# Patient Record
Sex: Female | Born: 1965 | Race: Black or African American | Hispanic: No | Marital: Single | Smoking: Former smoker
Health system: Southern US, Community
[De-identification: ages and names within clinical notes are randomized; demographics above are authoritative.]

## PROBLEM LIST (undated history)

## (undated) DIAGNOSIS — R7881 Bacteremia: Secondary | ICD-10-CM

## (undated) DIAGNOSIS — M797 Fibromyalgia: Secondary | ICD-10-CM

## (undated) DIAGNOSIS — M4802 Spinal stenosis, cervical region: Secondary | ICD-10-CM

## (undated) DIAGNOSIS — I509 Heart failure, unspecified: Secondary | ICD-10-CM

## (undated) DIAGNOSIS — Z8719 Personal history of other diseases of the digestive system: Secondary | ICD-10-CM

## (undated) DIAGNOSIS — K219 Gastro-esophageal reflux disease without esophagitis: Secondary | ICD-10-CM

## (undated) DIAGNOSIS — I4891 Unspecified atrial fibrillation: Secondary | ICD-10-CM

## (undated) DIAGNOSIS — D509 Iron deficiency anemia, unspecified: Secondary | ICD-10-CM

## (undated) DIAGNOSIS — Z9289 Personal history of other medical treatment: Secondary | ICD-10-CM

## (undated) DIAGNOSIS — I499 Cardiac arrhythmia, unspecified: Secondary | ICD-10-CM

## (undated) DIAGNOSIS — F329 Major depressive disorder, single episode, unspecified: Secondary | ICD-10-CM

## (undated) DIAGNOSIS — H5440 Blindness, one eye, unspecified eye: Secondary | ICD-10-CM

## (undated) DIAGNOSIS — A6000 Herpesviral infection of urogenital system, unspecified: Secondary | ICD-10-CM

## (undated) DIAGNOSIS — F32A Depression, unspecified: Secondary | ICD-10-CM

## (undated) DIAGNOSIS — F419 Anxiety disorder, unspecified: Secondary | ICD-10-CM

## (undated) DIAGNOSIS — N186 End stage renal disease: Secondary | ICD-10-CM

## (undated) DIAGNOSIS — Z8669 Personal history of other diseases of the nervous system and sense organs: Secondary | ICD-10-CM

## (undated) DIAGNOSIS — M545 Low back pain, unspecified: Secondary | ICD-10-CM

## (undated) DIAGNOSIS — H409 Unspecified glaucoma: Secondary | ICD-10-CM

## (undated) DIAGNOSIS — G8929 Other chronic pain: Secondary | ICD-10-CM

## (undated) DIAGNOSIS — M8580 Other specified disorders of bone density and structure, unspecified site: Secondary | ICD-10-CM

## (undated) DIAGNOSIS — H547 Unspecified visual loss: Secondary | ICD-10-CM

## (undated) DIAGNOSIS — J189 Pneumonia, unspecified organism: Secondary | ICD-10-CM

## (undated) DIAGNOSIS — Z8711 Personal history of peptic ulcer disease: Secondary | ICD-10-CM

## (undated) DIAGNOSIS — K3184 Gastroparesis: Secondary | ICD-10-CM

## (undated) DIAGNOSIS — T8859XA Other complications of anesthesia, initial encounter: Secondary | ICD-10-CM

## (undated) DIAGNOSIS — R51 Headache: Secondary | ICD-10-CM

## (undated) DIAGNOSIS — I639 Cerebral infarction, unspecified: Secondary | ICD-10-CM

## (undated) DIAGNOSIS — M5030 Other cervical disc degeneration, unspecified cervical region: Secondary | ICD-10-CM

## (undated) DIAGNOSIS — T4145XA Adverse effect of unspecified anesthetic, initial encounter: Secondary | ICD-10-CM

## (undated) DIAGNOSIS — R569 Unspecified convulsions: Secondary | ICD-10-CM

## (undated) HISTORY — PX: APPENDECTOMY: SHX54

## (undated) HISTORY — PX: KIDNEY TRANSPLANT: SHX239

## (undated) HISTORY — DX: Unspecified visual loss: H54.7

## (undated) HISTORY — DX: Other cervical disc degeneration, unspecified cervical region: M50.30

## (undated) HISTORY — PX: COLONOSCOPY: SHX174

## (undated) HISTORY — DX: Heart failure, unspecified: I50.9

## (undated) HISTORY — PX: TONSILLECTOMY: SUR1361

## (undated) HISTORY — PX: TOTAL NEPHRECTOMY: SHX415

## (undated) HISTORY — PX: CATARACT EXTRACTION: SUR2

## (undated) HISTORY — PX: MULTIPLE TOOTH EXTRACTIONS: SHX2053

## (undated) HISTORY — DX: Gastroparesis: K31.84

---
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
## 1992-02-03 DIAGNOSIS — A6000 Herpesviral infection of urogenital system, unspecified: Secondary | ICD-10-CM

## 1992-02-03 DIAGNOSIS — R569 Unspecified convulsions: Secondary | ICD-10-CM

## 1992-02-03 HISTORY — DX: Unspecified convulsions: R56.9

## 1992-02-03 HISTORY — DX: Herpesviral infection of urogenital system, unspecified: A60.00

## 1999-02-03 HISTORY — PX: ENUCLEATION: SHX628

## 1999-03-24 ENCOUNTER — Emergency Department (HOSPITAL_COMMUNITY): Admission: EM | Admit: 1999-03-24 | Discharge: 1999-03-24 | Payer: Self-pay | Admitting: Emergency Medicine

## 1999-04-13 ENCOUNTER — Encounter: Payer: Self-pay | Admitting: Emergency Medicine

## 1999-04-13 ENCOUNTER — Emergency Department (HOSPITAL_COMMUNITY): Admission: EM | Admit: 1999-04-13 | Discharge: 1999-04-13 | Payer: Self-pay | Admitting: Emergency Medicine

## 1999-05-02 ENCOUNTER — Inpatient Hospital Stay (HOSPITAL_COMMUNITY): Admission: EM | Admit: 1999-05-02 | Discharge: 1999-05-06 | Payer: Self-pay | Admitting: Emergency Medicine

## 1999-05-02 ENCOUNTER — Encounter: Payer: Self-pay | Admitting: Emergency Medicine

## 1999-05-13 ENCOUNTER — Encounter: Payer: Self-pay | Admitting: Emergency Medicine

## 1999-05-13 ENCOUNTER — Emergency Department (HOSPITAL_COMMUNITY): Admission: EM | Admit: 1999-05-13 | Discharge: 1999-05-13 | Payer: Self-pay

## 1999-05-17 ENCOUNTER — Ambulatory Visit (HOSPITAL_COMMUNITY): Admission: RE | Admit: 1999-05-17 | Discharge: 1999-05-17 | Payer: Self-pay | Admitting: *Deleted

## 1999-05-30 ENCOUNTER — Inpatient Hospital Stay (HOSPITAL_COMMUNITY): Admission: EM | Admit: 1999-05-30 | Discharge: 1999-06-11 | Payer: Self-pay | Admitting: *Deleted

## 1999-05-30 ENCOUNTER — Encounter (INDEPENDENT_AMBULATORY_CARE_PROVIDER_SITE_OTHER): Payer: Self-pay | Admitting: *Deleted

## 1999-06-06 ENCOUNTER — Encounter: Payer: Self-pay | Admitting: Nephrology

## 1999-06-10 ENCOUNTER — Encounter: Payer: Self-pay | Admitting: Nephrology

## 1999-06-13 ENCOUNTER — Emergency Department (HOSPITAL_COMMUNITY): Admission: EM | Admit: 1999-06-13 | Discharge: 1999-06-13 | Payer: Self-pay | Admitting: Emergency Medicine

## 1999-07-10 ENCOUNTER — Encounter: Payer: Self-pay | Admitting: Ophthalmology

## 1999-07-10 ENCOUNTER — Ambulatory Visit (HOSPITAL_COMMUNITY): Admission: RE | Admit: 1999-07-10 | Discharge: 1999-07-11 | Payer: Self-pay | Admitting: Ophthalmology

## 1999-09-30 ENCOUNTER — Emergency Department (HOSPITAL_COMMUNITY): Admission: EM | Admit: 1999-09-30 | Discharge: 1999-10-01 | Payer: Self-pay

## 1999-10-15 ENCOUNTER — Other Ambulatory Visit: Admission: RE | Admit: 1999-10-15 | Discharge: 1999-10-15 | Payer: Self-pay | Admitting: *Deleted

## 1999-11-04 ENCOUNTER — Ambulatory Visit (HOSPITAL_COMMUNITY): Admission: RE | Admit: 1999-11-04 | Discharge: 1999-11-04 | Payer: Self-pay | Admitting: Nephrology

## 1999-11-11 ENCOUNTER — Ambulatory Visit (HOSPITAL_COMMUNITY): Admission: RE | Admit: 1999-11-11 | Discharge: 1999-11-11 | Payer: Self-pay | Admitting: Nephrology

## 1999-11-12 ENCOUNTER — Encounter: Payer: Self-pay | Admitting: Nephrology

## 1999-11-12 ENCOUNTER — Ambulatory Visit (HOSPITAL_COMMUNITY): Admission: RE | Admit: 1999-11-12 | Discharge: 1999-11-12 | Payer: Self-pay | Admitting: Nephrology

## 1999-11-13 ENCOUNTER — Emergency Department (HOSPITAL_COMMUNITY): Admission: EM | Admit: 1999-11-13 | Discharge: 1999-11-13 | Payer: Self-pay | Admitting: Emergency Medicine

## 1999-12-23 ENCOUNTER — Ambulatory Visit (HOSPITAL_COMMUNITY): Admission: RE | Admit: 1999-12-23 | Discharge: 1999-12-23 | Payer: Self-pay | Admitting: Nephrology

## 2000-01-06 ENCOUNTER — Ambulatory Visit (HOSPITAL_COMMUNITY): Admission: RE | Admit: 2000-01-06 | Discharge: 2000-01-06 | Payer: Self-pay | Admitting: Nephrology

## 2000-01-07 ENCOUNTER — Ambulatory Visit (HOSPITAL_COMMUNITY): Admission: RE | Admit: 2000-01-07 | Discharge: 2000-01-07 | Payer: Self-pay | Admitting: *Deleted

## 2000-01-07 ENCOUNTER — Encounter (HOSPITAL_COMMUNITY): Admission: RE | Admit: 2000-01-07 | Discharge: 2000-04-06 | Payer: Self-pay | Admitting: *Deleted

## 2000-01-08 ENCOUNTER — Ambulatory Visit (HOSPITAL_COMMUNITY): Admission: RE | Admit: 2000-01-08 | Discharge: 2000-01-08 | Payer: Self-pay | Admitting: *Deleted

## 2000-01-09 ENCOUNTER — Emergency Department (HOSPITAL_COMMUNITY): Admission: EM | Admit: 2000-01-09 | Discharge: 2000-01-10 | Payer: Self-pay | Admitting: Emergency Medicine

## 2000-01-09 ENCOUNTER — Emergency Department (HOSPITAL_COMMUNITY): Admission: EM | Admit: 2000-01-09 | Discharge: 2000-01-09 | Payer: Self-pay | Admitting: Emergency Medicine

## 2000-02-17 ENCOUNTER — Ambulatory Visit (HOSPITAL_COMMUNITY): Admission: RE | Admit: 2000-02-17 | Discharge: 2000-02-17 | Payer: Self-pay | Admitting: *Deleted

## 2000-02-18 ENCOUNTER — Ambulatory Visit (HOSPITAL_COMMUNITY): Admission: RE | Admit: 2000-02-18 | Discharge: 2000-02-18 | Payer: Self-pay | Admitting: *Deleted

## 2000-02-18 ENCOUNTER — Encounter: Payer: Self-pay | Admitting: *Deleted

## 2000-04-21 ENCOUNTER — Ambulatory Visit (HOSPITAL_COMMUNITY): Admission: RE | Admit: 2000-04-21 | Discharge: 2000-04-21 | Payer: Self-pay | Admitting: Cardiology

## 2000-04-21 ENCOUNTER — Encounter: Payer: Self-pay | Admitting: Cardiology

## 2000-06-01 ENCOUNTER — Ambulatory Visit (HOSPITAL_COMMUNITY): Admission: RE | Admit: 2000-06-01 | Discharge: 2000-06-01 | Payer: Self-pay | Admitting: Nephrology

## 2000-06-08 ENCOUNTER — Encounter: Admission: RE | Admit: 2000-06-08 | Discharge: 2000-06-08 | Payer: Self-pay | Admitting: *Deleted

## 2000-07-05 ENCOUNTER — Ambulatory Visit (HOSPITAL_COMMUNITY): Admission: RE | Admit: 2000-07-05 | Discharge: 2000-07-05 | Payer: Self-pay | Admitting: Nephrology

## 2000-07-07 ENCOUNTER — Encounter: Payer: Self-pay | Admitting: Nephrology

## 2000-07-07 ENCOUNTER — Ambulatory Visit (HOSPITAL_COMMUNITY): Admission: RE | Admit: 2000-07-07 | Discharge: 2000-07-07 | Payer: Self-pay | Admitting: Nephrology

## 2000-08-17 ENCOUNTER — Encounter: Admission: RE | Admit: 2000-08-17 | Discharge: 2000-08-17 | Payer: Self-pay | Admitting: Nephrology

## 2000-08-17 ENCOUNTER — Encounter: Payer: Self-pay | Admitting: Nephrology

## 2000-09-14 ENCOUNTER — Encounter (HOSPITAL_COMMUNITY): Admission: RE | Admit: 2000-09-14 | Discharge: 2000-12-13 | Payer: Self-pay | Admitting: Nephrology

## 2000-09-21 ENCOUNTER — Ambulatory Visit (HOSPITAL_COMMUNITY): Admission: RE | Admit: 2000-09-21 | Discharge: 2000-09-21 | Payer: Self-pay | Admitting: Nephrology

## 2000-09-21 ENCOUNTER — Encounter: Payer: Self-pay | Admitting: Nephrology

## 2000-10-09 ENCOUNTER — Emergency Department (HOSPITAL_COMMUNITY): Admission: EM | Admit: 2000-10-09 | Discharge: 2000-10-10 | Payer: Self-pay | Admitting: Emergency Medicine

## 2000-10-13 ENCOUNTER — Encounter: Payer: Self-pay | Admitting: Emergency Medicine

## 2000-10-13 ENCOUNTER — Inpatient Hospital Stay (HOSPITAL_COMMUNITY): Admission: EM | Admit: 2000-10-13 | Discharge: 2000-10-15 | Payer: Self-pay | Admitting: Emergency Medicine

## 2000-10-13 ENCOUNTER — Ambulatory Visit (HOSPITAL_COMMUNITY): Admission: RE | Admit: 2000-10-13 | Discharge: 2000-10-13 | Payer: Self-pay | Admitting: Critical Care Medicine

## 2000-10-14 ENCOUNTER — Encounter: Payer: Self-pay | Admitting: Nephrology

## 2000-10-29 ENCOUNTER — Encounter: Payer: Self-pay | Admitting: Emergency Medicine

## 2000-10-29 ENCOUNTER — Emergency Department (HOSPITAL_COMMUNITY): Admission: EM | Admit: 2000-10-29 | Discharge: 2000-10-29 | Payer: Self-pay | Admitting: Emergency Medicine

## 2000-10-30 ENCOUNTER — Emergency Department (HOSPITAL_COMMUNITY): Admission: EM | Admit: 2000-10-30 | Discharge: 2000-10-30 | Payer: Self-pay | Admitting: Emergency Medicine

## 2000-11-04 ENCOUNTER — Encounter: Payer: Self-pay | Admitting: Nephrology

## 2000-11-04 ENCOUNTER — Inpatient Hospital Stay (HOSPITAL_COMMUNITY): Admission: AD | Admit: 2000-11-04 | Discharge: 2000-11-09 | Payer: Self-pay | Admitting: Nephrology

## 2000-11-05 ENCOUNTER — Encounter: Payer: Self-pay | Admitting: Nephrology

## 2000-11-07 ENCOUNTER — Encounter: Payer: Self-pay | Admitting: Nephrology

## 2000-11-09 ENCOUNTER — Encounter: Payer: Self-pay | Admitting: Nephrology

## 2000-12-21 ENCOUNTER — Emergency Department (HOSPITAL_COMMUNITY): Admission: EM | Admit: 2000-12-21 | Discharge: 2000-12-22 | Payer: Self-pay | Admitting: Emergency Medicine

## 2001-03-04 ENCOUNTER — Ambulatory Visit (HOSPITAL_COMMUNITY): Admission: RE | Admit: 2001-03-04 | Discharge: 2001-03-04 | Payer: Self-pay | Admitting: Nephrology

## 2001-03-04 ENCOUNTER — Encounter (HOSPITAL_COMMUNITY): Admission: RE | Admit: 2001-03-04 | Discharge: 2001-06-02 | Payer: Self-pay | Admitting: Nephrology

## 2001-03-21 ENCOUNTER — Ambulatory Visit (HOSPITAL_COMMUNITY): Admission: RE | Admit: 2001-03-21 | Discharge: 2001-03-21 | Payer: Self-pay | Admitting: Nephrology

## 2001-03-21 ENCOUNTER — Encounter: Payer: Self-pay | Admitting: Nephrology

## 2001-07-30 ENCOUNTER — Inpatient Hospital Stay (HOSPITAL_COMMUNITY): Admission: AD | Admit: 2001-07-30 | Discharge: 2001-08-01 | Payer: Self-pay | Admitting: Nephrology

## 2001-08-23 IMAGING — XA IR [PERSON_NAME]/SVC
4 series · 14 of 24 positions shown · IV contrast (omnipaque)
Comparison: none

FINDINGS
CLINICAL DATA: 33-YEAR-OLD WITH END STAGE RENAL DISEASE AND EXCEEDINGLY POOR DIALYSIS ACCESS.  DR.
MUMN INITIALLY ATTEMPTED TO PLACE A RIGHT JUGULAR DIALYSIS CATHETER.  WE WERE THEN REQUESTED
TO PERFORM CENTRAL VENOGRAPHY AND PLACE A DIALYSIS CATHETER.
FLUORO GUIDANCE:
FLUOROSCOPY WAS PROVIDED TO DR. MUMN IN THE ATTEMPTED PLACEMENT OF THE RIGHT JUGULAR DIALYSIS
CATHETER.  HE OBTAINED SEVERAL SPOT FILM IMAGES DURING PLACEMENT WHICH INITIALLY DEMONSTRATED A
SMALL AMOUNT OF EXTRAVASATION INTO THE MEDIASTINUM.  HE ALSO SUBMITTED IMAGES WITH HIS DILATOR TIP
IN WHAT APPEARS TO BE AN AZYGOUS COLLATERAL.
IMPRESSION
FLUOROSCOPY FOR ATTEMPTED DIALYSIS CATHETER PLACEMENT.  SPOT FILM IMAGES DEMONSTRATE THE DILATOR IN
WHAT APPEARS TO BE AN AZYGOUS COLLATERAL, SUGGESTING SUPERIOR VENA CAVA OCCLUSION.
SUPERIOR VENA CAVOGRAPHY:
THAT THE RIGHT INTERNAL JUGULAR VEIN WAS INDEED PATENT.  USING THE USUAL STERILE TECHNIQUE AND 1%
LIDOCAINE AS LOCAL ANESTHETIC, THE RIGHT INTERNAL JUGULAR VEIN WAS PUNCTURED UNDER DIRECT
ULTRASOUND VISUALIZATION.  THE GUIDE WIRE PREFERENTIALLY WENT CEPHALAD IN THE RIGHT INTERNAL
JUGULAR VEIN.  I USED THE 3 FRENCH INNER DILATOR FROM THE MICROPUNCTURE TRANSITIONAL DILATOR AND
PLACED THIS INTO THE RIGHT INTERNAL JUGULAR VEIN.  DIGITAL SUBTRACTION SUPERIOR VENA CAVOGRAPHY WAS
PERFORMED DURING INJECTION OF APPROXIMATELY 12 CC OF OMNIPAQUE 300.
THE SUPERIOR VENA CAVA IS INDEED OCCLUDED AT THE UPPER END OF THE PATIENT'S SUPERIOR VENA CAVAL
STENT.  MULTIPLE AZYGOUS AND HEMIAZYGOUS COLLATERALS FILL.  THE RIGHT INNOMINATE VEIN ITSELF IS
PATENT.  I DO NOT IDENTIFY THE LEFT INNOMINATE VEIN.

[Series 1: run · 5 of 22 slices shown (1 of 4)]
[im 1/22]
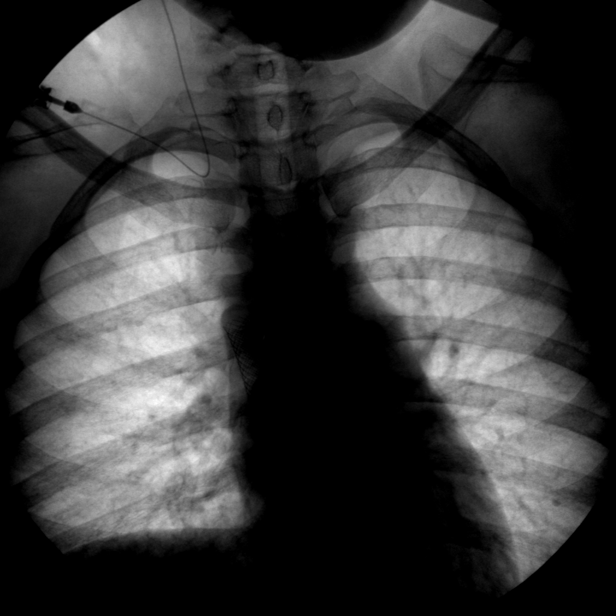
[im 6/22]
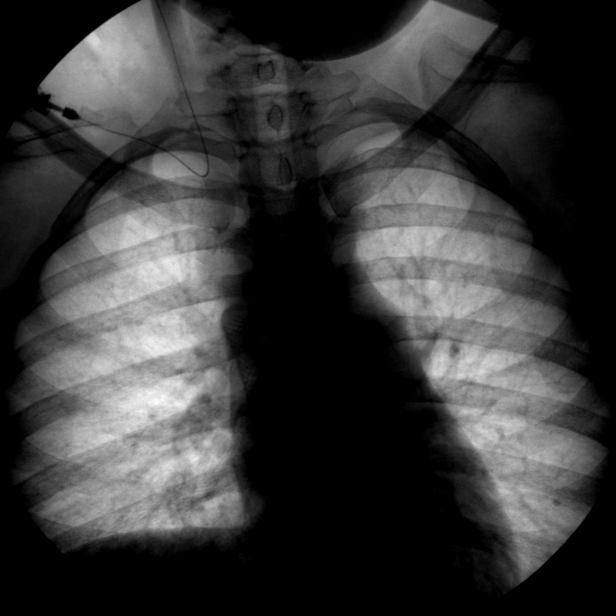
[im 11/22]
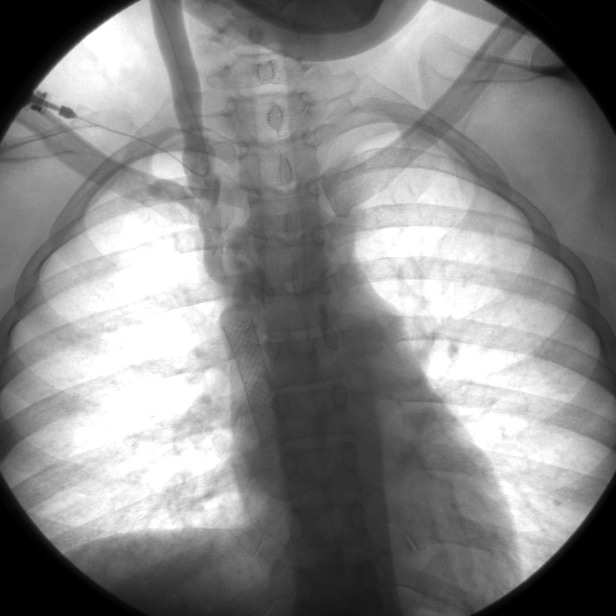
[im 16/22]
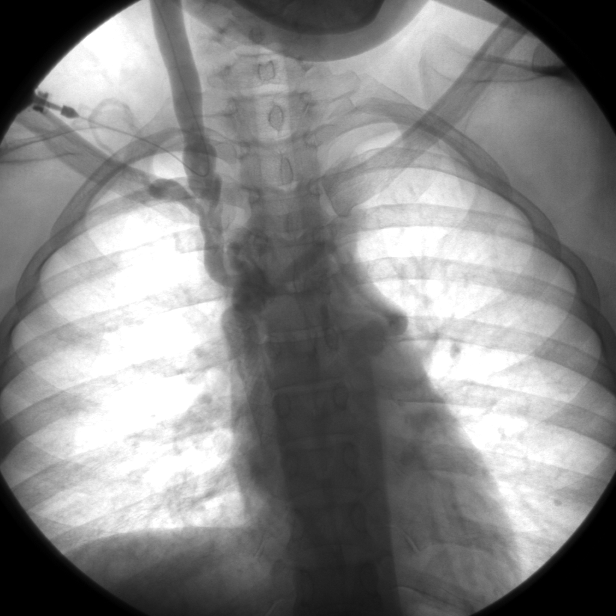
[im 19/22]
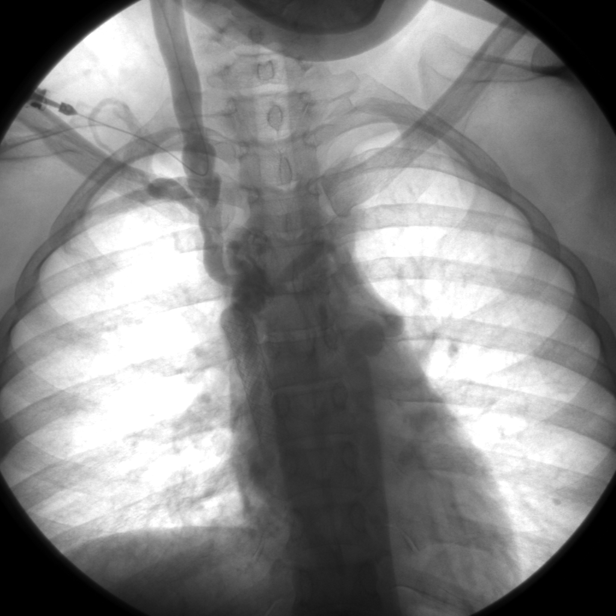

[Series 2: run · 4 of 15 slices shown (2 of 4)]
[im 1/15]
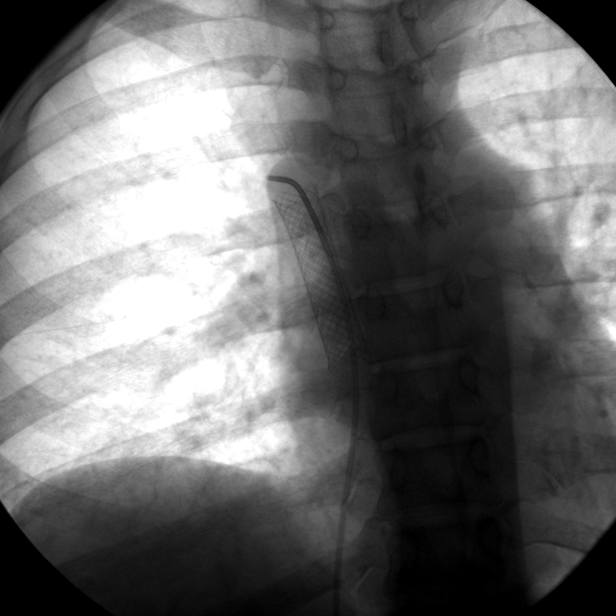
[im 6/15]
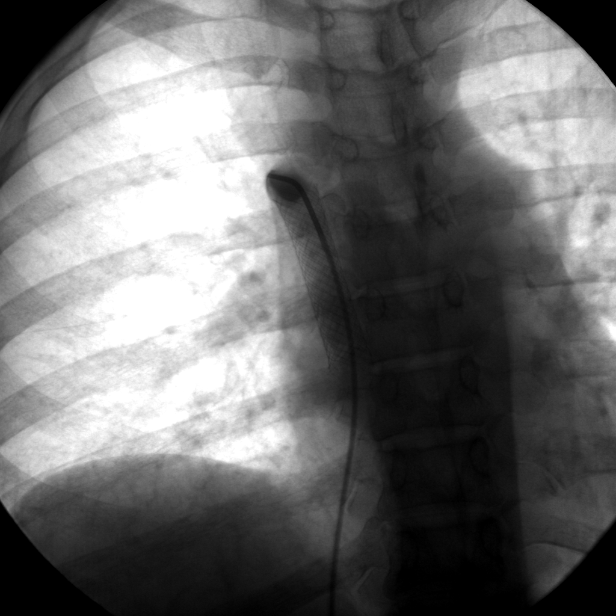
[im 9/15]
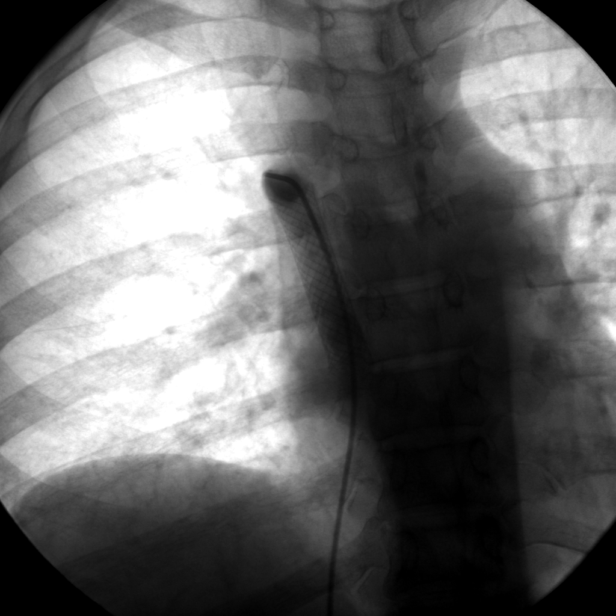
[im 15/15]
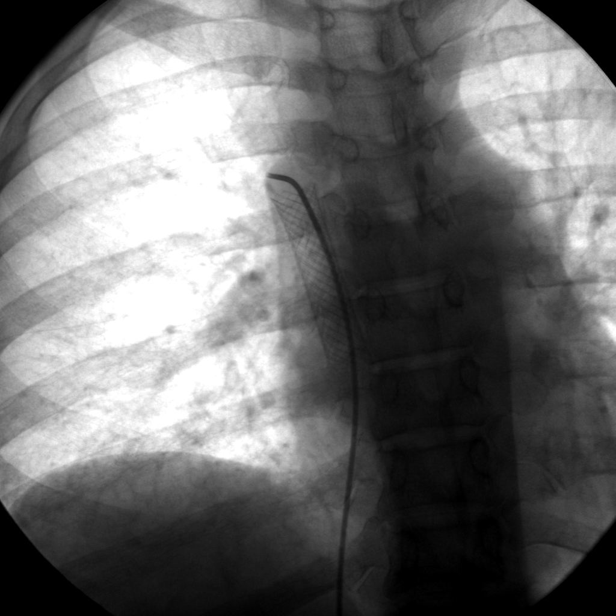

[Series 3: run · 4 of 18 slices shown (3 of 4)]
[im 3/18]
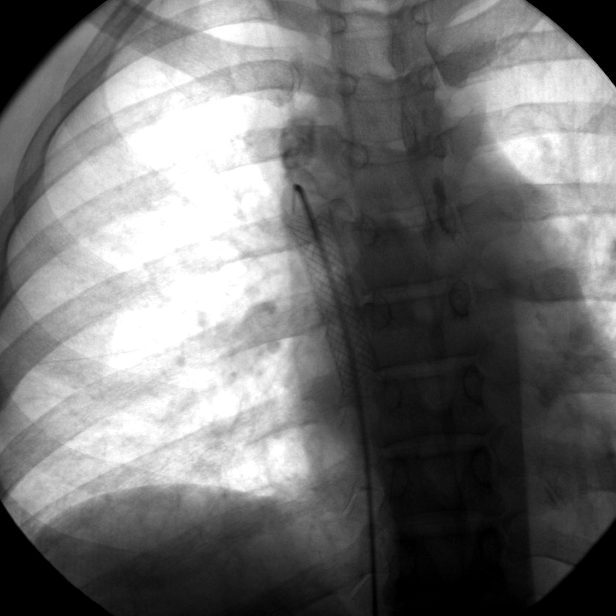
[im 8/18]
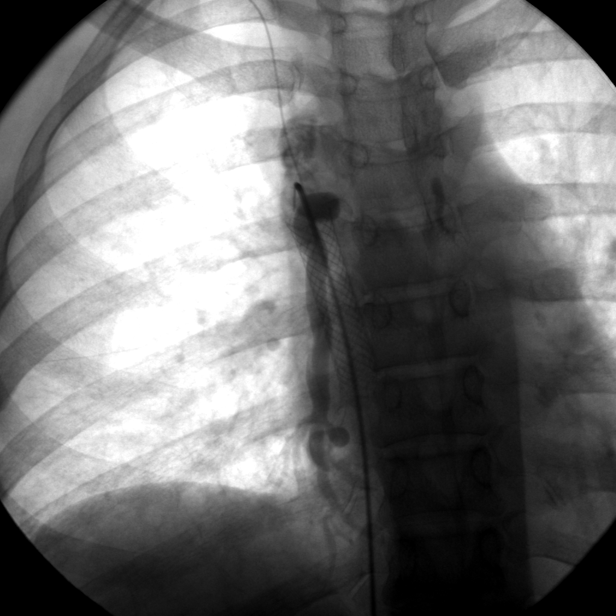
[im 10/18]
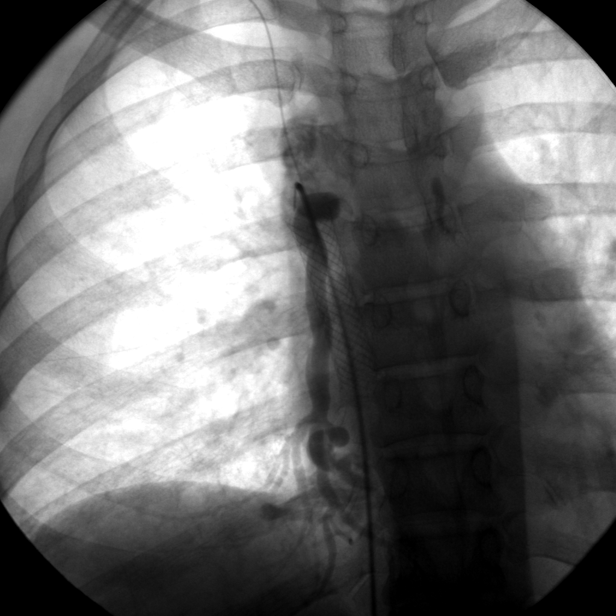
[im 15/18]
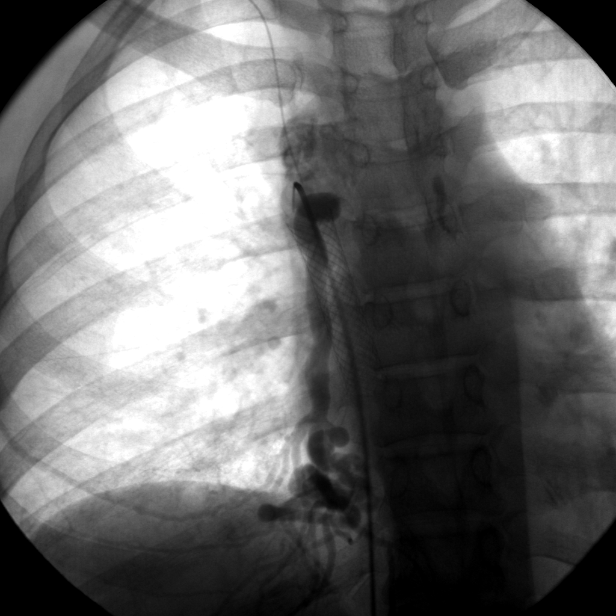

[Series 7: run · 1 of 1 slices shown (4 of 4)]
[im 1/1]
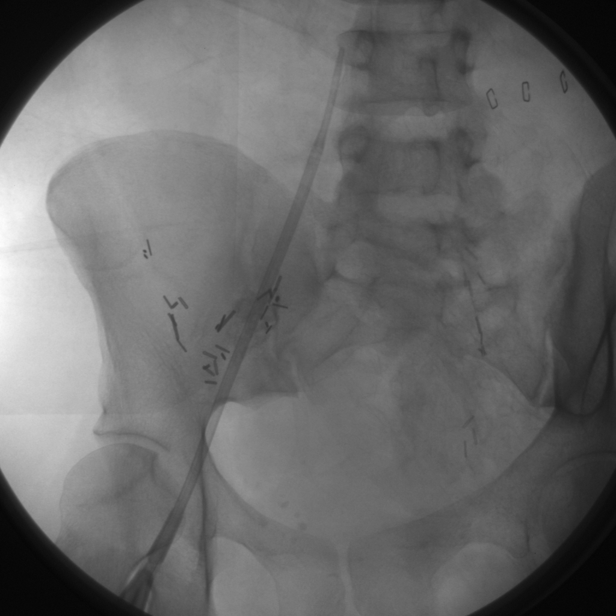

[14 of 24 positions shown; findings below may reference images not displayed]

IMPRESSION: 1.  SUPERIOR VENA CAVA OCCLUSION AT THE LEVEL OF THE UPPER END OF THE CAVAL STENT.
2.  DR. ANNAMMA PLACED A TEMPORARY DIALYSIS CATHETER FROM THE RIGHT COMMON FEMORAL VEIN APPROACH; HE
WILL DICTATE THIS SEPARATELY.

## 2001-09-01 ENCOUNTER — Other Ambulatory Visit: Admission: RE | Admit: 2001-09-01 | Discharge: 2001-09-01 | Payer: Self-pay | Admitting: Obstetrics and Gynecology

## 2001-10-05 ENCOUNTER — Ambulatory Visit (HOSPITAL_COMMUNITY): Admission: RE | Admit: 2001-10-05 | Discharge: 2001-10-05 | Payer: Self-pay | Admitting: Nephrology

## 2001-10-26 ENCOUNTER — Encounter: Payer: Self-pay | Admitting: Nephrology

## 2001-10-26 ENCOUNTER — Encounter: Admission: RE | Admit: 2001-10-26 | Discharge: 2001-10-26 | Payer: Self-pay | Admitting: Nephrology

## 2001-11-16 ENCOUNTER — Ambulatory Visit (HOSPITAL_COMMUNITY): Admission: RE | Admit: 2001-11-16 | Discharge: 2001-11-16 | Payer: Self-pay | Admitting: Nephrology

## 2001-11-21 ENCOUNTER — Encounter: Admission: RE | Admit: 2001-11-21 | Discharge: 2002-01-20 | Payer: Self-pay | Admitting: Nephrology

## 2001-11-30 ENCOUNTER — Ambulatory Visit (HOSPITAL_COMMUNITY): Admission: RE | Admit: 2001-11-30 | Discharge: 2001-11-30 | Payer: Self-pay | Admitting: Nephrology

## 2001-12-21 ENCOUNTER — Ambulatory Visit (HOSPITAL_COMMUNITY): Admission: RE | Admit: 2001-12-21 | Discharge: 2001-12-21 | Payer: Self-pay | Admitting: Nephrology

## 2001-12-21 ENCOUNTER — Encounter: Payer: Self-pay | Admitting: Nephrology

## 2001-12-25 ENCOUNTER — Encounter: Payer: Self-pay | Admitting: Emergency Medicine

## 2001-12-25 ENCOUNTER — Emergency Department (HOSPITAL_COMMUNITY): Admission: EM | Admit: 2001-12-25 | Discharge: 2001-12-25 | Payer: Self-pay | Admitting: Emergency Medicine

## 2002-01-16 ENCOUNTER — Ambulatory Visit (HOSPITAL_COMMUNITY): Admission: RE | Admit: 2002-01-16 | Discharge: 2002-01-16 | Payer: Self-pay | Admitting: Cardiology

## 2002-01-16 ENCOUNTER — Encounter: Payer: Self-pay | Admitting: Cardiology

## 2002-04-08 ENCOUNTER — Emergency Department (HOSPITAL_COMMUNITY): Admission: EM | Admit: 2002-04-08 | Discharge: 2002-04-08 | Payer: Self-pay | Admitting: Emergency Medicine

## 2002-06-07 ENCOUNTER — Encounter (HOSPITAL_COMMUNITY): Admission: RE | Admit: 2002-06-07 | Discharge: 2002-09-05 | Payer: Self-pay | Admitting: Nephrology

## 2002-08-02 ENCOUNTER — Encounter: Payer: Self-pay | Admitting: Nephrology

## 2002-08-14 ENCOUNTER — Encounter: Payer: Self-pay | Admitting: Nephrology

## 2002-08-14 ENCOUNTER — Ambulatory Visit (HOSPITAL_COMMUNITY): Admission: RE | Admit: 2002-08-14 | Discharge: 2002-08-14 | Payer: Self-pay | Admitting: Nephrology

## 2002-08-21 ENCOUNTER — Encounter: Payer: Self-pay | Admitting: Emergency Medicine

## 2002-08-21 ENCOUNTER — Inpatient Hospital Stay (HOSPITAL_COMMUNITY): Admission: EM | Admit: 2002-08-21 | Discharge: 2002-08-25 | Payer: Self-pay | Admitting: Emergency Medicine

## 2002-08-21 ENCOUNTER — Encounter: Payer: Self-pay | Admitting: Nephrology

## 2002-08-22 ENCOUNTER — Encounter (INDEPENDENT_AMBULATORY_CARE_PROVIDER_SITE_OTHER): Payer: Self-pay | Admitting: Specialist

## 2002-09-04 ENCOUNTER — Encounter: Payer: Self-pay | Admitting: Nephrology

## 2002-09-04 ENCOUNTER — Ambulatory Visit (HOSPITAL_COMMUNITY): Admission: RE | Admit: 2002-09-04 | Discharge: 2002-09-04 | Payer: Self-pay | Admitting: Nephrology

## 2002-09-06 ENCOUNTER — Encounter: Admission: RE | Admit: 2002-09-06 | Discharge: 2002-10-04 | Payer: Self-pay

## 2002-09-25 ENCOUNTER — Other Ambulatory Visit: Admission: RE | Admit: 2002-09-25 | Discharge: 2002-09-25 | Payer: Self-pay | Admitting: Obstetrics and Gynecology

## 2002-09-29 ENCOUNTER — Ambulatory Visit (HOSPITAL_COMMUNITY): Admission: RE | Admit: 2002-09-29 | Discharge: 2002-09-29 | Payer: Self-pay | Admitting: Nephrology

## 2002-10-06 ENCOUNTER — Encounter (HOSPITAL_COMMUNITY): Admission: RE | Admit: 2002-10-06 | Discharge: 2003-01-04 | Payer: Self-pay | Admitting: Nephrology

## 2002-10-13 ENCOUNTER — Encounter: Admission: RE | Admit: 2002-10-13 | Discharge: 2003-01-10 | Payer: Self-pay | Admitting: Specialist

## 2002-10-20 ENCOUNTER — Emergency Department (HOSPITAL_COMMUNITY): Admission: EM | Admit: 2002-10-20 | Discharge: 2002-10-20 | Payer: Self-pay | Admitting: Emergency Medicine

## 2002-10-20 ENCOUNTER — Encounter: Payer: Self-pay | Admitting: Emergency Medicine

## 2002-12-27 ENCOUNTER — Encounter: Admission: RE | Admit: 2002-12-27 | Discharge: 2002-12-27 | Payer: Self-pay | Admitting: Nephrology

## 2003-01-18 ENCOUNTER — Ambulatory Visit (HOSPITAL_COMMUNITY): Admission: RE | Admit: 2003-01-18 | Discharge: 2003-01-18 | Payer: Self-pay | Admitting: Nephrology

## 2003-03-02 ENCOUNTER — Ambulatory Visit (HOSPITAL_COMMUNITY): Admission: RE | Admit: 2003-03-02 | Discharge: 2003-03-02 | Payer: Self-pay | Admitting: Nephrology

## 2003-03-09 ENCOUNTER — Ambulatory Visit (HOSPITAL_COMMUNITY): Admission: RE | Admit: 2003-03-09 | Discharge: 2003-03-09 | Payer: Self-pay | Admitting: Nephrology

## 2003-04-20 ENCOUNTER — Emergency Department (HOSPITAL_COMMUNITY): Admission: EM | Admit: 2003-04-20 | Discharge: 2003-04-20 | Payer: Self-pay | Admitting: Emergency Medicine

## 2003-04-20 ENCOUNTER — Ambulatory Visit: Admission: RE | Admit: 2003-04-20 | Discharge: 2003-04-20 | Payer: Self-pay | Admitting: Nephrology

## 2003-04-21 ENCOUNTER — Emergency Department (HOSPITAL_COMMUNITY): Admission: AD | Admit: 2003-04-21 | Discharge: 2003-04-21 | Payer: Self-pay | Admitting: Family Medicine

## 2003-04-26 ENCOUNTER — Encounter: Payer: Self-pay | Admitting: Cardiology

## 2003-04-26 ENCOUNTER — Inpatient Hospital Stay (HOSPITAL_COMMUNITY): Admission: EM | Admit: 2003-04-26 | Discharge: 2003-04-29 | Payer: Self-pay | Admitting: Emergency Medicine

## 2003-05-16 ENCOUNTER — Encounter: Admission: RE | Admit: 2003-05-16 | Discharge: 2003-05-16 | Payer: Self-pay | Admitting: Obstetrics and Gynecology

## 2003-05-18 ENCOUNTER — Ambulatory Visit (HOSPITAL_COMMUNITY): Admission: RE | Admit: 2003-05-18 | Discharge: 2003-05-18 | Payer: Self-pay | Admitting: Nephrology

## 2003-05-23 ENCOUNTER — Ambulatory Visit (HOSPITAL_COMMUNITY): Admission: RE | Admit: 2003-05-23 | Discharge: 2003-05-23 | Payer: Self-pay | Admitting: Nephrology

## 2004-01-25 ENCOUNTER — Encounter (HOSPITAL_COMMUNITY): Admission: RE | Admit: 2004-01-25 | Discharge: 2004-04-24 | Payer: Self-pay | Admitting: Nephrology

## 2004-03-14 ENCOUNTER — Emergency Department (HOSPITAL_COMMUNITY): Admission: EM | Admit: 2004-03-14 | Discharge: 2004-03-14 | Payer: Self-pay | Admitting: Emergency Medicine

## 2004-03-18 ENCOUNTER — Ambulatory Visit (HOSPITAL_COMMUNITY): Admission: RE | Admit: 2004-03-18 | Discharge: 2004-03-18 | Payer: Self-pay | Admitting: Obstetrics and Gynecology

## 2004-05-02 ENCOUNTER — Encounter (HOSPITAL_COMMUNITY): Admission: RE | Admit: 2004-05-02 | Discharge: 2004-07-31 | Payer: Self-pay | Admitting: Nephrology

## 2004-05-09 ENCOUNTER — Ambulatory Visit (HOSPITAL_COMMUNITY): Admission: RE | Admit: 2004-05-09 | Discharge: 2004-05-09 | Payer: Self-pay | Admitting: Gynecologic Oncology

## 2004-05-13 ENCOUNTER — Ambulatory Visit: Admission: RE | Admit: 2004-05-13 | Discharge: 2004-05-13 | Payer: Self-pay | Admitting: Gynecologic Oncology

## 2004-08-08 ENCOUNTER — Encounter (HOSPITAL_COMMUNITY): Admission: RE | Admit: 2004-08-08 | Discharge: 2004-11-06 | Payer: Self-pay | Admitting: Nephrology

## 2004-09-14 ENCOUNTER — Emergency Department (HOSPITAL_COMMUNITY): Admission: EM | Admit: 2004-09-14 | Discharge: 2004-09-14 | Payer: Self-pay | Admitting: *Deleted

## 2004-10-07 ENCOUNTER — Ambulatory Visit: Payer: Self-pay | Admitting: Oncology

## 2004-10-14 ENCOUNTER — Encounter: Admission: RE | Admit: 2004-10-14 | Discharge: 2004-10-14 | Payer: Self-pay | Admitting: Nephrology

## 2004-10-19 IMAGING — XA IR FLUORO RM 0-60 MIN
1 series · 7 of 7 positions shown · non-contrast
Comparison: none

FINDINGS
CLINICAL DATA: END STAGE RENAL DISEASE ON HEMODIALYSIS.  POOR FLOWS THROUGH LEFT GROIN
HEMODIALYSIS ASH CATHETER.  THIS WAS EXCHANGED ON 12/21/01 AT WHICH TIME A 14 MM PTA OF THE IVC WAS
PERFORMED.
HEMODIALYSIS CATHETER INJECTION UNDER FLUOROSCOPY

[Series 1: run · 7 of 7 slices shown]
[im 1/7]
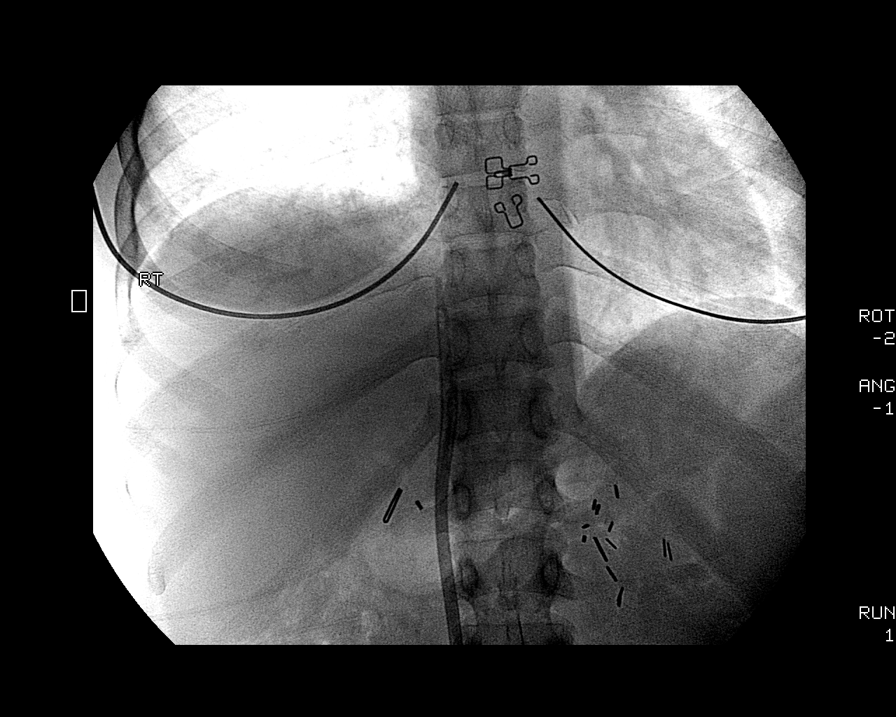
[im 2/7]
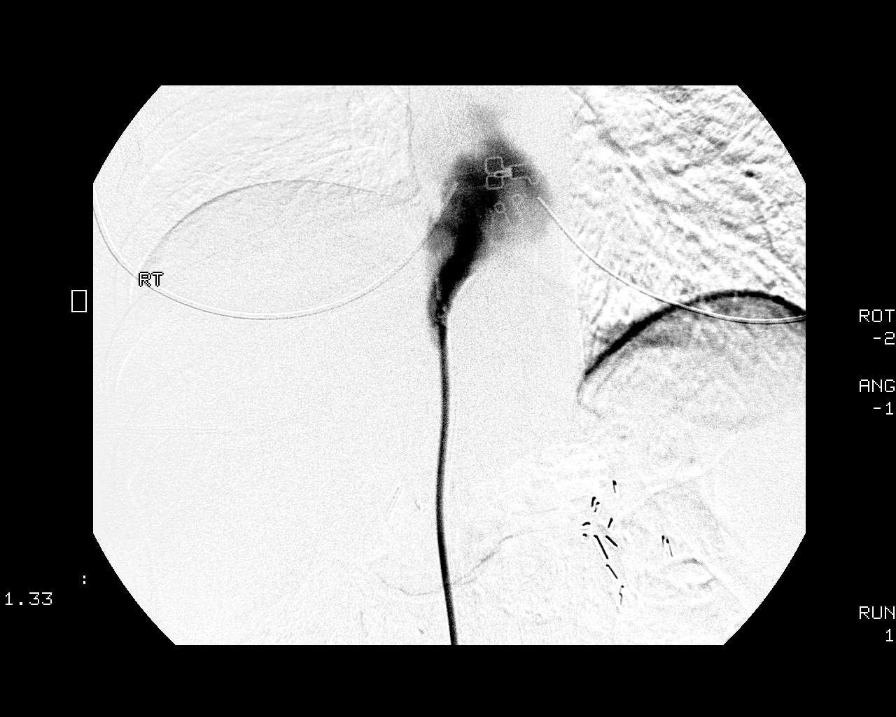
[im 3/7]
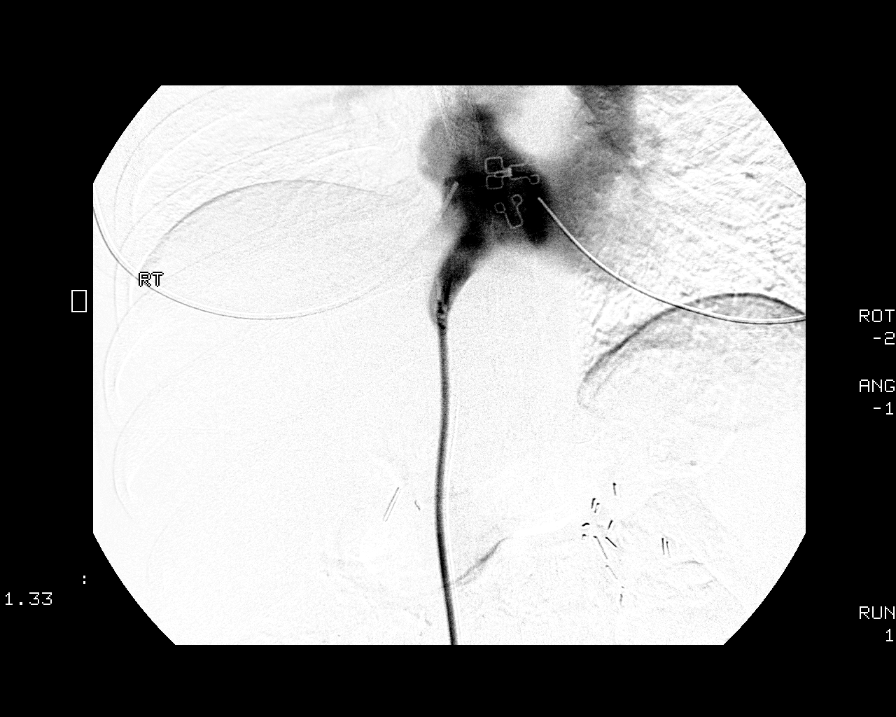
[im 4/7]
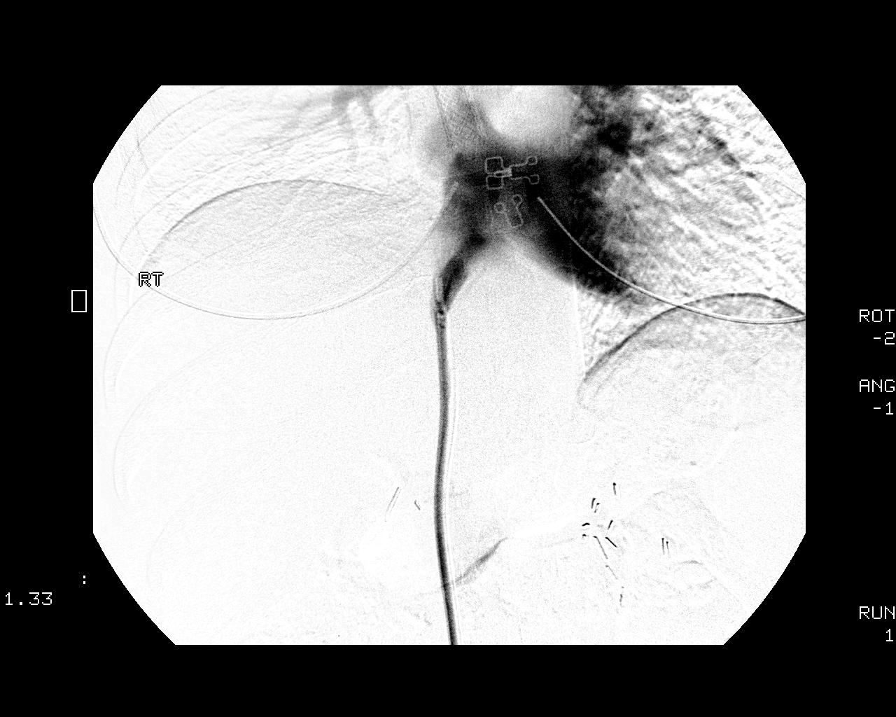
[im 5/7]
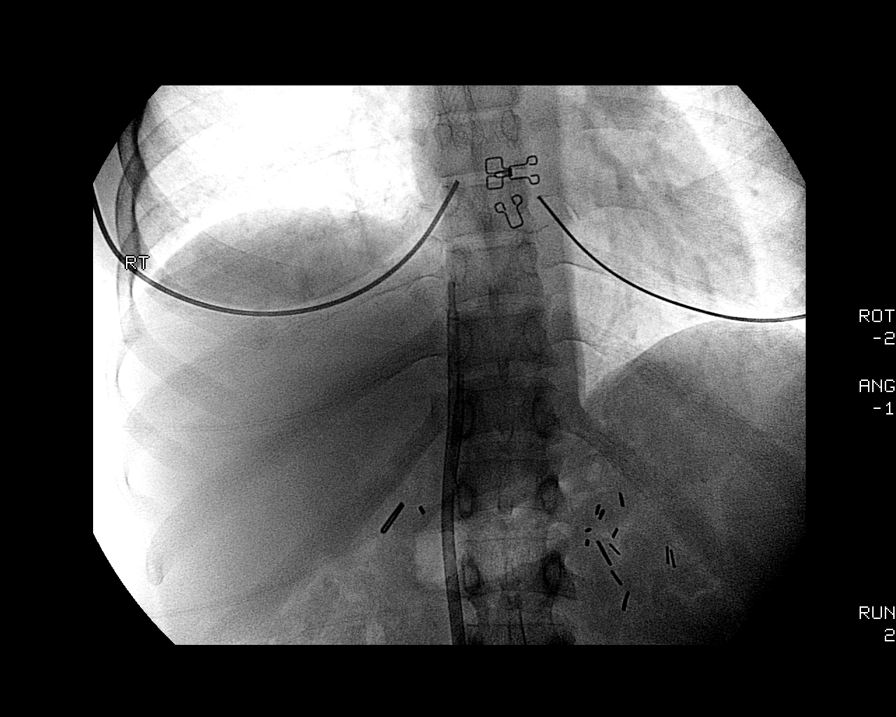
[im 6/7]
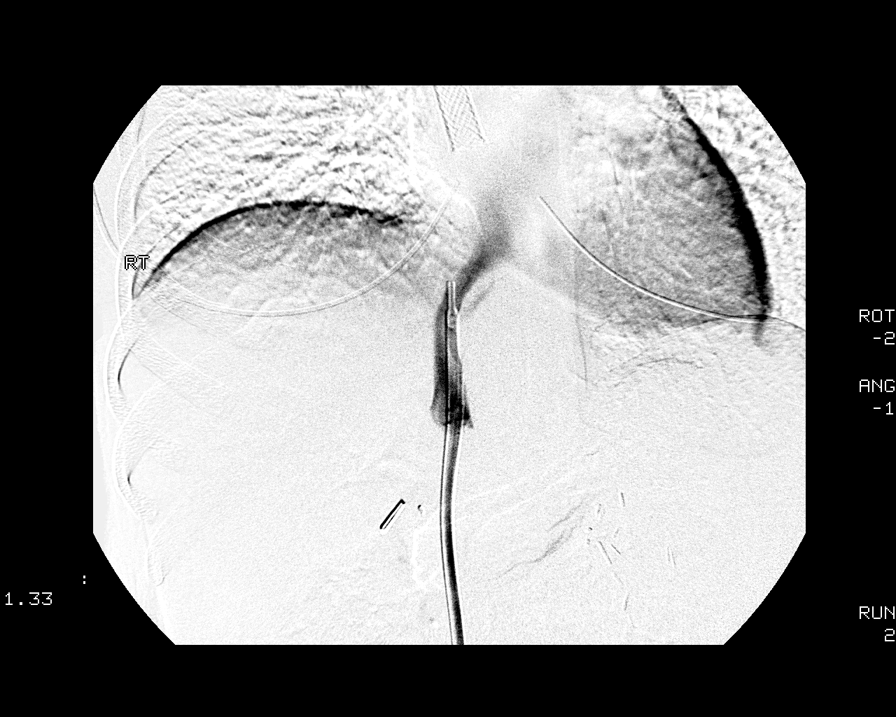
[im 7/7]
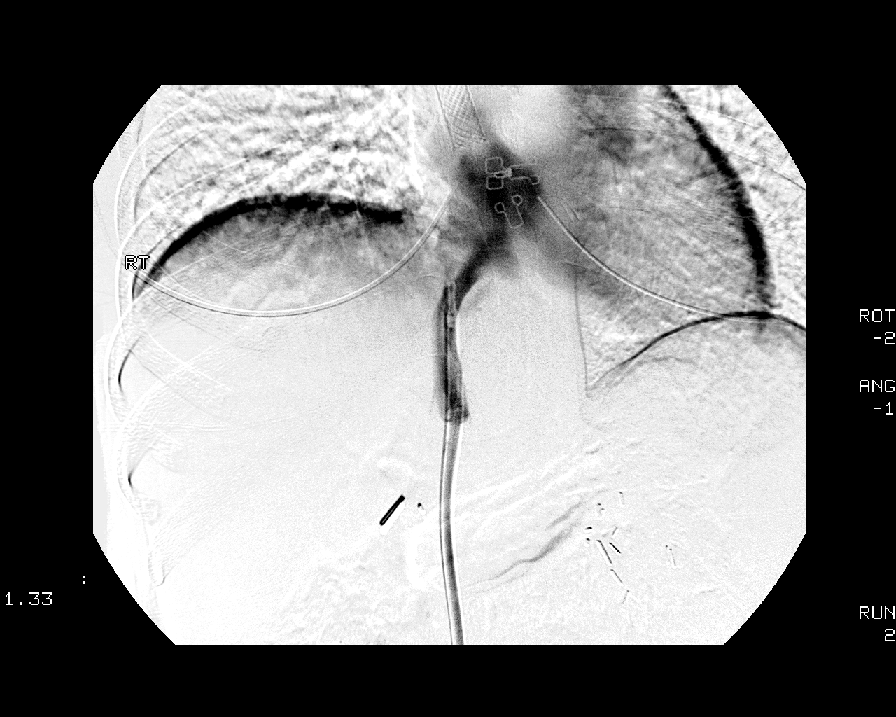

[7 of 7 positions shown; findings below may reference images not displayed]

FINDINGS: THE LEFT FEMORAL TUNNELED ASH-SPLIT HEMODIALYSIS CATHETER WAS INSPECTED FLUOROSCOPICALLY AND FOUND
TO BE IN GOOD POSITION. BOTH THE RED ASPIRATION AND BLUE VENOUS RETURN LUMENS WERE SEQUENTIALLY
INJECTED WITH WATER SOLUBLE CONTRAST UNDER FLUOROSCOPY.  THIS DEMONSTRATES PATENCY OF BOTH LUMENS
WITH NO EVIDENCE OF CAVAL THROMBOSIS OR OTHER SUPERIMPOSED COMPLICATION.  AN SVC STENT IS PARTIALLY
SEEN.
IMPRESSION
THE HEMODIALYSIS CATHETER REMAINS IN GOOD POSITION.  WE WILL PROCEED WITH A PROTOCOL TPA CATHETER
INFUSION IN HOPES OF RESTORING ADEQUATE FLOW.  IF THIS FAILS, WE SHOULD PROBABLY CONSIDER CATHETER
EXCHANGE WITH MORE FORMAL CAVOGRAM TO EVALUATE FOR RECURRENT STENOSIS.

## 2004-10-24 ENCOUNTER — Encounter: Admission: RE | Admit: 2004-10-24 | Discharge: 2004-10-24 | Payer: Self-pay

## 2004-11-07 IMAGING — CT CT ABDOMEN W/ CM
1 series · 14 of 32 positions shown, 18 images · IV contrast (RECTAL CONTRAST)
Comparison: 05/02/99.

FINDINGS
CLINICAL DATA: RIGHT LOWER QUADRANT PAIN WITH NAUSEA AND VOMITING.
CT ABDOMEN AND PELVIS WITH CONTRAST
TECHNIQUE: MULTIDETECTOR HELICAL IMAGING CARRIED OUT THROUGH THE ABDOMEN AND PELVIS UTILIZING ORAL
AND IV CONTRAST (100 CC OMNIPAQUE 300 - NON-IONIC CONTRAST USED BECAUSE OF A HISTORY OF RENAL
FAILURE - PATIENT ON DIALYSIS.
CT ABDOMEN WITH CONTRAST

[Series 2: abd pelvis · axial · 0.61mm/px · z∈[-255,-50]mm · 14 of 46 slices shown, 18 images]
[im 3/46  soft-tissue]
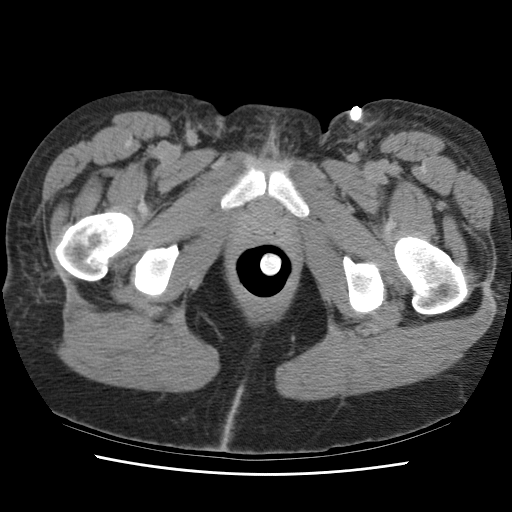
[im 3/46  bone]
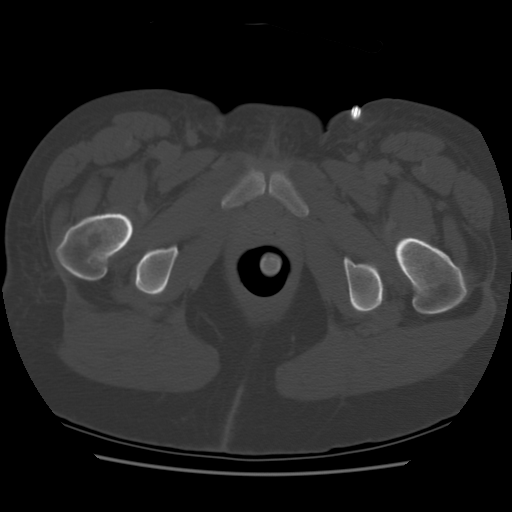
[im 6/46  soft-tissue]
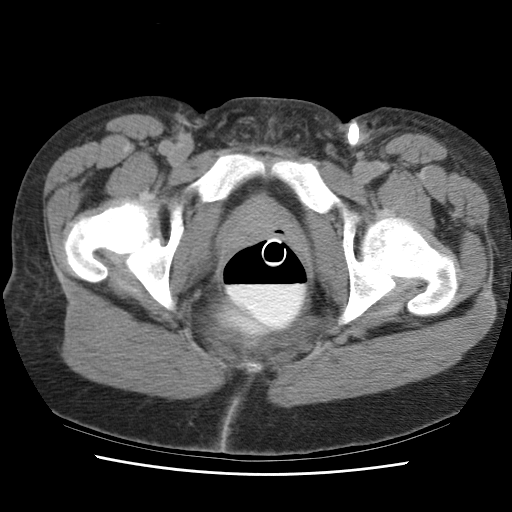
[im 11/46  soft-tissue]
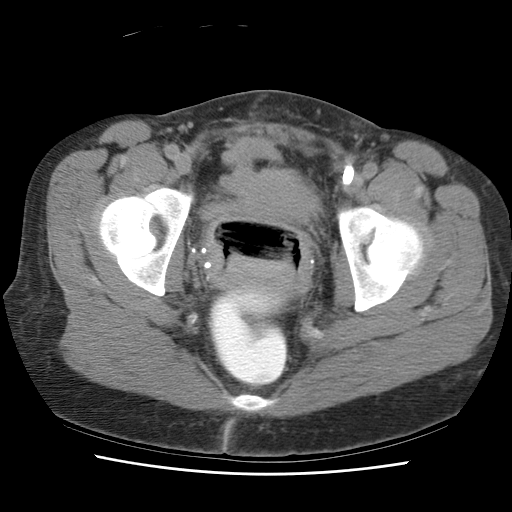
[im 14/46  soft-tissue]
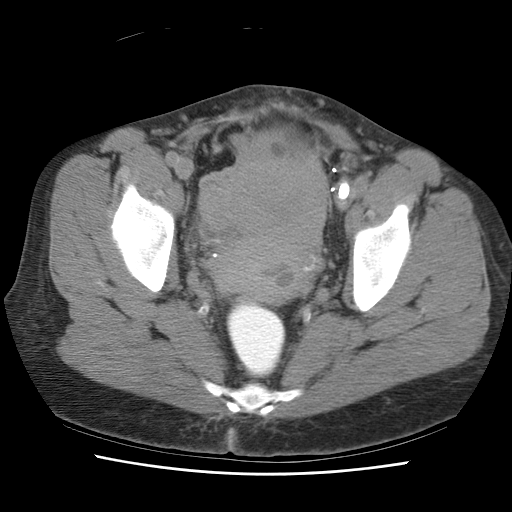
[im 18/46  soft-tissue]
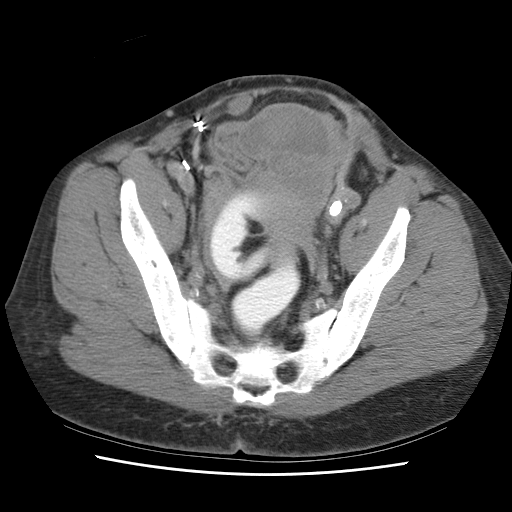
[im 21/46  soft-tissue]
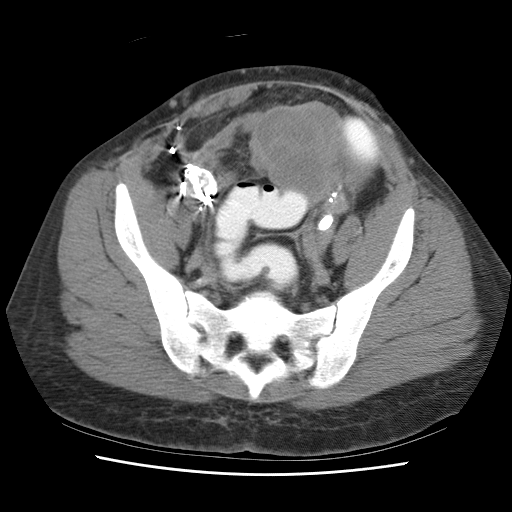
[im 25/46  soft-tissue]
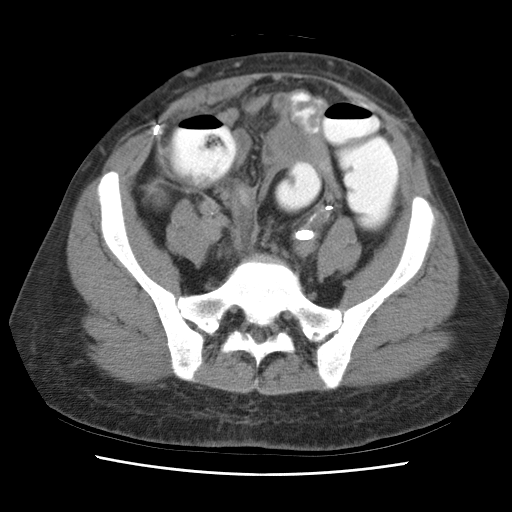
[im 28/46  soft-tissue]
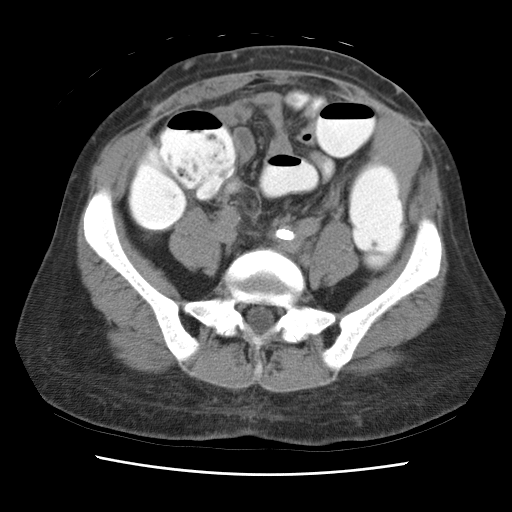
[im 32/46  soft-tissue]
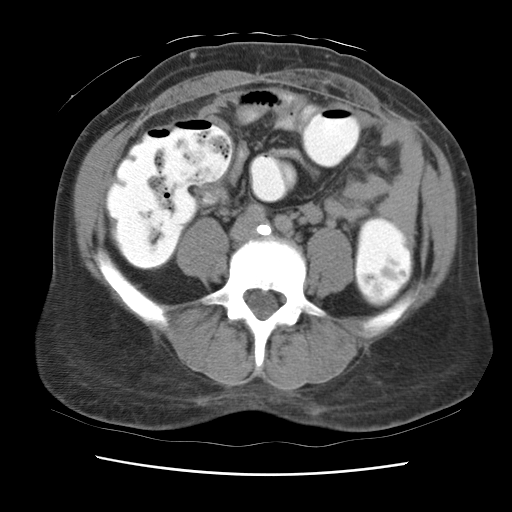
[im 32/46  bone]
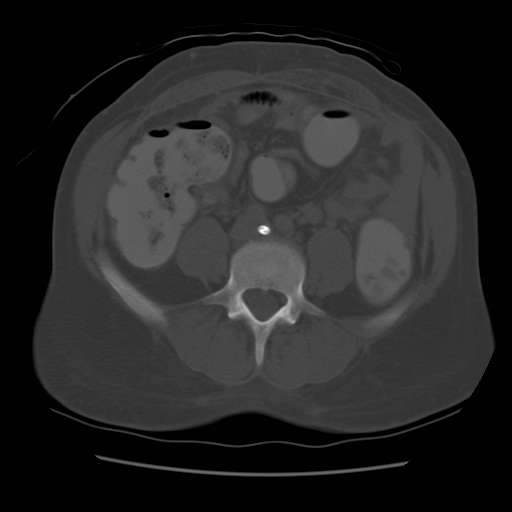
[im 35/46  soft-tissue]
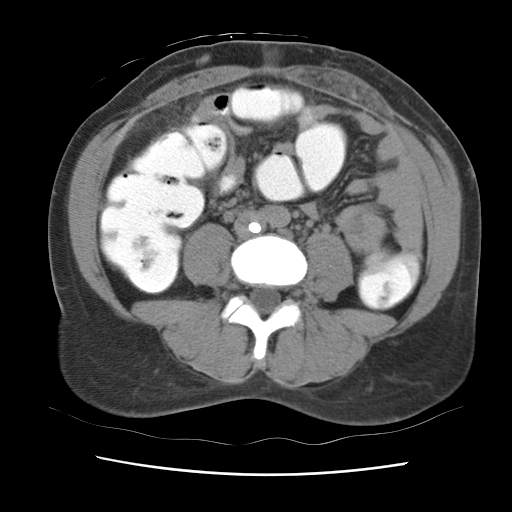
[im 40/46  soft-tissue]
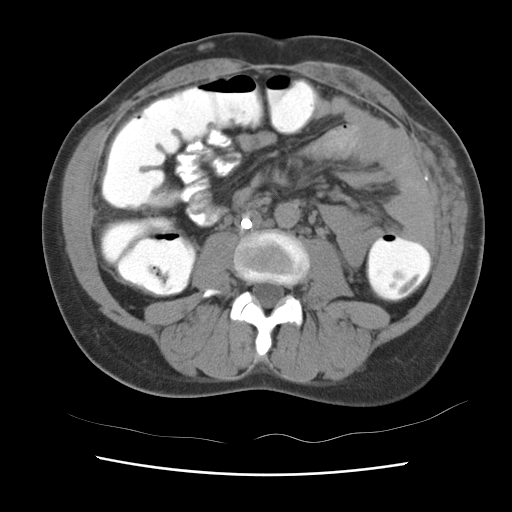
[im 40/46  lung]
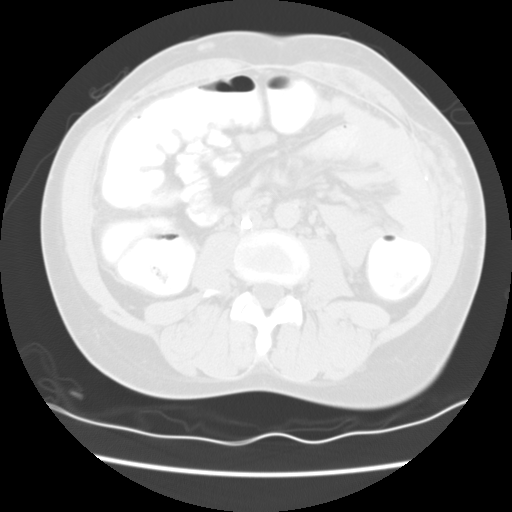
[im 41/46  lung]
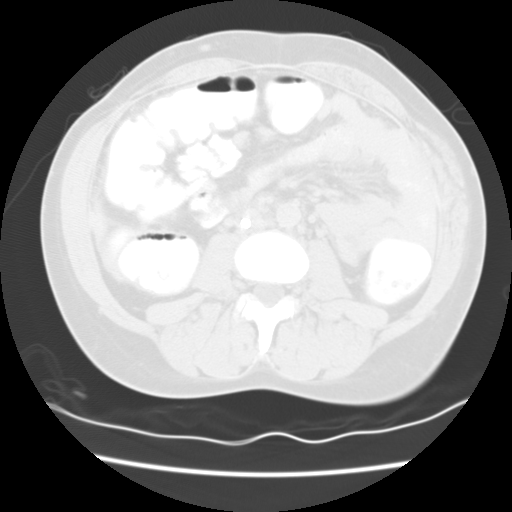
[im 43/46  soft-tissue]
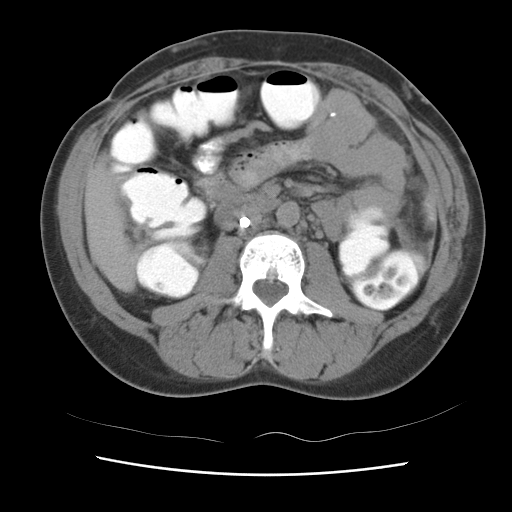
[im 43/46  lung]
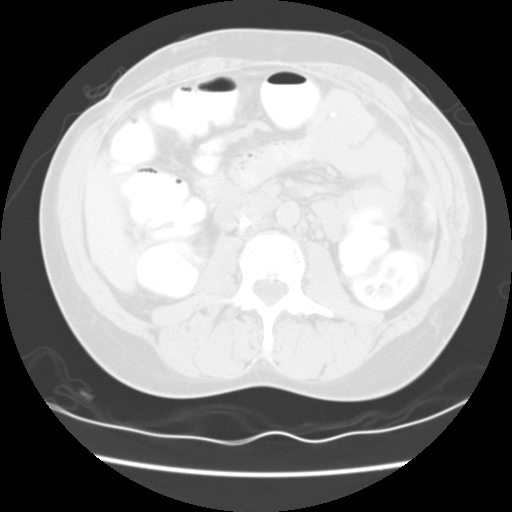
[im 44/46  lung]
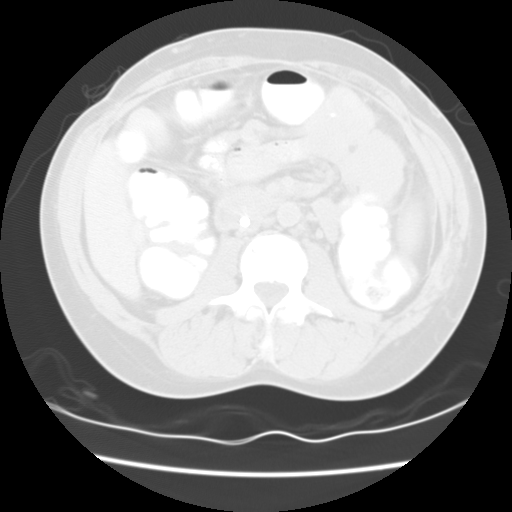

[14 of 32 positions shown; findings below may reference images not displayed]

THERE IS INHOMOGENEITY OF THE LIVER WITH WHAT APPEARS TO BE HYPERDENSITY OF THE LEFT LOBE, OR
HYPODENSITY OF THE RIGHT LOBE.  THIS IS A REGIONAL PHENOMENON AND IS THEREFORE LIKELY EITHER DUE TO
FATTY INFILTRATION, OR TO AN ABNORMALITY OF PERFUSION.  THERE ARE NUMEROUS COLLATERAL VESSELS ALONG
THE RIGHT ABDOMINAL WALL RAISING THE QUESTION OF SOME HEPATOSYSTEMIC SHUNTING WHICH MAY ACCOUNT FOR
THIS ABNORMALITY.  ANOTHER FINDING IS THAT THE PATIENT HAS A DIALYSIS CATHETER IN THE IVC.  THE
TIPS ARE IN THE UPPER IVC AREA NEAR THE HEPATIC VEINS, WHICH DO NOT VISUALIZE.  THERE MAY BE
THROMBUS IN THE HEPATIC VEINS AND/OR DISTAL IVC.  THERE IS FLOW IN THE PORTAL VEIN.
THE PATIENT HAS UNDERGONE BILATERAL NEPHRECTOMIES.  NO GALLSTONES OR BILIARY DILATATION.  THE
SPLEEN AND PANCREAS ARE NORMAL.
IMPRESSION
1.  UNREMARKABLE EXCEPT FOR FATTY INFILTRATION AND/OR PERFUSION ABNORMALITY TO THE LIVER, CAUSING A
GEOGRAPHIC PATTERN OF DIFFERENTIAL IN DENSITY.  THIS IS UNLIKELY TO BE CLINICALLY SIGNIFICANT.  SEE
ABOVE DISCUSSION.
2.  THERE IS A FEMORAL DIALYSIS CATHETER.  THE TIPS OF THE LUMEN ARE IN THE VICINITY OF THE HEPATIC
VEINS WHICH MAY BE OCCLUDED.  THERE ALSO MAY BE SOME THROMBUS IN THE IVC AROUND THE CATHETER TIPS.
3.  NO MASSES, ADENOPATHY, OR DEFINITELY ACUTE FINDING.
CT SCAN OF THE PELVIS
ON THE INITIAL SCANS, THERE IS NO CONTRAST IN THE DISTAL SMALL BOWEL OR COLON.  THIS MAKES
EVALUATION FOR APPENDICITIS DIFFICULT.  DELAYED STUDIES WILL BE DONE FOR FURTHER ASSESSMENT.  NO
OTHER FINDINGS OF SIGNIFICANCE EXCEPT THAT THE PATIENT HAS HAD PRIOR SURGERY IN THE PELVIS WITH
MULTIPLE SURGICAL CLIPS ON THE RIGHT, POSSIBLY RELATED TO PRIOR RENAL TRANSPLANT.
DELAYED IMAGES WERE CARRIED OUT BUT STILL THERE WAS NO FILLING OF THE SMALL BOWEL.  WE THEREFORE
REPEATED THE STUDY AFTER A GASTROGRAFIN ENEMA.
THERE IS REFLUX INTO THE SMALL BOWEL.  THERE IS A TUBULAR STRUCTURE EMANATING OFF OF THE DISTAL
CECUM EXTENDING DIRECTLY POSTERIORLY.  THIS IS BEST SEEN ON IMAGES 50 AND 51 OF THE INITIAL STUDY,
18, 19, AND 20 OF THE DELAYED STUDY, AND 17 THROUGH 20 OF THE GASTROGRAFIN ENEMA STUDY.  ON ALL OF
THESE IMAGES, THIS STRUCTURE DOES NOT FILL WITH CONTRAST.  PROXIMALLY, NEAR THE CECUM, IT HAS SOME
HIGH DENSITY MATERIAL WITHIN IT SUGGESTING THAT IT IS LIKELY APPENDICITIS WITH AN APPENDICOLITH.
THIS STRUCTURE MEASURES ABOUT 10 TO 12 MM IN THICKNESS.
IMPRESSION
FINDINGS CONSISTENT WITH ACUTE APPENDICITIS .

## 2004-11-18 ENCOUNTER — Encounter (HOSPITAL_COMMUNITY): Admission: RE | Admit: 2004-11-18 | Discharge: 2005-02-16 | Payer: Self-pay | Admitting: Nephrology

## 2004-12-30 ENCOUNTER — Emergency Department (HOSPITAL_COMMUNITY): Admission: EM | Admit: 2004-12-30 | Discharge: 2004-12-30 | Payer: Self-pay | Admitting: *Deleted

## 2005-03-15 IMAGING — CT CT HEAD WO/W CM
1 of 2 series · 13 of 30 positions shown, 17 images · IV contrast (100 ML OMNI 300)
Comparison: none

CLINICAL DATA: Prior history of stroke with end stage renal disease related to glomerulonephritis.  The patient has persistent headaches. 
TECHNIQUE
Prior comparison is made with CT from 05/13/99.  
[DATE]mm axial images were obtained through the brain without contrast followed by administration of 677cc of Omnipaque contrast injected at .3cc per second.  Following contrast injection, repeat axial imaging was performed through the brain. 
CT HEAD W/O AND W/CONTRAST
There is an area of brain atrophy involving the cortex with encephalomalacia in the high right parietal region along the vertex extending posteriorly.  This is consistent with a prior area of remote infarction.  In the left putamen, there is a non enhancing punctate focus of low density consistent with a prior area of lacunar infarction.  Brain volume is otherwise symmetric.  No acute focal effacement, focal brain edema, hemorrhage, herniation, midline shift, or extra-axial fluid collection.  Ventricles are symmetric.  Cisterns are patent.  Right ocular prosthesis is noted.  Visualized sinuses and mastoids are clear.  Post contrast, there is normal vascular enhancement.  No definite abnormal brain parenchyma enhancement. 
IMPRESSION
Areas of remote infarction in the left basil ganglia and high right posterior parietal region. 
No evidence of an acute intracranial abnormality or abnormal enhancement. 
Right eye prosthesis.

[Series 2: brain · axial · 0.49mm/px · z∈[+7,+129]mm · 13 of 28 slices shown, 17 images]
[im 2/28  brain]
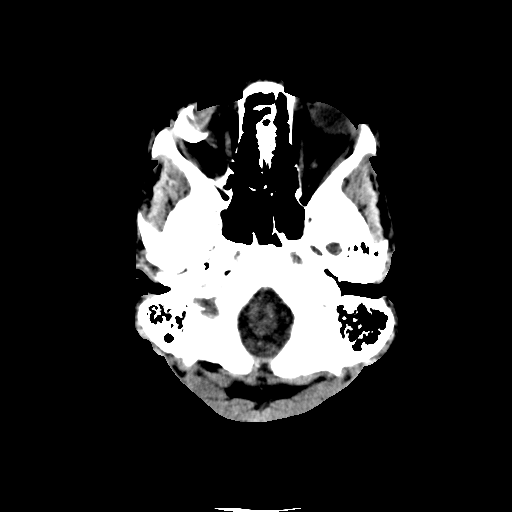
[im 2/28  bone]
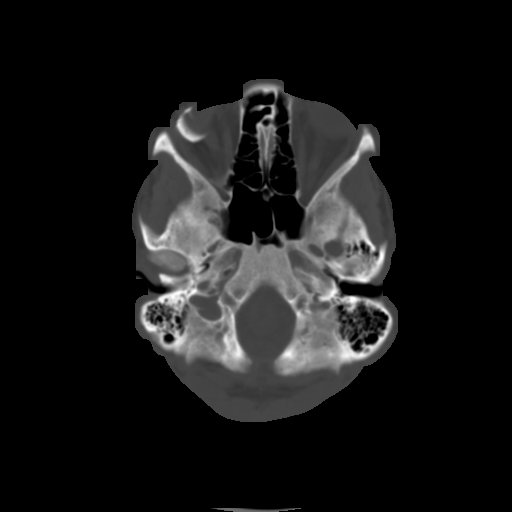
[im 4/28  brain]
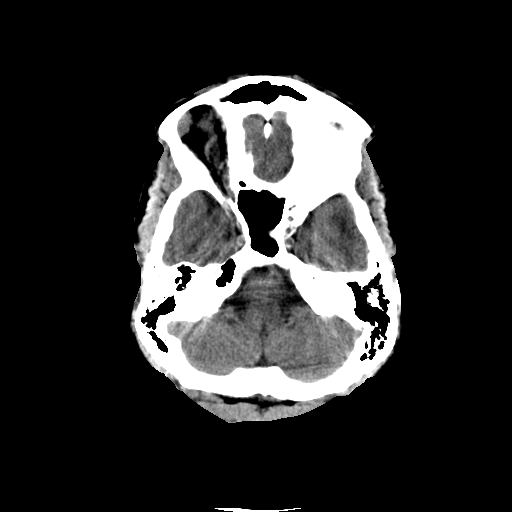
[im 6/28  brain]
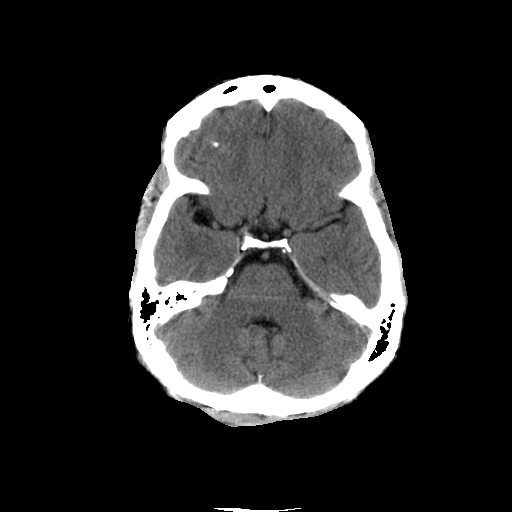
[im 8/28  brain]
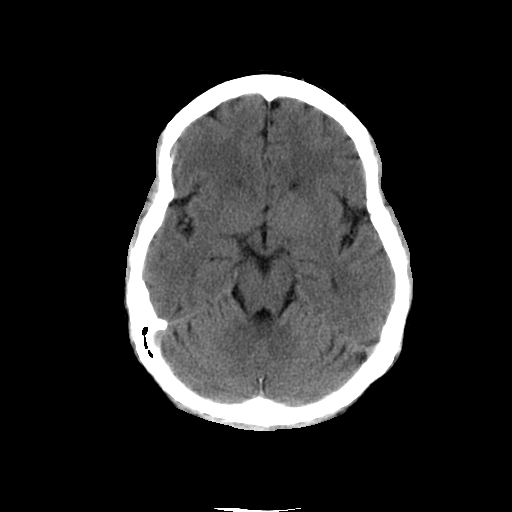
[im 10/28  brain]
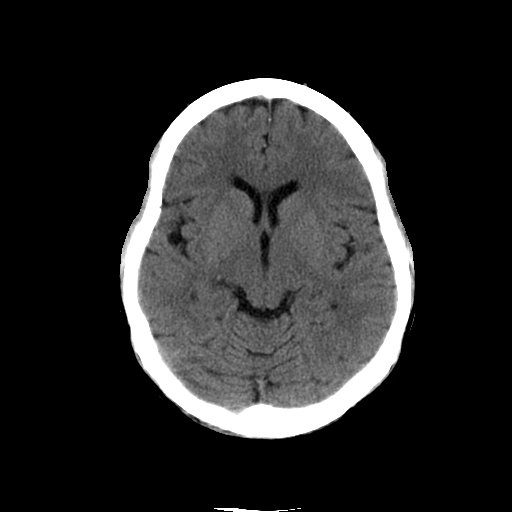
[im 10/28  bone]
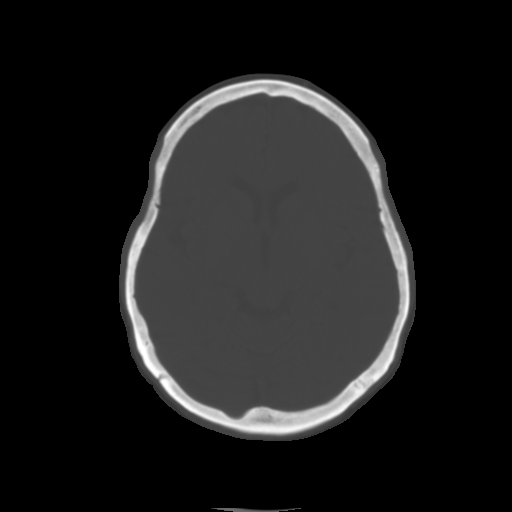
[im 12/28  brain]
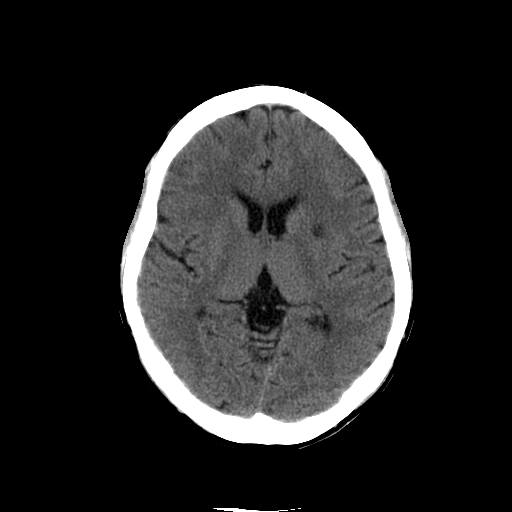
[im 14/28  brain]
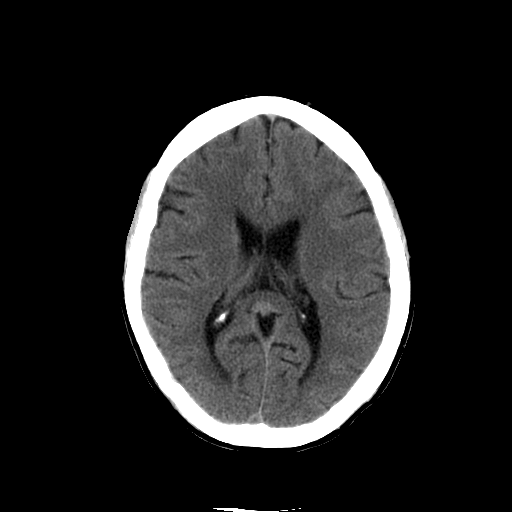
[im 16/28  brain]
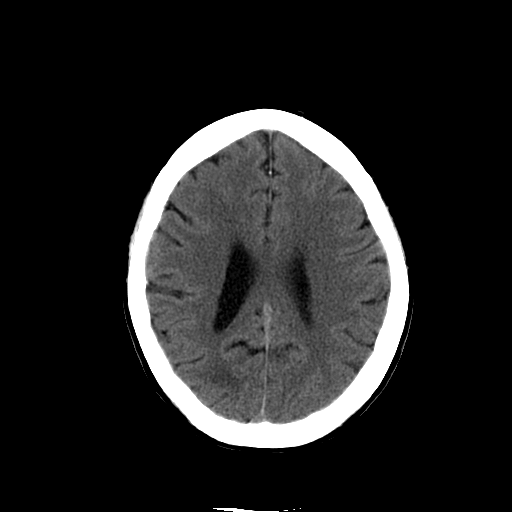
[im 18/28  brain]
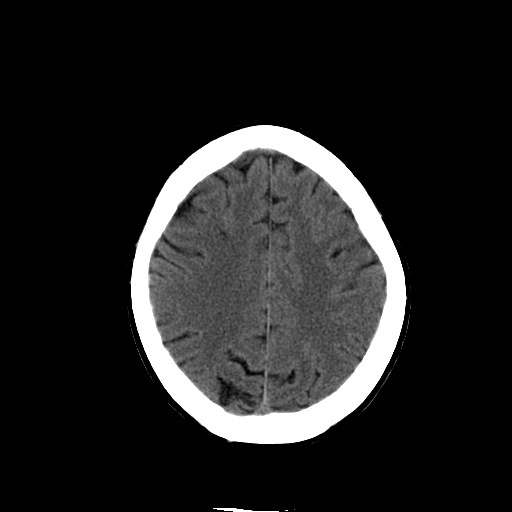
[im 18/28  bone]
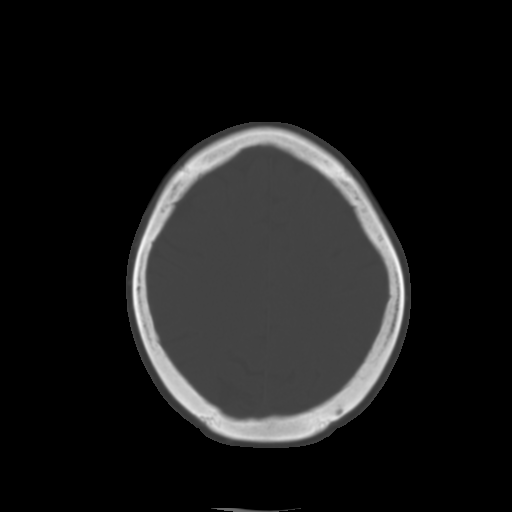
[im 20/28  brain]
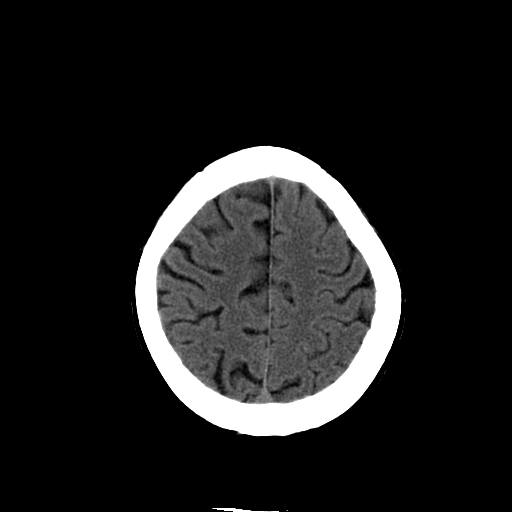
[im 22/28  brain]
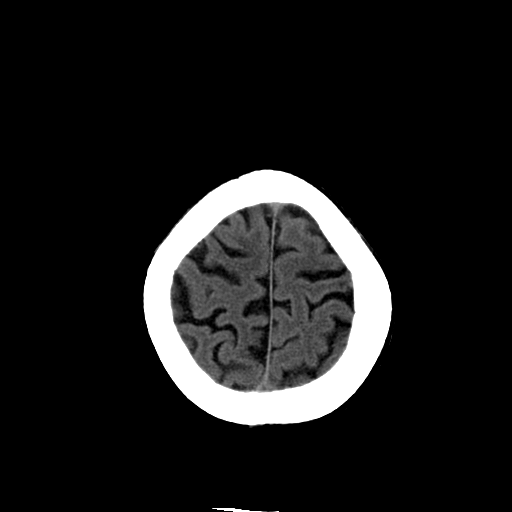
[im 24/28  brain]
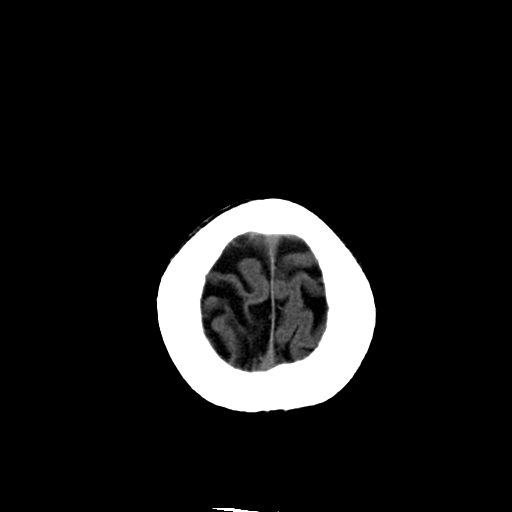
[im 26/28  brain]
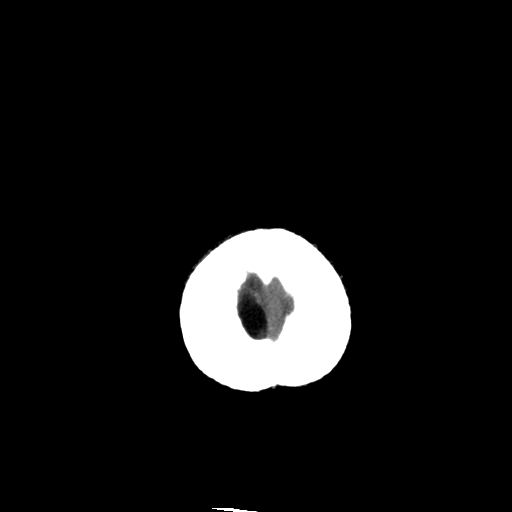
[im 26/28  bone]
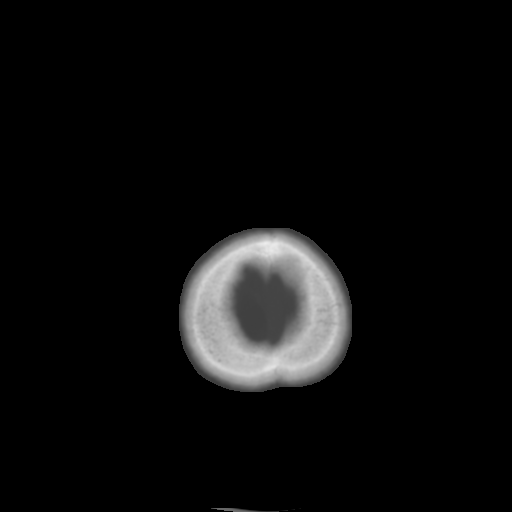

[13 of 30 positions shown; findings below may reference images not displayed]

## 2005-03-20 ENCOUNTER — Encounter (HOSPITAL_COMMUNITY): Admission: RE | Admit: 2005-03-20 | Discharge: 2005-06-18 | Payer: Self-pay | Admitting: Nephrology

## 2005-03-21 ENCOUNTER — Emergency Department (HOSPITAL_COMMUNITY): Admission: EM | Admit: 2005-03-21 | Discharge: 2005-03-21 | Payer: Self-pay | Admitting: Emergency Medicine

## 2005-04-06 IMAGING — MR MR HEAD WO/W CM
6 of 8 series · 24 of 48 positions shown · IV contrast (omniscan)
Comparison: MRI 05/17/99.

CLINICAL DATA: Headaches.  ESRD.  

 MRI BRAIN WITHOUT AND WITH CONTRAST
 15 cc IV Omniscan.

[Series 2: FLAIR · axial · 5.0mm · 0.45mm/px · z∈[-110,+23]mm · 2 of 24 slices shown]
[im 1/24]
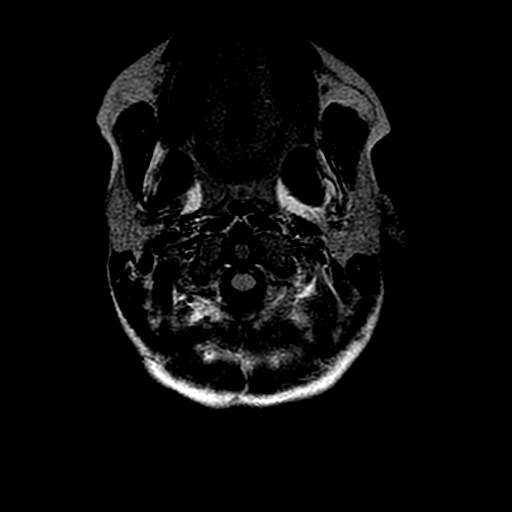
[im 24/24]
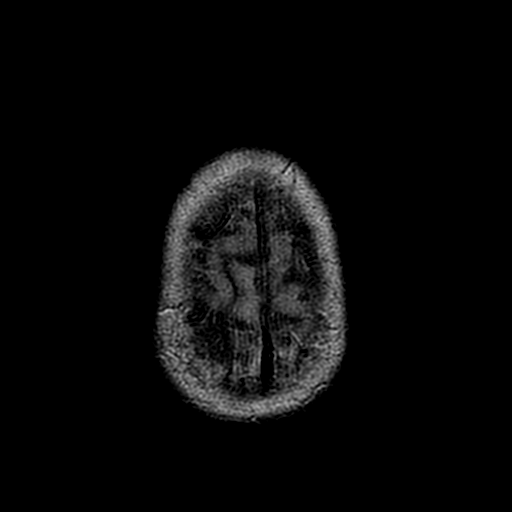

[Series 3: T2 · axial · 5.0mm · 0.47mm/px · z∈[-112,+19]mm · 3 of 24 slices shown]
[im 1/24]
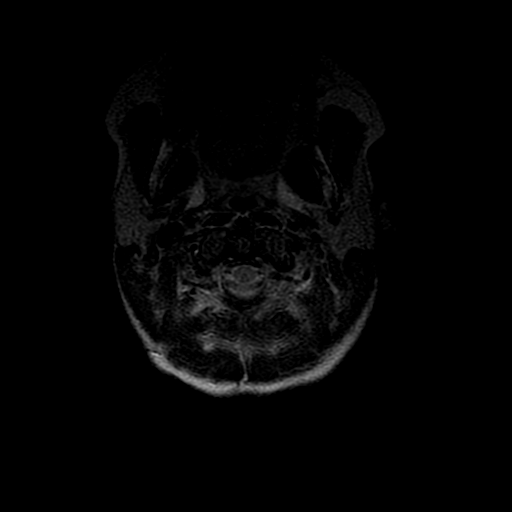
[im 12/24]
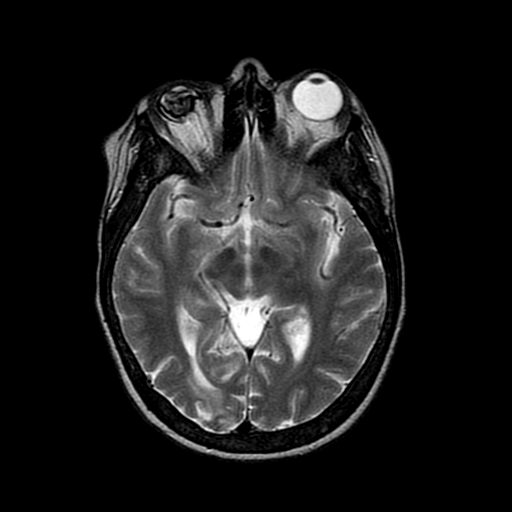
[im 24/24]
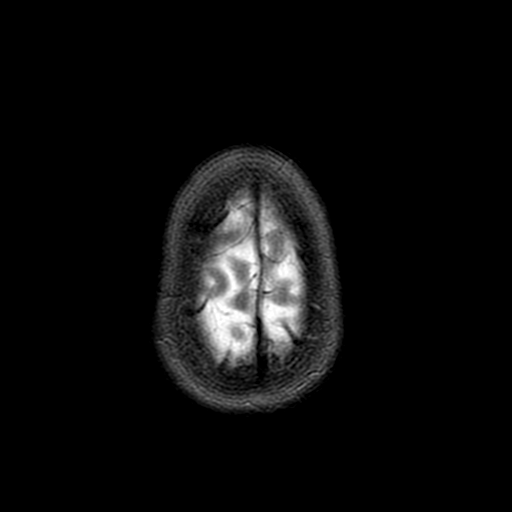

[Series 4: intracranial · axial · 1.4mm · 0.43mm/px · z∈[-98,-38]mm · 7 of 106 slices shown]
[im 1/106]
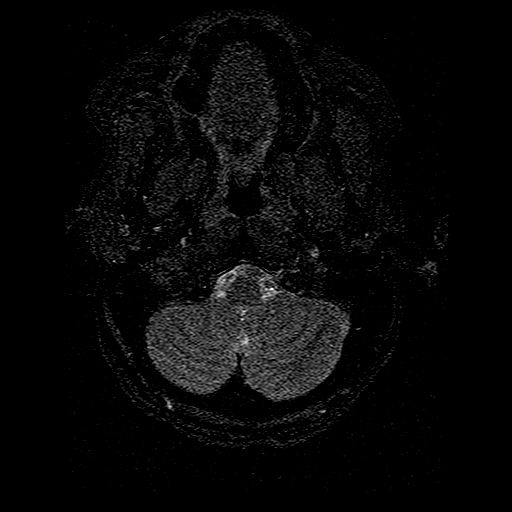
[im 16/106]
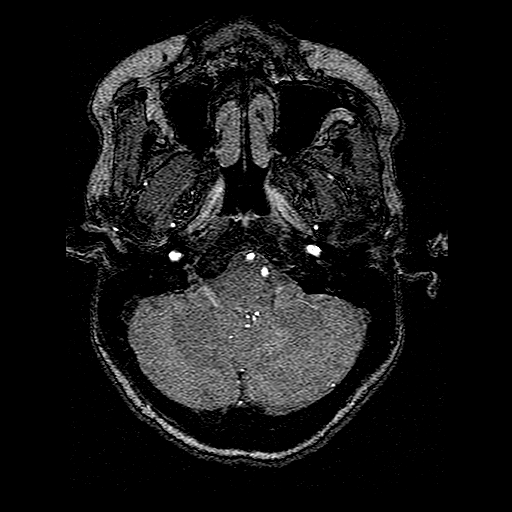
[im 31/106]
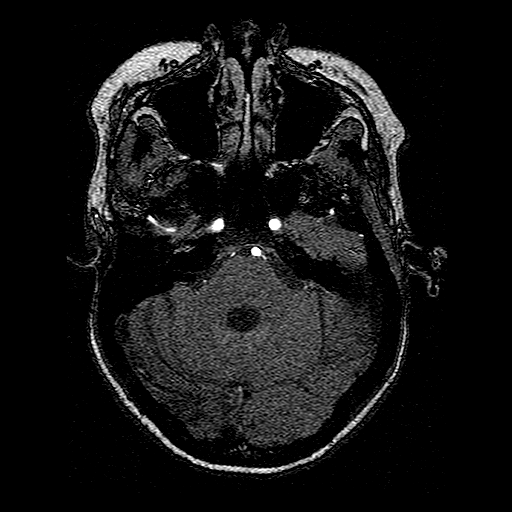
[im 46/106]
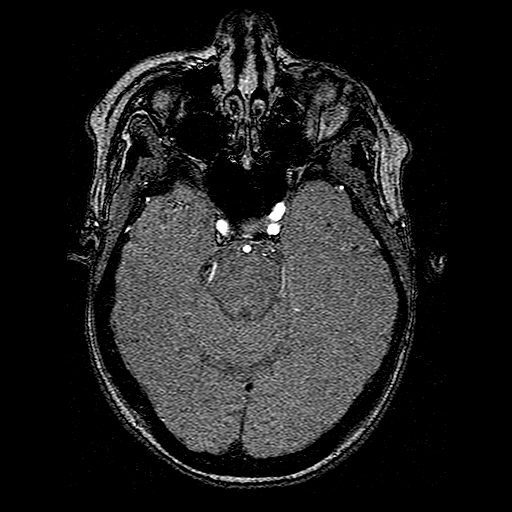
[im 61/106]
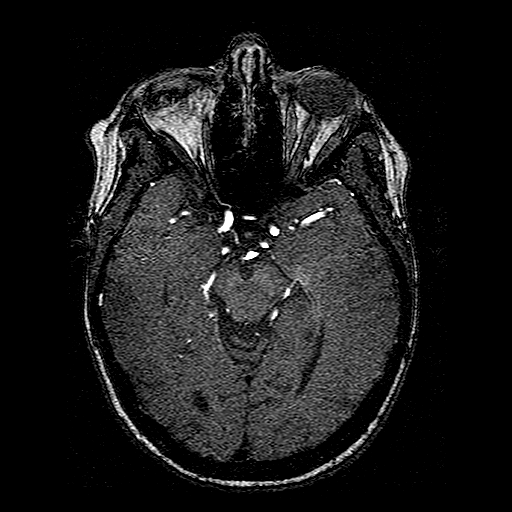
[im 76/106]
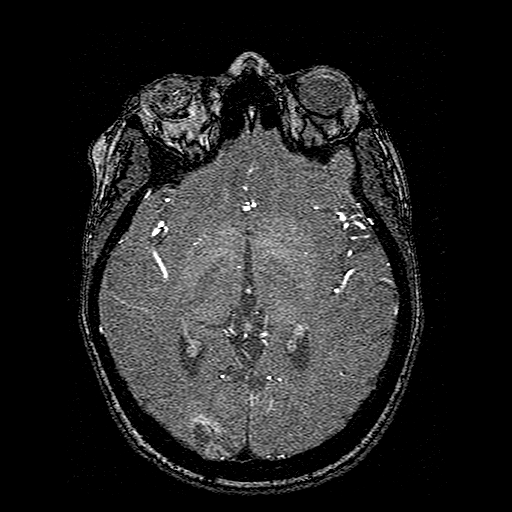
[im 91/106]
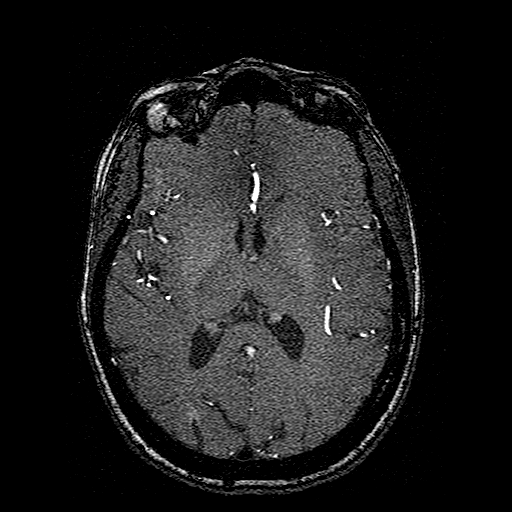

[Series 5: DWI · axial · 5.0mm · 0.94mm/px · z∈[-108,+12]mm · 7 of 48 slices shown]
[im 1/48]
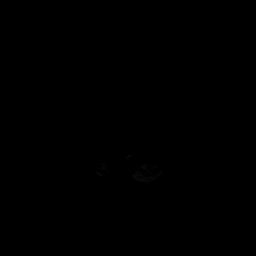
[im 8/48]
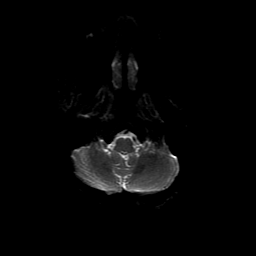
[im 16/48]
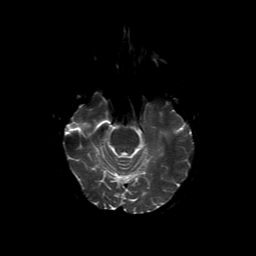
[im 24/48]
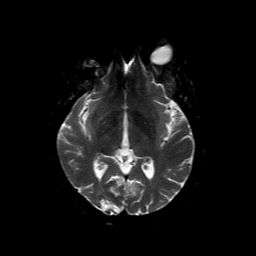
[im 32/48]
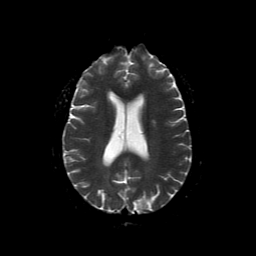
[im 40/48]
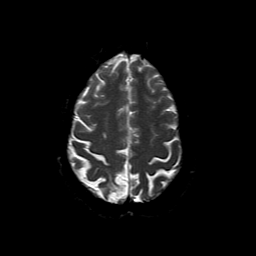
[im 48/48]
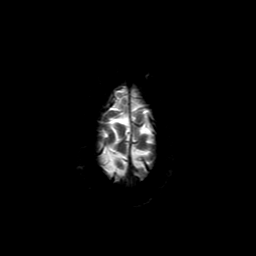

[Series 7: T1 · sagittal · 5.0mm · 0.47mm/px · 2 of 12 slices shown]
[im 1/12]
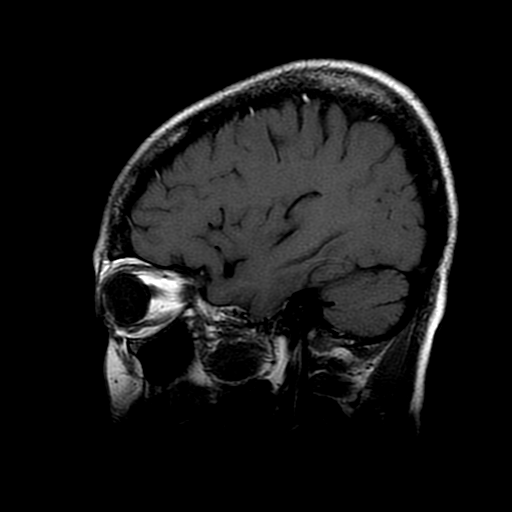
[im 12/12]
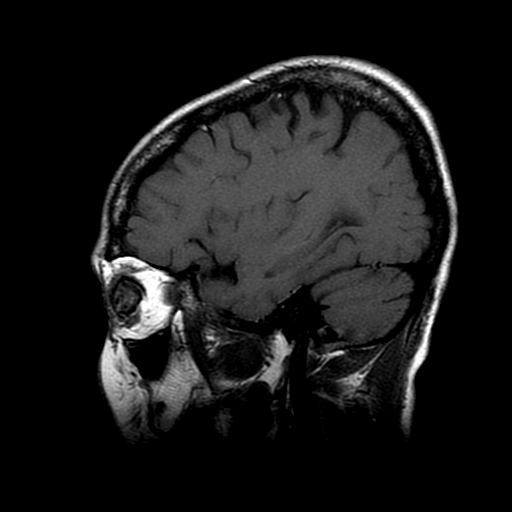

[Series 9: T1 post-contrast · coronal · 5.0mm · 0.51mm/px · 3 of 24 slices shown]
[im 1/24]
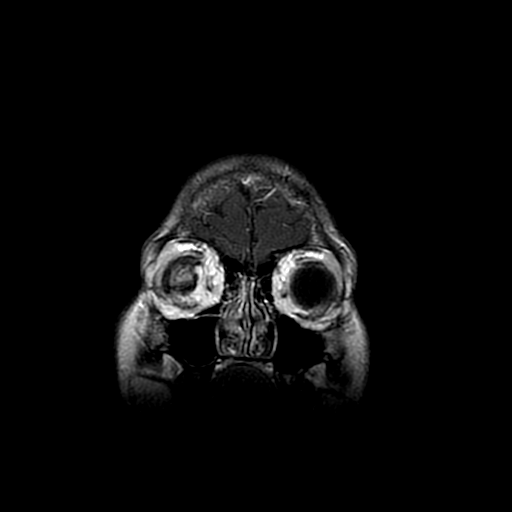
[im 12/24]
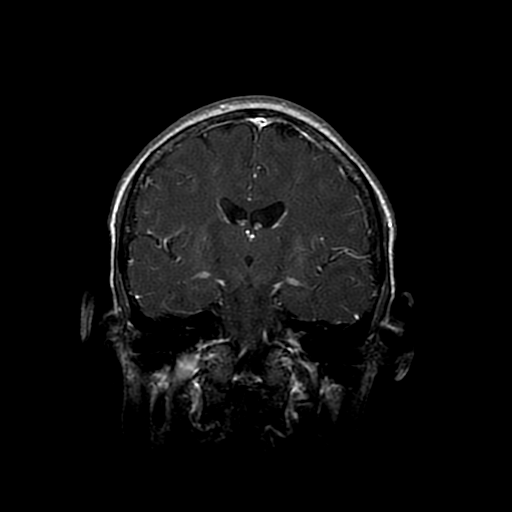
[im 24/24]
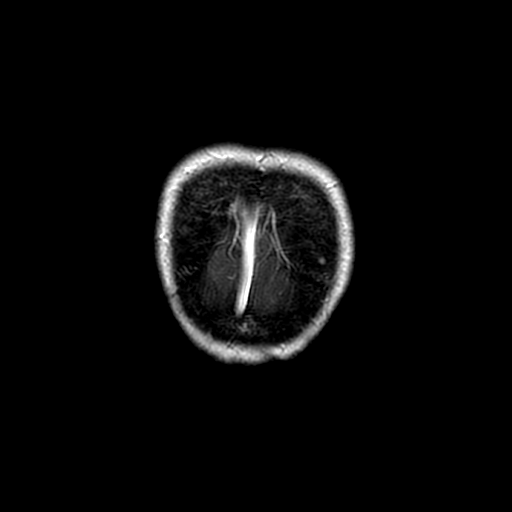

[24 of 48 positions shown; findings below may reference images not displayed]

Chronic infarct is noted in the right occipital lobe.  Also noted are small chronic white matter infarcts bilaterally.  Negative for acute infarct.  No mass lesion is identified.  The enhancement pattern is normal.

 IMPRESSION
 Chronic ischemic changes.  No acute abnormality.

 Note is made of a right eye prosthesis.

 MRA INTRACRANIAL
 No significant stenosis is identified.  Negative for aneurysm. 

 IMPRESSION
 Negative.

## 2005-04-11 ENCOUNTER — Emergency Department (HOSPITAL_COMMUNITY): Admission: EM | Admit: 2005-04-11 | Discharge: 2005-04-11 | Payer: Self-pay | Admitting: Emergency Medicine

## 2005-05-19 IMAGING — XA IR [PERSON_NAME]/[PERSON_NAME]
1 series · 12 of 16 positions shown · IV contrast (omnipaque)
Comparison: none

CLINICAL DATA: The patient has a history of end-stage renal disease and is currently dialyzed through a left femoral tunneled catheter.  She has recently developed worsening significant bilateral lower extremity edema and pain, left greater than right.  
PROCEDURES ? 03/02/03
1.  ULTRASOUND GUIDANCE FOR RIGHT COMMON FEMORAL VENOUS ACCESS
2.  BILATERAL LOWER EXTREMITY VENOGRAPHY
3.  IVC VENOGRAM
CONTRAST:  50 cc Omnipaque 300.
FLUORO TIME:  3.7 minutes.
The initial examination was performed of the lower extremities bilaterally.  Ultrasound was also performed in assessing femoral venous patency.  The right groin was then sterilely prepped and draped.  Local anesthesia was provided with 1% lidocaine.  The common femoral vein was then accessed under direct ultrasound guidance with a 21 gauge needle.  After placement of a 4 French micropuncture dilator over a guidewire initial right-sided venogram was performed with visualization from the level of the distal external iliac vein to the IVC.  The access was then injected in performing a complete IVC venogram through to the level of the right atrium.  
A 4 French catheter was then placed over a guidewire.  This was used to selectively catheterize the contralateral left common iliac vein.  The catheter was advanced over a guidewire to the level of the left external iliac vein and additional contrast sonography performed of left-sided veins.  After the procedure the catheter was removed and hemostasis was obtained with manual compression.
COMPLICATIONS:  None.

[Series 1000: run · 0.16mm/px · 12 of 16 slices shown]
[im 1/16]
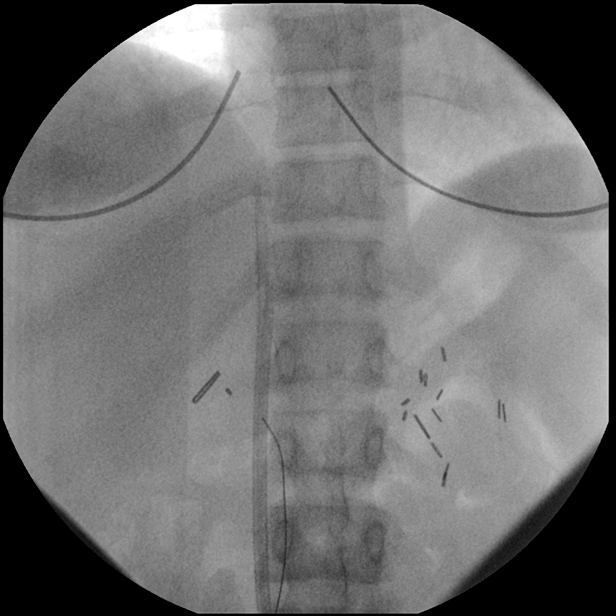
[im 3/16]
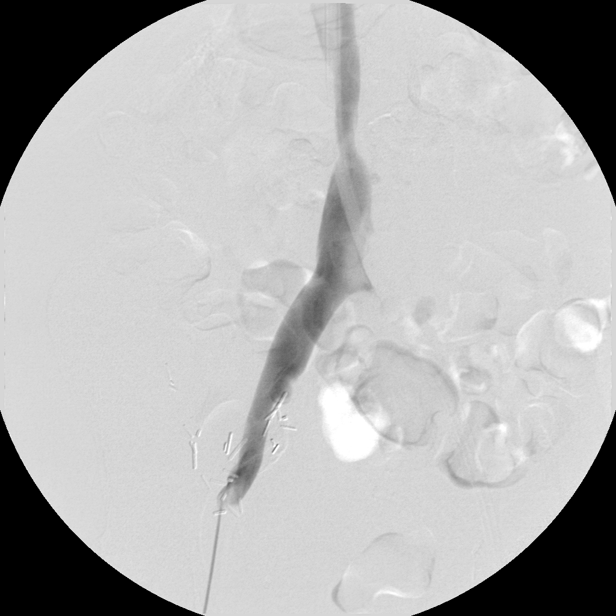
[im 4/16]
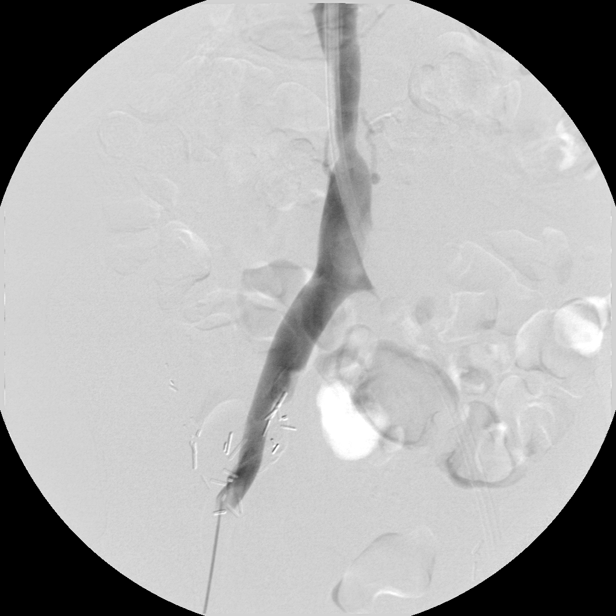
[im 5/16]
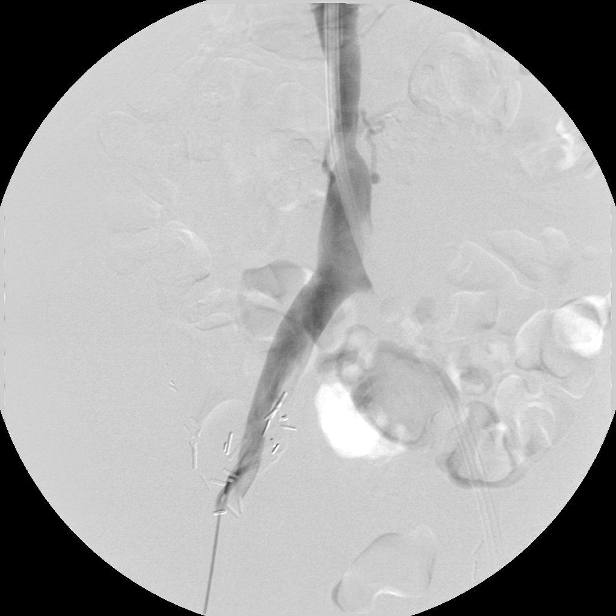
[im 7/16]
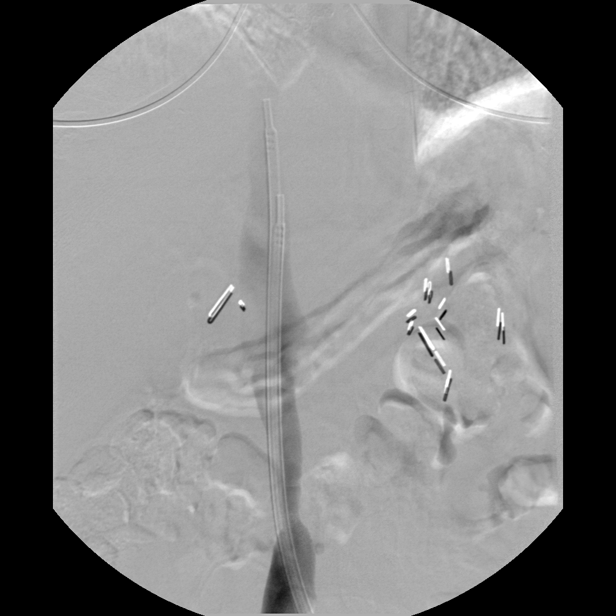
[im 8/16]
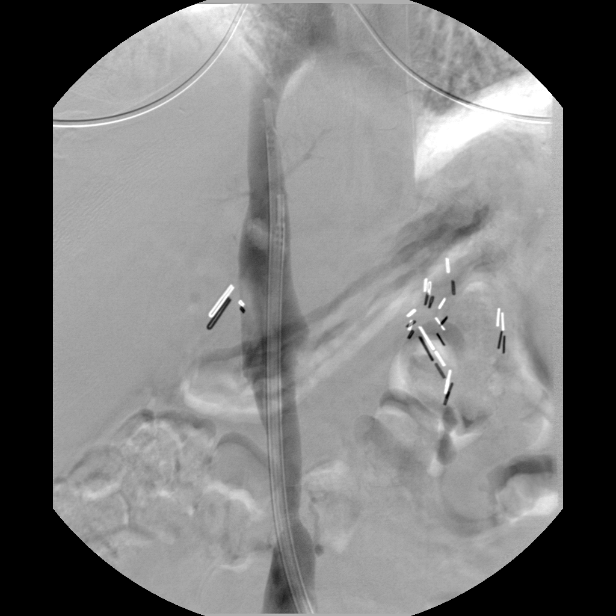
[im 9/16]
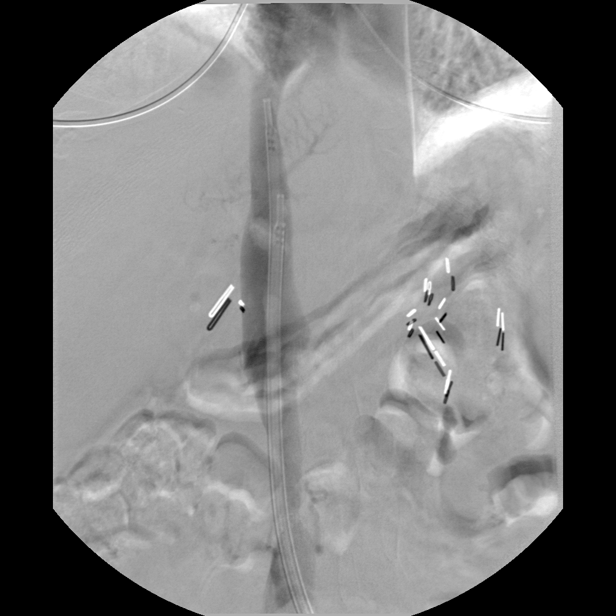
[im 11/16]
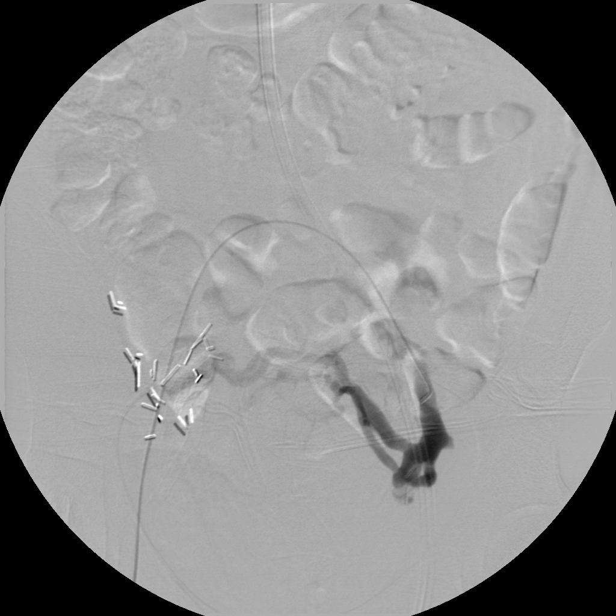
[im 12/16]
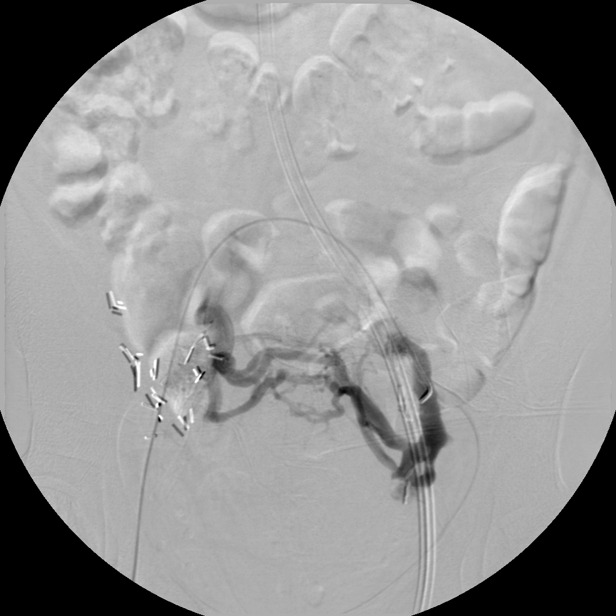
[im 13/16]
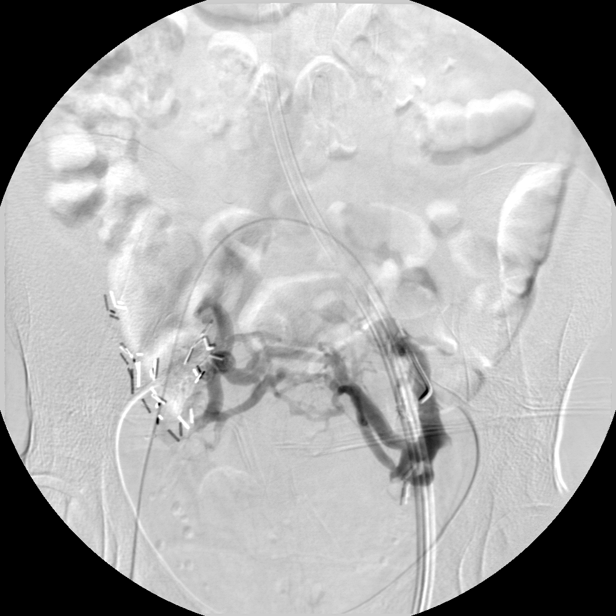
[im 15/16]
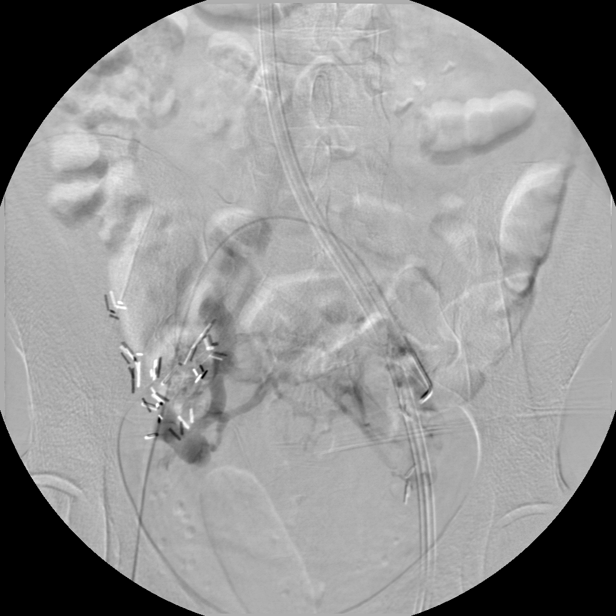
[im 16/16]
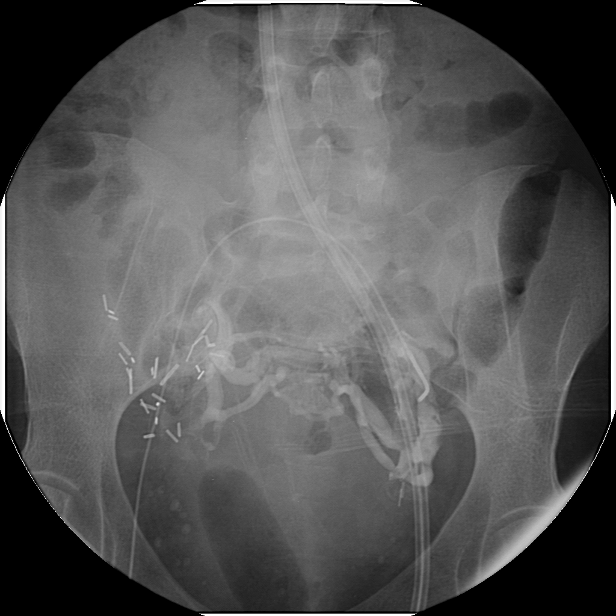

[12 of 16 positions shown; findings below may reference images not displayed]

FINDINGS: Initial ultrasound shows widely patent right common femoral vein.  At the level of the pre-existing left femoral tunneled Ash catheter a discernible patent left femoral vein is not seen in the region of the groin.  The left groin was therefore not able to be accessed for the venogram procedure.
Right-sided venography demonstrates widely patent external iliac and common iliac venous segments.  The IVC venogram shows a significant focal stenosis of the lower IVC at the level of the upper L4 vertebral body.  This area of narrowing approaches 80 percent in estimated caliber and is associated with filling of some small parallel collaterals.  This stenosis presumably is due to the long-standing indwelling tunneled catheter.  The distal catheter tip is at the level of the juncture between the IVC and the right atrium.  The rest of the IVC is widely patent.  A small amount of pericatheter thrombus is seen adjacent to the shorter arterial lumen tip of the tunnel catheter.
Due to the fact that the left femoral vein could not be accessed as it is stenosed around the pre-existing catheter, left-sided iliac veins were accessed via a diagnostic catheter from a right-side approach.  Contrast injection at the level of the left external iliac vein shows outflow to be completely via retrograde flow into internal iliac collaterals across the pelvis and empty into the right internal iliac supply.  The left external iliac and common iliac veins are diffusely stenosed around the indwelling catheter.  There is no discernible flow around the catheter and this presumably extends all the way through the femoral segment given the ultrasound appearance although the catheter would not advance to the level of the femoral veins via the right-sided approach.
After discussion with the patient she will be brought back on [REDACTED] 03/07/03 for further intervention which is scheduled at [HOSPITAL] at 2 p.m.  This will include removal of the pre-existing left tunneled catheter to allow recanalization of the left iliac system, IVC angioplasty to treat IVC stenosis, and placement of a new tunneled dialysis catheter from the right femoral approach.
IMPRESSION
1.  Diffusely stenotic left-sided iliac veins conforming to the indwelling dialysis catheter.  Venous outflow is via internal iliac venous collaterals that extend across the pelvis.  This would account for the more severe left lower extremity edema compared to the right.  This can only be resolved with removal of the indwelling catheter.
2.  80 percent stenosis of the IVC with associated collateral vein filling.  This is presumably secondary to the indwelling tunneled catheter.  This needs to be treated with angioplasty but this will be deferred until the follow-up-procedure as the left-sided catheter does need to be removed in order to allow optimal balloon expansion at the level of stenosis.  This finding accounts for the right lower extremity edema.  Right-sided femoral and iliac veins are widely patent and should allow placement of a new tunneled dialysis catheter at the time of the next procedure.  
These findings were communicated with the charge nurse at the dialysis center.  The patient was given an appointment for follow-up procedure on 03/07/03 at [HOSPITAL].  She will restart her therapeutic Coumadin today and stop Coumadin on [REDACTED] for the next procedure.
Copy of report:  Nando [REDACTED].

## 2005-05-19 IMAGING — US IR US GUIDE VASC ACCESS RIGHT
1 series · 2 of 2 positions shown · IV contrast (omnipaque)
Comparison: none

CLINICAL DATA: The patient has a history of end-stage renal disease and is currently dialyzed through a left femoral tunneled catheter.  She has recently developed worsening significant bilateral lower extremity edema and pain, left greater than right.  
PROCEDURES ? 03/02/03
1.  ULTRASOUND GUIDANCE FOR RIGHT COMMON FEMORAL VENOUS ACCESS
2.  BILATERAL LOWER EXTREMITY VENOGRAPHY
3.  IVC VENOGRAM
CONTRAST:  50 cc Omnipaque 300.
FLUORO TIME:  3.7 minutes.
The initial examination was performed of the lower extremities bilaterally.  Ultrasound was also performed in assessing femoral venous patency.  The right groin was then sterilely prepped and draped.  Local anesthesia was provided with 1% lidocaine.  The common femoral vein was then accessed under direct ultrasound guidance with a 21 gauge needle.  After placement of a 4 French micropuncture dilator over a guidewire initial right-sided venogram was performed with visualization from the level of the distal external iliac vein to the IVC.  The access was then injected in performing a complete IVC venogram through to the level of the right atrium.  
A 4 French catheter was then placed over a guidewire.  This was used to selectively catheterize the contralateral left common iliac vein.  The catheter was advanced over a guidewire to the level of the left external iliac vein and additional contrast sonography performed of left-sided veins.  After the procedure the catheter was removed and hemostasis was obtained with manual compression.
COMPLICATIONS:  None.

[Series 1: sp us guide vasc access*right* · 2 of 2 slices shown]
[im 1/2]
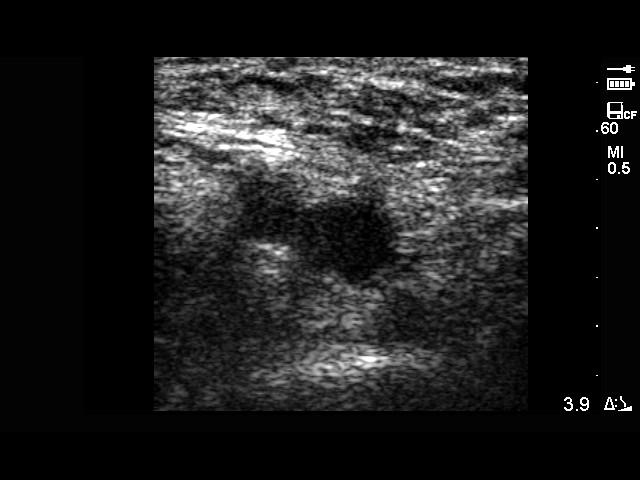
[im 2/2]
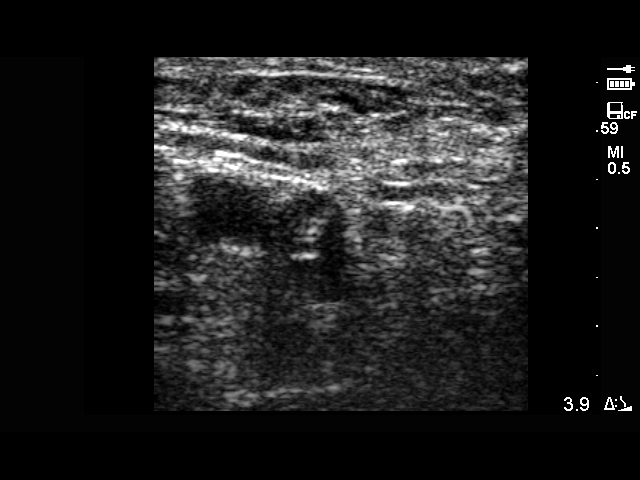

[2 of 2 positions shown; findings below may reference images not displayed]

FINDINGS: Initial ultrasound shows widely patent right common femoral vein.  At the level of the pre-existing left femoral tunneled Ash catheter a discernible patent left femoral vein is not seen in the region of the groin.  The left groin was therefore not able to be accessed for the venogram procedure.
Right-sided venography demonstrates widely patent external iliac and common iliac venous segments.  The IVC venogram shows a significant focal stenosis of the lower IVC at the level of the upper L4 vertebral body.  This area of narrowing approaches 80 percent in estimated caliber and is associated with filling of some small parallel collaterals.  This stenosis presumably is due to the long-standing indwelling tunneled catheter.  The distal catheter tip is at the level of the juncture between the IVC and the right atrium.  The rest of the IVC is widely patent.  A small amount of pericatheter thrombus is seen adjacent to the shorter arterial lumen tip of the tunnel catheter.
Due to the fact that the left femoral vein could not be accessed as it is stenosed around the pre-existing catheter, left-sided iliac veins were accessed via a diagnostic catheter from a right-side approach.  Contrast injection at the level of the left external iliac vein shows outflow to be completely via retrograde flow into internal iliac collaterals across the pelvis and empty into the right internal iliac supply.  The left external iliac and common iliac veins are diffusely stenosed around the indwelling catheter.  There is no discernible flow around the catheter and this presumably extends all the way through the femoral segment given the ultrasound appearance although the catheter would not advance to the level of the femoral veins via the right-sided approach.
After discussion with the patient she will be brought back on [REDACTED] 03/07/03 for further intervention which is scheduled at [HOSPITAL] at 2 p.m.  This will include removal of the pre-existing left tunneled catheter to allow recanalization of the left iliac system, IVC angioplasty to treat IVC stenosis, and placement of a new tunneled dialysis catheter from the right femoral approach.
IMPRESSION
1.  Diffusely stenotic left-sided iliac veins conforming to the indwelling dialysis catheter.  Venous outflow is via internal iliac venous collaterals that extend across the pelvis.  This would account for the more severe left lower extremity edema compared to the right.  This can only be resolved with removal of the indwelling catheter.
2.  80 percent stenosis of the IVC with associated collateral vein filling.  This is presumably secondary to the indwelling tunneled catheter.  This needs to be treated with angioplasty but this will be deferred until the follow-up-procedure as the left-sided catheter does need to be removed in order to allow optimal balloon expansion at the level of stenosis.  This finding accounts for the right lower extremity edema.  Right-sided femoral and iliac veins are widely patent and should allow placement of a new tunneled dialysis catheter at the time of the next procedure.  
These findings were communicated with the charge nurse at the dialysis center.  The patient was given an appointment for follow-up procedure on 03/07/03 at [HOSPITAL].  She will restart her therapeutic Coumadin today and stop Coumadin on [REDACTED] for the next procedure.
Copy of report:  Nando [REDACTED].

## 2005-05-26 IMAGING — XA IR [PERSON_NAME]/[PERSON_NAME]
1 series · 11 of 24 positions shown · non-contrast
Comparison: Prior studies dated 03/02/03.

CLINICAL DATA: The patient has a history of end-stage renal disease and has an indwelling left femoral tunneled catheter.  As documented on prior venogram studies of 03/02/03, the left iliac veins have diffusely stenosed around the indwelling catheter resulting in significant symptomatic left lower extremity edema and pain.  There also is a component of right lower extremity edema secondary to an 80 percent lower IVC stenosis.  After discussion with Dr. Baldeon, it was decided to place a translumbar catheter for current dialysis needs, remove the left femoral catheter, and subsequently treat underlying venous stenosis with angioplasty.  A patent right femoral and iliac system will be saved for future graft site.
PROCEDURES ? 03/09/03 
1.  PLACEMENT OF TRANSLUMBAR TUNNELED DIALYSIS CATHETER INTO IVC WITH FLUOROSCOPIC GUIDANCE
2.  REMOVAL OF TUNNELED LEFT FEMORAL DIALYSIS CATHETER 
3.  VENOUS ANGIOPLASTY OF LEFT ILIAC VEINS AND IVC
4.  IVC VENOGRAM

[Series 1000: run · 0.10mm/px · 11 of 32 slices shown]
[im 2/32]
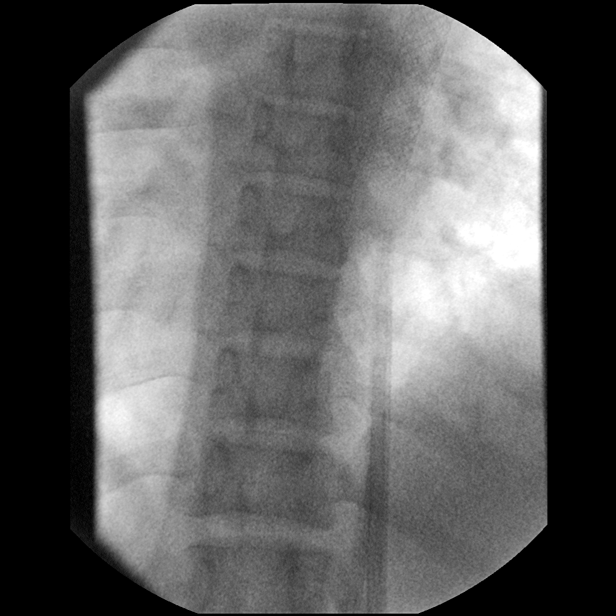
[im 5/32]
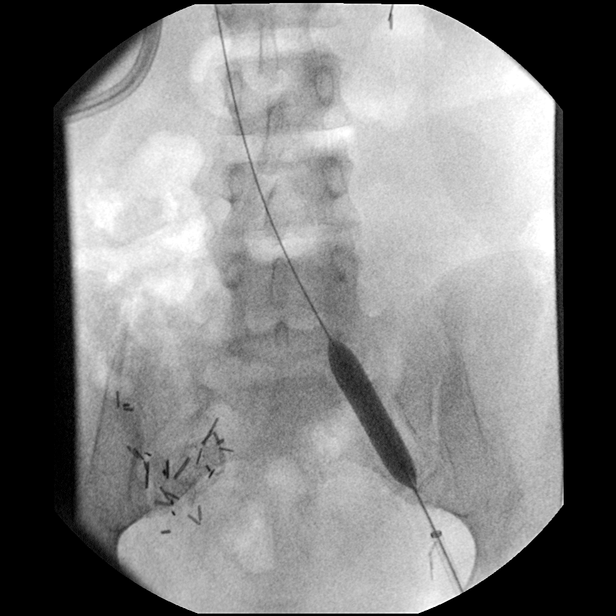
[im 7/32]
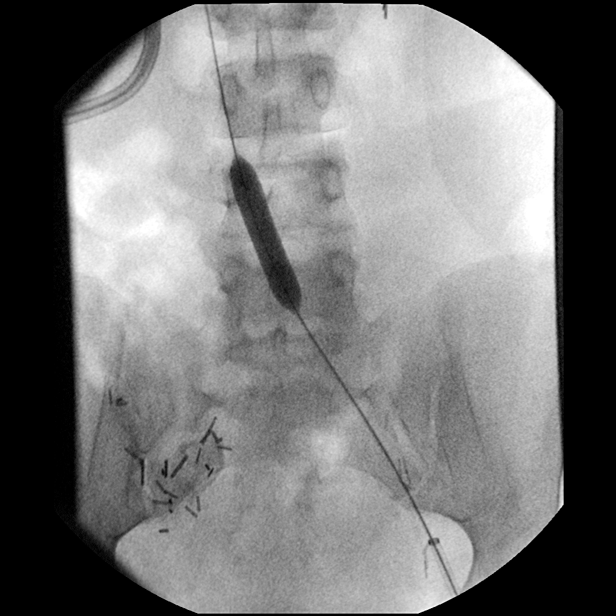
[im 10/32]
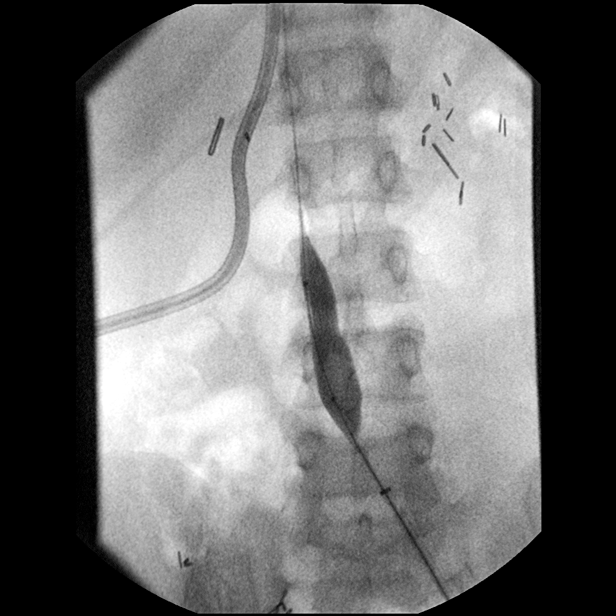
[im 13/32]
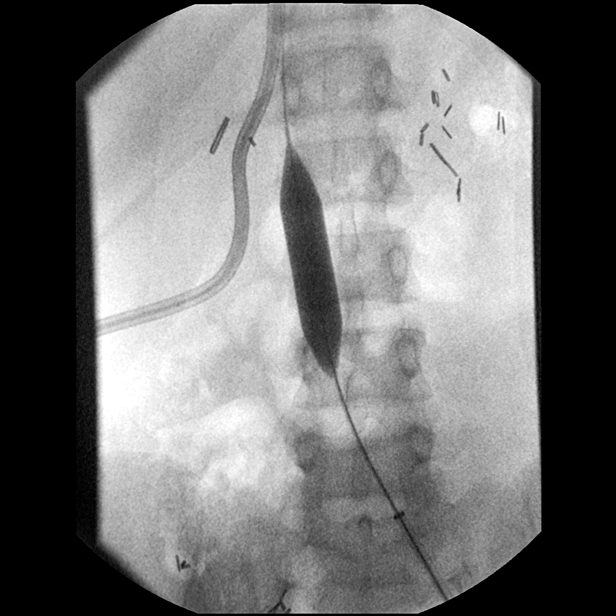
[im 17/32]
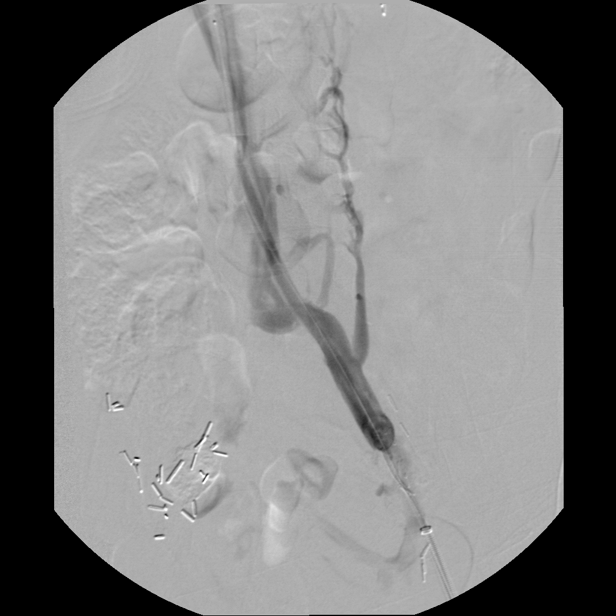
[im 19/32]
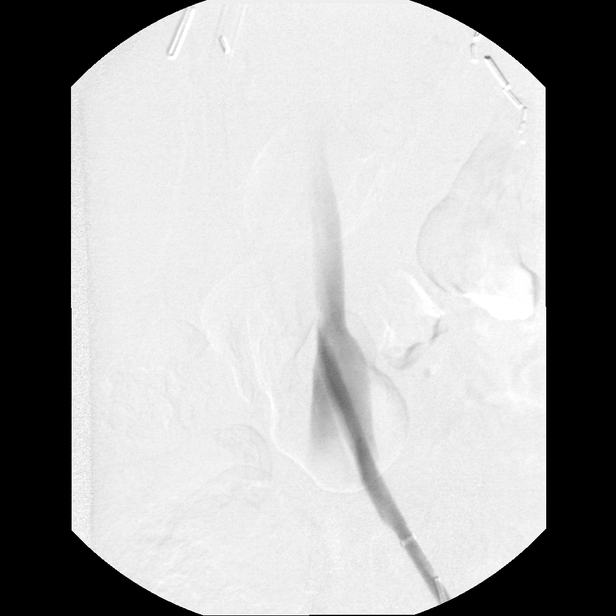
[im 22/32]
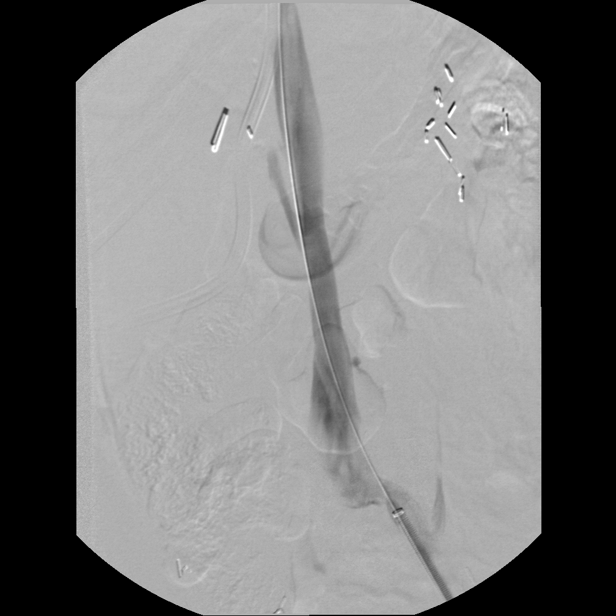
[im 25/32]
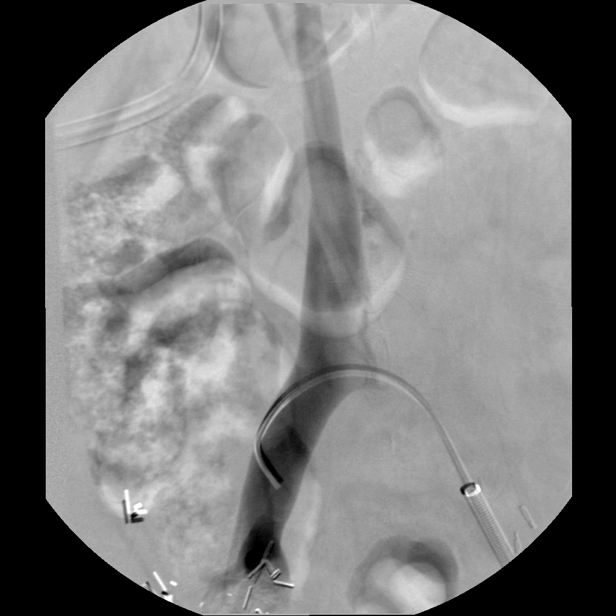
[im 27/32]
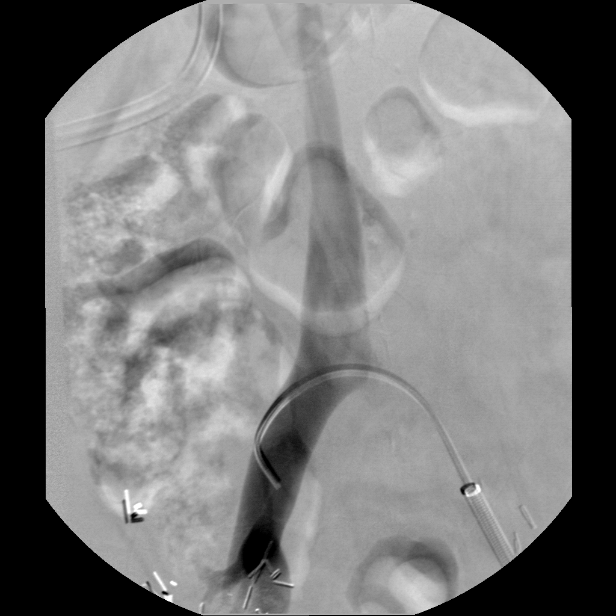
[im 30/32]
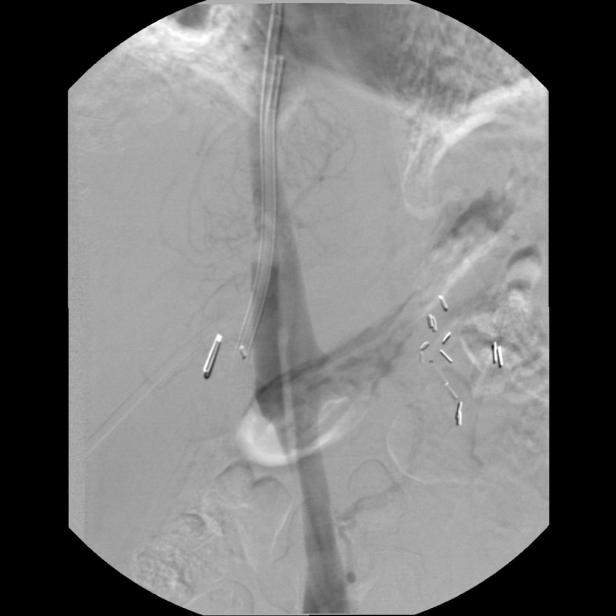

[11 of 24 positions shown; findings below may reference images not displayed]

CONTRAST:  50 cc Omnipaque 300.
FLUORO TIME:  17 minutes.
During the procedure conscious sedation was provided with IV Versed and fentanyl.
Prior to the procedure informed consent was obtained for all of the listed procedures.  The patient received 1 gram of IV Ancef prior to the prior procedure.  She also withheld Coumadin prior to the procedure.
Given the complexity of the various procedures performed a single complete procedural note will be dictated.
The patient was initially placed in a prone position with the right side up slightly.  The right translumbar region was sterilely prepped and draped.  Local anesthesia was provided with 1% lidocaine mixed with bicarb.  
Under fluoroscopic guidance needle access of the inferior vena cava was performed with a 21 gauge needle.  An indwelling dialysis catheter was used as a target under fluoroscopy.  Three separate sticks of the IVC were performed with subsequent placement of guidewires in attempted advancement of dilators.  Eventually a Micro-access 6 French dilator was advanced into the upper IVC and over a guidewire the venous access tract was dilated to 14 French.  A 15 French Peel-Away sheath was then placed.
Based on guidewire measurement a 32 cm (27 cm tip-to-cuff) Ash-Split tunneled dialysis catheter was chosen for placement.  This was brought through a subcutaneous tunnel from the right flank region and then inserted through a Peel-Away sheath at the venous access site.  The sheath was removed and final catheter positioning confirm with a fluoroscopic image.  The catheter was aspirated and flushed with saline and injected with a heparin dwell.  The venotomy incision was closed with subcuticular 3-0 monocril and application of Dermabond.  A retention suture was applied at the catheter exit site.
After the translumbar catheter placement the patient was flipped into a supine position and the left groin sterilely prepped.  The entire exiting left femoral dialysis catheter was also prepped.
A guidewire was advanced through the venous lumen of the catheter to the level of the right atrium.  The catheter was then removed over the wire utilizing blunt dissection to free a subcutaneous cuff.  After complete catheter removal a 10 French vascular sheath was advanced to the level of the left iliac vein.  A venogram was then performed of the venous outflow through to the level of the IVC.  
Balloon angioplasty of the left external vein and common iliac vein was then performed with a 10 mm x 4 cm angioplasty balloon.  Multiple inflations were performed.  Addition contrast venography was then performed through the sheath.  
The diagnostic catheter was further advanced to the level of the lower IVC and IVC venogram performed.  Balloon angioplasty of the lower half of the inferior vena cava was then performed with a 14 mm x 4 cm length angioplasty balloon.  Additional contrast venography was performed at the level of the lower IVC including runs obtained with a diagnostic catheter advanced into the right common iliac vein from a left-sided approach.  The sheath was removed and a dressing applied at the left groin site.
COMPLICATIONS:  None.
FINDINGS: There was difficulty in obtaining secure access into the IVC for translumbar catheter placement.  With the first two punctures of the IVC a small caliber guidewire was able to be advanced through a needle to the level of the right atrium.  However, a dilator was not advanced easily into the vein over the wire without inciting severe pain.  The first two access sites were therefore abandoned and finally on a third puncture of the IVC at roughly the L1-2 level dilator access was secured.  Eventually a tunneled Ash catheter was successfully placed with both lumen tips lying in the right atrium.  This catheter aspirates well after placement and is ready for immediate use.
The left femoral tunneled catheter was removed in its entirety.  Contrast injection through a sheath demonstrates a diffusely stenotic lumen of residual iliac vein extending into the IVC secondary to fibrin sheath and stenosis developing around the catheter.  Fibrin sheath material was then subsequently disrupted with a 10 mm angioplasty balloon resulting in improved patency of the left external iliac and common iliac veins with significantly improved patency present after angioplasty.  
The 80 percent lower IVC stenosis was then treated with 14 mm angioplasty.  This resulted in significant improvement with only minimal residual stenosis remaining.  Rapid flow is now present through this segment without visualization of collateral veins.  The rest of the IVC demonstrates a cast of filling defect in the mid to upper portion secondary to persistent fibrin sheath from the old femoral catheter.  This residual sheath material is not causing any significant flow disturbance.
IMPRESSION
1.  Successful placement of a right translumbar tunneled dialysis catheter directly into the IVC with the puncture site at roughly the L1-2 level.  Both catheter tips lie in the right atrium after placement and this catheter is ready for immediate use.
2.  Removal of left femoral tunneled catheter.
3.  Successful venous angioplasty of diffuse stenotic left external iliac and common iliac veins conforming to the previous catheter lumen and fibrin sheath material.  Additional balloon angioplasty of lower IVC stenosis resulted in significant improvement in stenosis with only minimal residual estimated 20-30 percent diffuse stenosis present in the lower segment of the IVC.  This is improved compared to roughly 80 percent stenosis present prior to angioplasty.

Copy:  Mirvydas [REDACTED].

## 2005-06-23 ENCOUNTER — Encounter (HOSPITAL_COMMUNITY): Admission: RE | Admit: 2005-06-23 | Discharge: 2005-09-21 | Payer: Self-pay | Admitting: Nephrology

## 2005-07-13 ENCOUNTER — Emergency Department (HOSPITAL_COMMUNITY): Admission: EM | Admit: 2005-07-13 | Discharge: 2005-07-13 | Payer: Self-pay | Admitting: Emergency Medicine

## 2005-07-13 IMAGING — CR DG CHEST 1V PORT
1 series · 1 of 1 positions shown · non-contrast
Comparison: 08/21/2002.

CLINICAL DATA: Short of breath.  
 PORTABLE CHEST ? 04/26/2003

[view not recorded]
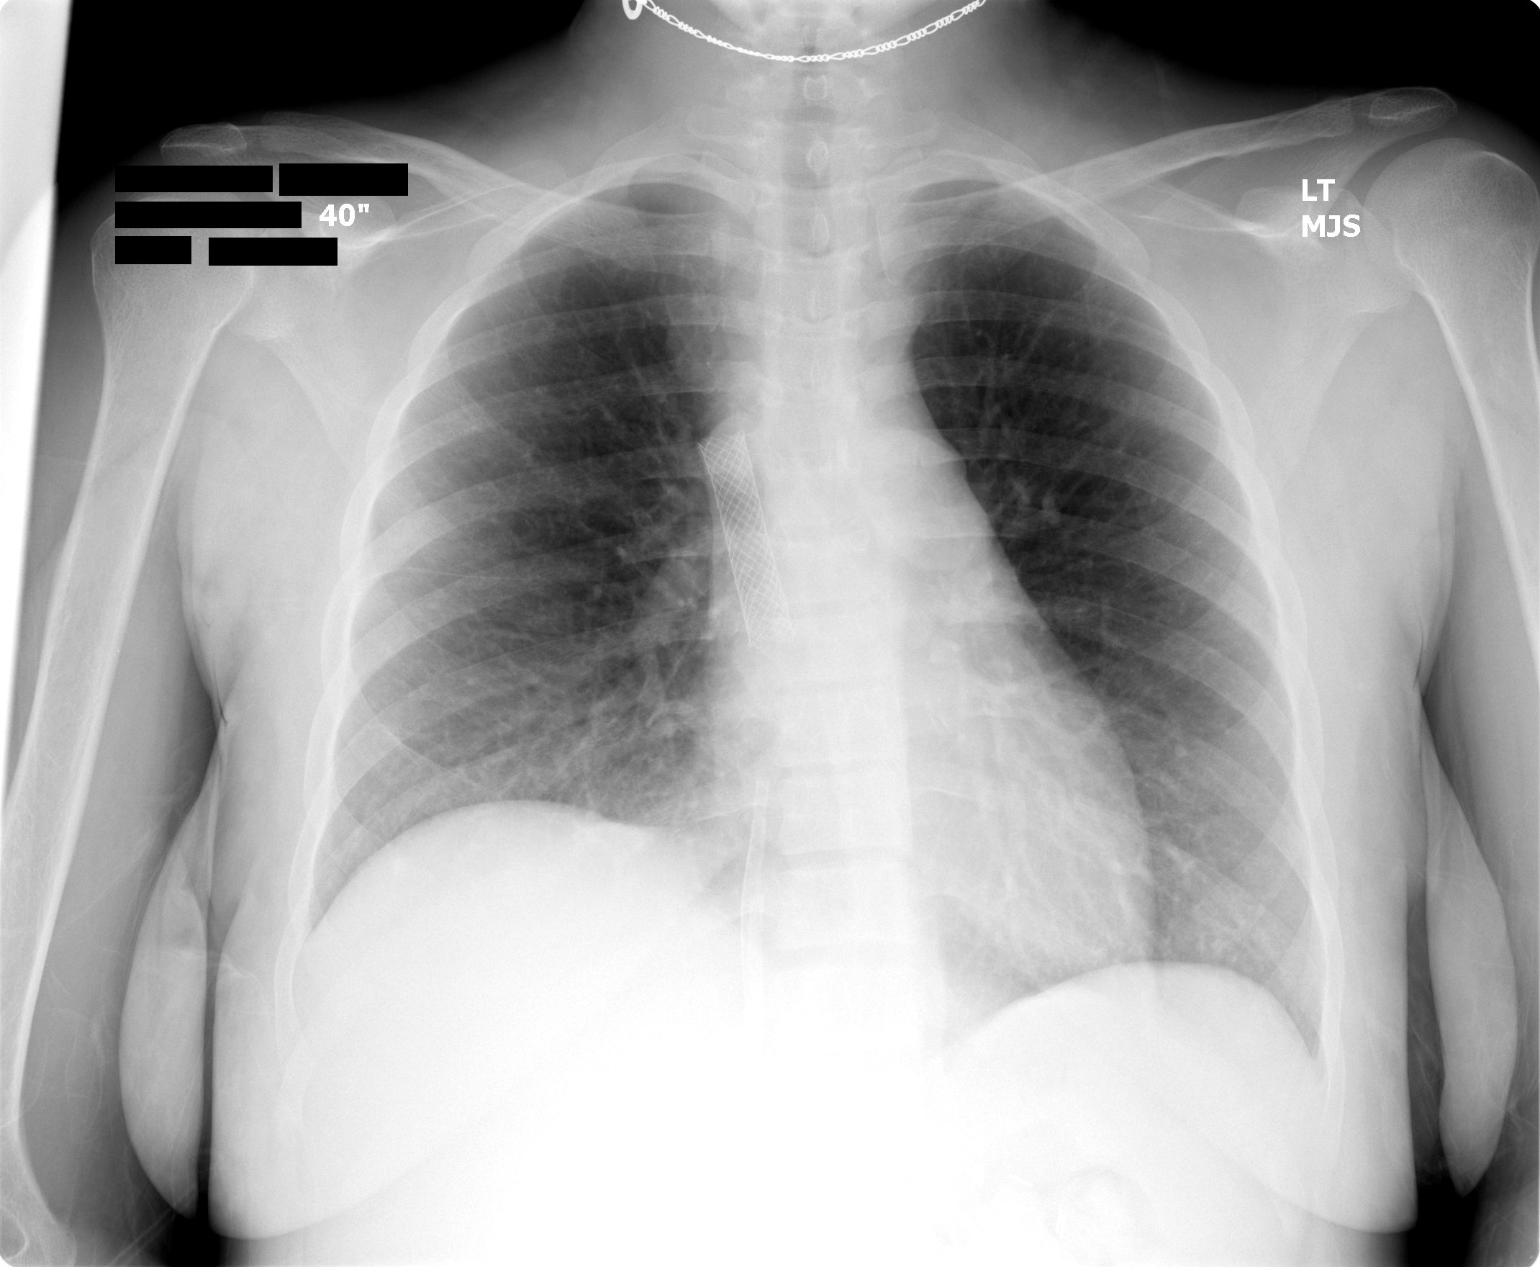

[1 of 1 positions shown; findings below may reference images not displayed]

FINDINGS: The heart is normal in size.  Elevation of the right hemidiaphragm with mild linear opacity at the right base is noted.  The left lung is clear.  No pneumothoraces or effusions are seen.  A dialysis catheter is seen in the IVC with the tip at the right atrium.  An SVC stent is noted.  
 IMPRESSION
 Mild linear atelectasis at the right base.

## 2005-07-14 IMAGING — US US ABDOMEN PORT
1 series · 13 of 25 positions shown · non-contrast
Comparison: none

CLINICAL DATA: Hypotension and lower abdominal pain.
 COMPLETE ABDOMINAL ULTRASOUND, 04/27/03
 Comparison CT of the abdomen dated 08/21/02 as well as reports from prior pelvic ultrasound study dated 10/20/02 and abdominal ultrasound study dated 08/21/02.  These ultrasound exams are not in the patient?s jacket, and imaging is not available for direct comparison.
 There is a complex cystic abnormality present in the pelvis just to the left of the midline and just above the pubic symphysis.  This measures approximately 6.2 x 5.4 x 6.3 cm by ultrasound and demonstrates thickened internal septation.  This is the region of previously described cystic abnormality which was of a fairly similar size as described on previous studies from [DATE] and [DATE].  No free fluid is seen surrounding this abnormality or elsewhere in the abdomen.  
 The liver, gallbladder, spleen, visualized pancreas, visualized aorta and IVC are within normal limits.  Native kidneys have been removed.  No biliary ductal dilatation with the common bile duct measuring 2.4 mm in diameter.
 IMPRESSION 
 Complex cystic mass in the pelvis just to the left of midline.  This has been previously described and likely is of similar size since prior studies.  Unfortunately, the prior ultrasound exams are not in the patient?s jacket.  However, based on the CT study performed in [DATE], this cystic abnormality does likely appear to be stable.  Otherwise normal abdominal ultrasound.

[Series 1: unknown · 0.30mm/px · 13 of 82 slices shown]
[im 1/82]
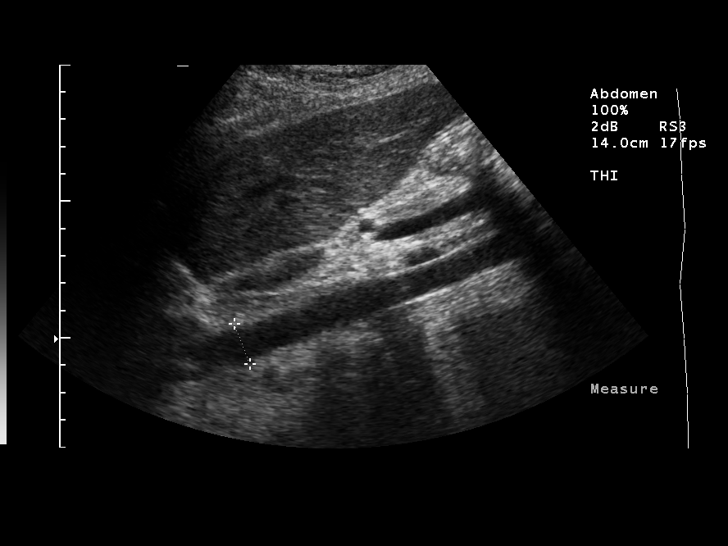
[im 7/82]
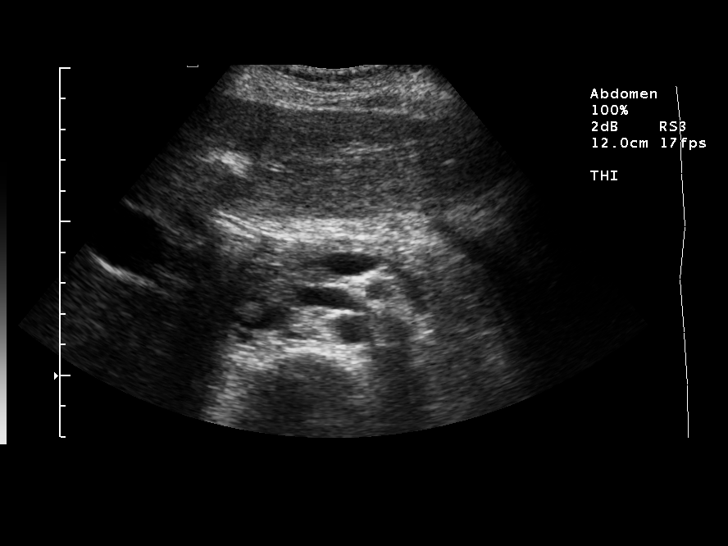
[im 14/82]
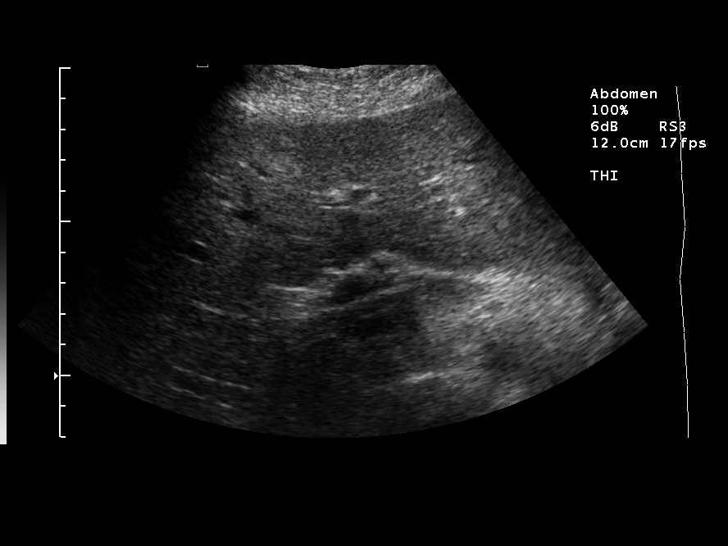
[im 21/82]
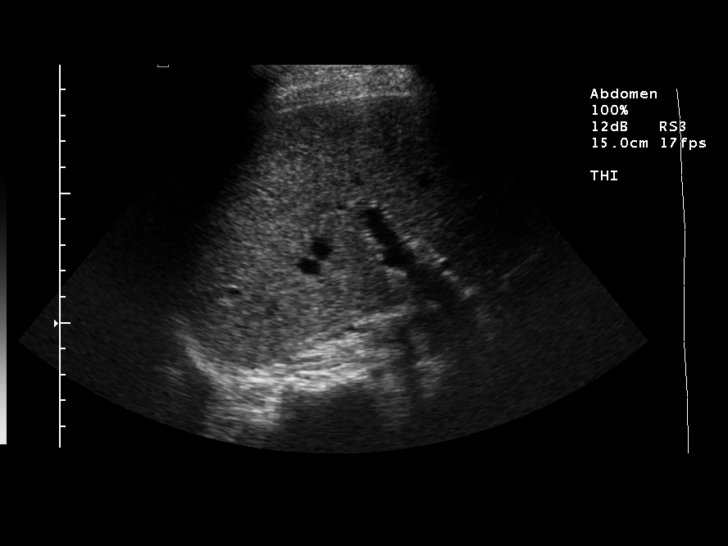
[im 28/82]
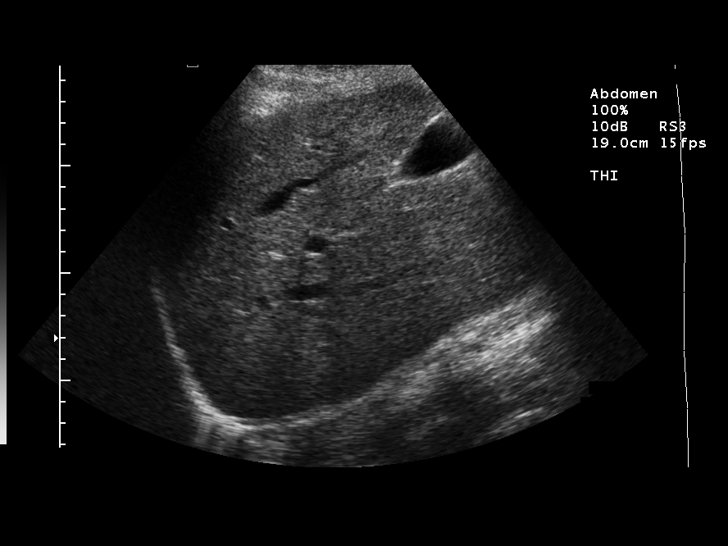
[im 34/82]
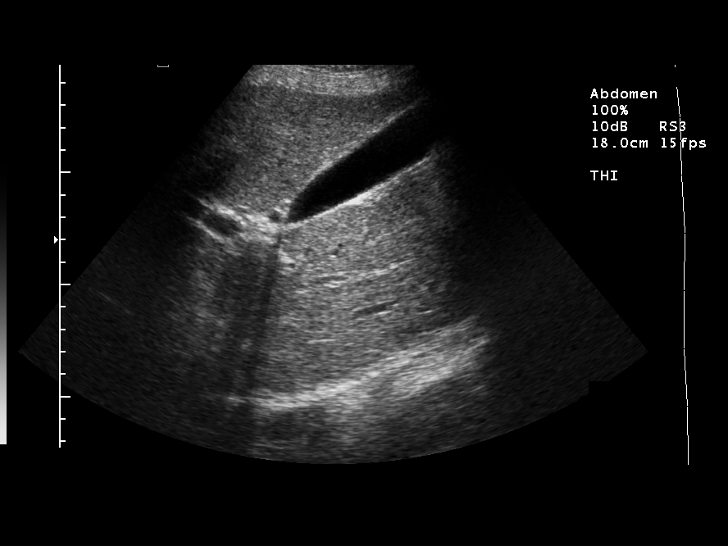
[im 41/82]
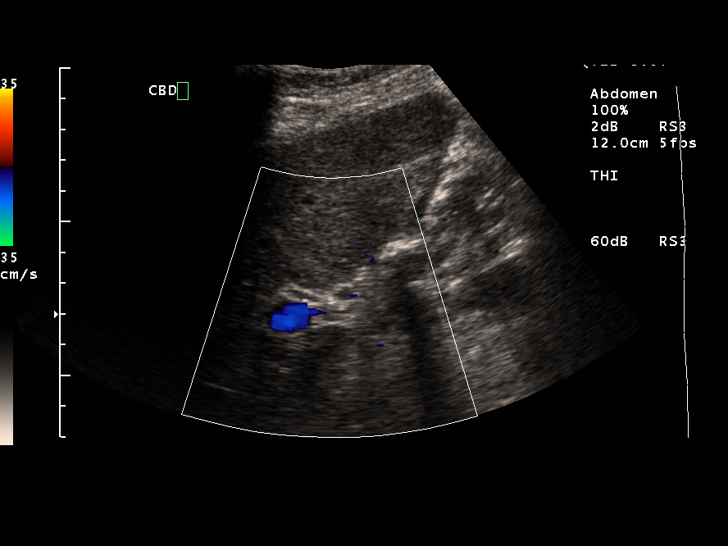
[im 48/82]
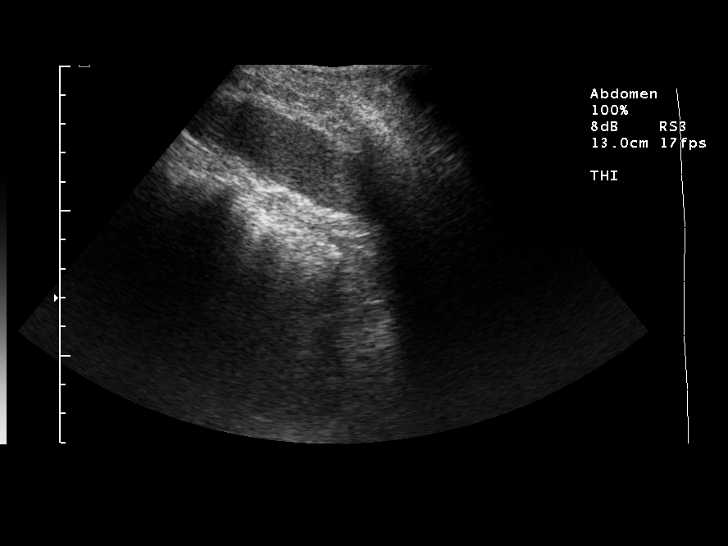
[im 55/82]
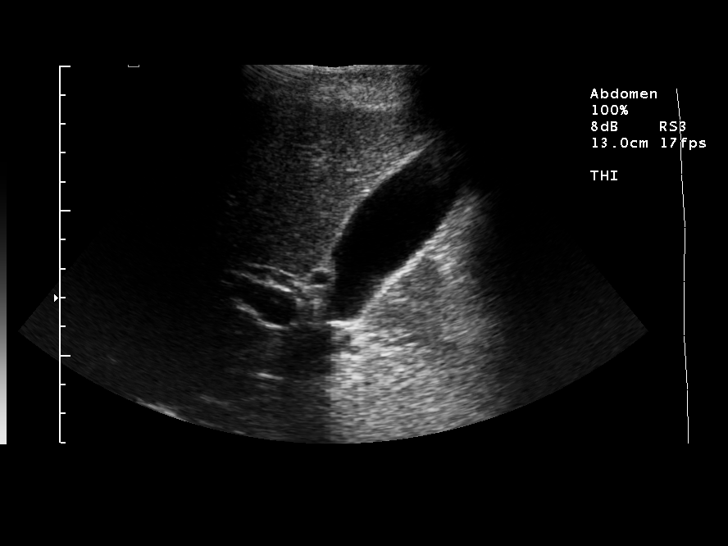
[im 61/82]
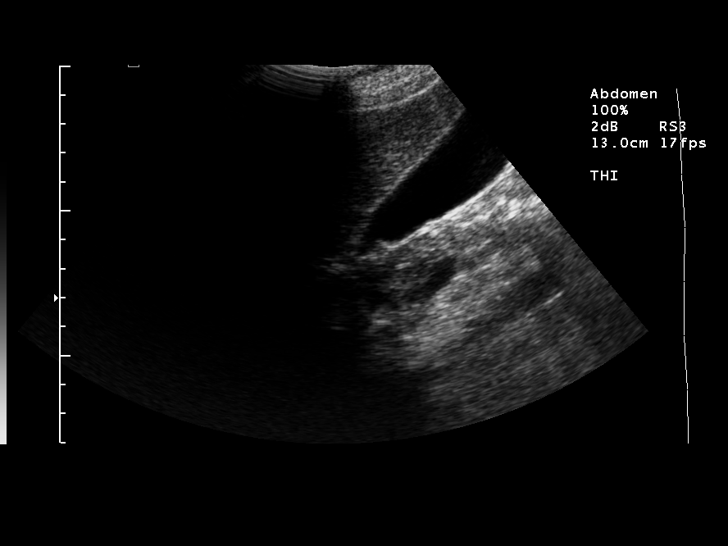
[im 68/82]
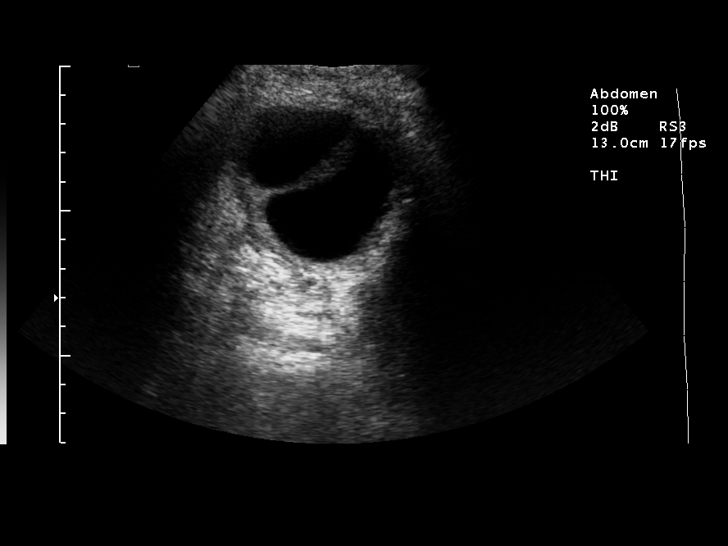
[im 75/82]
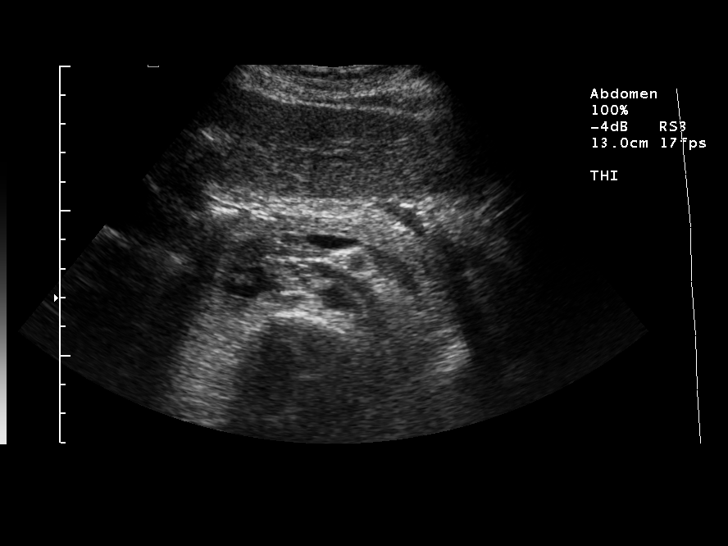
[im 82/82]
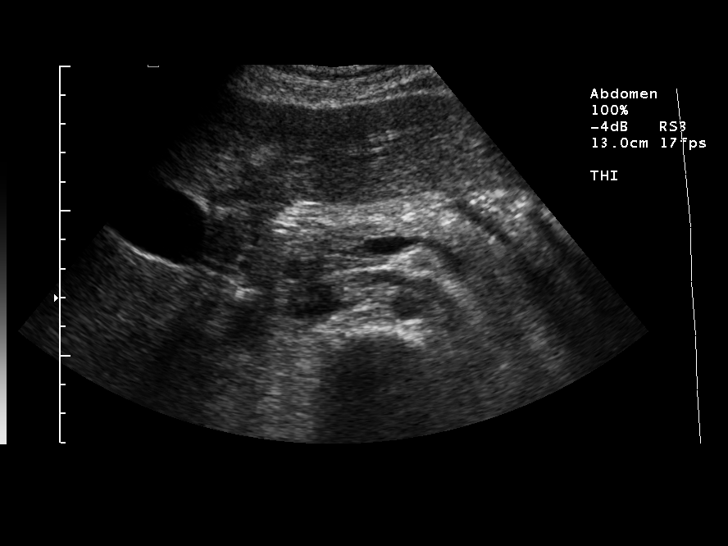

[13 of 25 positions shown; findings below may reference images not displayed]

## 2005-08-04 IMAGING — XA IR US GUIDE VASC ACCESS LEFT
1 series · 12 of 16 positions shown · IV contrast (omnipaque)
Comparison: none

CLINICAL DATA: The patient is status post placement of a translumbar tunneled IVC dialysis catheter on 03/09/03.  At that time, extensive balloon angioplasty was also performed of left-sided iliac vein secondary to stenosis as well as the lower inferior vena cava.  Since that time, she has developed painful lower extremity edema, left greater than right.  Catheter function has also worsened.  
Procedure:
ULTRASOUND-GUIDED ACCESS OF LEFT COMMON FEMORAL VEIN
LEFT LOWER EXTREMITY VENOGRAM
IVC VENOGRAM, 05/18/03
Contrast:  35 cc Omnipaque 300.
Fluoro time:  1.7 minutes.
Informed consent was obtained.  Conscious sedation was provided with IV Versed and Fentanyl.  The left groin was sterilely prepped and draped.  Local anesthesia was provided with 1% lidocaine.
Ultrasound was used to confirm patency of the left common femoral vein.  A 21-gauge needle was then advanced after making a small skin incision into the left femoral vein under direct ultrasound guidance.  The dilator was then advanced over a wire.  This was injected in performing left lower extremity venography and IVC venography.

[Series 1: run · 12 of 16 slices shown]
[im 1/16]
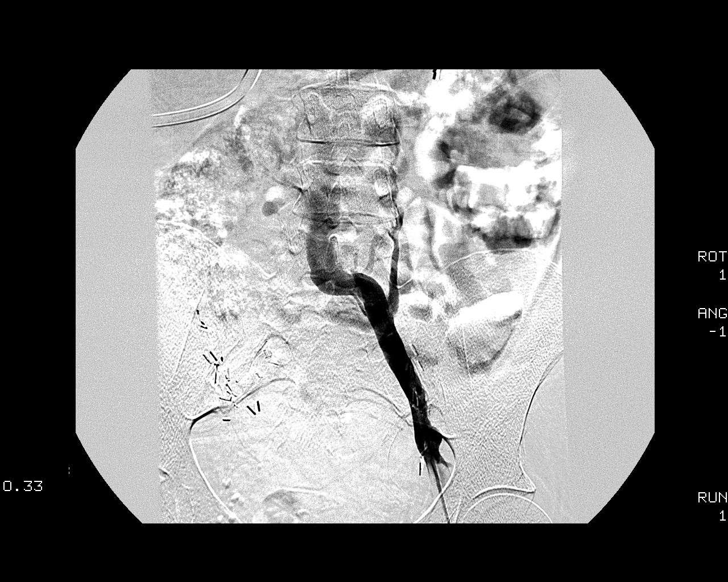
[im 3/16]
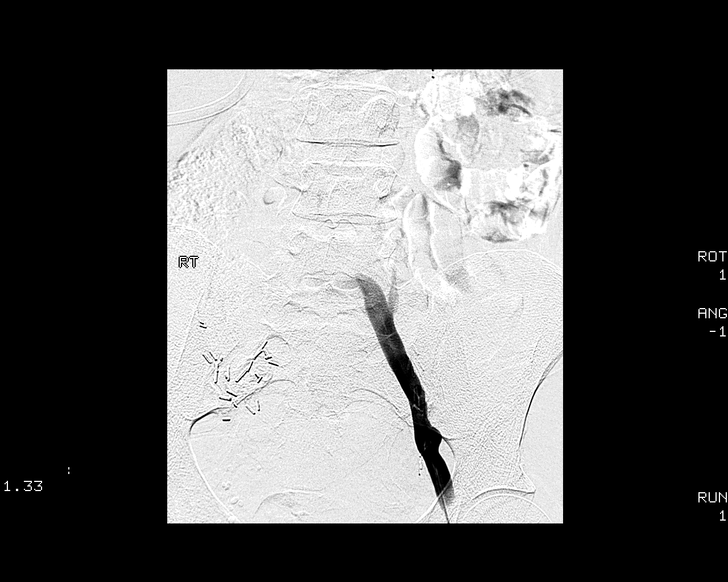
[im 4/16]
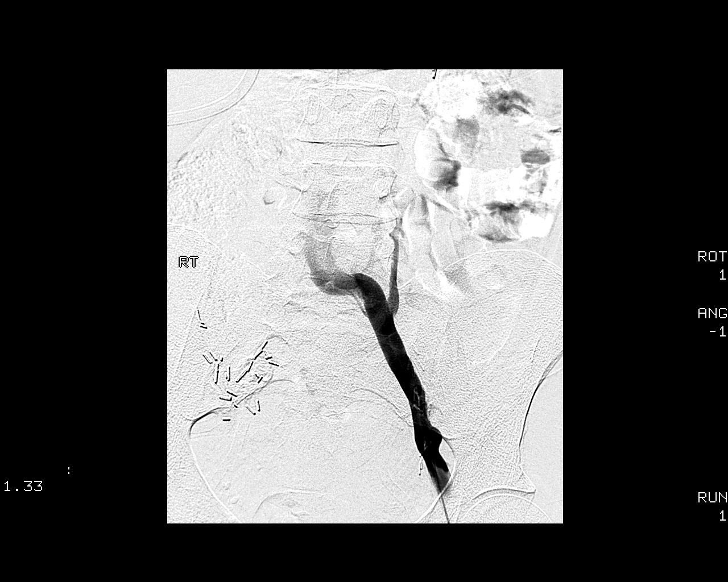
[im 5/16]
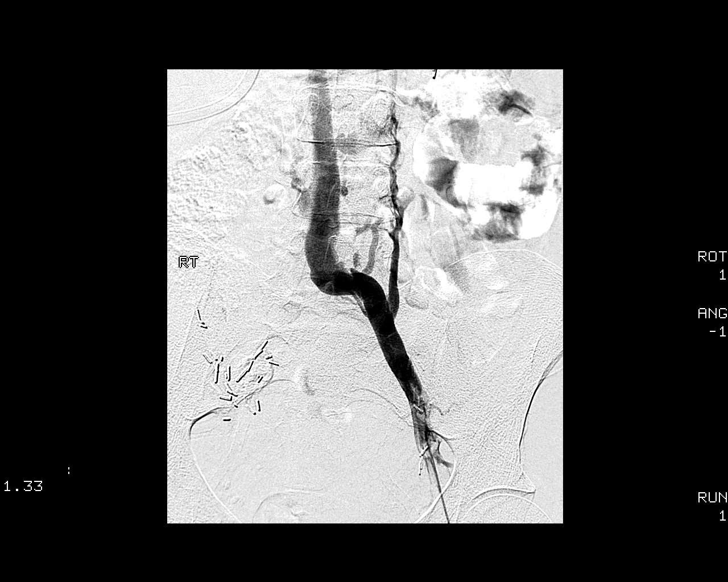
[im 7/16]
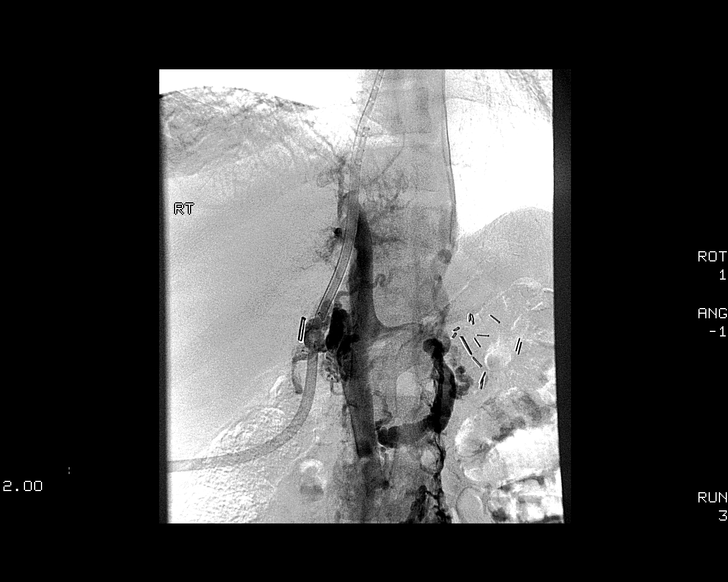
[im 8/16]
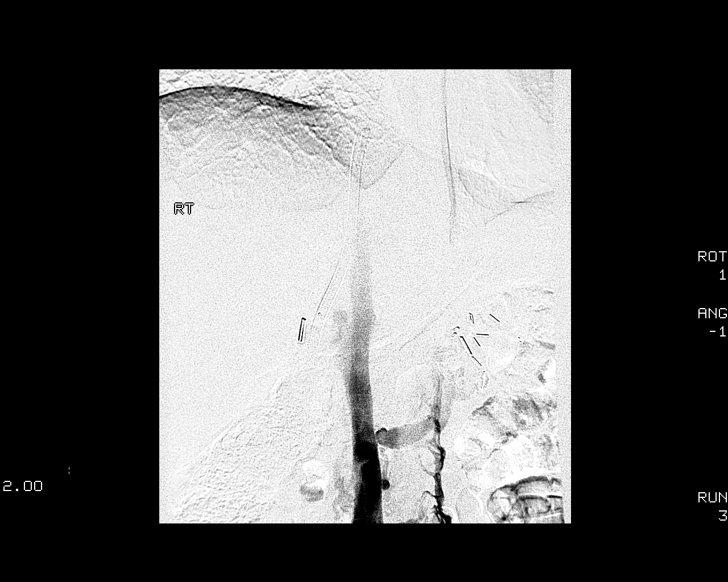
[im 9/16]
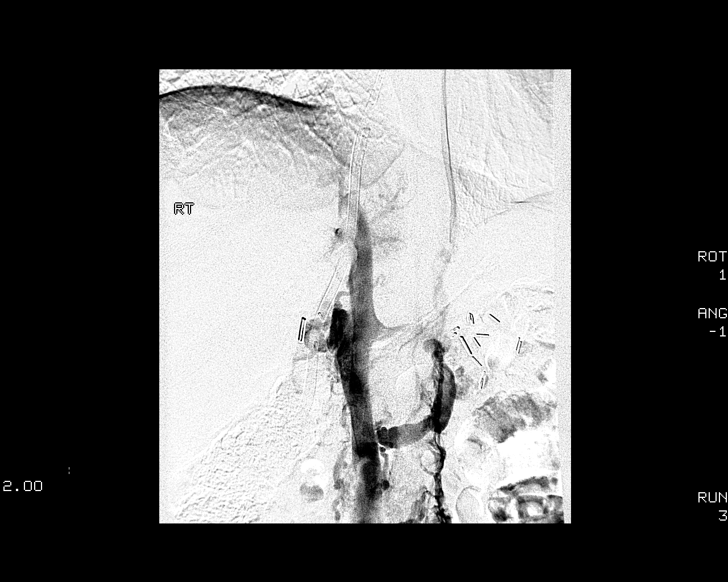
[im 11/16]
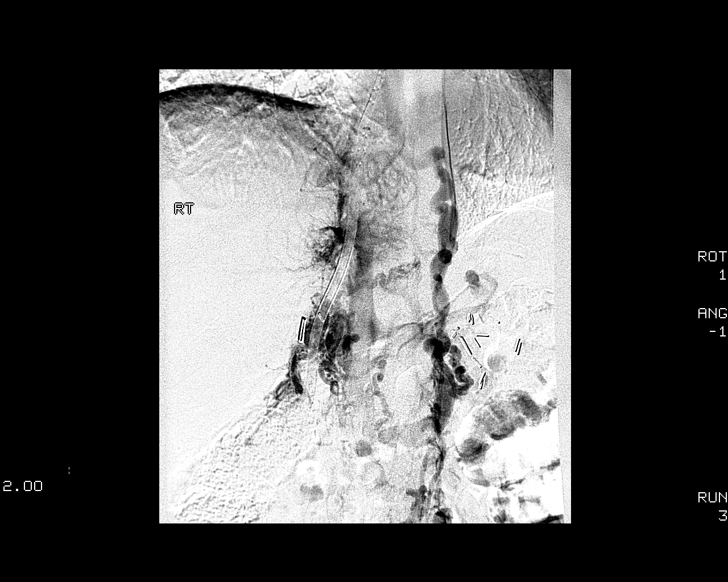
[im 12/16]
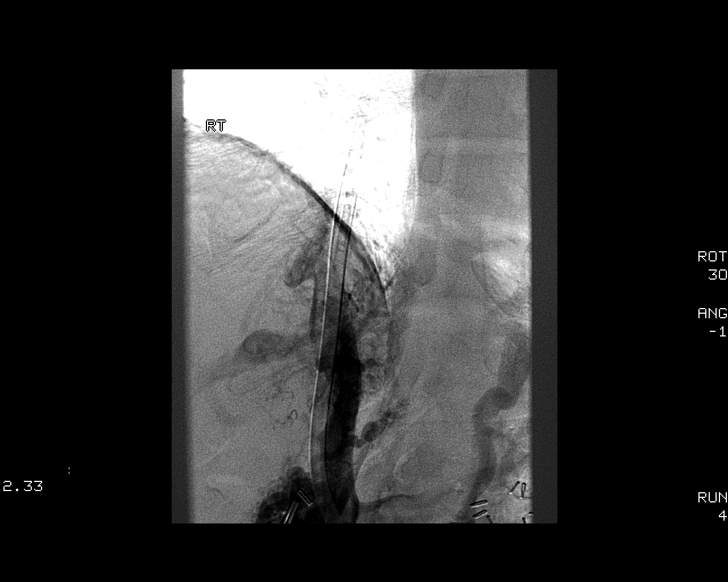
[im 13/16]
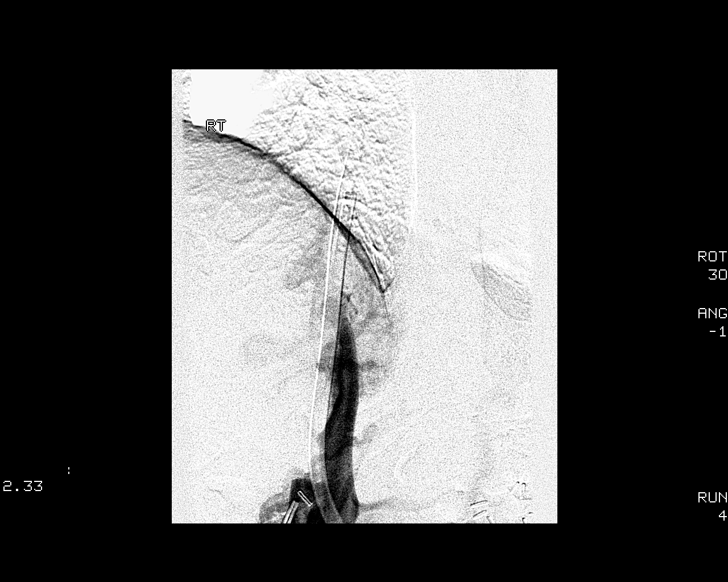
[im 15/16]
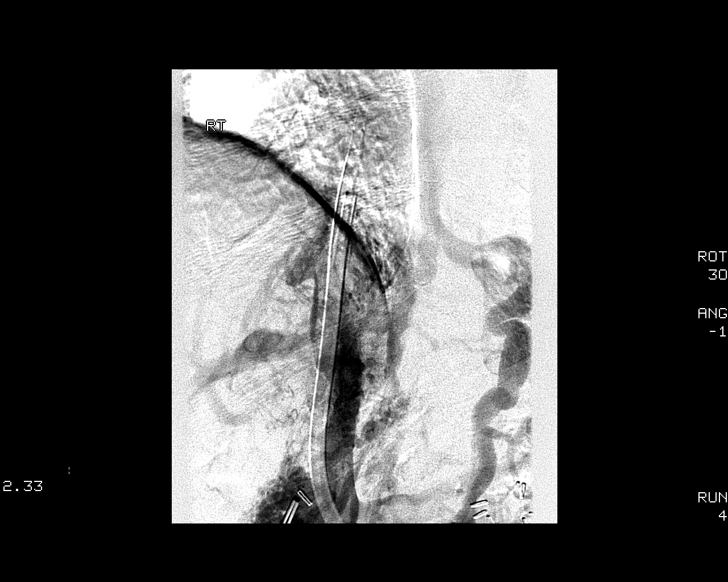
[im 16/16]
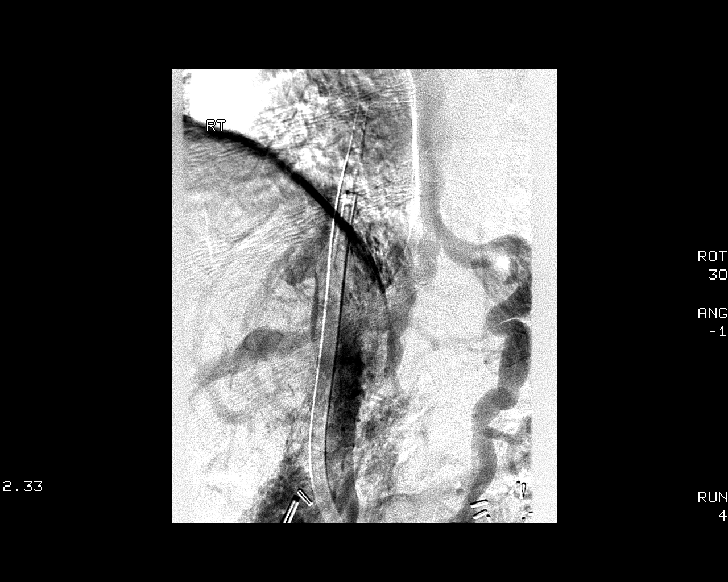

[12 of 16 positions shown; findings below may reference images not displayed]

FINDINGS: The left common femoral vein and all of the left iliac veins are widely patent.  Good outflow is present into the IVC.
Previously dilated segment of the lower IVC shows good patency with no evidence of recurrent stenosis.  Low to mid IVC is widely patent.  There is eventual filling of prominent collateral veins which fill paraspinous collaterals and eventually the hemiazygous system.  The presence of collaterals is secondary to a significant stenosis that has developed in the intrahepatic segment of the inferior vena cava at the level of the distal catheter.  Catheter is positioned such that the venous lumen tip lies in the lower right atrium and the arterial lumen tip lies at the IVC/right atrial junction.  The patient will need IVC angioplasty at the time of temporary removal of the translumbar catheter with replacement of the catheter after successful angioplasty.  Given time limitations as well as inability of the patient to tolerate an additional procedure today, it was decided to schedule a second procedure which will be performed at [HOSPITAL] on 05/23/03.     The patient was instructed to resume Coumadin today and [REDACTED].  She will then hold Coumadin starting [REDACTED] in anticipation of the procedure.  
IMPRESSION 
Widely patent left femoral and iliac veins.  The patient has developed a significant stenosis of the intrahepatic IVC around the existing translumbar catheter.  There has been development of substantial collateral veins.  The patient will need removal of the tunneled catheter over a guidewire followed by venous angioplasty of the inferior vena cava and replacement of the tunneled translumbar catheter.  This was scheduled to be performed at [HOSPITAL] on 05/23/03.  
cc:  Fg [REDACTED]

## 2005-08-09 IMAGING — XA IR CV CATH FLUORO GUIDE
1 series · 13 of 24 positions shown · non-contrast
Comparison: Prior lower extremity and IVC venogram performed at [HOSPITAL] on 05/18/03.

CLINICAL DATA: the patient has a history of end stage renal disease and is dialyzed through a tunneled translumbar IVC dialysis catheter.  Recently this catheter has shown malfunction and the patient has also developed progressive bilateral lower extremity edema.  Venogram performed on 05/18/03 at [HOSPITAL] has demonstrated stenosis around the distal catheter within the intrahepatic segment of the IVC.  
PROCEDURES: 1) IVC VENOGRAM FOLLOWING REMOVAL OF PREEXISTING TRANSLUMBAR DIALYSIS CATHETER OVER A GUIDEWIRE; 2) VENOUS ANGIOPLASTY OF INFERIOR VENA CAVA; REPLACEMENT OF TUNNELED CENTRAL VENOUS DIALYSIS CATHETER UNDER FLUOROSCOPIC GUIDANCE ? 05/23/03

[Series 1000: run · 0.19mm/px · 13 of 26 slices shown]
[im 1/26]
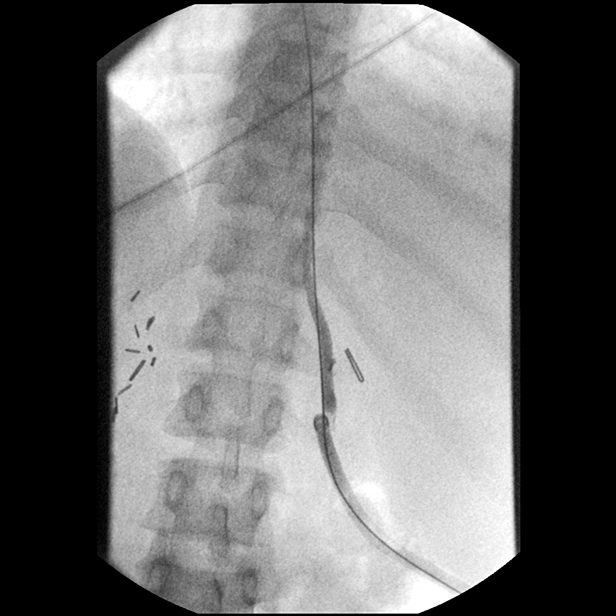
[im 3/26]
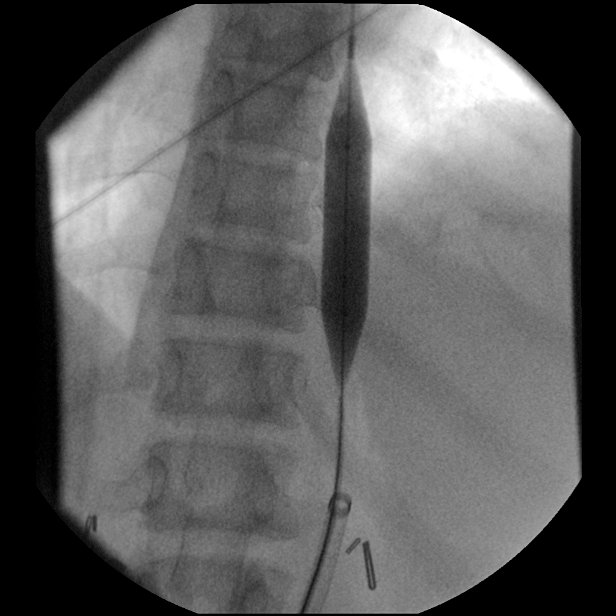
[im 5/26]
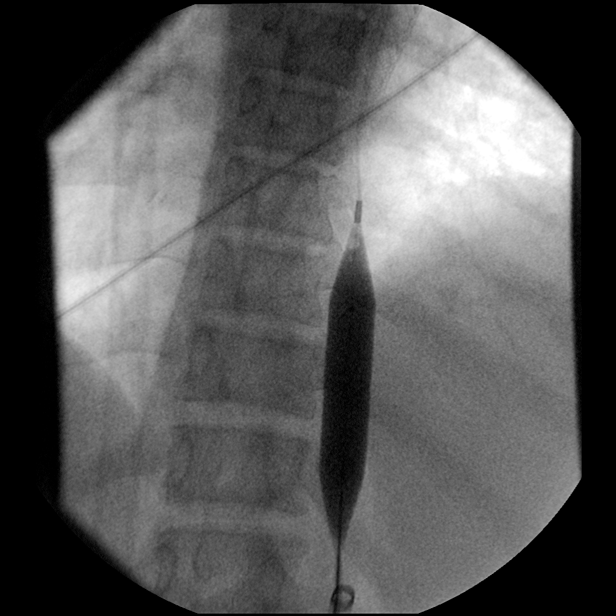
[im 7/26]
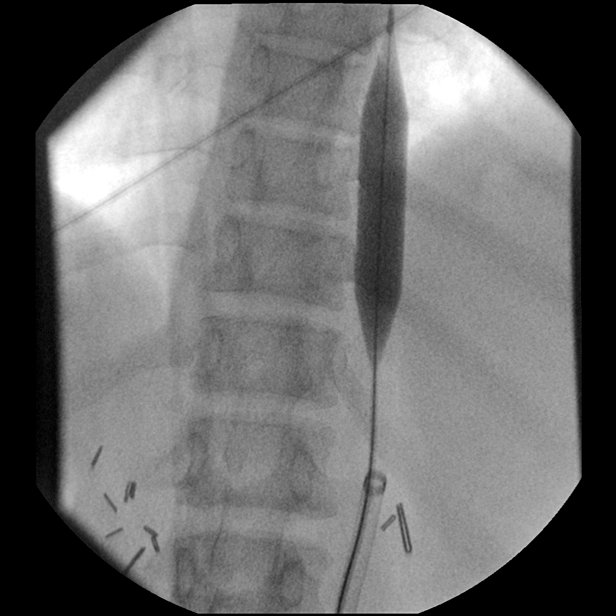
[im 9/26]
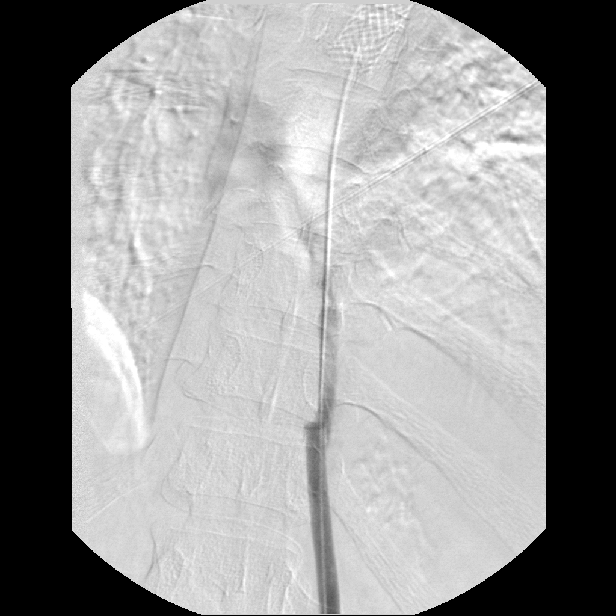
[im 11/26]
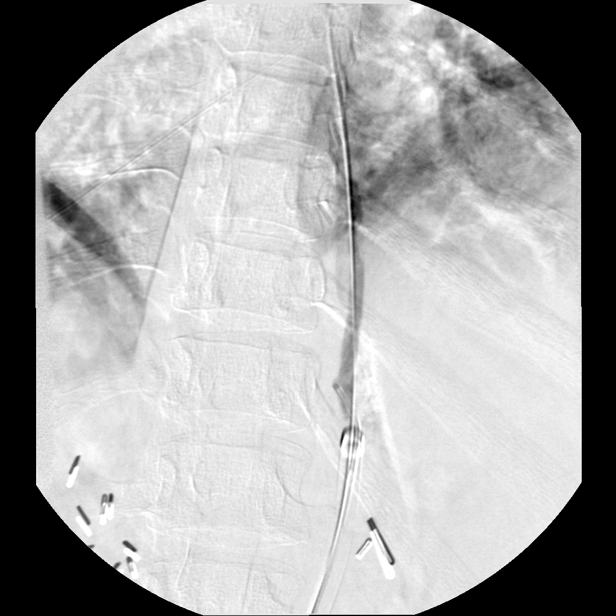
[im 14/26]
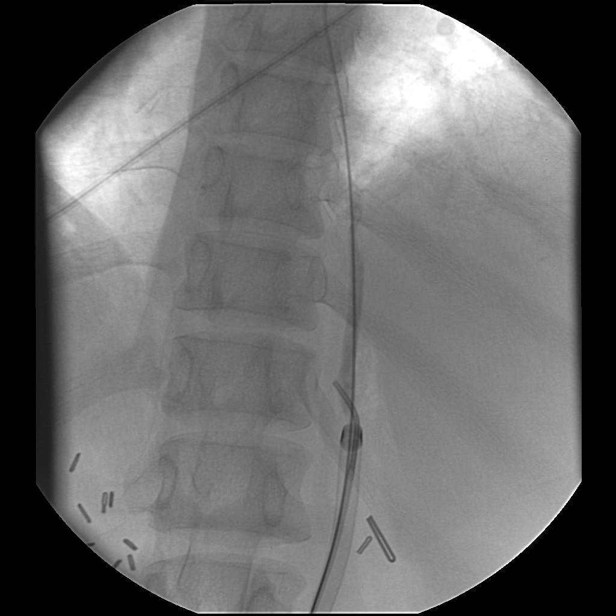
[im 15/26]
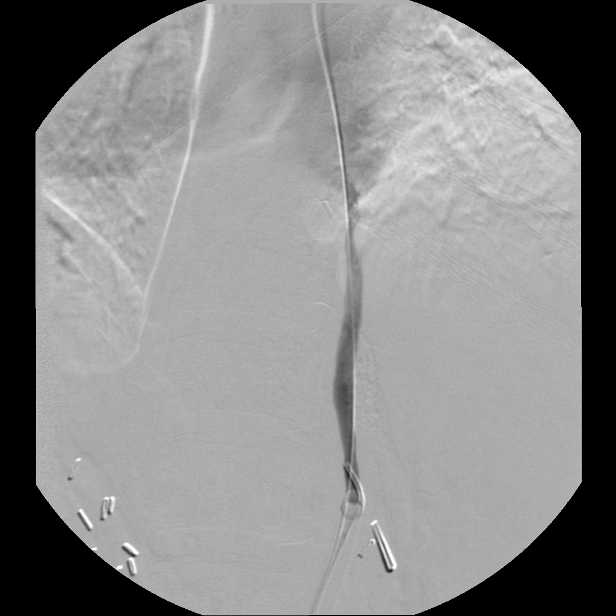
[im 17/26]
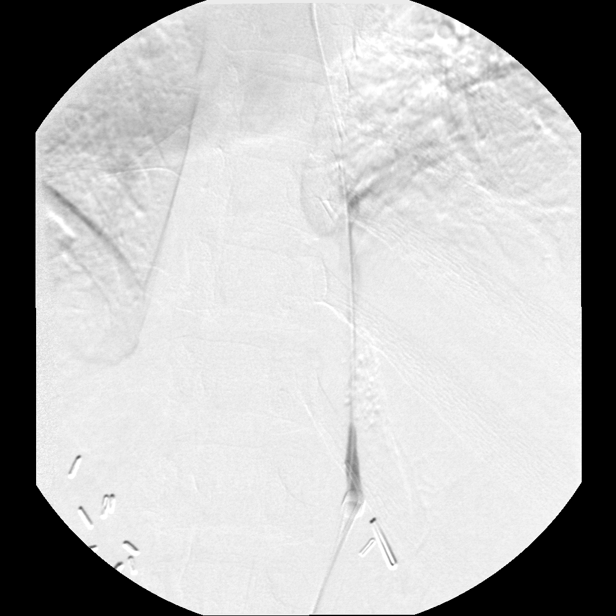
[im 19/26]
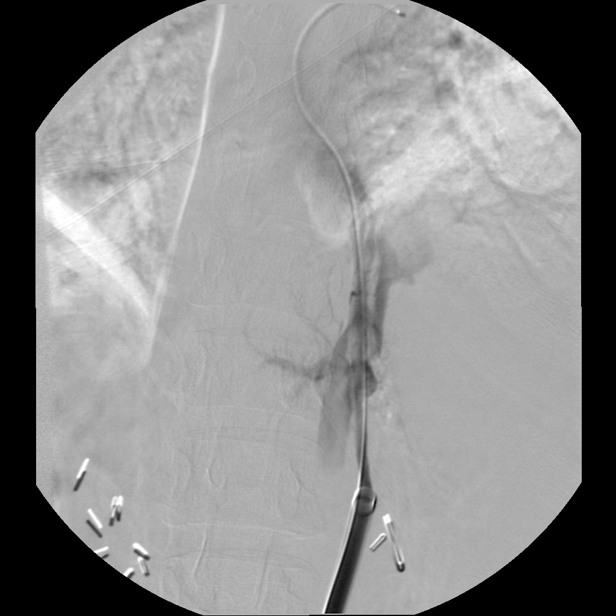
[im 21/26]
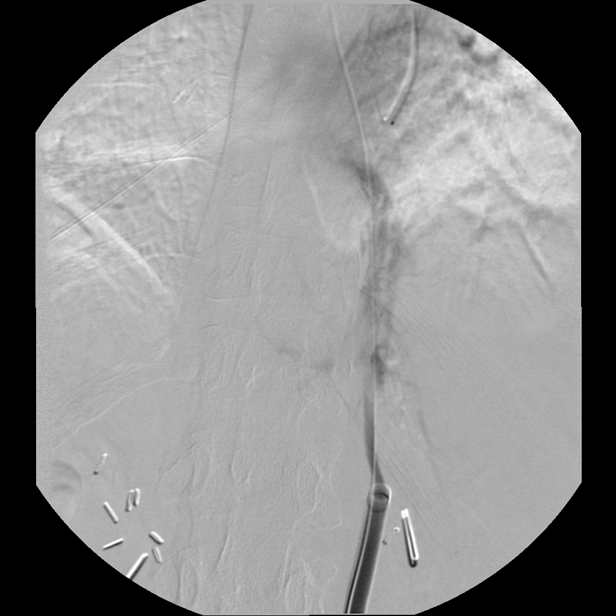
[im 23/26]
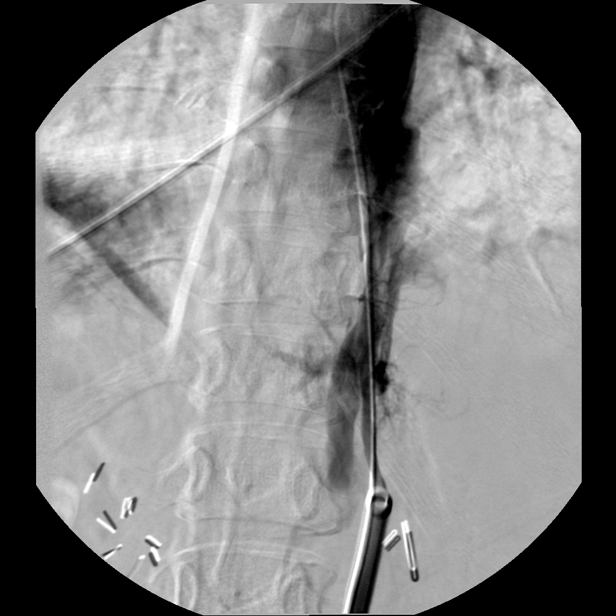
[im 26/26]
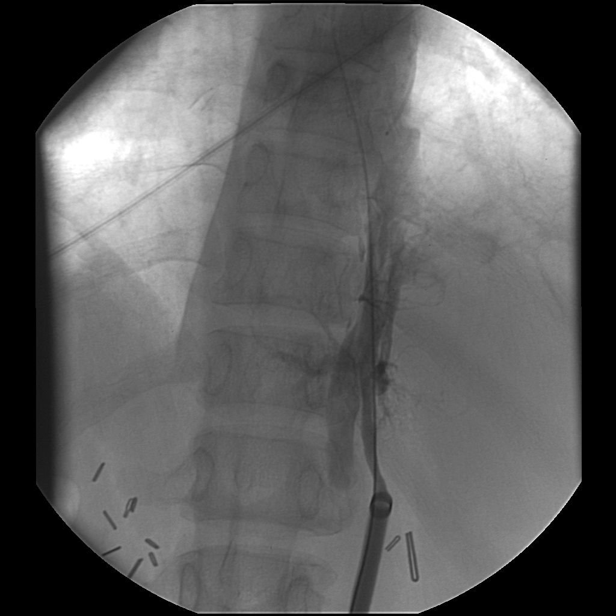

[13 of 24 positions shown; findings below may reference images not displayed]

CONTRAST: 50 cc of Omnipaque 300. 
FLUORO TIME: 8. 7 minutes. 
MEDICATIONS: 1 gm IV Ancef given prior to the procedure.  8 mg IV Versed and 250 mcg IV Fentanyl given during procedure. 
All of the above procedures will be combined into a single procedural note.  
The patient was placed in the prone position and the pre-existing right-sided translumbar tunneled dialysis catheter was sterilely prepped and draped.   During the procedure, local anesthesia was provided with 1 percent lidocaine.  
Utilizing blunt dissection, the subcutaneous cuff of the preexisting tunneled dialysis catheter was free.  The catheter was then removed over a guidewire which was advanced under fluoroscopy into the right atrium.  A 14 french vascular sheath was then advanced into the inferior vena cava.  A contrast venogram was performed through the sheath. The sheath was then retracted and a diagnostic catheter positioned in the IVC.  Additional venogram was performed. 
Balloon angioplasty of the upper segment of the inferior vena cava was performed with a 12 mm x 4 cm Atlas high-pressure balloon.  Several inflations were performed across the entire segment of the upper IVC extending into the right atrium.  The balloon was deflated and removed and follow-up venography performed through the sheath.   
Two guide wires were advanced into the right atrium.  A new tunneled Maple Hemosplit XK catheter was chosen for placement.  The catheter placed is 31 cm from tip to cuff.  Spot images obtained with fluoroscopy to confirm catheter position.  The catheter was aspirated and flushed with saline and injected with high dose heparin.  A Prolene retention suture was applied at the catheter exit site.  
COMPLICATIONS:  None.
FINDINGS: After removal of the pre-existing dialysis catheter, both sheath and catheter contrast injection show filling of only a narrow lumen corresponding to the catheter diameter and consistent with filling of a fibrin sheath.  There also is known component of true stenosis of the upper IVC based on the prior venogram performed on 05/18/03.  After several balloon dilatations at 12 mm throughout the level of the upper inferior vena cava, a venography shows improved patency and flow with some residual tubular filling defects seen likely corresponding to residual fibrin sheath material in the vein.  
Given development of upper IVC stenosis with the prior catheter, it was decided to replace the catheter with the next longest length which is 4 cm longer.  This would allow both lumen tips to lie well into the right atrium rather than the original positioning where the arterial lumen tip lies just at the juncture between the IVC and the right atrium.  This new catheter was positioned such that the catheter tips lie well into the upper right atrium; with cardiac monitoring after placement, there is no evidence of ectopy or arrhythmia. 
IMPRESSION 
After dialysis catheter removal over a guidewire, contrast injection shows filling of a narrow fibrin sheath.  The sheath and underlying IVC stenosis were treated with 12 mm angioplasty resulting in improved patency of the intrahepatic IVC.  There is irregular filling defect remaining in the vein, likely related to residual fibrin material which had surrounded the previous catheter.  A new 31 cm tip to cuff length Hemosplit catheter was placed and advanced such that both lumen tips lie well into the right atrium.  The longer length was specifically chosen, given development of upper IVC stenosis with the previous catheter which measured 27 cm from tip to cuff.   
Cc:  Pelivni [REDACTED]

## 2005-09-03 ENCOUNTER — Emergency Department (HOSPITAL_COMMUNITY): Admission: EM | Admit: 2005-09-03 | Discharge: 2005-09-03 | Payer: Self-pay | Admitting: Emergency Medicine

## 2005-10-02 ENCOUNTER — Encounter (HOSPITAL_COMMUNITY): Admission: RE | Admit: 2005-10-02 | Discharge: 2005-12-31 | Payer: Self-pay | Admitting: Nephrology

## 2006-01-27 ENCOUNTER — Encounter (HOSPITAL_COMMUNITY): Admission: RE | Admit: 2006-01-27 | Discharge: 2006-04-27 | Payer: Self-pay | Admitting: Nephrology

## 2006-04-14 ENCOUNTER — Emergency Department (HOSPITAL_COMMUNITY): Admission: EM | Admit: 2006-04-14 | Discharge: 2006-04-15 | Payer: Self-pay | Admitting: Emergency Medicine

## 2006-04-30 ENCOUNTER — Encounter (HOSPITAL_COMMUNITY): Admission: RE | Admit: 2006-04-30 | Discharge: 2006-07-29 | Payer: Self-pay | Admitting: Nephrology

## 2006-06-01 IMAGING — CR DG CHEST 2V
2 series · 2 of 2 positions shown · non-contrast
Comparison: 04/26/03.

CLINICAL DATA: Pain in left shoulder.
 PA AND LATERAL CHEST:

[view not recorded (1 of 2)]
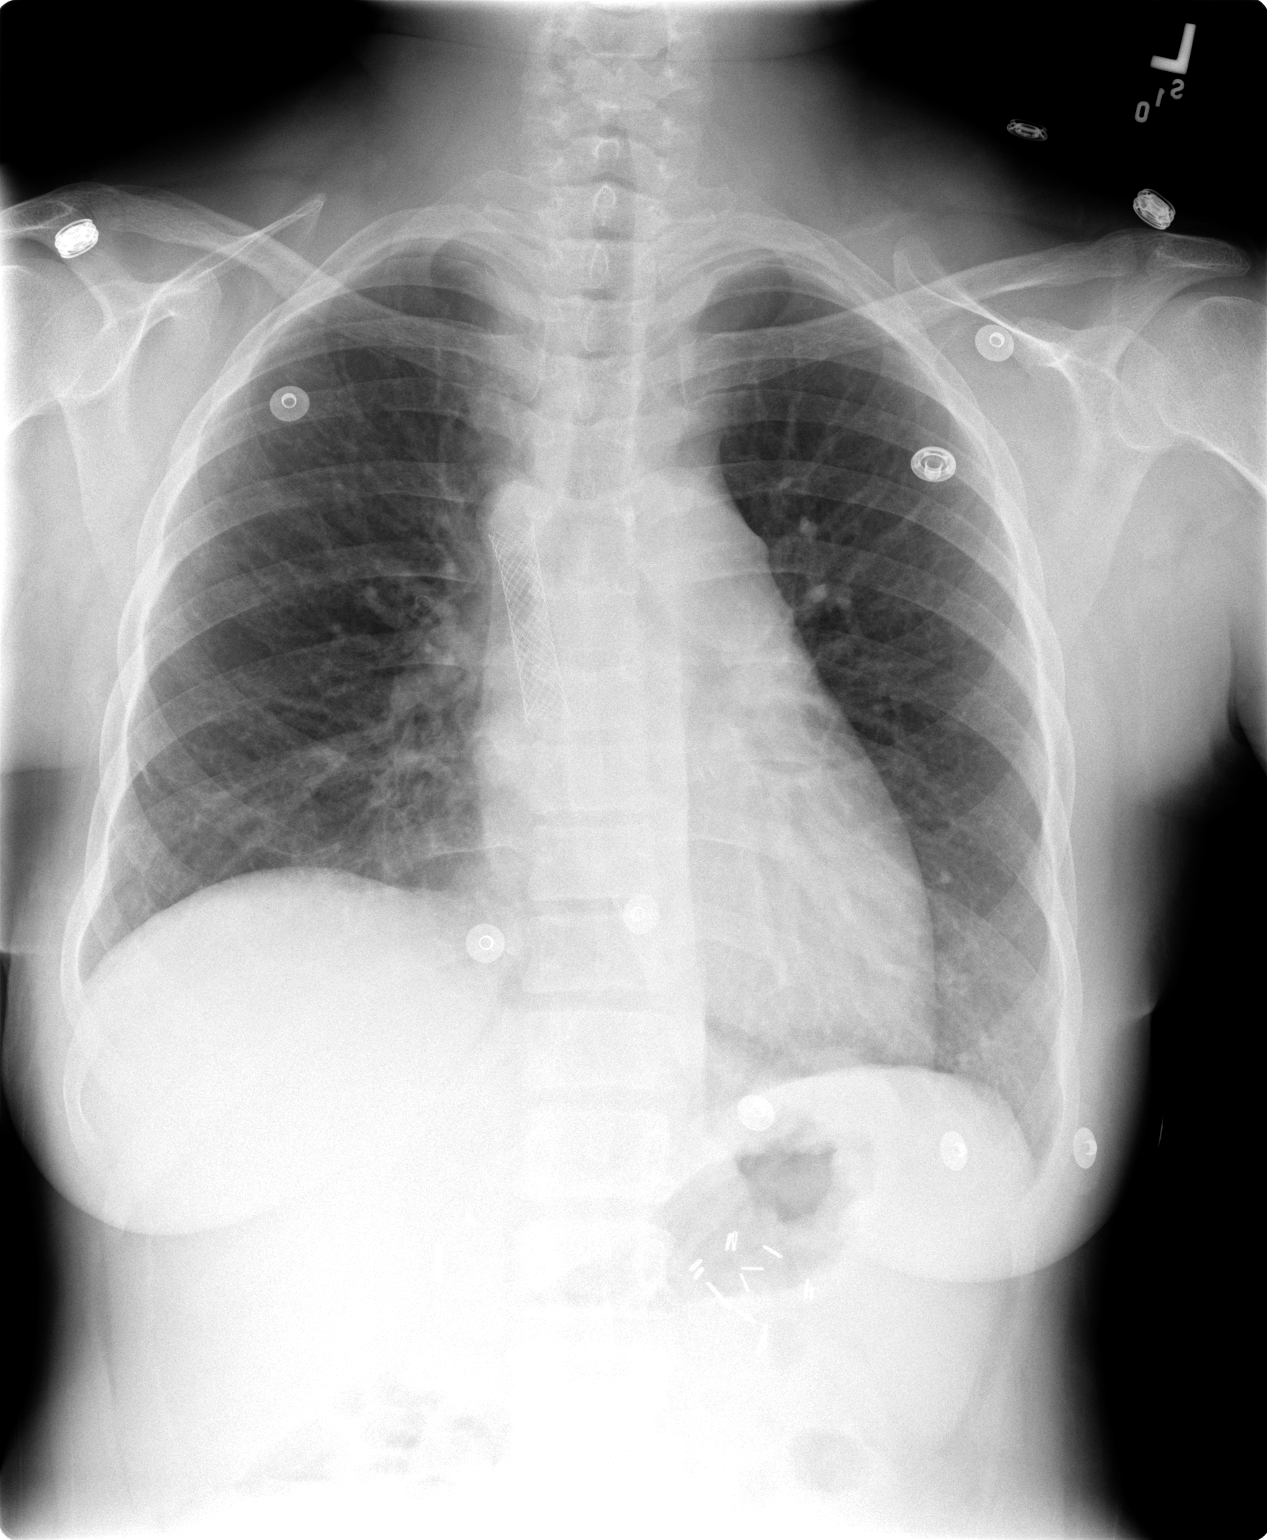

[view not recorded (2 of 2)]
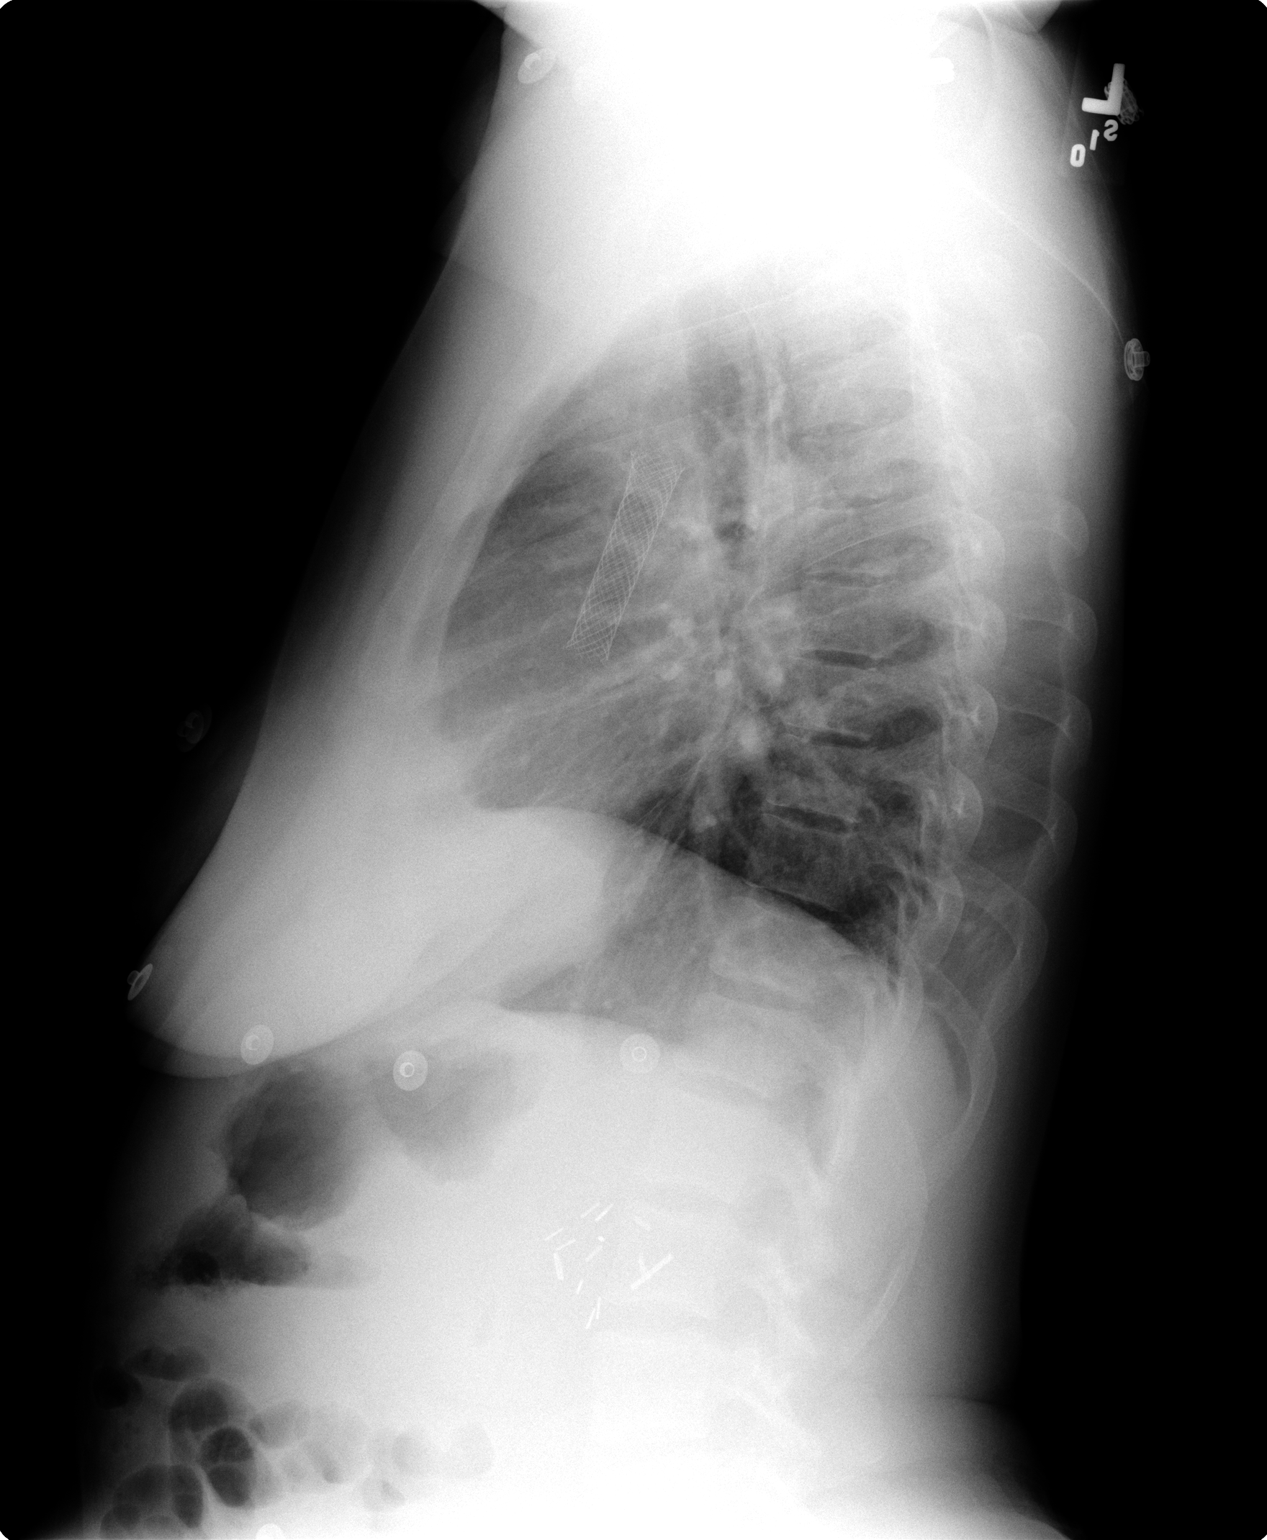

[2 of 2 positions shown; findings below may reference images not displayed]

FINDINGS: There is unchanged elevation at the right hemidiaphragm.  Lungs are clear.  No pleural effusion.  Heart size upper normal.  SVC stent and clips in the upper abdomen again noted.
IMPRESSION: No acute disease.

## 2006-06-05 IMAGING — CT CT ABDOMEN W/O CM
1 of 2 series · 14 of 32 positions shown, 18 images · non-contrast
Comparison: Pelvic ultrasound 04/27/2003.

CLINICAL DATA: History of renal transplant (right side of pelvis). Prior native
bilateral nephrectomy. Chronic pelvic fluid collection.

CT OF THE ABDOMEN AND PELVIS WITHOUT CONTRAST  03/18/2004:
TECHNIQUE: Multidetector helical CT through the abdomen and pelvis was performed
without intravenous contrast. Oral contrast was given.

[Series 2: abd/pelvis 5.0 b30f · axial · 0.60mm/px · z∈[+706,+1070]mm · 14 of 81 slices shown, 18 images]
[im 4/81  soft-tissue]
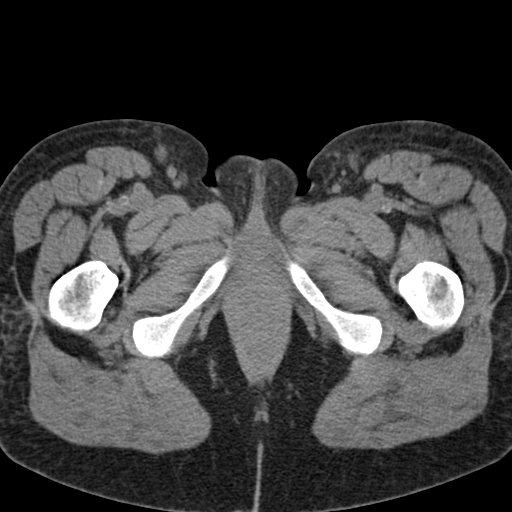
[im 4/81  bone]
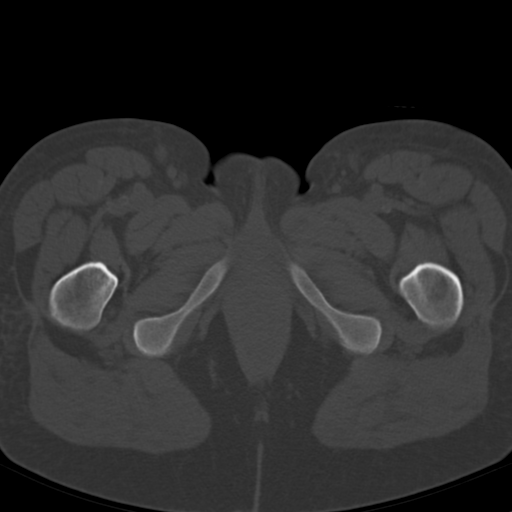
[im 11/81  soft-tissue]
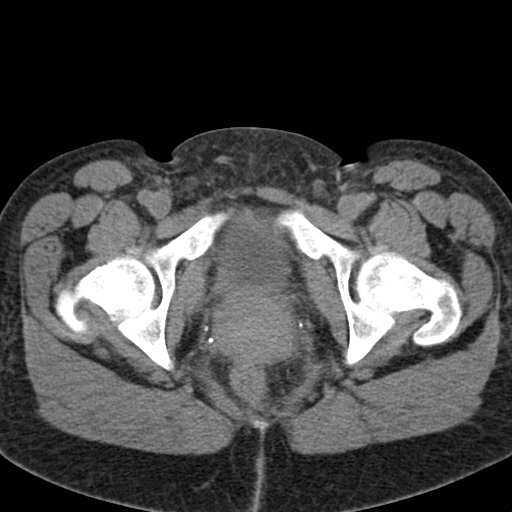
[im 18/81  soft-tissue]
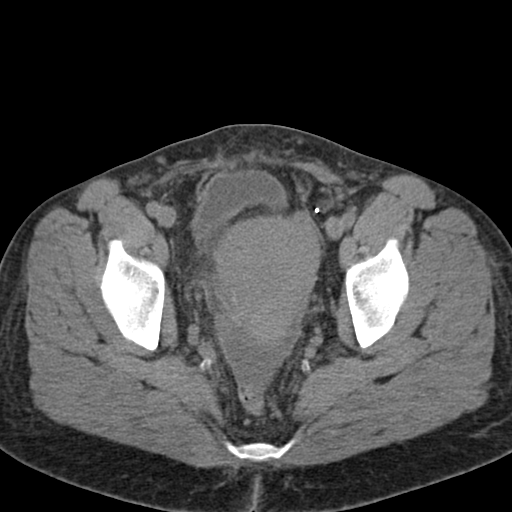
[im 25/81  soft-tissue]
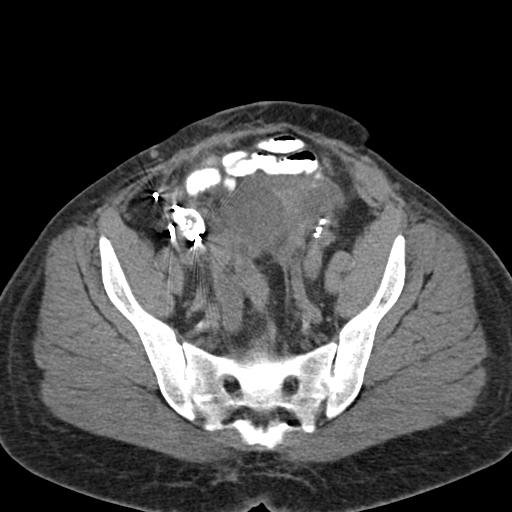
[im 32/81  soft-tissue]
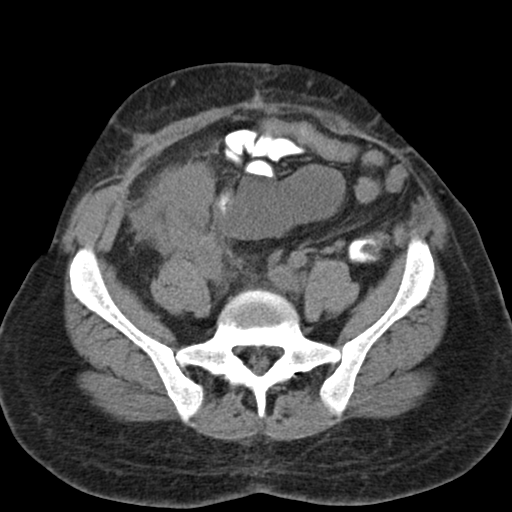
[im 39/81  soft-tissue]
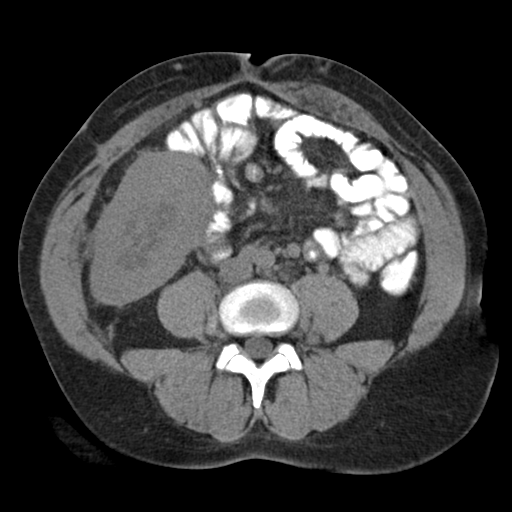
[im 42/81  soft-tissue]
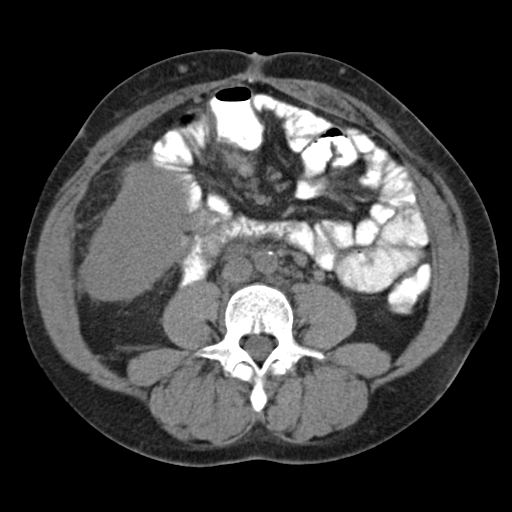
[im 49/81  soft-tissue]
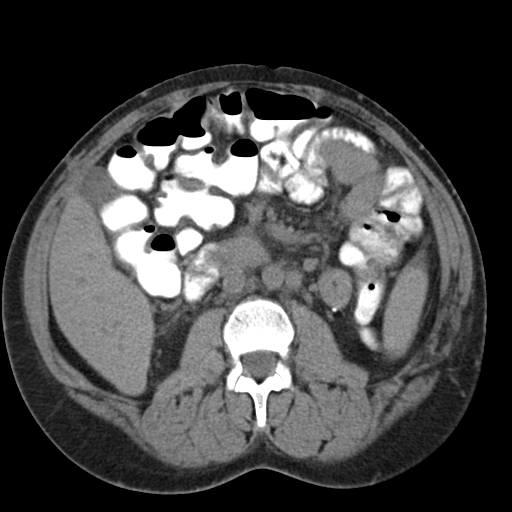
[im 56/81  soft-tissue]
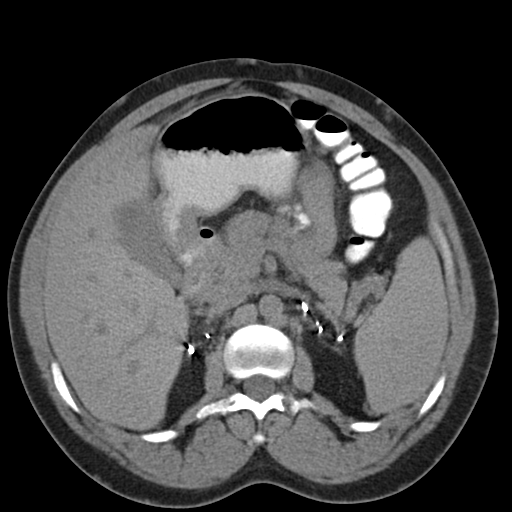
[im 56/81  bone]
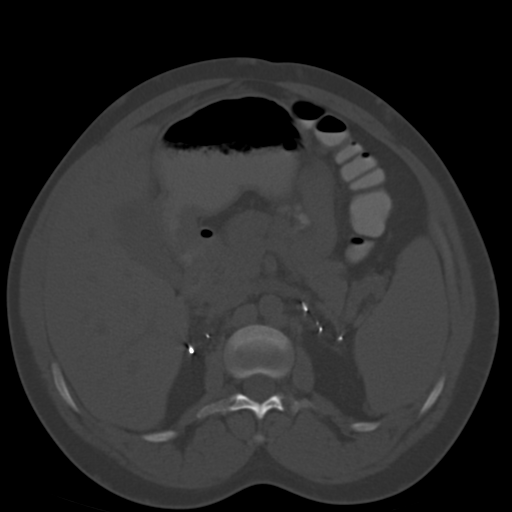
[im 63/81  soft-tissue]
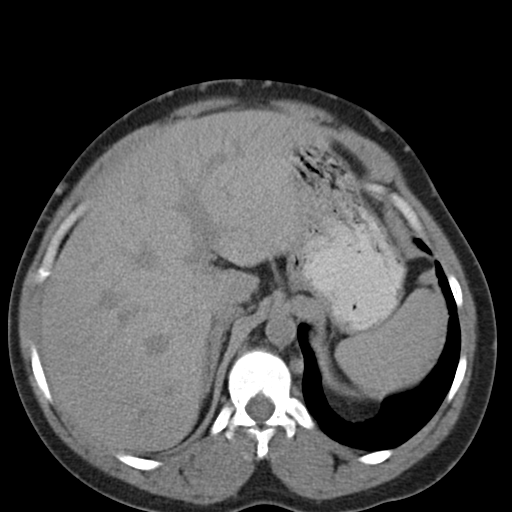
[im 67/81  lung]
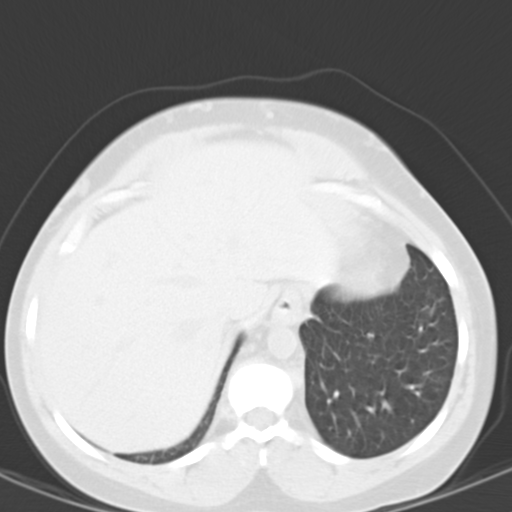
[im 70/81  soft-tissue]
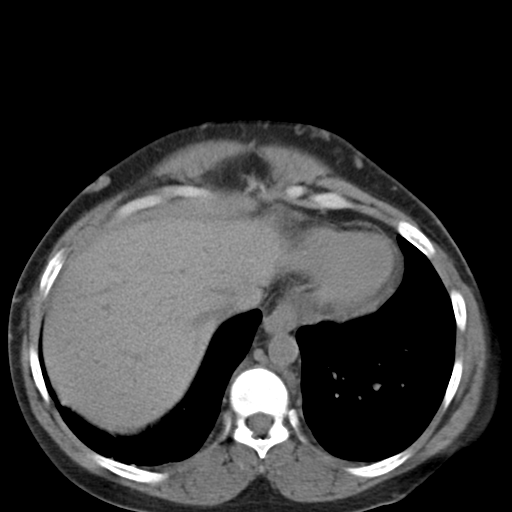
[im 70/81  lung]
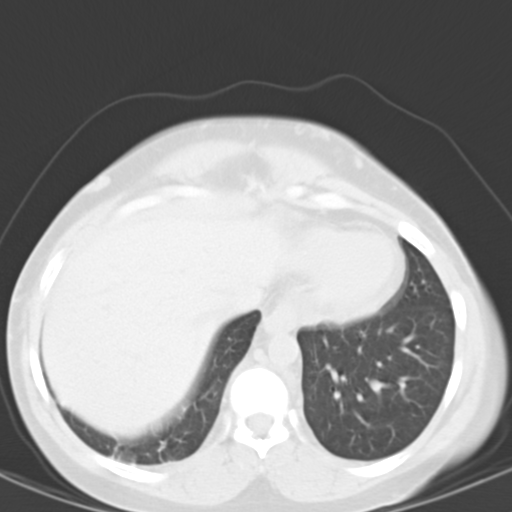
[im 74/81  lung]
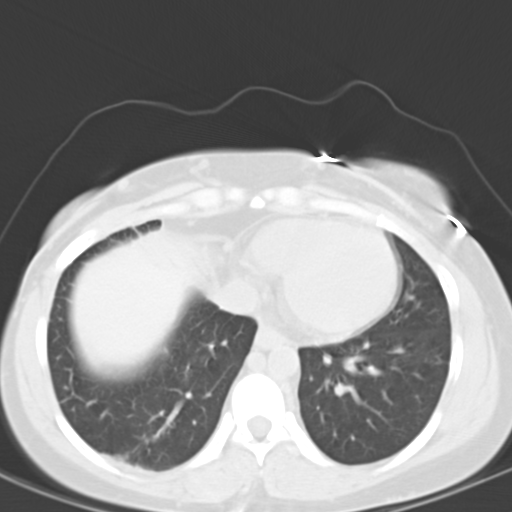
[im 77/81  soft-tissue]
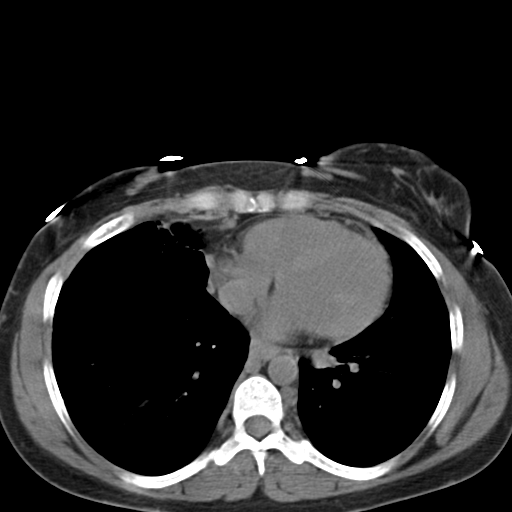
[im 77/81  lung]
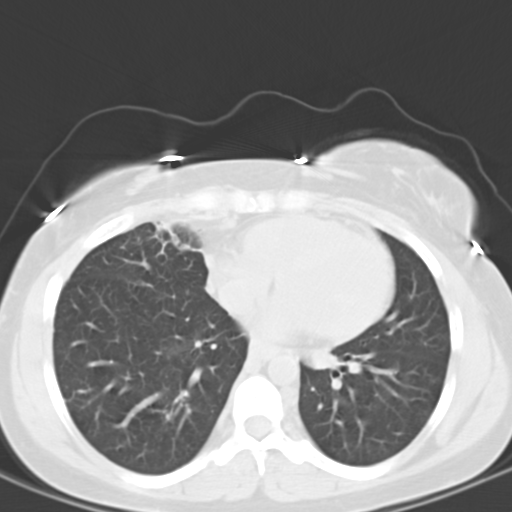

[14 of 32 positions shown; findings below may reference images not displayed]

CT pelvis 08/21/2002 [REDACTED].

CT ABDOMEN:

Within the limits of the unenhanced technique, the liver, spleen, and pancreas
are unremarkable. The gallbladder is unremarkable by CT and there is no biliary
ductal dilation. Both native kidneys are surgically absent. The adrenal glands
are unremarkable. There are scattered enlarged lymph nodes in the
retroperitoneum, the largest a left periaortic noted measuring approximately
x 1.4 cm; this node has increased in size since the prior CT. A small hiatal
hernia is present. The visualized colon and small bowel are unremarkable in the
upper abdomen. There is no free fluid. Visualized lung bases demonstrate
lingular scar. Prominent veins are noted in the subcutaneous tissues of the
anterior abdominal wall.
IMPRESSION: 1. No acute abnormalities in the abdomen.

2. Shoddy retroperitoneal lymphadenopathy. This is nonspecific and the nodes are
likely reactive.

3. Small hiatal hernia.

4. Status post bilateral nephrectomy.

CT PELVIS:

The transplant kidney is noted in the right upper pelvis. There may be mild
distention of the collecting system. The previously described complex fluid
collection anteriorly in the low pelvis has actually decreased in size since the
previous examination; it measures approximately 4.3 x 3.5 cm today (5.5 x 5.1 cm
previously). However, there are new fluid collections, one more superiorly in
the upper pelvis which measures approximately 7.4 x 3.8 cm and a second
dependently in the pelvis measuring approximate 6.1 x 2.5 cm. The dependent
collection appears to have a well-defined wall. The uterus is unremarkable for
age and unchanged in appearance. Numerous phleboliths are present. The colon and
small bowel are unremarkable.
IMPRESSION: 1. Decrease in size of the anterior complex fluid collection in the low pelvis
since 08/21/2002. Measurements are given above.

2. 2 new fluid collections in the pelvis as described. The collection
dependently in the posterior pelvis appears to have a well-defined wall.

3. Renal transplant in the right upper pelvis. There may be mild dilation of the
collecting system.

## 2006-06-09 ENCOUNTER — Encounter: Admission: RE | Admit: 2006-06-09 | Discharge: 2006-06-09 | Payer: Self-pay | Admitting: Nephrology

## 2006-07-27 IMAGING — US US PELVIS COMPLETE
1 series · 14 of 25 positions shown · non-contrast
Comparison: none

CLINICAL DATA: History of renal transplant and bilateral nephrectomy.  Previous CT revealed a fluid collection adjacent to the pelvic transplant.  
 PELVIC ULTRASOUND:
 A transplant kidney is noted in the right hemipelvis and noted in the mid to upper aspect of the pelvis and lower abdomen on the 03/18/04 CT scan.  The transplant measures approximately 11.6 cm in length.  No hydronephrosis or renal mass.  Negative for renal parenchymal abnormality.  Imaging was performed using the bladder as an ultrasonic window.  There is a fluid collection in the midline just inferior to the uterus and bladder.  This collection measures approximately 5.6 x 8.5 x 9.8 cm, slightly larger than noted in the 03/18/04 CT scan.  Internal septations and echoes are noted.  The previously noted collection more inferiorly in the pelvis and cul-de-sac region is not appreciated on today?s examination.

[Series 1: unknown · 0.25mm/px · 14 of 42 slices shown]
[im 1/42]
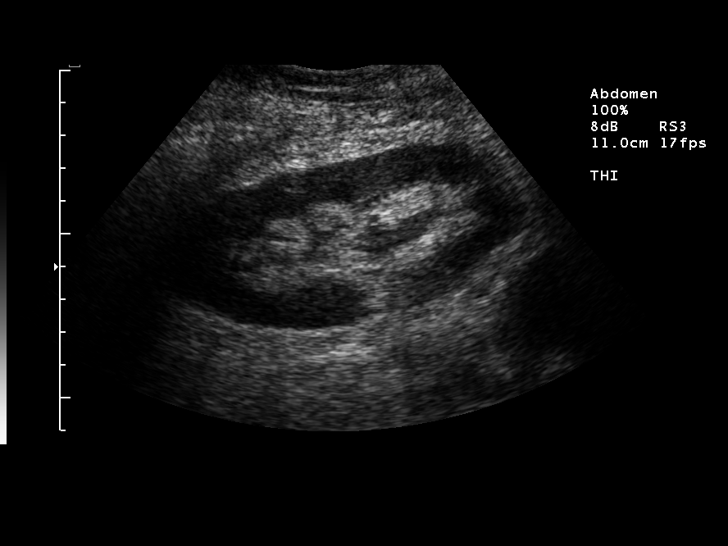
[im 4/42]
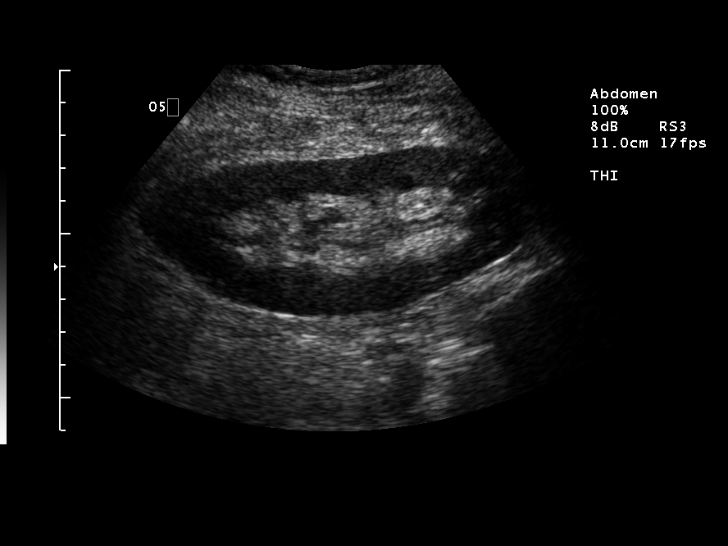
[im 7/42]
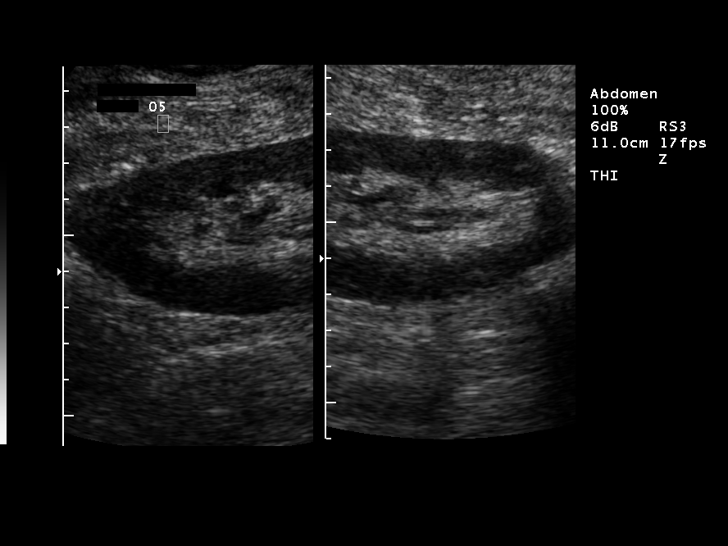
[im 11/42]
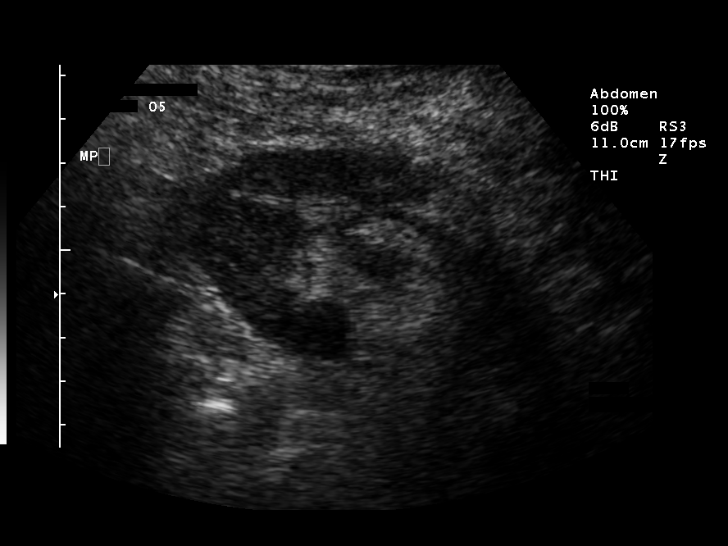
[im 14/42]
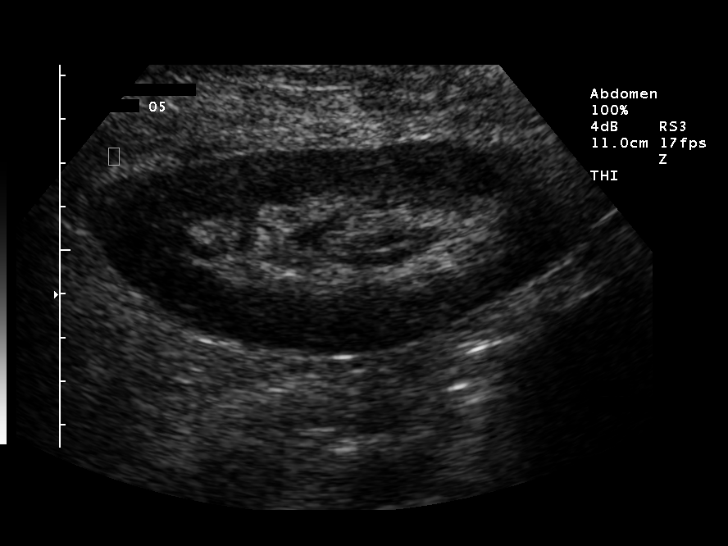
[im 16/42]
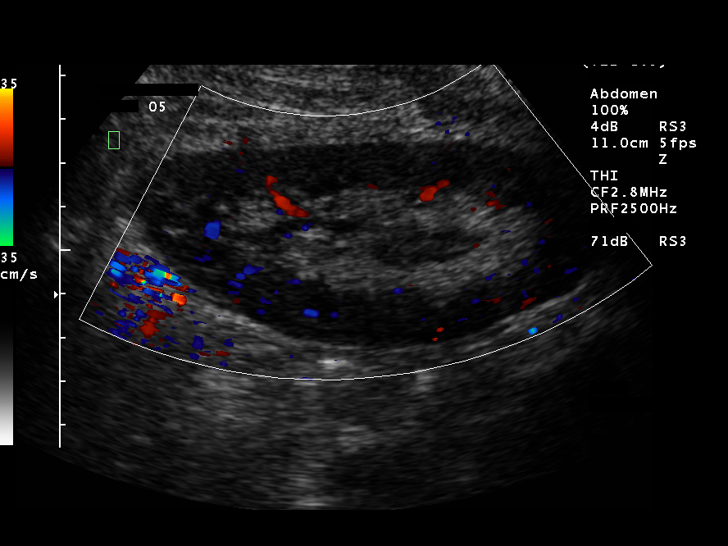
[im 19/42]
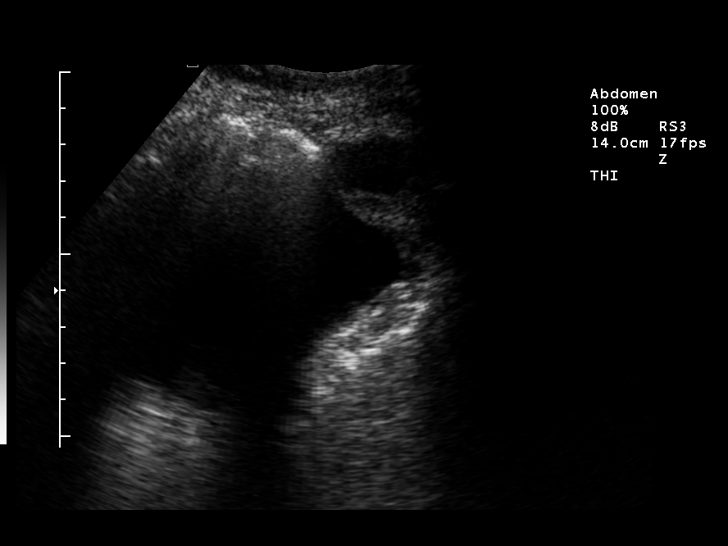
[im 23/42]
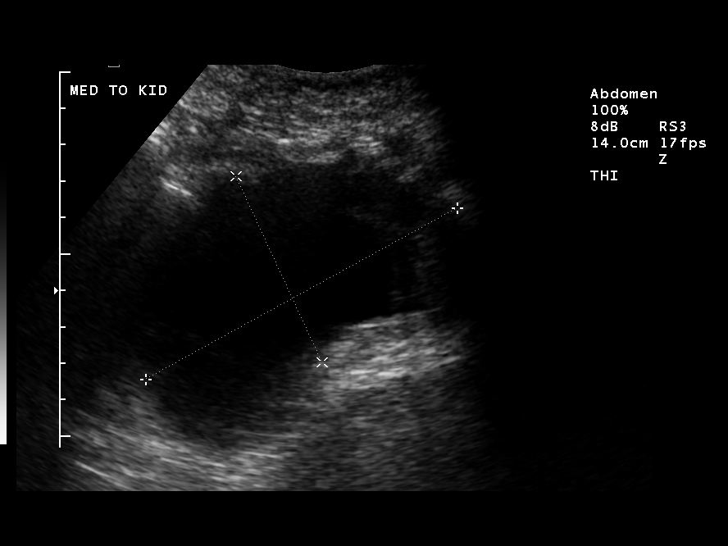
[im 26/42]
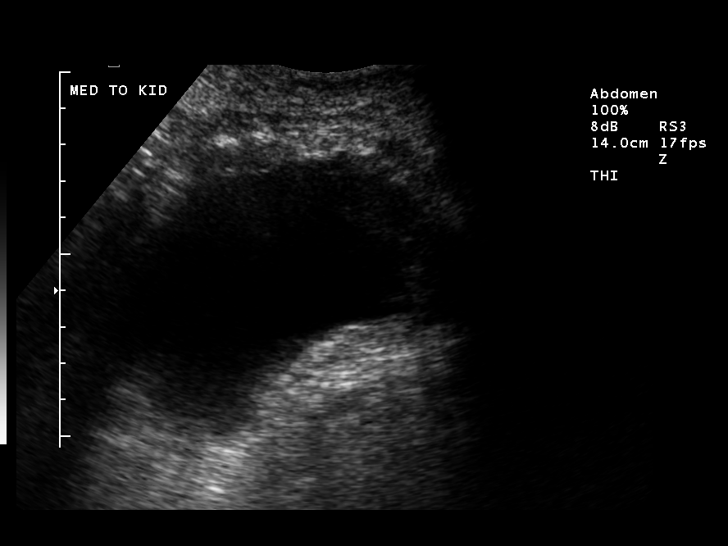
[im 28/42]
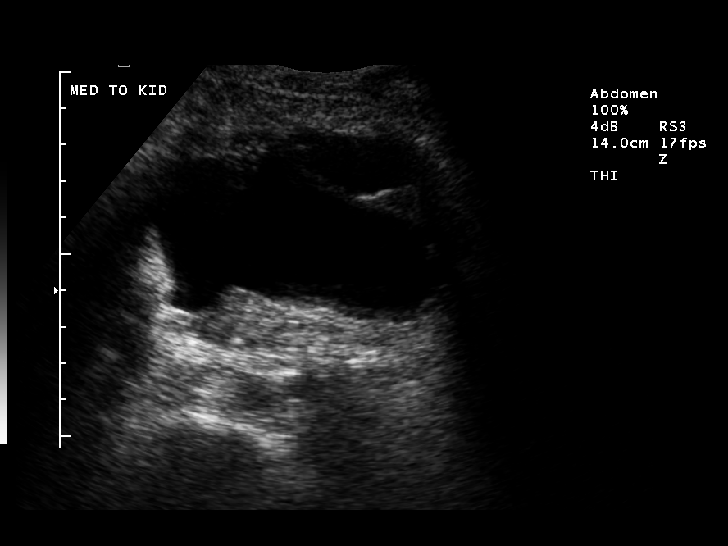
[im 31/42]
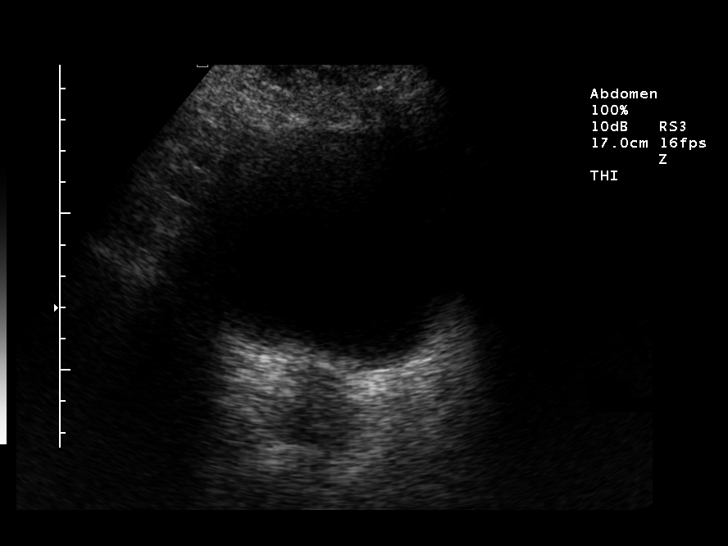
[im 35/42]
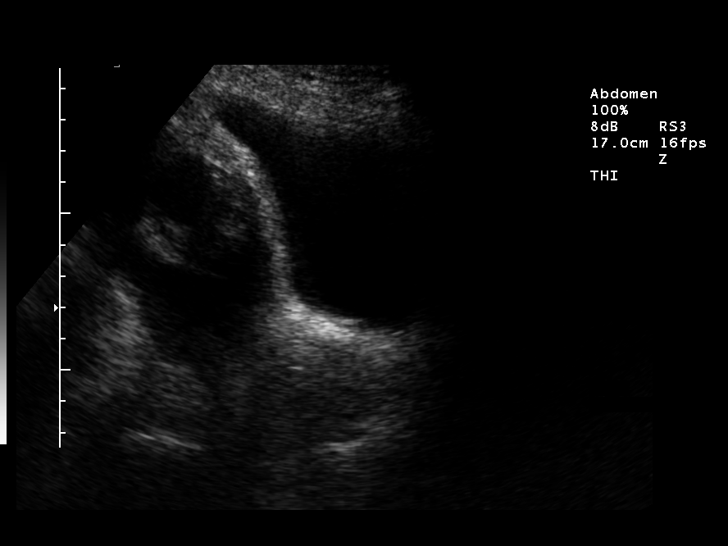
[im 38/42]
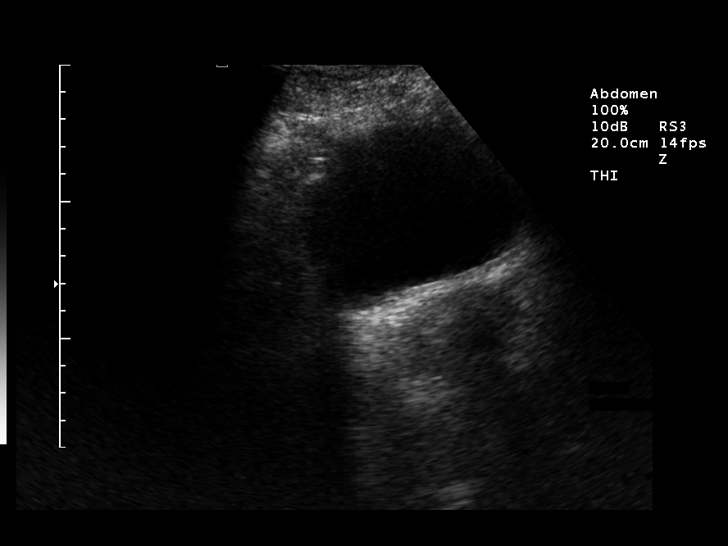
[im 42/42]
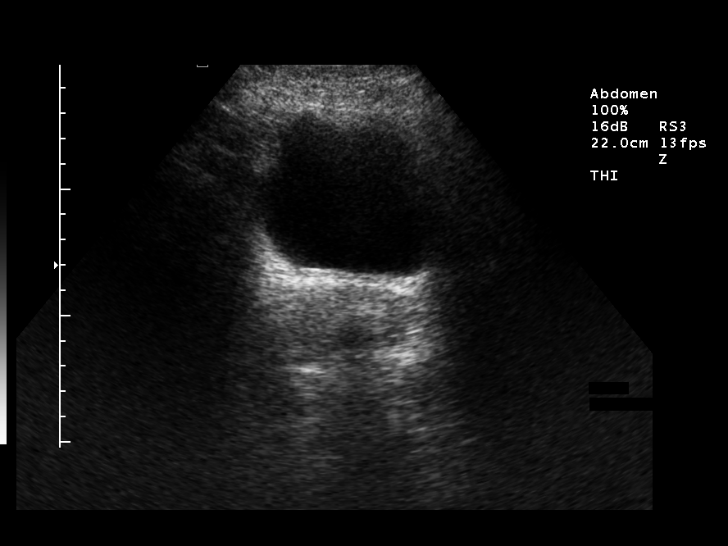

[14 of 25 positions shown; findings below may reference images not displayed]

IMPRESSION: Stable appearance of the right pelvic transplant.
 Slight increase in size of the pelvic fluid collection of indeterminate etiology.  Major considerations include chronic hematoma/seroma, abscess, and potentially lymphocele.

## 2006-08-12 ENCOUNTER — Encounter (HOSPITAL_COMMUNITY): Admission: RE | Admit: 2006-08-12 | Discharge: 2006-11-10 | Payer: Self-pay | Admitting: Nephrology

## 2006-11-04 ENCOUNTER — Emergency Department (HOSPITAL_COMMUNITY): Admission: EM | Admit: 2006-11-04 | Discharge: 2006-11-04 | Payer: Self-pay | Admitting: Emergency Medicine

## 2006-11-22 ENCOUNTER — Ambulatory Visit (HOSPITAL_COMMUNITY): Admission: RE | Admit: 2006-11-22 | Discharge: 2006-11-22 | Payer: Self-pay | Admitting: Obstetrics and Gynecology

## 2006-12-01 ENCOUNTER — Ambulatory Visit: Admission: RE | Admit: 2006-12-01 | Discharge: 2006-12-01 | Payer: Self-pay | Admitting: Gynecologic Oncology

## 2006-12-02 ENCOUNTER — Encounter (HOSPITAL_COMMUNITY): Admission: RE | Admit: 2006-12-02 | Discharge: 2007-03-02 | Payer: Self-pay | Admitting: Nephrology

## 2007-01-01 IMAGING — CR DG LUMBAR SPINE COMPLETE 4+V
5 series · 5 of 5 positions shown · non-contrast
Comparison: none

CLINICAL DATA: Mid to low back pain.  History of renal transplant. 
 LUMBAR SPINE ? 5 VIEW:

[t l-spine a.p.]
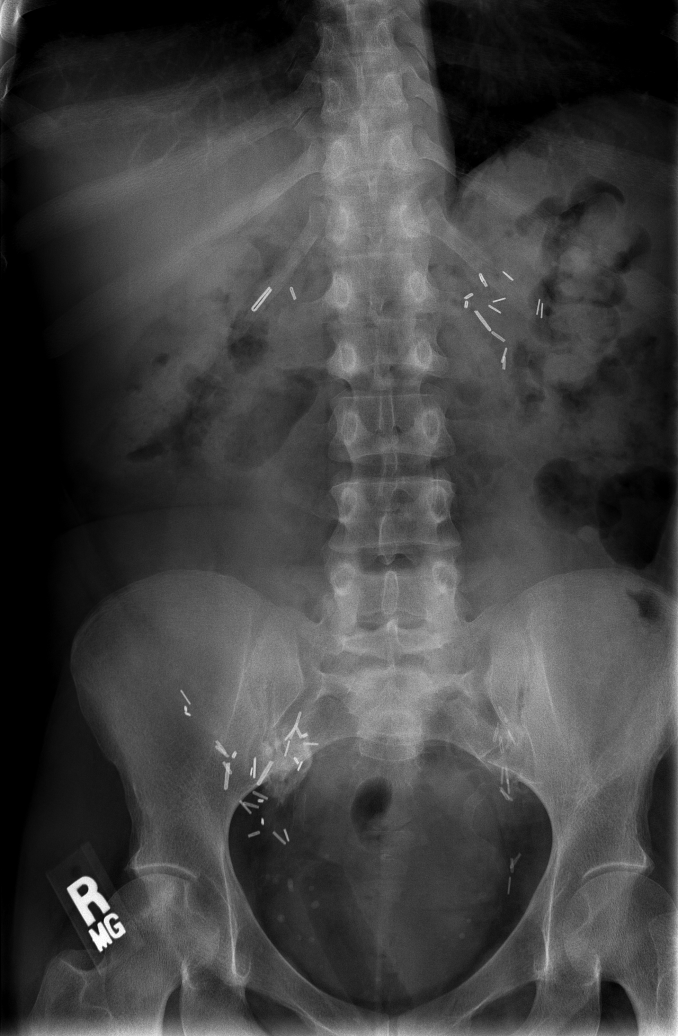

[t l-spine oblique exposure (1 of 2)]
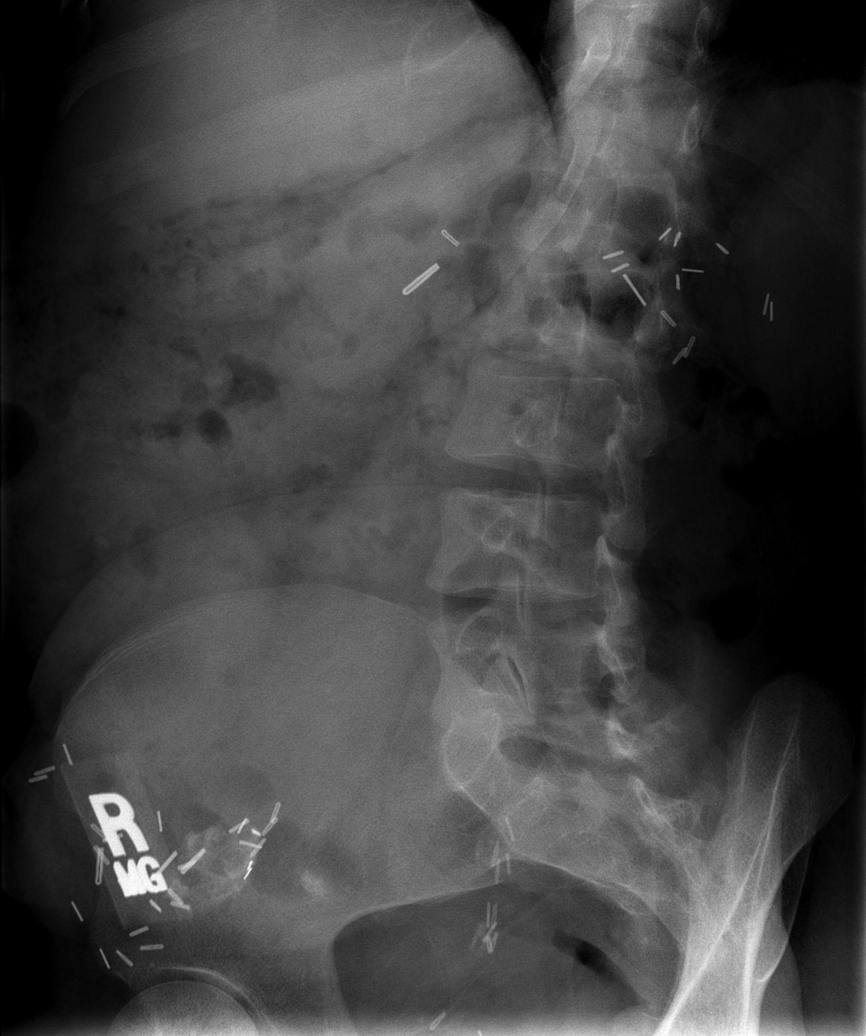

[t l-spine oblique exposure (2 of 2)]
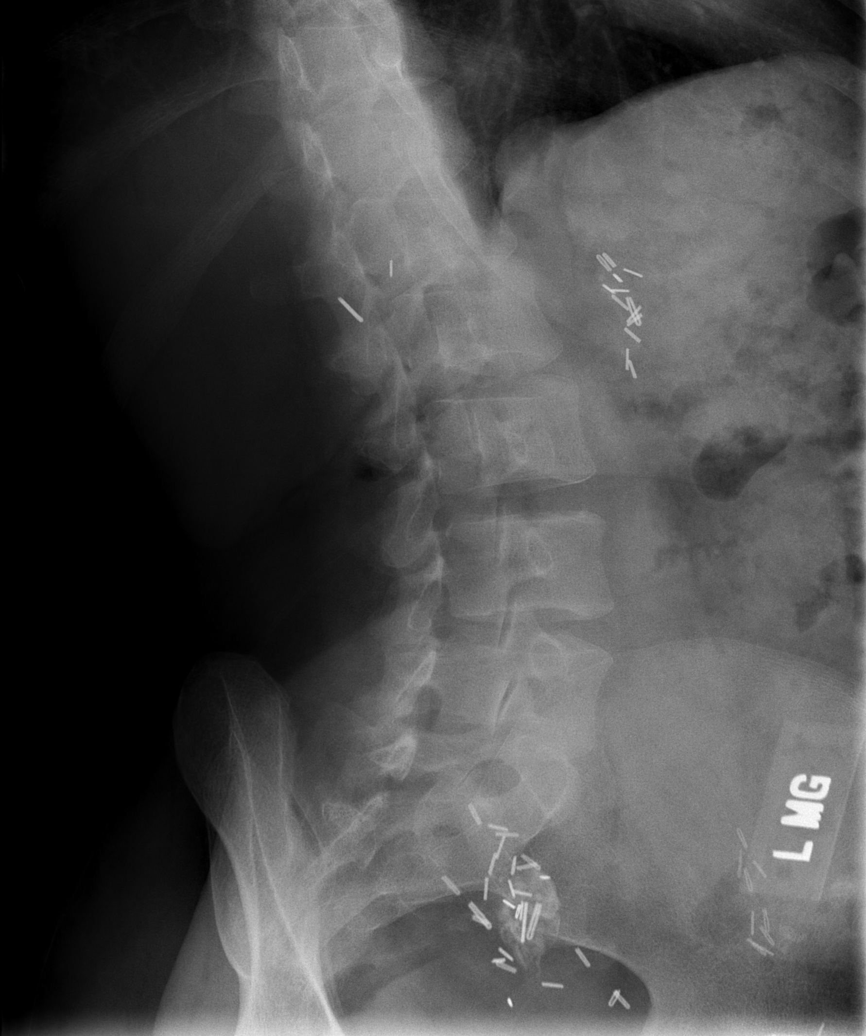

[t l-spine lat]
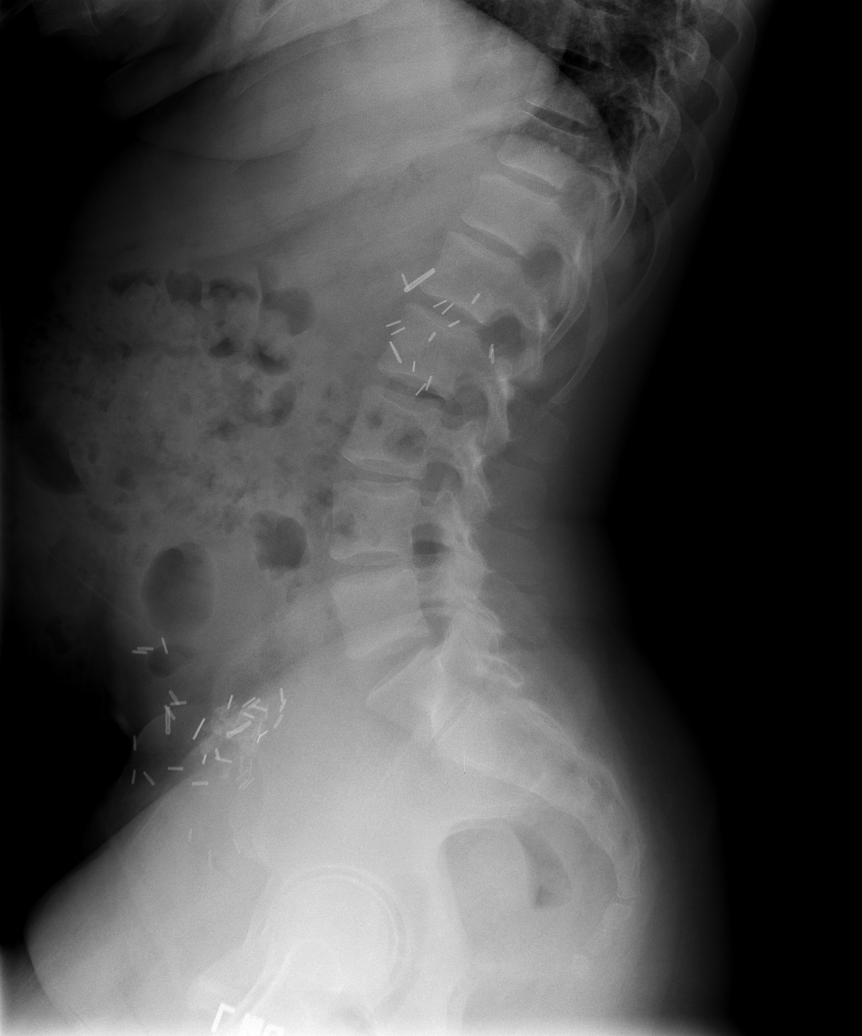

[t l-spine l5-s1 spot]
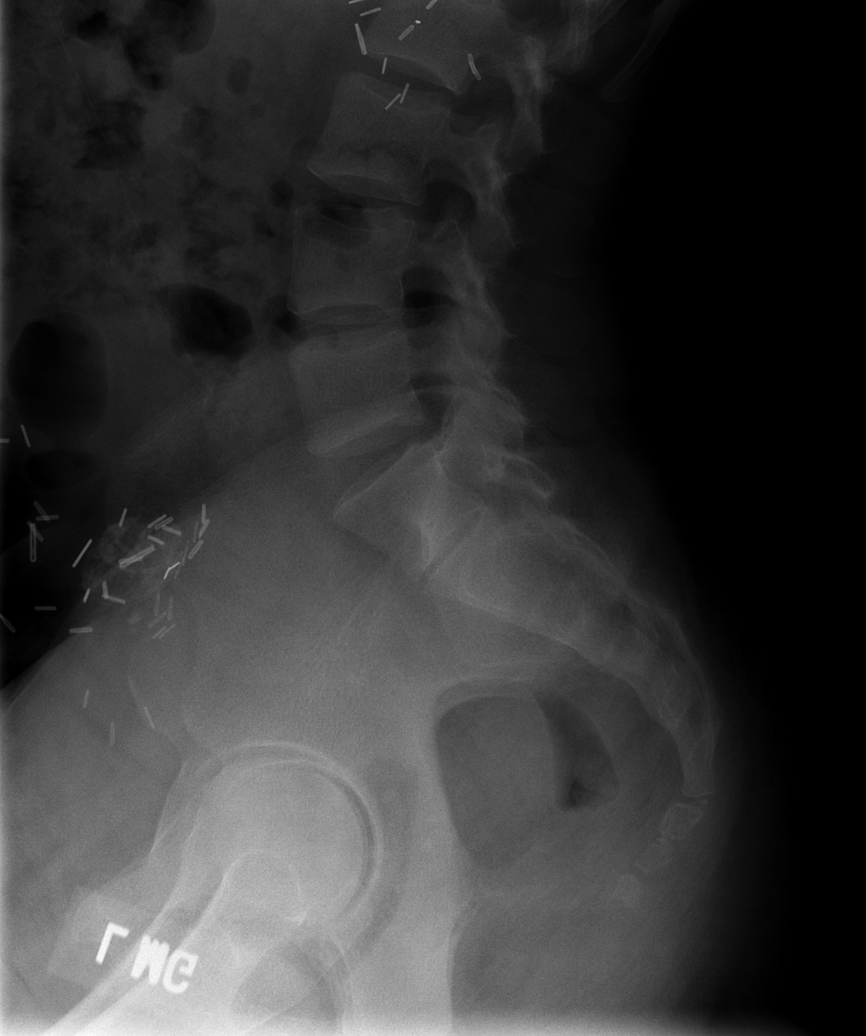

[5 of 5 positions shown; findings below may reference images not displayed]

FINDINGS: Five views of the lumbar spine were obtained.  The lumbar vertebrae are in normal alignment with normal intervertebral disc spaces.  No compression deformity is seen.  Surgical clips are present in the iliac regions bilaterally right greater than left in this patient who has a right pelvic transplanted kidney.  Adjacent to the collection of clips in the right pelvis there is an area of higher density.  A CT of 03/18/04 and 08/21/02 were reviewed, and this area was present on both of those studies and probably is postoperative in nature.  The bowel gas pattern is nonspecific.
IMPRESSION: Normal alignment of the lumbar vertebrae with normal disc spaces.  Postop changes as noted above.

## 2007-03-25 ENCOUNTER — Encounter (HOSPITAL_COMMUNITY): Admission: RE | Admit: 2007-03-25 | Discharge: 2007-06-23 | Payer: Self-pay | Admitting: Nephrology

## 2007-06-08 IMAGING — CR DG CHEST 2V
2 series · 2 of 2 positions shown · non-contrast
Comparison: CT of 10/24/04 and plain film of 03/14/04.

CLINICAL DATA: Shortness of breath for three days.  Low-grade fever, cough, and congestion.  Status post kidney transplant.
 CHEST - 2 VIEW:

[w chest pa]
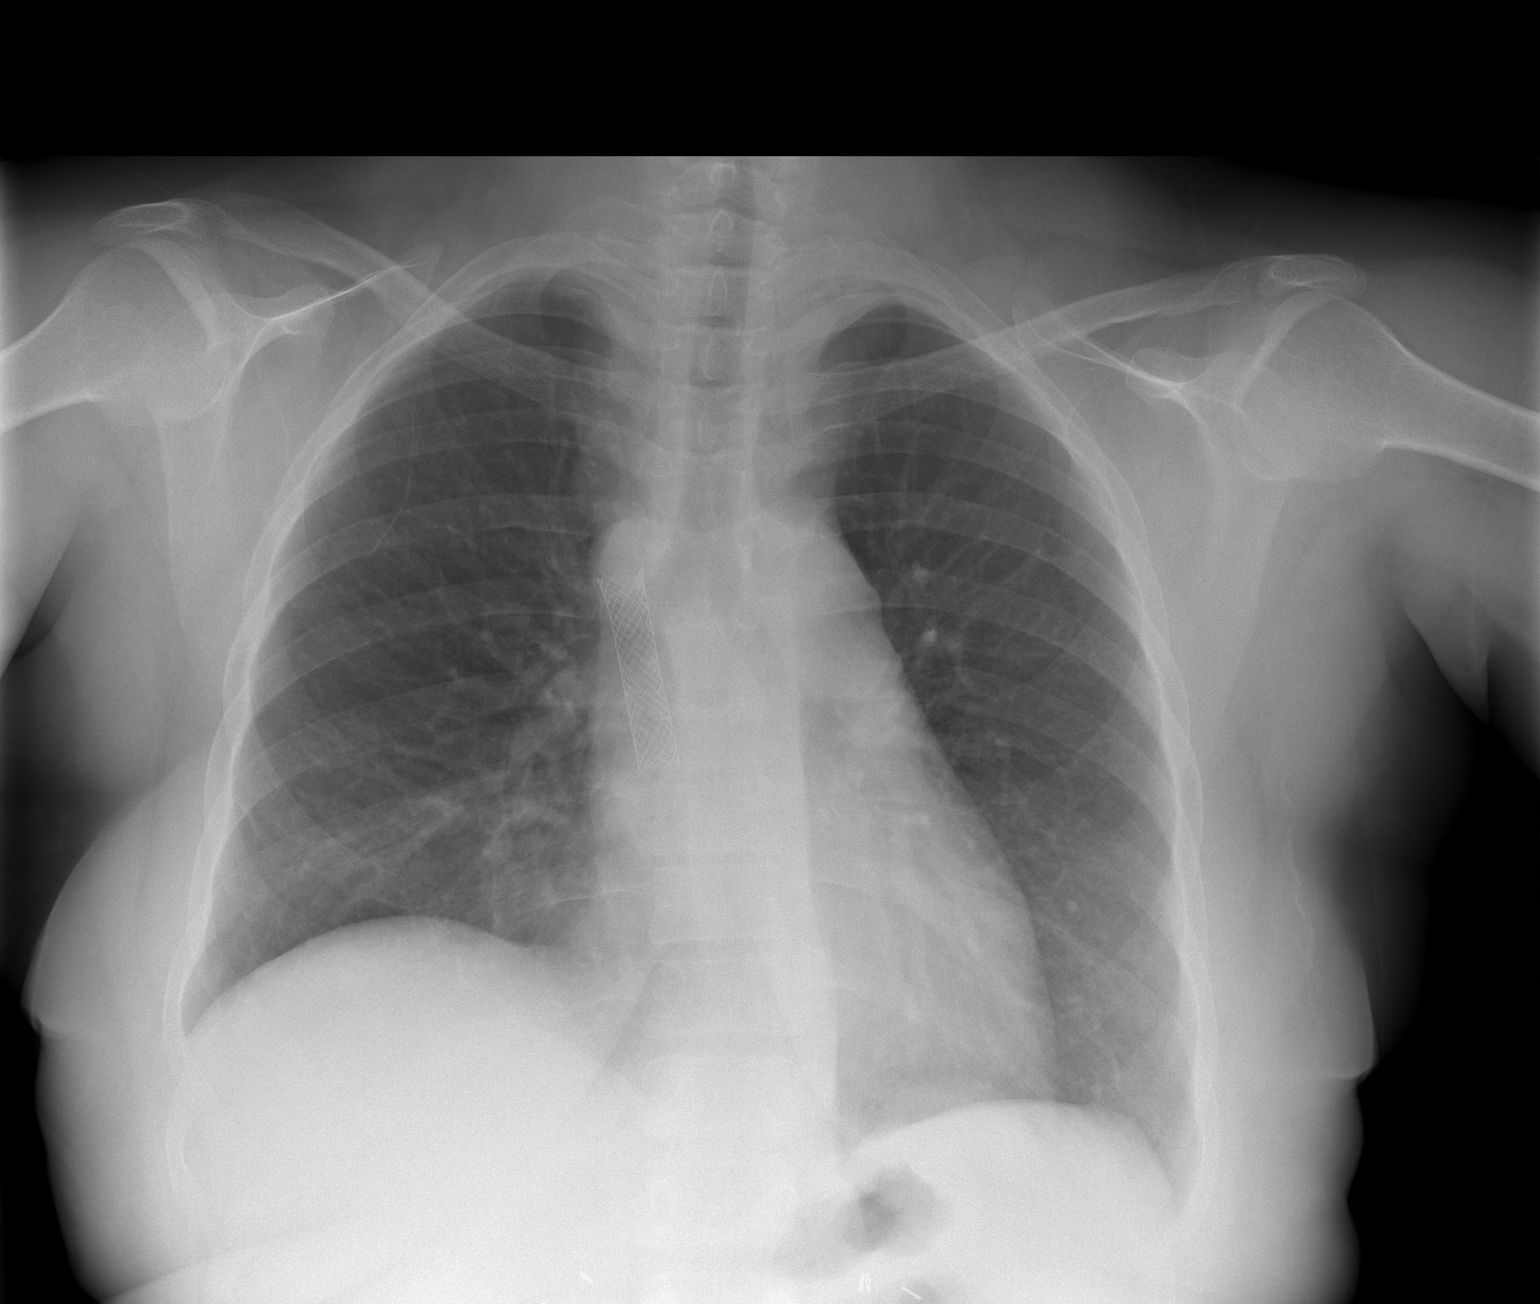

[w chest lat]
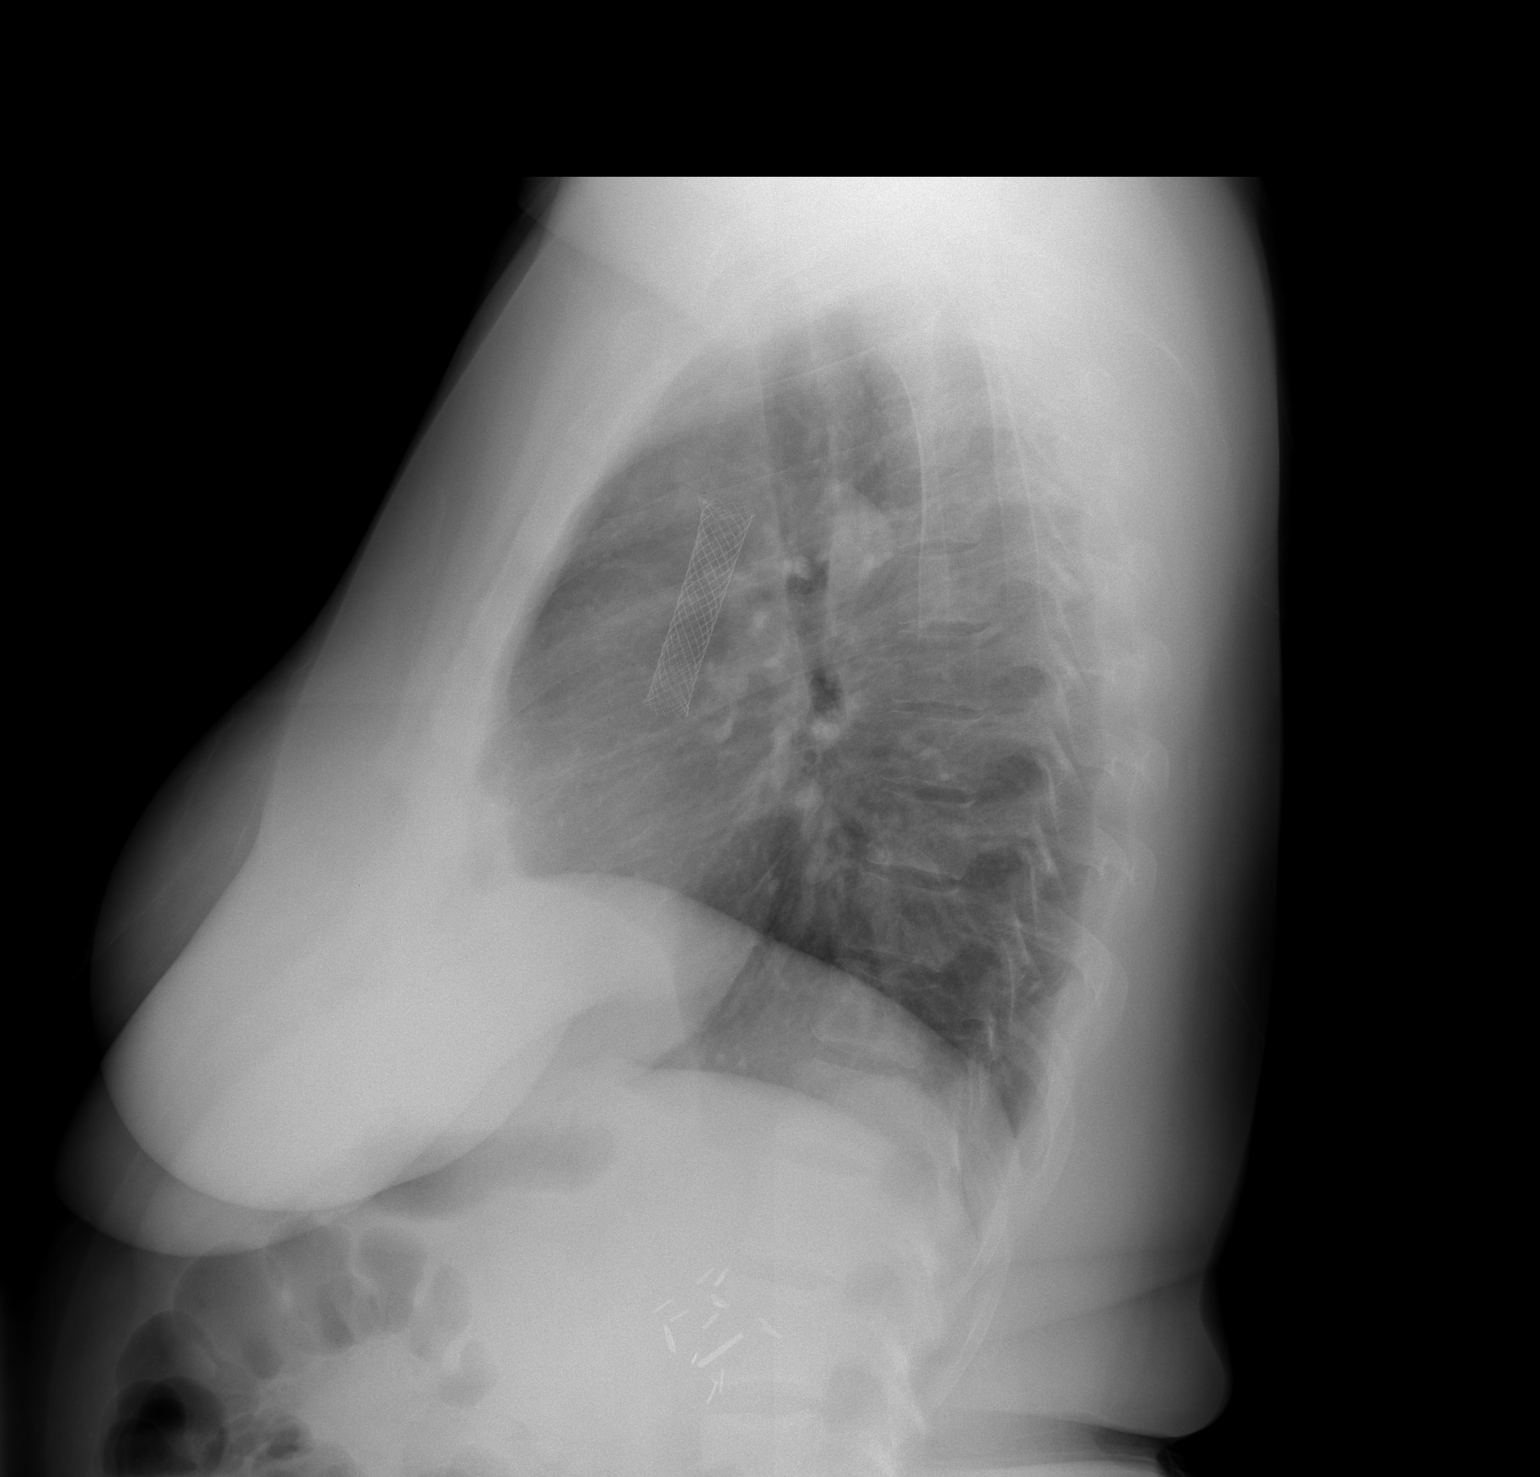

[2 of 2 positions shown; findings below may reference images not displayed]

FINDINGS: Midline trachea.   The heart size is normal.   There is an SVC stent which is unchanged in position.  Lung volumes are mildly diminished.  The lungs are clear.   No pleural fluid.  No congestive failure.  Osseous structures are intact.
IMPRESSION: No acute cardiopulmonary disease.

## 2007-07-07 ENCOUNTER — Encounter (HOSPITAL_COMMUNITY): Admission: RE | Admit: 2007-07-07 | Discharge: 2007-10-05 | Payer: Self-pay | Admitting: Nephrology

## 2007-07-29 ENCOUNTER — Encounter: Admission: RE | Admit: 2007-07-29 | Discharge: 2007-07-29 | Payer: Self-pay | Admitting: Nephrology

## 2007-08-11 ENCOUNTER — Encounter: Admission: RE | Admit: 2007-08-11 | Discharge: 2007-08-11 | Payer: Self-pay | Admitting: Nephrology

## 2007-08-24 ENCOUNTER — Ambulatory Visit: Payer: Self-pay | Admitting: Gastroenterology

## 2007-08-24 DIAGNOSIS — R634 Abnormal weight loss: Secondary | ICD-10-CM | POA: Insufficient documentation

## 2007-08-24 DIAGNOSIS — R112 Nausea with vomiting, unspecified: Secondary | ICD-10-CM | POA: Insufficient documentation

## 2007-08-25 LAB — CONVERTED CEMR LAB
ALT: 15 units/L (ref 0–35)
AST: 18 units/L (ref 0–37)
Albumin: 3.9 g/dL (ref 3.5–5.2)
Alkaline Phosphatase: 102 units/L (ref 39–117)
BUN: 17 mg/dL (ref 6–23)
Basophils Absolute: 0 10*3/uL (ref 0.0–0.1)
Basophils Relative: 0 % (ref 0.0–3.0)
CO2: 28 meq/L (ref 19–32)
Calcium: 9.6 mg/dL (ref 8.4–10.5)
Chloride: 107 meq/L (ref 96–112)
Creatinine, Ser: 1.5 mg/dL — ABNORMAL HIGH (ref 0.4–1.2)
Eosinophils Absolute: 0.1 10*3/uL (ref 0.0–0.7)
Eosinophils Relative: 1.4 % (ref 0.0–5.0)
GFR calc Af Amer: 50 mL/min
GFR calc non Af Amer: 41 mL/min
Glucose, Bld: 83 mg/dL (ref 70–99)
HCT: 33.1 % — ABNORMAL LOW (ref 36.0–46.0)
Hemoglobin: 11 g/dL — ABNORMAL LOW (ref 12.0–15.0)
Lymphocytes Relative: 15.6 % (ref 12.0–46.0)
MCHC: 33.3 g/dL (ref 30.0–36.0)
MCV: 74.8 fL — ABNORMAL LOW (ref 78.0–100.0)
Monocytes Absolute: 0.5 10*3/uL (ref 0.1–1.0)
Monocytes Relative: 6.8 % (ref 3.0–12.0)
Neutro Abs: 5.3 10*3/uL (ref 1.4–7.7)
Neutrophils Relative %: 76.2 % (ref 43.0–77.0)
Platelets: 185 10*3/uL (ref 150–400)
Potassium: 4.3 meq/L (ref 3.5–5.1)
RBC: 4.42 M/uL (ref 3.87–5.11)
RDW: 15.8 % — ABNORMAL HIGH (ref 11.5–14.6)
Sodium: 144 meq/L (ref 135–145)
Total Bilirubin: 0.8 mg/dL (ref 0.3–1.2)
Total Protein: 6.3 g/dL (ref 6.0–8.3)
WBC: 7 10*3/uL (ref 4.5–10.5)

## 2007-08-26 ENCOUNTER — Ambulatory Visit: Payer: Self-pay | Admitting: Gastroenterology

## 2007-08-29 ENCOUNTER — Ambulatory Visit: Payer: Self-pay | Admitting: Gastroenterology

## 2007-08-29 LAB — CONVERTED CEMR LAB: hCG, Beta Chain, Quant, S: 7.45 milliintl units/mL

## 2007-08-30 ENCOUNTER — Telehealth: Payer: Self-pay | Admitting: Gastroenterology

## 2007-08-30 LAB — CONVERTED CEMR LAB: hCG, Beta Chain, Quant, S: 7.56 milliintl units/mL

## 2007-09-01 ENCOUNTER — Telehealth: Payer: Self-pay | Admitting: Gastroenterology

## 2007-09-01 ENCOUNTER — Encounter: Payer: Self-pay | Admitting: Gastroenterology

## 2007-09-01 ENCOUNTER — Ambulatory Visit (HOSPITAL_COMMUNITY): Admission: RE | Admit: 2007-09-01 | Discharge: 2007-09-01 | Payer: Self-pay | Admitting: Gastroenterology

## 2007-09-02 ENCOUNTER — Telehealth: Payer: Self-pay | Admitting: Gastroenterology

## 2007-09-16 ENCOUNTER — Ambulatory Visit: Payer: Self-pay | Admitting: Gastroenterology

## 2007-09-20 LAB — CONVERTED CEMR LAB: hCG, Beta Chain, Quant, S: 8.36 milliintl units/mL

## 2007-09-29 ENCOUNTER — Ambulatory Visit (HOSPITAL_COMMUNITY): Admission: RE | Admit: 2007-09-29 | Discharge: 2007-09-29 | Payer: Self-pay | Admitting: Gastroenterology

## 2007-09-29 ENCOUNTER — Ambulatory Visit: Payer: Self-pay | Admitting: Gastroenterology

## 2007-09-30 ENCOUNTER — Telehealth: Payer: Self-pay | Admitting: Gastroenterology

## 2007-09-30 IMAGING — CR DG CHEST 2V
2 series · 2 of 2 positions shown · non-contrast
Comparison: 03/21/05.

CLINICAL DATA: Chest pain. 
 CHEST - 2 VIEWS ? 07/13/05 AT 1195 HOURS:

[w chest pa]
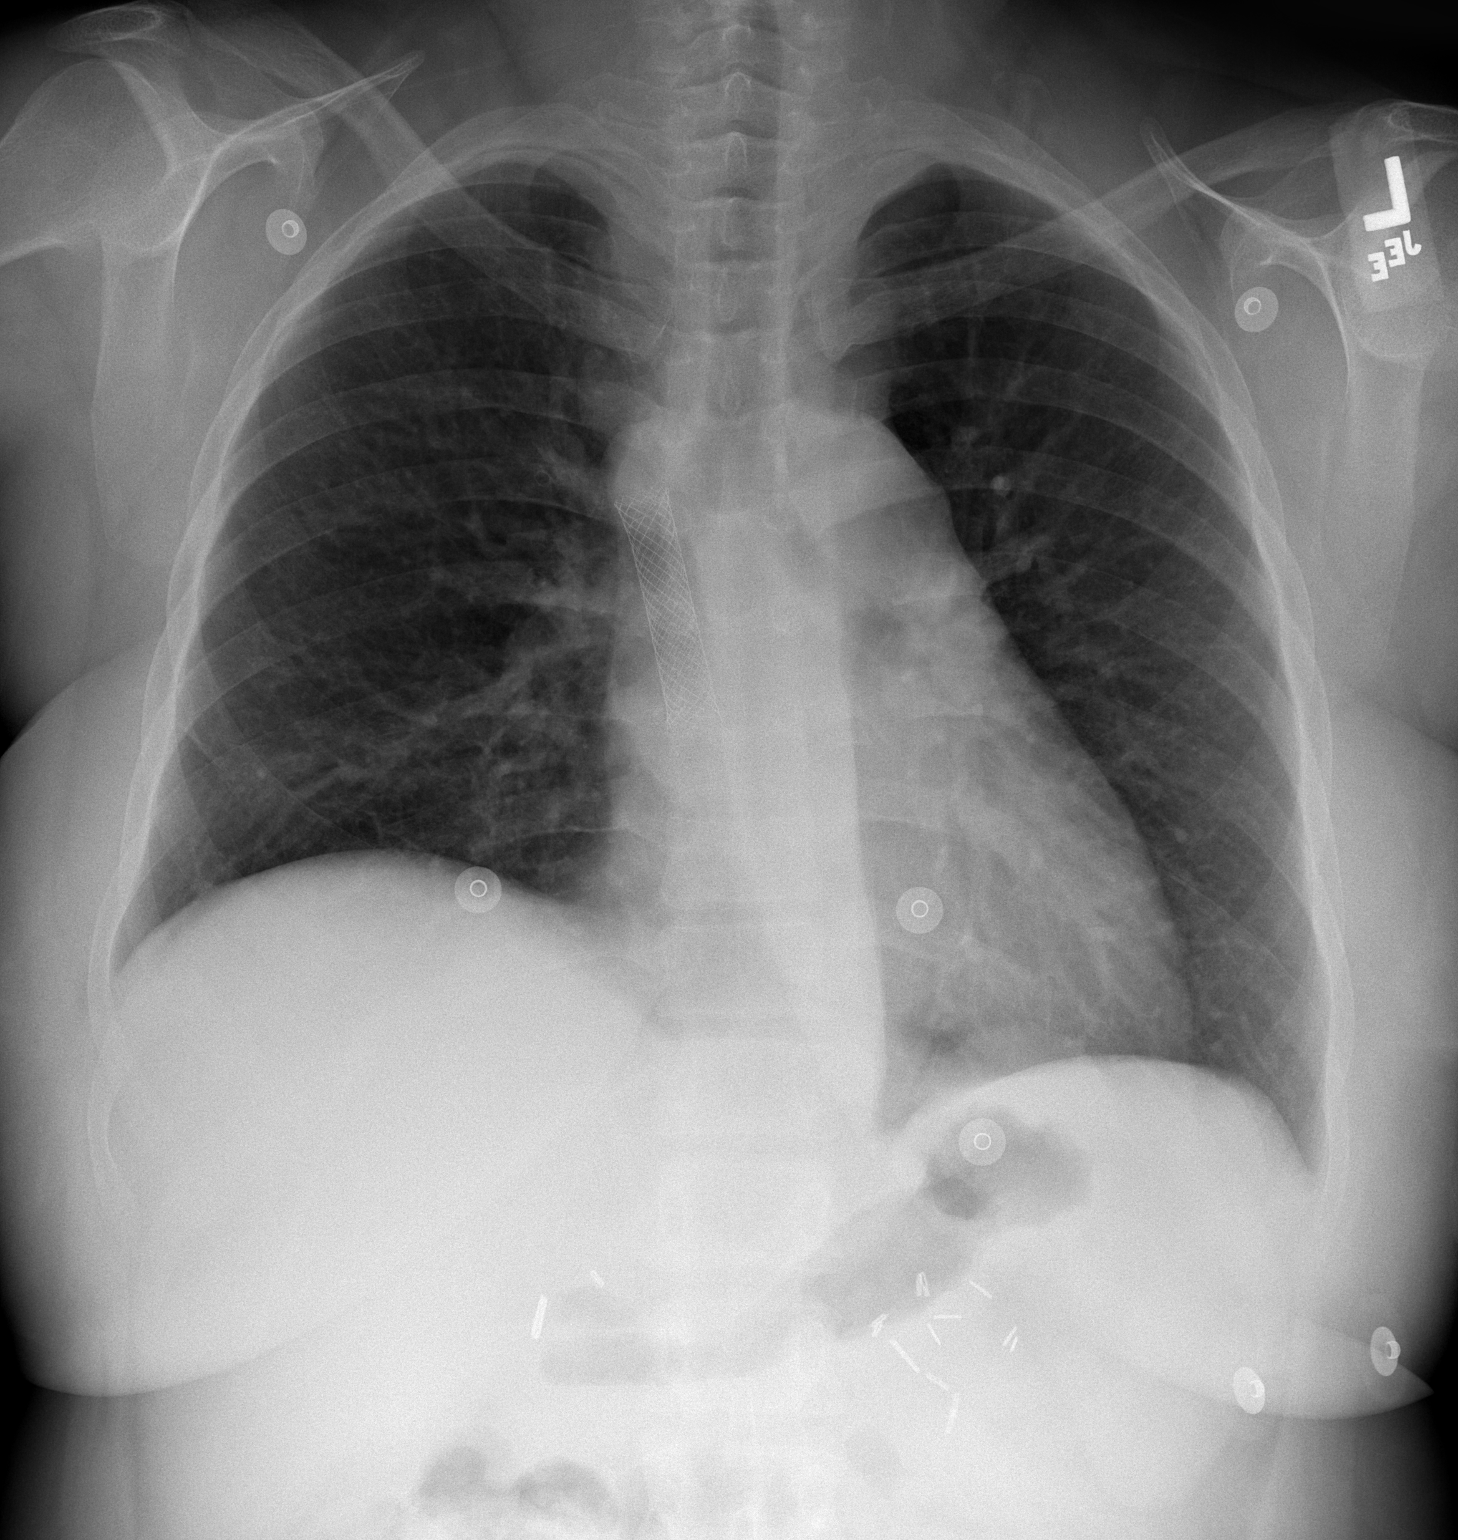

[w chest lat *]
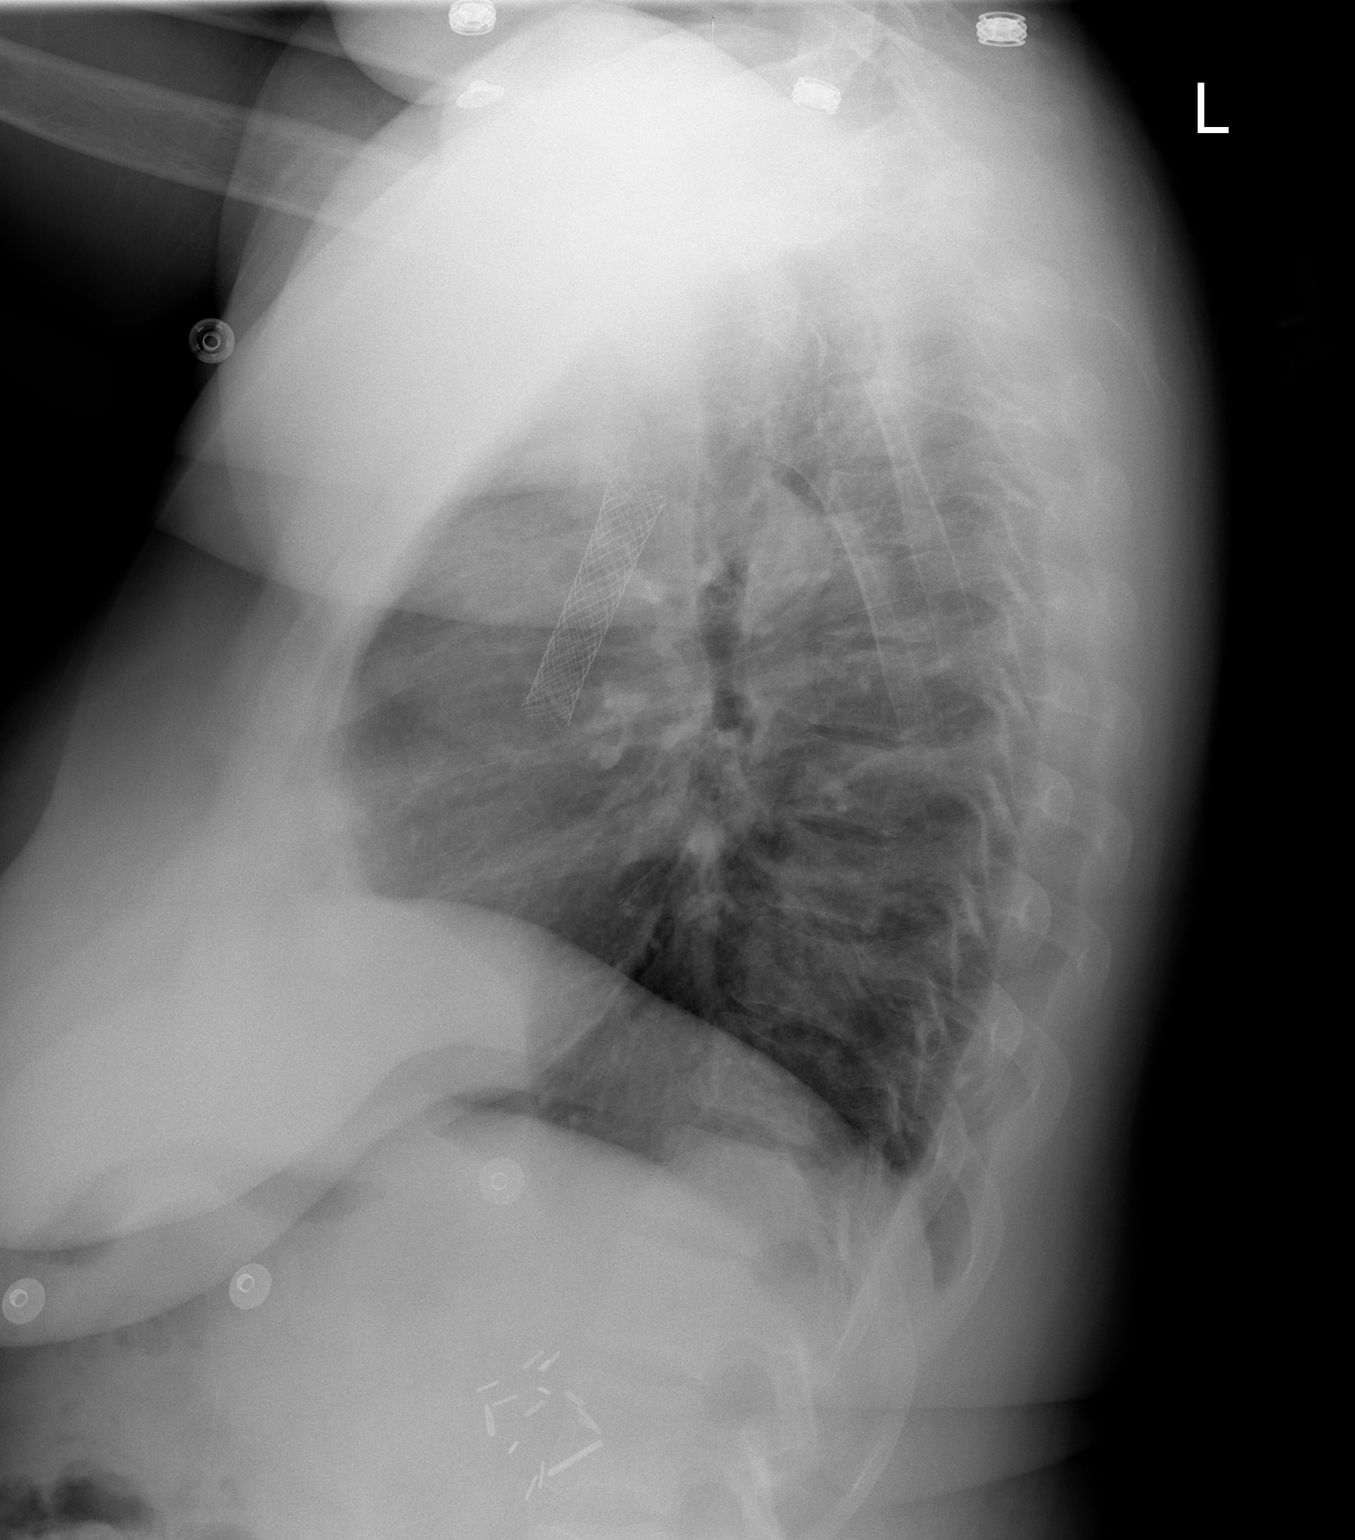

[2 of 2 positions shown; findings below may reference images not displayed]

FINDINGS: The heart is normal in size.  The superior vena cava stent is stable.  Pulmonary vascularity is within normal limits.  The lungs are clear.  No pneumothoraces or effusions are seen.
IMPRESSION: No acute cardiopulmonary disease.

## 2007-10-07 ENCOUNTER — Encounter (HOSPITAL_COMMUNITY): Admission: RE | Admit: 2007-10-07 | Discharge: 2008-01-05 | Payer: Self-pay | Admitting: Nephrology

## 2007-11-14 ENCOUNTER — Encounter: Payer: Self-pay | Admitting: Gastroenterology

## 2008-01-07 ENCOUNTER — Emergency Department (HOSPITAL_COMMUNITY): Admission: EM | Admit: 2008-01-07 | Discharge: 2008-01-08 | Payer: Self-pay | Admitting: Emergency Medicine

## 2008-01-12 ENCOUNTER — Encounter (HOSPITAL_COMMUNITY): Admission: RE | Admit: 2008-01-12 | Discharge: 2008-02-02 | Payer: Self-pay | Admitting: Nephrology

## 2008-02-03 ENCOUNTER — Encounter (HOSPITAL_COMMUNITY): Admission: RE | Admit: 2008-02-03 | Discharge: 2008-05-03 | Payer: Self-pay | Admitting: Nephrology

## 2008-02-17 ENCOUNTER — Encounter: Payer: Self-pay | Admitting: Gastroenterology

## 2008-03-12 ENCOUNTER — Emergency Department (HOSPITAL_COMMUNITY): Admission: EM | Admit: 2008-03-12 | Discharge: 2008-03-12 | Payer: Self-pay | Admitting: Emergency Medicine

## 2008-03-21 ENCOUNTER — Ambulatory Visit: Payer: Self-pay | Admitting: Gastroenterology

## 2008-03-22 LAB — CONVERTED CEMR LAB
ALT: 11 units/L (ref 0–35)
AST: 16 units/L (ref 0–37)
Albumin: 3.9 g/dL (ref 3.5–5.2)
Alkaline Phosphatase: 101 units/L (ref 39–117)
BUN: 25 mg/dL — ABNORMAL HIGH (ref 6–23)
Basophils Absolute: 0 10*3/uL (ref 0.0–0.1)
Basophils Relative: 0.1 % (ref 0.0–3.0)
CO2: 27 meq/L (ref 19–32)
Calcium: 9.6 mg/dL (ref 8.4–10.5)
Chloride: 103 meq/L (ref 96–112)
Creatinine, Ser: 1.9 mg/dL — ABNORMAL HIGH (ref 0.4–1.2)
Eosinophils Absolute: 0.1 10*3/uL (ref 0.0–0.7)
Eosinophils Relative: 1.6 % (ref 0.0–5.0)
GFR calc Af Amer: 38 mL/min
GFR calc non Af Amer: 31 mL/min
Glucose, Bld: 83 mg/dL (ref 70–99)
HCT: 28.8 % — ABNORMAL LOW (ref 36.0–46.0)
Hemoglobin: 10.3 g/dL — ABNORMAL LOW (ref 12.0–15.0)
Lymphocytes Relative: 14.1 % (ref 12.0–46.0)
MCHC: 35.6 g/dL (ref 30.0–36.0)
MCV: 84.2 fL (ref 78.0–100.0)
Monocytes Absolute: 0.2 10*3/uL (ref 0.1–1.0)
Monocytes Relative: 2.9 % — ABNORMAL LOW (ref 3.0–12.0)
Neutro Abs: 5.2 10*3/uL (ref 1.4–7.7)
Neutrophils Relative %: 81.3 % — ABNORMAL HIGH (ref 43.0–77.0)
Platelets: 206 10*3/uL (ref 150–400)
Potassium: 4.5 meq/L (ref 3.5–5.1)
RBC: 3.43 M/uL — ABNORMAL LOW (ref 3.87–5.11)
RDW: 21.2 % — ABNORMAL HIGH (ref 11.5–14.6)
Sed Rate: 29 mm/hr — ABNORMAL HIGH (ref 0–22)
Sodium: 139 meq/L (ref 135–145)
Total Bilirubin: 0.7 mg/dL (ref 0.3–1.2)
Total Protein: 6.5 g/dL (ref 6.0–8.3)
WBC: 6.4 10*3/uL (ref 4.5–10.5)

## 2008-03-28 ENCOUNTER — Ambulatory Visit: Admission: RE | Admit: 2008-03-28 | Discharge: 2008-03-28 | Payer: Self-pay | Admitting: Gastroenterology

## 2008-04-07 ENCOUNTER — Inpatient Hospital Stay (HOSPITAL_COMMUNITY): Admission: EM | Admit: 2008-04-07 | Discharge: 2008-04-09 | Payer: Self-pay | Admitting: Emergency Medicine

## 2008-04-17 ENCOUNTER — Telehealth: Payer: Self-pay | Admitting: Gastroenterology

## 2008-04-24 ENCOUNTER — Ambulatory Visit: Payer: Self-pay | Admitting: Gastroenterology

## 2008-04-24 DIAGNOSIS — K3184 Gastroparesis: Secondary | ICD-10-CM | POA: Insufficient documentation

## 2008-05-05 ENCOUNTER — Emergency Department (HOSPITAL_COMMUNITY): Admission: EM | Admit: 2008-05-05 | Discharge: 2008-05-05 | Payer: Self-pay | Admitting: Emergency Medicine

## 2008-05-07 ENCOUNTER — Encounter (HOSPITAL_COMMUNITY): Admission: RE | Admit: 2008-05-07 | Discharge: 2008-08-05 | Payer: Self-pay | Admitting: Nephrology

## 2008-05-28 ENCOUNTER — Encounter: Payer: Self-pay | Admitting: Gastroenterology

## 2008-07-02 IMAGING — CR DG LUMBAR SPINE COMPLETE 4+V
5 series · 5 of 5 positions shown · non-contrast
Comparison: 10/14/2004

LUMBAR SPINE - 4  VIEW:

CLINICAL DATA: Low back pain

[t l-spine a.p.]
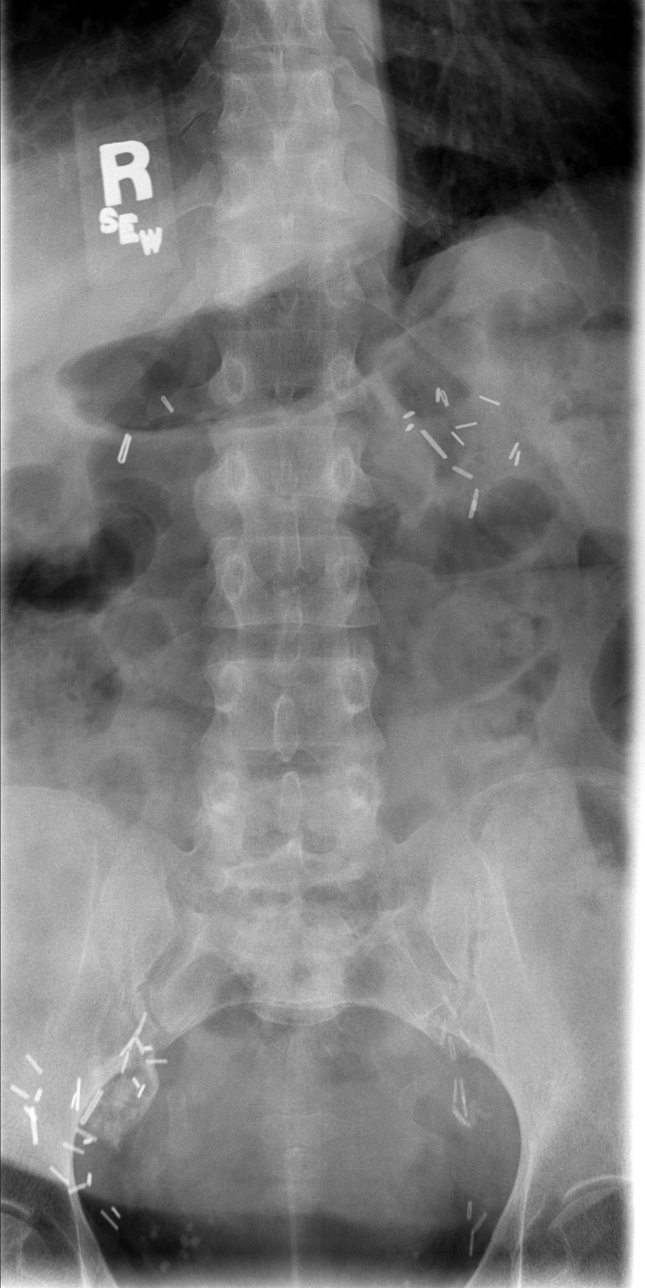

[t l-spine oblique exposure (1 of 2)]
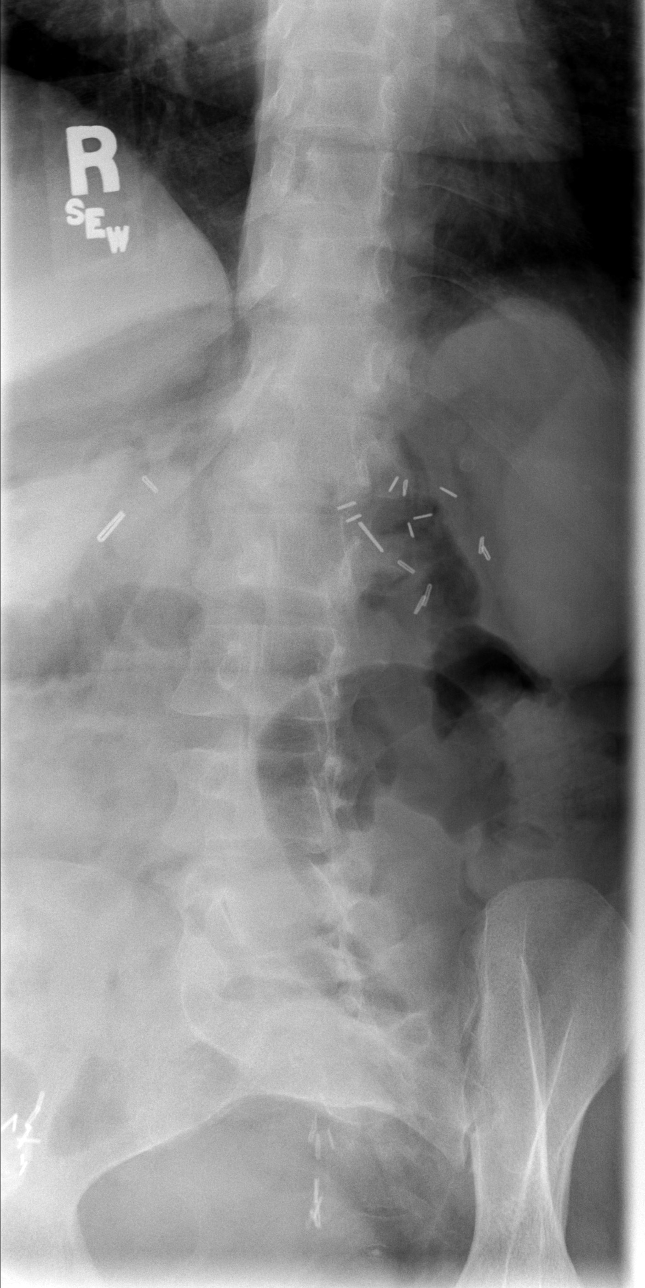

[t l-spine oblique exposure (2 of 2)]
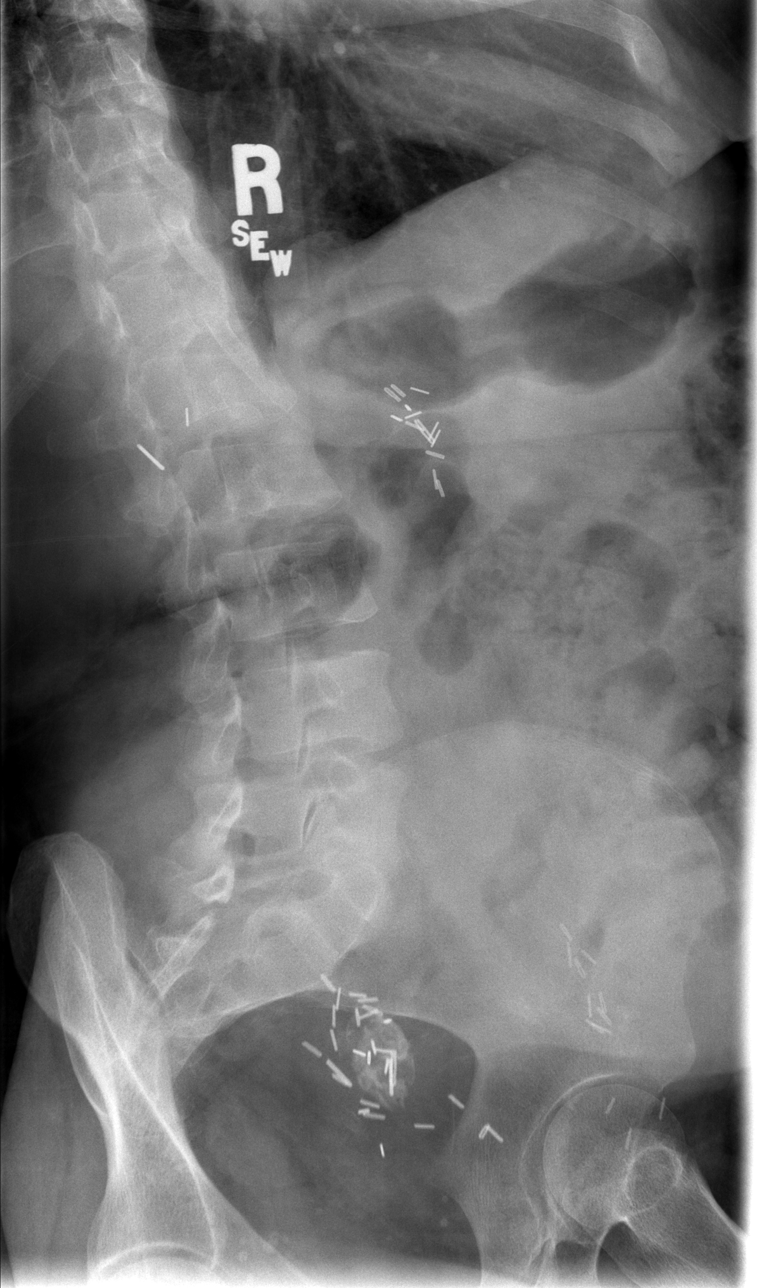

[t l-spine lat]
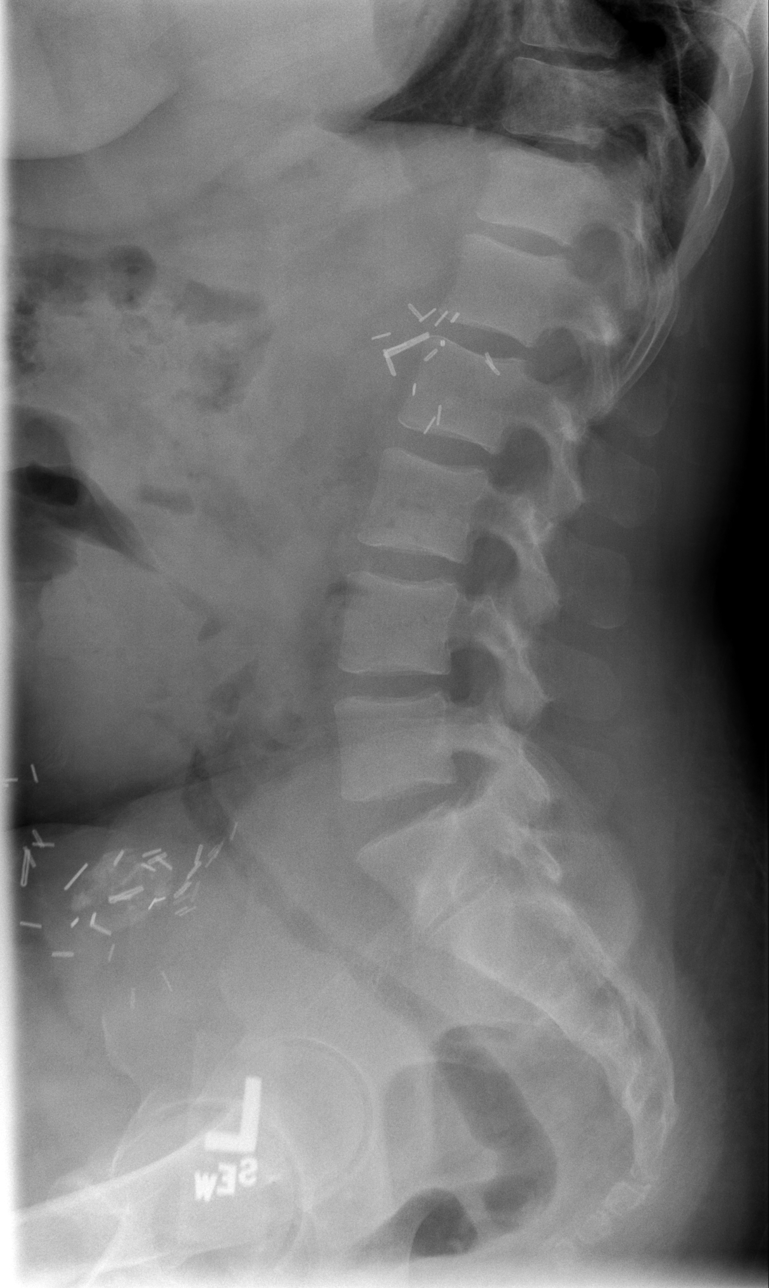

[t l-spine l5-s1 spot]
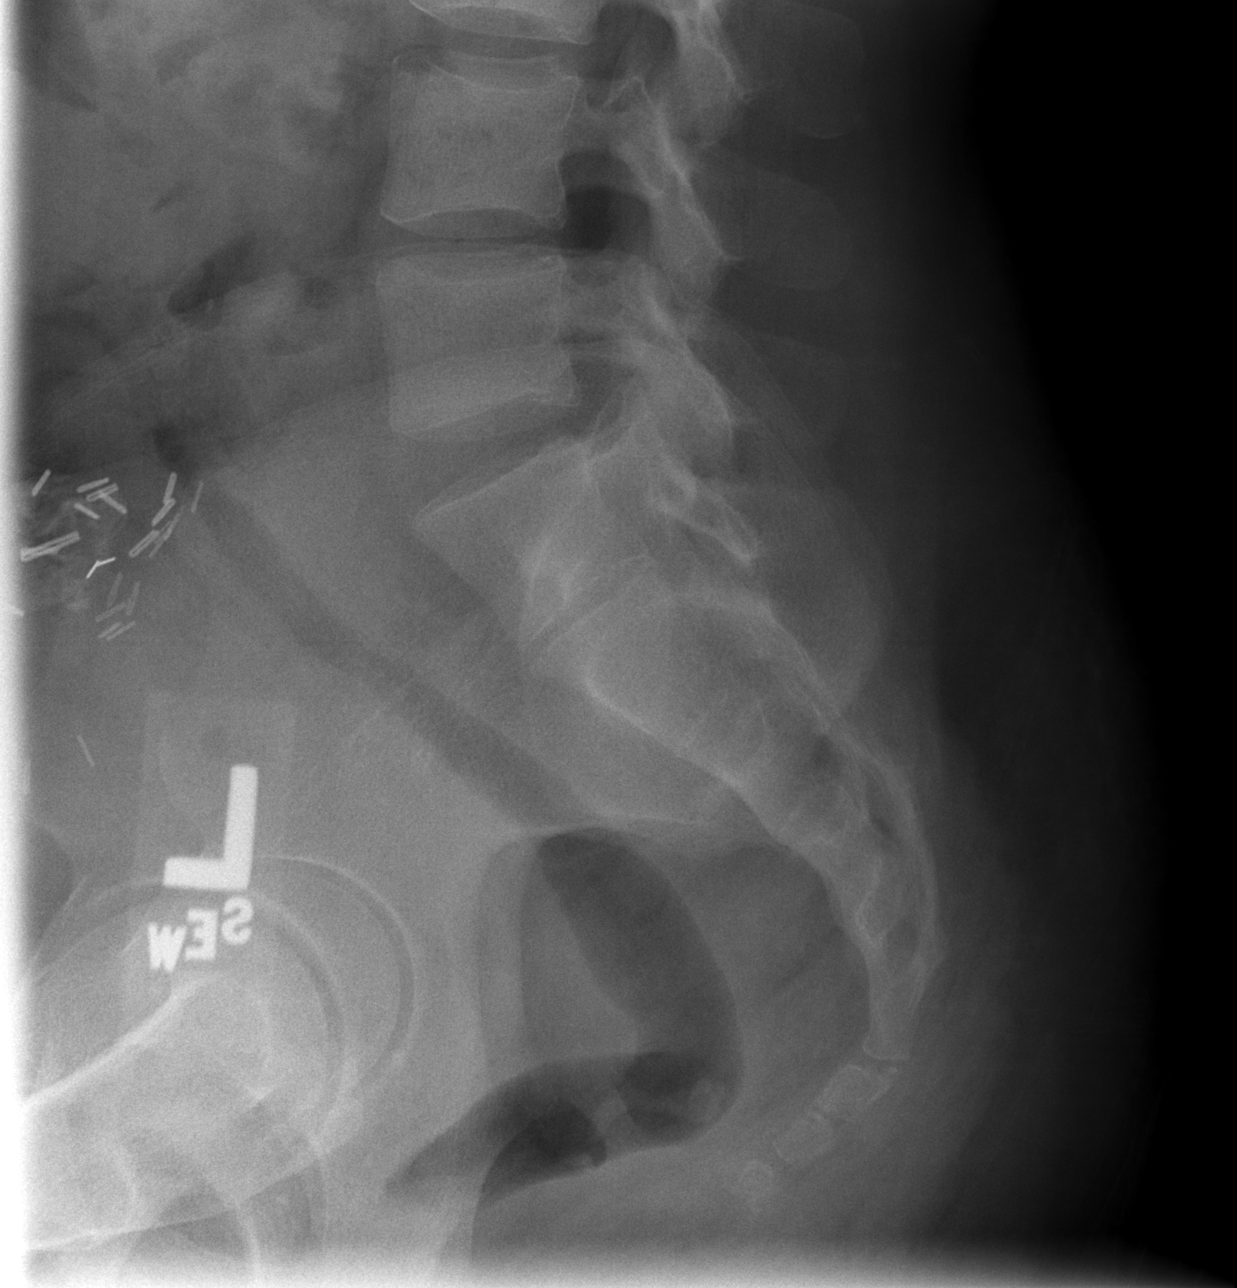

[5 of 5 positions shown; findings below may reference images not displayed]

FINDINGS: There are 5 nonrib-bearing lumbar type vertebral bodies.  Alignment
is normal and intervertebral disc spaces are preserved.  SI joints are
unremarkable.

Surgical clips are seen in the upper abdomen and in both pelvic sidewalls.
Dystrophic calcification in the right pelvic sidewall is unchanged.
IMPRESSION: Stable exam. No acute bony abnormality.

## 2008-07-17 ENCOUNTER — Emergency Department (HOSPITAL_COMMUNITY): Admission: EM | Admit: 2008-07-17 | Discharge: 2008-07-17 | Payer: Self-pay | Admitting: Emergency Medicine

## 2008-08-10 ENCOUNTER — Encounter (HOSPITAL_COMMUNITY): Admission: RE | Admit: 2008-08-10 | Discharge: 2008-11-08 | Payer: Self-pay | Admitting: Nephrology

## 2008-08-13 ENCOUNTER — Encounter: Admission: RE | Admit: 2008-08-13 | Discharge: 2008-08-13 | Payer: Self-pay | Admitting: Nephrology

## 2008-08-20 ENCOUNTER — Telehealth: Payer: Self-pay | Admitting: Gastroenterology

## 2008-08-22 ENCOUNTER — Inpatient Hospital Stay (HOSPITAL_COMMUNITY): Admission: EM | Admit: 2008-08-22 | Discharge: 2008-08-25 | Payer: Self-pay | Admitting: Emergency Medicine

## 2008-08-26 IMAGING — US US EXTREM LOW VENOUS BILAT
1 series · 14 of 24 positions shown · non-contrast
Comparison: None.

CLINICAL DATA: Bilateral leg pain.  Question, deep venous thrombosis. 

 BILATERAL LOWER EXTREMITY VENOUS DOPPLER ULTRASOUND:
TECHNIQUE: Gray scale sonography with compression, as well as color and duplex Doppler ultrasound, were performed to evaluate the deep venous system from 
 the level of the common femoral vein through the popliteal and proximal calf veins.

[Series 1: us extrem low venous bilat · 14 of 53 slices shown]
[im 1/53]
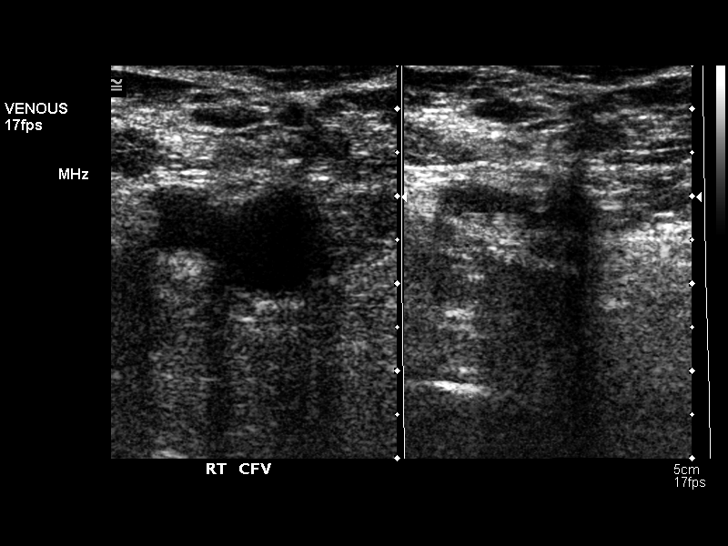
[im 5/53]
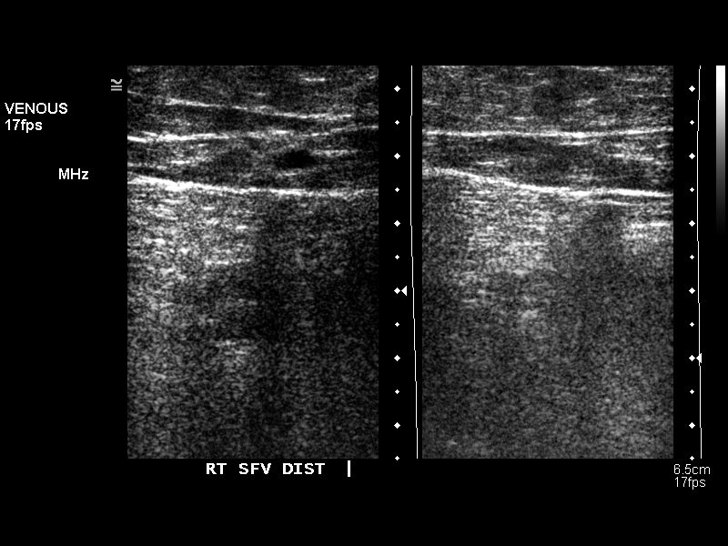
[im 10/53]
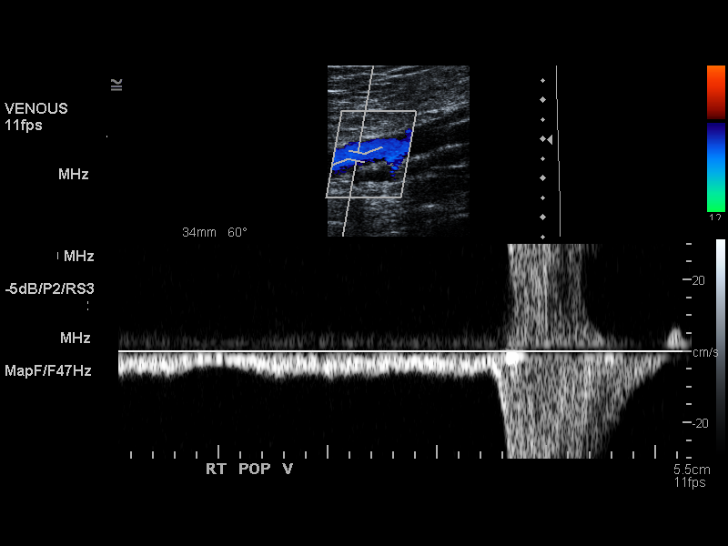
[im 14/53]
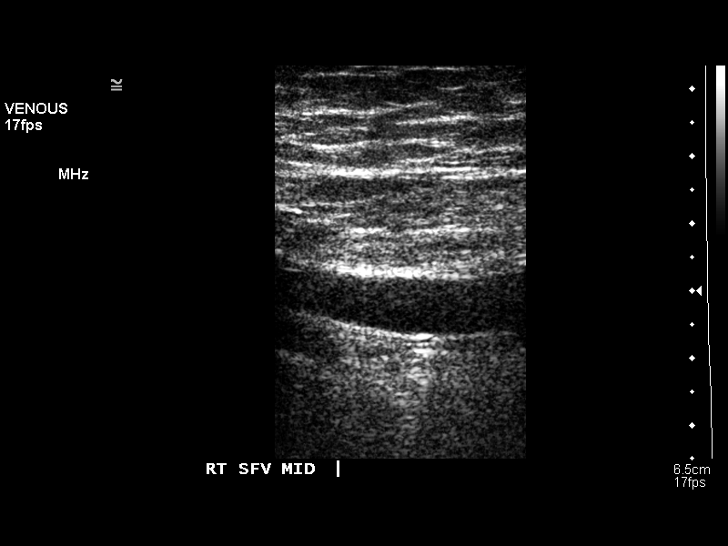
[im 16/53]
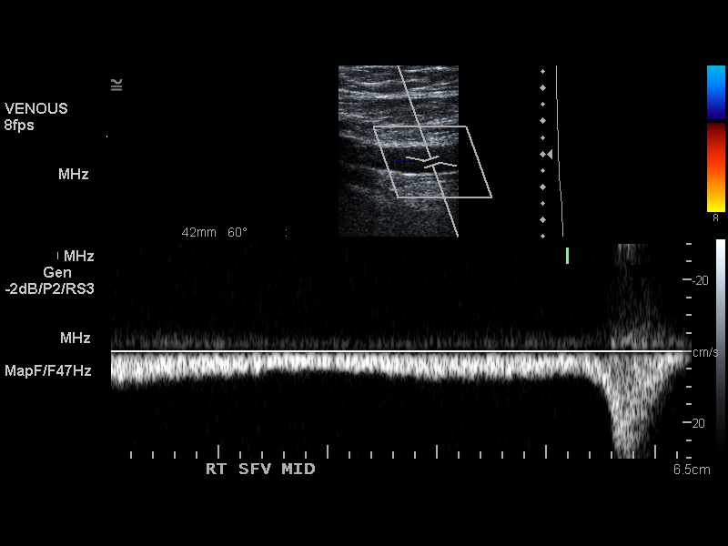
[im 21/53]
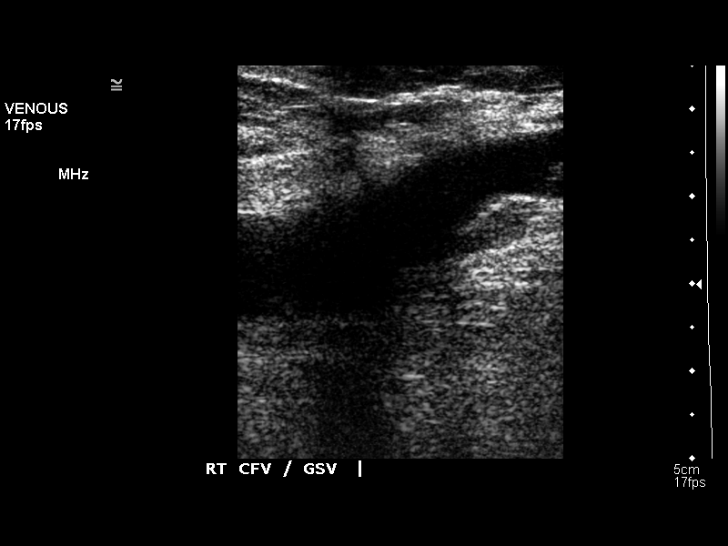
[im 25/53]
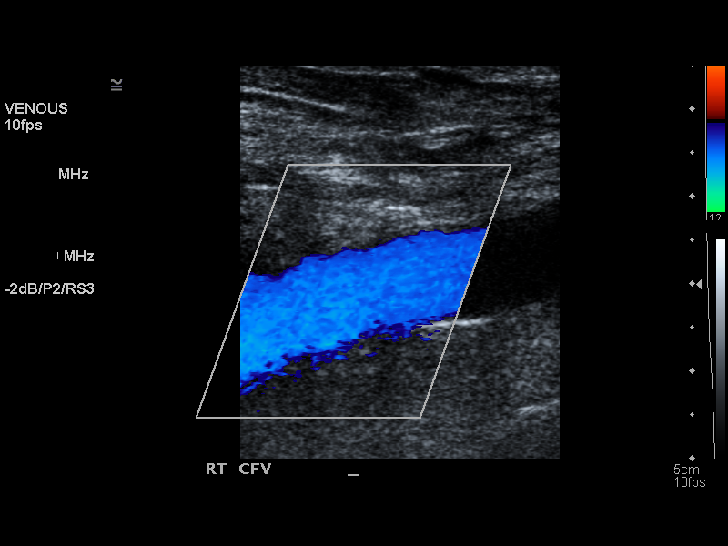
[im 28/53]
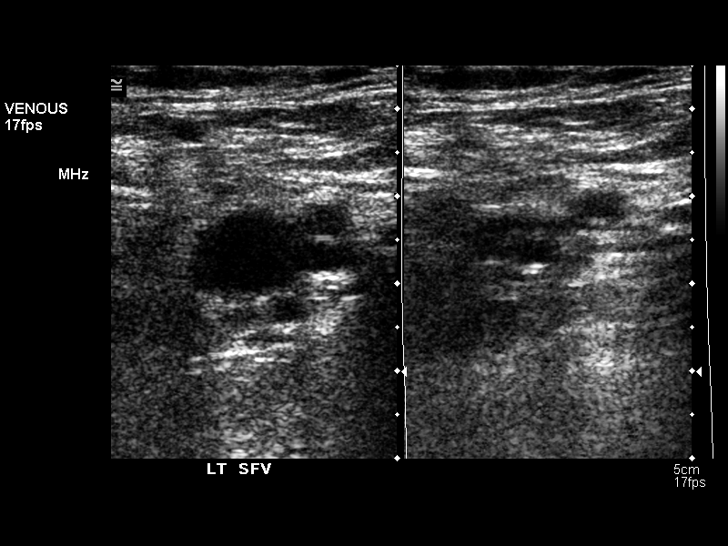
[im 32/53]
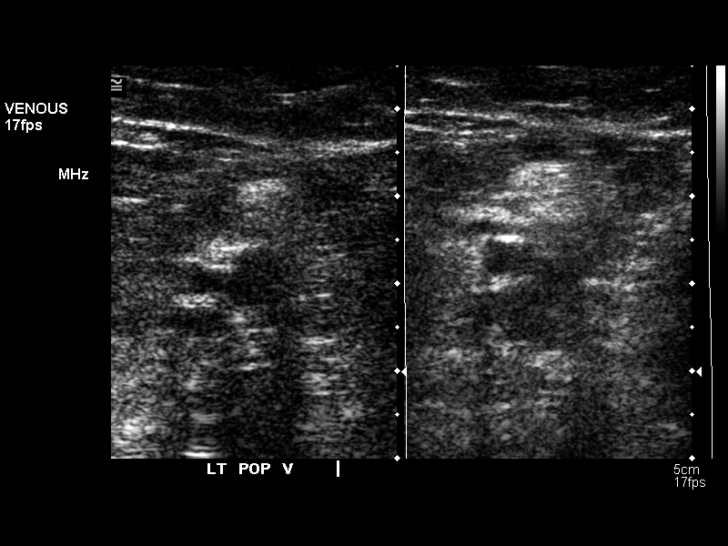
[im 37/53]
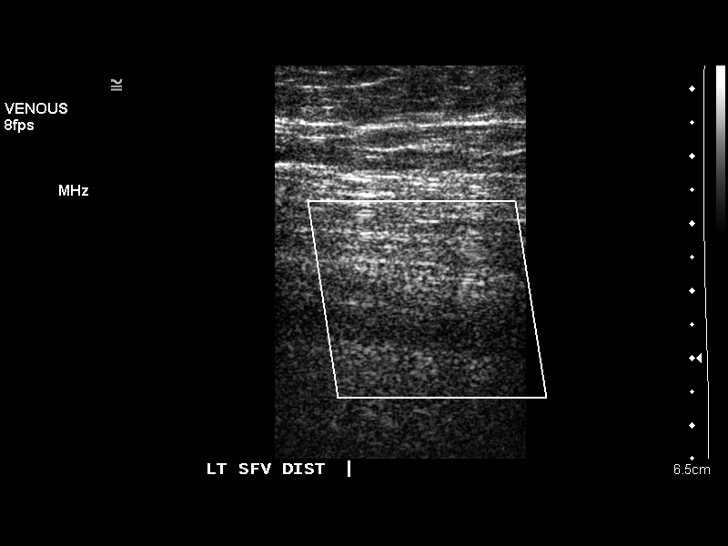
[im 41/53]
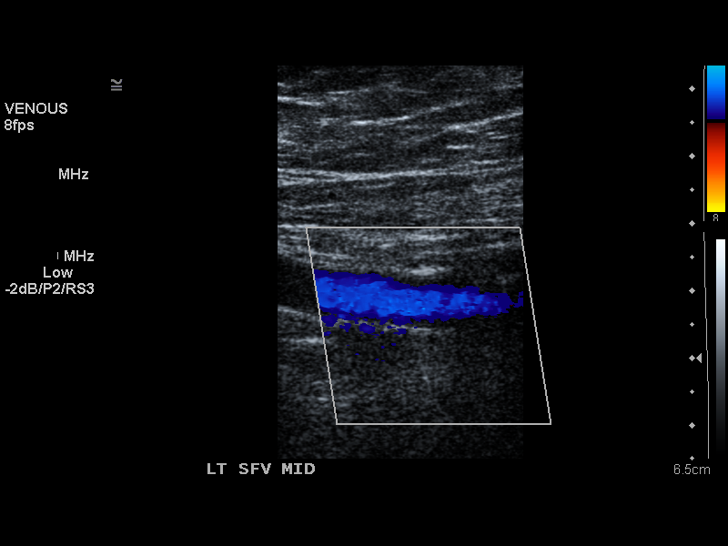
[im 43/53]
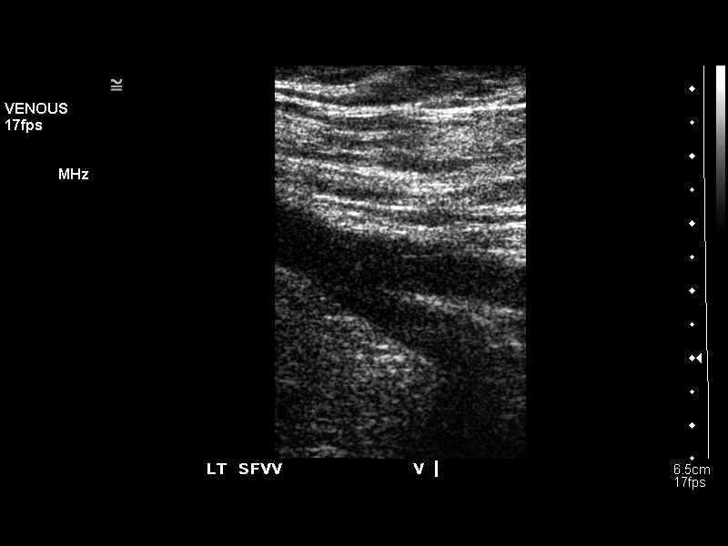
[im 48/53]
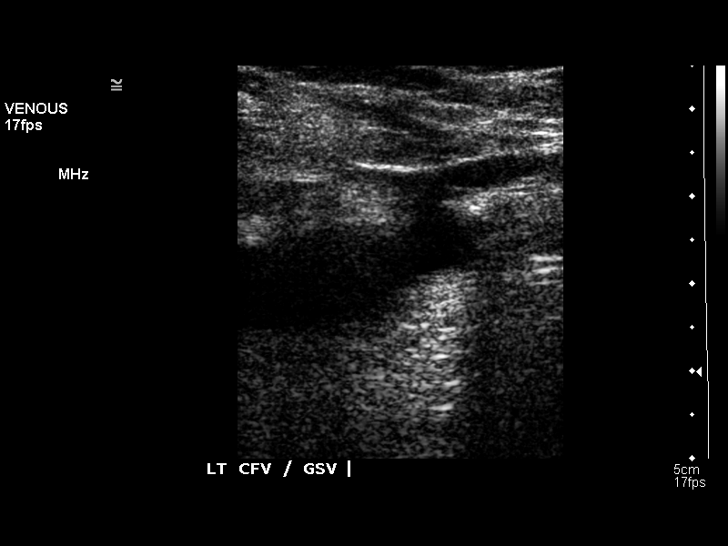
[im 53/53]
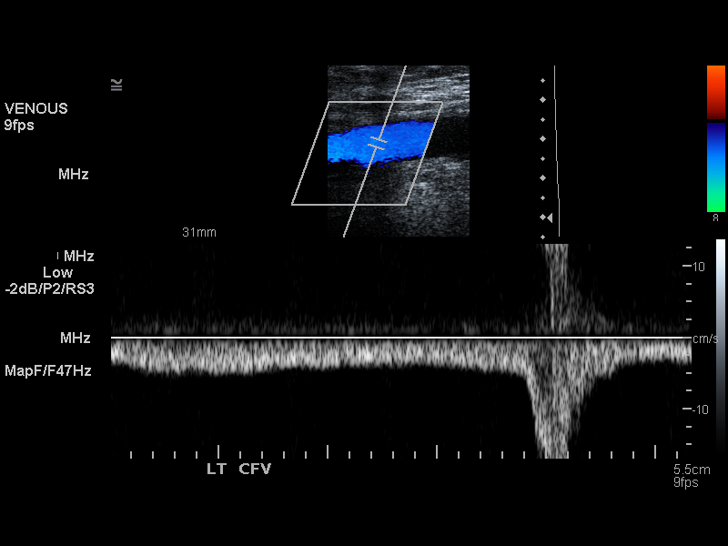

[14 of 24 positions shown; findings below may reference images not displayed]

FINDINGS: There is normal flow, compressibility, and augmentation in the common femoral, superficial femoral, and popliteal veins bilaterally.  Somewhat slow flow is seen in the left leg.
IMPRESSION: No evidence of deep venous thrombosis in the lower extremities bilaterally.

## 2008-09-12 ENCOUNTER — Telehealth (INDEPENDENT_AMBULATORY_CARE_PROVIDER_SITE_OTHER): Payer: Self-pay | Admitting: *Deleted

## 2008-10-03 ENCOUNTER — Ambulatory Visit: Payer: Self-pay | Admitting: Gastroenterology

## 2008-11-08 ENCOUNTER — Encounter (HOSPITAL_COMMUNITY): Admission: RE | Admit: 2008-11-08 | Discharge: 2009-02-06 | Payer: Self-pay | Admitting: Nephrology

## 2009-02-08 IMAGING — US US TRANSVAGINAL NON-OB
1 series · 14 of 25 positions shown · non-contrast
Comparison: Outside ultrasound is not available for direct comparison.

CLINICAL DATA: Pelvic fluid collection versus ovarian cyst on outside ultrasound.  
 TRANSABDOMINAL AND TRANSVAGINAL PELVIC ULTRASOUND:
TECHNIQUE: Both transabdominal and transvaginal ultrasound examinations of the pelvis were performed including evaluation of the uterus, ovaries, adnexal regions, and pelvic cul-de-sac.

[Series 1: unknown · 0.32mm/px · 14 of 63 slices shown]
[im 1/63]
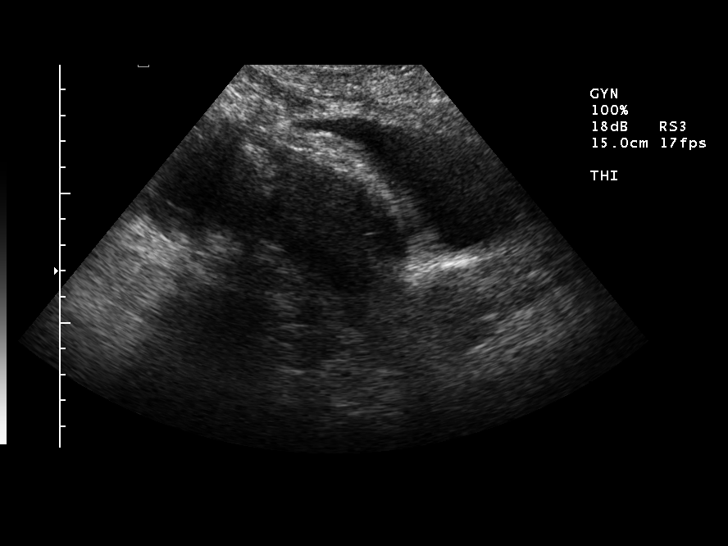
[im 6/63]
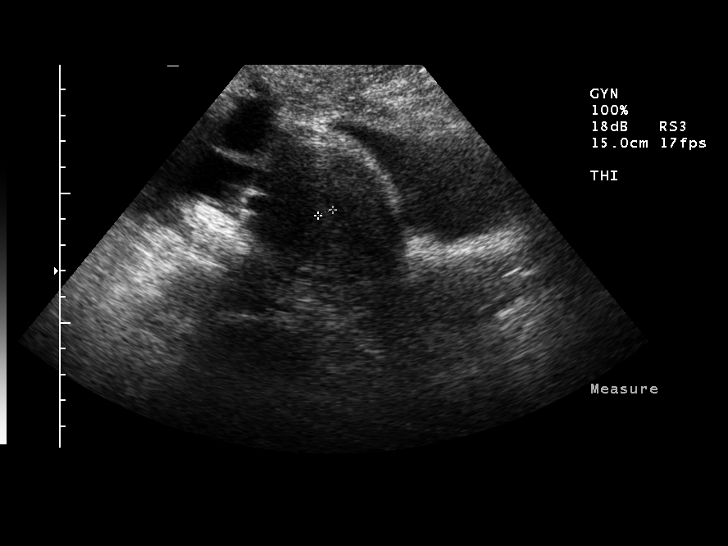
[im 11/63]
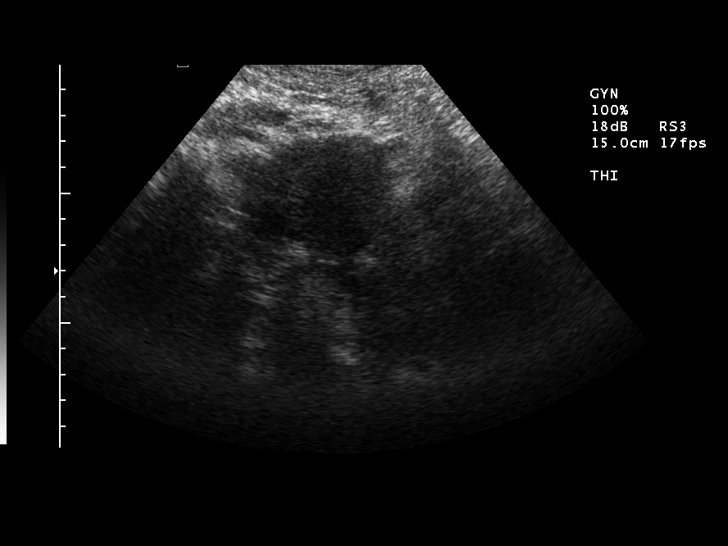
[im 16/63]
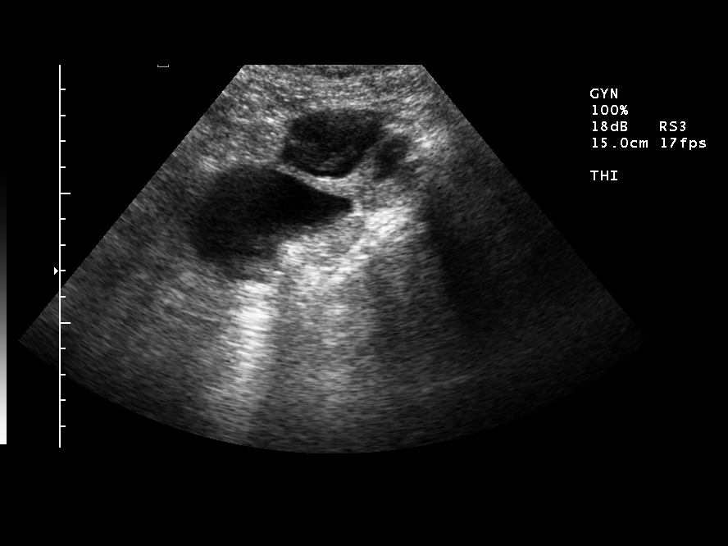
[im 21/63]
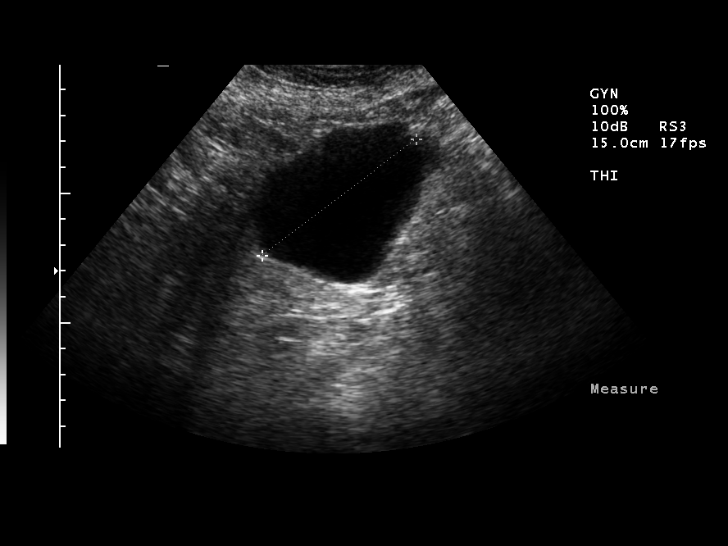
[im 24/63]
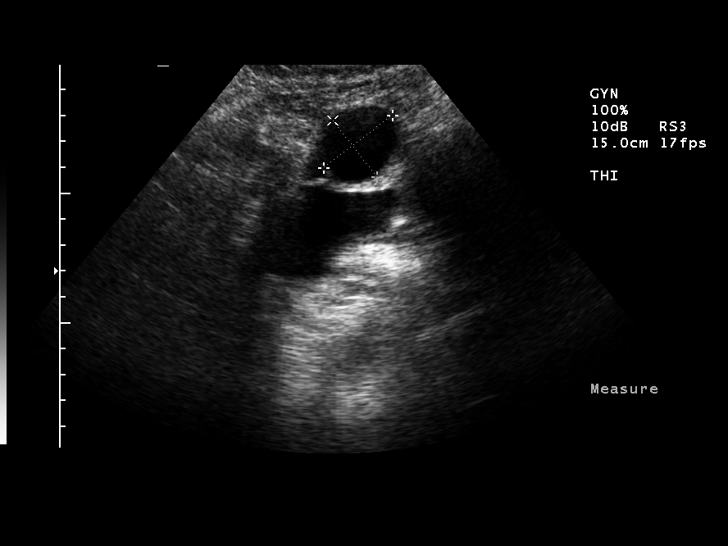
[im 29/63]
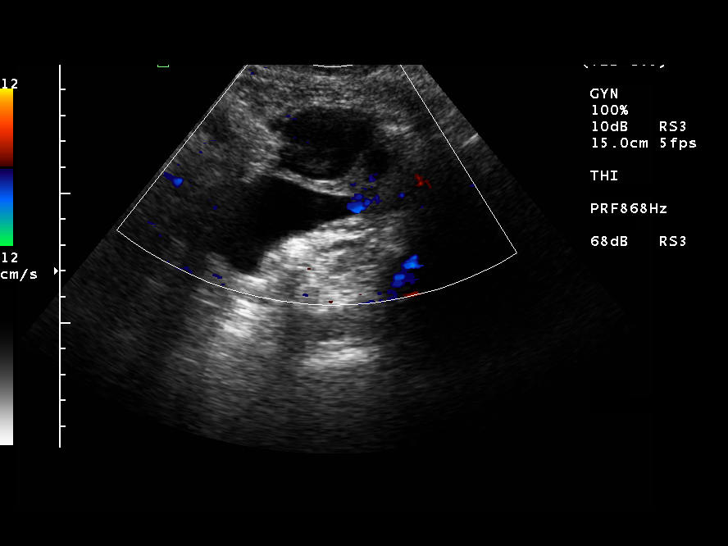
[im 34/63]
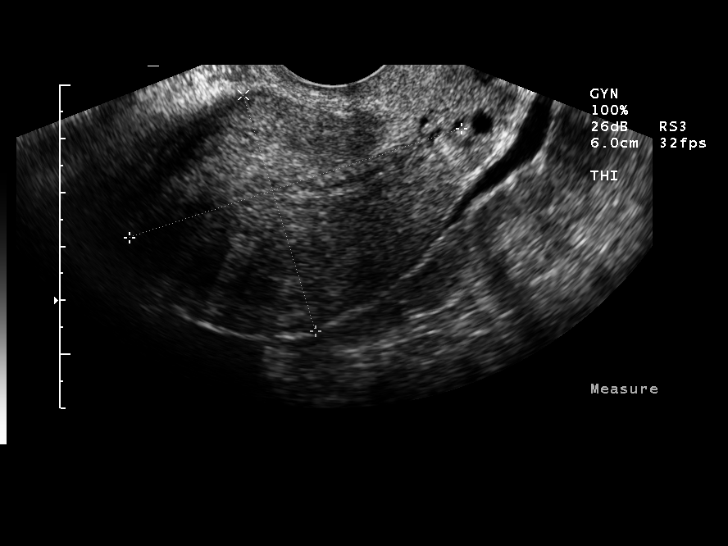
[im 39/63]
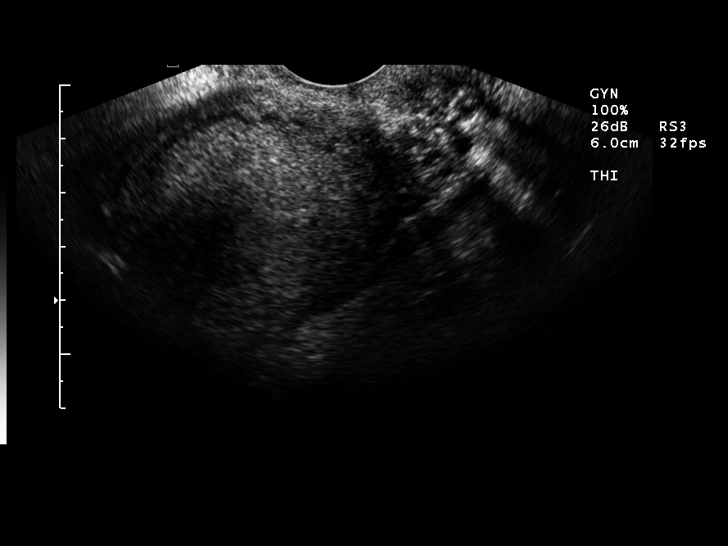
[im 42/63]
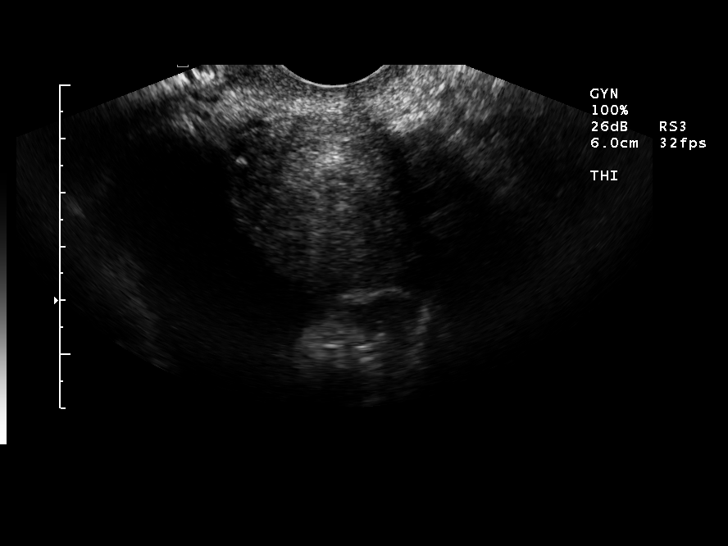
[im 47/63]
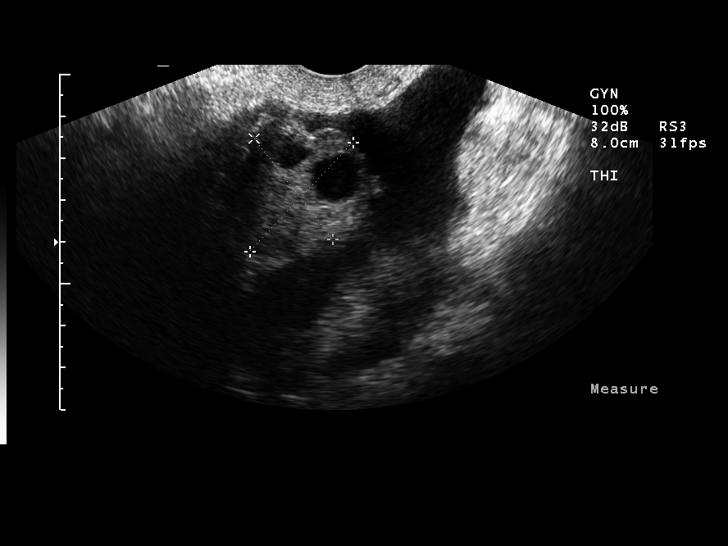
[im 52/63]
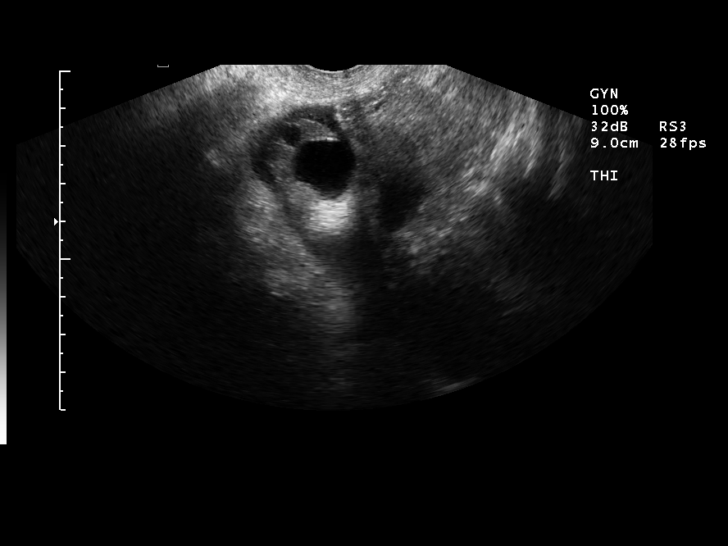
[im 57/63]
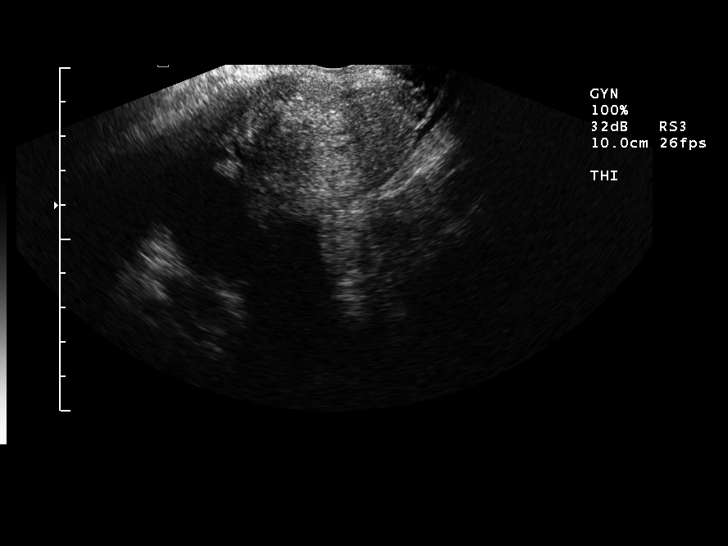
[im 63/63]
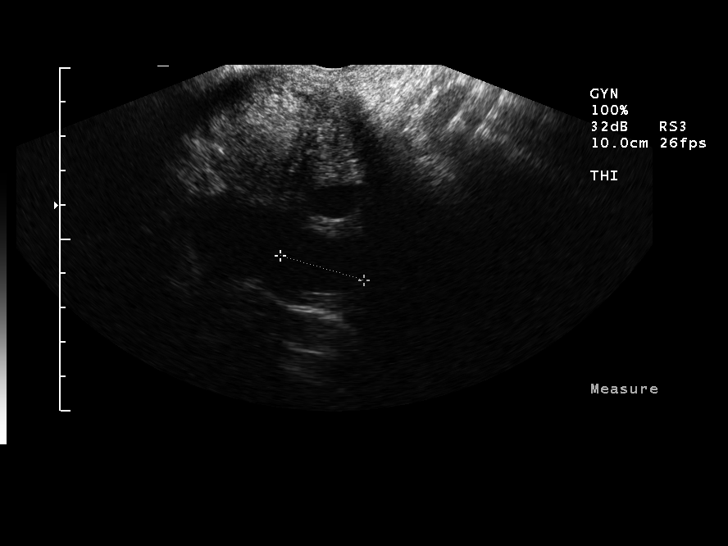

[14 of 25 positions shown; findings below may reference images not displayed]

FINDINGS: Uterus 10.1 x 5.3 x 4.3 cm.  Endometrial stripe measures 3 mm and is thin and echogenic.  Posterior uterine fundal intramural fibroid noted, 1.6 x 1.5 x 1.4 cm.  No appreciable mass effect upon the endometrial stripe is noted.  Right ovary is normal.  Moderate free fluid is evident.  Left ovary contains a simple-appearing 3.0 x 2.6 x 2.5 cm cyst.
IMPRESSION: 1.  Simple-appearing left ovarian cyst. 
 2.  Moderate free fluid may be physiologic or related to cyst rupture.  
 3.  Fundal intramural fibroid.

## 2009-02-18 ENCOUNTER — Encounter (HOSPITAL_COMMUNITY): Admission: RE | Admit: 2009-02-18 | Discharge: 2009-05-19 | Payer: Self-pay | Admitting: Nephrology

## 2009-04-29 ENCOUNTER — Emergency Department (HOSPITAL_COMMUNITY): Admission: EM | Admit: 2009-04-29 | Discharge: 2009-04-29 | Payer: Self-pay | Admitting: Emergency Medicine

## 2009-05-02 ENCOUNTER — Emergency Department (HOSPITAL_COMMUNITY): Admission: EM | Admit: 2009-05-02 | Discharge: 2009-05-03 | Payer: Self-pay | Admitting: Emergency Medicine

## 2009-05-23 ENCOUNTER — Encounter: Admission: RE | Admit: 2009-05-23 | Discharge: 2009-08-21 | Payer: Self-pay | Admitting: Orthopaedic Surgery

## 2009-05-30 ENCOUNTER — Encounter (HOSPITAL_COMMUNITY): Admission: RE | Admit: 2009-05-30 | Discharge: 2009-08-28 | Payer: Self-pay | Admitting: Nephrology

## 2009-05-30 ENCOUNTER — Encounter: Payer: Self-pay | Admitting: Gastroenterology

## 2009-07-26 ENCOUNTER — Ambulatory Visit: Payer: Self-pay | Admitting: Gastroenterology

## 2009-08-01 ENCOUNTER — Telehealth (INDEPENDENT_AMBULATORY_CARE_PROVIDER_SITE_OTHER): Payer: Self-pay | Admitting: *Deleted

## 2009-08-29 ENCOUNTER — Encounter (HOSPITAL_COMMUNITY): Admission: RE | Admit: 2009-08-29 | Discharge: 2009-11-27 | Payer: Self-pay | Admitting: Nephrology

## 2009-10-15 IMAGING — CR DG LUMBAR SPINE COMPLETE 4+V
5 series · 5 of 5 positions shown · non-contrast
Comparison: [REDACTED] lumbar spine radiographs 04/15/2006 and [REDACTED]
abdominal pelvic CT 03/18/2004.

CLINICAL DATA: Back pain.  Right pelvic renal transplant and left
nephrectomy.

LUMBAR SPINE - COMPLETE 4+ VIEW

[view not recorded (1 of 5)]
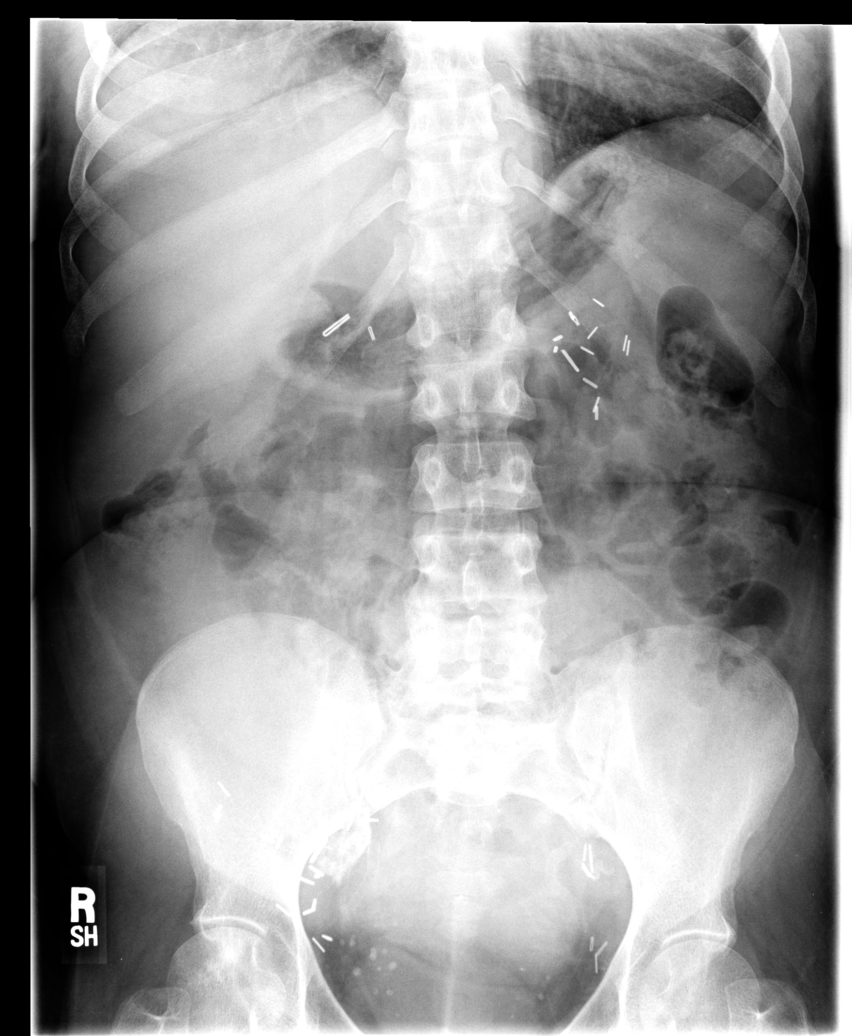

[view not recorded (2 of 5)]
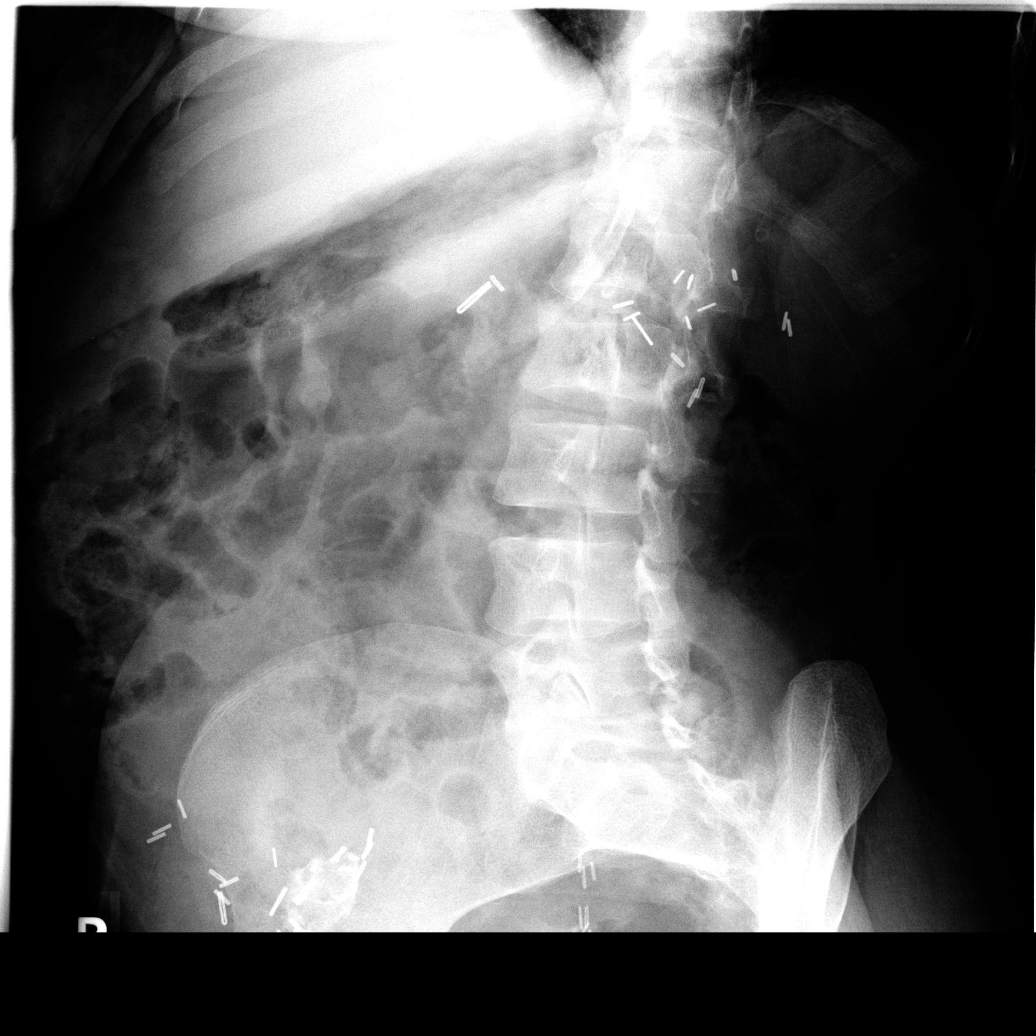

[view not recorded (3 of 5)]
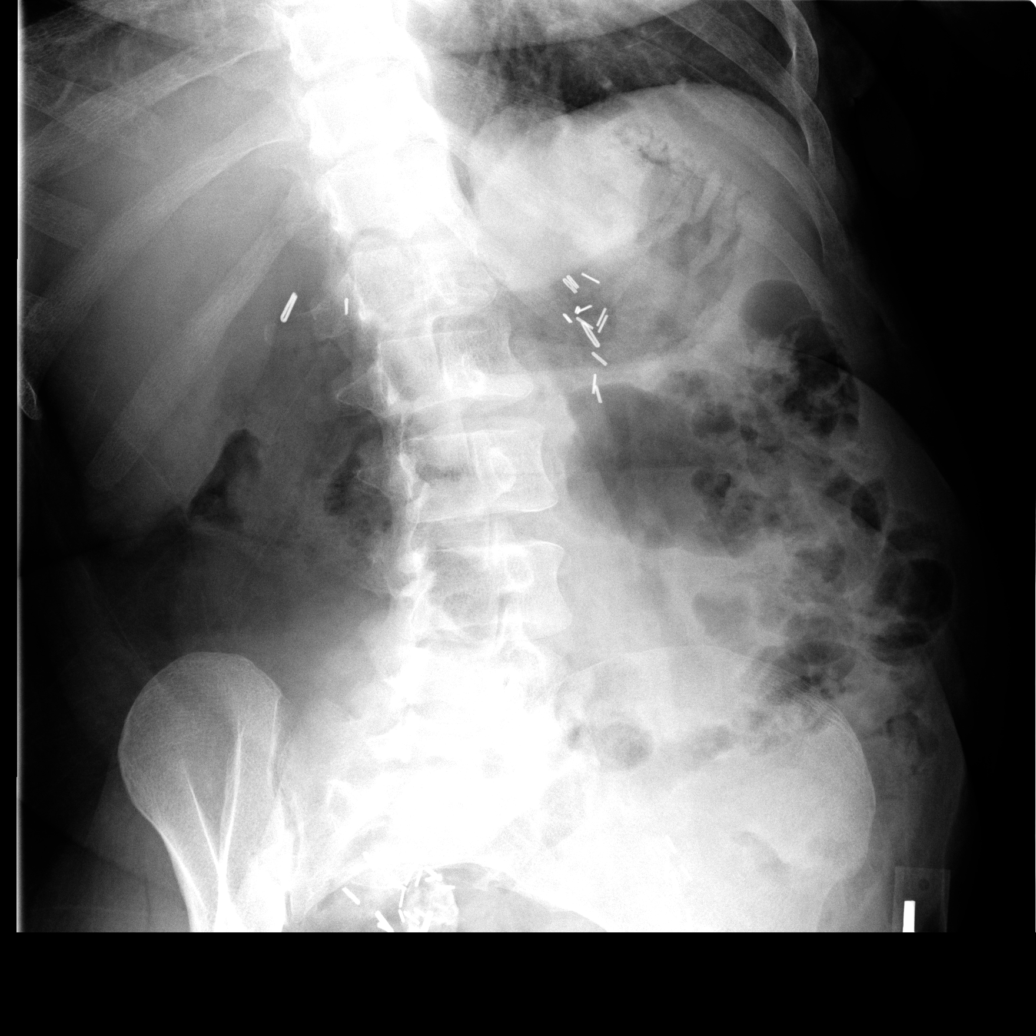

[view not recorded (4 of 5)]
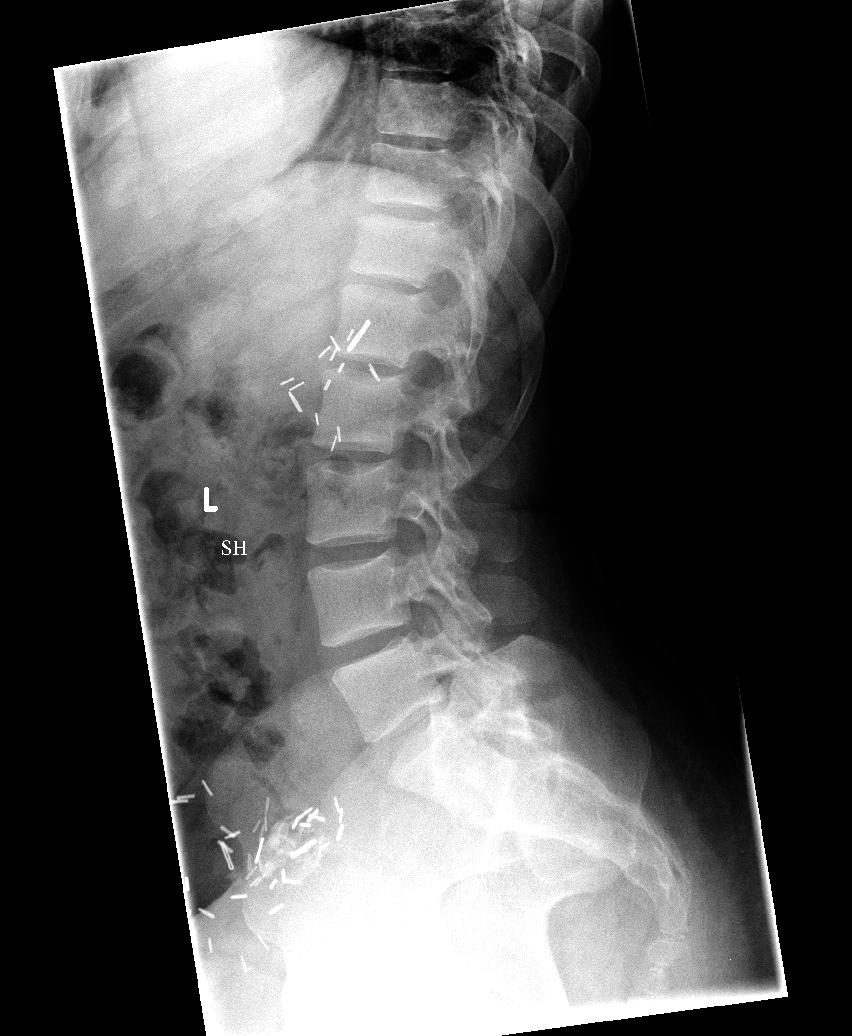

[view not recorded (5 of 5)]
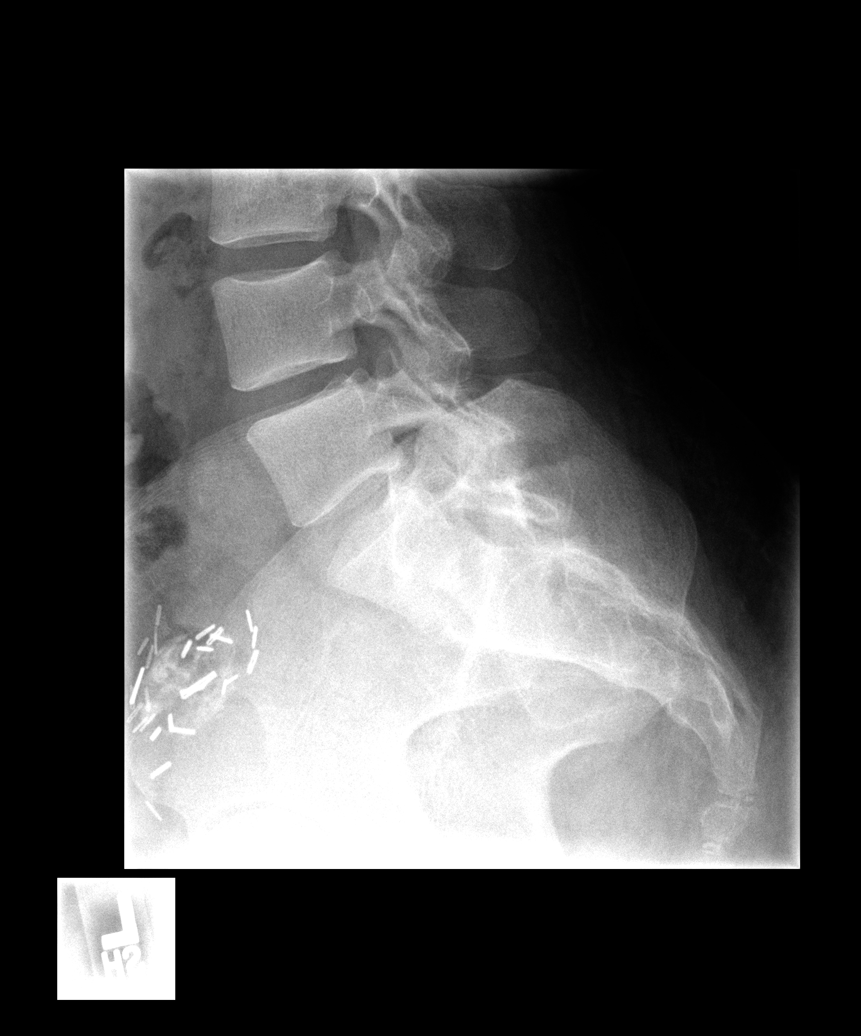

[5 of 5 positions shown; findings below may reference images not displayed]

FINDINGS: Again seen five non-rib bearing lumbar vertebrae with
bone density suggesting slight renovascular osteodystrophy.
Borderline to slight degenerative disc space narrowing is seen at
L4-5.  Remaining thoracolumbar disc spaces and vertebral alignment
normally maintained.  No new osseous lesion or compression fracture
is seen.  The stable focal calcification at the right pelvis
measuring approximately 3 x 2 cm and multiple upper and pelvic
surgical clips unchanged.  Stable subchondral sclerosis right
femoral head is seen without interval arthropathy or avascular
necrosis in the right femoral head.  No other new significant
abnormality noted.
IMPRESSION: 1.  Borderline to slight degenerative disc space narrowing L4-5
with slight renovascular osteodystrophy bone density findings.
2.  Stable chronic right pelvic ovoid calcification and post
surgical change.
3.  No new acute findings.

## 2009-10-16 ENCOUNTER — Encounter: Admission: RE | Admit: 2009-10-16 | Discharge: 2009-10-16 | Payer: Self-pay | Admitting: Nephrology

## 2009-10-28 IMAGING — MG MM SCREEN MAMMOGRAM BILATERAL
4 series · 4 of 4 positions shown · non-contrast
Comparison: none

DG SCREEN MAMMOGRAM BILATERAL
Bilateral CC and MLO view(s) were taken.

DIGITAL SCREENING MAMMOGRAM WITH CAD:
There are scattered fibroglandular densities.  No masses or malignant type calcifications are 
identified.  Compared with prior studies.

[R CC]
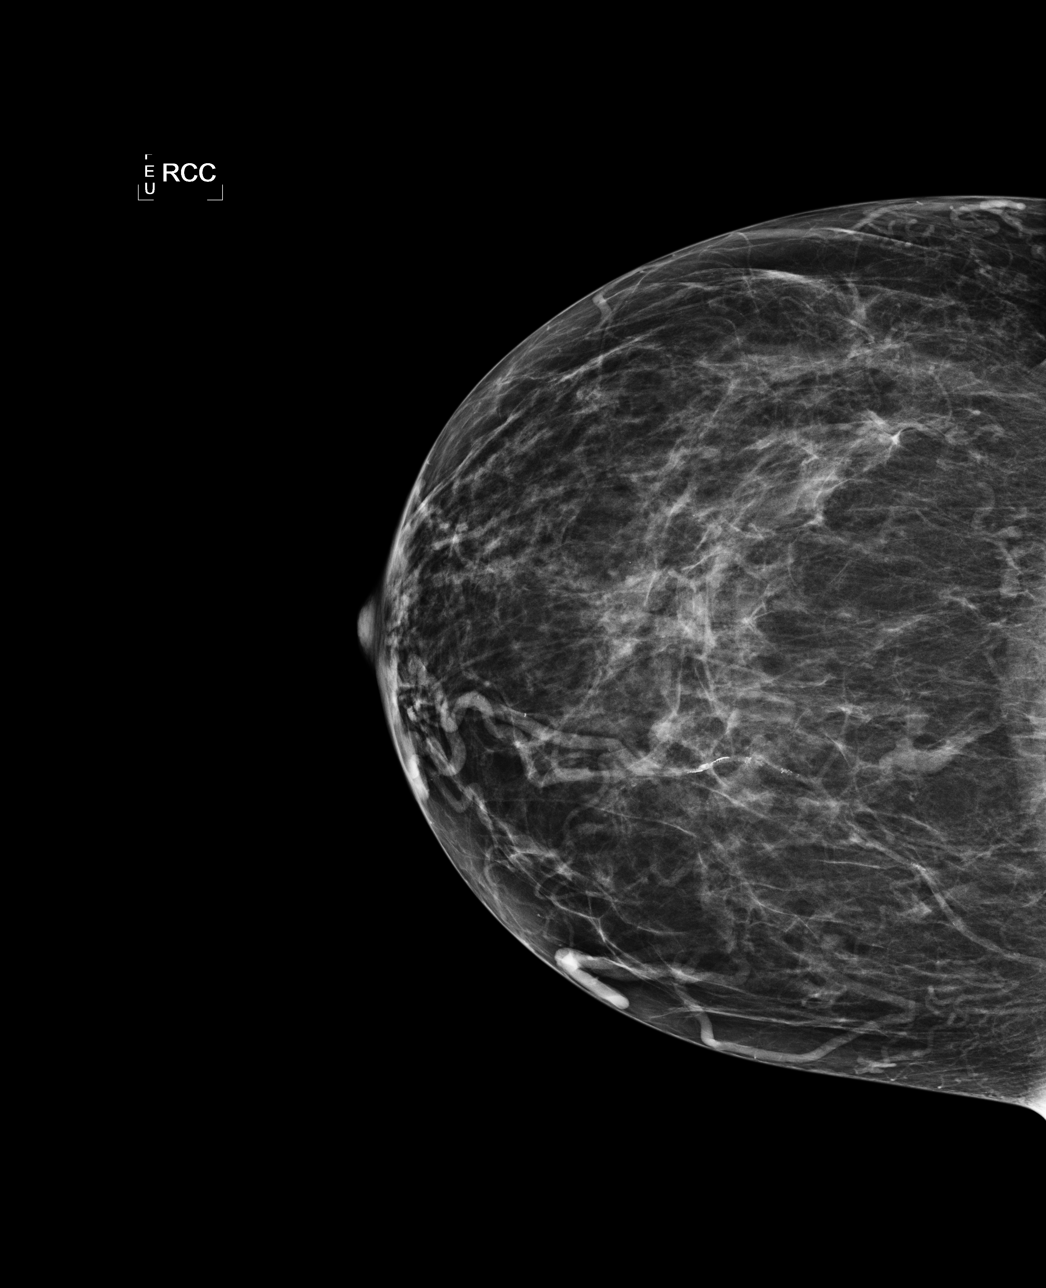

[L CC]
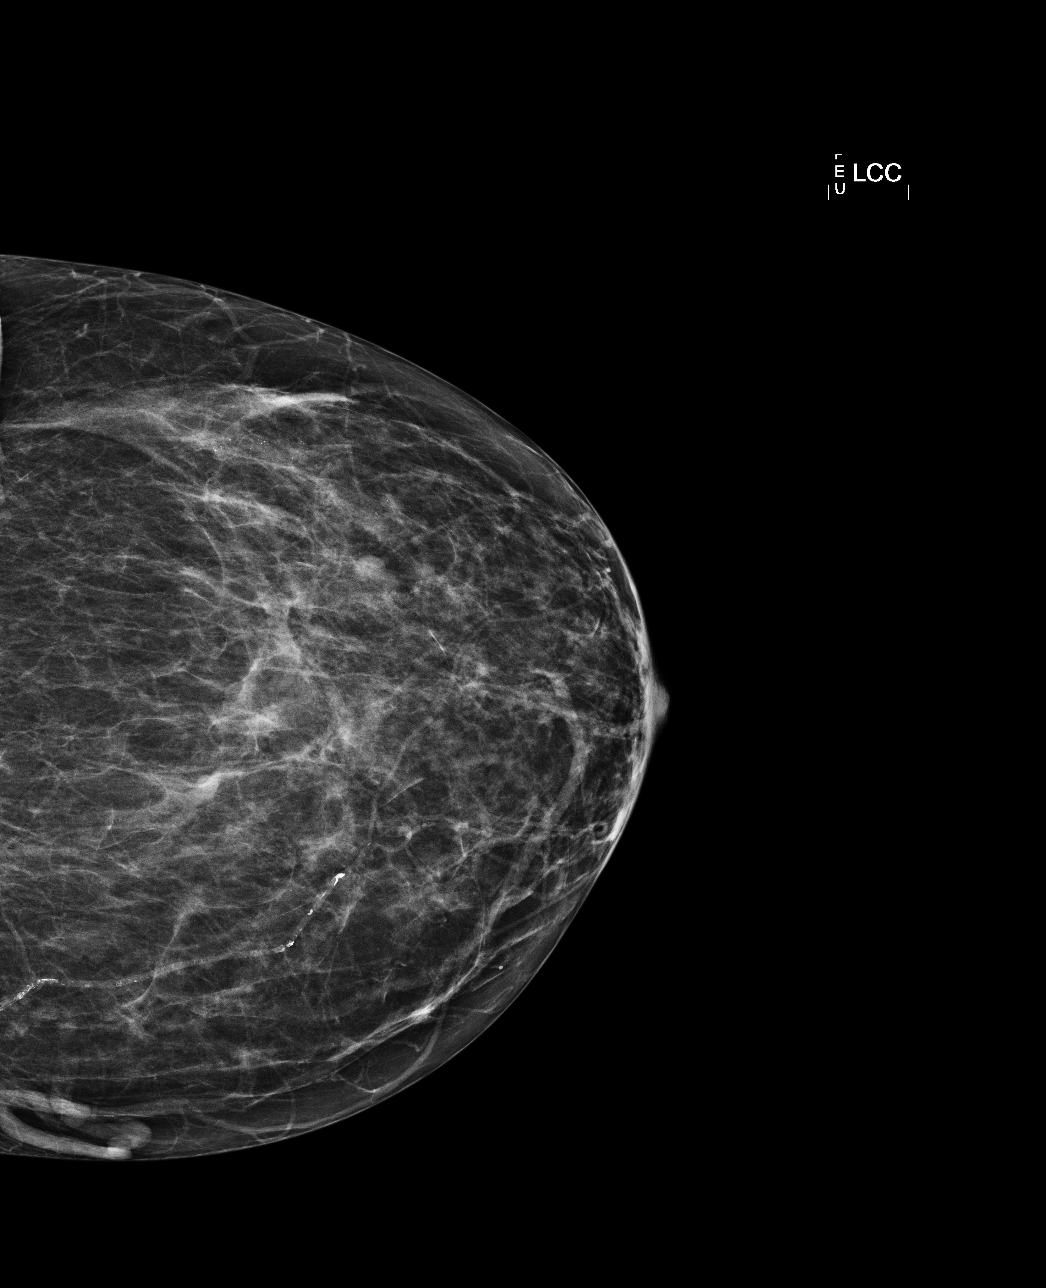

[L MLO]
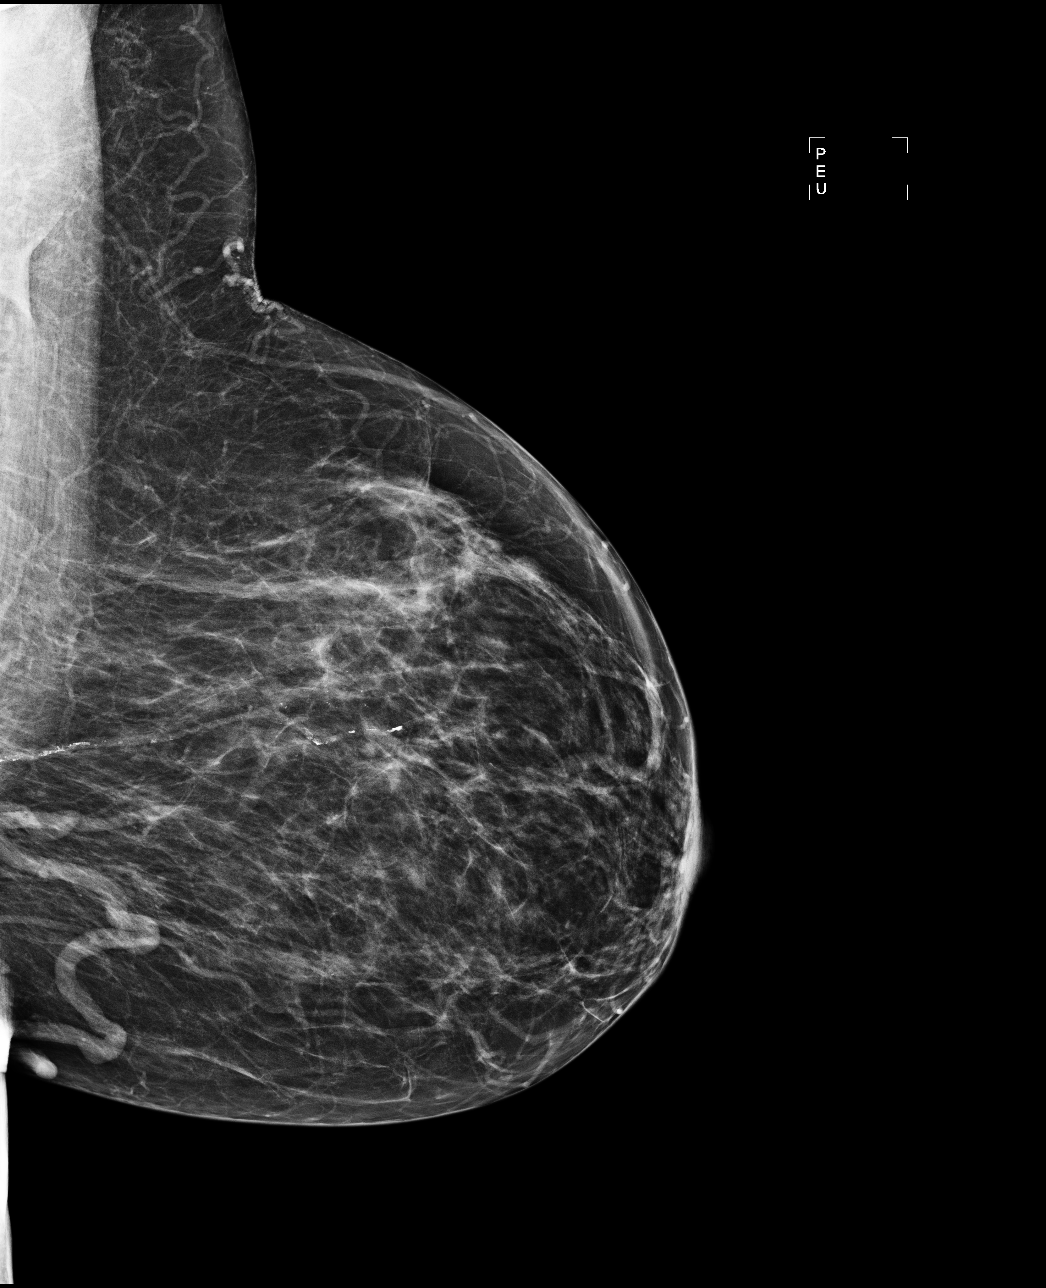

[R MLO]
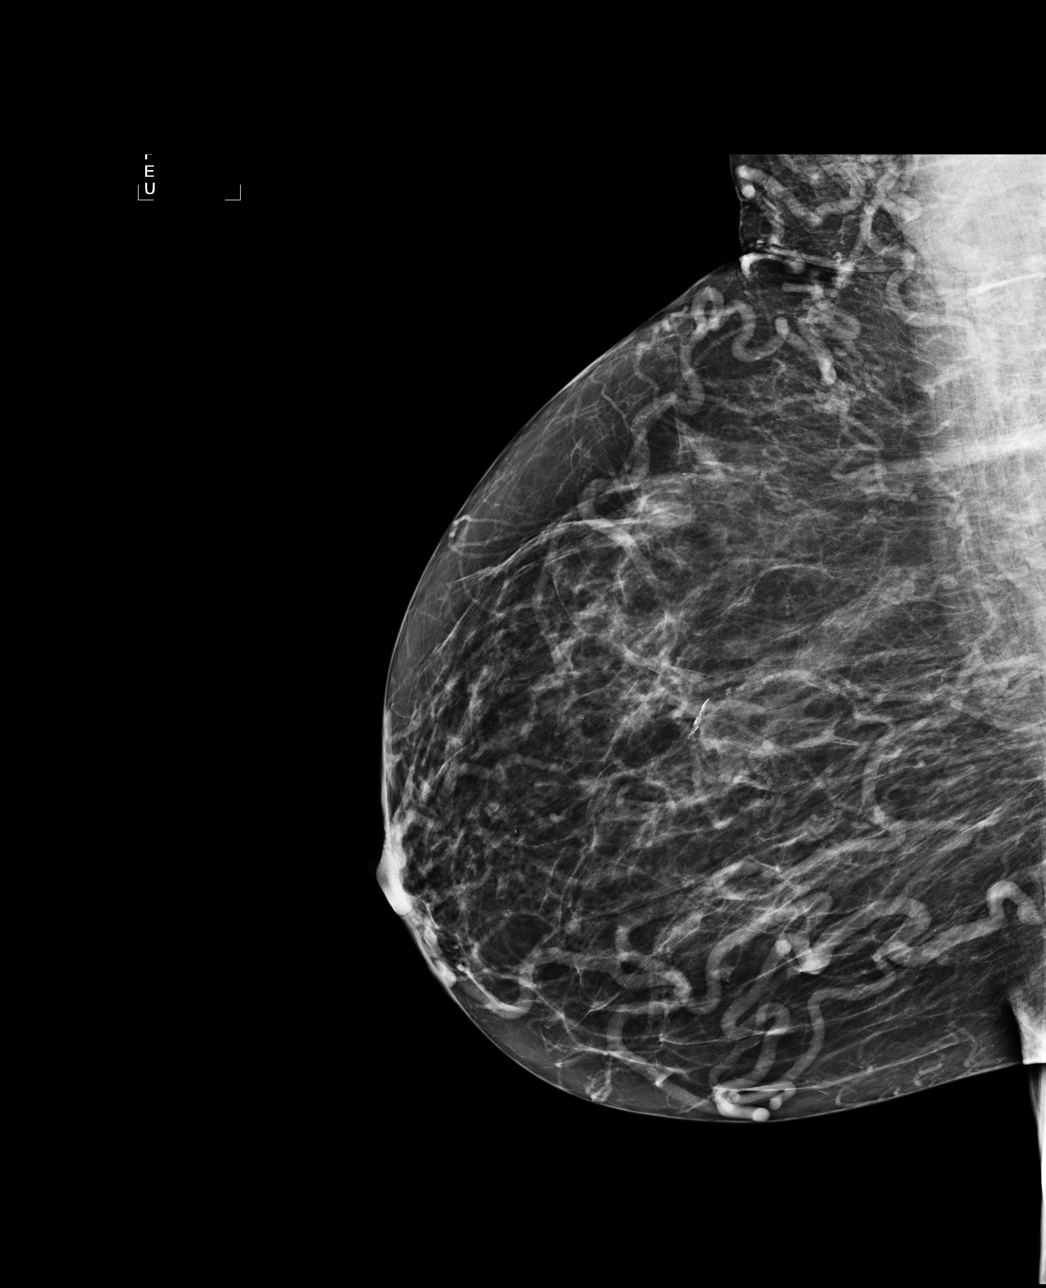

[4 of 4 positions shown; findings below may reference images not displayed]

IMPRESSION: No specific mammographic evidence of malignancy.  Next screening mammogram is recommended in one 
year.

ASSESSMENT: Negative - BI-RADS 1

Screening mammogram in 1 year.
ANALYZED BY COMPUTER AIDED DETECTION. , THIS PROCEDURE WAS A DIGITAL MAMMOGRAM.

## 2009-12-06 ENCOUNTER — Encounter (HOSPITAL_COMMUNITY)
Admission: RE | Admit: 2009-12-06 | Discharge: 2010-03-04 | Payer: Self-pay | Source: Home / Self Care | Attending: Nephrology | Admitting: Nephrology

## 2010-02-17 LAB — POCT HEMOGLOBIN-HEMACUE: Hemoglobin: 11.2 g/dL — ABNORMAL LOW (ref 12.0–15.0)

## 2010-02-22 ENCOUNTER — Encounter: Payer: Self-pay | Admitting: Nephrology

## 2010-02-23 ENCOUNTER — Encounter: Payer: Self-pay | Admitting: Nephrology

## 2010-03-04 NOTE — Assessment & Plan Note (Signed)
History of Present Illness Visit Type: consult Primary GI MD: Owens Loffler MD Primary Provider: Donato Heinz, MD Requesting Provider: Donato Heinz, MD Chief Complaint:  Pt c/o 4 months nausea and vomiting.  She has also lost 30 pounds in 4-5 months unintentionally. History of Present Illness:      3-4 months of n/vomitting.  Feels bad 2-3 days a week. Usually last from earlly AM and then for 3-4 hours.  No pyrosis.    Pain usually occures before the n/v.  No obvious distention.  Had a stomach virus in 3/09 (everyone in a neighbors hours had n/v), she had n/v/diarrhea.  March was the worst month and she seems to be improving.  March she lost 30 pounds and then has lost another several since then.  She still has diarrhea periodically.  Alternates with constipation.  she had blood tests done most recently June 2009 showed a very high serum ferritin 2000, normal white count normal hemoglobin, low MCV, normal platelets normal liver tests. Creatinine 1.4.  Nothing has helped but she hasn't tried the zofran given last month.  No medicine changes in past several months.  She is currently not taking anything for nausea and hasn't for least a month because she is moved and can't find her pills.   GI Review of Systems    Reports weight loss.   Weight loss of 30 pounds over 3-4 months.         Prior Medications Reviewed Using: List Brought by Patient  Updated Prior Medication List: PROGRAF 1 MG  CAPS (TACROLIMUS) Take 2 tablets by mouth at bedtime MYFORTIC 360 MG  TBEC (MYCOPHENOLATE SODIUM) Take 1 tablet by mouth two times a day PREDNISONE 5 MG  TABS (PREDNISONE) Take 1 tablet by mouth every morning ASPIRIN 81 MG  TBEC (ASPIRIN) Take 1 tablet by mouth once a day OMEPRAZOLE 40 MG  CPDR (OMEPRAZOLE) Take 2 tablets by mouth at bedtime ZINCATE 220 MG  CAPS (ZINC SULFATE) Take 1 tablet by mouth once daily CALCITRIOL 0.5 MCG  CAPS (CALCITRIOL) Take 1 tablet by mouth every other day  ARANESP (ALBUMIN FREE) 200 MCG/ML  SOLN (DARBEPOETIN ALFA-POLYSORBATE) Take every 2 weeks FUROSEMIDE 40 MG  TABS (FUROSEMIDE) Take 2-3 tablets by mouth at bedtime OXYCODONE-ACETAMINOPHEN 10-650 MG  TABS (OXYCODONE-ACETAMINOPHEN) Take 1 tablet by mouth as needed AMBIEN 10 MG  TABS (ZOLPIDEM TARTRATE) Take 1 tablet by mouth at bedtime as needed TUMS E-X 750 750 MG  CHEW (CALCIUM CARBONATE ANTACID) Take 1-2 tablets by mouth as needed VITAMIN C 500 MG  CHEW (ASCORBIC ACID) Take 1 chew by mouth every morning ZYRTEC ALLERGY 10 MG  TABS (CETIRIZINE HCL) Take 1 tablet once daily as needed MUCINEX DM 30-600 MG  XR12H-TAB (DEXTROMETHORPHAN-GUAIFENESIN) Take 1 tablet by mouth as needed LUNESTA 3 MG  TABS (ESZOPICLONE) Take 1 tablet by mouth at bedtime as needed VALTREX 500 MG  TABS (VALACYCLOVIR HCL) Take 1 tablet by mouth two times a day every 3rd day BENADRYL 25 MG  CAPS (DIPHENHYDRAMINE HCL) Take 1 tablet by mouth as needed  Current Allergies (reviewed today): No known allergies   Past Medical History:    chronic anemia    Tachycardia    Chronic headaches    Chronic kidney disease status post 3 liver transplants the last was in 2005.    Depression    Blood clots    Glaucoma    Hyperlipidemia    Hypertension    Obesity  Past Surgical History:  Appendectomy    eyes surgery,    Status post 3 liver transplants most recent 2005.   Family History:    No FH of Colon Cancer:  Social History:    single, no children, nonsmoker, nondrinker, drinks 2-3 caffeinated beverages a day.    Review of Systems       Pertinent positive and negative review of systems were noted in the above HPI and GI specific review of systems.  All other review of systems was otherwise negative.    Vital Signs:  Patient Profile:   45 Years Old Female Height:     59 inches Weight:      133.38 pounds Pulse rate:   84 / minute Pulse rhythm:   regular BP sitting:   110 / 58  (left arm)  Vitals Entered By:  Seibert (August 24, 2007 9:23 AM)                  Physical Exam  Constitutional: generally well appearing Psychiatric: alert and oriented times 3 Eyes: right I obviously abnormal, status post surgery. Mouth: oropharynx moist, no lesions Neck: supple, no lymphadenopathy Cardiovascular: heart regular rate and rythm Lungs: CTA bilaterally Abdomen: soft, non-tender, non-distended, no obvious ascites, no peritoneal signs, normal bowel sounds Extremities: no lower extremity edema bilaterally Skin: no lesions on visible extremities     Impression & Recommendations:  Problem # 1:  NAUSEA AND VOMITING (ICD-787.01) she is on multiple medicines and although no than had been changed perhaps some are contributing to her current nausea and vomiting. She had an acute gastroenteritis in March and there were several neighbors had the same illness. Hers was associated with nausea vomiting and diarrhea. Ever since then her stomach is just not been right. 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 Although she has been nauseous, she has not been taking any of her antinausea medicines as she cannot find them in the  boxes of her recent move.  it does sound like her nausea and vomiting is slowly improving and this is typical for postinfectious gastroparesis. I have recommended she restart taking her Phenergan one pill just before she goes to bed. I will also arrange for her to have an EGD performed at her soonest convenience. She should stay on the Protonix for now. )  Orders: TLB-CBC Platelet - w/Differential (85025-CBCD) TLB-CMP (Comprehensive Metabolic Pnl) (0000000) Urine Pregnancy Test  [id number])    Patient Instructions: 1)  Take phenergan pill before you go to bed each night. 2)  You will be scheduled to have an uppper endoscopy at [facility] next week. 3)  You will get lab test(s) done (CBC, CMET, pregnancy test). 4)  A copy of this information will be sent to Dr.  Eino Farber    Prescriptions: PROMETHAZINE HCL 25 MG  SUPP (PROMETHAZINE HCL) take one pill every night before bedtime  #30 x 5   Entered and Authorized by:   Milus Banister MD   Signed by:   Milus Banister MD on 08/24/2007   Method used:   Electronically sent to ...       [facility] [address] [phone]*       [place].[address]       [place], [place]  [address]       Ph: [phone] or [phone]       Fax: [phone]   RxID:   [id number]  ]  Appended  Document: Orders Update/EGD    Clinical Lists Changes  Orders: Added new Test order of ZEGD (ZEGD) - Signed

## 2010-03-04 NOTE — Assessment & Plan Note (Signed)
GI problem list: 1. Chronic nausea, vomiting. EGD August 2009 was normal except for small hiatal hernia. Status post kidney transplant on immunosuppressive medicines which could cause nausea, narcotics periodically.  Normal complete metabolic profile, slight anemia, labs July 2009.  Changing her Myfortic to Imuran helped nausea temporarily, symptoms returned.  GES 03/2008 slightly slow emptying.  [name] 2/201normal GB, normal bilary tree.  Hospitalized 03/2008 [facility] MD put her on reglan 5mg  three times a day, GI was not consulted.  March, 2010 combination of daily scheduled Zofran and t.i.d. Reglan 5 mg a day has been helpful. She was cut back to Reglan as needed to due concern for potential side effects.   History of Present Illness Visit Type: Follow-up Visit Primary GI MD: Owens Loffler MD Primary Provider: Donato Heinz, MD Requesting Provider: n/a Chief Complaint: Want to discuss having a H[place] scan History of Present Illness:     pleasant 45 year old woman whom I last saw about 6 months ago. She was back in hosp about 2 months ago, with nausea vomitting.  Spent 2-3 days.  She believes she had gallbladder infection and this affected her kidneys.    Was put on antibiotics for the gallbladder infection.  She did not require surgery.  reviewing her discharge summary, there is NO mention of gallbladder infection but may have had UTI or pneumonia.  Since discharge from the hospital she's done well since monday.  She vomitted one time on Monday, non-bloody.  No abd pains.             Current Medications (verified): 1)  Prograf 1 Mg  Caps (Tacrolimus) .... Take 2 Tablets By Mouth At Bedtime 2)  Prednisone 5 Mg  Tabs (Prednisone) .... Take 1 Tablet By Mouth Every Morning 3)  Aspirin 81 Mg  Tbec (Aspirin) .... Take 1 Tablet By Mouth Once A Day 4)  Omeprazole 40 Mg  Cpdr (Omeprazole) .... One Tablet By Mouth Two Times A Day 5)  Zincate 220 Mg  Caps (Zinc Sulfate) .... Take 1 Tablet By  Mouth Once Daily 6)  Procrit 10000 Unit/ml Soln (Epoetin Alfa) .... Take 30000 Total 7)  Furosemide 40 Mg  Tabs (Furosemide) .[name].. 1 Tablet By Mouth At Bedtime Up To 2-3 As Needed 8)  Oxycodone-Acetaminophen 10-650 Mg  Tabs (Oxycodone-Acetaminophen) .... Take 1 Tablet By Mouth As Needed 9)  Ambien 10 Mg  Tabs (Zolpidem Tartrate) .... Take 1 Tablet By Mouth At Bedtime As Needed 10)  Tums E-X 750 750 Mg  Chew (Calcium Carbonate Antacid) .... Take 1-2 Tablets By Mouth As Needed 11)  Vitamin C 500 Mg  Chew (Ascorbic Acid) .... Take 1 Chew By Mouth Every Morning 12)  Zyrtec Allergy 10 Mg  Tabs (Cetirizine Hcl) .... Take 1 Tablet Once Daily As Needed 13)  Mucinex Dm 30-600 Mg  Xr12h-Tab (Dextromethorphan-Guaifenesin) .... Take 1 Tablet By Mouth As Needed 14)  Lunesta 3 Mg  Tabs (Eszopiclone) .... Take 1 Tablet By Mouth At Bedtime As Needed 15)  Valtrex 500 Mg  Tabs (Valacyclovir Hcl) .... Take 1 Tablet By Mouth Two Times A Day Every 3rd Day 16)  Imuran 50 Mg Tabs (Azathioprine) .... 2 Tablets By Mouth Once Daily 17)  Zofran 4 Mg  Tabs (Ondansetron Hcl) .... Take One Pill Every Morning 18)  Promethazine Hcl 25 Mg  Supp (Promethazine Hcl) .... Take One To Two Suppositories Every 5-6 Hours When Vomitting Starts 19)  Reglan 5 Mg Tabs (Metoclopramide Hcl) .[name].. 1 Pill, Up To Three Times A  Day As Needed 20)  Zofran Odt 8 Mg Tbdp (Ondansetron) .... Disolve One Tablet in Mouth Every 4-6 Hours As Needed...[facility]  Allergies (verified): No Known Drug Allergies  Vital Signs:  Patient profile:   45 year old female Height:      59 inches Weight:      112 pounds BMI:     22.70 Pulse rate:   62 / minute Pulse rhythm:   regular BP sitting:   80 / 52  (left arm) Cuff size:   regular  Vitals Entered By: Bernita Buffy CMA ([place]) (October 03, 2008 2:18 PM)  Physical Exam  Additional Exam:  Constitutional: generally well appearing Psychiatric: alert and oriented times 3 Abdomen: soft,  non-tender, non-distended, normal bowel sounds    Impression & Recommendations:  Problem # 1:  intermittent nausea vomiting her recent hospital admission 6 weeks ago was from unclear etiology. She really describes right-sided lower upper abdominal pains as well as chest pains in right sided back pains. Imaging was not clear to help out to find the source. Her discharge summary suggest she had a UTI and probably a pneumonia. Those symptoms have definitely improved. She does have baseline nausea or vomiting possibly from her antirejection medicines possibly from baseline underlying gastroparesis. She will continue taking Zofran daily and Reglan as needed.  Patient Instructions: 1)  Continue taking your current antinausea meds (zofran once daily) and reglan 5mg  pills as needed (up to three times a day). 2)  A copy of this information will be sent to Dr. Rica Records. 3)  The medication list was reviewed and reconciled.  All changed / newly prescribed medications were explained.  A complete medication list was provided to the patient / caregiver. Prescriptions: ZOFRAN ODT 8 MG TBDP (ONDANSETRON) take one a day  #30 x 11   Entered and Authorized by:   Milus Banister MD   Signed by:   Milus Banister MD on 10/03/2008   Method used:   [facility]        [facility] [address] [phone]* (retail)       [address]       [place], [place]  [address]       Ph: [phone] or [phone]       Fax: [phone]   RxID:   [id number]

## 2010-03-04 NOTE — Letter (Signed)
Summary: Patient Note/[facility]  Patient Note/[facility]   Imported By: Phillis Knack 03/08/2008 13:46:00  _____________________________________________________________________  External Attachment:    Type:   Image     Comment:   External Document

## 2010-03-04 NOTE — Progress Notes (Signed)
Summary: F-UP EGD  Phone Note Call from Patient Call back at Home Phone [phone]   Caller: Patient Call For: Gean Laursen Reason for Call: Talk to Nurse Details for Reason: f-up  egd  Summary of Call: pt has EGD yesterday was told by Dr that it did not go well. Pt would like to know what the next step will be Initial call taken by: Quenton Fetter [facility],  September 02, 2007 10:27 AM  Follow-up for Phone Call        09-02-07 Called and scheduled pt for EGD @ [facility] on 09-29-07 @ 9:30AM to arrive @ 8AM.  Mailed endo prep instructions to pt.  [place]

## 2010-03-04 NOTE — Assessment & Plan Note (Signed)
GI problem list: 1. Chronic nausea, vomiting. EGD August 2009 was normal except for small hiatal hernia. Status post kidney transplant on immunosuppressive medicines which could cause nausea, narcotics periodically.  Normal complete metabolic profile, slight anemia, labs July 2009.  Changing her Myfortic to Imuran helped nausea temporarily, symptoms returned.  GES 03/2008 slightly slow emptying.  [name] 2/201normal GB, normal bilary tree.  Hospitalized 03/2008 [facility] MD put her on reglan 5mg  three times a day, GI was not consulted.  March, 2010 combination of daily scheduled Zofran and t.i.d. Reglan 5 mg a day has been helpful. She was cut back to Reglan as needed to due concern for potential side effects.   History of Present Illness Visit Type: Follow-up Visit Primary GI MD: Owens Loffler MD Primary Provider: Donato Heinz, MD Requesting Provider: n/a Chief Complaint: follow up History of Present Illness:     she has seen gynecologist since we recommended her to.  She is planning to do that however.    Nausea under good control, was hospitalized nausea, vomiting for 5 days about 2 weeks ago.  While she was there, she was started on reglan, 5mg  three times a day.  She still take am zofran every morning.  No phenergan.  ON this regmine she's done well so far.             Current Medications (verified): 1)  Prograf 1 Mg  Caps (Tacrolimus) .... Take 2 Tablets By Mouth At Bedtime 2)  Prednisone 5 Mg  Tabs (Prednisone) .... Take 1 Tablet By Mouth Every Morning 3)  Aspirin 81 Mg  Tbec (Aspirin) .... Take 1 Tablet By Mouth Once A Day 4)  Omeprazole 40 Mg  Cpdr (Omeprazole) .... One Tablet By Mouth Two Times A Day 5)  Zincate 220 Mg  Caps (Zinc Sulfate) .... Take 1 Tablet By Mouth Once Daily 6)  Aranesp (Albumin Free) 200 Mcg/ml  Soln (Darbepoetin Alfa-Polysorbate) .... Take Every 2 Weeks 7)  Furosemide 40 Mg  Tabs (Furosemide) .[name].. 1 Tablet By Mouth At Bedtime Up To 2-3 As Needed 8)   Oxycodone-Acetaminophen 10-650 Mg  Tabs (Oxycodone-Acetaminophen) .... Take 1 Tablet By Mouth As Needed 9)  Ambien 10 Mg  Tabs (Zolpidem Tartrate) .... Take 1 Tablet By Mouth At Bedtime As Needed 10)  Tums E-X 750 750 Mg  Chew (Calcium Carbonate Antacid) .... Take 1-2 Tablets By Mouth As Needed 11)  Vitamin C 500 Mg  Chew (Ascorbic Acid) .... Take 1 Chew By Mouth Every Morning 12)  Zyrtec Allergy 10 Mg  Tabs (Cetirizine Hcl) .... Take 1 Tablet Once Daily As Needed 13)  Mucinex Dm 30-600 Mg  Xr12h-Tab (Dextromethorphan-Guaifenesin) .... Take 1 Tablet By Mouth As Needed 14)  Lunesta 3 Mg  Tabs (Eszopiclone) .... Take 1 Tablet By Mouth At Bedtime As Needed 15)  Valtrex 500 Mg  Tabs (Valacyclovir Hcl) .... Take 1 Tablet By Mouth Two Times A Day Every 3rd Day 16)  Imuran 50 Mg Tabs (Azathioprine) .... 2 Tablets By Mouth Once Daily 17)  Zofran 4 Mg  Tabs (Ondansetron Hcl) .... Take One Pill Every Morning 18)  Promethazine Hcl 25 Mg  Supp (Promethazine Hcl) .... Take One To Two Suppositories Every 5-6 Hours When Vomitting Starts 19)  Reglan 5 Mg Tabs (Metoclopramide Hcl) .[name].. 1 Tablet Three Times A Day  Allergies (verified): No Known Drug Allergies  Vital Signs:  Patient profile:   45 year old female Height:      59 inches Weight:  123 pounds BMI:     24.93 BSA:     1.50 Pulse rate:   60 / minute Pulse rhythm:   regular BP sitting:   80 / 52  (left arm)  Vitals Entered By: Genella Mech CMA (April 24, 2008 10:03 AM)  Physical Exam  Additional Exam:  Constitutional: generally well appearing Psychiatric: alert and oriented times 3 Abdomen: soft, non-tender, non-distended, normal bowel sounds    Impression & Recommendations:  Problem # 1:  GASTROPARESIS (ICD-536.3) On Reglan t.i.d. and Zofran once daily, her gastroparesis symptoms seem to be under fairly good control. I explained to her that there are potential side effects with Reglan including tardive dyskinesia, lipsmacking  that can be permanent. This is usually a for patients are on higher doses for long periods of time, we agree that since it is a helpful medicine for her but she will continue to take it but at low dose, and only p.r.n. She will return to see me in 3 months time and sooner if needed.  Patient Instructions: 1)  Stay on zofran daily. 2)  Stay on reglan, but change it to 5mg  pills (taking only as needed). 3)  Please schedule a follow-up appointment in 3 months. 4)  Try miralax, one scoop a day for constipation. 5)  A copy of this information will be sent to Dr. Marval Regal. 6)  The medication list was reviewed and reconciled.  All changed / newly prescribed medications were explained.  A complete medication list was provided to the patient / caregiver.

## 2010-03-04 NOTE — Letter (Signed)
Summary: [facility]   Imported By: Phillis Knack 06/14/2009 10:51:31  _____________________________________________________________________  External Attachment:    Type:   Image     Comment:   External Document

## 2010-03-04 NOTE — Procedures (Signed)
Summary: Instructions for Procedure/[facility] (Outpt)  Instructions for Procedure/[facility] (Outpt)   Imported By: Jerrye Noble D'jimraou 08/25/2007 13:24:49  _____________________________________________________________________  External Attachment:    Type:   Image     Comment:   External Document

## 2010-03-04 NOTE — Progress Notes (Signed)
Summary: Megace approval  Phone Note From Pharmacy   [facility] [address] [phone]* Request: Needs authorization from insurer Action Taken: Getting authorization from insurer Summary of Call: Authorization is approved for 1 year starting from todays date for Megace.  I spoke with Jerkala at [phone] Initial call taken by: Christian Mate CMA ([place]),  August 01, 2009 2:50 PM

## 2010-03-04 NOTE — Progress Notes (Signed)
Summary: LAB RESULTS  Phone Note Call from Patient Call back at Home Phone [phone]   Call For: DR Nichoals Heyde Reason for Call: Lab or Test Results Summary of Call: DONE YESTERDAY HERE AT [place]. Initial call taken by: Irwin Brakeman [facility],  August 30, 2007 10:10 AM  Follow-up for Phone Call        her BHCG level is still VERY mildly elevated.  I spoke with an OB doctor and they really doubt that she is pregant but recommended we recheck her BHCG level again in 7-10 days.  I called patient, no answer.  Please call her to let her know about the lab and arrange for repeat blood test in 7-10 days. Follow-up by: Milus Banister MD,  August 30, 2007 12:22 PM  Additional Follow-up for Phone Call Additional follow up Details #1::        08-30-07 Called pt to let her know that her BHCG is still very mildly elevated.  Dr Ardis Hughs suggested we repeat this level in 7-10-days.  I put lab order in [place] for her to come between 8-4 and 8-7.  Pt said she will come then.  [place]

## 2010-03-04 NOTE — Assessment & Plan Note (Signed)
GI problem list: 1. Chronic nausea, vomiting. EGD August 2009 was normal except for small hiatal hernia. Status post kidney transplant on immunosuppressive medicines which could cause nausea, narcotics periodically.  Normal complete metabolic profile, slight anemia, labs July 2009.  Changing her Myfortic to Imuran helped nausea temporarily, symptoms returned.    History of Present Illness Visit Type: follow up Primary GI MD: Owens Loffler MD Primary Provider: Donato Heinz, MD Requesting Provider: n/a Chief Complaint: still having N/V, weight loss History of Present Illness:     still having intermittent nauseas vomitting.  Can last for several days in a row and then feel well for several days.  Was taken off mifortic, changed to imuran.  Was good for several weeks, but symptoms returned.  Went to [facility] nephrology to work up the nauseas, thought it was likely med related after multiple tests.  takes narcotic pain medicines periodically, at least 15 pills in the past one week.           Prior Medications Reviewed Using: Patient Recall  Updated Prior Medication List: PROGRAF 1 MG  CAPS (TACROLIMUS) Take 2 tablets by mouth at bedtime PREDNISONE 5 MG  TABS (PREDNISONE) Take 1 tablet by mouth every morning ASPIRIN 81 MG  TBEC (ASPIRIN) Take 1 tablet by mouth once a day OMEPRAZOLE 40 MG  CPDR (OMEPRAZOLE) one tablet by mouth two times a day ZINCATE 220 MG  CAPS (ZINC SULFATE) Take 1 tablet by mouth once daily ARANESP (ALBUMIN FREE) 200 MCG/ML  SOLN (DARBEPOETIN ALFA-POLYSORBATE) Take every 2 weeks FUROSEMIDE 40 MG  TABS (FUROSEMIDE) Take 2-3 tablets by mouth at bedtime OXYCODONE-ACETAMINOPHEN 10-650 MG  TABS (OXYCODONE-ACETAMINOPHEN) Take 1 tablet by mouth as needed AMBIEN 10 MG  TABS (ZOLPIDEM TARTRATE) Take 1 tablet by mouth at bedtime as needed TUMS E-X 750 750 MG  CHEW (CALCIUM CARBONATE ANTACID) Take 1-2 tablets by mouth as needed VITAMIN C 500 MG  CHEW (ASCORBIC ACID)  Take 1 chew by mouth every morning ZYRTEC ALLERGY 10 MG  TABS (CETIRIZINE HCL) Take 1 tablet once daily as needed MUCINEX DM 30-600 MG  XR12H-TAB (DEXTROMETHORPHAN-GUAIFENESIN) Take 1 tablet by mouth as needed LUNESTA 3 MG  TABS (ESZOPICLONE) Take 1 tablet by mouth at bedtime as needed VALTREX 500 MG  TABS (VALACYCLOVIR HCL) Take 1 tablet by mouth two times a day every 3rd day BENADRYL 25 MG  CAPS (DIPHENHYDRAMINE HCL) Take 1 tablet by mouth as needed PROMETHAZINE HCL 25 MG  SUPP (PROMETHAZINE HCL) take one pill every night before bedtime IMURAN 50 MG TABS (AZATHIOPRINE) 2 tablets by mouth once daily  Current Allergies (reviewed today): No known allergies   Vital Signs:  Patient Profile:   45 Years Old Female Height:     59 inches Weight:      120.50 pounds BMI:     24.43 Pulse rate:   80 / minute Pulse rhythm:   regular BP sitting:   98 / 52  (left arm) Cuff size:   regular  Vitals Entered By: Marlon Pel CMA (March 21, 2008 11:05 AM)                  Physical Exam  Constitutional: generally well appearing Psychiatric: alert and oriented times 3 Abdomen: soft, non-tender, non-distended, normal bowel sounds    Impression & Recommendations:  Problem # 1:  NAUSEA AND VOMITING (ICD-787.01) still unclear cause. Certainly intermittent narcotics did not help matters. #3 side effect of Lasixh is nausea.I will arrange for her to have an  abdominal ultrasound performed, biliary disease can oftentimes cause nausea without specific significant abdominal pains. She will also be arranged for a gastric emptying scan check for gastroparesis. I advised her to stay away from narcotics as best as possible for least 2 days prior to that can cause false positive results. I will give her prescription for Zofran 4 mg to be taken every morning as well as p.r.n. suppositories Phenergan.  will also recheck basic labs including CBC, complete metabolic profile, sedimentation  rate.  Orders: Ultrasound Abdomen (UAS) Gastric Emptying Scan (GES) TLB-CBC Platelet - w/Differential (85025-CBCD) TLB-CMP (Comprehensive Metabolic Pnl) (0000000) TLB-Sedimentation Rate (ESR) (85652-ESR)    Patient Instructions: 1)  You will be scheduled for an ultrasound for intermittent nausea, check for gallstones? 2)  Try to avoid any narcotic medicines (these can cause nausea/vomitting). 3)  Gastric emptying scan (you must avoid all narcotics for 48 hours prior to this test). 4)  Phenergan suppositories called in.  Take one of these when vomitting starts. 5)  A copy of this information will be sent to Dr. Larose Hires. 6)  You will get lab test(s) done today (cbc, cmet, esr) 7)  Will start zofran, 4mg  pill once a day every AM.  Prescriptions: PROMETHAZINE HCL 25 MG  SUPP (PROMETHAZINE HCL) take one to two suppositories every 5-6 hours when vomitting starts  #60 x 3   Entered and Authorized by:   Milus Banister MD   Signed by:   Milus Banister MD on 03/21/2008   Method used:   [facility] to        [facility] [address] [phone]* (retail)       [address]       [place], [place]  [address]       Ph: [phone] or [phone]       Fax: [phone]   RxID:   [id number] ZOFRAN 4 MG  TABS (ONDANSETRON HCL) take one pill every morning  #30 x 6   Entered and Authorized by:   Milus Banister MD   Signed by:   Milus Banister MD on 03/21/2008   Method used:   [facility]ically to        [facility] [address] [phone]* (retail)       [address]       [place], [place]  [address]       Ph: [phone] or [phone]       Fax: [phone]   RxID:   [id number]

## 2010-03-04 NOTE — Procedures (Signed)
Summary: EGD   EGD  Procedure date:  09/29/2007  Findings:      Location: [facility]    Patient Name: Tina Watkins, Tina Watkins MRN:  Procedure Procedures: Panendoscopy (EGD) CPT: A5739879.  Personnel: Endoscopist: Milus Banister, MD.  Exam Location: Exam performed in Endoscopy Suite. Outpatient  Patient Consent: Procedure, Alternatives, Risks and Benefits discussed, consent obtained, from patient. Consent was obtained by the RN.  Indications Symptoms: Nausea. Vomiting.  History  Current Medications: Patient is not currently taking Coumadin.  Comments: Patient history reviewed/updated, physical exam performed prior to initiation of sedation? yes Pre-Exam Physical: Performed Sep 29, 2007  Cardio-pulmonary exam, Abdominal exam, Mental status exam WNL.  Comments: Pt. history reviewed/updated, physical exam performed prior to initiation of sedation? yes Exam Exam Info: Maximum depth of insertion Duodenum, intended Duodenum. Gastric retroflexion performed. Images taken. ASA Classification: III. Tolerance: good.  Sedation Meds: Patient assessed and found to be appropriate for deep sedation. Monitored Anesthesia Care.  Monitoring: BP and pulse monitoring done. Oximetry used. Supplemental O2 given  Findings - Normal: Proximal Esophagus to Duodenal 2nd Portion. Comments: OTHERWISE NORMAL EXAMINATION.  HIATAL HERNIA: 2 cms. in length.   Assessment: SMALL HIATAL HERNIA THAT MAY PREDISPOSE HER TO HAVING GERD.  SHE SHOULD CONTINUE ON ONCE DAILY PPI (OMEPRAZOLE 40MG  DAILY), ALTHOUGH IT IS BEST IF SHE TAKES THIS 20-30MIN PRIOR TO A DECENT BREAKFAST MEAL AS THAT IS THE WAY THE PILL IS DESIGNED TO WORK MOST EFFECTIVELY.  MOST COMMON SIDE EFFECT OF VALTREX IS NAUSEA, THAT MAY CERTAINLY BE CONTRIBUTING TO HER NAUSEA.  SHE IS NOT CLEAR WHY SHE TAKES IT, IT MAY BE PROPHYLACTIC FOLLOWING ONE OF HER 3 KIDNEY TRANSPLANTS.  I WILL LEAVE IT TO HER RENAL DOCTOR TO CONSIDER HOLDING  IT OR STOPPING IT ALTOGETHER.

## 2010-03-04 NOTE — Assessment & Plan Note (Signed)
GI problem list: 1. Chronic nausea, vomiting. EGD August 2009 was normal except for small hiatal hernia. Status post kidney transplant on immunosuppressive medicines which could cause nausea, narcotics periodically.  Normal complete metabolic profile, slight anemia, labs July 2009.  Changing her Myfortic to Imuran helped nausea temporarily, symptoms returned.  GES 03/2008 slightly slow emptying.  [name] 2/201normal GB, normal bilary tree.  Hospitalized 03/2008 [facility] MD put her on reglan 5mg  three times a day, GI was not consulted.  March, 2010 combination of daily scheduled Zofran and t.i.d. Reglan 5 mg a day has been helpful. She was cut back to Reglan as needed to due concern for potential side effects.   History of Present Illness Visit Type: Follow-up Visit Primary GI MD: Owens Loffler MD Primary Provider: Donato Heinz, MD Requesting Provider: n/a Chief Complaint: follow up and refill History of Present Illness:     45 year old woman whom I last saw September of 2010. She still has trouble with nausea, but lately it's been pretty good overall.  Nauseas at times.  She eats zero to one meal a day only. She does not get pyrosis.  She does not take NSAIDs ever.    She rarely takes oxycodone (maybe at worst 2 pills per week).  She still takes reglan but not three times a day, round the clock like she was in the past.  She will usually take 1 reglan in a week. Takes zofran 2-3 a week.  Doesn't really take the phenergan suppositories.  She asked about starting Marinol. She thinks it may help with nausea and also boost her appetite. She would like to stop smoking marijuana and thinks it may help as a substitute for that as well.             Current Medications (verified): 1)  Prograf 1 Mg  Caps (Tacrolimus) .... Take 2 Tablets By Mouth At Bedtime 2)  Prednisone 5 Mg  Tabs (Prednisone) .... Take 1 Tablet By Mouth Every Morning 3)  Aspirin 81 Mg  Tbec (Aspirin) .... Take 1 Tablet By Mouth  Once A Day 4)  Omeprazole 40 Mg  Cpdr (Omeprazole) .... One Tablet By Mouth Two Times A Day 5)  Zincate 220 Mg  Caps (Zinc Sulfate) .... Take 1 Tablet By Mouth Once Daily 6)  Procrit 10000 Unit/ml Soln (Epoetin Alfa) .... Take 30000 Total 7)  Furosemide 40 Mg  Tabs (Furosemide) .[name].. 1 Tablet By Mouth At Bedtime Up To 2-3 As Needed 8)  Oxycodone-Acetaminophen 10-650 Mg  Tabs (Oxycodone-Acetaminophen) .... Take 1 Tablet By Mouth As Needed 9)  Ambien 10 Mg  Tabs (Zolpidem Tartrate) .... Take 1 Tablet By Mouth At Bedtime As Needed 10)  Tums E-X 750 750 Mg  Chew (Calcium Carbonate Antacid) .... Take 1-2 Tablets By Mouth As Needed 11)  Zyrtec Allergy 10 Mg  Tabs (Cetirizine Hcl) .... Take 1 Tablet Once Daily As Needed 12)  Mucinex Dm 30-600 Mg  Xr12h-Tab (Dextromethorphan-Guaifenesin) .... Take 1 Tablet By Mouth As Needed 13)  Lunesta 3 Mg  Tabs (Eszopiclone) .... Take 1 Tablet By Mouth At Bedtime As Needed 14)  Imuran 50 Mg Tabs (Azathioprine) .... 2 Tablets By Mouth Once Daily 15)  Zofran 4 Mg  Tabs (Ondansetron Hcl) .... Take One Pill Every Morning 16)  Promethazine Hcl 25 Mg  Supp (Promethazine Hcl) .... Take One To Two Suppositories Every 5-6 Hours When Vomitting Starts 17)  Reglan 5 Mg Tabs (Metoclopramide Hcl) .[name].. 1 Pill, Up To Three Times A  Day As Needed 18)  Zofran Odt 8 Mg Tbdp (Ondansetron) .... Take One A Day As Needed  Allergies (verified): No Known Drug Allergies  Vital Signs:  Patient profile:   45 year old female Height:      59 inches Weight:      112 pounds BMI:     22.70 BSA:     1.44 Pulse rate:   54 / minute Pulse rhythm:   regular BP sitting:   86 / 58  (left arm)  Vitals Entered By: [employer] Deborra Medina) (July 26, 2009 3:39 PM)  Physical Exam  Additional Exam:  Constitutional: generally well appearing Psychiatric: alert and oriented times 3 Abdomen: soft, non-tender, non-distended, normal bowel sounds    Impression & Recommendations:  Problem # 1:   chronic nausea vomiting, gastroparesis her weight is stable over the past 10 months, 112 pounds on our scale.  She is still bothered by nausea periodically but this is much better than she has been in the past. She is only taking antinausea medicines on a p.r.n. basis throughout the week. I recommended she change the dosing of her Reglan so that she takes it every night before bedtime to try to empty out her stomach, start the morning with a empty stomach. She eats one meal a day and this is never a good idea for people with gastroparesis. Smaller more frequent meals will ask her stomach less and she can hopefully get overall more calories in her as well. She really feels like she needs to gain weight.  she inquired about trying Marinol for appetite stimulant and admitted that she wants to try to use it to stop smoking marijuana as well.   that doesn't really sit right with me. I would prefer to use Megace which is a much more standard appetite suppressant instead. She will return to see me in 7-8 weeks and sooner if needed.  Patient Instructions: 1)  Please start taking reglan 1-2 pills at bedtime every night. 2)  Try smaller more frequent meals (3-4 meals a day rather than one meal a day). 3)  Will prescribe megace 625mg  (37ml) take one dose daily. 4)  Return to see Dr. Ardis Hughs in 7-8 weeks. 5)  A copy of this information will be sent to Dr. Marval Regal. 6)  The medication list was reviewed and reconciled.  All changed / newly prescribed medications were explained.  A complete medication list was provided to the patient / caregiver. Prescriptions: MEGACE ES 625 MG/5ML SUSP (MEGESTROL ACETATE) take 27ml once daily  #1 month x 3   Entered and Authorized by:   Milus Banister MD   Signed by:   Milus Banister MD on 07/26/2009   Method used:   [facility]        [facility] [address] [phone]* (retail)       [address]       [place], [place]  [address]       Ph:  [phone]       Fax: [phone]   RxID:   [id number]

## 2010-03-04 NOTE — Procedures (Signed)
Summary: aborted EGD   EGD  Procedure date:  09/01/2007  Findings:      Location: [facility]    Patient Name: Tina Watkins, Tina Watkins MRN:  Procedure Procedures: aborted EGD.  Personnel: Endoscopist: Milus Banister, MD.  Exam Location: Exam performed in Endoscopy Suite.  Patient Consent: Procedure, Alternatives, Risks and Benefits discussed, consent obtained, from patient. Consent was obtained by the RN.  Indications Symptoms: Nausea. Vomiting.  History  Current Medications: Patient is not currently taking Coumadin.  Comments: Patient history reviewed/updated, physical exam performed prior to initiation of sedation? yes Pre-Exam Physical: Performed Sep 01, 2007  Cardio-pulmonary exam, Abdominal exam, Mental status exam WNL.  Comments: Pt. history reviewed/updated, physical exam performed prior to initiation of sedation? yes Exam Exam Info: Maximum depth of insertion Esophagus, intended Duodenum. Patient position: on left side. ASA Classification: III. Tolerance: poor.  Sedation Meds: Patient assessed and found to be appropriate for moderate (conscious) sedation. Fentanyl 50 mcg. given IV. Versed 6 mg. given IV. Promethazine 25 mg given IV.  Monitoring: BP and pulse monitoring done. Oximetry used. Supplemental O2 given  Findings OTHER FINDING: in Proximal Esophagus. Comments: She was combative, unsedatable and so after three attempts to intubate her esophagus, the examination was stopped.     Assessment: Examination aborted.  She was combative, pulling at things and sitting upright.  She may have been having a paradoxical reaction to the sedatives.  Will arrange for repeat EGD to be attempted using anesthesia assistence for sedation (MAC).   Appended Document: aborted EGD please arrange for EGD on next available EUS thursday.  Will need to be done with propofol.  Appended Document: aborted EGD 09-02-07 Scheduled @ [facility] for 09-29-07 @ 9:30AM to arrive  @ 8AM.  EGD with propofol.  Pt informed and mailed her endo prep instructions.[place]

## 2010-03-07 ENCOUNTER — Other Ambulatory Visit: Payer: Self-pay | Admitting: Nephrology

## 2010-03-07 ENCOUNTER — Ambulatory Visit (HOSPITAL_COMMUNITY): Payer: Medicaid Other | Attending: Nephrology

## 2010-03-07 DIAGNOSIS — N183 Chronic kidney disease, stage 3 unspecified: Secondary | ICD-10-CM | POA: Insufficient documentation

## 2010-03-07 DIAGNOSIS — D638 Anemia in other chronic diseases classified elsewhere: Secondary | ICD-10-CM | POA: Insufficient documentation

## 2010-03-07 LAB — IRON AND TIBC
Iron: 105 ug/dL (ref 42–135)
Saturation Ratios: 46 % (ref 20–55)
TIBC: 229 ug/dL — ABNORMAL LOW (ref 250–470)
UIBC: 124 ug/dL

## 2010-03-08 LAB — FERRITIN: Ferritin: 1653 ng/mL — ABNORMAL HIGH (ref 10–291)

## 2010-03-10 LAB — POCT HEMOGLOBIN-HEMACUE: Hemoglobin: 10.9 g/dL — ABNORMAL LOW (ref 12.0–15.0)

## 2010-03-26 IMAGING — CT CT MAXILLOFACIAL W/O CM
2 series · 16 of 40 positions shown, 20 images · non-contrast
Comparison: None

CLINICAL DATA: Fever.  Pain, swelling.  Rule out abscess.  The
patient had a left mandibular tooth extraction 4 days ago.

CT MAXILLOFACIAL WITHOUT CONTRAST
TECHNIQUE: Multidetector CT imaging of the maxillofacial
structures was performed. Multiplanar CT image reconstructions were
also generated.

[Series 604: coronal soft tissue · coronal · 0.32mm/px · 3 of 61 slices shown]
[im 27/61  bone]
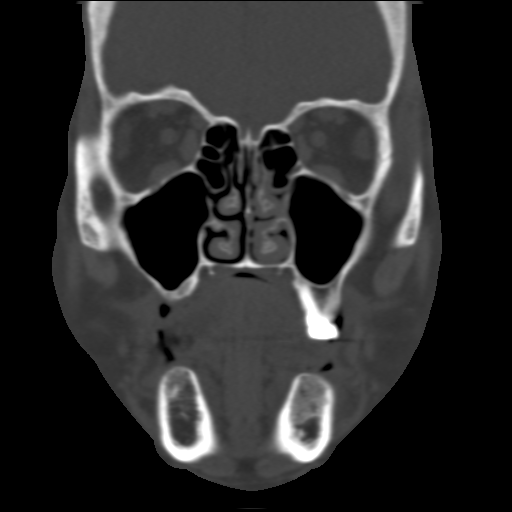
[im 31/61  bone]
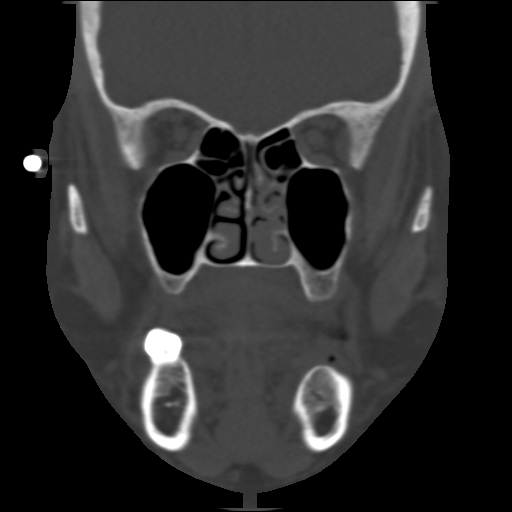
[im 34/61  bone]
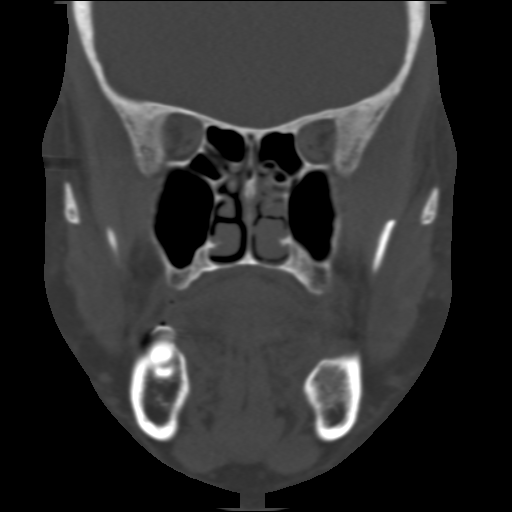

[Series 605: sagittal soft tissue · sagittal · 0.32mm/px · 13 of 75 slices shown, 17 images]
[im 5/75  brain]
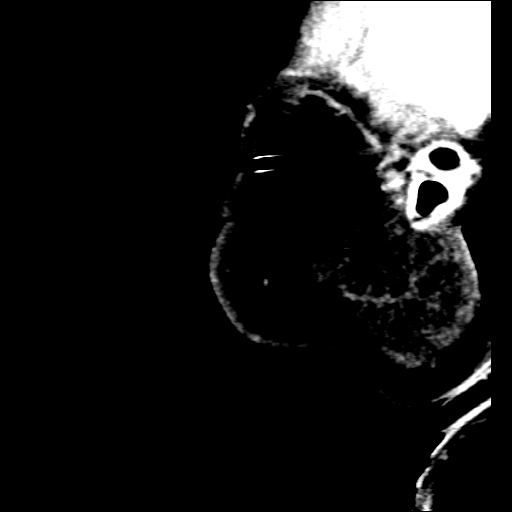
[im 5/75  bone]
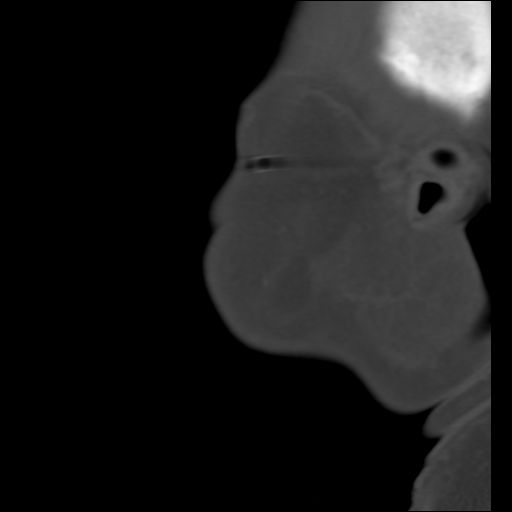
[im 13/75  bone]
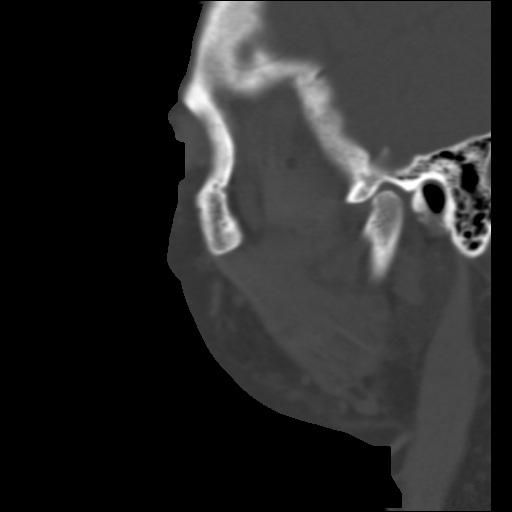
[im 14/75  bone]
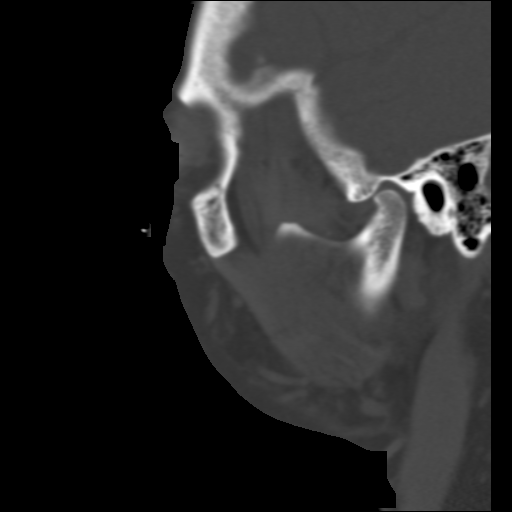
[im 24/75  bone]
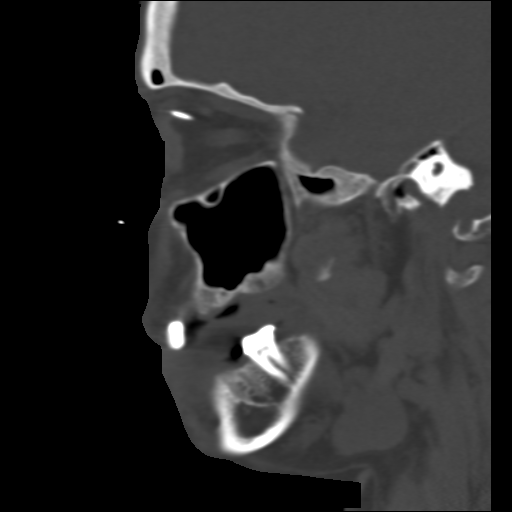
[im 25/75  brain]
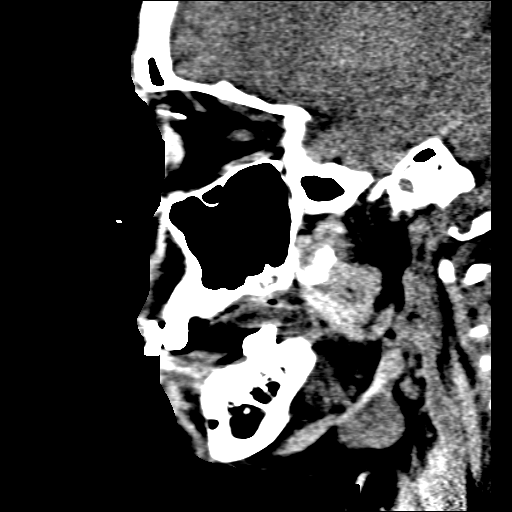
[im 25/75  bone]
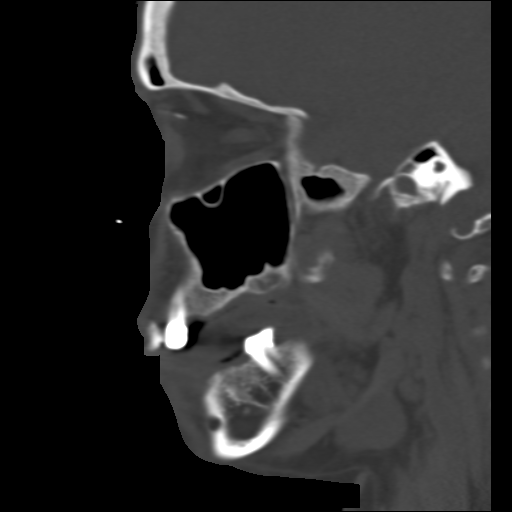
[im 33/75  bone]
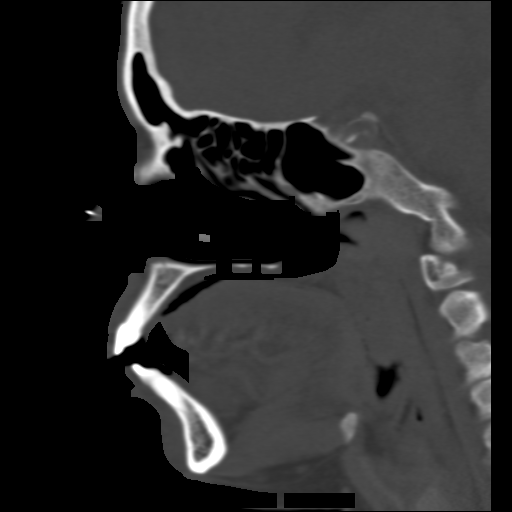
[im 38/75  bone]
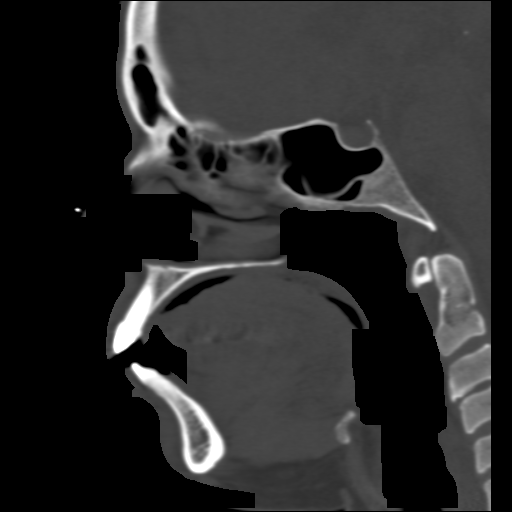
[im 42/75  bone]
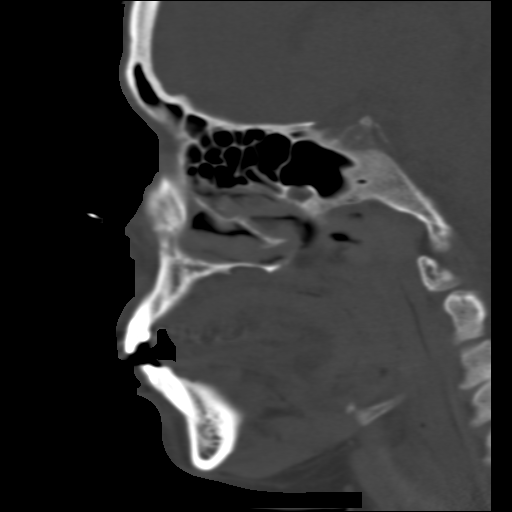
[im 50/75  brain]
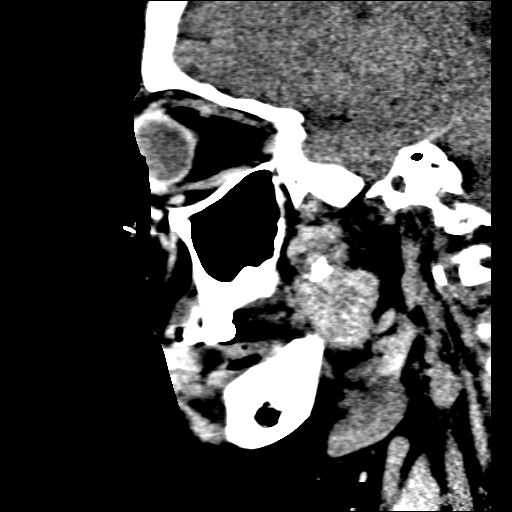
[im 50/75  bone]
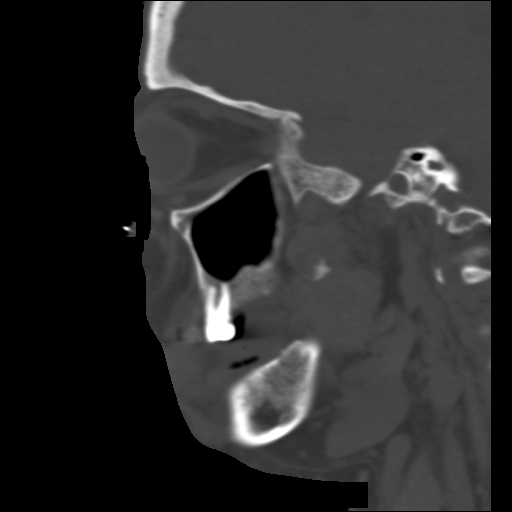
[im 51/75  bone]
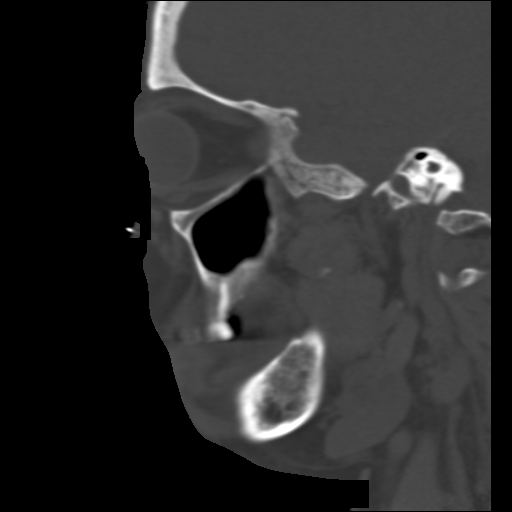
[im 61/75  bone]
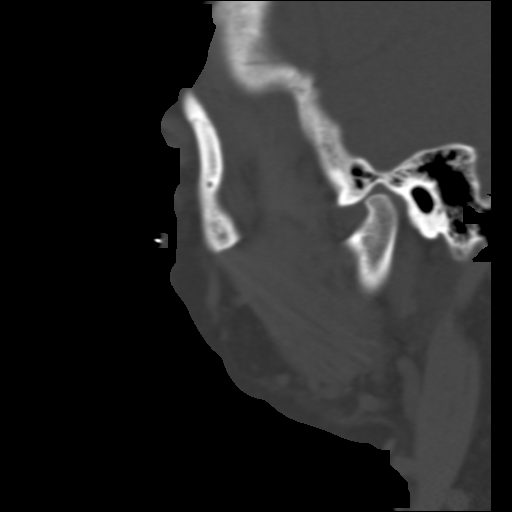
[im 62/75  bone]
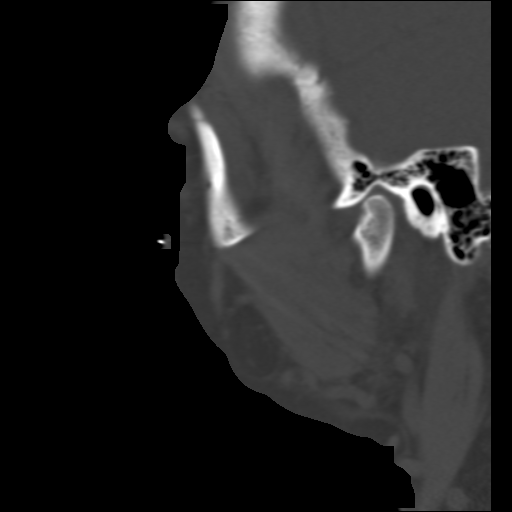
[im 70/75  brain]
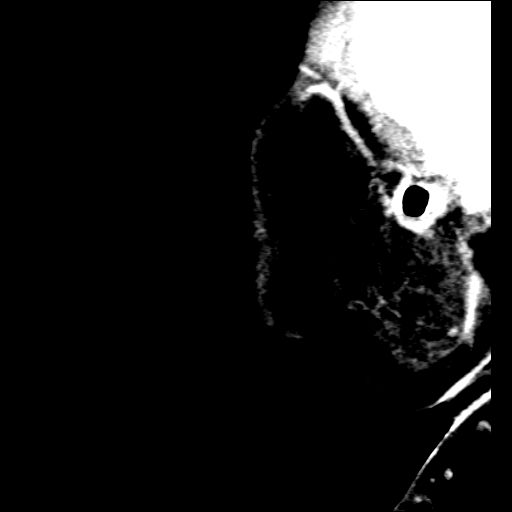
[im 70/75  bone]
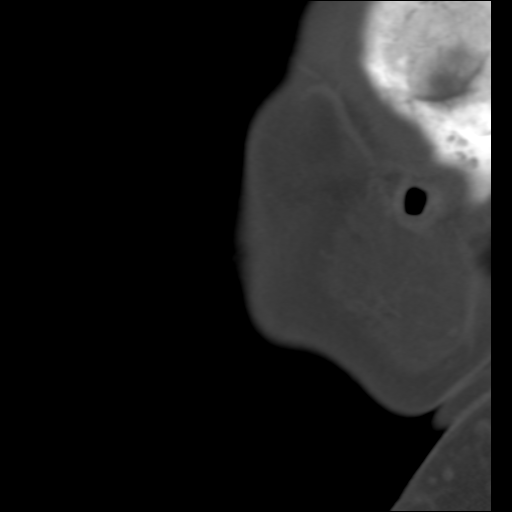

[16 of 40 positions shown; findings below may reference images not displayed]

FINDINGS: Patient has had recent extraction of tooth #21.  There is
lucency at the extraction site.  No definite evidence for abscess
however.  Numerous teeth are absent.  No evidence for sinus
disease.  The patient has a right ocular prosthesis.

The visualized portion of the brain has a normal appearance.
IMPRESSION: Status post extraction of tooth #21.  There is lucency in the
socket following extraction.  No definite evidence for abscess in
this location however.  No overlying soft tissue abnormalities
identified by CT.

## 2010-04-04 ENCOUNTER — Encounter (HOSPITAL_COMMUNITY): Payer: Medicaid Other | Attending: Nephrology

## 2010-04-04 ENCOUNTER — Other Ambulatory Visit: Payer: Self-pay | Admitting: Nephrology

## 2010-04-04 DIAGNOSIS — N183 Chronic kidney disease, stage 3 unspecified: Secondary | ICD-10-CM | POA: Insufficient documentation

## 2010-04-04 DIAGNOSIS — D638 Anemia in other chronic diseases classified elsewhere: Secondary | ICD-10-CM | POA: Insufficient documentation

## 2010-04-04 LAB — IRON AND TIBC
Iron: 99 ug/dL (ref 42–135)
Saturation Ratios: 41 % (ref 20–55)
TIBC: 239 ug/dL — ABNORMAL LOW (ref 250–470)
UIBC: 140 ug/dL

## 2010-04-05 LAB — FERRITIN: Ferritin: 1582 ng/mL — ABNORMAL HIGH (ref 10–291)

## 2010-04-07 LAB — POCT HEMOGLOBIN-HEMACUE: Hemoglobin: 11.7 g/dL — ABNORMAL LOW (ref 12.0–15.0)

## 2010-04-15 LAB — IRON AND TIBC
Iron: 98 ug/dL (ref 42–135)
Saturation Ratios: 47 % (ref 20–55)
TIBC: 210 ug/dL — ABNORMAL LOW (ref 250–470)
UIBC: 112 ug/dL

## 2010-04-15 LAB — POCT HEMOGLOBIN-HEMACUE: Hemoglobin: 10.6 g/dL — ABNORMAL LOW (ref 12.0–15.0)

## 2010-04-15 LAB — FERRITIN: Ferritin: 1541 ng/mL — ABNORMAL HIGH (ref 10–291)

## 2010-04-16 LAB — IRON AND TIBC
Iron: 60 ug/dL (ref 42–135)
Saturation Ratios: 27 % (ref 20–55)
TIBC: 220 ug/dL — ABNORMAL LOW (ref 250–470)
UIBC: 160 ug/dL

## 2010-04-16 LAB — FERRITIN: Ferritin: 1607 ng/mL — ABNORMAL HIGH (ref 10–291)

## 2010-04-16 LAB — POCT HEMOGLOBIN-HEMACUE
Hemoglobin: 10.8 g/dL — ABNORMAL LOW (ref 12.0–15.0)
Hemoglobin: 11 g/dL — ABNORMAL LOW (ref 12.0–15.0)

## 2010-04-17 LAB — IRON AND TIBC
Iron: 63 ug/dL (ref 42–135)
Iron: 101 ug/dL (ref 42–135)
Saturation Ratios: 31 % (ref 20–55)
Saturation Ratios: 44 % (ref 20–55)
TIBC: 205 ug/dL — ABNORMAL LOW (ref 250–470)
TIBC: 227 ug/dL — ABNORMAL LOW (ref 250–470)
UIBC: 142 ug/dL
UIBC: 126 ug/dL

## 2010-04-17 LAB — FERRITIN
Ferritin: 1503 ng/mL — ABNORMAL HIGH (ref 10–291)
Ferritin: 1451 ng/mL — ABNORMAL HIGH (ref 10–291)

## 2010-04-17 LAB — POCT HEMOGLOBIN-HEMACUE
Hemoglobin: 11 g/dL — ABNORMAL LOW (ref 12.0–15.0)
Hemoglobin: 9.8 g/dL — ABNORMAL LOW (ref 12.0–15.0)

## 2010-04-19 LAB — POCT HEMOGLOBIN-HEMACUE: Hemoglobin: 10.8 g/dL — ABNORMAL LOW (ref 12.0–15.0)

## 2010-04-20 LAB — IRON AND TIBC
Iron: 98 ug/dL (ref 42–135)
Saturation Ratios: 44 % (ref 20–55)
TIBC: 221 ug/dL — ABNORMAL LOW (ref 250–470)
UIBC: 123 ug/dL

## 2010-04-20 LAB — FERRITIN: Ferritin: 1647 ng/mL — ABNORMAL HIGH (ref 10–291)

## 2010-04-20 LAB — POCT HEMOGLOBIN-HEMACUE
Hemoglobin: 10.6 g/dL — ABNORMAL LOW (ref 12.0–15.0)
Hemoglobin: 11 g/dL — ABNORMAL LOW (ref 12.0–15.0)

## 2010-04-21 LAB — POCT HEMOGLOBIN-HEMACUE
Hemoglobin: 10.9 g/dL — ABNORMAL LOW (ref 12.0–15.0)
Hemoglobin: 11.3 g/dL — ABNORMAL LOW (ref 12.0–15.0)
Hemoglobin: 11.5 g/dL — ABNORMAL LOW (ref 12.0–15.0)
Hemoglobin: 11.8 g/dL — ABNORMAL LOW (ref 12.0–15.0)

## 2010-04-21 LAB — FERRITIN
Ferritin: 1257 ng/mL — ABNORMAL HIGH (ref 10–291)
Ferritin: 1608 ng/mL — ABNORMAL HIGH (ref 10–291)

## 2010-04-21 LAB — IRON AND TIBC
Iron: 124 ug/dL (ref 42–135)
Iron: 126 ug/dL (ref 42–135)
Saturation Ratios: 54 % (ref 20–55)
Saturation Ratios: 54 % (ref 20–55)
TIBC: 229 ug/dL — ABNORMAL LOW (ref 250–470)
TIBC: 233 ug/dL — ABNORMAL LOW (ref 250–470)
UIBC: 105 ug/dL
UIBC: 107 ug/dL

## 2010-04-22 LAB — IRON AND TIBC
Iron: 85 ug/dL (ref 42–135)
Saturation Ratios: 41 % (ref 20–55)
TIBC: 205 ug/dL — ABNORMAL LOW (ref 250–470)
UIBC: 120 ug/dL

## 2010-04-22 LAB — POCT HEMOGLOBIN-HEMACUE
Hemoglobin: 11.1 g/dL — ABNORMAL LOW (ref 12.0–15.0)
Hemoglobin: 12.5 g/dL (ref 12.0–15.0)

## 2010-04-22 LAB — FERRITIN: Ferritin: 1822 ng/mL — ABNORMAL HIGH (ref 10–291)

## 2010-04-23 LAB — IRON AND TIBC
Iron: 83 ug/dL (ref 42–135)
Saturation Ratios: 41 % (ref 20–55)
TIBC: 204 ug/dL — ABNORMAL LOW (ref 250–470)
UIBC: 121 ug/dL

## 2010-04-23 LAB — POCT HEMOGLOBIN-HEMACUE: Hemoglobin: 9.9 g/dL — ABNORMAL LOW (ref 12.0–15.0)

## 2010-04-25 LAB — IRON AND TIBC
Iron: 97 ug/dL (ref 42–135)
Saturation Ratios: 47 % (ref 20–55)
TIBC: 205 ug/dL — ABNORMAL LOW (ref 250–470)
UIBC: 108 ug/dL

## 2010-04-25 LAB — POCT HEMOGLOBIN-HEMACUE
Hemoglobin: 11 g/dL — ABNORMAL LOW (ref 12.0–15.0)
Hemoglobin: 11.3 g/dL — ABNORMAL LOW (ref 12.0–15.0)

## 2010-04-28 LAB — IRON AND TIBC
Iron: 76 ug/dL (ref 42–135)
Saturation Ratios: 36 % (ref 20–55)
TIBC: 209 ug/dL — ABNORMAL LOW (ref 250–470)
UIBC: 133 ug/dL

## 2010-04-28 LAB — URINALYSIS, ROUTINE W REFLEX MICROSCOPIC
Bilirubin Urine: NEGATIVE
Glucose, UA: NEGATIVE mg/dL
Ketones, ur: NEGATIVE mg/dL
Nitrite: NEGATIVE
Protein, ur: 30 mg/dL — AB
Specific Gravity, Urine: 1.017 (ref 1.005–1.030)
Urobilinogen, UA: 0.2 mg/dL (ref 0.0–1.0)
pH: 6 (ref 5.0–8.0)

## 2010-04-28 LAB — DIFFERENTIAL
Basophils Absolute: 0 10*3/uL (ref 0.0–0.1)
Basophils Relative: 0 % (ref 0–1)
Eosinophils Absolute: 0.1 10*3/uL (ref 0.0–0.7)
Eosinophils Relative: 3 % (ref 0–5)
Lymphocytes Relative: 26 % (ref 12–46)
Lymphs Abs: 1.1 10*3/uL (ref 0.7–4.0)
Monocytes Absolute: 0.3 10*3/uL (ref 0.1–1.0)
Monocytes Relative: 7 % (ref 3–12)
Neutro Abs: 2.8 10*3/uL (ref 1.7–7.7)
Neutrophils Relative %: 64 % (ref 43–77)

## 2010-04-28 LAB — POCT HEMOGLOBIN-HEMACUE
Hemoglobin: 10.9 g/dL — ABNORMAL LOW (ref 12.0–15.0)
Hemoglobin: 10.9 g/dL — ABNORMAL LOW (ref 12.0–15.0)
Hemoglobin: 11.2 g/dL — ABNORMAL LOW (ref 12.0–15.0)
Hemoglobin: 12.1 g/dL (ref 12.0–15.0)

## 2010-04-28 LAB — CBC
HCT: 31.1 % — ABNORMAL LOW (ref 36.0–46.0)
Hemoglobin: 10.8 g/dL — ABNORMAL LOW (ref 12.0–15.0)
MCHC: 34.7 g/dL (ref 30.0–36.0)
MCV: 92.7 fL (ref 78.0–100.0)
Platelets: 161 10*3/uL (ref 150–400)
RBC: 3.36 MIL/uL — ABNORMAL LOW (ref 3.87–5.11)
RDW: 24.4 % — ABNORMAL HIGH (ref 11.5–15.5)
WBC: 4.3 10*3/uL (ref 4.0–10.5)

## 2010-04-28 LAB — POCT I-STAT, CHEM 8
BUN: 20 mg/dL (ref 6–23)
Calcium, Ion: 1.16 mmol/L (ref 1.12–1.32)
Chloride: 107 mEq/L (ref 96–112)
Creatinine, Ser: 1.8 mg/dL — ABNORMAL HIGH (ref 0.4–1.2)
Glucose, Bld: 75 mg/dL (ref 70–99)
HCT: 30 % — ABNORMAL LOW (ref 36.0–46.0)
Hemoglobin: 10.2 g/dL — ABNORMAL LOW (ref 12.0–15.0)
Potassium: 3.3 mEq/L — ABNORMAL LOW (ref 3.5–5.1)
Sodium: 138 mEq/L (ref 135–145)
TCO2: 23 mmol/L (ref 0–100)

## 2010-04-28 LAB — URINE MICROSCOPIC-ADD ON

## 2010-04-28 LAB — PROTIME-INR
INR: 0.97 (ref 0.00–1.49)
Prothrombin Time: 12.8 seconds (ref 11.6–15.2)

## 2010-05-05 LAB — FERRITIN: Ferritin: 1558 ng/mL — ABNORMAL HIGH (ref 10–291)

## 2010-05-05 LAB — IRON AND TIBC
Iron: 102 ug/dL (ref 42–135)
Saturation Ratios: 48 % (ref 20–55)
TIBC: 213 ug/dL — ABNORMAL LOW (ref 250–470)
UIBC: 111 ug/dL

## 2010-05-05 LAB — POCT HEMOGLOBIN-HEMACUE: Hemoglobin: 12 g/dL (ref 12.0–15.0)

## 2010-05-06 LAB — POCT HEMOGLOBIN-HEMACUE: Hemoglobin: 11.9 g/dL — ABNORMAL LOW (ref 12.0–15.0)

## 2010-05-07 LAB — POCT HEMOGLOBIN-HEMACUE
Hemoglobin: 11.3 g/dL — ABNORMAL LOW (ref 12.0–15.0)
Hemoglobin: 11.6 g/dL — ABNORMAL LOW (ref 12.0–15.0)

## 2010-05-07 LAB — IRON AND TIBC
Iron: 99 ug/dL (ref 42–135)
Saturation Ratios: 49 % (ref 20–55)
TIBC: 202 ug/dL — ABNORMAL LOW (ref 250–470)
UIBC: 103 ug/dL

## 2010-05-08 LAB — POCT HEMOGLOBIN-HEMACUE: Hemoglobin: 10 g/dL — ABNORMAL LOW (ref 12.0–15.0)

## 2010-05-09 ENCOUNTER — Other Ambulatory Visit: Payer: Self-pay | Admitting: Nephrology

## 2010-05-09 ENCOUNTER — Encounter (HOSPITAL_COMMUNITY): Payer: Medicaid Other | Attending: Nephrology

## 2010-05-09 DIAGNOSIS — D638 Anemia in other chronic diseases classified elsewhere: Secondary | ICD-10-CM | POA: Insufficient documentation

## 2010-05-09 DIAGNOSIS — N183 Chronic kidney disease, stage 3 unspecified: Secondary | ICD-10-CM | POA: Insufficient documentation

## 2010-05-09 LAB — IRON AND TIBC
Iron: 104 ug/dL (ref 42–135)
Iron: 63 ug/dL (ref 42–135)
Iron: 84 ug/dL (ref 42–135)
Saturation Ratios: 28 % (ref 20–55)
Saturation Ratios: 37 % (ref 20–55)
Saturation Ratios: 48 % (ref 20–55)
TIBC: 216 ug/dL — ABNORMAL LOW (ref 250–470)
TIBC: 225 ug/dL — ABNORMAL LOW (ref 250–470)
TIBC: 226 ug/dL — ABNORMAL LOW (ref 250–470)
UIBC: 112 ug/dL
UIBC: 142 ug/dL
UIBC: 162 ug/dL

## 2010-05-09 LAB — POCT HEMOGLOBIN-HEMACUE
Hemoglobin: 10.4 g/dL — ABNORMAL LOW (ref 12.0–15.0)
Hemoglobin: 11.3 g/dL — ABNORMAL LOW (ref 12.0–15.0)
Hemoglobin: 11.9 g/dL — ABNORMAL LOW (ref 12.0–15.0)
Hemoglobin: 12.7 g/dL (ref 12.0–15.0)

## 2010-05-09 LAB — FERRITIN: Ferritin: 1521 ng/mL — ABNORMAL HIGH (ref 10–291)

## 2010-05-10 LAB — POCT HEMOGLOBIN-HEMACUE: Hemoglobin: 10.8 g/dL — ABNORMAL LOW (ref 12.0–15.0)

## 2010-05-11 LAB — DIFFERENTIAL
Basophils Absolute: 0 10*3/uL (ref 0.0–0.1)
Basophils Absolute: 0 10*3/uL (ref 0.0–0.1)
Basophils Relative: 0 % (ref 0–1)
Basophils Relative: 0 % (ref 0–1)
Eosinophils Absolute: 0.1 10*3/uL (ref 0.0–0.7)
Eosinophils Absolute: 0.1 10*3/uL (ref 0.0–0.7)
Eosinophils Relative: 1 % (ref 0–5)
Eosinophils Relative: 1 % (ref 0–5)
Lymphocytes Relative: 5 % — ABNORMAL LOW (ref 12–46)
Lymphocytes Relative: 6 % — ABNORMAL LOW (ref 12–46)
Lymphs Abs: 0.3 10*3/uL — ABNORMAL LOW (ref 0.7–4.0)
Lymphs Abs: 0.4 10*3/uL — ABNORMAL LOW (ref 0.7–4.0)
Monocytes Absolute: 0.4 10*3/uL (ref 0.1–1.0)
Monocytes Absolute: 0.7 10*3/uL (ref 0.1–1.0)
Monocytes Relative: 7 % (ref 3–12)
Monocytes Relative: 9 % (ref 3–12)
Neutro Abs: 4.9 10*3/uL (ref 1.7–7.7)
Neutro Abs: 6.7 10*3/uL (ref 1.7–7.7)
Neutrophils Relative %: 85 % — ABNORMAL HIGH (ref 43–77)
Neutrophils Relative %: 86 % — ABNORMAL HIGH (ref 43–77)
Smear Review: DECREASED

## 2010-05-11 LAB — COMPREHENSIVE METABOLIC PANEL
ALT: 12 U/L (ref 0–35)
AST: 22 U/L (ref 0–37)
Albumin: 3.7 g/dL (ref 3.5–5.2)
Alkaline Phosphatase: 87 U/L (ref 39–117)
BUN: 19 mg/dL (ref 6–23)
CO2: 25 mEq/L (ref 19–32)
Calcium: 9.5 mg/dL (ref 8.4–10.5)
Chloride: 107 mEq/L (ref 96–112)
Creatinine, Ser: 1.8 mg/dL — ABNORMAL HIGH (ref 0.4–1.2)
GFR calc Af Amer: 38 mL/min — ABNORMAL LOW (ref >60)
GFR calc non Af Amer: 31 mL/min — ABNORMAL LOW (ref >60)
Glucose, Bld: 99 mg/dL (ref 70–99)
Potassium: 3.8 mEq/L (ref 3.5–5.1)
Sodium: 141 mEq/L (ref 135–145)
Total Bilirubin: 1.2 mg/dL (ref 0.3–1.2)
Total Protein: 6.5 g/dL (ref 6.0–8.3)

## 2010-05-11 LAB — IRON AND TIBC
Iron: 121 ug/dL (ref 42–135)
Saturation Ratios: 60 % — ABNORMAL HIGH (ref 20–55)
TIBC: 202 ug/dL — ABNORMAL LOW (ref 250–470)
UIBC: 81 ug/dL

## 2010-05-11 LAB — RENAL FUNCTION PANEL
Albumin: 3.1 g/dL — ABNORMAL LOW (ref 3.5–5.2)
Albumin: 3.2 g/dL — ABNORMAL LOW (ref 3.5–5.2)
BUN: 18 mg/dL (ref 6–23)
BUN: 20 mg/dL (ref 6–23)
CO2: 23 mEq/L (ref 19–32)
CO2: 24 mEq/L (ref 19–32)
Calcium: 8.8 mg/dL (ref 8.4–10.5)
Calcium: 9.1 mg/dL (ref 8.4–10.5)
Chloride: 101 mEq/L (ref 96–112)
Chloride: 105 mEq/L (ref 96–112)
Creatinine, Ser: 1.85 mg/dL — ABNORMAL HIGH (ref 0.4–1.2)
Creatinine, Ser: 1.91 mg/dL — ABNORMAL HIGH (ref 0.4–1.2)
GFR calc Af Amer: 35 mL/min — ABNORMAL LOW (ref >60)
GFR calc Af Amer: 37 mL/min — ABNORMAL LOW (ref >60)
GFR calc non Af Amer: 29 mL/min — ABNORMAL LOW (ref >60)
GFR calc non Af Amer: 30 mL/min — ABNORMAL LOW (ref >60)
Glucose, Bld: 79 mg/dL (ref 70–99)
Glucose, Bld: 83 mg/dL (ref 70–99)
Phosphorus: 2.8 mg/dL (ref 2.3–4.6)
Phosphorus: 3 mg/dL (ref 2.3–4.6)
Potassium: 3.6 mEq/L (ref 3.5–5.1)
Potassium: 3.9 mEq/L (ref 3.5–5.1)
Sodium: 134 mEq/L — ABNORMAL LOW (ref 135–145)
Sodium: 136 mEq/L (ref 135–145)

## 2010-05-11 LAB — CBC
HCT: 25.1 % — ABNORMAL LOW (ref 36.0–46.0)
HCT: 25.5 % — ABNORMAL LOW (ref 36.0–46.0)
HCT: 30 % — ABNORMAL LOW (ref 36.0–46.0)
Hemoglobin: 10.5 g/dL — ABNORMAL LOW (ref 12.0–15.0)
Hemoglobin: 8.9 g/dL — ABNORMAL LOW (ref 12.0–15.0)
Hemoglobin: 9 g/dL — ABNORMAL LOW (ref 12.0–15.0)
MCHC: 35 g/dL (ref 30.0–36.0)
MCHC: 35.4 g/dL (ref 30.0–36.0)
MCHC: 35.6 g/dL (ref 30.0–36.0)
MCV: 87.9 fL (ref 78.0–100.0)
MCV: 88.1 fL (ref 78.0–100.0)
MCV: 89.7 fL (ref 78.0–100.0)
Platelets: 120 10*3/uL — ABNORMAL LOW (ref 150–400)
Platelets: 129 10*3/uL — ABNORMAL LOW (ref 150–400)
Platelets: 145 10*3/uL — ABNORMAL LOW (ref 150–400)
RBC: 2.85 MIL/uL — ABNORMAL LOW (ref 3.87–5.11)
RBC: 2.89 MIL/uL — ABNORMAL LOW (ref 3.87–5.11)
RBC: 3.34 MIL/uL — ABNORMAL LOW (ref 3.87–5.11)
RDW: 24.3 % — ABNORMAL HIGH (ref 11.5–15.5)
RDW: 24.3 % — ABNORMAL HIGH (ref 11.5–15.5)
RDW: 24.4 % — ABNORMAL HIGH (ref 11.5–15.5)
WBC: 5.7 10*3/uL (ref 4.0–10.5)
WBC: 6.8 10*3/uL (ref 4.0–10.5)
WBC: 7.9 10*3/uL (ref 4.0–10.5)

## 2010-05-11 LAB — PROTIME-INR
INR: 1.1 (ref 0.00–1.49)
Prothrombin Time: 14.8 seconds (ref 11.6–15.2)

## 2010-05-11 LAB — CULTURE, BLOOD (ROUTINE X 2)
Culture: NO GROWTH
Culture: NO GROWTH

## 2010-05-11 LAB — URINE CULTURE: Colony Count: >=100000

## 2010-05-11 LAB — BASIC METABOLIC PANEL
BUN: 20 mg/dL (ref 6–23)
CO2: 24 mEq/L (ref 19–32)
Calcium: 8.8 mg/dL (ref 8.4–10.5)
Chloride: 107 mEq/L (ref 96–112)
Creatinine, Ser: 1.68 mg/dL — ABNORMAL HIGH (ref 0.4–1.2)
GFR calc Af Amer: 41 mL/min — ABNORMAL LOW (ref >60)
GFR calc non Af Amer: 34 mL/min — ABNORMAL LOW (ref >60)
Glucose, Bld: 84 mg/dL (ref 70–99)
Potassium: 3.8 mEq/L (ref 3.5–5.1)
Sodium: 139 mEq/L (ref 135–145)

## 2010-05-11 LAB — LIPID PANEL
Cholesterol: 171 mg/dL (ref 0–200)
HDL: 58 mg/dL (ref >39)
LDL Cholesterol: 92 mg/dL (ref 0–99)
Total CHOL/HDL Ratio: 2.9 RATIO
Triglycerides: 103 mg/dL (ref <150)
VLDL: 21 mg/dL (ref 0–40)

## 2010-05-11 LAB — C-REACTIVE PROTEIN: CRP: 15.4 mg/dL — ABNORMAL HIGH (ref <0.6)

## 2010-05-11 LAB — POCT I-STAT, CHEM 8
BUN: 21 mg/dL (ref 6–23)
Calcium, Ion: 1.15 mmol/L (ref 1.12–1.32)
Chloride: 106 mEq/L (ref 96–112)
Creatinine, Ser: 2.1 mg/dL — ABNORMAL HIGH (ref 0.4–1.2)
Glucose, Bld: 95 mg/dL (ref 70–99)
HCT: 30 % — ABNORMAL LOW (ref 36.0–46.0)
Hemoglobin: 10.2 g/dL — ABNORMAL LOW (ref 12.0–15.0)
Potassium: 3.7 mEq/L (ref 3.5–5.1)
Sodium: 138 mEq/L (ref 135–145)
TCO2: 23 mmol/L (ref 0–100)

## 2010-05-11 LAB — FERRITIN: Ferritin: 2183 ng/mL — ABNORMAL HIGH (ref 10–291)

## 2010-05-11 LAB — TSH: TSH: 0.627 u[IU]/mL (ref 0.350–4.500)

## 2010-05-11 LAB — URINE MICROSCOPIC-ADD ON

## 2010-05-11 LAB — URINALYSIS, ROUTINE W REFLEX MICROSCOPIC
Bilirubin Urine: NEGATIVE
Glucose, UA: NEGATIVE mg/dL
Ketones, ur: NEGATIVE mg/dL
Nitrite: POSITIVE — AB
Protein, ur: 30 mg/dL — AB
Specific Gravity, Urine: 1.011 (ref 1.005–1.030)
Urobilinogen, UA: 1 mg/dL (ref 0.0–1.0)
pH: 6 (ref 5.0–8.0)

## 2010-05-11 LAB — APTT: aPTT: 42 seconds — ABNORMAL HIGH (ref 24–37)

## 2010-05-11 LAB — GLUCOSE, CAPILLARY: Glucose-Capillary: 97 mg/dL (ref 70–99)

## 2010-05-11 LAB — TACROLIMUS LEVEL: Tacrolimus (FK506) - LabCorp: 6.9 ng/mL

## 2010-05-11 LAB — LACTIC ACID, PLASMA: Lactic Acid, Venous: 2.1 mmol/L (ref 0.5–2.2)

## 2010-05-11 LAB — POCT HEMOGLOBIN-HEMACUE: Hemoglobin: 10.1 g/dL — ABNORMAL LOW (ref 12.0–15.0)

## 2010-05-11 LAB — MAGNESIUM: Magnesium: 1.9 mg/dL (ref 1.5–2.5)

## 2010-05-11 LAB — IRON: Iron: 25 ug/dL — ABNORMAL LOW (ref 42–135)

## 2010-05-11 LAB — PHOSPHORUS: Phosphorus: 2.5 mg/dL (ref 2.3–4.6)

## 2010-05-12 LAB — POCT I-STAT, CHEM 8
BUN: 23 mg/dL (ref 6–23)
Calcium, Ion: 1.19 mmol/L (ref 1.12–1.32)
Chloride: 107 mEq/L (ref 96–112)
Creatinine, Ser: 2 mg/dL — ABNORMAL HIGH (ref 0.4–1.2)
Glucose, Bld: 82 mg/dL (ref 70–99)
HCT: 38 % (ref 36.0–46.0)
Hemoglobin: 12.9 g/dL (ref 12.0–15.0)
Potassium: 3.6 mEq/L (ref 3.5–5.1)
Sodium: 138 mEq/L (ref 135–145)
TCO2: 21 mmol/L (ref 0–100)

## 2010-05-12 LAB — HEPATIC FUNCTION PANEL
ALT: 12 U/L (ref 0–35)
AST: 28 U/L (ref 0–37)
Albumin: 4.4 g/dL (ref 3.5–5.2)
Alkaline Phosphatase: 102 U/L (ref 39–117)
Bilirubin, Direct: 0.1 mg/dL (ref 0.0–0.3)
Indirect Bilirubin: 1.1 mg/dL — ABNORMAL HIGH (ref 0.3–0.9)
Total Bilirubin: 1.2 mg/dL (ref 0.3–1.2)
Total Protein: 7.4 g/dL (ref 6.0–8.3)

## 2010-05-12 LAB — DIFFERENTIAL
Basophils Absolute: 0.1 10*3/uL (ref 0.0–0.1)
Basophils Relative: 1 % (ref 0–1)
Eosinophils Absolute: 0.1 10*3/uL (ref 0.0–0.7)
Eosinophils Relative: 1 % (ref 0–5)
Lymphocytes Relative: 14 % (ref 12–46)
Lymphs Abs: 0.8 10*3/uL (ref 0.7–4.0)
Monocytes Absolute: 0.4 10*3/uL (ref 0.1–1.0)
Monocytes Relative: 6 % (ref 3–12)
Neutro Abs: 4.6 10*3/uL (ref 1.7–7.7)
Neutrophils Relative %: 78 % — ABNORMAL HIGH (ref 43–77)
Smear Review: ADEQUATE

## 2010-05-12 LAB — URINALYSIS, ROUTINE W REFLEX MICROSCOPIC
Bilirubin Urine: NEGATIVE
Glucose, UA: NEGATIVE mg/dL
Hgb urine dipstick: NEGATIVE
Ketones, ur: 15 mg/dL — AB
Leukocytes, UA: NEGATIVE
Nitrite: NEGATIVE
Protein, ur: 30 mg/dL — AB
Specific Gravity, Urine: 1.014 (ref 1.005–1.030)
Urobilinogen, UA: 1 mg/dL (ref 0.0–1.0)
pH: 6 (ref 5.0–8.0)

## 2010-05-12 LAB — POCT PREGNANCY, URINE: Preg Test, Ur: NEGATIVE

## 2010-05-12 LAB — CBC
HCT: 37 % (ref 36.0–46.0)
Hemoglobin: 12.8 g/dL (ref 12.0–15.0)
MCHC: 34.7 g/dL (ref 30.0–36.0)
MCV: 86.5 fL (ref 78.0–100.0)
Platelets: 262 10*3/uL (ref 150–400)
RBC: 4.27 MIL/uL (ref 3.87–5.11)
RDW: 21.2 % — ABNORMAL HIGH (ref 11.5–15.5)
WBC: 6 10*3/uL (ref 4.0–10.5)

## 2010-05-12 LAB — POCT HEMOGLOBIN-HEMACUE
Hemoglobin: 10.5 g/dL — ABNORMAL LOW (ref 12.0–15.0)
Hemoglobin: 11.7 g/dL — ABNORMAL LOW (ref 12.0–15.0)
Hemoglobin: 11.8 g/dL — ABNORMAL LOW (ref 12.0–15.0)

## 2010-05-12 LAB — IRON AND TIBC
Iron: 93 ug/dL (ref 42–135)
Saturation Ratios: 47 % (ref 20–55)
TIBC: 198 ug/dL — ABNORMAL LOW (ref 250–470)
UIBC: 105 ug/dL

## 2010-05-12 LAB — LIPASE, BLOOD: Lipase: 25 U/L (ref 11–59)

## 2010-05-12 LAB — URINE MICROSCOPIC-ADD ON

## 2010-05-13 LAB — IRON AND TIBC
Iron: 123 ug/dL (ref 42–135)
Saturation Ratios: 56 % — ABNORMAL HIGH (ref 20–55)
TIBC: 219 ug/dL — ABNORMAL LOW (ref 250–470)
UIBC: 96 ug/dL

## 2010-05-13 LAB — POCT HEMOGLOBIN-HEMACUE
Hemoglobin: 11.7 g/dL — ABNORMAL LOW (ref 12.0–15.0)
Hemoglobin: 12.3 g/dL (ref 12.0–15.0)
Hemoglobin: 12.4 g/dL (ref 12.0–15.0)

## 2010-05-14 LAB — POCT HEMOGLOBIN-HEMACUE
Hemoglobin: 10.8 g/dL — ABNORMAL LOW (ref 12.0–15.0)
Hemoglobin: 10 g/dL — ABNORMAL LOW (ref 12.0–15.0)
Hemoglobin: 10.4 g/dL — ABNORMAL LOW (ref 12.0–15.0)

## 2010-05-14 LAB — BASIC METABOLIC PANEL
BUN: 11 mg/dL (ref 6–23)
CO2: 26 mEq/L (ref 19–32)
Calcium: 9.1 mg/dL (ref 8.4–10.5)
Chloride: 101 mEq/L (ref 96–112)
Creatinine, Ser: 1.37 mg/dL — ABNORMAL HIGH (ref 0.4–1.2)
GFR calc Af Amer: 52 mL/min — ABNORMAL LOW (ref >60)
GFR calc non Af Amer: 43 mL/min — ABNORMAL LOW (ref >60)
Glucose, Bld: 79 mg/dL (ref 70–99)
Potassium: 3.6 mEq/L (ref 3.5–5.1)
Sodium: 135 mEq/L (ref 135–145)

## 2010-05-14 LAB — CBC
HCT: 32.3 % — ABNORMAL LOW (ref 36.0–46.0)
Hemoglobin: 10.4 g/dL — ABNORMAL LOW (ref 12.0–15.0)
MCHC: 32.3 g/dL (ref 30.0–36.0)
MCV: 88.4 fL (ref 78.0–100.0)
Platelets: 209 10*3/uL (ref 150–400)
RBC: 3.65 MIL/uL — ABNORMAL LOW (ref 3.87–5.11)
RDW: 22.7 % — ABNORMAL HIGH (ref 11.5–15.5)
WBC: 5.2 10*3/uL (ref 4.0–10.5)

## 2010-05-14 LAB — IRON AND TIBC
Iron: 39 ug/dL — ABNORMAL LOW (ref 42–135)
Saturation Ratios: 24 % (ref 20–55)
TIBC: 160 ug/dL — ABNORMAL LOW (ref 250–470)
UIBC: 121 ug/dL

## 2010-05-14 LAB — CULTURE, BLOOD (ROUTINE X 2)
Culture: NO GROWTH
Culture: NO GROWTH

## 2010-05-14 LAB — DIFFERENTIAL
Basophils Absolute: 0 10*3/uL (ref 0.0–0.1)
Basophils Relative: 0 % (ref 0–1)
Eosinophils Absolute: 0.1 10*3/uL (ref 0.0–0.7)
Eosinophils Relative: 2 % (ref 0–5)
Lymphocytes Relative: 14 % (ref 12–46)
Lymphs Abs: 0.7 10*3/uL (ref 0.7–4.0)
Monocytes Absolute: 0.4 10*3/uL (ref 0.1–1.0)
Monocytes Relative: 9 % (ref 3–12)
Neutro Abs: 3.9 10*3/uL (ref 1.7–7.7)
Neutrophils Relative %: 75 % (ref 43–77)

## 2010-05-14 LAB — TACROLIMUS LEVEL: Tacrolimus (FK506) - LabCorp: 4.5 ng/mL

## 2010-05-14 LAB — FERRITIN: Ferritin: 1438 ng/mL — ABNORMAL HIGH (ref 10–291)

## 2010-05-15 LAB — IRON AND TIBC
Iron: 44 ug/dL (ref 42–135)
Saturation Ratios: 28 % (ref 20–55)
TIBC: 158 ug/dL — ABNORMAL LOW (ref 250–470)
UIBC: 114 ug/dL

## 2010-05-15 LAB — COMPREHENSIVE METABOLIC PANEL
ALT: 10 U/L (ref 0–35)
ALT: 8 U/L (ref 0–35)
AST: 14 U/L (ref 0–37)
AST: 17 U/L (ref 0–37)
Albumin: 3.8 g/dL (ref 3.5–5.2)
Albumin: 4 g/dL (ref 3.5–5.2)
Alkaline Phosphatase: 83 U/L (ref 39–117)
Alkaline Phosphatase: 91 U/L (ref 39–117)
BUN: 13 mg/dL (ref 6–23)
BUN: 13 mg/dL (ref 6–23)
CO2: 24 mEq/L (ref 19–32)
CO2: 25 mEq/L (ref 19–32)
Calcium: 8.9 mg/dL (ref 8.4–10.5)
Calcium: 9.2 mg/dL (ref 8.4–10.5)
Chloride: 104 mEq/L (ref 96–112)
Chloride: 104 mEq/L (ref 96–112)
Creatinine, Ser: 1.82 mg/dL — ABNORMAL HIGH (ref 0.4–1.2)
Creatinine, Ser: 1.99 mg/dL — ABNORMAL HIGH (ref 0.4–1.2)
GFR calc Af Amer: 34 mL/min — ABNORMAL LOW (ref >60)
GFR calc Af Amer: 37 mL/min — ABNORMAL LOW (ref >60)
GFR calc non Af Amer: 28 mL/min — ABNORMAL LOW (ref >60)
GFR calc non Af Amer: 31 mL/min — ABNORMAL LOW (ref >60)
Glucose, Bld: 83 mg/dL (ref 70–99)
Glucose, Bld: 90 mg/dL (ref 70–99)
Potassium: 3.5 mEq/L (ref 3.5–5.1)
Potassium: 4.2 mEq/L (ref 3.5–5.1)
Sodium: 135 mEq/L (ref 135–145)
Sodium: 135 mEq/L (ref 135–145)
Total Bilirubin: 0.7 mg/dL (ref 0.3–1.2)
Total Bilirubin: 0.9 mg/dL (ref 0.3–1.2)
Total Protein: 5.8 g/dL — ABNORMAL LOW (ref 6.0–8.3)
Total Protein: 6 g/dL (ref 6.0–8.3)

## 2010-05-15 LAB — CBC
HCT: 27.3 % — ABNORMAL LOW (ref 36.0–46.0)
HCT: 29.6 % — ABNORMAL LOW (ref 36.0–46.0)
HCT: 30.5 % — ABNORMAL LOW (ref 36.0–46.0)
Hemoglobin: 10.3 g/dL — ABNORMAL LOW (ref 12.0–15.0)
Hemoglobin: 9.2 g/dL — ABNORMAL LOW (ref 12.0–15.0)
Hemoglobin: 9.9 g/dL — ABNORMAL LOW (ref 12.0–15.0)
MCHC: 33.4 g/dL (ref 30.0–36.0)
MCHC: 33.8 g/dL (ref 30.0–36.0)
MCHC: 33.9 g/dL (ref 30.0–36.0)
MCV: 86 fL (ref 78.0–100.0)
MCV: 87 fL (ref 78.0–100.0)
MCV: 87.2 fL (ref 78.0–100.0)
Platelets: 177 10*3/uL (ref 150–400)
Platelets: 185 10*3/uL (ref 150–400)
Platelets: 190 10*3/uL (ref 150–400)
RBC: 3.14 MIL/uL — ABNORMAL LOW (ref 3.87–5.11)
RBC: 3.4 MIL/uL — ABNORMAL LOW (ref 3.87–5.11)
RBC: 3.54 MIL/uL — ABNORMAL LOW (ref 3.87–5.11)
RDW: 22.7 % — ABNORMAL HIGH (ref 11.5–15.5)
RDW: 23.1 % — ABNORMAL HIGH (ref 11.5–15.5)
RDW: 23.2 % — ABNORMAL HIGH (ref 11.5–15.5)
WBC: 5.3 10*3/uL (ref 4.0–10.5)
WBC: 5.4 10*3/uL (ref 4.0–10.5)
WBC: 6.1 10*3/uL (ref 4.0–10.5)

## 2010-05-15 LAB — DIFFERENTIAL
Basophils Absolute: 0 10*3/uL (ref 0.0–0.1)
Basophils Relative: 0 % (ref 0–1)
Eosinophils Absolute: 0 10*3/uL (ref 0.0–0.7)
Eosinophils Relative: 0 % (ref 0–5)
Lymphocytes Relative: 6 % — ABNORMAL LOW (ref 12–46)
Lymphs Abs: 0.3 10*3/uL — ABNORMAL LOW (ref 0.7–4.0)
Monocytes Absolute: 0.3 10*3/uL (ref 0.1–1.0)
Monocytes Relative: 5 % (ref 3–12)
Neutro Abs: 4.8 10*3/uL (ref 1.7–7.7)
Neutrophils Relative %: 89 % — ABNORMAL HIGH (ref 43–77)

## 2010-05-15 LAB — URINE CULTURE
Colony Count: NO GROWTH
Culture: NO GROWTH

## 2010-05-15 LAB — ACTH STIMULATION, 3 TIME POINTS
Cortisol, 30 Min: 13.1 ug/dL (ref >20)
Cortisol, 60 Min: 11.4 ug/dL (ref >20)
Cortisol, Base: 14.6 ug/dL

## 2010-05-15 LAB — CORTISOL: Cortisol, Plasma: 2.7 ug/dL

## 2010-05-15 LAB — TACROLIMUS, BLOOD: Tacrolimus Lvl: 5.2 ng/mL (ref 5.0–20.0)

## 2010-05-15 LAB — BASIC METABOLIC PANEL
BUN: 16 mg/dL (ref 6–23)
CO2: 18 mEq/L — ABNORMAL LOW (ref 19–32)
Calcium: 9.5 mg/dL (ref 8.4–10.5)
Chloride: 109 mEq/L (ref 96–112)
Creatinine, Ser: 1.65 mg/dL — ABNORMAL HIGH (ref 0.4–1.2)
GFR calc Af Amer: 42 mL/min — ABNORMAL LOW (ref >60)
GFR calc non Af Amer: 34 mL/min — ABNORMAL LOW (ref >60)
Glucose, Bld: 68 mg/dL — ABNORMAL LOW (ref 70–99)
Potassium: 4.3 mEq/L (ref 3.5–5.1)
Sodium: 139 mEq/L (ref 135–145)

## 2010-05-15 LAB — URINALYSIS, ROUTINE W REFLEX MICROSCOPIC
Bilirubin Urine: NEGATIVE
Glucose, UA: NEGATIVE mg/dL
Hgb urine dipstick: NEGATIVE
Ketones, ur: >80 mg/dL — AB
Leukocytes, UA: NEGATIVE
Nitrite: NEGATIVE
Protein, ur: 100 mg/dL — AB
Specific Gravity, Urine: 1.018 (ref 1.005–1.030)
Urobilinogen, UA: 0.2 mg/dL (ref 0.0–1.0)
pH: 7.5 (ref 5.0–8.0)

## 2010-05-15 LAB — AMYLASE: Amylase: 107 U/L (ref 27–131)

## 2010-05-15 LAB — FERRITIN: Ferritin: 1563 ng/mL — ABNORMAL HIGH (ref 10–291)

## 2010-05-15 LAB — POCT HEMOGLOBIN-HEMACUE
Hemoglobin: 9.6 g/dL — ABNORMAL LOW (ref 12.0–15.0)
Hemoglobin: 9.6 g/dL — ABNORMAL LOW (ref 12.0–15.0)
Hemoglobin: 9.5 g/dL — ABNORMAL LOW (ref 12.0–15.0)

## 2010-05-15 LAB — URINE MICROSCOPIC-ADD ON

## 2010-05-15 LAB — TSH: TSH: 0.496 u[IU]/mL (ref 0.350–4.500)

## 2010-05-15 LAB — LIPASE, BLOOD: Lipase: 38 U/L (ref 11–59)

## 2010-05-15 LAB — POCT PREGNANCY, URINE: Preg Test, Ur: NEGATIVE

## 2010-05-19 LAB — POCT HEMOGLOBIN-HEMACUE
Hemoglobin: 11.2 g/dL — ABNORMAL LOW (ref 12.0–15.0)
Hemoglobin: 11.9 g/dL — ABNORMAL LOW (ref 12.0–15.0)

## 2010-05-20 LAB — URINALYSIS, ROUTINE W REFLEX MICROSCOPIC
Glucose, UA: NEGATIVE mg/dL
Hgb urine dipstick: NEGATIVE
Ketones, ur: 15 mg/dL — AB
Nitrite: NEGATIVE
Protein, ur: 100 mg/dL — AB
Specific Gravity, Urine: 1.019 (ref 1.005–1.030)
Urobilinogen, UA: 0.2 mg/dL (ref 0.0–1.0)
pH: 5 (ref 5.0–8.0)

## 2010-05-20 LAB — URINE MICROSCOPIC-ADD ON

## 2010-05-20 LAB — COMPREHENSIVE METABOLIC PANEL
ALT: 13 U/L (ref 0–35)
AST: 19 U/L (ref 0–37)
Albumin: 4.2 g/dL (ref 3.5–5.2)
Alkaline Phosphatase: 124 U/L — ABNORMAL HIGH (ref 39–117)
BUN: 24 mg/dL — ABNORMAL HIGH (ref 6–23)
CO2: 22 mEq/L (ref 19–32)
Calcium: 9.9 mg/dL (ref 8.4–10.5)
Chloride: 104 mEq/L (ref 96–112)
Creatinine, Ser: 2.15 mg/dL — ABNORMAL HIGH (ref 0.4–1.2)
GFR calc Af Amer: 31 mL/min — ABNORMAL LOW (ref >60)
GFR calc non Af Amer: 25 mL/min — ABNORMAL LOW (ref >60)
Glucose, Bld: 87 mg/dL (ref 70–99)
Potassium: 3.7 mEq/L (ref 3.5–5.1)
Sodium: 137 mEq/L (ref 135–145)
Total Bilirubin: 1.1 mg/dL (ref 0.3–1.2)
Total Protein: 6.9 g/dL (ref 6.0–8.3)

## 2010-05-20 LAB — DIFFERENTIAL
Basophils Absolute: 0.1 10*3/uL (ref 0.0–0.1)
Basophils Relative: 1 % (ref 0–1)
Eosinophils Absolute: 0.1 10*3/uL (ref 0.0–0.7)
Eosinophils Relative: 1 % (ref 0–5)
Lymphocytes Relative: 10 % — ABNORMAL LOW (ref 12–46)
Lymphs Abs: 1 10*3/uL (ref 0.7–4.0)
Monocytes Absolute: 0.4 10*3/uL (ref 0.1–1.0)
Monocytes Relative: 4 % (ref 3–12)
Neutro Abs: 7.9 10*3/uL — ABNORMAL HIGH (ref 1.7–7.7)
Neutrophils Relative %: 84 % — ABNORMAL HIGH (ref 43–77)

## 2010-05-20 LAB — CBC
HCT: 36.3 % (ref 36.0–46.0)
Hemoglobin: 12.6 g/dL (ref 12.0–15.0)
MCHC: 34.8 g/dL (ref 30.0–36.0)
MCV: 85.8 fL (ref 78.0–100.0)
Platelets: 228 10*3/uL (ref 150–400)
RBC: 4.23 MIL/uL (ref 3.87–5.11)
RDW: 23.6 % — ABNORMAL HIGH (ref 11.5–15.5)
WBC: 9.5 10*3/uL (ref 4.0–10.5)

## 2010-05-20 LAB — POCT PREGNANCY, URINE: Preg Test, Ur: NEGATIVE

## 2010-05-20 LAB — POCT HEMOGLOBIN-HEMACUE
Hemoglobin: 10.8 g/dL — ABNORMAL LOW (ref 12.0–15.0)
Hemoglobin: 11.1 g/dL — ABNORMAL LOW (ref 12.0–15.0)

## 2010-05-30 IMAGING — CR DG ABDOMEN ACUTE W/ 1V CHEST
3 series · 3 of 3 positions shown · non-contrast
Comparison: Chest x-ray of 07/13/2005 and abdomen films of
07/29/2007

CLINICAL DATA: Nausea, vomiting, smoking history, history of
bilateral renal transplants

ACUTE ABDOMEN SERIES (ABDOMEN 2 VIEW & CHEST 1 VIEW)

[w chest pa]
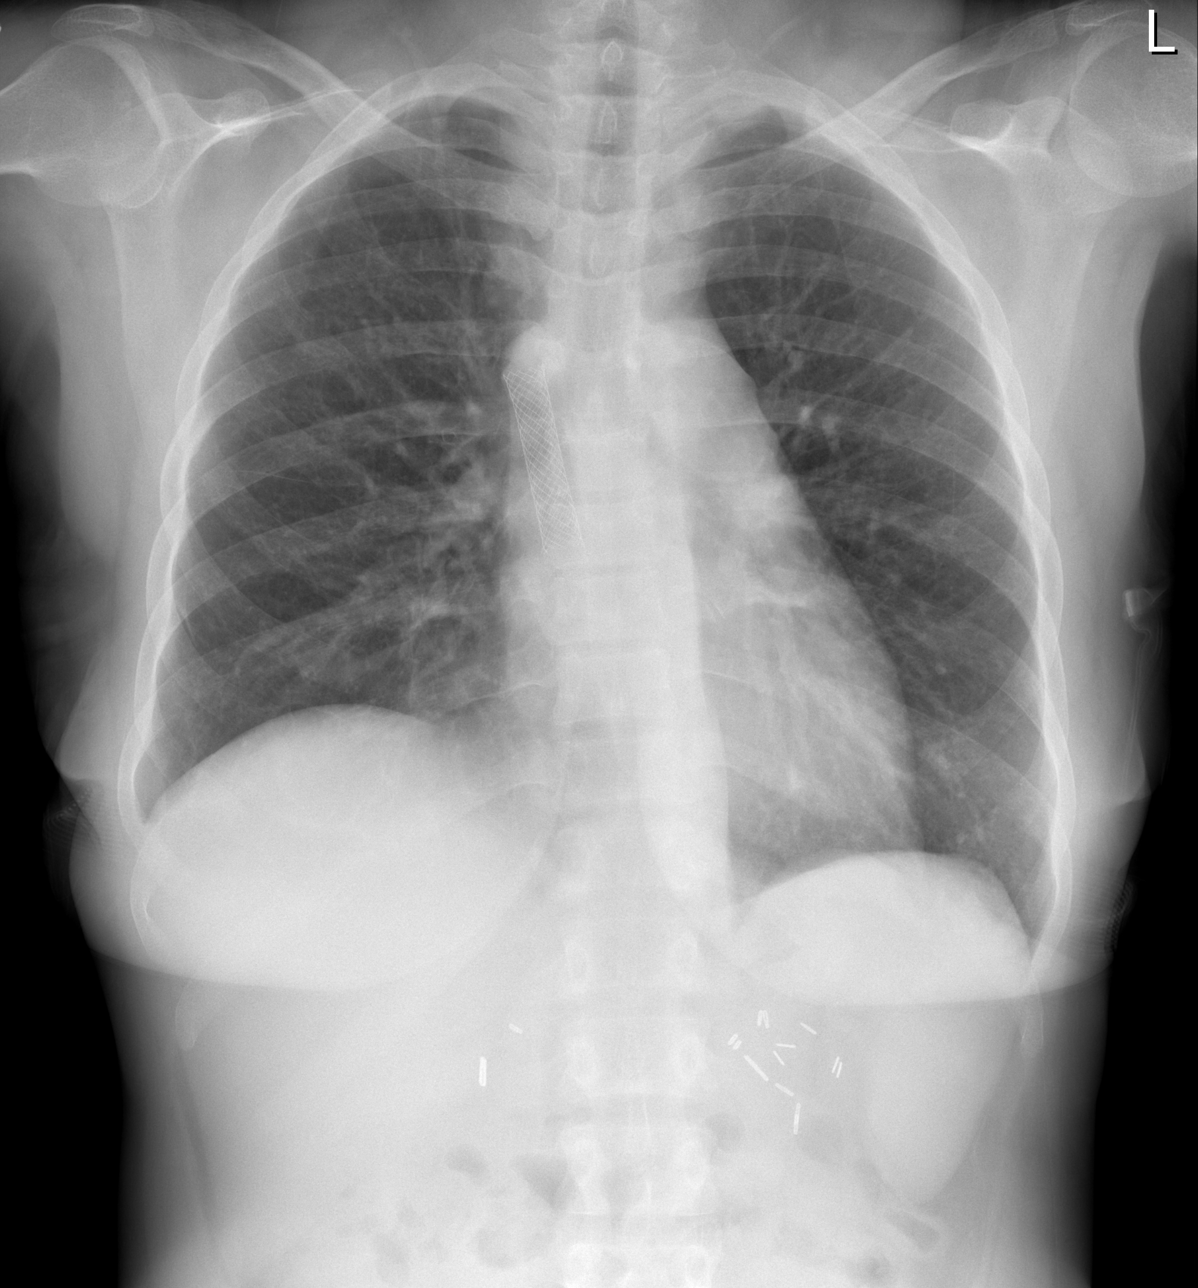

[w abdomen upright]
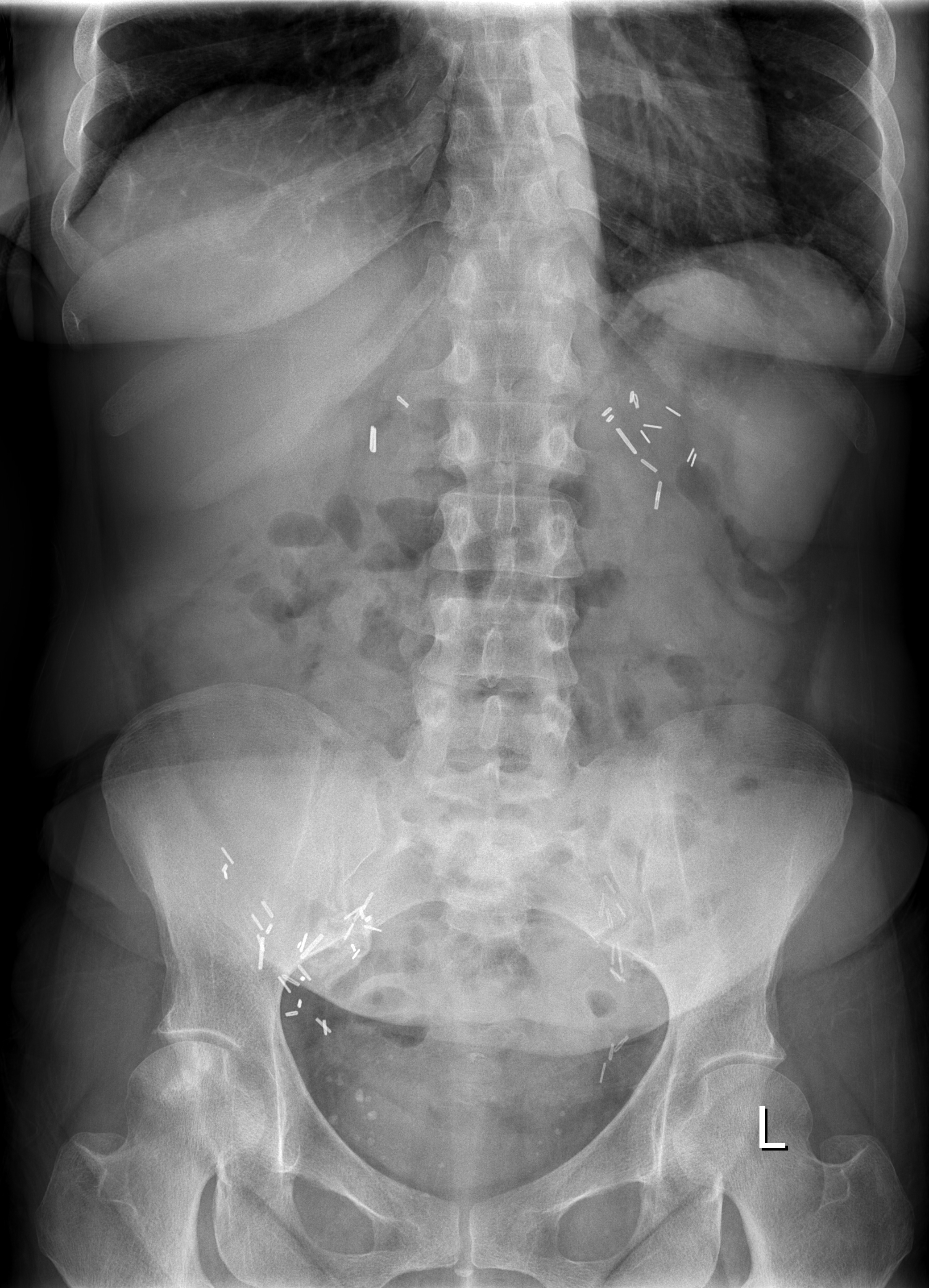

[t abdomen supine]
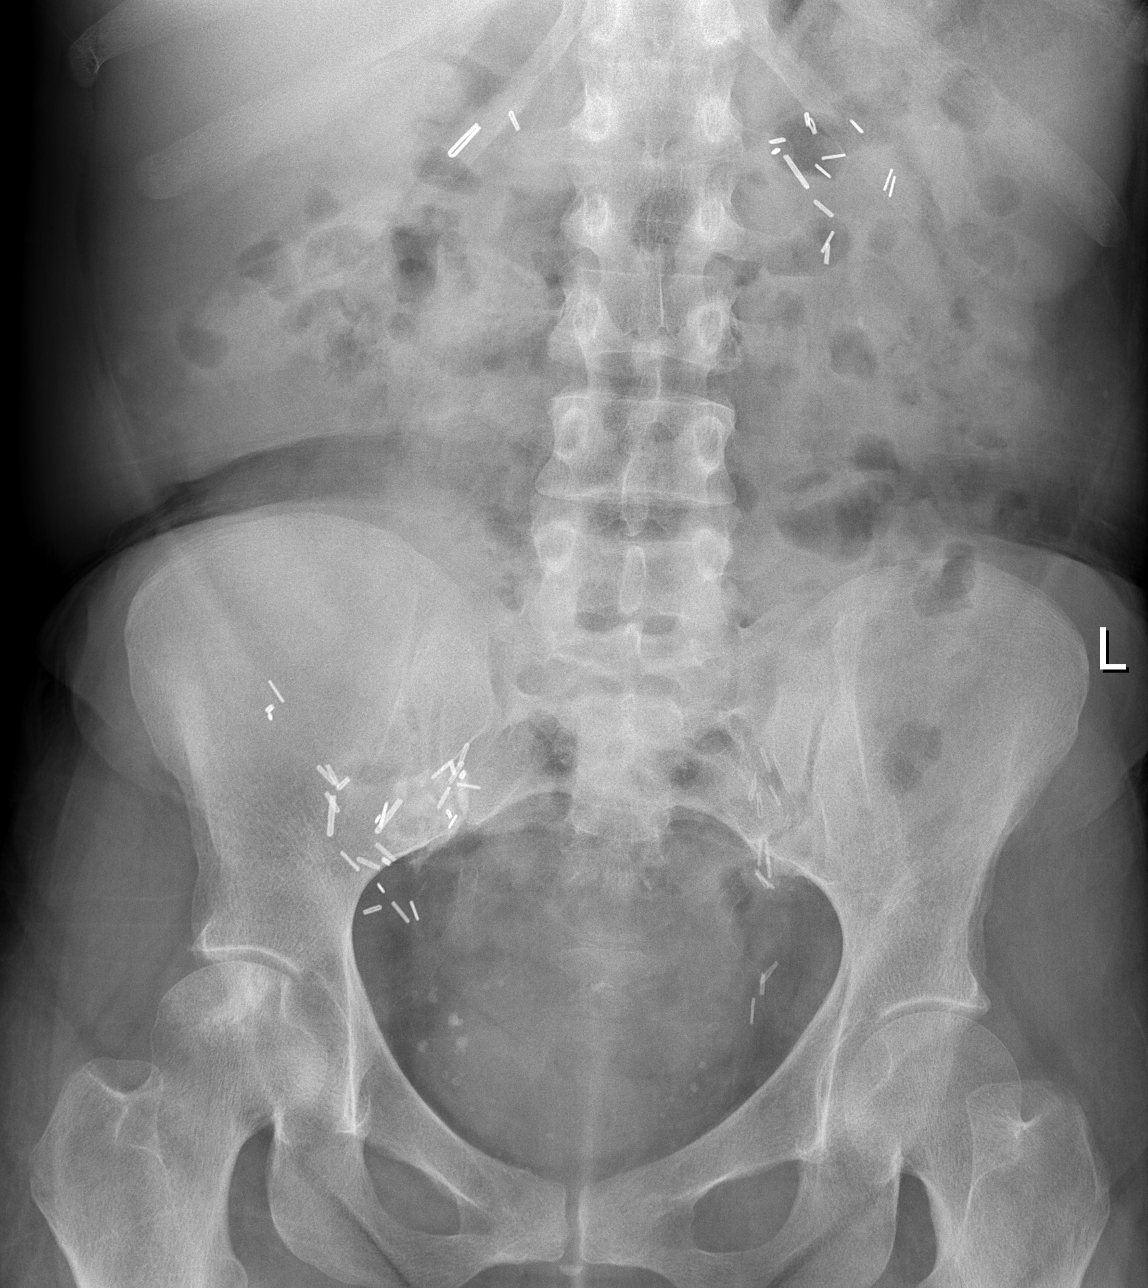

[3 of 3 positions shown; findings below may reference images not displayed]

FINDINGS: The lungs remain clear and hyperaerated.  The heart is
within upper limits normal.  Vascular stent remains in the region
of the superior vena cava.

Supine and erect views of the abdomen show no bowel obstruction.
Multiple surgical clips are present within the upper abdomen and
the pelvis.  No opaque calculi are noted.  Sclerosis within the
right femoral head is unchanged.
IMPRESSION: 1.  Stable chest x-ray.  No active lung disease.
2.  No bowel obstruction or free air.

REF:G1 DICTATED: 03/12/2008 [DATE]

## 2010-06-06 ENCOUNTER — Encounter (HOSPITAL_COMMUNITY): Payer: Medicaid Other | Attending: Nephrology

## 2010-06-06 ENCOUNTER — Other Ambulatory Visit: Payer: Self-pay | Admitting: Nephrology

## 2010-06-06 DIAGNOSIS — N183 Chronic kidney disease, stage 3 unspecified: Secondary | ICD-10-CM | POA: Insufficient documentation

## 2010-06-06 DIAGNOSIS — D638 Anemia in other chronic diseases classified elsewhere: Secondary | ICD-10-CM | POA: Insufficient documentation

## 2010-06-06 LAB — IRON AND TIBC
Iron: 71 ug/dL (ref 42–135)
Saturation Ratios: 30 % (ref 20–55)
TIBC: 235 ug/dL — ABNORMAL LOW (ref 250–470)
UIBC: 164 ug/dL

## 2010-06-06 LAB — POCT HEMOGLOBIN-HEMACUE: Hemoglobin: 10.7 g/dL — ABNORMAL LOW (ref 12.0–15.0)

## 2010-06-07 LAB — FERRITIN: Ferritin: 1745 ng/mL — ABNORMAL HIGH (ref 10–291)

## 2010-06-15 IMAGING — US US ABDOMEN COMPLETE
1 series · 13 of 25 positions shown · non-contrast
Comparison: CT 03/18/2004

CLINICAL DATA: Nausea and vomiting.  Previous bilateral
nephrectomy.

ABDOMEN ULTRASOUND
TECHNIQUE: Complete abdominal ultrasound examination was performed
including evaluation of the liver, gallbladder, bile ducts,
pancreas, kidneys, spleen, IVC, and abdominal aorta.

[Series 1: us abdomen complete · 0.17mm/px · 13 of 81 slices shown]
[im 1/81]
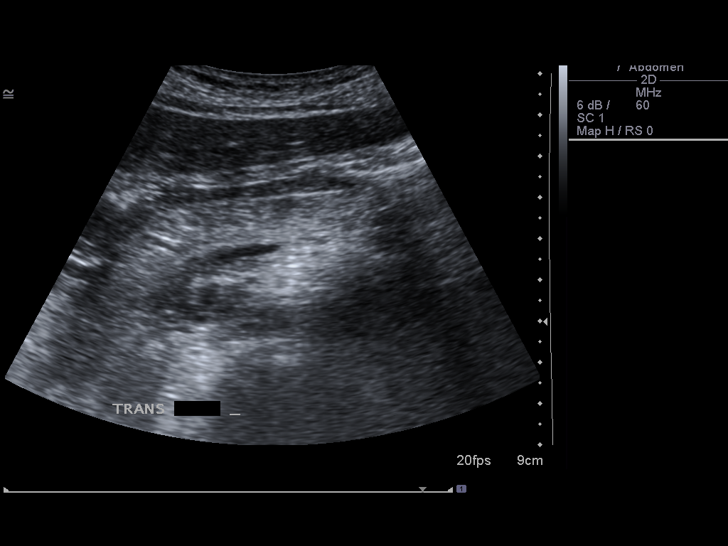
[im 7/81]
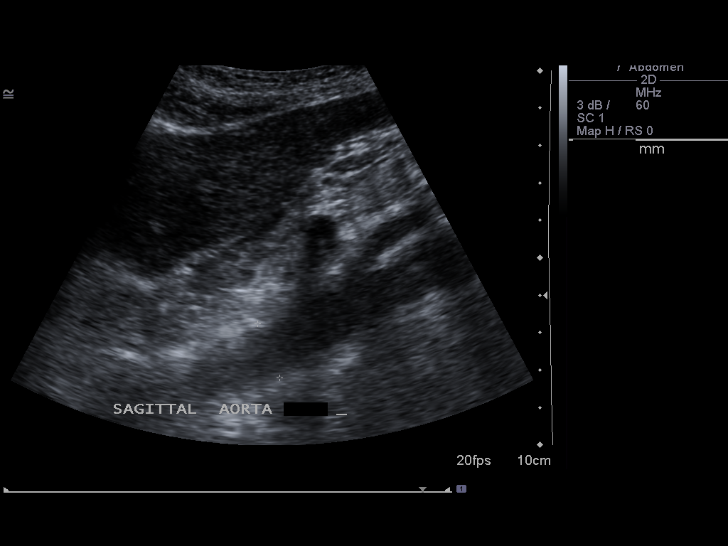
[im 14/81]
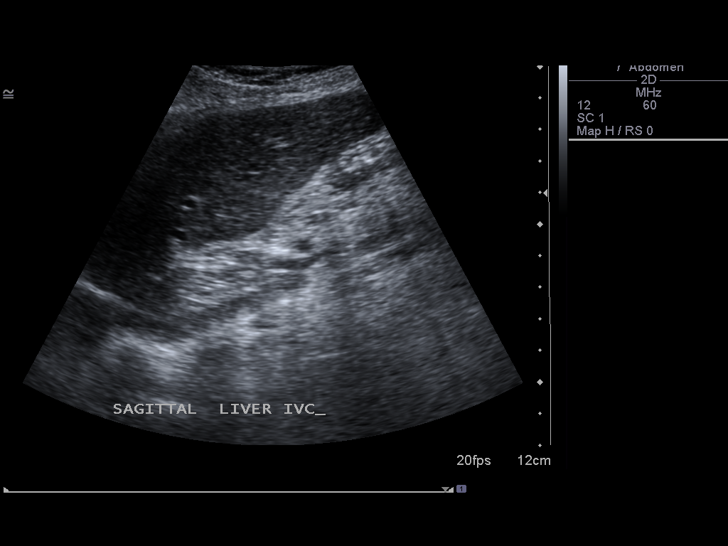
[im 21/81]
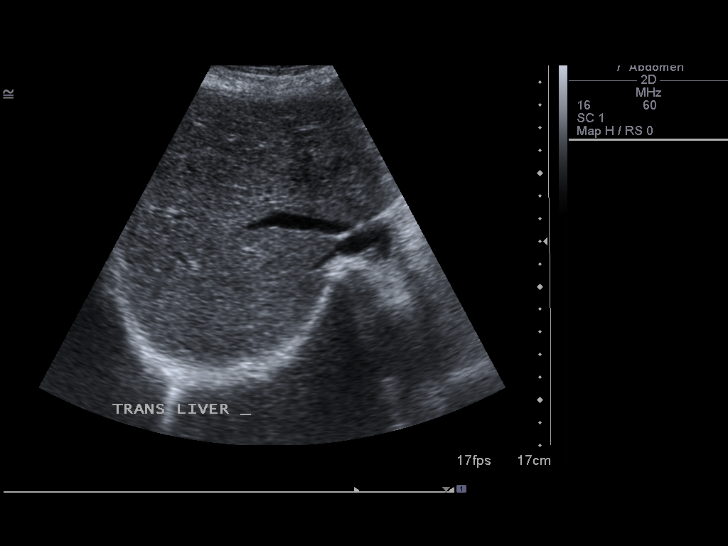
[im 27/81]
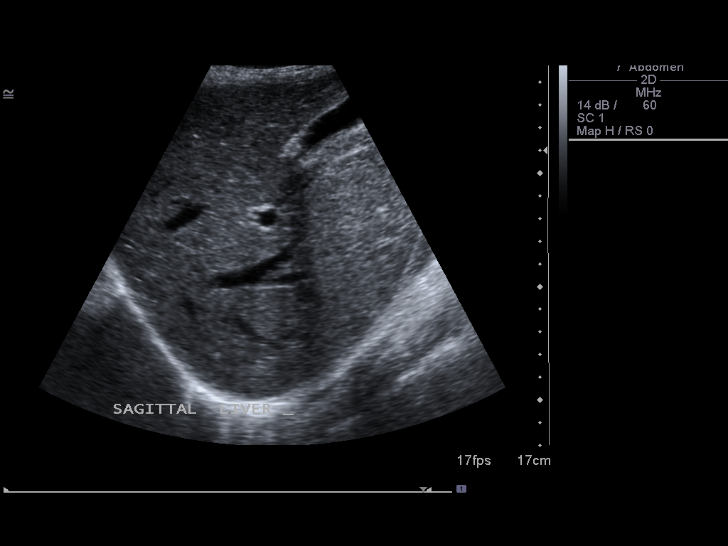
[im 34/81]
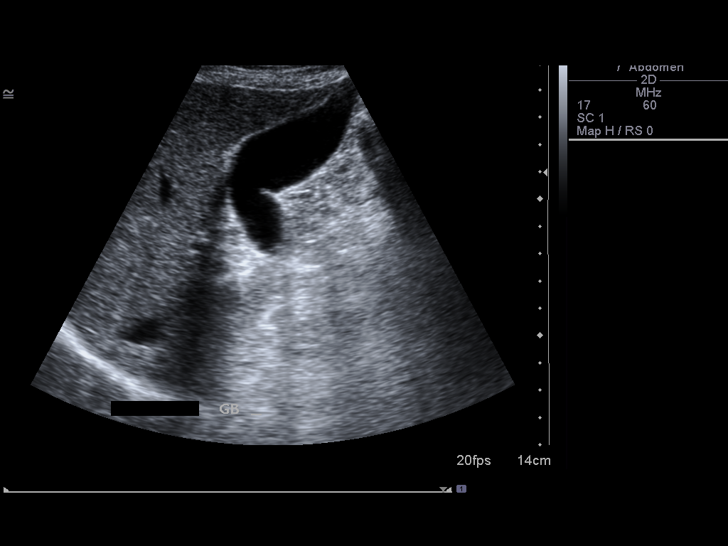
[im 41/81]
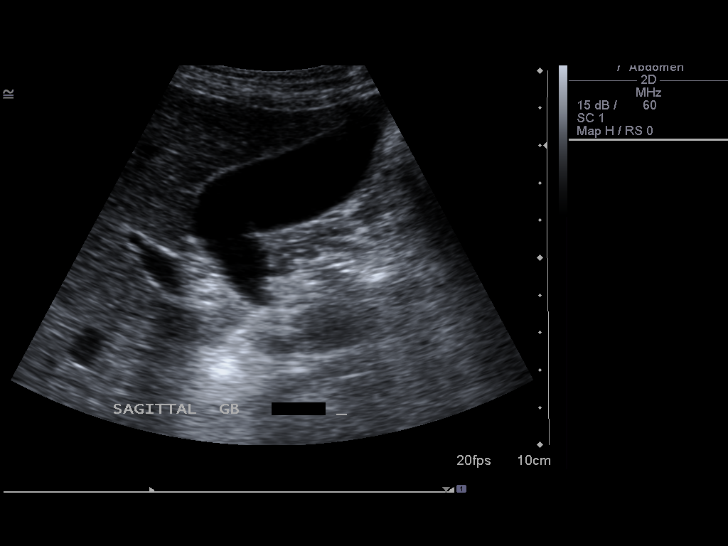
[im 47/81]
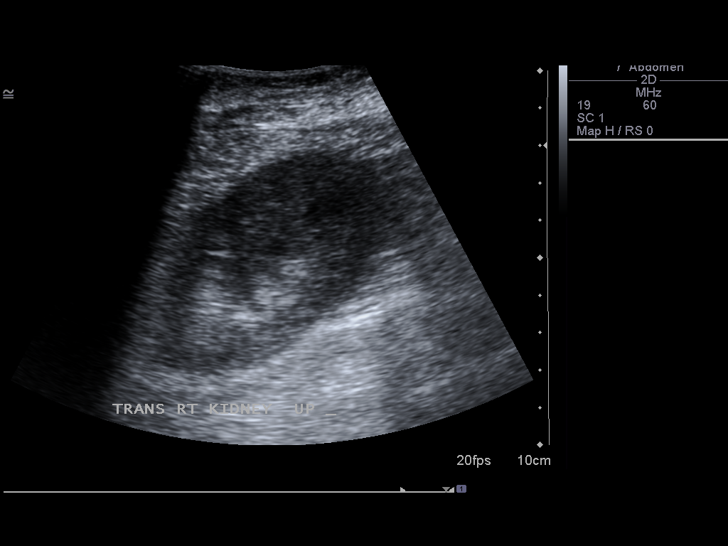
[im 54/81]
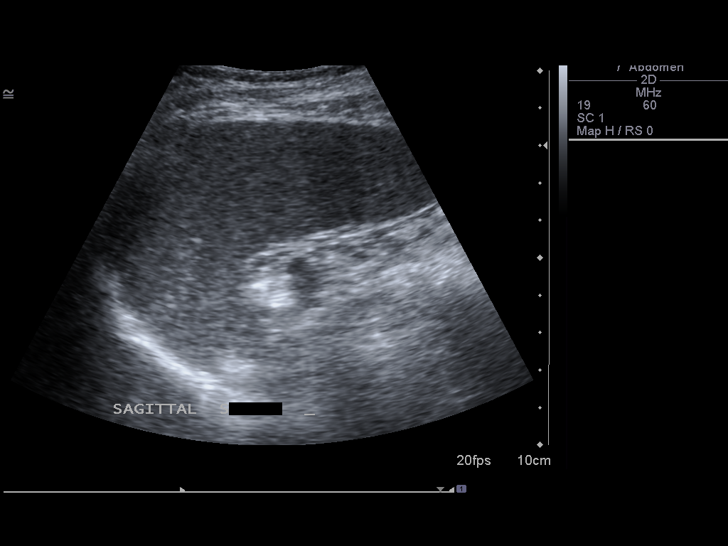
[im 61/81]
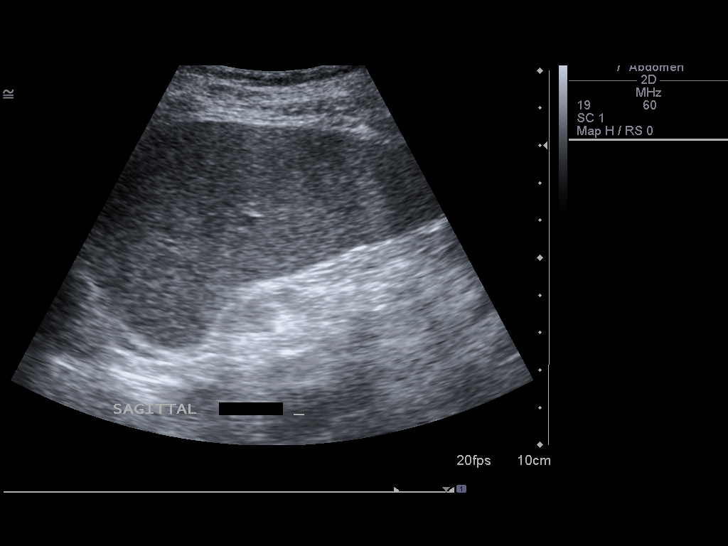
[im 67/81]
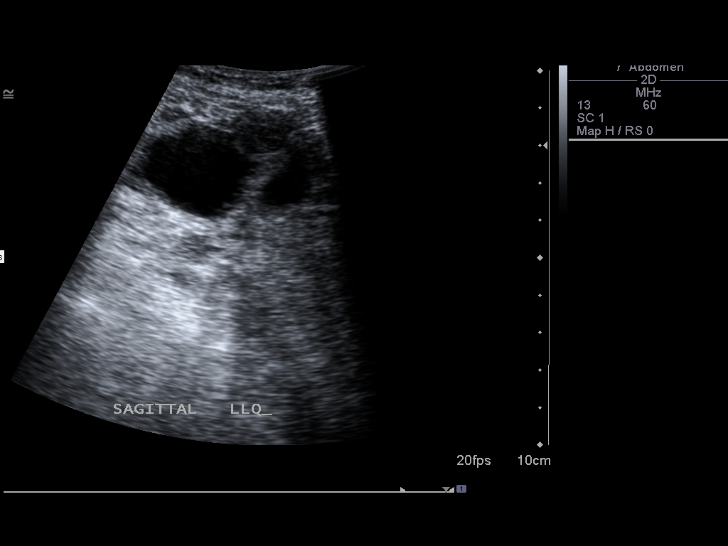
[im 74/81]
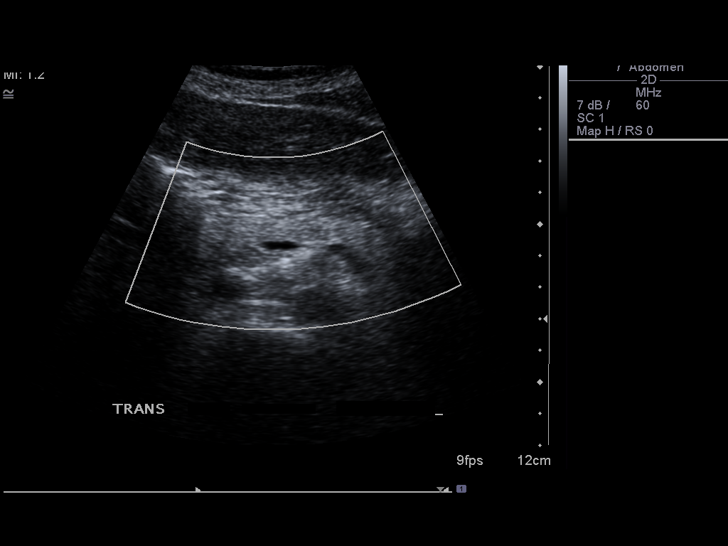
[im 81/81]
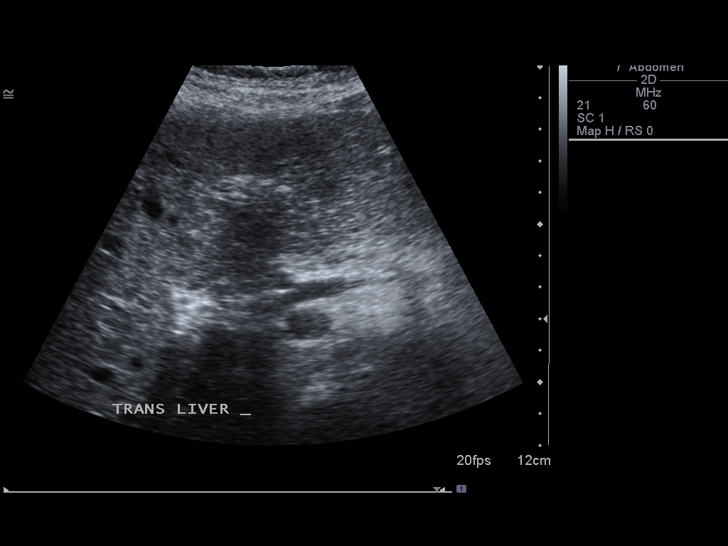

[13 of 25 positions shown; findings below may reference images not displayed]

FINDINGS: Gallbladder:  No gallstones.  No gallbladder wall thickening or
pericholecystic fluid. Negative sonographic Murphy's sign per the
ultrasound technologist.

Common bile duct: Normal in caliber.

Liver:  Normal size and echotexture.  No focal parenchymal
abnormalities.

Inferior vena cava:  Patent.

Pancreas:  Visualized portions unremarkable.

Spleen:  Normal size and echotexture without focal parenchymal
abnormalities.

Right kidney:  Surgically absent.

Left kidney:  Surgically absent.

Abdominal aorta:  Visualized portions normal in caliber,
unremarkable.

Transplant kidney in the right iliac fossa measures 9.8 cm in
length; no hydronephrosis or focal lesion.

There is a 3.2 x 5.2 x 5.4 cm complex cystic region noted in the
left lower abdomen consisting of several cystic areas with
intervening thick septations and a thickened wall.  This
corresponds to the left ovary identified on previous pelvic
ultrasound of 11/22/2006.
IMPRESSION: 1.  Normal gallbladder.
2.  Unremarkable transplant kidney.

## 2010-06-17 NOTE — H&P (Signed)
NAMEMarland Watkins  DANYIEL, GUERRERA NO.:  [id number]   MEDICAL RECORD NO.:  [id number]          PATIENT TYPE:  INP   LOCATION:  [id number]                         FACILITY:  [facility]   PHYSICIAN:  Dena Billet, MD     DATE OF BIRTH:  1965/09/28   DATE OF ADMISSION:  04/07/2008  DATE OF DISCHARGE:                              HISTORY & PHYSICAL   PRIMARY CARE PHYSICIAN:  Donato Heinz, M.D., nephrology.   CHIEF COMPLAINT:  Nausea, vomiting, abdominal pain.   HISTORY OF PRESENT ILLNESS:  This is a 45 year old African American  female patient with a past medical history significant for end-stage  renal disease, status post nephrectomy and kidney transplant, who had  been having nausea and vomiting for the past one year but had gotten  worse over the past two days, made the patient come to the ED for  further evaluation and management.  The patient states that the nausea  and vomiting had been going on for the past one year and had many tests  done but there is no diagnosis established as of now.  The patient  apparently had a gastric emptying scan which was inconclusive two days  ago and the patient was advised to get the scan done again.  The patient  states that however the nausea and vomiting is present the whole day and  for the past two days the patient is also having some right upper  quadrant and mid epigastric pain as well which had been constant.  The  patient is complaining of loss of appetite, weakness, lethargy.   The patient sometimes complains of shortness of breath but is not  complaining at the moment.  She denies any chest pain, fever, or chills.   REVIEW OF SYSTEMS:  As above.  The rest of the review of systems was  negative.   PAST MEDICAL HISTORY:  1. History of post Strep glomerulonephritis.  2. Kidney transplant x3.  The first one lasted for five years, the      second one failed, and the third one which was placed in the right      side in 2005, is still  functioning.  3. Status post nephrectomy.  4. Anemia.  5. Blindness.  6. Stroke.  7. The patient runs low blood pressure chronically.  8. GERD.  9. Chronic pain.  10.Insomnia.   PAST SURGICAL HISTORY:  1. Status post nephrectomy.  2. Status post kidney transplant.  3. Appendectomy.  4. Multiple cath replacements in the past.  5. The patient was also on hemodialysis in the past as well as      receives peritoneal dialysis.   OBJECTIVE:  She smokes half a pack per day for the past 14 years.  Occasional alcohol.  Former IV drug abuser.   FAMILY HISTORY:  Noncontributory to the present illness.  Positive for  hypertension.   ALLERGIES:  No known drug allergies.   MENSTRUAL HISTORY:  The patient's last menstrual period was April 07, 2008.   MEDICATIONS:  1. Prograf 1 mg p.o. b.i.d.  2. Imuran 50 mg two tabs at bedtime.  3. Prednisone 5 mg daily.  4. Tizanidine 4 mg p.r.n.  5. Omeprazole 40 mg p.o. b.i.d.  6. Zincate 250 mg p.o. daily.  7. Lasix 40 mg p.o. daily.  8. Aspirin 81 daily.  9. Oxycodone and acetaminophen one tab p.r.n.  10.Lunesta one tab at bedtime p.r.n.  11.Mucinex one tab p.r.n.  12.Ambien 10 mg p.r.n. at bedtime.  13.Zyrtec one tab p.r.n.  14.Tums one tab p.r.n.  15.Vitamin C 5 mg once daily.  16.Valtrex one tab p.r.n.  17.Aranesp 400 mcg every two weeks subcutaneously.  18.Hydrocodone/acetaminophen one tablet p.r.n.   PHYSICAL EXAMINATION:  VITAL SIGNS:  Temperature is 97.2, 200 rectally,  blood pressure is 90/50s to 60s, pulse is 60s to 80s, respirations 14-  24, blood pressure as mentioned earlier, pulse ox 95-100% on room air.  GENERAL:  The patient is awake, alert, and oriented x3.  Does not appear  to be in acute distress.  HEENT:  The pupils are equal, round, not reactive to light, especially  the right pupil.  The patient is blind in the right eye.  Oral mucosa is  dry.  The patient has mucosal pallor and conjunctival pallor present.  NECK:   Supple.  No JVD, no lymphadenopathy.  CARDIOVASCULAR:  S1/S2 regular.  No murmurs, heaves, or gallops.  CHEST:  Clear.  ABDOMEN:  Soft.  The patient has surgical scar marks present due to  nephrectomy and a renal transplant and also signs of peritoneal catheter  placement and a scar from appendectomy.  Minimal tenderness to deep  palpation in the mid epigastric and right upper quadrant area.  Bowel  sounds are present.  No hepatosplenomegaly.  EXTREMITIES:  Peripheral pulses present.  No clubbing, cyanosis, or  edema.  NEUROLOGIC:  Sensory and motor grossly intact.  Cranial nerves II-XII  are intact; no rashes.  MUSCULOSKELETAL:  Unremarkable.   LABORATORY DATA:  The patient's basic metabolic panel is within normal  range except for creatinine of 1.65 and BUN of 16.  The patient's  glucose was 68.  UA was negative except for UTI.  CBC showed H and H of  10.3 and 30.5.  Urine pregnancy test was negative.   IMPRESSION:  1. Nausea and vomiting with abdominal pain.  Questionable      gastroparesis, rule out cholelithiasis.  2. History of end-stage renal disease, status post renal transplant.      Stable at this point.  Will check Prograf level to make sure the      patient is not toxic with Prograf leading to these sinus symptoms,      although unlikely since it has been going on for quite some time.      Dehydration.  Continue IV fluids.  3. Gastroesophageal reflux disease.  4. Anemia.  5. Insomnia.  6. Chronic pain.   PLAN:  Will resume all the patient's home medications at this point.  Start the patient on Zofran 4 mg IV q.6.h. and Reglan 10 mg IV q.6 h,  standing dosages.  Check Prograf levels, CBC, CMP, amylase, lipase, in  a.m.  Check ultrasound of right upper quadrant.  Rule out  cholelithiasis.      Dena Billet, MD  Electronically Signed     NS/MEDQ  D:  04/07/2008  T:  04/07/2008  Job:  [id number]   cc:   Donato Heinz, M.D.  Fax: [phone]

## 2010-06-17 NOTE — Discharge Summary (Signed)
NAMEMarland Kitchen  Watkins, Tina NO.:  [id number]   MEDICAL RECORD NO.:  [id number]           PATIENT TYPE:   LOCATION:                                 FACILITY:   PHYSICIAN:  Kieth Brightly, MDDATE OF BIRTH:  1965/02/10   DATE OF ADMISSION:  DATE OF DISCHARGE:                               DISCHARGE SUMMARY   PRIMARY CARE PHYSICIAN:  Donato Heinz, MD   NEPHROLOGIST:  Donato Heinz, MD   REASON FOR ADMISSION:  Fever, right flank pain.   DISCHARGE DIAGNOSES:  1. Urinary tract infection.  2. Pyelonephritis in the transplanted kidney, resolved.  3. Status post renal transplant x3, currently having a baseline      creatinine between 1.6 to 2.0.  4. Previous end-stage renal disease, but currently working renal      transplant.  5. Right eye blindness.  6. Previous history of cerebrovascular accident.  7. Gastroesophageal reflux disease.  8. Chronic pain.  9. Insomnia.  10.Right middle lobe pneumonia.   DISCHARGE MEDICATIONS:  1. Vantin 200 mg p.o. b.i.d. for 7 days.  2. Azithromycin 500 mg p.o. daily for 2 days.  3. Tamiflu 75 mg p.o. daily for 2 days.  4. Procrit 30,000 units once every 2 weeks subcutaneously.  5. Vicodin 7.5/500 one tablet p.o. q.6 hourly p.r.n.  6. Prograf 3 mg in the a.m. and 2 mg at night.  7. Imuran 100 mg p.o. at bedtime.  8. Prednisone 5 mg p.o. daily.  9. Aspirin 81 mg p.o. daily.  10.Multivitamin 1 tablet daily.  11.Zinc 1 tablet daily.  12.Prilosec 1 tablet 20 mg p.o. daily.  13.Reglan 5 mg p.o. t.i.d. p.r.n.  14.Ambien 10 mg p.o. at bedtime p.r.n. for sleeplessness.  15.Tums 1 tablet chewable p.o. daily for bloating.   HOSPITAL COURSE:  1. UTI and pyelonephritis.  [name] is a 45 year old      female who has had 3 transplants, the first one lasted 5 years and      the second one did not start working at all, and currently with her      functioning third allograft which was placed in 2005.  Currently  allograft is working well and has baseline creatinine between 1.5      and 2.  She is being followed by Dr. Marval Regal as an outpatient.      She came to the emergency room for complaints of severe pain on the      allograft site.  She was evaluated and found to have urinary tract      infection and so was admitted with the fact to rule out a      pyelonephritis of the allograft.   An ultrasonogram was done, which did not reveal any kind of  recollection, hydronephrosis, or stranding suggestive of any  pyelonephritis in the allograft.  With IV antibiotics, her pain and  fevers came down.  She started feeling comfortable at that period.  In  view of the fact that she is the one with a precious kidney, a Renal  consult was called to evaluate her functionality of the kidney.  Renal  has seen the patient and suggested her to continue the medications  Imuran, tacrolimus, and prednisone at the same doses.  Therefore, the  patient's ferritin was checked, which was found to be high and iron was  low and so the Epogen dose has been increased to 30,000 units q.2 weekly  for anemia of chronic disease.   The patient has improved considerably.  She does not have any pain today  and so she has been discharged on few medications.   1. Middle lobe pneumonia.  At the time of admission, the patient had a      chest x-ray done, which showed left and right middle lobe      pneumonia.  She never had any cough or sputum production and there      were no clinical features of pneumonia.  In view of her      immunosuppressed state, to be more aggressive in managing her      issues, she was started on azithromycin in addition to the      ceftriaxone which was started for her UTI.  She will be completing      her course of azithromycin for a total of 5 days and after the      Dilantin which has been started as a p.o. pill, we will cover both      the pneumonia and the UTI.  2. Status post renal transplant with  baseline creatinine of 1.5 to 2 -      the patient will continue Prograf, Imuran, and prednisone for now.      Her renal function is currently stable and her urine output is      good.  3. Previous history of stroke.  Continue aspirin, currently no further      symptoms.  4. Anemia of chronic disease.  Continue Epogen 30,000 units q.2      weekly.   DISPOSITION:  Discharged back home.  The patient states she lives by  herself, and she is able to take care of herself at this time.   FOLLOWUP:  Follow up with Dr. Marval Regal in a week's time for further  check.   DISCHARGE INSTRUCTIONS:  1. Follow the diet as per plan.  2. If she experiences any further pain over the allograft, either call      her nephrologist or come to the emergency room immediately.   A total of 40 minutes spent on the discharge today.      Kieth Brightly, MD  Electronically Signed     UT/MEDQ  D:  08/25/2008  T:  08/26/2008  Job:  [id number]   cc:   Donato Heinz, M.D.

## 2010-06-17 NOTE — H&P (Signed)
Tina Watkins, SHELDEN NO.:  [id number]   MEDICAL RECORD NO.:  [id number]          PATIENT TYPE:  EMS   LOCATION:  MAJO                         FACILITY:  [facility]   PHYSICIAN:  Jeannett Senior, MD         DATE OF BIRTH:  Nov 07, 1965   DATE OF ADMISSION:  08/22/2008  DATE OF DISCHARGE:                              HISTORY & PHYSICAL   PRIMARY CARE PHYSICIAN:  Donato Heinz, M.D.   CHIEF COMPLAINT:  Fever, right flank pain.   HISTORY OF PRESENT ILLNESS:  The patient is a 45 year old African  American female with a past medical history of end-stage renal disease,  status post nephrectomy and kidney transplant.  The patient had kidney  transplant x3.  The first one lasted for five years, the second one  failed, and the third one was placed in 2005 which is still functioning.  The patient follows up with Dr. Donato Heinz.  She started having  fevers which started this morning.  T-max was 103.5 at home.  She also  had right flank pain which is radiating to the back.  She denies having  any burning micturition or dysuria.  There is no hematuria.  She denies  having any cough or chest pain.  There is no headache.  The patient  tried staying at home so that the fever would get better but it was  persistent and so she called her nephrologist's office and spoke with  the P.A.  She was recommended to come to the emergency room to get  evaluated and she is here.  In the emergency room the preliminary  investigations revealed nitrites positive with a large leukocyte  esterase, 21-50 wbc's in her urine.  Given her temperature, flank pain,  and UA she is suspected to have pyelonephritis and the ER physician  called [name] for further management.   REVIEW OF SYSTEMS:  A complete review of systems was done which included  general, head, eyes, ears, nose, throat, cardiovascular, respiratory,  GI, GU, endocrine, musculoskeletal, skin, neurologic, and psychiatric.  All are within  normal limits other than what is mentioned above.   PAST MEDICAL HISTORY:  1. End-stage renal disease.  2. Kidney transplant x3.  The first one lasted for five years, the      second one failed.  The third one is still functioning.  It was      placed in 2005.  3. Anemia.  4. Right eye blindness.  5. Stroke.  6. GERD.  7. Chronic pain.  8. Insomnia.   PAST SURGICAL HISTORY:  1. Status post nephrectomy.  2. Status post kidney transplant.  3. Appendectomy.   ALLERGIES:  None.   SOCIAL HISTORY:  There is history of tobacco abuse.  She has been  smoking for the past 14 years.  There is occasional alcohol consumption.  There is remote history of IV drug use.   FAMILY HISTORY:  Hypertension.   CURRENT MEDICATIONS AT HOME:  1. Prograf 1 mg p.o. b.i.d.  2. Imuran 50 mg two tablets p.o. at bedtime.  3. Prednisone 5 mg p.o. daily.  4. Omeprazole 40 mg p.o. b.i.d.  5. Zincate 2 mg p.o. daily.  6. Lasix 40 mg p.o. daily.  7. Aspirin 81 mg p.o. daily.  8. Zyrtec one tablet p.o. p.r.n.  9. Tums one tablet p.o. p.r.n.  10.Hydrocodone/acetaminophen one tablet p.o. q.4-6 h. p.r.n. pain.   PHYSICAL EXAMINATION:  VITAL SIGNS:  T-max 102.2, blood pressure 89/54,  pulse rate 86, respirations 18, O2 saturations 100% on room air.  GENERAL APPEARANCE:  Not in acute distress.  Alert, awake, oriented x3.  Currently afebrile.  HEENT:  Normocephalic, atraumatic.  Right eye ptosis and blindness.  Left eye pupil is round, reactive to light.  NECK:  Supple.  No JVD, lymphadenopathy, or carotid bruits.  CARDIOVASCULAR:  Regular rhythm, rate is normal.  No murmurs, rubs, or  gallops.  LUNGS:  Clear to auscultation bilaterally.  ABDOMEN:  Soft, nontender, nondistended.  No hepatosplenomegaly.  Right  CVA tenderness.  EXTREMITIES:  No clubbing, cyanosis, or edema.  NEUROLOGIC:  Grossly nonfocal.   LABORATORY DATA:  CBC with differential dated August 22, 2008:  WBC 7900,  hemoglobin 10.1, hematocrit  30, platelets 145,000.  INR 1.1, pro time  14.8.  Sodium 138, potassium 3.7, chloride 106, bicarb 25, BUN 21,  creatinine 2.1, blood glucose 95.  Total protein 6.5, albumin 3.7, ALT  12, AST 22, alkaline phosphatase 87.  Serum lactic acid 2.1.  Urinalysis:  The pH was 6, large amount of blood.  Large amount of  protein.  Nitrates are positive.  Leukocyte esterase is positive.  WBC  21-50.  Urine culture pending.   ASSESSMENT/PLAN:  1. Pyelonephritis in a transplanted kidney.  Not very uncommon.  Will      start the patient empirically on ceftriaxone 1 g IV q.24 h.  We are      still awaiting urine cultures.  Will change the antibiotics      appropriately.  Will monitor vital signs q.4 h.  Will consult her      nephrologist.  2. Community-acquired pneumonia.  The patient does not have any cough      but has a fever with radiographic changes.  Will start her on      empiric treatment with azithromycin and ceftriaxone.  Will monitor      her vitals.  3. End-stage renal disease, status post kidney transplant.  Will      continue her Prograf and azithromycin.  Will continue her      prednisone as well.  Will consult her nephrologist.  4. Gastroesophageal reflux disease.  Will continue her proton pump      inhibitors.  5. Insomnia.  Will continue Ambien.  6. Cerebrovascular accident.  Will continue aspirin.  7. DVT prophylaxis with SCDs.  8. Fluid, electrolytes and nutrition.  Will start normal saline at 100      ml an hour.  Will replace her electrolytes as needed.  She will      start on an AHA diet.   DISPOSITION:  Will admit the patient to the floor for parenteral  antibiotics.  Will consult nephrology.  Will image her if needed.      Jeannett Senior, MD  Electronically Signed     TA/MEDQ  D:  08/22/2008  T:  08/22/2008  Job:  [id number]

## 2010-06-17 NOTE — Consult Note (Signed)
NAMEAYAKO, LOFTY NO.:  [id number]   MEDICAL RECORD NO.:  [id number]          PATIENT TYPE:  OUT   LOCATION:  GYN                          FACILITY:  [facility]   PHYSICIAN:  Paola A. Alycia Rossetti, MD    DATE OF BIRTH:  08-20-1965   DATE OF CONSULTATION:  12/01/2006  DATE OF DISCHARGE:                                 CONSULTATION   The patient is a 45 year old who we initially saw in April 2006 as a  consult for multiple loculated fluid collections.  She was referred to  [name] by Kendall Flack.  At that time the patient gave a history of  multiple abdominal and pelvic surgeries, also had peritoneal dialysis in  the past.  We recommended no surgical intervention and expectant  management.  She has been doing quite well over the last 2-1/2 years.  She has had a week and half of complaining of some abdominal pain, some  nausea in the morning.  She has had emesis x2.  Today is a good day and  she is feeling fine.  The abdominal pain, she states, is worse in the  morning and then she has a bowel movement and the pain diminishes.  The  only change in her medications as that they increased her calcium and  decreased her Prograf within the past several months.  She states today  is a good day.  Despite some of the symptoms, she has not lost weight.  Her last period was a year ago in July 2007.  She has had no bleeding.  She states that when she gets her Aranesp shot she does not bleed but if  she does not get her Aranesp, she then has a menstrual cycle.  She did  have gonadotropins drawn in September that showed an elevated FSH at  greater than 40.  She was seen by Dr. Ronita Hipps and at that time an  ultrasound was obtained to evaluate for endometrial stripe thickness.  On the ultrasound report, it states that the patient had an 8.6 x 3.5 x  6.2-cm enlarged left kidney with multiple simple cysts in follicles.  I  think that this was actually a typographical error and they probably  meant to state that the patient had an enlarged left ovary.  The ovarian  size measures 6.1 x 5.4 with no other dimensions and with simple cysts.  The patient had a formal ultrasound done at [facility], which reveals a  10.1 x 5.3 x 4.3-cm uterus with a thin endometrial stripe.  She has a 1-  cm posterior uterine fibroid.  She has a left ovarian cyst which is  simple, measuring 3 x 2.6 x 2.5 cm.  It is for this reason that the  patient comes in.  She is otherwise well.  She denies any early satiety,  only nausea and vomiting as described above.  She denies any increase in  abdominal girth.   PHYSICAL EXAMINATION:  Weight 164 pounds, which is up 23 pounds from 2  years ago.  Well-nourished, well-developed female in no acute distress.  ABDOMEN:  Notable for multiple  surgical incisions.  Abdomen is  protuberant.  It is soft and nontender.  BIMANUAL EXAMINATION:  The cervix is palpably normal.  It is difficult  to ascertain the corpus.  It appears slightly globular and minimally  enlarged.  There are no obvious adnexal masses.  Exam is somewhat  limited by habitus.   ASSESSMENT:  A 45 year old with a 3-cm simple ovarian cyst.  The patient  has a history of peritoneal dialysis and has been evaluated for probable  peritoneal inclusion cysts in the past.   PLAN:  1. I would recommend that she have a follow-up ultrasound in 8 weeks      to evaluate this ovarian size.  She did have a gonadotropin that      was consistent with questionable menopausal status, and it is      unclear to me why she      does not have a period when she takes Aranesp injections.  2. She will follow up with other physicians for her other medical      issues.  I do think she is having some sensitivity-type issues to      her calcium, and that can be addressed with her primary physician.      Paola A. Alycia Rossetti, MD  Electronically Signed     PAG/MEDQ  D:  12/01/2006  T:  12/01/2006  Job:  [id number]   cc:   Caswell Corwin, R.N.  [address]  [place], [place] [address]   Lovenia Kim, M.D.  Fax: [phone]   Laurin Coder, MD

## 2010-06-20 NOTE — H&P (Signed)
NAME:  Tina Watkins, Tina Watkins NO.:  [id number]   MEDICAL RECORD NO.:  [id number]                   PATIENT TYPE:  EMS   LOCATION:  MAJO                                 FACILITY:  [facility]   PHYSICIAN:  Windy Kalata, M.D.          DATE OF BIRTH:  03/17/1965   DATE OF ADMISSION:  08/21/2002  DATE OF DISCHARGE:                                HISTORY & PHYSICAL   ADMISSION DIAGNOSIS:  Possible acute appendicitis.   HISTORY OF PRESENT ILLNESS:  The patient is a 45 year old black female who  attends hemodialysis at [facility] on Tuesdays, Thursdays and Saturdays  for end-stage renal disease, secondary to glomerular nephritis (since 1988).  She presents to the [employer] Emergency Department  with a 12-hour-history of nausea, vomiting, abdominal pain, accompanied by  one brown loose stool.  She had sea food at a [employer] with a  friend.  The only similar symptom he had was one loose stool.  She states  that the stool was brown.  She also states that she has had a headache and  clamminess before each vomiting episode.  The last several days she has felt  fine.  Interestingly enough, the patient complained of abdominal pain and  nausea on August 10, 2002, for which blood cultures x2 were drawn and Ancef 2 g  IV was given.  Blood cultures x2 were negative x five days.  She began her menstrual cycle about four days ago.  She has had an  uneventful menstruation without heavy bleeding, clots, or pain x 4-5 days.  She has had a period every 28 days.  She uses no contraception.  She has not  missed any periods.   PAST MEDICAL HISTORY:  1. Status post cadaveric transplant in 1989, with a rejection in 1992, in     [place].  2. Status post cadaveric transplant in 1999, with immediate rejection at     [facility].  3. Peritonitis in 2001.  4. 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  5. SBE in 1995.  6. History of TIA.  7. Chronic hypotension with low  cortisol (Dr. Parke Poisson. Gegick).  8. Chronic pain, left shoulder tendinitis with low back muscular pain.  9. Bilateral nephrectomy.  10.      Stress echocardiogram, Dr. Delrae Alfred B. Little, on February 23, 2001.   REVIEW OF SYSTEMS:  Denies sore throat, cough, head or chest cold, ear pain,  ear, nose, or eye drainage, black tarry stools, dysuria, or hematuria.  Positive for headache, dizziness and vomiting.   ALLERGIES:  No known drug allergies.   MEDICATIONS:  1. Renagel.  2. Forte ophthalmic solution.  3. Coumadin (for catheter patency).  4. Tums EXTREMITIES:  .  5. Cyclo ophthalmic solution #19.  6. Nephro-Vite.  7. Nortriptyline.   SOCIAL HISTORY:  Smokes 1/2 pack of cigarettes a day.  A remote history of  crack.  No living  children.  Did have an abortion many years ago.  Rarely  drinks alcohol.  Single and lives alone.   FAMILY HISTORY:  Father's medical history is unknown to her.  Mother's  medical history is unknown to her.  A brother's medical history is unknown  to her.   LABORATORY DATA:  In the [place] Emergency  Department the white count was 14.9, hemoglobin 13.6, hematocrit 41.1,  platelets 136, neutrophils 87%, lymphocytes 8%, monocytes 12%, eosinophils  1%.  Sodium 135, potassium 5.9, chloride 99, bicarbonate 24, BUN 41,  creatinine 12.3, glucose 110.  Calcium 9.4, albumin 3.7, alkaline  phosphatase 118, total bilirubin 0.7, total protein 7.1, amylase 134, lipase  24, AST 14, ALT less than 8.   PHYSICAL EXAMINATION:  VITAL SIGNS:  In the emergency department the blood  pressure was 103/26, heart rate 48, temperature 97.5 degrees, respirations  24, O2 saturation on room air 98%.  GENERAL:  An apparent uncomfortable black female alert and oriented x3,  lying on her left side.  HEART:  Irregular rate, a regular rhythm.  No clicks, gallops, or rubs.  LUNGS:  Clear to auscultation.  ABDOMEN:  Decreased bowel sounds.  Diffuse tenderness in  all four quadrants,  but greatest in the right lower quadrant.  Positive burning.  EXTREMITIES:  Trace edema pretibials, right greater than left.  Access left  thigh permanent catheter.  An emergency room abdominal ultrasound:  Questionable mass versus status  post transplant.  A CT of the abdomen and pelvis pending.   ASSESSMENT/PLAN:  1. Suspicious for acute appendicitis:  Will get a surgical consultation.  2. Anemia:  Will give Epogen 15,000 units IV q. Treatment.  3. Secondary hyperparathyroidism:  Calcitriol 1.5 mcg IV q. treatment.  4. Binders:  Renagel 800 mg p.o. q.i.d. a.c.  5. End-stage renal disease:  Will get one-hour hemodialysis treatment prior     to probable surgery.     Delray Alt, PA                   Windy Kalata, M.D.    MJG/MEDQ  D:  08/21/2002  T:  08/21/2002  Job:  [id number]

## 2010-06-20 NOTE — Discharge Summary (Signed)
NAME:  Tina Watkins, Tina Watkins                       ACCOUNT NO.:  [id number]   MEDICAL RECORD NO.:  [id number]                   PATIENT TYPE:  INP   LOCATION:  5524                                 FACILITY:  [facility]   PHYSICIAN:  Windy Kalata, M.D.          DATE OF BIRTH:  1966-01-12   DATE OF ADMISSION:  04/26/2003  DATE OF DISCHARGE:  04/29/2003                                 DISCHARGE SUMMARY   ADMISSION DIAGNOSES:  1. Chronic and profound hypotension.  2. End-stage renal disease on chronic hemodialysis.  3. Anemia of chronic disease.  4. Secondary hyperparathyroidism.  5. 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   DISCHARGE DIAGNOSES:  1. Chronic and profound hypertension, unclear etiology with negative workup,     now on midodrine.  2. End-stage renal disease on chronic hemodialysis with an increased dry     weight.  3. Anemia of chronic disease.  4. Secondary hyperparathyroidism.  5. 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 According to the kidney center,  her blood pressure was as low as 39/19, complaining of weakness. The  patient's normal systolic blood pressures range between 60 and 90. While at  short stay for _____________ catheter, she was given a liter of normal  saline with improvement in her blood pressure but still felt weak. Other  complaints are some lower abdominal pain but denies fevers, chills,  shortness of breath, blood stools or other obvious blood loss. She is  admitted now for workup of her hypotension.   LABORATORY DATA:  White count 5,900, hemoglobin 10.4, hematocrit 30.9,  platelets 229,000. Sodium 138, potassium 4.1, chloride 98, CO2 34, glucose  98, BUN 11,  creatinine 5.4, calcium 8.9, albumin 3.0. LFTs within normal  limits. CK 22, MB 0.8, troponin 0.01. TSH 1.137.   HOSPITAL COURSE:  Workup included blood cultures which remained negative.  Stool for occult blood was negative. Echocardiogram showed a small left  ventricle with systolic function to be vigorous. The left ventricular  ejection fraction was estimated between 65 and 75%, and there was no wall  motion abnormality seen of the left ventricle. There was increased thickness  of the pericardium but no effusion and no physiological tamponade seen.  Trivial pulmonic regurgitation, tricuspid regurgitation were seen. Mitral  valve appeared normal as well as the aortic valve which was trileaflet.  Midodrine was started at 10 mg three times a day and before dialysis. Her  dry weight was raised to 57 kg. At time of discharge, her blood pressure was  66/26, and the patient was completely asymptomatic and without orthostatic  changes.   The patient dialyzed one treatment in the hospital where 1.4  liters of  ultrafiltrate was removed. Lowest recorded blood pressure was at 49/23 which  she tolerated fairly well. Her post weight was 58.6 kg.   DISCHARGE MEDICATIONS:  1. Midodrine 10 mg three times a day and before dialysis.  2. Nephro-Vite vitamin one daily.  3. Renagel 800 mg three with meals.  4. Neurontin 300 mg at bedtime.  5. EPO 20,000 IV each dialysis.  6. Calcitriol 1.5 mcg IV each dialysis.  7. InFeD 50 mg IV every Thursday, new dry weight 57 kg.      Nonah Mattes, P.A.                      Windy Kalata, M.D.    RRK/MEDQ  D:  06/03/2003  T:  06/04/2003  Job:  [id number]   cc:   [facility]

## 2010-06-20 NOTE — Consult Note (Signed)
NAME:  Tina Watkins, Tina Watkins                       ACCOUNT NO.:  [id number]   MEDICAL RECORD NO.:  [id number]                   PATIENT TYPE:  INP   LOCATION:  6731                                 FACILITY:  [facility]   PHYSICIAN:  Edsel Petrin. Dalbert Batman, M.D.             DATE OF BIRTH:  1965-04-20   DATE OF CONSULTATION:  DATE OF DISCHARGE:                                   CONSULTATION   REASON FOR CONSULTATION:  Evaluate acute abdominal pain.   HISTORY OF PRESENT ILLNESS:  This is a 45 year old black female with end-  stage renal disease.  She presents to the emergency department with a 12-18  hour history of nausea and vomiting multiple times and centralized abdominal  pain.  She had one diarrheal stool but that is all.  Interestingly, she  states that two weeks ago she had some milder abdominal pain and was treated  with antibiotics after blood cultures were drawn and after a few days her  symptoms resolved and she felt well.   She came to the emergency room this morning because of severe pain which  began last night.   PAST HISTORY:  She has had two cadaveric renal transplants in the right  lower quadrant, one in [place] and one at [place].  They both have failed.  She  has undergone nephrectomies.  She has had peritoneal dialysis catheters in  the past which have been removed.  She states she has a history of  peritonitis.  She has a history of spontaneous bacterial endocarditis in  1995.  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  She has  a history of a TIA.  She has had a stress echo by Dr. Rex Kras one year ago.  She has a history of chronic hypotension with low cortisol followed by Dr.  Electa Sniff.  She has a history of a left femoral dialysis catheter placed seven  days ago.   CURRENT MEDICATIONS:  1. Renagel.  2. Forte ophthalmic solution.  3. Coumadin (she has not taken any in more than 7 or 8 days).  4. Tums.  5. Cyclogyl ophthalmic solution.  6. Nephro-Vite.  7. Nortriptyline.   ALLERGIES:  NONE KNOWN.   SOCIAL HISTORY:  The patient smokes one-half pack of cigarettes per day.  Remote history of crack cocaine.  No living children.  Rarely drinks  alcohol.  One abortion many years ago.  Single, lives alone.  Most of her  family lives in [place].  She states that she has not missed any  hemodialysis.   FAMILY HISTORY:  Documented on the chart and noncontributory.   REVIEW OF SYSTEMS:  Documented on the chart and noncontributory except as  described above.   PHYSICAL EXAMINATION:  GENERAL:  A young, short statured black female in  moderate distress.  She is cooperative and alert but appears somewhat  depressed.  VITAL SIGNS:  Oxygen saturation 98%  on room air, blood pressure 103/59,  temperature 97.9; heart rate 81, respiratory rate 24.  NECK:  No adenopathy.  LUNGS:  Clear anteriorly.  No chest wall tenderness.  HEART:  Irregular rate but a regular rhythm.  No gallops, rubs or murmurs.  ABDOMEN:  Bowel sounds are diminished.  She is soft without obvious  distention.  She is mostly tender in the lower abdomen, right lower quadrant  and suprapubic and left lower quadrant.  I do not feel a mass.  There are  multiple scars.  There are no hernias noted.  The liver and spleen are not  obviously enlarged.  EXTREMITIES:  She moves all four extremities well.  There is trace edema.  There is a left thigh venous dialysis catheter in place which looks  uninfected.   ADMISSION DATA:  A CT scan with rectal contrast and oral contrast is  consistent with appendicitis.  Dr. Loletta Parish states that the appendix is  thickened and there are inflammatory changes and that the right colon and  terminal ileum look fine.  There is no evidence of bowel obstruction or  abscess.   LABORATORY DATA:  Shows white count as 14,900, hemoglobin 13.6, potassium  5.9, sodium 135, carbon dioxide 24, glucose 110, BUN 41, creatinine 12.3.  Liver function tests are  normal.   ASSESSMENT:  1. Acute abdominal pain, most likely secondary to acute appendicitis.  I     cannot rule out other etiologies such as Crohn's disease, diverticulitis     or cancer but it seems less likely considering the CT scan findings.  2. End-stage renal disease.  3. Hyperkalemia.  4. Status post multiple abdominal operations with failed renal transplant.   PLAN:  1. The patient will be started on IV antibiotics.  2. She will get an acute hemodialysis for her hypokalemia.  3. Later on today, we will plan a laparoscopy and probable laparotomy for     her possible appendicitis.  She agrees with this plan.   I have discussed the indications and details of surgery with her.  The risks  and complications have been outlined, including but not limited to bleeding,  infection, conversion to open laparotomy, injury to adjacent organs,  postoperative bowel obstruction, wound problems such as infection or hernia,  and other unforeseen problems.  She understands these issues well.  At this  time, all of her questions were answered.  She agrees with this plan.                                               Edsel Petrin. Dalbert Batman, M.D.    HMI/MEDQ  D:  08/21/2002  T:  08/22/2002  Job:  [id number]

## 2010-06-20 NOTE — Op Note (Signed)
NAME:  Tina Watkins, Tina Watkins                       ACCOUNT NO.:  [id number]   MEDICAL RECORD NO.:  [id number]                   PATIENT TYPE:  INP   LOCATION:  6731                                 FACILITY:  [facility]   PHYSICIAN:  Edsel Petrin. Dalbert Batman, M.D.             DATE OF BIRTH:  [birth year]   DATE OF PROCEDURE:  08/21/2002  DATE OF DISCHARGE:                                 OPERATIVE REPORT   PREOPERATIVE DIAGNOSIS:  Acute appendicitis.   POSTOPERATIVE DIAGNOSES:  1. Acute appendicitis.  2. Left ovarian cyst.   OPERATION:  Open appendectomy.   SURGEON:  Edsel Petrin. Dalbert Batman, M.D.   FIRST ASSISTANT:  Timothy E. [name], M.D.   INDICATIONS FOR PROCEDURE:  This is a 45 year old black female with end-  stage renal disease, status post two failed cadaveric renal transplants,  status post bilateral nephrectomy, status post peritoneal dialysis catheter  with peritonitis currently on hemodialysis. She presented to the emergency  room with a 12-18 hour history of nausea, vomiting and centralized abdominal  pain. On exam, she had lower abdominal tenderness and guarding. Lab work  showed white blood cell count of 14,000. CT scan showed a thickened inflamed  appendix but no evidence of rupture or abscess. There is no evidence of  small bowel obstruction. She was brought to the operating room following  acute hemodialysis for exploration.   FINDINGS:  The patient had an acutely inflamed appendix but it was not  ruptured. The terminal ileum and cecum looked normal. The left ovary had a  large benign appearing cystic process in it about 4-5 cm in diameter. The  right ovary was normal. The uterus appeared normal in size. The left ovarian  cyst was somewhat adherent to the fundus of the uterus. Dr. [name] and I  examined this carefully. He felt that it looked completely benign and that  there did not appear to be any indication for emergent excision. There was  no evidence of bowel obstruction. There  were significant adhesions of the  omentum.   TECHNIQUE:  Following the induction of general endotracheal anesthesia, the  patient's abdomen was prepped and draped in a sterile fashion. A vertical  incision was made at the superior rim of the umbilicus. The fascia was  incised in the midline. I attempted to enter the abdominal cavity and I did  do so and found that there were extensive adhesions 360 degrees and I felt  that this was likely to be a diffuse process and so I abandoned the  laparoscopic approach. A lower midline incision was made extending the  incision around the umbilicus to the right and meeting up with the small  incision above the umbilicus. The fascia was incised in the midline. There  was moderate bleeding from the fascia which required a fair amount of  electrocautery. We entered the abdominal cavity and found that the omentum  was completely plastered to the under surface  of the abdominal wall. We  mobilized the omentum completely mobilizing it up into the wound. Some  omental bleeders had to be tied off with 2-0 silk ties for good control.   We were then able to explore the mid abdomen and pelvis fairly nicely with  findings as described above. We isolated the appendix and explored it with  the Babcock clamp. We divided the lateral peritoneal attachments to mobilize  it up into the wound a little bit better. The mesoappendix was isolated,  clamped, divided and ligated with 2-0 silk ties. We dissected the appendix  back to the cecum. A pursestring suture of 3-0 silk was placed  circumferentially around the appendix at the base of the cecum. The base of  the appendix was tied off with a 2-0 Chromic tie and the appendix amputated.  The stump of the appendix was then inverted and the pursestring suture of  silk was tied. This provided a very nice closure of the cecum and therefore  the sutures were required.   We spent a great deal of time irrigating the lower  abdomen, a few soft  tissue bleeders were controlled easily. We examined the uterus and ovaries  on more than one occasion and felt that the large benign appearing left  ovarian cyst should be left alone in this setting. The small bowel and  omentum were returned to their anatomic positions. The midline was closed  with a running suture of #1 Novofil and the skin was closed with skin  staples following saline irrigation. Clean bandages were placed and the  patient taken to the recovery room in stable condition. Estimated blood loss  was about 150 mL. Complications none. Sponge, needle and instrument counts  were correct.                                               Edsel Petrin. Dalbert Batman, M.D.    HMI/MEDQ  D:  08/21/2002  T:  08/22/2002  Job:  [id number]   cc:   Windy Kalata, M.D.  [address]  [place]  [place] [address]  Fax: [phone]

## 2010-06-20 NOTE — Op Note (Signed)
[facility]  Patient:    ARSHI, CHUC                    MRN: [id number] Proc. Date: 02/18/00 Adm. Date:  [id number] Disc. Date: [id number] Attending:  Alex Gardener                           Operative Report  PREOPERATIVE DIAGNOSIS:  End-stage renal failure.  POSTOPERATIVE DIAGNOSIS:  End-stage renal failure.  OPERATION PERFORMED: 1. Insertion of left femoral Ash dialysis catheter. 2. Removal of right femoral Ash dialysis catheter.  SURGEON:  Gordy Clement, M.D.  ASSISTANT:  Nurse.  ANESTHESIA:  Local with MAC.  ANESTHESIOLOGIST:  Crissie Sickles. Conrad Blain, M.D.  INDICATIONS FOR PROCEDURE:  The patient is a 45 year old female with end-stage renal failure maintained on hemodialysis with femoral Ash catheters.  The current indwelling right femoral Ash catheter is not functioning well.  She was brought to the operating room at this time for insertion of a left femoral Ash catheter and removal of the poorly functioning right femoral Ash catheter.  DESCRIPTION OF PROCEDURE:  Patient brought to the operating room in stable condition.  Placed in supine position.  Both groins prepped and draped in a sterile fashion.  Skin and subcutaneous tissue in the left groin instilled with 1% Xylocaine with epinephrine.  A needle was easily introduced into the left common femoral vein.  A guide wire was passed through the needle and under fluoroscopy into the inferior vena cava.  Subcutaneous tunnel then made in the left thigh.  The Ash catheter placed through the tunnel and brought up to the guide wire insertion site.  A 16 French dilator and tear-away sheath were then advanced over the guide wire and positioned in the inferior vena cava.  The catheter was then placed through the tear-away sheath and positioned in the inferior vena cava.  Tear-away sheath removed.  The catheter fixed to the skin with 2-0 silk suture.  The insertion site closed with  interrupted 3-0 nylon.  Catheter flushed with heparin saline solution and capped with heparin.  Sutures were removed then from the right femoral catheter.  Traction placed on the catheter and it was removed intact.  Pressure placed on the right groin and bleeding controlled.  There were no apparent complications.  Patient transferred to the PACU in stable condition. DD:  02/18/00 TD:  02/19/00 Job: [id number]

## 2010-06-20 NOTE — Discharge Summary (Signed)
[place]. [facility]  Patient:    Tina Watkins, BRAMLET Visit Number: [id number] MRN: [id number]          Service Type: Attending:  Sherril Croon, M.D. Dictated by:   Myriam Jacobson, P.A.C. Adm. Date:  11/04/00 Disc. Date: 11/09/00   CC:         Daryl Eastern, M.D., [facility]                           Discharge Summary  DISCHARGE DIAGNOSES: 1. 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 2. Multiple vascular access procedures and failed _____ catheter. 3. End-stage renal disease on chronic hemodialysis. 4. Secondary hyperparathyroidism. 5. Left wrist arthritis. 6. Anemia.  CONSULT:  Daryl Eastern, M.D.  PROCEDURES:  MRI of left wrist showed one marked inflammatory process, appears more inflammatory arthropathy given synovial hypertrophy and inflammation ________ arthropathy would be a possibility given a history of renal failure. Cannot totally exclude possibility of infection.  HISTORY OF PRESENT ILLNESS:  [name] is a 45 year old African-American female with end-stage renal disease for the past 15 years who is status post failed transplant in 1988 to 1992 and had a second transplant failure in 1999 at [facility].  The patient has history of multiple catheter access problems and has a malfunctioning catheter in her right groin. She presented to radiology at [facility] today for further evaluation.  The venogram showed a nonobtrusive clot in the inferior vena cava extending 12 cm to the renal vein.  The patient was transferred to [facility] and admitted for anticoagulation.  She has a history of functional uterine bleeding and there is concern as to what Coumadin therapy will do with this issue.  The remainder of the physical examination is outlined in the admission history and physical. The labs on transfer showed hemoglobin 10, hematocrit 29. This was  ISTAT, the next day the hemoglobin was 8.6.  Sodium 139, potassium 4.6.  These two were ISTAT.  HOSPITAL COURSE:  The patient was admitted to the renal unit, placed on usual medications and started on heparin drip with dosing of Coumadin and heparin by pharmacy.  The day prior, she received a new catheter in her groin placed by the radiologist at [facility] and was basically admitted for heparinization in order to get her Coumadin therapeutic.  While in the hospital, she complained of left wrist pain. Sed rate was done and was 15. Uric acid was also within normal limits.  X-ray of the left wrist showed no evidence of fracture dislocation or focal lesion.  MRI was done which showed results as described above. The patient was seen in consultation by Dr. Almira Bar. She basically at this time was just put on Celebrex which she had been taking prior to admission and will follow up with Dr. Almira Bar after discharge.  The patients hemoglobin remained in the 8s the remainder of her hospitalization.  Her Epogen dose was increased slightly.  Vaginal bleeding was not a significant problem despite being on Coumadin.  By November 09, 2000, her INR was therapeutic.  Her hemoglobin was stable at 8.4, hematocrit 26.4, platelets 237,000.  INR was 2.2.  Potassium was 5, sodium 136, chloride 106, CO2 22, glucose 83, BUN 36, creatinine 13.2, calcium 8.6, albumin 3, phosphorus 4.8.  She continued on Calcijex during her hospitalization for hyperparathyroidism.  She also had experience of chest pain  that was thought to be musculoskeletal.  Her EKG showed no changes.  The Celebrex she was already on was used for treatment. We may need to schedule a follow-up stress test as an outpatient. Having no further problems, the patient was discharged home on November 09, 2000.  Also of note, the patient was demanding of the need for personal care services to help her with cooking and cleaning because she had a  left wrist problem.  The patient is right-handed. The patient is quite ambulatory and knows how to work the system and she was deemed not a candidate for home health services at this time.  DISCHARGE MEDICATIONS: 1. Nephro-Vite one tablet per day. 2. Tums EX two tablets with meals three times a day. 3. Coumadin 5 mg one half tablet per day. 4. RenaGel 800 mg three tablets with meals three times a day. 5. Predforte ophthalmic solution one drop four times a day in right eye. 6. Celebrex 200 mg per day.  DISCHARGE DIET:  An 80 g protein, 2 g sodium, 2 g potassium diet.  Limit fluids to five cups per day.  DISCHARGE INSTRUCTIONS:  The patient was instructed to keep catheter dry.  Do no take shower.  She is to mobilize her wrist and keep it active according to Dr. Almira Bar.  DIALYSIS SCHEDULE:  As usual, Tuesday, Thursday, Saturday.  FOLLOW-UP:  She is to call Dr. Almira Bar for follow-up appointment.  DISCHARGE DIALYSIS INFORMATION:  InfeD 50 mg IV q. week.  Epogen 25,000 units IV q. hemodialysis.  Calcijex 1.5 mcg IV q. hemodialysis.  Estimated dry weight 51 kg.  The patient is to have a stat PT November 11, 2000. Dictated by:   Myriam Jacobson, P.A.C. Attending:  Sherril Croon, M.D. DD:  12/02/00 TD:  12/03/00 Job: [id number]

## 2010-06-20 NOTE — Consult Note (Signed)
[place]. [facility]  Patient:    Tina Watkins, Tina Watkins                    MRN: [id number] Adm. 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 This was diagnosed apparently in 1998.  She has had two transplants in the past with failures.  She apparently has been evaluated at [facility] for hypotension and adrenal gland disease, and no definitive diagnosis was made. She was found to have marked hypotension while she was in the hospital here, and serum cortisols were obtained.  Her serum cortisols were found to be 5.5 and then 2.7, and presumably after an ACTH stimulation test this increased to 11 and 17.8 respectively.  PAST MEDICAL HISTORY: Essentially that noted above.  She is currently admitted to the hospital with question of peritonitis.  PHYSICAL EXAMINATION:  GENERAL: This is a well-developed woman who appears somewhat with rounded facies, almost somewhat of a cushingoid appearance.  There is no evidence of increased skin pigmentation, although she is African-American with no evidence of hyperpigmentation.  LUNGS: Clear.  CARDIOVASCULAR: Rhythm regular.  IMPRESSION:  1. Abnormal cortisol level, rule out adrenal insufficiency.  DISCUSSION: The patient has a serum albumin of less than 1.5.  More than likely her cortisol binding globulin (CBG) is diminished.  At this time I will try to order a serum free cortisol level, and hopefully this can resolve this issue.  A urinary precortisol level would be out of the question since her kidneys have been removed.  More than likely she has normal adrenal function in view of her ACTH stimulation test.  Thank you for the opportunity of seeing this patient. DD:  06/09/99 TD:  06/09/99 Job:  [id number]

## 2010-06-20 NOTE — Discharge Summary (Signed)
[place]. [facility]  Patient:    Tina Watkins, Tina Watkins Visit Number: [id number] MRN: [id number]          Service Type: REC Location: [place] Attending Physician:  Mathews Argyle Dictated by:   Alben Deeds, M.D. Admit Date:  09/14/2000 Discharge Date: 12/13/2000                             Discharge Summary  CHIEF COMPLAINT:  Shortness of breath.  DISCHARGE DIAGNOSES: 1. Hyperkalemia secondary to missed dialysis. 2. Clotted left femoral Ash catheter, status post removal and replacement of    new left-sided Ash catheter on 10/14/00. 3. Status post temporary vas-cath placement on 10/13/00.  Status post removed    on 10/14/00.  DISCHARGE MEDICATIONS: 1. Nephro-Vite one tablet p.o. q.d. 2. Iron 325 mg one tab t.i.d. between meals. 3. Tums one tablet p.o. with meals. 4. Renagel home medication 800 mg three tablets with meals. 5. Tylenol 325 mg one or two tabs q.4-6h. p.r.n. pain.  HEMODIALYSIS:  The patient will report to [facility] on Saturday for her regularly scheduled hemodialysis.  The center has been contacted and made aware of her new catheter.  This information has been faxed.  The patient now has a new permanent Ash catheter placed in the left groin on 10/14/00.  PROCEDURES AND DIAGNOSTIC STUDIES:  Left Ash catheter exchange, IVC gram done on 10/14/00.  Findings:  Left Ash catheter pulled back and IVC gram performed. IVC widely patent.  Left Ash catheter exchanged over wire for new left Ash catheter tip left in mid IVC, ready to use.  CONSULTATIONS:  None.  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 PD catheter was removed secondary to medical noncompliance and vascular access difficulties.  She has clotted SVC, now femoral permanent catheter dependent. She  refuses leg graft.  She missed her hemodialysis on 10/12/00, and was scheduled for today, but her permanent catheter was clotted.  [facility] was unable to administer TPA, and her potassium was elevated at 7.4. She was transferred to [facility] where they placed a new right-sided vas-cath in the femoral without complications.  She reports some shortness of breath, but denies any chest pain.  ADMISSION LABORATORY DATA:  ISTAT potassium was 7.1.  HOSPITAL COURSE:  #1 - END-STAGE RENAL DISEASE:  The patient was admitted after placement of her new catheter with plans for discharging home on 10/13/00, after hemodialysis. The patient reported having bleeding from her catheter site, and was hemodynamically stable.  Her CBC was monitored, and her blood pressure was also monitored.  The patient had stopped bleeding from this right catheter site at 2:30 a.m.  Pressure was applied to the area and the bleeding stopped. The patients hemoglobin on the day of discharge was stable at 11.3.  She was discharged with followup at [facility] at her regularly scheduled hemodialysis day.  #2 - HYPERKALEMIA:  Resolved.  Potassium 4.7.  This will be followed at the outpatient dialysis center.  #3 - HISTORY OF NONCOMPLIANCE WITH HEMODIALYSIS: Dictated by:   Alben Deeds, M.D. Attending Physician:  Mathews Argyle DD:  12/13/00 TD:  12/14/00 Job: [id number]

## 2010-06-20 NOTE — Op Note (Signed)
[place]. [facility]  Patient:    Tina Watkins, Tina Watkins                    MRN: [id number] Proc. Date: 06/10/99 Adm. Date:  [id number] Attending:  Lance Sell CC:         Judeth Cornfield. Scot Dock, M.D.                           Operative Report  PREOPERATIVE DIAGNOSIS:  Chronic renal failure.  POSTOPERATIVE DIAGNOSIS:  Chronic renal failure.  OPERATIONS: 1. Placement of left femoral Ash catheter. 2. Removal of right femoral Flexicon catheter.  SURGEON:  Judeth Cornfield. Scot Dock, M.D.  ANESTHESIA:  General.  DESCRIPTION OF PROCEDURE:  The patient was taken to the operating room and received general anesthetic.  Both groins were prepped and draped in the usual sterile fashion.  The left femoral vein was cannulated and the guide wire introduced into the inferior vena cava under fluoroscopic control.  The exit site for the catheter as chosen and a 28 cm Ash catheter was tunnelled between the two incisions.  The tract over the wire was then dilated and then dilator and peel-away sheath were passed over the wire and the wire and dilator were removed.  The catheter was passed through the peel-away sheath and was positioned in the distal inferior vena cava.  Both ports withdrew easily.  They were then flushed  with heparinized saline and filled with concentrated heparin.  Of note, I did inject contrast to demonstrate the position of the catheter in the cava.  Next, the catheter was secured at the exit site with three 2-0 nylon sutures. he femoral cannulation site was closed with a 4-0 subcuticular stitch.  Next, the right femoral catheter was removed and pressure held for hemostasis.  Sterile dressings were applied.  The patient tolerated the procedure well and was transferred to the recovery room in satisfactory condition.  All needle and sponge counts were correct. DD:  06/10/99 TD:  06/11/99 Job: [id number]

## 2010-06-20 NOTE — Consult Note (Signed)
NAMEJONNE, Tina Watkins NO.:  [id number]   MEDICAL RECORD NO.:  [id number]          PATIENT TYPE:  OUT   LOCATION:  GYN                          FACILITY:  [facility]   PHYSICIAN:  Tina A. Alycia Rossetti, MD    DATE OF BIRTH:  1965-09-20   DATE OF CONSULTATION:  05/13/2004  DATE OF DISCHARGE:                                   CONSULTATION   REFERRING PHYSICIAN:  Kendall Watkins.   The patient seen today in request of Tina Watkins, nurse practitioner. [name] is a 45 year old gravida 1 para 1 whose last cycle was April 15, 2004. She has recently undergone her third kidney transplant August 13, 2003.  She was on Depo-Provera starting in June 2005 and received her last  injection in March. She states that the Depo was given to her for control of  her bleeding but she is beginning to have the irregular bleeding that can go  along with Depo-Provera. She has been having some hot flashes since November  2005. She does have a history of chronic renal failure and renal  insufficiency secondary to a strep infection at the age of 24. She initially  had hemodialysis followed by a prolonged period of peritoneal dialysis,  hemodialysis, and then again transplant, and she most recently had her third  transplant in July 2005. She was referred to [name] as she had had a CT scan of  the abdomen and pelvis performed in February 2006. This was compared to a  pelvic ultrasound in March 2005 and a CT scan of the pelvis in July 2004.  The abdomen is otherwise unremarkable. Within the pelvis she had multiple  fluid collections. One fluid collection measured 4.3 x 3.5 cm in the low  pelvis. It was decreased from a 5.5 x 5.1 cm fluid collection. There were  new fluid collections - one superiorly in the upper pelvis which measures  7.4 x 3.8 cm, and a second one dependent in the pelvis measuring 6.1 x 2.5  cm. It was well defined walls. There were no nodules. The uterus was  unremarkable without  changes. There is no mention of ovarian pathology. She  subsequently underwent a transvaginal ultrasound May 09, 2004. There was a  fluid collection in the midline inferior to the uterus and the bladder which  measured 5.6 x 8.5 x 9.8 cm which was slightly larger than the CT scan. The  previous noted collections that were more inferior in the pelvis and cul-de-  sac were not appreciated on the exam. There was no comment regarding her  ovaries and she had a stable appearance of her right pelvic transplant.   ALLERGIES:  None.   MEDICATIONS:  Prograf, Myfortic, Bactrim, Aranesp, oyster calcium,  calcitriol, Lipitor, iron, zinc, cortisone, prednisone, Protonix, Lasix,  Tums, calcium, cyclobenzaprine, Ambien, and multiple pain medications.   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   PAST SURGICAL HISTORY:  Renal transplant x3, appendectomy, multiple  peritoneal hemodialysis catheters.   SOCIAL HISTORY:  She is single. She smokes a half a pack per day as she has  done for about 20 years. She drinks alcohol rarely. 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   FAMILY HISTORY:  Significant for sinus issues and glaucoma.   PHYSICAL EXAMINATION:  VITAL SIGNS:  Height 4 feet 11 inches, weight 141  pounds, blood pressure 128/72, pulse 104, respirations 18.  GENERAL:  Well-nourished, well-developed female in no acute distress.  ABDOMEN:  Notable for multiple well-healed surgical incisions. She had a  kidney palpable in the right lower quadrant. There is no fluid wave. Abdomen  is soft and nontender.  PELVIC:  Bimanual examination:  The cervix is palpably normal. There is a  kidney palpable in the right lower quadrant. I cannot appreciate any  distinct masses. The right adnexa cannot be appreciated secondary to  her  renal transplant. The left adnexa is not markedly enlarged.   ASSESSMENT:  A 45 year old with history of multiple abdominal and pelvic  surgeries who has also undergone peritoneal dialysis in the past who has  fluid collections that appear to be changing, getting, smaller and larger  intermittently as she has been evaluated with radiographic studies. I  believe these are most likely consistent with peritoneal inclusion cysts  which are very common in patients who have had multiple abdominal surgeries  and very common in patients who have had peritoneal dialysis. At this time I  would not recommend surgical intervention. I do not feel that this is  consistent with a malignant process. I do not think that a CA-125 would be  helpful. At this point I would treat her symptomatically. If she develops  pain or abdominal bloating, then I would proceed with imaging studies and  consider percutaneous drainage of an area if she is symptomatic from it.  Otherwise, I would follow her expectantly. She has an appointment with Ms.  Tina Watkins, her nurse practitioner, tomorrow for her routine GYN care. I have  asked her to consider the options of other modes for cycle control as I do  not believe Depo-Provera is the best mechanism for her to achieve cycle  control.   She was given a copy of our card. She knows I will be happy to see her on a  p.r.n. basis in the future.      PAG/MEDQ  D:  05/13/2004  T:  05/13/2004  Job:  [id number]

## 2010-06-20 NOTE — Op Note (Signed)
[place]. [facility]  Patient:    Tina Watkins, Tina Watkins                    MRN: [id number] Proc. Date: 07/10/99 Adm. Date:  [id number] Disc. Date: [id number] Attending:  Nolon Bussing                           Operative Report  PREOPERATIVE DIAGNOSES: 1. Neovascular glaucoma of the right eye. 2. Dense nuclear sclerotic cataract of the right eye. 3. Vitreous hemorrhage of the right eye, moderate secondary to nondiabetic    proliferative retinopathy. 4. 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  POSTOPERATIVE DIAGNOSES: 1. Neovascular glaucoma of the right eye. 2. Dense nuclear sclerotic cataract of the right eye. 3. Vitreous hemorrhage of the right eye, moderate secondary to nondiabetic    proliferative retinopathy. 4. 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  PROCEDURES: 1. Posterior vitrectomy with Endolaser panretinal photocoagulation. 2. Phacofragmentation and lensectomy via the pars plana with insertion    secondary to the posterior chamber intraocular lens into the sulcus. 3. Glaucoma seton - aqueous shunt for bypass using the Baerveldt pars plana    insertion implant.  The posterior chamber intraocular lens was a model    EZE-60, power +20.5, length 12.75, serial number [id number], and the glaucoma    seton is serial #[id number], type is pharmacy electron.  SURGEON:  Nolon Bussing, M.D.  ANESTHESIA:  General endotracheal.  INDICATIONS FOR PROCEDURE:  The patient is an 45 year old woman who has had profound vision loss on the basis of retinal artery occlusion of the right eye despite extensive panretinal photocoagulation from two weeks previously.  Has had progressive loss of vision, pain, and corneal edema today with mild progression of nuclear sclerotic cataract, and evidence of  neovascular glaucoma progression of the right eye on the basis of nonperfusion.  The patient understands that this is heroic surgical attempt to control the intraocular pressure surgically.  She understands that this is an attempt to further provide posterior photocoagulation via vitrectomy, but also to remove the ocular media opacities consisting of corneal edema without lowering the intraocular pressure, but also the cataracts, as well as maintaining lowered intraocular pressure via glaucoma seton implant.  She understands the grave nature of her condition, as well as need for surgical intervention, as well as the poor prognosis of visual acuity outcome, even with surgery, but certainly ______ and complete total loss of vision without surgical intervention.  She understands these issues and wishes to proceed.  She understands the risks included, but not limited to general anesthesia including the risks of the death, but also to the eye including hemorrhage, infection, scarring, need for further surgery, no change in vision, loss of vision, and progression of disease despite intervention.  DESCRIPTION OF PROCEDURE:  After appropriate signed consent was obtained, the patient was taken to the operating room.  In the operating room, general endotracheal anesthesia was instituted without difficulty.  The right periocular region was sterilely prepped and draped in the usual ophthalmic fashion.  A lid speculum was applied.  Conjunctival peritomy fashioned approximately 210 degrees and supranasally to supertemporal in the inferotemporal quadrant.  A 4 mm infusion was secured 3.5 mm posterior to the limbus in the inferotemporal quadrant.  Placement in the vitreous cavity was verified visually.  Superior sclerotomies were then fashioned.  A Wild microscope was placed in position with a Biom attachment.  Care was taken to place the supertemporal sclerotomy in the midportion of the quadrant.  At  this time, the anterior chamber was deepened with Provisc because it was somewhat cloudy in nature.  The intraocular pressure was lowering and the cornea cleared nicely.  An opening in the posterior capsule was made with vitrectomy instrumentation and using a combination of phacofragmentation as well as suction through the vitrectomy.  The lens was aspirated without difficulty.  The anterior capsule was preserved entirely.  At this time, excellent visualization of the posterior segment could now be obtained.  Core vitrectomy was then done, as well as followed by iatrogenic posterior vitreous attachment and vitreous elevation 360 degrees.  At this time, peripheral retina was inspected and found to be free of retinal holes or tears.  At this time, panretinal photocoagulation was then carried out 360 degrees in a confluent pattern.  At this time, hemostasis was spontaneous.  The decision was made to place the posterior chamber intraocular lens.  A grooved limbal incision was then fashioned superiorly.  The anterior chamber was opened with the MVR blade. The posterior chamber intraocular lens was placed in the sulcus and rotated into the horizontal position.  The limbal wound was irrigated with 10-0 nylon suture after flushing of the Provisc from the anterior chamber completely. Appropriate tension was applied to the wound and the wound was secure.  At this time, the Baerveldt implant was then screwed in the supertemporal quadrant after isolation of the superior rectus and the lateral rectus with muscle hooks.  The wings of the implant were placed under each of these rectus muscles, and then the insertion into pars plana was then carried out.  Prior to this, care was taken to assure free flow of fluid from the intraocular cavity, and then the peripheral vitreous in this area was clear.  This was done without difficulty.  The implant was secured and tied with four individual 8-0 nylon  sutures with the appropriate eyelets.  A moderately tight 7-0 Vicryl suture was placed and tied around the tube to control the intraocular pressure in the immediate postoperative period.   At this time, conjunctiva and Tenons were closed over this region.  The conjunctiva was closed in an interrupted fashion in a watertight fashion.  The limbal edge was then also closed with a vertical mattress with a 7-0 Vicryl. Subconjunctival injection with antibiotic and steroid were applied.  The patient was taken to the recovery room in good and stable condition after tolerating the procedure well without complication. DD:  07/10/99 TD:  07/15/99 Job: [id number]

## 2010-06-20 NOTE — Discharge Summary (Signed)
[place]. [facility]  Patient:    Tina Watkins, Tina Watkins                    MRN: [id number] Adm. Date:  [id number] Disc. Date: [id number] Attending:  Nolon Bussing Dictator:   Oda Kilts, M.D. CC:         [facility]                           Discharge Summary  DISCHARGE DIAGNOSES:  1. Nausea and vomiting, hypoglycemia which may have been secondary to     narcotic withdrawal.  2. End-stage renal disease requiring chronic ambulatory peritoneal dialysis.  3. Hypokalemia.  4. Chronic hypotension.  5. Chronic pain.  DISCHARGE MEDICATIONS:  1. Folic acid 1 mg p.o. q.d.  2. Iron sulfate 325 mg p.o. q.d.  3. Nephro-Vite 1 p.o. q.d.  4. Epogen 20,000 units but q 3 times a week.  5. Renagel 800 mg p.o. t.i.d. q.a.c.  6. K-Dur 20 mEq p.o. b.i.d.  7. Elavil 10 mg p.o. q.h.s.  8. Tylenol 650 mg p.o. q.4h. p.r.n.  9. Benadryl 25 mg p.o. q.6h. p.r.n.  CONSULTATIONS:  None.  PROCEDURES:  CT of the chest, abdomen and pelvis with contrast media on May 02, 1999.  The study demonstrated a large amount of dialysate fluid within the peritoneal cavity especially on the right side diaphragmatic region.  There is also a large amount of dialysate in the pelvis.  CHIEF COMPLAINT:  Decrease in mental status, nausea and vomiting, and restlessness.  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  The patient is currently on peritoneal dialysis, and recently transferred her medical care to the [place] [facility].  The patient had been previously followed at [facility] in [place].  The patient has chronic hypotension and chronic pain for which Tina Watkins has previously been on methadone.  The family called EMS on the evening of admission for decreased in mental status  for a couple of hours and a 2-3 day history of nausea and vomiting, and restlessness.  On presentation to the emergency department the patient was found to be agitated with a blood sugar of 35.  Tina Watkins received IM glucagon by EMS, and was transported to the emergency room.  In the emergency room the patient was markedly agitated and vomited several times.  Her blood pressure was originally 120/112 with a heart rate of 110.  Initial examination by the emergency department physician showed cyanotic nail beds and there was concern due to the patients complaints of diffuse pain in the chest, back and abdomen of pulmonary embolus.  The patient was scheduled originally for emergency spiral CT of the chest and required  10 mg of IV morphine for sedation. The CT scan was negative for PE, and an abdominal CT Scan showed large amounts of dialysate within both the peritoneal cavities and the pelvis.  On her return from the emergency room, the patient became more lethargic, and hypotensive and her blood pressure dropped into the 40s, and the patient became presyncopal.  The patient was initially started on Dopamine as suppressor support but this was discontinued after approximately 30 minutes.  After the patient was given Narcan her mental status improved and Tina Watkins was able to provide some  medical history.  Tina Watkins reported that Tina Watkins had been on methadone for approximately 2 years and there is other narcotic pain medications including Percocet, Roxicet, Tylenol #3, and others as prescribed at [facility].  Tina Watkins also said that Tina Watkins had been seen at [facility] at the Pain [facility].  The patient had reported that Tina Watkins had run out of her pain medication a couple of weeks ago and that Tina Watkins could not get any more from her previous physicians.  Tina Watkins reported that Tina Watkins did not need these pain medications every day.  After the Narcan and the short lived dopamine drip the patients blood pressure stabilized and Tina Watkins was less  agitated.  The patient mental status returned to normal and other than continuing to be somewhat nauseous the patient appeared to be stable for transfer to a medical bed.  PAST MEDICAL HISTORY:  1. 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  2. Chronic pain.  3. Chronic hypotension.  4. 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  5. Status post bilateral nephrectomy.  6. Heavy tobacco use since the age of 38.  7. History of SBE in 4.  8. Anemia.  9. Miscarriage two months prior to admission. 10. History of mini-strokes over a year ago.  MEDICATIONS:  1. Nephrocaps.  2. Folic acid.  3. Epogen 20,000 units TRW  4. Renagel 100 mg p.o. t.i.d. q.a.c.  5. Methadone 5 mg p.r.n.  6. Percocet p.r.n.  ALLERGIES:  No known drug allergies.  SOCIAL HISTORY:  The patient is single but lives with a female friend who Tina Watkins has living with for quite some time.  Tina Watkins denies having any children. Tobacco use positive.   No drugs or alcohol use.  REVIEW OF SYSTEMS:  As dictated in original history and physical examination report.  PHYSICAL EXAMINATION:  VITAL SIGNS:  Temperature 99.4 rectally, blood pressure 150 off of dopamine. Heart rate 100, respiratory rate 18.  GENERAL:  Tina Watkins is a thin African American female who calmed down post Narcan and transient dopamine drip.  NECK:  Supple with no JVD, or meningismus.  CHEST:  Clear to auscultation bilaterally.  HEART:  Regular rate and rhythm, there is normal S1, S2.  There is a ______ systolic ejection murmur at the lower sternal border.  ABDOMEN:  Soft, nontender, nondistended, with normoactive bowel sounds and a clean exit site for the Tenckhoff catheter.  EXTREMITIES:  No peripheral edema.  Old grafts in the arms and no joint effusions.  NEUROLOGIC:  Alert, oriented x 3.  No  focal weaknesses.  LABORATORY DATA:  On admission sodium 132, potassium 2.6, CO2 28, BUN 33,  creatinine 12, glucose 75, hemoglobin 11.3 with an MCV of 39.4, white blood cell 14.0, with 77% neutrophils.  Platelets 239, PT 14.9, INR 1.3, PTT 42.  EKG showed sinus tachycardia at 104 with Q waves in 2, 3, and AVF and V4 through V6 with no acute ST-T wave changes.  CT of the chest, abdomen and pelvis was negative for evidence of pulmonary embolism and showed large amounts of dialysate fluid in the peritoneal cavity and in the pelvis.  ASSESSMENT:  Tina Watkins is a 45 year old African American female with end-stage renal disease secondary to chronic glomerulonephritis status post failed renal transplantation x 2 who is currently on CAPD.  Tina Watkins presents with hypoglycemia, nausea  and vomiting, agitation and transient hypoextension with a differential of sepsis versus peritonitis, versus narcotic withdrawal.  Once stabilized the patient is to be transferred to telemetry unit for further monitoring with blood cultures and empiric antibiotics.  HOSPITAL COURSE:  The patient was admitted to [facility] under the direction of Donahue.  Tina Watkins was initially started on Zosyn for possible sepsis and was later switched to Augmentin without incident.  The patients hypoglycemia improved status post IV glucose and IM glucagon and the patients blood sugars began to stabilize.  The patient continued to have some nausea but was able to tolerate liquids on the day after admission and her diet was advanced as tolerated.  The patient was continued on her CAPD throughout her hospitalization.  Analysis of peritoneal fluid was negative for evidence of peritonitis.  The patients hypokalemia was treated as needed.  Throughout the course of the patients hospitalization Tina Watkins became intermittently hostile with the nursing staff and complained of pain.  Tina Watkins did develop some dysmenorrhea as  related to her menstrual period and was given non-narcotic analgesics, for these complaints.  On May 06, 1999, the patient continued to complain of pain and reported that Tina Watkins wanted more potent pain medications.  After being advised that Tina Watkins would not be prescribed any narcotic agents the patient requested to be discharged from the hospital.  AS the patient was stable for discharge, her request was fulfilled. The patient was advised to return to the pain clinic that Tina Watkins had been previously seen at for further management of her chronic pain syndrome.  DISCHARGE DIET:  The patient is to follow a 70 gram protein diet. Tina Watkins has been instructed in the past to follow a high sodium, high potassium diet as Tina Watkins typically is hypokalemic and tends to be hypotensive.  CAPD:  The patient is to use 4 ______ per day at 2.5 liters percutaneous.  SPECIAL INSTRUCTIONS:  The patient has been instructed to stop smoking.  DISCHARGE LABORATORY DATA:  Sodium 135, potassium 4.8, chloride 98, CO2 28, BUN 23, creatinine 9.7, calcium 9.8, glucose 77.  White blood cells 7.1, hemoglobin 9.2, MCV 80.2, platelets 207, cortisol 6.2.  FOLLOWUP:  The patient is to followup with Dr. Erling Cruz on May 27, 1999, at 8:30 a.m. DD:  07/28/99 TD:  07/28/99 Job: [id number]

## 2010-06-20 NOTE — H&P (Signed)
[place]. [facility]  Patient:    Tina Watkins, Tina Watkins                     MRN: [id number] Adm. Date:  05/02/99 Attending:  Roney Jaffe, M.D.                         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  She is currently on peritoneal dialysis and recently transferred her care to the [place].  She was previously followed at [facility] in [place].  She has chronic hypotension and chronic pain. Family called EMS earlier this evening for a decrease in mental status for a couple of  hours and a two- to three-day history of nausea and vomiting and restlessness. On presentation, the patient was found to be agitated with a blood sugar of 35. She received IM glucagon by EMS and was transported to the emergency room.  In the emergency room, the patient was markedly agitated with some wretching and vomiting. Blood pressure was 120/112 with a heart rate of 110.  Initial examination by the emergency room physician showed cyanotic nailbeds and there was some concern due to the patients complaints of diffuse pain in chest, back, and abdomen of pulmonary embolus.  She was scheduled for emergency spiral CT of the chest and required 10 mg of IV morphine for sedation.  The CT scan was completed which was negative for E and an abdominal CT scan showed ascites.  On return from the emergency room, the patient became progressively more lethargic and hypotensive.  There was no further agitation since given morphine.  Blood pressure dropped into the 40s and the patient was near syncopal when she was given Narcan with a sudden return to her  previous agitated state with diffuse pain, emesis, and asking for pain medicine  such as Demerol.  She then provided a history that she  had been on methadone for two years and various other narcotic pain medications including Percocet, Roxicet, Tylenol No. 3 and others for diffuse pain for some time at [facility]. She said that she ran out a couple weeks ago and "they wouldnt give me anymore." She told me that she does not need them every day and she has taken the one bottle f methadone over a two-year period but on presentation told the emergency room nurse that she uses pain medicines daily.  After the Narcan and transient dopamine drip, the patient stabilized and became  less agitated and blood pressure stabilized in the 80/40 range.  The mental status was back to normal and the patient other than her feeling miserable had no acute complaints but was mainly reporting pain all over similar to her chronic pain syndrome.  PAST MEDICAL HISTORY:  1. ESRD due to post-streptococcal GN in 1988 with cadaveric renal transplant at     [place] [facility] [place] and a second transplant in 1999     with primary nonfunction - also [facility].  Multiple prior hemodialysis accesses and     currently on peritoneal dialysis.  2. Chronic pain with details sketchy.  3. Chronic hypotension with normal blood pressure running from 50 to 80 systolic.  4. History of iritis.  Blind in the right eye.  Followed by Dr. Deloria Lair.  5. Status post bilateral nephrectomy.  6. Heavy tobacco use since age 58.  45. History of SBE in 1995 without recurrence.  8. Anemia - described in #5.  9. Miscarriage two months ago. 10. History of "mini strokes" over a year ago.  MEDICATIONS:  Uncertain of - outpatient office lists Nephrocaps, folic acid, Epo 0000000 units three times weekly, and Renagel one with meals 800 mg.  She also states that she takes methadone 5 mg a day as needed as well as Percocet and other pain medications when she has them.  ALLERGIES:  No known drug allergies.  SOCIAL HISTORY:  The patient is single  but lives a female friend who she has been  with for quite some time.  She denies having any children.  She almost primarily has been in the [place] area but she has intermittently been back in [place] which is apparently where she grew up.  HABITS:  Tobacco use.  No drugs or alcohol.  REVIEW OF SYSTEMS:  GENERAL: Right frontal periorbital headaches off and on for the past two months.  No weight loss, fevers, chills, or sweats.  ENT: No visual changes.  Difficulty hearing.  She has no difficulty swallowing or epistaxis. CHEST: Some shortness of breath with this presentation, no hemoptysis or productive cough, or history of pneumonia.  CARDIAC: No history of MI or angina in the past. GI: No hematemesis or history of GI bleeding or ulcers.  MUSCULOSKELETAL: History of arthralgias.  Known arthritis.  NEUROLOGIC: Mini strokes one year ago in [place] and details unavailable with transient blindness and weakness.  PHYSICAL EXAMINATION:  VITALS:  Blood pressure currently 100/50 off dopamine, heart rate 100, respirations 18, temperature 99.4 rectal.  GENERAL:  She is an alert, thin, African-American female who became very agitated after Narcan was given.  She has now calmed down quite nicely.  NECK:  No JVD or meningismus.  CHEST:  Clear to auscultation bilaterally.  CARDIAC:  Regular rate and rhythm without rub or gallop.  There is a 2/6 systolic ejection murmur at the lower sternal border of abdomen, soft, nontender, nondistended with active bowel sounds and a clean exit site for the Tenckhoff catheter.  EXTREMITIES:  No peripheral edema.  Old grafts in the arms and no joint effusions.  NEUROLOGIC:  Alert and oriented x 3 currently.  No focal weakness.  LABORATORY:  ______ sodium 132, potassium 2.6, CO2 28, BUN 33, creatinine 12, glucose 75.  Hematocrit 29 and hemoglobin 10.  pH 7.51, pCO2 34.  EKG shows sinus tachycardia at 104 with Q waves in 2, 3, F, and  V4-V6 with no acute ST-T wave changes.  No old EKG for comparison.  Spiral chest CT scan.  No PE.  Abdominal CT scan - ascites.  IMPRESSION:  1. Hypoglycemia, nausea, vomiting, and agitation for about two days duration and    chronic pain patient after recently running out of pain medications. Suspect    opiate withdrawal as main problem here.  Symptoms resolve completely with    morphine given prior to CT scan and then recurred after Narcan given due to n    abrupt deterioration from the morphine.  Also consider sepsis peritonitis, etc. 2. Chronic hypotension. 3. ESRD. 3. COPD. 4. Possible hypovolemia. 5. Hypokalemia which is a known chronic problem.  Patient does not take her    potassium as has been recommended in the past. 6. Chronic pain of uncertain detail, given methadone in the past and also giving  conflicting histories this evening. 7. History of SBE 1995. 8. Miscarriage two months ago. 9. Status post bilateral nephrectomy.  PLAN: 1. Admit to telemetry unit. 2. Blood cultures and empiric antibiotics. 3. Narcan as needed for ______. 4. We will need to try to get more information about this patients pain history. 5. Send peritoneal fluid and resume CAPD. 6. Follow up blood sugars. DD:  05/02/99 TD:  05/03/99 Job: [id number]

## 2010-06-20 NOTE — Discharge Summary (Signed)
[place]. [facility]  Patient:    Tina Watkins, Tina Watkins                    MRN: [id number] Adm. Date:  [id number] Disc. Date: [id number] Attending:  Nolon Bussing Dictator:   Myriam Jacobson, P.A.C. CC:         Caren Griffins B. Lorrene Reid, M.D.             Orson Eva, M.D.             Coralie Keens, M.D.             Parke Poisson. Electa Sniff, M.D.             Hervey Ard, M.D.                           Discharge Summary  DISCHARGE DIAGNOSES:  1. Acinetobacter calcoaceticus peritonitis.  2. End-stage renal disease.     a. Hypokalemia.     b. Hypophosphatemia.  3. Hypotension.  4. Probable mild adrenal insufficiency.  5. Anemia.  6. Mild peripheral neuropathy.  7. Malnutrition.  8. Tobacco abuse.  9. History of medical noncompliance. 10. History of right eye iritis with blindness.  CONSULTATIONS:  1. Coralie Keens, M.D.  2. Dr. Reginal Lutes.  3. Parke Poisson. Electa Sniff, M.D.  PROCEDURES:  1. Hemodialysis.  2. Transfusion 2 units of packed red cells, May 30, 1999 and 2 units of     packed red cells, Jun 09, 1999.  3. PD removal, Jun 03, 1999, Dr. Ninfa Linden.  4. Central venogram with temporary dialysis catheter placement, Jun 06, 1999,     Dr. Evangeline Dakin.  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  Patient subsequently     had a right common femoral vein temporary dialysis catheter placed by     Dr. ______.  5. Placement of a right common femoral vein Perma-Cath by Dr. Deitra Mayo, Jun 10, 1999.  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 She had recently been discharged from [facility] on May 06, 1999 after admission for what was thought to have been opioid withdrawal.   Patient had run out of her methadone.  Patient now presents with a two-day history of abdominal pain and subjective fever and chills.  She had nausea and vomiting the morning of admission and she suspects that she has peritonitis. She has no reportive draining around her catheter.  Her fluid has been yellow and cloudy. Patient was evaluated in the emergency room by the resident, Dr. Reva Bores and Dr. Lorrene Reid.  Plans were made for the patients admission to began antibiotic treatment and continue with CAPD.  Upon initial presentation, she had 2630 cells in her PD fluid.  Other admission labs showed a white count of 7.8, hemoglobin 8.7, hematocrit 24.2, platelets 366,000, 74% neutrophils, 22% lymphocytes, 2% monocytes, sodium 131, potassium 3.2, chloride 94, CO2 26, glucose 100, BUN 41, creatinine 10.7, albumin 1.5, AST 21, ALT 18, alkaline phosphatase 78, total bilirubin 7.6.  HOSPITAL COURSE: #1 - ACINETOBACTER CALCOACETICUS PERITONITIS:  Blood cultures x 2 were done as was fluid cultures.  She was initially started on vancomycin  4 tablets and gentamicin was added.  It took several days for the patients organism to grow.  On day #2, patients white count is increased to 3,800. White count had dropped to 3,700 and greater than 20% bands were noted.  She was continued on the same antibiotic regime.  White count decreased to 460 by the 30th, was back to 3,800 on the 1st.  Dr. Ninfa Linden provided a surgical consult and plans were made for PD removal.  PD catheter was removed on Jun 03, 1999 and patient continued to improve.  Gentamicin and Tressie Ellis were discontinued on Jun 05, 1999 and she was started on oral Cipro.  Vancomycin was discontinued as well. Patient continued on Cipro the remainder of her hospitalization.  All blood cultures were negative.  Blood cultures  were done on May 30, 1999 admission and she had repeat blood cultures on Jun 04, 1999 for fever, all were negative.  She did  however have increase in white count to 11,300 on Jun 04, 1999 which went as high as 16,500 on Jun 05, 1999, but was down to 11,100 on Jun 11, 1999. Patient was discharged home to finish her course of Cipro.  #2 - END-STAGE RENAL DISEASE:  Patient is noted to have problems with hypokalemia and hypophosphatemia during her hospitalization.  She was repleted orally on both accounts and also initially had potassium chloride added to her PD fluid bags before she was changed to hemodialysis.  Patients prior access history was not known in full, therefore it was not known that she had a stent in her superior vena cava until she had a central venogram which was done as a result of inability to place IJ or subclavian catheters due to occlusions. Patient subsequently had a temporary catheter placed in her femoral vein which was transitioned to a Perm-Cath.  She was discharged home with the Perm-Cath with the hope that she would get a femoral graft for the long-term, however the patient still would like to return to peritoneal dialysis, however with her poor medical adherence, this would be a decision that needs to be discussed with the patient.  Calcium level on discharge is within normal limits at 3.7 and phosphorus was within normal limits as well at 3.3.  #3 - HYPOTENSION:  Patient was severely hypotensive during most of her hospitalization, and for the most part was asymptomatic.  It was thought that this possibly was due to adrenal insufficiency.  Corticotropin stimulation test was done and initial results shows a mild decrease, Dr. Electa Sniff was consulted.  Pertinent labs were not back at the time of discharge, and pending those results, she be started on prednisone.  Subsequent labs received after the patient was discharged showed low unstimulated AM cortisol levels 2.7 range with normals being 6.2 to 22, stimulated levels being 11 and 17.8.  A TSH level was checked and was found to be within  normal limits.  Dialysis  Center was to follow-up on the results of those tests.  #4 - ANEMIA:  As previously mentioned, patient received a total of 4 units of packed red cells during her hospitalization.  Stool hemoccults were negative. Patient had high ferritin levels and low iron saturation levels. Her C-reactive protein was elevated at 11.6, therefore she was started on a course of NSAID.  Hemoglobin at the time of discharge was 9.2 and this will be followed up in the outpatient center where she will receive both InFeD and Epogen.  #5 - PERIPHERAL NEUROPATHY:  Patient  apparently had an outpatient appointment scheduled with neurology to discuss this problem.  She had previously been on nortriptyline and was put on amitriptyline, and the hospitalization, she was quite upset about this, therefore she was changed back to nortriptyline.  She was evaluated by Dr. Rosiland Oz during her hospitalization and felt that her peripheral neuropathy was not severe and agreed with continuing her on nortriptyline.  #6 - MALNUTRITION:  Patient had very poor intake during her hospitalization. She was started on Megace 400 mg b.i.d.  Albumin level was for the most part, less than 1.5 during her hospitalization and this was largely felt due to her previously being on peritoneal dialysis, which also argues for the reason why she needs to continue on hemodialysis in the future because it is expected that her albumin will increase.  #7 - TOBACCO ABUSE:  Despite frequent warnings by nurses, patient was consistently smoking in her hospital room during her hospitalization.  Patient has ongoing problems with following appropriate procedures during hospitalization and following dietary prescriptions, such as having to be NPO for surgery, she would knowingly drink during this time even though it was against medical recommendation.  #8 - MULTIPLE SOCIAL ISSUES:  Patient had ongoing issues regarding where she was  going to live after discharge, and she worked with the Education officer, museum regarding this.  Patient was quite well aware of how the system worked and is quite resourceful on her own.  In the end, the plans were for her to return to live with her boyfriend at this time.  DISPOSITION: Patient was discharged home on Jun 11, 1999 in improved condition.  She will dialyze Monday, Wednesday, and Friday at the [facility].  DISCHARGE MEDICATIONS: 1. Nephro-Vite 1 tablet per day. 2. Nortriptyline 10 mg at bedtime. 3. Cipro 500 mg 1 tablet x 8 more days. 4. Megace 400 mg b.i.d. 5. Pred Forte ophthalmic solution 1% one drop q.i.d. right eye. 6. Cyclogyl ophthalmic solution 1% one drop right eye every hour. 7. Protonix 40 mg per day.  DISCHARGE DIET: Patient instructed 80 gm protein, 2 g sodium, 2 g potassium diet, limited fruits to 5 cups per day.  Keep dressing access instruction: Keep dressing on leg dry.  SPECIAL INSTRUCTIONS: Dialysis nurse will remove abdominal staples on Jun 13, 1999.  Dialysis orders:  Epogen 10,000 units.  No Calcijex, estimated dry weight to be called to the kidney center.  InFeD 100 mg IV x 6 more doses, then 100 mg per week. DD:  08/09/99 TD:  08/09/99 Job: [id number]

## 2010-06-20 NOTE — Discharge Summary (Signed)
   NAME:  Tina Watkins, Tina Watkins                       ACCOUNT NO.:  [id number]   MEDICAL RECORD NO.:  [id number]                   PATIENT TYPE:  INP   LOCATION:  6731                                 FACILITY:  [facility]   PHYSICIAN:  Nonah Mattes, P.A.                DATE OF BIRTH:  1965-12-15   DATE OF ADMISSION:  08/21/2002  DATE OF DISCHARGE:  08/25/2002                                 DISCHARGE SUMMARY   ADMISSION DIAGNOSES:  1. Acute appendicitis.  2. End-stage renal disease, on chronic hemodialysis.  3. Secondary hyperparathyroidism.  4. Anemia of chronic disease.   DISCHARGE DIAGNOSES:  1. Status post  appendectomy secondary to acute  appendicitis.  2. Anemia of chronic disease.  3. End-stage renal disease with  a new  lower dry weight.  4. Secondary hyperparathyroidism.  5. 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 She presents to [facility] with a 12  hour history of nausea and vomiting and abdominal pain, loose stools,  headache and diaphoretic episodes just prior to vomiting. Her last menstrual  cycle started  4 days ago and it has been uneventful without heavy bleeding,  clots or pain. A CT scan was consistent with acute appendicitis.   HOSPITAL COURSE:  The patient was admitted and made n.p.o. She was placed on  empiric antibiotics using IV Zosyn and underwent urgent appendectomy  secondary to acute appendicitis. Postoperatively she recovered without  complications. Her diet was slowly advanced. She was maintained on the Zosyn  for several days postoperatively.  She was placed on full solid foods the day before discharge and tolerated  them well. She was discharged in stable  condition.   FOLLOW UP:  She was to follow up with Dr. Dalbert Batman.                                                Nonah Mattes, P.A.    RRK/MEDQ  D:  10/08/2002  T:  10/08/2002  Job:  [id number]   cc:   Edsel Petrin. Dalbert Batman, M.D.  [id number], [place] [address]  Fax: [place]

## 2010-06-20 NOTE — H&P (Signed)
[place]. [facility]  Patient:    Tina Watkins, Tina Watkins                    MRN: [id number] Adm. Date:  [id number] Attending:  Lance Sell Dictator:   Oda Kilts CC:         [place] Nurse                         History and Physical  CHIEF COMPLAINT: Abdominal pain with subjective fevers x 2 days, nausea and vomiting x 1.  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  She was discharged from [facility] on May 06, 1999 after an admission for what was thought to be opioid withdrawal (the patient reported she ran out of her methadone approximately a month ago). She now presents with a two day history of abdominal pain and subjective fevers and chills.  She had nausea and vomiting x 1 this a.m.  It is suspected she has peritonitis.  She reports no drainage about her catheter site but does report that her fluid has been yellow and cloudy.  PAST MEDICAL HISTORY:  1. 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  2. Status post cadaveric renal transplant x 2 at [facility], the first one from [place] to 1992, and her second in 1999.  They     failed secondary to primary nonfunction.  She has been on PD with five     exchanges per day 2-1/2 liters per exchange using 1-1/2% bags.  3. Chronic pain.  4. Chronic hypotension, with systolic blood pressures running between 123456     systolic.  5. History of iritis, blind in right eye and followed by Dr. Deloria Lair.  6. Status post bilateral nephrectomy.  7. Tobacco abuse since age 47.  58. Admitted for hypoglycemia, nausea and vomiting likely secondary to     opioid  withdrawal from May 02, 1999 through May 05, 1999.  9. 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 10. Miscarriage approximately three months ago. 11. Anemia secondary to #6. 12. History of mini strokes greater than one year prior to admission.  ALLERGIES: No known drug allergies.  MEDICATIONS:  1. K-Dur 20 mEq p.o. b.i.d.  2. Epogen 20,000 units subQ each Monday, Wednesday, and Friday.  3. Renagel 800 mg p.o. t.i.d. q.a.c.  4. Elavil 10 mg p.o. q.h.s.  5. Tylenol p.r.n.  6. Nephro-Vite 1 p.o. q.d.  7. Iron sulfate 325 mg p.o. q.d.  8. Benadryl 25 mg p.o. q.6h p.r.n.  SOCIAL HISTORY: She is single and lives with a female friend.  She has no children.  She has smoked heavily since the age of 45 and smokes approximately one pack per three days.  No alcohol.  No drugs, but used marijuana in high school.  She has previously been on methadone, presumably for chronic pain.  FAMILY HISTORY: Her mother is living at age 46.  Her father is living and is age 53.  Neither have medical problems per her report.  She has a healthy brother who is also living.  REVIEW OF SYSTEMS:  Positive for abdominal pain, nausea and vomiting x 1, subjective fevers, and slight headache.  Negative for diarrhea, constipation, chest pain, shortness of breath, melena, or hematochezia.  PHYSICAL EXAMINATION:  VITAL SIGNS:  Temperature 97.3 degrees, blood pressure 41/60, pulse 131, respiratory rate 18.  GENERAL: She is a thin African-American female in obvious discomfort, writhing in pain.  SKIN: Warm and dry.  Catheter site shows no erythema or drainage.  NECK: Supple, with no JVD or meningismus.  HEENT: Head normocephalic, atraumatic.  Oropharynx shows no erythema or exudate.  Nares patent bilaterally.  LUNGS: Clear to auscultation bilaterally.  HEART: Regular rate and rhythm with normal S1 and S2.  There is a 3/6 systolic ejection murmur at the left left sternal border.  ABDOMEN: Severely tender  abdomen diffusely.  There is no rebound or guarding. There are positive bowel sounds.  EXTREMITIES: No edema, no joint effusion.  NEUROLOGIC: Alert and oriented x 3, otherwise grossly intact.  LABORATORY DATA: WBC 7.8, hemoglobin 8.7, platelets 356,000.  Sodium 131, potassium 3.2, chloride 94, CO2 26, BUN 41, creatinine 10.7, glucose 100. Calcium 9.1, SGOT 21, alkaline phosphatase 78, SGPT 18, total bilirubin 0.6. PD fluid shows 2630 cells, 93% neutrophils, 5% lymphocytes, 2% monocytes/ macrophages; straw and cloudy in color.  Gram stain shows moderate wbcs, predominantly neutrophils, but on organisms.  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  She also has chronic hypotension with systolic blood pressures 123456 mmHg at baseline.  She now presents with severe abdominal pain, nausea and vomiting, and chills, with 2630 cells in her peritoneal dialysis fluid.  This is all consistent with peritonitis.  The peritoneal dialysis fluid Gram stain is negative for any organism.  Peritoneal dialysis fluid cultures and blood cultures are pending.  We will go ahead and begin IV antibiotics and perform continuous ambulatory peritoneal dialysis to try to flush out the bacteria.  Additionally the patient presents with profound hypotension, worse than her baseline.  We will transfuse her with one unit of packed red blood cells for a hemoglobin of 8.7 and hopefully this will also help her blood pressure.  PLAN:  1. Peritonitis - We will begin with vancomycin 1.5 g IV x 1 and check a     vancomycin level in approximately two days.  We will also start Fortaz     1 g IV q.24h.  We will use morphine p.r.n. for pain as well as Toradol     p.r.n., with Tylenol for fever.  We will follow up on blood cultures     as well as her PD fluid cultures.  2.  Hypotension - We will repeat 250 cc fluid bolus and watch her carefully      on telemetry.  Should she continue to show profound hypotension we may     consider transferring her to the medical intensive care unit.  We will     also transfuse her with one unit of packed red blood cells for a     hemoglobin of 8.7.  3. Hypokalemia - We will supplement the patients hypokalemia with oral     K-Dur as well as provide her with 4 mEq per liter of KCl through her     PD fluid.  DD:  05/30/99 TD:  05/31/99 Job: [id number]

## 2010-06-20 NOTE — Op Note (Signed)
[place]. [facility]  Patient:    Tina Watkins, Tina Watkins                    MRN: [id number] Proc. Date: 06/03/99 Adm. Date:  [id number] Attending:  Lance Sell                           Operative Report  PREOPERATIVE DIAGNOSIS:  Infected peritoneal dialysis catheter.  POSTOPERATIVE DIAGNOSIS:  Infected peritoneal dialysis catheter.  PROCEDURE:  Removal of infected peritoneal dialysis catheter.  SURGEON:  Douglas A. Ninfa Linden, M.D.  ANESTHESIA:  General endotracheal anesthesia.  ESTIMATED BLOOD LOSS:  Minimal.  PROCEDURE IN DETAIL:  The patient was brought to the operating room, identified as [name].  She was placed supine on the operating table and general anesthesia was induced.  Her abdomen was then prepped and draped in usual sterile fashion.  Using a #15 blade, a small transverse incision was made in the patients left side overlying her previous catheter incision.  The incision was carried down through the subcutaneous layer to the palpable catheter.  The catheter was then freed of surrounding scar tissue and attachments.  Then, the catheter was then followed down to its exit site from the peritoneal cavity.  The cuff was identified and again, the surrounding tissue was dissected free from it.  Once the catheter was completely free, it was removed from the abdominal cavity and sent to pathology for identification, as well as, cultures.  The fascial defect was then closed with a running #1 Vicryl suture.  The wound was then irrigated and the skin was closed with skin staples.  The patient tolerated the procedure well.  All sponge, needle and instrument counts were correct at the end of the procedure. The patient was then extubated in the operating room and taken in stable condition to the recovery room. DD:  06/03/99 TD:  06/05/99 Job: [id number]

## 2010-06-20 NOTE — H&P (Signed)
[place]. [facility]  Patient:    Tina Watkins, Tina Watkins Visit Number: [id number] MRN: [id number]          Service Type: MED Location: [phone] Attending Physician:  Sherril Croon Dictated by:   Sherril Croon, M.D. 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 She is admitted for anticoagulation. However, a history of dysfunctional uterine bleeding does exist.  PAST MEDICAL HISTORY:  1. ESRD, secondary to post infectious glomerulonephritis at 46 years of age.  2. Status post transplant and transplant nephrectomy, 1988-1992, and 1999.  3. History of peritoneal dialysis and failure.  4. History of tobacco abuse.  5. Anemia.  6. Secondary hyperparathyroidism.  7. History of glaucoma, right eye, with blindness.  FAMILY HISTORY: Mother, father, brother all alive and well.  SOCIAL HISTORY: She is single and lives alone.  Has no children.  Has a history of miscarriage.  Nonsmoker.  REVIEW OF SYSTEMS: Negative and as otherwise stated.  She is blind in the right eye.  She had a negative stress test in March 2002.  She is a smoker. She denies any abdominal pain, nausea, vomiting, change in bowel habits, blood noted in the stools.  She complains of dysfunctional uterine bleeding and of a history of clots in the upper extremities, both the right and left central systems and central system.  PHYSICAL EXAMINATION:  GENERAL: Very pleasant lady.   Blind, alert, well-kempt.  VITAL SIGNS: Blood pressure 115/65, pulse 85, respirations 20, temperature 97.1 degrees.  HEENT: Normocephalic, atraumatic.  Pupils, blind right eye, left eye normal. ENT, normal external appearance.  Oropharynx clear.  NECK: Supple.  No thyromegaly, no adenopathy.  CARDIOVASCULAR: Heart regular rate and rhythm.  Faint systolic murmur, left sternal edge, did not radiate.  CHEST: Respirations clear to auscultation bilaterally.  No wheezes, no rales.  ABDOMEN: Soft, nondistended, nontender.  Bowel sounds present.  No bruits.  EXTREMITIES: No cyanosis, no clubbing.  No peripheral edema.  NEUROLOGIC: Cranial nerves 2-12 intact.  She had some weakness and pain located in the right arm; however, mobility was intact.  Extremities reveal tendonitis in the left wrist.  She had a right femoral cast in place.  LABORATORY DATA: Laboratories pending.  ASSESSMENT/PLAN: End-stage renal disease with clot in inferior vena cava.  1. Hemodialysis today.  Check i-STAT.  2. Bones.  Secondary to hyperparathyroidism.  Place on binders, Renagel,     Tums, and Calcijex 1.5 mcg.  3. Anemia.  Continue InFeD, Epogen.  May need hysterectomy if dysfunctional     uterine bleeding becomes an issue.  However, I would recommend     anticoagulation lifelong in view of the history of clots.  4. Access.  Right groin Ash catheter.  5. Volume.  Claims to be 5 kg up in weight.  Will aim for 5 kg off.  6. History of tendonitis or carpal tunnel syndrome in left  arm.  Analgesics.     Also orthopedic consultation.Dictated by:   Sherril Croon, M.D.  Attending Physician:  Sherril Croon DD:  11/04/00 TD:  11/05/00 Job: [id number]

## 2010-06-20 NOTE — Discharge Summary (Signed)
NAME:  TANNER, ROTHMAN                       ACCOUNT NO.:  [id number]   MEDICAL RECORD NO.:  [id number]                   PATIENT TYPE:  INP   LOCATION:  [id number]                                 FACILITY:  [facility]   PHYSICIAN:  Windy Kalata, M.D.          DATE OF BIRTH:  [birth year]   DATE OF ADMISSION:  08/21/2002  DATE OF DISCHARGE:  08/25/2002                                 DISCHARGE SUMMARY   ADMISSION DIAGNOSES:  1. Acute appendicitis.  2. End-stage renal disease on chronic hemodialysis.  3. Secondary hyperparathyroidism.  4. Anemia of chronic disease.   DISCHARGE DIAGNOSES:  1. Status post appendectomy for acute appendicitis.  2. End-stage renal disease on chronic hemodialysis.  3. Secondary hyperparathyroidism.  4. Anemia of chronic disease.  5. 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   BRIEF HISTORY:  A 45 year old black female with end-stage renal disease  secondary to chronic glomerulonephritis since 1988 on chronic hemodialysis  on Tuesday, Thursday, and Saturday at the [facility].  Presents to the emergency room with a 12-hour history of nausea, vomiting,  right lower quadrant abdominal pain, loose stools, and headache.  CT scan in  the ER shows findings consistent with acute appendicitis and the patient is  admitted for appendectomy.   LABORATORY DATA:  On admission, white count 14,900, hemoglobin 13.6,  platelets 136,000.  Sodium 135, potassium 5.9, chloride 99, CO2 24, BUN 41,  creatinine 12.3, glucose 110, calcium 9.4.  Lipase 24, amylase 134.  LFTs  within normal limits.   HOSPITAL COURSE:  The patient was admitted, made NPO, given Zosyn IV, and  taken to surgery for urgent appendectomy for acute appendicitis.  She  tolerated the procedure well.  Postoperatively she recovered well without  complications.  Her diet was slowly advanced.  Her antibiotics were stopped  on the day prior  to discharge.  Her wound was healing nicely.  She was  discharged to follow up with Dr. Dalbert Batman one week later.   CT scan of the abdomen had an incidental finding of a thrombus in the IVC  around the catheter tip of the left femoral [employer] dialysis catheter.  The  catheter's function was tenuous at times allowing blood flows of 400 and  other times as low as 300.  We will continue to monitor this as an  outpatient and intervene should catheter function decrease.   DISCHARGE MEDICATIONS:  1. Nephro-Vite vitamin one daily.  2. Renagel 800 mg three with meals.  3. Nortriptyline 10 mg at bedtime.  4. Folic acid 1 mg daily.  5. Colace 100 mg b.i.d.  6. Pepcid 40 mg daily.  7. EPO 20,000 units each dialysis (increased).  8. Calcitriol 1.5 mcg IV each dialysis.   New dry weight 54.5 kg.      Nonah Mattes, P.A.  Windy Kalata, M.D.    RRK/MEDQ  D:  10/08/2002  T:  10/08/2002  Job:  [id number]   cc:   Edsel Petrin. Dalbert Batman, M.D.  [id number] [address], [place] [address]  Fax: [place]

## 2010-07-04 ENCOUNTER — Encounter (HOSPITAL_COMMUNITY): Payer: Medicaid Other | Attending: Nephrology

## 2010-07-04 ENCOUNTER — Other Ambulatory Visit: Payer: Self-pay | Admitting: Nephrology

## 2010-07-04 DIAGNOSIS — D638 Anemia in other chronic diseases classified elsewhere: Secondary | ICD-10-CM | POA: Insufficient documentation

## 2010-07-04 DIAGNOSIS — N183 Chronic kidney disease, stage 3 unspecified: Secondary | ICD-10-CM | POA: Insufficient documentation

## 2010-07-04 LAB — IRON AND TIBC
Iron: 88 ug/dL (ref 42–135)
Saturation Ratios: 39 % (ref 20–55)
TIBC: 227 ug/dL — ABNORMAL LOW (ref 250–470)
UIBC: 139 ug/dL

## 2010-07-05 LAB — FERRITIN: Ferritin: 1475 ng/mL — ABNORMAL HIGH (ref 10–291)

## 2010-07-07 LAB — POCT HEMOGLOBIN-HEMACUE: Hemoglobin: 11.2 g/dL — ABNORMAL LOW (ref 12.0–15.0)

## 2010-07-23 IMAGING — CR DG CHEST 2V
2 series · 2 of 2 positions shown · non-contrast
Comparison: 07/13/2005

CLINICAL DATA: Cough, malaise.

CHEST - 2 VIEW

[w chest pa *]
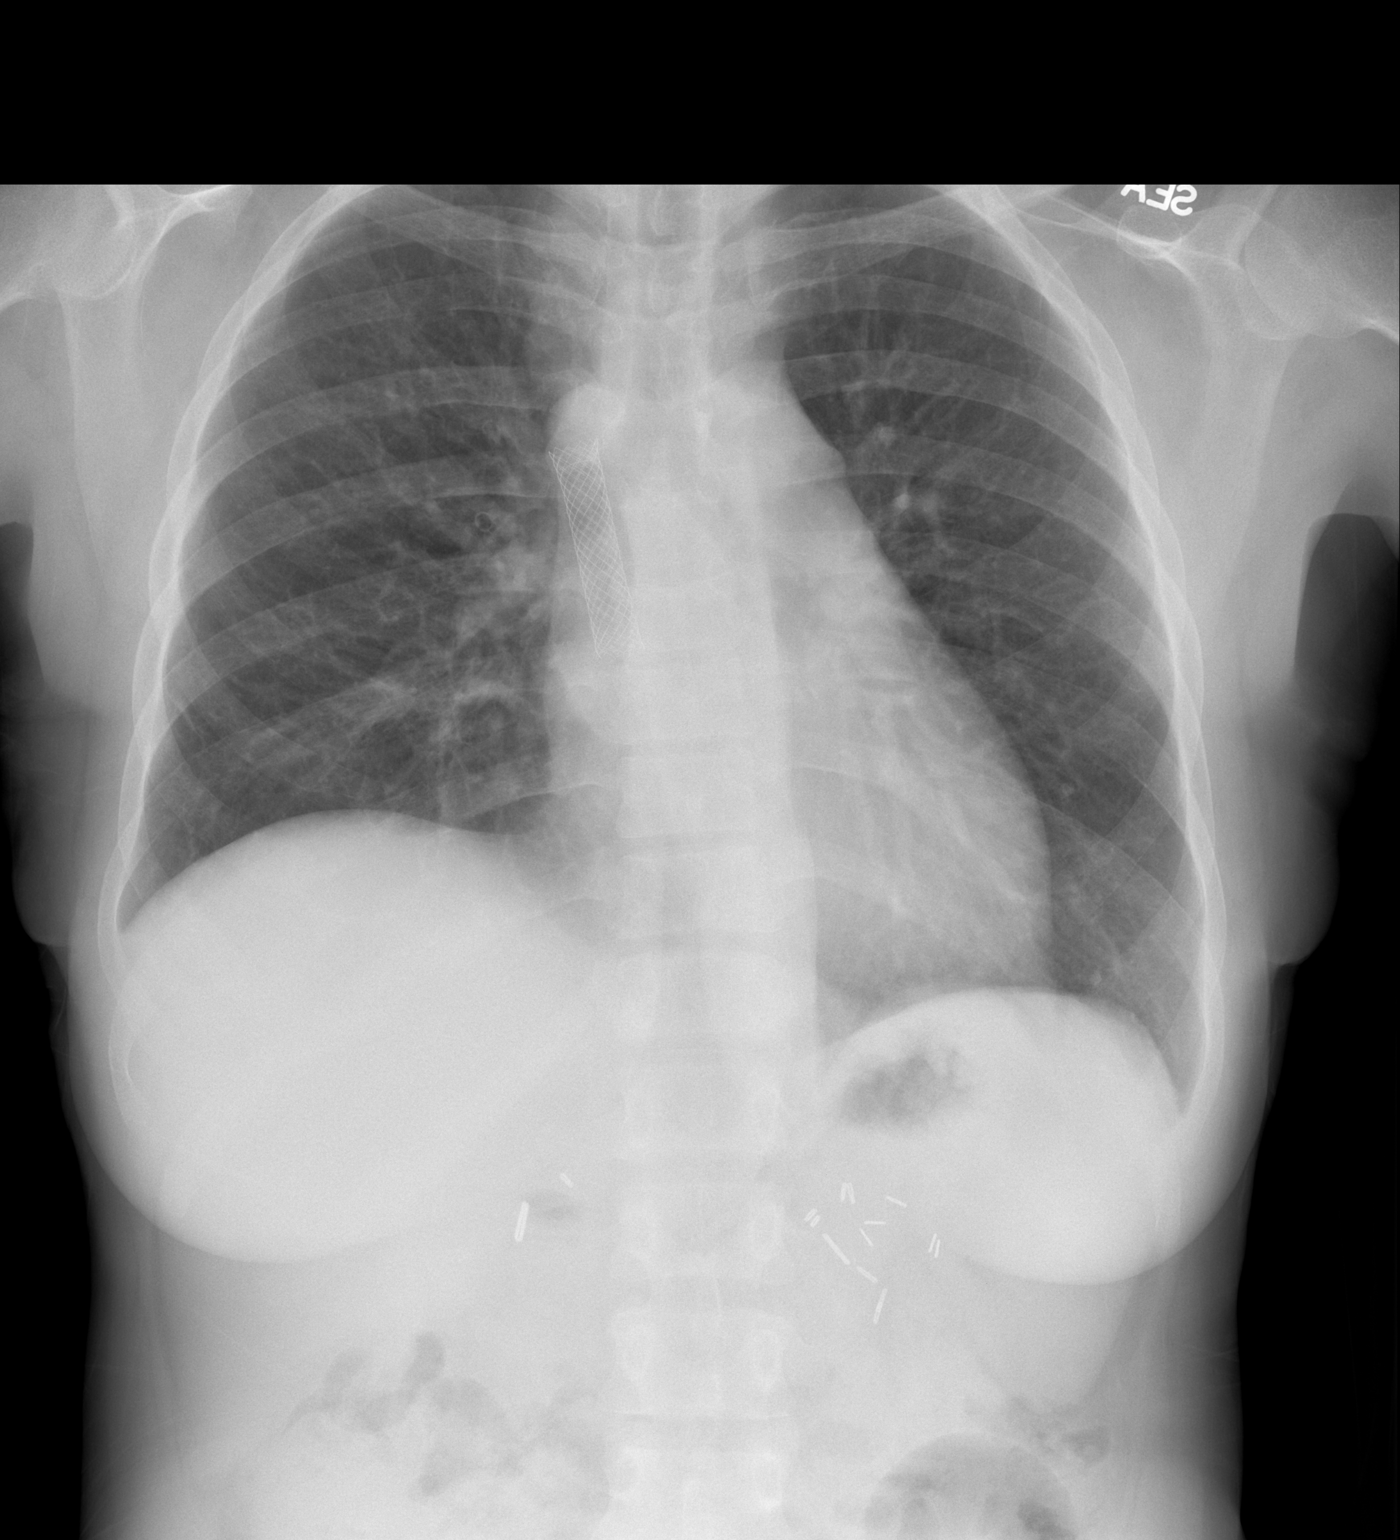

[w chest lat]
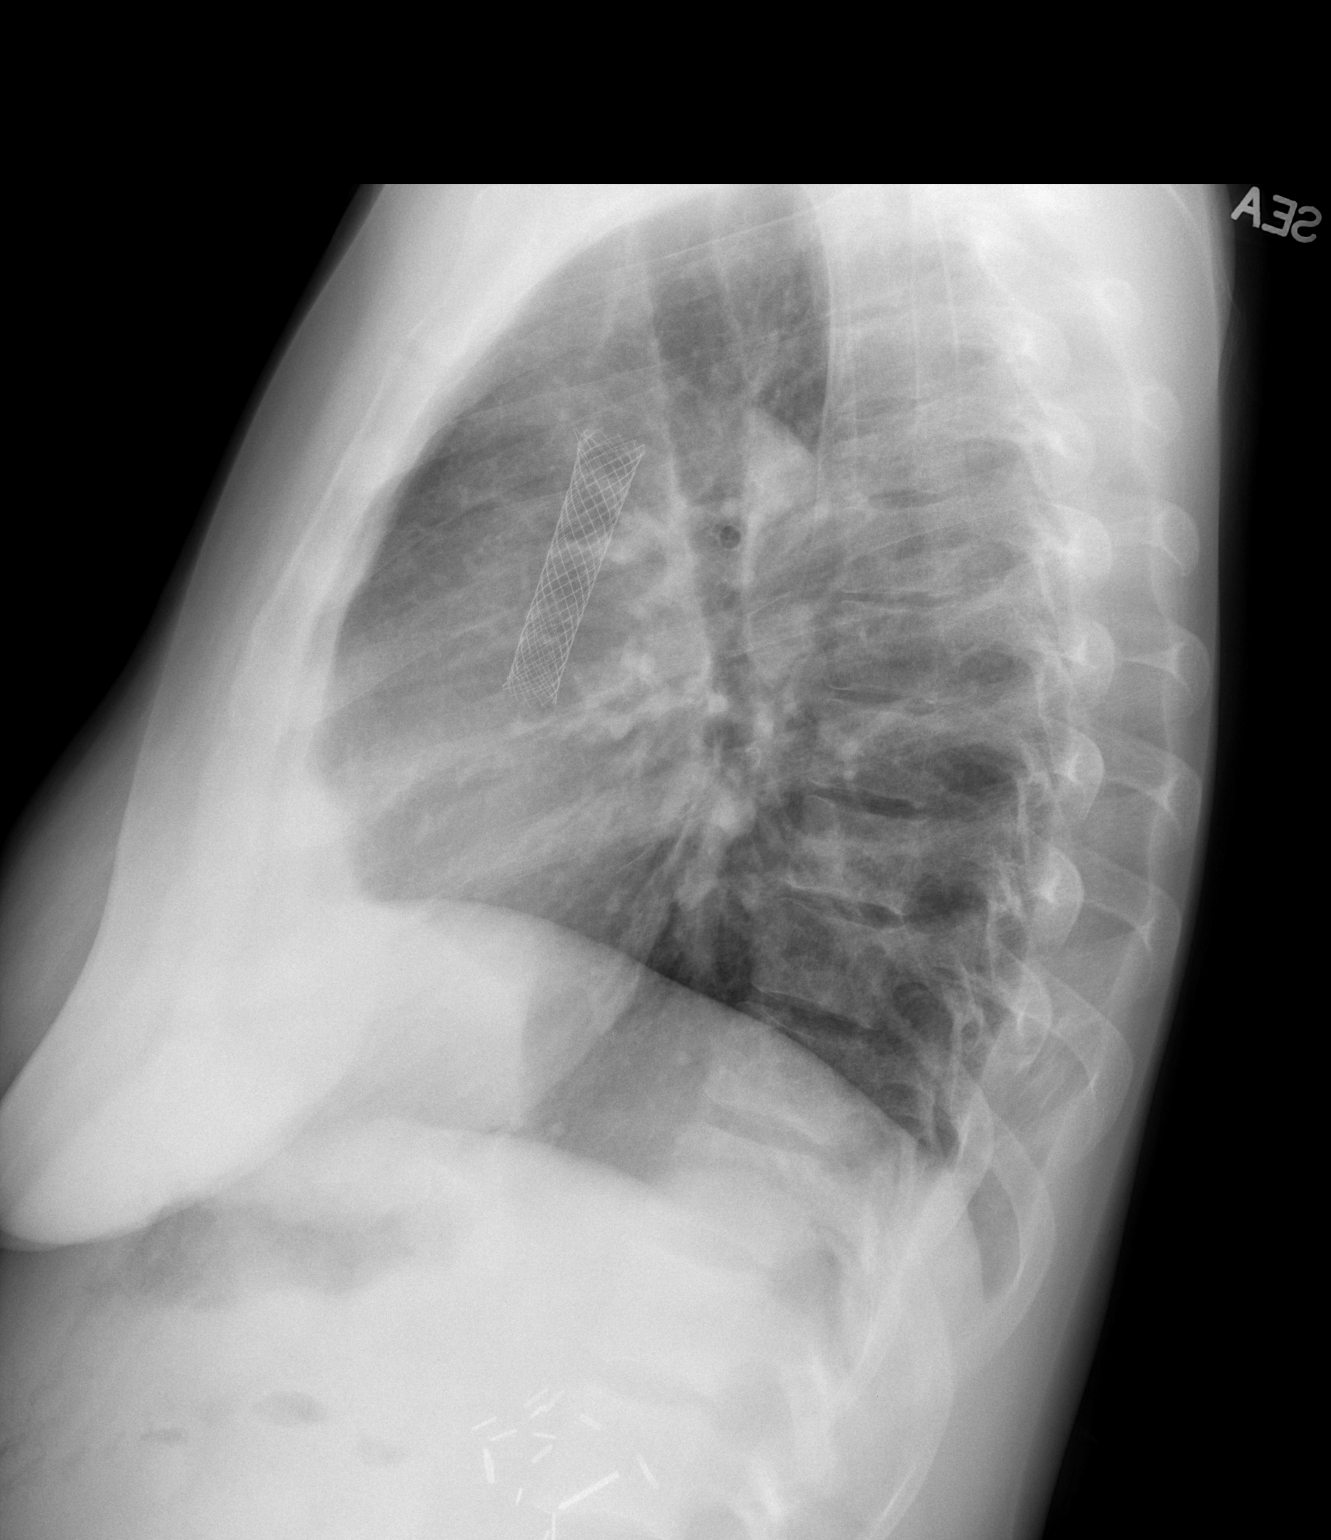

[2 of 2 positions shown; findings below may reference images not displayed]

FINDINGS: Heart is upper limits normal in size.  Lungs are clear.
No effusions.  No acute bony abnormality.  SVC stent again noted,
unchanged.
IMPRESSION: No acute findings.

## 2010-07-23 IMAGING — CR DG ELBOW 2V*R*
2 series · 2 of 2 positions shown · non-contrast
Comparison: None

CLINICAL DATA: Right elbow swelling, pain.

RIGHT ELBOW - 2 VIEW

[x elbow joint ap right]
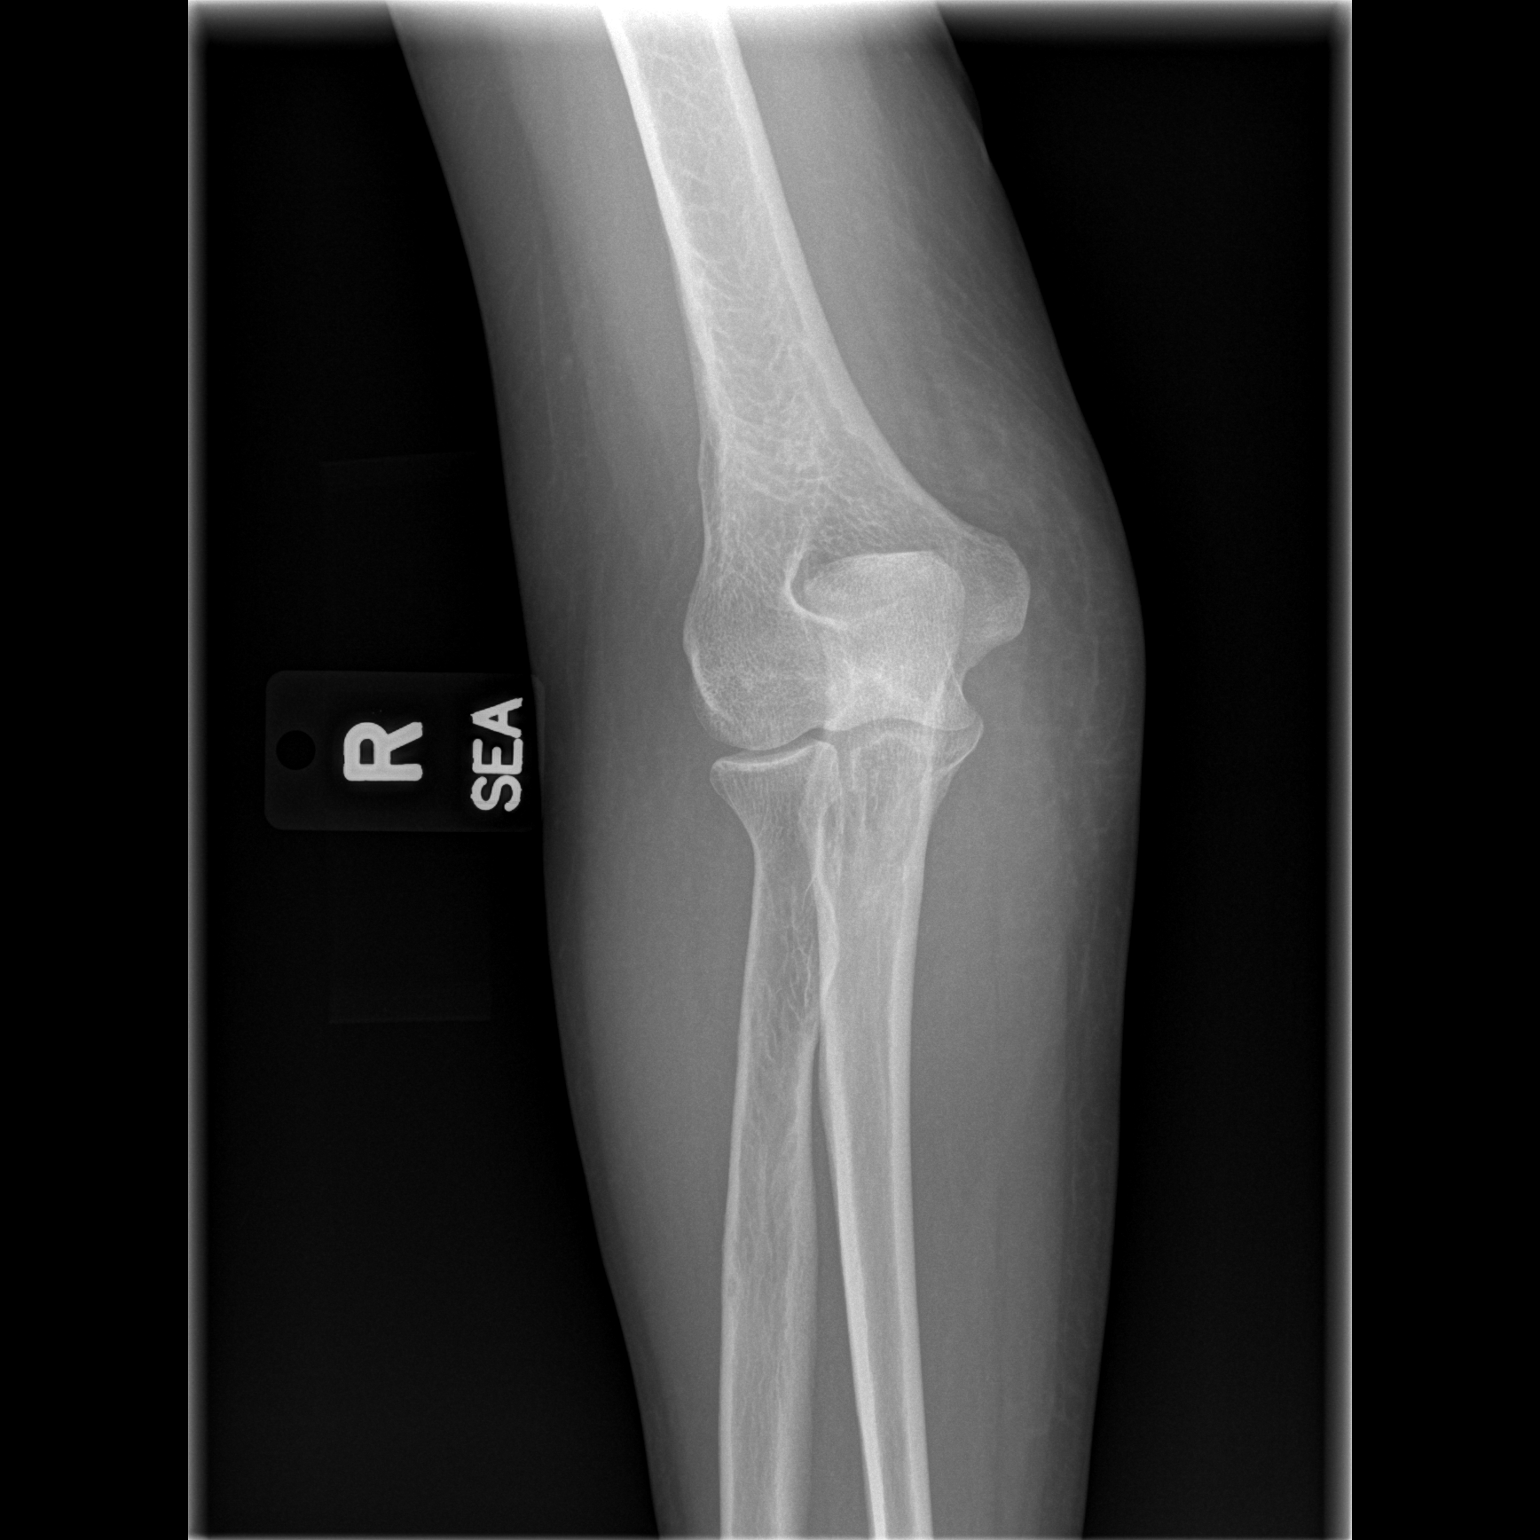

[x elbow joint lat right]
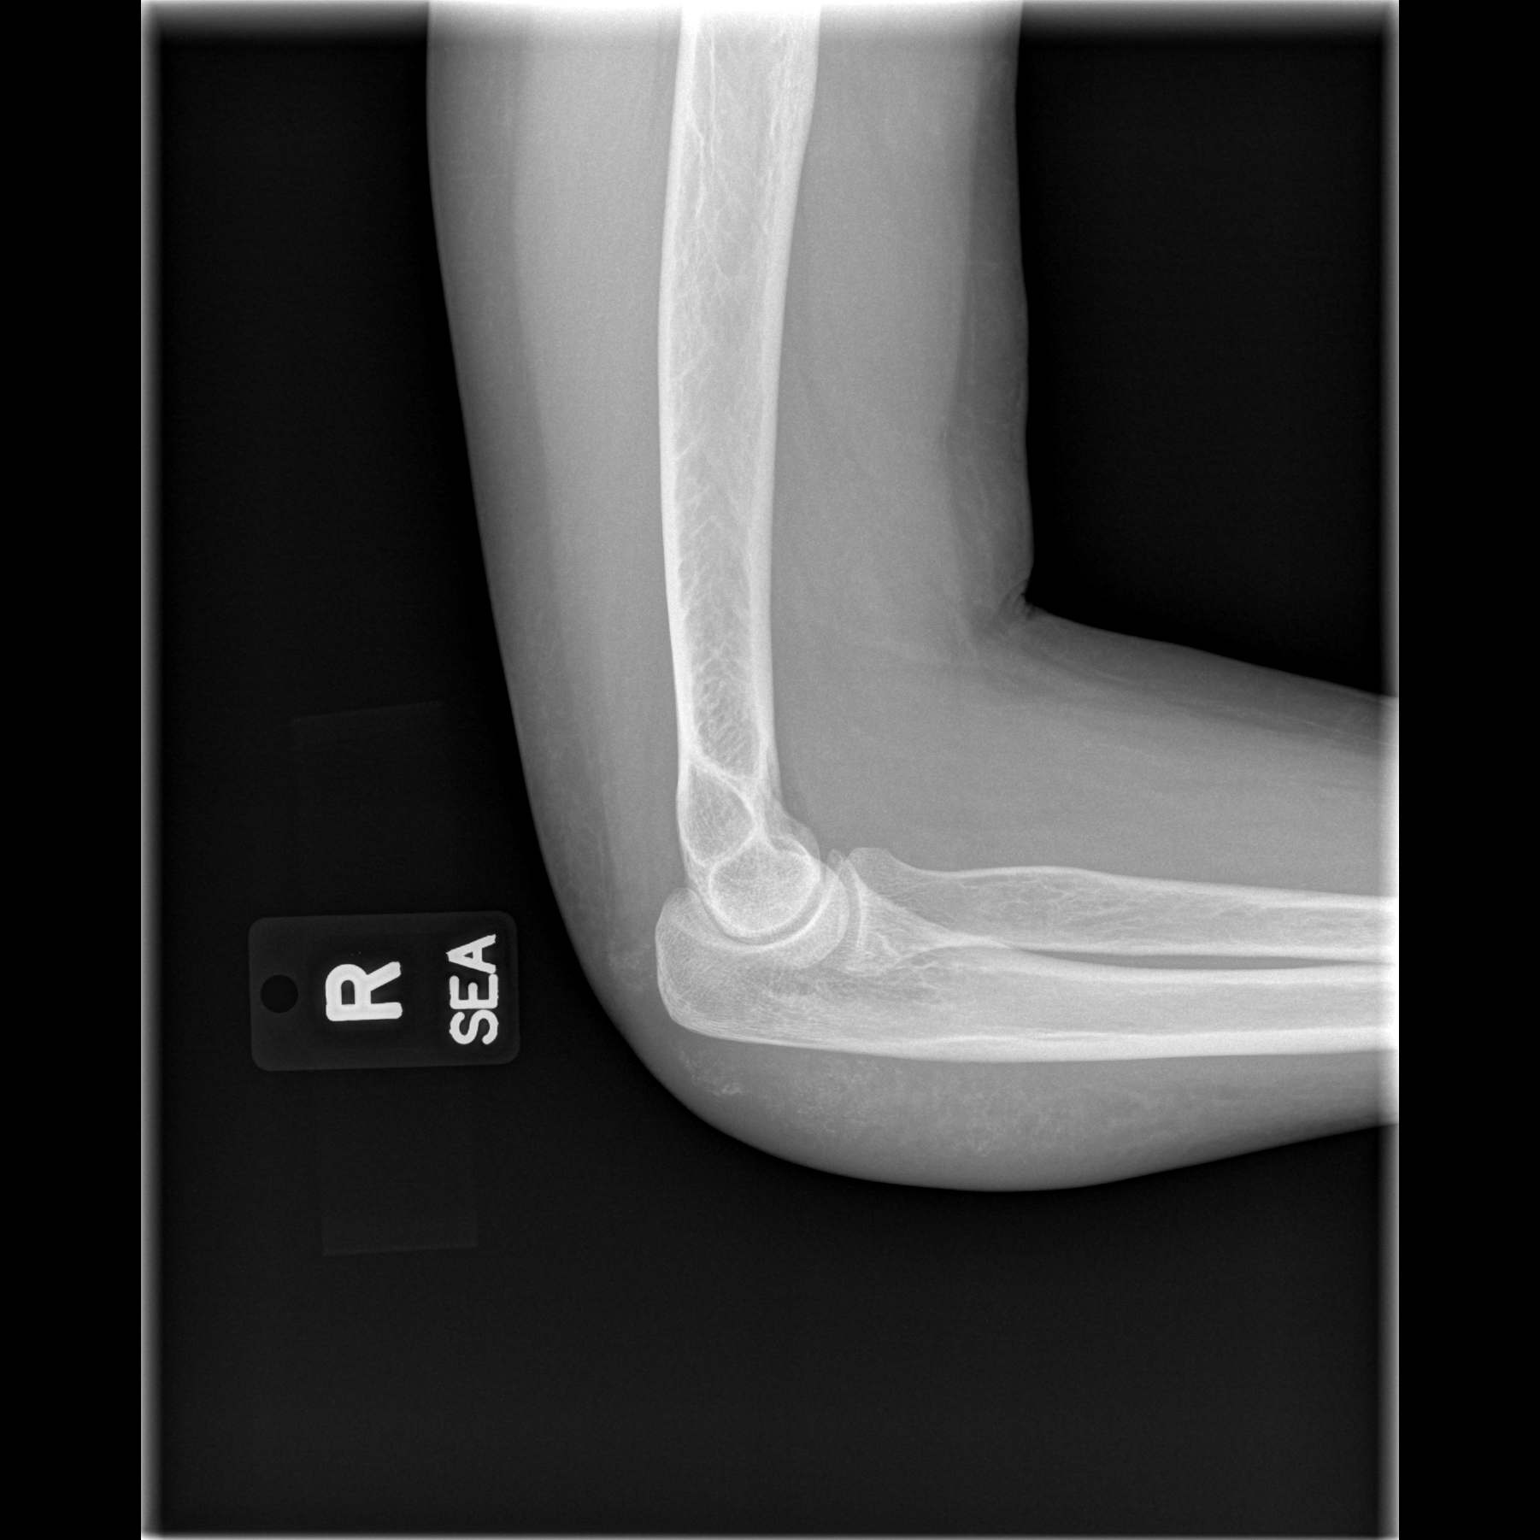

[2 of 2 positions shown; findings below may reference images not displayed]

FINDINGS: Soft tissue swelling noted posterior to the right elbow
and proximal forearm.  There appear be calcifications within this
area of soft tissue swelling of unknown etiology.  No acute bony
abnormality.  No fracture, subluxation, or dislocation.  No joint
effusion.
IMPRESSION: Posterior soft tissue swelling, with apparent internal
calcifications.

No acute bony abnormality.

## 2010-08-01 ENCOUNTER — Other Ambulatory Visit: Payer: Self-pay | Admitting: Nephrology

## 2010-08-01 ENCOUNTER — Encounter (HOSPITAL_COMMUNITY): Payer: Medicaid Other

## 2010-08-04 LAB — POCT HEMOGLOBIN-HEMACUE: Hemoglobin: 11.1 g/dL — ABNORMAL LOW (ref 12.0–15.0)

## 2010-09-08 ENCOUNTER — Encounter (HOSPITAL_COMMUNITY): Payer: Medicaid Other

## 2010-09-09 ENCOUNTER — Other Ambulatory Visit: Payer: Self-pay | Admitting: Nephrology

## 2010-09-09 ENCOUNTER — Encounter (HOSPITAL_COMMUNITY): Payer: Medicaid Other | Attending: Nephrology

## 2010-09-09 DIAGNOSIS — N183 Chronic kidney disease, stage 3 unspecified: Secondary | ICD-10-CM | POA: Insufficient documentation

## 2010-09-09 DIAGNOSIS — D638 Anemia in other chronic diseases classified elsewhere: Secondary | ICD-10-CM | POA: Insufficient documentation

## 2010-09-09 LAB — IRON AND TIBC
Iron: 98 ug/dL (ref 42–135)
Saturation Ratios: 44 % (ref 20–55)
TIBC: 223 ug/dL — ABNORMAL LOW (ref 250–470)
UIBC: 125 ug/dL

## 2010-09-09 LAB — POCT HEMOGLOBIN-HEMACUE: Hemoglobin: 9.2 g/dL — ABNORMAL LOW (ref 12.0–15.0)

## 2010-09-09 LAB — FERRITIN: Ferritin: 1079 ng/mL — ABNORMAL HIGH (ref 10–291)

## 2010-09-17 ENCOUNTER — Other Ambulatory Visit: Payer: Self-pay | Admitting: Nephrology

## 2010-09-17 DIAGNOSIS — Z1231 Encounter for screening mammogram for malignant neoplasm of breast: Secondary | ICD-10-CM

## 2010-09-23 ENCOUNTER — Encounter (HOSPITAL_COMMUNITY): Payer: Medicaid Other

## 2010-09-23 ENCOUNTER — Other Ambulatory Visit: Payer: Self-pay | Admitting: Nephrology

## 2010-09-23 LAB — POCT HEMOGLOBIN-HEMACUE: Hemoglobin: 10.3 g/dL — ABNORMAL LOW (ref 12.0–15.0)

## 2010-10-04 IMAGING — CR DG ABDOMEN ACUTE W/ 1V CHEST
3 series · 3 of 3 positions shown · non-contrast
Comparison: Chest x-ray 05/05/2008 and acute abdominal series
03/12/2008

CLINICAL DATA: Abdominal pain with nausea and vomiting.

ACUTE ABDOMEN SERIES (ABDOMEN 2 VIEW & CHEST 1 VIEW)

[w chest pa]
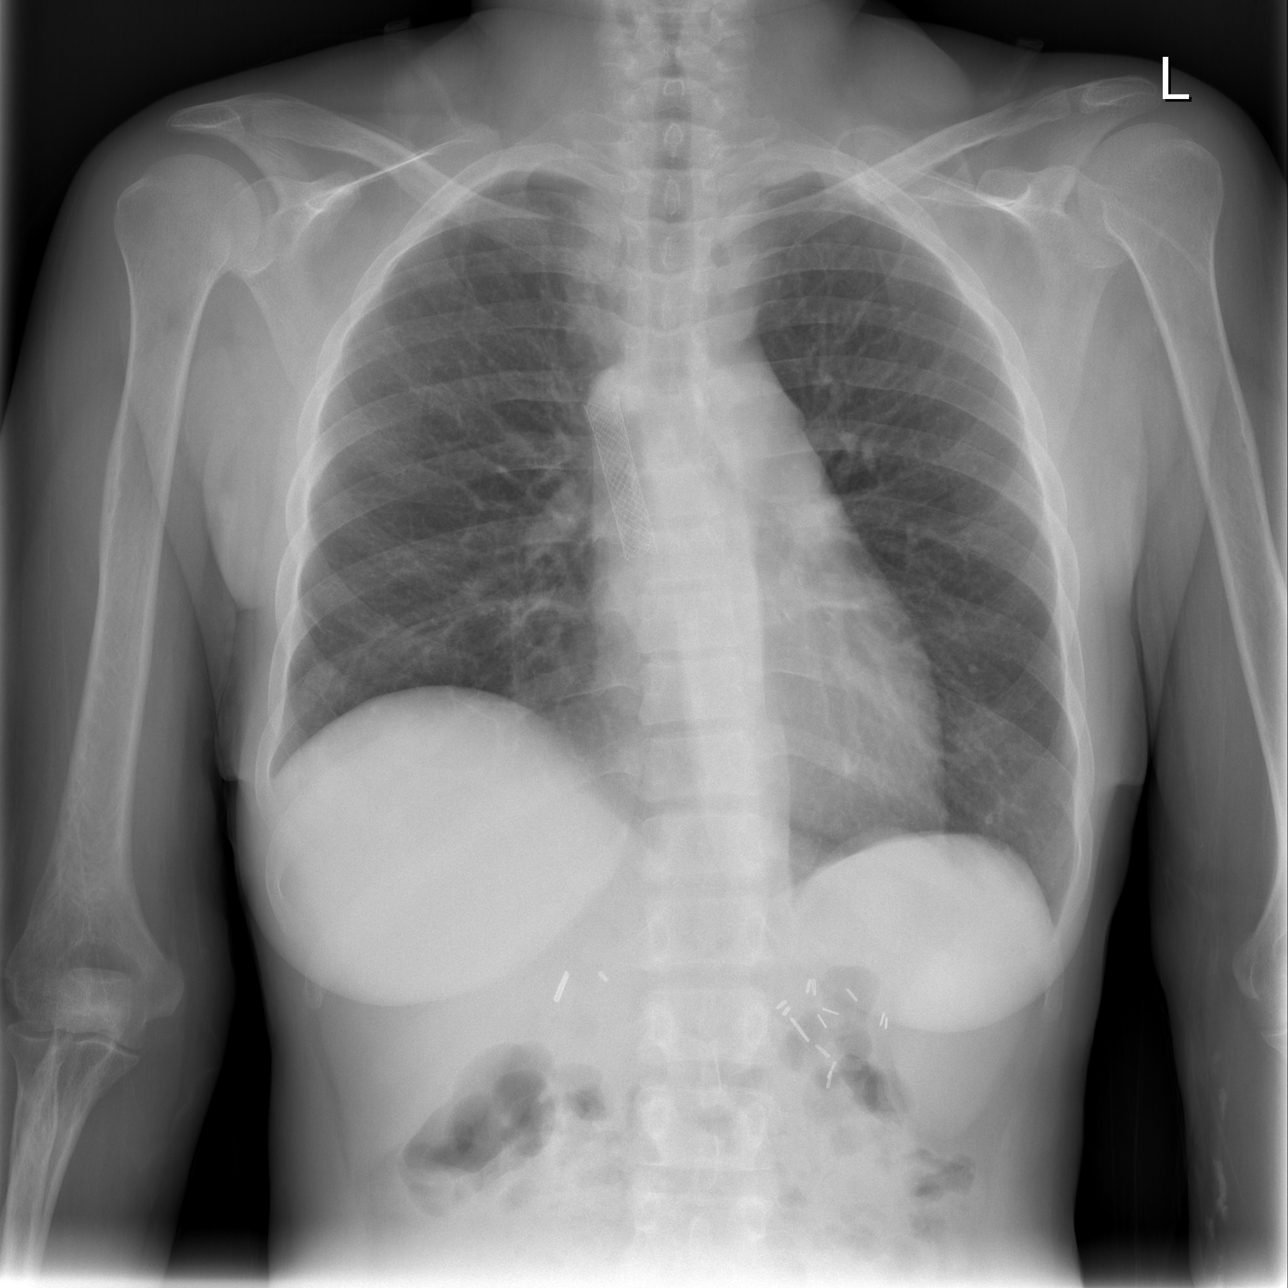

[w abdomen upright *]
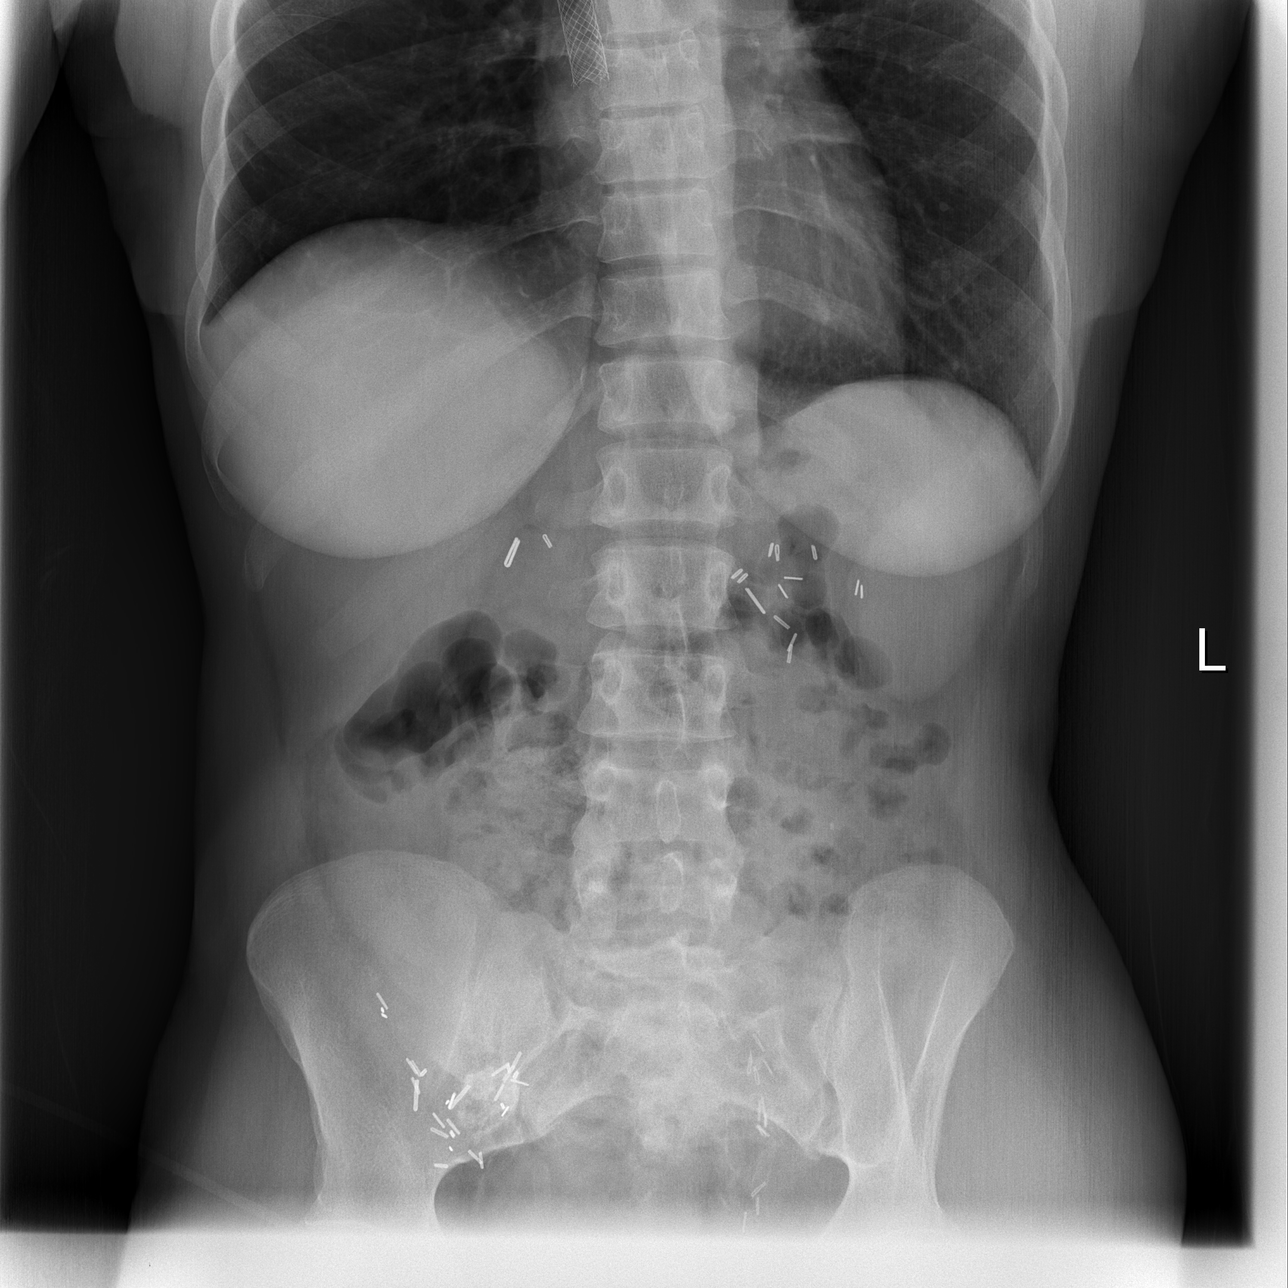

[t abdomen supine]
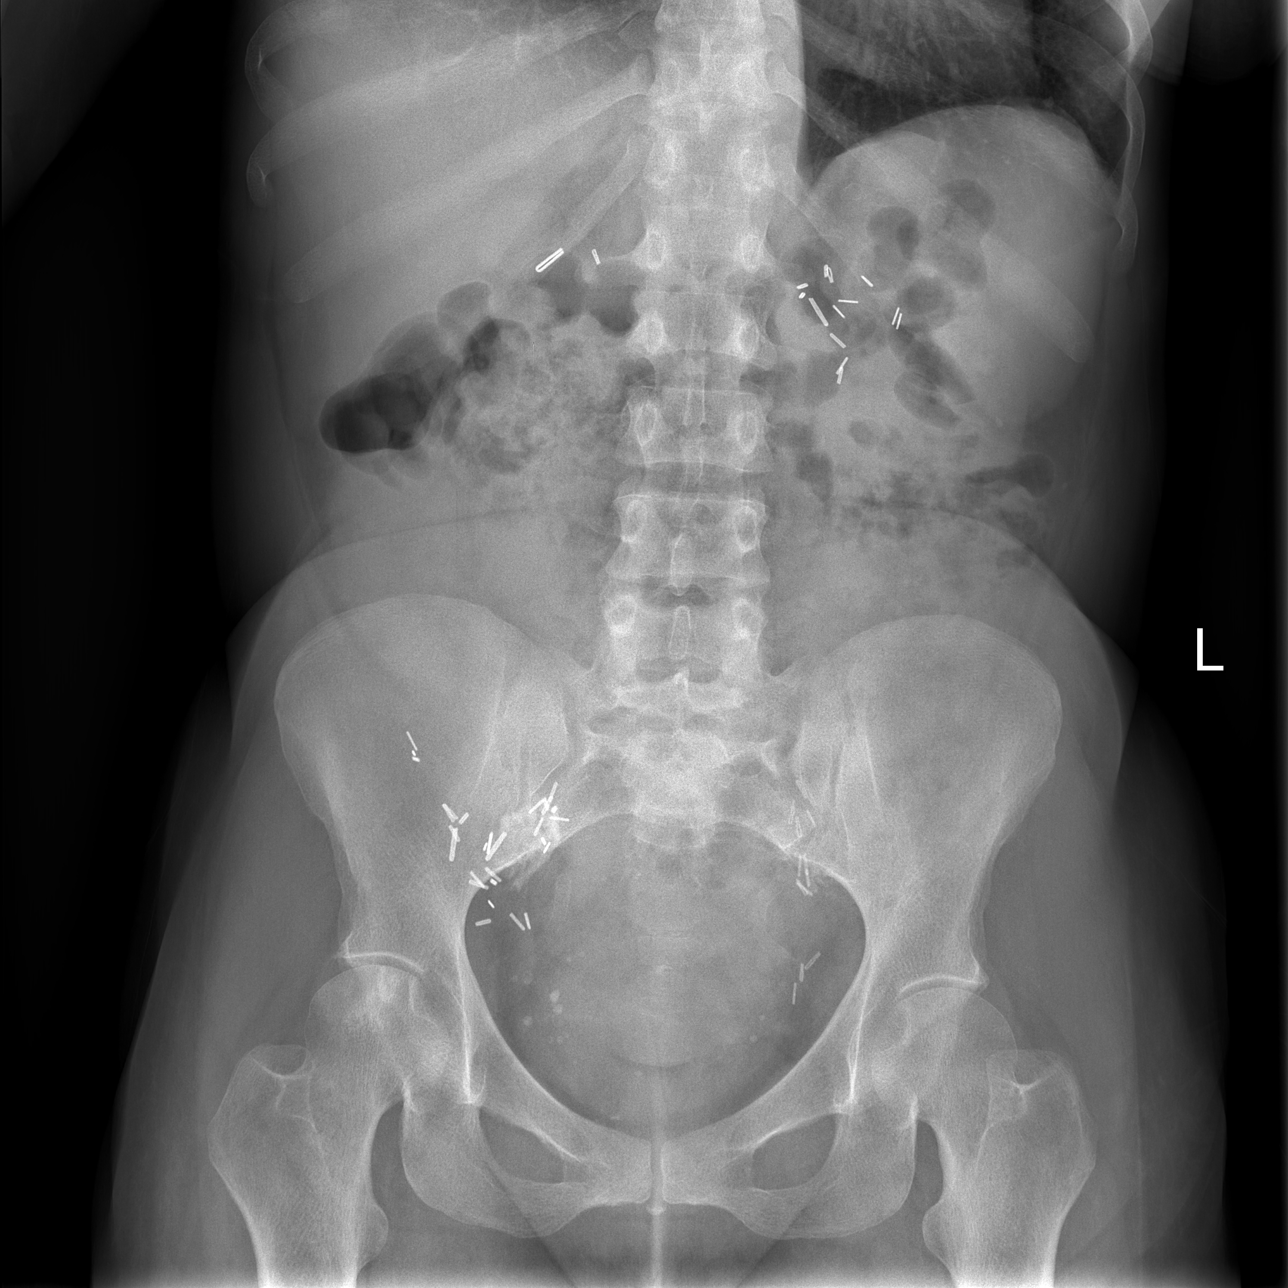

[3 of 3 positions shown; findings below may reference images not displayed]

FINDINGS: Frontal view of the chest shows midline trachea and
normal heart size.  A stent projects over the SVC.  Lungs are
clear.  Right hemidiaphragm is elevated, as before.

Two views of the abdomen show numerous surgical clips.  Bowel gas
pattern is nonobstructive.
IMPRESSION: Nonobstructive bowel gas pattern.

## 2010-10-10 ENCOUNTER — Other Ambulatory Visit: Payer: Self-pay | Admitting: Nephrology

## 2010-10-10 ENCOUNTER — Encounter (HOSPITAL_COMMUNITY): Payer: Medicaid Other | Attending: Nephrology

## 2010-10-10 DIAGNOSIS — D638 Anemia in other chronic diseases classified elsewhere: Secondary | ICD-10-CM | POA: Insufficient documentation

## 2010-10-10 DIAGNOSIS — N183 Chronic kidney disease, stage 3 unspecified: Secondary | ICD-10-CM | POA: Insufficient documentation

## 2010-10-10 LAB — IRON AND TIBC
Iron: 67 ug/dL (ref 42–135)
Saturation Ratios: 29 % (ref 20–55)
TIBC: 229 ug/dL — ABNORMAL LOW (ref 250–470)
UIBC: 162 ug/dL (ref 125–400)

## 2010-10-10 LAB — POCT HEMOGLOBIN-HEMACUE: Hemoglobin: 11.6 g/dL — ABNORMAL LOW (ref 12.0–15.0)

## 2010-10-11 LAB — FERRITIN: Ferritin: 1512 ng/mL — ABNORMAL HIGH (ref 10–291)

## 2010-10-20 ENCOUNTER — Ambulatory Visit: Payer: Medicaid Other

## 2010-10-20 ENCOUNTER — Ambulatory Visit
Admission: RE | Admit: 2010-10-20 | Discharge: 2010-10-20 | Disposition: A | Discharge status: Home / Self Care | Payer: Medicaid Other | Source: Ambulatory Visit | Attending: Nephrology | Admitting: Nephrology

## 2010-10-20 DIAGNOSIS — Z1231 Encounter for screening mammogram for malignant neoplasm of breast: Secondary | ICD-10-CM

## 2010-10-27 LAB — POCT HEMOGLOBIN-HEMACUE
Hemoglobin: 10.4 — ABNORMAL LOW
Hemoglobin: 8.1 — ABNORMAL LOW
Operator id: 274481
Operator id: 181601

## 2010-10-29 LAB — CBC
HCT: 34.9 — ABNORMAL LOW
Hemoglobin: 11.6 — ABNORMAL LOW
MCHC: 33.4
MCV: 77.5 — ABNORMAL LOW
Platelets: 209
RBC: 4.5
RDW: 18.1 — ABNORMAL HIGH
WBC: 7.3

## 2010-10-30 LAB — CBC
HCT: 35.1 — ABNORMAL LOW
Hemoglobin: 11.7 — ABNORMAL LOW
MCHC: 33.3
MCV: 75.3 — ABNORMAL LOW
Platelets: 207
RBC: 4.66
RDW: 16.9 — ABNORMAL HIGH
WBC: 7.6

## 2010-10-31 IMAGING — MG MM SCREEN MAMMOGRAM BILATERAL
4 series · 4 of 4 positions shown · non-contrast
Comparison: none

DG SCREEN MAMMOGRAM BILATERAL
Bilateral CC and MLO view(s) were taken.

DIGITAL SCREENING MAMMOGRAM WITH CAD:
There are scattered fibroglandular densities.  No masses or malignant type calcifications are 
identified.  Compared with prior studies.
Images were processed with CAD.

[R CC]
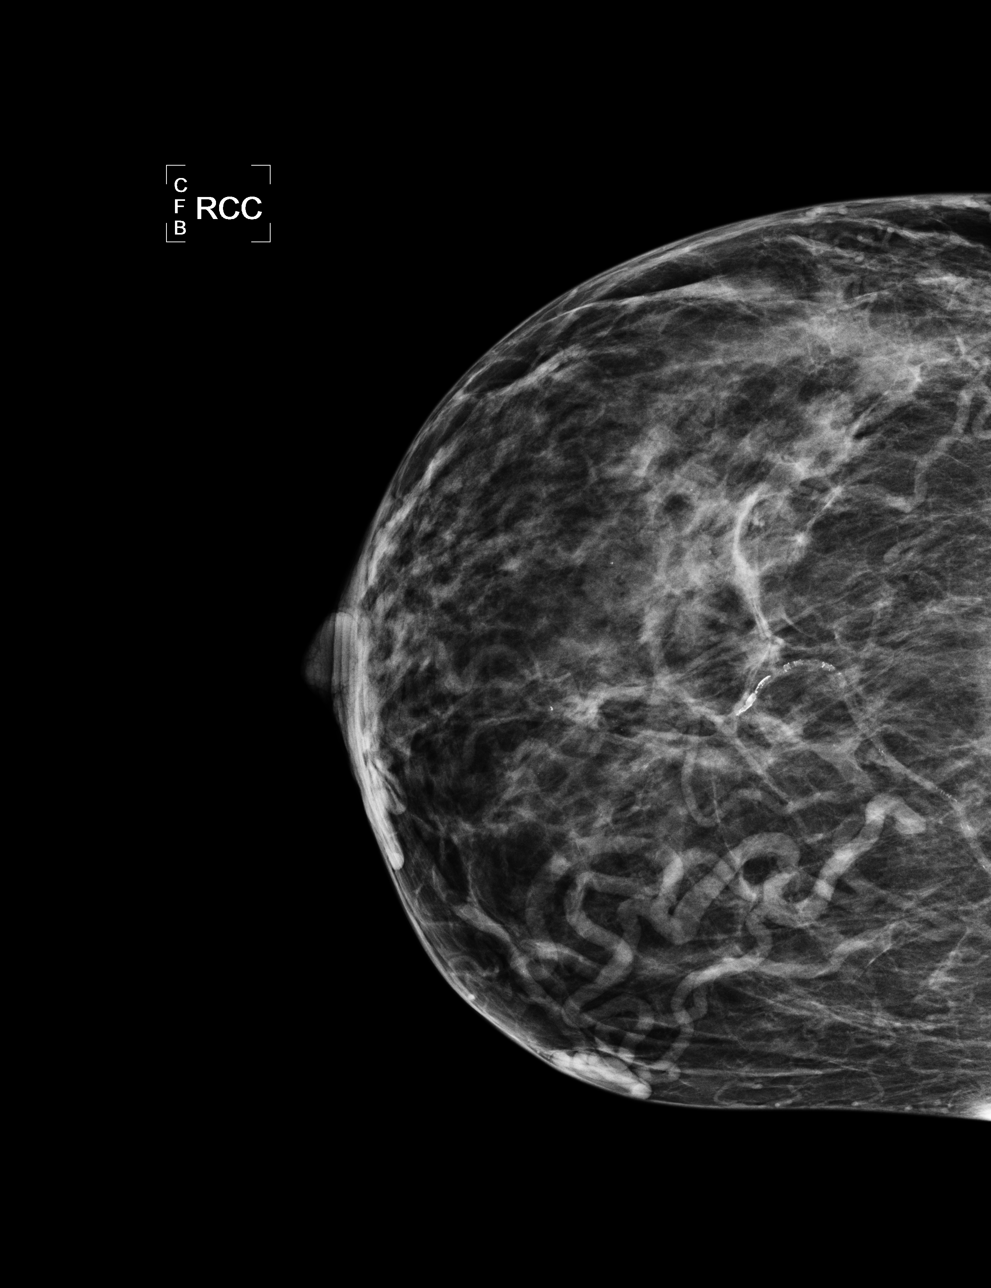

[L CC]
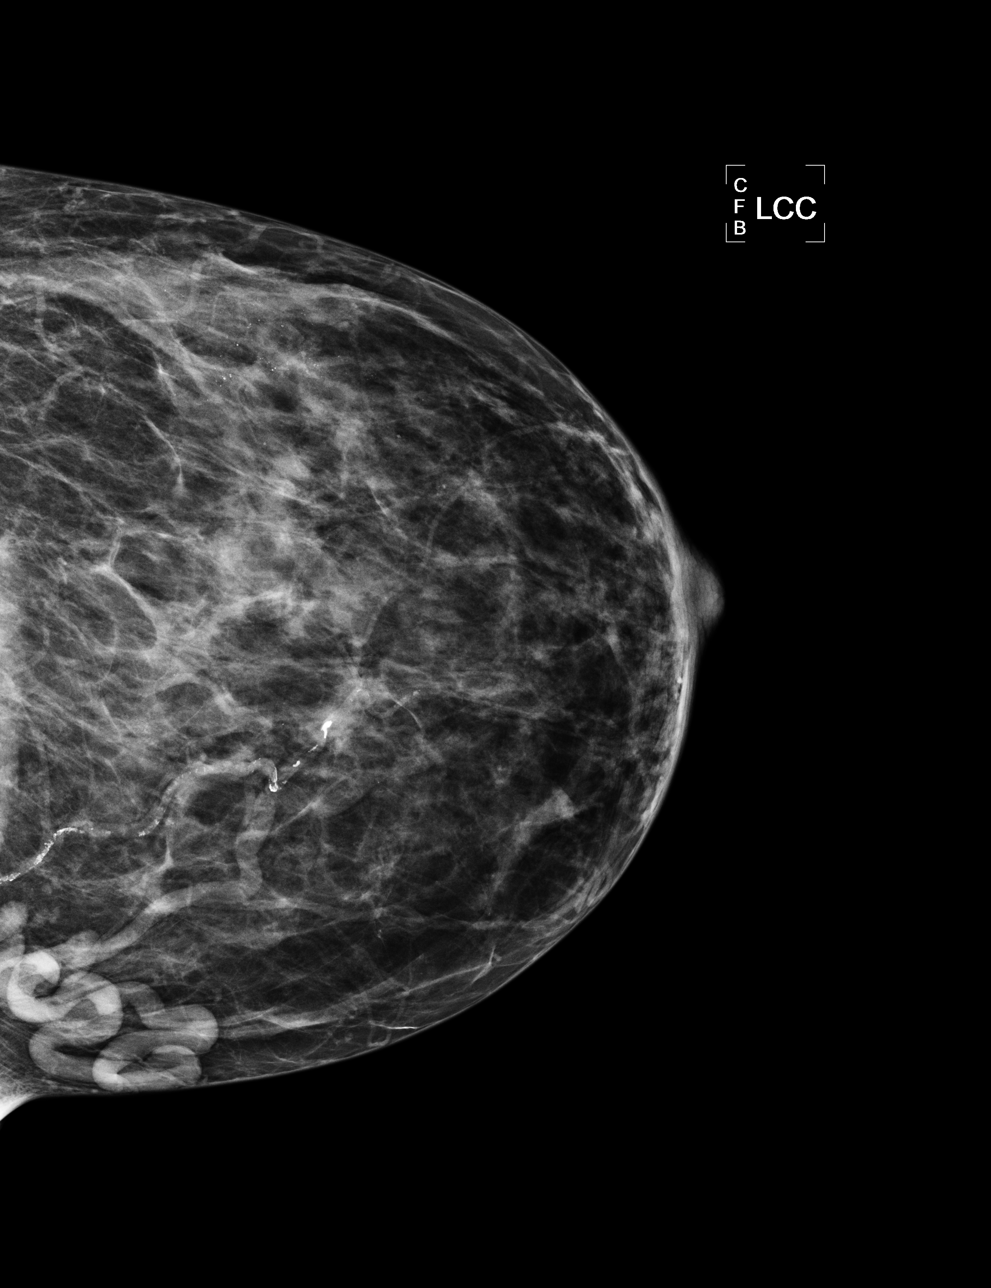

[L MLO]
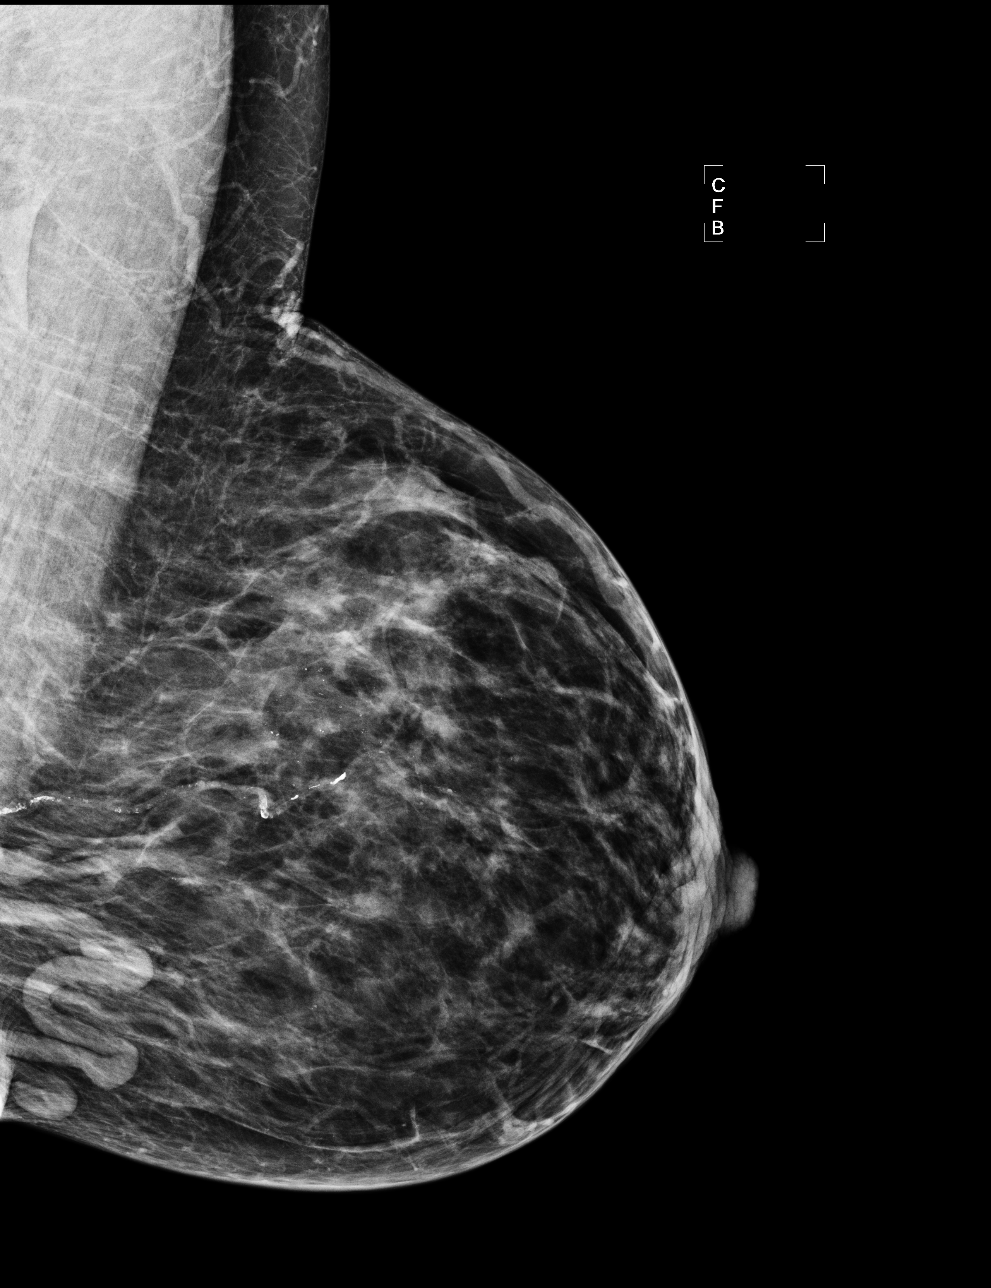

[R MLO]
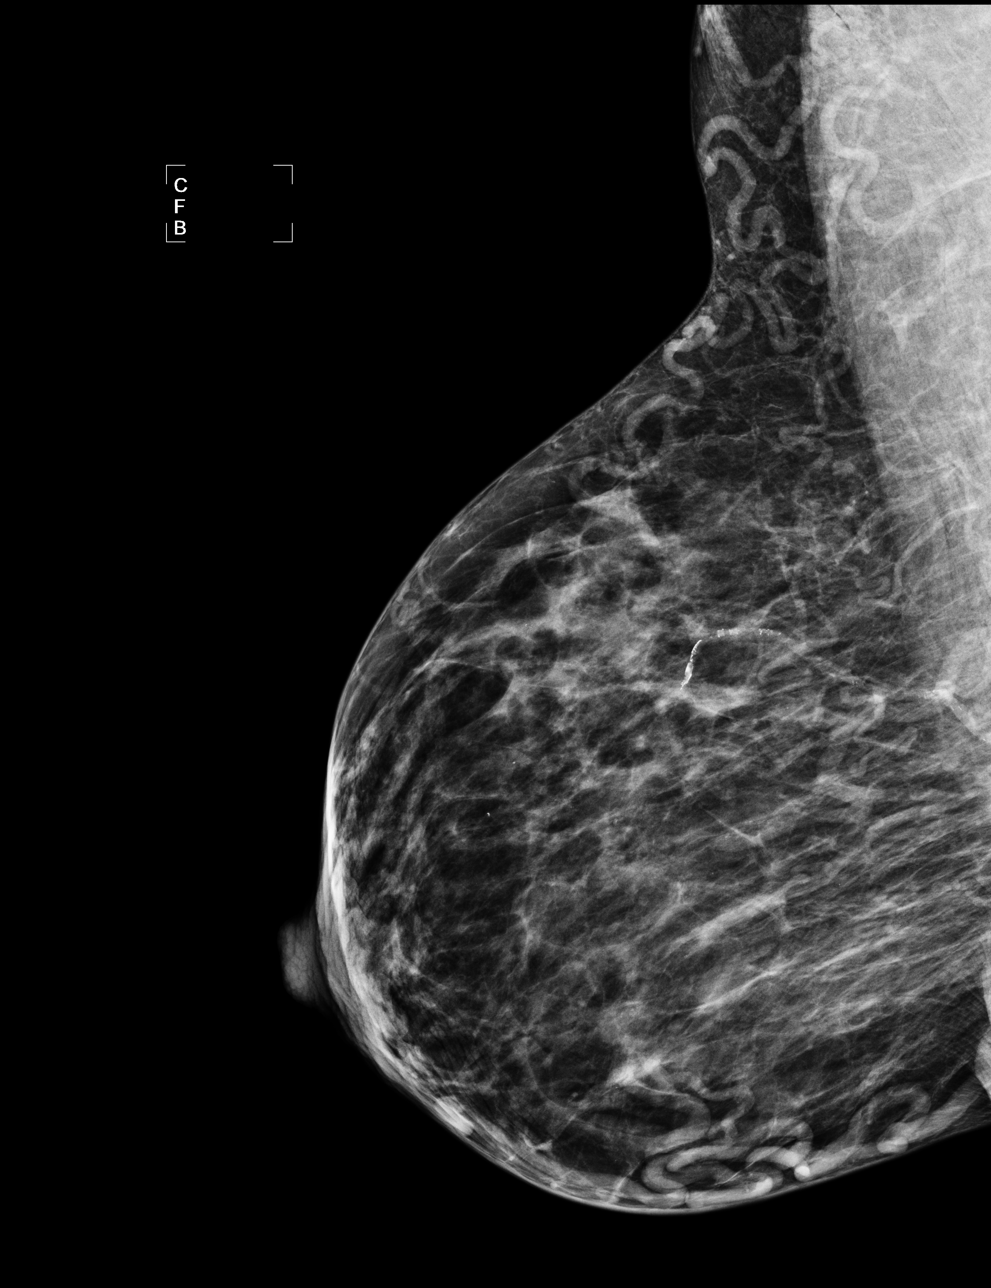

[4 of 4 positions shown; findings below may reference images not displayed]

IMPRESSION: No specific mammographic evidence of malignancy.  Next screening mammogram is recommended in one 
year.

A result letter of this screening mammogram will be mailed directly to the patient.

ASSESSMENT: Negative - BI-RADS 1

Screening mammogram in 1 year.
ANALYZED BY COMPUTER AIDED DETECTION. , THIS PROCEDURE WAS A DIGITAL MAMMOGRAM.

## 2010-11-03 LAB — POCT HEMOGLOBIN-HEMACUE: Hemoglobin: 10.4 g/dL — ABNORMAL LOW (ref 12.0–15.0)

## 2010-11-04 LAB — POCT HEMOGLOBIN-HEMACUE
Hemoglobin: 11.1 g/dL — ABNORMAL LOW (ref 12.0–15.0)
Hemoglobin: 10.7 g/dL — ABNORMAL LOW (ref 12.0–15.0)
Hemoglobin: 12.3 g/dL (ref 12.0–15.0)

## 2010-11-06 LAB — CBC
HCT: 32.1 % — ABNORMAL LOW (ref 36.0–46.0)
Hemoglobin: 10.7 g/dL — ABNORMAL LOW (ref 12.0–15.0)
MCHC: 33.3 g/dL (ref 30.0–36.0)
MCV: 79.6 fL (ref 78.0–100.0)
Platelets: 231 10*3/uL (ref 150–400)
RBC: 4.03 MIL/uL (ref 3.87–5.11)
RDW: 21.6 % — ABNORMAL HIGH (ref 11.5–15.5)
WBC: 9.2 10*3/uL (ref 4.0–10.5)

## 2010-11-06 LAB — POCT I-STAT, CHEM 8
BUN: 25 mg/dL — ABNORMAL HIGH (ref 6–23)
Calcium, Ion: 1.25 mmol/L (ref 1.12–1.32)
Chloride: 108 mEq/L (ref 96–112)
Creatinine, Ser: 2 mg/dL — ABNORMAL HIGH (ref 0.4–1.2)
Glucose, Bld: 104 mg/dL — ABNORMAL HIGH (ref 70–99)
HCT: 31 % — ABNORMAL LOW (ref 36.0–46.0)
Hemoglobin: 10.5 g/dL — ABNORMAL LOW (ref 12.0–15.0)
Potassium: 3.9 mEq/L (ref 3.5–5.1)
Sodium: 139 mEq/L (ref 135–145)
TCO2: 23 mmol/L (ref 0–100)

## 2010-11-06 LAB — DIFFERENTIAL
Basophils Absolute: 0 10*3/uL (ref 0.0–0.1)
Basophils Relative: 0 % (ref 0–1)
Eosinophils Absolute: 0.1 10*3/uL (ref 0.0–0.7)
Eosinophils Relative: 1 % (ref 0–5)
Lymphocytes Relative: 7 % — ABNORMAL LOW (ref 12–46)
Lymphs Abs: 0.7 10*3/uL (ref 0.7–4.0)
Monocytes Absolute: 0.3 10*3/uL (ref 0.1–1.0)
Monocytes Relative: 3 % (ref 3–12)
Neutro Abs: 8.1 10*3/uL — ABNORMAL HIGH (ref 1.7–7.7)
Neutrophils Relative %: 89 % — ABNORMAL HIGH (ref 43–77)

## 2010-11-07 ENCOUNTER — Encounter (HOSPITAL_COMMUNITY): Payer: Medicaid Other

## 2010-11-07 LAB — POCT HEMOGLOBIN-HEMACUE: Hemoglobin: 11.1 g/dL — ABNORMAL LOW (ref 12.0–15.0)

## 2010-11-09 IMAGING — CR DG CHEST 2V
2 series · 2 of 2 positions shown · non-contrast
Comparison: 05/05/2008.

CLINICAL DATA: Fever.  Kidney transplant.  Pain all over.

CHEST - 2 VIEW

[w chest pa]
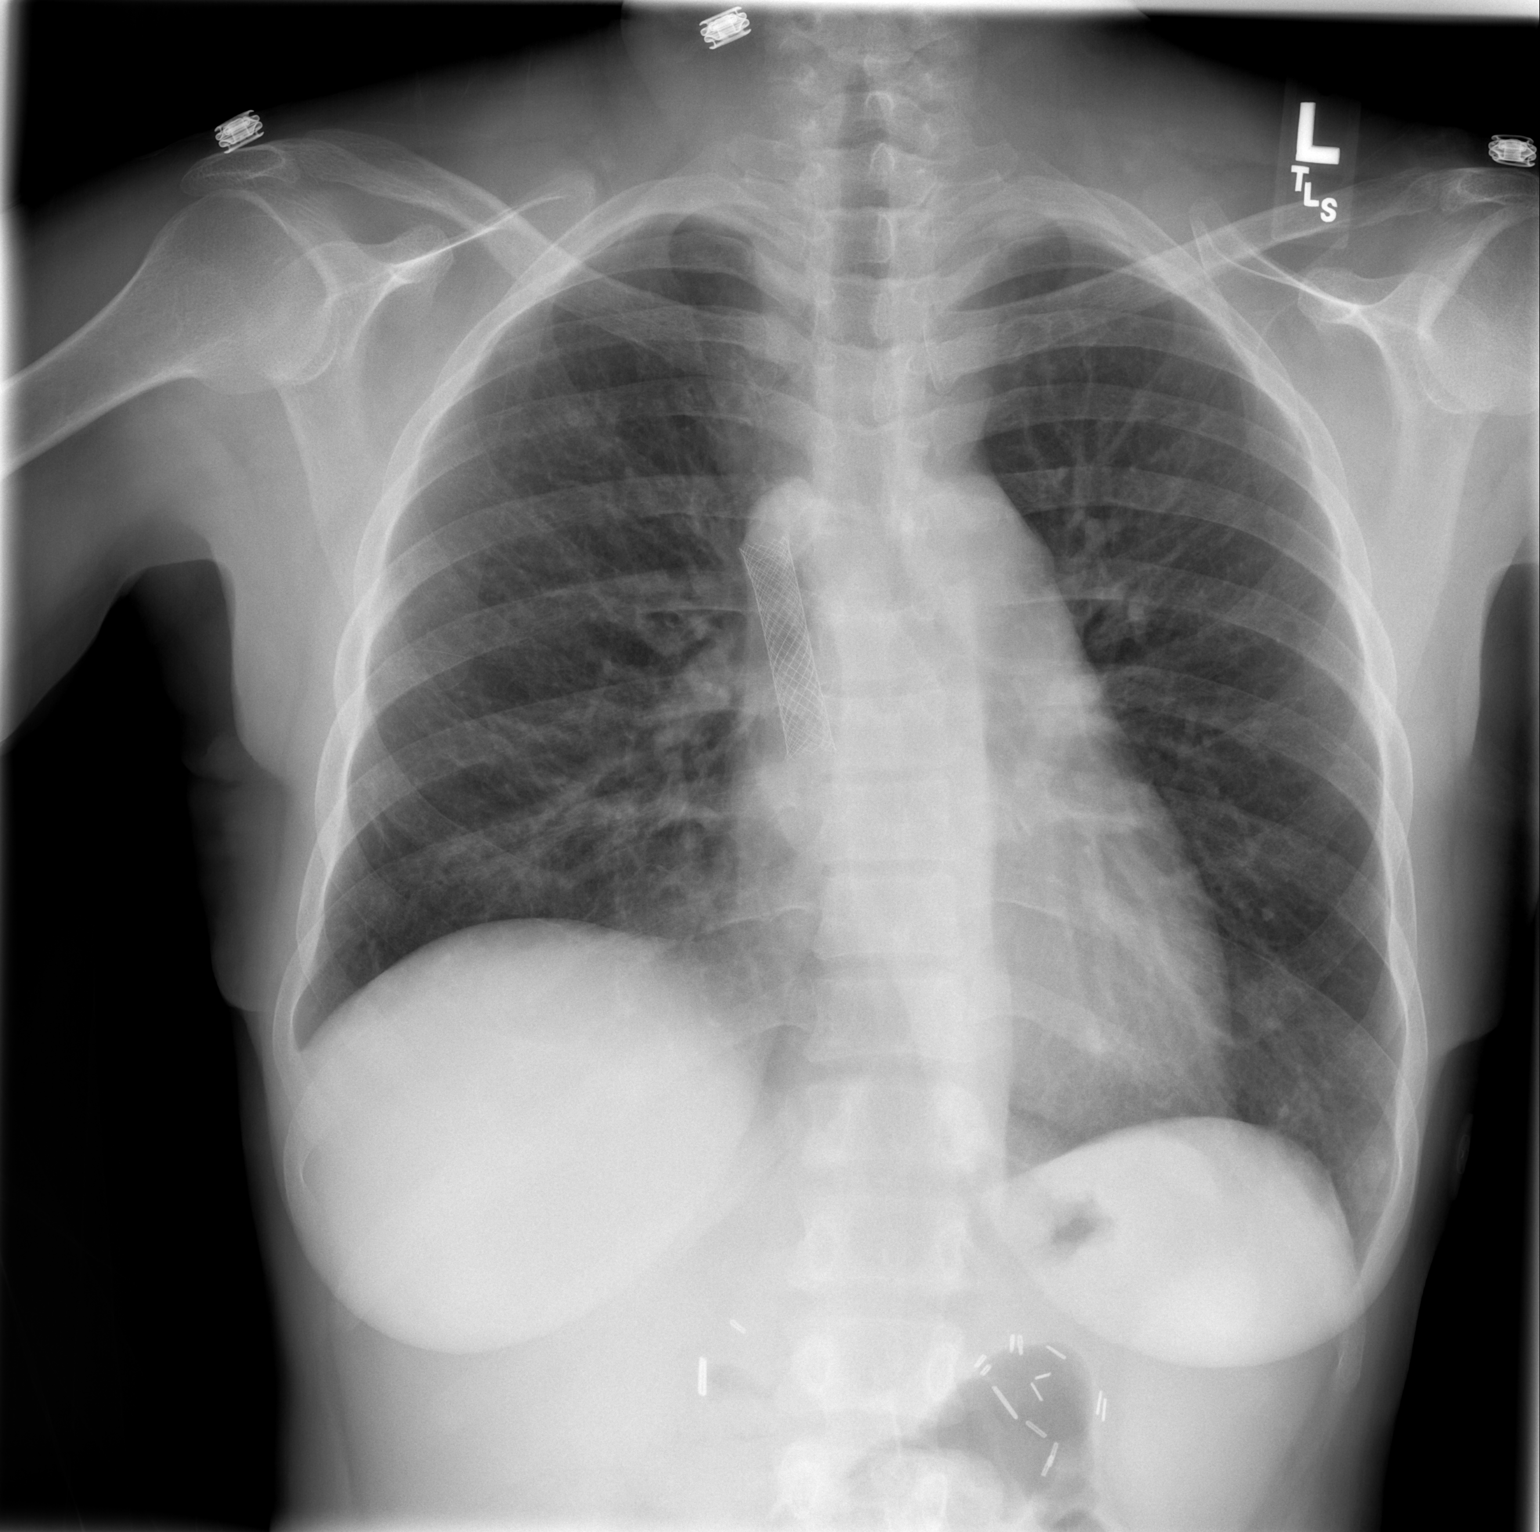

[w chest lat]
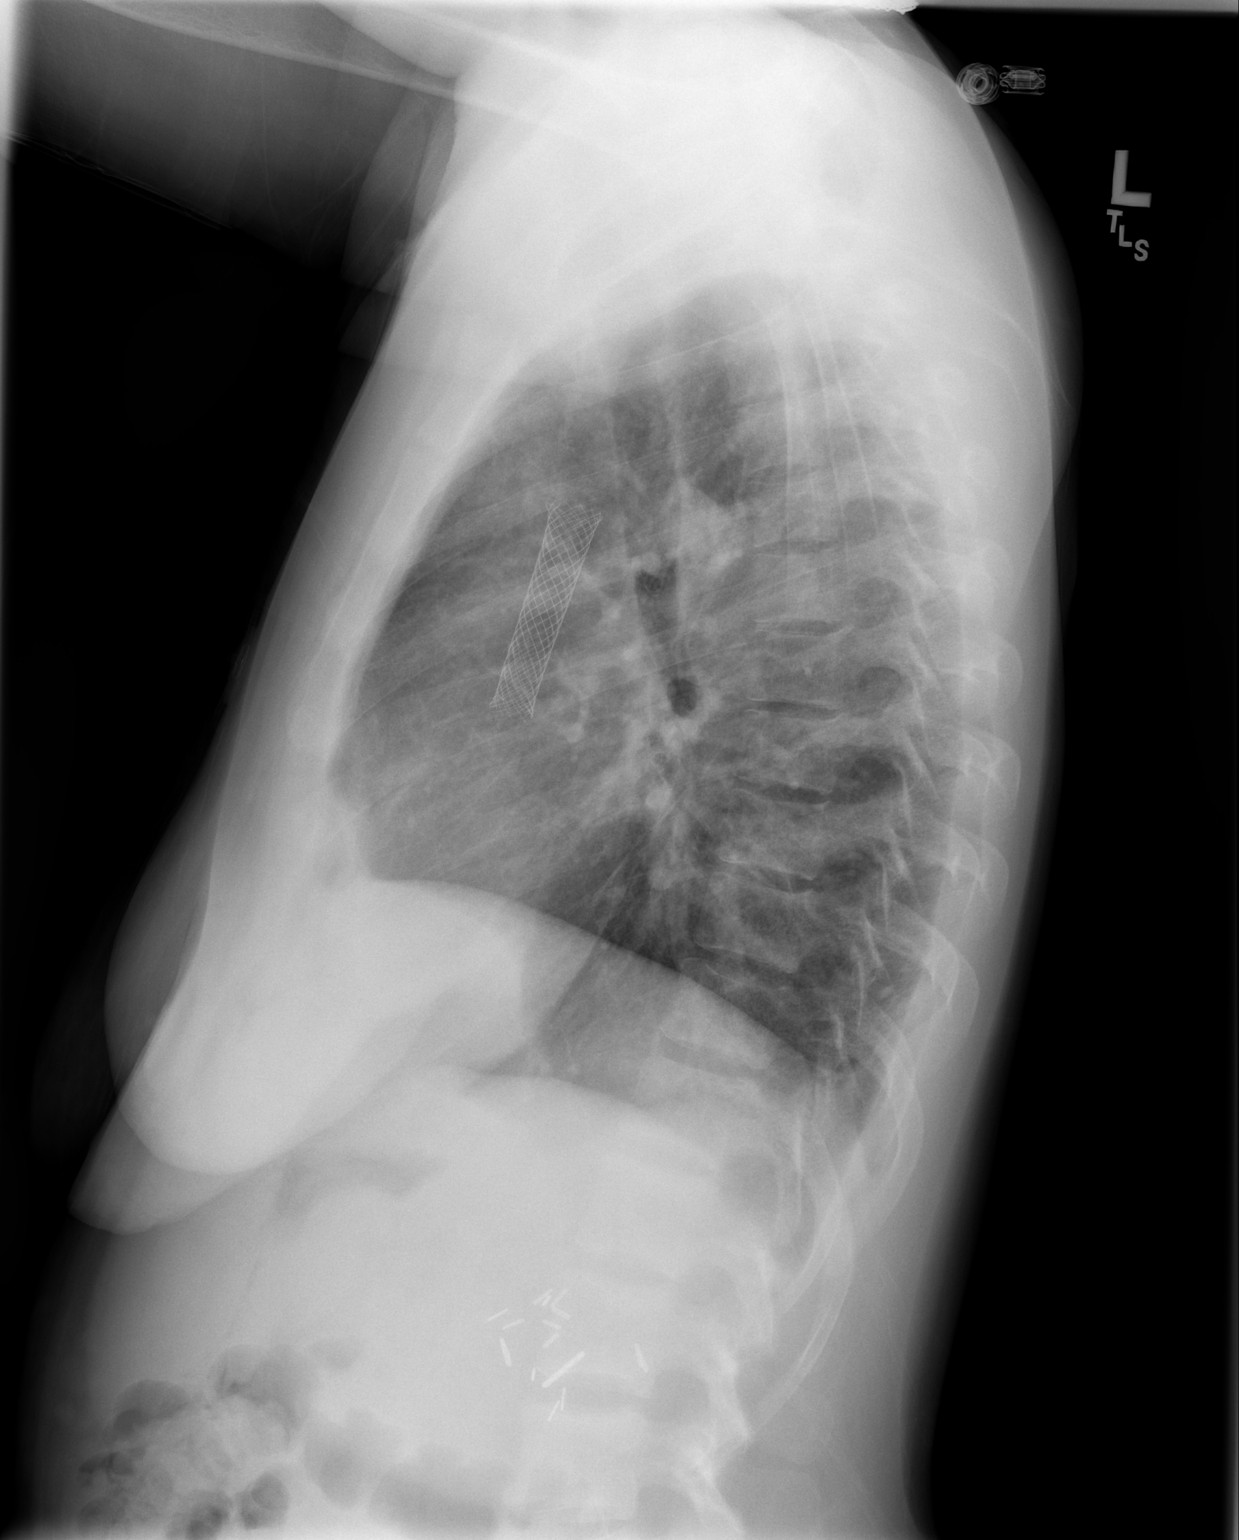

[2 of 2 positions shown; findings below may reference images not displayed]

FINDINGS: Superior vena cava stent is present.  Cardiopericardial
silhouette upper limits of normal.  Surgical clips are identified
in the abdomen.  There is subtle patchy opacity in the right middle
lobe, suspicious for early infection.  No pleural effusion is
identified.  Calcified granulomas are identified at the left lung
base.
IMPRESSION: 1.  Subtle patchy opacity at the medial right lung base suspicious
for infection.
2.  Unchanged SVC stent and postoperative changes in the abdomen.

## 2010-11-10 ENCOUNTER — Other Ambulatory Visit: Payer: Self-pay | Admitting: Nephrology

## 2010-11-10 ENCOUNTER — Encounter (HOSPITAL_COMMUNITY)
Admission: RE | Admit: 2010-11-10 | Discharge: 2010-11-10 | Disposition: A | Discharge status: Home / Self Care | Payer: Medicaid Other | Source: Ambulatory Visit | Attending: Nephrology | Admitting: Nephrology

## 2010-11-10 DIAGNOSIS — N183 Chronic kidney disease, stage 3 unspecified: Secondary | ICD-10-CM | POA: Insufficient documentation

## 2010-11-10 DIAGNOSIS — D638 Anemia in other chronic diseases classified elsewhere: Secondary | ICD-10-CM | POA: Insufficient documentation

## 2010-11-10 LAB — POCT HEMOGLOBIN-HEMACUE: Hemoglobin: 10.2 g/dL — ABNORMAL LOW (ref 12.0–15.0)

## 2010-11-10 LAB — IRON AND TIBC
Iron: 56 ug/dL (ref 42–135)
Saturation Ratios: 26 % (ref 20–55)
TIBC: 214 ug/dL — ABNORMAL LOW (ref 250–470)
UIBC: 158 ug/dL (ref 125–400)

## 2010-11-11 LAB — FERRITIN: Ferritin: 1536 ng/mL — ABNORMAL HIGH (ref 10–291)

## 2010-11-11 IMAGING — US US RENAL TRANSPLANT
1 series · 14 of 15 positions shown · non-contrast
Comparison: 03/28/2008.

CLINICAL DATA: Pyelonephritis.

ULTRASOUND RENAL TRANSPLANT

[Series 1: us renal transplant · 0.20mm/px · 14 of 15 slices shown]
[im 1/15]
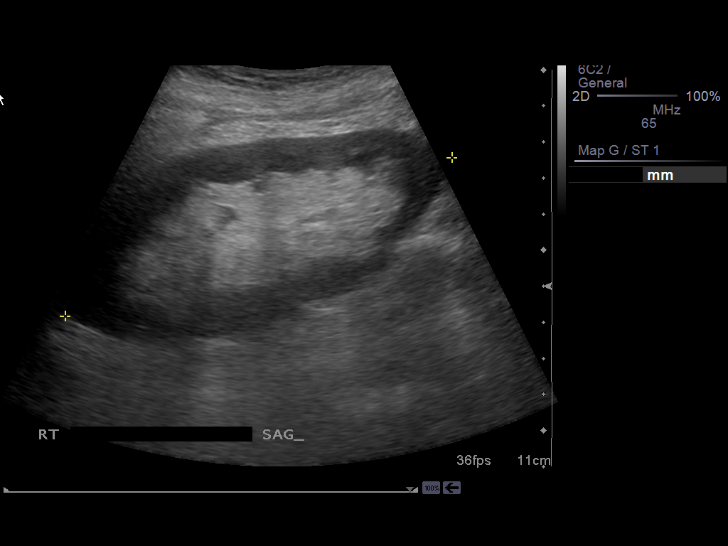
[im 2/15]
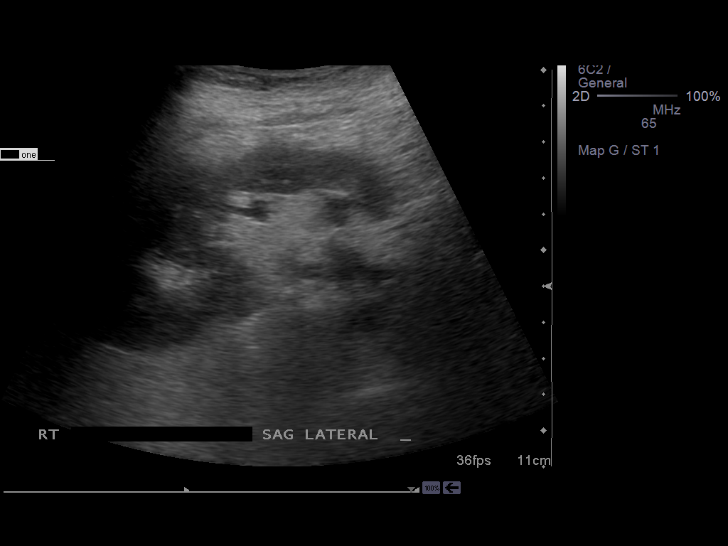
[im 3/15]
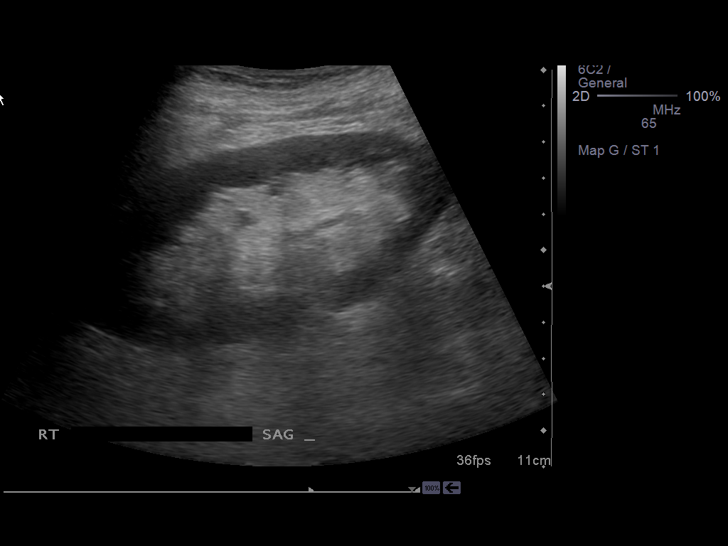
[im 4/15]
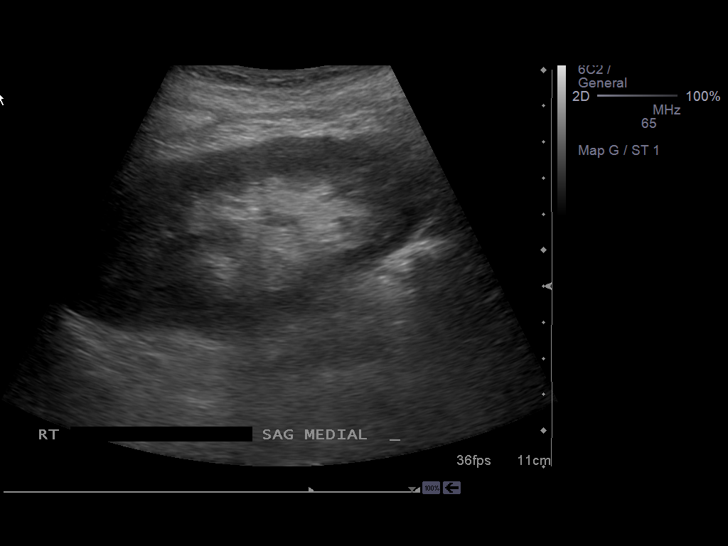
[im 5/15]
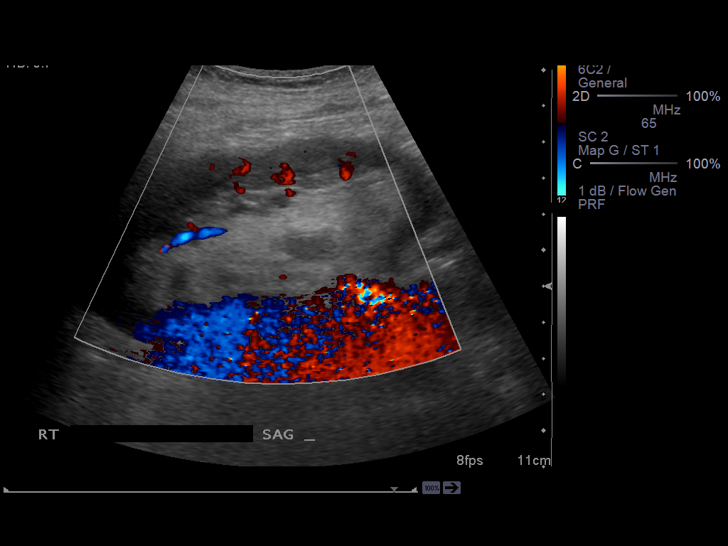
[im 6/15]
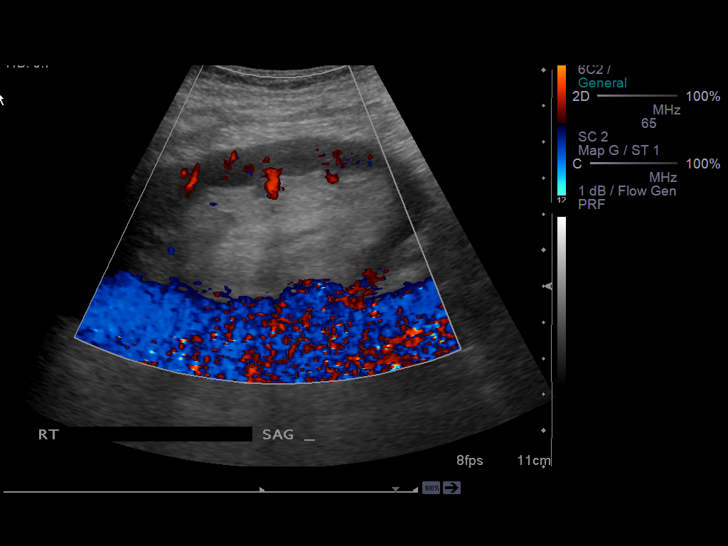
[im 7/15]
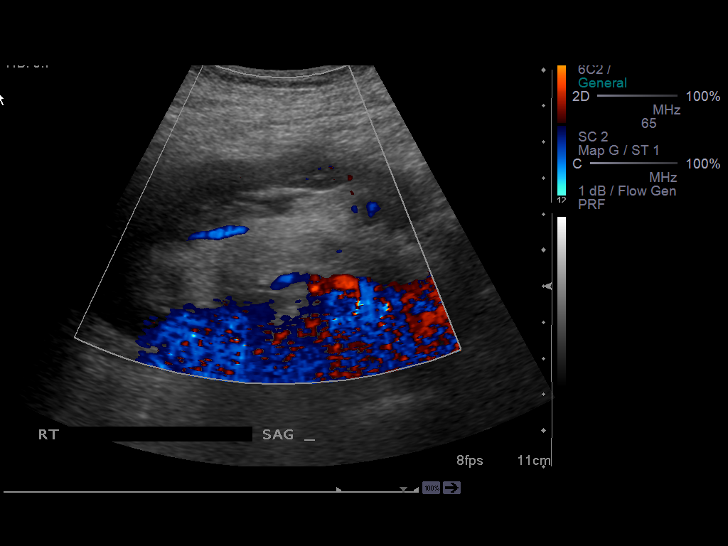
[im 9/15]
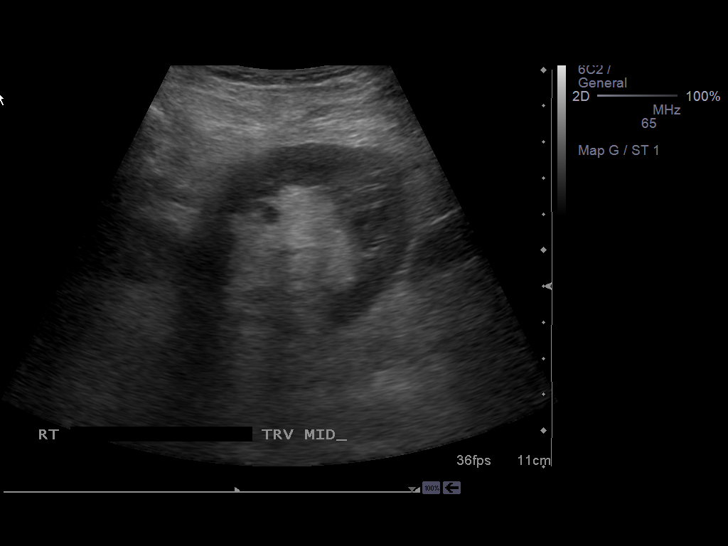
[im 10/15]
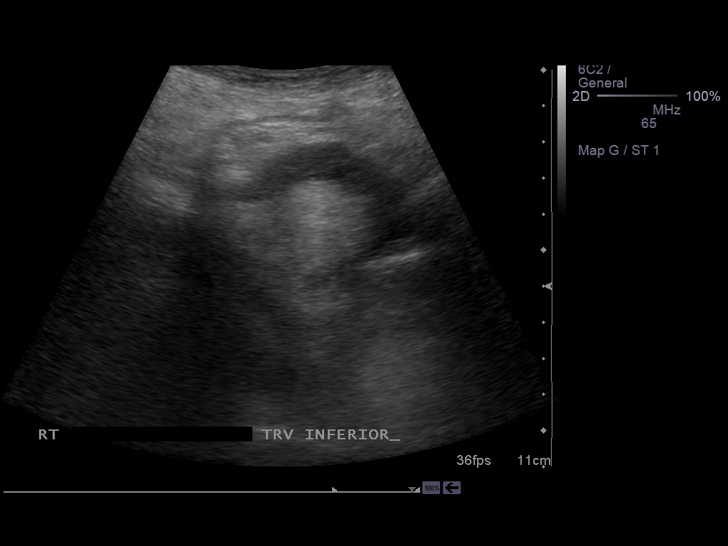
[im 11/15]
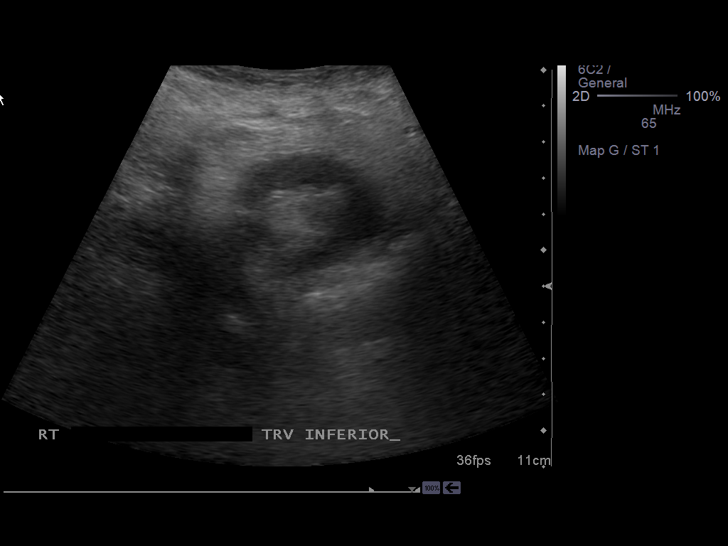
[im 12/15]
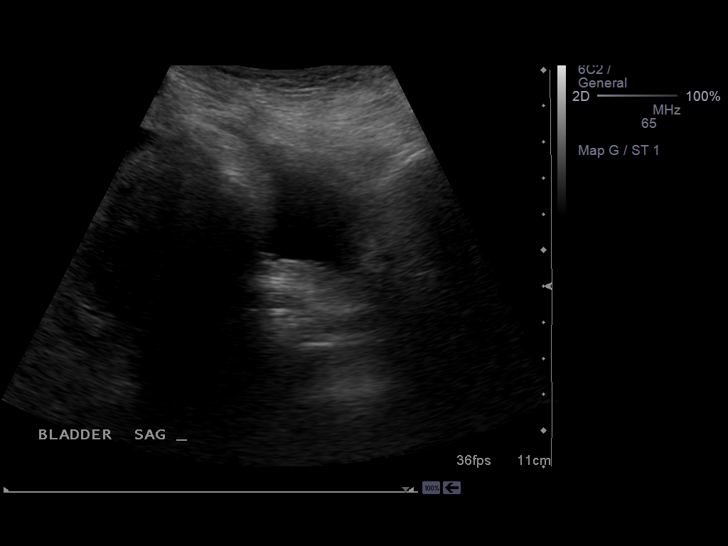
[im 13/15]
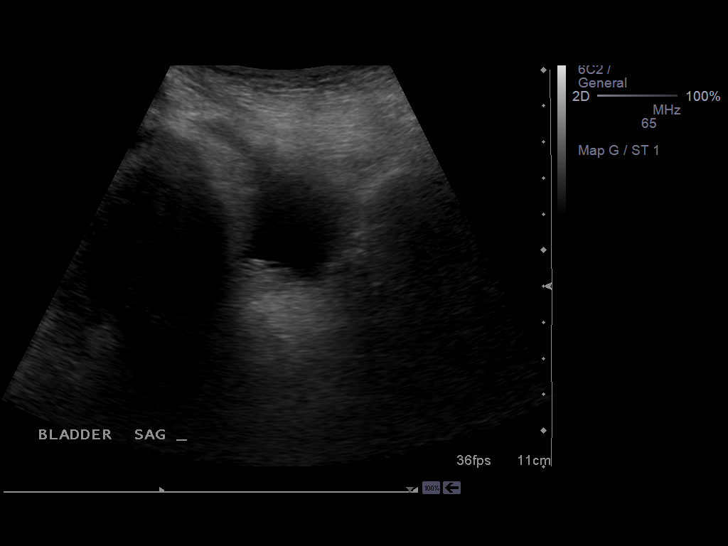
[im 14/15]
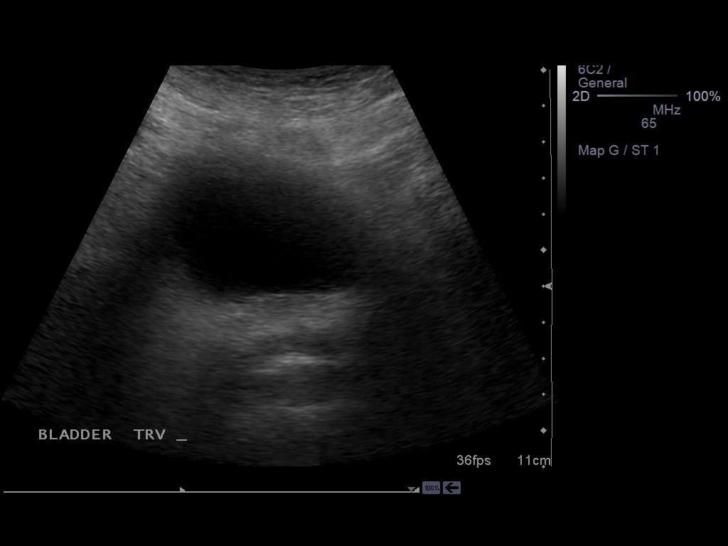
[im 15/15]
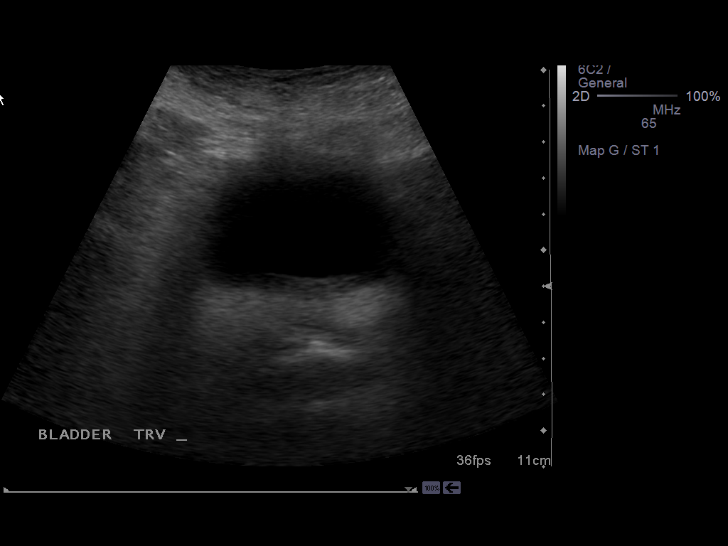

[14 of 15 positions shown; findings below may reference images not displayed]

FINDINGS: The transplant kidney is identified in the right anatomic
pelvis.  It measures 11.6 cm in long axis.  Renal cortex is
hypoechoic relative to the central sinus.  There is no
hydronephrosis.  No evidence for renal mass.  No fluid collection
is seen around the renal transplant.  The color Doppler evaluation
shows flow within the transplant kidney.

Bladder is unremarkable.
IMPRESSION: No evidence for hydronephrosis or perinephric fluid collection
associated with the transplant kidney.

## 2010-11-13 LAB — RAPID STREP SCREEN (MED CTR MEBANE ONLY): Streptococcus, Group A Screen (Direct): NEGATIVE

## 2010-12-05 ENCOUNTER — Other Ambulatory Visit: Payer: Self-pay | Admitting: Nephrology

## 2010-12-05 ENCOUNTER — Encounter (HOSPITAL_COMMUNITY): Payer: Medicaid Other

## 2010-12-05 DIAGNOSIS — D638 Anemia in other chronic diseases classified elsewhere: Secondary | ICD-10-CM | POA: Insufficient documentation

## 2010-12-05 DIAGNOSIS — N183 Chronic kidney disease, stage 3 unspecified: Secondary | ICD-10-CM | POA: Insufficient documentation

## 2010-12-05 LAB — IRON AND TIBC
Iron: 72 ug/dL (ref 42–135)
Saturation Ratios: 39 % (ref 20–55)
TIBC: 185 ug/dL — ABNORMAL LOW (ref 250–470)
UIBC: 113 ug/dL — ABNORMAL LOW (ref 125–400)

## 2010-12-05 LAB — POCT HEMOGLOBIN-HEMACUE: Hemoglobin: 9.2 g/dL — ABNORMAL LOW (ref 12.0–15.0)

## 2010-12-06 LAB — FERRITIN: Ferritin: 1726 ng/mL — ABNORMAL HIGH (ref 10–291)

## 2010-12-17 ENCOUNTER — Other Ambulatory Visit (HOSPITAL_COMMUNITY): Payer: Self-pay | Admitting: *Deleted

## 2010-12-19 ENCOUNTER — Encounter (HOSPITAL_COMMUNITY)
Admission: RE | Admit: 2010-12-19 | Discharge: 2010-12-19 | Disposition: A | Discharge status: Home / Self Care | Payer: Medicaid Other | Source: Ambulatory Visit | Attending: Nephrology | Admitting: Nephrology

## 2010-12-19 MED ORDER — EPOETIN ALFA 20000 UNIT/ML IJ SOLN
INTRAMUSCULAR | Status: AC
  Filled 2010-12-19: qty 1

## 2010-12-19 MED ORDER — EPOETIN ALFA 10000 UNIT/ML IJ SOLN
30000.0000 [IU] | INTRAMUSCULAR | Status: DC
  Administered 2010-12-19: 30000 [IU] via SUBCUTANEOUS

## 2011-01-08 ENCOUNTER — Other Ambulatory Visit (HOSPITAL_COMMUNITY): Payer: Self-pay | Admitting: *Deleted

## 2011-01-09 ENCOUNTER — Encounter (HOSPITAL_COMMUNITY): Payer: Medicaid Other

## 2011-01-09 DIAGNOSIS — N183 Chronic kidney disease, stage 3 unspecified: Secondary | ICD-10-CM | POA: Insufficient documentation

## 2011-01-09 DIAGNOSIS — D638 Anemia in other chronic diseases classified elsewhere: Secondary | ICD-10-CM | POA: Insufficient documentation

## 2011-01-13 ENCOUNTER — Encounter (HOSPITAL_COMMUNITY)
Admission: RE | Admit: 2011-01-13 | Discharge: 2011-01-13 | Disposition: A | Discharge status: Home / Self Care | Payer: Medicaid Other | Source: Ambulatory Visit | Attending: Nephrology | Admitting: Nephrology

## 2011-01-13 LAB — POCT HEMOGLOBIN-HEMACUE: Hemoglobin: 9.7 g/dL — ABNORMAL LOW (ref 12.0–15.0)

## 2011-01-13 LAB — IRON AND TIBC
Iron: 68 ug/dL (ref 42–135)
Saturation Ratios: 33 % (ref 20–55)
TIBC: 204 ug/dL — ABNORMAL LOW (ref 250–470)
UIBC: 136 ug/dL (ref 125–400)

## 2011-01-13 MED ORDER — EPOETIN ALFA 40000 UNIT/ML IJ SOLN
INTRAMUSCULAR | Status: AC
  Administered 2011-01-13: 13:00:00 via SUBCUTANEOUS
  Filled 2011-01-13: qty 1

## 2011-01-14 LAB — FERRITIN: Ferritin: 1404 ng/mL — ABNORMAL HIGH (ref 10–291)

## 2011-01-14 LAB — TACROLIMUS LEVEL: Tacrolimus (FK506) - LabCorp: 6.1 ng/mL

## 2011-02-06 ENCOUNTER — Encounter (HOSPITAL_COMMUNITY)
Admission: RE | Admit: 2011-02-06 | Discharge: 2011-02-06 | Disposition: A | Discharge status: Home / Self Care | Payer: Medicaid Other | Source: Ambulatory Visit | Attending: Nephrology | Admitting: Nephrology

## 2011-02-06 DIAGNOSIS — N183 Chronic kidney disease, stage 3 unspecified: Secondary | ICD-10-CM | POA: Insufficient documentation

## 2011-02-06 DIAGNOSIS — D638 Anemia in other chronic diseases classified elsewhere: Secondary | ICD-10-CM | POA: Insufficient documentation

## 2011-02-06 LAB — IRON AND TIBC
Iron: 60 ug/dL (ref 42–135)
Saturation Ratios: 29 % (ref 20–55)
TIBC: 206 ug/dL — ABNORMAL LOW (ref 250–470)
UIBC: 146 ug/dL (ref 125–400)

## 2011-02-06 LAB — POCT HEMOGLOBIN-HEMACUE: Hemoglobin: 11.2 g/dL — ABNORMAL LOW (ref 12.0–15.0)

## 2011-02-06 MED ORDER — EPOETIN ALFA 40000 UNIT/ML IJ SOLN
INTRAMUSCULAR | Status: AC
  Administered 2011-02-06: 40000 [IU] via SUBCUTANEOUS
  Filled 2011-02-06: qty 1

## 2011-02-06 MED ORDER — EPOETIN ALFA 10000 UNIT/ML IJ SOLN: 40000.0000 [IU] | INTRAMUSCULAR | Status: DC

## 2011-02-06 NOTE — Progress Notes (Signed)
Called to assess pt for assessment of suicide risk.  Pt denies any plan of hurting herself at this time, but admits to a previous attempt in the past when she was younger.  Pt states that she will always have thoughts of hurting herself because "of the life she has been given."  Pt refused info./referral to area mental health programs, but told CSW that she is aware that "help is out there." Pt stated that it was too late in her life to start therapy and she did not have the transportation/financial means to get to any more MD appointments.  CSW spoke to pt re: mobile crisis programs, county MH, and the ED as resources available to her if she felt suicidal.  Pt mentioned that her mother is very supportive to her and she feels like she could talk to her if she was in crisis.  CSW also encouraged pt to call 911 if necessary and come to the ED to be assessed for MH placement.  Emotional support offered.

## 2011-02-07 LAB — FERRITIN: Ferritin: 1493 ng/mL — ABNORMAL HIGH (ref 10–291)

## 2011-02-13 ENCOUNTER — Other Ambulatory Visit: Payer: Self-pay | Admitting: Gastroenterology

## 2011-02-19 ENCOUNTER — Other Ambulatory Visit: Payer: Self-pay | Admitting: Nephrology

## 2011-02-19 ENCOUNTER — Ambulatory Visit
Admission: RE | Admit: 2011-02-19 | Discharge: 2011-02-19 | Disposition: A | Discharge status: Home / Self Care | Payer: Medicaid Other | Source: Ambulatory Visit | Attending: Nephrology | Admitting: Nephrology

## 2011-02-19 DIAGNOSIS — R059 Cough, unspecified: Secondary | ICD-10-CM

## 2011-02-19 DIAGNOSIS — R05 Cough: Secondary | ICD-10-CM

## 2011-02-23 ENCOUNTER — Encounter: Payer: Self-pay | Admitting: Gastroenterology

## 2011-02-23 ENCOUNTER — Ambulatory Visit (INDEPENDENT_AMBULATORY_CARE_PROVIDER_SITE_OTHER): Payer: Medicaid Other | Admitting: Gastroenterology

## 2011-02-23 VITALS — BP 114/66 | HR 68 | Ht 59.0 in | Wt 131.0 lb

## 2011-02-23 DIAGNOSIS — R110 Nausea: Secondary | ICD-10-CM

## 2011-02-23 MED ORDER — METOCLOPRAMIDE HCL 5 MG PO TABS: 5.0000 mg | ORAL_TABLET | Freq: Every evening | ORAL | Status: DC | PRN

## 2011-02-23 NOTE — Progress Notes (Signed)
GI problem list:  1. Chronic nausea, vomiting. EGD August 2009 was normal except for small hiatal hernia. Status post kidney transplant on immunosuppressive medicines which could cause nausea, narcotics periodically. Normal complete metabolic profile, slight anemia, labs July 2009. Changing her Myfortic to Imuran helped nausea temporarily, symptoms returned. GES 03/2008 slightly slow emptying. [name] 2/201normal GB, normal bilary tree. Hospitalized 03/2008 [facility] MD put her on reglan 5mg  three times a day, GI was not consulted. March, 2010 combination of daily scheduled Zofran and t.i.d. Reglan 5 mg a day has been helpful. She was cut back to Reglan as needed to due concern for potential side effects.  June 2011: Was recommended to take 1-2 Reglan pills at bedtime only. Also started Megace for appetite stimulant.  January 2013: She has gained 20 pounds and her nausea is under much better control with nightly Reglan.   HPI: This is a  very pleasant 46 year old woman whom I last saw about 2 years ago.  She has gained about 20 pounds since her last visit 2 years ago  Took megace for past 1-2 years, recently stopped after 20 pound weight gain.    She takes 5mg  at bedtime and sometimes during the day.    She eats about 2 meals.    Past Medical History  Diagnosis Date  . Gastroparesis   . Hx of kidney transplant   . Gout   . Hypotension     Past Surgical History  Procedure Date  . Kidney transplant   . Appendectomy   . Eye surgery     Current Outpatient Prescriptions  Medication Sig Dispense Refill  . aspirin 81 MG tablet Take 81 mg by mouth daily.      [name] azaTHIOprine (IMURAN) 50 MG tablet Take 100 mg by mouth daily.      . calcitRIOL (ROCALTROL) 0.25 MCG capsule Take 0.25 mcg by mouth daily.      . calcium carbonate (TUMS EX) 750 MG chewable tablet Chew 1-2 tablets by mouth as needed.       . cetirizine (ZYRTEC) 10 MG tablet Take 10 mg by mouth as needed.       [name]  dextromethorphan-guaiFENesin (MUCINEX DM) 30-600 MG per 12 hr tablet Take 1 tablet by mouth as needed.       . diphenhydrAMINE (BENADRYL) 25 MG tablet Take 25 mg by mouth at bedtime as needed.       [name] epoetin alfa (EPOGEN,PROCRIT) 29562 UNIT/ML injection Inject 40,000 Units into the skin every 30 (thirty) days.       . Eszopiclone (ESZOPICLONE) 3 MG TABS Take 3 mg by mouth at bedtime. Take immediately before bedtime      . furosemide (LASIX) 40 MG tablet Take 40 mg by mouth daily.      . metoCLOPramide (REGLAN) 5 MG tablet Take 5 mg by mouth 3 (three) times daily.      . Multiple Vitamin (MULTIVITAMIN) capsule Take 1 capsule by mouth daily.      [name] omeprazole (PRILOSEC) 40 MG capsule Take 40 mg by mouth daily.      . ondansetron (ZOFRAN) 4 MG tablet Take 4 mg by mouth once.      [name] oxyCODONE-acetaminophen (PERCOCET) 10-650 MG per tablet Take 1 tablet by mouth every 6 (six) hours as needed.      . predniSONE (DELTASONE) 5 MG tablet Take 5 mg by mouth daily.      . promethazine (PHENERGAN) 25 MG tablet Take 25-50 mg by mouth every 6 (  six) hours as needed.      . rosuvastatin (CRESTOR) 5 MG tablet Take 5 mg by mouth daily.      . tacrolimus (PROGRAF) 1 MG capsule Take 2 mg by mouth at bedtime.      [name] zolpidem (AMBIEN) 10 MG tablet Take 10 mg by mouth at bedtime as needed.        Allergies as of 02/23/2011  . (No Known Allergies)    Family History  Problem Relation Age of Onset  . Hypertension Maternal Grandmother     History   Social History  . Marital Status: Single    Spouse Name: N/A    Number of Children: 0  . Years of Education: N/A   Occupational History  . disabiled    Social History Main Topics  . Smoking status: Current Everyday Smoker -- 0.5 packs/day    Types: Cigarettes  . Smokeless tobacco: Never Used  . Alcohol Use: Yes     rare  . Drug Use: Yes    Special: Marijuana  . Sexually Active: Not on file   Other Topics Concern  . Not on file   Social History  Narrative  . No narrative on file      Physical Exam: BP 114/66  Pulse 68  Ht 4\' 11"  (1.499 m)  Wt 131 lb (59.421 kg)  BMI 26.46 kg/m2 Constitutional: generally well-appearing Psychiatric: alert and oriented x3 Abdomen: soft, nontender, nondistended, no obvious ascites, no peritoneal signs, normal bowel sounds     Assessment and plan: 46 y.o. female with  chronic nausea from gastroparesis, some of her medicines  I am happy to refill her Reglan. She is on low dose, about 5-10 mg a day total. She will return here in one year and sooner if needed.

## 2011-02-23 NOTE — Patient Instructions (Addendum)
You have been given a separate informational sheet regarding your tobacco use, the importance of quitting and local resources to help you quit. Reglan refilled. Try eating several smaller meals a day (4 small meals rather than 2 larger meals).

## 2011-03-06 ENCOUNTER — Encounter (HOSPITAL_COMMUNITY)
Admission: RE | Admit: 2011-03-06 | Discharge: 2011-03-06 | Disposition: A | Discharge status: Home / Self Care | Payer: Medicaid Other | Source: Ambulatory Visit | Attending: Nephrology | Admitting: Nephrology

## 2011-03-06 DIAGNOSIS — N183 Chronic kidney disease, stage 3 unspecified: Secondary | ICD-10-CM | POA: Insufficient documentation

## 2011-03-06 DIAGNOSIS — D638 Anemia in other chronic diseases classified elsewhere: Secondary | ICD-10-CM | POA: Insufficient documentation

## 2011-03-06 LAB — IRON AND TIBC
Iron: 94 ug/dL (ref 42–135)
Saturation Ratios: 44 % (ref 20–55)
TIBC: 212 ug/dL — ABNORMAL LOW (ref 250–470)
UIBC: 118 ug/dL — ABNORMAL LOW (ref 125–400)

## 2011-03-06 LAB — POCT HEMOGLOBIN-HEMACUE: Hemoglobin: 10.7 g/dL — ABNORMAL LOW (ref 12.0–15.0)

## 2011-03-06 MED ORDER — EPOETIN ALFA 40000 UNIT/ML IJ SOLN
INTRAMUSCULAR | Status: AC
  Administered 2011-03-06: 40000 [IU] via SUBCUTANEOUS
  Filled 2011-03-06: qty 1

## 2011-03-06 MED ORDER — EPOETIN ALFA 10000 UNIT/ML IJ SOLN: 40000.0000 [IU] | INTRAMUSCULAR | Status: DC

## 2011-04-03 ENCOUNTER — Encounter (HOSPITAL_COMMUNITY): Payer: Medicaid Other

## 2011-04-06 ENCOUNTER — Encounter (HOSPITAL_COMMUNITY): Payer: Medicaid Other

## 2011-04-07 ENCOUNTER — Encounter (HOSPITAL_COMMUNITY)
Admission: RE | Admit: 2011-04-07 | Discharge: 2011-04-07 | Disposition: A | Discharge status: Home / Self Care | Payer: Medicaid Other | Source: Ambulatory Visit | Attending: Nephrology | Admitting: Nephrology

## 2011-04-07 DIAGNOSIS — N183 Chronic kidney disease, stage 3 unspecified: Secondary | ICD-10-CM | POA: Insufficient documentation

## 2011-04-07 DIAGNOSIS — D638 Anemia in other chronic diseases classified elsewhere: Secondary | ICD-10-CM | POA: Insufficient documentation

## 2011-04-07 LAB — IRON AND TIBC
Iron: 64 ug/dL (ref 42–135)
Saturation Ratios: 37 % (ref 20–55)
TIBC: 175 ug/dL — ABNORMAL LOW (ref 250–470)
UIBC: 111 ug/dL — ABNORMAL LOW (ref 125–400)

## 2011-04-07 LAB — FERRITIN: Ferritin: 1209 ng/mL — ABNORMAL HIGH (ref 10–291)

## 2011-04-07 LAB — URIC ACID: Uric Acid, Serum: 11.3 mg/dL — ABNORMAL HIGH (ref 2.4–7.0)

## 2011-04-07 LAB — POCT HEMOGLOBIN-HEMACUE: Hemoglobin: 10.4 g/dL — ABNORMAL LOW (ref 12.0–15.0)

## 2011-04-07 MED ORDER — EPOETIN ALFA 40000 UNIT/ML IJ SOLN
INTRAMUSCULAR | Status: AC
  Administered 2011-04-07: 40000 [IU] via SUBCUTANEOUS
  Filled 2011-04-07: qty 1

## 2011-04-07 MED ORDER — EPOETIN ALFA 10000 UNIT/ML IJ SOLN: 40000.0000 [IU] | INTRAMUSCULAR | Status: DC

## 2011-04-07 NOTE — Progress Notes (Signed)
Patient responded that she will always say yes to the suicide questions, but did not feel like harming herself today. States she has felt like this within the last month but as already been seen by a Education officer, museum here. Social Worker Jarrett Soho was called today and informed of patient's answers. Jarrett Soho said she would read patient's records and if she needed to see patient today she would call right back. She said she would look to see if she thought it necessary to start a care plan on patient, and that it was probably not necessary for [name] to call every time as long as patient has a plan for times when she feels like she may harm or kill herself.

## 2011-05-05 ENCOUNTER — Other Ambulatory Visit (HOSPITAL_COMMUNITY): Payer: Self-pay | Admitting: *Deleted

## 2011-05-08 ENCOUNTER — Encounter (HOSPITAL_COMMUNITY): Payer: Medicaid Other

## 2011-05-22 ENCOUNTER — Inpatient Hospital Stay (HOSPITAL_COMMUNITY)
Admission: EM | Admit: 2011-05-22 | Discharge: 2011-05-30 | DRG: 871 | Disposition: A | Discharge status: Home / Self Care | Payer: Medicaid Other | Source: Ambulatory Visit | Attending: Internal Medicine | Admitting: Internal Medicine

## 2011-05-22 ENCOUNTER — Emergency Department (HOSPITAL_COMMUNITY): Payer: Medicaid Other

## 2011-05-22 ENCOUNTER — Encounter (HOSPITAL_COMMUNITY): Payer: Self-pay | Admitting: Physical Medicine and Rehabilitation

## 2011-05-22 ENCOUNTER — Inpatient Hospital Stay (HOSPITAL_COMMUNITY): Payer: Medicaid Other

## 2011-05-22 DIAGNOSIS — IMO0002 Reserved for concepts with insufficient information to code with codable children: Secondary | ICD-10-CM

## 2011-05-22 DIAGNOSIS — M109 Gout, unspecified: Secondary | ICD-10-CM | POA: Diagnosis present

## 2011-05-22 DIAGNOSIS — Z7982 Long term (current) use of aspirin: Secondary | ICD-10-CM

## 2011-05-22 DIAGNOSIS — R634 Abnormal weight loss: Secondary | ICD-10-CM

## 2011-05-22 DIAGNOSIS — M25469 Effusion, unspecified knee: Secondary | ICD-10-CM | POA: Diagnosis not present

## 2011-05-22 DIAGNOSIS — L270 Generalized skin eruption due to drugs and medicaments taken internally: Secondary | ICD-10-CM | POA: Diagnosis present

## 2011-05-22 DIAGNOSIS — T368X5A Adverse effect of other systemic antibiotics, initial encounter: Secondary | ICD-10-CM | POA: Diagnosis present

## 2011-05-22 DIAGNOSIS — R6521 Severe sepsis with septic shock: Secondary | ICD-10-CM | POA: Diagnosis present

## 2011-05-22 DIAGNOSIS — F411 Generalized anxiety disorder: Secondary | ICD-10-CM | POA: Diagnosis present

## 2011-05-22 DIAGNOSIS — N179 Acute kidney failure, unspecified: Secondary | ICD-10-CM | POA: Diagnosis present

## 2011-05-22 DIAGNOSIS — R059 Cough, unspecified: Secondary | ICD-10-CM | POA: Diagnosis not present

## 2011-05-22 DIAGNOSIS — R6520 Severe sepsis without septic shock: Secondary | ICD-10-CM | POA: Diagnosis present

## 2011-05-22 DIAGNOSIS — A419 Sepsis, unspecified organism: Secondary | ICD-10-CM | POA: Diagnosis present

## 2011-05-22 DIAGNOSIS — K3184 Gastroparesis: Secondary | ICD-10-CM

## 2011-05-22 DIAGNOSIS — E8779 Other fluid overload: Secondary | ICD-10-CM | POA: Diagnosis not present

## 2011-05-22 DIAGNOSIS — L989 Disorder of the skin and subcutaneous tissue, unspecified: Secondary | ICD-10-CM | POA: Diagnosis not present

## 2011-05-22 DIAGNOSIS — D638 Anemia in other chronic diseases classified elsewhere: Secondary | ICD-10-CM | POA: Diagnosis present

## 2011-05-22 DIAGNOSIS — A4151 Sepsis due to Escherichia coli [E. coli]: Principal | ICD-10-CM | POA: Diagnosis present

## 2011-05-22 DIAGNOSIS — R112 Nausea with vomiting, unspecified: Secondary | ICD-10-CM | POA: Insufficient documentation

## 2011-05-22 DIAGNOSIS — R05 Cough: Secondary | ICD-10-CM | POA: Diagnosis not present

## 2011-05-22 DIAGNOSIS — B370 Candidal stomatitis: Secondary | ICD-10-CM | POA: Diagnosis not present

## 2011-05-22 DIAGNOSIS — F17200 Nicotine dependence, unspecified, uncomplicated: Secondary | ICD-10-CM | POA: Diagnosis present

## 2011-05-22 DIAGNOSIS — Z940 Kidney transplant status: Secondary | ICD-10-CM

## 2011-05-22 DIAGNOSIS — R7881 Bacteremia: Secondary | ICD-10-CM

## 2011-05-22 DIAGNOSIS — N12 Tubulo-interstitial nephritis, not specified as acute or chronic: Secondary | ICD-10-CM | POA: Diagnosis present

## 2011-05-22 DIAGNOSIS — R109 Unspecified abdominal pain: Secondary | ICD-10-CM

## 2011-05-22 DIAGNOSIS — N189 Chronic kidney disease, unspecified: Secondary | ICD-10-CM | POA: Diagnosis present

## 2011-05-22 LAB — POCT I-STAT 3, ART BLOOD GAS (G3+)
Acid-base deficit: 6 mmol/L — ABNORMAL HIGH (ref 0.0–2.0)
Bicarbonate: 18.5 mEq/L — ABNORMAL LOW (ref 20.0–24.0)
O2 Saturation: 97 %
TCO2: 19 mmol/L (ref 0–100)
pCO2 arterial: 30.6 mmHg — ABNORMAL LOW (ref 35.0–45.0)
pH, Arterial: 7.39 (ref 7.350–7.400)
pO2, Arterial: 88 mmHg (ref 80.0–100.0)

## 2011-05-22 LAB — CBC
HCT: 31.8 % — ABNORMAL LOW (ref 36.0–46.0)
Hemoglobin: 11 g/dL — ABNORMAL LOW (ref 12.0–15.0)
MCH: 31.8 pg (ref 26.0–34.0)
MCHC: 34.6 g/dL (ref 30.0–36.0)
MCV: 91.9 fL (ref 78.0–100.0)
Platelets: UNDETERMINED 10*3/uL (ref 150–400)
RBC: 3.46 MIL/uL — ABNORMAL LOW (ref 3.87–5.11)
RDW: 20.1 % — ABNORMAL HIGH (ref 11.5–15.5)
WBC: 10 10*3/uL (ref 4.0–10.5)

## 2011-05-22 LAB — URINALYSIS, ROUTINE W REFLEX MICROSCOPIC
Glucose, UA: NEGATIVE mg/dL
Ketones, ur: 15 mg/dL — AB
Nitrite: NEGATIVE
Protein, ur: >300 mg/dL — AB
Specific Gravity, Urine: 1.014 (ref 1.005–1.030)
Urobilinogen, UA: 1 mg/dL (ref 0.0–1.0)
pH: 6.5 (ref 5.0–8.0)

## 2011-05-22 LAB — COMPREHENSIVE METABOLIC PANEL
ALT: 11 U/L (ref 0–35)
AST: 30 U/L (ref 0–37)
Albumin: 3.5 g/dL (ref 3.5–5.2)
Alkaline Phosphatase: 75 U/L (ref 39–117)
BUN: 38 mg/dL — ABNORMAL HIGH (ref 6–23)
CO2: 18 mEq/L — ABNORMAL LOW (ref 19–32)
Calcium: 9.9 mg/dL (ref 8.4–10.5)
Chloride: 98 mEq/L (ref 96–112)
Creatinine, Ser: 2.89 mg/dL — ABNORMAL HIGH (ref 0.50–1.10)
GFR calc Af Amer: 22 mL/min — ABNORMAL LOW (ref >90)
GFR calc non Af Amer: 19 mL/min — ABNORMAL LOW (ref >90)
Glucose, Bld: 89 mg/dL (ref 70–99)
Potassium: 4.5 mEq/L (ref 3.5–5.1)
Sodium: 133 mEq/L — ABNORMAL LOW (ref 135–145)
Total Bilirubin: 1.3 mg/dL — ABNORMAL HIGH (ref 0.3–1.2)
Total Protein: 6.7 g/dL (ref 6.0–8.3)

## 2011-05-22 LAB — DIFFERENTIAL
Basophils Absolute: 0 10*3/uL (ref 0.0–0.1)
Basophils Relative: 0 % (ref 0–1)
Eosinophils Absolute: 0 10*3/uL (ref 0.0–0.7)
Eosinophils Relative: 0 % (ref 0–5)
Lymphocytes Relative: 3 % — ABNORMAL LOW (ref 12–46)
Lymphs Abs: 0.3 10*3/uL — ABNORMAL LOW (ref 0.7–4.0)
Monocytes Absolute: 0.5 10*3/uL (ref 0.1–1.0)
Monocytes Relative: 5 % (ref 3–12)
Neutro Abs: 9.2 10*3/uL — ABNORMAL HIGH (ref 1.7–7.7)
Neutrophils Relative %: 92 % — ABNORMAL HIGH (ref 43–77)

## 2011-05-22 LAB — CARDIAC PANEL(CRET KIN+CKTOT+MB+TROPI)
CK, MB: 1.8 ng/mL (ref 0.3–4.0)
Relative Index: INVALID (ref 0.0–2.5)
Total CK: 81 U/L (ref 7–177)
Troponin I: <0.3 ng/mL (ref <0.30)

## 2011-05-22 LAB — CORTISOL: Cortisol, Plasma: 147.4 ug/dL

## 2011-05-22 LAB — URINE MICROSCOPIC-ADD ON

## 2011-05-22 LAB — LACTIC ACID, PLASMA: Lactic Acid, Venous: 2.8 mmol/L — ABNORMAL HIGH (ref 0.5–2.2)

## 2011-05-22 LAB — CARBOXYHEMOGLOBIN
Carboxyhemoglobin: 1.7 % — ABNORMAL HIGH (ref 0.5–1.5)
Carboxyhemoglobin: 2.2 % — ABNORMAL HIGH (ref 0.5–1.5)
Carboxyhemoglobin: 2.4 % — ABNORMAL HIGH (ref 0.5–1.5)
Methemoglobin: 1.7 % — ABNORMAL HIGH (ref 0.0–1.5)
Methemoglobin: 1.6 % — ABNORMAL HIGH (ref 0.0–1.5)
Methemoglobin: 1.6 % — ABNORMAL HIGH (ref 0.0–1.5)
O2 Saturation: 31.9 %
O2 Saturation: 65.8 %
O2 Saturation: 73 %
Total hemoglobin: 9.3 g/dL — ABNORMAL LOW (ref 12.5–16.0)
Total hemoglobin: 10.2 g/dL — ABNORMAL LOW (ref 12.5–16.0)
Total hemoglobin: 9.5 g/dL — ABNORMAL LOW (ref 12.5–16.0)

## 2011-05-22 LAB — LIPASE, BLOOD: Lipase: 20 U/L (ref 11–59)

## 2011-05-22 LAB — GLUCOSE, CAPILLARY: Glucose-Capillary: 119 mg/dL — ABNORMAL HIGH (ref 70–99)

## 2011-05-22 LAB — MRSA PCR SCREENING: MRSA by PCR: NEGATIVE

## 2011-05-22 LAB — PROCALCITONIN: Procalcitonin: 46.29 ng/mL

## 2011-05-22 MED ORDER — PREDNISONE 5 MG PO TABS
5.0000 mg | ORAL_TABLET | Freq: Every day | ORAL | Status: DC
  Administered 2011-05-23 – 2011-05-27 (×5): 5 mg via ORAL
  Filled 2011-05-22 (×5): qty 1

## 2011-05-22 MED ORDER — SODIUM CHLORIDE 0.9 % IV BOLUS (SEPSIS): 500.0000 mL | Freq: Once | INTRAVENOUS | Status: AC

## 2011-05-22 MED ORDER — SODIUM CHLORIDE 0.9 % IV SOLN
INTRAVENOUS | Status: DC
  Administered 2011-05-22: 21:00:00 via INTRAVENOUS
  Administered 2011-05-22: 100 mL/h via INTRAVENOUS
  Administered 2011-05-24: 500 mL via INTRAVENOUS
  Administered 2011-05-25: 23:00:00 via INTRAVENOUS

## 2011-05-22 MED ORDER — DEXTROSE 5 % IV SOLN
1.0000 g | INTRAVENOUS | Status: DC
  Administered 2011-05-23: 1 g via INTRAVENOUS
  Filled 2011-05-22 (×2): qty 1

## 2011-05-22 MED ORDER — LEVOFLOXACIN IN D5W 250 MG/50ML IV SOLN: 250.0000 mg | INTRAVENOUS | Status: DC

## 2011-05-22 MED ORDER — SODIUM CHLORIDE 0.9 % IV BOLUS (SEPSIS)
1000.0000 mL | Freq: Once | INTRAVENOUS | Status: AC
  Administered 2011-05-22: 1000 mL via INTRAVENOUS

## 2011-05-22 MED ORDER — DIPHENHYDRAMINE HCL 50 MG/ML IJ SOLN
12.5000 mg | Freq: Four times a day (QID) | INTRAMUSCULAR | Status: DC | PRN
  Administered 2011-05-22 – 2011-05-25 (×4): 12.5 mg via INTRAVENOUS
  Filled 2011-05-22 (×5): qty 1

## 2011-05-22 MED ORDER — HEPARIN SODIUM (PORCINE) 5000 UNIT/ML IJ SOLN
5000.0000 [IU] | Freq: Three times a day (TID) | INTRAMUSCULAR | Status: DC
  Administered 2011-05-22 – 2011-05-30 (×23): 5000 [IU] via SUBCUTANEOUS
  Filled 2011-05-22 (×26): qty 1

## 2011-05-22 MED ORDER — PROMETHAZINE HCL 25 MG/ML IJ SOLN
12.5000 mg | Freq: Once | INTRAMUSCULAR | Status: DC
  Filled 2011-05-22: qty 1

## 2011-05-22 MED ORDER — SODIUM CHLORIDE 0.9 % IV BOLUS (SEPSIS): 25.0000 mL/kg | Freq: Once | INTRAVENOUS | Status: AC

## 2011-05-22 MED ORDER — ONDANSETRON HCL 4 MG/2ML IJ SOLN
INTRAMUSCULAR | Status: AC
  Filled 2011-05-22: qty 2

## 2011-05-22 MED ORDER — NOREPINEPHRINE BITARTRATE 1 MG/ML IJ SOLN
2.0000 ug/min | INTRAVENOUS | Status: DC | PRN
  Administered 2011-05-22: 7 ug/min via INTRAVENOUS
  Administered 2011-05-22 – 2011-05-23 (×2): 2 ug/min via INTRAVENOUS
  Filled 2011-05-22 (×2): qty 4

## 2011-05-22 MED ORDER — SODIUM CHLORIDE 0.9 % IV SOLN: INTRAVENOUS | Status: DC

## 2011-05-22 MED ORDER — PANTOPRAZOLE SODIUM 40 MG IV SOLR
40.0000 mg | Freq: Every day | INTRAVENOUS | Status: DC
  Administered 2011-05-22: 40 mg via INTRAVENOUS
  Filled 2011-05-22 (×2): qty 40

## 2011-05-22 MED ORDER — ONDANSETRON HCL 4 MG/2ML IJ SOLN
4.0000 mg | INTRAMUSCULAR | Status: AC | PRN
  Administered 2011-05-24 – 2011-05-27 (×2): 4 mg via INTRAVENOUS
  Filled 2011-05-22 (×2): qty 2

## 2011-05-22 MED ORDER — DEXTROSE 5 % IV SOLN
1.0000 g | Freq: Two times a day (BID) | INTRAVENOUS | Status: DC
  Administered 2011-05-22: 1 g via INTRAVENOUS
  Filled 2011-05-22 (×2): qty 1

## 2011-05-22 MED ORDER — VANCOMYCIN HCL IN DEXTROSE 1-5 GM/200ML-% IV SOLN
1000.0000 mg | Freq: Once | INTRAVENOUS | Status: DC
  Filled 2011-05-22: qty 200

## 2011-05-22 MED ORDER — VANCOMYCIN HCL IN DEXTROSE 1-5 GM/200ML-% IV SOLN
1000.0000 mg | Freq: Once | INTRAVENOUS | Status: AC
  Administered 2011-05-22: 1000 mg via INTRAVENOUS

## 2011-05-22 MED ORDER — FENTANYL CITRATE 0.05 MG/ML IJ SOLN: 100.0000 ug | Freq: Once | INTRAMUSCULAR | Status: DC

## 2011-05-22 MED ORDER — DEXTROSE 5 % IV SOLN
2.0000 g | Freq: Once | INTRAVENOUS | Status: DC
  Filled 2011-05-22: qty 2

## 2011-05-22 MED ORDER — HYDROMORPHONE HCL PF 1 MG/ML IJ SOLN
1.0000 mg | Freq: Once | INTRAMUSCULAR | Status: AC
  Administered 2011-05-22: 1 mg via INTRAVENOUS
  Filled 2011-05-22: qty 1

## 2011-05-22 MED ORDER — ONDANSETRON HCL 4 MG/2ML IJ SOLN
4.0000 mg | Freq: Once | INTRAMUSCULAR | Status: AC
  Administered 2011-05-22: 4 mg via INTRAVENOUS

## 2011-05-22 MED ORDER — ACETAMINOPHEN 650 MG RE SUPP
650.0000 mg | Freq: Four times a day (QID) | RECTAL | Status: DC | PRN
Start: 2011-05-22 — End: 2011-05-30
  Administered 2011-05-22: 650 mg via RECTAL
  Filled 2011-05-22: qty 1

## 2011-05-22 MED ORDER — TACROLIMUS 1 MG PO CAPS
2.0000 mg | ORAL_CAPSULE | Freq: Two times a day (BID) | ORAL | Status: DC
  Filled 2011-05-22: qty 3

## 2011-05-22 MED ORDER — HYDROCORTISONE SOD SUCCINATE 100 MG IJ SOLR
100.0000 mg | Freq: Once | INTRAMUSCULAR | Status: AC
  Administered 2011-05-22: 100 mg via INTRAVENOUS
  Filled 2011-05-22: qty 2

## 2011-05-22 MED ORDER — TACROLIMUS 1 MG PO CAPS
3.0000 mg | ORAL_CAPSULE | Freq: Every day | ORAL | Status: DC
  Administered 2011-05-23 – 2011-05-30 (×8): 3 mg via ORAL
  Filled 2011-05-22 (×8): qty 3

## 2011-05-22 MED ORDER — SODIUM CHLORIDE 0.9 % IV BOLUS (SEPSIS)
500.0000 mL | INTRAVENOUS | Status: DC | PRN
Start: 2011-05-22 — End: 2011-05-23

## 2011-05-22 MED ORDER — HYDROCORTISONE SOD SUCCINATE 100 MG IJ SOLR
50.0000 mg | Freq: Four times a day (QID) | INTRAMUSCULAR | Status: DC
  Administered 2011-05-22 – 2011-05-23 (×3): 50 mg via INTRAVENOUS
  Filled 2011-05-22 (×7): qty 1

## 2011-05-22 MED ORDER — VANCOMYCIN HCL 1000 MG IV SOLR
750.0000 mg | INTRAVENOUS | Status: DC
  Filled 2011-05-22: qty 750

## 2011-05-22 MED ORDER — MORPHINE SULFATE 2 MG/ML IJ SOLN
1.0000 mg | INTRAMUSCULAR | Status: DC | PRN
  Administered 2011-05-22 – 2011-05-23 (×3): 2 mg via INTRAVENOUS
  Filled 2011-05-22 (×4): qty 1

## 2011-05-22 MED ORDER — DOBUTAMINE IN D5W 4-5 MG/ML-% IV SOLN
2.5000 ug/kg/min | INTRAVENOUS | Status: DC | PRN
Start: 2011-05-22 — End: 2011-05-23
  Filled 2011-05-22: qty 250

## 2011-05-22 MED ORDER — DOBUTAMINE IN D5W 4-5 MG/ML-% IV SOLN
2.5000 ug/kg/min | INTRAVENOUS | Status: DC
  Administered 2011-05-22: 2.5 ug/kg/min via INTRAVENOUS
  Filled 2011-05-22 (×2): qty 250

## 2011-05-22 MED ORDER — ACETAMINOPHEN 325 MG PO TABS
650.0000 mg | ORAL_TABLET | Freq: Four times a day (QID) | ORAL | Status: DC | PRN
  Administered 2011-05-29: 650 mg via ORAL
  Filled 2011-05-22: qty 2

## 2011-05-22 MED ORDER — HYDROMORPHONE HCL PF 1 MG/ML IJ SOLN: 1.0000 mg | Freq: Once | INTRAMUSCULAR | Status: DC

## 2011-05-22 MED ORDER — LEVOFLOXACIN IN D5W 750 MG/150ML IV SOLN
750.0000 mg | Freq: Once | INTRAVENOUS | Status: AC
  Administered 2011-05-22: 750 mg via INTRAVENOUS
  Filled 2011-05-22: qty 150

## 2011-05-22 MED ORDER — AZATHIOPRINE 50 MG PO TABS
100.0000 mg | ORAL_TABLET | Freq: Every day | ORAL | Status: DC
  Administered 2011-05-23 – 2011-05-30 (×8): 100 mg via ORAL
  Filled 2011-05-22 (×8): qty 2

## 2011-05-22 MED ORDER — TACROLIMUS 1 MG PO CAPS
2.0000 mg | ORAL_CAPSULE | Freq: Every day | ORAL | Status: DC
  Administered 2011-05-23 – 2011-05-29 (×8): 2 mg via ORAL
  Filled 2011-05-22 (×10): qty 2

## 2011-05-22 NOTE — ED Notes (Signed)
Pt presents to department for evaluation of N/V, R sided flank pain and generalized weakness. Ongoing x1 day. Pt unable to sit still, moaning upon arrival to ED. States she has been unable to keep down food/fluids and medications. She is alert and oriented x4. No signs of acute distress at the time. Kidney transplant patient, 2005.

## 2011-05-22 NOTE — ED Provider Notes (Signed)
46yo F, c/o home fevers to "102," and N/V since yestereday.  Also c/o "pain" over her right kidney transplant site in right abd.  Unclear regarding recent sick contacts.  Kidney transplant in 2005 in [place].  Uncomfortable, low grade fever, CTA, RRR, +TTP right side of abd over transplant site.  IVF, zofran, labs ordered; move to back.  I saw this patient for screening purposes.  Pt is stable, but further workup and evaluation is needed beyond triage capabilities.   Alfonzo Feller, DO 05/22/11 1144

## 2011-05-22 NOTE — ED Notes (Signed)
Dr Eino Farber in room and brandy with pccm with patient regarding central line placement

## 2011-05-22 NOTE — Consult Note (Signed)
Reason for Consult:AKI/CKD Referring Physician: Villa Herb, MD  Tina Watkins is an 46 y.o. female.  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  Normal BP's are 90's/50's and is now hypotensive at 74/42 and will be admitted by PCCM to an ICU for SIRS.  We were asked see the pt to help further eval/manage her AKI as well as her immunosuppressive medications.  She was unable to hold down her medications for the last 2 days, including reglan and immunosuppressive agents  Trend in Creatinine: Creatinine, Ser  Date/Time Value Range Status  05/22/2011 12:38 PM 2.89* 0.50-1.10 (mg/dL) Final  05/02/2009  9:17 PM 1.8* 0.4-1.2 (mg/dL) Final  08/25/2008  4:20 AM 1.85* 0.4-1.2 (mg/dL) Final  08/24/2008  5:50 AM 1.91* 0.4-1.2 (mg/dL) Final  08/23/2008  6:04 AM 1.68* 0.4-1.2 (mg/dL) Final  08/22/2008  6:37 PM 2.1* 0.4-1.2 (mg/dL) Final  08/22/2008  6:25 PM 1.80* 0.4-1.2 (mg/dL) Final  07/17/2008  7:26 PM 2.0* 0.4-1.2 (mg/dL) Final  05/05/2008  5:33 PM 1.37* 0.4-1.2 (mg/dL) Final  04/09/2008  4:55 AM 1.99* 0.4-1.2 (mg/dL) Final  04/08/2008  5:35 AM 1.82* 0.4-1.2 (mg/dL) Final  04/07/2008  5:27 AM 1.65* 0.4-1.2 (mg/dL) Final  03/21/2008 11:47 AM 1.9* 0.4-1.2 (mg/dL) Final  03/12/2008  4:05 PM 2.15* 0.4-1.2 (mg/dL) Final  01/07/2008  8:51 PM 2.0* 0.4-1.2 (mg/dL) Final  08/24/2007  9:50 AM 1.5* 0.4-1.2 (mg/dL) Final    PMH:   Past Medical History  Diagnosis Date  . Gastroparesis   . Hx of kidney transplant   . Gout   . Hypotension     PSH:   Past Surgical History  Procedure Date  . Kidney transplant   . Appendectomy   . Eye surgery     Allergies: No Known Allergies  Medications:     Prior to Admission medications   Medication Sig Start Date End Date Taking? Authorizing Provider  aspirin EC 81 MG tablet Take 81 mg by mouth daily.   Yes Historical Provider, MD  azaTHIOprine (IMURAN) 50 MG tablet Take 100 mg by mouth daily.   Yes Historical Provider, MD  calcitRIOL (ROCALTROL) 0.25 MCG capsule Take 0.25 mcg by mouth daily.   Yes Historical Provider, MD  calcium carbonate (TUMS EX) 750 MG chewable tablet Chew 1-2 tablets by mouth as needed. For stomach   Yes Historical Provider, MD  epoetin alfa (EPOGEN,PROCRIT) 57846 UNIT/ML injection Inject 40,000 Units into the skin every 30 (thirty) days.    Yes Historical Provider, MD  furosemide (LASIX) 40 MG tablet Take 40 mg by mouth at bedtime as needed. For fluid   Yes Historical Provider, MD  metoCLOPramide (REGLAN) 5 MG tablet Take 5 mg by mouth at bedtime. 02/23/11 02/23/12 Yes Milus Banister, MD  Multiple Vitamin (MULITIVITAMIN WITH MINERALS) TABS Take 1 tablet by mouth daily.   Yes Historical Provider, MD  omeprazole (PRILOSEC) 40 MG capsule Take 40 mg by mouth 2 (two) times daily.    Yes Historical Provider, MD  oxyCODONE-acetaminophen (PERCOCET) 10-650 MG per tablet Take 1 tablet by mouth every 6 (six) hours as needed. For pain.   Yes Historical Provider, MD  predniSONE (DELTASONE) 5 MG tablet Take 5 mg  by mouth daily.   Yes Historical Provider, MD  promethazine (PHENERGAN) 25 MG tablet Take 25 mg by mouth every 6 (six) hours as needed. For nausea    Yes Historical Provider, MD  rosuvastatin (CRESTOR) 5 MG tablet Take 5 mg by mouth daily.   Yes Historical Provider, MD  tacrolimus (PROGRAF) 1 MG capsule Take 2-3 mg by mouth 2 (two) times daily. 3mg  in the morning 2mg  at night   Yes Historical Provider, MD  zolpidem (AMBIEN) 10 MG tablet Take 10 mg by mouth at bedtime as needed. To help sleep.   Yes Historical Provider, MD    Discontinued Meds:   Medications Discontinued During This Encounter  Medication Reason  . aspirin 81  MG tablet Dose change  . Multiple Vitamin (MULTIVITAMIN) capsule Inpatient Standard  . dextromethorphan-guaiFENesin (MUCINEX DM) 30-600 MG per 12 hr tablet Patient has not taken in last 30 days  . cetirizine (ZYRTEC) 10 MG tablet Patient has not taken in last 30 days  . diphenhydrAMINE (BENADRYL) 25 MG tablet Patient has not taken in last 30 days  . ondansetron (ZOFRAN) 4 MG tablet Patient has not taken in last 30 days  . metoCLOPramide (REGLAN) 5 MG tablet   . Eszopiclone (ESZOPICLONE) 3 MG TABS Patient has not taken in last 30 days  . HYDROmorphone (DILAUDID) injection 1 mg     Social History:  reports that she has been smoking Cigarettes.  She has been smoking about .5 packs per day. She has never used smokeless tobacco. She reports that she drinks alcohol. She reports that she uses illicit drugs (Marijuana).  Family History:   Family History  Problem Relation Age of Onset  . Hypertension Maternal Grandmother     A comprehensive review of systems was negative except for: Constitutional: positive for fatigue, fevers and malaise Gastrointestinal: positive for nausea and vomiting Musculoskeletal: positive for right-sided flank pain radiating around to her abdomen/pelvic area near her transplanted kidney Weight change:  No intake or output data in the 24 hours ending 05/22/11 1554  General appearance: fatigued and mild distress Head: Normocephalic, without obvious abnormality, atraumatic, syndromic facies Eyes: positive findings: degenerative changes to OD Neck: no adenopathy, no carotid bruit, supple, symmetrical, trachea midline and area of edema and ecchymosis at right IJ puncture site Resp: clear to auscultation bilaterally Cardio: tachycardic, no rub GI: normal findings: bowel sounds normal and abnormal findings:  tenderness upon palpation of allograft in RLQ, no rebound Extremities: extremities normal, atraumatic, no cyanosis or edema Neurologic: Grossly normal  Labs: Basic  Metabolic Panel:  Lab [id number] 1238  NA 133*  K 4.5  CL 98  CO2 18*  GLUCOSE 89  BUN 38*  CREATININE 2.89*  ALB --  CALCIUM 9.9  PHOS --   Liver Function Tests:  Lab 05/22/11 1238  AST 30  ALT 11  ALKPHOS 75  BILITOT 1.3*  PROT 6.7  ALBUMIN 3.5    Lab 05/22/11 1238  LIPASE 20  AMYLASE --   No results found for this basename: AMMONIA:3 in the last 168 hours CBC:  Lab 05/22/11 1238  WBC 10.0  NEUTROABS 9.2*  HGB 11.0*  HCT 31.8*  MCV 91.9  PLT PLATELET CLUMPS NOTED ON SMEAR, UNABLE TO ESTIMATE   PT/INR: @labrcntip (inr:5) Cardiac Enzymes: No results found for this basename: CKTOTAL:5,CKMB:5,CKMBINDEX:5,TROPONINI:5 in the last 168 hours CBG: No results found for this basename: GLUCAP:5 in the last 168 hours  Iron Studies: No results found for this basename: IRON:30,TIBC:30,TRANSFERRIN:30,FERRITIN:30 in the last 168  hours  Xrays/Other Studies: Dg Chest 2 View  05/22/2011  *RADIOLOGY REPORT*  Clinical Data: 46 year old female with nausea vomiting fever, back pain, cough, tachycardia, dialysis.  CHEST - 2 VIEW  Comparison: 02/19/2011 and earlier.  Findings: Stable mediastinal metallic stent, may be within the SVC. Stable cardiac size and mediastinal contours.  Stable lung volumes. No pneumothorax or pneumoperitoneum.  No pulmonary edema, pleural effusion or acute pulmonary opacity.  Stable epigastric surgical clips.  Paucity of bowel gas. No acute osseous abnormality identified.  IMPRESSION: No acute cardiopulmonary abnormality.  Original Report Authenticated By: Randall An, M.D.   Dg Chest Portable 1 View  05/22/2011  *RADIOLOGY REPORT*  Clinical Data: Status post failed central line attempt.  PORTABLE CHEST - 1 VIEW  Comparison: Two-view chest 05/22/2011.  Findings: An SVC stent is in place.  Heart size is normal.  The lungs are clear.  The there is no pneumothorax.  IMPRESSION:  1.  No acute cardiopulmonary disease or significant interval change. 2.  No  pneumothorax.  Original Report Authenticated By: Resa Miner. MATTERN, M.D.     Assessment/Plan: 1.  AKI/CKD- likely due to ischemic ATN in setting of volume depletion/hypotension and sepsis.  Agree with IVF's and follow UOP and renal function. 2. S/p LRD kidney transplant with allograft nephropathy.  Continue with prograf but would lower dose to 2mg  bid and cont with immuran 100mg  qd. 3. SIRS- agree with admission to ICU and broad spectrum abx with pressors 4. Flank pain- will need [name] of transplanted kidney +/- CT scan, pan culture 5. Anemia of chronic disease- cont with epo. Last Hgb 10.3 and likely higher due to volume depletion 6. Tobacco abuse- stress smoking cessation   Atari Novick A 05/22/2011, 3:54 PM

## 2011-05-22 NOTE — ED Notes (Signed)
Error in SPO2 of 65 %, poor wave form. Pt oxygen is 95 % on 2 litrs.

## 2011-05-22 NOTE — ED Provider Notes (Signed)
Medical screening examination/treatment/procedure(s) were performed by non-physician practitioner and as supervising physician I was immediately available for consultation/collaboration.  Jasper Riling. Alvino Chapel, MD 05/22/11 1546

## 2011-05-22 NOTE — Procedures (Signed)
Central Venous Catheter Insertion Procedure Note Tina Watkins [id number] 1965/08/29  Procedure: Insertion of Central Venous Catheter Indications: Assessment of intravascular volume, administration of antibiotics, IV fluids  Procedure Details Consent: Risks of procedure as well as the alternatives and risks of each were explained to the (patient/caregiver).  Consent for procedure obtained. Time Out: Verified patient identification, verified procedure, site/side was marked, verified correct patient position, special equipment/implants available, medications/allergies/relevent history reviewed, required imaging and test results available.  Performed  Maximum sterile technique was used including antiseptics, cap, gloves, gown, hand hygiene, mask and sheet. Skin prep: Chlorhexidine; local anesthetic administered A antimicrobial bonded/coated triple lumen catheter was placed in the left femoral vein due to multiple attempts, no other available access and patient being a dialysis patient using the Seldinger technique.  Evaluation Blood flow good Complications: No apparent complications Patient did tolerate procedure well.  Procedure performed under direct supervision of Dr. Alva Garnet and with ultrasound guidance.    Unable to place catheter beyond 10cm (met resistance).  Sutured at 10cm with good blood return via 3 ports and easily flushed.  No other access sites available.     Procedure performed by ACNP Alfredo Martinez under my direct supervision    Merton Border, MD;  PCCM service; Mobile [phone]

## 2011-05-22 NOTE — ED Notes (Signed)
Pt gives verbal consent to RN and NP with PCCM for central line placement.

## 2011-05-22 NOTE — ED Notes (Signed)
Central line placed to left femoral for med admin, pt tolerated well. Dr simonds at bedside with brandy np for pccm.

## 2011-05-22 NOTE — Progress Notes (Signed)
ANTIBIOTIC CONSULT NOTE - INITIAL  Pharmacy Consult for Vancomycin, Cefepime, Levaquin Indication: rule out sepsis  No Known Allergies  Patient Measurements: Height: 4' 11.06" (150 cm) Weight: 132 lb 4.4 oz (60 kg) IBW/kg (Calculated) : 43.33  Adjusted Body Weight:   Vital Signs: Temp: 103 F (39.4 C) (04/19 1311) Temp src: Oral (04/19 1311) BP: 84/49 mmHg (04/19 1645) Pulse Rate: 68  (04/19 1645) Intake/Output from previous day:   Intake/Output from this shift:    Labs:  Basename 05/22/11 1238  WBC 10.0  HGB 11.0*  PLT PLATELET CLUMPS NOTED ON SMEAR, UNABLE TO ESTIMATE  LABCREA --  CREATININE 2.89*   Estimated Creatinine Clearance: 19.8 ml/min (by C-G formula based on Cr of 2.89). No results found for this basename: VANCOTROUGH:2,VANCOPEAK:2,VANCORANDOM:2,GENTTROUGH:2,GENTPEAK:2,GENTRANDOM:2,TOBRATROUGH:2,TOBRAPEAK:2,TOBRARND:2,AMIKACINPEAK:2,AMIKACINTROU:2,AMIKACIN:2, in the last 72 hours   Microbiology: No results found for this or any previous visit (from the past 720 hour(s)).  Medical History: Past Medical History  Diagnosis Date  . Gastroparesis   . Hx of kidney transplant   . Gout   . Hypotension     Medications:  Scheduled:    . ceFEPime (MAXIPIME) IV  1 g Intravenous Q24H  . fentaNYL  100 mcg Intravenous Once  . heparin  5,000 Units Subcutaneous Q8H  . hydrocortisone sodium succinate  100 mg Intravenous Once  .  HYDROmorphone (DILAUDID) injection  1 mg Intravenous Once  . levofloxacin (LEVAQUIN) IV  250 mg Intravenous Q24H  . levofloxacin (LEVAQUIN) IV  750 mg Intravenous Once  . ondansetron (ZOFRAN) IV  4 mg Intravenous Once  . pantoprazole (PROTONIX) IV  40 mg Intravenous QHS  . promethazine  12.5 mg Intravenous Once  . sodium chloride  1,000 mL Intravenous Once  . sodium chloride  1,000 mL Intravenous Once  . sodium chloride  1,000 mL Intravenous Once  . sodium chloride  25 mL/kg Intravenous Once  . sodium chloride  500 mL Intravenous Once   . vancomycin  750 mg Intravenous Q24H  . vancomycin  1,000 mg Intravenous Once  . DISCONTD: ceFEPime (MAXIPIME) IV  1 g Intravenous Q12H  . DISCONTD: ceFEPime (MAXIPIME) IV  2 g Intravenous Once  . DISCONTD:  HYDROmorphone (DILAUDID) injection  1 mg Intravenous Once  . DISCONTD: vancomycin  1,000 mg Intravenous Once   Assessment: 46 yr old female with history of kidney transplant admitted with possible sepsis, flank pain. Received Vancomycin 1 Gm, Cefepime 1 Gm and Levaquin 750 mg in ED  Goal of Therapy:  Vancomycin trough level 15-20 mcg/ml  Plan:  Will continue cefepime 1 Gm q24hr, Vancomycin 750 mg q24hr, and Levaquin 250 mg q24hr. Will follow renal function and adjust antibiotics if needed. Vancomycin trough when appropriate.  Minta Balsam 05/22/2011,5:55 PM

## 2011-05-22 NOTE — ED Provider Notes (Signed)
History     CSN: [id number]  Arrival date & time 05/22/11  1045   First MD Initiated Contact with Patient 05/22/11 1124      Chief Complaint  Patient presents with  . Nausea  . Emesis    (Consider location/radiation/quality/duration/timing/severity/associated sxs/prior treatment) Patient is a 46 y.o. female presenting with vomiting. The history is provided by the patient.  Emesis  This is a new problem. The current episode started yesterday. The problem occurs continuously. The problem has been gradually worsening. The emesis has an appearance of stomach contents. The maximum temperature recorded prior to her arrival was 102 to 102.9 F. The fever has been present for 1 to 2 days. Associated symptoms include abdominal pain, chills and a fever. Pertinent negatives include no diarrhea.  Pt states she is a kidney transplant pt, with transplant done in 2005 in [place]. States here she is followed by dr. Estevan Ryder. States she is doing well until yesterday, when she began having pain in that right flank, nausea, vomiting, fever up to 102.5 at home. Pt states she is unable to keep any of her medications down, including her transplant meds. Pt stats pain is worsening. Denies chest pain, denies injuries, denies abdominal pain.   Past Medical History  Diagnosis Date  . Gastroparesis   . Hx of kidney transplant   . Gout   . Hypotension     Past Surgical History  Procedure Date  . Kidney transplant   . Appendectomy   . Eye surgery     Family History  Problem Relation Age of Onset  . Hypertension Maternal Grandmother     History  Substance Use Topics  . Smoking status: Current Everyday Smoker -- 0.5 packs/day    Types: Cigarettes  . Smokeless tobacco: Never Used  . Alcohol Use: Yes     rare    OB History    Grav Para Term Preterm Abortions TAB SAB Ect Mult Living                  Review of Systems  Constitutional: Positive for fever and chills.  HENT: Negative.   Eyes:  Negative.   Respiratory: Negative.   Cardiovascular: Negative.   Gastrointestinal: Positive for nausea, vomiting and abdominal pain. Negative for diarrhea.  Genitourinary: Positive for flank pain. Negative for dysuria, frequency, hematuria, vaginal bleeding and vaginal discharge.  Musculoskeletal: Positive for back pain.  Skin: Negative.   Neurological: Positive for dizziness and weakness.  Psychiatric/Behavioral: Negative.     Allergies  Review of patient's allergies indicates no known allergies.  Home Medications   Current Outpatient Rx  Name Route Sig Dispense Refill  . ASPIRIN EC 81 MG PO TBEC Oral Take 81 mg by mouth daily.    . AZATHIOPRINE 50 MG PO TABS Oral Take 100 mg by mouth daily.    [name] CALCITRIOL 0.25 MCG PO CAPS Oral Take 0.25 mcg by mouth daily.    [name] CALCIUM CARBONATE ANTACID 750 MG PO CHEW Oral Chew 1-2 tablets by mouth as needed. For stomach    . EPOETIN ALFA 53664 UNIT/ML IJ SOLN Subcutaneous Inject 40,000 Units into the skin every 30 (thirty) days.     . FUROSEMIDE 40 MG PO TABS Oral Take 40 mg by mouth at bedtime as needed. For fluid    . METOCLOPRAMIDE HCL 5 MG PO TABS Oral Take 5 mg by mouth at bedtime.    . ADULT MULTIVITAMIN W/MINERALS CH Oral Take 1 tablet by mouth daily.    [name]  OMEPRAZOLE 40 MG PO CPDR Oral Take 40 mg by mouth 2 (two) times daily.     . OXYCODONE-ACETAMINOPHEN 10-650 MG PO TABS Oral Take 1 tablet by mouth every 6 (six) hours as needed. For pain.    [name] PREDNISONE 5 MG PO TABS Oral Take 5 mg by mouth daily.    [name] PROMETHAZINE HCL 25 MG PO TABS Oral Take 25 mg by mouth every 6 (six) hours as needed. For nausea     . ROSUVASTATIN CALCIUM 5 MG PO TABS Oral Take 5 mg by mouth daily.    [name] TACROLIMUS 1 MG PO CAPS Oral Take 2-3 mg by mouth 2 (two) times daily. 3mg  in the morning 2mg  at night    . ZOLPIDEM TARTRATE 10 MG PO TABS Oral Take 10 mg by mouth at bedtime as needed. To help sleep.      BP 111/89  Pulse 67  Temp(Src) 100.3 F (37.9 C) (Oral)   Resp 18  SpO2 95%  Physical Exam  Nursing note and vitals reviewed. Constitutional: She is oriented to person, place, and time. She appears well-developed and well-nourished.       Appears in pain, crying  HENT:  Head: Normocephalic.  Eyes: Conjunctivae are normal.  Neck: Neck supple.  Cardiovascular: Normal rate, regular rhythm and normal heart sounds.   Pulmonary/Chest: Effort normal and breath sounds normal. No respiratory distress.  Abdominal: Soft. Bowel sounds are normal. She exhibits no distension. There is tenderness.       Right upper abdominal tenderness with palpation. There is old healed surgical scar to the right abdomen and right flank. Right CVA tenderness.  Musculoskeletal: Normal range of motion. She exhibits no edema and no tenderness.  Neurological: She is alert and oriented to person, place, and time.  Skin: Skin is warm and dry.  Psychiatric: She has a normal mood and affect.       Appears very anxious    ED Course  Procedures (including critical care time)  Pt febrile at 100Orally, appears to be in severe pain. Concerning  For kidney infection. Will get labs, cultures, pain meds ordered, tylenol ordered rectally. VS currently normal.   1:25 PM Pt's BP dropped to 80/50s, temp rechecked 103. HR remains normal. Lactic acid procalcitonin normal. Concerning for possible sepsis. Paged to internist. Will come see pt.  Ordered vancomycin and cefipime.  Results for orders placed during the hospital encounter of 05/22/11  CBC      Component Value Range   WBC 10.0  4.0 - 10.5 (K/uL)   RBC 3.46 (*) 3.87 - 5.11 (MIL/uL)   Hemoglobin 11.0 (*) 12.0 - 15.0 (g/dL)   HCT 31.8 (*) 36.0 - 46.0 (%)   MCV 91.9  78.0 - 100.0 (fL)   MCH 31.8  26.0 - 34.0 (pg)   MCHC 34.6  30.0 - 36.0 (g/dL)   RDW 20.1 (*) 11.5 - 15.5 (%)   Platelets PLATELET CLUMPS NOTED ON SMEAR, UNABLE TO ESTIMATE  150 - 400 (K/uL)  DIFFERENTIAL      Component Value Range   Neutrophils Relative 92 (*) 43  - 77 (%)   Lymphocytes Relative 3 (*) 12 - 46 (%)   Monocytes Relative 5  3 - 12 (%)   Eosinophils Relative 0  0 - 5 (%)   Basophils Relative 0  0 - 1 (%)   Neutro Abs 9.2 (*) 1.7 - 7.7 (K/uL)   Lymphs Abs 0.3 (*) 0.7 - 4.0 (K/uL)   Monocytes Absolute  0.5  0.1 - 1.0 (K/uL)   Eosinophils Absolute 0.0  0.0 - 0.7 (K/uL)   Basophils Absolute 0.0  0.0 - 0.1 (K/uL)   RBC Morphology SPHEROCYTES     WBC Morphology DOHLE BODIES    COMPREHENSIVE METABOLIC PANEL      Component Value Range   Sodium 133 (*) 135 - 145 (mEq/L)   Potassium 4.5  3.5 - 5.1 (mEq/L)   Chloride 98  96 - 112 (mEq/L)   CO2 18 (*) 19 - 32 (mEq/L)   Glucose, Bld 89  70 - 99 (mg/dL)   BUN 38 (*) 6 - 23 (mg/dL)   Creatinine, Ser 2.89 (*) 0.50 - 1.10 (mg/dL)   Calcium 9.9  8.4 - 10.5 (mg/dL)   Total Protein 6.7  6.0 - 8.3 (g/dL)   Albumin 3.5  3.5 - 5.2 (g/dL)   AST 30  0 - 37 (U/L)   ALT 11  0 - 35 (U/L)   Alkaline Phosphatase 75  39 - 117 (U/L)   Total Bilirubin 1.3 (*) 0.3 - 1.2 (mg/dL)   GFR calc non Af Amer 19 (*) >90 (mL/min)   GFR calc Af Amer 22 (*) >90 (mL/min)  LIPASE, BLOOD      Component Value Range   Lipase 20  11 - 59 (U/L)  URINALYSIS, ROUTINE W REFLEX MICROSCOPIC      Component Value Range   Color, Urine AMBER (*) YELLOW    APPearance CLOUDY (*) CLEAR    Specific Gravity, Urine 1.014  1.005 - 1.030    pH 6.5  5.0 - 8.0    Glucose, UA NEGATIVE  NEGATIVE (mg/dL)   Hgb urine dipstick LARGE (*) NEGATIVE    Bilirubin Urine SMALL (*) NEGATIVE    Ketones, ur 15 (*) NEGATIVE (mg/dL)   Protein, ur >300 (*) NEGATIVE (mg/dL)   Urobilinogen, UA 1.0  0.0 - 1.0 (mg/dL)   Nitrite NEGATIVE  NEGATIVE    Leukocytes, UA LARGE (*) NEGATIVE   LACTIC ACID, PLASMA      Component Value Range   Lactic Acid, Venous 2.8 (*) 0.5 - 2.2 (mmol/L)  PROCALCITONIN      Component Value Range   Procalcitonin 46.29    URINE MICROSCOPIC-ADD ON      Component Value Range   Squamous Epithelial / LPF MANY (*) RARE    WBC, UA TOO  NUMEROUS TO COUNT  <3 (WBC/hpf)   RBC / HPF 21-50  <3 (RBC/hpf)   Bacteria, UA FEW (*) RARE    Dg Chest 2 View  05/22/2011  *RADIOLOGY REPORT*  Clinical Data: 46 year old female with nausea vomiting fever, back pain, cough, tachycardia, dialysis.  CHEST - 2 VIEW  Comparison: 02/19/2011 and earlier.  Findings: Stable mediastinal metallic stent, may be within the SVC. Stable cardiac size and mediastinal contours.  Stable lung volumes. No pneumothorax or pneumoperitoneum.  No pulmonary edema, pleural effusion or acute pulmonary opacity.  Stable epigastric surgical clips.  Paucity of bowel gas. No acute osseous abnormality identified.  IMPRESSION: No acute cardiopulmonary abnormality.  Original Report Authenticated By: Randall An, M.D.   Urine contaminated, however suggestive of possible infection. Cultures sent. Antibiotics started. Critical care here to see pt. Will admit.    1. Flank pain   2. Sepsis       MDM          Renold Genta, PA 05/22/11 1534

## 2011-05-22 NOTE — H&P (Signed)
PCCM ADMISSION NOTE  Date of admission:  Pt Profile: S/P renal transplant in 2005 presented 4/19 with fever, hypotension (acute on chronic) R flank pain and N/V. Admitted with dx of presumed severe sepsis possibly due to native kidney pyelonephritis.  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 Normal BP's are 90's/50's and is now hypotensive at 74/42 and will be admitted by PCCM to an ICU for SIRS. We were asked see the pt to help further eval/manage her AKI as well as her immunosuppressive medications. She was unable to hold down her medications for the last 2 days, including reglan and immunosuppressive agents    Past Medical History  Diagnosis Date  . Gastroparesis   . Hx of kidney transplant   . Gout   . Hypotension    Past Surgical History  Procedure Date  . Kidney transplant   . Appendectomy   . Eye surgery     MEDICATIONS: reviewed. Include chronic steroids  History   Social History  . Marital Status: Single    Spouse Name: N/A    Number of Children: 0  . Years of Education: N/A   Occupational History  . disabiled    Social History Main Topics  . Smoking status: Current Everyday Smoker -- 0.5 packs/day    Types: Cigarettes  . Smokeless tobacco: Never Used  . Alcohol Use: Yes     rare  . Drug Use: Yes    Special: Marijuana  . Sexually Active: Not on file   Other Topics Concern  . Not on file   Social History  Narrative  . No narrative on file    Family History  Problem Relation Age of Onset  . Hypertension Maternal Grandmother     ROS - Cough with scant sputum. No hemoptysis, purulent sputum, pleurodynia, LE calf tenderness or edema  Filed Vitals:   05/22/11 1630 05/22/11 1645 05/22/11 1715 05/22/11 1730  BP: 81/51 84/49 70/37  77/37  Pulse: 70 68 70 74  Temp:      TempSrc:      Resp: 16 12 22 17   Height:      Weight:      SpO2: 100% 100% 96%     EXAM:  Gen: Pleasant, cushingoid facies, WDWN, NAD HEENT: NAD Neck: No JVD Lungs: Clear to auscultation and percussion Cardiovascular: RRR s M Abdomen: Soft, NT, NABS Musculoskeletal: No C/C/E Neuro: Nonfocal Skin: No lesions  DATA: BMET    Component Value Date/Time   NA 133* 05/22/2011 1238   K 4.5 05/22/2011 1238   CL 98 05/22/2011 1238   CO2 18* 05/22/2011 1238   GLUCOSE 89 05/22/2011 1238   BUN 38* 05/22/2011 1238   CREATININE 2.89* 05/22/2011 1238   CALCIUM 9.9 05/22/2011 1238   GFRNONAA 19* 05/22/2011 1238   GFRAA 22* 05/22/2011 1238    CBC    Component Value Date/Time  WBC 10.0 05/22/2011 1238   RBC 3.46* 05/22/2011 1238   HGB 11.0* 05/22/2011 1238   HCT 31.8* 05/22/2011 1238   PLT PLATELET CLUMPS NOTED ON SMEAR, UNABLE TO ESTIMATE 05/22/2011 1238   MCV 91.9 05/22/2011 1238   MCH 31.8 05/22/2011 1238   MCHC 34.6 05/22/2011 1238   RDW 20.1* 05/22/2011 1238   LYMPHSABS 0.3* 05/22/2011 1238   MONOABS 0.5 05/22/2011 1238   EOSABS 0.0 05/22/2011 1238   BASOSABS 0.0 05/22/2011 1238    CXR: NACPD  IMPRESSION/PLAN:  Hypotension, presumed septic shock - presentation of N/V, fever, flank pain. Concerned for native kidney pyelonephritis - Admit to ICU -Cover broadly with abx as per dashboard -IVFs given -Pressors to maintain MAP > 60 mmHg -cultures drawn - follow up results -Stress dose steroids  Acute on chronic renal insufficiency after renal transplant - resuscitate as above - Renal consult requested - Cont anti-rejection  medications  Gastroparesis with acute N/V - PRN Zofran    40 mins CCM time  Merton Border, MD;  PCCM service; Mobile [phone]

## 2011-05-22 NOTE — ED Notes (Signed)
Pt declines any pain or other meds   at this time

## 2011-05-22 NOTE — Progress Notes (Signed)
[facility] Progress Note Patient Name: OCEOLA CHAKRABARTI DOB: 05/19/1965 MRN: [id number]  Date of Service  05/22/2011   HPI/Events of Note   Pt with rash after levaquin.  eICU Interventions   D/c the levaquin and continue to cover with cefepime for pyelo.    Intervention Category Major Interventions: Infection - evaluation and management  Taison Celani S. 05/22/2011, 7:40 PM

## 2011-05-23 ENCOUNTER — Inpatient Hospital Stay (HOSPITAL_COMMUNITY): Payer: Medicaid Other

## 2011-05-23 DIAGNOSIS — Z940 Kidney transplant status: Secondary | ICD-10-CM

## 2011-05-23 DIAGNOSIS — R7881 Bacteremia: Secondary | ICD-10-CM

## 2011-05-23 DIAGNOSIS — B9689 Other specified bacterial agents as the cause of diseases classified elsewhere: Secondary | ICD-10-CM

## 2011-05-23 DIAGNOSIS — N179 Acute kidney failure, unspecified: Secondary | ICD-10-CM

## 2011-05-23 HISTORY — DX: Bacteremia: R78.81

## 2011-05-23 LAB — CARDIAC PANEL(CRET KIN+CKTOT+MB+TROPI)
CK, MB: 2 ng/mL (ref 0.3–4.0)
CK, MB: 1.9 ng/mL (ref 0.3–4.0)
Relative Index: INVALID (ref 0.0–2.5)
Relative Index: INVALID (ref 0.0–2.5)
Total CK: 87 U/L (ref 7–177)
Total CK: 58 U/L (ref 7–177)
Troponin I: <0.3 ng/mL (ref <0.30)
Troponin I: <0.3 ng/mL (ref <0.30)

## 2011-05-23 LAB — CBC
HCT: 27.2 % — ABNORMAL LOW (ref 36.0–46.0)
Hemoglobin: 9.3 g/dL — ABNORMAL LOW (ref 12.0–15.0)
MCH: 31.8 pg (ref 26.0–34.0)
MCHC: 34.2 g/dL (ref 30.0–36.0)
MCV: 93.2 fL (ref 78.0–100.0)
Platelets: 126 10*3/uL — ABNORMAL LOW (ref 150–400)
RBC: 2.92 MIL/uL — ABNORMAL LOW (ref 3.87–5.11)
RDW: 20.3 % — ABNORMAL HIGH (ref 11.5–15.5)
WBC: 12.9 10*3/uL — ABNORMAL HIGH (ref 4.0–10.5)

## 2011-05-23 LAB — BLOOD GAS, ARTERIAL
Acid-base deficit: 6.2 mmol/L — ABNORMAL HIGH (ref 0.0–2.0)
Bicarbonate: 17.5 mEq/L — ABNORMAL LOW (ref 20.0–24.0)
Drawn by: 100061
O2 Saturation: 87.9 %
Patient temperature: 98.6
TCO2: 18.3 mmol/L (ref 0–100)
pCO2 arterial: 27.9 mmHg — ABNORMAL LOW (ref 35.0–45.0)
pH, Arterial: 7.414 — ABNORMAL HIGH (ref 7.350–7.400)
pO2, Arterial: 50.4 mmHg — ABNORMAL LOW (ref 80.0–100.0)

## 2011-05-23 LAB — BASIC METABOLIC PANEL
BUN: 40 mg/dL — ABNORMAL HIGH (ref 6–23)
CO2: 19 mEq/L (ref 19–32)
Calcium: 8.8 mg/dL (ref 8.4–10.5)
Chloride: 105 mEq/L (ref 96–112)
Creatinine, Ser: 3.1 mg/dL — ABNORMAL HIGH (ref 0.50–1.10)
GFR calc Af Amer: 20 mL/min — ABNORMAL LOW (ref >90)
GFR calc non Af Amer: 17 mL/min — ABNORMAL LOW (ref >90)
Glucose, Bld: 144 mg/dL — ABNORMAL HIGH (ref 70–99)
Potassium: 4.2 mEq/L (ref 3.5–5.1)
Sodium: 135 mEq/L (ref 135–145)

## 2011-05-23 LAB — URINALYSIS, ROUTINE W REFLEX MICROSCOPIC
Bilirubin Urine: NEGATIVE
Glucose, UA: NEGATIVE mg/dL
Ketones, ur: NEGATIVE mg/dL
Nitrite: NEGATIVE
Protein, ur: 100 mg/dL — AB
Specific Gravity, Urine: 1.015 (ref 1.005–1.030)
Urobilinogen, UA: 0.2 mg/dL (ref 0.0–1.0)
pH: 5.5 (ref 5.0–8.0)

## 2011-05-23 LAB — URINE MICROSCOPIC-ADD ON

## 2011-05-23 LAB — CORTISOL: Cortisol, Plasma: 112.7 ug/dL

## 2011-05-23 MED ORDER — DARBEPOETIN ALFA-POLYSORBATE 100 MCG/0.5ML IJ SOLN
100.0000 ug | INTRAMUSCULAR | Status: DC
  Administered 2011-05-23: 100 ug via SUBCUTANEOUS
  Filled 2011-05-23: qty 0.5

## 2011-05-23 MED ORDER — VANCOMYCIN HCL 1000 MG IV SOLR
750.0000 mg | INTRAVENOUS | Status: DC
  Filled 2011-05-23: qty 750

## 2011-05-23 MED ORDER — HYDROMORPHONE HCL PF 1 MG/ML IJ SOLN
1.0000 mg | INTRAMUSCULAR | Status: DC | PRN
  Administered 2011-05-23 – 2011-05-30 (×19): 1 mg via INTRAVENOUS
  Filled 2011-05-23 (×21): qty 1

## 2011-05-23 MED ORDER — HYDROCORTISONE SOD SUCCINATE 100 MG IJ SOLR
50.0000 mg | Freq: Three times a day (TID) | INTRAMUSCULAR | Status: DC
  Administered 2011-05-23: 50 mg via INTRAVENOUS
  Administered 2011-05-23: 100 mg via INTRAVENOUS
  Administered 2011-05-24 – 2011-05-25 (×4): 50 mg via INTRAVENOUS
  Filled 2011-05-23 (×9): qty 1

## 2011-05-23 MED ORDER — METOCLOPRAMIDE HCL 5 MG/ML IJ SOLN
5.0000 mg | Freq: Three times a day (TID) | INTRAMUSCULAR | Status: DC
  Administered 2011-05-24 (×3): 5 mg via INTRAVENOUS
  Administered 2011-05-25 (×2): via INTRAVENOUS
  Administered 2011-05-25 – 2011-05-26 (×2): 5 mg via INTRAVENOUS
  Administered 2011-05-26 (×2): via INTRAVENOUS
  Administered 2011-05-27 – 2011-05-29 (×9): 5 mg via INTRAVENOUS
  Filled 2011-05-23 (×23): qty 1

## 2011-05-23 NOTE — Progress Notes (Signed)
PCCM PROGRESS NOTE  Date of admission: 4/19 Pt Profile: S/P renal transplant in 2005 presented 4/19 with fever, hypotension (acute on chronic) R flank pain and N/V. Admitted with dx of presumed severe sepsis possibly due to native kidney pyelonephritis.  LINES/TUBES: L femoral CVL 4/19 >>   MICRO:  Blood x 2 4/19 >> GNRs >> Urine 4/19 >> 80k GNRs >>  ABX:  Vanc 4/19 >> 4/20 Levofloxacin 4/19 >> 4/19 (stopped due to rash) Cefepime 4/19 >>    PROPHYLAXIS:  DVT: SCDs  SUP: PPI  CONSULTANTS:  Renal 4/19   Filed Vitals:   05/23/11 1500 05/23/11 1600 05/23/11 1700 05/23/11 1800  BP:      Pulse: 63 60 63 114  Temp:      TempSrc:      Resp: 21 17 20 17   Height:      Weight:      SpO2: 100% 100% 100% 100%    EXAM:  Gen: Pleasant, cushingoid facies, WDWN, NAD HEENT: NAD Neck: No JVD Lungs: Clear to auscultation and percussion Cardiovascular: RRR s M Abdomen: Soft, NT, NABS Musculoskeletal: No C/C/E Neuro: Nonfocal Skin: No lesions  DATA: BMET    Component Value Date/Time   NA 135 05/23/2011 0430   K 4.2 05/23/2011 0430   CL 105 05/23/2011 0430   CO2 19 05/23/2011 0430   GLUCOSE 144* 05/23/2011 0430   BUN 40* 05/23/2011 0430   CREATININE 3.10* 05/23/2011 0430   CALCIUM 8.8 05/23/2011 0430   GFRNONAA 17* 05/23/2011 0430   GFRAA 20* 05/23/2011 0430    CBC    Component Value Date/Time   WBC 12.9* 05/23/2011 0430   RBC 2.92* 05/23/2011 0430   HGB 9.3* 05/23/2011 0430   HCT 27.2* 05/23/2011 0430   PLT 126* 05/23/2011 0430   MCV 93.2 05/23/2011 0430   MCH 31.8 05/23/2011 0430   MCHC 34.2 05/23/2011 0430   RDW 20.3* 05/23/2011 0430   LYMPHSABS 0.3* 05/22/2011 1238   MONOABS 0.5 05/22/2011 1238   EOSABS 0.0 05/22/2011 1238   BASOSABS 0.0 05/22/2011 1238    CXR: NACPD  IMPRESSION/PLAN:  Hypotension, resolving septic shock - presentation of N/V, fever, flank pain. Concerned for native kidney pyelonephritis with GNR bacteremia -D/C Vanc, cont cefepime pending ID of  organism -Cont stress dose steroids  Acute on chronic renal insufficiency after renal transplant - resuscitate as above - Renal consult appreciated - Cont anti-rejection medications  Gastroparesis with acute N/V - PRN Zofran - Resume Reglan    Merton Border, MD;  PCCM service; Mobile [phone]

## 2011-05-23 NOTE — Progress Notes (Signed)
Patient ID: Tina Watkins, female   DOB: 1965-04-24, 46 y.o.   MRN: [id number] S:feels better but "I'm puffy" O:BP 105/55  Pulse 92  Temp(Src) 99 F (37.2 C) (Oral)  Resp 5  Ht 4\' 11"  (1.499 m)  Wt 61.6 kg (135 lb 12.9 oz)  BMI 27.43 kg/m2  SpO2 97%  Intake/Output Summary (Last 24 hours) at 05/23/11 0842 Last data filed at 05/23/11 0600  Gross per 24 hour  Intake 2177.69 ml  Output    450 ml  Net 1727.69 ml   Weight change:  Gen:WD WN AAF with moon facies CVS:RRR no rub Resp:CTA [id number] tenderness to renal allograft in RLQ Ext:tr edema   Lab 05/23/11 0430 05/22/11 1238  NA 135 133*  K 4.2 4.5  CL 105 98  CO2 19 18*  GLUCOSE 144* 89  BUN 40* 38*  CREATININE 3.10* 2.89*  ALB -- --  CALCIUM 8.8 9.9  PHOS -- --  AST -- 30  ALT -- 11   Liver Function Tests:  Lab 05/22/11 1238  AST 30  ALT 11  ALKPHOS 75  BILITOT 1.3*  PROT 6.7  ALBUMIN 3.5    Lab 05/22/11 1238  LIPASE 20  AMYLASE --   No results found for this basename: AMMONIA:3 in the last 168 hours CBC:  Lab 05/23/11 0430 05/22/11 1238  WBC 12.9* 10.0  NEUTROABS -- 9.2*  HGB 9.3* 11.0*  HCT 27.2* 31.8*  MCV 93.2 91.9  PLT 126* PLATELET CLUMPS NOTED ON SMEAR, UNABLE TO ESTIMATE   Cardiac Enzymes:  Lab 05/22/11 2319 05/22/11 1635  CKTOTAL 87 81  CKMB 2.0 1.8  CKMBINDEX -- --  TROPONINI <0.30 <0.30   CBG:  Lab 05/22/11 1952  GLUCAP 119*    Iron Studies: No results found for this basename: IRON,TIBC,TRANSFERRIN,FERRITIN in the last 72 hours Studies/Results: Dg Chest 2 View  05/22/2011  *RADIOLOGY REPORT*  Clinical Data: 46 year old female with nausea vomiting fever, back pain, cough, tachycardia, dialysis.  CHEST - 2 VIEW  Comparison: 02/19/2011 and earlier.  Findings: Stable mediastinal metallic stent, may be within the SVC. Stable cardiac size and mediastinal contours.  Stable lung volumes. No pneumothorax or pneumoperitoneum.  No pulmonary edema, pleural effusion or acute pulmonary  opacity.  Stable epigastric surgical clips.  Paucity of bowel gas. No acute osseous abnormality identified.  IMPRESSION: No acute cardiopulmonary abnormality.  Original Report Authenticated By: Randall An, M.D.   Portable Chest Xray In Am  05/23/2011  *RADIOLOGY REPORT*  Clinical Data: Evaluate for pneumonia  PORTABLE CHEST - 1 VIEW  Comparison: 05/22/2011; 02/19/2011; chest CT - 10/24/2004  Findings: Grossly unchanged cardiac silhouette and mediastinal contours with a vascular stent overlying the expected location of the distal SVC.  Grossly unchanged bibasilar heterogeneous opacities, left greater than right.  There is persistent mild elevation of the right hemidiaphragm.  No definite pleural effusion or pneumothorax.  Grossly unchanged bones.  IMPRESSION: Grossly unchanged bibasilar heterogeneous opacities favored to represent atelectasis.  Original Report Authenticated By: Rachel Moulds, M.D.   Dg Chest Portable 1 View  05/22/2011  *RADIOLOGY REPORT*  Clinical Data: Status post failed central line attempt.  PORTABLE CHEST - 1 VIEW  Comparison: Two-view chest 05/22/2011.  Findings: An SVC stent is in place.  Heart size is normal.  The lungs are clear.  The there is no pneumothorax.  IMPRESSION:  1.  No acute cardiopulmonary disease or significant interval change. 2.  No pneumothorax.  Original Report Authenticated By: Resa Miner.  MATTERN, M.D.      . azaTHIOprine  100 mg Oral Daily  . ceFEPime (MAXIPIME) IV  1 g Intravenous Q24H  . fentaNYL  100 mcg Intravenous Once  . heparin  5,000 Units Subcutaneous Q8H  . hydrocortisone sodium succinate  100 mg Intravenous Once  . hydrocortisone sod succinate (SOLU-CORTEF) injection  50 mg Intravenous Q6H  .  HYDROmorphone (DILAUDID) injection  1 mg Intravenous Once  . levofloxacin (LEVAQUIN) IV  750 mg Intravenous Once  . ondansetron (ZOFRAN) IV  4 mg Intravenous Once  . pantoprazole (PROTONIX) IV  40 mg Intravenous QHS  . predniSONE  5 mg Oral  Daily  . promethazine  12.5 mg Intravenous Once  . sodium chloride  1,000 mL Intravenous Once  . sodium chloride  1,000 mL Intravenous Once  . sodium chloride  1,000 mL Intravenous Once  . sodium chloride  25 mL/kg Intravenous Once  . sodium chloride  500 mL Intravenous Once  . tacrolimus  2 mg Oral QHS  . tacrolimus  3 mg Oral Daily  . vancomycin  750 mg Intravenous Q24H  . vancomycin  1,000 mg Intravenous Once  . DISCONTD: ceFEPime (MAXIPIME) IV  1 g Intravenous Q12H  . DISCONTD: ceFEPime (MAXIPIME) IV  2 g Intravenous Once  . DISCONTD:  HYDROmorphone (DILAUDID) injection  1 mg Intravenous Once  . DISCONTD: levofloxacin (LEVAQUIN) IV  250 mg Intravenous Q24H  . DISCONTD: tacrolimus  2-3 mg Oral BID  . DISCONTD: vancomycin  1,000 mg Intravenous Once    BMET    Component Value Date/Time   NA 135 05/23/2011 0430   K 4.2 05/23/2011 0430   CL 105 05/23/2011 0430   CO2 19 05/23/2011 0430   GLUCOSE 144* 05/23/2011 0430   BUN 40* 05/23/2011 0430   CREATININE 3.10* 05/23/2011 0430   CALCIUM 8.8 05/23/2011 0430   GFRNONAA 17* 05/23/2011 0430   GFRAA 20* 05/23/2011 0430   CBC    Component Value Date/Time   WBC 12.9* 05/23/2011 0430   RBC 2.92* 05/23/2011 0430   HGB 9.3* 05/23/2011 0430   HCT 27.2* 05/23/2011 0430   PLT 126* 05/23/2011 0430   MCV 93.2 05/23/2011 0430   MCH 31.8 05/23/2011 0430   MCHC 34.2 05/23/2011 0430   RDW 20.3* 05/23/2011 0430   LYMPHSABS 0.3* 05/22/2011 1238   MONOABS 0.5 05/22/2011 1238   EOSABS 0.0 05/22/2011 1238   BASOSABS 0.0 05/22/2011 1238    Assessment/Plan:  1. AKI/CKD- likely due to ischemic ATN in setting of volume depletion/hypotension and sepsis. Agree with IVF's and follow UOP and renal function. 2. S/p LRD kidney transplant with allograft nephropathy. Continue with prograf 3mg  qam 2mg  qpm and immuran 100mg  qam. 3. Urosepsis- blood and urine cultures + for GNR,  Wean pressors per PCCM.  Cont with admission to ICU and broad spectrum abx with pressors 4. Flank  pain- will need [name] of transplanted kidney +/- CT scan, pan culture.  likley due to pyelonpehritis 5. Anemia of chronic disease- cont with epo. Last Hgb 10.3 and likely higher due to volume depletion 6. Tobacco abuse- stress smoking cessation 7. Volume depletion- now with some edema, will change IVF to kvo  Elya Tarquinio A

## 2011-05-24 ENCOUNTER — Inpatient Hospital Stay (HOSPITAL_COMMUNITY): Payer: Medicaid Other

## 2011-05-24 DIAGNOSIS — A419 Sepsis, unspecified organism: Secondary | ICD-10-CM

## 2011-05-24 LAB — POCT I-STAT 3, VENOUS BLOOD GAS (G3P V)
Acid-base deficit: 7 mmol/L — ABNORMAL HIGH (ref 0.0–2.0)
Bicarbonate: 17.3 mEq/L — ABNORMAL LOW (ref 20.0–24.0)
O2 Saturation: 67 %
Patient temperature: 98.6
TCO2: 18 mmol/L (ref 0–100)
pCO2, Ven: 30.2 mmHg — ABNORMAL LOW (ref 45.0–50.0)
pH, Ven: 7.366 — ABNORMAL HIGH (ref 7.250–7.300)
pO2, Ven: 35 mmHg (ref 30.0–45.0)

## 2011-05-24 LAB — RENAL FUNCTION PANEL
Albumin: 2.9 g/dL — ABNORMAL LOW (ref 3.5–5.2)
BUN: 52 mg/dL — ABNORMAL HIGH (ref 6–23)
CO2: 19 mEq/L (ref 19–32)
Calcium: 9.1 mg/dL (ref 8.4–10.5)
Chloride: 105 mEq/L (ref 96–112)
Creatinine, Ser: 3.57 mg/dL — ABNORMAL HIGH (ref 0.50–1.10)
GFR calc Af Amer: 17 mL/min — ABNORMAL LOW (ref >90)
GFR calc non Af Amer: 15 mL/min — ABNORMAL LOW (ref >90)
Glucose, Bld: 103 mg/dL — ABNORMAL HIGH (ref 70–99)
Phosphorus: 4.3 mg/dL (ref 2.3–4.6)
Potassium: 4.6 mEq/L (ref 3.5–5.1)
Sodium: 136 mEq/L (ref 135–145)

## 2011-05-24 LAB — URINE CULTURE
Colony Count: 80000
Colony Count: 5000
Culture  Setup Time: 201304191414
Culture  Setup Time: 201304202152

## 2011-05-24 LAB — CBC
HCT: 25 % — ABNORMAL LOW (ref 36.0–46.0)
Hemoglobin: 8.5 g/dL — ABNORMAL LOW (ref 12.0–15.0)
MCH: 31.1 pg (ref 26.0–34.0)
MCHC: 34 g/dL (ref 30.0–36.0)
MCV: 91.6 fL (ref 78.0–100.0)
Platelets: 131 10*3/uL — ABNORMAL LOW (ref 150–400)
RBC: 2.73 MIL/uL — ABNORMAL LOW (ref 3.87–5.11)
RDW: 20.2 % — ABNORMAL HIGH (ref 11.5–15.5)
WBC: 12.3 10*3/uL — ABNORMAL HIGH (ref 4.0–10.5)

## 2011-05-24 LAB — CREATININE, URINE, RANDOM: Creatinine, Urine: 210.71 mg/dL

## 2011-05-24 LAB — SODIUM, URINE, RANDOM: Sodium, Ur: <10 mEq/L

## 2011-05-24 MED ORDER — CEFAZOLIN SODIUM 1-5 GM-% IV SOLN: 1.0000 g | INTRAVENOUS | Status: DC

## 2011-05-24 MED ORDER — WHITE PETROLATUM GEL
Status: AC
  Administered 2011-05-24: 11:00:00
  Filled 2011-05-24: qty 5

## 2011-05-24 MED ORDER — CEFAZOLIN SODIUM 1-5 GM-% IV SOLN
1.0000 g | Freq: Two times a day (BID) | INTRAVENOUS | Status: DC
  Administered 2011-05-24 – 2011-05-25 (×4): 1 g via INTRAVENOUS
  Filled 2011-05-24 (×8): qty 50

## 2011-05-24 MED ORDER — FUROSEMIDE 10 MG/ML IJ SOLN
INTRAMUSCULAR | Status: AC
  Administered 2011-05-24: 09:00:00
  Filled 2011-05-24: qty 4

## 2011-05-24 MED ORDER — FUROSEMIDE 10 MG/ML IJ SOLN
40.0000 mg | Freq: Once | INTRAMUSCULAR | Status: AC
  Administered 2011-05-24: 40 mg via INTRAVENOUS

## 2011-05-24 MED ORDER — ALPRAZOLAM 0.25 MG PO TABS
0.2500 mg | ORAL_TABLET | Freq: Two times a day (BID) | ORAL | Status: DC | PRN
  Administered 2011-05-24 – 2011-05-30 (×6): 0.25 mg via ORAL
  Filled 2011-05-24 (×7): qty 1

## 2011-05-24 NOTE — Progress Notes (Signed)
Patient crying/screaming hanging her head over the trashcan while sitting on chair. "I can't take this, you have got to get this fluid out". Gave pt 1 mg Dilaudid per MD order. Bladder scan performed and 33 ml urine found. Notified MD. 19:05 patient stated she feels better. Patient lying on bed talking on her cell phone; seems to be resting comfortably. Will continue to monitor. Dudley Major RN

## 2011-05-24 NOTE — Progress Notes (Signed)
Patient ID: [name], female   DOB: [birth year], 46 y.o.   MRN: [id number]  C/O: Nurse asked to evaluate the patient for abdominal pain.  HPI: Miss Filar is a 45/F with urosepsis who was transferred to the renal unit today. She has abdominal pain x 2 days. As per the patient whenever she eats she gets bloated and her "stomach" gets distended. She is unable to move her bowel immediately and that causes her pain. Otherwise mostly she is fine. She is currently on Reglan for her gastroparesis.   She also complains of urge for urination and thinks that her bladder might have distended which causes her pain. In her words "I think my bladder is distended with urine". Of note the bladder scan did not reveal any urine.  She was also asking to be put on PCA for continuous dilaudid. As per the patient her tolerance for the pain medicine is high and hence morphine does not help her. Only medication that seems to be useful is Dilaudid (as per the patient). She also requested more frequent dilaudid.  O/E:  Gen: Pt lying comfortably on the bed watching TV. Afebrile. AAOX3 Chest: Bilateral air entry + CVS: S1S2 PA: Soft, non distended, mild tenderness + in the epigastric region. No rebound, no rigidity. BS +  Assessment and Plan: Following are the differentials: 1. Gastroparesis 2. Peptic ulcer disease. 3. Drug seeking behavior  Plan: 1. Cont. Reglan 2. Cont. Proto nix 3. PRN analgesic.  Emelda Brothers, MD

## 2011-05-24 NOTE — Progress Notes (Addendum)
Patient ID: Tina Watkins, female   DOB: 1965/09/15, 46 y.o.   MRN: [id number] S:very concerned about her kidney function, also c/o cough of clear sputum O:BP 107/65  Pulse 52  Temp(Src) 98.9 F (37.2 C) (Oral)  Resp 10  Ht 4\' 11"  (1.499 m)  Wt 61.9 kg (136 lb 7.4 oz)  BMI 27.56 kg/m2  SpO2 99%  Intake/Output Summary (Last 24 hours) at 05/24/11 0840 Last data filed at 05/24/11 0700  Gross per 24 hour  Intake    720 ml  Output    675 ml  Net     45 ml   Weight change: 1.9 kg (4 lb 3 oz) Gen:WD WN AAF cushingoid CVS:no rub Resp:CTA Abd:+BS, soft, NT/ND Ext:tr edema   Lab 05/24/11 0455 05/23/11 0430 05/22/11 1238  NA 136 135 133*  K 4.6 4.2 4.5  CL 105 105 98  CO2 19 19 18*  GLUCOSE 103* 144* 89  BUN 52* 40* 38*  CREATININE 3.57* 3.10* 2.89*  ALBUMIN 2.9* -- 3.5  CALCIUM 9.1 8.8 9.9  PHOS 4.3 -- --  AST -- -- 30  ALT -- -- 11   Liver Function Tests:  Lab 05/24/11 0455 05/22/11 1238  AST -- 30  ALT -- 11  ALKPHOS -- 75  BILITOT -- 1.3*  PROT -- 6.7  ALBUMIN 2.9* 3.5    Lab 05/22/11 1238  LIPASE 20  AMYLASE --   No results found for this basename: AMMONIA:3 in the last 168 hours CBC:  Lab 05/24/11 0455 05/23/11 0430 05/22/11 1238  WBC 12.3* 12.9* 10.0  NEUTROABS -- -- 9.2*  HGB 8.5* 9.3* 11.0*  HCT 25.0* 27.2* 31.8*  MCV 91.6 93.2 91.9  PLT 131* 126* PLATELET CLUMPS NOTED ON SMEAR, UNABLE TO ESTIMATE   Cardiac Enzymes:  Lab 05/23/11 0800 05/22/11 2319 05/22/11 1635  CKTOTAL 58 87 81  CKMB 1.9 2.0 1.8  CKMBINDEX -- -- --  TROPONINI <0.30 <0.30 <0.30   CBG:  Lab 05/22/11 1952  GLUCAP 119*    Iron Studies: No results found for this basename: IRON,TIBC,TRANSFERRIN,FERRITIN in the last 72 hours Studies/Results: Dg Chest 2 View  05/22/2011  *RADIOLOGY REPORT*  Clinical Data: 47 year old female with nausea vomiting fever, back pain, cough, tachycardia, dialysis.  CHEST - 2 VIEW  Comparison: 02/19/2011 and earlier.  Findings: Stable mediastinal  metallic stent, may be within the SVC. Stable cardiac size and mediastinal contours.  Stable lung volumes. No pneumothorax or pneumoperitoneum.  No pulmonary edema, pleural effusion or acute pulmonary opacity.  Stable epigastric surgical clips.  Paucity of bowel gas. No acute osseous abnormality identified.  IMPRESSION: No acute cardiopulmonary abnormality.  Original Report Authenticated By: Randall An, M.D.   Portable Chest Xray In Am  05/23/2011  *RADIOLOGY REPORT*  Clinical Data: Evaluate for pneumonia  PORTABLE CHEST - 1 VIEW  Comparison: 05/22/2011; 02/19/2011; chest CT - 10/24/2004  Findings: Grossly unchanged cardiac silhouette and mediastinal contours with a vascular stent overlying the expected location of the distal SVC.  Grossly unchanged bibasilar heterogeneous opacities, left greater than right.  There is persistent mild elevation of the right hemidiaphragm.  No definite pleural effusion or pneumothorax.  Grossly unchanged bones.  IMPRESSION: Grossly unchanged bibasilar heterogeneous opacities favored to represent atelectasis.  Original Report Authenticated By: Rachel Moulds, M.D.   Dg Chest Portable 1 View  05/22/2011  *RADIOLOGY REPORT*  Clinical Data: Status post failed central line attempt.  PORTABLE CHEST - 1 VIEW  Comparison: Two-view chest 05/22/2011.  Findings: An SVC stent is in place.  Heart size is normal.  The lungs are clear.  The there is no pneumothorax.  IMPRESSION:  1.  No acute cardiopulmonary disease or significant interval change. 2.  No pneumothorax.  Original Report Authenticated By: Resa Miner. MATTERN, M.D.      . azaTHIOprine  100 mg Oral Daily  . ceFEPime (MAXIPIME) IV  1 g Intravenous Q24H  . darbepoetin  100 mcg Subcutaneous Q14 Days  . fentaNYL  100 mcg Intravenous Once  . heparin  5,000 Units Subcutaneous Q8H  . hydrocortisone sod succinate (SOLU-CORTEF) injection  50 mg Intravenous Q8H  . metoCLOPramide (REGLAN) injection  5 mg Intravenous TID AC    . predniSONE  5 mg Oral Daily  . promethazine  12.5 mg Intravenous Once  . tacrolimus  2 mg Oral QHS  . tacrolimus  3 mg Oral Daily  . DISCONTD: hydrocortisone sod succinate (SOLU-CORTEF) injection  50 mg Intravenous Q6H  . DISCONTD: pantoprazole (PROTONIX) IV  40 mg Intravenous QHS  . DISCONTD: vancomycin  750 mg Intravenous Q24H  . DISCONTD: vancomycin  750 mg Intravenous Q24H    BMET    Component Value Date/Time   NA 136 05/24/2011 0455   K 4.6 05/24/2011 0455   CL 105 05/24/2011 0455   CO2 19 05/24/2011 0455   GLUCOSE 103* 05/24/2011 0455   BUN 52* 05/24/2011 0455   CREATININE 3.57* 05/24/2011 0455   CALCIUM 9.1 05/24/2011 0455   GFRNONAA 15* 05/24/2011 0455   GFRAA 17* 05/24/2011 0455   CBC    Component Value Date/Time   WBC 12.3* 05/24/2011 0455   RBC 2.73* 05/24/2011 0455   HGB 8.5* 05/24/2011 0455   HCT 25.0* 05/24/2011 0455   PLT 131* 05/24/2011 0455   MCV 91.6 05/24/2011 0455   MCH 31.1 05/24/2011 0455   MCHC 34.0 05/24/2011 0455   RDW 20.2* 05/24/2011 0455   LYMPHSABS 0.3* 05/22/2011 1238   MONOABS 0.5 05/22/2011 1238   EOSABS 0.0 05/22/2011 1238   BASOSABS 0.0 05/22/2011 1238    Assessment/Plan:  1. AKI/CKD- likely due to ischemic ATN in setting of volume depletion/hypotension and sepsis. Now she appears euvolemic/vol overloaded.  Will give dose of Lasix 40mg  IV and follow UOP.  Pt very anxious about her renal function because vascular access was an issue at time of transplant was running out of locations for HD.  No signs of uremia.  Cont to follow and will check FeNa. 2. E. Coli Urosepsis- blood and urine cultures + for E. Coli, Wean pressors per PCCM. Cont with admission to ICU and broad spectrum abx with pressors.  Recommend narrowing coverage to Ancef 1gm IV qd 3. S/p LRD kidney transplant with allograft nephropathy. Continue with prograf 3mg  qam 2mg  qpm and immuran 100mg  qam. 4. Flank pain- will need [name] of transplanted kidney +/- CT scan, pan culture. likley due to  pyelonpehritis 5. Anemia of chronic disease- cont with epo. Last Hgb 8.3 (missed last 2 doses of epo and normal hgb likely due to hemeconcentration from volume depletion) 6. Anxiety- will add xanax 0.25mg  bid prn 7. ?ileus- follow closely 8. Volume depletion- now with some edema, will change IVF to kvo  Lourine Alberico A  FeNa is low, will increase rate of IVF's and follow uop and Scr

## 2011-05-24 NOTE — Progress Notes (Signed)
Name: Tina Watkins MRN: [id number] DOB: [birth year] LOS: 2  PCCM PROGRESS NOTE  Background: S/P renal transplant in 2005 presented 4/19 with fever, hypotension (acute on chronic) R flank pain and N/V. Admitted with dx of presumed severe sepsis possibly due to native kidney pyelonephritis.  Subjective: c/o CP with white sputum production, emotional because worried about losing kidney   Lines, Tubes, etc: L femoral CVL 4/19 >>   Microbiology: Blood x 2 4/19 >> GNRs >>  Urine 4/19 >> 80k E. coli >> sensitive to ceftriaxone, cefazolin, cipro, gentamicin, zosyn Urine 4/20 >>  Antibiotics:  Vanc 4/19 >> 4/20  Levofloxacin 4/19 >> 4/19 (stopped due to rash)  Cefepime 4/19 >>   Studies/Events: None  Consults:  Renal  Vital Signs: Filed Vitals:   05/24/11 0500 05/24/11 0600 05/24/11 0700 05/24/11 0741  BP: 89/54 101/61 107/65   Pulse: 53 62 52   Temp:    98.9 F (37.2 C)  TempSrc:    Oral  Resp: 15 17 10    Height:      Weight:      SpO2: 98% 100% 99%     Intake/Output Summary (Last 24 hours) at 05/24/11 0831 Last data filed at 05/24/11 0700  Gross per 24 hour  Intake    720 ml  Output    675 ml  Net     45 ml    Physical Examination: General:  NAD Neuro:  AAO x 3 HEENT:  PERRL, no conjunctival pallor or scleral icterus  Cardiovascular:  RRR Lungs:  BTA b/l Abdomen:  Soft, NT, mildly distended  Musculoskeletal: trace b/l LE edema  Labs: Basic Metabolic Panel:  Lab [id number] 0455 05/23/11 0430  NA 136 135  K 4.6 4.2  CL 105 105  CO2 19 19  GLUCOSE 103* 144*  BUN 52* 40*  CREATININE 3.57* 3.10*  CALCIUM 9.1 8.8  MG -- --  PHOS 4.3 --   Liver Function Tests:  Lab 05/24/11 0455 05/22/11 1238  AST -- 30  ALT -- 11  ALKPHOS -- 75  BILITOT -- 1.3*  PROT -- 6.7  ALBUMIN 2.9* 3.5    Lab 05/22/11 1238  LIPASE 20  AMYLASE --   CBC:  Lab 05/24/11 0455 05/23/11 0430 05/22/11 1238  WBC 12.3* 12.9* --  NEUTROABS -- -- 9.2*  HGB 8.5* 9.3* --    HCT 25.0* 27.2* --  MCV 91.6 93.2 --  PLT 131* 126* --   Cardiac Enzymes:  Lab 05/23/11 0800 05/22/11 2319 05/22/11 1635  CKTOTAL 58 87 81  CKMB 1.9 2.0 1.8  CKMBINDEX -- -- --  TROPONINI <0.30 <0.30 <0.30   CBG:  Lab 05/22/11 1952  GLUCAP 119*   Urinalysis:  Lab 05/23/11 1316 05/22/11 1323  COLORURINE YELLOW AMBER*  LABSPEC 1.015 1.014  PHURINE 5.5 6.5  GLUCOSEU NEGATIVE NEGATIVE  HGBUR SMALL* LARGE*  BILIRUBINUR NEGATIVE SMALL*  KETONESUR NEGATIVE 15*  PROTEINUR 100* >300*  UROBILINOGEN 0.2 1.0  NITRITE NEGATIVE NEGATIVE  LEUKOCYTESUR SMALL* LARGE*    HOSPITAL MEDICATIONS:  I have reviewed the patient's current medications.     Assessment and Plan:  #Septic Shock: thought to be d/t native kidney pyelonephritis with GNR bacteremia & E.coli in urine culture -Change cefepime to ancef based on sensitivities  -consider d/c stress dose steroids tomorrow  #Acute on Chronic Renal insufficiency: s/p renal transplant, Cr trending up, thought to be d/t ATN in setting of hypotension vs infection -Renal consulted, continue antirejection meds -Urine studies collected -Will keep  femoral catheter in place for access -renal to treat with Lasix 40mg  IV today & follow UOP -renal US of transplanted kidney  #Normocytic Anemia: likely related to ACD d/t renal disease -monitor -continue aranesp  #Gastroparesis with acute N/V: no complaints, continue prn zofran & reglan  #Cough: CXR yesterday not suggestive of PNA, continue to monitor   #Anxiety: xanax 0.25mg  BID prn  #Dispo: transfer to renal floor, discussed with Dr. Thereasa Solo, PCCM to sign off tomorrow at [facility]: DVT: Heparin SUP: Not indicated Nutrition: Carb mod Glycemic control: None Sedation/analgesia: None  KAPADIA, NEEMA 05/24/2011, 8:29 AM   Seen on CCM rounds this morning with resident MD or ACNP above.  Pt examined and database reviewed. I agree with above findings, assessment and plan as  reflected in the note above.   Merton Border, MD;  PCCM service; Mobile [phone]

## 2011-05-25 LAB — COMPREHENSIVE METABOLIC PANEL
ALT: 16 U/L (ref 0–35)
AST: 33 U/L (ref 0–37)
Albumin: 3 g/dL — ABNORMAL LOW (ref 3.5–5.2)
Alkaline Phosphatase: 131 U/L — ABNORMAL HIGH (ref 39–117)
BUN: 69 mg/dL — ABNORMAL HIGH (ref 6–23)
CO2: 18 mEq/L — ABNORMAL LOW (ref 19–32)
Calcium: 9.4 mg/dL (ref 8.4–10.5)
Chloride: 102 mEq/L (ref 96–112)
Creatinine, Ser: 4.15 mg/dL — ABNORMAL HIGH (ref 0.50–1.10)
GFR calc Af Amer: 14 mL/min — ABNORMAL LOW (ref >90)
GFR calc non Af Amer: 12 mL/min — ABNORMAL LOW (ref >90)
Glucose, Bld: 100 mg/dL — ABNORMAL HIGH (ref 70–99)
Potassium: 4.3 mEq/L (ref 3.5–5.1)
Sodium: 133 mEq/L — ABNORMAL LOW (ref 135–145)
Total Bilirubin: 0.2 mg/dL — ABNORMAL LOW (ref 0.3–1.2)
Total Protein: 6.3 g/dL (ref 6.0–8.3)

## 2011-05-25 LAB — CBC
HCT: 25.3 % — ABNORMAL LOW (ref 36.0–46.0)
Hemoglobin: 8.5 g/dL — ABNORMAL LOW (ref 12.0–15.0)
MCH: 30.9 pg (ref 26.0–34.0)
MCHC: 33.6 g/dL (ref 30.0–36.0)
MCV: 92 fL (ref 78.0–100.0)
Platelets: 142 10*3/uL — ABNORMAL LOW (ref 150–400)
RBC: 2.75 MIL/uL — ABNORMAL LOW (ref 3.87–5.11)
RDW: 19.9 % — ABNORMAL HIGH (ref 11.5–15.5)
WBC: 7.7 10*3/uL (ref 4.0–10.5)

## 2011-05-25 LAB — CULTURE, BLOOD (ROUTINE X 2)
Culture  Setup Time: 201304192154
Culture  Setup Time: 201304192154

## 2011-05-25 MED ORDER — ALTEPLASE 2 MG IJ SOLR
2.0000 mg | Freq: Once | INTRAMUSCULAR | Status: AC
  Administered 2011-05-25: 2 mg
  Filled 2011-05-25 (×2): qty 2

## 2011-05-25 MED ORDER — HYDROCORTISONE SOD SUCCINATE 100 MG IJ SOLR: 50.0000 mg | Freq: Two times a day (BID) | INTRAMUSCULAR | Status: DC

## 2011-05-25 MED ORDER — SODIUM CHLORIDE 0.9 % IJ SOLN
10.0000 mL | INTRAMUSCULAR | Status: DC | PRN
  Administered 2011-05-25 – 2011-05-26 (×7): 10 mL
  Administered 2011-05-27: 20 mL
  Administered 2011-05-27 – 2011-05-30 (×8): 10 mL
  Administered 2011-05-30: 30 mL
  Administered 2011-05-30 (×2): 10 mL

## 2011-05-25 MED ORDER — SODIUM CHLORIDE 0.9 % IJ SOLN
10.0000 mL | Freq: Two times a day (BID) | INTRAMUSCULAR | Status: DC
  Administered 2011-05-25 – 2011-05-26 (×3): 10 mL
  Administered 2011-05-26: 30 mL
  Administered 2011-05-27 – 2011-05-28 (×4): 10 mL

## 2011-05-25 MED ORDER — ALTEPLASE 2 MG IJ SOLR
2.0000 mg | Freq: Once | INTRAMUSCULAR | Status: AC
  Administered 2011-05-25: 2 mg
  Filled 2011-05-25: qty 2

## 2011-05-25 NOTE — Progress Notes (Addendum)
Subjective: No new c/o Eating small amounts   Objective: Vital signs in last 24 hours: Filed Vitals:   05/24/11 1708 05/24/11 2100 05/25/11 0500 05/25/11 0937  BP: 95/58 92/66 91/51  112/70  Pulse: 60 86 55 52  Temp: 97.6 F (36.4 C) 98 F (36.7 C) 98.3 F (36.8 C) 98.4 F (36.9 C)  TempSrc: Oral Oral Oral Oral  Resp: 18 18 20 18   Height:      Weight:      SpO2: 98% 100% 99% 98%   Weight change:   Intake/Output Summary (Last 24 hours) at 05/25/11 0941 Last data filed at 05/25/11 0940  Gross per 24 hour  Intake   1385 ml  Output    450 ml  Net    935 ml    Physical Exam: General: Awake, Oriented, No acute distress. HEENT: EOMI. Neck: Supple CV: S1 and S2 Lungs: Clear to ascultation bilaterally Abdomen: Soft, Nontender, Nondistended, +bowel sounds. Ext: Good pulses. Trace edema.   Lab Results:  [facility] 05/24/11 0455 05/23/11 0430  NA 136 135  K 4.6 4.2  CL 105 105  CO2 19 19  GLUCOSE 103* 144*  BUN 52* 40*  CREATININE 3.57* 3.10*  CALCIUM 9.1 8.8  MG -- --  PHOS 4.3 --    Basename 05/24/11 0455 05/22/11 1238  AST -- 30  ALT -- 11  ALKPHOS -- 75  BILITOT -- 1.3*  PROT -- 6.7  ALBUMIN 2.9* 3.5    Basename 05/22/11 1238  LIPASE 20  AMYLASE --    Basename 05/24/11 0455 05/23/11 0430 05/22/11 1238  WBC 12.3* 12.9* --  NEUTROABS -- -- 9.2*  HGB 8.5* 9.3* --  HCT 25.0* 27.2* --  MCV 91.6 93.2 --  PLT 131* 126* --    Basename 05/23/11 0800 05/22/11 2319 05/22/11 1635  CKTOTAL 58 87 81  CKMB 1.9 2.0 1.8  CKMBINDEX -- -- --  TROPONINI <0.30 <0.30 <0.30   No components found with this basename: POCBNP:3 No results found for this basename: DDIMER:2 in the last 72 hours No results found for this basename: HGBA1C:2 in the last 72 hours No results found for this basename: CHOL:2,HDL:2,LDLCALC:2,TRIG:2,CHOLHDL:2,LDLDIRECT:2 in the last 72 hours No results found for this basename: TSH,T4TOTAL,FREET3,T3FREE,THYROIDAB in the last 72 hours No  results found for this basename: VITAMINB12:2,FOLATE:2,FERRITIN:2,TIBC:2,IRON:2,RETICCTPCT:2 in the last 72 hours  Micro Results: Recent Results (from the past 240 hour(s))  CULTURE, BLOOD (ROUTINE X 2)     Status: Normal   Collection Time   05/22/11 12:00 PM      Component Value Range Status Comment   Specimen Description BLOOD LEFT ARM   Final    Special Requests BOTTLES DRAWN AEROBIC ONLY 1.5CC   Final    Culture  Setup Time [id number]   Final    Culture     Final    Value: ESCHERICHIA COLI     Note: Gram Stain Report Called to,Read Back By and Verified With: THERESA CRITE @0735  ON 05/23/11 BY MCLET   Report Status 05/25/2011 FINAL   Final    Organism ID, Bacteria ESCHERICHIA COLI   Final   CULTURE, BLOOD (ROUTINE X 2)     Status: Normal   Collection Time   05/22/11 12:18 PM      Component Value Range Status Comment   Specimen Description BLOOD LEFT HAND   Final    Special Requests BOTTLES DRAWN AEROBIC ONLY [facility]   Final    Culture  Setup Time [id number]   Final  Culture     Final    Value: ESCHERICHIA COLI     Note: SUSCEPTIBILITIES PERFORMED ON PREVIOUS CULTURE WITHIN THE LAST 5 DAYS.     Note: Gram Stain Report Called to,Read Back By and Verified With: RN SABO ON 05/23/11 AT 1142 BY TEDAR   Report Status 05/25/2011 FINAL   Final   URINE CULTURE     Status: Normal   Collection Time   05/22/11  1:23 PM      Component Value Range Status Comment   Specimen Description URINE, RANDOM   Final    Special Requests NONE   Final    Culture  Setup Time [id number]   Final    Colony Count 80,000 COLONIES/ML   Final    Culture ESCHERICHIA COLI   Final    Report Status 05/24/2011 FINAL   Final    Organism ID, Bacteria ESCHERICHIA COLI   Final   MRSA PCR SCREENING     Status: Normal   Collection Time   05/22/11  7:21 PM      Component Value Range Status Comment   MRSA by PCR NEGATIVE  NEGATIVE  Final   URINE CULTURE     Status: Normal   Collection Time   05/23/11  1:16 PM       Component Value Range Status Comment   Specimen Description URINE, CLEAN CATCH   Final    Special Requests Immunocompromised   Final    Culture  Setup Time [id number]   Final    Colony Count 5,000 COLONIES/ML   Final    Culture INSIGNIFICANT GROWTH   Final    Report Status 05/24/2011 FINAL   Final     Studies/Results: US Renal Transplant W/doppler  05/24/2011  *RADIOLOGY REPORT*  Clinical Data:  Acute renal failure and transplant patient with tenderness over the right lower quadrant allograft.  Evaluate for possible rejection  ULTRASOUND OF RENAL TRANSPLANT  Technique: Ultrasound examination of the renal transplant was performed, with color and duplex Doppler evaluation.  Comparison:  Renal ultrasound - 08/24/2008  Findings:  Transplant kidney location:  Right lower quadrant  Transplant kidney description:  The right lower quadrant transplant kidney is unchanged in size measuring approximately 12.4 cm in length.  Unchanged cortical echogenicity and thickness.  No hydronephrosis.  There is a very minimal amount of fluid adjacent to the superior pole of the right kidney (image 51).  Color flow in the main renal artery at the hilum:  Present  Color flow in the main renal vein at the hilum:  Present  Resistive indices:       Main renal artery:  0.84       Upper pole segmental renal artery: 0.69       Lower pole segmental renal artery:  0.64  Bladder:  Normal for degree of bladder distention.  IMPRESSION: 1.  Grossly unchanged appearance of right lower quadrant transplant kidney.  No evidence of hydronephrosis. 2.  There is a very minimal amount of fluid adjacent to the superior pole right kidney.  No drainable fluid collections. 3.  No definite evidence of rejection with borderline elevated resistive indices within the main renal artery but normal resistive indices within the upper and lower poles of the transplant kidney.  Original Report Authenticated By: Rachel Moulds, M.D.    Medications: I have  reviewed the patient's current medications. Scheduled Meds:   . azaTHIOprine  100 mg Oral Daily  .  ceFAZolin (ANCEF) IV  1  g Intravenous Q12H  . darbepoetin  100 mcg Subcutaneous Q14 Days  . heparin  5,000 Units Subcutaneous Q8H  . hydrocortisone sod succinate (SOLU-CORTEF) injection  50 mg Intravenous Q8H  . metoCLOPramide (REGLAN) injection  5 mg Intravenous TID AC  . predniSONE  5 mg Oral Daily  . sodium chloride  10-40 mL Intracatheter Q12H  . tacrolimus  2 mg Oral QHS  . tacrolimus  3 mg Oral Daily  . white petrolatum      . DISCONTD:  ceFAZolin (ANCEF) IV  1 g Intravenous Q24H  . DISCONTD: ceFEPime (MAXIPIME) IV  1 g Intravenous Q24H  . DISCONTD: fentaNYL  100 mcg Intravenous Once  . DISCONTD: promethazine  12.5 mg Intravenous Once   Continuous Infusions:   . sodium chloride 50 mL/hr at 05/24/11 1701   PRN Meds:.acetaminophen, acetaminophen, ALPRAZolam, diphenhydrAMINE, HYDROmorphone (DILAUDID) injection, ondansetron, sodium chloride  Assessment/Plan: 1.  AKI/CKD- likely due to ischemic ATN in setting of volume depletion/hypotension and sepsis. Now she appears euvolemic/vol overloaded. Will give dose of Lasix 40mg  IV and follow UOP.management per nephrology E. Coli Urosepsis/septic shock- blood and urine cultures + for E. Coli, ancef, off pressors, d/c stress dose steroids, tomorrow: de-escalate to PO ceftin 500mg  bid 2. S/p LRD kidney transplant with allograft nephropathy. Continue with prograf 3mg  qam 2mg  qpm and immuran 100mg  qam. 3. Anemia of chronic disease- cont with epo. Last Hgb 8.3 (missed last 2 doses of epo and normal hgb likely due to hemeconcentration from volume depletion) 4. Anxiety- will add xanax 0.25mg  bid prn 5. gastroparesis- follow closely, zofran/reglan 6. Volume depletion- now with some edema, will change IVF to kvo  Ambulate pt- if need to consult PT will do   LOS: 3 days  Charna Neeb, DO 05/25/2011, 9:41 AM

## 2011-05-25 NOTE — Progress Notes (Signed)
Utilization review completed. Tina Watkins 05/25/2011

## 2011-05-25 NOTE — Progress Notes (Signed)
[facility] ROUNDING NOTE   Subjective:   Interval History: none.  Objective:  Vital signs in last 24 hours:  Temp:  [97.6 F (36.4 C)-98.5 F (36.9 C)] 98.3 F (36.8 C) (04/22 0500) Pulse Rate:  [44-86] 55  (04/22 0500) Resp:  [16-22] 20  (04/22 0500) BP: (87-114)/(51-75) 91/51 mmHg (04/22 0500) SpO2:  [98 %-100 %] 99 % (04/22 0500)  Weight change:  Filed Weights   05/22/11 1841 05/23/11 0500 05/24/11 0300  Weight: 60.1 kg (132 lb 7.9 oz) 61.6 kg (135 lb 12.9 oz) 61.9 kg (136 lb 7.4 oz)    Intake/Output: I/O last 3 completed shifts: In: 1410 [P.O.:960; I.V.:390; IV Piggyback:60] Out: 825 [Urine:825]   Intake/Output this shift:    Cushinoid appearance CVS- RRR RS- CTA ABD- BS present soft non-distended EXT- no edema   Basic Metabolic Panel:  Lab [id number] 0455 05/23/11 0430 05/22/11 1238  NA 136 135 133*  K 4.6 4.2 4.5  CL 105 105 98  CO2 19 19 18*  GLUCOSE 103* 144* 89  BUN 52* 40* 38*  CREATININE 3.57* 3.10* 2.89*  CALCIUM 9.1 8.8 9.9  MG -- -- --  PHOS 4.3 -- --    Liver Function Tests:  Lab 05/24/11 0455 05/22/11 1238  AST -- 30  ALT -- 11  ALKPHOS -- 75  BILITOT -- 1.3*  PROT -- 6.7  ALBUMIN 2.9* 3.5    Lab 05/22/11 1238  LIPASE 20  AMYLASE --   No results found for this basename: AMMONIA:3 in the last 168 hours  CBC:  Lab 05/24/11 0455 05/23/11 0430 05/22/11 1238  WBC 12.3* 12.9* 10.0  NEUTROABS -- -- 9.2*  HGB 8.5* 9.3* 11.0*  HCT 25.0* 27.2* 31.8*  MCV 91.6 93.2 91.9  PLT 131* 126* PLATELET CLUMPS NOTED ON SMEAR, UNABLE TO ESTIMATE    Cardiac Enzymes:  Lab 05/23/11 0800 05/22/11 2319 05/22/11 1635  CKTOTAL 58 87 81  CKMB 1.9 2.0 1.8  CKMBINDEX -- -- --  TROPONINI <0.30 <0.30 <0.30    BNP: No components found with this basename: POCBNP:5  CBG:  Lab 05/22/11 1952  GLUCAP 119*    Microbiology: Results for orders placed during the hospital encounter of 05/22/11  CULTURE, BLOOD (ROUTINE X 2)      Status: Normal (Preliminary result)   Collection Time   05/22/11 12:00 PM      Component Value Range Status Comment   Specimen Description BLOOD LEFT ARM   Final    Special Requests BOTTLES DRAWN AEROBIC ONLY 1.5CC   Final    Culture  Setup Time [id number]   Final    Culture     Final    Value: GRAM NEGATIVE RODS     Note: Gram Stain Report Called to,Read Back By and Verified With: THERESA CRITE @0735  ON 05/23/11 BY MCLET   Report Status PENDING   Incomplete   CULTURE, BLOOD (ROUTINE X 2)     Status: Normal (Preliminary result)   Collection Time   05/22/11 12:18 PM      Component Value Range Status Comment   Specimen Description BLOOD LEFT HAND   Final    Special Requests BOTTLES DRAWN AEROBIC ONLY [facility]   Final    Culture  Setup Time [id number]   Final    Culture     Final    Value: GRAM NEGATIVE RODS     Note: Gram Stain Report Called to,Read Back By and Verified With: RN SABO ON 05/23/11 AT 1142  BY TEDAR   Report Status PENDING   Incomplete   URINE CULTURE     Status: Normal   Collection Time   05/22/11  1:23 PM      Component Value Range Status Comment   Specimen Description URINE, RANDOM   Final    Special Requests NONE   Final    Culture  Setup Time [id number]   Final    Colony Count 80,000 COLONIES/ML   Final    Culture ESCHERICHIA COLI   Final    Report Status 05/24/2011 FINAL   Final    Organism ID, Bacteria ESCHERICHIA COLI   Final   MRSA PCR SCREENING     Status: Normal   Collection Time   05/22/11  7:21 PM      Component Value Range Status Comment   MRSA by PCR NEGATIVE  NEGATIVE  Final   URINE CULTURE     Status: Normal   Collection Time   05/23/11  1:16 PM      Component Value Range Status Comment   Specimen Description URINE, CLEAN CATCH   Final    Special Requests Immunocompromised   Final    Culture  Setup Time [id number]   Final    Colony Count 5,000 COLONIES/ML   Final    Culture INSIGNIFICANT GROWTH   Final    Report Status 05/24/2011 FINAL   Final       Coagulation Studies: No results found for this basename: LABPROT:5,INR:5 in the last 72 hours  Urinalysis:  Basename 05/23/11 1316 05/22/11 1323  COLORURINE YELLOW AMBER*  LABSPEC 1.015 1.014  PHURINE 5.5 6.5  GLUCOSEU NEGATIVE NEGATIVE  HGBUR SMALL* LARGE*  BILIRUBINUR NEGATIVE SMALL*  KETONESUR NEGATIVE 15*  PROTEINUR 100* >300*  UROBILINOGEN 0.2 1.0  NITRITE NEGATIVE NEGATIVE  LEUKOCYTESUR SMALL* LARGE*      Imaging: US Renal Transplant W/doppler  05/24/2011  *RADIOLOGY REPORT*  Clinical Data:  Acute renal failure and transplant patient with tenderness over the right lower quadrant allograft.  Evaluate for possible rejection  ULTRASOUND OF RENAL TRANSPLANT  Technique: Ultrasound examination of the renal transplant was performed, with color and duplex Doppler evaluation.  Comparison:  Renal ultrasound - 08/24/2008  Findings:  Transplant kidney location:  Right lower quadrant  Transplant kidney description:  The right lower quadrant transplant kidney is unchanged in size measuring approximately 12.4 cm in length.  Unchanged cortical echogenicity and thickness.  No hydronephrosis.  There is a very minimal amount of fluid adjacent to the superior pole of the right kidney (image 51).  Color flow in the main renal artery at the hilum:  Present  Color flow in the main renal vein at the hilum:  Present  Resistive indices:       Main renal artery:  0.84       Upper pole segmental renal artery: 0.69       Lower pole segmental renal artery:  0.64  Bladder:  Normal for degree of bladder distention.  IMPRESSION: 1.  Grossly unchanged appearance of right lower quadrant transplant kidney.  No evidence of hydronephrosis. 2.  There is a very minimal amount of fluid adjacent to the superior pole right kidney.  No drainable fluid collections. 3.  No definite evidence of rejection with borderline elevated resistive indices within the main renal artery but normal resistive indices within the upper and  lower poles of the transplant kidney.  Original Report Authenticated By: Rachel Moulds, M.D.     Medications:      .  sodium chloride 50 mL/hr at 05/24/11 1701      . azaTHIOprine  100 mg Oral Daily  .  ceFAZolin (ANCEF) IV  1 g Intravenous Q12H  . darbepoetin  100 mcg Subcutaneous Q14 Days  . furosemide      . furosemide  40 mg Intravenous Once  . heparin  5,000 Units Subcutaneous Q8H  . hydrocortisone sod succinate (SOLU-CORTEF) injection  50 mg Intravenous Q8H  . metoCLOPramide (REGLAN) injection  5 mg Intravenous TID AC  . predniSONE  5 mg Oral Daily  . sodium chloride  10-40 mL Intracatheter Q12H  . tacrolimus  2 mg Oral QHS  . tacrolimus  3 mg Oral Daily  . white petrolatum      . DISCONTD:  ceFAZolin (ANCEF) IV  1 g Intravenous Q24H  . DISCONTD: ceFEPime (MAXIPIME) IV  1 g Intravenous Q24H  . DISCONTD: fentaNYL  100 mcg Intravenous Once  . DISCONTD: promethazine  12.5 mg Intravenous Once   acetaminophen, acetaminophen, ALPRAZolam, diphenhydrAMINE, HYDROmorphone (DILAUDID) injection, ondansetron, sodium chloride  Assessment/ Plan:   ESRD-s/p transplant x 3 Acute renal failure in setting of E Coli sepsis  ANEMIA-last HB 8.5  MBD-stable  HTN/VOL-controlled Blood pressure soft  Awaiting results today will follow continue immunosuppression   LOS: 3 Kaleea Penner W @TODAY @8 :39 AM

## 2011-05-26 LAB — BASIC METABOLIC PANEL
BUN: 66 mg/dL — ABNORMAL HIGH (ref 6–23)
CO2: 19 mEq/L (ref 19–32)
Calcium: 9.5 mg/dL (ref 8.4–10.5)
Chloride: 106 mEq/L (ref 96–112)
Creatinine, Ser: 3.5 mg/dL — ABNORMAL HIGH (ref 0.50–1.10)
GFR calc Af Amer: 17 mL/min — ABNORMAL LOW (ref >90)
GFR calc non Af Amer: 15 mL/min — ABNORMAL LOW (ref >90)
Glucose, Bld: 96 mg/dL (ref 70–99)
Potassium: 3.7 mEq/L (ref 3.5–5.1)
Sodium: 136 mEq/L (ref 135–145)

## 2011-05-26 MED ORDER — FUROSEMIDE 10 MG/ML IJ SOLN
40.0000 mg | Freq: Once | INTRAMUSCULAR | Status: AC
  Administered 2011-05-26: 40 mg via INTRAVENOUS
  Filled 2011-05-26 (×3): qty 4

## 2011-05-26 MED ORDER — ALTEPLASE 2 MG IJ SOLR
2.0000 mg | Freq: Once | INTRAMUSCULAR | Status: AC
  Administered 2011-05-26: 2 mg
  Filled 2011-05-26: qty 2

## 2011-05-26 MED ORDER — GUAIFENESIN 100 MG/5ML PO SYRP
200.0000 mg | ORAL_SOLUTION | Freq: Four times a day (QID) | ORAL | Status: DC | PRN
  Administered 2011-05-26: 200 mg via ORAL
  Filled 2011-05-26: qty 118

## 2011-05-26 MED ORDER — POLYETHYLENE GLYCOL 3350 17 G PO PACK
17.0000 g | PACK | Freq: Every day | ORAL | Status: DC
  Administered 2011-05-26 – 2011-05-29 (×4): 17 g via ORAL
  Filled 2011-05-26 (×6): qty 1

## 2011-05-26 MED ORDER — CEFUROXIME AXETIL 500 MG PO TABS
500.0000 mg | ORAL_TABLET | Freq: Two times a day (BID) | ORAL | Status: DC
  Administered 2011-05-26 – 2011-05-30 (×9): 500 mg via ORAL
  Filled 2011-05-26 (×14): qty 1

## 2011-05-26 MED ORDER — NYSTATIN 100000 UNIT/ML MT SUSP
10.0000 mL | Freq: Three times a day (TID) | OROMUCOSAL | Status: DC
  Administered 2011-05-26 – 2011-05-29 (×6): 1000000 [IU] via ORAL
  Administered 2011-05-30: 500000 [IU] via ORAL
  Filled 2011-05-26 (×21): qty 10

## 2011-05-26 NOTE — Progress Notes (Signed)
Nursing Note  Pt c/o difficulty breathing and c/o "something growing" in her throat. Lung sounds diminished, but unchanged from yesterday. Facial edema still present. O2 sats 99% on RA. Pt placed on 2L O2 n/c for comfort. Dr. Justin Mend at bedside and assessed patient. Will continue to monitor. C.Alinah Sheard, RN.

## 2011-05-26 NOTE — Progress Notes (Signed)
[facility] ROUNDING NOTE   Subjective:   Interval History: has complaints sore throat and mouth  Objective:  Vital signs in last 24 hours:  Temp:  [97.8 F (36.6 C)-98.5 F (36.9 C)] 98.3 F (36.8 C) (04/23 0526) Pulse Rate:  [50-62] 50  (04/23 0526) Resp:  [18] 18  (04/23 0526) BP: (96-112)/(60-70) 96/68 mmHg (04/23 0526) SpO2:  [96 %-99 %] 99 % (04/23 0526) Weight:  [61.9 kg (136 lb 7.4 oz)] 61.9 kg (136 lb 7.4 oz) (04/22 2047)  Weight change:  Filed Weights   05/23/11 0500 05/24/11 0300 05/25/11 2047  Weight: 61.6 kg (135 lb 12.9 oz) 61.9 kg (136 lb 7.4 oz) 61.9 kg (136 lb 7.4 oz)    Intake/Output: I/O last 3 completed shifts: In: 2661.7 [P.O.:2040; I.V.:571.7; IV Piggyback:50] Out: 1000 [Urine:1000]   Intake/Output this shift:    cushinoid   Tongue with thrush CVS- RRR RS- CTA ABD- BS present soft non-distended EXT- no edema   Basic Metabolic Panel:  Lab 0000000 0830 05/24/11 0455 05/23/11 0430 05/22/11 1238  NA 133* 136 135 133*  K 4.3 4.6 4.2 4.5  CL 102 105 105 98  CO2 18* 19 19 18*  GLUCOSE 100* 103* 144* 89  BUN 69* 52* 40* 38*  CREATININE 4.15* 3.57* 3.10* 2.89*  CALCIUM 9.4 9.1 8.8 --  MG -- -- -- --  PHOS -- 4.3 -- --    Liver Function Tests:  Lab 05/25/11 0830 05/24/11 0455 05/22/11 1238  AST 33 -- 30  ALT 16 -- 11  ALKPHOS 131* -- 75  BILITOT 0.2* -- 1.3*  PROT 6.3 -- 6.7  ALBUMIN 3.0* 2.9* 3.5    Lab 05/22/11 1238  LIPASE 20  AMYLASE --   No results found for this basename: AMMONIA:3 in the last 168 hours  CBC:  Lab 05/25/11 0830 05/24/11 0455 05/23/11 0430 05/22/11 1238  WBC 7.7 12.3* 12.9* 10.0  NEUTROABS -- -- -- 9.2*  HGB 8.5* 8.5* 9.3* 11.0*  HCT 25.3* 25.0* 27.2* 31.8*  MCV 92.0 91.6 93.2 91.9  PLT 142* 131* 126* PLATELET CLUMPS NOTED ON SMEAR, UNABLE TO ESTIMATE    Cardiac Enzymes:  Lab 05/23/11 0800 05/22/11 2319 05/22/11 1635  CKTOTAL 58 87 81  CKMB 1.9 2.0 1.8  CKMBINDEX -- -- --    TROPONINI <0.30 <0.30 <0.30    BNP: No components found with this basename: POCBNP:5  CBG:  Lab 05/22/11 1952  GLUCAP 119*    Microbiology: Results for orders placed during the hospital encounter of 05/22/11  CULTURE, BLOOD (ROUTINE X 2)     Status: Normal   Collection Time   05/22/11 12:00 PM      Component Value Range Status Comment   Specimen Description BLOOD LEFT ARM   Final    Special Requests BOTTLES DRAWN AEROBIC ONLY 1.5CC   Final    Culture  Setup Time [id number]   Final    Culture     Final    Value: ESCHERICHIA COLI     Note: Gram Stain Report Called to,Read Back By and Verified With: THERESA CRITE @0735  ON 05/23/11 BY MCLET   Report Status 05/25/2011 FINAL   Final    Organism ID, Bacteria ESCHERICHIA COLI   Final   CULTURE, BLOOD (ROUTINE X 2)     Status: Normal   Collection Time   05/22/11 12:18 PM      Component Value Range Status Comment   Specimen Description BLOOD LEFT HAND   Final  Special Requests BOTTLES DRAWN AEROBIC ONLY [facility]   Final    Culture  Setup Time [id number]   Final    Culture     Final    Value: ESCHERICHIA COLI     Note: SUSCEPTIBILITIES PERFORMED ON PREVIOUS CULTURE WITHIN THE LAST 5 DAYS.     Note: Gram Stain Report Called to,Read Back By and Verified With: RN SABO ON 05/23/11 AT 1142 BY TEDAR   Report Status 05/25/2011 FINAL   Final   URINE CULTURE     Status: Normal   Collection Time   05/22/11  1:23 PM      Component Value Range Status Comment   Specimen Description URINE, RANDOM   Final    Special Requests NONE   Final    Culture  Setup Time [id number]   Final    Colony Count 80,000 COLONIES/ML   Final    Culture ESCHERICHIA COLI   Final    Report Status 05/24/2011 FINAL   Final    Organism ID, Bacteria ESCHERICHIA COLI   Final   MRSA PCR SCREENING     Status: Normal   Collection Time   05/22/11  7:21 PM      Component Value Range Status Comment   MRSA by PCR NEGATIVE  NEGATIVE  Final   URINE CULTURE     Status: Normal    Collection Time   05/23/11  1:16 PM      Component Value Range Status Comment   Specimen Description URINE, CLEAN CATCH   Final    Special Requests Immunocompromised   Final    Culture  Setup Time [id number]   Final    Colony Count 5,000 COLONIES/ML   Final    Culture INSIGNIFICANT GROWTH   Final    Report Status 05/24/2011 FINAL   Final     Coagulation Studies: No results found for this basename: LABPROT:5,INR:5 in the last 72 hours  Urinalysis:  Basename 05/23/11 1316  COLORURINE YELLOW  LABSPEC 1.015  PHURINE 5.5  GLUCOSEU NEGATIVE  HGBUR SMALL*  BILIRUBINUR NEGATIVE  KETONESUR NEGATIVE  PROTEINUR 100*  UROBILINOGEN 0.2  NITRITE NEGATIVE  LEUKOCYTESUR SMALL*      Imaging: US Renal Transplant W/doppler  05/24/2011  *RADIOLOGY REPORT*  Clinical Data:  Acute renal failure and transplant patient with tenderness over the right lower quadrant allograft.  Evaluate for possible rejection  ULTRASOUND OF RENAL TRANSPLANT  Technique: Ultrasound examination of the renal transplant was performed, with color and duplex Doppler evaluation.  Comparison:  Renal ultrasound - 08/24/2008  Findings:  Transplant kidney location:  Right lower quadrant  Transplant kidney description:  The right lower quadrant transplant kidney is unchanged in size measuring approximately 12.4 cm in length.  Unchanged cortical echogenicity and thickness.  No hydronephrosis.  There is a very minimal amount of fluid adjacent to the superior pole of the right kidney (image 51).  Color flow in the main renal artery at the hilum:  Present  Color flow in the main renal vein at the hilum:  Present  Resistive indices:       Main renal artery:  0.84       Upper pole segmental renal artery: 0.69       Lower pole segmental renal artery:  0.64  Bladder:  Normal for degree of bladder distention.  IMPRESSION: 1.  Grossly unchanged appearance of right lower quadrant transplant kidney.  No evidence of hydronephrosis. 2.  There is a  very minimal amount of fluid adjacent  to the superior pole right kidney.  No drainable fluid collections. 3.  No definite evidence of rejection with borderline elevated resistive indices within the main renal artery but normal resistive indices within the upper and lower poles of the transplant kidney.  Original Report Authenticated By: Rachel Moulds, M.D.     Medications:      . sodium chloride 50 mL/hr at 05/25/11 2246      . alteplase  2 mg Intracatheter Once  . alteplase  2 mg Intracatheter Once  . azaTHIOprine  100 mg Oral Daily  .  ceFAZolin (ANCEF) IV  1 g Intravenous Q12H  . darbepoetin  100 mcg Subcutaneous Q14 Days  . heparin  5,000 Units Subcutaneous Q8H  . metoCLOPramide (REGLAN) injection  5 mg Intravenous TID AC  . nystatin  10 mL Oral TID AC & HS  . predniSONE  5 mg Oral Daily  . sodium chloride  10-40 mL Intracatheter Q12H  . tacrolimus  2 mg Oral QHS  . tacrolimus  3 mg Oral Daily  . DISCONTD: hydrocortisone sod succinate (SOLU-CORTEF) injection  50 mg Intravenous Q8H  . DISCONTD: hydrocortisone sod succinate (SOLU-CORTEF) injection  50 mg Intravenous Q12H   acetaminophen, acetaminophen, ALPRAZolam, diphenhydrAMINE, guaifenesin, HYDROmorphone (DILAUDID) injection, ondansetron, sodium chloride  Assessment/ Plan:  ESRD-s/p transplant x 3 Acute renal failure in setting of E Coli sepsis  ANEMIA-last HB 8.5  MBD-stable  HTN/VOL-controlled Blood pressure soft Thrush start Nystatin  Patient with    ARF following Ecoli sepsis anticipate recovery   LOS: 4 Emily Massar W @TODAY @8 :24 AM

## 2011-05-26 NOTE — Progress Notes (Signed)
Subjective: S/P renal transplant in 2005 presented 4/19 with fever, hypotension (acute on chronic) R flank pain and N/V. Admitted with dx of presumed severe sepsis possibly due to native kidney pyelonephritis. Transfer out of ICU on 4/22  C/o swelling all over And thrush   Objective: Vital signs in last 24 hours: Filed Vitals:   05/25/11 1400 05/25/11 1800 05/25/11 2047 05/26/11 0526  BP: 98/68 99/60 99/66  96/68  Pulse: 62 51 50 50  Temp: 97.8 F (36.6 C) 97.8 F (36.6 C) 98.5 F (36.9 C) 98.3 F (36.8 C)  TempSrc: Oral Oral Oral Oral  Resp: 18 18 18 18   Height:   4\' 11"  (1.499 m)   Weight:   61.9 kg (136 lb 7.4 oz)   SpO2: 96% 96% 96% 99%   Weight change:   Intake/Output Summary (Last 24 hours) at 05/26/11 0856 Last data filed at 05/26/11 0656  Gross per 24 hour  Intake 2181.66 ml  Output    900 ml  Net 1281.66 ml    Physical Exam: General: Awake, Oriented, No acute distress. HEENT: EOMI. Neck: Supple CV: S1 and S2 Lungs: Clear to ascultation bilaterally Abdomen: Soft, Nontender, Nondistended, +bowel sounds. Ext: Good pulses. Trace edema.   Lab Results:  Basename 05/25/11 0830 05/24/11 0455  NA 133* 136  K 4.3 4.6  CL 102 105  CO2 18* 19  GLUCOSE 100* 103*  BUN 69* 52*  CREATININE 4.15* 3.57*  CALCIUM 9.4 9.1  MG -- --  PHOS -- 4.3    Basename 05/25/11 0830 05/24/11 0455  AST 33 --  ALT 16 --  ALKPHOS 131* --  BILITOT 0.2* --  PROT 6.3 --  ALBUMIN 3.0* 2.9*   No results found for this basename: LIPASE:2,AMYLASE:2 in the last 72 hours  Basename 05/25/11 0830 05/24/11 0455  WBC 7.7 12.3*  NEUTROABS -- --  HGB 8.5* 8.5*  HCT 25.3* 25.0*  MCV 92.0 91.6  PLT 142* 131*   No results found for this basename: CKTOTAL:3,CKMB:3,CKMBINDEX:3,TROPONINI:3 in the last 72 hours No components found with this basename: POCBNP:3 No results found for this basename: DDIMER:2 in the last 72 hours No results found for this basename: HGBA1C:2 in the last 72  hours No results found for this basename: CHOL:2,HDL:2,LDLCALC:2,TRIG:2,CHOLHDL:2,LDLDIRECT:2 in the last 72 hours No results found for this basename: TSH,T4TOTAL,FREET3,T3FREE,THYROIDAB in the last 72 hours No results found for this basename: VITAMINB12:2,FOLATE:2,FERRITIN:2,TIBC:2,IRON:2,RETICCTPCT:2 in the last 72 hours  Micro Results: Recent Results (from the past 240 hour(s))  CULTURE, BLOOD (ROUTINE X 2)     Status: Normal   Collection Time   05/22/11 12:00 PM      Component Value Range Status Comment   Specimen Description BLOOD LEFT ARM   Final    Special Requests BOTTLES DRAWN AEROBIC ONLY 1.5CC   Final    Culture  Setup Time [id number]   Final    Culture     Final    Value: ESCHERICHIA COLI     Note: Gram Stain Report Called to,Read Back By and Verified With: THERESA CRITE @0735  ON 05/23/11 BY MCLET   Report Status 05/25/2011 FINAL   Final    Organism ID, Bacteria ESCHERICHIA COLI   Final   CULTURE, BLOOD (ROUTINE X 2)     Status: Normal   Collection Time   05/22/11 12:18 PM      Component Value Range Status Comment   Specimen Description BLOOD LEFT HAND   Final    Special Requests BOTTLES DRAWN AEROBIC ONLY  [facility]   Final    Culture  Setup Time [id number]   Final    Culture     Final    Value: ESCHERICHIA COLI     Note: SUSCEPTIBILITIES PERFORMED ON PREVIOUS CULTURE WITHIN THE LAST 5 DAYS.     Note: Gram Stain Report Called to,Read Back By and Verified With: RN SABO ON 05/23/11 AT 1142 BY TEDAR   Report Status 05/25/2011 FINAL   Final   URINE CULTURE     Status: Normal   Collection Time   05/22/11  1:23 PM      Component Value Range Status Comment   Specimen Description URINE, RANDOM   Final    Special Requests NONE   Final    Culture  Setup Time [id number]   Final    Colony Count 80,000 COLONIES/ML   Final    Culture ESCHERICHIA COLI   Final    Report Status 05/24/2011 FINAL   Final    Organism ID, Bacteria ESCHERICHIA COLI   Final   MRSA PCR SCREENING     Status:  Normal   Collection Time   05/22/11  7:21 PM      Component Value Range Status Comment   MRSA by PCR NEGATIVE  NEGATIVE  Final   URINE CULTURE     Status: Normal   Collection Time   05/23/11  1:16 PM      Component Value Range Status Comment   Specimen Description URINE, CLEAN CATCH   Final    Special Requests Immunocompromised   Final    Culture  Setup Time [id number]   Final    Colony Count 5,000 COLONIES/ML   Final    Culture INSIGNIFICANT GROWTH   Final    Report Status 05/24/2011 FINAL   Final     Studies/Results: US Renal Transplant W/doppler  05/24/2011  *RADIOLOGY REPORT*  Clinical Data:  Acute renal failure and transplant patient with tenderness over the right lower quadrant allograft.  Evaluate for possible rejection  ULTRASOUND OF RENAL TRANSPLANT  Technique: Ultrasound examination of the renal transplant was performed, with color and duplex Doppler evaluation.  Comparison:  Renal ultrasound - 08/24/2008  Findings:  Transplant kidney location:  Right lower quadrant  Transplant kidney description:  The right lower quadrant transplant kidney is unchanged in size measuring approximately 12.4 cm in length.  Unchanged cortical echogenicity and thickness.  No hydronephrosis.  There is a very minimal amount of fluid adjacent to the superior pole of the right kidney (image 51).  Color flow in the main renal artery at the hilum:  Present  Color flow in the main renal vein at the hilum:  Present  Resistive indices:       Main renal artery:  0.84       Upper pole segmental renal artery: 0.69       Lower pole segmental renal artery:  0.64  Bladder:  Normal for degree of bladder distention.  IMPRESSION: 1.  Grossly unchanged appearance of right lower quadrant transplant kidney.  No evidence of hydronephrosis. 2.  There is a very minimal amount of fluid adjacent to the superior pole right kidney.  No drainable fluid collections. 3.  No definite evidence of rejection with borderline elevated resistive  indices within the main renal artery but normal resistive indices within the upper and lower poles of the transplant kidney.  Original Report Authenticated By: Rachel Moulds, M.D.    Medications: I have reviewed the patient's current medications. Scheduled  Meds:    . alteplase  2 mg Intracatheter Once  . alteplase  2 mg Intracatheter Once  . azaTHIOprine  100 mg Oral Daily  .  ceFAZolin (ANCEF) IV  1 g Intravenous Q12H  . darbepoetin  100 mcg Subcutaneous Q14 Days  . heparin  5,000 Units Subcutaneous Q8H  . metoCLOPramide (REGLAN) injection  5 mg Intravenous TID AC  . nystatin  10 mL Oral TID AC & HS  . polyethylene glycol  17 g Oral Daily  . predniSONE  5 mg Oral Daily  . sodium chloride  10-40 mL Intracatheter Q12H  . tacrolimus  2 mg Oral QHS  . tacrolimus  3 mg Oral Daily  . DISCONTD: hydrocortisone sod succinate (SOLU-CORTEF) injection  50 mg Intravenous Q8H  . DISCONTD: hydrocortisone sod succinate (SOLU-CORTEF) injection  50 mg Intravenous Q12H   Continuous Infusions:    . DISCONTD: sodium chloride 50 mL/hr at 05/25/11 2246   PRN Meds:.acetaminophen, acetaminophen, ALPRAZolam, diphenhydrAMINE, guaifenesin, HYDROmorphone (DILAUDID) injection, ondansetron, sodium chloride  Assessment/Plan: 1.  AKI/CKD- likely due to ischemic ATN in setting of volume depletion/hypotension and sepsis. Now she appears euvolemic/vol overloaded. Will give dose of Lasix 40mg  IV again and follow UOP.management per nephrology 2.  E. Coli Urosepsis/septic shock- blood and urine cultures + for E. Coli, ancef, off pressors, d/c stress dose steroids, de-escalate to PO ceftin 500mg  bid for 14 total days of abx 3. S/p LRD kidney transplant with allograft nephropathy. Continue with prograf 3mg  qam 2mg  qpm and immuran 100mg  qam. 4. Anemia of chronic disease- cont with epo. Last Hgb 8.3 (missed last 2 doses of epo and normal hgb likely due to hemeconcentration from volume depletion 5. Anxiety- will add xanax  0.25mg  bid prn 6. gastroparesis- follow closely, zofran/reglan 7. Volume depletion- now with some edema, will change IVF to kvo 8.  Thrush- nystatin  ?D/C tomm Patient walked to the cafeteria on her own yesterday- no need for PT   LOS: 4 days  Shagun Wordell, DO 05/26/2011, 8:56 AM

## 2011-05-27 LAB — RENAL FUNCTION PANEL
Albumin: 2.8 g/dL — ABNORMAL LOW (ref 3.5–5.2)
Albumin: 3.1 g/dL — ABNORMAL LOW (ref 3.5–5.2)
BUN: 54 mg/dL — ABNORMAL HIGH (ref 6–23)
BUN: 41 mg/dL — ABNORMAL HIGH (ref 6–23)
CO2: 19 mEq/L (ref 19–32)
CO2: 20 mEq/L (ref 19–32)
Calcium: 9.5 mg/dL (ref 8.4–10.5)
Calcium: 9.2 mg/dL (ref 8.4–10.5)
Chloride: 106 mEq/L (ref 96–112)
Chloride: 107 mEq/L (ref 96–112)
Creatinine, Ser: 2.68 mg/dL — ABNORMAL HIGH (ref 0.50–1.10)
Creatinine, Ser: 2.32 mg/dL — ABNORMAL HIGH (ref 0.50–1.10)
GFR calc Af Amer: 24 mL/min — ABNORMAL LOW (ref >90)
GFR calc Af Amer: 28 mL/min — ABNORMAL LOW (ref >90)
GFR calc non Af Amer: 21 mL/min — ABNORMAL LOW (ref >90)
GFR calc non Af Amer: 25 mL/min — ABNORMAL LOW (ref >90)
Glucose, Bld: 75 mg/dL (ref 70–99)
Glucose, Bld: 103 mg/dL — ABNORMAL HIGH (ref 70–99)
Phosphorus: 3.1 mg/dL (ref 2.3–4.6)
Phosphorus: 3 mg/dL (ref 2.3–4.6)
Potassium: 3.8 mEq/L (ref 3.5–5.1)
Potassium: 4.2 mEq/L (ref 3.5–5.1)
Sodium: 137 mEq/L (ref 135–145)
Sodium: 138 mEq/L (ref 135–145)

## 2011-05-27 LAB — CBC
HCT: 25 % — ABNORMAL LOW (ref 36.0–46.0)
Hemoglobin: 8.5 g/dL — ABNORMAL LOW (ref 12.0–15.0)
MCH: 31 pg (ref 26.0–34.0)
MCHC: 34 g/dL (ref 30.0–36.0)
MCV: 91.2 fL (ref 78.0–100.0)
Platelets: 170 10*3/uL (ref 150–400)
RBC: 2.74 MIL/uL — ABNORMAL LOW (ref 3.87–5.11)
RDW: 19.7 % — ABNORMAL HIGH (ref 11.5–15.5)
WBC: 5.1 10*3/uL (ref 4.0–10.5)

## 2011-05-27 MED ORDER — COLCHICINE 0.6 MG PO TABS
0.6000 mg | ORAL_TABLET | Freq: Every day | ORAL | Status: DC
  Administered 2011-05-27 – 2011-05-30 (×4): 0.6 mg via ORAL
  Filled 2011-05-27 (×5): qty 1

## 2011-05-27 MED ORDER — PREDNISONE 20 MG PO TABS
40.0000 mg | ORAL_TABLET | Freq: Once | ORAL | Status: AC
  Administered 2011-05-27: 40 mg via ORAL
  Filled 2011-05-27 (×2): qty 2

## 2011-05-27 MED ORDER — PREDNISONE 20 MG PO TABS
40.0000 mg | ORAL_TABLET | Freq: Every day | ORAL | Status: DC
  Administered 2011-05-28 – 2011-05-30 (×3): 40 mg via ORAL
  Filled 2011-05-27 (×5): qty 2

## 2011-05-27 MED ORDER — OXYCODONE HCL 5 MG PO TABS
5.0000 mg | ORAL_TABLET | Freq: Four times a day (QID) | ORAL | Status: DC
  Administered 2011-05-27 – 2011-05-29 (×8): 5 mg via ORAL
  Filled 2011-05-27 (×8): qty 1

## 2011-05-27 NOTE — Progress Notes (Signed)
Patient asking when they will do her doppler of her legs,still c/o pain on left leg.RN called vascular but office closed for the day.Patient requests to talk to MD,text paged K. Kirby,NP. Shaneka Efaw Joselita,RN

## 2011-05-27 NOTE — Progress Notes (Signed)
[facility] ROUNDING NOTE   Subjective:   Interval History: none., has no complaint of gout  Objective:  Vital signs in last 24 hours:  Temp:  [97.3 F (36.3 C)-99.2 F (37.3 C)] 98.8 F (37.1 C) (04/24 0516) Pulse Rate:  [56-72] 65  (04/24 0516) Resp:  [18-20] 18  (04/24 0516) BP: (91-114)/(53-75) 114/75 mmHg (04/24 0516) SpO2:  [88 %-99 %] 88 % (04/24 0516) Weight:  [63.5 kg (139 lb 15.9 oz)] 63.5 kg (139 lb 15.9 oz) (04/23 2155)  Weight change: 1.6 kg (3 lb 8.4 oz) Filed Weights   05/24/11 0300 05/25/11 2047 05/26/11 2155  Weight: 61.9 kg (136 lb 7.4 oz) 61.9 kg (136 lb 7.4 oz) 63.5 kg (139 lb 15.9 oz)    Intake/Output: I/O last 3 completed shifts: In: 2061.7 [P.O.:1440; I.V.:571.7; IV Piggyback:50] Out: [id number] [Urine:3950; Emesis/NG output:1]   Intake/Output this shift:     CVS- RRR RS- CTA ABD- BS present soft non-distended EXT- no edema   Basic Metabolic Panel:  Lab [id number] 0540 05/26/11 1033 05/25/11 0830 05/24/11 0455 05/23/11 0430  NA 137 136 133* 136 135  K 3.8 3.7 4.3 4.6 4.2  CL 106 106 102 105 105  CO2 19 19 18* 19 19  GLUCOSE 75 96 100* 103* 144*  BUN 54* 66* 69* 52* 40*  CREATININE 2.68* 3.50* 4.15* 3.57* 3.10*  CALCIUM 9.5 9.5 9.4 -- --  MG -- -- -- -- --  PHOS 3.1 -- -- 4.3 --    Liver Function Tests:  Lab 05/27/11 0540 05/25/11 0830 05/24/11 0455 05/22/11 1238  AST -- 33 -- 30  ALT -- 16 -- 11  ALKPHOS -- 131* -- 75  BILITOT -- 0.2* -- 1.3*  PROT -- 6.3 -- 6.7  ALBUMIN 2.8* 3.0* 2.9* 3.5    Lab 05/22/11 1238  LIPASE 20  AMYLASE --   No results found for this basename: AMMONIA:3 in the last 168 hours  CBC:  Lab 05/27/11 0540 05/25/11 0830 05/24/11 0455 05/23/11 0430 05/22/11 1238  WBC 5.1 7.7 12.3* 12.9* 10.0  NEUTROABS -- -- -- -- 9.2*  HGB 8.5* 8.5* 8.5* 9.3* 11.0*  HCT 25.0* 25.3* 25.0* 27.2* 31.8*  MCV 91.2 92.0 91.6 93.2 91.9  PLT 170 142* 131* 126* PLATELET CLUMPS NOTED ON SMEAR, UNABLE TO ESTIMATE     Cardiac Enzymes:  Lab 05/23/11 0800 05/22/11 2319 05/22/11 1635  CKTOTAL 58 87 81  CKMB 1.9 2.0 1.8  CKMBINDEX -- -- --  TROPONINI <0.30 <0.30 <0.30    BNP: No components found with this basename: POCBNP:5  CBG:  Lab 05/22/11 1952  GLUCAP 119*    Microbiology: Results for orders placed during the hospital encounter of 05/22/11  CULTURE, BLOOD (ROUTINE X 2)     Status: Normal   Collection Time   05/22/11 12:00 PM      Component Value Range Status Comment   Specimen Description BLOOD LEFT ARM   Final    Special Requests BOTTLES DRAWN AEROBIC ONLY 1.5CC   Final    Culture  Setup Time [id number]   Final    Culture     Final    Value: ESCHERICHIA COLI     Note: Gram Stain Report Called to,Read Back By and Verified With: THERESA CRITE @0735  ON 05/23/11 BY MCLET   Report Status 05/25/2011 FINAL   Final    Organism ID, Bacteria ESCHERICHIA COLI   Final   CULTURE, BLOOD (ROUTINE X 2)     Status: Normal  Collection Time   05/22/11 12:18 PM      Component Value Range Status Comment   Specimen Description BLOOD LEFT HAND   Final    Special Requests BOTTLES DRAWN AEROBIC ONLY [facility]   Final    Culture  Setup Time [id number]   Final    Culture     Final    Value: ESCHERICHIA COLI     Note: SUSCEPTIBILITIES PERFORMED ON PREVIOUS CULTURE WITHIN THE LAST 5 DAYS.     Note: Gram Stain Report Called to,Read Back By and Verified With: RN SABO ON 05/23/11 AT 1142 BY TEDAR   Report Status 05/25/2011 FINAL   Final   URINE CULTURE     Status: Normal   Collection Time   05/22/11  1:23 PM      Component Value Range Status Comment   Specimen Description URINE, RANDOM   Final    Special Requests NONE   Final    Culture  Setup Time [id number]   Final    Colony Count 80,000 COLONIES/ML   Final    Culture ESCHERICHIA COLI   Final    Report Status 05/24/2011 FINAL   Final    Organism ID, Bacteria ESCHERICHIA COLI   Final   MRSA PCR SCREENING     Status: Normal   Collection Time    05/22/11  7:21 PM      Component Value Range Status Comment   MRSA by PCR NEGATIVE  NEGATIVE  Final   URINE CULTURE     Status: Normal   Collection Time   05/23/11  1:16 PM      Component Value Range Status Comment   Specimen Description URINE, CLEAN CATCH   Final    Special Requests Immunocompromised   Final    Culture  Setup Time [id number]   Final    Colony Count 5,000 COLONIES/ML   Final    Culture INSIGNIFICANT GROWTH   Final    Report Status 05/24/2011 FINAL   Final     Coagulation Studies: No results found for this basename: LABPROT:5,INR:5 in the last 72 hours  Urinalysis: No results found for this basename: COLORURINE:2,APPERANCEUR:2,LABSPEC:2,PHURINE:2,GLUCOSEU:2,HGBUR:2,BILIRUBINUR:2,KETONESUR:2,PROTEINUR:2,UROBILINOGEN:2,NITRITE:2,LEUKOCYTESUR:2 in the last 72 hours    Imaging: No results found.   Medications:        . alteplase  2 mg Intracatheter Once  . alteplase  2 mg Intracatheter Once  . azaTHIOprine  100 mg Oral Daily  . cefUROXime  500 mg Oral BID WC  . colchicine  0.6 mg Oral Daily  . darbepoetin  100 mcg Subcutaneous Q14 Days  . furosemide  40 mg Intravenous Once  . heparin  5,000 Units Subcutaneous Q8H  . metoCLOPramide (REGLAN) injection  5 mg Intravenous TID AC  . nystatin  10 mL Oral TID AC & HS  . polyethylene glycol  17 g Oral Daily  . predniSONE  5 mg Oral Daily  . sodium chloride  10-40 mL Intracatheter Q12H  . tacrolimus  2 mg Oral QHS  . tacrolimus  3 mg Oral Daily  . DISCONTD:  ceFAZolin (ANCEF) IV  1 g Intravenous Q12H   acetaminophen, acetaminophen, ALPRAZolam, diphenhydrAMINE, guaifenesin, HYDROmorphone (DILAUDID) injection, ondansetron, sodium chloride  Assessment/ Plan:  ESRD-s/p transplant x 3 Acute renal failure in setting of E Coli sepsis  ANEMIA-last HB 8.5  MBD-stable  HTN/VOL-controlled Blood pressure soft    Patient recovering. Start colchicine for gout   LOS: 5 Tina Watkins W @TODAY @8 :46 AM

## 2011-05-27 NOTE — Progress Notes (Signed)
Subjective: Complains of left leg pain.  Objective: Vital signs in last 24 hours: Temp:  [97.3 F (36.3 C)-99.2 F (37.3 C)] 98.5 F (36.9 C) (04/24 1400) Pulse Rate:  [56-68] 68  (04/24 1400) Resp:  [18-20] 18  (04/24 1400) BP: (91-129)/(53-75) 129/74 mmHg (04/24 1400) SpO2:  [88 %-99 %] 98 % (04/24 1400) Weight:  [63.5 kg (139 lb 15.9 oz)] 63.5 kg (139 lb 15.9 oz) (04/23 2155) Weight change: 1.6 kg (3 lb 8.4 oz) Last BM Date: 05/27/11  Intake/Output from previous day: 04/23 0701 - 04/24 0700 In: 41 [P.O.:720] Out: 3151 [Urine:3150; Emesis/NG output:1] Total I/O In: 12 [P.O.:960] Out: 1101 [Urine:1100; Emesis/NG output:1]   Physical Exam: General: In some discomfort from leg pain, alert, communicative, fully oriented, not short of breath at rest.  HEENT:  Mild clinical pallor, no jaundice, no conjunctival injection or discharge. Hydration status appears fair. NECK:  Supple, JVP not seen, no carotid bruits, no palpable lymphadenopathy, no palpable goiter. CHEST:  Clinically clear to auscultation, no wheezes, no crackles. HEART:  Sounds 1 and 2 heard, normal, regular, no murmurs. ABDOMEN:  Full, soft, non-tender, no palpable organomegaly, no palpable masses, normal bowel sounds. GENITALIA:  Not examined. LOWER EXTREMITIES:  No pitting edema, has prominent varicosities, both LE palpable peripheral pulses. MUSCULOSKELETAL SYSTEM:  Has mild left knee effusion and tenderness. CENTRAL NERVOUS SYSTEM:  No focal neurologic deficit on gross examination.  Lab Results:  Basename 05/27/11 0540 05/25/11 0830  WBC 5.1 7.7  HGB 8.5* 8.5*  HCT 25.0* 25.3*  PLT 170 142*    Basename 05/27/11 0540 05/26/11 1033  NA 137 136  K 3.8 3.7  CL 106 106  CO2 19 19  GLUCOSE 75 96  BUN 54* 66*  CREATININE 2.68* 3.50*  CALCIUM 9.5 9.5   Recent Results (from the past 240 hour(s))  CULTURE, BLOOD (ROUTINE X 2)     Status: Normal   Collection Time   05/22/11 12:00 PM      Component Value  Range Status Comment   Specimen Description BLOOD LEFT ARM   Final    Special Requests BOTTLES DRAWN AEROBIC ONLY 1.5CC   Final    Culture  Setup Time [id number]   Final    Culture     Final    Value: ESCHERICHIA COLI     Note: Gram Stain Report Called to,Read Back By and Verified With: THERESA CRITE @0735  ON 05/23/11 BY MCLET   Report Status 05/25/2011 FINAL   Final    Organism ID, Bacteria ESCHERICHIA COLI   Final   CULTURE, BLOOD (ROUTINE X 2)     Status: Normal   Collection Time   05/22/11 12:18 PM      Component Value Range Status Comment   Specimen Description BLOOD LEFT HAND   Final    Special Requests BOTTLES DRAWN AEROBIC ONLY [facility]   Final    Culture  Setup Time [id number]   Final    Culture     Final    Value: ESCHERICHIA COLI     Note: SUSCEPTIBILITIES PERFORMED ON PREVIOUS CULTURE WITHIN THE LAST 5 DAYS.     Note: Gram Stain Report Called to,Read Back By and Verified With: RN SABO ON 05/23/11 AT [id number] BY TEDAR   Report Status 05/25/2011 FINAL   Final   URINE CULTURE     Status: Normal   Collection Time   05/22/11  1:23 PM      Component Value Range Status Comment   Specimen  Description URINE, RANDOM   Final    Special Requests NONE   Final    Culture  Setup Time [id number]   Final    Colony Count 80,000 COLONIES/ML   Final    Culture ESCHERICHIA COLI   Final    Report Status 05/24/2011 FINAL   Final    Organism ID, Bacteria ESCHERICHIA COLI   Final   MRSA PCR SCREENING     Status: Normal   Collection Time   05/22/11  7:21 PM      Component Value Range Status Comment   MRSA by PCR NEGATIVE  NEGATIVE  Final   URINE CULTURE     Status: Normal   Collection Time   05/23/11  1:16 PM      Component Value Range Status Comment   Specimen Description URINE, CLEAN CATCH   Final    Special Requests Immunocompromised   Final    Culture  Setup Time [id number]   Final    Colony Count 5,000 COLONIES/ML   Final    Culture INSIGNIFICANT GROWTH   Final    Report Status  05/24/2011 FINAL   Final      Studies/Results: No results found.  Medications: Scheduled Meds:   . alteplase  2 mg Intracatheter Once  . alteplase  2 mg Intracatheter Once  . azaTHIOprine  100 mg Oral Daily  . cefUROXime  500 mg Oral BID WC  . colchicine  0.6 mg Oral Daily  . darbepoetin  100 mcg Subcutaneous Q14 Days  . heparin  5,000 Units Subcutaneous Q8H  . metoCLOPramide (REGLAN) injection  5 mg Intravenous TID AC  . nystatin  10 mL Oral TID AC & HS  . oxyCODONE  5 mg Oral QID  . polyethylene glycol  17 g Oral Daily  . predniSONE  5 mg Oral Daily  . sodium chloride  10-40 mL Intracatheter Q12H  . tacrolimus  2 mg Oral QHS  . tacrolimus  3 mg Oral Daily   Continuous Infusions:  PRN Meds:.acetaminophen, acetaminophen, ALPRAZolam, diphenhydrAMINE, guaifenesin, HYDROmorphone (DILAUDID) injection, ondansetron, sodium chloride  Assessment/Plan: 1. AKI/CKD Likely due to ischemic ATN, in setting of volume depletion/hypotension and sepsis. Now clinically euvolemic and stable. Managing per renal. 2. E. Coli Urosepsis/septic shock: Patient presented with hypotension, a pyrexia of 102, and borderline wcc, suggestive of SIRS. She was admitted initially to ICU, under care of PCCM, and managed with broad spectrum antibiotics, volume resuscitation and pressors. Blood and urine cultures subsequently demonstrated E. Coli. Clinically, sepsis has now resolved, and patient was successfully weaned of pressors and iv steroids, without deleterious effect. She is now on PO ceftin 500mg  bid and is on day # 6 of 14 total days of abx. 3. S/p LRD kidney transplant with allograft nephropathy. Continued on Prograf 3mg  qam 2mg  qpm and immuran 100mg  qam.  4. Anemia of chronic disease: Patient is on Epoetin. Last Hgb 8.3 (missed last 2 doses of epo and normal hgb likely due to hemeconcentration from volume depletion.  5. Anxiety: Stable on Xanax 0.25mg  bid prn, started on 05/26/11.  6. Gastroparesis:  Asymptomatic on Zofran/Reglan  7. Thrush: On Nystatin. Left Knee/Leg pain. Patient complains of left knee and leg pain, and clinically has mild left knee effusion, swelling and pain. She has a known history of Gout, and is chronically on Colchicine, which was re-started today. We have commenced scheduled analgesics, requested venous doppler to rule out DVT, and have increased Prednisone to 40 mg PO daily, for possible acute  gout.  Comment: Aiming discharge next few days.   LOS: 5 days   Ariyon Mittleman,CHRISTOPHER 05/27/2011, 4:06 PM

## 2011-05-28 DIAGNOSIS — M79609 Pain in unspecified limb: Secondary | ICD-10-CM

## 2011-05-28 DIAGNOSIS — M7989 Other specified soft tissue disorders: Secondary | ICD-10-CM

## 2011-05-28 LAB — RENAL FUNCTION PANEL
Albumin: 3.1 g/dL — ABNORMAL LOW (ref 3.5–5.2)
BUN: 37 mg/dL — ABNORMAL HIGH (ref 6–23)
CO2: 20 mEq/L (ref 19–32)
Calcium: 9.2 mg/dL (ref 8.4–10.5)
Chloride: 107 mEq/L (ref 96–112)
Creatinine, Ser: 2.19 mg/dL — ABNORMAL HIGH (ref 0.50–1.10)
GFR calc Af Amer: 31 mL/min — ABNORMAL LOW (ref >90)
GFR calc non Af Amer: 26 mL/min — ABNORMAL LOW (ref >90)
Glucose, Bld: 108 mg/dL — ABNORMAL HIGH (ref 70–99)
Phosphorus: 3.8 mg/dL (ref 2.3–4.6)
Potassium: 4.7 mEq/L (ref 3.5–5.1)
Sodium: 136 mEq/L (ref 135–145)

## 2011-05-28 LAB — CBC
HCT: 23.6 % — ABNORMAL LOW (ref 36.0–46.0)
Hemoglobin: 7.9 g/dL — ABNORMAL LOW (ref 12.0–15.0)
MCH: 30.9 pg (ref 26.0–34.0)
MCHC: 33.5 g/dL (ref 30.0–36.0)
MCV: 92.2 fL (ref 78.0–100.0)
Platelets: 208 10*3/uL (ref 150–400)
RBC: 2.56 MIL/uL — ABNORMAL LOW (ref 3.87–5.11)
RDW: 19.9 % — ABNORMAL HIGH (ref 11.5–15.5)
WBC: 5.2 10*3/uL (ref 4.0–10.5)

## 2011-05-28 MED ORDER — ONDANSETRON HCL 4 MG/2ML IJ SOLN
4.0000 mg | Freq: Four times a day (QID) | INTRAMUSCULAR | Status: DC | PRN
  Administered 2011-05-28 – 2011-05-30 (×4): 4 mg via INTRAVENOUS
  Filled 2011-05-28 (×5): qty 2

## 2011-05-28 MED ORDER — ONDANSETRON HCL 4 MG PO TABS: 4.0000 mg | ORAL_TABLET | Freq: Four times a day (QID) | ORAL | Status: DC | PRN

## 2011-05-28 NOTE — Progress Notes (Signed)
Bilateral lower extremity venous duplex completed.  Preliminary report is negative for DVT, SVT, or a Baker's cyst.  Left common femoral vein not visualized due to tape around line.

## 2011-05-28 NOTE — Progress Notes (Signed)
Subjective: Left leg pain has resolved. Left knee pain is much less.  Objective: Vital signs in last 24 hours: Temp:  [98.1 F (36.7 C)-99 F (37.2 C)] 98.5 F (36.9 C) (04/25 0929) Pulse Rate:  [57-61] 57  (04/25 0929) Resp:  [18] 18  (04/25 0929) BP: (96-108)/(50-64) 98/63 mmHg (04/25 0929) SpO2:  [95 %-100 %] 100 % (04/25 0929) Weight:  [63.5 kg (139 lb 15.9 oz)] 63.5 kg (139 lb 15.9 oz) (04/24 2018) Weight change: 0 kg (0 lb) Last BM Date: 05/27/11  Intake/Output from previous day: 04/24 0701 - 04/25 0700 In: 2082 [P.O.:2082] Out: 2251 [Urine:2250; Emesis/NG output:1] Total I/O In: 240 [P.O.:240] Out: -    Physical Exam: General: Comfortable, alert, communicative, fully oriented, not short of breath at rest.  HEENT:  Mild clinical pallor, no jaundice, no conjunctival injection or discharge. Hydration status appears fair. NECK:  Supple, JVP not seen, no carotid bruits, no palpable lymphadenopathy, no palpable goiter. CHEST:  Clinically clear to auscultation, no wheezes, no crackles. HEART:  Sounds 1 and 2 heard, normal, regular, no murmurs. ABDOMEN:  Full, soft, non-tender, no palpable organomegaly, no palpable masses, normal bowel sounds. GENITALIA:  Not examined. LOWER EXTREMITIES:  No pitting edema, has prominent varicosities, both LE palpable peripheral pulses. MUSCULOSKELETAL SYSTEM:  Has mild left knee effusion, no tenderness, improved ROM. CENTRAL NERVOUS SYSTEM:  No focal neurologic deficit on gross examination.  Lab Results:  [facility] 05/28/11 0620 05/27/11 0540  WBC 5.2 5.1  HGB 7.9* 8.5*  HCT 23.6* 25.0*  PLT 208 170    Basename 05/28/11 0620 05/27/11 1900  NA 136 138  K 4.7 4.2  CL 107 107  CO2 20 20  GLUCOSE 108* 103*  BUN 37* 41*  CREATININE 2.19* 2.32*  CALCIUM 9.2 9.2   Recent Results (from the past 240 hour(s))  CULTURE, BLOOD (ROUTINE X 2)     Status: Normal   Collection Time   05/22/11 12:00 PM      Component Value Range Status Comment    Specimen Description BLOOD LEFT ARM   Final    Special Requests BOTTLES DRAWN AEROBIC ONLY 1.5CC   Final    Culture  Setup Time [id number]   Final    Culture     Final    Value: ESCHERICHIA COLI     Note: Gram Stain Report Called to,Read Back By and Verified With: THERESA CRITE @0735  ON 05/23/11 BY MCLET   Report Status 05/25/2011 FINAL   Final    Organism ID, Bacteria ESCHERICHIA COLI   Final   CULTURE, BLOOD (ROUTINE X 2)     Status: Normal   Collection Time   05/22/11 12:18 PM      Component Value Range Status Comment   Specimen Description BLOOD LEFT HAND   Final    Special Requests BOTTLES DRAWN AEROBIC ONLY [facility]   Final    Culture  Setup Time [id number]   Final    Culture     Final    Value: ESCHERICHIA COLI     Note: SUSCEPTIBILITIES PERFORMED ON PREVIOUS CULTURE WITHIN THE LAST 5 DAYS.     Note: Gram Stain Report Called to,Read Back By and Verified With: RN SABO ON 05/23/11 AT 1142 BY TEDAR   Report Status 05/25/2011 FINAL   Final   URINE CULTURE     Status: Normal   Collection Time   05/22/11  1:23 PM      Component Value Range Status Comment   Specimen Description  URINE, RANDOM   Final    Special Requests NONE   Final    Culture  Setup Time [id number]   Final    Colony Count 80,000 COLONIES/ML   Final    Culture ESCHERICHIA COLI   Final    Report Status 05/24/2011 FINAL   Final    Organism ID, Bacteria ESCHERICHIA COLI   Final   MRSA PCR SCREENING     Status: Normal   Collection Time   05/22/11  7:21 PM      Component Value Range Status Comment   MRSA by PCR NEGATIVE  NEGATIVE  Final   URINE CULTURE     Status: Normal   Collection Time   05/23/11  1:16 PM      Component Value Range Status Comment   Specimen Description URINE, CLEAN CATCH   Final    Special Requests Immunocompromised   Final    Culture  Setup Time [id number]   Final    Colony Count 5,000 COLONIES/ML   Final    Culture INSIGNIFICANT GROWTH   Final    Report Status 05/24/2011 FINAL   Final       Studies/Results: No results found.  Medications: Scheduled Meds:    . azaTHIOprine  100 mg Oral Daily  . cefUROXime  500 mg Oral BID WC  . colchicine  0.6 mg Oral Daily  . darbepoetin  100 mcg Subcutaneous Q14 Days  . heparin  5,000 Units Subcutaneous Q8H  . metoCLOPramide (REGLAN) injection  5 mg Intravenous TID AC  . nystatin  10 mL Oral TID AC & HS  . oxyCODONE  5 mg Oral QID  . polyethylene glycol  17 g Oral Daily  . predniSONE  40 mg Oral Once  . predniSONE  40 mg Oral Q breakfast  . sodium chloride  10-40 mL Intracatheter Q12H  . tacrolimus  2 mg Oral QHS  . tacrolimus  3 mg Oral Daily  . DISCONTD: predniSONE  5 mg Oral Daily   Continuous Infusions:  PRN Meds:.acetaminophen, acetaminophen, ALPRAZolam, diphenhydrAMINE, guaifenesin, HYDROmorphone (DILAUDID) injection, ondansetron, ondansetron (ZOFRAN) IV, ondansetron, sodium chloride  Assessment/Plan: 1. AKI/CKD Likely due to ischemic ATN, in setting of volume depletion/hypotension and sepsis. Now clinically euvolemic and stable. Managing per renal. 2. E. Coli Urosepsis/septic shock: Patient presented with hypotension, a pyrexia of 102, and borderline wcc, suggestive of SIRS. She was admitted initially to ICU, under care of PCCM, and managed with broad spectrum antibiotics, volume resuscitation and pressors. Blood and urine cultures subsequently demonstrated E. Coli. Clinically, sepsis has now resolved, and patient was successfully weaned of pressors and iv steroids, without deleterious effect. She is now on PO ceftin 500mg  bid and is on day # 7 of 14 total days of abx. 3. S/p LRD kidney transplant with allograft nephropathy. Continued on Prograf 3mg  qam 2mg  qpm and immuran 100mg  qam.  4. Anemia of chronic disease: Patient is on Epoetin. Last Hgb 8.3 (missed last 2 doses of epo and normal hgb likely due to hemeconcentration from volume depletion.  5. Anxiety: Stable on Xanax 0.25mg  bid prn, started on 05/26/11. Not  problematic. 6. Gastroparesis: Asymptomatic on Zofran/Reglan  7. Thrush: On Nystatin. Left Knee/Leg pain. Patient complained of left knee and leg pain on 05/27/11, and clinically had mild left knee effusion, swelling and pain. She has a known history of Gout, and is chronically on Colchicine, which was re-started on 05/27/11. We have commenced scheduled analgesics, venous doppler of 05/28/11, was negative for DVTto rule  out DVT. Prednisone was increased to 40 mg PO daily, for possible acute gout. A 5-day course is planned.  Comment: Aiming discharge possibly on 05/29/11.   LOS: 6 days   Tredarius Cobern,CHRISTOPHER 05/28/2011, 2:15 PM

## 2011-05-28 NOTE — Progress Notes (Signed)
Patient to vascular lab via w/c, no acute distress noted.

## 2011-05-28 NOTE — Progress Notes (Signed)
[facility] ROUNDING NOTE   Subjective:   Interval History: none.  Objective:  Vital signs in last 24 hours:  Temp:  [98.1 F (36.7 C)-99.1 F (37.3 C)] 98.4 F (36.9 C) (04/25 0438) Pulse Rate:  [59-68] 60  (04/25 0438) Resp:  [18] 18  (04/25 0438) BP: (96-129)/(50-74) 96/59 mmHg (04/25 0438) SpO2:  [95 %-98 %] 98 % (04/25 0438) Weight:  [63.5 kg (139 lb 15.9 oz)] 63.5 kg (139 lb 15.9 oz) (04/24 2018)  Weight change: 0 kg (0 lb) Filed Weights   05/25/11 2047 05/26/11 2155 05/27/11 2018  Weight: 61.9 kg (136 lb 7.4 oz) 63.5 kg (139 lb 15.9 oz) 63.5 kg (139 lb 15.9 oz)    Intake/Output: I/O last 3 completed shifts: In: 2322 [P.O.:2322] Out: 3702 [Urine:3700; Emesis/NG output:2]   Intake/Output this shift:     CVS- RRR RS- CTA ABD- BS present soft non-distended EXT- no edema   Basic Metabolic Panel:  Lab [id number] 0620 05/27/11 1900 05/27/11 0540 05/26/11 1033 05/25/11 0830 05/24/11 0455  NA 136 138 137 136 133* --  K 4.7 4.2 3.8 3.7 4.3 --  CL 107 107 106 106 102 --  CO2 20 20 19 19  18* --  GLUCOSE 108* 103* 75 96 100* --  BUN 37* 41* 54* 66* 69* --  CREATININE 2.19* 2.32* 2.68* 3.50* 4.15* --  CALCIUM 9.2 9.2 9.5 -- -- --  MG -- -- -- -- -- --  PHOS 3.8 3.0 3.1 -- -- 4.3    Liver Function Tests:  Lab 05/28/11 0620 05/27/11 1900 05/27/11 0540 05/25/11 0830 05/24/11 0455 05/22/11 1238  AST -- -- -- 33 -- 30  ALT -- -- -- 16 -- 11  ALKPHOS -- -- -- 131* -- 75  BILITOT -- -- -- 0.2* -- 1.3*  PROT -- -- -- 6.3 -- 6.7  ALBUMIN 3.1* 3.1* 2.8* 3.0* 2.9* --    Lab 05/22/11 1238  LIPASE 20  AMYLASE --   No results found for this basename: AMMONIA:3 in the last 168 hours  CBC:  Lab 05/28/11 0620 05/27/11 0540 05/25/11 0830 05/24/11 0455 05/23/11 0430 05/22/11 1238  WBC 5.2 5.1 7.7 12.3* 12.9* --  NEUTROABS -- -- -- -- -- 9.2*  HGB 7.9* 8.5* 8.5* 8.5* 9.3* --  HCT 23.6* 25.0* 25.3* 25.0* 27.2* --  MCV 92.2 91.2 92.0 91.6 93.2 --  PLT  208 170 142* 131* 126* --    Cardiac Enzymes:  Lab 05/23/11 0800 05/22/11 2319 05/22/11 1635  CKTOTAL 58 87 81  CKMB 1.9 2.0 1.8  CKMBINDEX -- -- --  TROPONINI <0.30 <0.30 <0.30    BNP: No components found with this basename: POCBNP:5  CBG:  Lab 05/22/11 1952  GLUCAP 119*    Microbiology: Results for orders placed during the hospital encounter of 05/22/11  CULTURE, BLOOD (ROUTINE X 2)     Status: Normal   Collection Time   05/22/11 12:00 PM      Component Value Range Status Comment   Specimen Description BLOOD LEFT ARM   Final    Special Requests BOTTLES DRAWN AEROBIC ONLY 1.5CC   Final    Culture  Setup Time [id number]   Final    Culture     Final    Value: ESCHERICHIA COLI     Note: Gram Stain Report Called to,Read Back By and Verified With: THERESA CRITE @0735  ON 05/23/11 BY MCLET   Report Status 05/25/2011 FINAL   Final    Organism ID,  Bacteria ESCHERICHIA COLI   Final   CULTURE, BLOOD (ROUTINE X 2)     Status: Normal   Collection Time   05/22/11 12:18 PM      Component Value Range Status Comment   Specimen Description BLOOD LEFT HAND   Final    Special Requests BOTTLES DRAWN AEROBIC ONLY [facility]   Final    Culture  Setup Time [id number]   Final    Culture     Final    Value: ESCHERICHIA COLI     Note: SUSCEPTIBILITIES PERFORMED ON PREVIOUS CULTURE WITHIN THE LAST 5 DAYS.     Note: Gram Stain Report Called to,Read Back By and Verified With: RN SABO ON 05/23/11 AT 1142 BY TEDAR   Report Status 05/25/2011 FINAL   Final   URINE CULTURE     Status: Normal   Collection Time   05/22/11  1:23 PM      Component Value Range Status Comment   Specimen Description URINE, RANDOM   Final    Special Requests NONE   Final    Culture  Setup Time [id number]   Final    Colony Count 80,000 COLONIES/ML   Final    Culture ESCHERICHIA COLI   Final    Report Status 05/24/2011 FINAL   Final    Organism ID, Bacteria ESCHERICHIA COLI   Final   MRSA PCR SCREENING     Status: Normal    Collection Time   05/22/11  7:21 PM      Component Value Range Status Comment   MRSA by PCR NEGATIVE  NEGATIVE  Final   URINE CULTURE     Status: Normal   Collection Time   05/23/11  1:16 PM      Component Value Range Status Comment   Specimen Description URINE, CLEAN CATCH   Final    Special Requests Immunocompromised   Final    Culture  Setup Time [id number]   Final    Colony Count 5,000 COLONIES/ML   Final    Culture INSIGNIFICANT GROWTH   Final    Report Status 05/24/2011 FINAL   Final     Coagulation Studies: No results found for this basename: LABPROT:5,INR:5 in the last 72 hours  Urinalysis: No results found for this basename: COLORURINE:2,APPERANCEUR:2,LABSPEC:2,PHURINE:2,GLUCOSEU:2,HGBUR:2,BILIRUBINUR:2,KETONESUR:2,PROTEINUR:2,UROBILINOGEN:2,NITRITE:2,LEUKOCYTESUR:2 in the last 72 hours    Imaging: No results found.   Medications:        . azaTHIOprine  100 mg Oral Daily  . cefUROXime  500 mg Oral BID WC  . colchicine  0.6 mg Oral Daily  . darbepoetin  100 mcg Subcutaneous Q14 Days  . heparin  5,000 Units Subcutaneous Q8H  . metoCLOPramide (REGLAN) injection  5 mg Intravenous TID AC  . nystatin  10 mL Oral TID AC & HS  . oxyCODONE  5 mg Oral QID  . polyethylene glycol  17 g Oral Daily  . predniSONE  40 mg Oral Once  . predniSONE  40 mg Oral Q breakfast  . sodium chloride  10-40 mL Intracatheter Q12H  . tacrolimus  2 mg Oral QHS  . tacrolimus  3 mg Oral Daily  . DISCONTD: predniSONE  5 mg Oral Daily   acetaminophen, acetaminophen, ALPRAZolam, diphenhydrAMINE, guaifenesin, HYDROmorphone (DILAUDID) injection, ondansetron, sodium chloride  Assessment/ Plan:  ESRD-s/p transplant x 3 Acute renal failure in setting of E Coli sepsis  ANEMIA-last HB 8.5  MBD-stable  HTN/VOL-controlled Blood pressure soft    Patient recovering renal function after episode of sepsis and shock and hypotension  LOS: 6 Tina Watkins W @TODAY @8 :42 AM

## 2011-05-28 NOTE — Discharge Summary (Addendum)
Physician Discharge Summary  Patient ID: Tina Watkins MRN: [id number] DOB/AGE: [id number] y.o.  Admit date: 05/22/2011 Discharge date: 05/30/2011  Primary Care Physician:  [name], MD, MD   Discharge Diagnoses:    Patient Active Problem List  Diagnoses  . GASTROPARESIS  . WEIGHT LOSS  . NAUSEA AND VOMITING  . History of renal transplantation  . Septic shock  . Acute on chronic kidney failure  . Bacteremia due to Gram-negative bacteria    Medication List  As of 05/30/2011 10:50 AM   STOP taking these medications         furosemide 40 MG tablet         TAKE these medications         ALPRAZolam 0.25 MG tablet   Commonly known as: XANAX   Take 1 tablet (0.25 mg total) by mouth 2 (two) times daily as needed for anxiety.      AMBIEN 10 MG tablet   Generic drug: zolpidem   Take 10 mg by mouth at bedtime as needed. To help sleep.      aspirin EC 81 MG tablet   Take 81 mg by mouth daily.      azaTHIOprine 50 MG tablet   Commonly known as: IMURAN   Take 100 mg by mouth daily.      calcitRIOL 0.25 MCG capsule   Commonly known as: ROCALTROL   Take 0.25 mcg by mouth daily.      calcium carbonate 750 MG chewable tablet   Commonly known as: TUMS EX   Chew 1-2 tablets by mouth as needed. For stomach      cefUROXime 500 MG tablet   Commonly known as: CEFTIN   Take 1 tablet (500 mg total) by mouth 2 (two) times daily with a meal.      clotrimazole 1 % cream   Commonly known as: LOTRIMIN   Apply topically 2 (two) times daily. To affected areas of thighs.      colchicine 0.6 MG tablet   Take 1 tablet (0.6 mg total) by mouth daily.      epoetin alfa 10000 UNIT/ML injection   Commonly known as: EPOGEN,PROCRIT   Inject 40,000 Units into the skin every 30 (thirty) days.      metoCLOPramide 5 MG tablet   Commonly known as: REGLAN   Take 5 mg by mouth at bedtime.      midodrine 10 MG tablet   Commonly known as: PROAMATINE   Take 1 tablet (10 mg  total) by mouth 2 (two) times daily with a meal.      mulitivitamin with minerals Tabs   Take 1 tablet by mouth daily.      nystatin 100000 UNIT/ML suspension   Commonly known as: MYCOSTATIN   Take 10 mLs (1,000,000 Units total) by mouth 4 (four) times daily -  before meals and at bedtime.      omeprazole 40 MG capsule   Commonly known as: PRILOSEC   Take 40 mg by mouth 2 (two) times daily.      ondansetron 4 MG tablet   Commonly known as: ZOFRAN   Take 1 tablet (4 mg total) by mouth every 8 (eight) hours as needed for nausea.      Oxycodone HCl 10 MG Tabs   Take 1 tablet (10 mg total) by mouth 4 (four) times daily.      oxyCODONE-acetaminophen 10-650 MG per tablet   Commonly known as: PERCOCET   Take 1 tablet by  mouth every 6 (six) hours as needed. For pain.      predniSONE 5 MG tablet   Commonly known as: DELTASONE   Take 5 mg by mouth daily.      predniSONE 10 MG tablet   Commonly known as: DELTASONE   Take 40 mg daily for 2 days, then 30 mg daily for 3 days, then 20 mg daily for 3 days, then 10 mg daily for 3 days, then Stop. Continue with your maintenance dose of 5 mg daily, from 06/11/11.      promethazine 25 MG tablet   Commonly known as: PHENERGAN   Take 25 mg by mouth every 6 (six) hours as needed. For nausea        rosuvastatin 5 MG tablet   Commonly known as: CRESTOR   Take 5 mg by mouth daily.      tacrolimus 1 MG capsule   Commonly known as: PROGRAF   Take 2-3 mg by mouth 2 (two) times daily. 3mg  in the morning  2mg  at night             Disposition and Follow-up: Follow up with Primary MD and Nephrologist.  Consults:  nephrology  Dr. Donato Heinz.  Significant Diagnostic Studies:  Dg Chest 2 View  05/22/2011  *RADIOLOGY REPORT*  Clinical Data: 46 year old female with nausea vomiting fever, back pain, cough, tachycardia, dialysis.  CHEST - 2 VIEW  Comparison: 02/19/2011 and earlier.  Findings: Stable mediastinal metallic stent, may be  within the SVC. Stable cardiac size and mediastinal contours.  Stable lung volumes. No pneumothorax or pneumoperitoneum.  No pulmonary edema, pleural effusion or acute pulmonary opacity.  Stable epigastric surgical clips.  Paucity of bowel gas. No acute osseous abnormality identified.  IMPRESSION: No acute cardiopulmonary abnormality.  Original Report Authenticated By: Randall An, M.D.   Portable Chest Xray In Am  05/23/2011  *RADIOLOGY REPORT*  Clinical Data: Evaluate for pneumonia  PORTABLE CHEST - 1 VIEW  Comparison: 05/22/2011; 02/19/2011; chest CT - 10/24/2004  Findings: Grossly unchanged cardiac silhouette and mediastinal contours with a vascular stent overlying the expected location of the distal SVC.  Grossly unchanged bibasilar heterogeneous opacities, left greater than right.  There is persistent mild elevation of the right hemidiaphragm.  No definite pleural effusion or pneumothorax.  Grossly unchanged bones.  IMPRESSION: Grossly unchanged bibasilar heterogeneous opacities favored to represent atelectasis.  Original Report Authenticated By: Rachel Moulds, M.D.   Dg Chest Portable 1 View  05/22/2011  *RADIOLOGY REPORT*  Clinical Data: Status post failed central line attempt.  PORTABLE CHEST - 1 VIEW  Comparison: Two-view chest 05/22/2011.  Findings: An SVC stent is in place.  Heart size is normal.  The lungs are clear.  The there is no pneumothorax.  IMPRESSION:  1.  No acute cardiopulmonary disease or significant interval change. 2.  No pneumothorax.  Original Report Authenticated By: Resa Miner. MATTERN, M.D.    Brief H and P: For complete details, refer to admission H and P. 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 She was found to be hypotensive at 74/42 (normal BP's are 90's/50's). She  was felt to have SIRs, and possible early sepsis, and was admitted under PCCM service, for further evaluation, investigation and management.  Physical Exam: On 05/30/11. General: Comfortable, alert, communicative, fully oriented, not short of breath at rest.  HEENT: Mild clinical pallor, no jaundice, no conjunctival injection or discharge. Hydration status appears fair.  NECK: Supple, JVP not seen, no carotid bruits, no palpable lymphadenopathy, no palpable goiter.  CHEST: Clinically clear to auscultation, no wheezes, no crackles.  HEART: Sounds 1 and 2 heard, normal, regular, no murmurs.  ABDOMEN: Full, soft, non-tender, no palpable organomegaly, no palpable masses, normal bowel sounds.  GENITALIA: Not examined.  LOWER EXTREMITIES: No pitting edema, has prominent varicosities, both LE palpable peripheral pulses.  MUSCULOSKELETAL SYSTEM: Left knee effusion has resolved, patient has full ROM. Patient has tenderness/swelling  1st MTP joint left great toe.  CENTRAL NERVOUS SYSTEM: No focal neurologic deficit on gross examination.   Hospital Course:  1. AKI/CKD: Patient presented as described above, and was found to be in acute renal failure, with creatinine of 2.89, which climbed rapidly, reaching a high of 4.15 on 05/25/11, likely due to ischemic ATN, in setting of volume depletion/hypotension and sepsis. She was managed with ivi fluids, with improvement/resolution. As of 05/28/11, creatinine was 2.19, ie, close to baseline. Renal consult was provided by Dr Marval Regal. Creatinine has plateaued, but not yet at baseline, and Dr Justin Mend feels this is due to relative hypotension. He has added Midodrine to treatment, and BP was 127/80 in AM of 05/30/11. 2. E. Coli Urosepsis/septic shock: Patient presented with hypotension, a pyrexia of 102,  and borderline wcc, suggestive of SIRS. She was admitted initially to ICU, under care of PCCM, and managed with broad spectrum antibiotics, volume resuscitation and pressors. Blood and urine cultures subsequently demonstrated E. Coli. Clinically, sepsis has now resolved, and patient was successfully weaned of pressors and iv steroids, without deleterious effect. She is now on PO ceftin 500mg  bid and a total of 14 antibiotics is planned, to be concluded on 06/04/11. 3. S/p LRD kidney transplant with allograft nephropathy. She was continued on Prograf 3mg  qam 2mg  qpm and Immuran 100mg  qam.  4. Anemia of chronic disease: Patient is on Epoetin. Last Hgb 8.3 (missed last 2 doses of epo and normal hgb likely due to hemeconcentration from volume depletion. This has remained stable. 5. Anxiety: Patient was managed with Xanax 0.25mg  bid prn, started on 05/26/11. Not problematic.  6. Gastroparesis: She remained asymptomatic on Zofran/Reglan  7. Thrush: An incidental finding. Managed with Nystatin.  8. Left Knee/Leg pain. Patient complained of left knee and leg pain on 05/27/11, and clinically had mild left knee effusion, swelling and pain. She has a known history of Gout, and is chronically on Colchicine, which was re-started on 05/27/11. We scheduled analgesics, venous doppler of 05/28/11, was negative for DVT. Prednisone was increased to 40 mg PO daily, for possible acute gout. A 5-day course is planned, with subsequent taper. Patient has improved, with resolution of knee symptoms, but still has left foot 1st MTP joint pain. Further improvement is anticipated, over the next few days. 9. Pruritic rash. This was present on both thighs, hyperpigmented, raised, with serpinginous active edges, consistent with tinea. Patient has been placed on topical Lotrimin cream.    Comment: Stable for discharge on 05/30/11.  Time spent on Discharge: 45 mins.  Signed: Idalys Konecny,CHRISTOPHER 05/30/2011, 10:50 AM

## 2011-05-29 LAB — RENAL FUNCTION PANEL
Albumin: 2.9 g/dL — ABNORMAL LOW (ref 3.5–5.2)
BUN: 34 mg/dL — ABNORMAL HIGH (ref 6–23)
CO2: 21 mEq/L (ref 19–32)
Calcium: 9.2 mg/dL (ref 8.4–10.5)
Chloride: 107 mEq/L (ref 96–112)
Creatinine, Ser: 2.32 mg/dL — ABNORMAL HIGH (ref 0.50–1.10)
GFR calc Af Amer: 28 mL/min — ABNORMAL LOW (ref >90)
GFR calc non Af Amer: 25 mL/min — ABNORMAL LOW (ref >90)
Glucose, Bld: 84 mg/dL (ref 70–99)
Phosphorus: 2.4 mg/dL (ref 2.3–4.6)
Potassium: 4.4 mEq/L (ref 3.5–5.1)
Sodium: 137 mEq/L (ref 135–145)

## 2011-05-29 LAB — CBC
HCT: 22.2 % — ABNORMAL LOW (ref 36.0–46.0)
Hemoglobin: 7.6 g/dL — ABNORMAL LOW (ref 12.0–15.0)
MCH: 31.8 pg (ref 26.0–34.0)
MCHC: 34.2 g/dL (ref 30.0–36.0)
MCV: 92.9 fL (ref 78.0–100.0)
Platelets: 227 10*3/uL (ref 150–400)
RBC: 2.39 MIL/uL — ABNORMAL LOW (ref 3.87–5.11)
RDW: 20.1 % — ABNORMAL HIGH (ref 11.5–15.5)
WBC: 5.7 10*3/uL (ref 4.0–10.5)

## 2011-05-29 MED ORDER — MIDODRINE HCL 5 MG PO TABS
10.0000 mg | ORAL_TABLET | Freq: Two times a day (BID) | ORAL | Status: DC
  Administered 2011-05-29 – 2011-05-30 (×3): 10 mg via ORAL
  Filled 2011-05-29 (×6): qty 2

## 2011-05-29 MED ORDER — CLOTRIMAZOLE 1 % EX CREA
TOPICAL_CREAM | Freq: Two times a day (BID) | CUTANEOUS | Status: DC
  Administered 2011-05-29 – 2011-05-30 (×2): via TOPICAL
  Filled 2011-05-29: qty 15

## 2011-05-29 MED ORDER — OXYCODONE HCL 5 MG PO TABS
10.0000 mg | ORAL_TABLET | Freq: Four times a day (QID) | ORAL | Status: DC
  Administered 2011-05-29 – 2011-05-30 (×3): 10 mg via ORAL
  Filled 2011-05-29 (×3): qty 2

## 2011-05-29 NOTE — Progress Notes (Addendum)
Subjective: Left knee pain has resolved, however, patient is still having significant left first MTP joint pain. Also complains about long-standing pruritic dermatosis both thighs.   Objective: Vital signs in last 24 hours: Temp:  [98.2 F (36.8 C)-98.7 F (37.1 C)] 98.6 F (37 C) (04/26 0916) Pulse Rate:  [59-67] 59  (04/26 0916) Resp:  [18-20] 18  (04/26 0916) BP: (93-107)/(59-66) 93/59 mmHg (04/26 0916) SpO2:  [98 %-100 %] 100 % (04/26 0916) Weight:  [63.5 kg (139 lb 15.9 oz)] 63.5 kg (139 lb 15.9 oz) (04/25 2043) Weight change: 0 kg (0 lb) Last BM Date: 05/27/11  Intake/Output from previous day: 04/25 0701 - 04/26 0700 In: 1260 [P.O.:1260] Out: 1050 [Urine:1050] Total I/O In: 0  Out: 200 [Urine:200]   Physical Exam: General: Comfortable, alert, communicative, fully oriented, not short of breath at rest.  HEENT:  Mild clinical pallor, no jaundice, no conjunctival injection or discharge. Hydration status appears fair. NECK:  Supple, JVP not seen, no carotid bruits, no palpable lymphadenopathy, no palpable goiter. CHEST:  Clinically clear to auscultation, no wheezes, no crackles. HEART:  Sounds 1 and 2 heard, normal, regular, no murmurs. ABDOMEN:  Full, soft, non-tender, no palpable organomegaly, no palpable masses, normal bowel sounds. GENITALIA:  Not examined. LOWER EXTREMITIES:  No pitting edema, has prominent varicosities, both LE palpable peripheral pulses. MUSCULOSKELETAL SYSTEM:  Left knee effusion has resolved, patient has full ROM. Patient has tenderness 1st MTP joint left big. CENTRAL NERVOUS SYSTEM:  No focal neurologic deficit on gross examination.  Lab Results:  [facility] 05/29/11 0733 05/28/11 0620  WBC 5.7 5.2  HGB 7.6* 7.9*  HCT 22.2* 23.6*  PLT 227 208    Basename 05/29/11 0733 05/28/11 0620  NA 137 136  K 4.4 4.7  CL 107 107  CO2 21 20  GLUCOSE 84 108*  BUN 34* 37*  CREATININE 2.32* 2.19*  CALCIUM 9.2 9.2   Recent Results (from the past 240  hour(s))  CULTURE, BLOOD (ROUTINE X 2)     Status: Normal   Collection Time   05/22/11 12:00 PM      Component Value Range Status Comment   Specimen Description BLOOD LEFT ARM   Final    Special Requests BOTTLES DRAWN AEROBIC ONLY 1.5CC   Final    Culture  Setup Time [id number]   Final    Culture     Final    Value: ESCHERICHIA COLI     Note: Gram Stain Report Called to,Read Back By and Verified With: THERESA CRITE @0735  ON 05/23/11 BY MCLET   Report Status 05/25/2011 FINAL   Final    Organism ID, Bacteria ESCHERICHIA COLI   Final   CULTURE, BLOOD (ROUTINE X 2)     Status: Normal   Collection Time   05/22/11 12:18 PM      Component Value Range Status Comment   Specimen Description BLOOD LEFT HAND   Final    Special Requests BOTTLES DRAWN AEROBIC ONLY [facility]   Final    Culture  Setup Time [id number]   Final    Culture     Final    Value: ESCHERICHIA COLI     Note: SUSCEPTIBILITIES PERFORMED ON PREVIOUS CULTURE WITHIN THE LAST 5 DAYS.     Note: Gram Stain Report Called to,Read Back By and Verified With: RN SABO ON 05/23/11 AT 1142 BY TEDAR   Report Status 05/25/2011 FINAL   Final   URINE CULTURE     Status: Normal   Collection Time  05/22/11  1:23 PM      Component Value Range Status Comment   Specimen Description URINE, RANDOM   Final    Special Requests NONE   Final    Culture  Setup Time [id number]   Final    Colony Count 80,000 COLONIES/ML   Final    Culture ESCHERICHIA COLI   Final    Report Status 05/24/2011 FINAL   Final    Organism ID, Bacteria ESCHERICHIA COLI   Final   MRSA PCR SCREENING     Status: Normal   Collection Time   05/22/11  7:21 PM      Component Value Range Status Comment   MRSA by PCR NEGATIVE  NEGATIVE  Final   URINE CULTURE     Status: Normal   Collection Time   05/23/11  1:16 PM      Component Value Range Status Comment   Specimen Description URINE, CLEAN CATCH   Final    Special Requests Immunocompromised   Final    Culture  Setup Time  [id number]   Final    Colony Count 5,000 COLONIES/ML   Final    Culture INSIGNIFICANT GROWTH   Final    Report Status 05/24/2011 FINAL   Final      Studies/Results: No results found.  Medications: Scheduled Meds:    . azaTHIOprine  100 mg Oral Daily  . cefUROXime  500 mg Oral BID WC  . colchicine  0.6 mg Oral Daily  . darbepoetin  100 mcg Subcutaneous Q14 Days  . heparin  5,000 Units Subcutaneous Q8H  . metoCLOPramide (REGLAN) injection  5 mg Intravenous TID AC  . midodrine  10 mg Oral BID WC  . nystatin  10 mL Oral TID AC & HS  . oxyCODONE  5 mg Oral QID  . polyethylene glycol  17 g Oral Daily  . predniSONE  40 mg Oral Q breakfast  . sodium chloride  10-40 mL Intracatheter Q12H  . tacrolimus  2 mg Oral QHS  . tacrolimus  3 mg Oral Daily   Continuous Infusions:  PRN Meds:.acetaminophen, acetaminophen, ALPRAZolam, diphenhydrAMINE, guaifenesin, HYDROmorphone (DILAUDID) injection, ondansetron (ZOFRAN) IV, ondansetron, sodium chloride  Assessment/Plan: 1. AKI/CKD Likely due to ischemic ATN, in setting of volume depletion/hypotension and sepsis. Now clinically euvolemic and stable. Managing per renal. Creatinine has plateaued, but not yet at baseline, and Dr Justin Mend feel this is due to relative hypotension. He has added Midodrine to Rx, today. 2. E. Coli Urosepsis/septic shock: Patient presented with hypotension, a pyrexia of 102, and borderline wcc, suggestive of SIRS. She was admitted initially to ICU, under care of PCCM, and managed with broad spectrum antibiotics, volume resuscitation and pressors. Blood and urine cultures subsequently demonstrated E. Coli. Clinically, sepsis has now resolved, and patient was successfully weaned of pressors and iv steroids, without deleterious effect. She is now on PO ceftin 500mg  bid and is on day # 8 of 14 total days of abx. 3. S/p LRD kidney transplant with allograft nephropathy. Continued on Prograf 3mg  qam 2mg  qpm and immuran 100mg  qam.  4.  Anemia of chronic disease: Patient is on Epoetin. Last Hgb 8.3 (missed last 2 doses of epo and normal hgb likely due to hemeconcentration from volume depletion.  5. Anxiety: Stable on Xanax 0.25mg  bid prn, started on 05/26/11. Not problematic. 6. Gastroparesis: Asymptomatic on Zofran/Reglan  7. Thrush: On Nystatin. Left Knee/Leg pain. Patient complained of left knee and leg pain on 05/27/11, and clinically had mild left knee effusion,  swelling and pain. She has a known history of Gout, and is chronically on Colchicine, which was re-started on 05/27/11. We have commenced scheduled analgesics, venous doppler of 05/28/11, was negative for DVT. Prednisone was increased to 40 mg PO daily, for possible acute gout. A 5-day course is planned, with subsequent taper. Patient has improved, with resolution of knee symptoms, but still has left foot 1st MTP joint pain. Continue management. 8. Pruritic rash. This is present on both thighs, is hyperpigmented, raised, with serpinginous active edges, consistent with tinea. Will treat with topical Lotrimin cream.  Comment: Aiming discharge possibly in next few days.   LOS: 7 days   Mohammed Mcandrew,CHRISTOPHER 05/29/2011, 3:09 PM

## 2011-05-29 NOTE — Progress Notes (Signed)
05/29/2011 [place] [place] Case Management Note [phone]  Utilization review completed.

## 2011-05-29 NOTE — Progress Notes (Signed)
[facility] ROUNDING NOTE   Subjective:   Interval History: none. Still complains of gout in great toe of left foot  Objective:  Vital signs in last 24 hours:  Temp:  [98.2 F (36.8 C)-98.7 F (37.1 C)] 98.6 F (37 C) (04/26 0916) Pulse Rate:  [59-67] 59  (04/26 0916) Resp:  [18-20] 18  (04/26 0916) BP: (93-107)/(59-66) 93/59 mmHg (04/26 0916) SpO2:  [98 %-100 %] 100 % (04/26 0916) Weight:  [63.5 kg (139 lb 15.9 oz)] 63.5 kg (139 lb 15.9 oz) (04/25 2043)  Weight change: 0 kg (0 lb) Filed Weights   05/26/11 2155 05/27/11 2018 05/28/11 2043  Weight: 63.5 kg (139 lb 15.9 oz) 63.5 kg (139 lb 15.9 oz) 63.5 kg (139 lb 15.9 oz)    Intake/Output: I/O last 3 completed shifts: In: 2040 [P.O.:2040] Out: 1975 [Urine:1975]   Intake/Output this shift:     CVS- RRR RS- CTA ABD- BS present soft non-distended EXT- no edema   Basic Metabolic Panel:  Lab [id number] 05/28/11 0620 05/27/11 1900 05/27/11 0540 05/26/11 1033 05/24/11 0455  NA 137 136 138 137 136 --  K 4.4 4.7 4.2 3.8 3.7 --  CL 107 107 107 106 106 --  CO2 21 20 20 19 19  --  GLUCOSE 84 108* 103* 75 96 --  BUN 34* 37* 41* 54* 66* --  CREATININE 2.32* 2.19* 2.32* 2.68* 3.50* --  CALCIUM 9.2 9.2 9.2 -- -- --  MG -- -- -- -- -- --  PHOS 2.4 3.8 3.0 3.1 -- 4.3    Liver Function Tests:  Lab 05/29/11 0733 05/28/11 0620 05/27/11 1900 05/27/11 0540 05/25/11 0830 05/22/11 1238  AST -- -- -- -- 33 30  ALT -- -- -- -- 16 11  ALKPHOS -- -- -- -- 131* 75  BILITOT -- -- -- -- 0.2* 1.3*  PROT -- -- -- -- 6.3 6.7  ALBUMIN 2.9* 3.1* 3.1* 2.8* 3.0* --    Lab 05/22/11 1238  LIPASE 20  AMYLASE --   No results found for this basename: AMMONIA:3 in the last 168 hours  CBC:  Lab 05/29/11 0733 05/28/11 0620 05/27/11 0540 05/25/11 0830 05/24/11 0455 05/22/11 1238  WBC 5.7 5.2 5.1 7.7 12.3* --  NEUTROABS -- -- -- -- -- 9.2*  HGB 7.6* 7.9* 8.5* 8.5* 8.5* --  HCT 22.2* 23.6* 25.0* 25.3* 25.0* --  MCV 92.9  92.2 91.2 92.0 91.6 --  PLT 227 208 170 142* 131* --    Cardiac Enzymes:  Lab 05/23/11 0800 05/22/11 2319 05/22/11 1635  CKTOTAL 58 87 81  CKMB 1.9 2.0 1.8  CKMBINDEX -- -- --  TROPONINI <0.30 <0.30 <0.30    BNP: No components found with this basename: POCBNP:5  CBG:  Lab 05/22/11 1952  GLUCAP 119*    Microbiology: Results for orders placed during the hospital encounter of 05/22/11  CULTURE, BLOOD (ROUTINE X 2)     Status: Normal   Collection Time   05/22/11 12:00 PM      Component Value Range Status Comment   Specimen Description BLOOD LEFT ARM   Final    Special Requests BOTTLES DRAWN AEROBIC ONLY 1.5CC   Final    Culture  Setup Time [id number]   Final    Culture     Final    Value: ESCHERICHIA COLI     Note: Gram Stain Report Called to,Read Back By and Verified With: THERESA CRITE @0735  ON 05/23/11 BY MCLET   Report Status 05/25/2011 FINAL  Final    Organism ID, Bacteria ESCHERICHIA COLI   Final   CULTURE, BLOOD (ROUTINE X 2)     Status: Normal   Collection Time   05/22/11 12:18 PM      Component Value Range Status Comment   Specimen Description BLOOD LEFT HAND   Final    Special Requests BOTTLES DRAWN AEROBIC ONLY [facility]   Final    Culture  Setup Time [id number]   Final    Culture     Final    Value: ESCHERICHIA COLI     Note: SUSCEPTIBILITIES PERFORMED ON PREVIOUS CULTURE WITHIN THE LAST 5 DAYS.     Note: Gram Stain Report Called to,Read Back By and Verified With: RN SABO ON 05/23/11 AT 1142 BY TEDAR   Report Status 05/25/2011 FINAL   Final   URINE CULTURE     Status: Normal   Collection Time   05/22/11  1:23 PM      Component Value Range Status Comment   Specimen Description URINE, RANDOM   Final    Special Requests NONE   Final    Culture  Setup Time [id number]   Final    Colony Count 80,000 COLONIES/ML   Final    Culture ESCHERICHIA COLI   Final    Report Status 05/24/2011 FINAL   Final    Organism ID, Bacteria ESCHERICHIA COLI   Final   MRSA PCR  SCREENING     Status: Normal   Collection Time   05/22/11  7:21 PM      Component Value Range Status Comment   MRSA by PCR NEGATIVE  NEGATIVE  Final   URINE CULTURE     Status: Normal   Collection Time   05/23/11  1:16 PM      Component Value Range Status Comment   Specimen Description URINE, CLEAN CATCH   Final    Special Requests Immunocompromised   Final    Culture  Setup Time [id number]   Final    Colony Count 5,000 COLONIES/ML   Final    Culture INSIGNIFICANT GROWTH   Final    Report Status 05/24/2011 FINAL   Final     Coagulation Studies: No results found for this basename: LABPROT:5,INR:5 in the last 72 hours  Urinalysis: No results found for this basename: COLORURINE:2,APPERANCEUR:2,LABSPEC:2,PHURINE:2,GLUCOSEU:2,HGBUR:2,BILIRUBINUR:2,KETONESUR:2,PROTEINUR:2,UROBILINOGEN:2,NITRITE:2,LEUKOCYTESUR:2 in the last 72 hours    Imaging: No results found.   Medications:        . azaTHIOprine  100 mg Oral Daily  . cefUROXime  500 mg Oral BID WC  . colchicine  0.6 mg Oral Daily  . darbepoetin  100 mcg Subcutaneous Q14 Days  . heparin  5,000 Units Subcutaneous Q8H  . metoCLOPramide (REGLAN) injection  5 mg Intravenous TID AC  . nystatin  10 mL Oral TID AC & HS  . oxyCODONE  5 mg Oral QID  . polyethylene glycol  17 g Oral Daily  . predniSONE  40 mg Oral Q breakfast  . sodium chloride  10-40 mL Intracatheter Q12H  . tacrolimus  2 mg Oral QHS  . tacrolimus  3 mg Oral Daily   acetaminophen, acetaminophen, ALPRAZolam, diphenhydrAMINE, guaifenesin, HYDROmorphone (DILAUDID) injection, ondansetron (ZOFRAN) IV, ondansetron, sodium chloride  Assessment/ Plan:  ESRD-s/p transplant x 3 Acute renal failure in setting of E Coli sepsis  ANEMIA-last HB 8.5  MBD-stable  HTN/VOL-controlled Blood pressure soft will start midodrine 10 BID Patient recovering renal function stalled. I think this is due to hypotension. I will start midodrine  LOS: 7 [facility] W @TODAY @9 :37 AM

## 2011-05-30 LAB — CBC
HCT: 23.7 % — ABNORMAL LOW (ref 36.0–46.0)
Hemoglobin: 7.9 g/dL — ABNORMAL LOW (ref 12.0–15.0)
MCH: 31.1 pg (ref 26.0–34.0)
MCHC: 33.3 g/dL (ref 30.0–36.0)
MCV: 93.3 fL (ref 78.0–100.0)
Platelets: 254 10*3/uL (ref 150–400)
RBC: 2.54 MIL/uL — ABNORMAL LOW (ref 3.87–5.11)
RDW: 20.6 % — ABNORMAL HIGH (ref 11.5–15.5)
WBC: 6.6 10*3/uL (ref 4.0–10.5)

## 2011-05-30 LAB — RENAL FUNCTION PANEL
Albumin: 3.1 g/dL — ABNORMAL LOW (ref 3.5–5.2)
BUN: 33 mg/dL — ABNORMAL HIGH (ref 6–23)
CO2: 20 mEq/L (ref 19–32)
Calcium: 9.4 mg/dL (ref 8.4–10.5)
Chloride: 105 mEq/L (ref 96–112)
Creatinine, Ser: 2.2 mg/dL — ABNORMAL HIGH (ref 0.50–1.10)
GFR calc Af Amer: 30 mL/min — ABNORMAL LOW (ref >90)
GFR calc non Af Amer: 26 mL/min — ABNORMAL LOW (ref >90)
Glucose, Bld: 82 mg/dL (ref 70–99)
Phosphorus: 2.7 mg/dL (ref 2.3–4.6)
Potassium: 4.5 mEq/L (ref 3.5–5.1)
Sodium: 134 mEq/L — ABNORMAL LOW (ref 135–145)

## 2011-05-30 MED ORDER — ONDANSETRON HCL 4 MG PO TABS: 4.0000 mg | ORAL_TABLET | Freq: Three times a day (TID) | ORAL | Status: DC | PRN

## 2011-05-30 MED ORDER — ALPRAZOLAM 0.25 MG PO TABS: 0.2500 mg | ORAL_TABLET | Freq: Two times a day (BID) | ORAL | Status: DC | PRN

## 2011-05-30 MED ORDER — CEFUROXIME AXETIL 500 MG PO TABS: 500.0000 mg | ORAL_TABLET | Freq: Two times a day (BID) | ORAL | Status: DC

## 2011-05-30 MED ORDER — MIDODRINE HCL 10 MG PO TABS: 10.0000 mg | ORAL_TABLET | Freq: Two times a day (BID) | ORAL | Status: DC

## 2011-05-30 MED ORDER — OXYCODONE HCL 10 MG PO TABS: 10.0000 mg | ORAL_TABLET | Freq: Four times a day (QID) | ORAL | Status: DC

## 2011-05-30 MED ORDER — CLOTRIMAZOLE 1 % EX CREA: TOPICAL_CREAM | Freq: Two times a day (BID) | CUTANEOUS | Status: DC

## 2011-05-30 MED ORDER — NYSTATIN 100000 UNIT/ML MT SUSP: 10.0000 mL | Freq: Three times a day (TID) | OROMUCOSAL | Status: DC

## 2011-05-30 MED ORDER — PREDNISONE 10 MG PO TABS: ORAL_TABLET | ORAL | Status: DC

## 2011-05-30 MED ORDER — COLCHICINE 0.6 MG PO TABS: 0.6000 mg | ORAL_TABLET | Freq: Every day | ORAL | Status: DC

## 2011-05-30 NOTE — Progress Notes (Signed)
[facility] ROUNDING NOTE   Subjective:   Interval History: none. Much better this morning  Objective:  Vital signs in last 24 hours:  Temp:  [98.3 F (36.8 C)-98.7 F (37.1 C)] 98.7 F (37.1 C) (04/27 0955) Pulse Rate:  [50-93] 51  (04/27 0955) Resp:  [16-19] 16  (04/27 0955) BP: (105-127)/(65-80) 105/65 mmHg (04/27 0955) SpO2:  [97 %-100 %] 97 % (04/27 0955) Weight:  [61.4 kg (135 lb 5.8 oz)] 61.4 kg (135 lb 5.8 oz) (04/26 2057)  Weight change: -2.1 kg (-4 lb 10.1 oz) Filed Weights   05/27/11 2018 05/28/11 2043 05/29/11 2057  Weight: 63.5 kg (139 lb 15.9 oz) 63.5 kg (139 lb 15.9 oz) 61.4 kg (135 lb 5.8 oz)    Intake/Output: I/O last 3 completed shifts: In: 1412 [P.O.:1404; IV Piggyback:8] Out: 1250 [Urine:1250]   Intake/Output this shift:  Total I/O In: 120 [P.O.:120] Out: 500 [Urine:500]  CVS- RRR RS- CTA ABD- BS present soft non-distended EXT- no edema   Basic Metabolic Panel:  Lab [id number] 0555 05/29/11 0733 05/28/11 0620 05/27/11 1900 05/27/11 0540  NA 134* 137 136 138 137  K 4.5 4.4 4.7 4.2 3.8  CL 105 107 107 107 106  CO2 20 21 20 20 19   GLUCOSE 82 84 108* 103* 75  BUN 33* 34* 37* 41* 54*  CREATININE 2.20* 2.32* 2.19* 2.32* 2.68*  CALCIUM 9.4 9.2 9.2 -- --  MG -- -- -- -- --  PHOS 2.7 2.4 3.8 3.0 3.1    Liver Function Tests:  Lab 05/30/11 0555 05/29/11 0733 05/28/11 0620 05/27/11 1900 05/27/11 0540 05/25/11 0830  AST -- -- -- -- -- 33  ALT -- -- -- -- -- 16  ALKPHOS -- -- -- -- -- 131*  BILITOT -- -- -- -- -- 0.2*  PROT -- -- -- -- -- 6.3  ALBUMIN 3.1* 2.9* 3.1* 3.1* 2.8* --   No results found for this basename: LIPASE:5,AMYLASE:5 in the last 168 hours No results found for this basename: AMMONIA:3 in the last 168 hours  CBC:  Lab 05/30/11 0550 05/29/11 0733 05/28/11 0620 05/27/11 0540 05/25/11 0830  WBC 6.6 5.7 5.2 5.1 7.7  NEUTROABS -- -- -- -- --  HGB 7.9* 7.6* 7.9* 8.5* 8.5*  HCT 23.7* 22.2* 23.6* 25.0* 25.3*  MCV  93.3 92.9 92.2 91.2 92.0  PLT 254 227 208 170 142*    Cardiac Enzymes: No results found for this basename: CKTOTAL:5,CKMB:5,CKMBINDEX:5,TROPONINI:5 in the last 168 hours  BNP: No components found with this basename: POCBNP:5  CBG: No results found for this basename: GLUCAP:5 in the last 168 hours  Microbiology: Results for orders placed during the hospital encounter of 05/22/11  CULTURE, BLOOD (ROUTINE X 2)     Status: Normal   Collection Time   05/22/11 12:00 PM      Component Value Range Status Comment   Specimen Description BLOOD LEFT ARM   Final    Special Requests BOTTLES DRAWN AEROBIC ONLY 1.5CC   Final    Culture  Setup Time [id number]   Final    Culture     Final    Value: ESCHERICHIA COLI     Note: Gram Stain Report Called to,Read Back By and Verified With: THERESA CRITE @0735  ON 05/23/11 BY MCLET   Report Status 05/25/2011 FINAL   Final    Organism ID, Bacteria ESCHERICHIA COLI   Final   CULTURE, BLOOD (ROUTINE X 2)     Status: Normal   Collection Time  05/22/11 12:18 PM      Component Value Range Status Comment   Specimen Description BLOOD LEFT HAND   Final    Special Requests BOTTLES DRAWN AEROBIC ONLY [facility]   Final    Culture  Setup Time [id number]   Final    Culture     Final    Value: ESCHERICHIA COLI     Note: SUSCEPTIBILITIES PERFORMED ON PREVIOUS CULTURE WITHIN THE LAST 5 DAYS.     Note: Gram Stain Report Called to,Read Back By and Verified With: RN SABO ON 05/23/11 AT 1142 BY TEDAR   Report Status 05/25/2011 FINAL   Final   URINE CULTURE     Status: Normal   Collection Time   05/22/11  1:23 PM      Component Value Range Status Comment   Specimen Description URINE, RANDOM   Final    Special Requests NONE   Final    Culture  Setup Time [id number]   Final    Colony Count 80,000 COLONIES/ML   Final    Culture ESCHERICHIA COLI   Final    Report Status 05/24/2011 FINAL   Final    Organism ID, Bacteria ESCHERICHIA COLI   Final   MRSA PCR SCREENING      Status: Normal   Collection Time   05/22/11  7:21 PM      Component Value Range Status Comment   MRSA by PCR NEGATIVE  NEGATIVE  Final   URINE CULTURE     Status: Normal   Collection Time   05/23/11  1:16 PM      Component Value Range Status Comment   Specimen Description URINE, CLEAN CATCH   Final    Special Requests Immunocompromised   Final    Culture  Setup Time [id number]   Final    Colony Count 5,000 COLONIES/ML   Final    Culture INSIGNIFICANT GROWTH   Final    Report Status 05/24/2011 FINAL   Final     Coagulation Studies: No results found for this basename: LABPROT:5,INR:5 in the last 72 hours  Urinalysis: No results found for this basename: COLORURINE:2,APPERANCEUR:2,LABSPEC:2,PHURINE:2,GLUCOSEU:2,HGBUR:2,BILIRUBINUR:2,KETONESUR:2,PROTEINUR:2,UROBILINOGEN:2,NITRITE:2,LEUKOCYTESUR:2 in the last 72 hours    Imaging: No results found.   Medications:        . azaTHIOprine  100 mg Oral Daily  . cefUROXime  500 mg Oral BID WC  . clotrimazole   Topical BID  . colchicine  0.6 mg Oral Daily  . darbepoetin  100 mcg Subcutaneous Q14 Days  . heparin  5,000 Units Subcutaneous Q8H  . metoCLOPramide (REGLAN) injection  5 mg Intravenous TID AC  . midodrine  10 mg Oral BID WC  . nystatin  10 mL Oral TID AC & HS  . oxyCODONE  10 mg Oral QID  . polyethylene glycol  17 g Oral Daily  . predniSONE  40 mg Oral Q breakfast  . sodium chloride  10-40 mL Intracatheter Q12H  . tacrolimus  2 mg Oral QHS  . tacrolimus  3 mg Oral Daily  . DISCONTD: oxyCODONE  5 mg Oral QID   acetaminophen, acetaminophen, ALPRAZolam, diphenhydrAMINE, guaifenesin, HYDROmorphone (DILAUDID) injection, ondansetron (ZOFRAN) IV, ondansetron, sodium chloride  Assessment/ Plan:    ESRD-s/p transplant x 3 Acute renal failure in setting of E Coli sepsis  ANEMIA-last HB 7.9 MBD-stable  HTN/VOL-controlled, Blood pressure better on midodrine Patient recovering renal function Improved today, she will go home  with follow up labs Monday at [place]    LOS: 8 Annelie Boak W @TODAY @12 :49  PM

## 2011-05-30 NOTE — Discharge Instructions (Signed)
Please go to Dr Elissa Hefty office on Monday 06/01/11, for blood draw.

## 2011-06-02 ENCOUNTER — Emergency Department (HOSPITAL_COMMUNITY)
Admission: EM | Admit: 2011-06-02 | Discharge: 2011-06-02 | Disposition: A | Discharge status: Home / Self Care | Payer: Medicaid Other | Attending: Emergency Medicine | Admitting: Emergency Medicine

## 2011-06-02 ENCOUNTER — Encounter (HOSPITAL_COMMUNITY): Payer: Self-pay | Admitting: *Deleted

## 2011-06-02 DIAGNOSIS — A6000 Herpesviral infection of urogenital system, unspecified: Secondary | ICD-10-CM

## 2011-06-02 DIAGNOSIS — F17200 Nicotine dependence, unspecified, uncomplicated: Secondary | ICD-10-CM | POA: Insufficient documentation

## 2011-06-02 DIAGNOSIS — Z79899 Other long term (current) drug therapy: Secondary | ICD-10-CM | POA: Insufficient documentation

## 2011-06-02 DIAGNOSIS — B009 Herpesviral infection, unspecified: Secondary | ICD-10-CM | POA: Insufficient documentation

## 2011-06-02 DIAGNOSIS — M109 Gout, unspecified: Secondary | ICD-10-CM | POA: Insufficient documentation

## 2011-06-02 HISTORY — DX: Herpesviral infection of urogenital system, unspecified: A60.00

## 2011-06-02 MED ORDER — VALACYCLOVIR HCL 500 MG PO TABS: 500.0000 mg | ORAL_TABLET | Freq: Every day | ORAL | Status: AC

## 2011-06-02 NOTE — ED Notes (Signed)
Pt is here with herpes exacerbation vaginally and around anal area

## 2011-06-02 NOTE — Discharge Instructions (Signed)
Genital Herpes Genital herpes is a sexually transmitted disease. This means that it is a disease passed by having sex with an infected person. There is no cure for genital herpes. The time between attacks can be months to years. The virus may live in a person but produce no problems (symptoms). This infection can be passed to a baby as it travels down the birth canal (vagina). In a newborn, this can cause central nervous system damage, eye damage, or even death. The virus that causes genital herpes is usually HSV-2 virus. The virus that causes oral herpes is usually HSV-1. The diagnosis (learning what is wrong) is made through culture results. SYMPTOMS  Usually symptoms of pain and itching begin a few days to a week after contact. It first appears as small blisters that progress to small painful ulcers which then scab over and heal after several days. It affects the outer genitalia, birth canal, cervix, penis, anal area, buttocks, and thighs. HOME CARE INSTRUCTIONS   Keep ulcerated areas dry and clean.   Take medications as directed. Antiviral medications can speed up healing. They will not prevent recurrences or cure this infection. These medications can also be taken for suppression if there are frequent recurrences.   While the infection is active, it is contagious. Avoid all sexual contact during active infections.   Condoms may help prevent spread of the herpes virus.   Practice safe sex.   Wash your hands thoroughly after touching the genital area.   Avoid touching your eyes after touching your genital area.   Inform your caregiver if you have had genital herpes and become pregnant. It is your responsibility to insure a safe outcome for your baby in this pregnancy.   Only take over-the-counter or prescription medicines for pain, discomfort, or fever as directed by your caregiver.  SEEK MEDICAL CARE IF:   You have a recurrence of this infection.   You do not respond to medications and  are not improving.   You have new sources of pain or discharge which have changed from the original infection.   You have an oral temperature above 102 F (38.9 C).   You develop abdominal pain.   You develop eye pain or signs of eye infection.  Document Released: 01/17/2000 Document Revised: 01/08/2011 Document Reviewed: 02/06/2009 [facility] Patient Information 2012 [place].

## 2011-06-02 NOTE — ED Notes (Signed)
MD at bedside. 

## 2011-06-02 NOTE — ED Provider Notes (Signed)
History     CSN: [id number]  Arrival date & time 06/02/11  [id number]   First MD Initiated Contact with Patient 06/02/11 [phone]      Chief Complaint  Patient presents with  . vaginal discomfort     herpes    (Consider location/radiation/quality/duration/timing/severity/associated sxs/prior treatment) HPI Comments: Patient has a history of genital herpes and is having a recurrent outbreak. She has multiple medical problems and states her doctor was not comfortable placing her on valtrex do to the concern for interactions.  She is having 8/10 pain that is sharp but there is no drainage, redness or swelling to the area. She is able to urinate without difficulty.  Patient is a 46 y.o. female presenting with vaginal itching. The history is provided by the patient.  Vaginal Itching This is a recurrent problem. The current episode started more than 2 days ago. The problem occurs constantly. The problem has been gradually worsening. Pertinent negatives include no abdominal pain, no headaches and no shortness of breath. Exacerbated by: certain position. The symptoms are relieved by nothing. Treatments tried: lidocaine jelly. The treatment provided mild relief.    Past Medical History  Diagnosis Date  . Gastroparesis   . Hx of kidney transplant   . Gout   . Hypotension   . Herpes genitalia     Past Surgical History  Procedure Date  . Kidney transplant   . Appendectomy   . Eye surgery     Family History  Problem Relation Age of Onset  . Hypertension Maternal Grandmother     History  Substance Use Topics  . Smoking status: Current Everyday Smoker -- 0.5 packs/day    Types: Cigarettes  . Smokeless tobacco: Never Used  . Alcohol Use: Yes     rare    OB History    Grav Para Term Preterm Abortions TAB SAB Ect Mult Living                  Review of Systems  Constitutional: Negative for fever.  Respiratory: Negative for shortness of breath.   Cardiovascular: Positive for leg  swelling.  Gastrointestinal: Negative for abdominal pain.  Neurological: Negative for headaches.  All other systems reviewed and are negative.    Allergies  Levofloxacin  Home Medications   Current Outpatient Rx  Name Route Sig Dispense Refill  . ALPRAZOLAM 0.25 MG PO TABS Oral Take 0.25 mg by mouth 2 (two) times daily as needed. For anxiety    . ASPIRIN EC 81 MG PO TBEC Oral Take 81 mg by mouth daily.    . AZATHIOPRINE 50 MG PO TABS Oral Take 100 mg by mouth daily.    [name] CALCITRIOL 0.25 MCG PO CAPS Oral Take 0.25 mcg by mouth daily.    [name] CALCIUM CARBONATE ANTACID 750 MG PO CHEW Oral Chew 1-2 tablets by mouth as needed. For stomach    . CEFUROXIME AXETIL 500 MG PO TABS Oral Take 500 mg by mouth 2 (two) times daily. Started on 05/30/11 - 6 doses remain    . COLCHICINE 0.6 MG PO TABS Oral Take 0.6 mg by mouth daily.    . EPOETIN ALFA 16109 UNIT/ML IJ SOLN Subcutaneous Inject 40,000 Units into the skin every 30 (thirty) days.     . FUROSEMIDE 40 MG PO TABS Oral Take 80 mg by mouth daily.    [name] LIDOCAINE 5 % EX OINT Topical Apply 1 application topically 3 (three) times daily as needed. Applied to painful area    .  METOCLOPRAMIDE HCL 5 MG PO TABS Oral Take 5 mg by mouth at bedtime.    [name] MIDODRINE HCL 10 MG PO TABS Oral Take 10 mg by mouth 2 (two) times daily.    . ADULT MULTIVITAMIN W/MINERALS CH Oral Take 1 tablet by mouth daily.    [name] OMEPRAZOLE 40 MG PO CPDR Oral Take 40 mg by mouth 2 (two) times daily.     [name] ONDANSETRON 8 MG PO TBDP Oral Take 8 mg by mouth daily.    . OXYCODONE HCL 10 MG PO TABS Oral Take 10 mg by mouth 4 (four) times daily as needed. For pain    . OXYCODONE-ACETAMINOPHEN 10-650 MG PO TABS Oral Take 1 tablet by mouth every 6 (six) hours as needed. For pain.    [name] PREDNISONE 10 MG PO TABS Oral Take 10-40 mg by mouth daily. Started on 05/30/11 - 40mg  daily for 2 days, 30mg  for 2 days, 20mg  for 2 days, 10mg  for 3 days, then stop    . PREDNISONE 5 MG PO TABS Oral Take 5 mg by  mouth daily.    [name] PROMETHAZINE HCL 25 MG PO TABS Oral Take 25 mg by mouth every 6 (six) hours as needed. For nausea     . ROSUVASTATIN CALCIUM 5 MG PO TABS Oral Take 5 mg by mouth daily.    [name] TACROLIMUS 1 MG PO CAPS Oral Take 2-3 mg by mouth 2 (two) times daily. 3 tabs in the morning, 2 tabs in the evening    . ZOLPIDEM TARTRATE 10 MG PO TABS Oral Take 10 mg by mouth at bedtime as needed. To help sleep.      BP 107/63  Pulse 64  Temp(Src) 98.4 F (36.9 C) (Oral)  Resp 20  SpO2 91%  Physical Exam  Nursing note and vitals reviewed. Constitutional: She is oriented to person, place, and time. She appears well-developed and well-nourished. No distress.  HENT:  Head: Normocephalic and atraumatic.  Eyes: EOM are normal. Pupils are equal, round, and reactive to light.  Cardiovascular: Normal rate, regular rhythm, normal heart sounds and intact distal pulses.  Exam reveals no friction rub.   No murmur heard. Pulmonary/Chest: Effort normal and breath sounds normal. She has no wheezes. She has no rales.  Genitourinary:       Vesicular lesions presents in the junction of the proximal labia and urethra  Musculoskeletal: Normal range of motion. She exhibits edema. She exhibits no tenderness.       2-3+ edema  Neurological: She is alert and oriented to person, place, and time. No cranial nerve deficit.  Skin: Skin is warm and dry. No rash noted.  Psychiatric: She has a normal mood and affect. Her behavior is normal.    ED Course  Procedures (including critical care time)  Labs Reviewed - No data to display No results found.   No diagnosis found.    MDM   Patient presenting due to recurrent herpes outbreak in her vaginal area. She has several vesicles present on exam.. Patient has multiple medical problems including renal transplant patient with recent Escherichia coli UTI and sepsis. She is on antibiotics but no longer has a PICC line. She saw her doctor who was unclear if she can get  Valtrex. Spoke with the pharmacist who has also checks her medication list and there is no interaction between Valtrex in her medications. Also due to her decreased renal function will do a modified dosing of 500 mg daily.  Blanchie Dessert, MD 06/02/11 [phone]

## 2011-06-02 NOTE — ED Notes (Addendum)
Pt presents with an outbreak of genital "herpes" since Thursday. Pt states that she was seen by her OBGYN yesterday and given lidocaine. Pt reports a history of e coli and states that she had her PICC line taken out yesterday and is currently taking oral antibiotics.

## 2011-06-12 ENCOUNTER — Inpatient Hospital Stay (HOSPITAL_COMMUNITY)
Admission: EM | Admit: 2011-06-12 | Discharge: 2011-06-22 | DRG: 871 | Disposition: A | Discharge status: Home / Self Care | Payer: Medicaid Other | Source: Ambulatory Visit | Attending: Family Medicine | Admitting: Family Medicine

## 2011-06-12 ENCOUNTER — Inpatient Hospital Stay (HOSPITAL_COMMUNITY): Payer: Medicaid Other

## 2011-06-12 ENCOUNTER — Encounter (HOSPITAL_COMMUNITY): Payer: Self-pay | Admitting: *Deleted

## 2011-06-12 DIAGNOSIS — K3184 Gastroparesis: Secondary | ICD-10-CM | POA: Diagnosis present

## 2011-06-12 DIAGNOSIS — N23 Unspecified renal colic: Secondary | ICD-10-CM

## 2011-06-12 DIAGNOSIS — E860 Dehydration: Secondary | ICD-10-CM

## 2011-06-12 DIAGNOSIS — I9589 Other hypotension: Secondary | ICD-10-CM | POA: Diagnosis present

## 2011-06-12 DIAGNOSIS — E785 Hyperlipidemia, unspecified: Secondary | ICD-10-CM | POA: Diagnosis present

## 2011-06-12 DIAGNOSIS — Z940 Kidney transplant status: Secondary | ICD-10-CM

## 2011-06-12 DIAGNOSIS — R112 Nausea with vomiting, unspecified: Secondary | ICD-10-CM | POA: Diagnosis present

## 2011-06-12 DIAGNOSIS — N186 End stage renal disease: Secondary | ICD-10-CM | POA: Diagnosis present

## 2011-06-12 DIAGNOSIS — B9620 Unspecified Escherichia coli [E. coli] as the cause of diseases classified elsewhere: Secondary | ICD-10-CM | POA: Diagnosis present

## 2011-06-12 DIAGNOSIS — R7881 Bacteremia: Secondary | ICD-10-CM | POA: Diagnosis present

## 2011-06-12 DIAGNOSIS — Z8673 Personal history of transient ischemic attack (TIA), and cerebral infarction without residual deficits: Secondary | ICD-10-CM

## 2011-06-12 DIAGNOSIS — N179 Acute kidney failure, unspecified: Secondary | ICD-10-CM | POA: Diagnosis present

## 2011-06-12 DIAGNOSIS — R5381 Other malaise: Secondary | ICD-10-CM

## 2011-06-12 DIAGNOSIS — B370 Candidal stomatitis: Secondary | ICD-10-CM | POA: Diagnosis present

## 2011-06-12 DIAGNOSIS — A4151 Sepsis due to Escherichia coli [E. coli]: Principal | ICD-10-CM | POA: Diagnosis present

## 2011-06-12 DIAGNOSIS — F411 Generalized anxiety disorder: Secondary | ICD-10-CM | POA: Diagnosis present

## 2011-06-12 DIAGNOSIS — Z9089 Acquired absence of other organs: Secondary | ICD-10-CM

## 2011-06-12 DIAGNOSIS — D631 Anemia in chronic kidney disease: Secondary | ICD-10-CM | POA: Diagnosis present

## 2011-06-12 DIAGNOSIS — R5383 Other fatigue: Secondary | ICD-10-CM

## 2011-06-12 DIAGNOSIS — K219 Gastro-esophageal reflux disease without esophagitis: Secondary | ICD-10-CM | POA: Diagnosis present

## 2011-06-12 DIAGNOSIS — N189 Chronic kidney disease, unspecified: Secondary | ICD-10-CM | POA: Diagnosis present

## 2011-06-12 DIAGNOSIS — A419 Sepsis, unspecified organism: Secondary | ICD-10-CM | POA: Diagnosis present

## 2011-06-12 DIAGNOSIS — R6520 Severe sepsis without septic shock: Secondary | ICD-10-CM | POA: Diagnosis present

## 2011-06-12 DIAGNOSIS — M109 Gout, unspecified: Secondary | ICD-10-CM | POA: Diagnosis present

## 2011-06-12 DIAGNOSIS — Z888 Allergy status to other drugs, medicaments and biological substances status: Secondary | ICD-10-CM

## 2011-06-12 DIAGNOSIS — R609 Edema, unspecified: Secondary | ICD-10-CM | POA: Diagnosis present

## 2011-06-12 DIAGNOSIS — T8619 Other complication of kidney transplant: Secondary | ICD-10-CM

## 2011-06-12 DIAGNOSIS — B009 Herpesviral infection, unspecified: Secondary | ICD-10-CM | POA: Diagnosis present

## 2011-06-12 DIAGNOSIS — N390 Urinary tract infection, site not specified: Secondary | ICD-10-CM

## 2011-06-12 DIAGNOSIS — Z87891 Personal history of nicotine dependence: Secondary | ICD-10-CM

## 2011-06-12 DIAGNOSIS — R58 Hemorrhage, not elsewhere classified: Secondary | ICD-10-CM

## 2011-06-12 DIAGNOSIS — F419 Anxiety disorder, unspecified: Secondary | ICD-10-CM | POA: Diagnosis present

## 2011-06-12 HISTORY — DX: End stage renal disease: N18.6

## 2011-06-12 HISTORY — DX: Bacteremia: R78.81

## 2011-06-12 HISTORY — DX: Cerebral infarction, unspecified: I63.9

## 2011-06-12 LAB — DIFFERENTIAL
Basophils Absolute: 0 10*3/uL (ref 0.0–0.1)
Basophils Relative: 0 % (ref 0–1)
Eosinophils Absolute: 0 10*3/uL (ref 0.0–0.7)
Eosinophils Relative: 0 % (ref 0–5)
Lymphocytes Relative: 7 % — ABNORMAL LOW (ref 12–46)
Lymphs Abs: 0.4 10*3/uL — ABNORMAL LOW (ref 0.7–4.0)
Monocytes Absolute: 0.3 10*3/uL (ref 0.1–1.0)
Monocytes Relative: 5 % (ref 3–12)
Neutro Abs: 5.5 10*3/uL (ref 1.7–7.7)
Neutrophils Relative %: 87 % — ABNORMAL HIGH (ref 43–77)

## 2011-06-12 LAB — URINALYSIS, ROUTINE W REFLEX MICROSCOPIC
Bilirubin Urine: NEGATIVE
Glucose, UA: NEGATIVE mg/dL
Ketones, ur: NEGATIVE mg/dL
Nitrite: NEGATIVE
Protein, ur: NEGATIVE mg/dL
Specific Gravity, Urine: 1.007 (ref 1.005–1.030)
Urobilinogen, UA: 0.2 mg/dL (ref 0.0–1.0)
pH: 6 (ref 5.0–8.0)

## 2011-06-12 LAB — CBC
HCT: 24.2 % — ABNORMAL LOW (ref 36.0–46.0)
HCT: 23.7 % — ABNORMAL LOW (ref 36.0–46.0)
Hemoglobin: 8.1 g/dL — ABNORMAL LOW (ref 12.0–15.0)
Hemoglobin: 7.9 g/dL — ABNORMAL LOW (ref 12.0–15.0)
MCH: 31.3 pg (ref 26.0–34.0)
MCH: 31.6 pg (ref 26.0–34.0)
MCHC: 33.5 g/dL (ref 30.0–36.0)
MCHC: 33.3 g/dL (ref 30.0–36.0)
MCV: 93.4 fL (ref 78.0–100.0)
MCV: 94.8 fL (ref 78.0–100.0)
Platelets: 171 10*3/uL (ref 150–400)
Platelets: 138 10*3/uL — ABNORMAL LOW (ref 150–400)
RBC: 2.59 MIL/uL — ABNORMAL LOW (ref 3.87–5.11)
RBC: 2.5 MIL/uL — ABNORMAL LOW (ref 3.87–5.11)
RDW: 20.6 % — ABNORMAL HIGH (ref 11.5–15.5)
RDW: 20.6 % — ABNORMAL HIGH (ref 11.5–15.5)
WBC: 6.2 10*3/uL (ref 4.0–10.5)
WBC: 4.9 10*3/uL (ref 4.0–10.5)

## 2011-06-12 LAB — CREATININE, SERUM
Creatinine, Ser: 1.8 mg/dL — ABNORMAL HIGH (ref 0.50–1.10)
GFR calc Af Amer: 39 mL/min — ABNORMAL LOW (ref >90)
GFR calc non Af Amer: 33 mL/min — ABNORMAL LOW (ref >90)

## 2011-06-12 LAB — BASIC METABOLIC PANEL
BUN: 25 mg/dL — ABNORMAL HIGH (ref 6–23)
CO2: 27 mEq/L (ref 19–32)
Calcium: 9.7 mg/dL (ref 8.4–10.5)
Chloride: 99 mEq/L (ref 96–112)
Creatinine, Ser: 1.68 mg/dL — ABNORMAL HIGH (ref 0.50–1.10)
GFR calc Af Amer: 42 mL/min — ABNORMAL LOW (ref >90)
GFR calc non Af Amer: 36 mL/min — ABNORMAL LOW (ref >90)
Glucose, Bld: 81 mg/dL (ref 70–99)
Potassium: 3.8 mEq/L (ref 3.5–5.1)
Sodium: 137 mEq/L (ref 135–145)

## 2011-06-12 LAB — URINE MICROSCOPIC-ADD ON

## 2011-06-12 MED ORDER — CLOTRIMAZOLE 10 MG MT TROC
10.0000 mg | OROMUCOSAL | Status: AC
  Filled 2011-06-12: qty 1

## 2011-06-12 MED ORDER — SODIUM CHLORIDE 0.9 % IJ SOLN
3.0000 mL | Freq: Two times a day (BID) | INTRAMUSCULAR | Status: DC
  Administered 2011-06-14 – 2011-06-22 (×12): 3 mL via INTRAVENOUS

## 2011-06-12 MED ORDER — ADULT MULTIVITAMIN W/MINERALS CH
1.0000 | ORAL_TABLET | Freq: Every day | ORAL | Status: DC
  Administered 2011-06-13 – 2011-06-22 (×10): 1 via ORAL
  Filled 2011-06-12 (×10): qty 1

## 2011-06-12 MED ORDER — HEPARIN SODIUM (PORCINE) 5000 UNIT/ML IJ SOLN
5000.0000 [IU] | Freq: Three times a day (TID) | INTRAMUSCULAR | Status: DC
  Administered 2011-06-12 – 2011-06-21 (×25): 5000 [IU] via SUBCUTANEOUS
  Filled 2011-06-12 (×32): qty 1

## 2011-06-12 MED ORDER — CLOTRIMAZOLE 10 MG MT TROC
10.0000 mg | Freq: Every day | OROMUCOSAL | Status: DC
  Administered 2011-06-13 – 2011-06-22 (×46): 10 mg via ORAL
  Filled 2011-06-12 (×53): qty 1

## 2011-06-12 MED ORDER — ASPIRIN 81 MG PO CHEW
CHEWABLE_TABLET | ORAL | Status: AC
  Filled 2011-06-12: qty 1

## 2011-06-12 MED ORDER — DEXTROSE 5 % IV SOLN
1.0000 g | Freq: Once | INTRAVENOUS | Status: AC
  Administered 2011-06-12: 1 g via INTRAVENOUS
  Filled 2011-06-12: qty 10

## 2011-06-12 MED ORDER — TACROLIMUS 1 MG PO CAPS
2.0000 mg | ORAL_CAPSULE | Freq: Every day | ORAL | Status: DC
  Administered 2011-06-12 – 2011-06-19 (×7): 2 mg via ORAL
  Filled 2011-06-12 (×3): qty 2

## 2011-06-12 MED ORDER — MIDODRINE HCL 5 MG PO TABS
10.0000 mg | ORAL_TABLET | Freq: Two times a day (BID) | ORAL | Status: DC
  Administered 2011-06-12 – 2011-06-22 (×20): 10 mg via ORAL
  Filled 2011-06-12 (×23): qty 2

## 2011-06-12 MED ORDER — ONDANSETRON HCL 4 MG/2ML IJ SOLN
4.0000 mg | Freq: Once | INTRAMUSCULAR | Status: AC
  Administered 2011-06-12: 4 mg via INTRAVENOUS
  Filled 2011-06-12: qty 2

## 2011-06-12 MED ORDER — CALCITRIOL 0.25 MCG PO CAPS
0.2500 ug | ORAL_CAPSULE | Freq: Every day | ORAL | Status: DC
  Administered 2011-06-13 – 2011-06-22 (×10): 0.25 ug via ORAL
  Filled 2011-06-12 (×11): qty 1

## 2011-06-12 MED ORDER — ASPIRIN EC 81 MG PO TBEC
81.0000 mg | DELAYED_RELEASE_TABLET | Freq: Every day | ORAL | Status: DC
  Administered 2011-06-12 – 2011-06-22 (×11): 81 mg via ORAL
  Filled 2011-06-12 (×11): qty 1

## 2011-06-12 MED ORDER — PREDNISONE 5 MG PO TABS
5.0000 mg | ORAL_TABLET | Freq: Every day | ORAL | Status: DC
  Administered 2011-06-13 – 2011-06-16 (×4): 5 mg via ORAL
  Filled 2011-06-12 (×4): qty 1

## 2011-06-12 MED ORDER — PROMETHAZINE HCL 25 MG PO TABS
25.0000 mg | ORAL_TABLET | Freq: Four times a day (QID) | ORAL | Status: DC | PRN
  Administered 2011-06-13: 25 mg via ORAL
  Filled 2011-06-12: qty 1

## 2011-06-12 MED ORDER — SODIUM CHLORIDE 0.9 % IV BOLUS (SEPSIS)
1000.0000 mL | Freq: Once | INTRAVENOUS | Status: AC
  Administered 2011-06-12: 1000 mL via INTRAVENOUS

## 2011-06-12 MED ORDER — ACETAMINOPHEN 325 MG PO TABS: 650.0000 mg | ORAL_TABLET | Freq: Four times a day (QID) | ORAL | Status: DC | PRN

## 2011-06-12 MED ORDER — AZATHIOPRINE 50 MG PO TABS
100.0000 mg | ORAL_TABLET | ORAL | Status: AC
  Administered 2011-06-12: 100 mg via ORAL
  Filled 2011-06-12: qty 2

## 2011-06-12 MED ORDER — SODIUM CHLORIDE 0.9 % IV SOLN
INTRAVENOUS | Status: DC
  Administered 2011-06-12 – 2011-06-14 (×3): via INTRAVENOUS

## 2011-06-12 MED ORDER — TACROLIMUS 1 MG PO CAPS: 2.0000 mg | ORAL_CAPSULE | Freq: Two times a day (BID) | ORAL | Status: DC

## 2011-06-12 MED ORDER — COLCHICINE 0.6 MG PO TABS
0.6000 mg | ORAL_TABLET | Freq: Every day | ORAL | Status: DC
  Administered 2011-06-12 – 2011-06-14 (×3): 0.6 mg via ORAL
  Filled 2011-06-12 (×4): qty 1

## 2011-06-12 MED ORDER — TACROLIMUS 1 MG PO CAPS
2.0000 mg | ORAL_CAPSULE | ORAL | Status: DC
  Filled 2011-06-12: qty 2

## 2011-06-12 MED ORDER — TACROLIMUS 1 MG PO CAPS
3.0000 mg | ORAL_CAPSULE | Freq: Every day | ORAL | Status: DC
  Administered 2011-06-13 – 2011-06-19 (×7): 3 mg via ORAL
  Filled 2011-06-12 (×2): qty 3

## 2011-06-12 MED ORDER — TACROLIMUS 1 MG PO CAPS: 2.0000 mg | ORAL_CAPSULE | Freq: Every day | ORAL | Status: DC

## 2011-06-12 MED ORDER — PREDNISONE 5 MG PO TABS
5.0000 mg | ORAL_TABLET | ORAL | Status: AC
  Administered 2011-06-12: 5 mg via ORAL
  Filled 2011-06-12: qty 1

## 2011-06-12 MED ORDER — FUROSEMIDE 80 MG PO TABS
80.0000 mg | ORAL_TABLET | Freq: Two times a day (BID) | ORAL | Status: DC
  Administered 2011-06-12 – 2011-06-15 (×7): 80 mg via ORAL
  Filled 2011-06-12 (×9): qty 1

## 2011-06-12 MED ORDER — CALCIUM CARBONATE ANTACID 750 MG PO CHEW: 1.0000 | CHEWABLE_TABLET | ORAL | Status: DC | PRN

## 2011-06-12 MED ORDER — HYDROMORPHONE HCL PF 1 MG/ML IJ SOLN
0.5000 mg | Freq: Once | INTRAMUSCULAR | Status: AC
  Administered 2011-06-12: 0.5 mg via INTRAVENOUS
  Filled 2011-06-12: qty 1

## 2011-06-12 MED ORDER — MORPHINE SULFATE 4 MG/ML IJ SOLN
4.0000 mg | Freq: Once | INTRAMUSCULAR | Status: AC
  Administered 2011-06-12: 4 mg via INTRAVENOUS
  Filled 2011-06-12: qty 1

## 2011-06-12 MED ORDER — HYDROMORPHONE HCL PF 1 MG/ML IJ SOLN
1.0000 mg | INTRAMUSCULAR | Status: DC | PRN
  Administered 2011-06-12 – 2011-06-16 (×6): 1 mg via INTRAVENOUS
  Filled 2011-06-12 (×6): qty 1

## 2011-06-12 MED ORDER — PANTOPRAZOLE SODIUM 40 MG PO TBEC
40.0000 mg | DELAYED_RELEASE_TABLET | Freq: Every day | ORAL | Status: DC
  Administered 2011-06-13 – 2011-06-22 (×11): 40 mg via ORAL
  Filled 2011-06-12 (×8): qty 1

## 2011-06-12 MED ORDER — ZOLPIDEM TARTRATE 5 MG PO TABS
10.0000 mg | ORAL_TABLET | Freq: Every evening | ORAL | Status: DC | PRN
  Administered 2011-06-17: 10 mg via ORAL
  Filled 2011-06-12: qty 2

## 2011-06-12 MED ORDER — SODIUM CHLORIDE 0.9 % IV SOLN
Freq: Once | INTRAVENOUS | Status: AC
  Administered 2011-06-12: 100 mL/h via INTRAVENOUS

## 2011-06-12 MED ORDER — METOCLOPRAMIDE HCL 5 MG PO TABS
5.0000 mg | ORAL_TABLET | Freq: Every day | ORAL | Status: DC
  Administered 2011-06-12 – 2011-06-21 (×10): 5 mg via ORAL
  Filled 2011-06-12 (×11): qty 1

## 2011-06-12 MED ORDER — CALCIUM CARBONATE ANTACID 500 MG PO CHEW
1.5000 | CHEWABLE_TABLET | ORAL | Status: DC | PRN
  Filled 2011-06-12: qty 3

## 2011-06-12 MED ORDER — VALACYCLOVIR HCL 500 MG PO TABS
500.0000 mg | ORAL_TABLET | Freq: Every day | ORAL | Status: DC
  Administered 2011-06-12 – 2011-06-22 (×11): 500 mg via ORAL
  Filled 2011-06-12 (×11): qty 1

## 2011-06-12 MED ORDER — TACROLIMUS 1 MG PO CAPS: 3.0000 mg | ORAL_CAPSULE | Freq: Every day | ORAL | Status: DC

## 2011-06-12 MED ORDER — ATORVASTATIN CALCIUM 10 MG PO TABS
10.0000 mg | ORAL_TABLET | Freq: Every day | ORAL | Status: DC
  Filled 2011-06-12 (×2): qty 1

## 2011-06-12 MED ORDER — AZATHIOPRINE 50 MG PO TABS
100.0000 mg | ORAL_TABLET | Freq: Every day | ORAL | Status: DC
  Administered 2011-06-12 – 2011-06-22 (×11): 100 mg via ORAL
  Filled 2011-06-12 (×11): qty 2

## 2011-06-12 NOTE — ED Notes (Signed)
[facility] to [phone]

## 2011-06-12 NOTE — ED Notes (Signed)
Paged IV team for start.

## 2011-06-12 NOTE — ED Notes (Signed)
Pt to CT at this time.

## 2011-06-12 NOTE — Consult Note (Addendum)
Reason for Consult: ESRD S/P Renal transplant, management of immunosupression Referring Physician: Truxton D Gittleman is an 46 y.o. female.  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 She presented to [facility] with 1 day h/o N/V/right flank pain and fever/chills. She was recently treated for a UTI with sepsis with in-patient and out-patient antibiotics. We were asked see the pt to help further eval/manage her immunosuppressive medications.  She states that she has been able to tolerate PO meds including immunosuppression. She is on azathioprine 100 mg daily, Prograf 3 mg every morning and 2 mg each bedtime and prednisone 5 mg daily.  Past Medical History  Diagnosis Date  . Gastroparesis   . Hx of kidney transplant   . Gout   . Hypotension   . Herpes genitalia   . Stroke      left basal ganglia lacunar infarct; Right frontal lobe lacunar infarct.    Past Surgical History  Procedure Date  . Kidney transplant     X 3  . Appendectomy   . Eye surgery     enuculeation with prothesisi    Family History  Problem Relation Age of Onset  . Hypertension Maternal Grandmother     Social History:  reports that she has quit smoking. Her smoking use included Cigarettes. She smoked .5 packs per day. She has never used smokeless tobacco. She reports that she drinks alcohol. She reports that she uses illicit drugs (Marijuana).  Allergies:  Allergies  Allergen Reactions  . Levofloxacin Rash    Medications:  Scheduled:   . sodium chloride   Intravenous Once  . azaTHIOprine  100 mg Oral Daily  . azaTHIOprine  100 mg Oral To ER  . cefTRIAXone (ROCEPHIN)  IV  1 g Intravenous Once  . clotrimazole  10 mg Oral 5 X Daily  . clotrimazole  10 mg Oral To ER  .  HYDROmorphone (DILAUDID)  injection  0.5 mg Intravenous Once  .  morphine injection  4 mg Intravenous Once  . ondansetron (ZOFRAN) IV  4 mg Intravenous Once  . predniSONE  5 mg Oral Daily  . predniSONE  5 mg Oral To ER  . sodium chloride  1,000 mL Intravenous Once  . tacrolimus  2 mg Oral QHS  . tacrolimus  3 mg Oral Daily  . DISCONTD: tacrolimus  2 mg Oral To ER  . DISCONTD: tacrolimus  2 mg Oral QPC supper  . DISCONTD: tacrolimus  2-3 mg Oral BID  . DISCONTD: tacrolimus  3 mg Oral QAC breakfast    Results for orders placed during the hospital encounter of 06/12/11 (from the past 48 hour(s))  CBC     Status: Abnormal   Collection Time   06/12/11 12:48 PM      Component Value Range Comment   WBC 6.2  4.0 - 10.5 (K/uL)    RBC 2.59 (*) 3.87 - 5.11 (MIL/uL)    Hemoglobin 8.1 (*) 12.0 - 15.0 (g/dL)    HCT 24.2 (*) 36.0 - 46.0 (%)    MCV 93.4  78.0 - 100.0 (fL)    MCH 31.3  26.0 - 34.0 (pg)    MCHC 33.5  30.0 - 36.0 (g/dL)    RDW 20.6 (*) 11.5 - 15.5 (%)    Platelets 171  150 - 400 (K/uL)  DIFFERENTIAL     Status: Abnormal   Collection Time   06/12/11 12:48 PM      Component Value Range Comment   Neutrophils Relative 87 (*) 43 - 77 (%)    Neutro Abs 5.5  1.7 - 7.7 (K/uL)    Lymphocytes Relative 7 (*) 12 - 46 (%)    Lymphs Abs 0.4 (*) 0.7 - 4.0 (K/uL)    Monocytes Relative 5  3 - 12 (%)    Monocytes Absolute 0.3  0.1 - 1.0 (K/uL)    Eosinophils Relative 0  0 - 5 (%)    Eosinophils Absolute 0.0  0.0 - 0.7 (K/uL)    Basophils Relative 0  0 - 1 (%)    Basophils Absolute 0.0  0.0 - 0.1 (K/uL)   BASIC METABOLIC PANEL     Status: Abnormal   Collection Time   06/12/11 12:48 PM      Component Value Range Comment   Sodium 137  135 - 145 (mEq/L)    Potassium 3.8  3.5 - 5.1 (mEq/L)    Chloride 99  96 - 112 (mEq/L)    CO2 27  19 - 32 (mEq/L)    Glucose, Bld 81  70 - 99 (mg/dL)    BUN 25 (*) 6 - 23 (mg/dL)    Creatinine, Ser 1.68 (*) 0.50 - 1.10 (mg/dL)    Calcium 9.7  8.4 - 10.5 (mg/dL)    GFR calc non Af  Amer 36 (*) >90 (mL/min)    GFR calc Af Amer 42 (*) >90 (mL/min)   URINALYSIS, ROUTINE W REFLEX MICROSCOPIC     Status: Abnormal   Collection Time   06/12/11  1:23 PM      Component Value Range Comment   Color, Urine YELLOW  YELLOW     APPearance CLEAR  CLEAR     Specific Gravity, Urine 1.007  1.005 - 1.030     pH 6.0  5.0 - 8.0     Glucose, UA NEGATIVE  NEGATIVE (mg/dL)    Hgb urine dipstick SMALL (*) NEGATIVE     Bilirubin Urine NEGATIVE  NEGATIVE     Ketones, ur NEGATIVE  NEGATIVE (mg/dL)    Protein, ur NEGATIVE  NEGATIVE (mg/dL)    Urobilinogen, UA 0.2  0.0 - 1.0 (mg/dL)    Nitrite NEGATIVE  NEGATIVE     Leukocytes, UA SMALL (*) NEGATIVE    URINE MICROSCOPIC-ADD ON     Status: Normal   Collection Time   06/12/11  1:23 PM      Component Value Range Comment   Squamous Epithelial / LPF RARE  RARE     WBC, UA 11-20  <3 (WBC/hpf)    RBC / HPF 3-6  <3 (RBC/hpf)    Bacteria, UA RARE  RARE      Ct Abdomen Pelvis Wo Contrast  06/12/2011  *RADIOLOGY REPORT*  Clinical Data: Right lower quadrant pain.  History of renal transplant.  CT ABDOMEN AND PELVIS WITHOUT CONTRAST  Technique:  Multidetector CT imaging of the abdomen and pelvis was performed following the standard protocol without intravenous contrast.  Comparison: Transplant ultrasound 05/24/2011. CT chest 10/24/2004.  Findings: There is some atelectatic change in the right lung base. Calcified granulomata are seen in the left lung base.  Scar is noted in the right middle lobe.  No pleural or pericardial effusion. Tortuous retrocrural vasculature is noted and unchanged.  The patient has a right lower quadrant renal transplant.  There is some stranding about  the transplanted kidney but no fluid collection, stone, mass or hydronephrosis is identified.  There is a small ovoid calcification in the right lower quadrant which could be secondary to postoperative change, calcified lymph node or possibly a markedly atrophied renal transplant.  The  native kidneys appear to have been removed.  There is no lymphadenopathy.  The patient has extensive collateral vasculature in the retroperitoneum.  The gallbladder, liver, biliary tree, pancreas and adrenal glands appear normal.  A few small splenic calcifications are seen. The bones demonstrate diffuse osteosclerosis consistent with renal osteodystrophy.  IMPRESSION:  1.  No acute finding.  Right lower quadrant renal transplant is unremarkable in appearance without hydronephrosis or surrounding fluid collection. 2.  Extensive collateral vasculature in the chest and abdomen. 3.  Old granulomatous disease. 4.  Renal osteodystrophy.  Original Report Authenticated By: Arvid Right. Luther Parody, M.D.    Review of Systems  Constitutional: Positive for fever, chills and malaise/fatigue. Negative for weight loss and diaphoresis.  HENT: Negative.  Negative for neck pain.   Eyes: Negative.   Respiratory: Negative.   Cardiovascular: Positive for palpitations. Negative for chest pain, orthopnea, claudication, leg swelling and PND.  Gastrointestinal: Positive for heartburn, nausea and abdominal pain. Negative for vomiting, diarrhea, constipation and blood in stool.       RLQ pain over renal allograft site  Genitourinary: Positive for dysuria and frequency. Negative for urgency, hematuria and flank pain.  Musculoskeletal: Positive for myalgias and back pain. Negative for joint pain and falls.  Skin: Negative.   Neurological: Positive for weakness.  Endo/Heme/Allergies: Negative.   Psychiatric/Behavioral: Negative for hallucinations. The patient is nervous/anxious.   All other systems reviewed and are negative.   Blood pressure 92/44, pulse 87, temperature 100.4 F (38 C), temperature source Oral, resp. rate 20, SpO2 100.00%. Physical Exam  Nursing note and vitals reviewed. Constitutional: She is oriented to person, place, and time. She appears well-developed and well-nourished. No distress.  HENT:  Head:  Normocephalic.  Mouth/Throat: Oropharynx is clear and moist. No oropharyngeal exudate.  Eyes: EOM are normal. Pupils are equal, round, and reactive to light. No scleral icterus.  Neck: Normal range of motion. Neck supple. No JVD present. No tracheal deviation present. No thyromegaly present.  Cardiovascular: Normal rate and regular rhythm.  Exam reveals no gallop and no friction rub.   No murmur heard. Respiratory: Effort normal and breath sounds normal. No stridor. No respiratory distress. She has no wheezes. She has no rales. She exhibits no tenderness.  GI: Soft. Bowel sounds are normal. She exhibits no distension and no mass. There is tenderness. There is guarding. There is no rebound.  Musculoskeletal: Normal range of motion. She exhibits no edema and no tenderness.  Lymphadenopathy:    She has no cervical adenopathy.  Neurological: She is alert and oriented to person, place, and time. No cranial nerve deficit. Coordination normal.  Skin: Skin is warm and dry. No rash noted. She is not diaphoretic. No erythema.  Psychiatric: She has a normal mood and affect. Her behavior is normal.    Assessment/Plan: 1. UTI with renal allograft site pain: Fortunately without any acute findings on CT scan of her abdomen which was done to a certain that she did not have a renal abscess or structural reason for such frequent urinary tract infections/allograft site pain. It is unusual in this era of moderate immunosuppression to present with allograft site pain as a marker of acute rejection. We'll continue to follow closely with you on her current  immunosuppressive regimen without any changes at this time. Agree with liberal intravenous fluids as urinary tract infection is confirmed by the repeat urine cultures and treated with empiric antibiotics. 2. Hypotension: Previous notes noted, she is chronically hypotensive and without any acute drop at this time. Anticipate improvement with intravenous fluid  resuscitation. 3. Anemia: Likely anemia of chronic kidney disease compounded by recent infection/hospitalizations and inflammatory resistance to erythropoietin. Monitor closely for need to start ESA. 4. Gastroparesis: Continue metoclopramide therapy.  Aycen Porreca K. 06/12/2011, 7:46 PM

## 2011-06-12 NOTE — ED Provider Notes (Signed)
History     CSN: [id number]  Arrival date & time 06/12/11  1204   First MD Initiated Contact with Patient 06/12/11 1223      Chief Complaint  Patient presents with  . Fever    transplant patient    (Consider location/radiation/quality/duration/timing/severity/associated sxs/prior treatment) Patient is a 46 y.o. female presenting with fever. The history is provided by the patient.  Fever Primary symptoms of the febrile illness include fever, abdominal pain, nausea and dysuria. Primary symptoms do not include cough or shortness of breath.  The dysuria is associated with urgency.  Associated with: She reports she feels the same as when she had a urinary tract infection recently that required a lengthy hospital stay, per patient.  She has a history of kidney transplant and has pain she associates with where the kidney resides.   Past Medical History  Diagnosis Date  . Gastroparesis   . Hx of kidney transplant   . Gout   . Hypotension   . Herpes genitalia     Past Surgical History  Procedure Date  . Kidney transplant   . Appendectomy   . Eye surgery     Family History  Problem Relation Age of Onset  . Hypertension Maternal Grandmother     History  Substance Use Topics  . Smoking status: Former Smoker -- 0.5 packs/day    Types: Cigarettes  . Smokeless tobacco: Never Used  . Alcohol Use: Yes     rare    OB History    Grav Para Term Preterm Abortions TAB SAB Ect Mult Living                  Review of Systems  Constitutional: Positive for fever.  Respiratory: Negative for cough and shortness of breath.   Gastrointestinal: Positive for nausea and abdominal pain.  Genitourinary: Positive for dysuria and urgency. Negative for vaginal discharge.    Allergies  Levofloxacin  Home Medications   Current Outpatient Rx  Name Route Sig Dispense Refill  . ALPRAZOLAM 0.25 MG PO TABS Oral Take 0.25 mg by mouth 2 (two) times daily as needed. For anxiety    . ASPIRIN  EC 81 MG PO TBEC Oral Take 81 mg by mouth daily.    . AZATHIOPRINE 50 MG PO TABS Oral Take 100 mg by mouth daily.    [name] CALCITRIOL 0.25 MCG PO CAPS Oral Take 0.25 mcg by mouth daily.    [name] CALCIUM CARBONATE ANTACID 750 MG PO CHEW Oral Chew 1-2 tablets by mouth as needed. For stomach    . COLCHICINE 0.6 MG PO TABS Oral Take 0.6 mg by mouth daily.    . EPOETIN ALFA 24401 UNIT/ML IJ SOLN Subcutaneous Inject 40,000 Units into the skin every 30 (thirty) days.     . FUROSEMIDE 40 MG PO TABS Oral Take 80 mg by mouth 2 (two) times daily.     [name] METOCLOPRAMIDE HCL 5 MG PO TABS Oral Take 5 mg by mouth at bedtime.    [name] MIDODRINE HCL 10 MG PO TABS Oral Take 10 mg by mouth 2 (two) times daily.    . ADULT MULTIVITAMIN W/MINERALS CH Oral Take 1 tablet by mouth daily.    [name] OMEPRAZOLE 40 MG PO CPDR Oral Take 40 mg by mouth 2 (two) times daily.     . OXYCODONE HCL 10 MG PO TABS Oral Take 10 mg by mouth 4 (four) times daily as needed. For pain    . OXYCODONE-ACETAMINOPHEN 10-650 MG PO  TABS Oral Take 1 tablet by mouth every 6 (six) hours as needed. For pain.    [name] PREDNISONE 5 MG PO TABS Oral Take 5 mg by mouth daily.    [name] PROMETHAZINE HCL 25 MG PO TABS Oral Take 25 mg by mouth every 6 (six) hours as needed. For nausea     . ROSUVASTATIN CALCIUM 5 MG PO TABS Oral Take 5 mg by mouth daily.    [name] TACROLIMUS 1 MG PO CAPS Oral Take 2-3 mg by mouth 2 (two) times daily. 3 tabs in the morning, 2 tabs in the evening    . VALACYCLOVIR HCL 500 MG PO TABS Oral Take 1 tablet (500 mg total) by mouth daily. 20 tablet 0  . ZOLPIDEM TARTRATE 10 MG PO TABS Oral Take 10 mg by mouth at bedtime as needed. To help sleep.      BP 99/49  Pulse 87  Temp 99 F (37.2 C)  Resp 20  SpO2 100%  Physical Exam  Constitutional: She is oriented to person, place, and time. She appears well-developed and well-nourished.  Cardiovascular: Normal rate.   No murmur heard. Pulmonary/Chest: Effort normal. She has no wheezes. She has no rales.    Abdominal: She exhibits distension. There is tenderness. There is no guarding.       Mild abdominal distention with RLQ tenderness.   Musculoskeletal: She exhibits no edema.  Neurological: She is alert and oriented to person, place, and time.  Skin: Skin is warm and dry.    ED Course  Procedures (including critical care time)  Labs Reviewed  URINALYSIS, ROUTINE W REFLEX MICROSCOPIC - Abnormal; Notable for the following:    Hgb urine dipstick SMALL (*)    Leukocytes, UA SMALL (*)    All other components within normal limits  CBC - Abnormal; Notable for the following:    RBC 2.59 (*)    Hemoglobin 8.1 (*)    HCT 24.2 (*)    RDW 20.6 (*)    All other components within normal limits  DIFFERENTIAL - Abnormal; Notable for the following:    Neutrophils Relative 87 (*)    Lymphocytes Relative 7 (*)    Lymphs Abs 0.4 (*)    All other components within normal limits  BASIC METABOLIC PANEL - Abnormal; Notable for the following:    BUN 25 (*)    Creatinine, Ser 1.68 (*)    GFR calc non Af Amer 36 (*)    GFR calc Af Amer 42 (*)    All other components within normal limits  URINE MICROSCOPIC-ADD ON  URINE CULTURE   No results found. Results for orders placed during the hospital encounter of 06/12/11  URINALYSIS, ROUTINE W REFLEX MICROSCOPIC      Component Value Range   Color, Urine YELLOW  YELLOW    APPearance CLEAR  CLEAR    Specific Gravity, Urine 1.007  1.005 - 1.030    pH 6.0  5.0 - 8.0    Glucose, UA NEGATIVE  NEGATIVE (mg/dL)   Hgb urine dipstick SMALL (*) NEGATIVE    Bilirubin Urine NEGATIVE  NEGATIVE    Ketones, ur NEGATIVE  NEGATIVE (mg/dL)   Protein, ur NEGATIVE  NEGATIVE (mg/dL)   Urobilinogen, UA 0.2  0.0 - 1.0 (mg/dL)   Nitrite NEGATIVE  NEGATIVE    Leukocytes, UA SMALL (*) NEGATIVE   URINE CULTURE      Component Value Range   Specimen Description URINE, CLEAN CATCH     Special Requests NONE  Culture  Setup Time [id number]     Colony Count 30,000  COLONIES/ML     Culture ESCHERICHIA COLI     Report Status 06/14/2011 FINAL     Organism ID, Bacteria ESCHERICHIA COLI    CBC      Component Value Range   WBC 6.2  4.0 - 10.5 (K/uL)   RBC 2.59 (*) 3.87 - 5.11 (MIL/uL)   Hemoglobin 8.1 (*) 12.0 - 15.0 (g/dL)   HCT 24.2 (*) 36.0 - 46.0 (%)   MCV 93.4  78.0 - 100.0 (fL)   MCH 31.3  26.0 - 34.0 (pg)   MCHC 33.5  30.0 - 36.0 (g/dL)   RDW 20.6 (*) 11.5 - 15.5 (%)   Platelets 171  150 - 400 (K/uL)  DIFFERENTIAL      Component Value Range   Neutrophils Relative 87 (*) 43 - 77 (%)   Neutro Abs 5.5  1.7 - 7.7 (K/uL)   Lymphocytes Relative 7 (*) 12 - 46 (%)   Lymphs Abs 0.4 (*) 0.7 - 4.0 (K/uL)   Monocytes Relative 5  3 - 12 (%)   Monocytes Absolute 0.3  0.1 - 1.0 (K/uL)   Eosinophils Relative 0  0 - 5 (%)   Eosinophils Absolute 0.0  0.0 - 0.7 (K/uL)   Basophils Relative 0  0 - 1 (%)   Basophils Absolute 0.0  0.0 - 0.1 (K/uL)  BASIC METABOLIC PANEL      Component Value Range   Sodium 137  135 - 145 (mEq/L)   Potassium 3.8  3.5 - 5.1 (mEq/L)   Chloride 99  96 - 112 (mEq/L)   CO2 27  19 - 32 (mEq/L)   Glucose, Bld 81  70 - 99 (mg/dL)   BUN 25 (*) 6 - 23 (mg/dL)   Creatinine, Ser 1.68 (*) 0.50 - 1.10 (mg/dL)   Calcium 9.7  8.4 - 10.5 (mg/dL)   GFR calc non Af Amer 36 (*) >90 (mL/min)   GFR calc Af Amer 42 (*) >90 (mL/min)  URINE MICROSCOPIC-ADD ON      Component Value Range   Squamous Epithelial / LPF RARE  RARE    WBC, UA 11-20  <3 (WBC/hpf)   RBC / HPF 3-6  <3 (RBC/hpf)   Bacteria, UA RARE  RARE   CULTURE, BLOOD (ROUTINE X 2)      Component Value Range   Specimen Description BLOOD ARM LEFT     Special Requests BOTTLES DRAWN AEROBIC ONLY 5CC     Culture  Setup Time [id number]     Culture       Value: ESCHERICHIA COLI     Note: Gram Stain Report Called to,Read Back By and Verified With: RN D. BEASON ON 06/13/11 AT 1905 BY DTERRY   Report Status 06/15/2011 FINAL     Organism ID, Bacteria ESCHERICHIA COLI    CULTURE, BLOOD  (ROUTINE X 2)      Component Value Range   Specimen Description BLOOD HAND RIGHT     Special Requests BOTTLES DRAWN AEROBIC AND ANAEROBIC 5CC     Culture  Setup Time [id number]     Culture       Value:        BLOOD CULTURE RECEIVED NO GROWTH TO DATE CULTURE WILL BE HELD FOR 5 DAYS BEFORE ISSUING A FINAL NEGATIVE REPORT   Report Status PENDING    CBC      Component Value Range   WBC 4.9  4.0 - 10.5 (K/uL)  RBC 2.50 (*) 3.87 - 5.11 (MIL/uL)   Hemoglobin 7.9 (*) 12.0 - 15.0 (g/dL)   HCT 23.7 (*) 36.0 - 46.0 (%)   MCV 94.8  78.0 - 100.0 (fL)   MCH 31.6  26.0 - 34.0 (pg)   MCHC 33.3  30.0 - 36.0 (g/dL)   RDW 20.6 (*) 11.5 - 15.5 (%)   Platelets 138 (*) 150 - 400 (K/uL)  CREATININE, SERUM      Component Value Range   Creatinine, Ser 1.80 (*) 0.50 - 1.10 (mg/dL)   GFR calc non Af Amer 33 (*) >90 (mL/min)   GFR calc Af Amer 39 (*) >90 (mL/min)  MAGNESIUM      Component Value Range   Magnesium 1.6  1.5 - 2.5 (mg/dL)  PHOSPHORUS      Component Value Range   Phosphorus 3.9  2.3 - 4.6 (mg/dL)  CBC      Component Value Range   WBC 5.7  4.0 - 10.5 (K/uL)   RBC 2.39 (*) 3.87 - 5.11 (MIL/uL)   Hemoglobin 7.4 (*) 12.0 - 15.0 (g/dL)   HCT 22.3 (*) 36.0 - 46.0 (%)   MCV 93.3  78.0 - 100.0 (fL)   MCH 31.0  26.0 - 34.0 (pg)   MCHC 33.2  30.0 - 36.0 (g/dL)   RDW 20.9 (*) 11.5 - 15.5 (%)   Platelets 149 (*) 150 - 400 (K/uL)  DIFFERENTIAL      Component Value Range   Neutrophils Relative 88 (*) 43 - 77 (%)   Neutro Abs 5.0  1.7 - 7.7 (K/uL)   Lymphocytes Relative 5 (*) 12 - 46 (%)   Lymphs Abs 0.3 (*) 0.7 - 4.0 (K/uL)   Monocytes Relative 7  3 - 12 (%)   Monocytes Absolute 0.4  0.1 - 1.0 (K/uL)   Eosinophils Relative 0  0 - 5 (%)   Eosinophils Absolute 0.0  0.0 - 0.7 (K/uL)   Basophils Relative 0  0 - 1 (%)   Basophils Absolute 0.0  0.0 - 0.1 (K/uL)  COMPREHENSIVE METABOLIC PANEL      Component Value Range   Sodium 139  135 - 145 (mEq/L)   Potassium 4.5  3.5 - 5.1 (mEq/L)    Chloride 102  96 - 112 (mEq/L)   CO2 23  19 - 32 (mEq/L)   Glucose, Bld 91  70 - 99 (mg/dL)   BUN 28 (*) 6 - 23 (mg/dL)   Creatinine, Ser 1.91 (*) 0.50 - 1.10 (mg/dL)   Calcium 8.8  8.4 - 10.5 (mg/dL)   Total Protein 5.7 (*) 6.0 - 8.3 (g/dL)   Albumin 3.0 (*) 3.5 - 5.2 (g/dL)   AST 23  0 - 37 (U/L)   ALT 19  0 - 35 (U/L)   Alkaline Phosphatase 77  39 - 117 (U/L)   Total Bilirubin 0.7  0.3 - 1.2 (mg/dL)   GFR calc non Af Amer 31 (*) >90 (mL/min)   GFR calc Af Amer 36 (*) >90 (mL/min)  FERRITIN      Component Value Range   Ferritin 1533 (*) 10 - 291 (ng/mL)  IRON AND TIBC      Component Value Range   Iron <10 (*) 42 - 135 (ug/dL)   TIBC Not calculated due to Iron <10.  250 - 470 (ug/dL)   Saturation Ratios Not calculated due to Iron <10.  20 - 55 (%)   UIBC 155  125 - 400 (ug/dL)  CBC  Component Value Range   WBC 3.8 (*) 4.0 - 10.5 (K/uL)   RBC 2.47 (*) 3.87 - 5.11 (MIL/uL)   Hemoglobin 7.7 (*) 12.0 - 15.0 (g/dL)   HCT 23.0 (*) 36.0 - 46.0 (%)   MCV 93.1  78.0 - 100.0 (fL)   MCH 31.2  26.0 - 34.0 (pg)   MCHC 33.5  30.0 - 36.0 (g/dL)   RDW 20.4 (*) 11.5 - 15.5 (%)   Platelets 151  150 - 400 (K/uL)  MAGNESIUM      Component Value Range   Magnesium 1.7  1.5 - 2.5 (mg/dL)  RENAL FUNCTION PANEL      Component Value Range   Sodium 141  135 - 145 (mEq/L)   Potassium 3.5  3.5 - 5.1 (mEq/L)   Chloride 106  96 - 112 (mEq/L)   CO2 23  19 - 32 (mEq/L)   Glucose, Bld 107 (*) 70 - 99 (mg/dL)   BUN 27 (*) 6 - 23 (mg/dL)   Creatinine, Ser 1.85 (*) 0.50 - 1.10 (mg/dL)   Calcium 9.2  8.4 - 10.5 (mg/dL)   Phosphorus 2.7  2.3 - 4.6 (mg/dL)   Albumin 3.0 (*) 3.5 - 5.2 (g/dL)   GFR calc non Af Amer 32 (*) >90 (mL/min)   GFR calc Af Amer 37 (*) >90 (mL/min)  RENAL FUNCTION PANEL      Component Value Range   Sodium 140  135 - 145 (mEq/L)   Potassium 3.9  3.5 - 5.1 (mEq/L)   Chloride 105  96 - 112 (mEq/L)   CO2 24  19 - 32 (mEq/L)   Glucose, Bld 76  70 - 99 (mg/dL)   BUN 23  6 - 23  (mg/dL)   Creatinine, Ser 2.19 (*) 0.50 - 1.10 (mg/dL)   Calcium 9.5  8.4 - 10.5 (mg/dL)   Phosphorus 3.4  2.3 - 4.6 (mg/dL)   Albumin 3.2 (*) 3.5 - 5.2 (g/dL)   GFR calc non Af Amer 26 (*) >90 (mL/min)   GFR calc Af Amer 31 (*) >90 (mL/min)  CBC      Component Value Range   WBC 3.5 (*) 4.0 - 10.5 (K/uL)   RBC 2.46 (*) 3.87 - 5.11 (MIL/uL)   Hemoglobin 7.6 (*) 12.0 - 15.0 (g/dL)   HCT 23.1 (*) 36.0 - 46.0 (%)   MCV 93.9  78.0 - 100.0 (fL)   MCH 30.9  26.0 - 34.0 (pg)   MCHC 32.9  30.0 - 36.0 (g/dL)   RDW 20.7 (*) 11.5 - 15.5 (%)   Platelets 166  150 - 400 (K/uL)     No diagnosis found. 1. UTI 2. Fever 3. H/o renal transplant   MDM  She has some evidence of UTI without evidence of sepsis. Will opt to admit given her history of severe infection and renal transplant. Discussed with Dr. Jimmy Footman - medicine to admit.         Leotis Shames, PA-C 06/15/11 1612

## 2011-06-12 NOTE — H&P (Signed)
Family Medicine Teaching Service HISTORY & PHYSICAL   Patient name: Tina Watkins Medical record number: [id number] Date of birth: 05/01/1965 Age: 46 y.o. Gender: female  Primary Care Provider: [name], MD, MD   Chief Complaint: Abdominal Pain & Fever    HPI  Tina Watkins is a 46 y.o. year old female presenting with 1 day history of RLQ abdominal pain located over her Renal Transplant Graft Site.  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  She was recently hospitalized for UTI with Bacteremia and Sepsis but had completely recovered from this episode.  She also was recently diagnosed with Herpes and was started on Valtrex.    Her abdominal pain is intermittently severe and she reports associated nausea and non-bloody/non-bilious emesis 2-3Xs per today.  She also has some loose stool.  Fever to 101.3 at home with no anti-pyretics taken.  No other meds taken.  Pain is similar to prior transplant rejections but not as severe.  She does report frequency and urgency but no dysuria   ROS   Constitutional Worsening fatigue she attributes to missing a dose of her prograft  Infectious As above; ?sick contact  Resp No cough, no congestion  Cardiac No chest pain, no palpitations  GI N/V + loose stools as above  MEDS Good compliance with exception of her prograf that she is ~1 week behind               HISTORY:  PMHx:  Past Medical History  Diagnosis Date  . Gastroparesis   . Hx of kidney transplant   . Gout   . Hypotension   . Herpes genitalia   . Stroke      left basal ganglia lacunar infarct; Right frontal lobe lacunar infarct.    PSHx: Past Surgical History  Procedure Date  . Kidney transplant     X 3  . Appendectomy   . Eye surgery     enuculeation with prothesisi    Social Hx: History   Social History Narrative  . No narrative on file    Family Hx: Family History  Problem Relation Age of Onset  . Hypertension Maternal  Grandmother     Allergies: Allergies  Allergen Reactions  . Levofloxacin Rash    Home Medications: Prior to Admission medications   Medication Sig Start Date End Date Taking? Authorizing Provider  ALPRAZolam (XANAX) 0.25 MG tablet Take 0.25 mg by mouth 2 (two) times daily as needed. For anxiety   Yes Historical Provider, MD  aspirin EC 81 MG tablet Take 81 mg by mouth daily.   Yes Historical Provider, MD  azaTHIOprine (IMURAN) 50 MG tablet Take 100 mg by mouth daily.   Yes Historical Provider, MD  calcitRIOL (ROCALTROL) 0.25 MCG capsule Take 0.25 mcg by mouth daily.   Yes Historical Provider, MD  calcium carbonate (TUMS EX) 750 MG chewable tablet Chew 1-2 tablets by mouth as needed. For stomach   Yes Historical Provider, MD  colchicine 0.6 MG tablet Take 0.6 mg by mouth daily.   Yes Historical Provider, MD  epoetin alfa (EPOGEN,PROCRIT) 29562 UNIT/ML injection Inject 40,000 Units into the skin every 30 (thirty) days.    Yes Historical Provider, MD  furosemide (LASIX) 40 MG tablet Take 80 mg by mouth 2 (two) times daily.    Yes Historical Provider, MD  metoCLOPramide (REGLAN) 5 MG tablet Take 5 mg by mouth at bedtime. 02/23/11 02/23/12 Yes Milus Banister, MD  midodrine ([place]) 10  MG tablet Take 10 mg by mouth 2 (two) times daily.   Yes Historical Provider, MD  Multiple Vitamin (MULITIVITAMIN WITH MINERALS) TABS Take 1 tablet by mouth daily.   Yes Historical Provider, MD  omeprazole (PRILOSEC) 40 MG capsule Take 40 mg by mouth 2 (two) times daily.    Yes Historical Provider, MD  Oxycodone HCl 10 MG TABS Take 10 mg by mouth 4 (four) times daily as needed. For pain   Yes Historical Provider, MD  oxyCODONE-acetaminophen (PERCOCET) 10-650 MG per tablet Take 1 tablet by mouth every 6 (six) hours as needed. For pain.   Yes Historical Provider, MD  predniSONE (DELTASONE) 5 MG tablet Take 5 mg by mouth daily.   Yes Historical Provider, MD  promethazine (PHENERGAN) 25 MG tablet Take 25 mg by  mouth every 6 (six) hours as needed. For nausea    Yes Historical Provider, MD  rosuvastatin (CRESTOR) 5 MG tablet Take 5 mg by mouth daily.   Yes Historical Provider, MD  tacrolimus (PROGRAF) 1 MG capsule Take 2-3 mg by mouth 2 (two) times daily. 3 tabs in the morning, 2 tabs in the evening   Yes Historical Provider, MD  valACYclovir (VALTREX) 500 MG tablet Take 1 tablet (500 mg total) by mouth daily. 06/02/11 06/16/11 Yes Blanchie Dessert, MD  zolpidem (AMBIEN) 10 MG tablet Take 10 mg by mouth at bedtime as needed. To help sleep.   Yes Historical Provider, MD             OBJECTIVE  Vitals: Patient Vitals for the past 24 hrs:  BP Temp Pulse Resp SpO2  06/12/11 1211 99/49 mmHg 99 F (37.2 C) 87  20  100 %   Wt Readings from Last 3 Encounters:  05/29/11 135 lb 5.8 oz (61.4 kg)  02/23/11 131 lb (59.421 kg)  07/26/09 112 lb (50.803 kg)   No intake or output data in the 24 hours ending 06/12/11 1849  PE: GENERAL:  Adult AA female.  Examined in [facility] ED.  Laying in bed  In mild discomfort; norespiratory distress.   PSYCH: Alert and appropriately interactive; Insight:Good  Alert, oriented, thought content appropriate, alertness: alert, thought content exhibits logical connections although some what bizarre affect H&N: AT/[place], MMM, no scleral icterus, EOMi, mild swelling of face bilaterally THORAX: HEART: RRR, S1/S2 heard, 2+/6 systolic murmur over R sternal boarder LUNGS: CTA B, no wheezes, no crackles ABDOMEN:  +BS, soft, non-tender, no rigidity, no guarding, no masses/organomegaly EXTREMITIES: Moves all 4 extremities spontaneously, warm well perfused, no edema, bilateral DP and PT pulses 2+/4. Extreme tenderness to touch over the L 1st MTP >NEURO: No lateralization in UE or LE strength; negative pronator drift; chronic facial droop but at baseline according to companion in room; CN II-XII intact  LABS: LABS:  Basename 06/12/11 1248 05/30/11 0550 05/29/11 0733 05/28/11 0620 05/27/11 0540  05/25/11 0830 05/24/11 0455 05/23/11 0430 05/22/11 1238 04/07/11 1433  WBC 6.2 6.6 5.7 5.2 5.1 7.7 12.3* 12.9* 10.0 --  HGB 8.1* 7.9* 7.6* 7.9* 8.5* 8.5* 8.5* 9.3* 11.0* 10.4*  HCT 24.2* 23.7* 22.2* 23.6* 25.0* 25.3* 25.0* 27.2* 31.8* --  PLT 171 254 227 208 170 142* 131* 126* clumped --    Basename 06/12/11 1248 05/30/11 0555 05/29/11 0733 05/28/11 0620 05/27/11 1900 05/27/11 0540 05/26/11 1033 05/25/11 0830 05/24/11 0455 05/23/11 0430  NA 137 134* 137 136 138 137 136 133* 136 135  K 3.8 4.5 4.4 4.7 4.2 3.8 3.7 4.3 4.6 4.2  CL 99 105  107 107 107 106 106 102 105 105  CO2 27 20 21 20 20 19 19  18* 19 19  BUN 25* 33* 34* 37* 41* 54* 66* 69* 52* 40*  CREATININE 1.68* 2.20* 2.32* 2.19* 2.32* 2.68* 3.50* 4.15* 3.57* 3.10*  CALCIUM 9.7 9.4 9.2 9.2 9.2 9.5 9.5 9.4 9.1 8.8  GLUCOSE 81 82 84 108* 103* 75 96 100* [id number]* 123456*    Metabolic:  Basename [id number] 0555 05/29/11 0733 05/28/11 0620  MG -- -- --  PHOS 2.7 2.4 3.8    Basename 05/25/11 0830 05/22/11 1238  ALT 16 11  AST 33 30  ALKPHOS 131* 75  BILITOT 0.2* 1.3*  BILIDIR -- --    Basename 05/30/11 0555 05/29/11 0733 05/28/11 0620 05/25/11 0830 05/22/11 1238  PROT -- -- -- 6.3 6.7  ALBUMIN 3.1* 2.9* 3.1* -- --  PREALBUMIN -- -- -- -- --     Basename 05/22/11 1952  GLUCAP 119*     Basename 04/07/11 1437 03/06/11 1308 02/06/11 1503 01/13/11 1243  IRON 64 94 60 --  TIBC 175* 212* 206* --  FERRITIN 1209* -- 1493* 1404*      Hematology:  [facility] 06/12/11 1248 05/22/11 1238  LYMPHSABS 0.4* 0.3*  MONOABS 0.3 0.5  EOSABS 0.0 0.0  BASOSABS 0.0 0.0    Basename 06/12/11 1248 05/30/11 0550 05/29/11 0733  RDW 20.6* 20.6* 20.1*  MCV 93.4 93.3 92.9  MCHC 33.5 33.3 34.2  MRET -- -- --   Other Labs:  Basename 05/23/11 0800 05/22/11 2319 05/22/11 1635  CKTOTAL 58 87 81  TROPONINI <0.30 <0.30 <0.30  TROPONINT -- -- --  CKMBINDEX -- -- --    Basename 05/22/11 1238  AMYLASE --  LIPASE 20     Basename 05/22/11 1238  05/22/11 1236  LATICACIDVEN -- 2.8*  PROCALCITON 46.29 --    Basename 05/23/11 0630 05/23/11 0600 05/22/11 2325 05/22/11 1823  PHART 7.414* -- -- 7.390  PCO2ART 27.9* -- -- 30.6*  PO2ART 50.4* -- -- 88.0  HCO3 17.5* 17.3* -- 18.5*  TCO2 18.3 18 -- 19  ACIDBASEDEF 6.2* 7.0* -- 6.0*  O2SAT 87.9 67.0 73.0 --   Urinalysis  Basename 06/12/11 1323 05/23/11 1316 05/22/11 1323  COLORURINE YELLOW YELLOW AMBER*  APPEARANCEUR CLEAR TURBID* CLOUDY*  LABSPEC 1.007 1.015 1.014  PHURINE 6.0 5.5 6.5  GLUCOSEU NEGATIVE NEGATIVE NEGATIVE  HGBUR SMALL* SMALL* LARGE*  BILIRUBINUR NEGATIVE NEGATIVE SMALL*  KETONESUR NEGATIVE NEGATIVE 15*  PROTEINUR NEGATIVE 100* >300*  UROBILINOGEN 0.2 0.2 1.0  NITRITE NEGATIVE NEGATIVE NEGATIVE  LEUKOCYTESUR SMALL* SMALL* LARGE*    MICRO: < > 5/10 urine Culture < > 5/10 Blood Culture X 2   IMAGING: None in ED            Assessment & Plan  46 y.o. year old female with ESRD s/p 3 Renal Transplants presenting with RLQ pain over transplant site and reported fever at home.  Associated nausea/vomiting.  1. ABDOMINAL PAIN  - Acute onset as of day of admission.  Located over RLQ at transplant site.  Pt reports this pain is not like her pain with prior transplants or with her last serious UTI.  Pain may be secondary to viral syndrome vs. UTI vs. Immunosuppression in Renal Transplant patient.  [facility] Course   *Zofran *Phergan *Reglan *Dilaudid  No imaging indicated at time of admission as found to have UTI  After discussing case with Dr. Posey Pronto decision to obtain CT Abdomen/Pelvis to asses for abscess  Plan / Followup  [ ]  f/u CT abdomen/pelvis - Consider renal imaging per Nephrology     2. ID & IMMUNOLOGY: (Immunosuppression in Renal Transplant Pt, Herpes Simplex, Urinary Tract Infection, Oral thrush) - Pt on chronic immunosuppressive medications for renal transplant.  Concern is raised due to diminished  immune response and may be late to mount an appropriate leukocytosis.  Given recurrent UTI will obtain CT as above to assess for abscess  [facility] Course   *Rocephin *Valtrex *Azathioprine *Tacrolimus *Prednisone *Clotrimazole  Pt continued on home regimen at time of admission  > SMALL LEUKOCYTE ESTERASE; Started on rocephin for UTI given prior history and low threshold                                      Plan / Followup  < > Urine Culture >>> < > Blood Cultures X 2 >>> - Consider chronic rejection; appreciate Renal's guidance     3. Renal: (ESRD) - No evidence of AKI; Cr appears to be improved from prior hospitalization  [facility] Course   *Calcitriol *Lasix 80mg  bid  Cr @ admission 1.68  Specific Gravity 1.006; but appears clinically dry  Dr. Posey Pronto with [place] consulted                                       Plan / Followup  [ ]  F/u plan from Dr. Posey Pronto    4. GOUT:  - Pt with long standing history; received injection in her L 1st MTP earlier in week of admission  [facility] Course   *Colchicine  Plan to continue home regimen                                      Plan / Followup  Monitor    5. CV: (Hypotension, remove hx of CVA) - Pt BP stable at time of admission; provided fluid bolus and continued home midrodrine - no evidence of CVA on initial exam and pt doesn't report any worsening in neuro function but does report she is concerned her problems could be from a stroke  [place] / Followup  Continue to monitor fluid status and BP   Closely Continue to monitor neuro status prn as no focal signs on exam    6. Chronic Stable medical conditions: (GERD, Sleep Disorder, HLD, Anxiety) - Pt was started on Xanax at last hospitalization.  Will hold at admission, if symptomatic will consider longer acting benzo (i.e. Ativan) - On home  Crestor > Lipitor on [place] / Followup  Consider Ativan for Anxiety   --- FEN (Dehydration) *NS @ 141ml/hr s/p 1L Bolus in ED -Renal ---  PPx: Heparin & PPI            DISPOSITION  Will place on telemetry and monitor for worsening signs of infection or graft rejection.  Will obtain CT abdomen/pelvis to asses for renal abscess.  Placed on Rocephin; if worsening clinical picture will broaden and will pursue sepsis workup.  Concerning also for potential rejection.    Gerda Diss, DO [facility] Resident - PGY-1 06/12/2011 6:49 PM   Patient seen, examined. Available data reviewed. Agree with findings, assessment, and plan as outlined by Dr. Paulla Fore.  My additional findings are documented and highlighted above.  [employer], DO 06/12/2011 10:46 PM

## 2011-06-12 NOTE — ED Notes (Signed)
Pt states she was recently treated in mid April for e-coli UTI.  Pt reports it feels like she has a UTI again.

## 2011-06-12 NOTE — ED Notes (Signed)
IV team came and not able to start IV.  RN is going to have another nurse come try to start a line.

## 2011-06-12 NOTE — ED Notes (Signed)
Attempted to call report, sec. St[facility] receiving nurse is in report and will call me back.

## 2011-06-12 NOTE — ED Notes (Signed)
Patient reports fever and malaise, right flank pain, patient also with c/o urinary retention

## 2011-06-12 NOTE — Progress Notes (Addendum)
Family Medicine Teaching Service Brief Progress Note: [id number] Came to check on [name]. She states that her right sided pain is improving. She has been trying to eat jellow. Drinking fine. No nausea or vomiting. She received one dose of dilaudid in the evening. Filed Vitals:   06/12/11 1211 06/12/11 1850 06/12/11 2027  BP: 99/49 92/44 96/49   Pulse: 87  80  Temp: 99 F (37.2 C) 100.4 F (38 C) 100.1 F (37.8 C)  TempSrc:  Oral Oral  Resp: 20  20  Weight:   126 lb 1.7 oz (57.2 kg)  SpO2: 100% 100% 100%    On review of her vitals, she had a temperature of 100.4 around 7pm. Repeat temp without any anitpyretics was 100.1. Patient currently on rocephin given presence of small leuks in urine and immunosuppression. Urine and blood cultures pending at this time. Repeat WBC was 4.9 but patient is on immunosuppressive therapy for kidney transplant.  She does not meet SIRS criteria at this time. Will monitor closely overnight. Repeat CBC in am.

## 2011-06-13 LAB — CBC
HCT: 22.3 % — ABNORMAL LOW (ref 36.0–46.0)
Hemoglobin: 7.4 g/dL — ABNORMAL LOW (ref 12.0–15.0)
MCH: 31 pg (ref 26.0–34.0)
MCHC: 33.2 g/dL (ref 30.0–36.0)
MCV: 93.3 fL (ref 78.0–100.0)
Platelets: 149 10*3/uL — ABNORMAL LOW (ref 150–400)
RBC: 2.39 MIL/uL — ABNORMAL LOW (ref 3.87–5.11)
RDW: 20.9 % — ABNORMAL HIGH (ref 11.5–15.5)
WBC: 5.7 10*3/uL (ref 4.0–10.5)

## 2011-06-13 LAB — DIFFERENTIAL
Basophils Absolute: 0 10*3/uL (ref 0.0–0.1)
Basophils Relative: 0 % (ref 0–1)
Eosinophils Absolute: 0 10*3/uL (ref 0.0–0.7)
Eosinophils Relative: 0 % (ref 0–5)
Lymphocytes Relative: 5 % — ABNORMAL LOW (ref 12–46)
Lymphs Abs: 0.3 10*3/uL — ABNORMAL LOW (ref 0.7–4.0)
Monocytes Absolute: 0.4 10*3/uL (ref 0.1–1.0)
Monocytes Relative: 7 % (ref 3–12)
Neutro Abs: 5 10*3/uL (ref 1.7–7.7)
Neutrophils Relative %: 88 % — ABNORMAL HIGH (ref 43–77)

## 2011-06-13 LAB — MAGNESIUM: Magnesium: 1.6 mg/dL (ref 1.5–2.5)

## 2011-06-13 LAB — IRON AND TIBC
Iron: <10 ug/dL — ABNORMAL LOW (ref 42–135)
UIBC: 155 ug/dL (ref 125–400)

## 2011-06-13 LAB — COMPREHENSIVE METABOLIC PANEL
ALT: 19 U/L (ref 0–35)
AST: 23 U/L (ref 0–37)
Albumin: 3 g/dL — ABNORMAL LOW (ref 3.5–5.2)
Alkaline Phosphatase: 77 U/L (ref 39–117)
BUN: 28 mg/dL — ABNORMAL HIGH (ref 6–23)
CO2: 23 mEq/L (ref 19–32)
Calcium: 8.8 mg/dL (ref 8.4–10.5)
Chloride: 102 mEq/L (ref 96–112)
Creatinine, Ser: 1.91 mg/dL — ABNORMAL HIGH (ref 0.50–1.10)
GFR calc Af Amer: 36 mL/min — ABNORMAL LOW (ref >90)
GFR calc non Af Amer: 31 mL/min — ABNORMAL LOW (ref >90)
Glucose, Bld: 91 mg/dL (ref 70–99)
Potassium: 4.5 mEq/L (ref 3.5–5.1)
Sodium: 139 mEq/L (ref 135–145)
Total Bilirubin: 0.7 mg/dL (ref 0.3–1.2)
Total Protein: 5.7 g/dL — ABNORMAL LOW (ref 6.0–8.3)

## 2011-06-13 LAB — FERRITIN: Ferritin: 1533 ng/mL — ABNORMAL HIGH (ref 10–291)

## 2011-06-13 LAB — PHOSPHORUS: Phosphorus: 3.9 mg/dL (ref 2.3–4.6)

## 2011-06-13 MED ORDER — SODIUM CHLORIDE 0.9 % IV BOLUS (SEPSIS): 500.0000 mL | Freq: Once | INTRAVENOUS | Status: DC

## 2011-06-13 MED ORDER — DEXTROSE 5 % IV SOLN
1.0000 g | INTRAVENOUS | Status: DC
  Administered 2011-06-13 – 2011-06-14 (×2): 1 g via INTRAVENOUS
  Filled 2011-06-13 (×2): qty 10

## 2011-06-13 MED ORDER — DARBEPOETIN ALFA-POLYSORBATE 100 MCG/0.5ML IJ SOLN
100.0000 ug | INTRAMUSCULAR | Status: DC
  Administered 2011-06-13 – 2011-06-20 (×2): 100 ug via SUBCUTANEOUS
  Filled 2011-06-13 (×3): qty 0.5

## 2011-06-13 NOTE — Progress Notes (Signed)
FPTS PROGRESS NOTE  Subjective: Still feels very weak.  Did not want entire 500cc bolus this AM b/c face feels very swollen.  Pain is better, no nausea, but poor appetite and generalized weakness.  States that she would like her Aranesp dose while here b/c she forgot to go to her appt last week at Short Stay to receive it.   Objective: Vital signs in last 24 hours: Temp:  [98.6 F (37 C)-100.4 F (38 C)] 98.6 F (37 C) (05/11 0441) Pulse Rate:  [67-87] 67  (05/11 0441) Resp:  [18-20] 18  (05/11 0441) BP: (79-99)/(44-49) 82/46 mmHg (05/11 0503) SpO2:  [100 %] 100 % (05/11 0441) Weight:  [126 lb 1.7 oz (57.2 kg)] 126 lb 1.7 oz (57.2 kg) (05/10 2027) Weight change:  Last BM Date: 06/12/11  Intake/Output from previous day: 05/10 0701 - 05/11 0700 In: 1115 [P.O.:240; I.V.:775; IV Piggyback:100] Out: -  Intake/Output this shift:    Physical Exam: Gen: NAD, laying in bed HEENT: MMM, obvious facial edema CV: RRR Pulm: CTA bilaterally, no wheezes or crackles Abd: soft, normal BS, mild TTP RLQ Ext: warm, no LE edema  Lab Results:  [facility] 06/13/11 0520 06/12/11 2128  WBC 5.7 4.9  HGB 7.4* 7.9*  HCT 22.3* 23.7*  PLT 149* 138*   BMET  Basename 06/13/11 0520 06/12/11 2128 06/12/11 1248  NA 139 -- 137  K 4.5 -- 3.8  CL 102 -- 99  CO2 23 -- 27  GLUCOSE 91 -- 81  BUN 28* -- 25*  CREATININE 1.91* 1.80* --  CALCIUM 8.8 -- 9.7    Studies/Results: Ct Abdomen Pelvis Wo Contrast  06/12/2011  *RADIOLOGY REPORT*  Clinical Data: Right lower quadrant pain.  History of renal transplant.  CT ABDOMEN AND PELVIS WITHOUT CONTRAST  Technique:  Multidetector CT imaging of the abdomen and pelvis was performed following the standard protocol without intravenous contrast.  Comparison: Transplant ultrasound 05/24/2011. CT chest 10/24/2004.  Findings: There is some atelectatic change in the right lung base. Calcified granulomata are seen in the left lung base.  Scar is noted in the right middle  lobe.  No pleural or pericardial effusion. Tortuous retrocrural vasculature is noted and unchanged.  The patient has a right lower quadrant renal transplant.  There is some stranding about the transplanted kidney but no fluid collection, stone, mass or hydronephrosis is identified.  There is a small ovoid calcification in the right lower quadrant which could be secondary to postoperative change, calcified lymph node or possibly a markedly atrophied renal transplant.  The native kidneys appear to have been removed.  There is no lymphadenopathy.  The patient has extensive collateral vasculature in the retroperitoneum.  The gallbladder, liver, biliary tree, pancreas and adrenal glands appear normal.  A few small splenic calcifications are seen. The bones demonstrate diffuse osteosclerosis consistent with renal osteodystrophy.  IMPRESSION:  1.  No acute finding.  Right lower quadrant renal transplant is unremarkable in appearance without hydronephrosis or surrounding fluid collection. 2.  Extensive collateral vasculature in the chest and abdomen. 3.  Old granulomatous disease. 4.  Renal osteodystrophy.  Original Report Authenticated By: Arvid Right. Luther Parody, M.D.    Medications:  I have reviewed the patient's current medications. Scheduled:   . sodium chloride   Intravenous Once  . aspirin EC  81 mg Oral Daily  . atorvastatin  10 mg Oral q1800  . azaTHIOprine  100 mg Oral Daily  . azaTHIOprine  100 mg Oral To ER  . calcitRIOL  0.25 mcg Oral Daily  . cefTRIAXone (ROCEPHIN)  IV  1 g Intravenous Once  . clotrimazole  10 mg Oral 5 X Daily  . clotrimazole  10 mg Oral To ER  . colchicine  0.6 mg Oral Daily  . furosemide  80 mg Oral BID  . heparin  5,000 Units Subcutaneous Q8H  .  HYDROmorphone (DILAUDID) injection  0.5 mg Intravenous Once  . metoCLOPramide  5 mg Oral QHS  . midodrine  10 mg Oral BID  .  morphine injection  4 mg Intravenous Once  . mulitivitamin with minerals  1 tablet Oral Daily  .  ondansetron (ZOFRAN) IV  4 mg Intravenous Once  . pantoprazole  40 mg Oral Q1200  . predniSONE  5 mg Oral Daily  . predniSONE  5 mg Oral To ER  . sodium chloride  1,000 mL Intravenous Once  . sodium chloride  500 mL Intravenous Once  . sodium chloride  3 mL Intravenous Q12H  . tacrolimus  2 mg Oral QHS  . tacrolimus  3 mg Oral Daily  . valACYclovir  500 mg Oral Daily  . DISCONTD: tacrolimus  2 mg Oral To ER  . DISCONTD: tacrolimus  2 mg Oral QPC supper  . DISCONTD: tacrolimus  2 mg Oral QHS  . DISCONTD: tacrolimus  2-3 mg Oral BID  . DISCONTD: tacrolimus  3 mg Oral QAC breakfast   Continuous:   . sodium chloride 75 mL/hr at 06/13/11 0341   [id number], calcium carbonate, HYDROmorphone (DILAUDID) injection, promethazine, zolpidem, DISCONTD: calcium carbonate  Assessment/Plan: 46 yo AAF with ESRD s/p kidney transplant (most recently 2005), chronic hypotension, gastroparesis p/w nausea, headache, dehydration  1. UTI with renal allograft site pain: -CT abdomen negative for abscess, obstruction, calculi  -s/p 1 dose of CTX on 06/12/11; will continue  -f/u blood and urine cultures -greatly appreciate Renal's input and recommendations; will continue immunosuppressive regimen  -Cr slightly trending up, continue to monitor   Lab 06/13/11 0520 06/12/11 2128 06/12/11 1248  CREATININE 1.91* 1.80* 1.68*    2. Hypotension: chronic issue; on midodrine 10mg  BID as home med -no acute drop but will monitor closely as pt with infective source and at risk for sepsis, HR currently stable -will check orthostatic vital signs -continue IV rehydration -patient remains asymptomatic   3. Nausea: patient with known h/o gastroparesis, which may be complicating the picture; appears to be improved this AM with just decreased appetite  -continue phenergen, TUMS PRN -continue reglan, protonix    4. Anemia: chronic kidney disease -monitor closely for possible need to start ESA -slight drop  today, but may also be due to dilutional effect of IVF and rehydration  -Pt due for Aranesp, will defer to Renal  Lab 06/13/11 0520 06/12/11 2128 06/12/11 1248  HGB 7.4* 7.9* 8.1*   5. Facial Edema: unclear etiology at this time, pt likely NOT volume overloaded in general -per renal, may have central venous stenosis with prior HD cath and h/o possible blockage in the past requiring some form of a stent -monitor, no need for immediate intervention since airway is patent and patient is stable from a neuro and respiratory standpoint  6. FEN/GI: NS @ 75cc/hr (pt orders 500cc bolus but would only take 100cc because of facial swelling); renal diet  7. Ppx: heparin, protonix   8. Dispo: pending improvement     LOS: 1 day   Tina Watkins 06/13/2011, 8:29 AM

## 2011-06-13 NOTE — Progress Notes (Signed)
Ortho BP  Lying ---95/47 pulse 66  Sitting--104/59 pulse 65  Standing--97/51 pulse 65

## 2011-06-13 NOTE — Progress Notes (Signed)
I interviewed and examined this patient and discussed the care plan with Dr. Loraine Maple and the [facility] team and agree with assessment and plan as documented in the progress note for today.     [place] A. Walker Kehr, MD Family Medicine Teaching Service Attending  06/13/2011 1:38 PM

## 2011-06-13 NOTE — Progress Notes (Signed)
Pt BP manually taken and reading 82/46, rechecked from reading on dinamap.  MD on call for family practice paged and informed of pt's current condition. States she would come up to assess the patient. Will continue to monitor

## 2011-06-13 NOTE — Progress Notes (Signed)
Patient ID: Tina Watkins, female   DOB: 1965/07/26, 46 y.o.   MRN: [id number]   [facility] Progress Note    Subjective:   Reports to be feeling fair- states poor appetite and face feels swollen   Objective:   BP 83/51  Pulse 68  Temp(Src) 98.3 F (36.8 C) (Oral)  Resp 18  Wt 57.2 kg (126 lb 1.7 oz)  SpO2 100%  Intake/Output Summary (Last 24 hours) at 06/13/11 0957 Last data filed at 06/13/11 0900  Gross per 24 hour  Intake   1355 ml  Output      0 ml  Net   1355 ml   Weight change:   Physical Exam: [id number] resting flat in bed. Facial edema noted. [id number] RRR, normal s1 and s2  Resp:CTA bilaterally, no rales/rhonchi [id number], obese, Mildly tender RLQ. Bowel sounds normal Ext:No Le edema  Imaging: Ct Abdomen Pelvis Wo Contrast  06/12/2011  *RADIOLOGY REPORT*  Clinical Data: Right lower quadrant pain.  History of renal transplant.  CT ABDOMEN AND PELVIS WITHOUT CONTRAST  Technique:  Multidetector CT imaging of the abdomen and pelvis was performed following the standard protocol without intravenous contrast.  Comparison: Transplant ultrasound 05/24/2011. CT chest 10/24/2004.  Findings: There is some atelectatic change in the right lung base. Calcified granulomata are seen in the left lung base.  Scar is noted in the right middle lobe.  No pleural or pericardial effusion. Tortuous retrocrural vasculature is noted and unchanged.  The patient has a right lower quadrant renal transplant.  There is some stranding about the transplanted kidney but no fluid collection, stone, mass or hydronephrosis is identified.  There is a small ovoid calcification in the right lower quadrant which could be secondary to postoperative change, calcified lymph node or possibly a markedly atrophied renal transplant.  The native kidneys appear to have been removed.  There is no lymphadenopathy.  The patient has extensive collateral vasculature in the retroperitoneum.  The  gallbladder, liver, biliary tree, pancreas and adrenal glands appear normal.  A few small splenic calcifications are seen. The bones demonstrate diffuse osteosclerosis consistent with renal osteodystrophy.  IMPRESSION:  1.  No acute finding.  Right lower quadrant renal transplant is unremarkable in appearance without hydronephrosis or surrounding fluid collection. 2.  Extensive collateral vasculature in the chest and abdomen. 3.  Old granulomatous disease. 4.  Renal osteodystrophy.  Original Report Authenticated By: Arvid Right. D'ALESSIO, M.D.    Labs: BMET  Lab 06/13/11 0520 06/12/11 2128 06/12/11 1248  NA 139 -- 137  K 4.5 -- 3.8  CL 102 -- 99  CO2 23 -- 27  GLUCOSE 91 -- 81  BUN 28* -- 25*  CREATININE 1.91* 1.80* 1.68*  ALB -- -- --  CALCIUM 8.8 -- 9.7  PHOS 3.9 -- --   CBC  Lab 06/13/11 0520 06/12/11 2128 06/12/11 1248  WBC 5.7 4.9 6.2  NEUTROABS 5.0 -- 5.5  HGB 7.4* 7.9* 8.1*  HCT 22.3* 23.7* 24.2*  MCV 93.3 94.8 93.4  PLT 149* 138* 171   Results for orders placed during the hospital encounter of 05/22/11  CULTURE, BLOOD (ROUTINE X 2)     Status: Normal   Collection Time   05/22/11 12:00 PM      Component Value Range Status Comment   Specimen Description BLOOD LEFT ARM   Final    Special Requests BOTTLES DRAWN AEROBIC ONLY 1.5CC   Final    Culture  Setup Time [id number]   Final  Culture     Final    Value: ESCHERICHIA COLI     Note: Gram Stain Report Called to,Read Back By and Verified With: THERESA CRITE @0735  ON 05/23/11 BY MCLET   Report Status 05/25/2011 FINAL   Final    Organism ID, Bacteria ESCHERICHIA COLI   Final   CULTURE, BLOOD (ROUTINE X 2)     Status: Normal   Collection Time   05/22/11 12:18 PM      Component Value Range Status Comment   Specimen Description BLOOD LEFT HAND   Final    Special Requests BOTTLES DRAWN AEROBIC ONLY [facility]   Final    Culture  Setup Time [id number]   Final    Culture     Final    Value: ESCHERICHIA COLI     Note:  SUSCEPTIBILITIES PERFORMED ON PREVIOUS CULTURE WITHIN THE LAST 5 DAYS.     Note: Gram Stain Report Called to,Read Back By and Verified With: RN SABO ON 05/23/11 AT 1142 BY TEDAR   Report Status 05/25/2011 FINAL   Final   URINE CULTURE     Status: Normal   Collection Time   05/22/11  1:23 PM      Component Value Range Status Comment   Specimen Description URINE, RANDOM   Final    Special Requests NONE   Final    Culture  Setup Time [id number]   Final    Colony Count 80,000 COLONIES/ML   Final    Culture ESCHERICHIA COLI   Final    Report Status 05/24/2011 FINAL   Final    Organism ID, Bacteria ESCHERICHIA COLI   Final   MRSA PCR SCREENING     Status: Normal   Collection Time   05/22/11  7:21 PM      Component Value Range Status Comment   MRSA by PCR NEGATIVE  NEGATIVE  Final   URINE CULTURE     Status: Normal   Collection Time   05/23/11  1:16 PM      Component Value Range Status Comment   Specimen Description URINE, CLEAN CATCH   Final    Special Requests Immunocompromised   Final    Culture  Setup Time [id number]   Final    Colony Count 5,000 COLONIES/ML   Final    Culture INSIGNIFICANT GROWTH   Final    Report Status 05/24/2011 FINAL   Final      Medications:      . sodium chloride   Intravenous Once  . aspirin EC  81 mg Oral Daily  . atorvastatin  10 mg Oral q1800  . azaTHIOprine  100 mg Oral Daily  . azaTHIOprine  100 mg Oral To ER  . calcitRIOL  0.25 mcg Oral Daily  . cefTRIAXone (ROCEPHIN)  IV  1 g Intravenous Once  . clotrimazole  10 mg Oral 5 X Daily  . clotrimazole  10 mg Oral To ER  . colchicine  0.6 mg Oral Daily  . furosemide  80 mg Oral BID  . heparin  5,000 Units Subcutaneous Q8H  .  HYDROmorphone (DILAUDID) injection  0.5 mg Intravenous Once  . metoCLOPramide  5 mg Oral QHS  . midodrine  10 mg Oral BID  .  morphine injection  4 mg Intravenous Once  . mulitivitamin with minerals  1 tablet Oral Daily  . ondansetron (ZOFRAN) IV  4 mg Intravenous Once    . pantoprazole  40 mg Oral Q1200  . predniSONE  5 mg Oral  Daily  . predniSONE  5 mg Oral To ER  . sodium chloride  1,000 mL Intravenous Once  . sodium chloride  500 mL Intravenous Once  . sodium chloride  3 mL Intravenous Q12H  . tacrolimus  2 mg Oral QHS  . tacrolimus  3 mg Oral Daily  . valACYclovir  500 mg Oral Daily  . DISCONTD: tacrolimus  2 mg Oral To ER  . DISCONTD: tacrolimus  2 mg Oral QPC supper  . DISCONTD: tacrolimus  2 mg Oral QHS  . DISCONTD: tacrolimus  2-3 mg Oral BID  . DISCONTD: tacrolimus  3 mg Oral QAC breakfast     Assessment/ Plan:   1. UTI with renal allograft site pain: Negative CT for abscess/obstruction/calculi. Renal function stable and I agree with liberal intravenous fluids as urinary tract infection is confirmed by the repeat urine cultures and treated with empiric antibiotics.  2. Hypotension: Monitor with intravenous fluid resuscitation- concern for sepsis as she recently had.  3. Anemia: Likely anemia of chronic kidney disease compounded by recent infection/hospitalizations and inflammatory resistance to erythropoietin. Restart ESA.  4. Gastroparesis: Continue metoclopramide therapy. 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

## 2011-06-13 NOTE — Progress Notes (Signed)
Pt admitted to [id number] placed on telemetry running NSR. Pt is alert oriented x3 comes from home. Pt is ambulatory with standby assistance. She is blind in right eye. Pt educated on safety needs and yellow arm band and red socks placed on patient. Pt told to use call bell light when needing assistance. Pt has bruising to left leg. No other skin issues. Will continue to monitor patient.

## 2011-06-13 NOTE — Progress Notes (Signed)
Pt's BP rechecked 80/59 P 62 pt asymptomatic. Dr. Otis Dials paged and notified of pt's status. No new orders received at this time. Will continue to monitor patient and notify MD when needed.

## 2011-06-13 NOTE — Progress Notes (Signed)
Pt tld of new order of 500cc bolus to help increase BP. Pt did not want full bolus and only received 100cc. Pt asymptomatic and pt stated that her Bp can drop that low at times. Will continue to monitor pt.

## 2011-06-13 NOTE — H&P (Signed)
I interviewed and examined this patient and discussed the care plan with Dr. Adrian Blackwater and the [facility] team and agree with assessment and plan as documented in the admission note for today. She says that midodrine was started in April, but she doesn't think that it is working. The dose could be increased to 10 mg tid if OH persists.    Gillett A. Walker Kehr, MD Family Medicine Teaching Service Attending  06/13/2011 1:42 PM

## 2011-06-14 ENCOUNTER — Encounter (HOSPITAL_COMMUNITY): Payer: Self-pay

## 2011-06-14 LAB — URINE CULTURE
Colony Count: 30000
Culture  Setup Time: 201305101422

## 2011-06-14 LAB — RENAL FUNCTION PANEL
Albumin: 3 g/dL — ABNORMAL LOW (ref 3.5–5.2)
BUN: 27 mg/dL — ABNORMAL HIGH (ref 6–23)
CO2: 23 mEq/L (ref 19–32)
Calcium: 9.2 mg/dL (ref 8.4–10.5)
Chloride: 106 mEq/L (ref 96–112)
Creatinine, Ser: 1.85 mg/dL — ABNORMAL HIGH (ref 0.50–1.10)
GFR calc Af Amer: 37 mL/min — ABNORMAL LOW (ref >90)
GFR calc non Af Amer: 32 mL/min — ABNORMAL LOW (ref >90)
Glucose, Bld: 107 mg/dL — ABNORMAL HIGH (ref 70–99)
Phosphorus: 2.7 mg/dL (ref 2.3–4.6)
Potassium: 3.5 mEq/L (ref 3.5–5.1)
Sodium: 141 mEq/L (ref 135–145)

## 2011-06-14 LAB — CBC
HCT: 23 % — ABNORMAL LOW (ref 36.0–46.0)
Hemoglobin: 7.7 g/dL — ABNORMAL LOW (ref 12.0–15.0)
MCH: 31.2 pg (ref 26.0–34.0)
MCHC: 33.5 g/dL (ref 30.0–36.0)
MCV: 93.1 fL (ref 78.0–100.0)
Platelets: 151 10*3/uL (ref 150–400)
RBC: 2.47 MIL/uL — ABNORMAL LOW (ref 3.87–5.11)
RDW: 20.4 % — ABNORMAL HIGH (ref 11.5–15.5)
WBC: 3.8 10*3/uL — ABNORMAL LOW (ref 4.0–10.5)

## 2011-06-14 LAB — MAGNESIUM: Magnesium: 1.7 mg/dL (ref 1.5–2.5)

## 2011-06-14 MED ORDER — PIPERACILLIN-TAZOBACTAM 3.375 G IVPB
3.3750 g | Freq: Three times a day (TID) | INTRAVENOUS | Status: DC
  Administered 2011-06-14 – 2011-06-15 (×3): 3.375 g via INTRAVENOUS
  Filled 2011-06-14 (×5): qty 50

## 2011-06-14 MED ORDER — ROSUVASTATIN CALCIUM 5 MG PO TABS
5.0000 mg | ORAL_TABLET | Freq: Every day | ORAL | Status: DC
  Administered 2011-06-14 – 2011-06-16 (×3): 5 mg via ORAL
  Filled 2011-06-14 (×4): qty 1

## 2011-06-14 MED ORDER — DIPHENHYDRAMINE HCL 50 MG/ML IJ SOLN
25.0000 mg | Freq: Four times a day (QID) | INTRAMUSCULAR | Status: DC | PRN
  Administered 2011-06-14 – 2011-06-15 (×2): 25 mg via INTRAVENOUS
  Filled 2011-06-14 (×2): qty 1

## 2011-06-14 NOTE — Progress Notes (Signed)
I interviewed and examined this patient and discussed the care plan with Dr. Charlett Blake and the [facility] team and agree with assessment and plan as documented in the progress note for today.    Tina Watkins, [place] [facility] Attending  06/14/2011 3:29 PM

## 2011-06-14 NOTE — Progress Notes (Signed)
FPTS PROGRESS NOTE  Subjective: Feels much improved. Pain is better, no nausea, some improvement in appetite and generalized weakness.   Objective: Vital signs in last 24 hours: Temp:  [97.9 F (36.6 C)-98.4 F (36.9 C)] 98.2 F (36.8 C) (05/12 0507) Pulse Rate:  [59-69] 59  (05/12 0507) Resp:  [17-18] 18  (05/12 0507) BP: (80-104)/(39-59) 104/53 mmHg (05/12 0507) SpO2:  [98 %-100 %] 100 % (05/12 0507) Weight:  [128 lb 8.5 oz (58.3 kg)] 128 lb 8.5 oz (58.3 kg) (05/11 2214) Weight change: 2 lb 6.8 oz (1.1 kg) Last BM Date: 06/12/11  Intake/Output from previous day: 05/11 0701 - 05/12 0700 In: 1988 [P.O.:240; I.V.:1748] Out: -  Intake/Output this shift:    Physical Exam: Gen: NAD, laying in bed HEENT: MMM, obvious facial edema CV: RRR Pulm: CTA bilaterally, no wheezes or crackles Abd: soft, normal BS, mild TTP RLQ Ext: warm, no LE edema  Lab Results:  [facility] 06/14/11 0914 06/13/11 0520  WBC 3.8* 5.7  HGB 7.7* 7.4*  HCT 23.0* 22.3*  PLT 151 149*   BMET  Basename 06/13/11 0520 06/12/11 2128 06/12/11 1248  NA 139 -- 137  K 4.5 -- 3.8  CL 102 -- 99  CO2 23 -- 27  GLUCOSE 91 -- 81  BUN 28* -- 25*  CREATININE 1.91* 1.80* --  CALCIUM 8.8 -- 9.7    Studies/Results: Ct Abdomen Pelvis Wo Contrast  06/12/2011  *RADIOLOGY REPORT*  Clinical Data: Right lower quadrant pain.  History of renal transplant.  CT ABDOMEN AND PELVIS WITHOUT CONTRAST  Technique:  Multidetector CT imaging of the abdomen and pelvis was performed following the standard protocol without intravenous contrast.  Comparison: Transplant ultrasound 05/24/2011. CT chest 10/24/2004.  Findings: There is some atelectatic change in the right lung base. Calcified granulomata are seen in the left lung base.  Scar is noted in the right middle lobe.  No pleural or pericardial effusion. Tortuous retrocrural vasculature is noted and unchanged.  The patient has a right lower quadrant renal transplant.  There is some  stranding about the transplanted kidney but no fluid collection, stone, mass or hydronephrosis is identified.  There is a small ovoid calcification in the right lower quadrant which could be secondary to postoperative change, calcified lymph node or possibly a markedly atrophied renal transplant.  The native kidneys appear to have been removed.  There is no lymphadenopathy.  The patient has extensive collateral vasculature in the retroperitoneum.  The gallbladder, liver, biliary tree, pancreas and adrenal glands appear normal.  A few small splenic calcifications are seen. The bones demonstrate diffuse osteosclerosis consistent with renal osteodystrophy.  IMPRESSION:  1.  No acute finding.  Right lower quadrant renal transplant is unremarkable in appearance without hydronephrosis or surrounding fluid collection. 2.  Extensive collateral vasculature in the chest and abdomen. 3.  Old granulomatous disease. 4.  Renal osteodystrophy.  Original Report Authenticated By: Arvid Right. Luther Parody, M.D.    Medications:  I have reviewed the patient's current medications. Scheduled:    . aspirin EC  81 mg Oral Daily  . atorvastatin  10 mg Oral q1800  . azaTHIOprine  100 mg Oral Daily  . calcitRIOL  0.25 mcg Oral Daily  . cefTRIAXone (ROCEPHIN)  IV  1 g Intravenous Q24H  . clotrimazole  10 mg Oral 5 X Daily  . clotrimazole  10 mg Oral To ER  . colchicine  0.6 mg Oral Daily  . darbepoetin (ARANESP) injection - NON-DIALYSIS  100 mcg Subcutaneous Q  Sat-1800  . furosemide  80 mg Oral BID  . heparin  5,000 Units Subcutaneous Q8H  . metoCLOPramide  5 mg Oral QHS  . midodrine  10 mg Oral BID  . mulitivitamin with minerals  1 tablet Oral Daily  . pantoprazole  40 mg Oral Q1200  . predniSONE  5 mg Oral Daily  . sodium chloride  500 mL Intravenous Once  . sodium chloride  3 mL Intravenous Q12H  . tacrolimus  2 mg Oral QHS  . tacrolimus  3 mg Oral Daily  . valACYclovir  500 mg Oral Daily   Continuous:    .  sodium chloride 75 mL/hr at 06/14/11 [id number]   [id number], calcium carbonate, HYDROmorphone (DILAUDID) injection, promethazine, zolpidem  Assessment/Plan: 46 yo AAF with ESRD s/p kidney transplant (most recently 2005), chronic hypotension, gastroparesis p/w nausea, headache, dehydration  1. UTI with renal allograft site pain: -CT abdomen negative for abscess, obstruction, calculi  - CTX started on 06/12/11; will continue  - f/u blood and urine cultures (e coli but no sensitivities yet)  -greatly appreciate Renal's input and recommendations; will continue immunosuppressive regimen  -Cr slightly trending up, continue to monitor, still pending today   Lab 06/13/11 0520 06/12/11 2128 06/12/11 1248  CREATININE 1.91* 1.80* 1.68*    2. Hypotension: chronic issue; on midodrine 10mg  BID as home med -no acute drop but will monitor closely as pt with infective source and at risk for sepsis, HR currently stable -orthostatics negative -continue IV rehydration -patient remains asymptomatic   3. Nausea: patient with known h/o gastroparesis, which may be complicating the picture; appears to be improved this AM with just decreased appetite  -continue phenergen, TUMS PRN -continue reglan, protonix    4. Anemia: chronic kidney disease -stable -recieved Aranesp, will defer to Renal  Lab 06/14/11 0914 06/13/11 0520 06/12/11 2128 06/12/11 1248  HGB 7.7* 7.4* 7.9* 8.1*   5. Facial Edema: unclear etiology at this time, pt likely NOT volume overloaded in general -per renal, may have central venous stenosis with prior HD cath and h/o possible blockage in the past requiring some form of a stent -monitor, no need for immediate intervention since airway is patent and patient is stable from a neuro and respiratory standpoint  6. FEN/GI: NS @ 75cc/hr, renal diet  7. Ppx: heparin, protonix   8. Dispo: pending improvement     LOS: 2 days   Davida Falconi 06/14/2011, 10:22 AM

## 2011-06-14 NOTE — Progress Notes (Signed)
Patient ID: Tina Watkins, female   DOB: December 05, 1965, 46 y.o.   MRN: [id number]   [facility] Progress Note    Subjective:   Reports to be feeling better and denies any allograft site pain/chills   Objective:   BP 104/53  Pulse 59  Temp(Src) 98.2 F (36.8 C) (Oral)  Resp 18  Ht 4\' 11"  (1.499 m)  Wt 128 lb 8.5 oz (58.3 kg)  BMI 25.96 kg/m2  SpO2 100%  Intake/Output Summary (Last 24 hours) at 06/14/11 1120 Last data filed at 06/14/11 0700  Gross per 24 hour  Intake   1748 ml  Output      0 ml  Net   1748 ml   Weight change: 2 lb 6.8 oz (1.1 kg)  Physical Exam: [id number] ambulating in room [id number] RRR, normal S1 and S2  Resp:CTA bilaterally, no rales/rhonchi [id number], Obese, mildly tender over lower quadrants Ext:No LE edema  Imaging: Ct Abdomen Pelvis Wo Contrast  06/12/2011  *RADIOLOGY REPORT*  Clinical Data: Right lower quadrant pain.  History of renal transplant.  CT ABDOMEN AND PELVIS WITHOUT CONTRAST  Technique:  Multidetector CT imaging of the abdomen and pelvis was performed following the standard protocol without intravenous contrast.  Comparison: Transplant ultrasound 05/24/2011. CT chest 10/24/2004.  Findings: There is some atelectatic change in the right lung base. Calcified granulomata are seen in the left lung base.  Scar is noted in the right middle lobe.  No pleural or pericardial effusion. Tortuous retrocrural vasculature is noted and unchanged.  The patient has a right lower quadrant renal transplant.  There is some stranding about the transplanted kidney but no fluid collection, stone, mass or hydronephrosis is identified.  There is a small ovoid calcification in the right lower quadrant which could be secondary to postoperative change, calcified lymph node or possibly a markedly atrophied renal transplant.  The native kidneys appear to have been removed.  There is no lymphadenopathy.  The patient has extensive collateral vasculature in  the retroperitoneum.  The gallbladder, liver, biliary tree, pancreas and adrenal glands appear normal.  A few small splenic calcifications are seen. The bones demonstrate diffuse osteosclerosis consistent with renal osteodystrophy.  IMPRESSION:  1.  No acute finding.  Right lower quadrant renal transplant is unremarkable in appearance without hydronephrosis or surrounding fluid collection. 2.  Extensive collateral vasculature in the chest and abdomen. 3.  Old granulomatous disease. 4.  Renal osteodystrophy.  Original Report Authenticated By: Arvid Right. D'ALESSIO, M.D.    Labs: BMET  Lab 06/14/11 0914 06/13/11 0520 06/12/11 2128 06/12/11 1248  NA 141 139 -- 137  K 3.5 4.5 -- 3.8  CL 106 102 -- 99  CO2 23 23 -- 27  GLUCOSE 107* 91 -- 81  BUN 27* 28* -- 25*  CREATININE 1.85* 1.91* 1.80* 1.68*  ALB -- -- -- --  CALCIUM 9.2 8.8 -- 9.7  PHOS 2.7 3.9 -- --   CBC  Lab 06/14/11 0914 06/13/11 0520 06/12/11 2128 06/12/11 1248  WBC 3.8* 5.7 4.9 6.2  NEUTROABS -- 5.0 -- 5.5  HGB 7.7* 7.4* 7.9* 8.1*  HCT 23.0* 22.3* 23.7* 24.2*  MCV 93.1 93.3 94.8 93.4  PLT 151 149* 138* 171    Medications:      . aspirin EC  81 mg Oral Daily  . atorvastatin  10 mg Oral q1800  . azaTHIOprine  100 mg Oral Daily  . calcitRIOL  0.25 mcg Oral Daily  . cefTRIAXone (ROCEPHIN)  IV  1 g  Intravenous Q24H  . clotrimazole  10 mg Oral 5 X Daily  . clotrimazole  10 mg Oral To ER  . colchicine  0.6 mg Oral Daily  . darbepoetin (ARANESP) injection - NON-DIALYSIS  100 mcg Subcutaneous Q Sat-1800  . furosemide  80 mg Oral BID  . heparin  5,000 Units Subcutaneous Q8H  . metoCLOPramide  5 mg Oral QHS  . midodrine  10 mg Oral BID  . mulitivitamin with minerals  1 tablet Oral Daily  . pantoprazole  40 mg Oral Q1200  . predniSONE  5 mg Oral Daily  . sodium chloride  500 mL Intravenous Once  . sodium chloride  3 mL Intravenous Q12H  . tacrolimus  2 mg Oral QHS  . tacrolimus  3 mg Oral Daily  . valACYclovir  500 mg Oral  Daily     Assessment/ Plan:   1. UTI with renal allograft site pain: Negative CT for abscess/obstruction/calculi. Renal function stable and I will discontinue intravenous fluids and allow PO intake. May possibly be able to DC home today on PO antibiotics.  2. Hypotension: BP improved/stable s/p IVFs  3. Anemia: Likely anemia of chronic kidney disease compounded by recent infection/hospitalizations and inflammatory resistance to erythropoietin. Restart ESA.  4. Gastroparesis: Continue metoclopramide therapy.  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

## 2011-06-14 NOTE — Progress Notes (Signed)
ANTIBIOTIC CONSULT NOTE - INITIAL  Pharmacy Consult for zosyn Indication: GNR bacteremia and E-coli UTI  Allergies  Allergen Reactions  . Levofloxacin Rash    Patient Measurements: Height: 4\' 11"  (149.9 cm) Weight: 128 lb 8.5 oz (58.3 kg) IBW/kg (Calculated) : 43.2  Adjusted Body Weight:   Vital Signs: Temp: 97.3 F (36.3 C) (05/12 1324) Temp src: Oral (05/12 1324) BP: 92/59 mmHg (05/12 1324) Pulse Rate: 61  (05/12 1324) Intake/Output from previous day: 05/11 0701 - 05/12 0700 In: 1988 [P.O.:240; I.V.:1748] Out: -  Intake/Output from this shift: Total I/O In: 120 [P.O.:120] Out: -   Labs:  Basename 06/14/11 0914 06/13/11 0520 06/12/11 2128  WBC 3.8* 5.7 4.9  HGB 7.7* 7.4* 7.9*  PLT 151 149* 138*  LABCREA -- -- --  CREATININE 1.85* 1.91* 1.80*   Estimated Creatinine Clearance: 30.5 ml/min (by C-G formula based on Cr of 1.85). No results found for this basename: VANCOTROUGH:2,VANCOPEAK:2,VANCORANDOM:2,GENTTROUGH:2,GENTPEAK:2,GENTRANDOM:2,TOBRATROUGH:2,TOBRAPEAK:2,TOBRARND:2,AMIKACINPEAK:2,AMIKACINTROU:2,AMIKACIN:2, in the last 72 hours   Microbiology: Recent Results (from the past 720 hour(s))  CULTURE, BLOOD (ROUTINE X 2)     Status: Normal   Collection Time   05/22/11 12:00 PM      Component Value Range Status Comment   Specimen Description BLOOD LEFT ARM   Final    Special Requests BOTTLES DRAWN AEROBIC ONLY 1.5CC   Final    Culture  Setup Time [id number]   Final    Culture     Final    Value: ESCHERICHIA COLI     Note: Gram Stain Report Called to,Read Back By and Verified With: THERESA CRITE @0735  ON 05/23/11 BY MCLET   Report Status 05/25/2011 FINAL   Final    Organism ID, Bacteria ESCHERICHIA COLI   Final   CULTURE, BLOOD (ROUTINE X 2)     Status: Normal   Collection Time   05/22/11 12:18 PM      Component Value Range Status Comment   Specimen Description BLOOD LEFT HAND   Final    Special Requests BOTTLES DRAWN AEROBIC ONLY [facility]   Final    Culture   Setup Time [id number]   Final    Culture     Final    Value: ESCHERICHIA COLI     Note: SUSCEPTIBILITIES PERFORMED ON PREVIOUS CULTURE WITHIN THE LAST 5 DAYS.     Note: Gram Stain Report Called to,Read Back By and Verified With: RN SABO ON 05/23/11 AT 1142 BY TEDAR   Report Status 05/25/2011 FINAL   Final   URINE CULTURE     Status: Normal   Collection Time   05/22/11  1:23 PM      Component Value Range Status Comment   Specimen Description URINE, RANDOM   Final    Special Requests NONE   Final    Culture  Setup Time [id number]   Final    Colony Count 80,000 COLONIES/ML   Final    Culture ESCHERICHIA COLI   Final    Report Status 05/24/2011 FINAL   Final    Organism ID, Bacteria ESCHERICHIA COLI   Final   MRSA PCR SCREENING     Status: Normal   Collection Time   05/22/11  7:21 PM      Component Value Range Status Comment   MRSA by PCR NEGATIVE  NEGATIVE  Final   URINE CULTURE     Status: Normal   Collection Time   05/23/11  1:16 PM      Component Value Range Status Comment  Specimen Description URINE, CLEAN CATCH   Final    Special Requests Immunocompromised   Final    Culture  Setup Time [id number]   Final    Colony Count 5,000 COLONIES/ML   Final    Culture INSIGNIFICANT GROWTH   Final    Report Status 05/24/2011 FINAL   Final   URINE CULTURE     Status: Normal   Collection Time   06/12/11  1:23 PM      Component Value Range Status Comment   Specimen Description URINE, CLEAN CATCH   Final    Special Requests NONE   Final    Culture  Setup Time [id number]   Final    Colony Count 30,000 COLONIES/ML   Final    Culture ESCHERICHIA COLI   Final    Report Status 06/14/2011 FINAL   Final    Organism ID, Bacteria ESCHERICHIA COLI   Final   CULTURE, BLOOD (ROUTINE X 2)     Status: Normal (Preliminary result)   Collection Time   06/12/11  3:50 PM      Component Value Range Status Comment   Specimen Description BLOOD ARM LEFT   Final    Special Requests BOTTLES DRAWN AEROBIC  ONLY 5CC   Final    Culture  Setup Time [id number]   Final    Culture     Final    Value: GRAM NEGATIVE RODS     Note: Gram Stain Report Called to,Read Back By and Verified With: RN D. BEASON ON 06/13/11 AT 1905 BY DTERRY   Report Status PENDING   Incomplete   CULTURE, BLOOD (ROUTINE X 2)     Status: Normal (Preliminary result)   Collection Time   06/12/11  4:10 PM      Component Value Range Status Comment   Specimen Description BLOOD HAND RIGHT   Final    Special Requests BOTTLES DRAWN AEROBIC AND ANAEROBIC 5CC   Final    Culture  Setup Time [id number]   Final    Culture     Final    Value:        BLOOD CULTURE RECEIVED NO GROWTH TO DATE CULTURE WILL BE HELD FOR 5 DAYS BEFORE ISSUING A FINAL NEGATIVE REPORT   Report Status PENDING   Incomplete     Medical History: Past Medical History  Diagnosis Date  . Gastroparesis   . Hx of kidney transplant   . Gout   . Hypotension   . Herpes genitalia   . Stroke      left basal ganglia lacunar infarct; Right frontal lobe lacunar infarct.    Medications:  Scheduled:    . aspirin EC  81 mg Oral Daily  . atorvastatin  10 mg Oral q1800  . azaTHIOprine  100 mg Oral Daily  . calcitRIOL  0.25 mcg Oral Daily  . clotrimazole  10 mg Oral 5 X Daily  . clotrimazole  10 mg Oral To ER  . colchicine  0.6 mg Oral Daily  . darbepoetin (ARANESP) injection - NON-DIALYSIS  100 mcg Subcutaneous Q Sat-1800  . furosemide  80 mg Oral BID  . heparin  5,000 Units Subcutaneous Q8H  . metoCLOPramide  5 mg Oral QHS  . midodrine  10 mg Oral BID  . mulitivitamin with minerals  1 tablet Oral Daily  . pantoprazole  40 mg Oral Q1200  . predniSONE  5 mg Oral Daily  . sodium chloride  500 mL Intravenous Once  . sodium chloride  3 mL Intravenous Q12H  . tacrolimus  2 mg Oral QHS  . tacrolimus  3 mg Oral Daily  . valACYclovir  500 mg Oral Daily  . DISCONTD: cefTRIAXone (ROCEPHIN)  IV  1 g Intravenous Q24H   Infusions:    . DISCONTD: sodium chloride 75  mL/hr at 06/14/11 [id number]   Assessment: 46 yo female with GNR bacteremia and E-coli UTI will be switched from ceftriaxone to zosyn therapy.  Patient's s/p kidney transplant. SCr 1.85 (CrCl ~30.5)  Goal of Therapy:    Plan:  1) MD to d/c ceftriaxone 2) Zosyn 3.375g iv q8h (4 hour infusions) 3) Follow on cx and sensitivity.  Dejon Jungman, Tsz-Yin 06/14/2011,3:29 PM

## 2011-06-14 NOTE — Progress Notes (Signed)
Patient ID: Tina Watkins, female   DOB: 1965/08/25, 46 y.o.   MRN: [id number]  FPTS Interim Note:  Visited patient to inform her that blood culture is growing Gram Negative Rods in addition to E.coli found in urine.  Patient wanted to go home today, but I advised her to stay in hospital for IV Antibiotics.  Discussed with Pharmacy resident.  Plan to start IV Zosyn and follow up sensitivities.  Patient agrees to stay in hospital today further work up and close monitoring.    DE LA CRUZ,Kaylyn Garrow

## 2011-06-15 LAB — RENAL FUNCTION PANEL
Albumin: 3.2 g/dL — ABNORMAL LOW (ref 3.5–5.2)
BUN: 23 mg/dL (ref 6–23)
CO2: 24 mEq/L (ref 19–32)
Calcium: 9.5 mg/dL (ref 8.4–10.5)
Chloride: 105 mEq/L (ref 96–112)
Creatinine, Ser: 2.19 mg/dL — ABNORMAL HIGH (ref 0.50–1.10)
GFR calc Af Amer: 31 mL/min — ABNORMAL LOW (ref >90)
GFR calc non Af Amer: 26 mL/min — ABNORMAL LOW (ref >90)
Glucose, Bld: 76 mg/dL (ref 70–99)
Phosphorus: 3.4 mg/dL (ref 2.3–4.6)
Potassium: 3.9 mEq/L (ref 3.5–5.1)
Sodium: 140 mEq/L (ref 135–145)

## 2011-06-15 LAB — CBC
HCT: 23.1 % — ABNORMAL LOW (ref 36.0–46.0)
Hemoglobin: 7.6 g/dL — ABNORMAL LOW (ref 12.0–15.0)
MCH: 30.9 pg (ref 26.0–34.0)
MCHC: 32.9 g/dL (ref 30.0–36.0)
MCV: 93.9 fL (ref 78.0–100.0)
Platelets: 166 10*3/uL (ref 150–400)
RBC: 2.46 MIL/uL — ABNORMAL LOW (ref 3.87–5.11)
RDW: 20.7 % — ABNORMAL HIGH (ref 11.5–15.5)
WBC: 3.5 10*3/uL — ABNORMAL LOW (ref 4.0–10.5)

## 2011-06-15 LAB — CULTURE, BLOOD (ROUTINE X 2): Culture  Setup Time: 201305110301

## 2011-06-15 MED ORDER — CEPHALEXIN 500 MG PO CAPS
500.0000 mg | ORAL_CAPSULE | Freq: Three times a day (TID) | ORAL | Status: DC
  Administered 2011-06-15 – 2011-06-16 (×4): 500 mg via ORAL
  Filled 2011-06-15 (×6): qty 1

## 2011-06-15 MED ORDER — DEXTROSE 5 % IV SOLN
1.0000 g | INTRAVENOUS | Status: DC
  Filled 2011-06-15: qty 10

## 2011-06-15 MED ORDER — CAMPHOR-MENTHOL 0.5-0.5 % EX LOTN
TOPICAL_LOTION | CUTANEOUS | Status: DC | PRN
  Filled 2011-06-15: qty 222

## 2011-06-15 MED ORDER — COLCHICINE 0.6 MG PO TABS
0.3000 mg | ORAL_TABLET | Freq: Every day | ORAL | Status: DC
  Administered 2011-06-15 – 2011-06-16 (×2): 0.3 mg via ORAL
  Filled 2011-06-15 (×3): qty 0.5

## 2011-06-15 MED ORDER — CEPHALEXIN 500 MG PO CAPS: 500.0000 mg | ORAL_CAPSULE | Freq: Four times a day (QID) | ORAL | Status: DC

## 2011-06-15 NOTE — Progress Notes (Signed)
Family Medicine Teaching Service Daily Resident Note   Patient name: Tina Watkins Medical record number: [id number] Date of birth: [birth year] Age: 46 y.o. Gender: female Length of Stay:  LOS: 3 days             SUBJECTIVE & OVERNIGHT EVENTS  Pt reporting feeling much better; wanting to go home; understanding of continued observation given + blood cultures            OBJECTIVE  Vitals: Patient Vitals for the past 24 hrs:  BP Temp Temp src Pulse Resp SpO2 Weight  06/15/11 0543 99/60 mmHg 98 F (36.7 C) Oral 66  18  97 % -   Wt Readings from Last 3 Encounters:  06/15/11 128 lb 10.9 oz (58.37 kg)  05/29/11 135 lb 5.8 oz (61.4 kg)  02/23/11 131 lb (59.421 kg)    Intake/Output Summary (Last 24 hours) at 06/15/11 2134 Last data filed at 06/15/11 1700  Gross per 24 hour  Intake   1250 ml  Output    700 ml  Net    550 ml    PE: GENERAL:  Adult AA female.  Examined in [facility] ED.  Laying in bed  In mild discomfort; norespiratory distress.   PSYCH: Alert and appropriately interactive; Insight:Good  Alert, oriented, thought content appropriate, alertness: alert, thought content exhibits logical connections although some what bizarre affect H&N: AT/Charlo, MMM, no scleral icterus, EOMi, mild swelling of face bilaterally THORAX: HEART: RRR, S1/S2 heard, 2+/6 systolic murmur over R sternal boarder LUNGS: CTA B, no wheezes, no crackles ABDOMEN:  +BS, soft, non-tender, no rigidity, no guarding, no masses/organomegaly EXTREMITIES: Moves all 4 extremities spontaneously, warm well perfused, no edema, bilateral DP and PT pulses 2+/4.   LABS:  Lab 06/15/11 0527 06/14/11 0914 06/13/11 0520 06/12/11 2128 06/12/11 1248  WBC 3.5* 3.8* 5.7 4.9 6.2  HGB 7.6* 7.7* 7.4* 7.9* 8.1*  HCT 23.1* 23.0* 22.3* 23.7* 24.2*  PLT 166 151 149* 138* 171    Lab 06/15/11 0527 06/14/11 0914 06/13/11 0520 06/12/11 2128 06/12/11 1248  NA 140 141 139 -- 137  K 3.9 3.5 4.5 -- 3.8  CL 105 106 102 -- 99  CO2 24 23 23   -- 27  BUN 23 27* 28* -- 25*  CREATININE 2.19* 1.85* 1.91* 1.80* 1.68*  CALCIUM 9.5 9.2 8.8 -- 9.7  GLUCOSE 76 107* 91 -- 81    Metabolic:  Lab [id number] 06/14/11 0914 06/13/11 0520  MG -- 1.7 1.6  PHOS 3.4 2.7 3.9    Lab 06/13/11 0520  ALT 19  AST 23  ALKPHOS 77  BILITOT 0.7  BILIDIR --    Lab 06/15/11 0527 06/14/11 0914 06/13/11 0520  PROT -- -- 5.7*  ALBUMIN 3.2* 3.0* 3.0*  PREALBUMIN -- -- --    Lab 06/13/11 1031  IRON <10*  TIBC Not calculated due to Iron <10.  FERRITIN 1533*     Hematology:  Lab 06/13/11 0520 06/12/11 1248  LYMPHSABS 0.3* 0.4*  MONOABS 0.4 0.3  EOSABS 0.0 0.0  BASOSABS 0.0 0.0    Lab 06/15/11 0527 06/14/11 0914 06/13/11 0520  RDW 20.7* 20.4* 20.9*  MCV 93.9 93.1 93.3  MCHC 32.9 33.5 33.2  MRET -- -- --    Urinalysis  Lab 06/12/11 1323  COLORURINE YELLOW  APPEARANCEUR CLEAR  LABSPEC 1.007  PHURINE 6.0  GLUCOSEU NEGATIVE  HGBUR SMALL*  BILIRUBINUR NEGATIVE  KETONESUR NEGATIVE  PROTEINUR NEGATIVE  UROBILINOGEN 0.2  NITRITE NEGATIVE  LEUKOCYTESUR SMALL*  IMAGING: (in last 48 Hours) No results found.  MICRO: 5/10 < > Urine Culture - E. Coli - sensitive to cephalasporins 5/10 < > Blood Culture X 2 - E. Coli - sensitive to cephalasporins  MEDS: I have reviewed all medications and made changes as below.            [facility] Day #10 46 y.o. year old female with ESRD s/p 3 Renal Transplants presenting with RLQ pain over transplant site and reported fever at home.  Associated nausea/vomiting.  #  ID & IMMUNOLOGY: (Immunosuppression in Renal Transplant Pt, Herpes Simplex, Urinary Tract Infection, Bacteremia, Oral thrush) - Pt on chronic immunosuppressive medications for renal transplant.  Concern is raised due to diminished immune response and may be late to mount an appropriate leukocytosis.  Given recurrent UTI will obtain CT as above to assess for abscess  [facility] Course    *Keflex *Valtrex *Azathioprine *Tacrolimus *Prednisone *Clotrimazole   Pt continued on home immunosuppression regimen at time of admission  + E Coli UTI with Bacteremia > on Rocephin > Zosyn > keflex                                      Plan / Followup  - Transitioned to keflex after 48o of IV ABX - currently afebrile >24o - Continue to monitor for fever    #  ABDOMINAL PAIN - improved - renal allograft site pain - Acute onset as of day of admission.  Located over RLQ at transplant site.  Pt reports this pain is not like her pain with prior transplants or with her last serious UTI.    [place] Course   *Zofran *Phergan *Reglan *Dilaudid  No imaging indicated at time of admission as found to have UTI  After discussing case with Dr. Posey Pronto decision to obtain CT Abdomen/Pelvis to asses for abscess  CT abdomen pelvis negative for acute process                                      Plan / Followup  Resolved - pt was walking off of floor and reporting feeling at baseline   #  Renal: (ESRD s/p renal allograft) - No evidence of AKI; Cr appears to be improved from prior hospitalization  [facility] Course   *Calcitriol *Lasix 80mg  bid  Cr @ admission 1.68  Specific Gravity 1.006; but appears clinically dry  5/13 - Cr worsening to 2.19                                       Plan / Followup  Continue to monitor; consider ATN if subclinical hypotensive event with bactermia    #  GOUT:  - Pt with long standing history; received injection in her L 1st MTP earlier in week of admission  [facility] Course   *Colchicine  Home regimen continued                                      Plan / Followup      #  CV: (Hypotension, remote hx of CVA) -  Pt BP stable at time of admission; provided fluid bolus and continued home midrodrine - no evidence of CVA on initial exam and pt doesn't report any worsening in neuro function but does report she is concerned  her problems could be from a stroke  [place]  BP stable throughout not requiring further intervention                                      Plan / Followup  Continue to monitor   # ANEMIA - Likely anemia of chronic disease;  On Aranesp; Defer further plan to renal  # Facial Edema - 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  Will hold at admission, if symptomatic will consider longer acting benzo (i.e. Ativan) - On home Crestor > Lipitor on [facility]   *PPI *Reglan *Ambien *Lipitor                                       Plan / Followup  Consider Ativan for Anxiety   --- FEN  *SL -Renal --- PPx: Heparin & PPI            DISPOSITION  Pt transitioned to po ABX today.  Will continue to monitor for fever and renal function.  If cr continues to worsen will consider restarting IV ABX and continue renal workup however plan for d/c in AM   Gerda Diss, [place] Resident - PGY-1 06/15/2011 9:40 PM

## 2011-06-15 NOTE — Progress Notes (Signed)
FMTS Attending Note  Patient seen and examined by me, discussed with resident team.  Patient's E coli antibiogram shows considerable sensitivity with all cephalosporins.  She is improving clinically.  Changed to oral cephalosporin to complete treatment course.  Continue to monitor renal function in anticipation of discharge. Dalbert Mayotte, MD

## 2011-06-15 NOTE — Progress Notes (Signed)
   CARE MANAGEMENT NOTE 06/15/2011  Patient:  Tina Watkins, Tina Watkins   Account Number:  [id number]  Date Initiated:  06/15/2011  Documentation initiated by:  Lizabeth Leyden  Subjective/Objective Assessment:   Patient admitted with RUQ pain, fever and hx renal transplant 2005     Action/Plan:   Progression of care and discharge planing   Anticipated DC Date:  06/16/2011   Anticipated DC Plan:  [place]  CM consult      Choice offered to / List presented to:             Status of service:  In process, will continue to follow Medicare Important Message given?   (If response is "NO", the following Medicare IM given date fields will be blank) Date Medicare IM given:   Date Additional Medicare IM given:    Discharge Disposition:    Per UR Regulation:    If discussed at Long Length of Stay Meetings, dates discussed:    Comments:  06/15/2011 [place], [place] Met with patient to discuss CM and discharge planning process. She does not anticipate any need for home health at this time. CM will continue to follow for discharge needs.

## 2011-06-15 NOTE — Progress Notes (Signed)
Patient ID: Tina Watkins, female   DOB: [birth year], 46 y.o.   MRN: [id number]   [facility] Progress Note    Subjective:   Feels OK, up walking in the halls.  Less allograft pain but still back pain.  Creatinine has gone up and UOP has gone down   Objective:   BP 101/65  Pulse 62  Temp(Src) 98.3 F (36.8 C) (Oral)  Resp 18  Ht 4\' 11"  (1.499 m)  Wt 58.378 kg (128 lb 11.2 oz)  BMI 25.99 kg/m2  SpO2 100%  Intake/Output Summary (Last 24 hours) at 06/15/11 1412 Last data filed at 06/15/11 0900  Gross per 24 hour  Intake   1063 ml  Output    650 ml  Net    413 ml   Weight change: 0.078 kg (2.8 oz)  Physical Exam: [id number] ambulating in room [id number] RRR, normal S1 and S2  Resp:CTA bilaterally, no rales/rhonchi [id number], Obese, mildly tender over lower quadrants Ext:No LE edema  Imaging: No results found.  Labs: BMET  Lab 06/15/11 0527 06/14/11 0914 06/13/11 0520 06/12/11 2128 06/12/11 1248  NA 140 141 139 -- 137  K 3.9 3.5 4.5 -- 3.8  CL 105 106 102 -- 99  CO2 24 23 23  -- 27  GLUCOSE 76 107* 91 -- 81  BUN 23 27* 28* -- 25*  CREATININE 2.19* 1.85* 1.91* 1.80* 1.68*  ALB -- -- -- -- --  CALCIUM 9.5 9.2 8.8 -- 9.7  PHOS 3.4 2.7 3.9 -- --   CBC  Lab 06/15/11 0527 06/14/11 0914 06/13/11 0520 06/12/11 2128 06/12/11 1248  WBC 3.5* 3.8* 5.7 4.9 --  NEUTROABS -- -- 5.0 -- 5.5  HGB 7.6* 7.7* 7.4* 7.9* --  HCT 23.1* 23.0* 22.3* 23.7* --  MCV 93.9 93.1 93.3 94.8 --  PLT 166 151 149* 138* --    Medications:       . aspirin EC  81 mg Oral Daily  . azaTHIOprine  100 mg Oral Daily  . calcitRIOL  0.25 mcg Oral Daily  . cephALEXin  500 mg Oral Q8H  . clotrimazole  10 mg Oral 5 X Daily  . colchicine  0.3 mg Oral Daily  . darbepoetin (ARANESP) injection - NON-DIALYSIS  100 mcg Subcutaneous Q Sat-1800  . furosemide  80 mg Oral BID  . heparin  5,000 Units Subcutaneous Q8H  . metoCLOPramide  5 mg Oral QHS  . midodrine  10 mg Oral BID  .  mulitivitamin with minerals  1 tablet Oral Daily  . pantoprazole  40 mg Oral Q1200  . predniSONE  5 mg Oral Daily  . rosuvastatin  5 mg Oral q1800  . sodium chloride  500 mL Intravenous Once  . sodium chloride  3 mL Intravenous Q12H  . tacrolimus  2 mg Oral QHS  . tacrolimus  3 mg Oral Daily  . valACYclovir  500 mg Oral Daily  . DISCONTD: atorvastatin  10 mg Oral q1800  . DISCONTD: cefTRIAXone (ROCEPHIN)  IV  1 g Intravenous Q24H  . DISCONTD: cefTRIAXone (ROCEPHIN)  IV  1 g Intravenous Q24H  . DISCONTD: cephALEXin  500 mg Oral Q6H  . DISCONTD: colchicine  0.6 mg Oral Daily  . DISCONTD: piperacillin-tazobactam (ZOSYN)  IV  3.375 g Intravenous Q8H     Assessment/ Plan:   1. UTI / urosepsis with renal allograft site pain: Negative CT for abscess/obstruction/calculi. Renal function worsened for unclear reasons because otherwise clinically is doing well.  E coli appears  fairly sensitive, now changed to PO abx  2. Hypotension: BP improved/stable s/p IVFs and midodrine 3. Anemia: Likely anemia of chronic kidney disease compounded by recent infection/hospitalizations and inflammatory resistance to erythropoietin. On ESA 4. Gastroparesis: Continue metoclopramide therapy.  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 Dispo- As long as creatinine does not increase further tomorrow, will be OK for discharge and can follow as OP   Merlina Marchena A  06/15/2011, 2:12 PM

## 2011-06-16 ENCOUNTER — Inpatient Hospital Stay (HOSPITAL_COMMUNITY): Payer: Medicaid Other

## 2011-06-16 LAB — CBC
HCT: 23.2 % — ABNORMAL LOW (ref 36.0–46.0)
Hemoglobin: 7.6 g/dL — ABNORMAL LOW (ref 12.0–15.0)
MCH: 30.8 pg (ref 26.0–34.0)
MCHC: 32.8 g/dL (ref 30.0–36.0)
MCV: 93.9 fL (ref 78.0–100.0)
Platelets: 196 10*3/uL (ref 150–400)
RBC: 2.47 MIL/uL — ABNORMAL LOW (ref 3.87–5.11)
RDW: 20.3 % — ABNORMAL HIGH (ref 11.5–15.5)
WBC: 2.6 10*3/uL — ABNORMAL LOW (ref 4.0–10.5)

## 2011-06-16 LAB — BASIC METABOLIC PANEL
BUN: 24 mg/dL — ABNORMAL HIGH (ref 6–23)
CO2: 22 mEq/L (ref 19–32)
Calcium: 9.5 mg/dL (ref 8.4–10.5)
Chloride: 105 mEq/L (ref 96–112)
Creatinine, Ser: 2.55 mg/dL — ABNORMAL HIGH (ref 0.50–1.10)
GFR calc Af Amer: 25 mL/min — ABNORMAL LOW (ref >90)
GFR calc non Af Amer: 22 mL/min — ABNORMAL LOW (ref >90)
Glucose, Bld: 81 mg/dL (ref 70–99)
Potassium: 3.9 mEq/L (ref 3.5–5.1)
Sodium: 140 mEq/L (ref 135–145)

## 2011-06-16 LAB — RENAL FUNCTION PANEL
Albumin: 3.6 g/dL (ref 3.5–5.2)
BUN: 23 mg/dL (ref 6–23)
CO2: 23 mEq/L (ref 19–32)
Calcium: 9.7 mg/dL (ref 8.4–10.5)
Chloride: 103 mEq/L (ref 96–112)
Creatinine, Ser: 2.97 mg/dL — ABNORMAL HIGH (ref 0.50–1.10)
GFR calc Af Amer: 21 mL/min — ABNORMAL LOW (ref >90)
GFR calc non Af Amer: 18 mL/min — ABNORMAL LOW (ref >90)
Glucose, Bld: 102 mg/dL — ABNORMAL HIGH (ref 70–99)
Phosphorus: 3.2 mg/dL (ref 2.3–4.6)
Potassium: 3.9 mEq/L (ref 3.5–5.1)
Sodium: 139 mEq/L (ref 135–145)

## 2011-06-16 MED ORDER — OXYCODONE HCL 5 MG PO TABS
5.0000 mg | ORAL_TABLET | Freq: Four times a day (QID) | ORAL | Status: DC | PRN
  Administered 2011-06-16 – 2011-06-18 (×5): 5 mg via ORAL
  Filled 2011-06-16 (×5): qty 1

## 2011-06-16 MED ORDER — ALPRAZOLAM 0.25 MG PO TABS
0.2500 mg | ORAL_TABLET | Freq: Two times a day (BID) | ORAL | Status: DC | PRN
  Administered 2011-06-16 – 2011-06-22 (×7): 0.25 mg via ORAL
  Filled 2011-06-16 (×7): qty 1

## 2011-06-16 MED ORDER — LORAZEPAM 0.5 MG PO TABS
0.5000 mg | ORAL_TABLET | Freq: Four times a day (QID) | ORAL | Status: DC | PRN
  Administered 2011-06-16: 0.5 mg via ORAL
  Filled 2011-06-16: qty 1

## 2011-06-16 MED ORDER — PIPERACILLIN-TAZOBACTAM IN DEX 2-0.25 GM/50ML IV SOLN
2.2500 g | Freq: Three times a day (TID) | INTRAVENOUS | Status: DC
  Administered 2011-06-16 – 2011-06-21 (×13): 2.25 g via INTRAVENOUS
  Filled 2011-06-16 (×17): qty 50

## 2011-06-16 MED ORDER — SODIUM CHLORIDE 0.9 % IV SOLN
INTRAVENOUS | Status: DC
  Administered 2011-06-16 – 2011-06-18 (×4): via INTRAVENOUS

## 2011-06-16 MED ORDER — PIPERACILLIN-TAZOBACTAM 3.375 G IVPB: 3.3750 g | Freq: Once | INTRAVENOUS | Status: DC

## 2011-06-16 MED ORDER — OXYCODONE-ACETAMINOPHEN 10-650 MG PO TABS: 1.0000 | ORAL_TABLET | Freq: Four times a day (QID) | ORAL | Status: DC | PRN

## 2011-06-16 MED ORDER — OXYCODONE-ACETAMINOPHEN 5-325 MG PO TABS
1.0000 | ORAL_TABLET | Freq: Four times a day (QID) | ORAL | Status: DC | PRN
  Administered 2011-06-17 – 2011-06-18 (×4): 1 via ORAL
  Filled 2011-06-16 (×4): qty 1

## 2011-06-16 MED ORDER — METHYLPREDNISOLONE SODIUM SUCC 40 MG IJ SOLR
40.0000 mg | Freq: Every day | INTRAMUSCULAR | Status: DC
  Filled 2011-06-16 (×2): qty 1

## 2011-06-16 MED ORDER — SODIUM CHLORIDE 0.9 % IV BOLUS (SEPSIS)
250.0000 mL | Freq: Once | INTRAVENOUS | Status: AC
  Administered 2011-06-16: 250 mL via INTRAVENOUS

## 2011-06-16 MED ORDER — PREDNISONE 20 MG PO TABS
20.0000 mg | ORAL_TABLET | Freq: Once | ORAL | Status: AC
  Administered 2011-06-16: 20 mg via ORAL
  Filled 2011-06-16: qty 1

## 2011-06-16 MED ORDER — PIPERACILLIN-TAZOBACTAM 3.375 G IVPB 30 MIN
3.3750 g | Freq: Once | INTRAVENOUS | Status: AC
  Administered 2011-06-16: 3.375 g via INTRAVENOUS
  Filled 2011-06-16: qty 50

## 2011-06-16 NOTE — Progress Notes (Signed)
ANTIBIOTIC CONSULT NOTE - INITIAL  Pharmacy Consult for Zosyn Indication: UTI / urosepsis  Allergies  Allergen Reactions  . Levofloxacin Rash    Patient Measurements: Height: 4\' 11"  (149.9 cm) Weight: 128 lb 10.9 oz (58.37 kg) IBW/kg (Calculated) : 43.2    Vital Signs: Temp: 98.1 F (36.7 C) (05/14 1014) Temp src: Oral (05/14 1014) BP: 115/69 mmHg (05/14 1014) Pulse Rate: 57  (05/14 1014) Intake/Output from previous day: 05/13 0701 - 05/14 0700 In: 963 [P.O.:720; I.V.:3; IV Piggyback:240] Out: 200 [Urine:200] Intake/Output from this shift: Total I/O In: 240 [P.O.:240] Out: -   Labs:  Basename 06/16/11 0500 06/15/11 0527 06/14/11 0914  WBC 2.6* 3.5* 3.8*  HGB 7.6* 7.6* 7.7*  PLT 196 166 151  LABCREA -- -- --  CREATININE 2.55* 2.19* 1.85*   Estimated Creatinine Clearance: 22.1 ml/min (by C-G formula based on Cr of 2.55). No results found for this basename: VANCOTROUGH:2,VANCOPEAK:2,VANCORANDOM:2,GENTTROUGH:2,GENTPEAK:2,GENTRANDOM:2,TOBRATROUGH:2,TOBRAPEAK:2,TOBRARND:2,AMIKACINPEAK:2,AMIKACINTROU:2,AMIKACIN:2, in the last 72 hours   Medical History: Past Medical History  Diagnosis Date  . Gastroparesis   . Hx of kidney transplant   . Gout   . Hypotension   . Herpes genitalia   . Stroke      left basal ganglia lacunar infarct; Right frontal lobe lacunar infarct.    Assessment: 36 YOF with ESRD s/p kidney transplant, now having E.coli UTI and bacteremia. Cultures only resistant to Ampicillin and nitrofurantoin. Pt has been treated with zosyn for 2 days, then switched to PO keflex yesterday, now with worsened renal function, will switch back to zosyn. Current scr = 2.55<-2.19<-1.85. Est. crcl 22 ml/min. One dose of zosyn 3.375 g was ordered for today at 1400  Antibiotics: Rocephin -  5/10 >> 5/12 Zosyn - 5/11 >> 5/12 Keflex - 5/13 >> 5/14 Zosyn - 5/14 >>  Plan:  - Zosyn 2.25g IV Q 8hrs start at 2200 - f/u clinical course and continue monitor renal  function  Maryanna Shape, PharmD, BCPS  Clinical Pharmacist  Pager: [phone]   06/16/2011,1:12 PM

## 2011-06-16 NOTE — Progress Notes (Signed)
Patient ID: Tina Watkins, female   DOB: [birth year], 46 y.o.   MRN: [id number]   [facility] Progress Note    Subjective:   Very upset because cannot go home, cannot start IV, has many questions.  No pain at allograft site and afebrile.  Creatinine is going up, UOP not well recorded , patient not sure if it is less or not   Objective:   BP 115/69  Pulse 57  Temp(Src) 98.1 F (36.7 C) (Oral)  Resp 17  Ht 4\' 11"  (1.499 m)  Wt 58.37 kg (128 lb 10.9 oz)  BMI 25.99 kg/m2  SpO2 99%  Intake/Output Summary (Last 24 hours) at 06/16/11 1130 Last data filed at 06/16/11 0900  Gross per 24 hour  Intake    963 ml  Output    200 ml  Net    763 ml   Weight change: -0.008 kg (-0.3 oz)  Physical Exam: Gen: visibly upset [id number] RRR, normal S1 and S2  Resp:CTA bilaterally, no rales/rhonchi [id number], Obese, mildly tender over lower quadrants Ext:No LE edema  Imaging: No results found.  Labs: BMET  Lab 06/16/11 0500 06/15/11 0527 06/14/11 0914 06/13/11 0520 06/12/11 2128 06/12/11 1248  NA 140 140 141 139 -- 137  K 3.9 3.9 3.5 4.5 -- 3.8  CL 105 105 106 102 -- 99  CO2 22 24 23 23  -- 27  GLUCOSE 81 76 107* 91 -- 81  BUN 24* 23 27* 28* -- 25*  CREATININE 2.55* 2.19* 1.85* 1.91* 1.80* 1.68*  ALB -- -- -- -- -- --  CALCIUM 9.5 9.5 9.2 8.8 -- 9.7  PHOS -- 3.4 2.7 3.9 -- --   CBC  Lab 06/16/11 0500 06/15/11 0527 06/14/11 0914 06/13/11 0520 06/12/11 1248  WBC 2.6* 3.5* 3.8* 5.7 --  NEUTROABS -- -- -- 5.0 5.5  HGB 7.6* 7.6* 7.7* 7.4* --  HCT 23.2* 23.1* 23.0* 22.3* --  MCV 93.9 93.9 93.1 93.3 --  PLT 196 166 151 149* --    Medications:       . aspirin EC  81 mg Oral Daily  . azaTHIOprine  100 mg Oral Daily  . calcitRIOL  0.25 mcg Oral Daily  . cephALEXin  500 mg Oral Q8H  . clotrimazole  10 mg Oral 5 X Daily  . colchicine  0.3 mg Oral Daily  . darbepoetin (ARANESP) injection - NON-DIALYSIS  100 mcg Subcutaneous Q Sat-1800  . heparin  5,000 Units  Subcutaneous Q8H  . metoCLOPramide  5 mg Oral QHS  . midodrine  10 mg Oral BID  . mulitivitamin with minerals  1 tablet Oral Daily  . pantoprazole  40 mg Oral Q1200  . predniSONE  5 mg Oral Daily  . rosuvastatin  5 mg Oral q1800  . sodium chloride  250 mL Intravenous Once  . sodium chloride  3 mL Intravenous Q12H  . tacrolimus  2 mg Oral QHS  . tacrolimus  3 mg Oral Daily  . valACYclovir  500 mg Oral Daily  . DISCONTD: cefTRIAXone (ROCEPHIN)  IV  1 g Intravenous Q24H  . DISCONTD: cephALEXin  500 mg Oral Q6H  . DISCONTD: furosemide  80 mg Oral BID  . DISCONTD: sodium chloride  500 mL Intravenous Once     Assessment/ Plan:   1. UTI / urosepsis with renal allograft site pain: Negative CT for abscess/obstruction/calculi initially Renal function worsened for unclear reasons because otherwise clinically is doing well.  E coli appears fairly sensitive, but given  that creatinine is worsening would feel better if back on IV antibiotics for her bacteremia.  Will also check another renal ultrasound/ U/A .  Will also increase steroids  2. Hypotension: BP improved/stable on midodrine.  Question whether renal function took a little hit with low BP.  Agree with d/c of lasix and hydration as well.  Increased steroids should help this issue as well. 3. Anemia: Likely anemia of chronic kidney disease compounded by recent infection/hospitalizations and inflammatory resistance to erythropoietin. On ESA 4. Gastroparesis: Continue metoclopramide therapy.  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 Dispo- If work up remains negative and creatinine continues to go up, may need transfer to a transplant center for allograft biopsy.    Tina Watkins A  06/16/2011, 11:30 AM

## 2011-06-16 NOTE — ED Provider Notes (Signed)
Medical screening examination/treatment/procedure(s) were conducted as a shared visit with non-physician practitioner(s) and myself.  I personally evaluated the patient during the encounter  UTI symptoms, RLQ pain over kidney transplant site.  Borderline BP, chronic problem on midodrine but history of severe sepsis last month.  Ezequiel Essex, MD 06/16/11 [phone]

## 2011-06-16 NOTE — Progress Notes (Signed)
Utilization review completed. Lizabeth Leyden RN, CCM

## 2011-06-16 NOTE — Progress Notes (Signed)
Family Medicine Teaching Service Daily Resident Note   Patient name: Tina Watkins Medical record number: [id number] Date of birth: 10/15/1965 Age: 46 y.o. Gender: female Length of Stay:  LOS: 4 days             SUBJECTIVE & OVERNIGHT EVENTS  Pt tearful regarding having to stay hospitalized but reports improvement in her pain and feeling overall much improved.              OBJECTIVE  Vitals: Patient Vitals for the past 24 hrs:  BP Temp Temp src Pulse Resp SpO2 Weight  06/16/11 0500 117/73 mmHg 98.6 F (37 C) Oral 56  18  96 % -  06/15/11 2015 94/62 mmHg 98.1 F (36.7 C) Oral 56  18  97 % 128 lb 10.9 oz (58.37 kg)  06/15/11 1800 94/57 mmHg 98.6 F (37 C) Oral 84  18  98 % -  06/15/11 1400 110/70 mmHg 98 F (36.7 C) Oral 67  18  98 % -  06/15/11 1000 101/65 mmHg 98.3 F (36.8 C) Oral 62  18  100 % -   Wt Readings from Last 3 Encounters:  06/15/11 128 lb 10.9 oz (58.37 kg)  05/29/11 135 lb 5.8 oz (61.4 kg)  02/23/11 131 lb (59.421 kg)    Intake/Output Summary (Last 24 hours) at 06/16/11 [id number] Last data filed at 06/16/11 0507  Gross per 24 hour  Intake    723 ml  Output    200 ml  Net    523 ml    PE: GENERAL:  Adult AA female.  Examined in [facility] ED.  Laying in bed  In mild discomfort; norespiratory distress.   PSYCH: Alert and appropriately interactive; Insight:Good  Alert, oriented, thought content appropriate, alertness: alert, thought content exhibits logical connections;  H&N: AT/[place], MMM, no scleral icterus, EOMi, mild swelling of face bilaterally THORAX: HEART: RRR, S1/S2 heard, 2+/6 systolic murmur over R sternal boarder LUNGS: CTA B, no wheezes, no crackles ABDOMEN:  +BS, soft, non-tender, no rigidity, no guarding, no masses/organomegaly EXTREMITIES: Moves all 4 extremities spontaneously, warm well perfused, no edema, bilateral DP and PT pulses 2+/4.   LABS:  Lab 06/16/11 0500 06/15/11 0527 06/14/11 0914 06/13/11 0520 06/12/11 2128 06/12/11 1248  WBC 2.6* 3.5*  3.8* 5.7 4.9 6.2  HGB 7.6* 7.6* 7.7* 7.4* 7.9* 8.1*  HCT 23.2* 23.1* 23.0* 22.3* 23.7* 24.2*  PLT 196 166 151 149* 138* 171    Lab 06/16/11 0500 06/15/11 0527 06/14/11 0914 06/13/11 0520 06/12/11 2128 06/12/11 1248  NA 140 140 141 139 -- 137  K 3.9 3.9 3.5 4.5 -- 3.8  CL 105 105 106 102 -- 99  CO2 22 24 23 23  -- 27  BUN 24* 23 27* 28* -- 25*  CREATININE 2.55* 2.19* 1.85* 1.91* 1.80* 1.68*  CALCIUM 9.5 9.5 9.2 8.8 -- 9.7  GLUCOSE 81 76 107* 91 -- 81    Metabolic:  Lab [id number] 06/14/11 0914 06/13/11 0520  MG -- 1.7 1.6  PHOS 3.4 2.7 3.9    Lab 06/13/11 0520  ALT 19  AST 23  ALKPHOS 77  BILITOT 0.7  BILIDIR --    Lab 06/15/11 0527 06/14/11 0914 06/13/11 0520  PROT -- -- 5.7*  ALBUMIN 3.2* 3.0* 3.0*  PREALBUMIN -- -- --    Lab 06/13/11 1031  IRON <10*  TIBC Not calculated due to Iron <10.  FERRITIN 1533*     Hematology:  Lab 06/13/11 0520 06/12/11 1248  LYMPHSABS  0.3* 0.4*  MONOABS 0.4 0.3  EOSABS 0.0 0.0  BASOSABS 0.0 0.0    Lab 06/16/11 0500 06/15/11 0527 06/14/11 0914  RDW 20.3* 20.7* 20.4*  MCV 93.9 93.9 93.1  MCHC 32.8 32.9 33.5  MRET -- -- --    Urinalysis  Lab 06/12/11 1323  COLORURINE YELLOW  APPEARANCEUR CLEAR  LABSPEC 1.007  PHURINE 6.0  GLUCOSEU NEGATIVE  HGBUR SMALL*  BILIRUBINUR NEGATIVE  KETONESUR NEGATIVE  PROTEINUR NEGATIVE  UROBILINOGEN 0.2  NITRITE NEGATIVE  LEUKOCYTESUR SMALL*    IMAGING: (in last 48 Hours) No results found.  MICRO: 5/10 < > Urine Culture - E. Coli - sensitive to cephalasporins 5/10 < > Blood Culture X 2 - E. Coli - sensitive to cephalasporins  MEDS: I have reviewed all medications and made changes as below.            [facility] Day [facility]37 46 y.o. year old female with ESRD s/p 3 Renal Transplants presenting with RLQ pain over transplant site and reported fever at home.  Associated nausea/vomiting.  #  ID & IMMUNOLOGY: (Immunosuppression in Renal Transplant Pt, Herpes Simplex,  Urinary Tract Infection, Bacteremia, Oral thrush) - Pt on chronic immunosuppressive medications for renal transplant.  Concern is raised due to diminished immune response and may be late to mount an appropriate leukocytosis.  Given recurrent UTI will obtain CT as above to assess for abscess  [facility] Course   *Keflex *Valtrex *Azathioprine *Tacrolimus *Prednisone *Clotrimazole   Pt continued on home immunosuppression regimen at time of admission  + E Coli UTI with Bacteremia > on Rocephin > Zosyn > keflex                                      Plan / Followup  - Transitioned to keflex after 48o of IV ABX - currently afebrile >24o - Continue to monitor for fever - Continues to be AFEBRILE > no indication for re-esclation of ABX or repeat blood cultures; see renal section below    #  ABDOMINAL PAIN - improved - renal allograft site pain - Acute onset as of day of admission.  Located over RLQ at transplant site.  Pt reports this pain is not like her pain with prior transplants or with her last serious UTI.    [facility] Course   *Oxycodone (home med) *Phergan *Reglan   No imaging indicated at time of admission as found to have UTI  After discussing case with Dr. Posey Pronto decision to obtain CT Abdomen/Pelvis to asses for abscess  CT abdomen pelvis negative for acute process                                      Plan / Followup  Resolved - pt was walking off of floor and reporting feeling at baseline   #  Renal: (ESRD s/p renal allograft) - No evidence of AKI; Cr appears to be improved from prior hospitalization - Pt volunteered she was not interested in dialysis if needed in future and to "save her kidney"  [facility] Course   *Calcitriol   Cr @ admission 1.68  Specific Gravity 1.006; but appears clinically dry  5/13 - Cr worsening to 2.19 > 2.55  Plan / Followup  Continue to monitor; consider ATN if  subclinical hypotensive event with bactermia - STOPPED LASIX AND STARTED IVFs with BOLUS    #  GOUT:  - Pt with long standing history; received injection in her L 1st MTP earlier in week of admission  [facility] Course   *Colchicine  Home regimen continued                                      Plan / Followup      #  CV: (Hypotension, remote hx of CVA) - Pt BP stable at time of admission; provided fluid bolus and continued home midrodrine - no evidence of CVA on initial exam and pt doesn't report any worsening in neuro function but does report she is concerned her problems could be from a stroke  [place]  BP stable throughout not requiring further intervention                                      Plan / Followup  Continue to monitor - consider subclinical hypotensive episode leading to ATN given worsening renal function   # ANEMIA - Likely anemia of chronic disease with Iron defiency;  On Aranesp; Defer further plan to renal;   [ ]  CONSIDER IV IRON  # Facial Edema - 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  Will hold at admission, if symptomatic will consider longer acting benzo (i.e. Ativan) - On home Crestor > Lipitor on [facility]   *PPI *Reglan *Ambien *Lipitor *ATIVAN                                       Plan / Followup  ATIVAN FOR ANXIETY   --- FEN  *250NS Bolus + NS @ 163ml/hr - continue to encourage po intake -Renal --- PPx: Heparin & PPI            DISPOSITION  Pt will remain on po ABX as no recurrent fever.  Consider follow up test of cure.  Pts creatinine worsening; likely pre-renal given decreased UOP and continued lasix therapy.  Will stop and hydrate and  provide close monitoring.  Appreciate Renal Recommendations.  Consider FENa.     Gerda Diss, DO [facility] Resident - PGY-1 06/16/2011 9:29 AM

## 2011-06-16 NOTE — Progress Notes (Signed)
FMTS Attending Note  Patient seen and examined by me; expresses significant anxiety about continued increase in Creatinine.  Denies pain and has remained afebrile.  Concern for possible ATN from mild hypoperfusion as cause of acute renal injury.  Plan to increase hydration and hold diuretics.  Patient has tenuous iv access at present.  Increase in steroids and return to iv abx as per renal note.  She notes that Ativan not helping with her anxiety as well as alprazolam had been helping. Dalbert Mayotte, MD

## 2011-06-17 LAB — URINALYSIS, ROUTINE W REFLEX MICROSCOPIC
Bilirubin Urine: NEGATIVE
Glucose, UA: NEGATIVE mg/dL
Hgb urine dipstick: NEGATIVE
Ketones, ur: 15 mg/dL — AB
Leukocytes, UA: NEGATIVE
Nitrite: NEGATIVE
Protein, ur: 100 mg/dL — AB
Specific Gravity, Urine: 1.022 (ref 1.005–1.030)
Urobilinogen, UA: 0.2 mg/dL (ref 0.0–1.0)
pH: 5 (ref 5.0–8.0)

## 2011-06-17 LAB — RENAL FUNCTION PANEL
Albumin: 3.4 g/dL — ABNORMAL LOW (ref 3.5–5.2)
Albumin: 3.4 g/dL — ABNORMAL LOW (ref 3.5–5.2)
BUN: 23 mg/dL (ref 6–23)
BUN: 25 mg/dL — ABNORMAL HIGH (ref 6–23)
CO2: 21 mEq/L (ref 19–32)
CO2: 22 mEq/L (ref 19–32)
Calcium: 9.5 mg/dL (ref 8.4–10.5)
Calcium: 9.2 mg/dL (ref 8.4–10.5)
Chloride: 106 mEq/L (ref 96–112)
Chloride: 106 mEq/L (ref 96–112)
Creatinine, Ser: 2.93 mg/dL — ABNORMAL HIGH (ref 0.50–1.10)
Creatinine, Ser: 2.98 mg/dL — ABNORMAL HIGH (ref 0.50–1.10)
GFR calc Af Amer: 21 mL/min — ABNORMAL LOW (ref >90)
GFR calc Af Amer: 21 mL/min — ABNORMAL LOW (ref >90)
GFR calc non Af Amer: 19 mL/min — ABNORMAL LOW (ref >90)
GFR calc non Af Amer: 18 mL/min — ABNORMAL LOW (ref >90)
Glucose, Bld: 119 mg/dL — ABNORMAL HIGH (ref 70–99)
Glucose, Bld: 102 mg/dL — ABNORMAL HIGH (ref 70–99)
Phosphorus: 3.7 mg/dL (ref 2.3–4.6)
Phosphorus: 3.1 mg/dL (ref 2.3–4.6)
Potassium: 4.6 mEq/L (ref 3.5–5.1)
Potassium: 4 mEq/L (ref 3.5–5.1)
Sodium: 141 mEq/L (ref 135–145)
Sodium: 139 mEq/L (ref 135–145)

## 2011-06-17 LAB — URINE MICROSCOPIC-ADD ON

## 2011-06-17 LAB — CBC
HCT: 23.4 % — ABNORMAL LOW (ref 36.0–46.0)
Hemoglobin: 7.6 g/dL — ABNORMAL LOW (ref 12.0–15.0)
MCH: 30.2 pg (ref 26.0–34.0)
MCHC: 32.5 g/dL (ref 30.0–36.0)
MCV: 92.9 fL (ref 78.0–100.0)
Platelets: 227 10*3/uL (ref 150–400)
RBC: 2.52 MIL/uL — ABNORMAL LOW (ref 3.87–5.11)
RDW: 20.7 % — ABNORMAL HIGH (ref 11.5–15.5)
WBC: 5.2 10*3/uL (ref 4.0–10.5)

## 2011-06-17 MED ORDER — SODIUM CHLORIDE 0.9 % IV BOLUS (SEPSIS)
500.0000 mL | Freq: Once | INTRAVENOUS | Status: AC
  Administered 2011-06-17: 500 mL via INTRAVENOUS

## 2011-06-17 MED ORDER — METHYLPREDNISOLONE SODIUM SUCC 40 MG IJ SOLR
40.0000 mg | Freq: Every day | INTRAMUSCULAR | Status: DC
  Administered 2011-06-18 – 2011-06-20 (×3): 40 mg via INTRAVENOUS
  Filled 2011-06-17 (×4): qty 1

## 2011-06-17 NOTE — Progress Notes (Signed)
Family Medicine Teaching Service Daily Resident Note   Patient name: Tina Watkins Medical record number: [id number] Date of birth: 1965/03/29 Age: 46 y.o. Gender: female Length of Stay:  LOS: 5 days             SUBJECTIVE & OVERNIGHT EVENTS  Pt reports pain is better.  No complaints from her gout flare.  Abdominal pain resolved.            OBJECTIVE  Vitals: Patient Vitals for the past 24 hrs:  BP Temp Temp src Pulse Resp SpO2 Weight  06/17/11 0511 128/77 mmHg 98.3 F (36.8 C) Oral 57  17  100 % -  06/17/11 0252 - 98 F (36.7 C) Oral - - - -  06/16/11 2119 91/67 mmHg 99.4 F (37.4 C) Oral 66  17  91 % 135 lb (61.236 kg)  06/16/11 1644 93/59 mmHg 98.3 F (36.8 C) Oral 64  17  96 % -  06/16/11 1414 114/87 mmHg 98.6 F (37 C) Oral 56  17  98 % -  06/16/11 1014 115/69 mmHg 98.1 F (36.7 C) Oral 57  17  99 % -   Wt Readings from Last 3 Encounters:  06/16/11 135 lb (61.236 kg)  05/29/11 135 lb 5.8 oz (61.4 kg)  02/23/11 131 lb (59.421 kg)    Intake/Output Summary (Last 24 hours) at 06/17/11 0841 Last data filed at 06/17/11 0700  Gross per 24 hour  Intake 2101.67 ml  Output      0 ml  Net 2101.67 ml    PE: GENERAL:  Adult AA female.  Examined in [facility] 6700.  Laying in bed  In mild discomfort; norespiratory distress.   PSYCH: Alert and appropriately interactive; Insight:Good  Alert, oriented, thought content appropriate, alertness: alert, thought content exhibits logical connections;  H&N: AT/[place], MMM, no scleral icterus, EOMi, mild swelling of face bilaterally THORAX: HEART: RRR, S1/S2 heard, diminished heart sounds; no murmur appreciated today LUNGS: CTA B, no wheezes, no crackles ABDOMEN:  +BS, soft, non-tender, no rigidity, no guarding, renal allograft site non-tender EXTREMITIES: Moves all 4 extremities spontaneously, warm well perfused, no edema, bilateral DP and PT pulses 2+/4.   LABS:  Lab 06/16/11 0500 06/15/11 0527 06/14/11 0914 06/13/11 0520 06/12/11 2128  06/12/11 1248  WBC 2.6* 3.5* 3.8* 5.7 4.9 6.2  HGB 7.6* 7.6* 7.7* 7.4* 7.9* 8.1*  HCT 23.2* 23.1* 23.0* 22.3* 23.7* 24.2*  PLT 196 166 151 149* 138* 171    Lab 06/17/11 0510 06/16/11 1613 06/16/11 0500 06/15/11 0527 06/14/11 0914 06/13/11 0520 06/12/11 2128 06/12/11 1248  NA 141 139 140 140 141 139 -- 137  K 4.6 3.9 3.9 3.9 3.5 4.5 -- 3.8  CL 106 103 105 105 106 102 -- 99  CO2 21 23 22 24 23 23  -- 27  BUN 23 23 24* 23 27* 28* -- 25*  CREATININE 2.93* 2.97* 2.55* 2.19* 1.85* 1.91* 1.80* 1.68*  CALCIUM 9.5 9.7 9.5 9.5 9.2 8.8 -- 9.7  GLUCOSE 119* 102* 81 76 107* 91 -- 81    Metabolic:  Lab [id number] 06/16/11 1613 06/15/11 0527 06/14/11 0914 06/13/11 0520  MG -- -- -- 1.7 1.6  PHOS 3.7 3.2 3.4 -- --    Lab 06/13/11 0520  ALT 19  AST 23  ALKPHOS 77  BILITOT 0.7  BILIDIR --    Lab 06/17/11 0510 06/16/11 1613 06/15/11 0527 06/13/11 0520  PROT -- -- -- 5.7*  ALBUMIN 3.4* 3.6 3.2* --  PREALBUMIN -- -- -- --  Lab 06/13/11 1031  IRON <10*  TIBC Not calculated due to Iron <10.  FERRITIN 1533*   Hematology:  Lab 06/13/11 0520 06/12/11 1248  LYMPHSABS 0.3* 0.4*  MONOABS 0.4 0.3  EOSABS 0.0 0.0  BASOSABS 0.0 0.0    Lab 06/16/11 0500 06/15/11 0527 06/14/11 0914  RDW 20.3* 20.7* 20.4*  MCV 93.9 93.9 93.1  MCHC 32.8 32.9 33.5  MRET -- -- --    Urinalysis  Lab 06/17/11 0345 06/12/11 1323  COLORURINE YELLOW YELLOW  APPEARANCEUR CLOUDY* CLEAR  LABSPEC 1.022 1.007  PHURINE 5.0 6.0  GLUCOSEU NEGATIVE NEGATIVE  HGBUR NEGATIVE SMALL*  BILIRUBINUR NEGATIVE NEGATIVE  KETONESUR 15* NEGATIVE  PROTEINUR 100* NEGATIVE  UROBILINOGEN 0.2 0.2  NITRITE NEGATIVE NEGATIVE  LEUKOCYTESUR NEGATIVE SMALL*   IMAGING: (in last 48 Hours) US Renal  06/16/2011  *RADIOLOGY REPORT*  Clinical Data: Elevated creatinine.  Rising creatinine.  RENAL/URINARY TRACT ULTRASOUND COMPLETE  Comparison:  06/12/2011.  Findings:  Right iliac fossa renal allograft is present. Long axis  measurement is 10.9 cm.  Previously this measured 12.4 cm. This is probably due to technical differences as the kidney has ranged in size on prior examinations.  Central sinus echo complex appears normal.  No hydronephrosis. Doppler wave forms were not requested or performed.  Bladder:  Normal  IMPRESSION: Uncomplicated appearance of right iliac fossa renal allograft.  No hydronephrosis.  Original Report Authenticated By: Dereck Ligas, M.D.   MICRO: 5/10 < > Urine Culture - E. Coli - sensitive to cephalasporins 5/10 < > Blood Culture X 2 - E. Coli - sensitive to cephalasporins  MEDS: I have reviewed all medications and made changes as below.            [place] [facility] Day #70 46 y.o. year old female with ESRD s/p 3 Renal Transplants presenting with RLQ pain over transplant site and reported fever at home.  Associated nausea/vomiting.  #  ID & IMMUNOLOGY: (Immunosuppression in Renal Transplant Pt, Herpes Simplex, Urinary Tract Infection, Bacteremia, Oral thrush, Neutropenic) - Pt on chronic immunosuppressive medications for renal transplant.  Concern is raised due to diminished immune response and may be late to mount an appropriate leukocytosis.    [facility] Course   **Zosyn [5/14 >>> ] *Valtrex *Azathioprine *Tacrolimus *Prednisone *Clotrimazole  X 1 in ED(5/10>5/12) (5/13>5/14)  Pt continued on home immunosuppression regimen at time of admission  + E Coli UTI with Bacteremia > on Rocephin > Zosyn > keflex  5/10 CT Abdomen: No acute findings; no abscess; no hydronephrosis; other chronic changes  5/13 Transitioned to PO ABX given sensitivities and afebrile  5/14 Worsening Renal Function with ?infectious contribution > IV ABX                                      Plan / Followup  - Continue IV ABX until evidence of renal improvement   #  Renal: (ESRD s/p renal allograft) - No evidence of AKI on time of admission; Cr appears to be improved from prior  hospitalization - Pt volunteered she was not interested in dialysis if needed in future and to "save her kidney"  [facility] Course   *Calcitriol  > 5/15  Cr @ admission 1.68  On admission: Specific Gravity 1.006; but appears clinically dry  Cr worsening:  Lab 06/17/11 0510 06/16/11 1613 06/16/11 0500 06/15/11 0527 06/14/11 0914 06/13/11 0520 06/12/11 2128  06/12/11 1248  CREATININE 2.93* 2.97* 2.55* 2.19* 1.85* 1.91* 1.80* 1.68*   5/15 - Repeat UA sg - 1.022 with ketones and trace protein;   5/15 - Poor Urine output (200cc)                                      Plan / Followup  - 500cc Bolus NS IV FLUIDS and increased  To 170ml/hr - Strict I/Os - CONSIDER TRANSFER TO RENAL TRANSPLANT CENTER FOR CONSIDERATION OF RENAL BIOPSY   #  ABDOMINAL PAIN - improved - renal allograft site pain - Acute onset as of day of admission.  Located over RLQ at transplant site.  Pt reports this pain is not like her pain with prior transplants or with her last serious UTI.    [facility] Course   *Oxycodone (home med) *Phergan *Reglan   No imaging indicated at time of admission as found to have UTI  After discussing case with Dr. Posey Pronto decision to obtain CT Abdomen/Pelvis to asses for abscess  CT abdomen pelvis negative for acute process                                      Plan / Followup  Resolved - pt is able to walk off of floor and reporting feeling at baseline      #  GOUT:  - Pt with long standing history; received injection in her L 1st MTP earlier in week of admission  [facility] Course   5/15  Improved following injection as OP                                      Plan / Followup      #  CV: (Hypotension, remote hx of CVA) - Pt BP stable at time of admission; provided fluid bolus and continued home midrodrine - no evidence of CVA on initial exam and pt doesn't report any worsening in neuro function but does report she is concerned her problems  could be from a stroke  [place]  BP stable throughout not requiring further intervention                                      Plan / Followup  Continue to monitor - consider subclinical hypotensive episode leading to ATN given worsening renal function   # ANEMIA & Leukopenia - Likely anemia of chronic disease with Iron defiency;  On Aranesp; Defer further plan to renal;   [ ]  CONSIDER IV IRON once over acute infection  - Leukopenia due to immunosuppresion  - NOT PANCYTOPENIC BUT WILL CONTINUE TO MONITOR  # Facial Edema - 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  Will hold at admission, if symptomatic will consider longer acting benzo (i.e. Ativan) - On home Crestor > Lipitor on [facility] Course   *PPI *Reglan *Ambien   5/14 - Tried Ativan  for anxiety - pt reported no effect > Xanax 5/15 - STATIN HELD                                      Plan / Followup  Home Xanax for Anxiety Will hold STATIN on low likelyhood of renal injury   --- FEN  *500ccNS Bolus + NS @ 170ml/hr - continue to encourage po intake; strict i/o -Renal --- PPx: Heparin & PPI            DISPOSITION  Cr stabilized overnight @ 2.9.  Continue to monitor Pt on IV ABX due to concerns for intrarenal infection leading to worsening of Cr.   Will bolus and increase fluids.   Stopped all nephrotoxic agents.   Steroid Burst for concerns of rejection. Consider transfer to Transplant center. Appreciate Renal Recommendations   Gerda Diss, DO [facility] Resident - PGY-1 06/17/2011 8:43 AM

## 2011-06-17 NOTE — Progress Notes (Signed)
Patient ID: Tina Watkins, female   DOB: 1965/11/02, 46 y.o.   MRN: [id number]   [facility] Progress Note    Subjective:   Still feeling OK.  Were able to finally get IV access, on IVF and back on zosyn.  Ultrasound and U/A were OK.  Creatinine now seems to have peaked at 2.9.  No UOP recorded but patient says that she is making urine   Objective:   BP 130/75  Pulse 70  Temp(Src) 98 F (36.7 C) (Oral)  Resp 18  Ht 4\' 11"  (1.499 m)  Wt 61.236 kg (135 lb)  BMI 27.27 kg/m2  SpO2 94%  Intake/Output Summary (Last 24 hours) at 06/17/11 1028 Last data filed at 06/17/11 0900  Gross per 24 hour  Intake 1981.67 ml  Output      0 ml  Net 1981.67 ml   Weight change: 2.866 kg (6 lb 5.1 oz)  Physical Exam: Gen: NAD [id number] RRR, normal S1 and S2  Resp:CTA bilaterally, no rales/rhonchi [id number], Obese, mildly tender over lower quadrants but better Ext:No LE edema  Imaging: US Renal  06/16/2011  *RADIOLOGY REPORT*  Clinical Data: Elevated creatinine.  Rising creatinine.  RENAL/URINARY TRACT ULTRASOUND COMPLETE  Comparison:  06/12/2011.  Findings:  Right iliac fossa renal allograft is present. Long axis measurement is 10.9 cm.  Previously this measured 12.4 cm. This is probably due to technical differences as the kidney has ranged in size on prior examinations.  Central sinus echo complex appears normal.  No hydronephrosis. Doppler wave forms were not requested or performed.  Bladder:  Normal  IMPRESSION: Uncomplicated appearance of right iliac fossa renal allograft.  No hydronephrosis.  Original Report Authenticated By: Dereck Ligas, M.D.    Labs: BMET  Lab 06/17/11 0510 06/16/11 1613 06/16/11 0500 06/15/11 0527 06/14/11 0914 06/13/11 0520 06/12/11 2128 06/12/11 1248  NA 141 139 140 140 141 139 -- 137  K 4.6 3.9 3.9 3.9 3.5 4.5 -- 3.8  CL 106 103 105 105 106 102 -- 99  CO2 21 23 22 24 23 23  -- 27  GLUCOSE 119* 102* 81 76 107* 91 -- 81  BUN 23 23 24* 23 27* 28*  -- 25*  CREATININE 2.93* 2.97* 2.55* 2.19* 1.85* 1.91* 1.80* --  ALB -- -- -- -- -- -- -- --  CALCIUM 9.5 9.7 9.5 9.5 9.2 8.8 -- 9.7  PHOS 3.7 3.2 -- 3.4 2.7 3.9 -- --   CBC  Lab 06/16/11 0500 06/15/11 0527 06/14/11 0914 06/13/11 0520 06/12/11 1248  WBC 2.6* 3.5* 3.8* 5.7 --  NEUTROABS -- -- -- 5.0 5.5  HGB 7.6* 7.6* 7.7* 7.4* --  HCT 23.2* 23.1* 23.0* 22.3* --  MCV 93.9 93.9 93.1 93.3 --  PLT 196 166 151 149* --    Medications:       . aspirin EC  81 mg Oral Daily  . azaTHIOprine  100 mg Oral Daily  . calcitRIOL  0.25 mcg Oral Daily  . clotrimazole  10 mg Oral 5 X Daily  . darbepoetin (ARANESP) injection - NON-DIALYSIS  100 mcg Subcutaneous Q Sat-1800  . heparin  5,000 Units Subcutaneous Q8H  . methylPREDNISolone (SOLU-MEDROL) injection  40 mg Intravenous Daily  . metoCLOPramide  5 mg Oral QHS  . midodrine  10 mg Oral BID  . mulitivitamin with minerals  1 tablet Oral Daily  . pantoprazole  40 mg Oral Q1200  . piperacillin-tazobactam (ZOSYN)  IV  2.25 g Intravenous Q8H  . piperacillin-tazobactam  3.375 g Intravenous Once  . predniSONE  20 mg Oral Once  . sodium chloride  250 mL Intravenous Once  . sodium chloride  500 mL Intravenous Once  . sodium chloride  3 mL Intravenous Q12H  . tacrolimus  2 mg Oral QHS  . tacrolimus  3 mg Oral Daily  . valACYclovir  500 mg Oral Daily  . DISCONTD: cephALEXin  500 mg Oral Q8H  . DISCONTD: colchicine  0.3 mg Oral Daily  . DISCONTD: methylPREDNISolone (SOLU-MEDROL) injection  40 mg Intravenous Daily  . DISCONTD: piperacillin-tazobactam (ZOSYN)  IV  3.375 g Intravenous Once  . DISCONTD: predniSONE  5 mg Oral Daily  . DISCONTD: rosuvastatin  5 mg Oral q1800     Assessment/ Plan:   1. UTI / urosepsis with renal allograft site pain: Negative CT for abscess/obstruction/calculi initially Renal function worsened for unclear reasons because otherwise clinically is doing well.  E coli appears fairly sensitive, but given that creatinine was  worsening , I feel better if on IV antibiotics for her bacteremia.  Renal ultrasound from yesterday looked fine, U/A looks fine as well.  Creatinine plateauing so see no need at present to need to be transferred.  Stress dose steroids as well.    2. Hypotension: BP improved/stable on midodrine.  Question whether renal function took a little hit with low BP.  Have d/c d lasix, giving IVF and stress dose steroids BP is better.  3. Anemia: Likely anemia of chronic kidney disease compounded by recent infection/hospitalizations and inflammatory resistance to erythropoietin. On ESA 4. Gastroparesis: Continue metoclopramide therapy.  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  Is stable, no intervention needed 6. Dispo- would keep all the same and follow creatinine.    Tina Watkins A  06/17/2011, 10:28 AM

## 2011-06-17 NOTE — Progress Notes (Signed)
  Family Medicine Teaching Service Brief Progress Note   Patient name: Tina Watkins Medical record number: [id number] Date of birth: August 29, 1965 Age: 46 y.o. Gender: female Length of Stay:  LOS: 5 days     Called by nurse Louanne Belton).  Pt wanting to talk with Dr. following results from PM labs.  Also alerted pt has only produced 200cc of urine since 700AM.    Discussed with patient regarding plateau of worsening renal function and reassured this is what is to be expected with plateau phase of ATN.  Also likely oliguric phase explaining lack of UOP.  Bladder scan performed and showed 46cc of residual.  Discussed options with pt who is quite tearful and understandably hypervigilant regarding her kidney.  Will continue to monitor and anticipate improving Cr and impending diuresis phase.  IVFs decreased to 100cc/hr as appears to be beginning to retain fluids with 1+/4 pretibial edema and worsening facial swelling.  Defer further workup to Dr. Clover Mealy.  Gerda Diss, DO [facility] Resident - PGY-1 06/17/2011 9:04 PM

## 2011-06-18 DIAGNOSIS — R7881 Bacteremia: Secondary | ICD-10-CM

## 2011-06-18 DIAGNOSIS — B9620 Unspecified Escherichia coli [E. coli] as the cause of diseases classified elsewhere: Secondary | ICD-10-CM

## 2011-06-18 HISTORY — DX: Bacteremia: R78.81

## 2011-06-18 HISTORY — DX: Unspecified Escherichia coli (E. coli) as the cause of diseases classified elsewhere: B96.20

## 2011-06-18 LAB — RENAL FUNCTION PANEL
Albumin: 3.2 g/dL — ABNORMAL LOW (ref 3.5–5.2)
BUN: 24 mg/dL — ABNORMAL HIGH (ref 6–23)
CO2: 19 mEq/L (ref 19–32)
Calcium: 9.3 mg/dL (ref 8.4–10.5)
Chloride: 108 mEq/L (ref 96–112)
Creatinine, Ser: 3.14 mg/dL — ABNORMAL HIGH (ref 0.50–1.10)
GFR calc Af Amer: 20 mL/min — ABNORMAL LOW (ref >90)
GFR calc non Af Amer: 17 mL/min — ABNORMAL LOW (ref >90)
Glucose, Bld: 80 mg/dL (ref 70–99)
Phosphorus: 3.6 mg/dL (ref 2.3–4.6)
Potassium: 3.6 mEq/L (ref 3.5–5.1)
Sodium: 142 mEq/L (ref 135–145)

## 2011-06-18 MED ORDER — HYDROMORPHONE HCL PF 1 MG/ML IJ SOLN
1.0000 mg | Freq: Four times a day (QID) | INTRAMUSCULAR | Status: DC | PRN
  Administered 2011-06-18 – 2011-06-22 (×6): 1 mg via INTRAVENOUS
  Filled 2011-06-18 (×7): qty 1

## 2011-06-18 MED ORDER — ONDANSETRON 8 MG/NS 50 ML IVPB
8.0000 mg | Freq: Three times a day (TID) | INTRAVENOUS | Status: DC | PRN
  Administered 2011-06-18: 8 mg via INTRAVENOUS
  Filled 2011-06-18: qty 8

## 2011-06-18 NOTE — Progress Notes (Signed)
Patient ID: Tina Watkins, female   DOB: 06/22/1965, 46 y.o.   MRN: [id number]   [facility] Progress Note    Subjective:   Feels bloated and bad. UOP is picking up. Still getting IVF at 75cc/hour    Objective:   BP 108/60  Pulse 111  Temp(Src) 98.8 F (37.1 C) (Oral)  Resp 17  Ht 4\' 11"  (1.499 m)  Wt 63.549 kg (140 lb 1.6 oz)  BMI 28.30 kg/m2  SpO2 100%  Intake/Output Summary (Last 24 hours) at 06/18/11 1124 Last data filed at 06/18/11 0915  Gross per 24 hour  Intake    360 ml  Output    550 ml  Net   -190 ml   Weight change: 2.313 kg (5 lb 1.6 oz)  Physical Exam: Gen: more somnolent today-  [id number] RRR, normal S1 and S2  Resp:CTA bilaterally, no rales/rhonchi [id number], Obese, mildly tender over lower quadrants but better.  Now with epigastric pain Ext- now has edema  Imaging: US Renal  06/16/2011  *RADIOLOGY REPORT*  Clinical Data: Elevated creatinine.  Rising creatinine.  RENAL/URINARY TRACT ULTRASOUND COMPLETE  Comparison:  06/12/2011.  Findings:  Right iliac fossa renal allograft is present. Long axis measurement is 10.9 cm.  Previously this measured 12.4 cm. This is probably due to technical differences as the kidney has ranged in size on prior examinations.  Central sinus echo complex appears normal.  No hydronephrosis. Doppler wave forms were not requested or performed.  Bladder:  Normal  IMPRESSION: Uncomplicated appearance of right iliac fossa renal allograft.  No hydronephrosis.  Original Report Authenticated By: Dereck Ligas, M.D.    Labs: BMET  Lab 06/18/11 0530 06/17/11 1603 06/17/11 0510 06/16/11 1613 06/16/11 0500 06/15/11 0527 06/14/11 0914 06/13/11 0520  NA 142 139 141 139 140 140 141 --  K 3.6 4.0 4.6 3.9 3.9 3.9 3.5 --  CL 108 106 106 103 105 105 106 --  CO2 19 22 21 23 22 24 23  --  GLUCOSE 80 102* 119* 102* 81 76 107* --  BUN 24* 25* 23 23 24* 23 27* --  CREATININE 3.14* 2.98* 2.93* 2.97* 2.55* 2.19* 1.85* --  ALB -- -- --  -- -- -- -- --  CALCIUM 9.3 9.2 9.5 9.7 9.5 9.5 9.2 --  PHOS 3.6 3.1 3.7 3.2 -- 3.4 2.7 3.9   CBC  Lab 06/17/11 1603 06/16/11 0500 06/15/11 0527 06/14/11 0914 06/13/11 0520 06/12/11 1248  WBC 5.2 2.6* 3.5* 3.8* -- --  NEUTROABS -- -- -- -- 5.0 5.5  HGB 7.6* 7.6* 7.6* 7.7* -- --  HCT 23.4* 23.2* 23.1* 23.0* -- --  MCV 92.9 93.9 93.9 93.1 -- --  PLT 227 196 166 151 -- --    Medications:       . aspirin EC  81 mg Oral Daily  . azaTHIOprine  100 mg Oral Daily  . calcitRIOL  0.25 mcg Oral Daily  . clotrimazole  10 mg Oral 5 X Daily  . darbepoetin (ARANESP) injection - NON-DIALYSIS  100 mcg Subcutaneous Q Sat-1800  . heparin  5,000 Units Subcutaneous Q8H  . methylPREDNISolone (SOLU-MEDROL) injection  40 mg Intravenous Daily  . metoCLOPramide  5 mg Oral QHS  . midodrine  10 mg Oral BID  . mulitivitamin with minerals  1 tablet Oral Daily  . pantoprazole  40 mg Oral Q1200  . piperacillin-tazobactam (ZOSYN)  IV  2.25 g Intravenous Q8H  . sodium chloride  3 mL Intravenous Q12H  . tacrolimus  2 mg Oral QHS  . tacrolimus  3 mg Oral Daily  . valACYclovir  500 mg Oral Daily     Assessment/ Plan:   1. UTI / urosepsis with renal allograft site pain: Negative CT for abscess/obstruction/calculi initially Renal function worsened for unclear reasons because otherwise clinically is doing well.  E coli appears fairly sensitive, but given that creatinine was worsening , I feel better if on IV antibiotics for her bacteremia.  More recent renal ultrasound and U/Watkins look fine.  Creatinine hopefully  plateauing so see no need at present to need to be transferred, also no dialysis need.   Stress dose steroids as well.  UOP picking up.  Will check prograf level, as if too high may be hampering improvement of renal function.   2. Hypotension: BP improved/stable on midodrine.  Question whether renal function took Watkins little hit with low BP.  Have d/c d lasix, giving IVF and stress dose steroids BP is better.  Now more volume overloaded , will stop IVF but no lasix yet 3. Anemia: Likely anemia of chronic kidney disease compounded by recent infection/hospitalizations and inflammatory resistance to erythropoietin. On ESA 4. Gastroparesis: Continue metoclopramide therapy.  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  Is stable, no intervention needed 6. Dispo- would keep all the same except d/c IVF  and follow creatinine.    Tina Watkins  06/18/2011, 11:24 AM

## 2011-06-18 NOTE — Progress Notes (Signed)
Family Medicine Teaching Service Daily Progress Note: [id number] Subjective: Had some nausea and vomiting overnight, relieved with zofran. Feels bloating in abdomen and face.  Having some right sided flank pain, stable since admission.   Objective: Vital signs in last 24 hours: Temp:  [98 F (36.7 C)-98.5 F (36.9 C)] 98 F (36.7 C) (05/16 0529) Pulse Rate:  [49-134] 50  (05/16 0529) Resp:  [18-20] 18  (05/16 0529) BP: (99-130)/(57-79) 104/57 mmHg (05/16 0529) SpO2:  [94 %-100 %] 100 % (05/16 0529) Weight:  [140 lb 1.6 oz (63.549 kg)] 140 lb 1.6 oz (63.549 kg) (05/15 2126) Weight change: 5 lb 1.6 oz (2.313 kg) Last BM Date: 06/17/11  Intake/Output from previous day: 05/15 0701 - 05/16 0700 In: 240 [P.O.:240] Out: 550 [Urine:550] Intake/Output this shift:    General appearance: alert, cooperative and anxious appearing Eyes: enucleated right eye, facial edema present Resp: crackles in lower bases, normal air movement Cardio: regular rate and rhythm, S1, S2 normal, no murmur, click, rub or gallop GI: soft, mildly distended, discomfort with palpation along epigastric region, no guarding, no rebound Extremities: extremities normal, atraumatic, no cyanosis or edema  Lab Results:  Basename 06/17/11 1603 06/16/11 0500  WBC 5.2 2.6*  HGB 7.6* 7.6*  HCT 23.4* 23.2*  PLT 227 196   BMET  Basename 06/18/11 0530 06/17/11 1603  NA 142 139  K 3.6 4.0  CL 108 106  CO2 19 22  GLUCOSE 80 102*  BUN 24* 25*  CREATININE 3.14* 2.98*  CALCIUM 9.3 9.2    Studies/Results: US Renal  06/16/2011  *RADIOLOGY REPORT*  Clinical Data: Elevated creatinine.  Rising creatinine.  RENAL/URINARY TRACT ULTRASOUND COMPLETE  Comparison:  06/12/2011.  Findings:  Right iliac fossa renal allograft is present. Long axis measurement is 10.9 cm.  Previously this measured 12.4 cm. This is probably due to technical differences as the kidney has ranged in size on prior examinations.  Central sinus echo complex  appears normal.  No hydronephrosis. Doppler wave forms were not requested or performed.  Bladder:  Normal  IMPRESSION: Uncomplicated appearance of right iliac fossa renal allograft.  No hydronephrosis.  Original Report Authenticated By: Dereck Ligas, M.D.    Medications:  Scheduled:   . aspirin EC  81 mg Oral Daily  . azaTHIOprine  100 mg Oral Daily  . calcitRIOL  0.25 mcg Oral Daily  . clotrimazole  10 mg Oral 5 X Daily  . darbepoetin (ARANESP) injection - NON-DIALYSIS  100 mcg Subcutaneous Q Sat-1800  . heparin  5,000 Units Subcutaneous Q8H  . methylPREDNISolone (SOLU-MEDROL) injection  40 mg Intravenous Daily  . metoCLOPramide  5 mg Oral QHS  . midodrine  10 mg Oral BID  . mulitivitamin with minerals  1 tablet Oral Daily  . pantoprazole  40 mg Oral Q1200  . piperacillin-tazobactam (ZOSYN)  IV  2.25 g Intravenous Q8H  . sodium chloride  500 mL Intravenous Once  . sodium chloride  3 mL Intravenous Q12H  . tacrolimus  2 mg Oral QHS  . tacrolimus  3 mg Oral Daily  . valACYclovir  500 mg Oral Daily   Continuous:   . sodium chloride 100 mL/hr at 06/18/11 0121    Assessment/Plan: Hospital Day #34  46 y.o. year old female with ESRD s/p 3 Renal Transplants presenting with RLQ pain over transplant site and reported fever at home. Associated nausea/vomiting.   # UTI and bacteremia: E coli with bacteremia: initially on rocephin, zosyn, keflex and put back on zosyn  per renal's recommendations due to worsening renal function which could have been secondary to infection. Has been on immunosuppressive therapy for transplant and therefore has not had elevated WBC. CT abdomen for 5/10 did not show any abscess, hydronephrosis or other chronic change.  - currently afebrile - continue IV zosyn (start: 5/14)  # ESRD s/p renal allograft: creatinine acutely increased since admission:  - currently at 2.98 - opssibly from ATN from hypotension episode causing hypoperfusion to kidneys Lab  06/17/11  0510  06/16/11 1613  06/16/11 0500  06/15/11 0527  06/14/11 0914  06/13/11 0520  06/12/11 2128  06/12/11 1248   CREATININE  2.93*  2.97*  2.55*  2.19*  1.85*  1.91*  1.80*  1.68*   - receiving fluids at 100 cc/hr which was decreased given evidence of fluid retention - on solumedrol 40mg  daily - making some urine: will follow up on urine output - continue immunosuppressant therapy Azathioprine, Tacrolimus  - will follow on renal's recommendations  # Abdominal and right flank pain: Unclear etiology: CT scan did not show evidence of abscess 05/10. Will follow clinically. Will consider increasing pain medicine to dilaudid.   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

## 2011-06-18 NOTE — Progress Notes (Signed)
FMTS Attending Note  Patient seen and examined by me, discussed with Dr Otis Dials and I agree with her management/plan as per her note.  Patient appears more uncomfortable in R flank today, made somewhat better with Dilaudid.  Serum creatinine rising slowly.  Still suspected related to hypoperfusion-induced ATN.  Continues on Zosyn for E coli UTI. Dalbert Mayotte, MD

## 2011-06-18 NOTE — Progress Notes (Signed)
Patient is getting out of bed frequently to attempt to empty bladder.  Rec'd order for Foley Catheter.  She does not want Foley Catheter at this time.  Bladder Scan last night showed less than 49 ml in bladder.  Will continue to monitor patient.   Linus Galas  06/18/2011

## 2011-06-19 LAB — CBC
HCT: 22.9 % — ABNORMAL LOW (ref 36.0–46.0)
Hemoglobin: 7.4 g/dL — ABNORMAL LOW (ref 12.0–15.0)
MCH: 30.6 pg (ref 26.0–34.0)
MCHC: 32.3 g/dL (ref 30.0–36.0)
MCV: 94.6 fL (ref 78.0–100.0)
Platelets: 252 10*3/uL (ref 150–400)
RBC: 2.42 MIL/uL — ABNORMAL LOW (ref 3.87–5.11)
RDW: 21.5 % — ABNORMAL HIGH (ref 11.5–15.5)
WBC: 3.9 10*3/uL — ABNORMAL LOW (ref 4.0–10.5)

## 2011-06-19 LAB — CULTURE, BLOOD (ROUTINE X 2)
Culture  Setup Time: 201305110301
Culture: NO GROWTH

## 2011-06-19 LAB — RENAL FUNCTION PANEL
Albumin: 3.2 g/dL — ABNORMAL LOW (ref 3.5–5.2)
BUN: 23 mg/dL (ref 6–23)
CO2: 22 mEq/L (ref 19–32)
Calcium: 9.6 mg/dL (ref 8.4–10.5)
Chloride: 107 mEq/L (ref 96–112)
Creatinine, Ser: 3.16 mg/dL — ABNORMAL HIGH (ref 0.50–1.10)
GFR calc Af Amer: 20 mL/min — ABNORMAL LOW (ref >90)
GFR calc non Af Amer: 17 mL/min — ABNORMAL LOW (ref >90)
Glucose, Bld: 80 mg/dL (ref 70–99)
Phosphorus: 3.8 mg/dL (ref 2.3–4.6)
Potassium: 3.4 mEq/L — ABNORMAL LOW (ref 3.5–5.1)
Sodium: 141 mEq/L (ref 135–145)

## 2011-06-19 LAB — DIFFERENTIAL
Basophils Absolute: 0 10*3/uL (ref 0.0–0.1)
Basophils Relative: 1 % (ref 0–1)
Eosinophils Absolute: 0.1 10*3/uL (ref 0.0–0.7)
Eosinophils Relative: 2 % (ref 0–5)
Lymphocytes Relative: 24 % (ref 12–46)
Lymphs Abs: 0.9 10*3/uL (ref 0.7–4.0)
Monocytes Absolute: 0.2 10*3/uL (ref 0.1–1.0)
Monocytes Relative: 5 % (ref 3–12)
Neutro Abs: 2.7 10*3/uL (ref 1.7–7.7)
Neutrophils Relative %: 68 % (ref 43–77)

## 2011-06-19 LAB — TACROLIMUS LEVEL: Tacrolimus (FK506) - LabCorp: 22.8 ng/mL

## 2011-06-19 MED ORDER — DIPHENHYDRAMINE HCL 25 MG PO CAPS
25.0000 mg | ORAL_CAPSULE | Freq: Four times a day (QID) | ORAL | Status: DC | PRN
  Administered 2011-06-19: 25 mg via ORAL
  Filled 2011-06-19: qty 1

## 2011-06-19 MED ORDER — DIPHENHYDRAMINE HCL 50 MG/ML IJ SOLN: 25.0000 mg | Freq: Three times a day (TID) | INTRAMUSCULAR | Status: DC | PRN

## 2011-06-19 MED ORDER — POTASSIUM CHLORIDE CRYS ER 20 MEQ PO TBCR
20.0000 meq | EXTENDED_RELEASE_TABLET | Freq: Once | ORAL | Status: AC
  Administered 2011-06-19: 20 meq via ORAL
  Filled 2011-06-19: qty 1

## 2011-06-19 NOTE — Progress Notes (Signed)
Just heard that prograf level was over 22,  Probably is hampering recovery, will hold dose tonight and in AM, then restart tomorrow evening at a decreased dose.    Ladora Osterberg A

## 2011-06-19 NOTE — Progress Notes (Signed)
FMTS Attending Note Case discussed with resident team, I agree with Dr. Marella Chimes assessment and plan as documented in her note.  Dalbert Mayotte, MD

## 2011-06-19 NOTE — Progress Notes (Signed)
Family Medicine Teaching Service Daily Progress Note: [id number] Subjective: Feeling better today. Had a lot of anxiety yesterday which required xanax. Anxiety from bills as well as frustration with medical condition. Pain in right lower back: spasm like in quality, worst at night. Required dilaudid x1.  CUrrently pain is much improved. Voided over night. Feels like she is urinating more easily.   Objective: Vital signs in last 24 hours: Temp:  [98.1 F (36.7 C)-98.8 F (37.1 C)] 98.1 F (36.7 C) (05/17 0515) Pulse Rate:  [54-111] 56  (05/17 0515) Resp:  [17] 17  (05/17 0515) BP: (97-117)/(56-69) 97/59 mmHg (05/17 0515) SpO2:  [98 %-100 %] 100 % (05/17 0515) Weight:  [145 lb 4.8 oz (65.908 kg)] 145 lb 4.8 oz (65.908 kg) (05/16 2108) Weight change: 5 lb 3.2 oz (2.359 kg) Last BM Date: 06/18/11  Intake/Output from previous day: 05/16 0701 - 05/17 0700 In: 480 [P.O.:480] Out: 400 [Urine:400]: >800cc in hat not yet recorded from overnight.  Intake/Output this shift:    General appearance: alert, cooperative and in no acute distress, sitting on edge of bed.  Eyes: enucleated right eye, facial edema present Resp: CTA b/l Cardio: regular rate and rhythm, S1, S2 normal, no murmur, click, rub or gallop GI: soft, mildly distended, discomfort with palpation along epigastric region improved from yesterday, no guarding, no rebound Extremities: extremities normal, atraumatic, +1 edema in lower extremities.   Lab Results:  Basename 06/19/11 0525 06/17/11 1603  WBC 3.9* 5.2  HGB 7.4* 7.6*  HCT 22.9* 23.4*  PLT 252 227   BMET  Basename 06/18/11 0530 06/17/11 1603  NA 142 139  K 3.6 4.0  CL 108 106  CO2 19 22  GLUCOSE 80 102*  BUN 24* 25*  CREATININE 3.14* 2.98*  CALCIUM 9.3 9.2    Studies/Results: No results found.  Medications:  Scheduled:    . aspirin EC  81 mg Oral Daily  . azaTHIOprine  100 mg Oral Daily  . calcitRIOL  0.25 mcg Oral Daily  . clotrimazole  10 mg Oral 5  X Daily  . darbepoetin (ARANESP) injection - NON-DIALYSIS  100 mcg Subcutaneous Q Sat-1800  . heparin  5,000 Units Subcutaneous Q8H  . methylPREDNISolone (SOLU-MEDROL) injection  40 mg Intravenous Daily  . metoCLOPramide  5 mg Oral QHS  . midodrine  10 mg Oral BID  . mulitivitamin with minerals  1 tablet Oral Daily  . pantoprazole  40 mg Oral Q1200  . piperacillin-tazobactam (ZOSYN)  IV  2.25 g Intravenous Q8H  . sodium chloride  3 mL Intravenous Q12H  . tacrolimus  2 mg Oral QHS  . tacrolimus  3 mg Oral Daily  . valACYclovir  500 mg Oral Daily   Continuous:    . DISCONTD: sodium chloride 100 mL/hr at 06/18/11 0121    Assessment/Plan: Hospital Day #101  46 y.o. year old female with ESRD s/p 3 Renal Transplants presenting with RLQ pain over transplant site and reported fever at home. Associated nausea/vomiting.   # ESRD s/p renal allograft: creatinine acutely increased since admission:   - possibly from ATN from hypotension episode causing hypoperfusion to kidneys - Fluid status: fluids were discontinued yesterday due to fluid overload. Patient currently making more urine than previous. Continue to monitor urine output.  - creatinine is stable from yesterday at 3.19 compared to 3.14.  - continue solumedrol 40mg  daily - prograph level is pending at this time.  - continue immunosuppressant therapy Azathioprine, Tacrolimus  - will follow on  renal's recommendations  # UTI and bacteremia: E coli with bacteremia: initially on rocephin, zosyn, keflex and put back on zosyn per renal's recommendations due to worsening renal function which could have been secondary to infection. Has been on immunosuppressive therapy for transplant and therefore has not had elevated WBC. CT abdomen for 5/10 did not show any abscess, hydronephrosis or other chronic change.  - currently afebrile - continue IV zosyn (start: 5/14). Patient on day 7 of antibiotics total. For bacteremia could get 14 days. E-coli  sensitive to keflex.   # Hypotension:  - Stable on midodrine 10 bid. - Continue to monitor  # Abdominal and right flank pain: Most likely secondary to GERD. 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

## 2011-06-19 NOTE — Progress Notes (Signed)
ANTIBIOTIC CONSULT NOTE - Follow up  Pharmacy Consult for Zosyn Indication: UTI / urosepsis  Allergies  Allergen Reactions  . Levofloxacin Rash    Patient Measurements: Height: 4\' 11"  (149.9 cm) Weight: 145 lb 4.8 oz (65.908 kg) IBW/kg (Calculated) : 43.2    Vital Signs: Temp: 98.7 F (37.1 C) (05/17 0905) Temp src: Oral (05/17 0905) BP: 107/62 mmHg (05/17 0905) Pulse Rate: 60  (05/17 0905) Intake/Output from previous day: 05/16 0701 - 05/17 0700 In: 480 [P.O.:480] Out: 400 [Urine:400] Intake/Output from this shift: Total I/O In: 0  Out: 700 [Urine:700]  Labs:  [facility] 06/19/11 0810 06/19/11 0525 06/18/11 0530 06/17/11 1603  WBC -- 3.9* -- 5.2  HGB -- 7.4* -- 7.6*  PLT -- 252 -- 227  LABCREA -- -- -- --  CREATININE 3.16* -- 3.14* 2.98*   Estimated Creatinine Clearance: 19 ml/min (by C-G formula based on Cr of 3.16). No results found for this basename: VANCOTROUGH:2,VANCOPEAK:2,VANCORANDOM:2,GENTTROUGH:2,GENTPEAK:2,GENTRANDOM:2,TOBRATROUGH:2,TOBRAPEAK:2,TOBRARND:2,AMIKACINPEAK:2,AMIKACINTROU:2,AMIKACIN:2, in the last 72 hours   Medical History: Past Medical History  Diagnosis Date  . Gastroparesis   . Hx of kidney transplant   . Gout   . Hypotension   . Herpes genitalia   . Stroke      left basal ganglia lacunar infarct; Right frontal lobe lacunar infarct.    Assessment: 61 YOF with ESRD s/p kidney transplant, now with E.coli UTI and bacteremia. Cultures only resistant to Ampicillin and nitrofurantoin. Day #3 zosyn, total abx days#7. Renal fxn stable, better output  Antibiotics: Rocephin -  5/10 >> 5/12 Zosyn - 5/11 >> 5/12 Keflex - 5/13 >> 5/14 Zosyn - 5/14 >>  Plan:  - Continue Zosyn 2.25g IV Q 8hrs  - f/u clinical course and continue monitor renal function Consider streamline to rocephin vs abx therapy completed  Davonna Belling, PharmD, BCPS Pager [phone] 06/19/2011,9:52 AM

## 2011-06-19 NOTE — Progress Notes (Signed)
Pt. Wants to take shower. Told she needs order to be off tele for shower. Will notify MD and ask for order. Pt still stated that she will take off anyways  And take shower.

## 2011-06-19 NOTE — Progress Notes (Signed)
CRITICAL VALUE ALERT  Critical value received:  Tacrolimus 22.8  Date of notification:  06/19/2011  Time of notification:  1250  Critical value read back:yes  Nurse who received alert:  Adelene Idler, RN, BSN, [facility], MSN  MD notified (1st page):   Dr. Moshe Cipro  Time of first page:  1251  MD notified (2nd page):  Time of second page:  Responding MD:  Dr. Catha Gosselin Unit ( Time MD responded:  1255

## 2011-06-19 NOTE — Progress Notes (Signed)
Utilization review completed. Lizabeth Leyden RN, CCM

## 2011-06-19 NOTE — Progress Notes (Signed)
Patient ID: Tina Watkins, female   DOB: 03/01/1965, 46 y.o.   MRN: [id number]   [facility] Progress Note    Subjective:   Feels better today. UOP is picking up.    Objective:   BP 107/62  Pulse 60  Temp(Src) 98.7 F (37.1 C) (Oral)  Resp 16  Ht 4\' 11"  (1.499 m)  Wt 65.908 kg (145 lb 4.8 oz)  BMI 29.35 kg/m2  SpO2 99%  Intake/Output Summary (Last 24 hours) at 06/19/11 0927 Last data filed at 06/19/11 0906  Gross per 24 hour  Intake    240 ml  Output    900 ml  Net   -660 ml   Weight change: 2.359 kg (5 lb 3.2 oz)  Physical Exam: Gen: more somnolent today-  [id number] RRR, normal S1 and S2  Resp:CTA bilaterally, no rales/rhonchi [id number], Obese, mildly tender over lower quadrants but better.  Now with epigastric pain Ext- now has edema  Imaging: No results found.  Labs: BMET  Lab 06/19/11 0810 06/18/11 0530 06/17/11 1603 06/17/11 0510 06/16/11 1613 06/16/11 0500 06/15/11 0527 06/14/11 0914  NA 141 142 139 141 139 140 140 --  K 3.4* 3.6 4.0 4.6 3.9 3.9 3.9 --  CL 107 108 106 106 103 105 105 --  CO2 22 19 22 21 23 22 24  --  GLUCOSE 80 80 102* 119* 102* 81 76 --  BUN 23 24* 25* 23 23 24* 23 --  CREATININE 3.16* 3.14* 2.98* 2.93* 2.97* 2.55* 2.19* --  ALB -- -- -- -- -- -- -- --  CALCIUM 9.6 9.3 9.2 9.5 9.7 9.5 9.5 --  PHOS 3.8 3.6 3.1 3.7 3.2 -- 3.4 2.7   CBC  Lab 06/19/11 0525 06/17/11 1603 06/16/11 0500 06/15/11 0527 06/13/11 0520 06/12/11 1248  WBC 3.9* 5.2 2.6* 3.5* -- --  NEUTROABS 2.7 -- -- -- 5.0 5.5  HGB 7.4* 7.6* 7.6* 7.6* -- --  HCT 22.9* 23.4* 23.2* 23.1* -- --  MCV 94.6 92.9 93.9 93.9 -- --  PLT 252 227 196 166 -- --    Medications:       . aspirin EC  81 mg Oral Daily  . azaTHIOprine  100 mg Oral Daily  . calcitRIOL  0.25 mcg Oral Daily  . clotrimazole  10 mg Oral 5 X Daily  . darbepoetin (ARANESP) injection - NON-DIALYSIS  100 mcg Subcutaneous Q Sat-1800  . heparin  5,000 Units Subcutaneous Q8H  .  methylPREDNISolone (SOLU-MEDROL) injection  40 mg Intravenous Daily  . metoCLOPramide  5 mg Oral QHS  . midodrine  10 mg Oral BID  . mulitivitamin with minerals  1 tablet Oral Daily  . pantoprazole  40 mg Oral Q1200  . piperacillin-tazobactam (ZOSYN)  IV  2.25 g Intravenous Q8H  . sodium chloride  3 mL Intravenous Q12H  . tacrolimus  2 mg Oral QHS  . tacrolimus  3 mg Oral Daily  . valACYclovir  500 mg Oral Daily     Assessment/ Plan:   1. UTI / urosepsis with renal allograft site pain: Negative CT for abscess/obstruction/calculi initially Renal function worsened for unclear reasons because otherwise clinically was doing well.  E coli appears fairly sensitive, but given that creatinine was worsening , I feel better if on IV antibiotics for her bacteremia.  More recent renal ultrasound and U/A look fine.  Creatinine hopefully  plateauing so see no need at present to need to be transferred, also no dialysis need.   Stress dose  steroids as well.  UOP picking up.  Will check prograf level, as if too high may be hampering improvement of renal function.   2. Hypotension: BP improved/stable on midodrine.  Question whether renal function took a little hit with low BP.  Have d/c d lasix, giving IVF and stress dose steroids BP is better. Now more volume overloaded , will stop IVF but no lasix yet 3. Anemia: Likely anemia of chronic kidney disease compounded by recent infection/hospitalizations and inflammatory resistance to erythropoietin. On ESA 4. Gastroparesis: Continue metoclopramide therapy.  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  Is stable, no intervention needed 6. Dispo- would keep all the same and follow creatinine.    Tina Watkins A  06/19/2011, 9:27 AM

## 2011-06-20 LAB — RENAL FUNCTION PANEL
Albumin: 3.2 g/dL — ABNORMAL LOW (ref 3.5–5.2)
BUN: 23 mg/dL (ref 6–23)
CO2: 21 mEq/L (ref 19–32)
Calcium: 9.8 mg/dL (ref 8.4–10.5)
Chloride: 109 mEq/L (ref 96–112)
Creatinine, Ser: 2.91 mg/dL — ABNORMAL HIGH (ref 0.50–1.10)
GFR calc Af Amer: 22 mL/min — ABNORMAL LOW (ref >90)
GFR calc non Af Amer: 19 mL/min — ABNORMAL LOW (ref >90)
Glucose, Bld: 84 mg/dL (ref 70–99)
Phosphorus: 3.6 mg/dL (ref 2.3–4.6)
Potassium: 3.6 mEq/L (ref 3.5–5.1)
Sodium: 141 mEq/L (ref 135–145)

## 2011-06-20 LAB — CBC
HCT: 22 % — ABNORMAL LOW (ref 36.0–46.0)
Hemoglobin: 7.2 g/dL — ABNORMAL LOW (ref 12.0–15.0)
MCH: 31.2 pg (ref 26.0–34.0)
MCHC: 32.7 g/dL (ref 30.0–36.0)
MCV: 95.2 fL (ref 78.0–100.0)
Platelets: 273 10*3/uL (ref 150–400)
RBC: 2.31 MIL/uL — ABNORMAL LOW (ref 3.87–5.11)
RDW: 21.6 % — ABNORMAL HIGH (ref 11.5–15.5)
WBC: 3.6 10*3/uL — ABNORMAL LOW (ref 4.0–10.5)

## 2011-06-20 MED ORDER — TACROLIMUS 1 MG PO CAPS
1.0000 mg | ORAL_CAPSULE | Freq: Two times a day (BID) | ORAL | Status: DC
  Administered 2011-06-20 – 2011-06-22 (×4): 1 mg via ORAL
  Filled 2011-06-20: qty 1

## 2011-06-20 MED ORDER — PREDNISONE 20 MG PO TABS
20.0000 mg | ORAL_TABLET | Freq: Every day | ORAL | Status: DC
  Administered 2011-06-21 – 2011-06-22 (×2): 20 mg via ORAL
  Filled 2011-06-20 (×3): qty 1

## 2011-06-20 NOTE — Progress Notes (Signed)
FMTS Attending Note  Patient seen and examined by me, she appears in quite good spirits this morning.  Reports that her flank pain is much improved.  Making good urine.   Exam: She standing in room, transfers easily in/out of bed.  No appreciable abdominal tenderness   Cr 2.91 today (down from 3.16 yesterday).  H/H essentially unchanged.   Assess/Plan: Patient with history renal transplant, admitted with UTI/ E coli bacteremia (from 1/2 cultures drawn 5/10), and acute worsening renal function.  Tacrolimus level was high, adjusted by renal service.  Continues on Day #8 of abx treatment for coverage of E coli; has remained afebrile and clinically appears quite well.  Has continued on iv abx for her E coli bacteremia, would appreciate renal's thoughts on our endpoint and appropriate oral coverage (appears to be very sensitive on antibiogram) in this complicated renal transplant patient.   Will continue to watch renal function.  Other recs per renal service.  Dalbert Mayotte, MD

## 2011-06-20 NOTE — Progress Notes (Signed)
Family Medicine Teaching Service Daily Progress Note: [id number] Subjective: Patient states that she had day yesterday where she felt very tired and didn't have the energy to get out of bed. She is feeling better now. Pain in right flank is better. Has been making more urine. No nausea, no vomiting, no abdominal pain.  Objective: Vital signs in last 24 hours: Temp:  [98.3 F (36.8 C)-99 F (37.2 C)] 98.6 F (37 C) (05/18 0946) Pulse Rate:  [53-103] 103  (05/18 0946) Resp:  [15-19] 19  (05/18 0946) BP: (96-130)/(52-71) 103/55 mmHg (05/18 0946) SpO2:  [93 %-100 %] 93 % (05/18 0946) Weight:  [141 lb 15.6 oz (64.4 kg)] 141 lb 15.6 oz (64.4 kg) (05/17 2154) Weight change: -3 lb 5.2 oz (-1.508 kg) Last BM Date: 06/19/11  Intake/Output from previous day: 05/16 0701 - 05/17 0700 In: 480 [P.O.:480] Out: 400 [Urine:400]: >800cc in hat not yet recorded from overnight.  Intake/Output this shift: Total I/O In: 240 [P.O.:240] Out: -   General appearance: alert, cooperative and in no acute distress, sitting on edge of bed, moving around in the room without oxygen.  Eyes: enucleated right eye, facial edema present Resp: CTA b/l, normal work of breathing Cardio: regular rate and rhythm, S1, S2 normal, no murmur, click, rub or gallop GI: soft, mildly distended, non tender, no guarding, no rebound Extremities: extremities normal, atraumatic, no lower extremity edema.   Lab Results:  Basename 06/20/11 0445 06/19/11 0525  WBC 3.6* 3.9*  HGB 7.2* 7.4*  HCT 22.0* 22.9*  PLT 273 252   BMET  Basename 06/20/11 0445 06/19/11 0810  NA 141 141  K 3.6 3.4*  CL 109 107  CO2 21 22  GLUCOSE 84 80  BUN 23 23  CREATININE 2.91* 3.16*  CALCIUM 9.8 9.6    Studies/Results: No results found.  Medications:  Scheduled:    . aspirin EC  81 mg Oral Daily  . azaTHIOprine  100 mg Oral Daily  . calcitRIOL  0.25 mcg Oral Daily  . clotrimazole  10 mg Oral 5 X Daily  . darbepoetin (ARANESP) injection -  NON-DIALYSIS  100 mcg Subcutaneous Q Sat-1800  . heparin  5,000 Units Subcutaneous Q8H  . methylPREDNISolone (SOLU-MEDROL) injection  40 mg Intravenous Daily  . metoCLOPramide  5 mg Oral QHS  . midodrine  10 mg Oral BID  . mulitivitamin with minerals  1 tablet Oral Daily  . pantoprazole  40 mg Oral Q1200  . piperacillin-tazobactam (ZOSYN)  IV  2.25 g Intravenous Q8H  . potassium chloride  20 mEq Oral Once  . sodium chloride  3 mL Intravenous Q12H  . valACYclovir  500 mg Oral Daily  . DISCONTD: tacrolimus  2 mg Oral QHS  . DISCONTD: tacrolimus  3 mg Oral Daily   Continuous:    Assessment/Plan: 46 y.o. year old female with ESRD s/p 3 Renal Transplants presenting with RLQ pain over transplant site and reported fever at home. Associated nausea/vomiting.   # ESRD s/p renal allograft: creatinine acutely increased since admission: but now stable and improving since yesterday.    - possibly from ATN from hypotension episode causing hypoperfusion to kidneys - Fluid status: no IV fluids. Urine output has greatly improved.  - creatinine improving at 2.91 today compared to 3.16 yesterday  - continue solumedrol 40mg  daily - prograph level was elevated and Dr. Moshe Cipro held dose yesterday evening and this morning, as this could be affecting kidney function.   # UTI and bacteremia: E coli  with bacteremia: initially on rocephin, zosyn, keflex and put back on zosyn per renal's recommendations due to worsening renal function which could have been secondary to infection. Has been on immunosuppressive therapy for transplant and therefore has not had elevated WBC. CT abdomen for 5/10 did not show any abscess, hydronephrosis or other chronic change.  - currently afebrile with stable WBC - patient on zosyn day 5 (start: 5/14). Patient on day 8 of antibiotics total. Will follow renal's recommendations on preference to switching her to oral antibiotics like keflex since ecoli was sensitive to it.  #  Hypotension:  - Stable on midodrine 10 bid. - Continue to monitor  # Abdominal and right flank pain: Most likely secondary to GERD. 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

## 2011-06-20 NOTE — Progress Notes (Signed)
Patient ID: Tina Watkins, female   DOB: Nov 23, 1965, 46 y.o.   MRN: [id number]   [facility] Progress Note    Subjective:   Feels better today. UOP is picking up, creatinine now trending down. !   Objective:   BP 103/55  Pulse 103  Temp(Src) 98.6 F (37 C) (Oral)  Resp 19  Ht 4\' 11"  (1.499 m)  Wt 64.4 kg (141 lb 15.6 oz)  BMI 28.68 kg/m2  SpO2 93%  Intake/Output Summary (Last 24 hours) at 06/20/11 1131 Last data filed at 06/20/11 0900  Gross per 24 hour  Intake   1070 ml  Output   1050 ml  Net     20 ml   Weight change: -1.508 kg (-3 lb 5.2 oz)  Physical Exam: Gen: feels better [id number] RRR, normal S1 and S2  Resp:CTA bilaterally, no rales/rhonchi [id number], Obese, mildly tender over lower quadrants but better.  Now with epigastric pain Ext- edema improved  Imaging: No results found.  Labs: BMET  Lab 06/20/11 0445 06/19/11 0810 06/18/11 0530 06/17/11 1603 06/17/11 0510 06/16/11 1613 06/16/11 0500 06/15/11 0527  NA 141 141 142 139 141 139 140 --  K 3.6 3.4* 3.6 4.0 4.6 3.9 3.9 --  CL 109 107 108 106 106 103 105 --  CO2 21 22 19 22 21 23 22  --  GLUCOSE 84 80 80 102* 119* 102* 81 --  BUN 23 23 24* 25* 23 23 24* --  CREATININE 2.91* 3.16* 3.14* 2.98* 2.93* 2.97* 2.55* --  ALB -- -- -- -- -- -- -- --  CALCIUM 9.8 9.6 9.3 9.2 9.5 9.7 9.5 --  PHOS 3.6 3.8 3.6 3.1 3.7 3.2 -- 3.4   CBC  Lab 06/20/11 0445 06/19/11 0525 06/17/11 1603 06/16/11 0500  WBC 3.6* 3.9* 5.2 2.6*  NEUTROABS -- 2.7 -- --  HGB 7.2* 7.4* 7.6* 7.6*  HCT 22.0* 22.9* 23.4* 23.2*  MCV 95.2 94.6 92.9 93.9  PLT 273 252 227 196    Medications:       . aspirin EC  81 mg Oral Daily  . azaTHIOprine  100 mg Oral Daily  . calcitRIOL  0.25 mcg Oral Daily  . clotrimazole  10 mg Oral 5 X Daily  . darbepoetin (ARANESP) injection - NON-DIALYSIS  100 mcg Subcutaneous Q Sat-1800  . heparin  5,000 Units Subcutaneous Q8H  . methylPREDNISolone (SOLU-MEDROL) injection  40 mg Intravenous  Daily  . metoCLOPramide  5 mg Oral QHS  . midodrine  10 mg Oral BID  . mulitivitamin with minerals  1 tablet Oral Daily  . pantoprazole  40 mg Oral Q1200  . piperacillin-tazobactam (ZOSYN)  IV  2.25 g Intravenous Q8H  . potassium chloride  20 mEq Oral Once  . sodium chloride  3 mL Intravenous Q12H  . valACYclovir  500 mg Oral Daily  . DISCONTD: tacrolimus  2 mg Oral QHS  . DISCONTD: tacrolimus  3 mg Oral Daily     Assessment/ Plan:   1. UTI / urosepsis with renal allograft site pain: Negative CT for abscess/obstruction/calculi initially Renal function worsened for unclear reasons because otherwise clinically was doing well. Now know that her prograf level was high.  E coli appears fairly sensitive, but given that creatinine was worsening , I feel better if on IV antibiotics for her bacteremia until discharge.   More recent renal ultrasound and U/A look fine.  Creatinine now improved no need at present to need to be transferred, also no dialysis need.  Stress dose steroids-  will wean back to PO.    2. Hypotension: BP improved/stable on midodrine.  Question whether renal function took a little hit with low BP.  Have d/c d lasix, gave IVF and stress dose steroids BP is better.Now no IVF, will wean steroids.  3. Anemia: Likely anemia of chronic kidney disease compounded by recent infection/hospitalizations and inflammatory resistance to erythropoietin. On ESA 4. Gastroparesis: Continue metoclopramide therapy.  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  Is stable, no intervention needed 6. Dispo-Will wean steroids and decrease prograf and recheck level in AM   Liyana Suniga A  06/20/2011, 11:31 AM

## 2011-06-21 LAB — RENAL FUNCTION PANEL
Albumin: 3.4 g/dL — ABNORMAL LOW (ref 3.5–5.2)
BUN: 17 mg/dL (ref 6–23)
CO2: 22 mEq/L (ref 19–32)
Calcium: 9.7 mg/dL (ref 8.4–10.5)
Chloride: 108 mEq/L (ref 96–112)
Creatinine, Ser: 2.27 mg/dL — ABNORMAL HIGH (ref 0.50–1.10)
GFR calc Af Amer: 29 mL/min — ABNORMAL LOW (ref >90)
GFR calc non Af Amer: 25 mL/min — ABNORMAL LOW (ref >90)
Glucose, Bld: 82 mg/dL (ref 70–99)
Phosphorus: 3.8 mg/dL (ref 2.3–4.6)
Potassium: 3.9 mEq/L (ref 3.5–5.1)
Sodium: 142 mEq/L (ref 135–145)

## 2011-06-21 MED ORDER — DEXTROSE 5 % IV SOLN
1.0000 g | INTRAVENOUS | Status: DC
  Administered 2011-06-21 – 2011-06-22 (×2): 1 g via INTRAVENOUS
  Filled 2011-06-21 (×2): qty 10

## 2011-06-21 NOTE — Progress Notes (Signed)
Patient ID: Tina Watkins, female   DOB: 1965-06-23, 46 y.o.   MRN: [id number]   [facility] Progress Note    Subjective:   Feels better today. UOP is picking up, creatinine pending this AM   Objective:   BP 100/53  Pulse 54  Temp(Src) 97.8 F (36.6 C) (Oral)  Resp 18  Ht 4\' 11"  (1.499 m)  Wt 63.821 kg (140 lb 11.2 oz)  BMI 28.42 kg/m2  SpO2 100%  Intake/Output Summary (Last 24 hours) at 06/21/11 0954 Last data filed at 06/21/11 0641  Gross per 24 hour  Intake    340 ml  Output   1100 ml  Net   -760 ml   Weight change: -0.579 kg (-1 lb 4.4 oz)  Physical Exam: Gen: feels better [id number] RRR, normal S1 and S2  Resp:CTA bilaterally, no rales/rhonchi [id number], Obese, mildly tender over lower quadrants but better.  Now with epigastric pain Ext- edema improved  Imaging: No results found.  Labs: BMET  Lab 06/20/11 0445 06/19/11 0810 06/18/11 0530 06/17/11 1603 06/17/11 0510 06/16/11 1613 06/16/11 0500 06/15/11 0527  NA 141 141 142 139 141 139 140 --  K 3.6 3.4* 3.6 4.0 4.6 3.9 3.9 --  CL 109 107 108 106 106 103 105 --  CO2 21 22 19 22 21 23 22  --  GLUCOSE 84 80 80 102* 119* 102* 81 --  BUN 23 23 24* 25* 23 23 24* --  CREATININE 2.91* 3.16* 3.14* 2.98* 2.93* 2.97* 2.55* --  ALB -- -- -- -- -- -- -- --  CALCIUM 9.8 9.6 9.3 9.2 9.5 9.7 9.5 --  PHOS 3.6 3.8 3.6 3.1 3.7 3.2 -- 3.4   CBC  Lab 06/20/11 0445 06/19/11 0525 06/17/11 1603 06/16/11 0500  WBC 3.6* 3.9* 5.2 2.6*  NEUTROABS -- 2.7 -- --  HGB 7.2* 7.4* 7.6* 7.6*  HCT 22.0* 22.9* 23.4* 23.2*  MCV 95.2 94.6 92.9 93.9  PLT 273 252 227 196    Medications:       . aspirin EC  81 mg Oral Daily  . azaTHIOprine  100 mg Oral Daily  . calcitRIOL  0.25 mcg Oral Daily  . clotrimazole  10 mg Oral 5 X Daily  . darbepoetin (ARANESP) injection - NON-DIALYSIS  100 mcg Subcutaneous Q Sat-1800  . heparin  5,000 Units Subcutaneous Q8H  . metoCLOPramide  5 mg Oral QHS  . midodrine  10 mg Oral BID    . mulitivitamin with minerals  1 tablet Oral Daily  . pantoprazole  40 mg Oral Q1200  . piperacillin-tazobactam (ZOSYN)  IV  2.25 g Intravenous Q8H  . predniSONE  20 mg Oral Q breakfast  . sodium chloride  3 mL Intravenous Q12H  . tacrolimus  1 mg Oral BID  . valACYclovir  500 mg Oral Daily  . DISCONTD: methylPREDNISolone (SOLU-MEDROL) injection  40 mg Intravenous Daily     Assessment/ Plan:   1. UTI / urosepsis with renal allograft site pain: Negative CT for abscess/obstruction/calculi initially Renal function worsened for unclear reasons because otherwise clinically was doing well. Now know that her prograf level was high.  E coli appears fairly sensitive, but given that creatinine was worsening , I feel better if on IV antibiotics for her bacteremia until discharge.   More recent renal ultrasound and U/A look fine.  Creatinine now improved no need at present to need to be transferred, also no dialysis need.   Stress dose solumedrol weaned back to PO today.  2. Hypotension: BP improved/stable on midodrine.  Question whether renal function took a little hit with low BP.  Have d/c d lasix, gave IVF and stress dose steroids BP is better.Now no IVF, have  weaned steroids.  3. Anemia: Likely anemia of chronic kidney disease compounded by recent infection/hospitalizations and inflammatory resistance to erythropoietin. On ESA 4. Gastroparesis: Continue metoclopramide therapy.  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  Is stable, no intervention needed 6. Dispo-Prograf level from this AM will be pending as well.  Took her from 3/2 regimen to 1/1 after holding 2 doses.  Hopefully creatinine will continue to improve from here   [place] A  06/21/2011, 9:54 AM

## 2011-06-21 NOTE — Progress Notes (Signed)
Daily Progress Note Tina Watkins. Tina Watkins, M.D., M.B.A  Family Medicine PGY-1 Pager [phone]  Subjective:  Pt in bed reading the paper this AM, states that she feels much better, just worried about her creatinine; denies abdominal pain   Objective: Vital signs in last 24 hours: Temp:  [97.8 F (36.6 C)-98.6 F (37 C)] 98.6 F (37 C) (05/19 1100) Pulse Rate:  [54-64] 59  (05/19 1100) Resp:  [18-19] 19  (05/19 1100) BP: (89-119)/(48-99) 119/99 mmHg (05/19 1100) SpO2:  [98 %-100 %] 100 % (05/19 1100) Weight:  [140 lb 11.2 oz (63.821 kg)] 140 lb 11.2 oz (63.821 kg) (05/18 2058) Weight change: -1 lb 4.4 oz (-0.579 kg) Last BM Date: 06/19/11  Intake/Output from previous day:  Intake/Output Summary (Last 24 hours) at 06/21/11 1128 Last data filed at 06/21/11 0800  Gross per 24 hour  Intake    410 ml  Output   1100 ml  Net   -690 ml     General appearance: alert, cooperative and in no acute distress, sitting on edge of bed, moving around in the room without oxygen.  Eyes: enucleated right eye, facial edema present Resp: CTA b/l, normal work of breathing Cardio: regular rate and rhythm, S1, S2 normal, no murmur, click, rub or gallop GI: soft, mildly distended, non tender, no guarding, no rebound Extremities: extremities normal, atraumatic, no lower extremity edema.   Lab Results:  Basename 06/20/11 0445 06/19/11 0525  WBC 3.6* 3.9*  HGB 7.2* 7.4*  HCT 22.0* 22.9*  PLT 273 252   BMET  Basename 06/21/11 0824 06/20/11 0445  NA 142 141  K 3.9 3.6  CL 108 109  CO2 22 21  GLUCOSE 82 84  BUN 17 23  CREATININE 2.27* 2.91*  CALCIUM 9.7 9.8    Studies/Results: No results found.  Medications:  Scheduled:    . aspirin EC  81 mg Oral Daily  . azaTHIOprine  100 mg Oral Daily  . calcitRIOL  0.25 mcg Oral Daily  . cefTRIAXone (ROCEPHIN)  IV  1 g Intravenous Q24H  . clotrimazole  10 mg Oral 5 X Daily  . darbepoetin (ARANESP) injection - NON-DIALYSIS  100 mcg  Subcutaneous Q Sat-1800  . heparin  5,000 Units Subcutaneous Q8H  . metoCLOPramide  5 mg Oral QHS  . midodrine  10 mg Oral BID  . mulitivitamin with minerals  1 tablet Oral Daily  . pantoprazole  40 mg Oral Q1200  . predniSONE  20 mg Oral Q breakfast  . sodium chloride  3 mL Intravenous Q12H  . tacrolimus  1 mg Oral BID  . valACYclovir  500 mg Oral Daily  . DISCONTD: methylPREDNISolone (SOLU-MEDROL) injection  40 mg Intravenous Daily  . DISCONTD: piperacillin-tazobactam (ZOSYN)  IV  2.25 g Intravenous Q8H   Continuous:    Assessment/Plan: 46 y.o. year old female with ESRD s/p 3 Renal Transplants presenting with RLQ pain over transplant site and reported fever at home. Associated nausea/vomiting.   # ESRD s/p renal allograft: creatinine acutely increased since admission: but now stable and improving since yesterday.    - possibly from ATN from hypotension episode causing hypoperfusion to kidneys; Also elevated Prograph levels noted 5/16 - Fluid status: no IV fluids. Urine output has greatly improved.  - creatinine now down to 2.27 (baseline 1.7) - f/u prograph level and allow creat to trend down   # UTI and bacteremia: E coli with bacteremia: initially on rocephin, zosyn, keflex and put back on zosyn per renal's recommendations  due to worsening renal function which could have been secondary to infection. Has been on immunosuppressive therapy for transplant and therefore has not had elevated WBC. CT abdomen for 5/10 did not show any abscess, hydronephrosis or other chronic change.  - currently afebrile with stable WBC - patient on zosyn for 5 days (start: 5/14). Patient on day 8 of antibiotics total. - switch to IV rocephin 5/19 to treat e. Coli as zosyn no indicated   # Hypotension:  - Stable on midodrine 10 bid. - Continue to monitor   # Abdominal and right flank pain: Most likely secondary to GERD. 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

## 2011-06-21 NOTE — Progress Notes (Signed)
FMTS Attending Note  Patient seen and examined by me today.  She reports feeling well and without pain.  Her Cr this morning came down to 2.27 (down from 2.91 yesterday).   To continue to monitor serum Cr; tacrolimus level pending; adjustments per renal service.  We look to renal service for input as to when patient is ready to be discharged.  Dalbert Mayotte, MD

## 2011-06-22 LAB — RENAL FUNCTION PANEL
Albumin: 3.5 g/dL (ref 3.5–5.2)
BUN: 15 mg/dL (ref 6–23)
CO2: 23 mEq/L (ref 19–32)
Calcium: 9.7 mg/dL (ref 8.4–10.5)
Chloride: 106 mEq/L (ref 96–112)
Creatinine, Ser: 1.86 mg/dL — ABNORMAL HIGH (ref 0.50–1.10)
GFR calc Af Amer: 37 mL/min — ABNORMAL LOW (ref >90)
GFR calc non Af Amer: 32 mL/min — ABNORMAL LOW (ref >90)
Glucose, Bld: 83 mg/dL (ref 70–99)
Phosphorus: 3.3 mg/dL (ref 2.3–4.6)
Potassium: 3.6 mEq/L (ref 3.5–5.1)
Sodium: 142 mEq/L (ref 135–145)

## 2011-06-22 LAB — CBC
HCT: 23 % — ABNORMAL LOW (ref 36.0–46.0)
Hemoglobin: 7.4 g/dL — ABNORMAL LOW (ref 12.0–15.0)
MCH: 31.1 pg (ref 26.0–34.0)
MCHC: 32.2 g/dL (ref 30.0–36.0)
MCV: 96.6 fL (ref 78.0–100.0)
Platelets: 249 10*3/uL (ref 150–400)
RBC: 2.38 MIL/uL — ABNORMAL LOW (ref 3.87–5.11)
RDW: 22.5 % — ABNORMAL HIGH (ref 11.5–15.5)
WBC: 3 10*3/uL — ABNORMAL LOW (ref 4.0–10.5)

## 2011-06-22 MED ORDER — CEPHALEXIN 500 MG PO CAPS: 500.0000 mg | ORAL_CAPSULE | Freq: Two times a day (BID) | ORAL | Status: AC

## 2011-06-22 MED ORDER — FUROSEMIDE 40 MG PO TABS: 40.0000 mg | ORAL_TABLET | Freq: Two times a day (BID) | ORAL | Status: DC | PRN

## 2011-06-22 MED ORDER — TACROLIMUS 1 MG PO CAPS: 1.0000 mg | ORAL_CAPSULE | Freq: Two times a day (BID) | ORAL | Status: DC

## 2011-06-22 MED ORDER — VALACYCLOVIR HCL 500 MG PO TABS: 500.0000 mg | ORAL_TABLET | Freq: Every day | ORAL | Status: DC

## 2011-06-22 MED ORDER — PREDNISONE 5 MG PO TABS: ORAL_TABLET | ORAL | Status: DC

## 2011-06-22 NOTE — Discharge Instructions (Signed)
It was a pleasure taking care of you during your hospitalization.   Sincerely,   Wonda Amis, MD

## 2011-06-22 NOTE — Discharge Summary (Signed)
Physician Discharge Summary  Patient ID: Tina Watkins MRN: [id number] DOB/AGE: 44-Dec-1967 44 y.o.  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 She presented with RLQ pain over her transplant, nausea and vomiting and fever. She was also found to have a UTI and hypotension.   > Acute on Chronic Renal Failure: This was suspected to be secondary to ATN from hypotension. Her creatinine started to improve, but bumped on 5/13. Thereafter, it was discovered that her tacrolimus level was elevated and could have been causing damage to her kidneys. After hydration and holding her prograf for several days, her kidney function returned to 1.86 on the day of discharge.  She was maintained on her azathioprine.   > Hypotension: This has been a chronic issue. The patient was not taking her midodrine correctly, so that was restarted. She remained normotensive thereafter.   > Infectious Disease: Tina Watkins was found to have E. Coli on her urine and blood culture. This is the second time in less than one month that she has had E. Coli bacteremia. She was treated with 11 days of IV antibiotics and will continue 3 more days at home for a 14 day course.  A urologic consultation was not ordered, but should be considered in the future given the recurrent urosepsis. Additionally, her herpes infection was treated with acyclovir.   > Endocrine: Given the acute  infectious illness, the patient was treated with 20 mg of prednisone for 11 days. She will be tapered by 5 mg every 5 days until she returns to her home dose of 5 mg daily.   >Anemia, Normocytic: Likely secondary to anemia of chronic disease. Last Hgb was 7.4, was stable. She was treated with darbepoetin.  Although, her serum iron level was very low, it was never repleted.   > Gastroparesis: She was treated with home metoclopramide.   > Gout: the patient has a recent flare in her left MCP. However, colchicine was held for concern for her kidneys. It was restarted on discharge.   Consults: nephrology  Significant Diagnostic Studies:    Lab Results  Component Value Date   CREATININE 1.86* 06/22/2011   Tacrolimus level 06/18/11: 22.8    Discharge Exam: Blood pressure 106/65, pulse 68, temperature 98.9 F (37.2 C), temperature source Oral, resp. rate 16, height 4\' 11"  (1.499 m), weight 142 lb 13.7 oz (64.8 kg), SpO2 100.00%. General appearance: middle aged AAF, alert, cooperative and in no acute distress, lying in bed with nasal canula in place  Eyes: enucleated right eye, facial edema present  Resp: CTA b/l, normal work of breathing  Cardio: regular rate and rhythm, S1, S2 normal, no murmur, click, rub or gallop  GI: soft, mildly distended, non tender, no guarding, no rebound  Extremities: extremities normal, atraumatic, no lower extremity edema.    Disposition: 01-Home or Self Care   Medication List  As of 06/23/2011  4:06 PM   TAKE these medications  ALPRAZolam 0.25 MG tablet   Commonly known as: XANAX   Take 0.25 mg by mouth 2 (two) times daily as needed. For anxiety      AMBIEN 10 MG tablet   Generic drug: zolpidem   Take 10 mg by mouth at bedtime as needed. To help sleep.      aspirin EC 81 MG tablet   Take 81 mg by mouth daily.      azaTHIOprine 50 MG tablet   Commonly known as: IMURAN   Take 100 mg by mouth daily.      calcitRIOL 0.25 MCG capsule    Commonly known as: ROCALTROL   Take 0.25 mcg by mouth daily.      calcium carbonate 750 MG chewable tablet   Commonly known as: TUMS EX   Chew 1-2 tablets by mouth as needed. For stomach      cephALEXin 500 MG capsule   Commonly known as: KEFLEX   Take 1 capsule (500 mg total) by mouth 2 (two) times daily.      colchicine 0.6 MG tablet   Take 0.6 mg by mouth daily.      epoetin alfa 10000 UNIT/ML injection   Commonly known as: EPOGEN,PROCRIT   Inject 40,000 Units into the skin every 30 (thirty) days.      furosemide 40 MG tablet   Commonly known as: LASIX   Take 1 tablet (40 mg total) by mouth 2 (two) times daily as needed.      metoCLOPramide 5 MG tablet   Commonly known as: REGLAN   Take 5 mg by mouth at bedtime.      midodrine 10 MG tablet   Commonly known as: PROAMATINE   Take 10 mg by mouth 2 (two) times daily.      mulitivitamin with minerals Tabs   Take 1 tablet by mouth daily.      omeprazole 40 MG capsule   Commonly known as: PRILOSEC   Take 40 mg by mouth 2 (two) times daily.      Oxycodone HCl 10 MG Tabs   Take 10 mg by mouth 4 (four) times daily as needed. For pain      oxyCODONE-acetaminophen 10-650 MG per tablet   Commonly known as: PERCOCET   Take 1 tablet by mouth every 6 (six) hours as needed. For pain.      predniSONE 5 MG tablet   Commonly known as: DELTASONE   Take 15 mg for 5 days. Then 10 mg for 5 days. Then 5 mg daily thereafter.      promethazine 25 MG tablet   Commonly known as: PHENERGAN   Take 25 mg by mouth every 6 (six) hours as needed. For nausea        rosuvastatin 5 MG tablet   Commonly known as: CRESTOR   Take 5 mg by mouth daily.      tacrolimus 1 MG capsule   Commonly known as: PROGRAF   Take 1 capsule (1 mg total) by mouth 2 (two) times daily.      valACYclovir 500 MG tablet   Commonly known as: VALTREX   Take 1 tablet (500 mg total) by mouth daily.           Follow-up Information    Follow up with  [name], MD on 06/25/2011. (9:15 am)    Contact information:   [place]          Follow Up  Issues: 1. Check creatinine 2. Check hemoglobin and consider Fe supplementation 3. Consider urology referral given recurrent E. Coli UTI causing bacteremia and sepsis    Signed: Dewain Penning 06/23/2011, 4:06 PM

## 2011-06-22 NOTE — Progress Notes (Signed)
   CARE MANAGEMENT NOTE 06/22/2011  Patient:  Tina Watkins, Tina Watkins   Account Number:  [id number]  Date Initiated:  06/15/2011  Documentation initiated by:  Lizabeth Leyden  Subjective/Objective Assessment:   Patient admitted with RUQ pain, fever and hx renal transplant 2005     Action/Plan:   Progression of care and discharge planing   Anticipated DC Date:  06/16/2011   Anticipated DC Plan:  [place]  CM consult      Choice offered to / List presented to:             Status of service:  In process, will continue to follow Medicare Important Message given?   (If response is "NO", the following Medicare IM given date fields will be blank) Date Medicare IM given:   Date Additional Medicare IM given:    Discharge Disposition:    Per UR Regulation:    If discussed at Long Length of Stay Meetings, dates discussed:    Comments:  06/22/2011 [place], [place] Met with patient to discuss discharge planning and any CM issues. She has difficulties at times with the cost of all the copays for her medications. [facility] and gets 30 refills, she has looked for any programs that may assist with the costs. Provided her with the web site needymeds.com for possible options.  06/15/2011 [place], [place] Met with patient to discuss CM and discharge planning process. She does not anticipate any need for home health at this time. CM will continue to follow for discharge needs.

## 2011-06-22 NOTE — Progress Notes (Signed)
Daily Progress Note Tina Watkins. Maricela Bo, M.D., M.B.A  Family Medicine PGY-1 Pager [phone]  Subjective:  Pt eating breakfast this AM; states that her face is less swollen and feels like she has less fluid on her; Also easier to breath than previous days; would like to go home if creat trending down   Objective: Vital signs in last 24 hours: Temp:  [98 F (36.7 C)-98.6 F (37 C)] 98.1 F (36.7 C) (05/20 0846) Pulse Rate:  [54-68] 58  (05/20 0846) Resp:  [17-19] 17  (05/20 0846) BP: (102-119)/(57-99) 115/70 mmHg (05/20 0846) SpO2:  [99 %-100 %] 100 % (05/20 0846) Weight:  [142 lb 13.7 oz (64.8 kg)] 142 lb 13.7 oz (64.8 kg) (05/19 2220) Weight change: 2 lb 2.5 oz (0.979 kg) Last BM Date: 06/21/11  Intake/Output from previous day:  Intake/Output Summary (Last 24 hours) at 06/22/11 0943 Last data filed at 06/22/11 0845  Gross per 24 hour  Intake    410 ml  Output   2200 ml  Net  -1790 ml     General appearance: middle aged AAF, alert, cooperative and in no acute distress, lying in bed with nasal canula in place Eyes: enucleated right eye, facial edema present Resp: CTA b/l, normal work of breathing Cardio: regular rate and rhythm, S1, S2 normal, no murmur, click, rub or gallop GI: soft, mildly distended, non tender, no guarding, no rebound Extremities: extremities normal, atraumatic, no lower extremity edema.   Lab Results:  [facility] 06/20/11 0445  WBC 3.6*  HGB 7.2*  HCT 22.0*  PLT 273   BMET  Basename 06/21/11 0824 06/20/11 0445  NA 142 141  K 3.9 3.6  CL 108 109  CO2 22 21  GLUCOSE 82 84  BUN 17 23  CREATININE 2.27* 2.91*  CALCIUM 9.7 9.8    Studies/Results: No results found.  Medications:  Scheduled:    . aspirin EC  81 mg Oral Daily  . azaTHIOprine  100 mg Oral Daily  . calcitRIOL  0.25 mcg Oral Daily  . cefTRIAXone (ROCEPHIN)  IV  1 g Intravenous Q24H  . clotrimazole  10 mg Oral 5 X Daily  . darbepoetin (ARANESP) injection - NON-DIALYSIS   100 mcg Subcutaneous Q Sat-1800  . heparin  5,000 Units Subcutaneous Q8H  . metoCLOPramide  5 mg Oral QHS  . midodrine  10 mg Oral BID  . mulitivitamin with minerals  1 tablet Oral Daily  . pantoprazole  40 mg Oral Q1200  . predniSONE  20 mg Oral Q breakfast  . sodium chloride  3 mL Intravenous Q12H  . tacrolimus  1 mg Oral BID  . valACYclovir  500 mg Oral Daily  . DISCONTD: piperacillin-tazobactam (ZOSYN)  IV  2.25 g Intravenous Q8H   Continuous:    Assessment/Plan: 46 y.o. year old female with ESRD s/p 3 Renal Transplants presenting with RLQ pain over transplant site and reported fever at home. Associated nausea/vomiting.   # ESRD s/p renal allograft: acute kidney injury to transplanted kidney   - possibly from ATN from hypotension episode causing hypoperfusion to kidneys; Also elevated Prograph levels noted 5/16 - Fluid status: no IV fluids. Urine output has greatly improved.  - f/u prograph level and allow creat to trend down  - creat 2.27 on 5/19, if trends down again this AM then possible d/c if renal recommends   # UTI and bacteremia: E coli with bacteremia: initially on rocephin, zosyn, keflex and put back on zosyn per renal's recommendations due to  worsening renal function which could have been secondary to infection. Has been on immunosuppressive therapy for transplant and therefore has not had elevated WBC. CT abdomen for 5/10 did not show any abscess, hydronephrosis or other chronic change.  - currently afebrile with stable WBC - patient on zosyn for 5 days (start: 5/14). Patient on day 8 of antibiotics total. - switched to IV rocephin 5/19 to treat E Coli as zosyn no indicated; this is day 11 of antibiotics (all of which were effective for E. Coli), so only 3 more days needed    # Hypotension:  - Stable on midodrine 10 bid. - Continue to monitor   # Abdominal and right flank pain: Most likely secondary to GERD. 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

## 2011-06-22 NOTE — Progress Notes (Signed)
[facility] Progress Note  Subjective:  Would like to go home Objective Filed Vitals:   06/21/11 2220 06/22/11 0515 06/22/11 0846 06/22/11 1400  BP: 113/69 119/62 115/70 116/64  Pulse: 68 54 58 60  Temp: 98 F (36.7 C) 98.2 F (36.8 C) 98.1 F (36.7 C) 99.4 F (37.4 C)  TempSrc: Oral Oral Oral Oral  Resp: 18 18 17 18   Height:      Weight: 64.8 kg (142 lb 13.7 oz)     SpO2: 99% 100% 100% 100%  I/O last 3 completed shifts: In: 530 [P.O.:480; IV Piggyback:50] Out: 1100 [Urine:1100] Total I/O In: 120 [P.O.:120] Out: 2300 [Urine:2300]   Physical Exam General: Feels well  Wants to go NAD  Facial edema Heart:S1S2 no S3 Prominent superficial veins on chest wall Lungs:clear Abdomen:soft not tender Extremities: edema dec   Additional Objective Labs: Basic Metabolic Panel:  Lab 0000000 0945 06/21/11 0824 06/20/11 0445  NA 142 142 141  K 3.6 3.9 3.6  CL 106 108 109  CO2 23 22 21   GLUCOSE 83 82 84  BUN 15 17 23   CREATININE 1.86* 2.27* 2.91*  CALCIUM 9.7 9.7 9.8  ALB -- -- --  PHOS 3.3 3.8 3.6   Liver Function Tests:  Lab 06/22/11 0945 06/21/11 0824 06/20/11 0445  AST -- -- --  ALT -- -- --  ALKPHOS -- -- --  BILITOT -- -- --  PROT -- -- --  ALBUMIN 3.5 3.4* 3.2*   Lab 06/22/11 0945 06/20/11 0445 06/19/11 0525 06/17/11 1603 06/16/11 0500  WBC 3.0* 3.6* 3.9* -- --  NEUTROABS -- -- 2.7 -- --  HGB 7.4* 7.2* 7.4* -- --  HCT 23.0* 22.0* 22.9* -- --  MCV 96.6 95.2 94.6 92.9 93.9  PLT 249 273 252 -- --   Iron Studies: No results found for this basename: IRON,TIBC,TRANSFERRIN,FERRITIN in the last 72 hours Studies/Results: No results found. Medications:   . aspirin EC  81 mg Oral Daily  . azaTHIOprine  100 mg Oral Daily  . calcitRIOL  0.25 mcg Oral Daily  . cefTRIAXone (ROCEPHIN)  IV  1 g Intravenous Q24H  . clotrimazole  10 mg Oral 5 X Daily  . darbepoetin (ARANESP) injection - NON-DIALYSIS  100 mcg Subcutaneous Q Sat-1800  . heparin  5,000  Units Subcutaneous Q8H  . metoCLOPramide  5 mg Oral QHS  . midodrine  10 mg Oral BID  . mulitivitamin with minerals  1 tablet Oral Daily  . pantoprazole  40 mg Oral Q1200  . predniSONE  20 mg Oral Q breakfast  . sodium chloride  3 mL Intravenous Q12H  . tacrolimus  1 mg Oral BID  . valACYclovir  500 mg Oral Daily    I  have reviewed scheduled and prn medications.  Assessment/Recommendations:  1. UTI / urosepsis with renal allograft site pain: Negative CT for abscess/obstruction/calculi initially Renal function worsened for unclear reasons because otherwise clinically was doing well. Now know that her prograf level was high. E coli appears fairly sensitive, but given that creatinine was worsening, I feel better if on IV antibiotics for her bacteremia until discharge - could switch today as should be fine to go. More recent renal ultrasound and U/A look fine. Creatinine has continued to improve 2. Hypotension: BP improved/stable on midodrine. Question whether renal function took a little hit with low BP. Have d/c d lasix, gave IVF and stress dose steroids BP is better.Now no IVF, have weaned steroids.  3. Anemia: Likely anemia  of chronic kidney disease compounded by recent infection/hospitalizations and inflammatory resistance to erythropoietin. On ESA  4. Gastroparesis: Continue metoclopramide therapy.  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 Is stable, no intervention needed  6. Dispo-Prograf level from 5/19 still pending. Took her from 3/2 regimen to 1/1 after holding 2 doses. Renal function is better.  I think she could be D/C with the following arrangements:  Hospital followup with Dr. Marval Regal is arranged for Thursday, May 23rd at 9:15 AM for labs and office appointment (will obtain prograf level, CBC and renal at that appt)  Please send home on 1 mg prograf BID and we will recheck level at office appt Please  change to a po cephalosporin to finish a 2 week course.      06/22/2011,2:43 PM  LOS: 10 days

## 2011-06-22 NOTE — Progress Notes (Signed)
Family Medicine Teaching Service Attending Note  I discussed patient Tina Watkins  with Dr. Maricela Bo and reviewed their note for today.  I agree with their assessment and plan.     If Nephrology agrees ok for discharge today

## 2011-06-22 NOTE — Progress Notes (Signed)
Pt o2 sat on RA = 99%. Tina Watkins

## 2011-06-23 ENCOUNTER — Encounter (HOSPITAL_COMMUNITY): Payer: Self-pay | Admitting: Family Medicine

## 2011-06-23 DIAGNOSIS — M109 Gout, unspecified: Secondary | ICD-10-CM | POA: Diagnosis present

## 2011-06-23 DIAGNOSIS — B009 Herpesviral infection, unspecified: Secondary | ICD-10-CM | POA: Diagnosis present

## 2011-06-23 DIAGNOSIS — F419 Anxiety disorder, unspecified: Secondary | ICD-10-CM | POA: Diagnosis present

## 2011-06-23 NOTE — Discharge Summary (Signed)
I have reviewed this discharge summary and agree.    

## 2011-06-24 LAB — TACROLIMUS LEVEL: Tacrolimus (FK506) - LabCorp: 3.1 ng/mL

## 2011-07-13 ENCOUNTER — Encounter (HOSPITAL_COMMUNITY)
Admission: RE | Admit: 2011-07-13 | Discharge: 2011-07-13 | Disposition: A | Discharge status: Home / Self Care | Payer: Medicaid Other | Source: Ambulatory Visit | Attending: Nephrology | Admitting: Nephrology

## 2011-07-13 DIAGNOSIS — D638 Anemia in other chronic diseases classified elsewhere: Secondary | ICD-10-CM | POA: Insufficient documentation

## 2011-07-13 DIAGNOSIS — N183 Chronic kidney disease, stage 3 unspecified: Secondary | ICD-10-CM | POA: Insufficient documentation

## 2011-07-13 LAB — POCT HEMOGLOBIN-HEMACUE: Hemoglobin: 9.7 g/dL — ABNORMAL LOW (ref 12.0–15.0)

## 2011-07-13 MED ORDER — DARBEPOETIN ALFA-POLYSORBATE 60 MCG/0.3ML IJ SOLN
INTRAMUSCULAR | Status: AC
  Administered 2011-07-13: 60 ug via SUBCUTANEOUS
  Filled 2011-07-13: qty 0.3

## 2011-07-13 MED ORDER — EPOETIN ALFA 40000 UNIT/ML IJ SOLN
INTRAMUSCULAR | Status: AC
  Filled 2011-07-13: qty 1

## 2011-07-13 MED ORDER — DARBEPOETIN ALFA-POLYSORBATE 60 MCG/0.3ML IJ SOLN: 60.0000 ug | INTRAMUSCULAR | Status: DC

## 2011-07-13 MED ORDER — EPOETIN ALFA 10000 UNIT/ML IJ SOLN: 40000.0000 [IU] | INTRAMUSCULAR | Status: DC

## 2011-07-13 MED ORDER — DARBEPOETIN ALFA-POLYSORBATE 60 MCG/0.3ML IJ SOLN
60.0000 ug | INTRAMUSCULAR | Status: DC
  Administered 2011-07-13: 60 ug via SUBCUTANEOUS

## 2011-07-14 LAB — FERRITIN: Ferritin: 1721 ng/mL — ABNORMAL HIGH (ref 10–291)

## 2011-07-14 LAB — IRON AND TIBC
Iron: 91 ug/dL (ref 42–135)
Saturation Ratios: 38 % (ref 20–55)
TIBC: 241 ug/dL — ABNORMAL LOW (ref 250–470)
UIBC: 150 ug/dL (ref 125–400)
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
## 2011-08-07 ENCOUNTER — Encounter (HOSPITAL_COMMUNITY): Payer: Medicaid Other

## 2011-08-10 ENCOUNTER — Encounter (HOSPITAL_COMMUNITY)
Admission: RE | Admit: 2011-08-10 | Discharge: 2011-08-10 | Disposition: A | Discharge status: Home / Self Care | Payer: Medicaid Other | Source: Ambulatory Visit | Attending: Nephrology | Admitting: Nephrology

## 2011-08-10 DIAGNOSIS — N183 Chronic kidney disease, stage 3 unspecified: Secondary | ICD-10-CM | POA: Insufficient documentation

## 2011-08-10 DIAGNOSIS — D638 Anemia in other chronic diseases classified elsewhere: Secondary | ICD-10-CM | POA: Insufficient documentation

## 2011-08-10 LAB — RENAL FUNCTION PANEL
Albumin: 4.5 g/dL (ref 3.5–5.2)
BUN: 32 mg/dL — ABNORMAL HIGH (ref 6–23)
CO2: 23 mEq/L (ref 19–32)
Calcium: 10.6 mg/dL — ABNORMAL HIGH (ref 8.4–10.5)
Chloride: 98 mEq/L (ref 96–112)
Creatinine, Ser: 2.51 mg/dL — ABNORMAL HIGH (ref 0.50–1.10)
GFR calc Af Amer: 26 mL/min — ABNORMAL LOW (ref >90)
GFR calc non Af Amer: 22 mL/min — ABNORMAL LOW (ref >90)
Glucose, Bld: 71 mg/dL (ref 70–99)
Phosphorus: 3.4 mg/dL (ref 2.3–4.6)
Potassium: 3.7 mEq/L (ref 3.5–5.1)
Sodium: 137 mEq/L (ref 135–145)

## 2011-08-10 LAB — POCT HEMOGLOBIN-HEMACUE: Hemoglobin: 10.3 g/dL — ABNORMAL LOW (ref 12.0–15.0)

## 2011-08-10 MED ORDER — DARBEPOETIN ALFA-POLYSORBATE 60 MCG/0.3ML IJ SOLN
60.0000 ug | INTRAMUSCULAR | Status: DC
  Administered 2011-08-10: 60 ug via INTRAVENOUS
  Filled 2011-08-10: qty 0.3

## 2011-09-07 ENCOUNTER — Other Ambulatory Visit: Payer: Self-pay

## 2011-09-11 ENCOUNTER — Encounter (HOSPITAL_COMMUNITY)
Admission: RE | Admit: 2011-09-11 | Discharge: 2011-09-11 | Disposition: A | Discharge status: Home / Self Care | Payer: Medicaid Other | Source: Ambulatory Visit | Attending: Nephrology | Admitting: Nephrology

## 2011-09-11 DIAGNOSIS — N183 Chronic kidney disease, stage 3 unspecified: Secondary | ICD-10-CM | POA: Insufficient documentation

## 2011-09-11 DIAGNOSIS — D638 Anemia in other chronic diseases classified elsewhere: Secondary | ICD-10-CM | POA: Insufficient documentation

## 2011-09-11 LAB — RENAL FUNCTION PANEL
Albumin: 4 g/dL (ref 3.5–5.2)
BUN: 30 mg/dL — ABNORMAL HIGH (ref 6–23)
CO2: 21 mEq/L (ref 19–32)
Calcium: 10.1 mg/dL (ref 8.4–10.5)
Chloride: 105 mEq/L (ref 96–112)
Creatinine, Ser: 2.28 mg/dL — ABNORMAL HIGH (ref 0.50–1.10)
GFR calc Af Amer: 29 mL/min — ABNORMAL LOW (ref >90)
GFR calc non Af Amer: 25 mL/min — ABNORMAL LOW (ref >90)
Glucose, Bld: 76 mg/dL (ref 70–99)
Phosphorus: 3.5 mg/dL (ref 2.3–4.6)
Potassium: 4.1 mEq/L (ref 3.5–5.1)
Sodium: 140 mEq/L (ref 135–145)

## 2011-09-11 LAB — IRON AND TIBC
Iron: 89 ug/dL (ref 42–135)
Saturation Ratios: 41 % (ref 20–55)
TIBC: 218 ug/dL — ABNORMAL LOW (ref 250–470)
UIBC: 129 ug/dL (ref 125–400)

## 2011-09-11 LAB — POCT HEMOGLOBIN-HEMACUE: Hemoglobin: 9.7 g/dL — ABNORMAL LOW (ref 12.0–15.0)

## 2011-09-11 MED ORDER — DARBEPOETIN ALFA-POLYSORBATE 60 MCG/0.3ML IJ SOLN
60.0000 ug | INTRAMUSCULAR | Status: DC
  Administered 2011-09-11: 60 ug via INTRAVENOUS
  Filled 2011-09-11: qty 0.3

## 2011-09-12 LAB — FERRITIN: Ferritin: 1625 ng/mL — ABNORMAL HIGH (ref 10–291)

## 2011-10-02 ENCOUNTER — Other Ambulatory Visit: Payer: Self-pay

## 2011-10-08 ENCOUNTER — Other Ambulatory Visit (HOSPITAL_COMMUNITY): Payer: Self-pay | Admitting: *Deleted

## 2011-10-09 ENCOUNTER — Encounter (HOSPITAL_COMMUNITY)
Admission: RE | Admit: 2011-10-09 | Discharge: 2011-10-09 | Disposition: A | Discharge status: Home / Self Care | Payer: Medicaid Other | Source: Ambulatory Visit | Attending: Nephrology | Admitting: Nephrology

## 2011-10-09 DIAGNOSIS — D638 Anemia in other chronic diseases classified elsewhere: Secondary | ICD-10-CM | POA: Insufficient documentation

## 2011-10-09 DIAGNOSIS — N183 Chronic kidney disease, stage 3 unspecified: Secondary | ICD-10-CM | POA: Insufficient documentation

## 2011-10-09 LAB — POCT HEMOGLOBIN-HEMACUE: Hemoglobin: 11.5 g/dL — ABNORMAL LOW (ref 12.0–15.0)

## 2011-10-09 LAB — RENAL FUNCTION PANEL
Albumin: 4.3 g/dL (ref 3.5–5.2)
BUN: 28 mg/dL — ABNORMAL HIGH (ref 6–23)
CO2: 25 mEq/L (ref 19–32)
Calcium: 10.5 mg/dL (ref 8.4–10.5)
Chloride: 100 mEq/L (ref 96–112)
Creatinine, Ser: 2.3 mg/dL — ABNORMAL HIGH (ref 0.50–1.10)
GFR calc Af Amer: 29 mL/min — ABNORMAL LOW (ref >90)
GFR calc non Af Amer: 25 mL/min — ABNORMAL LOW (ref >90)
Glucose, Bld: 85 mg/dL (ref 70–99)
Phosphorus: 3.1 mg/dL (ref 2.3–4.6)
Potassium: 3.6 mEq/L (ref 3.5–5.1)
Sodium: 138 mEq/L (ref 135–145)

## 2011-10-09 MED ORDER — DARBEPOETIN ALFA-POLYSORBATE 60 MCG/0.3ML IJ SOLN
60.0000 ug | INTRAMUSCULAR | Status: DC
Start: 2011-10-09 — End: 2011-10-10
  Filled 2011-10-09: qty 0.3

## 2011-10-09 MED ORDER — DARBEPOETIN ALFA-POLYSORBATE 60 MCG/0.3ML IJ SOLN
60.0000 ug | Freq: Once | INTRAMUSCULAR | Status: AC
  Administered 2011-10-09: 60 ug via SUBCUTANEOUS

## 2011-11-06 ENCOUNTER — Encounter (HOSPITAL_COMMUNITY): Payer: Medicaid Other

## 2011-11-10 ENCOUNTER — Encounter (HOSPITAL_COMMUNITY): Payer: Medicaid Other

## 2011-11-23 ENCOUNTER — Other Ambulatory Visit: Payer: Self-pay | Admitting: Nephrology

## 2011-11-23 DIAGNOSIS — N6310 Unspecified lump in the right breast, unspecified quadrant: Secondary | ICD-10-CM

## 2011-11-24 ENCOUNTER — Ambulatory Visit
Admission: RE | Admit: 2011-11-24 | Discharge: 2011-11-24 | Disposition: A | Discharge status: Home / Self Care | Payer: Medicaid Other | Source: Ambulatory Visit | Attending: Nephrology | Admitting: Nephrology

## 2011-11-24 ENCOUNTER — Other Ambulatory Visit: Payer: Self-pay | Admitting: Nephrology

## 2011-11-24 DIAGNOSIS — N6310 Unspecified lump in the right breast, unspecified quadrant: Secondary | ICD-10-CM

## 2011-12-04 ENCOUNTER — Other Ambulatory Visit: Payer: Self-pay | Admitting: Nephrology

## 2011-12-04 ENCOUNTER — Encounter (HOSPITAL_COMMUNITY)
Admission: RE | Admit: 2011-12-04 | Discharge: 2011-12-04 | Disposition: A | Discharge status: Home / Self Care | Payer: Medicaid Other | Source: Ambulatory Visit | Attending: Nephrology | Admitting: Nephrology

## 2011-12-04 DIAGNOSIS — N183 Chronic kidney disease, stage 3 unspecified: Secondary | ICD-10-CM | POA: Insufficient documentation

## 2011-12-04 DIAGNOSIS — N6489 Other specified disorders of breast: Secondary | ICD-10-CM

## 2011-12-04 DIAGNOSIS — D638 Anemia in other chronic diseases classified elsewhere: Secondary | ICD-10-CM | POA: Insufficient documentation

## 2011-12-04 LAB — RENAL FUNCTION PANEL
Albumin: 4.3 g/dL (ref 3.5–5.2)
BUN: 33 mg/dL — ABNORMAL HIGH (ref 6–23)
CO2: 28 mEq/L (ref 19–32)
Calcium: 10.2 mg/dL (ref 8.4–10.5)
Chloride: 100 mEq/L (ref 96–112)
Creatinine, Ser: 2.3 mg/dL — ABNORMAL HIGH (ref 0.50–1.10)
GFR calc Af Amer: 29 mL/min — ABNORMAL LOW (ref >90)
GFR calc non Af Amer: 25 mL/min — ABNORMAL LOW (ref >90)
Glucose, Bld: 81 mg/dL (ref 70–99)
Phosphorus: 3.3 mg/dL (ref 2.3–4.6)
Potassium: 3.8 mEq/L (ref 3.5–5.1)
Sodium: 138 mEq/L (ref 135–145)

## 2011-12-04 LAB — POCT HEMOGLOBIN-HEMACUE: Hemoglobin: 11.1 g/dL — ABNORMAL LOW (ref 12.0–15.0)

## 2011-12-04 MED ORDER — DARBEPOETIN ALFA-POLYSORBATE 60 MCG/0.3ML IJ SOLN
60.0000 ug | INTRAMUSCULAR | Status: DC
  Administered 2011-12-04: 60 ug via SUBCUTANEOUS

## 2011-12-04 MED ORDER — DARBEPOETIN ALFA-POLYSORBATE 60 MCG/0.3ML IJ SOLN
INTRAMUSCULAR | Status: AC
  Administered 2011-12-04: 60 ug via SUBCUTANEOUS
  Filled 2011-12-04: qty 0.3

## 2011-12-05 LAB — IRON AND TIBC
Iron: 113 ug/dL (ref 42–135)
Saturation Ratios: 49 % (ref 20–55)
TIBC: 231 ug/dL — ABNORMAL LOW (ref 250–470)
UIBC: 118 ug/dL — ABNORMAL LOW (ref 125–400)

## 2011-12-05 LAB — FERRITIN: Ferritin: 1486 ng/mL — ABNORMAL HIGH (ref 10–291)

## 2011-12-08 ENCOUNTER — Encounter (HOSPITAL_COMMUNITY): Payer: Self-pay | Admitting: *Deleted

## 2011-12-08 ENCOUNTER — Emergency Department (HOSPITAL_COMMUNITY)
Admission: EM | Admit: 2011-12-08 | Discharge: 2011-12-08 | Disposition: A | Discharge status: Home / Self Care | Payer: Medicaid Other | Attending: Emergency Medicine | Admitting: Emergency Medicine

## 2011-12-08 ENCOUNTER — Emergency Department (HOSPITAL_COMMUNITY): Payer: Medicaid Other

## 2011-12-08 DIAGNOSIS — R7881 Bacteremia: Secondary | ICD-10-CM | POA: Insufficient documentation

## 2011-12-08 DIAGNOSIS — Z87891 Personal history of nicotine dependence: Secondary | ICD-10-CM | POA: Insufficient documentation

## 2011-12-08 DIAGNOSIS — Z79899 Other long term (current) drug therapy: Secondary | ICD-10-CM | POA: Insufficient documentation

## 2011-12-08 DIAGNOSIS — R000 Tachycardia, unspecified: Secondary | ICD-10-CM | POA: Insufficient documentation

## 2011-12-08 DIAGNOSIS — I959 Hypotension, unspecified: Secondary | ICD-10-CM | POA: Insufficient documentation

## 2011-12-08 DIAGNOSIS — N186 End stage renal disease: Secondary | ICD-10-CM | POA: Insufficient documentation

## 2011-12-08 DIAGNOSIS — R112 Nausea with vomiting, unspecified: Secondary | ICD-10-CM | POA: Insufficient documentation

## 2011-12-08 DIAGNOSIS — K5900 Constipation, unspecified: Secondary | ICD-10-CM | POA: Insufficient documentation

## 2011-12-08 DIAGNOSIS — Z7982 Long term (current) use of aspirin: Secondary | ICD-10-CM | POA: Insufficient documentation

## 2011-12-08 DIAGNOSIS — M109 Gout, unspecified: Secondary | ICD-10-CM | POA: Insufficient documentation

## 2011-12-08 DIAGNOSIS — I6350 Cerebral infarction due to unspecified occlusion or stenosis of unspecified cerebral artery: Secondary | ICD-10-CM | POA: Insufficient documentation

## 2011-12-08 DIAGNOSIS — Z940 Kidney transplant status: Secondary | ICD-10-CM | POA: Insufficient documentation

## 2011-12-08 DIAGNOSIS — A6000 Herpesviral infection of urogenital system, unspecified: Secondary | ICD-10-CM | POA: Insufficient documentation

## 2011-12-08 DIAGNOSIS — R109 Unspecified abdominal pain: Secondary | ICD-10-CM | POA: Insufficient documentation

## 2011-12-08 LAB — COMPREHENSIVE METABOLIC PANEL
ALT: 9 U/L (ref 0–35)
AST: 16 U/L (ref 0–37)
Albumin: 4.4 g/dL (ref 3.5–5.2)
Alkaline Phosphatase: 73 U/L (ref 39–117)
BUN: 36 mg/dL — ABNORMAL HIGH (ref 6–23)
CO2: 26 mEq/L (ref 19–32)
Calcium: 10.4 mg/dL (ref 8.4–10.5)
Chloride: 100 mEq/L (ref 96–112)
Creatinine, Ser: 2.62 mg/dL — ABNORMAL HIGH (ref 0.50–1.10)
GFR calc Af Amer: 24 mL/min — ABNORMAL LOW (ref >90)
GFR calc non Af Amer: 21 mL/min — ABNORMAL LOW (ref >90)
Glucose, Bld: 85 mg/dL (ref 70–99)
Potassium: 3 mEq/L — ABNORMAL LOW (ref 3.5–5.1)
Sodium: 138 mEq/L (ref 135–145)
Total Bilirubin: 0.4 mg/dL (ref 0.3–1.2)
Total Protein: 7.2 g/dL (ref 6.0–8.3)

## 2011-12-08 LAB — CBC WITH DIFFERENTIAL/PLATELET
Basophils Absolute: 0 10*3/uL (ref 0.0–0.1)
Basophils Relative: 0 % (ref 0–1)
Eosinophils Absolute: 0 10*3/uL (ref 0.0–0.7)
Eosinophils Relative: 1 % (ref 0–5)
HCT: 29.2 % — ABNORMAL LOW (ref 36.0–46.0)
Hemoglobin: 10.4 g/dL — ABNORMAL LOW (ref 12.0–15.0)
Lymphocytes Relative: 11 % — ABNORMAL LOW (ref 12–46)
Lymphs Abs: 0.5 10*3/uL — ABNORMAL LOW (ref 0.7–4.0)
MCH: 31 pg (ref 26.0–34.0)
MCHC: 35.6 g/dL (ref 30.0–36.0)
MCV: 86.9 fL (ref 78.0–100.0)
Monocytes Absolute: 0.4 10*3/uL (ref 0.1–1.0)
Monocytes Relative: 10 % (ref 3–12)
Neutro Abs: 3.6 10*3/uL (ref 1.7–7.7)
Neutrophils Relative %: 79 % — ABNORMAL HIGH (ref 43–77)
Platelets: 182 10*3/uL (ref 150–400)
RBC: 3.36 MIL/uL — ABNORMAL LOW (ref 3.87–5.11)
RDW: 17.8 % — ABNORMAL HIGH (ref 11.5–15.5)
WBC: 4.6 10*3/uL (ref 4.0–10.5)

## 2011-12-08 LAB — PREGNANCY, URINE: Preg Test, Ur: NEGATIVE

## 2011-12-08 LAB — URINALYSIS, ROUTINE W REFLEX MICROSCOPIC
Bilirubin Urine: NEGATIVE
Glucose, UA: NEGATIVE mg/dL
Hgb urine dipstick: NEGATIVE
Ketones, ur: 15 mg/dL — AB
Leukocytes, UA: NEGATIVE
Nitrite: NEGATIVE
Protein, ur: 30 mg/dL — AB
Specific Gravity, Urine: 1.016 (ref 1.005–1.030)
Urobilinogen, UA: 0.2 mg/dL (ref 0.0–1.0)
pH: 5.5 (ref 5.0–8.0)

## 2011-12-08 LAB — URINE MICROSCOPIC-ADD ON

## 2011-12-08 MED ORDER — MINERAL OIL RE ENEM: 1.0000 | ENEMA | Freq: Once | RECTAL | Status: DC

## 2011-12-08 MED ORDER — IOHEXOL 300 MG/ML  SOLN
20.0000 mL | INTRAMUSCULAR | Status: AC
Start: 2011-12-08 — End: 2011-12-08

## 2011-12-08 MED ORDER — POLYETHYLENE GLYCOL 3350 17 GM/SCOOP PO POWD: 17.0000 g | Freq: Every day | ORAL | Status: DC

## 2011-12-08 MED ORDER — HYDROMORPHONE HCL PF 2 MG/ML IJ SOLN
2.0000 mg | Freq: Once | INTRAMUSCULAR | Status: AC
  Administered 2011-12-08: 2 mg via INTRAMUSCULAR

## 2011-12-08 MED ORDER — ONDANSETRON HCL 4 MG/2ML IJ SOLN
4.0000 mg | Freq: Once | INTRAMUSCULAR | Status: AC
  Administered 2011-12-08: 4 mg via INTRAMUSCULAR

## 2011-12-08 MED ORDER — HYDROMORPHONE HCL PF 2 MG/ML IJ SOLN
INTRAMUSCULAR | Status: AC
  Administered 2011-12-08: 2 mg via INTRAMUSCULAR
  Filled 2011-12-08: qty 1

## 2011-12-08 MED ORDER — POTASSIUM CHLORIDE CRYS ER 20 MEQ PO TBCR
40.0000 meq | EXTENDED_RELEASE_TABLET | Freq: Once | ORAL | Status: AC
  Administered 2011-12-08: 40 meq via ORAL
  Filled 2011-12-08: qty 2

## 2011-12-08 MED ORDER — FENTANYL CITRATE 0.05 MG/ML IJ SOLN
50.0000 ug | Freq: Once | INTRAMUSCULAR | Status: AC
  Administered 2011-12-08: 50 ug via INTRAVENOUS
  Filled 2011-12-08: qty 2

## 2011-12-08 MED ORDER — ONDANSETRON HCL 4 MG/2ML IJ SOLN
INTRAMUSCULAR | Status: AC
  Administered 2011-12-08: 4 mg via INTRAMUSCULAR
  Filled 2011-12-08: qty 2

## 2011-12-08 MED ORDER — ONDANSETRON HCL 4 MG/2ML IJ SOLN
4.0000 mg | Freq: Once | INTRAMUSCULAR | Status: AC
  Administered 2011-12-08: 4 mg via INTRAVENOUS
  Filled 2011-12-08: qty 2

## 2011-12-08 MED ORDER — SODIUM CHLORIDE 0.9 % IV SOLN
Freq: Once | INTRAVENOUS | Status: AC
  Administered 2011-12-08: 11:00:00 via INTRAVENOUS

## 2011-12-08 NOTE — ED Notes (Signed)
Patient transported to X-ray 

## 2011-12-08 NOTE — ED Notes (Addendum)
Pt reports vomiting since Saturday. Generalized abdominal pain. Denies fever/chills. No diarrhea. No urinary difficulties. No BM in 5 days. Pt had kidney transplant in 2005.

## 2011-12-08 NOTE — ED Provider Notes (Signed)
History     CSN: [id number]  Arrival date & time 12/08/11  [id number]   First MD Initiated Contact with Patient 12/08/11 [phone]      Chief Complaint  Patient presents with  . Abdominal Pain  . Emesis    (Consider location/radiation/quality/duration/timing/severity/associated sxs/prior treatment) HPI  Pt to the ER with complaints of abdominal pain with worsening  pain since last night. In the ER today her pain is severe. She has had nausea and vomiting with it, no diarrhea but 5 days of constipation. Her pain is diffused and sharp in nature. She has had three kidney transplants, the latest one being over 3 years ago. She is afebrile, tachycardic. Unable to take complete HPI due to severe pain.  Past Medical History  Diagnosis Date  . Gastroparesis   . Hx of kidney transplant   . Gout   . Hypotension   . Herpes genitalia   . Stroke      left basal ganglia lacunar infarct; Right frontal lobe lacunar infarct.  [name] ESRD (end stage renal disease) 06/12/2011  . E coli bacteremia 06/18/2011  . Bacteremia due to Gram-negative bacteria 05/23/2011    Past Surgical History  Procedure Date  . Kidney transplant     X 3  . Appendectomy   . Eye surgery     enuculeation with prothesisi    Family History  Problem Relation Age of Onset  . Hypertension Maternal Grandmother     History  Substance Use Topics  . Smoking status: Former Smoker -- 0.5 packs/day    Types: Cigarettes  . Smokeless tobacco: Never Used  . Alcohol Use: Yes     Comment: rare    OB History    Grav Para Term Preterm Abortions TAB SAB Ect Mult Living                  Review of Systems Unable to take ROS as pt is in severe pain  Allergies  Levofloxacin  Home Medications   Current Outpatient Rx  Name  Route  Sig  Dispense  Refill  . ALPRAZOLAM 0.25 MG PO TABS   Oral   Take 0.25 mg by mouth 2 (two) times daily as needed. For anxiety         . ASPIRIN EC 81 MG PO TBEC   Oral   Take 81 mg by mouth daily.         . AZATHIOPRINE 50 MG PO TABS   Oral   Take 100 mg by mouth daily.         [name] CALCITRIOL 0.25 MCG PO CAPS   Oral   Take 0.25 mcg by mouth daily.         [name] CALCIUM CARBONATE ANTACID 750 MG PO CHEW   Oral   Chew 1-2 tablets by mouth as needed. For stomach         . COLCHICINE 0.6 MG PO TABS   Oral   Take 0.6 mg by mouth daily.         . EPOETIN ALFA 16109 UNIT/ML IJ SOLN   Subcutaneous   Inject 40,000 Units into the skin every 30 (thirty) days.          . FUROSEMIDE 40 MG PO TABS   Oral   Take 1 tablet (40 mg total) by mouth 2 (two) times daily as needed.   30 tablet   0   . METOCLOPRAMIDE HCL 5 MG PO TABS   Oral   Take 5  mg by mouth at bedtime.         [name] MIDODRINE HCL 10 MG PO TABS   Oral   Take 10 mg by mouth 2 (two) times daily.         . ADULT MULTIVITAMIN W/MINERALS CH   Oral   Take 1 tablet by mouth daily.         [name] OMEPRAZOLE 40 MG PO CPDR   Oral   Take 40 mg by mouth 2 (two) times daily.          . OXYCODONE HCL 10 MG PO TABS   Oral   Take 10 mg by mouth 4 (four) times daily as needed. For pain         . OXYCODONE-ACETAMINOPHEN 10-650 MG PO TABS   Oral   Take 1 tablet by mouth every 6 (six) hours as needed. For pain.         [name] PREDNISONE 5 MG PO TABS      Take 15 mg for 5 days. Then 10 mg for 5 days. Then 5 mg daily thereafter.   30 tablet   0   . PROMETHAZINE HCL 25 MG PO TABS   Oral   Take 25 mg by mouth every 6 (six) hours as needed. For nausea          . ROSUVASTATIN CALCIUM 5 MG PO TABS   Oral   Take 5 mg by mouth daily.         [name] TACROLIMUS 1 MG PO CAPS   Oral   Take 1 capsule (1 mg total) by mouth 2 (two) times daily.   30 capsule   0   . VALACYCLOVIR HCL 500 MG PO TABS   Oral   Take 1 tablet (500 mg total) by mouth daily.   30 tablet   1   . ZOLPIDEM TARTRATE 10 MG PO TABS   Oral   Take 10 mg by mouth at bedtime as needed. To help sleep.           BP 116/69  Pulse 82  Temp 98.4 F  (36.9 C) (Oral)  Resp 20  Ht 4\' 11"  (1.499 m)  SpO2 100%  Physical Exam  Nursing note and vitals reviewed. Constitutional: She appears well-developed and well-nourished. No distress.  HENT:  Head: Normocephalic and atraumatic.  Eyes: Pupils are equal, round, and reactive to light.  Neck: Normal range of motion. Neck supple.  Cardiovascular: Normal rate and regular rhythm.   Pulmonary/Chest: Effort normal.  Abdominal: Soft. She exhibits distension. She exhibits no mass. There is tenderness (diffuse moderate tenderness to palpation). There is guarding (mild guarding). There is no rebound.  Neurological: She is alert.  Skin: Skin is warm and dry.    ED Course  Procedures (including critical care time)   Labs Reviewed  PREGNANCY, URINE  URINALYSIS, ROUTINE W REFLEX MICROSCOPIC  COMPREHENSIVE METABOLIC PANEL  CBC WITH DIFFERENTIAL   Dg Abd 1 View  12/08/2011  *RADIOLOGY REPORT*  Clinical Data: Abdominal pain, constipation, vomiting  ABDOMEN - 1 VIEW  Comparison: None.  Findings: There is nonspecific nonobstructive bowel gas pattern. Surgical clips are noted midline upper abdomen.  Surgical clips are noted bilateral pelvis.  Multiple pelvic phleboliths.  Moderate stool noted in transverse colon.  IMPRESSION: Nonspecific nonobstructive bowel gas pattern.  Postsurgical changes upper abdomen and within pelvis.  Moderate stool and transverse colon.   Original Report Authenticated By: Lahoma Crocker, M.D.      No diagnosis  found.    MDM  10:45am IV team paged to obtain IV. Pt given 2mg  Dilaudid IM and 4mg  IM Zofran for pain and nausea. She is now completely pain free. Plan films are negative. CT scan abd/pelv with ORAL contrast ONLY ordered. Pt currently stable.   10:56 Patient CT with oral contrast only ordered. No labs have resulted yet. Pt 100% pain free at this time. I have discussed case with AKenton Kingfisher PA-C who has agreed to assume care of this patient and will follow-up on labs and  CT scan.     Linus Mako, [place] 12/08/11 1057

## 2011-12-08 NOTE — ED Provider Notes (Signed)
Medical screening examination/treatment/procedure(s) were performed by non-physician practitioner and as supervising physician I was immediately available for consultation/collaboration.   Nimrit Kehres B. Karle Starch, MD 12/08/11 1357

## 2011-12-08 NOTE — ED Notes (Signed)
IV team made aware of need for IV start. At least a 20g in preparation of CT abd

## 2011-12-08 NOTE — ED Notes (Signed)
IV attempt x 1 to pt's right forearm. Unsuccessful

## 2011-12-08 NOTE — ED Provider Notes (Signed)
11:14 AM Assumed care of patient in CDU from [place]. Cc Abdominal pain and emesis. PMH sig for 3 renal transplants. Pain is controlled with 2mg  dilaudid and zofran. Awaiting abdominal CT with oral contrast only.  BP 95/59  Pulse 63  Temp 98.4 F (36.9 C) (Oral)  Resp 18  Ht 4\' 11"  (1.499 m)  SpO2 100% Patient with hypokalemia.  BUN/CR slightly elevated form baseline.  Hypotensive.  I have asked the nurse to increase flow rate on her IV.  PO potassium and awaiting POCT preg for for abdominal CT.    1:35 PM BP 99/60  Pulse 80  Temp 98.4 F (36.9 C) (Oral)  Resp 20  Ht 4\' 11"  (1.499 m)  SpO2 99% CT results  IMPRESSION: No evidence of bowel obstruction. 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 Thick-walled bladder, correlate for cystitis. Original Report Authenticated By: Julian Hy, M.D.  2:30 PM I spoke with urology who states that CT scan looks negative for any pathology of the bladder.Discharge the patient with multiple enema and MiraLax.  Patient may followup with her primary care physician if she has any symptoms of urinary tract infection.     Margarita Mail, PA-C 12/08/11 2008

## 2011-12-10 ENCOUNTER — Emergency Department (HOSPITAL_COMMUNITY): Payer: Medicaid Other

## 2011-12-10 ENCOUNTER — Encounter (HOSPITAL_COMMUNITY): Payer: Self-pay | Admitting: Emergency Medicine

## 2011-12-10 ENCOUNTER — Inpatient Hospital Stay (HOSPITAL_COMMUNITY)
Admission: EM | Admit: 2011-12-10 | Discharge: 2011-12-12 | DRG: 391 | Disposition: A | Discharge status: Home / Self Care | Payer: Medicaid Other | Attending: Internal Medicine | Admitting: Internal Medicine

## 2011-12-10 DIAGNOSIS — R7881 Bacteremia: Secondary | ICD-10-CM

## 2011-12-10 DIAGNOSIS — Z8673 Personal history of transient ischemic attack (TIA), and cerebral infarction without residual deficits: Secondary | ICD-10-CM

## 2011-12-10 DIAGNOSIS — N179 Acute kidney failure, unspecified: Secondary | ICD-10-CM | POA: Diagnosis present

## 2011-12-10 DIAGNOSIS — F329 Major depressive disorder, single episode, unspecified: Secondary | ICD-10-CM | POA: Diagnosis present

## 2011-12-10 DIAGNOSIS — Z881 Allergy status to other antibiotic agents status: Secondary | ICD-10-CM

## 2011-12-10 DIAGNOSIS — B9620 Unspecified Escherichia coli [E. coli] as the cause of diseases classified elsewhere: Secondary | ICD-10-CM

## 2011-12-10 DIAGNOSIS — K3184 Gastroparesis: Principal | ICD-10-CM | POA: Diagnosis present

## 2011-12-10 DIAGNOSIS — D631 Anemia in chronic kidney disease: Secondary | ICD-10-CM | POA: Diagnosis present

## 2011-12-10 DIAGNOSIS — A498 Other bacterial infections of unspecified site: Secondary | ICD-10-CM

## 2011-12-10 DIAGNOSIS — Z9089 Acquired absence of other organs: Secondary | ICD-10-CM

## 2011-12-10 DIAGNOSIS — IMO0002 Reserved for concepts with insufficient information to code with codable children: Secondary | ICD-10-CM

## 2011-12-10 DIAGNOSIS — R112 Nausea with vomiting, unspecified: Secondary | ICD-10-CM | POA: Diagnosis present

## 2011-12-10 DIAGNOSIS — F411 Generalized anxiety disorder: Secondary | ICD-10-CM | POA: Diagnosis present

## 2011-12-10 DIAGNOSIS — R6521 Severe sepsis with septic shock: Secondary | ICD-10-CM | POA: Diagnosis present

## 2011-12-10 DIAGNOSIS — B9689 Other specified bacterial agents as the cause of diseases classified elsewhere: Secondary | ICD-10-CM

## 2011-12-10 DIAGNOSIS — N039 Chronic nephritic syndrome with unspecified morphologic changes: Secondary | ICD-10-CM | POA: Diagnosis present

## 2011-12-10 DIAGNOSIS — K219 Gastro-esophageal reflux disease without esophagitis: Secondary | ICD-10-CM | POA: Diagnosis present

## 2011-12-10 DIAGNOSIS — Z79899 Other long term (current) drug therapy: Secondary | ICD-10-CM

## 2011-12-10 DIAGNOSIS — N189 Chronic kidney disease, unspecified: Secondary | ICD-10-CM

## 2011-12-10 DIAGNOSIS — Z87891 Personal history of nicotine dependence: Secondary | ICD-10-CM

## 2011-12-10 DIAGNOSIS — A419 Sepsis, unspecified organism: Secondary | ICD-10-CM | POA: Diagnosis present

## 2011-12-10 DIAGNOSIS — Z940 Kidney transplant status: Secondary | ICD-10-CM

## 2011-12-10 DIAGNOSIS — R1110 Vomiting, unspecified: Secondary | ICD-10-CM

## 2011-12-10 DIAGNOSIS — I120 Hypertensive chronic kidney disease with stage 5 chronic kidney disease or end stage renal disease: Secondary | ICD-10-CM | POA: Diagnosis present

## 2011-12-10 DIAGNOSIS — M545 Low back pain, unspecified: Secondary | ICD-10-CM | POA: Diagnosis present

## 2011-12-10 DIAGNOSIS — F3289 Other specified depressive episodes: Secondary | ICD-10-CM | POA: Diagnosis present

## 2011-12-10 DIAGNOSIS — M899 Disorder of bone, unspecified: Secondary | ICD-10-CM | POA: Diagnosis present

## 2011-12-10 DIAGNOSIS — M109 Gout, unspecified: Secondary | ICD-10-CM | POA: Diagnosis present

## 2011-12-10 DIAGNOSIS — E860 Dehydration: Secondary | ICD-10-CM | POA: Diagnosis present

## 2011-12-10 DIAGNOSIS — M949 Disorder of cartilage, unspecified: Secondary | ICD-10-CM | POA: Diagnosis present

## 2011-12-10 DIAGNOSIS — N186 End stage renal disease: Secondary | ICD-10-CM | POA: Diagnosis present

## 2011-12-10 DIAGNOSIS — Z7982 Long term (current) use of aspirin: Secondary | ICD-10-CM

## 2011-12-10 DIAGNOSIS — G8929 Other chronic pain: Secondary | ICD-10-CM | POA: Diagnosis present

## 2011-12-10 DIAGNOSIS — F419 Anxiety disorder, unspecified: Secondary | ICD-10-CM | POA: Diagnosis present

## 2011-12-10 HISTORY — DX: Personal history of other medical treatment: Z92.89

## 2011-12-10 HISTORY — DX: Cardiac arrhythmia, unspecified: I49.9

## 2011-12-10 HISTORY — DX: Other chronic pain: G89.29

## 2011-12-10 HISTORY — DX: Other specified disorders of bone density and structure, unspecified site: M85.80

## 2011-12-10 HISTORY — DX: Other complications of anesthesia, initial encounter: T88.59XA

## 2011-12-10 HISTORY — DX: Major depressive disorder, single episode, unspecified: F32.9

## 2011-12-10 HISTORY — DX: Headache: R51

## 2011-12-10 HISTORY — DX: Personal history of other diseases of the digestive system: Z87.19

## 2011-12-10 HISTORY — DX: Personal history of peptic ulcer disease: Z87.11

## 2011-12-10 HISTORY — DX: Low back pain, unspecified: M54.50

## 2011-12-10 HISTORY — DX: Depression, unspecified: F32.A

## 2011-12-10 HISTORY — DX: Adverse effect of unspecified anesthetic, initial encounter: T41.45XA

## 2011-12-10 HISTORY — DX: Unspecified convulsions: R56.9

## 2011-12-10 HISTORY — DX: Low back pain: M54.5

## 2011-12-10 HISTORY — DX: Gastro-esophageal reflux disease without esophagitis: K21.9

## 2011-12-10 HISTORY — DX: Iron deficiency anemia, unspecified: D50.9

## 2011-12-10 LAB — CBC WITH DIFFERENTIAL/PLATELET
Basophils Absolute: 0 10*3/uL (ref 0.0–0.1)
Basophils Relative: 0 % (ref 0–1)
Eosinophils Absolute: 0 10*3/uL (ref 0.0–0.7)
Eosinophils Relative: 1 % (ref 0–5)
HCT: 33 % — ABNORMAL LOW (ref 36.0–46.0)
Hemoglobin: 11.6 g/dL — ABNORMAL LOW (ref 12.0–15.0)
Lymphocytes Relative: 14 % (ref 12–46)
Lymphs Abs: 0.8 10*3/uL (ref 0.7–4.0)
MCH: 30.4 pg (ref 26.0–34.0)
MCHC: 35.2 g/dL (ref 30.0–36.0)
MCV: 86.6 fL (ref 78.0–100.0)
Monocytes Absolute: 0.5 10*3/uL (ref 0.1–1.0)
Monocytes Relative: 9 % (ref 3–12)
Neutro Abs: 4.3 10*3/uL (ref 1.7–7.7)
Neutrophils Relative %: 77 % (ref 43–77)
Platelets: 190 10*3/uL (ref 150–400)
RBC: 3.81 MIL/uL — ABNORMAL LOW (ref 3.87–5.11)
RDW: 18.2 % — ABNORMAL HIGH (ref 11.5–15.5)
WBC: 5.6 10*3/uL (ref 4.0–10.5)

## 2011-12-10 LAB — URINALYSIS, ROUTINE W REFLEX MICROSCOPIC
Glucose, UA: NEGATIVE mg/dL
Hgb urine dipstick: NEGATIVE
Ketones, ur: 15 mg/dL — AB
Nitrite: NEGATIVE
Protein, ur: 30 mg/dL — AB
Specific Gravity, Urine: 1.016 (ref 1.005–1.030)
Urobilinogen, UA: 0.2 mg/dL (ref 0.0–1.0)
pH: 5.5 (ref 5.0–8.0)

## 2011-12-10 LAB — URINE MICROSCOPIC-ADD ON

## 2011-12-10 LAB — HEPATIC FUNCTION PANEL
ALT: 9 U/L (ref 0–35)
AST: 23 U/L (ref 0–37)
Albumin: 4.6 g/dL (ref 3.5–5.2)
Alkaline Phosphatase: 72 U/L (ref 39–117)
Bilirubin, Direct: 0.1 mg/dL (ref 0.0–0.3)
Indirect Bilirubin: 0.5 mg/dL (ref 0.3–0.9)
Total Bilirubin: 0.6 mg/dL (ref 0.3–1.2)
Total Protein: 7.6 g/dL (ref 6.0–8.3)

## 2011-12-10 LAB — BASIC METABOLIC PANEL
BUN: 35 mg/dL — ABNORMAL HIGH (ref 6–23)
CO2: 21 mEq/L (ref 19–32)
Calcium: 10.8 mg/dL — ABNORMAL HIGH (ref 8.4–10.5)
Chloride: 97 mEq/L (ref 96–112)
Creatinine, Ser: 3.61 mg/dL — ABNORMAL HIGH (ref 0.50–1.10)
GFR calc Af Amer: 17 mL/min — ABNORMAL LOW (ref >90)
GFR calc non Af Amer: 14 mL/min — ABNORMAL LOW (ref >90)
Glucose, Bld: 90 mg/dL (ref 70–99)
Potassium: 3.3 mEq/L — ABNORMAL LOW (ref 3.5–5.1)
Sodium: 136 mEq/L (ref 135–145)

## 2011-12-10 LAB — CREATININE, URINE, RANDOM: Creatinine, Urine: 227.24 mg/dL

## 2011-12-10 LAB — LIPASE, BLOOD: Lipase: 35 U/L (ref 11–59)

## 2011-12-10 LAB — SODIUM, URINE, RANDOM: Sodium, Ur: 33 mEq/L

## 2011-12-10 MED ORDER — MIDODRINE HCL 5 MG PO TABS
10.0000 mg | ORAL_TABLET | Freq: Two times a day (BID) | ORAL | Status: DC
  Administered 2011-12-11 (×2): 10 mg via ORAL
  Filled 2011-12-10 (×6): qty 2

## 2011-12-10 MED ORDER — TACROLIMUS 1 MG PO CAPS
2.0000 mg | ORAL_CAPSULE | Freq: Every day | ORAL | Status: DC
  Administered 2011-12-11 (×2): 2 mg via ORAL
  Filled 2011-12-10 (×3): qty 2

## 2011-12-10 MED ORDER — CALCITRIOL 0.25 MCG PO CAPS
0.2500 ug | ORAL_CAPSULE | Freq: Every day | ORAL | Status: DC
  Administered 2011-12-11: 0.25 ug via ORAL
  Filled 2011-12-10 (×2): qty 1

## 2011-12-10 MED ORDER — ALPRAZOLAM 0.25 MG PO TABS: 0.2500 mg | ORAL_TABLET | Freq: Two times a day (BID) | ORAL | Status: DC | PRN

## 2011-12-10 MED ORDER — ADULT MULTIVITAMIN W/MINERALS CH
1.0000 | ORAL_TABLET | Freq: Every day | ORAL | Status: DC
  Administered 2011-12-11: 1 via ORAL
  Filled 2011-12-10 (×2): qty 1

## 2011-12-10 MED ORDER — HYDROCODONE-ACETAMINOPHEN 5-325 MG PO TABS
1.0000 | ORAL_TABLET | ORAL | Status: DC | PRN
  Filled 2011-12-10 (×3): qty 2

## 2011-12-10 MED ORDER — PROMETHAZINE HCL 25 MG PO TABS: 25.0000 mg | ORAL_TABLET | Freq: Four times a day (QID) | ORAL | Status: DC | PRN

## 2011-12-10 MED ORDER — AZATHIOPRINE 50 MG PO TABS
100.0000 mg | ORAL_TABLET | Freq: Every day | ORAL | Status: DC
  Administered 2011-12-11: 100 mg via ORAL
  Filled 2011-12-10 (×2): qty 2

## 2011-12-10 MED ORDER — PANTOPRAZOLE SODIUM 40 MG PO TBEC
40.0000 mg | DELAYED_RELEASE_TABLET | Freq: Every day | ORAL | Status: DC
  Administered 2011-12-11: 40 mg via ORAL
  Filled 2011-12-10 (×3): qty 1

## 2011-12-10 MED ORDER — SODIUM CHLORIDE 0.9 % IV BOLUS (SEPSIS)
1000.0000 mL | Freq: Once | INTRAVENOUS | Status: AC
  Administered 2011-12-10: 1000 mL via INTRAVENOUS

## 2011-12-10 MED ORDER — ONDANSETRON HCL 4 MG/2ML IJ SOLN: 4.0000 mg | Freq: Three times a day (TID) | INTRAMUSCULAR | Status: AC | PRN

## 2011-12-10 MED ORDER — POTASSIUM CHLORIDE CRYS ER 20 MEQ PO TBCR
20.0000 meq | EXTENDED_RELEASE_TABLET | Freq: Once | ORAL | Status: AC
  Administered 2011-12-10: 20 meq via ORAL
  Filled 2011-12-10 (×2): qty 1

## 2011-12-10 MED ORDER — MINERAL OIL RE ENEM: 1.0000 | ENEMA | Freq: Once | RECTAL | Status: DC

## 2011-12-10 MED ORDER — MORPHINE SULFATE 2 MG/ML IJ SOLN
1.0000 mg | INTRAMUSCULAR | Status: DC | PRN
  Administered 2011-12-11: 1 mg via INTRAVENOUS
  Filled 2011-12-10: qty 1

## 2011-12-10 MED ORDER — HYDROMORPHONE HCL PF 1 MG/ML IJ SOLN
1.0000 mg | INTRAMUSCULAR | Status: AC | PRN
  Administered 2011-12-11: 1 mg via INTRAVENOUS
  Filled 2011-12-10: qty 1

## 2011-12-10 MED ORDER — SODIUM CHLORIDE 0.9 % IV SOLN
INTRAVENOUS | Status: AC
  Administered 2011-12-11: 01:00:00 via INTRAVENOUS

## 2011-12-10 MED ORDER — PREDNISONE 5 MG PO TABS
5.0000 mg | ORAL_TABLET | Freq: Every day | ORAL | Status: DC
  Administered 2011-12-11: 5 mg via ORAL
  Filled 2011-12-10 (×2): qty 1

## 2011-12-10 MED ORDER — SODIUM CHLORIDE 0.9 % IV SOLN
Freq: Once | INTRAVENOUS | Status: AC
  Administered 2011-12-10: 18:00:00 via INTRAVENOUS

## 2011-12-10 MED ORDER — SODIUM CHLORIDE 0.9 % IV BOLUS (SEPSIS)
500.0000 mL | Freq: Once | INTRAVENOUS | Status: AC
  Administered 2011-12-10: 500 mL via INTRAVENOUS

## 2011-12-10 MED ORDER — TACROLIMUS 1 MG PO CAPS
3.0000 mg | ORAL_CAPSULE | Freq: Every day | ORAL | Status: DC
  Administered 2011-12-11: 3 mg via ORAL
  Filled 2011-12-10 (×2): qty 3

## 2011-12-10 MED ORDER — MIDODRINE HCL 5 MG PO TABS: 10.0000 mg | ORAL_TABLET | Freq: Two times a day (BID) | ORAL | Status: DC

## 2011-12-10 MED ORDER — HYDROMORPHONE HCL PF 1 MG/ML IJ SOLN
1.0000 mg | Freq: Once | INTRAMUSCULAR | Status: AC
  Administered 2011-12-10: 1 mg via INTRAVENOUS
  Filled 2011-12-10: qty 1

## 2011-12-10 MED ORDER — ONDANSETRON HCL 4 MG/2ML IJ SOLN
4.0000 mg | Freq: Four times a day (QID) | INTRAMUSCULAR | Status: DC | PRN
  Administered 2011-12-11: 4 mg via INTRAVENOUS
  Filled 2011-12-10 (×2): qty 2

## 2011-12-10 MED ORDER — ALBUTEROL SULFATE (5 MG/ML) 0.5% IN NEBU: 2.5000 mg | INHALATION_SOLUTION | RESPIRATORY_TRACT | Status: DC | PRN

## 2011-12-10 MED ORDER — ASPIRIN EC 81 MG PO TBEC
81.0000 mg | DELAYED_RELEASE_TABLET | Freq: Every day | ORAL | Status: DC
  Administered 2011-12-11: 81 mg via ORAL
  Filled 2011-12-10 (×2): qty 1

## 2011-12-10 MED ORDER — GUAIFENESIN-DM 100-10 MG/5ML PO SYRP
5.0000 mL | ORAL_SOLUTION | ORAL | Status: DC | PRN
  Filled 2011-12-10: qty 5

## 2011-12-10 MED ORDER — MORPHINE SULFATE 4 MG/ML IJ SOLN
4.0000 mg | Freq: Once | INTRAMUSCULAR | Status: AC
  Administered 2011-12-10: 4 mg via INTRAVENOUS
  Filled 2011-12-10: qty 1

## 2011-12-10 MED ORDER — HEPARIN SODIUM (PORCINE) 5000 UNIT/ML IJ SOLN
5000.0000 [IU] | Freq: Three times a day (TID) | INTRAMUSCULAR | Status: DC
  Administered 2011-12-11 – 2011-12-12 (×5): 5000 [IU] via SUBCUTANEOUS
  Filled 2011-12-10 (×8): qty 1

## 2011-12-10 MED ORDER — METOCLOPRAMIDE HCL 5 MG PO TABS
5.0000 mg | ORAL_TABLET | Freq: Every day | ORAL | Status: DC
  Administered 2011-12-11 (×2): 5 mg via ORAL
  Filled 2011-12-10 (×3): qty 1

## 2011-12-10 MED ORDER — ONDANSETRON HCL 4 MG/2ML IJ SOLN
4.0000 mg | Freq: Once | INTRAMUSCULAR | Status: AC
  Administered 2011-12-10: 4 mg via INTRAVENOUS
  Filled 2011-12-10: qty 2

## 2011-12-10 NOTE — ED Provider Notes (Signed)
History     CSN: [id number]  Arrival date & time 12/10/11  1241   First MD Initiated Contact with Patient 12/10/11 1410      Chief Complaint  Patient presents with  . Emesis  . Abdominal Pain    (Consider location/radiation/quality/duration/timing/severity/associated sxs/prior treatment) HPI  Tina Watkins is a 46 y.o. female complaining of severe diffuse abdominal pain and nausea and vomiting. Patient was seen for similar yesterday. Past medical history significant for multiple renal transplants,  last one was successful and she has not required dialysis in 8 years. Patient denies fever, chest pain, shortness of breath. She has been constipated and had a bowel movement yesterday that was harder than normal. She denies any urinary symptoms.   Past Medical History  Diagnosis Date  . Gastroparesis   . Hx of kidney transplant   . Gout   . Hypotension   . Herpes genitalia   . Stroke      left basal ganglia lacunar infarct; Right frontal lobe lacunar infarct.  [name] ESRD (end stage renal disease) 06/12/2011  . E coli bacteremia 06/18/2011  . Bacteremia due to Gram-negative bacteria 05/23/2011  . Anemia     Past Surgical History  Procedure Date  . Kidney transplant     X 3  . Appendectomy   . Eye surgery     enuculeation with prothesisi    Family History  Problem Relation Age of Onset  . Hypertension Maternal Grandmother     History  Substance Use Topics  . Smoking status: Former Smoker -- 0.5 packs/day    Types: Cigarettes  . Smokeless tobacco: Never Used  . Alcohol Use: Yes     Comment: rare    OB History    Grav Para Term Preterm Abortions TAB SAB Ect Mult Living                  Review of Systems  Constitutional: Negative for fever.  Respiratory: Negative for shortness of breath.   Cardiovascular: Negative for chest pain.  Gastrointestinal: Positive for nausea, vomiting, abdominal pain and constipation. Negative for diarrhea.  All other systems  reviewed and are negative.    Allergies  Levofloxacin  Home Medications   Current Outpatient Rx  Name  Route  Sig  Dispense  Refill  . ALPRAZOLAM 0.25 MG PO TABS   Oral   Take 0.25 mg by mouth 2 (two) times daily as needed. For anxiety         . ASPIRIN EC 81 MG PO TBEC   Oral   Take 81 mg by mouth daily.         . AZATHIOPRINE 50 MG PO TABS   Oral   Take 100 mg by mouth daily.         [name] CALCITRIOL 0.25 MCG PO CAPS   Oral   Take 0.25 mcg by mouth daily.         . COLCHICINE 0.6 MG PO TABS   Oral   Take 0.6 mg by mouth daily as needed. gout         . EPOETIN ALFA 24401 UNIT/ML IJ SOLN   Subcutaneous   Inject 40,000 Units into the skin every 30 (thirty) days. Takes injection the 1st Friday of every month         . FUROSEMIDE 40 MG PO TABS   Oral   Take 40 mg by mouth 2 (two) times daily as needed. Takes 40 mg daily and an additional 40  mg later in day if needed for swelling         . METOCLOPRAMIDE HCL 5 MG PO TABS   Oral   Take 5 mg by mouth at bedtime.         [name] MIDODRINE HCL 10 MG PO TABS   Oral   Take 10 mg by mouth 2 (two) times daily.         [name] MINERAL OIL RE ENEM   Rectal   Place 1 enema rectally once.         . ADULT MULTIVITAMIN W/MINERALS CH   Oral   Take 1 tablet by mouth daily.         [name] OMEPRAZOLE 40 MG PO CPDR   Oral   Take 40 mg by mouth 2 (two) times daily.          . OXYCODONE HCL 10 MG PO TABS   Oral   Take 10 mg by mouth 4 (four) times daily as needed. For pain         . OXYCODONE-ACETAMINOPHEN 10-650 MG PO TABS   Oral   Take 1 tablet by mouth every 6 (six) hours as needed. For pain.         [name] PREDNISONE 5 MG PO TABS   Oral   Take 5 mg by mouth daily.         [name] PROMETHAZINE HCL 25 MG PO TABS   Oral   Take 25 mg by mouth every 6 (six) hours as needed. For nausea          . TACROLIMUS 1 MG PO CAPS   Oral   Take 2-3 mg by mouth 2 (two) times daily. Takes 3 mg in the morning and 2 mg at night            BP 134/84  Pulse 84  Temp 97.8 F (36.6 C) (Oral)  Resp 18  SpO2 96%  Physical Exam  Nursing note and vitals reviewed. Constitutional: She is oriented to person, place, and time. She appears well-developed and well-nourished. No distress.       Appears acutely agitated.  HENT:  Head: Normocephalic and atraumatic.  Mouth/Throat: Oropharynx is clear and moist.  Eyes: Conjunctivae normal and EOM are normal. Pupils are equal, round, and reactive to light.  Cardiovascular: Normal rate and regular rhythm.   Pulmonary/Chest: Effort normal and breath sounds normal. No stridor. No respiratory distress. She has no wheezes. She has no rales. She exhibits no tenderness.  Abdominal: Soft. Bowel sounds are normal. She exhibits no distension and no mass. There is tenderness. There is no rebound and no guarding.       Diffusely tender to palpation  Musculoskeletal: Normal range of motion.  Neurological: She is alert and oriented to person, place, and time.  Psychiatric: She has a normal mood and affect.    ED Course  Procedures (including critical care time)  Labs Reviewed  CBC WITH DIFFERENTIAL - Abnormal; Notable for the following:    RBC 3.81 (*)     Hemoglobin 11.6 (*)     HCT 33.0 (*)     RDW 18.2 (*)     All other components within normal limits  BASIC METABOLIC PANEL - Abnormal; Notable for the following:    Potassium 3.3 (*)     BUN 35 (*)     Creatinine, Ser 3.61 (*)     Calcium 10.8 (*)     GFR calc non Af Amer 14 (*)  GFR calc Af Amer 17 (*)     All other components within normal limits  URINALYSIS, ROUTINE W REFLEX MICROSCOPIC - Abnormal; Notable for the following:    Bilirubin Urine SMALL (*)     Ketones, ur 15 (*)     Protein, ur 30 (*)     Leukocytes, UA SMALL (*)     All other components within normal limits  URINE MICROSCOPIC-ADD ON - Abnormal; Notable for the following:    Squamous Epithelial / LPF FEW (*)     Bacteria, UA FEW (*)     Casts HYALINE  CASTS (*)     All other components within normal limits  LIPASE, BLOOD  HEPATIC FUNCTION PANEL  URINE CULTURE  CREATININE, URINE, RANDOM  SODIUM, URINE, RANDOM   Dg Abd Acute W/chest  12/10/2011  *RADIOLOGY REPORT*  Clinical Data: Abdominal pain, vomiting  ACUTE ABDOMEN SERIES (ABDOMEN 2 VIEW & CHEST 1 VIEW)  Comparison: 12/08/2011 and 05/23/2011  Findings: Cardiomediastinal silhouette is stable.  Stable stent in SVC region.  No acute infiltrate or pleural effusion.  There is nonspecific nonobstructive bowel gas pattern.  Stable postsurgical changes upper abdomen and within pelvis.  No free abdominal air.  IMPRESSION: No acute disease.  No significant change.   Original Report Authenticated By: Lahoma Crocker, M.D.      1. ARF (acute renal failure)   2. Vomiting       MDM  Patient had normal acute abdominal series and CT scan  (no contrast ) yesterday.  Patient's creatinine is 3.69 today this is up from 2.6 yesterday possibly due to dye load of abdominal CT yesterday.  5:15 PM patient's pain is significantly reduced. Serial abdominal exams remained benign with no peritoneal sign and minor diffuse tenderness to palpation which is significantly improved. Patient will be given a by mouth challenge.  Consult from nephrologist Dr. Mercy Moore appreciated: He recommends to hold Lasix and hydrate her.  Considering the importance of proper keep renal function in the single transplanted functioning  kidney that she has and the significant increase in creatinine over the course last 24 hours think is reasonable to admit her for observation, hydration and recheck of renal function.   Have informed nephrologist Dr. Mercy Moore that the patient will be admitted and his team will round on the patient tomorrow.  Monico Blitz, PA-C 12/10/11 1831

## 2011-12-10 NOTE — ED Provider Notes (Signed)
Medical screening examination/treatment/procedure(s) were performed by non-physician practitioner and as supervising physician I was immediately available for consultation/collaboration.   Hollace Michelli B. Karle Starch, MD 12/10/11 1014

## 2011-12-10 NOTE — ED Provider Notes (Signed)
History/physical exam/procedure(s) were performed by non-physician practitioner and as supervising physician I was immediately available for consultation/collaboration. I have reviewed all notes and am in agreement with care and plan.   Shaune Pollack, MD 12/10/11 [phone]

## 2011-12-10 NOTE — ED Notes (Signed)
Pt c/o abd pain and N/V; pt seen here for same yesterday; pt with hx of renal transplant

## 2011-12-10 NOTE — H&P (Signed)
[facility]                                                                                    Patient Demographics  Tina Watkins, is a 46 y.o. female  CSN: [id number]  MRN: [id number]  DOB - 01-29-1966  Admit Date - 12/10/2011  Outpatient Primary MD for the patient is [name], MD   With History of -  Past Medical History  Diagnosis Date  . Gastroparesis   . Hx of kidney transplant   . Gout   . Hypotension   . Herpes genitalia   . Stroke      left basal ganglia lacunar infarct; Right frontal lobe lacunar infarct.  [name] ESRD (end stage renal disease) 06/12/2011  . E coli bacteremia 06/18/2011  . Bacteremia due to Gram-negative bacteria 05/23/2011  . Anemia       Past Surgical History  Procedure Date  . Kidney transplant     X 3  . Appendectomy   . Eye surgery     enuculeation with prothesisi    in for   Chief Complaint  Patient presents with  . Emesis  . Abdominal Pain     HPI  Tina Watkins  is a 46 y.o. female, with H/O Renal Transplant, Gateroperesis, Gout, who has been having N&V for the last 1 week or so, she initially came to the ER yesterday where she had a CT abdomen pelvis which was essentially unremarkable except for some bladder thickening, she said that she had been constipated but yesterday in the ER she received an enema with good results, however she continued to have some nausea vomiting along with generalized abdominal discomfort due to excessive vomiting for which she repairs and it herself to the ER today, in the ER today after receiving supportive care she felt a lot better and was able to keep her diet down, however on lab work she showed some acute renal insufficiency due to dehydration, case was discussed by the ER physician with Dr. Mercy Moore nephrologist who requested that patient be admitted to the hospital for further monitoring of her renal function and hydration.    Review of Systems    In addition to the  HPI above,  No Fever-chills, No Headache, No changes with Vision or hearing, No problems swallowing food or Liquids, No Chest pain, Cough or Shortness of Breath, Was generalized Abdominal pain, positive Nausea or Vommitting, some constipation No Blood in stool or Urine, No dysuria, No new skin rashes or bruises, No new joints pains-aches,  No new weakness, tingling, numbness in any extremity, No recent weight gain or loss, No polyuria, polydypsia or polyphagia, No significant Mental Stressors.   A full 10 point Review of Systems was done, except as stated above, all other Review of Systems were negative.    Social History History  Substance Use Topics  . Smoking status: Former Smoker -- 0.5 packs/day    Types: Cigarettes  . Smokeless tobacco: Never Used  . Alcohol Use: Yes     Comment: rare     Family History Family History  Problem Relation Age of Onset  . Hypertension Maternal Grandmother  Prior to Admission medications   Medication Sig Start Date End Date Taking? Authorizing Provider  ALPRAZolam (XANAX) 0.25 MG tablet Take 0.25 mg by mouth 2 (two) times daily as needed. For anxiety   Yes Historical Provider, MD  aspirin EC 81 MG tablet Take 81 mg by mouth daily.   Yes Historical Provider, MD  azaTHIOprine (IMURAN) 50 MG tablet Take 100 mg by mouth daily.   Yes Historical Provider, MD  calcitRIOL (ROCALTROL) 0.25 MCG capsule Take 0.25 mcg by mouth daily.   Yes Historical Provider, MD  colchicine 0.6 MG tablet Take 0.6 mg by mouth daily as needed. gout   Yes Historical Provider, MD  epoetin alfa (EPOGEN,PROCRIT) 03474 UNIT/ML injection Inject 40,000 Units into the skin every 30 (thirty) days. Takes injection the 1st Friday of every month   Yes Historical Provider, MD  furosemide (LASIX) 40 MG tablet Take 40 mg by mouth 2 (two) times daily as needed. Takes 40 mg daily and an additional 40 mg later in day if needed for swelling 06/22/11  Yes [name], MD    metoCLOPramide (REGLAN) 5 MG tablet Take 5 mg by mouth at bedtime. 02/23/11 02/23/12 Yes Milus Banister, MD  midodrine (PROAMATINE) 10 MG tablet Take 10 mg by mouth 2 (two) times daily.   Yes Historical Provider, MD  mineral oil enema Place 1 enema rectally once. 12/08/11  Yes Margarita Mail, PA-C  Multiple Vitamin (MULITIVITAMIN WITH MINERALS) TABS Take 1 tablet by mouth daily.   Yes Historical Provider, MD  omeprazole (PRILOSEC) 40 MG capsule Take 40 mg by mouth 2 (two) times daily.    Yes Historical Provider, MD  Oxycodone HCl 10 MG TABS Take 10 mg by mouth 4 (four) times daily as needed. For pain   Yes Historical Provider, MD  oxyCODONE-acetaminophen (PERCOCET) 10-650 MG per tablet Take 1 tablet by mouth every 6 (six) hours as needed. For pain.   Yes Historical Provider, MD  predniSONE (DELTASONE) 5 MG tablet Take 5 mg by mouth daily. 06/22/11  Yes [name], MD  promethazine (PHENERGAN) 25 MG tablet Take 25 mg by mouth every 6 (six) hours as needed. For nausea    Yes Historical Provider, MD  tacrolimus (PROGRAF) 1 MG capsule Take 2-3 mg by mouth 2 (two) times daily. Takes 3 mg in the morning and 2 mg at night 06/22/11  Yes [name], MD    Allergies  Allergen Reactions  . Levofloxacin Rash    Physical Exam  Vitals  Blood pressure 134/84, pulse 84, temperature 97.8 F (36.6 C), temperature source Oral, resp. rate 18, SpO2 96.00%.   1. General young AA female lying in bed in NAD,    2. Normal affect and insight, Not Suicidal or Homicidal, Awake Alert, Oriented X 3.  3. No F.N deficits, ALL C.Nerves Intact, Strength 5/5 all 4 extremities, Sensation intact all 4 extremities, Plantars down going.  4. Ears and Eyes appear Normal, Conjunctivae clear, PERRLA. Moist Oral Mucosa.  5. Supple Neck, No JVD, No cervical lymphadenopathy appriciated, No Carotid Bruits.  6. Symmetrical Chest wall movement, Good air movement bilaterally, CTAB.  7. RRR, No Gallops, Rubs or  Murmurs, No Parasternal Heave.  8. Positive Bowel Sounds, Abdomen Soft, Non tender, No organomegaly appriciated,No rebound -guarding or rigidity.  9.  No Cyanosis, Normal Skin Turgor, No Skin Rash or Bruise.  10. Good muscle tone,  joints appear normal , no effusions, Normal ROM.  11. No Palpable Lymph Nodes in Neck  or Axillae    Data Review  CBC  Lab 12/10/11 1432 12/08/11 1035 12/04/11 1358  WBC 5.6 4.6 --  HGB 11.6* 10.4* 11.1*  HCT 33.0* 29.2* --  PLT 190 182 --  MCV 86.6 86.9 --  MCH 30.4 31.0 --  MCHC 35.2 35.6 --  RDW 18.2* 17.8* --  LYMPHSABS 0.8 0.5* --  MONOABS 0.5 0.4 --  EOSABS 0.0 0.0 --  BASOSABS 0.0 0.0 --  BANDABS -- -- --   ------------------------------------------------------------------------------------------------------------------  Chemistries   Lab 12/10/11 1432 12/08/11 1035 12/04/11 1415  NA 136 138 138  K 3.3* 3.0* 3.8  CL 97 100 100  CO2 21 26 28   GLUCOSE 90 85 81  BUN 35* 36* 33*  CREATININE 3.61* 2.62* 2.30*  CALCIUM 10.8* 10.4 10.2  MG -- -- --  AST 23 16 --  ALT 9 9 --  ALKPHOS 72 73 --  BILITOT 0.6 0.4 --   ------------------------------------------------------------------------------------------------------------------ CrCl is unknown because both a height and weight (above a minimum accepted value) are required for this calculation. ------------------------------------------------------------------------------------------------------------------ No results found for this basename: TSH,T4TOTAL,FREET3,T3FREE,THYROIDAB in the last 72 hours   Coagulation profile No results found for this basename: INR:5,PROTIME:5 in the last 168 hours ------------------------------------------------------------------------------------------------------------------- No results found for this basename: DDIMER:2 in the last 72  hours -------------------------------------------------------------------------------------------------------------------  Cardiac Enzymes No results found for this basename: CK:3,CKMB:3,TROPONINI:3,MYOGLOBIN:3 in the last 168 hours ------------------------------------------------------------------------------------------------------------------ No components found with this basename: POCBNP:3   ---------------------------------------------------------------------------------------------------------------  Urinalysis    Component Value Date/Time   COLORURINE YELLOW 12/10/2011 1523   APPEARANCEUR CLEAR 12/10/2011 1523   LABSPEC 1.016 12/10/2011 1523   PHURINE 5.5 12/10/2011 1523   GLUCOSEU NEGATIVE 12/10/2011 1523   HGBUR NEGATIVE 12/10/2011 1523   BILIRUBINUR SMALL* 12/10/2011 1523   KETONESUR 15* 12/10/2011 1523   PROTEINUR 30* 12/10/2011 1523   UROBILINOGEN 0.2 12/10/2011 1523   NITRITE NEGATIVE 12/10/2011 1523   LEUKOCYTESUR SMALL* 12/10/2011 1523     Imaging results:   Ct Abdomen Pelvis Wo Contrast  12/08/2011  *RADIOLOGY REPORT*  Clinical Data: Abdominal pain, nausea/vomiting, renal transplant  CT ABDOMEN AND PELVIS WITHOUT CONTRAST  Technique:  Multidetector CT imaging of the abdomen and pelvis was performed following the standard protocol without intravenous contrast.  Comparison: 06/12/2011  Findings: Minimal subpleural opacity / scarring in the right lower lobe.  Oral contrast within the distal esophagus, suggesting gastroesophageal reflux or esophageal dysmotility.  Small hiatal hernia.  Unenhanced liver, pancreas, and adrenal glands are within normal limits.  Calcified granuloma in the medial spleen.  Gallbladder is unremarkable.  No intrahepatic or extrahepatic ductal dilatation.  Status post bilateral nephrectomy.  No evidence of bowel obstruction.  Stable right lower quadrant renal transplant.  No hydronephrosis.  Atherosclerotic calcifications of the abdominal aorta and branch  vessels.  Stable retrocrural/retroperitoneal collateral vessels.  No abdominopelvic ascites.  No suspicious lymphadenopathy.  Uterus is unremarkable.  2.0 cm right adnexal cyst.  Thick-walled bladder.  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  Visualized osseous structures are otherwise unremarkable.  IMPRESSION: No evidence of bowel obstruction.  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  Thick-walled bladder, correlate for cystitis.   Original Report Authenticated By: Julian Hy, M.D.    Dg Abd 1 View  12/08/2011  *RADIOLOGY REPORT*  Clinical Data: Abdominal pain, constipation, vomiting  ABDOMEN - 1 VIEW  Comparison: None.  Findings: There is nonspecific nonobstructive bowel gas pattern. Surgical clips are noted midline upper abdomen.  Surgical clips are noted bilateral pelvis.  Multiple pelvic phleboliths.  Moderate stool noted in transverse colon.  IMPRESSION: Nonspecific nonobstructive bowel gas pattern.  Postsurgical changes upper abdomen and within pelvis.  Moderate stool and transverse colon.   Original Report Authenticated By: Lahoma Crocker, M.D.    US Breast Right  11/24/2011  *RADIOLOGY REPORT*  Clinical Data:  Palpable lump in the upper outer right breast. This corresponds to an area of trauma to the breast several months ago where there was bruising which has subsequently resolved.  The palpable lump has decreased in size over time.  Annual reevaluation, left breast.  DIGITAL DIAGNOSTIC BILATERAL MAMMOGRAM WITH CAD AND RIGHT BREAST ULTRASOUND:  Comparison:  10/21/2010, 10/16/2009, dating back to 08/11/2007.  No prior ultrasound.  Findings:  CC and MLO views of both breasts and a spot tangential view of the right breast in the area of palpable concern were obtained.  An oval shaped circumscribed mass is identified in the upper outer right breast in the area of palpable concern, without associated architectural distortion or suspicious calcifications. Prominent veins are present in  both breasts, unchanged from prior examinations, likely collaterals indicative of central venous stenoses in this patient with a history of end-stage renal disease. No new suspicious mass, nonsurgical architectural distortion, or suspicious calcifications in the left breast. Mammographic images were processed with CAD.  On physical exam, I palpate freely mobile approximate 2-3 cm mass in the upper outer right breast, corresponding to the area of palpable concern per patient.  Ultrasound is performed, showing oval shaped horizontally oriented heterogeneous mass at the 11 o'clock position of the right breast, approximately 10 cm from the nipple, measuring approximately 2.5 x 1.1 x 1.7 cm, with sonographic characteristics of a hematoma.  No suspicious solid mass or abnormal acoustic shadowing was identified.  IMPRESSION:  1.  Likely benign hematoma in the upper outer right breast, corresponding to the area of palpable concern and corresponding to the area of prior trauma. 2.  No specific mammographic evidence of malignancy, left breast.  RECOMMENDATION: Follow up right breast ultrasound in 3 months to confirm the expected evolution of the right breast hematoma.  The patient was encouraged to perform monthly self breast examination and communicate any changes with her primary physician. Findings and recommendations were discussed with the patient and provided in written form at the time of the exam.  BI-RADS CATEGORY 3:  Probably benign finding(s) - short interval follow-up suggested.   Original Report Authenticated By: Deniece Portela, M.D.    Dg Abd Acute W/chest  12/10/2011  *RADIOLOGY REPORT*  Clinical Data: Abdominal pain, vomiting  ACUTE ABDOMEN SERIES (ABDOMEN 2 VIEW & CHEST 1 VIEW)  Comparison: 12/08/2011 and 05/23/2011  Findings: Cardiomediastinal silhouette is stable.  Stable stent in SVC region.  No acute infiltrate or pleural effusion.  There is nonspecific nonobstructive bowel gas pattern.  Stable  postsurgical changes upper abdomen and within pelvis.  No free abdominal air.  IMPRESSION: No acute disease.  No significant change.   Original Report Authenticated By: Lahoma Crocker, M.D.    Mm Digital Diagnostic Bilat  11/24/2011  *RADIOLOGY REPORT*  Clinical Data:  Palpable lump in the upper outer right breast. This corresponds to an area of trauma to the breast several months ago where there was bruising which has subsequently resolved.  The palpable lump has decreased in size over time.  Annual reevaluation, left breast.  DIGITAL DIAGNOSTIC BILATERAL MAMMOGRAM WITH CAD AND RIGHT BREAST ULTRASOUND:  Comparison:  10/21/2010, 10/16/2009, dating back to 08/11/2007.  No prior ultrasound.  Findings:  CC and MLO views of both breasts and a spot tangential view of the right breast in the area of palpable concern were obtained.  An oval shaped circumscribed mass is identified in the upper outer right breast in the area of palpable concern, without associated architectural distortion or suspicious calcifications. Prominent veins are present in both breasts, unchanged from prior examinations, likely collaterals indicative of central venous stenoses in this patient with a history of end-stage renal disease. No new suspicious mass, nonsurgical architectural distortion, or suspicious calcifications in the left breast. Mammographic images were processed with CAD.  On physical exam, I palpate freely mobile approximate 2-3 cm mass in the upper outer right breast, corresponding to the area of palpable concern per patient.  Ultrasound is performed, showing oval shaped horizontally oriented heterogeneous mass at the 11 o'clock position of the right breast, approximately 10 cm from the nipple, measuring approximately 2.5 x 1.1 x 1.7 cm, with sonographic characteristics of a hematoma.  No suspicious solid mass or abnormal acoustic shadowing was identified.  IMPRESSION:  1.  Likely benign hematoma in the upper outer right breast,  corresponding to the area of palpable concern and corresponding to the area of prior trauma. 2.  No specific mammographic evidence of malignancy, left breast.  RECOMMENDATION: Follow up right breast ultrasound in 3 months to confirm the expected evolution of the right breast hematoma.  The patient was encouraged to perform monthly self breast examination and communicate any changes with her primary physician. Findings and recommendations were discussed with the patient and provided in written form at the time of the exam.  BI-RADS CATEGORY 3:  Probably benign finding(s) - short interval follow-up suggested.   Original Report Authenticated By: Deniece Portela, M.D.     My personal review of EKG: Rhythm NSR, Rate  59 /min, no Acute ST changes    Assessment & Plan  Principal Problem:  *Acute on chronic kidney failure Active Problems:  Gastroparesis  NAUSEA AND VOMITING  Septic shock(785.52)  ESRD (end stage renal disease)  Anxiety    1. Nausea vomiting causing dehydration prerenal azotemia and acute on chronic renal failure in a renal transplant patient with gastroparesis - CT abdomen pelvis from yesterday essentially unremarkable, KUB today stable, patient already feeling much better with supportive care, plan is to continue her Reglan, IV fluids for hydration, hold her Lasix and potassium, repeat BMP in the morning, clear liquid diet, nephrology to see the patient in the morning, Dr. Mercy Moore already informed. Her symptoms do not resolve adrenal insufficiency should be considered as patient on chronic steroids.    2. Renal failure status post renal transplant. We'll continue home medications, nephrology to see and address further need for any changes in her medications. Of note patient is on prednisone, if she becomes hypotensive IV steroids can be considered.    3. History of anxiety. No acute issues continue her when necessary Xanax.    4. Borderline UA. Monitor urine  cultures.    DVT Prophylaxis Heparin   AM Labs Ordered, also please review Full Orders  Family Communication: Admission, patients condition and plan of care including tests being ordered have been discussed with the patient and family who indicate understanding and agree with the plan and Code Status.  Code Status full  Disposition Plan:  home  Time spent in minutes : 35  Condition GUARDED  Thurnell Lose M.D on 12/10/2011 at 7:36 PM  Between 7am to 7pm - Pager - [phone]  After 7pm go  to [email or web address] - password [email or web address]  And look for the night coverage person covering me after hours  [facility] Office  [phone]

## 2011-12-11 ENCOUNTER — Encounter (HOSPITAL_COMMUNITY): Payer: Self-pay | Admitting: General Practice

## 2011-12-11 DIAGNOSIS — R112 Nausea with vomiting, unspecified: Secondary | ICD-10-CM

## 2011-12-11 DIAGNOSIS — K3184 Gastroparesis: Principal | ICD-10-CM

## 2011-12-11 LAB — CBC
HCT: 26.9 % — ABNORMAL LOW (ref 36.0–46.0)
HCT: 27.6 % — ABNORMAL LOW (ref 36.0–46.0)
Hemoglobin: 9.2 g/dL — ABNORMAL LOW (ref 12.0–15.0)
Hemoglobin: 9.8 g/dL — ABNORMAL LOW (ref 12.0–15.0)
MCH: 30.3 pg (ref 26.0–34.0)
MCH: 31 pg (ref 26.0–34.0)
MCHC: 34.2 g/dL (ref 30.0–36.0)
MCHC: 35.5 g/dL (ref 30.0–36.0)
MCV: 87.3 fL (ref 78.0–100.0)
MCV: 88.5 fL (ref 78.0–100.0)
Platelets: 167 10*3/uL (ref 150–400)
Platelets: 168 10*3/uL (ref 150–400)
RBC: 3.04 MIL/uL — ABNORMAL LOW (ref 3.87–5.11)
RBC: 3.16 MIL/uL — ABNORMAL LOW (ref 3.87–5.11)
RDW: 18 % — ABNORMAL HIGH (ref 11.5–15.5)
RDW: 18.4 % — ABNORMAL HIGH (ref 11.5–15.5)
WBC: 3.9 10*3/uL — ABNORMAL LOW (ref 4.0–10.5)
WBC: 4.9 10*3/uL (ref 4.0–10.5)

## 2011-12-11 LAB — BASIC METABOLIC PANEL
BUN: 27 mg/dL — ABNORMAL HIGH (ref 6–23)
CO2: 22 mEq/L (ref 19–32)
Calcium: 9.5 mg/dL (ref 8.4–10.5)
Chloride: 106 mEq/L (ref 96–112)
Creatinine, Ser: 2.7 mg/dL — ABNORMAL HIGH (ref 0.50–1.10)
GFR calc Af Amer: 24 mL/min — ABNORMAL LOW (ref >90)
GFR calc non Af Amer: 20 mL/min — ABNORMAL LOW (ref >90)
Glucose, Bld: 80 mg/dL (ref 70–99)
Potassium: 4.5 mEq/L (ref 3.5–5.1)
Sodium: 137 mEq/L (ref 135–145)

## 2011-12-11 LAB — URINE CULTURE
Colony Count: NO GROWTH
Culture: NO GROWTH

## 2011-12-11 LAB — PROTIME-INR
INR: 1.02 (ref 0.00–1.49)
Prothrombin Time: 13.3 seconds (ref 11.6–15.2)

## 2011-12-11 LAB — HEMOGLOBIN A1C
Hgb A1c MFr Bld: 5.5 % (ref <5.7)
Mean Plasma Glucose: 111 mg/dL (ref <117)

## 2011-12-11 LAB — TSH: TSH: 0.839 u[IU]/mL (ref 0.350–4.500)

## 2011-12-11 LAB — CREATININE, SERUM
Creatinine, Ser: 3.08 mg/dL — ABNORMAL HIGH (ref 0.50–1.10)
GFR calc Af Amer: 20 mL/min — ABNORMAL LOW (ref >90)
GFR calc non Af Amer: 17 mL/min — ABNORMAL LOW (ref >90)

## 2011-12-11 MED ORDER — ENSURE COMPLETE PO LIQD
237.0000 mL | ORAL | Status: DC
  Administered 2011-12-11: 237 mL via ORAL

## 2011-12-11 MED ORDER — HYDROMORPHONE HCL PF 1 MG/ML IJ SOLN
1.0000 mg | INTRAMUSCULAR | Status: DC | PRN
  Administered 2011-12-11 – 2011-12-12 (×4): 1 mg via INTRAVENOUS
  Filled 2011-12-11 (×5): qty 1

## 2011-12-11 MED ORDER — DIPHENHYDRAMINE HCL 25 MG PO CAPS
25.0000 mg | ORAL_CAPSULE | Freq: Four times a day (QID) | ORAL | Status: DC | PRN
  Administered 2011-12-11: 25 mg via ORAL
  Filled 2011-12-11 (×2): qty 1

## 2011-12-11 MED ORDER — DIPHENHYDRAMINE HCL 25 MG PO CAPS
25.0000 mg | ORAL_CAPSULE | Freq: Once | ORAL | Status: AC
  Administered 2011-12-11: 25 mg via ORAL
  Filled 2011-12-11 (×2): qty 1

## 2011-12-11 NOTE — Progress Notes (Addendum)
[facility]             Progress Note   Subjective: N/V improved, c/o back/rib (chronic) pains  Objective: Vital signs in last 24 hours: Temp:  [97.8 F (36.6 C)-98.7 F (37.1 C)] 98.7 F (37.1 C) (11/08 0537) Pulse Rate:  [51-84] 51  (11/08 0537) Resp:  [16-18] 16  (11/08 0537) BP: (103-134)/(60-84) 104/62 mmHg (11/08 0537) SpO2:  [96 %-100 %] 99 % (11/08 0537) Weight:  [58.3 kg (128 lb 8.5 oz)] 58.3 kg (128 lb 8.5 oz) (11/07 2148) Weight change:     Intake/Output from previous day:       Physical Exam: General: Alert, awake, oriented x3, in no acute distress. HEENT: No bruits, no goiter. Heart: Regular rate and rhythm, without murmurs, rubs, gallops. Lungs: Clear to auscultation bilaterally. Abdomen: Soft, nontender, nondistended, positive bowel sounds. Extremities: No clubbing cyanosis or edema with positive pedal pulses. Lower back: mild paraspinal tenderness Neuro: Grossly intact, nonfocal.    Lab Results: Basic Metabolic Panel:  Basename 12/11/11 0630 12/10/11 2345 12/10/11 1432  NA 137 -- 136  K 4.5 -- 3.3*  CL 106 -- 97  CO2 22 -- 21  GLUCOSE 80 -- 90  BUN 27* -- 35*  CREATININE 2.70* 3.08* --  CALCIUM 9.5 -- 10.8*  MG -- -- --  PHOS -- -- --   Liver Function Tests:  [facility] 12/10/11 1432 12/08/11 1035  AST 23 16  ALT 9 9  ALKPHOS 72 73  BILITOT 0.6 0.4  PROT 7.6 7.2  ALBUMIN 4.6 4.4    Basename 12/10/11 1432  LIPASE 35  AMYLASE --   No results found for this basename: AMMONIA:2 in the last 72 hours CBC:  Basename 12/11/11 0630 12/10/11 2345 12/10/11 1432 12/08/11 1035  WBC 3.9* 4.9 -- --  NEUTROABS -- -- 4.3 3.6  HGB 9.2* 9.8* -- --  HCT 26.9* 27.6* -- --  MCV 88.5 87.3 -- --  PLT 167 168 -- --   Cardiac Enzymes: No results found for this basename: CKTOTAL:3,CKMB:3,CKMBINDEX:3,TROPONINI:3 in the last 72 hours BNP: No results found for this basename: PROBNP:3 in the last 72 hours D-Dimer: No results found for  this basename: DDIMER:2 in the last 72 hours CBG: No results found for this basename: GLUCAP:6 in the last 72 hours Hemoglobin A1C:  Basename 12/10/11 2345  HGBA1C 5.5   Fasting Lipid Panel: No results found for this basename: CHOL,HDL,LDLCALC,TRIG,CHOLHDL,LDLDIRECT in the last 72 hours Thyroid Function Tests:  Basename 12/10/11 2345  TSH 0.839  T4TOTAL --  FREET4 --  T3FREE --  THYROIDAB --   Anemia Panel: No results found for this basename: VITAMINB12,FOLATE,FERRITIN,TIBC,IRON,RETICCTPCT in the last 72 hours Coagulation:  Basename 12/10/11 2345  LABPROT 13.3  INR 1.02   Urine Drug Screen: Drugs of Abuse  No results found for this basename: labopia, cocainscrnur, labbenz, amphetmu, thcu, labbarb    Alcohol Level: No results found for this basename: ETH:2 in the last 72 hours Urinalysis:  Basename 12/10/11 1523 12/08/11 1233  COLORURINE YELLOW YELLOW  LABSPEC 1.016 1.016  PHURINE 5.5 5.5  GLUCOSEU NEGATIVE NEGATIVE  HGBUR NEGATIVE NEGATIVE  BILIRUBINUR SMALL* NEGATIVE  KETONESUR 15* 15*  PROTEINUR 30* 30*  UROBILINOGEN 0.2 0.2  NITRITE NEGATIVE NEGATIVE  LEUKOCYTESUR SMALL* NEGATIVE   Misc. Labs:  No results found for this or any previous visit (from the past 240 hour(s)).  Studies/Results: Dg Abd Acute W/chest  12/10/2011  *RADIOLOGY REPORT*  Clinical Data: Abdominal pain, vomiting  ACUTE ABDOMEN SERIES (  ABDOMEN 2 VIEW & CHEST 1 VIEW)  Comparison: 12/08/2011 and 05/23/2011  Findings: Cardiomediastinal silhouette is stable.  Stable stent in SVC region.  No acute infiltrate or pleural effusion.  There is nonspecific nonobstructive bowel gas pattern.  Stable postsurgical changes upper abdomen and within pelvis.  No free abdominal air.  IMPRESSION: No acute disease.  No significant change.   Original Report Authenticated By: Lahoma Crocker, M.D.     Medications: Scheduled Meds:   . [COMPLETED] sodium chloride   Intravenous Once  . aspirin EC  81 mg Oral Daily  .  azaTHIOprine  100 mg Oral Daily  . calcitRIOL  0.25 mcg Oral Daily  . diphenhydrAMINE  25 mg Oral Once  . heparin  5,000 Units Subcutaneous Q8H  . [COMPLETED]  HYDROmorphone (DILAUDID) injection  1 mg Intravenous Once  . metoCLOPramide  5 mg Oral QHS  . midodrine  10 mg Oral BID WC  . [COMPLETED]  morphine injection  4 mg Intravenous Once  . multivitamin with minerals  1 tablet Oral Daily  . [COMPLETED] ondansetron (ZOFRAN) IV  4 mg Intravenous Once  . pantoprazole  40 mg Oral Daily  . [COMPLETED] potassium chloride  20 mEq Oral Once  . predniSONE  5 mg Oral Daily  . [COMPLETED] sodium chloride  1,000 mL Intravenous Once  . [COMPLETED] sodium chloride  500 mL Intravenous Once  . tacrolimus  2 mg Oral QHS  . tacrolimus  3 mg Oral Daily  . [DISCONTINUED] midodrine  10 mg Oral BID  . [DISCONTINUED] mineral oil  1 enema Rectal Once  . [DISCONTINUED] mineral oil  1 enema Rectal Once   Continuous Infusions:   . sodium chloride 100 mL/hr at 12/11/11 0109   PRN Meds:.albuterol, ALPRAZolam, guaiFENesin-dextromethorphan, HYDROcodone-acetaminophen, [EXPIRED]  HYDROmorphone (DILAUDID) injection, morphine injection, [EXPIRED] ondansetron (ZOFRAN) IV, ondansetron (ZOFRAN) IV, promethazine  Assessment/Plan:  1. Nausea vomiting causing dehydration prerenal azotemia and acute on chronic renal failure in a renal transplant patient with gastroparesis -  CT abdomen pelvis from 11/5 and KUB 11/7 stable,  Improving continue supportive care, Reglan, cut down IVF, hold her Lasix and potassium, Advance diet nephrology to see the patient this morning, Dr. Mercy Moore already informed.  Fu urine CX Hopefully home later today  2. Back pain: somewhat chronic, recent CT without pyelonephritis Ambulate, PT  2. Renal failure status post renal transplant.  Improved, close to baseline, cut down IVF, hold lasix today  3. History of anxiety. No acute issues continue her when necessary Xanax.  4. Borderline  UA. Monitor urine cultures.   5. Normocytic anemia: likely from CKD, no overt blood loss  DVT Prophylaxis Heparin   Family Communication:  discussed with the patient    Code Status full   Disposition Plan: home later today   Time spent coordinating care: 58min   LOS: 1 day   [facility] Pager: [phone] 12/11/2011, 8:38 AM

## 2011-12-11 NOTE — Progress Notes (Signed)
INITIAL ADULT NUTRITION ASSESSMENT Date: 12/11/2011   Time: 11:20 AM  Reason for Assessment: Malnutrition Screening  INTERVENTION: 1. Ensure Complete po daily, each supplement provides 350 kcal and 13 grams of protein. 2. RD to continue to follow nutrition care plan  DOCUMENTATION CODES Per approved criteria  -Severe malnutrition in the context of acute illness or injury   ASSESSMENT: Female 46 y.o.  Dx: Acute on chronic kidney failure  Hx:  Past Medical History  Diagnosis Date  . Gastroparesis   . Gout   . Hypotension   . Herpes genitalia 1994  . E coli bacteremia 06/18/2011  . Bacteremia due to Gram-negative bacteria 05/23/2011  . ESRD (end stage renal disease) 06/12/2011  . Complication of anesthesia     "woke up during OR; I have an extremely high tolerance" (12/11/2011)  . Dysrhythmia     "tachycardia" (12/11/2011)  . Iron deficiency anemia   . History of blood transfusion     "more than a few times" (12/11/2011)  . History of stomach ulcers   . GERD (gastroesophageal reflux disease)   . Headache     "not often anymore" (12/11/2011)  . Seizures 1994    "post transplant; only have had that one" (12/11/2011)  . Stroke      left basal ganglia lacunar infarct; Right frontal lobe lacunar infarct.  . Stroke ~ 1999; 2001    "briefly lost my vision; lost my right eye" (12/11/2011)  . Chronic lower back pain   . Depression   . Osteopenia    Past Surgical History  Procedure Date  . Kidney transplant 1994; 1999; 2005    "right"  . Appendectomy ~ 2004  . Enucleation 2001    "right"  . Insertion of dialysis catheter 1988    "AV graft LUA & LFA; LUA worked for 1 day; LFA never workedChief Strategy Officer  . Total nephrectomy 1988?; 1994; 2005  . Multiple tooth extractions    Related Meds:     . [COMPLETED] sodium chloride   Intravenous Once  . aspirin EC  81 mg Oral Daily  . azaTHIOprine  100 mg Oral Daily  . calcitRIOL  0.25 mcg Oral Daily  . diphenhydrAMINE  25 mg Oral Once  .  heparin  5,000 Units Subcutaneous Q8H  . [COMPLETED]  HYDROmorphone (DILAUDID) injection  1 mg Intravenous Once  . metoCLOPramide  5 mg Oral QHS  . midodrine  10 mg Oral BID WC  . [COMPLETED]  morphine injection  4 mg Intravenous Once  . multivitamin with minerals  1 tablet Oral Daily  . [COMPLETED] ondansetron (ZOFRAN) IV  4 mg Intravenous Once  . pantoprazole  40 mg Oral Daily  . [COMPLETED] potassium chloride  20 mEq Oral Once  . predniSONE  5 mg Oral Daily  . [COMPLETED] sodium chloride  1,000 mL Intravenous Once  . [COMPLETED] sodium chloride  500 mL Intravenous Once  . tacrolimus  2 mg Oral QHS  . tacrolimus  3 mg Oral Daily  . [DISCONTINUED] midodrine  10 mg Oral BID  . [DISCONTINUED] mineral oil  1 enema Rectal Once  . [DISCONTINUED] mineral oil  1 enema Rectal Once   Ht: 4\' 11"  (149.9 cm)  Wt: 128 lb 8.5 oz (58.3 kg)  Ideal Wt: 98 lb/44.5 kg % Ideal Wt: 130%  Wt Readings from Last 15 Encounters:  12/10/11 128 lb 8.5 oz (58.3 kg)  06/21/11 142 lb 13.7 oz (64.8 kg)  05/29/11 135 lb 5.8 oz (61.4 kg)  02/23/11 131  lb (59.421 kg)  07/26/09 112 lb (50.803 kg)  10/03/08 112 lb (50.803 kg)  04/24/08 123 lb (55.792 kg)  03/21/08 120 lb 8 oz (54.658 kg)  08/24/07 133 lb 6.1 oz (60.501 kg)  Usual Wt: 130 lb % Usual Wt: 98%  Body mass index is 25.96 kg/(m^2). Overweight  Food/Nutrition Related Hx: pt with hx of renal transplant  Labs:  CMP     Component Value Date/Time   NA 137 12/11/2011 0630   K 4.5 12/11/2011 0630   CL 106 12/11/2011 0630   CO2 22 12/11/2011 0630   GLUCOSE 80 12/11/2011 0630   BUN 27* 12/11/2011 0630   CREATININE 2.70* 12/11/2011 0630   CALCIUM 9.5 12/11/2011 0630   PROT 7.6 12/10/2011 1432   ALBUMIN 4.6 12/10/2011 1432   AST 23 12/10/2011 1432   ALT 9 12/10/2011 1432   ALKPHOS 72 12/10/2011 1432   BILITOT 0.6 12/10/2011 1432   GFRNONAA 20* 12/11/2011 0630   GFRAA 24* 12/11/2011 0630   Phosphorus  Date/Time Value Range Status  12/04/2011  2:15 PM 3.3   2.3 - 4.6 mg/dL Final   Lab Results  Component Value Date   HGBA1C 5.5 12/10/2011    Intake/Output Summary (Last 24 hours) at 12/11/11 1125 Last data filed at 12/11/11 0900  Gross per 24 hour  Intake    120 ml  Output      0 ml  Net    120 ml   Diet Order: General  Supplements/Tube Feeding: none  IVF:    sodium chloride Last Rate: 50 mL/hr at 12/11/11 0844   Estimated Nutritional Needs:   Kcal: 1660 - 1915 kcal Protein:  46 - 58 kg Fluid: 1.8 - 2 liters daily  Initially went to ED for abdominal discomfort, received an enema with good results. Pt then continued to have n/v and abdominal pain. Lab work reveals acute renal insufficiency. Work-up reveals prerenal azotemia and acute on chronic renal failure in a renal transplant patient with gastroparesis.  Pt reports that her intake is improving with each meal. Able to keep food down. Doesn't follow any diet restrictions at this time. Has had 5 lb wt loss this past week 2/2 inability to keep food down; this is a 4% wt change. Would like Ensure Complete PO daily.  Pt meets criteria for severe malnutrition in the context of acute illness as evidenced by intake of <50% x at least 5 days and wt loss of 4% x 1 week.  NUTRITION DIAGNOSIS: Inadequate oral intake r/t GI distress AEB dietary recall and recent weight loss.  MONITORING/EVALUATION(Goals): Goal: Pt to meet >/= 90% of their estimated nutrition needs Monitor: weight trends, lab trends, I/O's, PO intake, supplement tolerance  EDUCATION NEEDS: -No education needs identified at this time    Inda Coke MS, RD, LDN Pager: [phone] After-hours pager: [phone]

## 2011-12-11 NOTE — Consult Note (Signed)
Referring Provider: No ref. provider found Primary Care Physician:  [name], MD Primary Nephrologist:  Dr. Arty Baumgartner  Reason for Consultation: Acute non oliguric renal failure HPI: Tina Watkins is a 46 y.o. female, with H/O Renal Transplant, Gateroperesis, Gout, who has been having N&V for the last 1 week or so, she initially came to the ER yesterday where she had a CT abdomen pelvis which was essentially unremarkable except for some bladder thickening, she said that she had been constipated but yesterday in the ER she received an enema with good results. 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   Past Medical History  Diagnosis Date  . Gastroparesis   . Gout   . Hypotension   . Herpes genitalia 1994  . E coli bacteremia 06/18/2011  . Bacteremia due to Gram-negative bacteria 05/23/2011  . ESRD (end stage renal disease) 06/12/2011  . Complication of anesthesia     "woke up during OR; I have an extremely high tolerance" (12/11/2011)  . Dysrhythmia     "tachycardia" (12/11/2011)  . Iron deficiency anemia   . History of blood transfusion     "more than a few times" (12/11/2011)  . History of stomach ulcers   . GERD (gastroesophageal reflux disease)   . Headache     "not often anymore" (12/11/2011)  . Seizures 1994    "post transplant; only have had that one" (12/11/2011)  . Stroke      left basal ganglia lacunar infarct; Right frontal lobe lacunar infarct.  . Stroke ~ 1999; 2001    "briefly lost my vision; lost my right eye" (12/11/2011)  . Chronic lower back pain   . Depression   . Osteopenia     Past Surgical History  Procedure Date  . Kidney transplant 1994; 1999; 2005    "right"  . Appendectomy ~ 2004  . Enucleation 2001    "right"  . Insertion of dialysis catheter 1988    "AV graft LUA & LFA; LUA worked for 1 day; LFA never workedChief Strategy Officer  . Total nephrectomy 1988?; 1994; 2005  . Multiple tooth extractions     Prior to Admission  medications   Medication Sig Start Date End Date Taking? Authorizing Provider  ALPRAZolam (XANAX) 0.25 MG tablet Take 0.25 mg by mouth 2 (two) times daily as needed. For anxiety   Yes Historical Provider, MD  aspirin EC 81 MG tablet Take 81 mg by mouth daily.   Yes Historical Provider, MD  azaTHIOprine (IMURAN) 50 MG tablet Take 100 mg by mouth daily.   Yes Historical Provider, MD  calcitRIOL (ROCALTROL) 0.25 MCG capsule Take 0.25 mcg by mouth daily.   Yes Historical Provider, MD  colchicine 0.6 MG tablet Take 0.6 mg by mouth daily as needed. gout   Yes Historical Provider, MD  epoetin alfa (EPOGEN,PROCRIT) 57846 UNIT/ML injection Inject 40,000 Units into the skin every 30 (thirty) days. Takes injection the 1st Friday of every month   Yes Historical Provider, MD  furosemide (LASIX) 40 MG tablet Take 40 mg by mouth 2 (two) times daily as needed. Takes 40 mg daily and an additional 40 mg later in day if needed for swelling 06/22/11  Yes [name], MD  metoCLOPramide (REGLAN) 5 MG tablet Take 5 mg by mouth at bedtime. 02/23/11 02/23/12 Yes Milus Banister, MD  midodrine (PROAMATINE) 10 MG tablet Take 10 mg by mouth 2 (two) times daily.   Yes Historical Provider, MD  mineral oil enema Place 1  enema rectally once. 12/08/11  Yes Margarita Mail, PA-C  Multiple Vitamin (MULITIVITAMIN WITH MINERALS) TABS Take 1 tablet by mouth daily.   Yes Historical Provider, MD  omeprazole (PRILOSEC) 40 MG capsule Take 40 mg by mouth 2 (two) times daily.    Yes Historical Provider, MD  Oxycodone HCl 10 MG TABS Take 10 mg by mouth 4 (four) times daily as needed. For pain   Yes Historical Provider, MD  oxyCODONE-acetaminophen (PERCOCET) 10-650 MG per tablet Take 1 tablet by mouth every 6 (six) hours as needed. For pain.   Yes Historical Provider, MD  predniSONE (DELTASONE) 5 MG tablet Take 5 mg by mouth daily. 06/22/11  Yes [name], MD  promethazine (PHENERGAN) 25 MG tablet Take 25 mg by mouth every 6 (six)  hours as needed. For nausea    Yes Historical Provider, MD  tacrolimus (PROGRAF) 1 MG capsule Take 2-3 mg by mouth 2 (two) times daily. Takes 3 mg in the morning and 2 mg at night 06/22/11  Yes [name], MD    Current Facility-Administered Medications  Medication Dose Route Frequency Provider Last Rate Last Dose  . [COMPLETED] 0.9 %  sodium chloride infusion   Intravenous Once [employer], PA-C 100 mL/hr at 12/10/11 1818    . 0.9 %  sodium chloride infusion   Intravenous Continuous Domenic Polite, MD 50 mL/hr at 12/11/11 0844    . albuterol (PROVENTIL) (5 MG/ML) 0.5% nebulizer solution 2.5 mg  2.5 mg Nebulization Q2H PRN Thurnell Lose, MD      . ALPRAZolam Duanne Moron) tablet 0.25 mg  0.25 mg Oral BID PRN Thurnell Lose, MD      . aspirin EC tablet 81 mg  81 mg Oral Daily Thurnell Lose, MD   81 mg at 12/11/11 1007  . azaTHIOprine (IMURAN) tablet 100 mg  100 mg Oral Daily Thurnell Lose, MD   100 mg at 12/11/11 1007  . calcitRIOL (ROCALTROL) capsule 0.25 mcg  0.25 mcg Oral Daily Thurnell Lose, MD   0.25 mcg at 12/11/11 1006  . diphenhydrAMINE (BENADRYL) capsule 25 mg  25 mg Oral Once Ambrose Finland, NP      . guaiFENesin-dextromethorphan (ROBITUSSIN DM) 100-10 MG/5ML syrup 5 mL  5 mL Oral Q4H PRN Thurnell Lose, MD      . heparin injection 5,000 Units  5,000 Units Subcutaneous Q8H Thurnell Lose, MD   5,000 Units at 12/11/11 0552  . HYDROcodone-acetaminophen (NORCO/VICODIN) 5-325 MG per tablet 1-2 tablet  1-2 tablet Oral Q4H PRN Thurnell Lose, MD      . [COMPLETED] HYDROmorphone (DILAUDID) injection 1 mg  1 mg Intravenous Once Nicole Pisciotta, PA-C   1 mg at 12/10/11 1631  . [EXPIRED] HYDROmorphone (DILAUDID) injection 1 mg  1 mg Intravenous Q4H PRN Nicole Pisciotta, PA-C   1 mg at 12/11/11 0014  . HYDROmorphone (DILAUDID) injection 1 mg  1 mg Intravenous Q4H PRN Domenic Polite, MD   1 mg at 12/11/11 1003  . metoCLOPramide (REGLAN) tablet 5 mg  5 mg Oral QHS Thurnell Lose, MD   5 mg at 12/11/11 0243  . midodrine (PROAMATINE) tablet 10 mg  10 mg Oral BID WC Thurnell Lose, MD   10 mg at 12/11/11 0848  . [COMPLETED] morphine 4 MG/ML injection 4 mg  4 mg Intravenous Once [employer], PA-C   4 mg at 12/10/11 1525  . multivitamin with minerals tablet 1 tablet  1 tablet Oral  Daily Thurnell Lose, MD   1 tablet at 12/11/11 1007  . [COMPLETED] ondansetron (ZOFRAN) injection 4 mg  4 mg Intravenous Once [employer], PA-C   4 mg at 12/10/11 1525  . [EXPIRED] ondansetron (ZOFRAN) injection 4 mg  4 mg Intravenous Q8H PRN Nicole Pisciotta, PA-C      . ondansetron (ZOFRAN) injection 4 mg  4 mg Intravenous Q6H PRN Thurnell Lose, MD   4 mg at 12/11/11 1046  . pantoprazole (PROTONIX) EC tablet 40 mg  40 mg Oral Daily Thurnell Lose, MD   40 mg at 12/11/11 1133  . [COMPLETED] potassium chloride SA (K-DUR,KLOR-CON) CR tablet 20 mEq  20 mEq Oral Once Thurnell Lose, MD   20 mEq at 12/10/11 1941  . predniSONE (DELTASONE) tablet 5 mg  5 mg Oral Daily Thurnell Lose, MD   5 mg at 12/11/11 1006  . promethazine (PHENERGAN) tablet 25 mg  25 mg Oral Q6H PRN Thurnell Lose, MD      . [COMPLETED] sodium chloride 0.9 % bolus 1,000 mL  1,000 mL Intravenous Once Elmyra Ricks Pisciotta, PA-C   1,000 mL at 12/10/11 1526  . [COMPLETED] sodium chloride 0.9 % bolus 500 mL  500 mL Intravenous Once [employer], PA-C   500 mL at 12/10/11 1632  . tacrolimus (PROGRAF) capsule 2 mg  2 mg Oral QHS Thurnell Lose, MD   2 mg at 12/11/11 0243  . tacrolimus (PROGRAF) capsule 3 mg  3 mg Oral Daily Thurnell Lose, MD   3 mg at 12/11/11 1006  . [DISCONTINUED] midodrine (PROAMATINE) tablet 10 mg  10 mg Oral BID Thurnell Lose, MD      . [DISCONTINUED] mineral oil enema 1 enema  1 enema Rectal Once Thurnell Lose, MD      . [DISCONTINUED] mineral oil enema 1 enema  1 enema Rectal Once Thurnell Lose, MD      . [DISCONTINUED] morphine 2 MG/ML injection 1 mg  1 mg Intravenous Q2H  PRN Thurnell Lose, MD   1 mg at 12/11/11 0844    Allergies as of 12/10/2011 - Review Complete 12/10/2011  Allergen Reaction Noted  . Levofloxacin Rash 05/23/2011    Family History  Problem Relation Age of Onset  . Hypertension Maternal Grandmother     History   Social History  . Marital Status: Single    Spouse Name: N/A    Number of Children: 0  . Years of Education: N/A   Occupational History  . disabiled    Social History Main Topics  . Smoking status: Former Smoker -- 0.5 packs/day for 28 years    Types: Cigarettes    Quit date: 03/05/2011  . Smokeless tobacco: Never Used  . Alcohol Use: Yes     Comment: 12/11/2011 "rarely have a mixed drink"  . Drug Use: Yes    Special: Marijuana     Comment: 12/11/2011 "last marijuana was 2 days ago; helps my appetite"  . Sexually Active: Yes   Other Topics Concern  . Not on file   Social History Narrative  . No narrative on file    Review of Systems: Gen: Denies any fever, chills, sweats, anorexia, fatigue, weakness, malaise, weight loss, and sleep disorder HEENT: No visual complaints,  CV: Denies chest pain, angina, palpitations, syncope, orthopnea, PND, peripheral edema, and claudication. Resp: Denies dyspnea at rest, dyspnea with exercise, cough, sputum, wheezing, coughing up blood, and pleurisy. GI: Denies vomiting blood, jaundice,  and fecal incontinence.   Denies dysphagia or odynophagia. GU : Denies urinary burning, blood in urine, urinary frequency, urinary hesitancy, nocturnal urination, and urinary incontinence.  No renal calculi. MS: general myalgias Derm: Denies rash, itching, dry skin, hives, moles, warts, or unhealing ulcers.  Psych: Denies depression, anxiety, memory loss, suicidal ideation, hallucinations, paranoia, and confusion. Heme: Denies bruising, bleeding, and enlarged lymph nodes. Neuro: No headache.  No diplopia. No dysarthria.  No dysphasia.  No history of CVA.  No Seizures. No paresthesias.  No  weakness. Endocrine No DM.  No Thyroid disease.  No Adrenal disease.  Physical Exam: Vital signs in last 24 hours: Temp:  [97.8 F (36.6 C)-98.7 F (37.1 C)] 97.9 F (36.6 C) (11/08 1000) Pulse Rate:  [51-84] 55  (11/08 1000) Resp:  [16-18] 16  (11/08 1000) BP: (98-134)/(56-84) 98/56 mmHg (11/08 1000) SpO2:  [96 %-100 %] 99 % (11/08 1000) Weight:  [58.3 kg (128 lb 8.5 oz)] 58.3 kg (128 lb 8.5 oz) (11/07 2148) Last BM Date: 12/09/11 General:   Chronically ill appearing Head:  Normocephalic and atraumatic. Eyes:  Pale  Ears:  Normal auditory acuity. Nose:  No deformity, discharge,  or lesions. Mouth:  No deformity or lesions, dentition normal. Neck:  Supple; no masses or thyromegaly. JVP not elevated Lungs:  Clear throughout to auscultation.   No wheezes, crackles, or rhonchi. No acute distress. Heart:  Regular rate and rhythm; no murmurs, clicks, rubs,  or gallops. Abdomen:  Soft, nontender and nondistended. No masses, hepatosplenomegaly or hernias noted. Normal bowel sounds, without guarding, and without rebound. Transplant palpable non tender  Msk:  Symmetrical without gross deformities. Normal posture. Pulses:  No carotid, renal, femoral bruits. DP and PT symmetrical and equal Extremities:  Without clubbing or edema. Neurologic:  Alert and  oriented x4;  grossly normal neurologically. Skin:  Intact without significant lesions or rashes. Cervical Nodes:  No significant cervical adenopathy. Psych:  Alert and cooperative. Normal mood and affect.  Intake/Output from previous day:   Intake/Output this shift: Total I/O In: 120 [P.O.:120] Out: -   Lab Results:  Basename 12/11/11 0630 12/10/11 2345 12/10/11 1432  WBC 3.9* 4.9 5.6  HGB 9.2* 9.8* 11.6*  HCT 26.9* 27.6* 33.0*  PLT 167 168 190   BMET  Basename 12/11/11 0630 12/10/11 2345 12/10/11 1432  NA 137 -- 136  K 4.5 -- 3.3*  CL 106 -- 97  CO2 22 -- 21  GLUCOSE 80 -- 90  BUN 27* -- 35*  CREATININE 2.70* 3.08*  3.61*  CALCIUM 9.5 -- 10.8*  PHOS -- -- --   LFT  Basename 12/10/11 1432  PROT 7.6  ALBUMIN 4.6  AST 23  ALT 9  ALKPHOS 72  BILITOT 0.6  BILIDIR 0.1  IBILI 0.5   PT/INR  Basename 12/10/11 2345  LABPROT 13.3  INR 1.02   Hepatitis Panel No results found for this basename: HEPBSAG,HCVAB,HEPAIGM,HEPBIGM in the last 72 hours  Studies/Results: Dg Abd Acute W/chest  12/10/2011  *RADIOLOGY REPORT*  Clinical Data: Abdominal pain, vomiting  ACUTE ABDOMEN SERIES (ABDOMEN 2 VIEW & CHEST 1 VIEW)  Comparison: 12/08/2011 and 05/23/2011  Findings: Cardiomediastinal silhouette is stable.  Stable stent in SVC region.  No acute infiltrate or pleural effusion.  There is nonspecific nonobstructive bowel gas pattern.  Stable postsurgical changes upper abdomen and within pelvis.  No free abdominal air.  IMPRESSION: No acute disease.  No significant change.   Original Report Authenticated By: Lahoma Crocker, M.D.  Assessment/Plan:  Acute on Chronic renal failure in the setting of volume depletion. Baseline 2.1 in 09/2011  HTN  Controlled  Anemia stable gets aranesp every 4 weeks last hb .10  Bones follow uses calcitrion  Immunosuppression  Prograf, imuran and prednisone     LOS: 1 Jymir Dunaj W @TODAY @11 :56 AM

## 2011-12-12 LAB — BASIC METABOLIC PANEL
BUN: 22 mg/dL (ref 6–23)
CO2: 22 mEq/L (ref 19–32)
Calcium: 9.8 mg/dL (ref 8.4–10.5)
Chloride: 105 mEq/L (ref 96–112)
Creatinine, Ser: 2.39 mg/dL — ABNORMAL HIGH (ref 0.50–1.10)
GFR calc Af Amer: 27 mL/min — ABNORMAL LOW (ref >90)
GFR calc non Af Amer: 24 mL/min — ABNORMAL LOW (ref >90)
Glucose, Bld: 82 mg/dL (ref 70–99)
Potassium: 4 mEq/L (ref 3.5–5.1)
Sodium: 137 mEq/L (ref 135–145)

## 2011-12-12 MED ORDER — DIPHENHYDRAMINE HCL 25 MG PO CAPS: 25.0000 mg | ORAL_CAPSULE | Freq: Four times a day (QID) | ORAL | Status: DC | PRN

## 2011-12-12 MED ORDER — PROMETHAZINE HCL 25 MG PO TABS: 25.0000 mg | ORAL_TABLET | Freq: Four times a day (QID) | ORAL | Status: DC | PRN

## 2011-12-12 MED ORDER — FUROSEMIDE 40 MG PO TABS
40.0000 mg | ORAL_TABLET | Freq: Every day | ORAL | Status: DC
  Filled 2011-12-12: qty 1

## 2011-12-12 NOTE — Progress Notes (Signed)
[facility] ROUNDING NOTE   Subjective:   Interval History:none  Objective:  Vital signs in last 24 hours:  Temp:  [97.9 F (36.6 C)-98.6 F (37 C)] 97.9 F (36.6 C) (11/09 0455) Pulse Rate:  [55-101] 60  (11/09 0455) Resp:  [16-18] 18  (11/09 0455) BP: (93-118)/(56-75) 101/58 mmHg (11/09 0455) SpO2:  [96 %-99 %] 98 % (11/09 0455) Weight:  [58.65 kg (129 lb 4.8 oz)] 58.65 kg (129 lb 4.8 oz) (11/08 2050)  Weight change: 0.35 kg (12.4 oz) Filed Weights   12/10/11 2148 12/11/11 2050  Weight: 58.3 kg (128 lb 8.5 oz) 58.65 kg (129 lb 4.8 oz)    Intake/Output: I/O last 3 completed shifts: In: 1791.7 [P.O.:720; I.V.:1071.7] Out: -    Intake/Output this shift:  Total I/O In: 240 [P.O.:240] Out: -   CVS- RRR RS- CTA ABD- BS present soft non-distended EXT- no edema   Basic Metabolic Panel:  Lab 0000000 0600 12/11/11 0630 12/10/11 2345 12/10/11 1432 12/08/11 1035  NA 137 137 -- 136 138  K 4.0 4.5 -- 3.3* 3.0*  CL 105 106 -- 97 100  CO2 22 22 -- 21 26  GLUCOSE 82 80 -- 90 85  BUN 22 27* -- 35* 36*  CREATININE 2.39* 2.70* 3.08* 3.61* 2.62*  CALCIUM 9.8 9.5 -- 10.8* --  MG -- -- -- -- --  PHOS -- -- -- -- --    Liver Function Tests:  Lab 12/10/11 1432 12/08/11 1035  AST 23 16  ALT 9 9  ALKPHOS 72 73  BILITOT 0.6 0.4  PROT 7.6 7.2  ALBUMIN 4.6 4.4    Lab 12/10/11 1432  LIPASE 35  AMYLASE --   No results found for this basename: AMMONIA:3 in the last 168 hours  CBC:  Lab 12/11/11 0630 12/10/11 2345 12/10/11 1432 12/08/11 1035  WBC 3.9* 4.9 5.6 4.6  NEUTROABS -- -- 4.3 3.6  HGB 9.2* 9.8* 11.6* 10.4*  HCT 26.9* 27.6* 33.0* 29.2*  MCV 88.5 87.3 86.6 86.9  PLT 167 168 190 182    Cardiac Enzymes: No results found for this basename: CKTOTAL:5,CKMB:5,CKMBINDEX:5,TROPONINI:5 in the last 168 hours  BNP: No components found with this basename: POCBNP:5  CBG: No results found for this basename: GLUCAP:5 in the last 168  hours  Microbiology: Results for orders placed during the hospital encounter of 12/10/11  URINE CULTURE     Status: Normal   Collection Time   12/10/11  3:23 PM      Component Value Range Status Comment   Specimen Description URINE, CLEAN CATCH   Final    Special Requests NONE   Final    Culture  Setup Time 12/10/2011 16:42   Final    Colony Count NO GROWTH   Final    Culture NO GROWTH   Final    Report Status 12/11/2011 FINAL   Final     Coagulation Studies:  Basename 12/10/11 2345  LABPROT 13.3  INR 1.02    Urinalysis:  Basename 12/10/11 1523  COLORURINE YELLOW  LABSPEC 1.016  PHURINE 5.5  GLUCOSEU NEGATIVE  HGBUR NEGATIVE  BILIRUBINUR SMALL*  KETONESUR 15*  PROTEINUR 30*  UROBILINOGEN 0.2  NITRITE NEGATIVE  LEUKOCYTESUR SMALL*      Imaging: Dg Abd Acute W/chest  12/10/2011  *RADIOLOGY REPORT*  Clinical Data: Abdominal pain, vomiting  ACUTE ABDOMEN SERIES (ABDOMEN 2 VIEW & CHEST 1 VIEW)  Comparison: 12/08/2011 and 05/23/2011  Findings: Cardiomediastinal silhouette is stable.  Stable stent in SVC region.  No acute infiltrate or pleural effusion.  There is nonspecific nonobstructive bowel gas pattern.  Stable postsurgical changes upper abdomen and within pelvis.  No free abdominal air.  IMPRESSION: No acute disease.  No significant change.   Original Report Authenticated By: Lahoma Crocker, M.D.      Medications:      . [EXPIRED] sodium chloride 50 mL/hr at 12/11/11 0844      . aspirin EC  81 mg Oral Daily  . azaTHIOprine  100 mg Oral Daily  . calcitRIOL  0.25 mcg Oral Daily  . [COMPLETED] diphenhydrAMINE  25 mg Oral Once  . feeding supplement  237 mL Oral Q24H  . furosemide  40 mg Oral Daily  . heparin  5,000 Units Subcutaneous Q8H  . metoCLOPramide  5 mg Oral QHS  . midodrine  10 mg Oral BID WC  . multivitamin with minerals  1 tablet Oral Daily  . pantoprazole  40 mg Oral Daily  . predniSONE  5 mg Oral Daily  . tacrolimus  2 mg Oral QHS  . tacrolimus  3  mg Oral Daily   albuterol, ALPRAZolam, diphenhydrAMINE, guaiFENesin-dextromethorphan, HYDROcodone-acetaminophen, HYDROmorphone (DILAUDID) injection, ondansetron (ZOFRAN) IV, promethazine  Assessment/ Plan:   Creatinine back to baseline  HTn Controlled  Anemia stable  Bones stable  Electrolytes stabl  Acute on CRF with chronic allograft nephropathy will sign off   LOS: 2 Nghia Mcentee W @TODAY @9 :48 AM

## 2012-01-07 ENCOUNTER — Other Ambulatory Visit (HOSPITAL_COMMUNITY): Payer: Self-pay

## 2012-01-08 ENCOUNTER — Encounter (HOSPITAL_COMMUNITY)
Admission: RE | Admit: 2012-01-08 | Discharge: 2012-01-08 | Disposition: A | Discharge status: Home / Self Care | Payer: Medicaid Other | Source: Ambulatory Visit | Attending: Nephrology | Admitting: Nephrology

## 2012-01-08 DIAGNOSIS — N183 Chronic kidney disease, stage 3 unspecified: Secondary | ICD-10-CM | POA: Insufficient documentation

## 2012-01-08 DIAGNOSIS — D638 Anemia in other chronic diseases classified elsewhere: Secondary | ICD-10-CM | POA: Insufficient documentation

## 2012-01-08 LAB — POCT HEMOGLOBIN-HEMACUE: Hemoglobin: 10.6 g/dL — ABNORMAL LOW (ref 12.0–15.0)

## 2012-01-08 MED ORDER — DARBEPOETIN ALFA-POLYSORBATE 60 MCG/0.3ML IJ SOLN
INTRAMUSCULAR | Status: AC
  Administered 2012-01-08: 60 ug via SUBCUTANEOUS
  Filled 2012-01-08: qty 0.3

## 2012-01-14 ENCOUNTER — Encounter (HOSPITAL_COMMUNITY): Payer: Self-pay | Admitting: Adult Health

## 2012-01-14 ENCOUNTER — Emergency Department (HOSPITAL_COMMUNITY)
Admission: EM | Admit: 2012-01-14 | Discharge: 2012-01-15 | Disposition: A | Discharge status: Home / Self Care | Payer: Medicaid Other | Attending: Emergency Medicine | Admitting: Emergency Medicine

## 2012-01-14 DIAGNOSIS — F3289 Other specified depressive episodes: Secondary | ICD-10-CM | POA: Insufficient documentation

## 2012-01-14 DIAGNOSIS — Z992 Dependence on renal dialysis: Secondary | ICD-10-CM | POA: Insufficient documentation

## 2012-01-14 DIAGNOSIS — Z8739 Personal history of other diseases of the musculoskeletal system and connective tissue: Secondary | ICD-10-CM | POA: Insufficient documentation

## 2012-01-14 DIAGNOSIS — Z9001 Acquired absence of eye: Secondary | ICD-10-CM | POA: Insufficient documentation

## 2012-01-14 DIAGNOSIS — R112 Nausea with vomiting, unspecified: Secondary | ICD-10-CM | POA: Insufficient documentation

## 2012-01-14 DIAGNOSIS — I69998 Other sequelae following unspecified cerebrovascular disease: Secondary | ICD-10-CM | POA: Insufficient documentation

## 2012-01-14 DIAGNOSIS — R5381 Other malaise: Secondary | ICD-10-CM | POA: Insufficient documentation

## 2012-01-14 DIAGNOSIS — R197 Diarrhea, unspecified: Secondary | ICD-10-CM | POA: Insufficient documentation

## 2012-01-14 DIAGNOSIS — Z87891 Personal history of nicotine dependence: Secondary | ICD-10-CM | POA: Insufficient documentation

## 2012-01-14 DIAGNOSIS — Z8679 Personal history of other diseases of the circulatory system: Secondary | ICD-10-CM | POA: Insufficient documentation

## 2012-01-14 DIAGNOSIS — Z79899 Other long term (current) drug therapy: Secondary | ICD-10-CM | POA: Insufficient documentation

## 2012-01-14 DIAGNOSIS — N289 Disorder of kidney and ureter, unspecified: Secondary | ICD-10-CM

## 2012-01-14 DIAGNOSIS — M545 Low back pain, unspecified: Secondary | ICD-10-CM | POA: Insufficient documentation

## 2012-01-14 DIAGNOSIS — G8929 Other chronic pain: Secondary | ICD-10-CM | POA: Insufficient documentation

## 2012-01-14 DIAGNOSIS — R5383 Other fatigue: Secondary | ICD-10-CM

## 2012-01-14 DIAGNOSIS — Z8719 Personal history of other diseases of the digestive system: Secondary | ICD-10-CM | POA: Insufficient documentation

## 2012-01-14 DIAGNOSIS — Z7982 Long term (current) use of aspirin: Secondary | ICD-10-CM | POA: Insufficient documentation

## 2012-01-14 DIAGNOSIS — Z8619 Personal history of other infectious and parasitic diseases: Secondary | ICD-10-CM | POA: Insufficient documentation

## 2012-01-14 DIAGNOSIS — N186 End stage renal disease: Secondary | ICD-10-CM | POA: Insufficient documentation

## 2012-01-14 DIAGNOSIS — F329 Major depressive disorder, single episode, unspecified: Secondary | ICD-10-CM | POA: Insufficient documentation

## 2012-01-14 DIAGNOSIS — Z8669 Personal history of other diseases of the nervous system and sense organs: Secondary | ICD-10-CM | POA: Insufficient documentation

## 2012-01-14 DIAGNOSIS — H539 Unspecified visual disturbance: Secondary | ICD-10-CM | POA: Insufficient documentation

## 2012-01-14 DIAGNOSIS — Z940 Kidney transplant status: Secondary | ICD-10-CM

## 2012-01-14 DIAGNOSIS — R531 Weakness: Secondary | ICD-10-CM

## 2012-01-14 DIAGNOSIS — K219 Gastro-esophageal reflux disease without esophagitis: Secondary | ICD-10-CM | POA: Insufficient documentation

## 2012-01-14 DIAGNOSIS — Z862 Personal history of diseases of the blood and blood-forming organs and certain disorders involving the immune mechanism: Secondary | ICD-10-CM | POA: Insufficient documentation

## 2012-01-14 NOTE — ED Notes (Addendum)
Presents with generalized weakness and fatigue. She states she is so tired she can barely stand up or do anything. Ongoing for one week.  Denies urinary symptoms. C/o nausea and "gastroparesis" Hx of kidney transplant with an declining renal function, found out her creatinine was 3.1 today.

## 2012-01-14 NOTE — Discharge Summary (Signed)
Physician Discharge Summary  Patient ID: Tina Watkins MRN: [id number] DOB/AGE: 1965-12-09 46 y.o.  Admit date: 12/10/2011 Discharge date: 12/12/2011  Primary Care Physician:  [name], MD   Discharge Diagnoses:    Principal Problem:  *Acute on chronic kidney failure  S/P renal transplant  CKD 3-4  Gastroparesis  NAUSEA AND VOMITING  Anxiety  Anemia of chronic disease  Chronic back pain       Medication List     As of 01/14/2012  7:23 PM    TAKE these medications         ALPRAZolam 0.25 MG tablet   Commonly known as: XANAX   Take 0.25 mg by mouth 2 (two) times daily as needed. For anxiety      aspirin EC 81 MG tablet   Take 81 mg by mouth daily.      azaTHIOprine 50 MG tablet   Commonly known as: IMURAN   Take 100 mg by mouth daily.      calcitRIOL 0.25 MCG capsule   Commonly known as: ROCALTROL   Take 0.25 mcg by mouth daily.      colchicine 0.6 MG tablet   Take 0.6 mg by mouth daily as needed. gout      diphenhydrAMINE 25 mg capsule   Commonly known as: BENADRYL   Take 1 capsule (25 mg total) by mouth every 6 (six) hours as needed for itching.      epoetin alfa 10000 UNIT/ML injection   Commonly known as: EPOGEN,PROCRIT   Inject 40,000 Units into the skin every 30 (thirty) days. Takes injection the 1st Friday of every month      furosemide 40 MG tablet   Commonly known as: LASIX   Take 40 mg by mouth 2 (two) times daily as needed. Takes 40 mg daily and an additional 40 mg later in day if needed for swelling      metoCLOPramide 5 MG tablet   Commonly known as: REGLAN   Take 5 mg by mouth at bedtime.      midodrine 10 MG tablet   Commonly known as: PROAMATINE   Take 10 mg by mouth 2 (two) times daily.      mineral oil enema   Place 1 enema rectally once.      multivitamin with minerals Tabs   Take 1 tablet by mouth daily.      omeprazole 40 MG capsule   Commonly known as: PRILOSEC   Take 40 mg by mouth 2 (two) times daily.       Oxycodone HCl 10 MG Tabs   Take 10 mg by mouth 4 (four) times daily as needed. For pain      oxyCODONE-acetaminophen 10-650 MG per tablet   Commonly known as: PERCOCET   Take 1 tablet by mouth every 6 (six) hours as needed. For pain.      predniSONE 5 MG tablet   Commonly known as: DELTASONE   Take 5 mg by mouth daily.      promethazine 25 MG tablet   Commonly known as: PHENERGAN   Take 25 mg by mouth every 6 (six) hours as needed. For nausea        promethazine 25 MG tablet   Commonly known as: PHENERGAN   Take 1 tablet (25 mg total) by mouth every 6 (six) hours as needed.      tacrolimus 1 MG capsule   Commonly known as: PROGRAF   Take 2-3 mg by mouth 2 (two) times daily.  Takes 3 mg in the morning and 2 mg at night         Disposition and Follow-up:  Dr.Colodonato in 2 weeks Bmet in 1 week  Consults:Dr. Justin Mend, Renal   Significant Diagnostic Studies:  No results found.  Brief H and P: Tina Watkins is a 46 y.o. female, with H/O Renal Transplant, Gateroperesis, Gout, who has been having N&V for the last 1 week or so, she initially came to the ER yesterday where she had a CT abdomen pelvis which was essentially unremarkable except for some bladder thickening, she said that she had been constipated but yesterday in the ER she received an enema with good results, however she continued to have some nausea vomiting along with generalized abdominal discomfort due to excessive vomiting for which she repairs and it herself to the ER today, in the ER today after receiving supportive care she felt a lot better and was able to keep her diet down, however on lab work she showed some acute renal insufficiency due to dehydration, case was discussed by the ER physician with Dr. Mercy Moore nephrologist who requested that patient be admitted to the hospital for further monitoring of her renal function and hydration    Hospital Course:  1. Nausea vomiting causing dehydration prerenal  azotemia and acute on chronic renal failure in a renal transplant patient with gastroparesis -  CT abdomen pelvis from 11/5 and KUB 11/7 stable,  Improved, treated with supportive care, Reglan,  IVF, held her Lasix and potassium,  Advanced diet    2. Back pain: somewhat chronic, recent CT without pyelonephritis  [facility], PT  2. Renal failure status post renal transplant.  Improved, close to baseline, lasix was on hold this was resumed at discharge. 3. History of anxiety. No acute issues continue her when necessary Xanax.  4. Borderline UA. Monitor urine cultures.  5. Normocytic anemia: likely from CKD, no overt blood loss      Time spent on Discharge: 55min  Signed: Meliya Mcconahy [facility]  01/14/2012, 7:23 PM

## 2012-01-15 ENCOUNTER — Emergency Department (HOSPITAL_COMMUNITY): Payer: Medicaid Other

## 2012-01-15 LAB — CBC WITH DIFFERENTIAL/PLATELET
Basophils Absolute: 0 10*3/uL (ref 0.0–0.1)
Basophils Relative: 0 % (ref 0–1)
Eosinophils Absolute: 0.1 10*3/uL (ref 0.0–0.7)
Eosinophils Relative: 1 % (ref 0–5)
HCT: 30.1 % — ABNORMAL LOW (ref 36.0–46.0)
Hemoglobin: 10.4 g/dL — ABNORMAL LOW (ref 12.0–15.0)
Lymphocytes Relative: 8 % — ABNORMAL LOW (ref 12–46)
Lymphs Abs: 0.4 10*3/uL — ABNORMAL LOW (ref 0.7–4.0)
MCH: 30.9 pg (ref 26.0–34.0)
MCHC: 34.6 g/dL (ref 30.0–36.0)
MCV: 89.3 fL (ref 78.0–100.0)
Monocytes Absolute: 0.4 10*3/uL (ref 0.1–1.0)
Monocytes Relative: 7 % (ref 3–12)
Neutro Abs: 4.3 10*3/uL (ref 1.7–7.7)
Neutrophils Relative %: 84 % — ABNORMAL HIGH (ref 43–77)
Platelets: 200 10*3/uL (ref 150–400)
RBC: 3.37 MIL/uL — ABNORMAL LOW (ref 3.87–5.11)
RDW: 17.9 % — ABNORMAL HIGH (ref 11.5–15.5)
WBC: 5.2 10*3/uL (ref 4.0–10.5)

## 2012-01-15 LAB — URINALYSIS, ROUTINE W REFLEX MICROSCOPIC
Bilirubin Urine: NEGATIVE
Glucose, UA: NEGATIVE mg/dL
Hgb urine dipstick: NEGATIVE
Ketones, ur: NEGATIVE mg/dL
Leukocytes, UA: NEGATIVE
Nitrite: NEGATIVE
Protein, ur: NEGATIVE mg/dL
Specific Gravity, Urine: 1.01 (ref 1.005–1.030)
Urobilinogen, UA: 0.2 mg/dL (ref 0.0–1.0)
pH: 5.5 (ref 5.0–8.0)

## 2012-01-15 LAB — COMPREHENSIVE METABOLIC PANEL
ALT: 9 U/L (ref 0–35)
AST: 17 U/L (ref 0–37)
Albumin: 4.3 g/dL (ref 3.5–5.2)
Alkaline Phosphatase: 67 U/L (ref 39–117)
BUN: 29 mg/dL — ABNORMAL HIGH (ref 6–23)
CO2: 24 mEq/L (ref 19–32)
Calcium: 10.4 mg/dL (ref 8.4–10.5)
Chloride: 99 mEq/L (ref 96–112)
Creatinine, Ser: 2.67 mg/dL — ABNORMAL HIGH (ref 0.50–1.10)
GFR calc Af Amer: 24 mL/min — ABNORMAL LOW (ref >90)
GFR calc non Af Amer: 21 mL/min — ABNORMAL LOW (ref >90)
Glucose, Bld: 97 mg/dL (ref 70–99)
Potassium: 4 mEq/L (ref 3.5–5.1)
Sodium: 136 mEq/L (ref 135–145)
Total Bilirubin: 0.4 mg/dL (ref 0.3–1.2)
Total Protein: 7 g/dL (ref 6.0–8.3)

## 2012-01-15 NOTE — ED Provider Notes (Addendum)
History     CSN: [id number]  Arrival date & time 01/14/12  2330   First MD Initiated Contact with Patient 01/14/12 2349      Chief Complaint  Patient presents with  . Weakness    (Consider location/radiation/quality/duration/timing/severity/associated sxs/prior treatment) HPI 46 year old female with generalized weaknss and fatigue worsening over the last several weeks.  Admitted for same about a month ago. Patient has had nausea vomiting and diarrhea over the weekend, seen by her nephrologist yesterday and had labs drawn. She was called today and told that her creatinine had elevated to 3.1 from a baseline of 2.5. Patient denies any chest pain. She is drinking and tolerating fluids now. Patient is concerned that her creatinine is worsening and wants to get checked out. She denies any fever chills, dysuria.    Past Medical History  Diagnosis Date  . Gastroparesis   . Gout   . Hypotension   . Herpes genitalia 1994  . E coli bacteremia 06/18/2011  . Bacteremia due to Gram-negative bacteria 05/23/2011  . ESRD (end stage renal disease) 06/12/2011  . Complication of anesthesia     "woke up during OR; I have an extremely high tolerance" (12/11/2011)  . Dysrhythmia     "tachycardia" (12/11/2011)  . Iron deficiency anemia   . History of blood transfusion     "more than a few times" (12/11/2011)  . History of stomach ulcers   . GERD (gastroesophageal reflux disease)   . Headache     "not often anymore" (12/11/2011)  . Seizures 1994    "post transplant; only have had that one" (12/11/2011)  . Stroke      left basal ganglia lacunar infarct; Right frontal lobe lacunar infarct.  . Stroke ~ 1999; 2001    "briefly lost my vision; lost my right eye" (12/11/2011)  . Chronic lower back pain   . Depression   . Osteopenia     Past Surgical History  Procedure Date  . Kidney transplant 1994; 1999; 2005    "right"  . Appendectomy ~ 2004  . Enucleation 2001    "right"  . 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 Total nephrectomy 1988?; 1994; 2005  . Multiple tooth extractions     Family History  Problem Relation Age of Onset  . Hypertension Maternal Grandmother     History  Substance Use Topics  . Smoking status: Former Smoker -- 0.5 packs/day for 28 years    Types: Cigarettes    Quit date: 03/05/2011  . Smokeless tobacco: Never Used  . Alcohol Use: Yes     Comment: 12/11/2011 "rarely have a mixed drink"    OB History    Grav Para Term Preterm Abortions TAB SAB Ect Mult Living                  Review of Systems  See History of Present Illness; otherwise all other systems are reviewed and negative  Allergies  Levofloxacin  Home Medications   Current Outpatient Rx  Name  Route  Sig  Dispense  Refill  . ALPRAZOLAM 0.25 MG PO TABS   Oral   Take 0.25 mg by mouth 2 (two) times daily as needed. For anxiety         . ASPIRIN EC 81 MG PO TBEC   Oral   Take 81 mg by mouth daily.         [name]  AZATHIOPRINE 50 MG PO TABS   Oral   Take 100 mg by mouth daily.         [name] CALCITRIOL 0.25 MCG PO CAPS   Oral   Take 0.25 mcg by mouth daily.         . COLCHICINE 0.6 MG PO TABS   Oral   Take 0.6 mg by mouth daily as needed. gout         . EPOETIN ALFA 29562 UNIT/ML IJ SOLN   Subcutaneous   Inject 40,000 Units into the skin every 30 (thirty) days. Takes injection the 1st Friday of every month         . FUROSEMIDE 40 MG PO TABS   Oral   Take 40 mg by mouth 2 (two) times daily as needed. Takes 40 mg daily and an additional 40 mg later in day if needed for swelling         . METOCLOPRAMIDE HCL 5 MG PO TABS   Oral   Take 5 mg by mouth at bedtime.         [name] MIDODRINE HCL 10 MG PO TABS   Oral   Take 10 mg by mouth 2 (two) times daily.         . ADULT MULTIVITAMIN W/MINERALS CH   Oral   Take 1 tablet by mouth daily.         [name] OMEPRAZOLE 40 MG PO CPDR   Oral   Take 40 mg by mouth 2  (two) times daily.          . OXYCODONE HCL 10 MG PO TABS   Oral   Take 10 mg by mouth 4 (four) times daily as needed. For pain         . OXYCODONE-ACETAMINOPHEN 10-650 MG PO TABS   Oral   Take 1 tablet by mouth every 6 (six) hours as needed. For pain.         [name] PREDNISONE 5 MG PO TABS   Oral   Take 5 mg by mouth daily.         [name] PROMETHAZINE HCL 25 MG PO TABS   Oral   Take 25 mg by mouth every 6 (six) hours as needed. For nausea          . TACROLIMUS 1 MG PO CAPS   Oral   Take 2-3 mg by mouth 2 (two) times daily. Takes 3 mg in the morning and 2 mg at night           BP 110/76  Pulse 61  Temp 98.1 F (36.7 C) (Oral)  Resp 20  SpO2 100%  Physical Exam  Nursing note and vitals reviewed. Constitutional:       Patient appears chronically ill, fatigued  HENT:  Head: Normocephalic and atraumatic.  Nose: Nose normal.  Mouth/Throat: Oropharynx is clear and moist.  Eyes:       Patient is blind in the right eye. Proptosis noted to left eye, generalized periorbital edema  Neck: Normal range of motion. Neck supple. No JVD present. No tracheal deviation present. No thyromegaly present.  Cardiovascular: Normal rate, regular rhythm, normal heart sounds and intact distal pulses.  Exam reveals no gallop and no friction rub.   No murmur heard. Pulmonary/Chest: Effort normal and breath sounds normal. No stridor. No respiratory distress. She has no wheezes. She has no rales. She exhibits no tenderness.  Abdominal: Soft. Bowel sounds are normal. She exhibits no distension and no mass. There  is no tenderness. There is no rebound and no guarding.  Musculoskeletal: Normal range of motion. She exhibits no edema and no tenderness.  Lymphadenopathy:    She has no cervical adenopathy.  Skin: Skin is warm and dry. No rash noted. No erythema. No pallor.    ED Course  Procedures (including critical care time)  Labs Reviewed  CBC WITH DIFFERENTIAL - Abnormal; Notable for the  following:    RBC 3.37 (*)     Hemoglobin 10.4 (*)     HCT 30.1 (*)     RDW 17.9 (*)     Neutrophils Relative 84 (*)     Lymphocytes Relative 8 (*)     Lymphs Abs 0.4 (*)     All other components within normal limits  COMPREHENSIVE METABOLIC PANEL - Abnormal; Notable for the following:    BUN 29 (*)     Creatinine, Ser 2.67 (*)     GFR calc non Af Amer 21 (*)     GFR calc Af Amer 24 (*)     All other components within normal limits  URINALYSIS, ROUTINE W REFLEX MICROSCOPIC   Dg Chest 2 View  01/15/2012  *RADIOLOGY REPORT*  Clinical Data: Generalized weakness and fatigue.  Nausea.  CHEST - 2 VIEW  Comparison: 12/10/2011  Findings: Elevation of the right hemidiaphragm with segmental elevation of the left hemidiaphragm.  Normal heart size and pulmonary vascularity.  Vascular stent in the right mediastinum. No focal consolidation.  No blunting of costophrenic angles.  No pneumothorax.  Postoperative changes in the upper abdomen.  No significant change since previous study.  IMPRESSION: No evidence of active pulmonary disease.   Original Report Authenticated By: Lucienne Capers, M.D.     Date: 01/15/2012  Rate: 59  Rhythm: normal sinus rhythm  QRS Axis: normal  Intervals: normal  ST/T Wave abnormalities: early repolarization  Conduction Disutrbances:none  Narrative Interpretation:   Old EKG Reviewed: unchanged    1. History of renal transplantation   2. Renal insufficiency   3. Weakness   4. Fatigue       MDM  Patient with history of kidney failure status post 3 transplants with currently declining renal function in her most recent transplant. Creatinine in the office 3.1 here 2.67 which is not far from her baseline. She is tolerating by mouth. She does not wish to be admitted at this time. She reports she has followup with her nephrologist tomorrow.            Kalman Drape, MD 01/15/12 [id number]  Kalman Drape, MD 01/15/12 [phone]

## 2012-01-15 NOTE — ED Notes (Signed)
Pt aware urine is needed. Unable to go at this time. Will check back again

## 2012-02-12 ENCOUNTER — Encounter (HOSPITAL_COMMUNITY): Payer: Medicaid Other

## 2012-02-15 ENCOUNTER — Encounter (HOSPITAL_COMMUNITY)
Admission: RE | Admit: 2012-02-15 | Discharge: 2012-02-15 | Disposition: A | Discharge status: Home / Self Care | Payer: Medicaid Other | Source: Ambulatory Visit | Attending: Nephrology | Admitting: Nephrology

## 2012-02-15 DIAGNOSIS — N183 Chronic kidney disease, stage 3 unspecified: Secondary | ICD-10-CM | POA: Insufficient documentation

## 2012-02-15 DIAGNOSIS — D638 Anemia in other chronic diseases classified elsewhere: Secondary | ICD-10-CM | POA: Insufficient documentation

## 2012-02-15 LAB — RENAL FUNCTION PANEL
Albumin: 4.3 g/dL (ref 3.5–5.2)
BUN: 32 mg/dL — ABNORMAL HIGH (ref 6–23)
CO2: 25 mEq/L (ref 19–32)
Calcium: 10 mg/dL (ref 8.4–10.5)
Chloride: 104 mEq/L (ref 96–112)
Creatinine, Ser: 2.48 mg/dL — ABNORMAL HIGH (ref 0.50–1.10)
GFR calc Af Amer: 26 mL/min — ABNORMAL LOW (ref >90)
GFR calc non Af Amer: 23 mL/min — ABNORMAL LOW (ref >90)
Glucose, Bld: 71 mg/dL (ref 70–99)
Phosphorus: 2.8 mg/dL (ref 2.3–4.6)
Potassium: 3.5 mEq/L (ref 3.5–5.1)
Sodium: 142 mEq/L (ref 135–145)

## 2012-02-15 LAB — IRON AND TIBC
Iron: 88 ug/dL (ref 42–135)
Saturation Ratios: 43 % (ref 20–55)
TIBC: 207 ug/dL — ABNORMAL LOW (ref 250–470)
UIBC: 119 ug/dL — ABNORMAL LOW (ref 125–400)

## 2012-02-15 LAB — FERRITIN: Ferritin: 1415 ng/mL — ABNORMAL HIGH (ref 10–291)

## 2012-02-15 LAB — POCT HEMOGLOBIN-HEMACUE: Hemoglobin: 9.4 g/dL — ABNORMAL LOW (ref 12.0–15.0)

## 2012-02-15 MED ORDER — DARBEPOETIN ALFA-POLYSORBATE 60 MCG/0.3ML IJ SOLN
60.0000 ug | INTRAMUSCULAR | Status: DC
  Administered 2012-02-15: 60 ug via SUBCUTANEOUS

## 2012-02-15 MED ORDER — DARBEPOETIN ALFA-POLYSORBATE 60 MCG/0.3ML IJ SOLN
INTRAMUSCULAR | Status: AC
  Filled 2012-02-15: qty 0.3

## 2012-02-24 ENCOUNTER — Ambulatory Visit
Admission: RE | Admit: 2012-02-24 | Discharge: 2012-02-24 | Disposition: A | Discharge status: Home / Self Care | Payer: Medicaid Other | Source: Ambulatory Visit | Attending: Nephrology | Admitting: Nephrology

## 2012-02-24 DIAGNOSIS — N6489 Other specified disorders of breast: Secondary | ICD-10-CM

## 2012-03-11 ENCOUNTER — Encounter (HOSPITAL_COMMUNITY)
Admission: RE | Admit: 2012-03-11 | Discharge: 2012-03-11 | Disposition: A | Discharge status: Home / Self Care | Payer: Medicaid Other | Source: Ambulatory Visit | Attending: Nephrology | Admitting: Nephrology

## 2012-03-11 DIAGNOSIS — D638 Anemia in other chronic diseases classified elsewhere: Secondary | ICD-10-CM | POA: Insufficient documentation

## 2012-03-11 DIAGNOSIS — N183 Chronic kidney disease, stage 3 unspecified: Secondary | ICD-10-CM | POA: Insufficient documentation

## 2012-03-11 LAB — RENAL FUNCTION PANEL
Albumin: 4.1 g/dL (ref 3.5–5.2)
BUN: 32 mg/dL — ABNORMAL HIGH (ref 6–23)
CO2: 22 mEq/L (ref 19–32)
Calcium: 10 mg/dL (ref 8.4–10.5)
Chloride: 102 mEq/L (ref 96–112)
Creatinine, Ser: 2.6 mg/dL — ABNORMAL HIGH (ref 0.50–1.10)
GFR calc Af Amer: 25 mL/min — ABNORMAL LOW (ref >90)
GFR calc non Af Amer: 21 mL/min — ABNORMAL LOW (ref >90)
Glucose, Bld: 101 mg/dL — ABNORMAL HIGH (ref 70–99)
Phosphorus: 3.4 mg/dL (ref 2.3–4.6)
Potassium: 3.5 mEq/L (ref 3.5–5.1)
Sodium: 139 mEq/L (ref 135–145)

## 2012-03-11 LAB — POCT HEMOGLOBIN-HEMACUE: Hemoglobin: 10.2 g/dL — ABNORMAL LOW (ref 12.0–15.0)

## 2012-03-11 MED ORDER — DARBEPOETIN ALFA-POLYSORBATE 60 MCG/0.3ML IJ SOLN
60.0000 ug | INTRAMUSCULAR | Status: DC
  Administered 2012-03-11: 60 ug via SUBCUTANEOUS

## 2012-03-11 MED ORDER — DARBEPOETIN ALFA-POLYSORBATE 60 MCG/0.3ML IJ SOLN
INTRAMUSCULAR | Status: AC
  Administered 2012-03-11: 60 ug via SUBCUTANEOUS
  Filled 2012-03-11: qty 0.3

## 2012-03-21 ENCOUNTER — Telehealth (HOSPITAL_COMMUNITY): Payer: Self-pay | Admitting: *Deleted

## 2012-03-21 ENCOUNTER — Encounter (HOSPITAL_COMMUNITY): Payer: Self-pay | Admitting: *Deleted

## 2012-03-21 ENCOUNTER — Emergency Department (HOSPITAL_COMMUNITY): Payer: Medicaid Other

## 2012-03-21 ENCOUNTER — Emergency Department (HOSPITAL_COMMUNITY)
Admission: EM | Admit: 2012-03-21 | Discharge: 2012-03-21 | Disposition: A | Discharge status: Home / Self Care | Payer: Medicaid Other | Attending: Emergency Medicine | Admitting: Emergency Medicine

## 2012-03-21 DIAGNOSIS — Z8679 Personal history of other diseases of the circulatory system: Secondary | ICD-10-CM | POA: Insufficient documentation

## 2012-03-21 DIAGNOSIS — Z8619 Personal history of other infectious and parasitic diseases: Secondary | ICD-10-CM | POA: Insufficient documentation

## 2012-03-21 DIAGNOSIS — Z79899 Other long term (current) drug therapy: Secondary | ICD-10-CM | POA: Insufficient documentation

## 2012-03-21 DIAGNOSIS — F329 Major depressive disorder, single episode, unspecified: Secondary | ICD-10-CM | POA: Insufficient documentation

## 2012-03-21 DIAGNOSIS — H539 Unspecified visual disturbance: Secondary | ICD-10-CM | POA: Insufficient documentation

## 2012-03-21 DIAGNOSIS — Z862 Personal history of diseases of the blood and blood-forming organs and certain disorders involving the immune mechanism: Secondary | ICD-10-CM | POA: Insufficient documentation

## 2012-03-21 DIAGNOSIS — I959 Hypotension, unspecified: Secondary | ICD-10-CM | POA: Insufficient documentation

## 2012-03-21 DIAGNOSIS — F3289 Other specified depressive episodes: Secondary | ICD-10-CM | POA: Insufficient documentation

## 2012-03-21 DIAGNOSIS — N186 End stage renal disease: Secondary | ICD-10-CM | POA: Insufficient documentation

## 2012-03-21 DIAGNOSIS — Z8711 Personal history of peptic ulcer disease: Secondary | ICD-10-CM | POA: Insufficient documentation

## 2012-03-21 DIAGNOSIS — Z87891 Personal history of nicotine dependence: Secondary | ICD-10-CM | POA: Insufficient documentation

## 2012-03-21 DIAGNOSIS — K219 Gastro-esophageal reflux disease without esophagitis: Secondary | ICD-10-CM | POA: Insufficient documentation

## 2012-03-21 DIAGNOSIS — I69998 Other sequelae following unspecified cerebrovascular disease: Secondary | ICD-10-CM | POA: Insufficient documentation

## 2012-03-21 DIAGNOSIS — Z7982 Long term (current) use of aspirin: Secondary | ICD-10-CM | POA: Insufficient documentation

## 2012-03-21 DIAGNOSIS — Z8639 Personal history of other endocrine, nutritional and metabolic disease: Secondary | ICD-10-CM | POA: Insufficient documentation

## 2012-03-21 DIAGNOSIS — R1110 Vomiting, unspecified: Secondary | ICD-10-CM | POA: Insufficient documentation

## 2012-03-21 DIAGNOSIS — K3184 Gastroparesis: Secondary | ICD-10-CM | POA: Insufficient documentation

## 2012-03-21 DIAGNOSIS — Z8739 Personal history of other diseases of the musculoskeletal system and connective tissue: Secondary | ICD-10-CM | POA: Insufficient documentation

## 2012-03-21 DIAGNOSIS — Z8669 Personal history of other diseases of the nervous system and sense organs: Secondary | ICD-10-CM | POA: Insufficient documentation

## 2012-03-21 LAB — COMPREHENSIVE METABOLIC PANEL
ALT: 9 U/L (ref 0–35)
AST: 28 U/L (ref 0–37)
Albumin: 4.7 g/dL (ref 3.5–5.2)
Alkaline Phosphatase: 66 U/L (ref 39–117)
BUN: 32 mg/dL — ABNORMAL HIGH (ref 6–23)
CO2: 23 mEq/L (ref 19–32)
Calcium: 11 mg/dL — ABNORMAL HIGH (ref 8.4–10.5)
Chloride: 101 mEq/L (ref 96–112)
Creatinine, Ser: 1.98 mg/dL — ABNORMAL HIGH (ref 0.50–1.10)
GFR calc Af Amer: 34 mL/min — ABNORMAL LOW (ref >90)
GFR calc non Af Amer: 30 mL/min — ABNORMAL LOW (ref >90)
Glucose, Bld: 111 mg/dL — ABNORMAL HIGH (ref 70–99)
Potassium: 3.8 mEq/L (ref 3.5–5.1)
Sodium: 141 mEq/L (ref 135–145)
Total Bilirubin: 0.5 mg/dL (ref 0.3–1.2)
Total Protein: 7.6 g/dL (ref 6.0–8.3)

## 2012-03-21 LAB — CBC WITH DIFFERENTIAL/PLATELET
Basophils Absolute: 0 10*3/uL (ref 0.0–0.1)
Basophils Relative: 0 % (ref 0–1)
Eosinophils Absolute: 0 10*3/uL (ref 0.0–0.7)
Eosinophils Relative: 0 % (ref 0–5)
HCT: 30 % — ABNORMAL LOW (ref 36.0–46.0)
Hemoglobin: 10.6 g/dL — ABNORMAL LOW (ref 12.0–15.0)
Lymphocytes Relative: 8 % — ABNORMAL LOW (ref 12–46)
Lymphs Abs: 0.4 10*3/uL — ABNORMAL LOW (ref 0.7–4.0)
MCH: 31 pg (ref 26.0–34.0)
MCHC: 35.3 g/dL (ref 30.0–36.0)
MCV: 87.7 fL (ref 78.0–100.0)
Monocytes Absolute: 0.4 10*3/uL (ref 0.1–1.0)
Monocytes Relative: 8 % (ref 3–12)
Neutro Abs: 4.3 10*3/uL (ref 1.7–7.7)
Neutrophils Relative %: 84 % — ABNORMAL HIGH (ref 43–77)
Platelets: 178 10*3/uL (ref 150–400)
RBC: 3.42 MIL/uL — ABNORMAL LOW (ref 3.87–5.11)
RDW: 18.1 % — ABNORMAL HIGH (ref 11.5–15.5)
WBC: 5.1 10*3/uL (ref 4.0–10.5)

## 2012-03-21 LAB — LIPASE, BLOOD: Lipase: 36 U/L (ref 11–59)

## 2012-03-21 MED ORDER — HYDROMORPHONE HCL PF 1 MG/ML IJ SOLN
1.0000 mg | Freq: Once | INTRAMUSCULAR | Status: AC
  Administered 2012-03-21: 1 mg via INTRAVENOUS
  Filled 2012-03-21: qty 1

## 2012-03-21 MED ORDER — SODIUM CHLORIDE 0.9 % IV SOLN: INTRAVENOUS | Status: DC

## 2012-03-21 MED ORDER — LORAZEPAM 2 MG/ML IJ SOLN
1.0000 mg | Freq: Once | INTRAMUSCULAR | Status: AC
  Administered 2012-03-21: 1 mg via INTRAVENOUS
  Filled 2012-03-21: qty 1

## 2012-03-21 MED ORDER — SODIUM CHLORIDE 0.9 % IV BOLUS (SEPSIS)
1000.0000 mL | Freq: Once | INTRAVENOUS | Status: AC
  Administered 2012-03-21: 1000 mL via INTRAVENOUS

## 2012-03-21 MED ORDER — ONDANSETRON HCL 4 MG/2ML IJ SOLN
4.0000 mg | Freq: Once | INTRAMUSCULAR | Status: AC
  Administered 2012-03-21: 4 mg via INTRAVENOUS
  Filled 2012-03-21: qty 2

## 2012-03-21 NOTE — ED Notes (Signed)
Pt c/o epigastric pain and emesis ever since having epogen shot on Friday.  Per friend, this happens every time she has an epogen shot and he has to bring her here to rcv fluids and then she is sent home.

## 2012-03-21 NOTE — ED Notes (Addendum)
Rx x 0.  Pt voiced understanding to f/u with PCP and return for worsening condition.  Pt not d/c in room, no signature pad available.

## 2012-03-21 NOTE — ED Provider Notes (Signed)
History     CSN: [id number]  Arrival date & time 03/21/12  1441   First MD Initiated Contact with Patient 03/21/12 1507      Chief Complaint  Patient presents with  . Abdominal Pain  . Emesis    (Consider location/radiation/quality/duration/timing/severity/associated sxs/prior treatment) Patient is a 47 y.o. female presenting with abdominal pain and vomiting. The history is provided by the patient.  Abdominal Pain Associated symptoms: vomiting   Emesis Associated symptoms: abdominal pain    patient here with 3 days of epigastric burning pain that started after she had her Epogen shot. Patient has had many prior episodes of similar symptoms. She was not told that the cause of this is. Her emesis has been nonbilious. She denies any diarrhea or fever. Has been persistent and nothing makes it better or worse. Says use home medications without relief. No urinary symptoms.  Past Medical History  Diagnosis Date  . Gastroparesis   . Gout   . Hypotension   . Herpes genitalia 1994  . E coli bacteremia 06/18/2011  . Bacteremia due to Gram-negative bacteria 05/23/2011  . ESRD (end stage renal disease) 06/12/2011  . Complication of anesthesia     "woke up during OR; I have an extremely high tolerance" (12/11/2011)  . Dysrhythmia     "tachycardia" (12/11/2011)  . Iron deficiency anemia   . History of blood transfusion     "more than a few times" (12/11/2011)  . History of stomach ulcers   . GERD (gastroesophageal reflux disease)   . Headache     "not often anymore" (12/11/2011)  . Seizures 1994    "post transplant; only have had that one" (12/11/2011)  . Stroke      left basal ganglia lacunar infarct; Right frontal lobe lacunar infarct.  . Stroke ~ 1999; 2001    "briefly lost my vision; lost my right eye" (12/11/2011)  . Chronic lower back pain   . Depression   . Osteopenia     Past Surgical History  Procedure Laterality Date  . Kidney transplant  1994; 1999; 2005    "right"  .  Appendectomy  ~ 2004  . Enucleation  2001    "right"  . 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 Total nephrectomy  1988?; 1994; 2005  . Multiple tooth extractions      Family History  Problem Relation Age of Onset  . Hypertension Maternal Grandmother     History  Substance Use Topics  . Smoking status: Former Smoker -- 0.50 packs/day for 28 years    Types: Cigarettes    Quit date: 03/05/2011  . Smokeless tobacco: Never Used  . Alcohol Use: Yes     Comment: 12/11/2011 "rarely have a mixed drink"    OB History   Grav Para Term Preterm Abortions TAB SAB Ect Mult Living                  Review of Systems  Gastrointestinal: Positive for vomiting and abdominal pain.  All other systems reviewed and are negative.    Allergies  Levofloxacin  Home Medications   Current Outpatient Rx  Name  Route  Sig  Dispense  Refill  . ALPRAZolam (XANAX) 0.25 MG tablet   Oral   Take 0.25 mg by mouth 2 (two) times daily as needed. For anxiety         . aspirin EC 81  MG tablet   Oral   Take 81 mg by mouth daily.         [name] azaTHIOprine (IMURAN) 50 MG tablet   Oral   Take 100 mg by mouth daily.         . calcitRIOL (ROCALTROL) 0.25 MCG capsule   Oral   Take 0.25 mcg by mouth daily.         . colchicine 0.6 MG tablet   Oral   Take 0.6 mg by mouth daily as needed (For gout.).         [name] epoetin alfa (EPOGEN,PROCRIT) 57846 UNIT/ML injection   Subcutaneous   Inject 40,000 Units into the skin every 30 (thirty) days. Takes injection the 1st Friday of every month.         . furosemide (LASIX) 40 MG tablet   Oral   Take 40 mg by mouth 2 (two) times daily as needed. Takes 40 mg daily and an additional 40 mg later in day if needed for swelling         . midodrine (PROAMATINE) 10 MG tablet   Oral   Take 10 mg by mouth 2 (two) times daily.         . Multiple Vitamin (MULITIVITAMIN WITH MINERALS) TABS    Oral   Take 1 tablet by mouth daily.         [name] omeprazole (PRILOSEC) 40 MG capsule   Oral   Take 40 mg by mouth 2 (two) times daily.          . Oxycodone HCl 10 MG TABS   Oral   Take 10 mg by mouth 4 (four) times daily as needed. For pain         . oxyCODONE-acetaminophen (PERCOCET) 10-650 MG per tablet   Oral   Take 1 tablet by mouth every 6 (six) hours as needed. For pain.         . predniSONE (DELTASONE) 5 MG tablet   Oral   Take 5 mg by mouth daily.         . promethazine (PHENERGAN) 25 MG tablet   Oral   Take 25 mg by mouth every 6 (six) hours as needed. For nausea          . tacrolimus (PROGRAF) 1 MG capsule   Oral   Take 2-3 mg by mouth 2 (two) times daily. Takes 3 mg in the morning and 2 mg at night           BP 125/108  Pulse 68  Temp(Src) 98 F (36.7 C) (Oral)  Physical Exam  Nursing note and vitals reviewed. Constitutional: She is oriented to person, place, and time. She appears well-developed and well-nourished.  Non-toxic appearance. No distress.  HENT:  Head: Normocephalic and atraumatic.  Eyes: Conjunctivae, EOM and lids are normal. Pupils are equal, round, and reactive to light.  Neck: Normal range of motion. Neck supple. No tracheal deviation present. No mass present.  Cardiovascular: Normal rate, regular rhythm and normal heart sounds.  Exam reveals no gallop.   No murmur heard. Pulmonary/Chest: Effort normal and breath sounds normal. No stridor. No respiratory distress. She has no decreased breath sounds. She has no wheezes. She has no rhonchi. She has no rales.  Abdominal: Soft. Normal appearance and bowel sounds are normal. She exhibits no distension. There is tenderness in the epigastric area. There is no rigidity, no rebound, no guarding and no CVA tenderness.  Musculoskeletal: Normal range of motion. She exhibits  no edema and no tenderness.  Neurological: She is alert and oriented to person, place, and time. She has normal strength.  No cranial nerve deficit or sensory deficit. GCS eye subscore is 4. GCS verbal subscore is 5. GCS motor subscore is 6.  Skin: Skin is warm and dry. No abrasion and no rash noted.  Psychiatric: Her speech is normal. Her mood appears anxious. She is agitated.    ED Course  Procedures (including critical care time)  Labs Reviewed  CBC WITH DIFFERENTIAL  COMPREHENSIVE METABOLIC PANEL  LIPASE, BLOOD  URINALYSIS, ROUTINE W REFLEX MICROSCOPIC   No results found.   No diagnosis found.    MDM  6:20 PM Patient given IV fluids and pain medication with antibiotics and feels better. Suspect that she has gastroparesis as cause of her current symptoms. Creatinine is better than that has been for the past 4 months. She is stable for discharge        Leota Jacobsen, MD 03/21/12 1821

## 2012-03-22 ENCOUNTER — Telehealth: Payer: Self-pay | Admitting: Gastroenterology

## 2012-03-22 MED ORDER — HYDROCODONE-ACETAMINOPHEN 7.5-300 MG PO TABS: 1.0000 | ORAL_TABLET | Freq: Four times a day (QID) | ORAL | Status: DC | PRN

## 2012-03-22 NOTE — Telephone Encounter (Signed)
Pt was seen in the ER last night for vomiting that lasted 24 hours, she is now requesting narcotic pain medications.  She states she has pain in her throat, abd and back from vomiting so much.  She was given an appt for 03/29/12 with Dr Ardis Hughs but would like to have something for pain until then.  She states she can't take anything OTC because she had a kidney transplant.  Please advise

## 2012-03-22 NOTE — Telephone Encounter (Signed)
Can prescribe 20 vicodin (7.5/325), take 1-2 pills every 6 hours PRN, no refills.  Cannot prescribe more narcotics after that unless seen in office.

## 2012-03-22 NOTE — Telephone Encounter (Signed)
Pt is aware that the rx has been sent to the pharmacy for pick up and she will keep the appt

## 2012-03-29 ENCOUNTER — Ambulatory Visit (INDEPENDENT_AMBULATORY_CARE_PROVIDER_SITE_OTHER): Payer: Medicaid Other | Admitting: Gastroenterology

## 2012-03-29 ENCOUNTER — Encounter: Payer: Self-pay | Admitting: Gastroenterology

## 2012-03-29 VITALS — BP 108/62 | HR 60 | Ht 59.0 in | Wt 126.0 lb

## 2012-03-29 DIAGNOSIS — R1110 Vomiting, unspecified: Secondary | ICD-10-CM

## 2012-03-29 NOTE — Patient Instructions (Addendum)
You will be set up for an upper endoscopy for nausea, vomiting (last was about 5 years ago) at [facility] with MAC sedation. Stay on reglan 5 mg at bedtime. Start taking zofran 4mg  pill every morning after waking.                                              We are excited to introduce MyChart, a new best-in-class service that provides you online access to important information in your electronic medical record. We want to make it easier for you to view your health information - all in one secure location - when and where you need it. We expect MyChart will enhance the quality of care and service we provide.  When you register for MyChart, you can:    View your test results.    Request appointments and receive appointment reminders via email.    Request medication renewals.    View your medical history, allergies, medications and immunizations.    Communicate with your physician's office through a password-protected site.    Conveniently print information such as your medication lists.  To find out if MyChart is right for you, please talk to a member of our clinical staff today. We will gladly answer your questions about this free health and wellness tool.  If you are age 68 or older and want a member of your family to have access to your record, you must provide written consent by completing a proxy form available at our office. Please speak to our clinical staff about guidelines regarding accounts for patients younger than age 35.  As you activate your MyChart account and need any technical assistance, please call the MyChart technical support line at [phone]) or email your question to [email or web address]. If you email your question(s), please include your name, a return phone number and the best time to reach you.  If you have non-urgent health-related questions, you can send a message to our office through [place] at [email or web address]. If you have a medical  emergency, call 911.  Thank you for using MyChart as your new health and wellness resource!   MyChart licensed from [employer],  1999-2010. Patents Pending.

## 2012-03-29 NOTE — Progress Notes (Signed)
Review of pertinent gastrointestinal problems: 1. Chronic nausea, vomiting. EGD August 2009 was normal except for small hiatal hernia. Status post kidney transplant on immunosuppressive medicines which could cause nausea, narcotics periodically. Normal complete metabolic profile, slight anemia, labs July 2009. Changing her Myfortic to Imuran helped nausea temporarily, symptoms returned. GES 03/2008 slightly slow emptying. [name] 2/201normal GB, normal bilary tree. Hospitalized 03/2008 [facility] MD put her on reglan 5mg  three times a day, GI was not consulted. March, 2010 combination of daily scheduled Zofran and t.i.d. Reglan 5 mg a day has been helpful. She was cut back to Reglan as needed to due concern for potential side effects. June 2011: Was recommended to take 1-2 Reglan pills at bedtime only. Also started Megace for appetite stimulant. January 2013: She has gained 20 pounds and her nausea is under much better control with nightly Reglan.   She was in ER last week with 24 hour vomiting, then called here the next day for pain meds since her throat hurt so badly.  Limited narcotic script called in.   HPI: This is a   very pleasant 47 year old woman whom I last saw about one year ago.  Has been having a lot of vomiting, nausea.  A year ago was doing well on nightly reglan.  She has stayed on this nightly for the past year.    Rarely needs pain meds, maybe once per month does she need narcotic pain medicine.  zofran can help.  Feels nauseas just about daily now.     Past Medical History  Diagnosis Date  . Gastroparesis   . Gout   . Hypotension   . Herpes genitalia 1994  . E coli bacteremia 06/18/2011  . Bacteremia due to Gram-negative bacteria 05/23/2011  . ESRD (end stage renal disease) 06/12/2011  . Complication of anesthesia     "woke up during OR; I have an extremely high tolerance" (12/11/2011)  . Dysrhythmia     "tachycardia" (12/11/2011)  . Iron deficiency anemia   . History of  blood transfusion     "more than a few times" (12/11/2011)  . History of stomach ulcers   . GERD (gastroesophageal reflux disease)   . Headache     "not often anymore" (12/11/2011)  . Seizures 1994    "post transplant; only have had that one" (12/11/2011)  . Stroke      left basal ganglia lacunar infarct; Right frontal lobe lacunar infarct.  . Stroke ~ 1999; 2001    "briefly lost my vision; lost my right eye" (12/11/2011)  . Chronic lower back pain   . Depression   . Osteopenia     Past Surgical History  Procedure Laterality Date  . Kidney transplant  1994; 1999; 2005    "right"  . Appendectomy  ~ 2004  . Enucleation  2001    "right"  . 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 Total nephrectomy  1988?; 1994; 2005  . Multiple tooth extractions      Current Outpatient Prescriptions  Medication Sig Dispense Refill  . ALPRAZolam (XANAX) 0.25 MG tablet Take 0.25 mg by mouth 2 (two) times daily as needed. For anxiety      . aspirin EC 81 MG tablet Take 81 mg by mouth daily.      [name] azaTHIOprine (IMURAN) 50 MG tablet Take 100 mg by mouth daily.      . calcitRIOL (ROCALTROL) 0.25  MCG capsule Take 0.25 mcg by mouth daily.      . colchicine 0.6 MG tablet Take 0.6 mg by mouth daily as needed (For gout.).      [name] epoetin alfa (EPOGEN,PROCRIT) 60454 UNIT/ML injection Inject 40,000 Units into the skin every 30 (thirty) days. Takes injection the 1st Friday of every month.      . furosemide (LASIX) 40 MG tablet Take 40 mg by mouth 2 (two) times daily as needed. Takes 40 mg daily and an additional 40 mg later in day if needed for swelling      . Hydrocodone-Acetaminophen 7.5-300 MG TABS Take 1-2 tablets by mouth every 6 (six) hours as needed (as needed for Pain).  20 each  0  . midodrine (PROAMATINE) 10 MG tablet Take 10 mg by mouth 2 (two) times daily.      . Multiple Vitamin (MULITIVITAMIN WITH MINERALS) TABS Take 1 tablet by mouth  daily.      [name] omeprazole (PRILOSEC) 40 MG capsule Take 40 mg by mouth 2 (two) times daily.       . Oxycodone HCl 10 MG TABS Take 10 mg by mouth 4 (four) times daily as needed. For pain      . oxyCODONE-acetaminophen (PERCOCET) 10-650 MG per tablet Take 1 tablet by mouth every 6 (six) hours as needed. For pain.      . predniSONE (DELTASONE) 5 MG tablet Take 5 mg by mouth daily.      . promethazine (PHENERGAN) 25 MG tablet Take 25 mg by mouth every 6 (six) hours as needed. For nausea       . tacrolimus (PROGRAF) 1 MG capsule Take 2-3 mg by mouth 2 (two) times daily. Takes 3 mg in the morning and 2 mg at night       No current facility-administered medications for this visit.    Allergies as of 03/29/2012 - Review Complete 03/29/2012  Allergen Reaction Noted  . Levofloxacin Rash 05/23/2011    Family History  Problem Relation Age of Onset  . Hypertension Maternal Grandmother     History   Social History  . Marital Status: Single    Spouse Name: N/A    Number of Children: 0  . Years of Education: N/A   Occupational History  . disabiled    Social History Main Topics  . Smoking status: Former Smoker -- 0.50 packs/day for 28 years    Types: Cigarettes    Quit date: 03/05/2011  . Smokeless tobacco: Never Used  . Alcohol Use: Yes     Comment: 12/11/2011 "rarely have a mixed drink"  . Drug Use: Yes    Special: Marijuana     Comment: 12/11/2011 "last marijuana was 2 days ago; helps my appetite"  . Sexually Active: Yes   Other Topics Concern  . Not on file   Social History Narrative  . No narrative on file      Physical Exam: BP 108/62  Pulse 60  Ht 4\' 11"  (1.499 m)  Wt 126 lb (57.153 kg)  BMI 25.44 kg/m2 Constitutional: generally well-appearing Psychiatric: alert and oriented x3 Abdomen: soft, nontender, nondistended, no obvious ascites, no peritoneal signs, normal bowel sounds     Assessment and plan: 47 y.o. female with multifactorial nausea  She has  documented mild gastroparesis. She is on immunomodulators which have a side effect of nausea. Previously her nausea and vomiting were under good control and once nightly Reglan. For the past 2 weeks since she was hospitalized she has  had been taking Reglan about 4 times a day and has not noticed an improvement. She has several bottles of Zofran still and I recommended she take 1 Zofran shortly after she wakes up every morning and continue the nightly Reglan. Her last upper endoscopy was 4-5 years ago I would like to repeat that now to see if anything significant change such as H. pylori gastritis or ulcer disease.

## 2012-03-30 ENCOUNTER — Telehealth: Payer: Self-pay | Admitting: Gastroenterology

## 2012-03-31 NOTE — Telephone Encounter (Signed)
Left message on machine to call back  

## 2012-04-06 NOTE — Telephone Encounter (Signed)
Unable to reach pt I will await further communication 

## 2012-04-07 ENCOUNTER — Other Ambulatory Visit (HOSPITAL_COMMUNITY): Payer: Self-pay | Admitting: *Deleted

## 2012-04-08 ENCOUNTER — Inpatient Hospital Stay (HOSPITAL_COMMUNITY): Admission: RE | Admit: 2012-04-08 | Payer: Medicaid Other | Source: Ambulatory Visit

## 2012-04-09 ENCOUNTER — Encounter (HOSPITAL_COMMUNITY): Payer: Self-pay

## 2012-04-09 ENCOUNTER — Emergency Department (HOSPITAL_COMMUNITY): Payer: Medicaid Other

## 2012-04-09 ENCOUNTER — Inpatient Hospital Stay (HOSPITAL_COMMUNITY)
Admission: EM | Admit: 2012-04-09 | Discharge: 2012-04-11 | DRG: 689 | Disposition: A | Discharge status: Home / Self Care | Payer: Medicaid Other | Attending: Internal Medicine | Admitting: Internal Medicine

## 2012-04-09 DIAGNOSIS — E860 Dehydration: Secondary | ICD-10-CM | POA: Diagnosis present

## 2012-04-09 DIAGNOSIS — F329 Major depressive disorder, single episode, unspecified: Secondary | ICD-10-CM | POA: Diagnosis present

## 2012-04-09 DIAGNOSIS — Z8711 Personal history of peptic ulcer disease: Secondary | ICD-10-CM

## 2012-04-09 DIAGNOSIS — N390 Urinary tract infection, site not specified: Principal | ICD-10-CM | POA: Diagnosis present

## 2012-04-09 DIAGNOSIS — F411 Generalized anxiety disorder: Secondary | ICD-10-CM

## 2012-04-09 DIAGNOSIS — Z8673 Personal history of transient ischemic attack (TIA), and cerebral infarction without residual deficits: Secondary | ICD-10-CM

## 2012-04-09 DIAGNOSIS — F419 Anxiety disorder, unspecified: Secondary | ICD-10-CM | POA: Diagnosis present

## 2012-04-09 DIAGNOSIS — G8929 Other chronic pain: Secondary | ICD-10-CM | POA: Diagnosis present

## 2012-04-09 DIAGNOSIS — R112 Nausea with vomiting, unspecified: Secondary | ICD-10-CM | POA: Diagnosis present

## 2012-04-09 DIAGNOSIS — A419 Sepsis, unspecified organism: Secondary | ICD-10-CM

## 2012-04-09 DIAGNOSIS — Z7982 Long term (current) use of aspirin: Secondary | ICD-10-CM

## 2012-04-09 DIAGNOSIS — M899 Disorder of bone, unspecified: Secondary | ICD-10-CM | POA: Diagnosis present

## 2012-04-09 DIAGNOSIS — B9620 Unspecified Escherichia coli [E. coli] as the cause of diseases classified elsewhere: Secondary | ICD-10-CM

## 2012-04-09 DIAGNOSIS — N186 End stage renal disease: Secondary | ICD-10-CM | POA: Diagnosis present

## 2012-04-09 DIAGNOSIS — Z881 Allergy status to other antibiotic agents status: Secondary | ICD-10-CM

## 2012-04-09 DIAGNOSIS — M109 Gout, unspecified: Secondary | ICD-10-CM

## 2012-04-09 DIAGNOSIS — R634 Abnormal weight loss: Secondary | ICD-10-CM

## 2012-04-09 DIAGNOSIS — N179 Acute kidney failure, unspecified: Secondary | ICD-10-CM | POA: Diagnosis present

## 2012-04-09 DIAGNOSIS — N189 Chronic kidney disease, unspecified: Secondary | ICD-10-CM | POA: Diagnosis present

## 2012-04-09 DIAGNOSIS — IMO0002 Reserved for concepts with insufficient information to code with codable children: Secondary | ICD-10-CM

## 2012-04-09 DIAGNOSIS — Z79899 Other long term (current) drug therapy: Secondary | ICD-10-CM

## 2012-04-09 DIAGNOSIS — R7881 Bacteremia: Secondary | ICD-10-CM

## 2012-04-09 DIAGNOSIS — M545 Low back pain, unspecified: Secondary | ICD-10-CM | POA: Diagnosis present

## 2012-04-09 DIAGNOSIS — N10 Acute tubulo-interstitial nephritis: Secondary | ICD-10-CM

## 2012-04-09 DIAGNOSIS — Z87891 Personal history of nicotine dependence: Secondary | ICD-10-CM

## 2012-04-09 DIAGNOSIS — Z87440 Personal history of urinary (tract) infections: Secondary | ICD-10-CM

## 2012-04-09 DIAGNOSIS — K3184 Gastroparesis: Secondary | ICD-10-CM | POA: Diagnosis present

## 2012-04-09 DIAGNOSIS — F3289 Other specified depressive episodes: Secondary | ICD-10-CM | POA: Diagnosis present

## 2012-04-09 DIAGNOSIS — K219 Gastro-esophageal reflux disease without esophagitis: Secondary | ICD-10-CM | POA: Diagnosis present

## 2012-04-09 DIAGNOSIS — G894 Chronic pain syndrome: Secondary | ICD-10-CM | POA: Diagnosis present

## 2012-04-09 DIAGNOSIS — R031 Nonspecific low blood-pressure reading: Secondary | ICD-10-CM | POA: Diagnosis present

## 2012-04-09 DIAGNOSIS — Z9089 Acquired absence of other organs: Secondary | ICD-10-CM

## 2012-04-09 DIAGNOSIS — D509 Iron deficiency anemia, unspecified: Secondary | ICD-10-CM | POA: Diagnosis present

## 2012-04-09 DIAGNOSIS — B009 Herpesviral infection, unspecified: Secondary | ICD-10-CM

## 2012-04-09 DIAGNOSIS — Z940 Kidney transplant status: Secondary | ICD-10-CM

## 2012-04-09 LAB — URINALYSIS, ROUTINE W REFLEX MICROSCOPIC
Bilirubin Urine: NEGATIVE
Glucose, UA: NEGATIVE mg/dL
Ketones, ur: 15 mg/dL — AB
Nitrite: POSITIVE — AB
Protein, ur: 100 mg/dL — AB
Specific Gravity, Urine: 1.015 (ref 1.005–1.030)
Urobilinogen, UA: 0.2 mg/dL (ref 0.0–1.0)
pH: 6.5 (ref 5.0–8.0)

## 2012-04-09 LAB — PREGNANCY, URINE: Preg Test, Ur: NEGATIVE

## 2012-04-09 LAB — CBC WITH DIFFERENTIAL/PLATELET
Basophils Absolute: 0 10*3/uL (ref 0.0–0.1)
Basophils Relative: 0 % (ref 0–1)
Eosinophils Absolute: 0 10*3/uL (ref 0.0–0.7)
Eosinophils Relative: 0 % (ref 0–5)
HCT: 30.5 % — ABNORMAL LOW (ref 36.0–46.0)
Hemoglobin: 10.8 g/dL — ABNORMAL LOW (ref 12.0–15.0)
Lymphocytes Relative: 10 % — ABNORMAL LOW (ref 12–46)
Lymphs Abs: 0.5 10*3/uL — ABNORMAL LOW (ref 0.7–4.0)
MCH: 30.9 pg (ref 26.0–34.0)
MCHC: 35.4 g/dL (ref 30.0–36.0)
MCV: 87.4 fL (ref 78.0–100.0)
Monocytes Absolute: 0.2 10*3/uL (ref 0.1–1.0)
Monocytes Relative: 5 % (ref 3–12)
Neutro Abs: 4 10*3/uL (ref 1.7–7.7)
Neutrophils Relative %: 85 % — ABNORMAL HIGH (ref 43–77)
Platelets: 187 10*3/uL (ref 150–400)
RBC: 3.49 MIL/uL — ABNORMAL LOW (ref 3.87–5.11)
RDW: 17.3 % — ABNORMAL HIGH (ref 11.5–15.5)
WBC: 4.7 10*3/uL (ref 4.0–10.5)

## 2012-04-09 LAB — COMPREHENSIVE METABOLIC PANEL
ALT: 6 U/L (ref 0–35)
AST: 14 U/L (ref 0–37)
Albumin: 4.4 g/dL (ref 3.5–5.2)
Alkaline Phosphatase: 67 U/L (ref 39–117)
BUN: 28 mg/dL — ABNORMAL HIGH (ref 6–23)
CO2: 23 mEq/L (ref 19–32)
Calcium: 11 mg/dL — ABNORMAL HIGH (ref 8.4–10.5)
Chloride: 100 mEq/L (ref 96–112)
Creatinine, Ser: 2.37 mg/dL — ABNORMAL HIGH (ref 0.50–1.10)
GFR calc Af Amer: 28 mL/min — ABNORMAL LOW (ref >90)
GFR calc non Af Amer: 24 mL/min — ABNORMAL LOW (ref >90)
Glucose, Bld: 94 mg/dL (ref 70–99)
Potassium: 3.3 mEq/L — ABNORMAL LOW (ref 3.5–5.1)
Sodium: 139 mEq/L (ref 135–145)
Total Bilirubin: 0.5 mg/dL (ref 0.3–1.2)
Total Protein: 7.2 g/dL (ref 6.0–8.3)

## 2012-04-09 LAB — URINE MICROSCOPIC-ADD ON

## 2012-04-09 LAB — LIPASE, BLOOD: Lipase: 32 U/L (ref 11–59)

## 2012-04-09 LAB — TROPONIN I: Troponin I: <0.3 ng/mL (ref <0.30)

## 2012-04-09 MED ORDER — METOCLOPRAMIDE HCL 5 MG/ML IJ SOLN
10.0000 mg | Freq: Once | INTRAMUSCULAR | Status: AC
  Administered 2012-04-09: 10 mg via INTRAVENOUS
  Filled 2012-04-09: qty 2

## 2012-04-09 MED ORDER — HYDROMORPHONE HCL PF 1 MG/ML IJ SOLN
1.0000 mg | Freq: Once | INTRAMUSCULAR | Status: AC
  Administered 2012-04-09: 1 mg via INTRAVENOUS
  Filled 2012-04-09: qty 1

## 2012-04-09 MED ORDER — DEXTROSE 5 % IV SOLN
1.0000 g | Freq: Once | INTRAVENOUS | Status: AC
  Administered 2012-04-09: 1 g via INTRAVENOUS
  Filled 2012-04-09: qty 10

## 2012-04-09 MED ORDER — HYDROMORPHONE HCL PF 1 MG/ML IJ SOLN
2.0000 mg | Freq: Once | INTRAMUSCULAR | Status: AC
  Administered 2012-04-09: 2 mg via INTRAVENOUS
  Filled 2012-04-09: qty 2

## 2012-04-09 MED ORDER — SODIUM CHLORIDE 0.9 % IV BOLUS (SEPSIS)
1000.0000 mL | INTRAVENOUS | Status: AC
  Administered 2012-04-09: 1000 mL via INTRAVENOUS

## 2012-04-09 NOTE — ED Notes (Signed)
Pt on the phone talking with "emergency contact"  IV team at bedside

## 2012-04-09 NOTE — ED Provider Notes (Signed)
History     CSN: [id number]  Arrival date & time 04/09/12  1452   First MD Initiated Contact with Patient 04/09/12 1542      Chief Complaint  Patient presents with  . Chest Pain  . Shortness of Breath  . Emesis  . Abdominal Pain    (Consider location/radiation/quality/duration/timing/severity/associated sxs/prior treatment) Patient is a 47 y.o. female presenting with abdominal pain. The history is provided by the patient and a relative. No language interpreter was used.  Abdominal Pain Pain location:  Epigastric Pain quality: aching and sharp   Pain radiates to:  Does not radiate Pain severity:  Unable to specify Onset quality:  Sudden Duration:  36 hours Timing:  Constant Progression:  Worsening Chronicity:  Recurrent Context: not medication withdrawal, not recent illness, not recent travel, not retching, not sick contacts and not suspicious food intake   Relieved by:  Nothing Worsened by:  Eating Associated symptoms: anorexia, chest pain, nausea and vomiting   Associated symptoms: no chills, no cough, no diarrhea, no dysuria, no fatigue, no fever, no melena, no shortness of breath and no sore throat   Chest pain:    Quality:  Aching   Severity:  Moderate   Onset quality:  Unable to specify   Duration:  36 hours   Timing:  Constant   Progression:  Worsening   Chronicity:  Recurrent Nausea:    Severity:  Severe   Onset quality:  Unable to specify   Duration:  36 hours   Timing:  Constant   Progression:  Worsening Vomiting:    Quality:  Stomach contents   Number of occurrences:  >20   Severity:  Severe   Duration:  36 hours   Timing:  Intermittent   Progression:  Unchanged Risk factors: multiple surgeries and recent hospitalization   Risk factors: no alcohol abuse and no aspirin use   Risk factors comment:  For n/v, ab pain   Past Medical History  Diagnosis Date  . Gastroparesis   . Gout   . Hypotension   . Herpes genitalia 1994  . E coli bacteremia  06/18/2011  . Bacteremia due to Gram-negative bacteria 05/23/2011  . ESRD (end stage renal disease) 06/12/2011  . Complication of anesthesia     "woke up during OR; I have an extremely high tolerance" (12/11/2011)  . Dysrhythmia     "tachycardia" (12/11/2011)  . Iron deficiency anemia   . History of blood transfusion     "more than a few times" (12/11/2011)  . History of stomach ulcers   . GERD (gastroesophageal reflux disease)   . Headache     "not often anymore" (12/11/2011)  . Seizures 1994    "post transplant; only have had that one" (12/11/2011)  . Stroke      left basal ganglia lacunar infarct; Right frontal lobe lacunar infarct.  . Stroke ~ 1999; 2001    "briefly lost my vision; lost my right eye" (12/11/2011)  . Chronic lower back pain   . Depression   . Osteopenia     Past Surgical History  Procedure Laterality Date  . Kidney transplant  1994; 1999; 2005    "right"  . Appendectomy  ~ 2004  . Enucleation  2001    "right"  . 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 Total nephrectomy  1988?; 1994; 2005  . Multiple tooth extractions  Family History  Problem Relation Age of Onset  . Hypertension Maternal Grandmother     History  Substance Use Topics  . Smoking status: Former Smoker -- 0.50 packs/day for 28 years    Types: Cigarettes    Quit date: 03/05/2011  . Smokeless tobacco: Never Used  . Alcohol Use: Yes     Comment: 12/11/2011 "rarely have a mixed drink"    OB History   Grav Para Term Preterm Abortions TAB SAB Ect Mult Living                  Review of Systems  Constitutional: Negative for fever, chills, diaphoresis, activity change, appetite change and fatigue.  HENT: Negative for congestion, sore throat, facial swelling, rhinorrhea, neck pain and neck stiffness.   Eyes: Negative for photophobia and discharge.  Respiratory: Negative for cough, chest tightness and shortness of breath.     Cardiovascular: Positive for chest pain. Negative for palpitations and leg swelling.  Gastrointestinal: Positive for nausea, vomiting, abdominal pain and anorexia. Negative for diarrhea and melena.  Endocrine: Negative for polydipsia and polyuria.  Genitourinary: Positive for flank pain (right). Negative for dysuria, frequency, difficulty urinating and pelvic pain.  Musculoskeletal: Negative for back pain and arthralgias.  Skin: Negative for color change and wound.  Allergic/Immunologic: Negative for immunocompromised state.  Neurological: Negative for facial asymmetry, weakness, numbness and headaches.  Hematological: Does not bruise/bleed easily.  Psychiatric/Behavioral: Negative for confusion and agitation.    Allergies  Levofloxacin  Home Medications   No current outpatient prescriptions on file.  BP 95/60  Pulse 69  Temp(Src) 98.5 F (36.9 C) (Oral)  Resp 18  Wt 117 lb 8.1 oz (53.3 kg)  BMI 23.72 kg/m2  SpO2 97%  Physical Exam  Constitutional: She is oriented to person, place, and time. She appears well-developed and well-nourished. No distress.  Thin, chronically ill appearing   HENT:  Head: Normocephalic and atraumatic.  Mouth/Throat: No oropharyngeal exudate.  Mucus membranes dry  Eyes: Pupils are equal, round, and reactive to light.  Neck: Normal range of motion. Neck supple.  Cardiovascular: Normal rate, regular rhythm and normal heart sounds.  Exam reveals no gallop and no friction rub.   No murmur heard. Pulmonary/Chest: Effort normal and breath sounds normal. No respiratory distress. She has no wheezes. She has no rales.  Abdominal: Soft. She exhibits no distension and no mass. Bowel sounds are decreased. There is tenderness in the right upper quadrant, epigastric area and left upper quadrant. There is CVA tenderness (mild, right). There is no rebound and no guarding.    Musculoskeletal: Normal range of motion. She exhibits no edema and no tenderness.   Neurological: She is alert and oriented to person, place, and time.  Skin: Skin is warm and dry.  Psychiatric: She has a normal mood and affect.    ED Course  Procedures (including critical care time)  Labs Reviewed  CBC WITH DIFFERENTIAL - Abnormal; Notable for the following:    RBC 3.49 (*)    Hemoglobin 10.8 (*)    HCT 30.5 (*)    RDW 17.3 (*)    Neutrophils Relative 85 (*)    Lymphocytes Relative 10 (*)    Lymphs Abs 0.5 (*)    All other components within normal limits  COMPREHENSIVE METABOLIC PANEL - Abnormal; Notable for the following:    Potassium 3.3 (*)    BUN 28 (*)    Creatinine, Ser 2.37 (*)    Calcium 11.0 (*)  GFR calc non Af Amer 24 (*)    GFR calc Af Amer 28 (*)    All other components within normal limits  URINALYSIS, ROUTINE W REFLEX MICROSCOPIC - Abnormal; Notable for the following:    Hgb urine dipstick TRACE (*)    Ketones, ur 15 (*)    Protein, ur 100 (*)    Nitrite POSITIVE (*)    Leukocytes, UA TRACE (*)    All other components within normal limits  URINE MICROSCOPIC-ADD ON - Abnormal; Notable for the following:    Squamous Epithelial / LPF FEW (*)    Bacteria, UA FEW (*)    Casts HYALINE CASTS (*)    All other components within normal limits  URINE CULTURE  LIPASE, BLOOD  PREGNANCY, URINE  TROPONIN I  TACROLIMUS LEVEL  BASIC METABOLIC PANEL  CBC   Dg Chest 2 View  04/09/2012  *RADIOLOGY REPORT*  Clinical Data: Chest pain  CHEST - 2 VIEW  Comparison: 03/21/2012  Findings: Lungs are clear. No pleural effusion or pneumothorax.  The heart is normal in size.  SVC stent.  Visualized osseous structures are within normal limits.  Surgical clips in the upper abdomen.  IMPRESSION: No evidence of acute cardiopulmonary disease.   Original Report Authenticated By: Julian Hy, M.D.      1. Pyelonephritis, acute   2. Gastroparesis   3. Acute on chronic kidney failure   4. Anxiety   5. Chronic pain disorder   6. Dehydration, mild   7. ESRD  (end stage renal disease)   8. Nausea with vomiting      Date: 04/09/2012  Rate: 81  Rhythm: normal sinus rhythm  QRS Axis: normal  Intervals: normal  ST/T Wave abnormalities: nonspecific T wave changes, TWI aVL, q waves V5, V6  Conduction Disutrbances:none  Narrative Interpretation:   Old EKG Reviewed: unchanged     MDM  Pt is a 47 y.o. female with pertinent PMHX of gastroparesis, ESRD s/p renal transplant in '05, CVA, Sz, who presents with epigastric and chest pain, n/v similar to prior episodes of gastroparesis flares for about 32 hrs.  Emesis reported as gtr than 20x, non-bloody, nonbilious.  She also developed R flank pain about 8 hrs ago, which she sometimes has w/ gastroparesis flares.  Denies cough, SOB, fever, d/a, constipation, dysuria, decreased UOP.  On PE, pt rolling around in bed.  Decreased BS heard, abdomen soft, palpable RLQ transplant kidney.  Mild R CVA ttp.  Ddx includes gastroparesis, gastroenteritis, GERD, PUD, pancreatis, UTI/pyelo .  As episode reported as similar to prior, will hold on imaging at this point & treat symptomatically.  5:50PM Pt feeling much improved, smiling, up walking to bathroom.  Urine infected.  IV rocephin ordered.  Given pt's immunosuppression, I feel admission neccessary.  [facility] requested troponin to be added, which was negative.     1. Pyelonephritis, acute   2. Gastroparesis   3. Acute on chronic kidney failure   4. Anxiety   5. Chronic pain disorder   6. Dehydration, mild   7. ESRD (end stage renal disease)   8. Nausea with vomiting      Labs and imaging considered in decision making, reviewed by myself.  Imaging interpreted by radiology. Pt care discussed with my attending, Dr. Wyvonnia Dusky.         Ernestina Patches, MD 04/10/12 [phone]

## 2012-04-09 NOTE — ED Notes (Signed)
Pt given ginger ale per request of pt. Pt is cooperative.

## 2012-04-09 NOTE — ED Notes (Addendum)
Pt states she started having abd and cp last night.  Pt hyperventilating in triage.  Pt O2 sats 100%.  Pt is grabbing abdomen around umbilicus and stating "it needs to come out.  Please put something in my butt to make it come out".  When this RN asked what needs to come out pt stopped talking and closed her eyes.  Pt also c/o vomiting.  Pt is coughing and heaving, but no emesis.

## 2012-04-09 NOTE — ED Notes (Signed)
Patient transported to X-ray 

## 2012-04-09 NOTE — H&P (Signed)
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  Frequent episodes of nonbloody n/v over the last 24 hours with epigastric abd pain, sscp, and lower back pain (chronic but is worse than normal) not able to keep any of her pain meds down.  No fever.  No dysuria or hematuria.  No sob.  No swelling.  No le edema.  This feels similar to prior episodes of gastroparesis flares.    Review of Systems:  Positive and negative as per HPI otherwise all other systems are negative  Past Medical History: Past Medical History  Diagnosis Date  . Gastroparesis   . Gout   . Hypotension   . Herpes genitalia 1994  . E coli bacteremia 06/18/2011  . Bacteremia due to Gram-negative bacteria 05/23/2011  . ESRD (end stage renal disease) 06/12/2011  . Complication of anesthesia     "woke up during OR; I have an extremely high tolerance" (12/11/2011)  . Dysrhythmia     "tachycardia" (12/11/2011)  . Iron deficiency anemia   . History of blood transfusion     "more than a few times" (12/11/2011)  . History of stomach ulcers   . GERD (gastroesophageal reflux disease)   . Headache     "not often anymore" (12/11/2011)  . Seizures 1994    "post transplant; only have had that one" (12/11/2011)  . Stroke      left basal ganglia lacunar infarct; Right frontal lobe lacunar infarct.  . Stroke ~ 1999; 2001    "briefly lost my vision; lost my right eye" (12/11/2011)  . Chronic lower back pain   . Depression   . Osteopenia    Past Surgical History  Procedure Laterality Date  . Kidney transplant  1994; 1999; 2005    "right"  . Appendectomy  ~ 2004  . Enucleation  2001    "right"  . 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 Total  nephrectomy  1988?; 1994; 2005  . Multiple tooth extractions      Medications: Prior to Admission medications   Medication Sig Start Date End Date Taking? Authorizing Provider  ALPRAZolam (XANAX) 0.25 MG tablet Take 0.25 mg by mouth 2 (two) times daily as needed. For anxiety   Yes Historical Provider, MD  aspirin EC 81 MG tablet Take 81 mg by mouth daily.   Yes Historical Provider, MD  azaTHIOprine (IMURAN) 50 MG tablet Take 100 mg by mouth daily.   Yes Historical Provider, MD  calcitRIOL (ROCALTROL) 0.25 MCG capsule Take 0.25 mcg by mouth daily.   Yes Historical Provider, MD  colchicine 0.6 MG tablet Take 0.6 mg by mouth daily as needed (For gout.).   Yes Historical Provider, MD  epoetin alfa (EPOGEN,PROCRIT) 96295 UNIT/ML injection Inject 40,000 Units into the skin every 30 (thirty) days. Takes injection the 1st Friday of every month.   Yes Historical Provider, MD  furosemide (LASIX) 40 MG tablet Take 40 mg by mouth 2 (two) times daily as needed. Takes 40 mg daily and an additional 40 mg later in day if needed for swelling 06/22/11  Yes [name], MD  Hydrocodone-Acetaminophen 7.5-300 MG TABS Take 1-2 tablets by mouth every 6 (six) hours as needed (as needed  for Pain). 03/22/12  Yes Milus Banister, MD  midodrine (PROAMATINE) 10 MG tablet Take 10 mg by mouth 2 (two) times daily.   Yes Historical Provider, MD  Multiple Vitamin (MULITIVITAMIN WITH MINERALS) TABS Take 1 tablet by mouth daily.   Yes Historical Provider, MD  omeprazole (PRILOSEC) 40 MG capsule Take 40 mg by mouth 2 (two) times daily.    Yes Historical Provider, MD  ondansetron (ZOFRAN) 4 MG tablet Take 4 mg by mouth every 8 (eight) hours as needed for nausea.   Yes Historical Provider, MD  Oxycodone HCl 10 MG TABS Take 10 mg by mouth 4 (four) times daily as needed. For pain   Yes Historical Provider, MD  oxyCODONE-acetaminophen (PERCOCET) 10-650 MG per tablet Take 1 tablet by mouth every 6 (six) hours as needed. For pain.    Yes Historical Provider, MD  predniSONE (DELTASONE) 5 MG tablet Take 5 mg by mouth daily. 06/22/11  Yes [name], MD  promethazine (PHENERGAN) 25 MG tablet Take 25 mg by mouth every 6 (six) hours as needed. For nausea    Yes Historical Provider, MD  tacrolimus (PROGRAF) 1 MG capsule Take 2-3 mg by mouth 2 (two) times daily. Takes 3 mg in the morning and 2 mg at night 06/22/11  Yes [name], MD    Allergies:   Allergies  Allergen Reactions  . Levofloxacin Rash    Social History:  reports that she quit smoking about 13 months ago. Her smoking use included Cigarettes. She has a 14 pack-year smoking history. She has never used smokeless tobacco. She reports that  drinks alcohol. She reports that she uses illicit drugs (Marijuana).  Family History: Family History  Problem Relation Age of Onset  . Hypertension Maternal Grandmother     Physical Exam: Filed Vitals:   04/09/12 1518 04/09/12 1919 04/09/12 1957  BP: 122/80 95/49 89/50   Pulse: 88 58 59  Temp:  99.3 F (37.4 C)   TempSrc:  Oral   Resp: 23    SpO2: 100% 94% 100%   General appearance: alert, cooperative and no distress Head: Normocephalic, without obvious abnormality, atraumatic Eyes: negative Neck: no JVD and supple, symmetrical, trachea midline Back: negative, symmetric, no curvature. ROM normal. No CVA tenderness. Lungs: clear to auscultation bilaterally Heart: regular rate and rhythm, S1, S2 normal, no murmur, click, rub or gallop Abdomen: soft, non-tender; bowel sounds normal; no masses,  no organomegaly Extremities: extremities normal, atraumatic, no cyanosis or edema Pulses: 2+ and symmetric Skin: Skin color, texture, turgor normal. No rashes or lesions Neurologic: Grossly normal    Labs on Admission:   Recent Labs  04/09/12 1632  NA 139  K 3.3*  CL 100  CO2 23  GLUCOSE 94  BUN 28*  CREATININE 2.37*  CALCIUM 11.0*    Recent Labs  04/09/12 1632  AST 14  ALT 6  ALKPHOS 67   BILITOT 0.5  PROT 7.2  ALBUMIN 4.4    Recent Labs  04/09/12 1632  LIPASE 32    Recent Labs  04/09/12 1632  WBC 4.7  NEUTROABS 4.0  HGB 10.8*  HCT 30.5*  MCV 87.4  PLT 187    Radiological Exams on Admission: Dg Chest 2 View  04/09/2012  *RADIOLOGY REPORT*  Clinical Data: Chest pain  CHEST - 2 VIEW  Comparison: 03/21/2012  Findings: Lungs are clear. No pleural effusion or pneumothorax.  The heart is normal in size.  SVC stent.  Visualized osseous structures are within normal limits.  Surgical clips in the upper abdomen.  IMPRESSION: No evidence of acute cardiopulmonary disease.   Original Report Authenticated By: Julian Hy, M.D.     Assessment/Plan  47 yo female with n/v, gastroparesis, epi abd pain/lower chest pain, acute on chronic back pain Principal Problem:   NAUSEA AND VOMITING Active Problems:   Gastroparesis   History of renal transplantation   Acute on chronic kidney failure   ESRD (end stage renal disease)   UTI (lower urinary tract infection)   Anxiety   Chronic pain disorder   Dehydration, mild  Has uti +/- pyelo.  Place on rocephin.  Gentle ivf overnight, mildly dehydrated.  zofran prn and iv pain meds.  Obs.  Full code.  Roosevelt Bisher A 04/09/2012, 8:24 PM

## 2012-04-10 ENCOUNTER — Encounter (HOSPITAL_COMMUNITY): Payer: Self-pay | Admitting: *Deleted

## 2012-04-10 DIAGNOSIS — G894 Chronic pain syndrome: Secondary | ICD-10-CM

## 2012-04-10 DIAGNOSIS — N179 Acute kidney failure, unspecified: Secondary | ICD-10-CM

## 2012-04-10 LAB — CBC
HCT: 27.1 % — ABNORMAL LOW (ref 36.0–46.0)
Hemoglobin: 9.6 g/dL — ABNORMAL LOW (ref 12.0–15.0)
MCH: 30.9 pg (ref 26.0–34.0)
MCHC: 35.4 g/dL (ref 30.0–36.0)
MCV: 87.1 fL (ref 78.0–100.0)
Platelets: 186 10*3/uL (ref 150–400)
RBC: 3.11 MIL/uL — ABNORMAL LOW (ref 3.87–5.11)
RDW: 17.6 % — ABNORMAL HIGH (ref 11.5–15.5)
WBC: 5.3 10*3/uL (ref 4.0–10.5)

## 2012-04-10 LAB — BASIC METABOLIC PANEL
BUN: 26 mg/dL — ABNORMAL HIGH (ref 6–23)
CO2: 21 mEq/L (ref 19–32)
Calcium: 9.9 mg/dL (ref 8.4–10.5)
Chloride: 101 mEq/L (ref 96–112)
Creatinine, Ser: 2.35 mg/dL — ABNORMAL HIGH (ref 0.50–1.10)
GFR calc Af Amer: 28 mL/min — ABNORMAL LOW (ref >90)
GFR calc non Af Amer: 24 mL/min — ABNORMAL LOW (ref >90)
Glucose, Bld: 90 mg/dL (ref 70–99)
Potassium: 4.3 mEq/L (ref 3.5–5.1)
Sodium: 136 mEq/L (ref 135–145)

## 2012-04-10 MED ORDER — AZATHIOPRINE 50 MG PO TABS
100.0000 mg | ORAL_TABLET | Freq: Every day | ORAL | Status: DC
  Administered 2012-04-10 – 2012-04-11 (×2): 100 mg via ORAL
  Filled 2012-04-10 (×2): qty 2

## 2012-04-10 MED ORDER — TACROLIMUS 1 MG PO CAPS
3.0000 mg | ORAL_CAPSULE | Freq: Two times a day (BID) | ORAL | Status: DC
  Administered 2012-04-10 – 2012-04-11 (×3): 3 mg via ORAL
  Filled 2012-04-10 (×5): qty 3

## 2012-04-10 MED ORDER — SODIUM CHLORIDE 0.9 % IV SOLN: INTRAVENOUS | Status: AC

## 2012-04-10 MED ORDER — METOCLOPRAMIDE HCL 5 MG/ML IJ SOLN
5.0000 mg | Freq: Three times a day (TID) | INTRAMUSCULAR | Status: DC
  Administered 2012-04-10 – 2012-04-11 (×4): 5 mg via INTRAVENOUS
  Filled 2012-04-10 (×7): qty 1

## 2012-04-10 MED ORDER — TACROLIMUS 1 MG PO CAPS
2.0000 mg | ORAL_CAPSULE | Freq: Every day | ORAL | Status: DC
  Administered 2012-04-10: 2 mg via ORAL
  Filled 2012-04-10: qty 2

## 2012-04-10 MED ORDER — PREDNISONE 5 MG PO TABS
5.0000 mg | ORAL_TABLET | Freq: Every day | ORAL | Status: DC
  Administered 2012-04-10 – 2012-04-11 (×2): 5 mg via ORAL
  Filled 2012-04-10 (×3): qty 1

## 2012-04-10 MED ORDER — DIPHENHYDRAMINE HCL 25 MG PO CAPS
25.0000 mg | ORAL_CAPSULE | Freq: Four times a day (QID) | ORAL | Status: DC | PRN
  Administered 2012-04-10: 25 mg via ORAL
  Filled 2012-04-10 (×2): qty 1

## 2012-04-10 MED ORDER — MIDODRINE HCL 5 MG PO TABS
10.0000 mg | ORAL_TABLET | Freq: Two times a day (BID) | ORAL | Status: DC
  Administered 2012-04-10 – 2012-04-11 (×4): 10 mg via ORAL
  Filled 2012-04-10 (×5): qty 2

## 2012-04-10 MED ORDER — PANTOPRAZOLE SODIUM 40 MG PO TBEC
40.0000 mg | DELAYED_RELEASE_TABLET | Freq: Two times a day (BID) | ORAL | Status: DC
  Administered 2012-04-10 – 2012-04-11 (×3): 40 mg via ORAL
  Filled 2012-04-10 (×2): qty 1

## 2012-04-10 MED ORDER — CALCITRIOL 0.25 MCG PO CAPS
0.2500 ug | ORAL_CAPSULE | Freq: Every day | ORAL | Status: DC
  Administered 2012-04-10 – 2012-04-11 (×2): 0.25 ug via ORAL
  Filled 2012-04-10 (×2): qty 1

## 2012-04-10 MED ORDER — ALPRAZOLAM 0.25 MG PO TABS: 0.2500 mg | ORAL_TABLET | Freq: Two times a day (BID) | ORAL | Status: DC | PRN

## 2012-04-10 MED ORDER — SODIUM CHLORIDE 0.9 % IV SOLN
25.0000 mg | Freq: Four times a day (QID) | INTRAVENOUS | Status: DC | PRN
  Administered 2012-04-10: 25 mg via INTRAVENOUS
  Filled 2012-04-10: qty 0.5

## 2012-04-10 MED ORDER — TACROLIMUS 1 MG PO CAPS: 3.0000 mg | ORAL_CAPSULE | Freq: Every day | ORAL | Status: DC

## 2012-04-10 MED ORDER — TACROLIMUS 1 MG PO CAPS
1.0000 mg | ORAL_CAPSULE | Freq: Once | ORAL | Status: AC
  Administered 2012-04-10: 1 mg via ORAL
  Filled 2012-04-10: qty 1

## 2012-04-10 MED ORDER — ASPIRIN EC 81 MG PO TBEC
81.0000 mg | DELAYED_RELEASE_TABLET | Freq: Every day | ORAL | Status: DC
  Administered 2012-04-10 – 2012-04-11 (×2): 81 mg via ORAL
  Filled 2012-04-10 (×2): qty 1

## 2012-04-10 MED ORDER — HYDROMORPHONE HCL PF 1 MG/ML IJ SOLN
1.0000 mg | INTRAMUSCULAR | Status: DC | PRN
  Administered 2012-04-10 – 2012-04-11 (×11): 1 mg via INTRAVENOUS
  Filled 2012-04-10 (×11): qty 1

## 2012-04-10 MED ORDER — HEPARIN SODIUM (PORCINE) 5000 UNIT/ML IJ SOLN
5000.0000 [IU] | Freq: Three times a day (TID) | INTRAMUSCULAR | Status: DC
  Administered 2012-04-10 – 2012-04-11 (×3): 5000 [IU] via SUBCUTANEOUS
  Filled 2012-04-10 (×6): qty 1

## 2012-04-10 MED ORDER — COLCHICINE 0.6 MG PO TABS
0.6000 mg | ORAL_TABLET | Freq: Every day | ORAL | Status: DC | PRN
  Filled 2012-04-10: qty 1

## 2012-04-10 MED ORDER — DEXTROSE 5 % IV SOLN
1.0000 g | INTRAVENOUS | Status: DC
  Administered 2012-04-10: 1 g via INTRAVENOUS
  Filled 2012-04-10 (×2): qty 10

## 2012-04-10 MED ORDER — ADULT MULTIVITAMIN W/MINERALS CH
1.0000 | ORAL_TABLET | Freq: Every day | ORAL | Status: DC
  Administered 2012-04-10 – 2012-04-11 (×2): 1 via ORAL
  Filled 2012-04-10 (×2): qty 1

## 2012-04-10 MED ORDER — ONDANSETRON HCL 4 MG PO TABS: 4.0000 mg | ORAL_TABLET | Freq: Four times a day (QID) | ORAL | Status: DC | PRN

## 2012-04-10 MED ORDER — SODIUM CHLORIDE 0.9 % IV SOLN
INTRAVENOUS | Status: DC
  Administered 2012-04-10: 02:00:00 via INTRAVENOUS

## 2012-04-10 MED ORDER — ONDANSETRON HCL 4 MG/2ML IJ SOLN
4.0000 mg | Freq: Four times a day (QID) | INTRAMUSCULAR | Status: DC | PRN
  Administered 2012-04-10 – 2012-04-11 (×4): 4 mg via INTRAVENOUS
  Filled 2012-04-10 (×4): qty 2

## 2012-04-10 MED ORDER — ALUM & MAG HYDROXIDE-SIMETH 200-200-20 MG/5ML PO SUSP
30.0000 mL | Freq: Four times a day (QID) | ORAL | Status: DC | PRN
  Filled 2012-04-10: qty 30

## 2012-04-10 NOTE — ED Notes (Signed)
Pt talking on phone.  Pt alert and cooperative

## 2012-04-10 NOTE — Progress Notes (Signed)
Pt transferred from the ED around , admitted to [facility]. Pt comes from home. She is alert and oriented, ambulates independantly. No skin breakdown noted. Oriented to room, call bell within reach.  Pt adamantly refusing to surrender her meds, states she she need to take them and its not her fault that the MD did not order them. Reviewed medications with pt, compared home meds with meds ordered for tonight. Pt also states she needs to eat before she can take her medications and a clear liquid diet will not be sufficient. MD notified, instructed to reinforce with pt that she cannot take her home meds; orders received and pt given rationale for why meds are being held. Pt agrees not to take home meds but still would like to keep them at her bedside. She is currently in agreement with medication regiment and resting at this time. Will continue to monitor

## 2012-04-10 NOTE — ED Provider Notes (Signed)
I saw and evaluated the patient, reviewed the resident's note and I agree with the findings and plan.  Hx renal transplant 2005 with upper abdominal pain, nausea, vomiting, similar to previous gastroparesis flares.  No fever.  TTP epigastrum and RLQ over kidney. +UA with flank pain.  Ezequiel Essex, MD 04/10/12 1315

## 2012-04-10 NOTE — Progress Notes (Signed)
UR Completed Maygen Sirico Graves-Bigelow, RN,BSN [phone]  

## 2012-04-10 NOTE — Progress Notes (Signed)
[facility]                                                                                Patient Demographics  Tina Watkins, is a 47 y.o. female, DOB - 16-Feb-1965, [id number], [place] date - 04/09/2012  Admitting Physician Derrill Kay, MD  Outpatient Primary MD for the patient is [name], MD  LOS - 1   Chief Complaint  Patient presents with  . Chest Pain  . Shortness of Breath  . Emesis  . 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 Frequent episodes of nonbloody n/v over the last 24 hours with epigastric abd pain, sscp, and lower back pain (chronic but is worse than normal) not able to keep any of her pain meds down. No fever. No dysuria or hematuria. No sob. No swelling. No le edema. This feels similar to prior episodes of gastroparesis flares. She now feels better this morning no nausea vomiting, diet will be advanced.    1. Nausea vomiting and mild epigastric pain - in a patient with history of gastroparesis, this episode was similar to her gastroparesis flare, with bowel rest she's feeling better, diet will be advanced along with supportive care, monitor closely. Gentle IV fluids and Reglan to be continued.   2. Renal transplant patient. No acute issues, creatinine at baseline of 2.3-2.5, discussed on the floor with patient's primary nephrologist Dr. Azzie Roup. Home medications for immunosuppression will be continued.   3. UTI with questionable pyelonephritis. No flank tenderness. On Rocephin to be continued, monitor cultures.   4. Baseline low blood pressures. Continue MetroCream, supportive care, patient completely asymptomatic and getting in the hallway without any symptoms. Continue gentle IV fluids.    Code Status:  Full  Family Communication: Patient and female caretaker  Disposition Plan: home   Procedures    Consults  discussed with Dr. Azzie Roup on the floor   DVT Prophylaxis   Heparin   Lab Results  Component Value Date   PLT 186 04/10/2012    Medications  Scheduled Meds: . aspirin EC  81 mg Oral Daily  . azaTHIOprine  100 mg Oral Daily  . calcitRIOL  0.25 mcg Oral Daily  . cefTRIAXone (ROCEPHIN)  IV  1 g Intravenous Q24H  . heparin  5,000 Units Subcutaneous Q8H  . metoCLOPramide (REGLAN) injection  5 mg Intravenous Q8H  . midodrine  10 mg Oral BID WC  . multivitamin with minerals  1 tablet Oral Daily  . pantoprazole  40 mg Oral BID  . predniSONE  5 mg Oral Q breakfast  . tacrolimus  3 mg Oral BID   Continuous Infusions:  PRN Meds:.ALPRAZolam, alum & mag hydroxide-simeth, colchicine, diphenhydrAMINE, HYDROmorphone (DILAUDID) injection, ondansetron (ZOFRAN) IV, ondansetron  Antibiotics     Anti-infectives   Start     Dose/Rate Route Frequency Ordered Stop   04/10/12 2000  cefTRIAXone (ROCEPHIN) 1 g in dextrose 5 % 50 mL IVPB     1 g  100 mL/hr over 30 Minutes Intravenous Every 24 hours 04/10/12 0109     04/09/12 1919  cefTRIAXone (ROCEPHIN) 1 g in dextrose 5 % 50 mL IVPB     1 g 100 mL/hr over 30 Minutes Intravenous  Once 04/09/12 1919 04/09/12 2101       Time Spent in minutes   35   Tina Watkins K M.D on 04/10/2012 at 1:55 PM  Between 7am to 7pm - Pager - [phone]  After 7pm go to [email or web address] - password [email or web address]  And look for the night coverage person covering for me after hours  [facility] Office  [phone]    Subjective:   Tina Watkins today has, No headache, No chest pain, No abdominal pain - No Nausea or vomiting no diarrhea, No new weakness tingling or numbness, No Cough - SOB.    Objective:   Filed Vitals:   04/10/12 0128 04/10/12 0156 04/10/12 0641 04/10/12 0900  BP: 95/60  114/59 97/68  Pulse: 69  58 63  Temp: 98.5 F (36.9  C)  98.6 F (37 C) 98.7 F (37.1 C)  TempSrc: Oral  Oral Oral  Resp: 18  20 18   Height:  4\' 11"  (1.499 m)    Weight: 53.3 kg (117 lb 8.1 oz)     SpO2: 97%  100% 100%    Wt Readings from Last 3 Encounters:  04/10/12 53.3 kg (117 lb 8.1 oz)  03/29/12 57.153 kg (126 lb)  12/11/11 58.65 kg (129 lb 4.8 oz)     Intake/Output Summary (Last 24 hours) at 04/10/12 1355 Last data filed at 04/10/12 1338  Gross per 24 hour  Intake    720 ml  Output      0 ml  Net    720 ml    Exam Awake Alert, Oriented X 3, No new F.N deficits, Normal affect Tina Watkins,PERRAL Supple Neck,No JVD, No cervical lymphadenopathy appriciated.  Symmetrical Chest wall movement, Good air movement bilaterally, CTAB RRR,No Gallops,Rubs or new Murmurs, No Parasternal Heave +ve B.Sounds, Abd Soft, Non tender, No organomegaly appriciated, No rebound - guarding or rigidity. No Cyanosis, Clubbing or edema, No new Rash or bruise    Data Review   Micro Results No results found for this or any previous visit (from the past 240 hour(s)).  Radiology Reports Dg Chest 2 View  04/09/2012  *RADIOLOGY REPORT*  Clinical Data: Chest pain  CHEST - 2 VIEW  Comparison: 03/21/2012  Findings: Lungs are clear. No pleural effusion or pneumothorax.  The heart is normal in size.  SVC stent.  Visualized osseous structures are within normal limits.  Surgical clips in the upper abdomen.  IMPRESSION: No evidence of acute cardiopulmonary disease.   Original Report Authenticated By: Julian Hy, M.D.        [facility] 04/09/12 1632 04/10/12 0605  WBC 4.7 5.3  HGB 10.8* 9.6*  HCT 30.5* 27.1*  PLT 187 186  MCV 87.4 87.1  MCH 30.9 30.9  MCHC 35.4 35.4  RDW 17.3* 17.6*  LYMPHSABS 0.5*  --   MONOABS 0.2  --   EOSABS 0.0  --   BASOSABS 0.0  --     Chemistries   Recent Labs Lab 04/09/12 1632 04/10/12 0605  NA 139 136  K 3.3* 4.3  CL 100 101  CO2 23 21  GLUCOSE 94 90  BUN 28* 26*  CREATININE 2.37* 2.35*   CALCIUM 11.0* 9.9  AST 14  --   ALT 6  --  ALKPHOS 67  --   BILITOT 0.5  --    ------------------------------------------------------------------------------------------------------------------ estimated creatinine clearance is 22.8 ml/min (by C-G formula based on Cr of 2.35). ------------------------------------------------------------------------------------------------------------------ No results found for this basename: HGBA1C,  in the last 72 hours ------------------------------------------------------------------------------------------------------------------ No results found for this basename: CHOL, HDL, LDLCALC, TRIG, CHOLHDL, LDLDIRECT,  in the last 72 hours ------------------------------------------------------------------------------------------------------------------ No results found for this basename: TSH, T4TOTAL, FREET3, T3FREE, THYROIDAB,  in the last 72 hours ------------------------------------------------------------------------------------------------------------------ No results found for this basename: VITAMINB12, FOLATE, FERRITIN, TIBC, IRON, RETICCTPCT,  in the last 72 hours  Coagulation profile No results found for this basename: INR, PROTIME,  in the last 168 hours  No results found for this basename: DDIMER,  in the last 72 hours  Cardiac Enzymes  Recent Labs Lab 04/09/12 2054  TROPONINI <0.30   ------------------------------------------------------------------------------------------------------------------ No components found with this basename: POCBNP,

## 2012-04-11 ENCOUNTER — Inpatient Hospital Stay (HOSPITAL_COMMUNITY): Admission: RE | Admit: 2012-04-11 | Payer: Medicaid Other | Source: Ambulatory Visit

## 2012-04-11 DIAGNOSIS — N390 Urinary tract infection, site not specified: Principal | ICD-10-CM

## 2012-04-11 DIAGNOSIS — K3184 Gastroparesis: Secondary | ICD-10-CM

## 2012-04-11 DIAGNOSIS — Z940 Kidney transplant status: Secondary | ICD-10-CM

## 2012-04-11 LAB — URINE CULTURE: Colony Count: >=100000

## 2012-04-11 LAB — BASIC METABOLIC PANEL
BUN: 25 mg/dL — ABNORMAL HIGH (ref 6–23)
CO2: 23 mEq/L (ref 19–32)
Calcium: 9.2 mg/dL (ref 8.4–10.5)
Chloride: 106 mEq/L (ref 96–112)
Creatinine, Ser: 2.72 mg/dL — ABNORMAL HIGH (ref 0.50–1.10)
GFR calc Af Amer: 23 mL/min — ABNORMAL LOW (ref >90)
GFR calc non Af Amer: 20 mL/min — ABNORMAL LOW (ref >90)
Glucose, Bld: 80 mg/dL (ref 70–99)
Potassium: 3.9 mEq/L (ref 3.5–5.1)
Sodium: 138 mEq/L (ref 135–145)

## 2012-04-11 MED ORDER — DARBEPOETIN ALFA-POLYSORBATE 60 MCG/0.3ML IJ SOLN
60.0000 ug | Freq: Once | INTRAMUSCULAR | Status: AC
  Administered 2012-04-11: 60 ug via SUBCUTANEOUS
  Filled 2012-04-11: qty 0.3

## 2012-04-11 MED ORDER — CIPROFLOXACIN HCL 500 MG PO TABS: 500.0000 mg | ORAL_TABLET | Freq: Two times a day (BID) | ORAL | Status: DC

## 2012-04-11 MED ORDER — CIPROFLOXACIN HCL 500 MG PO TABS
500.0000 mg | ORAL_TABLET | Freq: Two times a day (BID) | ORAL | Status: DC
  Administered 2012-04-11: 500 mg via ORAL
  Filled 2012-04-11 (×3): qty 1

## 2012-04-11 NOTE — Progress Notes (Signed)
Pt discharged to home after visit summary reviewed and pt capable of re verbalizing medications and follow up appointments. Pt remains stable. No signs and symptoms of distress. Educated to return to ER in the event of SOB, dizziness, chest pain, or fainting. Asli Tokarski, RN   

## 2012-04-11 NOTE — Discharge Summary (Signed)
Physician Discharge Summary  Tina Watkins [id number] DOB: [birth year] DOA: 04/09/2012  PCP: [name], MD  Admit date: 04/09/2012 Discharge date: 04/11/2012  Time spent: 30 minutes  Recommendations for Outpatient Follow-up:  1. Follow up with Dr. 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 Frequent episodes of nonbloody n/v over the last 24 hours with epigastric abd pain, sscp, and lower back pain (chronic but is worse than normal) not able to keep any of her pain meds down. No fever. No dysuria or hematuria. No sob. No swelling. No le edema. This feels similar to prior episodes of gastroparesis flares.    Hospital Course:  Nausea vomiting and mild epigastric pain  - In a patient with history of gastroparesis, this episode was similar to her gastroparesis flare. She is not on reglan at home. - This is most likely 2/2 to UTI. - Improved with Bowel rest IV fluids and reglan. - Gentle IV fluids and Reglan to be continued.    Renal transplant patient.  - No change continue home meds.  UTI  - probably contributing to Nausea and vomiting. - started empirically on rocephin. Change to cipro cont as an outpatient.  Low blood pressures.  - stable. - continu midodrine.   Procedures:  none    Consultations:  renal  Discharge Exam: Filed Vitals:   04/10/12 1800 04/10/12 2158 04/11/12 0609 04/11/12 1000  BP: 98/56 122/64 96/54 91/61   Pulse: 52 52 58 56  Temp: 98.7 F (37.1 C) 98.6 F (37 C) 98.6 F (37 C) 98.8 F (37.1 C)  TempSrc: Oral Oral Oral Oral  Resp: 20 20 18 18   Height:      Weight:  55 kg (121 lb 4.1 oz)    SpO2: 98% 98% 97% 98%    General: A&O x3  Cardiovascular: RRR Respiratory: good air movement CTA B/L  Discharge Instructions  Discharge Orders   Future Orders Complete By Expires     Diet - low sodium heart healthy  As directed     Increase activity slowly  As directed         Medication List    TAKE these medications       ALPRAZolam 0.25 MG tablet  Commonly known as:  XANAX  Take 0.25 mg by mouth 2 (two) times daily as needed. For anxiety     aspirin EC 81 MG tablet  Take 81 mg by mouth daily.     azaTHIOprine 50 MG tablet  Commonly known as:  IMURAN  Take 100 mg by mouth daily.     calcitRIOL 0.25 MCG capsule  Commonly known as:  ROCALTROL  Take 0.25 mcg by mouth daily.     ciprofloxacin 500 MG tablet  Commonly known as:  CIPRO  Take 1 tablet (500 mg total) by mouth 2 (two) times daily.  colchicine 0.6 MG tablet  Take 0.6 mg by mouth daily as needed (For gout.).     epoetin alfa 10000 UNIT/ML injection  Commonly known as:  EPOGEN,PROCRIT  Inject 40,000 Units into the skin every 30 (thirty) days. Takes injection the 1st Friday of every month.     furosemide 40 MG tablet  Commonly known as:  LASIX  Take 40 mg by mouth 2 (two) times daily as needed. Takes 40 mg daily and an additional 40 mg later in day if needed for swelling     Hydrocodone-Acetaminophen 7.5-300 MG Tabs  Take 1-2 tablets by mouth every 6 (six) hours as needed (as needed for Pain).     midodrine 10 MG tablet  Commonly known as:  PROAMATINE  Take 10 mg by mouth 2 (two) times daily.     multivitamin with minerals Tabs  Take 1 tablet by mouth  daily.     omeprazole 40 MG capsule  Commonly known as:  PRILOSEC  Take 40 mg by mouth 2 (two) times daily.     ondansetron 4 MG tablet  Commonly known as:  ZOFRAN  Take 4 mg by mouth every 8 (eight) hours as needed for nausea.     Oxycodone HCl 10 MG Tabs  Take 10 mg by mouth 4 (four) times daily as needed. For pain     oxyCODONE-acetaminophen 10-650 MG per tablet  Commonly known as:  PERCOCET  Take 1 tablet by mouth every 6 (six) hours as needed. For pain.     predniSONE 5 MG tablet  Commonly known as:  DELTASONE  Take 5 mg by mouth daily.     promethazine 25 MG tablet  Commonly known as:  PHENERGAN  Take 25 mg by mouth every 6 (six) hours as needed. For nausea       tacrolimus 1 MG capsule  Commonly known as:  PROGRAF  Take 2-3 mg by mouth 2 (two) times daily. Takes 3 mg in the morning and 2 mg at night          The results of significant diagnostics from this hospitalization (including imaging, microbiology, ancillary and laboratory) are listed below for reference.    Significant Diagnostic Studies: Dg Chest 2 View  04/09/2012  *RADIOLOGY REPORT*  Clinical Data: Chest pain  CHEST - 2 VIEW  Comparison: 03/21/2012  Findings: Lungs are clear. No pleural effusion or pneumothorax.  The heart is normal in size.  SVC stent.  Visualized osseous structures are within normal limits.  Surgical clips in the upper abdomen.  IMPRESSION: No evidence of acute cardiopulmonary disease.   Original Report Authenticated By: Julian Hy, M.D.    Dg Abd Acute W/chest  03/21/2012  *RADIOLOGY REPORT*  Clinical Data: Chest of abdominal pain.  Diarrhea and vomiting. History of right renal transplant.  Hypertension.  Ex-smoker.  ACUTE ABDOMEN SERIES (ABDOMEN 2 VIEW & CHEST 1 VIEW)  Comparison: 01/15/2012 chest film.  Acute abdomen series of 12/10/2011.  Findings: Frontal view of the chest demonstrates midline trachea. Mild cardiomegaly.  Superior vena caval stent.  Right hemidiaphragm  elevation is mild. No pleural effusion or pneumothorax.  No congestive failure.  Mild volume loss at the medial right lung base is similar.  Abominal films demonstrate no free intraperitoneal air or significant air fluid levels on upright positioning.  Extensive surgical changes about the upper abdomen and right greater than left pelvis.  No bowel distention.  Numerous phleboliths in the pelvis. Calcification over the left side of the abdomen  is likely vascular. Left kidney is absent.   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  IMPRESSION: No acute findings.   Original Report Authenticated By: Abigail Miyamoto, M.D.     Microbiology: Recent Results (from the past 240 hour(s))  URINE CULTURE     Status: None   Collection Time    04/09/12  6:11 PM      Result Value Range Status   Specimen Description URINE, CLEAN CATCH   Final   Special Requests NONE   Final   Culture  Setup Time 04/10/2012 03:34   Final   Colony Count >=100,000 COLONIES/ML   Final   Culture     Final   Value: Multiple bacterial morphotypes present, none predominant. Suggest appropriate recollection if clinically indicated.   Report Status 04/11/2012 FINAL   Final     Labs: Basic Metabolic Panel:  Recent Labs Lab 04/09/12 1632 04/10/12 0605 04/11/12 0637  NA 139 136 138  K 3.3* 4.3 3.9  CL 100 101 106  CO2 23 21 23   GLUCOSE 94 90 80  BUN 28* 26* 25*  CREATININE 2.37* 2.35* 2.72*  CALCIUM 11.0* 9.9 9.2   Liver Function Tests:  Recent Labs Lab 04/09/12 1632  AST 14  ALT 6  ALKPHOS 67  BILITOT 0.5  PROT 7.2  ALBUMIN 4.4    Recent Labs Lab 04/09/12 1632  LIPASE 32   No results found for this basename: AMMONIA,  in the last 168 hours CBC:  Recent Labs Lab 04/09/12 1632 04/10/12 0605  WBC 4.7 5.3  NEUTROABS 4.0  --   HGB 10.8* 9.6*  HCT 30.5* 27.1*  MCV 87.4 87.1  PLT 187 186   Cardiac Enzymes:  Recent Labs Lab 04/09/12 2054  TROPONINI <0.30   BNP: BNP (last 3 results) No  results found for this basename: PROBNP,  in the last 8760 hours CBG: No results found for this basename: GLUCAP,  in the last 168 hours     Signed:  Charlynne Cousins  [facility] 04/11/2012, 11:06 AM

## 2012-04-12 LAB — TACROLIMUS LEVEL: Tacrolimus (FK506) - LabCorp: 13.6 ng/mL

## 2012-04-13 ENCOUNTER — Encounter (HOSPITAL_COMMUNITY): Payer: Self-pay | Admitting: *Deleted

## 2012-04-15 ENCOUNTER — Encounter (HOSPITAL_COMMUNITY): Payer: Self-pay | Admitting: Pharmacy Technician

## 2012-04-21 ENCOUNTER — Ambulatory Visit (HOSPITAL_COMMUNITY): Payer: Medicaid Other | Admitting: Anesthesiology

## 2012-04-21 ENCOUNTER — Ambulatory Visit (HOSPITAL_COMMUNITY)
Admission: RE | Admit: 2012-04-21 | Discharge: 2012-04-21 | Disposition: A | Discharge status: Home / Self Care | Payer: Medicaid Other | Source: Ambulatory Visit | Attending: Gastroenterology | Admitting: Gastroenterology

## 2012-04-21 ENCOUNTER — Encounter (HOSPITAL_COMMUNITY)
Admission: RE | Disposition: A | Discharge status: Home / Self Care | Payer: Self-pay | Source: Ambulatory Visit | Attending: Gastroenterology

## 2012-04-21 ENCOUNTER — Encounter (HOSPITAL_COMMUNITY): Payer: Self-pay | Admitting: *Deleted

## 2012-04-21 ENCOUNTER — Encounter (HOSPITAL_COMMUNITY): Payer: Self-pay | Admitting: Anesthesiology

## 2012-04-21 DIAGNOSIS — K2970 Gastritis, unspecified, without bleeding: Secondary | ICD-10-CM | POA: Insufficient documentation

## 2012-04-21 DIAGNOSIS — R112 Nausea with vomiting, unspecified: Secondary | ICD-10-CM | POA: Insufficient documentation

## 2012-04-21 DIAGNOSIS — R1110 Vomiting, unspecified: Secondary | ICD-10-CM

## 2012-04-21 DIAGNOSIS — Z79899 Other long term (current) drug therapy: Secondary | ICD-10-CM | POA: Insufficient documentation

## 2012-04-21 DIAGNOSIS — D649 Anemia, unspecified: Secondary | ICD-10-CM | POA: Insufficient documentation

## 2012-04-21 DIAGNOSIS — K3184 Gastroparesis: Secondary | ICD-10-CM | POA: Insufficient documentation

## 2012-04-21 DIAGNOSIS — K219 Gastro-esophageal reflux disease without esophagitis: Secondary | ICD-10-CM | POA: Insufficient documentation

## 2012-04-21 DIAGNOSIS — N186 End stage renal disease: Secondary | ICD-10-CM | POA: Insufficient documentation

## 2012-04-21 DIAGNOSIS — Z87440 Personal history of urinary (tract) infections: Secondary | ICD-10-CM | POA: Insufficient documentation

## 2012-04-21 DIAGNOSIS — B3781 Candidal esophagitis: Secondary | ICD-10-CM | POA: Insufficient documentation

## 2012-04-21 DIAGNOSIS — Z8673 Personal history of transient ischemic attack (TIA), and cerebral infarction without residual deficits: Secondary | ICD-10-CM | POA: Insufficient documentation

## 2012-04-21 DIAGNOSIS — Z940 Kidney transplant status: Secondary | ICD-10-CM | POA: Insufficient documentation

## 2012-04-21 DIAGNOSIS — K449 Diaphragmatic hernia without obstruction or gangrene: Secondary | ICD-10-CM | POA: Insufficient documentation

## 2012-04-21 HISTORY — PX: ESOPHAGOGASTRODUODENOSCOPY (EGD) WITH PROPOFOL: SHX5813

## 2012-04-21 SURGERY — ESOPHAGOGASTRODUODENOSCOPY (EGD) WITH PROPOFOL
Anesthesia: Monitor Anesthesia Care

## 2012-04-21 MED ORDER — BUTAMBEN-TETRACAINE-BENZOCAINE 2-2-14 % EX AERO
INHALATION_SPRAY | CUTANEOUS | Status: DC | PRN
  Administered 2012-04-21: 2 via TOPICAL

## 2012-04-21 MED ORDER — DEXAMETHASONE SODIUM PHOSPHATE 10 MG/ML IJ SOLN
INTRAMUSCULAR | Status: DC | PRN
  Administered 2012-04-21: 10 mg via INTRAVENOUS

## 2012-04-21 MED ORDER — LACTATED RINGERS IV SOLN
INTRAVENOUS | Status: DC | PRN
  Administered 2012-04-21: 11:00:00 via INTRAVENOUS

## 2012-04-21 MED ORDER — FLUCONAZOLE 100 MG PO TABS: 100.0000 mg | ORAL_TABLET | Freq: Every day | ORAL | Status: DC

## 2012-04-21 MED ORDER — KETAMINE HCL 10 MG/ML IJ SOLN
INTRAMUSCULAR | Status: DC | PRN
  Administered 2012-04-21: 55 mg via INTRAVENOUS

## 2012-04-21 MED ORDER — MIDAZOLAM HCL 5 MG/5ML IJ SOLN
INTRAMUSCULAR | Status: DC | PRN
  Administered 2012-04-21: 2 mg via INTRAVENOUS

## 2012-04-21 MED ORDER — LACTATED RINGERS IV SOLN
INTRAVENOUS | Status: DC
  Administered 2012-04-21: 11:00:00 via INTRAVENOUS

## 2012-04-21 MED ORDER — PROPOFOL 10 MG/ML IV EMUL
INTRAVENOUS | Status: DC | PRN
  Administered 2012-04-21: 300 ug/kg/min via INTRAVENOUS

## 2012-04-21 MED ORDER — SODIUM CHLORIDE 0.9 % IV SOLN: INTRAVENOUS | Status: DC

## 2012-04-21 MED ORDER — PROPOFOL 10 MG/ML IV BOLUS
INTRAVENOUS | Status: DC | PRN
  Administered 2012-04-21 (×2): 50 mg via INTRAVENOUS

## 2012-04-21 SURGICAL SUPPLY — 14 items

## 2012-04-21 NOTE — Preoperative (Signed)
Beta Blockers   Reason not to administer Beta Blockers:Not Applicable, not on home BB 

## 2012-04-21 NOTE — Interval H&P Note (Signed)
History and Physical Interval Note:  04/21/2012 10:35 AM  Tina Watkins  has presented today for surgery, with the diagnosis of Nausea & vomiting [787.01]  The various methods of treatment have been discussed with the patient and family. After consideration of risks, benefits and other options for treatment, the patient has consented to  Procedure(s): ESOPHAGOGASTRODUODENOSCOPY (EGD) WITH PROPOFOL (N/A) as a surgical intervention .  The patient's history has been reviewed, patient examined, no change in status, stable for surgery.  I have reviewed the patient's chart and labs.  Questions were answered to the patient's satisfaction.     Owens Loffler

## 2012-04-21 NOTE — Anesthesia Preprocedure Evaluation (Addendum)
Anesthesia Evaluation  Patient identified by MRN, date of birth, ID band Patient awake    Reviewed: Allergy & Precautions, H&P , NPO status , Patient's Chart, lab work & pertinent test results  History of Anesthesia Complications Negative for: history of anesthetic complications  Airway Mallampati: III TM Distance: >3 FB Neck ROM: Full    Dental  (+) Dental Advisory Given, Teeth Intact and Chipped   Pulmonary former smoker,  breath sounds clear to auscultation        Cardiovascular + dysrhythmias Supra Ventricular Tachycardia Rhythm:Regular Rate:Normal     Neuro/Psych  Headaches, Seizures -,  PSYCHIATRIC DISORDERS Anxiety Depression CVA, No Residual Symptoms    GI/Hepatic Neg liver ROS, GERD-  Medicated,  Endo/Other  negative endocrine ROS  Renal/GU CRF and ARFRenal diseaseS/P transplant     Musculoskeletal negative musculoskeletal ROS (+)   Abdominal   Peds  Hematology negative hematology ROS (+) Blood dyscrasia, anemia ,   Anesthesia Other Findings   Reproductive/Obstetrics negative OB ROS                       Anesthesia Physical Anesthesia Plan  ASA: III  Anesthesia Plan: MAC   Post-op Pain Management:    Induction: Intravenous  Airway Management Planned:   Additional Equipment:   Intra-op Plan:   Post-operative Plan:   Informed Consent: I have reviewed the patients History and Physical, chart, labs and discussed the procedure including the risks, benefits and alternatives for the proposed anesthesia with the patient or authorized representative who has indicated his/her understanding and acceptance.   Dental advisory given  Plan Discussed with: CRNA  Anesthesia Plan Comments:         Anesthesia Quick Evaluation

## 2012-04-21 NOTE — Anesthesia Postprocedure Evaluation (Signed)
Anesthesia Post Note  Patient: Tina Watkins  Procedure(s) Performed: Procedure(s) (LRB): ESOPHAGOGASTRODUODENOSCOPY (EGD) WITH PROPOFOL (N/A)  Anesthesia type: MAC  Patient location: PACU  Post pain: Pain level controlled  Post assessment: Post-op Vital signs reviewed  Last Vitals: BP 132/81  Pulse 87  Temp(Src) 36.8 C (Oral)  Resp 20  Ht 4\' 11"  (1.499 m)  Wt 121 lb (54.885 kg)  BMI 24.43 kg/m2  SpO2 100%  Post vital signs: Reviewed  Level of consciousness: awake  Complications: No apparent anesthesia complications

## 2012-04-21 NOTE — Op Note (Signed)
[facility] [place], [address]   ENDOSCOPY PROCEDURE REPORT  PATIENT: Tina, Watkins  MR#: [id number] BIRTHDATE: 15-Oct-1965 , 46  yrs. old GENDER: Female ENDOSCOPIST: Milus Banister, MD PROCEDURE DATE:  04/21/2012 PROCEDURE:  EGD w/ biopsy ASA CLASS:     Class IV INDICATIONS:  nausea, vomiting; known gastroparesis, recent UTI. MEDICATIONS: MAC sedation, administered by CRNA TOPICAL ANESTHETIC: Cetacaine Spray  DESCRIPTION OF PROCEDURE: After the risks benefits and alternatives of the procedure were thoroughly explained, informed consent was obtained.  The Pentax Gastroscope [id number] endoscope was introduced through the mouth and advanced to the second portion of the duodenum. Without limitations.  The instrument was slowly withdrawn as the mucosa was fully examined.    There were several areas of yellow-whitish exudate in esophagus which did not wash free with flushes.  There was mild, non-specific distal gastritis.  This was biopsied and sent to pathology.  There was a small amount of retained solid food.  The examination was otherwise normal.  Retroflexed views revealed no abnormalities. The scope was then withdrawn from the patient and the procedure completed. COMPLICATIONS: There were no complications.  ENDOSCOPIC IMPRESSION: There were several areas of yellow-whitish exudate in esophagus which did not wash free with flushes.  There was mild, non-specific distal gastritis.  This was biopsied and sent to pathology.  There was a small amount of retained solid food.  The examination was otherwise normal.  RECOMMENDATIONS: Will treat candida esophagitis with 10 days of diflucan.  This was called into your pharmacy already.  If biopsies show H.  pylori, will put you on appropriate antibiotics.   eSigned:  Milus Banister, MD 04/21/2012 11:34 AM

## 2012-04-21 NOTE — Transfer of Care (Signed)
Immediate Anesthesia Transfer of Care Note  Patient: Tina Watkins  Procedure(s) Performed: Procedure(s): ESOPHAGOGASTRODUODENOSCOPY (EGD) WITH PROPOFOL (N/A)  Patient Location: PACU  Anesthesia Type:MAC  Level of Consciousness: awake and responds to stimulation, drowsy, ventilating well  Airway & Oxygen Therapy: Patient Spontanous Breathing and Patient connected to nasal cannula oxygen  Post-op Assessment: Report given to PACU RN, Post -op Vital signs reviewed and stable and Patient moving all extremities X 4  Post vital signs: Reviewed and stable  Complications: No apparent anesthesia complications

## 2012-04-21 NOTE — H&P (View-Only) (Signed)
Review of pertinent gastrointestinal problems: 1. Chronic nausea, vomiting. EGD August 2009 was normal except for small hiatal hernia. Status post kidney transplant on immunosuppressive medicines which could cause nausea, narcotics periodically. Normal complete metabolic profile, slight anemia, labs July 2009. Changing her Myfortic to Imuran helped nausea temporarily, symptoms returned. GES 03/2008 slightly slow emptying. [name] 2/201normal GB, normal bilary tree. Hospitalized 03/2008 [facility] MD put her on reglan 5mg  three times a day, GI was not consulted. March, 2010 combination of daily scheduled Zofran and t.i.d. Reglan 5 mg a day has been helpful. She was cut back to Reglan as needed to due concern for potential side effects. June 2011: Was recommended to take 1-2 Reglan pills at bedtime only. Also started Megace for appetite stimulant. January 2013: She has gained 20 pounds and her nausea is under much better control with nightly Reglan.   She was in ER last week with 24 hour vomiting, then called here the next day for pain meds since her throat hurt so badly.  Limited narcotic script called in.   HPI: This is a   very pleasant 47 year old woman whom I last saw about one year ago.  Has been having a lot of vomiting, nausea.  A year ago was doing well on nightly reglan.  She has stayed on this nightly for the past year.    Rarely needs pain meds, maybe once per month does she need narcotic pain medicine.  zofran can help.  Feels nauseas just about daily now.     Past Medical History  Diagnosis Date  . Gastroparesis   . Gout   . Hypotension   . Herpes genitalia 1994  . E coli bacteremia 06/18/2011  . Bacteremia due to Gram-negative bacteria 05/23/2011  . ESRD (end stage renal disease) 06/12/2011  . Complication of anesthesia     "woke up during OR; I have an extremely high tolerance" (12/11/2011)  . Dysrhythmia     "tachycardia" (12/11/2011)  . Iron deficiency anemia   . History of  blood transfusion     "more than a few times" (12/11/2011)  . History of stomach ulcers   . GERD (gastroesophageal reflux disease)   . Headache     "not often anymore" (12/11/2011)  . Seizures 1994    "post transplant; only have had that one" (12/11/2011)  . Stroke      left basal ganglia lacunar infarct; Right frontal lobe lacunar infarct.  . Stroke ~ 1999; 2001    "briefly lost my vision; lost my right eye" (12/11/2011)  . Chronic lower back pain   . Depression   . Osteopenia     Past Surgical History  Procedure Laterality Date  . Kidney transplant  1994; 1999; 2005    "right"  . Appendectomy  ~ 2004  . Enucleation  2001    "right"  . 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 Total nephrectomy  1988?; 1994; 2005  . Multiple tooth extractions      Current Outpatient Prescriptions  Medication Sig Dispense Refill  . ALPRAZolam (XANAX) 0.25 MG tablet Take 0.25 mg by mouth 2 (two) times daily as needed. For anxiety      . aspirin EC 81 MG tablet Take 81 mg by mouth daily.      [name] azaTHIOprine (IMURAN) 50 MG tablet Take 100 mg by mouth daily.      . calcitRIOL (ROCALTROL) 0.25  MCG capsule Take 0.25 mcg by mouth daily.      . colchicine 0.6 MG tablet Take 0.6 mg by mouth daily as needed (For gout.).      [name] epoetin alfa (EPOGEN,PROCRIT) 16109 UNIT/ML injection Inject 40,000 Units into the skin every 30 (thirty) days. Takes injection the 1st Friday of every month.      . furosemide (LASIX) 40 MG tablet Take 40 mg by mouth 2 (two) times daily as needed. Takes 40 mg daily and an additional 40 mg later in day if needed for swelling      . Hydrocodone-Acetaminophen 7.5-300 MG TABS Take 1-2 tablets by mouth every 6 (six) hours as needed (as needed for Pain).  20 each  0  . midodrine (PROAMATINE) 10 MG tablet Take 10 mg by mouth 2 (two) times daily.      . Multiple Vitamin (MULITIVITAMIN WITH MINERALS) TABS Take 1 tablet by mouth  daily.      [name] omeprazole (PRILOSEC) 40 MG capsule Take 40 mg by mouth 2 (two) times daily.       . Oxycodone HCl 10 MG TABS Take 10 mg by mouth 4 (four) times daily as needed. For pain      . oxyCODONE-acetaminophen (PERCOCET) 10-650 MG per tablet Take 1 tablet by mouth every 6 (six) hours as needed. For pain.      . predniSONE (DELTASONE) 5 MG tablet Take 5 mg by mouth daily.      . promethazine (PHENERGAN) 25 MG tablet Take 25 mg by mouth every 6 (six) hours as needed. For nausea       . tacrolimus (PROGRAF) 1 MG capsule Take 2-3 mg by mouth 2 (two) times daily. Takes 3 mg in the morning and 2 mg at night       No current facility-administered medications for this visit.    Allergies as of 03/29/2012 - Review Complete 03/29/2012  Allergen Reaction Noted  . Levofloxacin Rash 05/23/2011    Family History  Problem Relation Age of Onset  . Hypertension Maternal Grandmother     History   Social History  . Marital Status: Single    Spouse Name: N/A    Number of Children: 0  . Years of Education: N/A   Occupational History  . disabiled    Social History Main Topics  . Smoking status: Former Smoker -- 0.50 packs/day for 28 years    Types: Cigarettes    Quit date: 03/05/2011  . Smokeless tobacco: Never Used  . Alcohol Use: Yes     Comment: 12/11/2011 "rarely have a mixed drink"  . Drug Use: Yes    Special: Marijuana     Comment: 12/11/2011 "last marijuana was 2 days ago; helps my appetite"  . Sexually Active: Yes   Other Topics Concern  . Not on file   Social History Narrative  . No narrative on file      Physical Exam: BP 108/62  Pulse 60  Ht 4\' 11"  (1.499 m)  Wt 126 lb (57.153 kg)  BMI 25.44 kg/m2 Constitutional: generally well-appearing Psychiatric: alert and oriented x3 Abdomen: soft, nontender, nondistended, no obvious ascites, no peritoneal signs, normal bowel sounds     Assessment and plan: 47 y.o. female with multifactorial nausea  She has  documented mild gastroparesis. She is on immunomodulators which have a side effect of nausea. Previously her nausea and vomiting were under good control and once nightly Reglan. For the past 2 weeks since she was hospitalized she has  had been taking Reglan about 4 times a day and has not noticed an improvement. She has several bottles of Zofran still and I recommended she take 1 Zofran shortly after she wakes up every morning and continue the nightly Reglan. Her last upper endoscopy was 4-5 years ago I would like to repeat that now to see if anything significant change such as H. pylori gastritis or ulcer disease.

## 2012-04-22 ENCOUNTER — Encounter (HOSPITAL_COMMUNITY): Payer: Self-pay | Admitting: Gastroenterology

## 2012-05-04 ENCOUNTER — Other Ambulatory Visit: Payer: Self-pay | Admitting: Gastroenterology

## 2012-05-06 ENCOUNTER — Encounter (HOSPITAL_COMMUNITY)
Admission: RE | Admit: 2012-05-06 | Discharge: 2012-05-06 | Disposition: A | Discharge status: Home / Self Care | Payer: Medicaid Other | Source: Ambulatory Visit | Attending: Nephrology | Admitting: Nephrology

## 2012-05-06 DIAGNOSIS — D638 Anemia in other chronic diseases classified elsewhere: Secondary | ICD-10-CM | POA: Insufficient documentation

## 2012-05-06 DIAGNOSIS — N183 Chronic kidney disease, stage 3 unspecified: Secondary | ICD-10-CM | POA: Insufficient documentation

## 2012-05-06 LAB — RENAL FUNCTION PANEL
Albumin: 4.2 g/dL (ref 3.5–5.2)
BUN: 60 mg/dL — ABNORMAL HIGH (ref 6–23)
CO2: 27 mEq/L (ref 19–32)
Calcium: 10.1 mg/dL (ref 8.4–10.5)
Chloride: 100 mEq/L (ref 96–112)
Creatinine, Ser: 5.59 mg/dL — ABNORMAL HIGH (ref 0.50–1.10)
GFR calc Af Amer: 10 mL/min — ABNORMAL LOW (ref >90)
GFR calc non Af Amer: 8 mL/min — ABNORMAL LOW (ref >90)
Glucose, Bld: 75 mg/dL (ref 70–99)
Phosphorus: 3.9 mg/dL (ref 2.3–4.6)
Potassium: 4.7 mEq/L (ref 3.5–5.1)
Sodium: 136 mEq/L (ref 135–145)

## 2012-05-06 LAB — IRON AND TIBC
Iron: 76 ug/dL (ref 42–135)
Saturation Ratios: 33 % (ref 20–55)
TIBC: 233 ug/dL — ABNORMAL LOW (ref 250–470)
UIBC: 157 ug/dL (ref 125–400)

## 2012-05-06 LAB — POCT HEMOGLOBIN-HEMACUE: Hemoglobin: 9.3 g/dL — ABNORMAL LOW (ref 12.0–15.0)

## 2012-05-06 MED ORDER — DARBEPOETIN ALFA-POLYSORBATE 60 MCG/0.3ML IJ SOLN
INTRAMUSCULAR | Status: AC
  Filled 2012-05-06: qty 0.3

## 2012-05-06 MED ORDER — DARBEPOETIN ALFA-POLYSORBATE 60 MCG/0.3ML IJ SOLN
60.0000 ug | INTRAMUSCULAR | Status: DC
  Administered 2012-05-06: 60 ug via SUBCUTANEOUS

## 2012-05-07 LAB — FERRITIN: Ferritin: 1703 ng/mL — ABNORMAL HIGH (ref 10–291)

## 2012-05-09 ENCOUNTER — Emergency Department (HOSPITAL_COMMUNITY)
Admission: EM | Admit: 2012-05-09 | Discharge: 2012-05-09 | Disposition: A | Discharge status: Home / Self Care | Payer: Medicaid Other | Attending: Emergency Medicine | Admitting: Emergency Medicine

## 2012-05-09 ENCOUNTER — Encounter (HOSPITAL_COMMUNITY): Payer: Self-pay | Admitting: *Deleted

## 2012-05-09 ENCOUNTER — Telehealth: Payer: Self-pay | Admitting: Gastroenterology

## 2012-05-09 DIAGNOSIS — E860 Dehydration: Secondary | ICD-10-CM

## 2012-05-09 DIAGNOSIS — K219 Gastro-esophageal reflux disease without esophagitis: Secondary | ICD-10-CM | POA: Insufficient documentation

## 2012-05-09 DIAGNOSIS — I10 Essential (primary) hypertension: Secondary | ICD-10-CM | POA: Insufficient documentation

## 2012-05-09 DIAGNOSIS — K3184 Gastroparesis: Secondary | ICD-10-CM | POA: Insufficient documentation

## 2012-05-09 DIAGNOSIS — Z8719 Personal history of other diseases of the digestive system: Secondary | ICD-10-CM | POA: Insufficient documentation

## 2012-05-09 DIAGNOSIS — Z3202 Encounter for pregnancy test, result negative: Secondary | ICD-10-CM | POA: Insufficient documentation

## 2012-05-09 LAB — CBC WITH DIFFERENTIAL/PLATELET
Basophils Absolute: 0 10*3/uL (ref 0.0–0.1)
Basophils Relative: 0 % (ref 0–1)
Eosinophils Absolute: 0 10*3/uL (ref 0.0–0.7)
Eosinophils Relative: 1 % (ref 0–5)
HCT: 30.7 % — ABNORMAL LOW (ref 36.0–46.0)
Hemoglobin: 11.1 g/dL — ABNORMAL LOW (ref 12.0–15.0)
Lymphocytes Relative: 12 % (ref 12–46)
Lymphs Abs: 0.5 10*3/uL — ABNORMAL LOW (ref 0.7–4.0)
MCH: 30.9 pg (ref 26.0–34.0)
MCHC: 36.2 g/dL — ABNORMAL HIGH (ref 30.0–36.0)
MCV: 85.5 fL (ref 78.0–100.0)
Monocytes Absolute: 0.2 10*3/uL (ref 0.1–1.0)
Monocytes Relative: 5 % (ref 3–12)
Neutro Abs: 3.3 10*3/uL (ref 1.7–7.7)
Neutrophils Relative %: 82 % — ABNORMAL HIGH (ref 43–77)
Platelets: 211 10*3/uL (ref 150–400)
RBC: 3.59 MIL/uL — ABNORMAL LOW (ref 3.87–5.11)
RDW: 17.9 % — ABNORMAL HIGH (ref 11.5–15.5)
WBC: 4 10*3/uL (ref 4.0–10.5)

## 2012-05-09 LAB — URINALYSIS, ROUTINE W REFLEX MICROSCOPIC
Bilirubin Urine: NEGATIVE
Glucose, UA: NEGATIVE mg/dL
Hgb urine dipstick: NEGATIVE
Ketones, ur: 15 mg/dL — AB
Leukocytes, UA: NEGATIVE
Nitrite: NEGATIVE
Protein, ur: 30 mg/dL — AB
Specific Gravity, Urine: 1.016 (ref 1.005–1.030)
Urobilinogen, UA: 0.2 mg/dL (ref 0.0–1.0)
pH: 6.5 (ref 5.0–8.0)

## 2012-05-09 LAB — COMPREHENSIVE METABOLIC PANEL
ALT: 7 U/L (ref 0–35)
AST: 17 U/L (ref 0–37)
Albumin: 4.6 g/dL (ref 3.5–5.2)
Alkaline Phosphatase: 82 U/L (ref 39–117)
BUN: 49 mg/dL — ABNORMAL HIGH (ref 6–23)
CO2: 23 mEq/L (ref 19–32)
Calcium: 10.7 mg/dL — ABNORMAL HIGH (ref 8.4–10.5)
Chloride: 102 mEq/L (ref 96–112)
Creatinine, Ser: 3.86 mg/dL — ABNORMAL HIGH (ref 0.50–1.10)
GFR calc Af Amer: 15 mL/min — ABNORMAL LOW (ref >90)
GFR calc non Af Amer: 13 mL/min — ABNORMAL LOW (ref >90)
Glucose, Bld: 101 mg/dL — ABNORMAL HIGH (ref 70–99)
Potassium: 4.5 mEq/L (ref 3.5–5.1)
Sodium: 140 mEq/L (ref 135–145)
Total Bilirubin: 0.4 mg/dL (ref 0.3–1.2)
Total Protein: 7.8 g/dL (ref 6.0–8.3)

## 2012-05-09 LAB — URINE MICROSCOPIC-ADD ON

## 2012-05-09 LAB — POCT PREGNANCY, URINE: Preg Test, Ur: NEGATIVE

## 2012-05-09 MED ORDER — SODIUM CHLORIDE 0.9 % IV BOLUS (SEPSIS)
1000.0000 mL | Freq: Once | INTRAVENOUS | Status: AC
  Administered 2012-05-09: 1000 mL via INTRAVENOUS

## 2012-05-09 MED ORDER — HYDROMORPHONE HCL PF 1 MG/ML IJ SOLN
1.0000 mg | Freq: Once | INTRAMUSCULAR | Status: AC
  Administered 2012-05-09: 1 mg via INTRAVENOUS
  Filled 2012-05-09: qty 1

## 2012-05-09 MED ORDER — ONDANSETRON 4 MG PO TBDP
8.0000 mg | ORAL_TABLET | Freq: Once | ORAL | Status: AC
  Administered 2012-05-09: 8 mg via ORAL
  Filled 2012-05-09: qty 2

## 2012-05-09 MED ORDER — METOCLOPRAMIDE HCL 5 MG/ML IJ SOLN
10.0000 mg | Freq: Once | INTRAMUSCULAR | Status: AC
  Administered 2012-05-09: 10 mg via INTRAVENOUS
  Filled 2012-05-09: qty 2

## 2012-05-09 NOTE — Telephone Encounter (Signed)
Left message on machine to call back  

## 2012-05-09 NOTE — ED Notes (Signed)
Started with nausea and vomiting last Wednesday

## 2012-05-10 NOTE — Telephone Encounter (Signed)
Left message on machine to call back I will await further communication from the pt

## 2012-05-13 NOTE — ED Provider Notes (Signed)
History     CSN: [id number]  Arrival date & time 05/09/12  [id number]   First MD Initiated Contact with Patient 05/09/12 [phone]      Chief Complaint  Patient presents with  . Emesis    (Consider location/radiation/quality/duration/timing/severity/associated sxs/prior treatment) HPI The patient presents to the ER with her usual gastroparesis issues. The patient states that this began three days ago but got worse. The patient denies chest pain, SOB, weakness, headache, blurred vision, fever, dizziness, syncope, lethargy, or diarrhea. The patient states that her home medications are not working. The patient states that she has seen her doctor for this as well.  Past Medical History  Diagnosis Date  . Gastroparesis   . Gout   . Hypotension   . Herpes genitalia 1994  . E coli bacteremia 06/18/2011  . Bacteremia due to Gram-negative bacteria 05/23/2011  . History of blood transfusion     "more than a few times" (12/11/2011)  . History of stomach ulcers   . Stroke      left basal ganglia lacunar infarct; Right frontal lobe lacunar infarct.  . Stroke ~ 1999; 2001    "briefly lost my vision; lost my right eye" (12/11/2011)  . Chronic lower back pain   . Depression   . Osteopenia   . Headache     "not often anymore" (12/11/2011)  . Complication of anesthesia     "woke up during OR; I have an extremely high tolerance" (12/11/2011)  . Dysrhythmia     "tachycardia" (12/11/2011)  . ESRD (end stage renal disease) 06/12/2011  . Iron deficiency anemia   . Seizures 1994    "post transplant; only have had that one" (12/11/2011)  . GERD (gastroesophageal reflux disease)     Past Surgical History  Procedure Laterality Date  . Kidney transplant  1994; 1999; 2005    "right"  . Appendectomy  ~ 2004  . 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 Total nephrectomy  1988?; 1994; 2005  . Multiple tooth extractions    . Enucleation  2001   "right"  . Esophagogastroduodenoscopy (egd) with propofol N/A 04/21/2012    Procedure: ESOPHAGOGASTRODUODENOSCOPY (EGD) WITH PROPOFOL;  Surgeon: Milus Banister, MD;  Location: [facility];  Service: Endoscopy;  Laterality: N/A;    Family History  Problem Relation Age of Onset  . Hypertension Maternal Grandmother     History  Substance Use Topics  . Smoking status: Former Smoker -- 0.50 packs/day for 28 years    Types: Cigarettes    Quit date: 03/05/2011  . Smokeless tobacco: Never Used  . Alcohol Use: Yes     Comment: uses daily    OB History   Grav Para Term Preterm Abortions TAB SAB Ect Mult Living                  Review of Systems All other systems negative except as documented in the HPI. All pertinent positives and negatives as reviewed in the HPI.  Allergies  Levofloxacin  Home Medications   Current Outpatient Rx  Name  Route  Sig  Dispense  Refill  . ALPRAZolam (XANAX) 0.25 MG tablet   Oral   Take 0.25 mg by mouth 2 (two) times daily as needed. For anxiety         . aspirin EC 81 MG tablet   Oral   Take 81 mg by mouth daily.         [name]  azaTHIOprine (IMURAN) 50 MG tablet   Oral   Take 100 mg by mouth daily before breakfast.          . calcitRIOL (ROCALTROL) 0.25 MCG capsule   Oral   Take 0.25 mcg by mouth daily before breakfast.          . Darbepoetin Alfa-Polysorbate (ARANESP, ALB FREE, SURECLICK IJ)   Injection   Inject as directed.         . furosemide (LASIX) 40 MG tablet   Oral   Take 40 mg by mouth every evening.          [name] Hydrocodone-Acetaminophen 7.5-300 MG TABS   Oral   Take 1-2 tablets by mouth every 6 (six) hours as needed (as needed for Pain).   20 each   0   . metoCLOPramide (REGLAN) 10 MG tablet   Oral   Take 10 mg by mouth 2 (two) times daily.         . midodrine (PROAMATINE) 10 MG tablet   Oral   Take 10 mg by mouth 2 (two) times daily.         . Multiple Vitamin (MULITIVITAMIN WITH MINERALS) TABS    Oral   Take 1 tablet by mouth daily.         [name] omeprazole (PRILOSEC) 40 MG capsule   Oral   Take 40 mg by mouth 2 (two) times daily.          . ondansetron (ZOFRAN) 4 MG tablet   Oral   Take 4 mg by mouth every 8 (eight) hours as needed for nausea.         . Oxycodone HCl 10 MG TABS   Oral   Take 10 mg by mouth 4 (four) times daily as needed. For pain         . oxyCODONE-acetaminophen (PERCOCET) 10-650 MG per tablet   Oral   Take 1 tablet by mouth every 6 (six) hours as needed. For pain.         . predniSONE (DELTASONE) 5 MG tablet   Oral   Take 5 mg by mouth daily.         . promethazine (PHENERGAN) 25 MG tablet   Oral   Take 25 mg by mouth every 6 (six) hours as needed. For nausea          . tacrolimus (PROGRAF) 1 MG capsule   Oral   Take 3 mg by mouth 2 (two) times daily.          . colchicine 0.6 MG tablet   Oral   Take 0.6 mg by mouth daily as needed (For gout.).           BP 105/59  Pulse 67  Temp(Src) 98.3 F (36.8 C) (Oral)  Resp 16  Ht 4\' 11"  (1.499 m)  Wt 120 lb (54.432 kg)  BMI 24.22 kg/m2  SpO2 100%  Physical Exam  Nursing note and vitals reviewed. Constitutional: She is oriented to person, place, and time. She appears well-developed and well-nourished.  HENT:  Head: Normocephalic and atraumatic.  Mouth/Throat: Uvula is midline. Mucous membranes are dry. No oropharyngeal exudate, posterior oropharyngeal edema, posterior oropharyngeal erythema or tonsillar abscesses.  Eyes: Pupils are equal, round, and reactive to light.  Neck: Normal range of motion. Neck supple.  Cardiovascular: Normal rate, regular rhythm and normal heart sounds.  Exam reveals no gallop and no friction rub.   No murmur heard. Pulmonary/Chest: Effort normal and breath sounds normal.  Abdominal: Soft. Bowel sounds are normal. She exhibits no distension. There is no tenderness. There is no guarding.  Neurological: She is alert and oriented to person, place, and  time.  Skin: Skin is warm and dry. No rash noted.    ED Course  Procedures (including critical care time)  Labs Reviewed  CBC WITH DIFFERENTIAL - Abnormal; Notable for the following:    RBC 3.59 (*)    Hemoglobin 11.1 (*)    HCT 30.7 (*)    MCHC 36.2 (*)    RDW 17.9 (*)    Neutrophils Relative 82 (*)    Lymphs Abs 0.5 (*)    All other components within normal limits  URINALYSIS, ROUTINE W REFLEX MICROSCOPIC - Abnormal; Notable for the following:    Ketones, ur 15 (*)    Protein, ur 30 (*)    All other components within normal limits  COMPREHENSIVE METABOLIC PANEL - Abnormal; Notable for the following:    Glucose, Bld 101 (*)    BUN 49 (*)    Creatinine, Ser 3.86 (*)    Calcium 10.7 (*)    GFR calc non Af Amer 13 (*)    GFR calc Af Amer 15 (*)    All other components within normal limits  URINE MICROSCOPIC-ADD ON  POCT PREGNANCY, URINE    1. Dehydration   2. Gastroparesis    patient tolerated oral fluids and is feeling vastly improved.  I advised her, that she'll need follow with her primary care Dr. for recheck.  She will be sent home with instructions and return here as needed.  Patient is given fluids.    MDM  MDM Reviewed: nursing note, vitals and previous chart Reviewed previous: labs Interpretation: labs       Brent General, PA-C 05/13/12 [id number]

## 2012-05-14 NOTE — ED Provider Notes (Signed)
Medical screening examination/treatment/procedure(s) were performed by non-physician practitioner and as supervising physician I was immediately available for consultation/collaboration.  Leota Jacobsen, MD 05/14/12 [phone]

## 2012-05-24 ENCOUNTER — Other Ambulatory Visit: Payer: Self-pay | Admitting: Gastroenterology

## 2012-06-02 ENCOUNTER — Encounter (HOSPITAL_COMMUNITY): Payer: Self-pay

## 2012-06-02 ENCOUNTER — Emergency Department (HOSPITAL_COMMUNITY)
Admission: EM | Admit: 2012-06-02 | Discharge: 2012-06-02 | Disposition: A | Discharge status: Home / Self Care | Payer: Medicaid Other | Attending: Emergency Medicine | Admitting: Emergency Medicine

## 2012-06-02 DIAGNOSIS — M549 Dorsalgia, unspecified: Secondary | ICD-10-CM | POA: Insufficient documentation

## 2012-06-02 DIAGNOSIS — M109 Gout, unspecified: Secondary | ICD-10-CM | POA: Insufficient documentation

## 2012-06-02 DIAGNOSIS — N289 Disorder of kidney and ureter, unspecified: Secondary | ICD-10-CM | POA: Insufficient documentation

## 2012-06-02 DIAGNOSIS — Z7982 Long term (current) use of aspirin: Secondary | ICD-10-CM | POA: Insufficient documentation

## 2012-06-02 DIAGNOSIS — M545 Low back pain, unspecified: Secondary | ICD-10-CM | POA: Insufficient documentation

## 2012-06-02 DIAGNOSIS — F329 Major depressive disorder, single episode, unspecified: Secondary | ICD-10-CM | POA: Insufficient documentation

## 2012-06-02 DIAGNOSIS — F3289 Other specified depressive episodes: Secondary | ICD-10-CM | POA: Insufficient documentation

## 2012-06-02 DIAGNOSIS — M899 Disorder of bone, unspecified: Secondary | ICD-10-CM | POA: Insufficient documentation

## 2012-06-02 DIAGNOSIS — Z79899 Other long term (current) drug therapy: Secondary | ICD-10-CM | POA: Insufficient documentation

## 2012-06-02 DIAGNOSIS — K219 Gastro-esophageal reflux disease without esophagitis: Secondary | ICD-10-CM | POA: Insufficient documentation

## 2012-06-02 DIAGNOSIS — K3184 Gastroparesis: Secondary | ICD-10-CM | POA: Insufficient documentation

## 2012-06-02 DIAGNOSIS — I499 Cardiac arrhythmia, unspecified: Secondary | ICD-10-CM | POA: Insufficient documentation

## 2012-06-02 DIAGNOSIS — Z8742 Personal history of other diseases of the female genital tract: Secondary | ICD-10-CM | POA: Insufficient documentation

## 2012-06-02 DIAGNOSIS — Z9189 Other specified personal risk factors, not elsewhere classified: Secondary | ICD-10-CM | POA: Insufficient documentation

## 2012-06-02 DIAGNOSIS — R569 Unspecified convulsions: Secondary | ICD-10-CM | POA: Insufficient documentation

## 2012-06-02 DIAGNOSIS — G8929 Other chronic pain: Secondary | ICD-10-CM | POA: Insufficient documentation

## 2012-06-02 DIAGNOSIS — M949 Disorder of cartilage, unspecified: Secondary | ICD-10-CM | POA: Insufficient documentation

## 2012-06-02 DIAGNOSIS — Z940 Kidney transplant status: Secondary | ICD-10-CM | POA: Insufficient documentation

## 2012-06-02 DIAGNOSIS — Z8719 Personal history of other diseases of the digestive system: Secondary | ICD-10-CM | POA: Insufficient documentation

## 2012-06-02 DIAGNOSIS — R112 Nausea with vomiting, unspecified: Secondary | ICD-10-CM | POA: Insufficient documentation

## 2012-06-02 DIAGNOSIS — Z8673 Personal history of transient ischemic attack (TIA), and cerebral infarction without residual deficits: Secondary | ICD-10-CM | POA: Insufficient documentation

## 2012-06-02 DIAGNOSIS — N189 Chronic kidney disease, unspecified: Secondary | ICD-10-CM

## 2012-06-02 DIAGNOSIS — Z87891 Personal history of nicotine dependence: Secondary | ICD-10-CM | POA: Insufficient documentation

## 2012-06-02 DIAGNOSIS — Z881 Allergy status to other antibiotic agents status: Secondary | ICD-10-CM | POA: Insufficient documentation

## 2012-06-02 DIAGNOSIS — R1110 Vomiting, unspecified: Secondary | ICD-10-CM

## 2012-06-02 LAB — CBC WITH DIFFERENTIAL/PLATELET
Basophils Absolute: 0 10*3/uL (ref 0.0–0.1)
Basophils Relative: 0 % (ref 0–1)
Eosinophils Absolute: 0.1 10*3/uL (ref 0.0–0.7)
Eosinophils Relative: 1 % (ref 0–5)
HCT: 28.6 % — ABNORMAL LOW (ref 36.0–46.0)
Hemoglobin: 10 g/dL — ABNORMAL LOW (ref 12.0–15.0)
Lymphocytes Relative: 18 % (ref 12–46)
Lymphs Abs: 0.8 10*3/uL (ref 0.7–4.0)
MCH: 30.3 pg (ref 26.0–34.0)
MCHC: 35 g/dL (ref 30.0–36.0)
MCV: 86.7 fL (ref 78.0–100.0)
Monocytes Absolute: 0.3 10*3/uL (ref 0.1–1.0)
Monocytes Relative: 7 % (ref 3–12)
Neutro Abs: 3.3 10*3/uL (ref 1.7–7.7)
Neutrophils Relative %: 73 % (ref 43–77)
Platelets: 175 10*3/uL (ref 150–400)
RBC: 3.3 MIL/uL — ABNORMAL LOW (ref 3.87–5.11)
RDW: 18.1 % — ABNORMAL HIGH (ref 11.5–15.5)
WBC: 4.5 10*3/uL (ref 4.0–10.5)

## 2012-06-02 LAB — COMPREHENSIVE METABOLIC PANEL
ALT: 8 U/L (ref 0–35)
AST: 18 U/L (ref 0–37)
Albumin: 4.5 g/dL (ref 3.5–5.2)
Alkaline Phosphatase: 71 U/L (ref 39–117)
BUN: 34 mg/dL — ABNORMAL HIGH (ref 6–23)
CO2: 22 mEq/L (ref 19–32)
Calcium: 10.6 mg/dL — ABNORMAL HIGH (ref 8.4–10.5)
Chloride: 101 mEq/L (ref 96–112)
Creatinine, Ser: 2.69 mg/dL — ABNORMAL HIGH (ref 0.50–1.10)
GFR calc Af Amer: 24 mL/min — ABNORMAL LOW (ref >90)
GFR calc non Af Amer: 20 mL/min — ABNORMAL LOW (ref >90)
Glucose, Bld: 76 mg/dL (ref 70–99)
Potassium: 4.2 mEq/L (ref 3.5–5.1)
Sodium: 140 mEq/L (ref 135–145)
Total Bilirubin: 0.6 mg/dL (ref 0.3–1.2)
Total Protein: 7.5 g/dL (ref 6.0–8.3)

## 2012-06-02 LAB — URINE MICROSCOPIC-ADD ON

## 2012-06-02 LAB — URINALYSIS, ROUTINE W REFLEX MICROSCOPIC
Glucose, UA: NEGATIVE mg/dL
Hgb urine dipstick: NEGATIVE
Ketones, ur: 15 mg/dL — AB
Leukocytes, UA: NEGATIVE
Nitrite: NEGATIVE
Protein, ur: 30 mg/dL — AB
Specific Gravity, Urine: 1.017 (ref 1.005–1.030)
Urobilinogen, UA: 0.2 mg/dL (ref 0.0–1.0)
pH: 5.5 (ref 5.0–8.0)

## 2012-06-02 LAB — LIPASE, BLOOD: Lipase: 48 U/L (ref 11–59)

## 2012-06-02 LAB — POCT PREGNANCY, URINE: Preg Test, Ur: NEGATIVE

## 2012-06-02 LAB — CG4 I-STAT (LACTIC ACID): Lactic Acid, Venous: 1.05 mmol/L (ref 0.5–2.2)

## 2012-06-02 MED ORDER — SODIUM CHLORIDE 0.9 % IV BOLUS (SEPSIS)
1000.0000 mL | Freq: Once | INTRAVENOUS | Status: AC
  Administered 2012-06-02: 1000 mL via INTRAVENOUS

## 2012-06-02 MED ORDER — HYDROMORPHONE HCL PF 1 MG/ML IJ SOLN
1.0000 mg | Freq: Once | INTRAMUSCULAR | Status: AC
  Administered 2012-06-02: 1 mg via INTRAVENOUS
  Filled 2012-06-02: qty 1

## 2012-06-02 MED ORDER — DIPHENHYDRAMINE HCL 25 MG PO CAPS
25.0000 mg | ORAL_CAPSULE | Freq: Once | ORAL | Status: AC
  Administered 2012-06-02: 25 mg via ORAL
  Filled 2012-06-02: qty 1

## 2012-06-02 MED ORDER — ONDANSETRON HCL 4 MG PO TABS: 4.0000 mg | ORAL_TABLET | Freq: Four times a day (QID) | ORAL | Status: DC

## 2012-06-02 MED ORDER — SODIUM CHLORIDE 0.9 % IV BOLUS (SEPSIS): 1000.0000 mL | Freq: Once | INTRAVENOUS | Status: DC

## 2012-06-02 MED ORDER — METOCLOPRAMIDE HCL 5 MG/ML IJ SOLN
10.0000 mg | Freq: Once | INTRAMUSCULAR | Status: AC
  Administered 2012-06-02: 10 mg via INTRAVENOUS
  Filled 2012-06-02: qty 2

## 2012-06-02 MED ORDER — ONDANSETRON 4 MG PO TBDP: ORAL_TABLET | ORAL | Status: DC

## 2012-06-02 NOTE — ED Notes (Signed)
Pt requesting to take her night-time medication of omeprazole, prograf and furosemide.  Dr. Ferdinand Lango aware.

## 2012-06-02 NOTE — ED Notes (Signed)
Dr. Miller at the bedside.  

## 2012-06-02 NOTE — ED Notes (Signed)
Dr. Ferdinand Lango at the bedside.

## 2012-06-02 NOTE — ED Notes (Signed)
Results of lactic acid shown to primary nurse [facility]

## 2012-06-02 NOTE — ED Notes (Signed)
Pt reports generalized itching x 1 hour.

## 2012-06-02 NOTE — ED Notes (Addendum)
Pt's friend states she started having abd pain two days ago and pain over her right kidney. She had her right kidney transplant in 2005. Also states she had been running a low grade fever. Pt states she is SOB but sats are at 100 % on RA.

## 2012-06-02 NOTE — ED Provider Notes (Signed)
47 year old female history of renal transplant, also has history of frequent visits to the emergency department for abdominal pain, nausea vomiting and back pain. She does have a history of recent admission to the hospital for pyelonephritis and states that over the last couple of days she's had gradual onset of nausea vomiting epigastric pain, decreased urinary output and feels like her mouth is dry. On exam she has dry mucous membranes, abdomen with epigastric tenderness but no peritoneal signs, normal heart and lung sounds without murmurs rubs or gallops rales wheezing or rhonchi.  To evaluate for infectious etiology, she has been labeled with gastroparesis but is nondiabetic, will be further evaluation with laboratory workup, fluid hydration, check renal function and urinalysis, rectal temperature.  I saw and evaluated the patient, reviewed the resident's note and I agree with the findings and plan.   Johnna Acosta, MD 06/02/12 [id number]

## 2012-06-02 NOTE — ED Provider Notes (Signed)
History     CSN: [id number]  Arrival date & time 06/02/12  [id number]   First MD Initiated Contact with Patient 06/02/12 [phone]      Chief Complaint  Patient presents with  . Abdominal Pain  . Back Pain    (Consider location/radiation/quality/duration/timing/severity/associated sxs/prior treatment) Patient is a 47 y.o. female presenting with abdominal pain and back pain.  Abdominal Pain Pain location:  Epigastric Pain quality: aching and cramping   Pain quality: not bloating, not burning and not dull   Pain radiates to:  Does not radiate Pain severity:  Moderate Onset quality:  Gradual Duration:  1 day Timing:  Constant Progression:  Waxing and waning Chronicity:  Recurrent Context comment:  History of "gastroparesis;" no previous gastric emptying studies; history of multiple visits to the ED for similar symptoms Relieved by:  None tried Worsened by:  Nothing tried Ineffective treatments:  None tried Associated symptoms: nausea and vomiting   Associated symptoms: no chest pain, no chills, no constipation, no diarrhea, no dysuria, no fever and no shortness of breath   Risk factors comment:  On immunosuppresants for history of renal transplant Back Pain Associated symptoms: abdominal pain (epigastric)   Associated symptoms: no chest pain, no dysuria and no fever     Past Medical History  Diagnosis Date  . Gastroparesis   . Gout   . Hypotension   . Herpes genitalia 1994  . E coli bacteremia 06/18/2011  . Bacteremia due to Gram-negative bacteria 05/23/2011  . History of blood transfusion     "more than a few times" (12/11/2011)  . History of stomach ulcers   . Stroke      left basal ganglia lacunar infarct; Right frontal lobe lacunar infarct.  . Stroke ~ 1999; 2001    "briefly lost my vision; lost my right eye" (12/11/2011)  . Chronic lower back pain   . Depression   . Osteopenia   . Headache     "not often anymore" (12/11/2011)  . Complication of anesthesia     "woke up  during OR; I have an extremely high tolerance" (12/11/2011)  . Dysrhythmia     "tachycardia" (12/11/2011)  . ESRD (end stage renal disease) 06/12/2011  . Iron deficiency anemia   . Seizures 1994    "post transplant; only have had that one" (12/11/2011)  . GERD (gastroesophageal reflux disease)     Past Surgical History  Procedure Laterality Date  . Kidney transplant  1994; 1999; 2005    "right"  . Appendectomy  ~ 2004  . 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 Total nephrectomy  1988?; 1994; 2005  . Multiple tooth extractions    . Enucleation  2001    "right"  . Esophagogastroduodenoscopy (egd) with propofol N/A 04/21/2012    Procedure: ESOPHAGOGASTRODUODENOSCOPY (EGD) WITH PROPOFOL;  Surgeon: Milus Banister, MD;  Location: [facility];  Service: Endoscopy;  Laterality: N/A;    Family History  Problem Relation Age of Onset  . Hypertension Maternal Grandmother     History  Substance Use Topics  . Smoking status: Former Smoker -- 0.50 packs/day for 28 years    Types: Cigarettes    Quit date: 03/05/2011  . Smokeless tobacco: Never Used  . Alcohol Use: Yes     Comment: uses daily    OB History   Grav Para Term Preterm Abortions TAB SAB Ect Mult Living  Review of Systems  Constitutional: Negative for fever, chills, activity change and appetite change.  HENT: Negative for neck pain and neck stiffness.   Respiratory: Negative for chest tightness, shortness of breath and wheezing.   Cardiovascular: Negative for chest pain and palpitations.  Gastrointestinal: Positive for nausea, vomiting and abdominal pain (epigastric). Negative for diarrhea, constipation and abdominal distention.  Genitourinary: Negative for dysuria, decreased urine volume and difficulty urinating.  Musculoskeletal: Positive for back pain.  Skin: Negative for rash and wound.  Neurological: Negative for syncope and  light-headedness.  Psychiatric/Behavioral: Negative for confusion and agitation.  All other systems reviewed and are negative.    Allergies  Levofloxacin  Home Medications   Current Outpatient Rx  Name  Route  Sig  Dispense  Refill  . ALPRAZolam (XANAX) 0.25 MG tablet   Oral   Take 0.25 mg by mouth 2 (two) times daily as needed. For anxiety         . aspirin EC 81 MG tablet   Oral   Take 81 mg by mouth daily.         [name] azaTHIOprine (IMURAN) 50 MG tablet   Oral   Take 100 mg by mouth daily before breakfast.          . calcitRIOL (ROCALTROL) 0.25 MCG capsule   Oral   Take 0.25 mcg by mouth daily before breakfast.          . colchicine 0.6 MG tablet   Oral   Take 0.6 mg by mouth daily as needed (For gout.).         . Darbepoetin Alfa-Polysorbate (ARANESP, ALB FREE, SURECLICK IJ)   Injection   Inject as directed.         . furosemide (LASIX) 40 MG tablet   Oral   Take 40 mg by mouth every evening.          [name] Hydrocodone-Acetaminophen 7.5-300 MG TABS   Oral   Take 1-2 tablets by mouth every 6 (six) hours as needed (as needed for Pain).   20 each   0   . metoCLOPramide (REGLAN) 10 MG tablet   Oral   Take 10 mg by mouth 2 (two) times daily.         . metoCLOPramide (REGLAN) 5 MG tablet      TAKE 1 TABLET (5 MG TOTAL) BY MOUTH AT BEDTIME AND MAY REPEAT DOSE ONE TIME IF NEEDED.   60 tablet   5   . midodrine (PROAMATINE) 10 MG tablet   Oral   Take 10 mg by mouth 2 (two) times daily.         . Multiple Vitamin (MULITIVITAMIN WITH MINERALS) TABS   Oral   Take 1 tablet by mouth daily.         [name] omeprazole (PRILOSEC) 40 MG capsule   Oral   Take 40 mg by mouth 2 (two) times daily.          . ondansetron (ZOFRAN) 4 MG tablet   Oral   Take 4 mg by mouth every 8 (eight) hours as needed for nausea.         . Oxycodone HCl 10 MG TABS   Oral   Take 10 mg by mouth 4 (four) times daily as needed. For pain         .  oxyCODONE-acetaminophen (PERCOCET) 10-650 MG per tablet   Oral   Take 1 tablet by mouth every 6 (six) hours as needed. For pain.         [name]  predniSONE (DELTASONE) 5 MG tablet   Oral   Take 5 mg by mouth daily.         . promethazine (PHENERGAN) 25 MG tablet   Oral   Take 25 mg by mouth every 6 (six) hours as needed. For nausea          . tacrolimus (PROGRAF) 1 MG capsule   Oral   Take 3 mg by mouth 2 (two) times daily.            BP 105/77  Pulse 80  Temp(Src) 97.9 F (36.6 C) (Oral)  Resp 22  SpO2 100%  Physical Exam  Nursing note and vitals reviewed. Constitutional: She is oriented to person, place, and time. She appears well-developed and well-nourished.  HENT:  Head: Normocephalic and atraumatic.  Right Ear: External ear normal.  Left Ear: External ear normal.  Nose: Nose normal.  Mouth/Throat: No oropharyngeal exudate.  Eyes:  Right eye without vision; closed at all times  Neck: Normal range of motion. Neck supple.  Cardiovascular: Normal rate, regular rhythm, normal heart sounds and intact distal pulses.  Exam reveals no gallop and no friction rub.   No murmur heard. Pulmonary/Chest: Effort normal and breath sounds normal. No respiratory distress. She has no wheezes. She has no rales. She exhibits no tenderness.  Abdominal: Soft. Bowel sounds are normal. She exhibits no distension and no mass. There is tenderness (moderate epigastric; no tenderness over transplanted kidney). There is no rebound and no guarding.  Healed scars overlying abdomen from previous surgeries  Musculoskeletal: Normal range of motion. She exhibits no edema and no tenderness.  Neurological: She is alert and oriented to person, place, and time.  Skin: Skin is warm and dry.  Psychiatric: She has a normal mood and affect. Her behavior is normal. Judgment and thought content normal.    ED Course  Procedures (including critical care time)  Labs Reviewed  URINALYSIS, ROUTINE W REFLEX  MICROSCOPIC - Abnormal; Notable for the following:    Bilirubin Urine SMALL (*)    Ketones, ur 15 (*)    Protein, ur 30 (*)    All other components within normal limits  CBC WITH DIFFERENTIAL - Abnormal; Notable for the following:    RBC 3.30 (*)    Hemoglobin 10.0 (*)    HCT 28.6 (*)    RDW 18.1 (*)    All other components within normal limits  COMPREHENSIVE METABOLIC PANEL - Abnormal; Notable for the following:    BUN 34 (*)    Creatinine, Ser 2.69 (*)    Calcium 10.6 (*)    GFR calc non Af Amer 20 (*)    GFR calc Af Amer 24 (*)    All other components within normal limits  LIPASE, BLOOD  URINE MICROSCOPIC-ADD ON  CG4 I-STAT (LACTIC ACID)  POCT PREGNANCY, URINE   No results found.   1. Emesis   2. Chronic renal insufficiency      MDM  47 yo F w/hx of renal transplant (on Prograf and Imuran) presents for her typical gastroparesis symptoms (nausea/vomiting, epigastric pain) of 24 hrs. No associated fever/chills or chest pain. Afebrile, vital signs stable. Tenderness overlying epigastrum but no peritoneal signs. No flank tenderness or pain/tenderness overlying transplanted kidney. Will check urinalysis, electrolytes, lactic acid, lipase and administer IVF, analgesia, and anti-emetics.  Patient tolerating PO intake (applesauce, ginger ale, and water) in ED. Labs reveal improved creatinine from baseline and no evidence of UTI. Labs also not consistent with dehydration. Pain  significantly improved with symptomatic treatment. I counseled her at length on her anxiety and how this seems to impact her vomiting. Denies suicidal/homicidal ideation. Patient given return precautions, including worsening of signs or symptoms. Patient instructed to follow-up with primary care physician.         Marco Collie, MD 06/02/12 [phone]

## 2012-06-02 NOTE — ED Notes (Addendum)
Pt reports nausea is resolved, but reports continued epigastric pain that is now generalized.  Pt reports she has been having intermittent "muscle cramps" (had episode during assessment) that last approximately 10-15 seconds.  PIV has NS infusing, no IV-related problems noted.  Pt sipping ginger ale and tolerated well so far.

## 2012-06-03 ENCOUNTER — Encounter (HOSPITAL_COMMUNITY)
Admission: RE | Admit: 2012-06-03 | Discharge: 2012-06-03 | Disposition: A | Discharge status: Home / Self Care | Payer: Medicaid Other | Source: Ambulatory Visit | Attending: Nephrology | Admitting: Nephrology

## 2012-06-03 ENCOUNTER — Ambulatory Visit: Payer: Medicaid Other | Admitting: Gastroenterology

## 2012-06-03 ENCOUNTER — Encounter (HOSPITAL_COMMUNITY): Payer: Medicaid Other

## 2012-06-03 DIAGNOSIS — N183 Chronic kidney disease, stage 3 unspecified: Secondary | ICD-10-CM | POA: Insufficient documentation

## 2012-06-03 DIAGNOSIS — D638 Anemia in other chronic diseases classified elsewhere: Secondary | ICD-10-CM | POA: Insufficient documentation

## 2012-06-03 LAB — RENAL FUNCTION PANEL
Albumin: 4 g/dL (ref 3.5–5.2)
BUN: 34 mg/dL — ABNORMAL HIGH (ref 6–23)
CO2: 23 mEq/L (ref 19–32)
Calcium: 9.7 mg/dL (ref 8.4–10.5)
Chloride: 101 mEq/L (ref 96–112)
Creatinine, Ser: 3.22 mg/dL — ABNORMAL HIGH (ref 0.50–1.10)
GFR calc Af Amer: 19 mL/min — ABNORMAL LOW (ref >90)
GFR calc non Af Amer: 16 mL/min — ABNORMAL LOW (ref >90)
Glucose, Bld: 88 mg/dL (ref 70–99)
Phosphorus: 3.5 mg/dL (ref 2.3–4.6)
Potassium: 4.6 mEq/L (ref 3.5–5.1)
Sodium: 135 mEq/L (ref 135–145)

## 2012-06-03 LAB — POCT HEMOGLOBIN-HEMACUE: Hemoglobin: 9.5 g/dL — ABNORMAL LOW (ref 12.0–15.0)

## 2012-06-03 MED ORDER — DARBEPOETIN ALFA-POLYSORBATE 60 MCG/0.3ML IJ SOLN
INTRAMUSCULAR | Status: AC
  Filled 2012-06-03: qty 0.3

## 2012-06-03 MED ORDER — DARBEPOETIN ALFA-POLYSORBATE 60 MCG/0.3ML IJ SOLN
60.0000 ug | INTRAMUSCULAR | Status: DC
  Administered 2012-06-03: 60 ug via SUBCUTANEOUS

## 2012-06-03 NOTE — ED Provider Notes (Signed)
I saw and evaluated the patient, reviewed the resident's note and I agree with the findings and plan.  Please see my separate note  Johnna Acosta, MD 06/03/12 [phone]

## 2012-06-13 ENCOUNTER — Telehealth: Payer: Self-pay | Admitting: Gastroenterology

## 2012-06-13 NOTE — Telephone Encounter (Signed)
Message copied by Oliva Bustard on Mon Jun 13, 2012  9:09 AM ------      Message from: Barron Alvine      Created: Fri Jun 03, 2012  2:37 PM       Bill pt        ------

## 2012-06-22 ENCOUNTER — Encounter (HOSPITAL_COMMUNITY)
Admission: RE | Admit: 2012-06-22 | Discharge: 2012-06-22 | Disposition: A | Discharge status: Home / Self Care | Payer: Medicaid Other | Source: Ambulatory Visit | Attending: Nephrology | Admitting: Nephrology

## 2012-06-22 ENCOUNTER — Telehealth: Payer: Self-pay | Admitting: Gastroenterology

## 2012-06-22 LAB — POCT HEMOGLOBIN-HEMACUE: Hemoglobin: 9.4 g/dL — ABNORMAL LOW (ref 12.0–15.0)

## 2012-06-22 MED ORDER — DARBEPOETIN ALFA-POLYSORBATE 60 MCG/0.3ML IJ SOLN
INTRAMUSCULAR | Status: AC
  Filled 2012-06-22: qty 0.3

## 2012-06-22 MED ORDER — DARBEPOETIN ALFA-POLYSORBATE 60 MCG/0.3ML IJ SOLN
60.0000 ug | INTRAMUSCULAR | Status: DC
  Administered 2012-06-22: 60 ug via SUBCUTANEOUS

## 2012-06-22 NOTE — Telephone Encounter (Signed)
GES and ENDO have been faxed to the number given Danton Clap has been notified

## 2012-07-08 ENCOUNTER — Encounter (HOSPITAL_COMMUNITY)
Admission: RE | Admit: 2012-07-08 | Discharge: 2012-07-08 | Disposition: A | Discharge status: Home / Self Care | Payer: Medicaid Other | Source: Ambulatory Visit | Attending: Nephrology | Admitting: Nephrology

## 2012-07-08 DIAGNOSIS — D638 Anemia in other chronic diseases classified elsewhere: Secondary | ICD-10-CM | POA: Insufficient documentation

## 2012-07-08 DIAGNOSIS — N183 Chronic kidney disease, stage 3 unspecified: Secondary | ICD-10-CM | POA: Insufficient documentation

## 2012-07-08 LAB — IRON AND TIBC
Iron: 63 ug/dL (ref 42–135)
Saturation Ratios: 34 % (ref 20–55)
TIBC: 188 ug/dL — ABNORMAL LOW (ref 250–470)
UIBC: 125 ug/dL (ref 125–400)

## 2012-07-08 LAB — POCT HEMOGLOBIN-HEMACUE: Hemoglobin: 10.6 g/dL — ABNORMAL LOW (ref 12.0–15.0)

## 2012-07-08 MED ORDER — DARBEPOETIN ALFA-POLYSORBATE 60 MCG/0.3ML IJ SOLN
INTRAMUSCULAR | Status: AC
  Filled 2012-07-08: qty 0.3

## 2012-07-08 MED ORDER — DARBEPOETIN ALFA-POLYSORBATE 60 MCG/0.3ML IJ SOLN
60.0000 ug | INTRAMUSCULAR | Status: DC
  Administered 2012-07-08: 60 ug via SUBCUTANEOUS

## 2012-07-09 LAB — FERRITIN: Ferritin: 1535 ng/mL — ABNORMAL HIGH (ref 10–291)

## 2012-07-17 ENCOUNTER — Emergency Department (HOSPITAL_COMMUNITY)
Admission: EM | Admit: 2012-07-17 | Discharge: 2012-07-17 | Disposition: A | Discharge status: Home / Self Care | Payer: Medicaid Other | Attending: Emergency Medicine | Admitting: Emergency Medicine

## 2012-07-17 ENCOUNTER — Encounter (HOSPITAL_COMMUNITY): Payer: Self-pay | Admitting: *Deleted

## 2012-07-17 DIAGNOSIS — Z8673 Personal history of transient ischemic attack (TIA), and cerebral infarction without residual deficits: Secondary | ICD-10-CM | POA: Insufficient documentation

## 2012-07-17 DIAGNOSIS — M899 Disorder of bone, unspecified: Secondary | ICD-10-CM | POA: Insufficient documentation

## 2012-07-17 DIAGNOSIS — Z3202 Encounter for pregnancy test, result negative: Secondary | ICD-10-CM | POA: Insufficient documentation

## 2012-07-17 DIAGNOSIS — M545 Low back pain, unspecified: Secondary | ICD-10-CM | POA: Insufficient documentation

## 2012-07-17 DIAGNOSIS — Z8639 Personal history of other endocrine, nutritional and metabolic disease: Secondary | ICD-10-CM | POA: Insufficient documentation

## 2012-07-17 DIAGNOSIS — Z8679 Personal history of other diseases of the circulatory system: Secondary | ICD-10-CM | POA: Insufficient documentation

## 2012-07-17 DIAGNOSIS — Z79899 Other long term (current) drug therapy: Secondary | ICD-10-CM | POA: Insufficient documentation

## 2012-07-17 DIAGNOSIS — F329 Major depressive disorder, single episode, unspecified: Secondary | ICD-10-CM | POA: Insufficient documentation

## 2012-07-17 DIAGNOSIS — Z940 Kidney transplant status: Secondary | ICD-10-CM | POA: Insufficient documentation

## 2012-07-17 DIAGNOSIS — Z881 Allergy status to other antibiotic agents status: Secondary | ICD-10-CM | POA: Insufficient documentation

## 2012-07-17 DIAGNOSIS — F3289 Other specified depressive episodes: Secondary | ICD-10-CM | POA: Insufficient documentation

## 2012-07-17 DIAGNOSIS — R220 Localized swelling, mass and lump, head: Secondary | ICD-10-CM | POA: Insufficient documentation

## 2012-07-17 DIAGNOSIS — D509 Iron deficiency anemia, unspecified: Secondary | ICD-10-CM | POA: Insufficient documentation

## 2012-07-17 DIAGNOSIS — K3184 Gastroparesis: Secondary | ICD-10-CM

## 2012-07-17 DIAGNOSIS — Z862 Personal history of diseases of the blood and blood-forming organs and certain disorders involving the immune mechanism: Secondary | ICD-10-CM | POA: Insufficient documentation

## 2012-07-17 DIAGNOSIS — Z8719 Personal history of other diseases of the digestive system: Secondary | ICD-10-CM | POA: Insufficient documentation

## 2012-07-17 DIAGNOSIS — Z8619 Personal history of other infectious and parasitic diseases: Secondary | ICD-10-CM | POA: Insufficient documentation

## 2012-07-17 DIAGNOSIS — Z7982 Long term (current) use of aspirin: Secondary | ICD-10-CM | POA: Insufficient documentation

## 2012-07-17 DIAGNOSIS — G8929 Other chronic pain: Secondary | ICD-10-CM | POA: Insufficient documentation

## 2012-07-17 DIAGNOSIS — K219 Gastro-esophageal reflux disease without esophagitis: Secondary | ICD-10-CM | POA: Insufficient documentation

## 2012-07-17 DIAGNOSIS — I499 Cardiac arrhythmia, unspecified: Secondary | ICD-10-CM | POA: Insufficient documentation

## 2012-07-17 DIAGNOSIS — Z87891 Personal history of nicotine dependence: Secondary | ICD-10-CM | POA: Insufficient documentation

## 2012-07-17 DIAGNOSIS — E860 Dehydration: Secondary | ICD-10-CM

## 2012-07-17 DIAGNOSIS — Z9189 Other specified personal risk factors, not elsewhere classified: Secondary | ICD-10-CM | POA: Insufficient documentation

## 2012-07-17 DIAGNOSIS — R221 Localized swelling, mass and lump, neck: Secondary | ICD-10-CM | POA: Insufficient documentation

## 2012-07-17 DIAGNOSIS — R1110 Vomiting, unspecified: Secondary | ICD-10-CM | POA: Insufficient documentation

## 2012-07-17 DIAGNOSIS — R0602 Shortness of breath: Secondary | ICD-10-CM | POA: Insufficient documentation

## 2012-07-17 LAB — URINALYSIS, ROUTINE W REFLEX MICROSCOPIC
Bilirubin Urine: NEGATIVE
Glucose, UA: NEGATIVE mg/dL
Hgb urine dipstick: NEGATIVE
Ketones, ur: NEGATIVE mg/dL
Leukocytes, UA: NEGATIVE
Nitrite: NEGATIVE
Protein, ur: NEGATIVE mg/dL
Specific Gravity, Urine: 1.009 (ref 1.005–1.030)
Urobilinogen, UA: 0.2 mg/dL (ref 0.0–1.0)
pH: 6 (ref 5.0–8.0)

## 2012-07-17 LAB — CBC WITH DIFFERENTIAL/PLATELET
Basophils Absolute: 0 10*3/uL (ref 0.0–0.1)
Basophils Relative: 0 % (ref 0–1)
Eosinophils Absolute: 0.1 10*3/uL (ref 0.0–0.7)
Eosinophils Relative: 1 % (ref 0–5)
HCT: 27.4 % — ABNORMAL LOW (ref 36.0–46.0)
Hemoglobin: 9.4 g/dL — ABNORMAL LOW (ref 12.0–15.0)
Lymphocytes Relative: 16 % (ref 12–46)
Lymphs Abs: 0.8 10*3/uL (ref 0.7–4.0)
MCH: 30.8 pg (ref 26.0–34.0)
MCHC: 34.3 g/dL (ref 30.0–36.0)
MCV: 89.8 fL (ref 78.0–100.0)
Monocytes Absolute: 0.4 10*3/uL (ref 0.1–1.0)
Monocytes Relative: 8 % (ref 3–12)
Neutro Abs: 3.6 10*3/uL (ref 1.7–7.7)
Neutrophils Relative %: 75 % (ref 43–77)
Platelets: 203 10*3/uL (ref 150–400)
RBC: 3.05 MIL/uL — ABNORMAL LOW (ref 3.87–5.11)
RDW: 19 % — ABNORMAL HIGH (ref 11.5–15.5)
WBC: 4.8 10*3/uL (ref 4.0–10.5)

## 2012-07-17 LAB — COMPREHENSIVE METABOLIC PANEL
ALT: 9 U/L (ref 0–35)
AST: 21 U/L (ref 0–37)
Albumin: 4.6 g/dL (ref 3.5–5.2)
Alkaline Phosphatase: 66 U/L (ref 39–117)
BUN: 21 mg/dL (ref 6–23)
CO2: 21 mEq/L (ref 19–32)
Calcium: 10.3 mg/dL (ref 8.4–10.5)
Chloride: 103 mEq/L (ref 96–112)
Creatinine, Ser: 1.87 mg/dL — ABNORMAL HIGH (ref 0.50–1.10)
GFR calc Af Amer: 37 mL/min — ABNORMAL LOW (ref >90)
GFR calc non Af Amer: 32 mL/min — ABNORMAL LOW (ref >90)
Glucose, Bld: 82 mg/dL (ref 70–99)
Potassium: 3.5 mEq/L (ref 3.5–5.1)
Sodium: 137 mEq/L (ref 135–145)
Total Bilirubin: 0.5 mg/dL (ref 0.3–1.2)
Total Protein: 7.3 g/dL (ref 6.0–8.3)

## 2012-07-17 LAB — PREGNANCY, URINE: Preg Test, Ur: NEGATIVE

## 2012-07-17 LAB — LIPASE, BLOOD: Lipase: 33 U/L (ref 11–59)

## 2012-07-17 MED ORDER — ONDANSETRON 4 MG PO TBDP
ORAL_TABLET | ORAL | Status: AC
  Filled 2012-07-17: qty 2

## 2012-07-17 MED ORDER — METOCLOPRAMIDE HCL 5 MG/ML IJ SOLN
10.0000 mg | Freq: Once | INTRAMUSCULAR | Status: AC
  Administered 2012-07-17: 10 mg via INTRAVENOUS
  Filled 2012-07-17: qty 2

## 2012-07-17 MED ORDER — SODIUM CHLORIDE 0.9 % IV BOLUS (SEPSIS)
1000.0000 mL | Freq: Once | INTRAVENOUS | Status: AC
  Administered 2012-07-17: 1000 mL via INTRAVENOUS

## 2012-07-17 MED ORDER — HYDROMORPHONE HCL PF 1 MG/ML IJ SOLN
1.0000 mg | Freq: Once | INTRAMUSCULAR | Status: AC
  Administered 2012-07-17: 1 mg via INTRAVENOUS
  Filled 2012-07-17: qty 1

## 2012-07-17 MED ORDER — ONDANSETRON 4 MG PO TBDP
8.0000 mg | ORAL_TABLET | Freq: Once | ORAL | Status: AC
  Administered 2012-07-17: 8 mg via ORAL

## 2012-07-17 NOTE — ED Notes (Signed)
Pt was recently released from [facility] for gastroparesis. Pt states she was feeling better but today her stomach began to hurt worse. Pt rates pain 10/10. Pt squirming in bed, crying, and in fetal position.

## 2012-07-17 NOTE — ED Provider Notes (Signed)
History     CSN: [id number]  Arrival date & time 07/17/12  1502   First MD Initiated Contact with Patient 07/17/12 1626      Chief Complaint  Patient presents with  . Abdominal Pain    (Consider location/radiation/quality/duration/timing/severity/associated sxs/prior treatment) The history is provided by the patient.  Tina Watkins is a 47 y.o. female history of gastroparesis, gout, kidney transplant here presenting with gastroparesis. Was just admitted and discharged from [facility] this morning for gastroparesis. She left the hospital and subsequently was vomiting and unable to tolerate anything by mouth. She then comes here for hydration and evaluation. She says she gets short of breath and has diffuse abdominal pain but had chronic pain or shortness of breath when a radiate she gets this exacerbation. Denies fevers or chills.    Past Medical History  Diagnosis Date  . Gastroparesis   . Gout   . Hypotension   . Herpes genitalia 1994  . E coli bacteremia 06/18/2011  . Bacteremia due to Gram-negative bacteria 05/23/2011  . History of blood transfusion     "more than a few times" (12/11/2011)  . History of stomach ulcers   . Stroke      left basal ganglia lacunar infarct; Right frontal lobe lacunar infarct.  . Stroke ~ 1999; 2001    "briefly lost my vision; lost my right eye" (12/11/2011)  . Chronic lower back pain   . Depression   . Osteopenia   . Headache(784.0)     "not often anymore" (12/11/2011)  . Complication of anesthesia     "woke up during OR; I have an extremely high tolerance" (12/11/2011)  . Dysrhythmia     "tachycardia" (12/11/2011)  . ESRD (end stage renal disease) 06/12/2011  . Iron deficiency anemia   . Seizures 1994    "post transplant; only have had that one" (12/11/2011)  . GERD (gastroesophageal reflux disease)     Past Surgical History  Procedure Laterality Date  . Kidney transplant  1994; 1999; 2005    "right"  . Appendectomy  ~ 2004  .  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 Total nephrectomy  1988?; 1994; 2005  . Multiple tooth extractions    . Enucleation  2001    "right"  . Esophagogastroduodenoscopy (egd) with propofol N/A 04/21/2012    Procedure: ESOPHAGOGASTRODUODENOSCOPY (EGD) WITH PROPOFOL;  Surgeon: Milus Banister, MD;  Location: [facility];  Service: Endoscopy;  Laterality: N/A;    Family History  Problem Relation Age of Onset  . Hypertension Maternal Grandmother     History  Substance Use Topics  . Smoking status: Former Smoker -- 0.50 packs/day for 28 years    Types: Cigarettes    Quit date: 03/05/2011  . Smokeless tobacco: Never Used  . Alcohol Use: Yes     Comment: uses daily    OB History   Grav Para Term Preterm Abortions TAB SAB Ect Mult Living                  Review of Systems  Respiratory: Positive for shortness of breath.   Gastrointestinal: Positive for vomiting and abdominal pain.  All other systems reviewed and are negative.    Allergies  Levofloxacin  Home Medications   Current Outpatient Rx  Name  Route  Sig  Dispense  Refill  . aspirin EC 81 MG tablet   Oral  Take 81 mg by mouth daily.         [name] azaTHIOprine (IMURAN) 50 MG tablet   Oral   Take 100 mg by mouth daily before breakfast.          . calcitRIOL (ROCALTROL) 0.25 MCG capsule   Oral   Take 0.25 mcg by mouth daily before breakfast.          . colchicine 0.6 MG tablet   Oral   Take 0.6 mg by mouth daily as needed (For gout.).         . Darbepoetin Alfa-Polysorbate (ARANESP, ALB FREE, SURECLICK IJ)   Injection   Inject as directed.         . furosemide (LASIX) 40 MG tablet   Oral   Take 40 mg by mouth every evening.          [name] Hydrocodone-Acetaminophen 7.5-300 MG TABS   Oral   Take 1-2 tablets by mouth every 6 (six) hours as needed (as needed for Pain).   20 each   0   . metoCLOPramide (REGLAN) 5 MG tablet   Oral    Take 5 mg by mouth 4 (four) times daily.         . Multiple Vitamin (MULITIVITAMIN WITH MINERALS) TABS   Oral   Take 1 tablet by mouth daily.         [name] omeprazole (PRILOSEC) 40 MG capsule   Oral   Take 40 mg by mouth 2 (two) times daily.          . ondansetron (ZOFRAN ODT) 4 MG disintegrating tablet      4mg  ODT q4 hours prn nausea/vomit   8 tablet   0   . predniSONE (DELTASONE) 5 MG tablet   Oral   Take 5 mg by mouth daily.         . tacrolimus (PROGRAF) 1 MG capsule   Oral   Take 3 mg by mouth 2 (two) times daily.          . promethazine (PHENERGAN) 25 MG tablet   Oral   Take 25 mg by mouth every 6 (six) hours as needed for nausea.            BP 98/58  Pulse 57  Temp(Src) 98.8 F (37.1 C) (Oral)  Resp 16  SpO2 100%  Physical Exam  Nursing note and vitals reviewed. Constitutional: She is oriented to person, place, and time.  Chronically ill, tired, dehydrated   HENT:  Head: Normocephalic.  MM slightly dry. Face diffusely swollen (per patient from IV hydration)  Eyes: Conjunctivae are normal. Pupils are equal, round, and reactive to light.  Neck: Normal range of motion. Neck supple.  Cardiovascular: Normal rate, regular rhythm and normal heart sounds.   Pulmonary/Chest: Effort normal and breath sounds normal. No respiratory distress. She has no wheezes. She has no rales.  Abdominal: Soft.  Mild diffuse tenderness, no rebound   Musculoskeletal: Normal range of motion.  Neurological: She is alert and oriented to person, place, and time.  Skin: Skin is warm and dry.  Psychiatric:  Tearful     ED Course  Procedures (including critical care time)  Labs Reviewed  CBC WITH DIFFERENTIAL - Abnormal; Notable for the following:    RBC 3.05 (*)    Hemoglobin 9.4 (*)    HCT 27.4 (*)    RDW 19.0 (*)    All other components within normal limits  COMPREHENSIVE METABOLIC PANEL - Abnormal; Notable for the  following:    Creatinine, Ser 1.87 (*)    GFR  calc non Af Amer 32 (*)    GFR calc Af Amer 37 (*)    All other components within normal limits  LIPASE, BLOOD  URINALYSIS, ROUTINE W REFLEX MICROSCOPIC  PREGNANCY, URINE   No results found.   No diagnosis found.    MDM  RAMONDA Watkins is a 47 y.o. female here with worsening gastroparesis and was d/c from hospital this AM. Will give reglan and hydrate and reassess.   6:38 PM Labs at baseline. Cr improved from prior. She tolerated PO and wants to go home. She has meds at home.         Wandra Arthurs, MD 07/17/12 [phone]

## 2012-07-17 NOTE — ED Notes (Addendum)
Pt in stating she is having a gastroparesis flare, states she is having severe abd pain causing n/v since yesterday, states these symptoms are typical of her flares, states the pain is making her feel short of breath. Pt hyperventilating in triage. Pt last seen for same earlier in the month. Pt states she isn't able to keep any medications down, states that she is a kidney transplant patient and that she has been unable to keep her transplant medications down.

## 2012-07-17 NOTE — ED Notes (Signed)
Pt able to keep water down and states she feels well enough to go home.

## 2012-07-17 NOTE — ED Notes (Signed)
Discharge instructions reviewed with pt. Pt verbalized understanding.   

## 2012-07-22 ENCOUNTER — Encounter (HOSPITAL_COMMUNITY)
Admission: RE | Admit: 2012-07-22 | Discharge: 2012-07-22 | Disposition: A | Discharge status: Home / Self Care | Payer: Medicaid Other | Source: Ambulatory Visit | Attending: Nephrology | Admitting: Nephrology

## 2012-07-22 LAB — POCT HEMOGLOBIN-HEMACUE: Hemoglobin: 8.9 g/dL — ABNORMAL LOW (ref 12.0–15.0)

## 2012-07-22 LAB — RENAL FUNCTION PANEL
Albumin: 3.8 g/dL (ref 3.5–5.2)
BUN: 28 mg/dL — ABNORMAL HIGH (ref 6–23)
CO2: 25 mEq/L (ref 19–32)
Calcium: 9.7 mg/dL (ref 8.4–10.5)
Chloride: 103 mEq/L (ref 96–112)
Creatinine, Ser: 2.88 mg/dL — ABNORMAL HIGH (ref 0.50–1.10)
GFR calc Af Amer: 22 mL/min — ABNORMAL LOW (ref >90)
GFR calc non Af Amer: 19 mL/min — ABNORMAL LOW (ref >90)
Glucose, Bld: 80 mg/dL (ref 70–99)
Phosphorus: 2.5 mg/dL (ref 2.3–4.6)
Potassium: 4 mEq/L (ref 3.5–5.1)
Sodium: 138 mEq/L (ref 135–145)

## 2012-07-22 MED ORDER — DARBEPOETIN ALFA-POLYSORBATE 60 MCG/0.3ML IJ SOLN
60.0000 ug | INTRAMUSCULAR | Status: DC
  Administered 2012-07-22: 60 ug via SUBCUTANEOUS

## 2012-07-22 MED ORDER — DARBEPOETIN ALFA-POLYSORBATE 60 MCG/0.3ML IJ SOLN
INTRAMUSCULAR | Status: AC
  Filled 2012-07-22: qty 0.3

## 2012-08-04 ENCOUNTER — Encounter (HOSPITAL_COMMUNITY)
Admission: RE | Admit: 2012-08-04 | Discharge: 2012-08-04 | Disposition: A | Discharge status: Home / Self Care | Payer: Medicaid Other | Source: Ambulatory Visit | Attending: Nephrology | Admitting: Nephrology

## 2012-08-04 DIAGNOSIS — D638 Anemia in other chronic diseases classified elsewhere: Secondary | ICD-10-CM | POA: Insufficient documentation

## 2012-08-04 DIAGNOSIS — N183 Chronic kidney disease, stage 3 unspecified: Secondary | ICD-10-CM | POA: Insufficient documentation

## 2012-08-04 LAB — POCT HEMOGLOBIN-HEMACUE: Hemoglobin: 9.7 g/dL — ABNORMAL LOW (ref 12.0–15.0)

## 2012-08-04 MED ORDER — DARBEPOETIN ALFA-POLYSORBATE 60 MCG/0.3ML IJ SOLN
INTRAMUSCULAR | Status: AC
  Administered 2012-08-04: 60 ug via SUBCUTANEOUS
  Filled 2012-08-04: qty 0.3

## 2012-08-04 MED ORDER — DARBEPOETIN ALFA-POLYSORBATE 60 MCG/0.3ML IJ SOLN: 60.0000 ug | INTRAMUSCULAR | Status: DC

## 2012-08-18 ENCOUNTER — Other Ambulatory Visit (HOSPITAL_COMMUNITY): Payer: Self-pay

## 2012-08-18 ENCOUNTER — Encounter (HOSPITAL_COMMUNITY): Payer: Medicaid Other

## 2012-08-19 ENCOUNTER — Encounter (HOSPITAL_COMMUNITY)
Admission: RE | Admit: 2012-08-19 | Discharge: 2012-08-19 | Disposition: A | Discharge status: Home / Self Care | Payer: Medicaid Other | Source: Ambulatory Visit | Attending: Nephrology | Admitting: Nephrology

## 2012-08-19 LAB — RENAL FUNCTION PANEL
Albumin: 4.3 g/dL (ref 3.5–5.2)
BUN: 34 mg/dL — ABNORMAL HIGH (ref 6–23)
CO2: 25 mEq/L (ref 19–32)
Calcium: 10.5 mg/dL (ref 8.4–10.5)
Chloride: 102 mEq/L (ref 96–112)
Creatinine, Ser: 2.69 mg/dL — ABNORMAL HIGH (ref 0.50–1.10)
GFR calc Af Amer: 24 mL/min — ABNORMAL LOW (ref >90)
GFR calc non Af Amer: 20 mL/min — ABNORMAL LOW (ref >90)
Glucose, Bld: 82 mg/dL (ref 70–99)
Phosphorus: 3.7 mg/dL (ref 2.3–4.6)
Potassium: 3.7 mEq/L (ref 3.5–5.1)
Sodium: 139 mEq/L (ref 135–145)

## 2012-08-19 LAB — POCT HEMOGLOBIN-HEMACUE: Hemoglobin: 10.1 g/dL — ABNORMAL LOW (ref 12.0–15.0)

## 2012-08-19 MED ORDER — EPOETIN ALFA 20000 UNIT/ML IJ SOLN: 20000.0000 [IU] | INTRAMUSCULAR | Status: DC

## 2012-08-19 MED ORDER — EPOETIN ALFA 20000 UNIT/ML IJ SOLN
INTRAMUSCULAR | Status: AC
  Administered 2012-08-19: 20000 [IU] via SUBCUTANEOUS
  Filled 2012-08-19: qty 1

## 2012-09-02 ENCOUNTER — Encounter (HOSPITAL_COMMUNITY)
Admission: RE | Admit: 2012-09-02 | Discharge: 2012-09-02 | Disposition: A | Discharge status: Home / Self Care | Payer: Medicaid Other | Source: Ambulatory Visit | Attending: Nephrology | Admitting: Nephrology

## 2012-09-02 DIAGNOSIS — D638 Anemia in other chronic diseases classified elsewhere: Secondary | ICD-10-CM | POA: Insufficient documentation

## 2012-09-02 DIAGNOSIS — N183 Chronic kidney disease, stage 3 unspecified: Secondary | ICD-10-CM | POA: Insufficient documentation

## 2012-09-02 LAB — IRON AND TIBC
Iron: 68 ug/dL (ref 42–135)
Saturation Ratios: 35 % (ref 20–55)
TIBC: 192 ug/dL — ABNORMAL LOW (ref 250–470)
UIBC: 124 ug/dL — ABNORMAL LOW (ref 125–400)

## 2012-09-02 LAB — POCT HEMOGLOBIN-HEMACUE: Hemoglobin: 9.8 g/dL — ABNORMAL LOW (ref 12.0–15.0)

## 2012-09-02 LAB — FERRITIN: Ferritin: 1270 ng/mL — ABNORMAL HIGH (ref 10–291)

## 2012-09-02 MED ORDER — EPOETIN ALFA 20000 UNIT/ML IJ SOLN: 20000.0000 [IU] | INTRAMUSCULAR | Status: DC

## 2012-09-02 MED ORDER — EPOETIN ALFA 20000 UNIT/ML IJ SOLN
INTRAMUSCULAR | Status: AC
  Administered 2012-09-02: 20000 [IU] via SUBCUTANEOUS
  Filled 2012-09-02: qty 1

## 2012-09-09 ENCOUNTER — Encounter (HOSPITAL_COMMUNITY): Payer: Self-pay | Admitting: Emergency Medicine

## 2012-09-09 ENCOUNTER — Emergency Department (HOSPITAL_COMMUNITY)
Admission: EM | Admit: 2012-09-09 | Discharge: 2012-09-09 | Disposition: A | Discharge status: Home / Self Care | Payer: Medicaid Other | Attending: Emergency Medicine | Admitting: Emergency Medicine

## 2012-09-09 DIAGNOSIS — G8929 Other chronic pain: Secondary | ICD-10-CM | POA: Insufficient documentation

## 2012-09-09 DIAGNOSIS — R197 Diarrhea, unspecified: Secondary | ICD-10-CM | POA: Insufficient documentation

## 2012-09-09 DIAGNOSIS — N186 End stage renal disease: Secondary | ICD-10-CM | POA: Insufficient documentation

## 2012-09-09 DIAGNOSIS — Z8659 Personal history of other mental and behavioral disorders: Secondary | ICD-10-CM | POA: Insufficient documentation

## 2012-09-09 DIAGNOSIS — Z79899 Other long term (current) drug therapy: Secondary | ICD-10-CM | POA: Insufficient documentation

## 2012-09-09 DIAGNOSIS — Z8673 Personal history of transient ischemic attack (TIA), and cerebral infarction without residual deficits: Secondary | ICD-10-CM | POA: Insufficient documentation

## 2012-09-09 DIAGNOSIS — K3184 Gastroparesis: Secondary | ICD-10-CM

## 2012-09-09 DIAGNOSIS — R1084 Generalized abdominal pain: Secondary | ICD-10-CM | POA: Insufficient documentation

## 2012-09-09 DIAGNOSIS — Z8619 Personal history of other infectious and parasitic diseases: Secondary | ICD-10-CM | POA: Insufficient documentation

## 2012-09-09 DIAGNOSIS — R112 Nausea with vomiting, unspecified: Secondary | ICD-10-CM | POA: Insufficient documentation

## 2012-09-09 DIAGNOSIS — Z862 Personal history of diseases of the blood and blood-forming organs and certain disorders involving the immune mechanism: Secondary | ICD-10-CM | POA: Insufficient documentation

## 2012-09-09 DIAGNOSIS — K219 Gastro-esophageal reflux disease without esophagitis: Secondary | ICD-10-CM | POA: Insufficient documentation

## 2012-09-09 DIAGNOSIS — Z7982 Long term (current) use of aspirin: Secondary | ICD-10-CM | POA: Insufficient documentation

## 2012-09-09 DIAGNOSIS — Z87891 Personal history of nicotine dependence: Secondary | ICD-10-CM | POA: Insufficient documentation

## 2012-09-09 DIAGNOSIS — IMO0002 Reserved for concepts with insufficient information to code with codable children: Secondary | ICD-10-CM | POA: Insufficient documentation

## 2012-09-09 DIAGNOSIS — Z8669 Personal history of other diseases of the nervous system and sense organs: Secondary | ICD-10-CM | POA: Insufficient documentation

## 2012-09-09 DIAGNOSIS — E860 Dehydration: Secondary | ICD-10-CM | POA: Insufficient documentation

## 2012-09-09 DIAGNOSIS — Z8679 Personal history of other diseases of the circulatory system: Secondary | ICD-10-CM | POA: Insufficient documentation

## 2012-09-09 DIAGNOSIS — Z8719 Personal history of other diseases of the digestive system: Secondary | ICD-10-CM | POA: Insufficient documentation

## 2012-09-09 DIAGNOSIS — Z8739 Personal history of other diseases of the musculoskeletal system and connective tissue: Secondary | ICD-10-CM | POA: Insufficient documentation

## 2012-09-09 LAB — URINALYSIS, ROUTINE W REFLEX MICROSCOPIC
Bilirubin Urine: NEGATIVE
Glucose, UA: NEGATIVE mg/dL
Hgb urine dipstick: NEGATIVE
Ketones, ur: NEGATIVE mg/dL
Leukocytes, UA: NEGATIVE
Nitrite: NEGATIVE
Protein, ur: 100 mg/dL — AB
Specific Gravity, Urine: 1.017 (ref 1.005–1.030)
Urobilinogen, UA: 0.2 mg/dL (ref 0.0–1.0)
pH: 6 (ref 5.0–8.0)

## 2012-09-09 LAB — CBC
HCT: 30.5 % — ABNORMAL LOW (ref 36.0–46.0)
Hemoglobin: 10.6 g/dL — ABNORMAL LOW (ref 12.0–15.0)
MCH: 31.5 pg (ref 26.0–34.0)
MCHC: 34.8 g/dL (ref 30.0–36.0)
MCV: 90.8 fL (ref 78.0–100.0)
Platelets: 203 10*3/uL (ref 150–400)
RBC: 3.36 MIL/uL — ABNORMAL LOW (ref 3.87–5.11)
RDW: 18.6 % — ABNORMAL HIGH (ref 11.5–15.5)
WBC: 5.5 10*3/uL (ref 4.0–10.5)

## 2012-09-09 LAB — LIPASE, BLOOD: Lipase: 40 U/L (ref 11–59)

## 2012-09-09 LAB — COMPREHENSIVE METABOLIC PANEL
ALT: 7 U/L (ref 0–35)
AST: 15 U/L (ref 0–37)
Albumin: 4.5 g/dL (ref 3.5–5.2)
Alkaline Phosphatase: 75 U/L (ref 39–117)
BUN: 34 mg/dL — ABNORMAL HIGH (ref 6–23)
CO2: 24 mEq/L (ref 19–32)
Calcium: 10.6 mg/dL — ABNORMAL HIGH (ref 8.4–10.5)
Chloride: 103 mEq/L (ref 96–112)
Creatinine, Ser: 2.71 mg/dL — ABNORMAL HIGH (ref 0.50–1.10)
GFR calc Af Amer: 23 mL/min — ABNORMAL LOW (ref >90)
GFR calc non Af Amer: 20 mL/min — ABNORMAL LOW (ref >90)
Glucose, Bld: 121 mg/dL — ABNORMAL HIGH (ref 70–99)
Potassium: 3.7 mEq/L (ref 3.5–5.1)
Sodium: 141 mEq/L (ref 135–145)
Total Bilirubin: 0.5 mg/dL (ref 0.3–1.2)
Total Protein: 7.3 g/dL (ref 6.0–8.3)

## 2012-09-09 LAB — URINE MICROSCOPIC-ADD ON

## 2012-09-09 MED ORDER — HYDROMORPHONE HCL PF 1 MG/ML IJ SOLN
1.0000 mg | Freq: Once | INTRAMUSCULAR | Status: AC
  Administered 2012-09-09: 1 mg via INTRAVENOUS
  Filled 2012-09-09: qty 1

## 2012-09-09 MED ORDER — ONDANSETRON HCL 4 MG/2ML IJ SOLN
4.0000 mg | Freq: Once | INTRAMUSCULAR | Status: AC
  Administered 2012-09-09: 4 mg via INTRAVENOUS

## 2012-09-09 MED ORDER — ONDANSETRON 8 MG/NS 50 ML IVPB
8.0000 mg | INTRAVENOUS | Status: DC
  Filled 2012-09-09: qty 8

## 2012-09-09 MED ORDER — ONDANSETRON HCL 4 MG/2ML IJ SOLN
INTRAMUSCULAR | Status: AC
  Filled 2012-09-09: qty 2

## 2012-09-09 MED ORDER — SODIUM CHLORIDE 0.9 % IV BOLUS (SEPSIS)
1000.0000 mL | Freq: Once | INTRAVENOUS | Status: AC
  Administered 2012-09-09: 1000 mL via INTRAVENOUS

## 2012-09-09 MED ORDER — METOCLOPRAMIDE HCL 5 MG/ML IJ SOLN
10.0000 mg | Freq: Once | INTRAMUSCULAR | Status: AC
  Administered 2012-09-09: 10 mg via INTRAVENOUS
  Filled 2012-09-09: qty 2

## 2012-09-09 MED ORDER — SODIUM CHLORIDE 0.9 % IV BOLUS (SEPSIS)
500.0000 mL | Freq: Once | INTRAVENOUS | Status: AC
  Administered 2012-09-09: 500 mL via INTRAVENOUS

## 2012-09-09 MED ORDER — METOCLOPRAMIDE HCL 10 MG PO TABS: 10.0000 mg | ORAL_TABLET | Freq: Four times a day (QID) | ORAL | Status: DC

## 2012-09-09 NOTE — ED Provider Notes (Signed)
CSN: [id number]     Arrival date & time 09/09/12  [id number] History     First MD Initiated Contact with Patient 09/09/12 [phone]     Chief Complaint  Patient presents with  . Emesis   (Consider location/radiation/quality/duration/timing/severity/associated sxs/prior Treatment) HPI 47 year old female with history of Gastroparesis, CKD s/p Kidney transplant presents with nausea/vomiting.  Patient reports intractable nausea/vomiting began suddenly on 8/6.  She has vomited frequently (unable to tell me how many times) and is currently vomiting. Emesis non-bloody, green in color.  She reports associated severe, diffuse abdominal pain 10/10 in severity. She also reports associated diarrhea.  This happens to her frequently and she has numerous episodes of n/v and subsequent ED visits.  Her symptoms typically resolve following administration of Reglan and pain medication.   Past Medical History  Diagnosis Date  . Gastroparesis   . Gout   . Hypotension   . Herpes genitalia 1994  . E coli bacteremia 06/18/2011  . Bacteremia due to Gram-negative bacteria 05/23/2011  . History of blood transfusion     "more than a few times" (12/11/2011)  . History of stomach ulcers   . Stroke      left basal ganglia lacunar infarct; Right frontal lobe lacunar infarct.  . Stroke ~ 1999; 2001    "briefly lost my vision; lost my right eye" (12/11/2011)  . Chronic lower back pain   . Depression   . Osteopenia   . Headache(784.0)     "not often anymore" (12/11/2011)  . Complication of anesthesia     "woke up during OR; I have an extremely high tolerance" (12/11/2011)  . Dysrhythmia     "tachycardia" (12/11/2011)  . ESRD (end stage renal disease) 06/12/2011  . Iron deficiency anemia   . Seizures 1994    "post transplant; only have had that one" (12/11/2011)  . GERD (gastroesophageal reflux disease)    Past Surgical History  Procedure Laterality Date  . Kidney transplant  1994; 1999; 2005    "right"  . Appendectomy   ~ 2004  . 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 Total nephrectomy  1988?; 1994; 2005  . Multiple tooth extractions    . Enucleation  2001    "right"  . Esophagogastroduodenoscopy (egd) with propofol N/A 04/21/2012    Procedure: ESOPHAGOGASTRODUODENOSCOPY (EGD) WITH PROPOFOL;  Surgeon: Milus Banister, MD;  Location: [facility];  Service: Endoscopy;  Laterality: N/A;   Family History  Problem Relation Age of Onset  . Hypertension Maternal Grandmother    History  Substance Use Topics  . Smoking status: Former Smoker -- 0.50 packs/day for 28 years    Types: Cigarettes    Quit date: 03/05/2011  . Smokeless tobacco: Never Used  . Alcohol Use: Yes     Comment: uses daily   OB History   Grav Para Term Preterm Abortions TAB SAB Ect Mult Living                 Review of Systems  Constitutional: Positive for appetite change. Negative for fever and chills.       Reports Dehydration  Respiratory: Negative for shortness of breath.   Gastrointestinal: Positive for nausea, vomiting, abdominal pain and diarrhea. Negative for constipation and blood in stool.  Skin: Negative for rash.  Allergic/Immunologic: Positive for immunocompromised state.    Allergies  Levofloxacin  Home Medications   Current Outpatient  Rx  Name  Route  Sig  Dispense  Refill  . aspirin EC 81 MG tablet   Oral   Take 81 mg by mouth daily.         [name] azaTHIOprine (IMURAN) 50 MG tablet   Oral   Take 100 mg by mouth daily before breakfast.          . calcitRIOL (ROCALTROL) 0.25 MCG capsule   Oral   Take 0.25 mcg by mouth daily before breakfast.          . colchicine 0.6 MG tablet   Oral   Take 0.6 mg by mouth daily as needed (For gout.).         . Darbepoetin Alfa-Polysorbate (ARANESP, ALB FREE, SURECLICK IJ)   Injection   Inject as directed every 14 (fourteen) days. Given at [facility] stay         . furosemide (LASIX) 40 MG  tablet   Oral   Take 40 mg by mouth every evening.          [name] Hydrocodone-Acetaminophen 7.5-300 MG TABS   Oral   Take 1-2 tablets by mouth every 6 (six) hours as needed (as needed for Pain).   20 each   0   . metoCLOPramide (REGLAN) 5 MG tablet   Oral   Take 5 mg by mouth 4 (four) times daily.         . Multiple Vitamin (MULITIVITAMIN WITH MINERALS) TABS   Oral   Take 1 tablet by mouth daily.         [name] omeprazole (PRILOSEC) 40 MG capsule   Oral   Take 40 mg by mouth 2 (two) times daily.          . ondansetron (ZOFRAN-ODT) 4 MG disintegrating tablet   Oral   Take 4 mg by mouth every 4 (four) hours as needed for nausea.         . predniSONE (DELTASONE) 5 MG tablet   Oral   Take 5 mg by mouth daily.         . promethazine (PHENERGAN) 25 MG tablet   Oral   Take 25 mg by mouth every 6 (six) hours as needed for nausea.          [name] tacrolimus (PROGRAF) 1 MG capsule   Oral   Take 3 mg by mouth 2 (two) times daily.          . metoCLOPramide (REGLAN) 10 MG tablet   Oral   Take 1 tablet (10 mg total) by mouth 4 (four) times daily.   120 tablet   0    BP 94/48  Pulse 59  Temp(Src) 97.8 F (36.6 C) (Oral)  Resp 18  SpO2 100% Physical Exam  Constitutional: She is oriented to person, place, and time. She appears well-developed and well-nourished.  Moderate distress secondary to n/v and abdominal pain.  HENT:  Head: Normocephalic and atraumatic.  Dry mucous membranes.  Eyes: No scleral icterus.  Cardiovascular: Normal rate and regular rhythm.   No murmur heard. Pulmonary/Chest: Effort normal and breath sounds normal. No respiratory distress. She has no wheezes. She has no rales.  Abdominal: Soft. Bowel sounds are normal. She exhibits no distension. There is tenderness. There is no rebound and no guarding.  Diffusely tender to palpation.  Musculoskeletal: She exhibits no edema.  Neurological: She is alert and oriented to person, place, and time.  Skin:  Skin is warm and dry.  Lower extremity hyperpigmentation noted.  ED Course   Procedures (including critical care time)  Labs Reviewed  CBC - Abnormal; Notable for the following:    RBC 3.36 (*)    Hemoglobin 10.6 (*)    HCT 30.5 (*)    RDW 18.6 (*)    All other components within normal limits  COMPREHENSIVE METABOLIC PANEL - Abnormal; Notable for the following:    Glucose, Bld 121 (*)    BUN 34 (*)    Creatinine, Ser 2.71 (*)    Calcium 10.6 (*)    GFR calc non Af Amer 20 (*)    GFR calc Af Amer 23 (*)    All other components within normal limits  LIPASE, BLOOD  URINALYSIS, ROUTINE W REFLEX MICROSCOPIC   No results found. 1. Intractable nausea and vomiting   2. Dehydration   3. Gastroparesis    MDM  47 year old female with Gastroparesis and CKD s/p kidney transplant who presents with intractable nausea/vomiting.  This appears to be secondary to Gastroparesis. - Will obtain CBC, CMP, Lipase. - Will treat will IV fluids, Reglan, and IV Dilaudid and monitor for improvement.  0900 - Creatinine at baseline. Anemia noted but this is baseline as well.  Lipase WNL.  Patient markedly improved with above therapy.  Will give additional 500 mL NS bolus.  Patient symptoms improved and is stable for discharge home.  Coral Spikes, DO 09/09/12 1229

## 2012-09-09 NOTE — ED Notes (Signed)
Per family: pt vomiting since going to dentist on Wednesday; pt actively vomiting

## 2012-09-09 NOTE — ED Provider Notes (Signed)
I saw and evaluated the patient, reviewed the resident's note and I agree with the findings and plan. Patient presents today with complaints of nausea and vomiting. She has a history of renal transplant and also experiences recurrent bouts of gastroparesis. She has been seen here multiple times in the past for the same. This episode feels similar to the episode she has had in the past. She denies any fevers or chills. She denies any hematemesis or hematochezia.  On exam vitals are stable and the patient is afebrile. Heart is regular rate and rhythm without murmurs rubs or gallops. Lungs are clear to auscultation and equal. The abdomen reveals generalized tenderness to palpation with no rebound or guarding. There is no CVA tenderness. The mucous membranes are moist and skin has good turgor.  The patient presents with what appears to be a recurrent episode of gastroparesis. She was given her usual medications and did begin to feel much better. Laboratory studies reveal no elevation of her creatinine above her baseline and the remainder of the electrolytes look acceptable. She is feeling better and is tolerating by mouth intake. I feel as though she is stable for discharge to return when necessary.  Veryl Speak, MD 09/09/12 [phone]

## 2012-09-13 ENCOUNTER — Emergency Department (HOSPITAL_COMMUNITY): Payer: Medicaid Other

## 2012-09-13 ENCOUNTER — Emergency Department (HOSPITAL_COMMUNITY)
Admission: EM | Admit: 2012-09-13 | Discharge: 2012-09-13 | Disposition: A | Discharge status: Home / Self Care | Payer: Medicaid Other | Attending: Emergency Medicine | Admitting: Emergency Medicine

## 2012-09-13 ENCOUNTER — Encounter (HOSPITAL_COMMUNITY): Payer: Self-pay | Admitting: Nurse Practitioner

## 2012-09-13 DIAGNOSIS — Z940 Kidney transplant status: Secondary | ICD-10-CM | POA: Insufficient documentation

## 2012-09-13 DIAGNOSIS — Z8719 Personal history of other diseases of the digestive system: Secondary | ICD-10-CM | POA: Insufficient documentation

## 2012-09-13 DIAGNOSIS — Z862 Personal history of diseases of the blood and blood-forming organs and certain disorders involving the immune mechanism: Secondary | ICD-10-CM | POA: Insufficient documentation

## 2012-09-13 DIAGNOSIS — R1084 Generalized abdominal pain: Secondary | ICD-10-CM | POA: Insufficient documentation

## 2012-09-13 DIAGNOSIS — G8929 Other chronic pain: Secondary | ICD-10-CM

## 2012-09-13 DIAGNOSIS — Z8673 Personal history of transient ischemic attack (TIA), and cerebral infarction without residual deficits: Secondary | ICD-10-CM | POA: Insufficient documentation

## 2012-09-13 DIAGNOSIS — Z3202 Encounter for pregnancy test, result negative: Secondary | ICD-10-CM | POA: Insufficient documentation

## 2012-09-13 DIAGNOSIS — Z9889 Other specified postprocedural states: Secondary | ICD-10-CM | POA: Insufficient documentation

## 2012-09-13 DIAGNOSIS — K219 Gastro-esophageal reflux disease without esophagitis: Secondary | ICD-10-CM | POA: Insufficient documentation

## 2012-09-13 DIAGNOSIS — R109 Unspecified abdominal pain: Secondary | ICD-10-CM

## 2012-09-13 DIAGNOSIS — Z8669 Personal history of other diseases of the nervous system and sense organs: Secondary | ICD-10-CM | POA: Insufficient documentation

## 2012-09-13 DIAGNOSIS — Z9089 Acquired absence of other organs: Secondary | ICD-10-CM | POA: Insufficient documentation

## 2012-09-13 DIAGNOSIS — N186 End stage renal disease: Secondary | ICD-10-CM | POA: Insufficient documentation

## 2012-09-13 DIAGNOSIS — Z7982 Long term (current) use of aspirin: Secondary | ICD-10-CM | POA: Insufficient documentation

## 2012-09-13 DIAGNOSIS — Z8679 Personal history of other diseases of the circulatory system: Secondary | ICD-10-CM | POA: Insufficient documentation

## 2012-09-13 DIAGNOSIS — Z79899 Other long term (current) drug therapy: Secondary | ICD-10-CM | POA: Insufficient documentation

## 2012-09-13 DIAGNOSIS — Z992 Dependence on renal dialysis: Secondary | ICD-10-CM | POA: Insufficient documentation

## 2012-09-13 DIAGNOSIS — M545 Low back pain, unspecified: Secondary | ICD-10-CM | POA: Insufficient documentation

## 2012-09-13 DIAGNOSIS — IMO0002 Reserved for concepts with insufficient information to code with codable children: Secondary | ICD-10-CM | POA: Insufficient documentation

## 2012-09-13 DIAGNOSIS — I120 Hypertensive chronic kidney disease with stage 5 chronic kidney disease or end stage renal disease: Secondary | ICD-10-CM | POA: Insufficient documentation

## 2012-09-13 DIAGNOSIS — Z8739 Personal history of other diseases of the musculoskeletal system and connective tissue: Secondary | ICD-10-CM | POA: Insufficient documentation

## 2012-09-13 DIAGNOSIS — Z8619 Personal history of other infectious and parasitic diseases: Secondary | ICD-10-CM | POA: Insufficient documentation

## 2012-09-13 DIAGNOSIS — M109 Gout, unspecified: Secondary | ICD-10-CM | POA: Insufficient documentation

## 2012-09-13 DIAGNOSIS — Z87891 Personal history of nicotine dependence: Secondary | ICD-10-CM | POA: Insufficient documentation

## 2012-09-13 LAB — COMPREHENSIVE METABOLIC PANEL
ALT: 10 U/L (ref 0–35)
AST: 22 U/L (ref 0–37)
Albumin: 4.4 g/dL (ref 3.5–5.2)
Alkaline Phosphatase: 68 U/L (ref 39–117)
BUN: 28 mg/dL — ABNORMAL HIGH (ref 6–23)
CO2: 23 mEq/L (ref 19–32)
Calcium: 10.4 mg/dL (ref 8.4–10.5)
Chloride: 101 mEq/L (ref 96–112)
Creatinine, Ser: 2.16 mg/dL — ABNORMAL HIGH (ref 0.50–1.10)
GFR calc Af Amer: 31 mL/min — ABNORMAL LOW (ref >90)
GFR calc non Af Amer: 26 mL/min — ABNORMAL LOW (ref >90)
Glucose, Bld: 96 mg/dL (ref 70–99)
Potassium: 3.6 mEq/L (ref 3.5–5.1)
Sodium: 139 mEq/L (ref 135–145)
Total Bilirubin: 0.7 mg/dL (ref 0.3–1.2)
Total Protein: 7.2 g/dL (ref 6.0–8.3)

## 2012-09-13 LAB — URINALYSIS, ROUTINE W REFLEX MICROSCOPIC
Bilirubin Urine: NEGATIVE
Glucose, UA: NEGATIVE mg/dL
Hgb urine dipstick: NEGATIVE
Ketones, ur: 15 mg/dL — AB
Leukocytes, UA: NEGATIVE
Nitrite: NEGATIVE
Protein, ur: 100 mg/dL — AB
Specific Gravity, Urine: 1.013 (ref 1.005–1.030)
Urobilinogen, UA: 0.2 mg/dL (ref 0.0–1.0)
pH: 6 (ref 5.0–8.0)

## 2012-09-13 LAB — LIPASE, BLOOD: Lipase: 41 U/L (ref 11–59)

## 2012-09-13 LAB — CBC WITH DIFFERENTIAL/PLATELET
Basophils Absolute: 0 10*3/uL (ref 0.0–0.1)
Basophils Relative: 0 % (ref 0–1)
Eosinophils Absolute: 0.1 10*3/uL (ref 0.0–0.7)
Eosinophils Relative: 1 % (ref 0–5)
HCT: 28.9 % — ABNORMAL LOW (ref 36.0–46.0)
Hemoglobin: 10.3 g/dL — ABNORMAL LOW (ref 12.0–15.0)
Lymphocytes Relative: 11 % — ABNORMAL LOW (ref 12–46)
Lymphs Abs: 0.5 10*3/uL — ABNORMAL LOW (ref 0.7–4.0)
MCH: 32 pg (ref 26.0–34.0)
MCHC: 35.6 g/dL (ref 30.0–36.0)
MCV: 89.8 fL (ref 78.0–100.0)
Monocytes Absolute: 0.3 10*3/uL (ref 0.1–1.0)
Monocytes Relative: 7 % (ref 3–12)
Neutro Abs: 4 10*3/uL (ref 1.7–7.7)
Neutrophils Relative %: 82 % — ABNORMAL HIGH (ref 43–77)
Platelets: 201 10*3/uL (ref 150–400)
RBC: 3.22 MIL/uL — ABNORMAL LOW (ref 3.87–5.11)
RDW: 18.7 % — ABNORMAL HIGH (ref 11.5–15.5)
WBC: 4.9 10*3/uL (ref 4.0–10.5)

## 2012-09-13 LAB — URINE MICROSCOPIC-ADD ON

## 2012-09-13 LAB — POCT PREGNANCY, URINE: Preg Test, Ur: NEGATIVE

## 2012-09-13 MED ORDER — FENTANYL CITRATE 0.05 MG/ML IJ SOLN
100.0000 ug | Freq: Once | INTRAMUSCULAR | Status: AC
  Administered 2012-09-13: 100 ug via INTRAVENOUS
  Filled 2012-09-13: qty 2

## 2012-09-13 MED ORDER — DICYCLOMINE HCL 20 MG PO TABS: 20.0000 mg | ORAL_TABLET | Freq: Two times a day (BID) | ORAL | Status: DC

## 2012-09-13 MED ORDER — MORPHINE SULFATE 4 MG/ML IJ SOLN
4.0000 mg | Freq: Once | INTRAMUSCULAR | Status: AC
  Administered 2012-09-13: 4 mg via INTRAVENOUS
  Filled 2012-09-13: qty 1

## 2012-09-13 MED ORDER — ONDANSETRON HCL 4 MG/2ML IJ SOLN
4.0000 mg | Freq: Once | INTRAMUSCULAR | Status: AC
  Administered 2012-09-13: 4 mg via INTRAVENOUS
  Filled 2012-09-13: qty 2

## 2012-09-13 MED ORDER — METOCLOPRAMIDE HCL 5 MG/ML IJ SOLN
10.0000 mg | Freq: Once | INTRAMUSCULAR | Status: AC
  Administered 2012-09-13: 10 mg via INTRAVENOUS
  Filled 2012-09-13: qty 2

## 2012-09-13 MED ORDER — SODIUM CHLORIDE 0.9 % IV BOLUS (SEPSIS)
1000.0000 mL | Freq: Once | INTRAVENOUS | Status: AC
  Administered 2012-09-13: 1000 mL via INTRAVENOUS

## 2012-09-13 NOTE — ED Provider Notes (Signed)
CSN: [id number]     Arrival date & time 09/13/12  0920 History     First MD Initiated Contact with Patient 09/13/12 [phone]     Chief Complaint  Patient presents with  . Emesis   (Consider location/radiation/quality/duration/timing/severity/associated sxs/prior Treatment) HPI Comments: Patient is a 47 year old female with a past medical history of ESRD, kidney transplant, and gastroparesis who presents with abdominal pain for the past 2 days. The pain is located in her generalized abdomen and does not radiate. The pain is described as cramping and severe. The pain started gradually and progressively worsened since the onset. No alleviating/aggravating factors. The patient has tried reglan for symptoms without relief. Associated symptoms include nausea. Patient denies fever, headache, vomiting, diarrhea, chest pain, SOB, dysuria, constipation, abnormal vaginal bleeding/discharge. Last normal bowel movement today.     Patient is a 47 y.o. female presenting with vomiting.  Emesis Associated symptoms: abdominal pain     Past Medical History  Diagnosis Date  . Gastroparesis   . Gout   . Hypotension   . Herpes genitalia 1994  . E coli bacteremia 06/18/2011  . Bacteremia due to Gram-negative bacteria 05/23/2011  . History of blood transfusion     "more than a few times" (12/11/2011)  . History of stomach ulcers   . Stroke      left basal ganglia lacunar infarct; Right frontal lobe lacunar infarct.  . Stroke ~ 1999; 2001    "briefly lost my vision; lost my right eye" (12/11/2011)  . Chronic lower back pain   . Depression   . Osteopenia   . Headache(784.0)     "not often anymore" (12/11/2011)  . Complication of anesthesia     "woke up during OR; I have an extremely high tolerance" (12/11/2011)  . Dysrhythmia     "tachycardia" (12/11/2011)  . ESRD (end stage renal disease) 06/12/2011  . Iron deficiency anemia   . Seizures 1994    "post transplant; only have had that one" (12/11/2011)  .  GERD (gastroesophageal reflux disease)    Past Surgical History  Procedure Laterality Date  . Kidney transplant  1994; 1999; 2005    "right"  . Appendectomy  ~ 2004  . 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 Total nephrectomy  1988?; 1994; 2005  . Multiple tooth extractions    . Enucleation  2001    "right"  . Esophagogastroduodenoscopy (egd) with propofol N/A 04/21/2012    Procedure: ESOPHAGOGASTRODUODENOSCOPY (EGD) WITH PROPOFOL;  Surgeon: Milus Banister, MD;  Location: [facility];  Service: Endoscopy;  Laterality: N/A;   Family History  Problem Relation Age of Onset  . Hypertension Maternal Grandmother    History  Substance Use Topics  . Smoking status: Former Smoker -- 0.50 packs/day for 28 years    Types: Cigarettes    Quit date: 03/05/2011  . Smokeless tobacco: Never Used  . Alcohol Use: Yes     Comment: uses daily   OB History   Grav Para Term Preterm Abortions TAB SAB Ect Mult Living                 Review of Systems  Gastrointestinal: Positive for nausea, vomiting and abdominal pain.  All other systems reviewed and are negative.    Allergies  Levofloxacin  Home Medications   Current Outpatient Rx  Name  Route  Sig  Dispense  Refill  .  aspirin EC 81 MG tablet   Oral   Take 81 mg by mouth daily.         [name] azaTHIOprine (IMURAN) 50 MG tablet   Oral   Take 100 mg by mouth daily before breakfast.          . calcitRIOL (ROCALTROL) 0.25 MCG capsule   Oral   Take 0.25 mcg by mouth daily before breakfast.          . colchicine 0.6 MG tablet   Oral   Take 0.6 mg by mouth daily as needed (For gout.).         [name] furosemide (LASIX) 40 MG tablet   Oral   Take 40 mg by mouth every evening.          [name] Hydrocodone-Acetaminophen 7.5-300 MG TABS   Oral   Take 1-2 tablets by mouth every 6 (six) hours as needed (as needed for Pain).   20 each   0   . metoCLOPramide (REGLAN) 10 MG  tablet   Oral   Take 1 tablet (10 mg total) by mouth 4 (four) times daily.   120 tablet   0   . metoCLOPramide (REGLAN) 5 MG tablet   Oral   Take 5 mg by mouth 4 (four) times daily.         . Multiple Vitamin (MULITIVITAMIN WITH MINERALS) TABS   Oral   Take 1 tablet by mouth daily.         [name] omeprazole (PRILOSEC) 40 MG capsule   Oral   Take 40 mg by mouth 2 (two) times daily.          . ondansetron (ZOFRAN-ODT) 4 MG disintegrating tablet   Oral   Take 4 mg by mouth every 4 (four) hours as needed for nausea.         . predniSONE (DELTASONE) 5 MG tablet   Oral   Take 5 mg by mouth daily.         . promethazine (PHENERGAN) 25 MG tablet   Oral   Take 25 mg by mouth every 6 (six) hours as needed for nausea.          [name] tacrolimus (PROGRAF) 1 MG capsule   Oral   Take 3 mg by mouth 2 (two) times daily.           BP 131/89  Pulse 89  Temp(Src) 98.2 F (36.8 C) (Oral)  Resp 18  Ht 4\' 11"  (1.499 m)  Wt 100 lb (45.36 kg)  BMI 20.19 kg/m2  SpO2 100% Physical Exam  Nursing note and vitals reviewed. Constitutional: She is oriented to person, place, and time. She appears well-developed and well-nourished. No distress.  HENT:  Head: Normocephalic and atraumatic.  Eyes: Conjunctivae and EOM are normal. Pupils are equal, round, and reactive to light. No scleral icterus.  Neck: Normal range of motion.  Cardiovascular: Normal rate and regular rhythm.  Exam reveals no gallop and no friction rub.   No murmur heard. Pulmonary/Chest: Effort normal and breath sounds normal. She has no wheezes. She has no rales. She exhibits no tenderness.  Abdominal: Soft. She exhibits no distension. There is tenderness. There is no rebound and no guarding.  Generalized tenderness to palpation. No peritoneal signs or focal tenderness to palpation.   Musculoskeletal: Normal range of motion.  Neurological: She is alert and oriented to person, place, and time. Coordination normal.  Speech  is goal-oriented. Moves limbs without ataxia.   Skin: Skin is  warm and dry.  Psychiatric: She has a normal mood and affect. Her behavior is normal.    ED Course   Procedures (including critical care time)  Labs Reviewed  CBC WITH DIFFERENTIAL - Abnormal; Notable for the following:    RBC 3.22 (*)    Hemoglobin 10.3 (*)    HCT 28.9 (*)    RDW 18.7 (*)    Neutrophils Relative % 82 (*)    Lymphocytes Relative 11 (*)    Lymphs Abs 0.5 (*)    All other components within normal limits  COMPREHENSIVE METABOLIC PANEL - Abnormal; Notable for the following:    BUN 28 (*)    Creatinine, Ser 2.16 (*)    GFR calc non Af Amer 26 (*)    GFR calc Af Amer 31 (*)    All other components within normal limits  URINALYSIS, ROUTINE W REFLEX MICROSCOPIC - Abnormal; Notable for the following:    Ketones, ur 15 (*)    Protein, ur 100 (*)    All other components within normal limits  URINE MICROSCOPIC-ADD ON - Abnormal; Notable for the following:    Squamous Epithelial / LPF MANY (*)    All other components within normal limits  URINE CULTURE  LIPASE, BLOOD  POCT PREGNANCY, URINE   Dg Abd Acute W/chest  09/13/2012   *RADIOLOGY REPORT*  Clinical Data: Abdominal pain  ACUTE ABDOMEN SERIES (ABDOMEN 2 VIEW & CHEST 1 VIEW)  Comparison: Chest 04/09/2012  Findings:   SVC stent is noted and unchanged.  Negative for heart failure.  Elevated right hemidiaphragm with mild scarring in the right lung base.  This is unchanged.  Negative for infiltrate or effusion.  Surgical clips in the upper abdomen bilaterally and in the pelvis bilaterally.  Negative for bowel obstruction or ileus.  No free air.  No renal calculi.  IMPRESSION: No acute abnormality in the chest or abdomen.   Original Report Authenticated By: Carl Best, M.D.   1. Chronic abdominal pain     MDM  10:26 AM Labs and urinalysis pending. Patient will have zofran and morphine for pain and vomiting.   1:29 PM Patient show no acute changes and  urinalysis shows no infection or acute changes. Acute abdominal series shows no acute changes. Patient given fentanyl and reglan for pain. Patient will have another dose before being discharged. Patient will have recommended follow up with PCP. Patient recommended to start pain management for chronic abdominal pain.   Alvina Chou, PA-C 09/13/12 1343

## 2012-09-13 NOTE — ED Notes (Signed)
Pt reports she was recently seen here for n/v/d and symptoms have not improved. Took reglan at home this am with no relief. C/o severe abd pain as well

## 2012-09-13 NOTE — ED Notes (Signed)
Discharge instructions reviewed with pt. Pt verbalized understanding.   

## 2012-09-13 NOTE — ED Notes (Signed)
Pt dry heaving, restless in the bed. Having her friend rub her back.

## 2012-09-13 NOTE — ED Notes (Signed)
Pt is relaxing more, lying still on her side. Dry heaving and teeth chattering have subsided.

## 2012-09-14 DIAGNOSIS — R9389 Abnormal findings on diagnostic imaging of other specified body structures: Secondary | ICD-10-CM | POA: Insufficient documentation

## 2012-09-14 LAB — URINE CULTURE
Colony Count: 40000
Special Requests: NORMAL

## 2012-09-14 NOTE — ED Provider Notes (Signed)
Medical screening examination/treatment/procedure(s) were performed by non-physician practitioner and as supervising physician I was immediately available for consultation/collaboration.   Alfonzo Feller, DO 09/14/12 2222

## 2012-09-16 ENCOUNTER — Encounter (HOSPITAL_COMMUNITY)
Admission: RE | Admit: 2012-09-16 | Discharge: 2012-09-16 | Disposition: A | Discharge status: Home / Self Care | Payer: Medicaid Other | Source: Ambulatory Visit | Attending: Nephrology | Admitting: Nephrology

## 2012-09-16 LAB — POCT HEMOGLOBIN-HEMACUE: Hemoglobin: 8.8 g/dL — ABNORMAL LOW (ref 12.0–15.0)

## 2012-09-16 MED ORDER — EPOETIN ALFA 20000 UNIT/ML IJ SOLN: 20000.0000 [IU] | INTRAMUSCULAR | Status: DC

## 2012-09-16 MED ORDER — EPOETIN ALFA 20000 UNIT/ML IJ SOLN
INTRAMUSCULAR | Status: AC
  Administered 2012-09-16: 20000 [IU] via SUBCUTANEOUS
  Filled 2012-09-16: qty 1

## 2012-09-20 ENCOUNTER — Telehealth: Payer: Self-pay | Admitting: Gastroenterology

## 2012-09-20 NOTE — Telephone Encounter (Signed)
Received 8 pages from [facility], sent to Dr. Ardis Hughs. 09/20/12/ss

## 2012-09-27 ENCOUNTER — Telehealth: Payer: Self-pay | Admitting: Gastroenterology

## 2012-09-27 NOTE — Telephone Encounter (Signed)
Rec'd records from [place] to [place]

## 2012-10-07 ENCOUNTER — Encounter (HOSPITAL_COMMUNITY)
Admission: RE | Admit: 2012-10-07 | Discharge: 2012-10-07 | Disposition: A | Discharge status: Home / Self Care | Payer: Medicaid Other | Source: Ambulatory Visit | Attending: Nephrology | Admitting: Nephrology

## 2012-10-07 DIAGNOSIS — N183 Chronic kidney disease, stage 3 unspecified: Secondary | ICD-10-CM | POA: Insufficient documentation

## 2012-10-07 DIAGNOSIS — D638 Anemia in other chronic diseases classified elsewhere: Secondary | ICD-10-CM | POA: Insufficient documentation

## 2012-10-07 LAB — RENAL FUNCTION PANEL
Albumin: 4.1 g/dL (ref 3.5–5.2)
BUN: 27 mg/dL — ABNORMAL HIGH (ref 6–23)
CO2: 25 mEq/L (ref 19–32)
Calcium: 9.8 mg/dL (ref 8.4–10.5)
Chloride: 101 mEq/L (ref 96–112)
Creatinine, Ser: 2.14 mg/dL — ABNORMAL HIGH (ref 0.50–1.10)
GFR calc Af Amer: 31 mL/min — ABNORMAL LOW (ref >90)
GFR calc non Af Amer: 27 mL/min — ABNORMAL LOW (ref >90)
Glucose, Bld: 90 mg/dL (ref 70–99)
Phosphorus: 3.2 mg/dL (ref 2.3–4.6)
Potassium: 4.1 mEq/L (ref 3.5–5.1)
Sodium: 138 mEq/L (ref 135–145)

## 2012-10-07 LAB — POCT HEMOGLOBIN-HEMACUE: Hemoglobin: 9.4 g/dL — ABNORMAL LOW (ref 12.0–15.0)

## 2012-10-07 MED ORDER — EPOETIN ALFA 20000 UNIT/ML IJ SOLN
20000.0000 [IU] | INTRAMUSCULAR | Status: DC
  Administered 2012-10-07: 20000 [IU] via SUBCUTANEOUS

## 2012-10-07 MED ORDER — EPOETIN ALFA 20000 UNIT/ML IJ SOLN
INTRAMUSCULAR | Status: AC
  Filled 2012-10-07: qty 1

## 2012-10-11 ENCOUNTER — Emergency Department (HOSPITAL_COMMUNITY)
Admission: EM | Admit: 2012-10-11 | Discharge: 2012-10-11 | Disposition: A | Discharge status: Home / Self Care | Payer: No Typology Code available for payment source | Attending: Emergency Medicine | Admitting: Emergency Medicine

## 2012-10-11 ENCOUNTER — Emergency Department (HOSPITAL_COMMUNITY): Payer: No Typology Code available for payment source

## 2012-10-11 ENCOUNTER — Encounter (HOSPITAL_COMMUNITY): Payer: Self-pay | Admitting: *Deleted

## 2012-10-11 DIAGNOSIS — Z7982 Long term (current) use of aspirin: Secondary | ICD-10-CM | POA: Insufficient documentation

## 2012-10-11 DIAGNOSIS — Z8711 Personal history of peptic ulcer disease: Secondary | ICD-10-CM | POA: Insufficient documentation

## 2012-10-11 DIAGNOSIS — Z87891 Personal history of nicotine dependence: Secondary | ICD-10-CM | POA: Insufficient documentation

## 2012-10-11 DIAGNOSIS — Z862 Personal history of diseases of the blood and blood-forming organs and certain disorders involving the immune mechanism: Secondary | ICD-10-CM | POA: Diagnosis not present

## 2012-10-11 DIAGNOSIS — Z8673 Personal history of transient ischemic attack (TIA), and cerebral infarction without residual deficits: Secondary | ICD-10-CM | POA: Insufficient documentation

## 2012-10-11 DIAGNOSIS — K3184 Gastroparesis: Secondary | ICD-10-CM | POA: Insufficient documentation

## 2012-10-11 DIAGNOSIS — Z8669 Personal history of other diseases of the nervous system and sense organs: Secondary | ICD-10-CM | POA: Diagnosis not present

## 2012-10-11 DIAGNOSIS — S46909A Unspecified injury of unspecified muscle, fascia and tendon at shoulder and upper arm level, unspecified arm, initial encounter: Secondary | ICD-10-CM | POA: Insufficient documentation

## 2012-10-11 DIAGNOSIS — G8929 Other chronic pain: Secondary | ICD-10-CM | POA: Diagnosis not present

## 2012-10-11 DIAGNOSIS — IMO0002 Reserved for concepts with insufficient information to code with codable children: Secondary | ICD-10-CM | POA: Insufficient documentation

## 2012-10-11 DIAGNOSIS — Y92410 Unspecified street and highway as the place of occurrence of the external cause: Secondary | ICD-10-CM | POA: Insufficient documentation

## 2012-10-11 DIAGNOSIS — K219 Gastro-esophageal reflux disease without esophagitis: Secondary | ICD-10-CM | POA: Diagnosis not present

## 2012-10-11 DIAGNOSIS — N186 End stage renal disease: Secondary | ICD-10-CM | POA: Diagnosis not present

## 2012-10-11 DIAGNOSIS — Z8619 Personal history of other infectious and parasitic diseases: Secondary | ICD-10-CM | POA: Insufficient documentation

## 2012-10-11 DIAGNOSIS — Z9189 Other specified personal risk factors, not elsewhere classified: Secondary | ICD-10-CM | POA: Diagnosis not present

## 2012-10-11 DIAGNOSIS — Y9389 Activity, other specified: Secondary | ICD-10-CM | POA: Diagnosis not present

## 2012-10-11 DIAGNOSIS — Z79899 Other long term (current) drug therapy: Secondary | ICD-10-CM | POA: Diagnosis not present

## 2012-10-11 DIAGNOSIS — M109 Gout, unspecified: Secondary | ICD-10-CM | POA: Diagnosis not present

## 2012-10-11 DIAGNOSIS — M25511 Pain in right shoulder: Secondary | ICD-10-CM

## 2012-10-11 DIAGNOSIS — S4980XA Other specified injuries of shoulder and upper arm, unspecified arm, initial encounter: Secondary | ICD-10-CM | POA: Insufficient documentation

## 2012-10-11 LAB — CBC WITH DIFFERENTIAL/PLATELET
Basophils Absolute: 0 10*3/uL (ref 0.0–0.1)
Basophils Relative: 0 % (ref 0–1)
Eosinophils Absolute: 0.1 10*3/uL (ref 0.0–0.7)
Eosinophils Relative: 2 % (ref 0–5)
HCT: 27.8 % — ABNORMAL LOW (ref 36.0–46.0)
Hemoglobin: 9.8 g/dL — ABNORMAL LOW (ref 12.0–15.0)
Lymphocytes Relative: 11 % — ABNORMAL LOW (ref 12–46)
Lymphs Abs: 0.4 10*3/uL — ABNORMAL LOW (ref 0.7–4.0)
MCH: 32.3 pg (ref 26.0–34.0)
MCHC: 35.3 g/dL (ref 30.0–36.0)
MCV: 91.7 fL (ref 78.0–100.0)
Monocytes Absolute: 0.2 10*3/uL (ref 0.1–1.0)
Monocytes Relative: 5 % (ref 3–12)
Neutro Abs: 3.4 10*3/uL (ref 1.7–7.7)
Neutrophils Relative %: 83 % — ABNORMAL HIGH (ref 43–77)
Platelets: 173 10*3/uL (ref 150–400)
RBC: 3.03 MIL/uL — ABNORMAL LOW (ref 3.87–5.11)
RDW: 18.6 % — ABNORMAL HIGH (ref 11.5–15.5)
WBC: 4.1 10*3/uL (ref 4.0–10.5)

## 2012-10-11 LAB — BASIC METABOLIC PANEL
BUN: 28 mg/dL — ABNORMAL HIGH (ref 6–23)
CO2: 23 mEq/L (ref 19–32)
Calcium: 10.1 mg/dL (ref 8.4–10.5)
Chloride: 100 mEq/L (ref 96–112)
Creatinine, Ser: 2.57 mg/dL — ABNORMAL HIGH (ref 0.50–1.10)
GFR calc Af Amer: 25 mL/min — ABNORMAL LOW (ref >90)
GFR calc non Af Amer: 21 mL/min — ABNORMAL LOW (ref >90)
Glucose, Bld: 95 mg/dL (ref 70–99)
Potassium: 4.1 mEq/L (ref 3.5–5.1)
Sodium: 137 mEq/L (ref 135–145)

## 2012-10-11 LAB — URINALYSIS, ROUTINE W REFLEX MICROSCOPIC
Glucose, UA: NEGATIVE mg/dL
Hgb urine dipstick: NEGATIVE
Ketones, ur: NEGATIVE mg/dL
Nitrite: NEGATIVE
Protein, ur: 100 mg/dL — AB
Specific Gravity, Urine: 1.016 (ref 1.005–1.030)
Urobilinogen, UA: 1 mg/dL (ref 0.0–1.0)
pH: 5.5 (ref 5.0–8.0)

## 2012-10-11 LAB — URINE MICROSCOPIC-ADD ON

## 2012-10-11 MED ORDER — ONDANSETRON 4 MG PO TBDP
4.0000 mg | ORAL_TABLET | Freq: Once | ORAL | Status: DC
  Filled 2012-10-11: qty 1

## 2012-10-11 MED ORDER — OXYCODONE HCL 5 MG PO TABS
5.0000 mg | ORAL_TABLET | Freq: Once | ORAL | Status: AC
  Administered 2012-10-11: 5 mg via ORAL
  Filled 2012-10-11: qty 1

## 2012-10-11 NOTE — ED Provider Notes (Signed)
Medical screening examination/treatment/procedure(s) were performed by non-physician practitioner and as supervising physician I was immediately available for consultation/collaboration.   Saddie Benders. Jaselyn Nahm, MD 10/11/12 1900
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
## 2012-10-11 NOTE — ED Notes (Signed)
Pt st's she vomited the pain med.  PA made aware.

## 2012-10-11 NOTE — ED Notes (Signed)
ACUITY CHANGED DUE TO HX OF TRANSPLANT.

## 2012-10-11 NOTE — ED Notes (Signed)
ROBERT, PA ORDERED PT TO MOVED TO ACUTE BED.

## 2012-10-11 NOTE — ED Notes (Signed)
Pt dc to home.  Pt sts understanding to dc instructions. Pt ambulatory to exit without difficulty. Denies need for w/c.
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
## 2012-10-13 LAB — URINE CULTURE: Colony Count: 50000

## 2012-10-21 ENCOUNTER — Encounter (HOSPITAL_COMMUNITY)
Admission: RE | Admit: 2012-10-21 | Discharge: 2012-10-21 | Disposition: A | Discharge status: Home / Self Care | Payer: Medicaid Other | Source: Ambulatory Visit | Attending: Nephrology | Admitting: Nephrology

## 2012-10-21 LAB — POCT HEMOGLOBIN-HEMACUE: Hemoglobin: 9.3 g/dL — ABNORMAL LOW (ref 12.0–15.0)

## 2012-10-21 MED ORDER — EPOETIN ALFA 20000 UNIT/ML IJ SOLN
INTRAMUSCULAR | Status: AC
  Administered 2012-10-21: 20000 [IU] via SUBCUTANEOUS
  Filled 2012-10-21: qty 1

## 2012-10-21 MED ORDER — EPOETIN ALFA 20000 UNIT/ML IJ SOLN: 30000.0000 [IU] | Freq: Once | INTRAMUSCULAR | Status: DC

## 2012-10-21 MED ORDER — EPOETIN ALFA 20000 UNIT/ML IJ SOLN: 20000.0000 [IU] | INTRAMUSCULAR | Status: DC

## 2012-10-21 MED ORDER — EPOETIN ALFA 10000 UNIT/ML IJ SOLN
INTRAMUSCULAR | Status: AC
  Administered 2012-10-21: 10000 [IU] via SUBCUTANEOUS
  Filled 2012-10-21: qty 1

## 2012-10-29 ENCOUNTER — Emergency Department (HOSPITAL_COMMUNITY)
Admission: EM | Admit: 2012-10-29 | Discharge: 2012-10-29 | Disposition: A | Discharge status: Home / Self Care | Payer: Medicaid Other | Attending: Emergency Medicine | Admitting: Emergency Medicine

## 2012-10-29 ENCOUNTER — Encounter (HOSPITAL_COMMUNITY): Payer: Self-pay | Admitting: *Deleted

## 2012-10-29 DIAGNOSIS — Z79899 Other long term (current) drug therapy: Secondary | ICD-10-CM | POA: Insufficient documentation

## 2012-10-29 DIAGNOSIS — Z87891 Personal history of nicotine dependence: Secondary | ICD-10-CM | POA: Insufficient documentation

## 2012-10-29 DIAGNOSIS — R509 Fever, unspecified: Secondary | ICD-10-CM | POA: Insufficient documentation

## 2012-10-29 DIAGNOSIS — Z862 Personal history of diseases of the blood and blood-forming organs and certain disorders involving the immune mechanism: Secondary | ICD-10-CM | POA: Insufficient documentation

## 2012-10-29 DIAGNOSIS — R112 Nausea with vomiting, unspecified: Secondary | ICD-10-CM | POA: Insufficient documentation

## 2012-10-29 DIAGNOSIS — R1110 Vomiting, unspecified: Secondary | ICD-10-CM

## 2012-10-29 DIAGNOSIS — R109 Unspecified abdominal pain: Secondary | ICD-10-CM | POA: Insufficient documentation

## 2012-10-29 DIAGNOSIS — Z8679 Personal history of other diseases of the circulatory system: Secondary | ICD-10-CM | POA: Insufficient documentation

## 2012-10-29 DIAGNOSIS — G8929 Other chronic pain: Secondary | ICD-10-CM | POA: Insufficient documentation

## 2012-10-29 DIAGNOSIS — Z7982 Long term (current) use of aspirin: Secondary | ICD-10-CM | POA: Insufficient documentation

## 2012-10-29 DIAGNOSIS — IMO0002 Reserved for concepts with insufficient information to code with codable children: Secondary | ICD-10-CM | POA: Insufficient documentation

## 2012-10-29 DIAGNOSIS — Z8673 Personal history of transient ischemic attack (TIA), and cerebral infarction without residual deficits: Secondary | ICD-10-CM | POA: Insufficient documentation

## 2012-10-29 DIAGNOSIS — Z8619 Personal history of other infectious and parasitic diseases: Secondary | ICD-10-CM | POA: Insufficient documentation

## 2012-10-29 DIAGNOSIS — Z8659 Personal history of other mental and behavioral disorders: Secondary | ICD-10-CM | POA: Insufficient documentation

## 2012-10-29 DIAGNOSIS — N186 End stage renal disease: Secondary | ICD-10-CM | POA: Insufficient documentation

## 2012-10-29 DIAGNOSIS — Z8739 Personal history of other diseases of the musculoskeletal system and connective tissue: Secondary | ICD-10-CM | POA: Insufficient documentation

## 2012-10-29 DIAGNOSIS — K219 Gastro-esophageal reflux disease without esophagitis: Secondary | ICD-10-CM | POA: Insufficient documentation

## 2012-10-29 DIAGNOSIS — M109 Gout, unspecified: Secondary | ICD-10-CM | POA: Insufficient documentation

## 2012-10-29 LAB — CBC WITH DIFFERENTIAL/PLATELET
Basophils Absolute: 0 10*3/uL (ref 0.0–0.1)
Basophils Relative: 0 % (ref 0–1)
Eosinophils Absolute: 0 10*3/uL (ref 0.0–0.7)
Eosinophils Relative: 1 % (ref 0–5)
HCT: 28.8 % — ABNORMAL LOW (ref 36.0–46.0)
Hemoglobin: 10 g/dL — ABNORMAL LOW (ref 12.0–15.0)
Lymphocytes Relative: 12 % (ref 12–46)
Lymphs Abs: 0.4 10*3/uL — ABNORMAL LOW (ref 0.7–4.0)
MCH: 32.3 pg (ref 26.0–34.0)
MCHC: 34.7 g/dL (ref 30.0–36.0)
MCV: 92.9 fL (ref 78.0–100.0)
Monocytes Absolute: 0.2 10*3/uL (ref 0.1–1.0)
Monocytes Relative: 7 % (ref 3–12)
Neutro Abs: 2.6 10*3/uL (ref 1.7–7.7)
Neutrophils Relative %: 80 % — ABNORMAL HIGH (ref 43–77)
Platelets: 207 10*3/uL (ref 150–400)
RBC: 3.1 MIL/uL — ABNORMAL LOW (ref 3.87–5.11)
RDW: 19.3 % — ABNORMAL HIGH (ref 11.5–15.5)
WBC: 3.2 10*3/uL — ABNORMAL LOW (ref 4.0–10.5)

## 2012-10-29 LAB — COMPREHENSIVE METABOLIC PANEL
ALT: 5 U/L (ref 0–35)
AST: 14 U/L (ref 0–37)
Albumin: 4.3 g/dL (ref 3.5–5.2)
Alkaline Phosphatase: 72 U/L (ref 39–117)
BUN: 20 mg/dL (ref 6–23)
CO2: 24 mEq/L (ref 19–32)
Calcium: 10.9 mg/dL — ABNORMAL HIGH (ref 8.4–10.5)
Chloride: 100 mEq/L (ref 96–112)
Creatinine, Ser: 1.78 mg/dL — ABNORMAL HIGH (ref 0.50–1.10)
GFR calc Af Amer: 39 mL/min — ABNORMAL LOW (ref >90)
GFR calc non Af Amer: 33 mL/min — ABNORMAL LOW (ref >90)
Glucose, Bld: 90 mg/dL (ref 70–99)
Potassium: 4 mEq/L (ref 3.5–5.1)
Sodium: 137 mEq/L (ref 135–145)
Total Bilirubin: 0.7 mg/dL (ref 0.3–1.2)
Total Protein: 7.2 g/dL (ref 6.0–8.3)

## 2012-10-29 LAB — URINALYSIS, ROUTINE W REFLEX MICROSCOPIC
Bilirubin Urine: NEGATIVE
Glucose, UA: NEGATIVE mg/dL
Hgb urine dipstick: NEGATIVE
Ketones, ur: 15 mg/dL — AB
Leukocytes, UA: NEGATIVE
Nitrite: NEGATIVE
Protein, ur: 100 mg/dL — AB
Specific Gravity, Urine: 1.012 (ref 1.005–1.030)
Urobilinogen, UA: 0.2 mg/dL (ref 0.0–1.0)
pH: 8.5 — ABNORMAL HIGH (ref 5.0–8.0)

## 2012-10-29 LAB — URINE MICROSCOPIC-ADD ON

## 2012-10-29 LAB — LIPASE, BLOOD: Lipase: 23 U/L (ref 11–59)

## 2012-10-29 MED ORDER — ONDANSETRON HCL 4 MG/2ML IJ SOLN
4.0000 mg | Freq: Once | INTRAMUSCULAR | Status: DC
  Filled 2012-10-29: qty 2

## 2012-10-29 MED ORDER — MORPHINE SULFATE 4 MG/ML IJ SOLN
4.0000 mg | Freq: Once | INTRAMUSCULAR | Status: AC
  Administered 2012-10-29: 4 mg via INTRAMUSCULAR
  Filled 2012-10-29: qty 1

## 2012-10-29 MED ORDER — MORPHINE SULFATE 4 MG/ML IJ SOLN: 4.0000 mg | Freq: Once | INTRAMUSCULAR | Status: DC

## 2012-10-29 MED ORDER — ONDANSETRON 4 MG PO TBDP
4.0000 mg | ORAL_TABLET | Freq: Once | ORAL | Status: AC
  Administered 2012-10-29: 4 mg via ORAL
  Filled 2012-10-29: qty 1

## 2012-10-29 MED ORDER — SODIUM CHLORIDE 0.9 % IV SOLN: INTRAVENOUS | Status: DC

## 2012-10-29 MED ORDER — HYDROCODONE-ACETAMINOPHEN 5-325 MG PO TABS: 1.0000 | ORAL_TABLET | ORAL | Status: DC | PRN

## 2012-10-29 MED ORDER — FENTANYL CITRATE 0.05 MG/ML IJ SOLN
100.0000 ug | Freq: Once | INTRAMUSCULAR | Status: AC
  Administered 2012-10-29: 100 ug via INTRAMUSCULAR
  Filled 2012-10-29: qty 2

## 2012-10-29 NOTE — ED Provider Notes (Signed)
Medical screening examination/treatment/procedure(s) were performed by non-physician practitioner and as supervising physician I was immediately available for consultation/collaboration.   Blanchie Dessert, MD 10/29/12 2303

## 2012-10-29 NOTE — ED Notes (Signed)
IV team at bedside 

## 2012-10-29 NOTE — ED Notes (Signed)
Pt reports hx of renal transplant. Reports n/v and fever since yesterday, feels dehydrated. Pt appears very anxious at triage.

## 2012-10-29 NOTE — ED Provider Notes (Signed)
CSN: [id number]     Arrival date & time 10/29/12  1414 History   First MD Initiated Contact with Patient 10/29/12 1522     Chief Complaint  Patient presents with  . Emesis  . Fever   (Consider location/radiation/quality/duration/timing/severity/associated sxs/prior Treatment) HPI  Tina Watkins is a 47 y.o.female with a significant PMH of ESRD, kidney transplant, and gastroparesis  presents to the ER with complaints of abdominal pain, low grade fever < 99.9 and emesis. He abdominal pain started yesterday with the vomiting. The pain is generalized and severe. She denies it radiating. She has a history of gastroparesis and tells me this feels the same as that kind of pain. She is concerned that she is dehydrated. She has tried medications at home but they have not helped. She denies having any diarrhea, chest pain, SOB, lower extremity swelling or weakness.   Past Medical History  Diagnosis Date  . Gastroparesis   . Gout   . Hypotension   . Herpes genitalia 1994  . E coli bacteremia 06/18/2011  . Bacteremia due to Gram-negative bacteria 05/23/2011  . History of blood transfusion     "more than a few times" (12/11/2011)  . History of stomach ulcers   . Stroke      left basal ganglia lacunar infarct; Right frontal lobe lacunar infarct.  . Stroke ~ 1999; 2001    "briefly lost my vision; lost my right eye" (12/11/2011)  . Chronic lower back pain   . Depression   . Osteopenia   . Headache(784.0)     "not often anymore" (12/11/2011)  . Complication of anesthesia     "woke up during OR; I have an extremely high tolerance" (12/11/2011)  . Dysrhythmia     "tachycardia" (12/11/2011)  . ESRD (end stage renal disease) 06/12/2011  . Iron deficiency anemia   . Seizures 1994    "post transplant; only have had that one" (12/11/2011)  . GERD (gastroesophageal reflux disease)    Past Surgical History  Procedure Laterality Date  . Kidney transplant  1994; 1999; 2005    "right"  . Appendectomy   ~ 2004  . 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 Total nephrectomy  1988?; 1994; 2005  . Multiple tooth extractions    . Enucleation  2001    "right"  . Esophagogastroduodenoscopy (egd) with propofol N/A 04/21/2012    Procedure: ESOPHAGOGASTRODUODENOSCOPY (EGD) WITH PROPOFOL;  Surgeon: Milus Banister, MD;  Location: [facility];  Service: Endoscopy;  Laterality: N/A;   Family History  Problem Relation Age of Onset  . Hypertension Maternal Grandmother    History  Substance Use Topics  . Smoking status: Former Smoker -- 0.50 packs/day for 28 years    Types: Cigarettes    Quit date: 03/05/2011  . Smokeless tobacco: Never Used  . Alcohol Use: Yes     Comment: uses daily   OB History   Grav Para Term Preterm Abortions TAB SAB Ect Mult Living                 Review of Systems  Constitutional: Positive for fever.  Gastrointestinal: Positive for nausea, vomiting and abdominal pain.  All other systems reviewed and are negative.    Allergies  Levofloxacin  Home Medications   Current Outpatient Rx  Name  Route  Sig  Dispense  Refill  . aspirin EC 81 MG tablet  Oral   Take 81 mg by mouth daily.         [name] azaTHIOprine (IMURAN) 50 MG tablet   Oral   Take 100 mg by mouth daily before breakfast.          . calcitRIOL (ROCALTROL) 0.25 MCG capsule   Oral   Take 0.25 mcg by mouth daily before breakfast.          . colchicine 0.6 MG tablet   Oral   Take 0.6 mg by mouth daily as needed (For gout.).         [name] dicyclomine (BENTYL) 20 MG tablet   Oral   Take 20 mg by mouth 2 (two) times daily as needed (for abdominal cramping).         . furosemide (LASIX) 40 MG tablet   Oral   Take 40 mg by mouth every evening.          [name] Hydrocodone-Acetaminophen 7.5-300 MG TABS   Oral   Take 1-2 tablets by mouth every 6 (six) hours as needed (as needed for Pain).   20 each   0   . metoCLOPramide (REGLAN)  10 MG tablet   Oral   Take 1 tablet (10 mg total) by mouth 4 (four) times daily.   120 tablet   0   . omeprazole (PRILOSEC) 40 MG capsule   Oral   Take 40 mg by mouth 2 (two) times daily.          . ondansetron (ZOFRAN-ODT) 4 MG disintegrating tablet   Oral   Take 4 mg by mouth every 4 (four) hours as needed for nausea.         . predniSONE (DELTASONE) 5 MG tablet   Oral   Take 5 mg by mouth daily.         . promethazine (PHENERGAN) 25 MG tablet   Oral   Take 25 mg by mouth every 6 (six) hours as needed for nausea.          [name] tacrolimus (PROGRAF) 1 MG capsule   Oral   Take 3 mg by mouth 2 (two) times daily.          [name] HYDROcodone-acetaminophen (NORCO/VICODIN) 5-325 MG per tablet   Oral   Take 1 tablet by mouth every 4 (four) hours as needed for pain.   15 tablet   0    BP 97/58  Pulse 71  Temp(Src) 98.9 F (37.2 C) (Oral)  Resp 16  SpO2 100% Physical Exam  Nursing note and vitals reviewed. Constitutional: She appears well-developed and well-nourished. She appears distressed.  HENT:  Head: Normocephalic and atraumatic.  Eyes: Pupils are equal, round, and reactive to light.  Neck: Normal range of motion. Neck supple.  Cardiovascular: Normal rate and regular rhythm.   Pulmonary/Chest: Effort normal.  Abdominal: Soft. There is tenderness (diffuse tenderness to abdomen).  Neurological: She is alert.  Skin: Skin is warm and dry.    ED Course  Procedures (including critical care time) Labs Review Labs Reviewed  CBC WITH DIFFERENTIAL - Abnormal; Notable for the following:    WBC 3.2 (*)    RBC 3.10 (*)    Hemoglobin 10.0 (*)    HCT 28.8 (*)    RDW 19.3 (*)    Neutrophils Relative % 80 (*)    Lymphs Abs 0.4 (*)    All other components within normal limits  COMPREHENSIVE METABOLIC PANEL - Abnormal; Notable for the following:    Creatinine, Ser  1.78 (*)    Calcium 10.9 (*)    GFR calc non Af Amer 33 (*)    GFR calc Af Amer 39 (*)    All other  components within normal limits  LIPASE, BLOOD  URINALYSIS, ROUTINE W REFLEX MICROSCOPIC   Imaging Review No results found.  MDM   1. Vomiting    Unable to obtain IV between nurses and IV team. Pts labs came back much improved from her baseline and she says that she doesn't feel nauseated as she was when she first got here after ODT Zofran and IM 4mg  Morphine.  Nurse gave her two cups of fluid which she tolerated without problems. Pt asks that if I control her pain can she go home. She has a gastric emptying test coming up on Wednesday for this problem and is not supposed to take antemitics right now. I gave her 100 mcg Fentanyl IM. Patient feeling so much better she got dressed on her own and happily said she was ready to leave. Her labs are reasuring, her belly exam was benign and she is feeling much better. Will discharge at this time.  47 y.o.Trula Ore Hollern's evaluation in the Emergency Department is complete. It has been determined that no acute conditions requiring further emergency intervention are present at this time. The patient/guardian have been advised of the diagnosis and plan. We have discussed signs and symptoms that warrant return to the ED, such as changes or worsening in symptoms.  Vital signs are stable at discharge. Filed Vitals:   10/29/12 1830  BP: 101/58  Pulse: 57  Temp:   Resp:     Patient/guardian has voiced understanding and agreed to follow-up with the PCP or specialist.     Linus Mako, PA-C 10/29/12 1859

## 2012-10-31 ENCOUNTER — Encounter (HOSPITAL_COMMUNITY): Payer: Medicaid Other

## 2012-11-04 ENCOUNTER — Encounter (HOSPITAL_COMMUNITY)
Admission: RE | Admit: 2012-11-04 | Discharge: 2012-11-04 | Disposition: A | Discharge status: Home / Self Care | Payer: Medicaid Other | Source: Ambulatory Visit | Attending: Nephrology | Admitting: Nephrology

## 2012-11-04 DIAGNOSIS — N183 Chronic kidney disease, stage 3 unspecified: Secondary | ICD-10-CM | POA: Insufficient documentation

## 2012-11-04 DIAGNOSIS — D638 Anemia in other chronic diseases classified elsewhere: Secondary | ICD-10-CM | POA: Insufficient documentation

## 2012-11-04 LAB — IRON AND TIBC
Iron: 49 ug/dL (ref 42–135)
Saturation Ratios: 26 % (ref 20–55)
TIBC: 186 ug/dL — ABNORMAL LOW (ref 250–470)
UIBC: 137 ug/dL (ref 125–400)

## 2012-11-04 LAB — RENAL FUNCTION PANEL
Albumin: 3.9 g/dL (ref 3.5–5.2)
BUN: 30 mg/dL — ABNORMAL HIGH (ref 6–23)
CO2: 23 mEq/L (ref 19–32)
Calcium: 9.6 mg/dL (ref 8.4–10.5)
Chloride: 102 mEq/L (ref 96–112)
Creatinine, Ser: 2.64 mg/dL — ABNORMAL HIGH (ref 0.50–1.10)
GFR calc Af Amer: 24 mL/min — ABNORMAL LOW (ref >90)
GFR calc non Af Amer: 21 mL/min — ABNORMAL LOW (ref >90)
Glucose, Bld: 88 mg/dL (ref 70–99)
Phosphorus: 2.8 mg/dL (ref 2.3–4.6)
Potassium: 3.6 mEq/L (ref 3.5–5.1)
Sodium: 137 mEq/L (ref 135–145)

## 2012-11-04 LAB — FERRITIN: Ferritin: 1159 ng/mL — ABNORMAL HIGH (ref 10–291)

## 2012-11-04 LAB — POCT HEMOGLOBIN-HEMACUE: Hemoglobin: 9.8 g/dL — ABNORMAL LOW (ref 12.0–15.0)

## 2012-11-04 MED ORDER — EPOETIN ALFA 20000 UNIT/ML IJ SOLN
INTRAMUSCULAR | Status: AC
  Administered 2012-11-04: 20000 [IU] via SUBCUTANEOUS
  Filled 2012-11-04: qty 1

## 2012-11-04 MED ORDER — EPOETIN ALFA 20000 UNIT/ML IJ SOLN: 30000.0000 [IU] | INTRAMUSCULAR | Status: DC

## 2012-11-04 MED ORDER — EPOETIN ALFA 10000 UNIT/ML IJ SOLN
INTRAMUSCULAR | Status: AC
  Administered 2012-11-04: 10000 [IU] via SUBCUTANEOUS
  Filled 2012-11-04: qty 1

## 2012-11-10 ENCOUNTER — Ambulatory Visit: Payer: Medicaid Other | Attending: Orthopedic Surgery

## 2012-11-10 DIAGNOSIS — M542 Cervicalgia: Secondary | ICD-10-CM | POA: Diagnosis not present

## 2012-11-10 DIAGNOSIS — IMO0001 Reserved for inherently not codable concepts without codable children: Secondary | ICD-10-CM | POA: Insufficient documentation

## 2012-11-10 DIAGNOSIS — M25519 Pain in unspecified shoulder: Secondary | ICD-10-CM | POA: Insufficient documentation

## 2012-11-14 ENCOUNTER — Ambulatory Visit: Payer: Medicaid Other | Admitting: Physical Therapy

## 2012-11-14 DIAGNOSIS — IMO0001 Reserved for inherently not codable concepts without codable children: Secondary | ICD-10-CM | POA: Diagnosis not present

## 2012-11-16 ENCOUNTER — Ambulatory Visit: Payer: Medicaid Other | Admitting: Rehabilitation

## 2012-11-16 DIAGNOSIS — IMO0001 Reserved for inherently not codable concepts without codable children: Secondary | ICD-10-CM | POA: Diagnosis not present

## 2012-11-17 ENCOUNTER — Ambulatory Visit: Payer: Medicaid Other | Admitting: Physical Therapy

## 2012-11-17 DIAGNOSIS — IMO0001 Reserved for inherently not codable concepts without codable children: Secondary | ICD-10-CM | POA: Diagnosis not present

## 2012-11-18 ENCOUNTER — Encounter (HOSPITAL_COMMUNITY)
Admission: RE | Admit: 2012-11-18 | Discharge: 2012-11-18 | Disposition: A | Discharge status: Home / Self Care | Payer: Medicaid Other | Source: Ambulatory Visit | Attending: Nephrology | Admitting: Nephrology

## 2012-11-18 LAB — POCT HEMOGLOBIN-HEMACUE: Hemoglobin: 8.5 g/dL — ABNORMAL LOW (ref 12.0–15.0)

## 2012-11-18 MED ORDER — EPOETIN ALFA 20000 UNIT/ML IJ SOLN
30000.0000 [IU] | INTRAMUSCULAR | Status: DC
  Administered 2012-11-18: 30000 [IU] via SUBCUTANEOUS

## 2012-11-18 MED ORDER — EPOETIN ALFA 10000 UNIT/ML IJ SOLN
INTRAMUSCULAR | Status: AC
  Filled 2012-11-18: qty 1

## 2012-11-21 ENCOUNTER — Ambulatory Visit: Payer: Medicaid Other | Admitting: Physical Therapy

## 2012-11-22 ENCOUNTER — Ambulatory Visit: Payer: Medicaid Other | Admitting: Rehabilitation

## 2012-11-24 ENCOUNTER — Ambulatory Visit: Payer: Medicaid Other | Admitting: Physical Therapy

## 2012-11-29 ENCOUNTER — Ambulatory Visit: Payer: Medicaid Other | Admitting: Rehabilitation

## 2012-11-29 DIAGNOSIS — IMO0001 Reserved for inherently not codable concepts without codable children: Secondary | ICD-10-CM | POA: Diagnosis not present

## 2012-12-01 ENCOUNTER — Ambulatory Visit: Payer: Medicaid Other | Admitting: Physical Therapy

## 2012-12-01 DIAGNOSIS — IMO0001 Reserved for inherently not codable concepts without codable children: Secondary | ICD-10-CM | POA: Diagnosis not present

## 2012-12-06 ENCOUNTER — Ambulatory Visit: Payer: Medicaid Other | Attending: Orthopedic Surgery | Admitting: Rehabilitation

## 2012-12-06 DIAGNOSIS — IMO0001 Reserved for inherently not codable concepts without codable children: Secondary | ICD-10-CM | POA: Diagnosis not present

## 2012-12-06 DIAGNOSIS — M542 Cervicalgia: Secondary | ICD-10-CM | POA: Insufficient documentation

## 2012-12-06 DIAGNOSIS — M25519 Pain in unspecified shoulder: Secondary | ICD-10-CM | POA: Diagnosis not present

## 2012-12-07 ENCOUNTER — Encounter: Payer: Medicaid Other | Admitting: Rehabilitation

## 2012-12-08 ENCOUNTER — Ambulatory Visit: Payer: Medicaid Other | Admitting: Rehabilitation

## 2012-12-08 ENCOUNTER — Other Ambulatory Visit: Payer: Self-pay

## 2012-12-08 DIAGNOSIS — IMO0001 Reserved for inherently not codable concepts without codable children: Secondary | ICD-10-CM | POA: Diagnosis not present

## 2012-12-09 ENCOUNTER — Encounter (HOSPITAL_COMMUNITY)
Admission: RE | Admit: 2012-12-09 | Discharge: 2012-12-09 | Disposition: A | Discharge status: Home / Self Care | Payer: Medicaid Other | Source: Ambulatory Visit | Attending: Nephrology | Admitting: Nephrology

## 2012-12-09 DIAGNOSIS — N183 Chronic kidney disease, stage 3 unspecified: Secondary | ICD-10-CM | POA: Insufficient documentation

## 2012-12-09 DIAGNOSIS — D638 Anemia in other chronic diseases classified elsewhere: Secondary | ICD-10-CM | POA: Insufficient documentation

## 2012-12-09 LAB — RENAL FUNCTION PANEL
Albumin: 4 g/dL (ref 3.5–5.2)
BUN: 23 mg/dL (ref 6–23)
CO2: 23 mEq/L (ref 19–32)
Calcium: 9.6 mg/dL (ref 8.4–10.5)
Chloride: 102 mEq/L (ref 96–112)
Creatinine, Ser: 2.33 mg/dL — ABNORMAL HIGH (ref 0.50–1.10)
GFR calc Af Amer: 28 mL/min — ABNORMAL LOW (ref >90)
GFR calc non Af Amer: 24 mL/min — ABNORMAL LOW (ref >90)
Glucose, Bld: 83 mg/dL (ref 70–99)
Phosphorus: 2.9 mg/dL (ref 2.3–4.6)
Potassium: 4 mEq/L (ref 3.5–5.1)
Sodium: 136 mEq/L (ref 135–145)

## 2012-12-09 LAB — POCT HEMOGLOBIN-HEMACUE: Hemoglobin: 9 g/dL — ABNORMAL LOW (ref 12.0–15.0)

## 2012-12-09 MED ORDER — EPOETIN ALFA 10000 UNIT/ML IJ SOLN
INTRAMUSCULAR | Status: AC
  Administered 2012-12-09: 10000 [IU] via SUBCUTANEOUS
  Filled 2012-12-09: qty 1

## 2012-12-09 MED ORDER — EPOETIN ALFA 20000 UNIT/ML IJ SOLN: 30000.0000 [IU] | INTRAMUSCULAR | Status: DC

## 2012-12-09 MED ORDER — EPOETIN ALFA 20000 UNIT/ML IJ SOLN
INTRAMUSCULAR | Status: AC
  Administered 2012-12-09: 20000 [IU] via SUBCUTANEOUS
  Filled 2012-12-09: qty 1

## 2012-12-12 ENCOUNTER — Ambulatory Visit: Payer: Medicaid Other | Admitting: Physical Therapy

## 2012-12-14 ENCOUNTER — Encounter: Payer: Medicaid Other | Admitting: Rehabilitation

## 2012-12-15 ENCOUNTER — Encounter: Payer: Medicaid Other | Admitting: Physical Therapy

## 2012-12-22 ENCOUNTER — Other Ambulatory Visit (HOSPITAL_COMMUNITY): Payer: Self-pay | Admitting: *Deleted

## 2012-12-23 ENCOUNTER — Encounter (HOSPITAL_COMMUNITY)
Admission: RE | Admit: 2012-12-23 | Discharge: 2012-12-23 | Disposition: A | Discharge status: Home / Self Care | Payer: Medicaid Other | Source: Ambulatory Visit | Attending: Nephrology | Admitting: Nephrology

## 2012-12-23 LAB — POCT HEMOGLOBIN-HEMACUE: Hemoglobin: 9 g/dL — ABNORMAL LOW (ref 12.0–15.0)

## 2012-12-23 MED ORDER — EPOETIN ALFA 40000 UNIT/ML IJ SOLN
INTRAMUSCULAR | Status: AC
  Administered 2012-12-23: 40000 [IU] via SUBCUTANEOUS
  Filled 2012-12-23: qty 1

## 2012-12-23 MED ORDER — EPOETIN ALFA 20000 UNIT/ML IJ SOLN: 40000.0000 [IU] | INTRAMUSCULAR | Status: DC

## 2013-01-06 ENCOUNTER — Encounter (HOSPITAL_COMMUNITY)
Admission: RE | Admit: 2013-01-06 | Discharge: 2013-01-06 | Disposition: A | Discharge status: Home / Self Care | Payer: Medicaid Other | Source: Ambulatory Visit | Attending: Nephrology | Admitting: Nephrology

## 2013-01-06 DIAGNOSIS — D638 Anemia in other chronic diseases classified elsewhere: Secondary | ICD-10-CM | POA: Insufficient documentation

## 2013-01-06 DIAGNOSIS — N183 Chronic kidney disease, stage 3 unspecified: Secondary | ICD-10-CM | POA: Insufficient documentation

## 2013-01-06 LAB — RENAL FUNCTION PANEL
Albumin: 3.9 g/dL (ref 3.5–5.2)
BUN: 34 mg/dL — ABNORMAL HIGH (ref 6–23)
CO2: 24 mEq/L (ref 19–32)
Calcium: 9.9 mg/dL (ref 8.4–10.5)
Chloride: 100 mEq/L (ref 96–112)
Creatinine, Ser: 2.04 mg/dL — ABNORMAL HIGH (ref 0.50–1.10)
GFR calc Af Amer: 33 mL/min — ABNORMAL LOW (ref >90)
GFR calc non Af Amer: 28 mL/min — ABNORMAL LOW (ref >90)
Glucose, Bld: 92 mg/dL (ref 70–99)
Phosphorus: 3.3 mg/dL (ref 2.3–4.6)
Potassium: 3.7 mEq/L (ref 3.5–5.1)
Sodium: 137 mEq/L (ref 135–145)

## 2013-01-06 LAB — FERRITIN: Ferritin: 949 ng/mL — ABNORMAL HIGH (ref 10–291)

## 2013-01-06 LAB — IRON AND TIBC
Iron: 78 ug/dL (ref 42–135)
Saturation Ratios: 37 % (ref 20–55)
TIBC: 209 ug/dL — ABNORMAL LOW (ref 250–470)
UIBC: 131 ug/dL (ref 125–400)

## 2013-01-06 LAB — POCT HEMOGLOBIN-HEMACUE: Hemoglobin: 9.5 g/dL — ABNORMAL LOW (ref 12.0–15.0)

## 2013-01-06 MED ORDER — EPOETIN ALFA 40000 UNIT/ML IJ SOLN
INTRAMUSCULAR | Status: AC
  Administered 2013-01-06: 40000 [IU] via SUBCUTANEOUS
  Filled 2013-01-06: qty 1

## 2013-01-06 MED ORDER — EPOETIN ALFA 20000 UNIT/ML IJ SOLN: 40000.0000 [IU] | INTRAMUSCULAR | Status: DC

## 2013-02-03 ENCOUNTER — Encounter (HOSPITAL_COMMUNITY)
Admission: RE | Admit: 2013-02-03 | Discharge: 2013-02-03 | Disposition: A | Discharge status: Home / Self Care | Payer: Medicaid Other | Source: Ambulatory Visit | Attending: Nephrology | Admitting: Nephrology

## 2013-02-03 DIAGNOSIS — N183 Chronic kidney disease, stage 3 unspecified: Secondary | ICD-10-CM | POA: Insufficient documentation

## 2013-02-03 DIAGNOSIS — D638 Anemia in other chronic diseases classified elsewhere: Secondary | ICD-10-CM | POA: Insufficient documentation

## 2013-02-03 LAB — RENAL FUNCTION PANEL
Albumin: 3.9 g/dL (ref 3.5–5.2)
BUN: 32 mg/dL — ABNORMAL HIGH (ref 6–23)
CO2: 23 mEq/L (ref 19–32)
Calcium: 9.4 mg/dL (ref 8.4–10.5)
Chloride: 103 mEq/L (ref 96–112)
Creatinine, Ser: 2.44 mg/dL — ABNORMAL HIGH (ref 0.50–1.10)
GFR calc Af Amer: 26 mL/min — ABNORMAL LOW (ref >90)
GFR calc non Af Amer: 23 mL/min — ABNORMAL LOW (ref >90)
Glucose, Bld: 91 mg/dL (ref 70–99)
Phosphorus: 2.8 mg/dL (ref 2.3–4.6)
Potassium: 4 mEq/L (ref 3.7–5.3)
Sodium: 139 mEq/L (ref 137–147)

## 2013-02-03 LAB — POCT HEMOGLOBIN-HEMACUE: Hemoglobin: 9.3 g/dL — ABNORMAL LOW (ref 12.0–15.0)

## 2013-02-03 MED ORDER — EPOETIN ALFA 20000 UNIT/ML IJ SOLN
40000.0000 [IU] | INTRAMUSCULAR | Status: DC
  Administered 2013-02-03: 40000 [IU] via SUBCUTANEOUS

## 2013-02-03 MED ORDER — EPOETIN ALFA 40000 UNIT/ML IJ SOLN
INTRAMUSCULAR | Status: AC
  Filled 2013-02-03: qty 1

## 2013-02-06 MED FILL — Epoetin Alfa Inj 40000 Unit/ML: INTRAMUSCULAR | Qty: 1 | Status: AC

## 2013-02-15 ENCOUNTER — Other Ambulatory Visit (HOSPITAL_COMMUNITY): Payer: Self-pay | Admitting: *Deleted

## 2013-02-16 ENCOUNTER — Encounter (HOSPITAL_COMMUNITY)
Admission: RE | Admit: 2013-02-16 | Discharge: 2013-02-16 | Disposition: A | Discharge status: Home / Self Care | Payer: Medicaid Other | Source: Ambulatory Visit | Attending: Nephrology | Admitting: Nephrology

## 2013-02-16 LAB — POCT HEMOGLOBIN-HEMACUE: Hemoglobin: 10.7 g/dL — ABNORMAL LOW (ref 12.0–15.0)

## 2013-02-16 MED ORDER — EPOETIN ALFA 20000 UNIT/ML IJ SOLN: 40000.0000 [IU] | INTRAMUSCULAR | Status: DC

## 2013-02-16 MED ORDER — EPOETIN ALFA 40000 UNIT/ML IJ SOLN
INTRAMUSCULAR | Status: AC
  Administered 2013-02-16: 40000 [IU]
  Filled 2013-02-16: qty 1

## 2013-02-17 ENCOUNTER — Encounter (HOSPITAL_COMMUNITY): Payer: Medicaid Other

## 2013-03-02 ENCOUNTER — Encounter (HOSPITAL_COMMUNITY): Payer: Medicaid Other

## 2013-03-10 ENCOUNTER — Encounter (HOSPITAL_COMMUNITY)
Admission: RE | Admit: 2013-03-10 | Discharge: 2013-03-10 | Disposition: A | Discharge status: Home / Self Care | Payer: Medicaid Other | Source: Ambulatory Visit | Attending: Nephrology | Admitting: Nephrology

## 2013-03-10 DIAGNOSIS — N183 Chronic kidney disease, stage 3 unspecified: Secondary | ICD-10-CM | POA: Insufficient documentation

## 2013-03-10 DIAGNOSIS — D638 Anemia in other chronic diseases classified elsewhere: Secondary | ICD-10-CM | POA: Insufficient documentation

## 2013-03-10 LAB — RENAL FUNCTION PANEL
Albumin: 4.1 g/dL (ref 3.5–5.2)
BUN: 27 mg/dL — ABNORMAL HIGH (ref 6–23)
CO2: 26 mEq/L (ref 19–32)
Calcium: 9.5 mg/dL (ref 8.4–10.5)
Chloride: 102 mEq/L (ref 96–112)
Creatinine, Ser: 2.36 mg/dL — ABNORMAL HIGH (ref 0.50–1.10)
GFR calc Af Amer: 27 mL/min — ABNORMAL LOW (ref >90)
GFR calc non Af Amer: 24 mL/min — ABNORMAL LOW (ref >90)
Glucose, Bld: 80 mg/dL (ref 70–99)
Phosphorus: 2.9 mg/dL (ref 2.3–4.6)
Potassium: 3.9 mEq/L (ref 3.7–5.3)
Sodium: 142 mEq/L (ref 137–147)

## 2013-03-10 LAB — IRON AND TIBC
Iron: 64 ug/dL (ref 42–135)
Saturation Ratios: 31 % (ref 20–55)
TIBC: 205 ug/dL — ABNORMAL LOW (ref 250–470)
UIBC: 141 ug/dL (ref 125–400)

## 2013-03-10 LAB — FERRITIN: Ferritin: 1108 ng/mL — ABNORMAL HIGH (ref 10–291)

## 2013-03-10 LAB — POCT HEMOGLOBIN-HEMACUE: Hemoglobin: 10 g/dL — ABNORMAL LOW (ref 12.0–15.0)

## 2013-03-10 MED ORDER — EPOETIN ALFA 20000 UNIT/ML IJ SOLN: 40000.0000 [IU] | INTRAMUSCULAR | Status: DC

## 2013-03-10 MED ORDER — EPOETIN ALFA 40000 UNIT/ML IJ SOLN
INTRAMUSCULAR | Status: AC
  Administered 2013-03-10: 40000 [IU] via SUBCUTANEOUS
  Filled 2013-03-10: qty 1

## 2013-03-13 ENCOUNTER — Other Ambulatory Visit: Payer: Self-pay | Admitting: Emergency Medicine

## 2013-03-23 ENCOUNTER — Other Ambulatory Visit (HOSPITAL_COMMUNITY): Payer: Self-pay | Admitting: *Deleted

## 2013-03-24 ENCOUNTER — Encounter (HOSPITAL_COMMUNITY)
Admission: RE | Admit: 2013-03-24 | Discharge: 2013-03-24 | Disposition: A | Discharge status: Home / Self Care | Payer: Medicaid Other | Source: Ambulatory Visit | Attending: Nephrology | Admitting: Nephrology

## 2013-03-24 LAB — POCT HEMOGLOBIN-HEMACUE: Hemoglobin: 11.2 g/dL — ABNORMAL LOW (ref 12.0–15.0)

## 2013-03-24 MED ORDER — EPOETIN ALFA 40000 UNIT/ML IJ SOLN
INTRAMUSCULAR | Status: AC
  Administered 2013-03-24: 40000 [IU] via SUBCUTANEOUS
  Filled 2013-03-24: qty 1

## 2013-03-24 MED ORDER — EPOETIN ALFA 20000 UNIT/ML IJ SOLN: 40000.0000 [IU] | INTRAMUSCULAR | Status: DC

## 2013-03-27 ENCOUNTER — Emergency Department (HOSPITAL_COMMUNITY)
Admission: EM | Admit: 2013-03-27 | Discharge: 2013-03-27 | Disposition: A | Discharge status: Home / Self Care | Payer: Medicaid Other | Attending: Emergency Medicine | Admitting: Emergency Medicine

## 2013-03-27 ENCOUNTER — Encounter (HOSPITAL_COMMUNITY): Payer: Self-pay | Admitting: Emergency Medicine

## 2013-03-27 DIAGNOSIS — Z8673 Personal history of transient ischemic attack (TIA), and cerebral infarction without residual deficits: Secondary | ICD-10-CM | POA: Insufficient documentation

## 2013-03-27 DIAGNOSIS — Z9089 Acquired absence of other organs: Secondary | ICD-10-CM | POA: Insufficient documentation

## 2013-03-27 DIAGNOSIS — Z79899 Other long term (current) drug therapy: Secondary | ICD-10-CM | POA: Insufficient documentation

## 2013-03-27 DIAGNOSIS — IMO0002 Reserved for concepts with insufficient information to code with codable children: Secondary | ICD-10-CM | POA: Insufficient documentation

## 2013-03-27 DIAGNOSIS — Z8669 Personal history of other diseases of the nervous system and sense organs: Secondary | ICD-10-CM | POA: Insufficient documentation

## 2013-03-27 DIAGNOSIS — M109 Gout, unspecified: Secondary | ICD-10-CM | POA: Insufficient documentation

## 2013-03-27 DIAGNOSIS — Z992 Dependence on renal dialysis: Secondary | ICD-10-CM | POA: Insufficient documentation

## 2013-03-27 DIAGNOSIS — Z3202 Encounter for pregnancy test, result negative: Secondary | ICD-10-CM | POA: Insufficient documentation

## 2013-03-27 DIAGNOSIS — G8929 Other chronic pain: Secondary | ICD-10-CM | POA: Insufficient documentation

## 2013-03-27 DIAGNOSIS — K219 Gastro-esophageal reflux disease without esophagitis: Secondary | ICD-10-CM | POA: Insufficient documentation

## 2013-03-27 DIAGNOSIS — K3184 Gastroparesis: Secondary | ICD-10-CM

## 2013-03-27 DIAGNOSIS — Z8619 Personal history of other infectious and parasitic diseases: Secondary | ICD-10-CM | POA: Insufficient documentation

## 2013-03-27 DIAGNOSIS — R112 Nausea with vomiting, unspecified: Secondary | ICD-10-CM

## 2013-03-27 DIAGNOSIS — Z87891 Personal history of nicotine dependence: Secondary | ICD-10-CM | POA: Insufficient documentation

## 2013-03-27 DIAGNOSIS — Z862 Personal history of diseases of the blood and blood-forming organs and certain disorders involving the immune mechanism: Secondary | ICD-10-CM | POA: Insufficient documentation

## 2013-03-27 DIAGNOSIS — N186 End stage renal disease: Secondary | ICD-10-CM | POA: Insufficient documentation

## 2013-03-27 DIAGNOSIS — Z8739 Personal history of other diseases of the musculoskeletal system and connective tissue: Secondary | ICD-10-CM | POA: Insufficient documentation

## 2013-03-27 DIAGNOSIS — Z7982 Long term (current) use of aspirin: Secondary | ICD-10-CM | POA: Insufficient documentation

## 2013-03-27 DIAGNOSIS — Z8659 Personal history of other mental and behavioral disorders: Secondary | ICD-10-CM | POA: Insufficient documentation

## 2013-03-27 DIAGNOSIS — Z940 Kidney transplant status: Secondary | ICD-10-CM | POA: Insufficient documentation

## 2013-03-27 LAB — I-STAT CHEM 8, ED
BUN: 23 mg/dL (ref 6–23)
Calcium, Ion: 1.24 mmol/L — ABNORMAL HIGH (ref 1.12–1.23)
Chloride: 105 mEq/L (ref 96–112)
Creatinine, Ser: 1.8 mg/dL — ABNORMAL HIGH (ref 0.50–1.10)
Glucose, Bld: 77 mg/dL (ref 70–99)
HCT: 31 % — ABNORMAL LOW (ref 36.0–46.0)
Hemoglobin: 10.5 g/dL — ABNORMAL LOW (ref 12.0–15.0)
Potassium: 3.8 mEq/L (ref 3.7–5.3)
Sodium: 142 mEq/L (ref 137–147)
TCO2: 23 mmol/L (ref 0–100)

## 2013-03-27 LAB — URINE MICROSCOPIC-ADD ON

## 2013-03-27 LAB — CBC WITH DIFFERENTIAL/PLATELET
Basophils Absolute: 0 10*3/uL (ref 0.0–0.1)
Basophils Relative: 0 % (ref 0–1)
Eosinophils Absolute: 0 10*3/uL (ref 0.0–0.7)
Eosinophils Relative: 0 % (ref 0–5)
HCT: 30.8 % — ABNORMAL LOW (ref 36.0–46.0)
Hemoglobin: 10.5 g/dL — ABNORMAL LOW (ref 12.0–15.0)
Lymphocytes Relative: 12 % (ref 12–46)
Lymphs Abs: 0.6 10*3/uL — ABNORMAL LOW (ref 0.7–4.0)
MCH: 32.9 pg (ref 26.0–34.0)
MCHC: 34.1 g/dL (ref 30.0–36.0)
MCV: 96.6 fL (ref 78.0–100.0)
Monocytes Absolute: 0.4 10*3/uL (ref 0.1–1.0)
Monocytes Relative: 8 % (ref 3–12)
Neutro Abs: 4.2 10*3/uL (ref 1.7–7.7)
Neutrophils Relative %: 80 % — ABNORMAL HIGH (ref 43–77)
Platelets: 191 10*3/uL (ref 150–400)
RBC: 3.19 MIL/uL — ABNORMAL LOW (ref 3.87–5.11)
RDW: 20 % — ABNORMAL HIGH (ref 11.5–15.5)
WBC: 5.3 10*3/uL (ref 4.0–10.5)

## 2013-03-27 LAB — URINALYSIS, ROUTINE W REFLEX MICROSCOPIC
Bilirubin Urine: NEGATIVE
Glucose, UA: NEGATIVE mg/dL
Hgb urine dipstick: NEGATIVE
Ketones, ur: 40 mg/dL — AB
Leukocytes, UA: NEGATIVE
Nitrite: NEGATIVE
Protein, ur: 100 mg/dL — AB
Specific Gravity, Urine: 1.017 (ref 1.005–1.030)
Urobilinogen, UA: 0.2 mg/dL (ref 0.0–1.0)
pH: 7 (ref 5.0–8.0)

## 2013-03-27 LAB — LIPASE, BLOOD: Lipase: 27 U/L (ref 11–59)

## 2013-03-27 LAB — POC URINE PREG, ED: Preg Test, Ur: NEGATIVE

## 2013-03-27 MED ORDER — OXYCODONE-ACETAMINOPHEN 10-325 MG PO TABS: 1.0000 | ORAL_TABLET | Freq: Four times a day (QID) | ORAL | Status: DC | PRN

## 2013-03-27 MED ORDER — METOCLOPRAMIDE HCL 5 MG/ML IJ SOLN
10.0000 mg | Freq: Once | INTRAMUSCULAR | Status: AC
  Administered 2013-03-27: 10 mg via INTRAVENOUS
  Filled 2013-03-27: qty 2

## 2013-03-27 MED ORDER — HYDROCODONE-ACETAMINOPHEN 5-325 MG PO TABS: 1.0000 | ORAL_TABLET | Freq: Four times a day (QID) | ORAL | Status: DC | PRN

## 2013-03-27 MED ORDER — HYDROMORPHONE HCL PF 1 MG/ML IJ SOLN
1.0000 mg | Freq: Once | INTRAMUSCULAR | Status: AC
  Administered 2013-03-27: 1 mg via INTRAVENOUS
  Filled 2013-03-27: qty 1

## 2013-03-27 MED ORDER — SODIUM CHLORIDE 0.9 % IV BOLUS (SEPSIS)
1000.0000 mL | Freq: Once | INTRAVENOUS | Status: AC
  Administered 2013-03-27: 1000 mL via INTRAVENOUS

## 2013-03-27 NOTE — ED Notes (Signed)
Pt states that "normally I need 2 mg Dilaudid-- I am not feeling sleepy not I normally do" Pt is more comfortable, but still c/o abd pain 8/10.

## 2013-03-27 NOTE — ED Notes (Signed)
Patient states she has "been sick" since Saturday night with abdominal pain, N/V.   Patient states mostly has had dry heaves.   Patient states is s/p kidney transplant in 2005.

## 2013-03-27 NOTE — ED Notes (Signed)
Pt more comfortable, drinking gingerale, wanting to know when she can go home. Pleasant, no further nausea

## 2013-03-27 NOTE — Discharge Instructions (Signed)
Gastroparesis  Gastroparesis is also called slowed stomach emptying (delayed gastric emptying). It is a condition in which the stomach takes too long to empty its contents. It often happens in people with diabetes.  CAUSES  Gastroparesis happens when nerves to the stomach are damaged or stop working. When the nerves are damaged, the muscles of the stomach and intestines do not work normally. The movement of food is slowed or stopped. High blood glucose (sugar) causes changes in nerves and can damage the blood vessels that carry oxygen and nutrients to the nerves. RISK FACTORS  Diabetes.  Post-viral syndromes.  Eating disorders (anorexia, bulimia).  Surgery on the stomach or vagus nerve.  Gastroesophageal reflux disease (rarely).  Smooth muscle disorders (amyloidosis, scleroderma).  Metabolic disorders, including hypothyroidism.  Parkinson disease. SYMPTOMS   Heartburn.  Feeling sick to your stomach (nausea).  Vomiting of undigested food.  An early feeling of fullness when eating.  Weight loss.  Abdominal bloating.  Erratic blood glucose levels.  Lack of appetite.  Gastroesophageal reflux.  Spasms of the stomach wall. Complications can include:  Bacterial overgrowth in stomach. Food stays in the stomach and can ferment and cause bacteria to grow.  Weight loss due to difficulty digesting and absorbing nutrients.  Vomiting.  Obstruction in the stomach. Undigested food can harden and cause nausea and vomiting.  Blood glucose fluctuations caused by inconsistent food absorption. DIAGNOSIS  The diagnosis of gastroparesis is confirmed through one or more of the following tests:  Barium X-rays and scans. These tests look at how long it takes for food to move through the stomach.  Gastric manometry. This test measures electrical and muscular activity in the stomach. A thin tube is passed down the throat into the stomach. The tube contains a wire that takes measurements  of the stomach's electrical and muscular activity as it digests liquids and solid food.  Endoscopy. This procedure is done with a long, thin tube called an endoscope. It is passed through the mouth and gently down the esophagus into the stomach. This tube helps the caregiver look at the lining of the stomach to check for any abnormalities.  Ultrasonography. This can rule out gallbladder disease or pancreatitis. This test will outline and define the shape of the gallbladder and pancreas. TREATMENT   Treatments may include:  Exercise.  Medicines to control nausea and vomiting.  Medicines to stimulate stomach muscles.  Changes in what and when you eat.  Having smaller meals more often.  Eating low-fiber forms of high-fiber foods, such as eating cooked vegetables instead of raw vegetables.  Eating low-fat foods.  Consuming liquids, which are easier to digest.  In severe cases, feeding tubes and intravenous (IV) feeding may be needed. It is important to note that in most cases, treatment does not cure gastroparesis. It is usually a lasting (chronic) condition. Treatment helps you manage the underlying condition so that you can be as healthy and comfortable as possible. Other treatments  A gastric neurostimulator has been developed to assist people with gastroparesis. The battery-operated device is surgically implanted. It emits mild electrical pulses to help improve stomach emptying and to control nausea and vomiting.  The use of botulinum toxin has been shown to improve stomach emptying by decreasing the prolonged contractions of the muscle between the stomach and the small intestine (pyloric sphincter). The benefits are temporary. SEEK MEDICAL CARE IF:   You have diabetes and you are having problems keeping your blood glucose in goal range.  You are having nausea,   vomiting, bloating, or early feelings of fullness with eating.  Your symptoms do not change with a change in  diet. Document Released: 01/19/2005 Document Revised: 05/16/2012 Document Reviewed: 06/28/2008 [employer] Patient Information 2014 [employer].  

## 2013-03-27 NOTE — ED Provider Notes (Signed)
CSN: [id number]     Arrival date & time 03/27/13  [id number] History   First MD Initiated Contact with Patient 03/27/13 [phone]     Chief Complaint  Patient presents with  . Abdominal Pain  . Emesis  . Nausea   HPI Comments: 48 yo F hx of ESRD s/p renal tx, CVA, gastroparesis presents with CC abdominal pain, n/v.  Symptoms started on Saturday.  She endorses intractable nausea, nonbilious/nonbloody vomiting, and generalized abdominal pain, worse epigastrum, with associated subjective fever, chills, and decrease in oral intake. Denies CP, SOB, diarrhea, dysuria, myalgias, vaginal discharge/bleeding, or any other symptoms.  Pt has been taking home medications for symptomatic control, without much benefit.  Pt states symptoms are similar to previous gastroparesis episodes.  Denies sick contacts.  Denies any other complaints today.  The history is provided by the patient. No language interpreter was used.    Past Medical History  Diagnosis Date  . Gastroparesis   . Gout   . Hypotension   . Herpes genitalia 1994  . E coli bacteremia 06/18/2011  . Bacteremia due to Gram-negative bacteria 05/23/2011  . History of blood transfusion     "more than a few times" (12/11/2011)  . History of stomach ulcers   . Stroke      left basal ganglia lacunar infarct; Right frontal lobe lacunar infarct.  . Stroke ~ 1999; 2001    "briefly lost my vision; lost my right eye" (12/11/2011)  . Chronic lower back pain   . Depression   . Osteopenia   . Headache(784.0)     "not often anymore" (12/11/2011)  . Complication of anesthesia     "woke up during OR; I have an extremely high tolerance" (12/11/2011)  . Dysrhythmia     "tachycardia" (12/11/2011)  . ESRD (end stage renal disease) 06/12/2011  . Iron deficiency anemia   . Seizures 1994    "post transplant; only have had that one" (12/11/2011)  . GERD (gastroesophageal reflux disease)    Past Surgical History  Procedure Laterality Date  . Kidney transplant  1994; 1999;  2005    "right"  . Appendectomy  ~ 2004  . 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 Total nephrectomy  1988?; 1994; 2005  . Multiple tooth extractions    . Enucleation  2001    "right"  . Esophagogastroduodenoscopy (egd) with propofol N/A 04/21/2012    Procedure: ESOPHAGOGASTRODUODENOSCOPY (EGD) WITH PROPOFOL;  Surgeon: Milus Banister, MD;  Location: [facility];  Service: Endoscopy;  Laterality: N/A;   Family History  Problem Relation Age of Onset  . Hypertension Maternal Grandmother    History  Substance Use Topics  . Smoking status: Former Smoker -- 0.50 packs/day for 28 years    Types: Cigarettes    Quit date: 03/05/2011  . Smokeless tobacco: Never Used  . Alcohol Use: Yes     Comment: uses daily   OB History   Grav Para Term Preterm Abortions TAB SAB Ect Mult Living                 Review of Systems  Constitutional: Negative for fever and chills.  Respiratory: Negative for cough and shortness of breath.   Cardiovascular: Negative for chest pain.  Gastrointestinal: Positive for nausea, vomiting and abdominal pain. Negative for diarrhea, constipation and abdominal distention.  Genitourinary: Negative for dysuria, vaginal bleeding and vaginal discharge.  Musculoskeletal: Negative  for myalgias.  Skin: Negative for rash.  Neurological: Negative for dizziness, weakness, light-headedness, numbness and headaches.  Hematological: Negative for adenopathy. Does not bruise/bleed easily.  All other systems reviewed and are negative.      Allergies  Levofloxacin  Home Medications   Current Outpatient Rx  Name  Route  Sig  Dispense  Refill  . aspirin EC 81 MG tablet   Oral   Take 81 mg by mouth daily.         [name] azaTHIOprine (IMURAN) 50 MG tablet   Oral   Take 100 mg by mouth daily before breakfast.          . calcitRIOL (ROCALTROL) 0.25 MCG capsule   Oral   Take 0.25 mcg by mouth daily before  breakfast.          . colchicine 0.6 MG tablet   Oral   Take 0.6 mg by mouth daily as needed (For gout.).         [name] dicyclomine (BENTYL) 20 MG tablet   Oral   Take 20 mg by mouth 2 (two) times daily as needed (for abdominal cramping).         . furosemide (LASIX) 40 MG tablet   Oral   Take 40 mg by mouth every evening.          [name] HYDROcodone-acetaminophen (NORCO/VICODIN) 5-325 MG per tablet   Oral   Take 1 tablet by mouth every 4 (four) hours as needed for pain.   15 tablet   0   . Hydrocodone-Acetaminophen 7.5-300 MG TABS   Oral   Take 1-2 tablets by mouth every 6 (six) hours as needed (as needed for Pain).   20 each   0   . metoCLOPramide (REGLAN) 10 MG tablet   Oral   Take 1 tablet (10 mg total) by mouth 4 (four) times daily.   120 tablet   0   . omeprazole (PRILOSEC) 40 MG capsule   Oral   Take 40 mg by mouth 2 (two) times daily.          . ondansetron (ZOFRAN-ODT) 4 MG disintegrating tablet   Oral   Take 4 mg by mouth every 4 (four) hours as needed for nausea.         . predniSONE (DELTASONE) 5 MG tablet   Oral   Take 5 mg by mouth daily.         . promethazine (PHENERGAN) 25 MG tablet   Oral   Take 25 mg by mouth every 6 (six) hours as needed for nausea.          [name] tacrolimus (PROGRAF) 1 MG capsule   Oral   Take 3 mg by mouth 2 (two) times daily.           BP 128/69  Pulse 72  Temp(Src) 98.5 F (36.9 C) (Oral)  Resp 18  SpO2 100% Physical Exam  Nursing note and vitals reviewed. Constitutional: She is oriented to person, place, and time. She appears well-developed and well-nourished.  HENT:  Head: Normocephalic and atraumatic.  Right Ear: External ear normal.  Left Ear: External ear normal.  Nose: Nose normal.  Mouth/Throat: Oropharynx is clear and moist.  Eyes: Conjunctivae and EOM are normal. Pupils are equal, round, and reactive to light.  Neck: Normal range of motion. Neck supple.  Cardiovascular: Normal rate, regular  rhythm, normal heart sounds and intact distal pulses.   Pulmonary/Chest: Effort normal and breath sounds normal. No respiratory distress.  She has no wheezes. She has no rales. She exhibits no tenderness.  Abdominal: Soft. Bowel sounds are normal. She exhibits no distension and no mass. There is tenderness. There is no rebound and no guarding.  Soft, mild diffuse TTP, no rebound, no guarding, nonperitonitic.  Musculoskeletal: Normal range of motion.  Neurological: She is alert and oriented to person, place, and time.  Skin: Skin is warm and dry.    ED Course  Procedures (including critical care time) Labs Review Labs Reviewed - No data to display Imaging Review No results found.  EKG Interpretation   None       MDM   Final diagnoses:  None   48 yo F hx of ESRD s/p renal tx, CVA, gastroparesis presents with CC abdominal pain, n/v.   Filed Vitals:   03/27/13 0904  BP: 128/69  Pulse: 72  Temp: 98.5 F (36.9 C)  Resp: 18   Physical exam as above.  Vitals WNL afebrile, not tachycardic, normotensive.  Abdominal exam soft, no guarding, no rebound, no distension, but with mild diffuse abdominal pain.  Will give IVF, dilaudid, Reglan for symptoms.  Will get CBC, CMP, Lipase, Urine preg, UA, and reeval.  CBC with chronic normocytic anemia, similar to previous.  WBC WNL.  Cr 1.8, which is improved from previous 2.3.  LFTs WNL, and Lipase WNL.  UA negative for nitrite, leukocytes.  Pregnancy test is negative.    Pt given IVF hydration, treated symptomatically with dilaudid 1 mg X 2, Reglan 10 mg IV.  On reeval pt states symptoms have significantly improved.  She is tolerated PO in room, and states she is ready to go home.  Pt to be d/c home in good condition.  Encouraged to continue supportive care.  Rx Percocet.  Pt has Reglan, Zofran, Phenergan at home.  F/u with PCP in 1 week.  Return precautions given.  Pt understands and agrees with plan.  I have discussed pt's care plan with Dr.  Alvino Chapel.  Sinda Du, MD    Sinda Du, MD 03/27/13 [phone]

## 2013-03-28 NOTE — ED Provider Notes (Signed)
I saw and evaluated the patient, reviewed the resident's note and I agree with the findings and plan.  EKG Interpretation   None      Patient with likely gastroparesis flare. Feels better after treatment. Lab work reassuring. Discharge home. Patient feels much better after treatment  Jasper Riling. Alvino Chapel, MD 03/28/13 [phone]

## 2013-04-06 ENCOUNTER — Encounter (HOSPITAL_COMMUNITY)
Admission: RE | Admit: 2013-04-06 | Discharge: 2013-04-06 | Disposition: A | Discharge status: Home / Self Care | Payer: Medicaid Other | Source: Ambulatory Visit | Attending: Nephrology | Admitting: Nephrology

## 2013-04-06 DIAGNOSIS — N183 Chronic kidney disease, stage 3 unspecified: Secondary | ICD-10-CM | POA: Insufficient documentation

## 2013-04-06 DIAGNOSIS — D638 Anemia in other chronic diseases classified elsewhere: Secondary | ICD-10-CM | POA: Insufficient documentation

## 2013-04-06 LAB — RENAL FUNCTION PANEL
Albumin: 3.8 g/dL (ref 3.5–5.2)
BUN: 23 mg/dL (ref 6–23)
CO2: 24 mEq/L (ref 19–32)
Calcium: 9.9 mg/dL (ref 8.4–10.5)
Chloride: 106 mEq/L (ref 96–112)
Creatinine, Ser: 1.68 mg/dL — ABNORMAL HIGH (ref 0.50–1.10)
GFR calc Af Amer: 41 mL/min — ABNORMAL LOW (ref >90)
GFR calc non Af Amer: 36 mL/min — ABNORMAL LOW (ref >90)
Glucose, Bld: 76 mg/dL (ref 70–99)
Phosphorus: 2.5 mg/dL (ref 2.3–4.6)
Potassium: 4.5 mEq/L (ref 3.7–5.3)
Sodium: 142 mEq/L (ref 137–147)

## 2013-04-06 LAB — POCT HEMOGLOBIN-HEMACUE: Hemoglobin: 10.6 g/dL — ABNORMAL LOW (ref 12.0–15.0)

## 2013-04-06 MED ORDER — EPOETIN ALFA 40000 UNIT/ML IJ SOLN
INTRAMUSCULAR | Status: AC
  Administered 2013-04-06: 40000 [IU] via SUBCUTANEOUS
  Filled 2013-04-06: qty 1

## 2013-04-06 MED ORDER — EPOETIN ALFA 20000 UNIT/ML IJ SOLN: 40000.0000 [IU] | INTRAMUSCULAR | Status: DC

## 2013-04-07 ENCOUNTER — Encounter (HOSPITAL_COMMUNITY): Payer: Medicaid Other

## 2013-04-21 ENCOUNTER — Encounter (HOSPITAL_COMMUNITY): Payer: Medicaid Other

## 2013-05-05 ENCOUNTER — Encounter (HOSPITAL_COMMUNITY)
Admission: RE | Admit: 2013-05-05 | Discharge: 2013-05-05 | Disposition: A | Discharge status: Home / Self Care | Payer: Medicaid Other | Source: Ambulatory Visit | Attending: Nephrology | Admitting: Nephrology

## 2013-05-05 DIAGNOSIS — N183 Chronic kidney disease, stage 3 unspecified: Secondary | ICD-10-CM | POA: Insufficient documentation

## 2013-05-05 DIAGNOSIS — D638 Anemia in other chronic diseases classified elsewhere: Secondary | ICD-10-CM | POA: Insufficient documentation

## 2013-05-05 LAB — FERRITIN: Ferritin: 1731 ng/mL — ABNORMAL HIGH (ref 10–291)

## 2013-05-05 LAB — RENAL FUNCTION PANEL
Albumin: 4.1 g/dL (ref 3.5–5.2)
BUN: 29 mg/dL — ABNORMAL HIGH (ref 6–23)
CO2: 24 mEq/L (ref 19–32)
Calcium: 10.2 mg/dL (ref 8.4–10.5)
Chloride: 102 mEq/L (ref 96–112)
Creatinine, Ser: 2.45 mg/dL — ABNORMAL HIGH (ref 0.50–1.10)
GFR calc Af Amer: 26 mL/min — ABNORMAL LOW (ref >90)
GFR calc non Af Amer: 23 mL/min — ABNORMAL LOW (ref >90)
Glucose, Bld: 89 mg/dL (ref 70–99)
Phosphorus: 2.8 mg/dL (ref 2.3–4.6)
Potassium: 4 mEq/L (ref 3.7–5.3)
Sodium: 142 mEq/L (ref 137–147)

## 2013-05-05 LAB — POCT HEMOGLOBIN-HEMACUE: Hemoglobin: 11.1 g/dL — ABNORMAL LOW (ref 12.0–15.0)

## 2013-05-05 LAB — IRON AND TIBC
Iron: 98 ug/dL (ref 42–135)
Saturation Ratios: 49 % (ref 20–55)
TIBC: 201 ug/dL — ABNORMAL LOW (ref 250–470)
UIBC: 103 ug/dL — ABNORMAL LOW (ref 125–400)

## 2013-05-05 MED ORDER — EPOETIN ALFA 40000 UNIT/ML IJ SOLN
INTRAMUSCULAR | Status: AC
  Administered 2013-05-05: 40000 [IU] via SUBCUTANEOUS
  Filled 2013-05-05: qty 1

## 2013-05-05 MED ORDER — EPOETIN ALFA 20000 UNIT/ML IJ SOLN: 40000.0000 [IU] | INTRAMUSCULAR | Status: DC

## 2013-05-07 ENCOUNTER — Encounter (HOSPITAL_COMMUNITY): Payer: Self-pay | Admitting: Emergency Medicine

## 2013-05-07 ENCOUNTER — Emergency Department (HOSPITAL_COMMUNITY)
Admission: EM | Admit: 2013-05-07 | Discharge: 2013-05-07 | Disposition: A | Discharge status: Home / Self Care | Payer: Medicaid Other | Attending: Emergency Medicine | Admitting: Emergency Medicine

## 2013-05-07 ENCOUNTER — Emergency Department (HOSPITAL_COMMUNITY)
Admission: EM | Admit: 2013-05-07 | Discharge: 2013-05-07 | Disposition: A | Discharge status: Home / Self Care | Payer: Medicaid Other | Source: Home / Self Care | Attending: Emergency Medicine | Admitting: Emergency Medicine

## 2013-05-07 DIAGNOSIS — K219 Gastro-esophageal reflux disease without esophagitis: Secondary | ICD-10-CM | POA: Insufficient documentation

## 2013-05-07 DIAGNOSIS — Z992 Dependence on renal dialysis: Secondary | ICD-10-CM | POA: Insufficient documentation

## 2013-05-07 DIAGNOSIS — I120 Hypertensive chronic kidney disease with stage 5 chronic kidney disease or end stage renal disease: Secondary | ICD-10-CM | POA: Insufficient documentation

## 2013-05-07 DIAGNOSIS — Z8619 Personal history of other infectious and parasitic diseases: Secondary | ICD-10-CM | POA: Insufficient documentation

## 2013-05-07 DIAGNOSIS — M109 Gout, unspecified: Secondary | ICD-10-CM | POA: Insufficient documentation

## 2013-05-07 DIAGNOSIS — Z940 Kidney transplant status: Secondary | ICD-10-CM

## 2013-05-07 DIAGNOSIS — H5710 Ocular pain, unspecified eye: Secondary | ICD-10-CM

## 2013-05-07 DIAGNOSIS — G8929 Other chronic pain: Secondary | ICD-10-CM | POA: Insufficient documentation

## 2013-05-07 DIAGNOSIS — Z792 Long term (current) use of antibiotics: Secondary | ICD-10-CM | POA: Insufficient documentation

## 2013-05-07 DIAGNOSIS — K3184 Gastroparesis: Secondary | ICD-10-CM

## 2013-05-07 DIAGNOSIS — IMO0002 Reserved for concepts with insufficient information to code with codable children: Secondary | ICD-10-CM | POA: Insufficient documentation

## 2013-05-07 DIAGNOSIS — H5789 Other specified disorders of eye and adnexa: Secondary | ICD-10-CM

## 2013-05-07 DIAGNOSIS — Z862 Personal history of diseases of the blood and blood-forming organs and certain disorders involving the immune mechanism: Secondary | ICD-10-CM

## 2013-05-07 DIAGNOSIS — Z79899 Other long term (current) drug therapy: Secondary | ICD-10-CM | POA: Insufficient documentation

## 2013-05-07 DIAGNOSIS — Z7982 Long term (current) use of aspirin: Secondary | ICD-10-CM | POA: Insufficient documentation

## 2013-05-07 DIAGNOSIS — Z87891 Personal history of nicotine dependence: Secondary | ICD-10-CM

## 2013-05-07 DIAGNOSIS — M949 Disorder of cartilage, unspecified: Secondary | ICD-10-CM

## 2013-05-07 DIAGNOSIS — Z9889 Other specified postprocedural states: Secondary | ICD-10-CM | POA: Insufficient documentation

## 2013-05-07 DIAGNOSIS — N186 End stage renal disease: Secondary | ICD-10-CM

## 2013-05-07 DIAGNOSIS — R220 Localized swelling, mass and lump, head: Secondary | ICD-10-CM

## 2013-05-07 DIAGNOSIS — Z8673 Personal history of transient ischemic attack (TIA), and cerebral infarction without residual deficits: Secondary | ICD-10-CM | POA: Insufficient documentation

## 2013-05-07 DIAGNOSIS — Z8739 Personal history of other diseases of the musculoskeletal system and connective tissue: Secondary | ICD-10-CM

## 2013-05-07 DIAGNOSIS — F3289 Other specified depressive episodes: Secondary | ICD-10-CM | POA: Insufficient documentation

## 2013-05-07 DIAGNOSIS — F329 Major depressive disorder, single episode, unspecified: Secondary | ICD-10-CM | POA: Insufficient documentation

## 2013-05-07 DIAGNOSIS — R221 Localized swelling, mass and lump, neck: Secondary | ICD-10-CM

## 2013-05-07 DIAGNOSIS — Z8659 Personal history of other mental and behavioral disorders: Secondary | ICD-10-CM | POA: Insufficient documentation

## 2013-05-07 DIAGNOSIS — Z905 Acquired absence of kidney: Secondary | ICD-10-CM

## 2013-05-07 DIAGNOSIS — M899 Disorder of bone, unspecified: Secondary | ICD-10-CM | POA: Insufficient documentation

## 2013-05-07 DIAGNOSIS — Z8711 Personal history of peptic ulcer disease: Secondary | ICD-10-CM | POA: Insufficient documentation

## 2013-05-07 LAB — URINALYSIS, ROUTINE W REFLEX MICROSCOPIC
Bilirubin Urine: NEGATIVE
Glucose, UA: NEGATIVE mg/dL
Hgb urine dipstick: NEGATIVE
Ketones, ur: NEGATIVE mg/dL
Leukocytes, UA: NEGATIVE
Nitrite: NEGATIVE
Protein, ur: 100 mg/dL — AB
Specific Gravity, Urine: 1.016 (ref 1.005–1.030)
Urobilinogen, UA: 0.2 mg/dL (ref 0.0–1.0)
pH: 7 (ref 5.0–8.0)

## 2013-05-07 LAB — CBC WITH DIFFERENTIAL/PLATELET
Basophils Absolute: 0 10*3/uL (ref 0.0–0.1)
Basophils Relative: 0 % (ref 0–1)
Eosinophils Absolute: 0 10*3/uL (ref 0.0–0.7)
Eosinophils Relative: 1 % (ref 0–5)
HCT: 31.4 % — ABNORMAL LOW (ref 36.0–46.0)
Hemoglobin: 11.1 g/dL — ABNORMAL LOW (ref 12.0–15.0)
Lymphocytes Relative: 12 % (ref 12–46)
Lymphs Abs: 0.8 10*3/uL (ref 0.7–4.0)
MCH: 32.5 pg (ref 26.0–34.0)
MCHC: 35.4 g/dL (ref 30.0–36.0)
MCV: 91.8 fL (ref 78.0–100.0)
Monocytes Absolute: 0.4 10*3/uL (ref 0.1–1.0)
Monocytes Relative: 6 % (ref 3–12)
Neutro Abs: 5.2 10*3/uL (ref 1.7–7.7)
Neutrophils Relative %: 81 % — ABNORMAL HIGH (ref 43–77)
Platelets: 148 10*3/uL — ABNORMAL LOW (ref 150–400)
RBC: 3.42 MIL/uL — ABNORMAL LOW (ref 3.87–5.11)
RDW: 19.8 % — ABNORMAL HIGH (ref 11.5–15.5)
WBC: 6.4 10*3/uL (ref 4.0–10.5)

## 2013-05-07 LAB — URINE MICROSCOPIC-ADD ON

## 2013-05-07 LAB — COMPREHENSIVE METABOLIC PANEL
ALT: 7 U/L (ref 0–35)
AST: 15 U/L (ref 0–37)
Albumin: 4.4 g/dL (ref 3.5–5.2)
Alkaline Phosphatase: 81 U/L (ref 39–117)
BUN: 27 mg/dL — ABNORMAL HIGH (ref 6–23)
CO2: 22 mEq/L (ref 19–32)
Calcium: 10.7 mg/dL — ABNORMAL HIGH (ref 8.4–10.5)
Chloride: 102 mEq/L (ref 96–112)
Creatinine, Ser: 2.4 mg/dL — ABNORMAL HIGH (ref 0.50–1.10)
GFR calc Af Amer: 27 mL/min — ABNORMAL LOW (ref >90)
GFR calc non Af Amer: 23 mL/min — ABNORMAL LOW (ref >90)
Glucose, Bld: 111 mg/dL — ABNORMAL HIGH (ref 70–99)
Potassium: 3.6 mEq/L — ABNORMAL LOW (ref 3.7–5.3)
Sodium: 144 mEq/L (ref 137–147)
Total Bilirubin: 0.4 mg/dL (ref 0.3–1.2)
Total Protein: 7.2 g/dL (ref 6.0–8.3)

## 2013-05-07 LAB — LIPASE, BLOOD: Lipase: 36 U/L (ref 11–59)

## 2013-05-07 MED ORDER — HYDROMORPHONE HCL PF 1 MG/ML IJ SOLN
1.0000 mg | Freq: Once | INTRAMUSCULAR | Status: AC
  Administered 2013-05-07: 1 mg via INTRAVENOUS
  Filled 2013-05-07: qty 1

## 2013-05-07 MED ORDER — METOCLOPRAMIDE HCL 5 MG/ML IJ SOLN
10.0000 mg | Freq: Once | INTRAMUSCULAR | Status: AC
  Administered 2013-05-07: 10 mg via INTRAMUSCULAR

## 2013-05-07 MED ORDER — ERYTHROMYCIN 5 MG/GM OP OINT
TOPICAL_OINTMENT | Freq: Once | OPHTHALMIC | Status: AC
  Administered 2013-05-07: 03:00:00 via OPHTHALMIC
  Filled 2013-05-07: qty 1

## 2013-05-07 MED ORDER — PROMETHAZINE HCL 25 MG PO TABS: 25.0000 mg | ORAL_TABLET | Freq: Four times a day (QID) | ORAL | Status: DC | PRN

## 2013-05-07 MED ORDER — CEPHALEXIN 250 MG PO CAPS
500.0000 mg | ORAL_CAPSULE | Freq: Once | ORAL | Status: AC
  Administered 2013-05-07: 500 mg via ORAL
  Filled 2013-05-07: qty 2

## 2013-05-07 MED ORDER — FLUORESCEIN SODIUM 1 MG OP STRP
1.0000 | ORAL_STRIP | Freq: Once | OPHTHALMIC | Status: AC
  Administered 2013-05-07: 02:00:00 via OPHTHALMIC
  Filled 2013-05-07: qty 1

## 2013-05-07 MED ORDER — HYDROCODONE-ACETAMINOPHEN 5-325 MG PO TABS: 2.0000 | ORAL_TABLET | ORAL | Status: DC | PRN

## 2013-05-07 MED ORDER — SODIUM CHLORIDE 0.9 % IV BOLUS (SEPSIS)
1000.0000 mL | Freq: Once | INTRAVENOUS | Status: AC
  Administered 2013-05-07: 1000 mL via INTRAVENOUS

## 2013-05-07 MED ORDER — DIPHENHYDRAMINE HCL 50 MG/ML IJ SOLN
25.0000 mg | Freq: Once | INTRAMUSCULAR | Status: AC
  Administered 2013-05-07: 25 mg via INTRAMUSCULAR

## 2013-05-07 MED ORDER — CEPHALEXIN 500 MG PO CAPS: 500.0000 mg | ORAL_CAPSULE | Freq: Four times a day (QID) | ORAL | Status: DC

## 2013-05-07 MED ORDER — ONDANSETRON HCL 4 MG/2ML IJ SOLN
4.0000 mg | Freq: Once | INTRAMUSCULAR | Status: AC
  Administered 2013-05-07: 4 mg via INTRAVENOUS
  Filled 2013-05-07: qty 2

## 2013-05-07 MED ORDER — TETRACAINE HCL 0.5 % OP SOLN
2.0000 [drp] | Freq: Once | OPHTHALMIC | Status: AC
  Administered 2013-05-07: 2 [drp] via OPHTHALMIC
  Filled 2013-05-07: qty 2

## 2013-05-07 MED ORDER — DIPHENHYDRAMINE HCL 50 MG/ML IJ SOLN
25.0000 mg | Freq: Once | INTRAMUSCULAR | Status: DC
  Filled 2013-05-07: qty 1

## 2013-05-07 MED ORDER — METOCLOPRAMIDE HCL 5 MG/ML IJ SOLN
10.0000 mg | Freq: Once | INTRAMUSCULAR | Status: DC
  Filled 2013-05-07: qty 2

## 2013-05-07 NOTE — Discharge Instructions (Signed)
FOLLOW UP WITH YOUR EYE DOCTOR FOR RECHECK IN 2 DAYS. USE MEDICATIONS AS DIRECTED. RETURN HERE IF SYMPTOMS WORSEN OR FOR NEW CONCERN.

## 2013-05-07 NOTE — ED Provider Notes (Signed)
CSN: [id number]     Arrival date & time 05/07/13  1504 History   First MD Initiated Contact with Patient 05/07/13 1517     Chief Complaint  Patient presents with  . Abdominal Pain  . Emesis     (Consider location/radiation/quality/duration/timing/severity/associated sxs/prior Treatment) HPI  Tina Watkins Is a 48 year old female with a very significant past medical history including recurrent gastroparesis, previous stroke, hypertension, history of several previous kidney transplants.  Patient states that today around 3:00 she began having severe nausea and vomiting.  She states it is the same as her previous gastroparesis "attacks." Denies chest pain, shortness of breath, fever or.  States that her abdominal pain is localized to the epigastrium.  She denies any urinary or vaginal symptoms.  Past Medical History  Diagnosis Date  . Gastroparesis   . Gout   . Hypotension   . Herpes genitalia 1994  . E coli bacteremia 06/18/2011  . Bacteremia due to Gram-negative bacteria 05/23/2011  . History of blood transfusion     "more than a few times" (12/11/2011)  . History of stomach ulcers   . Stroke      left basal ganglia lacunar infarct; Right frontal lobe lacunar infarct.  . Stroke ~ 1999; 2001    "briefly lost my vision; lost my right eye" (12/11/2011)  . Chronic lower back pain   . Depression   . Osteopenia   . Headache(784.0)     "not often anymore" (12/11/2011)  . Complication of anesthesia     "woke up during OR; I have an extremely high tolerance" (12/11/2011)  . Dysrhythmia     "tachycardia" (12/11/2011)  . ESRD (end stage renal disease) 06/12/2011  . Iron deficiency anemia   . Seizures 1994    "post transplant; only have had that one" (12/11/2011)  . GERD (gastroesophageal reflux disease)    Past Surgical History  Procedure Laterality Date  . Kidney transplant  1994; 1999; 2005    "right"  . Appendectomy  ~ 2004  . Insertion of dialysis catheter  1988    "AV graft LUA  & LFA; LUA worked for 1 day; LFA never workedChief Strategy Officer  . Total nephrectomy  1988?; 1994; 2005  . Multiple tooth extractions    . Enucleation  2001    "right"  . Esophagogastroduodenoscopy (egd) with propofol N/A 04/21/2012    Procedure: ESOPHAGOGASTRODUODENOSCOPY (EGD) WITH PROPOFOL;  Surgeon: Milus Banister, MD;  Location: [facility];  Service: Endoscopy;  Laterality: N/A;   Family History  Problem Relation Age of Onset  . Hypertension Maternal Grandmother    History  Substance Use Topics  . Smoking status: Former Smoker -- 0.50 packs/day for 28 years    Types: Cigarettes    Quit date: 03/05/2011  . Smokeless tobacco: Never Used  . Alcohol Use: Yes     Comment: uses daily   OB History   Grav Para Term Preterm Abortions TAB SAB Ect Mult Living                 Review of Systems  Ten systems reviewed and are negative for acute change, except as noted in the HPI.    Allergies  Levofloxacin  Home Medications   Current Outpatient Rx  Name  Route  Sig  Dispense  Refill  . aspirin EC 81 MG tablet   Oral   Take 81 mg by mouth daily.         [name] azaTHIOprine (IMURAN) 50 MG tablet   Oral  Take 100 mg by mouth daily.          . calcitRIOL (ROCALTROL) 0.25 MCG capsule   Oral   Take 0.25 mcg by mouth daily before breakfast.          . cephALEXin (KEFLEX) 500 MG capsule   Oral   Take 1 capsule (500 mg total) by mouth 4 (four) times daily.   20 capsule   0   . colchicine 0.6 MG tablet   Oral   Take 0.6 mg by mouth daily as needed (For gout.).         [name] dicyclomine (BENTYL) 20 MG tablet   Oral   Take 20 mg by mouth 2 (two) times daily as needed (for abdominal cramping).         . furosemide (LASIX) 40 MG tablet   Oral   Take 40 mg by mouth every evening.          [name] HYDROcodone-acetaminophen (NORCO/VICODIN) 5-325 MG per tablet   Oral   Take 1 tablet by mouth every 4 (four) hours as needed for pain.   15 tablet   0   . Hydrocodone-Acetaminophen 7.5-300 MG  TABS   Oral   Take 1-2 tablets by mouth every 6 (six) hours as needed (as needed for Pain).   20 each   0   . metoCLOPramide (REGLAN) 5 MG tablet   Oral   Take 5 mg by mouth 4 (four) times daily.         [name] omeprazole (PRILOSEC) 40 MG capsule   Oral   Take 40 mg by mouth 2 (two) times daily.          . ondansetron (ZOFRAN-ODT) 4 MG disintegrating tablet   Oral   Take 4 mg by mouth every 4 (four) hours as needed for nausea.         . Oxycodone HCl 10 MG TABS   Oral   Take 10 mg by mouth daily as needed (for pain).         [name] oxyCODONE-acetaminophen (PERCOCET) 10-325 MG per tablet   Oral   Take 1 tablet by mouth every 4 (four) hours as needed for pain.         [name] oxyCODONE-acetaminophen (PERCOCET) 10-325 MG per tablet   Oral   Take 1 tablet by mouth every 6 (six) hours as needed for pain.   12 tablet   0   . predniSONE (DELTASONE) 5 MG tablet   Oral   Take 5 mg by mouth daily.         . promethazine (PHENERGAN) 25 MG tablet   Oral   Take 25 mg by mouth every 6 (six) hours as needed for nausea.          [name] tacrolimus (PROGRAF) 1 MG capsule   Oral   Take 3 mg by mouth 2 (two) times daily.           BP 118/61  Pulse 62  Temp(Src) 98.1 F (36.7 C) (Oral)  Resp 32  Ht 4\' 11"  (1.499 m)  Wt 120 lb (54.432 kg)  BMI 24.22 kg/m2  SpO2 100% Physical Exam  Vitals reviewed. Constitutional: She is oriented to person, place, and time. She appears well-developed and well-nourished. No distress.  Patient is actively vomiting and retching loudly  HENT:  Head: Normocephalic and atraumatic.  Eyes: Conjunctivae are normal. No scleral icterus.  Neck: Normal range of motion.  Cardiovascular: Normal rate, regular rhythm and normal heart sounds.  Exam reveals no gallop and no friction rub.   No murmur heard. Pulmonary/Chest: Effort normal and breath sounds normal. No respiratory distress.  Abdominal: Soft. Bowel sounds are normal. She exhibits no distension and no  mass. There is tenderness (epigastrium). There is no guarding.  Neurological: She is alert and oriented to person, place, and time.  Skin: Skin is warm and dry. She is not diaphoretic.   [name] ED Course  Procedures (including critical care time) Labs Review Labs Reviewed  CBC WITH DIFFERENTIAL  COMPREHENSIVE METABOLIC PANEL  LIPASE, BLOOD  URINALYSIS, ROUTINE W REFLEX MICROSCOPIC   Imaging Review No results found.   EKG Interpretation None      MDM   Final diagnoses:  Gastroparesis     patient with mild anemia. Appears at baseline.  Urine with crystals, appears contaminated. Mild hyokalemia. Creatinine at baseline. Lipase wnl.  Patient treated with reglan/ benadryl, zofran, and dilaudid.  Patient is nontoxic, nonseptic appearing, in no apparent distress.  Patient's pain and other symptoms adequately managed in emergency department.  Fluid bolus given.  Labs, imaging and vitals reviewed.  Patient does not meet the SIRS or Sepsis criteria.  On repeat exam patient does not have a surgical abdomin and there are nor peritoneal signs.  No indication of appendicitis, bowel obstruction, bowel perforation, cholecystitis, diverticulitis.  Patient discharged home with symptomatic treatment and given strict instructions for follow-up with their primary care physician.  I have also discussed reasons to return immediately to the ER.  Patient expresses understanding and agrees with plan.         Margarita Mail, PA-C 05/09/13 1016

## 2013-05-07 NOTE — ED Notes (Addendum)
Pt reports "she is having a gastroparesis attack" having abd pain and n/v. Very vague when answering questions at triage, pt hyperventilating and vomiting at triage. Pt was seen here this am for eye pain. Pt very anxious and restless at triage, getting out of wheelchair and very unsteady on feet.

## 2013-05-07 NOTE — ED Notes (Signed)
Patient stated that about 5 hours ago her left eye started hurting.  Does not remember doing anything to make it hurt.  On her way here she noticed the left top lid started to swell.  Left upper eye lid swollen

## 2013-05-07 NOTE — ED Provider Notes (Signed)
CSN: [id number]     Arrival date & time 05/07/13  0014 History   First MD Initiated Contact with Patient 05/07/13 0116     Chief Complaint  Patient presents with  . Eye Pain     (Consider location/radiation/quality/duration/timing/severity/associated sxs/prior Treatment) Patient is a 48 y.o. female presenting with eye pain. The history is provided by the patient. No language interpreter was used.  Eye Pain This is a new problem. The current episode started today. Pertinent negatives include no fever, headaches, nausea or vomiting. Associated symptoms comments: She states that the left eye became painful this evening, with clear drainage and a sensation of a foreign body. No visual change. No headache. .    Past Medical History  Diagnosis Date  . Gastroparesis   . Gout   . Hypotension   . Herpes genitalia 1994  . E coli bacteremia 06/18/2011  . Bacteremia due to Gram-negative bacteria 05/23/2011  . History of blood transfusion     "more than a few times" (12/11/2011)  . History of stomach ulcers   . Stroke      left basal ganglia lacunar infarct; Right frontal lobe lacunar infarct.  . Stroke ~ 1999; 2001    "briefly lost my vision; lost my right eye" (12/11/2011)  . Chronic lower back pain   . Depression   . Osteopenia   . Headache(784.0)     "not often anymore" (12/11/2011)  . Complication of anesthesia     "woke up during OR; I have an extremely high tolerance" (12/11/2011)  . Dysrhythmia     "tachycardia" (12/11/2011)  . ESRD (end stage renal disease) 06/12/2011  . Iron deficiency anemia   . Seizures 1994    "post transplant; only have had that one" (12/11/2011)  . GERD (gastroesophageal reflux disease)    Past Surgical History  Procedure Laterality Date  . Kidney transplant  1994; 1999; 2005    "right"  . Appendectomy  ~ 2004  . 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 Total nephrectomy  1988?; 1994; 2005  .  Multiple tooth extractions    . Enucleation  2001    "right"  . Esophagogastroduodenoscopy (egd) with propofol N/A 04/21/2012    Procedure: ESOPHAGOGASTRODUODENOSCOPY (EGD) WITH PROPOFOL;  Surgeon: Milus Banister, MD;  Location: [facility];  Service: Endoscopy;  Laterality: N/A;   Family History  Problem Relation Age of Onset  . Hypertension Maternal Grandmother    History  Substance Use Topics  . Smoking status: Former Smoker -- 0.50 packs/day for 28 years    Types: Cigarettes    Quit date: 03/05/2011  . Smokeless tobacco: Never Used  . Alcohol Use: Yes     Comment: uses daily   OB History   Grav Para Term Preterm Abortions TAB SAB Ect Mult Living                 Review of Systems  Constitutional: Negative for fever.  HENT: Positive for facial swelling. Negative for sinus pressure.   Eyes: Positive for pain and redness. Negative for photophobia, itching and visual disturbance.  Gastrointestinal: Negative for nausea and vomiting.  Skin: Negative for color change.  Neurological: Negative for headaches.      Allergies  Levofloxacin  Home Medications   Current Outpatient Rx  Name  Route  Sig  Dispense  Refill  . aspirin EC 81 MG tablet   Oral  Take 81 mg by mouth daily.         [name] azaTHIOprine (IMURAN) 50 MG tablet   Oral   Take 100 mg by mouth daily.          . calcitRIOL (ROCALTROL) 0.25 MCG capsule   Oral   Take 0.25 mcg by mouth daily before breakfast.          . cephALEXin (KEFLEX) 500 MG capsule   Oral   Take 1 capsule (500 mg total) by mouth 4 (four) times daily.   20 capsule   0   . colchicine 0.6 MG tablet   Oral   Take 0.6 mg by mouth daily as needed (For gout.).         [name] dicyclomine (BENTYL) 20 MG tablet   Oral   Take 20 mg by mouth 2 (two) times daily as needed (for abdominal cramping).         . furosemide (LASIX) 40 MG tablet   Oral   Take 40 mg by mouth every evening.          [name] HYDROcodone-acetaminophen  (NORCO/VICODIN) 5-325 MG per tablet   Oral   Take 1 tablet by mouth every 4 (four) hours as needed for pain.   15 tablet   0   . Hydrocodone-Acetaminophen 7.5-300 MG TABS   Oral   Take 1-2 tablets by mouth every 6 (six) hours as needed (as needed for Pain).   20 each   0   . metoCLOPramide (REGLAN) 5 MG tablet   Oral   Take 5 mg by mouth 4 (four) times daily.         [name] omeprazole (PRILOSEC) 40 MG capsule   Oral   Take 40 mg by mouth 2 (two) times daily.          . ondansetron (ZOFRAN-ODT) 4 MG disintegrating tablet   Oral   Take 4 mg by mouth every 4 (four) hours as needed for nausea.         . Oxycodone HCl 10 MG TABS   Oral   Take 10 mg by mouth daily as needed (for pain).         [name] oxyCODONE-acetaminophen (PERCOCET) 10-325 MG per tablet   Oral   Take 1 tablet by mouth every 4 (four) hours as needed for pain.         [name] oxyCODONE-acetaminophen (PERCOCET) 10-325 MG per tablet   Oral   Take 1 tablet by mouth every 6 (six) hours as needed for pain.   12 tablet   0   . predniSONE (DELTASONE) 5 MG tablet   Oral   Take 5 mg by mouth daily.         . promethazine (PHENERGAN) 25 MG tablet   Oral   Take 25 mg by mouth every 6 (six) hours as needed for nausea.          [name] tacrolimus (PROGRAF) 1 MG capsule   Oral   Take 3 mg by mouth 2 (two) times daily.           BP 105/57  Pulse 73  Temp(Src) 98 F (36.7 C) (Oral)  Resp 18  SpO2 100% Physical Exam  Constitutional: She is oriented to person, place, and time. She appears well-developed and well-nourished. No distress.  HENT:  Head: Normocephalic and atraumatic.  Eyes: Conjunctivae are normal. Pupils are equal, round, and reactive to light.  Left upper eye lid swollen without redness. FROM without pain. Cornea  clear to fluorescein uptake. Eye pressure normal at 18 and 20.   Neck: Normal range of motion.  Pulmonary/Chest: Effort normal.  Neurological: She is alert and oriented to person, place, and  time.  Skin: Skin is warm and dry. No erythema.  Psychiatric: She has a normal mood and affect.    ED Course  Procedures (including critical care time) Labs Review Labs Reviewed - No data to display Imaging Review No results found.   EKG Interpretation None      MDM   Final diagnoses:  Eye pain   1. Left eye pain  Eyelid swelling without redness and no pain with eye movement, doubt orbital cellulitis. Normal pressures - doubt glaucoma. No purulent drainage and no corneal abrasion.  Discussed with Dr. Sabra Heck. Will give Keflex and erythromycin ointment and encourage ophthalmic follow up 05/08/13 for close recheck.      Dewaine Oats, PA-C 05/07/13 [id number]  Dewaine Oats, PA-C 05/07/13 1704

## 2013-05-07 NOTE — ED Notes (Signed)
Pt. reports left eye pain with redness  and drainage onset this evening , denies injury / no blurred vision .

## 2013-05-07 NOTE — Discharge Instructions (Signed)
Gastroparesis  Gastroparesis is also called slowed stomach emptying (delayed gastric emptying). It is a condition in which the stomach takes too long to empty its contents. It often happens in people with diabetes.  CAUSES  Gastroparesis happens when nerves to the stomach are damaged or stop working. When the nerves are damaged, the muscles of the stomach and intestines do not work normally. The movement of food is slowed or stopped. High blood glucose (sugar) causes changes in nerves and can damage the blood vessels that carry oxygen and nutrients to the nerves. RISK FACTORS  Diabetes.  Post-viral syndromes.  Eating disorders (anorexia, bulimia).  Surgery on the stomach or vagus nerve.  Gastroesophageal reflux disease (rarely).  Smooth muscle disorders (amyloidosis, scleroderma).  Metabolic disorders, including hypothyroidism.  Parkinson disease. SYMPTOMS   Heartburn.  Feeling sick to your stomach (nausea).  Vomiting of undigested food.  An early feeling of fullness when eating.  Weight loss.  Abdominal bloating.  Erratic blood glucose levels.  Lack of appetite.  Gastroesophageal reflux.  Spasms of the stomach wall. Complications can include:  Bacterial overgrowth in stomach. Food stays in the stomach and can ferment and cause bacteria to grow.  Weight loss due to difficulty digesting and absorbing nutrients.  Vomiting.  Obstruction in the stomach. Undigested food can harden and cause nausea and vomiting.  Blood glucose fluctuations caused by inconsistent food absorption. DIAGNOSIS  The diagnosis of gastroparesis is confirmed through one or more of the following tests:  Barium X-rays and scans. These tests look at how long it takes for food to move through the stomach.  Gastric manometry. This test measures electrical and muscular activity in the stomach. A thin tube is passed down the throat into the stomach. The tube contains a wire that takes measurements  of the stomach's electrical and muscular activity as it digests liquids and solid food.  Endoscopy. This procedure is done with a long, thin tube called an endoscope. It is passed through the mouth and gently down the esophagus into the stomach. This tube helps the caregiver look at the lining of the stomach to check for any abnormalities.  Ultrasonography. This can rule out gallbladder disease or pancreatitis. This test will outline and define the shape of the gallbladder and pancreas. TREATMENT   Treatments may include:  Exercise.  Medicines to control nausea and vomiting.  Medicines to stimulate stomach muscles.  Changes in what and when you eat.  Having smaller meals more often.  Eating low-fiber forms of high-fiber foods, such as eating cooked vegetables instead of raw vegetables.  Eating low-fat foods.  Consuming liquids, which are easier to digest.  In severe cases, feeding tubes and intravenous (IV) feeding may be needed. It is important to note that in most cases, treatment does not cure gastroparesis. It is usually a lasting (chronic) condition. Treatment helps you manage the underlying condition so that you can be as healthy and comfortable as possible. Other treatments  A gastric neurostimulator has been developed to assist people with gastroparesis. The battery-operated device is surgically implanted. It emits mild electrical pulses to help improve stomach emptying and to control nausea and vomiting.  The use of botulinum toxin has been shown to improve stomach emptying by decreasing the prolonged contractions of the muscle between the stomach and the small intestine (pyloric sphincter). The benefits are temporary. SEEK MEDICAL CARE IF:   You have diabetes and you are having problems keeping your blood glucose in goal range.  You are having nausea,   vomiting, bloating, or early feelings of fullness with eating.  Your symptoms do not change with a change in  diet. Document Released: 01/19/2005 Document Revised: 05/16/2012 Document Reviewed: 06/28/2008 [employer] Patient Information 2014 [employer].  

## 2013-05-08 IMAGING — CR DG CHEST 2V
2 series · 2 of 2 positions shown · non-contrast
Comparison: April 29, 2009

CLINICAL DATA: Cough

CHEST - 2 VIEW

[w chest pa]
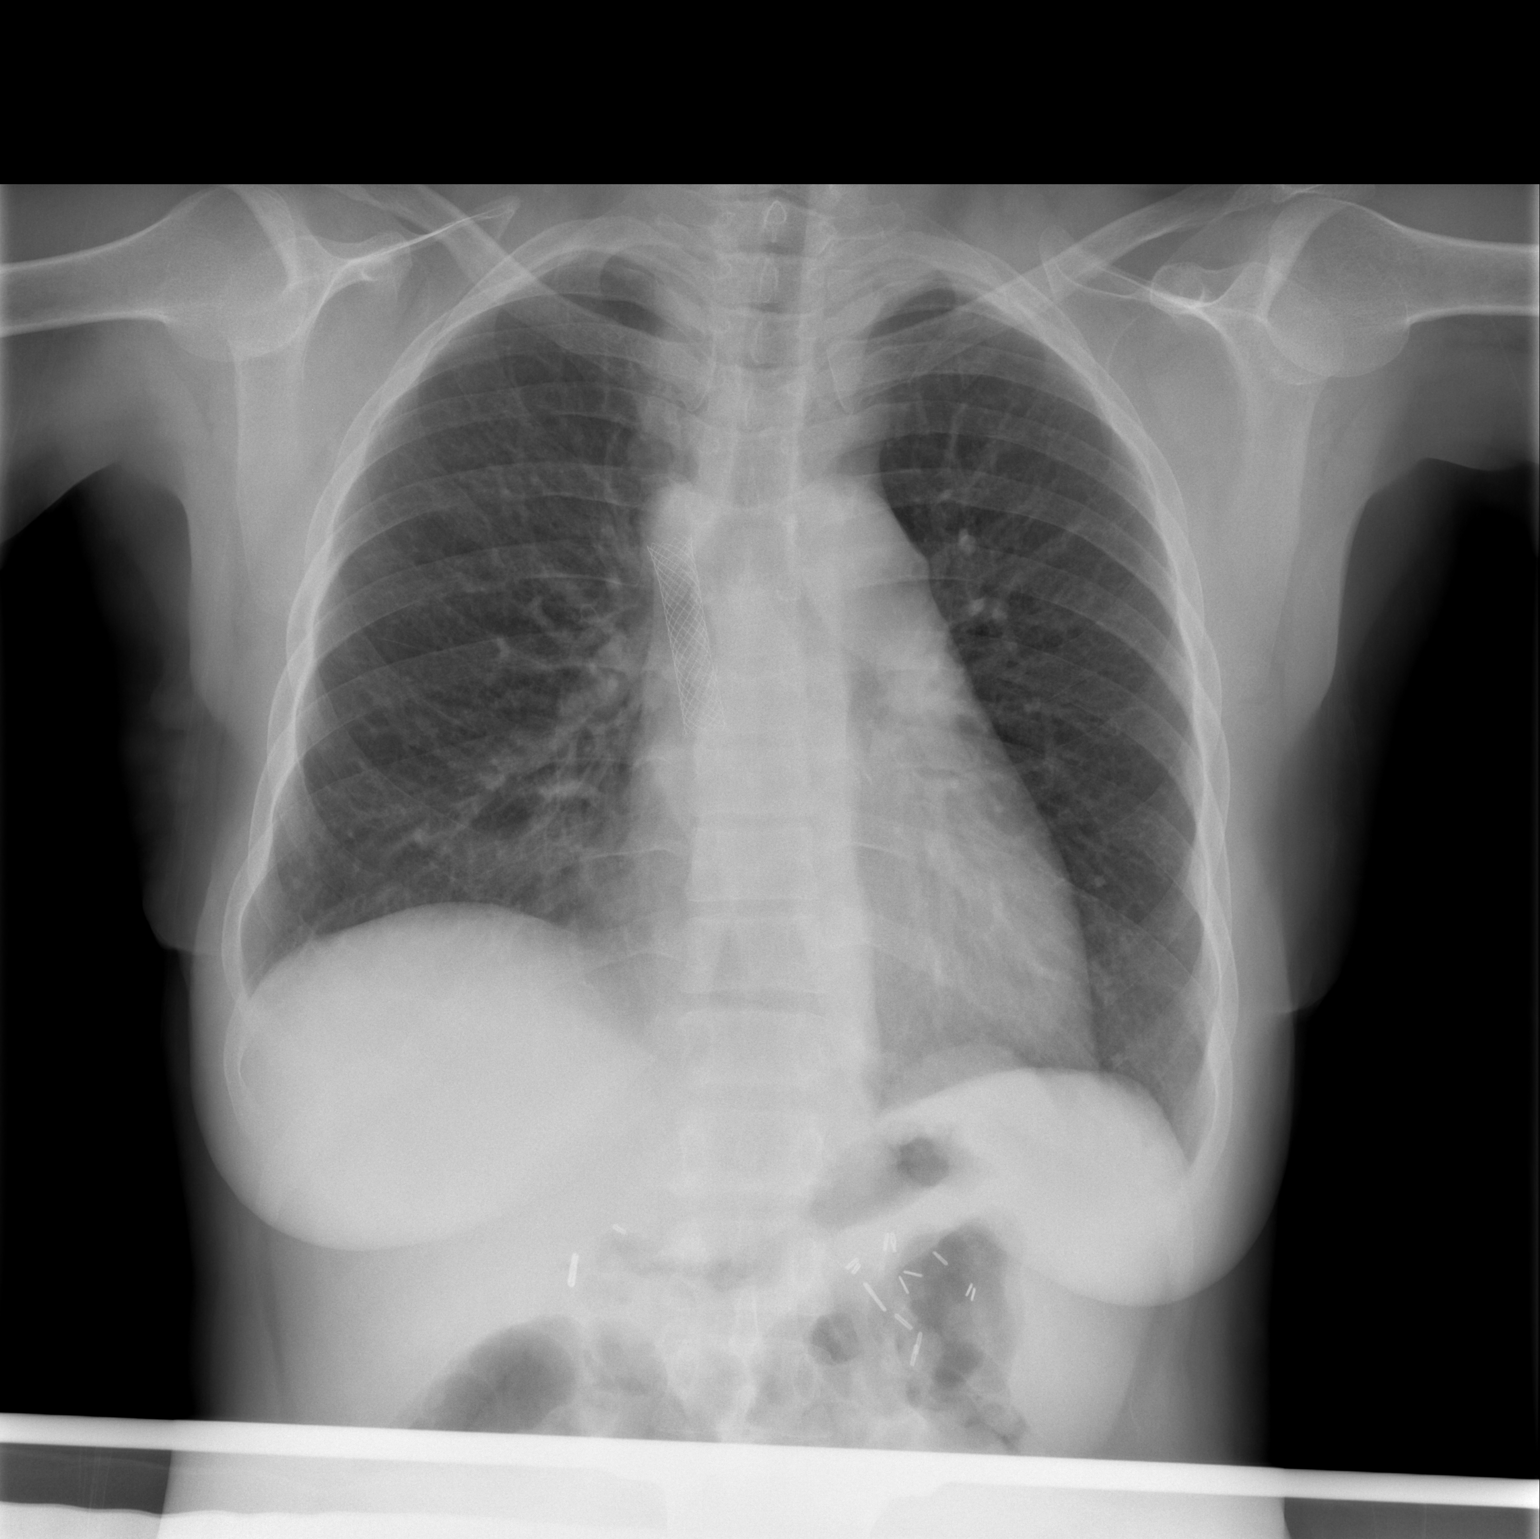

[w chest lat]
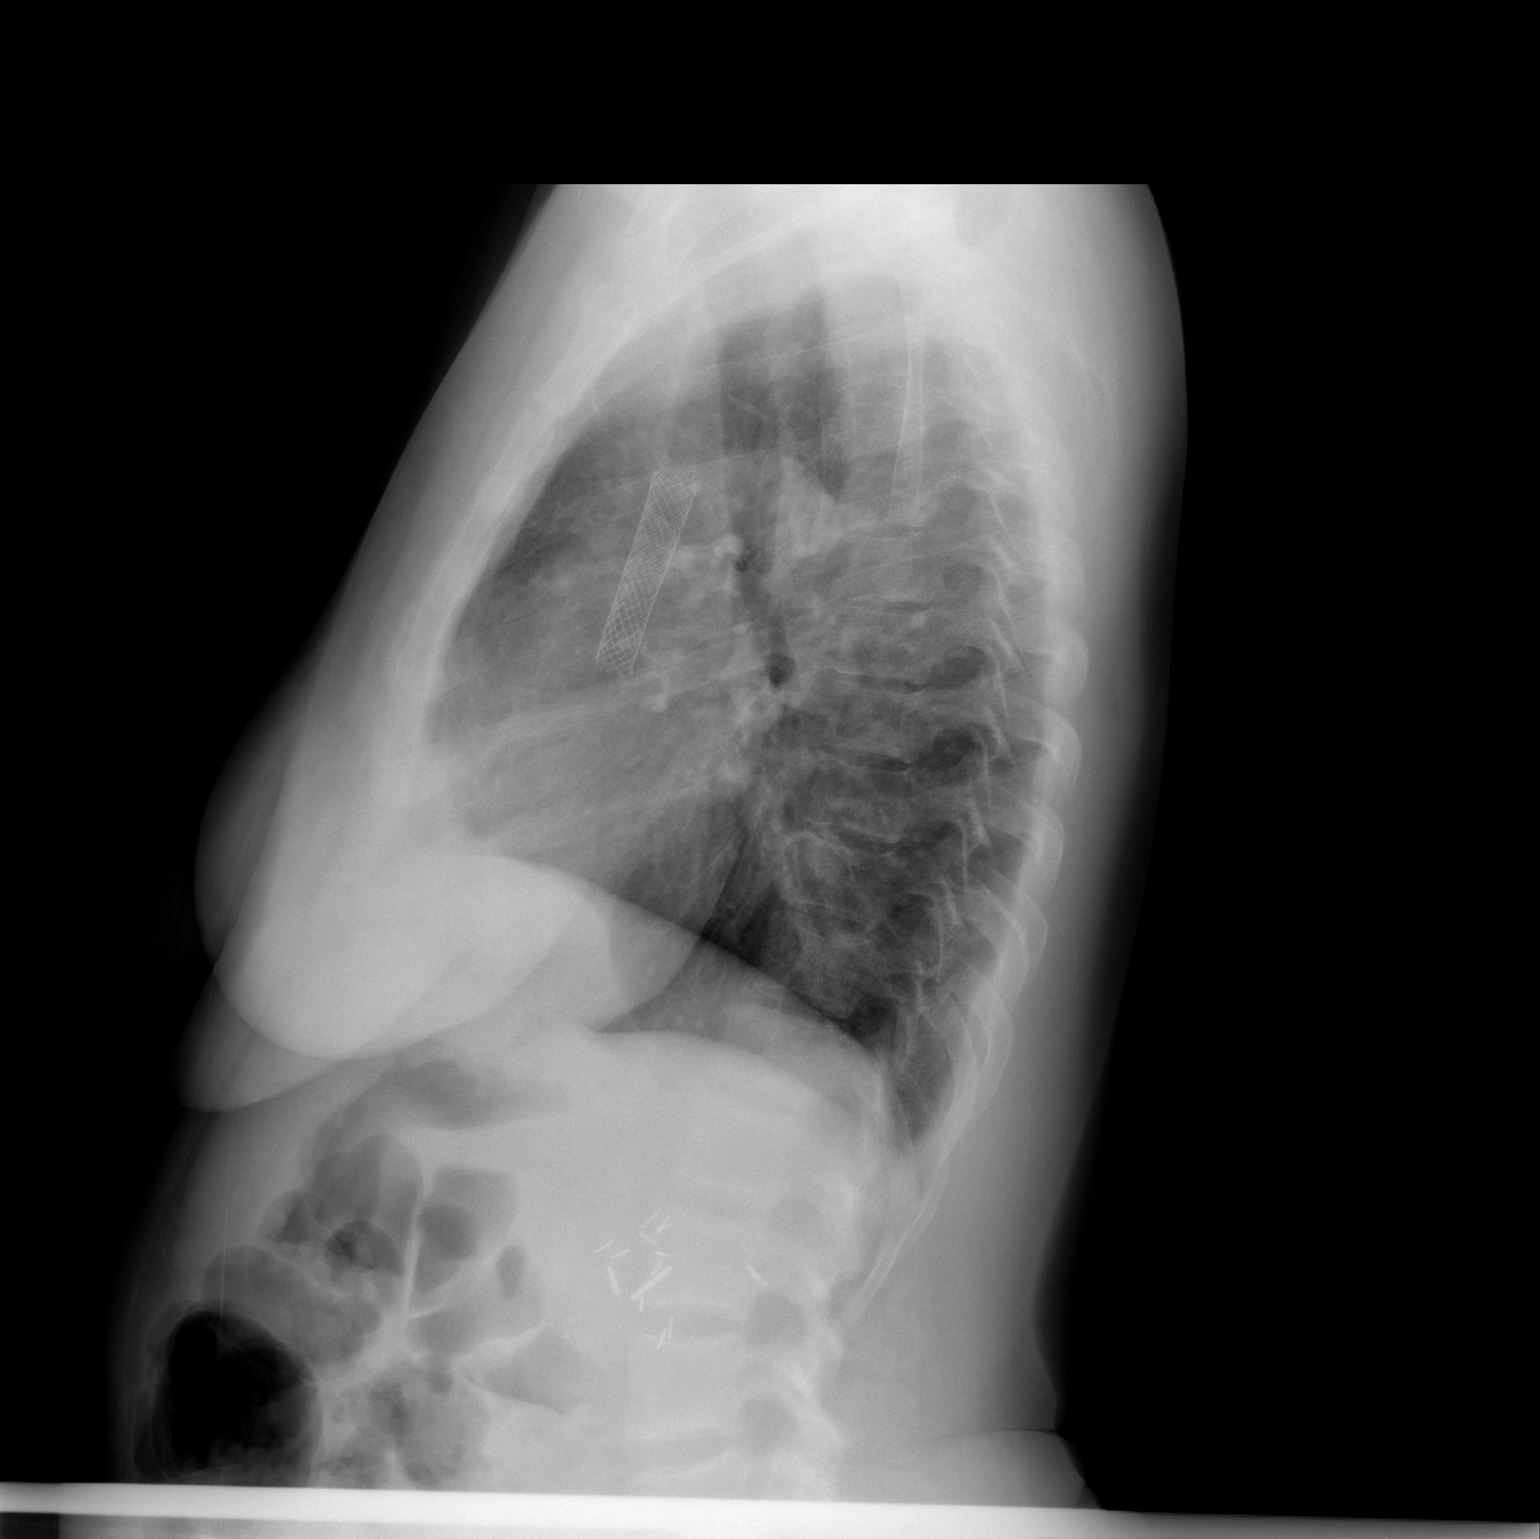

[2 of 2 positions shown; findings below may reference images not displayed]

FINDINGS: The cardiac silhouette, mediastinum, pulmonary
vasculature are within normal limits.  Both lungs are clear.  The
SVC stent is stable in position. There is no acute bony
abnormality.
IMPRESSION: There is no evidence of acute cardiac or pulmonary process.

## 2013-05-08 NOTE — ED Provider Notes (Signed)
Medical screening examination/treatment/procedure(s) were performed by non-physician practitioner and as supervising physician I was immediately available for consultation/collaboration.    Johnna Acosta, MD 05/08/13 [phone]

## 2013-05-10 NOTE — ED Provider Notes (Signed)
Medical screening examination/treatment/procedure(s) were performed by non-physician practitioner and as supervising physician I was immediately available for consultation/collaboration.   EKG Interpretation None        Charles B. Karle Starch, MD 05/10/13 [phone]

## 2013-05-17 ENCOUNTER — Telehealth: Payer: Self-pay | Admitting: Gastroenterology

## 2013-05-17 NOTE — Telephone Encounter (Signed)
Pt was seen in the ER and was told to follow up for her gastroparesis, not able to eat or drink, vomiting even water.  Pt was given an appt for 05/18/13 with Amy Esterwood, no rx until seen

## 2013-05-17 NOTE — Telephone Encounter (Signed)
Left message on machine to call back  

## 2013-05-18 ENCOUNTER — Ambulatory Visit (INDEPENDENT_AMBULATORY_CARE_PROVIDER_SITE_OTHER): Payer: Medicaid Other | Admitting: Physician Assistant

## 2013-05-18 ENCOUNTER — Encounter: Payer: Self-pay | Admitting: Physician Assistant

## 2013-05-18 VITALS — BP 84/52 | HR 60 | Ht <= 58 in | Wt 116.5 lb

## 2013-05-18 DIAGNOSIS — K219 Gastro-esophageal reflux disease without esophagitis: Secondary | ICD-10-CM

## 2013-05-18 DIAGNOSIS — K3184 Gastroparesis: Secondary | ICD-10-CM

## 2013-05-18 DIAGNOSIS — R112 Nausea with vomiting, unspecified: Secondary | ICD-10-CM

## 2013-05-18 MED ORDER — NYSTATIN 100000 UNIT/ML MT SUSP: 5.0000 mL | Freq: Four times a day (QID) | OROMUCOSAL | Status: DC

## 2013-05-18 MED ORDER — OXYCODONE HCL 5 MG PO TABS: ORAL_TABLET | ORAL | Status: DC

## 2013-05-18 MED ORDER — ONDANSETRON 4 MG PO TBDP: 4.0000 mg | ORAL_TABLET | Freq: Four times a day (QID) | ORAL | Status: DC

## 2013-05-18 MED ORDER — PROMETHAZINE HCL 25 MG RE SUPP: 25.0000 mg | Freq: Four times a day (QID) | RECTAL | Status: DC | PRN

## 2013-05-18 NOTE — Progress Notes (Signed)
i agree with the plan above 

## 2013-05-18 NOTE — Patient Instructions (Addendum)
We sent refills to [facility] [address]/ 1. Zofran 2. Phenergan Suppositories 3. Mycostatin 4. Oxycodone  Make a follow up with Dr. Ardis Hughs for 8 weeks out.

## 2013-05-18 NOTE — Progress Notes (Signed)
Subjective:    Patient ID: Tina Watkins, female    DOB: 10/03/1965, 48 y.o.   MRN: [id number]  HPI  Psalm is a pleasant 48 year old African American female known to Dr. Ardis Hughs. She has multiple medical problems including end-stage renal disease she is status post renal transplant and is on chronic immunosuppressive. 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 She is known to have chronic GERD and mild gastroparesis documented on gastric emptying scan in 2010. She last had EGD in March of 2014 showed mild candida esophagitis and gastritis biopsies were done for H. pylori and were negative. She has episodes of recurrent nausea and vomiting which at times become intractable. She has only had 2 ER visits this year so far but had several last year for similar symptoms. She says she tries to manage her symptoms at home but if she goes for 24 hours without being able to keep anything down she has for the emergency room. Her last ER visit was on 05/07/2013. She said she had intractable dry heaves at that point. She was treated with IV fluids IV Reglan ,zofran,and pain control. She says her symptoms persisted for several days after that and she is gradually improving since she says she still feels nauseated but has not vomited over the past 3 days. No diarrhea. She continues to have upper abnormal soreness. She says she came back in here because she was worried she may have a recurrent candidiasis. She has also run out of pain medication and Zofran.   Review of Systems  Constitutional: Positive for appetite change and fatigue.  HENT: Negative.   Eyes: Negative.   Respiratory: Negative.   Cardiovascular: Negative.   Gastrointestinal: Positive for nausea, vomiting and abdominal pain.  Endocrine: Negative.   Musculoskeletal: Negative.   Skin: Negative.   Allergic/Immunologic:  Negative.   Hematological: Negative.    Outpatient Prescriptions Prior to Visit  Medication Sig Dispense Refill  . aspirin EC 81 MG tablet Take 81 mg by mouth daily.      [name] azaTHIOprine (IMURAN) 50 MG tablet Take 100 mg by mouth daily.       . calcitRIOL (ROCALTROL) 0.25 MCG capsule Take 0.25 mcg by mouth daily before breakfast.       . colchicine 0.6 MG tablet Take 0.6 mg by mouth daily as needed (For gout.).      [name] dicyclomine (BENTYL) 20 MG tablet Take 20 mg by mouth 2 (two) times daily as needed (for abdominal cramping).      . furosemide (LASIX) 40 MG tablet Take 40 mg by mouth every evening.       [name] HYDROcodone-acetaminophen (NORCO/VICODIN) 5-325 MG per tablet Take 2 tablets by mouth every 4 (four) hours as needed.  6 tablet  0  . Hydrocodone-Acetaminophen 7.5-300 MG TABS Take 1-2 tablets by mouth every 6 (six) hours as needed (as needed for Pain).  20 each  0  . metoCLOPramide (REGLAN) 5 MG tablet Take 5 mg by mouth 4 (four) times daily.      [name] omeprazole (PRILOSEC) 40 MG capsule Take 40 mg by mouth 2 (two) times daily.       . Oxycodone HCl 10 MG TABS Take 10 mg by mouth daily as needed (for pain).      . predniSONE (DELTASONE) 5 MG tablet Take 5 mg by mouth daily.      [name]  promethazine (PHENERGAN) 25 MG tablet Take 1 tablet (25 mg total) by mouth every 6 (six) hours as needed for nausea or vomiting.  12 tablet  0  . tacrolimus (PROGRAF) 1 MG capsule Take 3 mg by mouth 2 (two) times daily.       . ondansetron (ZOFRAN-ODT) 4 MG disintegrating tablet Take 4 mg by mouth every 4 (four) hours as needed for nausea.      . cephALEXin (KEFLEX) 500 MG capsule Take 1 capsule (500 mg total) by mouth 4 (four) times daily.  20 capsule  0  . promethazine (PHENERGAN) 25 MG tablet Take 25 mg by mouth every 6 (six) hours as needed for nausea.        No facility-administered medications prior to visit.   Allergies  Allergen Reactions  . Levofloxacin Itching and Rash       Patient Active Problem  List   Diagnosis Date Noted  . Chronic pain disorder 04/09/2012  . Dehydration, mild 04/09/2012  . Gout attack 06/23/2011  . Herpes infection 06/23/2011  . Anxiety 06/23/2011  . E coli bacteremia 06/18/2011  . ESRD (end stage renal disease) 06/12/2011  . UTI (lower urinary tract infection) 06/12/2011  . Bacteremia due to Gram-negative bacteria 05/23/2011  . History of renal transplantation 05/22/2011  . Septic shock(785.52) 05/22/2011  . Acute on chronic kidney failure 05/22/2011  . Gastroparesis 04/24/2008  . WEIGHT LOSS 08/24/2007  . NAUSEA AND VOMITING 08/24/2007   History  Substance Use Topics  . Smoking status: Current Some Day Smoker -- 0.50 packs/day for 28 years    Types: Cigarettes  . Smokeless tobacco: Never Used     Comment: had quit 03/05/2011  . Alcohol Use: Yes     Comment: uses daily   family history includes Hypertension in her maternal grandmother.  Objective:   Physical Exam well-developed female in no acute distress, pleasant blood pressure 84/52 pulse 60 height 4 foot 10 weight 116. HEENT ;nontraumatic normocephalic she is status post enucleation of the right eye, Supple no JVD oropharynx is clear with no obvious exudate, Cardiovascular; regular rate and rhythm with S1-S2 no murmur or gallop, Pulmonary; clear bilaterally, Abdomen; soft there is a palpable transplanted kidney in the right lower abdomen no palpable hepatosplenomegaly no focal tenderness no guarding or rebound bowel sounds are present, Rectal exam not done, Extremities; no clubbing cyanosis or edema skin warm and dry, Psych ;mood and affect appropriate        Assessment & Plan:  #32  48 year old female with history of end-stage renal disease now status post renal transplant on chronic  Immunosuppression with recurrent episodes of intractable nausea and vomiting last ER visit about 10 days ago. These have been the past been attributed to gastroparesis but her vomiting episodes are likely  multifactorial. #2 anxiety #3 GERD #4 chronic pain disorder  #5 status post remote CVA #6 status post enucleation of the right eye  Plan; refill Zofran ODT 4 mg every 6-8 hours when necessary nausea and vomiting We discussed Phenergan suppositories for home use to try to overt ER visits this has been refilled as well 25 mg per rectum every 6 hours when necessary severe nausea and vomiting She has been on Reglan and generally alternates 5 mg with 10 mg every 6 hours she will continue this Continue omeprazole 20 mg by mouth twice daily No definite thrush but certainly at increased risk will  Give her Mycostatin oral suspension 5 cc 4 times daily x10 days Patient asks  for pain medication for when necessary use and is given a small prescription for oxycodone 5 mg every 6 hours when necessary pain #30 no refills Followup with Dr. Ardis Hughs in 2 months

## 2013-05-26 ENCOUNTER — Encounter (HOSPITAL_COMMUNITY): Payer: Self-pay | Admitting: Emergency Medicine

## 2013-05-26 ENCOUNTER — Emergency Department (HOSPITAL_COMMUNITY): Payer: Medicaid Other

## 2013-05-26 ENCOUNTER — Emergency Department (HOSPITAL_COMMUNITY)
Admission: EM | Admit: 2013-05-26 | Discharge: 2013-05-26 | Disposition: A | Discharge status: Home / Self Care | Payer: Medicaid Other | Attending: Emergency Medicine | Admitting: Emergency Medicine

## 2013-05-26 DIAGNOSIS — Z8673 Personal history of transient ischemic attack (TIA), and cerebral infarction without residual deficits: Secondary | ICD-10-CM | POA: Insufficient documentation

## 2013-05-26 DIAGNOSIS — Z862 Personal history of diseases of the blood and blood-forming organs and certain disorders involving the immune mechanism: Secondary | ICD-10-CM | POA: Insufficient documentation

## 2013-05-26 DIAGNOSIS — Z8669 Personal history of other diseases of the nervous system and sense organs: Secondary | ICD-10-CM | POA: Insufficient documentation

## 2013-05-26 DIAGNOSIS — R197 Diarrhea, unspecified: Secondary | ICD-10-CM | POA: Insufficient documentation

## 2013-05-26 DIAGNOSIS — N186 End stage renal disease: Secondary | ICD-10-CM | POA: Insufficient documentation

## 2013-05-26 DIAGNOSIS — Z8659 Personal history of other mental and behavioral disorders: Secondary | ICD-10-CM | POA: Insufficient documentation

## 2013-05-26 DIAGNOSIS — B9789 Other viral agents as the cause of diseases classified elsewhere: Secondary | ICD-10-CM | POA: Insufficient documentation

## 2013-05-26 DIAGNOSIS — B349 Viral infection, unspecified: Secondary | ICD-10-CM

## 2013-05-26 DIAGNOSIS — IMO0002 Reserved for concepts with insufficient information to code with codable children: Secondary | ICD-10-CM | POA: Insufficient documentation

## 2013-05-26 DIAGNOSIS — M109 Gout, unspecified: Secondary | ICD-10-CM | POA: Insufficient documentation

## 2013-05-26 DIAGNOSIS — K219 Gastro-esophageal reflux disease without esophagitis: Secondary | ICD-10-CM | POA: Insufficient documentation

## 2013-05-26 DIAGNOSIS — J069 Acute upper respiratory infection, unspecified: Secondary | ICD-10-CM | POA: Insufficient documentation

## 2013-05-26 DIAGNOSIS — Z8619 Personal history of other infectious and parasitic diseases: Secondary | ICD-10-CM | POA: Insufficient documentation

## 2013-05-26 DIAGNOSIS — F17200 Nicotine dependence, unspecified, uncomplicated: Secondary | ICD-10-CM | POA: Insufficient documentation

## 2013-05-26 DIAGNOSIS — Z3202 Encounter for pregnancy test, result negative: Secondary | ICD-10-CM | POA: Insufficient documentation

## 2013-05-26 DIAGNOSIS — Z7982 Long term (current) use of aspirin: Secondary | ICD-10-CM | POA: Insufficient documentation

## 2013-05-26 DIAGNOSIS — G8929 Other chronic pain: Secondary | ICD-10-CM | POA: Insufficient documentation

## 2013-05-26 DIAGNOSIS — Z79899 Other long term (current) drug therapy: Secondary | ICD-10-CM | POA: Insufficient documentation

## 2013-05-26 DIAGNOSIS — Z8739 Personal history of other diseases of the musculoskeletal system and connective tissue: Secondary | ICD-10-CM | POA: Insufficient documentation

## 2013-05-26 LAB — COMPREHENSIVE METABOLIC PANEL
ALT: 10 U/L (ref 0–35)
AST: 20 U/L (ref 0–37)
Albumin: 4.2 g/dL (ref 3.5–5.2)
Alkaline Phosphatase: 82 U/L (ref 39–117)
BUN: 29 mg/dL — ABNORMAL HIGH (ref 6–23)
CO2: 23 mEq/L (ref 19–32)
Calcium: 9.9 mg/dL (ref 8.4–10.5)
Chloride: 100 mEq/L (ref 96–112)
Creatinine, Ser: 2.4 mg/dL — ABNORMAL HIGH (ref 0.50–1.10)
GFR calc Af Amer: 27 mL/min — ABNORMAL LOW (ref >90)
GFR calc non Af Amer: 23 mL/min — ABNORMAL LOW (ref >90)
Glucose, Bld: 86 mg/dL (ref 70–99)
Potassium: 3.7 mEq/L (ref 3.7–5.3)
Sodium: 140 mEq/L (ref 137–147)
Total Bilirubin: 0.4 mg/dL (ref 0.3–1.2)
Total Protein: 7.2 g/dL (ref 6.0–8.3)

## 2013-05-26 LAB — URINALYSIS, ROUTINE W REFLEX MICROSCOPIC
Bilirubin Urine: NEGATIVE
Glucose, UA: NEGATIVE mg/dL
Hgb urine dipstick: NEGATIVE
Ketones, ur: NEGATIVE mg/dL
Leukocytes, UA: NEGATIVE
Nitrite: NEGATIVE
Protein, ur: 30 mg/dL — AB
Specific Gravity, Urine: 1.015 (ref 1.005–1.030)
Urobilinogen, UA: 0.2 mg/dL (ref 0.0–1.0)
pH: 5.5 (ref 5.0–8.0)

## 2013-05-26 LAB — URINE MICROSCOPIC-ADD ON

## 2013-05-26 LAB — CBC WITH DIFFERENTIAL/PLATELET
Basophils Absolute: 0 10*3/uL (ref 0.0–0.1)
Basophils Relative: 0 % (ref 0–1)
Eosinophils Absolute: 0.1 10*3/uL (ref 0.0–0.7)
Eosinophils Relative: 2 % (ref 0–5)
HCT: 32.6 % — ABNORMAL LOW (ref 36.0–46.0)
Hemoglobin: 11 g/dL — ABNORMAL LOW (ref 12.0–15.0)
Lymphocytes Relative: 22 % (ref 12–46)
Lymphs Abs: 0.6 10*3/uL — ABNORMAL LOW (ref 0.7–4.0)
MCH: 32 pg (ref 26.0–34.0)
MCHC: 33.7 g/dL (ref 30.0–36.0)
MCV: 94.8 fL (ref 78.0–100.0)
Monocytes Absolute: 0.3 10*3/uL (ref 0.1–1.0)
Monocytes Relative: 11 % (ref 3–12)
Neutro Abs: 1.9 10*3/uL (ref 1.7–7.7)
Neutrophils Relative %: 65 % (ref 43–77)
Platelets: 171 10*3/uL (ref 150–400)
RBC: 3.44 MIL/uL — ABNORMAL LOW (ref 3.87–5.11)
RDW: 20.4 % — ABNORMAL HIGH (ref 11.5–15.5)
WBC: 2.9 10*3/uL — ABNORMAL LOW (ref 4.0–10.5)

## 2013-05-26 LAB — PREGNANCY, URINE: Preg Test, Ur: NEGATIVE

## 2013-05-26 LAB — I-STAT TROPONIN, ED: Troponin i, poc: 0.01 ng/mL (ref 0.00–0.08)

## 2013-05-26 MED ORDER — ACETAMINOPHEN 325 MG PO TABS
650.0000 mg | ORAL_TABLET | Freq: Once | ORAL | Status: AC
  Administered 2013-05-26: 650 mg via ORAL
  Filled 2013-05-26: qty 2

## 2013-05-26 NOTE — ED Provider Notes (Signed)
CSN: [id number]     Arrival date & time 05/26/13  1358 History   First MD Initiated Contact with Patient 05/26/13 1501     Chief Complaint  Patient presents with  . Diarrhea     (Consider location/radiation/quality/duration/timing/severity/associated sxs/prior Treatment) HPI 48 year old female with a history of multiple renal transplants presents with diarrhea over the last 3 days. She states that a week ago her significant other was admitted for C. difficile and passed away 4 days ago. She noticed watery stools since then. They're worse the first day but progressively improved but she still had 4 bowel movements today. Denies abdominal pain or bloating. No vomiting. Her last 3 days she's also noticed productive cough, left anterior chest pain and dyspnea. She states she had a fever up to 99.8 today. Should not take anything for her fever. No higher temperatures. States the chest pain has been constant over the last 2 days and feels like a pressure and intermittent sharp pain. Denies any urinary symptoms. About one month ago she did take one days worth of Keflex for an eye infection but wasn't told to stop by her ophthalmologist. She's otherwise not had any antibiotics. She's not had any bloody stools.  Past Medical History  Diagnosis Date  . Gastroparesis   . Gout   . Hypotension   . Herpes genitalia 1994  . E coli bacteremia 06/18/2011  . Bacteremia due to Gram-negative bacteria 05/23/2011  . History of blood transfusion     "more than a few times" (12/11/2011)  . History of stomach ulcers   . Stroke      left basal ganglia lacunar infarct; Right frontal lobe lacunar infarct.  . Stroke ~ 1999; 2001    "briefly lost my vision; lost my right eye" (12/11/2011)  . Chronic lower back pain   . Depression   . Osteopenia   . Headache(784.0)     "not often anymore" (12/11/2011)  . Complication of anesthesia     "woke up during OR; I have an extremely high tolerance" (12/11/2011)  . Dysrhythmia      "tachycardia" (12/11/2011)  . ESRD (end stage renal disease) 06/12/2011  . Iron deficiency anemia   . Seizures 1994    "post transplant; only have had that one" (12/11/2011)  . GERD (gastroesophageal reflux disease)    Past Surgical History  Procedure Laterality Date  . Kidney transplant  1994; 1999; 2005    "right"  . Appendectomy  ~ 2004  . 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 Total nephrectomy  1988?; 1994; 2005  . Multiple tooth extractions    . Enucleation  2001    "right"  . Esophagogastroduodenoscopy (egd) with propofol N/A 04/21/2012    Procedure: ESOPHAGOGASTRODUODENOSCOPY (EGD) WITH PROPOFOL;  Surgeon: Milus Banister, MD;  Location: [facility];  Service: Endoscopy;  Laterality: N/A;   Family History  Problem Relation Age of Onset  . Hypertension Maternal Grandmother    History  Substance Use Topics  . Smoking status: Current Some Day Smoker -- 0.50 packs/day for 28 years    Types: Cigarettes  . Smokeless tobacco: Never Used     Comment: had quit 03/05/2011  . Alcohol Use: Yes     Comment: uses daily   OB History   Grav Para Term Preterm Abortions TAB SAB Ect Mult Living  Review of Systems  Constitutional: Positive for fever (99.8 max) and chills.  Respiratory: Positive for cough.   Cardiovascular: Positive for chest pain.  Gastrointestinal: Positive for diarrhea. Negative for nausea, vomiting, abdominal pain and blood in stool.  Genitourinary: Negative for dysuria.  Musculoskeletal: Positive for myalgias (diffuse muscle aches).  Neurological: Positive for headaches.  All other systems reviewed and are negative.     Allergies  Levofloxacin  Home Medications   Prior to Admission medications   Medication Sig Start Date End Date Taking? Authorizing Provider  aspirin EC 81 MG tablet Take 81 mg by mouth daily.   Yes Historical Provider, MD  azaTHIOprine (IMURAN) 50 MG  tablet Take 100 mg by mouth daily.    Yes Historical Provider, MD  calcitRIOL (ROCALTROL) 0.25 MCG capsule Take 0.25 mcg by mouth daily before breakfast.    Yes Historical Provider, MD  colchicine 0.6 MG tablet Take 0.6 mg by mouth daily as needed (For gout.).   Yes Historical Provider, MD  Darbepoetin Alfa-Albumin (ARANESP IJ) Inject as directed as directed.   Yes Historical Provider, MD  dicyclomine (BENTYL) 20 MG tablet Take 20 mg by mouth 2 (two) times daily as needed (for abdominal cramping).   Yes Historical Provider, MD  furosemide (LASIX) 40 MG tablet Take 40 mg by mouth every evening.  06/22/11  Yes [name], MD  metoCLOPramide (REGLAN) 5 MG tablet Take 5 mg by mouth 4 (four) times daily.   Yes Historical Provider, MD  Multiple Vitamin (MULTIVITAMIN) tablet Take 1 tablet by mouth daily.   Yes Historical Provider, MD  nystatin (MYCOSTATIN) 100000 UNIT/ML suspension Take 5 mLs (500,000 Units total) by mouth 4 (four) times daily. 05/18/13  Yes Amy S Esterwood, PA-C  omeprazole (PRILOSEC) 40 MG capsule Take 40 mg by mouth 2 (two) times daily.    Yes Historical Provider, MD  oxyCODONE (ROXICODONE) 5 MG immediate release tablet Take 1 tab daily as needed for pain. 05/18/13  Yes Amy S Esterwood, PA-C  oxyCODONE-acetaminophen (PERCOCET) 7.5-325 MG per tablet Take 1 tablet by mouth every 4 (four) hours as needed for pain.   Yes Historical Provider, MD  predniSONE (DELTASONE) 5 MG tablet Take 5 mg by mouth daily. 06/22/11  Yes [name], MD  promethazine (PHENERGAN) 25 MG suppository Place 1 suppository (25 mg total) rectally every 6 (six) hours as needed for nausea or vomiting. 05/18/13  Yes Amy S Esterwood, PA-C  promethazine (PHENERGAN) 25 MG tablet Take 1 tablet (25 mg total) by mouth every 6 (six) hours as needed for nausea or vomiting. 05/07/13  Yes Margarita Mail, PA-C  tacrolimus (PROGRAF) 1 MG capsule Take 3 mg by mouth 2 (two) times daily.  06/22/11  Yes [name], MD   ondansetron (ZOFRAN-ODT) 4 MG disintegrating tablet Take 1 tablet (4 mg total) by mouth every 6 (six) hours. 05/18/13   Amy S Esterwood, PA-C   BP 96/56  Pulse 97  Temp(Src) 98.7 F (37.1 C) (Oral)  Resp 18  Ht 4\' 11"  (1.499 m)  Wt 120 lb (54.432 kg)  BMI 24.22 kg/m2  SpO2 95% Physical Exam  Nursing note and vitals reviewed. Constitutional: She is oriented to person, place, and time. She appears well-developed and well-nourished. No distress.  HENT:  Head: Normocephalic and atraumatic.  Right Ear: External ear normal.  Left Ear: External ear normal.  Nose: Nose normal.  Eyes: Right eye exhibits no discharge. Left eye exhibits no discharge.  Cardiovascular: Normal rate, regular rhythm and  normal heart sounds.   Pulmonary/Chest: Effort normal and breath sounds normal. She has no wheezes. She has no rales. She exhibits tenderness (left anterior chest wall tenderness).  Abdominal: Soft. She exhibits no distension. There is no tenderness.  No tenderness over her kidney transplant in lower right abdomen  Neurological: She is alert and oriented to person, place, and time.  Skin: Skin is warm and dry.    ED Course  Procedures (including critical care time) Labs Review Labs Reviewed  CBC WITH DIFFERENTIAL - Abnormal; Notable for the following:    WBC 2.9 (*)    RBC 3.44 (*)    Hemoglobin 11.0 (*)    HCT 32.6 (*)    RDW 20.4 (*)    Lymphs Abs 0.6 (*)    All other components within normal limits  COMPREHENSIVE METABOLIC PANEL - Abnormal; Notable for the following:    BUN 29 (*)    Creatinine, Ser 2.40 (*)    GFR calc non Af Amer 23 (*)    GFR calc Af Amer 27 (*)    All other components within normal limits  URINALYSIS, ROUTINE W REFLEX MICROSCOPIC - Abnormal; Notable for the following:    Protein, ur 30 (*)    All other components within normal limits  URINE MICROSCOPIC-ADD ON - Abnormal; Notable for the following:    Bacteria, UA FEW (*)    Casts GRANULAR CAST (*)    All  other components within normal limits  CLOSTRIDIUM DIFFICILE BY PCR  PREGNANCY, URINE  I-STAT TROPOININ, ED    Imaging Review Dg Chest 2 View  05/26/2013   CLINICAL DATA:  Productive cough. Chest pain and left-sided heaviness. Shortness of breath.  EXAM: CHEST  2 VIEW  COMPARISON:  09/13/2012  FINDINGS: A stent projects over the SVC, unchanged. Cardiomediastinal silhouette is unchanged. Elevation of the right hemidiaphragm is unchanged. The lungs are well inflated and clear. No pleural effusion or pneumothorax is identified. Numerous surgical clips are again seen in the upper abdomen. No acute osseous abnormality is seen.  IMPRESSION: No evidence of acute airspace disease.   Electronically Signed   By: Logan Bores   On: 05/26/2013 16:28     EKG Interpretation   Date/Time:  Friday May 26 2013 14:06:07 EDT Ventricular Rate:  81 PR Interval:  146 QRS Duration: 56 QT Interval:  394 QTC Calculation: 457 R Axis:   71 Text Interpretation:  Normal sinus rhythm Nonspecific ST abnormality  Abnormal ECG no significant change from 03/27/13 Confirmed by Belisa Eichholz  MD,  Walterine Amodei ([id number]) on 05/26/2013 3:03:48 PM      MDM   Final diagnoses:  Viral syndrome  Diarrhea  Upper respiratory infection    The patient has not had any bowel movements while in the ER, as been at least over 3 hours. She has no abdominal bloating, pain, or vomiting. She has been in contact with C Diff at this point she's not having objective signs. She's not been able to provide a sample do not feel empiric antibiotic her warranted as she is otherwise well appearing. She also seems to have symptoms consistent with an upper respiratory infection. Her viral syndrome may be contributing to her diarrhea as well. We'll give her a stool sample container that she take her primary doctor, and have counseled her on return precautions such as bloody stools, worsening stools, abdominal pain high fever, or other concerning symptoms. There are  no signs of bacterial infection at this time I feel she is  safe for discharge.    Ephraim Hamburger, MD 05/26/13 1723

## 2013-05-26 NOTE — ED Notes (Signed)
Pt here for c diff exposure, pt is a renal transplant pt was visitng a pt here with c diff and sts that she didn't dress out appropriately now reports symptoms of fever, diarrhea, chest pain

## 2013-06-02 ENCOUNTER — Encounter (HOSPITAL_COMMUNITY)
Admission: RE | Admit: 2013-06-02 | Discharge: 2013-06-02 | Disposition: A | Discharge status: Home / Self Care | Payer: Medicaid Other | Source: Ambulatory Visit | Attending: Nephrology | Admitting: Nephrology

## 2013-06-02 DIAGNOSIS — N183 Chronic kidney disease, stage 3 unspecified: Secondary | ICD-10-CM | POA: Insufficient documentation

## 2013-06-02 DIAGNOSIS — D638 Anemia in other chronic diseases classified elsewhere: Secondary | ICD-10-CM | POA: Insufficient documentation

## 2013-06-02 LAB — RENAL FUNCTION PANEL
Albumin: 4.1 g/dL (ref 3.5–5.2)
BUN: 32 mg/dL — ABNORMAL HIGH (ref 6–23)
CO2: 21 mEq/L (ref 19–32)
Calcium: 10 mg/dL (ref 8.4–10.5)
Chloride: 105 mEq/L (ref 96–112)
Creatinine, Ser: 2.83 mg/dL — ABNORMAL HIGH (ref 0.50–1.10)
GFR calc Af Amer: 22 mL/min — ABNORMAL LOW (ref >90)
GFR calc non Af Amer: 19 mL/min — ABNORMAL LOW (ref >90)
Glucose, Bld: 82 mg/dL (ref 70–99)
Phosphorus: 3.3 mg/dL (ref 2.3–4.6)
Potassium: 4.1 mEq/L (ref 3.7–5.3)
Sodium: 143 mEq/L (ref 137–147)

## 2013-06-02 LAB — POCT HEMOGLOBIN-HEMACUE: Hemoglobin: 10.3 g/dL — ABNORMAL LOW (ref 12.0–15.0)

## 2013-06-02 MED ORDER — EPOETIN ALFA 40000 UNIT/ML IJ SOLN
INTRAMUSCULAR | Status: AC
  Administered 2013-06-02: 40000 [IU] via SUBCUTANEOUS
  Filled 2013-06-02: qty 1

## 2013-06-02 MED ORDER — EPOETIN ALFA 20000 UNIT/ML IJ SOLN: 40000.0000 [IU] | INTRAMUSCULAR | Status: DC

## 2013-06-09 ENCOUNTER — Other Ambulatory Visit: Payer: Self-pay | Admitting: Gastroenterology

## 2013-07-07 ENCOUNTER — Encounter (HOSPITAL_COMMUNITY)
Admission: RE | Admit: 2013-07-07 | Discharge: 2013-07-07 | Disposition: A | Discharge status: Home / Self Care | Payer: Medicaid Other | Source: Ambulatory Visit | Attending: Nephrology | Admitting: Nephrology

## 2013-07-07 DIAGNOSIS — N183 Chronic kidney disease, stage 3 unspecified: Secondary | ICD-10-CM | POA: Insufficient documentation

## 2013-07-07 DIAGNOSIS — D638 Anemia in other chronic diseases classified elsewhere: Secondary | ICD-10-CM | POA: Insufficient documentation

## 2013-07-07 LAB — RENAL FUNCTION PANEL
Albumin: 3.9 g/dL (ref 3.5–5.2)
BUN: 27 mg/dL — ABNORMAL HIGH (ref 6–23)
CO2: 25 mEq/L (ref 19–32)
Calcium: 10 mg/dL (ref 8.4–10.5)
Chloride: 105 mEq/L (ref 96–112)
Creatinine, Ser: 2.26 mg/dL — ABNORMAL HIGH (ref 0.50–1.10)
GFR calc Af Amer: 29 mL/min — ABNORMAL LOW (ref >90)
GFR calc non Af Amer: 25 mL/min — ABNORMAL LOW (ref >90)
Glucose, Bld: 69 mg/dL — ABNORMAL LOW (ref 70–99)
Phosphorus: 2.6 mg/dL (ref 2.3–4.6)
Potassium: 4.3 mEq/L (ref 3.7–5.3)
Sodium: 144 mEq/L (ref 137–147)

## 2013-07-07 LAB — POCT HEMOGLOBIN-HEMACUE: Hemoglobin: 8.8 g/dL — ABNORMAL LOW (ref 12.0–15.0)

## 2013-07-07 MED ORDER — EPOETIN ALFA 40000 UNIT/ML IJ SOLN
INTRAMUSCULAR | Status: AC
  Administered 2013-07-07: 40000 [IU] via SUBCUTANEOUS
  Filled 2013-07-07: qty 1

## 2013-07-07 MED ORDER — EPOETIN ALFA 20000 UNIT/ML IJ SOLN: 40000.0000 [IU] | INTRAMUSCULAR | Status: DC

## 2013-07-08 LAB — IRON AND TIBC
Iron: 111 ug/dL (ref 42–135)
Saturation Ratios: 66 % — ABNORMAL HIGH (ref 20–55)
TIBC: 167 ug/dL — ABNORMAL LOW (ref 250–470)
UIBC: 56 ug/dL — ABNORMAL LOW (ref 125–400)

## 2013-07-08 LAB — FERRITIN: Ferritin: 1537 ng/mL — ABNORMAL HIGH (ref 10–291)

## 2013-07-09 ENCOUNTER — Other Ambulatory Visit: Payer: Self-pay | Admitting: Gastroenterology

## 2013-07-11 ENCOUNTER — Telehealth: Payer: Self-pay | Admitting: Gastroenterology

## 2013-07-12 ENCOUNTER — Other Ambulatory Visit: Payer: Self-pay

## 2013-07-12 DIAGNOSIS — Z1231 Encounter for screening mammogram for malignant neoplasm of breast: Secondary | ICD-10-CM

## 2013-07-12 MED ORDER — METOCLOPRAMIDE HCL 5 MG PO TABS: ORAL_TABLET | ORAL | Status: DC

## 2013-07-12 NOTE — Telephone Encounter (Signed)
Pt is aware she must keep appt for further refills

## 2013-08-03 ENCOUNTER — Encounter (HOSPITAL_COMMUNITY): Payer: Medicaid Other

## 2013-08-07 ENCOUNTER — Encounter (HOSPITAL_COMMUNITY)
Admission: RE | Admit: 2013-08-07 | Discharge: 2013-08-07 | Disposition: A | Discharge status: Home / Self Care | Payer: Medicaid Other | Source: Ambulatory Visit | Attending: Nephrology | Admitting: Nephrology

## 2013-08-07 DIAGNOSIS — N183 Chronic kidney disease, stage 3 unspecified: Secondary | ICD-10-CM | POA: Diagnosis not present

## 2013-08-07 DIAGNOSIS — D638 Anemia in other chronic diseases classified elsewhere: Secondary | ICD-10-CM | POA: Insufficient documentation

## 2013-08-07 LAB — RENAL FUNCTION PANEL
Albumin: 4.1 g/dL (ref 3.5–5.2)
Anion gap: 15 (ref 5–15)
BUN: 36 mg/dL — ABNORMAL HIGH (ref 6–23)
CO2: 23 mEq/L (ref 19–32)
Calcium: 10 mg/dL (ref 8.4–10.5)
Chloride: 104 mEq/L (ref 96–112)
Creatinine, Ser: 2.38 mg/dL — ABNORMAL HIGH (ref 0.50–1.10)
GFR calc Af Amer: 27 mL/min — ABNORMAL LOW (ref >90)
GFR calc non Af Amer: 23 mL/min — ABNORMAL LOW (ref >90)
Glucose, Bld: 80 mg/dL (ref 70–99)
Phosphorus: 2.8 mg/dL (ref 2.3–4.6)
Potassium: 4.1 mEq/L (ref 3.7–5.3)
Sodium: 142 mEq/L (ref 137–147)

## 2013-08-07 LAB — POCT HEMOGLOBIN-HEMACUE: Hemoglobin: 9.5 g/dL — ABNORMAL LOW (ref 12.0–15.0)

## 2013-08-07 MED ORDER — EPOETIN ALFA 20000 UNIT/ML IJ SOLN: 40000.0000 [IU] | INTRAMUSCULAR | Status: DC

## 2013-08-07 MED ORDER — EPOETIN ALFA 40000 UNIT/ML IJ SOLN
INTRAMUSCULAR | Status: AC
  Administered 2013-08-07: 40000 [IU] via SUBCUTANEOUS
  Filled 2013-08-07: qty 1

## 2013-08-08 IMAGING — CR DG CHEST 2V
2 series · 2 of 2 positions shown · non-contrast
Comparison: 02/19/2011 and earlier.

CLINICAL DATA: 45-year-old female with nausea vomiting fever, back
pain, cough, tachycardia, dialysis.

CHEST - 2 VIEW

[w chest pa]
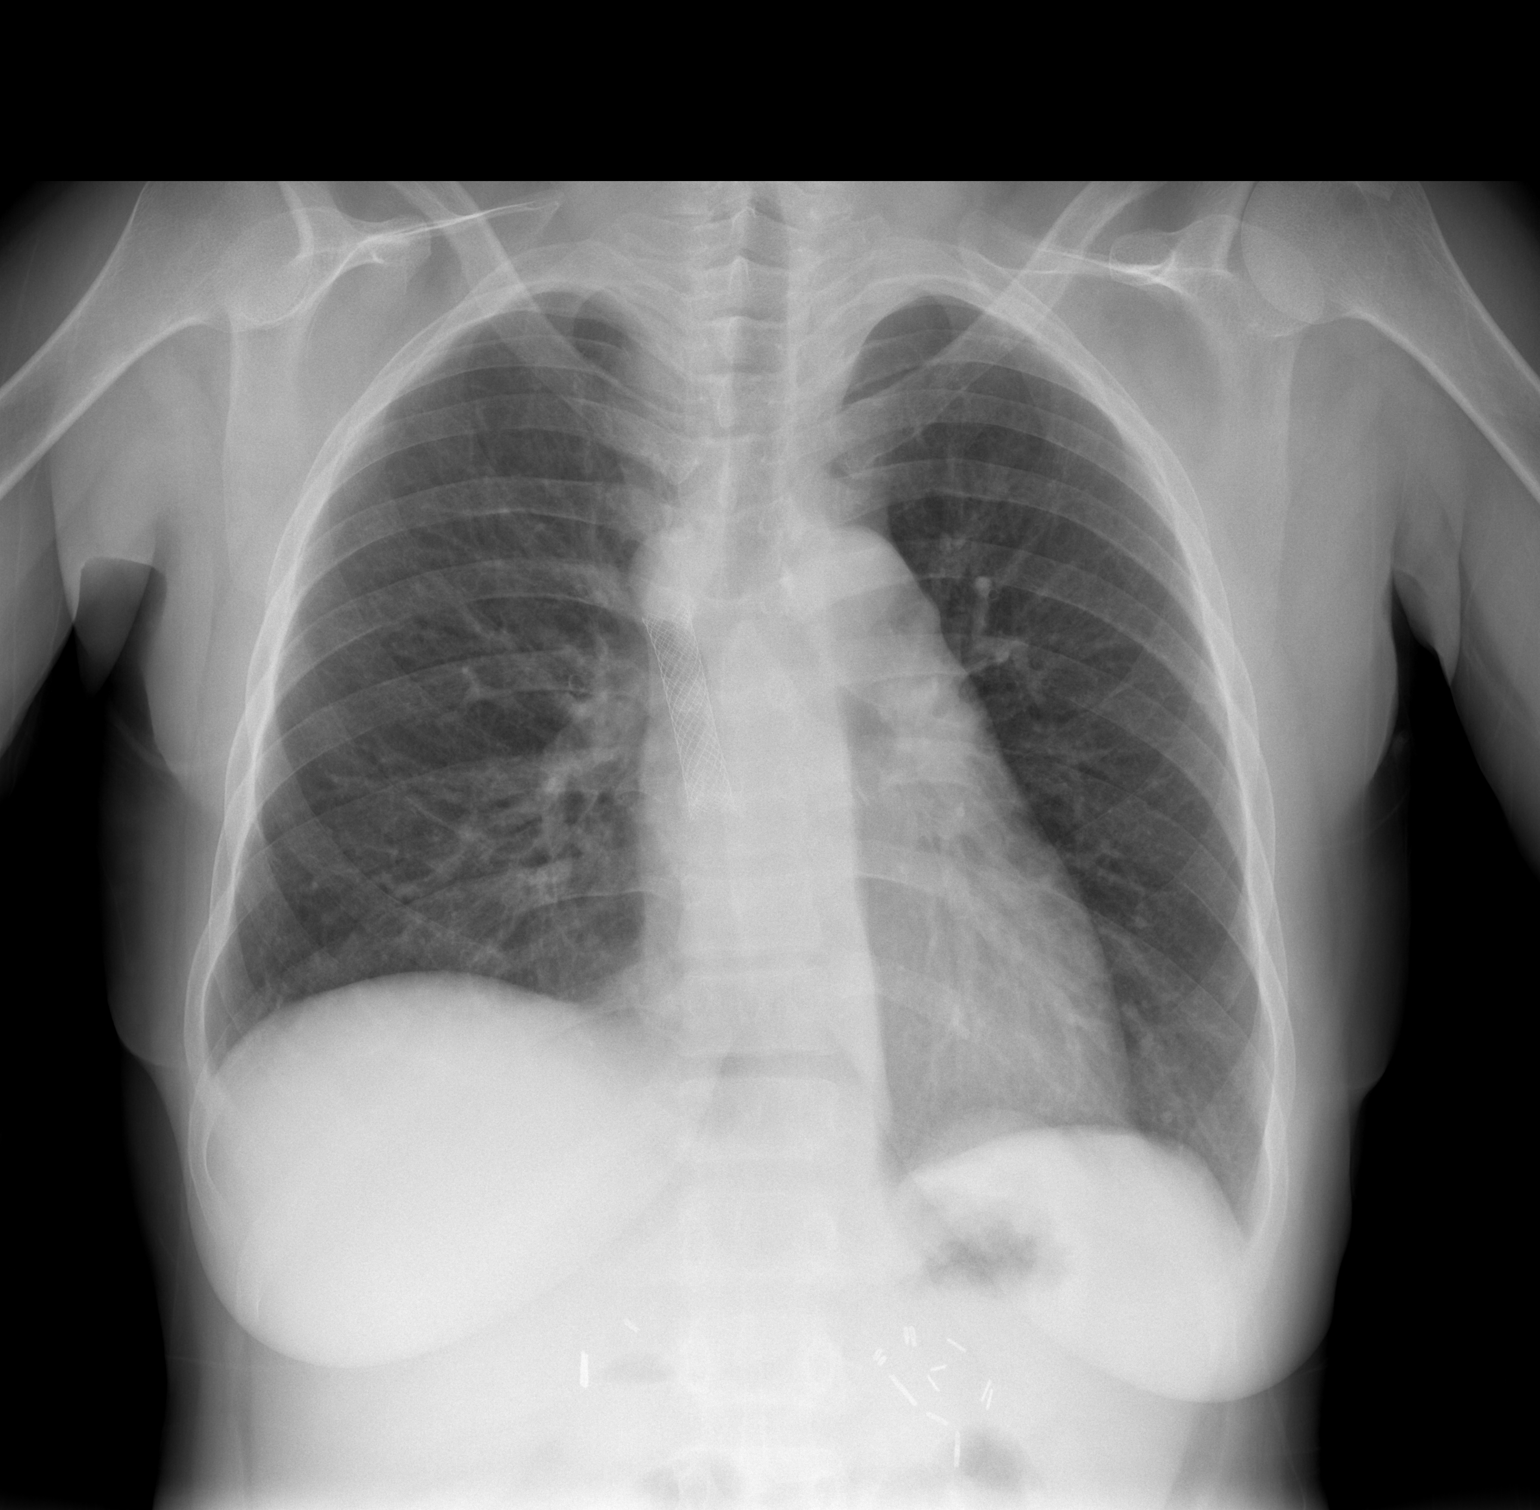

[w chest lat]
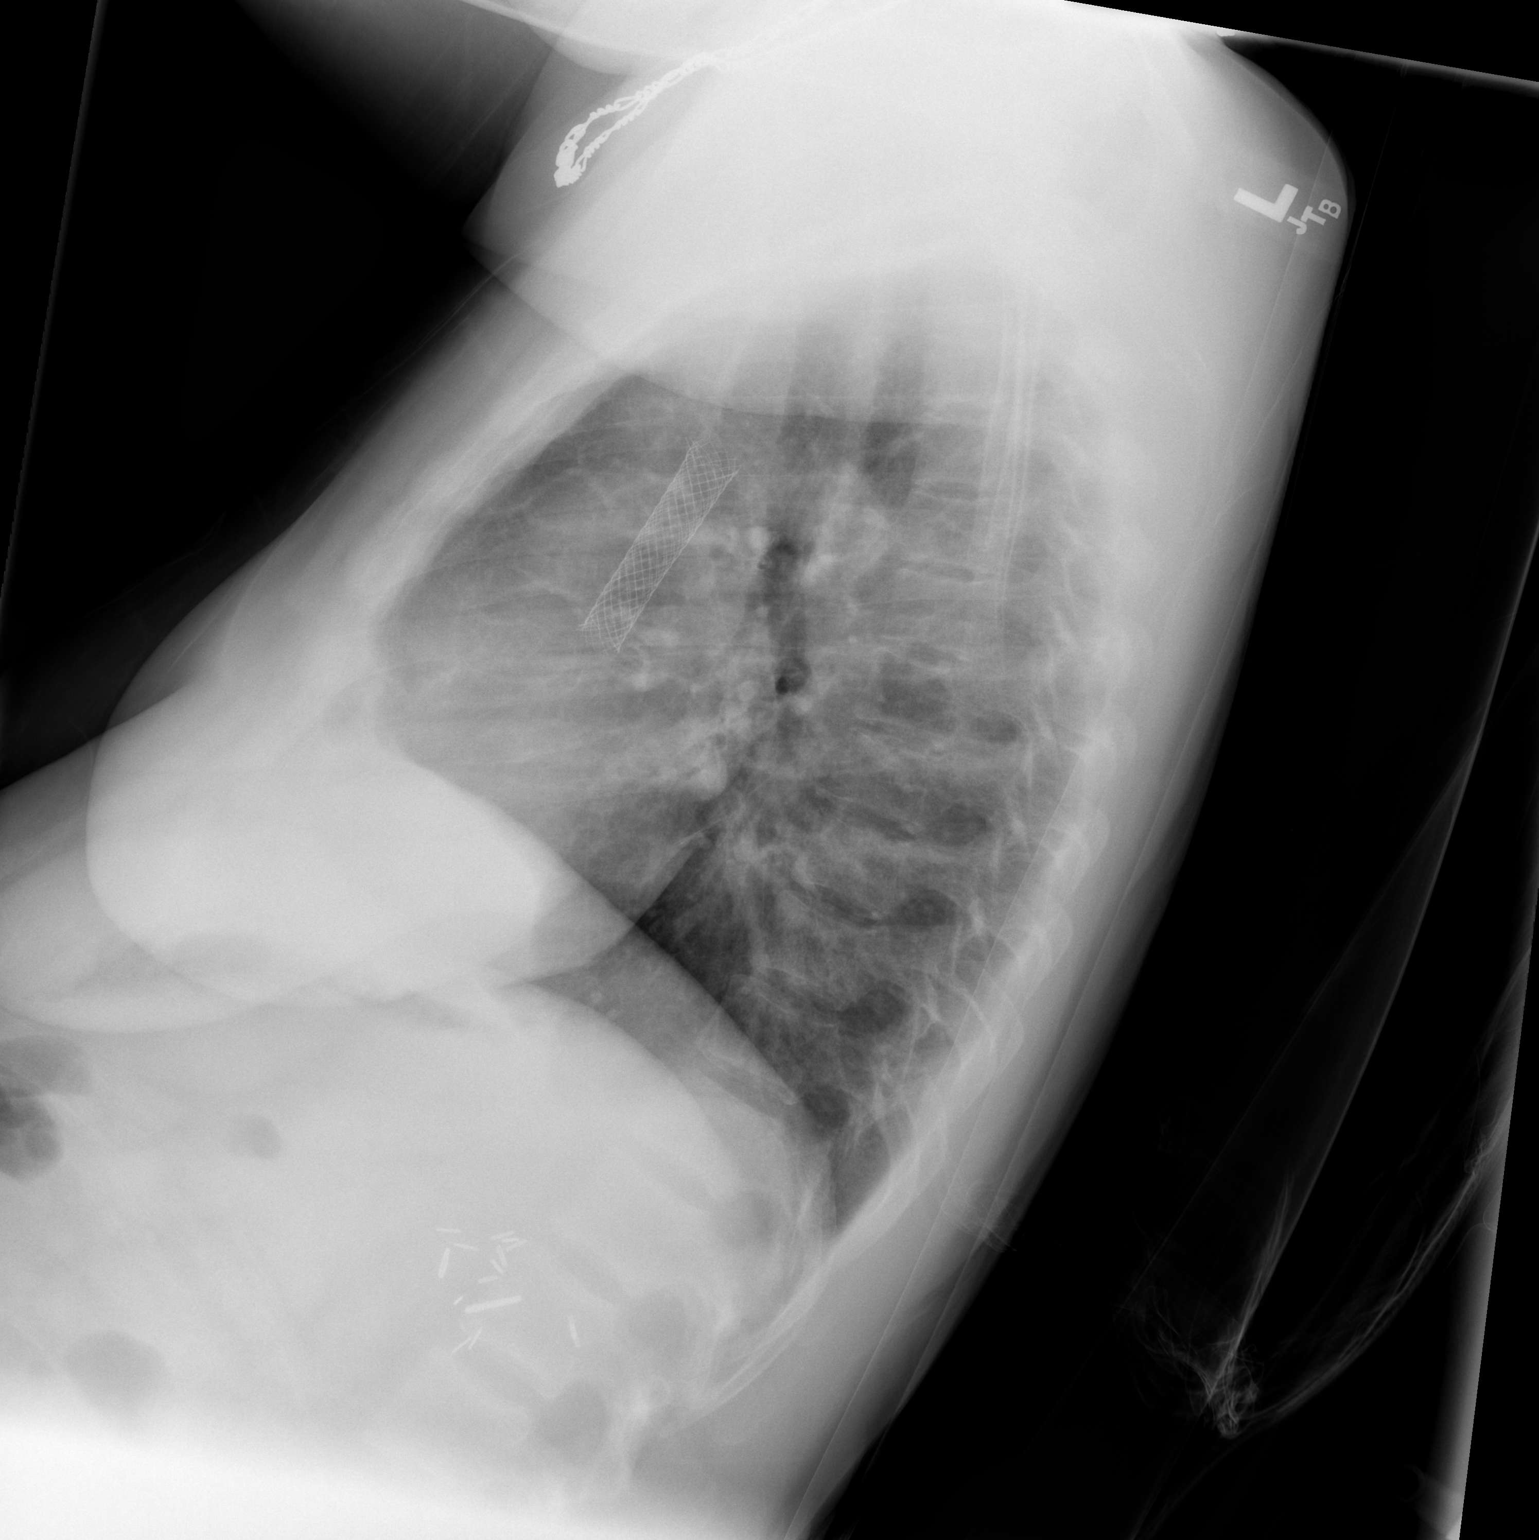

[2 of 2 positions shown; findings below may reference images not displayed]

FINDINGS: Stable mediastinal metallic stent, may be within the SVC.
Stable cardiac size and mediastinal contours.  Stable lung volumes.
No pneumothorax or pneumoperitoneum.  No pulmonary edema, pleural
effusion or acute pulmonary opacity.  Stable epigastric surgical
clips.  Paucity of bowel gas. No acute osseous abnormality
identified.
IMPRESSION: No acute cardiopulmonary abnormality.

## 2013-08-08 IMAGING — CR DG CHEST 1V PORT
1 series · 1 of 1 positions shown · non-contrast
Comparison: Two-view chest 05/22/2011.

CLINICAL DATA: Status post failed central line attempt.

PORTABLE CHEST - 1 VIEW

[AP]
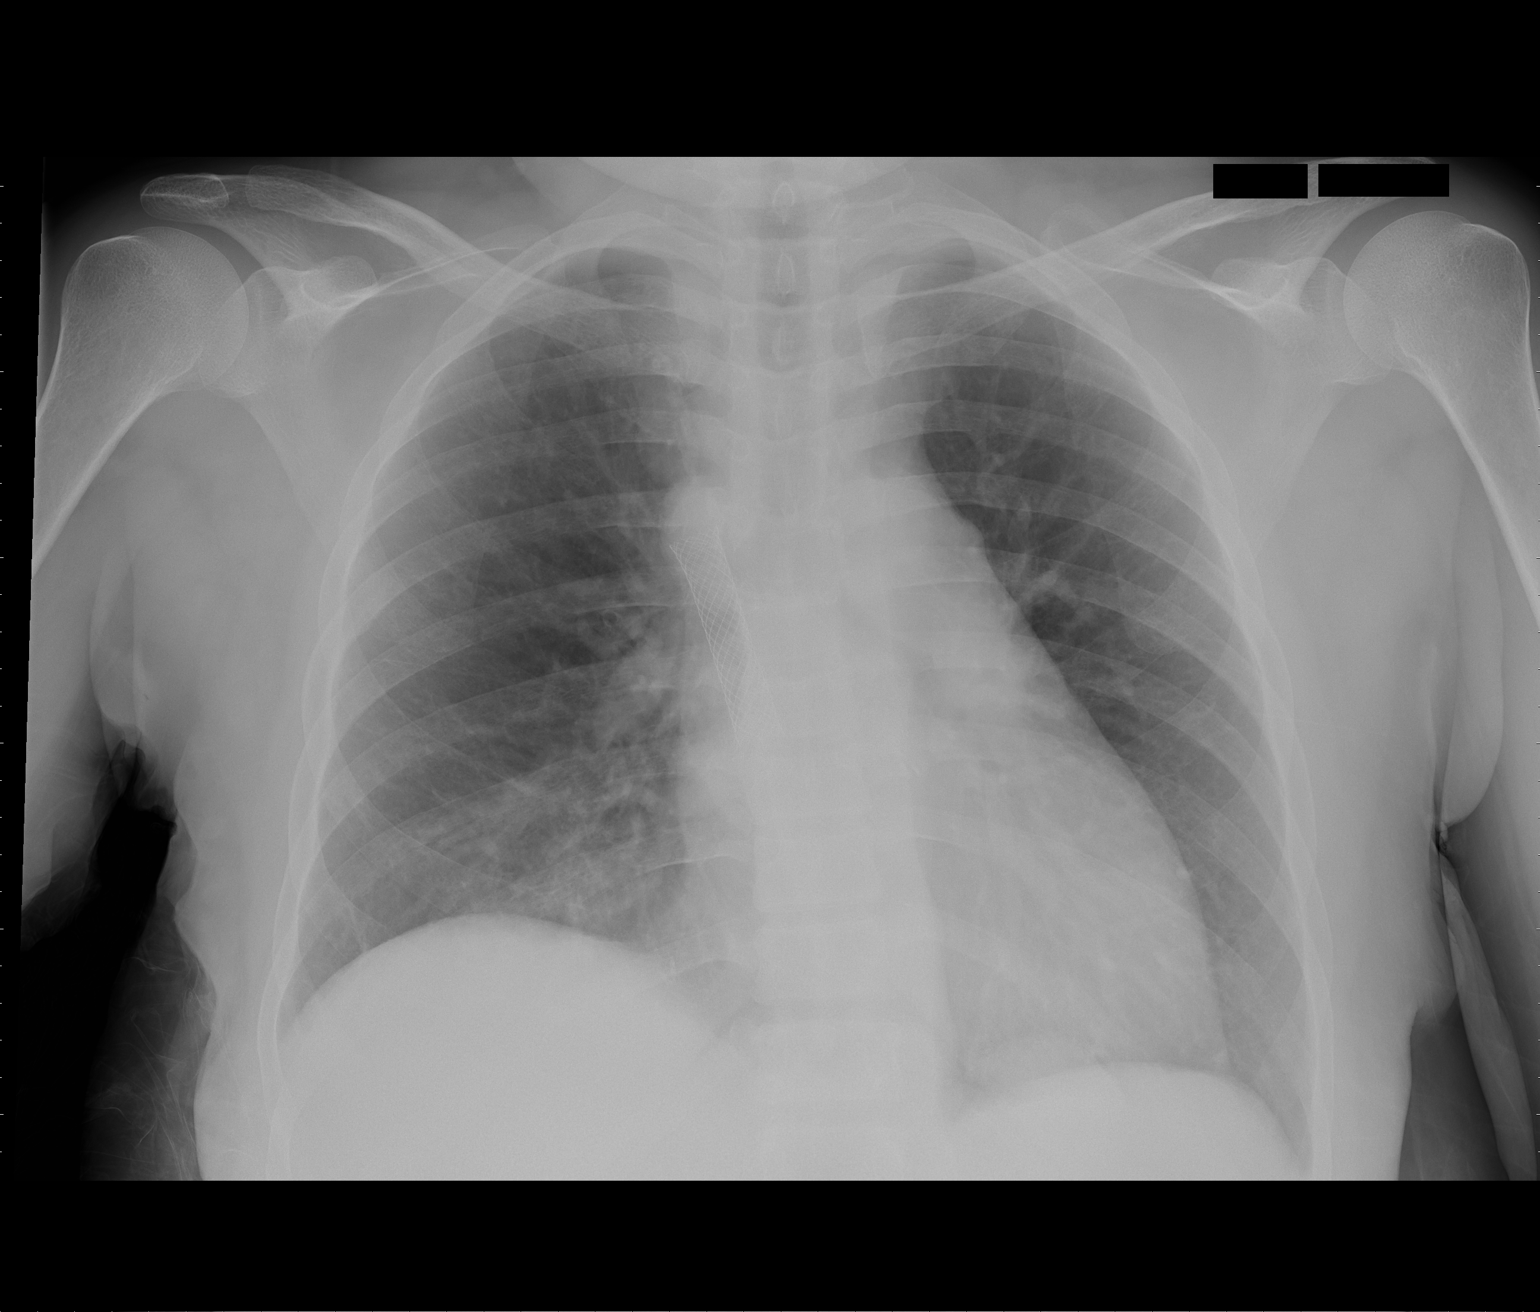

[1 of 1 positions shown; findings below may reference images not displayed]

FINDINGS: An SVC stent is in place.  Heart size is normal.  The
lungs are clear.  The there is no pneumothorax.
IMPRESSION: 1.  No acute cardiopulmonary disease or significant interval
change.
2.  No pneumothorax.

## 2013-08-09 IMAGING — CR DG CHEST 1V PORT
1 series · 1 of 1 positions shown · non-contrast
Comparison: 05/22/2011; 02/19/2011; chest CT - 10/24/2004

CLINICAL DATA: Evaluate for pneumonia

PORTABLE CHEST - 1 VIEW

[view not recorded]
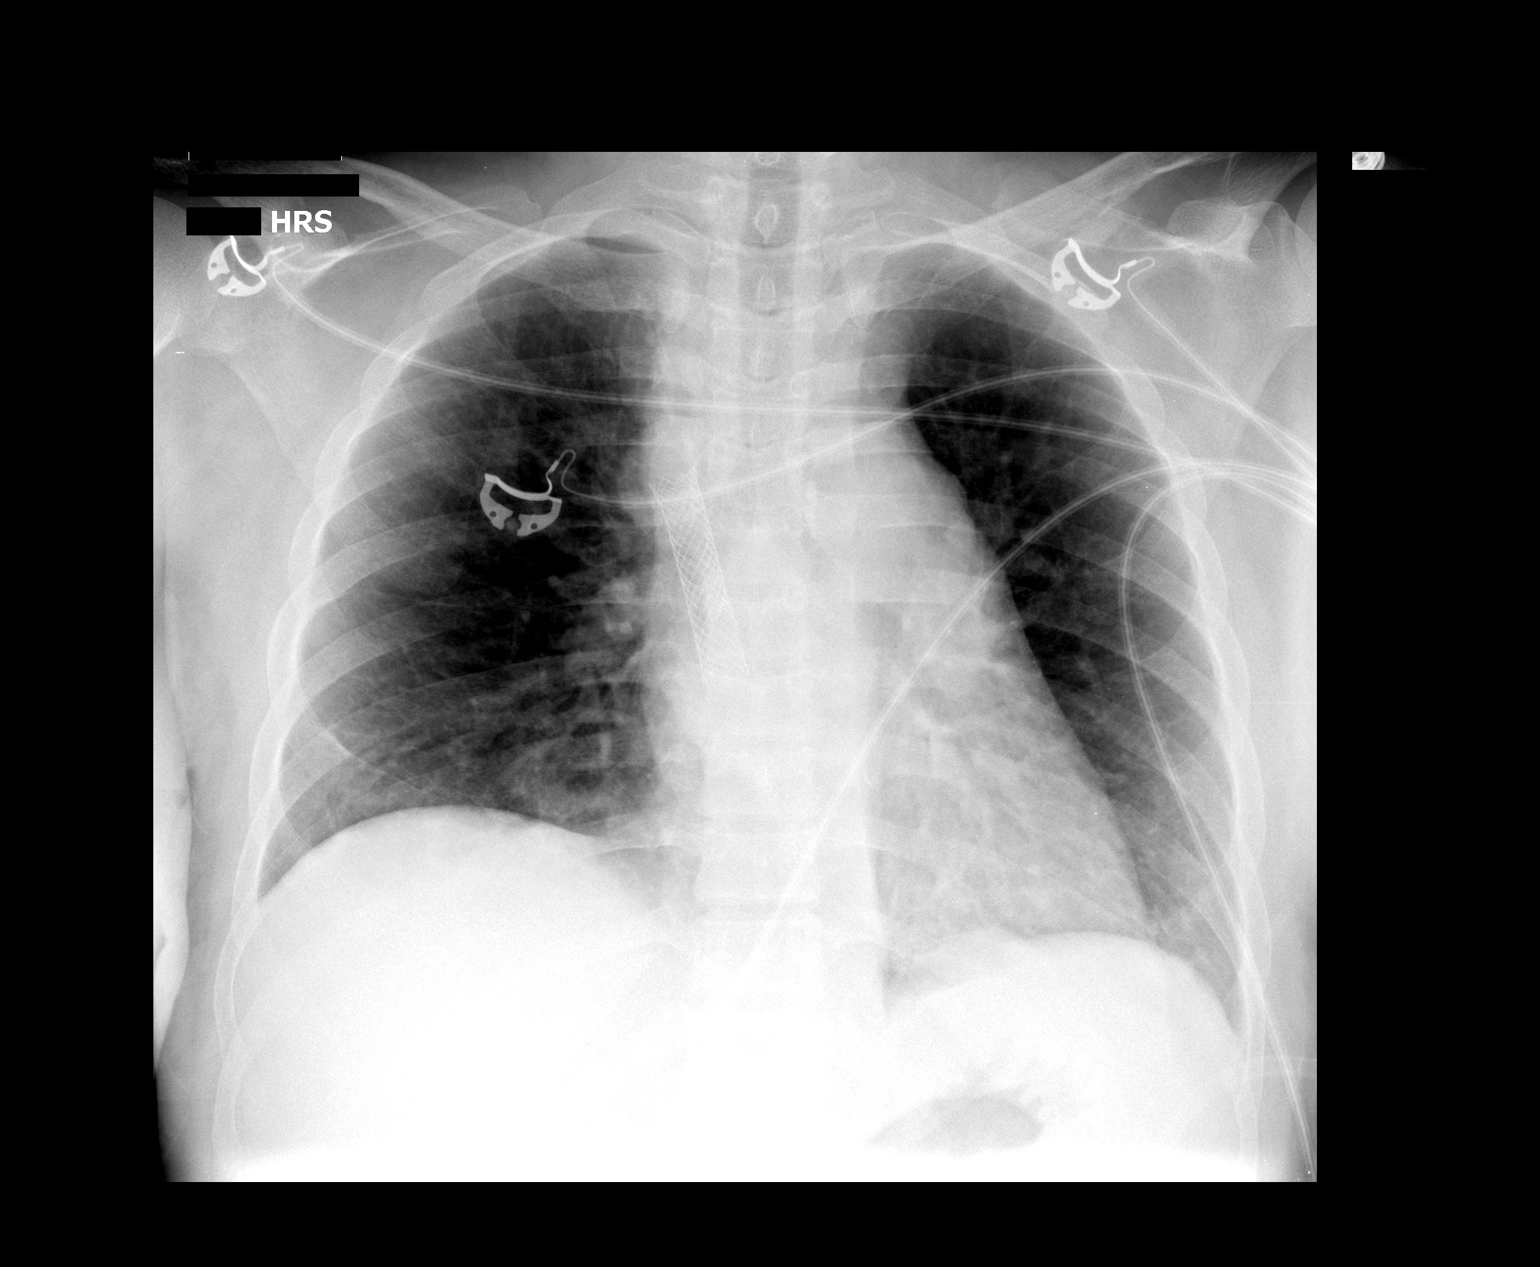

[1 of 1 positions shown; findings below may reference images not displayed]

FINDINGS: Grossly unchanged cardiac silhouette and mediastinal
contours with a vascular stent overlying the expected location of
the distal SVC.  Grossly unchanged bibasilar heterogeneous
opacities, left greater than right.  There is persistent mild
elevation of the right hemidiaphragm.  No definite pleural effusion
or pneumothorax.  Grossly unchanged bones.
IMPRESSION: Grossly unchanged bibasilar heterogeneous opacities favored to
represent atelectasis.

## 2013-08-10 IMAGING — US US RENAL TRANSPLANT
1 series · 13 of 25 positions shown · non-contrast
Comparison: Renal ultrasound - 08/24/2008

CLINICAL DATA: Acute renal failure and transplant patient with
tenderness over the right lower quadrant allograft.  Evaluate for
possible rejection

ULTRASOUND OF RENAL TRANSPLANT
TECHNIQUE: Ultrasound examination of the renal transplant was
performed, with color and duplex Doppler evaluation.

[Series 1: us renal transplant · 0.20mm/px · 13 of 50 slices shown]
[im 1/50]
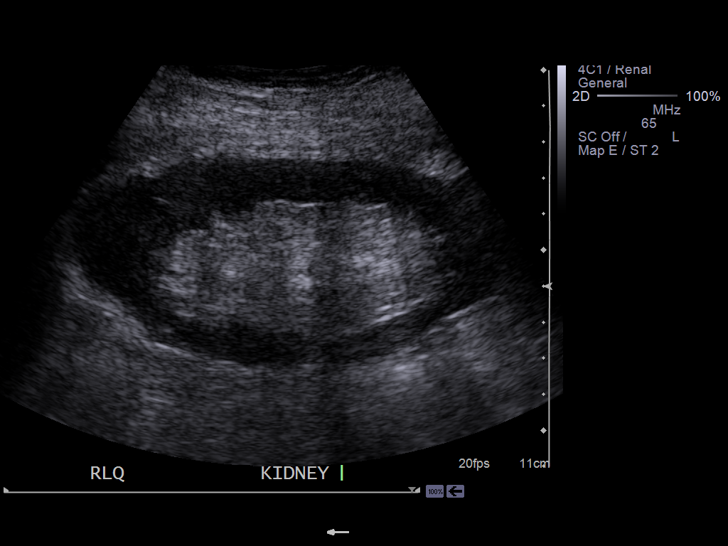
[im 5/50]
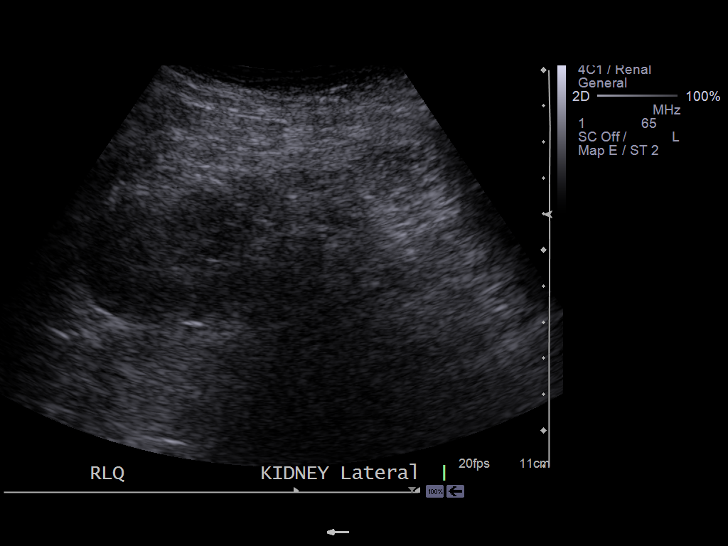
[im 9/50]
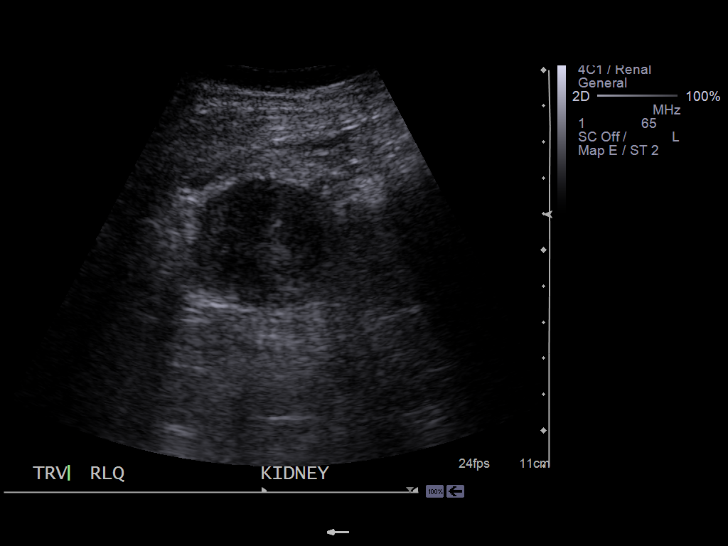
[im 13/50]
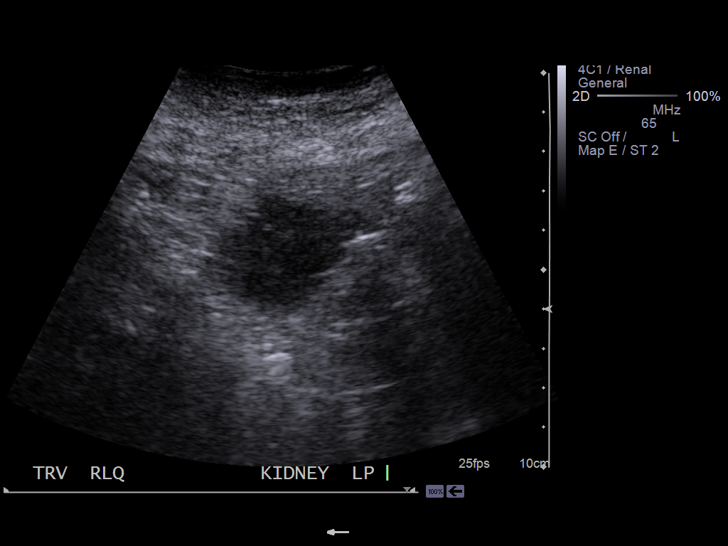
[im 17/50]
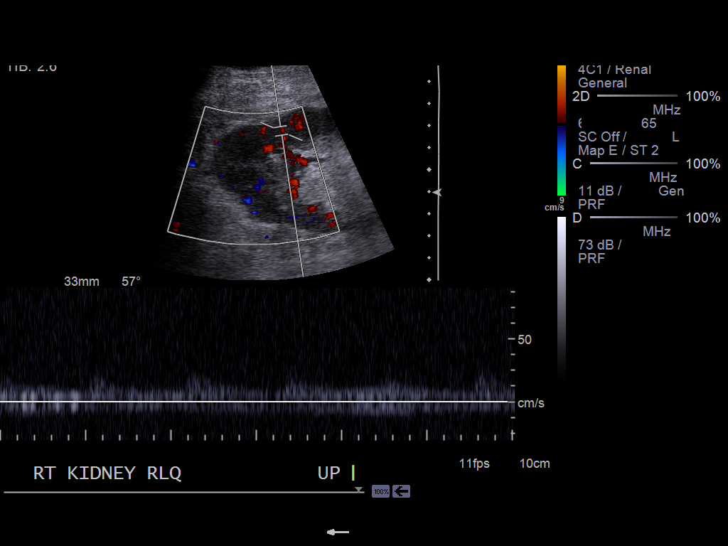
[im 21/50]
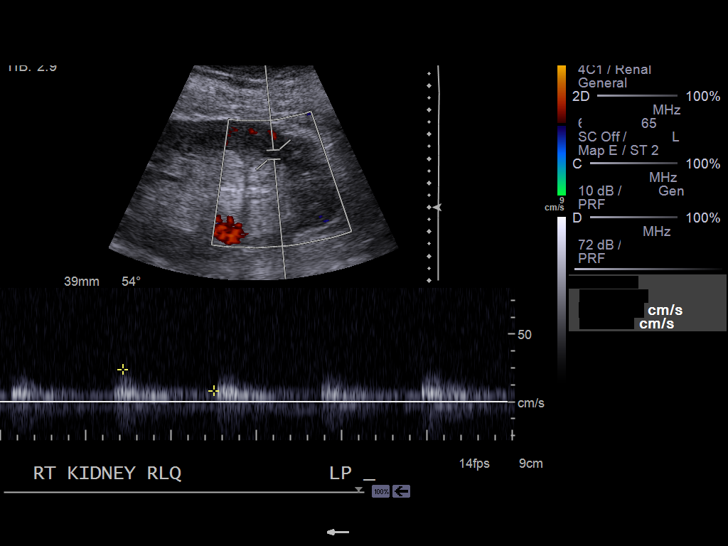
[im 25/50]
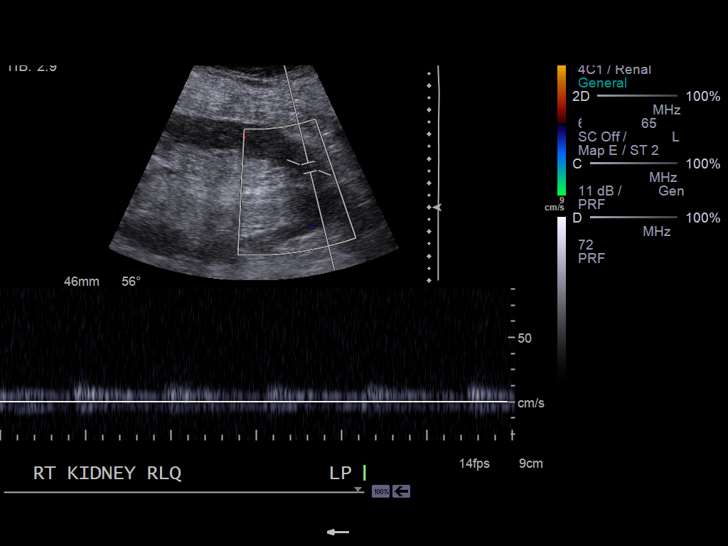
[im 29/50]
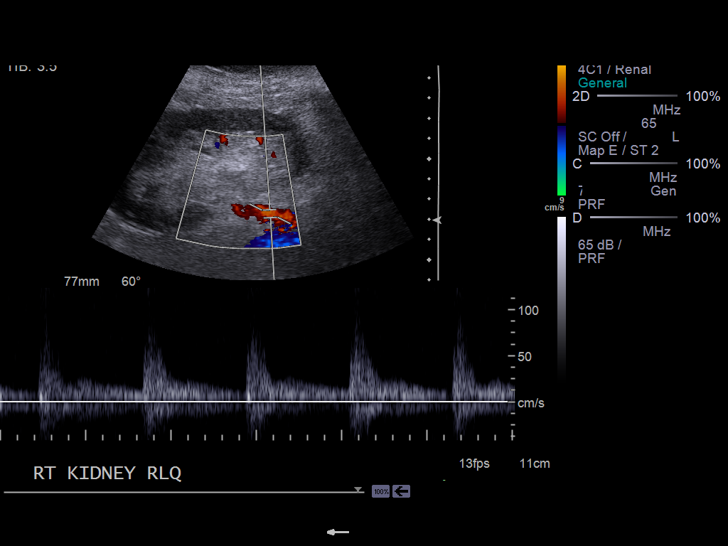
[im 33/50]
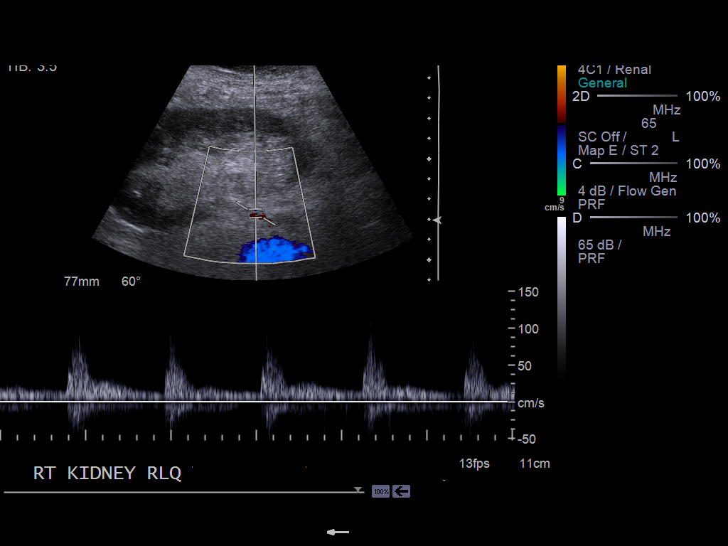
[im 37/50]
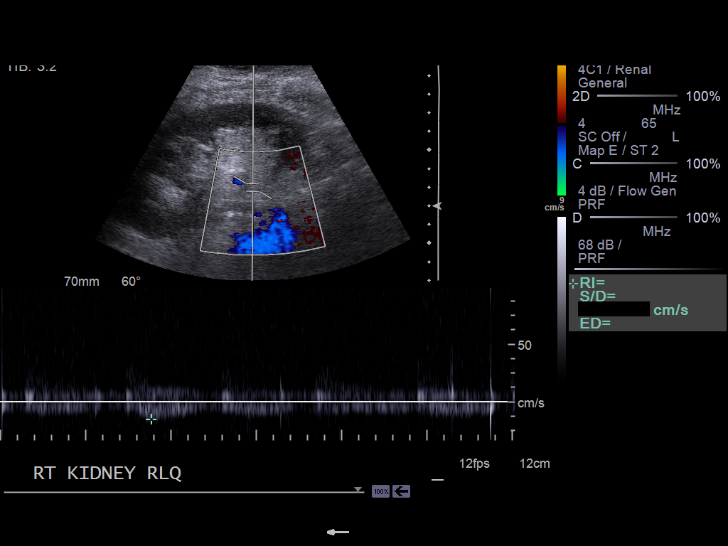
[im 41/50]
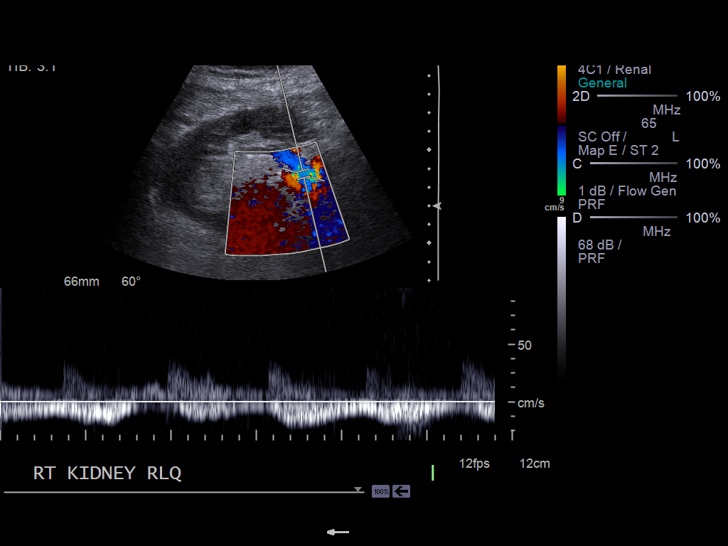
[im 45/50]
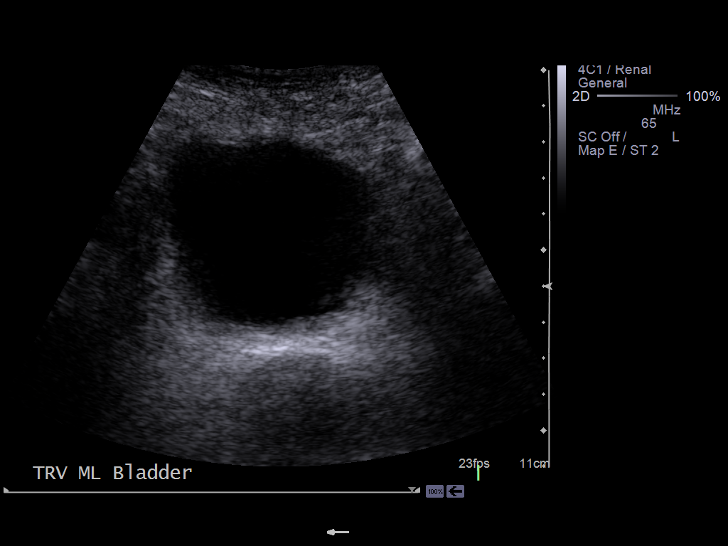
[im 50/50]
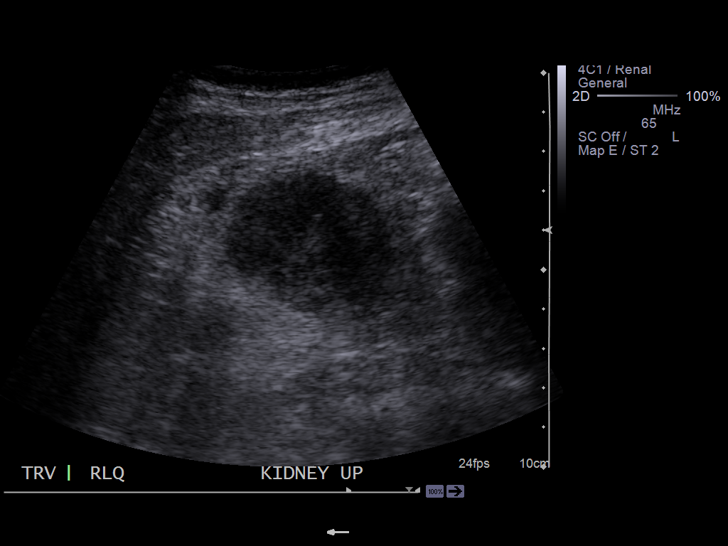

[13 of 25 positions shown; findings below may reference images not displayed]

FINDINGS: Transplant kidney location:  Right lower quadrant

Transplant kidney description:  The right lower quadrant transplant
kidney is unchanged in size measuring approximately 12.4 cm in
length.  Unchanged cortical echogenicity and thickness.  No
hydronephrosis.  There is a very minimal amount of fluid adjacent
to the superior pole of the right kidney (image 51).

Color flow in the main renal artery at the hilum:  Present

Color flow in the main renal vein at the hilum:  Present

Resistive indices:
      Main renal artery:
      Upper pole segmental renal artery:
      Lower pole segmental renal artery:

Bladder:  Normal for degree of bladder distention.
IMPRESSION: 1.  Grossly unchanged appearance of right lower quadrant transplant
kidney.  No evidence of hydronephrosis.
2.  There is a very minimal amount of fluid adjacent to the
superior pole right kidney.  No drainable fluid collections.
3.  No definite evidence of rejection with borderline elevated
resistive indices within the main renal artery but normal resistive
indices within the upper and lower poles of the transplant kidney.

## 2013-08-24 ENCOUNTER — Ambulatory Visit: Payer: Medicaid Other

## 2013-08-28 ENCOUNTER — Ambulatory Visit: Payer: Medicaid Other

## 2013-08-29 ENCOUNTER — Ambulatory Visit
Admission: RE | Admit: 2013-08-29 | Discharge: 2013-08-29 | Disposition: A | Discharge status: Home / Self Care | Payer: Medicaid Other | Source: Ambulatory Visit

## 2013-08-29 DIAGNOSIS — Z1231 Encounter for screening mammogram for malignant neoplasm of breast: Secondary | ICD-10-CM

## 2013-08-29 IMAGING — CT CT ABD-PELV W/O CM
2 of 4 series · 17 of 46 positions shown, 19 images · non-contrast
Comparison: Transplant ultrasound 05/24/2011. CT chest 10/24/2004.

CLINICAL DATA: Right lower quadrant pain.  History of renal
transplant.

CT ABDOMEN AND PELVIS WITHOUT CONTRAST
TECHNIQUE: Multidetector CT imaging of the abdomen and pelvis was
performed following the standard protocol without intravenous
contrast.

[Series 2: abd/pelv w/o 5.0 b31f st · axial · non-contrast · 0.70mm/px · z∈[-778,-404]mm · 14 of 83 slices shown, 16 images]
[im 4/83  soft-tissue]
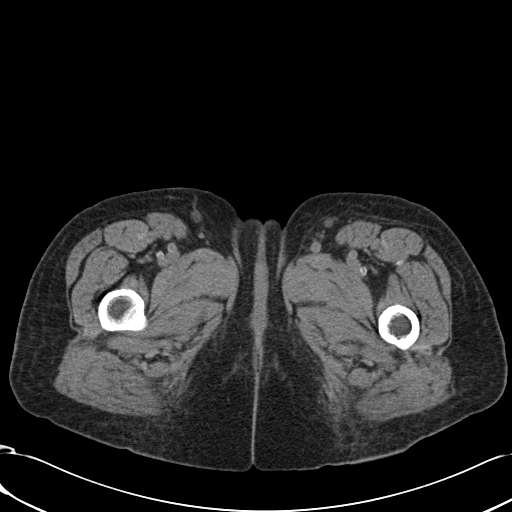
[im 4/83  bone]
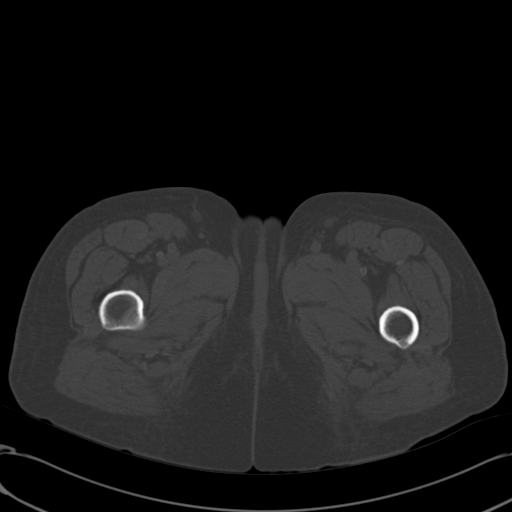
[im 10/83  soft-tissue]
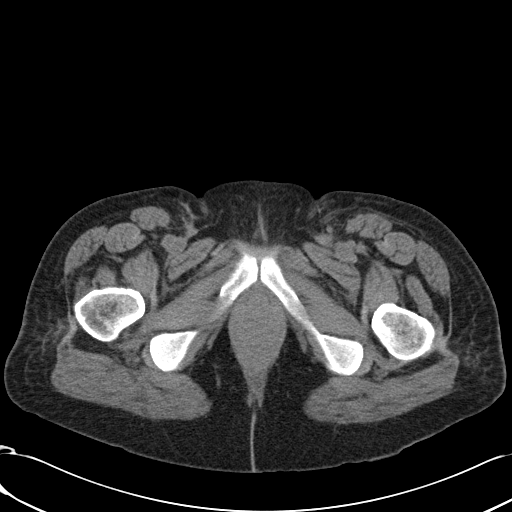
[im 17/83  soft-tissue]
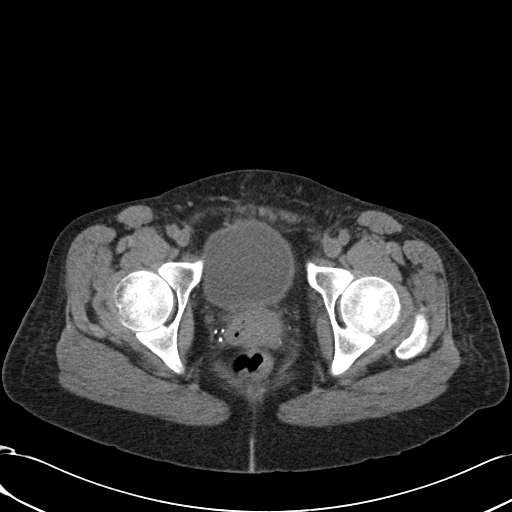
[im 23/83  soft-tissue]
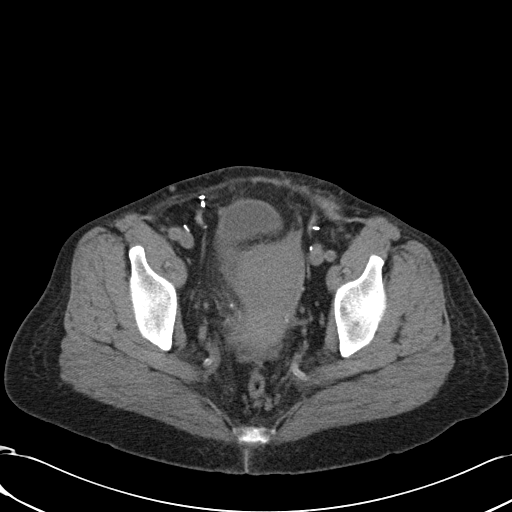
[im 27/83  soft-tissue]
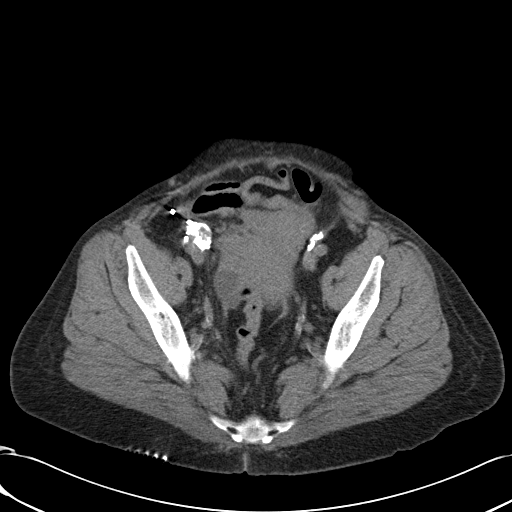
[im 33/83  soft-tissue]
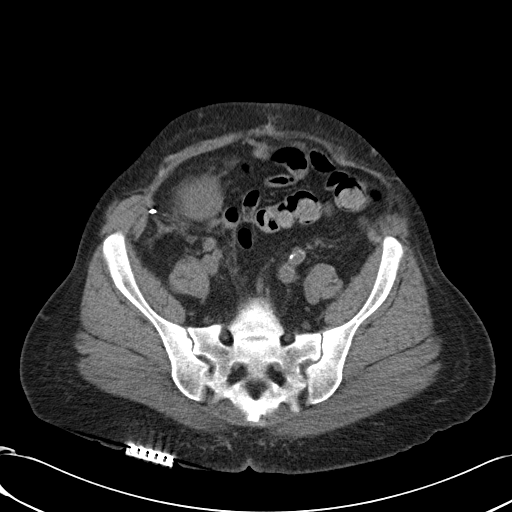
[im 40/83  soft-tissue]
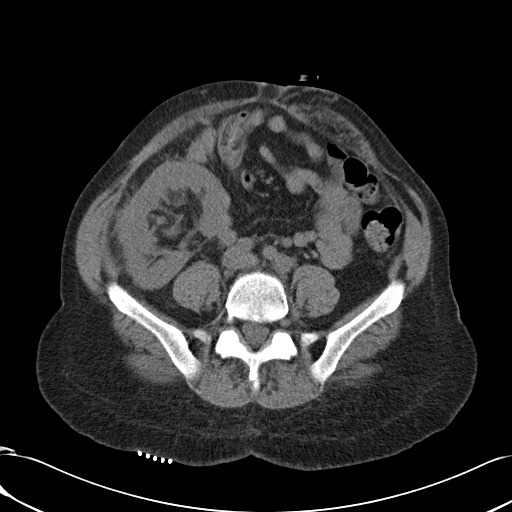
[im 43/83  soft-tissue]
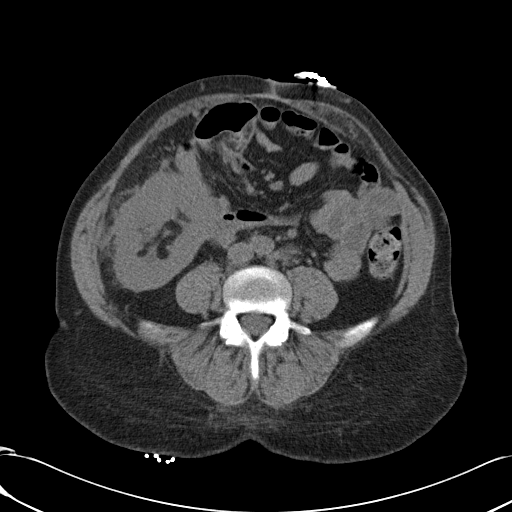
[im 50/83  soft-tissue]
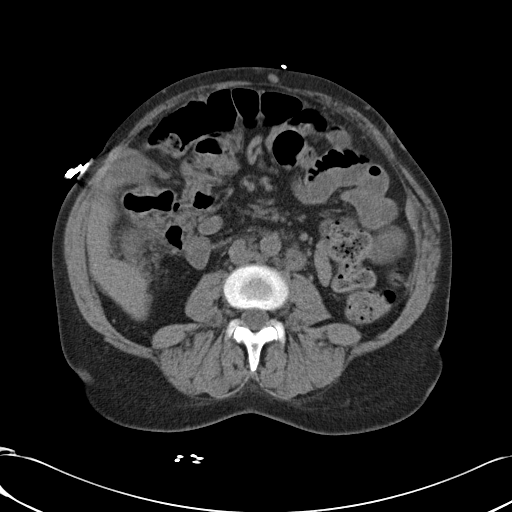
[im 50/83  bone]
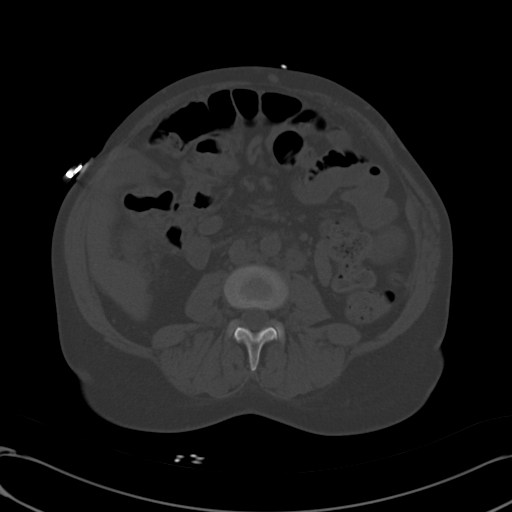
[im 56/83  soft-tissue]
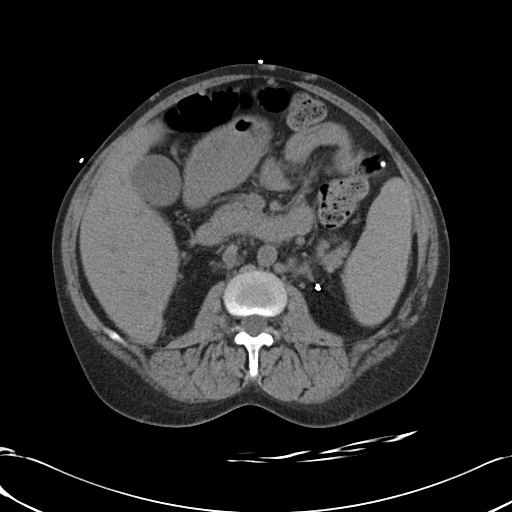
[im 63/83  soft-tissue]
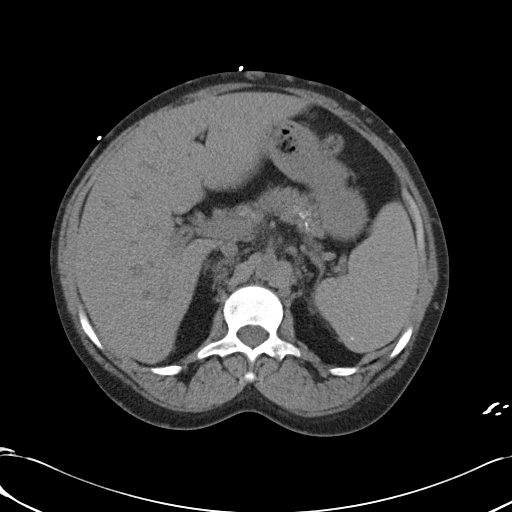
[im 66/83  soft-tissue]
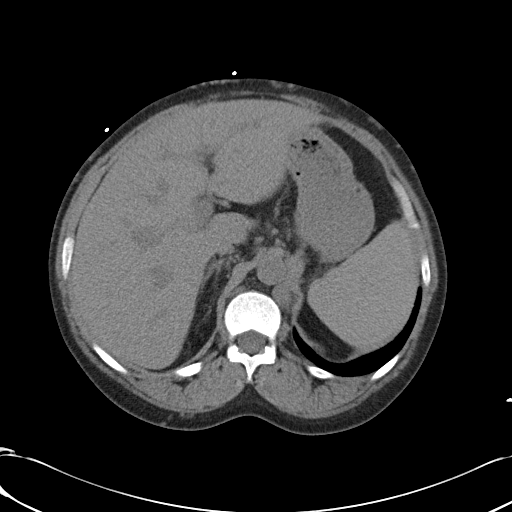
[im 73/83  soft-tissue]
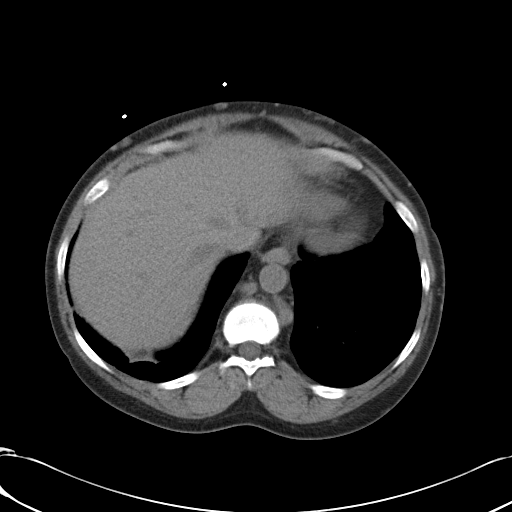
[im 79/83  soft-tissue]
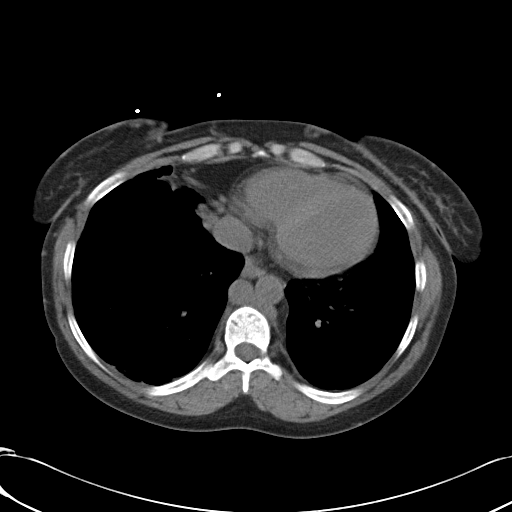

[Series 602: coronals · coronal · 0.81mm/px · 3 of 124 slices shown]
[im 42/124  soft-tissue]
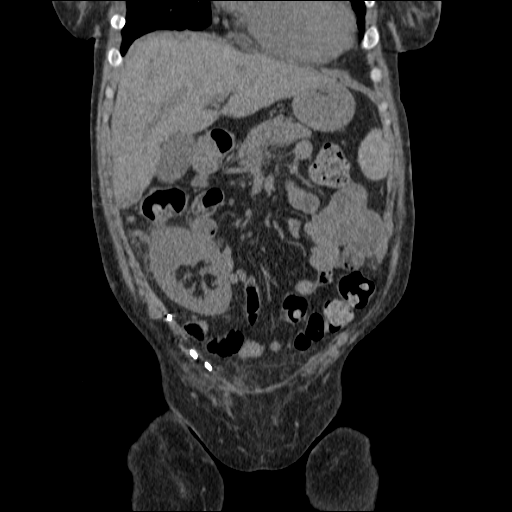
[im 55/124  soft-tissue]
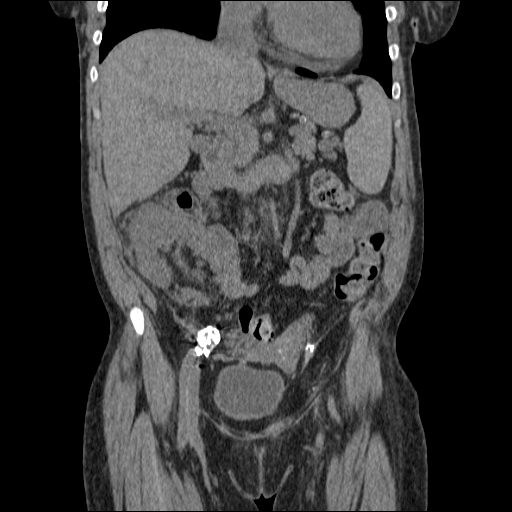
[im 69/124  soft-tissue]
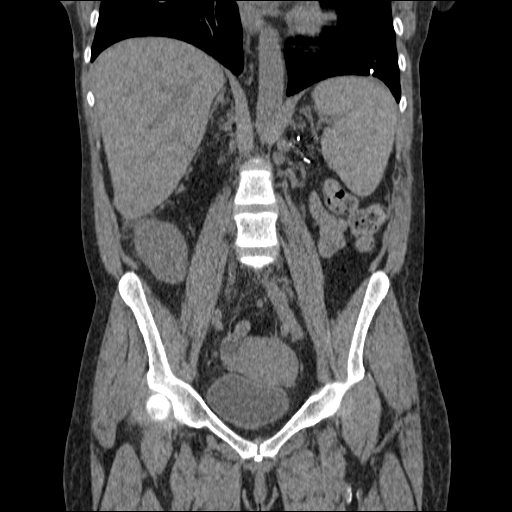

[17 of 46 positions shown; findings below may reference images not displayed]

FINDINGS: There is some atelectatic change in the right lung base.
Calcified granulomata are seen in the left lung base.  Scar is
noted in the right middle lobe.  No pleural or pericardial
effusion. Tortuous retrocrural vasculature is noted and unchanged.

The patient has a right lower quadrant renal transplant.  There is
some stranding about the transplanted kidney but no fluid
collection, stone, mass or hydronephrosis is identified.  There is
a small ovoid calcification in the right lower quadrant which could
be secondary to postoperative change, calcified lymph node or
possibly a markedly atrophied renal transplant.  The native kidneys
appear to have been removed.

There is no lymphadenopathy.  The patient has extensive collateral
vasculature in the retroperitoneum.  The gallbladder, liver,
biliary tree, pancreas and adrenal glands appear normal.  A few
small splenic calcifications are seen. The bones demonstrate
diffuse osteosclerosis consistent with renal osteodystrophy.
IMPRESSION: 1.  No acute finding.  Right lower quadrant renal transplant is
unremarkable in appearance without hydronephrosis or surrounding
fluid collection.
2.  Extensive collateral vasculature in the chest and abdomen.
3.  Old granulomatous disease.
4.  Renal osteodystrophy.

## 2013-09-02 IMAGING — US US RENAL
1 series · 14 of 19 positions shown · non-contrast
Comparison: 06/12/2011.

CLINICAL DATA: Elevated creatinine.  Rising creatinine.

RENAL/URINARY TRACT ULTRASOUND COMPLETE

[Series 1: us renal · 0.21mm/px · 14 of 19 slices shown]
[im 1/19]
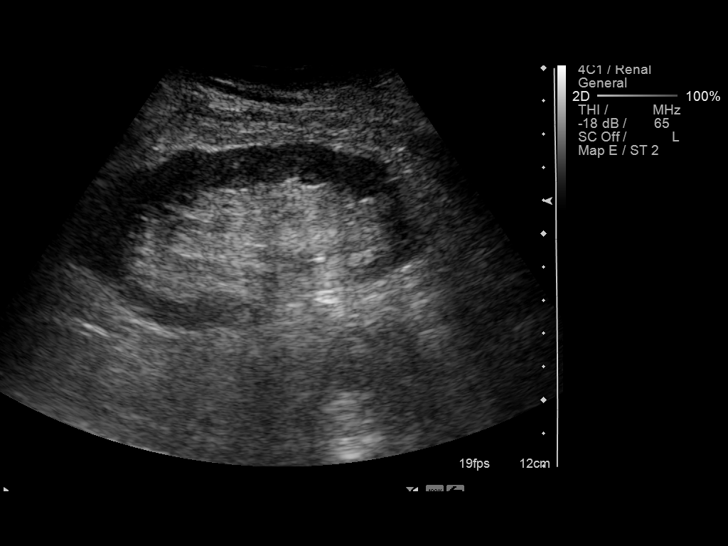
[im 3/19]
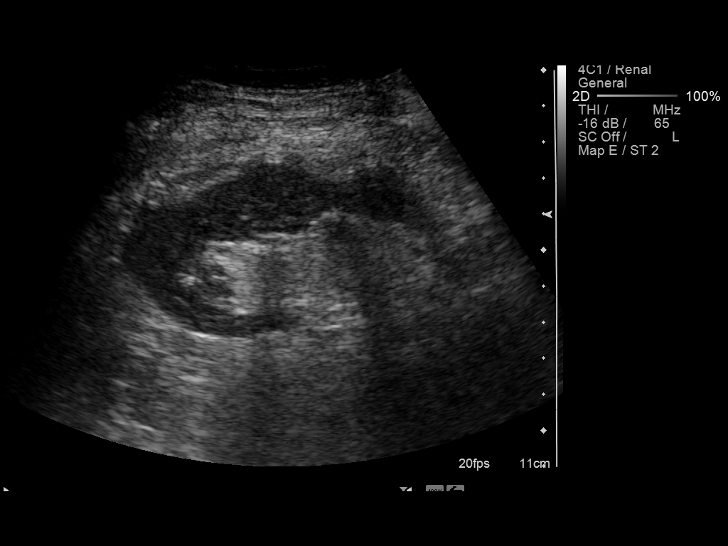
[im 4/19]
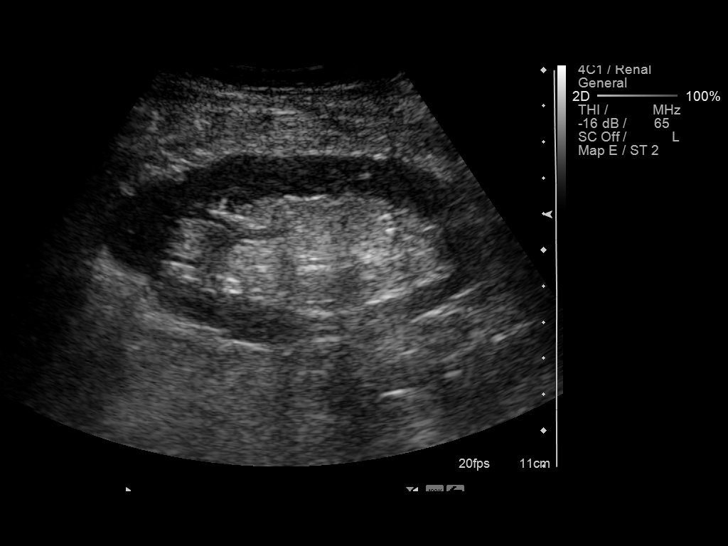
[im 5/19]
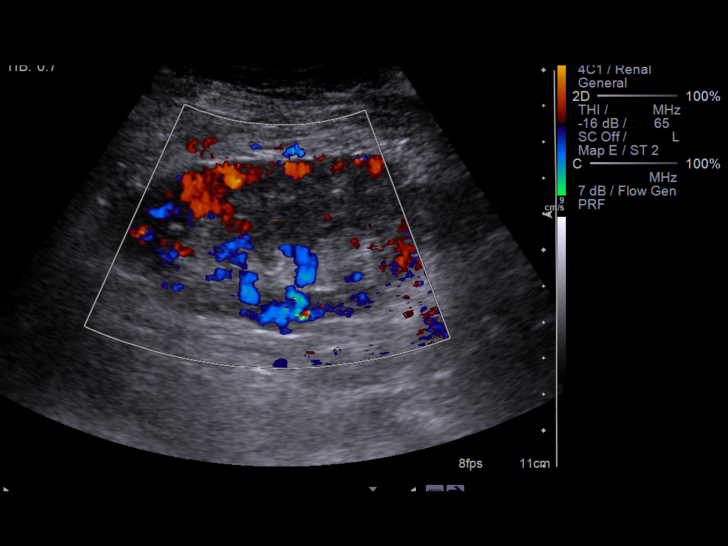
[im 7/19]
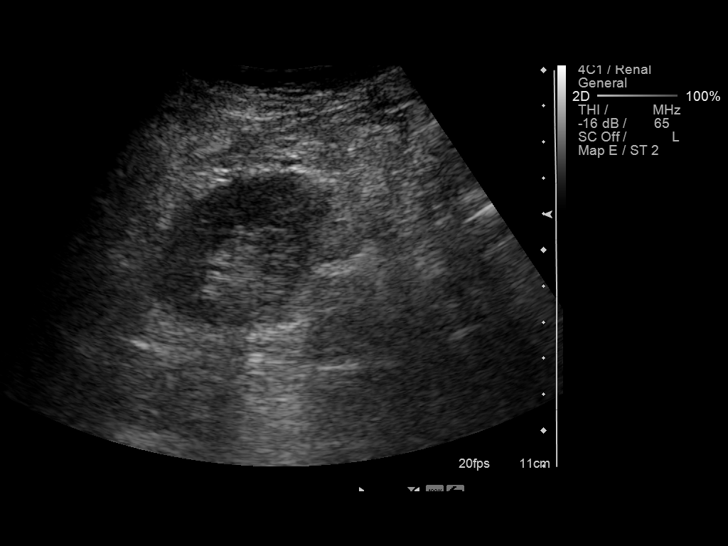
[im 8/19]
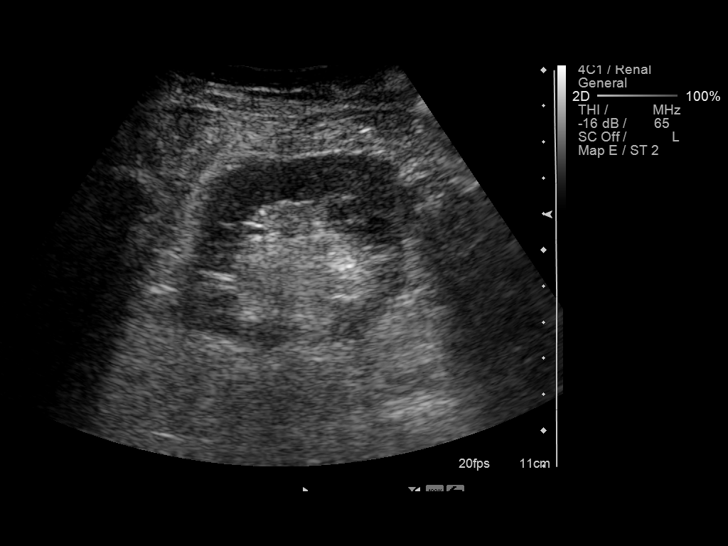
[im 9/19]
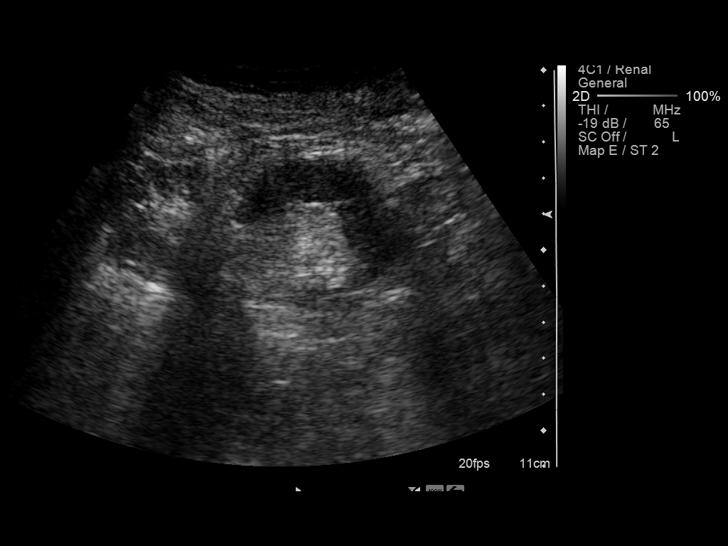
[im 11/19]
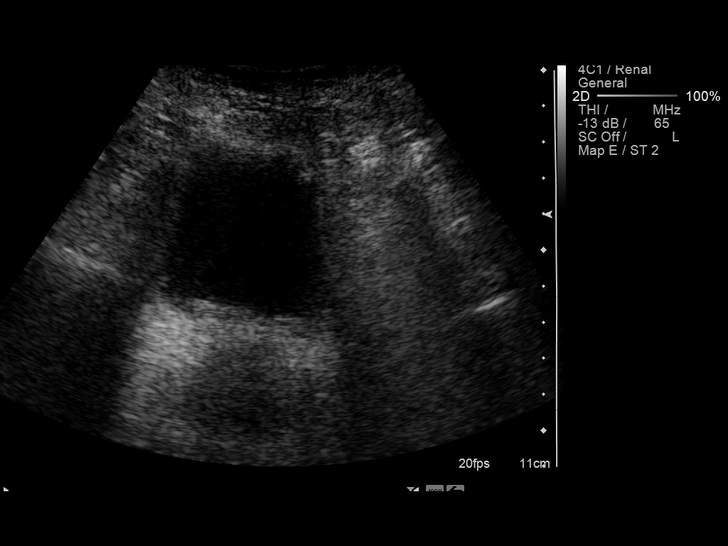
[im 12/19]
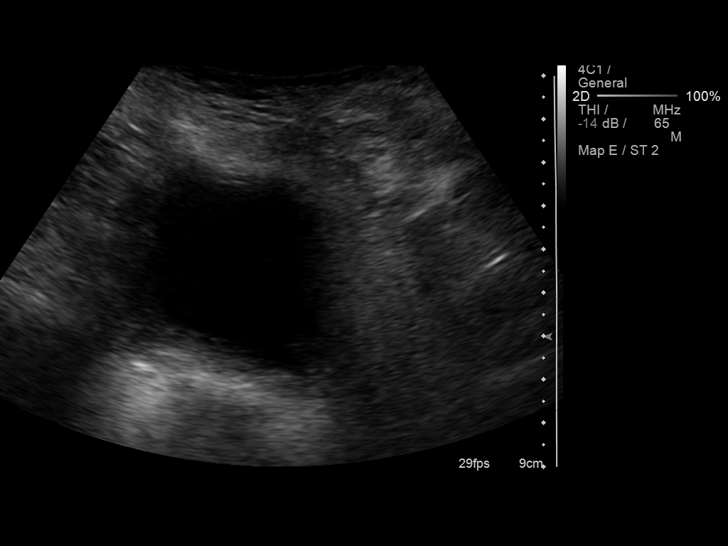
[im 13/19]
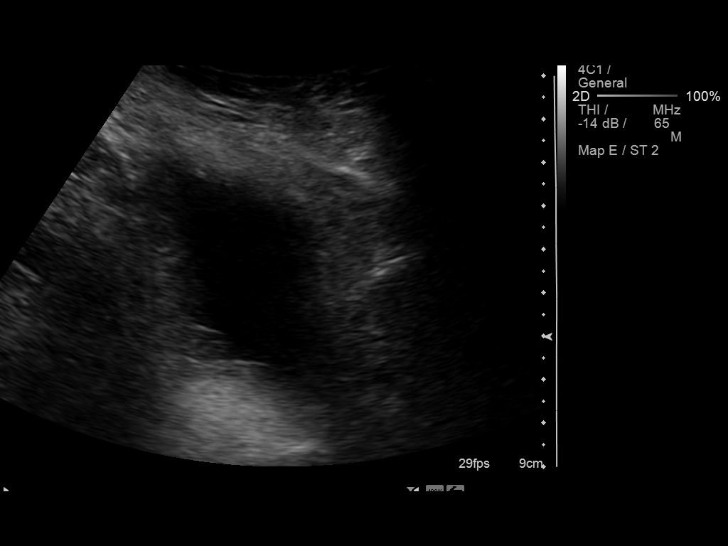
[im 15/19]
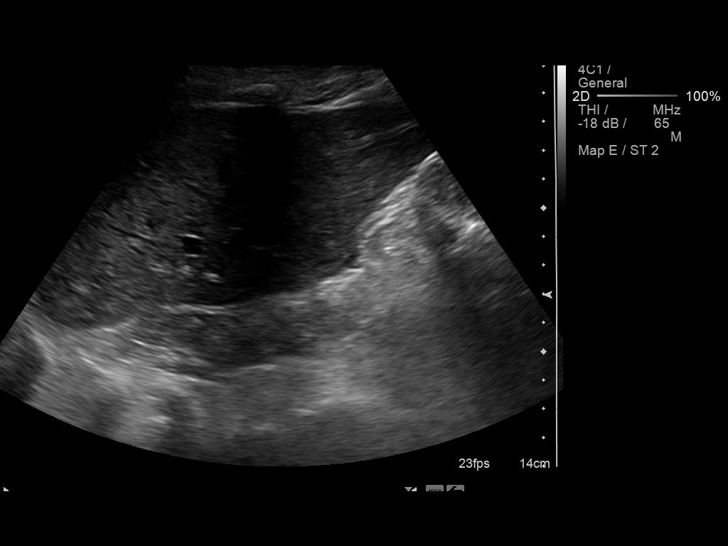
[im 16/19]
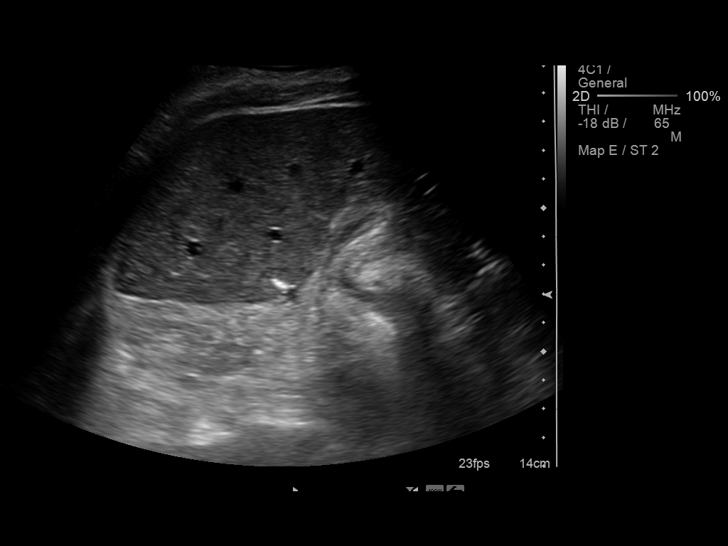
[im 17/19]
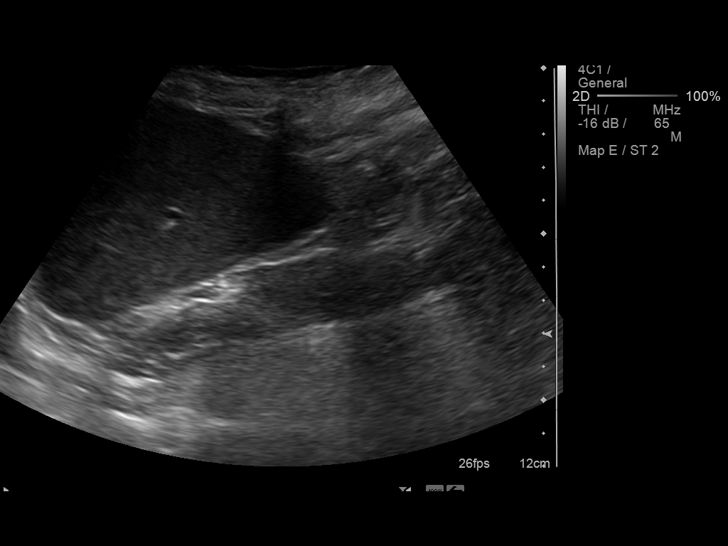
[im 19/19]
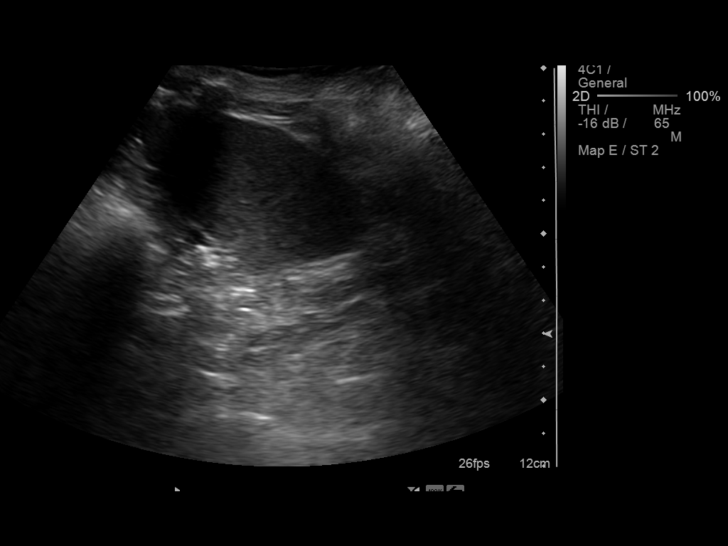

[14 of 19 positions shown; findings below may reference images not displayed]

FINDINGS: Right iliac fossa renal allograft is present. Long axis measurement
is 10.9 cm.  Previously this measured 12.4 cm. This is probably due
to technical differences as the kidney has ranged in size on prior
examinations.  Central sinus echo complex appears normal.  No
hydronephrosis. Doppler wave forms were not requested or performed.

Bladder:  Normal
IMPRESSION: Uncomplicated appearance of right iliac fossa renal allograft.  No
hydronephrosis.

## 2013-09-08 ENCOUNTER — Encounter (HOSPITAL_COMMUNITY)
Admission: RE | Admit: 2013-09-08 | Discharge: 2013-09-08 | Disposition: A | Discharge status: Home / Self Care | Payer: Medicaid Other | Source: Ambulatory Visit | Attending: Nephrology | Admitting: Nephrology

## 2013-09-08 DIAGNOSIS — D638 Anemia in other chronic diseases classified elsewhere: Secondary | ICD-10-CM | POA: Diagnosis not present

## 2013-09-08 DIAGNOSIS — N183 Chronic kidney disease, stage 3 unspecified: Secondary | ICD-10-CM | POA: Diagnosis not present

## 2013-09-08 LAB — IRON AND TIBC
Iron: 91 ug/dL (ref 42–135)
Saturation Ratios: 49 % (ref 20–55)
TIBC: 187 ug/dL — ABNORMAL LOW (ref 250–470)
UIBC: 96 ug/dL — ABNORMAL LOW (ref 125–400)

## 2013-09-08 LAB — POCT HEMOGLOBIN-HEMACUE: Hemoglobin: 9.2 g/dL — ABNORMAL LOW (ref 12.0–15.0)

## 2013-09-08 LAB — RENAL FUNCTION PANEL
Albumin: 4 g/dL (ref 3.5–5.2)
Anion gap: 13 (ref 5–15)
BUN: 46 mg/dL — ABNORMAL HIGH (ref 6–23)
CO2: 24 mEq/L (ref 19–32)
Calcium: 9.7 mg/dL (ref 8.4–10.5)
Chloride: 108 mEq/L (ref 96–112)
Creatinine, Ser: 2.42 mg/dL — ABNORMAL HIGH (ref 0.50–1.10)
GFR calc Af Amer: 26 mL/min — ABNORMAL LOW (ref >90)
GFR calc non Af Amer: 23 mL/min — ABNORMAL LOW (ref >90)
Glucose, Bld: 76 mg/dL (ref 70–99)
Phosphorus: 3.1 mg/dL (ref 2.3–4.6)
Potassium: 4.6 mEq/L (ref 3.7–5.3)
Sodium: 145 mEq/L (ref 137–147)

## 2013-09-08 LAB — FERRITIN: Ferritin: 1564 ng/mL — ABNORMAL HIGH (ref 10–291)

## 2013-09-08 MED ORDER — EPOETIN ALFA 40000 UNIT/ML IJ SOLN
INTRAMUSCULAR | Status: AC
  Administered 2013-09-08: 40000 [IU] via SUBCUTANEOUS
  Filled 2013-09-08: qty 1

## 2013-09-08 MED ORDER — EPOETIN ALFA 20000 UNIT/ML IJ SOLN: 40000.0000 [IU] | INTRAMUSCULAR | Status: DC

## 2013-09-12 ENCOUNTER — Other Ambulatory Visit: Payer: Self-pay | Admitting: Gastroenterology

## 2013-09-13 ENCOUNTER — Encounter: Payer: Self-pay | Admitting: Gastroenterology

## 2013-09-13 ENCOUNTER — Ambulatory Visit (INDEPENDENT_AMBULATORY_CARE_PROVIDER_SITE_OTHER): Payer: Medicaid Other | Admitting: Gastroenterology

## 2013-09-13 VITALS — BP 104/60 | HR 60 | Ht 58.25 in | Wt 109.1 lb

## 2013-09-13 DIAGNOSIS — K3184 Gastroparesis: Secondary | ICD-10-CM

## 2013-09-13 MED ORDER — METOCLOPRAMIDE HCL 5 MG PO TABS: ORAL_TABLET | ORAL | Status: DC

## 2013-09-13 MED ORDER — NYSTATIN 100000 UNIT/ML MT SUSP: 5.0000 mL | Freq: Four times a day (QID) | OROMUCOSAL | Status: DC

## 2013-09-13 NOTE — Progress Notes (Signed)
Review of pertinent gastrointestinal problems:  1. Chronic nausea, vomiting. EGD August 2009 was normal except for small hiatal hernia. Status post kidney transplant on immunosuppressive medicines which could cause nausea, narcotics periodically. Normal complete metabolic profile, slight anemia, labs July 2009. Changing her Myfortic to Imuran helped nausea temporarily, symptoms returned. GES 03/2008 slightly slow emptying. [name] 2/201normal GB, normal bilary tree. Hospitalized 03/2008 [facility] MD put her on reglan 5mg  three times a day, GI was not consulted. March, 2010 combination of daily scheduled Zofran and t.i.d. Reglan 5 mg a day has been helpful. She was cut back to Reglan as needed to due concern for potential side effects. June 2011: Was recommended to take 1-2 Reglan pills at bedtime only. Also started Megace for appetite stimulant. January 2013: She has gained 20 pounds and her nausea is under much better control with nightly Reglan.  04/2012 EGD Ardis Hughs; normal except for possible candida in esophagus, treated.   HPI: This is a  very pleasant 48 year old woman whom I last saw about a year ago. She was here in our office a few months ago, Amy for exacerbation of her nausea vomiting. This is multifactorial including narcotic use, gastroparesis, side effects of immunosuppressive medicines.   Currently on reglan 5mg  twice daily, plus PRN.  Will need zofran periodically, about once per week.   The regimen she is on now seems to be holding her very well. She is really here for refills only.  She asked about narcotic pain medicine refill and I declined to give that to her today. These are adding to her GI miseries      Past Medical History  Diagnosis Date  . Gastroparesis   . Gout   . Hypotension   . Herpes genitalia 1994  . E coli bacteremia 06/18/2011  . Bacteremia due to Gram-negative bacteria 05/23/2011  . History of blood transfusion     "more than a few times" (12/11/2011)  . History of  stomach ulcers   . Stroke      left basal ganglia lacunar infarct; Right frontal lobe lacunar infarct.  . Stroke ~ 1999; 2001    "briefly lost my vision; lost my right eye" (12/11/2011)  . Chronic lower back pain   . Depression   . Osteopenia   . Headache(784.0)     "not often anymore" (12/11/2011)  . Complication of anesthesia     "woke up during OR; I have an extremely high tolerance" (12/11/2011)  . Dysrhythmia     "tachycardia" (12/11/2011)  . ESRD (end stage renal disease) 06/12/2011  . Iron deficiency anemia   . Seizures 1994    "post transplant; only have had that one" (12/11/2011)  . GERD (gastroesophageal reflux disease)     Past Surgical History  Procedure Laterality Date  . Kidney transplant  1994; 1999; 2005    "right"  . Appendectomy  ~ 2004  . 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 Total nephrectomy  1988?; 1994; 2005  . Multiple tooth extractions    . Enucleation  2001    "right"  . Esophagogastroduodenoscopy (egd) with propofol N/A 04/21/2012    Procedure: ESOPHAGOGASTRODUODENOSCOPY (EGD) WITH PROPOFOL;  Surgeon: Milus Banister, MD;  Location: [facility];  Service: Endoscopy;  Laterality: N/A;    Current Outpatient Prescriptions  Medication Sig Dispense Refill  . aspirin EC 81 MG tablet Take 81 mg by mouth daily.      [name]  azaTHIOprine (IMURAN) 50 MG tablet Take 100 mg by mouth daily.       . colchicine 0.6 MG tablet Take 0.6 mg by mouth daily as needed (For gout.).      . Darbepoetin Alfa-Albumin (ARANESP IJ) Inject as directed as directed.      . dicyclomine (BENTYL) 20 MG tablet Take 20 mg by mouth 2 (two) times daily as needed (for abdominal cramping).      . furosemide (LASIX) 40 MG tablet Take 40 mg by mouth every evening.       . metoCLOPramide (REGLAN) 5 MG tablet TAKE 1 TABLET (5 MG TOTAL) BY MOUTH AT BEDTIME AND MAY REPEAT DOSE ONE TIME IF NEEDED.  60 tablet  11  . Multiple Vitamin  (MULTIVITAMIN) tablet Take 1 tablet by mouth daily.      [name] nystatin (MYCOSTATIN) 100000 UNIT/ML suspension Take 5 mLs (500,000 Units total) by mouth 4 (four) times daily.  240 mL  11  . omeprazole (PRILOSEC) 40 MG capsule Take 40 mg by mouth 2 (two) times daily.       . ondansetron (ZOFRAN-ODT) 4 MG disintegrating tablet Take 1 tablet (4 mg total) by mouth every 6 (six) hours.  40 tablet  0  . oxyCODONE (ROXICODONE) 5 MG immediate release tablet Take 1 tab daily as needed for pain.  30 tablet  0  . oxyCODONE-acetaminophen (PERCOCET) 7.5-325 MG per tablet Take 1 tablet by mouth every 4 (four) hours as needed for pain.      . predniSONE (DELTASONE) 5 MG tablet Take 5 mg by mouth daily.      . promethazine (PHENERGAN) 25 MG suppository Place 1 suppository (25 mg total) rectally every 6 (six) hours as needed for nausea or vomiting.  6 each  1  . promethazine (PHENERGAN) 25 MG tablet Take 1 tablet (25 mg total) by mouth every 6 (six) hours as needed for nausea or vomiting.  12 tablet  0  . tacrolimus (PROGRAF) 1 MG capsule Take 3 mg by mouth 2 (two) times daily.        No current facility-administered medications for this visit.    Allergies as of 09/13/2013 - Review Complete 09/13/2013  Allergen Reaction Noted  . Levofloxacin Itching and Rash 05/23/2011    Family History  Problem Relation Age of Onset  . Hypertension Maternal Grandmother     History   Social History  . Marital Status: Single    Spouse Name: N/A    Number of Children: 0  . Years of Education: N/A   Occupational History  . disabiled    Social History Main Topics  . Smoking status: Current Some Day Smoker -- 0.50 packs/day for 28 years    Types: Cigarettes  . Smokeless tobacco: Never Used     Comment: had quit 03/05/2011  . Alcohol Use: Yes     Comment: uses daily  . Drug Use: Yes    Special: Marijuana  . Sexual Activity: Yes   Other Topics Concern  . Not on file   Social History Narrative  . No narrative on  file      Physical Exam: BP 104/60  Pulse 60  Ht 4' 10.25" (1.48 m)  Wt 109 lb 2 oz (49.499 kg)  BMI 22.60 kg/m2 Constitutional: Chronically ill-appearing Psychiatric: alert and oriented x3 Abdomen: soft, nontender, nondistended, no obvious ascites, no peritoneal signs, normal bowel sounds     Assessment and plan: 48 y.o. female with chronic nausea vomiting, multifactorial  I  have called refills for Reglan, nystatin for her chronic thrush. If she runs out of Zofran or Phenergan she is to get in touch with [name] am happy refill those. She will return to see me in 6 months and sooner if needed.

## 2013-09-13 NOTE — Patient Instructions (Addendum)
Narcotic pain meds can contribute to nausea, gastroparesis. Try to limit them as much as possible. Refills for nystatin, reglan called in.  If you need any other GI refills, call. Please return to see Dr. Ardis Hughs in 6 months, sooner if needed.

## 2013-10-05 ENCOUNTER — Other Ambulatory Visit (HOSPITAL_COMMUNITY): Payer: Self-pay | Admitting: *Deleted

## 2013-10-06 ENCOUNTER — Encounter (HOSPITAL_COMMUNITY)
Admission: RE | Admit: 2013-10-06 | Discharge: 2013-10-06 | Disposition: A | Discharge status: Home / Self Care | Payer: Medicaid Other | Source: Ambulatory Visit | Attending: Nephrology | Admitting: Nephrology

## 2013-10-06 DIAGNOSIS — N183 Chronic kidney disease, stage 3 unspecified: Secondary | ICD-10-CM | POA: Diagnosis not present

## 2013-10-06 DIAGNOSIS — D638 Anemia in other chronic diseases classified elsewhere: Secondary | ICD-10-CM | POA: Diagnosis not present

## 2013-10-06 LAB — POCT HEMOGLOBIN-HEMACUE: Hemoglobin: 8.6 g/dL — ABNORMAL LOW (ref 12.0–15.0)

## 2013-10-06 MED ORDER — EPOETIN ALFA 20000 UNIT/ML IJ SOLN: 40000.0000 [IU] | INTRAMUSCULAR | Status: DC

## 2013-10-06 MED ORDER — EPOETIN ALFA 40000 UNIT/ML IJ SOLN
INTRAMUSCULAR | Status: AC
  Administered 2013-10-06: 40000 [IU] via SUBCUTANEOUS
  Filled 2013-10-06: qty 1

## 2013-10-10 ENCOUNTER — Other Ambulatory Visit: Payer: Self-pay | Admitting: Physician Assistant

## 2013-10-14 ENCOUNTER — Encounter (HOSPITAL_COMMUNITY): Payer: Self-pay | Admitting: Emergency Medicine

## 2013-10-14 ENCOUNTER — Emergency Department (HOSPITAL_COMMUNITY): Payer: Medicaid Other

## 2013-10-14 ENCOUNTER — Emergency Department (HOSPITAL_COMMUNITY)
Admission: EM | Admit: 2013-10-14 | Discharge: 2013-10-14 | Disposition: A | Discharge status: Home / Self Care | Payer: Medicaid Other | Attending: Emergency Medicine | Admitting: Emergency Medicine

## 2013-10-14 DIAGNOSIS — Z8619 Personal history of other infectious and parasitic diseases: Secondary | ICD-10-CM | POA: Diagnosis not present

## 2013-10-14 DIAGNOSIS — Z8669 Personal history of other diseases of the nervous system and sense organs: Secondary | ICD-10-CM | POA: Diagnosis not present

## 2013-10-14 DIAGNOSIS — D509 Iron deficiency anemia, unspecified: Secondary | ICD-10-CM | POA: Diagnosis not present

## 2013-10-14 DIAGNOSIS — Z8659 Personal history of other mental and behavioral disorders: Secondary | ICD-10-CM | POA: Diagnosis not present

## 2013-10-14 DIAGNOSIS — F17200 Nicotine dependence, unspecified, uncomplicated: Secondary | ICD-10-CM | POA: Insufficient documentation

## 2013-10-14 DIAGNOSIS — G8929 Other chronic pain: Secondary | ICD-10-CM | POA: Diagnosis not present

## 2013-10-14 DIAGNOSIS — Z8679 Personal history of other diseases of the circulatory system: Secondary | ICD-10-CM | POA: Insufficient documentation

## 2013-10-14 DIAGNOSIS — M899 Disorder of bone, unspecified: Secondary | ICD-10-CM | POA: Insufficient documentation

## 2013-10-14 DIAGNOSIS — J069 Acute upper respiratory infection, unspecified: Secondary | ICD-10-CM

## 2013-10-14 DIAGNOSIS — M949 Disorder of cartilage, unspecified: Secondary | ICD-10-CM

## 2013-10-14 DIAGNOSIS — R1084 Generalized abdominal pain: Secondary | ICD-10-CM | POA: Insufficient documentation

## 2013-10-14 DIAGNOSIS — N186 End stage renal disease: Secondary | ICD-10-CM | POA: Insufficient documentation

## 2013-10-14 DIAGNOSIS — M109 Gout, unspecified: Secondary | ICD-10-CM | POA: Insufficient documentation

## 2013-10-14 DIAGNOSIS — Z8673 Personal history of transient ischemic attack (TIA), and cerebral infarction without residual deficits: Secondary | ICD-10-CM | POA: Insufficient documentation

## 2013-10-14 DIAGNOSIS — K219 Gastro-esophageal reflux disease without esophagitis: Secondary | ICD-10-CM | POA: Insufficient documentation

## 2013-10-14 DIAGNOSIS — IMO0002 Reserved for concepts with insufficient information to code with codable children: Secondary | ICD-10-CM | POA: Diagnosis not present

## 2013-10-14 DIAGNOSIS — Z79899 Other long term (current) drug therapy: Secondary | ICD-10-CM | POA: Diagnosis not present

## 2013-10-14 DIAGNOSIS — Z7982 Long term (current) use of aspirin: Secondary | ICD-10-CM | POA: Insufficient documentation

## 2013-10-14 DIAGNOSIS — J029 Acute pharyngitis, unspecified: Secondary | ICD-10-CM | POA: Insufficient documentation

## 2013-10-14 DIAGNOSIS — Z940 Kidney transplant status: Secondary | ICD-10-CM | POA: Insufficient documentation

## 2013-10-14 LAB — RAPID STREP SCREEN (MED CTR MEBANE ONLY): Streptococcus, Group A Screen (Direct): NEGATIVE

## 2013-10-14 MED ORDER — OXYCODONE-ACETAMINOPHEN 5-325 MG PO TABS
2.0000 | ORAL_TABLET | Freq: Once | ORAL | Status: AC
  Administered 2013-10-14: 2 via ORAL
  Filled 2013-10-14: qty 2

## 2013-10-14 NOTE — ED Notes (Signed)
Pt reports coughing onset yesterday at 1530. Pt reports feeling worse since. Pt c/o sore throat, chest pain, shortness of breath and fever.

## 2013-10-14 NOTE — ED Provider Notes (Signed)
CSN: [id number]     Arrival date & time 10/14/13  1452 History   First MD Initiated Contact with Patient 10/14/13 1658     Chief Complaint  Patient presents with  . Sore Throat  . Chest Pain  . Shortness of Breath  . Fever     (Consider location/radiation/quality/duration/timing/severity/associated sxs/prior Treatment) HPI Tina Watkins is a 48 year old female with past medical history of transplant who presents the ER with one day of sore throat, cough, shortness of breath, subjective fever, myalgias. Patient states he is symptoms began acutely yesterday, and have gradually worsened over the past 24 hours. Patient states she took her temperature yesterday and noted it to be 101F. She states she has been running a temperature of 53F since then. Patient denies having any recent sick contacts. Patient denies palpitations, dizziness, lightheadedness, weakness, nausea, vomiting, abdominal pain, diarrhea, dysuria. Patient states her myalgias or in her upper shoulders bilaterally and across her upper chest. She states her pain is made worse with movement, coughing. She states her cough has been productive and her sputum has been yellow in color.  Past Medical History  Diagnosis Date  . Gastroparesis   . Gout   . Hypotension   . Herpes genitalia 1994  . E coli bacteremia 06/18/2011  . Bacteremia due to Gram-negative bacteria 05/23/2011  . History of blood transfusion     "more than a few times" (12/11/2011)  . History of stomach ulcers   . Stroke      left basal ganglia lacunar infarct; Right frontal lobe lacunar infarct.  . Stroke ~ 1999; 2001    "briefly lost my vision; lost my right eye" (12/11/2011)  . Chronic lower back pain   . Depression   . Osteopenia   . Headache(784.0)     "not often anymore" (12/11/2011)  . Complication of anesthesia     "woke up during OR; I have an extremely high tolerance" (12/11/2011)  . Dysrhythmia     "tachycardia" (12/11/2011)  . ESRD (end stage renal  disease) 06/12/2011  . Iron deficiency anemia   . Seizures 1994    "post transplant; only have had that one" (12/11/2011)  . GERD (gastroesophageal reflux disease)    Past Surgical History  Procedure Laterality Date  . Kidney transplant  1994; 1999; 2005    "right"  . Appendectomy  ~ 2004  . 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 Total nephrectomy  1988?; 1994; 2005  . Multiple tooth extractions    . Enucleation  2001    "right"  . Esophagogastroduodenoscopy (egd) with propofol N/A 04/21/2012    Procedure: ESOPHAGOGASTRODUODENOSCOPY (EGD) WITH PROPOFOL;  Surgeon: Milus Banister, MD;  Location: [facility];  Service: Endoscopy;  Laterality: N/A;   Family History  Problem Relation Age of Onset  . Hypertension Maternal Grandmother    History  Substance Use Topics  . Smoking status: Current Some Day Smoker -- 0.50 packs/day for 28 years    Types: Cigarettes  . Smokeless tobacco: Never Used     Comment: had quit 03/05/2011  . Alcohol Use: Yes     Comment: uses daily   OB History   Grav Para Term Preterm Abortions TAB SAB Ect Mult Living                 Review of Systems  Constitutional: Positive for fever. Negative for diaphoresis.  HENT: Positive for  congestion and sore throat. Negative for rhinorrhea, trouble swallowing and voice change.   Eyes: Negative for visual disturbance.  Respiratory: Positive for cough and shortness of breath. Negative for stridor.   Cardiovascular: Negative for chest pain and palpitations.  Gastrointestinal: Negative for nausea, vomiting and abdominal pain.  Genitourinary: Negative for dysuria, frequency, flank pain, vaginal bleeding, vaginal discharge and vaginal pain.  Musculoskeletal: Positive for myalgias. Negative for back pain, neck pain and neck stiffness.  Skin: Negative for rash.  Neurological: Negative for dizziness, syncope, weakness and numbness.  Psychiatric/Behavioral:  Negative.       Allergies  Levofloxacin  Home Medications   Prior to Admission medications   Medication Sig Start Date End Date Taking? Authorizing Provider  aspirin EC 81 MG tablet Take 81 mg by mouth daily.    Historical Provider, MD  azaTHIOprine (IMURAN) 50 MG tablet Take 100 mg by mouth daily.     Historical Provider, MD  colchicine 0.6 MG tablet Take 0.6 mg by mouth daily as needed (For gout.).    Historical Provider, MD  Darbepoetin Alfa-Albumin (ARANESP IJ) Inject as directed as directed.    Historical Provider, MD  dicyclomine (BENTYL) 20 MG tablet Take 20 mg by mouth 2 (two) times daily as needed (for abdominal cramping).    Historical Provider, MD  furosemide (LASIX) 40 MG tablet Take 40 mg by mouth every evening.  06/22/11   [name], MD  metoCLOPramide (REGLAN) 5 MG tablet TAKE 1 TABLET (5 MG TOTAL) BY MOUTH AT BEDTIME AND MAY REPEAT DOSE ONE TIME IF NEEDED. 09/13/13   Milus Banister, MD  Multiple Vitamin (MULTIVITAMIN) tablet Take 1 tablet by mouth daily.    Historical Provider, MD  nystatin (MYCOSTATIN) 100000 UNIT/ML suspension Take 5 mLs (500,000 Units total) by mouth 4 (four) times daily. 09/13/13   Milus Banister, MD  omeprazole (PRILOSEC) 40 MG capsule Take 40 mg by mouth 2 (two) times daily.     Historical Provider, MD  ondansetron (ZOFRAN-ODT) 4 MG disintegrating tablet Take 1 tablet (4 mg total) by mouth every 6 (six) hours. 05/18/13   Amy S Esterwood, PA-C  oxyCODONE (ROXICODONE) 5 MG immediate release tablet Take 1 tab daily as needed for pain. 05/18/13   Amy S Esterwood, PA-C  oxyCODONE-acetaminophen (PERCOCET) 7.5-325 MG per tablet Take 1 tablet by mouth every 4 (four) hours as needed for pain.    Historical Provider, MD  predniSONE (DELTASONE) 5 MG tablet Take 5 mg by mouth daily. 06/22/11   [name], MD  promethazine (PHENERGAN) 25 MG suppository Place 1 suppository (25 mg total) rectally every 6 (six) hours as needed for nausea or vomiting.  05/18/13   Amy S Esterwood, PA-C  promethazine (PHENERGAN) 25 MG tablet Take 1 tablet (25 mg total) by mouth every 6 (six) hours as needed for nausea or vomiting. 05/07/13   Margarita Mail, PA-C  tacrolimus (PROGRAF) 1 MG capsule Take 3 mg by mouth 2 (two) times daily.  06/22/11   [name], MD   BP 109/66  Pulse 76  Temp(Src) 99 F (37.2 C) (Oral)  Resp 20  Ht 4\' 11"  (1.499 m)  Wt 112 lb (50.803 kg)  BMI 22.61 kg/m2  SpO2 98% Physical Exam  Nursing note and vitals reviewed. Constitutional: She is oriented to person, place, and time. She appears well-developed and well-nourished. No distress.  HENT:  Head: Normocephalic and atraumatic.  Mouth/Throat: Uvula is midline. No trismus in the jaw. No uvula swelling. Posterior  oropharyngeal erythema present. No oropharyngeal exudate, posterior oropharyngeal edema or tonsillar abscesses.  Eyes: Pupils are equal, round, and reactive to light. Right eye exhibits no discharge. Left eye exhibits no discharge. No scleral icterus.  Neck: Normal range of motion.  Cardiovascular: Normal rate, regular rhythm and normal heart sounds.   No murmur heard. Pulmonary/Chest: Breath sounds normal. No accessory muscle usage. Tachypnea noted. No respiratory distress. She has no wheezes. She has no rhonchi. She has no rales.      Abdominal: Soft. Normal appearance and bowel sounds are normal. There is generalized tenderness.  Entire abdomen mildly painful with coughing.  Musculoskeletal: Normal range of motion. She exhibits no edema and no tenderness.  Neurological: She is alert and oriented to person, place, and time. No cranial nerve deficit. Coordination normal.  Skin: Skin is warm and dry. No rash noted. She is not diaphoretic.  Psychiatric: She has a normal mood and affect.    ED Course  Procedures (including critical care time) Labs Review Labs Reviewed  RAPID STREP SCREEN  CULTURE, GROUP A STREP    Imaging Review Dg Chest 2 View (if  Patient Has Fever And/or Copd)  10/14/2013   CLINICAL DATA:  Sore throat, chest pain and fever.  Smoker.  EXAM: CHEST  2 VIEW  COMPARISON:  Chest radiograph 05/26/2013  FINDINGS: A stent projects over the superior vena cava and is stable. Mild cardiomegaly is stable. Mediastinal and hilar contours are stable. The trachea is midline. Pulmonary vascularity is mildly congested. No definite pulmonary edema. No focal consolidation or pleural effusion. Negative for pneumothorax. There are surgical clips in the upper abdominal retroperitoneum. Visualized bowel gas pattern nonobstructive. No acute bony abnormality.  IMPRESSION: Stable mild cardiomegaly with mild pulmonary vascular congestion. Otherwise, the lungs are clear.  Superior vena cava stent again noted.   Electronically Signed   By: Curlene Dolphin M.D.   On: 10/14/2013 16:15     EKG Interpretation None      MDM   Final diagnoses:  Upper respiratory infection    48 year old female kidney transplant patient with approximately 24 hours of sore throat, shortness of breath, productive cough, headache, myalgias, subjective fever up to 101F. Patient with temperature of 75F. Concern for pneumonia, upper respiratory infection. Patient mildly anxious, and tachypnea on exam. No respiratory distress, no accessory muscle usage, patients with normal breathing effort. Patient offered Tylenol for her sore throat, and states that she takes Percocet at home, and only responds to Percocet for her pain. We will treat patient's pain here and workup to include strep test, and chest x-ray. Wells score negative for PE.   After treatment on reexam, patient is no longer tachypneic, states she is feeling less pain. Rapid strep negative, culture 4 GAS in process. Chest x-ray unremarkable for any pneumonia. We will discharge patient at this time. I encouraged patient to follow up with her primary care physician. I also encouraged patient to use Tylenol or her prescribed  Percocet at home for her discomfort. I recommended she not use both due to the Tylenol in the Percocet. Patient was agreeable to this plan. I encouraged patient to call or return to the ER should her symptoms persist, change, worsen or should she have any questions or concerns.   BP 109/66  Pulse 76  Temp(Src) 99 F (37.2 C) (Oral)  Resp 20  Ht 4\' 11"  (1.499 m)  Wt 112 lb (50.803 kg)  BMI 22.61 kg/m2  SpO2 98%   Signed,  Dahlia Bailiff,  PA-C 2:03 AM  This patient discussed with Dr. Davonna Belling, M.D.    Carrie Mew, PA-C 10/15/13 [phone]

## 2013-10-14 NOTE — Discharge Instructions (Signed)
Your rapid strep test was negative. Your symptoms are consistent with a viral respiratory infection. You may use Tylenol 500 mg one to 2 tablets every 4-6 hours for pain and fever OR your prescribed Percocet for pain. Since Percocet contains Tylenol, do not combine these 2 medications. Followup with your primary care physician, and return to the ER should her symptoms persist, worsen or should you have any questions or concerns.   Upper Respiratory Infection, Adult An upper respiratory infection (URI) is also sometimes known as the common cold. The upper respiratory tract includes the nose, sinuses, throat, trachea, and bronchi. Bronchi are the airways leading to the lungs. Most people improve within 1 week, but symptoms can last up to 2 weeks. A residual cough may last even longer.  CAUSES Many different viruses can infect the tissues lining the upper respiratory tract. The tissues become irritated and inflamed and often become very moist. Mucus production is also common. A cold is contagious. You can easily spread the virus to others by oral contact. This includes kissing, sharing a glass, coughing, or sneezing. Touching your mouth or nose and then touching a surface, which is then touched by another person, can also spread the virus. SYMPTOMS  Symptoms typically develop 1 to 3 days after you come in contact with a cold virus. Symptoms vary from person to person. They may include:  Runny nose.  Sneezing.  Nasal congestion.  Sinus irritation.  Sore throat.  Loss of voice (laryngitis).  Cough.  Fatigue.  Muscle aches.  Loss of appetite.  Headache.  Low-grade fever. DIAGNOSIS  You might diagnose your own cold based on familiar symptoms, since most people get a cold 2 to 3 times a year. Your caregiver can confirm this based on your exam. Most importantly, your caregiver can check that your symptoms are not due to another disease such as strep throat, sinusitis, pneumonia, asthma, or  epiglottitis. Blood tests, throat tests, and X-rays are not necessary to diagnose a common cold, but they may sometimes be helpful in excluding other more serious diseases. Your caregiver will decide if any further tests are required. RISKS AND COMPLICATIONS  You may be at risk for a more severe case of the common cold if you smoke cigarettes, have chronic heart disease (such as heart failure) or lung disease (such as asthma), or if you have a weakened immune system. The very young and very old are also at risk for more serious infections. Bacterial sinusitis, middle ear infections, and bacterial pneumonia can complicate the common cold. The common cold can worsen asthma and chronic obstructive pulmonary disease (COPD). Sometimes, these complications can require emergency medical care and may be life-threatening. PREVENTION  The best way to protect against getting a cold is to practice good hygiene. Avoid oral or hand contact with people with cold symptoms. Wash your hands often if contact occurs. There is no clear evidence that vitamin C, vitamin E, echinacea, or exercise reduces the chance of developing a cold. However, it is always recommended to get plenty of rest and practice good nutrition. TREATMENT  Treatment is directed at relieving symptoms. There is no cure. Antibiotics are not effective, because the infection is caused by a virus, not by bacteria. Treatment may include:  Increased fluid intake. Sports drinks offer valuable electrolytes, sugars, and fluids.  Breathing heated mist or steam (vaporizer or shower).  Eating chicken soup or other clear broths, and maintaining good nutrition.  Getting plenty of rest.  Using gargles or lozenges for  comfort.  Controlling fevers with ibuprofen or acetaminophen as directed by your caregiver.  Increasing usage of your inhaler if you have asthma. Zinc gel and zinc lozenges, taken in the first 24 hours of the common cold, can shorten the duration  and lessen the severity of symptoms. Pain medicines may help with fever, muscle aches, and throat pain. A variety of non-prescription medicines are available to treat congestion and runny nose. Your caregiver can make recommendations and may suggest nasal or lung inhalers for other symptoms.  HOME CARE INSTRUCTIONS   Only take over-the-counter or prescription medicines for pain, discomfort, or fever as directed by your caregiver.  Use a warm mist humidifier or inhale steam from a shower to increase air moisture. This may keep secretions moist and make it easier to breathe.  Drink enough water and fluids to keep your urine clear or pale yellow.  Rest as needed.  Return to work when your temperature has returned to normal or as your caregiver advises. You may need to stay home longer to avoid infecting others. You can also use a face mask and careful hand washing to prevent spread of the virus. SEEK MEDICAL CARE IF:   After the first few days, you feel you are getting worse rather than better.  You need your caregiver's advice about medicines to control symptoms.  You develop chills, worsening shortness of breath, or brown or red sputum. These may be signs of pneumonia.  You develop yellow or brown nasal discharge or pain in the face, especially when you bend forward. These may be signs of sinusitis.  You develop a fever, swollen neck glands, pain with swallowing, or white areas in the back of your throat. These may be signs of strep throat. SEEK IMMEDIATE MEDICAL CARE IF:   You have a fever.  You develop severe or persistent headache, ear pain, sinus pain, or chest pain.  You develop wheezing, a prolonged cough, cough up blood, or have a change in your usual mucus (if you have chronic lung disease).  You develop sore muscles or a stiff neck. Document Released: 07/15/2000 Document Revised: 04/13/2011 Document Reviewed: 04/26/2013 [facility] Patient Information 2015 [place], [place]. This  information is not intended to replace advice given to you by your health care provider. Make sure you discuss any questions you have with your health care provider.

## 2013-10-15 NOTE — ED Provider Notes (Signed)
Medical screening examination/treatment/procedure(s) were conducted as a shared visit with non-physician practitioner(s) and myself.  I personally evaluated the patient during the encounter.   EKG Interpretation None     Fever. Renal transplant. Likely viral. Strep and xray negative. Overall well apprearing. D/c.  Tina Watkins. Alvino Chapel, MD 10/15/13 1545

## 2013-10-16 LAB — CULTURE, GROUP A STREP

## 2013-11-03 ENCOUNTER — Encounter (HOSPITAL_COMMUNITY)
Admission: RE | Admit: 2013-11-03 | Discharge: 2013-11-03 | Disposition: A | Discharge status: Home / Self Care | Payer: Medicaid Other | Source: Ambulatory Visit | Attending: Nephrology | Admitting: Nephrology

## 2013-11-03 DIAGNOSIS — Z940 Kidney transplant status: Secondary | ICD-10-CM | POA: Diagnosis not present

## 2013-11-03 DIAGNOSIS — D638 Anemia in other chronic diseases classified elsewhere: Secondary | ICD-10-CM | POA: Diagnosis not present

## 2013-11-03 LAB — RENAL FUNCTION PANEL
Albumin: 4 g/dL (ref 3.5–5.2)
Anion gap: 12 (ref 5–15)
BUN: 41 mg/dL — ABNORMAL HIGH (ref 6–23)
CO2: 27 mEq/L (ref 19–32)
Calcium: 9.9 mg/dL (ref 8.4–10.5)
Chloride: 101 mEq/L (ref 96–112)
Creatinine, Ser: 2.12 mg/dL — ABNORMAL HIGH (ref 0.50–1.10)
GFR calc Af Amer: 31 mL/min — ABNORMAL LOW (ref >90)
GFR calc non Af Amer: 27 mL/min — ABNORMAL LOW (ref >90)
Glucose, Bld: 77 mg/dL (ref 70–99)
Phosphorus: 3.8 mg/dL (ref 2.3–4.6)
Potassium: 4.6 mEq/L (ref 3.7–5.3)
Sodium: 140 mEq/L (ref 137–147)

## 2013-11-03 LAB — FERRITIN: Ferritin: 1460 ng/mL — ABNORMAL HIGH (ref 10–291)

## 2013-11-03 LAB — IRON AND TIBC
Iron: 60 ug/dL (ref 42–135)
Saturation Ratios: 30 % (ref 20–55)
TIBC: 200 ug/dL — ABNORMAL LOW (ref 250–470)
UIBC: 140 ug/dL (ref 125–400)

## 2013-11-03 LAB — POCT HEMOGLOBIN-HEMACUE: Hemoglobin: 8.6 g/dL — ABNORMAL LOW (ref 12.0–15.0)

## 2013-11-03 MED ORDER — EPOETIN ALFA 40000 UNIT/ML IJ SOLN
INTRAMUSCULAR | Status: AC
  Administered 2013-11-03: 40000 [IU] via SUBCUTANEOUS
  Filled 2013-11-03: qty 1

## 2013-11-03 MED ORDER — EPOETIN ALFA 20000 UNIT/ML IJ SOLN: 40000.0000 [IU] | INTRAMUSCULAR | Status: DC

## 2013-12-08 ENCOUNTER — Encounter (HOSPITAL_COMMUNITY)
Admission: RE | Admit: 2013-12-08 | Discharge: 2013-12-08 | Disposition: A | Discharge status: Home / Self Care | Payer: Medicaid Other | Source: Ambulatory Visit | Attending: Nephrology | Admitting: Nephrology

## 2013-12-08 DIAGNOSIS — N185 Chronic kidney disease, stage 5: Secondary | ICD-10-CM | POA: Insufficient documentation

## 2013-12-08 DIAGNOSIS — D638 Anemia in other chronic diseases classified elsewhere: Secondary | ICD-10-CM | POA: Diagnosis present

## 2013-12-08 DIAGNOSIS — D631 Anemia in chronic kidney disease: Secondary | ICD-10-CM | POA: Insufficient documentation

## 2013-12-08 DIAGNOSIS — Z940 Kidney transplant status: Secondary | ICD-10-CM | POA: Diagnosis not present

## 2013-12-08 LAB — RENAL FUNCTION PANEL
Albumin: 3.9 g/dL (ref 3.5–5.2)
Anion gap: 16 — ABNORMAL HIGH (ref 5–15)
BUN: 41 mg/dL — ABNORMAL HIGH (ref 6–23)
CO2: 21 mEq/L (ref 19–32)
Calcium: 9.8 mg/dL (ref 8.4–10.5)
Chloride: 102 mEq/L (ref 96–112)
Creatinine, Ser: 2.43 mg/dL — ABNORMAL HIGH (ref 0.50–1.10)
GFR calc Af Amer: 26 mL/min — ABNORMAL LOW (ref >90)
GFR calc non Af Amer: 23 mL/min — ABNORMAL LOW (ref >90)
Glucose, Bld: 93 mg/dL (ref 70–99)
Phosphorus: 3.8 mg/dL (ref 2.3–4.6)
Potassium: 4.5 mEq/L (ref 3.7–5.3)
Sodium: 139 mEq/L (ref 137–147)

## 2013-12-08 LAB — POCT HEMOGLOBIN-HEMACUE: Hemoglobin: 9.8 g/dL — ABNORMAL LOW (ref 12.0–15.0)

## 2013-12-08 MED ORDER — EPOETIN ALFA 20000 UNIT/ML IJ SOLN: 40000.0000 [IU] | INTRAMUSCULAR | Status: DC

## 2013-12-08 MED ORDER — EPOETIN ALFA 40000 UNIT/ML IJ SOLN
INTRAMUSCULAR | Status: AC
Start: 2013-12-08 — End: 2013-12-08
  Administered 2013-12-08: 40000 [IU] via SUBCUTANEOUS
  Filled 2013-12-08: qty 1

## 2014-01-05 ENCOUNTER — Encounter (HOSPITAL_COMMUNITY)
Admission: RE | Admit: 2014-01-05 | Discharge: 2014-01-05 | Disposition: A | Discharge status: Home / Self Care | Payer: Medicaid Other | Source: Ambulatory Visit | Attending: Nephrology | Admitting: Nephrology

## 2014-01-05 DIAGNOSIS — D631 Anemia in chronic kidney disease: Secondary | ICD-10-CM | POA: Diagnosis not present

## 2014-01-05 DIAGNOSIS — Z940 Kidney transplant status: Secondary | ICD-10-CM | POA: Diagnosis not present

## 2014-01-05 DIAGNOSIS — N185 Chronic kidney disease, stage 5: Secondary | ICD-10-CM | POA: Diagnosis not present

## 2014-01-05 LAB — RENAL FUNCTION PANEL
Albumin: 4 g/dL (ref 3.5–5.2)
Anion gap: 13 (ref 5–15)
BUN: 28 mg/dL — ABNORMAL HIGH (ref 6–23)
CO2: 26 mEq/L (ref 19–32)
Calcium: 9.8 mg/dL (ref 8.4–10.5)
Chloride: 102 mEq/L (ref 96–112)
Creatinine, Ser: 2.27 mg/dL — ABNORMAL HIGH (ref 0.50–1.10)
GFR calc Af Amer: 29 mL/min — ABNORMAL LOW (ref >90)
GFR calc non Af Amer: 25 mL/min — ABNORMAL LOW (ref >90)
Glucose, Bld: 83 mg/dL (ref 70–99)
Phosphorus: 3.1 mg/dL (ref 2.3–4.6)
Potassium: 4.3 mEq/L (ref 3.7–5.3)
Sodium: 141 mEq/L (ref 137–147)

## 2014-01-05 LAB — IRON AND TIBC
Iron: 53 ug/dL (ref 42–135)
Saturation Ratios: 27 % (ref 20–55)
TIBC: 196 ug/dL — ABNORMAL LOW (ref 250–470)
UIBC: 143 ug/dL (ref 125–400)

## 2014-01-05 LAB — FERRITIN: Ferritin: 1489 ng/mL — ABNORMAL HIGH (ref 10–291)

## 2014-01-05 LAB — POCT HEMOGLOBIN-HEMACUE: Hemoglobin: 9.8 g/dL — ABNORMAL LOW (ref 12.0–15.0)

## 2014-01-05 MED ORDER — EPOETIN ALFA 20000 UNIT/ML IJ SOLN
40000.0000 [IU] | INTRAMUSCULAR | Status: DC
  Administered 2014-01-05: 40000 [IU] via SUBCUTANEOUS

## 2014-01-17 ENCOUNTER — Other Ambulatory Visit: Payer: Self-pay | Admitting: Nurse Practitioner

## 2014-01-23 ENCOUNTER — Encounter (HOSPITAL_COMMUNITY): Payer: Self-pay | Admitting: Emergency Medicine

## 2014-01-23 ENCOUNTER — Emergency Department (HOSPITAL_COMMUNITY)
Admission: EM | Admit: 2014-01-23 | Discharge: 2014-01-24 | Disposition: A | Discharge status: Home / Self Care | Payer: Medicaid Other | Attending: Emergency Medicine | Admitting: Emergency Medicine

## 2014-01-23 DIAGNOSIS — Z862 Personal history of diseases of the blood and blood-forming organs and certain disorders involving the immune mechanism: Secondary | ICD-10-CM | POA: Diagnosis not present

## 2014-01-23 DIAGNOSIS — Z3202 Encounter for pregnancy test, result negative: Secondary | ICD-10-CM | POA: Insufficient documentation

## 2014-01-23 DIAGNOSIS — Z8673 Personal history of transient ischemic attack (TIA), and cerebral infarction without residual deficits: Secondary | ICD-10-CM | POA: Insufficient documentation

## 2014-01-23 DIAGNOSIS — R1084 Generalized abdominal pain: Secondary | ICD-10-CM | POA: Diagnosis present

## 2014-01-23 DIAGNOSIS — Z8659 Personal history of other mental and behavioral disorders: Secondary | ICD-10-CM | POA: Insufficient documentation

## 2014-01-23 DIAGNOSIS — K219 Gastro-esophageal reflux disease without esophagitis: Secondary | ICD-10-CM | POA: Diagnosis not present

## 2014-01-23 DIAGNOSIS — N186 End stage renal disease: Secondary | ICD-10-CM | POA: Diagnosis not present

## 2014-01-23 DIAGNOSIS — Z8679 Personal history of other diseases of the circulatory system: Secondary | ICD-10-CM | POA: Insufficient documentation

## 2014-01-23 DIAGNOSIS — M109 Gout, unspecified: Secondary | ICD-10-CM | POA: Diagnosis not present

## 2014-01-23 DIAGNOSIS — Z7982 Long term (current) use of aspirin: Secondary | ICD-10-CM | POA: Insufficient documentation

## 2014-01-23 DIAGNOSIS — Z8619 Personal history of other infectious and parasitic diseases: Secondary | ICD-10-CM | POA: Diagnosis not present

## 2014-01-23 DIAGNOSIS — Z720 Tobacco use: Secondary | ICD-10-CM | POA: Diagnosis not present

## 2014-01-23 DIAGNOSIS — G8929 Other chronic pain: Secondary | ICD-10-CM | POA: Insufficient documentation

## 2014-01-23 DIAGNOSIS — Z79899 Other long term (current) drug therapy: Secondary | ICD-10-CM | POA: Insufficient documentation

## 2014-01-23 DIAGNOSIS — K3184 Gastroparesis: Secondary | ICD-10-CM

## 2014-01-23 DIAGNOSIS — M545 Low back pain: Secondary | ICD-10-CM | POA: Insufficient documentation

## 2014-01-23 DIAGNOSIS — R112 Nausea with vomiting, unspecified: Secondary | ICD-10-CM

## 2014-01-23 LAB — CBC WITH DIFFERENTIAL/PLATELET
Basophils Absolute: 0 10*3/uL (ref 0.0–0.1)
Basophils Relative: 0 % (ref 0–1)
Eosinophils Absolute: 0 10*3/uL (ref 0.0–0.7)
Eosinophils Relative: 1 % (ref 0–5)
HCT: 32.3 % — ABNORMAL LOW (ref 36.0–46.0)
Hemoglobin: 10.6 g/dL — ABNORMAL LOW (ref 12.0–15.0)
Lymphocytes Relative: 10 % — ABNORMAL LOW (ref 12–46)
Lymphs Abs: 0.4 10*3/uL — ABNORMAL LOW (ref 0.7–4.0)
MCH: 30 pg (ref 26.0–34.0)
MCHC: 32.8 g/dL (ref 30.0–36.0)
MCV: 91.5 fL (ref 78.0–100.0)
Monocytes Absolute: 0.4 10*3/uL (ref 0.1–1.0)
Monocytes Relative: 8 % (ref 3–12)
Neutro Abs: 3.7 10*3/uL (ref 1.7–7.7)
Neutrophils Relative %: 81 % — ABNORMAL HIGH (ref 43–77)
Platelets: 191 10*3/uL (ref 150–400)
RBC: 3.53 MIL/uL — ABNORMAL LOW (ref 3.87–5.11)
RDW: 19.5 % — ABNORMAL HIGH (ref 11.5–15.5)
WBC: 4.5 10*3/uL (ref 4.0–10.5)

## 2014-01-23 NOTE — ED Notes (Signed)
Pt. reports generalized abdominal pain with nausea , vomitting and low back pain onset this morning , denies fever or chills/ no urinary discomfort.

## 2014-01-23 NOTE — ED Notes (Signed)
The patient is unable to give an urine specimen at this time. The tech has reported to the RN in charge. 

## 2014-01-24 ENCOUNTER — Telehealth: Payer: Self-pay | Admitting: Gastroenterology

## 2014-01-24 LAB — URINE MICROSCOPIC-ADD ON

## 2014-01-24 LAB — URINALYSIS, ROUTINE W REFLEX MICROSCOPIC
Bilirubin Urine: NEGATIVE
Glucose, UA: NEGATIVE mg/dL
Hgb urine dipstick: NEGATIVE
Ketones, ur: NEGATIVE mg/dL
Leukocytes, UA: NEGATIVE
Nitrite: NEGATIVE
Protein, ur: 100 mg/dL — AB
Specific Gravity, Urine: 1.015 (ref 1.005–1.030)
Urobilinogen, UA: 0.2 mg/dL (ref 0.0–1.0)
pH: 8 (ref 5.0–8.0)

## 2014-01-24 LAB — COMPREHENSIVE METABOLIC PANEL
ALT: 15 U/L (ref 0–35)
AST: 25 U/L (ref 0–37)
Albumin: 4.6 g/dL (ref 3.5–5.2)
Alkaline Phosphatase: 98 U/L (ref 39–117)
Anion gap: 9 (ref 5–15)
BUN: 40 mg/dL — ABNORMAL HIGH (ref 6–23)
CO2: 23 mmol/L (ref 19–32)
Calcium: 10.4 mg/dL (ref 8.4–10.5)
Chloride: 106 mEq/L (ref 96–112)
Creatinine, Ser: 2.3 mg/dL — ABNORMAL HIGH (ref 0.50–1.10)
GFR calc Af Amer: 28 mL/min — ABNORMAL LOW (ref >90)
GFR calc non Af Amer: 24 mL/min — ABNORMAL LOW (ref >90)
Glucose, Bld: 105 mg/dL — ABNORMAL HIGH (ref 70–99)
Potassium: 3.9 mmol/L (ref 3.5–5.1)
Sodium: 138 mmol/L (ref 135–145)
Total Bilirubin: 0.8 mg/dL (ref 0.3–1.2)
Total Protein: 7.4 g/dL (ref 6.0–8.3)

## 2014-01-24 LAB — PREGNANCY, URINE: Preg Test, Ur: NEGATIVE

## 2014-01-24 LAB — LIPASE, BLOOD: Lipase: 33 U/L (ref 11–59)

## 2014-01-24 MED ORDER — HYDROMORPHONE HCL 1 MG/ML IJ SOLN
1.0000 mg | Freq: Once | INTRAMUSCULAR | Status: AC
  Administered 2014-01-24: 1 mg via INTRAVENOUS
  Filled 2014-01-24: qty 1

## 2014-01-24 MED ORDER — SODIUM CHLORIDE 0.9 % IV BOLUS (SEPSIS)
1000.0000 mL | Freq: Once | INTRAVENOUS | Status: AC
  Administered 2014-01-24: 1000 mL via INTRAVENOUS

## 2014-01-24 MED ORDER — DICYCLOMINE HCL 10 MG/ML IM SOLN
20.0000 mg | Freq: Once | INTRAMUSCULAR | Status: AC
  Administered 2014-01-24: 20 mg via INTRAMUSCULAR
  Filled 2014-01-24: qty 2

## 2014-01-24 MED ORDER — LORAZEPAM 2 MG/ML IJ SOLN
1.0000 mg | Freq: Once | INTRAMUSCULAR | Status: AC
  Administered 2014-01-24: 1 mg via INTRAVENOUS
  Filled 2014-01-24: qty 1

## 2014-01-24 MED ORDER — DIPHENHYDRAMINE HCL 50 MG/ML IJ SOLN
25.0000 mg | Freq: Once | INTRAMUSCULAR | Status: AC
  Administered 2014-01-24: 25 mg via INTRAVENOUS
  Filled 2014-01-24: qty 1

## 2014-01-24 MED ORDER — ONDANSETRON HCL 4 MG/2ML IJ SOLN
4.0000 mg | Freq: Once | INTRAMUSCULAR | Status: AC
  Administered 2014-01-24: 4 mg via INTRAVENOUS
  Filled 2014-01-24: qty 2

## 2014-01-24 MED ORDER — METOCLOPRAMIDE HCL 5 MG/ML IJ SOLN
10.0000 mg | Freq: Once | INTRAMUSCULAR | Status: AC
  Administered 2014-01-24: 10 mg via INTRAVENOUS
  Filled 2014-01-24: qty 2

## 2014-01-24 NOTE — ED Notes (Signed)
Family at bedside. 

## 2014-01-24 NOTE — ED Notes (Signed)
Pt A&OX4, ambulatory at d/c with steady gait, NAD 

## 2014-01-24 NOTE — Telephone Encounter (Signed)
The pt wanted to discuss starting bentyl.  I advised she would need an appt to discuss any med changes since she was last seen in April.  She declined any appt's and said she will call back is she changes her mind.

## 2014-01-24 NOTE — ED Provider Notes (Signed)
CSN: [id number]     Arrival date & time 01/23/14  2254 History   First MD Initiated Contact with Patient 01/23/14 2329    This chart was scribed for Kalman Drape, MD by Terressa Koyanagi, ED Scribe. This patient was seen in room B15C/B15C and the patient's care was started at 12:04 AM.  Chief Complaint  Patient presents with  . Abdominal Pain   The history is provided by the patient and a parent. No language interpreter was used.   HPI Comments: Tina Watkins is a 48 y.o. female, with an extensive medical Hx noted below and significant for gastroparesis, renal tx x 3, who presents to the Emergency Department complaining of acute on chronic, atraumatic, intermittent, generalized abd pain with associated n/v and lower back pain onset this morning. Pt specifies that the Pt reports having a bowel movement this morning. Pt notes she has experienced similar Sx in the past. Pt denies fever, chills, diarrhea, urinary Sx. Pt reports she generally uses reglan, however, took today without improvement.    Per mother pt had a successful third Kidney transplant recently and is no longer on dialysis. Pt denies any Hx of bowel obstructions.   Past Medical History  Diagnosis Date  . Gastroparesis   . Gout   . Hypotension   . Herpes genitalia 1994  . E coli bacteremia 06/18/2011  . Bacteremia due to Gram-negative bacteria 05/23/2011  . History of blood transfusion     "more than a few times" (12/11/2011)  . History of stomach ulcers   . Stroke      left basal ganglia lacunar infarct; Right frontal lobe lacunar infarct.  . Stroke ~ 1999; 2001    "briefly lost my vision; lost my right eye" (12/11/2011)  . Chronic lower back pain   . Depression   . Osteopenia   . Headache(784.0)     "not often anymore" (12/11/2011)  . Complication of anesthesia     "woke up during OR; I have an extremely high tolerance" (12/11/2011)  . Dysrhythmia     "tachycardia" (12/11/2011)  . ESRD (end stage renal disease) 06/12/2011   . Iron deficiency anemia   . Seizures 1994    "post transplant; only have had that one" (12/11/2011)  . GERD (gastroesophageal reflux disease)    Past Surgical History  Procedure Laterality Date  . Kidney transplant  1994; 1999; 2005    "right"  . Appendectomy  ~ 2004  . 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 Total nephrectomy  1988?; 1994; 2005  . Multiple tooth extractions    . Enucleation  2001    "right"  . Esophagogastroduodenoscopy (egd) with propofol N/A 04/21/2012    Procedure: ESOPHAGOGASTRODUODENOSCOPY (EGD) WITH PROPOFOL;  Surgeon: Milus Banister, MD;  Location: [facility] END[facility];  Service: Endoscopy;  Laterality: N/A;   Family History  Problem Relation Age of Onset  . Hypertension Maternal Grandmother    History  Substance Use Topics  . Smoking status: Current Some Day Smoker -- 0.50 packs/day for 28 years    Types: Cigarettes  . Smokeless tobacco: Never Used     Comment: had quit 03/05/2011  . Alcohol Use: Yes     Comment: uses daily   OB History    No data available     Review of Systems  Gastrointestinal: Positive for nausea, vomiting and abdominal pain. Negative for diarrhea.  Musculoskeletal:  Positive for back pain.  Psychiatric/Behavioral: Negative for confusion.      Allergies  Levofloxacin  Home Medications   Prior to Admission medications   Medication Sig Start Date End Date Taking? Authorizing Provider  aspirin EC 81 MG tablet Take 81 mg by mouth daily.    Historical Provider, MD  azaTHIOprine (IMURAN) 50 MG tablet Take 100 mg by mouth daily.     Historical Provider, MD  colchicine 0.6 MG tablet Take 0.6 mg by mouth daily as needed (For gout.).    Historical Provider, MD  Darbepoetin Alfa-Albumin (ARANESP IJ) Inject as directed as directed.    Historical Provider, MD  dicyclomine (BENTYL) 20 MG tablet Take 20 mg by mouth 2 (two) times daily as needed (for abdominal cramping).     Historical Provider, MD  furosemide (LASIX) 40 MG tablet Take 40 mg by mouth every evening.  06/22/11   [name], MD  metoCLOPramide (REGLAN) 5 MG tablet TAKE 1 TABLET (5 MG TOTAL) BY MOUTH AT BEDTIME AND MAY REPEAT DOSE ONE TIME IF NEEDED. 09/13/13   Milus Banister, MD  Multiple Vitamin (MULTIVITAMIN) tablet Take 1 tablet by mouth daily.    Historical Provider, MD  nystatin (MYCOSTATIN) 100000 UNIT/ML suspension Take 5 mLs (500,000 Units total) by mouth 4 (four) times daily. 09/13/13   Milus Banister, MD  omeprazole (PRILOSEC) 40 MG capsule Take 40 mg by mouth 2 (two) times daily.     Historical Provider, MD  ondansetron (ZOFRAN-ODT) 4 MG disintegrating tablet Take 1 tablet (4 mg total) by mouth every 6 (six) hours. 05/18/13   Amy S Esterwood, PA-C  oxyCODONE (ROXICODONE) 5 MG immediate release tablet Take 1 tab daily as needed for pain. 05/18/13   Amy S Esterwood, PA-C  oxyCODONE-acetaminophen (PERCOCET) 7.5-325 MG per tablet Take 1 tablet by mouth every 4 (four) hours as needed for pain.    Historical Provider, MD  predniSONE (DELTASONE) 5 MG tablet Take 5 mg by mouth daily. 06/22/11   [name], MD  promethazine (PHENERGAN) 25 MG suppository Place 1 suppository (25 mg total) rectally every 6 (six) hours as needed for nausea or vomiting. 05/18/13   Amy S Esterwood, PA-C  promethazine (PHENERGAN) 25 MG tablet Take 1 tablet (25 mg total) by mouth every 6 (six) hours as needed for nausea or vomiting. 05/07/13   Margarita Mail, PA-C  tacrolimus (PROGRAF) 1 MG capsule Take 3 mg by mouth 2 (two) times daily.  06/22/11   [name], MD   Triage Vitals: BP 103/72 mmHg  Pulse 79  Temp(Src) 98.3 F (36.8 C) (Oral)  Resp 14  Ht 4\' 11"  (1.499 m)  Wt 115 lb (52.164 kg)  BMI 23.21 kg/m2  SpO2 100% Physical Exam  Constitutional: She is oriented to person, place, and time. She appears well-developed and well-nourished. She appears distressed.  HENT:  Head: Normocephalic and  atraumatic.  Nose: Nose normal.  Dry mucous membranes  Eyes: Conjunctivae and EOM are normal.  Neck: Normal range of motion. Neck supple. No JVD present. No tracheal deviation present.  Cardiovascular: Normal rate, regular rhythm, normal heart sounds and intact distal pulses.  Exam reveals no gallop and no friction rub.   No murmur heard. Pulmonary/Chest: Effort normal. No stridor. No respiratory distress. She has no wheezes. She has no rales. She exhibits no tenderness.  Abdominal: Soft. She exhibits no distension and no mass. There is tenderness. There is no rebound and no guarding.  Hypoactive bowel sounds  Musculoskeletal: Normal range of motion. She exhibits no edema or tenderness.  Lymphadenopathy:    She has no cervical adenopathy.  Neurological: She is alert and oriented to person, place, and time. She exhibits normal muscle tone. Coordination normal.  Skin: Skin is warm and dry. No rash noted. No erythema. No pallor.  Psychiatric: She has a normal mood and affect. Her behavior is normal. Judgment and thought content normal.  Nursing note and vitals reviewed.   ED Course  Procedures (including critical care time) DIAGNOSTIC STUDIES: Oxygen Saturation is 100% on RA, nl by my interpretation.    COORDINATION OF CARE: 12:09 AM-Discussed treatment plan which includes meds, labs with pt at bedside and pt agreed to plan.   Labs Review Labs Reviewed  CBC WITH DIFFERENTIAL - Abnormal; Notable for the following:    RBC 3.53 (*)    Hemoglobin 10.6 (*)    HCT 32.3 (*)    RDW 19.5 (*)    Neutrophils Relative % 81 (*)    Lymphocytes Relative 10 (*)    Lymphs Abs 0.4 (*)    All other components within normal limits  COMPREHENSIVE METABOLIC PANEL - Abnormal; Notable for the following:    Glucose, Bld 105 (*)    BUN 40 (*)    Creatinine, Ser 2.30 (*)    GFR calc non Af Amer 24 (*)    GFR calc Af Amer 28 (*)    All other components within normal limits  URINALYSIS, ROUTINE W REFLEX  MICROSCOPIC - Abnormal; Notable for the following:    Protein, ur 100 (*)    All other components within normal limits  LIPASE, BLOOD  PREGNANCY, URINE  URINE MICROSCOPIC-ADD ON    Imaging Review No results found.   EKG Interpretation None      MDM   Final diagnoses:  Gastroparesis  Nausea and vomiting, vomiting of unspecified type    48 year old female with nausea and vomiting, diffuse abdominal pain/back pain.  History of gastroparesis reports symptoms are similar to prior gastroparesis attacks.  No fevers, chills.  Has had bowel movement today.  On exam, patient is in pain, hypoactive bowel sounds.  Lab results show a stable creatinine, BUN slightly elevated.  Lab for IV hydration, Reglan Benadryl, Bentyl, Dilaudid.  Hopefully we'll be able to make patient more comfortable  I personally performed the services described in this documentation, which was scribed in my presence. The recorded information has been reviewed and is accurate.    Kalman Drape, MD 01/24/14 [phone]

## 2014-01-24 NOTE — Discharge Instructions (Signed)

## 2014-01-25 ENCOUNTER — Emergency Department (HOSPITAL_COMMUNITY): Payer: Medicaid Other

## 2014-01-25 ENCOUNTER — Emergency Department (HOSPITAL_COMMUNITY)
Admission: EM | Admit: 2014-01-25 | Discharge: 2014-01-25 | Disposition: A | Discharge status: Home / Self Care | Payer: Medicaid Other | Attending: Emergency Medicine | Admitting: Emergency Medicine

## 2014-01-25 DIAGNOSIS — G8929 Other chronic pain: Secondary | ICD-10-CM | POA: Diagnosis not present

## 2014-01-25 DIAGNOSIS — M109 Gout, unspecified: Secondary | ICD-10-CM | POA: Diagnosis not present

## 2014-01-25 DIAGNOSIS — Z720 Tobacco use: Secondary | ICD-10-CM | POA: Diagnosis not present

## 2014-01-25 DIAGNOSIS — R109 Unspecified abdominal pain: Secondary | ICD-10-CM | POA: Diagnosis present

## 2014-01-25 DIAGNOSIS — K3184 Gastroparesis: Secondary | ICD-10-CM | POA: Diagnosis not present

## 2014-01-25 DIAGNOSIS — Z8673 Personal history of transient ischemic attack (TIA), and cerebral infarction without residual deficits: Secondary | ICD-10-CM | POA: Insufficient documentation

## 2014-01-25 DIAGNOSIS — Z7982 Long term (current) use of aspirin: Secondary | ICD-10-CM | POA: Insufficient documentation

## 2014-01-25 DIAGNOSIS — Z940 Kidney transplant status: Secondary | ICD-10-CM | POA: Insufficient documentation

## 2014-01-25 DIAGNOSIS — R52 Pain, unspecified: Secondary | ICD-10-CM

## 2014-01-25 DIAGNOSIS — Z79899 Other long term (current) drug therapy: Secondary | ICD-10-CM | POA: Diagnosis not present

## 2014-01-25 DIAGNOSIS — N186 End stage renal disease: Secondary | ICD-10-CM | POA: Diagnosis not present

## 2014-01-25 DIAGNOSIS — Z8619 Personal history of other infectious and parasitic diseases: Secondary | ICD-10-CM | POA: Diagnosis not present

## 2014-01-25 DIAGNOSIS — K219 Gastro-esophageal reflux disease without esophagitis: Secondary | ICD-10-CM | POA: Diagnosis not present

## 2014-01-25 DIAGNOSIS — Z8679 Personal history of other diseases of the circulatory system: Secondary | ICD-10-CM | POA: Diagnosis not present

## 2014-01-25 DIAGNOSIS — D509 Iron deficiency anemia, unspecified: Secondary | ICD-10-CM | POA: Insufficient documentation

## 2014-01-25 DIAGNOSIS — Z7952 Long term (current) use of systemic steroids: Secondary | ICD-10-CM | POA: Diagnosis not present

## 2014-01-25 DIAGNOSIS — Z8659 Personal history of other mental and behavioral disorders: Secondary | ICD-10-CM | POA: Insufficient documentation

## 2014-01-25 LAB — COMPREHENSIVE METABOLIC PANEL
ALT: 19 U/L (ref 0–35)
AST: 30 U/L (ref 0–37)
Albumin: 4.5 g/dL (ref 3.5–5.2)
Alkaline Phosphatase: 94 U/L (ref 39–117)
Anion gap: 10 (ref 5–15)
BUN: 30 mg/dL — ABNORMAL HIGH (ref 6–23)
CO2: 24 mmol/L (ref 19–32)
Calcium: 10.2 mg/dL (ref 8.4–10.5)
Chloride: 106 mEq/L (ref 96–112)
Creatinine, Ser: 2.4 mg/dL — ABNORMAL HIGH (ref 0.50–1.10)
GFR calc Af Amer: 27 mL/min — ABNORMAL LOW (ref >90)
GFR calc non Af Amer: 23 mL/min — ABNORMAL LOW (ref >90)
Glucose, Bld: 101 mg/dL — ABNORMAL HIGH (ref 70–99)
Potassium: 3.7 mmol/L (ref 3.5–5.1)
Sodium: 140 mmol/L (ref 135–145)
Total Bilirubin: 0.9 mg/dL (ref 0.3–1.2)
Total Protein: 7.1 g/dL (ref 6.0–8.3)

## 2014-01-25 LAB — CBC WITH DIFFERENTIAL/PLATELET
Basophils Absolute: 0 10*3/uL (ref 0.0–0.1)
Basophils Relative: 0 % (ref 0–1)
Eosinophils Absolute: 0.1 10*3/uL (ref 0.0–0.7)
Eosinophils Relative: 1 % (ref 0–5)
HCT: 31.8 % — ABNORMAL LOW (ref 36.0–46.0)
Hemoglobin: 10.6 g/dL — ABNORMAL LOW (ref 12.0–15.0)
Lymphocytes Relative: 10 % — ABNORMAL LOW (ref 12–46)
Lymphs Abs: 0.6 10*3/uL — ABNORMAL LOW (ref 0.7–4.0)
MCH: 30.7 pg (ref 26.0–34.0)
MCHC: 33.3 g/dL (ref 30.0–36.0)
MCV: 92.2 fL (ref 78.0–100.0)
Monocytes Absolute: 0.5 10*3/uL (ref 0.1–1.0)
Monocytes Relative: 9 % (ref 3–12)
Neutro Abs: 4.7 10*3/uL (ref 1.7–7.7)
Neutrophils Relative %: 80 % — ABNORMAL HIGH (ref 43–77)
Platelets: 200 10*3/uL (ref 150–400)
RBC: 3.45 MIL/uL — ABNORMAL LOW (ref 3.87–5.11)
RDW: 19.9 % — ABNORMAL HIGH (ref 11.5–15.5)
WBC: 5.8 10*3/uL (ref 4.0–10.5)

## 2014-01-25 LAB — URINALYSIS, ROUTINE W REFLEX MICROSCOPIC
Bilirubin Urine: NEGATIVE
Glucose, UA: NEGATIVE mg/dL
Ketones, ur: NEGATIVE mg/dL
Leukocytes, UA: NEGATIVE
Nitrite: NEGATIVE
Protein, ur: 100 mg/dL — AB
Specific Gravity, Urine: 1.017 (ref 1.005–1.030)
Urobilinogen, UA: 0.2 mg/dL (ref 0.0–1.0)
pH: 6 (ref 5.0–8.0)

## 2014-01-25 LAB — URINE MICROSCOPIC-ADD ON

## 2014-01-25 MED ORDER — SODIUM CHLORIDE 0.9 % IV BOLUS (SEPSIS)
1000.0000 mL | Freq: Once | INTRAVENOUS | Status: AC
Start: 2014-01-25 — End: 2014-01-25
  Administered 2014-01-25: 1000 mL via INTRAVENOUS

## 2014-01-25 MED ORDER — HYDROMORPHONE HCL 1 MG/ML IJ SOLN
INTRAMUSCULAR | Status: AC
  Filled 2014-01-25: qty 1

## 2014-01-25 MED ORDER — HYDROMORPHONE HCL 1 MG/ML IJ SOLN
1.0000 mg | Freq: Once | INTRAMUSCULAR | Status: AC
  Administered 2014-01-25: 1 mg via INTRAVENOUS
  Filled 2014-01-25: qty 1

## 2014-01-25 MED ORDER — HYDROMORPHONE HCL 1 MG/ML IJ SOLN
1.0000 mg | Freq: Once | INTRAMUSCULAR | Status: AC
  Administered 2014-01-25: 1 mg via INTRAVENOUS

## 2014-01-25 MED ORDER — METOCLOPRAMIDE HCL 5 MG/ML IJ SOLN
5.0000 mg | Freq: Once | INTRAMUSCULAR | Status: AC
  Administered 2014-01-25: 5 mg via INTRAVENOUS
  Filled 2014-01-25: qty 2

## 2014-01-25 MED ORDER — SODIUM CHLORIDE 0.9 % IV BOLUS (SEPSIS)
1000.0000 mL | Freq: Once | INTRAVENOUS | Status: AC
  Administered 2014-01-25: 1000 mL via INTRAVENOUS

## 2014-01-25 MED ORDER — FENTANYL CITRATE 0.05 MG/ML IJ SOLN
50.0000 ug | Freq: Once | INTRAMUSCULAR | Status: AC
  Administered 2014-01-25: 50 ug via INTRAVENOUS
  Filled 2014-01-25: qty 2

## 2014-01-25 MED ORDER — METOCLOPRAMIDE HCL 5 MG/ML IJ SOLN
10.0000 mg | Freq: Once | INTRAMUSCULAR | Status: AC
  Administered 2014-01-25: 10 mg via INTRAVENOUS
  Filled 2014-01-25: qty 2

## 2014-01-25 MED ORDER — DICYCLOMINE HCL 20 MG PO TABS: 20.0000 mg | ORAL_TABLET | Freq: Three times a day (TID) | ORAL | Status: DC

## 2014-01-25 MED ORDER — HYDROMORPHONE HCL 1 MG/ML IJ SOLN
1.0000 mg | Freq: Once | INTRAMUSCULAR | Status: AC
Start: 2014-01-25 — End: 2014-01-25
  Administered 2014-01-25: 1 mg via INTRAVENOUS
  Filled 2014-01-25: qty 1

## 2014-01-25 NOTE — ED Notes (Signed)
Report to gabe, rn.  Pt care transferred

## 2014-01-25 NOTE — Discharge Instructions (Signed)
Take Reglan or Zofran as needed for nausea. You can also use the Bentyl as needed for discomfort. Return if unable to hold down fluids without vomiting or if concern for any reason. Call Dr. Ardis Hughs to arrange for the next available office appointment. You can also call any of the numbers on the resource guide to get a primary care physician.  Emergency Department Resource Guide 1) Find a Doctor and Pay Out of Pocket Although you won't have to find out who is covered by your insurance plan, it is a good idea to ask around and get recommendations. You will then need to call the office and see if the doctor you have chosen will accept you as a new patient and what types of options they offer for patients who are self-pay. Some doctors offer discounts or will set up payment plans for their patients who do not have insurance, but you will need to ask so you aren't surprised when you get to your appointment.  2) Contact Your Local Health Department Not all health departments have doctors that can see patients for sick visits, but many do, so it is worth a call to see if yours does. If you don't know where your local health department is, you can check in your phone book. The CDC also has a tool to help you locate your state's health department, and many state websites also have listings of all of their local health departments.  3) Find a [facility] If your illness is not likely to be very severe or complicated, you may want to try a walk in clinic. These are popping up all over the country in pharmacies, drugstores, and shopping centers. They're usually staffed by nurse practitioners or physician assistants that have been trained to treat common illnesses and complaints. They're usually fairly quick and inexpensive. However, if you have serious medical issues or chronic medical problems, these are probably not your best option.  No Primary Care Doctor: - Call Health Connect at  [phone] - they can help  you locate a primary care doctor that  accepts your insurance, provides certain services, etc. - Physician Referral Service- [phone]  Chronic Pain Problems: Organization         Address  Phone   Notes  [facility]  [phone] Patients need to be referred by their primary care doctor.   Medication Assistance: Organization         Address  Phone   Notes  [facility] Medication [facility] [address] [place], [place] [address] [phone] --Must be a resident of [facility] -- Must have NO insurance coverage whatsoever (no Medicaid/ Medicare, etc.) -- The pt. MUST have a primary care doctor that directs their care regularly and follows them in the community   MedAssist  [phone]   [employer]  [phone]    Agencies that provide inexpensive medical care: Organization         Address  Phone   Notes  [place]  [phone]   [facility]    [phone]   [facility] [place], [place] [address] [phone]   [place] [address] [place] [phone]   Planned Parenthood    [phone]   [facility]    [phone]   [place] and [place] [address] [place], [employer] Phone:  [phone], Fax:  [phone] Hours of Operation:  9 am - 6 pm, M-F.  Also accepts Medicaid/Medicare and self-pay.  [facility] for [place], [address], [place] Phone: [phone], Fax: [phone]. Hours of Operation:  8:30 am - 5:30 pm, M-F.  Also accepts Medicaid and self-pay.  [facility] [place] [address] [place] Phone: [phone]   [place], [place], [place] [phone], Ext. [phone] Mondays & Thursdays: 7-9 AM.  First 15 patients are seen on a first come, first serve basis.    [place] Providers:  Organization         Address  Phone   Notes  [facility] [address], [place] [phone] Also accepts self-pay patients.  [facility] [id number] [place], [place]  [phone]   [place], [address], [place] [phone]   [facility] [address] [place] [phone]   Lucianne Lei [address], [place]   [phone] Only accepts [place] Access [place] patients after they have their name applied to their card.   Self-Pay (no insurance) in [facility]:  Organization         Address  Phone   Notes  Sickle Cell Patients, [facility] [place] [phone]   [facility] [place] [phone]   [facility] [place]  [place], [place], [place] [phone]   [facility]/Dr. Osei-Bonsu  [address], [place] or [address], [place] [phone] Phone number for both [place] and [place] locations is the same.  Urgent Medical and [facility] [address] [place] [phone]   [facility] [address] [place] or [address] [phone]   [facility] [address], [place] [phone], phone; [phone], fax Sees patients 1st and 3rd Saturday of every month.  Must not qualify for public or private insurance (i.e. Medicaid, Medicare, [place] Health Choice, Veterans' Benefits)  Household income should be no more than 200% of the poverty level The clinic cannot treat you if you are pregnant or think you are pregnant  Sexually transmitted diseases are not treated at the clinic.    Dental Care: Organization         Address  Phone  Notes  [facility] [place] [phone] Accepts children up to age 31 who are enrolled in [place] or [place]; pregnant women with a Medicaid card; and children who have applied for Medicaid or [place] Health Choice, but were declined, whose parents can pay a reduced fee at time of service.  [facility] Department of [facility]  [address], [place] [phone] Accepts children up to age 34 who are enrolled in [place] or [place]; pregnant women with a Medicaid card; and children who have applied for Medicaid or [place] Health Choice, but were declined, whose parents can pay a reduced fee at time of service.  [place] Adult [facility] Access PROGRAM  [place] [phone] Patients are seen by appointment only. Walk-ins  are not accepted. [place] will see patients 33 years of age and older. Monday - Tuesday (8am-5pm) Most Wednesdays (8:30-5pm) $30 per visit, cash only  [facility] Adult Dental Access PROGRAM  [address], [facility] [phone] Patients are seen by appointment only. Walk-ins are not accepted. [place] will see patients 17 years of age and older. One Wednesday Evening (Monthly: Volunteer Based).  $30 per visit, cash only  [place]  [phone] for adults; Children under age 66, call Graduate Pediatric Dentistry at [phone]. Children aged 52-14, please call [phone] to request a pediatric application.  Dental services are provided in all areas of dental care including fillings, crowns and bridges, complete and partial dentures, implants, gum treatment, root canals, and extractions. Preventive care is also provided. Treatment is provided to both adults and children. Patients are selected via a lottery and there is often a waiting list.   [facility] [address] [place]  [phone] [email or web address]   [facility] [address] [place], [place] [phone], Ext. [phone] Second and Fourth Thursday of each month, opens at 6:30 AM; Clinic ends at 9 AM.  Patients are seen on a first-come first-served basis, and a limited number are seen during each clinic.   [facility]  [address] Hillard Danker [place], [place] [phone]   Eligibility Requirements You must have lived in [place], [place], or [place] counties for at least the last three months.   You cannot be eligible for state or federal sponsored [employer], including [employer], [place], or [employer].   You generally cannot be eligible for healthcare insurance through your employer.    How to apply: Eligibility screenings are held every Tuesday and Wednesday afternoon from 1:00 pm until 4:00 pm. You do not need an appointment for the interview!  [facility] [address], [place], [place]   [place]  [place] Department  [place]  [phone]    Behavioral Health Resources in the Community: Intensive Outpatient Programs Organization         Address  Phone  Notes  [place]. [address] [place], [place] [phone]   [facility] Outpatient [address] [place], [place]   ADS: Alcohol & Drug Svcs [address] [place], [place]   [place] [address],  [place], [place] or [phone]   Substance Abuse Resources Organization         Address  Phone  Notes  Alcohol and Drug Services  [phone]   [place]  [phone]   [facility] [place]   Chinita Pester  [phone]   Residential & Outpatient Substance Abuse Program  [phone]   Psychological Services Organization         Address  Phone  Notes  [facility] [place]  [place]  [phone]   [place] [address], [place] [phone] or [phone]    Mobile Crisis Teams Organization         Address  Phone  Notes  [facility], Mobile Crisis Care Unit  [phone]   Assertive Psychotherapeutic Services  [address] [place], [place]   [facility] [address], [place] [phone]  Self-Help/Support Groups Organization         Address  Phone             Notes  [facility]ental [facility]oc[facility] of [place] - variety of support groups  [place] Call for more information  Narcotics Anonymous (NA), [facility] [address], [employer] [place]  2 meetings at this location   Special educational needs teacher         Address  Phone  Notes  ASAP Residential Treatment [place],    [place]  [phone]   [facility]  [address] [place] [id number], [place], [place]   [place], [place] [phone] Admissions: 8am-3pm M-F  Incentives Substance [place] [address]    [place], [place] [phone]   [facility] [address] [place], [place], [place]   [facility] [address],  [place], [place]   [facility] - Intensive Outpatient [address], [place], [place]   [facility] ([place].,  [place], [place] [phone] or [phone]   [facility] (RTS) [address], [employer], [place] Accepts Medicaid  [facility] [place] [address],  [place] [phone] Substance Abuse/Addiction Treatment   [facility] Organization         Address  Phone  Notes  [facility]  [phone]   Domenic Schwab, PhD [address] [place], [place]   [phone] or  [phone]   [place], [place] [phone]   [facility] [address] [place], [place] [phone] Insurance/Medicaid/sponsorship through [facility] and Families [address] [place]                                    [place], [place] [phone] [place] [address], [place] [phone]    Dr. Adele Schilder  [phone]   [facility] Dept. 1) [address] [place] 2) [place] 3)  [place] [address], [place] [phone]  [phone]   [place] [phone] or [phone] (After Hours)

## 2014-01-25 NOTE — ED Notes (Signed)
~  [id number]: woke up this morning with n/v. Here yesterday with similar symptoms; dx. with Gasroparesis

## 2014-01-25 NOTE — ED Notes (Signed)
MD at bedside. 

## 2014-01-25 NOTE — ED Notes (Signed)
Pt asking to speak to MD, would like to go home.

## 2014-01-25 NOTE — ED Provider Notes (Signed)
CSN: [id number]     Arrival date & time 01/25/14  1036 History   First MD Initiated Contact with Patient 01/25/14 1119     Chief Complaint  Patient presents with  . Abdominal Pain  . Nausea  . Emesis     (Consider location/radiation/quality/duration/timing/severity/associated sxs/prior Treatment) HPI Complains of upper abdominal pain crampy in nature typical of pain that she's had from gastroparesis in the past. She's vomited multiple times pain and vomiting became worse this morning. Pain is crampy constant. She's vomited multiple times, perhaps 20 with dry heaves. No fever. Last bowel movement this morning, normal. No other associated symptoms. No treatment prior to coming here. Past Medical History  Diagnosis Date  . Gastroparesis   . Gout   . Hypotension   . Herpes genitalia 1994  . E coli bacteremia 06/18/2011  . Bacteremia due to Gram-negative bacteria 05/23/2011  . History of blood transfusion     "more than a few times" (12/11/2011)  . History of stomach ulcers   . Stroke      left basal ganglia lacunar infarct; Right frontal lobe lacunar infarct.  . Stroke ~ 1999; 2001    "briefly lost my vision; lost my right eye" (12/11/2011)  . Chronic lower back pain   . Depression   . Osteopenia   . Headache(784.0)     "not often anymore" (12/11/2011)  . Complication of anesthesia     "woke up during OR; I have an extremely high tolerance" (12/11/2011)  . Dysrhythmia     "tachycardia" (12/11/2011)  . ESRD (end stage renal disease) 06/12/2011  . Iron deficiency anemia   . Seizures 1994    "post transplant; only have had that one" (12/11/2011)  . GERD (gastroesophageal reflux disease)    Past Surgical History  Procedure Laterality Date  . Kidney transplant  1994; 1999; 2005    "right"  . Appendectomy  ~ 2004  . 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 Total nephrectomy  1988?; 1994; 2005  . Multiple tooth  extractions    . Enucleation  2001    "right"  . Esophagogastroduodenoscopy (egd) with propofol N/A 04/21/2012    Procedure: ESOPHAGOGASTRODUODENOSCOPY (EGD) WITH PROPOFOL;  Surgeon: Milus Banister, MD;  Location: [facility];  Service: Endoscopy;  Laterality: N/A;   Family History  Problem Relation Age of Onset  . Hypertension Maternal Grandmother    History  Substance Use Topics  . Smoking status: Current Some Day Smoker -- 0.50 packs/day for 28 years    Types: Cigarettes  . Smokeless tobacco: Never Used     Comment: had quit 03/05/2011  . Alcohol Use: Yes     Comment: uses daily   OB History    No data available     Review of Systems  Constitutional: Negative.   HENT: Negative.   Respiratory: Negative.   Cardiovascular: Negative.   Gastrointestinal: Positive for nausea, vomiting and abdominal pain.  Genitourinary:       Amenorrheic since the 1980s  Musculoskeletal: Negative.   Skin: Negative.   Neurological: Negative.   Psychiatric/Behavioral: Negative.   All other systems reviewed and are negative.     Allergies  Levofloxacin  Home Medications   Prior to Admission medications   Medication Sig Start Date End Date Taking? Authorizing Provider  aspirin EC 81 MG tablet Take 81 mg by mouth daily.    Historical Provider, MD  azaTHIOprine (IMURAN) 50 MG tablet Take 100 mg by mouth daily.     Historical Provider, MD  colchicine 0.6 MG tablet Take 0.6 mg by mouth daily as needed (For gout.).    Historical Provider, MD  Darbepoetin Alfa-Albumin (ARANESP IJ) Inject as directed as directed.    Historical Provider, MD  dicyclomine (BENTYL) 20 MG tablet Take 20 mg by mouth 2 (two) times daily as needed (for abdominal cramping).    Historical Provider, MD  furosemide (LASIX) 40 MG tablet Take 40 mg by mouth every evening.  06/22/11   [name], MD  HYDROcodone-acetaminophen [facility]) 10-325 MG per tablet Take 1 tablet by mouth every 6 (six) hours as needed for  moderate pain.  01/09/14   Historical Provider, MD  metoCLOPramide (REGLAN) 5 MG tablet TAKE 1 TABLET (5 MG TOTAL) BY MOUTH AT BEDTIME AND MAY REPEAT DOSE ONE TIME IF NEEDED. 09/13/13   Milus Banister, MD  Multiple Vitamin (MULTIVITAMIN) tablet Take 1 tablet by mouth daily.    Historical Provider, MD  nystatin (MYCOSTATIN) 100000 UNIT/ML suspension Take 5 mLs (500,000 Units total) by mouth 4 (four) times daily. 09/13/13   Milus Banister, MD  omeprazole (PRILOSEC) 40 MG capsule Take 40 mg by mouth 2 (two) times daily.     Historical Provider, MD  ondansetron (ZOFRAN-ODT) 4 MG disintegrating tablet Take 1 tablet (4 mg total) by mouth every 6 (six) hours. 05/18/13   Amy S [place], PA-C  oxyCODONE (ROXICODONE) 5 MG immediate release tablet Take 1 tab daily as needed for pain. 05/18/13   Amy S [place], PA-C  oxyCODONE-acetaminophen (PERCOCET) 7.5-325 MG per tablet Take 1 tablet by mouth every 4 (four) hours as needed for pain.    Historical Provider, MD  predniSONE (DELTASONE) 5 MG tablet Take 5 mg by mouth daily. 06/22/11   [name], MD  promethazine (PHENERGAN) 25 MG suppository Place 1 suppository (25 mg total) rectally every 6 (six) hours as needed for nausea or vomiting. 05/18/13   Amy S [place], PA-C  promethazine (PHENERGAN) 25 MG tablet Take 1 tablet (25 mg total) by mouth every 6 (six) hours as needed for nausea or vomiting. 05/07/13   Margarita Mail, PA-C  tacrolimus (PROGRAF) 1 MG capsule Take 3 mg by mouth 2 (two) times daily.  06/22/11   [name], MD   BP 113/59 mmHg  Pulse 65  Temp(Src) 98.5 F (36.9 C) (Oral)  Resp 16  Ht 4\' 11"  (1.499 m)  Wt 112 lb (50.803 kg)  BMI 22.61 kg/m2  SpO2 95% Physical Exam  Constitutional: She is oriented to person, place, and time. She appears well-developed and well-nourished. She appears distressed.  Appears uncomfortable  HENT:  Head: Normocephalic and atraumatic.  Eyes: Conjunctivae are normal.  Neck: Neck supple. No tracheal  deviation present. No thyromegaly present.  Cardiovascular: Normal rate and regular rhythm.   No murmur heard. Pulmonary/Chest: Effort normal and breath sounds normal.  Abdominal: She exhibits no distension.  Hypoactive bowel sounds  Musculoskeletal: Normal range of motion. She exhibits no edema or tenderness.  Neurological: She is alert and oriented to person, place, and time. Coordination normal.  Skin: Skin is warm and dry. No rash noted.  Psychiatric: She has a normal mood and affect.  Nursing note and vitals reviewed.   ED Course  Procedures (including critical care time) Labs Review Labs Reviewed  CBC WITH DIFFERENTIAL  COMPREHENSIVE METABOLIC PANEL  URINALYSIS, ROUTINE W REFLEX MICROSCOPIC    Imaging Review  No results found.   EKG Interpretation None     4:30 PM patient feels much improved and ready to go home after treatment with intravenous fluids, antiemetics and opioids. She is not lightheaded on standing. She is able to drink without vomiting or nausea. X-rays viewed by me Results for orders placed or performed during the hospital encounter of 01/25/14  CBC with Differential  Result Value Ref Range   WBC 5.8 4.0 - 10.5 K/uL   RBC 3.45 (L) 3.87 - 5.11 MIL/uL   Hemoglobin 10.6 (L) 12.0 - 15.0 g/dL   HCT 31.8 (L) 36.0 - 46.0 %   MCV 92.2 78.0 - 100.0 fL   MCH 30.7 26.0 - 34.0 pg   MCHC 33.3 30.0 - 36.0 g/dL   RDW 19.9 (H) 11.5 - 15.5 %   Platelets 200 150 - 400 K/uL   Neutrophils Relative % 80 (H) 43 - 77 %   Neutro Abs 4.7 1.7 - 7.7 K/uL   Lymphocytes Relative 10 (L) 12 - 46 %   Lymphs Abs 0.6 (L) 0.7 - 4.0 K/uL   Monocytes Relative 9 3 - 12 %   Monocytes Absolute 0.5 0.1 - 1.0 K/uL   Eosinophils Relative 1 0 - 5 %   Eosinophils Absolute 0.1 0.0 - 0.7 K/uL   Basophils Relative 0 0 - 1 %   Basophils Absolute 0.0 0.0 - 0.1 K/uL  Comprehensive metabolic panel  Result Value Ref Range   Sodium 140 135 - 145 mmol/L   Potassium 3.7 3.5 - 5.1 mmol/L    Chloride 106 96 - 112 mEq/L   CO2 24 19 - 32 mmol/L   Glucose, Bld 101 (H) 70 - 99 mg/dL   BUN 30 (H) 6 - 23 mg/dL   Creatinine, Ser 2.40 (H) 0.50 - 1.10 mg/dL   Calcium 10.2 8.4 - 10.5 mg/dL   Total Protein 7.1 6.0 - 8.3 g/dL   Albumin 4.5 3.5 - 5.2 g/dL   AST 30 0 - 37 U/L   ALT 19 0 - 35 U/L   Alkaline Phosphatase 94 39 - 117 U/L   Total Bilirubin 0.9 0.3 - 1.2 mg/dL   GFR calc non Af Amer 23 (L) >90 mL/min   GFR calc Af Amer 27 (L) >90 mL/min   Anion gap 10 5 - 15  Urinalysis, Routine w reflex microscopic  Result Value Ref Range   Color, Urine YELLOW YELLOW   APPearance CLEAR CLEAR   Specific Gravity, Urine 1.017 1.005 - 1.030   pH 6.0 5.0 - 8.0   Glucose, UA NEGATIVE NEGATIVE mg/dL   Hgb urine dipstick TRACE (A) NEGATIVE   Bilirubin Urine NEGATIVE NEGATIVE   Ketones, ur NEGATIVE NEGATIVE mg/dL   Protein, ur 100 (A) NEGATIVE mg/dL   Urobilinogen, UA 0.2 0.0 - 1.0 mg/dL   Nitrite NEGATIVE NEGATIVE   Leukocytes, UA NEGATIVE NEGATIVE  Urine microscopic-add on  Result Value Ref Range   Squamous Epithelial / LPF FEW (A) RARE   WBC, UA 0-2 <3 WBC/hpf   RBC / HPF 0-2 <3 RBC/hpf   Bacteria, UA RARE RARE   Dg Abd Acute W/chest  01/25/2014   CLINICAL DATA:  Upper abdominal pain with nausea and vomiting for 2 days, initial encounter  EXAM: ACUTE ABDOMEN SERIES (ABDOMEN 2 VIEW & CHEST 1 VIEW)  COMPARISON:  10/14/2013  FINDINGS: Cardiac shadow is stable. A superior vena cava stent is again identified and unchanged. The lungs are well aerated bilaterally. Elevation the right hemidiaphragm  is again seen.  Postsurgical changes are noted within the abdomen and pelvis. No free air is seen. No abnormal mass or abnormal calcifications are noted. A nonobstructive bowel gas pattern is noted. No acute bony abnormality is seen.  IMPRESSION: No acute abnormality noted.   Electronically Signed   By: Inez Catalina M.D.   On: 01/25/2014 12:48    MDM  Asian has prescriptions for Zofran and Reglan at  home. She is requesting prescription for Bentyl which I will write, as Bentyl has helped her in the past. Symptoms felt secondary to gastroparesis. Renal insufficiency is chronic. Anemia is chronic Final diagnoses:  None   Dx #1 gastroparesis #2 nausea and vomiting #3chronic renal insufficiency #4 anemia     Orlie Dakin, MD 01/25/14 [phone]

## 2014-01-25 NOTE — ED Notes (Signed)
Pt noted to be tachycardic.  Dr Winfred Leeds notified, pt placed on heart monitor

## 2014-01-25 NOTE — ED Notes (Signed)
Pt transported to xray 

## 2014-02-09 ENCOUNTER — Encounter (HOSPITAL_COMMUNITY): Payer: Medicaid Other

## 2014-02-15 ENCOUNTER — Encounter (HOSPITAL_COMMUNITY)
Admission: RE | Admit: 2014-02-15 | Discharge: 2014-02-15 | Disposition: A | Discharge status: Home / Self Care | Payer: Medicaid Other | Source: Ambulatory Visit | Attending: Nephrology | Admitting: Nephrology

## 2014-02-15 DIAGNOSIS — N185 Chronic kidney disease, stage 5: Secondary | ICD-10-CM | POA: Insufficient documentation

## 2014-02-15 DIAGNOSIS — Z940 Kidney transplant status: Secondary | ICD-10-CM | POA: Diagnosis not present

## 2014-02-15 DIAGNOSIS — D631 Anemia in chronic kidney disease: Secondary | ICD-10-CM | POA: Insufficient documentation

## 2014-02-15 DIAGNOSIS — D638 Anemia in other chronic diseases classified elsewhere: Secondary | ICD-10-CM | POA: Diagnosis present

## 2014-02-15 LAB — RENAL FUNCTION PANEL
Albumin: 4.1 g/dL (ref 3.5–5.2)
Anion gap: 15 (ref 5–15)
BUN: 34 mg/dL — ABNORMAL HIGH (ref 6–23)
CO2: 26 mmol/L (ref 19–32)
Calcium: 10.3 mg/dL (ref 8.4–10.5)
Chloride: 102 mEq/L (ref 96–112)
Creatinine, Ser: 2.35 mg/dL — ABNORMAL HIGH (ref 0.50–1.10)
GFR calc Af Amer: 27 mL/min — ABNORMAL LOW (ref >90)
GFR calc non Af Amer: 24 mL/min — ABNORMAL LOW (ref >90)
Glucose, Bld: 84 mg/dL (ref 70–99)
Phosphorus: 3.8 mg/dL (ref 2.3–4.6)
Potassium: 4.2 mmol/L (ref 3.5–5.1)
Sodium: 143 mmol/L (ref 135–145)

## 2014-02-15 LAB — POCT HEMOGLOBIN-HEMACUE: Hemoglobin: 9.8 g/dL — ABNORMAL LOW (ref 12.0–15.0)

## 2014-02-15 MED ORDER — EPOETIN ALFA 20000 UNIT/ML IJ SOLN
40000.0000 [IU] | INTRAMUSCULAR | Status: DC
  Administered 2014-02-15: 40000 [IU] via SUBCUTANEOUS

## 2014-02-15 MED ORDER — EPOETIN ALFA 40000 UNIT/ML IJ SOLN
INTRAMUSCULAR | Status: AC
  Filled 2014-02-15: qty 1

## 2014-02-16 MED FILL — Epoetin Alfa Inj 40000 Unit/ML: INTRAMUSCULAR | Qty: 1 | Status: AC

## 2014-02-24 IMAGING — CT CT ABD-PELV W/O CM
2 of 4 series · 17 of 46 positions shown, 19 images · non-contrast
Comparison: 06/12/2011

CLINICAL DATA: Abdominal pain, nausea/vomiting, renal transplant

CT ABDOMEN AND PELVIS WITHOUT CONTRAST
TECHNIQUE: Multidetector CT imaging of the abdomen and pelvis was
performed following the standard protocol without intravenous
contrast.

[Series 2: abd/pelv w/o 5.0 b31f st · axial · non-contrast · 0.60mm/px · z∈[-398,-58]mm · 14 of 74 slices shown, 16 images]
[im 3/74  soft-tissue]
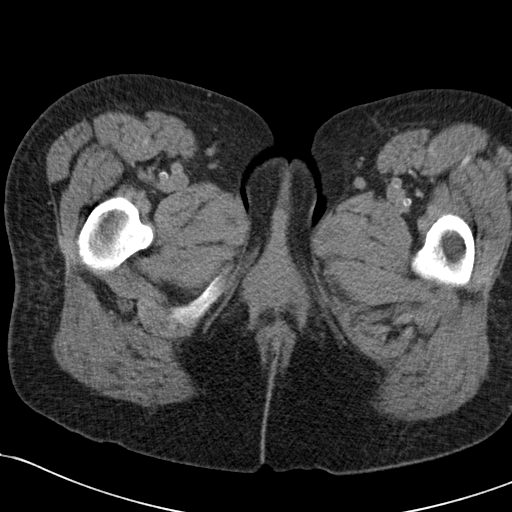
[im 3/74  bone]
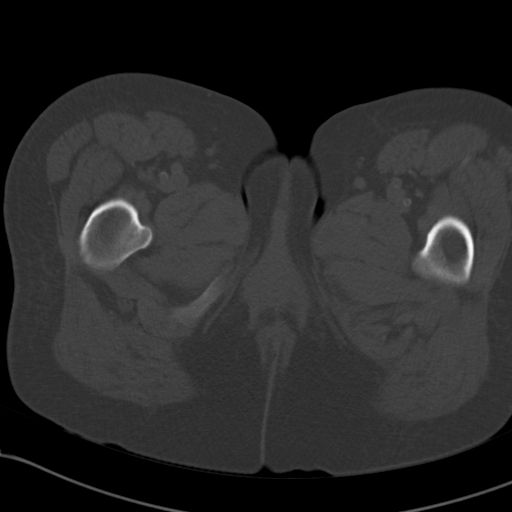
[im 9/74  soft-tissue]
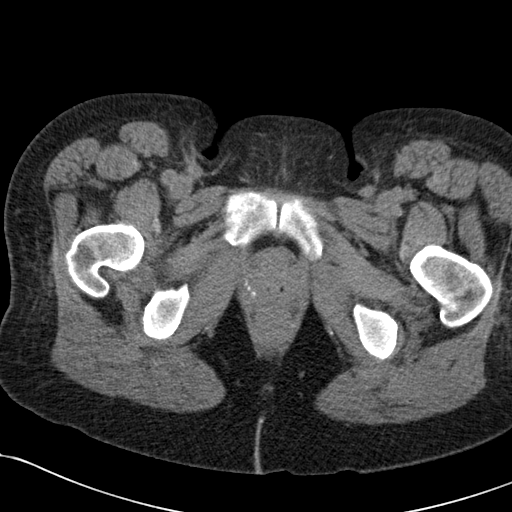
[im 15/74  soft-tissue]
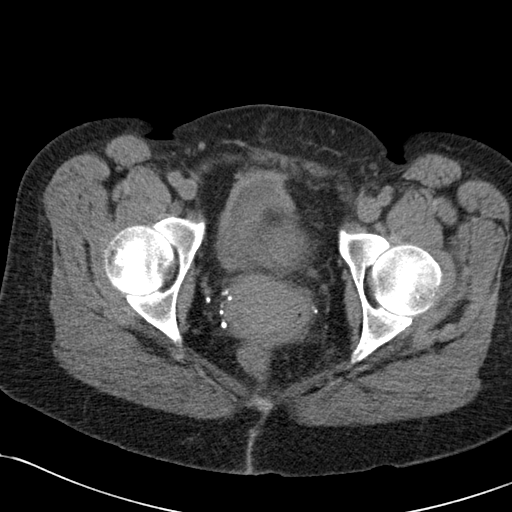
[im 20/74  soft-tissue]
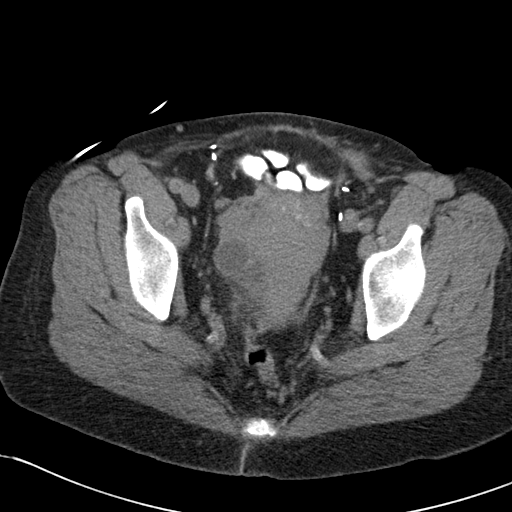
[im 26/74  soft-tissue]
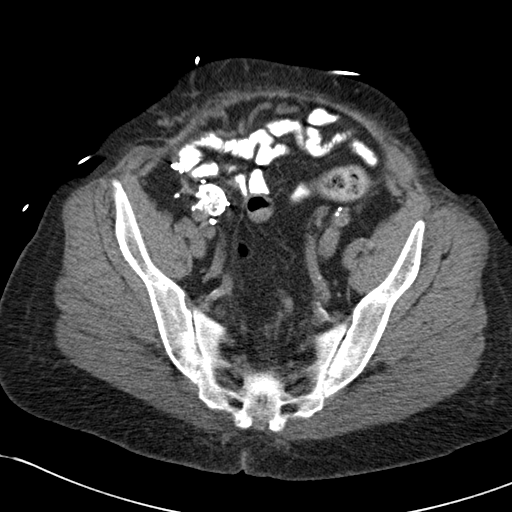
[im 29/74  soft-tissue]
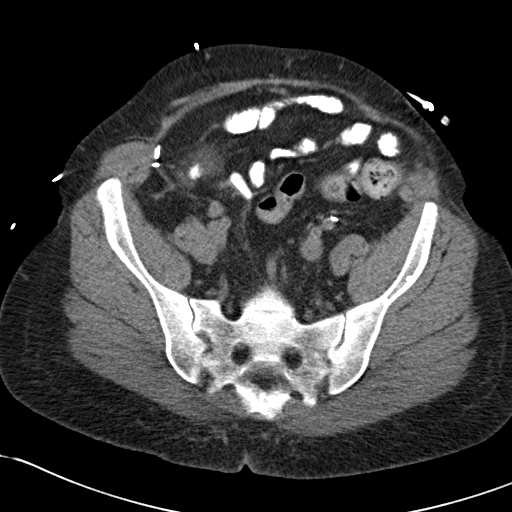
[im 34/74  soft-tissue]
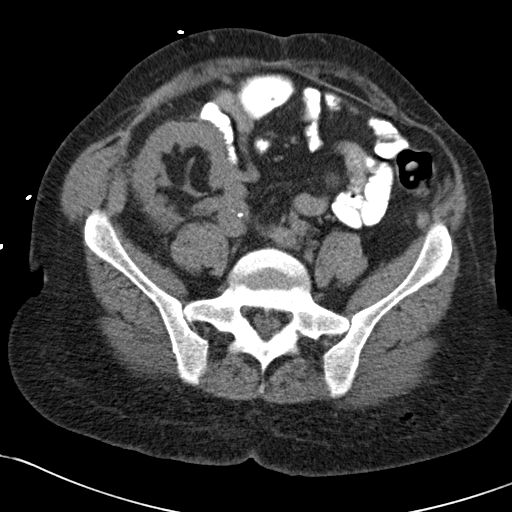
[im 40/74  soft-tissue]
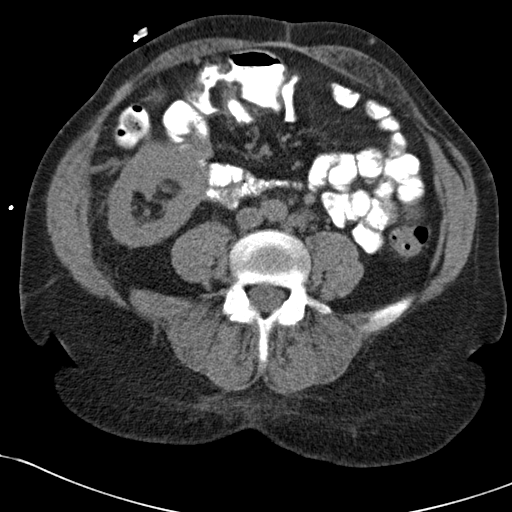
[im 45/74  soft-tissue]
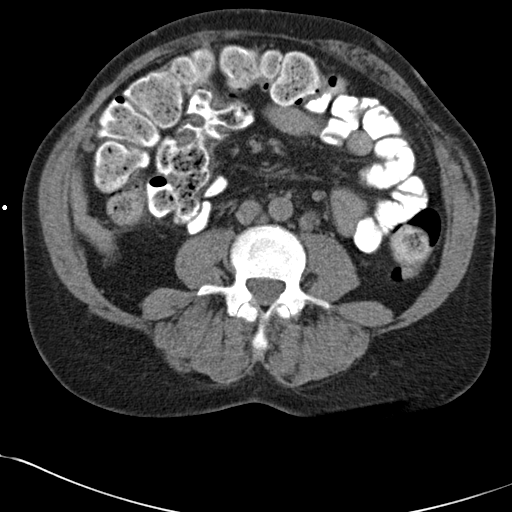
[im 45/74  bone]
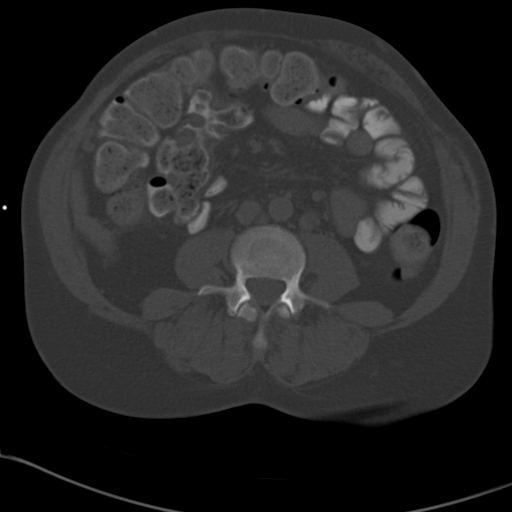
[im 48/74  soft-tissue]
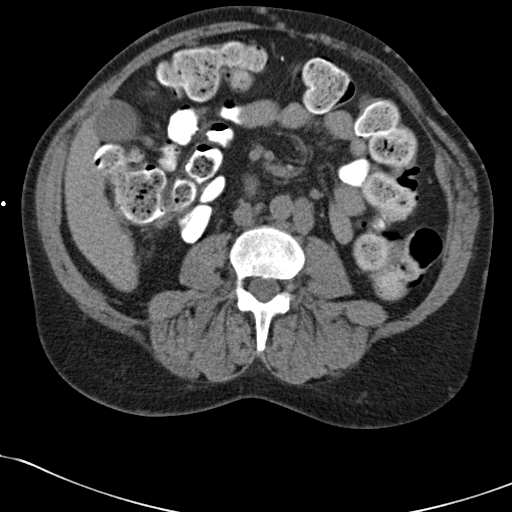
[im 54/74  soft-tissue]
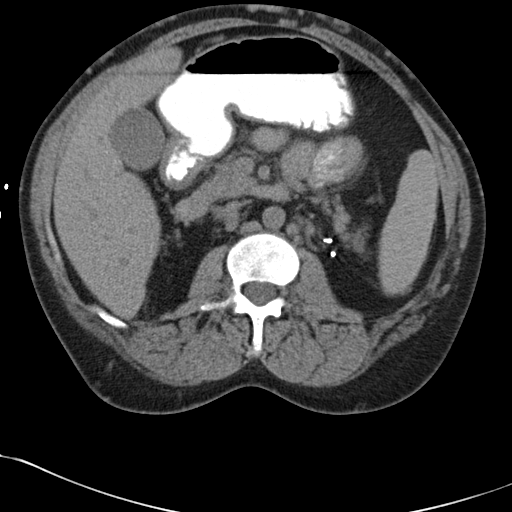
[im 59/74  soft-tissue]
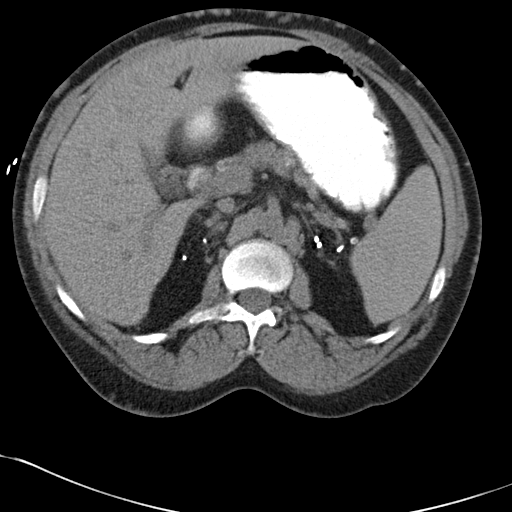
[im 65/74  soft-tissue]
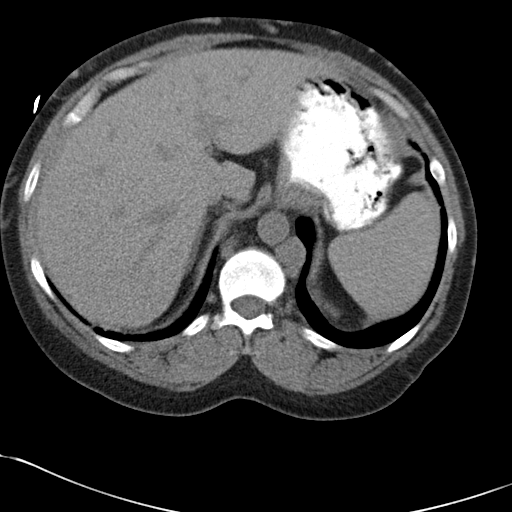
[im 71/74  soft-tissue]
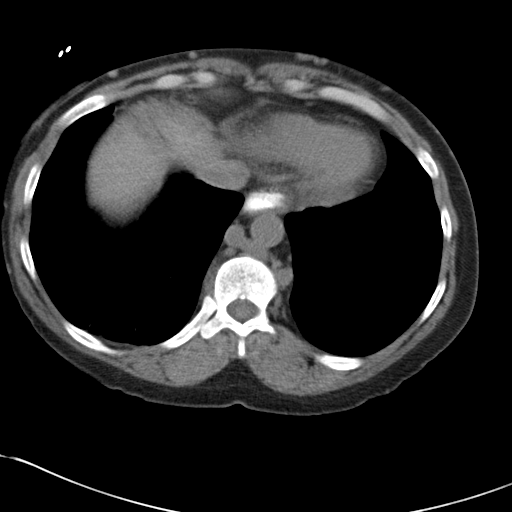

[Series 602: coronals · coronal · 0.72mm/px · 3 of 84 slices shown]
[im 28/84  soft-tissue]
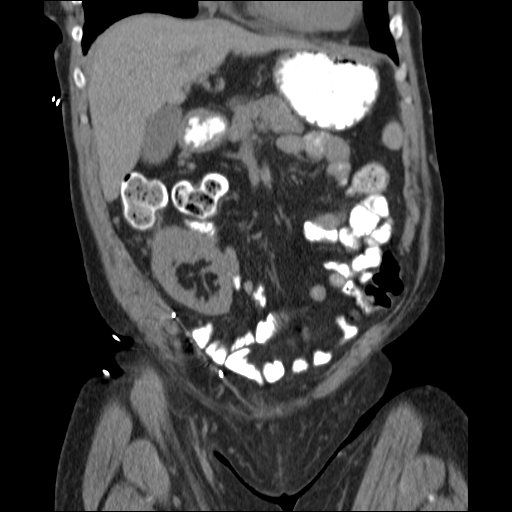
[im 37/84  soft-tissue]
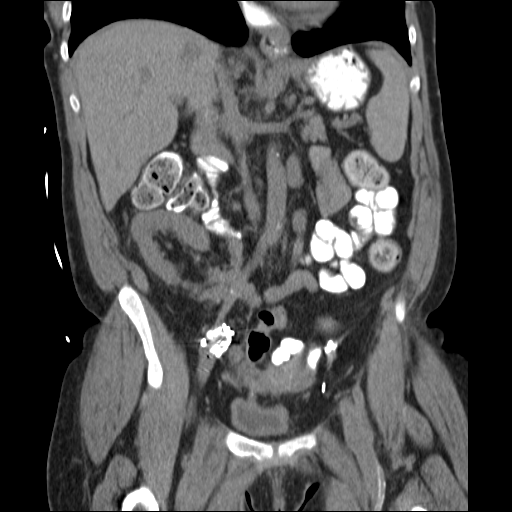
[im 47/84  soft-tissue]
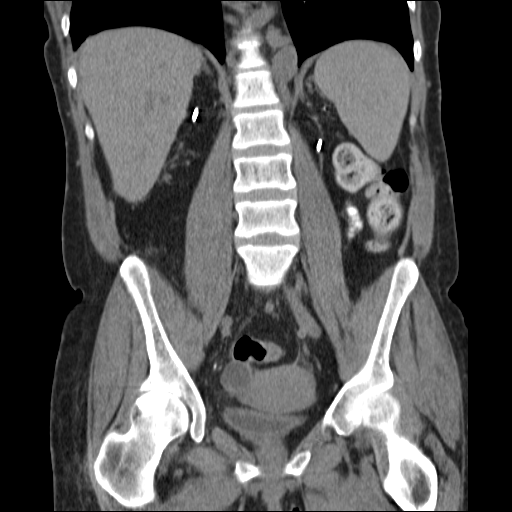

[17 of 46 positions shown; findings below may reference images not displayed]

FINDINGS: Minimal subpleural opacity / scarring in the right lower
lobe.

Oral contrast within the distal esophagus, suggesting
gastroesophageal reflux or esophageal dysmotility.  Small hiatal
hernia.

Unenhanced liver, pancreas, and adrenal glands are within normal
limits.

Calcified granuloma in the medial spleen.

Gallbladder is unremarkable.  No intrahepatic or extrahepatic
ductal dilatation.

Status post bilateral nephrectomy.

No evidence of bowel obstruction.

Stable right lower quadrant renal transplant.  No hydronephrosis.

Atherosclerotic calcifications of the abdominal aorta and branch
vessels.

Stable retrocrural/retroperitoneal collateral vessels.

No abdominopelvic ascites.

No suspicious lymphadenopathy.

Uterus is unremarkable.  2.0 cm right adnexal cyst.

Thick-walled bladder.

Suspected renal osteodystrophy.  Visualized osseous structures are
otherwise unremarkable.
IMPRESSION: No evidence of bowel obstruction.

Right lower quadrant renal transplant without hydronephrosis.

Thick-walled bladder, correlate for cystitis.

## 2014-02-24 IMAGING — CR DG ABDOMEN 1V
1 series · 1 of 1 positions shown · non-contrast
Comparison: None.

CLINICAL DATA: Abdominal pain, constipation, vomiting

ABDOMEN - 1 VIEW

[t abdomen supine]
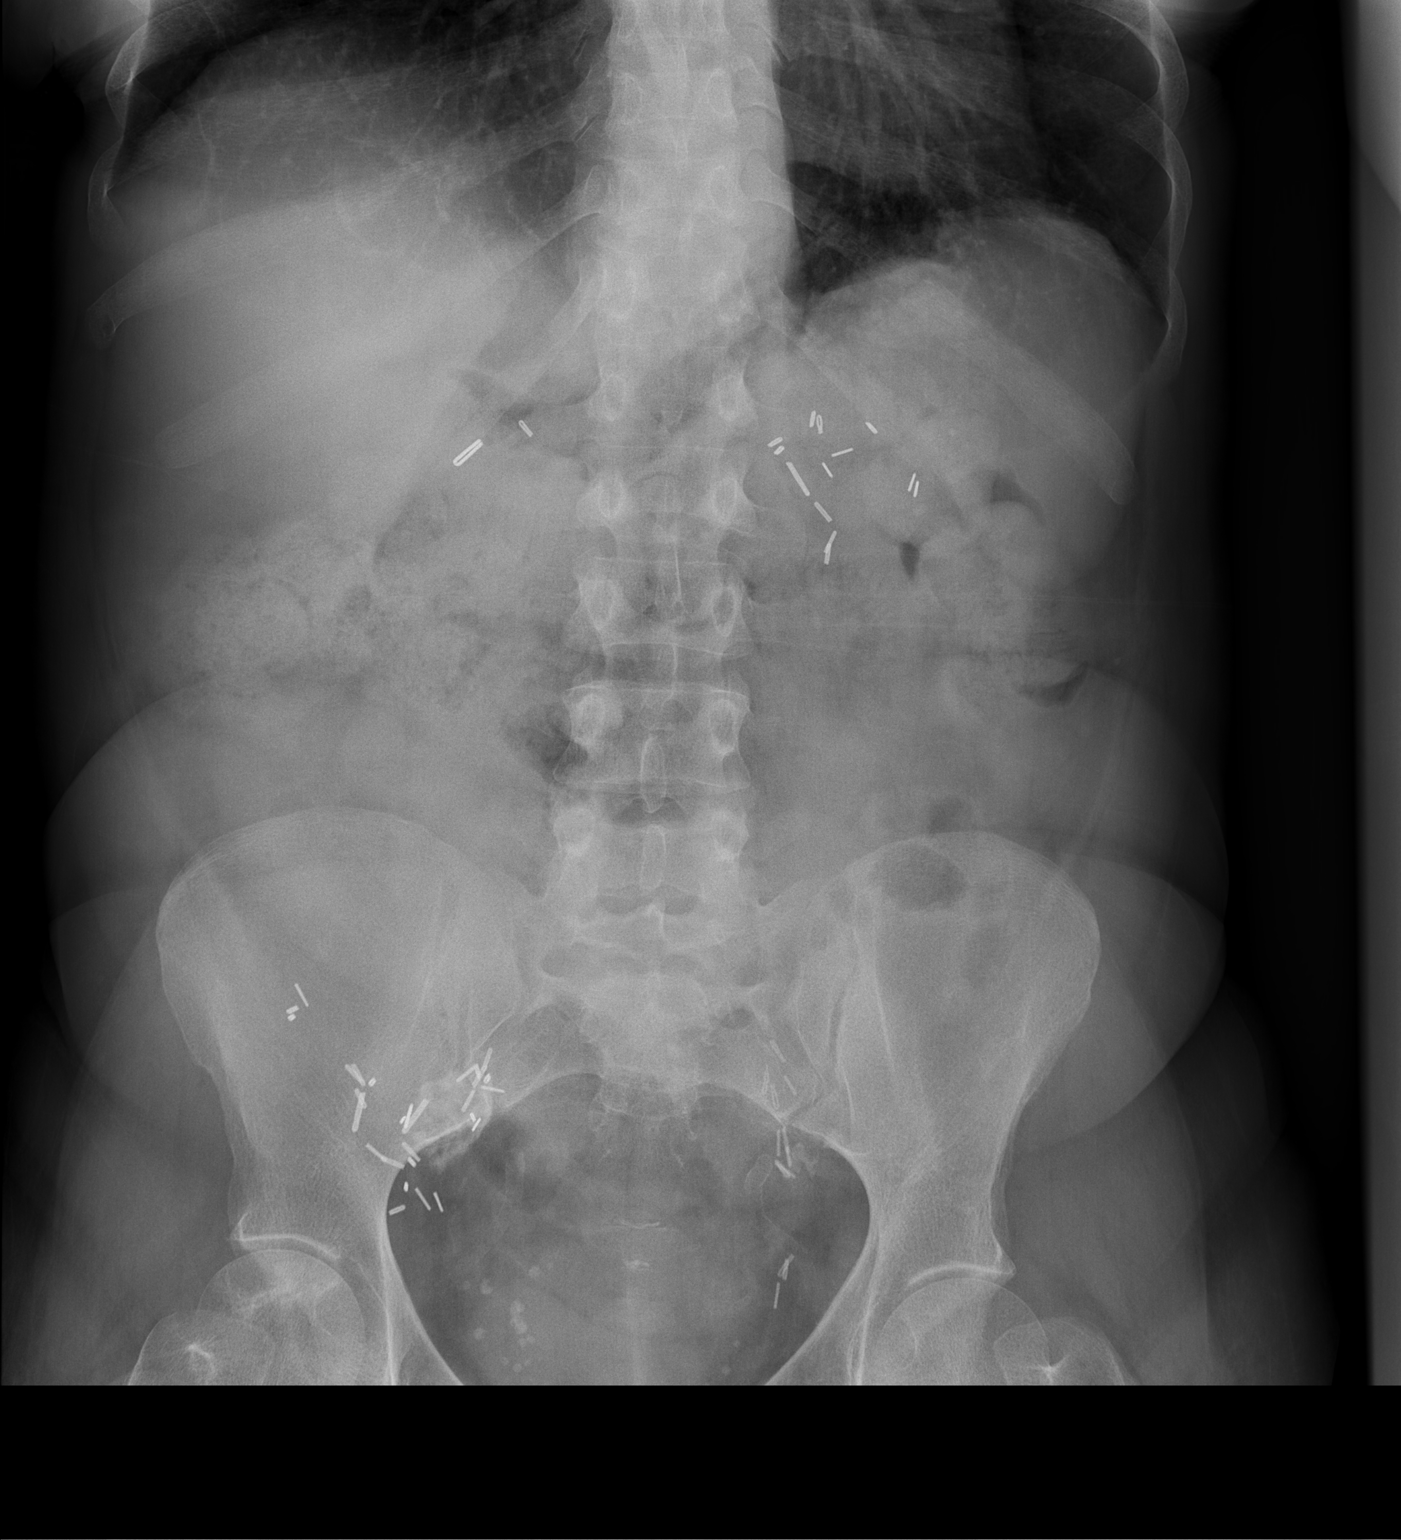

[1 of 1 positions shown; findings below may reference images not displayed]

FINDINGS: There is nonspecific nonobstructive bowel gas pattern.
Surgical clips are noted midline upper abdomen.  Surgical clips are
noted bilateral pelvis.  Multiple pelvic phleboliths.  Moderate
stool noted in transverse colon.
IMPRESSION: Nonspecific nonobstructive bowel gas pattern.  Postsurgical changes
upper abdomen and within pelvis.  Moderate stool and transverse
colon.

## 2014-02-26 IMAGING — CR DG ABDOMEN ACUTE W/ 1V CHEST
3 series · 3 of 3 positions shown · non-contrast
Comparison: 12/08/2011 and 05/23/2011

CLINICAL DATA: Abdominal pain, vomiting

ACUTE ABDOMEN SERIES (ABDOMEN 2 VIEW & CHEST 1 VIEW)

[w abdomen decub]
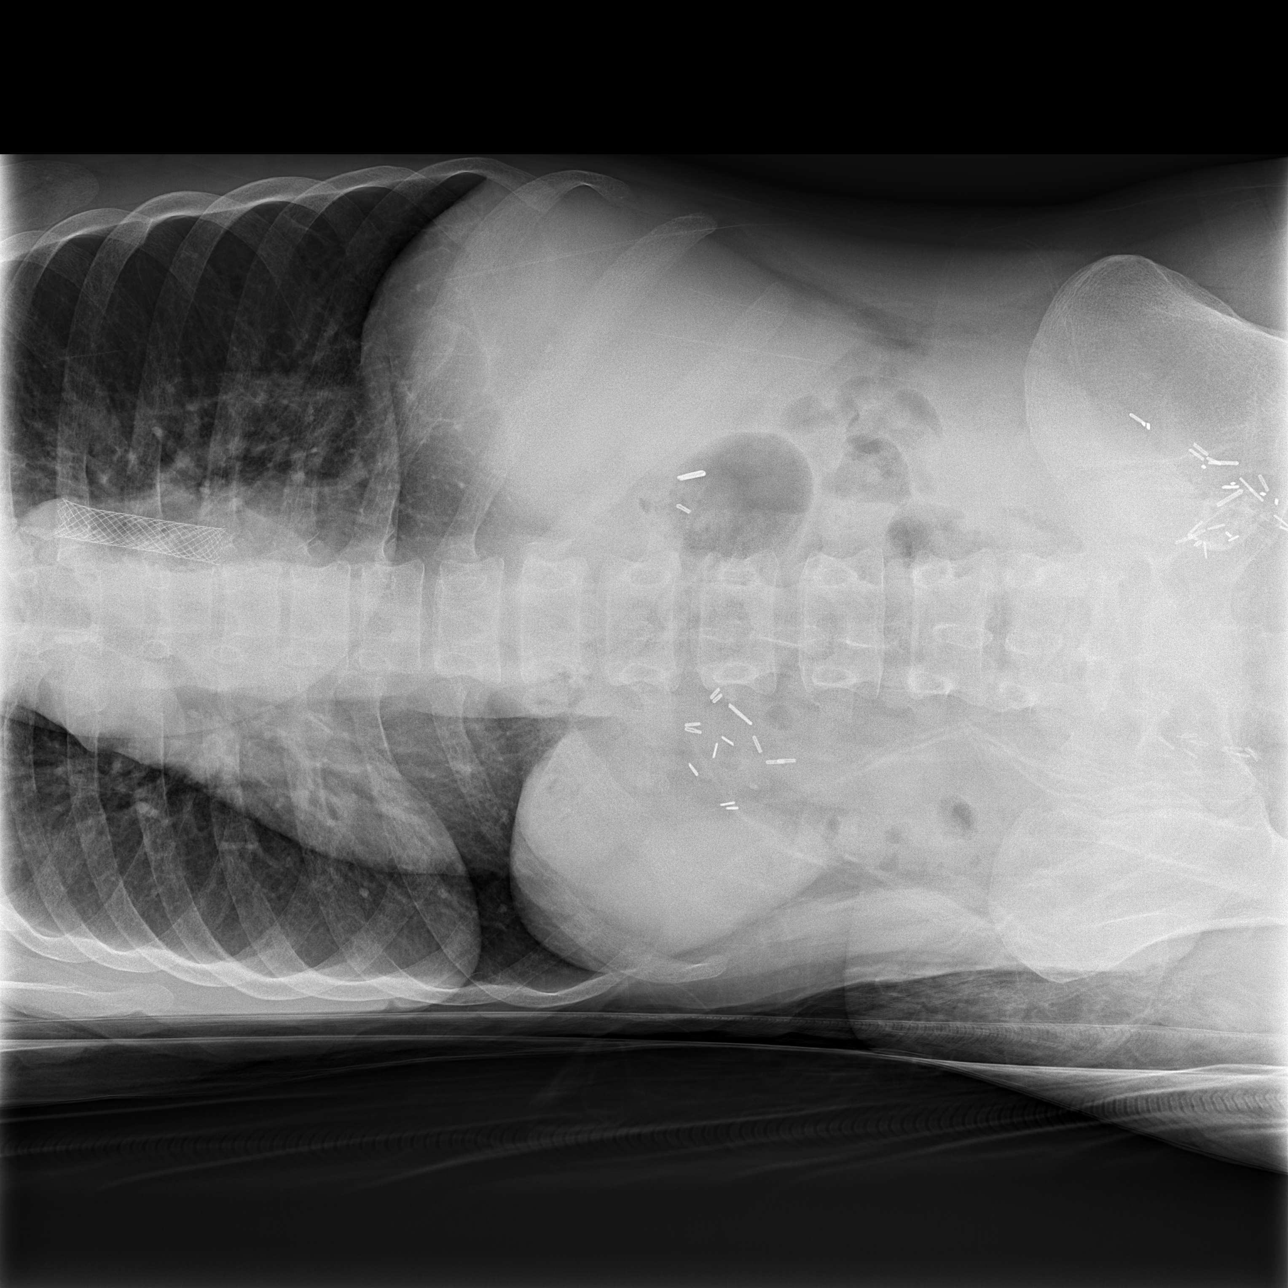

[x abdomen supine (1 of 2)]
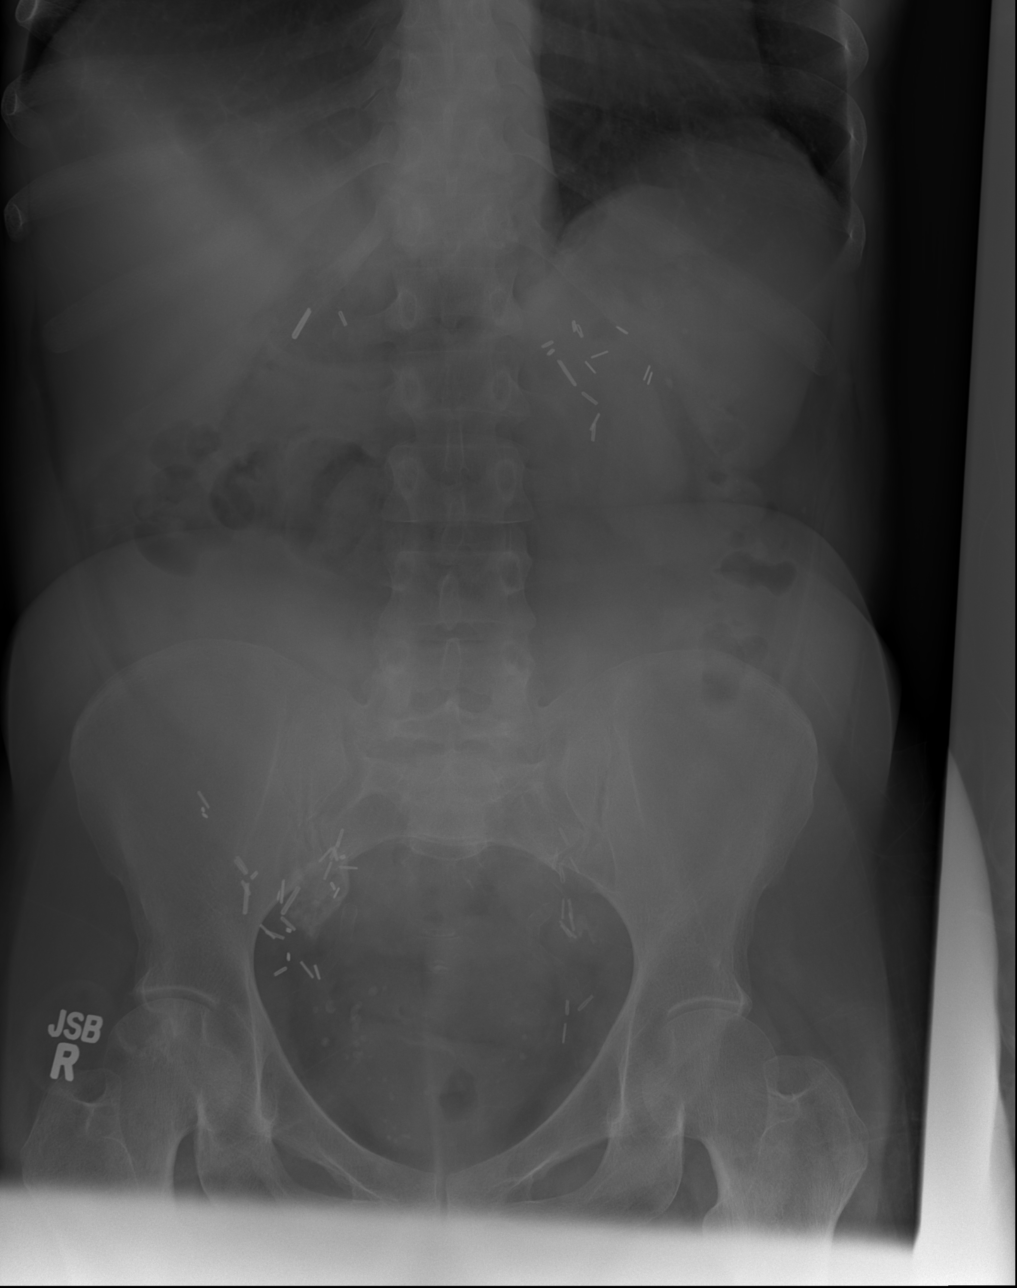

[x abdomen supine (2 of 2)]
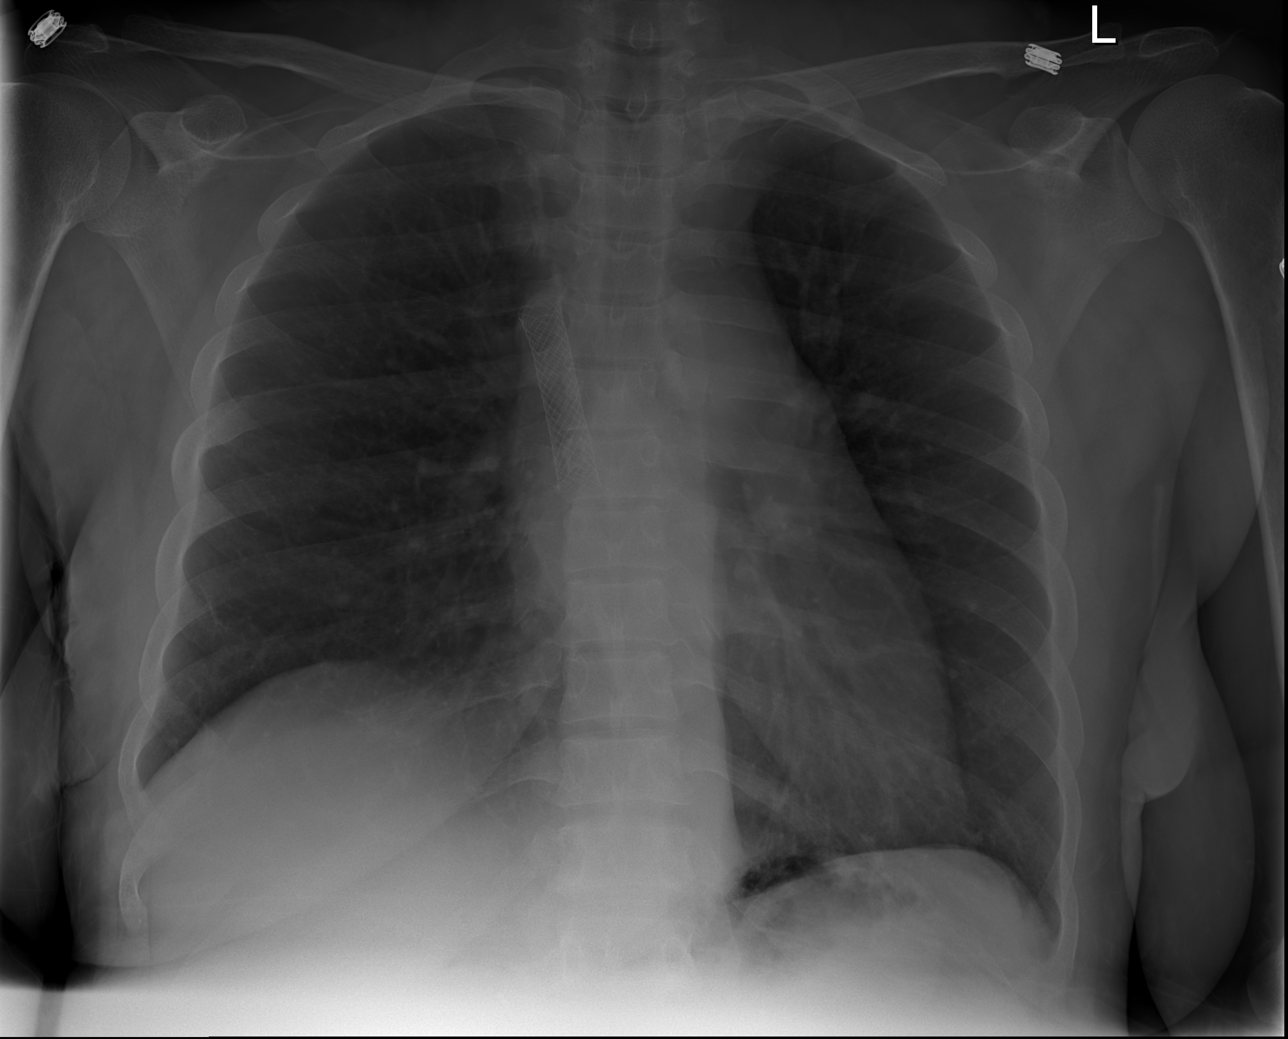

[3 of 3 positions shown; findings below may reference images not displayed]

FINDINGS: Cardiomediastinal silhouette is stable.  Stable stent in
SVC region.  No acute infiltrate or pleural effusion.

There is nonspecific nonobstructive bowel gas pattern.  Stable
postsurgical changes upper abdomen and within pelvis.  No free
abdominal air.
IMPRESSION: No acute disease.  No significant change.

## 2014-03-01 ENCOUNTER — Encounter: Payer: Self-pay | Admitting: Gastroenterology

## 2014-03-09 ENCOUNTER — Encounter (HOSPITAL_COMMUNITY)
Admission: RE | Admit: 2014-03-09 | Discharge: 2014-03-09 | Disposition: A | Discharge status: Home / Self Care | Payer: Medicaid Other | Source: Ambulatory Visit | Attending: Nephrology | Admitting: Nephrology

## 2014-03-09 DIAGNOSIS — D631 Anemia in chronic kidney disease: Secondary | ICD-10-CM | POA: Diagnosis not present

## 2014-03-09 DIAGNOSIS — D638 Anemia in other chronic diseases classified elsewhere: Secondary | ICD-10-CM | POA: Diagnosis present

## 2014-03-09 DIAGNOSIS — N185 Chronic kidney disease, stage 5: Secondary | ICD-10-CM | POA: Diagnosis not present

## 2014-03-09 DIAGNOSIS — Z940 Kidney transplant status: Secondary | ICD-10-CM | POA: Diagnosis not present

## 2014-03-09 LAB — POCT HEMOGLOBIN-HEMACUE: Hemoglobin: 10.4 g/dL — ABNORMAL LOW (ref 12.0–15.0)

## 2014-03-09 LAB — RENAL FUNCTION PANEL
Albumin: 4.1 g/dL (ref 3.5–5.2)
Anion gap: 5 (ref 5–15)
BUN: 35 mg/dL — ABNORMAL HIGH (ref 6–23)
CO2: 29 mmol/L (ref 19–32)
Calcium: 10 mg/dL (ref 8.4–10.5)
Chloride: 107 mmol/L (ref 96–112)
Creatinine, Ser: 2.2 mg/dL — ABNORMAL HIGH (ref 0.50–1.10)
GFR calc Af Amer: 30 mL/min — ABNORMAL LOW (ref >90)
GFR calc non Af Amer: 26 mL/min — ABNORMAL LOW (ref >90)
Glucose, Bld: 80 mg/dL (ref 70–99)
Phosphorus: 3.7 mg/dL (ref 2.3–4.6)
Potassium: 3.9 mmol/L (ref 3.5–5.1)
Sodium: 141 mmol/L (ref 135–145)

## 2014-03-09 LAB — IRON AND TIBC
Iron: 117 ug/dL (ref 42–145)
Saturation Ratios: 53 % (ref 20–55)
TIBC: 222 ug/dL — ABNORMAL LOW (ref 250–470)
UIBC: 105 ug/dL — ABNORMAL LOW (ref 125–400)

## 2014-03-09 LAB — FERRITIN: Ferritin: 1566 ng/mL — ABNORMAL HIGH (ref 10–291)

## 2014-03-09 MED ORDER — EPOETIN ALFA 40000 UNIT/ML IJ SOLN
INTRAMUSCULAR | Status: AC
  Filled 2014-03-09: qty 1

## 2014-03-09 MED ORDER — EPOETIN ALFA 20000 UNIT/ML IJ SOLN: 40000.0000 [IU] | INTRAMUSCULAR | Status: DC

## 2014-04-03 IMAGING — CR DG CHEST 2V
2 series · 2 of 2 positions shown · non-contrast
Comparison: 12/10/2011

CLINICAL DATA: Generalized weakness and fatigue.  Nausea.

CHEST - 2 VIEW

[w chest pa]
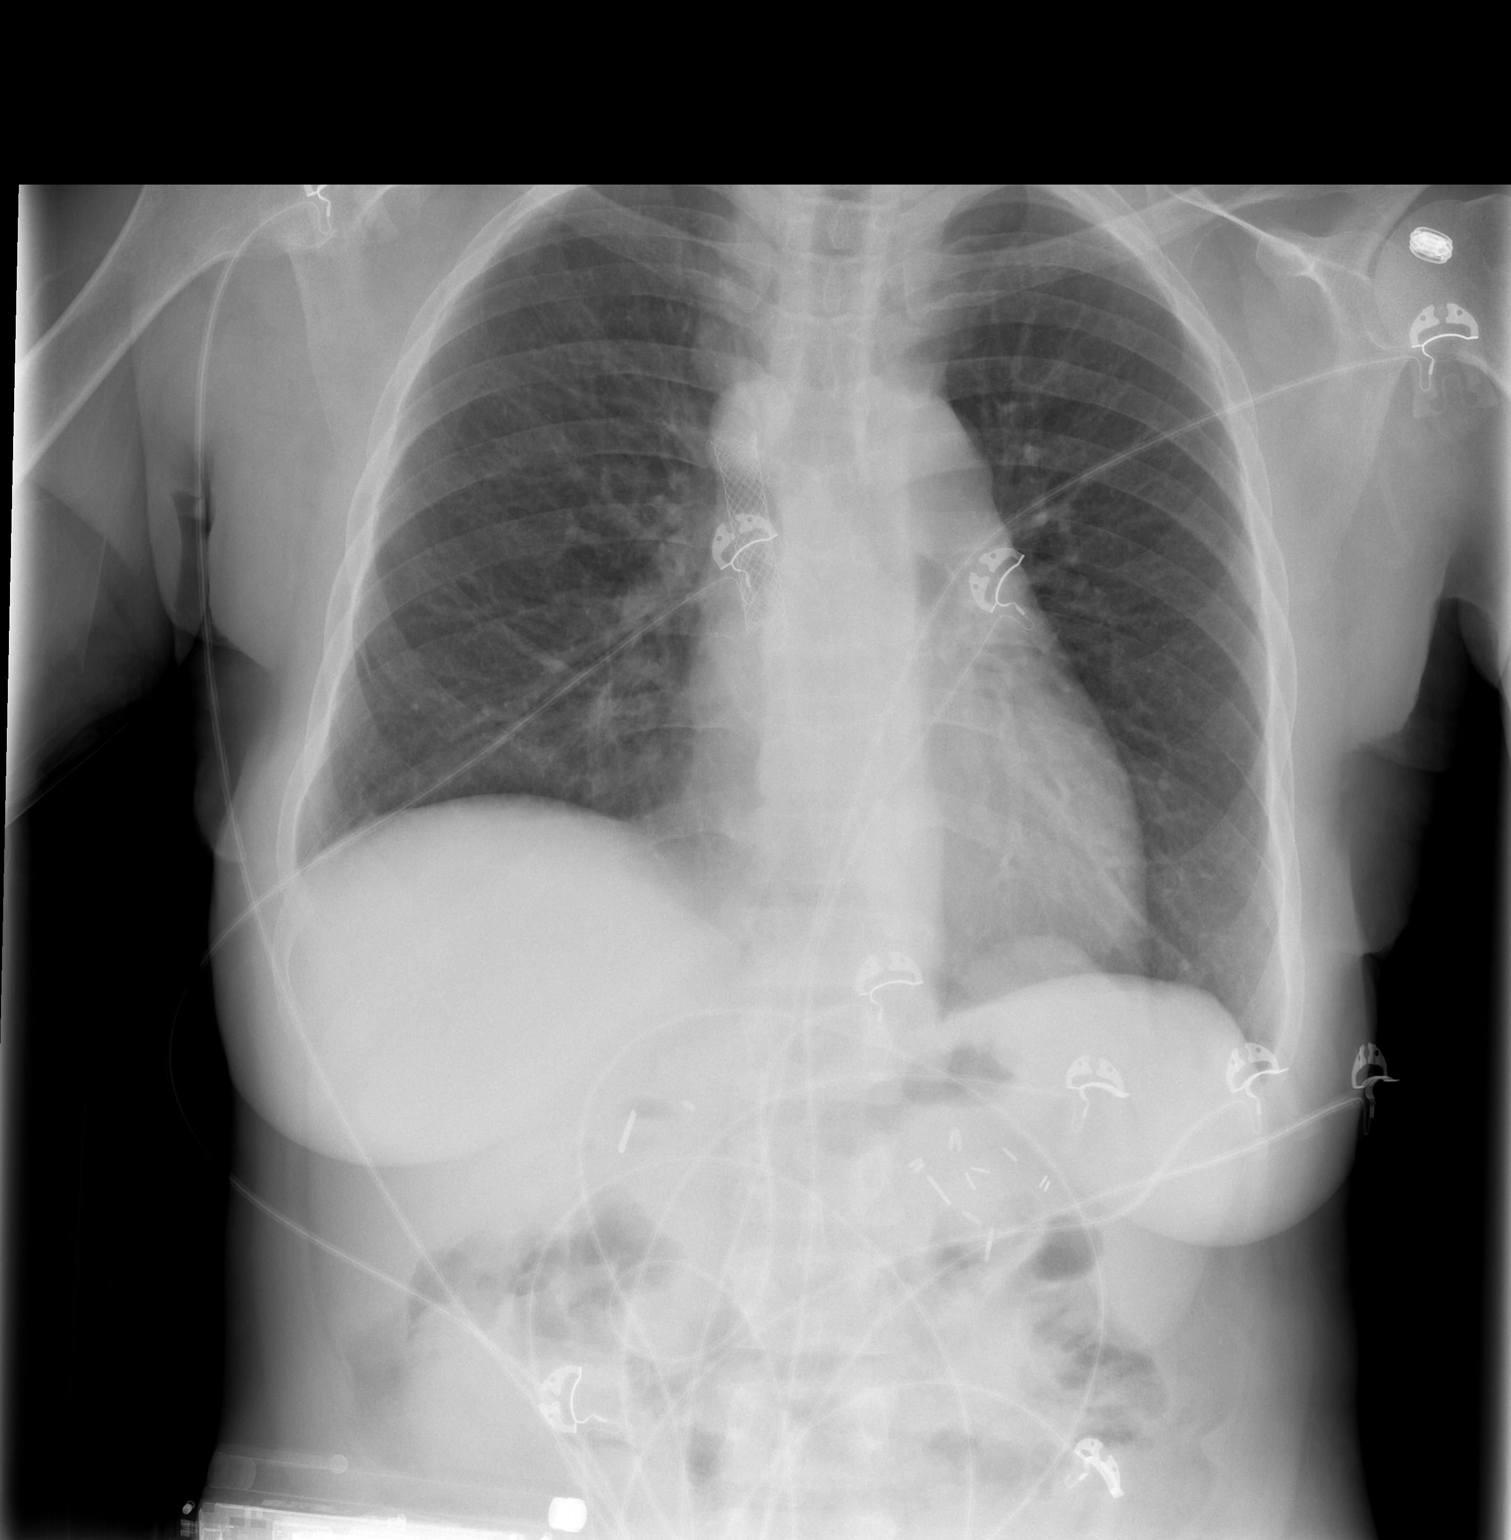

[w chest lat]
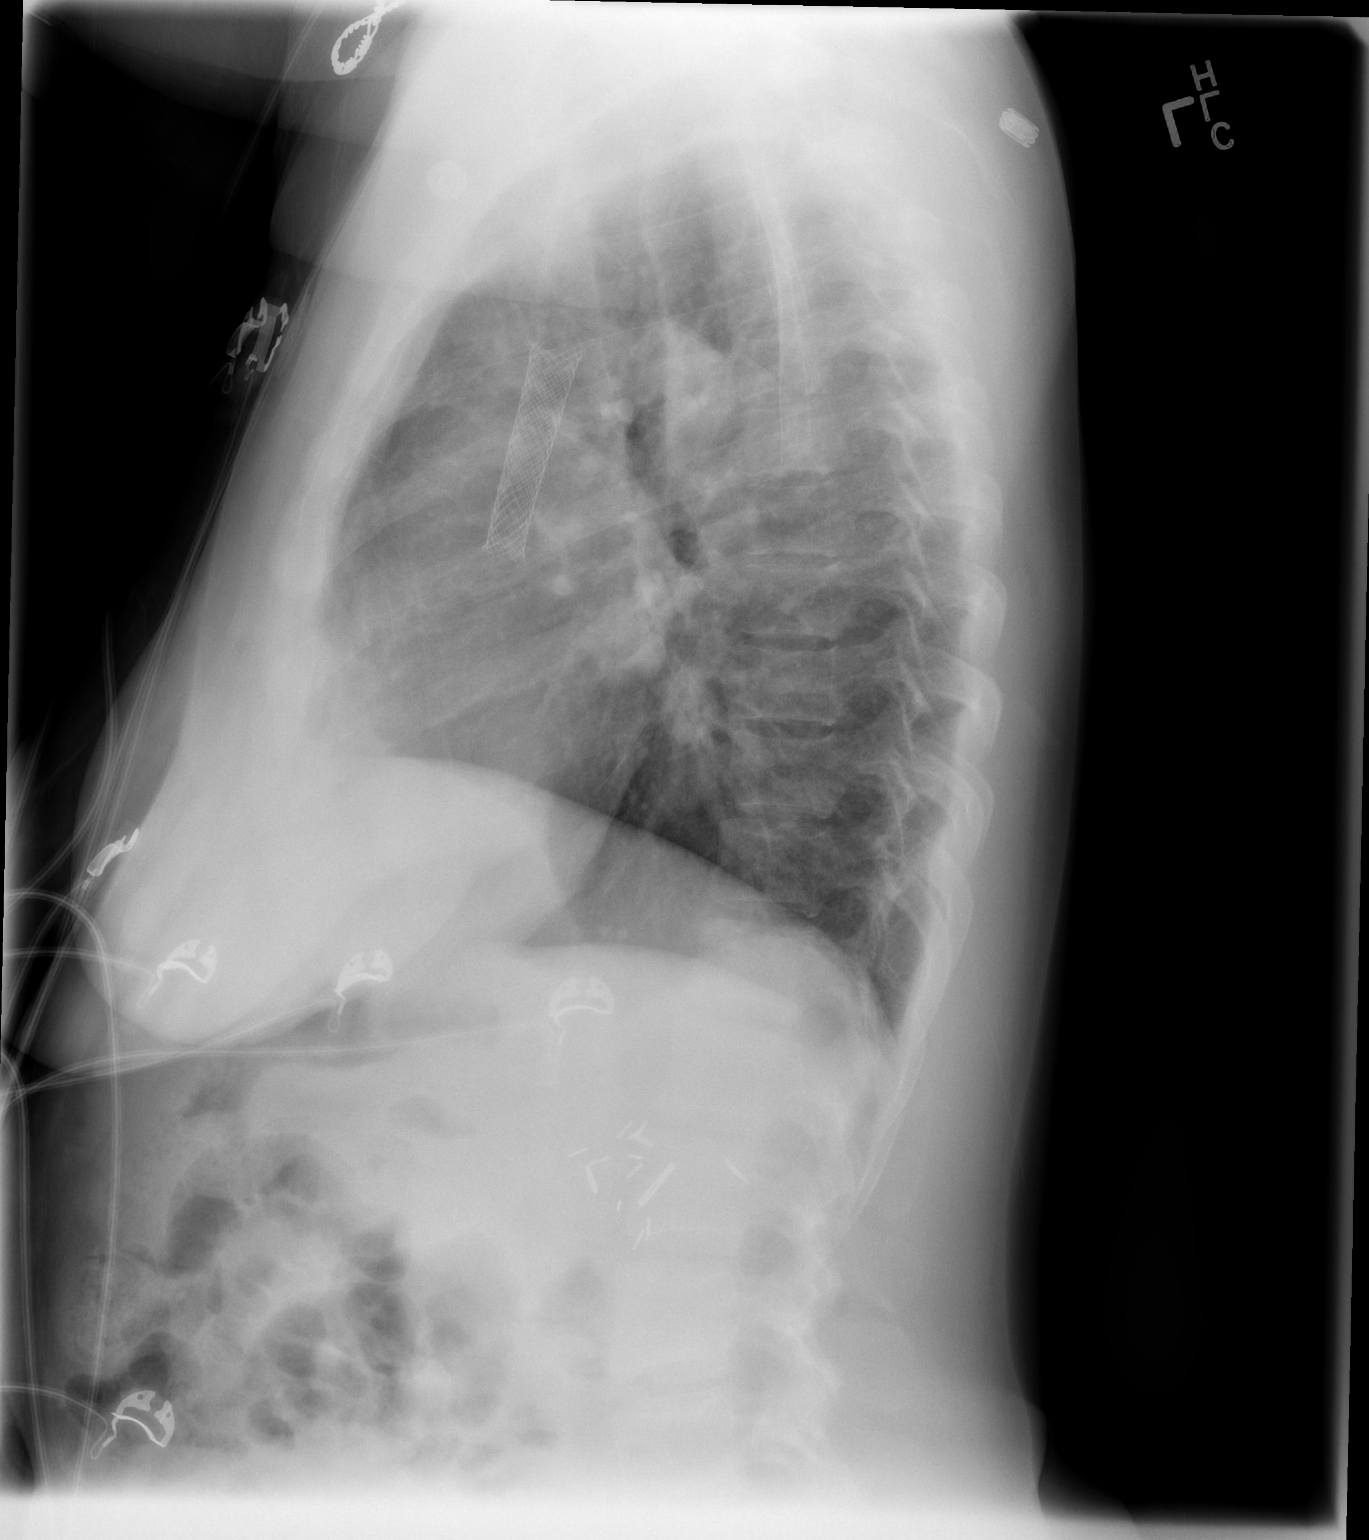

[2 of 2 positions shown; findings below may reference images not displayed]

FINDINGS: Elevation of the right hemidiaphragm with segmental
elevation of the left hemidiaphragm.  Normal heart size and
pulmonary vascularity.  Vascular stent in the right mediastinum.
No focal consolidation.  No blunting of costophrenic angles.  No
pneumothorax.  Postoperative changes in the upper abdomen.  No
significant change since previous study.
IMPRESSION: No evidence of active pulmonary disease.

## 2014-04-06 ENCOUNTER — Encounter (HOSPITAL_COMMUNITY): Payer: Self-pay | Admitting: Family Medicine

## 2014-04-06 ENCOUNTER — Emergency Department (HOSPITAL_COMMUNITY)
Admission: EM | Admit: 2014-04-06 | Discharge: 2014-04-06 | Disposition: A | Discharge status: Home / Self Care | Payer: Medicaid Other | Attending: Emergency Medicine | Admitting: Emergency Medicine

## 2014-04-06 ENCOUNTER — Emergency Department (HOSPITAL_COMMUNITY): Payer: Medicaid Other

## 2014-04-06 ENCOUNTER — Encounter (HOSPITAL_COMMUNITY)
Admission: RE | Admit: 2014-04-06 | Discharge: 2014-04-06 | Disposition: A | Discharge status: Home / Self Care | Payer: Medicaid Other | Source: Ambulatory Visit | Attending: Nephrology | Admitting: Nephrology

## 2014-04-06 DIAGNOSIS — D631 Anemia in chronic kidney disease: Secondary | ICD-10-CM | POA: Insufficient documentation

## 2014-04-06 DIAGNOSIS — N186 End stage renal disease: Secondary | ICD-10-CM | POA: Insufficient documentation

## 2014-04-06 DIAGNOSIS — Z940 Kidney transplant status: Secondary | ICD-10-CM | POA: Insufficient documentation

## 2014-04-06 DIAGNOSIS — M542 Cervicalgia: Secondary | ICD-10-CM | POA: Diagnosis not present

## 2014-04-06 DIAGNOSIS — M25511 Pain in right shoulder: Secondary | ICD-10-CM | POA: Diagnosis not present

## 2014-04-06 DIAGNOSIS — Z8619 Personal history of other infectious and parasitic diseases: Secondary | ICD-10-CM | POA: Diagnosis not present

## 2014-04-06 DIAGNOSIS — D638 Anemia in other chronic diseases classified elsewhere: Secondary | ICD-10-CM | POA: Diagnosis present

## 2014-04-06 DIAGNOSIS — G8929 Other chronic pain: Secondary | ICD-10-CM

## 2014-04-06 DIAGNOSIS — M109 Gout, unspecified: Secondary | ICD-10-CM | POA: Insufficient documentation

## 2014-04-06 DIAGNOSIS — K219 Gastro-esophageal reflux disease without esophagitis: Secondary | ICD-10-CM | POA: Insufficient documentation

## 2014-04-06 DIAGNOSIS — Z7952 Long term (current) use of systemic steroids: Secondary | ICD-10-CM | POA: Insufficient documentation

## 2014-04-06 DIAGNOSIS — Z720 Tobacco use: Secondary | ICD-10-CM | POA: Diagnosis not present

## 2014-04-06 DIAGNOSIS — Z7982 Long term (current) use of aspirin: Secondary | ICD-10-CM | POA: Insufficient documentation

## 2014-04-06 DIAGNOSIS — Z79899 Other long term (current) drug therapy: Secondary | ICD-10-CM | POA: Diagnosis not present

## 2014-04-06 DIAGNOSIS — Z8673 Personal history of transient ischemic attack (TIA), and cerebral infarction without residual deficits: Secondary | ICD-10-CM | POA: Insufficient documentation

## 2014-04-06 DIAGNOSIS — Z8659 Personal history of other mental and behavioral disorders: Secondary | ICD-10-CM | POA: Diagnosis not present

## 2014-04-06 DIAGNOSIS — Z992 Dependence on renal dialysis: Secondary | ICD-10-CM | POA: Diagnosis not present

## 2014-04-06 DIAGNOSIS — N185 Chronic kidney disease, stage 5: Secondary | ICD-10-CM | POA: Insufficient documentation

## 2014-04-06 DIAGNOSIS — Z862 Personal history of diseases of the blood and blood-forming organs and certain disorders involving the immune mechanism: Secondary | ICD-10-CM | POA: Insufficient documentation

## 2014-04-06 LAB — RENAL FUNCTION PANEL
Albumin: 4 g/dL (ref 3.5–5.2)
Anion gap: 8 (ref 5–15)
BUN: 31 mg/dL — ABNORMAL HIGH (ref 6–23)
CO2: 25 mmol/L (ref 19–32)
Calcium: 9.5 mg/dL (ref 8.4–10.5)
Chloride: 107 mmol/L (ref 96–112)
Creatinine, Ser: 2.28 mg/dL — ABNORMAL HIGH (ref 0.50–1.10)
GFR calc Af Amer: 28 mL/min — ABNORMAL LOW
GFR calc non Af Amer: 25 mL/min — ABNORMAL LOW
Glucose, Bld: 100 mg/dL — ABNORMAL HIGH (ref 70–99)
Phosphorus: 4.4 mg/dL (ref 2.3–4.6)
Potassium: 5 mmol/L (ref 3.5–5.1)
Sodium: 140 mmol/L (ref 135–145)

## 2014-04-06 LAB — COMPREHENSIVE METABOLIC PANEL WITH GFR
ALT: 10 U/L (ref 0–35)
AST: 16 U/L (ref 0–37)
Albumin: 3.7 g/dL (ref 3.5–5.2)
Alkaline Phosphatase: 81 U/L (ref 39–117)
Anion gap: 13 (ref 5–15)
BUN: 31 mg/dL — ABNORMAL HIGH (ref 6–23)
CO2: 21 mmol/L (ref 19–32)
Calcium: 9.6 mg/dL (ref 8.4–10.5)
Chloride: 106 mmol/L (ref 96–112)
Creatinine, Ser: 2.2 mg/dL — ABNORMAL HIGH (ref 0.50–1.10)
GFR calc Af Amer: 30 mL/min — ABNORMAL LOW
GFR calc non Af Amer: 26 mL/min — ABNORMAL LOW
Glucose, Bld: 80 mg/dL (ref 70–99)
Potassium: 4.1 mmol/L (ref 3.5–5.1)
Sodium: 140 mmol/L (ref 135–145)
Total Bilirubin: 0.7 mg/dL (ref 0.3–1.2)
Total Protein: 6.3 g/dL (ref 6.0–8.3)

## 2014-04-06 LAB — CBC WITH DIFFERENTIAL/PLATELET
Basophils Absolute: 0 10*3/uL (ref 0.0–0.1)
Basophils Relative: 0 % (ref 0–1)
Eosinophils Absolute: 0.1 10*3/uL (ref 0.0–0.7)
Eosinophils Relative: 3 % (ref 0–5)
HCT: 29.1 % — ABNORMAL LOW (ref 36.0–46.0)
Hemoglobin: 9.7 g/dL — ABNORMAL LOW (ref 12.0–15.0)
Lymphocytes Relative: 20 % (ref 12–46)
Lymphs Abs: 0.9 10*3/uL (ref 0.7–4.0)
MCH: 32.1 pg (ref 26.0–34.0)
MCHC: 33.3 g/dL (ref 30.0–36.0)
MCV: 96.4 fL (ref 78.0–100.0)
Monocytes Absolute: 0.4 10*3/uL (ref 0.1–1.0)
Monocytes Relative: 10 % (ref 3–12)
Neutro Abs: 3.1 10*3/uL (ref 1.7–7.7)
Neutrophils Relative %: 67 % (ref 43–77)
Platelets: 145 10*3/uL — ABNORMAL LOW (ref 150–400)
RBC: 3.02 MIL/uL — ABNORMAL LOW (ref 3.87–5.11)
RDW: 19.7 % — ABNORMAL HIGH (ref 11.5–15.5)
WBC: 4.6 10*3/uL (ref 4.0–10.5)

## 2014-04-06 LAB — POCT HEMOGLOBIN-HEMACUE: Hemoglobin: 9.2 g/dL — ABNORMAL LOW (ref 12.0–15.0)

## 2014-04-06 MED ORDER — OXYCODONE-ACETAMINOPHEN 5-325 MG PO TABS
2.0000 | ORAL_TABLET | Freq: Once | ORAL | Status: AC
  Administered 2014-04-06: 2 via ORAL
  Filled 2014-04-06: qty 2

## 2014-04-06 MED ORDER — METHOCARBAMOL 1000 MG/10ML IJ SOLN
500.0000 mg | Freq: Once | INTRAMUSCULAR | Status: AC
  Administered 2014-04-06: 500 mg via INTRAMUSCULAR
  Filled 2014-04-06: qty 5

## 2014-04-06 MED ORDER — EPOETIN ALFA 40000 UNIT/ML IJ SOLN
INTRAMUSCULAR | Status: AC
Start: 2014-04-06 — End: 2014-04-06
  Administered 2014-04-06: 40000 [IU] via SUBCUTANEOUS
  Filled 2014-04-06: qty 1

## 2014-04-06 MED ORDER — METHOCARBAMOL 500 MG PO TABS: 500.0000 mg | ORAL_TABLET | Freq: Two times a day (BID) | ORAL | Status: DC

## 2014-04-06 MED ORDER — OXYCODONE-ACETAMINOPHEN 5-325 MG PO TABS: 1.0000 | ORAL_TABLET | Freq: Four times a day (QID) | ORAL | Status: DC | PRN

## 2014-04-06 MED ORDER — EPOETIN ALFA 20000 UNIT/ML IJ SOLN: 40000.0000 [IU] | INTRAMUSCULAR | Status: DC

## 2014-04-06 NOTE — ED Provider Notes (Signed)
CSN: [id number]     Arrival date & time 04/06/14  1712 History   First MD Initiated Contact with Patient 04/06/14 1730     Chief Complaint  Patient presents with  . Shoulder Pain     (Consider location/radiation/quality/duration/timing/severity/associated sxs/prior Treatment) HPI Comments: Patient with a history of chronic neck pain and DDD of the cervical spine presents today with right sided neck pain.  She states that the pain has been intermittent for years, but worsened four days ago.  She states that in the past a muscle relaxer and pain medication have helped with the pain.  She was taking Oxycodone for the pain, but ran out 2 days ago.  She states that she has also had intermittent weakness of the right arm, which she states that she has also had in the past.  She states that the pain today feels similar to the pain that she has had in the past.  She denies acute injury or trauma.  Pain worse with palpation and also movement.  She states that she has been seen by Orthopedics for the pain in the past and has had a MRI done, which showed DDD.  She denies fever, chills, SOB, cough, or chest pain.  She has not had any erythema, warmth, or edema of the right arm.  She does have a history of Renal Transplant and is currently on chronic Prednisone and also Prograf.    Patient is a 49 y.o. female presenting with shoulder pain.  Shoulder Pain   Past Medical History  Diagnosis Date  . Gastroparesis   . Gout   . Hypotension   . Herpes genitalia 1994  . E coli bacteremia 06/18/2011  . Bacteremia due to Gram-negative bacteria 05/23/2011  . History of blood transfusion     "more than a few times" (12/11/2011)  . History of stomach ulcers   . Stroke      left basal ganglia lacunar infarct; Right frontal lobe lacunar infarct.  . Stroke ~ 1999; 2001    "briefly lost my vision; lost my right eye" (12/11/2011)  . Chronic lower back pain   . Depression   . Osteopenia   . Headache(784.0)     "not  often anymore" (12/11/2011)  . Complication of anesthesia     "woke up during OR; I have an extremely high tolerance" (12/11/2011)  . Dysrhythmia     "tachycardia" (12/11/2011)  . ESRD (end stage renal disease) 06/12/2011  . Iron deficiency anemia   . Seizures 1994    "post transplant; only have had that one" (12/11/2011)  . GERD (gastroesophageal reflux disease)    Past Surgical History  Procedure Laterality Date  . Kidney transplant  1994; 1999; 2005    "right"  . Appendectomy  ~ 2004  . 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 Total nephrectomy  1988?; 1994; 2005  . Multiple tooth extractions    . Enucleation  2001    "right"  . Esophagogastroduodenoscopy (egd) with propofol N/A 04/21/2012    Procedure: ESOPHAGOGASTRODUODENOSCOPY (EGD) WITH PROPOFOL;  Surgeon: Milus Banister, MD;  Location: [facility];  Service: Endoscopy;  Laterality: N/A;   Family History  Problem Relation Age of Onset  . Hypertension Maternal Grandmother    History  Substance Use Topics  . Smoking status: Current Some Day Smoker -- 0.50 packs/day for 28 years    Types: Cigarettes  .  Smokeless tobacco: Never Used     Comment: had quit 03/05/2011  . Alcohol Use: Yes     Comment: uses daily   OB History    No data available     Review of Systems  All other systems reviewed and are negative.     Allergies  Levofloxacin  Home Medications   Prior to Admission medications   Medication Sig Start Date End Date Taking? Authorizing Provider  aspirin EC 81 MG tablet Take 81 mg by mouth daily.   Yes Historical Provider, MD  azaTHIOprine (IMURAN) 50 MG tablet Take 100 mg by mouth daily.    Yes Historical Provider, MD  colchicine 0.6 MG tablet Take 0.6 mg by mouth daily as needed (For gout.).   Yes Historical Provider, MD  Darbepoetin Alfa-Albumin (ARANESP IJ) Inject as directed every 30 (thirty) days.    Yes Historical Provider, MD   dicyclomine (BENTYL) 20 MG tablet Take 1 tablet (20 mg total) by mouth 4 (four) times daily -  before meals and at bedtime. Patient taking differently: Take 20 mg by mouth 3 (three) times daily as needed.  01/25/14  Yes Orlie Dakin, MD  furosemide (LASIX) 40 MG tablet Take 40 mg by mouth every evening.  06/22/11  Yes [name], MD  HYDROcodone-acetaminophen [facility]) 10-325 MG per tablet Take 1 tablet by mouth every 6 (six) hours as needed for moderate pain.  01/09/14  Yes Historical Provider, MD  metoCLOPramide (REGLAN) 5 MG tablet TAKE 1 TABLET (5 MG TOTAL) BY MOUTH AT BEDTIME AND MAY REPEAT DOSE ONE TIME IF NEEDED. Patient taking differently: Take 5 mg by mouth 2 (two) times daily. Can take more doses prn. 09/13/13  Yes Milus Banister, MD  Multiple Vitamin (MULTIVITAMIN) tablet Take 1 tablet by mouth daily.   Yes Historical Provider, MD  nystatin (MYCOSTATIN) 100000 UNIT/ML suspension Take 5 mLs (500,000 Units total) by mouth 4 (four) times daily. Patient taking differently: Take 5 mLs by mouth 4 (four) times daily as needed.  09/13/13  Yes Milus Banister, MD  omeprazole (PRILOSEC) 40 MG capsule Take 40 mg by mouth 2 (two) times daily.    Yes Historical Provider, MD  ondansetron (ZOFRAN-ODT) 4 MG disintegrating tablet Take 1 tablet (4 mg total) by mouth every 6 (six) hours. Patient taking differently: Take 4 mg by mouth every 6 (six) hours as needed for nausea or vomiting.  05/18/13  Yes Amy S Esterwood, PA-C  oxyCODONE (ROXICODONE) 5 MG immediate release tablet Take 1 tab daily as needed for pain. Patient taking differently: Take 5 mg by mouth daily as needed (pain).  05/18/13  Yes Amy S Esterwood, PA-C  oxyCODONE-acetaminophen (PERCOCET) 7.5-325 MG per tablet Take 1 tablet by mouth every 4 (four) hours as needed for pain.   Yes Historical Provider, MD  predniSONE (DELTASONE) 5 MG tablet Take 5 mg by mouth daily. 06/22/11  Yes [name], MD  promethazine (PHENERGAN) 25 MG  suppository Place 1 suppository (25 mg total) rectally every 6 (six) hours as needed for nausea or vomiting. 05/18/13  Yes Amy S Esterwood, PA-C  promethazine (PHENERGAN) 25 MG tablet Take 1 tablet (25 mg total) by mouth every 6 (six) hours as needed for nausea or vomiting. 05/07/13  Yes Margarita Mail, PA-C  tacrolimus (PROGRAF) 1 MG capsule Take 3 mg by mouth 2 (two) times daily.  06/22/11  Yes [name], MD  triamcinolone cream (KENALOG) 0.1 % Apply 1 application topically as needed.  02/20/14  Yes Historical Provider, MD   BP 98/54 mmHg  Pulse 65  Temp(Src) 98.3 F (36.8 C)  Resp 16  SpO2 99% Physical Exam  Constitutional: She appears well-developed and well-nourished.  HENT:  Head: Normocephalic and atraumatic.  Neck: Normal range of motion. Neck supple. Spinous process tenderness and muscular tenderness present.  Tenderness to palpation of the trapezius and the cervical spine  Cardiovascular: Normal rate, regular rhythm, normal heart sounds and intact distal pulses.   Pulses:      Radial pulses are 2+ on the right side, and 2+ on the left side.  Pulmonary/Chest: Effort normal and breath sounds normal.  Musculoskeletal: Normal range of motion.       Right shoulder: She exhibits tenderness. She exhibits normal range of motion, no swelling, no effusion, no crepitus, no deformity, no spasm, normal pulse and normal strength.  Tenderness along the right trapezius.  Increased pain with lateral rotation of the neck and also abduction of the right shoulder.  No erythema, edema, or warmth of the right arm.  Neurological: She is alert.  Grip strength 5/5 bilaterally Distal sensation of  fingers intact bilaterally  Skin: Skin is warm and dry.  Psychiatric: She has a normal mood and affect.  Nursing note and vitals reviewed.   ED Course  Procedures (including critical care time) Labs Review Labs Reviewed  CBC WITH DIFFERENTIAL/PLATELET - Abnormal; Notable for the following:    RBC  3.02 (*)    Hemoglobin 9.7 (*)    HCT 29.1 (*)    RDW 19.7 (*)    Platelets 145 (*)    All other components within normal limits  COMPREHENSIVE METABOLIC PANEL - Abnormal; Notable for the following:    BUN 31 (*)    Creatinine, Ser 2.20 (*)    GFR calc non Af Amer 26 (*)    GFR calc Af Amer 30 (*)    All other components within normal limits    Imaging Review Dg Cervical Spine Complete  04/06/2014   CLINICAL DATA:  RIGHT shoulder pain radiating to the RIGHT side of the neck. Symptoms for 5 days. Initial encounter.  EXAM: CERVICAL SPINE  4+ VIEWS  COMPARISON:  CT 05/02/2009.  FINDINGS: Straightening of the normal cervical lordosis. Cervicothoracic junction appears within normal limits. 1 mm retrolisthesis of C3 on C4 associated with severe disc space collapse. Degenerative endplate sclerosis is present at C3-C4. This is an isolated finding with the other disc spaces preserved. Craniocervical alignment appears normal. Mild symmetric narrowing of the C3-C4 neural foramina is present associated with collapse of the disc space. The odontoid is intact.  IMPRESSION: Severe C3-C4 degenerative disc disease with mild symmetric bilateral bony foraminal stenosis. No definite change compared to CT 2011. No acute abnormality.   Electronically Signed   By: Dereck Ligas M.D.   On: 04/06/2014 19:27   Dg Shoulder Right  04/06/2014   CLINICAL DATA:  Right shoulder pain for 5 days without trauma.  EXAM: RIGHT SHOULDER - 2+ VIEW  COMPARISON:  10/11/2012  FINDINGS: AP and scapular views. No acute fracture or dislocation. Joint spaces maintained. Incompletely imaged right sided vascular stent. Suspect right upper lobe scarring. Similar.  IMPRESSION: No acute osseous abnormality.   Electronically Signed   By: Abigail Miyamoto M.D.   On: 04/06/2014 19:25     EKG Interpretation None      MDM   Final diagnoses:  None  Patient with a history of DDD of the Cervical Spine and chronic neck  pain presents today with pain  of the right neck, right upper back, and right shoulder area.  She states that pain is similar to pain that she has had in the past.  No injury or trauma.  No signs of infection or DVT on exam.  Neurovascularly intact.  Pain improved after given Robaxin and Percocet.  Xray of Cervical Spine showing Severe C3-C4 DDD with mild symmetric bilateral bony foraminal stenosis.  No definite change when compared to CT from 2011.  Labs today unremarkable.  Feel that the patient is stable for discharge.  Return precautions given.      Hyman Bible, PA-C 04/08/14 1308  Quintella Reichert, MD 04/08/14 [phone]

## 2014-04-06 NOTE — ED Notes (Signed)
Pt sts chronic right shoulder pain worsening over the last 4 days.

## 2014-04-06 NOTE — ED Provider Notes (Signed)
MSE was initiated and I personally evaluated the patient and placed orders (if any) at  6:25 PM on April 06, 2014.  Tina Watkins is a 49 year old female with past medical history of gastroparesis, gout, hypotension, Escherichia coli bacteremia, history of blood transfusions, stroke, chronic back pain, osteopenia, end-stage renal disease on prednisone and Prograf, seizures, GERD presenting to the emergency department with right shoulder pain that is been ongoing since Monday. Reported the right shoulder pain radiates to her right humerus and right side of the neck. Stated that the pain as a sharp, throbbing, stiffening discomfort. Reported weakness in the right hand and right arm. Reported that she's been having an intermittent headache. Stated that the pain worsens with motion. Reported that she ran out of oxycodone approximately 2 days ago. Reported that she's noticed swelling at the base of her neck on the right side. Denied jaw pain, chest pain, shortness of breath, difficulty breathing, swallowing, numbness, tingling, fever, chills, blurred vision, sudden loss of vision.  Alert and oriented. GCS 15. Heart rate and rhythm normal. Lungs clear to auscultation. Faint radial pulses bilaterally, right more so than the left. Cap refill less than 3 seconds. Full flexion, decreased extension secondary to pain to the right shoulder. Strength intact with equal distribution. Tenderness upon palpation to the right trapezius and right side the neck. Significant swelling to the base of the neck near the clavicular sternal region with negative erythema warmth upon palpation -tenderness noted upon palpation. Decreased range of motion to the neck, very minimal motion with turning head towards the left.  Based on patient's past medical history and presenting symptoms-patient does not meet fast or criteria. Orders have been placed and imaging has been placed. Patient to be transferred over to main ED for further workup  and assessment to be performed.  The patient appears stable so that the remainder of the MSE may be completed by another provider.  Jamse Mead, PA-C 04/06/14 [place], PA-C 04/06/14 1828  Quintella Reichert, MD 04/06/14 [phone]

## 2014-04-06 NOTE — ED Notes (Signed)
Pt st's she woke up with pain in right shoulder 5 days ago and thought she had slept on it wrong but right shoulder continues to be painful.  No known injury

## 2014-04-06 NOTE — ED Notes (Signed)
Patient transported to X-ray 

## 2014-04-09 ENCOUNTER — Telehealth (HOSPITAL_COMMUNITY): Payer: Self-pay

## 2014-05-04 ENCOUNTER — Encounter (HOSPITAL_COMMUNITY)
Admission: RE | Admit: 2014-05-04 | Discharge: 2014-05-04 | Disposition: A | Discharge status: Home / Self Care | Payer: Medicaid Other | Source: Ambulatory Visit | Attending: Nephrology | Admitting: Nephrology

## 2014-05-04 DIAGNOSIS — D638 Anemia in other chronic diseases classified elsewhere: Secondary | ICD-10-CM | POA: Diagnosis present

## 2014-05-04 DIAGNOSIS — N185 Chronic kidney disease, stage 5: Secondary | ICD-10-CM | POA: Diagnosis not present

## 2014-05-04 DIAGNOSIS — D631 Anemia in chronic kidney disease: Secondary | ICD-10-CM | POA: Diagnosis not present

## 2014-05-04 DIAGNOSIS — Z940 Kidney transplant status: Secondary | ICD-10-CM | POA: Diagnosis not present

## 2014-05-04 LAB — RENAL FUNCTION PANEL
Albumin: 4 g/dL (ref 3.5–5.2)
Anion gap: 8 (ref 5–15)
BUN: 37 mg/dL — ABNORMAL HIGH (ref 6–23)
CO2: 25 mmol/L (ref 19–32)
Calcium: 9.8 mg/dL (ref 8.4–10.5)
Chloride: 107 mmol/L (ref 96–112)
Creatinine, Ser: 2.37 mg/dL — ABNORMAL HIGH (ref 0.50–1.10)
GFR calc Af Amer: 27 mL/min — ABNORMAL LOW (ref >90)
GFR calc non Af Amer: 23 mL/min — ABNORMAL LOW (ref >90)
Glucose, Bld: 80 mg/dL (ref 70–99)
Phosphorus: 3.3 mg/dL (ref 2.3–4.6)
Potassium: 4.8 mmol/L (ref 3.5–5.1)
Sodium: 140 mmol/L (ref 135–145)

## 2014-05-04 LAB — POCT HEMOGLOBIN-HEMACUE: Hemoglobin: 9.8 g/dL — ABNORMAL LOW (ref 12.0–15.0)

## 2014-05-04 MED ORDER — EPOETIN ALFA 20000 UNIT/ML IJ SOLN: 40000.0000 [IU] | INTRAMUSCULAR | Status: DC

## 2014-05-04 MED ORDER — EPOETIN ALFA 40000 UNIT/ML IJ SOLN
INTRAMUSCULAR | Status: AC
  Administered 2014-05-04: 40000 [IU] via SUBCUTANEOUS
  Filled 2014-05-04: qty 1

## 2014-05-05 LAB — IRON AND TIBC
Iron: 107 ug/dL (ref 42–145)
Saturation Ratios: 58 % — ABNORMAL HIGH (ref 20–55)
TIBC: 185 ug/dL — ABNORMAL LOW (ref 250–470)
UIBC: 78 ug/dL — ABNORMAL LOW (ref 125–400)

## 2014-05-05 LAB — FERRITIN: Ferritin: 1363 ng/mL — ABNORMAL HIGH (ref 10–291)

## 2014-05-09 ENCOUNTER — Encounter: Payer: Self-pay | Admitting: Gastroenterology

## 2014-05-09 ENCOUNTER — Ambulatory Visit (INDEPENDENT_AMBULATORY_CARE_PROVIDER_SITE_OTHER): Payer: Medicaid Other | Admitting: Gastroenterology

## 2014-05-09 VITALS — BP 92/66 | HR 64 | Ht 59.0 in | Wt 111.0 lb

## 2014-05-09 DIAGNOSIS — K3184 Gastroparesis: Secondary | ICD-10-CM

## 2014-05-09 NOTE — Progress Notes (Signed)
Review of pertinent gastrointestinal problems:  1. Chronic nausea, vomiting. EGD August 2009 was normal except for small hiatal hernia. Status post kidney transplant on immunosuppressive medicines which could cause nausea, narcotics periodically. Normal complete metabolic profile, slight anemia, labs July 2009. Changing her Myfortic to Imuran helped nausea temporarily, symptoms returned. GES 03/2008 slightly slow emptying. [name] 2/201normal GB, normal bilary tree. Hospitalized 03/2008 [facility] MD put her on reglan 5mg  three times a day, GI was not consulted. March, 2010 combination of daily scheduled Zofran and t.i.d. Reglan 5 mg a day has been helpful. She was cut back to Reglan as needed to due concern for potential side effects. June 2011: Was recommended to take 1-2 Reglan pills at bedtime only. Also started Megace for appetite stimulant. January 2013: She has gained 20 pounds and her nausea is under much better control with nightly Reglan. 04/2012 EGD Ardis Hughs; normal except for possible candida in esophagus, treated.  09/2013 intermittent exacerbations of her nausea, multifactorial.  Chronic symptoms under good control with reglan, zofran.  HPI: This is a  very pleasant 49 year old woman whom I last saw about 8 months ago. She has intermittent episodes of nausea and vomiting are multifactorial, see above  Just had a 'flare up,' of nausea/vomiting.  Lasted past 2-3 days.  Wasn't as severe as some of her episodes.    Has tried to stay up with hydration.  Drinking water and soup past couple days.  Weighs 1 pound more than 8 months ago.    Uses suppositories periodically as well.  She thinks this episode was due to drinking a soda in early AM.    Past Medical History  Diagnosis Date  . Gastroparesis   . Gout   . Hypotension   . Herpes genitalia 1994  . E coli bacteremia 06/18/2011  . Bacteremia due to Gram-negative bacteria 05/23/2011  . History of blood transfusion     "more than a few times"  (12/11/2011)  . History of stomach ulcers   . Stroke      left basal ganglia lacunar infarct; Right frontal lobe lacunar infarct.  . Stroke ~ 1999; 2001    "briefly lost my vision; lost my right eye" (12/11/2011)  . Chronic lower back pain   . Depression   . Osteopenia   . Headache(784.0)     "not often anymore" (12/11/2011)  . Complication of anesthesia     "woke up during OR; I have an extremely high tolerance" (12/11/2011)  . Dysrhythmia     "tachycardia" (12/11/2011)  . ESRD (end stage renal disease) 06/12/2011  . Iron deficiency anemia   . Seizures 1994    "post transplant; only have had that one" (12/11/2011)  . GERD (gastroesophageal reflux disease)   . DDD (degenerative disc disease), cervical     Past Surgical History  Procedure Laterality Date  . Kidney transplant  1994; 1999; 2005    "right"  . Appendectomy  ~ 2004  . 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 Total nephrectomy  1988?; 1994; 2005  . Multiple tooth extractions    . Enucleation  2001    "right"  . Esophagogastroduodenoscopy (egd) with propofol N/A 04/21/2012    Procedure: ESOPHAGOGASTRODUODENOSCOPY (EGD) WITH PROPOFOL;  Surgeon: Milus Banister, MD;  Location: [facility];  Service: Endoscopy;  Laterality: N/A;    Current Outpatient Prescriptions  Medication Sig Dispense Refill  . aspirin EC 81  MG tablet Take 81 mg by mouth daily.    [name] azaTHIOprine (IMURAN) 50 MG tablet Take 100 mg by mouth daily.     . colchicine 0.6 MG tablet Take 0.6 mg by mouth daily as needed (For gout.).    . Darbepoetin Alfa-Albumin (ARANESP IJ) Inject as directed every 30 (thirty) days.     [name] dicyclomine (BENTYL) 20 MG tablet Take 1 tablet (20 mg total) by mouth 4 (four) times daily -  before meals and at bedtime. (Patient taking differently: Take 20 mg by mouth 3 (three) times daily as needed. ) 60 tablet 0  . furosemide (LASIX) 40 MG tablet Take 40 mg by mouth every  evening.     [name] HYDROcodone-acetaminophen (NORCO) 10-325 MG per tablet Take 1 tablet by mouth every 6 (six) hours as needed for moderate pain.   0  . methocarbamol (ROBAXIN) 500 MG tablet Take 1 tablet (500 mg total) by mouth 2 (two) times daily. 20 tablet 0  . metoCLOPramide (REGLAN) 5 MG tablet TAKE 1 TABLET (5 MG TOTAL) BY MOUTH AT BEDTIME AND MAY REPEAT DOSE ONE TIME IF NEEDED. (Patient taking differently: Take 5 mg by mouth 2 (two) times daily. Can take more doses prn.) 60 tablet 11  . Multiple Vitamin (MULTIVITAMIN) tablet Take 1 tablet by mouth daily.    [name] nystatin (MYCOSTATIN) 100000 UNIT/ML suspension Take 5 mLs (500,000 Units total) by mouth 4 (four) times daily. (Patient taking differently: Take 5 mLs by mouth 4 (four) times daily as needed. ) 240 mL 11  . omeprazole (PRILOSEC) 40 MG capsule Take 40 mg by mouth 2 (two) times daily.     . ondansetron (ZOFRAN-ODT) 4 MG disintegrating tablet Take 1 tablet (4 mg total) by mouth every 6 (six) hours. (Patient taking differently: Take 4 mg by mouth every 6 (six) hours as needed for nausea or vomiting. ) 40 tablet 0  . oxyCODONE (ROXICODONE) 5 MG immediate release tablet Take 1 tab daily as needed for pain. (Patient taking differently: Take 5 mg by mouth daily as needed (pain). ) 30 tablet 0  . oxyCODONE-acetaminophen (PERCOCET/ROXICET) 5-325 MG per tablet Take 1-2 tablets by mouth every 6 (six) hours as needed for severe pain. 20 tablet 0  . predniSONE (DELTASONE) 5 MG tablet Take 5 mg by mouth daily.    . promethazine (PHENERGAN) 25 MG suppository Place 1 suppository (25 mg total) rectally every 6 (six) hours as needed for nausea or vomiting. 6 each 1  . promethazine (PHENERGAN) 25 MG tablet Take 1 tablet (25 mg total) by mouth every 6 (six) hours as needed for nausea or vomiting. 12 tablet 0  . tacrolimus (PROGRAF) 1 MG capsule Take 3 mg by mouth 2 (two) times daily.     [name] triamcinolone cream (KENALOG) 0.1 % Apply 1 application topically as  needed.   2   No current facility-administered medications for this visit.    Allergies as of 05/09/2014 - Review Complete 05/09/2014  Allergen Reaction Noted  . Levofloxacin Itching and Rash 05/23/2011    Family History  Problem Relation Age of Onset  . Hypertension Maternal Grandmother     History   Social History  . Marital Status: Single    Spouse Name: N/A  . Number of Children: 0  . Years of Education: N/A   Occupational History  . disabiled    Social History Main Topics  . Smoking status: Current Some Day Smoker -- 0.50 packs/day for 28 years  Types: Cigarettes  . Smokeless tobacco: Never Used     Comment: had quit 03/05/2011  . Alcohol Use: Yes     Comment: uses daily  . Drug Use: Yes    Special: Marijuana  . Sexual Activity: Yes   Other Topics Concern  . Not on file   Social History Narrative      Physical Exam: BP 92/66 mmHg  Pulse 64  Ht 4\' 11"  (1.499 m)  Wt 111 lb (50.349 kg)  BMI 22.41 kg/m2 Constitutional: Chronically ill-appearing Psychiatric: alert and oriented x3 Abdomen: soft, nontender, nondistended, no obvious ascites, no peritoneal signs, normal bowel sounds     Assessment and plan: 49 y.o. female with gastroparesis, multifactorial nausea vomiting  Some of her medicines contribute to nausea, she also probably has underlying gastroparesis. It sounds like she manages recent episode pretty well. I told her doing whatever she can to avoid going to the emergency room is a great idea. I recommended that she consider urgent clinic that she does have to go in there and they often have the ability to give IV fluids which could certainly help during an episode of her nausea vomiting. She does not think she needs any refills on her chronic medicines currently but will call if she does. She will return on an as-needed basis.

## 2014-05-09 NOTE — Patient Instructions (Addendum)
One of your biggest health concerns is your smoking.  This increases your risk for most cancers and serious cardiovascular diseases such as strokes, heart attacks.  You should try your best to stop.  If you need assistance, please contact your PCP or Smoking Cessation Class at [facility] [phone]) or [facility] (1-800-QUIT-NOW). Continue twice daily reglan; zofran and phenergan as needed. Stay hydrated as best that you can. Call if you need refills of your anti-nausea meds.

## 2014-05-18 ENCOUNTER — Ambulatory Visit: Payer: Medicaid Other | Attending: Orthopedic Surgery | Admitting: Physical Therapy

## 2014-05-18 DIAGNOSIS — M6248 Contracture of muscle, other site: Secondary | ICD-10-CM | POA: Diagnosis not present

## 2014-05-18 DIAGNOSIS — M62838 Other muscle spasm: Secondary | ICD-10-CM

## 2014-05-18 DIAGNOSIS — M5010 Cervical disc disorder with radiculopathy, unspecified cervical region: Secondary | ICD-10-CM

## 2014-05-18 NOTE — Patient Instructions (Addendum)
Posture Tips DO: - stand tall and erect - keep chin tucked in - keep head and shoulders in alignment - check posture regularly in mirror or large window - pull head back against headrest in car seat;  Change your position often.  Sit with lumbar support. DON'T: - slouch or slump while watching TV or reading - sit, stand or lie in one position  for too long;  Sitting is especially hard on the spine so if you sit at a desk/use the computer, then stand up often!   Copyright  VHI. All rights reserved.  Posture - Standing   Good posture is important. Avoid slouching and forward head thrust. Maintain curve in low back and align ears over shoul- ders, hips over ankles.  Pull your belly button in toward your back bone.   Copyright  VHI. All rights reserved.  Posture - Sitting   Sit upright, head facing forward. Try using a roll to support lower back. Keep shoulders relaxed, and avoid rounded back. Keep hips level with knees. Avoid crossing legs for long periods.   Copyright  VHI. All rights reserved.    

## 2014-05-18 NOTE — Therapy (Signed)
[place], [place], [address] Phone: [phone]   Fax:  [phone]  Physical Therapy Evaluation/DISCHARGE  Patient Details  Name: Tina Watkins MRN: [id number] Date of Birth: [birth year] Referring Provider:  Almedia Balls, MD  Encounter Date: 05/18/2014      PT End of Session - 05/18/14 0917    Visit Number 1   Number of Visits 1   PT Start Time 0805   PT Stop Time 0910   PT Time Calculation (min) 65 min   Activity Tolerance Patient limited by pain      Past Medical History  Diagnosis Date  . Gastroparesis   . Gout   . Hypotension   . Herpes genitalia 1994  . E coli bacteremia 06/18/2011  . Bacteremia due to Gram-negative bacteria 05/23/2011  . History of blood transfusion     "more than a few times" (12/11/2011)  . History of stomach ulcers   . Stroke      left basal ganglia lacunar infarct; Right frontal lobe lacunar infarct.  . Stroke ~ 1999; 2001    "briefly lost my vision; lost my right eye" (12/11/2011)  . Chronic lower back pain   . Depression   . Osteopenia   . Headache(784.0)     "not often anymore" (12/11/2011)  . Complication of anesthesia     "woke up during OR; I have an extremely high tolerance" (12/11/2011)  . Dysrhythmia     "tachycardia" (12/11/2011)  . ESRD (end stage renal disease) 06/12/2011  . Iron deficiency anemia   . Seizures 1994    "post transplant; only have had that one" (12/11/2011)  . GERD (gastroesophageal reflux disease)   . DDD (degenerative disc disease), cervical     Past Surgical History  Procedure Laterality Date  . Kidney transplant  1994; 1999; 2005    "right"  . Appendectomy  ~ 2004  . 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 Total nephrectomy  1988?; 1994; 2005  . Multiple tooth extractions    . Enucleation  2001    "right"  . Esophagogastroduodenoscopy (egd) with propofol N/A  04/21/2012    Procedure: ESOPHAGOGASTRODUODENOSCOPY (EGD) WITH PROPOFOL;  Surgeon: Milus Banister, MD;  Location: [facility];  Service: Endoscopy;  Laterality: N/A;    There were no vitals filed for this visit.  Visit Diagnosis:  Cervical disc disorder with radiculopathy of cervical region - Plan: PT plan of care cert/re-cert  Muscle spasms of neck - Plan: PT plan of care cert/re-cert      Subjective Assessment - 05/18/14 0815    Subjective Pain in neck and back of shoulder, radiating to upper arm.  Pain began "years ago".  Denies numbness/tingling, weakness.  She has difficulty with ADLs, housework, sitting, driving.    Pertinent History LBP   How long can you sit comfortably? any length of time   Diagnostic tests XR and MRI, severe DDD and stenosis   Patient Stated Goals to get as much treatment as possible   Currently in Pain? Yes   Pain Score 10-Worst pain ever   Pain Location Shoulder   Pain Orientation Right   Pain Descriptors / Indicators Pounding;Spasm   Pain Type Chronic pain   Pain Radiating Towards upper arm   Pain Onset More than a month ago   Pain Frequency Constant   Pain Relieving Factors nothing except supine  [facility] PT Assessment - 05/18/14 0826    Assessment   Medical Diagnosis cervical radiculopathy   Onset Date --  chronic   Prior Therapy no   Precautions   Precautions None   Restrictions   Weight Bearing Restrictions No   Balance Screen   Has the patient fallen in the past 6 months No   Prior Function   Level of Independence Independent with basic ADLs;Independent with homemaking with ambulation   Cognition   Overall Cognitive Status Within Functional Limits for tasks assessed   Sensation   Light Touch Appears Intact   Posture/Postural Control   Postural Limitations Rounded Shoulders;Forward head   AROM   Cervical Flexion 40   Cervical Extension 38   Cervical - Right Side Bend 30   Cervical - Left Side Bend 40   Cervical -  Right Rotation 50  spasm   Cervical - Left Rotation 50   Strength   Right Shoulder Flexion 3/5   Right Shoulder ABduction 3/5   Left Shoulder Flexion 4/5   Left Shoulder ABduction 4+/5   Palpation   Palpation muscle spasms and hypertonic throughout cervicals R>L, pain along Rt. suboccipitals, gross scapular soreness                           PT Education - 05/18/14 0917    Education provided Yes   Education Details HEP, posture, traction, Home TENS and [facility] coverage   Person(s) Educated Patient   Methods Explanation;Demonstration;Handout   Comprehension Verbalized understanding;Returned demonstration                    Plan - 05/18/14 [id number]    Clinical Impression Statement Patient presents with significant muscle spasm, tightness in cervicals, weakness and postural abnormality. She was treated with traction today, unable to state clearly if she found it beneficial.  She was given resources for further treatment including Home TENS and [place] clinic.  [facility] will not pay for PT for this diagnosis.    Pt will benefit from skilled therapeutic intervention in order to improve on the following deficits Decreased range of motion;Impaired flexibility;Postural dysfunction;Increased fascial restricitons;Pain;Impaired UE functional use;Increased muscle spasms;Decreased strength   Rehab Potential Good   PT Frequency One time visit   PT Next Visit Plan NA   PT Home Exercise Plan given chin tuck and CROM   Consulted and Agree with Plan of Care Patient         Problem List Patient Active Problem List   Diagnosis Date Noted  . Chronic pain disorder 04/09/2012  . Dehydration, mild 04/09/2012  . Gout attack 06/23/2011  . Herpes infection 06/23/2011  . Anxiety 06/23/2011  . E coli bacteremia 06/18/2011  . ESRD (end stage renal disease) 06/12/2011  . UTI (lower urinary tract infection) 06/12/2011  . Bacteremia due to Gram-negative bacteria 05/23/2011  . History of  renal transplantation 05/22/2011  . Septic shock(785.52) 05/22/2011  . Acute on chronic kidney failure 05/22/2011  . Gastroparesis 04/24/2008  . WEIGHT LOSS 08/24/2007  . NAUSEA AND VOMITING 08/24/2007    PAA,JENNIFER 05/18/2014, 9:32 AM  [facility] [address] [place], [place], [address] Phone: [phone]   Fax:  [phone] Raeford Razor, PT 05/18/2014 9:33 AM Phone: [phone] Fax: [phone]

## 2014-06-01 ENCOUNTER — Telehealth: Payer: Self-pay | Admitting: Gastroenterology

## 2014-06-01 DIAGNOSIS — K3184 Gastroparesis: Secondary | ICD-10-CM

## 2014-06-01 NOTE — Telephone Encounter (Signed)
Dr Ardis Hughs can I make this referral?

## 2014-06-04 NOTE — Telephone Encounter (Signed)
Yes, please refer to dietician for this, thanks

## 2014-06-05 NOTE — Telephone Encounter (Signed)
Pt has been notified that a referral has been made and she should receive a call from that office with appt.  She will call if she does not hear from that office in 1 week

## 2014-06-08 ENCOUNTER — Encounter (HOSPITAL_COMMUNITY)
Admission: RE | Admit: 2014-06-08 | Discharge: 2014-06-08 | Disposition: A | Discharge status: Home / Self Care | Payer: Medicaid Other | Source: Ambulatory Visit | Attending: Nephrology | Admitting: Nephrology

## 2014-06-08 DIAGNOSIS — Z940 Kidney transplant status: Secondary | ICD-10-CM | POA: Insufficient documentation

## 2014-06-08 DIAGNOSIS — D631 Anemia in chronic kidney disease: Secondary | ICD-10-CM | POA: Insufficient documentation

## 2014-06-08 DIAGNOSIS — D638 Anemia in other chronic diseases classified elsewhere: Secondary | ICD-10-CM | POA: Diagnosis present

## 2014-06-08 DIAGNOSIS — N185 Chronic kidney disease, stage 5: Secondary | ICD-10-CM | POA: Insufficient documentation

## 2014-06-08 LAB — RENAL FUNCTION PANEL
Albumin: 4.3 g/dL (ref 3.5–5.0)
Anion gap: 11 (ref 5–15)
BUN: 43 mg/dL — ABNORMAL HIGH (ref 6–20)
CO2: 24 mmol/L (ref 22–32)
Calcium: 10.1 mg/dL (ref 8.9–10.3)
Chloride: 103 mmol/L (ref 101–111)
Creatinine, Ser: 2.74 mg/dL — ABNORMAL HIGH (ref 0.44–1.00)
GFR calc Af Amer: 23 mL/min — ABNORMAL LOW (ref >60)
GFR calc non Af Amer: 20 mL/min — ABNORMAL LOW (ref >60)
Glucose, Bld: 94 mg/dL (ref 70–99)
Phosphorus: 4 mg/dL (ref 2.5–4.6)
Potassium: 4.5 mmol/L (ref 3.5–5.1)
Sodium: 138 mmol/L (ref 135–145)

## 2014-06-08 LAB — POCT HEMOGLOBIN-HEMACUE: Hemoglobin: 10.4 g/dL — ABNORMAL LOW (ref 12.0–15.0)

## 2014-06-08 IMAGING — CR DG ABDOMEN ACUTE W/ 1V CHEST
3 series · 3 of 3 positions shown · non-contrast
Comparison: 01/15/2012 chest film.  Acute abdomen series of
12/10/2011.

CLINICAL DATA: Chest of abdominal pain.  Diarrhea and vomiting.
History of right renal transplant.  Hypertension.  Ex-smoker.

ACUTE ABDOMEN SERIES (ABDOMEN 2 VIEW & CHEST 1 VIEW)

[w chest pa]
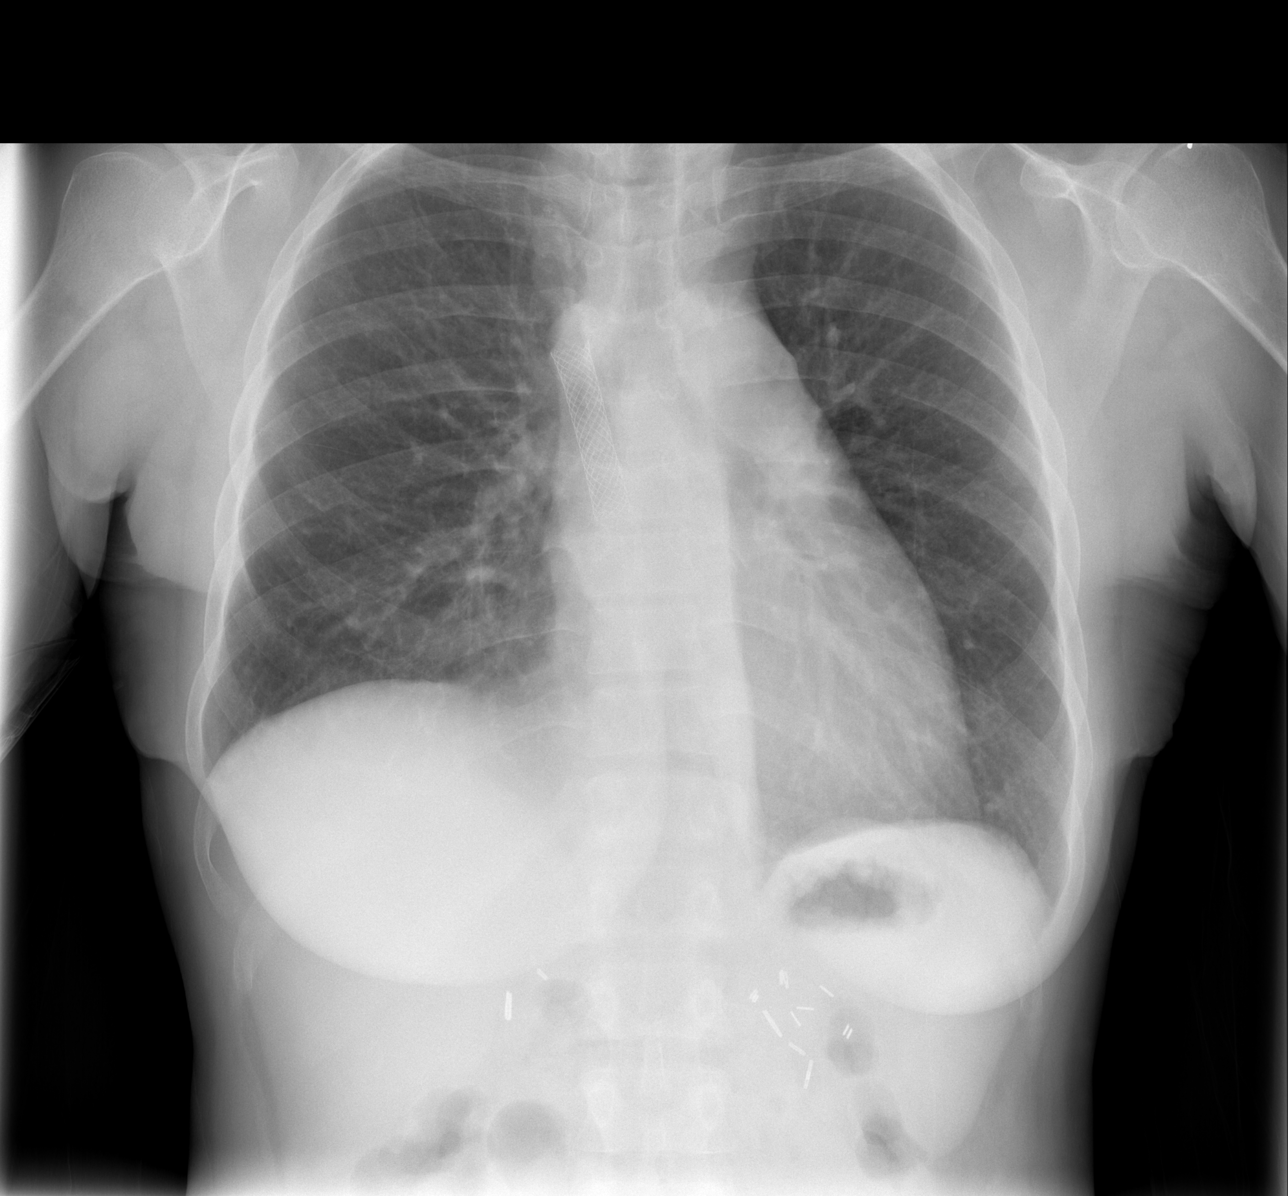

[w abdomen upright]
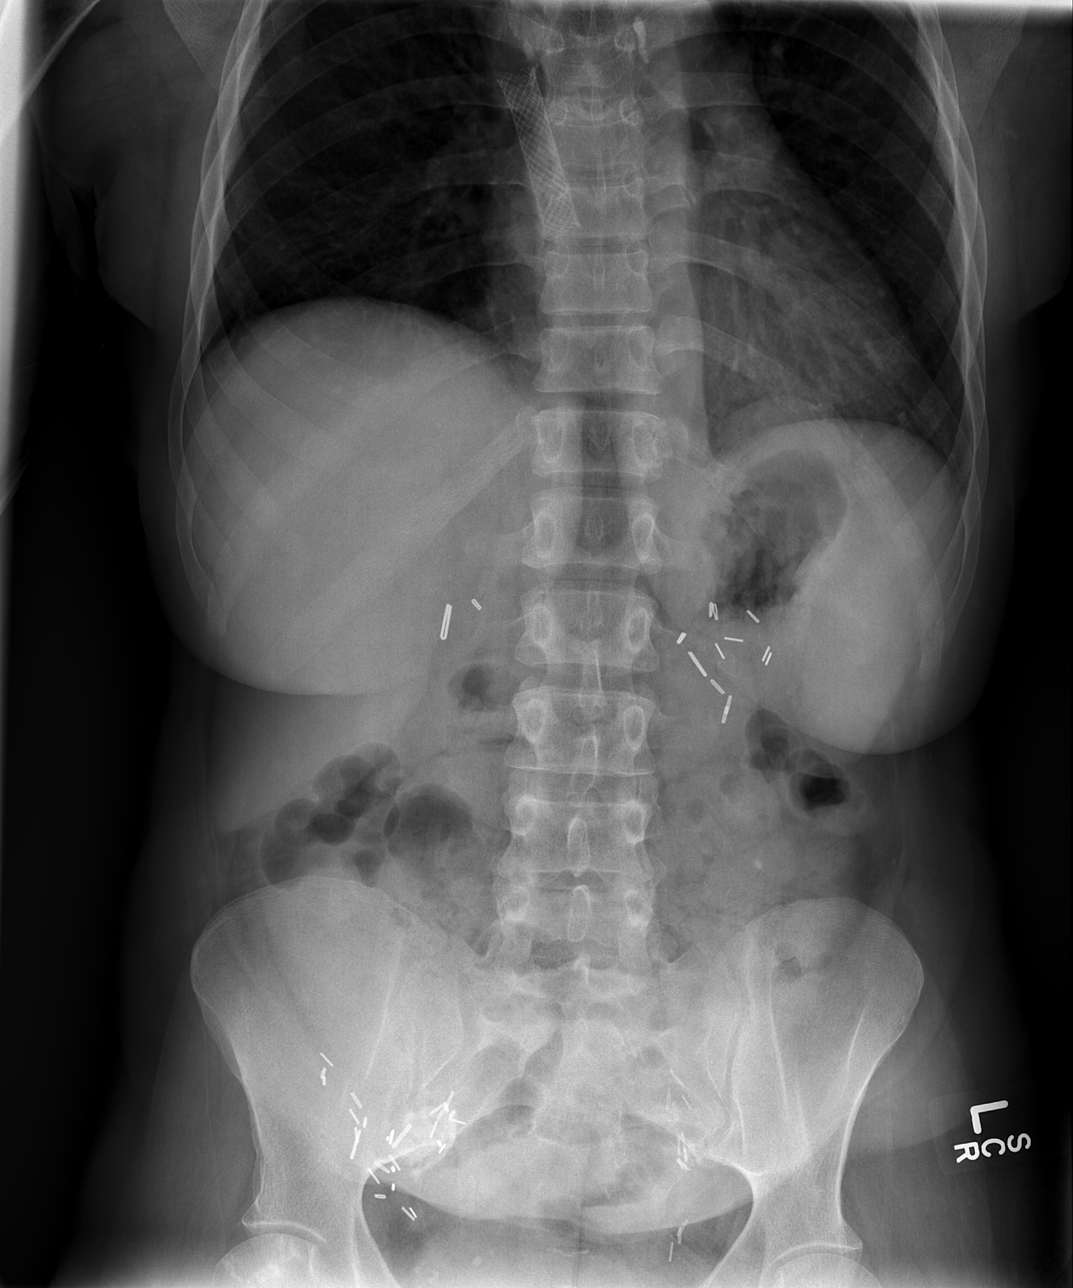

[t abdomen supine]
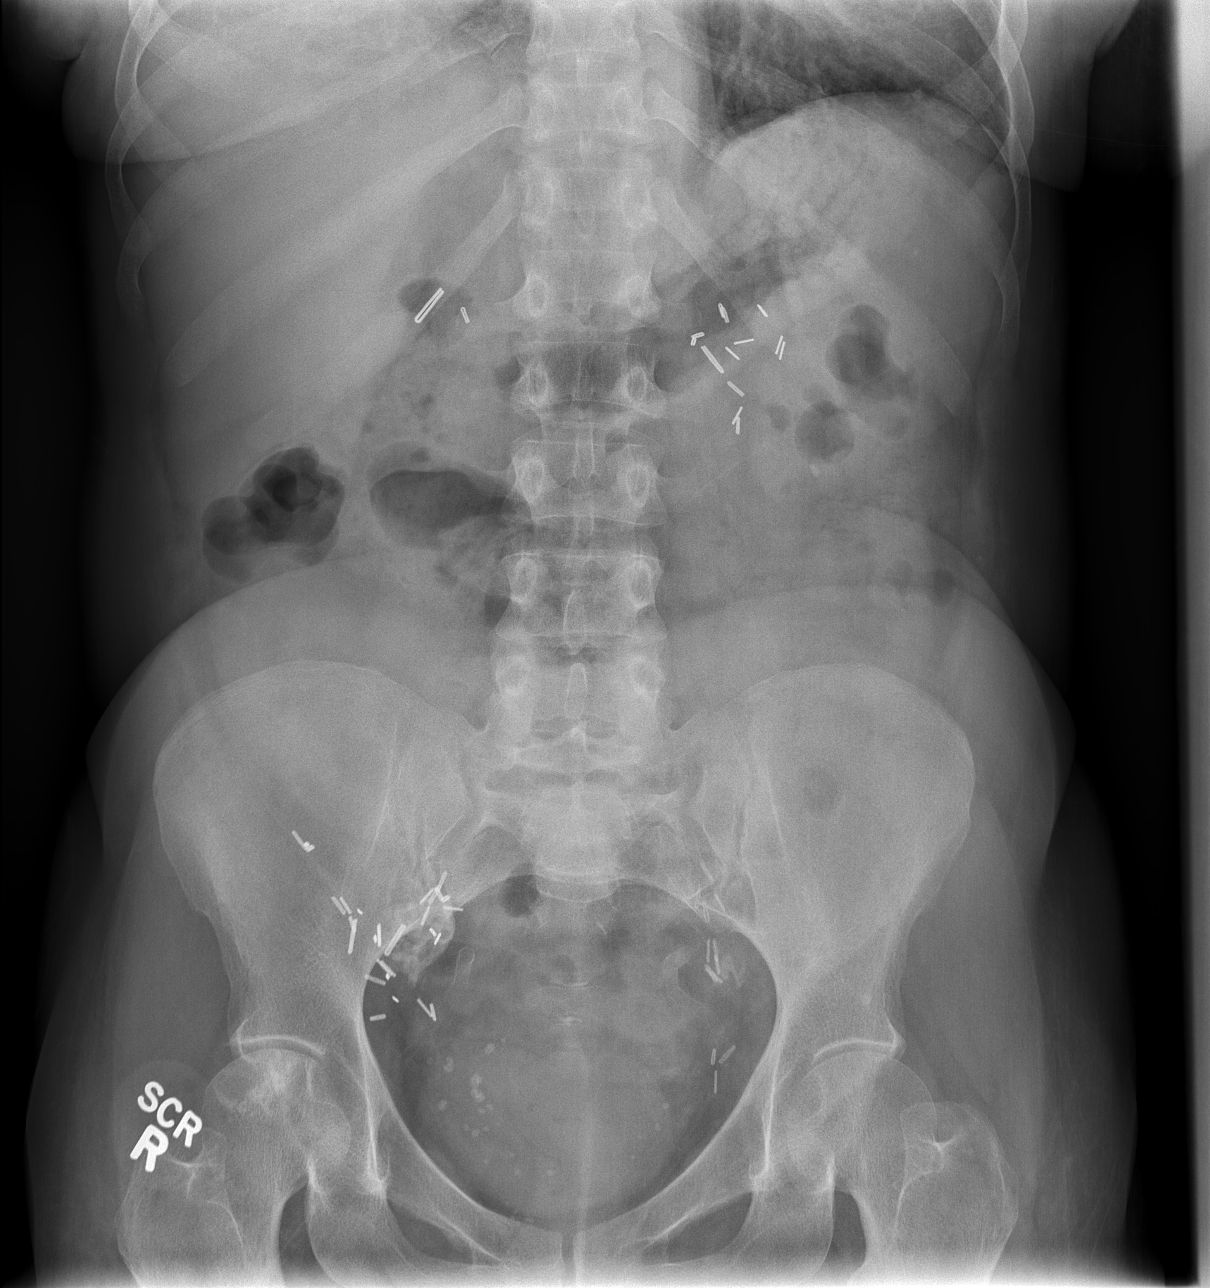

[3 of 3 positions shown; findings below may reference images not displayed]

FINDINGS: Frontal view of the chest demonstrates midline trachea.
Mild cardiomegaly.  Superior vena caval stent.  Right hemidiaphragm
elevation is mild. No pleural effusion or pneumothorax.  No
congestive failure.

Mild volume loss at the medial right lung base is similar.

Abominal films demonstrate no free intraperitoneal air or
significant air fluid levels on upright positioning.  Extensive
surgical changes about the upper abdomen and right greater than
left pelvis.

No bowel distention.  Numerous phleboliths in the pelvis.
Calcification over the left side of the abdomen is likely vascular.
Left kidney is absent.
  Avascular necrosis involves the right femoral head.
IMPRESSION: No acute findings.

## 2014-06-08 MED ORDER — EPOETIN ALFA 40000 UNIT/ML IJ SOLN
INTRAMUSCULAR | Status: AC
  Administered 2014-06-08: 40000 [IU] via SUBCUTANEOUS
  Filled 2014-06-08: qty 1

## 2014-06-08 MED ORDER — EPOETIN ALFA 20000 UNIT/ML IJ SOLN: 40000.0000 [IU] | INTRAMUSCULAR | Status: DC

## 2014-06-13 ENCOUNTER — Emergency Department (HOSPITAL_COMMUNITY): Payer: Medicaid Other

## 2014-06-13 ENCOUNTER — Inpatient Hospital Stay (HOSPITAL_COMMUNITY)
Admission: EM | Admit: 2014-06-13 | Discharge: 2014-06-16 | DRG: 872 | Disposition: A | Discharge status: Home / Self Care | Payer: Medicaid Other | Attending: Internal Medicine | Admitting: Internal Medicine

## 2014-06-13 ENCOUNTER — Encounter (HOSPITAL_COMMUNITY): Payer: Self-pay | Admitting: Emergency Medicine

## 2014-06-13 DIAGNOSIS — M109 Gout, unspecified: Secondary | ICD-10-CM | POA: Diagnosis present

## 2014-06-13 DIAGNOSIS — N289 Disorder of kidney and ureter, unspecified: Secondary | ICD-10-CM

## 2014-06-13 DIAGNOSIS — Z8711 Personal history of peptic ulcer disease: Secondary | ICD-10-CM

## 2014-06-13 DIAGNOSIS — Z9049 Acquired absence of other specified parts of digestive tract: Secondary | ICD-10-CM

## 2014-06-13 DIAGNOSIS — Z79891 Long term (current) use of opiate analgesic: Secondary | ICD-10-CM

## 2014-06-13 DIAGNOSIS — N10 Acute tubulo-interstitial nephritis: Secondary | ICD-10-CM | POA: Diagnosis present

## 2014-06-13 DIAGNOSIS — G8929 Other chronic pain: Secondary | ICD-10-CM | POA: Diagnosis present

## 2014-06-13 DIAGNOSIS — Z7982 Long term (current) use of aspirin: Secondary | ICD-10-CM | POA: Diagnosis not present

## 2014-06-13 DIAGNOSIS — N184 Chronic kidney disease, stage 4 (severe): Secondary | ICD-10-CM | POA: Diagnosis present

## 2014-06-13 DIAGNOSIS — M8580 Other specified disorders of bone density and structure, unspecified site: Secondary | ICD-10-CM | POA: Diagnosis present

## 2014-06-13 DIAGNOSIS — F17210 Nicotine dependence, cigarettes, uncomplicated: Secondary | ICD-10-CM | POA: Diagnosis present

## 2014-06-13 DIAGNOSIS — M5030 Other cervical disc degeneration, unspecified cervical region: Secondary | ICD-10-CM | POA: Diagnosis present

## 2014-06-13 DIAGNOSIS — I120 Hypertensive chronic kidney disease with stage 5 chronic kidney disease or end stage renal disease: Secondary | ICD-10-CM | POA: Diagnosis present

## 2014-06-13 DIAGNOSIS — N186 End stage renal disease: Secondary | ICD-10-CM | POA: Diagnosis present

## 2014-06-13 DIAGNOSIS — K3184 Gastroparesis: Secondary | ICD-10-CM | POA: Diagnosis present

## 2014-06-13 DIAGNOSIS — B009 Herpesviral infection, unspecified: Secondary | ICD-10-CM | POA: Diagnosis present

## 2014-06-13 DIAGNOSIS — N179 Acute kidney failure, unspecified: Secondary | ICD-10-CM | POA: Diagnosis present

## 2014-06-13 DIAGNOSIS — R509 Fever, unspecified: Secondary | ICD-10-CM | POA: Diagnosis not present

## 2014-06-13 DIAGNOSIS — G894 Chronic pain syndrome: Secondary | ICD-10-CM | POA: Diagnosis present

## 2014-06-13 DIAGNOSIS — Z881 Allergy status to other antibiotic agents status: Secondary | ICD-10-CM | POA: Diagnosis not present

## 2014-06-13 DIAGNOSIS — I129 Hypertensive chronic kidney disease with stage 1 through stage 4 chronic kidney disease, or unspecified chronic kidney disease: Secondary | ICD-10-CM | POA: Diagnosis present

## 2014-06-13 DIAGNOSIS — N189 Chronic kidney disease, unspecified: Secondary | ICD-10-CM

## 2014-06-13 DIAGNOSIS — A419 Sepsis, unspecified organism: Principal | ICD-10-CM | POA: Diagnosis present

## 2014-06-13 DIAGNOSIS — F329 Major depressive disorder, single episode, unspecified: Secondary | ICD-10-CM | POA: Diagnosis present

## 2014-06-13 DIAGNOSIS — D638 Anemia in other chronic diseases classified elsewhere: Secondary | ICD-10-CM | POA: Diagnosis present

## 2014-06-13 DIAGNOSIS — F1290 Cannabis use, unspecified, uncomplicated: Secondary | ICD-10-CM | POA: Diagnosis present

## 2014-06-13 DIAGNOSIS — Z940 Kidney transplant status: Secondary | ICD-10-CM | POA: Diagnosis not present

## 2014-06-13 DIAGNOSIS — M62838 Other muscle spasm: Secondary | ICD-10-CM | POA: Diagnosis present

## 2014-06-13 DIAGNOSIS — Z8673 Personal history of transient ischemic attack (TIA), and cerebral infarction without residual deficits: Secondary | ICD-10-CM

## 2014-06-13 DIAGNOSIS — K219 Gastro-esophageal reflux disease without esophagitis: Secondary | ICD-10-CM | POA: Diagnosis present

## 2014-06-13 DIAGNOSIS — D631 Anemia in chronic kidney disease: Secondary | ICD-10-CM | POA: Diagnosis present

## 2014-06-13 DIAGNOSIS — D649 Anemia, unspecified: Secondary | ICD-10-CM | POA: Diagnosis present

## 2014-06-13 DIAGNOSIS — K649 Unspecified hemorrhoids: Secondary | ICD-10-CM | POA: Diagnosis present

## 2014-06-13 DIAGNOSIS — N390 Urinary tract infection, site not specified: Secondary | ICD-10-CM | POA: Diagnosis not present

## 2014-06-13 LAB — COMPREHENSIVE METABOLIC PANEL
ALT: 9 U/L — ABNORMAL LOW (ref 14–54)
AST: 14 U/L — ABNORMAL LOW (ref 15–41)
Albumin: 3.8 g/dL (ref 3.5–5.0)
Alkaline Phosphatase: 64 U/L (ref 38–126)
Anion gap: 13 (ref 5–15)
BUN: 41 mg/dL — ABNORMAL HIGH (ref 6–20)
CO2: 20 mmol/L — ABNORMAL LOW (ref 22–32)
Calcium: 9.5 mg/dL (ref 8.9–10.3)
Chloride: 101 mmol/L (ref 101–111)
Creatinine, Ser: 3.01 mg/dL — ABNORMAL HIGH (ref 0.44–1.00)
GFR calc Af Amer: 20 mL/min — ABNORMAL LOW (ref >60)
GFR calc non Af Amer: 18 mL/min — ABNORMAL LOW (ref >60)
Glucose, Bld: 99 mg/dL (ref 70–99)
Potassium: 5 mmol/L (ref 3.5–5.1)
Sodium: 134 mmol/L — ABNORMAL LOW (ref 135–145)
Total Bilirubin: 0.9 mg/dL (ref 0.3–1.2)
Total Protein: 7.2 g/dL (ref 6.5–8.1)

## 2014-06-13 LAB — LIPASE, BLOOD: Lipase: 24 U/L (ref 22–51)

## 2014-06-13 LAB — URINALYSIS, ROUTINE W REFLEX MICROSCOPIC
Bilirubin Urine: NEGATIVE
Bilirubin Urine: NEGATIVE
Glucose, UA: NEGATIVE mg/dL
Glucose, UA: NEGATIVE mg/dL
Hgb urine dipstick: NEGATIVE
Hgb urine dipstick: NEGATIVE
Ketones, ur: 15 mg/dL — AB
Ketones, ur: 15 mg/dL — AB
Nitrite: POSITIVE — AB
Nitrite: POSITIVE — AB
Protein, ur: 100 mg/dL — AB
Protein, ur: 30 mg/dL — AB
Specific Gravity, Urine: 1.014 (ref 1.005–1.030)
Specific Gravity, Urine: 1.014 (ref 1.005–1.030)
Urobilinogen, UA: 0.2 mg/dL (ref 0.0–1.0)
Urobilinogen, UA: 0.2 mg/dL (ref 0.0–1.0)
pH: 7 (ref 5.0–8.0)
pH: 7.5 (ref 5.0–8.0)

## 2014-06-13 LAB — CBC WITH DIFFERENTIAL/PLATELET
Basophils Absolute: 0 10*3/uL (ref 0.0–0.1)
Basophils Relative: 0 % (ref 0–1)
Eosinophils Absolute: 0 10*3/uL (ref 0.0–0.7)
Eosinophils Relative: 0 % (ref 0–5)
HCT: 26.3 % — ABNORMAL LOW (ref 36.0–46.0)
Hemoglobin: 8.8 g/dL — ABNORMAL LOW (ref 12.0–15.0)
Lymphocytes Relative: 7 % — ABNORMAL LOW (ref 12–46)
Lymphs Abs: 0.3 10*3/uL — ABNORMAL LOW (ref 0.7–4.0)
MCH: 30.9 pg (ref 26.0–34.0)
MCHC: 33.5 g/dL (ref 30.0–36.0)
MCV: 92.3 fL (ref 78.0–100.0)
Monocytes Absolute: 0.1 10*3/uL (ref 0.1–1.0)
Monocytes Relative: 3 % (ref 3–12)
Neutro Abs: 4.6 10*3/uL (ref 1.7–7.7)
Neutrophils Relative %: 90 % — ABNORMAL HIGH (ref 43–77)
Platelets: 194 10*3/uL (ref 150–400)
RBC: 2.85 MIL/uL — ABNORMAL LOW (ref 3.87–5.11)
RDW: 20.8 % — ABNORMAL HIGH (ref 11.5–15.5)
WBC: 5.1 10*3/uL (ref 4.0–10.5)

## 2014-06-13 LAB — URINE MICROSCOPIC-ADD ON

## 2014-06-13 LAB — I-STAT CG4 LACTIC ACID, ED
Lactic Acid, Venous: 0.7 mmol/L (ref 0.5–2.0)
Lactic Acid, Venous: 0.72 mmol/L (ref 0.5–2.0)

## 2014-06-13 LAB — PROCALCITONIN: Procalcitonin: 0.82 ng/mL

## 2014-06-13 MED ORDER — IOHEXOL 300 MG/ML  SOLN
25.0000 mL | INTRAMUSCULAR | Status: AC
  Administered 2014-06-13 (×2): 25 mL via ORAL

## 2014-06-13 MED ORDER — VANCOMYCIN HCL IN DEXTROSE 750-5 MG/150ML-% IV SOLN
750.0000 mg | INTRAVENOUS | Status: DC
  Administered 2014-06-13: 750 mg via INTRAVENOUS
  Filled 2014-06-13 (×2): qty 150

## 2014-06-13 MED ORDER — PIPERACILLIN-TAZOBACTAM IN DEX 2-0.25 GM/50ML IV SOLN
2.2500 g | Freq: Three times a day (TID) | INTRAVENOUS | Status: DC
  Administered 2014-06-13 – 2014-06-15 (×6): 2.25 g via INTRAVENOUS
  Filled 2014-06-13 (×8): qty 50

## 2014-06-13 MED ORDER — METOCLOPRAMIDE HCL 5 MG PO TABS
5.0000 mg | ORAL_TABLET | Freq: Two times a day (BID) | ORAL | Status: DC
  Administered 2014-06-13 – 2014-06-16 (×7): 5 mg via ORAL
  Filled 2014-06-13 (×8): qty 1

## 2014-06-13 MED ORDER — OXYCODONE-ACETAMINOPHEN 5-325 MG PO TABS
1.0000 | ORAL_TABLET | Freq: Four times a day (QID) | ORAL | Status: DC | PRN
  Administered 2014-06-13 (×2): 2 via ORAL
  Filled 2014-06-13 (×3): qty 2

## 2014-06-13 MED ORDER — SODIUM CHLORIDE 0.9 % IV SOLN
INTRAVENOUS | Status: DC
  Administered 2014-06-13 – 2014-06-14 (×3): via INTRAVENOUS

## 2014-06-13 MED ORDER — MORPHINE SULFATE 4 MG/ML IJ SOLN
4.0000 mg | Freq: Once | INTRAMUSCULAR | Status: DC
  Filled 2014-06-13: qty 1

## 2014-06-13 MED ORDER — GABAPENTIN 300 MG PO CAPS
300.0000 mg | ORAL_CAPSULE | Freq: Every day | ORAL | Status: DC
  Administered 2014-06-13 – 2014-06-15 (×3): 300 mg via ORAL
  Filled 2014-06-13 (×4): qty 1

## 2014-06-13 MED ORDER — DICYCLOMINE HCL 20 MG PO TABS
20.0000 mg | ORAL_TABLET | Freq: Three times a day (TID) | ORAL | Status: DC | PRN
  Filled 2014-06-13 (×3): qty 1

## 2014-06-13 MED ORDER — PANTOPRAZOLE SODIUM 40 MG PO TBEC
80.0000 mg | DELAYED_RELEASE_TABLET | Freq: Every day | ORAL | Status: DC
  Administered 2014-06-13 – 2014-06-15 (×3): 80 mg via ORAL
  Filled 2014-06-13 (×2): qty 2

## 2014-06-13 MED ORDER — ONDANSETRON HCL 4 MG/2ML IJ SOLN
4.0000 mg | Freq: Four times a day (QID) | INTRAMUSCULAR | Status: DC | PRN
  Administered 2014-06-13 – 2014-06-15 (×2): 4 mg via INTRAVENOUS
  Filled 2014-06-13 (×2): qty 2

## 2014-06-13 MED ORDER — ASPIRIN EC 81 MG PO TBEC
81.0000 mg | DELAYED_RELEASE_TABLET | Freq: Every day | ORAL | Status: DC
  Administered 2014-06-13 – 2014-06-15 (×3): 81 mg via ORAL
  Filled 2014-06-13 (×3): qty 1

## 2014-06-13 MED ORDER — ONDANSETRON HCL 4 MG PO TABS: 4.0000 mg | ORAL_TABLET | Freq: Four times a day (QID) | ORAL | Status: DC | PRN

## 2014-06-13 MED ORDER — ACETAMINOPHEN 325 MG PO TABS: 650.0000 mg | ORAL_TABLET | Freq: Four times a day (QID) | ORAL | Status: DC | PRN

## 2014-06-13 MED ORDER — SODIUM CHLORIDE 0.9 % IJ SOLN
3.0000 mL | Freq: Two times a day (BID) | INTRAMUSCULAR | Status: DC
  Administered 2014-06-13 – 2014-06-16 (×6): 3 mL via INTRAVENOUS

## 2014-06-13 MED ORDER — MORPHINE SULFATE 2 MG/ML IJ SOLN
2.0000 mg | Freq: Once | INTRAMUSCULAR | Status: AC
  Administered 2014-06-13: 2 mg via INTRAVENOUS
  Filled 2014-06-13: qty 1

## 2014-06-13 MED ORDER — AZATHIOPRINE 50 MG PO TABS
100.0000 mg | ORAL_TABLET | Freq: Every day | ORAL | Status: DC
  Administered 2014-06-13 – 2014-06-16 (×4): 100 mg via ORAL
  Filled 2014-06-13 (×4): qty 2

## 2014-06-13 MED ORDER — SODIUM CHLORIDE 0.9 % IV BOLUS (SEPSIS)
2000.0000 mL | Freq: Once | INTRAVENOUS | Status: AC
  Administered 2014-06-13: 2000 mL via INTRAVENOUS

## 2014-06-13 MED ORDER — TACROLIMUS 1 MG PO CAPS
3.0000 mg | ORAL_CAPSULE | Freq: Two times a day (BID) | ORAL | Status: DC
  Administered 2014-06-13 – 2014-06-16 (×7): 3 mg via ORAL
  Filled 2014-06-13 (×8): qty 3

## 2014-06-13 MED ORDER — DEXTROSE 5 % IV SOLN
1.0000 g | Freq: Once | INTRAVENOUS | Status: AC
  Administered 2014-06-13: 1 g via INTRAVENOUS
  Filled 2014-06-13: qty 10

## 2014-06-13 MED ORDER — PROMETHAZINE HCL 25 MG/ML IJ SOLN: 12.5000 mg | Freq: Once | INTRAMUSCULAR | Status: DC

## 2014-06-13 MED ORDER — ACETAMINOPHEN 650 MG RE SUPP: 650.0000 mg | Freq: Four times a day (QID) | RECTAL | Status: DC | PRN

## 2014-06-13 MED ORDER — HEPARIN SODIUM (PORCINE) 5000 UNIT/ML IJ SOLN
5000.0000 [IU] | Freq: Three times a day (TID) | INTRAMUSCULAR | Status: DC
  Administered 2014-06-13 – 2014-06-15 (×6): 5000 [IU] via SUBCUTANEOUS
  Filled 2014-06-13 (×6): qty 1

## 2014-06-13 MED ORDER — FENTANYL CITRATE (PF) 100 MCG/2ML IJ SOLN
50.0000 ug | Freq: Once | INTRAMUSCULAR | Status: AC
  Administered 2014-06-13: 50 ug via INTRAVENOUS
  Filled 2014-06-13: qty 2

## 2014-06-13 MED ORDER — METHYLPREDNISOLONE SODIUM SUCC 40 MG IJ SOLR
40.0000 mg | Freq: Every day | INTRAMUSCULAR | Status: DC
  Administered 2014-06-13 – 2014-06-15 (×3): 40 mg via INTRAVENOUS
  Filled 2014-06-13 (×3): qty 1

## 2014-06-13 NOTE — ED Notes (Signed)
Pt wondering if she should take her missed dose now and then again in the morning or just wait until the morning to take her medication.

## 2014-06-13 NOTE — ED Provider Notes (Signed)
CSN: [id number]     Arrival date & time 06/13/14  0006 History   First MD Initiated Contact with Patient 06/13/14 0356     Chief Complaint  Patient presents with  . Abdominal Pain  . Back Pain     (Consider location/radiation/quality/duration/timing/severity/associated sxs/prior Treatment) HPI 49 year old female presents to the emergency department from home with complaint of "my kidneys hurting".  Patient reports that she has been feeling unwell for the last 2 weeks.  She reports initially she had fatigue and poor appetite.  She tried to increase her fluid intake and started using boost.  Patient reports after starting boost, she developed diarrhea.  Patient reports she noted a fever of 102 on Friday.  On Saturday she had clear vaginal discharge area.  This has resolved.  She reports starting on Monday she developed right lower abdominal pain that radiated into her groin.  She reports burning with urination and frequent urination.  Patient has complicated past medical history of CVA, end-stage renal disease, status post multiple kidney transplants, gastroparesis, peptic ulcer disease.  Patient has not seen her doctor recently for evaluation of the symptoms.  She denies any fever since Friday.  She reports nausea but no vomiting.  She reports diarrhea has resolved.   Past Medical History  Diagnosis Date  . Gastroparesis   . Gout   . Hypotension   . Herpes genitalia 1994  . E coli bacteremia 06/18/2011  . Bacteremia due to Gram-negative bacteria 05/23/2011  . History of blood transfusion     "more than a few times" (12/11/2011)  . History of stomach ulcers   . Stroke      left basal ganglia lacunar infarct; Right frontal lobe lacunar infarct.  . Stroke ~ 1999; 2001    "briefly lost my vision; lost my right eye" (12/11/2011)  . Chronic lower back pain   . Depression   . Osteopenia   . Headache(784.0)     "not often anymore" (12/11/2011)  . Complication of anesthesia     "woke up during OR;  I have an extremely high tolerance" (12/11/2011)  . Dysrhythmia     "tachycardia" (12/11/2011)  . ESRD (end stage renal disease) 06/12/2011  . Iron deficiency anemia   . Seizures 1994    "post transplant; only have had that one" (12/11/2011)  . GERD (gastroesophageal reflux disease)   . DDD (degenerative disc disease), cervical    Past Surgical History  Procedure Laterality Date  . Kidney transplant  1994; 1999; 2005    "right"  . Appendectomy  ~ 2004  . 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 Total nephrectomy  1988?; 1994; 2005  . Multiple tooth extractions    . Enucleation  2001    "right"  . Esophagogastroduodenoscopy (egd) with propofol N/A 04/21/2012    Procedure: ESOPHAGOGASTRODUODENOSCOPY (EGD) WITH PROPOFOL;  Surgeon: Milus Banister, MD;  Location: [facility];  Service: Endoscopy;  Laterality: N/A;   Family History  Problem Relation Age of Onset  . Hypertension Maternal Grandmother    History  Substance Use Topics  . Smoking status: Current Some Day Smoker -- 0.50 packs/day for 28 years    Types: Cigarettes  . Smokeless tobacco: Never Used     Comment: had quit 03/05/2011  . Alcohol Use: Yes     Comment: uses daily   OB History    No data available  Review of Systems  See History of Present Illness; otherwise all other systems are reviewed and negative   Allergies  Levofloxacin  Home Medications   Prior to Admission medications   Medication Sig Start Date End Date Taking? Authorizing Provider  aspirin EC 81 MG tablet Take 81 mg by mouth daily.   Yes Historical Provider, MD  azaTHIOprine (IMURAN) 50 MG tablet Take 100 mg by mouth daily.    Yes Historical Provider, MD  colchicine 0.6 MG tablet Take 0.6 mg by mouth daily as needed (For gout.).   Yes Historical Provider, MD  Darbepoetin Alfa-Albumin (ARANESP IJ) Inject as directed every 30 (thirty) days.    Yes Historical Provider, MD   dicyclomine (BENTYL) 20 MG tablet Take 1 tablet (20 mg total) by mouth 4 (four) times daily -  before meals and at bedtime. Patient taking differently: Take 20 mg by mouth 3 (three) times daily as needed.  01/25/14  Yes Orlie Dakin, MD  furosemide (LASIX) 40 MG tablet Take 40 mg by mouth every evening.  06/22/11  Yes [name], MD  gabapentin (NEURONTIN) 300 MG capsule Take 300 mg by mouth at bedtime.    Yes Historical Provider, MD  HYDROcodone-acetaminophen (NORCO) 10-325 MG per tablet Take 1 tablet by mouth every 6 (six) hours as needed for moderate pain.  01/09/14  Yes Historical Provider, MD  metoCLOPramide (REGLAN) 5 MG tablet TAKE 1 TABLET (5 MG TOTAL) BY MOUTH AT BEDTIME AND MAY REPEAT DOSE ONE TIME IF NEEDED. Patient taking differently: Take 5 mg by mouth 2 (two) times daily. Can take more doses prn. 09/13/13  Yes Milus Banister, MD  Multiple Vitamin (MULTIVITAMIN) tablet Take 1 tablet by mouth daily.   Yes Historical Provider, MD  nystatin (MYCOSTATIN) 100000 UNIT/ML suspension Take 5 mLs (500,000 Units total) by mouth 4 (four) times daily. Patient taking differently: Take 5 mLs by mouth 4 (four) times daily as needed.  09/13/13  Yes Milus Banister, MD  omeprazole (PRILOSEC) 40 MG capsule Take 40 mg by mouth 2 (two) times daily.    Yes Historical Provider, MD  ondansetron (ZOFRAN-ODT) 4 MG disintegrating tablet Take 1 tablet (4 mg total) by mouth every 6 (six) hours. Patient taking differently: Take 4 mg by mouth every 6 (six) hours as needed for nausea or vomiting.  05/18/13  Yes Amy S Esterwood, PA-C  oxyCODONE (ROXICODONE) 5 MG immediate release tablet Take 1 tab daily as needed for pain. Patient taking differently: Take 5 mg by mouth daily as needed (pain).  05/18/13  Yes Amy S Esterwood, PA-C  oxyCODONE-acetaminophen (PERCOCET/ROXICET) 5-325 MG per tablet Take 1-2 tablets by mouth every 6 (six) hours as needed for severe pain. 04/06/14  Yes Heather Laisure, PA-C  predniSONE  (DELTASONE) 5 MG tablet Take 5 mg by mouth daily. 06/22/11  Yes [name], MD  promethazine (PHENERGAN) 25 MG tablet Take 1 tablet (25 mg total) by mouth every 6 (six) hours as needed for nausea or vomiting. 05/07/13  Yes Margarita Mail, PA-C  tacrolimus (PROGRAF) 1 MG capsule Take 3 mg by mouth 2 (two) times daily.  06/22/11  Yes [name], MD  triamcinolone cream (KENALOG) 0.1 % Apply 1 application topically as needed.  02/20/14  Yes Historical Provider, MD  methocarbamol (ROBAXIN) 500 MG tablet Take 1 tablet (500 mg total) by mouth 2 (two) times daily. Patient not taking: Reported on 05/18/2014 04/06/14   Hyman Bible, PA-C  promethazine (PHENERGAN) 25 MG suppository Place 1  suppository (25 mg total) rectally every 6 (six) hours as needed for nausea or vomiting. Patient not taking: Reported on 06/13/2014 05/18/13   Amy S Esterwood, PA-C   BP 97/59 mmHg  Pulse 62  Temp(Src) 98.9 F (37.2 C) (Rectal)  Resp 16  Ht 4\' 10"  (1.473 m)  Wt 112 lb (50.803 kg)  BMI 23.41 kg/m2  SpO2 100% Physical Exam  Constitutional: She is oriented to person, place, and time. She appears well-developed and well-nourished.  HENT:  Head: Normocephalic and atraumatic.  Nose: Nose normal.  Dry mucous membranes  Eyes: Conjunctivae and EOM are normal. Pupils are equal, round, and reactive to light.  Neck: Normal range of motion. Neck supple. No JVD present. No tracheal deviation present. No thyromegaly present.  Cardiovascular: Normal rate, regular rhythm, normal heart sounds and intact distal pulses.  Exam reveals no gallop and no friction rub.   No murmur heard. Pulmonary/Chest: Effort normal and breath sounds normal. No stridor. No respiratory distress. She has no wheezes. She has no rales. She exhibits no tenderness.  Abdominal: Soft. Bowel sounds are normal. She exhibits no distension and no mass. There is no tenderness. There is no rebound and no guarding.  Patient has diffuse tenderness to  pain, worse in right lower quadrant.  No rebound or guarding.  Musculoskeletal: Normal range of motion. She exhibits no edema or tenderness.  Lymphadenopathy:    She has no cervical adenopathy.  Neurological: She is alert and oriented to person, place, and time. She displays normal reflexes. She exhibits normal muscle tone. Coordination normal.  Skin: Skin is warm and dry. No rash noted. No erythema. No pallor.  Psychiatric: She has a normal mood and affect. Her behavior is normal. Judgment and thought content normal.  Nursing note and vitals reviewed.   ED Course  Procedures (including critical care time) Labs Review Labs Reviewed  CBC WITH DIFFERENTIAL/PLATELET - Abnormal; Notable for the following:    RBC 2.85 (*)    Hemoglobin 8.8 (*)    HCT 26.3 (*)    RDW 20.8 (*)    Neutrophils Relative % 90 (*)    Lymphocytes Relative 7 (*)    Lymphs Abs 0.3 (*)    All other components within normal limits  COMPREHENSIVE METABOLIC PANEL - Abnormal; Notable for the following:    Sodium 134 (*)    CO2 20 (*)    BUN 41 (*)    Creatinine, Ser 3.01 (*)    AST 14 (*)    ALT 9 (*)    GFR calc non Af Amer 18 (*)    GFR calc Af Amer 20 (*)    All other components within normal limits  URINALYSIS, ROUTINE W REFLEX MICROSCOPIC - Abnormal; Notable for the following:    Color, Urine AMBER (*)    APPearance CLOUDY (*)    Ketones, ur 15 (*)    Protein, ur 100 (*)    Nitrite POSITIVE (*)    Leukocytes, UA MODERATE (*)    All other components within normal limits  URINE MICROSCOPIC-ADD ON - Abnormal; Notable for the following:    Squamous Epithelial / LPF MANY (*)    Bacteria, UA MANY (*)    All other components within normal limits  URINALYSIS, ROUTINE W REFLEX MICROSCOPIC - Abnormal; Notable for the following:    APPearance CLOUDY (*)    Ketones, ur 15 (*)    Protein, ur 30 (*)    Nitrite POSITIVE (*)    Leukocytes, UA  SMALL (*)    All other components within normal limits  URINE  MICROSCOPIC-ADD ON - Abnormal; Notable for the following:    Squamous Epithelial / LPF FEW (*)    Bacteria, UA MANY (*)    Casts HYALINE CASTS (*)    All other components within normal limits  URINE CULTURE  LIPASE, BLOOD  I-STAT CG4 LACTIC ACID, ED  I-STAT CG4 LACTIC ACID, ED    Imaging Review Ct Abdomen Pelvis Wo Contrast  06/13/2014   CLINICAL DATA:  49 year old female with right flank and lower quadrant pain nausea and vomiting. Initial encounter. Current history of renal transplant.  EXAM: CT ABDOMEN AND PELVIS WITHOUT CONTRAST  TECHNIQUE: Multidetector CT imaging of the abdomen and pelvis was performed following the standard protocol without IV contrast.  COMPARISON:  Acute abdominal series [id number]. CT Abdomen and Pelvis 12/08/2011.  FINDINGS: Stable lung bases with middle lobe and lower lobe scarring on the right. Small left lower lobe calcified granulomas. No pericardial or pleural effusion.  No acute osseous abnormality identified.  Right femoral head AVN.  Right lower quadrant renal transplant is stable in size and configuration. No transplant hydronephrosis or hydroureter. Diminutive bladder. Fairly extensive calcified atherosclerosis, worst in the pelvis and proximal lower extremities than in the abdomen. Chronic retroperitoneal venous collaterals which communicate with the azygos and hemi azygous system appear to be in response to diminutive IVC at the native renal level.  No pelvic free fluid. Pelvic vascular congestion/ mild stranding appears stable. Stable noncontrast appearance of the uterus. At adnexa might be surgically absent. Dense soft tissue calcification along the course of the right external iliac artery is unchanged, multiple surgical clips in the area. This might be a chronic vascular finding related to the transplant surgical changes.  Decompressed rectum and sigmoid. Oral contrast in the left colon. Contrast throughout the transverse and right colon. The cecum appears to be  located in the midline. Negative terminal ileum. Appendix not identified. No pericecal inflammation. No dilated small bowel. Negative stomach and duodenum.  Native kidneys are surgically absent as before. Noncontrast liver, gallbladder, spleen, pancreas and adrenal glands are within normal limits. No abdominal free fluid. No definite lymphadenopathy.  IMPRESSION: 1. Stable CT appearance of the abdomen and pelvis since 2013. Stable CT appearance of the right lower quadrant renal transplant, without transplant hydronephrosis. Native kidneys surgically absent as before. 2. No acute or inflammatory process identified.   Electronically Signed   By: Genevie Ann M.D.   On: 06/13/2014 07:36     EKG Interpretation None      MDM   Final diagnoses:  Urinary tract infection without hematuria, site unspecified  History of renal transplant  Renal insufficiency    49 year old female status post renal transplant with reported normal creatinines mid twos who presents with 2 weeks of malaise, poor appetite 3 days of right lower abdominal pain, back pain.  Patient has had low blood pressures here in the emergency department, but no fevers, and lactate is normal.  Initially urinalysis looked contaminated.  Cath UA does show signs of infection.  Plan for CT scan with oral contrast only for further evaluation of abdominal pain.  Will treat with Rocephin.  Patient will need admission to hospital.   Linton Flemings, MD 06/13/14 [phone]

## 2014-06-13 NOTE — H&P (Signed)
History and Physical  Tina Watkins [id number] DOB: 1965-02-10 DOA: 06/13/2014   PCP: [name], MD   Chief Complaint: abdominal pain, fever  HPI:  49 year old female with a history of renal transplant in July 2005, gastroparesis, stroke, chronic pain with chronic opioid use presented with two-week history of generalized weakness and myalgias. The patient states that she has had lower abdominal pain for the past 2 days with associated fevers up to 102.66F. The patient normally has chronic neck pain from degenerative disc disease which radiates to her right arm and shoulder, but she states that this is not worse than usual. She denies any headaches or visual disturbance. She denies any coughing or hemoptysis. She has abdominal pain which she states is worse than usual in the lower quadrant. In addition, the patient has had progressive generalized weakness the point where she is having difficulty getting out of bed. As a result, she can get emergency department for further evaluation. She denies any diarrhea, hematochezia, melena. She denies any dysuria, but complains of some urinary urgency. She denies any unusual rashes, chest discomfort.   In the emergency department, temperature was noted to be 99.5F with blood pressure 84/54. Oxygen saturation was 99-100 percent on room air. Sodium was 134 with potassium 5.0, serum creatinine 3.01 WBC was 5.1 with hemoglobin 8.8 and platelets 194,000. Lactic acid 0.70. Urinalysis showed 11-20 WBCs.   hepatic enzymes and lipase unremarkable.  Assessment/Plan: sepsis  -Present at the time of admission  -Likely urinary source--concerned about transplant kidney pyelonephritis, although the patient's exam is somewhat confounded by the patient's chronic pain  -Given the patient's immunosuppression with Imuran and Prograf and history of bacteremia, start patient on broad spectrum antibiotics including Zosyn and vancomycin pending culture data    -Blood cultures 2 sets--please note that these were obtained after the patient received ceftriaxone  -CT abdomen and pelvis revealed RLQ renal transfer without hydronephrosis; stable pelvic vascular congestion; no other acute findings -Initial lactic acid 0.70  -Trend lactic acid and procalcitonin  -Start patient on stress steroids--start Solu-Medrol 40 mg IV daily and wean as indicated -continue IVF--patient already received 2 L in the emergency department 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) -Due to volume depletion and sepsis -Baseline creatinine 2.2-2.4  -If no improvement with hydration and treatment of infectious process, may need to consider transplant rejection UTI/pyelonephritis -Broad spectrum antibiotics as discussed above -CT abdomen and pelvis negative for abscess GERD/gastroparesis/chronic abdominal pain -Continue Reglan, PPI, Bentyl Hypotension -Patient states that she chronically running low blood pressures with systolics in the upper [id number] and low 90s; however, I am concerned given the patient's current presentation she has superimposed sepsis -Certainly, her hypertension may be compounded by the patient's intravenous opioids History of renal transplant -Performed in July 2005 at [place] and Prograf  History of stroke  -Continue aspirin 81 mg daily       Past Medical History  Diagnosis Date  . Gastroparesis   . Gout   . Hypotension   . Herpes genitalia 1994  . E coli bacteremia 06/18/2011  . Bacteremia due to Gram-negative bacteria 05/23/2011  . History of blood transfusion     "more than a few times" (12/11/2011)  . History of stomach ulcers   . Stroke      left basal ganglia lacunar infarct; Right frontal lobe lacunar infarct.  . Stroke ~ 1999; 2001    "briefly lost my vision; lost my right  eye" (12/11/2011)  . Chronic lower back pain   . Depression   . Osteopenia   . Headache(784.0)     "not often  anymore" (12/11/2011)  . Complication of anesthesia     "woke up during OR; I have an extremely high tolerance" (12/11/2011)  . Dysrhythmia     "tachycardia" (12/11/2011)  . ESRD (end stage renal disease) 06/12/2011  . Iron deficiency anemia   . Seizures 1994    "post transplant; only have had that one" (12/11/2011)  . GERD (gastroesophageal reflux disease)   . DDD (degenerative disc disease), cervical    Past Surgical History  Procedure Laterality Date  . Kidney transplant  1994; 1999; 2005    "right"  . Appendectomy  ~ 2004  . 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 Total nephrectomy  1988?; 1994; 2005  . Multiple tooth extractions    . Enucleation  2001    "right"  . Esophagogastroduodenoscopy (egd) with propofol N/A 04/21/2012    Procedure: ESOPHAGOGASTRODUODENOSCOPY (EGD) WITH PROPOFOL;  Surgeon: Milus Banister, MD;  Location: [facility];  Service: Endoscopy;  Laterality: N/A;   Social History:  reports that she has been smoking Cigarettes.  She has a 14 pack-year smoking history. She has never used smokeless tobacco. She reports that she drinks alcohol. She reports that she uses illicit drugs (Marijuana).   Family History  Problem Relation Age of Onset  . Hypertension Maternal Grandmother      Allergies  Allergen Reactions  . Levofloxacin Itching and Rash      Prior to Admission medications   Medication Sig Start Date End Date Taking? Authorizing Provider  aspirin EC 81 MG tablet Take 81 mg by mouth daily.   Yes Historical Provider, MD  azaTHIOprine (IMURAN) 50 MG tablet Take 100 mg by mouth daily.    Yes Historical Provider, MD  colchicine 0.6 MG tablet Take 0.6 mg by mouth daily as needed (For gout.).   Yes Historical Provider, MD  Darbepoetin Alfa-Albumin (ARANESP IJ) Inject as directed every 30 (thirty) days.    Yes Historical Provider, MD  dicyclomine (BENTYL) 20 MG tablet Take 1 tablet (20 mg  total) by mouth 4 (four) times daily -  before meals and at bedtime. Patient taking differently: Take 20 mg by mouth 3 (three) times daily as needed.  01/25/14  Yes Orlie Dakin, MD  furosemide (LASIX) 40 MG tablet Take 40 mg by mouth every evening.  06/22/11  Yes [name], MD  gabapentin (NEURONTIN) 300 MG capsule Take 300 mg by mouth at bedtime.    Yes Historical Provider, MD  HYDROcodone-acetaminophen (NORCO) 10-325 MG per tablet Take 1 tablet by mouth every 6 (six) hours as needed for moderate pain.  01/09/14  Yes Historical Provider, MD  metoCLOPramide (REGLAN) 5 MG tablet TAKE 1 TABLET (5 MG TOTAL) BY MOUTH AT BEDTIME AND MAY REPEAT DOSE ONE TIME IF NEEDED. Patient taking differently: Take 5 mg by mouth 2 (two) times daily. Can take more doses prn. 09/13/13  Yes Milus Banister, MD  Multiple Vitamin (MULTIVITAMIN) tablet Take 1 tablet by mouth daily.   Yes Historical Provider, MD  nystatin (MYCOSTATIN) 100000 UNIT/ML suspension Take 5 mLs (500,000 Units total) by mouth 4 (four) times daily. Patient taking differently: Take 5 mLs by mouth 4 (four) times daily as needed.  09/13/13  Yes Milus Banister, MD  omeprazole [facility])  40 MG capsule Take 40 mg by mouth 2 (two) times daily.    Yes Historical Provider, MD  ondansetron (ZOFRAN-ODT) 4 MG disintegrating tablet Take 1 tablet (4 mg total) by mouth every 6 (six) hours. Patient taking differently: Take 4 mg by mouth every 6 (six) hours as needed for nausea or vomiting.  05/18/13  Yes Amy S Esterwood, PA-C  oxyCODONE (ROXICODONE) 5 MG immediate release tablet Take 1 tab daily as needed for pain. Patient taking differently: Take 5 mg by mouth daily as needed (pain).  05/18/13  Yes Amy S Esterwood, PA-C  oxyCODONE-acetaminophen (PERCOCET/ROXICET) 5-325 MG per tablet Take 1-2 tablets by mouth every 6 (six) hours as needed for severe pain. 04/06/14  Yes Heather Laisure, PA-C  predniSONE (DELTASONE) 5 MG tablet Take 5 mg by mouth daily. 06/22/11   Yes [name], MD  promethazine (PHENERGAN) 25 MG tablet Take 1 tablet (25 mg total) by mouth every 6 (six) hours as needed for nausea or vomiting. 05/07/13  Yes Margarita Mail, PA-C  tacrolimus (PROGRAF) 1 MG capsule Take 3 mg by mouth 2 (two) times daily.  06/22/11  Yes [name], MD  triamcinolone cream (KENALOG) 0.1 % Apply 1 application topically as needed.  02/20/14  Yes Historical Provider, MD  methocarbamol (ROBAXIN) 500 MG tablet Take 1 tablet (500 mg total) by mouth 2 (two) times daily. Patient not taking: Reported on 05/18/2014 04/06/14   Hyman Bible, PA-C  promethazine (PHENERGAN) 25 MG suppository Place 1 suppository (25 mg total) rectally every 6 (six) hours as needed for nausea or vomiting. Patient not taking: Reported on 06/13/2014 05/18/13   Amy S Esterwood, PA-C    Review of Systems:  Constitutional:  No weight loss, night sweats, Fevers, chills, fatigue.  Head&Eyes: No headache.  No vision loss.  No eye pain or scotoma ENT:  No Difficulty swallowing,Tooth/dental problems,Sore throat,   Cardio-vascular:  No chest pain, Orthopnea, PND, swelling in lower extremities,  dizziness, palpitations  GI:  No  abdominal pain, nausea, vomiting, diarrhea, loss of appetite, hematochezia, melena, heartburn, indigestion, Resp:  No shortness of breath with exertion or at rest. No cough. No coughing up of blood .No wheezing.No chest wall deformity  Skin:  no rash or lesions.  GU:  no dysuria, change in color of urine, . No flank pain.  Musculoskeletal:  No joint pain or swelling. No decreased range of motion. chronic neck pain Psych:  No change in mood or affect. No depression or anxiety. Neurologic: No headache, no dysesthesia, no focal weakness, no vision loss. No syncope  Physical Exam: Filed Vitals:   06/13/14 [id number] 06/13/14 0645 06/13/14 0745 06/13/14 0830  BP: 111/60 106/62 85/47 96/54   Pulse: 64   65  Temp:      TempSrc:      Resp: 18 19 18 20   Height:       Weight:      SpO2: 96%   100%   General:  A&O x 3, NAD, nontoxic, pleasant/cooperative Head/Eye: No conjunctival hemorrhage, no icterus, [place]/AT, No nystagmus ENT:  No icterus,  No thrush, poor dentition, no pharyngeal exudate Neck:  No masses, no lymphadenpathy, no bruits CV:  RRR, no rub, no gallop, no S3 Lung:  CTAB, good air movement, no wheeze, no rhonchi Abdomen: soft/NT, +BS, nondistended, no peritoneal signs Ext: No cyanosis, No rashes, No petechiae, No lymphangitis, trace LE edema   Labs on Admission:  Basic Metabolic Panel:  Recent Labs Lab 06/08/14 0955 06/13/14 0035  NA 138 134*  K 4.5 5.0  CL 103 101  CO2 24 20*  GLUCOSE 94 99  BUN 43* 41*  CREATININE 2.74* 3.01*  CALCIUM 10.1 9.5  PHOS 4.0  --    Liver Function Tests:  Recent Labs Lab 06/08/14 0955 06/13/14 0035  AST  --  14*  ALT  --  9*  ALKPHOS  --  64  BILITOT  --  0.9  PROT  --  7.2  ALBUMIN 4.3 3.8    Recent Labs Lab 06/13/14 0035  LIPASE 24   No results for input(s): AMMONIA in the last 168 hours. CBC:  Recent Labs Lab 06/08/14 0955 06/13/14 0035  WBC  --  5.1  NEUTROABS  --  4.6  HGB 10.4* 8.8*  HCT  --  26.3*  MCV  --  92.3  PLT  --  194   Cardiac Enzymes: No results for input(s): CKTOTAL, CKMB, CKMBINDEX, TROPONINI in the last 168 hours. BNP: Invalid input(s): POCBNP CBG: No results for input(s): GLUCAP in the last 168 hours.  Radiological Exams on Admission: Ct Abdomen Pelvis Wo Contrast  06/13/2014   CLINICAL DATA:  49 year old female with right flank and lower quadrant pain nausea and vomiting. Initial encounter. Current history of renal transplant.  EXAM: CT ABDOMEN AND PELVIS WITHOUT CONTRAST  TECHNIQUE: Multidetector CT imaging of the abdomen and pelvis was performed following the standard protocol without IV contrast.  COMPARISON:  Acute abdominal series [id number]. CT Abdomen and Pelvis 12/08/2011.  FINDINGS: Stable lung bases with middle lobe and lower lobe  scarring on the right. Small left lower lobe calcified granulomas. No pericardial or pleural effusion.  No acute osseous abnormality identified.  Right femoral head AVN.  Right lower quadrant renal transplant is stable in size and configuration. No transplant hydronephrosis or hydroureter. Diminutive bladder. Fairly extensive calcified atherosclerosis, worst in the pelvis and proximal lower extremities than in the abdomen. Chronic retroperitoneal venous collaterals which communicate with the azygos and hemi azygous system appear to be in response to diminutive IVC at the native renal level.  No pelvic free fluid. Pelvic vascular congestion/ mild stranding appears stable. Stable noncontrast appearance of the uterus. At adnexa might be surgically absent. Dense soft tissue calcification along the course of the right external iliac artery is unchanged, multiple surgical clips in the area. This might be a chronic vascular finding related to the transplant surgical changes.  Decompressed rectum and sigmoid. Oral contrast in the left colon. Contrast throughout the transverse and right colon. The cecum appears to be located in the midline. Negative terminal ileum. Appendix not identified. No pericecal inflammation. No dilated small bowel. Negative stomach and duodenum.  Native kidneys are surgically absent as before. Noncontrast liver, gallbladder, spleen, pancreas and adrenal glands are within normal limits. No abdominal free fluid. No definite lymphadenopathy.  IMPRESSION: 1. Stable CT appearance of the abdomen and pelvis since 2013. Stable CT appearance of the right lower quadrant renal transplant, without transplant hydronephrosis. Native kidneys surgically absent as before. 2. No acute or inflammatory process identified.   Electronically Signed   By: Genevie Ann M.D.   On: 06/13/2014 07:36      Time spent:60 minutes Code Status:   FULL Family Communication:   No Family at bedside   Jahayra Mazo, DO  [facility] Pager [phone]  If 7PM-7AM, please contact night-coverage [email or web address] Password [facility] 06/13/2014, 8:43 AM

## 2014-06-13 NOTE — ED Notes (Signed)
Consulting MD at bedside

## 2014-06-13 NOTE — Progress Notes (Signed)
ANTIBIOTIC CONSULT NOTE - INITIAL  Pharmacy Consult for Vancomycin and Zosyn Indication: sepsis (UTI/pyelonephritis)  Allergies  Allergen Reactions  . Levofloxacin Itching and Rash    Patient Measurements: Height: 4\' 11"  (149.9 cm) Weight: 115 lb 4.8 oz (52.3 kg) IBW/kg (Calculated) : 43.2  Vital Signs: Temp: 98.2 F (36.8 C) (05/11 1353) Temp Source: Oral (05/11 1353) BP: 93/44 mmHg (05/11 1353) Pulse Rate: 60 (05/11 1353) Intake/Output from previous day:   Intake/Output from this shift: Total I/O In: 120 [P.O.:120] Out: 250 [Urine:250]  Labs:  Recent Labs  06/13/14 0035  WBC 5.1  HGB 8.8*  PLT 194  CREATININE 3.01*   Estimated Creatinine Clearance: 17.3 mL/min (by C-G formula based on Cr of 3.01). No results for input(s): VANCOTROUGH, VANCOPEAK, VANCORANDOM, GENTTROUGH, GENTPEAK, GENTRANDOM, TOBRATROUGH, TOBRAPEAK, TOBRARND, AMIKACINPEAK, AMIKACINTROU, AMIKACIN in the last 72 hours.   Microbiology: No results found for this or any previous visit (from the past 720 hour(s)).  Medical History: Past Medical History  Diagnosis Date  . Gastroparesis   . Gout   . Hypotension   . Herpes genitalia 1994  . E coli bacteremia 06/18/2011  . Bacteremia due to Gram-negative bacteria 05/23/2011  . History of blood transfusion     "more than a few times" (12/11/2011)  . History of stomach ulcers   . Stroke      left basal ganglia lacunar infarct; Right frontal lobe lacunar infarct.  . Stroke ~ 1999; 2001    "briefly lost my vision; lost my right eye" (12/11/2011)  . Chronic lower back pain   . Depression   . Osteopenia   . Headache(784.0)     "not often anymore" (12/11/2011)  . Complication of anesthesia     "woke up during OR; I have an extremely high tolerance" (12/11/2011)  . Dysrhythmia     "tachycardia" (12/11/2011)  . ESRD (end stage renal disease) 06/12/2011  . Iron deficiency anemia   . Seizures 1994    "post transplant; only have had that one" (12/11/2011)   . GERD (gastroesophageal reflux disease)   . DDD (degenerative disc disease), cervical     Medications:  Prescriptions prior to admission  Medication Sig Dispense Refill Last Dose  . aspirin EC 81 MG tablet Take 81 mg by mouth daily.   06/12/2014 at Unknown time  . azaTHIOprine (IMURAN) 50 MG tablet Take 100 mg by mouth daily.    06/12/2014 at Unknown time  . colchicine 0.6 MG tablet Take 0.6 mg by mouth daily as needed (For gout.).   06/12/2014 at Unknown time  . Darbepoetin Alfa-Albumin (ARANESP IJ) Inject as directed every 30 (thirty) days.    Past Week at Unknown time  . dicyclomine (BENTYL) 20 MG tablet Take 1 tablet (20 mg total) by mouth 4 (four) times daily -  before meals and at bedtime. (Patient taking differently: Take 20 mg by mouth 3 (three) times daily as needed. ) 60 tablet 0 Past Week at Unknown time  . furosemide (LASIX) 40 MG tablet Take 40 mg by mouth every evening.    06/12/2014 at Unknown time  . gabapentin (NEURONTIN) 300 MG capsule Take 300 mg by mouth at bedtime.    06/11/2014 at Unknown time  . HYDROcodone-acetaminophen (NORCO) 10-325 MG per tablet Take 1 tablet by mouth every 6 (six) hours as needed for moderate pain.   0 06/12/2014 at Unknown time  . metoCLOPramide (REGLAN) 5 MG tablet TAKE 1 TABLET (5 MG TOTAL) BY MOUTH AT BEDTIME AND MAY REPEAT  DOSE ONE TIME IF NEEDED. (Patient taking differently: Take 5 mg by mouth 2 (two) times daily. Can take more doses prn.) 60 tablet 11 06/12/2014 at Unknown time  . Multiple Vitamin (MULTIVITAMIN) tablet Take 1 tablet by mouth daily.   06/12/2014 at Unknown time  . nystatin (MYCOSTATIN) 100000 UNIT/ML suspension Take 5 mLs (500,000 Units total) by mouth 4 (four) times daily. (Patient taking differently: Take 5 mLs by mouth 4 (four) times daily as needed. ) 240 mL 11 06/12/2014 at Unknown time  . omeprazole (PRILOSEC) 40 MG capsule Take 40 mg by mouth 2 (two) times daily.    06/12/2014 at Unknown time  . ondansetron (ZOFRAN-ODT) 4 MG  disintegrating tablet Take 1 tablet (4 mg total) by mouth every 6 (six) hours. (Patient taking differently: Take 4 mg by mouth every 6 (six) hours as needed for nausea or vomiting. ) 40 tablet 0 06/12/2014 at Unknown time  . oxyCODONE (ROXICODONE) 5 MG immediate release tablet Take 1 tab daily as needed for pain. (Patient taking differently: Take 5 mg by mouth daily as needed (pain). ) 30 tablet 0 06/12/2014 at Unknown time  . oxyCODONE-acetaminophen (PERCOCET/ROXICET) 5-325 MG per tablet Take 1-2 tablets by mouth every 6 (six) hours as needed for severe pain. 20 tablet 0 06/12/2014 at Unknown time  . predniSONE (DELTASONE) 5 MG tablet Take 5 mg by mouth daily.   06/12/2014 at Unknown time  . promethazine (PHENERGAN) 25 MG tablet Take 1 tablet (25 mg total) by mouth every 6 (six) hours as needed for nausea or vomiting. 12 tablet 0 Past Month at Unknown time  . tacrolimus (PROGRAF) 1 MG capsule Take 3 mg by mouth 2 (two) times daily.    06/12/2014 at Unknown time  . triamcinolone cream (KENALOG) 0.1 % Apply 1 application topically as needed.   2 06/12/2014 at Unknown time  . methocarbamol (ROBAXIN) 500 MG tablet Take 1 tablet (500 mg total) by mouth 2 (two) times daily. (Patient not taking: Reported on 05/18/2014) 20 tablet 0 Not Taking at Unknown time  . promethazine (PHENERGAN) 25 MG suppository Place 1 suppository (25 mg total) rectally every 6 (six) hours as needed for nausea or vomiting. (Patient not taking: Reported on 06/13/2014) 6 each 1 Not Taking at Unknown time   Assessment: 49 y.o. female presents with abd pain, fever. Noted pt s/p renal transplant 2005 and on chronic immunosuppression. To begin Vancomycin and Zosyn for r/o sepsis - likely source UTI/pyelonephritis. SCr up to 3.01 (baseline 2.2-2.4), est CrCl 17 ml/min. WBC wnl. Afeb. LA wnl. PCT wnl.  5/11 Vanc>> 5/11 Zosyn>>  5/11 Bld c2>> 5/11 Urine>>  Goal of Therapy:  Vancomycin trough level 10-15 mcg/ml  Plan:  Vancomcyin 750mg  IV  q48h Zosyn 2.25gm IV q8h Will f/u renal function, pt's clinical condition, and micro data  Vanc trough at Css if continues  Sherlon Handing, PharmD, BCPS Clinical pharmacist, pager [phone] 06/13/2014,2:17 PM

## 2014-06-13 NOTE — ED Notes (Signed)
Pt states that she is normally hypotensive in [id number] systolic.

## 2014-06-13 NOTE — ED Notes (Signed)
Pt states that she is "hurting where my kidney is" pointing to RLQ of her abdomen, pt is a transplant pt. Pt states that over the past 2 weeks she hasn't been eating or drinking well.

## 2014-06-13 NOTE — ED Notes (Signed)
Pt instructed to drink 1st cup over 1 hour and to finish it about 630 am, pt instructed to then start 2nd cup and finish that cup about 730 am at which point she would then be able to get her CT scan.

## 2014-06-13 NOTE — ED Notes (Signed)
MD at bedside. 

## 2014-06-13 NOTE — Progress Notes (Signed)
Utilization review completed.  

## 2014-06-13 NOTE — ED Notes (Addendum)
Pt vomited about 200 ml while this RN was present. Pt denies feeling nauseated anymore and declines anti nausea medications.

## 2014-06-13 NOTE — ED Notes (Addendum)
Pt states that her "normal" is "in the 90's and 80's, I'm hypotensive, my right arm usually has higher blood pressure"

## 2014-06-13 NOTE — ED Notes (Signed)
Pt. reports low abdominal pain and low back pain onset this week with generalized weakness/fatigue and poor appetite.

## 2014-06-14 DIAGNOSIS — G894 Chronic pain syndrome: Secondary | ICD-10-CM

## 2014-06-14 LAB — BASIC METABOLIC PANEL
Anion gap: 7 (ref 5–15)
BUN: 32 mg/dL — ABNORMAL HIGH (ref 6–20)
CO2: 21 mmol/L — ABNORMAL LOW (ref 22–32)
Calcium: 9.1 mg/dL (ref 8.9–10.3)
Chloride: 108 mmol/L (ref 101–111)
Creatinine, Ser: 2.53 mg/dL — ABNORMAL HIGH (ref 0.44–1.00)
GFR calc Af Amer: 25 mL/min — ABNORMAL LOW (ref >60)
GFR calc non Af Amer: 22 mL/min — ABNORMAL LOW (ref >60)
Glucose, Bld: 99 mg/dL (ref 65–99)
Potassium: 5.1 mmol/L (ref 3.5–5.1)
Sodium: 136 mmol/L (ref 135–145)

## 2014-06-14 LAB — CBC
HCT: 23.6 % — ABNORMAL LOW (ref 36.0–46.0)
Hemoglobin: 7.7 g/dL — ABNORMAL LOW (ref 12.0–15.0)
MCH: 30.3 pg (ref 26.0–34.0)
MCHC: 32.6 g/dL (ref 30.0–36.0)
MCV: 92.9 fL (ref 78.0–100.0)
Platelets: 188 10*3/uL (ref 150–400)
RBC: 2.54 MIL/uL — ABNORMAL LOW (ref 3.87–5.11)
RDW: 20.9 % — ABNORMAL HIGH (ref 11.5–15.5)
WBC: 3.2 10*3/uL — ABNORMAL LOW (ref 4.0–10.5)

## 2014-06-14 MED ORDER — SACCHAROMYCES BOULARDII 250 MG PO CAPS
250.0000 mg | ORAL_CAPSULE | Freq: Two times a day (BID) | ORAL | Status: DC
  Administered 2014-06-14 – 2014-06-16 (×5): 250 mg via ORAL
  Filled 2014-06-14 (×6): qty 1

## 2014-06-14 MED ORDER — OXYCODONE HCL 5 MG PO TABS
5.0000 mg | ORAL_TABLET | ORAL | Status: DC | PRN
  Administered 2014-06-14: 5 mg via ORAL
  Administered 2014-06-14: 10 mg via ORAL
  Administered 2014-06-14: 5 mg via ORAL
  Administered 2014-06-15 – 2014-06-16 (×5): 10 mg via ORAL
  Filled 2014-06-14: qty 2
  Filled 2014-06-14: qty 1
  Filled 2014-06-14: qty 2
  Filled 2014-06-14: qty 1
  Filled 2014-06-14: qty 2
  Filled 2014-06-14: qty 1
  Filled 2014-06-14 (×3): qty 2

## 2014-06-14 MED ORDER — OXYCODONE-ACETAMINOPHEN 5-325 MG PO TABS
1.0000 | ORAL_TABLET | ORAL | Status: DC | PRN
Start: 2014-06-14 — End: 2014-06-14

## 2014-06-14 MED ORDER — DIPHENHYDRAMINE HCL 25 MG PO CAPS
25.0000 mg | ORAL_CAPSULE | Freq: Four times a day (QID) | ORAL | Status: DC | PRN
  Administered 2014-06-14 – 2014-06-15 (×4): 25 mg via ORAL
  Filled 2014-06-14 (×4): qty 1

## 2014-06-14 NOTE — Progress Notes (Signed)
PROGRESS NOTE  Tina Watkins [id number] DOB: 11-17-1965 DOA: 06/13/2014 PCP: [name], MD  Assessment/Plan: sepsis  -Likely urinary source--concerned about transplant kidney pyelonephritis, although the patient's exam is somewhat confounded by the patient's chronic pain  -Given the patient's immunosuppression with Imuran and Prograf and history of bacteremia: broad spectrum antibiotics including Zosyn and vancomycin pending culture data  -Blood cultures 2 sets--please note that these were obtained after the patient received ceftriaxone  -CT abdomen and pelvis revealed RLQ renal transfer without hydronephrosis; stable pelvic vascular congestion; no other acute findings -Initial lactic acid 0.70  -procalcitonin 0.82 -Start patient on stress steroids--start Solu-Medrol 40 mg IV daily and wean as indicated  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) -Due to volume depletion and sepsis -Baseline creatinine 2.2-2.4   UTI/pyelonephritis -Broad spectrum antibiotics as discussed above -CT abdomen and pelvis negative for abscess  GERD/gastroparesis/chronic abdominal pain -Continue Reglan, PPI, Bentyl  Hypotension -Patient states that she chronically running low blood pressures with systolics in the upper [id number] and low 90s; however, I am concerned given the patient's current presentation she has superimposed sepsis -avoid IV narcotics  History of renal transplant -Performed in July 2005 at [place] and Prograf   History of stroke  -Continue aspirin 81 mg daily   Code Status: full Family Communication: patient Disposition Plan:    Consultants:    Procedures:      HPI/Subjective: Feels like she is getting volume overloaded Asking for IV narcotics for chronic pain  Objective: Filed Vitals:   06/14/14 0525  BP: 98/58  Pulse:   Temp: 97.9 F (36.6 C)  Resp: 16    Intake/Output Summary  (Last 24 hours) at 06/14/14 1041 Last data filed at 06/13/14 2118  Gross per 24 hour  Intake    360 ml  Output    850 ml  Net   -490 ml   Filed Weights   06/13/14 0024 06/13/14 0951  Weight: 50.803 kg (112 lb) 52.3 kg (115 lb 4.8 oz)    Exam:   General:  A+Ox3, NAD  Cardiovascular: rrr  Respiratory: clear  Abdomen: +BS, soft  Musculoskeletal: no edema  Data Reviewed: Basic Metabolic Panel:  Recent Labs Lab 06/08/14 0955 06/13/14 0035 06/14/14 0538  NA 138 134* 136  K 4.5 5.0 5.1  CL 103 101 108  CO2 24 20* 21*  GLUCOSE 94 99 99  BUN 43* 41* 32*  CREATININE 2.74* 3.01* 2.53*  CALCIUM 10.1 9.5 9.1  PHOS 4.0  --   --    Liver Function Tests:  Recent Labs Lab 06/08/14 0955 06/13/14 0035  AST  --  14*  ALT  --  9*  ALKPHOS  --  64  BILITOT  --  0.9  PROT  --  7.2  ALBUMIN 4.3 3.8    Recent Labs Lab 06/13/14 0035  LIPASE 24   No results for input(s): AMMONIA in the last 168 hours. CBC:  Recent Labs Lab 06/08/14 0955 06/13/14 0035 06/14/14 0538  WBC  --  5.1 3.2*  NEUTROABS  --  4.6  --   HGB 10.4* 8.8* 7.7*  HCT  --  26.3* 23.6*  MCV  --  92.3 92.9  PLT  --  194 188   Cardiac Enzymes: No results for input(s): CKTOTAL, CKMB, CKMBINDEX, TROPONINI in the last 168 hours. BNP (last 3 results) No results for input(s): BNP in the last 8760 hours.  ProBNP (last 3 results) No results for  input(s): PROBNP in the last 8760 hours.  CBG: No results for input(s): GLUCAP in the last 168 hours.  Recent Results (from the past 240 hour(s))  Culture, blood (routine x 2)     Status: None (Preliminary result)   Collection Time: 06/13/14 11:29 AM  Result Value Ref Range Status   Specimen Description BLOOD RIGHT WRIST  Final   Special Requests   Final    BOTTLES DRAWN AEROBIC AND ANAEROBIC 10CCBLUE 7CCRED   Culture   Final           BLOOD CULTURE RECEIVED NO GROWTH TO DATE CULTURE WILL BE HELD FOR 5 DAYS BEFORE ISSUING A FINAL NEGATIVE  REPORT Performed at [facility]    Report Status PENDING  Incomplete  Culture, blood (routine x 2)     Status: None (Preliminary result)   Collection Time: 06/13/14 11:41 AM  Result Value Ref Range Status   Specimen Description BLOOD LEFT HAND  Final   Special Requests BOTTLES DRAWN AEROBIC AND ANAEROBIC 10CC  Final   Culture   Final           BLOOD CULTURE RECEIVED NO GROWTH TO DATE CULTURE WILL BE HELD FOR 5 DAYS BEFORE ISSUING A FINAL NEGATIVE REPORT Performed at [facility]    Report Status PENDING  Incomplete     Studies: Ct Abdomen Pelvis Wo Contrast  06/13/2014   CLINICAL DATA:  49 year old female with right flank and lower quadrant pain nausea and vomiting. Initial encounter. Current history of renal transplant.  EXAM: CT ABDOMEN AND PELVIS WITHOUT CONTRAST  TECHNIQUE: Multidetector CT imaging of the abdomen and pelvis was performed following the standard protocol without IV contrast.  COMPARISON:  Acute abdominal series [id number]. CT Abdomen and Pelvis 12/08/2011.  FINDINGS: Stable lung bases with middle lobe and lower lobe scarring on the right. Small left lower lobe calcified granulomas. No pericardial or pleural effusion.  No acute osseous abnormality identified.  Right femoral head AVN.  Right lower quadrant renal transplant is stable in size and configuration. No transplant hydronephrosis or hydroureter. Diminutive bladder. Fairly extensive calcified atherosclerosis, worst in the pelvis and proximal lower extremities than in the abdomen. Chronic retroperitoneal venous collaterals which communicate with the azygos and hemi azygous system appear to be in response to diminutive IVC at the native renal level.  No pelvic free fluid. Pelvic vascular congestion/ mild stranding appears stable. Stable noncontrast appearance of the uterus. At adnexa might be surgically absent. Dense soft tissue calcification along the course of the right external iliac artery is unchanged,  multiple surgical clips in the area. This might be a chronic vascular finding related to the transplant surgical changes.  Decompressed rectum and sigmoid. Oral contrast in the left colon. Contrast throughout the transverse and right colon. The cecum appears to be located in the midline. Negative terminal ileum. Appendix not identified. No pericecal inflammation. No dilated small bowel. Negative stomach and duodenum.  Native kidneys are surgically absent as before. Noncontrast liver, gallbladder, spleen, pancreas and adrenal glands are within normal limits. No abdominal free fluid. No definite lymphadenopathy.  IMPRESSION: 1. Stable CT appearance of the abdomen and pelvis since 2013. Stable CT appearance of the right lower quadrant renal transplant, without transplant hydronephrosis. Native kidneys surgically absent as before. 2. No acute or inflammatory process identified.   Electronically Signed   By: Genevie Ann M.D.   On: 06/13/2014 07:36    Scheduled Meds: . aspirin EC  81 mg Oral Daily  .  azaTHIOprine  100 mg Oral Daily  . gabapentin  300 mg Oral QHS  . heparin  5,000 Units Subcutaneous 3 times per day  . methylPREDNISolone (SOLU-MEDROL) injection  40 mg Intravenous Daily  . metoCLOPramide  5 mg Oral BID  . pantoprazole  80 mg Oral Daily  . piperacillin-tazobactam (ZOSYN)  IV  2.25 g Intravenous 3 times per day  . saccharomyces boulardii  250 mg Oral BID  . sodium chloride  3 mL Intravenous Q12H  . tacrolimus  3 mg Oral BID  . vancomycin  750 mg Intravenous Q48H   Continuous Infusions:  Antibiotics Given (last 72 hours)    Date/Time Action Medication Dose Rate   06/13/14 1155 Given   vancomycin (VANCOCIN) IVPB 750 mg/150 ml premix 750 mg 150 mL/hr   06/13/14 1556 Given   piperacillin-tazobactam (ZOSYN) IVPB 2.25 g 2.25 g 100 mL/hr   06/13/14 2118 Given   piperacillin-tazobactam (ZOSYN) IVPB 2.25 g 2.25 g 100 mL/hr   06/14/14 0536 Given   piperacillin-tazobactam (ZOSYN) IVPB 2.25 g 2.25 g  100 mL/hr      Active Problems:   Gastroparesis   Chronic pain disorder   Acute on chronic renal failure   Pyelonephritis, acute   Sepsis    Time spent: [place] [place] [facility] Pager [phone]. If 7PM-7AM, please contact night-coverage at [email or web address], password [facility] 06/14/2014, 10:41 AM  LOS: 1 day

## 2014-06-14 NOTE — Progress Notes (Signed)
Pt bradycardic HR 47-55 bpm, on-call MD paged. No new orders.

## 2014-06-15 DIAGNOSIS — B009 Herpesviral infection, unspecified: Secondary | ICD-10-CM

## 2014-06-15 DIAGNOSIS — N189 Chronic kidney disease, unspecified: Secondary | ICD-10-CM

## 2014-06-15 DIAGNOSIS — M62838 Other muscle spasm: Secondary | ICD-10-CM | POA: Diagnosis present

## 2014-06-15 DIAGNOSIS — K649 Unspecified hemorrhoids: Secondary | ICD-10-CM | POA: Diagnosis present

## 2014-06-15 DIAGNOSIS — D638 Anemia in other chronic diseases classified elsewhere: Secondary | ICD-10-CM | POA: Diagnosis present

## 2014-06-15 DIAGNOSIS — D631 Anemia in chronic kidney disease: Secondary | ICD-10-CM | POA: Diagnosis present

## 2014-06-15 LAB — URINE CULTURE: Colony Count: >=100000

## 2014-06-15 LAB — BASIC METABOLIC PANEL
Anion gap: 9 (ref 5–15)
BUN: 29 mg/dL — ABNORMAL HIGH (ref 6–20)
CO2: 20 mmol/L — ABNORMAL LOW (ref 22–32)
Calcium: 9.3 mg/dL (ref 8.9–10.3)
Chloride: 108 mmol/L (ref 101–111)
Creatinine, Ser: 2.68 mg/dL — ABNORMAL HIGH (ref 0.44–1.00)
GFR calc Af Amer: 23 mL/min — ABNORMAL LOW (ref >60)
GFR calc non Af Amer: 20 mL/min — ABNORMAL LOW (ref >60)
Glucose, Bld: 85 mg/dL (ref 65–99)
Potassium: 4.8 mmol/L (ref 3.5–5.1)
Sodium: 137 mmol/L (ref 135–145)

## 2014-06-15 LAB — CBC
HCT: 23.3 % — ABNORMAL LOW (ref 36.0–46.0)
Hemoglobin: 7.7 g/dL — ABNORMAL LOW (ref 12.0–15.0)
MCH: 30.9 pg (ref 26.0–34.0)
MCHC: 33 g/dL (ref 30.0–36.0)
MCV: 93.6 fL (ref 78.0–100.0)
Platelets: 198 10*3/uL (ref 150–400)
RBC: 2.49 MIL/uL — ABNORMAL LOW (ref 3.87–5.11)
RDW: 21.2 % — ABNORMAL HIGH (ref 11.5–15.5)
WBC: 3.7 10*3/uL — ABNORMAL LOW (ref 4.0–10.5)

## 2014-06-15 LAB — PROCALCITONIN: Procalcitonin: 0.48 ng/mL

## 2014-06-15 MED ORDER — HYDROCORTISONE ACETATE 25 MG RE SUPP
25.0000 mg | Freq: Two times a day (BID) | RECTAL | Status: DC
  Administered 2014-06-15 – 2014-06-16 (×3): 25 mg via RECTAL
  Filled 2014-06-15 (×5): qty 1

## 2014-06-15 MED ORDER — VALACYCLOVIR HCL 500 MG PO TABS
500.0000 mg | ORAL_TABLET | Freq: Two times a day (BID) | ORAL | Status: DC
Start: 2014-06-15 — End: 2014-06-16
  Administered 2014-06-15 – 2014-06-16 (×3): 500 mg via ORAL
  Filled 2014-06-15 (×4): qty 1

## 2014-06-15 MED ORDER — PREDNISONE 5 MG PO TABS
5.0000 mg | ORAL_TABLET | Freq: Every day | ORAL | Status: DC
  Administered 2014-06-16: 5 mg via ORAL
  Filled 2014-06-15 (×2): qty 1

## 2014-06-15 MED ORDER — CYCLOBENZAPRINE HCL 10 MG PO TABS
10.0000 mg | ORAL_TABLET | Freq: Three times a day (TID) | ORAL | Status: DC
  Administered 2014-06-15 – 2014-06-16 (×3): 10 mg via ORAL
  Filled 2014-06-15 (×5): qty 1

## 2014-06-15 MED ORDER — CEFUROXIME AXETIL 500 MG PO TABS
500.0000 mg | ORAL_TABLET | Freq: Two times a day (BID) | ORAL | Status: DC
  Administered 2014-06-15 – 2014-06-16 (×2): 500 mg via ORAL
  Filled 2014-06-15 (×7): qty 1

## 2014-06-15 MED ORDER — PANTOPRAZOLE SODIUM 40 MG PO TBEC
40.0000 mg | DELAYED_RELEASE_TABLET | Freq: Two times a day (BID) | ORAL | Status: DC
  Administered 2014-06-15 – 2014-06-16 (×2): 40 mg via ORAL
  Filled 2014-06-15 (×2): qty 1

## 2014-06-15 NOTE — Progress Notes (Signed)
PT Cancellation Note  Patient Details Name: Tina Watkins MRN: [id number] DOB: [birth year]   Cancelled Treatment:    Reason Eval/Treat Not Completed: PT screened, pt wanted PT for neck traction. Explained to pt that neck traction isn't a service provided by acute PT services. Recommend pt go to OPPT for evaluation and treatment of neck pain.   Keria Widrig 06/15/2014, 10:51 AM  [place]

## 2014-06-15 NOTE — Progress Notes (Signed)
PROGRESS NOTE  Tina Watkins [id number] DOB: 1965-05-29 DOA: 06/13/2014 PCP: [name], MD  Assessment/Plan: sepsis  Secondary to UTI. Growing proteus. Narrow abx and change to po. Sensitivity pending. improving  Bleeding hemorrhoid. D/c heparin and asa. Monitor h/h. anusol HC.  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 Wants iv pain meds. Eating ok. Small amounts hematochezia with solid stool. C/o herpes outbreak  Objective: Filed Vitals:   06/15/14 1716  BP:   Pulse:   Temp: 98.8 F (37.1 C)  Resp:     Intake/Output Summary (Last 24 hours) at 06/15/14 2027 Last data filed at 06/15/14 1450  Gross per 24 hour  Intake    222 ml  Output    700 ml  Net   -478 ml   Filed Weights   06/13/14 0024 06/13/14 0951  Weight: 50.803 kg (112 lb) 52.3 kg (115 lb 4.8 oz)    Exam:   General:  A+Ox3, NAD. Eating lunch  Cardiovascular: rrr without mgr  Respiratory: clear without WRR  Abdomen: +BS, soft  Rectal: external hemorroids. No bleeding. DRE not done.  Vesicular rash perineum  Musculoskeletal: no edema  Data Reviewed: Basic Metabolic Panel:  Recent Labs Lab 06/13/14 0035 06/14/14 0538 06/15/14 0430  NA 134* 136 137  K 5.0 5.1 4.8  CL 101 108 108  CO2 20* 21* 20*  GLUCOSE 99 99 85  BUN 41* 32* 29*  CREATININE 3.01* 2.53* 2.68*  CALCIUM 9.5 9.1 9.3    Liver Function Tests:  Recent Labs Lab 06/13/14 0035  AST 14*  ALT 9*  ALKPHOS 64  BILITOT 0.9  PROT 7.2  ALBUMIN 3.8    Recent Labs Lab 06/13/14 0035  LIPASE 24   No results for input(s): AMMONIA in the last 168 hours. CBC:  Recent Labs Lab 06/13/14 0035 06/14/14 0538 06/15/14 0430  WBC 5.1 3.2* 3.7*  NEUTROABS 4.6  --   --   HGB 8.8* 7.7* 7.7*  HCT 26.3* 23.6* 23.3*  MCV 92.3 92.9 93.6  PLT 194 188 198   Cardiac Enzymes: No results for input(s): CKTOTAL, CKMB, CKMBINDEX, TROPONINI in the last 168 hours. BNP (last 3 results) No results for input(s): BNP in the last 8760 hours.  ProBNP (last 3 results) No results for input(s): PROBNP in the last 8760 hours.  CBG: No results for input(s): GLUCAP in the last 168 hours.  Recent Results (from the past 240 hour(s))  Urine culture     Status: None   Collection Time: 06/13/14  5:19 AM  Result Value Ref Range Status   Specimen Description URINE, CATHETERIZED  Final   Special Requests NONE  Final   Colony Count   Final    >=100,000 COLONIES/ML Performed at [place] Performed at [facility]    Report Status 06/15/2014 FINAL  Final   Organism ID, Bacteria PROTEUS MIRABILIS  Final  Susceptibility   Proteus mirabilis - MIC*    AMPICILLIN <=2 SENSITIVE Sensitive     CEFAZOLIN <=4 SENSITIVE Sensitive     CEFTRIAXONE <=1 SENSITIVE Sensitive     CIPROFLOXACIN <=0.25 SENSITIVE Sensitive     GENTAMICIN <=1 SENSITIVE Sensitive     LEVOFLOXACIN <=0.12 SENSITIVE Sensitive     NITROFURANTOIN 128 RESISTANT Resistant     TOBRAMYCIN <=1 SENSITIVE Sensitive     TRIMETH/SULFA <=20 SENSITIVE Sensitive     PIP/TAZO <=4 SENSITIVE Sensitive     * PROTEUS MIRABILIS  Culture, blood (routine x 2)     Status: None (Preliminary result)   Collection Time: 06/13/14 11:29 AM  Result Value Ref Range Status   Specimen Description BLOOD RIGHT WRIST  Final   Special  Requests   Final    BOTTLES DRAWN AEROBIC AND ANAEROBIC 10CCBLUE 7CCRED   Culture   Final           BLOOD CULTURE RECEIVED NO GROWTH TO DATE CULTURE WILL BE HELD FOR 5 DAYS BEFORE ISSUING A FINAL NEGATIVE REPORT Performed at [facility]    Report Status PENDING  Incomplete  Culture, blood (routine x 2)     Status: None (Preliminary result)   Collection Time: 06/13/14 11:41 AM  Result Value Ref Range Status   Specimen Description BLOOD LEFT HAND  Final   Special Requests BOTTLES DRAWN AEROBIC AND ANAEROBIC 10CC  Final   Culture   Final           BLOOD CULTURE RECEIVED NO GROWTH TO DATE CULTURE WILL BE HELD FOR 5 DAYS BEFORE ISSUING A FINAL NEGATIVE REPORT Performed at [facility]    Report Status PENDING  Incomplete     Studies: No results found.  Scheduled Meds: . azaTHIOprine  100 mg Oral Daily  . cefUROXime  500 mg Oral BID WC  . cyclobenzaprine  10 mg Oral TID  . gabapentin  300 mg Oral QHS  . hydrocortisone  25 mg Rectal BID  . metoCLOPramide  5 mg Oral BID  . pantoprazole  40 mg Oral BID  . [START ON 06/16/2014] predniSONE  5 mg Oral Q breakfast  . saccharomyces boulardii  250 mg Oral BID  . sodium chloride  3 mL Intravenous Q12H  . tacrolimus  3 mg Oral BID  . valACYclovir  500 mg Oral BID   Continuous Infusions:  Antibiotics Given (last 72 hours)    Date/Time Action Medication Dose Rate   06/13/14 1155 Given   vancomycin (VANCOCIN) IVPB 750 mg/150 ml premix 750 mg 150 mL/hr   06/13/14 1556 Given   piperacillin-tazobactam (ZOSYN) IVPB 2.25 g 2.25 g 100 mL/hr   06/13/14 2118 Given   piperacillin-tazobactam (ZOSYN) IVPB 2.25 g 2.25 g 100 mL/hr   06/14/14 0536 Given   piperacillin-tazobactam (ZOSYN) IVPB 2.25 g 2.25 g 100 mL/hr   06/14/14 1455 Given   piperacillin-tazobactam (ZOSYN) IVPB 2.25 g 2.25 g 100 mL/hr   06/14/14 2227 Given   piperacillin-tazobactam (ZOSYN) IVPB 2.25 g 2.25 g 100 mL/hr   06/15/14 0534 Given   piperacillin-tazobactam  (ZOSYN) IVPB 2.25 g 2.25 g 100 mL/hr   06/15/14 1349 Given   valACYclovir (VALTREX) tablet 500 mg 500 mg    06/15/14 1648 Given   cefUROXime (CEFTIN) tablet 500 mg 500 mg       Active Problems:   Gastroparesis   Herpes infection   Chronic pain disorder   Acute on chronic renal failure   Pyelonephritis, acute  Sepsis   Muscle spasms of neck   Bleeding hemorrhoid    Time spent: 25  [place] [facility] Pager [phone] [email or web address], password [facility] [address] 06/15/2014, 8:27 PM  LOS: 2 days

## 2014-06-16 DIAGNOSIS — N390 Urinary tract infection, site not specified: Secondary | ICD-10-CM | POA: Insufficient documentation

## 2014-06-16 LAB — HEMOGLOBIN AND HEMATOCRIT, BLOOD
HCT: 23.7 % — ABNORMAL LOW (ref 36.0–46.0)
Hemoglobin: 7.6 g/dL — ABNORMAL LOW (ref 12.0–15.0)

## 2014-06-16 MED ORDER — HYDROCORTISONE ACETATE 25 MG RE SUPP: 25.0000 mg | Freq: Two times a day (BID) | RECTAL | Status: DC

## 2014-06-16 MED ORDER — VALACYCLOVIR HCL 500 MG PO TABS: 500.0000 mg | ORAL_TABLET | Freq: Two times a day (BID) | ORAL | Status: DC

## 2014-06-16 MED ORDER — ACETAMINOPHEN 325 MG PO TABS: 650.0000 mg | ORAL_TABLET | Freq: Four times a day (QID) | ORAL | Status: DC | PRN

## 2014-06-16 MED ORDER — CEPHALEXIN 500 MG PO CAPS: 500.0000 mg | ORAL_CAPSULE | Freq: Two times a day (BID) | ORAL | Status: DC

## 2014-06-16 MED ORDER — CYCLOBENZAPRINE HCL 5 MG PO TABS: 5.0000 mg | ORAL_TABLET | Freq: Three times a day (TID) | ORAL | Status: DC | PRN

## 2014-06-16 NOTE — Progress Notes (Signed)
NURSING PROGRESS NOTE  Tina Watkins [id number] Discharge Data: 06/16/2014 2:06 PM Attending Provider: Delfina Redwood, MD [id number] A, MD     [name] to be D/C'd Home per MD order.  Discussed with the patient the After Visit Summary and all questions fully answered. All IV's discontinued with no bleeding noted. All belongings returned to patient for patient to take home.   Last Vital Signs:  Blood pressure 103/40, pulse 56, temperature 98.7 F (37.1 C), temperature source Oral, resp. rate 18, height 4\' 11"  (1.499 m), weight 52.3 kg (115 lb 4.8 oz), SpO2 100 %.  Discharge Medication List   Medication List    STOP taking these medications        methocarbamol 500 MG tablet  Commonly known as:  ROBAXIN      TAKE these medications        acetaminophen 325 MG tablet  Commonly known as:  TYLENOL  Take 2 tablets (650 mg total) by mouth every 6 (six) hours as needed for mild pain (or Fever >/= 101).     ARANESP IJ  Inject as directed every 30 (thirty) days.     aspirin EC 81 MG tablet  Take 81 mg by mouth daily.     azaTHIOprine 50 MG tablet  Commonly known as:  IMURAN  Take 100 mg by mouth daily.     cephALEXin 500 MG capsule  Commonly known as:  KEFLEX  Take 1 capsule (500 mg total) by mouth 2 (two) times daily. Until gone     colchicine 0.6 MG tablet  Take 0.6 mg by mouth daily as needed (For gout.).     cyclobenzaprine 5 MG tablet  Commonly known as:  FLEXERIL  Take 1 tablet (5 mg total) by mouth 3 (three) times daily as needed for muscle spasms.     dicyclomine 20 MG tablet  Commonly known as:  BENTYL  Take 1 tablet (20 mg total) by mouth 4 (four) times daily -  before meals and at bedtime.     furosemide 40 MG tablet  Commonly known as:  LASIX  Take 40 mg by mouth every evening.     gabapentin 300 MG capsule  Commonly known as:  NEURONTIN  Take 300 mg by mouth at bedtime.     HYDROcodone-acetaminophen 10-325 MG per tablet   Commonly known as:  NORCO  Take 1 tablet by mouth every 6 (six) hours as needed for moderate pain.     hydrocortisone 25 MG suppository  Commonly known as:  ANUSOL-HC  Place 1 suppository (25 mg total) rectally 2 (two) times daily.     metoCLOPramide 5 MG tablet  Commonly known as:  REGLAN  TAKE 1 TABLET (5 MG TOTAL) BY MOUTH AT BEDTIME AND MAY REPEAT DOSE ONE TIME IF NEEDED.     multivitamin tablet  Take 1 tablet by mouth daily.     nystatin 100000 UNIT/ML suspension  Commonly known as:  MYCOSTATIN  Take 5 mLs (500,000 Units total) by mouth 4 (four) times daily.     omeprazole 40 MG capsule  Commonly known as:  PRILOSEC  Take 40 mg by mouth 2 (two) times daily.     ondansetron 4 MG disintegrating tablet  Commonly known as:  ZOFRAN-ODT  Take 1 tablet (4 mg total) by mouth every 6 (six) hours.     oxyCODONE 5 MG immediate release tablet  Commonly known as:  ROXICODONE  Take 1 tab daily as needed for pain.  oxyCODONE-acetaminophen 5-325 MG per tablet  Commonly known as:  PERCOCET/ROXICET  Take 1-2 tablets by mouth every 6 (six) hours as needed for severe pain.     predniSONE 5 MG tablet  Commonly known as:  DELTASONE  Take 5 mg by mouth daily.     promethazine 25 MG tablet  Commonly known as:  PHENERGAN  Take 1 tablet (25 mg total) by mouth every 6 (six) hours as needed for nausea or vomiting.     promethazine 25 MG suppository  Commonly known as:  PHENERGAN  Place 1 suppository (25 mg total) rectally every 6 (six) hours as needed for nausea or vomiting.     tacrolimus 1 MG capsule  Commonly known as:  PROGRAF  Take 3 mg by mouth 2 (two) times daily.     triamcinolone cream 0.1 %  Commonly known as:  KENALOG  Apply 1 application topically as needed.     valACYclovir 500 MG tablet  Commonly known as:  VALTREX  Take 1 tablet (500 mg total) by mouth 2 (two) times daily. Until gone

## 2014-06-16 NOTE — Discharge Summary (Signed)
Physician Discharge Summary  Tina Watkins [id number] DOB: [birth year] DOA: 06/13/2014  PCP: [name], MD  Admit date: 06/13/2014 Discharge date: 06/16/2014  Time spent: greater than 30 mintues  Recommendations for Outpatient Follow-up:  1.   Discharge Diagnoses:  Active Problems:   Gastroparesis   Herpes infection   Chronic pain disorder   Acute on chronic renal failure   Pyelonephritis, acute   Sepsis   Muscle spasms of neck   Bleeding hemorrhoid   Anemia in chronic kidney disease   Discharge Condition: stable  Diet recommendation:   Filed Weights   06/13/14 0024 06/13/14 0951  Weight: 50.803 kg (112 lb) 52.3 kg (115 lb 4.8 oz)    History of present illness:  49 year old female with a history of renal transplant in July 2005, gastroparesis, stroke, chronic pain with chronic opioid use presented with two-week history of generalized weakness and myalgias. The patient states that she has had lower abdominal pain for the past 2 days with associated fevers up to 102.9F. The patient normally has chronic neck pain from degenerative disc disease which radiates to her right arm and shoulder, but she states that this is not worse than usual. She denies any headaches or visual disturbance. She denies any coughing or hemoptysis. She has abdominal pain which she states is worse than usual in the lower quadrant. In addition, the patient has had progressive generalized weakness the point where she is having difficulty getting out of bed. As a result, she can get emergency department for further evaluation. She denies any diarrhea, hematochezia, melena. She denies any dysuria, but complains of some urinary urgency. She denies any unusual rashes, chest discomfort.   In the emergency department, temperature was noted to be 99.4F with blood pressure 84/54. Oxygen saturation was 99-100 percent on room air. Sodium was 134 with potassium 5.0, serum creatinine 3.01 WBC was 5.1 with  hemoglobin 8.8 and platelets 194,000. Lactic acid 0.70. Urinalysis showed 11-20 WBCs. hepatic enzymes and lipase unremarkable  Hospital Course:   sepsis  Started on broad spectrum antibiotic.  Fluid resussitated  Secondary to UTI, proteus, sensitive to most. abx spectrum narrowed and changed to po.  Stable at discharge  Bleeding hemorrhoid. D/c heparin and asa. Monitor h/h. anusol HC.  If continues, may f/u with GI  Anemia: monitored. Did not require transfusion.  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  Changed back to pred 5 home dose. VSS  History of renal transplant -Performed in July 2005 at [facility]   History of stroke  -Continue aspirin 81 mg daily at discharge  Procedures:  none  Consultations:  none  Discharge Exam: Filed Vitals:   06/16/14 0521  BP: 93/54  Pulse: 57  Temp: 98 F (36.7 C)  Resp: 18    General: appears comfortable Cardiovascular: RRR Respiratory: RRR Abd s, nt, nd  Discharge Instructions   Discharge Instructions    Activity as tolerated - No restrictions    Complete by:  As directed      Diet - low sodium heart healthy    Complete by:  As directed           Current Discharge Medication List    START taking these medications   Details  acetaminophen (TYLENOL) 325 MG tablet Take 2 tablets (650 mg total) by mouth every 6 (six) hours as needed for mild pain (or Fever >/= 101).    cephALEXin (KEFLEX) 500 MG capsule Take 1 capsule (500 mg total) by mouth 2 (two) times daily. Until gone Qty: 8 capsule, Refills: 0    cyclobenzaprine (FLEXERIL) 5 MG tablet Take 1 tablet (5 mg  total) by mouth 3 (three) times daily as needed for muscle spasms. Qty: 20 tablet, Refills: 0    hydrocortisone (ANUSOL-HC) 25 MG suppository Place 1 suppository (25 mg total) rectally 2 (two) times daily. Qty: 12 suppository, Refills: 0    valACYclovir (VALTREX) 500 MG tablet Take 1 tablet (500 mg total) by mouth 2 (two) times daily. Until gone Qty: 8 tablet, Refills: 0      CONTINUE these medications which have NOT CHANGED   Details  aspirin EC 81 MG tablet Take 81 mg by mouth daily.    azaTHIOprine (IMURAN) 50 MG tablet Take 100 mg by mouth daily.     colchicine 0.6 MG tablet Take 0.6 mg by mouth daily as needed (For gout.).    Darbepoetin Alfa-Albumin (ARANESP IJ) Inject as directed every 30 (thirty) days.     dicyclomine (BENTYL) 20 MG tablet Take 1 tablet (20 mg total) by mouth 4 (four) times daily -  before meals and at bedtime. Qty: 60 tablet, Refills: 0    furosemide (LASIX) 40 MG tablet Take 40 mg by mouth every evening.     gabapentin (NEURONTIN) 300 MG capsule Take 300 mg by mouth at bedtime.     HYDROcodone-acetaminophen (NORCO) 10-325 MG per tablet Take 1 tablet by mouth every 6 (six) hours as needed for moderate pain.  Refills: 0    metoCLOPramide (REGLAN) 5 MG tablet TAKE 1 TABLET (5 MG TOTAL) BY MOUTH AT BEDTIME AND MAY REPEAT DOSE ONE TIME IF NEEDED. Qty: 60 tablet, Refills: 11    Multiple Vitamin (MULTIVITAMIN) tablet Take 1 tablet by mouth daily.    nystatin (MYCOSTATIN) 100000 UNIT/ML suspension Take 5 mLs (500,000 Units total) by mouth 4 (four) times daily. Qty: 240 mL, Refills: 11    omeprazole (PRILOSEC) 40 MG capsule Take 40 mg by mouth 2 (two) times daily.     ondansetron (ZOFRAN-ODT) 4 MG disintegrating tablet Take 1 tablet (4 mg total) by mouth every 6 (six) hours. Qty: 40 tablet, Refills: 0    oxyCODONE (ROXICODONE) 5 MG immediate release tablet Take 1 tab daily as needed for pain. Qty: 30 tablet, Refills: 0    oxyCODONE-acetaminophen  (PERCOCET/ROXICET) 5-325 MG per tablet Take 1-2 tablets by mouth every 6 (six) hours as needed for severe pain. Qty: 20 tablet, Refills: 0    predniSONE (DELTASONE) 5 MG tablet Take 5 mg by mouth daily.    promethazine (PHENERGAN) 25 MG tablet Take 1 tablet (25 mg total) by mouth every 6 (six) hours as needed for nausea or vomiting. Qty: 12 tablet, Refills: 0    tacrolimus (PROGRAF) 1 MG capsule Take 3 mg by mouth 2 (two) times daily.     triamcinolone cream (KENALOG) 0.1 % Apply 1 application topically as needed.  Refills: 2    promethazine (PHENERGAN) 25 MG suppository Place 1 suppository (25 mg total) rectally every 6 (six) hours as needed for nausea or vomiting. Qty: 6 each, Refills: 1      STOP taking these medications     methocarbamol (ROBAXIN) 500 MG tablet        Allergies  Allergen Reactions  . Levofloxacin Itching and Rash   Follow-up Information    Follow up with [name], MD.   Specialty:  Nephrology   Contact information:   [place] [phone]        The results of significant diagnostics from this hospitalization (including imaging, microbiology, ancillary and laboratory) are listed below for reference.    Significant Diagnostic Studies: Ct Abdomen Pelvis Wo Contrast  06/13/2014   CLINICAL DATA:  49 year old female with right flank and lower quadrant pain nausea and vomiting. Initial encounter. Current history of renal transplant.  EXAM: CT ABDOMEN AND PELVIS WITHOUT CONTRAST  TECHNIQUE: Multidetector CT imaging of the abdomen and pelvis was performed following the standard protocol without IV contrast.  COMPARISON:  Acute abdominal series [id number]. CT Abdomen and Pelvis 12/08/2011.  FINDINGS: Stable lung bases with middle lobe and lower lobe scarring on the right. Small left lower lobe calcified granulomas. No pericardial or pleural effusion.  No acute osseous abnormality identified.  Right femoral head AVN.  Right lower  quadrant renal transplant is stable in size and configuration. No transplant hydronephrosis or hydroureter. Diminutive bladder. Fairly extensive calcified atherosclerosis, worst in the pelvis and proximal lower extremities than in the abdomen. Chronic retroperitoneal venous collaterals which communicate with the azygos and hemi azygous system appear to be in response to diminutive IVC at the native renal level.  No pelvic free fluid. Pelvic vascular congestion/ mild stranding appears stable. Stable noncontrast appearance of the uterus. At adnexa might be surgically absent. Dense soft tissue calcification along the course of the right external iliac artery is unchanged, multiple surgical clips in the area. This might be a chronic vascular finding related to the transplant surgical changes.  Decompressed rectum and sigmoid. Oral contrast in the left colon. Contrast throughout the transverse and right colon. The cecum appears to be located in the midline. Negative terminal ileum. Appendix not identified. No pericecal inflammation. No dilated small bowel. Negative stomach and duodenum.  Native kidneys are surgically absent as before. Noncontrast liver, gallbladder, spleen, pancreas and adrenal glands are within normal limits. No abdominal free fluid. No definite lymphadenopathy.  IMPRESSION: 1. Stable CT appearance of the abdomen and pelvis since 2013. Stable CT appearance of the right lower quadrant renal transplant, without transplant hydronephrosis. Native kidneys surgically absent as before. 2. No acute or inflammatory process identified.   Electronically Signed   By: Genevie Ann M.D.   On: 06/13/2014 07:36    Microbiology: Recent Results (from the past 240 hour(s))  Urine culture     Status: None   Collection Time: 06/13/14  5:19 AM  Result Value Ref Range Status   Specimen Description URINE, CATHETERIZED  Final   Special Requests NONE  Final   Colony Count   Final    >=100,000 COLONIES/ML Performed at  [facility] Performed at [facility]    Report Status 06/15/2014 FINAL  Final   Organism ID, Bacteria PROTEUS MIRABILIS  Final      Susceptibility   Proteus mirabilis - MIC*    AMPICILLIN <=2 SENSITIVE Sensitive     CEFAZOLIN <=4 SENSITIVE Sensitive     CEFTRIAXONE <=1 SENSITIVE Sensitive     CIPROFLOXACIN <=0.25 SENSITIVE Sensitive     GENTAMICIN <=1 SENSITIVE Sensitive     LEVOFLOXACIN <=0.12 SENSITIVE Sensitive     NITROFURANTOIN 128 RESISTANT Resistant     TOBRAMYCIN <=1 SENSITIVE  Sensitive     TRIMETH/SULFA <=20 SENSITIVE Sensitive     PIP/TAZO <=4 SENSITIVE Sensitive     * PROTEUS MIRABILIS  Culture, blood (routine x 2)     Status: None (Preliminary result)   Collection Time: 06/13/14 11:29 AM  Result Value Ref Range Status   Specimen Description BLOOD RIGHT WRIST  Final   Special Requests   Final    BOTTLES DRAWN AEROBIC AND ANAEROBIC 10CCBLUE 7CCRED   Culture   Final           BLOOD CULTURE RECEIVED NO GROWTH TO DATE CULTURE WILL BE HELD FOR 5 DAYS BEFORE ISSUING A FINAL NEGATIVE REPORT Performed at [facility]    Report Status PENDING  Incomplete  Culture, blood (routine x 2)     Status: None (Preliminary result)   Collection Time: 06/13/14 11:41 AM  Result Value Ref Range Status   Specimen Description BLOOD LEFT HAND  Final   Special Requests BOTTLES DRAWN AEROBIC AND ANAEROBIC 10CC  Final   Culture   Final           BLOOD CULTURE RECEIVED NO GROWTH TO DATE CULTURE WILL BE HELD FOR 5 DAYS BEFORE ISSUING A FINAL NEGATIVE REPORT Performed at [facility]    Report Status PENDING  Incomplete     Labs: Basic Metabolic Panel:  Recent Labs Lab 06/13/14 0035 06/14/14 0538 06/15/14 0430  NA 134* 136 137  K 5.0 5.1 4.8  CL 101 108 108  CO2 20* 21* 20*  GLUCOSE 99 99 85  BUN 41* 32* 29*  CREATININE 3.01* 2.53* 2.68*  CALCIUM 9.5 9.1 9.3   Liver Function Tests:  Recent Labs Lab  06/13/14 0035  AST 14*  ALT 9*  ALKPHOS 64  BILITOT 0.9  PROT 7.2  ALBUMIN 3.8    Recent Labs Lab 06/13/14 0035  LIPASE 24   No results for input(s): AMMONIA in the last 168 hours. CBC:  Recent Labs Lab 06/13/14 0035 06/14/14 0538 06/15/14 0430 06/16/14 0406  WBC 5.1 3.2* 3.7*  --   NEUTROABS 4.6  --   --   --   HGB 8.8* 7.7* 7.7* 7.6*  HCT 26.3* 23.6* 23.3* 23.7*  MCV 92.3 92.9 93.6  --   PLT 194 188 198  --    Cardiac Enzymes: No results for input(s): CKTOTAL, CKMB, CKMBINDEX, TROPONINI in the last 168 hours. BNP: BNP (last 3 results) No results for input(s): BNP in the last 8760 hours.  ProBNP (last 3 results) No results for input(s): PROBNP in the last 8760 hours.  CBG: No results for input(s): GLUCAP in the last 168 hours.     SignedDelfina Redwood  [facility] 06/16/2014, 11:09 AM

## 2014-06-19 LAB — CULTURE, BLOOD (ROUTINE X 2)
Culture: NO GROWTH
Culture: NO GROWTH

## 2014-06-26 ENCOUNTER — Encounter: Payer: Self-pay | Admitting: *Deleted

## 2014-06-26 ENCOUNTER — Encounter: Payer: Medicaid Other | Attending: Nephrology | Admitting: *Deleted

## 2014-06-26 DIAGNOSIS — R634 Abnormal weight loss: Secondary | ICD-10-CM | POA: Insufficient documentation

## 2014-06-26 DIAGNOSIS — Z713 Dietary counseling and surveillance: Secondary | ICD-10-CM | POA: Diagnosis not present

## 2014-06-26 DIAGNOSIS — E639 Nutritional deficiency, unspecified: Secondary | ICD-10-CM | POA: Diagnosis not present

## 2014-06-26 DIAGNOSIS — K3184 Gastroparesis: Secondary | ICD-10-CM | POA: Insufficient documentation

## 2014-06-26 NOTE — Progress Notes (Signed)
  Medical Nutrition Therapy:  Appt start time: 0900 end time:  1000.   Assessment:  Primary concerns today: Tina Watkins is here for nutrition counseling pertaining to gastroparesis and gout.  She struggles with periodic flare-ups and she doesn't know why.  When she has a flare-up, she is afraid to eat for awhile and might not eat for days.  She will have broth and do a liquid diet for a few days.  She is frustrated because she can't seem to find a pattern to her flare-ups and can't identify triggers. She lives alone.  Recently she has lost her appetite.  she does report depression daily, but refuses psychological treatment.  She has lost 8 pounds in the past month due to poor intake.  She was discharged from the hospital 5/20 for dehydration.  She is s/p renal transplant 5 years ago  Preferred Learning Style:   No preference indicated   Learning Readiness:  Ready   MEDICATIONS: see list   DIETARY INTAKE: Usual eating pattern includes 1-2 meals and 0-1 snacks per day.  24-hr recall:  3-4 pm: salad 8-9 pm: lobster ravioli with sauce Beverages: coffee, cherry pepsi, some water  Usual physical activity: ADLs  Estimated energy needs: 1600-1800 calories    Nutritional Diagnosis:  NB-1.1 Food and nutrition-related knowledge deficit As related to nutrition recommendations for GI conditions.  As evidenced by patient self-report knowledge deficit.    Intervention:  Nutrition counseling provided.  Discussed recommendations for gastroparesis: eat small, frequent meals with low fiber and low fat and for gout: avoid foods high in purines  Teaching Method Utilized:  Visual Auditory   Handouts given during visit include:  MNT for low purine diet  MNT for gastroparesis  Barriers to learning/adherence to lifestyle change: untreated depression  Demonstrated degree of understanding via:  Teach Back   Monitoring/Evaluation:  Dietary intake, exercise, and body weight prn.

## 2014-06-29 ENCOUNTER — Encounter (HOSPITAL_COMMUNITY): Payer: Medicaid Other

## 2014-07-06 ENCOUNTER — Encounter (HOSPITAL_COMMUNITY)
Admission: RE | Admit: 2014-07-06 | Discharge: 2014-07-06 | Disposition: A | Discharge status: Home / Self Care | Payer: Medicaid Other | Source: Ambulatory Visit | Attending: Nephrology | Admitting: Nephrology

## 2014-07-06 DIAGNOSIS — N185 Chronic kidney disease, stage 5: Secondary | ICD-10-CM | POA: Diagnosis not present

## 2014-07-06 DIAGNOSIS — Z940 Kidney transplant status: Secondary | ICD-10-CM | POA: Insufficient documentation

## 2014-07-06 DIAGNOSIS — Z5181 Encounter for therapeutic drug level monitoring: Secondary | ICD-10-CM | POA: Diagnosis not present

## 2014-07-06 DIAGNOSIS — D631 Anemia in chronic kidney disease: Secondary | ICD-10-CM | POA: Diagnosis not present

## 2014-07-06 DIAGNOSIS — Z79899 Other long term (current) drug therapy: Secondary | ICD-10-CM | POA: Insufficient documentation

## 2014-07-06 LAB — IRON AND TIBC
Iron: 46 ug/dL (ref 28–170)
Saturation Ratios: 24 % (ref 10.4–31.8)
TIBC: 193 ug/dL — ABNORMAL LOW (ref 250–450)
UIBC: 147 ug/dL

## 2014-07-06 LAB — RENAL FUNCTION PANEL
Albumin: 4 g/dL (ref 3.5–5.0)
Anion gap: 8 (ref 5–15)
BUN: 17 mg/dL (ref 6–20)
CO2: 24 mmol/L (ref 22–32)
Calcium: 9.5 mg/dL (ref 8.9–10.3)
Chloride: 105 mmol/L (ref 101–111)
Creatinine, Ser: 2.18 mg/dL — ABNORMAL HIGH (ref 0.44–1.00)
GFR calc Af Amer: 30 mL/min — ABNORMAL LOW (ref >60)
GFR calc non Af Amer: 26 mL/min — ABNORMAL LOW (ref >60)
Glucose, Bld: 98 mg/dL (ref 65–99)
Phosphorus: 3.8 mg/dL (ref 2.5–4.6)
Potassium: 5.1 mmol/L (ref 3.5–5.1)
Sodium: 137 mmol/L (ref 135–145)

## 2014-07-06 LAB — FERRITIN: Ferritin: 1198 ng/mL — ABNORMAL HIGH (ref 11–307)

## 2014-07-06 LAB — POCT HEMOGLOBIN-HEMACUE: Hemoglobin: 8.3 g/dL — ABNORMAL LOW (ref 12.0–15.0)

## 2014-07-06 MED ORDER — EPOETIN ALFA 40000 UNIT/ML IJ SOLN
INTRAMUSCULAR | Status: AC
  Administered 2014-07-06: 40000 [IU] via SUBCUTANEOUS
  Filled 2014-07-06: qty 1

## 2014-07-06 MED ORDER — EPOETIN ALFA 20000 UNIT/ML IJ SOLN: 40000.0000 [IU] | INTRAMUSCULAR | Status: DC

## 2014-07-25 ENCOUNTER — Other Ambulatory Visit: Payer: Self-pay

## 2014-07-25 DIAGNOSIS — Z1231 Encounter for screening mammogram for malignant neoplasm of breast: Secondary | ICD-10-CM

## 2014-07-30 ENCOUNTER — Encounter (HOSPITAL_COMMUNITY)
Admission: RE | Admit: 2014-07-30 | Discharge: 2014-07-30 | Disposition: A | Discharge status: Home / Self Care | Payer: Medicaid Other | Source: Ambulatory Visit | Attending: Nephrology | Admitting: Nephrology

## 2014-07-30 DIAGNOSIS — N185 Chronic kidney disease, stage 5: Secondary | ICD-10-CM | POA: Diagnosis not present

## 2014-07-30 LAB — RENAL FUNCTION PANEL
Albumin: 4.3 g/dL (ref 3.5–5.0)
Anion gap: 7 (ref 5–15)
BUN: 31 mg/dL — ABNORMAL HIGH (ref 6–20)
CO2: 25 mmol/L (ref 22–32)
Calcium: 10 mg/dL (ref 8.9–10.3)
Chloride: 107 mmol/L (ref 101–111)
Creatinine, Ser: 2.4 mg/dL — ABNORMAL HIGH (ref 0.44–1.00)
GFR calc Af Amer: 27 mL/min — ABNORMAL LOW (ref >60)
GFR calc non Af Amer: 23 mL/min — ABNORMAL LOW (ref >60)
Glucose, Bld: 83 mg/dL (ref 65–99)
Phosphorus: 3.9 mg/dL (ref 2.5–4.6)
Potassium: 3.9 mmol/L (ref 3.5–5.1)
Sodium: 139 mmol/L (ref 135–145)

## 2014-07-30 LAB — POCT HEMOGLOBIN-HEMACUE: Hemoglobin: 9.4 g/dL — ABNORMAL LOW (ref 12.0–15.0)

## 2014-07-30 MED ORDER — EPOETIN ALFA 40000 UNIT/ML IJ SOLN
INTRAMUSCULAR | Status: AC
Start: 2014-07-30 — End: 2014-07-30
  Administered 2014-07-30: 40000 [IU]
  Filled 2014-07-30: qty 1

## 2014-07-30 MED ORDER — EPOETIN ALFA 20000 UNIT/ML IJ SOLN: 40000.0000 [IU] | INTRAMUSCULAR | Status: DC

## 2014-08-25 ENCOUNTER — Other Ambulatory Visit: Payer: Self-pay | Admitting: Gastroenterology

## 2014-08-31 ENCOUNTER — Ambulatory Visit
Admission: RE | Admit: 2014-08-31 | Discharge: 2014-08-31 | Disposition: A | Discharge status: Home / Self Care | Payer: Medicaid Other | Source: Ambulatory Visit

## 2014-08-31 DIAGNOSIS — Z1231 Encounter for screening mammogram for malignant neoplasm of breast: Secondary | ICD-10-CM

## 2014-09-07 ENCOUNTER — Encounter (HOSPITAL_COMMUNITY)
Admission: RE | Admit: 2014-09-07 | Discharge: 2014-09-07 | Disposition: A | Discharge status: Home / Self Care | Payer: Medicaid Other | Source: Ambulatory Visit | Attending: Nephrology | Admitting: Nephrology

## 2014-09-07 DIAGNOSIS — N185 Chronic kidney disease, stage 5: Secondary | ICD-10-CM | POA: Insufficient documentation

## 2014-09-07 DIAGNOSIS — D631 Anemia in chronic kidney disease: Secondary | ICD-10-CM | POA: Diagnosis not present

## 2014-09-07 DIAGNOSIS — Z940 Kidney transplant status: Secondary | ICD-10-CM | POA: Diagnosis not present

## 2014-09-07 DIAGNOSIS — D638 Anemia in other chronic diseases classified elsewhere: Secondary | ICD-10-CM | POA: Diagnosis present

## 2014-09-07 LAB — RENAL FUNCTION PANEL
Albumin: 4.1 g/dL (ref 3.5–5.0)
Anion gap: 8 (ref 5–15)
BUN: 31 mg/dL — ABNORMAL HIGH (ref 6–20)
CO2: 25 mmol/L (ref 22–32)
Calcium: 9.6 mg/dL (ref 8.9–10.3)
Chloride: 107 mmol/L (ref 101–111)
Creatinine, Ser: 2.48 mg/dL — ABNORMAL HIGH (ref 0.44–1.00)
GFR calc Af Amer: 26 mL/min — ABNORMAL LOW (ref >60)
GFR calc non Af Amer: 22 mL/min — ABNORMAL LOW (ref >60)
Glucose, Bld: 84 mg/dL (ref 65–99)
Phosphorus: 3.9 mg/dL (ref 2.5–4.6)
Potassium: 4.7 mmol/L (ref 3.5–5.1)
Sodium: 140 mmol/L (ref 135–145)

## 2014-09-07 LAB — FERRITIN: Ferritin: 1104 ng/mL — ABNORMAL HIGH (ref 11–307)

## 2014-09-07 LAB — IRON AND TIBC
Iron: 85 ug/dL (ref 28–170)
Saturation Ratios: 37 % — ABNORMAL HIGH (ref 10.4–31.8)
TIBC: 228 ug/dL — ABNORMAL LOW (ref 250–450)
UIBC: 143 ug/dL

## 2014-09-07 LAB — POCT HEMOGLOBIN-HEMACUE: Hemoglobin: 9.1 g/dL — ABNORMAL LOW (ref 12.0–15.0)

## 2014-09-07 MED ORDER — EPOETIN ALFA 20000 UNIT/ML IJ SOLN: 40000.0000 [IU] | INTRAMUSCULAR | Status: DC

## 2014-09-07 MED ORDER — EPOETIN ALFA 40000 UNIT/ML IJ SOLN
INTRAMUSCULAR | Status: AC
Start: 2014-09-07 — End: 2014-09-07
  Administered 2014-09-07: 40000 [IU] via SUBCUTANEOUS
  Filled 2014-09-07: qty 1

## 2014-10-04 ENCOUNTER — Other Ambulatory Visit (HOSPITAL_COMMUNITY): Payer: Self-pay | Admitting: *Deleted

## 2014-10-05 ENCOUNTER — Encounter (HOSPITAL_COMMUNITY)
Admission: RE | Admit: 2014-10-05 | Discharge: 2014-10-05 | Disposition: A | Discharge status: Home / Self Care | Payer: Medicaid Other | Source: Ambulatory Visit | Attending: Nephrology | Admitting: Nephrology

## 2014-10-05 DIAGNOSIS — Z940 Kidney transplant status: Secondary | ICD-10-CM | POA: Insufficient documentation

## 2014-10-05 DIAGNOSIS — D638 Anemia in other chronic diseases classified elsewhere: Secondary | ICD-10-CM | POA: Diagnosis present

## 2014-10-05 DIAGNOSIS — D631 Anemia in chronic kidney disease: Secondary | ICD-10-CM | POA: Diagnosis not present

## 2014-10-05 DIAGNOSIS — N185 Chronic kidney disease, stage 5: Secondary | ICD-10-CM | POA: Insufficient documentation

## 2014-10-05 LAB — RENAL FUNCTION PANEL
Albumin: 4.1 g/dL (ref 3.5–5.0)
Anion gap: 6 (ref 5–15)
BUN: 23 mg/dL — ABNORMAL HIGH (ref 6–20)
CO2: 24 mmol/L (ref 22–32)
Calcium: 9.5 mg/dL (ref 8.9–10.3)
Chloride: 108 mmol/L (ref 101–111)
Creatinine, Ser: 2.38 mg/dL — ABNORMAL HIGH (ref 0.44–1.00)
GFR calc Af Amer: 27 mL/min — ABNORMAL LOW (ref >60)
GFR calc non Af Amer: 23 mL/min — ABNORMAL LOW (ref >60)
Glucose, Bld: 109 mg/dL — ABNORMAL HIGH (ref 65–99)
Phosphorus: 4.2 mg/dL (ref 2.5–4.6)
Potassium: 5.2 mmol/L — ABNORMAL HIGH (ref 3.5–5.1)
Sodium: 138 mmol/L (ref 135–145)

## 2014-10-05 LAB — POCT HEMOGLOBIN-HEMACUE: Hemoglobin: 9.6 g/dL — ABNORMAL LOW (ref 12.0–15.0)

## 2014-10-05 MED ORDER — EPOETIN ALFA 20000 UNIT/ML IJ SOLN: 40000.0000 [IU] | INTRAMUSCULAR | Status: DC

## 2014-10-05 MED ORDER — EPOETIN ALFA 40000 UNIT/ML IJ SOLN
INTRAMUSCULAR | Status: AC
  Administered 2014-10-05: 40000 [IU] via SUBCUTANEOUS
  Filled 2014-10-05: qty 1

## 2014-10-15 ENCOUNTER — Emergency Department (HOSPITAL_COMMUNITY)
Admission: EM | Admit: 2014-10-15 | Discharge: 2014-10-15 | Disposition: A | Discharge status: Home / Self Care | Payer: Medicaid Other | Attending: Emergency Medicine | Admitting: Emergency Medicine

## 2014-10-15 ENCOUNTER — Encounter (HOSPITAL_COMMUNITY): Payer: Self-pay | Admitting: *Deleted

## 2014-10-15 DIAGNOSIS — R1084 Generalized abdominal pain: Secondary | ICD-10-CM | POA: Diagnosis not present

## 2014-10-15 DIAGNOSIS — N186 End stage renal disease: Secondary | ICD-10-CM | POA: Insufficient documentation

## 2014-10-15 DIAGNOSIS — Z8619 Personal history of other infectious and parasitic diseases: Secondary | ICD-10-CM | POA: Insufficient documentation

## 2014-10-15 DIAGNOSIS — R112 Nausea with vomiting, unspecified: Secondary | ICD-10-CM | POA: Diagnosis not present

## 2014-10-15 DIAGNOSIS — Z940 Kidney transplant status: Secondary | ICD-10-CM | POA: Insufficient documentation

## 2014-10-15 DIAGNOSIS — Z79899 Other long term (current) drug therapy: Secondary | ICD-10-CM | POA: Diagnosis not present

## 2014-10-15 DIAGNOSIS — Z8673 Personal history of transient ischemic attack (TIA), and cerebral infarction without residual deficits: Secondary | ICD-10-CM | POA: Diagnosis not present

## 2014-10-15 DIAGNOSIS — K219 Gastro-esophageal reflux disease without esophagitis: Secondary | ICD-10-CM | POA: Diagnosis not present

## 2014-10-15 DIAGNOSIS — Z720 Tobacco use: Secondary | ICD-10-CM | POA: Insufficient documentation

## 2014-10-15 DIAGNOSIS — G8929 Other chronic pain: Secondary | ICD-10-CM | POA: Insufficient documentation

## 2014-10-15 DIAGNOSIS — Z862 Personal history of diseases of the blood and blood-forming organs and certain disorders involving the immune mechanism: Secondary | ICD-10-CM | POA: Insufficient documentation

## 2014-10-15 DIAGNOSIS — Z7982 Long term (current) use of aspirin: Secondary | ICD-10-CM | POA: Diagnosis not present

## 2014-10-15 DIAGNOSIS — Z7952 Long term (current) use of systemic steroids: Secondary | ICD-10-CM | POA: Diagnosis not present

## 2014-10-15 DIAGNOSIS — F329 Major depressive disorder, single episode, unspecified: Secondary | ICD-10-CM | POA: Diagnosis not present

## 2014-10-15 DIAGNOSIS — G40909 Epilepsy, unspecified, not intractable, without status epilepticus: Secondary | ICD-10-CM | POA: Insufficient documentation

## 2014-10-15 DIAGNOSIS — M109 Gout, unspecified: Secondary | ICD-10-CM | POA: Insufficient documentation

## 2014-10-15 LAB — COMPREHENSIVE METABOLIC PANEL
ALT: 14 U/L (ref 14–54)
AST: 27 U/L (ref 15–41)
Albumin: 5 g/dL (ref 3.5–5.0)
Alkaline Phosphatase: 89 U/L (ref 38–126)
Anion gap: 16 — ABNORMAL HIGH (ref 5–15)
BUN: 22 mg/dL — ABNORMAL HIGH (ref 6–20)
CO2: 19 mmol/L — ABNORMAL LOW (ref 22–32)
Calcium: 10.4 mg/dL — ABNORMAL HIGH (ref 8.9–10.3)
Chloride: 105 mmol/L (ref 101–111)
Creatinine, Ser: 2.11 mg/dL — ABNORMAL HIGH (ref 0.44–1.00)
GFR calc Af Amer: 31 mL/min — ABNORMAL LOW (ref >60)
GFR calc non Af Amer: 27 mL/min — ABNORMAL LOW (ref >60)
Glucose, Bld: 85 mg/dL (ref 65–99)
Potassium: 4.2 mmol/L (ref 3.5–5.1)
Sodium: 140 mmol/L (ref 135–145)
Total Bilirubin: 1.4 mg/dL — ABNORMAL HIGH (ref 0.3–1.2)
Total Protein: 7.6 g/dL (ref 6.5–8.1)

## 2014-10-15 LAB — CBC
HCT: 34.4 % — ABNORMAL LOW (ref 36.0–46.0)
Hemoglobin: 11.3 g/dL — ABNORMAL LOW (ref 12.0–15.0)
MCH: 31.7 pg (ref 26.0–34.0)
MCHC: 32.8 g/dL (ref 30.0–36.0)
MCV: 96.4 fL (ref 78.0–100.0)
Platelets: 159 10*3/uL (ref 150–400)
RBC: 3.57 MIL/uL — ABNORMAL LOW (ref 3.87–5.11)
RDW: 19.6 % — ABNORMAL HIGH (ref 11.5–15.5)
WBC: 4 10*3/uL (ref 4.0–10.5)

## 2014-10-15 LAB — URINALYSIS, ROUTINE W REFLEX MICROSCOPIC
Bilirubin Urine: NEGATIVE
Glucose, UA: NEGATIVE mg/dL
Ketones, ur: 15 mg/dL — AB
Leukocytes, UA: NEGATIVE
Nitrite: NEGATIVE
Protein, ur: 30 mg/dL — AB
Specific Gravity, Urine: 1.012 (ref 1.005–1.030)
Urobilinogen, UA: 0.2 mg/dL (ref 0.0–1.0)
pH: 7.5 (ref 5.0–8.0)

## 2014-10-15 LAB — I-STAT VENOUS BLOOD GAS, ED
Acid-Base Excess: 1 mmol/L (ref 0.0–2.0)
Bicarbonate: 22.5 mEq/L (ref 20.0–24.0)
O2 Saturation: 92 %
TCO2: 23 mmol/L (ref 0–100)
pCO2, Ven: 26.2 mmHg — ABNORMAL LOW (ref 45.0–50.0)
pH, Ven: 7.542 — ABNORMAL HIGH (ref 7.250–7.300)
pO2, Ven: 55 mmHg — ABNORMAL HIGH (ref 30.0–45.0)

## 2014-10-15 LAB — LIPASE, BLOOD: Lipase: 26 U/L (ref 22–51)

## 2014-10-15 LAB — URINE MICROSCOPIC-ADD ON

## 2014-10-15 LAB — I-STAT CG4 LACTIC ACID, ED: Lactic Acid, Venous: 2.89 mmol/L (ref 0.5–2.0)

## 2014-10-15 MED ORDER — HYDROMORPHONE HCL 1 MG/ML IJ SOLN
0.5000 mg | Freq: Once | INTRAMUSCULAR | Status: AC
  Administered 2014-10-15: 0.5 mg via INTRAVENOUS
  Filled 2014-10-15: qty 1

## 2014-10-15 MED ORDER — METOCLOPRAMIDE HCL 5 MG/ML IJ SOLN
10.0000 mg | Freq: Once | INTRAMUSCULAR | Status: AC
  Administered 2014-10-15: 10 mg via INTRAVENOUS
  Filled 2014-10-15: qty 2

## 2014-10-15 MED ORDER — HYDROMORPHONE HCL 1 MG/ML IJ SOLN
1.0000 mg | Freq: Once | INTRAMUSCULAR | Status: AC
  Administered 2014-10-15: 1 mg via INTRAVENOUS
  Filled 2014-10-15: qty 1

## 2014-10-15 MED ORDER — SODIUM CHLORIDE 0.9 % IV BOLUS (SEPSIS)
1000.0000 mL | Freq: Once | INTRAVENOUS | Status: AC
  Administered 2014-10-15: 1000 mL via INTRAVENOUS

## 2014-10-15 NOTE — ED Provider Notes (Signed)
CSN: [id number]     Arrival date & time 10/15/14  [id number] History   First MD Initiated Contact with Patient 10/15/14 0741     Chief Complaint  Patient presents with  . Emesis     (Consider location/radiation/quality/duration/timing/severity/associated sxs/prior Treatment) HPI  Patient presents with concern of abdominal pain, nausea, vomiting. Symptoms began yesterday without clear precipitant. Since onset symptoms of been persistent. There is diffuse sore, severe, crampy abdominal pain. No relief with OTC medication or narcotics. Nausea is persistent, anorexia is persistent. She has been intolerant of all oral intake, both liquid and solid. There is no new fever, confusion, disorientation, chest pain. Patient states the pain is similar to that she is exposed return prior episodes of gastroparesis. No change in bowel movements.   Past Medical History  Diagnosis Date  . Gastroparesis   . Gout   . Hypotension   . Herpes genitalia 1994  . E coli bacteremia 06/18/2011  . Bacteremia due to Gram-negative bacteria 05/23/2011  . History of blood transfusion     "more than a few times" (12/11/2011)  . History of stomach ulcers   . Stroke      left basal ganglia lacunar infarct; Right frontal lobe lacunar infarct.  . Stroke ~ 1999; 2001    "briefly lost my vision; lost my right eye" (12/11/2011)  . Chronic lower back pain   . Depression   . Osteopenia   . Headache(784.0)     "not often anymore" (12/11/2011)  . Complication of anesthesia     "woke up during OR; I have an extremely high tolerance" (12/11/2011)  . Dysrhythmia     "tachycardia" (12/11/2011)  . ESRD (end stage renal disease) 06/12/2011  . Iron deficiency anemia   . Seizures 1994    "post transplant; only have had that one" (12/11/2011)  . GERD (gastroesophageal reflux disease)   . DDD (degenerative disc disease), cervical    Past Surgical History  Procedure Laterality Date  . Kidney transplant  1994; 1999; 2005     "right"  . Appendectomy  ~ 2004  . 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 Total nephrectomy  1988?; 1994; 2005  . Multiple tooth extractions    . Enucleation  2001    "right"  . Esophagogastroduodenoscopy (egd) with propofol N/A 04/21/2012    Procedure: ESOPHAGOGASTRODUODENOSCOPY (EGD) WITH PROPOFOL;  Surgeon: Milus Banister, MD;  Location: [facility];  Service: Endoscopy;  Laterality: N/A;   Family History  Problem Relation Age of Onset  . Hypertension Maternal Grandmother    Social History  Substance Use Topics  . Smoking status: Current Some Day Smoker -- 0.50 packs/day for 28 years    Types: Cigarettes  . Smokeless tobacco: Never Used     Comment: had quit 03/05/2011  . Alcohol Use: Yes     Comment: uses daily   OB History    No data available     Review of Systems  Constitutional:       Per HPI, otherwise negative  HENT:       Per HPI, otherwise negative  Respiratory:       Per HPI, otherwise negative  Cardiovascular:       Per HPI, otherwise negative  Gastrointestinal: Positive for nausea, vomiting and abdominal pain.  Endocrine:       Negative aside from HPI  Genitourinary:       Neg  aside from HPI   Musculoskeletal:       Per HPI, otherwise negative  Skin: Negative.   Neurological: Negative for syncope.      Allergies  Levofloxacin  Home Medications   Prior to Admission medications   Medication Sig Start Date End Date Taking? Authorizing Provider  aspirin EC 81 MG tablet Take 81 mg by mouth daily.   Yes Historical Provider, MD  azaTHIOprine (IMURAN) 50 MG tablet Take 100 mg by mouth daily.    Yes Historical Provider, MD  cephALEXin (KEFLEX) 500 MG capsule Take 1 capsule (500 mg total) by mouth 2 (two) times daily. Until gone 06/16/14  Yes Delfina Redwood, MD  colchicine 0.6 MG tablet Take 0.6 mg by mouth daily as needed (For gout.).   Yes Historical Provider, MD  diphenhydrAMINE  (SOMINEX) 25 MG tablet Take 25 mg by mouth at bedtime as needed for sleep.   Yes Historical Provider, MD  furosemide (LASIX) 40 MG tablet Take 40 mg by mouth 2 (two) times daily.  06/22/11  Yes [name], MD  HYDROcodone-acetaminophen (NORCO) 10-325 MG per tablet Take 1 tablet by mouth every 6 (six) hours as needed for moderate pain.  01/09/14  Yes Historical Provider, MD  metoCLOPramide (REGLAN) 5 MG tablet TAKE 1 TABLET (5 MG TOTAL) BY MOUTH AT BEDTIME AND MAY REPEAT DOSE ONE TIME IF NEEDED. 08/27/14  Yes Milus Banister, MD  Multiple Vitamin (MULTIVITAMIN) tablet Take 1 tablet by mouth daily.   Yes Historical Provider, MD  NEOMYCIN-POLYMYXIN-HYDROCORTISONE (CORTISPORIN) 1 % SOLN otic solution 2 drops by Other route 2 (two) times daily. toes 10/03/14  Yes Historical Provider, MD  omeprazole (PRILOSEC) 40 MG capsule Take 40 mg by mouth 2 (two) times daily.    Yes Historical Provider, MD  predniSONE (DELTASONE) 5 MG tablet Take 5 mg by mouth daily. 06/22/11  Yes [name], MD  tacrolimus (PROGRAF) 1 MG capsule Take 3 mg by mouth 2 (two) times daily.  06/22/11  Yes [name], MD  triamcinolone cream (KENALOG) 0.1 % Apply 1 application topically as needed.  02/20/14  Yes Historical Provider, MD  zolpidem (AMBIEN) 10 MG tablet Take 10 mg by mouth at bedtime as needed for sleep.   Yes Historical Provider, MD  acetaminophen (TYLENOL) 325 MG tablet Take 2 tablets (650 mg total) by mouth every 6 (six) hours as needed for mild pain (or Fever >/= 101). Patient not taking: Reported on 06/26/2014 06/16/14   Delfina Redwood, MD  cyclobenzaprine (FLEXERIL) 5 MG tablet Take 1 tablet (5 mg total) by mouth 3 (three) times daily as needed for muscle spasms. Patient not taking: Reported on 10/15/2014 06/16/14   Delfina Redwood, MD  Darbepoetin Alfa-Albumin (ARANESP IJ) Inject as directed every 30 (thirty) days.     Historical Provider, MD  dicyclomine (BENTYL) 20 MG tablet Take 1 tablet (20 mg  total) by mouth 4 (four) times daily -  before meals and at bedtime. Patient not taking: Reported on 06/26/2014 01/25/14   Orlie Dakin, MD  gabapentin (NEURONTIN) 300 MG capsule Take 300 mg by mouth at bedtime.     Historical Provider, MD  hydrocortisone (ANUSOL-HC) 25 MG suppository Place 1 suppository (25 mg total) rectally 2 (two) times daily. Patient not taking: Reported on 06/26/2014 06/16/14   Delfina Redwood, MD  nystatin (MYCOSTATIN) 100000 UNIT/ML suspension Take 5 mLs (500,000 Units total) by mouth 4 (four) times daily. Patient not taking: Reported on 06/26/2014 09/13/13  Milus Banister, MD  ondansetron (ZOFRAN-ODT) 4 MG disintegrating tablet Take 1 tablet (4 mg total) by mouth every 6 (six) hours. Patient not taking: Reported on 10/15/2014 05/18/13   Amy S Esterwood, PA-C  oxyCODONE (ROXICODONE) 5 MG immediate release tablet Take 1 tab daily as needed for pain. Patient not taking: Reported on 06/26/2014 05/18/13   Amy S Esterwood, PA-C  oxyCODONE-acetaminophen (PERCOCET/ROXICET) 5-325 MG per tablet Take 1-2 tablets by mouth every 6 (six) hours as needed for severe pain. Patient not taking: Reported on 10/15/2014 04/06/14   Hyman Bible, PA-C  promethazine (PHENERGAN) 25 MG suppository Place 1 suppository (25 mg total) rectally every 6 (six) hours as needed for nausea or vomiting. Patient not taking: Reported on 10/15/2014 05/18/13   Amy S Esterwood, PA-C  promethazine (PHENERGAN) 25 MG tablet Take 1 tablet (25 mg total) by mouth every 6 (six) hours as needed for nausea or vomiting. Patient not taking: Reported on 10/15/2014 05/07/13   Margarita Mail, PA-C  valACYclovir (VALTREX) 500 MG tablet Take 1 tablet (500 mg total) by mouth 2 (two) times daily. Until gone Patient not taking: Reported on 10/15/2014 06/16/14   Delfina Redwood, MD   BP 112/69 mmHg  Pulse 64  Temp(Src) 98.3 F (36.8 C) (Oral)  Resp 16  SpO2 100% Physical Exam  Constitutional: She is oriented to person, place, and  time. She appears ill. She appears distressed.  HENT:  Head: Normocephalic and atraumatic.  Eyes: Conjunctivae and EOM are normal.  Cardiovascular: Normal rate and regular rhythm.   Pulmonary/Chest: Effort normal and breath sounds normal. No stridor. No respiratory distress.  Abdominal: She exhibits no distension. There is generalized tenderness. There is guarding. There is no rigidity and no CVA tenderness.  Musculoskeletal: She exhibits no edema.  Neurological: She is alert and oriented to person, place, and time. No cranial nerve deficit.  Skin: Skin is warm. She is diaphoretic.  Psychiatric: She has a normal mood and affect.  Nursing note and vitals reviewed.   ED Course  Procedures (including critical care time) Labs Review Labs Reviewed  COMPREHENSIVE METABOLIC PANEL - Abnormal; Notable for the following:    CO2 19 (*)    BUN 22 (*)    Creatinine, Ser 2.11 (*)    Calcium 10.4 (*)    Total Bilirubin 1.4 (*)    GFR calc non Af Amer 27 (*)    GFR calc Af Amer 31 (*)    Anion gap 16 (*)    All other components within normal limits  CBC - Abnormal; Notable for the following:    RBC 3.57 (*)    Hemoglobin 11.3 (*)    HCT 34.4 (*)    RDW 19.6 (*)    All other components within normal limits  URINALYSIS, ROUTINE W REFLEX MICROSCOPIC (NOT AT [facility]) - Abnormal; Notable for the following:    Hgb urine dipstick TRACE (*)    Ketones, ur 15 (*)    Protein, ur 30 (*)    All other components within normal limits  URINE MICROSCOPIC-ADD ON - Abnormal; Notable for the following:    Squamous Epithelial / LPF FEW (*)    All other components within normal limits  I-STAT CG4 LACTIC ACID, ED - Abnormal; Notable for the following:    Lactic Acid, Venous 2.89 (*)    All other components within normal limits  I-STAT VENOUS BLOOD GAS, ED - Abnormal; Notable for the following:    pH, Ven 7.542 (*)  pCO2, Ven 26.2 (*)    pO2, Ven 55.0 (*)    All other components within normal limits   LIPASE, BLOOD  BLOOD GAS, VENOUS    Imaging Review No results found. I have personally reviewed and evaluated these images and lab results as part of my medical decision-making.   EKG Interpretation   Date/Time:  Monday October 15 2014 07:46:10 EDT Ventricular Rate:  147 PR Interval:    QRS Duration: 70 QT Interval:  343 QTC Calculation: 536 R Axis:   64 Text Interpretation:  Atrial fibrillation Abnormal R-wave progression,  early transition Repol abnrm suggests ischemia, inferior leads Minimal ST  elevation, lateral leads Prolonged QT interval Atrial fibrillation , new  ST-t wave abnormality Artifact Abnormal ekg Confirmed by Carmin Muskrat   MD [phone]) on 10/15/2014 7:50:35 AM     Following initial resuscitation with 2 L IV fluid, antiemetics, analgesia, the patient is requesting ginger ale.  Patient has history of gastroparesis, chart review demonstrates CT evaluation within the past few months, unremarkable.  2:33 PM Patient has substantially better, heart rate has diminished, and she is tolerating oral rehydration.  MDM  Patient with multiple medical issues, including gastroparesis presents with nausea, vomiting, abdominal pain. Patient has diffuse abdominal pain, though no peritoneal findings. Patient is completely intolerant of oral intake initially, but improves substantially with multiple liters of fluid resuscitation, antiemetics, analgesia. With her improvement, she was discharged in stable condition. With no peritoneal findings, no fever, low suspicion for acute new intra-abdominal process. No evidence for obstruction.   Carmin Muskrat, MD 10/15/14 1434

## 2014-10-15 NOTE — Discharge Instructions (Signed)
As discussed, it is important that you follow up as soon as possible with your physician for continued management of your condition. ° °If you develop any new, or concerning changes in your condition, please return to the emergency department immediately. ° °

## 2014-10-15 NOTE — ED Notes (Signed)
Pt reports vomiting and abdominal pain that started yesterday. Pt states "i'm having a gastroparesis flare up".

## 2014-11-09 ENCOUNTER — Encounter (HOSPITAL_COMMUNITY)
Admission: RE | Admit: 2014-11-09 | Discharge: 2014-11-09 | Disposition: A | Discharge status: Home / Self Care | Payer: Medicaid Other | Source: Ambulatory Visit | Attending: Nephrology | Admitting: Nephrology

## 2014-11-09 DIAGNOSIS — N185 Chronic kidney disease, stage 5: Secondary | ICD-10-CM | POA: Insufficient documentation

## 2014-11-09 DIAGNOSIS — D631 Anemia in chronic kidney disease: Secondary | ICD-10-CM | POA: Diagnosis not present

## 2014-11-09 DIAGNOSIS — Z940 Kidney transplant status: Secondary | ICD-10-CM | POA: Diagnosis not present

## 2014-11-09 DIAGNOSIS — D638 Anemia in other chronic diseases classified elsewhere: Secondary | ICD-10-CM | POA: Diagnosis present

## 2014-11-09 LAB — RENAL FUNCTION PANEL
Albumin: 4 g/dL (ref 3.5–5.0)
Anion gap: 8 (ref 5–15)
BUN: 28 mg/dL — ABNORMAL HIGH (ref 6–20)
CO2: 25 mmol/L (ref 22–32)
Calcium: 9.8 mg/dL (ref 8.9–10.3)
Chloride: 109 mmol/L (ref 101–111)
Creatinine, Ser: 2.17 mg/dL — ABNORMAL HIGH (ref 0.44–1.00)
GFR calc Af Amer: 30 mL/min — ABNORMAL LOW (ref >60)
GFR calc non Af Amer: 26 mL/min — ABNORMAL LOW (ref >60)
Glucose, Bld: 81 mg/dL (ref 65–99)
Phosphorus: 3.5 mg/dL (ref 2.5–4.6)
Potassium: 4.9 mmol/L (ref 3.5–5.1)
Sodium: 142 mmol/L (ref 135–145)

## 2014-11-09 LAB — IRON AND TIBC
Iron: 74 ug/dL (ref 28–170)
Saturation Ratios: 34 % — ABNORMAL HIGH (ref 10.4–31.8)
TIBC: 216 ug/dL — ABNORMAL LOW (ref 250–450)
UIBC: 142 ug/dL

## 2014-11-09 LAB — POCT HEMOGLOBIN-HEMACUE: Hemoglobin: 9.6 g/dL — ABNORMAL LOW (ref 12.0–15.0)

## 2014-11-09 LAB — FERRITIN: Ferritin: 1112 ng/mL — ABNORMAL HIGH (ref 11–307)

## 2014-11-09 MED ORDER — EPOETIN ALFA 20000 UNIT/ML IJ SOLN: 40000.0000 [IU] | INTRAMUSCULAR | Status: DC

## 2014-11-09 MED ORDER — EPOETIN ALFA 40000 UNIT/ML IJ SOLN
INTRAMUSCULAR | Status: AC
  Administered 2014-11-09: 40000 [IU] via SUBCUTANEOUS
  Filled 2014-11-09: qty 1

## 2014-12-07 ENCOUNTER — Encounter (HOSPITAL_COMMUNITY)
Admission: RE | Admit: 2014-12-07 | Discharge: 2014-12-07 | Disposition: A | Discharge status: Home / Self Care | Payer: Medicaid Other | Source: Ambulatory Visit | Attending: Nephrology | Admitting: Nephrology

## 2014-12-07 DIAGNOSIS — D631 Anemia in chronic kidney disease: Secondary | ICD-10-CM | POA: Diagnosis not present

## 2014-12-07 DIAGNOSIS — Z940 Kidney transplant status: Secondary | ICD-10-CM | POA: Diagnosis not present

## 2014-12-07 DIAGNOSIS — N185 Chronic kidney disease, stage 5: Secondary | ICD-10-CM | POA: Diagnosis not present

## 2014-12-07 DIAGNOSIS — D638 Anemia in other chronic diseases classified elsewhere: Secondary | ICD-10-CM | POA: Diagnosis present

## 2014-12-07 LAB — POCT HEMOGLOBIN-HEMACUE: Hemoglobin: 9.8 g/dL — ABNORMAL LOW (ref 12.0–15.0)

## 2014-12-07 LAB — RENAL FUNCTION PANEL
Albumin: 4.1 g/dL (ref 3.5–5.0)
Anion gap: 6 (ref 5–15)
BUN: 46 mg/dL — ABNORMAL HIGH (ref 6–20)
CO2: 24 mmol/L (ref 22–32)
Calcium: 9.9 mg/dL (ref 8.9–10.3)
Chloride: 110 mmol/L (ref 101–111)
Creatinine, Ser: 2.85 mg/dL — ABNORMAL HIGH (ref 0.44–1.00)
GFR calc Af Amer: 22 mL/min — ABNORMAL LOW (ref >60)
GFR calc non Af Amer: 19 mL/min — ABNORMAL LOW (ref >60)
Glucose, Bld: 85 mg/dL (ref 65–99)
Phosphorus: 3.3 mg/dL (ref 2.5–4.6)
Potassium: 4.5 mmol/L (ref 3.5–5.1)
Sodium: 140 mmol/L (ref 135–145)

## 2014-12-07 MED ORDER — EPOETIN ALFA 20000 UNIT/ML IJ SOLN: 40000.0000 [IU] | INTRAMUSCULAR | Status: DC

## 2014-12-07 MED ORDER — EPOETIN ALFA 40000 UNIT/ML IJ SOLN
INTRAMUSCULAR | Status: AC
  Administered 2014-12-07: 40000 [IU]
  Filled 2014-12-07: qty 1

## 2014-12-13 ENCOUNTER — Other Ambulatory Visit: Payer: Self-pay | Admitting: Neurosurgery

## 2014-12-21 ENCOUNTER — Other Ambulatory Visit (HOSPITAL_COMMUNITY): Payer: Medicaid Other
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
## 2015-01-03 ENCOUNTER — Encounter (HOSPITAL_COMMUNITY)
Admission: RE | Admit: 2015-01-03 | Discharge: 2015-01-03 | Disposition: A | Discharge status: Home / Self Care | Payer: Medicaid Other | Source: Ambulatory Visit | Attending: Nephrology | Admitting: Nephrology

## 2015-01-03 DIAGNOSIS — D638 Anemia in other chronic diseases classified elsewhere: Secondary | ICD-10-CM | POA: Diagnosis present

## 2015-01-03 DIAGNOSIS — N185 Chronic kidney disease, stage 5: Secondary | ICD-10-CM | POA: Insufficient documentation

## 2015-01-03 DIAGNOSIS — Z940 Kidney transplant status: Secondary | ICD-10-CM | POA: Insufficient documentation

## 2015-01-03 DIAGNOSIS — D631 Anemia in chronic kidney disease: Secondary | ICD-10-CM | POA: Insufficient documentation

## 2015-01-03 LAB — RENAL FUNCTION PANEL
Albumin: 4 g/dL (ref 3.5–5.0)
Anion gap: 8 (ref 5–15)
BUN: 25 mg/dL — ABNORMAL HIGH (ref 6–20)
CO2: 24 mmol/L (ref 22–32)
Calcium: 9.6 mg/dL (ref 8.9–10.3)
Chloride: 108 mmol/L (ref 101–111)
Creatinine, Ser: 2.23 mg/dL — ABNORMAL HIGH (ref 0.44–1.00)
GFR calc Af Amer: 29 mL/min — ABNORMAL LOW (ref >60)
GFR calc non Af Amer: 25 mL/min — ABNORMAL LOW (ref >60)
Glucose, Bld: 76 mg/dL (ref 65–99)
Phosphorus: 3.8 mg/dL (ref 2.5–4.6)
Potassium: 4.3 mmol/L (ref 3.5–5.1)
Sodium: 140 mmol/L (ref 135–145)

## 2015-01-03 LAB — IRON AND TIBC
Iron: 53 ug/dL (ref 28–170)
Saturation Ratios: 27 % (ref 10.4–31.8)
TIBC: 195 ug/dL — ABNORMAL LOW (ref 250–450)
UIBC: 142 ug/dL

## 2015-01-03 LAB — POCT HEMOGLOBIN-HEMACUE: Hemoglobin: 9.2 g/dL — ABNORMAL LOW (ref 12.0–15.0)

## 2015-01-03 LAB — FERRITIN: Ferritin: 909 ng/mL — ABNORMAL HIGH (ref 11–307)

## 2015-01-03 MED ORDER — EPOETIN ALFA 40000 UNIT/ML IJ SOLN
INTRAMUSCULAR | Status: AC
  Administered 2015-01-03: 40000 [IU]
  Filled 2015-01-03: qty 1

## 2015-01-03 MED ORDER — EPOETIN ALFA 20000 UNIT/ML IJ SOLN: 40000.0000 [IU] | INTRAMUSCULAR | Status: DC

## 2015-01-04 ENCOUNTER — Other Ambulatory Visit (HOSPITAL_COMMUNITY): Payer: Medicaid Other

## 2015-01-04 ENCOUNTER — Encounter (HOSPITAL_COMMUNITY): Payer: Medicaid Other

## 2015-01-07 ENCOUNTER — Encounter (HOSPITAL_COMMUNITY): Payer: Self-pay | Admitting: *Deleted

## 2015-01-07 MED ORDER — CEFAZOLIN SODIUM-DEXTROSE 2-3 GM-% IV SOLR
2.0000 g | INTRAVENOUS | Status: AC
  Administered 2015-01-08: 2 g via INTRAVENOUS
  Filled 2015-01-07: qty 50

## 2015-01-07 NOTE — H&P (Signed)
Tina Watkins is an 49 y.o. female.   Chief Complaint:neck pain [id number] with neck pain for almost one year which goes to the shoulders specially with activity.the pain is getting worse and a mri neck was done and send to [name] for evaluation  Past Medical History  Diagnosis Date  . Gastroparesis   . Gout   . Hypotension   . Herpes genitalia 1994  . E coli bacteremia 06/18/2011  . Bacteremia due to Gram-negative bacteria 05/23/2011  . History of blood transfusion     "more than a few times" (12/11/2011)  . History of stomach ulcers   . Stroke      left basal ganglia lacunar infarct; Right frontal lobe lacunar infarct.  . Stroke ~ 1999; 2001    "briefly lost my vision; lost my right eye" (12/11/2011)  . Chronic lower back pain   . Depression   . Osteopenia   . Headache(784.0)     "not often anymore" (12/11/2011)  . Complication of anesthesia     "woke up during OR; I have an extremely high tolerance" (12/11/2011)  . Dysrhythmia     "tachycardia" (12/11/2011)  . ESRD (end stage renal disease) 06/12/2011  . Iron deficiency anemia   . Seizures 1994    "post transplant; only have had that one" (12/11/2011)  . GERD (gastroesophageal reflux disease)   . DDD (degenerative disc disease), cervical     Past Surgical History  Procedure Laterality Date  . Kidney transplant  1994; 1999; 2005    "right"  . Appendectomy  ~ 2004  . 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 Total nephrectomy  1988?; 1994; 2005  . Multiple tooth extractions    . Enucleation  2001    "right"  . Esophagogastroduodenoscopy (egd) with propofol N/A 04/21/2012    Procedure: ESOPHAGOGASTRODUODENOSCOPY (EGD) WITH PROPOFOL;  Surgeon: Milus Banister, MD;  Location: [facility];  Service: Endoscopy;  Laterality: N/A;    Family History  Problem Relation Age of Onset  . Hypertension Maternal Grandmother    Social History:  reports that she has been  smoking Cigarettes.  She has a 14 pack-year smoking history. She has never used smokeless tobacco. She reports that she drinks alcohol. She reports that she uses illicit drugs (Marijuana).  Allergies:  Allergies  Allergen Reactions  . Levofloxacin Itching and Rash    No prescriptions prior to admission    No results found for this or any previous visit (from the past 48 hour(s)). No results found.  Review of Systems  Constitutional: Negative.   Eyes: Negative for blurred vision.  Respiratory: Negative.   Cardiovascular: Negative.   Genitourinary: Negative.   Musculoskeletal: Positive for neck pain.  Neurological: Positive for sensory change and speech change.  Endo/Heme/Allergies: Negative.   Psychiatric/Behavioral: Positive for depression and memory loss.    There were no vitals taken for this visit. Physical Exam hent,nl. Neck, pain with mobility.lungs, some ronchii. Cv, nl. Abdomen, nl. Extremities, nl. 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 Weakness of the deltoids. Sensory, nl.mri cervical shows ddd wiith stenosis at [phone]  Assessment/Plan Patient wants to go ahead with surgery which will be a decompression and fusion at c34, 45. She is aware of risks and benefits Dewell Monnier M 01/07/2015, 3:13 PM

## 2015-01-07 NOTE — Progress Notes (Signed)
After reviewing pt EKG's dated 10/15/14, Dr. Tobias Alexander stated that pt has a new onset of afib; MD advised that surgeon be notified  of  " new onset of Afib, the possibility that pt surgery may be canceled"  and repeat EKG on DOS. Spoke with Anderson Malta, RN who made Dr. Saintclair Halsted ( on call for Dr. Joya Salm) aware.

## 2015-01-07 NOTE — Progress Notes (Addendum)
Pt denies SOB, chest pain, and being under the care of a cardiologist. Pt stated that she had a stent placed in the 80's but is not sure of which artery.  Pt made aware to stop taking otc vitamins, fish oil, and herbal medications. Do not take any NSAIDs ie: Ibuprofen, Advil, Naproxen , BC's and Goody's.. Pt verbalized understanding of all pre-op instructions.

## 2015-01-08 ENCOUNTER — Ambulatory Visit (HOSPITAL_COMMUNITY): Payer: Medicaid Other | Admitting: Anesthesiology

## 2015-01-08 ENCOUNTER — Encounter (HOSPITAL_COMMUNITY): Payer: Self-pay | Admitting: *Deleted

## 2015-01-08 ENCOUNTER — Ambulatory Visit (HOSPITAL_COMMUNITY): Payer: Medicaid Other

## 2015-01-08 ENCOUNTER — Encounter (HOSPITAL_COMMUNITY)
Admission: RE | Disposition: A | Discharge status: Home / Self Care | Payer: Self-pay | Source: Ambulatory Visit | Attending: Neurosurgery

## 2015-01-08 ENCOUNTER — Observation Stay (HOSPITAL_COMMUNITY)
Admission: RE | Admit: 2015-01-08 | Discharge: 2015-01-10 | DRG: 472 | Disposition: A | Discharge status: Home / Self Care | Payer: Medicaid Other | Source: Ambulatory Visit | Attending: Neurosurgery | Admitting: Neurosurgery

## 2015-01-08 DIAGNOSIS — D649 Anemia, unspecified: Secondary | ICD-10-CM | POA: Diagnosis not present

## 2015-01-08 DIAGNOSIS — Z79899 Other long term (current) drug therapy: Secondary | ICD-10-CM | POA: Diagnosis not present

## 2015-01-08 DIAGNOSIS — M8580 Other specified disorders of bone density and structure, unspecified site: Secondary | ICD-10-CM | POA: Diagnosis not present

## 2015-01-08 DIAGNOSIS — Z940 Kidney transplant status: Secondary | ICD-10-CM | POA: Diagnosis not present

## 2015-01-08 DIAGNOSIS — M4802 Spinal stenosis, cervical region: Secondary | ICD-10-CM | POA: Diagnosis not present

## 2015-01-08 DIAGNOSIS — M109 Gout, unspecified: Secondary | ICD-10-CM | POA: Diagnosis not present

## 2015-01-08 DIAGNOSIS — Z7952 Long term (current) use of systemic steroids: Secondary | ICD-10-CM | POA: Insufficient documentation

## 2015-01-08 DIAGNOSIS — F329 Major depressive disorder, single episode, unspecified: Secondary | ICD-10-CM | POA: Diagnosis not present

## 2015-01-08 DIAGNOSIS — K219 Gastro-esophageal reflux disease without esophagitis: Secondary | ICD-10-CM | POA: Insufficient documentation

## 2015-01-08 DIAGNOSIS — M5011 Cervical disc disorder with radiculopathy,  high cervical region: Secondary | ICD-10-CM | POA: Insufficient documentation

## 2015-01-08 DIAGNOSIS — Z7982 Long term (current) use of aspirin: Secondary | ICD-10-CM | POA: Diagnosis not present

## 2015-01-08 DIAGNOSIS — F17210 Nicotine dependence, cigarettes, uncomplicated: Secondary | ICD-10-CM | POA: Diagnosis not present

## 2015-01-08 DIAGNOSIS — Z419 Encounter for procedure for purposes other than remedying health state, unspecified: Secondary | ICD-10-CM

## 2015-01-08 DIAGNOSIS — N186 End stage renal disease: Secondary | ICD-10-CM | POA: Diagnosis not present

## 2015-01-08 DIAGNOSIS — Z8673 Personal history of transient ischemic attack (TIA), and cerebral infarction without residual deficits: Secondary | ICD-10-CM | POA: Diagnosis not present

## 2015-01-08 DIAGNOSIS — M5412 Radiculopathy, cervical region: Secondary | ICD-10-CM | POA: Diagnosis not present

## 2015-01-08 DIAGNOSIS — M797 Fibromyalgia: Secondary | ICD-10-CM | POA: Diagnosis not present

## 2015-01-08 DIAGNOSIS — I120 Hypertensive chronic kidney disease with stage 5 chronic kidney disease or end stage renal disease: Secondary | ICD-10-CM | POA: Insufficient documentation

## 2015-01-08 HISTORY — PX: ANTERIOR CERVICAL DECOMP/DISCECTOMY FUSION: SHX1161

## 2015-01-08 HISTORY — DX: Spinal stenosis, cervical region: M48.02

## 2015-01-08 HISTORY — DX: Unspecified atrial fibrillation: I48.91

## 2015-01-08 HISTORY — DX: Unspecified glaucoma: H40.9

## 2015-01-08 HISTORY — DX: Blindness, one eye, unspecified eye: H54.40

## 2015-01-08 HISTORY — DX: Personal history of other diseases of the nervous system and sense organs: Z86.69

## 2015-01-08 HISTORY — DX: Fibromyalgia: M79.7

## 2015-01-08 LAB — COMPREHENSIVE METABOLIC PANEL
ALT: 13 U/L — ABNORMAL LOW (ref 14–54)
AST: 20 U/L (ref 15–41)
Albumin: 4 g/dL (ref 3.5–5.0)
Alkaline Phosphatase: 73 U/L (ref 38–126)
Anion gap: 6 (ref 5–15)
BUN: 30 mg/dL — ABNORMAL HIGH (ref 6–20)
CO2: 25 mmol/L (ref 22–32)
Calcium: 9.7 mg/dL (ref 8.9–10.3)
Chloride: 108 mmol/L (ref 101–111)
Creatinine, Ser: 2.64 mg/dL — ABNORMAL HIGH (ref 0.44–1.00)
GFR calc Af Amer: 24 mL/min — ABNORMAL LOW (ref >60)
GFR calc non Af Amer: 20 mL/min — ABNORMAL LOW (ref >60)
Glucose, Bld: 92 mg/dL (ref 65–99)
Potassium: 4.9 mmol/L (ref 3.5–5.1)
Sodium: 139 mmol/L (ref 135–145)
Total Bilirubin: 0.5 mg/dL (ref 0.3–1.2)
Total Protein: 6.6 g/dL (ref 6.5–8.1)

## 2015-01-08 LAB — CBC
HCT: 26.8 % — ABNORMAL LOW (ref 36.0–46.0)
Hemoglobin: 8.9 g/dL — ABNORMAL LOW (ref 12.0–15.0)
MCH: 31.3 pg (ref 26.0–34.0)
MCHC: 33.2 g/dL (ref 30.0–36.0)
MCV: 94.4 fL (ref 78.0–100.0)
Platelets: 141 10*3/uL — ABNORMAL LOW (ref 150–400)
RBC: 2.84 MIL/uL — ABNORMAL LOW (ref 3.87–5.11)
RDW: 19.8 % — ABNORMAL HIGH (ref 11.5–15.5)
WBC: 3.5 10*3/uL — ABNORMAL LOW (ref 4.0–10.5)

## 2015-01-08 LAB — SURGICAL PCR SCREEN
MRSA, PCR: NEGATIVE
Staphylococcus aureus: NEGATIVE

## 2015-01-08 SURGERY — ANTERIOR CERVICAL DECOMPRESSION/DISCECTOMY FUSION 2 LEVELS
Anesthesia: General | Site: Neck

## 2015-01-08 MED ORDER — SENNOSIDES-DOCUSATE SODIUM 8.6-50 MG PO TABS
1.0000 | ORAL_TABLET | Freq: Every evening | ORAL | Status: DC | PRN
Start: 2015-01-08 — End: 2015-01-10

## 2015-01-08 MED ORDER — HYDROMORPHONE HCL 1 MG/ML IJ SOLN
INTRAMUSCULAR | Status: AC
  Filled 2015-01-08: qty 1

## 2015-01-08 MED ORDER — ZOLPIDEM TARTRATE 5 MG PO TABS
5.0000 mg | ORAL_TABLET | Freq: Every evening | ORAL | Status: DC | PRN
  Administered 2015-01-09: 5 mg via ORAL
  Filled 2015-01-08: qty 1

## 2015-01-08 MED ORDER — SODIUM CHLORIDE 0.9 % IV SOLN: INTRAVENOUS | Status: DC

## 2015-01-08 MED ORDER — THROMBIN 5000 UNITS EX SOLR
OROMUCOSAL | Status: DC | PRN
  Administered 2015-01-08 (×2): 5 mL via TOPICAL

## 2015-01-08 MED ORDER — HEMOSTATIC AGENTS (NO CHARGE) OPTIME
TOPICAL | Status: DC | PRN
  Administered 2015-01-08: 1 via TOPICAL

## 2015-01-08 MED ORDER — PHENOL 1.4 % MT LIQD: 1.0000 | OROMUCOSAL | Status: DC | PRN

## 2015-01-08 MED ORDER — MIDAZOLAM HCL 5 MG/5ML IJ SOLN
INTRAMUSCULAR | Status: DC | PRN
  Administered 2015-01-08: 2 mg via INTRAVENOUS

## 2015-01-08 MED ORDER — MENTHOL 3 MG MT LOZG: 1.0000 | LOZENGE | OROMUCOSAL | Status: DC | PRN

## 2015-01-08 MED ORDER — ACETAMINOPHEN 325 MG PO TABS: 325.0000 mg | ORAL_TABLET | ORAL | Status: DC | PRN

## 2015-01-08 MED ORDER — PROPOFOL 10 MG/ML IV BOLUS
INTRAVENOUS | Status: DC | PRN
  Administered 2015-01-08: 170 mg via INTRAVENOUS

## 2015-01-08 MED ORDER — ACETAMINOPHEN 650 MG RE SUPP: 650.0000 mg | RECTAL | Status: DC | PRN

## 2015-01-08 MED ORDER — 0.9 % SODIUM CHLORIDE (POUR BTL) OPTIME
TOPICAL | Status: DC | PRN
  Administered 2015-01-08: 1000 mL

## 2015-01-08 MED ORDER — HYDROMORPHONE HCL 1 MG/ML IJ SOLN
0.5000 mg | INTRAMUSCULAR | Status: DC | PRN
  Administered 2015-01-08 – 2015-01-10 (×12): 1 mg via INTRAVENOUS
  Filled 2015-01-08 (×12): qty 1

## 2015-01-08 MED ORDER — SODIUM CHLORIDE 0.9 % IJ SOLN: 3.0000 mL | Freq: Two times a day (BID) | INTRAMUSCULAR | Status: DC

## 2015-01-08 MED ORDER — PREDNISONE 5 MG PO TABS
5.0000 mg | ORAL_TABLET | Freq: Every day | ORAL | Status: DC
  Administered 2015-01-09 – 2015-01-10 (×2): 5 mg via ORAL
  Filled 2015-01-08 (×2): qty 1

## 2015-01-08 MED ORDER — FENTANYL CITRATE (PF) 250 MCG/5ML IJ SOLN
INTRAMUSCULAR | Status: AC
  Filled 2015-01-08: qty 5

## 2015-01-08 MED ORDER — THROMBIN 5000 UNITS EX SOLR
CUTANEOUS | Status: DC | PRN
  Administered 2015-01-08 (×2): 5000 [IU] via TOPICAL

## 2015-01-08 MED ORDER — LACTATED RINGERS IV SOLN
INTRAVENOUS | Status: DC | PRN
  Administered 2015-01-08 (×2): via INTRAVENOUS

## 2015-01-08 MED ORDER — ACETAMINOPHEN 325 MG PO TABS: 650.0000 mg | ORAL_TABLET | ORAL | Status: DC | PRN

## 2015-01-08 MED ORDER — LIDOCAINE HCL (CARDIAC) 20 MG/ML IV SOLN
INTRAVENOUS | Status: AC
  Filled 2015-01-08: qty 5

## 2015-01-08 MED ORDER — TACROLIMUS 1 MG PO CAPS
3.0000 mg | ORAL_CAPSULE | Freq: Two times a day (BID) | ORAL | Status: DC
  Administered 2015-01-08 – 2015-01-10 (×4): 3 mg via ORAL
  Filled 2015-01-08 (×5): qty 3

## 2015-01-08 MED ORDER — FUROSEMIDE 40 MG PO TABS
40.0000 mg | ORAL_TABLET | Freq: Every day | ORAL | Status: DC
  Administered 2015-01-10: 40 mg via ORAL
  Filled 2015-01-08: qty 1

## 2015-01-08 MED ORDER — MIDAZOLAM HCL 2 MG/2ML IJ SOLN
INTRAMUSCULAR | Status: AC
  Filled 2015-01-08: qty 2

## 2015-01-08 MED ORDER — NEOSTIGMINE METHYLSULFATE 10 MG/10ML IV SOLN
INTRAVENOUS | Status: DC | PRN
  Administered 2015-01-08: 3 mg via INTRAVENOUS

## 2015-01-08 MED ORDER — ROCURONIUM BROMIDE 50 MG/5ML IV SOLN
INTRAVENOUS | Status: AC
  Filled 2015-01-08: qty 2

## 2015-01-08 MED ORDER — ROCURONIUM BROMIDE 100 MG/10ML IV SOLN
INTRAVENOUS | Status: DC | PRN
  Administered 2015-01-08: 40 mg via INTRAVENOUS

## 2015-01-08 MED ORDER — AZATHIOPRINE 50 MG PO TABS
100.0000 mg | ORAL_TABLET | Freq: Every day | ORAL | Status: DC
  Administered 2015-01-09 – 2015-01-10 (×2): 100 mg via ORAL
  Filled 2015-01-08 (×3): qty 2

## 2015-01-08 MED ORDER — MORPHINE SULFATE (PF) 2 MG/ML IV SOLN
1.0000 mg | INTRAVENOUS | Status: DC | PRN
  Administered 2015-01-08 (×2): 2 mg via INTRAVENOUS
  Administered 2015-01-09: 4 mg via INTRAVENOUS
  Filled 2015-01-08 (×3): qty 1
  Filled 2015-01-08: qty 2

## 2015-01-08 MED ORDER — ONDANSETRON HCL 4 MG/2ML IJ SOLN
4.0000 mg | INTRAMUSCULAR | Status: DC | PRN
Start: 2015-01-08 — End: 2015-01-10
  Administered 2015-01-08 – 2015-01-10 (×6): 4 mg via INTRAVENOUS
  Filled 2015-01-08 (×7): qty 2

## 2015-01-08 MED ORDER — COLCHICINE 0.6 MG PO TABS: 0.6000 mg | ORAL_TABLET | Freq: Every day | ORAL | Status: DC | PRN

## 2015-01-08 MED ORDER — PANTOPRAZOLE SODIUM 40 MG PO TBEC
40.0000 mg | DELAYED_RELEASE_TABLET | Freq: Every day | ORAL | Status: DC
  Administered 2015-01-09 – 2015-01-10 (×2): 40 mg via ORAL
  Filled 2015-01-08 (×2): qty 1

## 2015-01-08 MED ORDER — ONDANSETRON HCL 4 MG/2ML IJ SOLN
INTRAMUSCULAR | Status: DC | PRN
Start: 2015-01-08 — End: 2015-01-08
  Administered 2015-01-08: 4 mg via INTRAVENOUS

## 2015-01-08 MED ORDER — SODIUM CHLORIDE 0.9 % IJ SOLN: 3.0000 mL | INTRAMUSCULAR | Status: DC | PRN

## 2015-01-08 MED ORDER — OXYCODONE HCL 5 MG/5ML PO SOLN: 5.0000 mg | Freq: Once | ORAL | Status: AC | PRN

## 2015-01-08 MED ORDER — FENTANYL CITRATE (PF) 100 MCG/2ML IJ SOLN
INTRAMUSCULAR | Status: DC | PRN
  Administered 2015-01-08 (×3): 100 ug via INTRAVENOUS
  Administered 2015-01-08: 50 ug via INTRAVENOUS
  Administered 2015-01-08: 150 ug via INTRAVENOUS

## 2015-01-08 MED ORDER — ASPIRIN EC 81 MG PO TBEC
81.0000 mg | DELAYED_RELEASE_TABLET | Freq: Every day | ORAL | Status: DC
  Administered 2015-01-09 – 2015-01-10 (×2): 81 mg via ORAL
  Filled 2015-01-08 (×3): qty 1

## 2015-01-08 MED ORDER — ACETAMINOPHEN 160 MG/5ML PO SOLN
325.0000 mg | ORAL | Status: DC | PRN
  Filled 2015-01-08: qty 20.3

## 2015-01-08 MED ORDER — LIDOCAINE HCL (CARDIAC) 20 MG/ML IV SOLN
INTRAVENOUS | Status: DC | PRN
  Administered 2015-01-08: 100 mg via INTRAVENOUS

## 2015-01-08 MED ORDER — MUPIROCIN 2 % EX OINT
1.0000 "application " | TOPICAL_OINTMENT | Freq: Once | CUTANEOUS | Status: AC
  Administered 2015-01-08: 1 via TOPICAL
  Filled 2015-01-08: qty 22

## 2015-01-08 MED ORDER — HYDROMORPHONE HCL 1 MG/ML IJ SOLN
0.2500 mg | INTRAMUSCULAR | Status: DC | PRN
  Administered 2015-01-08 (×4): 0.5 mg via INTRAVENOUS

## 2015-01-08 MED ORDER — OXYCODONE HCL 5 MG PO TABS
ORAL_TABLET | ORAL | Status: AC
  Filled 2015-01-08: qty 1

## 2015-01-08 MED ORDER — OXYCODONE HCL 5 MG PO TABS
5.0000 mg | ORAL_TABLET | Freq: Once | ORAL | Status: AC | PRN
  Administered 2015-01-08: 5 mg via ORAL

## 2015-01-08 MED ORDER — GLYCOPYRROLATE 0.2 MG/ML IJ SOLN
INTRAMUSCULAR | Status: DC | PRN
  Administered 2015-01-08: .4 mg via INTRAVENOUS

## 2015-01-08 MED ORDER — SODIUM CHLORIDE 0.9 % IV SOLN: 250.0000 mL | INTRAVENOUS | Status: DC

## 2015-01-08 MED ORDER — PROPOFOL 10 MG/ML IV BOLUS
INTRAVENOUS | Status: AC
  Filled 2015-01-08: qty 20

## 2015-01-08 MED ORDER — CEFAZOLIN SODIUM 1-5 GM-% IV SOLN
1.0000 g | Freq: Three times a day (TID) | INTRAVENOUS | Status: AC
  Administered 2015-01-08 – 2015-01-09 (×2): 1 g via INTRAVENOUS
  Filled 2015-01-08 (×2): qty 50

## 2015-01-08 MED ORDER — OXYCODONE-ACETAMINOPHEN 5-325 MG PO TABS
1.0000 | ORAL_TABLET | ORAL | Status: DC | PRN
  Administered 2015-01-08: 2 via ORAL
  Filled 2015-01-08: qty 2

## 2015-01-08 SURGICAL SUPPLY — 58 items
BENZOIN TINCTURE PRP APPL 2/3 (GAUZE/BANDAGES/DRESSINGS) ×2 IMPLANT
BIT DRILL SM SPINE QC 12 (BIT) ×2 IMPLANT
BLADE ULTRA TIP 2M (BLADE) ×2 IMPLANT
BNDG GAUZE ELAST 4 BULKY (GAUZE/BANDAGES/DRESSINGS) IMPLANT
BUR BARREL STRAIGHT FLUTE 4.0 (BURR) ×2 IMPLANT
BUR MATCHSTICK NEURO 3.0 LAGG (BURR) ×2 IMPLANT
CANISTER SUCT 3000ML PPV (MISCELLANEOUS) ×2 IMPLANT
COVER MAYO STAND STRL (DRAPES) ×2 IMPLANT
DERMABOND ADVANCED (GAUZE/BANDAGES/DRESSINGS) ×1
DERMABOND ADVANCED .7 DNX12 (GAUZE/BANDAGES/DRESSINGS) ×1 IMPLANT
DRAIN JACKSON PRATT 10MM FLAT (MISCELLANEOUS) ×2 IMPLANT
DRAPE C-ARM 42X72 X-RAY (DRAPES) IMPLANT
DRAPE LAPAROTOMY 100X72 PEDS (DRAPES) ×2 IMPLANT
DRAPE MICROSCOPE LEICA (MISCELLANEOUS) IMPLANT
DRAPE POUCH INSTRU U-SHP 10X18 (DRAPES) ×2 IMPLANT
DRAPE PROXIMA HALF (DRAPES) ×4 IMPLANT
DRSG OPSITE POSTOP 3X4 (GAUZE/BANDAGES/DRESSINGS) ×2 IMPLANT
DRSG OPSITE POSTOP 4X6 (GAUZE/BANDAGES/DRESSINGS) ×2 IMPLANT
DURAPREP 6ML APPLICATOR 50/CS (WOUND CARE) ×2 IMPLANT
ELECT REM PT RETURN 9FT ADLT (ELECTROSURGICAL) ×2
ELECTRODE REM PT RTRN 9FT ADLT (ELECTROSURGICAL) ×1 IMPLANT
EVACUATOR SILICONE 100CC (DRAIN) ×2 IMPLANT
GAUZE SPONGE 4X4 12PLY STRL (GAUZE/BANDAGES/DRESSINGS) IMPLANT
GAUZE SPONGE 4X4 16PLY XRAY LF (GAUZE/BANDAGES/DRESSINGS) ×2 IMPLANT
GLOVE BIOGEL M 8.0 STRL (GLOVE) ×2 IMPLANT
GLOVE BIOGEL PI IND STRL 6.5 (GLOVE) ×2 IMPLANT
GLOVE BIOGEL PI INDICATOR 6.5 (GLOVE) ×2
GLOVE EXAM NITRILE LRG STRL (GLOVE) IMPLANT
GLOVE EXAM NITRILE MD LF STRL (GLOVE) IMPLANT
GLOVE EXAM NITRILE XL STR (GLOVE) IMPLANT
GLOVE EXAM NITRILE XS STR PU (GLOVE) IMPLANT
GLOVE SURG SS PI 6.5 STRL IVOR (GLOVE) ×8 IMPLANT
GOWN STRL REUS W/ TWL LRG LVL3 (GOWN DISPOSABLE) ×3 IMPLANT
GOWN STRL REUS W/ TWL XL LVL3 (GOWN DISPOSABLE) ×1 IMPLANT
GOWN STRL REUS W/TWL 2XL LVL3 (GOWN DISPOSABLE) IMPLANT
GOWN STRL REUS W/TWL LRG LVL3 (GOWN DISPOSABLE) ×3
GOWN STRL REUS W/TWL XL LVL3 (GOWN DISPOSABLE) ×1
HALTER HD/CHIN CERV TRACTION D (MISCELLANEOUS) IMPLANT
HEMOSTAT POWDER KIT SURGIFOAM (HEMOSTASIS) ×2 IMPLANT
KIT BASIN OR (CUSTOM PROCEDURE TRAY) ×2 IMPLANT
KIT ROOM TURNOVER OR (KITS) ×2 IMPLANT
NEEDLE SPNL 22GX3.5 QUINCKE BK (NEEDLE) ×2 IMPLANT
NS IRRIG 1000ML POUR BTL (IV SOLUTION) ×2 IMPLANT
PACK LAMINECTOMY NEURO (CUSTOM PROCEDURE TRAY) ×2 IMPLANT
PATTIES SURGICAL .5 X1 (DISPOSABLE) ×2 IMPLANT
PLATE 30MM PROVIDENCE (Plate) ×2 IMPLANT
RUBBERBAND STERILE (MISCELLANEOUS) ×4 IMPLANT
SCREW SELF TAP VAR 4.2X12PRO (Screw) ×12 IMPLANT
SPACER CERVICAL FRGE 12X14X6-7 (Spacer) ×4 IMPLANT
SPONGE INTESTINAL PEANUT (DISPOSABLE) ×2 IMPLANT
SPONGE SURGIFOAM ABS GEL SZ50 (HEMOSTASIS) ×2 IMPLANT
STRIP CLOSURE SKIN 1/2X4 (GAUZE/BANDAGES/DRESSINGS) ×2 IMPLANT
SUT VIC AB 0 CT1 27 (SUTURE)
SUT VIC AB 0 CT1 27XBRD ANBCTR (SUTURE) IMPLANT
SUT VIC AB 3-0 SH 8-18 (SUTURE) ×2 IMPLANT
TOWEL OR 17X24 6PK STRL BLUE (TOWEL DISPOSABLE) ×2 IMPLANT
TOWEL OR 17X26 10 PK STRL BLUE (TOWEL DISPOSABLE) ×2 IMPLANT
WATER STERILE IRR 1000ML POUR (IV SOLUTION) ×2 IMPLANT

## 2015-01-08 NOTE — Progress Notes (Signed)
Orthopedic Tech Progress Note Patient Details:  Tina Watkins 1965-07-25 [id number]  Ortho Devices Type of Ortho Device: Soft collar Ortho Device/Splint Location: neck Ortho Device/Splint Interventions: Application   [name] 01/08/2015, 1:36 PM

## 2015-01-08 NOTE — Progress Notes (Signed)
Patient arrived to 5M12 from PACU at 1630. Patient alert and oriented X4 with no c/o pain. Vital signs taken and charted. Oriented to room with bed alarm on.

## 2015-01-08 NOTE — Transfer of Care (Signed)
Immediate Anesthesia Transfer of Care Note  Patient: Tina Watkins  Procedure(s) Performed: Procedure(s) with comments: Anterior Cervical Three-Four/Four-Five Decompression/Diskectomy/Fusion (N/A) - C3-4 C4-5 Anterior cervical decompression/diskectomy/fusion  Patient Location: PACU  Anesthesia Type:General  Level of Consciousness: awake, alert  and oriented  Airway & Oxygen Therapy: Patient Spontanous Breathing and Patient connected to nasal cannula oxygen  Post-op Assessment: Report given to RN, Post -op Vital signs reviewed and stable and Patient moving all extremities X 4  Post vital signs: Reviewed and stable  Last Vitals:  Filed Vitals:   01/08/15 0719 01/08/15 1237  BP: 92/63 113/86  Pulse: 74 75  Temp: 36.6 C 36.5 C  Resp: 20     Complications: No apparent anesthesia complications

## 2015-01-08 NOTE — Anesthesia Preprocedure Evaluation (Addendum)
Anesthesia Evaluation  Patient identified by MRN, date of birth, ID band Patient awake    Reviewed: Allergy & Precautions, H&P , NPO status , Patient's Chart, lab work & pertinent test results  History of Anesthesia Complications Negative for: history of anesthetic complications  Airway Mallampati: II  TM Distance: >3 FB Neck ROM: Full    Dental  (+) Dental Advisory Given, Chipped, Edentulous Upper Upper teeth pulled last week- still some sutures in - upper front:   Pulmonary neg shortness of breath, neg sleep apnea, neg COPD, neg recent URI, Current Smoker, former smoker,    breath sounds clear to auscultation       Cardiovascular + dysrhythmias Supra Ventricular Tachycardia  Rhythm:Regular Rate:Normal     Neuro/Psych  Headaches, Seizures -, Well Controlled,  PSYCHIATRIC DISORDERS Anxiety Depression  Neuromuscular disease CVA, No Residual Symptoms    GI/Hepatic Neg liver ROS, GERD  Medicated and Controlled,  Endo/Other  negative endocrine ROS  Renal/GU CRFRenal diseaseS/P transplant     Musculoskeletal  (+) Arthritis , Fibromyalgia -  Abdominal   Peds  Hematology negative hematology ROS (+) Blood dyscrasia, anemia ,   Anesthesia Other Findings   Reproductive/Obstetrics negative OB ROS                            Anesthesia Physical Anesthesia Plan  ASA: III  Anesthesia Plan: General   Post-op Pain Management:    Induction: Intravenous  Airway Management Planned: Oral ETT  Additional Equipment: None  Intra-op Plan:   Post-operative Plan: Extubation in OR  Informed Consent: I have reviewed the patients History and Physical, chart, labs and discussed the procedure including the risks, benefits and alternatives for the proposed anesthesia with the patient or authorized representative who has indicated his/her understanding and acceptance.   Dental advisory given  Plan Discussed  with: CRNA and Surgeon  Anesthesia Plan Comments:         Anesthesia Quick Evaluation

## 2015-01-08 NOTE — Anesthesia Postprocedure Evaluation (Signed)
Anesthesia Post Note  Patient: Tina Watkins  Procedure(s) Performed: Procedure(s) (LRB): Anterior Cervical Three-Four/Four-Five Decompression/Diskectomy/Fusion (N/A)  Patient location during evaluation: PACU Anesthesia Type: General Level of consciousness: awake Pain management: pain level controlled Vital Signs Assessment: post-procedure vital signs reviewed and stable Respiratory status: spontaneous breathing Cardiovascular status: stable Postop Assessment: no signs of nausea or vomiting Anesthetic complications: no    Last Vitals:  Filed Vitals:   01/08/15 1430 01/08/15 1438  BP:    Pulse: 61 68  Temp:    Resp: 13 21    Last Pain:  Filed Vitals:   01/08/15 1438  PainSc: 10-Worst pain ever                 Fabricio Endsley

## 2015-01-08 NOTE — Anesthesia Procedure Notes (Signed)
Procedure Name: Intubation Date/Time: 01/08/2015 9:57 AM Performed by: Mariea Clonts Pre-anesthesia Checklist: Patient identified, Timeout performed, Emergency Drugs available, Suction available and Patient being monitored Patient Re-evaluated:Patient Re-evaluated prior to inductionOxygen Delivery Method: Circle system utilized Preoxygenation: Pre-oxygenation with 100% oxygen Intubation Type: IV induction Ventilation: Mask ventilation without difficulty Laryngoscope Size: Miller and 2 Grade View: Grade II Tube type: Oral Tube size: 7.0 mm Number of attempts: 1 Placement Confirmation: ETT inserted through vocal cords under direct vision,  breath sounds checked- equal and bilateral and positive ETCO2 Tube secured with: Tape Dental Injury: Teeth and Oropharynx as per pre-operative assessment

## 2015-01-09 ENCOUNTER — Encounter (HOSPITAL_COMMUNITY): Payer: Self-pay | Admitting: Neurosurgery

## 2015-01-09 DIAGNOSIS — M4802 Spinal stenosis, cervical region: Secondary | ICD-10-CM | POA: Diagnosis not present

## 2015-01-09 MED ORDER — DIPHENHYDRAMINE HCL 50 MG/ML IJ SOLN
25.0000 mg | Freq: Four times a day (QID) | INTRAMUSCULAR | Status: DC | PRN
  Administered 2015-01-09: 50 mg via INTRAVENOUS
  Administered 2015-01-09: 25 mg via INTRAVENOUS
  Administered 2015-01-10 (×2): 50 mg via INTRAVENOUS
  Filled 2015-01-09 (×4): qty 1

## 2015-01-09 NOTE — Progress Notes (Signed)
Occupational Therapy Evaluation Patient Details Name: Tina Watkins MRN: [id number] DOB: 1965-11-16 Today's Date: 01/09/2015    History of Present Illness 49 y.o. female s/p  anterior  C3-4, C4-5 diskectomy, decompression of the spinal cord, foraminotomy, interbody fusion. 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    Clinical Impression   PTA, pt was independent with all ADL and mobility. Pt currently presents with decreased independence with self-care tasks and impaired balance. Pt completed grooming tasks and functional transfers at supervision level due to impaired balance and decreased understanding of cervical precautions. Pt's mother will be available until Saturday (12/0/16) to assist, but pt has no other assistance available after this date. Recommend HHOT at this time, but if pt's pain becomes managed and pt progresses well then d/c recommendations may change.    Follow Up Recommendations  Home health OT    Equipment Recommendations  3 in 1 bedside comode    Recommendations for Other Services       Precautions / Restrictions Precautions Precautions: Cervical;Fall Precaution Comments: Reviewed cervical precautions, pt able to recall 3/3 cervical precautions at end of session. Required Braces or Orthoses: Cervical Brace Cervical Brace: Soft collar;At all times Restrictions Weight Bearing Restrictions: No      Mobility Bed Mobility Overal bed mobility: Needs Assistance Bed Mobility: Supine to Sit;Sit to Supine     Supine to sit: Supervision;HOB elevated Sit to supine: Supervision;HOB elevated   General bed mobility comments: HOB elevated 60 degrees, use of bedrails. Pt unwilling to attempt log roll techniques due to pain. Informed pt that she will need to practice this technique prior to d/c.   Transfers Overall transfer level: Needs  assistance Equipment used: None Transfers: Sit to/from Stand Sit to Stand: Supervision         General transfer comment: Supervision for safety. Safe placement of hands. No LOB.    Balance Overall balance assessment: Needs assistance Sitting-balance support: No upper extremity supported;Feet supported Sitting balance-Leahy Scale: Fair     Standing balance support: No upper extremity supported;During functional activity Standing balance-Leahy Scale: Fair                              ADL Overall ADL's : Needs assistance/impaired     Grooming: Oral care;Wash/dry hands;Supervision/safety;Set up;Cueing for compensatory techniques;Standing Grooming Details (indicate cue type and reason): Verbal cues for cup method for oral care             Lower Body Dressing: Supervision/safety;Cueing for compensatory techniques;Sit to/from stand Lower Body Dressing Details (indicate cue type and reason): Don socks only - able to cross ankle over knee Toilet Transfer: Supervision/safety;Ambulation;BSC   Toileting- Water quality scientist and Hygiene: Supervision/safety;Sit to/from stand       Functional mobility during ADLs: Supervision/safety General ADL Comments: Pt with increased pain and fatigue that limited progress during session.      Vision Vision Assessment?: Vision impaired- to be further tested in functional context Additional Comments: loss of R eye   Perception     Praxis      Pertinent Vitals/Pain Pain Assessment: 0-10 Pain Score: 10-Worst pain ever Pain Location: Neck Pain Descriptors / Indicators: Aching;Sore Pain Intervention(s): Limited activity within patient's tolerance;Monitored during session;Premedicated before session;Repositioned     Hand Dominance Right   Extremity/Trunk Assessment Upper Extremity Assessment Upper Extremity Assessment: Overall WFL for tasks assessed  Lower Extremity Assessment Lower Extremity Assessment: Overall WFL  for tasks assessed   Cervical / Trunk Assessment Cervical / Trunk Assessment: Normal   Communication Communication Communication: No difficulties   Cognition Arousal/Alertness: Awake/alert Behavior During Therapy: Flat affect Overall Cognitive Status: Within Functional Limits for tasks assessed       Memory: Decreased recall of precautions             General Comments       Exercises       Shoulder Instructions      Home Living Family/patient expects to be discharged to:: Private residence Living Arrangements: Alone Available Help at Discharge: Family;Available 24 hours/day (Until Saturday) Type of Home: Apartment Home Access: Stairs to enter Entrance Stairs-Number of Steps: 5 Entrance Stairs-Rails: Right;Left Home Layout: One level     Bathroom Shower/Tub: Teacher, early years/pre: Standard     Home Equipment: Environmental consultant - 2 wheels;Kasandra Knudsen - single point   Additional Comments: Pt's mom will be in town until Saturday to assist pt. Pt has no friends/family in the area to assist after Saturday.      Prior Functioning/Environment Level of Independence: Independent             OT Diagnosis: Generalized weakness;Disturbance of vision;Acute pain   OT Problem List: Decreased strength;Decreased range of motion;Decreased activity tolerance;Impaired balance (sitting and/or standing);Decreased coordination;Decreased safety awareness;Decreased knowledge of use of DME or AE;Decreased knowledge of precautions;Pain   OT Treatment/Interventions: Self-care/ADL training;Therapeutic exercise;DME and/or AE instruction;Therapeutic activities;Visual/perceptual remediation/compensation;Patient/family education;Balance training    OT Goals(Current goals can be found in the care plan section) Acute Rehab OT Goals Patient Stated Goal: to go home OT Goal Formulation: With patient Time For Goal Achievement: 01/23/15 Potential to Achieve Goals: Good ADL Goals Pt Will  Perform Upper Body Bathing: with modified independence;with adaptive equipment;standing Pt Will Perform Lower Body Bathing: with modified independence;with adaptive equipment;sit to/from stand Pt Will Perform Upper Body Dressing: with modified independence;with adaptive equipment;sitting Pt Will Perform Lower Body Dressing: with modified independence;with adaptive equipment;sit to/from stand Pt Will Transfer to Toilet: with modified independence;ambulating;bedside commode Pt Will Perform Toileting - Clothing Manipulation and hygiene: with modified independence;sit to/from stand;sitting/lateral leans Pt Will Perform Tub/Shower Transfer: Tub transfer;with modified independence;ambulating Additional ADL Goal #1: Pt will demonstrate understanding of 3/3 cervical precautions.  OT Frequency: Min 2X/week   Barriers to D/C: Decreased caregiver support  Pt has no assistance after Saturday (01/12/15)       Co-evaluation              End of Session Equipment Utilized During Treatment: Gait belt Nurse Communication: Mobility status;Precautions  Activity Tolerance: Patient limited by pain;Patient limited by fatigue Patient left: in bed;with call bell/phone within reach;with bed alarm set   Time: [id number] OT Time Calculation (min): 30 min Charges:  OT General Charges $OT Visit: 1 Procedure OT Evaluation $Initial OT Evaluation Tier I: 1 Procedure OT Treatments $Self Care/Home Management : 8-22 mins G-Codes:    Redmond Baseman, OTR/L 01/29/2015, 11:23 AM

## 2015-01-09 NOTE — Evaluation (Signed)
Physical Therapy Evaluation Patient Details Name: Tina Watkins MRN: [id number] DOB: February 26, 1965 Today's Date: 01/09/2015   History of Present Illness  49 y.o. female s/p  anterior  C3-4, C4-5 diskectomy, decompression of the spinal cord, foraminotomy, interbody fusion. 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   Clinical Impression  Patient presents with decreased independence with mobility due to deficits listed below in PT problem list.  She will benefit from skilled PT in the acute setting to allow return home with initial assist from mother and HHPT.    Follow Up Recommendations Home health PT    Equipment Recommendations  None recommended by PT    Recommendations for Other Services       Precautions / Restrictions Precautions Precautions: Fall;Cervical Required Braces or Orthoses: Cervical Brace Cervical Brace: Soft collar;At all times      Mobility  Bed Mobility Overal bed mobility: Needs Assistance Bed Mobility: Rolling;Sidelying to Sit;Sit to Sidelying Rolling: Supervision;Min guard Sidelying to sit: Min assist;Supervision     Sit to sidelying: Supervision General bed mobility comments: cues for technique, initially wanting to keep HOB elevated due to pain, but once performed once she was eager to practice and learn appropriate technqiue; even simulated with HOB elevated due to pt sleeps with large pillow (husband) and two other pillows  Transfers Overall transfer level: Needs assistance Equipment used: None Transfers: Sit to/from Stand Sit to Stand: Supervision         General transfer comment: no physical assist needed, assist for safety and educated in technique with handout  Ambulation/Gait Ambulation/Gait assistance: Min guard;Supervision Ambulation Distance (Feet): 220 Feet Assistive device: None Gait Pattern/deviations:  WFL(Within Functional Limits);Step-through pattern;Decreased stride length     General Gait Details: demonstrates good balance, but gave support at times for safety due to eyes closing with reports of pain   Stairs Stairs: Yes Stairs assistance: Supervision Stair Management: One rail Right;Step to pattern;Forwards Number of Stairs: 4 General stair comments: assist for safety, pt feeling edge of step prior to sedcent with her toe/foot due to poor depth perception with visual issues, as well as unable to visualize step due to cervical collar.  Wheelchair Mobility    Modified Rankin (Stroke Patients Only)       Balance     Sitting balance-Leahy Scale: Good     Standing balance support: No upper extremity supported Standing balance-Leahy Scale: Good                               Pertinent Vitals/Pain Pain Assessment: Faces Faces Pain Scale: Hurts even more Pain Location: back of neck Pain Descriptors / Indicators: Sharp Pain Intervention(s): Monitored during session;Limited activity within patient's tolerance    Home Living Family/patient expects to be discharged to:: Private residence Living Arrangements: Alone Available Help at Discharge: Family;Available 24 hours/day Type of Home: Apartment Home Access: Stairs to enter Entrance Stairs-Rails: Right;Left Entrance Stairs-Number of Steps: 7 Home Layout: One level Home Equipment: Walker - 2 wheels;Cane - single point Additional Comments: mother to assist for 5 days    Prior Function Level of Independence: Independent               Hand Dominance   Dominant Hand: Right    Extremity/Trunk Assessment   Upper Extremity Assessment: Defer to OT evaluation  Lower Extremity Assessment: Overall WFL for tasks assessed         Communication   Communication: No difficulties  Cognition Arousal/Alertness: Awake/alert Behavior During Therapy: WFL for tasks assessed/performed Overall  Cognitive Status: Within Functional Limits for tasks assessed                      General Comments General comments (skin integrity, edema, etc.): scratching legs and under cervical collar reports medication makes her itch, applied cream to legs and encouraged to avoid scratching due to infection risk    Exercises        Assessment/Plan    PT Assessment Patient needs continued PT services  PT Diagnosis Difficulty walking;Acute pain   PT Problem List Decreased strength;Decreased activity tolerance;Decreased mobility;Decreased balance;Decreased knowledge of precautions;Pain  PT Treatment Interventions Gait training;Balance training;Stair training;Functional mobility training;Patient/family education;Therapeutic activities;Therapeutic exercise   PT Goals (Current goals can be found in the Care Plan section) Acute Rehab PT Goals Patient Stated Goal: to go home PT Goal Formulation: With patient Time For Goal Achievement: 01/12/15 Potential to Achieve Goals: Good    Frequency Min 5X/week   Barriers to discharge        Co-evaluation               End of Session Equipment Utilized During Treatment: Gait belt Activity Tolerance: Patient tolerated treatment well Patient left: in bed;with call bell/phone within reach;with family/visitor present (sitting edge of bed)           Time: 1330-1401 PT Time Calculation (min) (ACUTE ONLY): 31 min   Charges:   PT Evaluation $Initial PT Evaluation Tier I: 1 Procedure PT Treatments $Gait Training: 8-22 mins   PT G Codes:        Tina Watkins,Tina Watkins 2015-01-23, 3:16 PM  Tina Watkins, Tina Watkins 01-23-15

## 2015-01-09 NOTE — Progress Notes (Signed)
Patient ID: Tina Watkins, female   DOB: 1966/01/25, 49 y.o.   MRN: [id number] No weakness,voice nl. Pain between shoulders. Still some drainage

## 2015-01-09 NOTE — Op Note (Signed)
Tina Watkins, Tina Watkins NO.:  [id number]  MEDICAL RECORD NO.:  [id number]  LOCATION:  5M12C                        FACILITY:  [facility]  PHYSICIAN:  Leeroy Cha, M.D.   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  PROCEDURE:  Anterior cervical 3-4, 4-5 diskectomy, decompression of the spinal cord, foraminotomy, interbody fusion with allograft plate, microscope.  SURGEON:  Leeroy Cha, M.D.  ASSISTANT:  Dr. Kathyrn Sheriff.  CLINICAL HISTORY:  [name] is a lady, who is visiting my office complaining of neck pain, radiation to both upper extremity associated with weakness and pain with any type of movement.  She is getting worse. MRI showed worsening of degenerative disk disease and stenosis at the cervical 3-4, 4-5.  Surgery was advised and she knew the risk and benefit.  DESCRIPTION OF PROCEDURE:  The patient was taken to the OR, and after intubation, the right side of the neck was prepped with ChloraPrep. Drapes were applied.  The patient had cervical shortening and we had to use a bump in between the shoulders.  Incision was made in the right side with skin, subcutaneous tissue, platysma, straight down to cervical spine.  First x-ray showed that we were right at the level of 4-5. Retraction was made.  We entered the disk space, which was calcified. We went all the way to posterior ligament.  Using the 1 and 2 mm Kerrison punch, we opened the posterior ligament, decompressed the spinal cord as well as the C5 nerve root.  The same procedure with the worse finding was done at the level of C3-4 with decompression of the cord and the C4 nerve root.  Then 2 allograft of 7 mm height, lordotic were  inserted with a plate with 6 screws.  Lateral cervical spine showed good position of the plate and the graft.  From then on, although we accomplished good hemostasis because of her history, we left a drain. The wound was closed with Vicryl and Steri-Strips.          ______________________________ Leeroy Cha, M.D.     EB/MEDQ  D:  01/08/2015  T:  01/09/2015  Job:  [id number]

## 2015-01-10 DIAGNOSIS — M4802 Spinal stenosis, cervical region: Secondary | ICD-10-CM | POA: Diagnosis not present

## 2015-01-10 NOTE — Progress Notes (Signed)
0500 hrs, Pt was using restroom and pulled JP drain from Sx site. No drainage at that time from site, Drain had only collected about 50ml over past 24 hrs, will monitor

## 2015-01-10 NOTE — Discharge Summary (Signed)
Physician Discharge Summary  Patient ID: Tina Watkins MRN: [id number] DOB/AGE: [birth year] 47 y.o.  Admit date: 01/08/2015 Discharge date: 01/10/2015  Admission Diagnoses:cervical stenosis  Discharge Diagnoses:  Active Problems:   Cervical stenosis of spinal canal   Discharged Condition: no weakness  Hospital Course: surgery  Consults  none  Significant Diagnostic Studies: mri  Treatments: c3 to c5 fusion  Discharge Exam: Blood pressure 94/53, pulse 62, temperature 98.6 F (37 C), temperature source Oral, resp. rate 18, height 4\' 11"  (1.499 m), weight 50.803 kg (112 lb), SpO2 100 %. No weakness. Drain out  eating  Disposition: home with mother     Medication List    ASK your doctor about these medications        ARANESP IJ  Inject as directed every 30 (thirty) days.     aspirin EC 81 MG tablet  Take 81 mg by mouth daily.     azaTHIOprine 50 MG tablet  Commonly known as:  IMURAN  Take 100 mg by mouth daily.     colchicine 0.6 MG tablet  Take 0.6 mg by mouth daily as needed (For gout.).     furosemide 40 MG tablet  Commonly known as:  LASIX  Take 40 mg by mouth daily.     metoCLOPramide 5 MG tablet  Commonly known as:  REGLAN  TAKE 1 TABLET (5 MG TOTAL) BY MOUTH AT BEDTIME AND MAY REPEAT DOSE ONE TIME IF NEEDED.     multivitamin tablet  Take 1 tablet by mouth daily.     omeprazole 40 MG capsule  Commonly known as:  PRILOSEC  Take 40 mg by mouth 2 (two) times daily.     Oxycodone HCl 10 MG Tabs  Take 10 mg by mouth every 8 (eight) hours as needed (pain).     oxyCODONE-acetaminophen 10-325 MG tablet  Commonly known as:  PERCOCET  Take 1 tablet by mouth every 6 (six) hours as needed for pain.     predniSONE 5 MG tablet  Commonly known as:  DELTASONE  Take 5 mg by mouth daily.     PRENATAL VITAMIN PO  Take by mouth.     tacrolimus 1 MG capsule  Commonly known as:  PROGRAF  Take 3 mg by mouth 2 (two) times daily.     triamcinolone cream  0.1 %  Commonly known as:  KENALOG  Apply 1 application topically as needed.     zolpidem 10 MG tablet  Commonly known as:  AMBIEN  Take 10 mg by mouth at bedtime as needed for sleep.         Signed: Floyce Stakes 01/10/2015, 9:09 AM

## 2015-01-10 NOTE — Progress Notes (Signed)
PT Cancellation Note  Patient Details Name: Tina Watkins MRN: [id number] DOB: 1965/08/31   Cancelled Treatment:    Reason Eval/Treat Not Completed:  Patient denies need for further PT. States the PT yesterday covered everything and she feels prepared to d/c home with mother's assist. Noted HHPT is not an option per Medicaid. Does not appear this will be an issue.   [name] 01/10/2015, 12:25 PM  Pager [phone]

## 2015-01-10 NOTE — Care Management Note (Signed)
Case Management Note  Patient Details  Name: Tina Watkins MRN: [id number] Date of Birth: 10-14-1965  Subjective/Objective:                    Action/Plan: Patient being discharged home today self care and her mother to assist. Patient unable to receive home health with her Medicaid with her current diagnosis. No further needs per CM.   Expected Discharge Date:                  Expected Discharge Plan:  [facility]  In-House Referral:     Discharge planning Services     Post Acute Care Choice:    Choice offered to:     DME Arranged:    DME Agency:     HH Arranged:    [place] Agency:     Status of Service:  Completed, signed off  Medicare Important Message Given:    Date Medicare IM Given:    Medicare IM give by:    Date Additional Medicare IM Given:    Additional Medicare Important Message give by:     If discussed at [place] of Stay Meetings, dates discussed:    Additional Comments:  Pollie Friar, RN 01/10/2015, 10:02 AM

## 2015-01-10 NOTE — Progress Notes (Signed)
Occupational Therapy Treatment Patient Details Name: Tina Watkins MRN: [id number] DOB: 1965-10-19 Today's Date: 01/10/2015    History of present illness 49 y.o. female s/p  anterior  C3-4, C4-5 diskectomy, decompression of the spinal cord, foraminotomy, interbody fusion. 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    OT comments  Pt progressing very well although still experiencing significant pain. Pt completed all self-care tasks and functional mobility with supervision-mod independence. Pt has no further acute OT needs. All education completed and pt has no further questions. OT signing off.   Follow Up Recommendations  No OT follow up;Supervision - Intermittent    Equipment Recommendations  3 in 1 bedside comode    Recommendations for Other Services      Precautions / Restrictions Precautions Precautions: Fall;Cervical Precaution Comments: Pt able to recall and demonstrate understanding of all cervical precautions Required Braces or Orthoses: Cervical Brace Cervical Brace: Soft collar Restrictions Weight Bearing Restrictions: No       Mobility Bed Mobility Overal bed mobility: Needs Assistance Bed Mobility: Rolling;Sidelying to Sit;Sit to Sidelying Rolling: Modified independent (Device/Increase time) Sidelying to sit: Modified independent (Device/Increase time)     Sit to sidelying: Modified independent (Device/Increase time) General bed mobility comments: HOB flat, no use of bedrails to simulate home environment. Verbal cues for log roll technique.   Transfers Overall transfer level: Needs assistance Equipment used: None Transfers: Sit to/from Stand Sit to Stand: Modified independent (Device/Increase time)              Balance Overall balance assessment: Needs assistance Sitting-balance support: No upper extremity supported;Feet  supported Sitting balance-Leahy Scale: Good     Standing balance support: No upper extremity supported;During functional activity Standing balance-Leahy Scale: Good                     ADL Overall ADL's : Needs assistance/impaired                 Upper Body Dressing : Modified independent;Sitting   Lower Body Dressing: Modified independent;Sit to/from stand   Toilet Transfer: Modified Independent;Ambulation;Regular Toilet   Toileting- Clothing Manipulation and Hygiene: Modified independent;Sit to/from stand   Tub/ Shower Transfer: Tub transfer;Supervision/safety;Ambulation   Functional mobility during ADLs: Modified independent General ADL Comments: Completed self-care tasks, transfers, and ambulated in hallway with supervision-mod I. Educated and practiced compensatory strategies for oral care, pericare, and LB ADLs. Reviewed fall prevention strategies.      Vision                     Perception     Praxis      Cognition   Behavior During Therapy: WFL for tasks assessed/performed Overall Cognitive Status: Within Functional Limits for tasks assessed                       Extremity/Trunk Assessment               Exercises     Shoulder Instructions       General Comments      Pertinent Vitals/ Pain       Pain Assessment: 0-10 Pain Score: 8  Pain Location: neck Pain Descriptors / Indicators: Sharp;Sore;Operative site guarding Pain Intervention(s): Limited activity within patient's tolerance;Premedicated before session;Monitored during session;Repositioned  Home Living  Prior Functioning/Environment              Frequency       Progress Toward Goals  OT Goals(current goals can now be found in the care plan section)  Progress towards OT goals: Goals met/education completed, patient discharged from OT  Acute Rehab OT Goals Patient Stated Goal: to go  home OT Goal Formulation: With patient Time For Goal Achievement: 01/23/15 Potential to Achieve Goals: Good ADL Goals Pt Will Perform Upper Body Bathing: with modified independence;with adaptive equipment;standing Pt Will Perform Lower Body Bathing: with modified independence;with adaptive equipment;sit to/from stand Pt Will Perform Upper Body Dressing: with modified independence;with adaptive equipment;sitting Pt Will Perform Lower Body Dressing: with modified independence;with adaptive equipment;sit to/from stand Pt Will Transfer to Toilet: with modified independence;ambulating;bedside commode Pt Will Perform Toileting - Clothing Manipulation and hygiene: with modified independence;sit to/from stand;sitting/lateral leans Pt Will Perform Tub/Shower Transfer: Tub transfer;with modified independence;ambulating Additional ADL Goal #1: Pt will demonstrate understanding of 3/3 cervical precautions.  Plan All goals met and education completed, patient discharged from OT services    Co-evaluation                 End of Session Equipment Utilized During Treatment: Gait belt   Activity Tolerance Patient tolerated treatment well   Patient Left in bed;with bed alarm set;with call bell/phone within reach   Nurse Communication Mobility status;Precautions        Time: 1126-1150 OT Time Calculation (min): 24 min  Charges: OT General Charges $OT Visit: 1 Procedure OT Treatments $Self Care/Home Management : 23-37 mins  Redmond Baseman, OTR/L 01/10/2015, 12:06 PM

## 2015-01-10 NOTE — Progress Notes (Signed)
Pt for discharge home today. Discharge orders received. IV and telemetry dcd with dressing clean dry and intact to neck.  Discharge instructions and prescriptions given with verbalized understanding. Staff brought patient to lobby via wheelchair at 1630. Transported to home by family member.

## 2015-01-16 NOTE — Progress Notes (Signed)
PT G-Code Note   01/16/15 0800  PT G-Codes **NOT FOR INPATIENT CLASS**  Functional Assessment Tool Used Clinical Observation  Functional Limitation Mobility: Walking and moving around  Mobility: Walking and Moving Around Current Status [phone]) CI  Mobility: Walking and Moving Around Goal Status [phone]) CI  [place], [place] [phone] 01/16/2015

## 2015-02-08 ENCOUNTER — Encounter (HOSPITAL_COMMUNITY): Payer: Medicaid Other

## 2015-02-11 ENCOUNTER — Encounter (HOSPITAL_COMMUNITY)
Admission: RE | Admit: 2015-02-11 | Discharge: 2015-02-11 | Disposition: A | Discharge status: Home / Self Care | Payer: Medicaid Other | Source: Ambulatory Visit | Attending: Nephrology | Admitting: Nephrology

## 2015-02-11 DIAGNOSIS — Z940 Kidney transplant status: Secondary | ICD-10-CM | POA: Diagnosis not present

## 2015-02-11 DIAGNOSIS — N185 Chronic kidney disease, stage 5: Secondary | ICD-10-CM | POA: Insufficient documentation

## 2015-02-11 DIAGNOSIS — D631 Anemia in chronic kidney disease: Secondary | ICD-10-CM | POA: Insufficient documentation

## 2015-02-11 DIAGNOSIS — D638 Anemia in other chronic diseases classified elsewhere: Secondary | ICD-10-CM | POA: Diagnosis present

## 2015-02-11 LAB — RENAL FUNCTION PANEL
Albumin: 4.3 g/dL (ref 3.5–5.0)
Anion gap: 11 (ref 5–15)
BUN: 37 mg/dL — ABNORMAL HIGH (ref 6–20)
CO2: 24 mmol/L (ref 22–32)
Calcium: 9.9 mg/dL (ref 8.9–10.3)
Chloride: 106 mmol/L (ref 101–111)
Creatinine, Ser: 2.19 mg/dL — ABNORMAL HIGH (ref 0.44–1.00)
GFR calc Af Amer: 30 mL/min — ABNORMAL LOW (ref >60)
GFR calc non Af Amer: 26 mL/min — ABNORMAL LOW (ref >60)
Glucose, Bld: 78 mg/dL (ref 65–99)
Phosphorus: 3.6 mg/dL (ref 2.5–4.6)
Potassium: 4.3 mmol/L (ref 3.5–5.1)
Sodium: 141 mmol/L (ref 135–145)

## 2015-02-11 LAB — POCT HEMOGLOBIN-HEMACUE: Hemoglobin: 10.9 g/dL — ABNORMAL LOW (ref 12.0–15.0)

## 2015-02-11 MED ORDER — EPOETIN ALFA 40000 UNIT/ML IJ SOLN
INTRAMUSCULAR | Status: AC
  Administered 2015-02-11: 40000 [IU] via SUBCUTANEOUS
  Filled 2015-02-11: qty 1

## 2015-02-11 MED ORDER — EPOETIN ALFA 20000 UNIT/ML IJ SOLN: 40000.0000 [IU] | INTRAMUSCULAR | Status: DC

## 2015-03-08 ENCOUNTER — Encounter (HOSPITAL_COMMUNITY)
Admission: RE | Admit: 2015-03-08 | Discharge: 2015-03-08 | Disposition: A | Discharge status: Home / Self Care | Payer: Medicaid Other | Source: Ambulatory Visit | Attending: Nephrology | Admitting: Nephrology

## 2015-03-08 DIAGNOSIS — D631 Anemia in chronic kidney disease: Secondary | ICD-10-CM | POA: Diagnosis not present

## 2015-03-08 DIAGNOSIS — N185 Chronic kidney disease, stage 5: Secondary | ICD-10-CM | POA: Insufficient documentation

## 2015-03-08 DIAGNOSIS — Z940 Kidney transplant status: Secondary | ICD-10-CM | POA: Insufficient documentation

## 2015-03-08 DIAGNOSIS — D638 Anemia in other chronic diseases classified elsewhere: Secondary | ICD-10-CM | POA: Diagnosis present

## 2015-03-08 LAB — IRON AND TIBC
Iron: 84 ug/dL (ref 28–170)
Saturation Ratios: 38 % — ABNORMAL HIGH (ref 10.4–31.8)
TIBC: 221 ug/dL — ABNORMAL LOW (ref 250–450)
UIBC: 137 ug/dL

## 2015-03-08 LAB — FERRITIN: Ferritin: 1027 ng/mL — ABNORMAL HIGH (ref 11–307)

## 2015-03-08 MED ORDER — EPOETIN ALFA 20000 UNIT/ML IJ SOLN: 40000.0000 [IU] | INTRAMUSCULAR | Status: DC

## 2015-03-08 MED ORDER — EPOETIN ALFA 40000 UNIT/ML IJ SOLN
INTRAMUSCULAR | Status: AC
  Administered 2015-03-08: 40000 [IU] via SUBCUTANEOUS
  Filled 2015-03-08: qty 1

## 2015-03-11 LAB — POCT HEMOGLOBIN-HEMACUE: Hemoglobin: 9.7 g/dL — ABNORMAL LOW (ref 12.0–15.0)

## 2015-03-16 ENCOUNTER — Emergency Department (HOSPITAL_COMMUNITY)
Admission: EM | Admit: 2015-03-16 | Discharge: 2015-03-17 | Disposition: A | Discharge status: Home / Self Care | Payer: Medicaid Other | Attending: Emergency Medicine | Admitting: Emergency Medicine

## 2015-03-16 ENCOUNTER — Encounter (HOSPITAL_COMMUNITY): Payer: Self-pay

## 2015-03-16 DIAGNOSIS — Z8619 Personal history of other infectious and parasitic diseases: Secondary | ICD-10-CM | POA: Insufficient documentation

## 2015-03-16 DIAGNOSIS — M109 Gout, unspecified: Secondary | ICD-10-CM | POA: Diagnosis not present

## 2015-03-16 DIAGNOSIS — Y92410 Unspecified street and highway as the place of occurrence of the external cause: Secondary | ICD-10-CM | POA: Insufficient documentation

## 2015-03-16 DIAGNOSIS — Z7982 Long term (current) use of aspirin: Secondary | ICD-10-CM | POA: Insufficient documentation

## 2015-03-16 DIAGNOSIS — F329 Major depressive disorder, single episode, unspecified: Secondary | ICD-10-CM | POA: Diagnosis not present

## 2015-03-16 DIAGNOSIS — Z992 Dependence on renal dialysis: Secondary | ICD-10-CM | POA: Insufficient documentation

## 2015-03-16 DIAGNOSIS — F17210 Nicotine dependence, cigarettes, uncomplicated: Secondary | ICD-10-CM | POA: Diagnosis not present

## 2015-03-16 DIAGNOSIS — Z79899 Other long term (current) drug therapy: Secondary | ICD-10-CM | POA: Insufficient documentation

## 2015-03-16 DIAGNOSIS — Z8673 Personal history of transient ischemic attack (TIA), and cerebral infarction without residual deficits: Secondary | ICD-10-CM | POA: Diagnosis not present

## 2015-03-16 DIAGNOSIS — S161XXA Strain of muscle, fascia and tendon at neck level, initial encounter: Secondary | ICD-10-CM | POA: Diagnosis not present

## 2015-03-16 DIAGNOSIS — S4992XA Unspecified injury of left shoulder and upper arm, initial encounter: Secondary | ICD-10-CM | POA: Diagnosis not present

## 2015-03-16 DIAGNOSIS — K219 Gastro-esophageal reflux disease without esophagitis: Secondary | ICD-10-CM | POA: Diagnosis not present

## 2015-03-16 DIAGNOSIS — Y9389 Activity, other specified: Secondary | ICD-10-CM | POA: Diagnosis not present

## 2015-03-16 DIAGNOSIS — Z8679 Personal history of other diseases of the circulatory system: Secondary | ICD-10-CM | POA: Insufficient documentation

## 2015-03-16 DIAGNOSIS — S4991XA Unspecified injury of right shoulder and upper arm, initial encounter: Secondary | ICD-10-CM | POA: Insufficient documentation

## 2015-03-16 DIAGNOSIS — G8929 Other chronic pain: Secondary | ICD-10-CM | POA: Insufficient documentation

## 2015-03-16 DIAGNOSIS — N186 End stage renal disease: Secondary | ICD-10-CM | POA: Insufficient documentation

## 2015-03-16 DIAGNOSIS — H5441 Blindness, right eye, normal vision left eye: Secondary | ICD-10-CM | POA: Insufficient documentation

## 2015-03-16 DIAGNOSIS — Z7952 Long term (current) use of systemic steroids: Secondary | ICD-10-CM | POA: Diagnosis not present

## 2015-03-16 DIAGNOSIS — Z862 Personal history of diseases of the blood and blood-forming organs and certain disorders involving the immune mechanism: Secondary | ICD-10-CM | POA: Diagnosis not present

## 2015-03-16 DIAGNOSIS — S199XXA Unspecified injury of neck, initial encounter: Secondary | ICD-10-CM | POA: Diagnosis present

## 2015-03-16 DIAGNOSIS — Y999 Unspecified external cause status: Secondary | ICD-10-CM | POA: Insufficient documentation

## 2015-03-16 MED ORDER — ONDANSETRON 4 MG PO TBDP
4.0000 mg | ORAL_TABLET | Freq: Once | ORAL | Status: AC
  Administered 2015-03-16: 4 mg via ORAL
  Filled 2015-03-16: qty 1

## 2015-03-16 MED ORDER — OXYCODONE-ACETAMINOPHEN 7.5-325 MG PO TABS
1.0000 | ORAL_TABLET | Freq: Once | ORAL | Status: AC
  Administered 2015-03-16: 1 via ORAL
  Filled 2015-03-16: qty 1
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
## 2015-03-16 NOTE — ED Notes (Signed)
No answer in lobby.
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
## 2015-03-17 ENCOUNTER — Emergency Department (HOSPITAL_COMMUNITY): Payer: Medicaid Other

## 2015-03-17 NOTE — ED Notes (Signed)
EDP at bedside  

## 2015-03-17 NOTE — ED Notes (Signed)
Computer restarting, no e-sig available. Pt verbalized understanding DC instructions

## 2015-03-17 NOTE — Discharge Instructions (Signed)
Cervical Sprain  A cervical sprain is an injury in the neck in which the strong, fibrous tissues (ligaments) that connect your neck bones stretch or tear. Cervical sprains can range from mild to severe. Severe cervical sprains can cause the neck vertebrae to be unstable. This can lead to damage of the spinal cord and can result in serious nervous system problems. The amount of time it takes for a cervical sprain to get better depends on the cause and extent of the injury. Most cervical sprains heal in 1 to 3 weeks.  CAUSES   Severe cervical sprains may be caused by:    Contact sport injuries (such as from football, rugby, wrestling, hockey, auto racing, gymnastics, diving, martial arts, or boxing).    Motor vehicle collisions.    Whiplash injuries. This is an injury from a sudden forward and backward whipping movement of the head and neck.   Falls.   Mild cervical sprains may be caused by:    Being in an awkward position, such as while cradling a telephone between your ear and shoulder.    Sitting in a chair that does not offer proper support.    Working at a poorly designed computer station.    Looking up or down for long periods of time.   SYMPTOMS    Pain, soreness, stiffness, or a burning sensation in the front, back, or sides of the neck. This discomfort may develop immediately after the injury or slowly, 24 hours or more after the injury.    Pain or tenderness directly in the middle of the back of the neck.    Shoulder or upper back pain.    Limited ability to move the neck.    Headache.    Dizziness.    Weakness, numbness, or tingling in the hands or arms.    Muscle spasms.    Difficulty swallowing or chewing.    Tenderness and swelling of the neck.   DIAGNOSIS   Most of the time your health care provider can diagnose a cervical sprain by taking your history and doing a physical exam. Your health care provider will ask about previous neck injuries and any known neck  problems, such as arthritis in the neck. X-rays may be taken to find out if there are any other problems, such as with the bones of the neck. Other tests, such as a CT scan or MRI, may also be needed.   TREATMENT   Treatment depends on the severity of the cervical sprain. Mild sprains can be treated with rest, keeping the neck in place (immobilization), and pain medicines. Severe cervical sprains are immediately immobilized. Further treatment is done to help with pain, muscle spasms, and other symptoms and may include:   Medicines, such as pain relievers, numbing medicines, or muscle relaxants.    Physical therapy. This may involve stretching exercises, strengthening exercises, and posture training. Exercises and improved posture can help stabilize the neck, strengthen muscles, and help stop symptoms from returning.   HOME CARE INSTRUCTIONS    Put ice on the injured area.     Put ice in a plastic bag.     Place a towel between your skin and the bag.     Leave the ice on for 15-20 minutes, 3-4 times a day.    If your injury was severe, you may have been given a cervical collar to wear. A cervical collar is a two-piece collar designed to keep your neck from moving while it heals.      Do not remove the collar unless instructed by your health care provider.    If you have long hair, keep it outside of the collar.    Ask your health care provider before making any adjustments to your collar. Minor adjustments may be required over time to improve comfort and reduce pressure on your chin or on the back of your head.    Ifyou are allowed to remove the collar for cleaning or bathing, follow your health care provider's instructions on how to do so safely.    Keep your collar clean by wiping it with mild soap and water and drying it completely. If the collar you have been given includes removable pads, remove them every 1-2 days and hand wash them with soap and water. Allow them to air dry. They should be completely  dry before you wear them in the collar.    If you are allowed to remove the collar for cleaning and bathing, wash and dry the skin of your neck. Check your skin for irritation or sores. If you see any, tell your health care provider.    Do not drive while wearing the collar.    Only take over-the-counter or prescription medicines for pain, discomfort, or fever as directed by your health care provider.    Keep all follow-up appointments as directed by your health care provider.    Keep all physical therapy appointments as directed by your health care provider.    Make any needed adjustments to your workstation to promote good posture.    Avoid positions and activities that make your symptoms worse.    Warm up and stretch before being active to help prevent problems.   SEEK MEDICAL CARE IF:    Your pain is not controlled with medicine.    You are unable to decrease your pain medicine over time as planned.    Your activity level is not improving as expected.   SEEK IMMEDIATE MEDICAL CARE IF:    You develop any bleeding.   You develop stomach upset.   You have signs of an allergic reaction to your medicine.    Your symptoms get worse.    You develop new, unexplained symptoms.    You have numbness, tingling, weakness, or paralysis in any part of your body.   MAKE SURE YOU:    Understand these instructions.   Will watch your condition.   Will get help right away if you are not doing well or get worse.     This information is not intended to replace advice given to you by your health care provider. Make sure you discuss any questions you have with your health care provider.     Document Released: 11/16/2006 Document Revised: 01/24/2013 Document Reviewed: 07/27/2012  Elsevier Interactive Patient Education 2016 [employer]

## 2015-03-18 ENCOUNTER — Ambulatory Visit: Payer: Medicaid Other | Attending: Neurosurgery | Admitting: Physical Therapy

## 2015-03-18 DIAGNOSIS — R29898 Other symptoms and signs involving the musculoskeletal system: Secondary | ICD-10-CM | POA: Diagnosis present

## 2015-03-18 DIAGNOSIS — M436 Torticollis: Secondary | ICD-10-CM | POA: Insufficient documentation

## 2015-03-18 DIAGNOSIS — IMO0002 Reserved for concepts with insufficient information to code with codable children: Secondary | ICD-10-CM

## 2015-03-18 DIAGNOSIS — M6248 Contracture of muscle, other site: Secondary | ICD-10-CM | POA: Insufficient documentation

## 2015-03-18 DIAGNOSIS — M5010 Cervical disc disorder with radiculopathy, unspecified cervical region: Secondary | ICD-10-CM | POA: Diagnosis present

## 2015-03-18 DIAGNOSIS — M62838 Other muscle spasm: Secondary | ICD-10-CM

## 2015-03-18 DIAGNOSIS — M4322 Fusion of spine, cervical region: Secondary | ICD-10-CM | POA: Diagnosis present
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
## 2015-03-18 NOTE — Patient Instructions (Signed)
AROM: Neck Rotation   Turn head slowly to look over one shoulder, then the other. Hold each position __10-15__ seconds. Repeat ___3-5_ times per set. Do __1__ sets per session. Do __2__ sessions per day.  http://orth.exer.us/294   Copyright  VHI. All rights reserved.  AROM: Lateral Neck Flexion   Slowly tilt head toward one shoulder, then the other. Hold each position ___10-15_ seconds. Repeat ___3-5_ times per set. Do ___1_ sets per session. Do __2__ sessions per day.  http://orth.exer.us/296   Copyright  VHI. All rights reserved.  Extension   Hands behind neck, bend head back as far as is comfortable. Hold ___10-15_ seconds. Repeat __3-5__ times. Do _2___ sessions per day.  Copyright  VHI. All rights reserved.  AROM: Neck Flexion   Bend head forward. Hold _10-15___ seconds. Repeat __3-5_ times per set. Do __1__ sets per session. Do __2__ sessions per day.  http://orth.exer.us/298   Copyright  VHI. All rights reserved.    Copyright  VHI. All rights reserved.  Cane Horizontal - Supine   Copyright  VHI. All rights reserved.  Cane Exercise: Flexion   Lie on back, holding cane above chest. Keeping arms as straight as possible, lower cane toward floor beyond head. Hold _10___ seconds. Repeat __5-10__ times. Do __2__ sessions per day.  http://gt2.exer.us/91   Copyright  VHI. All rights reserved.

## 2015-04-04 ENCOUNTER — Other Ambulatory Visit (HOSPITAL_COMMUNITY): Payer: Self-pay | Admitting: *Deleted

## 2015-04-05 ENCOUNTER — Encounter (HOSPITAL_COMMUNITY)
Admission: RE | Admit: 2015-04-05 | Discharge: 2015-04-05 | Disposition: A | Discharge status: Home / Self Care | Payer: Medicaid Other | Source: Ambulatory Visit | Attending: Nephrology | Admitting: Nephrology

## 2015-04-05 DIAGNOSIS — N185 Chronic kidney disease, stage 5: Secondary | ICD-10-CM | POA: Diagnosis not present

## 2015-04-05 DIAGNOSIS — D631 Anemia in chronic kidney disease: Secondary | ICD-10-CM | POA: Diagnosis not present

## 2015-04-05 DIAGNOSIS — Z940 Kidney transplant status: Secondary | ICD-10-CM | POA: Diagnosis not present

## 2015-04-05 DIAGNOSIS — D638 Anemia in other chronic diseases classified elsewhere: Secondary | ICD-10-CM | POA: Diagnosis present

## 2015-04-05 LAB — RENAL FUNCTION PANEL
Albumin: 4.2 g/dL (ref 3.5–5.0)
Anion gap: 12 (ref 5–15)
BUN: 33 mg/dL — ABNORMAL HIGH (ref 6–20)
CO2: 26 mmol/L (ref 22–32)
Calcium: 9.9 mg/dL (ref 8.9–10.3)
Chloride: 103 mmol/L (ref 101–111)
Creatinine, Ser: 2.39 mg/dL — ABNORMAL HIGH (ref 0.44–1.00)
GFR calc Af Amer: 27 mL/min — ABNORMAL LOW (ref >60)
GFR calc non Af Amer: 23 mL/min — ABNORMAL LOW (ref >60)
Glucose, Bld: 80 mg/dL (ref 65–99)
Phosphorus: 4.1 mg/dL (ref 2.5–4.6)
Potassium: 4.7 mmol/L (ref 3.5–5.1)
Sodium: 141 mmol/L (ref 135–145)

## 2015-04-05 LAB — POCT HEMOGLOBIN-HEMACUE: Hemoglobin: 10.3 g/dL — ABNORMAL LOW (ref 12.0–15.0)

## 2015-04-05 MED ORDER — EPOETIN ALFA 40000 UNIT/ML IJ SOLN
INTRAMUSCULAR | Status: AC
  Administered 2015-04-05: 40000 [IU]
  Filled 2015-04-05: qty 1

## 2015-04-05 MED ORDER — EPOETIN ALFA 20000 UNIT/ML IJ SOLN: 40000.0000 [IU] | INTRAMUSCULAR | Status: DC

## 2015-05-10 ENCOUNTER — Encounter (HOSPITAL_COMMUNITY): Payer: Self-pay | Admitting: *Deleted

## 2015-05-10 ENCOUNTER — Emergency Department (HOSPITAL_COMMUNITY)
Admission: EM | Admit: 2015-05-10 | Discharge: 2015-05-11 | Disposition: A | Discharge status: Home / Self Care | Payer: Medicaid Other | Attending: Emergency Medicine | Admitting: Emergency Medicine

## 2015-05-10 ENCOUNTER — Encounter (HOSPITAL_COMMUNITY): Admission: RE | Admit: 2015-05-10 | Payer: Medicaid Other | Source: Ambulatory Visit

## 2015-05-10 DIAGNOSIS — D649 Anemia, unspecified: Secondary | ICD-10-CM | POA: Diagnosis not present

## 2015-05-10 DIAGNOSIS — H5442 Blindness, left eye, normal vision right eye: Secondary | ICD-10-CM | POA: Diagnosis not present

## 2015-05-10 DIAGNOSIS — F419 Anxiety disorder, unspecified: Secondary | ICD-10-CM | POA: Diagnosis not present

## 2015-05-10 DIAGNOSIS — I4891 Unspecified atrial fibrillation: Secondary | ICD-10-CM | POA: Diagnosis not present

## 2015-05-10 DIAGNOSIS — Z940 Kidney transplant status: Secondary | ICD-10-CM | POA: Insufficient documentation

## 2015-05-10 DIAGNOSIS — F17210 Nicotine dependence, cigarettes, uncomplicated: Secondary | ICD-10-CM | POA: Diagnosis not present

## 2015-05-10 DIAGNOSIS — Z992 Dependence on renal dialysis: Secondary | ICD-10-CM | POA: Insufficient documentation

## 2015-05-10 DIAGNOSIS — Z8619 Personal history of other infectious and parasitic diseases: Secondary | ICD-10-CM | POA: Insufficient documentation

## 2015-05-10 DIAGNOSIS — K3184 Gastroparesis: Secondary | ICD-10-CM | POA: Insufficient documentation

## 2015-05-10 DIAGNOSIS — G8929 Other chronic pain: Secondary | ICD-10-CM | POA: Insufficient documentation

## 2015-05-10 DIAGNOSIS — M109 Gout, unspecified: Secondary | ICD-10-CM | POA: Insufficient documentation

## 2015-05-10 DIAGNOSIS — Z7952 Long term (current) use of systemic steroids: Secondary | ICD-10-CM | POA: Insufficient documentation

## 2015-05-10 DIAGNOSIS — Z79899 Other long term (current) drug therapy: Secondary | ICD-10-CM | POA: Insufficient documentation

## 2015-05-10 DIAGNOSIS — F329 Major depressive disorder, single episode, unspecified: Secondary | ICD-10-CM | POA: Diagnosis not present

## 2015-05-10 DIAGNOSIS — M549 Dorsalgia, unspecified: Secondary | ICD-10-CM | POA: Insufficient documentation

## 2015-05-10 DIAGNOSIS — N186 End stage renal disease: Secondary | ICD-10-CM | POA: Insufficient documentation

## 2015-05-10 DIAGNOSIS — K219 Gastro-esophageal reflux disease without esophagitis: Secondary | ICD-10-CM | POA: Diagnosis not present

## 2015-05-10 DIAGNOSIS — Z8673 Personal history of transient ischemic attack (TIA), and cerebral infarction without residual deficits: Secondary | ICD-10-CM | POA: Diagnosis not present

## 2015-05-10 DIAGNOSIS — R112 Nausea with vomiting, unspecified: Secondary | ICD-10-CM | POA: Diagnosis present

## 2015-05-10 DIAGNOSIS — Z7982 Long term (current) use of aspirin: Secondary | ICD-10-CM | POA: Diagnosis not present

## 2015-05-10 LAB — COMPREHENSIVE METABOLIC PANEL
ALT: 14 U/L (ref 14–54)
AST: 29 U/L (ref 15–41)
Albumin: 4.3 g/dL (ref 3.5–5.0)
Alkaline Phosphatase: 90 U/L (ref 38–126)
Anion gap: 15 (ref 5–15)
BUN: 28 mg/dL — ABNORMAL HIGH (ref 6–20)
CO2: 21 mmol/L — ABNORMAL LOW (ref 22–32)
Calcium: 10.5 mg/dL — ABNORMAL HIGH (ref 8.9–10.3)
Chloride: 107 mmol/L (ref 101–111)
Creatinine, Ser: 1.98 mg/dL — ABNORMAL HIGH (ref 0.44–1.00)
GFR calc Af Amer: 34 mL/min — ABNORMAL LOW (ref >60)
GFR calc non Af Amer: 29 mL/min — ABNORMAL LOW (ref >60)
Glucose, Bld: 114 mg/dL — ABNORMAL HIGH (ref 65–99)
Potassium: 4 mmol/L (ref 3.5–5.1)
Sodium: 143 mmol/L (ref 135–145)
Total Bilirubin: 0.8 mg/dL (ref 0.3–1.2)
Total Protein: 6.9 g/dL (ref 6.5–8.1)

## 2015-05-10 LAB — CBC
HCT: 31.4 % — ABNORMAL LOW (ref 36.0–46.0)
Hemoglobin: 10.4 g/dL — ABNORMAL LOW (ref 12.0–15.0)
MCH: 30.5 pg (ref 26.0–34.0)
MCHC: 33.1 g/dL (ref 30.0–36.0)
MCV: 92.1 fL (ref 78.0–100.0)
Platelets: 172 10*3/uL (ref 150–400)
RBC: 3.41 MIL/uL — ABNORMAL LOW (ref 3.87–5.11)
RDW: 19 % — ABNORMAL HIGH (ref 11.5–15.5)
WBC: 6 10*3/uL (ref 4.0–10.5)

## 2015-05-10 LAB — I-STAT CG4 LACTIC ACID, ED: Lactic Acid, Venous: 3.04 mmol/L (ref 0.5–2.0)

## 2015-05-10 LAB — LIPASE, BLOOD: Lipase: 37 U/L (ref 11–51)

## 2015-05-10 NOTE — ED Notes (Signed)
The pt is acting strange  She is jerking and stiffening up over her entire body  And she talks during these episodes.  C/o nausea.  The female with her keeps trying to  Keep her in the chair.  She is not answering questions.  Kidney transplant  Old fistula lt arm

## 2015-05-10 NOTE — ED Notes (Signed)
MD busy in another room. PA informed that pt lactic acid is 3.04  No new orders at this time.

## 2015-05-10 NOTE — ED Provider Notes (Signed)
CSN: [id number]     Arrival date & time 05/10/15  2155 History  By signing my name below, I, Tina Watkins, attest that this documentation has been prepared under the direction and in the presence of Daleen Bo, MD. Electronically Signed: Rayna Watkins, ED Scribe. 05/10/2015. 12:02 AM.   Chief Complaint  Patient presents with  . Emesis   The history is provided by the patient and a relative. No language interpreter was used.    HPI Comments: Tina Watkins is a 50 y.o. female with a PMHx of gastroparesis and stroke who presents to the Emergency Department complaining of constant, moderate, n/v onset 1 day ago. She reports associated, diffuse, moderate, abd pain. Pt notes receiving Reglan and dilaudid in the past for her symptoms which she says typically provides relief. She confirms her listed PCP. Pt notes that Dr. Ardis Hughs has evaluated her for gastroparesis but denies they have determined a cause. Her relative notes she has a SHx to her back. She denies any other associated symptoms at this time.   Past Medical History  Diagnosis Date  . Gastroparesis   . Gout   . Hypotension   . Herpes genitalia 1994  . E coli bacteremia 06/18/2011  . Bacteremia due to Gram-negative bacteria ([place]) 05/23/2011  . History of blood transfusion     "more than a few times" (12/11/2011)  . History of stomach ulcers   . Stroke [facility])      left basal ganglia lacunar infarct; Right frontal lobe lacunar infarct.  . Stroke [facility]) ~ 1999; 2001    "briefly lost my vision; lost my right eye" (12/11/2011)  . Chronic lower back pain   . Depression   . Osteopenia   . Headache(784.0)     "not often anymore" (12/11/2011)  . ESRD (end stage renal disease) ([place]) 06/12/2011  . Iron deficiency anemia   . Seizures ([place]) 1994    "post transplant; only have had that one" (12/11/2011)  . GERD (gastroesophageal reflux disease)   . DDD (degenerative disc disease), cervical   . Dysrhythmia     "tachycardia" (12/11/2011) new  onset afib 10/15/14 EKG  . New onset a-fib ([place])     10/15/14 EKG  . Spinal stenosis in cervical region   . H/O carpal tunnel syndrome   . Fibromyalgia   . Glaucoma     right eye  . Blind right eye   . Complication of anesthesia     "woke up during OR; I have an extremely high tolerance" (12/11/2011) 1 procedure was graft; the other procedures were procedures that are typically done with sedation.   Past Surgical History  Procedure Laterality Date  . Kidney transplant  1994; 1999; 2005    "right"  . Appendectomy  ~ 2004  . 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 Total nephrectomy  1988?; 1994; 2005  . Multiple tooth extractions    . Enucleation  2001    "right"  . Esophagogastroduodenoscopy (egd) with propofol N/A 04/21/2012    Procedure: ESOPHAGOGASTRODUODENOSCOPY (EGD) WITH PROPOFOL;  Surgeon: Milus Banister, MD;  Location: [facility];  Service: Endoscopy;  Laterality: N/A;  . Tonsillectomy    . Colonoscopy    . Cataract extraction      right eye  . Anterior cervical decomp/discectomy fusion N/A 01/08/2015    Procedure: Anterior Cervical Three-Four/Four-Five Decompression/Diskectomy/Fusion;  Surgeon: Leeroy Cha, MD;  Location: [facility]  NEURO ORS;  Service: Neurosurgery;  Laterality: N/A;  C3-4 C4-5 Anterior cervical decompression/diskectomy/fusion   Family History  Problem Relation Age of Onset  . Hypertension Maternal Grandmother   . Glaucoma Mother   . Multiple sclerosis Brother    Social History  Substance Use Topics  . Smoking status: Current Every Day Smoker -- 0.50 packs/day for 28 years    Types: Cigarettes  . Smokeless tobacco: Never Used     Comment: had quit 03/05/2011  . Alcohol Use: Yes     Comment: rare   OB History    No data available     Review of Systems  Gastrointestinal: Positive for nausea, vomiting and abdominal pain.  Musculoskeletal: Positive for back pain.  All other systems  reviewed and are negative.  Allergies  Levofloxacin  Home Medications   Prior to Admission medications   Medication Sig Start Date End Date Taking? Authorizing Provider  ALPRAZolam (XANAX) 0.25 MG tablet Take 0.25 mg by mouth daily as needed (FOR SVT).    Historical Provider, MD  aspirin EC 81 MG tablet Take 81 mg by mouth daily.    Historical Provider, MD  azaTHIOprine (IMURAN) 50 MG tablet Take 100 mg by mouth daily.     Historical Provider, MD  colchicine 0.6 MG tablet Take 0.6 mg by mouth daily as needed (For gout.).    Historical Provider, MD  eszopiclone (LUNESTA) 1 MG TABS tablet Take 1 mg by mouth at bedtime as needed for sleep. Take immediately before bedtime    Historical Provider, MD  furosemide (LASIX) 40 MG tablet Take 40 mg by mouth daily.  06/22/11   [name], MD  metoCLOPramide (REGLAN) 5 MG tablet TAKE 1 TABLET (5 MG TOTAL) BY MOUTH AT BEDTIME AND MAY REPEAT DOSE ONE TIME IF NEEDED. Patient taking differently: TAKES 1 TAB BY MOUTH TWICE DAILY 08/27/14   Milus Banister, MD  omeprazole (PRILOSEC) 40 MG capsule Take 40 mg by mouth 2 (two) times daily.     Historical Provider, MD  oxyCODONE-acetaminophen (PERCOCET) 10-325 MG tablet Take 1 tablet by mouth every 6 (six) hours as needed for pain.    Historical Provider, MD  predniSONE (DELTASONE) 5 MG tablet Take 5 mg by mouth daily. 06/22/11   [name], MD  Prenatal Vit-Fe Fumarate-FA (PRENATAL VITAMIN PO) Take 1 tablet by mouth daily.     Historical Provider, MD  tacrolimus (PROGRAF) 1 MG capsule Take 3 mg by mouth 2 (two) times daily.  06/22/11   [name], MD  zolpidem (AMBIEN) 10 MG tablet Take 10 mg by mouth at bedtime as needed for sleep.    Historical Provider, MD   BP 115/67 mmHg  Pulse 64  Temp(Src) 98.7 F (37.1 C) (Oral)  Resp 17  SpO2 99% Physical Exam  Constitutional: She is oriented to person, place, and time. She appears well-developed.  Chronically ill appearing, older than  stated age.  HENT:  Head: Normocephalic and atraumatic.  Eyes: Conjunctivae and EOM are normal. Pupils are equal, round, and reactive to light.  Right eye: globe atrophied, pupil misshapen and cornea opaque  Neck: Normal range of motion and phonation normal. Neck supple.  Cardiovascular: Normal rate and regular rhythm.   Pulmonary/Chest: Effort normal and breath sounds normal. She has no wheezes. She has no rales. She exhibits no tenderness.  Abdominal: Soft. There is tenderness (diffuse, mild). There is no guarding.  Hyperactive BS  Musculoskeletal: Normal range of motion. She exhibits tenderness.  Diffuse musculoskeletal tenderness of the back   Neurological: She is alert and oriented to person, place, and time. She exhibits normal muscle tone.  Skin: Skin is warm and dry.  Psychiatric: She has a normal mood and affect. Her behavior is normal.  anxious  Nursing note and vitals reviewed.  ED Course  Procedures  DIAGNOSTIC STUDIES: Oxygen Saturation is 99% on RA, normal by my interpretation.    COORDINATION OF CARE: 11:59 PM Discussed next steps with pt. Pt requested Reglan and dilaudid for her pain. She verbalized understanding and is agreeable with the plan.    Medications  sodium chloride 0.9 % bolus 1,000 mL (0 mLs Intravenous Stopped 05/11/15 0249)  metoCLOPramide (REGLAN) injection 10 mg (10 mg Intramuscular Given 05/11/15 0038)  HYDROmorphone (DILAUDID) injection 1 mg (1 mg Intravenous Given 05/11/15 0038)  HYDROmorphone (DILAUDID) injection 1 mg (1 mg Intravenous Given 05/11/15 0249)    Patient Vitals for the past 24 hrs:  BP Temp Temp src Pulse Resp SpO2  05/10/15 2300 115/67 mmHg - - 64 17 99 %  05/10/15 2245 - - - - 18 -  05/10/15 2230 122/90 mmHg - - - 23 -  05/10/15 2200 117/100 mmHg 98.7 F (37.1 C) Oral 84 (!) 30 98 %    3:26 AM Reevaluation with update and discussion. After initial assessment and treatment, an updated evaluation reveals She feels much better. She is  alert, ambulatory and has no further complaints. Findings discussed with the patient, all questions were answered. Lakhia Gengler L    Labs Review Labs Reviewed  COMPREHENSIVE METABOLIC PANEL - Abnormal; Notable for the following:    CO2 21 (*)    Glucose, Bld 114 (*)    BUN 28 (*)    Creatinine, Ser 1.98 (*)    Calcium 10.5 (*)    GFR calc non Af Amer 29 (*)    GFR calc Af Amer 34 (*)    All other components within normal limits  CBC - Abnormal; Notable for the following:    RBC 3.41 (*)    Hemoglobin 10.4 (*)    HCT 31.4 (*)    RDW 19.0 (*)    All other components within normal limits  I-STAT CG4 LACTIC ACID, ED - Abnormal; Notable for the following:    Lactic Acid, Venous 3.04 (*)    All other components within normal limits  LIPASE, BLOOD  I-STAT CG4 LACTIC ACID, ED    Imaging Review No results found. I have personally reviewed and evaluated these images and lab results as part of my medical decision-making.   EKG Interpretation None      MDM   Final diagnoses:  Gastroparesis   N/V with hx of gastroparesis, last emptyig Study ,2014, showed mild gastroparesis. Patient improved after treatment in the emergency department. Doubt serious bacterial infection. Metabolic instability or impending vascular collapse.  Nursing Notes Reviewed/ Care Coordinated Applicable Imaging Reviewed Interpretation of Laboratory Data incorporated into ED treatment  The patient appears reasonably screened and/or stabilized for discharge and I doubt any other medical condition or other [facility] requiring further screening, evaluation, or treatment in the ED at this time prior to discharge.  Plan: Home Medications- usual; Home Treatments- rest; return here if the recommended treatment, does not improve the symptoms; Recommended follow up- PCP prn   I personally performed the services described in this documentation, which was scribed in my presence. The recorded information has been reviewed  and is accurate.      Vira Agar  Eulis Foster, MD 05/11/15 [id number]

## 2015-05-11 LAB — I-STAT CG4 LACTIC ACID, ED: Lactic Acid, Venous: 1.27 mmol/L (ref 0.5–2.0)

## 2015-05-11 MED ORDER — METOCLOPRAMIDE HCL 5 MG/ML IJ SOLN
10.0000 mg | Freq: Once | INTRAMUSCULAR | Status: AC
  Administered 2015-05-11: 10 mg via INTRAMUSCULAR
  Filled 2015-05-11: qty 2

## 2015-05-11 MED ORDER — HYDROMORPHONE HCL 1 MG/ML IJ SOLN
1.0000 mg | Freq: Once | INTRAMUSCULAR | Status: AC
  Administered 2015-05-11: 1 mg via INTRAVENOUS
  Filled 2015-05-11: qty 1

## 2015-05-11 MED ORDER — SODIUM CHLORIDE 0.9 % IV BOLUS (SEPSIS)
1000.0000 mL | Freq: Once | INTRAVENOUS | Status: AC
  Administered 2015-05-11: 1000 mL via INTRAVENOUS

## 2015-05-11 NOTE — Discharge Instructions (Signed)
Gastroparesis °Gastroparesis, also called delayed gastric emptying, is a condition in which food takes longer than normal to empty from the stomach. The condition is usually long-lasting (chronic). °CAUSES °This condition may be caused by: °· An endocrine disorder, such as hypothyroidism or diabetes. Diabetes is the most common cause of this condition. °· A nervous system disease, such as Parkinson disease or multiple sclerosis. °· Cancer, infection, or surgery of the stomach or vagus nerve. °· A connective tissue disorder, such as scleroderma. °· Certain medicines. °In most cases, the cause is not known. °RISK FACTORS °This condition is more likely to develop in: °· People with certain disorders, including endocrine disorders, eating disorders, amyloidosis, and scleroderma. °· People with certain diseases, including Parkinson disease or multiple sclerosis. °· People with cancer or infection of the stomach or vagus nerve. °· People who have had surgery on the stomach or vagus nerve. °· People who take certain medicines. °· Women. °SYMPTOMS °Symptoms of this condition include: °· An early feeling of fullness when eating. °· Nausea. °· Weight loss. °· Vomiting. °· Heartburn. °· Abdominal bloating. °· Inconsistent blood glucose levels. °· Lack of appetite. °· Acid from the stomach coming up into the esophagus (gastroesophageal reflux). °· Spasms of the stomach. °Symptoms may come and go. °DIAGNOSIS °This condition is diagnosed with tests, such as: °· Tests that check how long it takes food to move through the stomach and intestines. These tests include: °¨ Upper gastrointestinal (GI) series. In this test, X-rays of the intestines are taken after you drink a liquid. The liquid makes the intestines show up better on the X-rays. °¨ Gastric emptying scintigraphy. In this test, scans are taken after you eat food that contains a small amount of radioactive material. °¨ Wireless capsule GI monitoring system. This test  involves swallowing a capsule that records information about movement through the stomach. °· Gastric manometry. This test measures electrical and muscular activity in the stomach. It is done with a thin tube that is passed down the throat and into the stomach. °· Endoscopy. This test checks for abnormalities in the lining of the stomach. It is done with a long, thin tube that is passed down the throat and into the stomach. °· An ultrasound. This test can help rule out gallbladder disease or pancreatitis as a cause of your symptoms. It uses sound waves to take pictures of the inside of your body. °TREATMENT °There is no cure for gastroparesis. This condition may be managed with: °· Treatment of the underlying condition causing the gastroparesis. °· Lifestyle changes, including exercise and dietary changes. Dietary changes can include: °¨ Changes in what and when you eat. °¨ Eating smaller meals more often. °¨ Eating low-fat foods. °¨ Eating low-fiber forms of high-fiber foods, such as cooked vegetables instead of raw vegetables. °¨ Having liquid foods in place of solid foods. Liquid foods are easier to digest. °· Medicines. These may be given to control nausea and vomiting and to stimulate stomach muscles. °· Getting food through a feeding tube. This may be done in severe cases. °· A gastric neurostimulator. This is a device that is inserted into the body with surgery. It helps improve stomach emptying and control nausea and vomiting. °HOME CARE INSTRUCTIONS °· Follow your health care provider's instructions about exercise and diet. °· Take medicines only as directed by your health care provider. °SEEK MEDICAL CARE IF: °· Your symptoms do not improve with treatment. °· You have new symptoms. °SEEK IMMEDIATE MEDICAL CARE IF: °· You have   severe abdominal pain that does not improve with treatment. °· You have nausea that does not go away. °· You cannot keep fluids down. °  °This information is not intended to replace  advice given to you by your health care provider. Make sure you discuss any questions you have with your health care provider. °  °Document Released: 01/19/2005 Document Revised: 06/05/2014 Document Reviewed: 01/15/2014 °Elsevier Interactive Patient Education ©2016 [employer] ° °

## 2015-05-12 ENCOUNTER — Encounter (HOSPITAL_COMMUNITY): Payer: Self-pay | Admitting: Emergency Medicine

## 2015-05-12 ENCOUNTER — Emergency Department (HOSPITAL_COMMUNITY)
Admission: EM | Admit: 2015-05-12 | Discharge: 2015-05-12 | Disposition: A | Discharge status: Home / Self Care | Payer: Medicaid Other | Attending: Emergency Medicine | Admitting: Emergency Medicine

## 2015-05-12 DIAGNOSIS — M109 Gout, unspecified: Secondary | ICD-10-CM | POA: Diagnosis not present

## 2015-05-12 DIAGNOSIS — R109 Unspecified abdominal pain: Secondary | ICD-10-CM | POA: Diagnosis present

## 2015-05-12 DIAGNOSIS — Z8673 Personal history of transient ischemic attack (TIA), and cerebral infarction without residual deficits: Secondary | ICD-10-CM | POA: Insufficient documentation

## 2015-05-12 DIAGNOSIS — Z7982 Long term (current) use of aspirin: Secondary | ICD-10-CM | POA: Insufficient documentation

## 2015-05-12 DIAGNOSIS — Z940 Kidney transplant status: Secondary | ICD-10-CM | POA: Diagnosis not present

## 2015-05-12 DIAGNOSIS — K3184 Gastroparesis: Secondary | ICD-10-CM | POA: Diagnosis not present

## 2015-05-12 DIAGNOSIS — Z7952 Long term (current) use of systemic steroids: Secondary | ICD-10-CM | POA: Diagnosis not present

## 2015-05-12 DIAGNOSIS — N186 End stage renal disease: Secondary | ICD-10-CM | POA: Insufficient documentation

## 2015-05-12 DIAGNOSIS — D509 Iron deficiency anemia, unspecified: Secondary | ICD-10-CM | POA: Diagnosis not present

## 2015-05-12 DIAGNOSIS — I4891 Unspecified atrial fibrillation: Secondary | ICD-10-CM | POA: Insufficient documentation

## 2015-05-12 DIAGNOSIS — H5441 Blindness, right eye, normal vision left eye: Secondary | ICD-10-CM | POA: Insufficient documentation

## 2015-05-12 DIAGNOSIS — Z8619 Personal history of other infectious and parasitic diseases: Secondary | ICD-10-CM | POA: Insufficient documentation

## 2015-05-12 DIAGNOSIS — Z992 Dependence on renal dialysis: Secondary | ICD-10-CM | POA: Insufficient documentation

## 2015-05-12 DIAGNOSIS — F329 Major depressive disorder, single episode, unspecified: Secondary | ICD-10-CM | POA: Insufficient documentation

## 2015-05-12 DIAGNOSIS — K219 Gastro-esophageal reflux disease without esophagitis: Secondary | ICD-10-CM | POA: Insufficient documentation

## 2015-05-12 DIAGNOSIS — F17210 Nicotine dependence, cigarettes, uncomplicated: Secondary | ICD-10-CM | POA: Insufficient documentation

## 2015-05-12 DIAGNOSIS — G8929 Other chronic pain: Secondary | ICD-10-CM | POA: Diagnosis not present

## 2015-05-12 DIAGNOSIS — Z79899 Other long term (current) drug therapy: Secondary | ICD-10-CM | POA: Insufficient documentation

## 2015-05-12 LAB — CBC WITH DIFFERENTIAL/PLATELET
Basophils Absolute: 0 10*3/uL (ref 0.0–0.1)
Basophils Relative: 0 %
Eosinophils Absolute: 0.1 10*3/uL (ref 0.0–0.7)
Eosinophils Relative: 2 %
HCT: 27.2 % — ABNORMAL LOW (ref 36.0–46.0)
Hemoglobin: 8.7 g/dL — ABNORMAL LOW (ref 12.0–15.0)
Lymphocytes Relative: 12 %
Lymphs Abs: 0.5 10*3/uL — ABNORMAL LOW (ref 0.7–4.0)
MCH: 30.1 pg (ref 26.0–34.0)
MCHC: 32 g/dL (ref 30.0–36.0)
MCV: 94.1 fL (ref 78.0–100.0)
Monocytes Absolute: 0.4 10*3/uL (ref 0.1–1.0)
Monocytes Relative: 9 %
Neutro Abs: 3.5 10*3/uL (ref 1.7–7.7)
Neutrophils Relative %: 77 %
Platelets: 149 10*3/uL — ABNORMAL LOW (ref 150–400)
RBC: 2.89 MIL/uL — ABNORMAL LOW (ref 3.87–5.11)
RDW: 19 % — ABNORMAL HIGH (ref 11.5–15.5)
WBC: 4.6 10*3/uL (ref 4.0–10.5)

## 2015-05-12 LAB — COMPREHENSIVE METABOLIC PANEL
ALT: 16 U/L (ref 14–54)
AST: 25 U/L (ref 15–41)
Albumin: 3.8 g/dL (ref 3.5–5.0)
Alkaline Phosphatase: 72 U/L (ref 38–126)
Anion gap: 10 (ref 5–15)
BUN: 30 mg/dL — ABNORMAL HIGH (ref 6–20)
CO2: 21 mmol/L — ABNORMAL LOW (ref 22–32)
Calcium: 9.5 mg/dL (ref 8.9–10.3)
Chloride: 111 mmol/L (ref 101–111)
Creatinine, Ser: 2.39 mg/dL — ABNORMAL HIGH (ref 0.44–1.00)
GFR calc Af Amer: 27 mL/min — ABNORMAL LOW (ref >60)
GFR calc non Af Amer: 23 mL/min — ABNORMAL LOW (ref >60)
Glucose, Bld: 88 mg/dL (ref 65–99)
Potassium: 3.9 mmol/L (ref 3.5–5.1)
Sodium: 142 mmol/L (ref 135–145)
Total Bilirubin: 1 mg/dL (ref 0.3–1.2)
Total Protein: 6.1 g/dL — ABNORMAL LOW (ref 6.5–8.1)

## 2015-05-12 LAB — LIPASE, BLOOD: Lipase: 30 U/L (ref 11–51)

## 2015-05-12 MED ORDER — SODIUM CHLORIDE 0.9 % IV SOLN
1000.0000 mL | Freq: Once | INTRAVENOUS | Status: AC
  Administered 2015-05-12: 1000 mL via INTRAVENOUS

## 2015-05-12 MED ORDER — METOCLOPRAMIDE HCL 5 MG/ML IJ SOLN
10.0000 mg | Freq: Once | INTRAMUSCULAR | Status: AC
  Administered 2015-05-12: 10 mg via INTRAVENOUS
  Filled 2015-05-12: qty 2

## 2015-05-12 MED ORDER — HYDROMORPHONE HCL 1 MG/ML IJ SOLN
1.0000 mg | Freq: Once | INTRAMUSCULAR | Status: AC
  Administered 2015-05-12: 1 mg via INTRAVENOUS
  Filled 2015-05-12: qty 1

## 2015-05-12 MED ORDER — SODIUM CHLORIDE 0.9 % IV SOLN: 1000.0000 mL | INTRAVENOUS | Status: DC

## 2015-05-12 NOTE — ED Notes (Signed)
Pt here with abd pain and N/V with hx of gastroparesis; pt refusing and answer questions and is hyperventilating

## 2015-05-12 NOTE — ED Provider Notes (Signed)
CSN: [id number]     Arrival date & time 05/12/15  1818 History   First MD Initiated Contact with Patient 05/12/15 1834     Chief Complaint  Patient presents with  . Abdominal Pain     (Consider location/radiation/quality/duration/timing/severity/associated sxs/prior Treatment) HPI   50 year old female with history of gastroparesis, chronic low back pain, end-stage renal disease s/p kidney transplant, fibromyalgia presenting for evaluation of abdominal pain. Patient report pain across her abdomen which she describes a sharp throbbing achy 10 out of 10 pain with nausea, and dry heaves has been ongoing all day today. Patient was seen yesterday for the same complaints of abdominal pain along with nausea and vomiting similar to her gastroparesis. She received Reglan and Dilaudid in the ER with improvement of the symptoms and subsequently discharge. Patient states she usually received 2 L of saline fluid whenever she has a flareup like this but she'll receive 1 L yesterday and she felt that her symptoms return because she was not treated appropriately. She admits that she has problem with gastroparesis for the past 5 years with occasional flareup similar to the one that she has yesterday and today. She believes it may be due to recent medication that she had for a dental extraction 2 days ago. She denies having fever, chest pain, shortness of breath, dysuria or rash. It is difficult to get a history from patient. Her GI specialist is Dr. Ardis Hughs.-Patient has been emptying study in 2014 which did show mild gastroparesis.  Past Medical History  Diagnosis Date  . Gastroparesis   . Gout   . Hypotension   . Herpes genitalia 1994  . E coli bacteremia 06/18/2011  . Bacteremia due to Gram-negative bacteria ([place]) 05/23/2011  . History of blood transfusion     "more than a few times" (12/11/2011)  . History of stomach ulcers   . Stroke [place])      left basal ganglia lacunar infarct; Right frontal lobe lacunar  infarct.  . Stroke [facility]) ~ 1999; 2001    "briefly lost my vision; lost my right eye" (12/11/2011)  . Chronic lower back pain   . Depression   . Osteopenia   . Headache(784.0)     "not often anymore" (12/11/2011)  . ESRD (end stage renal disease) ([place]) 06/12/2011  . Iron deficiency anemia   . Seizures ([place]) 1994    "post transplant; only have had that one" (12/11/2011)  . GERD (gastroesophageal reflux disease)   . DDD (degenerative disc disease), cervical   . Dysrhythmia     "tachycardia" (12/11/2011) new onset afib 10/15/14 EKG  . New onset a-fib ([place])     10/15/14 EKG  . Spinal stenosis in cervical region   . H/O carpal tunnel syndrome   . Fibromyalgia   . Glaucoma     right eye  . Blind right eye   . Complication of anesthesia     "woke up during OR; I have an extremely high tolerance" (12/11/2011) 1 procedure was graft; the other procedures were procedures that are typically done with sedation.   Past Surgical History  Procedure Laterality Date  . Kidney transplant  1994; 1999; 2005    "right"  . Appendectomy  ~ 2004  . 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 Total nephrectomy  1988?; 1994; 2005  . Multiple tooth extractions    . Enucleation  2001    "right"  .  Esophagogastroduodenoscopy (egd) with propofol N/A 04/21/2012    Procedure: ESOPHAGOGASTRODUODENOSCOPY (EGD) WITH PROPOFOL;  Surgeon: Milus Banister, MD;  [facility];  Service: Endoscopy;  Laterality: N/A;  . Tonsillectomy    . Colonoscopy    . Cataract extraction      right eye  . Anterior cervical decomp/discectomy fusion N/A 01/08/2015    Procedure: Anterior Cervical Three-Four/Four-Five Decompression/Diskectomy/Fusion;  Surgeon: Leeroy Cha, MD;  Location: [place] NEURO ORS;  Service: Neurosurgery;  Laterality: N/A;  C3-4 C4-5 Anterior cervical decompression/diskectomy/fusion   Family History  Problem Relation Age of Onset  . Hypertension  Maternal Grandmother   . Glaucoma Mother   . Multiple sclerosis Brother    Social History  Substance Use Topics  . Smoking status: Current Every Day Smoker -- 0.50 packs/day for 28 years    Types: Cigarettes  . Smokeless tobacco: Never Used     Comment: had quit 03/05/2011  . Alcohol Use: Yes     Comment: rare   OB History    No data available     Review of Systems  All other systems reviewed and are negative.     Allergies  Levofloxacin  Home Medications   Prior to Admission medications   Medication Sig Start Date End Date Taking? Authorizing Provider  ALPRAZolam (XANAX) 0.25 MG tablet Take 0.25 mg by mouth daily as needed (FOR SVT).    Historical Provider, MD  aspirin EC 81 MG tablet Take 81 mg by mouth daily.    Historical Provider, MD  azaTHIOprine (IMURAN) 50 MG tablet Take 100 mg by mouth daily.     Historical Provider, MD  colchicine 0.6 MG tablet Take 0.6 mg by mouth daily as needed (For gout.).    Historical Provider, MD  eszopiclone (LUNESTA) 1 MG TABS tablet Take 1 mg by mouth at bedtime as needed for sleep. Take immediately before bedtime    Historical Provider, MD  furosemide (LASIX) 40 MG tablet Take 40 mg by mouth daily.  06/22/11   [name], MD  metoCLOPramide (REGLAN) 5 MG tablet TAKE 1 TABLET (5 MG TOTAL) BY MOUTH AT BEDTIME AND MAY REPEAT DOSE ONE TIME IF NEEDED. Patient taking differently: TAKES 1 TAB BY MOUTH TWICE DAILY 08/27/14   Milus Banister, MD  omeprazole (PRILOSEC) 40 MG capsule Take 40 mg by mouth 2 (two) times daily.     Historical Provider, MD  oxyCODONE-acetaminophen (PERCOCET) 10-325 MG tablet Take 1 tablet by mouth every 6 (six) hours as needed for pain.    Historical Provider, MD  predniSONE (DELTASONE) 5 MG tablet Take 5 mg by mouth daily. 06/22/11   [name], MD  Prenatal Vit-Fe Fumarate-FA (PRENATAL VITAMIN PO) Take 1 tablet by mouth daily.     Historical Provider, MD  tacrolimus (PROGRAF) 1 MG capsule Take 3 mg by  mouth 2 (two) times daily.  06/22/11   [name], MD  zolpidem (AMBIEN) 10 MG tablet Take 10 mg by mouth at bedtime as needed for sleep.    Historical Provider, MD   BP 134/85 mmHg  Pulse 82  Temp(Src) 98.3 F (36.8 C) (Oral)  Resp 26  SpO2 100% Physical Exam  Constitutional: She appears well-developed and well-nourished. No distress.  Chronically ill-appearing Caucasian female appears older than her  stated age. She is writhing in bed, appears uncomfortable and dry heaving.  No content noted in the emesis bag.  HENT:  Head: Atraumatic.  Oral cavity is dry  Eyes: Conjunctivae are normal.  Neck: Neck supple.  Cardiovascular: Normal rate and regular rhythm.   Pulmonary/Chest: Effort normal and breath sounds normal.  Abdominal: Soft. There is tenderness (Diffuse abdominal tenderness without guarding or rebound tenderness.).  Neurological: She is alert.  Skin: No rash noted.  Nonfunctioning AV fistula to left arm  Psychiatric: She has a normal mood and affect.  Nursing note and vitals reviewed.   ED Course  Procedures (including critical care time) Labs Review Labs Reviewed  CBC WITH DIFFERENTIAL/PLATELET - Abnormal; Notable for the following:    RBC 2.89 (*)    Hemoglobin 8.7 (*)    HCT 27.2 (*)    RDW 19.0 (*)    Platelets 149 (*)    Lymphs Abs 0.5 (*)    All other components within normal limits  COMPREHENSIVE METABOLIC PANEL - Abnormal; Notable for the following:    CO2 21 (*)    BUN 30 (*)    Creatinine, Ser 2.39 (*)    Total Protein 6.1 (*)    GFR calc non Af Amer 23 (*)    GFR calc Af Amer 27 (*)    All other components within normal limits  LIPASE, BLOOD  URINALYSIS, ROUTINE W REFLEX MICROSCOPIC (NOT AT [facility])  POC URINE PREG, ED  POC OCCULT BLOOD, ED    Imaging Review No results found. I have personally reviewed and evaluated these images and lab results as part of my medical decision-making.   EKG Interpretation None      MDM   Final  diagnoses:  Gastroparesis    BP 134/85 mmHg  Pulse 82  Temp(Src) 98.3 F (36.8 C) (Oral)  Resp 26  SpO2 100%   6:51 PM Patient with history of gastroparesis here with diffuse abdominal pain nausea and vomiting similar to prior gastroparesis flareup. She was seen yesterday for the same complaint. Plan to treat her symptoms. Low suspicion for acute abdominal pathology.  10:39 PM Patient's hemoglobin is 8.7, lower than her baseline. Patient refused to have a fecal occult blood test done stating "I know my hemoglobin is low, I did not get my shot last week" Evidence of renal failure with a creatinine of 2.39 which is near her baseline. Lipase is normal. Patient is currently receiving IV fluid along with pain medication and Reglan. Her vital signs stable.  11:24 PM Patient felt much better, able to tolerates by mouth, requesting to be discharged. Patient will follow with her primary care provider for further care. Return precaution discussed.  Domenic Moras, PA-C 05/12/15 2324  Merrily Pew, MD 05/12/15 [phone]

## 2015-05-12 NOTE — ED Notes (Signed)
Pt refused to have occult blood completed b/c "I know my hemoglobin is low...  I did not get my shot last week."

## 2015-05-12 NOTE — Discharge Instructions (Signed)
Gastroparesis °Gastroparesis, also called delayed gastric emptying, is a condition in which food takes longer than normal to empty from the stomach. The condition is usually long-lasting (chronic). °CAUSES °This condition may be caused by: °· An endocrine disorder, such as hypothyroidism or diabetes. Diabetes is the most common cause of this condition. °· A nervous system disease, such as Parkinson disease or multiple sclerosis. °· Cancer, infection, or surgery of the stomach or vagus nerve. °· A connective tissue disorder, such as scleroderma. °· Certain medicines. °In most cases, the cause is not known. °RISK FACTORS °This condition is more likely to develop in: °· People with certain disorders, including endocrine disorders, eating disorders, amyloidosis, and scleroderma. °· People with certain diseases, including Parkinson disease or multiple sclerosis. °· People with cancer or infection of the stomach or vagus nerve. °· People who have had surgery on the stomach or vagus nerve. °· People who take certain medicines. °· Women. °SYMPTOMS °Symptoms of this condition include: °· An early feeling of fullness when eating. °· Nausea. °· Weight loss. °· Vomiting. °· Heartburn. °· Abdominal bloating. °· Inconsistent blood glucose levels. °· Lack of appetite. °· Acid from the stomach coming up into the esophagus (gastroesophageal reflux). °· Spasms of the stomach. °Symptoms may come and go. °DIAGNOSIS °This condition is diagnosed with tests, such as: °· Tests that check how long it takes food to move through the stomach and intestines. These tests include: °¨ Upper gastrointestinal (GI) series. In this test, X-rays of the intestines are taken after you drink a liquid. The liquid makes the intestines show up better on the X-rays. °¨ Gastric emptying scintigraphy. In this test, scans are taken after you eat food that contains a small amount of radioactive material. °¨ Wireless capsule GI monitoring system. This test  involves swallowing a capsule that records information about movement through the stomach. °· Gastric manometry. This test measures electrical and muscular activity in the stomach. It is done with a thin tube that is passed down the throat and into the stomach. °· Endoscopy. This test checks for abnormalities in the lining of the stomach. It is done with a long, thin tube that is passed down the throat and into the stomach. °· An ultrasound. This test can help rule out gallbladder disease or pancreatitis as a cause of your symptoms. It uses sound waves to take pictures of the inside of your body. °TREATMENT °There is no cure for gastroparesis. This condition may be managed with: °· Treatment of the underlying condition causing the gastroparesis. °· Lifestyle changes, including exercise and dietary changes. Dietary changes can include: °¨ Changes in what and when you eat. °¨ Eating smaller meals more often. °¨ Eating low-fat foods. °¨ Eating low-fiber forms of high-fiber foods, such as cooked vegetables instead of raw vegetables. °¨ Having liquid foods in place of solid foods. Liquid foods are easier to digest. °· Medicines. These may be given to control nausea and vomiting and to stimulate stomach muscles. °· Getting food through a feeding tube. This may be done in severe cases. °· A gastric neurostimulator. This is a device that is inserted into the body with surgery. It helps improve stomach emptying and control nausea and vomiting. °HOME CARE INSTRUCTIONS °· Follow your health care provider's instructions about exercise and diet. °· Take medicines only as directed by your health care provider. °SEEK MEDICAL CARE IF: °· Your symptoms do not improve with treatment. °· You have new symptoms. °SEEK IMMEDIATE MEDICAL CARE IF: °· You have   severe abdominal pain that does not improve with treatment. °· You have nausea that does not go away. °· You cannot keep fluids down. °  °This information is not intended to replace  advice given to you by your health care provider. Make sure you discuss any questions you have with your health care provider. °  °Document Released: 01/19/2005 Document Revised: 06/05/2014 Document Reviewed: 01/15/2014 °Elsevier Interactive Patient Education ©2016 [employer] ° °

## 2015-05-12 NOTE — ED Notes (Signed)
Called lab, blood "on machine now", informed provider

## 2015-05-20 ENCOUNTER — Encounter (HOSPITAL_COMMUNITY)
Admission: RE | Admit: 2015-05-20 | Discharge: 2015-05-20 | Disposition: A | Discharge status: Home / Self Care | Payer: Medicaid Other | Source: Ambulatory Visit | Attending: Nephrology | Admitting: Nephrology

## 2015-05-20 DIAGNOSIS — D631 Anemia in chronic kidney disease: Secondary | ICD-10-CM | POA: Insufficient documentation

## 2015-05-20 DIAGNOSIS — Z940 Kidney transplant status: Secondary | ICD-10-CM | POA: Insufficient documentation

## 2015-05-20 DIAGNOSIS — N185 Chronic kidney disease, stage 5: Secondary | ICD-10-CM | POA: Insufficient documentation

## 2015-05-20 DIAGNOSIS — D638 Anemia in other chronic diseases classified elsewhere: Secondary | ICD-10-CM | POA: Diagnosis present

## 2015-05-20 LAB — RENAL FUNCTION PANEL
Albumin: 4.1 g/dL (ref 3.5–5.0)
Anion gap: 11 (ref 5–15)
BUN: 25 mg/dL — ABNORMAL HIGH (ref 6–20)
CO2: 24 mmol/L (ref 22–32)
Calcium: 9.8 mg/dL (ref 8.9–10.3)
Chloride: 109 mmol/L (ref 101–111)
Creatinine, Ser: 2.04 mg/dL — ABNORMAL HIGH (ref 0.44–1.00)
GFR calc Af Amer: 32 mL/min — ABNORMAL LOW (ref >60)
GFR calc non Af Amer: 28 mL/min — ABNORMAL LOW (ref >60)
Glucose, Bld: 91 mg/dL (ref 65–99)
Phosphorus: 3.2 mg/dL (ref 2.5–4.6)
Potassium: 3.4 mmol/L — ABNORMAL LOW (ref 3.5–5.1)
Sodium: 144 mmol/L (ref 135–145)

## 2015-05-20 LAB — IRON AND TIBC
Iron: 64 ug/dL (ref 28–170)
Saturation Ratios: 31 % (ref 10.4–31.8)
TIBC: 206 ug/dL — ABNORMAL LOW (ref 250–450)
UIBC: 142 ug/dL

## 2015-05-20 LAB — FERRITIN: Ferritin: 949 ng/mL — ABNORMAL HIGH (ref 11–307)

## 2015-05-20 LAB — POCT HEMOGLOBIN-HEMACUE: Hemoglobin: 9.8 g/dL — ABNORMAL LOW (ref 12.0–15.0)

## 2015-05-20 MED ORDER — EPOETIN ALFA 20000 UNIT/ML IJ SOLN: 40000.0000 [IU] | INTRAMUSCULAR | Status: DC

## 2015-05-20 MED ORDER — EPOETIN ALFA 40000 UNIT/ML IJ SOLN
INTRAMUSCULAR | Status: AC
  Administered 2015-05-20: 40000 [IU]
  Filled 2015-05-20: qty 1

## 2015-06-07 ENCOUNTER — Encounter (HOSPITAL_COMMUNITY)
Admission: RE | Admit: 2015-06-07 | Discharge: 2015-06-07 | Disposition: A | Discharge status: Home / Self Care | Payer: Medicaid Other | Source: Ambulatory Visit | Attending: Nephrology | Admitting: Nephrology

## 2015-06-07 DIAGNOSIS — N185 Chronic kidney disease, stage 5: Secondary | ICD-10-CM | POA: Insufficient documentation

## 2015-06-07 DIAGNOSIS — D631 Anemia in chronic kidney disease: Secondary | ICD-10-CM | POA: Insufficient documentation

## 2015-06-07 DIAGNOSIS — Z940 Kidney transplant status: Secondary | ICD-10-CM | POA: Diagnosis not present

## 2015-06-07 DIAGNOSIS — D638 Anemia in other chronic diseases classified elsewhere: Secondary | ICD-10-CM | POA: Diagnosis present

## 2015-06-07 LAB — POCT HEMOGLOBIN-HEMACUE: Hemoglobin: 10.5 g/dL — ABNORMAL LOW (ref 12.0–15.0)

## 2015-06-07 MED ORDER — EPOETIN ALFA 40000 UNIT/ML IJ SOLN
INTRAMUSCULAR | Status: AC
  Administered 2015-06-07: 40000 [IU]
  Filled 2015-06-07: qty 1

## 2015-06-07 MED ORDER — EPOETIN ALFA 20000 UNIT/ML IJ SOLN
40000.0000 [IU] | INTRAMUSCULAR | Status: DC
Start: 2015-06-07 — End: 2015-06-08

## 2015-06-17 ENCOUNTER — Emergency Department (HOSPITAL_COMMUNITY): Payer: Medicaid Other

## 2015-06-17 ENCOUNTER — Emergency Department (HOSPITAL_COMMUNITY)
Admission: EM | Admit: 2015-06-17 | Discharge: 2015-06-18 | Disposition: A | Discharge status: Home / Self Care | Payer: Medicaid Other | Attending: Emergency Medicine | Admitting: Emergency Medicine

## 2015-06-17 ENCOUNTER — Encounter (HOSPITAL_COMMUNITY): Payer: Self-pay | Admitting: *Deleted

## 2015-06-17 DIAGNOSIS — N186 End stage renal disease: Secondary | ICD-10-CM | POA: Insufficient documentation

## 2015-06-17 DIAGNOSIS — G8929 Other chronic pain: Secondary | ICD-10-CM | POA: Insufficient documentation

## 2015-06-17 DIAGNOSIS — R079 Chest pain, unspecified: Secondary | ICD-10-CM | POA: Insufficient documentation

## 2015-06-17 DIAGNOSIS — K219 Gastro-esophageal reflux disease without esophagitis: Secondary | ICD-10-CM | POA: Diagnosis not present

## 2015-06-17 DIAGNOSIS — R112 Nausea with vomiting, unspecified: Secondary | ICD-10-CM

## 2015-06-17 DIAGNOSIS — Z862 Personal history of diseases of the blood and blood-forming organs and certain disorders involving the immune mechanism: Secondary | ICD-10-CM | POA: Insufficient documentation

## 2015-06-17 DIAGNOSIS — M109 Gout, unspecified: Secondary | ICD-10-CM | POA: Diagnosis not present

## 2015-06-17 DIAGNOSIS — Z8673 Personal history of transient ischemic attack (TIA), and cerebral infarction without residual deficits: Secondary | ICD-10-CM | POA: Diagnosis not present

## 2015-06-17 DIAGNOSIS — I120 Hypertensive chronic kidney disease with stage 5 chronic kidney disease or end stage renal disease: Secondary | ICD-10-CM | POA: Insufficient documentation

## 2015-06-17 DIAGNOSIS — Z7982 Long term (current) use of aspirin: Secondary | ICD-10-CM | POA: Diagnosis not present

## 2015-06-17 DIAGNOSIS — F329 Major depressive disorder, single episode, unspecified: Secondary | ICD-10-CM | POA: Insufficient documentation

## 2015-06-17 DIAGNOSIS — Z940 Kidney transplant status: Secondary | ICD-10-CM | POA: Diagnosis not present

## 2015-06-17 DIAGNOSIS — F17210 Nicotine dependence, cigarettes, uncomplicated: Secondary | ICD-10-CM | POA: Diagnosis not present

## 2015-06-17 DIAGNOSIS — H5441 Blindness, right eye, normal vision left eye: Secondary | ICD-10-CM | POA: Diagnosis not present

## 2015-06-17 DIAGNOSIS — K3184 Gastroparesis: Secondary | ICD-10-CM | POA: Insufficient documentation

## 2015-06-17 DIAGNOSIS — I4891 Unspecified atrial fibrillation: Secondary | ICD-10-CM | POA: Insufficient documentation

## 2015-06-17 DIAGNOSIS — H409 Unspecified glaucoma: Secondary | ICD-10-CM | POA: Diagnosis not present

## 2015-06-17 DIAGNOSIS — Z79899 Other long term (current) drug therapy: Secondary | ICD-10-CM | POA: Diagnosis not present

## 2015-06-17 DIAGNOSIS — Z8619 Personal history of other infectious and parasitic diseases: Secondary | ICD-10-CM | POA: Insufficient documentation

## 2015-06-17 DIAGNOSIS — Z7952 Long term (current) use of systemic steroids: Secondary | ICD-10-CM | POA: Insufficient documentation

## 2015-06-17 LAB — COMPREHENSIVE METABOLIC PANEL
ALT: 12 U/L — ABNORMAL LOW (ref 14–54)
AST: 21 U/L (ref 15–41)
Albumin: 4.4 g/dL (ref 3.5–5.0)
Alkaline Phosphatase: 94 U/L (ref 38–126)
Anion gap: 10 (ref 5–15)
BUN: 27 mg/dL — ABNORMAL HIGH (ref 6–20)
CO2: 22 mmol/L (ref 22–32)
Calcium: 10.3 mg/dL (ref 8.9–10.3)
Chloride: 110 mmol/L (ref 101–111)
Creatinine, Ser: 2.19 mg/dL — ABNORMAL HIGH (ref 0.44–1.00)
GFR calc Af Amer: 30 mL/min — ABNORMAL LOW (ref >60)
GFR calc non Af Amer: 26 mL/min — ABNORMAL LOW (ref >60)
Glucose, Bld: 99 mg/dL (ref 65–99)
Potassium: 4 mmol/L (ref 3.5–5.1)
Sodium: 142 mmol/L (ref 135–145)
Total Bilirubin: 0.7 mg/dL (ref 0.3–1.2)
Total Protein: 6.9 g/dL (ref 6.5–8.1)

## 2015-06-17 LAB — I-STAT TROPONIN, ED: Troponin i, poc: 0.02 ng/mL (ref 0.00–0.08)

## 2015-06-17 LAB — CBC
HCT: 35.1 % — ABNORMAL LOW (ref 36.0–46.0)
Hemoglobin: 11.6 g/dL — ABNORMAL LOW (ref 12.0–15.0)
MCH: 31.1 pg (ref 26.0–34.0)
MCHC: 33 g/dL (ref 30.0–36.0)
MCV: 94.1 fL (ref 78.0–100.0)
Platelets: 173 10*3/uL (ref 150–400)
RBC: 3.73 MIL/uL — ABNORMAL LOW (ref 3.87–5.11)
RDW: 19.8 % — ABNORMAL HIGH (ref 11.5–15.5)
WBC: 5.1 10*3/uL (ref 4.0–10.5)

## 2015-06-17 LAB — LIPASE, BLOOD: Lipase: 28 U/L (ref 11–51)

## 2015-06-17 MED ORDER — SODIUM CHLORIDE 0.9 % IV BOLUS (SEPSIS)
1000.0000 mL | Freq: Once | INTRAVENOUS | Status: AC
  Administered 2015-06-18: 1000 mL via INTRAVENOUS

## 2015-06-17 MED ORDER — SODIUM CHLORIDE 0.9 % IV BOLUS (SEPSIS)
1000.0000 mL | Freq: Once | INTRAVENOUS | Status: AC
  Administered 2015-06-17: 1000 mL via INTRAVENOUS

## 2015-06-17 MED ORDER — METOCLOPRAMIDE HCL 5 MG/ML IJ SOLN
10.0000 mg | INTRAMUSCULAR | Status: AC
  Administered 2015-06-17: 10 mg via INTRAVENOUS
  Filled 2015-06-17: qty 2

## 2015-06-17 MED ORDER — DICYCLOMINE HCL 10 MG/ML IM SOLN
20.0000 mg | Freq: Once | INTRAMUSCULAR | Status: AC
  Administered 2015-06-17: 20 mg via INTRAMUSCULAR
  Filled 2015-06-17: qty 2

## 2015-06-17 MED ORDER — HYDROMORPHONE HCL 1 MG/ML IJ SOLN
1.0000 mg | Freq: Once | INTRAMUSCULAR | Status: AC | PRN
  Administered 2015-06-18: 1 mg via INTRAVENOUS
  Filled 2015-06-17: qty 1

## 2015-06-17 MED ORDER — HYDROMORPHONE HCL 1 MG/ML IJ SOLN: 1.0000 mg | Freq: Once | INTRAMUSCULAR | Status: DC

## 2015-06-17 NOTE — ED Notes (Signed)
Patient transported to X-ray 

## 2015-06-17 NOTE — ED Notes (Addendum)
Offered pt zofran at triage but she states that it will not help her, she requests reglan.

## 2015-06-17 NOTE — ED Provider Notes (Signed)
CSN: [id number]     Arrival date & time 06/17/15  1748 History   First MD Initiated Contact with Patient 06/17/15 2112     Chief Complaint  Patient presents with  . Emesis  . Chest Pain     (Consider location/radiation/quality/duration/timing/severity/associated sxs/prior Treatment) HPI Comments: 50 year old female with a history of gastroparesis, stroke, chronic low back pain, end-stage renal disease, esophageal reflux, and fiber myalgia presents to the emergency department for evaluation of nausea, vomiting, and abdominal pain she reports that she has had constant throbbing, aching, and stabbing pain diffusely to her abdomen as well as to her ribs and back. Symptoms began this morning and have been associated with chills and numerous to count episodes of emesis. She states that she usually gets relief from Dilaudid and Reglan 2. She is followed by Dr. Ardis Hughs gastroenterology. She has had no associated fever, urinary symptoms, hematemesis, melena, or hematochezia. Her last bowel movement was yesterday. Abdominal surgical history significant for appendectomy and right kidney transplant.  Patient is a 50 y.o. female presenting with vomiting and chest pain. The history is provided by the patient. No language interpreter was used.  Emesis Associated symptoms: abdominal pain and chills   Chest Pain Associated symptoms: abdominal pain, nausea and vomiting   Associated symptoms: no fever     Past Medical History  Diagnosis Date  . Gastroparesis   . Gout   . Hypotension   . Herpes genitalia 1994  . E coli bacteremia 06/18/2011  . Bacteremia due to Gram-negative bacteria ([place]) 05/23/2011  . History of blood transfusion     "more than a few times" (12/11/2011)  . History of stomach ulcers   . Stroke [facility])      left basal ganglia lacunar infarct; Right frontal lobe lacunar infarct.  . Stroke [facility]) ~ 1999; 2001    "briefly lost my vision; lost my right eye" (12/11/2011)  . Chronic lower back  pain   . Depression   . Osteopenia   . Headache(784.0)     "not often anymore" (12/11/2011)  . ESRD (end stage renal disease) ([place]) 06/12/2011  . Iron deficiency anemia   . Seizures ([place]) 1994    "post transplant; only have had that one" (12/11/2011)  . GERD (gastroesophageal reflux disease)   . DDD (degenerative disc disease), cervical   . Dysrhythmia     "tachycardia" (12/11/2011) new onset afib 10/15/14 EKG  . New onset a-fib ([place])     10/15/14 EKG  . Spinal stenosis in cervical region   . H/O carpal tunnel syndrome   . Fibromyalgia   . Glaucoma     right eye  . Blind right eye   . Complication of anesthesia     "woke up during OR; I have an extremely high tolerance" (12/11/2011) 1 procedure was graft; the other procedures were procedures that are typically done with sedation.  . Gastroparesis    Past Surgical History  Procedure Laterality Date  . Kidney transplant  1994; 1999; 2005    "right"  . Appendectomy  ~ 2004  . 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 Total nephrectomy  1988?; 1994; 2005  . Multiple tooth extractions    . Enucleation  2001    "right"  . Esophagogastroduodenoscopy (egd) with propofol N/A 04/21/2012    Procedure: ESOPHAGOGASTRODUODENOSCOPY (EGD) WITH PROPOFOL;  Surgeon: Milus Banister, MD;  Location: [facility] ENDOSCOPY;  Service:  Endoscopy;  Laterality: N/A;  . Tonsillectomy    . Colonoscopy    . Cataract extraction      right eye  . Anterior cervical decomp/discectomy fusion N/A 01/08/2015    Procedure: Anterior Cervical Three-Four/Four-Five Decompression/Diskectomy/Fusion;  Surgeon: Leeroy Cha, MD;  Location: [place] [facility];  Service: Neurosurgery;  Laterality: N/A;  C3-4 C4-5 Anterior cervical decompression/diskectomy/fusion   Family History  Problem Relation Age of Onset  . Hypertension Maternal Grandmother   . Glaucoma Mother   . Multiple sclerosis Brother    Social History   Substance Use Topics  . Smoking status: Current Every Day Smoker -- 0.50 packs/day for 28 years    Types: Cigarettes  . Smokeless tobacco: Never Used     Comment: had quit 03/05/2011  . Alcohol Use: Yes     Comment: rare   OB History    No data available      Review of Systems  Constitutional: Positive for chills. Negative for fever.  Cardiovascular: Positive for chest pain.  Gastrointestinal: Positive for nausea, vomiting and abdominal pain. Negative for blood in stool.  All other systems reviewed and are negative.   Allergies  Levofloxacin  Home Medications   Prior to Admission medications   Medication Sig Start Date End Date Taking? Authorizing Provider  ALPRAZolam (XANAX) 0.25 MG tablet Take 0.25 mg by mouth daily as needed (FOR SVT).   Yes Historical Provider, MD  aspirin EC 81 MG tablet Take 81 mg by mouth daily.   Yes Historical Provider, MD  azaTHIOprine (IMURAN) 50 MG tablet Take 100 mg by mouth daily.    Yes Historical Provider, MD  colchicine 0.6 MG tablet Take 0.6 mg by mouth daily as needed (For gout.).   Yes Historical Provider, MD  eszopiclone (LUNESTA) 1 MG TABS tablet Take 1 mg by mouth at bedtime as needed for sleep. Take immediately before bedtime   Yes Historical Provider, MD  furosemide (LASIX) 40 MG tablet Take 40 mg by mouth daily.  06/22/11  Yes [name], MD  omeprazole (PRILOSEC) 40 MG capsule Take 40 mg by mouth 2 (two) times daily.    Yes Historical Provider, MD  oxyCODONE-acetaminophen (PERCOCET) 10-325 MG tablet Take 1 tablet by mouth every 6 (six) hours as needed for pain.   Yes Historical Provider, MD  predniSONE (DELTASONE) 5 MG tablet Take 5 mg by mouth daily. 06/22/11  Yes [name], MD  Prenatal Vit-Fe Fumarate-FA (PRENATAL VITAMIN PO) Take 1 tablet by mouth daily.    Yes Historical Provider, MD  tacrolimus (PROGRAF) 1 MG capsule Take 3 mg by mouth 2 (two) times daily.  06/22/11  Yes [name], MD  zolpidem (AMBIEN)  10 MG tablet Take 10 mg by mouth at bedtime as needed for sleep.   Yes Historical Provider, MD  metoCLOPramide (REGLAN) 5 MG tablet TAKE 1 TABLET (5 MG TOTAL) BY MOUTH AT BEDTIME AND MAY REPEAT DOSE ONE TIME IF NEEDED. Patient not taking: Reported on 06/17/2015 08/27/14   Milus Banister, MD   BP 122/65 mmHg  Pulse 68  Temp(Src) 98.7 F (37.1 C) (Oral)  Resp 20  SpO2 100%   Physical Exam  Constitutional: She is oriented to person, place, and time. She appears well-developed and well-nourished. No distress.  Patient appears uncomfortable  HENT:  Head: Normocephalic and atraumatic.  Dry mm  Eyes: EOM are normal. No scleral icterus.  Neck: Normal range of motion.  Cardiovascular: Normal rate, regular rhythm and intact distal pulses.  Pulmonary/Chest: Effort normal and breath sounds normal. No respiratory distress. She has no wheezes. She has no rales.  Respirations even and unlabored. Lungs CTAB.  Abdominal: She exhibits no distension. There is tenderness. There is no rebound and no guarding.  Hypoactive bowel sounds in all quadrants. Abdomen is soft with diffuse tenderness. No masses, guarding, or rigidity. No peritoneal signs.  Musculoskeletal: Normal range of motion.  Neurological: She is alert and oriented to person, place, and time. She exhibits normal muscle tone. Coordination normal.  Skin: Skin is warm and dry. No rash noted. She is not diaphoretic. No erythema. No pallor.  Psychiatric: She has a normal mood and affect. Her behavior is normal.  Nursing note and vitals reviewed.   ED Course  Procedures (including critical care time) Labs Review Labs Reviewed  COMPREHENSIVE METABOLIC PANEL - Abnormal; Notable for the following:    BUN 27 (*)    Creatinine, Ser 2.19 (*)    ALT 12 (*)    GFR calc non Af Amer 26 (*)    GFR calc Af Amer 30 (*)    All other components within normal limits  CBC - Abnormal; Notable for the following:    RBC 3.73 (*)    Hemoglobin 11.6 (*)     HCT 35.1 (*)    RDW 19.8 (*)    All other components within normal limits  URINALYSIS, ROUTINE W REFLEX MICROSCOPIC (NOT AT [facility]) - Abnormal; Notable for the following:    APPearance CLOUDY (*)    Hgb urine dipstick SMALL (*)    Ketones, ur 15 (*)    Protein, ur 30 (*)    All other components within normal limits  URINE MICROSCOPIC-ADD ON - Abnormal; Notable for the following:    Squamous Epithelial / LPF 0-5 (*)    Bacteria, UA RARE (*)    All other components within normal limits  LIPASE, BLOOD  I-STAT TROPOININ, ED    Imaging Review Dg Abd Acute W/chest  06/17/2015  CLINICAL DATA:  Abdominal pain, nausea and vomiting today, convulsions, history stroke, end-stage renal disease, fibromyalgia, smoking EXAM: DG ABDOMEN ACUTE W/ 1V CHEST COMPARISON:  Chest and abdominal radiographs of 01/25/2014 FINDINGS: SVC stent again identified. Upper normal heart size. Mediastinal contours and pulmonary vascularity normal. Lungs appear hyperinflated but clear. No infiltrate, pleural effusion or pneumothorax. Surgical clips in both upper quadrants and in pelvis bilaterally. Nonobstructive bowel gas pattern. No bowel dilatation, bowel wall thickening, or free intraperitoneal air. Bones demineralized. Scattered vascular calcifications and phleboliths in pelvis. No definite urinary tract calcification. Bones demineralized with evidence of interval cervical spine fusion. IMPRESSION: No acute abnormalities. Electronically Signed   By: Lavonia Dana M.D.   On: 06/17/2015 22:46     I have personally reviewed and evaluated these images and lab results as part of my medical decision-making.   EKG Interpretation None       12:06 AM Patient reassessed; sleeping. Reports she wants to hold Dilaudid until IVF complete. Will try and put on pump to improve flow Labs c/w baseline. No leukocytosis. No evidence of obstruction. Symptoms most c/w gastroparesis. Will continue to monitor and reassess.  1:12 AM PO  challenge ordered  1:39 AM Per nurse, patient tolerating POs. States she feels better and is ready for d/c.  MDM   Final diagnoses:  Nausea and vomiting  Gastroparesis    50 year old female presents to the emergency department for evaluation of vomiting and diarrhea with associated generalized abdominal pain. She reports that  her symptoms feel consistent with her past flares of gastroparesis. She is followed by GI. No evidence of acute surgical abdomen on exam. Laboratory workup is reassuring and consistent with baseline. No leukocytosis or fever today. X-ray negative for signs of obstruction.  Patient with symptom control after Reglan 2, IV fluids, Bentyl, and Dilaudid. She has been able to tolerate fluids by mouth without further emesis. On reevaluation, she states that she is feeling better and that she is ready for discharge. Plan to discharge with prescription for Bentyl as well as Phenergan for nausea. Primary care for advised in return precautions given. Patient discharged in satisfactory condition with no unaddressed concerns.   Filed Vitals:   06/17/15 2245 06/17/15 2315 06/17/15 2330 06/18/15 0000  BP: 122/65 131/76 130/74 132/78  Pulse: 68 71 66 70  Temp:      TempSrc:      Resp:      SpO2: 100% 100% 100% 100%     Antonietta Breach, PA-C 06/18/15 0154  Leonard Schwartz, MD 06/18/15 [phone]

## 2015-06-17 NOTE — ED Notes (Addendum)
Pt reports having a flare up of gastroparesis and onset of n/v this am. Also reports having chest pains and palpitations today.

## 2015-06-18 LAB — URINE MICROSCOPIC-ADD ON: WBC, UA: NONE SEEN WBC/hpf (ref 0–5)

## 2015-06-18 LAB — URINALYSIS, ROUTINE W REFLEX MICROSCOPIC
Bilirubin Urine: NEGATIVE
Glucose, UA: NEGATIVE mg/dL
Ketones, ur: 15 mg/dL — AB
Leukocytes, UA: NEGATIVE
Nitrite: NEGATIVE
Protein, ur: 30 mg/dL — AB
Specific Gravity, Urine: 1.014 (ref 1.005–1.030)
pH: 7 (ref 5.0–8.0)

## 2015-06-18 MED ORDER — PROMETHAZINE HCL 25 MG PO TABS: 25.0000 mg | ORAL_TABLET | Freq: Four times a day (QID) | ORAL | Status: DC | PRN

## 2015-06-18 MED ORDER — DICYCLOMINE HCL 20 MG PO TABS: 20.0000 mg | ORAL_TABLET | Freq: Two times a day (BID) | ORAL | Status: DC

## 2015-06-18 NOTE — Discharge Instructions (Signed)
Gastroparesis °Gastroparesis, also called delayed gastric emptying, is a condition in which food takes longer than normal to empty from the stomach. The condition is usually long-lasting (chronic). °CAUSES °This condition may be caused by: °· An endocrine disorder, such as hypothyroidism or diabetes. Diabetes is the most common cause of this condition. °· A nervous system disease, such as Parkinson disease or multiple sclerosis. °· Cancer, infection, or surgery of the stomach or vagus nerve. °· A connective tissue disorder, such as scleroderma. °· Certain medicines. °In most cases, the cause is not known. °RISK FACTORS °This condition is more likely to develop in: °· People with certain disorders, including endocrine disorders, eating disorders, amyloidosis, and scleroderma. °· People with certain diseases, including Parkinson disease or multiple sclerosis. °· People with cancer or infection of the stomach or vagus nerve. °· People who have had surgery on the stomach or vagus nerve. °· People who take certain medicines. °· Women. °SYMPTOMS °Symptoms of this condition include: °· An early feeling of fullness when eating. °· Nausea. °· Weight loss. °· Vomiting. °· Heartburn. °· Abdominal bloating. °· Inconsistent blood glucose levels. °· Lack of appetite. °· Acid from the stomach coming up into the esophagus (gastroesophageal reflux). °· Spasms of the stomach. °Symptoms may come and go. °DIAGNOSIS °This condition is diagnosed with tests, such as: °· Tests that check how long it takes food to move through the stomach and intestines. These tests include: °¨ Upper gastrointestinal (GI) series. In this test, X-rays of the intestines are taken after you drink a liquid. The liquid makes the intestines show up better on the X-rays. °¨ Gastric emptying scintigraphy. In this test, scans are taken after you eat food that contains a small amount of radioactive material. °¨ Wireless capsule GI monitoring system. This test  involves swallowing a capsule that records information about movement through the stomach. °· Gastric manometry. This test measures electrical and muscular activity in the stomach. It is done with a thin tube that is passed down the throat and into the stomach. °· Endoscopy. This test checks for abnormalities in the lining of the stomach. It is done with a long, thin tube that is passed down the throat and into the stomach. °· An ultrasound. This test can help rule out gallbladder disease or pancreatitis as a cause of your symptoms. It uses sound waves to take pictures of the inside of your body. °TREATMENT °There is no cure for gastroparesis. This condition may be managed with: °· Treatment of the underlying condition causing the gastroparesis. °· Lifestyle changes, including exercise and dietary changes. Dietary changes can include: °¨ Changes in what and when you eat. °¨ Eating smaller meals more often. °¨ Eating low-fat foods. °¨ Eating low-fiber forms of high-fiber foods, such as cooked vegetables instead of raw vegetables. °¨ Having liquid foods in place of solid foods. Liquid foods are easier to digest. °· Medicines. These may be given to control nausea and vomiting and to stimulate stomach muscles. °· Getting food through a feeding tube. This may be done in severe cases. °· A gastric neurostimulator. This is a device that is inserted into the body with surgery. It helps improve stomach emptying and control nausea and vomiting. °HOME CARE INSTRUCTIONS °· Follow your health care provider's instructions about exercise and diet. °· Take medicines only as directed by your health care provider. °SEEK MEDICAL CARE IF: °· Your symptoms do not improve with treatment. °· You have new symptoms. °SEEK IMMEDIATE MEDICAL CARE IF: °· You have   severe abdominal pain that does not improve with treatment. °· You have nausea that does not go away. °· You cannot keep fluids down. °  °This information is not intended to replace  advice given to you by your health care provider. Make sure you discuss any questions you have with your health care provider. °  °Document Released: 01/19/2005 Document Revised: 06/05/2014 Document Reviewed: 01/15/2014 °Elsevier Interactive Patient Education ©2016 [employer] ° °

## 2015-06-18 NOTE — ED Notes (Signed)
Pt given ginger-ale, reports so far it has "stayed down".

## 2015-07-05 ENCOUNTER — Encounter (HOSPITAL_COMMUNITY)
Admission: RE | Admit: 2015-07-05 | Discharge: 2015-07-05 | Disposition: A | Discharge status: Home / Self Care | Payer: Medicaid Other | Source: Ambulatory Visit | Attending: Nephrology | Admitting: Nephrology

## 2015-07-05 DIAGNOSIS — N185 Chronic kidney disease, stage 5: Secondary | ICD-10-CM | POA: Insufficient documentation

## 2015-07-05 DIAGNOSIS — D638 Anemia in other chronic diseases classified elsewhere: Secondary | ICD-10-CM | POA: Diagnosis present

## 2015-07-05 DIAGNOSIS — D631 Anemia in chronic kidney disease: Secondary | ICD-10-CM | POA: Insufficient documentation

## 2015-07-05 DIAGNOSIS — Z940 Kidney transplant status: Secondary | ICD-10-CM | POA: Diagnosis not present

## 2015-07-05 LAB — POCT HEMOGLOBIN-HEMACUE: Hemoglobin: 10.4 g/dL — ABNORMAL LOW (ref 12.0–15.0)

## 2015-07-05 LAB — RENAL FUNCTION PANEL
Albumin: 3.9 g/dL (ref 3.5–5.0)
Anion gap: 5 (ref 5–15)
BUN: 30 mg/dL — ABNORMAL HIGH (ref 6–20)
CO2: 25 mmol/L (ref 22–32)
Calcium: 9.8 mg/dL (ref 8.9–10.3)
Chloride: 111 mmol/L (ref 101–111)
Creatinine, Ser: 2 mg/dL — ABNORMAL HIGH (ref 0.44–1.00)
GFR calc Af Amer: 33 mL/min — ABNORMAL LOW (ref >60)
GFR calc non Af Amer: 29 mL/min — ABNORMAL LOW (ref >60)
Glucose, Bld: 70 mg/dL (ref 65–99)
Phosphorus: 3.8 mg/dL (ref 2.5–4.6)
Potassium: 4.7 mmol/L (ref 3.5–5.1)
Sodium: 141 mmol/L (ref 135–145)

## 2015-07-05 LAB — IRON AND TIBC
Iron: 79 ug/dL (ref 28–170)
Saturation Ratios: 37 % — ABNORMAL HIGH (ref 10.4–31.8)
TIBC: 211 ug/dL — ABNORMAL LOW (ref 250–450)
UIBC: 132 ug/dL

## 2015-07-05 LAB — FERRITIN: Ferritin: 1002 ng/mL — ABNORMAL HIGH (ref 11–307)

## 2015-07-05 MED ORDER — EPOETIN ALFA 40000 UNIT/ML IJ SOLN
INTRAMUSCULAR | Status: AC
  Filled 2015-07-05: qty 1

## 2015-07-05 MED ORDER — EPOETIN ALFA 20000 UNIT/ML IJ SOLN
40000.0000 [IU] | INTRAMUSCULAR | Status: DC
  Administered 2015-07-05: 40000 [IU] via SUBCUTANEOUS

## 2015-08-09 ENCOUNTER — Encounter (HOSPITAL_COMMUNITY)
Admission: RE | Admit: 2015-08-09 | Discharge: 2015-08-09 | Disposition: A | Discharge status: Home / Self Care | Payer: Medicaid Other | Source: Ambulatory Visit | Attending: Nephrology | Admitting: Nephrology

## 2015-08-09 DIAGNOSIS — N185 Chronic kidney disease, stage 5: Secondary | ICD-10-CM | POA: Diagnosis not present

## 2015-08-09 DIAGNOSIS — Z940 Kidney transplant status: Secondary | ICD-10-CM | POA: Insufficient documentation

## 2015-08-09 DIAGNOSIS — D638 Anemia in other chronic diseases classified elsewhere: Secondary | ICD-10-CM | POA: Diagnosis present

## 2015-08-09 DIAGNOSIS — D631 Anemia in chronic kidney disease: Secondary | ICD-10-CM | POA: Insufficient documentation

## 2015-08-09 LAB — POCT HEMOGLOBIN-HEMACUE: Hemoglobin: 9.9 g/dL — ABNORMAL LOW (ref 12.0–15.0)

## 2015-08-09 LAB — RENAL FUNCTION PANEL
Albumin: 4 g/dL (ref 3.5–5.0)
Anion gap: 7 (ref 5–15)
BUN: 55 mg/dL — ABNORMAL HIGH (ref 6–20)
CO2: 21 mmol/L — ABNORMAL LOW (ref 22–32)
Calcium: 9.7 mg/dL (ref 8.9–10.3)
Chloride: 106 mmol/L (ref 101–111)
Creatinine, Ser: 3.72 mg/dL — ABNORMAL HIGH (ref 0.44–1.00)
GFR calc Af Amer: 16 mL/min — ABNORMAL LOW (ref >60)
GFR calc non Af Amer: 13 mL/min — ABNORMAL LOW (ref >60)
Glucose, Bld: 82 mg/dL (ref 65–99)
Phosphorus: 4.9 mg/dL — ABNORMAL HIGH (ref 2.5–4.6)
Potassium: 5 mmol/L (ref 3.5–5.1)
Sodium: 134 mmol/L — ABNORMAL LOW (ref 135–145)

## 2015-08-09 MED ORDER — EPOETIN ALFA 20000 UNIT/ML IJ SOLN: 40000.0000 [IU] | INTRAMUSCULAR | Status: DC

## 2015-08-09 MED ORDER — EPOETIN ALFA 40000 UNIT/ML IJ SOLN
INTRAMUSCULAR | Status: AC
  Administered 2015-08-09: 40000 [IU] via SUBCUTANEOUS
  Filled 2015-08-09: qty 1

## 2015-08-15 ENCOUNTER — Other Ambulatory Visit: Payer: Self-pay | Admitting: Nephrology

## 2015-08-15 DIAGNOSIS — Z1231 Encounter for screening mammogram for malignant neoplasm of breast: Secondary | ICD-10-CM

## 2015-08-29 ENCOUNTER — Other Ambulatory Visit: Payer: Self-pay | Admitting: Nephrology

## 2015-09-02 ENCOUNTER — Encounter (HOSPITAL_COMMUNITY)
Admission: RE | Admit: 2015-09-02 | Discharge: 2015-09-02 | Disposition: A | Discharge status: Home / Self Care | Payer: Medicaid Other | Source: Ambulatory Visit | Attending: Nephrology | Admitting: Nephrology

## 2015-09-02 ENCOUNTER — Ambulatory Visit
Admission: RE | Admit: 2015-09-02 | Discharge: 2015-09-02 | Disposition: A | Discharge status: Home / Self Care | Payer: Medicaid Other | Source: Ambulatory Visit | Attending: Nephrology | Admitting: Nephrology

## 2015-09-02 DIAGNOSIS — Z940 Kidney transplant status: Secondary | ICD-10-CM

## 2015-09-02 DIAGNOSIS — D631 Anemia in chronic kidney disease: Secondary | ICD-10-CM | POA: Diagnosis not present

## 2015-09-02 DIAGNOSIS — Z1231 Encounter for screening mammogram for malignant neoplasm of breast: Secondary | ICD-10-CM

## 2015-09-02 LAB — RENAL FUNCTION PANEL
Albumin: 4.1 g/dL (ref 3.5–5.0)
Anion gap: 6 (ref 5–15)
BUN: 40 mg/dL — ABNORMAL HIGH (ref 6–20)
CO2: 25 mmol/L (ref 22–32)
Calcium: 9.9 mg/dL (ref 8.9–10.3)
Chloride: 109 mmol/L (ref 101–111)
Creatinine, Ser: 2.66 mg/dL — ABNORMAL HIGH (ref 0.44–1.00)
GFR calc Af Amer: 23 mL/min — ABNORMAL LOW (ref >60)
GFR calc non Af Amer: 20 mL/min — ABNORMAL LOW (ref >60)
Glucose, Bld: 80 mg/dL (ref 65–99)
Phosphorus: 3.6 mg/dL (ref 2.5–4.6)
Potassium: 4.4 mmol/L (ref 3.5–5.1)
Sodium: 140 mmol/L (ref 135–145)

## 2015-09-02 LAB — POCT HEMOGLOBIN-HEMACUE: Hemoglobin: 10.4 g/dL — ABNORMAL LOW (ref 12.0–15.0)

## 2015-09-02 MED ORDER — EPOETIN ALFA 20000 UNIT/ML IJ SOLN
40000.0000 [IU] | INTRAMUSCULAR | Status: DC
Start: 2015-09-02 — End: 2015-09-03

## 2015-09-02 MED ORDER — EPOETIN ALFA 40000 UNIT/ML IJ SOLN
INTRAMUSCULAR | Status: AC
  Administered 2015-09-02: 40000 [IU] via SUBCUTANEOUS
  Filled 2015-09-02: qty 1

## 2015-09-13 ENCOUNTER — Encounter (HOSPITAL_COMMUNITY): Payer: Medicaid Other

## 2015-09-25 ENCOUNTER — Emergency Department (HOSPITAL_COMMUNITY): Payer: Medicaid Other

## 2015-09-25 ENCOUNTER — Encounter (HOSPITAL_COMMUNITY): Payer: Self-pay | Admitting: *Deleted

## 2015-09-25 ENCOUNTER — Emergency Department (HOSPITAL_COMMUNITY)
Admission: EM | Admit: 2015-09-25 | Discharge: 2015-09-26 | Disposition: A | Discharge status: Home / Self Care | Payer: Medicaid Other | Attending: Emergency Medicine | Admitting: Emergency Medicine

## 2015-09-25 DIAGNOSIS — Z7982 Long term (current) use of aspirin: Secondary | ICD-10-CM | POA: Insufficient documentation

## 2015-09-25 DIAGNOSIS — Z940 Kidney transplant status: Secondary | ICD-10-CM | POA: Diagnosis not present

## 2015-09-25 DIAGNOSIS — F17210 Nicotine dependence, cigarettes, uncomplicated: Secondary | ICD-10-CM | POA: Insufficient documentation

## 2015-09-25 DIAGNOSIS — R0789 Other chest pain: Secondary | ICD-10-CM | POA: Insufficient documentation

## 2015-09-25 DIAGNOSIS — R509 Fever, unspecified: Secondary | ICD-10-CM

## 2015-09-25 DIAGNOSIS — R079 Chest pain, unspecified: Secondary | ICD-10-CM | POA: Diagnosis present

## 2015-09-25 DIAGNOSIS — I120 Hypertensive chronic kidney disease with stage 5 chronic kidney disease or end stage renal disease: Secondary | ICD-10-CM | POA: Insufficient documentation

## 2015-09-25 DIAGNOSIS — R0781 Pleurodynia: Secondary | ICD-10-CM

## 2015-09-25 DIAGNOSIS — N186 End stage renal disease: Secondary | ICD-10-CM | POA: Insufficient documentation

## 2015-09-25 LAB — BASIC METABOLIC PANEL
Anion gap: 8 (ref 5–15)
BUN: 30 mg/dL — ABNORMAL HIGH (ref 6–20)
CO2: 25 mmol/L (ref 22–32)
Calcium: 9.7 mg/dL (ref 8.9–10.3)
Chloride: 104 mmol/L (ref 101–111)
Creatinine, Ser: 2.56 mg/dL — ABNORMAL HIGH (ref 0.44–1.00)
GFR calc Af Amer: 24 mL/min — ABNORMAL LOW (ref >60)
GFR calc non Af Amer: 21 mL/min — ABNORMAL LOW (ref >60)
Glucose, Bld: 99 mg/dL (ref 65–99)
Potassium: 4.3 mmol/L (ref 3.5–5.1)
Sodium: 137 mmol/L (ref 135–145)

## 2015-09-25 LAB — CBC
HCT: 29.4 % — ABNORMAL LOW (ref 36.0–46.0)
Hemoglobin: 9.3 g/dL — ABNORMAL LOW (ref 12.0–15.0)
MCH: 30.6 pg (ref 26.0–34.0)
MCHC: 31.6 g/dL (ref 30.0–36.0)
MCV: 96.7 fL (ref 78.0–100.0)
Platelets: 162 10*3/uL (ref 150–400)
RBC: 3.04 MIL/uL — ABNORMAL LOW (ref 3.87–5.11)
RDW: 20.4 % — ABNORMAL HIGH (ref 11.5–15.5)
WBC: 4.7 10*3/uL (ref 4.0–10.5)

## 2015-09-25 LAB — I-STAT TROPONIN, ED: Troponin i, poc: 0.01 ng/mL (ref 0.00–0.08)

## 2015-09-25 LAB — I-STAT CG4 LACTIC ACID, ED
Lactic Acid, Venous: 0.82 mmol/L (ref 0.5–1.9)
Lactic Acid, Venous: 1.94 mmol/L (ref 0.5–1.9)

## 2015-09-25 MED ORDER — ONDANSETRON HCL 4 MG/2ML IJ SOLN
4.0000 mg | Freq: Once | INTRAMUSCULAR | Status: DC
  Filled 2015-09-25: qty 2

## 2015-09-25 MED ORDER — SODIUM CHLORIDE 0.9 % IV BOLUS (SEPSIS)
500.0000 mL | Freq: Once | INTRAVENOUS | Status: AC
  Administered 2015-09-26: 500 mL via INTRAVENOUS

## 2015-09-25 MED ORDER — HYDROMORPHONE HCL 1 MG/ML IJ SOLN
1.0000 mg | Freq: Once | INTRAMUSCULAR | Status: AC
  Administered 2015-09-25: 1 mg via INTRAVENOUS
  Filled 2015-09-25: qty 1

## 2015-09-25 NOTE — ED Triage Notes (Signed)
Pt c/o L sided sharp stabbing chest pain this morning, worsening throughout the day. Pain worsens on inspiration and fever at 102. Pt took a Vicodin pta

## 2015-09-25 NOTE — ED Notes (Signed)
IV start attempted without success.

## 2015-09-25 NOTE — ED Notes (Signed)
[place], MD at bedside 

## 2015-09-25 NOTE — ED Provider Notes (Signed)
[facility] DEPT Provider Note   CSN: [id number] Arrival date & time: 09/25/15  [id number]  By signing my name below, I, Tina Watkins, attest that this documentation has been prepared under the direction and in the presence of Orpah Greek, MD . Electronically Signed: Higinio Watkins, Scribe. 09/25/2015. 11:24 PM.  History   Chief Complaint Chief Complaint  Patient presents with  . Chest Pain   The history is provided by the patient. No language interpreter was used.   HPI Comments: Tina Watkins is a 50 y.o. female with PMHx of dysrythmia and stroke, who presents to the Emergency Department complaining of gradually worsening, left sided chest pain and shortness of breath that began this morning and worsened PTA. Pt reports her pain is exacerbated when taking a deep breath, lifting objects and certain movements. She also states associated fever (TMAX 102.6); she notes this is the first time she has experienced similar symptoms. Pt reports she visited the ED tonight because she is catching a flight at 6:30 AM and wanted "to make sure everything was okay." She denies vomiting, diarrhea and cough. She reports PSHx of spinal fusion in 01/08/15 and right kidney transplant in 2005; she states she is not currently on dialysis.   Past Medical History:  Diagnosis Date  . Bacteremia due to Gram-negative bacteria ([place]) 05/23/2011  . Blind right eye   . Chronic lower back pain   . Complication of anesthesia    "woke up during OR; I have an extremely high tolerance" (12/11/2011) 1 procedure was graft; the other procedures were procedures that are typically done with sedation.  . DDD (degenerative disc disease), cervical   . Depression   . Dysrhythmia    "tachycardia" (12/11/2011) new onset afib 10/15/14 EKG  . E coli bacteremia 06/18/2011  . ESRD (end stage renal disease) ([place]) 06/12/2011  . Fibromyalgia   . Gastroparesis   . Gastroparesis   . GERD (gastroesophageal reflux disease)   . Glaucoma    right eye  . Gout   . H/O carpal tunnel syndrome   . Headache(784.0)    "not often anymore" (12/11/2011)  . Herpes genitalia 1994  . History of blood transfusion    "more than a few times" (12/11/2011)  . History of stomach ulcers   . Hypotension   . Iron deficiency anemia   . New onset a-fib ([place])    10/15/14 EKG  . Osteopenia   . Seizures ([place]) 1994   "post transplant; only have had that one" (12/11/2011)  . Spinal stenosis in cervical region   . Stroke [facility])     left basal ganglia lacunar infarct; Right frontal lobe lacunar infarct.  . Stroke [facility]) ~ 1999; 2001   "briefly lost my vision; lost my right eye" (12/11/2011)    Patient Active Problem List   Diagnosis Date Noted  . Cervical stenosis of spinal canal 01/08/2015  . Urinary tract infectious disease   . Muscle spasms of neck 06/15/2014  . Bleeding hemorrhoid 06/15/2014  . Anemia in chronic kidney disease 06/15/2014  . Acute on chronic renal failure ([place]) 06/13/2014  . Pyelonephritis, acute 06/13/2014  . Sepsis ([place]) 06/13/2014  . History of renal transplant   . Chronic pain disorder 04/09/2012  . Dehydration, mild 04/09/2012  . Gout attack 06/23/2011  . Herpes infection 06/23/2011  . Anxiety 06/23/2011  . E coli bacteremia 06/18/2011  . ESRD (end stage renal disease) ([place]) 06/12/2011  . UTI (lower urinary tract infection) 06/12/2011  . Bacteremia due  to Gram-negative bacteria ([place]) 05/23/2011  . History of renal transplantation 05/22/2011  . Septic shock(785.52) 05/22/2011  . Acute on chronic kidney failure ([place]) 05/22/2011  . Gastroparesis 04/24/2008  . WEIGHT LOSS 08/24/2007  . NAUSEA AND VOMITING 08/24/2007    Past Surgical History:  Procedure Laterality Date  . ANTERIOR CERVICAL DECOMP/DISCECTOMY FUSION N/A 01/08/2015   Procedure: Anterior Cervical Three-Four/Four-Five Decompression/Diskectomy/Fusion;  Surgeon: Leeroy Cha, MD;  Location: [facility];  Service: Neurosurgery;  Laterality: N/A;  C3-4 C4-5  Anterior cervical decompression/diskectomy/fusion  . APPENDECTOMY  ~ 2004  . CATARACT EXTRACTION     right eye  . COLONOSCOPY    . ENUCLEATION  2001   "right"  . ESOPHAGOGASTRODUODENOSCOPY (EGD) WITH PROPOFOL N/A 04/21/2012   Procedure: ESOPHAGOGASTRODUODENOSCOPY (EGD) WITH PROPOFOL;  Surgeon: Milus Banister, MD;  Location: [facility];  Service: Endoscopy;  Laterality: N/A;  . 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 [place]; 1999; 2005   "right"  . MULTIPLE TOOTH EXTRACTIONS    . TONSILLECTOMY    . [place]?; 1994; 2005    OB History    No data available       Home Medications    Prior to Admission medications   Medication Sig Start Date End Date Taking? Authorizing Provider  ALPRAZolam (XANAX) 0.25 MG tablet Take 0.25 mg by mouth daily as needed (FOR SVT).    Historical Provider, MD  aspirin EC 81 MG tablet Take 81 mg by mouth daily.    Historical Provider, MD  azaTHIOprine (IMURAN) 50 MG tablet Take 100 mg by mouth daily.     Historical Provider, MD  colchicine 0.6 MG tablet Take 0.6 mg by mouth daily as needed (For gout.).    Historical Provider, MD  dicyclomine (BENTYL) 20 MG tablet Take 1 tablet (20 mg total) by mouth 2 (two) times daily. 06/18/15   Antonietta Breach, PA-C  eszopiclone (LUNESTA) 1 MG TABS tablet Take 1 mg by mouth at bedtime as needed for sleep. Take immediately before bedtime    Historical Provider, MD  furosemide (LASIX) 40 MG tablet Take 40 mg by mouth daily.  06/22/11   [name], MD  metoCLOPramide (REGLAN) 5 MG tablet TAKE 1 TABLET (5 MG TOTAL) BY MOUTH AT BEDTIME AND MAY REPEAT DOSE ONE TIME IF NEEDED. Patient not taking: Reported on 06/17/2015 08/27/14   Milus Banister, MD  omeprazole (PRILOSEC) 40 MG capsule Take 40 mg by mouth 2 (two) times daily.     Historical Provider, MD  oxyCODONE-acetaminophen (PERCOCET) 10-325 MG tablet Take 1 tablet by mouth  every 6 (six) hours as needed for pain.    Historical Provider, MD  predniSONE (DELTASONE) 5 MG tablet Take 5 mg by mouth daily. 06/22/11   [name], MD  Prenatal Vit-Fe Fumarate-FA (PRENATAL VITAMIN PO) Take 1 tablet by mouth daily.     Historical Provider, MD  promethazine (PHENERGAN) 25 MG tablet Take 1 tablet (25 mg total) by mouth every 6 (six) hours as needed for nausea or vomiting. 06/18/15   Antonietta Breach, PA-C  tacrolimus (PROGRAF) 1 MG capsule Take 3 mg by mouth 2 (two) times daily.  06/22/11   [name], MD  zolpidem (AMBIEN) 10 MG tablet Take 10 mg by mouth at bedtime as needed for sleep.    Historical Provider, MD    Family History Family History  Problem Relation Age of Onset  . Glaucoma Mother   . Multiple sclerosis Brother   . Hypertension Maternal Grandmother     Social History Social History  Substance Use Topics  . Smoking status: Current Every Day Smoker    Packs/day: 0.50    Years: 28.00    Types: Cigarettes  . Smokeless tobacco: Never Used     Comment: had quit 03/05/2011  . Alcohol use Yes     Comment: rare   Allergies   Levofloxacin  Review of Systems Review of Systems  Constitutional: Positive for fever.  Respiratory: Positive for shortness of breath. Negative for cough.   Cardiovascular: Positive for chest pain.  Gastrointestinal: Negative for diarrhea and vomiting.   Physical Exam Updated Vital Signs BP (!) 89/59 (BP Location: Right Arm)   Pulse 75   Temp 100 F (37.8 C)   Resp 17   Ht 4\' 11"  (1.499 m)   Wt 112 lb 9.6 oz (51.1 kg)   SpO2 100%   BMI 22.74 kg/m   Physical Exam  Constitutional: She is oriented to person, place, and time. She appears well-developed and well-nourished. No distress.  HENT:  Head: Normocephalic and atraumatic.  Right Ear: Hearing normal.  Left Ear: Hearing normal.  Nose: Nose normal.  Mouth/Throat: Oropharynx is clear and moist and mucous membranes are normal.  Eyes: Conjunctivae and EOM  are normal. Pupils are equal, round, and reactive to light.  Neck: Normal range of motion. Neck supple.  Cardiovascular: S1 normal and S2 normal.  Exam reveals no gallop and no friction rub.   No murmur heard. Tachypnic   Pulmonary/Chest: Effort normal and breath sounds normal. No respiratory distress. She exhibits tenderness.  Left chest wall tenderness  Abdominal: Soft. Normal appearance and bowel sounds are normal. There is no hepatosplenomegaly. There is no tenderness. There is no rebound, no guarding, no tenderness at McBurney's point and negative Murphy's sign. No hernia.  Musculoskeletal: Normal range of motion.  Neurological: She is alert and oriented to person, place, and time. She has normal strength. No cranial nerve deficit or sensory deficit. Coordination normal. GCS eye subscore is 4. GCS verbal subscore is 5. GCS motor subscore is 6.  Skin: Skin is warm, dry and intact. No rash noted. No cyanosis.  Psychiatric: She has a normal mood and affect. Her speech is normal and behavior is normal. Thought content normal.  Nursing note and vitals reviewed.  ED Treatments / Results  Labs (all labs ordered are listed, but only abnormal results are displayed) Labs Reviewed  BASIC METABOLIC PANEL - Abnormal; Notable for the following:       Result Value   BUN 30 (*)    Creatinine, Ser 2.56 (*)    GFR calc non Af Amer 21 (*)    GFR calc Af Amer 24 (*)    All other components within normal limits  CBC - Abnormal; Notable for the following:    RBC 3.04 (*)    Hemoglobin 9.3 (*)    HCT 29.4 (*)    RDW 20.4 (*)    All other components within normal limits  D-DIMER, QUANTITATIVE (NOT AT [facility]) - Abnormal; Notable for the following:    D-Dimer, Quant 2.21 (*)    All other components within normal limits  I-STAT CG4 LACTIC ACID, ED - Abnormal; Notable for the following:    Lactic Acid, Venous 1.94 (*)    All other components within normal limits  I-STAT TROPOININ, ED  I-STAT CG4  LACTIC  ACID, ED    EKG  EKG Interpretation  Date/Time:  Wednesday September 25 2015 22:52:51 EDT Ventricular Rate:  77 PR Interval:    QRS Duration: 71 QT Interval:  419 QTC Calculation: 475 R Axis:   49 Text Interpretation:  Sinus rhythm Abnormal R-wave progression, early transition No significant change since last tracing Confirmed by Eldra Word  MD, Zari Cly [phone]) on 09/25/2015 11:04:15 PM      Radiology Dg Chest 2 View  Result Date: 09/25/2015 CLINICAL DATA:  LEFT chest pain and dyspnea beginning this morning. Tachycardia. History of stent. EXAM: CHEST  2 VIEW COMPARISON:  Chest radiograph Jun 17, 2015 FINDINGS: Cardiac silhouette is mildly enlarged and unchanged. Stent projects in RIGHT mediastinum. Mild similar bronchitic changes in pulmonary vascular congestion without pleural effusion or focal consolidation. Mildly elevated RIGHT hemidiaphragm. No pneumothorax. ACDF. Surgical clips in the abdomen. IMPRESSION: Mild cardiomegaly. Similar bronchitic changes and mild pulmonary vascular congestion. Electronically Signed   By: Elon Alas M.D.   On: 09/25/2015 20:56   Procedures Procedures  DIAGNOSTIC STUDIES:  Oxygen Saturation is 100% on RA, normal by my interpretation.    COORDINATION OF CARE:  11:12 PM Discussed treatment Watkins with pt at bedside and pt agreed to Watkins.  Medications Ordered in ED Medications - No data to display  Initial Impression / Assessment and Watkins / ED Course  I have reviewed the triage vital signs and the nursing notes.  Pertinent labs & imaging results that were available during my care of the patient were reviewed by me and considered in my medical decision making (see chart for details).  Clinical Course   Patient presented to the ER for evaluation of chest pain. Patient experiencing shortness of breath associated with the pain. Patient indicates that she is appearance and is sharp pain that worsens with taking a deep breath. There was some  musculoskeletal component, as she did report pain with movement and there was tenderness to the chest. This was felt to be inconsistent with cardiac etiology, EKG and troponin negative. Patient did have an elevated d-dimer and therefore underwent VQ scan. This was a low probability study.  Patient reports that she has had a fever at home. This is of concern because she is on immunosuppressive therapy secondary to renal transplant. I did recommend admission with the patient has declined because she has a trip planned later today. She understands that she can become septic, have severe illness and even death or permanent disability but declined admission. Patient therefore administered Rocephin will be prescribed Omnicef empirically.  I personally performed the services described in this documentation, which was scribed in my presence. The recorded information has been reviewed and is accurate.   Final Clinical Impressions(s) / ED Diagnoses   Final diagnoses:  None  chest wall pain fever  New Prescriptions New Prescriptions   No medications on file     Orpah Greek, MD 09/26/15 [phone]

## 2015-09-25 NOTE — ED Notes (Signed)
IV nurse at bedside.

## 2015-09-26 ENCOUNTER — Emergency Department (HOSPITAL_COMMUNITY): Payer: Medicaid Other

## 2015-09-26 LAB — D-DIMER, QUANTITATIVE: D-Dimer, Quant: 2.21 ug/mL-FEU — ABNORMAL HIGH (ref 0.00–0.50)

## 2015-09-26 MED ORDER — OXYCODONE-ACETAMINOPHEN 5-325 MG PO TABS
2.0000 | ORAL_TABLET | Freq: Once | ORAL | Status: AC
  Administered 2015-09-26: 2 via ORAL
  Filled 2015-09-26: qty 2

## 2015-09-26 MED ORDER — CEFDINIR 300 MG PO CAPS: 300.0000 mg | ORAL_CAPSULE | Freq: Two times a day (BID) | ORAL | 0 refills | Status: DC

## 2015-09-26 MED ORDER — DEXTROSE 5 % IV SOLN
1.0000 g | Freq: Once | INTRAVENOUS | Status: AC
  Administered 2015-09-26: 1 g via INTRAVENOUS
  Filled 2015-09-26: qty 10

## 2015-09-26 MED ORDER — TECHNETIUM TC 99M DIETHYLENETRIAME-PENTAACETIC ACID: 31.2000 | Freq: Once | INTRAVENOUS | Status: DC | PRN

## 2015-09-26 MED ORDER — HYDROMORPHONE HCL 1 MG/ML IJ SOLN
1.0000 mg | Freq: Once | INTRAMUSCULAR | Status: AC
  Administered 2015-09-26: 1 mg via INTRAVENOUS
  Filled 2015-09-26: qty 1

## 2015-09-26 MED ORDER — TECHNETIUM TO 99M ALBUMIN AGGREGATED
4.1000 | Freq: Once | INTRAVENOUS | Status: AC | PRN
  Administered 2015-09-26: 4 via INTRAVENOUS

## 2015-09-26 MED ORDER — SODIUM CHLORIDE 0.9 % IV SOLN
Freq: Once | INTRAVENOUS | Status: AC
  Administered 2015-09-26: 03:00:00 via INTRAVENOUS

## 2015-09-26 NOTE — ED Notes (Signed)
Pt departed in NAD, escorted in a wheelchair by Mickel Baas, [place].

## 2015-09-26 NOTE — ED Notes (Signed)
Pt returned from [place] 

## 2015-09-29 ENCOUNTER — Emergency Department (HOSPITAL_COMMUNITY): Payer: Medicaid Other

## 2015-09-29 ENCOUNTER — Encounter (HOSPITAL_COMMUNITY): Payer: Self-pay

## 2015-09-29 ENCOUNTER — Inpatient Hospital Stay (HOSPITAL_COMMUNITY)
Admission: EM | Admit: 2015-09-29 | Discharge: 2015-10-03 | DRG: 193 | Disposition: A | Discharge status: Home / Self Care | Payer: Medicaid Other | Attending: Internal Medicine | Admitting: Internal Medicine

## 2015-09-29 DIAGNOSIS — M797 Fibromyalgia: Secondary | ICD-10-CM | POA: Diagnosis present

## 2015-09-29 DIAGNOSIS — K3184 Gastroparesis: Secondary | ICD-10-CM | POA: Diagnosis present

## 2015-09-29 DIAGNOSIS — G894 Chronic pain syndrome: Secondary | ICD-10-CM | POA: Diagnosis present

## 2015-09-29 DIAGNOSIS — D638 Anemia in other chronic diseases classified elsewhere: Secondary | ICD-10-CM | POA: Diagnosis present

## 2015-09-29 DIAGNOSIS — Z9841 Cataract extraction status, right eye: Secondary | ICD-10-CM

## 2015-09-29 DIAGNOSIS — Z881 Allergy status to other antibiotic agents status: Secondary | ICD-10-CM

## 2015-09-29 DIAGNOSIS — R079 Chest pain, unspecified: Secondary | ICD-10-CM | POA: Diagnosis present

## 2015-09-29 DIAGNOSIS — Z981 Arthrodesis status: Secondary | ICD-10-CM

## 2015-09-29 DIAGNOSIS — I959 Hypotension, unspecified: Secondary | ICD-10-CM | POA: Diagnosis present

## 2015-09-29 DIAGNOSIS — J189 Pneumonia, unspecified organism: Principal | ICD-10-CM | POA: Diagnosis present

## 2015-09-29 DIAGNOSIS — F17210 Nicotine dependence, cigarettes, uncomplicated: Secondary | ICD-10-CM | POA: Diagnosis present

## 2015-09-29 DIAGNOSIS — R609 Edema, unspecified: Secondary | ICD-10-CM

## 2015-09-29 DIAGNOSIS — H5441 Blindness, right eye, normal vision left eye: Secondary | ICD-10-CM | POA: Diagnosis present

## 2015-09-29 DIAGNOSIS — I480 Paroxysmal atrial fibrillation: Secondary | ICD-10-CM | POA: Diagnosis present

## 2015-09-29 DIAGNOSIS — N186 End stage renal disease: Secondary | ICD-10-CM | POA: Diagnosis present

## 2015-09-29 DIAGNOSIS — F419 Anxiety disorder, unspecified: Secondary | ICD-10-CM | POA: Diagnosis present

## 2015-09-29 DIAGNOSIS — I120 Hypertensive chronic kidney disease with stage 5 chronic kidney disease or end stage renal disease: Secondary | ICD-10-CM | POA: Diagnosis present

## 2015-09-29 DIAGNOSIS — Z7952 Long term (current) use of systemic steroids: Secondary | ICD-10-CM

## 2015-09-29 DIAGNOSIS — Z940 Kidney transplant status: Secondary | ICD-10-CM

## 2015-09-29 DIAGNOSIS — E1122 Type 2 diabetes mellitus with diabetic chronic kidney disease: Secondary | ICD-10-CM | POA: Diagnosis present

## 2015-09-29 DIAGNOSIS — M109 Gout, unspecified: Secondary | ICD-10-CM | POA: Diagnosis present

## 2015-09-29 DIAGNOSIS — T8619 Other complication of kidney transplant: Secondary | ICD-10-CM | POA: Diagnosis present

## 2015-09-29 DIAGNOSIS — E871 Hypo-osmolality and hyponatremia: Secondary | ICD-10-CM | POA: Diagnosis not present

## 2015-09-29 DIAGNOSIS — D631 Anemia in chronic kidney disease: Secondary | ICD-10-CM | POA: Diagnosis present

## 2015-09-29 DIAGNOSIS — Z8673 Personal history of transient ischemic attack (TIA), and cerebral infarction without residual deficits: Secondary | ICD-10-CM

## 2015-09-29 DIAGNOSIS — N179 Acute kidney failure, unspecified: Secondary | ICD-10-CM | POA: Diagnosis present

## 2015-09-29 DIAGNOSIS — Z7982 Long term (current) use of aspirin: Secondary | ICD-10-CM

## 2015-09-29 DIAGNOSIS — Z79899 Other long term (current) drug therapy: Secondary | ICD-10-CM

## 2015-09-29 DIAGNOSIS — G8929 Other chronic pain: Secondary | ICD-10-CM | POA: Diagnosis present

## 2015-09-29 DIAGNOSIS — E1143 Type 2 diabetes mellitus with diabetic autonomic (poly)neuropathy: Secondary | ICD-10-CM | POA: Diagnosis present

## 2015-09-29 DIAGNOSIS — N189 Chronic kidney disease, unspecified: Secondary | ICD-10-CM

## 2015-09-29 DIAGNOSIS — K5900 Constipation, unspecified: Secondary | ICD-10-CM | POA: Diagnosis present

## 2015-09-29 DIAGNOSIS — K219 Gastro-esophageal reflux disease without esophagitis: Secondary | ICD-10-CM | POA: Diagnosis present

## 2015-09-29 DIAGNOSIS — E785 Hyperlipidemia, unspecified: Secondary | ICD-10-CM | POA: Diagnosis present

## 2015-09-29 DIAGNOSIS — J90 Pleural effusion, not elsewhere classified: Secondary | ICD-10-CM

## 2015-09-29 LAB — CBC
HCT: 24 % — ABNORMAL LOW (ref 36.0–46.0)
Hemoglobin: 7.8 g/dL — ABNORMAL LOW (ref 12.0–15.0)
MCH: 30.8 pg (ref 26.0–34.0)
MCHC: 32.5 g/dL (ref 30.0–36.0)
MCV: 94.9 fL (ref 78.0–100.0)
Platelets: 206 10*3/uL (ref 150–400)
RBC: 2.53 MIL/uL — ABNORMAL LOW (ref 3.87–5.11)
RDW: 20.7 % — ABNORMAL HIGH (ref 11.5–15.5)
WBC: 5.6 10*3/uL (ref 4.0–10.5)

## 2015-09-29 LAB — URINE MICROSCOPIC-ADD ON

## 2015-09-29 LAB — URINALYSIS, ROUTINE W REFLEX MICROSCOPIC
Glucose, UA: NEGATIVE mg/dL
Hgb urine dipstick: NEGATIVE
Ketones, ur: 15 mg/dL — AB
Nitrite: NEGATIVE
Protein, ur: 30 mg/dL — AB
Specific Gravity, Urine: 1.022 (ref 1.005–1.030)
pH: 5 (ref 5.0–8.0)

## 2015-09-29 LAB — BASIC METABOLIC PANEL
Anion gap: 9 (ref 5–15)
BUN: 46 mg/dL — ABNORMAL HIGH (ref 6–20)
CO2: 21 mmol/L — ABNORMAL LOW (ref 22–32)
Calcium: 9 mg/dL (ref 8.9–10.3)
Chloride: 96 mmol/L — ABNORMAL LOW (ref 101–111)
Creatinine, Ser: 3.41 mg/dL — ABNORMAL HIGH (ref 0.44–1.00)
GFR calc Af Amer: 17 mL/min — ABNORMAL LOW (ref >60)
GFR calc non Af Amer: 15 mL/min — ABNORMAL LOW (ref >60)
Glucose, Bld: 148 mg/dL — ABNORMAL HIGH (ref 65–99)
Potassium: 4.2 mmol/L (ref 3.5–5.1)
Sodium: 126 mmol/L — ABNORMAL LOW (ref 135–145)

## 2015-09-29 LAB — I-STAT TROPONIN, ED: Troponin i, poc: 0 ng/mL (ref 0.00–0.08)

## 2015-09-29 MED ORDER — AZATHIOPRINE 50 MG PO TABS
100.0000 mg | ORAL_TABLET | Freq: Every day | ORAL | Status: DC
  Administered 2015-09-30 – 2015-10-03 (×4): 100 mg via ORAL
  Filled 2015-09-29 (×5): qty 2

## 2015-09-29 MED ORDER — ALPRAZOLAM 0.25 MG PO TABS
0.2500 mg | ORAL_TABLET | Freq: Every day | ORAL | Status: DC | PRN
  Administered 2015-10-02: 0.25 mg via ORAL
  Filled 2015-09-29: qty 1

## 2015-09-29 MED ORDER — TACROLIMUS 1 MG PO CAPS
3.0000 mg | ORAL_CAPSULE | Freq: Two times a day (BID) | ORAL | Status: DC
  Administered 2015-09-29 – 2015-10-03 (×8): 3 mg via ORAL
  Filled 2015-09-29 (×8): qty 3

## 2015-09-29 MED ORDER — PANTOPRAZOLE SODIUM 40 MG PO TBEC: 40.0000 mg | DELAYED_RELEASE_TABLET | Freq: Every day | ORAL | Status: DC

## 2015-09-29 MED ORDER — HYDROMORPHONE HCL 1 MG/ML IJ SOLN
1.0000 mg | Freq: Once | INTRAMUSCULAR | Status: AC
  Administered 2015-09-29: 1 mg via INTRAVENOUS
  Filled 2015-09-29: qty 1

## 2015-09-29 MED ORDER — OXYCODONE-ACETAMINOPHEN 5-325 MG PO TABS
1.0000 | ORAL_TABLET | Freq: Four times a day (QID) | ORAL | Status: DC | PRN
  Administered 2015-09-30 – 2015-10-01 (×3): 1 via ORAL
  Filled 2015-09-29 (×3): qty 1

## 2015-09-29 MED ORDER — PREDNISONE 5 MG PO TABS: 5.0000 mg | ORAL_TABLET | Freq: Every day | ORAL | Status: DC

## 2015-09-29 MED ORDER — ENOXAPARIN SODIUM 30 MG/0.3ML ~~LOC~~ SOLN
30.0000 mg | SUBCUTANEOUS | Status: DC
  Administered 2015-09-29: 30 mg via SUBCUTANEOUS
  Filled 2015-09-29: qty 0.3

## 2015-09-29 MED ORDER — ZOLPIDEM TARTRATE 5 MG PO TABS: 5.0000 mg | ORAL_TABLET | Freq: Every evening | ORAL | Status: DC | PRN

## 2015-09-29 MED ORDER — SODIUM CHLORIDE 0.9 % IV BOLUS (SEPSIS)
1000.0000 mL | Freq: Once | INTRAVENOUS | Status: AC
  Administered 2015-09-29: 1000 mL via INTRAVENOUS

## 2015-09-29 MED ORDER — FENTANYL CITRATE (PF) 100 MCG/2ML IJ SOLN
50.0000 ug | Freq: Once | INTRAMUSCULAR | Status: DC
  Filled 2015-09-29: qty 2

## 2015-09-29 MED ORDER — COLCHICINE 0.6 MG PO TABS
0.6000 mg | ORAL_TABLET | Freq: Every day | ORAL | Status: DC | PRN
  Administered 2015-10-03: 0.6 mg via ORAL
  Filled 2015-09-29: qty 1

## 2015-09-29 MED ORDER — PROMETHAZINE HCL 25 MG PO TABS
25.0000 mg | ORAL_TABLET | Freq: Four times a day (QID) | ORAL | Status: DC | PRN
Start: 2015-09-29 — End: 2015-10-03

## 2015-09-29 MED ORDER — METOCLOPRAMIDE HCL 5 MG/ML IJ SOLN
10.0000 mg | Freq: Once | INTRAMUSCULAR | Status: DC
  Filled 2015-09-29: qty 2

## 2015-09-29 MED ORDER — OXYCODONE HCL 5 MG PO TABS
5.0000 mg | ORAL_TABLET | Freq: Four times a day (QID) | ORAL | Status: DC | PRN
  Administered 2015-09-30 – 2015-10-01 (×2): 5 mg via ORAL
  Filled 2015-09-29 (×2): qty 1

## 2015-09-29 MED ORDER — CEFUROXIME AXETIL 500 MG PO TABS
500.0000 mg | ORAL_TABLET | Freq: Every day | ORAL | Status: DC
  Administered 2015-09-30: 500 mg via ORAL
  Filled 2015-09-29: qty 1

## 2015-09-29 MED ORDER — METOCLOPRAMIDE HCL 5 MG PO TABS
5.0000 mg | ORAL_TABLET | Freq: Two times a day (BID) | ORAL | Status: DC
  Administered 2015-09-29 – 2015-10-03 (×8): 5 mg via ORAL
  Filled 2015-09-29 (×8): qty 1

## 2015-09-29 MED ORDER — ONDANSETRON HCL 4 MG/2ML IJ SOLN: 4.0000 mg | Freq: Four times a day (QID) | INTRAMUSCULAR | Status: DC | PRN

## 2015-09-29 MED ORDER — ACETAMINOPHEN 325 MG PO TABS: 650.0000 mg | ORAL_TABLET | Freq: Four times a day (QID) | ORAL | Status: DC | PRN

## 2015-09-29 MED ORDER — OXYCODONE-ACETAMINOPHEN 10-325 MG PO TABS: 1.0000 | ORAL_TABLET | Freq: Four times a day (QID) | ORAL | Status: DC | PRN

## 2015-09-29 MED ORDER — ASPIRIN EC 81 MG PO TBEC
81.0000 mg | DELAYED_RELEASE_TABLET | ORAL | Status: DC
  Administered 2015-09-30 – 2015-10-01 (×2): 81 mg via ORAL
  Filled 2015-09-29 (×3): qty 1

## 2015-09-29 MED ORDER — SODIUM CHLORIDE 0.9% FLUSH
3.0000 mL | Freq: Two times a day (BID) | INTRAVENOUS | Status: DC
  Administered 2015-09-30 – 2015-10-03 (×7): 3 mL via INTRAVENOUS

## 2015-09-29 MED ORDER — MORPHINE SULFATE (PF) 4 MG/ML IV SOLN
4.0000 mg | INTRAVENOUS | Status: AC | PRN
  Administered 2015-09-29 – 2015-09-30 (×6): 4 mg via INTRAVENOUS
  Filled 2015-09-29 (×7): qty 1

## 2015-09-29 NOTE — ED Notes (Signed)
Pt made aware a room is getting ready for her at this time. Pt walking around waiting room refusing to sit down. Pt states she is going outside in the warmth. This rn asked pt to please have a seat and will be taken back to a room as soon as its ready. Pt walked outside.

## 2015-09-29 NOTE — H&P (Signed)
History and Physical    Tina Watkins [id number] DOB: October 12, 1965 DOA: 09/29/2015  PCP: [name], MD   Patient coming from: Home.  Chief Complaint: Chest Pain.  HPI: Tina Watkins is a 50 y.o. female with medical history significant of chronic lower back pain, C-spine spinal stenosis, depression, ESRD, GERD, glaucoma, gastroparesis, osteopenia, seizures, gout, iron deficiency anemia, PAF who returns to the hospital for chest pain since Monday.   Per patient, she woke up on Monday with central chest pain, sharp/tearing in quality radiated around her left sided chest wall to her back, associated with dyspnea, but denies trauma to the  area, dizziness, palpitations, diaphoresis, pitting edema of the lower extremities, PND or orthopnea. She also had an episode of fever, but has not had fever since. She came to the ER on Wednesday evening, had work up including low probability VQ scan and left AMA due to having a planned trip to [place].   The patient just recently flew back into town and returned to the ER straight from the airport. She states that she has continued to have CP and dyspnea while out of town. She has continued taking the oral antibiotics and report she has not had fever since the fist episode earlier in the week.   ED Course: The patient received 1 liter NS bolus, analgesics and antiemetics reporting relief. Work up shows worsening renal function parameters, hyponatremia of 126 and increased in size of left pleural effusion and vascular congestion . Nephrology was contacted who recommended careful hydration, follow up renal function tests and they will evaluate in AM.   Review of Systems: As per HPI otherwise 10 point review of systems negative.    Past Medical History:  Diagnosis Date  . Bacteremia due to Gram-negative bacteria ([place]) 05/23/2011  . Blind right eye   . Chronic lower back pain   . Complication of anesthesia    "woke up during OR; I have an  extremely high tolerance" (12/11/2011) 1 procedure was graft; the other procedures were procedures that are typically done with sedation.  . DDD (degenerative disc disease), cervical   . Depression   . Dysrhythmia    "tachycardia" (12/11/2011) new onset afib 10/15/14 EKG  . E coli bacteremia 06/18/2011  . ESRD (end stage renal disease) ([place]) 06/12/2011  . Fibromyalgia   . Gastroparesis   . Gastroparesis   . GERD (gastroesophageal reflux disease)   . Glaucoma    right eye  . Gout   . H/O carpal tunnel syndrome   . Headache(784.0)    "not often anymore" (12/11/2011)  . Herpes genitalia 1994  . History of blood transfusion    "more than a few times" (12/11/2011)  . History of stomach ulcers   . Hypotension   . Iron deficiency anemia   . New onset a-fib ([place])    10/15/14 EKG  . Osteopenia   . Seizures ([place]) 1994   "post transplant; only have had that one" (12/11/2011)  . Spinal stenosis in cervical region   . Stroke [facility])     left basal ganglia lacunar infarct; Right frontal lobe lacunar infarct.  . Stroke [facility]) ~ 1999; 2001   "briefly lost my vision; lost my right eye" (12/11/2011)    Past Surgical History:  Procedure Laterality Date  . ANTERIOR CERVICAL DECOMP/DISCECTOMY FUSION N/A 01/08/2015   Procedure: Anterior Cervical Three-Four/Four-Five Decompression/Diskectomy/Fusion;  Surgeon: Leeroy Cha, MD;  Location: [place] NEURO ORS;  Service: Neurosurgery;  Laterality: N/A;  C3-4 C4-5  Anterior cervical decompression/diskectomy/fusion  . APPENDECTOMY  ~ 2004  . CATARACT EXTRACTION     right eye  . COLONOSCOPY    . ENUCLEATION  2001   "right"  . ESOPHAGOGASTRODUODENOSCOPY (EGD) WITH PROPOFOL N/A 04/21/2012   Procedure: ESOPHAGOGASTRODUODENOSCOPY (EGD) WITH PROPOFOL;  Surgeon: Milus Banister, MD;  Location[facility];  Service: Endoscopy;  Laterality: N/A;  . 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 [place]; 1999; 2005   "right"  . MULTIPLE TOOTH EXTRACTIONS    . TONSILLECTOMY    . [place]?; 1994; 2005     reports that she has been smoking Cigarettes.  She has a 5.60 pack-year smoking history. She has never used smokeless tobacco. She reports that she drinks alcohol. She reports that she uses drugs, including Marijuana.  Allergies  Allergen Reactions  . Levofloxacin Itching and Rash    Family History  Problem Relation Age of Onset  . Glaucoma Mother   . Multiple sclerosis Brother   . Hypertension Maternal Grandmother     Prior to Admission medications   Medication Sig Start Date End Date Taking? Authorizing Provider  ALPRAZolam (XANAX) 0.25 MG tablet Take 0.25 mg by mouth daily as needed (FOR SVT).   Yes Historical Provider, MD  aspirin EC 81 MG tablet Take 81 mg by mouth every morning.    Yes Historical Provider, MD  azaTHIOprine (IMURAN) 50 MG tablet Take 100 mg by mouth daily.    Yes Historical Provider, MD  cefdinir (OMNICEF) 300 MG capsule Take 1 capsule (300 mg total) by mouth 2 (two) times daily. 09/26/15  Yes Orpah Greek, MD  colchicine 0.6 MG tablet Take 0.6 mg by mouth daily as needed (For gout.).   Yes Historical Provider, MD  furosemide (LASIX) 40 MG tablet Take 40 mg by mouth at bedtime.  06/22/11  Yes [name], MD  metoCLOPramide (REGLAN) 5 MG tablet Take 5 mg by mouth 2 (two) times daily. 5 mg every morning and 5 mg every evening at bedtime.  As needed during the day 08/07/15  Yes Historical Provider, MD  omeprazole (PRILOSEC) 40 MG capsule Take 40 mg by mouth 2 (two) times daily.    Yes Historical Provider, MD  oxyCODONE-acetaminophen (PERCOCET) 10-325 MG tablet Take 1 tablet by mouth every 6 (six) hours as needed for pain.   Yes Historical Provider, MD  predniSONE (DELTASONE) 5 MG tablet Take 5 mg by mouth daily. 06/22/11  Yes [name], MD  Prenatal Vit-Fe Fumarate-FA (PRENATAL VITAMIN PO) Take 1 tablet by mouth daily.     Yes Historical Provider, MD  promethazine (PHENERGAN) 25 MG tablet Take 1 tablet (25 mg total) by mouth every 6 (six) hours as needed for nausea or vomiting. 06/18/15  Yes Antonietta Breach, PA-C  tacrolimus (PROGRAF) 1 MG capsule Take 3 mg by mouth 2 (two) times daily.  06/22/11  Yes [name], MD  zolpidem (AMBIEN) 10 MG tablet Take 10 mg by mouth at bedtime as needed for sleep.   Yes Historical Provider, MD     Constitutional: Looks chronically ill. Vitals:   09/29/15 2000 09/29/15 2015 09/29/15 2030 09/29/15 2100  BP: 95/63 91/60 98/67  94/56  Pulse:      Resp: 25 26 17 23   Temp:      TempSrc:      SpO2:  Eyes: Right eye blindness, lids and conjunctivae look pale. ENMT: Mucous membranes are mildly dry. Posterior pharynx clear of any exudate or lesions.   Neck: normal, supple, no masses, no thyromegaly Respiratory: Reproducible left chest wall tenderness, Decreased breath sounds on both bases left> right, mild ronchi, no wheezing, no crackles. Mildly tachypneic at times. No accessory muscle use.  Cardiovascular: Regular rate and rhythm, no murmurs / rubs / gallops. No extremity edema. 2+ pedal pulses. No carotid bruits.  Abdomen: Bowel sounds positive. Soft, no tenderness, no masses palpated. No hepatosplenomegaly.  Musculoskeletal: no clubbing / cyanosis. Good ROM, no contractures. Normal muscle tone.  Skin: no rashes, lesions, ulcers. No induration Neurologic: CN 2-12 grossly intact. Sensation intact, DTR normal. Strength 5/5 in all 4.  Psychiatric: Normal judgment and insight. Alert and oriented x 4. Mildly anxious mood.      Labs on Admission: I have personally reviewed following labs and imaging studies  CBC:  Recent Labs Lab 09/25/15 1943 09/29/15 1648  WBC 4.7 5.6  HGB 9.3* 7.8*  HCT 29.4* 24.0*  MCV 96.7 94.9  PLT 162 [id number]   Basic Metabolic Panel:  Recent Labs Lab 09/25/15 1943 09/29/15 1648  NA 137 126*  K 4.3 4.2  CL 104 96*  CO2 25 21*    GLUCOSE 99 148*  BUN 30* 46*  CREATININE 2.56* 3.41*  CALCIUM 9.7 9.0   GFR: Estimated Creatinine Clearance: 13.8 mL/min (by C-G formula based on SCr of 3.41 mg/dL).  Urine analysis:    Component Value Date/Time   COLORURINE AMBER (A) 09/29/2015 1940   APPEARANCEUR CLOUDY (A) 09/29/2015 1940   LABSPEC 1.022 09/29/2015 1940   PHURINE 5.0 09/29/2015 1940   GLUCOSEU NEGATIVE 09/29/2015 1940   HGBUR NEGATIVE 09/29/2015 1940   BILIRUBINUR SMALL (A) 09/29/2015 1940   KETONESUR 15 (A) 09/29/2015 1940   PROTEINUR 30 (A) 09/29/2015 1940   UROBILINOGEN 0.2 10/15/2014 0946   NITRITE NEGATIVE 09/29/2015 1940   LEUKOCYTESUR TRACE (A) 09/29/2015 1940     Radiological Exams on Admission: Dg Chest 2 View  Result Date: 09/29/2015 CLINICAL DATA:  Chest pain and shortness of Breath EXAM: CHEST  2 VIEW COMPARISON:  09/25/2015 FINDINGS: Moderate cardiac enlargement. There is a left pleural effusion which appears increased from previous exam. Overlying atelectasis noted. Pulmonary vascular congestion is noted. Right lung appears clear. Stent is noted within the superior vena cava. IMPRESSION: 1. Increase in volume of left pleural effusion. Electronically Signed   By: Kerby Moors M.D.   On: 09/29/2015 17:39    EKG: Independently reviewed. Vent. rate 80 BPM PR interval 142 ms QRS duration 64 ms QT/QTc 394/454 ms P-R-T axes 81 77 85 Normal sinus rhythm Normal ECG  Assessment/Plan Principal Problem:   Chest pain Pain is pleuritic. Normal EKG. The patient had low probability for PE VQ scan results. 4 days ago. Admit to observation/telemetry. Continue supplemental oxygen. Analgesics as needed. Trend troponin levels. Check echocardiogram in AM.  Active Problems:   Acute on chronic renal failure [facility] The patient received 1 L of normal saline in the emergency department. Given shortness of breath and pleural effusion will defer further IV fluids for now. Hold furosemide and follow-up  electrolytes, BUN/creatinine in the morning.    ESRD (end stage renal disease) (HCC) Monitor electrolytes, BUN/creatinine.   History of renal transplant Continue Azathioprine 100 mg po daily. Continue Prograf 3 mg po bid. Continue prednisone 5 mg po daily.    Gastroparesis Mildly nauseous now, but no history of  recent emesis.  Continue metoclopramide 5 mg by mouth twice a day. Continue promethazine 25 mg by mouth every 6 hours as needed.      Anxiety Continue alprazolam 0.25 mg po daily PRN.    Chronic pain disorder Continue percocet 10/325 mg po every 6 hours PRN.    Anemia in chronic kidney disease Monitor hematocrit and hemoglobin.     DVT prophylaxis: Lovenox SQ. Code Status: Full code. Family Communication:  Disposition Plan: Admit for troponin levels trending, echocardiogram, nephrology evaluation. Consults called: Nephrology (Dr. Roney Jaffe) Admission status: Observation/telemetry.   Reubin Milan MD [facility] Pager [phone].  If 7PM-7AM, please contact night-coverage [email or web address] Password [facility]  09/29/2015, 9:14 PM

## 2015-09-29 NOTE — ED Triage Notes (Addendum)
Pt seen 09-25-15 for chest pain and shortness of breath.  Pt left for trip next day and just got to ED from airport.  Pts symptoms are not improved.  Pt talking in short phrases at times, shallow breathing and grunts at times.

## 2015-09-29 NOTE — ED Provider Notes (Signed)
[facility] DEPT Provider Note   CSN: [id number] Arrival date & time: 09/29/15  1640  History   Chief Complaint Chief Complaint  Patient presents with  . Chest Pain   HPI   Tina Watkins is an 50 y.o. female with complex medical history including ESRD now s/p renal transplant, DM, gastroparesis, stroke, afib, chronic hypotension who presents to the ED for evaluation of chest pain and shortness of breath. She states her symptoms started about four days ago. She was seen in the ED on 8/23 for same complaints with negative workup at that time including negative delta troponin and VQ scan. She states the past few days she has been out of town in [place] and returned to the area today. She states she came to the ED for peristent chest pain and shortness of breath. She states it is not exertional. The pain is constant. It does not radiate. It is localized to the center of her chest. It is not worse with breathing. She has not tried anything for her pain. She states she needs dilaudid. Endorses mild intermittent nausea without emesis. Denies cough or congestion. Denies abdominal pain, diarrhea, urinary symptoms. Denies feeling weak or lightheaded.  Past Medical History:  Diagnosis Date  . Bacteremia due to Gram-negative bacteria ([place]) 05/23/2011  . Blind right eye   . Chronic lower back pain   . Complication of anesthesia    "woke up during OR; I have an extremely high tolerance" (12/11/2011) 1 procedure was graft; the other procedures were procedures that are typically done with sedation.  . DDD (degenerative disc disease), cervical   . Depression   . Dysrhythmia    "tachycardia" (12/11/2011) new onset afib 10/15/14 EKG  . E coli bacteremia 06/18/2011  . ESRD (end stage renal disease) ([place]) 06/12/2011  . Fibromyalgia   . Gastroparesis   . Gastroparesis   . GERD (gastroesophageal reflux disease)   . Glaucoma    right eye  . Gout   . H/O carpal tunnel syndrome   . Headache(784.0)    "not often anymore" (12/11/2011)  . Herpes genitalia 1994  . History of blood transfusion    "more than a few times" (12/11/2011)  . History of stomach ulcers   . Hypotension   . Iron deficiency anemia   . New onset a-fib ([place])    10/15/14 EKG  . Osteopenia   . Seizures ([place]) 1994   "post transplant; only have had that one" (12/11/2011)  . Spinal stenosis in cervical region   . Stroke [facility])     left basal ganglia lacunar infarct; Right frontal lobe lacunar infarct.  . Stroke [facility]) ~ 1999; 2001   "briefly lost my vision; lost my right eye" (12/11/2011)    Patient Active Problem List   Diagnosis Date Noted  . Cervical stenosis of spinal canal 01/08/2015  . Urinary tract infectious disease   . Muscle spasms of neck 06/15/2014  . Bleeding hemorrhoid 06/15/2014  . Anemia in chronic kidney disease 06/15/2014  . Acute on chronic renal failure ([place]) 06/13/2014  . Pyelonephritis, acute 06/13/2014  . Sepsis ([place]) 06/13/2014  . History of renal transplant   . Chronic pain disorder 04/09/2012  . Dehydration, mild 04/09/2012  . Gout attack 06/23/2011  . Herpes infection 06/23/2011  . Anxiety 06/23/2011  . E coli bacteremia 06/18/2011  . ESRD (end stage renal disease) ([place]) 06/12/2011  . UTI (lower urinary tract infection) 06/12/2011  . Bacteremia due to Gram-negative bacteria ([place]) 05/23/2011  . History  of renal transplantation 05/22/2011  . Septic shock(785.52) 05/22/2011  . Acute on chronic kidney failure ([place]) 05/22/2011  . Gastroparesis 04/24/2008  . WEIGHT LOSS 08/24/2007  . NAUSEA AND VOMITING 08/24/2007    Past Surgical History:  Procedure Laterality Date  . ANTERIOR CERVICAL DECOMP/DISCECTOMY FUSION N/A 01/08/2015   Procedure: Anterior Cervical Three-Four/Four-Five Decompression/Diskectomy/Fusion;  Surgeon: Leeroy Cha, MD;  Location: [place] NEURO ORS;  Service: Neurosurgery;  Laterality: N/A;  C3-4 C4-5 Anterior cervical decompression/diskectomy/fusion  . APPENDECTOMY  ~ 2004    . CATARACT EXTRACTION     right eye  . COLONOSCOPY    . ENUCLEATION  2001   "right"  . ESOPHAGOGASTRODUODENOSCOPY (EGD) WITH PROPOFOL N/A 04/21/2012   Procedure: ESOPHAGOGASTRODUODENOSCOPY (EGD) WITH PROPOFOL;  Surgeon: Milus Banister, MD;  [facility];  Service: Endoscopy;  Laterality: N/A;  . 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 [place]; 1999; 2005   "right"  . MULTIPLE TOOTH EXTRACTIONS    . TONSILLECTOMY    . [place]?; 1994; 2005    OB History    No data available       Home Medications    Prior to Admission medications   Medication Sig Start Date End Date Taking? Authorizing Provider  ALPRAZolam (XANAX) 0.25 MG tablet Take 0.25 mg by mouth daily as needed (FOR SVT).    Historical Provider, MD  aspirin EC 81 MG tablet Take 81 mg by mouth daily.    Historical Provider, MD  azaTHIOprine (IMURAN) 50 MG tablet Take 100 mg by mouth daily.     Historical Provider, MD  cefdinir (OMNICEF) 300 MG capsule Take 1 capsule (300 mg total) by mouth 2 (two) times daily. 09/26/15   Orpah Greek, MD  colchicine 0.6 MG tablet Take 0.6 mg by mouth daily as needed (For gout.).    Historical Provider, MD  eszopiclone (LUNESTA) 1 MG TABS tablet Take 1 mg by mouth at bedtime as needed for sleep. Take immediately before bedtime    Historical Provider, MD  furosemide (LASIX) 40 MG tablet Take 40 mg by mouth daily.  06/22/11   [name], MD  omeprazole (PRILOSEC) 40 MG capsule Take 40 mg by mouth 2 (two) times daily.     Historical Provider, MD  oxyCODONE-acetaminophen (PERCOCET) 10-325 MG tablet Take 1 tablet by mouth every 6 (six) hours as needed for pain.    Historical Provider, MD  predniSONE (DELTASONE) 5 MG tablet Take 5 mg by mouth daily. 06/22/11   [name], MD  Prenatal Vit-Fe Fumarate-FA (PRENATAL VITAMIN PO) Take 1 tablet by mouth daily.     Historical  Provider, MD  promethazine (PHENERGAN) 25 MG tablet Take 1 tablet (25 mg total) by mouth every 6 (six) hours as needed for nausea or vomiting. 06/18/15   Antonietta Breach, PA-C  tacrolimus (PROGRAF) 1 MG capsule Take 3 mg by mouth 2 (two) times daily.  06/22/11   [name], MD  zolpidem (AMBIEN) 10 MG tablet Take 10 mg by mouth at bedtime as needed for sleep.    Historical Provider, MD    Family History Family History  Problem Relation Age of Onset  . Glaucoma Mother   . Multiple sclerosis Brother   . Hypertension Maternal Grandmother     Social History Social History  Substance Use Topics  . Smoking status: Current Every Day Smoker  Packs/day: 0.20    Years: 28.00    Types: Cigarettes  . Smokeless tobacco: Never Used     Comment: had quit 03/05/2011  . Alcohol use Yes     Comment: rare     Allergies   Levofloxacin   Review of Systems Review of Systems 10 Systems reviewed and are negative for acute change except as noted in the HPI.  Physical Exam Updated Vital Signs BP 100/56 (BP Location: Right Arm)   Pulse 65   Temp 98.7 F (37.1 C) (Oral)   Resp (!) 28   SpO2 100%   Physical Exam  Constitutional: She is oriented to person, place, and time.  Chronically ill appearing Intermittently tachypneic At times rubbing her chest  HENT:  Right Ear: External ear normal.  Left Ear: External ear normal.  Nose: Nose normal.  Mouth/Throat: No oropharyngeal exudate.  MM dry  Eyes:  R eye blind  Neck: Neck supple.  Cardiovascular: Normal rate, regular rhythm, normal heart sounds and intact distal pulses.   Pulmonary/Chest: She has no wheezes.  Intermittently tachypneic with increased WOB then will normalize in effort Soft left basilar rhonchi  Abdominal: Soft. Bowel sounds are normal. She exhibits no distension. There is no tenderness. There is no rebound and no guarding.  Musculoskeletal: She exhibits no edema.  No LE edema No calf tenderness  Lymphadenopathy:     She has no cervical adenopathy.  Neurological: She is alert and oriented to person, place, and time. No cranial nerve deficit.  Skin: Skin is warm and dry.  Psychiatric: She has a normal mood and affect.  Nursing note and vitals reviewed.  ED Treatments / Results  Labs (all labs ordered are listed, but only abnormal results are displayed) Labs Reviewed  BASIC METABOLIC PANEL - Abnormal; Notable for the following:       Result Value   Sodium 126 (*)    Chloride 96 (*)    CO2 21 (*)    Glucose, Bld 148 (*)    BUN 46 (*)    Creatinine, Ser 3.41 (*)    GFR calc non Af Amer 15 (*)    GFR calc Af Amer 17 (*)    All other components within normal limits  CBC - Abnormal; Notable for the following:    RBC 2.53 (*)    Hemoglobin 7.8 (*)    HCT 24.0 (*)    RDW 20.7 (*)    All other components within normal limits  URINALYSIS, ROUTINE W REFLEX MICROSCOPIC (NOT AT [facility])  OCCULT BLOOD X 1 CARD TO LAB, STOOL  I-STAT TROPOININ, ED    EKG  EKG Interpretation None       Radiology Dg Chest 2 View  Result Date: 09/29/2015 CLINICAL DATA:  Chest pain and shortness of Breath EXAM: CHEST  2 VIEW COMPARISON:  09/25/2015 FINDINGS: Moderate cardiac enlargement. There is a left pleural effusion which appears increased from previous exam. Overlying atelectasis noted. Pulmonary vascular congestion is noted. Right lung appears clear. Stent is noted within the superior vena cava. IMPRESSION: 1. Increase in volume of left pleural effusion. Electronically Signed   By: Kerby Moors M.D.   On: 09/29/2015 17:39    Procedures Procedures (including critical care time)  Medications Ordered in ED Medications - No data to display   Initial Impression / Assessment and Plan / ED Course  I have reviewed the triage vital signs and the nursing notes.  Pertinent labs & imaging results that were available during my  care of the patient were reviewed by me and considered in my medical decision making (see  chart for details).  Clinical Course   Tina Watkins is an 50 y.o. female renal transplant pt with persistent chest pain and shortness of breath over the past four days. Seen in the ED few days ago with  Negative workup including VQ scan. Symptoms have persisted. In the ED she does appear uncomfortable, at times tachypneic and at times clutching her chest. Pain is not exertional but seems somewhat positional. More concerning is her worsening renal function. Hemoglobin today is also lower than last week at 7.8. Denies BRBPR, black tarry stools, hematemesis. Heme occult is pending. I spoke with Dr. Jonnie Finner of nephrology. Given persistent chest pain and SOB with her history and worsening kidney function would admit to medicine for obs for further evaluation and IV hydration. Pt has been travelling and is perhaps a bit dehydrated. She does have some interval development of left pleural effusion and her chest pain could certainly be some pleurisy,. She is not hypoxic or tachycardic. Her BP is at her baseline.  7:55 PM I spoke with Dr. Olevia Bowens who will admit pt to tele obs.  Final Clinical Impressions(s) / ED Diagnoses   Final diagnoses:  Chest pain, unspecified chest pain type  AKI (acute kidney injury) [facility])    New Prescriptions New Prescriptions   No medications on file     Carmelina Dane 09/29/15 1955    Dorie Rank, MD 09/30/15 [phone]

## 2015-09-30 ENCOUNTER — Encounter (HOSPITAL_COMMUNITY): Payer: Self-pay

## 2015-09-30 ENCOUNTER — Observation Stay (HOSPITAL_BASED_OUTPATIENT_CLINIC_OR_DEPARTMENT_OTHER): Payer: Medicaid Other

## 2015-09-30 ENCOUNTER — Observation Stay (HOSPITAL_COMMUNITY): Payer: Medicaid Other

## 2015-09-30 DIAGNOSIS — R609 Edema, unspecified: Secondary | ICD-10-CM | POA: Diagnosis not present

## 2015-09-30 DIAGNOSIS — K219 Gastro-esophageal reflux disease without esophagitis: Secondary | ICD-10-CM | POA: Diagnosis present

## 2015-09-30 DIAGNOSIS — H5441 Blindness, right eye, normal vision left eye: Secondary | ICD-10-CM | POA: Diagnosis present

## 2015-09-30 DIAGNOSIS — Z940 Kidney transplant status: Secondary | ICD-10-CM

## 2015-09-30 DIAGNOSIS — K3184 Gastroparesis: Secondary | ICD-10-CM

## 2015-09-30 DIAGNOSIS — G894 Chronic pain syndrome: Secondary | ICD-10-CM

## 2015-09-30 DIAGNOSIS — F17210 Nicotine dependence, cigarettes, uncomplicated: Secondary | ICD-10-CM | POA: Diagnosis present

## 2015-09-30 DIAGNOSIS — R079 Chest pain, unspecified: Secondary | ICD-10-CM

## 2015-09-30 DIAGNOSIS — N189 Chronic kidney disease, unspecified: Secondary | ICD-10-CM

## 2015-09-30 DIAGNOSIS — Z7952 Long term (current) use of systemic steroids: Secondary | ICD-10-CM | POA: Diagnosis not present

## 2015-09-30 DIAGNOSIS — F419 Anxiety disorder, unspecified: Secondary | ICD-10-CM | POA: Diagnosis present

## 2015-09-30 DIAGNOSIS — T8619 Other complication of kidney transplant: Secondary | ICD-10-CM | POA: Diagnosis present

## 2015-09-30 DIAGNOSIS — J189 Pneumonia, unspecified organism: Secondary | ICD-10-CM | POA: Diagnosis not present

## 2015-09-30 DIAGNOSIS — D631 Anemia in chronic kidney disease: Secondary | ICD-10-CM

## 2015-09-30 DIAGNOSIS — Z9841 Cataract extraction status, right eye: Secondary | ICD-10-CM | POA: Diagnosis not present

## 2015-09-30 DIAGNOSIS — I120 Hypertensive chronic kidney disease with stage 5 chronic kidney disease or end stage renal disease: Secondary | ICD-10-CM | POA: Diagnosis present

## 2015-09-30 DIAGNOSIS — E1143 Type 2 diabetes mellitus with diabetic autonomic (poly)neuropathy: Secondary | ICD-10-CM | POA: Diagnosis present

## 2015-09-30 DIAGNOSIS — J90 Pleural effusion, not elsewhere classified: Secondary | ICD-10-CM | POA: Diagnosis present

## 2015-09-30 DIAGNOSIS — Z8673 Personal history of transient ischemic attack (TIA), and cerebral infarction without residual deficits: Secondary | ICD-10-CM | POA: Diagnosis not present

## 2015-09-30 DIAGNOSIS — I480 Paroxysmal atrial fibrillation: Secondary | ICD-10-CM | POA: Diagnosis present

## 2015-09-30 DIAGNOSIS — Z981 Arthrodesis status: Secondary | ICD-10-CM | POA: Diagnosis not present

## 2015-09-30 DIAGNOSIS — Z7982 Long term (current) use of aspirin: Secondary | ICD-10-CM | POA: Diagnosis not present

## 2015-09-30 DIAGNOSIS — N179 Acute kidney failure, unspecified: Secondary | ICD-10-CM | POA: Diagnosis present

## 2015-09-30 DIAGNOSIS — G8929 Other chronic pain: Secondary | ICD-10-CM | POA: Diagnosis present

## 2015-09-30 DIAGNOSIS — N186 End stage renal disease: Secondary | ICD-10-CM | POA: Diagnosis present

## 2015-09-30 DIAGNOSIS — E1122 Type 2 diabetes mellitus with diabetic chronic kidney disease: Secondary | ICD-10-CM | POA: Diagnosis present

## 2015-09-30 DIAGNOSIS — E871 Hypo-osmolality and hyponatremia: Secondary | ICD-10-CM | POA: Diagnosis not present

## 2015-09-30 DIAGNOSIS — I959 Hypotension, unspecified: Secondary | ICD-10-CM | POA: Diagnosis present

## 2015-09-30 DIAGNOSIS — E785 Hyperlipidemia, unspecified: Secondary | ICD-10-CM | POA: Diagnosis present

## 2015-09-30 LAB — COMPREHENSIVE METABOLIC PANEL
ALT: 19 U/L (ref 14–54)
AST: 20 U/L (ref 15–41)
Albumin: 3 g/dL — ABNORMAL LOW (ref 3.5–5.0)
Alkaline Phosphatase: 74 U/L (ref 38–126)
Anion gap: 10 (ref 5–15)
BUN: 46 mg/dL — ABNORMAL HIGH (ref 6–20)
CO2: 22 mmol/L (ref 22–32)
Calcium: 8.9 mg/dL (ref 8.9–10.3)
Chloride: 102 mmol/L (ref 101–111)
Creatinine, Ser: 3.24 mg/dL — ABNORMAL HIGH (ref 0.44–1.00)
GFR calc Af Amer: 18 mL/min — ABNORMAL LOW (ref >60)
GFR calc non Af Amer: 16 mL/min — ABNORMAL LOW (ref >60)
Glucose, Bld: 100 mg/dL — ABNORMAL HIGH (ref 65–99)
Potassium: 4.4 mmol/L (ref 3.5–5.1)
Sodium: 134 mmol/L — ABNORMAL LOW (ref 135–145)
Total Bilirubin: 0.7 mg/dL (ref 0.3–1.2)
Total Protein: 5.9 g/dL — ABNORMAL LOW (ref 6.5–8.1)

## 2015-09-30 LAB — CBC
HCT: 21.4 % — ABNORMAL LOW (ref 36.0–46.0)
HCT: 22.3 % — ABNORMAL LOW (ref 36.0–46.0)
Hemoglobin: 6.9 g/dL — CL (ref 12.0–15.0)
Hemoglobin: 7.1 g/dL — ABNORMAL LOW (ref 12.0–15.0)
MCH: 30.4 pg (ref 26.0–34.0)
MCH: 30 pg (ref 26.0–34.0)
MCHC: 32.2 g/dL (ref 30.0–36.0)
MCHC: 31.8 g/dL (ref 30.0–36.0)
MCV: 94.3 fL (ref 78.0–100.0)
MCV: 94.1 fL (ref 78.0–100.0)
Platelets: 198 10*3/uL (ref 150–400)
Platelets: 213 10*3/uL (ref 150–400)
RBC: 2.27 MIL/uL — ABNORMAL LOW (ref 3.87–5.11)
RBC: 2.37 MIL/uL — ABNORMAL LOW (ref 3.87–5.11)
RDW: 20.8 % — ABNORMAL HIGH (ref 11.5–15.5)
RDW: 20.5 % — ABNORMAL HIGH (ref 11.5–15.5)
WBC: 3.8 10*3/uL — ABNORMAL LOW (ref 4.0–10.5)
WBC: 4.6 10*3/uL (ref 4.0–10.5)

## 2015-09-30 LAB — PREPARE RBC (CROSSMATCH)

## 2015-09-30 LAB — TROPONIN I
Troponin I: <0.03 ng/mL
Troponin I: <0.03 ng/mL

## 2015-09-30 LAB — ABO/RH: ABO/RH(D): B POS

## 2015-09-30 LAB — IRON AND TIBC
Iron: 18 ug/dL — ABNORMAL LOW (ref 28–170)
Saturation Ratios: 14 % (ref 10.4–31.8)
TIBC: 126 ug/dL — ABNORMAL LOW (ref 250–450)
UIBC: 108 ug/dL

## 2015-09-30 LAB — FERRITIN: Ferritin: 2503 ng/mL — ABNORMAL HIGH (ref 11–307)

## 2015-09-30 LAB — STREP PNEUMONIAE URINARY ANTIGEN: Strep Pneumo Urinary Antigen: NEGATIVE

## 2015-09-30 MED ORDER — SODIUM CHLORIDE 0.9 % IV SOLN
Freq: Once | INTRAVENOUS | Status: AC
  Administered 2015-09-30: 06:00:00 via INTRAVENOUS

## 2015-09-30 MED ORDER — ENOXAPARIN SODIUM 30 MG/0.3ML ~~LOC~~ SOLN
30.0000 mg | SUBCUTANEOUS | Status: DC
  Administered 2015-10-01: 30 mg via SUBCUTANEOUS
  Filled 2015-09-30: qty 0.3

## 2015-09-30 MED ORDER — DEXTROSE 5 % IV SOLN
1.0000 g | INTRAVENOUS | Status: DC
  Administered 2015-09-30 – 2015-10-02 (×3): 1 g via INTRAVENOUS
  Filled 2015-09-30 (×4): qty 10

## 2015-09-30 MED ORDER — PREDNISONE 5 MG PO TABS
5.0000 mg | ORAL_TABLET | Freq: Every day | ORAL | Status: DC
  Administered 2015-09-30 – 2015-10-03 (×4): 5 mg via ORAL
  Filled 2015-09-30 (×4): qty 1

## 2015-09-30 MED ORDER — SODIUM CHLORIDE 0.9 % IV SOLN
INTRAVENOUS | Status: AC
  Administered 2015-09-30: 12:00:00 via INTRAVENOUS

## 2015-09-30 MED ORDER — DIPHENHYDRAMINE HCL 50 MG/ML IJ SOLN: 25.0000 mg | Freq: Four times a day (QID) | INTRAMUSCULAR | Status: DC | PRN

## 2015-09-30 MED ORDER — AZITHROMYCIN 500 MG IV SOLR
500.0000 mg | INTRAVENOUS | Status: DC
  Administered 2015-09-30 – 2015-10-01 (×2): 500 mg via INTRAVENOUS
  Filled 2015-09-30 (×4): qty 500

## 2015-09-30 MED ORDER — PANTOPRAZOLE SODIUM 40 MG PO TBEC
40.0000 mg | DELAYED_RELEASE_TABLET | Freq: Every day | ORAL | Status: DC
  Administered 2015-09-30 – 2015-10-03 (×5): 40 mg via ORAL
  Filled 2015-09-30 (×5): qty 1

## 2015-09-30 MED ORDER — DIPHENHYDRAMINE HCL 25 MG PO CAPS
25.0000 mg | ORAL_CAPSULE | Freq: Four times a day (QID) | ORAL | Status: DC | PRN
  Administered 2015-09-30 – 2015-10-01 (×2): 25 mg via ORAL
  Filled 2015-09-30 (×2): qty 1

## 2015-09-30 NOTE — Progress Notes (Addendum)
PROGRESS NOTE        PATIENT DETAILS Name: Tina Watkins Age: 50 y.o. Sex: female Date of Birth: 1965-04-02 Admit Date: 09/29/2015 Admitting Physician Reubin Milan, MD [id number] Coladonato, MD  Brief Narrative: Patient is a 50 y.o. female with past medical history of stage IV chronic kidney disease, renal transplantation on immunosuppressives who presented to the hospital with 1 week history of chest pain, subjective fever. She was seen in the emergency room on 8/23 for similar complaints, a VQ scan was read as low probability, she was given Omnicef-patient declined admission then, as she had a planned trip to [place]. She unfortunately continued to have chest pain, no longer having any fevers, she returned back from [place] yesterday and came straight to the emergency room where she was admitted to the hospitalist service.   Subjective: Continues to have left-sided chest pain-pain is easily reproducible with gentle palpation, pain is also reproducible with movement of her left hand, and with deep breathing.  Assessment/Plan: Principal Problem: Chest pain: Very atypical-highly suggestive of musculoskeletal etiology as a disease reproducible with palpation, extension and abduction of her left upper extremity. EKG/troponins are negative. VQ scan on 8/24 was read as low probability. We will check lower extremity Doppler and echo, but doubt any further workup required at this time-unless new findings on echocardiogram. Supportive care only.  Active Problems: ? Left pleural effusion: Chest x-ray suggestive of a small left-sided pleural effusion-given immunocompromise state, subjective fevers, and chest pain-we will get a noncontrast CT of the chest to further evaluate. She has been on Omnicef since 8/24-for now I will discontinue and monitor off antibiotics.  Addendum 4:40 pm CT Chest shows Upper and lower lobe PNA-with effusion. Will start IV  Rocephin/Zithromax-spoke with Dr Elbert Ewings recommended USG guided drainage-have ordered.   Acute on chronic kidney disease stage IV: Creatinine downtrending with supportive measures, patient requests that we consult her nephrologist-have subsequently consulted nephrology. Continue to follow electrolytes, avoid nephrotoxic agents.  Hyponatremia: Suspect related to volume loss from fever/dehydration-resolved after gentle hydration.  History of renal transplant: Continue immunosuppressives-azathioprine/Prograf/prednisone. Await nephrology evaluation  Anemia: Has chronic anemia-likely secondary to renal disease at baseline, has had drop in hemoglobin-I suspect this is secondary to IV fluid dilution. She denies any history of hematochezia, melena or hematemesis. Patient is not keen on getting transfused, repeat CBC later today. She does not appear symptomatic from anemia currently. Defer to nephrology regarding initiation of Aranesp.  Borderline hypotension: Appears asymptomatic-renal function improving- follow for now-not sure this is her usual baseline-may need stress doses of steroids. Await nephrology input.  History of gastroparesis: No vomiting-continue Reglan  GERD: Continue PPI  Chronic pain disorder: Continue narcotics-may need dose adjustment if blood pressure continues to be soft.  Anxiety: Continue as needed Xanax  DVT Prophylaxis: Prophylactic Lovenox   Code Status: Full code  Family Communication: None at bedside  Disposition Plan: Remain inpatient-home in 1-2 days  Antimicrobial agents: Omnicef 8/24>>8/28  Procedures: None  CONSULTS:  nephrology  Time spent: 25 minutes-Greater than 50% of this time was spent in counseling, explanation of diagnosis, planning of further management, and coordination of care.  MEDICATIONS: Anti-infectives    Start     Dose/Rate Route Frequency Ordered Stop   09/30/15 1000  cefUROXime (CEFTIN) tablet 500 mg     500 mg Oral Daily  09/29/15 2318  Scheduled Meds: . aspirin EC  81 mg Oral BH-q7a  . azaTHIOprine  100 mg Oral Daily  . cefUROXime  500 mg Oral Daily  . enoxaparin (LOVENOX) injection  30 mg Subcutaneous Q24H  . metoCLOPramide  5 mg Oral BID  . pantoprazole  40 mg Oral Daily  . predniSONE  5 mg Oral QAC breakfast  . sodium chloride flush  3 mL Intravenous Q12H  . tacrolimus  3 mg Oral BID   Continuous Infusions:  PRN Meds:.acetaminophen, ALPRAZolam, colchicine, morphine injection, ondansetron (ZOFRAN) IV, oxyCODONE-acetaminophen **AND** oxyCODONE, promethazine, zolpidem   PHYSICAL EXAM: Vital signs: Vitals:   09/29/15 2230 09/29/15 2310 09/30/15 0432 09/30/15 0951  BP: (!) 94/52 93/80 (!) 91/52 (!) 85/35  Pulse: 67 63 62 67  Resp: (!) 8 (!) 24 (!) 22 20  Temp:  99.3 F (37.4 C) 98.4 F (36.9 C) 99 F (37.2 C)  TempSrc:  Oral Oral Oral  SpO2: 100% 100% 100% 96%  Weight:  51.4 kg (113 lb 6.4 oz) 51.5 kg (113 lb 8 oz)   Height:  5' (1.524 m)     Filed Weights   09/29/15 2310 09/30/15 0432  Weight: 51.4 kg (113 lb 6.4 oz) 51.5 kg (113 lb 8 oz)   Body mass index is 22.17 kg/m.   Gen Exam: Awake and alert with clear speech. Not in any distress.Appears frail and chronically sick appearing Neck: Supple, No JVD.   Chest: B/L Clear.   CVS: S1 S2 Regular, no murmurs. Left sided chest pain-near the third and fourth intercostal space approximately around the midline is exquisitely tender to gentle palpation Abdomen: soft, BS +, non tender, non distended.  Extremities: no edema, lower extremities warm to touch. Neurologic: Non Focal.   Skin: No Rash or lesions   Wounds: N/A.   I have personally reviewed following labs and imaging studies  LABORATORY DATA: CBC:  Recent Labs Lab 09/25/15 1943 09/29/15 1648 09/30/15 0518  WBC 4.7 5.6 3.8*  HGB 9.3* 7.8* 6.9*  HCT 29.4* 24.0* 21.4*  MCV 96.7 94.9 94.3  PLT 162 206 [id number]    Basic Metabolic Panel:  Recent Labs Lab 09/25/15 1943  09/29/15 1648 09/30/15 0518  NA 137 126* 134*  K 4.3 4.2 4.4  CL 104 96* 102  CO2 25 21* 22  GLUCOSE 99 148* 100*  BUN 30* 46* 46*  CREATININE 2.56* 3.41* 3.24*  CALCIUM 9.7 9.0 8.9    GFR: Estimated Creatinine Clearance: 15.3 mL/min (by C-G formula based on SCr of 3.24 mg/dL).  Liver Function Tests:  Recent Labs Lab 09/30/15 0518  AST 20  ALT 19  ALKPHOS 74  BILITOT 0.7  PROT 5.9*  ALBUMIN 3.0*   No results for input(s): LIPASE, AMYLASE in the last 168 hours. No results for input(s): AMMONIA in the last 168 hours.  Coagulation Profile: No results for input(s): INR, PROTIME in the last 168 hours.  Cardiac Enzymes:  Recent Labs Lab 09/29/15 2326 09/30/15 0518  TROPONINI <0.03 <0.03    BNP (last 3 results) No results for input(s): PROBNP in the last 8760 hours.  HbA1C: No results for input(s): HGBA1C in the last 72 hours.  CBG: No results for input(s): GLUCAP in the last 168 hours.  Lipid Profile: No results for input(s): CHOL, HDL, LDLCALC, TRIG, CHOLHDL, LDLDIRECT in the last 72 hours.  Thyroid Function Tests: No results for input(s): TSH, T4TOTAL, FREET4, T3FREE, THYROIDAB in the last 72 hours.  Anemia Panel: No results for input(s): VITAMINB12, FOLATE,  FERRITIN, TIBC, IRON, RETICCTPCT in the last 72 hours.  Urine analysis:    Component Value Date/Time   COLORURINE AMBER (A) 09/29/2015 1940   APPEARANCEUR CLOUDY (A) 09/29/2015 1940   LABSPEC 1.022 09/29/2015 1940   PHURINE 5.0 09/29/2015 1940   GLUCOSEU NEGATIVE 09/29/2015 1940   HGBUR NEGATIVE 09/29/2015 1940   BILIRUBINUR SMALL (A) 09/29/2015 1940   KETONESUR 15 (A) 09/29/2015 1940   PROTEINUR 30 (A) 09/29/2015 1940   UROBILINOGEN 0.2 10/15/2014 0946   NITRITE NEGATIVE 09/29/2015 1940   LEUKOCYTESUR TRACE (A) 09/29/2015 1940    Sepsis Labs: Lactic Acid, Venous    Component Value Date/Time   LATICACIDVEN 0.82 09/25/2015 2357    MICROBIOLOGY: No results found for this or any  previous visit (from the past 240 hour(s)).  RADIOLOGY STUDIES/RESULTS: Dg Chest 2 View  Result Date: 09/29/2015 CLINICAL DATA:  Chest pain and shortness of Breath EXAM: CHEST  2 VIEW COMPARISON:  09/25/2015 FINDINGS: Moderate cardiac enlargement. There is a left pleural effusion which appears increased from previous exam. Overlying atelectasis noted. Pulmonary vascular congestion is noted. Right lung appears clear. Stent is noted within the superior vena cava. IMPRESSION: 1. Increase in volume of left pleural effusion. Electronically Signed   By: Kerby Moors M.D.   On: 09/29/2015 17:39   Dg Chest 2 View  Result Date: 09/25/2015 CLINICAL DATA:  LEFT chest pain and dyspnea beginning this morning. Tachycardia. History of stent. EXAM: CHEST  2 VIEW COMPARISON:  Chest radiograph Jun 17, 2015 FINDINGS: Cardiac silhouette is mildly enlarged and unchanged. Stent projects in RIGHT mediastinum. Mild similar bronchitic changes in pulmonary vascular congestion without pleural effusion or focal consolidation. Mildly elevated RIGHT hemidiaphragm. No pneumothorax. ACDF. Surgical clips in the abdomen. IMPRESSION: Mild cardiomegaly. Similar bronchitic changes and mild pulmonary vascular congestion. Electronically Signed   By: Elon Alas M.D.   On: 09/25/2015 20:56   Nm Pulmonary Perf And Vent  Result Date: 09/26/2015 CLINICAL DATA:  Left-sided pleuritic chest pain. EXAM: NUCLEAR MEDICINE VENTILATION - PERFUSION LUNG SCAN TECHNIQUE: Ventilation images were obtained in multiple projections using inhaled aerosol Tc-86m DTPA. Perfusion images were obtained in multiple projections after intravenous injection of Tc-9m MAA. RADIOPHARMACEUTICALS:  31.2 mCi Technetium-30m DTPA aerosol inhalation and 4.06 mCi Technetium-60m MAA IV COMPARISON:  Chest radiograph yesterday. FINDINGS: Ventilation: No focal ventilation defect. Perfusion: No wedge shaped peripheral perfusion defects to suggest acute pulmonary embolism.  Liver activity is seen on perfusion imaging likely due to trans portal venous collaterals, multiple collaterals noted on CT abdomen/pelvis 06/13/2014. IMPRESSION: Low probability for pulmonary embolus. Electronically Signed   By: Jeb Levering M.D.   On: 09/26/2015 04:44   Mm Digital Screening Bilateral  Result Date: 09/02/2015 CLINICAL DATA:  Screening. EXAM: DIGITAL SCREENING BILATERAL MAMMOGRAM WITH CAD COMPARISON:  Previous exam(s). ACR Breast Density Category b: There are scattered areas of fibroglandular density. FINDINGS: There are no findings suspicious for malignancy. Images were processed with CAD. IMPRESSION: No mammographic evidence of malignancy. A result letter of this screening mammogram will be mailed directly to the patient. RECOMMENDATION: Screening mammogram in one year. (Code:SM-B-01Y) BI-RADS CATEGORY  1: Negative. Electronically Signed   By: Curlene Dolphin M.D.   On: 09/02/2015 15:41     LOS: 0 days   Oren Binet, MD  [facility] Pager:[phone]  If 7PM-7AM, please contact night-coverage [email or web address] Password [email or web address] 09/30/2015, 11:06 AM

## 2015-09-30 NOTE — Progress Notes (Signed)
Pt is refusing blood transfusion until the doctor talks with her. Doctor notified.

## 2015-09-30 NOTE — Consult Note (Signed)
[facility] Consult Note     Date: 09/30/2015                  Patient Name:  Tina Watkins  MRN: [id number]  DOB: [birth year]  Age / Sex: 50 y.o., female         PCP: Irena Cords, MD                 Service Requesting Consult: CHF                              Reason for Consult: AKI and hyponatremia in a transplant recipient  HPI: Pt is a 74F with a PMH significant for ESRD s/p living related donor transplant (her third) 2005 2/2 chronic GN, HTN, HLD, NSF, and gastroparesis who is now seen in consultation at the request of Dr. Jerral Ralph for evaluation and recommendations surrounding AKI and hyponatremia in a transplant recipient as well as immunosuppression recommendations.  Briefly, pt was admitted for severe chest pain 8/27.  She had presented to the ED on 8/23 with similar symptoms and was given Omnicef.  She had a planned trip to [place] so she left AMA.  She returned from [place] yesterday and came straight to the ED as she was still feeling poorly.  She was found to be hyponatremic with worsening anemia and an AKI.  She was admitted for workup of CP and for laboratory derangements.  Baseline creatinine has been labile.  It appears to be in the 1.8-2.6 range.  She had an AKI in July with creatinine peaking at 3.7.  Last outpatient hemoglobin was 9.9.  She has been getting Procrit 40,000u [place] every 2 weeks and states that she has not missed a dose.   She reports her last gastroparesis flareup was about a month ago.   No diarrhea.  No bloody stools.    Past Medical History:  Diagnosis Date  . Bacteremia due to Gram-negative bacteria (HCC) 05/23/2011  . Blind right eye   . Chronic lower back pain   . Complication of anesthesia    "woke up during OR; I have an extremely high tolerance" (12/11/2011) 1 procedure was graft; the other procedures were procedures that are typically done with sedation.  . DDD (degenerative disc disease), cervical   . Depression    . Dysrhythmia    "tachycardia" (12/11/2011) new onset afib 10/15/14 EKG  . E coli bacteremia 06/18/2011  . ESRD (end stage renal disease) (HCC) 06/12/2011  . Fibromyalgia   . Gastroparesis   . Gastroparesis   . GERD (gastroesophageal reflux disease)   . Glaucoma    right eye  . Gout   . H/O carpal tunnel syndrome   . Headache(784.0)    "not often anymore" (12/11/2011)  . Herpes genitalia 1994  . History of blood transfusion    "more than a few times" (12/11/2011)  . History of stomach ulcers   . Hypotension   . Iron deficiency anemia   . New onset a-fib (HCC)    10/15/14 EKG  . Osteopenia   . Seizures (HCC) 1994   "post transplant; only have had that one" (12/11/2011)  . Spinal stenosis in cervical region   . Stroke [facility])     left basal ganglia lacunar infarct; Right frontal lobe lacunar infarct.  . Stroke [facility]) ~ 1999; 2001   "briefly lost my vision; lost my right eye" (12/11/2011)    Past  Surgical History:  Procedure Laterality Date  . ANTERIOR CERVICAL DECOMP/DISCECTOMY FUSION N/A 01/08/2015   Procedure: Anterior Cervical Three-Four/Four-Five Decompression/Diskectomy/Fusion;  Surgeon: Leeroy Cha, MD;  Location: [facility];  Service: Neurosurgery;  Laterality: N/A;  C3-4 C4-5 Anterior cervical decompression/diskectomy/fusion  . APPENDECTOMY  ~ 2004  . CATARACT EXTRACTION     right eye  . COLONOSCOPY    . ENUCLEATION  2001   "right"  . ESOPHAGOGASTRODUODENOSCOPY (EGD) WITH PROPOFOL N/A 04/21/2012   Procedure: ESOPHAGOGASTRODUODENOSCOPY (EGD) WITH PROPOFOL;  Surgeon: Milus Banister, MD;  Location: [facility];  Service: Endoscopy;  Laterality: N/A;  . 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 [place]; 1999; 2005   "right"  . MULTIPLE TOOTH EXTRACTIONS    . TONSILLECTOMY    . TOTAL NEPHRECTOMY  1988?; 1994; 2005    Family History  Problem Relation Age of Onset  . Glaucoma Mother   .  Multiple sclerosis Brother   . Hypertension Maternal Grandmother    Social History:  reports that she has been smoking Cigarettes.  She has a 5.60 pack-year smoking history. She has never used smokeless tobacco. She reports that she drinks alcohol. She reports that she uses drugs, including Marijuana.  Allergies:  Allergies  Allergen Reactions  . Levofloxacin Itching and Rash    Medications Prior to Admission  Medication Sig Dispense Refill  . ALPRAZolam (XANAX) 0.25 MG tablet Take 0.25 mg by mouth daily as needed (FOR SVT).    [name] aspirin EC 81 MG tablet Take 81 mg by mouth every morning.     [name] azaTHIOprine (IMURAN) 50 MG tablet Take 100 mg by mouth daily.     . cefdinir (OMNICEF) 300 MG capsule Take 1 capsule (300 mg total) by mouth 2 (two) times daily. 20 capsule 0  . colchicine 0.6 MG tablet Take 0.6 mg by mouth daily as needed (For gout.).    [name] furosemide (LASIX) 40 MG tablet Take 40 mg by mouth at bedtime.     . metoCLOPramide (REGLAN) 5 MG tablet Take 5 mg by mouth 2 (two) times daily. 5 mg every morning and 5 mg every evening at bedtime.  As needed during the day  11  . omeprazole (PRILOSEC) 40 MG capsule Take 40 mg by mouth 2 (two) times daily.     [name] oxyCODONE-acetaminophen (PERCOCET) 10-325 MG tablet Take 1 tablet by mouth every 6 (six) hours as needed for pain.    . predniSONE (DELTASONE) 5 MG tablet Take 5 mg by mouth daily.    . Prenatal Vit-Fe Fumarate-FA (PRENATAL VITAMIN PO) Take 1 tablet by mouth daily.     . promethazine (PHENERGAN) 25 MG tablet Take 1 tablet (25 mg total) by mouth every 6 (six) hours as needed for nausea or vomiting. 12 tablet 0  . tacrolimus (PROGRAF) 1 MG capsule Take 3 mg by mouth 2 (two) times daily.     [name] zolpidem (AMBIEN) 10 MG tablet Take 10 mg by mouth at bedtime as needed for sleep.      Results for orders placed or performed during the hospital encounter of 09/29/15 (from the past 48 hour(s))  Basic metabolic panel     Status: Abnormal    Collection Time: 09/29/15  4:48 PM  Result Value Ref Range   Sodium 126 (L) 135 - 145 mmol/L   Potassium 4.2 3.5 - 5.1 mmol/L   Chloride 96 (  L) 101 - 111 mmol/L   CO2 21 (L) 22 - 32 mmol/L   Glucose, Bld 148 (H) 65 - 99 mg/dL   BUN 46 (H) 6 - 20 mg/dL   Creatinine, Ser 9.18 (H) 0.44 - 1.00 mg/dL   Calcium 9.0 8.9 - 59.9 mg/dL   GFR calc non Af Amer 15 (L) >60 mL/min   GFR calc Af Amer 17 (L) >60 mL/min    Comment: (NOTE) The eGFR has been calculated using the CKD EPI equation. This calculation has not been validated in all clinical situations. eGFR's persistently <60 mL/min signify possible Chronic Kidney Disease.    Anion gap 9 5 - 15  CBC     Status: Abnormal   Collection Time: 09/29/15  4:48 PM  Result Value Ref Range   WBC 5.6 4.0 - 10.5 K/uL   RBC 2.53 (L) 3.87 - 5.11 MIL/uL   Hemoglobin 7.8 (L) 12.0 - 15.0 g/dL   HCT 56.6 (L) 71.7 - 79.5 %   MCV 94.9 78.0 - 100.0 fL   MCH 30.8 26.0 - 34.0 pg   MCHC 32.5 30.0 - 36.0 g/dL   RDW 64.6 (H) 29.0 - 09.4 %   Platelets 206 150 - 400 K/uL  I-stat troponin, ED     Status: None   Collection Time: 09/29/15  5:17 PM  Result Value Ref Range   Troponin i, poc 0.00 0.00 - 0.08 ng/mL   Comment 3            Comment: Due to the release kinetics of cTnI, a negative result within the first hours of the onset of symptoms does not rule out myocardial infarction with certainty. If myocardial infarction is still suspected, repeat the test at appropriate intervals.   Urinalysis, Routine w reflex microscopic     Status: Abnormal   Collection Time: 09/29/15  7:40 PM  Result Value Ref Range   Color, Urine AMBER (A) YELLOW    Comment: BIOCHEMICALS MAY BE AFFECTED BY COLOR   APPearance CLOUDY (A) CLEAR   Specific Gravity, Urine 1.022 1.005 - 1.030   pH 5.0 5.0 - 8.0   Glucose, UA NEGATIVE NEGATIVE mg/dL   Hgb urine dipstick NEGATIVE NEGATIVE   Bilirubin Urine SMALL (A) NEGATIVE   Ketones, ur 15 (A) NEGATIVE mg/dL   Protein, ur 30 (A)  NEGATIVE mg/dL   Nitrite NEGATIVE NEGATIVE   Leukocytes, UA TRACE (A) NEGATIVE  Urine microscopic-add on     Status: Abnormal   Collection Time: 09/29/15  7:40 PM  Result Value Ref Range   Squamous Epithelial / LPF 6-30 (A) NONE SEEN   WBC, UA 0-5 0 - 5 WBC/hpf   RBC / HPF 0-5 0 - 5 RBC/hpf   Bacteria, UA FEW (A) NONE SEEN   Casts HYALINE CASTS (A) NEGATIVE    Comment: GRANULAR CAST   Urine-Other YEAST PRESENT   Troponin I     Status: None   Collection Time: 09/29/15 11:26 PM  Result Value Ref Range   Troponin I <0.03 <0.03 ng/mL  Type and screen [facility]     Status: None (Preliminary result)   Collection Time: 09/30/15  4:22 AM  Result Value Ref Range   ABO/RH(D) B POS    Antibody Screen NEG    Sample Expiration 10/03/2015    Unit Number [id number]    Blood Component Type RED CELLS,LR    Unit division 00    Status of Unit ALLOCATED    Transfusion Status  OK TO TRANSFUSE    Crossmatch Result Compatible   ABO/Rh     Status: None   Collection Time: 09/30/15  4:22 AM  Result Value Ref Range   ABO/RH(D) B POS   Troponin I     Status: None   Collection Time: 09/30/15  5:18 AM  Result Value Ref Range   Troponin I <0.03 <0.03 ng/mL  Comprehensive metabolic panel     Status: Abnormal   Collection Time: 09/30/15  5:18 AM  Result Value Ref Range   Sodium 134 (L) 135 - 145 mmol/L    Comment: DELTA CHECK NOTED   Potassium 4.4 3.5 - 5.1 mmol/L   Chloride 102 101 - 111 mmol/L   CO2 22 22 - 32 mmol/L   Glucose, Bld 100 (H) 65 - 99 mg/dL   BUN 46 (H) 6 - 20 mg/dL   Creatinine, Ser 3.24 (H) 0.44 - 1.00 mg/dL   Calcium 8.9 8.9 - 10.3 mg/dL   Total Protein 5.9 (L) 6.5 - 8.1 g/dL   Albumin 3.0 (L) 3.5 - 5.0 g/dL   AST 20 15 - 41 U/L   ALT 19 14 - 54 U/L   Alkaline Phosphatase 74 38 - 126 U/L   Total Bilirubin 0.7 0.3 - 1.2 mg/dL   GFR calc non Af Amer 16 (L) >60 mL/min   GFR calc Af Amer 18 (L) >60 mL/min    Comment: (NOTE) The eGFR has been calculated  using the CKD EPI equation. This calculation has not been validated in all clinical situations. eGFR's persistently <60 mL/min signify possible Chronic Kidney Disease.    Anion gap 10 5 - 15  CBC     Status: Abnormal   Collection Time: 09/30/15  5:18 AM  Result Value Ref Range   WBC 3.8 (L) 4.0 - 10.5 K/uL   RBC 2.27 (L) 3.87 - 5.11 MIL/uL   Hemoglobin 6.9 (LL) 12.0 - 15.0 g/dL    Comment: REPEATED TO VERIFY CRITICAL RESULT CALLED TO, READ BACK BY AND VERIFIED WITH: M.FUTRELL,RN [id number] 09/30/15 G.MCADOO    HCT 21.4 (L) 36.0 - 46.0 %   MCV 94.3 78.0 - 100.0 fL   MCH 30.4 26.0 - 34.0 pg   MCHC 32.2 30.0 - 36.0 g/dL   RDW 20.8 (H) 11.5 - 15.5 %   Platelets 198 150 - 400 K/uL  Prepare RBC     Status: None   Collection Time: 09/30/15  6:15 AM  Result Value Ref Range   Order Confirmation ORDER PROCESSED BY BLOOD BANK    Dg Chest 2 View  Result Date: 09/29/2015 CLINICAL DATA:  Chest pain and shortness of Breath EXAM: CHEST  2 VIEW COMPARISON:  09/25/2015 FINDINGS: Moderate cardiac enlargement. There is a left pleural effusion which appears increased from previous exam. Overlying atelectasis noted. Pulmonary vascular congestion is noted. Right lung appears clear. Stent is noted within the superior vena cava. IMPRESSION: 1. Increase in volume of left pleural effusion. Electronically Signed   By: Kerby Moors M.D.   On: 09/29/2015 17:39    Review of Systems  All other systems reviewed and are negative.   Blood pressure (!) 85/35, pulse 67, temperature 99 F (37.2 C), temperature source Oral, resp. rate 20, height 5' (1.524 m), weight 51.5 kg (113 lb 8 oz), SpO2 96 %. Physical Exam  Vitals reviewed.   GEN: chronically ill appearing but NAD HEENT: R eye partially closed and blind, L eye EOMI, PERRL NECK: flat neck veins PULM: normal WOB,  clear CV RRR no m/r/g ABD: soft, mildly distended, mildly tender in epigastric area and LLQ, graft in RLQ nontender EXT: no LE edema NEURO: AAO x  3 SKIN: hyperpigmented firm areas consistent with known diagnosis of NSF bilateral legs and thighs  Assessment/Plan  Pt is a 70F with a PMH significant for ESRD s/p living related donor transplant (her third) 2005 2/2 chronic GN, HTN, HLD, NSF, and gastroparesis who is being admitted for workup of chest pain.  She appears to be a chronically ill individual with multiple comorbidities.  She has a worsening anemia in the absence of blood loss, complicating matters as she is on her third kidney transplant.  Recommendations as follows:  1.  Acute kidney injury: Baseline creatinine is labile and I believe related to volume status.  It is improving with hydration.  Will trend; do not believe this AKI represents acute rejection.  Agree with gentle IVFs.  2.  s/p living related donor kidney transplantation 2005: secondary to chronic GN .  This is her third transplant as as such she is a highly sensitized individual.  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  Continue azathioprine 100 mg daily, prograf 3 mg BID, and prednisone 5 mg daily.  Would check a 12-hour prograf trough (this has been ordered for 0900 8/29).  3.  Hyponatremia: likely due to volume depletion, improving with IVF.  4.  Anemia: Last outpatient Hgb 08/2015 9.9; hemoglobin has dropped to 6.9-->7.1 without overt acute blood loss.  Transfusion threshold < 7.0, would only transfuse 1 u pRBCs at a time.  Will check iron studies and re-dose ESA as part of drop in Hgb may be due to inflammatory response in setting of illness. Check FOBT.  5.  Chest pain: has been occurring since 8/23. Had received omnicef, CT chest pending.  Trops negative.    Madelon Lips MD [facility] 09/30/2015, 11:36 AM

## 2015-09-30 NOTE — Progress Notes (Signed)
Verbal Order given by MD Ghimire to hold off on transfusion this morning, will continue to monitor patient Neta Mends RN 3:13 PM 09-27-2015

## 2015-09-30 NOTE — Progress Notes (Signed)
Hgb 6.9, no s/s, MD notified, will continue to monitor, Thanks

## 2015-09-30 NOTE — Progress Notes (Signed)
VASCULAR LAB PRELIMINARY  PRELIMINARY  PRELIMINARY  PRELIMINARY  Bilateral lower extremity venous duplex completed.    Preliminary report:  Bilateral:  No evidence of DVT, superficial thrombosis, or Baker's Cyst.   Edge Mauger, RVT 09/30/2015, 4:50 PM

## 2015-10-01 ENCOUNTER — Ambulatory Visit (HOSPITAL_COMMUNITY): Payer: Medicaid Other

## 2015-10-01 ENCOUNTER — Inpatient Hospital Stay (HOSPITAL_COMMUNITY): Payer: Medicaid Other

## 2015-10-01 DIAGNOSIS — R079 Chest pain, unspecified: Secondary | ICD-10-CM

## 2015-10-01 DIAGNOSIS — J189 Pneumonia, unspecified organism: Principal | ICD-10-CM

## 2015-10-01 LAB — LACTATE DEHYDROGENASE: LDH: 166 U/L (ref 98–192)

## 2015-10-01 LAB — RENAL FUNCTION PANEL
Albumin: 2.9 g/dL — ABNORMAL LOW (ref 3.5–5.0)
Anion gap: 10 (ref 5–15)
BUN: 46 mg/dL — ABNORMAL HIGH (ref 6–20)
CO2: 22 mmol/L (ref 22–32)
Calcium: 9.1 mg/dL (ref 8.9–10.3)
Chloride: 101 mmol/L (ref 101–111)
Creatinine, Ser: 2.74 mg/dL — ABNORMAL HIGH (ref 0.44–1.00)
GFR calc Af Amer: 22 mL/min — ABNORMAL LOW (ref >60)
GFR calc non Af Amer: 19 mL/min — ABNORMAL LOW (ref >60)
Glucose, Bld: 79 mg/dL (ref 65–99)
Phosphorus: 4 mg/dL (ref 2.5–4.6)
Potassium: 4.7 mmol/L (ref 3.5–5.1)
Sodium: 133 mmol/L — ABNORMAL LOW (ref 135–145)

## 2015-10-01 LAB — CBC
HCT: 19.9 % — ABNORMAL LOW (ref 36.0–46.0)
Hemoglobin: 6.6 g/dL — CL (ref 12.0–15.0)
MCH: 31 pg (ref 26.0–34.0)
MCHC: 33.2 g/dL (ref 30.0–36.0)
MCV: 93.4 fL (ref 78.0–100.0)
Platelets: 220 10*3/uL (ref 150–400)
RBC: 2.13 MIL/uL — ABNORMAL LOW (ref 3.87–5.11)
RDW: 20.6 % — ABNORMAL HIGH (ref 11.5–15.5)
WBC: 3.4 10*3/uL — ABNORMAL LOW (ref 4.0–10.5)

## 2015-10-01 LAB — ECHOCARDIOGRAM COMPLETE
Height: 60 in
Weight: 1852.8 oz

## 2015-10-01 LAB — HEMOGLOBIN AND HEMATOCRIT, BLOOD
HCT: 26.4 % — ABNORMAL LOW (ref 36.0–46.0)
Hemoglobin: 8.4 g/dL — ABNORMAL LOW (ref 12.0–15.0)

## 2015-10-01 MED ORDER — OXYCODONE HCL 5 MG PO TABS
15.0000 mg | ORAL_TABLET | ORAL | Status: DC | PRN
Start: 2015-10-01 — End: 2015-10-03
  Administered 2015-10-01 – 2015-10-02 (×5): 20 mg via ORAL
  Administered 2015-10-03: 5 mg via ORAL
  Administered 2015-10-03 (×2): 15 mg via ORAL
  Filled 2015-10-01 (×4): qty 4
  Filled 2015-10-01: qty 3
  Filled 2015-10-01 (×2): qty 4
  Filled 2015-10-01: qty 3

## 2015-10-01 MED ORDER — DARBEPOETIN ALFA 100 MCG/0.5ML IJ SOSY
100.0000 ug | PREFILLED_SYRINGE | Freq: Once | INTRAMUSCULAR | Status: AC
  Administered 2015-10-01: 100 ug via SUBCUTANEOUS
  Filled 2015-10-01: qty 0.5

## 2015-10-01 MED ORDER — POLYETHYLENE GLYCOL 3350 17 G PO PACK
17.0000 g | PACK | Freq: Two times a day (BID) | ORAL | Status: DC
  Administered 2015-10-01 – 2015-10-03 (×5): 17 g via ORAL
  Filled 2015-10-01 (×5): qty 1

## 2015-10-01 MED ORDER — OXYCODONE HCL 5 MG PO TABS: 15.0000 mg | ORAL_TABLET | ORAL | Status: DC | PRN

## 2015-10-01 MED ORDER — MORPHINE SULFATE (PF) 2 MG/ML IV SOLN
2.0000 mg | INTRAVENOUS | Status: DC | PRN
  Administered 2015-10-01 – 2015-10-03 (×6): 2 mg via INTRAVENOUS
  Filled 2015-10-01 (×6): qty 1

## 2015-10-01 MED ORDER — SODIUM CHLORIDE 0.9 % IV SOLN
25.0000 mg | Freq: Once | INTRAVENOUS | Status: AC
  Administered 2015-10-01: 25 mg via INTRAVENOUS
  Filled 2015-10-01: qty 0.5

## 2015-10-01 MED ORDER — LIDOCAINE HCL 1 % IJ SOLN
INTRAMUSCULAR | Status: AC
  Filled 2015-10-01: qty 20

## 2015-10-01 MED ORDER — ACETAMINOPHEN 325 MG PO TABS
650.0000 mg | ORAL_TABLET | Freq: Once | ORAL | Status: AC
  Administered 2015-10-01: 650 mg via ORAL
  Filled 2015-10-01: qty 2

## 2015-10-01 MED ORDER — ASPIRIN EC 81 MG PO TBEC
81.0000 mg | DELAYED_RELEASE_TABLET | ORAL | Status: DC
  Administered 2015-10-02 – 2015-10-03 (×2): 81 mg via ORAL
  Filled 2015-10-01 (×2): qty 1

## 2015-10-01 NOTE — Progress Notes (Addendum)
PROGRESS NOTE        PATIENT DETAILS Name: Tina Watkins Age: 50 y.o. Sex: female Date of Birth: 12/02/1965 Admit Date: 09/29/2015 Admitting Physician Reubin Milan, MD [id number] A Coladonato, MD  Brief Narrative: Patient is a 50 y.o. female with past medical history of stage IV chronic kidney disease, renal transplantation on immunosuppressives who presented to the hospital with 1 week history of chest pain, subjective fever. She was seen in the emergency room on 8/23 for similar complaints, a VQ scan was read as low probability, she was given Omnicef-patient declined admission then, as she had a planned trip to [place]. She unfortunately continued to have chest pain, no longer having any fevers, she returned back from [place] on 8/27- and came straight to the emergency room where she was admitted to the hospitalist service. Further evaluation with a CT chest shows PNA.   Subjective: Continues to have left-sided chest pain-pain is still pleuritic and reproducible. Willing to get PRBC today  Assessment/Plan: Principal Problem: Chest pain: likely 2/2 PNA. VQ scan on 8/24 was read as low probability, Lower Ext Dopples on 8/28 negative for DVT.EKG/troponins are negative.Continue supportive care.  Active Problems: CAP with ?parapneumonic vs empyema: continue IV Rocephin/Zithromax-have ordered a thoracentesis today. Will follow. Will need repeat CT Chest in 4-6 weeks to see if consolidation has resolved. Urine strep neg, Urine legionella/Blood cultures pending  Acute on chronic kidney disease stage IV: Creatinine downtrending with supportive measures.. Continue to follow electrolytes, avoid nephrotoxic agents.  Hyponatremia: Improved with gentle hydration-Suspect related to volume loss from fever/dehydration-.  History of renal transplant: Continue immunosuppressives-azathioprine/Prograf/prednisone. Await nephrology evaluation  Anemia: Has chronic  anemia-likely secondary to renal disease at baseline, has had drop in hemoglobin-I suspect this is secondary to IV fluid dilution/acute illness. She denies any history of hematochezia, melena or hematemesis-and has not had a BM in the past few days. She is now agreeable to be transfused, will go ahead and transfuse her 1 unit.   Defer to nephrology regarding initiation of Aranesp/IV Iron.  Borderline hypotension: Appears asymptomatic-renal function improving- follow for now  History of gastroparesis: No vomiting-continue Reglan  GERD: Continue PPI  Chronic pain disorder: Continue narcotics-may need dose adjustment if blood pressure continues to be soft.  Constipation:continue Miralax  Anxiety: Continue as needed Xanax  DVT Prophylaxis: Prophylactic Lovenox   Code Status: Full code  Family Communication: None at bedside  Disposition Plan: Remain inpatient-home in 2-3 days  Antimicrobial agents: Omnicef 8/24>>8/28 Rocephin 8/28>> Zithromax 8/28>>  Procedures: None  CONSULTS:  nephrology  Time spent: 25 minutes-Greater than 50% of this time was spent in counseling, explanation of diagnosis, planning of further management, and coordination of care.  MEDICATIONS: Anti-infectives    Start     Dose/Rate Route Frequency Ordered Stop   09/30/15 1700  cefTRIAXone (ROCEPHIN) 1 g in dextrose 5 % 50 mL IVPB     1 g 100 mL/hr over 30 Minutes Intravenous Every 24 hours 09/30/15 1621     09/30/15 1700  azithromycin (ZITHROMAX) 500 mg in dextrose 5 % 250 mL IVPB     500 mg 250 mL/hr over 60 Minutes Intravenous Every 24 hours 09/30/15 1621     09/30/15 1000  cefUROXime (CEFTIN) tablet 500 mg  Status:  Discontinued     500 mg Oral Daily 09/29/15 2318 09/30/15 1123  Scheduled Meds: . acetaminophen  650 mg Oral Once  . aspirin EC  81 mg Oral BH-q7a  . azaTHIOprine  100 mg Oral Daily  . azithromycin  500 mg Intravenous Q24H  . cefTRIAXone (ROCEPHIN)  IV  1 g Intravenous Q24H    . diphenhydrAMINE (BENADRYL) IVPB(SICKLE CELL ONLY)  25 mg Intravenous Once  . [START ON 10/02/2015] enoxaparin (LOVENOX) injection  30 mg Subcutaneous Q24H  . metoCLOPramide  5 mg Oral BID  . pantoprazole  40 mg Oral Daily  . polyethylene glycol  17 g Oral BID  . predniSONE  5 mg Oral QAC breakfast  . sodium chloride flush  3 mL Intravenous Q12H  . tacrolimus  3 mg Oral BID   Continuous Infusions:  PRN Meds:.acetaminophen, ALPRAZolam, colchicine, diphenhydrAMINE, morphine injection, ondansetron (ZOFRAN) IV, oxyCODONE, promethazine, zolpidem   PHYSICAL EXAM: Vital signs: Vitals:   09/30/15 0951 09/30/15 1553 09/30/15 1900 10/01/15 0350  BP: (!) 85/35 (!) 90/42 (!) 91/37 93/60  Pulse: 67 67 66 (!) 59  Resp: 20 18 18 18   Temp: 99 F (37.2 C) 99.7 F (37.6 C) 98.9 F (37.2 C) 98.9 F (37.2 C)  TempSrc: Oral Oral Oral Oral  SpO2: 96% 100% 100% 99%  Weight:    52.5 kg (115 lb 12.8 oz)  Height:       Filed Weights   09/29/15 2310 09/30/15 0432 10/01/15 0350  Weight: 51.4 kg (113 lb 6.4 oz) 51.5 kg (113 lb 8 oz) 52.5 kg (115 lb 12.8 oz)   Body mass index is 22.62 kg/m.   Gen Exam: Awake and alert with clear speech. Not in any distress.Appears frail and chronically sick appearing Neck: Supple, No JVD.   Chest: B/L Clear.   CVS: S1 S2 Regular, no murmurs.  Abdomen: soft, BS +, non tender, non distended.  Extremities: no edema, lower extremities warm to touch. Neurologic: Non Focal.   Skin: No Rash or lesions   Wounds: N/A.   I have personally reviewed following labs and imaging studies  LABORATORY DATA: CBC:  Recent Labs Lab 09/25/15 1943 09/29/15 1648 09/30/15 0518 09/30/15 1220 10/01/15 0348  WBC 4.7 5.6 3.8* 4.6 3.4*  HGB 9.3* 7.8* 6.9* 7.1* 6.6*  HCT 29.4* 24.0* 21.4* 22.3* 19.9*  MCV 96.7 94.9 94.3 94.1 93.4  PLT 162 206 198 213 [id number]    Basic Metabolic Panel:  Recent Labs Lab 09/25/15 1943 09/29/15 1648 09/30/15 0518 10/01/15 0348  NA 137 126*  134* 133*  K 4.3 4.2 4.4 4.7  CL 104 96* 102 101  CO2 25 21* 22 22  GLUCOSE 99 148* 100* 79  BUN 30* 46* 46* 46*  CREATININE 2.56* 3.41* 3.24* 2.74*  CALCIUM 9.7 9.0 8.9 9.1  PHOS  --   --   --  4.0    GFR: Estimated Creatinine Clearance: 18 mL/min (by C-G formula based on SCr of 2.74 mg/dL).  Liver Function Tests:  Recent Labs Lab 09/30/15 0518 10/01/15 0348  AST 20  --   ALT 19  --   ALKPHOS 74  --   BILITOT 0.7  --   PROT 5.9*  --   ALBUMIN 3.0* 2.9*   No results for input(s): LIPASE, AMYLASE in the last 168 hours. No results for input(s): AMMONIA in the last 168 hours.  Coagulation Profile: No results for input(s): INR, PROTIME in the last 168 hours.  Cardiac Enzymes:  Recent Labs Lab 09/29/15 2326 09/30/15 0518  TROPONINI <0.03 <0.03    BNP (last  3 results) No results for input(s): PROBNP in the last 8760 hours.  HbA1C: No results for input(s): HGBA1C in the last 72 hours.  CBG: No results for input(s): GLUCAP in the last 168 hours.  Lipid Profile: No results for input(s): CHOL, HDL, LDLCALC, TRIG, CHOLHDL, LDLDIRECT in the last 72 hours.  Thyroid Function Tests: No results for input(s): TSH, T4TOTAL, FREET4, T3FREE, THYROIDAB in the last 72 hours.  Anemia Panel:  Recent Labs  09/30/15 1843  FERRITIN 2,503*  TIBC 126*  IRON 18*    Urine analysis:    Component Value Date/Time   COLORURINE AMBER (A) 09/29/2015 1940   APPEARANCEUR CLOUDY (A) 09/29/2015 1940   LABSPEC 1.022 09/29/2015 1940   PHURINE 5.0 09/29/2015 1940   GLUCOSEU NEGATIVE 09/29/2015 1940   HGBUR NEGATIVE 09/29/2015 1940   BILIRUBINUR SMALL (A) 09/29/2015 1940   KETONESUR 15 (A) 09/29/2015 1940   PROTEINUR 30 (A) 09/29/2015 1940   UROBILINOGEN 0.2 10/15/2014 0946   NITRITE NEGATIVE 09/29/2015 1940   LEUKOCYTESUR TRACE (A) 09/29/2015 1940    Sepsis Labs: Lactic Acid, Venous    Component Value Date/Time   LATICACIDVEN 0.82 09/25/2015 2357    MICROBIOLOGY: No  results found for this or any previous visit (from the past 240 hour(s)).  RADIOLOGY STUDIES/RESULTS: Dg Chest 2 View  Result Date: 09/29/2015 CLINICAL DATA:  Chest pain and shortness of Breath EXAM: CHEST  2 VIEW COMPARISON:  09/25/2015 FINDINGS: Moderate cardiac enlargement. There is a left pleural effusion which appears increased from previous exam. Overlying atelectasis noted. Pulmonary vascular congestion is noted. Right lung appears clear. Stent is noted within the superior vena cava. IMPRESSION: 1. Increase in volume of left pleural effusion. Electronically Signed   By: Kerby Moors M.D.   On: 09/29/2015 17:39   Dg Chest 2 View  Result Date: 09/25/2015 CLINICAL DATA:  LEFT chest pain and dyspnea beginning this morning. Tachycardia. History of stent. EXAM: CHEST  2 VIEW COMPARISON:  Chest radiograph Jun 17, 2015 FINDINGS: Cardiac silhouette is mildly enlarged and unchanged. Stent projects in RIGHT mediastinum. Mild similar bronchitic changes in pulmonary vascular congestion without pleural effusion or focal consolidation. Mildly elevated RIGHT hemidiaphragm. No pneumothorax. ACDF. Surgical clips in the abdomen. IMPRESSION: Mild cardiomegaly. Similar bronchitic changes and mild pulmonary vascular congestion. Electronically Signed   By: Elon Alas M.D.   On: 09/25/2015 20:56   Ct Chest Wo Contrast  Result Date: 09/30/2015 CLINICAL DATA:  Chest pain and difficulty breathing for 5 days. EXAM: CT CHEST WITHOUT CONTRAST TECHNIQUE: Multidetector CT imaging of the chest was performed following the standard protocol without IV contrast. COMPARISON:  CT chest from 9/20 2/6 FINDINGS: Cardiovascular: There is mild cardiac enlargement. Aortic atherosclerosis noted. Calcification within the RCA coronary artery noted. There is a metallic stent identified within the superior vena cava. Increase in size of the azygos and hemi azygous veins with multiple posterior mediastinal collateral vessels. There is  also extensive chest wall collateral vessels. Mediastinum/Nodes: The trachea appears patent and is midline. Normal appearance of the esophagus. Multiple calcified and noncalcified lymph nodes are identified. Within the limitations of noncontrast technique no definite evidence for mediastinal or hilar adenopathy. Lungs/Pleura: There is a loculated pleural effusion overlying the left hemidiaphragm which measures approximately 6.9 x 3.8 cm. Overlying airspace consolidation is identified. Masslike consolidation within the left upper lobe measures 4.2 cm, image number 12 of series 205. Subsegmental atelectasis is noted in the posterior right base and right middle lobe. Upper Abdomen: The visualized portions  of the liver and spleen are unremarkable. Surgical clips are identified within the retroperitoneum of the upper abdomen. Musculoskeletal: No aggressive lytic or sclerotic bone lesions identified. IMPRESSION: 1. There is a loculated pleural effusion identified in the left lung base with overlying airspace consolidation in atelectasis. Cannot rule out empyema. 2. Masslike consolidation in the left upper lobe is noted. In the acute setting this is favored to represent pneumonia. This will need follow-up imaging to ensure resolution. If this does not resolve then further investigation with PET-CT and tissue sampling would be advised. 3. There is a metallic stent within the SVC. 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 4. Prior granulomatous disease 5. Aortic atherosclerosis and  coronary artery calcifications. Electronically Signed   By: Kerby Moors M.D.   On: 09/30/2015 15:02   Nm Pulmonary Perf And Vent  Result Date: 09/26/2015 CLINICAL DATA:  Left-sided pleuritic chest pain. EXAM: NUCLEAR MEDICINE VENTILATION - PERFUSION LUNG SCAN TECHNIQUE: Ventilation images were obtained in multiple projections using inhaled aerosol Tc-30m  DTPA. Perfusion images were obtained in multiple projections after intravenous injection of Tc-52m MAA. RADIOPHARMACEUTICALS:  31.2 mCi Technetium-53m DTPA aerosol inhalation and 4.06 mCi Technetium-54m MAA IV COMPARISON:  Chest radiograph yesterday. FINDINGS: Ventilation: No focal ventilation defect. Perfusion: No wedge shaped peripheral perfusion defects to suggest acute pulmonary embolism. Liver activity is seen on perfusion imaging likely due to trans portal venous collaterals, multiple collaterals noted on CT abdomen/pelvis 06/13/2014. IMPRESSION: Low probability for pulmonary embolus. Electronically Signed   By: Jeb Levering M.D.   On: 09/26/2015 04:44   Mm Digital Screening Bilateral  Result Date: 09/02/2015 CLINICAL DATA:  Screening. EXAM: DIGITAL SCREENING BILATERAL MAMMOGRAM WITH CAD COMPARISON:  Previous exam(s). ACR Breast Density Category b: There are scattered areas of fibroglandular density. FINDINGS: There are no findings suspicious for malignancy. Images were processed with CAD. IMPRESSION: No mammographic evidence of malignancy. A result letter of this screening mammogram will be mailed directly to the patient. RECOMMENDATION: Screening mammogram in one year. (Code:SM-B-01Y) BI-RADS CATEGORY  1: Negative. Electronically Signed   By: Curlene Dolphin M.D.   On: 09/02/2015 15:41     LOS: 1 day   Oren Binet, MD  [facility] Pager:[phone]  If 7PM-7AM, please contact night-coverage [email or web address] Password [email or web address] 10/01/2015, 9:15 AM

## 2015-10-01 NOTE — Progress Notes (Signed)
  Echocardiogram 2D Echocardiogram has been performed.  Tina Watkins 10/01/2015, 3:40 PM

## 2015-10-01 NOTE — Progress Notes (Signed)
During bedside report patient expressed that she wants to be left alone to sleep until 10am medications. Will do daily assessment at that time also. Nurse and tech's name and number left on patient board, told to call if she needs anything.   Celestia Khat, RN

## 2015-10-01 NOTE — Progress Notes (Signed)
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  She appears to be a chronically ill individual with multiple comorbidities.  She has a worsening anemia in the absence of blood loss, complicating matters as she is on her third kidney transplant.  Recommendations as follows:  1.  Acute kidney injury: Baseline creatinine is labile and I believe related to volume status. Continues to improve.  Do not believe this AKI represents acute rejection.  Agree with gentle IVFs espeically as pressures soft.   2.  s/p living related donor kidney transplantation 2005: secondary to chronic GN .  This is her third transplant as as such she is a highly sensitized individual.  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  Continue azathioprine 100 mg daily, prograf 3 mg BID, and prednisone 5 mg daily.  will follow up 12-hour prograf trough (ordered for 0900 8/29).  Do not think reduction in immunosuppression is needed; would consider reducing AZA if pt becomes critically ill.  Pt would also need stress-dose steroids if this should happen as well.  3.  Hyponatremia: likely due to volume depletion, improving with IVF.  4.  Anemia: Last outpatient Hgb 08/2015 9.9; hemoglobin has dropped to 6.9-->7.1--> 6.6 without overt acute blood loss.  will transfuse 1 u pRBCs 8/29 and will give Aranesp 8/29 as well.  5. Multifocal pneumonia: CT chest with L-sided PNA and L loculated pleural effusion. This is the source of CP.  Thora pending.  On zithromax and CTX, low threshold to broaden.    Subjective:    CT chest demonstrates multifocal PNA with L loculated pleural effusion, concerning for empyema.  Hgb down to 6.6.  Still having pleuritic chest pain, pressures soft.   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  + collateral vessels underneath the skin on abd especially  Imaging: Dg Chest 2 View  Result Date: 09/29/2015 CLINICAL DATA:  Chest pain and shortness of Breath EXAM: CHEST  2 VIEW COMPARISON:  09/25/2015 FINDINGS: Moderate cardiac enlargement. There is a left pleural effusion which appears increased from previous exam. Overlying atelectasis noted. Pulmonary vascular congestion is noted. Right lung appears  clear. Stent is noted within the superior vena cava. IMPRESSION: 1. Increase in volume of left pleural effusion. Electronically Signed   By: Kerby Moors M.D.   On: 09/29/2015 17:39   Ct Chest Wo Contrast  Result Date: 09/30/2015 CLINICAL DATA:  Chest pain and difficulty breathing for 5 days. EXAM: CT CHEST WITHOUT CONTRAST TECHNIQUE: Multidetector CT imaging of the chest was performed following the standard protocol without IV contrast. COMPARISON:  CT chest from 9/20 2/6 FINDINGS: Cardiovascular: There is mild cardiac enlargement. Aortic atherosclerosis noted. Calcification within the RCA coronary artery noted. There is a metallic stent identified within the superior vena cava. Increase in  size of the azygos and hemi azygous veins with multiple posterior mediastinal collateral vessels. There is also extensive chest wall collateral vessels. Mediastinum/Nodes: The trachea appears patent and is midline. Normal appearance of the esophagus. Multiple calcified and noncalcified lymph nodes are identified. Within the limitations of noncontrast technique no definite evidence for mediastinal or hilar adenopathy. Lungs/Pleura: There is a loculated pleural effusion overlying the left hemidiaphragm which measures approximately 6.9 x 3.8 cm. Overlying airspace consolidation is identified. Masslike consolidation within the left upper lobe measures 4.2 cm, image number 12 of series 205. Subsegmental atelectasis is noted in the posterior right base and right middle lobe. Upper Abdomen: The visualized portions of the liver and spleen are unremarkable. Surgical clips are identified within the retroperitoneum of the upper abdomen. Musculoskeletal: No aggressive lytic or sclerotic bone lesions identified. IMPRESSION: 1. There is a loculated pleural effusion identified in the left lung base with overlying airspace consolidation in atelectasis. Cannot rule out empyema. 2. Masslike consolidation in the left upper lobe is noted. In the acute setting this is favored to represent pneumonia. This will need follow-up imaging to ensure resolution. If this does not resolve then further investigation with PET-CT and tissue sampling would be advised. 3. There is a metallic stent within the SVC. 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 4. Prior granulomatous disease 5. Aortic atherosclerosis and  coronary artery calcifications. Electronically Signed   By: Kerby Moors M.D.   On: 09/30/2015 15:02    Labs: BMET  Recent Labs Lab 09/25/15 1943 09/29/15 1648 09/30/15 0518 10/01/15 0348  NA 137 126* 134* 133*  K 4.3 4.2 4.4 4.7  CL 104  96* 102 101  CO2 25 21* 22 22  GLUCOSE 99 148* 100* 79  BUN 30* 46* 46* 46*  CREATININE 2.56* 3.41* 3.24* 2.74*  CALCIUM 9.7 9.0 8.9 9.1  PHOS  --   --   --  4.0   CBC  Recent Labs Lab 09/29/15 1648 09/30/15 0518 09/30/15 1220 10/01/15 0348  WBC 5.6 3.8* 4.6 3.4*  HGB 7.8* 6.9* 7.1* 6.6*  HCT 24.0* 21.4* 22.3* 19.9*  MCV 94.9 94.3 94.1 93.4  PLT 206 198 213 220    Medications:    . aspirin EC  81 mg Oral BH-q7a  . azaTHIOprine  100 mg Oral Daily  . azithromycin  500 mg Intravenous Q24H  . cefTRIAXone (ROCEPHIN)  IV  1 g Intravenous Q24H  . diphenhydrAMINE (BENADRYL) IVPB(SICKLE CELL ONLY)  25 mg Intravenous Once  . [START ON 10/02/2015] enoxaparin (LOVENOX) injection  30 mg Subcutaneous Q24H  . metoCLOPramide  5 mg Oral BID  . pantoprazole  40 mg Oral Daily  . polyethylene glycol  17 g Oral BID  . predniSONE  5 mg Oral QAC breakfast  .  sodium chloride flush  3 mL Intravenous Q12H  . tacrolimus  3 mg Oral BID      Madelon Lips, MD [facility]  pgr [phone] cell [phone] 10/01/2015, 9:45 AM

## 2015-10-01 NOTE — Progress Notes (Addendum)
CRITICAL VALUE ALERT  Critical value received:  Hgb 6.6  Date of notification:  10/01/2015  Time of notification:  0428  Critical value read back:Yes.    Nurse who received alert:  Kendrick [place]  MD notified (1st page):  Tylene Fantasia  Time of first page:  0428  MD notified (2nd page):  Time of second page:  Responding MD:  Tylene Fantasia  Time MD responded:  [phone]

## 2015-10-01 NOTE — Progress Notes (Signed)
Patient ID: Tina Watkins, female   DOB: 05-26-1965, 50 y.o.   MRN: [id number] Not enough fluid to safely perform thoracentesis without risk of injury to her lung. D/w ordering provider  Kjuan Seipp E

## 2015-10-02 LAB — TYPE AND SCREEN
ABO/RH(D): B POS
Antibody Screen: NEGATIVE
Unit division: 0

## 2015-10-02 LAB — RENAL FUNCTION PANEL
Albumin: 3.1 g/dL — ABNORMAL LOW (ref 3.5–5.0)
Anion gap: 13 (ref 5–15)
BUN: 49 mg/dL — ABNORMAL HIGH (ref 6–20)
CO2: 21 mmol/L — ABNORMAL LOW (ref 22–32)
Calcium: 9.3 mg/dL (ref 8.9–10.3)
Chloride: 99 mmol/L — ABNORMAL LOW (ref 101–111)
Creatinine, Ser: 3.16 mg/dL — ABNORMAL HIGH (ref 0.44–1.00)
GFR calc Af Amer: 19 mL/min — ABNORMAL LOW (ref >60)
GFR calc non Af Amer: 16 mL/min — ABNORMAL LOW (ref >60)
Glucose, Bld: 87 mg/dL (ref 65–99)
Phosphorus: 4 mg/dL (ref 2.5–4.6)
Potassium: 4.1 mmol/L (ref 3.5–5.1)
Sodium: 133 mmol/L — ABNORMAL LOW (ref 135–145)

## 2015-10-02 LAB — CBC
HCT: 25.9 % — ABNORMAL LOW (ref 36.0–46.0)
Hemoglobin: 8.4 g/dL — ABNORMAL LOW (ref 12.0–15.0)
MCH: 30 pg (ref 26.0–34.0)
MCHC: 32.4 g/dL (ref 30.0–36.0)
MCV: 92.5 fL (ref 78.0–100.0)
Platelets: 253 10*3/uL (ref 150–400)
RBC: 2.8 MIL/uL — ABNORMAL LOW (ref 3.87–5.11)
RDW: 19.9 % — ABNORMAL HIGH (ref 11.5–15.5)
WBC: 4.5 10*3/uL (ref 4.0–10.5)

## 2015-10-02 LAB — LEGIONELLA PNEUMOPHILA SEROGP 1 UR AG: L. pneumophila Serogp 1 Ur Ag: NEGATIVE

## 2015-10-02 MED ORDER — ENOXAPARIN SODIUM 30 MG/0.3ML ~~LOC~~ SOLN: 30.0000 mg | SUBCUTANEOUS | Status: DC

## 2015-10-02 MED ORDER — ENOXAPARIN SODIUM 30 MG/0.3ML ~~LOC~~ SOLN
30.0000 mg | SUBCUTANEOUS | Status: DC
Start: 2015-10-02 — End: 2015-10-03
  Administered 2015-10-02: 30 mg via SUBCUTANEOUS
  Filled 2015-10-02: qty 0.3

## 2015-10-02 MED ORDER — AZITHROMYCIN 500 MG PO TABS
500.0000 mg | ORAL_TABLET | Freq: Every day | ORAL | Status: DC
  Administered 2015-10-02: 500 mg via ORAL
  Filled 2015-10-02: qty 1

## 2015-10-02 NOTE — Progress Notes (Signed)
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  She appears to be a chronically ill individual with multiple comorbidities.  She has a worsening anemia in the absence of blood loss, complicating matters as she is on her third kidney transplant.  Recommendations as follows:  1.  Acute kidney injury: Baseline creatinine is labile and I believe related to volume status.  Do not believe this AKI represents acute rejection. Will liberalize fluid restriction.  If relatively hypotensive again would give 500cc bolus as I think she's still a little dry.      2.  s/p living related donor kidney transplantation 2005: secondary to chronic GN .  This is her third transplant as as such she is a highly sensitized individual.  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  Continue azathioprine 100 mg daily, prograf 3 mg BID, and prednisone 5 mg daily.  will follow up 12-hour prograf trough from 8/29; appears to be collected at ~1400 which would be after her first dose of AM tac so the level likely won't be accurate, will re-order for 8/31 AM.  CNIs such as tacrolimus can interact with macrolide antibiotics; more clarithromycin and erythromycin, but azithromycin can potentiate level of tac too so will be important to see accurate trough.  3.  Hyponatremia: likely due to volume depletion, improving with IVF.  Fluid recs as above in #1  4.  Anemia: Last outpatient Hgb 08/2015 9.9; hemoglobin nadired here at 6.6.  S/p 1 u pRBCs 8/29 and Aranesp 100 mcg 8/29 as well.  5. Multifocal pneumonia: continuing azithro and CTX (start date 8/28).  defervescing and CP is better.  Inocencio Homes was attempted yesterday, not enough fluid to tap.  She will certainly need followup chest CT in 3-4 weeks to ensure resolution.   Would have a low threshold to re-image in house especially if becomes febrile again or has an increasing leukocytosis/ signs of clinical deterioration.  If effusion reaccumulates, repeat thora +/- pigtail drain (if exudative) may be necessary.  Subjective:    Got blood transfusion yesterday with Hgb up to 8.4.  Also received Aranesp.  Went down for thora and only trace free-flowing fluid was seen on ultrasound so it was not performed.  Afebrile, chest pain is better.  Still has some soft pressures and creatinine has risen again.   Objective:   BP (!) 92/52 (BP Location: Left Arm)   Pulse 64   Temp 98.1 F (36.7 C) (Oral)   Resp 16   Ht 5' (1.524 m)   Wt 52 kg (114 lb 9.6 oz) Comment: scale a  SpO2 95%   BMI 22.38 kg/m   Intake/Output Summary (Last 24 hours) at 10/02/15 1247 Last data filed at 10/02/15 0933  Gross per 24 hour  Intake          1229.25 ml  Output              300 ml  Net           929.25 ml   Weight change: -0.544 kg (-1 lb 3.2 oz)  Physical Exam: GEN: chronically ill appearing but NAD.  Sitting up and eating lunch HEENT: R eye partially closed and blind, L eye EOMI, PERRL NECK: flat neck veins PULM: normal WOB, some muffled  breath sounds on the L CV RRR no m/r/g ABD: soft, NABS EXT: no LE edema NEURO: AAO x 3 SKIN: hyperpigmented firm areas consistent with known diagnosis of NSF bilateral legs and thighs.  + collateral vessels underneath the skin on abd especially  Imaging: Ct Chest Wo Contrast  Result Date: 09/30/2015 CLINICAL DATA:  Chest pain and difficulty breathing for 5 days. EXAM: CT CHEST WITHOUT CONTRAST TECHNIQUE: Multidetector CT imaging of the chest was performed following the standard protocol without IV contrast. COMPARISON:  CT chest from 9/20 2/6 FINDINGS: Cardiovascular: There is mild cardiac enlargement. Aortic atherosclerosis noted. Calcification within the RCA coronary artery noted. There is a metallic stent identified within the superior  vena cava. Increase in size of the azygos and hemi azygous veins with multiple posterior mediastinal collateral vessels. There is also extensive chest wall collateral vessels. Mediastinum/Nodes: The trachea appears patent and is midline. Normal appearance of the esophagus. Multiple calcified and noncalcified lymph nodes are identified. Within the limitations of noncontrast technique no definite evidence for mediastinal or hilar adenopathy. Lungs/Pleura: There is a loculated pleural effusion overlying the left hemidiaphragm which measures approximately 6.9 x 3.8 cm. Overlying airspace consolidation is identified. Masslike consolidation within the left upper lobe measures 4.2 cm, image number 12 of series 205. Subsegmental atelectasis is noted in the posterior right base and right middle lobe. Upper Abdomen: The visualized portions of the liver and spleen are unremarkable. Surgical clips are identified within the retroperitoneum of the upper abdomen. Musculoskeletal: No aggressive lytic or sclerotic bone lesions identified. IMPRESSION: 1. There is a loculated pleural effusion identified in the left lung base with overlying airspace consolidation in atelectasis. Cannot rule out empyema. 2. Masslike consolidation in the left upper lobe is noted. In the acute setting this is favored to represent pneumonia. This will need follow-up imaging to ensure resolution. If this does not resolve then further investigation with PET-CT and tissue sampling would be advised. 3. There is a metallic stent within the SVC. 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 4. Prior granulomatous disease 5. Aortic atherosclerosis and  coronary artery calcifications. Electronically Signed   By: Kerby Moors M.D.   On: 09/30/2015 15:02   [name] Chest  Result Date: 10/01/2015 CLINICAL DATA:  Patient with left-sided chest pain with a recent CT scan suggesting a  loculated left-sided pleural effusion. Request is made for diagnostic and therapeutic thoracentesis if able. EXAM: CHEST ULTRASOUND COMPARISON:  None. FINDINGS: Trace amount of simple anechoic fluid noted in the left chest. No evidence of loculations. IMPRESSION: Trace amount of simple appearing left-sided pleural fluid, not enough to allow for safe ultrasound-guided paracentesis. Read by: Saverio Danker, PA-C Electronically Signed   By: Sandi Mariscal M.D.   On: 10/01/2015 11:08    Labs: BMET  Recent Labs Lab 09/25/15 1943 09/29/15 1648 09/30/15 0518 10/01/15 0348 10/02/15 0311  NA 137 126* 134* 133* 133*  K 4.3 4.2 4.4 4.7 4.1  CL 104 96* 102 101 99*  CO2 25 21* 22 22 21*  GLUCOSE 99 148* 100* 79 87  BUN 30* 46* 46* 46* 49*  CREATININE 2.56* 3.41* 3.24* 2.74* 3.16*  CALCIUM 9.7 9.0 8.9 9.1 9.3  PHOS  --   --   --  4.0 4.0   CBC  Recent Labs Lab 09/30/15 0518 09/30/15 1220 10/01/15 0348 10/01/15 2035 10/02/15 0315  WBC 3.8* 4.6 3.4*  --  4.5  HGB 6.9*  7.1* 6.6* 8.4* 8.4*  HCT 21.4* 22.3* 19.9* 26.4* 25.9*  MCV 94.3 94.1 93.4  --  92.5  PLT 198 213 220  --  253    Medications:    . aspirin EC  81 mg Oral BH-q7a  . azaTHIOprine  100 mg Oral Daily  . azithromycin  500 mg Oral Daily  . cefTRIAXone (ROCEPHIN)  IV  1 g Intravenous Q24H  . enoxaparin (LOVENOX) injection  30 mg Subcutaneous Q24H  . metoCLOPramide  5 mg Oral BID  . pantoprazole  40 mg Oral Daily  . polyethylene glycol  17 g Oral BID  . predniSONE  5 mg Oral QAC breakfast  . sodium chloride flush  3 mL Intravenous Q12H  . tacrolimus  3 mg Oral BID      Madelon Lips, MD [place]  pgr [phone] cell [phone] 10/02/2015, 12:47 PM

## 2015-10-02 NOTE — Progress Notes (Signed)
PROGRESS NOTE        PATIENT DETAILS Name: Tina Watkins Age: 50 y.o. Sex: female Date of Birth: Jun 04, 1965 Admit Date: 09/29/2015 Admitting Physician Reubin Milan, MD [id number] Coladonato, MD  Brief Narrative: Patient is a 50 y.o. female with past medical history of stage IV chronic kidney disease, renal transplantation on immunosuppressives who presented to the hospital with 1 week history of chest pain, subjective fever. She was seen in the emergency room on 8/23 for similar complaints, a VQ scan was read as low probability, she was given Omnicef-patient declined admission then, as she had a planned trip to [place]. She unfortunately continued to have chest pain, no longer having any fevers, she returned back from [place] on 8/27- and came straight to the emergency room where she was admitted to the hospitalist service. Further evaluation with a CT chest shows PNA.   Subjective: She is upset as she lost her car and home keys. But claims that left-sided chest pain is much better.  Assessment/Plan: Principal Problem: Chest pain: likely 2/2 PNA. VQ scan on 8/24 was read as low probability, Lower Ext Dopples on 8/28 negative for DVT.EKG/troponins are negative.Continue supportive care.  Active Problems: Community-acquired pneumonia: continue IV Rocephin/Zithromax-have ordered a thoracentesis today. Even though effusion was seen on CT scan-no significant pleural fluid seen on ultrasound chest to warrant thoracocentesis. Please note, only trace amount of free flowing fluid was seen. Suspect, she will need empiric antibiotics at this time, and a repeat CT scan in one month to see if consolidation has resolved, if consolidation is still present on repeat CT chest, she will need a PET scan. Blood cultures on 8/28 negative so far (patient was already on several days of antibiotics prior to this admission) urine streptococcal antigen is negative, Legionella antigen  is pending.  Acute on chronic kidney disease stage IV: Creatinine labile-but suspect within her usual range... Continue to follow electrolytes, avoid nephrotoxic agents. Nephrology following.  Hyponatremia: Improved with gentle hydration-Suspect related to volume loss from fever/dehydration-.  History of renal transplant: Continue immunosuppressives-azathioprine/Prograf/prednisone. Await nephrology evaluation  Anemia: Has chronic anemia-likely secondary to renal disease at baseline, has had drop in hemoglobin-I suspect this is secondary to IV fluid dilution/acute illness. She was initially reluctant to undergo PRBC transfusion, however did agree on 8/29. Hemoglobin stable at 8.4. She denies any history of hematochezia, melena or hematemesis.  Defer to nephrology regarding initiation of Aranesp/IV Iron.  Borderline hypotension: Appears asymptomatic-renal function stable- follow for now  History of gastroparesis: No vomiting-continue Reglan  GERD: Continue PPI  Chronic pain disorder: Continue narcotics-may need dose adjustment if blood pressure continues to be soft.  Constipation:continue Miralax  Anxiety: Continue as needed Xanax  Tobacco abuse: Counseled  DVT Prophylaxis: Prophylactic Lovenox   Code Status: Full code  Family Communication: None at bedside  Disposition Plan: Remain inpatient-home in 1-2 days  Antimicrobial agents: Omnicef 8/24>>8/28 Rocephin 8/28>> Zithromax 8/28>>  Procedures: None  CONSULTS:  nephrology  Time spent: 25 minutes-Greater than 50% of this time was spent in counseling, explanation of diagnosis, planning of further management, and coordination of care.  MEDICATIONS: Anti-infectives    Start     Dose/Rate Route Frequency Ordered Stop   10/02/15 1700  azithromycin (ZITHROMAX) tablet 500 mg     500 mg Oral Daily 10/02/15 1052     09/30/15 1700  cefTRIAXone (ROCEPHIN) 1 g in dextrose 5 % 50 mL IVPB     1 g 100 mL/hr over 30 Minutes  Intravenous Every 24 hours 09/30/15 1621     09/30/15 1700  azithromycin (ZITHROMAX) 500 mg in dextrose 5 % 250 mL IVPB  Status:  Discontinued     500 mg 250 mL/hr over 60 Minutes Intravenous Every 24 hours 09/30/15 1621 10/02/15 1052   09/30/15 1000  cefUROXime (CEFTIN) tablet 500 mg  Status:  Discontinued     500 mg Oral Daily 09/29/15 2318 09/30/15 1123      Scheduled Meds: . aspirin EC  81 mg Oral BH-q7a  . azaTHIOprine  100 mg Oral Daily  . azithromycin  500 mg Oral Daily  . cefTRIAXone (ROCEPHIN)  IV  1 g Intravenous Q24H  . enoxaparin (LOVENOX) injection  30 mg Subcutaneous Q24H  . metoCLOPramide  5 mg Oral BID  . pantoprazole  40 mg Oral Daily  . polyethylene glycol  17 g Oral BID  . predniSONE  5 mg Oral QAC breakfast  . sodium chloride flush  3 mL Intravenous Q12H  . tacrolimus  3 mg Oral BID   Continuous Infusions:  PRN Meds:.acetaminophen, ALPRAZolam, colchicine, diphenhydrAMINE, morphine injection, ondansetron (ZOFRAN) IV, oxyCODONE, promethazine, zolpidem   PHYSICAL EXAM: Vital signs: Vitals:   10/01/15 1156 10/01/15 1409 10/02/15 0641 10/02/15 1219  BP: (!) 90/51 (!) 103/59 (!) 91/59 (!) 92/52  Pulse: (!) 58 (!) 119 68 64  Resp: 18 18 14 16   Temp: 98.4 F (36.9 C) 98.2 F (36.8 C) 98.6 F (37 C) 98.1 F (36.7 C)  TempSrc: Oral Oral Oral Oral  SpO2: 100% 100% 100% 95%  Weight:   52 kg (114 lb 9.6 oz)   Height:       Filed Weights   09/30/15 0432 10/01/15 0350 10/02/15 0641  Weight: 51.5 kg (113 lb 8 oz) 52.5 kg (115 lb 12.8 oz) 52 kg (114 lb 9.6 oz)   Body mass index is 22.38 kg/m.   Gen Exam: Awake and alert with clear speech. Not in any distress.Appears frail and chronically sick appearing Neck: Supple, No JVD.   Chest: B/L Clear.   CVS: S1 S2 Regular, no murmurs.  Abdomen: soft, BS +, non tender, non distended.  Extremities: no edema, lower extremities warm to touch. Neurologic: Non Focal.   Skin: No Rash or lesions   Wounds: N/A.   I have  personally reviewed following labs and imaging studies  LABORATORY DATA: CBC:  Recent Labs Lab 09/29/15 1648 09/30/15 0518 09/30/15 1220 10/01/15 0348 10/01/15 2035 10/02/15 0315  WBC 5.6 3.8* 4.6 3.4*  --  4.5  HGB 7.8* 6.9* 7.1* 6.6* 8.4* 8.4*  HCT 24.0* 21.4* 22.3* 19.9* 26.4* 25.9*  MCV 94.9 94.3 94.1 93.4  --  92.5  PLT 206 198 213 220  --  123456    Basic Metabolic Panel:  Recent Labs Lab 09/25/15 1943 09/29/15 1648 09/30/15 0518 10/01/15 0348 10/02/15 0311  NA 137 126* 134* 133* 133*  K 4.3 4.2 4.4 4.7 4.1  CL 104 96* 102 101 99*  CO2 25 21* 22 22 21*  GLUCOSE 99 148* 100* 79 87  BUN 30* 46* 46* 46* 49*  CREATININE 2.56* 3.41* 3.24* 2.74* 3.16*  CALCIUM 9.7 9.0 8.9 9.1 9.3  PHOS  --   --   --  4.0 4.0    GFR: Estimated Creatinine Clearance: 15.6 mL/min (by C-G formula based on SCr of 3.16 mg/dL).  Liver Function Tests:  Recent Labs Lab 09/30/15 0518 10/01/15 0348 10/02/15 0311  AST 20  --   --   ALT 19  --   --   ALKPHOS 74  --   --   BILITOT 0.7  --   --   PROT 5.9*  --   --   ALBUMIN 3.0* 2.9* 3.1*   No results for input(s): LIPASE, AMYLASE in the last 168 hours. No results for input(s): AMMONIA in the last 168 hours.  Coagulation Profile: No results for input(s): INR, PROTIME in the last 168 hours.  Cardiac Enzymes:  Recent Labs Lab 09/29/15 2326 09/30/15 0518  TROPONINI <0.03 <0.03    BNP (last 3 results) No results for input(s): PROBNP in the last 8760 hours.  HbA1C: No results for input(s): HGBA1C in the last 72 hours.  CBG: No results for input(s): GLUCAP in the last 168 hours.  Lipid Profile: No results for input(s): CHOL, HDL, LDLCALC, TRIG, CHOLHDL, LDLDIRECT in the last 72 hours.  Thyroid Function Tests: No results for input(s): TSH, T4TOTAL, FREET4, T3FREE, THYROIDAB in the last 72 hours.  Anemia Panel:  Recent Labs  09/30/15 1843  FERRITIN 2,503*  TIBC 126*  IRON 18*    Urine analysis:    Component  Value Date/Time   COLORURINE AMBER (A) 09/29/2015 1940   APPEARANCEUR CLOUDY (A) 09/29/2015 1940   LABSPEC 1.022 09/29/2015 1940   PHURINE 5.0 09/29/2015 1940   GLUCOSEU NEGATIVE 09/29/2015 1940   HGBUR NEGATIVE 09/29/2015 1940   BILIRUBINUR SMALL (A) 09/29/2015 1940   KETONESUR 15 (A) 09/29/2015 1940   PROTEINUR 30 (A) 09/29/2015 1940   UROBILINOGEN 0.2 10/15/2014 0946   NITRITE NEGATIVE 09/29/2015 1940   LEUKOCYTESUR TRACE (A) 09/29/2015 1940    Sepsis Labs: Lactic Acid, Venous    Component Value Date/Time   LATICACIDVEN 0.82 09/25/2015 2357    MICROBIOLOGY: Recent Results (from the past 240 hour(s))  Culture, blood (routine x 2)     Status: None (Preliminary result)   Collection Time: 09/30/15  6:33 PM  Result Value Ref Range Status   Specimen Description BLOOD LEFT WRIST  Final   Special Requests BOTTLES DRAWN AEROBIC AND ANAEROBIC 5CC  Final   Culture NO GROWTH < 24 HOURS  Final   Report Status PENDING  Incomplete  Culture, blood (routine x 2)     Status: None (Preliminary result)   Collection Time: 09/30/15  6:43 PM  Result Value Ref Range Status   Specimen Description BLOOD RIGHT HAND  Final   Special Requests IN PEDIATRIC BOTTLE 1CC  Final   Culture NO GROWTH < 24 HOURS  Final   Report Status PENDING  Incomplete    RADIOLOGY STUDIES/RESULTS: Dg Chest 2 View  Result Date: 09/29/2015 CLINICAL DATA:  Chest pain and shortness of Breath EXAM: CHEST  2 VIEW COMPARISON:  09/25/2015 FINDINGS: Moderate cardiac enlargement. There is a left pleural effusion which appears increased from previous exam. Overlying atelectasis noted. Pulmonary vascular congestion is noted. Right lung appears clear. Stent is noted within the superior vena cava. IMPRESSION: 1. Increase in volume of left pleural effusion. Electronically Signed   By: Kerby Moors M.D.   On: 09/29/2015 17:39   Dg Chest 2 View  Result Date: 09/25/2015 CLINICAL DATA:  LEFT chest pain and dyspnea beginning this  morning. Tachycardia. History of stent. EXAM: CHEST  2 VIEW COMPARISON:  Chest radiograph Jun 17, 2015 FINDINGS: Cardiac silhouette is mildly enlarged and unchanged. Stent projects in RIGHT  mediastinum. Mild similar bronchitic changes in pulmonary vascular congestion without pleural effusion or focal consolidation. Mildly elevated RIGHT hemidiaphragm. No pneumothorax. ACDF. Surgical clips in the abdomen. IMPRESSION: Mild cardiomegaly. Similar bronchitic changes and mild pulmonary vascular congestion. Electronically Signed   By: Elon Alas M.D.   On: 09/25/2015 20:56   Ct Chest Wo Contrast  Result Date: 09/30/2015 CLINICAL DATA:  Chest pain and difficulty breathing for 5 days. EXAM: CT CHEST WITHOUT CONTRAST TECHNIQUE: Multidetector CT imaging of the chest was performed following the standard protocol without IV contrast. COMPARISON:  CT chest from 9/20 2/6 FINDINGS: Cardiovascular: There is mild cardiac enlargement. Aortic atherosclerosis noted. Calcification within the RCA coronary artery noted. There is a metallic stent identified within the superior vena cava. Increase in size of the azygos and hemi azygous veins with multiple posterior mediastinal collateral vessels. There is also extensive chest wall collateral vessels. Mediastinum/Nodes: The trachea appears patent and is midline. Normal appearance of the esophagus. Multiple calcified and noncalcified lymph nodes are identified. Within the limitations of noncontrast technique no definite evidence for mediastinal or hilar adenopathy. Lungs/Pleura: There is a loculated pleural effusion overlying the left hemidiaphragm which measures approximately 6.9 x 3.8 cm. Overlying airspace consolidation is identified. Masslike consolidation within the left upper lobe measures 4.2 cm, image number 12 of series 205. Subsegmental atelectasis is noted in the posterior right base and right middle lobe. Upper Abdomen: The visualized portions of the liver and spleen are  unremarkable. Surgical clips are identified within the retroperitoneum of the upper abdomen. Musculoskeletal: No aggressive lytic or sclerotic bone lesions identified. IMPRESSION: 1. There is a loculated pleural effusion identified in the left lung base with overlying airspace consolidation in atelectasis. Cannot rule out empyema. 2. Masslike consolidation in the left upper lobe is noted. In the acute setting this is favored to represent pneumonia. This will need follow-up imaging to ensure resolution. If this does not resolve then further investigation with PET-CT and tissue sampling would be advised. 3. There is a metallic stent within the SVC. 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 4. Prior granulomatous disease 5. Aortic atherosclerosis and  coronary artery calcifications. Electronically Signed   By: Kerby Moors M.D.   On: 09/30/2015 15:02   [name] Chest  Result Date: 10/01/2015 CLINICAL DATA:  Patient with left-sided chest pain with a recent CT scan suggesting a loculated left-sided pleural effusion. Request is made for diagnostic and therapeutic thoracentesis if able. EXAM: CHEST ULTRASOUND COMPARISON:  None. FINDINGS: Trace amount of simple anechoic fluid noted in the left chest. No evidence of loculations. IMPRESSION: Trace amount of simple appearing left-sided pleural fluid, not enough to allow for safe ultrasound-guided paracentesis. Read by: Saverio Danker, PA-C Electronically Signed   By: Sandi Mariscal M.D.   On: 10/01/2015 11:08   Nm Pulmonary Perf And Vent  Result Date: 09/26/2015 CLINICAL DATA:  Left-sided pleuritic chest pain. EXAM: NUCLEAR MEDICINE VENTILATION - PERFUSION LUNG SCAN TECHNIQUE: Ventilation images were obtained in multiple projections using inhaled aerosol Tc-89m DTPA. Perfusion images were obtained in multiple projections after intravenous injection of Tc-33m MAA. RADIOPHARMACEUTICALS:  31.2  mCi Technetium-56m DTPA aerosol inhalation and 4.06 mCi Technetium-96m MAA IV COMPARISON:  Chest radiograph yesterday. FINDINGS: Ventilation: No focal ventilation defect. Perfusion: No wedge shaped peripheral perfusion defects to suggest acute pulmonary embolism. Liver activity is seen on perfusion imaging likely due to trans portal venous collaterals, multiple collaterals noted on CT abdomen/pelvis  06/13/2014. IMPRESSION: Low probability for pulmonary embolus. Electronically Signed   By: Jeb Levering M.D.   On: 09/26/2015 04:44     LOS: 2 days   Oren Binet, MD  [facility] Pager:[phone]  If 7PM-7AM, please contact night-coverage [email or web address] Password [email or web address] 10/02/2015, 12:54 PM

## 2015-10-02 NOTE — Progress Notes (Signed)
Pt requested to go for a walk last night. Pt discouraged from going off the unit. Pt instructed by this RN that the cardiac monitor does not record once she goes past the elevators. Pt decided to go off the unit. Security was called by this Therapist, sports and was made aware via voicemail. Pt later returned to unit smelling like cigarette smoke. Will continue to monitor.

## 2015-10-02 NOTE — Progress Notes (Signed)
Patient refused bed alarm. Will continue to monitor patient. 

## 2015-10-03 LAB — CBC
HCT: 23.7 % — ABNORMAL LOW (ref 36.0–46.0)
Hemoglobin: 7.9 g/dL — ABNORMAL LOW (ref 12.0–15.0)
MCH: 30.7 pg (ref 26.0–34.0)
MCHC: 33.3 g/dL (ref 30.0–36.0)
MCV: 92.2 fL (ref 78.0–100.0)
Platelets: 283 10*3/uL (ref 150–400)
RBC: 2.57 MIL/uL — ABNORMAL LOW (ref 3.87–5.11)
RDW: 19.9 % — ABNORMAL HIGH (ref 11.5–15.5)
WBC: 4.3 10*3/uL (ref 4.0–10.5)

## 2015-10-03 LAB — BASIC METABOLIC PANEL
Anion gap: 10 (ref 5–15)
BUN: 51 mg/dL — ABNORMAL HIGH (ref 6–20)
CO2: 20 mmol/L — ABNORMAL LOW (ref 22–32)
Calcium: 9.1 mg/dL (ref 8.9–10.3)
Chloride: 103 mmol/L (ref 101–111)
Creatinine, Ser: 3.05 mg/dL — ABNORMAL HIGH (ref 0.44–1.00)
GFR calc Af Amer: 20 mL/min — ABNORMAL LOW (ref >60)
GFR calc non Af Amer: 17 mL/min — ABNORMAL LOW (ref >60)
Glucose, Bld: 84 mg/dL (ref 65–99)
Potassium: 4.4 mmol/L (ref 3.5–5.1)
Sodium: 133 mmol/L — ABNORMAL LOW (ref 135–145)

## 2015-10-03 LAB — TACROLIMUS LEVEL: Tacrolimus (FK506) - LabCorp: 17.8 ng/mL (ref 2.0–20.0)

## 2015-10-03 MED ORDER — AMOXICILLIN-POT CLAVULANATE 500-125 MG PO TABS: 1.0000 | ORAL_TABLET | Freq: Every day | ORAL | 0 refills | Status: DC

## 2015-10-03 NOTE — Progress Notes (Signed)
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  She appears to be a chronically ill individual with multiple comorbidities.  She has a worsening anemia in the absence of blood loss, complicating matters as she is on her third kidney transplant.  Recommendations as follows:  1.  Acute kidney injury: Baseline creatinine is labile and I believe related to volume status.  Stabilizing.  2.  s/p living related donor kidney transplantation 2005: secondary to chronic GN .  This is her third transplant as as such she is a highly sensitized individual.  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  Continue azathioprine 100 mg daily, prograf 3 mg BID, and prednisone 5 mg daily.  will follow up 12-hour prograf trough from 8/29; appears to be collected at ~1400 which would be after her first dose of AM tac so the level likely won't be accurate, will re-order for 8/31 AM.  CNIs such as tacrolimus can interact with macrolide antibiotics; more clarithromycin and erythromycin, but azithromycin can potentiate level of tac too so will be important to see accurate trough.  3.  Hyponatremia: likely due to volume depletion, improving and treatment of infection  4.  Anemia: Last outpatient Hgb 08/2015 9.9; hemoglobin nadired here at 6.6.  S/p 1 u pRBCs 8/29 and Aranesp 100 mcg 8/29 as well.    5. Multifocal pneumonia: continuing azithro (start date 8/28), switching from CTX to PO augmentin today in prep for d/c.  defervescing and CP is better.  Inocencio Homes was attempted 8/29, not enough fluid to tap.  She will certainly need followup chest CT in 3-4 weeks to ensure resolution.    6.  Dispo: to see Dr. Marval Regal 9/13 at 3:15 pm.  Pt will need her ESA at Short Stay rescheduled (was supposed to go  9/1).  Subjective:    Feeling better, asking for regular diet.     Objective:   BP (!) 97/58 (BP Location: Left Arm)   Pulse (!) 59   Temp 98.8 F (37.1 C) (Oral)   Resp 18   Ht 5' (1.524 m)   Wt 51.8 kg (114 lb 4.8 oz) Comment: scale a  SpO2 100%   BMI 22.32 kg/m   Intake/Output Summary (Last 24 hours) at 10/03/15 1219 Last data filed at 10/03/15 0957  Gross per 24 hour  Intake              838 ml  Output              450 ml  Net              388 ml   Weight change: -0.136 kg (-4.8 oz)  Physical Exam: GEN: sitting up in bed. HEENT: R eye partially closed and blind, L eye EOMI, PERRL NECK: flat neck veins PULM: much better inspiratory effort, some L sided crackles CV RRR no m/r/g ABD: soft, NABS EXT: no LE edema NEURO: AAO x 3 SKIN: hyperpigmented firm areas consistent with known diagnosis of NSF bilateral legs and thighs.  + collateral vessels underneath the skin on abd especially  Imaging: No results found.  Labs: BMET  Recent Labs Lab 09/29/15 1648 09/30/15 0518 10/01/15 0348 10/02/15 0311 10/03/15 0237  NA 126* 134* 133* 133* 133*  K 4.2 4.4 4.7  4.1 4.4  CL 96* 102 101 99* 103  CO2 21* 22 22 21* 20*  GLUCOSE 148* 100* 79 87 84  BUN 46* 46* 46* 49* 51*  CREATININE 3.41* 3.24* 2.74* 3.16* 3.05*  CALCIUM 9.0 8.9 9.1 9.3 9.1  PHOS  --   --  4.0 4.0  --    CBC  Recent Labs Lab 09/30/15 1220 10/01/15 0348 10/01/15 2035 10/02/15 0315 10/03/15 0948  WBC 4.6 3.4*  --  4.5 4.3  HGB 7.1* 6.6* 8.4* 8.4* 7.9*  HCT 22.3* 19.9* 26.4* 25.9* 23.7*  MCV 94.1 93.4  --  92.5 92.2  PLT 213 220  --  253 283    Medications:    . aspirin EC  81 mg Oral BH-q7a  . azaTHIOprine  100 mg Oral Daily  . azithromycin  500 mg Oral Daily  . cefTRIAXone (ROCEPHIN)  IV  1 g Intravenous Q24H  . enoxaparin (LOVENOX) injection  30 mg Subcutaneous Q24H  . metoCLOPramide  5 mg Oral BID  . pantoprazole  40 mg Oral Daily  . polyethylene glycol  17 g Oral BID  .  predniSONE  5 mg Oral QAC breakfast  . sodium chloride flush  3 mL Intravenous Q12H  . tacrolimus  3 mg Oral BID      Madelon Lips, MD [place]  pgr [phone] cell [phone] 10/03/2015, 12:19 PM

## 2015-10-03 NOTE — Discharge Summary (Signed)
PATIENT DETAILS Name: Tina Watkins Age: 50 y.o. Sex: female Date of Birth: December 18, 1965 MRN: [id number]. Admitting Physician: Reubin Milan, MD [id number] Denice Paradise, MD  Admit Date: 09/29/2015 Discharge date: 10/03/2015  Recommendations for Outpatient Follow-up:  1. Follow up with PCP on 9/13 2. Please obtain BMP/CBC in one week 3. Repeat CT chest in approximately 4 weeks' time 4. Please follow up on the following pending results: Blood cultures until final  Admitted From:  Home  Disposition: [place]: No  Equipment/Devices: None  Discharge Condition: Stable  CODE STATUS: FULL CODE  Diet recommendation:  Heart Healthy   Brief Summary: See H&P, Labs, Consult and Test reports for all details in brief,Patient is a 50 y.o. female with past medical history of stage IV chronic kidney disease, renal transplantation on immunosuppressives who presented to the hospital with 1 week history of chest pain, subjective fever. She was seen in the emergency room on 8/23 for similar complaints, a VQ scan was read as low probability, she was given Omnicef-patient declined admission then, as she had a planned trip to [place]. She unfortunately continued to have chest pain, but was no longer having any fevers, she returned back from [place] on 8/27- and came straight to the emergency room where she was admitted to the hospitalist service. Further evaluation with a CT chest shows PNA, she was started on intravenous Rocephin and Zithromax, by day of discharge chest pain had improved significantly-she was much more comfortable. She is being transitioned to Augmentin (500 mg Qdaily-d/w Pharmacist) on discharge, she has been asked to follow-up with a primary care doctor and repeat a CT scan of the chest in one month. Please see below for further details.  Brief Hospital Course: Chest pain: likely 2/2 PNA. VQ scan on 8/24 was read as low probability, Lower Ext Dopples on 8/28  negative for DVT.EKG/troponins are negative.she was provided supportive care, by day of discharge chest pain had improved significantly-but still occasionally present, she appears significantly more comfortable than she did 3 days back when she first presented to the hospital. She is on chronic narcotics at home, and this is being continued.  Community-acquired pneumonia: CT chest done during this hospitalization showed left-sided pneumonia. She was subsequently started on Rocephin and Zithromax.  Even though effusion was seen on CT scan-no significant pleural fluid seen on ultrasound chest to warrant thoracocentesis. Please note, only trace amount of free flowing fluid was seen. Blood cultures on 8/28 negative so far (patient was already on several days of antibiotics prior to this admission) urine streptococcal and legionella antigen negative. Since she is improving on Rocephin and Zithromax, I will transition her to Augmentin and continue it for a few more days. I have instructed her to follow-up with her PCP in the next 1-2 weeks, and have recommended that she repeat a CT scan in at least 4 weeks to ensure resolution of the infiltrates. If these infiltrates are still present after 4 weeks, she may require a PET scan or pulmonology consult for further evaluation as she is immunocompromised.  Acute on chronic kidney disease stage IV: Creatinine labile-but suspect within her usual range. Nephrology was consulted during this hospital stay, plans are to avoid nephrotoxic agents and to continue close outpatient monitoring. Appointment with nephrology made for 9/13.  Hyponatremia: Improved with gentle hydration-Suspect related to volume loss from fever/dehydration. Monitor electrolytes in the outpatient setting  History of renal transplant: Continue immunosuppressives-azathioprine/Prograf/prednisone. Continue outpatient follow-up with nephrology-Appointment scheduled for  9/13  Anemia: Has chronic  anemia-likely secondary to renal disease at baseline, has had drop in hemoglobin-I suspect this was secondary to IV fluid dilution/acute illness. She denies any history of hematochezia, melena or hematemesisShe was initially reluctant to undergo PRBC transfusion, however did agree on 8/29. Hemoglobin stable at 7.9. Nephrology dosed Aranesp while inpatient. Continue to follow CBC closely in the outpatient setting.   Borderline hypotension: Appears asymptomatic-renal function stable- follow for now-may need initiation of Midodrine in the near future. Lasix discontinued for now as her volume status appears euvolemic.  History of gastroparesis: No vomiting-continue Reglan  GERD: Continue PPI  Chronic pain disorder: Continue usual narcotics-may need dose adjustment if blood pressure continues to be soft. She follows with a pain management M.D., she has been asked to keep her next appointment.  Constipation:continue Miralax  Anxiety: Continue as needed Xanax  Tobacco abuse: Counseled  Procedures/Studies: Echo 8/29>> EF 60-65%, PA pressure 48 mmHg. Trivial pericardial effusion Bilateral lower extremity Doppler 8/28>> negative for DVT VQ scan 8/24>> low probability   Discharge Diagnoses:  Principal Problem:   Chest pain Active Problems:   Gastroparesis   ESRD (end stage renal disease) (HCC)   Anxiety   Chronic pain disorder   Acute on chronic renal failure (HCC)   History of renal transplant   Anemia in chronic kidney disease  Community acquired pneumonia  Discharge Instructions:  Activity:  As tolerated with Full fall precautions use walker/cane & assistance as needed  Discharge Instructions    Call MD for:  difficulty breathing, headache or visual disturbances    Complete by:  As directed   Call MD for:  severe uncontrolled pain    Complete by:  As directed   Call MD for:  temperature >100.4    Complete by:  As directed   Diet - low sodium heart healthy    Complete by:   As directed   Discharge instructions    Complete by:  As directed   You had Pneumonia: Please ask your primary MD to repeat a CT Scan of chest in 4 weeks after hospital discharge      Follow with Primary MD  [name], MD on 9/13  Please get a complete blood count and chemistry panel checked by your Primary MD at your next visit, and again as instructed by your Primary MD.  Please ask your primary doctor to repeat CT scan of chest in 4 weeks  Get Medicines reviewed and adjusted: Please take all your medications with you for your next visit with your Primary MD  Laboratory/radiological data: Please request your Primary MD to go over all hospital tests and procedure/radiological results at the follow up, please ask your Primary MD to get all Hospital records sent to his/her office.  In some cases, they will be blood work, cultures and biopsy results pending at the time of your discharge. Please request that your primary care M.D. follows up on these results.  Also Note the following: If you experience worsening of your admission symptoms, develop shortness of breath, life threatening emergency, suicidal or homicidal thoughts you must seek medical attention immediately by calling 911 or calling your MD immediately  if symptoms less severe.  You must read complete instructions/literature along with all the possible adverse reactions/side effects for all the Medicines you take and that have been prescribed to you. Take any new Medicines after you have completely understood and accpet all the possible adverse reactions/side effects.   Do not drive  when taking Pain medications or sleeping medications (Benzodaizepines)  Do not take more than prescribed Pain, Sleep and Anxiety Medications. It is not advisable to combine anxiety,sleep and pain medications without talking with your primary care practitioner  Special Instructions: If you have smoked or chewed Tobacco  in the last 2 yrs  please stop smoking, stop any regular Alcohol  and or any Recreational drug use.  Wear Seat belts while driving.  Please note: You were cared for by a hospitalist during your hospital stay. Once you are discharged, your primary care physician will handle any further medical issues. Please note that NO REFILLS for any discharge medications will be authorized once you are discharged, as it is imperative that you return to your primary care physician (or establish a relationship with a primary care physician if you do not have one) for your post hospital discharge needs so that they can reassess your need for medications and monitor your lab values.   Increase activity slowly    Complete by:  As directed       Medication List    STOP taking these medications   cefdinir 300 MG capsule Commonly known as:  OMNICEF   furosemide 40 MG tablet Commonly known as:  LASIX     TAKE these medications   ALPRAZolam 0.25 MG tablet Commonly known as:  XANAX Take 0.25 mg by mouth daily as needed (FOR SVT).   amoxicillin-clavulanate 500-125 MG tablet Commonly known as:  AUGMENTIN Take 1 tablet (500 mg total) by mouth daily.   aspirin EC 81 MG tablet Take 81 mg by mouth every morning.   azaTHIOprine 50 MG tablet Commonly known as:  IMURAN Take 100 mg by mouth daily.   colchicine 0.6 MG tablet Take 0.6 mg by mouth daily as needed (For gout.).   EMBEDA 20-0.8 MG Cpcr Generic drug:  Morphine-Naltrexone Take 1 capsule by mouth daily.   metoCLOPramide 5 MG tablet Commonly known as:  REGLAN Take 5 mg by mouth 2 (two) times daily. 5 mg every morning and 5 mg every evening at bedtime.  As needed during the day   omeprazole 40 MG capsule Commonly known as:  PRILOSEC Take 40 mg by mouth 2 (two) times daily.   oxyCODONE-acetaminophen 10-325 MG tablet Commonly known as:  PERCOCET Take 1 tablet by mouth every 6 (six) hours as needed for pain.   predniSONE 5 MG tablet Commonly known as:   DELTASONE Take 5 mg by mouth daily.   PRENATAL VITAMIN PO Take 1 tablet by mouth daily.   promethazine 25 MG tablet Commonly known as:  PHENERGAN Take 1 tablet (25 mg total) by mouth every 6 (six) hours as needed for nausea or vomiting.   tacrolimus 1 MG capsule Commonly known as:  PROGRAF Take 3 mg by mouth 2 (two) times daily.   zolpidem 10 MG tablet Commonly known as:  AMBIEN Take 10 mg by mouth at bedtime as needed for sleep.      Follow-up Information    [name], MD Follow up on 10/16/2015.   Specialty:  Nephrology Why:  Appointment at 3:15 pm Contact information: [address] [place] [address] [phone]          Allergies  Allergen Reactions  . Levofloxacin Itching and Rash      Consultations:   nephrology     Other Procedures/Studies: Dg Chest 2 View  Result Date: 09/29/2015 CLINICAL DATA:  Chest pain and shortness of Breath EXAM: CHEST  2  VIEW COMPARISON:  09/25/2015 FINDINGS: Moderate cardiac enlargement. There is a left pleural effusion which appears increased from previous exam. Overlying atelectasis noted. Pulmonary vascular congestion is noted. Right lung appears clear. Stent is noted within the superior vena cava. IMPRESSION: 1. Increase in volume of left pleural effusion. Electronically Signed   By: Kerby Moors M.D.   On: 09/29/2015 17:39   Dg Chest 2 View  Result Date: 09/25/2015 CLINICAL DATA:  LEFT chest pain and dyspnea beginning this morning. Tachycardia. History of stent. EXAM: CHEST  2 VIEW COMPARISON:  Chest radiograph Jun 17, 2015 FINDINGS: Cardiac silhouette is mildly enlarged and unchanged. Stent projects in RIGHT mediastinum. Mild similar bronchitic changes in pulmonary vascular congestion without pleural effusion or focal consolidation. Mildly elevated RIGHT hemidiaphragm. No pneumothorax. ACDF. Surgical clips in the abdomen. IMPRESSION: Mild cardiomegaly. Similar bronchitic changes and mild pulmonary vascular  congestion. Electronically Signed   By: Elon Alas M.D.   On: 09/25/2015 20:56   Ct Chest Wo Contrast  Result Date: 09/30/2015 CLINICAL DATA:  Chest pain and difficulty breathing for 5 days. EXAM: CT CHEST WITHOUT CONTRAST TECHNIQUE: Multidetector CT imaging of the chest was performed following the standard protocol without IV contrast. COMPARISON:  CT chest from 9/20 2/6 FINDINGS: Cardiovascular: There is mild cardiac enlargement. Aortic atherosclerosis noted. Calcification within the RCA coronary artery noted. There is a metallic stent identified within the superior vena cava. Increase in size of the azygos and hemi azygous veins with multiple posterior mediastinal collateral vessels. There is also extensive chest wall collateral vessels. Mediastinum/Nodes: The trachea appears patent and is midline. Normal appearance of the esophagus. Multiple calcified and noncalcified lymph nodes are identified. Within the limitations of noncontrast technique no definite evidence for mediastinal or hilar adenopathy. Lungs/Pleura: There is a loculated pleural effusion overlying the left hemidiaphragm which measures approximately 6.9 x 3.8 cm. Overlying airspace consolidation is identified. Masslike consolidation within the left upper lobe measures 4.2 cm, image number 12 of series 205. Subsegmental atelectasis is noted in the posterior right base and right middle lobe. Upper Abdomen: The visualized portions of the liver and spleen are unremarkable. Surgical clips are identified within the retroperitoneum of the upper abdomen. Musculoskeletal: No aggressive lytic or sclerotic bone lesions identified. IMPRESSION: 1. There is a loculated pleural effusion identified in the left lung base with overlying airspace consolidation in atelectasis. Cannot rule out empyema. 2. Masslike consolidation in the left upper lobe is noted. In the acute setting this is favored to represent pneumonia. This will need follow-up imaging to  ensure resolution. If this does not resolve then further investigation with PET-CT and tissue sampling would be advised. 3. There is a metallic stent within the SVC. 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 4. Prior granulomatous disease 5. Aortic atherosclerosis and  coronary artery calcifications. Electronically Signed   By: Kerby Moors M.D.   On: 09/30/2015 15:02   [name] Chest  Result Date: 10/01/2015 CLINICAL DATA:  Patient with left-sided chest pain with a recent CT scan suggesting a loculated left-sided pleural effusion. Request is made for diagnostic and therapeutic thoracentesis if able. EXAM: CHEST ULTRASOUND COMPARISON:  None. FINDINGS: Trace amount of simple anechoic fluid noted in the left chest. No evidence of loculations. IMPRESSION: Trace amount of simple appearing left-sided pleural fluid, not enough to allow for safe ultrasound-guided paracentesis. Read by: Saverio Danker, PA-C Electronically Signed   By: Eldridge Abrahams.D.  On: 10/01/2015 11:08   Nm Pulmonary Perf And Vent  Result Date: 09/26/2015 CLINICAL DATA:  Left-sided pleuritic chest pain. EXAM: NUCLEAR MEDICINE VENTILATION - PERFUSION LUNG SCAN TECHNIQUE: Ventilation images were obtained in multiple projections using inhaled aerosol Tc-56m DTPA. Perfusion images were obtained in multiple projections after intravenous injection of Tc-25m MAA. RADIOPHARMACEUTICALS:  31.2 mCi Technetium-47m DTPA aerosol inhalation and 4.06 mCi Technetium-29m MAA IV COMPARISON:  Chest radiograph yesterday. FINDINGS: Ventilation: No focal ventilation defect. Perfusion: No wedge shaped peripheral perfusion defects to suggest acute pulmonary embolism. Liver activity is seen on perfusion imaging likely due to trans portal venous collaterals, multiple collaterals noted on CT abdomen/pelvis 06/13/2014. IMPRESSION: Low probability for pulmonary embolus. Electronically Signed    By: Jeb Levering M.D.   On: 09/26/2015 04:44      TODAY-DAY OF DISCHARGE:  Subjective:   [name] today has no headache,no abdominal pain,no new weakness tingling or numbness, feels much better wants to go home today. Chest pain is significantly better  Objective:   Blood pressure (!) 97/58, pulse (!) 59, temperature 98.8 F (37.1 C), temperature source Oral, resp. rate 18, height 5' (1.524 m), weight 51.8 kg (114 lb 4.8 oz), SpO2 100 %.  Intake/Output Summary (Last 24 hours) at 10/03/15 1319 Last data filed at 10/03/15 0957  Gross per 24 hour  Intake              838 ml  Output              450 ml  Net              388 ml   Filed Weights   10/01/15 0350 10/02/15 0641 10/03/15 0408  Weight: 52.5 kg (115 lb 12.8 oz) 52 kg (114 lb 9.6 oz) 51.8 kg (114 lb 4.8 oz)    Exam: Awake Alert, Oriented *3, No new F.N deficits, Normal affect [place].AT,PERRAL Supple Neck,No JVD, No cervical lymphadenopathy appriciated.  Symmetrical Chest wall movement, Good air movement bilaterally, CTAB RRR,No Gallops,Rubs or new Murmurs, No Parasternal Heave +ve B.Sounds, Abd Soft, Non tender, No organomegaly appriciated, No rebound -guarding or rigidity. No Cyanosis, Clubbing or edema, No new Rash or bruise   PERTINENT RADIOLOGIC STUDIES: Dg Chest 2 View  Result Date: 09/29/2015 CLINICAL DATA:  Chest pain and shortness of Breath EXAM: CHEST  2 VIEW COMPARISON:  09/25/2015 FINDINGS: Moderate cardiac enlargement. There is a left pleural effusion which appears increased from previous exam. Overlying atelectasis noted. Pulmonary vascular congestion is noted. Right lung appears clear. Stent is noted within the superior vena cava. IMPRESSION: 1. Increase in volume of left pleural effusion. Electronically Signed   By: Kerby Moors M.D.   On: 09/29/2015 17:39   Dg Chest 2 View  Result Date: 09/25/2015 CLINICAL DATA:  LEFT chest pain and dyspnea beginning this morning. Tachycardia. History of stent.  EXAM: CHEST  2 VIEW COMPARISON:  Chest radiograph Jun 17, 2015 FINDINGS: Cardiac silhouette is mildly enlarged and unchanged. Stent projects in RIGHT mediastinum. Mild similar bronchitic changes in pulmonary vascular congestion without pleural effusion or focal consolidation. Mildly elevated RIGHT hemidiaphragm. No pneumothorax. ACDF. Surgical clips in the abdomen. IMPRESSION: Mild cardiomegaly. Similar bronchitic changes and mild pulmonary vascular congestion. Electronically Signed   By: Elon Alas M.D.   On: 09/25/2015 20:56   Ct Chest Wo Contrast  Result Date: 09/30/2015 CLINICAL DATA:  Chest pain and difficulty breathing for 5 days. EXAM: CT CHEST WITHOUT CONTRAST TECHNIQUE: Multidetector CT imaging of the chest was  performed following the standard protocol without IV contrast. COMPARISON:  CT chest from 9/20 2/6 FINDINGS: Cardiovascular: There is mild cardiac enlargement. Aortic atherosclerosis noted. Calcification within the RCA coronary artery noted. There is a metallic stent identified within the superior vena cava. Increase in size of the azygos and hemi azygous veins with multiple posterior mediastinal collateral vessels. There is also extensive chest wall collateral vessels. Mediastinum/Nodes: The trachea appears patent and is midline. Normal appearance of the esophagus. Multiple calcified and noncalcified lymph nodes are identified. Within the limitations of noncontrast technique no definite evidence for mediastinal or hilar adenopathy. Lungs/Pleura: There is a loculated pleural effusion overlying the left hemidiaphragm which measures approximately 6.9 x 3.8 cm. Overlying airspace consolidation is identified. Masslike consolidation within the left upper lobe measures 4.2 cm, image number 12 of series 205. Subsegmental atelectasis is noted in the posterior right base and right middle lobe. Upper Abdomen: The visualized portions of the liver and spleen are unremarkable. Surgical clips are  identified within the retroperitoneum of the upper abdomen. Musculoskeletal: No aggressive lytic or sclerotic bone lesions identified. IMPRESSION: 1. There is a loculated pleural effusion identified in the left lung base with overlying airspace consolidation in atelectasis. Cannot rule out empyema. 2. Masslike consolidation in the left upper lobe is noted. In the acute setting this is favored to represent pneumonia. This will need follow-up imaging to ensure resolution. If this does not resolve then further investigation with PET-CT and tissue sampling would be advised. 3. There is a metallic stent within the SVC. 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 4. Prior granulomatous disease 5. Aortic atherosclerosis and  coronary artery calcifications. Electronically Signed   By: Kerby Moors M.D.   On: 09/30/2015 15:02   [name] Chest  Result Date: 10/01/2015 CLINICAL DATA:  Patient with left-sided chest pain with a recent CT scan suggesting a loculated left-sided pleural effusion. Request is made for diagnostic and therapeutic thoracentesis if able. EXAM: CHEST ULTRASOUND COMPARISON:  None. FINDINGS: Trace amount of simple anechoic fluid noted in the left chest. No evidence of loculations. IMPRESSION: Trace amount of simple appearing left-sided pleural fluid, not enough to allow for safe ultrasound-guided paracentesis. Read by: Saverio Danker, PA-C Electronically Signed   By: Sandi Mariscal M.D.   On: 10/01/2015 11:08   Nm Pulmonary Perf And Vent  Result Date: 09/26/2015 CLINICAL DATA:  Left-sided pleuritic chest pain. EXAM: NUCLEAR MEDICINE VENTILATION - PERFUSION LUNG SCAN TECHNIQUE: Ventilation images were obtained in multiple projections using inhaled aerosol Tc-41m DTPA. Perfusion images were obtained in multiple projections after intravenous injection of Tc-68m MAA. RADIOPHARMACEUTICALS:  31.2 mCi Technetium-60m DTPA aerosol  inhalation and 4.06 mCi Technetium-70m MAA IV COMPARISON:  Chest radiograph yesterday. FINDINGS: Ventilation: No focal ventilation defect. Perfusion: No wedge shaped peripheral perfusion defects to suggest acute pulmonary embolism. Liver activity is seen on perfusion imaging likely due to trans portal venous collaterals, multiple collaterals noted on CT abdomen/pelvis 06/13/2014. IMPRESSION: Low probability for pulmonary embolus. Electronically Signed   By: Jeb Levering M.D.   On: 09/26/2015 04:44     PERTINENT LAB RESULTS: CBC:  Recent Labs  10/02/15 0315 10/03/15 0948  WBC 4.5 4.3  HGB 8.4* 7.9*  HCT 25.9* 23.7*  PLT 253 283   CMET CMP     Component Value Date/Time   NA 133 (L) 10/03/2015 0237   K 4.4 10/03/2015 0237   CL 103 10/03/2015 0237   CO2  20 (L) 10/03/2015 0237   GLUCOSE 84 10/03/2015 0237   BUN 51 (H) 10/03/2015 0237   CREATININE 3.05 (H) 10/03/2015 0237   CALCIUM 9.1 10/03/2015 0237   PROT 5.9 (L) 09/30/2015 0518   ALBUMIN 3.1 (L) 10/02/2015 0311   AST 20 09/30/2015 0518   ALT 19 09/30/2015 0518   ALKPHOS 74 09/30/2015 0518   BILITOT 0.7 09/30/2015 0518   GFRNONAA 17 (L) 10/03/2015 0237   GFRAA 20 (L) 10/03/2015 0237    GFR Estimated Creatinine Clearance: 16.2 mL/min (by C-G formula based on SCr of 3.05 mg/dL). No results for input(s): LIPASE, AMYLASE in the last 72 hours. No results for input(s): CKTOTAL, CKMB, CKMBINDEX, TROPONINI in the last 72 hours. Invalid input(s): POCBNP No results for input(s): DDIMER in the last 72 hours. No results for input(s): HGBA1C in the last 72 hours. No results for input(s): CHOL, HDL, LDLCALC, TRIG, CHOLHDL, LDLDIRECT in the last 72 hours. No results for input(s): TSH, T4TOTAL, T3FREE, THYROIDAB in the last 72 hours.  Invalid input(s): FREET3  Recent Labs  09/30/15 1843  FERRITIN 2,503*  TIBC 126*  IRON 18*   Coags: No results for input(s): INR in the last 72 hours.  Invalid input(s):  PT Microbiology: Recent Results (from the past 240 hour(s))  Culture, blood (routine x 2)     Status: None (Preliminary result)   Collection Time: 09/30/15  6:33 PM  Result Value Ref Range Status   Specimen Description BLOOD LEFT WRIST  Final   Special Requests BOTTLES DRAWN AEROBIC AND ANAEROBIC 5CC  Final   Culture NO GROWTH 2 DAYS  Final   Report Status PENDING  Incomplete  Culture, blood (routine x 2)     Status: None (Preliminary result)   Collection Time: 09/30/15  6:43 PM  Result Value Ref Range Status   Specimen Description BLOOD RIGHT HAND  Final   Special Requests IN PEDIATRIC BOTTLE 1CC  Final   Culture NO GROWTH 2 DAYS  Final   Report Status PENDING  Incomplete    FURTHER DISCHARGE INSTRUCTIONS:  Get Medicines reviewed and adjusted: Please take all your medications with you for your next visit with your Primary MD  Laboratory/radiological data: Please request your Primary MD to go over all hospital tests and procedure/radiological results at the follow up, please ask your Primary MD to get all Hospital records sent to his/her office.  In some cases, they will be blood work, cultures and biopsy results pending at the time of your discharge. Please request that your primary care M.D. goes through all the records of your hospital data and follows up on these results.  Also Note the following: If you experience worsening of your admission symptoms, develop shortness of breath, life threatening emergency, suicidal or homicidal thoughts you must seek medical attention immediately by calling 911 or calling your MD immediately  if symptoms less severe.  You must read complete instructions/literature along with all the possible adverse reactions/side effects for all the Medicines you take and that have been prescribed to you. Take any new Medicines after you have completely understood and accpet all the possible adverse reactions/side effects.   Do not drive when taking Pain  medications or sleeping medications (Benzodaizepines)  Do not take more than prescribed Pain, Sleep and Anxiety Medications. It is not advisable to combine anxiety,sleep and pain medications without talking with your primary care practitioner  Special Instructions: If you have smoked or chewed Tobacco  in the last 2 yrs please stop  smoking, stop any regular Alcohol  and or any Recreational drug use.  Wear Seat belts while driving.  Please note: You were cared for by a hospitalist during your hospital stay. Once you are discharged, your primary care physician will handle any further medical issues. Please note that NO REFILLS for any discharge medications will be authorized once you are discharged, as it is imperative that you return to your primary care physician (or establish a relationship with a primary care physician if you do not have one) for your post hospital discharge needs so that they can reassess your need for medications and monitor your lab values.  Total Time spent coordinating discharge including counseling, education and face to face time equals 45 minutes.  SignedOren Binet 10/03/2015 1:19 PM

## 2015-10-04 ENCOUNTER — Encounter (HOSPITAL_COMMUNITY): Payer: Medicaid Other

## 2015-10-05 LAB — CULTURE, BLOOD (ROUTINE X 2)
Culture: NO GROWTH
Culture: NO GROWTH

## 2015-10-13 ENCOUNTER — Other Ambulatory Visit: Payer: Self-pay | Admitting: Gastroenterology

## 2015-10-16 ENCOUNTER — Other Ambulatory Visit (HOSPITAL_COMMUNITY): Payer: Self-pay | Admitting: *Deleted

## 2015-10-17 ENCOUNTER — Encounter (HOSPITAL_COMMUNITY)
Admission: RE | Admit: 2015-10-17 | Discharge: 2015-10-17 | Disposition: A | Discharge status: Home / Self Care | Payer: Medicaid Other | Source: Ambulatory Visit | Attending: Nephrology | Admitting: Nephrology

## 2015-10-17 DIAGNOSIS — N185 Chronic kidney disease, stage 5: Secondary | ICD-10-CM | POA: Insufficient documentation

## 2015-10-17 DIAGNOSIS — D631 Anemia in chronic kidney disease: Secondary | ICD-10-CM | POA: Insufficient documentation

## 2015-10-17 DIAGNOSIS — Z940 Kidney transplant status: Secondary | ICD-10-CM | POA: Diagnosis not present

## 2015-10-17 DIAGNOSIS — D638 Anemia in other chronic diseases classified elsewhere: Secondary | ICD-10-CM | POA: Diagnosis present

## 2015-10-17 LAB — POCT HEMOGLOBIN-HEMACUE: Hemoglobin: 8.2 g/dL — ABNORMAL LOW (ref 12.0–15.0)

## 2015-10-17 MED ORDER — EPOETIN ALFA 40000 UNIT/ML IJ SOLN
INTRAMUSCULAR | Status: AC
Start: 2015-10-17 — End: 2015-10-17
  Filled 2015-10-17: qty 1

## 2015-10-17 MED ORDER — EPOETIN ALFA 20000 UNIT/ML IJ SOLN
40000.0000 [IU] | INTRAMUSCULAR | Status: DC
  Administered 2015-10-17: 40000 [IU] via SUBCUTANEOUS

## 2015-10-18 MED FILL — Epoetin Alfa Inj 40000 Unit/ML: INTRAMUSCULAR | Qty: 1 | Status: AC

## 2015-10-23 ENCOUNTER — Emergency Department (HOSPITAL_COMMUNITY)
Admission: EM | Admit: 2015-10-23 | Discharge: 2015-10-24 | Disposition: A | Discharge status: Home / Self Care | Payer: Medicaid Other | Source: Home / Self Care

## 2015-10-23 DIAGNOSIS — R531 Weakness: Secondary | ICD-10-CM | POA: Insufficient documentation

## 2015-10-23 DIAGNOSIS — N186 End stage renal disease: Secondary | ICD-10-CM | POA: Insufficient documentation

## 2015-10-23 DIAGNOSIS — Z940 Kidney transplant status: Secondary | ICD-10-CM | POA: Diagnosis not present

## 2015-10-23 DIAGNOSIS — F17210 Nicotine dependence, cigarettes, uncomplicated: Secondary | ICD-10-CM | POA: Insufficient documentation

## 2015-10-23 DIAGNOSIS — Z5321 Procedure and treatment not carried out due to patient leaving prior to being seen by health care provider: Secondary | ICD-10-CM | POA: Insufficient documentation

## 2015-10-23 DIAGNOSIS — R112 Nausea with vomiting, unspecified: Secondary | ICD-10-CM

## 2015-10-23 DIAGNOSIS — Z8673 Personal history of transient ischemic attack (TIA), and cerebral infarction without residual deficits: Secondary | ICD-10-CM

## 2015-10-23 DIAGNOSIS — R918 Other nonspecific abnormal finding of lung field: Secondary | ICD-10-CM | POA: Diagnosis not present

## 2015-10-23 DIAGNOSIS — Z7982 Long term (current) use of aspirin: Secondary | ICD-10-CM | POA: Diagnosis not present

## 2015-10-23 LAB — COMPREHENSIVE METABOLIC PANEL
ALT: 10 U/L — ABNORMAL LOW (ref 14–54)
AST: 26 U/L (ref 15–41)
Albumin: 4.2 g/dL (ref 3.5–5.0)
Alkaline Phosphatase: 89 U/L (ref 38–126)
Anion gap: 12 (ref 5–15)
BUN: 20 mg/dL (ref 6–20)
CO2: 21 mmol/L — ABNORMAL LOW (ref 22–32)
Calcium: 10.2 mg/dL (ref 8.9–10.3)
Chloride: 108 mmol/L (ref 101–111)
Creatinine, Ser: 2.3 mg/dL — ABNORMAL HIGH (ref 0.44–1.00)
GFR calc Af Amer: 28 mL/min — ABNORMAL LOW (ref >60)
GFR calc non Af Amer: 24 mL/min — ABNORMAL LOW (ref >60)
Glucose, Bld: 93 mg/dL (ref 65–99)
Potassium: 4.2 mmol/L (ref 3.5–5.1)
Sodium: 141 mmol/L (ref 135–145)
Total Bilirubin: 0.9 mg/dL (ref 0.3–1.2)
Total Protein: 6.4 g/dL — ABNORMAL LOW (ref 6.5–8.1)

## 2015-10-23 LAB — LIPASE, BLOOD: Lipase: 32 U/L (ref 11–51)

## 2015-10-23 NOTE — ED Triage Notes (Addendum)
Pt reports weakness onset this week after "gastroporesis attack" starting Monday. States that she has generalized abdominal pain, nausea and vomiting. Pt not providing much information in triage.    CBG 86 in triage

## 2015-10-23 NOTE — ED Notes (Signed)
Pt's name called to recheck vitals no answer

## 2015-10-23 NOTE — ED Notes (Signed)
Called pt for redraw,  No answer.  Notified nurse

## 2015-10-23 NOTE — ED Notes (Signed)
Lab reports clotted CBC specimen, order placed for redraw. Pt difficult stick

## 2015-10-24 ENCOUNTER — Encounter (HOSPITAL_COMMUNITY): Payer: Self-pay | Admitting: Emergency Medicine

## 2015-10-24 ENCOUNTER — Emergency Department (HOSPITAL_COMMUNITY): Payer: Medicaid Other

## 2015-10-24 ENCOUNTER — Emergency Department (HOSPITAL_COMMUNITY)
Admission: EM | Admit: 2015-10-24 | Discharge: 2015-10-24 | Disposition: A | Discharge status: Home / Self Care | Payer: Medicaid Other | Attending: Emergency Medicine | Admitting: Emergency Medicine

## 2015-10-24 DIAGNOSIS — Z8673 Personal history of transient ischemic attack (TIA), and cerebral infarction without residual deficits: Secondary | ICD-10-CM | POA: Insufficient documentation

## 2015-10-24 DIAGNOSIS — F17210 Nicotine dependence, cigarettes, uncomplicated: Secondary | ICD-10-CM | POA: Insufficient documentation

## 2015-10-24 DIAGNOSIS — R531 Weakness: Secondary | ICD-10-CM | POA: Insufficient documentation

## 2015-10-24 DIAGNOSIS — N186 End stage renal disease: Secondary | ICD-10-CM | POA: Insufficient documentation

## 2015-10-24 DIAGNOSIS — Z940 Kidney transplant status: Secondary | ICD-10-CM | POA: Insufficient documentation

## 2015-10-24 DIAGNOSIS — R918 Other nonspecific abnormal finding of lung field: Secondary | ICD-10-CM | POA: Insufficient documentation

## 2015-10-24 DIAGNOSIS — Z7982 Long term (current) use of aspirin: Secondary | ICD-10-CM | POA: Insufficient documentation

## 2015-10-24 LAB — I-STAT TROPONIN, ED: Troponin i, poc: 0.02 ng/mL (ref 0.00–0.08)

## 2015-10-24 LAB — CBG MONITORING, ED: Glucose-Capillary: 89 mg/dL (ref 65–99)

## 2015-10-24 MED ORDER — MORPHINE SULFATE (PF) 4 MG/ML IV SOLN
4.0000 mg | Freq: Once | INTRAVENOUS | Status: AC
Start: 2015-10-24 — End: 2015-10-24
  Administered 2015-10-24: 4 mg via INTRAVENOUS
  Filled 2015-10-24: qty 1

## 2015-10-24 MED ORDER — SODIUM CHLORIDE 0.9 % IV BOLUS (SEPSIS)
1000.0000 mL | Freq: Once | INTRAVENOUS | Status: AC
  Administered 2015-10-24: 1000 mL via INTRAVENOUS

## 2015-10-24 NOTE — ED Provider Notes (Signed)
Complains of generalized weakness after multiple episodes of vomiting. She last vomited yesterday. She has not vomited approximately 24 hours. No other associated symptoms. No nausea present. She did complain of mild shortness of breath earlier this morning which has since resolved.   Orlie Dakin, MD 10/24/15 1752

## 2015-10-24 NOTE — ED Notes (Signed)
Paged Gan[facility] Cards to resident's phone

## 2015-10-24 NOTE — ED Notes (Signed)
Iv attempts made in the left shoulder and right forearm by self and Learta Codding, RN. No success.

## 2015-10-24 NOTE — ED Notes (Signed)
IV team at bedside 

## 2015-10-24 NOTE — ED Notes (Signed)
Pt states that she wants blood taken when IV team arrives and not by phlebotomist.

## 2015-10-24 NOTE — ED Triage Notes (Addendum)
Pt returned after waiting 7+ hours last night, continues to complain of weakness-- states "had a gastroparesis attack and had been vomiting"   Pt is a kidney transplant pt

## 2015-10-24 NOTE — ED Notes (Signed)
Pt given water for hydration.

## 2015-10-24 NOTE — ED Provider Notes (Signed)
[facility] DEPT Provider Note   CSN: [id number] Arrival date & time: 10/24/15  0848     History   Chief Complaint Chief Complaint  Patient presents with  . Weakness    HPI Tina Watkins is a 50 y.o. female.  HPI   50 year old female presents today with complaints of weakness. She reports a significant past medical history of gastric paresis. She reports she started having tach on Sunday approximate 4 days ago. She reports this was associated with abdominal pain nausea vomiting. She reports that symptoms have improved, but still feels fatigue status post attack. She reports she is able to tolerate by mouth without difficulty and is requesting IV hydration. She reports she still having rib pain from vomiting, denies any acute chest pain. No abdominal pain at the time my evaluation. Patient reports that the rib pain is reminiscent of her episode of pneumonia for which she was hospitalized. Reports she was supposed to have a follow-up CT this week for reevaluation and further management. She denies any cough, shortness of breath, fever or chills.   Past Medical History:  Diagnosis Date  . Bacteremia due to Gram-negative bacteria ([place]) 05/23/2011  . Blind right eye   . Chronic lower back pain   . Complication of anesthesia    "woke up during OR; I have an extremely high tolerance" (12/11/2011) 1 procedure was graft; the other procedures were procedures that are typically done with sedation.  . DDD (degenerative disc disease), cervical   . Depression   . Dysrhythmia    "tachycardia" (12/11/2011) new onset afib 10/15/14 EKG  . E coli bacteremia 06/18/2011  . ESRD (end stage renal disease) ([place]) 06/12/2011  . Fibromyalgia   . Gastroparesis   . Gastroparesis   . GERD (gastroesophageal reflux disease)   . Glaucoma    right eye  . Gout   . H/O carpal tunnel syndrome   . Headache(784.0)    "not often anymore" (12/11/2011)  . Herpes genitalia 1994  . History of blood transfusion      "more than a few times" (12/11/2011)  . History of stomach ulcers   . Hypotension   . Iron deficiency anemia   . New onset a-fib ([place])    10/15/14 EKG  . Osteopenia   . Seizures ([place]) 1994   "post transplant; only have had that one" (12/11/2011)  . Spinal stenosis in cervical region   . Stroke [facility])     left basal ganglia lacunar infarct; Right frontal lobe lacunar infarct.  . Stroke [facility]) ~ 1999; 2001   "briefly lost my vision; lost my right eye" (12/11/2011)    Patient Active Problem List   Diagnosis Date Noted  . Chest pain 09/29/2015  . Cervical stenosis of spinal canal 01/08/2015  . Urinary tract infectious disease   . Muscle spasms of neck 06/15/2014  . Bleeding hemorrhoid 06/15/2014  . Anemia in chronic kidney disease 06/15/2014  . Acute on chronic renal failure ([place]) 06/13/2014  . Pyelonephritis, acute 06/13/2014  . Sepsis ([place]) 06/13/2014  . History of renal transplant   . Chronic pain disorder 04/09/2012  . Dehydration, mild 04/09/2012  . Gout attack 06/23/2011  . Herpes infection 06/23/2011  . Anxiety 06/23/2011  . E coli bacteremia 06/18/2011  . ESRD (end stage renal disease) ([place]) 06/12/2011  . UTI (lower urinary tract infection) 06/12/2011  . Bacteremia due to Gram-negative bacteria ([place]) 05/23/2011  . History of renal transplantation 05/22/2011  . Septic shock(785.52) 05/22/2011  . Acute on  chronic kidney failure ([place]) 05/22/2011  . Gastroparesis 04/24/2008  . WEIGHT LOSS 08/24/2007  . NAUSEA AND VOMITING 08/24/2007    Past Surgical History:  Procedure Laterality Date  . ANTERIOR CERVICAL DECOMP/DISCECTOMY FUSION N/A 01/08/2015   Procedure: Anterior Cervical Three-Four/Four-Five Decompression/Diskectomy/Fusion;  Surgeon: Leeroy Cha, MD;  Location: [facility];  Service: Neurosurgery;  Laterality: N/A;  C3-4 C4-5 Anterior cervical decompression/diskectomy/fusion  . APPENDECTOMY  ~ 2004  . CATARACT EXTRACTION     right eye  . COLONOSCOPY    .  ENUCLEATION  2001   "right"  . ESOPHAGOGASTRODUODENOSCOPY (EGD) WITH PROPOFOL N/A 04/21/2012   Procedure: ESOPHAGOGASTRODUODENOSCOPY (EGD) WITH PROPOFOL;  Surgeon: Milus Banister, MD;  Location: [facility];  Service: Endoscopy;  Laterality: N/A;  . 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 [place]; 1999; 2005   "right"  . MULTIPLE TOOTH EXTRACTIONS    . TONSILLECTOMY    . [place]?; 1994; 2005    OB History    No data available       Home Medications    Prior to Admission medications   Medication Sig Start Date End Date Taking? Authorizing Provider  ALPRAZolam (XANAX) 0.25 MG tablet Take 0.25 mg by mouth daily as needed (FOR SVT).   Yes Historical Provider, MD  aspirin EC 81 MG tablet Take 81 mg by mouth every morning.    Yes Historical Provider, MD  azaTHIOprine (IMURAN) 50 MG tablet Take 100 mg by mouth daily.    Yes Historical Provider, MD  colchicine 0.6 MG tablet Take 0.6 mg by mouth daily as needed (For gout.).   Yes Historical Provider, MD  metoCLOPramide (REGLAN) 5 MG tablet TAKE 1 TABLET BY MOUTH AT BEDTIME AND MAY REPEAT DOSE ONE TIME IF NEEDED Patient taking differently: Take 5 mg by mouth twice daily 10/14/15  Yes Milus Banister, MD  Morphine-Naltrexone (EMBEDA) 20-0.8 MG CPCR Take 1 capsule by mouth daily as needed (for pain).    Yes Historical Provider, MD  omeprazole (PRILOSEC) 40 MG capsule Take 40 mg by mouth 2 (two) times daily.    Yes Historical Provider, MD  oxyCODONE-acetaminophen (PERCOCET) 10-325 MG tablet Take 1 tablet by mouth every 6 (six) hours as needed for pain.   Yes Historical Provider, MD  predniSONE (DELTASONE) 5 MG tablet Take 5 mg by mouth daily. 06/22/11  Yes [name], MD  Prenatal Vit-Fe Fumarate-FA (PRENATAL VITAMIN PO) Take 1 tablet by mouth daily.    Yes Historical Provider, MD  promethazine (PHENERGAN) 25 MG tablet Take 1 tablet (25 mg  total) by mouth every 6 (six) hours as needed for nausea or vomiting. 06/18/15  Yes Antonietta Breach, PA-C  tacrolimus (PROGRAF) 1 MG capsule Take 3 mg by mouth 2 (two) times daily.  06/22/11  Yes [name], MD  zolpidem (AMBIEN) 10 MG tablet Take 10 mg by mouth at bedtime as needed for sleep.   Yes Historical Provider, MD  amoxicillin-clavulanate (AUGMENTIN) 500-125 MG tablet Take 1 tablet (500 mg total) by mouth daily. Patient not taking: Reported on 10/24/2015 10/03/15   Jonetta Osgood, MD   Family History Family History  Problem Relation Age of Onset  . Glaucoma Mother   . Multiple sclerosis Brother   . Hypertension Maternal Grandmother     Social History Social History  Substance Use Topics  . Smoking status: Current Every Day  Smoker    Packs/day: 0.20    Years: 28.00    Types: Cigarettes  . Smokeless tobacco: Never Used     Comment: had quit 03/05/2011  . Alcohol use Yes     Comment: rare     Allergies   Levofloxacin    Review of Systems Review of Systems  All other systems reviewed and are negative.   Physical Exam Updated Vital Signs BP 94/66 (BP Location: Right Arm)   Pulse 63   Temp 98.2 F (36.8 C) (Oral)   Resp 18   SpO2 100%   Physical Exam  Constitutional: She is oriented to person, place, and time. She appears well-developed and well-nourished.  HENT:  Head: Normocephalic and atraumatic.  Eyes: Conjunctivae are normal. Pupils are equal, round, and reactive to light. Right eye exhibits no discharge. Left eye exhibits no discharge. No scleral icterus.  Neck: Normal range of motion. No JVD present. No tracheal deviation present.  Cardiovascular: Normal rate and regular rhythm.   Pulmonary/Chest: Effort normal. No stridor.  Abdominal: Soft. She exhibits no distension and no mass. There is no tenderness. There is no rebound and no guarding. No hernia.  Neurological: She is alert and oriented to person, place, and time. Coordination normal.    Psychiatric: She has a normal mood and affect. Her behavior is normal. Judgment and thought content normal.  Nursing note and vitals reviewed.    ED Treatments / Results  Labs (all labs ordered are listed, but only abnormal results are displayed) Labs Reviewed  Randolm Idol, ED    EKG  EKG Interpretation  Date/Time:  Thursday October 24 2015 13:06:26 EDT Ventricular Rate:  53 PR Interval:    QRS Duration: 85 QT Interval:  473 QTC Calculation: 445 R Axis:   80 Text Interpretation:  Sinus rhythm Borderline low voltage, extremity leads Nonspecific T abnormalities, lateral leads ST elev, probable normal early repol pattern Baseline wander in lead(s) I III aVR aVL aVF V2 V6 No significant change since last tracing Confirmed by Winfred Leeds  MD, SAM [phone]) on 10/24/2015 1:12:09 PM       Radiology Ct Chest Wo Contrast  Result Date: 10/24/2015 CLINICAL DATA:  Pt returned after waiting 7+ hours last night, continues to complain of weakness-- states "had a gastroparesis attack and had been vomiting" Chest pain also today Pt is a kidney transplant pt EXAM: CT CHEST WITHOUT CONTRAST TECHNIQUE: Multidetector CT imaging of the chest was performed following the standard protocol without IV contrast. COMPARISON:  09/30/2015 FINDINGS: Cardiovascular: Trace pericardial effusion is noted. Atherosclerotic calcification of the carotid arteries. Superior vena cava stent is again noted. Prominent azygos vein again noted. Mediastinum/Nodes: There is atherosclerotic calcification of the thoracic aorta. Calcified small mediastinal lymph nodes are present. Lungs/Pleura: Minimal scarring identified at the right lung base. Within the left lung apex there is a spiculated mass or area of consolidation. Density now measures cm 2.0 x 1.1, previously measuring 4.2 cm maximum. Findings favor resolving infectious process. Left pleural effusion has resolved. Upper Abdomen: Normal noncontrast appearance of the  visualized portion of the liver. Visualized portion of the stomach is unremarkable. Surgical clips are noted in the upper abdomen. Gallbladder is present. Musculoskeletal: Visualized osseous structures have a normal appearance. IMPRESSION: 1. Significant improvement in left upper lobe opacity, favoring infectious process. Persistent spiculated mass is 2.0 cm. Continued follow-up is recommended to document complete resolution. Consider follow-up CT exam in 3-6 months. 2. 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 3. Interval resolution of left pleural effusion. 4. Visualized portion of the stomach is normal in appearance. 5. Trace pericardial effusion. Electronically Signed   By: Nolon Nations M.D.   On: 10/24/2015 12:57    Procedures Procedures (including critical care time)  Medications Ordered in ED Medications  sodium chloride 0.9 % bolus 1,000 mL (0 mLs Intravenous Stopped 10/24/15 1645)  morphine 4 MG/ML injection 4 mg (4 mg Intravenous Given 10/24/15 1606)     Initial Impression / Assessment and Plan / ED Course  I have reviewed the triage vital signs and the nursing notes.  Pertinent labs & imaging results that were available during my care of the patient were reviewed by me and considered in my medical decision making (see chart for details).  Clinical Course    Labs:Labs completed day prior to evaluation  Imaging:  Consults:  Therapeutics:  Discharge Meds:   Assessment/Plan:   50 year old female presents today with weakness. Patient reports gastroparesis Attack with dehydration. Tolerating by mouth. Patient was given IV fluids here in the ED, tolerating by mouth without difficulty. Reevaluation with CT scan shows improvement in left upper lobe opacity but persistent spiculated mass. Patient was informed of all CT scan findings, she is encouraged follow-up to primary care provider tomorrow for  reevaluation further management of her ongoing symptoms. Patient does not have any shortness of breath, fever, cough. Patient assured she would follow up with primary care inform them of today's visit and all relevant data. She had no further questions or concerns at time of discharge.  Vision had labs the evening before her morning presentation to the emergency room. Creatinine 2.30 consistent with her baseline.       Final Clinical Impressions(s) / ED Diagnoses   Final diagnoses:  Weakness    New Prescriptions Discharge Medication List as of 10/24/2015  5:10 PM       Okey Regal, PA-C 10/24/15 1841    Okey Regal, PA-C 10/24/15 1843    Orlie Dakin, MD 10/25/15 1652

## 2015-10-24 NOTE — Discharge Instructions (Signed)
Please read attached information. If you experience any new or worsening signs or symptoms please return to the emergency room for evaluation. Please follow-up with your primary care provider or specialist as discussed. Please use medication prescribed only as directed and discontinue taking if you have any concerning signs or symptoms.   °

## 2015-10-31 ENCOUNTER — Ambulatory Visit
Admission: RE | Admit: 2015-10-31 | Discharge: 2015-10-31 | Disposition: A | Discharge status: Home / Self Care | Payer: Medicaid Other | Source: Ambulatory Visit | Attending: Anesthesiology | Admitting: Anesthesiology

## 2015-10-31 ENCOUNTER — Other Ambulatory Visit: Payer: Self-pay | Admitting: Anesthesiology

## 2015-10-31 ENCOUNTER — Other Ambulatory Visit: Payer: Self-pay | Admitting: Nephrology

## 2015-10-31 DIAGNOSIS — M542 Cervicalgia: Secondary | ICD-10-CM

## 2015-10-31 DIAGNOSIS — J189 Pneumonia, unspecified organism: Secondary | ICD-10-CM

## 2015-11-04 ENCOUNTER — Inpatient Hospital Stay: Admission: RE | Admit: 2015-11-04 | Payer: Medicaid Other | Source: Ambulatory Visit

## 2015-11-07 ENCOUNTER — Ambulatory Visit
Admission: RE | Admit: 2015-11-07 | Discharge: 2015-11-07 | Disposition: A | Discharge status: Home / Self Care | Payer: Medicaid Other | Source: Ambulatory Visit | Attending: Nephrology | Admitting: Nephrology

## 2015-11-07 DIAGNOSIS — J189 Pneumonia, unspecified organism: Secondary | ICD-10-CM

## 2015-11-08 ENCOUNTER — Encounter (HOSPITAL_COMMUNITY)
Admission: RE | Admit: 2015-11-08 | Discharge: 2015-11-08 | Disposition: A | Discharge status: Home / Self Care | Payer: Medicaid Other | Source: Ambulatory Visit | Attending: Nephrology | Admitting: Nephrology

## 2015-11-08 DIAGNOSIS — D638 Anemia in other chronic diseases classified elsewhere: Secondary | ICD-10-CM | POA: Diagnosis present

## 2015-11-08 DIAGNOSIS — N185 Chronic kidney disease, stage 5: Secondary | ICD-10-CM | POA: Diagnosis not present

## 2015-11-08 DIAGNOSIS — D631 Anemia in chronic kidney disease: Secondary | ICD-10-CM | POA: Insufficient documentation

## 2015-11-08 DIAGNOSIS — Z940 Kidney transplant status: Secondary | ICD-10-CM | POA: Diagnosis not present

## 2015-11-08 LAB — RENAL FUNCTION PANEL
Albumin: 3.8 g/dL (ref 3.5–5.0)
Anion gap: 8 (ref 5–15)
BUN: 32 mg/dL — ABNORMAL HIGH (ref 6–20)
CO2: 25 mmol/L (ref 22–32)
Calcium: 9.4 mg/dL (ref 8.9–10.3)
Chloride: 106 mmol/L (ref 101–111)
Creatinine, Ser: 2.06 mg/dL — ABNORMAL HIGH (ref 0.44–1.00)
GFR calc Af Amer: 32 mL/min — ABNORMAL LOW (ref >60)
GFR calc non Af Amer: 27 mL/min — ABNORMAL LOW (ref >60)
Glucose, Bld: 90 mg/dL (ref 65–99)
Phosphorus: 4 mg/dL (ref 2.5–4.6)
Potassium: 5.2 mmol/L — ABNORMAL HIGH (ref 3.5–5.1)
Sodium: 139 mmol/L (ref 135–145)

## 2015-11-08 LAB — POCT HEMOGLOBIN-HEMACUE: Hemoglobin: 8.3 g/dL — ABNORMAL LOW (ref 12.0–15.0)

## 2015-11-08 MED ORDER — EPOETIN ALFA 20000 UNIT/ML IJ SOLN: 40000.0000 [IU] | INTRAMUSCULAR | Status: DC

## 2015-11-08 MED ORDER — EPOETIN ALFA 40000 UNIT/ML IJ SOLN
INTRAMUSCULAR | Status: AC
  Administered 2015-11-08: 40000 [IU]
  Filled 2015-11-08: qty 1

## 2015-11-10 LAB — TACROLIMUS LEVEL: Tacrolimus (FK506) - LabCorp: 8.8 ng/mL (ref 2.0–20.0)

## 2015-12-05 ENCOUNTER — Other Ambulatory Visit (HOSPITAL_COMMUNITY): Payer: Self-pay | Admitting: *Deleted

## 2015-12-06 ENCOUNTER — Encounter (HOSPITAL_COMMUNITY): Payer: Medicaid Other

## 2015-12-16 ENCOUNTER — Encounter (HOSPITAL_COMMUNITY)
Admission: RE | Admit: 2015-12-16 | Discharge: 2015-12-16 | Disposition: A | Discharge status: Home / Self Care | Payer: Medicaid Other | Source: Ambulatory Visit | Attending: Nephrology | Admitting: Nephrology

## 2015-12-16 ENCOUNTER — Encounter (HOSPITAL_COMMUNITY): Payer: Medicaid Other

## 2015-12-16 DIAGNOSIS — D631 Anemia in chronic kidney disease: Secondary | ICD-10-CM | POA: Diagnosis not present

## 2015-12-16 DIAGNOSIS — N185 Chronic kidney disease, stage 5: Secondary | ICD-10-CM | POA: Insufficient documentation

## 2015-12-16 DIAGNOSIS — Z940 Kidney transplant status: Secondary | ICD-10-CM | POA: Diagnosis not present

## 2015-12-16 DIAGNOSIS — D638 Anemia in other chronic diseases classified elsewhere: Secondary | ICD-10-CM | POA: Diagnosis present

## 2015-12-16 LAB — RENAL FUNCTION PANEL
Albumin: 4.4 g/dL (ref 3.5–5.0)
Anion gap: 9 (ref 5–15)
BUN: 27 mg/dL — ABNORMAL HIGH (ref 6–20)
CO2: 27 mmol/L (ref 22–32)
Calcium: 9.9 mg/dL (ref 8.9–10.3)
Chloride: 105 mmol/L (ref 101–111)
Creatinine, Ser: 2.06 mg/dL — ABNORMAL HIGH (ref 0.44–1.00)
GFR calc Af Amer: 32 mL/min — ABNORMAL LOW (ref >60)
GFR calc non Af Amer: 27 mL/min — ABNORMAL LOW (ref >60)
Glucose, Bld: 89 mg/dL (ref 65–99)
Phosphorus: 3.1 mg/dL (ref 2.5–4.6)
Potassium: 4.6 mmol/L (ref 3.5–5.1)
Sodium: 141 mmol/L (ref 135–145)

## 2015-12-16 LAB — IRON AND TIBC
Iron: 65 ug/dL (ref 28–170)
Saturation Ratios: 32 % — ABNORMAL HIGH (ref 10.4–31.8)
TIBC: 200 ug/dL — ABNORMAL LOW (ref 250–450)
UIBC: 135 ug/dL

## 2015-12-16 LAB — FERRITIN: Ferritin: 1089 ng/mL — ABNORMAL HIGH (ref 11–307)

## 2015-12-16 LAB — POCT HEMOGLOBIN-HEMACUE: Hemoglobin: 10 g/dL — ABNORMAL LOW (ref 12.0–15.0)

## 2015-12-16 MED ORDER — DARBEPOETIN ALFA 100 MCG/0.5ML IJ SOSY
PREFILLED_SYRINGE | INTRAMUSCULAR | Status: AC
  Administered 2015-12-16: 100 ug via SUBCUTANEOUS
  Filled 2015-12-16: qty 0.5

## 2015-12-16 MED ORDER — DARBEPOETIN ALFA 100 MCG/0.5ML IJ SOSY
100.0000 ug | PREFILLED_SYRINGE | INTRAMUSCULAR | Status: DC
Start: 2015-12-16 — End: 2015-12-17
  Administered 2015-12-16: 100 ug via SUBCUTANEOUS

## 2015-12-17 LAB — TACROLIMUS LEVEL: Tacrolimus (FK506) - LabCorp: 7.2 ng/mL (ref 2.0–20.0)

## 2016-01-01 IMAGING — CR DG CHEST 2V
2 series · 2 of 2 positions shown · non-contrast
Comparison: Chest radiograph 05/26/2013

CLINICAL DATA: Sore throat, chest pain and fever.  Smoker.

EXAM:
CHEST  2 VIEW

[w chest pa]
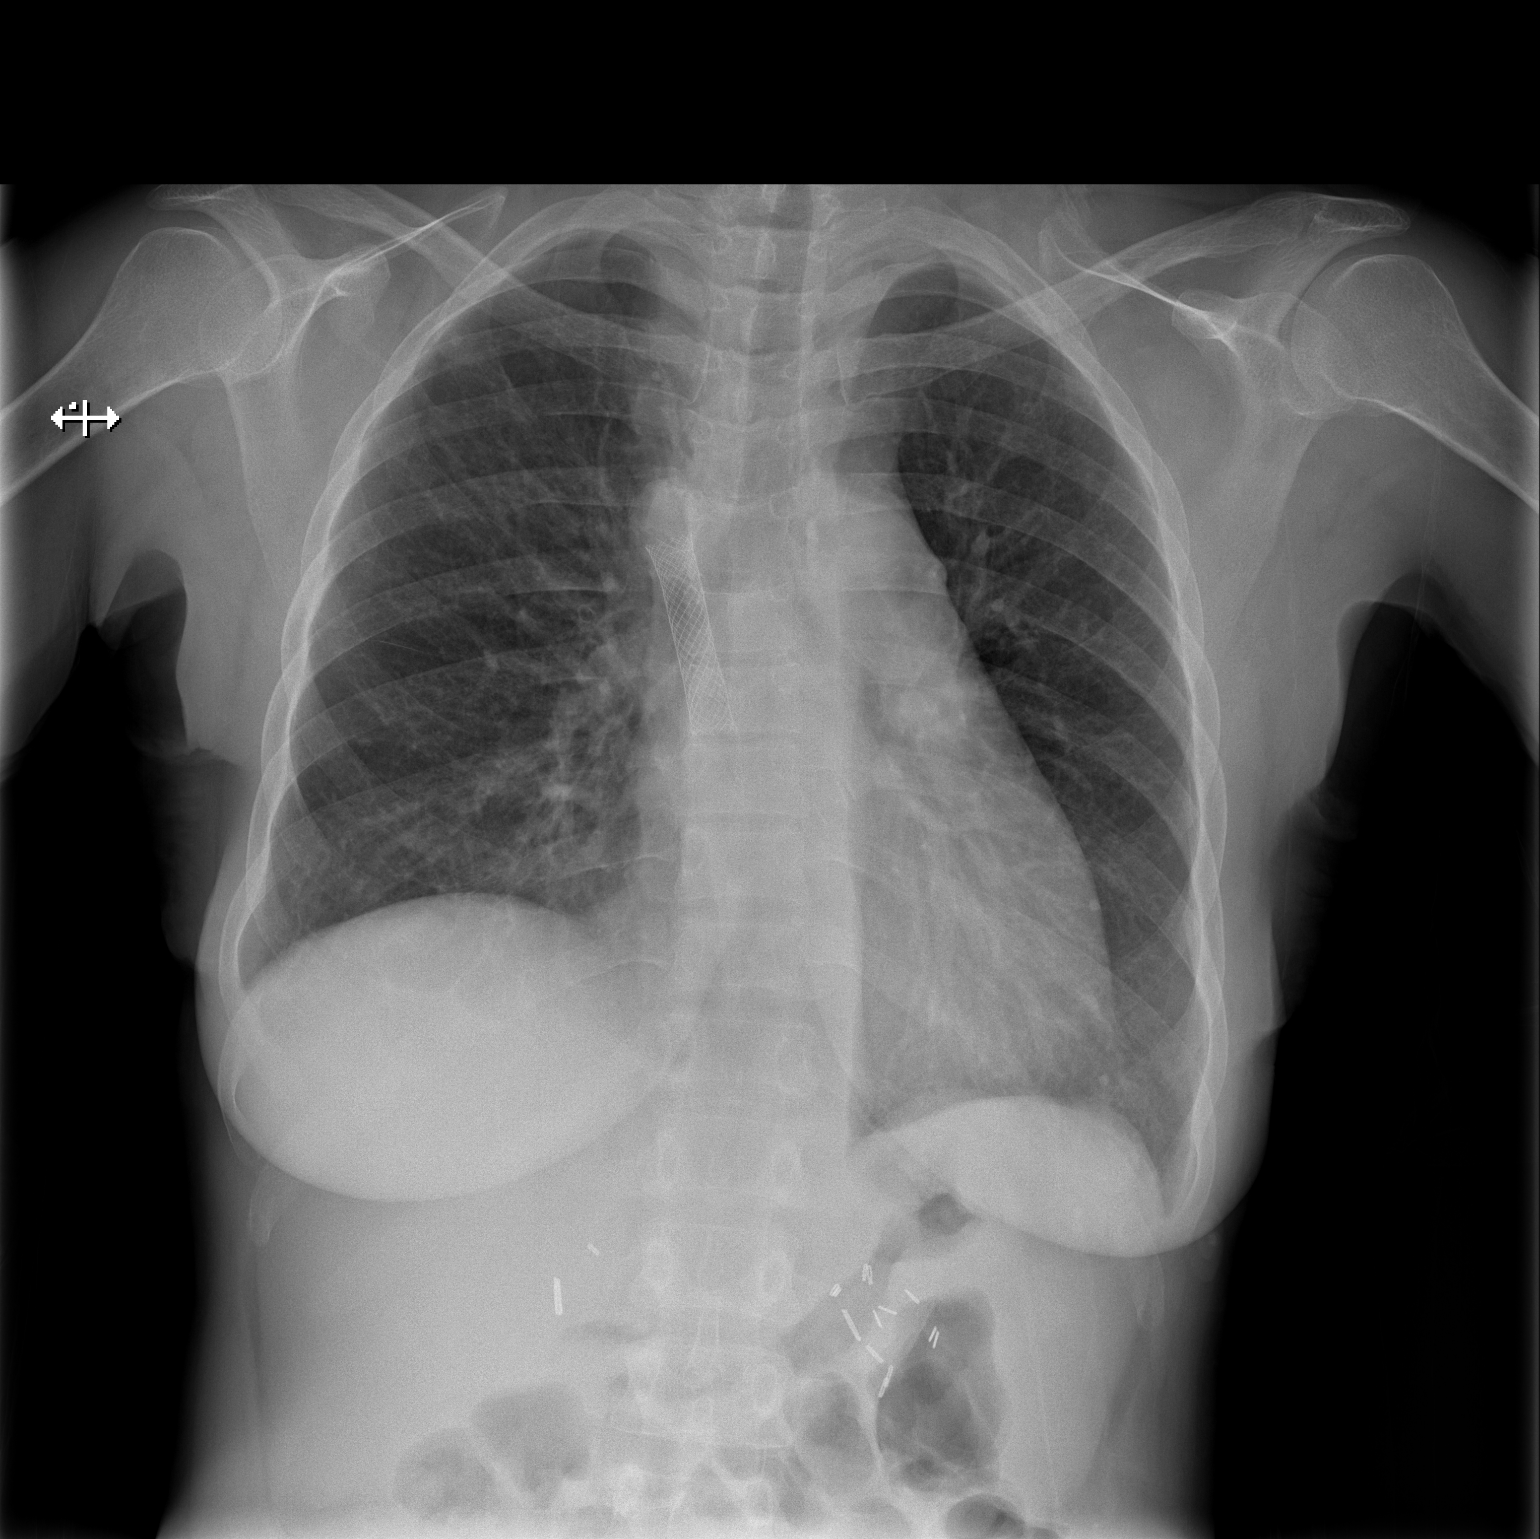

[w chest lat]
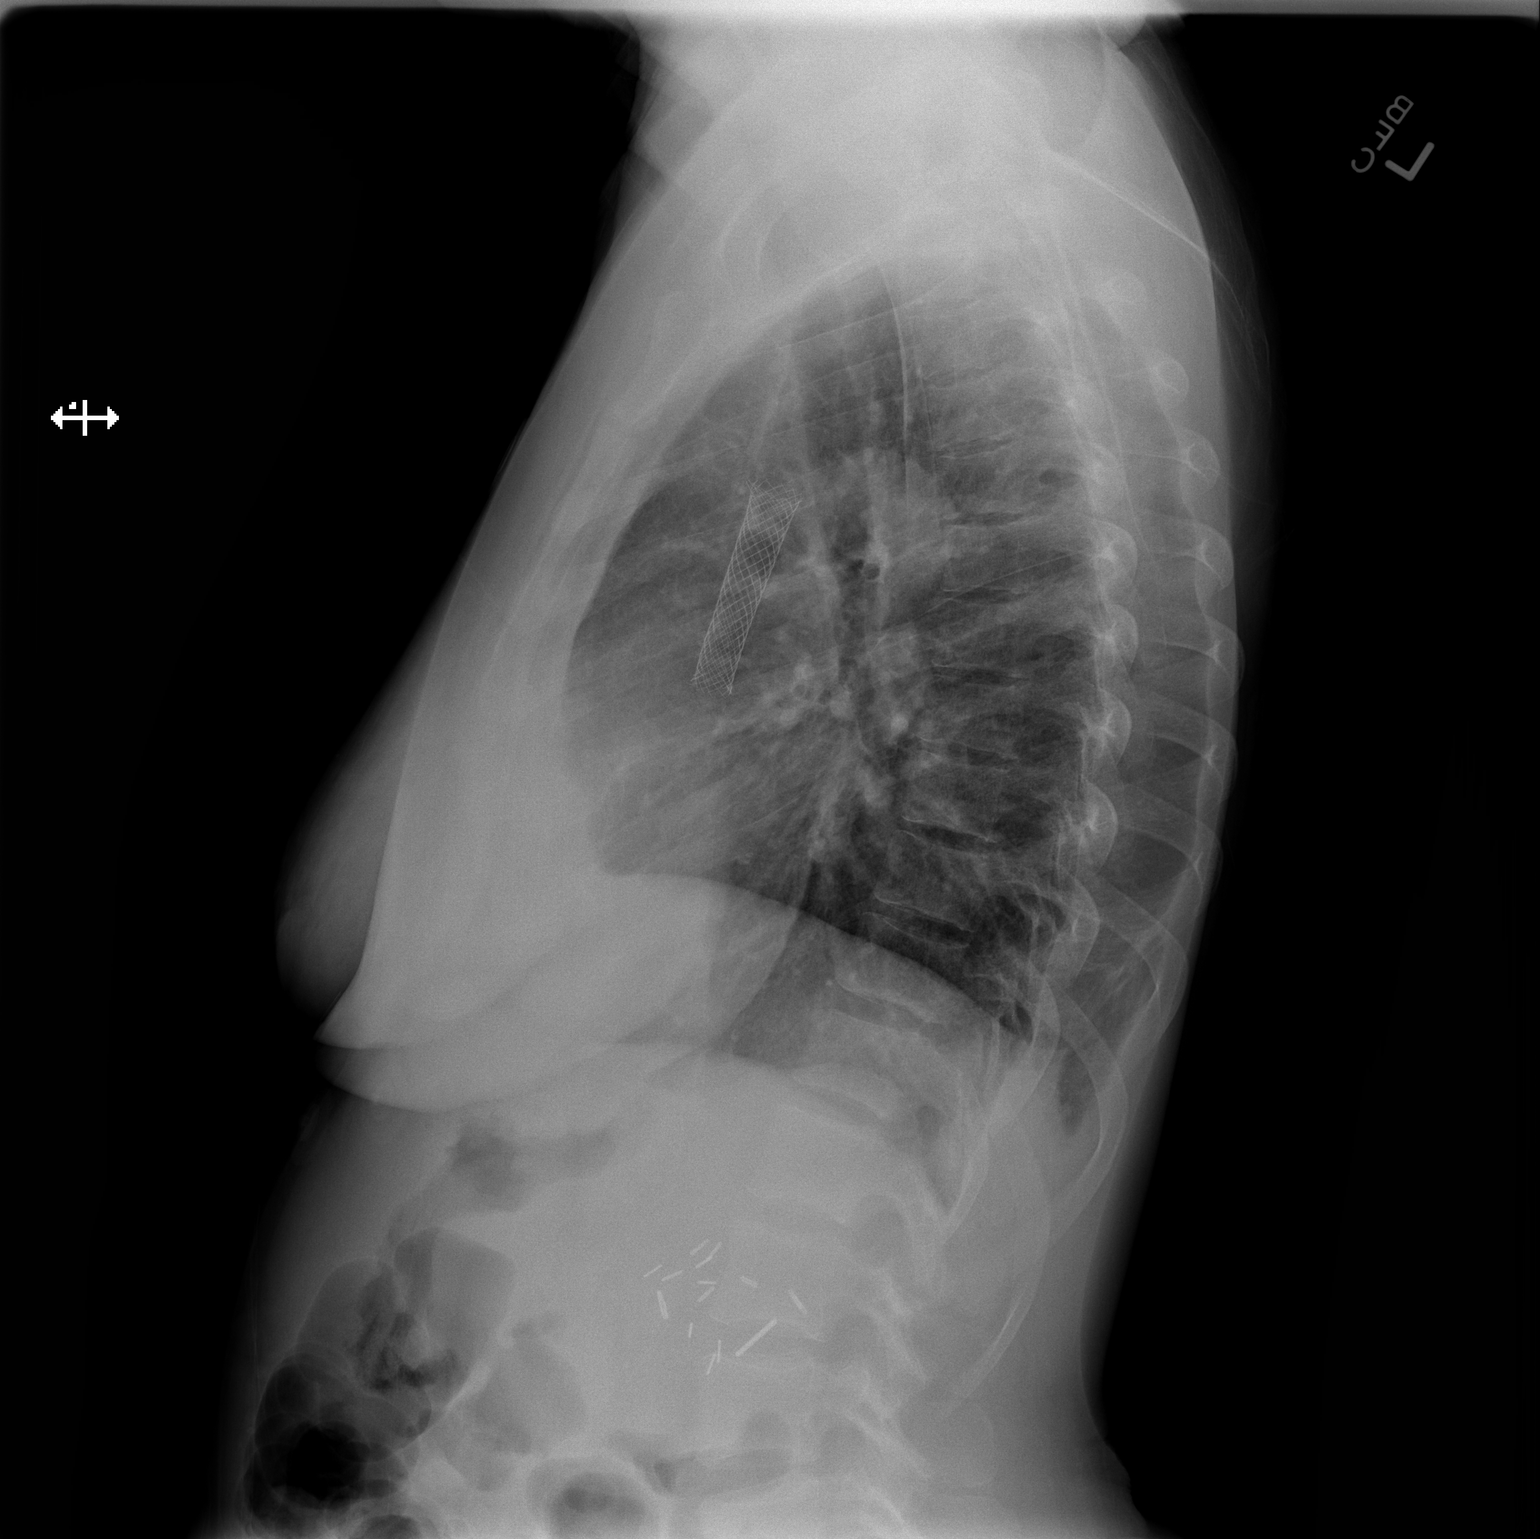

[2 of 2 positions shown; findings below may reference images not displayed]

FINDINGS: A stent projects over the superior vena cava and is stable. Mild
cardiomegaly is stable. Mediastinal and hilar contours are stable.
The trachea is midline. Pulmonary vascularity is mildly congested.
No definite pulmonary edema. No focal consolidation or pleural
effusion. Negative for pneumothorax. There are surgical clips in the
upper abdominal retroperitoneum. Visualized bowel gas pattern
nonobstructive. No acute bony abnormality.
IMPRESSION: Stable mild cardiomegaly with mild pulmonary vascular congestion.
Otherwise, the lungs are clear.

Superior vena cava stent again noted.

## 2016-01-03 ENCOUNTER — Encounter (HOSPITAL_COMMUNITY)
Admission: RE | Admit: 2016-01-03 | Discharge: 2016-01-03 | Disposition: A | Discharge status: Home / Self Care | Payer: Medicaid Other | Source: Ambulatory Visit | Attending: Nephrology | Admitting: Nephrology

## 2016-01-03 DIAGNOSIS — Z940 Kidney transplant status: Secondary | ICD-10-CM | POA: Diagnosis not present

## 2016-01-03 DIAGNOSIS — D638 Anemia in other chronic diseases classified elsewhere: Secondary | ICD-10-CM | POA: Diagnosis present

## 2016-01-03 DIAGNOSIS — N185 Chronic kidney disease, stage 5: Secondary | ICD-10-CM | POA: Diagnosis not present

## 2016-01-03 DIAGNOSIS — D631 Anemia in chronic kidney disease: Secondary | ICD-10-CM | POA: Diagnosis not present

## 2016-01-03 LAB — POCT HEMOGLOBIN-HEMACUE: Hemoglobin: 9.4 g/dL — ABNORMAL LOW (ref 12.0–15.0)

## 2016-01-03 MED ORDER — DARBEPOETIN ALFA 100 MCG/0.5ML IJ SOSY
PREFILLED_SYRINGE | INTRAMUSCULAR | Status: AC
  Filled 2016-01-03: qty 0.5

## 2016-01-03 MED ORDER — DARBEPOETIN ALFA 100 MCG/0.5ML IJ SOSY
100.0000 ug | PREFILLED_SYRINGE | INTRAMUSCULAR | Status: DC
  Administered 2016-01-03: 100 ug via SUBCUTANEOUS

## 2016-02-07 ENCOUNTER — Encounter (HOSPITAL_COMMUNITY)
Admission: RE | Admit: 2016-02-07 | Discharge: 2016-02-07 | Disposition: A | Discharge status: Home / Self Care | Payer: Medicaid Other | Source: Ambulatory Visit | Attending: Nephrology | Admitting: Nephrology

## 2016-02-07 DIAGNOSIS — D631 Anemia in chronic kidney disease: Secondary | ICD-10-CM | POA: Diagnosis not present

## 2016-02-07 DIAGNOSIS — D638 Anemia in other chronic diseases classified elsewhere: Secondary | ICD-10-CM | POA: Diagnosis present

## 2016-02-07 DIAGNOSIS — Z940 Kidney transplant status: Secondary | ICD-10-CM | POA: Diagnosis not present

## 2016-02-07 DIAGNOSIS — N185 Chronic kidney disease, stage 5: Secondary | ICD-10-CM | POA: Insufficient documentation

## 2016-02-07 LAB — POCT HEMOGLOBIN-HEMACUE: Hemoglobin: 10.6 g/dL — ABNORMAL LOW (ref 12.0–15.0)

## 2016-02-07 LAB — RENAL FUNCTION PANEL
Albumin: 4.1 g/dL (ref 3.5–5.0)
Anion gap: 10 (ref 5–15)
BUN: 43 mg/dL — ABNORMAL HIGH (ref 6–20)
CO2: 24 mmol/L (ref 22–32)
Calcium: 9.9 mg/dL (ref 8.9–10.3)
Chloride: 102 mmol/L (ref 101–111)
Creatinine, Ser: 2.16 mg/dL — ABNORMAL HIGH (ref 0.44–1.00)
GFR calc Af Amer: 30 mL/min — ABNORMAL LOW (ref >60)
GFR calc non Af Amer: 26 mL/min — ABNORMAL LOW (ref >60)
Glucose, Bld: 108 mg/dL — ABNORMAL HIGH (ref 65–99)
Phosphorus: 4.3 mg/dL (ref 2.5–4.6)
Potassium: 5.2 mmol/L — ABNORMAL HIGH (ref 3.5–5.1)
Sodium: 136 mmol/L (ref 135–145)

## 2016-02-07 MED ORDER — DARBEPOETIN ALFA 100 MCG/0.5ML IJ SOSY
100.0000 ug | PREFILLED_SYRINGE | INTRAMUSCULAR | Status: DC
  Administered 2016-02-07: 100 ug via SUBCUTANEOUS

## 2016-02-07 MED ORDER — DARBEPOETIN ALFA 100 MCG/0.5ML IJ SOSY
PREFILLED_SYRINGE | INTRAMUSCULAR | Status: AC
  Filled 2016-02-07: qty 0.5

## 2016-02-08 LAB — TACROLIMUS LEVEL: Tacrolimus (FK506) - LabCorp: 3 ng/mL (ref 2.0–20.0)

## 2016-03-06 ENCOUNTER — Encounter (HOSPITAL_COMMUNITY): Admission: RE | Admit: 2016-03-06 | Payer: Medicaid Other | Source: Ambulatory Visit

## 2016-03-11 ENCOUNTER — Encounter (HOSPITAL_COMMUNITY)
Admission: RE | Admit: 2016-03-11 | Discharge: 2016-03-11 | Disposition: A | Discharge status: Home / Self Care | Payer: Medicaid Other | Source: Ambulatory Visit | Attending: Nephrology | Admitting: Nephrology

## 2016-03-11 DIAGNOSIS — Z940 Kidney transplant status: Secondary | ICD-10-CM | POA: Diagnosis not present

## 2016-03-11 DIAGNOSIS — D631 Anemia in chronic kidney disease: Secondary | ICD-10-CM | POA: Insufficient documentation

## 2016-03-11 DIAGNOSIS — D638 Anemia in other chronic diseases classified elsewhere: Secondary | ICD-10-CM | POA: Diagnosis present

## 2016-03-11 DIAGNOSIS — N185 Chronic kidney disease, stage 5: Secondary | ICD-10-CM | POA: Diagnosis not present

## 2016-03-11 LAB — POCT HEMOGLOBIN-HEMACUE: Hemoglobin: 11.1 g/dL — ABNORMAL LOW (ref 12.0–15.0)

## 2016-03-11 LAB — RENAL FUNCTION PANEL
Albumin: 4 g/dL (ref 3.5–5.0)
Anion gap: 11 (ref 5–15)
BUN: 30 mg/dL — ABNORMAL HIGH (ref 6–20)
CO2: 24 mmol/L (ref 22–32)
Calcium: 9.9 mg/dL (ref 8.9–10.3)
Chloride: 104 mmol/L (ref 101–111)
Creatinine, Ser: 1.99 mg/dL — ABNORMAL HIGH (ref 0.44–1.00)
GFR calc Af Amer: 33 mL/min — ABNORMAL LOW (ref >60)
GFR calc non Af Amer: 29 mL/min — ABNORMAL LOW (ref >60)
Glucose, Bld: 85 mg/dL (ref 65–99)
Phosphorus: 2.9 mg/dL (ref 2.5–4.6)
Potassium: 4.3 mmol/L (ref 3.5–5.1)
Sodium: 139 mmol/L (ref 135–145)

## 2016-03-11 LAB — IRON AND TIBC
Iron: 107 ug/dL (ref 28–170)
Saturation Ratios: 51 % — ABNORMAL HIGH (ref 10.4–31.8)
TIBC: 210 ug/dL — ABNORMAL LOW (ref 250–450)
UIBC: 103 ug/dL

## 2016-03-11 LAB — FERRITIN: Ferritin: 1068 ng/mL — ABNORMAL HIGH (ref 11–307)

## 2016-03-11 MED ORDER — DARBEPOETIN ALFA 100 MCG/0.5ML IJ SOSY
100.0000 ug | PREFILLED_SYRINGE | INTRAMUSCULAR | Status: DC
  Administered 2016-03-11: 100 ug via SUBCUTANEOUS

## 2016-03-11 MED ORDER — DARBEPOETIN ALFA 100 MCG/0.5ML IJ SOSY
PREFILLED_SYRINGE | INTRAMUSCULAR | Status: AC
  Filled 2016-03-11: qty 0.5

## 2016-03-12 LAB — TACROLIMUS LEVEL: Tacrolimus (FK506) - LabCorp: 8.5 ng/mL (ref 2.0–20.0)

## 2016-04-02 ENCOUNTER — Other Ambulatory Visit (HOSPITAL_COMMUNITY): Payer: Self-pay | Admitting: *Deleted

## 2016-04-03 ENCOUNTER — Encounter (HOSPITAL_COMMUNITY)
Admission: RE | Admit: 2016-04-03 | Discharge: 2016-04-03 | Disposition: A | Discharge status: Home / Self Care | Payer: Medicaid Other | Source: Ambulatory Visit | Attending: Nephrology | Admitting: Nephrology

## 2016-04-03 DIAGNOSIS — Z940 Kidney transplant status: Secondary | ICD-10-CM | POA: Insufficient documentation

## 2016-04-03 DIAGNOSIS — N185 Chronic kidney disease, stage 5: Secondary | ICD-10-CM | POA: Insufficient documentation

## 2016-04-03 DIAGNOSIS — D631 Anemia in chronic kidney disease: Secondary | ICD-10-CM | POA: Diagnosis not present

## 2016-04-03 DIAGNOSIS — D638 Anemia in other chronic diseases classified elsewhere: Secondary | ICD-10-CM | POA: Diagnosis present

## 2016-04-03 LAB — POCT HEMOGLOBIN-HEMACUE: Hemoglobin: 10.2 g/dL — ABNORMAL LOW (ref 12.0–15.0)

## 2016-04-03 MED ORDER — DARBEPOETIN ALFA 100 MCG/0.5ML IJ SOSY
PREFILLED_SYRINGE | INTRAMUSCULAR | Status: AC
  Administered 2016-04-03: 100 ug via SUBCUTANEOUS
  Filled 2016-04-03: qty 0.5

## 2016-04-03 MED ORDER — DARBEPOETIN ALFA 100 MCG/0.5ML IJ SOSY: 100.0000 ug | PREFILLED_SYRINGE | INTRAMUSCULAR | Status: DC

## 2016-04-13 IMAGING — DX DG ABDOMEN ACUTE W/ 1V CHEST
3 series · 3 of 3 positions shown · non-contrast
Comparison: 10/14/2013

CLINICAL DATA: Upper abdominal pain with nausea and vomiting for 2
days, initial encounter

EXAM:
ACUTE ABDOMEN SERIES (ABDOMEN 2 VIEW & CHEST 1 VIEW)

[chest pa]
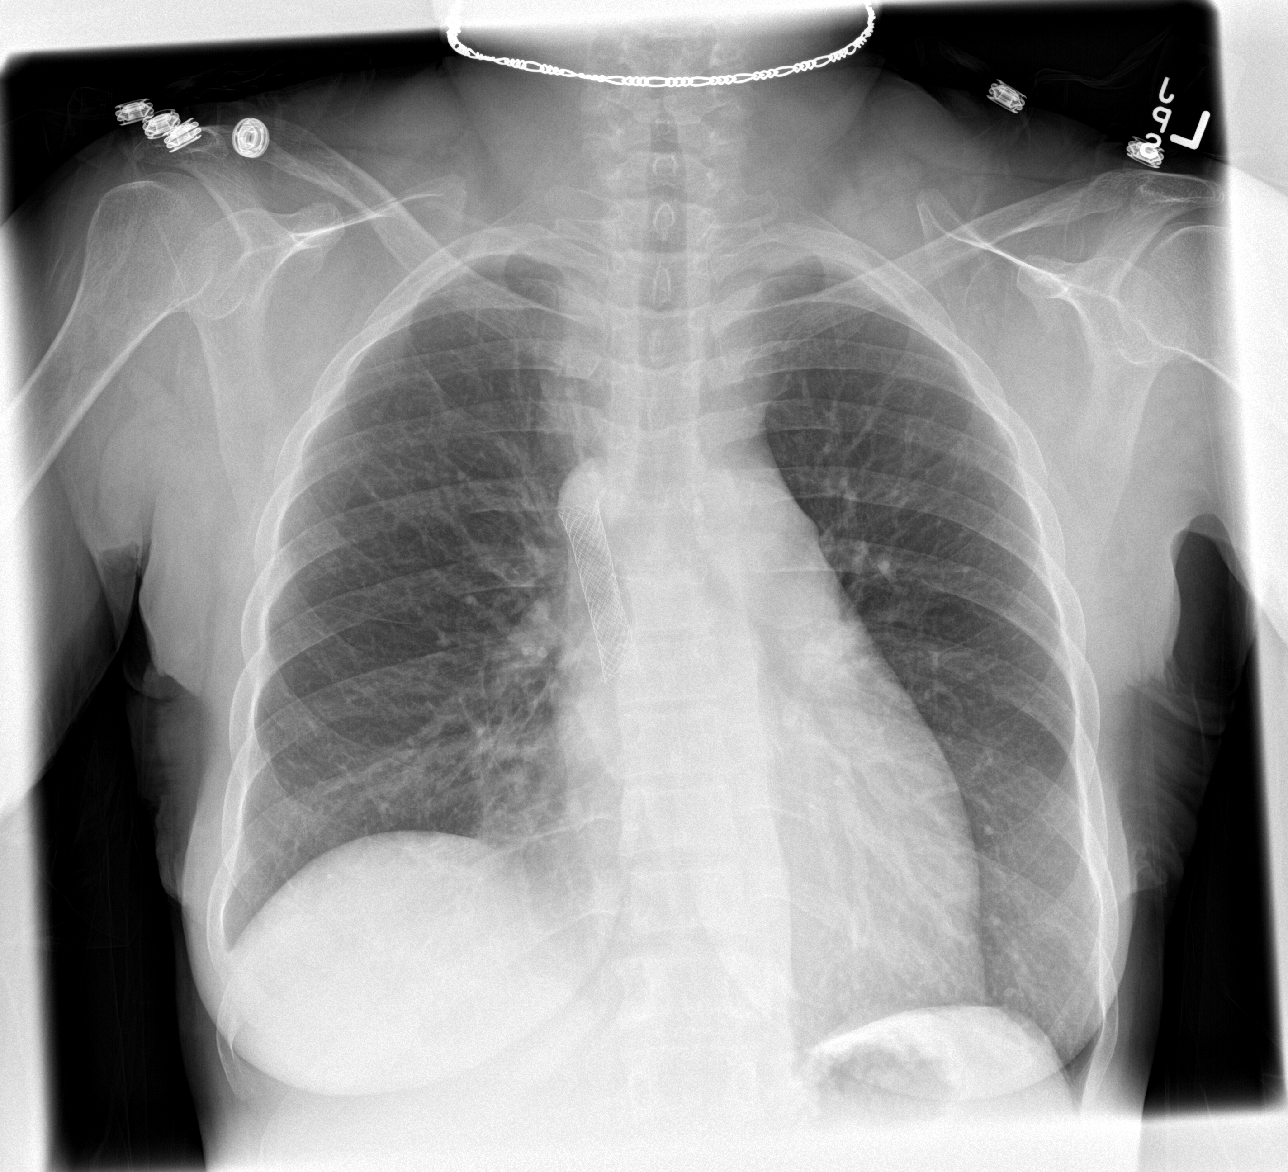

[abdomen erect]
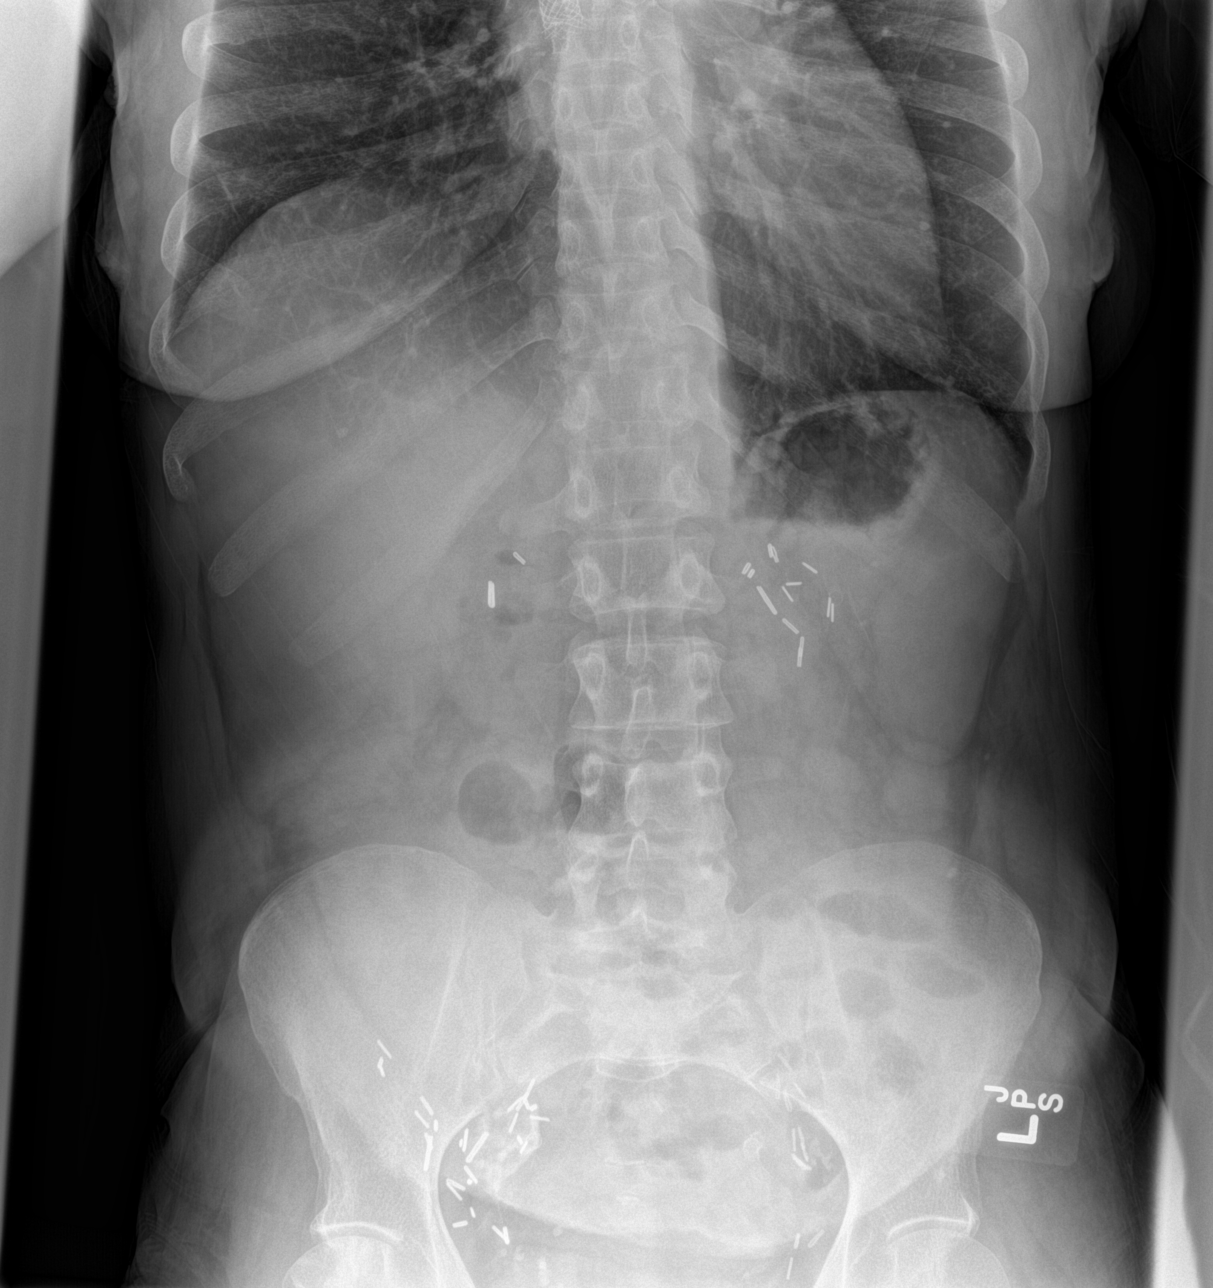

[abdomen supine]
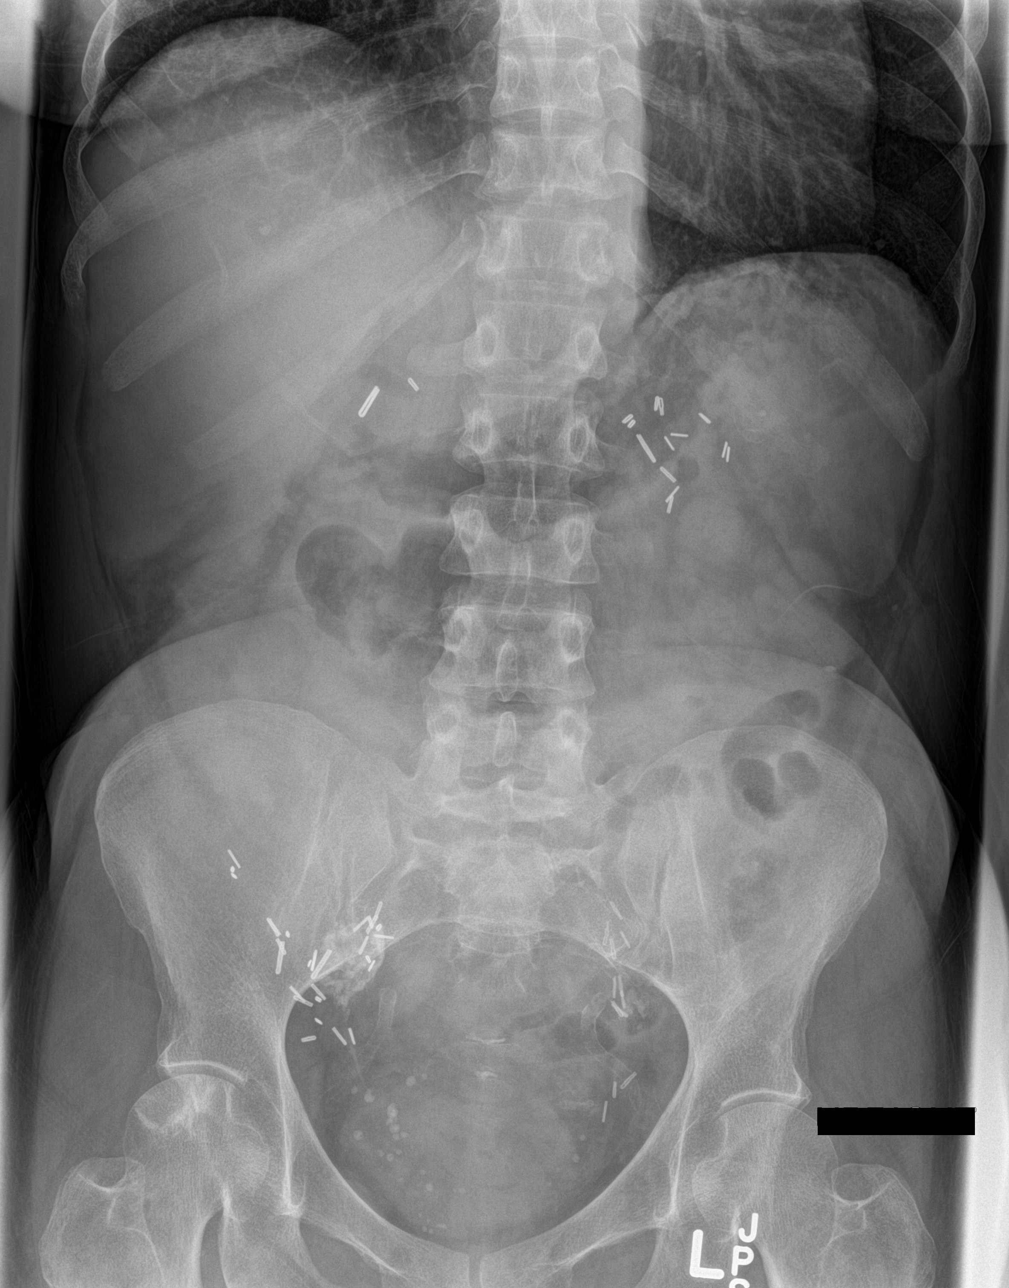

[3 of 3 positions shown; findings below may reference images not displayed]

FINDINGS: Cardiac shadow is stable. A superior vena cava stent is again
identified and unchanged. The lungs are well aerated bilaterally.
Elevation the right hemidiaphragm is again seen.

Postsurgical changes are noted within the abdomen and pelvis. No
free air is seen. No abnormal mass or abnormal calcifications are
noted. A nonobstructive bowel gas pattern is noted. No acute bony
abnormality is seen.
IMPRESSION: No acute abnormality noted.

## 2016-05-08 ENCOUNTER — Encounter (HOSPITAL_COMMUNITY)
Admission: RE | Admit: 2016-05-08 | Discharge: 2016-05-08 | Disposition: A | Discharge status: Home / Self Care | Payer: Medicaid Other | Source: Ambulatory Visit | Attending: Nephrology | Admitting: Nephrology

## 2016-05-08 DIAGNOSIS — D631 Anemia in chronic kidney disease: Secondary | ICD-10-CM | POA: Insufficient documentation

## 2016-05-08 DIAGNOSIS — N185 Chronic kidney disease, stage 5: Secondary | ICD-10-CM | POA: Diagnosis present

## 2016-05-08 DIAGNOSIS — Z940 Kidney transplant status: Secondary | ICD-10-CM | POA: Diagnosis not present

## 2016-05-08 LAB — RENAL FUNCTION PANEL
Albumin: 4.1 g/dL (ref 3.5–5.0)
Anion gap: 7 (ref 5–15)
BUN: 27 mg/dL — ABNORMAL HIGH (ref 6–20)
CO2: 24 mmol/L (ref 22–32)
Calcium: 9.6 mg/dL (ref 8.9–10.3)
Chloride: 110 mmol/L (ref 101–111)
Creatinine, Ser: 2.34 mg/dL — ABNORMAL HIGH (ref 0.44–1.00)
GFR calc Af Amer: 27 mL/min — ABNORMAL LOW (ref >60)
GFR calc non Af Amer: 23 mL/min — ABNORMAL LOW (ref >60)
Glucose, Bld: 91 mg/dL (ref 65–99)
Phosphorus: 3.8 mg/dL (ref 2.5–4.6)
Potassium: 5.2 mmol/L — ABNORMAL HIGH (ref 3.5–5.1)
Sodium: 141 mmol/L (ref 135–145)

## 2016-05-08 LAB — IRON AND TIBC
Iron: 47 ug/dL (ref 28–170)
Saturation Ratios: 23 % (ref 10.4–31.8)
TIBC: 206 ug/dL — ABNORMAL LOW (ref 250–450)
UIBC: 159 ug/dL

## 2016-05-08 LAB — FERRITIN: Ferritin: 1096 ng/mL — ABNORMAL HIGH (ref 11–307)

## 2016-05-08 LAB — POCT HEMOGLOBIN-HEMACUE: Hemoglobin: 10 g/dL — ABNORMAL LOW (ref 12.0–15.0)

## 2016-05-08 MED ORDER — DARBEPOETIN ALFA 100 MCG/0.5ML IJ SOSY
PREFILLED_SYRINGE | INTRAMUSCULAR | Status: AC
  Administered 2016-05-08: 100 ug via SUBCUTANEOUS
  Filled 2016-05-08: qty 0.5

## 2016-05-08 MED ORDER — DARBEPOETIN ALFA 100 MCG/0.5ML IJ SOSY
100.0000 ug | PREFILLED_SYRINGE | INTRAMUSCULAR | Status: DC
  Administered 2016-05-08: 100 ug via SUBCUTANEOUS

## 2016-05-10 LAB — TACROLIMUS LEVEL: Tacrolimus (FK506) - LabCorp: 10.9 ng/mL (ref 2.0–20.0)

## 2016-06-04 ENCOUNTER — Other Ambulatory Visit (HOSPITAL_COMMUNITY): Payer: Self-pay | Admitting: *Deleted

## 2016-06-05 ENCOUNTER — Ambulatory Visit (HOSPITAL_COMMUNITY)
Admission: RE | Admit: 2016-06-05 | Discharge: 2016-06-05 | Disposition: A | Discharge status: Home / Self Care | Payer: Medicaid Other | Source: Ambulatory Visit | Attending: Nephrology | Admitting: Nephrology

## 2016-06-05 DIAGNOSIS — D631 Anemia in chronic kidney disease: Secondary | ICD-10-CM | POA: Diagnosis present

## 2016-06-05 DIAGNOSIS — Z940 Kidney transplant status: Secondary | ICD-10-CM

## 2016-06-05 LAB — RENAL FUNCTION PANEL
Albumin: 4.2 g/dL (ref 3.5–5.0)
Anion gap: 8 (ref 5–15)
BUN: 38 mg/dL — ABNORMAL HIGH (ref 6–20)
CO2: 23 mmol/L (ref 22–32)
Calcium: 9.5 mg/dL (ref 8.9–10.3)
Chloride: 106 mmol/L (ref 101–111)
Creatinine, Ser: 2.53 mg/dL — ABNORMAL HIGH (ref 0.44–1.00)
GFR calc Af Amer: 25 mL/min — ABNORMAL LOW (ref >60)
GFR calc non Af Amer: 21 mL/min — ABNORMAL LOW (ref >60)
Glucose, Bld: 80 mg/dL (ref 65–99)
Phosphorus: 3.5 mg/dL (ref 2.5–4.6)
Potassium: 4.9 mmol/L (ref 3.5–5.1)
Sodium: 137 mmol/L (ref 135–145)

## 2016-06-05 LAB — POCT HEMOGLOBIN-HEMACUE: Hemoglobin: 10.3 g/dL — ABNORMAL LOW (ref 12.0–15.0)

## 2016-06-05 MED ORDER — EPOETIN ALFA 10000 UNIT/ML IJ SOLN
INTRAMUSCULAR | Status: AC
  Administered 2016-06-05: 10000 [IU] via SUBCUTANEOUS
  Filled 2016-06-05: qty 1

## 2016-06-05 MED ORDER — SODIUM CHLORIDE 0.9 % IV SOLN
510.0000 mg | Freq: Once | INTRAVENOUS | Status: AC
  Administered 2016-06-05: 510 mg via INTRAVENOUS
  Filled 2016-06-05: qty 17

## 2016-06-05 MED ORDER — EPOETIN ALFA 20000 UNIT/ML IJ SOLN
INTRAMUSCULAR | Status: AC
  Administered 2016-06-05: 20000 [IU] via SUBCUTANEOUS
  Filled 2016-06-05: qty 1

## 2016-06-05 MED ORDER — EPOETIN ALFA 20000 UNIT/ML IJ SOLN: 30000.0000 [IU] | INTRAMUSCULAR | Status: DC

## 2016-06-05 NOTE — Discharge Instructions (Signed)

## 2016-06-23 IMAGING — CR DG CERVICAL SPINE COMPLETE 4+V
5 series · 5 of 5 positions shown · non-contrast
Comparison: CT 05/02/2009.

CLINICAL DATA: RIGHT shoulder pain radiating to the RIGHT side of
the neck. Symptoms for 5 days. Initial encounter.

EXAM:
CERVICAL SPINE  4+ VIEWS

[w cervical spine lat]
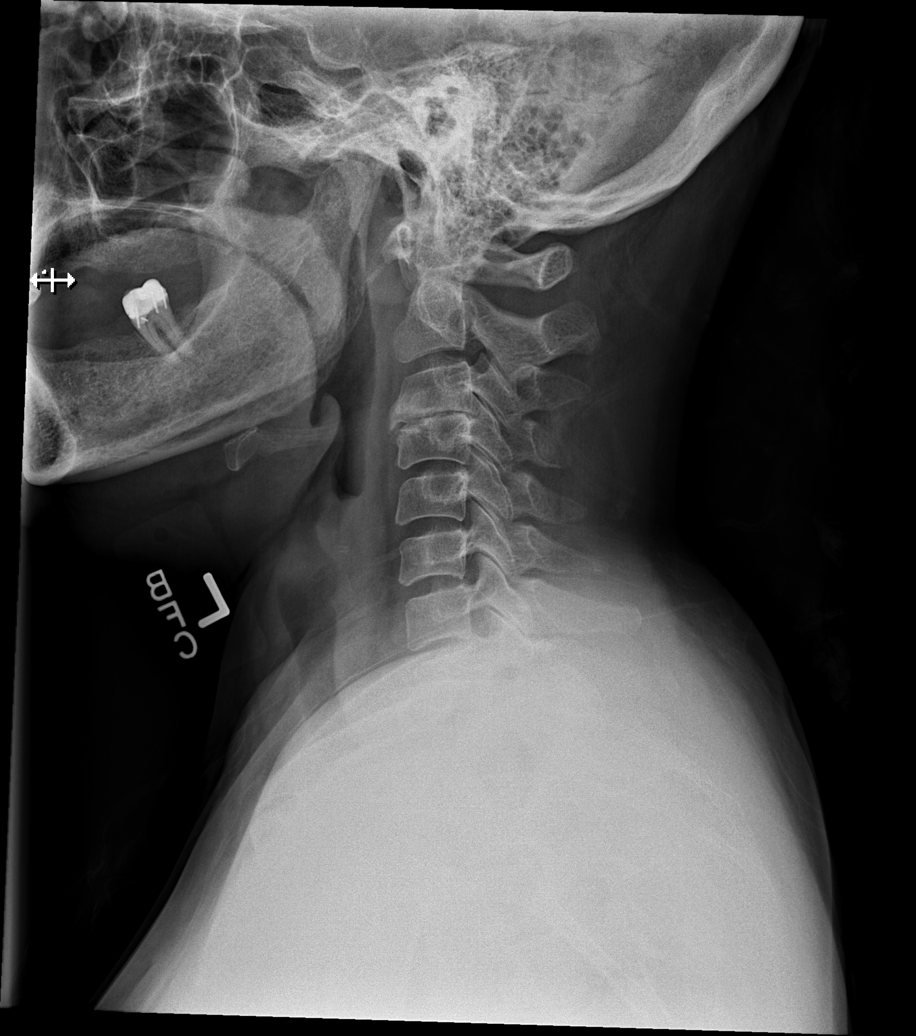

[w cervical spine ap_obl]
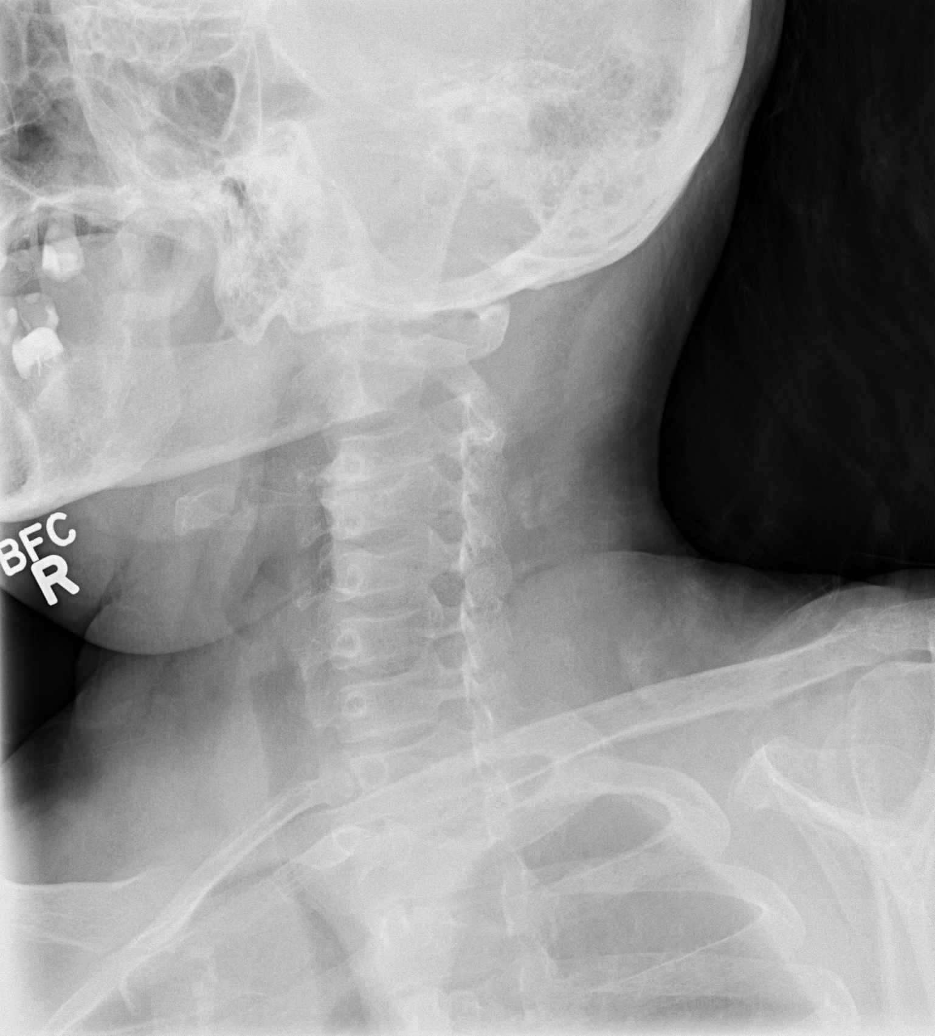

[w cervical spine ap (1 of 2)]
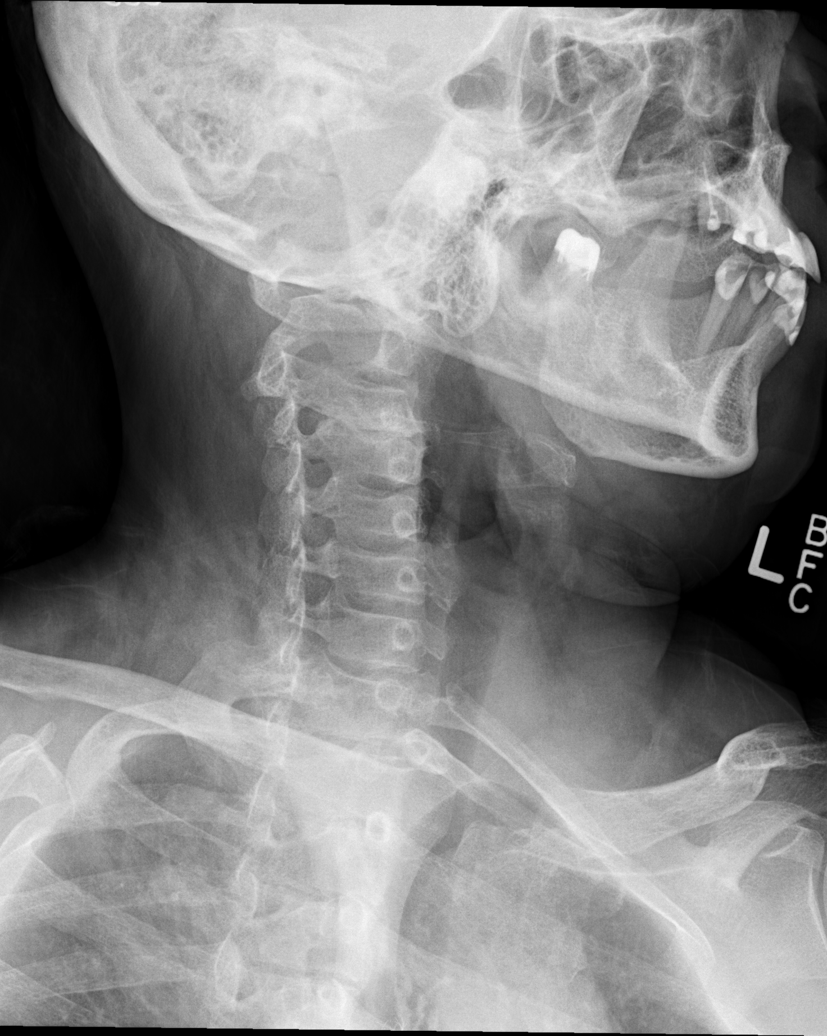

[w cervical spine ap (2 of 2)]
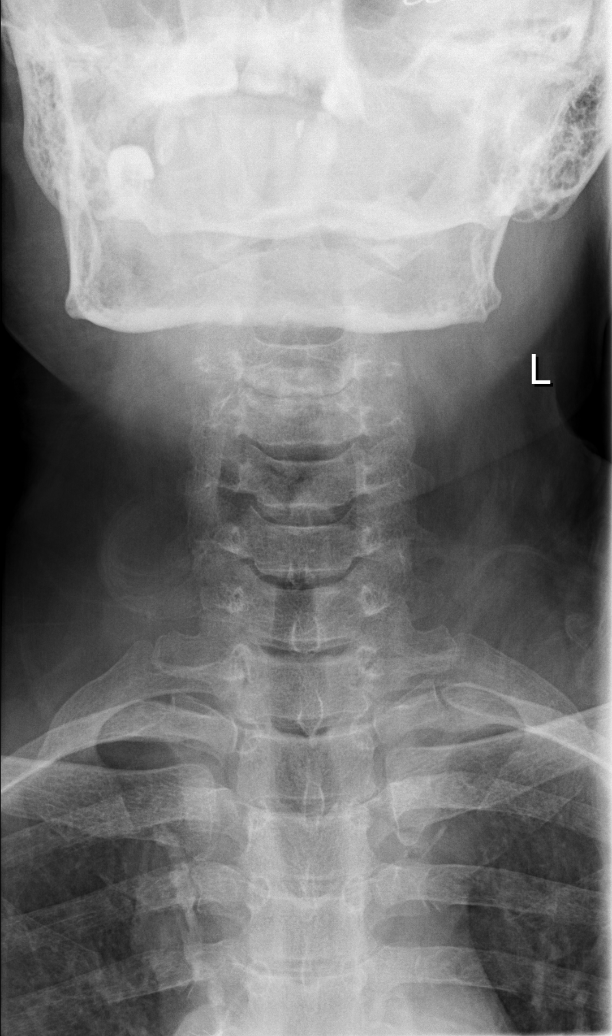

[w cervical spine odontoid]
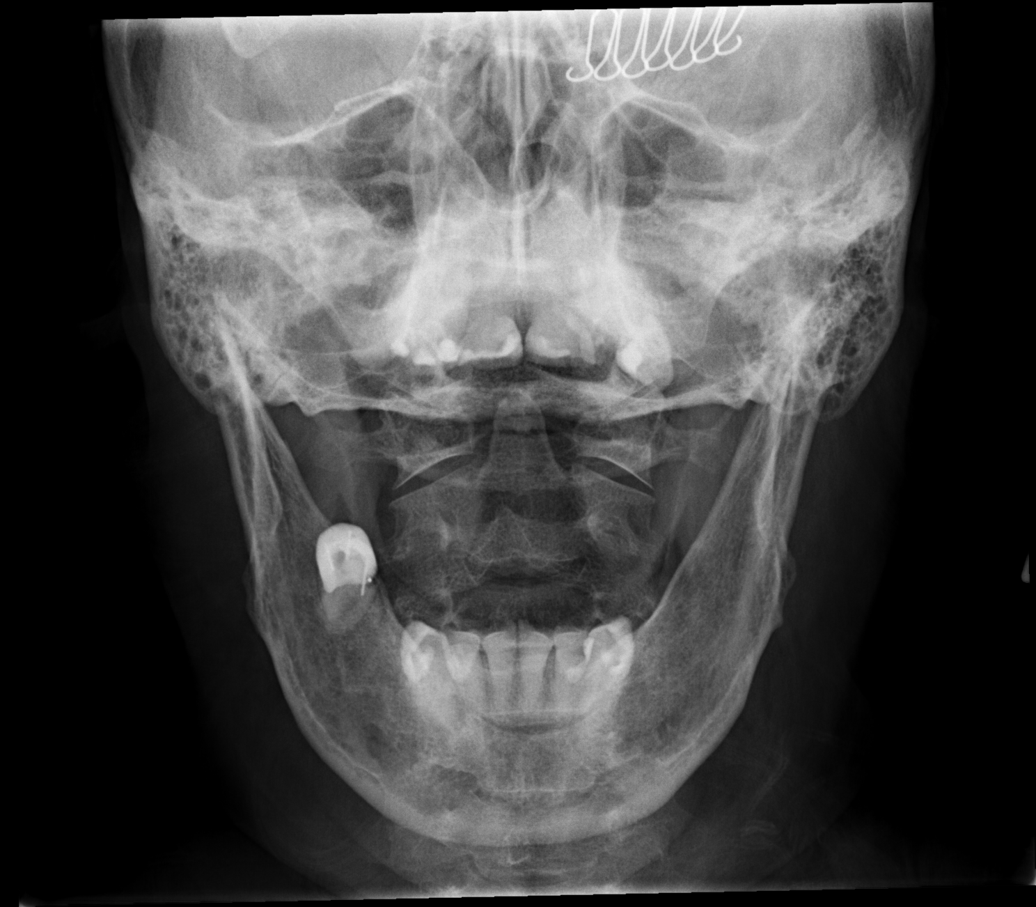

[5 of 5 positions shown; findings below may reference images not displayed]

FINDINGS: Straightening of the normal cervical lordosis. Cervicothoracic
junction appears within normal limits. 1 mm retrolisthesis of C3 on
C4 associated with severe disc space collapse. Degenerative endplate
sclerosis is present at C3-C4. This is an isolated finding with the
other disc spaces preserved. Craniocervical alignment appears
normal. Mild symmetric narrowing of the C3-C4 neural foramina is
present associated with collapse of the disc space. The odontoid is
intact.
IMPRESSION: Severe C3-C4 degenerative disc disease with mild symmetric bilateral
bony foraminal stenosis. No definite change compared to CT 7122. No
acute abnormality.

## 2016-06-23 IMAGING — CR DG SHOULDER 2+V*R*
2 series · 2 of 2 positions shown · non-contrast
Comparison: 10/11/2012

CLINICAL DATA: Right shoulder pain for 5 days without trauma.

EXAM:
RIGHT SHOULDER - 2+ VIEW

[w shoulder external right]
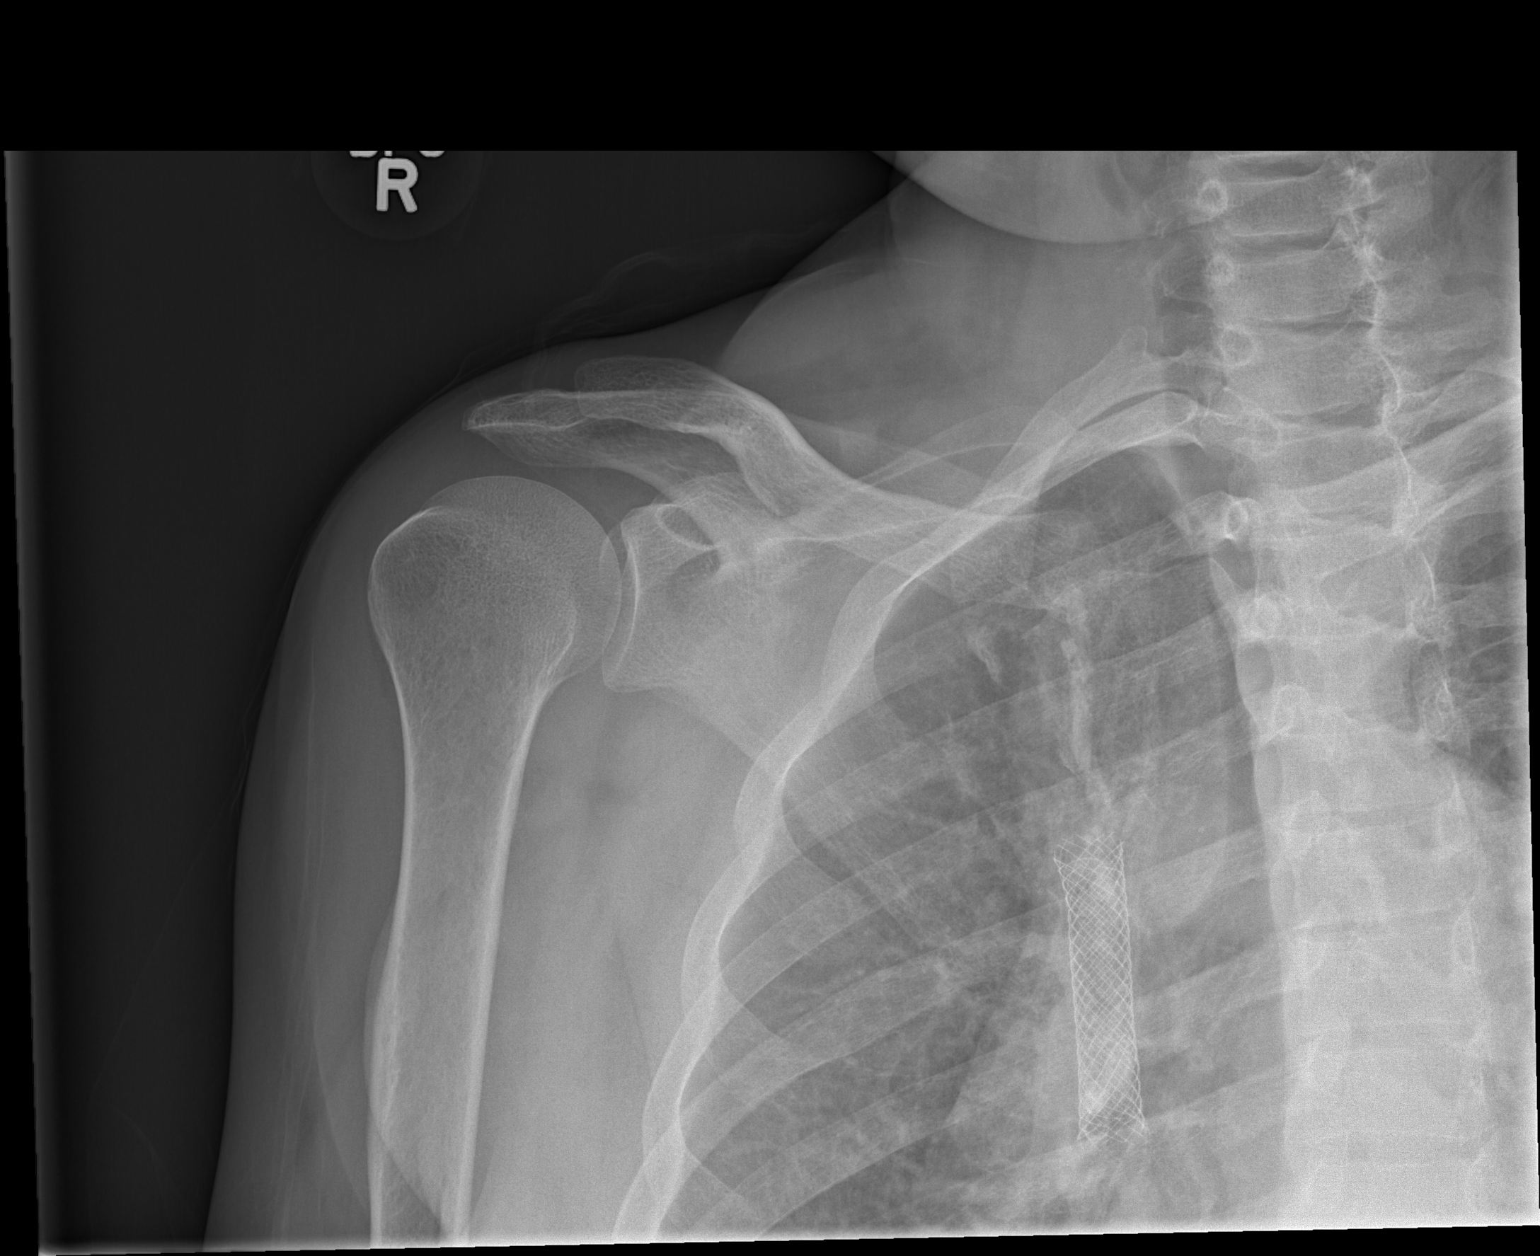

[w shoulder y-view right]
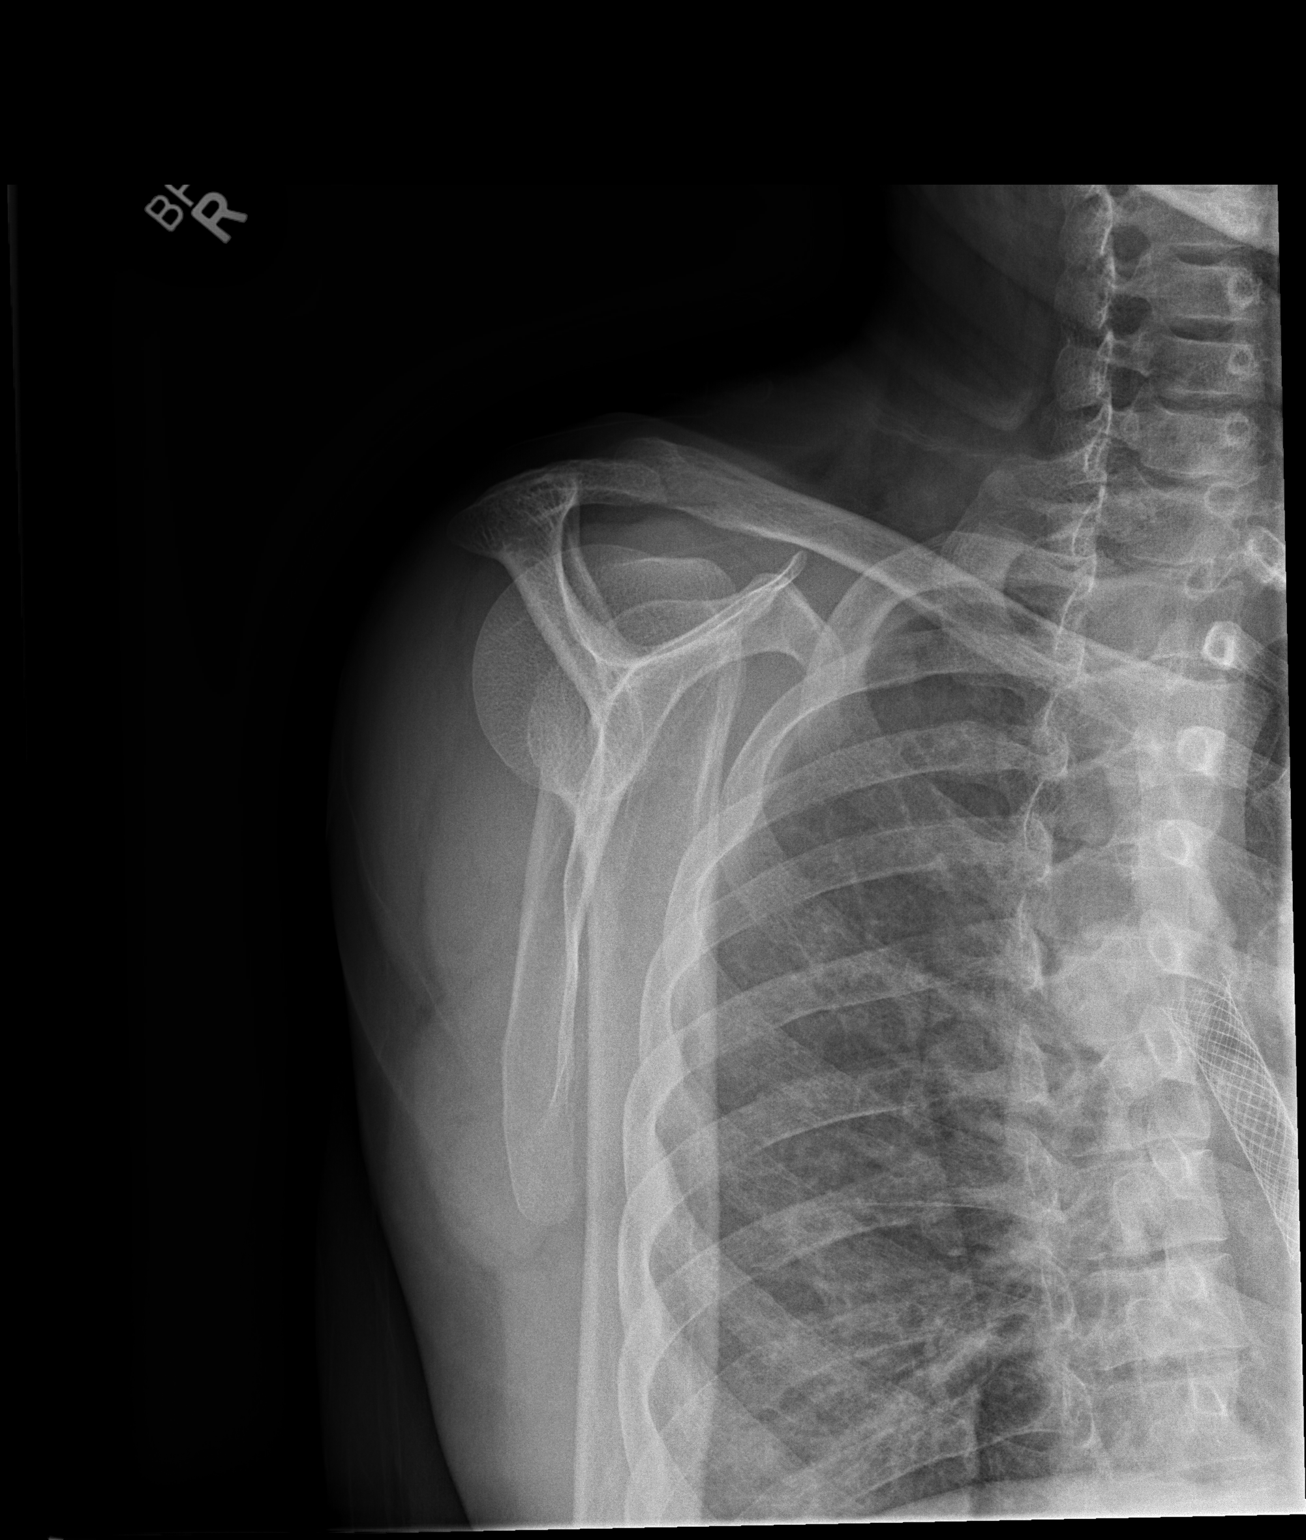

[2 of 2 positions shown; findings below may reference images not displayed]

FINDINGS: AP and scapular views. No acute fracture or dislocation. Joint
spaces maintained. Incompletely imaged right sided vascular stent.
Suspect right upper lobe scarring. Similar.
IMPRESSION: No acute osseous abnormality.

## 2016-07-03 ENCOUNTER — Encounter (HOSPITAL_COMMUNITY)
Admission: RE | Admit: 2016-07-03 | Discharge: 2016-07-03 | Disposition: A | Discharge status: Home / Self Care | Payer: Medicaid Other | Source: Ambulatory Visit | Attending: Nephrology | Admitting: Nephrology

## 2016-07-03 DIAGNOSIS — D638 Anemia in other chronic diseases classified elsewhere: Secondary | ICD-10-CM | POA: Diagnosis present

## 2016-07-03 DIAGNOSIS — N185 Chronic kidney disease, stage 5: Secondary | ICD-10-CM | POA: Insufficient documentation

## 2016-07-03 DIAGNOSIS — D631 Anemia in chronic kidney disease: Secondary | ICD-10-CM | POA: Diagnosis not present

## 2016-07-03 DIAGNOSIS — Z940 Kidney transplant status: Secondary | ICD-10-CM | POA: Diagnosis not present

## 2016-07-03 LAB — RENAL FUNCTION PANEL
Albumin: 4 g/dL (ref 3.5–5.0)
Anion gap: 7 (ref 5–15)
BUN: 37 mg/dL — ABNORMAL HIGH (ref 6–20)
CO2: 25 mmol/L (ref 22–32)
Calcium: 9.6 mg/dL (ref 8.9–10.3)
Chloride: 108 mmol/L (ref 101–111)
Creatinine, Ser: 2.2 mg/dL — ABNORMAL HIGH (ref 0.44–1.00)
GFR calc Af Amer: 29 mL/min — ABNORMAL LOW (ref >60)
GFR calc non Af Amer: 25 mL/min — ABNORMAL LOW (ref >60)
Glucose, Bld: 79 mg/dL (ref 65–99)
Phosphorus: 4.3 mg/dL (ref 2.5–4.6)
Potassium: 4.6 mmol/L (ref 3.5–5.1)
Sodium: 140 mmol/L (ref 135–145)

## 2016-07-03 LAB — IRON AND TIBC
Iron: 68 ug/dL (ref 28–170)
Saturation Ratios: 34 % — ABNORMAL HIGH (ref 10.4–31.8)
TIBC: 197 ug/dL — ABNORMAL LOW (ref 250–450)
UIBC: 129 ug/dL

## 2016-07-03 LAB — FERRITIN: Ferritin: 1278 ng/mL — ABNORMAL HIGH (ref 11–307)

## 2016-07-03 LAB — POCT HEMOGLOBIN-HEMACUE: Hemoglobin: 9 g/dL — ABNORMAL LOW (ref 12.0–15.0)

## 2016-07-03 MED ORDER — EPOETIN ALFA 20000 UNIT/ML IJ SOLN
INTRAMUSCULAR | Status: AC
  Administered 2016-07-03: 20000 [IU] via SUBCUTANEOUS
  Filled 2016-07-03: qty 1

## 2016-07-03 MED ORDER — EPOETIN ALFA 20000 UNIT/ML IJ SOLN: 30000.0000 [IU] | INTRAMUSCULAR | Status: DC

## 2016-07-03 MED ORDER — EPOETIN ALFA 10000 UNIT/ML IJ SOLN
INTRAMUSCULAR | Status: AC
  Administered 2016-07-03: 10000 [IU] via SUBCUTANEOUS
  Filled 2016-07-03: qty 1

## 2016-07-05 LAB — TACROLIMUS LEVEL: Tacrolimus (FK506) - LabCorp: 3.3 ng/mL (ref 2.0–20.0)

## 2016-07-31 ENCOUNTER — Encounter (HOSPITAL_COMMUNITY)
Admission: RE | Admit: 2016-07-31 | Discharge: 2016-07-31 | Disposition: A | Discharge status: Home / Self Care | Payer: Medicaid Other | Source: Ambulatory Visit | Attending: Nephrology | Admitting: Nephrology

## 2016-07-31 DIAGNOSIS — N185 Chronic kidney disease, stage 5: Secondary | ICD-10-CM

## 2016-07-31 DIAGNOSIS — Z940 Kidney transplant status: Secondary | ICD-10-CM

## 2016-07-31 DIAGNOSIS — D631 Anemia in chronic kidney disease: Secondary | ICD-10-CM | POA: Diagnosis not present

## 2016-07-31 LAB — POCT HEMOGLOBIN-HEMACUE: Hemoglobin: 9.7 g/dL — ABNORMAL LOW (ref 12.0–15.0)

## 2016-07-31 MED ORDER — EPOETIN ALFA 20000 UNIT/ML IJ SOLN: 30000.0000 [IU] | INTRAMUSCULAR | Status: DC

## 2016-07-31 MED ORDER — EPOETIN ALFA 20000 UNIT/ML IJ SOLN
INTRAMUSCULAR | Status: AC
  Administered 2016-07-31: 20000 [IU] via SUBCUTANEOUS
  Filled 2016-07-31: qty 1

## 2016-07-31 MED ORDER — EPOETIN ALFA 10000 UNIT/ML IJ SOLN
INTRAMUSCULAR | Status: AC
  Administered 2016-07-31: 10000 [IU] via SUBCUTANEOUS
  Filled 2016-07-31: qty 1

## 2016-08-30 IMAGING — CT CT ABD-PELV W/O CM
2 of 4 series · 15 of 46 positions shown, 17 images · non-contrast
Comparison: Acute abdominal series 455342. CT Abdomen and Pelvis
12/08/2011.

CLINICAL DATA: 48-year-old female with right flank and lower
quadrant pain nausea and vomiting. Initial encounter. Current
history of renal transplant.

EXAM:
CT ABDOMEN AND PELVIS WITHOUT CONTRAST
TECHNIQUE: Multidetector CT imaging of the abdomen and pelvis was performed
following the standard protocol without IV contrast.

[Series 2: abd/ pelvis 5.0 i30f 1 · axial · 0.68mm/px · z∈[-478,-83]mm · 12 of 87 slices shown, 14 images]
[im 4/87  soft-tissue]
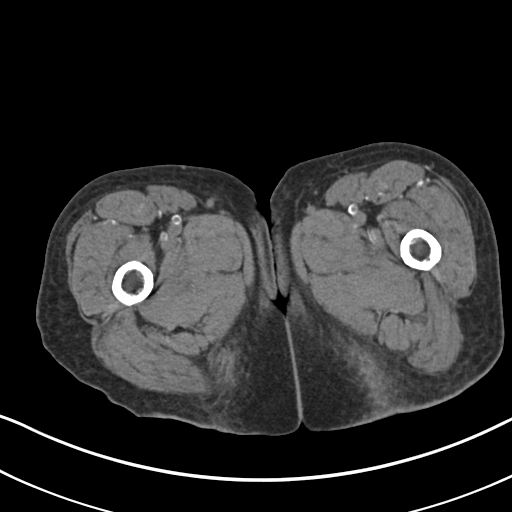
[im 4/87  bone]
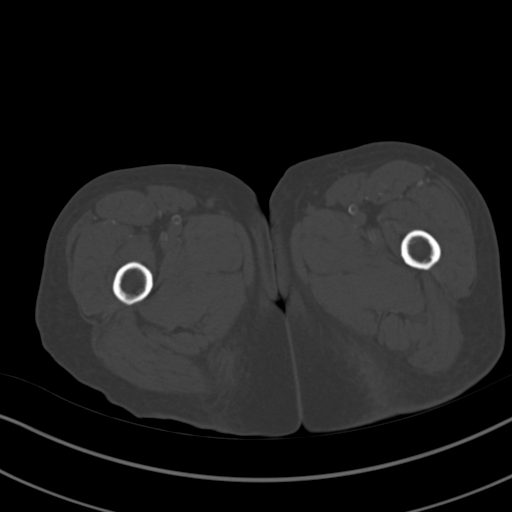
[im 12/87  soft-tissue]
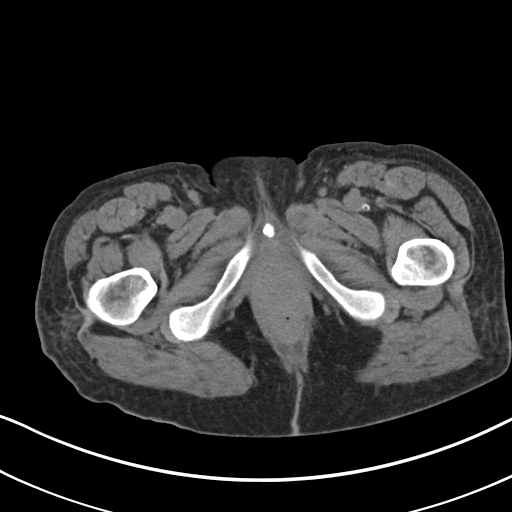
[im 19/87  soft-tissue]
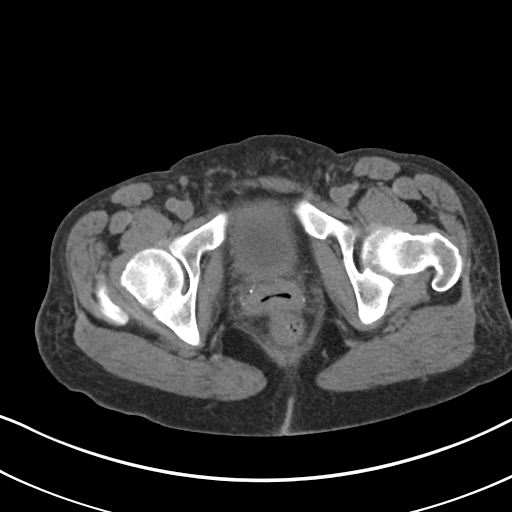
[im 27/87  soft-tissue]
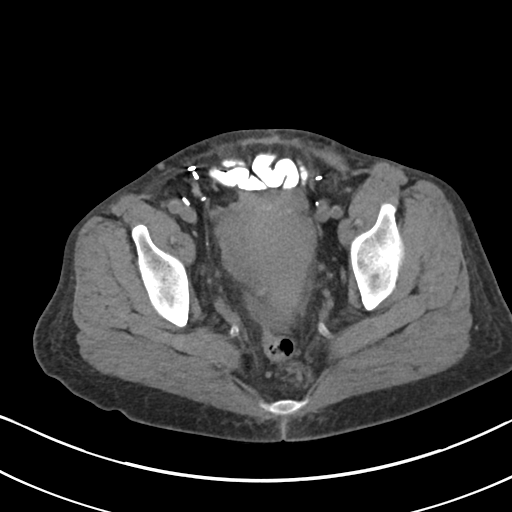
[im 34/87  soft-tissue]
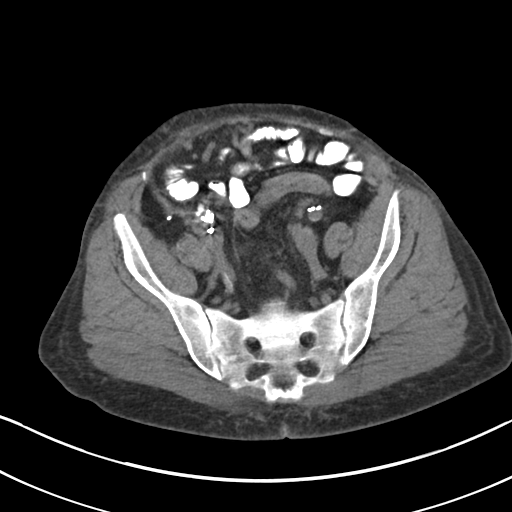
[im 42/87  soft-tissue]
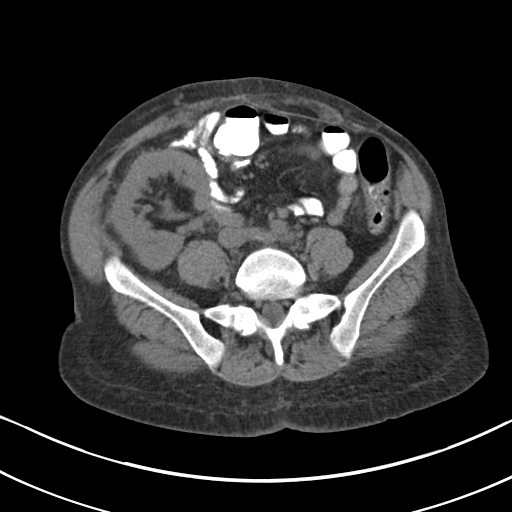
[im 45/87  soft-tissue]
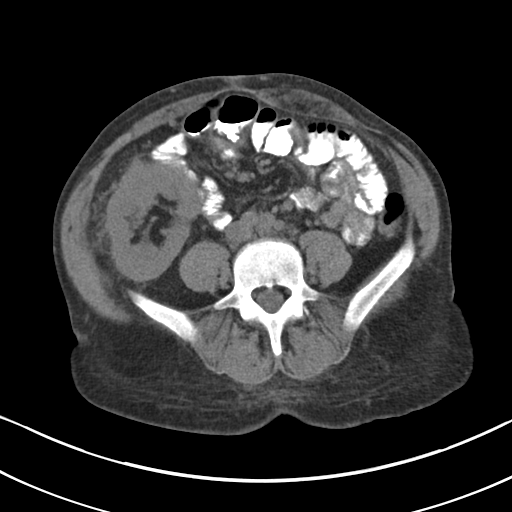
[im 53/87  soft-tissue]
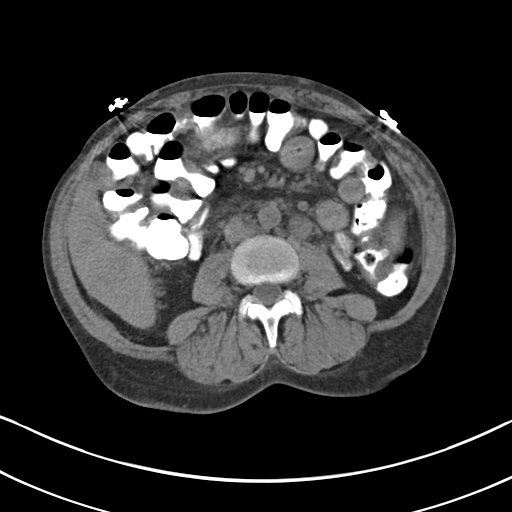
[im 60/87  soft-tissue]
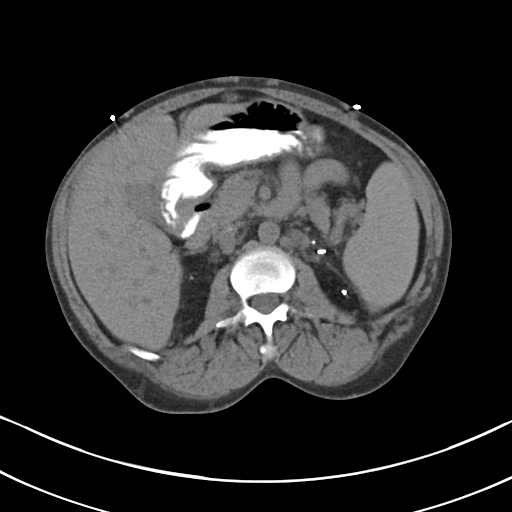
[im 60/87  bone]
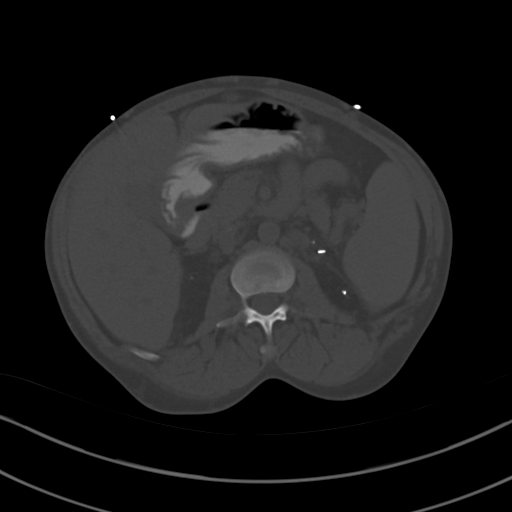
[im 68/87  soft-tissue]
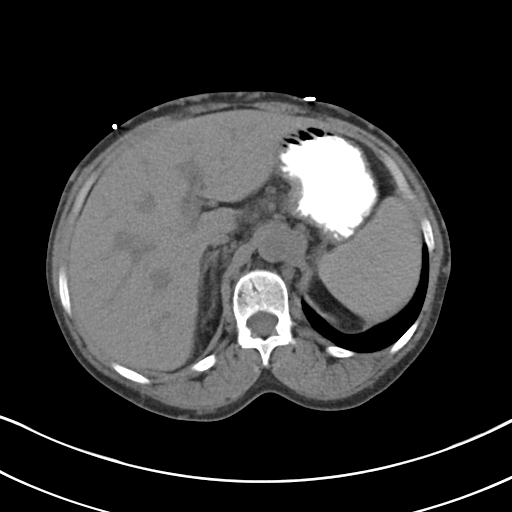
[im 75/87  soft-tissue]
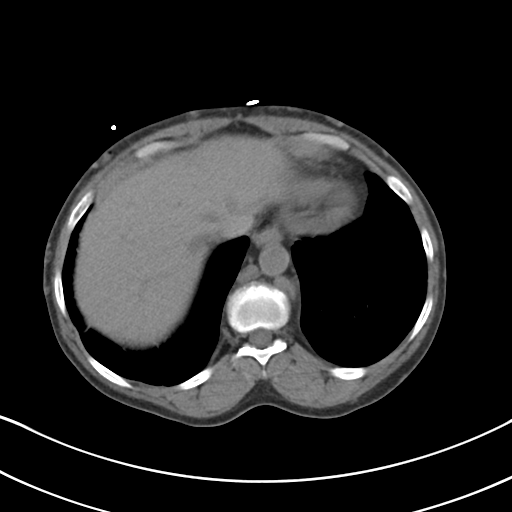
[im 83/87  soft-tissue]
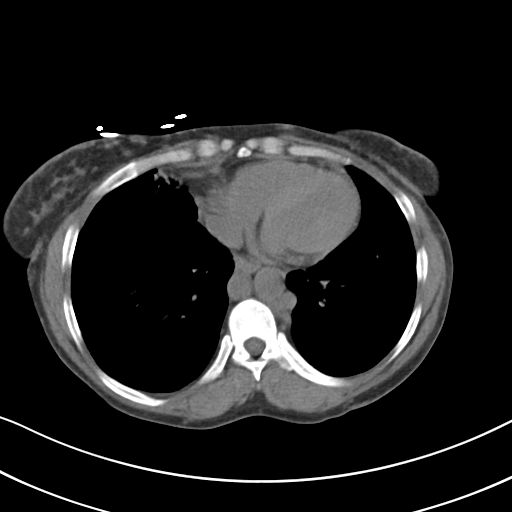

[Series 5: coronals · coronal · 0.65mm/px · 3 of 116 slices shown]
[im 39/116  soft-tissue]
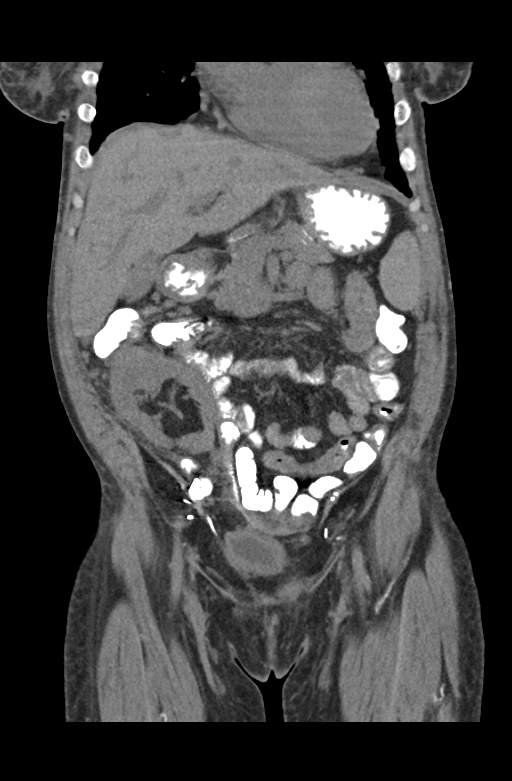
[im 52/116  soft-tissue]
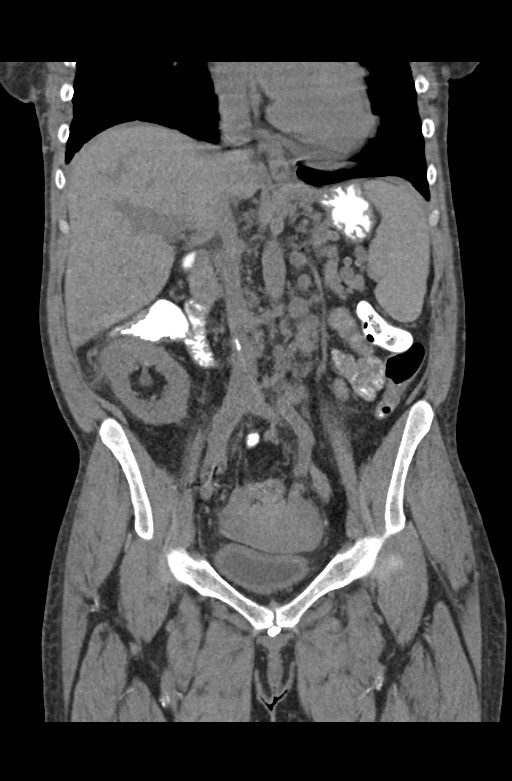
[im 64/116  soft-tissue]
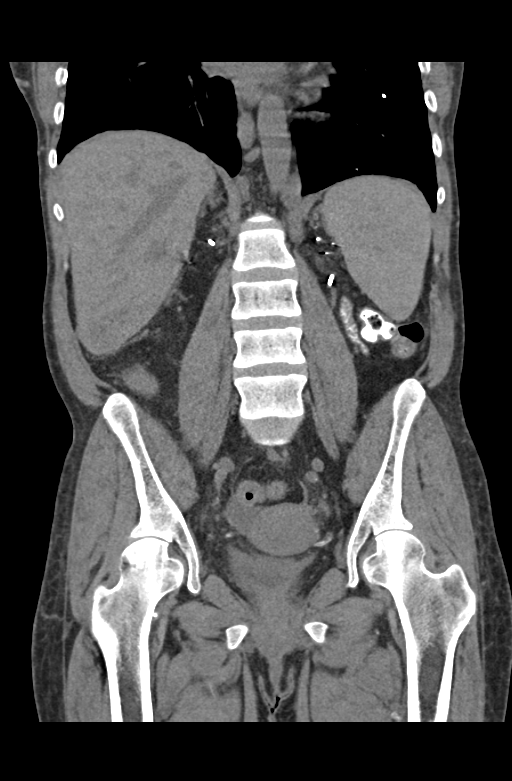

[15 of 46 positions shown; findings below may reference images not displayed]

FINDINGS: Stable lung bases with middle lobe and lower lobe scarring on the
right. Small left lower lobe calcified granulomas. No pericardial or
pleural effusion.

No acute osseous abnormality identified.  Right femoral head AVN.

Right lower quadrant renal transplant is stable in size and
configuration. No transplant hydronephrosis or hydroureter.
Diminutive bladder. Fairly extensive calcified atherosclerosis,
worst in the pelvis and proximal lower extremities than in the
abdomen. Chronic retroperitoneal venous collaterals which
communicate with the azygos and hemi azygous system appear to be in
response to diminutive IVC at the native renal level.

No pelvic free fluid. Pelvic vascular congestion/ mild stranding
appears stable. Stable noncontrast appearance of the uterus. At
adnexa might be surgically absent. Dense soft tissue calcification
along the course of the right external iliac artery is unchanged,
multiple surgical clips in the area. This might be a chronic
vascular finding related to the transplant surgical changes.

Decompressed rectum and sigmoid. Oral contrast in the left colon.
Contrast throughout the transverse and right colon. The cecum
appears to be located in the midline. Negative terminal ileum.
Appendix not identified. No pericecal inflammation. No dilated small
bowel. Negative stomach and duodenum.

Native kidneys are surgically absent as before. Noncontrast liver,
gallbladder, spleen, pancreas and adrenal glands are within normal
limits. No abdominal free fluid. No definite lymphadenopathy.
IMPRESSION: 1. Stable CT appearance of the abdomen and pelvis since 6120. Stable
CT appearance of the right lower quadrant renal transplant, without
transplant hydronephrosis. Native kidneys surgically absent as
before.
2. No acute or inflammatory process identified.

## 2016-09-04 ENCOUNTER — Ambulatory Visit (HOSPITAL_COMMUNITY)
Admission: RE | Admit: 2016-09-04 | Discharge: 2016-09-04 | Disposition: A | Discharge status: Home / Self Care | Payer: Medicaid Other | Source: Ambulatory Visit | Attending: Nephrology | Admitting: Nephrology

## 2016-09-04 DIAGNOSIS — Z940 Kidney transplant status: Secondary | ICD-10-CM | POA: Diagnosis not present

## 2016-09-04 DIAGNOSIS — D631 Anemia in chronic kidney disease: Secondary | ICD-10-CM

## 2016-09-04 DIAGNOSIS — N185 Chronic kidney disease, stage 5: Secondary | ICD-10-CM

## 2016-09-04 LAB — RENAL FUNCTION PANEL
Albumin: 4 g/dL (ref 3.5–5.0)
Anion gap: 9 (ref 5–15)
BUN: 41 mg/dL — ABNORMAL HIGH (ref 6–20)
CO2: 25 mmol/L (ref 22–32)
Calcium: 9.6 mg/dL (ref 8.9–10.3)
Chloride: 108 mmol/L (ref 101–111)
Creatinine, Ser: 2.76 mg/dL — ABNORMAL HIGH (ref 0.44–1.00)
GFR calc Af Amer: 22 mL/min — ABNORMAL LOW (ref >60)
GFR calc non Af Amer: 19 mL/min — ABNORMAL LOW (ref >60)
Glucose, Bld: 86 mg/dL (ref 65–99)
Phosphorus: 4.4 mg/dL (ref 2.5–4.6)
Potassium: 4.7 mmol/L (ref 3.5–5.1)
Sodium: 142 mmol/L (ref 135–145)

## 2016-09-04 LAB — IRON AND TIBC
Iron: 74 ug/dL (ref 28–170)
Saturation Ratios: 35 % — ABNORMAL HIGH (ref 10.4–31.8)
TIBC: 213 ug/dL — ABNORMAL LOW (ref 250–450)
UIBC: 139 ug/dL

## 2016-09-04 LAB — FERRITIN: Ferritin: 995 ng/mL — ABNORMAL HIGH (ref 11–307)

## 2016-09-04 LAB — POCT HEMOGLOBIN-HEMACUE: Hemoglobin: 8.5 g/dL — ABNORMAL LOW (ref 12.0–15.0)

## 2016-09-04 MED ORDER — EPOETIN ALFA 10000 UNIT/ML IJ SOLN
INTRAMUSCULAR | Status: AC
  Administered 2016-09-04: 10000 [IU] via SUBCUTANEOUS
  Filled 2016-09-04: qty 1

## 2016-09-04 MED ORDER — EPOETIN ALFA 20000 UNIT/ML IJ SOLN: 30000.0000 [IU] | INTRAMUSCULAR | Status: DC

## 2016-09-04 MED ORDER — EPOETIN ALFA 20000 UNIT/ML IJ SOLN
INTRAMUSCULAR | Status: AC
  Administered 2016-09-04: 20000 [IU] via SUBCUTANEOUS
  Filled 2016-09-04: qty 1

## 2016-09-04 MED ORDER — EPOETIN ALFA 2000 UNIT/ML IJ SOLN
INTRAMUSCULAR | Status: AC
  Filled 2016-09-04: qty 1

## 2016-09-09 ENCOUNTER — Other Ambulatory Visit: Payer: Self-pay | Admitting: Nephrology

## 2016-09-09 DIAGNOSIS — Z1231 Encounter for screening mammogram for malignant neoplasm of breast: Secondary | ICD-10-CM

## 2016-09-18 ENCOUNTER — Ambulatory Visit
Admission: RE | Admit: 2016-09-18 | Discharge: 2016-09-18 | Disposition: A | Discharge status: Home / Self Care | Payer: Medicaid Other | Source: Ambulatory Visit | Attending: Nephrology | Admitting: Nephrology

## 2016-09-18 DIAGNOSIS — Z1231 Encounter for screening mammogram for malignant neoplasm of breast: Secondary | ICD-10-CM

## 2016-10-08 ENCOUNTER — Other Ambulatory Visit (HOSPITAL_COMMUNITY): Payer: Self-pay | Admitting: *Deleted

## 2016-10-09 ENCOUNTER — Ambulatory Visit (HOSPITAL_COMMUNITY)
Admission: RE | Admit: 2016-10-09 | Discharge: 2016-10-09 | Disposition: A | Discharge status: Home / Self Care | Payer: Medicaid Other | Source: Ambulatory Visit | Attending: Nephrology | Admitting: Nephrology

## 2016-10-09 DIAGNOSIS — N185 Chronic kidney disease, stage 5: Secondary | ICD-10-CM

## 2016-10-09 DIAGNOSIS — Z940 Kidney transplant status: Secondary | ICD-10-CM | POA: Insufficient documentation

## 2016-10-09 DIAGNOSIS — D631 Anemia in chronic kidney disease: Secondary | ICD-10-CM

## 2016-10-09 LAB — RENAL FUNCTION PANEL
Albumin: 3.9 g/dL (ref 3.5–5.0)
Anion gap: 6 (ref 5–15)
BUN: 28 mg/dL — ABNORMAL HIGH (ref 6–20)
CO2: 23 mmol/L (ref 22–32)
Calcium: 9.5 mg/dL (ref 8.9–10.3)
Chloride: 111 mmol/L (ref 101–111)
Creatinine, Ser: 2.59 mg/dL — ABNORMAL HIGH (ref 0.44–1.00)
GFR calc Af Amer: 24 mL/min — ABNORMAL LOW (ref >60)
GFR calc non Af Amer: 21 mL/min — ABNORMAL LOW (ref >60)
Glucose, Bld: 85 mg/dL (ref 65–99)
Phosphorus: 3.6 mg/dL (ref 2.5–4.6)
Potassium: 4.5 mmol/L (ref 3.5–5.1)
Sodium: 140 mmol/L (ref 135–145)

## 2016-10-09 LAB — POCT HEMOGLOBIN-HEMACUE: Hemoglobin: 9.1 g/dL — ABNORMAL LOW (ref 12.0–15.0)

## 2016-10-09 MED ORDER — EPOETIN ALFA 40000 UNIT/ML IJ SOLN
INTRAMUSCULAR | Status: AC
  Administered 2016-10-09: 40000 [IU] via SUBCUTANEOUS
  Filled 2016-10-09: qty 1

## 2016-10-09 MED ORDER — EPOETIN ALFA 20000 UNIT/ML IJ SOLN: 40000.0000 [IU] | INTRAMUSCULAR | Status: DC

## 2016-10-11 LAB — TACROLIMUS LEVEL: Tacrolimus (FK506) - LabCorp: 6.8 ng/mL (ref 2.0–20.0)

## 2016-10-13 ENCOUNTER — Other Ambulatory Visit: Payer: Self-pay | Admitting: Gastroenterology

## 2016-10-19 ENCOUNTER — Other Ambulatory Visit: Payer: Self-pay | Admitting: Gastroenterology

## 2016-10-20 ENCOUNTER — Other Ambulatory Visit: Payer: Self-pay | Admitting: Gastroenterology

## 2016-10-20 MED ORDER — METOCLOPRAMIDE HCL 5 MG PO TABS: ORAL_TABLET | ORAL | 0 refills | Status: DC

## 2016-10-20 NOTE — Telephone Encounter (Signed)
1 refill sent pt must keep scheduled appt for further refills.

## 2016-11-06 ENCOUNTER — Encounter (HOSPITAL_COMMUNITY)
Admission: RE | Admit: 2016-11-06 | Discharge: 2016-11-06 | Disposition: A | Discharge status: Home / Self Care | Payer: Medicaid Other | Source: Ambulatory Visit | Attending: Nephrology | Admitting: Nephrology

## 2016-11-06 ENCOUNTER — Encounter: Payer: Self-pay | Admitting: Gastroenterology

## 2016-11-06 ENCOUNTER — Ambulatory Visit (INDEPENDENT_AMBULATORY_CARE_PROVIDER_SITE_OTHER): Payer: Medicaid Other | Admitting: Gastroenterology

## 2016-11-06 VITALS — BP 84/50 | HR 64 | Ht 59.0 in | Wt 116.1 lb

## 2016-11-06 DIAGNOSIS — R112 Nausea with vomiting, unspecified: Secondary | ICD-10-CM

## 2016-11-06 DIAGNOSIS — N185 Chronic kidney disease, stage 5: Secondary | ICD-10-CM | POA: Diagnosis not present

## 2016-11-06 DIAGNOSIS — D631 Anemia in chronic kidney disease: Secondary | ICD-10-CM

## 2016-11-06 DIAGNOSIS — Z940 Kidney transplant status: Secondary | ICD-10-CM | POA: Diagnosis not present

## 2016-11-06 LAB — RENAL FUNCTION PANEL
Albumin: 4 g/dL (ref 3.5–5.0)
Anion gap: 6 (ref 5–15)
BUN: 39 mg/dL — ABNORMAL HIGH (ref 6–20)
CO2: 24 mmol/L (ref 22–32)
Calcium: 9.3 mg/dL (ref 8.9–10.3)
Chloride: 109 mmol/L (ref 101–111)
Creatinine, Ser: 2.46 mg/dL — ABNORMAL HIGH (ref 0.44–1.00)
GFR calc Af Amer: 25 mL/min — ABNORMAL LOW (ref >60)
GFR calc non Af Amer: 22 mL/min — ABNORMAL LOW (ref >60)
Glucose, Bld: 90 mg/dL (ref 65–99)
Phosphorus: 4.1 mg/dL (ref 2.5–4.6)
Potassium: 4.6 mmol/L (ref 3.5–5.1)
Sodium: 139 mmol/L (ref 135–145)

## 2016-11-06 LAB — IRON AND TIBC
Iron: 70 ug/dL (ref 28–170)
Saturation Ratios: 33 % — ABNORMAL HIGH (ref 10.4–31.8)
TIBC: 210 ug/dL — ABNORMAL LOW (ref 250–450)
UIBC: 140 ug/dL

## 2016-11-06 LAB — FERRITIN: Ferritin: 790 ng/mL — ABNORMAL HIGH (ref 11–307)

## 2016-11-06 MED ORDER — METOCLOPRAMIDE HCL 5 MG PO TABS: 5.0000 mg | ORAL_TABLET | Freq: Two times a day (BID) | ORAL | 11 refills | Status: DC

## 2016-11-06 MED ORDER — EPOETIN ALFA 20000 UNIT/ML IJ SOLN
40000.0000 [IU] | INTRAMUSCULAR | Status: DC
  Administered 2016-11-06: 40000 [IU] via SUBCUTANEOUS

## 2016-11-06 MED ORDER — EPOETIN ALFA 40000 UNIT/ML IJ SOLN
INTRAMUSCULAR | Status: AC
  Filled 2016-11-06: qty 1

## 2016-11-06 NOTE — Patient Instructions (Addendum)
Limiting narcotic pain medicines will help your stomach function best.  Reglan 5mg  refills today (5mg  pill, one pill in AM and one pill at bedtime, 60 pill with 11 refills).  ROV in needed.  She will call back with details about her suppository medicine (probably phenergan) and we will call in refills of this as well.  Normal BMI (Body Mass Index- based on height and weight) is between 19 and 25. Your BMI today is Body mass index is 23.45 kg/m. [name] Please consider follow up  regarding your BMI with your Primary Care Provider.

## 2016-11-06 NOTE — Progress Notes (Signed)
Review of pertinent gastrointestinal problems:  1. Chronic nausea, vomiting. EGD August 2009 was normal except for small hiatal hernia. Status post kidney transplant on immunosuppressive medicines which could cause nausea, narcotics periodically. Normal complete metabolic profile, slight anemia, labs July 2009. Changing her Myfortic to Imuran helped nausea temporarily, symptoms returned. GES 03/2008 slightly slow emptying. [name] 2/201normal GB, normal bilary tree. Hospitalized 03/2008 [facility] MD put her on reglan 5mg  three times a day, GI was not consulted. March, 2010 combination of daily scheduled Zofran and t.i.d. Reglan 5 mg a day has been helpful. She was cut back to Reglan as needed to due concern for potential side effects. June 2011: Was recommended to take 1-2 Reglan pills at bedtime only. Also started Megace for appetite stimulant. January 2013: She has gained 20 pounds and her nausea is under much better control with nightly Reglan. 04/2012 EGD Ardis Hughs; normal except for possible candida in esophagus, treated.  09/2013 intermittent exacerbations of her nausea, multifactorial.  Chronic symptoms under good control with reglan, zofran.   HPI: This is a  very pleasant 51 year old woman whom I last saw 2 and half years ago  No need for hospitlazation for vomiting in past 1-2 years. Overall feeling better.  If she feels she is having issues, she will take an extra Reglan or a antinausea suppository  Takes reglan 5mg  twice daily (bedtime and QAM).  Chief complaint is  gastroparesis, chronic nausea Weight is up 5 pounds in 2 years (same scale here in gi office)  ROS: complete GI ROS as described in HPI, all other review negative.  Constitutional:  No unintentional weight loss   Past Medical History:  Diagnosis Date  . Bacteremia due to Gram-negative bacteria 05/23/2011  . Blind right eye   . Chronic lower back pain   . Complication of anesthesia    "woke up during OR; I have an extremely  high tolerance" (12/11/2011) 1 procedure was graft; the other procedures were procedures that are typically done with sedation.  . DDD (degenerative disc disease), cervical   . Depression   . Dysrhythmia    "tachycardia" (12/11/2011) new onset afib 10/15/14 EKG  . E coli bacteremia 06/18/2011  . ESRD (end stage renal disease) ([place]) 06/12/2011  . Fibromyalgia   . Gastroparesis   . Gastroparesis   . GERD (gastroesophageal reflux disease)   . Glaucoma    right eye  . Gout   . H/O carpal tunnel syndrome   . Headache(784.0)    "not often anymore" (12/11/2011)  . Herpes genitalia 1994  . History of blood transfusion    "more than a few times" (12/11/2011)  . History of stomach ulcers   . Hypotension   . Iron deficiency anemia   . New onset a-fib ([place])    10/15/14 EKG  . Osteopenia   . Seizures ([place]) 1994   "post transplant; only have had that one" (12/11/2011)  . Spinal stenosis in cervical region   . Stroke [facility])     left basal ganglia lacunar infarct; Right frontal lobe lacunar infarct.  . Stroke [facility]) ~ 1999; 2001   "briefly lost my vision; lost my right eye" (12/11/2011)    Past Surgical History:  Procedure Laterality Date  . ANTERIOR CERVICAL DECOMP/DISCECTOMY FUSION N/A 01/08/2015   Procedure: Anterior Cervical Three-Four/Four-Five Decompression/Diskectomy/Fusion;  Surgeon: Leeroy Cha, MD;  Location: [place] [facility]URO ORS;  Service: Neurosurgery;  Laterality: N/A;  C3-4 C4-5 Anterior cervical decompression/diskectomy/fusion  . APPENDECTOMY  ~ 2004  . CATARACT EXTRACTION  right eye  . COLONOSCOPY    . ENUCLEATION  2001   "right"  . ESOPHAGOGASTRODUODENOSCOPY (EGD) WITH PROPOFOL N/A 04/21/2012   Procedure: ESOPHAGOGASTRODUODENOSCOPY (EGD) WITH PROPOFOL;  Surgeon: Milus Banister, MD;  Location[facility];  Service: Endoscopy;  Laterality: N/A;  . 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 [place]; 1999;  2005   "right"  . MULTIPLE TOOTH EXTRACTIONS    . TONSILLECTOMY    . TOTAL NEPHRECTOMY  1988?; 1994; 2005    Current Outpatient Prescriptions  Medication Sig Dispense Refill  . ALPRAZolam (XANAX) 0.25 MG tablet Take 0.25 mg by mouth daily as needed (FOR SVT).    [name] aspirin EC 81 MG tablet Take 81 mg by mouth every morning.     [name] azaTHIOprine (IMURAN) 50 MG tablet Take 100 mg by mouth daily.     . colchicine 0.6 MG tablet Take 0.6 mg by mouth daily as needed (For gout.).    [name] metoCLOPramide (REGLAN) 5 MG tablet TAKE 1 TABLET BY MOUTH AT BEDTIME AND MAY REPEAT DOSE ONE TIME IF NEEDED 60 tablet 0  . omeprazole (PRILOSEC) 40 MG capsule Take 40 mg by mouth 2 (two) times daily.     [name] oxyCODONE-acetaminophen (PERCOCET) 10-325 MG tablet Take 1 tablet by mouth every 6 (six) hours as needed for pain.    . predniSONE (DELTASONE) 5 MG tablet Take 5 mg by mouth daily.    . promethazine (PHENERGAN) 25 MG tablet Take 1 tablet (25 mg total) by mouth every 6 (six) hours as needed for nausea or vomiting. 12 tablet 0  . tacrolimus (PROGRAF) 1 MG capsule Take 3 mg by mouth 2 (two) times daily.     [name] zolpidem (AMBIEN) 10 MG tablet Take 10 mg by mouth at bedtime as needed for sleep.     No current facility-administered medications for this visit.    Facility-Administered Medications Ordered in Other Visits  Medication Dose Route Frequency Provider Last Rate Last Dose  . epoetin alfa (EPOGEN,PROCRIT) 41740 UNIT/ML injection           . epoetin alfa (EPOGEN,PROCRIT) injection 40,000 Units  40,000 Units Subcutaneous Q14 Days Donato Heinz, MD   40,000 Units at 11/06/16 1006    Allergies as of 11/06/2016 - Review Complete 11/06/2016  Allergen Reaction Noted  . Levofloxacin Itching and Rash 05/23/2011    Family History  Problem Relation Age of Onset  . Glaucoma Mother   . Multiple sclerosis Brother   . Hypertension Maternal Grandmother   . Breast cancer Neg Hx     Social History   Social History   . Marital status: Single    Spouse name: N/A  . Number of children: 0  . Years of education: N/A   Occupational History  . disabiled Unemployed   Social History Main Topics  . Smoking status: Current Every Day Smoker    Packs/day: 0.20    Years: 28.00    Types: Cigarettes  . Smokeless tobacco: Never Used     Comment: had quit 03/05/2011  . Alcohol use Yes     Comment: rare  . Drug use: Yes    Types: Marijuana  . Sexual activity: Yes   Other Topics Concern  . Not on file   Social History Narrative  . No narrative on file     Physical Exam: BP (!) 84/50   Pulse 64  Ht 4\' 11"  (1.499 m)   Wt 116 lb 2 oz (52.7 kg)   BMI 23.45 kg/m  Constitutional: Chronically ill-appearing Psychiatric: alert and oriented x3 Abdomen: soft, nontender, nondistended, no obvious ascites, no peritoneal signs, normal bowel sounds No peripheral edema noted in lower extremities  Assessment and plan: 51 y.o. female with  gastroparesis, chronic nausea multifactorial she has been generally doing very well on scheduled twice daily 5 mg Reglan. She will rescue with an extra Reglan or a antinausea suppository periodically. She has not required admission to hospital in a year to because of her gastric issues. She is really here for refills and she is doing well and happy to refill her Reglan at her current dose. She will call to report on what her antinausea suppository is, probably it is Phenergan which I want to make sure I'm happy to refill that after she calls in.  Please see the "Patient Instructions" section for addition details about the plan.  Owens Loffler, MD [facility] 11/06/2016, 10:51 AM

## 2016-11-08 LAB — TACROLIMUS LEVEL: Tacrolimus (FK506) - LabCorp: 6.6 ng/mL (ref 2.0–20.0)

## 2016-11-09 MED FILL — Epoetin Alfa Inj 40000 Unit/ML: INTRAMUSCULAR | Qty: 1 | Status: AC

## 2016-11-10 LAB — POCT HEMOGLOBIN-HEMACUE: Hemoglobin: 9.2 g/dL — ABNORMAL LOW (ref 12.0–15.0)

## 2016-11-24 ENCOUNTER — Other Ambulatory Visit: Payer: Self-pay | Admitting: Neurosurgery

## 2016-11-24 DIAGNOSIS — M542 Cervicalgia: Secondary | ICD-10-CM

## 2016-11-26 ENCOUNTER — Ambulatory Visit
Admission: RE | Admit: 2016-11-26 | Discharge: 2016-11-26 | Disposition: A | Discharge status: Home / Self Care | Payer: Medicaid Other | Source: Ambulatory Visit | Attending: Neurosurgery | Admitting: Neurosurgery

## 2016-11-26 DIAGNOSIS — M542 Cervicalgia: Secondary | ICD-10-CM

## 2016-12-03 ENCOUNTER — Other Ambulatory Visit (HOSPITAL_COMMUNITY): Payer: Self-pay | Admitting: *Deleted

## 2016-12-04 ENCOUNTER — Encounter (HOSPITAL_COMMUNITY)
Admission: RE | Admit: 2016-12-04 | Discharge: 2016-12-04 | Disposition: A | Discharge status: Home / Self Care | Payer: Medicaid Other | Source: Ambulatory Visit | Attending: Nephrology | Admitting: Nephrology

## 2016-12-04 DIAGNOSIS — N185 Chronic kidney disease, stage 5: Secondary | ICD-10-CM | POA: Insufficient documentation

## 2016-12-04 DIAGNOSIS — Z940 Kidney transplant status: Secondary | ICD-10-CM | POA: Insufficient documentation

## 2016-12-04 DIAGNOSIS — D631 Anemia in chronic kidney disease: Secondary | ICD-10-CM | POA: Diagnosis present

## 2016-12-04 LAB — RENAL FUNCTION PANEL
Albumin: 3.9 g/dL (ref 3.5–5.0)
Anion gap: 6 (ref 5–15)
BUN: 27 mg/dL — ABNORMAL HIGH (ref 6–20)
CO2: 24 mmol/L (ref 22–32)
Calcium: 9.2 mg/dL (ref 8.9–10.3)
Chloride: 108 mmol/L (ref 101–111)
Creatinine, Ser: 2.21 mg/dL — ABNORMAL HIGH (ref 0.44–1.00)
GFR calc Af Amer: 29 mL/min — ABNORMAL LOW (ref >60)
GFR calc non Af Amer: 25 mL/min — ABNORMAL LOW (ref >60)
Glucose, Bld: 80 mg/dL (ref 65–99)
Phosphorus: 3.8 mg/dL (ref 2.5–4.6)
Potassium: 4.6 mmol/L (ref 3.5–5.1)
Sodium: 138 mmol/L (ref 135–145)

## 2016-12-04 LAB — POCT HEMOGLOBIN-HEMACUE: Hemoglobin: 9.1 g/dL — ABNORMAL LOW (ref 12.0–15.0)

## 2016-12-04 MED ORDER — EPOETIN ALFA 40000 UNIT/ML IJ SOLN
INTRAMUSCULAR | Status: AC
  Administered 2016-12-04: 40000 [IU]
  Filled 2016-12-04: qty 1

## 2016-12-04 MED ORDER — EPOETIN ALFA 20000 UNIT/ML IJ SOLN: 40000.0000 [IU] | INTRAMUSCULAR | Status: DC

## 2016-12-05 LAB — TACROLIMUS LEVEL: Tacrolimus (FK506) - LabCorp: 6.6 ng/mL (ref 2.0–20.0)

## 2016-12-11 ENCOUNTER — Other Ambulatory Visit: Payer: Self-pay | Admitting: Gastroenterology

## 2017-01-08 ENCOUNTER — Ambulatory Visit (HOSPITAL_COMMUNITY)
Admission: RE | Admit: 2017-01-08 | Discharge: 2017-01-08 | Disposition: A | Discharge status: Home / Self Care | Payer: Medicaid Other | Source: Ambulatory Visit | Attending: Nephrology | Admitting: Nephrology

## 2017-01-08 VITALS — HR 70 | Temp 98.5°F | Resp 18

## 2017-01-08 DIAGNOSIS — Z5181 Encounter for therapeutic drug level monitoring: Secondary | ICD-10-CM | POA: Diagnosis not present

## 2017-01-08 DIAGNOSIS — Z79899 Other long term (current) drug therapy: Secondary | ICD-10-CM | POA: Diagnosis not present

## 2017-01-08 DIAGNOSIS — Z940 Kidney transplant status: Secondary | ICD-10-CM | POA: Insufficient documentation

## 2017-01-08 DIAGNOSIS — N185 Chronic kidney disease, stage 5: Secondary | ICD-10-CM | POA: Insufficient documentation

## 2017-01-08 DIAGNOSIS — D631 Anemia in chronic kidney disease: Secondary | ICD-10-CM | POA: Diagnosis not present

## 2017-01-08 LAB — RENAL FUNCTION PANEL
Albumin: 4 g/dL (ref 3.5–5.0)
Anion gap: 10 (ref 5–15)
BUN: 34 mg/dL — ABNORMAL HIGH (ref 6–20)
CO2: 22 mmol/L (ref 22–32)
Calcium: 9.2 mg/dL (ref 8.9–10.3)
Chloride: 106 mmol/L (ref 101–111)
Creatinine, Ser: 2.47 mg/dL — ABNORMAL HIGH (ref 0.44–1.00)
GFR calc Af Amer: 25 mL/min — ABNORMAL LOW (ref >60)
GFR calc non Af Amer: 22 mL/min — ABNORMAL LOW (ref >60)
Glucose, Bld: 91 mg/dL (ref 65–99)
Phosphorus: 4.6 mg/dL (ref 2.5–4.6)
Potassium: 4.3 mmol/L (ref 3.5–5.1)
Sodium: 138 mmol/L (ref 135–145)

## 2017-01-08 LAB — IRON AND TIBC
Iron: 118 ug/dL (ref 28–170)
Saturation Ratios: 53 % — ABNORMAL HIGH (ref 10.4–31.8)
TIBC: 223 ug/dL — ABNORMAL LOW (ref 250–450)
UIBC: 105 ug/dL

## 2017-01-08 LAB — FERRITIN: Ferritin: 830 ng/mL — ABNORMAL HIGH (ref 11–307)

## 2017-01-08 MED ORDER — EPOETIN ALFA 20000 UNIT/ML IJ SOLN: 40000.0000 [IU] | INTRAMUSCULAR | Status: DC

## 2017-01-08 MED ORDER — EPOETIN ALFA 40000 UNIT/ML IJ SOLN
INTRAMUSCULAR | Status: AC
Start: 2017-01-08 — End: 2017-01-08
  Administered 2017-01-08: 40000 [IU]
  Filled 2017-01-08: qty 1

## 2017-01-09 LAB — PTH, INTACT AND CALCIUM
Calcium, Total (PTH): 9.4 mg/dL (ref 8.7–10.2)
PTH: 277 pg/mL — ABNORMAL HIGH (ref 15–65)

## 2017-01-12 LAB — POCT HEMOGLOBIN-HEMACUE: Hemoglobin: 9.7 g/dL — ABNORMAL LOW (ref 12.0–15.0)

## 2017-01-14 ENCOUNTER — Other Ambulatory Visit: Payer: Self-pay | Admitting: Gastroenterology

## 2017-01-20 DIAGNOSIS — Z79899 Other long term (current) drug therapy: Secondary | ICD-10-CM | POA: Diagnosis not present

## 2017-01-20 DIAGNOSIS — E782 Mixed hyperlipidemia: Secondary | ICD-10-CM | POA: Diagnosis not present

## 2017-01-20 DIAGNOSIS — Z0000 Encounter for general adult medical examination without abnormal findings: Secondary | ICD-10-CM | POA: Diagnosis not present

## 2017-01-20 DIAGNOSIS — E559 Vitamin D deficiency, unspecified: Secondary | ICD-10-CM | POA: Diagnosis not present

## 2017-01-22 ENCOUNTER — Ambulatory Visit (HOSPITAL_COMMUNITY)
Admission: RE | Admit: 2017-01-22 | Discharge: 2017-01-22 | Disposition: A | Discharge status: Home / Self Care | Payer: Medicaid Other | Source: Ambulatory Visit | Attending: Nephrology | Admitting: Nephrology

## 2017-01-22 VITALS — BP 89/57 | HR 63 | Resp 18

## 2017-01-22 DIAGNOSIS — D631 Anemia in chronic kidney disease: Secondary | ICD-10-CM | POA: Insufficient documentation

## 2017-01-22 DIAGNOSIS — Z940 Kidney transplant status: Secondary | ICD-10-CM | POA: Diagnosis present

## 2017-01-22 DIAGNOSIS — N185 Chronic kidney disease, stage 5: Secondary | ICD-10-CM | POA: Insufficient documentation

## 2017-01-22 LAB — POCT HEMOGLOBIN-HEMACUE: Hemoglobin: 9.2 g/dL — ABNORMAL LOW (ref 12.0–15.0)

## 2017-01-22 MED ORDER — EPOETIN ALFA 40000 UNIT/ML IJ SOLN
INTRAMUSCULAR | Status: AC
  Administered 2017-01-22: 40000 [IU] via SUBCUTANEOUS
  Filled 2017-01-22: qty 1

## 2017-01-22 MED ORDER — EPOETIN ALFA 20000 UNIT/ML IJ SOLN: 40000.0000 [IU] | INTRAMUSCULAR | Status: DC

## 2017-02-05 ENCOUNTER — Encounter (HOSPITAL_COMMUNITY)
Admission: RE | Admit: 2017-02-05 | Discharge: 2017-02-05 | Disposition: A | Discharge status: Home / Self Care | Payer: Medicaid Other | Source: Ambulatory Visit | Attending: Nephrology | Admitting: Nephrology

## 2017-02-05 VITALS — BP 99/54 | HR 67 | Temp 98.2°F | Resp 18

## 2017-02-05 DIAGNOSIS — Z940 Kidney transplant status: Secondary | ICD-10-CM

## 2017-02-05 DIAGNOSIS — N185 Chronic kidney disease, stage 5: Secondary | ICD-10-CM

## 2017-02-05 DIAGNOSIS — D631 Anemia in chronic kidney disease: Secondary | ICD-10-CM

## 2017-02-05 LAB — POCT HEMOGLOBIN-HEMACUE: Hemoglobin: 9.3 g/dL — ABNORMAL LOW (ref 12.0–15.0)

## 2017-02-05 LAB — RENAL FUNCTION PANEL
Albumin: 3.6 g/dL (ref 3.5–5.0)
Anion gap: 8 (ref 5–15)
BUN: 31 mg/dL — ABNORMAL HIGH (ref 6–20)
CO2: 21 mmol/L — ABNORMAL LOW (ref 22–32)
Calcium: 9.2 mg/dL (ref 8.9–10.3)
Chloride: 108 mmol/L (ref 101–111)
Creatinine, Ser: 2.15 mg/dL — ABNORMAL HIGH (ref 0.44–1.00)
GFR calc Af Amer: 30 mL/min — ABNORMAL LOW (ref >60)
GFR calc non Af Amer: 26 mL/min — ABNORMAL LOW (ref >60)
Glucose, Bld: 87 mg/dL (ref 65–99)
Phosphorus: 4.3 mg/dL (ref 2.5–4.6)
Potassium: 4.6 mmol/L (ref 3.5–5.1)
Sodium: 137 mmol/L (ref 135–145)

## 2017-02-05 MED ORDER — EPOETIN ALFA 40000 UNIT/ML IJ SOLN
INTRAMUSCULAR | Status: AC
  Administered 2017-02-05: 40000 [IU]
  Filled 2017-02-05: qty 1

## 2017-02-05 MED ORDER — EPOETIN ALFA 20000 UNIT/ML IJ SOLN: 40000.0000 [IU] | INTRAMUSCULAR | Status: DC

## 2017-02-06 LAB — PTH, INTACT AND CALCIUM
Calcium, Total (PTH): 9.3 mg/dL (ref 8.7–10.2)
PTH: 283 pg/mL — ABNORMAL HIGH (ref 15–65)

## 2017-02-09 ENCOUNTER — Other Ambulatory Visit: Payer: Self-pay | Admitting: Gastroenterology

## 2017-02-19 ENCOUNTER — Encounter (HOSPITAL_COMMUNITY)
Admission: RE | Admit: 2017-02-19 | Discharge: 2017-02-19 | Disposition: A | Discharge status: Home / Self Care | Payer: Medicaid Other | Source: Ambulatory Visit | Attending: Nephrology | Admitting: Nephrology

## 2017-02-19 VITALS — BP 110/55 | HR 60 | Temp 98.1°F | Resp 16

## 2017-02-19 DIAGNOSIS — D631 Anemia in chronic kidney disease: Secondary | ICD-10-CM

## 2017-02-19 DIAGNOSIS — Z940 Kidney transplant status: Secondary | ICD-10-CM | POA: Diagnosis not present

## 2017-02-19 DIAGNOSIS — N185 Chronic kidney disease, stage 5: Secondary | ICD-10-CM

## 2017-02-19 LAB — POCT HEMOGLOBIN-HEMACUE: Hemoglobin: 9.6 g/dL — ABNORMAL LOW (ref 12.0–15.0)

## 2017-02-19 MED ORDER — EPOETIN ALFA 20000 UNIT/ML IJ SOLN: 40000.0000 [IU] | INTRAMUSCULAR | Status: DC

## 2017-02-19 MED ORDER — EPOETIN ALFA 40000 UNIT/ML IJ SOLN
INTRAMUSCULAR | Status: AC
  Administered 2017-02-19: 40000 [IU] via SUBCUTANEOUS
  Filled 2017-02-19: qty 1

## 2017-03-05 ENCOUNTER — Encounter (HOSPITAL_COMMUNITY)
Admission: RE | Admit: 2017-03-05 | Discharge: 2017-03-05 | Disposition: A | Discharge status: Home / Self Care | Payer: Medicaid Other | Source: Ambulatory Visit | Attending: Nephrology | Admitting: Nephrology

## 2017-03-05 VITALS — BP 96/52 | HR 100 | Temp 98.1°F | Resp 18

## 2017-03-05 DIAGNOSIS — D631 Anemia in chronic kidney disease: Secondary | ICD-10-CM | POA: Diagnosis not present

## 2017-03-05 DIAGNOSIS — Z940 Kidney transplant status: Secondary | ICD-10-CM | POA: Diagnosis not present

## 2017-03-05 DIAGNOSIS — N185 Chronic kidney disease, stage 5: Secondary | ICD-10-CM | POA: Insufficient documentation

## 2017-03-05 LAB — RENAL FUNCTION PANEL
Albumin: 4 g/dL (ref 3.5–5.0)
Anion gap: 11 (ref 5–15)
BUN: 25 mg/dL — ABNORMAL HIGH (ref 6–20)
CO2: 21 mmol/L — ABNORMAL LOW (ref 22–32)
Calcium: 9.3 mg/dL (ref 8.9–10.3)
Chloride: 107 mmol/L (ref 101–111)
Creatinine, Ser: 2.07 mg/dL — ABNORMAL HIGH (ref 0.44–1.00)
GFR calc Af Amer: 31 mL/min — ABNORMAL LOW (ref >60)
GFR calc non Af Amer: 27 mL/min — ABNORMAL LOW (ref >60)
Glucose, Bld: 85 mg/dL (ref 65–99)
Phosphorus: 3.9 mg/dL (ref 2.5–4.6)
Potassium: 4.5 mmol/L (ref 3.5–5.1)
Sodium: 139 mmol/L (ref 135–145)

## 2017-03-05 LAB — IRON AND TIBC
Iron: 83 ug/dL (ref 28–170)
Saturation Ratios: 41 % — ABNORMAL HIGH (ref 10.4–31.8)
TIBC: 203 ug/dL — ABNORMAL LOW (ref 250–450)
UIBC: 120 ug/dL

## 2017-03-05 LAB — FERRITIN: Ferritin: 792 ng/mL — ABNORMAL HIGH (ref 11–307)

## 2017-03-05 MED ORDER — EPOETIN ALFA 20000 UNIT/ML IJ SOLN: 40000.0000 [IU] | INTRAMUSCULAR | Status: DC

## 2017-03-05 MED ORDER — EPOETIN ALFA 40000 UNIT/ML IJ SOLN
INTRAMUSCULAR | Status: AC
  Administered 2017-03-05: 40000 [IU] via SUBCUTANEOUS
  Filled 2017-03-05: qty 1

## 2017-03-06 LAB — PTH, INTACT AND CALCIUM
Calcium, Total (PTH): 9.4 mg/dL (ref 8.7–10.2)
PTH: 292 pg/mL — ABNORMAL HIGH (ref 15–65)

## 2017-03-08 ENCOUNTER — Other Ambulatory Visit: Payer: Self-pay | Admitting: Gastroenterology

## 2017-03-08 LAB — POCT HEMOGLOBIN-HEMACUE: Hemoglobin: 10.1 g/dL — ABNORMAL LOW (ref 12.0–15.0)

## 2017-03-15 ENCOUNTER — Ambulatory Visit: Payer: Medicaid Other | Attending: Physician Assistant | Admitting: Physical Therapy

## 2017-03-15 ENCOUNTER — Encounter: Payer: Self-pay | Admitting: Physical Therapy

## 2017-03-15 ENCOUNTER — Other Ambulatory Visit: Payer: Self-pay

## 2017-03-15 DIAGNOSIS — M542 Cervicalgia: Secondary | ICD-10-CM | POA: Diagnosis present

## 2017-03-15 DIAGNOSIS — R293 Abnormal posture: Secondary | ICD-10-CM

## 2017-03-15 DIAGNOSIS — M62838 Other muscle spasm: Secondary | ICD-10-CM | POA: Diagnosis present

## 2017-03-15 NOTE — Therapy (Signed)
[place], [place], [address] Phone: [phone]   Fax:  [phone]  Physical Therapy Evaluation  Patient Details  Name: Tina Watkins MRN: [id number] Date of Birth: Jan 08, 1966 Referring Provider: Ferne Reus PA-C   Encounter Date: 03/15/2017  PT End of Session - 03/15/17 1252    Visit Number  1    Number of Visits  13    Date for PT Re-Evaluation  05/10/17    Authorization Type  MCD    PT Start Time  1153 pt arrived 8 min late    PT Stop Time  1238    PT Time Calculation (min)  45 min    Activity Tolerance  Patient tolerated treatment well;Patient limited by pain    Behavior During Therapy  [facility] for tasks assessed/performed       Past Medical History:  Diagnosis Date  . Bacteremia due to Gram-negative bacteria 05/23/2011  . Blind right eye   . Chronic lower back pain   . Complication of anesthesia    "woke up during OR; I have an extremely high tolerance" (12/11/2011) 1 procedure was graft; the other procedures were procedures that are typically done with sedation.  . DDD (degenerative disc disease), cervical   . Depression   . Dysrhythmia    "tachycardia" (12/11/2011) new onset afib 10/15/14 EKG  . E coli bacteremia 06/18/2011  . ESRD (end stage renal disease) ([place]) 06/12/2011  . Fibromyalgia   . Gastroparesis   . Gastroparesis   . GERD (gastroesophageal reflux disease)   . Glaucoma    right eye  . Gout   . H/O carpal tunnel syndrome   . Headache(784.0)    "not often anymore" (12/11/2011)  . Herpes genitalia 1994  . History of blood transfusion    "more than a few times" (12/11/2011)  . History of stomach ulcers   . Hypotension   . Iron deficiency anemia   . New onset a-fib ([place])    10/15/14 EKG  . Osteopenia   . Seizures ([place]) 1994   "post transplant; only have had that one" (12/11/2011)  . Spinal stenosis in cervical region   . Stroke [facility])     left basal ganglia lacunar  infarct; Right frontal lobe lacunar infarct.  . Stroke [facility]) ~ 1999; 2001   "briefly lost my vision; lost my right eye" (12/11/2011)    Past Surgical History:  Procedure Laterality Date  . ANTERIOR CERVICAL DECOMP/DISCECTOMY FUSION N/A 01/08/2015   Procedure: Anterior Cervical Three-Four/Four-Five Decompression/Diskectomy/Fusion;  Surgeon: Leeroy Cha, MD;  Location: [place] NEURO ORS;  Service: Neurosurgery;  Laterality: N/A;  C3-4 C4-5 Anterior cervical decompression/diskectomy/fusion  . APPENDECTOMY  ~ 2004  . CATARACT EXTRACTION     right eye  . COLONOSCOPY    . ENUCLEATION  2001   "right"  . ESOPHAGOGASTRODUODENOSCOPY (EGD) WITH PROPOFOL N/A 04/21/2012   Procedure: ESOPHAGOGASTRODUODENOSCOPY (EGD) WITH PROPOFOL;  Surgeon: Milus Banister, MD;  [facility];  Service: Endoscopy;  Laterality: N/A;  . 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 [place]; 1999; 2005   "right"  . MULTIPLE TOOTH EXTRACTIONS    . TONSILLECTOMY    . TOTAL NEPHRECTOMY  1988?; 1994; 2005    There were no vitals filed for this visit.   Subjective Assessment - 03/15/17 1203    Subjective  pt is a 52 y.o F with  CC of cerivicalgia pt has hx of cervical fusion of C3-C4 C4-C5 in December of 2016 and reports that initaly she had no pain but the pain has gradually increased. Over the the course of the past few months the pain seems to be getting worse and is constant. Pain starts at the base of the neck and radiates out to the shoulders, and down theback. pt denies any N/T reporting she only feels pain.     Pertinent History  hx or cervical fusion,    Limitations  Lifting;Walking;Standing    How long can you sit comfortably?  reports a while unable to report    How long can you stand comfortably?  reports a while unable to report    How long can you walk comfortably?  reports a while unable to report     Diagnostic tests  MRI     Patient Stated Goals  to relieve pain as much as possible, improve neck movement    Currently in Pain?  Yes    Pain Score  10-Worst pain ever denied trip to ED due to elevated pain    Pain Location  Neck    Pain Orientation  Lower;Right;Left    Pain Descriptors / Indicators  Aching;Sore;Sharp    Pain Type  Chronic pain    Pain Onset  More than a month ago    Pain Frequency  Constant    Aggravating Factors   looking down, moving shoulders     Pain Relieving Factors  medication, heat    Effect of Pain on Daily Activities  limited mobility with neck motions         [facility] PT Assessment - 03/15/17 1209      Assessment   Medical Diagnosis  cervicalgia    Referring Provider  Ferne Reus PA-C    Onset Date/Surgical Date  -- last few months    Hand Dominance  Right    Next MD Visit  make one PRN    Prior Therapy  yes      Precautions   Precautions  None      Restrictions   Weight Bearing Restrictions  No      Balance Screen   Has the patient fallen in the past 6 months  No    Has the patient had a decrease in activity level because of a fear of falling?   No    Is the patient reluctant to leave their home because of a fear of falling?   No      Home Environment   Living Environment  Private residence    Living Arrangements  Other relatives niece    Available Help at Discharge  Available 24 hours/day;Family    Type of Home  Apartment    Home Access  Level entry 5 steps to get into building     Home Layout  One level    [place] - single point      Prior Function   Level of Independence  Independent    Vocation  On disability    Leisure  Bingo       Cognition   Overall Cognitive Status  Within Functional Limits for tasks assessed      Posture/Postural Control   Posture/Postural Control  Postural limitations    Postural Limitations  Rounded Shoulders;Forward head      ROM / Strength   AROM / PROM / Strength  AROM;Strength      AROM  AROM Assessment  Site  Cervical    Cervical Flexion  50 ERP    Cervical Extension  32 ERP    Cervical - Right Side Bend  25 R sided ERP    Cervical - Left Side Bend  30 R sided ERP    Cervical - Right Rotation  50 R ERP    Cervical - Left Rotation  48 L ERP      Palpation   Palpation comment  TTP along the bil upper trap and levator scapulae R>L, along bil 1st and 2nd costoverbetral facet.  limited 1st and 2nd costovertebral facets are hypomobile             Objective measurements completed on examination: See above findings.                PT Short Term Goals - 03/15/17 1259      PT SHORT TERM GOAL #1   Title  pt to be I with inial HEP    Baseline  no previous HEP    Time  3    Period  Weeks    Status  New    Target Date  04/05/17      PT SHORT TERM GOAL #2   Title  pt to verbalize / demo proper posture in various positions to prevent/ reduce neck/ shoulder pain     Baseline  unaware of posture    Time  3    Period  Weeks    Status  New    Target Date  04/05/17      PT SHORT TERM GOAL #3   Title  Reduce upper trap/ levator scap tightness to reduce pain to </= 8/10 and promote cervical mobility for therapuetic progression     Baseline  10/10 pain with any movement with spasm in bil upper trap/ levator scapulea    Time  3    Period  Weeks    Status  New    Target Date  04/05/17        PT Long Term Goals - 03/15/17 1301      PT LONG TERM GOAL #1   Title  pt to improve cervical flexion and extension by >/= 10 degreees and bil sidebending and rotation by >/=10 degrees with </= 3/10 pain for functional ROM for safety with driving     Baseline  flexion 50, extension 30, R sidebending 25 degrees  and L side bending 30 degrees, R rotation 50 degrees and L rotation 48 degrees with 10/10 reported pain    Time  6    Period  Weeks    Status  New    Target Date  04/26/17      PT LONG TERM GOAL #2   Title  pt will report minimal sleep disturbance to </= 1-2 nights per week      Baseline  initally reports difficulty sleeping every night    Time  6    Period  Weeks    Status  New    Target Date  04/26/17      PT LONG TERM GOAL #3   Title  Pt will have no more than min pain at most 3-4/10  with light housework and ADLs.      Baseline  sever pain with any activity rated at 10/10    Time  6    Period  Weeks    Status  New      PT LONG TERM GOAL #4  Title  pt to be I with all HEP given as of last visit to maintain and promote current level of function    Baseline  no previous HEP     Time  6    Period  Weeks    Status  New    Target Date  04/26/17             Plan - 03/15/17 1253    Clinical Impression Statement  pt presents to OPPT with CC of neck pain with referral to bil shoulders. pt has hx of cerical fusion at C3-C4 and C4-C5 in 2016. She demontrates limitation and ERP wtih all cervical motions. TTP along bil upper trap/ levator scapule and 1st and 2nd bil costovertebral facets. She would benefit from physical therapy to reduce muslce tightness and pain, promote cervical mobility and maximize her function by addressing the deficits listed.     History and Personal Factors relevant to plan of care:  PSH of cervical fusion, PMHx of stroke and osteopenia    Clinical Presentation  Evolving    Clinical Presentation due to:  neck pain, muslce spasm, limited neck mobility    Clinical Decision Making  Moderate    PT Frequency  1x / week    PT Duration  3 weeks progressing to 2 x a week following Medicaid Re-submission    PT Treatment/Interventions  ADLs/Self Care Home Management;Cryotherapy;Electrical Stimulation;Iontophoresis 4mg /ml Dexamethasone;Moist Heat;Ultrasound;Patient/family education;Passive range of motion;Dry needling;Taping;Manual techniques;Therapeutic exercise;Therapeutic activities    PT Next Visit Plan  review/ update HEP, Discuss DN, soft tissue work for upper trap/ levator, cervical rotational PROM and upper/lower cervical mobs, thoracic  mobility, modalities for pain     PT Home Exercise Plan  chin tuck (supine), upper trap/ levator scapulae stretch, upper cervical rotation, 1st rib mobs    Consulted and Agree with Plan of Care  Patient       Patient will benefit from skilled therapeutic intervention in order to improve the following deficits and impairments:  Pain, Improper body mechanics, Postural dysfunction, Decreased mobility, Decreased activity tolerance, Decreased endurance, Decreased strength, Increased fascial restricitons  Visit Diagnosis: Cervicalgia  Other muscle spasm  Abnormal posture     Problem List Patient Active Problem List   Diagnosis Date Noted  . Chest pain 09/29/2015  . Cervical stenosis of spinal canal 01/08/2015  . Urinary tract infectious disease   . Muscle spasms of neck 06/15/2014  . Bleeding hemorrhoid 06/15/2014  . Anemia in chronic kidney disease 06/15/2014  . Acute on chronic renal failure ([place]) 06/13/2014  . Pyelonephritis, acute 06/13/2014  . Sepsis ([place]) 06/13/2014  . History of renal transplant   . Chronic pain disorder 04/09/2012  . Dehydration, mild 04/09/2012  . Gout attack 06/23/2011  . Herpes infection 06/23/2011  . Anxiety 06/23/2011  . E coli bacteremia 06/18/2011  . ESRD (end stage renal disease) ([place]) 06/12/2011  . UTI (lower urinary tract infection) 06/12/2011  . Bacteremia due to Gram-negative bacteria 05/23/2011  . History of renal transplantation 05/22/2011  . Septic shock(785.52) 05/22/2011  . Acute on chronic kidney failure ([place]) 05/22/2011  . Gastroparesis 04/24/2008  . WEIGHT LOSS 08/24/2007  . NAUSEA AND VOMITING 08/24/2007   Starr Lake PT, DPT, LAT, ATC  03/15/17  1:07 PM      [facility] [facility] [address] [place], [place], [address] Phone: [phone]   Fax:  [phone]  Name: Tina Watkins MRN: [id number] Date of Birth: 02/06/1965

## 2017-03-19 ENCOUNTER — Ambulatory Visit (HOSPITAL_COMMUNITY)
Admission: RE | Admit: 2017-03-19 | Discharge: 2017-03-19 | Disposition: A | Discharge status: Home / Self Care | Payer: Medicaid Other | Source: Ambulatory Visit | Attending: Nephrology | Admitting: Nephrology

## 2017-03-19 VITALS — BP 94/57 | HR 63 | Temp 98.8°F | Resp 18

## 2017-03-19 DIAGNOSIS — N185 Chronic kidney disease, stage 5: Secondary | ICD-10-CM | POA: Diagnosis present

## 2017-03-19 DIAGNOSIS — D631 Anemia in chronic kidney disease: Secondary | ICD-10-CM | POA: Diagnosis present

## 2017-03-19 DIAGNOSIS — Z940 Kidney transplant status: Secondary | ICD-10-CM | POA: Diagnosis present

## 2017-03-19 LAB — POCT HEMOGLOBIN-HEMACUE: Hemoglobin: 10.3 g/dL — ABNORMAL LOW (ref 12.0–15.0)

## 2017-03-19 MED ORDER — EPOETIN ALFA 40000 UNIT/ML IJ SOLN
INTRAMUSCULAR | Status: AC
  Administered 2017-03-19: 40000 [IU] via SUBCUTANEOUS
  Filled 2017-03-19: qty 1

## 2017-03-19 MED ORDER — EPOETIN ALFA 20000 UNIT/ML IJ SOLN: 40000.0000 [IU] | INTRAMUSCULAR | Status: DC

## 2017-03-20 LAB — HEPATITIS PANEL, ACUTE
HCV Ab: <0.1 s/co ratio (ref 0.0–0.9)
Hep A IgM: NEGATIVE
Hep B C IgM: NEGATIVE
Hepatitis B Surface Ag: NEGATIVE

## 2017-03-27 IMAGING — CR DG CERVICAL SPINE COMPLETE 4+V
2 series · 2 of 2 positions shown · non-contrast
Comparison: 04/06/2014

CLINICAL DATA: ACDF, C3-C5, for spinal stenosis

EXAM:
CERVICAL SPINE - COMPLETE 4+ VIEW

[AP (1 of 2)]
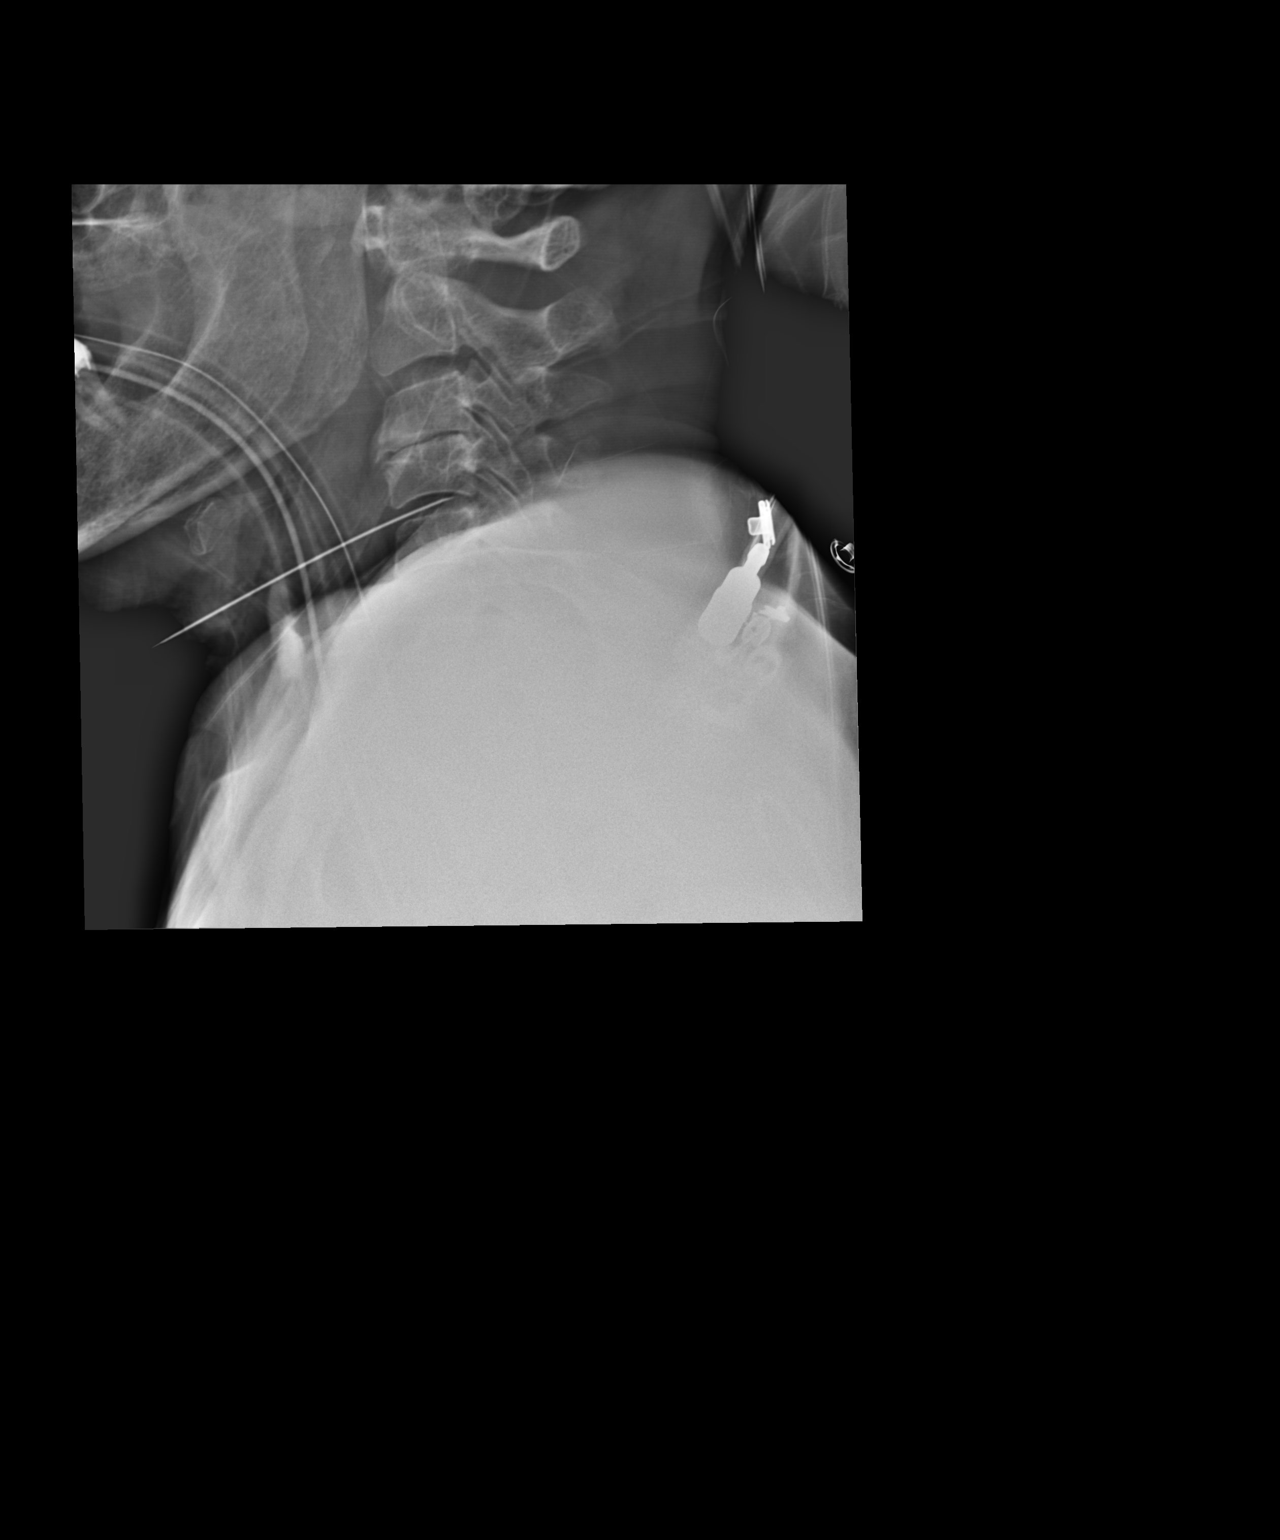

[AP (2 of 2)]
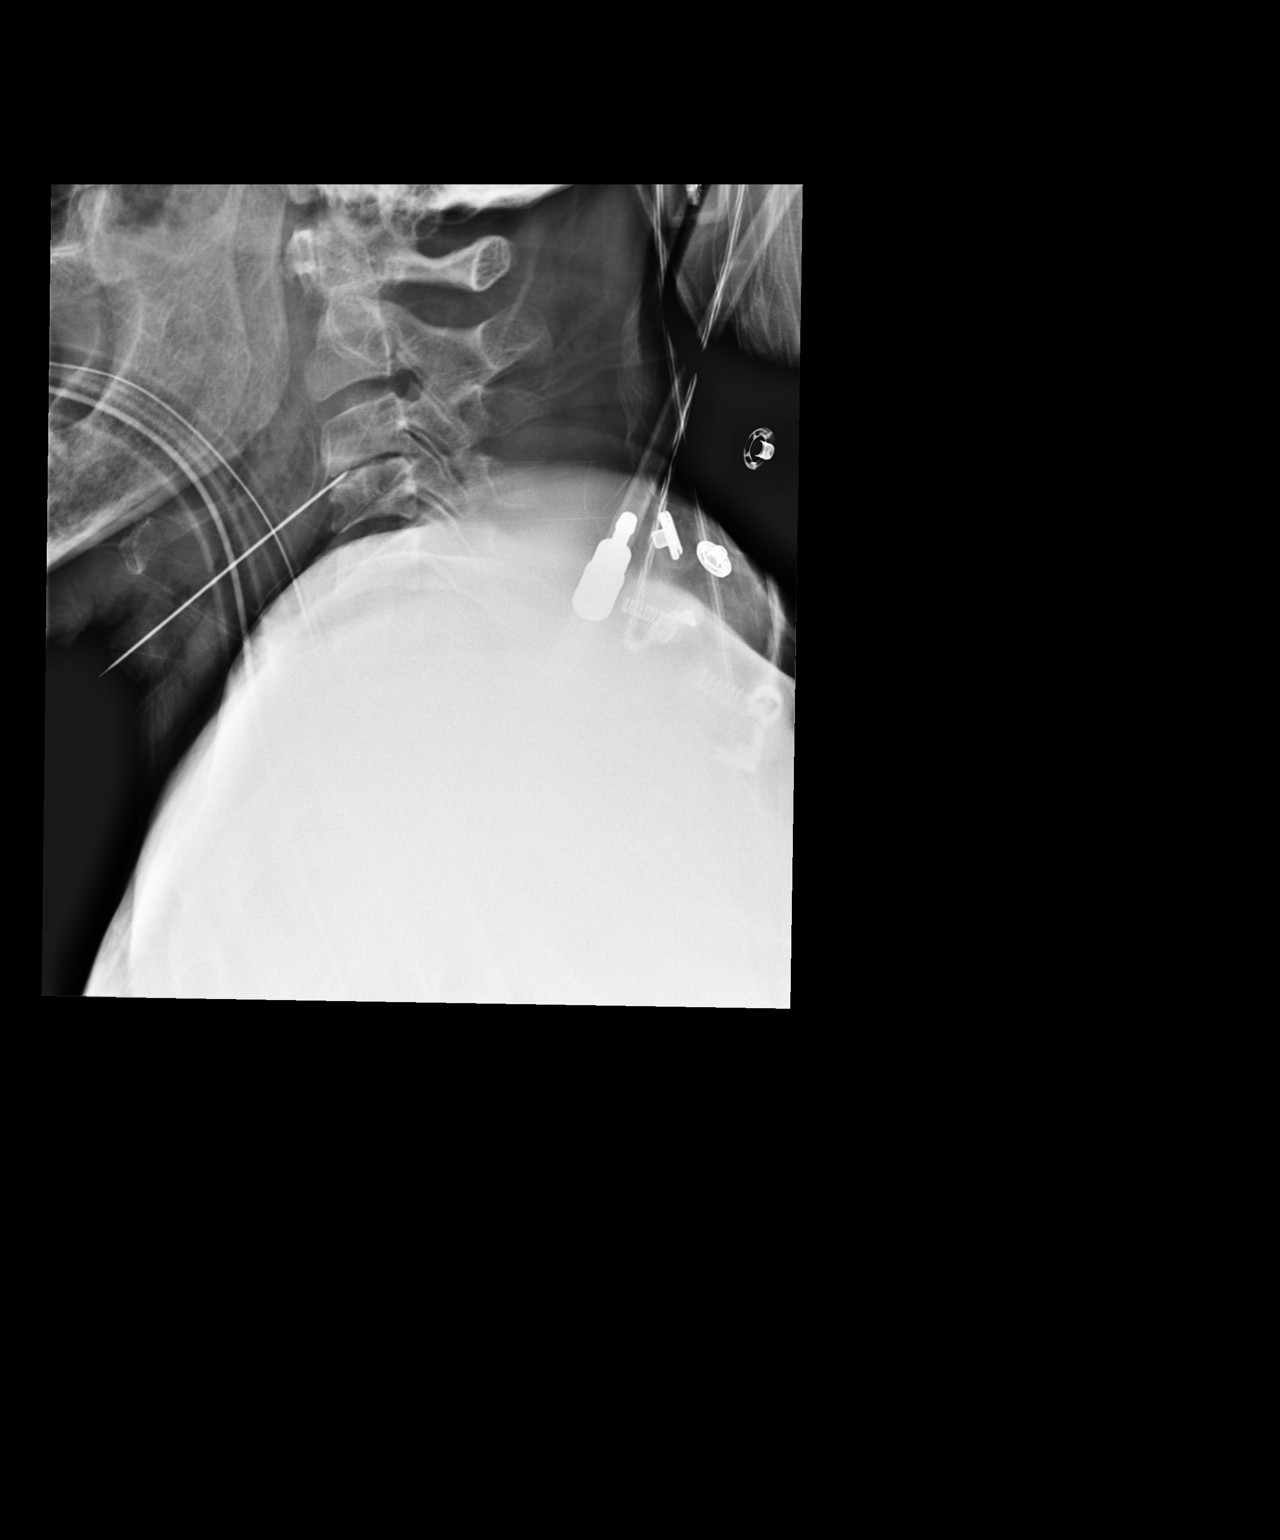

[2 of 2 positions shown; findings below may reference images not displayed]

FINDINGS: Lateral intraoperative radiography shows sequential findings of C3-4
and C4-5 ACDF with ventral plate and screw fixation. Intervertebral
bone graft is located on the final image. The ventral plate is
intact. Noted C3 screws which extend to the superior endplate.
IMPRESSION: Intraoperative radiography for C3-4 and C4-5 ACDF.

## 2017-03-27 IMAGING — CR DG CERVICAL SPINE COMPLETE 4+V
2 series · 2 of 2 positions shown · non-contrast
Comparison: 04/06/2014

CLINICAL DATA: ACDF, C3-C5, for spinal stenosis

EXAM:
CERVICAL SPINE - COMPLETE 4+ VIEW

[lat (1 of 2)]
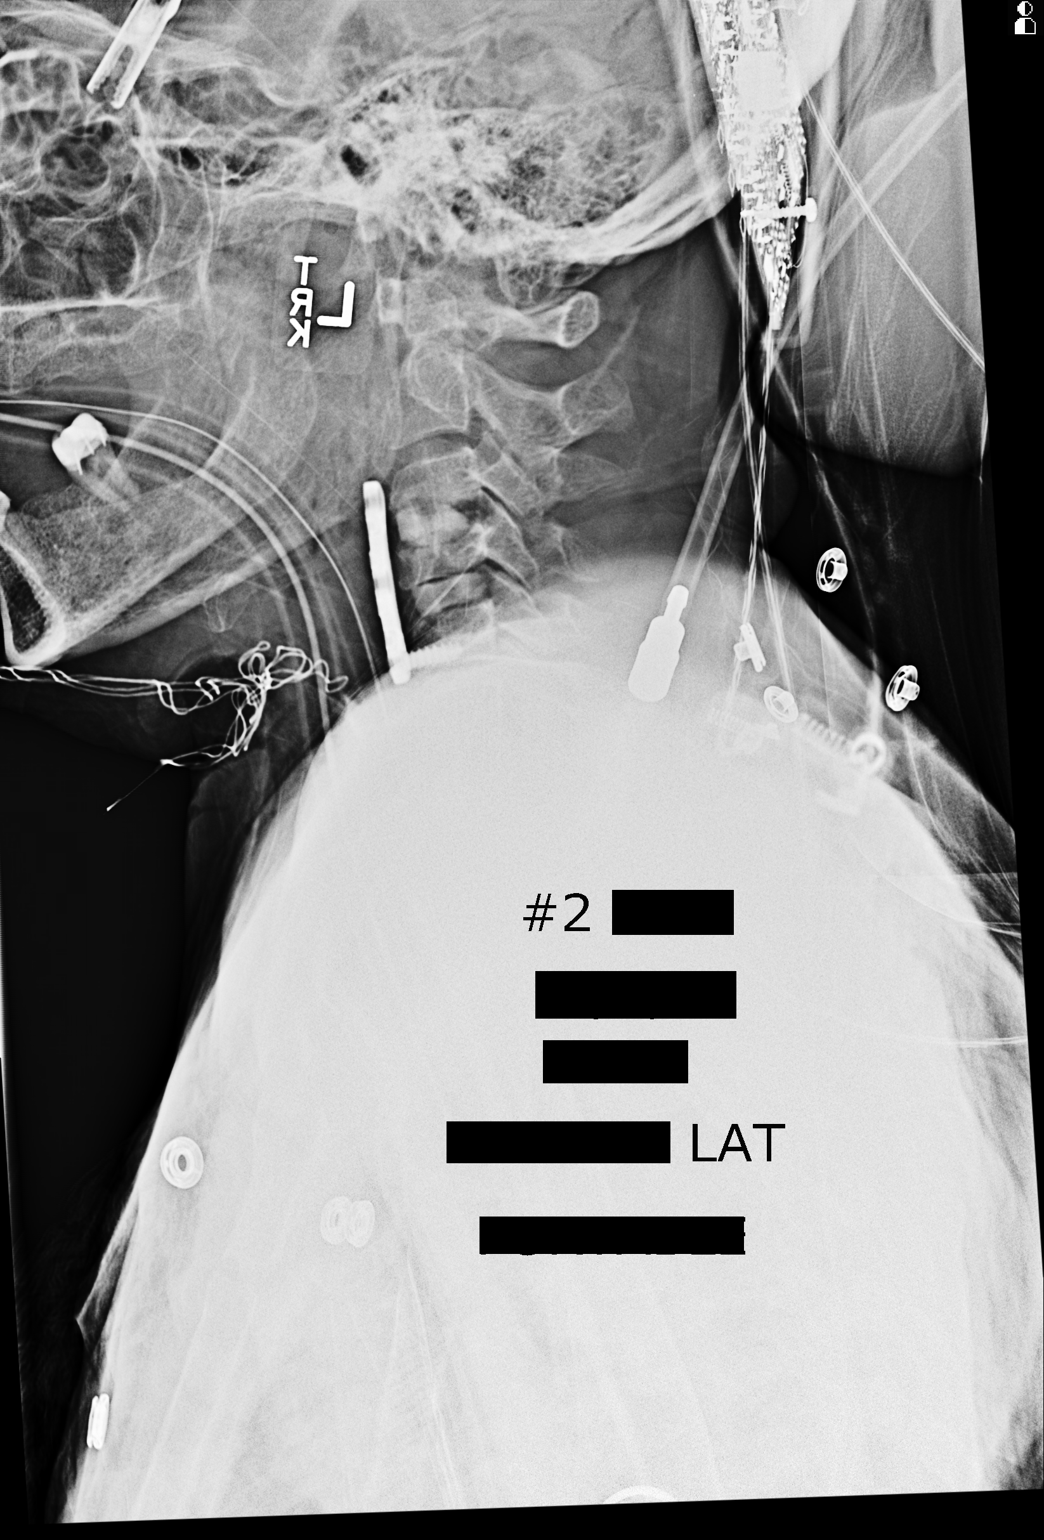

[lat (2 of 2)]
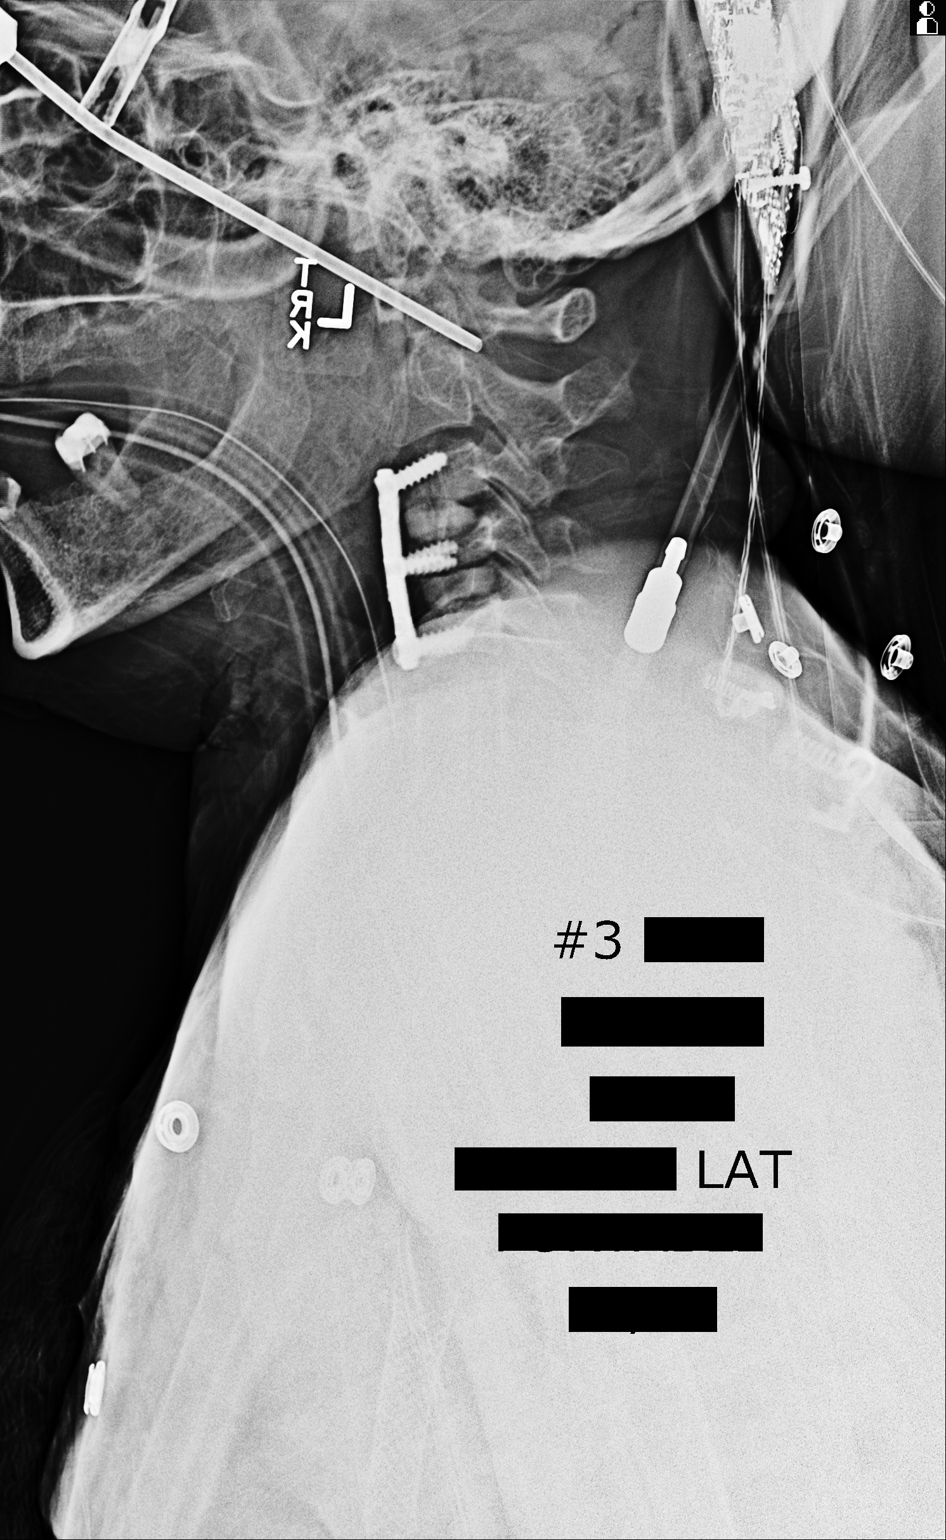

[2 of 2 positions shown; findings below may reference images not displayed]

FINDINGS: Lateral intraoperative radiography shows sequential findings of C3-4
and C4-5 ACDF with ventral plate and screw fixation. Intervertebral
bone graft is located on the final image. The ventral plate is
intact. Noted C3 screws which extend to the superior endplate.
IMPRESSION: Intraoperative radiography for C3-4 and C4-5 ACDF.

## 2017-04-01 ENCOUNTER — Ambulatory Visit: Payer: Medicaid Other | Admitting: Physical Therapy

## 2017-04-02 ENCOUNTER — Encounter (HOSPITAL_COMMUNITY)
Admission: RE | Admit: 2017-04-02 | Discharge: 2017-04-02 | Disposition: A | Discharge status: Home / Self Care | Payer: Medicaid Other | Source: Ambulatory Visit | Attending: Nephrology | Admitting: Nephrology

## 2017-04-02 VITALS — BP 113/76 | HR 68 | Temp 98.7°F | Resp 16

## 2017-04-02 DIAGNOSIS — N185 Chronic kidney disease, stage 5: Secondary | ICD-10-CM | POA: Insufficient documentation

## 2017-04-02 DIAGNOSIS — Z940 Kidney transplant status: Secondary | ICD-10-CM | POA: Diagnosis not present

## 2017-04-02 DIAGNOSIS — D631 Anemia in chronic kidney disease: Secondary | ICD-10-CM | POA: Diagnosis present

## 2017-04-02 LAB — RENAL FUNCTION PANEL
Albumin: 4 g/dL (ref 3.5–5.0)
Anion gap: 10 (ref 5–15)
BUN: 32 mg/dL — ABNORMAL HIGH (ref 6–20)
CO2: 23 mmol/L (ref 22–32)
Calcium: 9.6 mg/dL (ref 8.9–10.3)
Chloride: 109 mmol/L (ref 101–111)
Creatinine, Ser: 1.96 mg/dL — ABNORMAL HIGH (ref 0.44–1.00)
GFR calc Af Amer: 33 mL/min — ABNORMAL LOW (ref >60)
GFR calc non Af Amer: 29 mL/min — ABNORMAL LOW (ref >60)
Glucose, Bld: 81 mg/dL (ref 65–99)
Phosphorus: 3.8 mg/dL (ref 2.5–4.6)
Potassium: 4.9 mmol/L (ref 3.5–5.1)
Sodium: 142 mmol/L (ref 135–145)

## 2017-04-02 LAB — POCT HEMOGLOBIN-HEMACUE: Hemoglobin: 11 g/dL — ABNORMAL LOW (ref 12.0–15.0)

## 2017-04-02 MED ORDER — EPOETIN ALFA 40000 UNIT/ML IJ SOLN
INTRAMUSCULAR | Status: AC
  Administered 2017-04-02: 40000 [IU] via SUBCUTANEOUS
  Filled 2017-04-02: qty 1

## 2017-04-02 MED ORDER — EPOETIN ALFA 20000 UNIT/ML IJ SOLN: 40000.0000 [IU] | INTRAMUSCULAR | Status: DC

## 2017-04-03 LAB — PTH, INTACT AND CALCIUM
Calcium, Total (PTH): 9.8 mg/dL (ref 8.7–10.2)
PTH: 227 pg/mL — ABNORMAL HIGH (ref 15–65)

## 2017-04-05 ENCOUNTER — Encounter: Payer: Self-pay | Admitting: Physical Therapy

## 2017-04-05 ENCOUNTER — Ambulatory Visit: Payer: Medicaid Other | Attending: Physician Assistant | Admitting: Physical Therapy

## 2017-04-05 DIAGNOSIS — M62838 Other muscle spasm: Secondary | ICD-10-CM | POA: Insufficient documentation

## 2017-04-05 DIAGNOSIS — R293 Abnormal posture: Secondary | ICD-10-CM | POA: Diagnosis present

## 2017-04-05 DIAGNOSIS — M542 Cervicalgia: Secondary | ICD-10-CM | POA: Insufficient documentation

## 2017-04-05 NOTE — Therapy (Signed)
[place], [place], [address] Phone: [phone]   Fax:  [phone]  Physical Therapy Treatment  Patient Details  Name: Tina Watkins MRN: [id number] Date of Birth: 07-27-1965 Referring Provider: Ferne Reus PA-C   Encounter Date: 04/05/2017  PT End of Session - 04/05/17 0942    Visit Number  2    Number of Visits  13    Date for PT Re-Evaluation  05/10/17    Authorization Type  MCD    PT Start Time  0941 pt arrived 11 minutes late    PT Stop Time  1015    PT Time Calculation (min)  34 min    Activity Tolerance  Patient tolerated treatment well       Past Medical History:  Diagnosis Date  . Bacteremia due to Gram-negative bacteria 05/23/2011  . Blind right eye   . Chronic lower back pain   . Complication of anesthesia    "woke up during OR; I have an extremely high tolerance" (12/11/2011) 1 procedure was graft; the other procedures were procedures that are typically done with sedation.  . DDD (degenerative disc disease), cervical   . Depression   . Dysrhythmia    "tachycardia" (12/11/2011) new onset afib 10/15/14 EKG  . E coli bacteremia 06/18/2011  . ESRD (end stage renal disease) ([place]) 06/12/2011  . Fibromyalgia   . Gastroparesis   . Gastroparesis   . GERD (gastroesophageal reflux disease)   . Glaucoma    right eye  . Gout   . H/O carpal tunnel syndrome   . Headache(784.0)    "not often anymore" (12/11/2011)  . Herpes genitalia 1994  . History of blood transfusion    "more than a few times" (12/11/2011)  . History of stomach ulcers   . Hypotension   . Iron deficiency anemia   . New onset a-fib ([place])    10/15/14 EKG  . Osteopenia   . Seizures ([place]) 1994   "post transplant; only have had that one" (12/11/2011)  . Spinal stenosis in cervical region   . Stroke [facility])     left basal ganglia lacunar infarct; Right frontal lobe lacunar infarct.  . Stroke [facility]) ~ 1999; 2001   "briefly  lost my vision; lost my right eye" (12/11/2011)    Past Surgical History:  Procedure Laterality Date  . ANTERIOR CERVICAL DECOMP/DISCECTOMY FUSION N/A 01/08/2015   Procedure: Anterior Cervical Three-Four/Four-Five Decompression/Diskectomy/Fusion;  Surgeon: Leeroy Cha, MD;  Location: [facility];  Service: Neurosurgery;  Laterality: N/A;  C3-4 C4-5 Anterior cervical decompression/diskectomy/fusion  . APPENDECTOMY  ~ 2004  . CATARACT EXTRACTION     right eye  . COLONOSCOPY    . ENUCLEATION  2001   "right"  . ESOPHAGOGASTRODUODENOSCOPY (EGD) WITH PROPOFOL N/A 04/21/2012   Procedure: ESOPHAGOGASTRODUODENOSCOPY (EGD) WITH PROPOFOL;  Surgeon: Milus Banister, MD;  Location: [facility];  Service: Endoscopy;  Laterality: N/A;  . 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 [place]; 1999; 2005   "right"  . MULTIPLE TOOTH EXTRACTIONS    . TONSILLECTOMY    . TOTAL NEPHRECTOMY  1988?; 1994; 2005    There were no vitals filed for this visit.  Subjective Assessment - 04/05/17 0943    Subjective  " so far so good, the only exercise i've been doing is the pillow exercise due to having a friend come by  and do exercises"    Currently in Pain?  Yes    Pain Score  8     Pain Location  Neck    Pain Orientation  Lower    Pain Type  Chronic pain    Pain Onset  More than a month ago    Pain Frequency  Constant    Aggravating Factors   anything    Pain Relieving Factors  massage,                       [facility] Adult PT Treatment/Exercise - 04/05/17 0947      Neck Exercises: Seated   Other Seated Exercise  lower trap strengthening 2 x 10 with yellow theraband      Neck Exercises: Supine   Neck Retraction  10 reps;5 secs      Manual Therapy   Manual Therapy  Joint mobilization;Taping    Manual therapy comments  MTPR along bil upper trap    Joint Mobilization  1st rib grade 3 mobs with pt focusing deep  controlled breathing    McConnell  L upper trap inhibition taping trial      Neck Exercises: Stretches   Upper Trapezius Stretch  2 reps;Left;Right;30 seconds contract/ relax with 10 sec contraction             PT Education - 04/05/17 1008    Education provided  Yes    Education Details  reviewed previous HEP and benefits of doing all HEP, benefits of taping and length of wear    Person(s) Educated  Patient    Methods  Explanation;Verbal cues    Comprehension  Verbalized understanding;Verbal cues required       PT Short Term Goals - 03/15/17 1259      PT SHORT TERM GOAL #1   Title  pt to be I with inial HEP    Baseline  no previous HEP    Time  3    Period  Weeks    Status  New    Target Date  04/05/17      PT SHORT TERM GOAL #2   Title  pt to verbalize / demo proper posture in various positions to prevent/ reduce neck/ shoulder pain     Baseline  unaware of posture    Time  3    Period  Weeks    Status  New    Target Date  04/05/17      PT SHORT TERM GOAL #3   Title  Reduce upper trap/ levator scap tightness to reduce pain to </= 8/10 and promote cervical mobility for therapuetic progression     Baseline  10/10 pain with any movement with spasm in bil upper trap/ levator scapulea    Time  3    Period  Weeks    Status  New    Target Date  04/05/17        PT Long Term Goals - 03/15/17 1301      PT LONG TERM GOAL #1   Title  pt to improve cervical flexion and extension by >/= 10 degreees and bil sidebending and rotation by >/=10 degrees with </= 3/10 pain for functional ROM for safety with driving     Baseline  flexion 50, extension 30, R sidebending 25 degrees  and L side bending 30 degrees, R rotation 50 degrees and L rotation 48 degrees with 10/10 reported pain    Time  6    Period  Weeks    Status  New    Target Date  04/26/17      PT LONG TERM GOAL #2   Title  pt will report minimal sleep disturbance to </= 1-2 nights per week     Baseline  initally  reports difficulty sleeping every night    Time  6    Period  Weeks    Status  New    Target Date  04/26/17      PT LONG TERM GOAL #3   Title  Pt will have no more than min pain at most 3-4/10  with light housework and ADLs.      Baseline  sever pain with any activity rated at 10/10    Time  6    Period  Weeks    Status  New      PT LONG TERM GOAL #4   Title  pt to be I with all HEP given as of last visit to maintain and promote current level of function    Baseline  no previous HEP     Time  6    Period  Weeks    Status  New    Target Date  04/26/17            Plan - 04/05/17 1001    Clinical Impression Statement  pt reports she has only done the supine neck exercise due to getting mulitple massages. Discussed importance of doing all exercises. following manual techniques to release bil upper trap stiffness, and sub-occipitals she reported relief of pain. trialed inhibition taping which she reported relief of pain.     PT Next Visit Plan  update HEPDiscuss DN, soft tissue work for upper trap/ levator, cervical rotational PROM and upper/lower cervical mobs, thoracic mobility, modalities for pain, how was inhibition.     PT Home Exercise Plan  chin tuck (supine), upper trap/ levator scapulae stretch, upper cervical rotation, 1st rib mobs,     Consulted and Agree with Plan of Care  Patient       Patient will benefit from skilled therapeutic intervention in order to improve the following deficits and impairments:  Pain, Improper body mechanics, Postural dysfunction, Decreased mobility, Decreased activity tolerance, Decreased endurance, Decreased strength, Increased fascial restricitons  Visit Diagnosis: Cervicalgia  Other muscle spasm  Abnormal posture     Problem List Patient Active Problem List   Diagnosis Date Noted  . Chest pain 09/29/2015  . Cervical stenosis of spinal canal 01/08/2015  . Urinary tract infectious disease   . Muscle spasms of neck 06/15/2014  .  Bleeding hemorrhoid 06/15/2014  . Anemia in chronic kidney disease 06/15/2014  . Acute on chronic renal failure ([place]) 06/13/2014  . Pyelonephritis, acute 06/13/2014  . Sepsis ([place]) 06/13/2014  . History of renal transplant   . Chronic pain disorder 04/09/2012  . Dehydration, mild 04/09/2012  . Gout attack 06/23/2011  . Herpes infection 06/23/2011  . Anxiety 06/23/2011  . E coli bacteremia 06/18/2011  . ESRD (end stage renal disease) ([place]) 06/12/2011  . UTI (lower urinary tract infection) 06/12/2011  . Bacteremia due to Gram-negative bacteria 05/23/2011  . History of renal transplantation 05/22/2011  . Septic shock(785.52) 05/22/2011  . Acute on chronic kidney failure ([place]) 05/22/2011  . Gastroparesis 04/24/2008  . WEIGHT LOSS 08/24/2007  . NAUSEA AND VOMITING 08/24/2007   Starr Lake PT, DPT, LAT, ATC  04/05/17  10:14 AM      [place]  [place], [place], [address] Phone: [phone]   Fax:  [phone]  Name: Tina Watkins MRN: [id number] Date of Birth: [birth year]

## 2017-04-10 ENCOUNTER — Other Ambulatory Visit: Payer: Self-pay | Admitting: Gastroenterology

## 2017-04-13 ENCOUNTER — Ambulatory Visit: Payer: Medicaid Other | Admitting: Physical Therapy

## 2017-04-13 ENCOUNTER — Encounter: Payer: Self-pay | Admitting: Physical Therapy

## 2017-04-13 DIAGNOSIS — M542 Cervicalgia: Secondary | ICD-10-CM

## 2017-04-13 DIAGNOSIS — M62838 Other muscle spasm: Secondary | ICD-10-CM

## 2017-04-13 DIAGNOSIS — R293 Abnormal posture: Secondary | ICD-10-CM

## 2017-04-13 NOTE — Therapy (Signed)
[place], [place], [address] Phone: [phone]   Fax:  [phone]  Physical Therapy Treatment / Re-evaluation Patient Details  Name: Tina Watkins MRN: [id number] Date of Birth: 1965-12-25 Referring Provider: Ferne Reus PA-C   Encounter Date: 04/13/2017  PT End of Session - 04/13/17 1024    Visit Number  3    Number of Visits  13    Date for PT Re-Evaluation  05/10/17    PT Start Time  1024 pt arrived 9 minutes late    PT Stop Time  1100    PT Time Calculation (min)  36 min    Activity Tolerance  Patient tolerated treatment well    Behavior During Therapy  [facility] for tasks assessed/performed       Past Medical History:  Diagnosis Date  . Bacteremia due to Gram-negative bacteria 05/23/2011  . Blind right eye   . Chronic lower back pain   . Complication of anesthesia    "woke up during OR; I have an extremely high tolerance" (12/11/2011) 1 procedure was graft; the other procedures were procedures that are typically done with sedation.  . DDD (degenerative disc disease), cervical   . Depression   . Dysrhythmia    "tachycardia" (12/11/2011) new onset afib 10/15/14 EKG  . E coli bacteremia 06/18/2011  . ESRD (end stage renal disease) ([place]) 06/12/2011  . Fibromyalgia   . Gastroparesis   . Gastroparesis   . GERD (gastroesophageal reflux disease)   . Glaucoma    right eye  . Gout   . H/O carpal tunnel syndrome   . Headache(784.0)    "not often anymore" (12/11/2011)  . Herpes genitalia 1994  . History of blood transfusion    "more than a few times" (12/11/2011)  . History of stomach ulcers   . Hypotension   . Iron deficiency anemia   . New onset a-fib ([place])    10/15/14 EKG  . Osteopenia   . Seizures ([place]) 1994   "post transplant; only have had that one" (12/11/2011)  . Spinal stenosis in cervical region   . Stroke [facility])     left basal ganglia lacunar infarct; Right frontal lobe lacunar  infarct.  . Stroke [facility]) ~ 1999; 2001   "briefly lost my vision; lost my right eye" (12/11/2011)    Past Surgical History:  Procedure Laterality Date  . ANTERIOR CERVICAL DECOMP/DISCECTOMY FUSION N/A 01/08/2015   Procedure: Anterior Cervical Three-Four/Four-Five Decompression/Diskectomy/Fusion;  Surgeon: Leeroy Cha, MD;  Location: [facility];  Service: Neurosurgery;  Laterality: N/A;  C3-4 C4-5 Anterior cervical decompression/diskectomy/fusion  . APPENDECTOMY  ~ 2004  . CATARACT EXTRACTION     right eye  . COLONOSCOPY    . ENUCLEATION  2001   "right"  . ESOPHAGOGASTRODUODENOSCOPY (EGD) WITH PROPOFOL N/A 04/21/2012   Procedure: ESOPHAGOGASTRODUODENOSCOPY (EGD) WITH PROPOFOL;  Surgeon: Milus Banister, MD;  [facility];  Service: Endoscopy;  Laterality: N/A;  . 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 [place]; 1999; 2005   "right"  . MULTIPLE TOOTH EXTRACTIONS    . TONSILLECTOMY    . TOTAL NEPHRECTOMY  1988?; 1994; 2005    There were no vitals filed for this visit.  Subjective Assessment - 04/13/17 1024    Subjective  "I feel like I am doing better and the pain is only a 6-7/10, I did feel  sore and tight this morning"     Patient Stated Goals  to relieve pain as much as possible, improve neck movement    Currently in Pain?  Yes    Pain Score  6     Pain Location  Neck    Pain Orientation  Lower    Pain Descriptors / Indicators  Aching    Pain Type  Chronic pain    Pain Onset  More than a month ago    Pain Frequency  Constant    Aggravating Factors   getting up in the morning,     Pain Relieving Factors  massage, exercise/ stretching         [facility] PT Assessment - 04/13/17 1030      AROM   Cervical Flexion  52    Cervical Extension  38 ERP    Cervical - Right Side Bend  30 ERP noted on the R    Cervical - Left Side Bend  40    Cervical - Right Rotation  60    Cervical - Left  Rotation  57                  [facility] Adult PT Treatment/Exercise - 04/13/17 1246      Self-Care   Other Self-Care Comments   MTPR release and how to perform at home and where to find the tool to assist.       Manual Therapy   McConnell  R upper trap inhibition taping               PT Short Term Goals - 04/13/17 1037      PT SHORT TERM GOAL #1   Title  pt to be I with inial HEP    Baseline  independent with all HEP    Time  3    Period  Weeks    Status  Achieved      PT SHORT TERM GOAL #2   Title  pt to verbalize / demo proper posture in various positions to prevent/ reduce neck/ shoulder pain     Time  3    Period  Weeks    Status  Achieved      PT SHORT TERM GOAL #3   Title  Reduce upper trap/ levator scap tightness to reduce pain to </= 8/10 and promote cervical mobility for therapuetic progression     Baseline  6/10 pain     Time  3    Period  Weeks    Status  Achieved        PT Long Term Goals - 04/13/17 1041      PT LONG TERM GOAL #1   Title  pt to improve cervical flexion and extension by >/= 10 degreees and bil sidebending and rotation by >/=10 degrees with </= 3/10 pain for functional ROM for safety with driving     Baseline  flexion 52, extension 38, R sidebending 25 degrees  and R side bending 30 L side bending 40 degrees, R rotation 60 degrees and L rotation 57 degrees with 6-7/10     Time  4    Period  Weeks    Status  On-going    Target Date  05/11/17      PT LONG TERM GOAL #2   Title  pt will report minimal sleep disturbance to </= 1-2 nights per week     Baseline  pt reports sleeping 1 night a week    Time  4    Period  Weeks    Status  On-going    Target Date  05/11/17      PT LONG TERM GOAL #3   Title  Pt will have no more than min pain at most 3-4/10  with light housework and ADLs.      Baseline  reports 6-7/10 pain today with house work    Time  4    Target Date  05/11/17      PT LONG TERM GOAL #4   Title  pt to be I  with all HEP given as of last visit to maintain and promote current level of function    Baseline  Independent with current HEP and continuing to progress HEP as tolerated.     Time  4    Period  Weeks    Status  On-going    Target Date  05/11/17            Plan - 04/13/17 1249    Clinical Impression Statement  pt arrived 9 min late today. She has attended 2 out of the 3 approved Medicaid visits due to running late and missing an appointment. She is progressing with cervical mobility, decreased muscle spasm and improved pain reponse to 6/10 compared to 8-10/10 previous sessions. she has met all STG's today and is progressing well with LTG and is motivated to continued with treatment to relieve muscle tension and improve her function. She would benefit from physical therapy to continue progression of pain relief, reduce muscle tension, continue working on cervical mobility and shoulder strengthing and maximize overall function and work toward remaining goals and indepedent exercise.      Rehab Potential  Good    PT Frequency  1x / week    PT Duration  4 weeks    PT Treatment/Interventions  ADLs/Self Care Home Management;Cryotherapy;Electrical Stimulation;Iontophoresis '4mg'$ /ml Dexamethasone;Moist Heat;Ultrasound;Patient/family education;Passive range of motion;Dry needling;Taping;Manual techniques;Therapeutic exercise;Therapeutic activities    PT Next Visit Plan  update HEPDiscuss DN, soft tissue work for upper trap/ levator, cervical rotational PROM and upper/lower cervical mobs, thoracic mobility, modalities for pain, how was inhibition.     PT Home Exercise Plan  chin tuck (supine), upper trap/ levator scapulae stretch, upper cervical rotation, 1st rib mobs, MTPR techniques.     Consulted and Agree with Plan of Care  Patient       Patient will benefit from skilled therapeutic intervention in order to improve the following deficits and impairments:  Pain, Improper body mechanics, Postural  dysfunction, Decreased mobility, Decreased activity tolerance, Decreased endurance, Decreased strength, Increased fascial restricitons  Visit Diagnosis: Cervicalgia  Other muscle spasm  Abnormal posture     Problem List Patient Active Problem List   Diagnosis Date Noted  . Chest pain 09/29/2015  . Cervical stenosis of spinal canal 01/08/2015  . Urinary tract infectious disease   . Muscle spasms of neck 06/15/2014  . Bleeding hemorrhoid 06/15/2014  . Anemia in chronic kidney disease 06/15/2014  . Acute on chronic renal failure ([place]) 06/13/2014  . Pyelonephritis, acute 06/13/2014  . Sepsis ([place]) 06/13/2014  . History of renal transplant   . Chronic pain disorder 04/09/2012  . Dehydration, mild 04/09/2012  . Gout attack 06/23/2011  . Herpes infection 06/23/2011  . Anxiety 06/23/2011  . E coli bacteremia 06/18/2011  . ESRD (end stage renal disease) ([place]) 06/12/2011  . UTI (lower urinary tract infection) 06/12/2011  . Bacteremia due to Gram-negative bacteria 05/23/2011  . History of renal transplantation 05/22/2011  .  Septic shock(785.52) 05/22/2011  . Acute on chronic kidney failure ([place]) 05/22/2011  . Gastroparesis 04/24/2008  . WEIGHT LOSS 08/24/2007  . NAUSEA AND VOMITING 08/24/2007   Starr Lake PT, DPT, LAT, ATC  04/13/17  12:58 PM      [facility] [address] [place], [place], [address] Phone: [phone]   Fax:  [phone]  Name: Tina Watkins MRN: [id number] Date of Birth: March 25, 1965

## 2017-04-16 ENCOUNTER — Encounter (HOSPITAL_COMMUNITY)
Admission: RE | Admit: 2017-04-16 | Discharge: 2017-04-16 | Disposition: A | Discharge status: Home / Self Care | Payer: Medicaid Other | Source: Ambulatory Visit | Attending: Nephrology | Admitting: Nephrology

## 2017-04-16 ENCOUNTER — Encounter (HOSPITAL_COMMUNITY): Payer: Medicaid Other

## 2017-04-16 VITALS — BP 103/58 | HR 68 | Temp 98.4°F | Resp 16

## 2017-04-16 DIAGNOSIS — D631 Anemia in chronic kidney disease: Secondary | ICD-10-CM

## 2017-04-16 DIAGNOSIS — N185 Chronic kidney disease, stage 5: Secondary | ICD-10-CM

## 2017-04-16 DIAGNOSIS — Z940 Kidney transplant status: Secondary | ICD-10-CM

## 2017-04-16 LAB — POCT HEMOGLOBIN-HEMACUE: Hemoglobin: 11 g/dL — ABNORMAL LOW (ref 12.0–15.0)

## 2017-04-16 MED ORDER — EPOETIN ALFA 20000 UNIT/ML IJ SOLN
40000.0000 [IU] | INTRAMUSCULAR | Status: DC
  Administered 2017-04-16: 40000 [IU] via SUBCUTANEOUS

## 2017-04-16 MED ORDER — EPOETIN ALFA 40000 UNIT/ML IJ SOLN
INTRAMUSCULAR | Status: AC
  Filled 2017-04-16: qty 1

## 2017-04-19 MED FILL — Epoetin Alfa Inj 40000 Unit/ML: INTRAMUSCULAR | Qty: 1 | Status: AC

## 2017-04-28 ENCOUNTER — Encounter: Payer: Self-pay | Admitting: Physical Therapy

## 2017-04-28 ENCOUNTER — Ambulatory Visit: Payer: Medicaid Other | Admitting: Physical Therapy

## 2017-04-28 DIAGNOSIS — R293 Abnormal posture: Secondary | ICD-10-CM

## 2017-04-28 DIAGNOSIS — M542 Cervicalgia: Secondary | ICD-10-CM | POA: Diagnosis not present

## 2017-04-28 DIAGNOSIS — M62838 Other muscle spasm: Secondary | ICD-10-CM

## 2017-04-28 NOTE — Therapy (Signed)
[place], [place], [address] Phone: [phone]   Fax:  [phone]  Physical Therapy Treatment  Patient Details  Name: Tina Watkins MRN: [id number] Date of Birth: May 28, 1965 Referring Provider: Ferne Reus PA-C   Encounter Date: 04/28/2017  PT End of Session - 04/28/17 1105    Visit Number  4    Number of Visits  13    Date for PT Re-Evaluation  05/10/17    PT Start Time  1016    PT Stop Time  1105    PT Time Calculation (min)  49 min    Activity Tolerance  Patient tolerated treatment well    Behavior During Therapy  [facility] for tasks assessed/performed       Past Medical History:  Diagnosis Date  . Bacteremia due to Gram-negative bacteria 05/23/2011  . Blind right eye   . Chronic lower back pain   . Complication of anesthesia    "woke up during OR; I have an extremely high tolerance" (12/11/2011) 1 procedure was graft; the other procedures were procedures that are typically done with sedation.  . DDD (degenerative disc disease), cervical   . Depression   . Dysrhythmia    "tachycardia" (12/11/2011) new onset afib 10/15/14 EKG  . E coli bacteremia 06/18/2011  . ESRD (end stage renal disease) ([place]) 06/12/2011  . Fibromyalgia   . Gastroparesis   . Gastroparesis   . GERD (gastroesophageal reflux disease)   . Glaucoma    right eye  . Gout   . H/O carpal tunnel syndrome   . Headache(784.0)    "not often anymore" (12/11/2011)  . Herpes genitalia 1994  . History of blood transfusion    "more than a few times" (12/11/2011)  . History of stomach ulcers   . Hypotension   . Iron deficiency anemia   . New onset a-fib ([place])    10/15/14 EKG  . Osteopenia   . Seizures ([place]) 1994   "post transplant; only have had that one" (12/11/2011)  . Spinal stenosis in cervical region   . Stroke [facility])     left basal ganglia lacunar infarct; Right frontal lobe lacunar infarct.  . Stroke [facility]) ~ 1999; 2001   "briefly lost my vision; lost my right eye" (12/11/2011)    Past Surgical History:  Procedure Laterality Date  . ANTERIOR CERVICAL DECOMP/DISCECTOMY FUSION N/A 01/08/2015   Procedure: Anterior Cervical Three-Four/Four-Five Decompression/Diskectomy/Fusion;  Surgeon: Leeroy Cha, MD;  Location: [place] [facility]URO ORS;  Service: Neurosurgery;  Laterality: N/A;  C3-4 C4-5 Anterior cervical decompression/diskectomy/fusion  . APPENDECTOMY  ~ 2004  . CATARACT EXTRACTION     right eye  . COLONOSCOPY    . ENUCLEATION  2001   "right"  . ESOPHAGOGASTRODUODENOSCOPY (EGD) WITH PROPOFOL N/A 04/21/2012   Procedure: ESOPHAGOGASTRODUODENOSCOPY (EGD) WITH PROPOFOL;  Surgeon: Milus Banister, MD;  Location: [facility];  Service: Endoscopy;  Laterality: N/A;  . 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 [place]; 1999; 2005   "right"  . MULTIPLE TOOTH EXTRACTIONS    . TONSILLECTOMY    . TOTAL NEPHRECTOMY  1988?; 1994; 2005    There were no vitals filed for this visit.  Subjective Assessment - 04/28/17 1018    Subjective  "I was doing so good the last time, last Friday and saturday I don't know what caused it to get inflammed"  Currently in Pain?  Yes    Pain Score  7     Pain Location  Neck    Pain Orientation  Lower    Pain Descriptors / Indicators  Aching;Sore                No data recorded       [facility] Adult PT Treatment/Exercise - 04/28/17 1026      Neck Exercises: Machines for Strengthening   Nustep  L4 x 5 min UE/LE      Neck Exercises: Seated   Money  10 reps with green theraband    Other Seated Exercise  lower trap strengthening 2 x 10 with yellow theraband      Neck Exercises: Supine   Neck Retraction  5 reps;10 secs chin tuck head lift      Manual Therapy   Manual therapy comments  MTPR along bil upper trap and levator    Joint Mobilization  1st rib grade 3 mobs with pt focusing deep controlled  breathing    McConnell  b upper trap inhibition taping      Neck Exercises: Stretches   Upper Trapezius Stretch  2 reps;Left;Right;30 seconds PNF contract/ relax with 10 sec contraction    Levator Stretch  1 rep;30 seconds;Left;Right               PT Short Term Goals - 04/13/17 1037      PT SHORT TERM GOAL #1   Title  pt to be I with inial HEP    Baseline  independent with all HEP    Time  3    Period  Weeks    Status  Achieved      PT SHORT TERM GOAL #2   Title  pt to verbalize / demo proper posture in various positions to prevent/ reduce neck/ shoulder pain     Time  3    Period  Weeks    Status  Achieved      PT SHORT TERM GOAL #3   Title  Reduce upper trap/ levator scap tightness to reduce pain to </= 8/10 and promote cervical mobility for therapuetic progression     Baseline  6/10 pain     Time  3    Period  Weeks    Status  Achieved        PT Long Term Goals - 04/13/17 1041      PT LONG TERM GOAL #1   Title  pt to improve cervical flexion and extension by >/= 10 degreees and bil sidebending and rotation by >/=10 degrees with </= 3/10 pain for functional ROM for safety with driving     Baseline  flexion 52, extension 38, R sidebending 25 degrees  and R side bending 30 L side bending 40 degrees, R rotation 60 degrees and L rotation 57 degrees with 6-7/10     Time  4    Period  Weeks    Status  On-going    Target Date  05/11/17      PT LONG TERM GOAL #2   Title  pt will report minimal sleep disturbance to </= 1-2 nights per week     Baseline  pt reports sleeping 1 night a week    Time  4    Period  Weeks    Status  On-going    Target Date  05/11/17      PT LONG TERM GOAL #3   Title  Pt will have no  more than min pain at most 3-4/10  with light housework and ADLs.      Baseline  reports 6-7/10 pain today with house work    Time  4    Target Date  05/11/17      PT LONG TERM GOAL #4   Title  pt to be I with all HEP given as of last visit to maintain  and promote current level of function    Baseline  Independent with current HEP and continuing to progress HEP as tolerated.     Time  4    Period  Weeks    Status  On-going    Target Date  05/11/17            Plan - 04/28/17 1050    Clinical Impression Statement  pt reports increased soreness since the last session which could be related to doing too much due to the fact she was feeling good. continued manual treatment to reduce muscle tension. continued working on scapular stabilizers and cervical stabiliztion which she reported decreased pain.     PT Next Visit Plan  update HEP, Discuss DN, soft tissue work for upper trap/ levator, cervical rotational PROM and upper/lower cervical mobs, thoracic mobility, modalities for pain, how was inhibition.     PT Home Exercise Plan  chin tuck (supine), upper trap/ levator scapulae stretch, upper cervical rotation, 1st rib mobs, MTPR techniques.     Consulted and Agree with Plan of Care  Patient       Patient will benefit from skilled therapeutic intervention in order to improve the following deficits and impairments:  Pain, Improper body mechanics, Postural dysfunction, Decreased mobility, Decreased activity tolerance, Decreased endurance, Decreased strength, Increased fascial restricitons  Visit Diagnosis: Cervicalgia  Other muscle spasm  Abnormal posture     Problem List Patient Active Problem List   Diagnosis Date Noted  . Chest pain 09/29/2015  . Cervical stenosis of spinal canal 01/08/2015  . Urinary tract infectious disease   . Muscle spasms of neck 06/15/2014  . Bleeding hemorrhoid 06/15/2014  . Anemia in chronic kidney disease 06/15/2014  . Acute on chronic renal failure ([place]) 06/13/2014  . Pyelonephritis, acute 06/13/2014  . Sepsis ([place]) 06/13/2014  . History of renal transplant   . Chronic pain disorder 04/09/2012  . Dehydration, mild 04/09/2012  . Gout attack 06/23/2011  . Herpes infection 06/23/2011  . Anxiety  06/23/2011  . E coli bacteremia 06/18/2011  . ESRD (end stage renal disease) ([place]) 06/12/2011  . UTI (lower urinary tract infection) 06/12/2011  . Bacteremia due to Gram-negative bacteria 05/23/2011  . History of renal transplantation 05/22/2011  . Septic shock(785.52) 05/22/2011  . Acute on chronic kidney failure ([place]) 05/22/2011  . Gastroparesis 04/24/2008  . WEIGHT LOSS 08/24/2007  . NAUSEA AND VOMITING 08/24/2007   Starr Lake PT, DPT, LAT, ATC  04/28/17  11:05 AM      [facility] [address] [place], [place], [address] Phone: [phone]   Fax:  [phone]  Name: JUAQUINA MACHNIK MRN: [id number] Date of Birth: [birth year]

## 2017-04-30 ENCOUNTER — Ambulatory Visit (HOSPITAL_COMMUNITY)
Admission: RE | Admit: 2017-04-30 | Discharge: 2017-04-30 | Disposition: A | Discharge status: Home / Self Care | Payer: Medicaid Other | Source: Ambulatory Visit | Attending: Nephrology | Admitting: Nephrology

## 2017-04-30 VITALS — BP 102/57 | HR 69 | Temp 99.1°F | Resp 18

## 2017-04-30 DIAGNOSIS — Z940 Kidney transplant status: Secondary | ICD-10-CM | POA: Diagnosis not present

## 2017-04-30 DIAGNOSIS — N185 Chronic kidney disease, stage 5: Secondary | ICD-10-CM | POA: Insufficient documentation

## 2017-04-30 DIAGNOSIS — D631 Anemia in chronic kidney disease: Secondary | ICD-10-CM

## 2017-04-30 DIAGNOSIS — Z5181 Encounter for therapeutic drug level monitoring: Secondary | ICD-10-CM | POA: Diagnosis not present

## 2017-04-30 DIAGNOSIS — Z79899 Other long term (current) drug therapy: Secondary | ICD-10-CM | POA: Insufficient documentation

## 2017-04-30 LAB — IRON AND TIBC
Iron: 113 ug/dL (ref 28–170)
Saturation Ratios: 51 % — ABNORMAL HIGH (ref 10.4–31.8)
TIBC: 221 ug/dL — ABNORMAL LOW (ref 250–450)
UIBC: 108 ug/dL

## 2017-04-30 LAB — POCT HEMOGLOBIN-HEMACUE: Hemoglobin: 10.1 g/dL — ABNORMAL LOW (ref 12.0–15.0)

## 2017-04-30 LAB — FERRITIN: Ferritin: 929 ng/mL — ABNORMAL HIGH (ref 11–307)

## 2017-04-30 MED ORDER — EPOETIN ALFA 20000 UNIT/ML IJ SOLN: 40000.0000 [IU] | INTRAMUSCULAR | Status: DC

## 2017-04-30 MED ORDER — EPOETIN ALFA 40000 UNIT/ML IJ SOLN
INTRAMUSCULAR | Status: AC
  Administered 2017-04-30: 40000 [IU]
  Filled 2017-04-30: qty 1

## 2017-05-04 ENCOUNTER — Encounter: Payer: Self-pay | Admitting: Physical Therapy

## 2017-05-04 ENCOUNTER — Ambulatory Visit: Payer: Medicaid Other | Attending: Nephrology | Admitting: Physical Therapy

## 2017-05-04 DIAGNOSIS — R293 Abnormal posture: Secondary | ICD-10-CM | POA: Diagnosis present

## 2017-05-04 DIAGNOSIS — M62838 Other muscle spasm: Secondary | ICD-10-CM | POA: Diagnosis present

## 2017-05-04 DIAGNOSIS — M542 Cervicalgia: Secondary | ICD-10-CM

## 2017-05-04 NOTE — Therapy (Signed)
[place], [place], [address] Phone: [phone]   Fax:  [phone]  Physical Therapy Treatment  Patient Details  Name: Tina Watkins MRN: [id number] Date of Birth: 1966-01-20 Referring Provider: Ferne Reus PA-C   Encounter Date: 05/04/2017  PT End of Session - 05/04/17 1104    Visit Number  5    Number of Visits  13    Date for PT Re-Evaluation  05/10/17    PT Start Time  1102    PT Stop Time  1152    PT Time Calculation (min)  50 min    Activity Tolerance  Patient tolerated treatment well    Behavior During Therapy  [facility] for tasks assessed/performed       Past Medical History:  Diagnosis Date  . Bacteremia due to Gram-negative bacteria 05/23/2011  . Blind right eye   . Chronic lower back pain   . Complication of anesthesia    "woke up during OR; I have an extremely high tolerance" (12/11/2011) 1 procedure was graft; the other procedures were procedures that are typically done with sedation.  . DDD (degenerative disc disease), cervical   . Depression   . Dysrhythmia    "tachycardia" (12/11/2011) new onset afib 10/15/14 EKG  . E coli bacteremia 06/18/2011  . ESRD (end stage renal disease) ([place]) 06/12/2011  . Fibromyalgia   . Gastroparesis   . Gastroparesis   . GERD (gastroesophageal reflux disease)   . Glaucoma    right eye  . Gout   . H/O carpal tunnel syndrome   . Headache(784.0)    "not often anymore" (12/11/2011)  . Herpes genitalia 1994  . History of blood transfusion    "more than a few times" (12/11/2011)  . History of stomach ulcers   . Hypotension   . Iron deficiency anemia   . New onset a-fib ([place])    10/15/14 EKG  . Osteopenia   . Seizures ([place]) 1994   "post transplant; only have had that one" (12/11/2011)  . Spinal stenosis in cervical region   . Stroke [facility])     left basal ganglia lacunar infarct; Right frontal lobe lacunar infarct.  . Stroke [facility]) ~ 1999; 2001   "briefly lost my vision; lost my right eye" (12/11/2011)    Past Surgical History:  Procedure Laterality Date  . ANTERIOR CERVICAL DECOMP/DISCECTOMY FUSION N/A 01/08/2015   Procedure: Anterior Cervical Three-Four/Four-Five Decompression/Diskectomy/Fusion;  Surgeon: Leeroy Cha, MD;  Location: [place] NEURO ORS;  Service: Neurosurgery;  Laterality: N/A;  C3-4 C4-5 Anterior cervical decompression/diskectomy/fusion  . APPENDECTOMY  ~ 2004  . CATARACT EXTRACTION     right eye  . COLONOSCOPY    . ENUCLEATION  2001   "right"  . ESOPHAGOGASTRODUODENOSCOPY (EGD) WITH PROPOFOL N/A 04/21/2012   Procedure: ESOPHAGOGASTRODUODENOSCOPY (EGD) WITH PROPOFOL;  Surgeon: Milus Banister, MD;  [facility];  Service: Endoscopy;  Laterality: N/A;  . 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 [place]; 1999; 2005   "right"  . MULTIPLE TOOTH EXTRACTIONS    . TONSILLECTOMY    . TOTAL NEPHRECTOMY  1988?; 1994; 2005    There were no vitals filed for this visit.  Subjective Assessment - 05/04/17 1103    Subjective  "I went today and got the theracane, I am doing pretty good except for my R shoulder which I was laying on last night"  Currently in Pain?  Yes    Pain Score  7     Pain Location  Shoulder    Pain Orientation  Right;Left    Pain Descriptors / Indicators  Aching;Sore    Pain Type  Chronic pain    Aggravating Factors   laying on the side    Pain Relieving Factors  massage, MTPR release                       [facility] Adult PT Treatment/Exercise - 05/04/17 0001      Shoulder Exercises: ROM/Strengthening   Other ROM/Strengthening Exercises  UE ROM protraction and rows with yellow band 2 x 10      Manual Therapy   Manual Therapy  Scapular mobilization    Joint Mobilization  1st rib grade 3 mobs with pt focusing deep controlled breathing    Scapular Mobilization  upward scapular rotation with UE  protraction      Neck Exercises: Stretches   Upper Trapezius Stretch  2 reps;Left;Right;30 seconds    Other Neck Stretches  rhomboid stretch 2x 30 sec clasping hands               PT Short Term Goals - 04/13/17 1037      PT SHORT TERM GOAL #1   Title  pt to be I with inial HEP    Baseline  independent with all HEP    Time  3    Period  Weeks    Status  Achieved      PT SHORT TERM GOAL #2   Title  pt to verbalize / demo proper posture in various positions to prevent/ reduce neck/ shoulder pain     Time  3    Period  Weeks    Status  Achieved      PT SHORT TERM GOAL #3   Title  Reduce upper trap/ levator scap tightness to reduce pain to </= 8/10 and promote cervical mobility for therapuetic progression     Baseline  6/10 pain     Time  3    Period  Weeks    Status  Achieved        PT Long Term Goals - 04/13/17 1041      PT LONG TERM GOAL #1   Title  pt to improve cervical flexion and extension by >/= 10 degreees and bil sidebending and rotation by >/=10 degrees with </= 3/10 pain for functional ROM for safety with driving     Baseline  flexion 52, extension 38, R sidebending 25 degrees  and R side bending 30 L side bending 40 degrees, R rotation 60 degrees and L rotation 57 degrees with 6-7/10     Time  4    Period  Weeks    Status  On-going    Target Date  05/11/17      PT LONG TERM GOAL #2   Title  pt will report minimal sleep disturbance to </= 1-2 nights per week     Baseline  pt reports sleeping 1 night a week    Time  4    Period  Weeks    Status  On-going    Target Date  05/11/17      PT LONG TERM GOAL #3   Title  Pt will have no more than min pain at most 3-4/10  with light housework and ADLs.      Baseline  reports 6-7/10 pain today with house  work    Time  4    Target Date  05/11/17      PT LONG TERM GOAL #4   Title  pt to be I with all HEP given as of last visit to maintain and promote current level of function    Baseline  Independent with  current HEP and continuing to progress HEP as tolerated.     Time  4    Period  Weeks    Status  On-going    Target Date  05/11/17            Plan - 05/04/17 1243    Clinical Impression Statement  pt reported no pain in the neck today and that all pain was located in bil shoulders at the sub-acromial space. focused on shoulder stability and scapular mobility with empahsis on upward rotaiton which she demonstrated very little natural movement and exhibits scapular dyskenisia with L >R. continued MHP post session to calm down soreness which she reported relief of pain/ stiffness.     PT Home Exercise Plan  chin tuck (supine), upper trap/ levator scapulae stretch, upper cervical rotation, 1st rib mobs, MTPR techniques. table slides (protraction), rhomboid stretching    Consulted and Agree with Plan of Care  Patient       Patient will benefit from skilled therapeutic intervention in order to improve the following deficits and impairments:     Visit Diagnosis: Cervicalgia  Other muscle spasm  Abnormal posture     Problem List Patient Active Problem List   Diagnosis Date Noted  . Chest pain 09/29/2015  . Cervical stenosis of spinal canal 01/08/2015  . Urinary tract infectious disease   . Muscle spasms of neck 06/15/2014  . Bleeding hemorrhoid 06/15/2014  . Anemia in chronic kidney disease 06/15/2014  . Acute on chronic renal failure ([place]) 06/13/2014  . Pyelonephritis, acute 06/13/2014  . Sepsis ([place]) 06/13/2014  . History of renal transplant   . Chronic pain disorder 04/09/2012  . Dehydration, mild 04/09/2012  . Gout attack 06/23/2011  . Herpes infection 06/23/2011  . Anxiety 06/23/2011  . E coli bacteremia 06/18/2011  . ESRD (end stage renal disease) ([place]) 06/12/2011  . UTI (lower urinary tract infection) 06/12/2011  . Bacteremia due to Gram-negative bacteria 05/23/2011  . History of renal transplantation 05/22/2011  . Septic shock(785.52) 05/22/2011  . Acute on  chronic kidney failure ([place]) 05/22/2011  . Gastroparesis 04/24/2008  . WEIGHT LOSS 08/24/2007  . NAUSEA AND VOMITING 08/24/2007   Starr Lake PT, DPT, LAT, ATC  05/04/17  12:48 PM      [facility] [address] [place], [place], [address] Phone: [phone]   Fax:  [phone]  Name: JESSALYNN MCCOWAN MRN: [id number] Date of Birth: 1965/04/15

## 2017-05-05 ENCOUNTER — Encounter: Payer: Medicaid Other | Admitting: Physical Therapy

## 2017-05-12 ENCOUNTER — Encounter: Payer: Medicaid Other | Admitting: Physical Therapy

## 2017-05-13 ENCOUNTER — Ambulatory Visit: Payer: Medicaid Other | Admitting: Physical Therapy

## 2017-05-13 ENCOUNTER — Encounter: Payer: Self-pay | Admitting: Physical Therapy

## 2017-05-13 DIAGNOSIS — M542 Cervicalgia: Secondary | ICD-10-CM

## 2017-05-13 DIAGNOSIS — R293 Abnormal posture: Secondary | ICD-10-CM

## 2017-05-13 DIAGNOSIS — M62838 Other muscle spasm: Secondary | ICD-10-CM

## 2017-05-13 NOTE — Therapy (Addendum)
[place], [place], [address] Phone: [phone]   Fax:  [phone]  Physical Therapy Treatment / Re-certification / Discharge Summary  Patient Details  Name: Tina Watkins MRN: [id number] Date of Birth: 18-Dec-1965 Referring Provider: Ferne Reus PA-C   Encounter Date: 05/13/2017  PT End of Session - 05/13/17 1328    Visit Number  6    Number of Visits  13    Date for PT Re-Evaluation  05/24/17    PT Start Time  1331    PT Stop Time  1420    PT Time Calculation (min)  49 min    Activity Tolerance  Patient tolerated treatment well    Behavior During Therapy  [facility] for tasks assessed/performed       Past Medical History:  Diagnosis Date  . Bacteremia due to Gram-negative bacteria 05/23/2011  . Blind right eye   . Chronic lower back pain   . Complication of anesthesia    "woke up during OR; I have an extremely high tolerance" (12/11/2011) 1 procedure was graft; the other procedures were procedures that are typically done with sedation.  . DDD (degenerative disc disease), cervical   . Depression   . Dysrhythmia    "tachycardia" (12/11/2011) new onset afib 10/15/14 EKG  . E coli bacteremia 06/18/2011  . ESRD (end stage renal disease) ([place]) 06/12/2011  . Fibromyalgia   . Gastroparesis   . Gastroparesis   . GERD (gastroesophageal reflux disease)   . Glaucoma    right eye  . Gout   . H/O carpal tunnel syndrome   . Headache(784.0)    "not often anymore" (12/11/2011)  . Herpes genitalia 1994  . History of blood transfusion    "more than a few times" (12/11/2011)  . History of stomach ulcers   . Hypotension   . Iron deficiency anemia   . New onset a-fib ([place])    10/15/14 EKG  . Osteopenia   . Seizures ([place]) 1994   "post transplant; only have had that one" (12/11/2011)  . Spinal stenosis in cervical region   . Stroke [facility])     left basal ganglia lacunar infarct; Right frontal lobe lacunar  infarct.  . Stroke [facility]) ~ 1999; 2001   "briefly lost my vision; lost my right eye" (12/11/2011)    Past Surgical History:  Procedure Laterality Date  . ANTERIOR CERVICAL DECOMP/DISCECTOMY FUSION N/A 01/08/2015   Procedure: Anterior Cervical Three-Four/Four-Five Decompression/Diskectomy/Fusion;  Surgeon: Leeroy Cha, MD;  Location: [place] NEURO ORS;  Service: Neurosurgery;  Laterality: N/A;  C3-4 C4-5 Anterior cervical decompression/diskectomy/fusion  . APPENDECTOMY  ~ 2004  . CATARACT EXTRACTION     right eye  . COLONOSCOPY    . ENUCLEATION  2001   "right"  . ESOPHAGOGASTRODUODENOSCOPY (EGD) WITH PROPOFOL N/A 04/21/2012   Procedure: ESOPHAGOGASTRODUODENOSCOPY (EGD) WITH PROPOFOL;  Surgeon: Milus Banister, MD;  Location: [facility] ENDOSCOPY;  Service: Endoscopy;  Laterality: N/A;  . 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 [facility]; 1999; 2005   "right"  . MULTIPLE TOOTH EXTRACTIONS    . TONSILLECTOMY    . TOTAL NEPHRECTOMY  1988?; 1994; 2005    There were no vitals filed for this visit.  Subjective Assessment - 05/13/17 1334    Subjective  "I notice the R shoulder is the only thing giving me issues. I did get a theracane and  it really helps"     Currently in Pain?  Yes    Pain Score  8     Pain Location  Shoulder    Pain Orientation  Right    Pain Descriptors / Indicators  Aching;Sore    Pain Type  Chronic pain    Pain Onset  More than a month ago    Pain Frequency  Constant    Aggravating Factors   looking to the L and R    Pain Relieving Factors  massage, MTPR          [facility] PT Assessment - 05/13/17 1406      AROM   Cervical Flexion  48    Cervical Extension  38    Cervical - Right Side Bend  28    Cervical - Left Side Bend  40    Cervical - Right Rotation  58    Cervical - Left Rotation  57      Palpation   Palpation comment  TTP along the supraspinatus and infraspinatus , with upper trap  tightness      Special Tests    Special Tests  Rotator Cuff Impingement    Rotator Cuff Impingment tests  Full Can test;Empty Can test                   [facility] Adult PT Treatment/Exercise - 05/13/17 1336      Neck Exercises: Seated   Money  10 reps theraband 2 x 10    Other Seated Exercise  seated thoracic rotation 2 x 10       Neck Exercises: Supine   Neck Retraction  5 reps;10 secs      Shoulder Exercises: ROM/Strengthening   Other ROM/Strengthening Exercises  UE ROM protraction and rows with yellow band 2 x 10      Moist Heat Therapy   Number Minutes Moist Heat  10 Minutes    Moist Heat Location  Cervical;Shoulder R shoulder in supine      Manual Therapy   Manual Therapy  Other (comment)    Manual therapy comments  MTPR along bil upper trap and levator, and supraspinatus    Scapular Mobilization  upward scapular rotation with UE protraction    Other Manual Therapy  desensitization over the R upper trap      Neck Exercises: Stretches   Upper Trapezius Stretch  2 reps;Left;Right;30 seconds             PT Education - 05/13/17 1417    Education provided  Yes    Education Details  reviewed previously provided HEp and benefits of desensitization and how to perform.     Person(s) Educated  Patient    Methods  Explanation;Verbal cues;Handout    Comprehension  Verbalized understanding;Verbal cues required       PT Short Term Goals - 05/13/17 1410      PT SHORT TERM GOAL #1   Title  pt to be I with inial HEP    Time  3    Period  Weeks    Status  Achieved      PT SHORT TERM GOAL #2   Title  pt to verbalize / demo proper posture in various positions to prevent/ reduce neck/ shoulder pain     Time  3    Period  Weeks    Status  Achieved      PT SHORT TERM GOAL #3   Title  Reduce upper trap/ levator scap  tightness to reduce pain to </= 8/10 and promote cervical mobility for therapuetic progression     Time  3    Period  Weeks    Status  Achieved         PT Long Term Goals - 05/13/17 1411      PT LONG TERM GOAL #1   Title  pt to improve cervical flexion and extension by >/= 10 degreees and bil sidebending and rotation by >/=10 degrees with </= 3/10 pain for functional ROM for safety with driving     Time  4    Period  Weeks    Status  On-going      PT LONG TERM GOAL #2   Title  pt will report minimal sleep disturbance to </= 1-2 nights per week     Baseline  pt reports sleeping 1 night a week    Time  4    Period  Weeks    Status  On-going      PT LONG TERM GOAL #3   Title  Pt will have no more than min pain at most 3-4/10  with light housework and ADLs.      Baseline  reports 6-7/10 pain today with house work    Time  4    Period  Weeks    Status  On-going      PT LONG TERM GOAL #4   Title  pt to be I with all HEP given as of last visit to maintain and promote current level of function    Baseline  Independent with current HEP and continuing to progress HEP as tolerated.     Time  4    Period  Weeks    Status  On-going            Plan - 05/13/17 1413    Clinical Impression Statement  mrs. Nasby continues to report pain that is mostly in her R shoudler and is located around the upper trap and supra/infraspinatus. she continues to maintain cervical mobility but reports pain seems to flucutate depending on activity. she may benefit from further imaging to asses the shoulder. If no progress is made in the next few visits plan to discharge and refer back to her MD for futher assessment.     Rehab Potential  Good    PT Frequency  1x / week    PT Duration  2 weeks    PT Treatment/Interventions  ADLs/Self Care Home Management;Cryotherapy;Electrical Stimulation;Iontophoresis '4mg'$ /ml Dexamethasone;Moist Heat;Ultrasound;Patient/family education;Passive range of motion;Dry needling;Taping;Manual techniques;Therapeutic exercise;Therapeutic activities    PT Next Visit Plan  update HEP, Discuss DN, soft tissue work for upper trap/  levator, cervical rotational PROM and upper/lower cervical mobs, thoracic mobility, modalities for pain, how was inhibition.     PT Home Exercise Plan  chin tuck (supine), upper trap/ levator scapulae stretch, upper cervical rotation, 1st rib mobs, MTPR techniques. table slides (protraction), rhomboid stretching, desensitization    Consulted and Agree with Plan of Care  Patient       Patient will benefit from skilled therapeutic intervention in order to improve the following deficits and impairments:  Pain, Improper body mechanics, Postural dysfunction, Decreased mobility, Decreased activity tolerance, Decreased endurance, Decreased strength, Increased fascial restricitons  Visit Diagnosis: Cervicalgia  Other muscle spasm  Abnormal posture     Problem List Patient Active Problem List   Diagnosis Date Noted  . Chest pain 09/29/2015  . Cervical stenosis of spinal canal 01/08/2015  . Urinary tract  infectious disease   . Muscle spasms of neck 06/15/2014  . Bleeding hemorrhoid 06/15/2014  . Anemia in chronic kidney disease 06/15/2014  . Acute on chronic renal failure ([place]) 06/13/2014  . Pyelonephritis, acute 06/13/2014  . Sepsis ([place]) 06/13/2014  . History of renal transplant   . Chronic pain disorder 04/09/2012  . Dehydration, mild 04/09/2012  . Gout attack 06/23/2011  . Herpes infection 06/23/2011  . Anxiety 06/23/2011  . E coli bacteremia 06/18/2011  . ESRD (end stage renal disease) ([place]) 06/12/2011  . UTI (lower urinary tract infection) 06/12/2011  . Bacteremia due to Gram-negative bacteria 05/23/2011  . History of renal transplantation 05/22/2011  . Septic shock(785.52) 05/22/2011  . Acute on chronic kidney failure ([place]) 05/22/2011  . Gastroparesis 04/24/2008  . WEIGHT LOSS 08/24/2007  . NAUSEA AND VOMITING 08/24/2007   Starr Lake PT, DPT, LAT, ATC  05/13/17  2:22 PM      [facility] [address] [place], [place], [address] Phone: [phone]   Fax:  [phone]  Name: Tina Watkins MRN: [id number] Date of Birth: 09-25-1965       PHYSICAL THERAPY DISCHARGE SUMMARY  Visits from Start of Care: 6  Current functional level related to goals / functional outcomes: See goals   Remaining deficits: unknown   Education / Equipment: HEP  Plan: Patient agrees to discharge.  Patient goals were not met. Patient is being discharged due to not returning since the last visit.  ?????         Akashdeep Chuba PT, DPT, LAT, ATC  06/15/17  12:44 PM

## 2017-05-14 ENCOUNTER — Other Ambulatory Visit: Payer: Self-pay | Admitting: Gastroenterology

## 2017-05-14 ENCOUNTER — Ambulatory Visit (HOSPITAL_COMMUNITY)
Admission: RE | Admit: 2017-05-14 | Discharge: 2017-05-14 | Disposition: A | Discharge status: Home / Self Care | Payer: Medicaid Other | Source: Ambulatory Visit | Attending: Nephrology | Admitting: Nephrology

## 2017-05-14 VITALS — BP 111/68 | HR 60 | Temp 98.6°F | Resp 18

## 2017-05-14 DIAGNOSIS — Z940 Kidney transplant status: Secondary | ICD-10-CM | POA: Insufficient documentation

## 2017-05-14 DIAGNOSIS — D631 Anemia in chronic kidney disease: Secondary | ICD-10-CM

## 2017-05-14 DIAGNOSIS — N185 Chronic kidney disease, stage 5: Secondary | ICD-10-CM

## 2017-05-14 LAB — COMPREHENSIVE METABOLIC PANEL
ALT: 10 U/L — ABNORMAL LOW (ref 14–54)
AST: 13 U/L — ABNORMAL LOW (ref 15–41)
Albumin: 3.8 g/dL (ref 3.5–5.0)
Alkaline Phosphatase: 124 U/L (ref 38–126)
Anion gap: 10 (ref 5–15)
BUN: 32 mg/dL — ABNORMAL HIGH (ref 6–20)
CO2: 22 mmol/L (ref 22–32)
Calcium: 9.3 mg/dL (ref 8.9–10.3)
Chloride: 109 mmol/L (ref 101–111)
Creatinine, Ser: 2.06 mg/dL — ABNORMAL HIGH (ref 0.44–1.00)
GFR calc Af Amer: 31 mL/min — ABNORMAL LOW (ref >60)
GFR calc non Af Amer: 27 mL/min — ABNORMAL LOW (ref >60)
Glucose, Bld: 87 mg/dL (ref 65–99)
Potassium: 4.7 mmol/L (ref 3.5–5.1)
Sodium: 141 mmol/L (ref 135–145)
Total Bilirubin: 0.9 mg/dL (ref 0.3–1.2)
Total Protein: 6 g/dL — ABNORMAL LOW (ref 6.5–8.1)

## 2017-05-14 LAB — POCT HEMOGLOBIN-HEMACUE: Hemoglobin: 10.3 g/dL — ABNORMAL LOW (ref 12.0–15.0)

## 2017-05-14 LAB — PHOSPHORUS: Phosphorus: 3.6 mg/dL (ref 2.5–4.6)

## 2017-05-14 MED ORDER — EPOETIN ALFA 20000 UNIT/ML IJ SOLN: 40000.0000 [IU] | INTRAMUSCULAR | Status: DC

## 2017-05-14 MED ORDER — EPOETIN ALFA 40000 UNIT/ML IJ SOLN
INTRAMUSCULAR | Status: AC
  Administered 2017-05-14: 40000 [IU]
  Filled 2017-05-14: qty 1

## 2017-05-16 LAB — TACROLIMUS LEVEL: Tacrolimus (FK506) - LabCorp: 5.7 ng/mL (ref 2.0–20.0)

## 2017-05-18 LAB — CMV DNA BY PCR, QUALITATIVE: CMV DNA, Qual PCR: NEGATIVE

## 2017-05-19 ENCOUNTER — Encounter: Payer: Medicaid Other | Admitting: Physical Therapy

## 2017-05-20 ENCOUNTER — Ambulatory Visit: Payer: Medicaid Other | Admitting: Physical Therapy

## 2017-05-27 ENCOUNTER — Other Ambulatory Visit (HOSPITAL_COMMUNITY): Payer: Self-pay | Admitting: *Deleted

## 2017-05-28 ENCOUNTER — Ambulatory Visit (HOSPITAL_COMMUNITY)
Admission: RE | Admit: 2017-05-28 | Discharge: 2017-05-28 | Disposition: A | Discharge status: Home / Self Care | Payer: Medicaid Other | Source: Ambulatory Visit | Attending: Nephrology | Admitting: Nephrology

## 2017-05-28 VITALS — BP 106/65 | HR 68 | Temp 99.2°F | Resp 18

## 2017-05-28 DIAGNOSIS — D631 Anemia in chronic kidney disease: Secondary | ICD-10-CM | POA: Insufficient documentation

## 2017-05-28 DIAGNOSIS — Z940 Kidney transplant status: Secondary | ICD-10-CM | POA: Diagnosis present

## 2017-05-28 DIAGNOSIS — N185 Chronic kidney disease, stage 5: Secondary | ICD-10-CM | POA: Diagnosis present

## 2017-05-28 LAB — POCT HEMOGLOBIN-HEMACUE: Hemoglobin: 11.6 g/dL — ABNORMAL LOW (ref 12.0–15.0)

## 2017-05-28 MED ORDER — EPOETIN ALFA 40000 UNIT/ML IJ SOLN
INTRAMUSCULAR | Status: AC
  Administered 2017-05-28: 40000 [IU] via SUBCUTANEOUS
  Filled 2017-05-28: qty 1

## 2017-05-28 MED ORDER — EPOETIN ALFA 20000 UNIT/ML IJ SOLN: 40000.0000 [IU] | INTRAMUSCULAR | Status: DC

## 2017-05-31 LAB — BK QUANT PCR (PLASMA/SERUM)
BK Quantitaion PCR: NEGATIVE copies/mL
Log10 BK Qn PCR: UNDETERMINED log10copy/mL

## 2017-05-31 LAB — EPSTEIN BARR VRS(EBV DNA BY PCR)
EBV DNA QN by PCR: 394 copies/mL
log10 EBV DNA Qn PCR: 2.595 log10 copy/mL
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
## 2017-06-05 ENCOUNTER — Emergency Department (HOSPITAL_COMMUNITY): Payer: Medicaid Other

## 2017-06-05 ENCOUNTER — Emergency Department (HOSPITAL_COMMUNITY)
Admission: EM | Admit: 2017-06-05 | Discharge: 2017-06-05 | Disposition: A | Discharge status: Home / Self Care | Payer: Medicaid Other | Attending: Emergency Medicine | Admitting: Emergency Medicine

## 2017-06-05 ENCOUNTER — Other Ambulatory Visit: Payer: Self-pay

## 2017-06-05 ENCOUNTER — Encounter (HOSPITAL_COMMUNITY): Payer: Self-pay | Admitting: *Deleted

## 2017-06-05 DIAGNOSIS — J111 Influenza due to unidentified influenza virus with other respiratory manifestations: Secondary | ICD-10-CM | POA: Diagnosis not present

## 2017-06-05 DIAGNOSIS — N186 End stage renal disease: Secondary | ICD-10-CM | POA: Diagnosis not present

## 2017-06-05 DIAGNOSIS — R509 Fever, unspecified: Secondary | ICD-10-CM | POA: Diagnosis present

## 2017-06-05 DIAGNOSIS — R112 Nausea with vomiting, unspecified: Secondary | ICD-10-CM

## 2017-06-05 DIAGNOSIS — R197 Diarrhea, unspecified: Secondary | ICD-10-CM

## 2017-06-05 DIAGNOSIS — H6592 Unspecified nonsuppurative otitis media, left ear: Secondary | ICD-10-CM | POA: Insufficient documentation

## 2017-06-05 DIAGNOSIS — Z79899 Other long term (current) drug therapy: Secondary | ICD-10-CM | POA: Insufficient documentation

## 2017-06-05 DIAGNOSIS — F17210 Nicotine dependence, cigarettes, uncomplicated: Secondary | ICD-10-CM | POA: Insufficient documentation

## 2017-06-05 DIAGNOSIS — Z7982 Long term (current) use of aspirin: Secondary | ICD-10-CM | POA: Diagnosis not present

## 2017-06-05 DIAGNOSIS — R6889 Other general symptoms and signs: Secondary | ICD-10-CM

## 2017-06-05 LAB — COMPREHENSIVE METABOLIC PANEL
ALT: 6 U/L — ABNORMAL LOW (ref 14–54)
AST: 12 U/L — ABNORMAL LOW (ref 15–41)
Albumin: 3.9 g/dL (ref 3.5–5.0)
Alkaline Phosphatase: 134 U/L — ABNORMAL HIGH (ref 38–126)
Anion gap: 10 (ref 5–15)
BUN: 20 mg/dL (ref 6–20)
CO2: 22 mmol/L (ref 22–32)
Calcium: 9 mg/dL (ref 8.9–10.3)
Chloride: 106 mmol/L (ref 101–111)
Creatinine, Ser: 2.18 mg/dL — ABNORMAL HIGH (ref 0.44–1.00)
GFR calc Af Amer: 29 mL/min — ABNORMAL LOW (ref >60)
GFR calc non Af Amer: 25 mL/min — ABNORMAL LOW (ref >60)
Glucose, Bld: 102 mg/dL — ABNORMAL HIGH (ref 65–99)
Potassium: 4.9 mmol/L (ref 3.5–5.1)
Sodium: 138 mmol/L (ref 135–145)
Total Bilirubin: 1 mg/dL (ref 0.3–1.2)
Total Protein: 6.6 g/dL (ref 6.5–8.1)

## 2017-06-05 LAB — URINALYSIS, ROUTINE W REFLEX MICROSCOPIC
Bacteria, UA: NONE SEEN
Bilirubin Urine: NEGATIVE
Glucose, UA: NEGATIVE mg/dL
Hgb urine dipstick: NEGATIVE
Ketones, ur: NEGATIVE mg/dL
Nitrite: NEGATIVE
Protein, ur: 30 mg/dL — AB
Specific Gravity, Urine: 1.019 (ref 1.005–1.030)
pH: 5 (ref 5.0–8.0)

## 2017-06-05 LAB — CBC
HCT: 34.1 % — ABNORMAL LOW (ref 36.0–46.0)
Hemoglobin: 11 g/dL — ABNORMAL LOW (ref 12.0–15.0)
MCH: 30.1 pg (ref 26.0–34.0)
MCHC: 32.3 g/dL (ref 30.0–36.0)
MCV: 93.2 fL (ref 78.0–100.0)
Platelets: 242 10*3/uL (ref 150–400)
RBC: 3.66 MIL/uL — ABNORMAL LOW (ref 3.87–5.11)
RDW: 21.6 % — ABNORMAL HIGH (ref 11.5–15.5)
WBC: 8.2 10*3/uL (ref 4.0–10.5)

## 2017-06-05 LAB — I-STAT CG4 LACTIC ACID, ED: Lactic Acid, Venous: 0.95 mmol/L (ref 0.5–1.9)

## 2017-06-05 LAB — I-STAT BETA HCG BLOOD, ED (MC, WL, AP ONLY): I-stat hCG, quantitative: 5.1 m[IU]/mL — ABNORMAL HIGH (ref <5)

## 2017-06-05 LAB — INFLUENZA PANEL BY PCR (TYPE A & B)
Influenza A By PCR: NEGATIVE
Influenza B By PCR: NEGATIVE

## 2017-06-05 LAB — LIPASE, BLOOD: Lipase: 28 U/L (ref 11–51)

## 2017-06-05 MED ORDER — ACETAMINOPHEN 500 MG PO TABS
1000.0000 mg | ORAL_TABLET | Freq: Once | ORAL | Status: AC
  Administered 2017-06-05: 1000 mg via ORAL
  Filled 2017-06-05: qty 2

## 2017-06-05 MED ORDER — FENTANYL CITRATE (PF) 100 MCG/2ML IJ SOLN
50.0000 ug | Freq: Once | INTRAMUSCULAR | Status: AC
  Administered 2017-06-05: 50 ug via INTRAVENOUS
  Filled 2017-06-05: qty 2

## 2017-06-05 MED ORDER — ONDANSETRON HCL 4 MG/2ML IJ SOLN
4.0000 mg | Freq: Once | INTRAMUSCULAR | Status: AC
Start: 2017-06-05 — End: 2017-06-05
  Administered 2017-06-05: 4 mg via INTRAVENOUS
  Filled 2017-06-05: qty 2

## 2017-06-05 MED ORDER — ALBUTEROL SULFATE HFA 108 (90 BASE) MCG/ACT IN AERS
2.0000 | INHALATION_SPRAY | Freq: Once | RESPIRATORY_TRACT | Status: AC
  Administered 2017-06-05: 2 via RESPIRATORY_TRACT
  Filled 2017-06-05: qty 6.7

## 2017-06-05 MED ORDER — AZITHROMYCIN 250 MG PO TABS
500.0000 mg | ORAL_TABLET | Freq: Once | ORAL | Status: AC
  Administered 2017-06-05: 500 mg via ORAL
  Filled 2017-06-05: qty 2

## 2017-06-05 MED ORDER — AZITHROMYCIN 250 MG PO TABS: 250.0000 mg | ORAL_TABLET | Freq: Every day | ORAL | 0 refills | Status: DC

## 2017-06-05 MED ORDER — ONDANSETRON 4 MG PO TBDP: 4.0000 mg | ORAL_TABLET | Freq: Four times a day (QID) | ORAL | 0 refills | Status: DC | PRN

## 2017-06-05 MED ORDER — SODIUM CHLORIDE 0.9 % IV BOLUS
1000.0000 mL | Freq: Once | INTRAVENOUS | Status: AC
  Administered 2017-06-05: 1000 mL via INTRAVENOUS

## 2017-06-05 NOTE — ED Notes (Signed)
Pt verbalized understanding of discharge instructions. Pt getting dressed.

## 2017-06-05 NOTE — ED Triage Notes (Signed)
She has had these symptoms for 3 weeks

## 2017-06-05 NOTE — ED Triage Notes (Signed)
The pt has generalized body aches  Fever chills sore throat headache back pain nv diarrhea  She has had a kidney transplant 2005

## 2017-06-05 NOTE — Discharge Instructions (Addendum)
You may use Tylenol 1000 mg every 6 hours as needed for fever and pain.  You may use over-the-counter Mucinex for cough.  You may use over-the-counter Afrin as needed for nasal ingestion twice a day but do not use this medication for more than 3 days in a row as it may worsen your nasal congestion.  You may use over-the-counter Imodium as needed for diarrhea.  I recommend a bland diet for the next several days and increase water intake.  Your labs, chest x-ray, urine today were normal.  Your flu swab is negative.  You do have blood cultures pending.  If these are positive, you will be contacted to return to the ED.  You may use your albuterol inhaler 2 to 4 puffs every 2-4 hours as needed for shortness of breath and wheezing.

## 2017-06-05 NOTE — ED Notes (Signed)
Pt request that we collect 2nd set blood culture when nurse starts IV.  Notified nurse as well as updated the information board in room.

## 2017-06-05 NOTE — ED Triage Notes (Signed)
The pt reports that she saw her doctor yesterday chest congestion and productive cough

## 2017-06-05 NOTE — ED Provider Notes (Signed)
TIME SEEN: 5:16 AM  CHIEF COMPLAINT: Flulike symptoms  HPI: Patient is a 52 year old female with history of renal transplant currently on Prograf, prednisone, azathioprine, previous stroke who presents to the emergency department with flulike symptoms for the past day.  Has had fevers, left ear pain, sore throat, nasal congestion, nonproductive cough, vomiting and diarrhea.  Also reports that she thinks she is having a gout flare of her left ankle.  She is complaining of diffuse body aches.  ROS: See HPI Constitutional: no fever  Eyes: no drainage  ENT: no runny nose   Cardiovascular:  no chest pain  Resp: no SOB  GI: no vomiting GU: no dysuria Integumentary: no rash  Allergy: no hives  Musculoskeletal: no leg swelling  Neurological: no slurred speech ROS otherwise negative  PAST MEDICAL HISTORY/PAST SURGICAL HISTORY:  Past Medical History:  Diagnosis Date  . Bacteremia due to Gram-negative bacteria 05/23/2011  . Blind right eye   . Chronic lower back pain   . Complication of anesthesia    "woke up during OR; I have an extremely high tolerance" (12/11/2011) 1 procedure was graft; the other procedures were procedures that are typically done with sedation.  . DDD (degenerative disc disease), cervical   . Depression   . Dysrhythmia    "tachycardia" (12/11/2011) new onset afib 10/15/14 EKG  . E coli bacteremia 06/18/2011  . ESRD (end stage renal disease) ([place]) 06/12/2011  . Fibromyalgia   . Gastroparesis   . Gastroparesis   . GERD (gastroesophageal reflux disease)   . Glaucoma    right eye  . Gout   . H/O carpal tunnel syndrome   . Headache(784.0)    "not often anymore" (12/11/2011)  . Herpes genitalia 1994  . History of blood transfusion    "more than a few times" (12/11/2011)  . History of stomach ulcers   . Hypotension   . Iron deficiency anemia   . New onset a-fib ([place])    10/15/14 EKG  . Osteopenia   . Seizures ([place]) 1994   "post transplant; only have had that one"  (12/11/2011)  . Spinal stenosis in cervical region   . Stroke [facility])     left basal ganglia lacunar infarct; Right frontal lobe lacunar infarct.  . Stroke [facility]) ~ 1999; 2001   "briefly lost my vision; lost my right eye" (12/11/2011)    MEDICATIONS:  Prior to Admission medications   Medication Sig Start Date End Date Taking? Authorizing Provider  ALPRAZolam (XANAX) 0.25 MG tablet Take 0.25 mg by mouth daily as needed (FOR SVT).    [provider]  aspirin EC 81 MG tablet Take 81 mg by mouth every morning.     [provider]  azaTHIOprine (IMURAN) 50 MG tablet Take 100 mg by mouth daily.     [provider]  colchicine 0.6 MG tablet Take 0.6 mg by mouth daily as needed (For gout.).    [provider]  metoCLOPramide (REGLAN) 5 MG tablet TAKE 1 TABLET BY MOUTH AT BEDTIME AND MAY REPEAT DOSE ONE TIME IF NEEDED 10/20/16   Milus Banister, MD  metoCLOPramide (REGLAN) 5 MG tablet TAKE 1 TABLET BY MOUTH TWICE A DAY AT 8AM AND 10 PM 05/14/17   Milus Banister, MD  omeprazole (PRILOSEC) 40 MG capsule Take 40 mg by mouth 2 (two) times daily.     [provider]  oxyCODONE-acetaminophen (PERCOCET) 10-325 MG tablet Take 1 tablet by mouth every 6 (six) hours as needed for pain.  [provider]  predniSONE (DELTASONE) 5 MG tablet Take 5 mg by mouth daily. 06/22/11   [name], MD  promethazine (PHENERGAN) 25 MG tablet Take 1 tablet (25 mg total) by mouth every 6 (six) hours as needed for nausea or vomiting. 06/18/15   Antonietta Breach, PA-C  tacrolimus (PROGRAF) 1 MG capsule Take 3 mg by mouth 2 (two) times daily.  06/22/11   [name], MD  zolpidem (AMBIEN) 10 MG tablet Take 10 mg by mouth at bedtime as needed for sleep.    [provider]    ALLERGIES:  Allergies  Allergen Reactions  . Levofloxacin Itching and Rash    SOCIAL HISTORY:  Social History   Tobacco Use  . Smoking status: Current Every Day Smoker     Packs/day: 0.20    Years: 28.00    Pack years: 5.60    Types: Cigarettes  . Smokeless tobacco: Never Used  . Tobacco comment: had quit 03/05/2011  Substance Use Topics  . Alcohol use: Yes    Comment: rare    FAMILY HISTORY: Family History  Problem Relation Age of Onset  . Glaucoma Mother   . Multiple sclerosis Brother   . Hypertension Maternal Grandmother   . Breast cancer Neg Hx     EXAM: BP 90/60   Pulse 77   Temp 99.6 F (37.6 C)   Resp (!) 24   Ht 4\' 11"  (1.499 m)   Wt 49.9 kg (110 lb)   SpO2 (!) 9%   BMI 22.22 kg/m  CONSTITUTIONAL: Alert and oriented and responds appropriately to questions.  Appears uncomfortable but is nontoxic appearing HEAD: Normocephalic EYES: Conjunctivae clear, pupils appear equal, EOMI ENT: normal nose; moist mucous membranes; No pharyngeal erythema or petechiae, no tonsillar hypertrophy or exudate, no uvular deviation, no unilateral swelling, no trismus or drooling, no muffled voice, normal phonation, no stridor, no dental caries present, no drainable dental abscess noted, no Ludwig's angina, tongue sits flat in the bottom of the mouth, no angioedema, no facial erythema or warmth, no facial swelling; no pain with movement of the neck.  Patient's left TM is erythematous, bulging with clear effusion but no perforation or drainage.  Right TM appears normal.  No cerumen impaction or sign of foreign body in the external auditory canal. No inflammation, erythema or drainage from the external auditory canal. No signs of mastoiditis. No pain with manipulation of the pinna bilaterally. NECK: Supple, no meningismus, no nuchal rigidity, no LAD  CARD: RRR; S1 and S2 appreciated; no murmurs, no clicks, no rubs, no gallops RESP: Normal chest excursion without splinting or tachypnea; breath sounds clear and equal bilaterally; no wheezes, no rhonchi, no rales, no hypoxia or respiratory distress, speaking full sentences ABD/GI: Normal bowel sounds; non-distended;  soft, non-tender, no rebound, no guarding, no peritoneal signs, no hepatosplenomegaly BACK:  The back appears normal and is non-tender to palpation, there is no CVA tenderness EXT: Tender to palpation diffusely of the right ankle without joint effusion or significant erythema or warmth.  2+ DP pulses bilaterally.  Decreased range of motion in the right ankle secondary to pain but otherwise normal ROM in all joints; extremities are non-tender to palpation; no edema; normal capillary refill; no cyanosis, no calf tenderness or swelling    SKIN: Normal color for age and race; warm; no rash NEURO: Moves all extremities equally PSYCH: The patient's mood and manner are appropriate. Grooming and personal hygiene are appropriate.  MEDICAL DECISION MAKING: Patient here with  complaints of fevers, body aches and flulike symptoms.  She does appear to have a left otitis media on examination.  Her blood pressure is soft but she states this is her baseline.  I do not think she has meningitis.  Work-up obtained in triage is unremarkable including normal labs (other than elevated creatinine which is her baseline), urine, chest x-ray.  Will give IV fluids, pain and nausea medicine.  Flu swab, blood cultures pending given patient is immunocompromised.  Doubt meningitis.  Doubt sepsis.  Her blood pressure is low but she states this is chronic and on review of her records it does appear that her systolic blood pressure is normally in the 80s to 90s.  ED PROGRESS: Patient reports feeling better.  Blood cultures are pending but again low suspicion for sepsis, bacteremia but she is at risk given she is immunocompromised.  Her flu swab is negative.  We discussed that blood cultures are pending and if that they are positive she will be contacted to return to the ED.  She has outpatient follow-up.  I still suspect viral syndrome.  She has cough and has an intermittent wheezing but clear lungs currently.  Will discharge with albuterol  inhaler and discharged on azithromycin for bronchitis in the setting of being immunocompromise as well as a left otitis media.  Recommended Tylenol for fever and pain at home.  She is on a pain management contract and has narcotics at home.  Will discharge with Zofran for nausea and vomiting.  No vomiting here and has been tolerating p.o.  Recommend over-the-counter Imodium for diarrhea.  Discussed return precautions.  Patient verbalized understanding and is comfortable with this plan.   At this time, I do not feel there is any life-threatening condition present. I have reviewed and discussed all results (EKG, imaging, lab, urine as appropriate) and exam findings with patient/family. I have reviewed nursing notes and appropriate previous records.  I feel the patient is safe to be discharged home without further emergent workup and can continue workup as an outpatient as needed. Discussed usual and customary return precautions. Patient/family verbalize understanding and are comfortable with this plan.  Outpatient follow-up has been provided if needed. All questions have been answered.      Edgar Corrigan, Delice Bison, DO 06/05/17 [phone]

## 2017-06-10 ENCOUNTER — Other Ambulatory Visit (HOSPITAL_COMMUNITY): Payer: Self-pay | Admitting: *Deleted

## 2017-06-10 LAB — CULTURE, BLOOD (ROUTINE X 2)
Culture: NO GROWTH
Culture: NO GROWTH
Special Requests: ADEQUATE
Special Requests: ADEQUATE

## 2017-06-11 ENCOUNTER — Encounter (HOSPITAL_COMMUNITY): Payer: Medicaid Other

## 2017-06-16 ENCOUNTER — Ambulatory Visit (HOSPITAL_COMMUNITY)
Admission: RE | Admit: 2017-06-16 | Discharge: 2017-06-16 | Disposition: A | Discharge status: Home / Self Care | Payer: Medicaid Other | Source: Ambulatory Visit | Attending: Nephrology | Admitting: Nephrology

## 2017-06-16 VITALS — BP 94/63 | HR 65 | Temp 99.0°F | Resp 18

## 2017-06-16 DIAGNOSIS — N185 Chronic kidney disease, stage 5: Secondary | ICD-10-CM | POA: Diagnosis present

## 2017-06-16 DIAGNOSIS — Z940 Kidney transplant status: Secondary | ICD-10-CM | POA: Diagnosis present

## 2017-06-16 DIAGNOSIS — D631 Anemia in chronic kidney disease: Secondary | ICD-10-CM | POA: Diagnosis present

## 2017-06-16 LAB — POCT HEMOGLOBIN-HEMACUE: Hemoglobin: 9.7 g/dL — ABNORMAL LOW (ref 12.0–15.0)

## 2017-06-16 MED ORDER — EPOETIN ALFA 40000 UNIT/ML IJ SOLN
INTRAMUSCULAR | Status: AC
  Administered 2017-06-16: 40000 [IU]
  Filled 2017-06-16: qty 1

## 2017-06-16 MED ORDER — EPOETIN ALFA 20000 UNIT/ML IJ SOLN: 40000.0000 [IU] | INTRAMUSCULAR | Status: DC

## 2017-06-22 ENCOUNTER — Other Ambulatory Visit: Payer: Self-pay | Admitting: Gastroenterology

## 2017-06-25 ENCOUNTER — Encounter (HOSPITAL_COMMUNITY): Payer: Medicaid Other

## 2017-06-30 ENCOUNTER — Encounter (HOSPITAL_COMMUNITY): Payer: Medicaid Other

## 2017-07-02 ENCOUNTER — Ambulatory Visit (HOSPITAL_COMMUNITY)
Admission: RE | Admit: 2017-07-02 | Discharge: 2017-07-02 | Disposition: A | Discharge status: Home / Self Care | Payer: Medicaid Other | Source: Ambulatory Visit | Attending: Nephrology | Admitting: Nephrology

## 2017-07-02 VITALS — BP 90/61 | HR 72 | Resp 18

## 2017-07-02 DIAGNOSIS — Z940 Kidney transplant status: Secondary | ICD-10-CM | POA: Diagnosis present

## 2017-07-02 DIAGNOSIS — N185 Chronic kidney disease, stage 5: Secondary | ICD-10-CM

## 2017-07-02 DIAGNOSIS — D631 Anemia in chronic kidney disease: Secondary | ICD-10-CM

## 2017-07-02 LAB — FERRITIN: Ferritin: 1149 ng/mL — ABNORMAL HIGH (ref 11–307)

## 2017-07-02 LAB — POCT HEMOGLOBIN-HEMACUE: Hemoglobin: 9.3 g/dL — ABNORMAL LOW (ref 12.0–15.0)

## 2017-07-02 LAB — IRON AND TIBC
Iron: 56 ug/dL (ref 28–170)
Saturation Ratios: 28 % (ref 10.4–31.8)
TIBC: 199 ug/dL — ABNORMAL LOW (ref 250–450)
UIBC: 143 ug/dL

## 2017-07-02 MED ORDER — EPOETIN ALFA 40000 UNIT/ML IJ SOLN
INTRAMUSCULAR | Status: AC
  Administered 2017-07-02: 40000 [IU] via SUBCUTANEOUS
  Filled 2017-07-02: qty 1

## 2017-07-02 MED ORDER — EPOETIN ALFA 20000 UNIT/ML IJ SOLN: 40000.0000 [IU] | INTRAMUSCULAR | Status: DC

## 2017-07-16 ENCOUNTER — Encounter (HOSPITAL_COMMUNITY)
Admission: RE | Admit: 2017-07-16 | Discharge: 2017-07-16 | Disposition: A | Discharge status: Home / Self Care | Payer: Medicaid Other | Source: Ambulatory Visit | Attending: Nephrology | Admitting: Nephrology

## 2017-07-16 VITALS — BP 103/74 | HR 71 | Temp 99.1°F | Resp 18

## 2017-07-16 DIAGNOSIS — N185 Chronic kidney disease, stage 5: Secondary | ICD-10-CM | POA: Diagnosis not present

## 2017-07-16 DIAGNOSIS — D631 Anemia in chronic kidney disease: Secondary | ICD-10-CM | POA: Insufficient documentation

## 2017-07-16 DIAGNOSIS — Z940 Kidney transplant status: Secondary | ICD-10-CM | POA: Diagnosis not present

## 2017-07-16 LAB — POCT HEMOGLOBIN-HEMACUE: Hemoglobin: 9.6 g/dL — ABNORMAL LOW (ref 12.0–15.0)

## 2017-07-16 LAB — PHOSPHORUS: Phosphorus: 4.1 mg/dL (ref 2.5–4.6)

## 2017-07-16 MED ORDER — EPOETIN ALFA 40000 UNIT/ML IJ SOLN
INTRAMUSCULAR | Status: AC
  Administered 2017-07-16: 40000 [IU] via SUBCUTANEOUS
  Filled 2017-07-16: qty 1

## 2017-07-16 MED ORDER — EPOETIN ALFA 20000 UNIT/ML IJ SOLN: 40000.0000 [IU] | INTRAMUSCULAR | Status: DC

## 2017-07-18 ENCOUNTER — Emergency Department (HOSPITAL_COMMUNITY)
Admission: EM | Admit: 2017-07-18 | Discharge: 2017-07-18 | Disposition: A | Discharge status: Home / Self Care | Payer: Medicaid Other | Attending: Emergency Medicine | Admitting: Emergency Medicine

## 2017-07-18 ENCOUNTER — Encounter (HOSPITAL_COMMUNITY): Payer: Self-pay | Admitting: Emergency Medicine

## 2017-07-18 ENCOUNTER — Emergency Department (HOSPITAL_COMMUNITY): Payer: Medicaid Other

## 2017-07-18 DIAGNOSIS — R05 Cough: Secondary | ICD-10-CM | POA: Diagnosis present

## 2017-07-18 DIAGNOSIS — Z7982 Long term (current) use of aspirin: Secondary | ICD-10-CM | POA: Insufficient documentation

## 2017-07-18 DIAGNOSIS — R070 Pain in throat: Secondary | ICD-10-CM | POA: Insufficient documentation

## 2017-07-18 DIAGNOSIS — Z8673 Personal history of transient ischemic attack (TIA), and cerebral infarction without residual deficits: Secondary | ICD-10-CM | POA: Diagnosis not present

## 2017-07-18 DIAGNOSIS — F1210 Cannabis abuse, uncomplicated: Secondary | ICD-10-CM | POA: Insufficient documentation

## 2017-07-18 DIAGNOSIS — J029 Acute pharyngitis, unspecified: Secondary | ICD-10-CM

## 2017-07-18 DIAGNOSIS — Z79899 Other long term (current) drug therapy: Secondary | ICD-10-CM | POA: Insufficient documentation

## 2017-07-18 DIAGNOSIS — F17210 Nicotine dependence, cigarettes, uncomplicated: Secondary | ICD-10-CM | POA: Diagnosis not present

## 2017-07-18 DIAGNOSIS — R059 Cough, unspecified: Secondary | ICD-10-CM

## 2017-07-18 DIAGNOSIS — R0981 Nasal congestion: Secondary | ICD-10-CM | POA: Insufficient documentation

## 2017-07-18 DIAGNOSIS — R51 Headache: Secondary | ICD-10-CM | POA: Diagnosis not present

## 2017-07-18 DIAGNOSIS — J019 Acute sinusitis, unspecified: Secondary | ICD-10-CM | POA: Diagnosis not present

## 2017-07-18 LAB — CBC WITH DIFFERENTIAL/PLATELET
Basophils Absolute: 0 10*3/uL (ref 0.0–0.1)
Basophils Relative: 0 %
Eosinophils Absolute: 0.1 10*3/uL (ref 0.0–0.7)
Eosinophils Relative: 1 %
HCT: 30.1 % — ABNORMAL LOW (ref 36.0–46.0)
Hemoglobin: 9.1 g/dL — ABNORMAL LOW (ref 12.0–15.0)
Lymphocytes Relative: 13 %
Lymphs Abs: 0.6 10*3/uL — ABNORMAL LOW (ref 0.7–4.0)
MCH: 30.5 pg (ref 26.0–34.0)
MCHC: 30.2 g/dL (ref 30.0–36.0)
MCV: 101 fL — ABNORMAL HIGH (ref 78.0–100.0)
Monocytes Absolute: 0.4 10*3/uL (ref 0.1–1.0)
Monocytes Relative: 8 %
Neutro Abs: 3.8 10*3/uL (ref 1.7–7.7)
Neutrophils Relative %: 78 %
Platelets: 224 10*3/uL (ref 150–400)
RBC: 2.98 MIL/uL — ABNORMAL LOW (ref 3.87–5.11)
RDW: 22.5 % — ABNORMAL HIGH (ref 11.5–15.5)
WBC: 4.9 10*3/uL (ref 4.0–10.5)

## 2017-07-18 LAB — COMPREHENSIVE METABOLIC PANEL
ALT: 8 U/L — ABNORMAL LOW (ref 14–54)
AST: 15 U/L (ref 15–41)
Albumin: 3.9 g/dL (ref 3.5–5.0)
Alkaline Phosphatase: 123 U/L (ref 38–126)
Anion gap: 11 (ref 5–15)
BUN: 24 mg/dL — ABNORMAL HIGH (ref 6–20)
CO2: 20 mmol/L — ABNORMAL LOW (ref 22–32)
Calcium: 9.2 mg/dL (ref 8.9–10.3)
Chloride: 110 mmol/L (ref 101–111)
Creatinine, Ser: 2.09 mg/dL — ABNORMAL HIGH (ref 0.44–1.00)
GFR calc Af Amer: 31 mL/min — ABNORMAL LOW (ref >60)
GFR calc non Af Amer: 27 mL/min — ABNORMAL LOW (ref >60)
Glucose, Bld: 118 mg/dL — ABNORMAL HIGH (ref 65–99)
Potassium: 3.9 mmol/L (ref 3.5–5.1)
Sodium: 141 mmol/L (ref 135–145)
Total Bilirubin: 0.5 mg/dL (ref 0.3–1.2)
Total Protein: 6.6 g/dL (ref 6.5–8.1)

## 2017-07-18 LAB — TACROLIMUS LEVEL: Tacrolimus (FK506) - LabCorp: 12.4 ng/mL (ref 2.0–20.0)

## 2017-07-18 MED ORDER — FLUTICASONE PROPIONATE 50 MCG/ACT NA SUSP: 1.0000 | Freq: Every day | NASAL | 0 refills | Status: DC

## 2017-07-18 NOTE — Discharge Instructions (Signed)
Continue taking your home medications as prescribed. Use Flonase twice a day to help with nasal congestion and sinus pressure. Follow-up with your primary care doctor for further evaluation of your symptoms. Follow up with the ear nose and throat doctor for further management. Return to the emergency room if you develop high fevers, difficulty breathing, or any new or concerning symptoms.

## 2017-07-18 NOTE — ED Provider Notes (Signed)
[facility] EMERGENCY DEPARTMENT Provider Note   CSN: [id number] Arrival date & time: 07/18/17  1609     History   Chief Complaint Chief Complaint  Patient presents with  . Cough  . Headache  . Sore Throat    HPI Tina Watkins is a 52 y.o. female presenting for evaluation of several months of URI symptoms.  Patient states for the past 2 months, she has had intermittent URI symptoms.  Symptoms will come and go, but are never completely gone.  Currently she reports nasal congestion, sore throat, productive cough, and right-sided sinus pressure.  She was recently treated with antibiotics, finished 3 days ago.  She does not remember the name of the antibiotics she was on.  She has a history of kidney transplant, is on immunosuppression.  She denies fevers at home.  Her cough is productive with yellow sputum.  She has been taking Hall's without improvement.  She denies vision changes, difficulty breathing, chest pain, nausea, vomiting, abdominal pain.  No one else at home is sick.  She smokes cigarettes daily.  Denies history of asthma or COPD.  She has been using an inhaler that was given to her several months ago daily without improvement of symptoms.  HPI  Past Medical History:  Diagnosis Date  . Bacteremia due to Gram-negative bacteria 05/23/2011  . Blind right eye   . Chronic lower back pain   . Complication of anesthesia    "woke up during OR; I have an extremely high tolerance" (12/11/2011) 1 procedure was graft; the other procedures were procedures that are typically done with sedation.  . DDD (degenerative disc disease), cervical   . Depression   . Dysrhythmia    "tachycardia" (12/11/2011) new onset afib 10/15/14 EKG  . E coli bacteremia 06/18/2011  . ESRD (end stage renal disease) ([place]) 06/12/2011  . Fibromyalgia   . Gastroparesis   . Gastroparesis   . GERD (gastroesophageal reflux disease)   . Glaucoma    right eye  . Gout   . H/O carpal tunnel  syndrome   . Headache(784.0)    "not often anymore" (12/11/2011)  . Herpes genitalia 1994  . History of blood transfusion    "more than a few times" (12/11/2011)  . History of stomach ulcers   . Hypotension   . Iron deficiency anemia   . New onset a-fib ([place])    10/15/14 EKG  . Osteopenia   . Seizures ([place]) 1994   "post transplant; only have had that one" (12/11/2011)  . Spinal stenosis in cervical region   . Stroke [facility])     left basal ganglia lacunar infarct; Right frontal lobe lacunar infarct.  . Stroke [facility]) ~ 1999; 2001   "briefly lost my vision; lost my right eye" (12/11/2011)    Patient Active Problem List   Diagnosis Date Noted  . Chest pain 09/29/2015  . Cervical stenosis of spinal canal 01/08/2015  . Urinary tract infectious disease   . Muscle spasms of neck 06/15/2014  . Bleeding hemorrhoid 06/15/2014  . Anemia in chronic kidney disease 06/15/2014  . Acute on chronic renal failure ([place]) 06/13/2014  . Pyelonephritis, acute 06/13/2014  . Sepsis ([place]) 06/13/2014  . History of renal transplant   . Chronic pain disorder 04/09/2012  . Dehydration, mild 04/09/2012  . Gout attack 06/23/2011  . Herpes infection 06/23/2011  . Anxiety 06/23/2011  . E coli bacteremia 06/18/2011  . ESRD (end stage renal disease) ([place]) 06/12/2011  . UTI (lower  urinary tract infection) 06/12/2011  . Bacteremia due to Gram-negative bacteria 05/23/2011  . History of renal transplantation 05/22/2011  . Septic shock(785.52) 05/22/2011  . Acute on chronic kidney failure ([place]) 05/22/2011  . Gastroparesis 04/24/2008  . WEIGHT LOSS 08/24/2007  . NAUSEA AND VOMITING 08/24/2007    Past Surgical History:  Procedure Laterality Date  . ANTERIOR CERVICAL DECOMP/DISCECTOMY FUSION N/A 01/08/2015   Procedure: Anterior Cervical Three-Four/Four-Five Decompression/Diskectomy/Fusion;  Surgeon: Leeroy Cha, MD;  Location: [facility];  Service: Neurosurgery;  Laterality: N/A;  C3-4 C4-5 Anterior cervical  decompression/diskectomy/fusion  . APPENDECTOMY  ~ 2004  . CATARACT EXTRACTION     right eye  . COLONOSCOPY    . ENUCLEATION  2001   "right"  . ESOPHAGOGASTRODUODENOSCOPY (EGD) WITH PROPOFOL N/A 04/21/2012   Procedure: ESOPHAGOGASTRODUODENOSCOPY (EGD) WITH PROPOFOL;  Surgeon: Milus Banister, MD;  Location: [facility] ENDOSCOPY;  Service: Endoscopy;  Laterality: N/A;  . 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 [place]; 1999; 2005   "right"  . MULTIPLE TOOTH EXTRACTIONS    . TONSILLECTOMY    . TOTAL NEPHRECTOMY  1988?; 1994; 2005     OB History   None      Home Medications    Prior to Admission medications   Medication Sig Start Date End Date Taking? Authorizing Provider  ALPRAZolam (XANAX) 0.25 MG tablet Take 0.25 mg by mouth daily as needed (FOR SVT).    [provider]  aspirin EC 81 MG tablet Take 81 mg by mouth daily.     [provider]  azaTHIOprine (IMURAN) 50 MG tablet Take 100 mg by mouth daily.     [provider]  azithromycin (ZITHROMAX) 250 MG tablet Take 1 tablet (250 mg total) by mouth daily. Take first 2 tablets together, then 1 every day until finished. 06/05/17   Ward, Delice Bison, DO  fluticasone (FLONASE) 50 MCG/ACT nasal spray Place 1 spray into both nostrils daily. 07/18/17   Fernie Grimm, PA-C  furosemide (LASIX) 40 MG tablet Take 40 mg by mouth daily. 05/09/17   [provider]  latanoprost (XALATAN) 0.005 % ophthalmic solution Place 1 drop into the left eye at bedtime. 03/30/17   [provider]  metoCLOPramide (REGLAN) 5 MG tablet TAKE 1 TABLET BY MOUTH AT BEDTIME AND MAY REPEAT DOSE ONE TIME IF NEEDED Patient not taking: Reported on 06/05/2017 10/20/16   Milus Banister, MD  metoCLOPramide (REGLAN) 5 MG tablet TAKE 1 TABLET BY MOUTH TWICE A DAY AT 8AM AND 10 PM 06/22/17   Milus Banister, MD  omeprazole (PRILOSEC) 40 MG capsule Take 40 mg by  mouth 2 (two) times daily.     [provider]  ondansetron (ZOFRAN ODT) 4 MG disintegrating tablet Take 1 tablet (4 mg total) by mouth every 6 (six) hours as needed. 06/05/17   Ward, Delice Bison, DO  oxyCODONE-acetaminophen (PERCOCET) 10-325 MG tablet Take 1 tablet by mouth every 6 (six) hours as needed for pain.    [provider]  predniSONE (DELTASONE) 5 MG tablet Take 5 mg by mouth daily. 06/22/11   [name], MD  promethazine (PHENERGAN) 25 MG tablet Take 1 tablet (25 mg total) by mouth every 6 (six) hours as needed for nausea or vomiting. Patient not taking: Reported on 06/05/2017 06/18/15   Antonietta Breach, PA-C  tacrolimus (PROGRAF) 1 MG capsule Take 3 mg by mouth  2 (two) times daily.  06/22/11   [name], MD  zolpidem (AMBIEN) 10 MG tablet Take 10 mg by mouth at bedtime as needed for sleep.    [provider]    Family History Family History  Problem Relation Age of Onset  . Glaucoma Mother   . Multiple sclerosis Brother   . Hypertension Maternal Grandmother   . Breast cancer Neg Hx     Social History Social History   Tobacco Use  . Smoking status: Current Every Day Smoker    Packs/day: 0.20    Years: 28.00    Pack years: 5.60    Types: Cigarettes  . Smokeless tobacco: Never Used  . Tobacco comment: had quit 03/05/2011  Substance Use Topics  . Alcohol use: Yes    Comment: rare  . Drug use: Yes    Types: Marijuana     Allergies   Levofloxacin   Review of Systems Review of Systems  Constitutional: Negative for fever.  HENT: Positive for congestion, sinus pressure, sinus pain and sore throat. Negative for trouble swallowing and voice change.   Respiratory: Positive for cough.   All other systems reviewed and are negative.    Physical Exam Updated Vital Signs BP 97/76   Pulse 77   Temp 98.6 F (37 C)   Resp 18   SpO2 100%   Physical Exam  Constitutional: She is oriented to person, place, and time. She appears  well-developed and well-nourished. No distress.  Appears in no distress.  HENT:  Head: Normocephalic and atraumatic.  Right Ear: Tympanic membrane, external ear and ear canal normal.  Left Ear: Tympanic membrane, external ear and ear canal normal.  Nose: Mucosal edema present. Right sinus exhibits maxillary sinus tenderness. Left sinus exhibits maxillary sinus tenderness.  Mouth/Throat: Uvula is midline, oropharynx is clear and moist and mucous membranes are normal. No tonsillar exudate.  Nasal mucosal edema.  Tenderness to palpation of maxillary and frontal sinus on the left side.  OP mildly erythematous without tonsillar swelling or exudate.  Uvula midline with equal palate rise.  TMs nonerythematous nonbulging bilaterally.  No trismus.  No muffled voice.  Handling secretions easily.  Eyes: Conjunctivae and EOM are normal.  Blind in right eye, no obvious swelling of the right periorbital tissue.  Neck: Normal range of motion.  Cardiovascular: Normal rate, regular rhythm and intact distal pulses.  Pulmonary/Chest: Effort normal and breath sounds normal. No respiratory distress. She has no decreased breath sounds. She has no wheezes. She has no rhonchi. She has no rales.  Pt speaking in full sentences.  Clear lung sounds in all fields  Abdominal: Soft. She exhibits no distension. There is no tenderness.  Musculoskeletal: Normal range of motion.  Lymphadenopathy:    She has cervical adenopathy.  Neurological: She is alert and oriented to person, place, and time.  Skin: Skin is warm. Capillary refill takes less than 2 seconds. No rash noted.  Psychiatric: She has a normal mood and affect.  Nursing note and vitals reviewed.    ED Treatments / Results  Labs (all labs ordered are listed, but only abnormal results are displayed) Labs Reviewed  CBC WITH DIFFERENTIAL/PLATELET - Abnormal; Notable for the following components:      Result Value   RBC 2.98 (*)    Hemoglobin 9.1 (*)    HCT 30.1  (*)    MCV 101.0 (*)    RDW 22.5 (*)    Lymphs Abs 0.6 (*)    All  other components within normal limits  COMPREHENSIVE METABOLIC PANEL - Abnormal; Notable for the following components:   CO2 20 (*)    Glucose, Bld 118 (*)    BUN 24 (*)    Creatinine, Ser 2.09 (*)    ALT 8 (*)    GFR calc non Af Amer 27 (*)    GFR calc Af Amer 31 (*)    All other components within normal limits    EKG None  Radiology Dg Chest 2 View  Result Date: 07/18/2017 CLINICAL DATA:  Cough and congestion. EXAM: CHEST - 2 VIEW COMPARISON:  06/05/2017 FINDINGS: Mild cardiac enlargement. A stent is identified within the superior vena cava. No pleural effusion or edema identified. No airspace opacities identified. IMPRESSION: 1. No acute cardiopulmonary abnormalities. Electronically Signed   By: Kerby Moors M.D.   On: 07/18/2017 16:56    Procedures Procedures (including critical care time)  Medications Ordered in ED Medications - No data to display   Initial Impression / Assessment and Plan / ED Course  I have reviewed the triage vital signs and the nursing notes.  Pertinent labs & imaging results that were available during my care of the patient were reviewed by me and considered in my medical decision making (see chart for details).     Patient presenting with 2 months of intermittent URI symptoms.  Physical exam reassuring, patient is afebrile and appears nontoxic.  Pulmonary exam reassuring.  Doubt pneumonia, strep, other bacterial infection, or peritonsillar abscess.  Chest x-ray viewed and interpreted by me, no pneumonia, pneumothorax, or effusions.  Likely sinusitis.  Will treat symptomatically.  We will not give in a box at this time, as she recently finished course of antibiotics and is not having fevers.  Labs reassuring without leukocytosis.  Creatinine baseline.  Case discussed with attending, Dr. Melina Copa agrees to plan. Patient to follow-up with primary care and ENT for further evaluation and  management.  At this time, patient appears safe for discharge.  Return precautions given.  Patient states she understands and agrees to plan.   Final Clinical Impressions(s) / ED Diagnoses   Final diagnoses:  Acute sinusitis, recurrence not specified, unspecified location  Cough  Sore throat    ED Discharge Orders        Ordered    fluticasone (FLONASE) 50 MCG/ACT nasal spray  Daily     07/18/17 [place], Warwick Nick, PA-C 07/18/17 1820    Hayden Rasmussen, MD 07/19/17 1141

## 2017-07-18 NOTE — ED Triage Notes (Addendum)
Pt has had a productive cough for 2 months with clear/yellow mucous, had double ear infections and has had a sore throat for 1 week. Pt states she had a negative strep test last week. States she still feels sick and still has the cough. Pt is a kidney transplant pt. Pt denies SI but endorses depression and states "I am tired of being in pain but I wouldn't do anything because I want to watch my family grow up." Afebrile. Hr 77. Pt is hypotensive in 90s but states this is her baseline, MAP is 83.

## 2017-07-20 LAB — CMV DNA BY PCR, QUALITATIVE: CMV DNA, Qual PCR: NEGATIVE

## 2017-07-21 ENCOUNTER — Other Ambulatory Visit: Payer: Self-pay | Admitting: Gastroenterology

## 2017-07-21 DIAGNOSIS — J0141 Acute recurrent pansinusitis: Secondary | ICD-10-CM | POA: Insufficient documentation

## 2017-07-30 ENCOUNTER — Encounter (HOSPITAL_COMMUNITY): Payer: Medicaid Other

## 2017-08-03 DIAGNOSIS — H6983 Other specified disorders of Eustachian tube, bilateral: Secondary | ICD-10-CM | POA: Insufficient documentation

## 2017-08-03 DIAGNOSIS — J029 Acute pharyngitis, unspecified: Secondary | ICD-10-CM | POA: Diagnosis not present

## 2017-08-03 DIAGNOSIS — H6993 Unspecified Eustachian tube disorder, bilateral: Secondary | ICD-10-CM | POA: Insufficient documentation

## 2017-08-03 DIAGNOSIS — B370 Candidal stomatitis: Secondary | ICD-10-CM | POA: Diagnosis not present

## 2017-08-04 DIAGNOSIS — Z79899 Other long term (current) drug therapy: Secondary | ICD-10-CM | POA: Diagnosis not present

## 2017-08-06 ENCOUNTER — Other Ambulatory Visit: Payer: Self-pay

## 2017-08-06 ENCOUNTER — Ambulatory Visit (HOSPITAL_COMMUNITY)
Admission: RE | Admit: 2017-08-06 | Discharge: 2017-08-06 | Disposition: A | Discharge status: Home / Self Care | Payer: Medicaid Other | Source: Ambulatory Visit | Attending: Nephrology | Admitting: Nephrology

## 2017-08-06 VITALS — BP 113/69 | HR 76 | Temp 99.2°F | Ht 59.0 in | Wt 111.0 lb

## 2017-08-06 DIAGNOSIS — N185 Chronic kidney disease, stage 5: Secondary | ICD-10-CM | POA: Insufficient documentation

## 2017-08-06 DIAGNOSIS — D631 Anemia in chronic kidney disease: Secondary | ICD-10-CM | POA: Insufficient documentation

## 2017-08-06 DIAGNOSIS — Z940 Kidney transplant status: Secondary | ICD-10-CM

## 2017-08-06 LAB — COMPREHENSIVE METABOLIC PANEL
ALT: 8 U/L (ref 0–44)
AST: 12 U/L — ABNORMAL LOW (ref 15–41)
Albumin: 3.7 g/dL (ref 3.5–5.0)
Alkaline Phosphatase: 118 U/L (ref 38–126)
Anion gap: 11 (ref 5–15)
BUN: 19 mg/dL (ref 6–20)
CO2: 20 mmol/L — ABNORMAL LOW (ref 22–32)
Calcium: 9.2 mg/dL (ref 8.9–10.3)
Chloride: 108 mmol/L (ref 98–111)
Creatinine, Ser: 2.22 mg/dL — ABNORMAL HIGH (ref 0.44–1.00)
GFR calc Af Amer: 29 mL/min — ABNORMAL LOW (ref >60)
GFR calc non Af Amer: 25 mL/min — ABNORMAL LOW (ref >60)
Glucose, Bld: 103 mg/dL — ABNORMAL HIGH (ref 70–99)
Potassium: 4.7 mmol/L (ref 3.5–5.1)
Sodium: 139 mmol/L (ref 135–145)
Total Bilirubin: 0.9 mg/dL (ref 0.3–1.2)
Total Protein: 6.9 g/dL (ref 6.5–8.1)

## 2017-08-06 LAB — PHOSPHORUS: Phosphorus: 3.7 mg/dL (ref 2.5–4.6)

## 2017-08-06 LAB — POCT HEMOGLOBIN-HEMACUE: Hemoglobin: 9.6 g/dL — ABNORMAL LOW (ref 12.0–15.0)

## 2017-08-06 MED ORDER — METOCLOPRAMIDE HCL 5 MG PO TABS: ORAL_TABLET | ORAL | 3 refills | Status: DC

## 2017-08-06 MED ORDER — EPOETIN ALFA 40000 UNIT/ML IJ SOLN
INTRAMUSCULAR | Status: AC
  Administered 2017-08-06: 40000 [IU]
  Filled 2017-08-06: qty 1

## 2017-08-06 MED ORDER — EPOETIN ALFA 20000 UNIT/ML IJ SOLN: 40000.0000 [IU] | INTRAMUSCULAR | Status: DC

## 2017-08-07 LAB — BK QUANT PCR (PLASMA/SERUM)
BK Quantitaion PCR: NEGATIVE copies/mL
Log10 BK Qn PCR: UNDETERMINED log10copy/mL

## 2017-08-09 ENCOUNTER — Emergency Department (HOSPITAL_COMMUNITY): Payer: Medicaid Other

## 2017-08-09 ENCOUNTER — Other Ambulatory Visit: Payer: Self-pay

## 2017-08-09 ENCOUNTER — Encounter (HOSPITAL_COMMUNITY): Payer: Self-pay

## 2017-08-09 ENCOUNTER — Inpatient Hospital Stay (HOSPITAL_COMMUNITY)
Admission: EM | Admit: 2017-08-09 | Discharge: 2017-08-17 | DRG: 193 | Disposition: A | Discharge status: Home / Self Care | Payer: Medicaid Other | Attending: Family Medicine | Admitting: Family Medicine

## 2017-08-09 DIAGNOSIS — M109 Gout, unspecified: Secondary | ICD-10-CM | POA: Diagnosis present

## 2017-08-09 DIAGNOSIS — M8580 Other specified disorders of bone density and structure, unspecified site: Secondary | ICD-10-CM | POA: Diagnosis present

## 2017-08-09 DIAGNOSIS — N039 Chronic nephritic syndrome with unspecified morphologic changes: Secondary | ICD-10-CM | POA: Diagnosis present

## 2017-08-09 DIAGNOSIS — F17210 Nicotine dependence, cigarettes, uncomplicated: Secondary | ICD-10-CM | POA: Diagnosis present

## 2017-08-09 DIAGNOSIS — H5461 Unqualified visual loss, right eye, normal vision left eye: Secondary | ICD-10-CM | POA: Diagnosis present

## 2017-08-09 DIAGNOSIS — M4802 Spinal stenosis, cervical region: Secondary | ICD-10-CM | POA: Diagnosis not present

## 2017-08-09 DIAGNOSIS — Z7982 Long term (current) use of aspirin: Secondary | ICD-10-CM

## 2017-08-09 DIAGNOSIS — G894 Chronic pain syndrome: Secondary | ICD-10-CM | POA: Diagnosis not present

## 2017-08-09 DIAGNOSIS — I9589 Other hypotension: Secondary | ICD-10-CM | POA: Diagnosis not present

## 2017-08-09 DIAGNOSIS — J189 Pneumonia, unspecified organism: Principal | ICD-10-CM | POA: Diagnosis present

## 2017-08-09 DIAGNOSIS — N179 Acute kidney failure, unspecified: Secondary | ICD-10-CM | POA: Diagnosis present

## 2017-08-09 DIAGNOSIS — R0789 Other chest pain: Secondary | ICD-10-CM | POA: Diagnosis present

## 2017-08-09 DIAGNOSIS — D631 Anemia in chronic kidney disease: Secondary | ICD-10-CM | POA: Diagnosis not present

## 2017-08-09 DIAGNOSIS — H409 Unspecified glaucoma: Secondary | ICD-10-CM | POA: Diagnosis present

## 2017-08-09 DIAGNOSIS — M1000 Idiopathic gout, unspecified site: Secondary | ICD-10-CM | POA: Diagnosis not present

## 2017-08-09 DIAGNOSIS — B370 Candidal stomatitis: Secondary | ICD-10-CM | POA: Diagnosis not present

## 2017-08-09 DIAGNOSIS — R0602 Shortness of breath: Secondary | ICD-10-CM | POA: Diagnosis not present

## 2017-08-09 DIAGNOSIS — R0902 Hypoxemia: Secondary | ICD-10-CM | POA: Diagnosis not present

## 2017-08-09 DIAGNOSIS — T368X5A Adverse effect of other systemic antibiotics, initial encounter: Secondary | ICD-10-CM | POA: Diagnosis present

## 2017-08-09 DIAGNOSIS — N189 Chronic kidney disease, unspecified: Secondary | ICD-10-CM | POA: Diagnosis not present

## 2017-08-09 DIAGNOSIS — N186 End stage renal disease: Secondary | ICD-10-CM | POA: Diagnosis not present

## 2017-08-09 DIAGNOSIS — R102 Pelvic and perineal pain: Secondary | ICD-10-CM | POA: Diagnosis not present

## 2017-08-09 DIAGNOSIS — J440 Chronic obstructive pulmonary disease with acute lower respiratory infection: Secondary | ICD-10-CM | POA: Diagnosis not present

## 2017-08-09 DIAGNOSIS — K3184 Gastroparesis: Secondary | ICD-10-CM

## 2017-08-09 DIAGNOSIS — Z940 Kidney transplant status: Secondary | ICD-10-CM | POA: Diagnosis not present

## 2017-08-09 DIAGNOSIS — Z881 Allergy status to other antibiotic agents status: Secondary | ICD-10-CM

## 2017-08-09 DIAGNOSIS — M797 Fibromyalgia: Secondary | ICD-10-CM | POA: Diagnosis present

## 2017-08-09 DIAGNOSIS — I959 Hypotension, unspecified: Secondary | ICD-10-CM | POA: Diagnosis not present

## 2017-08-09 DIAGNOSIS — M1A3720 Chronic gout due to renal impairment, left ankle and foot, without tophus (tophi): Secondary | ICD-10-CM

## 2017-08-09 DIAGNOSIS — Z8673 Personal history of transient ischemic attack (TIA), and cerebral infarction without residual deficits: Secondary | ICD-10-CM

## 2017-08-09 DIAGNOSIS — K219 Gastro-esophageal reflux disease without esophagitis: Secondary | ICD-10-CM | POA: Diagnosis present

## 2017-08-09 DIAGNOSIS — R05 Cough: Secondary | ICD-10-CM | POA: Diagnosis not present

## 2017-08-09 DIAGNOSIS — Z905 Acquired absence of kidney: Secondary | ICD-10-CM

## 2017-08-09 DIAGNOSIS — F419 Anxiety disorder, unspecified: Secondary | ICD-10-CM | POA: Diagnosis present

## 2017-08-09 DIAGNOSIS — Z7952 Long term (current) use of systemic steroids: Secondary | ICD-10-CM

## 2017-08-09 DIAGNOSIS — F329 Major depressive disorder, single episode, unspecified: Secondary | ICD-10-CM | POA: Diagnosis present

## 2017-08-09 DIAGNOSIS — R079 Chest pain, unspecified: Secondary | ICD-10-CM | POA: Diagnosis not present

## 2017-08-09 DIAGNOSIS — Z79899 Other long term (current) drug therapy: Secondary | ICD-10-CM

## 2017-08-09 DIAGNOSIS — J181 Lobar pneumonia, unspecified organism: Secondary | ICD-10-CM | POA: Diagnosis not present

## 2017-08-09 DIAGNOSIS — R109 Unspecified abdominal pain: Secondary | ICD-10-CM | POA: Diagnosis not present

## 2017-08-09 LAB — COMPREHENSIVE METABOLIC PANEL
ALT: 8 U/L (ref 0–44)
AST: 12 U/L — ABNORMAL LOW (ref 15–41)
Albumin: 3.8 g/dL (ref 3.5–5.0)
Alkaline Phosphatase: 123 U/L (ref 38–126)
Anion gap: 12 (ref 5–15)
BUN: 25 mg/dL — ABNORMAL HIGH (ref 6–20)
CO2: 23 mmol/L (ref 22–32)
Calcium: 9.5 mg/dL (ref 8.9–10.3)
Chloride: 102 mmol/L (ref 98–111)
Creatinine, Ser: 2.95 mg/dL — ABNORMAL HIGH (ref 0.44–1.00)
GFR calc Af Amer: 20 mL/min — ABNORMAL LOW (ref >60)
GFR calc non Af Amer: 18 mL/min — ABNORMAL LOW (ref >60)
Glucose, Bld: 95 mg/dL (ref 70–99)
Potassium: 4.3 mmol/L (ref 3.5–5.1)
Sodium: 137 mmol/L (ref 135–145)
Total Bilirubin: 0.7 mg/dL (ref 0.3–1.2)
Total Protein: 7.2 g/dL (ref 6.5–8.1)

## 2017-08-09 LAB — BASIC METABOLIC PANEL
Anion gap: 12 (ref 5–15)
BUN: 25 mg/dL — ABNORMAL HIGH (ref 6–20)
CO2: 21 mmol/L — ABNORMAL LOW (ref 22–32)
Calcium: 9.2 mg/dL (ref 8.9–10.3)
Chloride: 103 mmol/L (ref 98–111)
Creatinine, Ser: 2.85 mg/dL — ABNORMAL HIGH (ref 0.44–1.00)
GFR calc Af Amer: 21 mL/min — ABNORMAL LOW (ref >60)
GFR calc non Af Amer: 18 mL/min — ABNORMAL LOW (ref >60)
Glucose, Bld: 95 mg/dL (ref 70–99)
Potassium: 4.4 mmol/L (ref 3.5–5.1)
Sodium: 136 mmol/L (ref 135–145)

## 2017-08-09 LAB — I-STAT BETA HCG BLOOD, ED (MC, WL, AP ONLY): I-stat hCG, quantitative: <5 m[IU]/mL (ref <5)

## 2017-08-09 LAB — CBC
HCT: 26.2 % — ABNORMAL LOW (ref 36.0–46.0)
Hemoglobin: 8.4 g/dL — ABNORMAL LOW (ref 12.0–15.0)
MCH: 31 pg (ref 26.0–34.0)
MCHC: 32.1 g/dL (ref 30.0–36.0)
MCV: 96.7 fL (ref 78.0–100.0)
Platelets: 240 10*3/uL (ref 150–400)
RBC: 2.71 MIL/uL — ABNORMAL LOW (ref 3.87–5.11)
RDW: 21.4 % — ABNORMAL HIGH (ref 11.5–15.5)
WBC: 6.6 10*3/uL (ref 4.0–10.5)

## 2017-08-09 LAB — URINALYSIS, ROUTINE W REFLEX MICROSCOPIC
Bilirubin Urine: NEGATIVE
Glucose, UA: NEGATIVE mg/dL
Hgb urine dipstick: NEGATIVE
Ketones, ur: NEGATIVE mg/dL
Leukocytes, UA: NEGATIVE
Nitrite: NEGATIVE
Protein, ur: NEGATIVE mg/dL
Specific Gravity, Urine: 1.009 (ref 1.005–1.030)
pH: 5 (ref 5.0–8.0)

## 2017-08-09 LAB — I-STAT CG4 LACTIC ACID, ED
Lactic Acid, Venous: 1.92 mmol/L — ABNORMAL HIGH (ref 0.5–1.9)
Lactic Acid, Venous: 1.01 mmol/L (ref 0.5–1.9)

## 2017-08-09 LAB — I-STAT TROPONIN, ED: Troponin i, poc: 0.01 ng/mL (ref 0.00–0.08)

## 2017-08-09 MED ORDER — SODIUM CHLORIDE 0.9 % IV SOLN
500.0000 mg | INTRAVENOUS | Status: DC
  Filled 2017-08-09: qty 500

## 2017-08-09 MED ORDER — SODIUM CHLORIDE 0.9 % IV SOLN
1.0000 g | INTRAVENOUS | Status: DC
  Administered 2017-08-10: 1 g via INTRAVENOUS
  Filled 2017-08-09 (×2): qty 10

## 2017-08-09 MED ORDER — LATANOPROST 0.005 % OP SOLN
1.0000 [drp] | Freq: Every day | OPHTHALMIC | Status: DC
  Administered 2017-08-10 – 2017-08-16 (×8): 1 [drp] via OPHTHALMIC
  Filled 2017-08-09: qty 2.5

## 2017-08-09 MED ORDER — OXYCODONE HCL 5 MG PO TABS
5.0000 mg | ORAL_TABLET | Freq: Four times a day (QID) | ORAL | Status: DC | PRN
  Administered 2017-08-09 – 2017-08-10 (×2): 5 mg via ORAL
  Filled 2017-08-09 (×3): qty 1

## 2017-08-09 MED ORDER — ASPIRIN EC 81 MG PO TBEC
81.0000 mg | DELAYED_RELEASE_TABLET | Freq: Every day | ORAL | Status: DC
  Administered 2017-08-10 – 2017-08-17 (×8): 81 mg via ORAL
  Filled 2017-08-09 (×8): qty 1

## 2017-08-09 MED ORDER — VANCOMYCIN HCL IN DEXTROSE 1-5 GM/200ML-% IV SOLN
1000.0000 mg | Freq: Once | INTRAVENOUS | Status: AC
  Administered 2017-08-09: 1000 mg via INTRAVENOUS
  Filled 2017-08-09: qty 200

## 2017-08-09 MED ORDER — ALBUTEROL SULFATE (2.5 MG/3ML) 0.083% IN NEBU
3.0000 mL | INHALATION_SOLUTION | Freq: Four times a day (QID) | RESPIRATORY_TRACT | Status: DC | PRN
  Administered 2017-08-11 – 2017-08-13 (×3): 3 mL via RESPIRATORY_TRACT
  Filled 2017-08-09 (×3): qty 3

## 2017-08-09 MED ORDER — METOCLOPRAMIDE HCL 5 MG PO TABS
5.0000 mg | ORAL_TABLET | Freq: Two times a day (BID) | ORAL | Status: DC
  Administered 2017-08-09 – 2017-08-17 (×16): 5 mg via ORAL
  Filled 2017-08-09 (×16): qty 1

## 2017-08-09 MED ORDER — ACETAMINOPHEN 325 MG PO TABS
650.0000 mg | ORAL_TABLET | Freq: Four times a day (QID) | ORAL | Status: DC | PRN
  Administered 2017-08-12: 650 mg via ORAL
  Filled 2017-08-09: qty 2

## 2017-08-09 MED ORDER — ONDANSETRON 4 MG PO TBDP
4.0000 mg | ORAL_TABLET | Freq: Four times a day (QID) | ORAL | Status: DC | PRN
  Administered 2017-08-10 – 2017-08-15 (×2): 4 mg via ORAL
  Filled 2017-08-09 (×2): qty 1

## 2017-08-09 MED ORDER — PREDNISONE 5 MG PO TABS
5.0000 mg | ORAL_TABLET | Freq: Every day | ORAL | Status: DC
  Administered 2017-08-10 (×2): 5 mg via ORAL
  Filled 2017-08-09 (×2): qty 1

## 2017-08-09 MED ORDER — PANTOPRAZOLE SODIUM 40 MG PO TBEC
40.0000 mg | DELAYED_RELEASE_TABLET | Freq: Every day | ORAL | Status: DC
  Administered 2017-08-10 – 2017-08-17 (×9): 40 mg via ORAL
  Filled 2017-08-09 (×9): qty 1

## 2017-08-09 MED ORDER — FLUTICASONE PROPIONATE 50 MCG/ACT NA SUSP
1.0000 | Freq: Every day | NASAL | Status: DC
  Administered 2017-08-10 – 2017-08-17 (×8): 1 via NASAL
  Filled 2017-08-09 (×2): qty 16

## 2017-08-09 MED ORDER — FUROSEMIDE 40 MG PO TABS
40.0000 mg | ORAL_TABLET | Freq: Every day | ORAL | Status: DC
  Filled 2017-08-09: qty 1

## 2017-08-09 MED ORDER — MORPHINE SULFATE (PF) 4 MG/ML IV SOLN
4.0000 mg | Freq: Once | INTRAVENOUS | Status: AC
  Administered 2017-08-09: 4 mg via INTRAVENOUS
  Filled 2017-08-09: qty 1

## 2017-08-09 MED ORDER — SODIUM CHLORIDE 0.9 % IV BOLUS (SEPSIS)
500.0000 mL | Freq: Once | INTRAVENOUS | Status: AC
  Administered 2017-08-09: 500 mL via INTRAVENOUS

## 2017-08-09 MED ORDER — PIPERACILLIN-TAZOBACTAM 3.375 G IVPB 30 MIN
3.3750 g | Freq: Once | INTRAVENOUS | Status: AC
  Administered 2017-08-09: 3.375 g via INTRAVENOUS
  Filled 2017-08-09: qty 50

## 2017-08-09 MED ORDER — HEPARIN SODIUM (PORCINE) 5000 UNIT/ML IJ SOLN
5000.0000 [IU] | Freq: Three times a day (TID) | INTRAMUSCULAR | Status: DC
  Administered 2017-08-10 – 2017-08-17 (×22): 5000 [IU] via SUBCUTANEOUS
  Filled 2017-08-09 (×23): qty 1

## 2017-08-09 MED ORDER — OXYCODONE-ACETAMINOPHEN 10-325 MG PO TABS: 1.0000 | ORAL_TABLET | Freq: Four times a day (QID) | ORAL | Status: DC | PRN

## 2017-08-09 MED ORDER — SODIUM CHLORIDE 0.9 % IV SOLN
Freq: Once | INTRAVENOUS | Status: AC
  Administered 2017-08-09: 21:00:00 via INTRAVENOUS

## 2017-08-09 MED ORDER — TACROLIMUS 1 MG PO CAPS
3.0000 mg | ORAL_CAPSULE | Freq: Two times a day (BID) | ORAL | Status: DC
  Administered 2017-08-09 – 2017-08-12 (×6): 3 mg via ORAL
  Filled 2017-08-09 (×6): qty 3

## 2017-08-09 MED ORDER — SODIUM CHLORIDE 0.9 % IV SOLN
1.0000 g | INTRAVENOUS | Status: DC
  Filled 2017-08-09: qty 10

## 2017-08-09 MED ORDER — SODIUM CHLORIDE 0.9 % IV BOLUS (SEPSIS)
1000.0000 mL | Freq: Once | INTRAVENOUS | Status: AC
  Administered 2017-08-09: 1000 mL via INTRAVENOUS

## 2017-08-09 MED ORDER — SODIUM CHLORIDE 0.9 % IV BOLUS (SEPSIS)
250.0000 mL | Freq: Once | INTRAVENOUS | Status: AC
  Administered 2017-08-09: 250 mL via INTRAVENOUS

## 2017-08-09 MED ORDER — HALOPERIDOL LACTATE 5 MG/ML IJ SOLN
2.0000 mg | Freq: Once | INTRAMUSCULAR | Status: AC
  Administered 2017-08-09: 2 mg via INTRAVENOUS
  Filled 2017-08-09: qty 1

## 2017-08-09 MED ORDER — OXYCODONE-ACETAMINOPHEN 5-325 MG PO TABS
1.0000 | ORAL_TABLET | Freq: Four times a day (QID) | ORAL | Status: DC | PRN
  Administered 2017-08-09: 1 via ORAL
  Filled 2017-08-09: qty 1

## 2017-08-09 MED ORDER — AZATHIOPRINE 50 MG PO TABS: 100.0000 mg | ORAL_TABLET | Freq: Every day | ORAL | Status: DC

## 2017-08-09 NOTE — ED Notes (Signed)
Patient transported to CT 

## 2017-08-09 NOTE — ED Triage Notes (Signed)
Pt endorses chest pain, shob, back pain, and fever x 1 week. Hypotensive in the 80s in triage. Axox4.

## 2017-08-09 NOTE — ED Provider Notes (Signed)
[place] EMERGENCY DEPARTMENT Provider Note   CSN: [id number] Arrival date & time: 08/09/17  1426     History   Chief Complaint Chief Complaint  Patient presents with  . Chest Pain  . Shortness of Breath  . Fever  . Hypotension    HPI MERELIN HUMAN is a 52 y.o. female.  HPI  This is a 52 year old female with a history of kidney transplant in 2005 on immunosuppression, E. coli bacteremia, gastroparesis, seizure, stroke who presents with 1 week history of fevers.  Reports fevers up to 102.7.  Highest fever was on Saturday.  Patient reports a cough and posttussive nausea.  She does report the cough is productive.  She reports some left-sided flank pain.  It is worse with palpation.  She has not noted any rash.  She denies any vomiting or diarrhea.  She denies any dysuria.  She does report some right lower quadrant pain "over my transplanted kidney."  She rates her pain at 6 out of 10.  She has not taken anything for the pain.  She currently takes Prograf, prednisone, azathioprine.  She is followed by [facility].    Past Medical History:  Diagnosis Date  . Bacteremia due to Gram-negative bacteria 05/23/2011  . Blind right eye   . Chronic lower back pain   . Complication of anesthesia    "woke up during OR; I have an extremely high tolerance" (12/11/2011) 1 procedure was graft; the other procedures were procedures that are typically done with sedation.  . DDD (degenerative disc disease), cervical   . Depression   . Dysrhythmia    "tachycardia" (12/11/2011) new onset afib 10/15/14 EKG  . E coli bacteremia 06/18/2011  . ESRD (end stage renal disease) ([place]) 06/12/2011  . Fibromyalgia   . Gastroparesis   . Gastroparesis   . GERD (gastroesophageal reflux disease)   . Glaucoma    right eye  . Gout   . H/O carpal tunnel syndrome   . Headache(784.0)    "not often anymore" (12/11/2011)  . Herpes genitalia 1994  . History of blood transfusion    "more than a  few times" (12/11/2011)  . History of stomach ulcers   . Hypotension   . Iron deficiency anemia   . New onset a-fib ([place])    10/15/14 EKG  . Osteopenia   . Seizures ([place]) 1994   "post transplant; only have had that one" (12/11/2011)  . Spinal stenosis in cervical region   . Stroke [facility])     left basal ganglia lacunar infarct; Right frontal lobe lacunar infarct.  . Stroke [facility]) ~ 1999; 2001   "briefly lost my vision; lost my right eye" (12/11/2011)    Patient Active Problem List   Diagnosis Date Noted  . Chest pain 09/29/2015  . Cervical stenosis of spinal canal 01/08/2015  . Urinary tract infectious disease   . Muscle spasms of neck 06/15/2014  . Bleeding hemorrhoid 06/15/2014  . Anemia in chronic kidney disease 06/15/2014  . Acute on chronic renal failure ([place]) 06/13/2014  . Pyelonephritis, acute 06/13/2014  . Sepsis ([place]) 06/13/2014  . History of renal transplant   . Chronic pain disorder 04/09/2012  . Dehydration, mild 04/09/2012  . Gout attack 06/23/2011  . Herpes infection 06/23/2011  . Anxiety 06/23/2011  . E coli bacteremia 06/18/2011  . ESRD (end stage renal disease) ([place]) 06/12/2011  . UTI (lower urinary tract infection) 06/12/2011  . Bacteremia due to Gram-negative bacteria 05/23/2011  . History  of renal transplantation 05/22/2011  . Septic shock(785.52) 05/22/2011  . Acute on chronic kidney failure ([place]) 05/22/2011  . Gastroparesis 04/24/2008  . WEIGHT LOSS 08/24/2007  . NAUSEA AND VOMITING 08/24/2007    Past Surgical History:  Procedure Laterality Date  . ANTERIOR CERVICAL DECOMP/DISCECTOMY FUSION N/A 01/08/2015   Procedure: Anterior Cervical Three-Four/Four-Five Decompression/Diskectomy/Fusion;  Surgeon: Leeroy Cha, MD;  Location: [facility];  Service: Neurosurgery;  Laterality: N/A;  C3-4 C4-5 Anterior cervical decompression/diskectomy/fusion  . APPENDECTOMY  ~ 2004  . CATARACT EXTRACTION     right eye  . COLONOSCOPY    . ENUCLEATION  2001    "right"  . ESOPHAGOGASTRODUODENOSCOPY (EGD) WITH PROPOFOL N/A 04/21/2012   Procedure: ESOPHAGOGASTRODUODENOSCOPY (EGD) WITH PROPOFOL;  Surgeon: Milus Banister, MD;  Location: [facility];  Service: Endoscopy;  Laterality: N/A;  . 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 [place]; 1999; 2005   "right"  . MULTIPLE TOOTH EXTRACTIONS    . TONSILLECTOMY    . TOTAL NEPHRECTOMY  1988?; 1994; 2005     OB History   None      Home Medications    Prior to Admission medications   Medication Sig Start Date End Date Taking? Authorizing Provider  ALPRAZolam (XANAX) 0.25 MG tablet Take 0.25 mg by mouth daily as needed (FOR SVT).   Yes [provider]  aspirin EC 81 MG tablet Take 81 mg by mouth daily.    Yes [provider]  azaTHIOprine (IMURAN) 50 MG tablet Take 100 mg by mouth daily.    Yes [provider]  fluticasone (FLONASE) 50 MCG/ACT nasal spray Place 1 spray into both nostrils daily. 07/18/17  Yes Caccavale, Sophia, PA-C  furosemide (LASIX) 40 MG tablet Take 40 mg by mouth daily. 05/09/17  Yes [provider]  latanoprost (XALATAN) 0.005 % ophthalmic solution Place 1 drop into the left eye at bedtime. 03/30/17  Yes [provider]  metoCLOPramide (REGLAN) 5 MG tablet TAKE 1 TABLET BY MOUTH TWICE A DAY AT 8AM AND 10 PM 08/06/17  Yes Milus Banister, MD  omeprazole (PRILOSEC) 40 MG capsule Take 40 mg by mouth 2 (two) times daily.    Yes [provider]  ondansetron (ZOFRAN ODT) 4 MG disintegrating tablet Take 1 tablet (4 mg total) by mouth every 6 (six) hours as needed. 06/05/17  Yes Ward, Delice Bison, DO  oxyCODONE-acetaminophen (PERCOCET) 10-325 MG tablet Take 1 tablet by mouth every 6 (six) hours as needed for pain.   Yes [provider]  predniSONE (DELTASONE) 5 MG tablet Take 5 mg by mouth daily. 06/22/11  Yes [name], MD  PROVENTIL HFA 108 [phone]  Base) MCG/ACT inhaler Inhale 2 puffs into the lungs 4 (four) times daily as needed for shortness of breath. 08/04/17  Yes [provider]  tacrolimus (PROGRAF) 1 MG capsule Take 3 mg by mouth 2 (two) times daily.  06/22/11  Yes [name], MD  zolpidem (AMBIEN) 10 MG tablet Take 10 mg by mouth at bedtime as needed for sleep.   Yes [provider]  azithromycin (ZITHROMAX) 250 MG tablet Take 1 tablet (250 mg total) by mouth daily. Take first 2 tablets together, then 1 every day until finished. Patient not taking: Reported on 08/09/2017 06/05/17   Ward, Delice Bison, DO  metoCLOPramide (REGLAN) 5 MG tablet TAKE 1 TABLET BY MOUTH AT BEDTIME AND  MAY REPEAT DOSE ONE TIME IF NEEDED Patient not taking: Reported on 06/05/2017 10/20/16   Milus Banister, MD  promethazine (PHENERGAN) 25 MG tablet Take 1 tablet (25 mg total) by mouth every 6 (six) hours as needed for nausea or vomiting. Patient not taking: Reported on 06/05/2017 06/18/15   Antonietta Breach, PA-C    Family History Family History  Problem Relation Age of Onset  . Glaucoma Mother   . Multiple sclerosis Brother   . Hypertension Maternal Grandmother   . Breast cancer Neg Hx     Social History Social History   Tobacco Use  . Smoking status: Current Every Day Smoker    Packs/day: 0.20    Years: 28.00    Pack years: 5.60    Types: Cigarettes  . Smokeless tobacco: Never Used  . Tobacco comment: had quit 03/05/2011  Substance Use Topics  . Alcohol use: Yes    Comment: rare  . Drug use: Yes    Types: Marijuana     Allergies   Levofloxacin   Review of Systems Review of Systems  Constitutional: Positive for fever.  Respiratory: Positive for cough and shortness of breath.   Cardiovascular: Negative for chest pain.  Gastrointestinal: Positive for abdominal pain and nausea. Negative for diarrhea and vomiting.  Genitourinary: Positive for flank pain.  Skin: Negative for rash.  All other systems reviewed and are  negative.    Physical Exam Updated Vital Signs BP (!) 83/54   Pulse 72   Temp 99 F (37.2 C) (Oral)   Resp 20   Ht 4\' 11"  (1.499 m)   Wt 50.3 kg (111 lb)   SpO2 100%   BMI 22.42 kg/m   Physical Exam  Constitutional: She is oriented to person, place, and time.  Chronically ill-appearing but nontoxic  HENT:  Head: Normocephalic and atraumatic.  Eyes: Pupils are equal, round, and reactive to light.  Neck: Neck supple.  Cardiovascular: Normal rate, regular rhythm, normal heart sounds and normal pulses.  No murmur heard. Pulmonary/Chest: Effort normal. No respiratory distress. She has no decreased breath sounds. She has no wheezes.  Abdominal: Soft. Bowel sounds are normal. There is tenderness.  Right lower quadrant tenderness to palpation, no rebound or guarding, left flank tenderness to palpation, no overlying skin changes, prior abdominal incisions  Musculoskeletal:       Right lower leg: She exhibits no edema.       Left lower leg: She exhibits no edema.  Neurological: She is alert and oriented to person, place, and time.  Skin: Skin is warm and dry.  Psychiatric: She has a normal mood and affect.  Nursing note and vitals reviewed.    ED Treatments / Results  Labs (all labs ordered are listed, but only abnormal results are displayed) Labs Reviewed  BASIC METABOLIC PANEL - Abnormal; Notable for the following components:      Result Value   CO2 21 (*)    BUN 25 (*)    Creatinine, Ser 2.85 (*)    GFR calc non Af Amer 18 (*)    GFR calc Af Amer 21 (*)    All other components within normal limits  CBC - Abnormal; Notable for the following components:   RBC 2.71 (*)    Hemoglobin 8.4 (*)    HCT 26.2 (*)    RDW 21.4 (*)    All other components within normal limits  URINALYSIS, ROUTINE W REFLEX MICROSCOPIC - Abnormal; Notable for the following components:   APPearance  HAZY (*)    All other components within normal limits  COMPREHENSIVE METABOLIC PANEL - Abnormal;  Notable for the following components:   BUN 25 (*)    Creatinine, Ser 2.95 (*)    AST 12 (*)    GFR calc non Af Amer 18 (*)    GFR calc Af Amer 20 (*)    All other components within normal limits  I-STAT CG4 LACTIC ACID, ED - Abnormal; Notable for the following components:   Lactic Acid, Venous 1.92 (*)    All other components within normal limits  CULTURE, BLOOD (ROUTINE X 2)  CULTURE, BLOOD (ROUTINE X 2)  I-STAT TROPONIN, ED  I-STAT BETA HCG BLOOD, ED ([facility], [facility], [facility] ONLY)  I-STAT CG4 LACTIC ACID, ED    EKG EKG Interpretation  Date/Time:  Monday August 09 2017 14:38:22 EDT Ventricular Rate:  84 PR Interval:  138 QRS Duration: 60 QT Interval:  382 QTC Calculation: 451 R Axis:   74 Text Interpretation:  Normal sinus rhythm Normal ECG Confirmed by Thayer Jew [phone]) on 08/09/2017 3:50:22 PM   Radiology Ct Abdomen Pelvis Wo Contrast  Result Date: 08/09/2017 CLINICAL DATA:  Abdominal pain with diverticulitis suspected. Hypotension. Fever for 1 week EXAM: CT ABDOMEN AND PELVIS WITHOUT CONTRAST TECHNIQUE: Multidetector CT imaging of the abdomen and pelvis was performed following the standard protocol without IV contrast. COMPARISON:  Chest x-ray earlier today FINDINGS: Lower chest: Airspace disease in the right middle and left lower lobes. Interlobular septal thickening at the right base where there is also ground-glass opacity. Posterior mediastinal varices. Hepatobiliary: No focal liver abnormality.No evidence of biliary obstruction or stone. Pancreas: Unremarkable. Spleen: Unremarkable. Adrenals/Urinary Tract: Left adrenal is difficult to visualize. No adrenal abnormality seen. Bilateral native nephrectomy. Transplant kidney in the right lower quadrant without hydronephrosis or collection. Prior failed transplant in the right lower quadrant with dense calcification. Unremarkable bladder. Stomach/Bowel:  No obstruction. No visible inflammation Vascular/Lymphatic: No acute vascular  abnormality. No mass or adenopathy. Venous collaterals in the abdominal wall where there is also postoperative distortion inferiorly. Reproductive:Negative Other: No ascites or pneumoperitoneum. Musculoskeletal: Coarsened trabecular markings compatible with end-stage renal disease. IMPRESSION: 1. Multi lobar pneumonia. 2. Interstitial edema asymmetric to the right lower lobe. 3. No acute finding in the abdomen. 4. Transplant kidneys in the right lower quadrant, one failed and the other with unremarkable appearance. Electronically Signed   By: Monte Fantasia M.D.   On: 08/09/2017 20:49   Dg Chest 2 View  Result Date: 08/09/2017 CLINICAL DATA:  Left-sided chest pain with shortness of breath, cough, and fever. Recent kidney transplant 1 week ago. EXAM: CHEST - 2 VIEW COMPARISON:  Chest x-ray dated July 18, 2017. FINDINGS: Stable mild cardiomegaly with unchanged SVC stent. Normal pulmonary vascularity. Unchanged scarring in the right middle lobe. No focal consolidation, pleural effusion, or pneumothorax. No acute osseous abnormality. IMPRESSION: No active cardiopulmonary disease. Electronically Signed   By: Titus Dubin M.D.   On: 08/09/2017 17:01    Procedures Procedures (including critical care time)   Angiocath insertion Performed by: Merryl Hacker  Consent: Verbal consent obtained. Risks and benefits: risks, benefits and alternatives were discussed Time out: Immediately prior to procedure a "time out" was called to verify the correct patient, procedure, equipment, support staff and site/side marked as required.  Preparation: Patient was prepped and draped in the usual sterile fashion.  Vein Location: R basilic  Ultrasound Guided  Gauge: 20  Normal blood return and flush without difficulty Patient tolerance:  Patient tolerated the procedure well with no immediate complications.  CRITICAL CARE Performed by: Merryl Hacker   Total critical care time:45 minutes  Critical  care time was exclusive of separately billable procedures and treating other patients.  Critical care was necessary to treat or prevent imminent or life-threatening deterioration.  Critical care was time spent personally by me on the following activities: development of treatment plan with patient and/or surrogate as well as nursing, discussions with consultants, evaluation of patient's response to treatment, examination of patient, obtaining history from patient or surrogate, ordering and performing treatments and interventions, ordering and review of laboratory studies, ordering and review of radiographic studies, pulse oximetry and re-evaluation of patient's condition.   Medications Ordered in ED Medications  0.9 %  sodium chloride infusion (has no administration in time range)  sodium chloride 0.9 % bolus 1,000 mL (0 mLs Intravenous Stopped 08/09/17 1649)    And  sodium chloride 0.9 % bolus 500 mL (0 mLs Intravenous Stopped 08/09/17 1833)    And  sodium chloride 0.9 % bolus 250 mL (0 mLs Intravenous Stopped 08/09/17 1727)  vancomycin (VANCOCIN) IVPB 1000 mg/200 mL premix (0 mg Intravenous Stopped 08/09/17 1833)  piperacillin-tazobactam (ZOSYN) IVPB 3.375 g (0 g Intravenous Stopped 08/09/17 1746)  morphine 4 MG/ML injection 4 mg (4 mg Intravenous Given 08/09/17 2015)     Initial Impression / Assessment and Plan / ED Course  I have reviewed the triage vital signs and the nursing notes.  Pertinent labs & imaging results that were available during my care of the patient were reviewed by me and considered in my medical decision making (see chart for details).     Patient presents with fever.  She is chronically immunosuppressed.  She is overall nontoxic-appearing.  Initial vital signs notable for temperature of 100.1 and a blood pressure 86/56.  Patient reports that at baseline her blood pressures are around 90 systolic.  Sepsis work-up was initiated.  Initial lactate mildly elevated.  She does report  cough and abdominal pain.  Denies urinary symptoms.  No significant leukocytosis but the patient is immunosuppressed.  Patient was given 30 cc/kg fluid and vancomycin and Zosyn for unknown etiology.  Initial chest x-ray is clear.  CT scan without contrast of the abdomen obtained to evaluate for intra-abdominal cause.  Urinalysis without evidence of UTI.  Abdominal CT is largely reassuring but does show evidence of likely multifocal pneumonia.  This would be consistent with her cough and fever.  Blood pressures have largely been 21-30 systolic.  She did receive a full 30 cc/kg.  She was placed on maintenance fluids.  Given that this is close to the patient's baseline and lactate is cleared, I do not feel her hypotension is sepsis related at this time.  Will admit given her immunosuppression and history of bacteremia.  The patient is noted to have a MAP's <65/ SBP's <90. With the current information available to me, I don't think the patient is in septic shock. The MAP's <65/ SBP's <90, is related to normal for the patient - chart reviewed with SBP ~90.    Final Clinical Impressions(s) / ED Diagnoses   Final diagnoses:  Multifocal pneumonia    ED Discharge Orders    None       Merryl Hacker, MD 08/09/17 2111

## 2017-08-09 NOTE — H&P (Addendum)
[facility] Admission History and Physical Service Pager: [phone]  Patient name: Tina Watkins Medical record number: [id number] Date of birth: Jun 30, 1965 Age: 52 y.o. Gender: female  Primary Care Provider: Donato Heinz, MD Consultants: none Code Status: full  Chief Complaint: cough and pelvic pain  Assessment and Plan: Tina Watkins is a 52 y.o. female presenting with fevers, cough, and abdominal pain. PMH is significant for kidney transplant, gastroparesis, chronic pain syndrome, gout, depression, anxiety, tobacco use  CAP Patient presented to the ED with ongoing cough with sputum production and shortness breath.  Vital signs on admission were significant for blood pressure of 83/52 (patient reports baseline low blood pressure).  Chest x-ray in the ED with retrocardiac consolidation.  CT chest significant for multilobar pneumonia and interstitial edema primarily in the right lobe.  Lactate in the ED of 1.9 and white blood cell count of 6.6.  Of note patient is a post transplant patient and is on chronic immunosuppression, which could account for her normal WBC in the presence of infection.  Due to meeting sepsis criteria with HR>90 and tachypnea with a source of infection, patient was started on vancomycin and zosyn in the ED and given NS boluses totaling 1.75 L.  Patient was never hypoxic, but O2 was given for comfort.  Although patient has had multiple ED visits in the past 90 days, she has not been admitted and therefore does not qualify for HCAP.   -Admit to [facility] -Attending Dr. Andria Frames -Monitor vitals -Up with assistance - albuterol PRN for SOB - start ceftriaxone and azithromycin IV on 7/9 - HIV antibody -Legionella  -Strep pneumonia urinary antigen - wean O2 as tolerated   Chest pain Patient has reported ongoing chest pain for roughly the same duration as her side and back pain.  Pain is described as someone standing on her chest,  with radiation to her left side and back around her left shoulder.  EKG in the ED was normal, troponin 0.0.  Pain was reproducible on exam and seems concordant with her generalized, chronic musculoskeletal pain. - We will continue to monitor - No intervention at this time  Pain management Patient has a history of chronic pain syndrome.  Patient reports new significant chest side back and pelvic pain.  At home patient treats her pain with Percocet 10-325 Q 6 hrs.  She is not a good candidate for NSAIDs due to her single functioning kidney and chronic kidney disease. -Oxy 5 every 6 -Tylenol 650 every 6  Gastroparesis Patient has a history of gastroparesis since 2010. Currently stable.  Home medications include metoclopramide 5 mg twice daily. -Continue Reglan  ?AKI on ESRD status post kidney transplant Patient has a history of ESRD with first renal transplant in 1989.  Patient is now on her third transplanted kidney.  Last transplant placed in 2005.  Patient reports no issues with current kidney.  Patient reports the baseline creatinine is around 2.0, although it ranges from 2-3.  Creatinine on admission is 2.95. Patient's home medications include prednisone, tacrolimus, azathioprine and furosemide. -Continue home medications - avoid nephrotoxic agents - avoid NSAIDs - monitor BMP; patient's creatinine will hopefully decrease d/t fluid administration  Anemia Admission labs significant for hemoglobin of 8.4.  Baseline appears to be around 9.5.  Patient has a known history of anemia of chronic disease likely secondary to ESRD. -We will continue to monitor  Tobacco use: smokes one half pack per day.  Declines nicotine patch  GERD Patient  takes omeprazole at home. -Continue as pantoprazole  Gout Patient takes colchicine PRN at home.  She feels like she might be getting a gout flair currently. - colchicine 1.2 mg then 0.6 mg one hour after; do not repeat d/t patient's low creatinine  clearance  FEN/GI: renal diet Prophylaxis: heparin  Disposition: stable  History of Present Illness:  Tina Watkins is a 52 y.o. female presenting with one week history of fevers, cough, and abdominal pain.  Her cough has actually been going on for months, but ED visits and PCP visits have not found a reason for this cough.  She has completed at least one course of antibiotics (azithromycin per 5/4 ED note) previously for present cough. She also reports rib, back, pelvis, and chest pain due to her cough, which has worsened over the past week.  Any movement is very painful for her.  She endorses chest pressure in the middle of her chest that radiates to her left side and left upper back and muscle pain all over.  She had a fever as high as 102, starting on Saturday, 7/6.  Movement and cough exacerbate the pain, and percocet makes the pain better.   She came to the emergency room because she was worried that her pelvic pain meant that she was having trouble with her kidney.  She has had diarrhea since May, which she thinks is from the antibiotics she's been taking.  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  Her current transplant has had no problems.    In the ED, she received vancomycin and zosyn and 30 cc/kg fluid.  Review Of Systems: Per HPI with the following additions:   Review of Systems  Constitutional: Positive for chills and fever.  Respiratory: Positive for cough, sputum production and shortness of breath. Negative for hemoptysis.   Cardiovascular: Positive for chest pain and palpitations.  Gastrointestinal: Positive for diarrhea and vomiting. Negative for abdominal pain and nausea.       Post tussive emesis  Genitourinary: Negative for dysuria and hematuria.  Musculoskeletal: Positive for back pain.  Psychiatric/Behavioral: Positive for depression. Negative for suicidal ideas. The patient is nervous/anxious.      Patient Active Problem List   Diagnosis Date Noted  . Chest pain 09/29/2015  . Cervical stenosis of spinal canal 01/08/2015  . Urinary tract infectious disease   . Muscle spasms of neck 06/15/2014  . Bleeding hemorrhoid 06/15/2014  . Anemia in chronic kidney disease 06/15/2014  . Acute on chronic renal failure ([place]) 06/13/2014  . Pyelonephritis, acute 06/13/2014  . Sepsis ([place]) 06/13/2014  . History of renal transplant   . Chronic pain disorder 04/09/2012  . Dehydration, mild 04/09/2012  . Gout attack 06/23/2011  . Herpes infection 06/23/2011  . Anxiety 06/23/2011  . E coli bacteremia 06/18/2011  . ESRD (end stage renal disease) ([place]) 06/12/2011  . UTI (lower urinary tract infection) 06/12/2011  . Bacteremia due to Gram-negative bacteria 05/23/2011  . History of renal transplantation 05/22/2011  . Septic shock(785.52) 05/22/2011  . Acute on chronic kidney failure ([place]) 05/22/2011  . Gastroparesis 04/24/2008  . WEIGHT LOSS 08/24/2007  . NAUSEA AND VOMITING 08/24/2007    Past Medical History: Past Medical History:  Diagnosis Date  . Bacteremia due to Gram-negative bacteria 05/23/2011  . Blind right eye   . Chronic lower back pain   . Complication of anesthesia    "woke up during OR; I have an extremely  high tolerance" (12/11/2011) 1 procedure was graft; the other procedures were procedures that are typically done with sedation.  . DDD (degenerative disc disease), cervical   . Depression   . Dysrhythmia    "tachycardia" (12/11/2011) new onset afib 10/15/14 EKG  . E coli bacteremia 06/18/2011  . ESRD (end stage renal disease) ([place]) 06/12/2011  . Fibromyalgia   . Gastroparesis   . Gastroparesis   . GERD (gastroesophageal reflux disease)   . Glaucoma    right eye  . Gout   . H/O carpal tunnel syndrome   . Headache(784.0)    "not often anymore" (12/11/2011)  . Herpes genitalia 1994  . History of blood transfusion    "more than a few times" (12/11/2011)  . History of  stomach ulcers   . Hypotension   . Iron deficiency anemia   . New onset a-fib ([place])    10/15/14 EKG  . Osteopenia   . Seizures ([place]) 1994   "post transplant; only have had that one" (12/11/2011)  . Spinal stenosis in cervical region   . Stroke [facility])     left basal ganglia lacunar infarct; Right frontal lobe lacunar infarct.  . Stroke [facility]) ~ 1999; 2001   "briefly lost my vision; lost my right eye" (12/11/2011)    Past Surgical History: Past Surgical History:  Procedure Laterality Date  . ANTERIOR CERVICAL DECOMP/DISCECTOMY FUSION N/A 01/08/2015   Procedure: Anterior Cervical Three-Four/Four-Five Decompression/Diskectomy/Fusion;  Surgeon: Leeroy Cha, MD;  Location: [facility]URO ORS;  Service: Neurosurgery;  Laterality: N/A;  C3-4 C4-5 Anterior cervical decompression/diskectomy/fusion  . APPENDECTOMY  ~ 2004  . CATARACT EXTRACTION     right eye  . COLONOSCOPY    . ENUCLEATION  2001   "right"  . ESOPHAGOGASTRODUODENOSCOPY (EGD) WITH PROPOFOL N/A 04/21/2012   Procedure: ESOPHAGOGASTRODUODENOSCOPY (EGD) WITH PROPOFOL;  Surgeon: Milus Banister, MD;  Location: [facility] ENDOSCOPY;  Service: Endoscopy;  Laterality: N/A;  . 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 [place]; 1999; 2005   "right"  . MULTIPLE TOOTH EXTRACTIONS    . TONSILLECTOMY    . TOTAL NEPHRECTOMY  1988?; 1994; 2005    Social History: Social History   Tobacco Use  . Smoking status: Current Every Day Smoker    Packs/day: 0.20    Years: 28.00    Pack years: 5.60    Types: Cigarettes  . Smokeless tobacco: Never Used  . Tobacco comment: had quit 03/05/2011  Substance Use Topics  . Alcohol use: Yes    Comment: rare  . Drug use: Yes    Types: Marijuana   Additional social history: Please also refer to relevant sections of EMR.  Family History: Family History  Problem Relation Age of Onset  . Glaucoma Mother   . Multiple sclerosis Brother    . Hypertension Maternal Grandmother   . Breast cancer Neg Hx     Allergies and Medications: Allergies  Allergen Reactions  . Levofloxacin Itching and Rash   No current facility-administered medications on file prior to encounter.    Current Outpatient Medications on File Prior to Encounter  Medication Sig Dispense Refill  . ALPRAZolam (XANAX) 0.25 MG tablet Take 0.25 mg by mouth daily as needed (FOR SVT).    [name] aspirin EC 81 MG tablet Take 81 mg by mouth daily.     [name] azaTHIOprine (IMURAN) 50 MG tablet Take 100 mg by mouth daily.     [name]  fluticasone (FLONASE) 50 MCG/ACT nasal spray Place 1 spray into both nostrils daily. 16 g 0  . furosemide (LASIX) 40 MG tablet Take 40 mg by mouth daily.  2  . latanoprost (XALATAN) 0.005 % ophthalmic solution Place 1 drop into the left eye at bedtime.  3  . metoCLOPramide (REGLAN) 5 MG tablet TAKE 1 TABLET BY MOUTH TWICE A DAY AT 8AM AND 10 PM 60 tablet 3  . omeprazole (PRILOSEC) 40 MG capsule Take 40 mg by mouth 2 (two) times daily.     . ondansetron (ZOFRAN ODT) 4 MG disintegrating tablet Take 1 tablet (4 mg total) by mouth every 6 (six) hours as needed. 20 tablet 0  . oxyCODONE-acetaminophen (PERCOCET) 10-325 MG tablet Take 1 tablet by mouth every 6 (six) hours as needed for pain.    . predniSONE (DELTASONE) 5 MG tablet Take 5 mg by mouth daily.    [name] PROVENTIL HFA 108 (90 Base) MCG/ACT inhaler Inhale 2 puffs into the lungs 4 (four) times daily as needed for shortness of breath.  2  . tacrolimus (PROGRAF) 1 MG capsule Take 3 mg by mouth 2 (two) times daily.     [name] zolpidem (AMBIEN) 10 MG tablet Take 10 mg by mouth at bedtime as needed for sleep.    [name] azithromycin (ZITHROMAX) 250 MG tablet Take 1 tablet (250 mg total) by mouth daily. Take first 2 tablets together, then 1 every day until finished. (Patient not taking: Reported on 08/09/2017) 6 tablet 0  . metoCLOPramide (REGLAN) 5 MG tablet TAKE 1 TABLET BY MOUTH AT BEDTIME AND MAY REPEAT DOSE ONE TIME IF  NEEDED (Patient not taking: Reported on 06/05/2017) 60 tablet 0  . promethazine (PHENERGAN) 25 MG tablet Take 1 tablet (25 mg total) by mouth every 6 (six) hours as needed for nausea or vomiting. (Patient not taking: Reported on 06/05/2017) 12 tablet 0    Objective: BP (!) 87/52   Pulse 63   Temp 99 F (37.2 C) (Oral)   Resp 15   Ht 4\' 11"  (1.499 m)   Wt 111 lb (50.3 kg)   SpO2 96%   BMI 22.42 kg/m  Exam: Physical Exam  Constitutional: She appears ill.  Patient sitting up in bed with 2 L nasal cannula..  Tired throughout the exam, took time answering questions, was able to finish sentences.  HENT:  Head: Normocephalic and atraumatic.  Cardiovascular: Regular rhythm, intact distal pulses and normal pulses.  Murmur heard.  Crescendo decrescendo systolic murmur is present with a grade of 3/6.  No diastolic murmur is present. Pulmonary/Chest: No accessory muscle usage. She has rhonchi in the left lower field.  Abdominal: Soft. There is tenderness. There is no guarding.  Significant scar tissue across abdomen from prior surgeries.  Palpable kidney in right lower quadrant.  Musculoskeletal:       Right lower leg: She exhibits no edema.       Left lower leg: She exhibits no edema.  Lymphadenopathy:    She has no cervical adenopathy.  Neurological: She is alert. No cranial nerve deficit.  Psychiatric: She has a normal mood and affect. Her behavior is normal.     Labs and Imaging: CBC BMET  Recent Labs  Lab 08/09/17 1452  WBC 6.6  HGB 8.4*  HCT 26.2*  PLT 240   Recent Labs  Lab 08/09/17 1612  NA 137  K 4.3  CL 102  CO2 23  BUN 25*  CREATININE 2.95*  GLUCOSE 95  CALCIUM 9.5  Ct Abdomen Pelvis Wo Contrast  Result Date: 08/09/2017 CLINICAL DATA:  Abdominal pain with diverticulitis suspected. Hypotension. Fever for 1 week EXAM: CT ABDOMEN AND PELVIS WITHOUT CONTRAST TECHNIQUE: Multidetector CT imaging of the abdomen and pelvis was performed following the standard  protocol without IV contrast. COMPARISON:  Chest x-ray earlier today FINDINGS: Lower chest: Airspace disease in the right middle and left lower lobes. Interlobular septal thickening at the right base where there is also ground-glass opacity. Posterior mediastinal varices. Hepatobiliary: No focal liver abnormality.No evidence of biliary obstruction or stone. Pancreas: Unremarkable. Spleen: Unremarkable. Adrenals/Urinary Tract: Left adrenal is difficult to visualize. No adrenal abnormality seen. Bilateral native nephrectomy. Transplant kidney in the right lower quadrant without hydronephrosis or collection. Prior failed transplant in the right lower quadrant with dense calcification. Unremarkable bladder. Stomach/Bowel:  No obstruction. No visible inflammation Vascular/Lymphatic: No acute vascular abnormality. No mass or adenopathy. Venous collaterals in the abdominal wall where there is also postoperative distortion inferiorly. Reproductive:Negative Other: No ascites or pneumoperitoneum. Musculoskeletal: Coarsened trabecular markings compatible with end-stage renal disease. IMPRESSION: 1. Multi lobar pneumonia. 2. Interstitial edema asymmetric to the right lower lobe. 3. No acute finding in the abdomen. 4. Transplant kidneys in the right lower quadrant, one failed and the other with unremarkable appearance. Electronically Signed   By: Monte Fantasia M.D.   On: 08/09/2017 20:49   Dg Chest 2 View  Result Date: 08/09/2017 CLINICAL DATA:  Left-sided chest pain with shortness of breath, cough, and fever. Recent kidney transplant 1 week ago. EXAM: CHEST - 2 VIEW COMPARISON:  Chest x-ray dated July 18, 2017. FINDINGS: Stable mild cardiomegaly with unchanged SVC stent. Normal pulmonary vascularity. Unchanged scarring in the right middle lobe. No focal consolidation, pleural effusion, or pneumothorax. No acute osseous abnormality. IMPRESSION: No active cardiopulmonary disease. Electronically Signed   By: Titus Dubin  M.D.   On: 08/09/2017 17:01   Dg Chest 2 View  Result Date: 07/18/2017 CLINICAL DATA:  Cough and congestion. EXAM: CHEST - 2 VIEW COMPARISON:  06/05/2017 FINDINGS: Mild cardiac enlargement. A stent is identified within the superior vena cava. No pleural effusion or edema identified. No airspace opacities identified. IMPRESSION: 1. No acute cardiopulmonary abnormalities. Electronically Signed   By: Kerby Moors M.D.   On: 07/18/2017 16:56     Matilde Haymaker, MD 08/09/2017, 9:26 PM PGY-1, [place] Intern pager: [phone], text pages welcome  FPTS Upper-Level Resident Addendum   I have independently interviewed and examined the patient. I have discussed the above with the original author and agree with their documentation. My edits for correction/addition/clarification are in blue. Please see also any attending notes.    Kathrene Alu, MD PGY-2, [facility] 08/10/2017 1:42 AM  FPTS Service pager: [phone] (text pages welcome through [facility])

## 2017-08-09 NOTE — ED Notes (Signed)
Patient transported to X-ray 

## 2017-08-09 NOTE — ED Provider Notes (Signed)
Patient placed in Quick Look pathway, seen and evaluated   Chief Complaint: Cough  HPI:   Patient patient who is status post kidney transplant presents with 1 week history of fevers, cough productive of yellow-green sputum, shortness of breath, chest pain.  She notes left-sided chest pain which worsens with deep inspiration and cough as well as diffuse low back pain.  She denies urinary symptoms but has had a couple episodes of watery nonbloody diarrhea.  She notes left upper quadrant abdominal pain.  Notes fever with max temperature of 102.7 F.  Has taken aspirin with mild improvement.  ROS: Positive for fever, chest pain, shortness of breath, diarrhea, abdominal pain, back pain, nausea  Negative for urinary symptoms, vomiting  Physical Exam:   Gen: Appears uncomfortable  Neuro: Awake and Alert  Skin: Warm    Focused Exam: Mildly tachypneic, diffuse crackles on auscultation of the lungs.  Speaking in full sentences.  Heart rate and rhythm is regular.  Mild left upper quadrant abdominal tenderness, no distention noted.  No midline spine tenderness but diffuse paralumbar muscle tenderness noted.  Initiation of care has begun. The patient has been counseled on the process, plan, and necessity for staying for the completion/evaluation, and the remainder of the medical screening examination    Renita Papa, PA-C 08/09/17 [facility], Kevin, MD 08/10/17 1614

## 2017-08-09 NOTE — ED Notes (Signed)
Patient reports temperature X 1 week, cough X months, current smoker, chest pain and back pain, she states "it feels like my ribs are caving in"

## 2017-08-10 ENCOUNTER — Inpatient Hospital Stay (HOSPITAL_COMMUNITY): Payer: Medicaid Other

## 2017-08-10 LAB — BASIC METABOLIC PANEL
Anion gap: 14 (ref 5–15)
BUN: 24 mg/dL — ABNORMAL HIGH (ref 6–20)
CO2: 16 mmol/L — ABNORMAL LOW (ref 22–32)
Calcium: 8.5 mg/dL — ABNORMAL LOW (ref 8.9–10.3)
Chloride: 106 mmol/L (ref 98–111)
Creatinine, Ser: 2.57 mg/dL — ABNORMAL HIGH (ref 0.44–1.00)
GFR calc Af Amer: 24 mL/min — ABNORMAL LOW (ref >60)
GFR calc non Af Amer: 21 mL/min — ABNORMAL LOW (ref >60)
Glucose, Bld: 118 mg/dL — ABNORMAL HIGH (ref 70–99)
Potassium: 4.3 mmol/L (ref 3.5–5.1)
Sodium: 136 mmol/L (ref 135–145)

## 2017-08-10 LAB — CBC
HCT: 23 % — ABNORMAL LOW (ref 36.0–46.0)
Hemoglobin: 7.1 g/dL — ABNORMAL LOW (ref 12.0–15.0)
MCH: 30.3 pg (ref 26.0–34.0)
MCHC: 30.9 g/dL (ref 30.0–36.0)
MCV: 98.3 fL (ref 78.0–100.0)
Platelets: 224 10*3/uL (ref 150–400)
RBC: 2.34 MIL/uL — ABNORMAL LOW (ref 3.87–5.11)
RDW: 21.8 % — ABNORMAL HIGH (ref 11.5–15.5)
WBC: 5.4 10*3/uL (ref 4.0–10.5)

## 2017-08-10 LAB — STREP PNEUMONIAE URINARY ANTIGEN: Strep Pneumo Urinary Antigen: NEGATIVE

## 2017-08-10 LAB — HIV ANTIBODY (ROUTINE TESTING W REFLEX): HIV Screen 4th Generation wRfx: NONREACTIVE

## 2017-08-10 LAB — EPSTEIN BARR VRS(EBV DNA BY PCR)
EBV DNA QN by PCR: POSITIVE copies/mL
log10 EBV DNA Qn PCR: UNDETERMINED log10 copy/mL

## 2017-08-10 MED ORDER — SODIUM CHLORIDE 0.9 % IV SOLN
500.0000 mg | INTRAVENOUS | Status: AC
  Administered 2017-08-10: 500 mg via INTRAVENOUS
  Filled 2017-08-10 (×2): qty 500

## 2017-08-10 MED ORDER — PIPERACILLIN-TAZOBACTAM IN DEX 2-0.25 GM/50ML IV SOLN
2.2500 g | Freq: Three times a day (TID) | INTRAVENOUS | Status: DC
  Administered 2017-08-10 – 2017-08-12 (×6): 2.25 g via INTRAVENOUS
  Filled 2017-08-10 (×6): qty 50

## 2017-08-10 MED ORDER — OXYCODONE HCL 5 MG PO TABS
10.0000 mg | ORAL_TABLET | Freq: Four times a day (QID) | ORAL | Status: DC | PRN
  Administered 2017-08-10 – 2017-08-17 (×24): 10 mg via ORAL
  Filled 2017-08-10 (×24): qty 2

## 2017-08-10 MED ORDER — SODIUM CHLORIDE 0.9 % IV BOLUS
1000.0000 mL | Freq: Once | INTRAVENOUS | Status: AC
  Administered 2017-08-10: 1000 mL via INTRAVENOUS

## 2017-08-10 MED ORDER — VANCOMYCIN HCL 500 MG IV SOLR
500.0000 mg | INTRAVENOUS | Status: DC
  Administered 2017-08-10 – 2017-08-11 (×2): 500 mg via INTRAVENOUS
  Filled 2017-08-10 (×3): qty 500

## 2017-08-10 MED ORDER — PREDNISONE 50 MG PO TABS: 50.0000 mg | ORAL_TABLET | Freq: Every day | ORAL | Status: DC

## 2017-08-10 MED ORDER — PREDNISONE 50 MG PO TABS
50.0000 mg | ORAL_TABLET | Freq: Every day | ORAL | Status: AC
  Administered 2017-08-10 – 2017-08-14 (×5): 50 mg via ORAL
  Filled 2017-08-10 (×5): qty 1

## 2017-08-10 MED ORDER — COLCHICINE 0.6 MG PO TABS: 0.6000 mg | ORAL_TABLET | Freq: Once | ORAL | Status: DC

## 2017-08-10 MED ORDER — COLCHICINE 0.6 MG PO TABS
1.2000 mg | ORAL_TABLET | Freq: Once | ORAL | Status: AC
  Administered 2017-08-10: 1.2 mg via ORAL
  Filled 2017-08-10: qty 2

## 2017-08-10 MED ORDER — TUBERCULIN PPD 5 UNIT/0.1ML ID SOLN
5.0000 [IU] | Freq: Once | INTRADERMAL | Status: AC
  Administered 2017-08-10: 5 [IU] via INTRADERMAL
  Filled 2017-08-10 (×2): qty 0.1

## 2017-08-10 MED ORDER — AZATHIOPRINE 50 MG PO TABS
100.0000 mg | ORAL_TABLET | Freq: Every day | ORAL | Status: DC
  Administered 2017-08-10 – 2017-08-17 (×9): 100 mg via ORAL
  Filled 2017-08-10 (×10): qty 2

## 2017-08-10 NOTE — Progress Notes (Signed)
Apical pulse count -74 beats per minutes.

## 2017-08-10 NOTE — Progress Notes (Addendum)
Pharmacy Antibiotic Note  Tina Watkins is a 52 y.o. female admitted on 08/09/2017 with pneumonia.  Pharmacy has been consulted for vancomycin and zosyn dosing. Patient received Vanc 1000mg  and Zosyn 3.375 7/8@1658  in the ED. Of note patient has renal transplant on immunosuppressants and baseline Scr ~2.  Plan: Vancomycin 500mg  IV every 24 hours.  Goal trough 15-20 mcg/mL. Zosyn 2.25g IV q8h    Height: 4\' 11"  (149.9 cm) Weight: 111 lb (50.3 kg) IBW/kg (Calculated) : 43.2  Temp (24hrs), Avg:98.8 F (37.1 C), Min:97.7 F (36.5 C), Max:100.1 F (37.8 C)  Recent Labs  Lab 08/06/17 0925 08/09/17 1452 08/09/17 1519 08/09/17 1612 08/09/17 1733 08/10/17 0417  WBC  --  6.6  --   --   --  5.4  CREATININE 2.22* 2.85*  --  2.95*  --  2.57*  LATICACIDVEN  --   --  1.92*  --  1.01  --     Estimated Creatinine Clearance: 18.1 mL/min (A) (by C-G formula based on SCr of 2.57 mg/dL (H)).    Allergies  Allergen Reactions  . Levofloxacin Itching and Rash    Antimicrobials this admission: 7/8 Vancomycin >>  7/8 Zosyn >>  7/9 Azithromycin >> 7/9 7/9 Ceftriaxone >> 7/9  Dose adjustments this admission:   Microbiology results: 7/8 BCx: ngtd   Thank you for allowing pharmacy to be a part of this patient's care.  Isaias Sakai, Sherian Rein D PGY1 Pharmacy Resident  Phone [phone] 08/10/2017      11:27 AM   Horton Chin, Pharm.D., BCPS Clinical Pharmacist Pager: [phone] Clinical phone for 08/10/2017 from 8:30-4:00 is [phone].  **Pharmacist phone directory can now be found on [email or web address] ([email or web address]).  Listed under [place].  08/10/2017 12:04 PM

## 2017-08-10 NOTE — Progress Notes (Signed)
Family Medicine Teaching Service Daily Progress Note Intern Pager: [phone]  Patient name: Tina Watkins Medical record number: [id number] Date of birth: 06/24/1965 Age: 52 y.o. Gender: female  Primary Care Provider: Donato Heinz, MD Consultants:  Code Status: full  Pt Overview and Major Events to Date:  7/8 admitted  Assessment and Plan: Tina Watkins is a 52 y.o. female presenting with fevers, cough, and abdominal pain. PMH is significant for kidney transplant, gastroparesis, chronic pain syndrome, gout, depression, anxiety, tobacco use  CAP- still with new 2L O2 req, hypoxic to mid 80s on RA Vital signs on admission were significant for blood pressure of 83/52 (patient reports baseline low blood pressure which appears true per chart review).  Chest x-ray in the ED with retrocardiac consolidation.  CT chest significant for multilobar pneumonia and interstitial edema primarily in the right lobe.  Lactate in the ED of 1.9 and white blood cell count of 6.6 improving to 5.4 am 7/9.  Of note patient is a post transplant patient and is on chronic immunosuppression, which could account for her normal WBC in the presence of infection.  Due to meeting sepsis criteria with HR>90 and tachypnea with a source of infection, patient was started on vancomycin and zosyn in the ED and given NS boluses totaling 1.75 L.  Although patient has had multiple ED visits in the past 90 days, she has not been admitted and therefore does not qualify for HCAP.   -Monitor vitals -Up with assistance - albuterol PRN for SOB - vanc/zosyn per pharm  -prednisone 50x5days (7/9- ) then back to home 5mg  daily - HIV antibody -Legionella  -Strep pneumonia urinary antigen - wean O2 as tolerated   Chest pain- chronic, unchanged Patient has reported ongoing chest pain for roughly the same duration as her side and back pain.  Pain is described as someone standing on her chest, with radiation to her left side and  back around her left shoulder.  EKG in the ED was normal, troponin 0.0.  Pain was reproducible on exam and seems concordant with her generalized, chronic musculoskeletal pain. - We will continue to monitor - No intervention at this time  Pain management Patient has a history of chronic pain syndrome.  Patient reports new significant chest side back and pelvic pain.  At home patient treats her pain with Percocet 10-325 Q 6 hrs.  She is not a good candidate for NSAIDs due to her single functioning kidney and chronic kidney disease. -increase to home oxy10 q6prn -Tylenol 650 every 6  Gastroparesis Patient has a history of gastroparesis since 2010. Currently stable.  Home medications include metoclopramide 5 mg twice daily. -Continue Reglan  ?AKI on ESRD status post kidney transplant: Cr 2.95->2.57 (baseline appears ~2) Patient has a history of ESRD with first renal transplant in 1989.  Patient is now on her third transplanted kidney.  Last transplant placed in 2005.  Patient reports no issues with current kidney.  Patient reports the baseline creatinine is around 2.0, although it ranges from 2-3.  Creatinine on admission is 2.95. Patient's home medications include prednisone, tacrolimus, azathioprine and furosemide. -Continue home medications - avoid nephrotoxic agents - avoid NSAIDs - monitor BMP; patient's creatinine will hopefully decrease d/t fluid administration  Anemia- down to 7.1 Admission labs significant for hemoglobin of 8.4.  Baseline appears to be around 9.5.  Patient has a known history of anemia of chronic disease likely secondary to ESRD. -We will continue to monitor, will likely transfuse if continues  to drop  Tobacco use: smokes one half pack per day.  Declines nicotine patch  GERD Patient takes omeprazole at home. -Continue as pantoprazole  Gout Patient takes colchicine PRN at home.  She feels like she might be getting a gout flair currently. - d/c  colchicine  FEN/GI: renal diet Prophylaxis: heparin  Disposition: watcher, will need to determine plan for either IVF or furosemide, was on both overnight  Subjective:  Was fixated on cost of flights for leaving earlier but willing to stay after we called [employer] for her.  Now requesting colchicine and return to home oxy10 q6  Objective: Temp:  [98.4 F (36.9 C)-100.1 F (37.8 C)] 98.4 F (36.9 C) (07/09 0129) Pulse Rate:  [26-100] 58 (07/09 0546) Resp:  [14-37] 16 (07/09 0546) BP: (83-114)/(45-67) 86/46 (07/09 0546) SpO2:  [93 %-100 %] 100 % (07/09 0546) Weight:  [111 lb (50.3 kg)] 111 lb (50.3 kg) (07/08 1448) Physical Exam: General: ill appearing, was focused on leaving for flight Cardiovascular: RRR, 2/6 murmur Respiratory: course lower lungs, IWB on 2L [place], did have cough durign exam (non-productive, no hemoptosis Abdomen: soft belly with no tenderness claimed to exam Extremities: no pitting edema on exam, complains of pain (chronic) when sitting up/moving but not changed from baseline  Laboratory: Recent Labs  Lab 08/06/17 0933 08/09/17 1452 08/10/17 0417  WBC  --  6.6 5.4  HGB 9.6* 8.4* 7.1*  HCT  --  26.2* 23.0*  PLT  --  240 224   Recent Labs  Lab 08/06/17 0925 08/09/17 1452 08/09/17 1612 08/10/17 0417  NA 139 136 137 136  K 4.7 4.4 4.3 4.3  CL 108 103 102 106  CO2 20* 21* 23 16*  BUN 19 25* 25* 24*  CREATININE 2.22* 2.85* 2.95* 2.57*  CALCIUM 9.2 9.2 9.5 8.5*  PROT 6.9  --  7.2  --   BILITOT 0.9  --  0.7  --   ALKPHOS 118  --  123  --   ALT 8  --  8  --   AST 12*  --  12*  --   GLUCOSE 103* 95 95 118*    Imaging/Diagnostic Tests: Ct Abdomen Pelvis Wo Contrast  Result Date: 08/09/2017 CLINICAL DATA:  Abdominal pain with diverticulitis suspected. Hypotension. Fever for 1 week EXAM: CT ABDOMEN AND PELVIS WITHOUT CONTRAST TECHNIQUE: Multidetector CT imaging of the abdomen and pelvis was performed following the standard protocol without IV contrast.  COMPARISON:  Chest x-ray earlier today FINDINGS: Lower chest: Airspace disease in the right middle and left lower lobes. Interlobular septal thickening at the right base where there is also ground-glass opacity. Posterior mediastinal varices. Hepatobiliary: No focal liver abnormality.No evidence of biliary obstruction or stone. Pancreas: Unremarkable. Spleen: Unremarkable. Adrenals/Urinary Tract: Left adrenal is difficult to visualize. No adrenal abnormality seen. Bilateral native nephrectomy. Transplant kidney in the right lower quadrant without hydronephrosis or collection. Prior failed transplant in the right lower quadrant with dense calcification. Unremarkable bladder. Stomach/Bowel:  No obstruction. No visible inflammation Vascular/Lymphatic: No acute vascular abnormality. No mass or adenopathy. Venous collaterals in the abdominal wall where there is also postoperative distortion inferiorly. Reproductive:Negative Other: No ascites or pneumoperitoneum. Musculoskeletal: Coarsened trabecular markings compatible with end-stage renal disease. IMPRESSION: 1. Multi lobar pneumonia. 2. Interstitial edema asymmetric to the right lower lobe. 3. No acute finding in the abdomen. 4. Transplant kidneys in the right lower quadrant, one failed and the other with unremarkable appearance. Electronically Signed   By: Neva Seat.D.  On: 08/09/2017 20:49   Dg Chest 2 View  Result Date: 08/09/2017 CLINICAL DATA:  Left-sided chest pain with shortness of breath, cough, and fever. Recent kidney transplant 1 week ago. EXAM: CHEST - 2 VIEW COMPARISON:  Chest x-ray dated July 18, 2017. FINDINGS: Stable mild cardiomegaly with unchanged SVC stent. Normal pulmonary vascularity. Unchanged scarring in the right middle lobe. No focal consolidation, pleural effusion, or pneumothorax. No acute osseous abnormality. IMPRESSION: No active cardiopulmonary disease. Electronically Signed   By: Titus Dubin M.D.   On: 08/09/2017 17:01      Sherene Sires, DO 08/10/2017, 9:08 AM PGY-2, [place] Intern pager: [phone], text pages welcome

## 2017-08-10 NOTE — Progress Notes (Signed)
Patient's nurse paged [name] saying that Tina Watkins wanted her antibiotics given early this morning so that she could leave.  Patient did not express with request when we talked with her on admission earlier in the night.  I discussed Tina Watkins's request with her, and she said that her father has pancreatic cancer, and she has already booked a flight for today to visit him for an annual get together.  I told her that since she has only received vancomycin and zosyn for her pneumonia and since she is immunocompromised, it would not be advisable for her to leave the hospital today with only PO antibiotics.  She also continues to be on oxygen for comfort.  I explained that I would discuss this with my attending physician and that if he says that it would not be medically safe for her to leave the hospital this morning, she would have to leave AMA, and insurance may not cover her hospital stay.  She expressed understanding and plans to leave the hospital this morning.  Tina Rosenwald C. Shan Levans, MD PGY-2, [facility] 08/10/2017 6:19 AM

## 2017-08-10 NOTE — Progress Notes (Signed)
INTERIM PROGRESS  Patient has been demanding to leave for a flight by 9am because she doesn't want to be billed for a flight change.  3 members of our team have shared with her our concerns that this is unsafe due to her immunocompromised state and new o2 requirement in setting of pneumonia on IV abx.  She agree that she will stay if we can convince the airline to not charge her, I have called the airline and there is a 32min estimated wait for callback to my cellphone.  I will fax whatever documentation they need to assist in a free change of flight.  -Dr. Criss Rosales

## 2017-08-10 NOTE — Progress Notes (Signed)
Received a call from nurse the patient was threatening to take some home medication in the room.  Saw patient at bedside and confirmed the patient had not taken any medication that was not given to her by the staff.  Patient had questions about her albuterol and Lasix which were addressed.  Patient related that her nephrologist wanted a Prograf level drawn.  Appropriate order was placed.  Patient had additional questions about colchicine and pain control which were also addressed.  Patient was on 0.5 L nasal cannula upon entering the room.  This was put to 0 for the interview.  Patient had no trouble with shortness of breath during interview and nasal cannula was removed.  The gout exacerbation of patient's left first toe was assessed.  The PIP of patient's first toe appeared to be slightly swollen without significant warmth.  The inferior aspect of the joint was exquisitely tender to palpation.  No changes were made to patient's medication at this time.

## 2017-08-11 DIAGNOSIS — N179 Acute kidney failure, unspecified: Secondary | ICD-10-CM

## 2017-08-11 LAB — BASIC METABOLIC PANEL
Anion gap: 9 (ref 5–15)
BUN: 28 mg/dL — ABNORMAL HIGH (ref 6–20)
CO2: 19 mmol/L — ABNORMAL LOW (ref 22–32)
Calcium: 8.9 mg/dL (ref 8.9–10.3)
Chloride: 107 mmol/L (ref 98–111)
Creatinine, Ser: 2.65 mg/dL — ABNORMAL HIGH (ref 0.44–1.00)
GFR calc Af Amer: 23 mL/min — ABNORMAL LOW (ref >60)
GFR calc non Af Amer: 20 mL/min — ABNORMAL LOW (ref >60)
Glucose, Bld: 112 mg/dL — ABNORMAL HIGH (ref 70–99)
Potassium: 5.1 mmol/L (ref 3.5–5.1)
Sodium: 135 mmol/L (ref 135–145)

## 2017-08-11 LAB — CBC
HCT: 24.5 % — ABNORMAL LOW (ref 36.0–46.0)
Hemoglobin: 7.7 g/dL — ABNORMAL LOW (ref 12.0–15.0)
MCH: 30.8 pg (ref 26.0–34.0)
MCHC: 31.4 g/dL (ref 30.0–36.0)
MCV: 98 fL (ref 78.0–100.0)
Platelets: 242 10*3/uL (ref 150–400)
RBC: 2.5 MIL/uL — ABNORMAL LOW (ref 3.87–5.11)
RDW: 21.6 % — ABNORMAL HIGH (ref 11.5–15.5)
WBC: 5.2 10*3/uL (ref 4.0–10.5)

## 2017-08-11 MED ORDER — CLOTRIMAZOLE 10 MG MT TROC
10.0000 mg | Freq: Every day | OROMUCOSAL | Status: DC
  Administered 2017-08-11 – 2017-08-17 (×31): 10 mg via ORAL
  Filled 2017-08-11 (×35): qty 1

## 2017-08-11 MED ORDER — DIPHENHYDRAMINE HCL 25 MG PO CAPS
25.0000 mg | ORAL_CAPSULE | Freq: Four times a day (QID) | ORAL | Status: DC | PRN
  Administered 2017-08-11 – 2017-08-17 (×21): 25 mg via ORAL
  Filled 2017-08-11 (×22): qty 1

## 2017-08-11 MED ORDER — WHITE PETROLATUM EX OINT
TOPICAL_OINTMENT | CUTANEOUS | Status: AC
  Administered 2017-08-11: 02:00:00
  Filled 2017-08-11: qty 28.35

## 2017-08-11 NOTE — Progress Notes (Signed)
Patient blood pressure 82/46 tonight asymptomatic.Patient requesting prn dose of oxycodone for back pain.This nurse spoke with Dr. Pilar Plate aware of low blood pressure and okay with patient receiving oxycodone for pain.Will administer pain medication and continue to monitor patient.

## 2017-08-11 NOTE — Progress Notes (Signed)
In patient room and patient asking about taking lasix tonight.This nurse explained to patient that lasix is not ordered for her to take tonight and that when admitted to hospital sometimes medications are held for different reasons.Patient then stated," I have my lasix here .I"ll just take it." Explained  to patient that she should not be taking any medications that have not been given to her by staff.That she could be double dosing on medications and that some of her medications are currently being held.This nurse asked if home medications could be sent to pharmacy until discharge patient refused.Patient asking to talk to MD about her medications.Called Dr.Frank and he will come to bedside to talk with patient.

## 2017-08-11 NOTE — Progress Notes (Signed)
Patient requesting benadryl for itching no rash seen.Dr.Frank notified.New orders received and carried out.

## 2017-08-11 NOTE — Progress Notes (Signed)
Family Medicine Teaching Service Daily Progress Note Intern Pager: [phone]  Patient name: Tina Watkins Medical record number: [id number] Date of birth: 1965/04/24 Age: 52 y.o. Gender: female  Primary Care Provider: Donato Heinz, MD Consultants:  Code Status: full  Pt Overview and Major Events to Date:  7/8 admitted  Assessment and Plan: NAKIAH Watkins is a 52 y.o. female presenting with fevers, cough, and abdominal pain. PMH is significant for kidney transplant, gastroparesis, chronic pain syndrome, gout, depression, anxiety, tobacco use  CAP- on 2L this AM for comfort, not an O2 requirement as satting well, hypotension is chronic (patient reports baseline low blood pressure which appears true per chart review).  Chest x-ray in the ED with retrocardiac consolidation.  CT chest significant for multilobar pneumonia and interstitial edema primarily in the right lobe.  Lactate in the ED of 1.9 and white blood cell count of 6.6 improving to 5.4 am 7/9.  Of note patient is a post transplant patient and is on chronic immunosuppression, which could account for her normal WBC in the presence of infection.  Given NS boluses totaling 1.75 L.  Although patient has had multiple ED visits in the past 90 days, she has not been admitted and therefore does not qualify for HCAP.  HIV neg. - vanc/zosyn per pharm  -prednisone 50x5days (7/9- ) then back to home 5mg  daily -f/u blood cx (neg x<24hrs) -Monitor vitals -Up with assistance - albuterol PRN for SOB -Legionella  -TB injection -Strep pneumonia urinary antigen - wean O2 as tolerated   Chronic pain- chronic, unchanged,  She is not a good candidate for NSAIDs due to her single functioning kidney and chronic kidney disease. -returned to home oxy 10q6 -tylenol q6  Gastroparesis-stable Patient has a history of gastroparesis since 2010. Currently stable.  Home medications include metoclopramide 5 mg twice daily. -Continue  Reglan  ?AKI on ESRD status post kidney transplant: resolved to baseline 2.65 (baseline appears ~2.5) Patient has a history of ESRD with first renal transplant in 1989.  Patient is now on her third transplanted kidney.  Last transplant placed in 2005.  Patient reports no issues with current kidney.  Patient reports the baseline creatinine is around 2.0, although it ranges from 2-3.  Patient's home medications include prednisone, tacrolimus, azathioprine and furosemide. -hold lasix, will consider restarting if hypervolemic -tacrolims level pending (per coldanato) - avoid nephrotoxic agents - avoid NSAIDs - monitor BMP; patient's creatinine will hopefully decrease d/t fluid administration  Anemia- down to 7.1 7/9- 7/10 am labs pending Admission labs significant for hemoglobin of 8.4.  Baseline appears to be around 9.5.  Patient has a known history of anemia of chronic disease likely secondary to ESRD. -We will continue to monitor, will likely transfuse if continues to drop  Thrush: requested clotrimazole troche -clitrimazole troche  Tobacco use: smokes one half pack per day.  Declines nicotine patch  GERD Patient takes omeprazole at home. -Continue as pantoprazole  Gout-stable am 7/10 Patient takes colchicine PRN at home.  She feels like she might be getting a gout flair currently. - d/c colchicine  FEN/GI: renal diet Prophylaxis: heparin, aspirin  Disposition: stable  Subjective:  Feels she is much improved but still wanted oxygen for subjective SOB.  Objective: Temp:  [97.7 F (36.5 C)-98.6 F (37 C)] 97.9 F (36.6 C) (07/10 0433) Pulse Rate:  [57-64] 57 (07/10 0435) Resp:  [16-22] 20 (07/10 0433) BP: (82-91)/(46-64) 90/64 (07/10 0435) SpO2:  [97 %-100 %] 100 % (07/10 0433) Weight:  [  110 lb 10.7 oz (50.2 kg)] 110 lb 10.7 oz (50.2 kg) (07/09 2100) Physical Exam: General:improved from 7/9, still on 2L [place] (comfort, not required) Mouth: thrush on tongue, no lesions  noted Cardiovascular: RRR, 2/6 murmur Respiratory: improved lower lungs, IWB on 2L [place], no cough during exam Abdomen: soft belly with no tenderness claimed to exam Extremities: no pitting edema on exam, complains of pain (chronic) when sitting up/moving but not changed from baseline  Laboratory: Recent Labs  Lab 08/06/17 0933 08/09/17 1452 08/10/17 0417  WBC  --  6.6 5.4  HGB 9.6* 8.4* 7.1*  HCT  --  26.2* 23.0*  PLT  --  240 224   Recent Labs  Lab 08/06/17 0925  08/09/17 1612 08/10/17 0417 08/11/17 0531  NA 139   < > 137 136 135  K 4.7   < > 4.3 4.3 5.1  CL 108   < > 102 106 107  CO2 20*   < > 23 16* 19*  BUN 19   < > 25* 24* 28*  CREATININE 2.22*   < > 2.95* 2.57* 2.65*  CALCIUM 9.2   < > 9.5 8.5* 8.9  PROT 6.9  --  7.2  --   --   BILITOT 0.9  --  0.7  --   --   ALKPHOS 118  --  123  --   --   ALT 8  --  8  --   --   AST 12*  --  12*  --   --   GLUCOSE 103*   < > 95 118* 112*   < > = values in this interval not displayed.    Imaging/Diagnostic Tests: Ct Abdomen Pelvis Wo Contrast  Result Date: 08/09/2017 CLINICAL DATA:  Abdominal pain with diverticulitis suspected. Hypotension. Fever for 1 week EXAM: CT ABDOMEN AND PELVIS WITHOUT CONTRAST TECHNIQUE: Multidetector CT imaging of the abdomen and pelvis was performed following the standard protocol without IV contrast. COMPARISON:  Chest x-ray earlier today FINDINGS: Lower chest: Airspace disease in the right middle and left lower lobes. Interlobular septal thickening at the right base where there is also ground-glass opacity. Posterior mediastinal varices. Hepatobiliary: No focal liver abnormality.No evidence of biliary obstruction or stone. Pancreas: Unremarkable. Spleen: Unremarkable. Adrenals/Urinary Tract: Left adrenal is difficult to visualize. No adrenal abnormality seen. Bilateral native nephrectomy. Transplant kidney in the right lower quadrant without hydronephrosis or collection. Prior failed transplant in the right  lower quadrant with dense calcification. Unremarkable bladder. Stomach/Bowel:  No obstruction. No visible inflammation Vascular/Lymphatic: No acute vascular abnormality. No mass or adenopathy. Venous collaterals in the abdominal wall where there is also postoperative distortion inferiorly. Reproductive:Negative Other: No ascites or pneumoperitoneum. Musculoskeletal: Coarsened trabecular markings compatible with end-stage renal disease. IMPRESSION: 1. Multi lobar pneumonia. 2. Interstitial edema asymmetric to the right lower lobe. 3. No acute finding in the abdomen. 4. Transplant kidneys in the right lower quadrant, one failed and the other with unremarkable appearance. Electronically Signed   By: Monte Fantasia M.D.   On: 08/09/2017 20:49   Dg Chest 2 View  Result Date: 08/09/2017 CLINICAL DATA:  Left-sided chest pain with shortness of breath, cough, and fever. Recent kidney transplant 1 week ago. EXAM: CHEST - 2 VIEW COMPARISON:  Chest x-ray dated July 18, 2017. FINDINGS: Stable mild cardiomegaly with unchanged SVC stent. Normal pulmonary vascularity. Unchanged scarring in the right middle lobe. No focal consolidation, pleural effusion, or pneumothorax. No acute osseous abnormality. IMPRESSION: No active cardiopulmonary disease. Electronically  Signed   By: Titus Dubin M.D.   On: 08/09/2017 17:01   Sherene Sires, DO 08/11/2017, 7:49 AM PGY-2, [place] Intern pager: [phone], text pages welcome

## 2017-08-11 NOTE — Progress Notes (Signed)
Pharmacy - Home medications being stored in the room  Spoke with patient re: home medications being stored in the room. Explained hospital policy and offered to store medications in pharmacy - patient refused but confirmed she will not use home supply. She has no one to take them home.   Renold Genta, PharmD, BCPS Clinical Pharmacist Clinical phone for 08/11/2017 until 3p is [phone] Please check AMION for all Pharmacist numbers by unit 08/11/2017 10:29 AM

## 2017-08-11 NOTE — Evaluation (Signed)
Occupational Therapy Evaluation Patient Details Name: LADELLE TEODORO MRN: [id number] DOB: 01-Apr-1965 Today's Date: 08/11/2017    History of Present Illness Pt is a 52 y.o. F wih significant PMH of kidney transplant, gastroparesis, chronic pain syndrome, gout, depression, anxiety, tobacco use. Presenting with fevers, cough, and abdominal pain. CT chest significant for multilobar pneumonia and interstitial edema primarily in the right lobe.    Clinical Impression   Pt with decline in function and safety with ADLs and ADL mobility with decreased strength, balance and endurance. Pt reports SOB upon any exertion, however O2 SATs 98-100% on RA throughout activity. Pt required increased time to complete tasks and ADL mobility due to c/o gout pain and fatigue. Pt would benefit from acute OT services to address impairments to maximize level of function and safety    Follow Up Recommendations  Home health OT;Other (comment)(HH aide)    Equipment Recommendations  Tub/shower seat    Recommendations for Other Services       Precautions / Restrictions Precautions Precautions: Fall Restrictions Weight Bearing Restrictions: No      Mobility Bed Mobility Overal bed mobility: Independent                Transfers Overall transfer level: Independent                    Balance Overall balance assessment: Mild deficits observed, not formally tested                                         ADL either performed or assessed with clinical judgement   ADL Overall ADL's : Needs assistance/impaired Eating/Feeding: Independent;Sitting;Bed level   Grooming: Wash/dry hands;Wash/dry face;Min guard;Supervision/safety   Upper Body Bathing: Set up   Lower Body Bathing: Min guard   Upper Body Dressing : Set up   Lower Body Dressing: Min guard   Toilet Transfer: Supervision/safety;Ambulation   Toileting- Clothing Manipulation and Hygiene: Supervision/safety    Tub/ Shower Transfer: Supervision/safety   Functional mobility during ADLs: Supervision/safety General ADL Comments: min guard A - sup with ADLs/selfcare due to mild balance deficits, low endruance. Pt states that she has SOB, however O2 SATs 98-100% throughout acitivty on RA     Vision Baseline Vision/History: Wears glasses Wears Glasses: Reading only Patient Visual Report: No change from baseline       Perception     Praxis      Pertinent Vitals/Pain Pain Score: 6  Pain Location: Chronic upper back pain, L foot pain from gout Pain Descriptors / Indicators: Aching;Sore Pain Intervention(s): Limited activity within patient's tolerance;Monitored during session;Repositioned     Hand Dominance Right   Extremity/Trunk Assessment Upper Extremity Assessment Upper Extremity Assessment: Difficult to assess due to impaired cognition   Lower Extremity Assessment Lower Extremity Assessment: Defer to PT evaluation   Cervical / Trunk Assessment Cervical / Trunk Assessment: Normal   Communication Communication Communication: No difficulties   Cognition Arousal/Alertness: Awake/alert Behavior During Therapy: WFL for tasks assessed/performed Overall Cognitive Status: Within Functional Limits for tasks assessed                                     General Comments       Exercises     Shoulder Instructions      Home Living Family/patient expects to  be discharged to:: Private residence Living Arrangements: Alone Available Help at Discharge: Other (Comment)(none) Type of Home: Apartment Home Access: Stairs to enter Entrance Stairs-Number of Steps: 6 Entrance Stairs-Rails: Can reach both Home Layout: One level     Bathroom Shower/Tub: Walk-in shower         Home Equipment: Environmental consultant - 2 wheels;Cane - single point          Prior Functioning/Environment Level of Independence: Independent        Comments: Independent with mobility and ADL's, however,  patient does report she has increased difficulty with performing IADL's i.e. cooking, cleaning. States she mainly sponge bathes to conserve energy.        OT Problem List: Decreased strength;Decreased activity tolerance;Decreased knowledge of use of DME or AE;Pain;Decreased coordination;Impaired balance (sitting and/or standing)      OT Treatment/Interventions: Self-care/ADL training;Energy conservation;Therapeutic exercise;DME and/or AE instruction;Therapeutic activities;Patient/family education    OT Goals(Current goals can be found in the care plan section) Acute Rehab OT Goals Patient Stated Goal: get better and go home OT Goal Formulation: With patient Time For Goal Achievement: 08/25/17 Potential to Achieve Goals: Good ADL Goals Pt Will Perform Grooming: with set-up;standing Pt Will Perform Lower Body Bathing: with supervision;with set-up;with modified independence;sitting/lateral leans;sit to/from stand Pt Will Perform Lower Body Dressing: with supervision;with set-up;with modified independence;sitting/lateral leans;sit to/from stand Pt Will Transfer to Toilet: Independently Pt Will Perform Tub/Shower Transfer: Independently;Shower transfer;ambulating Additional ADL Goal #1: Pt will verbalize and demonstrate 3 energy conservation techniques during ADLs and ADL mobility  OT Frequency: Min 2X/week   Barriers to D/C: Decreased caregiver support          Co-evaluation              AM-PAC PT "6 Clicks" Daily Activity     Outcome Measure Help from another person eating meals?: None Help from another person taking care of personal grooming?: A Little Help from another person toileting, which includes using toliet, bedpan, or urinal?: A Little Help from another person bathing (including washing, rinsing, drying)?: A Little Help from another person to put on and taking off regular upper body clothing?: A Little Help from another person to put on and taking off regular lower  body clothing?: A Little 6 Click Score: 19   End of Session Equipment Utilized During Treatment: Gait belt  Activity Tolerance: Patient limited by fatigue Patient left: in bed;with call bell/phone within reach  OT Visit Diagnosis: Unsteadiness on feet (R26.81);Muscle weakness (generalized) (M62.81);Pain Pain - Right/Left: Right Pain - part of body: Ankle and joints of foot                Time: 0045-9977 OT Time Calculation (min): 42 min Charges:  OT General Charges $OT Visit: 1 Visit OT Evaluation $OT Eval Moderate Complexity: 1 Mod OT Treatments $Self Care/Home Management : 8-22 mins $Therapeutic Activity: 8-22 mins G-Codes: OT G-codes **NOT FOR INPATIENT CLASS** Functional Assessment Tool Used: AM-PAC 6 Clicks Daily Activity     Britt Bottom 08/11/2017, 1:52 PM

## 2017-08-11 NOTE — Progress Notes (Addendum)
Clarification.  Patient did have a mild AKI.  Unfortunately, she has not improved much with treatment.  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

## 2017-08-11 NOTE — Progress Notes (Signed)
Patient's home medications are at her bedside and refused to send them down to pharmacy.  Farley Ly RN

## 2017-08-11 NOTE — Progress Notes (Signed)
Patient was SOB and wanted to turn her oxygen up from 0.5 liters to 1.5 liters. Her PRN albuterol wasn't due at this time. Patient's oxygen sat is now 98% ON 1.5 Liters of O2. Will continue to monitor.   Farley Ly RN

## 2017-08-11 NOTE — Evaluation (Signed)
Physical Therapy Evaluation Patient Details Name: Tina Watkins MRN: [id number] DOB: December 23, 1965 Today's Date: 08/11/2017   History of Present Illness  Pt is a 52 y.o. F wih significant PMH of kidney transplant, gastroparesis, chronic pain syndrome, gout, depression, anxiety, tobacco use. Presenting with fevers, cough, and abdominal pain. CT chest significant for multilobar pneumonia and interstitial edema primarily in the right lobe.   Clinical Impression  Patient lives alone at baseline and is independent with mobility/ADL's. Currently presenting with decreased functional mobility secondary to chronic upper back pain and left foot pain from gout, functional strength deficits, diminished endurance, and decreased balance. Patient ambulating 40 feet with no assistive device and supervision; complaints of dyspnea but SpO2 remained 98% on RA. Educated on importance of mobilizing/sitting up in chair; patient declining to sit in recliner due to back pain but agreeable to sit on edge of bed. Will benefit from HHPT to address deficits and maximize functional independence. Will follow acutely to progress mobility.     Follow Up Recommendations Home health PT (could also benefit from [facility] aide several times a week to assist with ADL's/IADL's    Equipment Recommendations  None recommended by PT    Recommendations for Other Services       Precautions / Restrictions Precautions Precautions: Fall Restrictions Weight Bearing Restrictions: No      Mobility  Bed Mobility Overal bed mobility: Independent                Transfers Overall transfer level: Independent                  Ambulation/Gait Ambulation/Gait assistance: Supervision Gait Distance (Feet): 40 Feet Assistive device: None Gait Pattern/deviations: Step-through pattern;Decreased stride length;Decreased dorsiflexion - right;Decreased dorsiflexion - left Gait velocity: decreased Gait velocity interpretation: <1.31  ft/sec, indicative of household ambulator General Gait Details: Patient with very slow, but steady gait. Requiring several standing rest breaks due to perceived feeling of dyspnea, SpO2 98% on RA.  Stairs            Wheelchair Mobility    Modified Rankin (Stroke Patients Only)       Balance Overall balance assessment: Mild deficits observed, not formally tested                                           Pertinent Vitals/Pain Pain Assessment: Faces Faces Pain Scale: Hurts little more Pain Location: Chronic upper back pain, L foot pain from gout Pain Descriptors / Indicators: Aching Pain Intervention(s): Limited activity within patient's tolerance;Monitored during session    Home Living Family/patient expects to be discharged to:: Private residence Living Arrangements: Alone Available Help at Discharge: Other (Comment)(none)   Home Access: Stairs to enter Entrance Stairs-Rails: Can reach both Entrance Stairs-Number of Steps: 6 Home Layout: One level Home Equipment: Walker - 2 wheels;Cane - single point      Prior Function Level of Independence: Independent         Comments: Independent with mobility and ADL's, however, patient does report she has increased difficulty with performing IADL's i.e. cooking, cleaning. States she mainly sponge bathes to conserve energy.     Hand Dominance        Extremity/Trunk Assessment   Upper Extremity Assessment Upper Extremity Assessment: Defer to OT evaluation    Lower Extremity Assessment Lower Extremity Assessment: Generalized weakness    Cervical / Trunk Assessment Cervical /  Trunk Assessment: Normal  Communication   Communication: No difficulties  Cognition Arousal/Alertness: Awake/alert Behavior During Therapy: WFL for tasks assessed/performed Overall Cognitive Status: Within Functional Limits for tasks assessed                                        General Comments       Exercises     Assessment/Plan    PT Assessment Patient needs continued PT services  PT Problem List Decreased strength;Decreased activity tolerance;Decreased balance;Decreased mobility;Pain       PT Treatment Interventions DME instruction;Gait training;Stair training;Functional mobility training;Therapeutic activities;Therapeutic exercise;Balance training;Patient/family education    PT Goals (Current goals can be found in the Care Plan section)  Acute Rehab PT Goals Patient Stated Goal: Get PT to address her back pain PT Goal Formulation: With patient Time For Goal Achievement: 08/25/17 Potential to Achieve Goals: Good    Frequency Min 3X/week   Barriers to discharge        Co-evaluation               AM-PAC PT "6 Clicks" Daily Activity  Outcome Measure Difficulty turning over in bed (including adjusting bedclothes, sheets and blankets)?: None Difficulty moving from lying on back to sitting on the side of the bed? : None Difficulty sitting down on and standing up from a chair with arms (e.g., wheelchair, bedside commode, etc,.)?: None Help needed moving to and from a bed to chair (including a wheelchair)?: None Help needed walking in hospital room?: A Little Help needed climbing 3-5 steps with a railing? : A Little 6 Click Score: 22    End of Session Equipment Utilized During Treatment: Gait belt Activity Tolerance: Patient tolerated treatment well Patient left: in bed;with call bell/phone within reach Nurse Communication: Mobility status PT Visit Diagnosis: Unsteadiness on feet (R26.81);Muscle weakness (generalized) (M62.81);Difficulty in walking, not elsewhere classified (R26.2);Pain Pain - part of body: (back)    Time: 2952-8413 PT Time Calculation (min) (ACUTE ONLY): 23 min   Charges:   PT Evaluation $PT Eval Moderate Complexity: 1 Mod PT Treatments $Therapeutic Activity: 8-22 mins   PT G Codes:       Ellamae Sia, PT, DPT Acute Rehabilitation  Services  Pager: Miranda 08/11/2017, 8:58 AM

## 2017-08-12 ENCOUNTER — Inpatient Hospital Stay (HOSPITAL_COMMUNITY): Payer: Medicaid Other

## 2017-08-12 DIAGNOSIS — B370 Candidal stomatitis: Secondary | ICD-10-CM

## 2017-08-12 LAB — URINALYSIS, ROUTINE W REFLEX MICROSCOPIC
Bacteria, UA: NONE SEEN
Bilirubin Urine: NEGATIVE
Glucose, UA: NEGATIVE mg/dL
Hgb urine dipstick: NEGATIVE
Ketones, ur: 5 mg/dL — AB
Leukocytes, UA: NEGATIVE
Nitrite: NEGATIVE
Protein, ur: 30 mg/dL — AB
Specific Gravity, Urine: 1.02 (ref 1.005–1.030)
pH: 5 (ref 5.0–8.0)

## 2017-08-12 LAB — LEGIONELLA PNEUMOPHILA SEROGP 1 UR AG: L. pneumophila Serogp 1 Ur Ag: NEGATIVE

## 2017-08-12 LAB — BASIC METABOLIC PANEL
Anion gap: 14 (ref 5–15)
BUN: 38 mg/dL — ABNORMAL HIGH (ref 6–20)
CO2: 16 mmol/L — ABNORMAL LOW (ref 22–32)
Calcium: 8.8 mg/dL — ABNORMAL LOW (ref 8.9–10.3)
Chloride: 103 mmol/L (ref 98–111)
Creatinine, Ser: 3.89 mg/dL — ABNORMAL HIGH (ref 0.44–1.00)
GFR calc Af Amer: 15 mL/min — ABNORMAL LOW (ref >60)
GFR calc non Af Amer: 13 mL/min — ABNORMAL LOW (ref >60)
Glucose, Bld: 89 mg/dL (ref 70–99)
Potassium: 5.2 mmol/L — ABNORMAL HIGH (ref 3.5–5.1)
Sodium: 133 mmol/L — ABNORMAL LOW (ref 135–145)

## 2017-08-12 LAB — CBC
HCT: 24 % — ABNORMAL LOW (ref 36.0–46.0)
Hemoglobin: 7.3 g/dL — ABNORMAL LOW (ref 12.0–15.0)
MCH: 30 pg (ref 26.0–34.0)
MCHC: 30.4 g/dL (ref 30.0–36.0)
MCV: 98.8 fL (ref 78.0–100.0)
Platelets: 257 10*3/uL (ref 150–400)
RBC: 2.43 MIL/uL — ABNORMAL LOW (ref 3.87–5.11)
RDW: 21.7 % — ABNORMAL HIGH (ref 11.5–15.5)
WBC: 4.7 10*3/uL (ref 4.0–10.5)

## 2017-08-12 MED ORDER — MOXIFLOXACIN HCL 400 MG PO TABS
400.0000 mg | ORAL_TABLET | ORAL | Status: DC
  Administered 2017-08-12 – 2017-08-16 (×5): 400 mg via ORAL
  Filled 2017-08-12 (×6): qty 1

## 2017-08-12 MED ORDER — TACROLIMUS 1 MG PO CAPS
3.0000 mg | ORAL_CAPSULE | Freq: Every day | ORAL | Status: DC
  Administered 2017-08-13 – 2017-08-17 (×5): 3 mg via ORAL
  Filled 2017-08-12 (×7): qty 3

## 2017-08-12 MED ORDER — TACROLIMUS 1 MG PO CAPS
2.0000 mg | ORAL_CAPSULE | Freq: Every evening | ORAL | Status: DC
  Administered 2017-08-12 – 2017-08-16 (×5): 2 mg via ORAL
  Filled 2017-08-12 (×7): qty 2

## 2017-08-12 MED ORDER — NON FORMULARY: 400.0000 mg | Freq: Every morning | Status: DC

## 2017-08-12 MED ORDER — NICOTINE 14 MG/24HR TD PT24
14.0000 mg | MEDICATED_PATCH | Freq: Every day | TRANSDERMAL | Status: AC
  Administered 2017-08-12 – 2017-08-16 (×5): 14 mg via TRANSDERMAL
  Filled 2017-08-12 (×5): qty 1

## 2017-08-12 MED ORDER — STERILE WATER FOR INJECTION IV SOLN
INTRAVENOUS | Status: DC
  Administered 2017-08-12 – 2017-08-13 (×3): via INTRAVENOUS
  Filled 2017-08-12 (×6): qty 9.71

## 2017-08-12 NOTE — Progress Notes (Signed)
Family Medicine Teaching Service Daily Progress Note Intern Pager: [phone]  Patient name: Tina Watkins Medical record number: [id number] Date of birth: 1965-06-19 Age: 52 y.o. Gender: female  Primary Care Provider: Donato Heinz, MD Consultants: Nephro Code Status: full  Pt Overview and Major Events to Date:  7/8 admitted  Assessment and Plan: Tina Watkins a 52 y.o.femalepresenting with fevers, cough, and abdominal pain. PMH is significant forkidney transplant, gastroparesis,chronic pain syndrome,gout, depression, anxiety, tobacco use  CAP - Admit to inpatient, attending Dr. Ardelia Mems - d/c IV Vanc/Zosyn (7/11) - Moxifloxacin PO initiated (7/11)  -prednisone 50x5days (7/9-7/13) then back to home 5mg  daily - blood cx (neg at Day 3) -Monitor vitals -Up with assistance  ?AKI onESRD status post kidney transplant: worsening, 4.36 (7/12) (baseline appears ~2.5) Patient has a history of ESRD with first renal transplant in 1989. Patient is now on her third transplanted kidney. Last transplant placed in 2005. Patient reports no issues with current kidney. Patient reports the baseline creatinine is around 2.0, although it ranges from 2-3.Patient's home medicationsinclude prednisone, tacrolimus, azathioprineand furosemide. - Hold lasix, will consider restarting if hypervolemic - Tacrolimus level (7/10): pending (per coldanato) - avoid nephrotoxic agents - avoid NSAIDs - monitor BMP - Sodium-bicarb IVF - Adjust med doses per GFR  - Imuran, Prograf and Prednisone. She is currently on higher dose of prednisone for COPD.  - Nephro consulted - "AKI likely 2/2 hypotension, infxn, use of vancomycin; repeat UA with microscopy; does not believe to be acute rejection: Bladder scan, ultrasound transplant kidney, strict I&O"  Chronic pain- chronic, unchanged, She is not a good candidate for NSAIDs due to her single functioning kidney and chronic kidney  disease. -returned to home oxy 10q6 -tylenol q6  Gastroparesis-stable Patient has a history of gastroparesis since 2010.Currently stable. Home medications include metoclopramide 5 mg twice daily. 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 -We will continue to monitor, will likely transfuse if continues to drop  Thrush: requested clotrimazole -clitrimazole troche  Tobacco use: smokes one half pack per day. Declines nicotine patch  GERD Patient takes omeprazole at home. -Continue as pantoprazole  Gout-stable am 7/10 Patient takes colchicine PRN at home. She feels like she might be getting a gout flair currently. - d/c colchicine  FEN/GI:renal diet Prophylaxis:heparin, aspirin  Disposition:stable  Subjective:  Patient is seen this morning resting comfortably in bed without nasal cannula eating breakfast. She reports feeling itchy, but denies headaches, chest pain, nausea, vomiting, abdominal pain, and dysuria. She reports feeling better and was asking about some of her labs.   Objective: Temp:  [97.7 F (36.5 C)-97.8 F (36.6 C)] 97.8 F (36.6 C) (07/11 0417) Pulse Rate:  [59-67] 59 (07/11 0417) Resp:  [18] 18 (07/11 0417) BP: (91-98)/(47-62) 93/62 (07/11 0417) SpO2:  [100 %] 100 % (07/11 0417) Weight:  [115 lb 14.4 oz (52.6 kg)] 115 lb 14.4 oz (52.6 kg) (07/10 2122)  Physical Exam: General: improved from 7/9,  Mouth: thrush on tongue, no lesions noted Cardiovascular: RRR, 2/6 murmur Respiratory: improved lower lung sounds, [place] discontinued (7/12), no cough during exam Abdomen: soft belly without tenderness to palpation Extremities: no pitting edema on exam, pain is well controlled; podagra episode has resolved (7/10)  Laboratory: CBC Latest Ref Rng & Units 08/13/2017 08/12/2017 08/11/2017  WBC 4.0 - 10.5 K/uL 3.6(L) 4.7 5.2  Hemoglobin 12.0 - 15.0 g/dL 7.3(L) 7.3(L)  7.7(L)  Hematocrit  36.0 - 46.0 % 22.7(L) 24.0(L) 24.5(L)  Platelets 150 - 400 K/uL 270 257 242   BMP Latest Ref Rng & Units 08/13/2017 08/12/2017 08/11/2017  Glucose 70 - 99 mg/dL 88 89 112(H)  BUN 6 - 20 mg/dL 48(H) 38(H) 28(H)  Creatinine 0.44 - 1.00 mg/dL 4.36(H) 3.89(H) 2.65(H)  Sodium 135 - 145 mmol/L 131(L) 133(L) 135  Potassium 3.5 - 5.1 mmol/L 5.6(H) 5.2(H) 5.1  Chloride 98 - 111 mmol/L 101 103 107  CO2 22 - 32 mmol/L 18(L) 16(L) 19(L)  Calcium 8.9 - 10.3 mg/dL 8.2(L) 8.8(L) 8.9   Imaging/Diagnostic Tests: Ct Abdomen Pelvis Wo Contrast  Result Date: 08/09/2017 CLINICAL DATA:  Abdominal pain with diverticulitis suspected. Hypotension. Fever for 1 week EXAM: CT ABDOMEN AND PELVIS WITHOUT CONTRAST TECHNIQUE: Multidetector CT imaging of the abdomen and pelvis was performed following the standard protocol without IV contrast. COMPARISON:  Chest x-ray earlier today FINDINGS: Lower chest: Airspace disease in the right middle and left lower lobes. Interlobular septal thickening at the right base where there is also ground-glass opacity. Posterior mediastinal varices. Hepatobiliary: No focal liver abnormality.No evidence of biliary obstruction or stone. Pancreas: Unremarkable. Spleen: Unremarkable. Adrenals/Urinary Tract: Left adrenal is difficult to visualize. No adrenal abnormality seen. Bilateral native nephrectomy. Transplant kidney in the right lower quadrant without hydronephrosis or collection. Prior failed transplant in the right lower quadrant with dense calcification. Unremarkable bladder. Stomach/Bowel:  No obstruction. No visible inflammation Vascular/Lymphatic: No acute vascular abnormality. No mass or adenopathy. Venous collaterals in the abdominal wall where there is also postoperative distortion inferiorly. Reproductive:Negative Other: No ascites or pneumoperitoneum. Musculoskeletal: Coarsened trabecular markings compatible with end-stage renal disease. IMPRESSION: 1. Multi lobar  pneumonia. 2. Interstitial edema asymmetric to the right lower lobe. 3. No acute finding in the abdomen. 4. Transplant kidneys in the right lower quadrant, one failed and the other with unremarkable appearance. Electronically Signed   By: Monte Fantasia M.D.   On: 08/09/2017 20:49   Dg Chest 2 View  Result Date: 08/09/2017 CLINICAL DATA:  Left-sided chest pain with shortness of breath, cough, and fever. Recent kidney transplant 1 week ago. EXAM: CHEST - 2 VIEW COMPARISON:  Chest x-ray dated July 18, 2017. FINDINGS: Stable mild cardiomegaly with unchanged SVC stent. Normal pulmonary vascularity. Unchanged scarring in the right middle lobe. No focal consolidation, pleural effusion, or pneumothorax. No acute osseous abnormality. IMPRESSION: No active cardiopulmonary disease. Electronically Signed   By: Titus Dubin M.D.   On: 08/09/2017 17:01   Dg Chest 2 View  Result Date: 07/18/2017 CLINICAL DATA:  Cough and congestion. EXAM: CHEST - 2 VIEW COMPARISON:  06/05/2017 FINDINGS: Mild cardiac enlargement. A stent is identified within the superior vena cava. No pleural effusion or edema identified. No airspace opacities identified. IMPRESSION: 1. No acute cardiopulmonary abnormalities. Electronically Signed   By: Kerby Moors M.D.   On: 07/18/2017 16:56   Ct Chest Wo Contrast  Result Date: 08/10/2017 CLINICAL DATA:  52 year old with multiple medical problems including end-stage renal disease and prior renal transplantation, presenting now with sepsis, cough and hypoxia. CT abdomen and pelvis yesterday demonstrated multilobar pneumonia in the visualized lungs. Current smoker. EXAM: CT CHEST WITHOUT CONTRAST TECHNIQUE: Multidetector CT imaging of the chest was performed following the standard protocol without IV contrast. COMPARISON:  Visualized lung bases on CT abdomen and pelvis yesterday. CT chest 10/24/2015. FINDINGS: Cardiovascular: SVC stent unchanged in appearance since the 2017 CT. Markedly dilated  azygos and hemiazygous veins in the mediastinum, unchanged, likely indicating chronic SVC  occlusion. Heart size upper normal to slightly enlarged, unchanged. Mild LAD and RIGHT coronary artery distribution atherosclerosis. Small chronic pericardial effusion, unchanged. Moderate atherosclerosis involving the thoracic and proximal abdominal aorta without evidence of aneurysm. Mediastinum/Nodes: Scattered normal sized lymph nodes throughout the mediastinum, many of which are calcified, unchanged. No new or enlarging lymphadenopathy. Normal appearing esophagus. Normal-appearing thyroid gland. Lungs/Pleura: Confluent airspace consolidation with air bronchograms in the RIGHT middle lobe, RIGHT lower lobe and LEFT lower lobe as noted on yesterday's CT. BILATERAL UPPER lobes are clear. Central airways patent with marked bronchial wall thickening involving the RIGHT middle lobe and RIGHT lower lobe bronchi. Small BILATERAL pleural effusions. Calcified granuloma deep in the POSTERIOR RIGHT lower lobe as noted previously. Upper Abdomen: Calcified granulomata involving the liver and spleen. Surgical clips in the retroperitoneum from prior nephrectomy. Markedly dilated azygos and hemiazygous veins in the retrocrural space. Visualized upper abdomen otherwise unremarkable for the unenhanced technique. Musculoskeletal: Regional skeleton intact without acute or significant osseous abnormality. IMPRESSION: 1. Pneumonia involving the RIGHT middle lobe and BILATERAL lower lobes. 2. Associated small BILATERAL pleural effusions. 3. 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 4. Old granulomatous disease. Aortic Atherosclerosis (ICD10-I70.0). Electronically Signed   By: Evangeline Dakin M.D.   On: 08/10/2017 14:34   US Renal Transplant W/doppler  Result Date: 08/12/2017 CLINICAL DATA:  Acute renal injury.  Renal transplant. EXAM:  ULTRASOUND OF RENAL TRANSPLANT WITH RENAL DOPPLER ULTRASOUND TECHNIQUE: Ultrasound examination of the renal transplant was performed with gray-scale, color and duplex doppler evaluation. COMPARISON:  None. FINDINGS: Transplant kidney location: Right lower quadrant Transplant Kidney: Length: 11.2 cm. Normal in size and parenchymal echogenicity. No evidence of mass or hydronephrosis. No peri-transplant fluid collection seen. Color flow in the main renal artery:  Yes Color flow in the main renal vein:  Yes Duplex Doppler Evaluation: Main Renal Artery Resistive Index: 0.65 Venous waveform in main renal vein:  Present Intrarenal resistive index in upper pole:  0.58 (normal 0.6-0.8; equivocal 0.8-0.9; abnormal >= 0.9) Intrarenal resistive index in lower pole: 0.61 (normal 0.6-0.8; equivocal 0.8-0.9; abnormal >= 0.9) Bladder: Normal for degree of bladder distention. Other findings:  Right-sided ureteral jet is demonstrated. IMPRESSION: Normal resistive indices for the transplanted right lower quadrant kidney. Electronically Signed   By: Ashley Royalty M.D.   On: 08/12/2017 20:26    Milus Banister, [place], PGY-1 08/13/2017 8:37 AM FPTS Intern pager: [phone], text pages welcome

## 2017-08-12 NOTE — Progress Notes (Signed)
Occupational Therapy Treatment Patient Details Name: Tina Watkins MRN: [id number] DOB: 15-Oct-1965 Today's Date: 08/12/2017    History of present illness Pt is a 52 y.o. F wih significant PMH of kidney transplant, gastroparesis, chronic pain syndrome, gout, depression, anxiety, tobacco use. Presenting with fevers, cough, and abdominal pain. CT chest significant for multilobar pneumonia and interstitial edema primarily in the right lobe.    OT comments  Pt making god progress. Able to ambulate @ 100 ft with 3 rest breaks with SpO2 @ 95; HR 64. Pt reports feeling SOB with ambulation. Educated pt on energy conservation strategies for ADL. Handout provided and reviewed. Pt asking about HH Aide to assist after DC - pt educated on need to discuss this with her primary care physician- CM made aware. Will continue to follow acutely.  Follow Up Recommendations  Home health OT;Other (comment)(HH Aide)    Equipment Recommendations  Tub/shower seat    Recommendations for Other Services      Precautions / Restrictions Precautions Precautions: Fall       Mobility Bed Mobility Overal bed mobility: Independent                Transfers Overall transfer level: Independent                    Balance Overall balance assessment: Mild deficits observed, not formally tested                                         ADL either performed or assessed with clinical judgement   ADL                                       Functional mobility during ADLs: Supervision/safety General ADL Comments: Educated pton energy conservation strategies for ADL. Recommend that pt have stool in kitchen to sit while doing dishes and preparing food as she states this "wears her out". Reports difficulty with IADL tasks. REcommend pt use shower seat when bathing. Pt verbalized understanding.      Vision   Additional Comments: blind R eye   Perception     Praxis       Cognition Arousal/Alertness: Awake/alert Behavior During Therapy: WFL for tasks assessed/performed Overall Cognitive Status: Within Functional Limits for tasks assessed                                          Exercises     Shoulder Instructions       General Comments  Discussed recommendation of smoking cessation. Pt reports she is going to try "the patch"    Pertinent Vitals/ Pain       Pain Assessment: Faces Faces Pain Scale: Hurts a little bit Pain Location: Chronic upper back pain, L foot pain from gout Pain Descriptors / Indicators: Aching;Sore Pain Intervention(s): Limited activity within patient's tolerance  Home Living                                          Prior Functioning/Environment              Frequency  Min 2X/week        Progress Toward Goals  OT Goals(current goals can now be found in the care plan section)  Progress towards OT goals: Progressing toward goals  Acute Rehab OT Goals Patient Stated Goal: get better and go home OT Goal Formulation: With patient Time For Goal Achievement: 08/25/17 Potential to Achieve Goals: Good ADL Goals Pt Will Perform Grooming: with set-up;standing Pt Will Perform Lower Body Bathing: with supervision;with set-up;with modified independence;sitting/lateral leans;sit to/from stand Pt Will Perform Lower Body Dressing: with supervision;with set-up;with modified independence;sitting/lateral leans;sit to/from stand Pt Will Transfer to Toilet: Independently Pt Will Perform Tub/Shower Transfer: Independently;Shower transfer;ambulating Additional ADL Goal #1: Pt will verbalize and demonstrate 3 energy conservation techniques during ADLs and ADL mobility  Plan Discharge plan remains appropriate    Co-evaluation                 AM-PAC PT "6 Clicks" Daily Activity     Outcome Measure   Help from another person eating meals?: None Help from another person taking care of  personal grooming?: None Help from another person toileting, which includes using toliet, bedpan, or urinal?: None Help from another person bathing (including washing, rinsing, drying)?: None Help from another person to put on and taking off regular upper body clothing?: None Help from another person to put on and taking off regular lower body clothing?: None 6 Click Score: 24    End of Session Equipment Utilized During Treatment: Gait belt  OT Visit Diagnosis: Unsteadiness on feet (R26.81);Muscle weakness (generalized) (M62.81);Pain Pain - Right/Left: Right Pain - part of body: Ankle and joints of foot   Activity Tolerance Patient tolerated treatment well   Patient Left in bed;with call bell/phone within reach;with bed alarm set   Nurse Communication Mobility status        Time: 9741-6384 OT Time Calculation (min): 17 min  Charges: OT General Charges $OT Visit: 1 Visit OT Treatments $Self Care/Home Management : 8-22 mins  Maurie Boettcher, OT/L  OT Clinical Specialist [phone]    [facility] 08/12/2017, 12:16 PM

## 2017-08-12 NOTE — Consult Note (Addendum)
[facility] Nephrology Consultation Note  Requesting MD: Dr. Andria Frames Reason for consult: AKI  HPI:  Tina Watkins is a 52 y.o. female. 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 Marval Regal at [facility], admitted with fever and cough who is seen in consultation at the request of Dr. Andria Frames for the evaluation and management of worsening renal failure. The serum creatine level was 2.85 on admission which is gradually worsened to 3.89 today. She was treated with IV vancomycin and zosyn for multifocal pneumonia. She received vancomycin on 7/8, 9 and 10th. She reported dry mouth and decreased po intake. No dysuria, urgency or frequency. No diarrhea. No pain or tenderness at the site of kidney transplant.  She received IVF during admission.  CXR and CT scan chest showed multifocal pneumonia.  She hasn't received IV contrast and no use of NSAIDs.  She reported feeling better today. No chest pain, sob, cough. Using oxygen.  Creatinine, Ser  Date/Time Value Ref Range Status  08/12/2017 05:27 AM 3.89 (H) 0.44 - 1.00 mg/dL Final    Comment:    DELTA CHECK NOTED  08/11/2017 05:31 AM 2.65 (H) 0.44 - 1.00 mg/dL Final  08/10/2017 04:17 AM 2.57 (H) 0.44 - 1.00 mg/dL Final  08/09/2017 04:12 PM 2.95 (H) 0.44 - 1.00 mg/dL Final  08/09/2017 02:52 PM 2.85 (H) 0.44 - 1.00 mg/dL Final  08/06/2017 09:25 AM 2.22 (H) 0.44 - 1.00 mg/dL Final  07/18/2017 04:34 PM 2.09 (H) 0.44 - 1.00 mg/dL Final  06/05/2017 04:08 AM 2.18 (H) 0.44 - 1.00 mg/dL Final  05/14/2017 10:30 AM 2.06 (H) 0.44 - 1.00 mg/dL Final  04/02/2017 10:00 AM 1.96 (H) 0.44 - 1.00 mg/dL Final  03/05/2017 11:00 AM 2.07 (H) 0.44 - 1.00 mg/dL Final  02/05/2017 09:54 AM 2.15 (H) 0.44 - 1.00 mg/dL Final  01/08/2017 10:45 AM  2.47 (H) 0.44 - 1.00 mg/dL Final  12/04/2016 11:07 AM 2.21 (H) 0.44 - 1.00 mg/dL Final  11/06/2016 10:15 AM 2.46 (H) 0.44 - 1.00 mg/dL Final  10/09/2016 09:29 AM 2.59 (H) 0.44 - 1.00 mg/dL Final  09/04/2016 10:34 AM 2.76 (H) 0.44 - 1.00 mg/dL Final  07/03/2016 09:47 AM 2.20 (H) 0.44 - 1.00 mg/dL Final  06/05/2016 10:35 AM 2.53 (H) 0.44 - 1.00 mg/dL Final  05/08/2016 10:00 AM 2.34 (H) 0.44 - 1.00 mg/dL Final  03/11/2016 09:36 AM 1.99 (H) 0.44 - 1.00 mg/dL Final  02/07/2016 09:23 AM 2.16 (H) 0.44 - 1.00 mg/dL Final  12/16/2015 10:50 AM 2.06 (H) 0.44 - 1.00 mg/dL Final  11/08/2015 09:30 AM 2.06 (H) 0.44 - 1.00 mg/dL Final  10/23/2015 08:01 PM 2.30 (H) 0.44 - 1.00 mg/dL Final  10/03/2015 02:37 AM 3.05 (H) 0.44 - 1.00 mg/dL Final  10/02/2015 03:11 AM 3.16 (H) 0.44 - 1.00 mg/dL Final  10/01/2015 03:48 AM 2.74 (H) 0.44 - 1.00 mg/dL Final  09/30/2015 05:18 AM 3.24 (H) 0.44 - 1.00 mg/dL Final  09/29/2015 04:48 PM 3.41 (H) 0.44 - 1.00 mg/dL Final  09/25/2015 07:43 PM 2.56 (H) 0.44 - 1.00 mg/dL Final  09/02/2015 09:59 AM 2.66 (H) 0.44 - 1.00 mg/dL Final  08/09/2015 09:45 AM 3.72 (H) 0.44 - 1.00 mg/dL Final  07/05/2015 09:43 AM 2.00 (H) 0.44 - 1.00 mg/dL Final  06/17/2015 06:34 PM 2.19 (H) 0.44 - 1.00 mg/dL Final  05/20/2015 10:17 AM 2.04 (H) 0.44 - 1.00 mg/dL Final  05/12/2015 08:19 PM 2.39 (H) 0.44 - 1.00 mg/dL Final  05/10/2015 10:32 PM 1.98 (H) 0.44 - 1.00 mg/dL Final  04/05/2015 09:40 AM 2.39 (H) 0.44 - 1.00 mg/dL Final  02/11/2015 01:55 PM 2.19 (H) 0.44 - 1.00 mg/dL Final  01/08/2015 07:29 AM 2.64 (H) 0.44 - 1.00 mg/dL Final  01/03/2015 10:15 AM 2.23 (H) 0.44 - 1.00 mg/dL Final  12/07/2014 10:26 AM 2.85 (H) 0.44 - 1.00 mg/dL Final  11/09/2014 09:29 AM 2.17 (H) 0.44 - 1.00 mg/dL Final  10/15/2014 08:04 AM 2.11 (H) 0.44 - 1.00 mg/dL Final  10/05/2014 09:48 AM 2.38 (H) 0.44 - 1.00 mg/dL Final  09/07/2014 09:31 AM 2.48 (H) 0.44 - 1.00 mg/dL Final  07/30/2014 09:26 AM 2.40 (H) 0.44 - 1.00  mg/dL Final  07/06/2014 09:25 AM 2.18 (H) 0.44 - 1.00 mg/dL Final  06/15/2014 04:30 AM 2.68 (H) 0.44 - 1.00 mg/dL Final  06/14/2014 05:38 AM 2.53 (H) 0.44 - 1.00 mg/dL Final  06/13/2014 12:35 AM 3.01 (H) 0.44 - 1.00 mg/dL Final     PMHx:   Past Medical History:  Diagnosis Date  . Bacteremia due to Gram-negative bacteria 05/23/2011  . Blind right eye   . Chronic lower back pain   . Complication of anesthesia    "woke up during OR; I have an extremely high tolerance" (12/11/2011) 1 procedure was graft; the other procedures were procedures that are typically done with sedation.  . DDD (degenerative disc disease), cervical   . Depression   . Dysrhythmia    "tachycardia" (12/11/2011) new onset afib 10/15/14 EKG  . E coli bacteremia 06/18/2011  . ESRD (end stage renal disease) ([place]) 06/12/2011  . Fibromyalgia   . Gastroparesis   . Gastroparesis   . GERD (gastroesophageal reflux disease)   . Glaucoma    right eye  . Gout   . H/O carpal tunnel syndrome   . Headache(784.0)    "not often anymore" (12/11/2011)  . Herpes genitalia 1994  . History of blood transfusion    "more than a few times" (12/11/2011)  . History of stomach ulcers   . Hypotension   . Iron deficiency anemia   . New onset a-fib ([place])    10/15/14 EKG  . Osteopenia   . Seizures ([place]) 1994   "post transplant; only have had that one" (12/11/2011)  . Spinal stenosis in cervical region   . Stroke [facility])     left basal ganglia lacunar infarct; Right frontal lobe lacunar infarct.  . Stroke [facility]) ~ 1999; 2001   "briefly lost my vision; lost my right eye" (12/11/2011)    Past Surgical History:  Procedure Laterality Date  . ANTERIOR CERVICAL DECOMP/DISCECTOMY FUSION N/A 01/08/2015   Procedure: Anterior Cervical Three-Four/Four-Five Decompression/Diskectomy/Fusion;  Surgeon: Leeroy Cha, MD;  Location: [facility] OR[facility];  Service: Neurosurgery;  Laterality: N/A;  C3-4 C4-5 Anterior cervical decompression/diskectomy/fusion  .  APPENDECTOMY  ~ 2004  . CATARACT EXTRACTION     right eye  . COLONOSCOPY    . ENUCLEATION  2001   "right"  . ESOPHAGOGASTRODUODENOSCOPY (EGD) WITH PROPOFOL N/A 04/21/2012   Procedure: ESOPHAGOGASTRODUODENOSCOPY (EGD) WITH PROPOFOL;  Surgeon: Milus Banister, MD;  Location: [facility] ENDOSCOPY;  Service: Endoscopy;  Laterality: N/A;  . 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 [place]; 1999; 2005   "right"  . MULTIPLE TOOTH  EXTRACTIONS    . TONSILLECTOMY    . TOTAL NEPHRECTOMY  1988?; 1994; 2005    Family Hx:  Family History  Problem Relation Age of Onset  . Glaucoma Mother   . Multiple sclerosis Brother   . Hypertension Maternal Grandmother   . Breast cancer Neg Hx     Social History:  reports that she has been smoking cigarettes.  She has a 5.60 pack-year smoking history. She has never used smokeless tobacco. She reports that she drinks alcohol. She reports that she has current or past drug history. Drug: Marijuana.  Allergies:  Allergies  Allergen Reactions  . Levofloxacin Itching and Rash    Medications: Prior to Admission medications   Medication Sig Start Date End Date Taking? Authorizing Provider  ALPRAZolam (XANAX) 0.25 MG tablet Take 0.25 mg by mouth daily as needed (FOR SVT).   Yes [provider]  aspirin EC 81 MG tablet Take 81 mg by mouth daily.    Yes [provider]  azaTHIOprine (IMURAN) 50 MG tablet Take 100 mg by mouth daily.    Yes [provider]  fluticasone (FLONASE) 50 MCG/ACT nasal spray Place 1 spray into both nostrils daily. 07/18/17  Yes Caccavale, Sophia, PA-C  furosemide (LASIX) 40 MG tablet Take 40 mg by mouth daily. 05/09/17  Yes [provider]  latanoprost (XALATAN) 0.005 % ophthalmic solution Place 1 drop into the left eye at bedtime. 03/30/17  Yes [provider]  metoCLOPramide (REGLAN) 5 MG tablet TAKE 1 TABLET BY MOUTH TWICE A  DAY AT 8AM AND 10 PM 08/06/17  Yes Milus Banister, MD  omeprazole (PRILOSEC) 40 MG capsule Take 40 mg by mouth 2 (two) times daily.    Yes [provider]  ondansetron (ZOFRAN ODT) 4 MG disintegrating tablet Take 1 tablet (4 mg total) by mouth every 6 (six) hours as needed. 06/05/17  Yes Ward, Delice Bison, DO  oxyCODONE-acetaminophen (PERCOCET) 10-325 MG tablet Take 1 tablet by mouth every 6 (six) hours as needed for pain.   Yes [provider]  predniSONE (DELTASONE) 5 MG tablet Take 5 mg by mouth daily. 06/22/11  Yes [name], MD  PROVENTIL HFA 108 [phone] Base) MCG/ACT inhaler Inhale 2 puffs into the lungs 4 (four) times daily as needed for shortness of breath. 08/04/17  Yes [provider]  tacrolimus (PROGRAF) 1 MG capsule Take 3 mg by mouth 2 (two) times daily.  06/22/11  Yes [name], MD  zolpidem (AMBIEN) 10 MG tablet Take 10 mg by mouth at bedtime as needed for sleep.   Yes [provider]  azithromycin (ZITHROMAX) 250 MG tablet Take 1 tablet (250 mg total) by mouth daily. Take first 2 tablets together, then 1 every day until finished. Patient not taking: Reported on 08/09/2017 06/05/17   Ward, Delice Bison, DO  metoCLOPramide (REGLAN) 5 MG tablet TAKE 1 TABLET BY MOUTH AT BEDTIME AND MAY REPEAT DOSE ONE TIME IF NEEDED Patient not taking: Reported on 06/05/2017 10/20/16   Milus Banister, MD  promethazine (PHENERGAN) 25 MG tablet Take 1 tablet (25 mg total) by mouth every 6 (six) hours as needed for nausea or vomiting. Patient not taking: Reported on 06/05/2017 06/18/15   Antonietta Breach, PA-C    I have reviewed the patient's current medications.  Labs:  Results for orders placed or performed during the hospital encounter of 08/09/17 (from the past 48 hour(s))  Basic metabolic panel     Status: Abnormal  Collection Time: 08/11/17  5:31 AM  Result Value Ref Range   Sodium 135 135 - 145 mmol/L   Potassium 5.1 3.5 - 5.1 mmol/L   Chloride 107 98 - 111  mmol/L    Comment: Please note change in reference range.   CO2 19 (L) 22 - 32 mmol/L   Glucose, Bld 112 (H) 70 - 99 mg/dL    Comment: Please note change in reference range.   BUN 28 (H) 6 - 20 mg/dL    Comment: Please note change in reference range.   Creatinine, Ser 2.65 (H) 0.44 - 1.00 mg/dL   Calcium 8.9 8.9 - 10.3 mg/dL   GFR calc non Af Amer 20 (L) >60 mL/min   GFR calc Af Amer 23 (L) >60 mL/min    Comment: (NOTE) The eGFR has been calculated using the CKD EPI equation. This calculation has not been validated in all clinical situations. eGFR's persistently <60 mL/min signify possible Chronic Kidney Disease.    Anion gap 9 5 - 15    Comment: Performed at [facility] [address], [place], [place] [address]  CBC     Status: Abnormal   Collection Time: 08/11/17 10:23 AM  Result Value Ref Range   WBC 5.2 4.0 - 10.5 K/uL   RBC 2.50 (L) 3.87 - 5.11 MIL/uL   Hemoglobin 7.7 (L) 12.0 - 15.0 g/dL   HCT 24.5 (L) 36.0 - 46.0 %   MCV 98.0 78.0 - 100.0 fL   MCH 30.8 26.0 - 34.0 pg   MCHC 31.4 30.0 - 36.0 g/dL   RDW 21.6 (H) 11.5 - 15.5 %   Platelets 242 150 - 400 K/uL    Comment: Performed at [facility], [place] [address], [place], [place] [address]  Basic metabolic panel     Status: Abnormal   Collection Time: 08/12/17  5:27 AM  Result Value Ref Range   Sodium 133 (L) 135 - 145 mmol/L   Potassium 5.2 (H) 3.5 - 5.1 mmol/L   Chloride 103 98 - 111 mmol/L    Comment: Please note change in reference range.   CO2 16 (L) 22 - 32 mmol/L   Glucose, Bld 89 70 - 99 mg/dL    Comment: Please note change in reference range.   BUN 38 (H) 6 - 20 mg/dL    Comment: Please note change in reference range.   Creatinine, Ser 3.89 (H) 0.44 - 1.00 mg/dL    Comment: DELTA CHECK NOTED   Calcium 8.8 (L) 8.9 - 10.3 mg/dL   GFR calc non Af Amer 13 (L) >60 mL/min   GFR calc Af Amer 15 (L) >60 mL/min    Comment: (NOTE) The eGFR has been calculated using the CKD EPI equation. This  calculation has not been validated in all clinical situations. eGFR's persistently <60 mL/min signify possible Chronic Kidney Disease.    Anion gap 14 5 - 15    Comment: Performed at [facility] [address] [place], [place] [address]  CBC     Status: Abnormal   Collection Time: 08/12/17  5:27 AM  Result Value Ref Range   WBC 4.7 4.0 - 10.5 K/uL   RBC 2.43 (L) 3.87 - 5.11 MIL/uL   Hemoglobin 7.3 (L) 12.0 - 15.0 g/dL   HCT 24.0 (L) 36.0 - 46.0 %   MCV 98.8 78.0 - 100.0 fL   MCH 30.0 26.0 - 34.0 pg   MCHC 30.4 30.0 - 36.0 g/dL  RDW 21.7 (H) 11.5 - 15.5 %   Platelets 257 150 - 400 K/uL    Comment: Performed at [facility], [place] [address], [place], [place] [address]     ROS:  Pertinent items are noted in HPI. Pertinent items noted in HPI and remainder of comprehensive ROS otherwise negative.  Physical Exam: Vitals:   08/12/17 0417 08/12/17 0914  BP: 93/62 97/62  Pulse: (!) 59 63  Resp: 18 18  Temp: 97.8 F (36.6 C) 98.2 F (36.8 C)  SpO2: 100% 100%     General exam: Appears calm and comfortable  Respiratory system: Clear to auscultation. Respiratory effort normal. No wheezing or crackle Cardiovascular system: S1 & S2 heard, RRR.  No pedal edema. Gastrointestinal system: Abdomen is nondistended, soft and nontender. Normal bowel sounds heard. Central nervous system: Alert and oriented. No focal neurological deficits. Extremities: Symmetric 5 x 5 power. Skin: No rashes, lesions or ulcers Psychiatry: Judgement and insight appear normal. Mood & affect appropriate.   Assessment/Plan:  # Acute kidney injury on CKD likely due to hypotension, infection (pneumonia) and the use of vancomycin: The urinalysis done on admission unremarkable, however no microscopy was done. I don't think this is acute rejection. -Repeat urinalysis with microscopy  -check bladder scan and [name] transplant kidney -strict I/O, no exact measurement of urine output done, d/w the patient.   -start sodium bicarbonate IVF (serum C02 level 16) -she is off of vancomycin now.  -Monitor BMP, avoid nephrotoxins. -please adjust the medication dose per GFR.  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 Continue home immunosuppressants including imuran 100 mg daily, prograf 3 mg in am and 2 mg qpm  and prednisone. She is currently on higher dose of prednisone for COPD. Follow up prograf level. -She follows with Dr. Littie Deeds at [facility].  # Anemia of chronic disease: The iron sat was 28 % and ferritin 1149 on 07/02/2017. She received procrit on 08/06/17 at Short Stay. No sign of infection. Low Hb likely due to infection ? BM suppression.   # Multifocal pneumonia: on Avelox. Off Vancomycin. Follow up culture results.  Thank you for the consult.   Dron Tanna Furry 08/12/2017, 1:31 PM  [employer].

## 2017-08-12 NOTE — Care Management Note (Addendum)
Case Management Note  Patient Details  Name: Tina Watkins MRN: [id number] Date of Birth: [birth year]  Subjective/Objective:                    Action/Plan: Discussed discharge planning with patient at bedside. Confirmed face sheet information.   Patient currently wearing oxygen does not have home oxygen . Patient requesting home oxygen. Explained home oxygen qualifying saturation. Patient also wanting personal care workers for home. Explained PCP will have to complete paperwork and submit to Medicaid.     Expected Discharge Date:                  Expected Discharge Plan:  [place]  In-House Referral:     Discharge planning Services  CM Consult  Post Acute Care Choice:  Durable Medical Equipment, Home Health Choice offered to:  Patient  DME Arranged:  Tub bench DME Agency:  [facility] Arranged:  PT, Nurse's Aide, sw HH Agency:  [facility]  Status of Service:  In process, will continue to follow  If discussed at Long Length of Stay Meetings, dates discussed:    Additional Comments:  Marilu Favre, RN 08/12/2017, 4:14 PM

## 2017-08-12 NOTE — Discharge Summary (Signed)
[facility] Discharge Summary  Patient name: Tina Watkins Medical record number: [id number] Date of birth: March 11, 1965 Age: 52 y.o. Gender: female Date of Admission: 08/09/2017  Date of Discharge: 08/17/2017 Admitting Physician: Zenia Resides, MD  Primary Care Provider: Donato Heinz, MD Consultants: Nephro  Indication for Hospitalization: Pneumonia, community acquired  Discharge Diagnoses/Problem List:  AKI on CKD History of HCAP COPD Gout Gastroparesis Anemia of Chronic Disease Tobacco Use GERD Chronic Pain  Disposition: Discharge to home  Discharge Condition: Stable  Discharge Exam:  Constitutional: She isoriented to person, place, and time. She appearswell-developedand well-nourished.  HENT:  Head:Normocephalicand atraumatic.  Eyes:EOMare normal.  Neck:Normal range of motion.Neck supple.  Cardiovascular:Normal rate,intact distal pulsesand normal pulses. Pulmonary/Chest:Effort normaland breath sounds normal. Noaccessory muscle usageor stridor.No tachypnea. Norespiratory distress.  Improved from previous exams Abdominal:Soft.Bowel sounds are normal.  Musculoskeletal:Normal range of motion.MTP pain approved from yesterday 7/16 Neurological: She isalertand oriented to person, place, and time.  Skin: Skin iswarmand dry. Capillary refill takesless than 2 seconds.  Psychiatric: She has anormal mood and affect.   Brief Hospital Course:  Tina Watkins is a 52 year old female with a history of PSGN, CKD, and Multiple Kidney Transplants who presented with pneumonia and shortness of breath. She was using oxygen for comfort and was started on IV Vancomycin. While the patient's pulmonary symptoms improved, she began having increased water retention and rising levels of creatinine, initiating an acute kidney injury. Nephro was consulted. Vancomycin was discontinued due to AKI on CKD, and patient was started on  Moxifloxacin for treatment of HCAP.  While treating her for pneumonia and AKI, the patient's gout flared, for which she was given steroids. The patient's potassium levels began to increase and were treated with kayexalate and bicarb per nephrology's guidance. The patient's creatinine (baseline 2-3) rose to 4.5 on 7/13, but slowly decreased down to 3.41 by 7/16. Her clinical picture continued to improve back to baseline and patient was discharged on 7/16 with orders for close follow-up, steroid taper back to baseline of 5mg  for COPD control, and creatinine monitoring.  Issues for Follow Up:  1. Recommend re-evaluating continued use of colchicine given tenuous renal status 2. Maintain close follow up with nephrologist for CKD. 3. Continue taking home meds as prescribed. 4. Avoid Vancomycin, NSAIDs, and other nephrotoxic medications to maintain kidney health.  Significant Procedures: None  Significant Labs and Imaging:  CBC Latest Ref Rng & Units 08/15/2017 08/14/2017 08/13/2017  WBC 4.0 - 10.5 K/uL 3.6(L) 3.7(L) 3.6(L)  Hemoglobin 12.0 - 15.0 g/dL 8.4(L) 8.1(L) 7.3(L)  Hematocrit 36.0 - 46.0 % 25.8(L) 25.6(L) 22.7(L)  Platelets 150 - 400 K/uL 273 292 270   CMP Latest Ref Rng & Units 08/17/2017 08/16/2017 08/15/2017  Glucose 70 - 99 mg/dL 112(H) 88 96  BUN 6 - 20 mg/dL 44(H) 49(H) 53(H)  Creatinine 0.44 - 1.00 mg/dL 3.41(H) 3.52(H) 3.74(H)  Sodium 135 - 145 mmol/L 139 141 137  Potassium 3.5 - 5.1 mmol/L 4.3 3.2(L) 4.0  Chloride 98 - 111 mmol/L 103 103 100  CO2 22 - 32 mmol/L 23 27 22   Calcium 8.9 - 10.3 mg/dL 8.5(L) 8.3(L) 8.2(L)  Total Protein 6.5 - 8.1 g/dL - - -  Total Bilirubin 0.3 - 1.2 mg/dL - - -  Alkaline Phos 38 - 126 U/L - - -  AST 15 - 41 U/L - - -  ALT 0 - 44 U/L - - -   Ct Abdomen Pelvis Wo Contrast: 08/09/2017  IMPRESSION: 1. Multi lobar  pneumonia. 2. Interstitial edema asymmetric to the right lower lobe. 3. No acute finding in the abdomen. 4. Transplant kidneys in the right  lower quadrant, one failed and the other with unremarkable appearance.  Dg Chest 2 View: 08/09/2017  IMPRESSION: No active cardiopulmonary disease. Electronically Signed   By: Titus Dubin M.D.   On: 08/09/2017 17:01   Ct Chest Wo Contrast  Result Date: 08/10/2017 CLINICAL DATA:  52 year old with multiple medical problems including end-stage renal disease and prior renal transplantation, presenting now with sepsis, cough and hypoxia. CT abdomen and pelvis yesterday demonstrated multilobar pneumonia in the visualized lungs. Current smoker. EXAM: CT CHEST WITHOUT CONTRAST TECHNIQUE: Multidetector CT imaging of the chest was performed following the standard protocol without IV contrast. COMPARISON:  Visualized lung bases on CT abdomen and pelvis yesterday. CT chest 10/24/2015. FINDINGS: Cardiovascular: SVC stent unchanged in appearance since the 2017 CT. Markedly dilated azygos and hemiazygous veins in the mediastinum, unchanged, likely indicating chronic SVC occlusion. Heart size upper normal to slightly enlarged, unchanged. Mild LAD and RIGHT coronary artery distribution atherosclerosis. Small chronic pericardial effusion, unchanged. Moderate atherosclerosis involving the thoracic and proximal abdominal aorta without evidence of aneurysm. Mediastinum/Nodes: Scattered normal sized lymph nodes throughout the mediastinum, many of which are calcified, unchanged. No new or enlarging lymphadenopathy. Normal appearing esophagus. Normal-appearing thyroid gland. Lungs/Pleura: Confluent airspace consolidation with air bronchograms in the RIGHT middle lobe, RIGHT lower lobe and LEFT lower lobe as noted on yesterday's CT. BILATERAL UPPER lobes are clear. Central airways patent with marked bronchial wall thickening involving the RIGHT middle lobe and RIGHT lower lobe bronchi. Small BILATERAL pleural effusions. Calcified granuloma deep in the POSTERIOR RIGHT lower lobe as noted previously. Upper Abdomen: Calcified  granulomata involving the liver and spleen. Surgical clips in the retroperitoneum from prior nephrectomy. Markedly dilated azygos and hemiazygous veins in the retrocrural space. Visualized upper abdomen otherwise unremarkable for the unenhanced technique. Musculoskeletal: Regional skeleton intact without acute or significant osseous abnormality. IMPRESSION: 1. Pneumonia involving the RIGHT middle lobe and BILATERAL lower lobes. 2. Associated small BILATERAL pleural effusions. 3. 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 4. Old granulomatous disease. Aortic Atherosclerosis (ICD10-I70.0). Electronically Signed   By: Evangeline Dakin M.D.   On: 08/10/2017 14:34    Results/Tests Pending at Time of Discharge: Tacrolimus level  Discharge Medications:  Allergies as of 08/17/2017      Reactions   Levofloxacin Itching, Rash      Medication List    STOP taking these medications   azithromycin 250 MG tablet Commonly known as:  ZITHROMAX   promethazine 25 MG tablet Commonly known as:  PHENERGAN     TAKE these medications   ALPRAZolam 0.25 MG tablet Commonly known as:  XANAX Take 0.25 mg by mouth daily as needed (FOR SVT).   aspirin EC 81 MG tablet Take 81 mg by mouth daily.   azaTHIOprine 50 MG tablet Commonly known as:  IMURAN Take 100 mg by mouth daily.   fluticasone 50 MCG/ACT nasal spray Commonly known as:  FLONASE Place 1 spray into both nostrils daily.   furosemide 40 MG tablet Commonly known as:  LASIX Take 40 mg by mouth daily.   latanoprost 0.005 % ophthalmic solution Commonly known as:  XALATAN Place 1 drop into the left eye at bedtime.   metoCLOPramide 5 MG tablet Commonly known as:  REGLAN TAKE 1 TABLET BY MOUTH TWICE A DAY  AT 8AM AND 10 PM What changed:  Another medication with the same name was removed. Continue taking this medication, and follow the directions you see  here.   nicotine 14 mg/24hr patch Commonly known as:  NICODERM CQ - dosed in mg/24 hours Place 1 patch (14 mg total) onto the skin daily.   omeprazole 40 MG capsule Commonly known as:  PRILOSEC Take 1 capsule (40 mg total) by mouth daily. What changed:  when to take this   ondansetron 4 MG disintegrating tablet Commonly known as:  ZOFRAN ODT Take 1 tablet (4 mg total) by mouth every 6 (six) hours as needed.   oxyCODONE-acetaminophen 10-325 MG tablet Commonly known as:  PERCOCET Take 1 tablet by mouth every 6 (six) hours as needed for pain.   predniSONE 5 MG tablet Commonly known as:  DELTASONE Take 5 mg by mouth daily. What changed:  Another medication with the same name was added. Make sure you understand how and when to take each.   predniSONE 20 MG tablet Commonly known as:  DELTASONE Take 2 tablets (40 mg total) by mouth daily with breakfast. Decrease dose by 10 mg (1/2 tab) every 3 days then go back to 5 mg daily Start taking on:  08/18/2017 What changed:  You were already taking a medication with the same name, and this prescription was added. Make sure you understand how and when to take each.   PROVENTIL HFA 108 (90 Base) MCG/ACT inhaler Generic drug:  albuterol Inhale 2 puffs into the lungs 4 (four) times daily as needed for shortness of breath.   tacrolimus 1 MG capsule Commonly known as:  PROGRAF Take 3 mg by mouth 2 (two) times daily.   zolpidem 10 MG tablet Commonly known as:  AMBIEN Take 10 mg by mouth at bedtime as needed for sleep.            Durable Medical Equipment  (From admission, onward)        Start     Ordered   08/12/17 1118  For home use only DME Tub bench  Once    Comments:  Tub /shower seat   08/12/17 1118     Discharge Instructions: Please refer to Patient Instructions section of EMR for full details.  Patient was counseled important signs and symptoms that should prompt return to medical care, changes in medications, dietary  instructions, activity restrictions, and follow up appointments.   Follow-Up Appointments: Follow-up Information    Donato Heinz, MD. Schedule an appointment as soon as possible for a visit in 1 week(s).   Specialty:  Nephrology Why:  for kidney function follow up Contact information: [address] [place] [address] [phone]          Daisy Floro, DO 08/17/2017, 4:43 PM PGY-2 [place]

## 2017-08-12 NOTE — Progress Notes (Signed)
TB test read Left forearm 0 mm induration, no redness or swelling noted to site.  Arjen Deringer, Tivis Ringer, RN

## 2017-08-13 DIAGNOSIS — M4802 Spinal stenosis, cervical region: Secondary | ICD-10-CM | POA: Diagnosis not present

## 2017-08-13 DIAGNOSIS — M1000 Idiopathic gout, unspecified site: Secondary | ICD-10-CM | POA: Diagnosis not present

## 2017-08-13 LAB — CBC
HCT: 22.7 % — ABNORMAL LOW (ref 36.0–46.0)
Hemoglobin: 7.3 g/dL — ABNORMAL LOW (ref 12.0–15.0)
MCH: 30 pg (ref 26.0–34.0)
MCHC: 32.2 g/dL (ref 30.0–36.0)
MCV: 93.4 fL (ref 78.0–100.0)
Platelets: 270 10*3/uL (ref 150–400)
RBC: 2.43 MIL/uL — ABNORMAL LOW (ref 3.87–5.11)
RDW: 21.8 % — ABNORMAL HIGH (ref 11.5–15.5)
WBC: 3.6 10*3/uL — ABNORMAL LOW (ref 4.0–10.5)

## 2017-08-13 LAB — BASIC METABOLIC PANEL
Anion gap: 12 (ref 5–15)
BUN: 48 mg/dL — ABNORMAL HIGH (ref 6–20)
CO2: 18 mmol/L — ABNORMAL LOW (ref 22–32)
Calcium: 8.2 mg/dL — ABNORMAL LOW (ref 8.9–10.3)
Chloride: 101 mmol/L (ref 98–111)
Creatinine, Ser: 4.36 mg/dL — ABNORMAL HIGH (ref 0.44–1.00)
GFR calc Af Amer: 13 mL/min — ABNORMAL LOW (ref >60)
GFR calc non Af Amer: 11 mL/min — ABNORMAL LOW (ref >60)
Glucose, Bld: 88 mg/dL (ref 70–99)
Potassium: 5.6 mmol/L — ABNORMAL HIGH (ref 3.5–5.1)
Sodium: 131 mmol/L — ABNORMAL LOW (ref 135–145)

## 2017-08-13 LAB — VANCOMYCIN, RANDOM: Vancomycin Rm: 22

## 2017-08-13 LAB — TACROLIMUS LEVEL: Tacrolimus (FK506) - LabCorp: 9.6 ng/mL (ref 2.0–20.0)

## 2017-08-13 MED ORDER — GUAIFENESIN-DM 100-10 MG/5ML PO SYRP
5.0000 mL | ORAL_SOLUTION | ORAL | Status: DC | PRN
  Administered 2017-08-13 – 2017-08-17 (×12): 5 mL via ORAL
  Filled 2017-08-13 (×13): qty 5

## 2017-08-13 MED ORDER — SODIUM POLYSTYRENE SULFONATE 15 GM/60ML PO SUSP
30.0000 g | Freq: Once | ORAL | Status: AC
  Administered 2017-08-13: 30 g via ORAL
  Filled 2017-08-13: qty 120

## 2017-08-13 NOTE — Progress Notes (Addendum)
Family Medicine Teaching Service Daily Progress Note Intern Pager: [phone]  Patient name: Tina Watkins Medical record number: [id number] Date of birth: 13-Nov-1965 Age: 52 y.o. Gender: female  Primary Care Provider: Donato Heinz, MD Consultants: Nephro Code Status: full  Pt Overview and Major Events to Date:  7/8 admitted  Assessment and Plan: Tina Watkins a 52 y.o.femalepresenting with fevers, cough, and abdominal pain. PMH is significant forkidney transplant, gastroparesis,chronic pain syndrome,gout, depression, anxiety, tobacco use  CAP - d/c IV Vanc/Zosyn (7/11) - Moxifloxacin PO initiated (7/11 --)  -prednisone 50x5days (7/9-7/13) then back to home 5mg  daily - blood cx (neg at Day 3) -Monitor vitals -Up with assistance  ?AKI onESRD status post kidney transplant: worsening, 4.36 (7/12) (baseline appears ~2.5) Patient has a history of ESRD with first renal transplant in 1989. Patient is now on her third transplanted kidney. Last transplant placed in 2005. Patient reports no issues with current kidney. Patient reports the baseline creatinine is around 2.0, although it ranges from 2-3.Patient's home medicationsinclude prednisone, tacrolimus, azathioprineand furosemide. - Hold lasix, will consider restarting if hypervolemic - Tacrolimus level (7/10): pending (per coldanato) - avoid nephrotoxic agents - avoid NSAIDs - monitor BMP - Sodium-bicarb IVF - Adjust med doses per GFR  - Imuran, Prograf and Prednisone. She is currently on higher dose of prednisone for COPD.  - Nephro consulted - "AKI likely 2/2 hypotension, infxn, use of vancomycin; repeat UA with microscopy; does not believe to be acute rejection: Bladder scan, ultrasound transplant kidney, strict I&O"  Chronic pain- chronic, unchanged, She is not a good candidate for NSAIDs due to her single functioning kidney and chronic kidney disease. -returned to home oxy 10q6 -tylenol  q6  Gastroparesis-stable Patient has a history of gastroparesis since 2010.Currently stable. Home medications include metoclopramide 5 mg twice daily. 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 -We will continue to monitor, will likely transfuse if continues to drop  Thrush: requested clotrimazole -clitrimazole troche  Tobacco use: smokes one half pack per day. Declines nicotine patch  GERD Patient takes omeprazole at home. -Continue as pantoprazole  Gout-stable am 7/10 Patient takes colchicine PRN at home. She feels like she might be getting a gout flair currently. - d/c colchicine  FEN/GI:renal diet Prophylaxis:heparin, aspirin  Disposition:stable  Subjective:  Patient is seen this morning resting comfortably in bed without nasal cannula eating breakfast. She reports feeling itchy, but denies headaches, chest pain, nausea, vomiting, abdominal pain, and dysuria. She reports feeling better and was asking about some of her labs.   Objective: Temp:  [97.8 F (36.6 C)-98.5 F (36.9 C)] 98.4 F (36.9 C) (07/12 1031) Pulse Rate:  [54-60] 60 (07/12 1031) Resp:  [16-20] 20 (07/12 1031) BP: (96-110)/(56-69) 98/59 (07/12 1031) SpO2:  [98 %-100 %] 98 % (07/12 1031) Weight:  [119 lb 11.2 oz (54.3 kg)] 119 lb 11.2 oz (54.3 kg) (07/12 0503)  Physical Exam: General: improved from 7/9,  Mouth: thrush on tongue, no lesions noted Cardiovascular: RRR, 2/6 murmur Respiratory: improved lower lung sounds, [place] discontinued (7/12), no cough during exam Abdomen: soft belly without tenderness to palpation Extremities: no pitting edema on exam, pain is well controlled; podagra episode has resolved (7/10)  Laboratory: CBC Latest Ref Rng & Units 08/13/2017 08/12/2017 08/11/2017  WBC 4.0 - 10.5 K/uL 3.6(L) 4.7 5.2  Hemoglobin 12.0 - 15.0 g/dL 7.3(L) 7.3(L) 7.7(L)  Hematocrit 36.0 - 46.0 %  22.7(L)  24.0(L) 24.5(L)  Platelets 150 - 400 K/uL 270 257 242   BMP Latest Ref Rng & Units 08/13/2017 08/12/2017 08/11/2017  Glucose 70 - 99 mg/dL 88 89 112(H)  BUN 6 - 20 mg/dL 48(H) 38(H) 28(H)  Creatinine 0.44 - 1.00 mg/dL 4.36(H) 3.89(H) 2.65(H)  Sodium 135 - 145 mmol/L 131(L) 133(L) 135  Potassium 3.5 - 5.1 mmol/L 5.6(H) 5.2(H) 5.1  Chloride 98 - 111 mmol/L 101 103 107  CO2 22 - 32 mmol/L 18(L) 16(L) 19(L)  Calcium 8.9 - 10.3 mg/dL 8.2(L) 8.8(L) 8.9   Imaging/Diagnostic Tests: Ct Abdomen Pelvis Wo Contrast  Result Date: 08/09/2017 CLINICAL DATA:  Abdominal pain with diverticulitis suspected. Hypotension. Fever for 1 week EXAM: CT ABDOMEN AND PELVIS WITHOUT CONTRAST TECHNIQUE: Multidetector CT imaging of the abdomen and pelvis was performed following the standard protocol without IV contrast. COMPARISON:  Chest x-ray earlier today FINDINGS: Lower chest: Airspace disease in the right middle and left lower lobes. Interlobular septal thickening at the right base where there is also ground-glass opacity. Posterior mediastinal varices. Hepatobiliary: No focal liver abnormality.No evidence of biliary obstruction or stone. Pancreas: Unremarkable. Spleen: Unremarkable. Adrenals/Urinary Tract: Left adrenal is difficult to visualize. No adrenal abnormality seen. Bilateral native nephrectomy. Transplant kidney in the right lower quadrant without hydronephrosis or collection. Prior failed transplant in the right lower quadrant with dense calcification. Unremarkable bladder. Stomach/Bowel:  No obstruction. No visible inflammation Vascular/Lymphatic: No acute vascular abnormality. No mass or adenopathy. Venous collaterals in the abdominal wall where there is also postoperative distortion inferiorly. Reproductive:Negative Other: No ascites or pneumoperitoneum. Musculoskeletal: Coarsened trabecular markings compatible with end-stage renal disease. IMPRESSION: 1. Multi lobar pneumonia. 2. Interstitial edema  asymmetric to the right lower lobe. 3. No acute finding in the abdomen. 4. Transplant kidneys in the right lower quadrant, one failed and the other with unremarkable appearance. Electronically Signed   By: Monte Fantasia M.D.   On: 08/09/2017 20:49   Dg Chest 2 View  Result Date: 08/09/2017 CLINICAL DATA:  Left-sided chest pain with shortness of breath, cough, and fever. Recent kidney transplant 1 week ago. EXAM: CHEST - 2 VIEW COMPARISON:  Chest x-ray dated July 18, 2017. FINDINGS: Stable mild cardiomegaly with unchanged SVC stent. Normal pulmonary vascularity. Unchanged scarring in the right middle lobe. No focal consolidation, pleural effusion, or pneumothorax. No acute osseous abnormality. IMPRESSION: No active cardiopulmonary disease. Electronically Signed   By: Titus Dubin M.D.   On: 08/09/2017 17:01   Dg Chest 2 View  Result Date: 07/18/2017 CLINICAL DATA:  Cough and congestion. EXAM: CHEST - 2 VIEW COMPARISON:  06/05/2017 FINDINGS: Mild cardiac enlargement. A stent is identified within the superior vena cava. No pleural effusion or edema identified. No airspace opacities identified. IMPRESSION: 1. No acute cardiopulmonary abnormalities. Electronically Signed   By: Kerby Moors M.D.   On: 07/18/2017 16:56   Ct Chest Wo Contrast  Result Date: 08/10/2017 CLINICAL DATA:  52 year old with multiple medical problems including end-stage renal disease and prior renal transplantation, presenting now with sepsis, cough and hypoxia. CT abdomen and pelvis yesterday demonstrated multilobar pneumonia in the visualized lungs. Current smoker. EXAM: CT CHEST WITHOUT CONTRAST TECHNIQUE: Multidetector CT imaging of the chest was performed following the standard protocol without IV contrast. COMPARISON:  Visualized lung bases on CT abdomen and pelvis yesterday. CT chest 10/24/2015. FINDINGS: Cardiovascular: SVC stent unchanged in appearance since the 2017 CT. Markedly dilated azygos and hemiazygous veins in the  mediastinum, unchanged, likely indicating chronic SVC occlusion. Heart size upper normal  to slightly enlarged, unchanged. Mild LAD and RIGHT coronary artery distribution atherosclerosis. Small chronic pericardial effusion, unchanged. Moderate atherosclerosis involving the thoracic and proximal abdominal aorta without evidence of aneurysm. Mediastinum/Nodes: Scattered normal sized lymph nodes throughout the mediastinum, many of which are calcified, unchanged. No new or enlarging lymphadenopathy. Normal appearing esophagus. Normal-appearing thyroid gland. Lungs/Pleura: Confluent airspace consolidation with air bronchograms in the RIGHT middle lobe, RIGHT lower lobe and LEFT lower lobe as noted on yesterday's CT. BILATERAL UPPER lobes are clear. Central airways patent with marked bronchial wall thickening involving the RIGHT middle lobe and RIGHT lower lobe bronchi. Small BILATERAL pleural effusions. Calcified granuloma deep in the POSTERIOR RIGHT lower lobe as noted previously. Upper Abdomen: Calcified granulomata involving the liver and spleen. Surgical clips in the retroperitoneum from prior nephrectomy. Markedly dilated azygos and hemiazygous veins in the retrocrural space. Visualized upper abdomen otherwise unremarkable for the unenhanced technique. Musculoskeletal: Regional skeleton intact without acute or significant osseous abnormality. IMPRESSION: 1. Pneumonia involving the RIGHT middle lobe and BILATERAL lower lobes. 2. Associated small BILATERAL pleural effusions. 3. 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 4. Old granulomatous disease. Aortic Atherosclerosis (ICD10-I70.0). Electronically Signed   By: Evangeline Dakin M.D.   On: 08/10/2017 14:34   US Renal Transplant W/doppler  Result Date: 08/12/2017 CLINICAL DATA:  Acute renal injury.  Renal transplant. EXAM: ULTRASOUND OF RENAL TRANSPLANT WITH RENAL  DOPPLER ULTRASOUND TECHNIQUE: Ultrasound examination of the renal transplant was performed with gray-scale, color and duplex doppler evaluation. COMPARISON:  None. FINDINGS: Transplant kidney location: Right lower quadrant Transplant Kidney: Length: 11.2 cm. Normal in size and parenchymal echogenicity. No evidence of mass or hydronephrosis. No peri-transplant fluid collection seen. Color flow in the main renal artery:  Yes Color flow in the main renal vein:  Yes Duplex Doppler Evaluation: Main Renal Artery Resistive Index: 0.65 Venous waveform in main renal vein:  Present Intrarenal resistive index in upper pole:  0.58 (normal 0.6-0.8; equivocal 0.8-0.9; abnormal >= 0.9) Intrarenal resistive index in lower pole: 0.61 (normal 0.6-0.8; equivocal 0.8-0.9; abnormal >= 0.9) Bladder: Normal for degree of bladder distention. Other findings:  Right-sided ureteral jet is demonstrated. IMPRESSION: Normal resistive indices for the transplanted right lower quadrant kidney. Electronically Signed   By: Ashley Royalty M.D.   On: 08/12/2017 20:26    Milus Banister, [place], PGY-1 08/13/2017 2:11 PM [place] Intern pager: [phone], text pages welcome

## 2017-08-13 NOTE — Progress Notes (Signed)
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 Geanie Kenning w/ [facility] tx for PNA after p/w fever and cough.  1) Acute kidney injury on CKD likely due to hypotension, infection (pneumonia) and the use of vancomycin: The urinalysis done on admission unremarkable, however no microscopy was done. Does not appear to be acute rejection. Likely in ATN.  -Repeat urinalysis with microscopy -> minimal WBC and proteinuria. Would expect more WBC's and also tenderness over transplant kidney with rejection. -bladder scan -> only 72ml and [name] transplant kidney -> no obstructive w/ nl echogen -strict I/O, no exact measurement of urine output done, d/w the patient.  -cont sodium bicarbonate IVF (serum C02 level 16) -she is off of vancomycin now (last dose 7/10) -> will check a level for academic purposes; should be very low by now.  -Monitor BMP, avoid nephrotoxins. -please adjust the medication dose per GFR.  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 Continue home immunosuppressants including imuran 100 mg daily, prograf 3 mg in am and 2 mg qpm  and prednisone. She is currently on higher dose of prednisone for COPD. Follow up prograf level. -She follows with Dr. Littie Deeds at  [facility].  3)  Anemia of chronic disease: The iron sat was 28 % and ferritin 1149 on 07/02/2017. She received procrit on 08/06/17 at Short Stay. No sign of infection. Low Hb likely due to infection ? BM suppression.   4) Multifocal pneumonia: on Avelox. Off Vancomycin. Follow up culture results.    Subjective:   Mild dyspnea but denies f/c/n/v.   Objective:   BP 110/69 (BP Location: Left Arm)   Pulse (!) 55   Temp 97.9 F (36.6 C) (Oral)   Resp 20   Ht 4\' 11"   (1.499 m)   Wt 54.3 kg (119 lb 11.2 oz)   SpO2 100%   BMI 24.18 kg/m   Intake/Output Summary (Last 24 hours) at 08/13/2017 0925 Last data filed at 08/13/2017 0656 Gross per 24 hour  Intake 942.76 ml  Output 100 ml  Net 842.76 ml   Weight change: 1.724 kg (3 lb 12.8 oz)  Physical Exam: General exam: Calm and comfortable; no overt distress  Respiratory system: Clear to auscultation. Respiratory effort normal. No wheezing or crackle Cardiovascular system: S1 & S2 heard, RRR.  Tr pedal edema. Gastrointestinal system: Abdomen is nondistended, soft and nontender. Normal bowel sounds heard. No TTP over transplanted kidney on RLQ Central nervous system: Alert and oriented. No focal neurological deficits. Extremities: Symmetric 5 x 5 power. Skin: No rashes, lesions or ulcers   Imaging: US Renal Transplant W/doppler  Result Date: 08/12/2017 CLINICAL DATA:  Acute renal injury.  Renal transplant. EXAM: ULTRASOUND OF RENAL TRANSPLANT WITH RENAL DOPPLER ULTRASOUND TECHNIQUE: Ultrasound examination of the renal transplant was performed with gray-scale, color and duplex doppler evaluation. COMPARISON:  None. FINDINGS: Transplant kidney location: Right lower quadrant Transplant Kidney: Length: 11.2 cm. Normal in size and parenchymal echogenicity. No evidence of mass or hydronephrosis. No peri-transplant fluid collection seen. Color flow in the main renal artery:  Yes Color flow in the main renal vein:  Yes Duplex Doppler Evaluation: Main Renal Artery Resistive Index: 0.65 Venous waveform in  main renal vein:  Present Intrarenal resistive index in upper pole:  0.58 (normal 0.6-0.8; equivocal 0.8-0.9; abnormal >= 0.9) Intrarenal resistive index in lower pole: 0.61 (normal 0.6-0.8; equivocal 0.8-0.9; abnormal >= 0.9) Bladder: Normal for degree of bladder distention. Other findings:  Right-sided ureteral jet is demonstrated. IMPRESSION: Normal resistive indices for the transplanted right lower quadrant kidney.  Electronically Signed   By: Ashley Royalty M.D.   On: 08/12/2017 20:26    Labs: BMET Recent Labs  Lab 08/09/17 1452 08/09/17 1612 08/10/17 0417 08/11/17 0531 08/12/17 0527 08/13/17 0404  NA 136 137 136 135 133* 131*  K 4.4 4.3 4.3 5.1 5.2* 5.6*  CL 103 102 106 107 103 101  CO2 21* 23 16* 19* 16* 18*  GLUCOSE 95 95 118* 112* 89 88  BUN 25* 25* 24* 28* 38* 48*  CREATININE 2.85* 2.95* 2.57* 2.65* 3.89* 4.36*  CALCIUM 9.2 9.5 8.5* 8.9 8.8* 8.2*   CBC Recent Labs  Lab 08/10/17 0417 08/11/17 1023 08/12/17 0527 08/13/17 0628  WBC 5.4 5.2 4.7 3.6*  HGB 7.1* 7.7* 7.3* 7.3*  HCT 23.0* 24.5* 24.0* 22.7*  MCV 98.3 98.0 98.8 93.4  PLT 224 242 257 270    Medications:    . aspirin EC  81 mg Oral Daily  . azaTHIOprine  100 mg Oral Daily  . clotrimazole  10 mg Oral 5 X Daily  . fluticasone  1 spray Each Nare Daily  . heparin  5,000 Units Subcutaneous Q8H  . latanoprost  1 drop Left Eye QHS  . metoCLOPramide  5 mg Oral BID  . moxifloxacin  400 mg Oral Q24H  . nicotine  14 mg Transdermal Daily  . pantoprazole  40 mg Oral Daily  . predniSONE  50 mg Oral Daily  . tacrolimus  2 mg Oral QPM  . tacrolimus  3 mg Oral Q0600      Otelia Santee, MD 08/13/2017, 9:25 AM

## 2017-08-13 NOTE — Progress Notes (Signed)
Physical Therapy Treatment Patient Details Name: Tina Watkins MRN: [id number] DOB: 07/15/1965 Today's Date: 08/13/2017    History of Present Illness Pt is a 52 y.o. F wih significant PMH of kidney transplant, gastroparesis, chronic pain syndrome, gout, depression, anxiety, tobacco use. Presenting with fevers, cough, and abdominal pain. CT chest significant for multilobar pneumonia and interstitial edema primarily in the right lobe.     PT Comments    Patient with noticeably improved gait speed and endurance this session. Able to ambulate 200 feet with no device without difficulty. Patient with continued upper back pain and noted right scapular winging and tight upper thoracic erector spinae. Provided patient with postural re-education and serratus anterior strengthening exercises.     Follow Up Recommendations  Home health PT     Equipment Recommendations  None recommended by PT    Recommendations for Other Services       Precautions / Restrictions Precautions Precautions: Fall Restrictions Weight Bearing Restrictions: No    Mobility  Bed Mobility Overal bed mobility: Independent                Transfers Overall transfer level: Independent                  Ambulation/Gait Ambulation/Gait assistance: Modified independent (Device/Increase time) Gait Distance (Feet): 200 Feet Assistive device: None;IV Pole Gait Pattern/deviations: Step-through pattern;Decreased stride length;Decreased dorsiflexion - right;Decreased dorsiflexion - left Gait velocity: decreased   General Gait Details: Patient with slightly increased gait speed this session and required no standing rest breaks.   Stairs             Wheelchair Mobility    Modified Rankin (Stroke Patients Only)       Balance Overall balance assessment: Mild deficits observed, not formally tested                                          Cognition Arousal/Alertness:  Awake/alert Behavior During Therapy: WFL for tasks assessed/performed Overall Cognitive Status: Within Functional Limits for tasks assessed                                        Exercises Other Exercises Other Exercises: Soft tissue mobilization of upper thoracic erector spinae Other Exercises: Bilateral shoulder squeezes x 10 (3 second holds) Other Exercises: Supine scapular punches x 10     General Comments        Pertinent Vitals/Pain Pain Assessment: Faces Faces Pain Scale: Hurts a little bit Pain Location: Chronic upper back pain, L foot pain from gout Pain Descriptors / Indicators: Aching;Sore Pain Intervention(s): Monitored during session    Home Living                      Prior Function            PT Goals (current goals can now be found in the care plan section) Acute Rehab PT Goals Patient Stated Goal: get better and go home PT Goal Formulation: With patient Time For Goal Achievement: 08/25/17 Potential to Achieve Goals: Good Progress towards PT goals: Progressing toward goals    Frequency    Min 3X/week      PT Plan Current plan remains appropriate    Co-evaluation  AM-PAC PT "6 Clicks" Daily Activity  Outcome Measure  Difficulty turning over in bed (including adjusting bedclothes, sheets and blankets)?: None Difficulty moving from lying on back to sitting on the side of the bed? : None Difficulty sitting down on and standing up from a chair with arms (e.g., wheelchair, bedside commode, etc,.)?: None Help needed moving to and from a bed to chair (including a wheelchair)?: None Help needed walking in hospital room?: A Little Help needed climbing 3-5 steps with a railing? : A Little 6 Click Score: 22    End of Session Equipment Utilized During Treatment: Gait belt Activity Tolerance: Patient tolerated treatment well Patient left: in bed;with call bell/phone within reach   PT Visit Diagnosis:  Unsteadiness on feet (R26.81);Muscle weakness (generalized) (M62.81);Difficulty in walking, not elsewhere classified (R26.2);Pain     Time: 1204-1228 PT Time Calculation (min) (ACUTE ONLY): 24 min  Charges:  $Therapeutic Exercise: 8-22 mins $Therapeutic Activity: 8-22 mins                    G Codes:       Ellamae Sia, PT, DPT Acute Rehabilitation Services  Pager: [phone]    Willy Eddy 08/13/2017, 5:07 PM

## 2017-08-14 LAB — TROPONIN I: Troponin I: <0.03 ng/mL (ref <0.03)

## 2017-08-14 LAB — CULTURE, BLOOD (ROUTINE X 2)
Culture: NO GROWTH
Culture: NO GROWTH
Special Requests: ADEQUATE

## 2017-08-14 LAB — CBC
HCT: 25.6 % — ABNORMAL LOW (ref 36.0–46.0)
Hemoglobin: 8.1 g/dL — ABNORMAL LOW (ref 12.0–15.0)
MCH: 30 pg (ref 26.0–34.0)
MCHC: 31.6 g/dL (ref 30.0–36.0)
MCV: 94.8 fL (ref 78.0–100.0)
Platelets: 292 10*3/uL (ref 150–400)
RBC: 2.7 MIL/uL — ABNORMAL LOW (ref 3.87–5.11)
RDW: 21.5 % — ABNORMAL HIGH (ref 11.5–15.5)
WBC: 3.7 10*3/uL — ABNORMAL LOW (ref 4.0–10.5)

## 2017-08-14 LAB — URINALYSIS, ROUTINE W REFLEX MICROSCOPIC
Bilirubin Urine: NEGATIVE
Glucose, UA: NEGATIVE mg/dL
Hgb urine dipstick: NEGATIVE
Ketones, ur: NEGATIVE mg/dL
Leukocytes, UA: NEGATIVE
Nitrite: NEGATIVE
Protein, ur: NEGATIVE mg/dL
Specific Gravity, Urine: 1.009 (ref 1.005–1.030)
pH: 5 (ref 5.0–8.0)

## 2017-08-14 LAB — BASIC METABOLIC PANEL
Anion gap: 15 (ref 5–15)
BUN: 52 mg/dL — ABNORMAL HIGH (ref 6–20)
CO2: 25 mmol/L (ref 22–32)
Calcium: 8.2 mg/dL — ABNORMAL LOW (ref 8.9–10.3)
Chloride: 96 mmol/L — ABNORMAL LOW (ref 98–111)
Creatinine, Ser: 4.52 mg/dL — ABNORMAL HIGH (ref 0.44–1.00)
GFR calc Af Amer: 12 mL/min — ABNORMAL LOW (ref >60)
GFR calc non Af Amer: 10 mL/min — ABNORMAL LOW (ref >60)
Glucose, Bld: 84 mg/dL (ref 70–99)
Potassium: 3.8 mmol/L (ref 3.5–5.1)
Sodium: 136 mmol/L (ref 135–145)

## 2017-08-14 LAB — IRON AND TIBC
Iron: 122 ug/dL (ref 28–170)
Saturation Ratios: 87 % — ABNORMAL HIGH (ref 10.4–31.8)
TIBC: 140 ug/dL — ABNORMAL LOW (ref 250–450)
UIBC: 18 ug/dL

## 2017-08-14 LAB — FERRITIN: Ferritin: 1601 ng/mL — ABNORMAL HIGH (ref 11–307)

## 2017-08-14 LAB — SAVE SMEAR

## 2017-08-14 MED ORDER — DARBEPOETIN ALFA 60 MCG/0.3ML IJ SOSY
60.0000 ug | PREFILLED_SYRINGE | Freq: Once | INTRAMUSCULAR | Status: AC
  Administered 2017-08-14: 60 ug via SUBCUTANEOUS
  Filled 2017-08-14: qty 0.3

## 2017-08-14 NOTE — Progress Notes (Signed)
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 Geanie Kenning w/ [facility] tx for PNA after p/w fever and cough.  1) Acute kidney injury on CKD likely due to hypotension, infection (pneumonia) and the use of vancomycin: The urinalysis done on admission unremarkable, however no microscopy was done. Does not appear to be acute rejection. Likely in ATN mult with hypotensive episodes and Vancomycin (vanco level  22 on 7/12 w/ last dose 7/10).  -Repeat urinalysis with microscopy -> minimal WBC and proteinuria. Would expect more WBC's and also tenderness over transplant kidney with rejection. No TTP over transplant kidney. -bladder scan -> only 15ml and [name] transplant kidney -> no obstructive w/ nl echogen -strict I/O, no exact measurement of urine output done, d/w the patient.   - STOP the sodium bicarbonate IVF (pt getting overloaded)  -she is off of vancomycin now (last dose 7/10) -> 22 on 7/12.  -Monitor BMP, avoid nephrotoxins. -please adjust the medication dose per GFR.  - Will f/u on tacrolimus trough (still may be from tac tox but lower on DDx) --> 9.6 on 7/10 - will take a look at the urine microscopy later this AM; she does not need to void at this time.   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 Continue home immunosuppressants including imuran 100 mg daily, prograf 3 mg in am and 2 mg qpmand prednisone. She is currently on higher dose of prednisone for COPD. Follow up prograf level. -She follows with Dr. Littie Deeds at [facility].  3)  Anemia of chronic disease: The iron sat was 28 % and ferritin 1149 on 07/02/2017. She received procrit on 08/06/17 at Short Stay. No sign of infection. Low Hb likely due to infection ? BM suppression.  - will give another dose of aranesp 50mcg today; trying to avoid a  blood transfusion bec of sensitization with the transplant.  4) Multifocal pneumonia: on Avelox. Off Vancomycin. Follow up culture results.    Subjective:   Mild dyspnea but denies f/c.  Nausea which she attributes to bloating.   Objective:   BP 105/60 (BP Location: Left Arm)   Pulse (!) 55   Temp 98.1 F (36.7 C) (Oral)   Resp 16   Ht 4\' 11"  (1.499 m)   Wt 54.8 kg (120 lb 12.8 oz)   SpO2 94%   BMI 24.40 kg/m   Intake/Output Summary (Last 24 hours) at 08/14/2017 0756 Last data filed at 08/14/2017 0600 Gross per 24 hour  Intake 480 ml  Output 400 ml  Net 80 ml   Weight change: 0.499 kg (1 lb 1.6 oz)  Physical Exam: General exam:Calm and comfortable; no overt distress  Respiratory system: Clear to auscultation. Respiratory effort normal. No wheezing or crackle Cardiovascular system:S1 &S2 heard, RRR. Tr pedal edema. Gastrointestinal system:Abdomen is nondistended, soft and nontender. Normal bowel sounds heard. No TTP over transplanted kidney on RLQ Central nervous system:Alert and oriented. No focal neurological deficits. Extremities: Symmetric 5 x 5 power. Skin: No rashes, lesions or ulcers     Imaging: US Renal Transplant W/doppler  Result Date: 08/12/2017 CLINICAL DATA:  Acute renal injury.  Renal transplant. EXAM: ULTRASOUND OF RENAL TRANSPLANT WITH RENAL DOPPLER ULTRASOUND TECHNIQUE: Ultrasound examination of the renal transplant was performed with gray-scale, color and duplex doppler evaluation. COMPARISON:  None.  FINDINGS: Transplant kidney location: Right lower quadrant Transplant Kidney: Length: 11.2 cm. Normal in size and parenchymal echogenicity. No evidence of mass or hydronephrosis. No peri-transplant fluid collection seen. Color flow in the main renal artery:  Yes Color flow in the main renal vein:  Yes Duplex Doppler Evaluation: Main Renal Artery Resistive Index: 0.65 Venous waveform in main renal vein:  Present Intrarenal resistive index in upper  pole:  0.58 (normal 0.6-0.8; equivocal 0.8-0.9; abnormal >= 0.9) Intrarenal resistive index in lower pole: 0.61 (normal 0.6-0.8; equivocal 0.8-0.9; abnormal >= 0.9) Bladder: Normal for degree of bladder distention. Other findings:  Right-sided ureteral jet is demonstrated. IMPRESSION: Normal resistive indices for the transplanted right lower quadrant kidney. Electronically Signed   By: Ashley Royalty M.D.   On: 08/12/2017 20:26    Labs: BMET Recent Labs  Lab 08/09/17 1452 08/09/17 1612 08/10/17 0417 08/11/17 0531 08/12/17 0527 08/13/17 0404 08/14/17 0545  NA 136 137 136 135 133* 131* 136  K 4.4 4.3 4.3 5.1 5.2* 5.6* 3.8  CL 103 102 106 107 103 101 96*  CO2 21* 23 16* 19* 16* 18* 25  GLUCOSE 95 95 118* 112* 89 88 84  BUN 25* 25* 24* 28* 38* 48* 52*  CREATININE 2.85* 2.95* 2.57* 2.65* 3.89* 4.36* 4.52*  CALCIUM 9.2 9.5 8.5* 8.9 8.8* 8.2* 8.2*   CBC Recent Labs  Lab 08/11/17 1023 08/12/17 0527 08/13/17 0628 08/14/17 0545  WBC 5.2 4.7 3.6* 3.7*  HGB 7.7* 7.3* 7.3* 8.1*  HCT 24.5* 24.0* 22.7* 25.6*  MCV 98.0 98.8 93.4 94.8  PLT 242 257 270 292    Medications:    . aspirin EC  81 mg Oral Daily  . azaTHIOprine  100 mg Oral Daily  . clotrimazole  10 mg Oral 5 X Daily  . fluticasone  1 spray Each Nare Daily  . heparin  5,000 Units Subcutaneous Q8H  . latanoprost  1 drop Left Eye QHS  . metoCLOPramide  5 mg Oral BID  . moxifloxacin  400 mg Oral Q24H  . nicotine  14 mg Transdermal Daily  . pantoprazole  40 mg Oral Daily  . predniSONE  50 mg Oral Daily  . tacrolimus  2 mg Oral QPM  . tacrolimus  3 mg Oral Q0600      Otelia Santee, MD 08/14/2017, 7:56 AM

## 2017-08-14 NOTE — Progress Notes (Signed)
Family Medicine Teaching Service Daily Progress Note Intern Pager: [phone]  Patient name: Tina Watkins Medical record number: [id number] Date of birth: 1965/07/03 Age: 52 y.o. Gender: female  Primary Care Provider: Donato Heinz, MD Consultants: Nephro Code Status: full  Pt Overview and Major Events to Date:  7/8 admitted  Assessment and Plan: DAVIA SMYRE a 52 y.o.femalepresenting with fevers, cough, and abdominal pain. PMH is significant forkidney transplant, gastroparesis,chronic pain syndrome,gout, depression, anxiety, tobacco use  AKI - s/p renal transplant x3: worsening, 4.52 (7/13) (baseline appears ~2.5) Patient has a history of ESRD with first renal transplant in 1989. Patient is now on her third transplanted kidney. Last transplant placed in 2005. Patient reports no issues with current kidney. Patient reports the baseline creatinine is around 2.0, although it ranges from 2-3.Patient's home medicationsinclude prednisone, tacrolimus, azathioprineand furosemide. Nephro consulted - "AKI likely 2/2 hypotension, infxn, use of vancomycin; repeat UA with microscopy; does not believe to be acute rejection: Bladder scan, ultrasound transplant kidney, strict I&O" - Hold lasix, will consider restarting if hypervolemic - Tacrolimus level (7/10): 9.6 (WNL 2.0-20.0) - avoid nephrotoxic agents - avoid NSAIDs - monitor BMP - DC Sodium-bicarb IVF - Adjust med doses per GFR  - Imuran, Prograf and Prednisone. She is currently on higher dose of prednisone for COPD.   CAP w/ COPD Patient has been seen in the ED recently, no hospitalizations.  Patient did not fit criteria for HCAP. - d/c IV Vanc/Zosyn (7/9-7/11) - Moxifloxacin PO initiated (7/11-7/18) 8 days total  - prednisone 50x5days (7/9-7/13) then back to home 5mg  daily - blood cx (neg at Day 4)  Chest pressure Patient mention chest pressure this a.m. (7/13)  Pressure was substernal without radiation to  extremities back or jaw.  Of note patient has a significant history of anxiety. -EKG -Troponin  Chronic pain- chronic, unchanged, She is not a good candidate for NSAIDs due to her single functioning kidney and chronic kidney disease. -returned to home oxy 10q6 -tylenol q6  Gastroparesis-stable Patient has a history of gastroparesis since 2010.Currently stable. Home medications include metoclopramide 5 mg twice daily. 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  Consistent with anemia due to renal insufficiency. -We will continue to monitor, will likely transfuse if continues to drop  Thrush: requested clotrimazole -clitrimazole troche  Tobacco use: smokes one half pack per day. Declines nicotine patch  GERD Patient takes omeprazole at home. -Continue as pantoprazole  Gout-stable am 7/10 Patient takes colchicine PRN at home. She feels like she might be getting a gout flair currently. - d/c colchicine  FEN/GI:renal diet Prophylaxis:heparin, aspirin  Disposition:To remain on MedSurg until the resolution of AKI  Subjective:  Patient was seen this morning.  Resting in bed comfortably.  Patient generally is feeling better though complains of feeling increased fluid on her stomach as though she is pregnant in addition to chest pressure.  Chest pressure does not radiates to her arms back or jaw.  Patient has good knowledge of her medical problems and treatment.  Objective: Temp:  [97.9 F (36.6 C)-98.4 F (36.9 C)] 98.1 F (36.7 C) (07/12 2158) Pulse Rate:  [55-60] 56 (07/12 2158) Resp:  [16-20] 16 (07/12 2158) BP: (98-112)/(59-69) 112/62 (07/12 2158) SpO2:  [98 %-100 %] 100 % (07/12 2158) Weight:  [54.3 kg (119 lb 11.2 oz)-54.8 kg (120 lb 12.8 oz)] 54.8  kg (120 lb 12.8 oz) (07/12 2256)  Physical  Exam: General: Alert and cooperative and appears to be in no acute distress.  Patient was breathing comfortably on room air. HEENT: Neck non-tender without lymphadenopathy, masses or thyromegaly Cardio: Normal A1 and S2, no S3 or S4. Rhythm is regular.  3/6 systolic murmur best heard at the left sternal border.   Pulm: Clear to auscultation bilaterally, no crackles, wheezing, or diminished breath sounds. Normal respiratory effort Abdomen: Bowel sounds normal. Abdomen appeared slightly enlarged from previously, soft and tender to palpation.  Extremities: No peripheral edema. Warm/ well perfused.  Strong radial and pedal pulses. Neuro: Cranial nerves grossly intact    Laboratory: CBC Latest Ref Rng & Units 08/13/2017 08/12/2017 08/11/2017  WBC 4.0 - 10.5 K/uL 3.6(L) 4.7 5.2  Hemoglobin 12.0 - 15.0 g/dL 7.3(L) 7.3(L) 7.7(L)  Hematocrit 36.0 - 46.0 % 22.7(L) 24.0(L) 24.5(L)  Platelets 150 - 400 K/uL 270 257 242   BMP Latest Ref Rng & Units 08/13/2017 08/12/2017 08/11/2017  Glucose 70 - 99 mg/dL 88 89 112(H)  BUN 6 - 20 mg/dL 48(H) 38(H) 28(H)  Creatinine 0.44 - 1.00 mg/dL 4.36(H) 3.89(H) 2.65(H)  Sodium 135 - 145 mmol/L 131(L) 133(L) 135  Potassium 3.5 - 5.1 mmol/L 5.6(H) 5.2(H) 5.1  Chloride 98 - 111 mmol/L 101 103 107  CO2 22 - 32 mmol/L 18(L) 16(L) 19(L)  Calcium 8.9 - 10.3 mg/dL 8.2(L) 8.8(L) 8.9   Imaging/Diagnostic Tests: Ct Abdomen Pelvis Wo Contrast  Result Date: 08/09/2017 CLINICAL DATA:  Abdominal pain with diverticulitis suspected. Hypotension. Fever for 1 week EXAM: CT ABDOMEN AND PELVIS WITHOUT CONTRAST TECHNIQUE: Multidetector CT imaging of the abdomen and pelvis was performed following the standard protocol without IV contrast. COMPARISON:  Chest x-ray earlier today FINDINGS: Lower chest: Airspace disease in the right middle and left lower lobes. Interlobular septal thickening at the right base where there is also ground-glass opacity. Posterior mediastinal varices. Hepatobiliary:  No focal liver abnormality.No evidence of biliary obstruction or stone. Pancreas: Unremarkable. Spleen: Unremarkable. Adrenals/Urinary Tract: Left adrenal is difficult to visualize. No adrenal abnormality seen. Bilateral native nephrectomy. Transplant kidney in the right lower quadrant without hydronephrosis or collection. Prior failed transplant in the right lower quadrant with dense calcification. Unremarkable bladder. Stomach/Bowel:  No obstruction. No visible inflammation Vascular/Lymphatic: No acute vascular abnormality. No mass or adenopathy. Venous collaterals in the abdominal wall where there is also postoperative distortion inferiorly. Reproductive:Negative Other: No ascites or pneumoperitoneum. Musculoskeletal: Coarsened trabecular markings compatible with end-stage renal disease. IMPRESSION: 1. Multi lobar pneumonia. 2. Interstitial edema asymmetric to the right lower lobe. 3. No acute finding in the abdomen. 4. Transplant kidneys in the right lower quadrant, one failed and the other with unremarkable appearance. Electronically Signed   By: Monte Fantasia M.D.   On: 08/09/2017 20:49   Dg Chest 2 View  Result Date: 08/09/2017 CLINICAL DATA:  Left-sided chest pain with shortness of breath, cough, and fever. Recent kidney transplant 1 week ago. EXAM: CHEST - 2 VIEW COMPARISON:  Chest x-ray dated July 18, 2017. FINDINGS: Stable mild cardiomegaly with unchanged SVC stent. Normal pulmonary vascularity. Unchanged scarring in the right middle lobe. No focal consolidation, pleural effusion, or pneumothorax. No acute osseous abnormality. IMPRESSION: No active cardiopulmonary disease. Electronically Signed   By: Titus Dubin M.D.   On: 08/09/2017 17:01   Dg Chest 2 View  Result Date: 07/18/2017 CLINICAL DATA:  Cough and congestion. EXAM: CHEST - 2 VIEW COMPARISON:  06/05/2017 FINDINGS: Mild cardiac enlargement. A  stent is identified within the superior vena cava. No pleural effusion or edema identified.  No airspace opacities identified. IMPRESSION: 1. No acute cardiopulmonary abnormalities. Electronically Signed   By: Kerby Moors M.D.   On: 07/18/2017 16:56   Ct Chest Wo Contrast  Result Date: 08/10/2017 CLINICAL DATA:  52 year old with multiple medical problems including end-stage renal disease and prior renal transplantation, presenting now with sepsis, cough and hypoxia. CT abdomen and pelvis yesterday demonstrated multilobar pneumonia in the visualized lungs. Current smoker. EXAM: CT CHEST WITHOUT CONTRAST TECHNIQUE: Multidetector CT imaging of the chest was performed following the standard protocol without IV contrast. COMPARISON:  Visualized lung bases on CT abdomen and pelvis yesterday. CT chest 10/24/2015. FINDINGS: Cardiovascular: SVC stent unchanged in appearance since the 2017 CT. Markedly dilated azygos and hemiazygous veins in the mediastinum, unchanged, likely indicating chronic SVC occlusion. Heart size upper normal to slightly enlarged, unchanged. Mild LAD and RIGHT coronary artery distribution atherosclerosis. Small chronic pericardial effusion, unchanged. Moderate atherosclerosis involving the thoracic and proximal abdominal aorta without evidence of aneurysm. Mediastinum/Nodes: Scattered normal sized lymph nodes throughout the mediastinum, many of which are calcified, unchanged. No new or enlarging lymphadenopathy. Normal appearing esophagus. Normal-appearing thyroid gland. Lungs/Pleura: Confluent airspace consolidation with air bronchograms in the RIGHT middle lobe, RIGHT lower lobe and LEFT lower lobe as noted on yesterday's CT. BILATERAL UPPER lobes are clear. Central airways patent with marked bronchial wall thickening involving the RIGHT middle lobe and RIGHT lower lobe bronchi. Small BILATERAL pleural effusions. Calcified granuloma deep in the POSTERIOR RIGHT lower lobe as noted previously. Upper Abdomen: Calcified granulomata involving the liver and spleen. Surgical clips in the  retroperitoneum from prior nephrectomy. Markedly dilated azygos and hemiazygous veins in the retrocrural space. Visualized upper abdomen otherwise unremarkable for the unenhanced technique. Musculoskeletal: Regional skeleton intact without acute or significant osseous abnormality. IMPRESSION: 1. Pneumonia involving the RIGHT middle lobe and BILATERAL lower lobes. 2. Associated small BILATERAL pleural effusions. 3. 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 4. Old granulomatous disease. Aortic Atherosclerosis (ICD10-I70.0). Electronically Signed   By: Evangeline Dakin M.D.   On: 08/10/2017 14:34   US Renal Transplant W/doppler  Result Date: 08/12/2017 CLINICAL DATA:  Acute renal injury.  Renal transplant. EXAM: ULTRASOUND OF RENAL TRANSPLANT WITH RENAL DOPPLER ULTRASOUND TECHNIQUE: Ultrasound examination of the renal transplant was performed with gray-scale, color and duplex doppler evaluation. COMPARISON:  None. FINDINGS: Transplant kidney location: Right lower quadrant Transplant Kidney: Length: 11.2 cm. Normal in size and parenchymal echogenicity. No evidence of mass or hydronephrosis. No peri-transplant fluid collection seen. Color flow in the main renal artery:  Yes Color flow in the main renal vein:  Yes Duplex Doppler Evaluation: Main Renal Artery Resistive Index: 0.65 Venous waveform in main renal vein:  Present Intrarenal resistive index in upper pole:  0.58 (normal 0.6-0.8; equivocal 0.8-0.9; abnormal >= 0.9) Intrarenal resistive index in lower pole: 0.61 (normal 0.6-0.8; equivocal 0.8-0.9; abnormal >= 0.9) Bladder: Normal for degree of bladder distention. Other findings:  Right-sided ureteral jet is demonstrated. IMPRESSION: Normal resistive indices for the transplanted right lower quadrant kidney. Electronically Signed   By: Ashley Royalty M.D.   On: 08/12/2017 20:26    Milus Banister, [place], PGY-1 08/14/2017 4:46 AM [facility]PTS Intern pager: [phone], text pages welcome

## 2017-08-14 NOTE — Progress Notes (Signed)
Chaplain visited with patient at her request.  Had prayer and spent some time with her.Conard Novak, Chaplain   08/14/17 1500  Clinical Encounter Type  Visited With Patient  Visit Type Initial;Spiritual support  Referral From Patient;Physician  Consult/Referral To Chaplain  Spiritual Encounters  Spiritual Needs Prayer;Emotional  Stress Factors  Patient Stress Factors Family relationships  Family Stress Factors Family relationships

## 2017-08-15 DIAGNOSIS — J189 Pneumonia, unspecified organism: Secondary | ICD-10-CM

## 2017-08-15 DIAGNOSIS — B370 Candidal stomatitis: Secondary | ICD-10-CM

## 2017-08-15 DIAGNOSIS — Z940 Kidney transplant status: Secondary | ICD-10-CM

## 2017-08-15 DIAGNOSIS — N179 Acute kidney failure, unspecified: Secondary | ICD-10-CM

## 2017-08-15 LAB — CBC
HCT: 25.8 % — ABNORMAL LOW (ref 36.0–46.0)
Hemoglobin: 8.4 g/dL — ABNORMAL LOW (ref 12.0–15.0)
MCH: 30.1 pg (ref 26.0–34.0)
MCHC: 32.6 g/dL (ref 30.0–36.0)
MCV: 92.5 fL (ref 78.0–100.0)
Platelets: 273 K/uL (ref 150–400)
RBC: 2.79 MIL/uL — ABNORMAL LOW (ref 3.87–5.11)
RDW: 21.2 % — ABNORMAL HIGH (ref 11.5–15.5)
WBC: 3.6 K/uL — ABNORMAL LOW (ref 4.0–10.5)

## 2017-08-15 LAB — BASIC METABOLIC PANEL WITH GFR
Anion gap: 15 (ref 5–15)
BUN: 53 mg/dL — ABNORMAL HIGH (ref 6–20)
CO2: 22 mmol/L (ref 22–32)
Calcium: 8.2 mg/dL — ABNORMAL LOW (ref 8.9–10.3)
Chloride: 100 mmol/L (ref 98–111)
Creatinine, Ser: 3.74 mg/dL — ABNORMAL HIGH (ref 0.44–1.00)
GFR calc Af Amer: 15 mL/min — ABNORMAL LOW
GFR calc non Af Amer: 13 mL/min — ABNORMAL LOW
Glucose, Bld: 96 mg/dL (ref 70–99)
Potassium: 4 mmol/L (ref 3.5–5.1)
Sodium: 137 mmol/L (ref 135–145)

## 2017-08-15 MED ORDER — PREDNISONE 5 MG PO TABS
5.0000 mg | ORAL_TABLET | Freq: Every day | ORAL | Status: DC
  Administered 2017-08-15 – 2017-08-16 (×2): 5 mg via ORAL
  Filled 2017-08-15 (×2): qty 1

## 2017-08-15 NOTE — Progress Notes (Signed)
Patient states she has requested repeatedly for her Tacrolimus to be ordered so that she receives it after food/breakfast.  I have placed Pharmacy request on the [facility] for this.

## 2017-08-15 NOTE — Progress Notes (Signed)
Family Medicine Teaching Service Daily Progress Note Intern Pager: [phone]  Patient name: Tina Watkins Medical record number: [id number] Date of birth: [birth year] Age: 52 y.o. Gender: female  Primary Care Provider: Donato Heinz, MD Consultants: Nephro Code Status: full  Pt Overview and Major Events to Date:  7/8 admitted  Assessment and Plan: Tina Watkins a 52 y.o.femalepresenting with fevers, cough, and abdominal pain. PMH is significant forkidney transplant, gastroparesis,chronic pain syndrome,gout, depression, anxiety, tobacco use  AKI - s/p renal transplant x3:improving; Cr 4.52 (7/13) > 3.74 (7/14)(baseline appears ~2.5) Patient with history of PSGN and ESRD with first renal transplant in 1989; 3rd transplant 2005. Nephro consulted - "AKI likely 2/2 hypotension, infxn, use of vancomycin; repeat UA with microscopy; does NOT believe to be acute rejection: Bladder scan, ultrasound transplant kidney, strict I&O" - Lasix held. Restart if hypervolemic. - Tacrolimus level (7/10): 9.6 (WNL 2.0-20.0) - avoid nephrotoxic agents - avoid NSAIDs - monitor BMP - DC Sodium-bicarb IVF (7/13) - Adjust med doses per GFR  - Imuran, Prografand Prednisone. She is currently on higher dose of prednisone for COPD.   CAP w/ COPD Patient has been seen in the ED recently, no hospitalizations. Patient did not fit criteria for HCAP. - d/c IV Vanc/Zosyn (7/9-7/11) - Moxifloxacin PO initiated (7/11-7/18) 8 days total  - prednisone 50x5days (7/9-7/13) then back to home 5mg  daily - blood cx (neg at Day 4)  Chest pressure Patient mention chest pressure this a.m. (7/13)  Pressure was substernal without radiation to extremities back or jaw.  Of note patient has a significant history of anxiety.   Chronicpain- chronic, unchanged,She is not a good candidate for NSAIDs due to her single functioning kidney and chronic kidney disease. -returned to home oxy 10q6 -tylenol  q6  Gastroparesis-stable Patient has a history of gastroparesis since 2010.Currently stable. Home medications include metoclopramide 5 mg twice daily. 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  Consistent with anemia due to renal insufficiency. -We will continue to monitor, will likely transfuse if continues to drop  Thrush:requested clotrimazole - Clotrimazole troche 5mg  PO 5 times daily  Tobacco use: smokes one half pack per day. Declines nicotine patch  GERD Patient takes omeprazole at home. -Continue as pantoprazole  Gout-stable am 7/10 Patient takes colchicine PRN at home. She feels like she might be getting a gout flair currently. - d/c colchicine  FEN/GI:renal diet Prophylaxis:heparin, aspirin  Disposition:To remain on MedSurg until the resolution of AKI  Subjective: Patient was seen this morning, resting in bed comfortably. She is feeling better today but is concerned that her steroid dose was decreased from 50 to 5mg . She claims both MetaTarsal Phalangeal joints are aching on the dorsal surface bilaterally, and is concerned for another gout attack. She also believes her right ear is stopped up. Otherwise she is in good spirits and denies, shortness of breath, chest pain, nausea, vomiting, abdominal pain, and diarrhea. She has good knowledge of her medical problems and treatment.  Physical Exam: General: Alert and cooperative and appears to be in no acute distress.  Patient was breathing comfortably on room air. HEENT: Neck non-tender without lymphadenopathy, masses or thyromegaly Ears: ear canals clear bilaterally; tympanic membranes without bulging bilaterally; Right ear canal has small clump of wax resting on TM, which appears mildly erythematous.  Cardio: Normal A1 and S2,  no S3 or  S4. Rhythm is regular.  3/6 systolic murmur best heard at the left sternal border.   Pulm: Clear to auscultation bilaterally, no crackles, wheezing, or diminished breath sounds. Normal respiratory effort Abdomen: Bowel sounds normal. Abdomen appeared slightly enlarged from previously, soft and tender to palpation.  Extremities: No peripheral edema. Warm/ well perfused.  Strong radial and pedal pulses. Neuro: Cranial nerves grossly intact  Objective: Temp:  [97.8 F (36.6 C)-98.4 F (36.9 C)] 98.1 F (36.7 C) (07/14 0800) Pulse Rate:  [57-61] 59 (07/14 0800) Resp:  [18-19] 18 (07/14 0800) BP: (96-107)/(55-65) 107/65 (07/14 0800) SpO2:  [96 %-99 %] 97 % (07/14 0800) Weight:  [120 lb 12.9 oz (54.8 kg)] 120 lb 12.9 oz (54.8 kg) (07/13 2035)   Laboratory: Recent Labs  Lab 08/13/17 0628 08/14/17 0545 08/15/17 0424  WBC 3.6* 3.7* 3.6*  HGB 7.3* 8.1* 8.4*  HCT 22.7* 25.6* 25.8*  PLT 270 292 273   Recent Labs  Lab 08/09/17 1612  08/13/17 0404 08/14/17 0545 08/15/17 0424  NA 137   < > 131* 136 137  K 4.3   < > 5.6* 3.8 4.0  CL 102   < > 101 96* 100  CO2 23   < > 18* 25 22  BUN 25*   < > 48* 52* 53*  CREATININE 2.95*   < > 4.36* 4.52* 3.74*  CALCIUM 9.5   < > 8.2* 8.2* 8.2*  PROT 7.2  --   --   --   --   BILITOT 0.7  --   --   --   --   ALKPHOS 123  --   --   --   --   ALT 8  --   --   --   --   AST 12*  --   --   --   --   GLUCOSE 95   < > 88 84 96   < > = values in this interval not displayed.   Legionella (7/8): negative Strep (7/8): negative Iron Studies (7/13): MCV normal, iron: 122 nml, TIBC: 140 (low), ferritin 1601 (elevated) Tacrolimus (08/11/2017): 9.6 nml UA (08/12/2017): Cloudy with 5 ketones, 30 protein; otherwise WNL UA (02/14/2017): unremarkable Ferritin: 1149 elevated (07/02/17) > 1,601 elevated (08/14/2017) Lactic Acid: 7.92 elevated (7/8) > 1.01 nml (7/8)  EKG (7/13): sinus brady otherwise normal Troponin (7/13): negative  Imaging/Diagnostic Tests: Ct  Abdomen Pelvis Wo Contrast 08/09/2017 Musculoskeletal: Coarsened trabecular markings compatible with end-stage renal disease.  IMPRESSION:  1. Multi lobar pneumonia.  2. Interstitial edema asymmetric to the right lower lobe.  3. No acute finding in the abdomen. 4. Transplant kidneys in the right lower quadrant, one failed and the other with unremarkable appearance.   Dg Chest 2 View 08/09/2017 IMPRESSION: No active cardiopulmonary disease.   Ct Chest Wo Contrast 08/10/2017 IMPRESSION:  1. Pneumonia involving the RIGHT middle lobe and BILATERAL lower lobes.  2. Associated small BILATERAL pleural effusions.  3. 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  4. Old granulomatous disease. Aortic Atherosclerosis (ICD10-I70.0).  US Renal Transplant W/doppler 08/12/2017 IMPRESSION: Normal resistive indices for the transplanted right lower quadrant kidney.  Daisy Floro, DO 08/15/2017, 8:28 AM PGY-1, [place] Intern pager: [phone], text pages welcome

## 2017-08-15 NOTE — Progress Notes (Signed)
[facility] Progress Note    Assessment/ Plan:   52y/o woman ESRD chronic GN on her 3rd transplant, LRRT 2005 BL cr 2-3 followed by Dr. Geanie Kenning w/ [facility] tx for PNA after p/w fever and cough.  1)Acute kidney injury on CKD likely due to hypotension, infection (pneumonia) and the use of vancomycin: The urinalysis done on admission unremarkable, however no microscopy was done. Does not appear to beacute rejection.Likely in ATN mult with hypotensive episodes and Vancomycin (vanco level  22 on 7/12 w/ last dose 7/10).  -Repeat urinalysis with microscopy-> minimal WBC and proteinuria. Would expect more WBC's and also tenderness over transplant kidney with rejection. No TTP over transplant kidney. -bladder scan-> only [name] transplant kidney-> no obstructive w/ nl echogen -strict I/O, no exact measurement of urine output done, d/w the patient.   - STOPPED the sodium bicarbonate IVF (pt getting overloaded) 7/13  -she is off of vancomycin now(last dose 7/10) -> 22 on 7/12. -Monitor BMP, avoid nephrotoxins. -please adjust the medication dose per GFR.  - Will f/u on tacrolimus trough (still may be from tac tox but lower on DDx) --> 9.6 on 7/10  - Fortunately renal function starting to improve; likely recovering ATN. Keep even for now and let her equilibrate.   2)h/o ESRD due to chronic GN s/p living related kidney transplantation in 2005: this is her 3rd kidney transplant and she is sensitized. Continue home immunosuppressants including imuran 100 mg daily, prograf 3 mg in am and 2 mg qpmand prednisone. She is currently on higher dose of prednisone for COPD. Follow up prograf level. -She follows with [facility] Littie Deeds at [facility].  3)Anemia of chronic disease: The iron sat was 28 % and ferritin 1149 on 07/02/2017. She received procrit on 08/06/17 at Short Stay. No sign of infection. Low Hb likely due to infection ? BM suppression.  - Gave  another dose of aranesp 4mcg  7/13; trying to avoid a blood transfusion bec of sensitization with the transplant.  4)Multifocal pneumonia: on Avelox. Off Vancomycin. Follow up culture results.    Subjective:   Dyspnea improved; also denies f/c. OOB and ambulating.  Bloating improved as well; mild abd discomfort but has had BM's.   Objective:   BP (!) 96/55 (BP Location: Left Arm)   Pulse (!) 57   Temp 97.8 F (36.6 C) (Oral)   Resp 18   Ht 4\' 11"  (1.499 m)   Wt 54.8 kg (120 lb 12.9 oz)   SpO2 97%   BMI 24.40 kg/m   Intake/Output Summary (Last 24 hours) at 08/15/2017 0746 Last data filed at 08/15/2017 0600 Gross per 24 hour  Intake 840 ml  Output 100 ml  Net 740 ml   Weight change: 0.003 kg (0.1 oz)  Physical Exam: General exam:Calm and comfortable; no overt distress Respiratory system: Clear to auscultation. Respiratory effort normal. No wheezing or crackle Cardiovascular system:S1 &S2 heard, RRR.Trpedal edema. Gastrointestinal system:Abdomen is nondistended, soft and nontender. Normal bowel sounds heard.No TTP over transplanted kidney on RLQ Central nervous system:Alert and oriented. No focal neurological deficits. Extremities: Symmetric 5 x 5 power. Skin: No rashes, lesions or ulcers    Imaging: No results found.  Labs: BMET Recent Labs  Lab 08/09/17 1612 08/10/17 0417 08/11/17 0531 08/12/17 0527 08/13/17 0404 08/14/17 0545 08/15/17 0424  NA 137 136 135 133* 131* 136 137  K 4.3 4.3 5.1 5.2* 5.6* 3.8 4.0  CL 102 106 107 103 101 96* 100  CO2 23 16* 19* 16*  18* 25 22  GLUCOSE 95 118* 112* 89 88 84 96  BUN 25* 24* 28* 38* 48* 52* 53*  CREATININE 2.95* 2.57* 2.65* 3.89* 4.36* 4.52* 3.74*  CALCIUM 9.5 8.5* 8.9 8.8* 8.2* 8.2* 8.2*   CBC Recent Labs  Lab 08/12/17 0527 08/13/17 0628 08/14/17 0545 08/15/17 0424  WBC 4.7 3.6* 3.7* 3.6*  HGB 7.3* 7.3* 8.1* 8.4*  HCT 24.0* 22.7* 25.6* 25.8*  MCV 98.8 93.4 94.8 92.5  PLT 257 270 292 273    Medications:    . aspirin  EC  81 mg Oral Daily  . azaTHIOprine  100 mg Oral Daily  . clotrimazole  10 mg Oral 5 X Daily  . fluticasone  1 spray Each Nare Daily  . heparin  5,000 Units Subcutaneous Q8H  . latanoprost  1 drop Left Eye QHS  . metoCLOPramide  5 mg Oral BID  . moxifloxacin  400 mg Oral Q24H  . nicotine  14 mg Transdermal Daily  . pantoprazole  40 mg Oral Daily  . predniSONE  5 mg Oral Q breakfast  . tacrolimus  2 mg Oral QPM  . tacrolimus  3 mg Oral Q0600      Otelia Santee, MD 08/15/2017, 7:46 AM

## 2017-08-15 NOTE — Progress Notes (Addendum)
Family Medicine Teaching Service Daily Progress Note Intern Pager: [phone]  Patient name: Tina Watkins Medical record number: [id number] Date of birth: [birth year] Age: 52 y.o. Gender: female  Primary Care Provider: Donato Heinz, MD Consultants: Nephro Code Status: full  Pt Overview and Major Events to Date:  7/8 admitted  Assessment and Plan: Tina Watkins a 52 y.o.femalepresenting with fevers, cough, and abdominal pain. PMH is significant forkidney transplant, gastroparesis,chronic pain syndrome,gout, depression, anxiety, tobacco use  AKI- s/p renal transplant x3:improving; Cr 4.52(7/13) > 3.74 (7/14)(baseline appears ~2.5) Patient with history of PSGN and ESRD with first renal transplant in 1989; 3rd transplant 2005. Nephro consulted - "AKI likely 2/2 hypotension, infxn, use of vancomycin; repeat UA with microscopy; does NOT believe to be acute rejection: Bladder scan, ultrasound transplant kidney, strict I&O" -Lasix held. Restart if hypervolemic. - Tacrolimus level (7/10):9.6 (WNL 2.0-20.0) - avoid nephrotoxic agents - avoid NSAIDs - monitor BMP -Sodium-bicarb d/c'ed (7/13) - Adjust med doses per GFR  - Imuran, Prografand Prednisone (down 50mg  qD to 5mg qD 7/14).   Gout-stable am 7/10 Patient takes colchicine PRN at home. She feels like she might be getting a gout flair currently (7/15). - d/c colchicine - restart steroids 40 PO (7/15)  CAPw/ COPD Patient has been seen in the ED recently,no hospitalizations. Patient did not fit criteria for HCAP. - d/c IV Vanc/Zosyn (7/9-7/11) - Moxifloxacin PO initiated (7/11-7/18)8 days total - prednisone 50x5days (7/9-7/13) then back to home 5mg  daily - restart prednisone 40mg  7/15 - blood cx (neg at [facility])  Chest pressure Patient mention chest pressure this a.m.(7/13)Pressure was substernal without radiation to extremities back or jaw. Of note patient has a significant history of  anxiety.  Chronicpain- chronic, unchanged,She is not a good candidate for NSAIDs due to her single functioning kidney and chronic kidney disease. -returned to home oxy 10q6 -tylenol q6  Gastroparesis-stable Patient has a history of gastroparesis since 2010. Home medications include metoclopramide 5 mg twice daily. -ContinueReglan  Anemia of Chronic Disease, 2/2 ESRD: 2/2 PSGN; Hgb 8.4 on admission (7/8) > 7.7 > 7.3 (7/12)->8.1 (7/13) > 8.4 (7/14); Baseline Hgb ~ 9.5.MCV normal, iron: 122, TIBC: 140 (low), ferritin 1601.Consistent with anemia due to renal insufficiency. -We will continue to monitor, will likely transfuse if continues to drop  Thrush:requested clotrimazole - Clotrimazole troche 5mg  PO 5 times daily  Tobacco use: smokes one half pack per day.Declines nicotine patch  GERD Patient takes omeprazole at home. -Continue as pantoprazole  FEN/GI:renal diet Prophylaxis:heparin, aspirin  Disposition:To remain on MedSurg until the resolution of AKI  Subjective: Patient was seen this morning, resting in bed comfortably. She is feeling better today but is concerned that her steroid dose was decreased from 50 to 5mg . She claims both MetaTarsal Phalangeal joints are aching on the dorsal surface bilaterally, and is concerned for another gout attack. She also believes her right ear is stopped up. Otherwise she is in good spirits and denies, shortness of breath, chest pain, nausea, vomiting, abdominal pain, and diarrhea. She has good knowledge of her medical problems and treatment.  Objective: Temp:  [97.8 F (36.6 C)-98.4 F (36.9 C)] 98.4 F (36.9 C) (07/14 2031) Pulse Rate:  [57-62] 62 (07/14 2031) Resp:  [16-18] 16 (07/14 2031) BP: (91-107)/(55-65) 91/55 (07/14 2031) SpO2:  [97 %] 97 % (07/14 2031) Physical Exam  Constitutional: She is oriented to person, place, and time. She appears well-developed and well-nourished.  HENT:  Head: Normocephalic and  atraumatic.  Eyes: EOM are normal.  Neck:  Normal range of motion. Neck supple.  Cardiovascular: Normal rate, intact distal pulses and normal pulses. An irregularly irregular rhythm present.  Pulmonary/Chest: Effort normal and breath sounds normal. No accessory muscle usage or stridor. No tachypnea. No respiratory distress.  Abdominal: Soft. Bowel sounds are normal.  Musculoskeletal: Normal range of motion.  Increased pain with movement in bilateral MTPs 2/2 Gout. Without erythema, swelling.  Neurological: She is alert and oriented to person, place, and time.  Skin: Skin is warm and dry. Capillary refill takes less than 2 seconds.  Psychiatric: She has a normal mood and affect. She is agitated.  Agitation due to pain in her toes bilaterally.    General: Alert and cooperative and appears to be in no acute distress. Patient was breathing comfortably on room air. HEENT: Neck non-tender without lymphadenopathy, masses or thyromegaly Ears: ear canals clear bilaterally; tympanic membranes without bulging bilaterally; Right ear canal has small clump of wax resting on TM, which appears mildly erythematous.  Cardio: Normal A1 and S2, no S3 or S4. Rhythm is regular.3/6 systolic murmur best heard at the left sternal border.  Pulm: Clear to auscultation bilaterally, no crackles, wheezing, or diminished breath sounds. Normal respiratory effort Abdomen: Bowel sounds normal. Abdomenappeared slightly enlarged from previously,soft and tender to palpation.  Extremities: No peripheral edema. Warm/ well perfused. Strong radial and pedal pulses. Neuro: Cranial nerves grossly intact  Laboratory: CBC Latest Ref Rng & Units 08/15/2017 08/14/2017 08/13/2017  WBC 4.0 - 10.5 K/uL 3.6(L) 3.7(L) 3.6(L)  Hemoglobin 12.0 - 15.0 g/dL 8.4(L) 8.1(L) 7.3(L)  Hematocrit 36.0 - 46.0 % 25.8(L) 25.6(L) 22.7(L)  Platelets 150 - 400 K/uL 273 292 270   CMP Latest Ref Rng & Units 08/16/2017 08/15/2017 08/14/2017  Glucose 70 -  99 mg/dL 88 96 84  BUN 6 - 20 mg/dL 49(H) 53(H) 52(H)  Creatinine 0.44 - 1.00 mg/dL 3.52(H) 3.74(H) 4.52(H)  Sodium 135 - 145 mmol/L 141 137 136  Potassium 3.5 - 5.1 mmol/L 3.2(L) 4.0 3.8  Chloride 98 - 111 mmol/L 103 100 96(L)  CO2 22 - 32 mmol/L 27 22 25   Calcium 8.9 - 10.3 mg/dL 8.3(L) 8.2(L) 8.2(L)  Total Protein 6.5 - 8.1 g/dL - - -  Total Bilirubin 0.3 - 1.2 mg/dL - - -  Alkaline Phos 38 - 126 U/L - - -  AST 15 - 41 U/L - - -  ALT 0 - 44 U/L - - -   Legionella (7/8): negative Strep (7/8): negative Iron Studies (7/13): MCV normal, iron: 122 nml, TIBC: 140 (low), ferritin 1601 (elevated) Tacrolimus (08/11/2017): 9.6 nml UA (08/12/2017): Cloudy with 5 ketones, 30 protein; otherwise WNL UA (02/14/2017): unremarkable Ferritin: 1149 elevated (07/02/17) > 1,601 elevated (08/14/2017) Lactic Acid: 7.92 elevated (7/8) > 1.01 nml (7/8)  EKG (7/13): sinus brady otherwise normal Troponin (7/13): negative  Imaging/Diagnostic Tests: Ct Abdomen Pelvis Wo Contrast 08/09/2017 Musculoskeletal: Coarsened trabecular markings compatible with end-stage renal disease.  IMPRESSION:  1. Multi lobar pneumonia.  2. Interstitial edema asymmetric to the right lower lobe.  3. No acute finding in the abdomen. 4. Transplant kidneys in the right lower quadrant, one failed and the other with unremarkable appearance.   Dg Chest 2 View 08/09/2017 IMPRESSION: No active cardiopulmonary disease.   Ct Chest Wo Contrast 08/10/2017 IMPRESSION:  1. Pneumonia involving the RIGHT middle lobe and BILATERAL lower lobes.  2. Associated small BILATERAL pleural effusions.  3. Chronic SVC occlusion with markedly dilated azygos and hemiazygous veins in the mediastinum and in the retrocrural space  of the upper abdomen.  4. Old granulomatous disease. Aortic Atherosclerosis (ICD10-I70.0).  US Renal Transplant W/doppler 08/12/2017 IMPRESSION: Normal resistive indices for the transplanted right lower quadrant kidney.  Milus Banister, [place], PGY-1 08/16/2017 9:36 AM FPTS Intern pager: [phone], text pages welcome

## 2017-08-16 DIAGNOSIS — M1A3720 Chronic gout due to renal impairment, left ankle and foot, without tophus (tophi): Secondary | ICD-10-CM

## 2017-08-16 LAB — RENAL FUNCTION PANEL
Albumin: 3.1 g/dL — ABNORMAL LOW (ref 3.5–5.0)
Anion gap: 11 (ref 5–15)
BUN: 49 mg/dL — ABNORMAL HIGH (ref 6–20)
CO2: 27 mmol/L (ref 22–32)
Calcium: 8.3 mg/dL — ABNORMAL LOW (ref 8.9–10.3)
Chloride: 103 mmol/L (ref 98–111)
Creatinine, Ser: 3.52 mg/dL — ABNORMAL HIGH (ref 0.44–1.00)
GFR calc Af Amer: 16 mL/min — ABNORMAL LOW (ref >60)
GFR calc non Af Amer: 14 mL/min — ABNORMAL LOW (ref >60)
Glucose, Bld: 88 mg/dL (ref 70–99)
Phosphorus: 4.7 mg/dL — ABNORMAL HIGH (ref 2.5–4.6)
Potassium: 3.2 mmol/L — ABNORMAL LOW (ref 3.5–5.1)
Sodium: 141 mmol/L (ref 135–145)

## 2017-08-16 MED ORDER — PREDNISONE 5 MG PO TABS
35.0000 mg | ORAL_TABLET | Freq: Once | ORAL | Status: AC
  Administered 2017-08-16: 35 mg via ORAL
  Filled 2017-08-16: qty 1

## 2017-08-16 MED ORDER — ACETAMINOPHEN 325 MG PO TABS
650.0000 mg | ORAL_TABLET | Freq: Four times a day (QID) | ORAL | Status: DC
  Administered 2017-08-16 – 2017-08-17 (×5): 650 mg via ORAL
  Filled 2017-08-16 (×5): qty 2

## 2017-08-16 MED ORDER — PREDNISONE 20 MG PO TABS
40.0000 mg | ORAL_TABLET | Freq: Every day | ORAL | Status: DC
  Administered 2017-08-17: 40 mg via ORAL
  Filled 2017-08-16: qty 2

## 2017-08-16 NOTE — Progress Notes (Signed)
[facility] ROUNDING NOTE   Subjective:   Interval History: has complaints lack of rapid improvement in renal function. It was explained that this is a slow process that should be completed gently given her low blood pressure and current renal function. She denied other complaints and stated that overall she felt well except for the pain from her gout.   Objective:  Vital signs in last 24 hours:  Temp:  [98 F (36.7 C)-98.4 F (36.9 C)] 98 F (36.7 C) (07/15 0506) Pulse Rate:  [58-62] 58 (07/15 0506) Resp:  [16-19] 19 (07/15 0506) BP: (90-93)/(54-56) 90/54 (07/15 0506) SpO2:  [97 %-98 %] 98 % (07/15 0506) Weight:  [120 lb 12.9 oz (54.8 kg)] 120 lb 12.9 oz (54.8 kg) (07/14 2031)  Weight change: 0 lb (0 kg) Filed Weights   08/13/17 2256 08/14/17 2035 08/15/17 2031  Weight: 120 lb 12.8 oz (54.8 kg) 120 lb 12.9 oz (54.8 kg) 120 lb 12.9 oz (54.8 kg)   Intake/Output: I/O last 3 completed shifts: In: 1240 [P.O.:1240] Out: 300 [Urine:300]   Intake/Output this shift:  No intake/output data recorded.  Gen-A&O x 4, in no acute distress, afebrile, nondiaphoretic CVS- RRR, sys murmur RS- CTA bilaterally  ABD- BS present soft non-distended, nontender EXT- no edema bilaterally   Basic Metabolic Panel: Recent Labs  Lab 08/12/17 0527 08/13/17 0404 08/14/17 0545 08/15/17 0424 08/16/17 0344  NA 133* 131* 136 137 141  K 5.2* 5.6* 3.8 4.0 3.2*  CL 103 101 96* 100 103  CO2 16* 18* 25 22 27   GLUCOSE 89 88 84 96 88  BUN 38* 48* 52* 53* 49*  CREATININE 3.89* 4.36* 4.52* 3.74* 3.52*  CALCIUM 8.8* 8.2* 8.2* 8.2* 8.3*  PHOS  --   --   --   --  4.7*    Liver Function Tests: Recent Labs  Lab 08/09/17 1612 08/16/17 0344  AST 12*  --   ALT 8  --   ALKPHOS 123  --   BILITOT 0.7  --   PROT 7.2  --   ALBUMIN 3.8 3.1*   No results for input(s): LIPASE, AMYLASE in the last 168 hours. No results for input(s): AMMONIA in the last 168 hours.  CBC: Recent Labs  Lab  08/11/17 1023 08/12/17 0527 08/13/17 0628 08/14/17 0545 08/15/17 0424  WBC 5.2 4.7 3.6* 3.7* 3.6*  HGB 7.7* 7.3* 7.3* 8.1* 8.4*  HCT 24.5* 24.0* 22.7* 25.6* 25.8*  MCV 98.0 98.8 93.4 94.8 92.5  PLT 242 257 270 292 273    Cardiac Enzymes: Recent Labs  Lab 08/14/17 0545  TROPONINI <0.03    BNP: Invalid input(s): POCBNP  CBG: No results for input(s): GLUCAP in the last 168 hours.  Microbiology: Results for orders placed or performed during the hospital encounter of 08/09/17  Blood Culture (routine x 2)     Status: None   Collection Time: 08/09/17  4:12 PM  Result Value Ref Range Status   Specimen Description BLOOD RIGHT ANTECUBITAL  Final   Special Requests   Final    BOTTLES DRAWN AEROBIC AND ANAEROBIC Blood Culture results may not be optimal due to an inadequate volume of blood received in culture bottles   Culture   Final    NO GROWTH 5 DAYS Performed at [facility], [place] [address] [place], [place] [address]    Report Status 08/14/2017 FINAL  Final  Blood Culture (routine x 2)     Status: None   Collection Time: 08/09/17  4:18 PM  Result Value Ref Range Status   Specimen Description BLOOD RIGHT WRIST  Final   Special Requests   Final    BOTTLES DRAWN AEROBIC AND ANAEROBIC Blood Culture adequate volume   Culture   Final    NO GROWTH 5 DAYS Performed at [facility], [address], [place], [place] [address]    Report Status 08/14/2017 FINAL  Final    Coagulation Studies: No results for input(s): LABPROT, INR in the last 72 hours.  Urinalysis: Recent Labs    08/14/17 1312  COLORURINE YELLOW  LABSPEC 1.009  PHURINE 5.0  GLUCOSEU NEGATIVE  HGBUR NEGATIVE  BILIRUBINUR NEGATIVE  KETONESUR NEGATIVE  PROTEINUR NEGATIVE  NITRITE NEGATIVE  LEUKOCYTESUR NEGATIVE      Imaging: No results found.   Medications:    . aspirin EC  81 mg Oral Daily  . azaTHIOprine  100 mg Oral Daily  . clotrimazole  10 mg Oral 5 X Daily  . fluticasone  1  spray Each Nare Daily  . heparin  5,000 Units Subcutaneous Q8H  . latanoprost  1 drop Left Eye QHS  . metoCLOPramide  5 mg Oral BID  . moxifloxacin  400 mg Oral Q24H  . nicotine  14 mg Transdermal Daily  . pantoprazole  40 mg Oral Daily  . predniSONE  5 mg Oral Q breakfast  . tacrolimus  2 mg Oral QPM  . tacrolimus  3 mg Oral Q0600   acetaminophen, albuterol, diphenhydrAMINE, guaiFENesin-dextromethorphan, ondansetron, [DISCONTINUED] oxyCODONE-acetaminophen **AND** oxyCODONE  Assessment/ Plan:  Tina Watkins is a 52 yo F w/ a PMHx notable for ESRD s/p renal transplant x 3 w/ CKD III, gastroparesis, chronic pain syndrome, gout, MDD, MAD, tobacco use disorder, who presented with cough and pelvic pain. She was treated for CAP with O2 via [place] and vancomycin. She developed an acute renal injury on CKD most likely from chronic hypotension and vancomycin toxicity and felt unlikely to be ATN or acute rejection. UA w/ microscopy w/ minimal WBC and proteinuria not consistent with rejection.    Acute renal injury-Felt to be multifactorial ATN on CKD 2/2 hypotension 2/2 pneumonia and vancomycin toxicity. Monitoring with daily labs and BP for improvement. Avoid nephrotoxic agents. Continue tacrolimus, azathioprine and prednisone 5mg  daily for her third renal transplant.  ANEMIA-Hgb stable at 8.4 this am. Given dose of aranesp 60mcg on 7/13. Continue to monitor. Transfusion goal >7.0.   HTN/VOL-Patient w/ borderline hypotension 90/60's MAP ~75. Volume up +4.8L's. Patient states that she is chronically hypotensive near a systolic of 90.  GOUT-Okay to treat as per primary team   LOS: 7 Kathi Ludwig, MD Internal Medicine Resident, PGY-2  Renal Attending: She has AKI on chronic dysfunction in 3rd renal allograft.  She has been treated for PNA with improvement in symptoms and slow recovery of renal fct.  Will check prograf level in AM. Estanislado Emms, MD

## 2017-08-16 NOTE — Progress Notes (Signed)
PT Cancellation Note  Patient Details Name: Tina Watkins MRN: [id number] DOB: 10-14-1965   Cancelled Treatment:    Reason Eval/Treat Not Completed: Other (comment)   Politely declining PT, just got back from walking to the nurses' station and back;   Will follow up later today as time allows;  Otherwise, will follow up for PT at a later date;   Thank you,  Roney Marion, PT  Acute Rehabilitation Services Pager [phone] Office [phone]     Colletta [place] 08/16/2017, 11:43 AM

## 2017-08-16 NOTE — Progress Notes (Signed)
OT Note Addendum for Charges    08/16/17 1625  OT Visit Information  Last OT Received On 08/16/17  OT General Charges  $OT Visit 1 Visit  OT Treatments  $Self Care/Home Management  8-22 mins   Mel Almond A. Ulice Brilliant, M.S., OTR/L Acute Rehab Department: [phone]

## 2017-08-16 NOTE — Progress Notes (Signed)
Family Medicine Teaching Service Daily Progress Note Intern Pager: [phone]  Patient name: Tina Watkins Medical record number: [id number] Date of birth: 03/08/1965 Age: 52 y.o. Gender: female  Primary Care Provider: Donato Heinz, MD Consultants: Nephro Code Status: full  Pt Overview and Major Events to Date:  7/8 admitted  Assessment and Plan: Tina Watkins a 52 y.o.femalepresenting with fevers, cough, and abdominal pain. PMH is significant forkidney transplant, gastroparesis,chronic pain syndrome,gout, depression, anxiety, tobacco use  AKI- s/p renal transplant x3:improving; Cr4.52(7/13)> 3.74 (7/14)> 3.5 (7/15) > 3.41 (7/16) (baseline appears ~2.5) - Nephro consulted - "AKI likely 2/2 hypotension, infxn, use of vancomycin; repeat UA with microscopy; doesNOTbelieve to be acute rejection: Bladder scan, ultrasound transplant kidney, strict I&O" -Lasix held. Restart if hypervolemic. - Tacrolimus level (7/10):9.6 (WNL 2.0-20.0) - avoid nephrotoxic agents - avoid NSAIDs - monitor BMP -Sodium-bicarb d/c'ed (7/13) - Adjust med doses per GFR  - Imuran, Prografand Prednisone (down 50mg  qD to 5mg qD 7/14).   Gout-stable am 7/10 Patient takes colchicine PRN at home. Gout flair returned 7/15, improvement 7/16. - d/c colchicine - restarted steroids 40 PO (7/15)  CAPw/ COPD Patient has been seen in the ED recently,no hospitalizations. Patient did not fit criteria for HCAP. - d/c IV Vanc/Zosyn (7/9-7/11) - Moxifloxacin course completed (7/11-7/18)8 days total - prednisone 50x5days (7/9-7/13) then back to home 5mg  daily - restart prednisone 40mg  7/15 for gout - blood cx (neg at [facility])  Chest pressure No mention of chest pain or pressure 7/16.Patient has a significant history of anxiety.  Chronicpain- chronic, unchanged,She is not a good candidate for NSAIDs due to her single functioning kidney and chronic kidney disease. - home oxy 10q6 -  tylenol q6  Gastroparesis-stable Patient has a history of gastroparesis since 2010. Home medications include metoclopramide 5 mg twice daily. -ContinueReglan  Anemia of Chronic Disease, 2/2 ESRD: 2/2 PSGN; Hgb 8.4 on admission (7/8) > 7.7 > 7.3 (7/12)->8.1 (7/13) >8.4 (7/14); Baseline Hgb ~ 9.5.MCV normal, iron: 122, TIBC: 140 (low), ferritin 1601.Consistent with anemia due to renal insufficiency. -Monitoring  Thrush:requested clotrimazole -Clotrimazole troche5mg  PO 5 times daily  Tobacco use: smokes one half pack per day.Declines nicotine patch  GERD Patient takes omeprazole at home. -Continue as pantoprazole  FEN/GI:renal diet Prophylaxis:heparin, aspirin  Disposition:To remain on MedSurg until the resolution of AKI  Subjective: Patient was seen this morning, standing in her room preparing to ambulate it she is standing and walking well, reports her feet feel better since the steroids yesterday.She is feeling better today, is in good spirits, and denies shortness of breath, chest pain, nausea, vomiting, abdominal pain, and diarrhea. Shehas good knowledge of her medical problems and treatment. She is asking when she can go home.  Objective: Vitals:   08/17/17 0122 08/17/17 0525  BP: (!) 89/52 107/64  Pulse: 66 66  Resp:  14  Temp:  98.4 F (36.9 C)  SpO2: 99% 97%   Physical Exam  Constitutional: She is oriented to person, place, and time. She appears well-developed and well-nourished.  HENT:  Head: Normocephalic and atraumatic.  Eyes: EOM are normal.  Neck: Normal range of motion. Neck supple.  Cardiovascular: Normal rate, intact distal pulses and normal pulses. Pulmonary/Chest: Effort normal and breath sounds normal. No accessory muscle usage or stridor. No tachypnea. No respiratory distress.  Improved from previous exams Abdominal: Soft. Bowel sounds are normal.  Musculoskeletal: Normal range of motion. MTP pain approved from yesterday  7/16 Neurological: She is alert and oriented to person, place, and time.  Skin: Skin is warm and dry. Capillary refill takes less than 2 seconds.  Psychiatric: She has a normal mood and affect.   Laboratory: CBC Latest Ref Rng & Units 08/15/2017 08/14/2017 08/13/2017  WBC 4.0 - 10.5 K/uL 3.6(L) 3.7(L) 3.6(L)  Hemoglobin 12.0 - 15.0 g/dL 8.4(L) 8.1(L) 7.3(L)  Hematocrit 36.0 - 46.0 % 25.8(L) 25.6(L) 22.7(L)  Platelets 150 - 400 K/uL 273 292 270   CMP Latest Ref Rng & Units 08/17/2017 08/16/2017 08/15/2017  Glucose 70 - 99 mg/dL 112(H) 88 96  BUN 6 - 20 mg/dL 44(H) 49(H) 53(H)  Creatinine 0.44 - 1.00 mg/dL 3.41(H) 3.52(H) 3.74(H)  Sodium 135 - 145 mmol/L 139 141 137  Potassium 3.5 - 5.1 mmol/L 4.3 3.2(L) 4.0  Chloride 98 - 111 mmol/L 103 103 100  CO2 22 - 32 mmol/L 23 27 22   Calcium 8.9 - 10.3 mg/dL 8.5(L) 8.3(L) 8.2(L)  Total Protein 6.5 - 8.1 g/dL - - -  Total Bilirubin 0.3 - 1.2 mg/dL - - -  Alkaline Phos 38 - 126 U/L - - -  AST 15 - 41 U/L - - -  ALT 0 - 44 U/L - - -   Legionella (7/8): negative Strep (7/8): negative Iron Studies (7/13):MCV normal, iron: 122nml, TIBC: 140 (low), ferritin 1601(elevated) Tacrolimus (08/11/2017): 9.6 nml UA (08/12/2017): Cloudy with 5 ketones, 30 protein; otherwise WNL UA (02/14/2017): unremarkable Ferritin: 1149 elevated (07/02/17) > 1,601 elevated (08/14/2017) Lactic Acid: 7.92 elevated (7/8) > 1.01 nml (7/8)  EKG(7/13): sinus brady otherwise normal Troponin (7/13): negative  Imaging/Diagnostic Tests: Ct Abdomen Pelvis Wo Contrast 08/09/2017 Musculoskeletal: Coarsened trabecular markings compatible with end-stage renal disease.  IMPRESSION:  1. Multi lobar pneumonia.  2. Interstitial edema asymmetric to the right lower lobe.  3. No acute finding in the abdomen. 4. Transplant kidneys in the right lower quadrant, one failed and the other with unremarkable appearance.   Dg Chest 2 View 08/09/2017 IMPRESSION: No active cardiopulmonary disease.    Ct Chest Wo Contrast 08/10/2017 IMPRESSION:  1. Pneumonia involving the RIGHT middle lobe and BILATERAL lower lobes.  2. Associated small BILATERAL pleural effusions.  3. 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  4. Old granulomatous disease. Aortic Atherosclerosis (ICD10-I70.0).  US Renal Transplant W/doppler 08/12/2017 IMPRESSION: Normal resistive indices for the transplanted right lower quadrant kidney.  Milus Banister, [place], PGY-1 08/17/2017 6:58 AM FPTS Intern pager: [phone], text pages welcome

## 2017-08-16 NOTE — Progress Notes (Signed)
Occupational Therapy Treatment Patient Details Name: Tina Watkins MRN: [id number] DOB: [birth year] Today's Date: 08/16/2017    History of present illness Pt is a 52 y.o. F wih significant PMH of kidney transplant, gastroparesis, chronic pain syndrome, gout, depression, anxiety, tobacco use. Presenting with fevers, cough, and abdominal pain. CT chest significant for multilobar pneumonia and interstitial edema primarily in the right lobe.    OT comments  Session this date focused on problem solving through scenarios with a focus on energy conservation strategies to complete ADLs/IADLs secondary to pt politely declining OOB mobility. Pt required consistent prompting and cues to identify energy conservation strategies. Pt reported her main goal is to increase strength to be able to complete laundry, cleaning, and showering at home. Pt will continue to benefit from acute OT to maximize pt's independence and safety with ADLs and functional mobility to facilitate safe d/c home. Continue to recommend HHOT follow-up.    Follow Up Recommendations  Home health OT;Other (comment)    Equipment Recommendations  Tub/shower seat    Recommendations for Other Services      Precautions / Restrictions Precautions Precautions: Fall Restrictions Weight Bearing Restrictions: No       Mobility Bed Mobility                  Transfers                      Balance                                           ADL either performed or assessed with clinical judgement   ADL Overall ADL's : Needs assistance/impaired                                       General ADL Comments: problem solved strategies for energy conservation during ADLs;continued to reinforce pt on energy conservation strategies;prompted pt to initiate problem solving and planning ways to conserve energy during ADLs     Vision   Additional Comments: blind R eye   Perception      Praxis      Cognition Arousal/Alertness: Awake/alert Behavior During Therapy: WFL for tasks assessed/performed Overall Cognitive Status: Within Functional Limits for tasks assessed                                 General Comments: pt required consistent VC and prompting to problem solve energy conservation strategies to utilize at home        Exercises     Shoulder Instructions       General Comments pt politely declined OOB mobility secondary to pain;pt discussed difficulty with all ADLs prior to admission and that her main goal is to increase strength and endurance to complete ADLs/IADLs particularly cooking, cleaning, and laundry;pt plans to read energy conservation handout this afternoon, she reports she has not read through it yet.    Pertinent Vitals/ Pain       Pain Assessment: 0-10 Pain Score: 5  Pain Location: Chronic upper back pain, L foot pain from gout Pain Descriptors / Indicators: Aching;Sore Pain Intervention(s): Limited activity within patient's tolerance;Monitored during session  Home Living  Prior Functioning/Environment              Frequency  Min 2X/week        Progress Toward Goals  OT Goals(current goals can now be found in the care plan section)  Progress towards OT goals: Progressing toward goals  Acute Rehab OT Goals Patient Stated Goal: to increase strength and endurance OT Goal Formulation: With patient Time For Goal Achievement: 08/25/17 Potential to Achieve Goals: Good ADL Goals Pt Will Perform Grooming: with set-up;standing Pt Will Perform Lower Body Bathing: with supervision;with set-up;with modified independence;sitting/lateral leans;sit to/from stand Pt Will Perform Lower Body Dressing: with supervision;with set-up;with modified independence;sitting/lateral leans;sit to/from stand Pt Will Transfer to Toilet: Independently Pt Will Perform Tub/Shower  Transfer: Independently;Shower transfer;ambulating Additional ADL Goal #1: Pt will verbalize and demonstrate 3 energy conservation techniques during ADLs and ADL mobility  Plan Discharge plan remains appropriate    Co-evaluation                 AM-PAC PT "6 Clicks" Daily Activity     Outcome Measure   Help from another person eating meals?: None Help from another person taking care of personal grooming?: None Help from another person toileting, which includes using toliet, bedpan, or urinal?: None Help from another person bathing (including washing, rinsing, drying)?: None Help from another person to put on and taking off regular upper body clothing?: None Help from another person to put on and taking off regular lower body clothing?: None 6 Click Score: 24    End of Session    OT Visit Diagnosis: Unsteadiness on feet (R26.81);Muscle weakness (generalized) (M62.81);Pain Pain - Right/Left: Right Pain - part of body: Ankle and joints of foot   Activity Tolerance Patient limited by pain   Patient Left in bed;with call bell/phone within reach;with bed alarm set   Nurse Communication Mobility status        Time: 5465-6812 OT Time Calculation (min): 18 min  Charges:    Dorinda Hill OTS     Dorinda Hill 08/16/2017, 4:23 PM

## 2017-08-17 ENCOUNTER — Other Ambulatory Visit: Payer: Self-pay | Admitting: Family Medicine

## 2017-08-17 LAB — BASIC METABOLIC PANEL
Anion gap: 13 (ref 5–15)
BUN: 44 mg/dL — ABNORMAL HIGH (ref 6–20)
CO2: 23 mmol/L (ref 22–32)
Calcium: 8.5 mg/dL — ABNORMAL LOW (ref 8.9–10.3)
Chloride: 103 mmol/L (ref 98–111)
Creatinine, Ser: 3.41 mg/dL — ABNORMAL HIGH (ref 0.44–1.00)
GFR calc Af Amer: 17 mL/min — ABNORMAL LOW (ref >60)
GFR calc non Af Amer: 15 mL/min — ABNORMAL LOW (ref >60)
Glucose, Bld: 112 mg/dL — ABNORMAL HIGH (ref 70–99)
Potassium: 4.3 mmol/L (ref 3.5–5.1)
Sodium: 139 mmol/L (ref 135–145)

## 2017-08-17 MED ORDER — NICOTINE 14 MG/24HR TD PT24: 14.0000 mg | MEDICATED_PATCH | TRANSDERMAL | 0 refills | Status: AC

## 2017-08-17 MED ORDER — OMEPRAZOLE 40 MG PO CPDR: 40.0000 mg | DELAYED_RELEASE_CAPSULE | Freq: Every day | ORAL | 0 refills | Status: DC

## 2017-08-17 MED ORDER — DARBEPOETIN ALFA 100 MCG/0.5ML IJ SOSY
100.0000 ug | PREFILLED_SYRINGE | Freq: Once | INTRAMUSCULAR | Status: DC
  Filled 2017-08-17: qty 0.5

## 2017-08-17 MED ORDER — PREDNISONE 20 MG PO TABS: 40.0000 mg | ORAL_TABLET | Freq: Every day | ORAL | 0 refills | Status: DC

## 2017-08-17 NOTE — Progress Notes (Addendum)
[facility] ROUNDING NOTE   Subjective:   Interval History: has no complaint this am. She stated that she would like to be cleared for discharge so that she may make her flight to [place] where she can have labs drawn at the [facility], the location of her most recent renal transplant.  Objective:  Vital signs in last 24 hours:  Temp:  [98.2 F (36.8 C)-98.5 F (36.9 C)] 98.4 F (36.9 C) (07/16 0525) Pulse Rate:  [60-72] 66 (07/16 0525) Resp:  [14-19] 14 (07/16 0525) BP: (81-107)/(50-64) 107/64 (07/16 0525) SpO2:  [95 %-99 %] 97 % (07/16 0525) Weight:  [112 lb 10.5 oz (51.1 kg)] 112 lb 10.5 oz (51.1 kg) (07/16 0525)  Weight change: -8 lb 2.4 oz (-3.697 kg) Filed Weights   08/14/17 2035 08/15/17 2031 08/17/17 0525  Weight: 120 lb 12.9 oz (54.8 kg) 120 lb 12.9 oz (54.8 kg) 112 lb 10.5 oz (51.1 kg)    Intake/Output: I/O last 3 completed shifts: In: 1360 [P.O.:1360] Out: 1600 [Urine:1600]   Intake/Output this shift:  No intake/output data recorded.  Physical Exam: General: A/Ox3, in no acute distress, afebrile and nondiaphoretic CVS- RRR G3 Systolic murmur RS- CTA bilaterally ABD- BS present soft non-distended, nontender EXT- no edema, nontender   Basic Metabolic Panel: Recent Labs  Lab 08/13/17 0404 08/14/17 0545 08/15/17 0424 08/16/17 0344 08/17/17 0650  NA 131* 136 137 141 139  K 5.6* 3.8 4.0 3.2* 4.3  CL 101 96* 100 103 103  CO2 18* 25 22 27 23   GLUCOSE 88 84 96 88 112*  BUN 48* 52* 53* 49* 44*  CREATININE 4.36* 4.52* 3.74* 3.52* 3.41*  CALCIUM 8.2* 8.2* 8.2* 8.3* 8.5*  PHOS  --   --   --  4.7*  --     Liver Function Tests: Recent Labs  Lab 08/16/17 0344  ALBUMIN 3.1*   No results for input(s): LIPASE, AMYLASE in the last 168 hours. No results for input(s): AMMONIA in the last 168 hours.  CBC: Recent Labs  Lab 08/11/17 1023 08/12/17 0527 08/13/17 0628 08/14/17 0545 08/15/17 0424  WBC 5.2 4.7 3.6* 3.7* 3.6*  HGB  7.7* 7.3* 7.3* 8.1* 8.4*  HCT 24.5* 24.0* 22.7* 25.6* 25.8*  MCV 98.0 98.8 93.4 94.8 92.5  PLT 242 257 270 292 273    Cardiac Enzymes: Recent Labs  Lab 08/14/17 0545  TROPONINI <0.03    BNP: Invalid input(s): POCBNP  CBG: No results for input(s): GLUCAP in the last 168 hours.  Microbiology: Results for orders placed or performed during the hospital encounter of 08/09/17  Blood Culture (routine x 2)     Status: None   Collection Time: 08/09/17  4:12 PM  Result Value Ref Range Status   Specimen Description BLOOD RIGHT ANTECUBITAL  Final   Special Requests   Final    BOTTLES DRAWN AEROBIC AND ANAEROBIC Blood Culture results may not be optimal due to an inadequate volume of blood received in culture bottles   Culture   Final    NO GROWTH 5 DAYS Performed at [facility], [place] [address], [place], [place] [address]    Report Status 08/14/2017 FINAL  Final  Blood Culture (routine x 2)     Status: None   Collection Time: 08/09/17  4:18 PM  Result Value Ref Range Status   Specimen Description BLOOD RIGHT WRIST  Final   Special Requests   Final    BOTTLES DRAWN AEROBIC AND ANAEROBIC Blood Culture  adequate volume   Culture   Final    NO GROWTH 5 DAYS Performed at [facility], [place] [address], [place], [place] [address]    Report Status 08/14/2017 FINAL  Final    Coagulation Studies: No results for input(s): LABPROT, INR in the last 72 hours.  Urinalysis: Recent Labs    08/14/17 1312  COLORURINE YELLOW  LABSPEC 1.009  PHURINE 5.0  GLUCOSEU NEGATIVE  HGBUR NEGATIVE  BILIRUBINUR NEGATIVE  KETONESUR NEGATIVE  PROTEINUR NEGATIVE  NITRITE NEGATIVE  LEUKOCYTESUR NEGATIVE      Imaging: No results found.   Medications:    . acetaminophen  650 mg Oral Q6H  . aspirin EC  81 mg Oral Daily  . azaTHIOprine  100 mg Oral Daily  . clotrimazole  10 mg Oral 5 X Daily  . fluticasone  1 spray Each Nare Daily  . heparin  5,000 Units Subcutaneous Q8H  .  latanoprost  1 drop Left Eye QHS  . metoCLOPramide  5 mg Oral BID  . nicotine  14 mg Transdermal Daily  . pantoprazole  40 mg Oral Daily  . predniSONE  40 mg Oral Q breakfast  . tacrolimus  2 mg Oral QPM  . tacrolimus  3 mg Oral Q0600   albuterol, diphenhydrAMINE, guaiFENesin-dextromethorphan, ondansetron, [DISCONTINUED] oxyCODONE-acetaminophen **AND** oxyCODONE  Assessment/ Plan:  Tina Watkins is a 52 yo F w/ a PMHx notable for ESRD s/p renal transplant x 3 w/ CKD III, gastroparesis, chronic pain syndrome, gout, MDD, MAD, tobacco use disorder, who presented with cough and pelvic pain. She was treated for CAP with O2 via [place] and vancomycin. She developed an acute renal injury on CKD most likely from chronic hypotension and vancomycin toxicity and felt unlikely to be ATN or acute rejection. UA w/ microscopy w/ minimal WBC and proteinuria not consistent with rejection.    Acute renal injury-Felt to be multifactorial ATN on CKD 2/2 hypotension 2/2 pneumonia and vancomycin toxicity. Monitoring with daily labs and BP for improvement. Avoid nephrotoxic agents. Continue tacrolimus, azathioprine and prednisone daily for her third renal transplant.  Tacrolimus level pending  ANEMIA-Hgb stable at 8.4 as of 07/14. Given dose of aranesp 62mcg on 7/13. Continue to monitor. Transfusion goal >7.0.   HTN/VOL-Patient w/ borderline hypotension (81-107)/(50-64). Volume up 2.5L's as per chart review. Patient states that she is chronically hypotensive near a systolic of 90 at home as well and remains asymptomatic here.   GOUT-Agree with Prednisone 40mg  daily  Likely cleared today with close outpatient follow-up. As she is planning on a trip to [place] this Friday I would recommend a BMP on 08/23/2017 to continue to monitor her renal function and again at least a week after.    LOS: [place], MD Internal Medicine Resident, PGY-2

## 2017-08-17 NOTE — Progress Notes (Signed)
Physical Therapy Treatment and Discharge Patient Details Name: Tina Watkins MRN: [id number] DOB: 11-15-1965 Today's Date: 08/17/2017    History of Present Illness Pt is a 52 y.o. F wih significant PMH of kidney transplant, gastroparesis, chronic pain syndrome, gout, depression, anxiety, tobacco use. Presenting with fevers, cough, and abdominal pain. CT chest significant for multilobar pneumonia and interstitial edema primarily in the right lobe.     PT Comments    Continuing work on functional mobility and activity tolerance;  Much improved gait and activity tolerance from previous sessions; stair training done; Acute PT goals met; Will sign off.  Follow Up Recommendations  Home health PT(HHRN for chronic disease management)     Equipment Recommendations  None recommended by PT    Recommendations for Other Services       Precautions / Restrictions Restrictions Weight Bearing Restrictions: No    Mobility  Bed Mobility Overal bed mobility: Independent                Transfers Overall transfer level: Independent                  Ambulation/Gait Ambulation/Gait assistance: Modified independent (Device/Increase time) Gait Distance (Feet): 200 Feet Assistive device: None Gait Pattern/deviations: Step-through pattern;Decreased stride length;Decreased dorsiflexion - right;Decreased dorsiflexion - left Gait velocity: decreased   General Gait Details: One standing rest break; Overall close to symmetrical gait pattern, especailly at the beginning and first half of walk; with some fatigue after stairs and heading back to her room, and occasionally she reached out for hallway rail   Stairs Stairs: Yes Stairs assistance: Supervision Stair Management: One rail Right;Step to pattern;Forwards Number of Stairs: 12 General stair comments: Cues for sequence   Wheelchair Mobility    Modified Rankin (Stroke Patients Only)       Balance Overall balance  assessment: Mild deficits observed, not formally tested                                          Cognition Arousal/Alertness: Awake/alert Behavior During Therapy: WFL for tasks assessed/performed Overall Cognitive Status: Within Functional Limits for tasks assessed                                        Exercises      General Comments        Pertinent Vitals/Pain Pain Assessment: 0-10 Pain Score: 8  Faces Pain Scale: Hurts a little bit Pain Location: Chronic upper back pain, L foot pain from gout Pain Descriptors / Indicators: Aching;Sore Pain Intervention(s): Monitored during session    Home Living                      Prior Function            PT Goals (current goals can now be found in the care plan section) Acute Rehab PT Goals Patient Stated Goal: to increase strength and endurance Progress towards PT goals: Goals met/education completed, patient discharged from PT    Frequency    Min 3X/week      PT Plan Current plan remains appropriate    Co-evaluation              AM-PAC PT "6 Clicks" Daily Activity  Outcome Measure  Difficulty turning over in  bed (including adjusting bedclothes, sheets and blankets)?: None Difficulty moving from lying on back to sitting on the side of the bed? : None Difficulty sitting down on and standing up from a chair with arms (e.g., wheelchair, bedside commode, etc,.)?: None Help needed moving to and from a bed to chair (including a wheelchair)?: None Help needed walking in hospital room?: A Little Help needed climbing 3-5 steps with a railing? : A Little 6 Click Score: 22    End of Session Equipment Utilized During Treatment: Gait belt Activity Tolerance: Patient tolerated treatment well Patient left: Other (comment)(managing independently in room) Nurse Communication: Mobility status PT Visit Diagnosis: Unsteadiness on feet (R26.81);Muscle weakness (generalized)  (M62.81);Difficulty in walking, not elsewhere classified (R26.2);Pain Pain - part of body: (back)     Time: 4497-5300 PT Time Calculation (min) (ACUTE ONLY): 21 min  Charges:  $Gait Training: 8-22 mins                    G Codes:       Roney Marion, PT  Acute Rehabilitation Services Pager [phone] Office [place] 08/17/2017, 3:18 PM

## 2017-08-17 NOTE — Discharge Instructions (Signed)
°  Please complete the following steroid taper:  Take 40 mg for 3 days (2 tabs) Take 30 mg for 3 days (1.5 tabs) Take 20 mg for 3 days (1 tab) Take 10 mg for 3 days (0.5 tabs) Then resume your 5 mg daily dose of prednisone   Please follow up within a week to get your kidney function levels checked again.  Ultimately, you were hospitalized and treated for pneumonia. The antibiotics you got for pneumonia affected your kidneys.

## 2017-08-17 NOTE — Progress Notes (Signed)
Patient discharged to home. Patient AVS reviewed and signed. Patient capable re-verbalizing medications and follow-up appointments. IV removed. Patient belongings sent with patient. Patient educated to return to the ED in the event of SOB, chest pain or dizziness.   Hanah Moultry B. RN 

## 2017-08-18 DIAGNOSIS — Z940 Kidney transplant status: Secondary | ICD-10-CM | POA: Diagnosis not present

## 2017-08-19 LAB — TACROLIMUS LEVEL: Tacrolimus (FK506) - LabCorp: 10 ng/mL (ref 2.0–20.0)

## 2017-08-20 ENCOUNTER — Encounter (HOSPITAL_COMMUNITY): Payer: Medicaid Other

## 2017-08-20 DIAGNOSIS — Z940 Kidney transplant status: Secondary | ICD-10-CM | POA: Diagnosis not present

## 2017-09-03 IMAGING — DX DG ABDOMEN ACUTE W/ 1V CHEST
3 series · 3 of 3 positions shown · non-contrast
Comparison: Chest and abdominal radiographs of 01/25/2014

CLINICAL DATA: Abdominal pain, nausea and vomiting today,
convulsions, history stroke, end-stage renal disease, fibromyalgia,
smoking

EXAM:
DG ABDOMEN ACUTE W/ 1V CHEST

[abdomen erect]
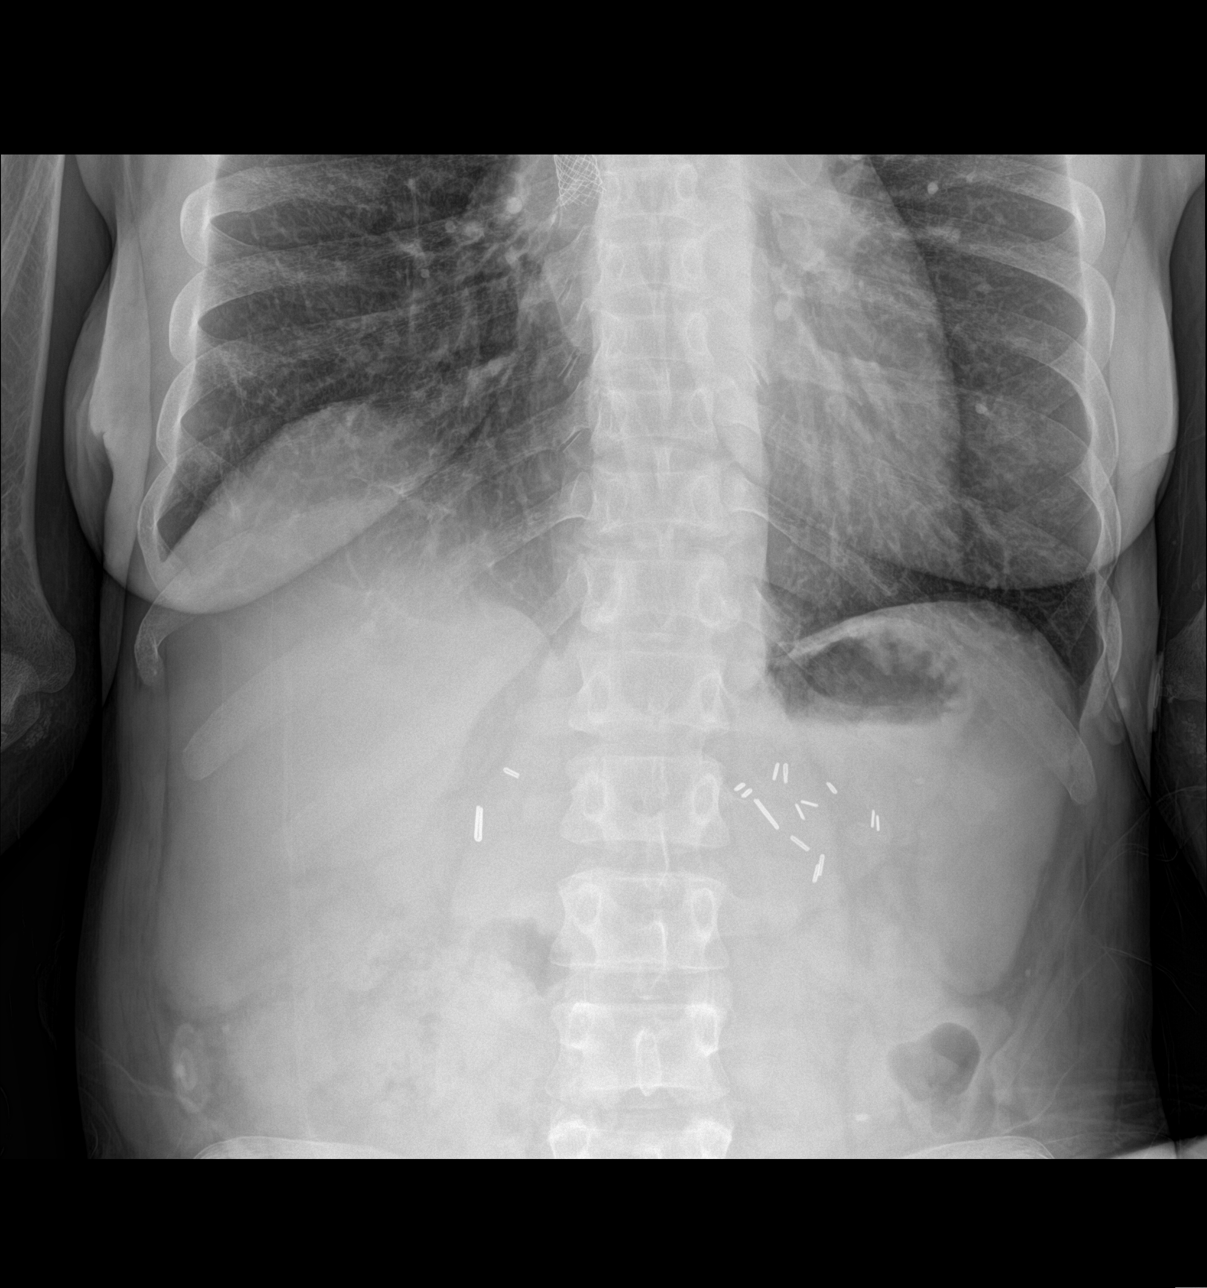

[abdomen supine]
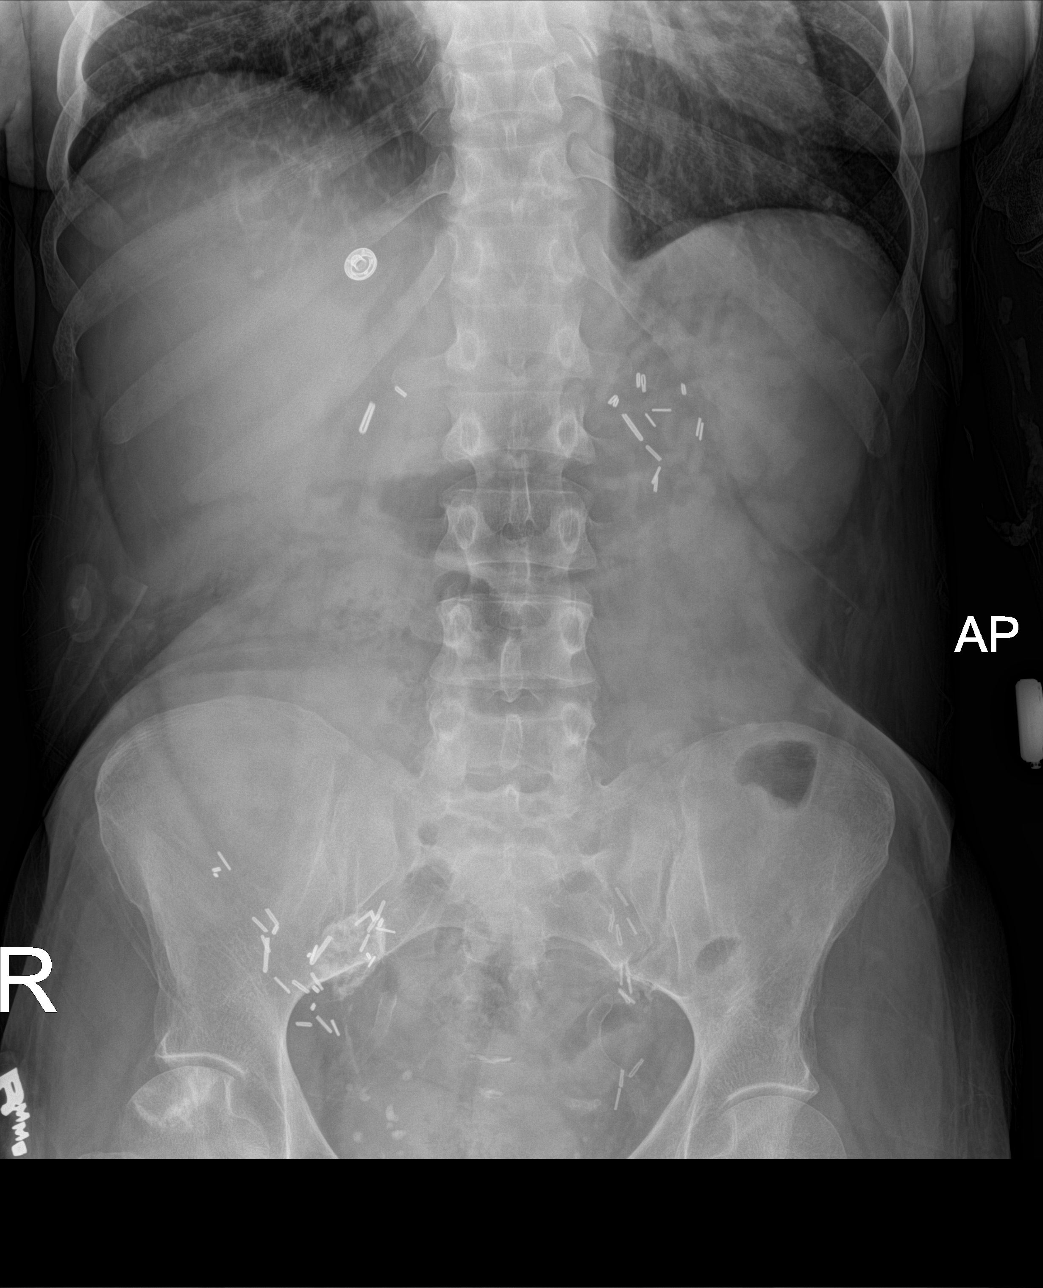

[chest ap]
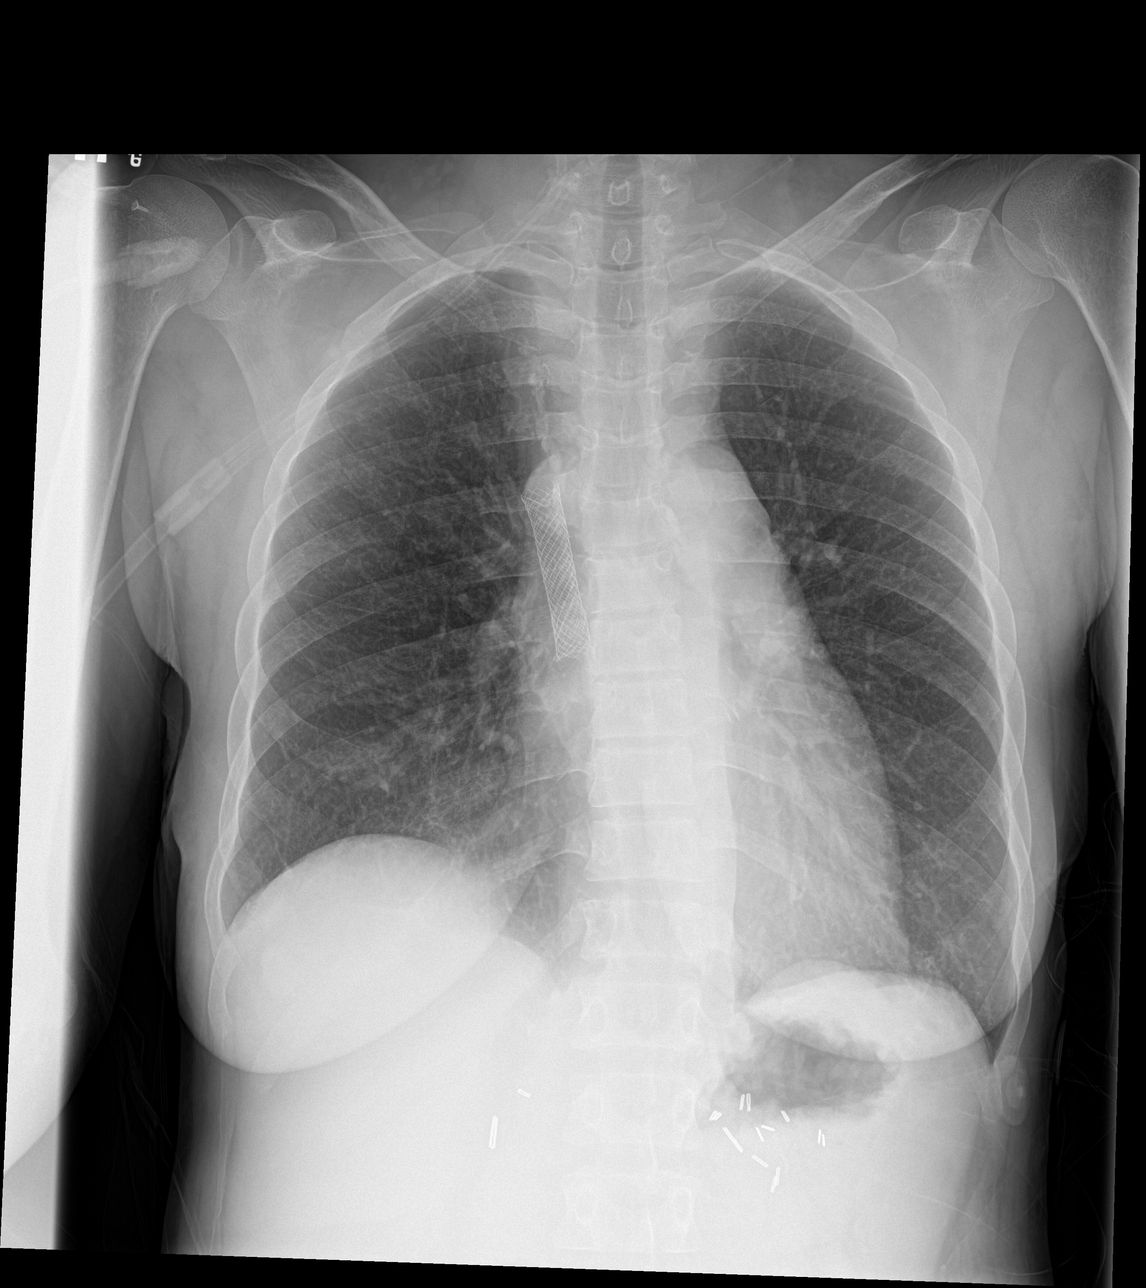

[3 of 3 positions shown; findings below may reference images not displayed]

FINDINGS: SVC stent again identified.

Upper normal heart size.

Mediastinal contours and pulmonary vascularity normal.

Lungs appear hyperinflated but clear.

No infiltrate, pleural effusion or pneumothorax.

Surgical clips in both upper quadrants and in pelvis bilaterally.

Nonobstructive bowel gas pattern.

No bowel dilatation, bowel wall thickening, or free intraperitoneal
air.

Bones demineralized.

Scattered vascular calcifications and phleboliths in pelvis.

No definite urinary tract calcification.

Bones demineralized with evidence of interval cervical spine fusion.
IMPRESSION: No acute abnormalities.

## 2017-09-10 ENCOUNTER — Encounter (HOSPITAL_COMMUNITY): Payer: Medicaid Other

## 2017-09-23 DIAGNOSIS — Z79899 Other long term (current) drug therapy: Secondary | ICD-10-CM | POA: Diagnosis not present

## 2017-09-23 DIAGNOSIS — I959 Hypotension, unspecified: Secondary | ICD-10-CM | POA: Diagnosis not present

## 2017-09-23 DIAGNOSIS — E559 Vitamin D deficiency, unspecified: Secondary | ICD-10-CM | POA: Diagnosis not present

## 2017-09-23 DIAGNOSIS — E7800 Pure hypercholesterolemia, unspecified: Secondary | ICD-10-CM | POA: Diagnosis not present

## 2017-09-23 DIAGNOSIS — T8611 Kidney transplant rejection: Secondary | ICD-10-CM | POA: Diagnosis not present

## 2017-09-23 DIAGNOSIS — Z940 Kidney transplant status: Secondary | ICD-10-CM | POA: Diagnosis not present

## 2017-09-23 DIAGNOSIS — M4722 Other spondylosis with radiculopathy, cervical region: Secondary | ICD-10-CM | POA: Diagnosis not present

## 2017-09-23 DIAGNOSIS — J189 Pneumonia, unspecified organism: Secondary | ICD-10-CM | POA: Diagnosis not present

## 2017-09-23 DIAGNOSIS — D631 Anemia in chronic kidney disease: Secondary | ICD-10-CM | POA: Diagnosis not present

## 2017-09-23 DIAGNOSIS — K3184 Gastroparesis: Secondary | ICD-10-CM | POA: Diagnosis not present

## 2017-09-23 DIAGNOSIS — N189 Chronic kidney disease, unspecified: Secondary | ICD-10-CM | POA: Diagnosis not present

## 2017-09-23 DIAGNOSIS — M109 Gout, unspecified: Secondary | ICD-10-CM | POA: Diagnosis not present

## 2017-09-23 DIAGNOSIS — N179 Acute kidney failure, unspecified: Secondary | ICD-10-CM | POA: Diagnosis not present

## 2017-09-23 DIAGNOSIS — H401122 Primary open-angle glaucoma, left eye, moderate stage: Secondary | ICD-10-CM | POA: Diagnosis not present

## 2017-09-23 DIAGNOSIS — L908 Other atrophic disorders of skin: Secondary | ICD-10-CM | POA: Diagnosis not present

## 2017-09-24 ENCOUNTER — Ambulatory Visit (HOSPITAL_COMMUNITY)
Admission: RE | Admit: 2017-09-24 | Discharge: 2017-09-24 | Disposition: A | Discharge status: Home / Self Care | Payer: Medicaid Other | Source: Ambulatory Visit | Attending: Nephrology | Admitting: Nephrology

## 2017-09-24 VITALS — BP 100/61 | HR 67 | Resp 18

## 2017-09-24 DIAGNOSIS — Z940 Kidney transplant status: Secondary | ICD-10-CM | POA: Diagnosis not present

## 2017-09-24 DIAGNOSIS — N185 Chronic kidney disease, stage 5: Secondary | ICD-10-CM | POA: Insufficient documentation

## 2017-09-24 DIAGNOSIS — D631 Anemia in chronic kidney disease: Secondary | ICD-10-CM | POA: Diagnosis not present

## 2017-09-24 LAB — POCT HEMOGLOBIN-HEMACUE: Hemoglobin: 10.1 g/dL — ABNORMAL LOW (ref 12.0–15.0)

## 2017-09-24 MED ORDER — EPOETIN ALFA 40000 UNIT/ML IJ SOLN
INTRAMUSCULAR | Status: AC
  Filled 2017-09-24: qty 1

## 2017-09-24 MED ORDER — EPOETIN ALFA 20000 UNIT/ML IJ SOLN
40000.0000 [IU] | INTRAMUSCULAR | Status: DC
  Administered 2017-09-24: 40000 [IU] via SUBCUTANEOUS

## 2017-09-25 LAB — PTH, INTACT AND CALCIUM
Calcium, Total (PTH): 9.3 mg/dL (ref 8.7–10.2)
PTH: 217 pg/mL — ABNORMAL HIGH (ref 15–65)

## 2017-09-27 MED FILL — Epoetin Alfa-epbx Inj 40000 Unit/ML: INTRAMUSCULAR | Qty: 1 | Status: AC

## 2017-10-03 ENCOUNTER — Other Ambulatory Visit: Payer: Self-pay | Admitting: Family Medicine

## 2017-10-08 ENCOUNTER — Encounter (HOSPITAL_COMMUNITY)
Admission: RE | Admit: 2017-10-08 | Discharge: 2017-10-08 | Disposition: A | Discharge status: Home / Self Care | Payer: Medicaid Other | Source: Ambulatory Visit | Attending: Nephrology | Admitting: Nephrology

## 2017-10-08 VITALS — BP 118/68 | HR 72 | Temp 98.7°F | Resp 20

## 2017-10-08 DIAGNOSIS — N185 Chronic kidney disease, stage 5: Secondary | ICD-10-CM

## 2017-10-08 DIAGNOSIS — Z940 Kidney transplant status: Secondary | ICD-10-CM | POA: Insufficient documentation

## 2017-10-08 DIAGNOSIS — D631 Anemia in chronic kidney disease: Secondary | ICD-10-CM

## 2017-10-08 LAB — IRON AND TIBC
Iron: 107 ug/dL (ref 28–170)
Saturation Ratios: 53 % — ABNORMAL HIGH (ref 10.4–31.8)
TIBC: 200 ug/dL — ABNORMAL LOW (ref 250–450)
UIBC: 93 ug/dL

## 2017-10-08 LAB — COMPREHENSIVE METABOLIC PANEL
ALT: 18 U/L (ref 0–44)
AST: 21 U/L (ref 15–41)
Albumin: 3.9 g/dL (ref 3.5–5.0)
Alkaline Phosphatase: 118 U/L (ref 38–126)
Anion gap: 6 (ref 5–15)
BUN: 23 mg/dL — ABNORMAL HIGH (ref 6–20)
CO2: 22 mmol/L (ref 22–32)
Calcium: 9.4 mg/dL (ref 8.9–10.3)
Chloride: 114 mmol/L — ABNORMAL HIGH (ref 98–111)
Creatinine, Ser: 1.94 mg/dL — ABNORMAL HIGH (ref 0.44–1.00)
GFR calc Af Amer: 34 mL/min — ABNORMAL LOW (ref >60)
GFR calc non Af Amer: 29 mL/min — ABNORMAL LOW (ref >60)
Glucose, Bld: 100 mg/dL — ABNORMAL HIGH (ref 70–99)
Potassium: 4.7 mmol/L (ref 3.5–5.1)
Sodium: 142 mmol/L (ref 135–145)
Total Bilirubin: 0.7 mg/dL (ref 0.3–1.2)
Total Protein: 6.5 g/dL (ref 6.5–8.1)

## 2017-10-08 LAB — FERRITIN: Ferritin: 1122 ng/mL — ABNORMAL HIGH (ref 11–307)

## 2017-10-08 LAB — PHOSPHORUS: Phosphorus: 4.2 mg/dL (ref 2.5–4.6)

## 2017-10-08 LAB — POCT HEMOGLOBIN-HEMACUE: Hemoglobin: 10.6 g/dL — ABNORMAL LOW (ref 12.0–15.0)

## 2017-10-08 MED ORDER — EPOETIN ALFA 20000 UNIT/ML IJ SOLN: 40000.0000 [IU] | INTRAMUSCULAR | Status: DC

## 2017-10-08 MED ORDER — EPOETIN ALFA 40000 UNIT/ML IJ SOLN
INTRAMUSCULAR | Status: AC
  Administered 2017-10-08: 40000 [IU] via SUBCUTANEOUS
  Filled 2017-10-08: qty 1

## 2017-10-22 ENCOUNTER — Ambulatory Visit (HOSPITAL_COMMUNITY)
Admission: RE | Admit: 2017-10-22 | Discharge: 2017-10-22 | Disposition: A | Discharge status: Home / Self Care | Payer: Medicaid Other | Source: Ambulatory Visit | Attending: Nephrology | Admitting: Nephrology

## 2017-10-22 VITALS — BP 111/65 | HR 66 | Temp 97.7°F | Resp 20

## 2017-10-22 DIAGNOSIS — Z940 Kidney transplant status: Secondary | ICD-10-CM | POA: Diagnosis not present

## 2017-10-22 DIAGNOSIS — N185 Chronic kidney disease, stage 5: Secondary | ICD-10-CM | POA: Diagnosis not present

## 2017-10-22 DIAGNOSIS — D631 Anemia in chronic kidney disease: Secondary | ICD-10-CM

## 2017-10-22 LAB — POCT HEMOGLOBIN-HEMACUE: Hemoglobin: 11.3 g/dL — ABNORMAL LOW (ref 12.0–15.0)

## 2017-10-22 MED ORDER — EPOETIN ALFA 20000 UNIT/ML IJ SOLN: 40000.0000 [IU] | INTRAMUSCULAR | Status: DC

## 2017-10-22 MED ORDER — EPOETIN ALFA 40000 UNIT/ML IJ SOLN
INTRAMUSCULAR | Status: AC
  Administered 2017-10-22: 40000 [IU] via SUBCUTANEOUS
  Filled 2017-10-22: qty 1

## 2017-10-24 IMAGING — US US CHEST/MEDIASTINUM
1 series · 3 of 3 positions shown · non-contrast
Comparison: None.

CLINICAL DATA: Patient with left-sided chest pain with a recent CT
scan suggesting a loculated left-sided pleural effusion. Request is
made for diagnostic and therapeutic thoracentesis if able.

EXAM:
CHEST ULTRASOUND

[Series 1: us chest/mediastinum · 0.26mm/px · 3 of 3 slices shown]
[im 1/3]
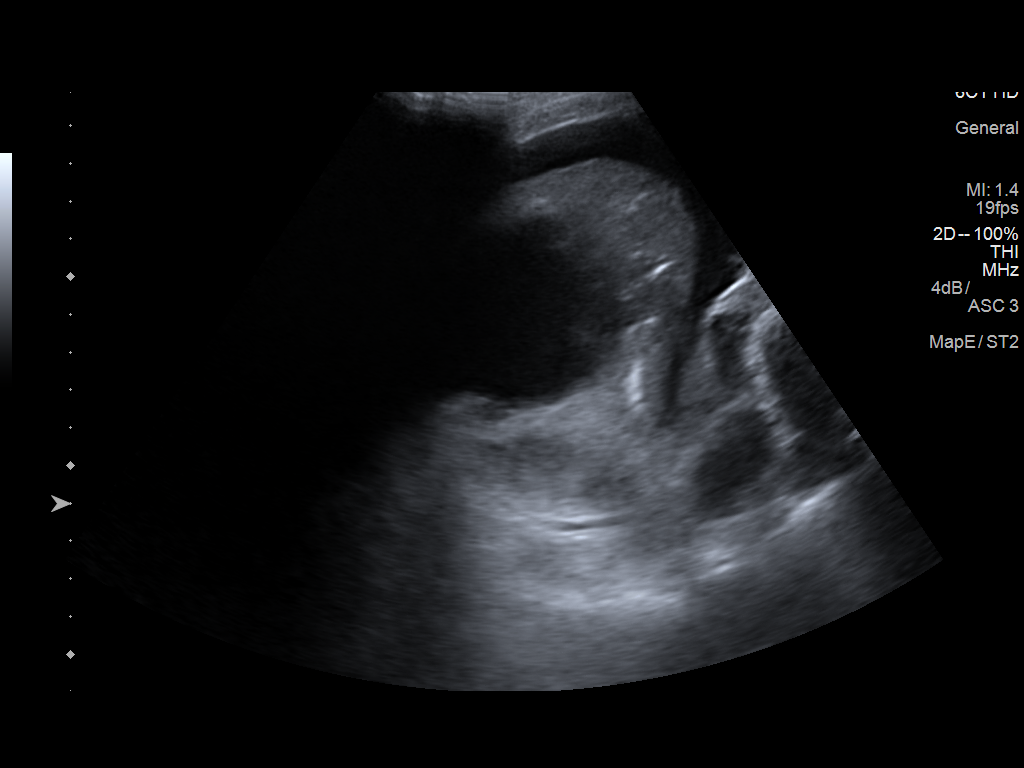
[im 2/3]
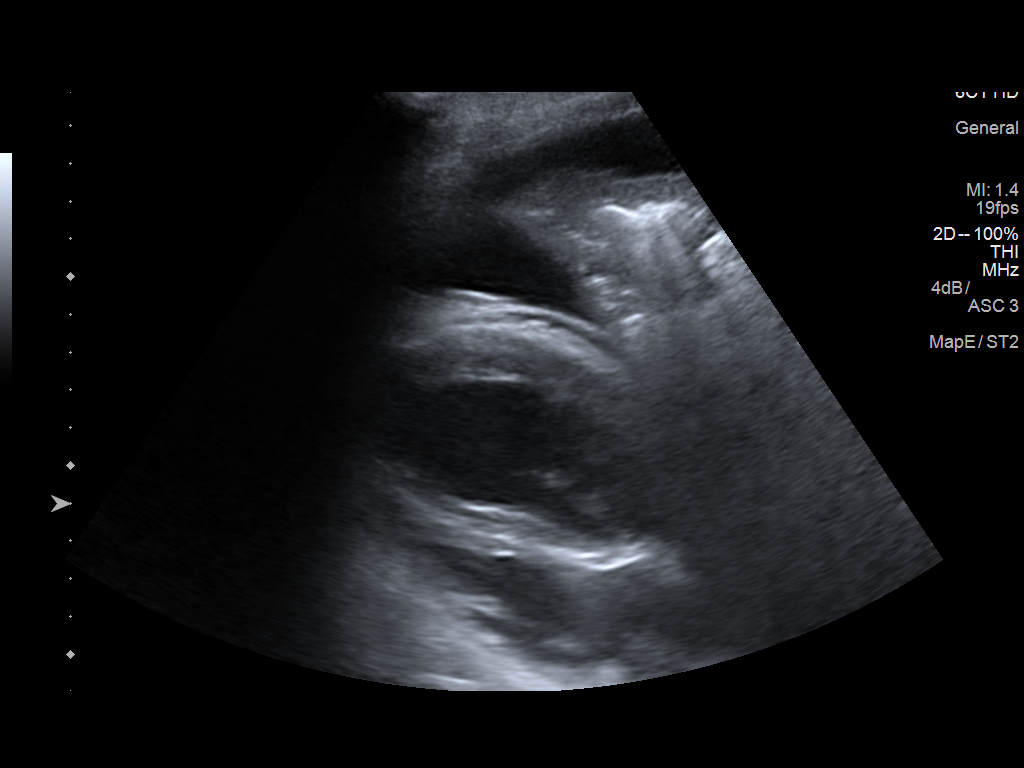
[im 3/3]
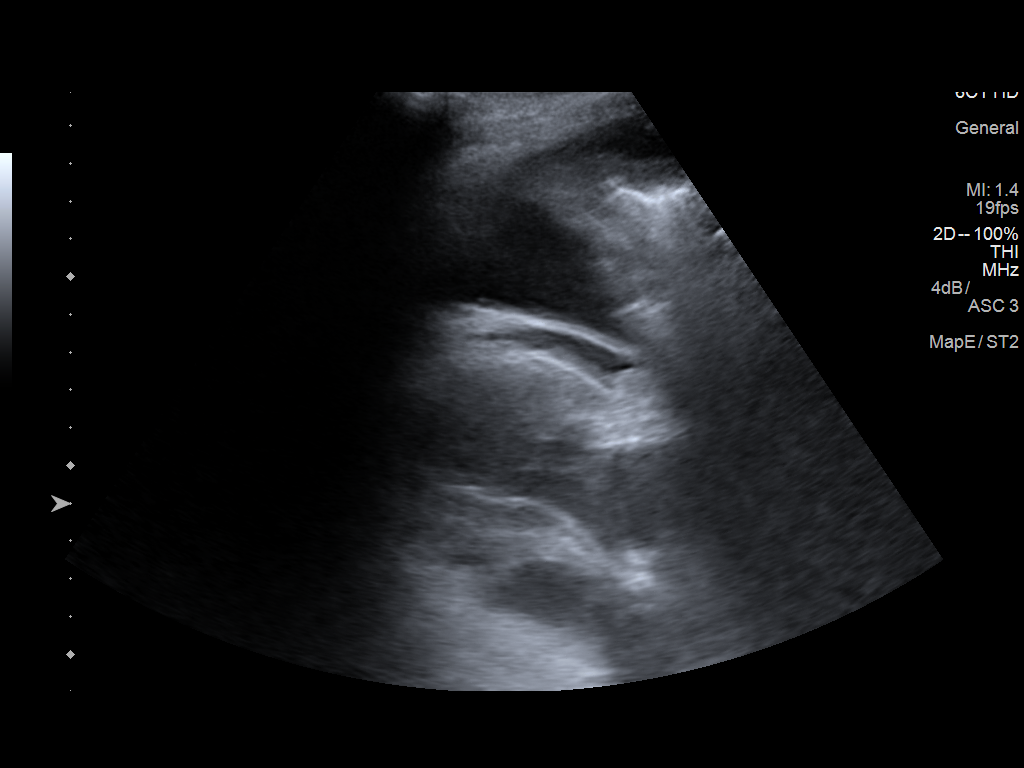

[3 of 3 positions shown; findings below may reference images not displayed]

FINDINGS: Trace amount of simple anechoic fluid noted in the left chest. No
evidence of loculations.
IMPRESSION: Trace amount of simple appearing left-sided pleural fluid, not
enough to allow for safe ultrasound-guided paracentesis.

## 2017-10-25 DIAGNOSIS — Z79899 Other long term (current) drug therapy: Secondary | ICD-10-CM | POA: Diagnosis not present

## 2017-10-25 DIAGNOSIS — M109 Gout, unspecified: Secondary | ICD-10-CM | POA: Diagnosis not present

## 2017-10-25 DIAGNOSIS — M546 Pain in thoracic spine: Secondary | ICD-10-CM | POA: Diagnosis not present

## 2017-10-25 DIAGNOSIS — E7800 Pure hypercholesterolemia, unspecified: Secondary | ICD-10-CM | POA: Diagnosis not present

## 2017-10-25 DIAGNOSIS — J209 Acute bronchitis, unspecified: Secondary | ICD-10-CM | POA: Diagnosis not present

## 2017-10-25 DIAGNOSIS — M542 Cervicalgia: Secondary | ICD-10-CM | POA: Diagnosis not present

## 2017-11-05 ENCOUNTER — Encounter (HOSPITAL_COMMUNITY)
Admission: RE | Admit: 2017-11-05 | Discharge: 2017-11-05 | Disposition: A | Discharge status: Home / Self Care | Payer: Medicaid Other | Source: Ambulatory Visit | Attending: Nephrology | Admitting: Nephrology

## 2017-11-05 VITALS — BP 94/60 | HR 67 | Temp 98.2°F | Resp 20

## 2017-11-05 DIAGNOSIS — Z940 Kidney transplant status: Secondary | ICD-10-CM | POA: Insufficient documentation

## 2017-11-05 DIAGNOSIS — N185 Chronic kidney disease, stage 5: Secondary | ICD-10-CM | POA: Diagnosis not present

## 2017-11-05 DIAGNOSIS — D631 Anemia in chronic kidney disease: Secondary | ICD-10-CM

## 2017-11-05 LAB — POCT HEMOGLOBIN-HEMACUE: Hemoglobin: 11.6 g/dL — ABNORMAL LOW (ref 12.0–15.0)

## 2017-11-05 MED ORDER — EPOETIN ALFA 20000 UNIT/ML IJ SOLN: 40000.0000 [IU] | INTRAMUSCULAR | Status: DC

## 2017-11-05 MED ORDER — EPOETIN ALFA 40000 UNIT/ML IJ SOLN
INTRAMUSCULAR | Status: AC
  Administered 2017-11-05: 40000 [IU]
  Filled 2017-11-05: qty 1

## 2017-11-19 ENCOUNTER — Observation Stay (HOSPITAL_COMMUNITY)
Admission: EM | Admit: 2017-11-19 | Discharge: 2017-11-20 | Disposition: A | Discharge status: Home / Self Care | Payer: Medicaid Other | Attending: Internal Medicine | Admitting: Internal Medicine

## 2017-11-19 ENCOUNTER — Ambulatory Visit (HOSPITAL_COMMUNITY)
Admission: RE | Admit: 2017-11-19 | Discharge: 2017-11-19 | Disposition: A | Discharge status: Home / Self Care | Payer: Medicaid Other | Source: Ambulatory Visit | Attending: Nephrology | Admitting: Nephrology

## 2017-11-19 ENCOUNTER — Other Ambulatory Visit: Payer: Self-pay

## 2017-11-19 ENCOUNTER — Encounter (HOSPITAL_COMMUNITY): Payer: Self-pay | Admitting: Emergency Medicine

## 2017-11-19 VITALS — BP 115/60 | HR 65 | Temp 98.2°F | Resp 20

## 2017-11-19 DIAGNOSIS — M79604 Pain in right leg: Secondary | ICD-10-CM | POA: Diagnosis present

## 2017-11-19 DIAGNOSIS — Z720 Tobacco use: Secondary | ICD-10-CM | POA: Diagnosis present

## 2017-11-19 DIAGNOSIS — M797 Fibromyalgia: Secondary | ICD-10-CM | POA: Diagnosis not present

## 2017-11-19 DIAGNOSIS — I4891 Unspecified atrial fibrillation: Secondary | ICD-10-CM | POA: Diagnosis not present

## 2017-11-19 DIAGNOSIS — Z940 Kidney transplant status: Secondary | ICD-10-CM

## 2017-11-19 DIAGNOSIS — N183 Chronic kidney disease, stage 3 unspecified: Secondary | ICD-10-CM | POA: Diagnosis present

## 2017-11-19 DIAGNOSIS — Z8673 Personal history of transient ischemic attack (TIA), and cerebral infarction without residual deficits: Secondary | ICD-10-CM | POA: Insufficient documentation

## 2017-11-19 DIAGNOSIS — F329 Major depressive disorder, single episode, unspecified: Secondary | ICD-10-CM | POA: Insufficient documentation

## 2017-11-19 DIAGNOSIS — I82411 Acute embolism and thrombosis of right femoral vein: Principal | ICD-10-CM | POA: Insufficient documentation

## 2017-11-19 DIAGNOSIS — H5461 Unqualified visual loss, right eye, normal vision left eye: Secondary | ICD-10-CM | POA: Insufficient documentation

## 2017-11-19 DIAGNOSIS — D631 Anemia in chronic kidney disease: Secondary | ICD-10-CM | POA: Diagnosis present

## 2017-11-19 DIAGNOSIS — M545 Low back pain: Secondary | ICD-10-CM | POA: Insufficient documentation

## 2017-11-19 DIAGNOSIS — R600 Localized edema: Secondary | ICD-10-CM | POA: Diagnosis present

## 2017-11-19 DIAGNOSIS — M7989 Other specified soft tissue disorders: Secondary | ICD-10-CM | POA: Diagnosis not present

## 2017-11-19 DIAGNOSIS — Z7901 Long term (current) use of anticoagulants: Secondary | ICD-10-CM | POA: Insufficient documentation

## 2017-11-19 DIAGNOSIS — G8929 Other chronic pain: Secondary | ICD-10-CM | POA: Diagnosis not present

## 2017-11-19 DIAGNOSIS — Z7982 Long term (current) use of aspirin: Secondary | ICD-10-CM | POA: Insufficient documentation

## 2017-11-19 DIAGNOSIS — M8580 Other specified disorders of bone density and structure, unspecified site: Secondary | ICD-10-CM | POA: Diagnosis not present

## 2017-11-19 DIAGNOSIS — I82811 Embolism and thrombosis of superficial veins of right lower extremities: Secondary | ICD-10-CM | POA: Insufficient documentation

## 2017-11-19 DIAGNOSIS — Z905 Acquired absence of kidney: Secondary | ICD-10-CM | POA: Insufficient documentation

## 2017-11-19 DIAGNOSIS — I639 Cerebral infarction, unspecified: Secondary | ICD-10-CM | POA: Diagnosis present

## 2017-11-19 DIAGNOSIS — F17210 Nicotine dependence, cigarettes, uncomplicated: Secondary | ICD-10-CM | POA: Diagnosis not present

## 2017-11-19 DIAGNOSIS — K3184 Gastroparesis: Secondary | ICD-10-CM | POA: Diagnosis not present

## 2017-11-19 DIAGNOSIS — M109 Gout, unspecified: Secondary | ICD-10-CM | POA: Insufficient documentation

## 2017-11-19 DIAGNOSIS — N189 Chronic kidney disease, unspecified: Secondary | ICD-10-CM

## 2017-11-19 DIAGNOSIS — Z86718 Personal history of other venous thrombosis and embolism: Secondary | ICD-10-CM | POA: Diagnosis present

## 2017-11-19 DIAGNOSIS — K219 Gastro-esophageal reflux disease without esophagitis: Secondary | ICD-10-CM | POA: Diagnosis not present

## 2017-11-19 DIAGNOSIS — D638 Anemia in other chronic diseases classified elsewhere: Secondary | ICD-10-CM | POA: Diagnosis present

## 2017-11-19 DIAGNOSIS — Z7951 Long term (current) use of inhaled steroids: Secondary | ICD-10-CM | POA: Diagnosis not present

## 2017-11-19 DIAGNOSIS — I824Y1 Acute embolism and thrombosis of unspecified deep veins of right proximal lower extremity: Secondary | ICD-10-CM | POA: Diagnosis present

## 2017-11-19 DIAGNOSIS — Z7952 Long term (current) use of systemic steroids: Secondary | ICD-10-CM | POA: Insufficient documentation

## 2017-11-19 DIAGNOSIS — N185 Chronic kidney disease, stage 5: Secondary | ICD-10-CM

## 2017-11-19 LAB — CBC WITH DIFFERENTIAL/PLATELET
Abs Immature Granulocytes: 0.03 10*3/uL (ref 0.00–0.07)
Basophils Absolute: 0 10*3/uL (ref 0.0–0.1)
Basophils Relative: 0 %
Eosinophils Absolute: 0.1 10*3/uL (ref 0.0–0.5)
Eosinophils Relative: 2 %
HCT: 36.6 % (ref 36.0–46.0)
Hemoglobin: 11.6 g/dL — ABNORMAL LOW (ref 12.0–15.0)
Immature Granulocytes: 1 %
Lymphocytes Relative: 9 %
Lymphs Abs: 0.4 10*3/uL — ABNORMAL LOW (ref 0.7–4.0)
MCH: 30.3 pg (ref 26.0–34.0)
MCHC: 31.7 g/dL (ref 30.0–36.0)
MCV: 95.6 fL (ref 80.0–100.0)
Monocytes Absolute: 0.3 10*3/uL (ref 0.1–1.0)
Monocytes Relative: 8 %
Neutro Abs: 3.4 10*3/uL (ref 1.7–7.7)
Neutrophils Relative %: 80 %
Platelets: 165 10*3/uL (ref 150–400)
RBC: 3.83 MIL/uL — ABNORMAL LOW (ref 3.87–5.11)
RDW: 20.5 % — ABNORMAL HIGH (ref 11.5–15.5)
WBC: 4.2 10*3/uL (ref 4.0–10.5)
nRBC: 0 % (ref 0.0–0.2)

## 2017-11-19 LAB — COMPREHENSIVE METABOLIC PANEL
ALT: 9 U/L (ref 0–44)
AST: 14 U/L — ABNORMAL LOW (ref 15–41)
Albumin: 4.1 g/dL (ref 3.5–5.0)
Alkaline Phosphatase: 112 U/L (ref 38–126)
Anion gap: 8 (ref 5–15)
BUN: 27 mg/dL — ABNORMAL HIGH (ref 6–20)
CO2: 24 mmol/L (ref 22–32)
Calcium: 9.4 mg/dL (ref 8.9–10.3)
Chloride: 109 mmol/L (ref 98–111)
Creatinine, Ser: 2.03 mg/dL — ABNORMAL HIGH (ref 0.44–1.00)
GFR calc Af Amer: 32 mL/min — ABNORMAL LOW (ref >60)
GFR calc non Af Amer: 27 mL/min — ABNORMAL LOW (ref >60)
Glucose, Bld: 99 mg/dL (ref 70–99)
Potassium: 4.9 mmol/L (ref 3.5–5.1)
Sodium: 141 mmol/L (ref 135–145)
Total Bilirubin: 0.6 mg/dL (ref 0.3–1.2)
Total Protein: 6.8 g/dL (ref 6.5–8.1)

## 2017-11-19 LAB — POCT HEMOGLOBIN-HEMACUE: Hemoglobin: 11.8 g/dL — ABNORMAL LOW (ref 12.0–15.0)

## 2017-11-19 IMAGING — MG DIGITAL SCREENING BILATERAL MAMMOGRAM WITH CAD
4 series · 4 of 4 positions shown · non-contrast
Comparison: Previous exam(s).

CLINICAL DATA: Screening.

EXAM:
DIGITAL SCREENING BILATERAL MAMMOGRAM WITH CAD

[R CC]
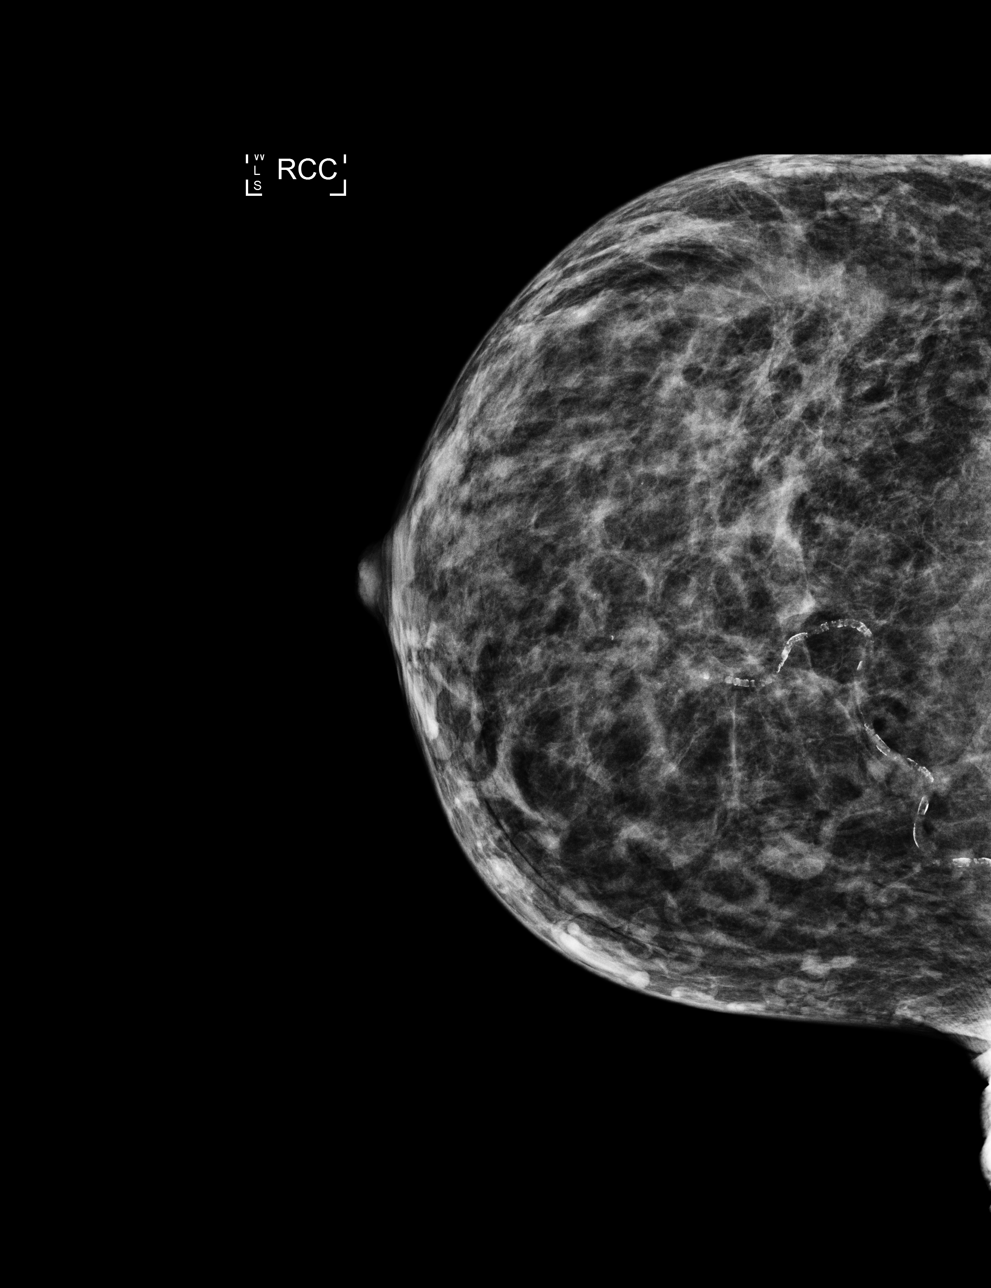

[R MLO]
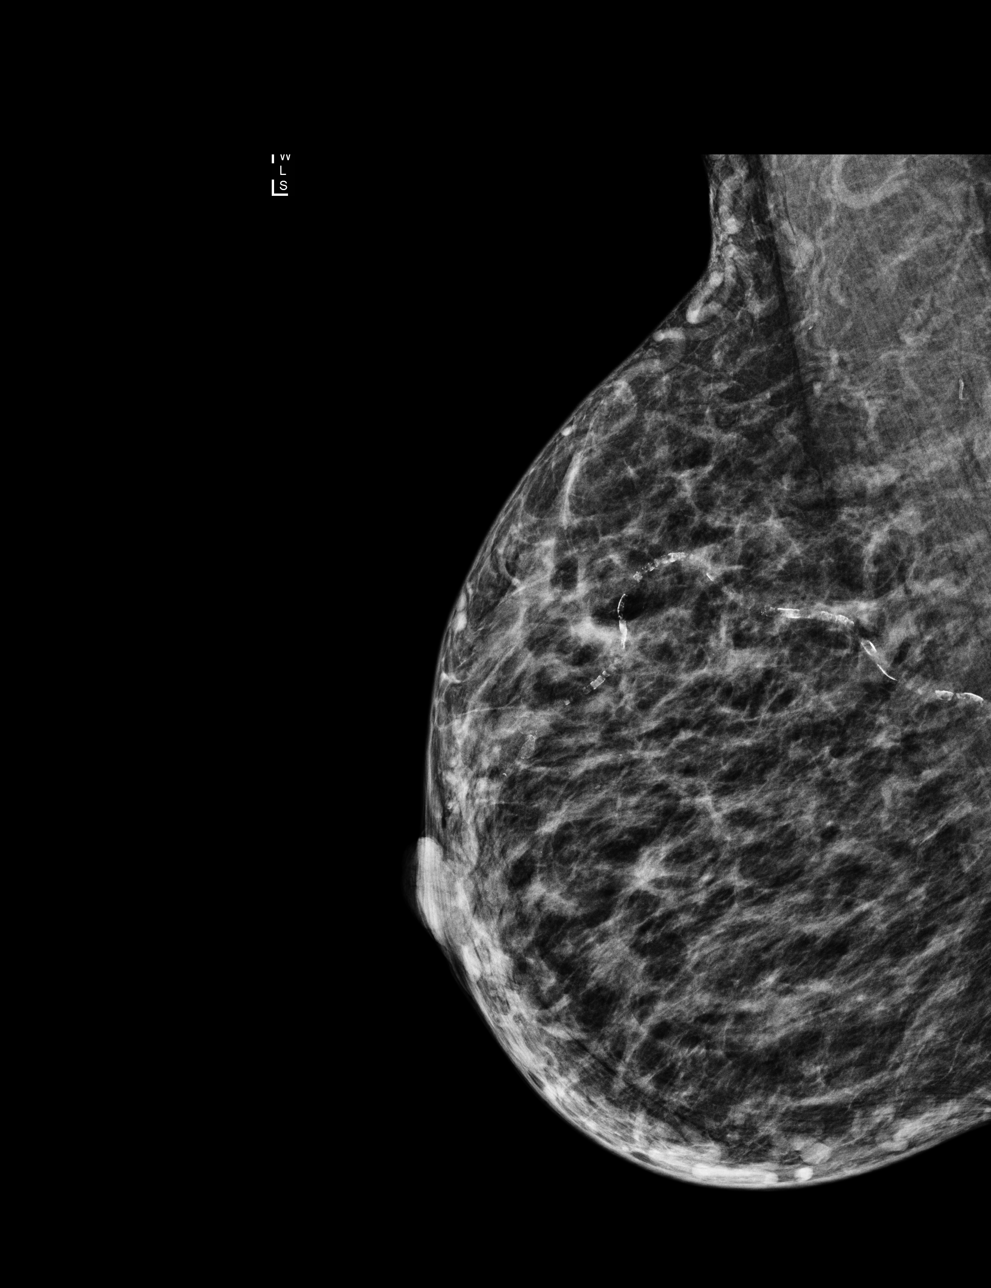

[L MLO]
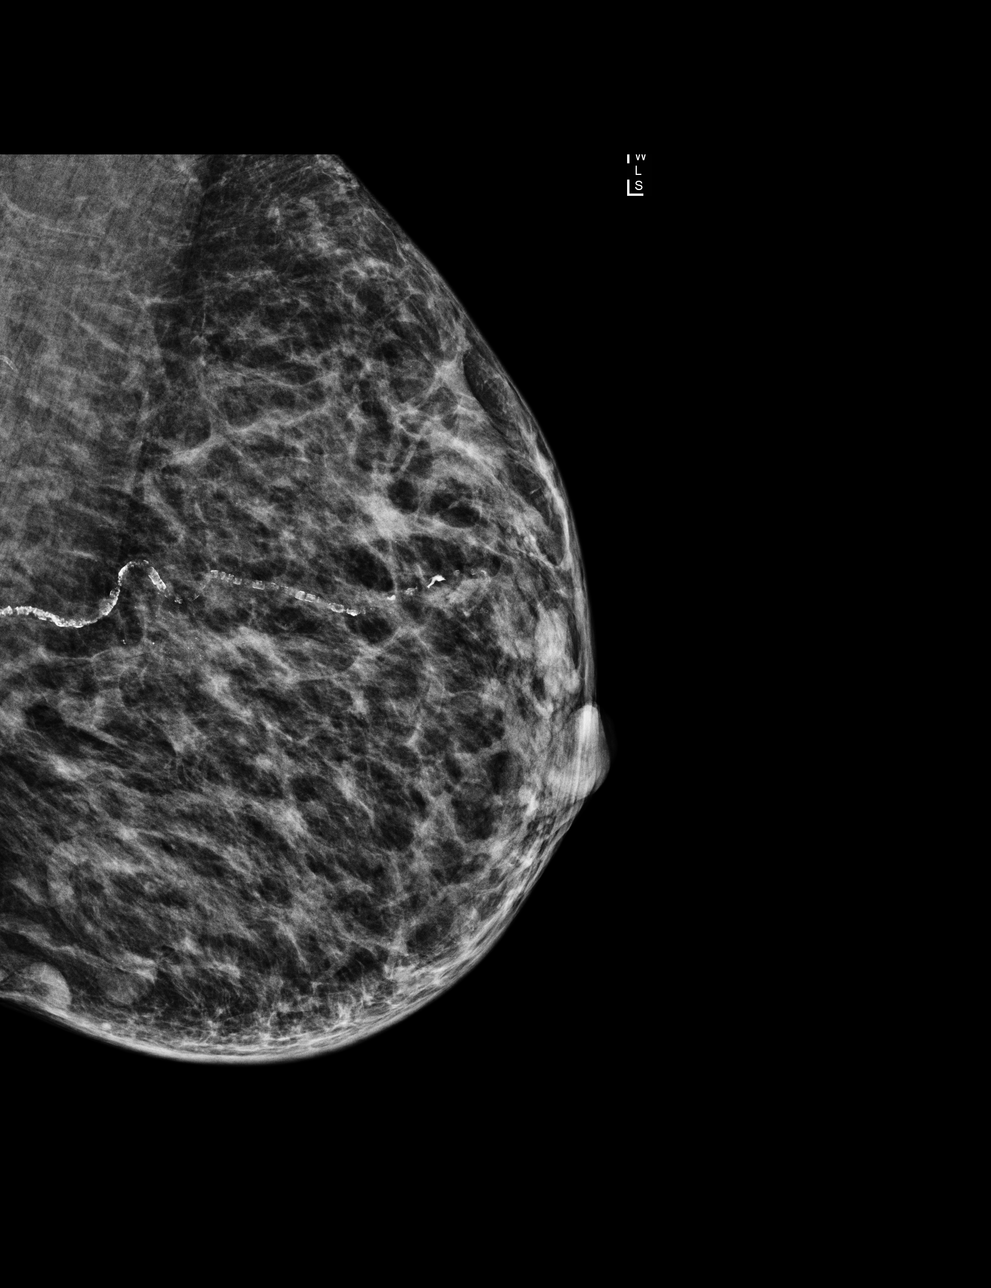

[L CC]
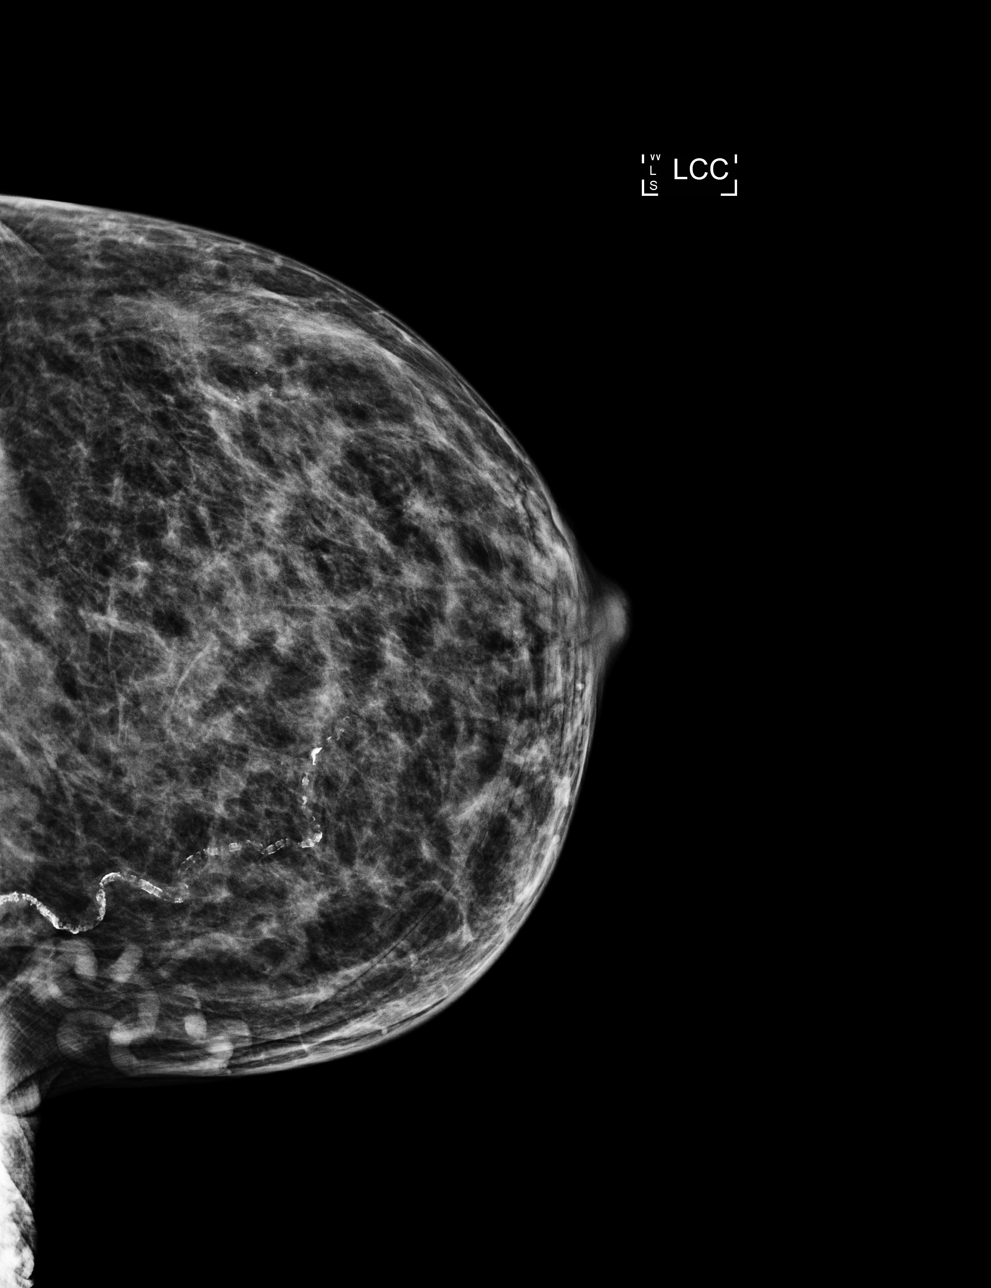

[4 of 4 positions shown; findings below may reference images not displayed]

ACR Breast Density Category b: There are scattered areas of
fibroglandular density.
FINDINGS: There are no findings suspicious for malignancy. Images were
processed with CAD.
IMPRESSION: No mammographic evidence of malignancy. A result letter of this
screening mammogram will be mailed directly to the patient.

RECOMMENDATION:
Screening mammogram in one year. (Code:AS-G-LCT)

BI-RADS CATEGORY  1: Negative.

## 2017-11-19 MED ORDER — EPOETIN ALFA 40000 UNIT/ML IJ SOLN
INTRAMUSCULAR | Status: AC
  Administered 2017-11-22: 09:00:00 via SUBCUTANEOUS
  Filled 2017-11-19: qty 1

## 2017-11-19 MED ORDER — EPOETIN ALFA 20000 UNIT/ML IJ SOLN: 40000.0000 [IU] | INTRAMUSCULAR | Status: DC

## 2017-11-19 NOTE — ED Notes (Signed)
Katlyn[place], RN notified of BP of [phone]

## 2017-11-19 NOTE — ED Triage Notes (Signed)
Pt reports 10/10 rt leg and groin pain started two days ago and leg swelling that started yesterday. +4 pitting edema, skin very tight, PMS intact. Pt took an extra Lasix without any relief. No injuries. Pt ambulatory with a limp. Pt is a kidney transplant pt from 2005.

## 2017-11-19 NOTE — ED Provider Notes (Signed)
Patient placed in Quick Look pathway, seen and evaluated   Chief Complaint: Right lower extremity edema  HPI:   Patient notes severe right lower extremity and groin pain beginning 2 days ago with leg swelling.  Notes her leg feels very "tight ".  States this happened several months ago but was bilateral and was secondary to pneumonia.  Took an extra tablet of Lasix yesterday and today without significant increase in urination or improvement in edema.  She is status post kidney transplant in 2005 and states her creatinine runs around 2.  Travel to [place] in August but not more recently.  His story of DVT or PE.  Denies chest pain or shortness of breath.  States she cannot tolerate contrast for imaging.  Also notes mild right lower quadrant abdominal pain which is where her kidney transplant is.  Denies nausea or vomiting.  ROS: + leg swelling, abd pain  Physical Exam:   Gen: No distress  Neuro: Awake and Alert  Skin: Warm    Focused Exam: Right lower quadrant tender to palpation.  Pitting edema of the right lower extremity with exquisite tenderness to palpation along the medial thigh.  There is firmness to the tissue overlying this area with some warmth but no erythema.  Peripheral pulses faint but dopplerable.   Initiation of care has begun. The patient has been counseled on the process, plan, and necessity for staying for the completion/evaluation, and the remainder of the medical screening examination    Debroah Baller 11/19/17 2125    Margette Fast, MD 11/20/17 1018

## 2017-11-20 ENCOUNTER — Other Ambulatory Visit: Payer: Self-pay

## 2017-11-20 ENCOUNTER — Observation Stay (HOSPITAL_BASED_OUTPATIENT_CLINIC_OR_DEPARTMENT_OTHER): Payer: Medicaid Other

## 2017-11-20 DIAGNOSIS — I82411 Acute embolism and thrombosis of right femoral vein: Secondary | ICD-10-CM | POA: Diagnosis not present

## 2017-11-20 DIAGNOSIS — Z720 Tobacco use: Secondary | ICD-10-CM | POA: Diagnosis present

## 2017-11-20 DIAGNOSIS — Z86718 Personal history of other venous thrombosis and embolism: Secondary | ICD-10-CM | POA: Diagnosis present

## 2017-11-20 DIAGNOSIS — M79604 Pain in right leg: Secondary | ICD-10-CM | POA: Diagnosis present

## 2017-11-20 DIAGNOSIS — F329 Major depressive disorder, single episode, unspecified: Secondary | ICD-10-CM | POA: Diagnosis not present

## 2017-11-20 DIAGNOSIS — N183 Chronic kidney disease, stage 3 unspecified: Secondary | ICD-10-CM | POA: Diagnosis present

## 2017-11-20 DIAGNOSIS — G8929 Other chronic pain: Secondary | ICD-10-CM | POA: Diagnosis not present

## 2017-11-20 DIAGNOSIS — M545 Low back pain: Secondary | ICD-10-CM | POA: Diagnosis not present

## 2017-11-20 DIAGNOSIS — Z940 Kidney transplant status: Secondary | ICD-10-CM | POA: Diagnosis not present

## 2017-11-20 DIAGNOSIS — I82811 Embolism and thrombosis of superficial veins of right lower extremities: Secondary | ICD-10-CM | POA: Diagnosis not present

## 2017-11-20 DIAGNOSIS — I824Y1 Acute embolism and thrombosis of unspecified deep veins of right proximal lower extremity: Secondary | ICD-10-CM | POA: Diagnosis not present

## 2017-11-20 DIAGNOSIS — R52 Pain, unspecified: Secondary | ICD-10-CM

## 2017-11-20 DIAGNOSIS — K219 Gastro-esophageal reflux disease without esophagitis: Secondary | ICD-10-CM | POA: Diagnosis not present

## 2017-11-20 DIAGNOSIS — K3184 Gastroparesis: Secondary | ICD-10-CM | POA: Diagnosis not present

## 2017-11-20 DIAGNOSIS — R609 Edema, unspecified: Secondary | ICD-10-CM | POA: Diagnosis not present

## 2017-11-20 DIAGNOSIS — I639 Cerebral infarction, unspecified: Secondary | ICD-10-CM

## 2017-11-20 DIAGNOSIS — H5461 Unqualified visual loss, right eye, normal vision left eye: Secondary | ICD-10-CM | POA: Diagnosis not present

## 2017-11-20 DIAGNOSIS — M7989 Other specified soft tissue disorders: Secondary | ICD-10-CM | POA: Diagnosis present

## 2017-11-20 DIAGNOSIS — D631 Anemia in chronic kidney disease: Secondary | ICD-10-CM | POA: Diagnosis not present

## 2017-11-20 DIAGNOSIS — Z8673 Personal history of transient ischemic attack (TIA), and cerebral infarction without residual deficits: Secondary | ICD-10-CM | POA: Diagnosis present

## 2017-11-20 DIAGNOSIS — M797 Fibromyalgia: Secondary | ICD-10-CM | POA: Diagnosis not present

## 2017-11-20 HISTORY — DX: Personal history of other venous thrombosis and embolism: Z86.718

## 2017-11-20 LAB — BASIC METABOLIC PANEL
Anion gap: 12 (ref 5–15)
BUN: 30 mg/dL — ABNORMAL HIGH (ref 6–20)
CO2: 21 mmol/L — ABNORMAL LOW (ref 22–32)
Calcium: 9.2 mg/dL (ref 8.9–10.3)
Chloride: 107 mmol/L (ref 98–111)
Creatinine, Ser: 2.03 mg/dL — ABNORMAL HIGH (ref 0.44–1.00)
GFR calc Af Amer: 32 mL/min — ABNORMAL LOW (ref >60)
GFR calc non Af Amer: 27 mL/min — ABNORMAL LOW (ref >60)
Glucose, Bld: 91 mg/dL (ref 70–99)
Potassium: 4.6 mmol/L (ref 3.5–5.1)
Sodium: 140 mmol/L (ref 135–145)

## 2017-11-20 LAB — URINALYSIS, ROUTINE W REFLEX MICROSCOPIC
Bilirubin Urine: NEGATIVE
Glucose, UA: NEGATIVE mg/dL
Hgb urine dipstick: NEGATIVE
Ketones, ur: NEGATIVE mg/dL
Leukocytes, UA: NEGATIVE
Nitrite: NEGATIVE
Protein, ur: NEGATIVE mg/dL
Specific Gravity, Urine: 1.013 (ref 1.005–1.030)
pH: 6 (ref 5.0–8.0)

## 2017-11-20 LAB — PROTIME-INR
INR: 1.06
Prothrombin Time: 13.7 seconds (ref 11.4–15.2)

## 2017-11-20 LAB — CBC
HCT: 34.2 % — ABNORMAL LOW (ref 36.0–46.0)
Hemoglobin: 10.5 g/dL — ABNORMAL LOW (ref 12.0–15.0)
MCH: 28.9 pg (ref 26.0–34.0)
MCHC: 30.7 g/dL (ref 30.0–36.0)
MCV: 94.2 fL (ref 80.0–100.0)
Platelets: 163 10*3/uL (ref 150–400)
RBC: 3.63 MIL/uL — ABNORMAL LOW (ref 3.87–5.11)
RDW: 20.3 % — ABNORMAL HIGH (ref 11.5–15.5)
WBC: 3.3 10*3/uL — ABNORMAL LOW (ref 4.0–10.5)
nRBC: 0 % (ref 0.0–0.2)

## 2017-11-20 LAB — C-REACTIVE PROTEIN: CRP: <0.8 mg/dL (ref <1.0)

## 2017-11-20 LAB — SEDIMENTATION RATE: Sed Rate: 5 mm/hr (ref 0–22)

## 2017-11-20 MED ORDER — WARFARIN - PHARMACIST DOSING INPATIENT: Freq: Every day | Status: DC

## 2017-11-20 MED ORDER — SENNOSIDES-DOCUSATE SODIUM 8.6-50 MG PO TABS: 1.0000 | ORAL_TABLET | Freq: Every evening | ORAL | Status: DC | PRN

## 2017-11-20 MED ORDER — SODIUM CHLORIDE 0.9 % IV SOLN: 1.0000 g | INTRAVENOUS | Status: DC

## 2017-11-20 MED ORDER — SODIUM CHLORIDE 0.9 % IV SOLN
1.0000 g | Freq: Once | INTRAVENOUS | Status: AC
  Administered 2017-11-20: 1 g via INTRAVENOUS
  Filled 2017-11-20: qty 10

## 2017-11-20 MED ORDER — ALBUTEROL SULFATE (2.5 MG/3ML) 0.083% IN NEBU: 3.0000 mL | INHALATION_SOLUTION | Freq: Four times a day (QID) | RESPIRATORY_TRACT | Status: DC | PRN

## 2017-11-20 MED ORDER — TACROLIMUS 1 MG PO CAPS
3.0000 mg | ORAL_CAPSULE | Freq: Every day | ORAL | Status: DC
  Administered 2017-11-20: 3 mg via ORAL
  Filled 2017-11-20: qty 3

## 2017-11-20 MED ORDER — PANTOPRAZOLE SODIUM 40 MG PO TBEC
40.0000 mg | DELAYED_RELEASE_TABLET | Freq: Every day | ORAL | Status: DC
  Administered 2017-11-20: 40 mg via ORAL
  Filled 2017-11-20: qty 1

## 2017-11-20 MED ORDER — LATANOPROST 0.005 % OP SOLN
1.0000 [drp] | Freq: Every day | OPHTHALMIC | Status: DC
  Filled 2017-11-20: qty 2.5

## 2017-11-20 MED ORDER — TACROLIMUS 1 MG PO CAPS
2.0000 mg | ORAL_CAPSULE | Freq: Every day | ORAL | Status: DC
  Filled 2017-11-20: qty 2

## 2017-11-20 MED ORDER — ZOLPIDEM TARTRATE 5 MG PO TABS: 5.0000 mg | ORAL_TABLET | Freq: Every evening | ORAL | Status: DC | PRN

## 2017-11-20 MED ORDER — MORPHINE SULFATE (PF) 2 MG/ML IV SOLN
2.0000 mg | INTRAVENOUS | Status: DC | PRN
  Administered 2017-11-20 (×2): 2 mg via INTRAVENOUS
  Filled 2017-11-20 (×2): qty 1

## 2017-11-20 MED ORDER — METOCLOPRAMIDE HCL 10 MG PO TABS
5.0000 mg | ORAL_TABLET | ORAL | Status: DC
  Administered 2017-11-20: 5 mg via ORAL
  Filled 2017-11-20 (×2): qty 1

## 2017-11-20 MED ORDER — FUROSEMIDE 40 MG PO TABS: 40.0000 mg | ORAL_TABLET | Freq: Every day | ORAL | Status: DC

## 2017-11-20 MED ORDER — OXYCODONE-ACETAMINOPHEN 5-325 MG PO TABS
2.0000 | ORAL_TABLET | Freq: Once | ORAL | Status: AC
  Administered 2017-11-20: 2 via ORAL
  Filled 2017-11-20: qty 2

## 2017-11-20 MED ORDER — NICOTINE 21 MG/24HR TD PT24
21.0000 mg | MEDICATED_PATCH | Freq: Every day | TRANSDERMAL | Status: DC
  Administered 2017-11-20: 21 mg via TRANSDERMAL
  Filled 2017-11-20: qty 1

## 2017-11-20 MED ORDER — PREDNISONE 5 MG PO TABS: 5.0000 mg | ORAL_TABLET | Freq: Every day | ORAL | Status: DC

## 2017-11-20 MED ORDER — ONDANSETRON HCL 4 MG PO TABS: 4.0000 mg | ORAL_TABLET | Freq: Four times a day (QID) | ORAL | Status: DC | PRN

## 2017-11-20 MED ORDER — ENOXAPARIN SODIUM 60 MG/0.6ML ~~LOC~~ SOLN
50.0000 mg | Freq: Once | SUBCUTANEOUS | Status: AC
  Administered 2017-11-20: 50 mg via SUBCUTANEOUS
  Filled 2017-11-20: qty 0.6
  Filled 2017-11-20: qty 0.5

## 2017-11-20 MED ORDER — ENOXAPARIN SODIUM 60 MG/0.6ML ~~LOC~~ SOLN: 50.0000 mg | SUBCUTANEOUS | Status: DC

## 2017-11-20 MED ORDER — ACETAMINOPHEN 650 MG RE SUPP: 650.0000 mg | Freq: Four times a day (QID) | RECTAL | Status: DC | PRN

## 2017-11-20 MED ORDER — WARFARIN SODIUM 5 MG PO TABS: 5.0000 mg | ORAL_TABLET | Freq: Every day | ORAL | 0 refills | Status: DC

## 2017-11-20 MED ORDER — ENOXAPARIN SODIUM 60 MG/0.6ML ~~LOC~~ SOLN: 60.0000 mg | SUBCUTANEOUS | 0 refills | Status: DC

## 2017-11-20 MED ORDER — AZATHIOPRINE 50 MG PO TABS
100.0000 mg | ORAL_TABLET | Freq: Every day | ORAL | Status: DC
  Administered 2017-11-20: 100 mg via ORAL
  Filled 2017-11-20: qty 2

## 2017-11-20 MED ORDER — ONDANSETRON HCL 4 MG/2ML IJ SOLN: 4.0000 mg | Freq: Four times a day (QID) | INTRAMUSCULAR | Status: DC | PRN

## 2017-11-20 MED ORDER — ASPIRIN EC 81 MG PO TBEC
81.0000 mg | DELAYED_RELEASE_TABLET | Freq: Every day | ORAL | Status: DC
  Administered 2017-11-20: 81 mg via ORAL
  Filled 2017-11-20: qty 1

## 2017-11-20 MED ORDER — OXYCODONE-ACETAMINOPHEN 5-325 MG PO TABS
1.0000 | ORAL_TABLET | ORAL | Status: DC | PRN
  Filled 2017-11-20 (×2): qty 1

## 2017-11-20 MED ORDER — NICOTINE 21 MG/24HR TD PT24: 21.0000 mg | MEDICATED_PATCH | Freq: Every day | TRANSDERMAL | 0 refills | Status: DC

## 2017-11-20 MED ORDER — ACETAMINOPHEN 325 MG PO TABS: 650.0000 mg | ORAL_TABLET | Freq: Four times a day (QID) | ORAL | Status: DC | PRN

## 2017-11-20 MED ORDER — PREDNISONE 10 MG PO TABS
10.0000 mg | ORAL_TABLET | Freq: Every day | ORAL | Status: DC
  Administered 2017-11-20: 10 mg via ORAL
  Filled 2017-11-20: qty 1

## 2017-11-20 MED ORDER — CLINDAMYCIN HCL 150 MG PO CAPS: 300.0000 mg | ORAL_CAPSULE | Freq: Once | ORAL | Status: DC

## 2017-11-20 MED ORDER — WARFARIN SODIUM 5 MG PO TABS
5.0000 mg | ORAL_TABLET | Freq: Once | ORAL | Status: AC
  Administered 2017-11-20: 5 mg via ORAL
  Filled 2017-11-20: qty 1

## 2017-11-20 NOTE — ED Provider Notes (Signed)
[facility] EMERGENCY DEPARTMENT Provider Note   CSN: [id number] Arrival date & time: 11/19/17  2008     History   Chief Complaint Chief Complaint  Patient presents with  . Leg Swelling    HPI Tina Watkins is a 52 y.o. female presented for evaluation of right leg swelling.  Patient states that the past 2 days, she has been having right leg pain and swelling.  It is gradually worsening.  Pain is of the entire right leg extending up into the groin/inguinal region.  Patient has a history of kidney transplant in 2005, the transplanted kidney is in the right lower pelvis.  Patient is currently on immunosuppressants and chronic prednisone.  Patient denies fevers, chills, nausea, vomiting, abdominal pain.  She denies swelling of the left leg.  She took Lasix which mildly improved the swelling of her right leg, but has not resolved it.  She reports sniffing amount of pain, takes oxycodone at home for this.  Patient denies chest pain or shortness of breath.  She denies previous DVT/PE.  She is not on blood thinners.  HPI  Past Medical History:  Diagnosis Date  . Bacteremia due to Gram-negative bacteria 05/23/2011  . Blind right eye   . Chronic lower back pain   . Complication of anesthesia    "woke up during OR; I have an extremely high tolerance" (12/11/2011) 1 procedure was graft; the other procedures were procedures that are typically done with sedation.  . DDD (degenerative disc disease), cervical   . Depression   . Dysrhythmia    "tachycardia" (12/11/2011) new onset afib 10/15/14 EKG  . E coli bacteremia 06/18/2011  . ESRD (end stage renal disease) ([place]) 06/12/2011  . Fibromyalgia   . Gastroparesis   . Gastroparesis   . GERD (gastroesophageal reflux disease)   . Glaucoma    right eye  . Gout   . H/O carpal tunnel syndrome   . Headache(784.0)    "not often anymore" (12/11/2011)  . Herpes genitalia 1994  . History of blood transfusion    "more than a few  times" (12/11/2011)  . History of stomach ulcers   . Hypotension   . Iron deficiency anemia   . New onset a-fib ([place])    10/15/14 EKG  . Osteopenia   . Seizures ([place]) 1994   "post transplant; only have had that one" (12/11/2011)  . Spinal stenosis in cervical region   . Stroke [facility])     left basal ganglia lacunar infarct; Right frontal lobe lacunar infarct.  . Stroke [facility]) ~ 1999; 2001   "briefly lost my vision; lost my right eye" (12/11/2011)    Patient Active Problem List   Diagnosis Date Noted  . Tobacco abuse 11/20/2017  . Right leg swelling 11/20/2017  . CKD (chronic kidney disease), stage III ([place]) 11/20/2017  . Right leg pain 11/20/2017  . Stroke (cerebrum) ([place]) 11/20/2017  . GERD (gastroesophageal reflux disease) 11/20/2017  . 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 Thrush, oral   . AKI (acute kidney injury) ([place])   . Multifocal pneumonia 08/09/2017  . Chest pain 09/29/2015  . Cervical stenosis of spinal canal 01/08/2015  . Urinary tract infectious disease   . Muscle spasms of neck 06/15/2014  . Bleeding hemorrhoid 06/15/2014  . Anemia in chronic kidney disease 06/15/2014  . Acute on chronic renal failure ([place]) 06/13/2014  . Pyelonephritis, acute 06/13/2014  . Sepsis ([place]) 06/13/2014  .  History of kidney transplant   . Chronic pain syndrome 04/09/2012  . Dehydration, mild 04/09/2012  . Gout attack 06/23/2011  . Herpes infection 06/23/2011  . Anxiety 06/23/2011  . E coli bacteremia 06/18/2011  . ESRD (end stage renal disease) ([place]) 06/12/2011  . UTI (lower urinary tract infection) 06/12/2011  . Bacteremia due to Gram-negative bacteria 05/23/2011  . History of renal transplantation 05/22/2011  . Septic shock(785.52) 05/22/2011  . Acute on chronic kidney failure ([place]) 05/22/2011  . Gastroparesis 04/24/2008  . WEIGHT LOSS 08/24/2007  . NAUSEA AND VOMITING 08/24/2007    Past Surgical History:  Procedure Laterality Date   . ANTERIOR CERVICAL DECOMP/DISCECTOMY FUSION N/A 01/08/2015   Procedure: Anterior Cervical Three-Four/Four-Five Decompression/Diskectomy/Fusion;  Surgeon: Leeroy Cha, MD;  Location: [facility]URO ORS;  Service: Neurosurgery;  Laterality: N/A;  C3-4 C4-5 Anterior cervical decompression/diskectomy/fusion  . APPENDECTOMY  ~ 2004  . CATARACT EXTRACTION     right eye  . COLONOSCOPY    . ENUCLEATION  2001   "right"  . ESOPHAGOGASTRODUODENOSCOPY (EGD) WITH PROPOFOL N/A 04/21/2012   Procedure: ESOPHAGOGASTRODUODENOSCOPY (EGD) WITH PROPOFOL;  Surgeon: Milus Banister, MD;  Location: [facility] END[facility]COPY;  Service: Endoscopy;  Laterality: N/A;  . 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 [place]; 1999; 2005   "right"  . MULTIPLE TOOTH EXTRACTIONS    . TONSILLECTOMY    . TOTAL NEPHRECTOMY  1988?; 1994; 2005     OB History   None      Home Medications    Prior to Admission medications   Medication Sig Start Date End Date Taking? Authorizing Provider  aspirin EC 81 MG tablet Take 81 mg by mouth daily.    Yes [provider]  azaTHIOprine (IMURAN) 50 MG tablet Take 100 mg by mouth daily.    Yes [provider]  furosemide (LASIX) 40 MG tablet Take 40 mg by mouth at bedtime.  05/09/17  Yes [provider]  latanoprost (XALATAN) 0.005 % ophthalmic solution Place 1 drop into the left eye at bedtime. 03/30/17  Yes [provider]  metoCLOPramide (REGLAN) 5 MG tablet TAKE 1 TABLET BY MOUTH TWICE A DAY AT 8AM AND 10 PM Patient taking differently: Take 5 mg by mouth 2 (two) times daily.  08/06/17  Yes Milus Banister, MD  omeprazole (PRILOSEC) 40 MG capsule Take 1 capsule (40 mg total) by mouth daily. Patient taking differently: Take 40 mg by mouth 2 (two) times daily.  08/17/17  Yes Riccio, Gardiner Rhyme, DO  oxyCODONE-acetaminophen (PERCOCET) 10-325 MG tablet Take 1 tablet by mouth every 6 (six) hours as  needed for pain.   Yes [provider]  predniSONE (DELTASONE) 5 MG tablet Take 5 mg by mouth daily. 06/22/11  Yes [name], MD  PROVENTIL HFA 108 [phone] Base) MCG/ACT inhaler Inhale 2 puffs into the lungs 4 (four) times daily as needed for shortness of breath. 08/04/17  Yes [provider]  tacrolimus (PROGRAF) 1 MG capsule Take 2-3 mg by mouth See admin instructions. Take 3 capsules every morning and take 2 capsules every evening 06/22/11  Yes [name], MD  zolpidem (AMBIEN) 10 MG tablet Take 10 mg by mouth at bedtime as needed for sleep.   Yes [provider]  fluticasone (FLONASE) 50 MCG/ACT nasal spray Place 1 spray into both nostrils daily. Patient not taking: Reported on 11/20/2017 07/18/17  Ryleigh Buenger, PA-C  ondansetron (ZOFRAN ODT) 4 MG disintegrating tablet Take 1 tablet (4 mg total) by mouth every 6 (six) hours as needed. Patient not taking: Reported on 11/20/2017 06/05/17   Ward, Delice Bison, DO  predniSONE (DELTASONE) 20 MG tablet Take 2 tablets (40 mg total) by mouth daily with breakfast. Decrease dose by 10 mg (1/2 tab) every 3 days then go back to 5 mg daily Patient not taking: Reported on 11/20/2017 08/18/17   Steve Rattler, DO    Family History Family History  Problem Relation Age of Onset  . Glaucoma Mother   . Multiple sclerosis Brother   . Hypertension Maternal Grandmother   . Breast cancer Neg Hx     Social History Social History   Tobacco Use  . Smoking status: Current Every Day Smoker    Packs/day: 0.20    Years: 28.00    Pack years: 5.60    Types: Cigarettes  . Smokeless tobacco: Never Used  . Tobacco comment: had quit 03/05/2011  Substance Use Topics  . Alcohol use: Yes    Comment: rare  . Drug use: Yes    Types: Marijuana     Allergies   Levofloxacin   Review of Systems Review of Systems  Cardiovascular: Positive for leg swelling.  Skin: Positive for color change (Right leg red, swollen, and  tender).  All other systems reviewed and are negative.    Physical Exam Updated Vital Signs BP 101/68 (BP Location: Left Arm)   Pulse 66   Temp 98 F (36.7 C) (Oral)   Resp 18   Ht 4\' 11"  (1.499 m)   Wt 52.6 kg   SpO2 99%   BMI 23.43 kg/m   Physical Exam  Constitutional: She is oriented to person, place, and time. She appears well-developed and well-nourished. No distress.  Appears uncomfortable due to pain, but nontoxic  HENT:  Head: Normocephalic and atraumatic.  Eyes: Pupils are equal, round, and reactive to light. Conjunctivae and EOM are normal.  Neck: Normal range of motion. Neck supple.  Cardiovascular: Normal rate, regular rhythm and intact distal pulses.  Pulmonary/Chest: Effort normal and breath sounds normal. No respiratory distress. She has no wheezes.  Abdominal: Soft. She exhibits no distension and no mass. There is tenderness. There is no rebound and no guarding.  Tenderness palpation of right inguinal region.  No tenderness palpation of the abdomen.  Musculoskeletal: Normal range of motion. She exhibits edema and tenderness. She exhibits no deformity.  Right leg circumferentially tender, indurated, warm, and erythematous.  Pedal pulses dopplerable but present bilaterally. Sensation intact. Full ROM  Neurological: She is alert and oriented to person, place, and time. No sensory deficit.  Skin: Skin is warm and dry. Capillary refill takes less than 2 seconds. There is erythema.  Psychiatric: She has a normal mood and affect.  Nursing note and vitals reviewed.    ED Treatments / Results  Labs (all labs ordered are listed, but only abnormal results are displayed) Labs Reviewed  COMPREHENSIVE METABOLIC PANEL - Abnormal; Notable for the following components:      Result Value   BUN 27 (*)    Creatinine, Ser 2.03 (*)    AST 14 (*)    GFR calc non Af Amer 27 (*)    GFR calc Af Amer 32 (*)    All other components within normal limits  CBC WITH  DIFFERENTIAL/PLATELET - Abnormal; Notable for the following components:   RBC 3.83 (*)    Hemoglobin  11.6 (*)    RDW 20.5 (*)    Lymphs Abs 0.4 (*)    All other components within normal limits  BASIC METABOLIC PANEL - Abnormal; Notable for the following components:   CO2 21 (*)    BUN 30 (*)    Creatinine, Ser 2.03 (*)    GFR calc non Af Amer 27 (*)    GFR calc Af Amer 32 (*)    All other components within normal limits  CBC - Abnormal; Notable for the following components:   WBC 3.3 (*)    RBC 3.63 (*)    Hemoglobin 10.5 (*)    HCT 34.2 (*)    RDW 20.3 (*)    All other components within normal limits  CULTURE, BLOOD (ROUTINE X 2)  CULTURE, BLOOD (ROUTINE X 2)  URINALYSIS, ROUTINE W REFLEX MICROSCOPIC  C-REACTIVE PROTEIN  SEDIMENTATION RATE  PROTIME-INR  TACROLIMUS LEVEL    EKG None  Radiology No results found.  Procedures Procedures (including critical care time)  Medications Ordered in ED Medications  cefTRIAXone (ROCEPHIN) 1 g in sodium chloride 0.9 % 100 mL IVPB (has no administration in time range)  oxyCODONE-acetaminophen (PERCOCET/ROXICET) 5-325 MG per tablet 1 tablet (has no administration in time range)  aspirin EC tablet 81 mg (has no administration in time range)  furosemide (LASIX) tablet 40 mg (has no administration in time range)  zolpidem (AMBIEN) tablet 5 mg (has no administration in time range)  metoCLOPramide (REGLAN) tablet 5 mg (has no administration in time range)  pantoprazole (PROTONIX) EC tablet 40 mg (has no administration in time range)  azaTHIOprine (IMURAN) tablet 100 mg (has no administration in time range)  tacrolimus (PROGRAF) capsule 3 mg (has no administration in time range)  albuterol (PROVENTIL) (2.5 MG/3ML) 0.083% nebulizer solution 3 mL (has no administration in time range)  latanoprost (XALATAN) 0.005 % ophthalmic solution 1 drop (has no administration in time range)  morphine 2 MG/ML injection 2 mg (2 mg Intravenous Given  11/20/17 0350)  predniSONE (DELTASONE) tablet 10 mg (has no administration in time range)  nicotine (NICODERM CQ - dosed in mg/24 hours) patch 21 mg (has no administration in time range)  acetaminophen (TYLENOL) tablet 650 mg (has no administration in time range)    Or  acetaminophen (TYLENOL) suppository 650 mg (has no administration in time range)  senna-docusate (Senokot-S) tablet 1 tablet (has no administration in time range)  ondansetron (ZOFRAN) tablet 4 mg (has no administration in time range)    Or  ondansetron (ZOFRAN) injection 4 mg (has no administration in time range)  tacrolimus (PROGRAF) capsule 2 mg (has no administration in time range)  oxyCODONE-acetaminophen (PERCOCET/ROXICET) 5-325 MG per tablet 2 tablet (2 tablets Oral Given 11/20/17 0140)  cefTRIAXone (ROCEPHIN) 1 g in sodium chloride 0.9 % 100 mL IVPB (0 g Intravenous Stopped 11/20/17 0243)  enoxaparin (LOVENOX) injection 50 mg (50 mg Subcutaneous Given 11/20/17 0350)     Initial Impression / Assessment and Plan / ED Course  I have reviewed the triage vital signs and the nursing notes.  Pertinent labs & imaging results that were available during my care of the patient were reviewed by me and considered in my medical decision making (see chart for details).     Pr presenting for evaluation of right leg pain and swelling.  Physical exam shows patient is afebrile not tachycardic.  Appears nontoxic.  However, I am concerned about patient's circumferential indurated, warm, erythematous, and tender right leg.  Concern for possible  cellulitis versus DVT.  Patient is immunosuppressed, history of kidney transplantation.  Labs without acute findings, no leukocytosis.  However, patient is on chronic prednisone, could prevent leukocytosis.  Creatinine stable around 2.  Discussed with attending, Dr. Stark Jock evaluated the patient.  Will start IV antibiotics, order vascular ultrasound for tomorrow morning, and call for  admission.  Discussed with Dr. Blaine Hamper from [facility], patient to be admitted for IV antibiotics.   Final Clinical Impressions(s) / ED Diagnoses   Final diagnoses:  Right leg swelling    ED Discharge Orders    None       Franchot Heidelberg, PA-C 11/20/17 [id number]    Veryl Speak, MD 11/20/17 2303

## 2017-11-20 NOTE — ED Notes (Signed)
EDP at bedside  

## 2017-11-20 NOTE — Discharge Summary (Signed)
Physician Discharge Summary  Tina Watkins OFB:[id number] DOB: 12-03-1965 DOA: 11/19/2017  PCP: Sandi Mariscal, MD  Admit date: 11/19/2017 Discharge date: 11/20/2017  Admitted From: Home Disposition: Home  Recommendations for Outpatient Follow-up:  1. Follow up with PCP in 4 days on Wednesday for follow-up of PT and INR 2. Please obtain PT and INR on Wednesday morning  Home Health: No Equipment/Devices: None  Discharge Condition: Stable CODE STATUS: Full code Diet recommendation: Heart Healthy   Brief/Interim Summary: Tina Watkins is a 52 y.o. female with medical history significant of kidney transplantation on immunosuppressants, CKD 3, stroke, GERD, depression, right eye blindness, gastroparesis, iron deficiency anemia, tobacco abuse, who presents with right leg pain and swelling.  Patient states that she started having right leg swelling 3 days ago, which has been progressively getting worse.  She started hurting 2 days in right leg, which has also been progressively worsening.  Currently the pain is constant, 10 out of 10 severity, sharp, nonradiating.  The right leg swelling is now involving the the whole leg from ankle up to upper thigh. It is also erythematous and warm.  Patient states that she feels like she has low-grade fever, but her temperature is 99.3 in ED.  No chills.  No chest pain, shortness of breath.  She has mild cough which she attributes to smoking.  No nausea, vomiting, diarrhea, abdominal pain, symptoms of UTI. had traveling to [place] in August but not more recently.   Patient venous Doppler was positive for acute DVT in the common femoral vein and a superficial thrombosis noted in the greater saphenous vein.  It is familiar with giving herself Lovenox injections as she used to do it when she was on peritoneal dialysis.  She is also taken warfarin in the distant past although she does not remember why.  We will start taking warfarin 5 mg p.o. every afternoon.   She is not a candidate for novel oral anticoagulant due to her insufficiency with a creatinine of approximately 2.03.  Patient would like for Dr. Raquel Sarna to follow her PT and INR.  I am unable to confirm that he can do that.  I will call the patient on Monday after I speak to him regarding her PT and INR checks.  If he cannot do that we will refer her to her primary physician.  She will inject herself with Lovenox 60 mg (ideal dose is 50 mg however prefilled syringes only, and 60 or 40 mg doses) subcu daily.  Will also take warfarin 5 mg p.o. daily.  She will get a PT and INR checked Wednesday morning.  Patient has reached maximal benefit of hospitalization.  Discharge diagnosis, prognosis, plans, follow-up, medications and treatments discussed with the patient(or responsible party) and is in agreement with the plans as described.  Patient is stable for discharge.  Discharge Diagnoses:  Principal Problem:   Acute venous embolism and thrombosis of deep vessels of proximal lower extremity, right (HCC) Active Problems:   History of renal transplantation   Anemia in chronic kidney disease   Tobacco abuse   Right leg swelling   CKD (chronic kidney disease), stage III (HCC)   Right leg pain   Stroke (cerebrum) (HCC)   GERD (gastroesophageal reflux disease)    Discharge Instructions  Discharge Instructions    Diet - low sodium heart healthy   Complete by:  As directed    Diet - low sodium heart healthy   Complete by:  As directed  Discharge instructions   Complete by:  As directed    Use lovenox daily as prescribed until warfarin level is adequate. Get blood work checked as prescribed Take warfarin daily at 6pm   Discharge instructions   Complete by:  As directed    Get PT and INR checked at [facility] in Wednesday morning.  Have results sent to your doctor   Increase activity slowly   Complete by:  As directed    Increase activity slowly   Complete by:  As directed       Allergies as of 11/20/2017      Reactions   Levofloxacin Itching, Rash      Medication List    TAKE these medications   aspirin EC 81 MG tablet Take 81 mg by mouth daily.   azaTHIOprine 50 MG tablet Commonly known as:  IMURAN Take 100 mg by mouth daily.   enoxaparin 60 MG/0.6ML injection Commonly known as:  LOVENOX Inject 0.6 mLs (60 mg total) into the skin daily for 7 days. Start taking on:  11/21/2017   fluticasone 50 MCG/ACT nasal spray Commonly known as:  FLONASE Place 1 spray into both nostrils daily.   furosemide 40 MG tablet Commonly known as:  LASIX Take 40 mg by mouth at bedtime.   latanoprost 0.005 % ophthalmic solution Commonly known as:  XALATAN Place 1 drop into the left eye at bedtime.   metoCLOPramide 5 MG tablet Commonly known as:  REGLAN TAKE 1 TABLET BY MOUTH TWICE A DAY AT 8AM AND 10 PM What changed:    how much to take  how to take this  when to take this  additional instructions   nicotine 21 mg/24hr patch Commonly known as:  NICODERM CQ - dosed in mg/24 hours Place 1 patch (21 mg total) onto the skin daily. Start taking on:  11/21/2017   omeprazole 40 MG capsule Commonly known as:  PRILOSEC Take 1 capsule (40 mg total) by mouth daily. What changed:  when to take this   ondansetron 4 MG disintegrating tablet Commonly known as:  ZOFRAN-ODT Take 1 tablet (4 mg total) by mouth every 6 (six) hours as needed.   oxyCODONE-acetaminophen 10-325 MG tablet Commonly known as:  PERCOCET Take 1 tablet by mouth every 6 (six) hours as needed for pain.   predniSONE 5 MG tablet Commonly known as:  DELTASONE Take 5 mg by mouth daily. What changed:  Another medication with the same name was removed. Continue taking this medication, and follow the directions you see here.   PROVENTIL HFA 108 (90 Base) MCG/ACT inhaler Generic drug:  albuterol Inhale 2 puffs into the lungs 4 (four) times daily as needed for shortness of breath.   tacrolimus 1  MG capsule Commonly known as:  PROGRAF Take 2-3 mg by mouth See admin instructions. Take 3 capsules every morning and take 2 capsules every evening   warfarin 5 MG tablet Commonly known as:  COUMADIN Take 1 tablet (5 mg total) by mouth daily at 6 PM for 15 days.   zolpidem 10 MG tablet Commonly known as:  AMBIEN Take 10 mg by mouth at bedtime as needed for sleep.       Allergies  Allergen Reactions  . Levofloxacin Itching and Rash       Procedures/Studies: Right lower extremity venous Dopplers positive for DVT noted above   Subjective: Patient feels well no new complaints.  Does not feel she will require narcotic pain medications at discharge.  Discharge Exam:  Vitals:   11/20/17 0919 11/20/17 1516  BP: 109/61 112/69  Pulse: 61 63  Resp: 16 16  Temp: 98 F (36.7 C) 98.2 F (36.8 C)  SpO2: 99% 99%   Vitals:   11/20/17 0302 11/20/17 0513 11/20/17 0919 11/20/17 1516  BP: 109/70 101/68 109/61 112/69  Pulse: 60 66 61 63  Resp: 18 18 16 16   Temp: 98.1 F (36.7 C) 98 F (36.7 C) 98 F (36.7 C) 98.2 F (36.8 C)  TempSrc: Oral Oral Oral Oral  SpO2: 98% 99% 99% 99%  Weight: 52.6 kg     Height: 4\' 11"  (1.499 m)       General: Pt is alert, awake, not in acute distress Cardiovascular: RRR, S1/S2 +, no rubs, no gallops Respiratory: CTA bilaterally, no wheezing, no rhonchi Abdominal: Soft, NT, ND, bowel sounds + Extremities: no edema, no cyanosis    The results of significant diagnostics from this hospitalization (including imaging, microbiology, ancillary and laboratory) are listed below for reference.     Microbiology: No results found for this or any previous visit (from the past 240 hour(s)).   Labs: BNP (last 3 results) No results for input(s): BNP in the last 8760 hours. Basic Metabolic Panel: Recent Labs  Lab 11/19/17 2059 11/20/17 0407  NA 141 140  K 4.9 4.6  CL 109 107  CO2 24 21*  GLUCOSE 99 91  BUN 27* 30*  CREATININE 2.03* 2.03*   CALCIUM 9.4 9.2   Liver Function Tests: Recent Labs  Lab 11/19/17 2059  AST 14*  ALT 9  ALKPHOS 112  BILITOT 0.6  PROT 6.8  ALBUMIN 4.1   No results for input(s): LIPASE, AMYLASE in the last 168 hours. No results for input(s): AMMONIA in the last 168 hours. CBC: Recent Labs  Lab 11/19/17 0944 11/19/17 2059 11/20/17 0407  WBC  --  4.2 3.3*  NEUTROABS  --  3.4  --   HGB 11.8* 11.6* 10.5*  HCT  --  36.6 34.2*  MCV  --  95.6 94.2  PLT  --  165 163   Urinalysis    Component Value Date/Time   COLORURINE YELLOW 11/19/2017 2054   APPEARANCEUR CLEAR 11/19/2017 2054   LABSPEC 1.013 11/19/2017 2054   PHURINE 6.0 11/19/2017 2054   GLUCOSEU NEGATIVE 11/19/2017 2054   HGBUR NEGATIVE 11/19/2017 2054   [place] NEGATIVE 11/19/2017 2054   [place] 11/19/2017 2054   PROTEINUR NEGATIVE 11/19/2017 2054   UROBILINOGEN 0.2 10/15/2014 0946   NITRITE NEGATIVE 11/19/2017 2054   LEUKOCYTESUR NEGATIVE 11/19/2017 2054     Time coordinating discharge: 49 minutes  SIGNED:   Lady Deutscher, MD  FACP [facility] 11/20/2017, 4:35 PM Pager   If 7PM-7AM, please contact night-coverage [email or web address] Password [email or web address]

## 2017-11-20 NOTE — Progress Notes (Signed)
VASCULAR LAB PRELIMINARY  PRELIMINARY  PRELIMINARY  PRELIMINARY  Right lower extremity venous duplex completed.    Preliminary report:  There is acute DVT noted in the right common femoral vein.  There is superficial thrombosis noted in the greater saphenous vein.  Unable to visualize the iliac veins, secondary to transplanted kidney and body habitus.  Gave report to [place], RN  Edd Reppert, [place], RVT 11/20/2017, 11:47 AM

## 2017-11-20 NOTE — Progress Notes (Signed)
Assumed care on pt. , pt. resting on bed with no distress, respirations unlabored , IV site intact , plan of care explained to pt., reports persistent pain at right groin with right leg swelling , distal pulses faint .

## 2017-11-20 NOTE — H&P (Signed)
History and Physical    Tina Watkins GGY:[id number] DOB: 1966-01-12 DOA: 11/19/2017  Referring MD/NP/PA:   PCP: Donato Heinz, MD   Patient coming from:  The patient is coming from home.  At baseline, pt is independent for most of ADL.  Chief Complaint: right leg pain and swelling.  HPI: Tina Watkins is a 52 y.o. female with medical history significant of kidney transplantation on immunosuppressants, CKD 3, stroke, GERD, depression, right eye blindness, gastroparesis, iron deficiency anemia, tobacco abuse, who presents with right leg pain and swelling.  Patient states that she started having right leg swelling 3 days ago, which has been progressively getting worse.  She started hurting 2 days in right leg, which has also been progressively worsening.  Currently the pain is constant, 10 out of 10 severity, sharp, nonradiating.  The right leg swelling is now involving the the whole leg from ankle up to upper thigh. It is also erythematous and warm.  Patient states that she feels like she has low-grade fever, but her temperature is 99.3 in ED.  No chills.  No chest pain, shortness of breath.  She has mild cough which she attributes to smoking.  No nausea, vomiting, diarrhea, abdominal pain, symptoms of UTI. had traveling to [place] in August but not more recently.   ED Course: pt was found to have WBC 4.2, stable renal function, temperature 99.3, heart rate 62, RR 20, oxygen saturation 100% on room air.  Patient is placed on MedSurg [place] for observation.   Review of Systems:   General: no fevers, chills, no body weight gain, has fatigue HEENT: has right eye blindness. No noted hearing changes or sore throat Respiratory: no dyspnea, has coughing, no wheezing CV: no chest pain, no palpitations GI: no nausea, vomiting, abdominal pain, diarrhea, constipation GU: no dysuria, burning on urination, increased urinary frequency, hematuria  Ext: has right leg edema and pain Neuro: no  unilateral weakness, numbness, or tingling, no vision change or hearing loss Skin: no rash, no skin tear. MSK: No muscle spasm, no deformity, no limitation of range of movement in spin Heme: No easy bruising.  Travel history: No recent long distant travel.  Allergy:  Allergies  Allergen Reactions  . Levofloxacin Itching and Rash    Past Medical History:  Diagnosis Date  . Bacteremia due to Gram-negative bacteria 05/23/2011  . Blind right eye   . Chronic lower back pain   . Complication of anesthesia    "woke up during OR; I have an extremely high tolerance" (12/11/2011) 1 procedure was graft; the other procedures were procedures that are typically done with sedation.  . DDD (degenerative disc disease), cervical   . Depression   . Dysrhythmia    "tachycardia" (12/11/2011) new onset afib 10/15/14 EKG  . E coli bacteremia 06/18/2011  . ESRD (end stage renal disease) ([place]) 06/12/2011  . Fibromyalgia   . Gastroparesis   . Gastroparesis   . GERD (gastroesophageal reflux disease)   . Glaucoma    right eye  . Gout   . H/O carpal tunnel syndrome   . Headache(784.0)    "not often anymore" (12/11/2011)  . Herpes genitalia 1994  . History of blood transfusion    "more than a few times" (12/11/2011)  . History of stomach ulcers   . Hypotension   . Iron deficiency anemia   . New onset a-fib ([place])    10/15/14 EKG  . Osteopenia   . Seizures ([place]) 1994   "post transplant; only  have had that one" (12/11/2011)  . Spinal stenosis in cervical region   . Stroke [facility])     left basal ganglia lacunar infarct; Right frontal lobe lacunar infarct.  . Stroke [facility]) ~ 1999; 2001   "briefly lost my vision; lost my right eye" (12/11/2011)    Past Surgical History:  Procedure Laterality Date  . ANTERIOR CERVICAL DECOMP/DISCECTOMY FUSION N/A 01/08/2015   Procedure: Anterior Cervical Three-Four/Four-Five Decompression/Diskectomy/Fusion;  Surgeon: Leeroy Cha, MD;  Location: [facility] ORS;  Service:  Neurosurgery;  Laterality: N/A;  C3-4 C4-5 Anterior cervical decompression/diskectomy/fusion  . APPENDECTOMY  ~ 2004  . CATARACT EXTRACTION     right eye  . COLONOSCOPY    . ENUCLEATION  2001   "right"  . ESOPHAGOGASTRODUODENOSCOPY (EGD) WITH PROPOFOL N/A 04/21/2012   Procedure: ESOPHAGOGASTRODUODENOSCOPY (EGD) WITH PROPOFOL;  Surgeon: Milus Banister, MD;  Location: [facility] ENDOSCOPY;  Service: Endoscopy;  Laterality: N/A;  . 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 [place]; 1999; 2005   "right"  . MULTIPLE TOOTH EXTRACTIONS    . TONSILLECTOMY    . [place]?; 1994; 2005    Social History:  reports that she has been smoking cigarettes. She has a 5.60 pack-year smoking history. She has never used smokeless tobacco. She reports that she drinks alcohol. She reports that she has current or past drug history. Drug: Marijuana.  Family History:  Family History  Problem Relation Age of Onset  . Glaucoma Mother   . Multiple sclerosis Brother   . Hypertension Maternal Grandmother   . Breast cancer Neg Hx      Prior to Admission medications   Medication Sig Start Date End Date Taking? Authorizing Provider  aspirin EC 81 MG tablet Take 81 mg by mouth daily.    Yes [provider]  azaTHIOprine (IMURAN) 50 MG tablet Take 100 mg by mouth daily.    Yes [provider]  furosemide (LASIX) 40 MG tablet Take 40 mg by mouth at bedtime.  05/09/17  Yes [provider]  latanoprost (XALATAN) 0.005 % ophthalmic solution Place 1 drop into the left eye at bedtime. 03/30/17  Yes [provider]  metoCLOPramide (REGLAN) 5 MG tablet TAKE 1 TABLET BY MOUTH TWICE A DAY AT 8AM AND 10 PM Patient taking differently: Take 5 mg by mouth 2 (two) times daily.  08/06/17  Yes Milus Banister, MD  omeprazole (PRILOSEC) 40 MG capsule Take 1 capsule (40 mg total) by mouth daily. Patient taking  differently: Take 40 mg by mouth 2 (two) times daily.  08/17/17  Yes Riccio, Gardiner Rhyme, DO  oxyCODONE-acetaminophen (PERCOCET) 10-325 MG tablet Take 1 tablet by mouth every 6 (six) hours as needed for pain.   Yes [provider]  predniSONE (DELTASONE) 5 MG tablet Take 5 mg by mouth daily. 06/22/11  Yes [name], MD  PROVENTIL HFA [phone] Base) MCG/ACT inhaler Inhale 2 puffs into the lungs 4 (four) times daily as needed for shortness of breath. 08/04/17  Yes [provider]  tacrolimus (PROGRAF) 1 MG capsule Take 2-3 mg by mouth See admin instructions. Take 3 capsules every morning and take 2 capsules every evening 06/22/11  Yes [name], MD  zolpidem (AMBIEN) 10 MG tablet Take 10 mg by mouth at bedtime as needed for sleep.   Yes [provider]  fluticasone (FLONASE) 50 MCG/ACT nasal  spray Place 1 spray into both nostrils daily. Patient not taking: Reported on 11/20/2017 07/18/17   Caccavale, Sophia, PA-C  ondansetron (ZOFRAN ODT) 4 MG disintegrating tablet Take 1 tablet (4 mg total) by mouth every 6 (six) hours as needed. Patient not taking: Reported on 11/20/2017 06/05/17   Ward, Delice Bison, DO  predniSONE (DELTASONE) 20 MG tablet Take 2 tablets (40 mg total) by mouth daily with breakfast. Decrease dose by 10 mg (1/2 tab) every 3 days then go back to 5 mg daily Patient not taking: Reported on 11/20/2017 08/18/17   Steve Rattler, DO    Physical Exam: Vitals:   11/19/17 2236 11/20/17 0102 11/20/17 0302 11/20/17 0513  BP: 93/64 102/72 109/70 101/68  Pulse: 68 62 60 66  Resp: '18  18 18  '$ Temp:   98.1 F (36.7 C) 98 F (36.7 C)  TempSrc:   Oral Oral  SpO2: 100% 100% 98% 99%  Weight:   52.6 kg   Height:   '4\' 11"'$  (1.499 m)    General: Not in acute distress HEENT:       Eyes: PERRL, EOMI, no scleral icterus.       ENT: No discharge from the ears and nose, no pharynx injection, no tonsillar enlargement.        Neck: No JVD, no bruit, no mass  felt. Heme: No neck lymph node enlargement. Cardiac: S1/S2, RRR, No murmurs, No gallops or rubs. Respiratory:  No rales, wheezing, rhonchi or rubs. GI: Soft, nondistended, nontender, no rebound pain, no organomegaly, BS present. GU: No hematuria Ext: 2+DP/PT pulse bilaterally. The the whole right leg is swelling, erythematous, warm Musculoskeletal: No joint deformities, No joint redness or warmth, no limitation of ROM in spin. Skin: No rashes.  Neuro: Alert, oriented X3, cranial nerves II-XII grossly intact, moves all extremities normally. Psych: Patient is not psychotic, no suicidal or hemocidal ideation.  Labs on Admission: I have personally reviewed following labs and imaging studies  CBC: Recent Labs  Lab 11/19/17 0944 11/19/17 2059 11/20/17 0407  WBC  --  4.2 3.3*  NEUTROABS  --  3.4  --   HGB 11.8* 11.6* 10.5*  HCT  --  36.6 34.2*  MCV  --  95.6 94.2  PLT  --  165 357   Basic Metabolic Panel: Recent Labs  Lab 11/19/17 2059 11/20/17 0407  NA 141 140  K 4.9 4.6  CL 109 107  CO2 24 21*  GLUCOSE 99 91  BUN 27* 30*  CREATININE 2.03* 2.03*  CALCIUM 9.4 9.2   GFR: Estimated Creatinine Clearance: 24.6 mL/min (A) (by C-G formula based on SCr of 2.03 mg/dL (H)). Liver Function Tests: Recent Labs  Lab 11/19/17 2059  AST 14*  ALT 9  ALKPHOS 112  BILITOT 0.6  PROT 6.8  ALBUMIN 4.1   No results for input(s): LIPASE, AMYLASE in the last 168 hours. No results for input(s): AMMONIA in the last 168 hours. Coagulation Profile: Recent Labs  Lab 11/20/17 0407  INR 1.06   Cardiac Enzymes: No results for input(s): CKTOTAL, CKMB, CKMBINDEX, TROPONINI in the last 168 hours. BNP (last 3 results) No results for input(s): PROBNP in the last 8760 hours. HbA1C: No results for input(s): HGBA1C in the last 72 hours. CBG: No results for input(s): GLUCAP in the last 168 hours. Lipid Profile: No results for input(s): CHOL, HDL, LDLCALC, TRIG, CHOLHDL, LDLDIRECT in the last  72 hours. Thyroid Function Tests: No results for input(s): TSH, T4TOTAL, FREET4, T3FREE, THYROIDAB in  the last 72 hours. Anemia Panel: No results for input(s): VITAMINB12, FOLATE, FERRITIN, TIBC, IRON, RETICCTPCT in the last 72 hours. Urine analysis:    Component Value Date/Time   COLORURINE YELLOW 11/19/2017 2054   APPEARANCEUR CLEAR 11/19/2017 2054   LABSPEC 1.013 11/19/2017 2054   PHURINE 6.0 11/19/2017 2054   GLUCOSEU NEGATIVE 11/19/2017 2054   HGBUR NEGATIVE 11/19/2017 2054   [place] NEGATIVE 11/19/2017 2054   [place] NEGATIVE 11/19/2017 2054   PROTEINUR NEGATIVE 11/19/2017 2054   UROBILINOGEN 0.2 10/15/2014 0946   NITRITE NEGATIVE 11/19/2017 2054   LEUKOCYTESUR NEGATIVE 11/19/2017 2054   Sepsis Labs: '@LABRCNTIP'$ (procalcitonin:4,lacticidven:4) )No results found for this or any previous visit (from the past 240 hour(s)).   Radiological Exams on Admission: No results found.   EKG: Not done in ED, will get one.   Assessment/Plan Principal Problem:   Right leg swelling Active Problems:   History of renal transplantation   Anemia in chronic kidney disease   Tobacco abuse   CKD (chronic kidney disease), stage III (HCC)   Right leg pain   Stroke (cerebrum) (HCC)   GERD (gastroesophageal reflux disease)   Right leg swelling and pain: The right whole leg is extensively swelling, tender, warm and erythematous.  Differential diagnosis is cellulitis versus DVT.  Currently cannot tell them apart.  -Placed on MedSurg bed for observation. -Start IV Rocephin empirically -Blood culture, ESR and CRP -Give 1 dose of Lovenox (I discussed with pharmacist, and asked pharmacist to dose Lovenox) -Follow-up lower extremity Doppler to decide if blood thinner needs to be continued -Pain control: PRN Percocet and morphine  History of renal transplantation: -Continue tacrolimus, Imuran -increased dose of prednisone from 5 to 10 mg daily due to stress -Check tacrolimus  level  Anemia in chronic kidney disease: Hemoglobin stable, 11.6 - Follow-up with CBC  Tobacco abuse: -Nicotine patch  CKD (chronic kidney disease), stage III ([place]): Stable.  Baseline creatinine is ~2.0, his creatinine is at 2.03, BUN 27, which is at baseline. -Follow-up renal function by BMP  Stroke (cerebrum) ([place]): -Continue aspirin  GERD (gastroesophageal reflux disease); Protonix   DVT ppx: full dose Lovenox x 1 (if pt is found to have DVT-->will continue blood thinner; if no DVT on  LE doppler-->will need to switch to sq Heparin tomorrow). Code Status: Full code Family Communication: None at bed side.    Disposition Plan:  Anticipate discharge back to previous home environment Consults called:  none Admission status:   medical floor/obs       Date of Service 11/20/2017    Ivor Costa [facility] Pager [phone]  If 7PM-7AM, please contact night-coverage [email or web address] Password [email or web address] 11/20/2017, 6:50 AM

## 2017-11-20 NOTE — Progress Notes (Signed)
ANTICOAGULATION CONSULT NOTE - Follow-Up Consult  Pharmacy Consult for Enoxaparin + Warfarin Indication: RLE DVT  Patient Measurements: Height: 4\' 11"  (149.9 cm) Weight: 116 lb (52.6 kg) IBW/kg (Calculated) : 43.2 Heparin Dosing Weight: 52 kg  Vital Signs: Temp: 98 F (36.7 C) (10/19 0919) Temp Source: Oral (10/19 0919) BP: 109/61 (10/19 0919) Pulse Rate: 61 (10/19 0919)  Labs: Recent Labs    11/19/17 2059 11/20/17 0407  HGB 11.6* 10.5*  HCT 36.6 34.2*  PLT 165 163  LABPROT  --  13.7  INR  --  1.06  CREATININE 2.03* 2.03*    Estimated Creatinine Clearance: 24.6 mL/min (A) (by C-G formula based on SCr of 2.03 mg/dL (H)).  Assessment: 81 YOF who presented on 10/19 with RLE pain swelling concerning for cellulitis vs DVT. Dopplers have now confirmed RLE DVT and pharmacy has been consulted to continue Lovenox dosing and start Warfarin.   Baseline INR 1.06, Hgb/Hct slight drop, plts wnl. SCr 2.03, CrCl<30 ml/min. Warf points ~6 - but will start dosing lower due to low weight. Today is VTE overlap D#1/5  Goal of Therapy:  Anti-Xa level 0.6-1 units/ml 4hrs after LMWH dose given Monitor platelets by anticoagulation protocol: Yes   Plan:  - Continue Lovenox 50 mg SQ every 24 hours - Warfarin 5 mg x 1 dose at 1800 today - Daily PT/INR - Will continue to monitor for any signs/symptoms of bleeding and will follow up with PT/INR in the a.m.   Thank you for allowing pharmacy to be a part of this patient's care.  Alycia Rossetti, PharmD, BCPS Clinical Pharmacist Pager: [phone] Clinical phone for 11/20/2017 from 7a-3:30p: [phone] If after 3:30p, please call main pharmacy at: [phone] Please check AMION for all [place] numbers 11/20/2017 2:09 PM

## 2017-11-20 NOTE — Progress Notes (Signed)
ANTICOAGULATION CONSULT NOTE - Initial Consult  Pharmacy Consult for lovenox for one dose Indication: DVT  Allergies  Allergen Reactions  . Levofloxacin Itching and Rash    Patient Measurements: Height: 4\' 11"  (149.9 cm) Weight: 116 lb (52.6 kg) IBW/kg (Calculated) : 43.2 Heparin Dosing Weight: 52 kg  Vital Signs: Temp: 99.3 F (37.4 C) (10/18 2029) Temp Source: Oral (10/18 2029) BP: 102/72 (10/19 0102) Pulse Rate: 62 (10/19 0102)  Labs: Recent Labs    11/19/17 0944 11/19/17 2059  HGB 11.8* 11.6*  HCT  --  36.6  PLT  --  165  CREATININE  --  2.03*    Estimated Creatinine Clearance: 24.6 mL/min (A) (by C-G formula based on SCr of 2.03 mg/dL (H)).   Medical History: Past Medical History:  Diagnosis Date  . Bacteremia due to Gram-negative bacteria 05/23/2011  . Blind right eye   . Chronic lower back pain   . Complication of anesthesia    "woke up during OR; I have an extremely high tolerance" (12/11/2011) 1 procedure was graft; the other procedures were procedures that are typically done with sedation.  . DDD (degenerative disc disease), cervical   . Depression   . Dysrhythmia    "tachycardia" (12/11/2011) new onset afib 10/15/14 EKG  . E coli bacteremia 06/18/2011  . ESRD (end stage renal disease) ([place]) 06/12/2011  . Fibromyalgia   . Gastroparesis   . Gastroparesis   . GERD (gastroesophageal reflux disease)   . Glaucoma    right eye  . Gout   . H/O carpal tunnel syndrome   . Headache(784.0)    "not often anymore" (12/11/2011)  . Herpes genitalia 1994  . History of blood transfusion    "more than a few times" (12/11/2011)  . History of stomach ulcers   . Hypotension   . Iron deficiency anemia   . New onset a-fib ([place])    10/15/14 EKG  . Osteopenia   . Seizures ([place]) 1994   "post transplant; only have had that one" (12/11/2011)  . Spinal stenosis in cervical region   . Stroke [facility])     left basal ganglia lacunar infarct; Right frontal lobe lacunar infarct.   . Stroke [facility]) ~ 1999; 2001   "briefly lost my vision; lost my right eye" (12/11/2011)    Medications:  See medication history  Assessment: 52 yo lady with RLE swelling to receive lovenox for 1 dose. Goal of Therapy:  Anti-Xa level 0.6-1 units/ml 4hrs after LMWH dose given Monitor platelets by anticoagulation protocol: Yes   Plan:  Lovenox 50 mg sq x 1 F/u dopplers for further therapy  Dorman Calderwood [place]et 11/20/2017,2:33 AM

## 2017-11-20 NOTE — Progress Notes (Addendum)
Pt states " I took my evening dose this morning at 4 am and I'm not going to to be able to take this mornings dose until 1 pm " Spoke with Hedy Camara, Pharmacist, ok to reschedule prograff med to 1300 today

## 2017-11-21 LAB — TACROLIMUS LEVEL: Tacrolimus (FK506) - LabCorp: 5.9 ng/mL (ref 2.0–20.0)

## 2017-11-24 DIAGNOSIS — I82411 Acute embolism and thrombosis of right femoral vein: Secondary | ICD-10-CM | POA: Diagnosis not present

## 2017-11-24 DIAGNOSIS — M542 Cervicalgia: Secondary | ICD-10-CM | POA: Diagnosis not present

## 2017-11-24 DIAGNOSIS — E7800 Pure hypercholesterolemia, unspecified: Secondary | ICD-10-CM | POA: Diagnosis not present

## 2017-11-24 DIAGNOSIS — E559 Vitamin D deficiency, unspecified: Secondary | ICD-10-CM | POA: Diagnosis not present

## 2017-11-24 DIAGNOSIS — Z7901 Long term (current) use of anticoagulants: Secondary | ICD-10-CM | POA: Diagnosis not present

## 2017-11-24 DIAGNOSIS — Z79899 Other long term (current) drug therapy: Secondary | ICD-10-CM | POA: Diagnosis not present

## 2017-11-25 LAB — CULTURE, BLOOD (ROUTINE X 2)
Culture: NO GROWTH
Culture: NO GROWTH
Special Requests: ADEQUATE

## 2017-12-01 DIAGNOSIS — I82411 Acute embolism and thrombosis of right femoral vein: Secondary | ICD-10-CM | POA: Diagnosis not present

## 2017-12-01 DIAGNOSIS — Z7901 Long term (current) use of anticoagulants: Secondary | ICD-10-CM | POA: Diagnosis not present

## 2017-12-03 ENCOUNTER — Ambulatory Visit (HOSPITAL_COMMUNITY)
Admission: RE | Admit: 2017-12-03 | Discharge: 2017-12-03 | Disposition: A | Discharge status: Home / Self Care | Payer: Medicaid Other | Source: Ambulatory Visit | Attending: Nephrology | Admitting: Nephrology

## 2017-12-03 DIAGNOSIS — D631 Anemia in chronic kidney disease: Secondary | ICD-10-CM | POA: Diagnosis not present

## 2017-12-03 DIAGNOSIS — N185 Chronic kidney disease, stage 5: Secondary | ICD-10-CM | POA: Insufficient documentation

## 2017-12-03 DIAGNOSIS — Z940 Kidney transplant status: Secondary | ICD-10-CM

## 2017-12-03 LAB — IRON AND TIBC
Iron: 62 ug/dL (ref 28–170)
Saturation Ratios: 29 % (ref 10.4–31.8)
TIBC: 211 ug/dL — ABNORMAL LOW (ref 250–450)
UIBC: 149 ug/dL

## 2017-12-03 LAB — POCT HEMOGLOBIN-HEMACUE: Hemoglobin: 11.9 g/dL — ABNORMAL LOW (ref 12.0–15.0)

## 2017-12-03 LAB — PHOSPHORUS: Phosphorus: 3.9 mg/dL (ref 2.5–4.6)

## 2017-12-03 LAB — FERRITIN: Ferritin: 1082 ng/mL — ABNORMAL HIGH (ref 11–307)

## 2017-12-03 MED ORDER — EPOETIN ALFA 20000 UNIT/ML IJ SOLN: 40000.0000 [IU] | INTRAMUSCULAR | Status: DC

## 2017-12-03 MED ORDER — EPOETIN ALFA 40000 UNIT/ML IJ SOLN
INTRAMUSCULAR | Status: AC
  Administered 2017-12-03: 40000 [IU] via SUBCUTANEOUS
  Filled 2017-12-03: qty 1

## 2017-12-12 IMAGING — DX DG CHEST 2V
2 series · 2 of 2 positions shown · non-contrast
Comparison: Chest radiograph June 17, 2015

CLINICAL DATA: LEFT chest pain and dyspnea beginning this morning.
Tachycardia. History of stent.

EXAM:
CHEST  2 VIEW

[chest pa]
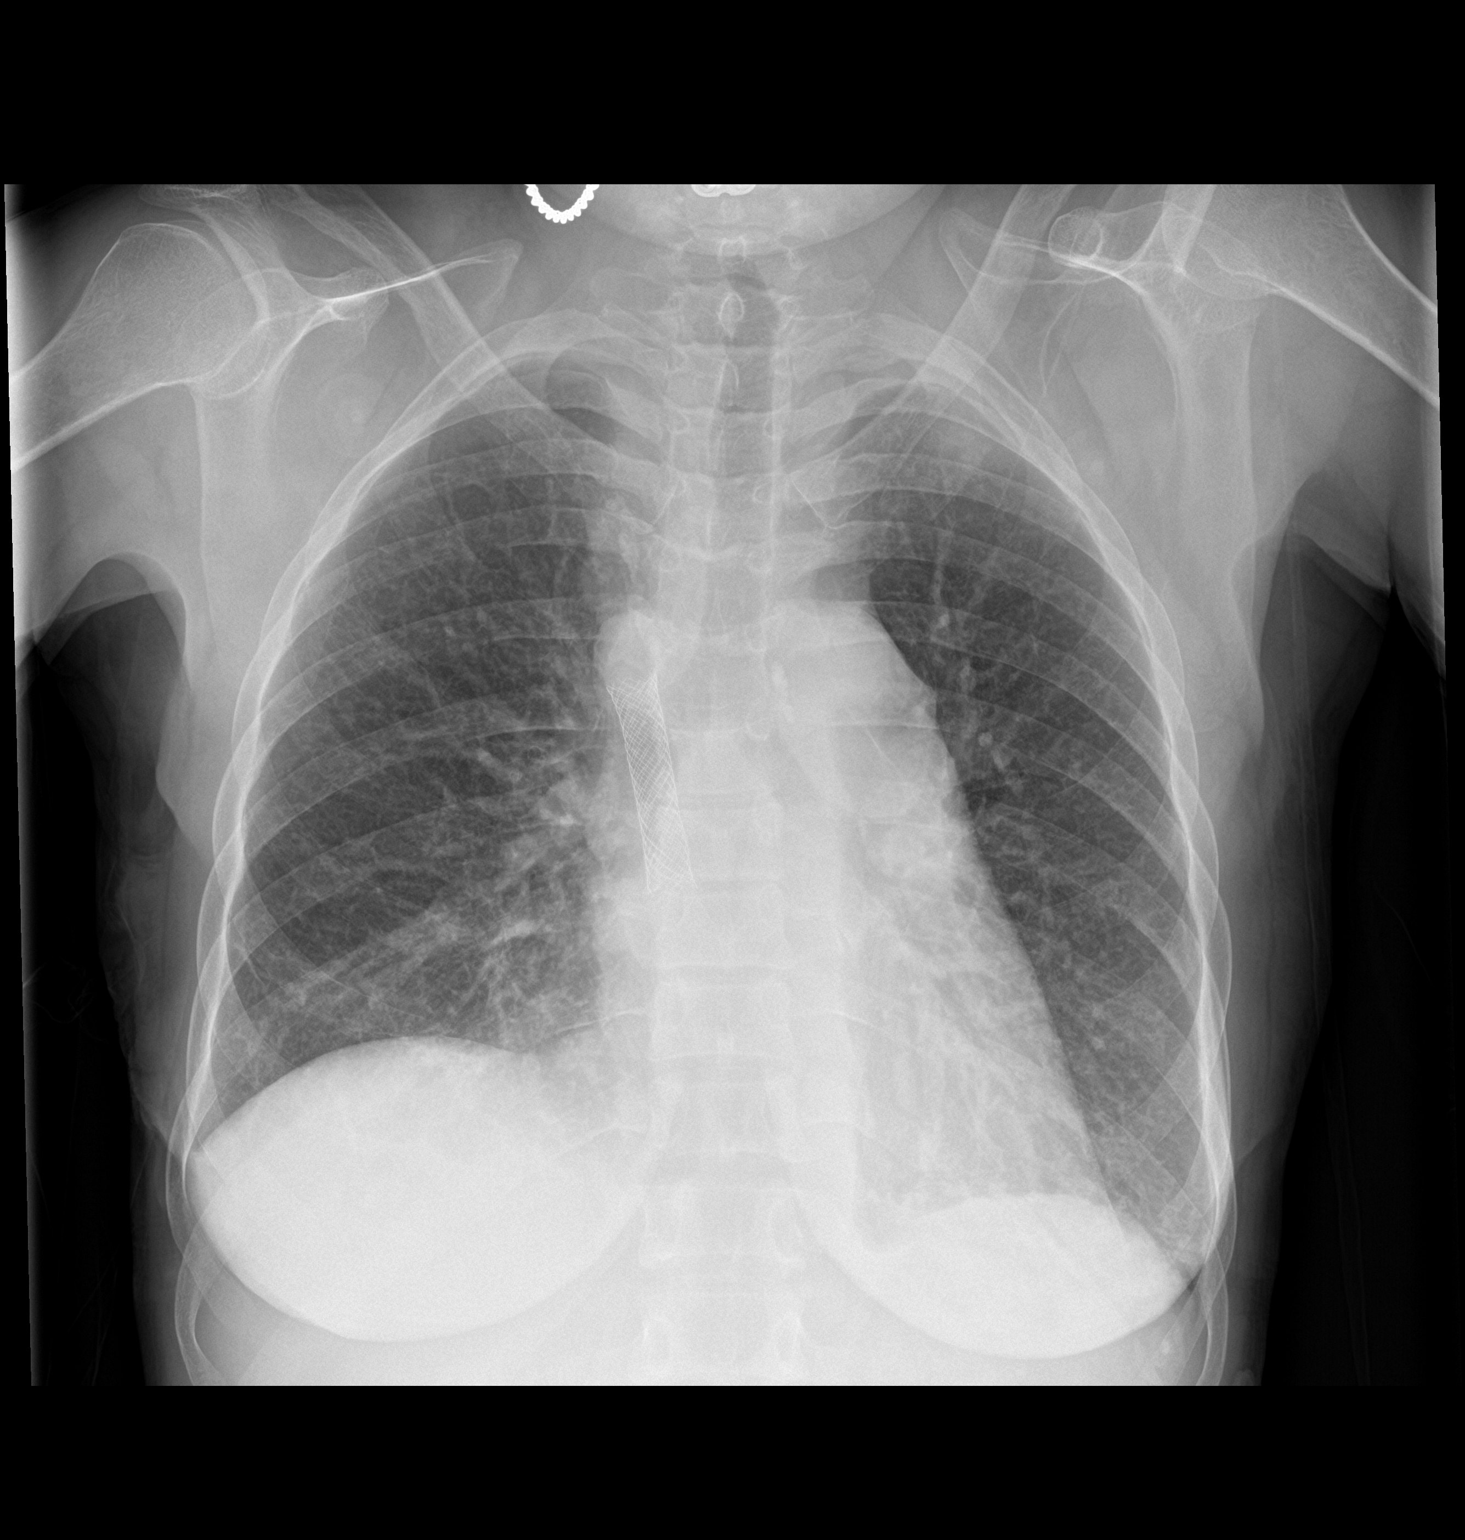

[chest lat]
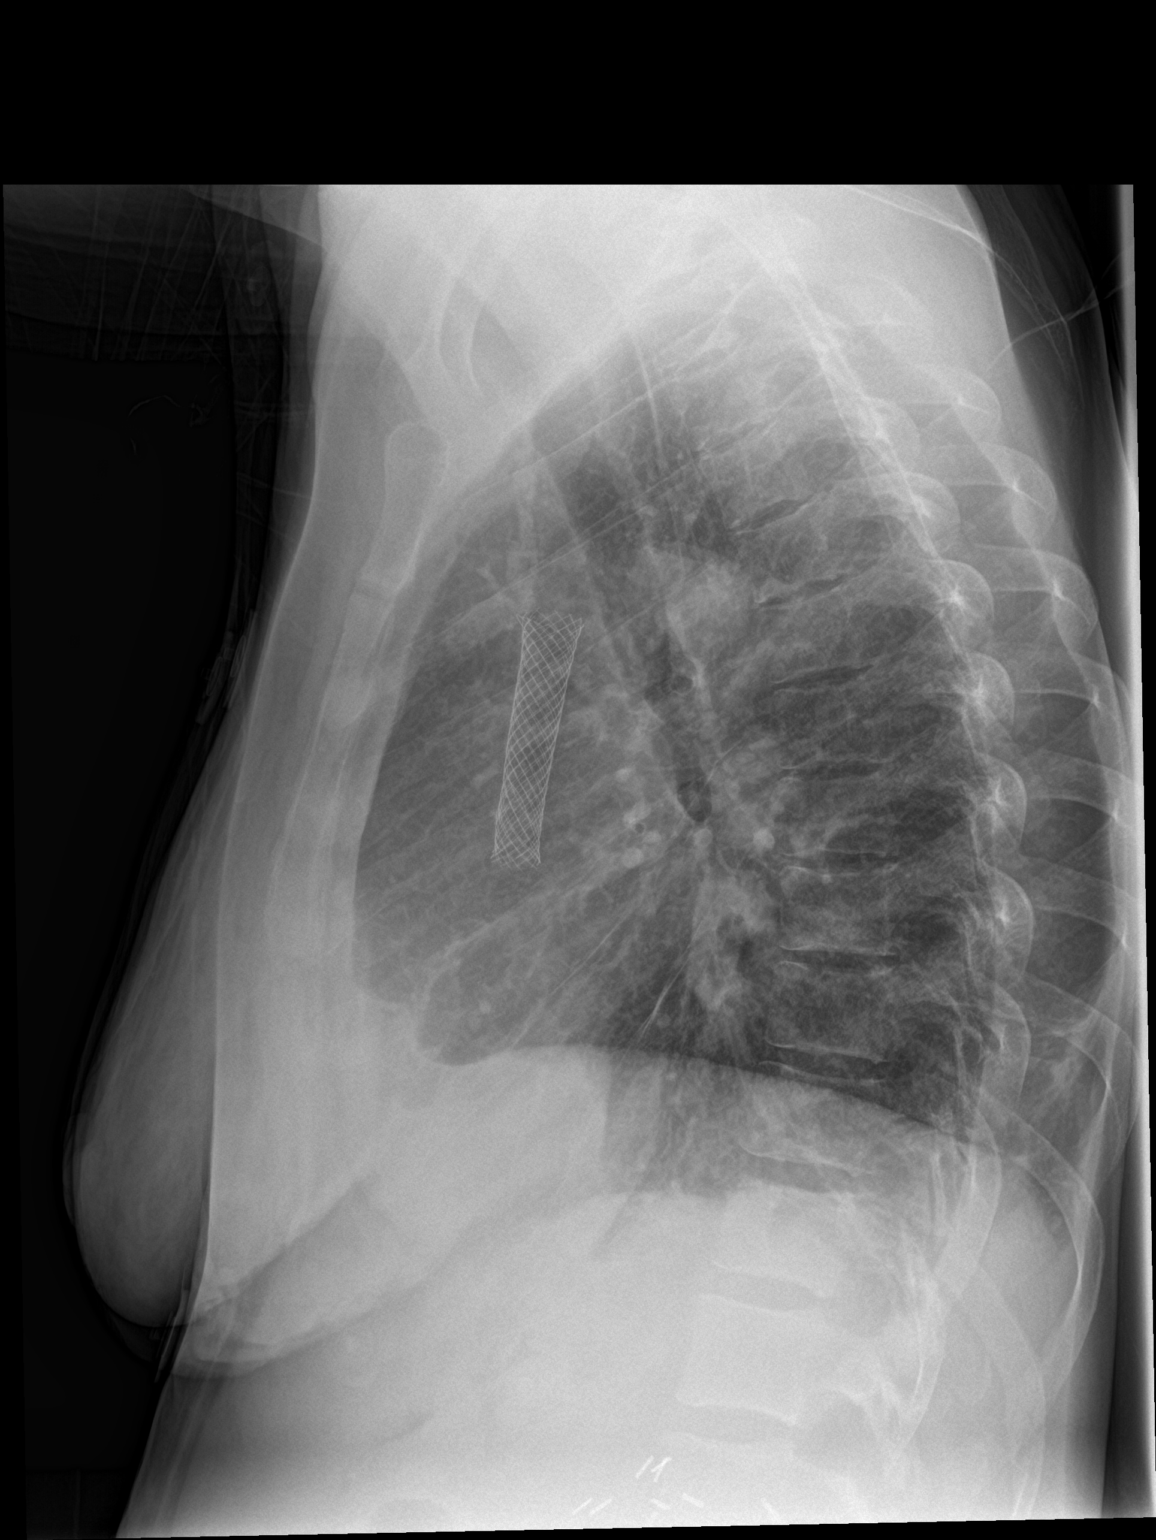

[2 of 2 positions shown; findings below may reference images not displayed]

FINDINGS: Cardiac silhouette is mildly enlarged and unchanged. Stent projects
in RIGHT mediastinum. Mild similar bronchitic changes in pulmonary
vascular congestion without pleural effusion or focal consolidation.
Mildly elevated RIGHT hemidiaphragm. No pneumothorax. ACDF. Surgical
clips in the abdomen.
IMPRESSION: Mild cardiomegaly. Similar bronchitic changes and mild pulmonary
vascular congestion.

## 2017-12-13 IMAGING — NM NM PULMONARY VENT & PERF
16 series · 16 of 16 positions shown · non-contrast
Comparison: Chest radiograph yesterday.

CLINICAL DATA: Left-sided pleuritic chest pain.

EXAM:
NUCLEAR MEDICINE VENTILATION - PERFUSION LUNG SCAN
TECHNIQUE: Ventilation images were obtained in multiple projections using
inhaled aerosol Xc-EEm DTPA. Perfusion images were obtained in
multiple projections after intravenous injection of Xc-EEm MAA.
RADIOPHARMACEUTICALS:  31.2 mCi Kechnetium-UUm DTPA aerosol
inhalation and 4.06 mCi Kechnetium-UUm MAA IV

[Series 1: ant/post vent · 4.14mm/px · 1 of 1 slices shown (1 of 2)]
[im 1/1  full-range]
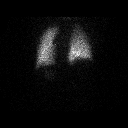

[Series 1: ant/post vent · 4.14mm/px · 1 of 1 slices shown (2 of 2)]
[im 1/1  full-range]
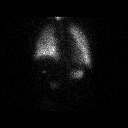

[Series 2: lao/rpo vent · 4.14mm/px · 1 of 1 slices shown (1 of 2)]
[im 1/1  full-range]
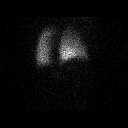

[Series 2: lao/rpo vent · 4.14mm/px · 1 of 1 slices shown (2 of 2)]
[im 1/1]
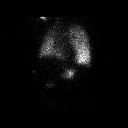

[Series 3: lpo/rao vent · 4.14mm/px · 1 of 1 slices shown (1 of 2)]
[im 1/1  full-range]
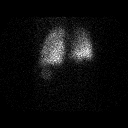

[Series 3: lpo/rao vent · 4.14mm/px · 1 of 1 slices shown (2 of 2)]
[im 1/1  full-range]
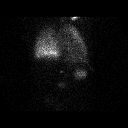

[Series 4: lt lat/rt lat vent · 4.14mm/px · 1 of 1 slices shown (1 of 2)]
[im 1/1  full-range]
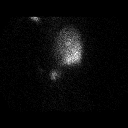

[Series 4: lt lat/rt lat vent · 4.14mm/px · 1 of 1 slices shown (2 of 2)]
[im 1/1  full-range]
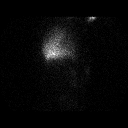

[Series 5: lt lat/rt lat perf · 4.14mm/px · 1 of 1 slices shown (1 of 2)]
[im 1/1]
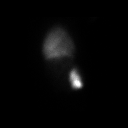

[Series 5: lt lat/rt lat perf · 4.14mm/px · 1 of 1 slices shown (2 of 2)]
[im 1/1]
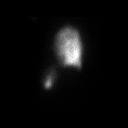

[Series 6: lpo/rao perf · 4.14mm/px · 1 of 1 slices shown (1 of 2)]
[im 1/1]
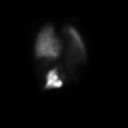

[Series 6: lpo/rao perf · 4.14mm/px · 1 of 1 slices shown (2 of 2)]
[im 1/1]
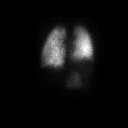

[Series 7: ant/post perf · 4.14mm/px · 1 of 1 slices shown (1 of 2)]
[im 1/1]
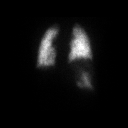

[Series 7: ant/post perf · 4.14mm/px · 1 of 1 slices shown (2 of 2)]
[im 1/1]
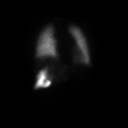

[Series 8: lao/rpo perf · 4.14mm/px · 1 of 1 slices shown (1 of 2)]
[im 1/1]
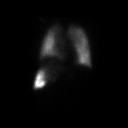

[Series 8: lao/rpo perf · 4.14mm/px · 1 of 1 slices shown (2 of 2)]
[im 1/1]
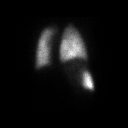

[16 of 16 positions shown; findings below may reference images not displayed]

FINDINGS: Ventilation: No focal ventilation defect.

Perfusion: No wedge shaped peripheral perfusion defects to suggest
acute pulmonary embolism.

Liver activity is seen on perfusion imaging likely due to trans
portal venous collaterals, multiple collaterals noted on CT
abdomen/pelvis 06/13/2014.
IMPRESSION: Low probability for pulmonary embolus.

## 2017-12-16 IMAGING — CR DG CHEST 2V
2 series · 2 of 2 positions shown · non-contrast
Comparison: 09/25/2015

CLINICAL DATA: Chest pain and shortness of Breath

EXAM:
CHEST  2 VIEW

[chest pa]
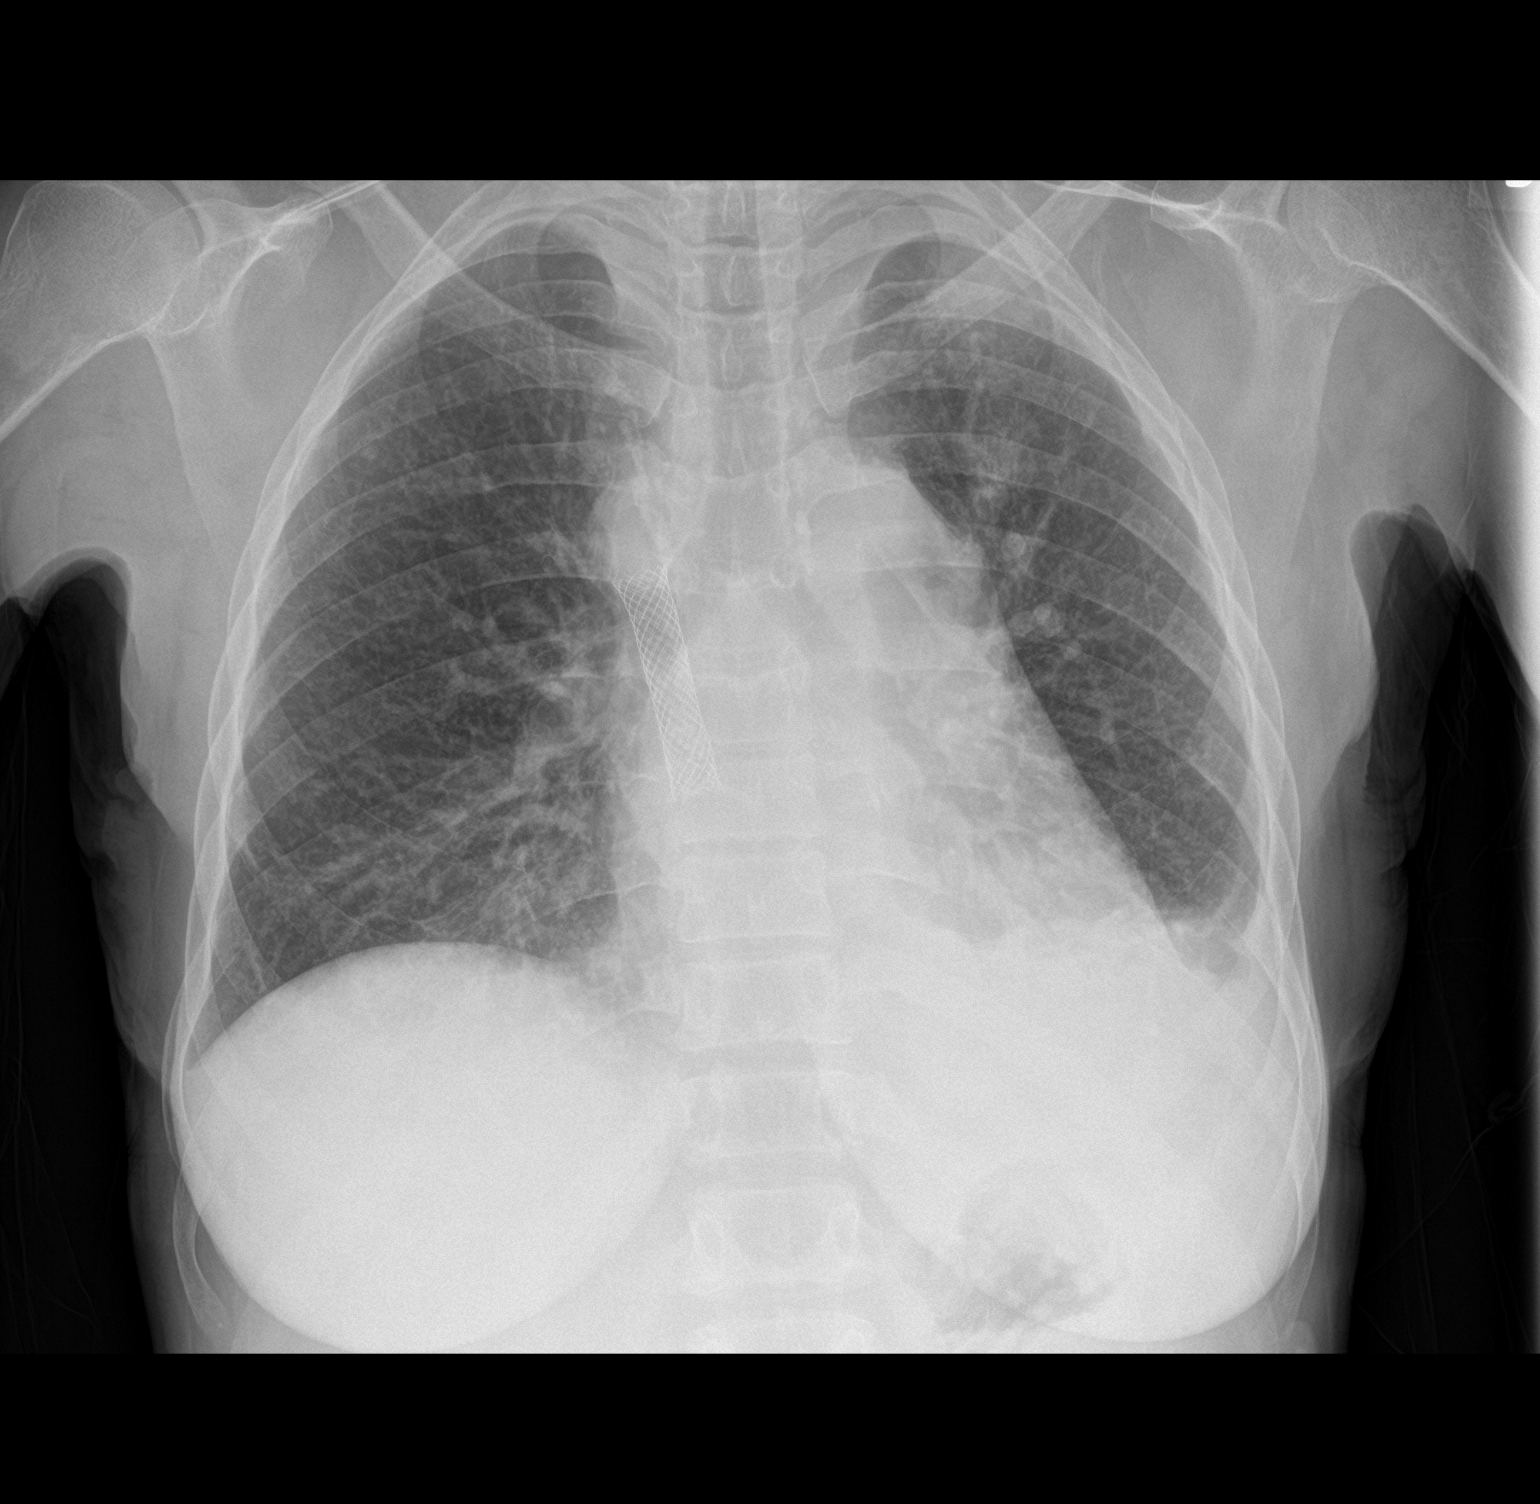

[chest lat]
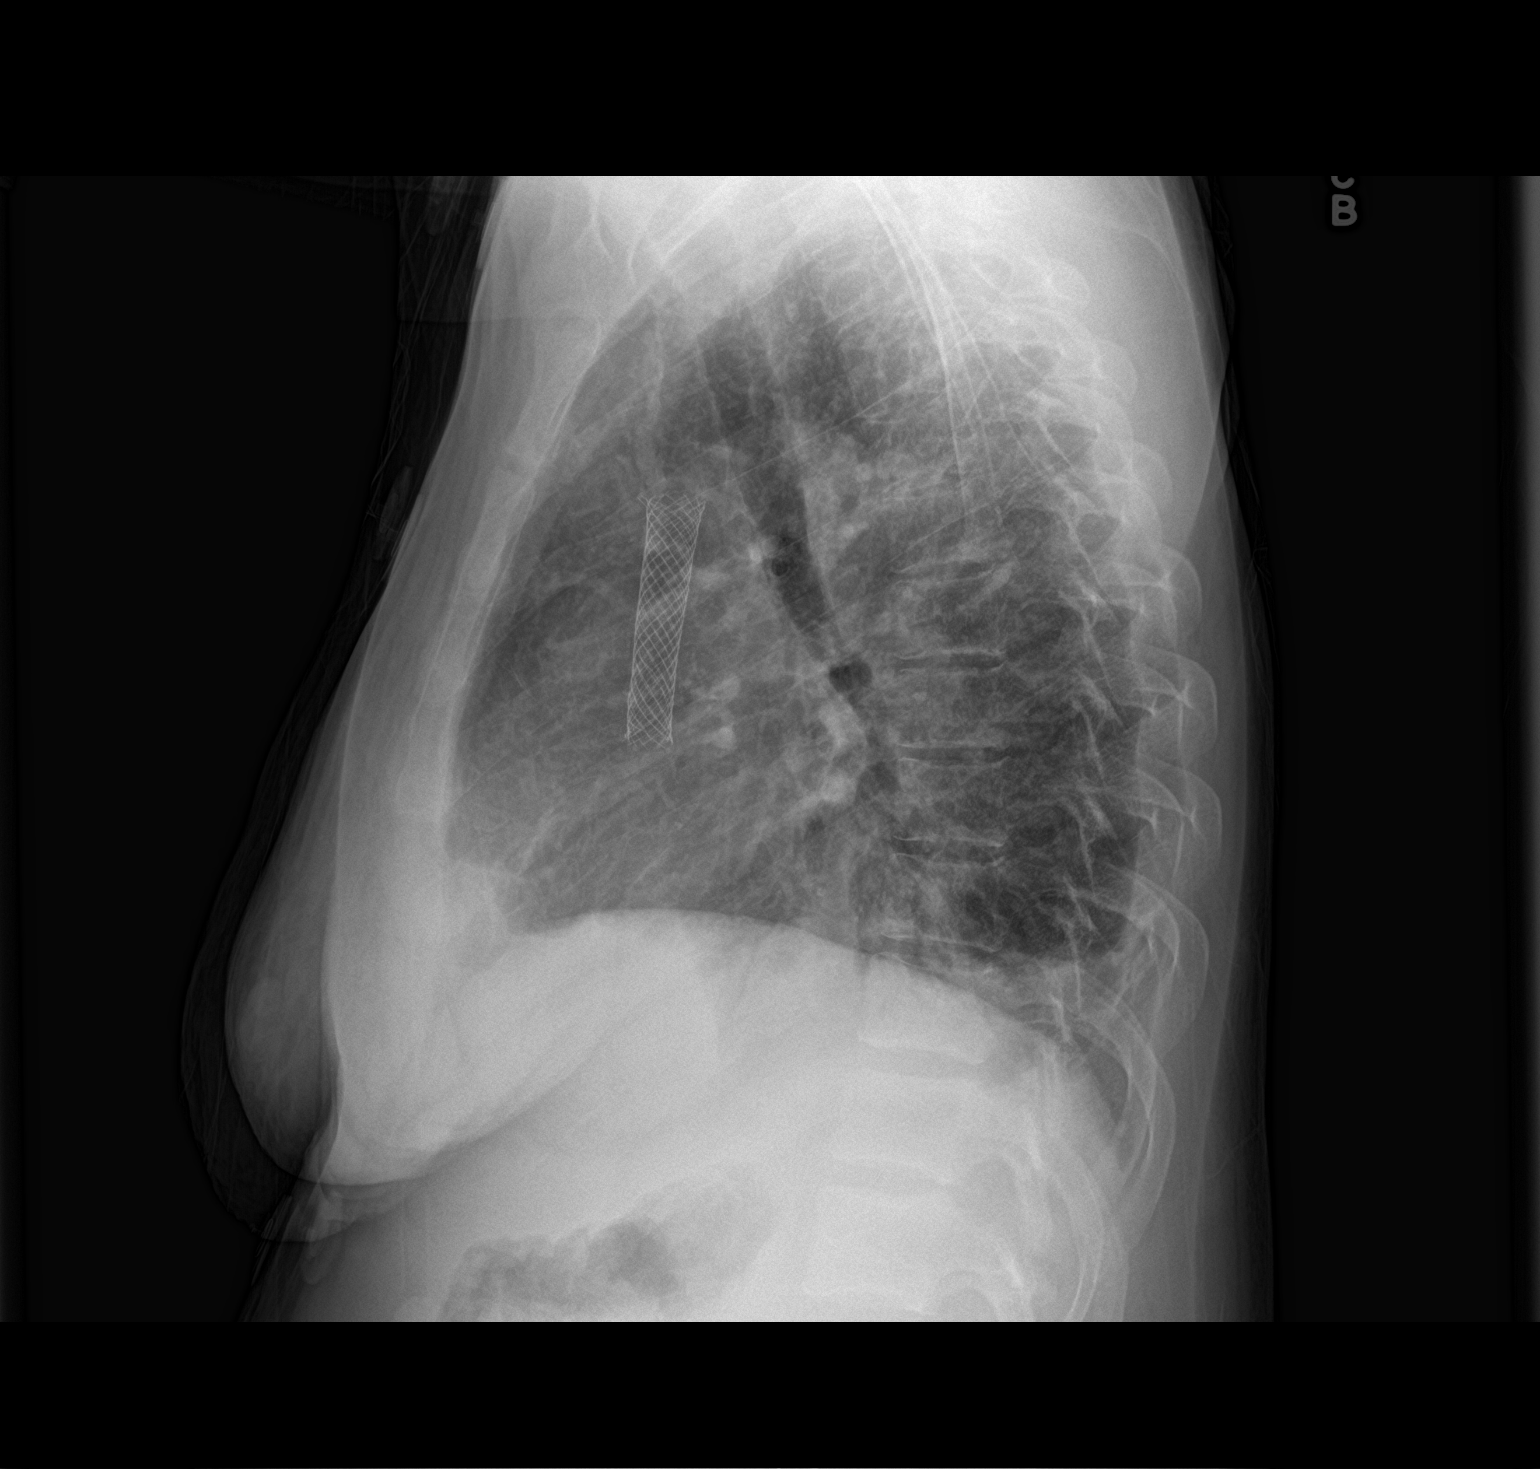

[2 of 2 positions shown; findings below may reference images not displayed]

FINDINGS: Moderate cardiac enlargement. There is a left pleural effusion which
appears increased from previous exam. Overlying atelectasis noted.
Pulmonary vascular congestion is noted. Right lung appears clear.
Stent is noted within the superior vena cava.
IMPRESSION: 1. Increase in volume of left pleural effusion.

## 2017-12-17 ENCOUNTER — Ambulatory Visit (HOSPITAL_COMMUNITY)
Admission: RE | Admit: 2017-12-17 | Discharge: 2017-12-17 | Disposition: A | Discharge status: Home / Self Care | Payer: Medicaid Other | Source: Ambulatory Visit | Attending: Nephrology | Admitting: Nephrology

## 2017-12-17 VITALS — BP 105/38 | HR 63 | Temp 98.2°F | Resp 20

## 2017-12-17 DIAGNOSIS — D631 Anemia in chronic kidney disease: Secondary | ICD-10-CM

## 2017-12-17 DIAGNOSIS — N185 Chronic kidney disease, stage 5: Secondary | ICD-10-CM | POA: Diagnosis not present

## 2017-12-17 DIAGNOSIS — Z940 Kidney transplant status: Secondary | ICD-10-CM | POA: Diagnosis not present

## 2017-12-17 LAB — POCT HEMOGLOBIN-HEMACUE: Hemoglobin: 11 g/dL — ABNORMAL LOW (ref 12.0–15.0)

## 2017-12-17 IMAGING — CT CT CHEST W/O CM
2 of 4 series · 14 of 36 positions shown, 17 images · non-contrast
Comparison: CT chest from [DATE] [DATE]

CLINICAL DATA: Chest pain and difficulty breathing for 5 days.

EXAM:
CT CHEST WITHOUT CONTRAST
TECHNIQUE: Multidetector CT imaging of the chest was performed following the
standard protocol without IV contrast.

[Series 201: chest without, idose (2) · axial · non-contrast · 0.55mm/px · z∈[-144,+76]mm · 11 of 106 slices shown, 14 images]
[im 9/106  mediastinal]
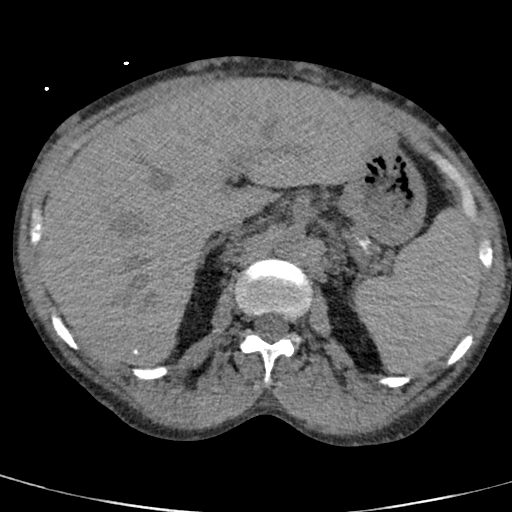
[im 9/106  lung]
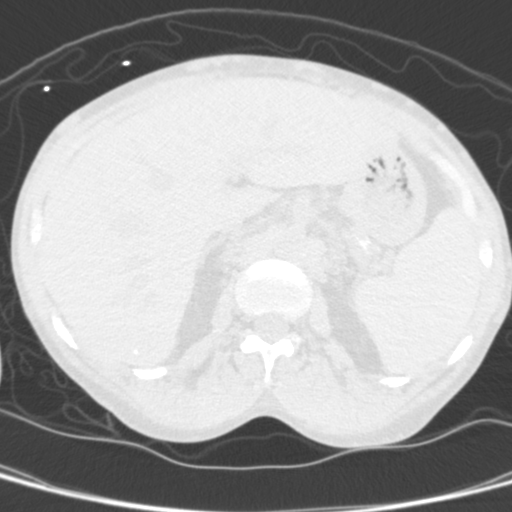
[im 17/106  lung]
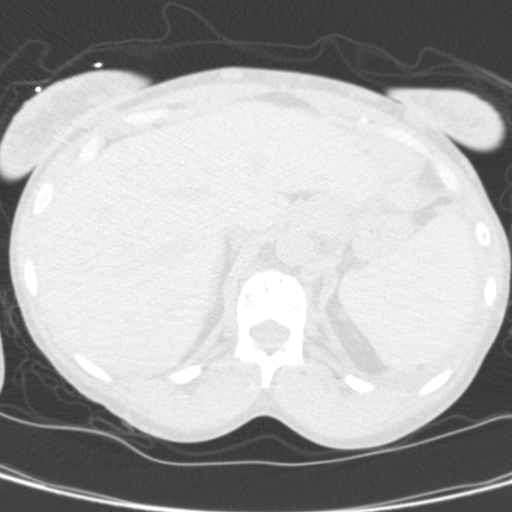
[im 25/106  lung]
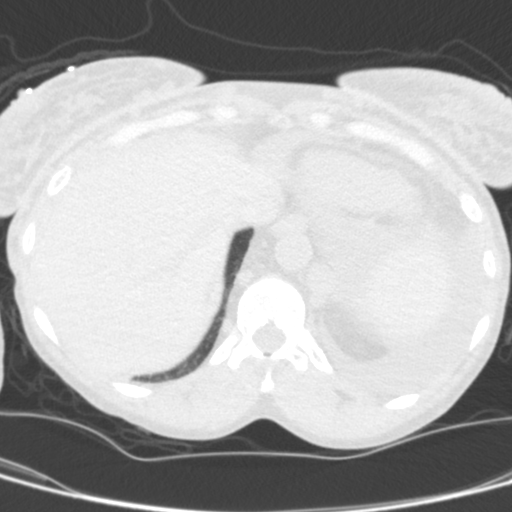
[im 33/106  lung]
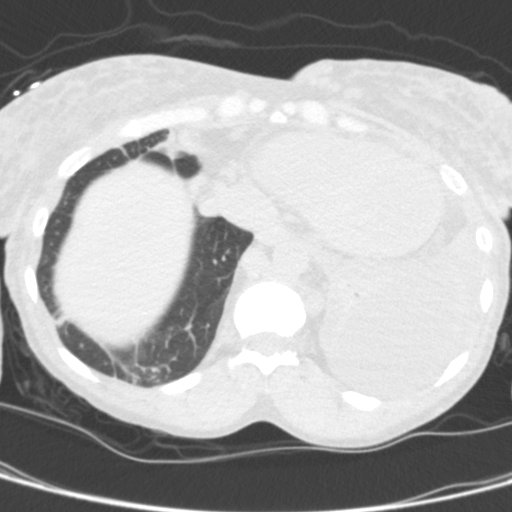
[im 41/106  mediastinal]
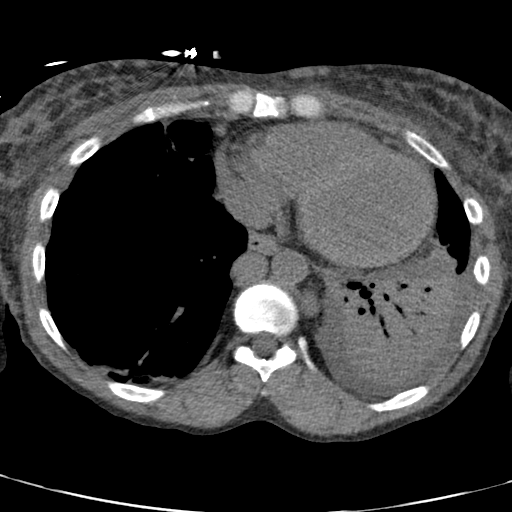
[im 41/106  lung]
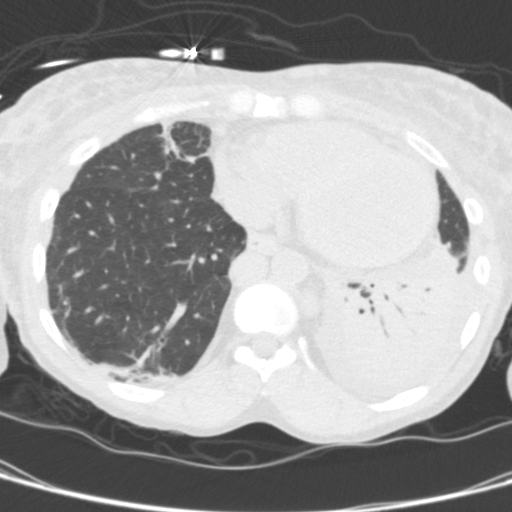
[im 57/106  lung]
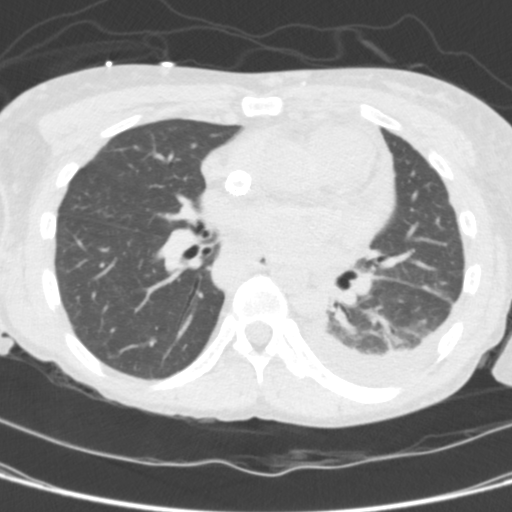
[im 65/106  lung]
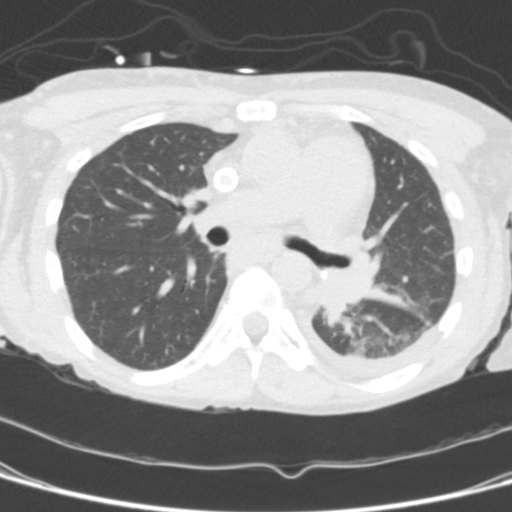
[im 73/106  lung]
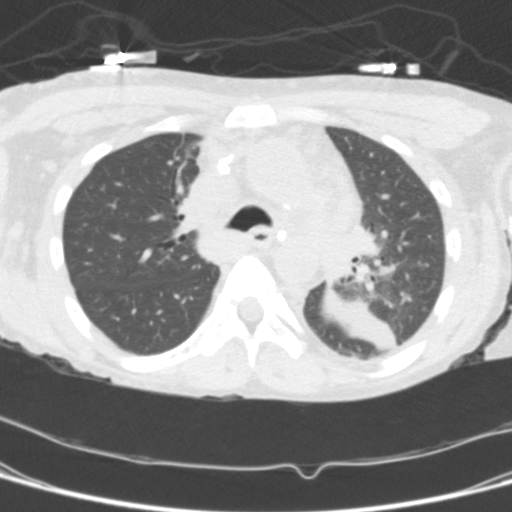
[im 81/106  mediastinal]
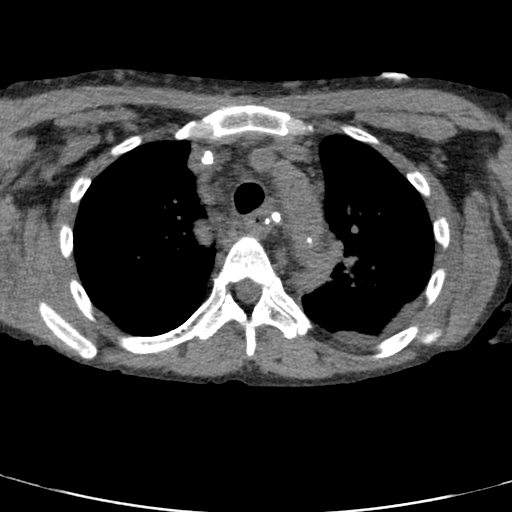
[im 81/106  lung]
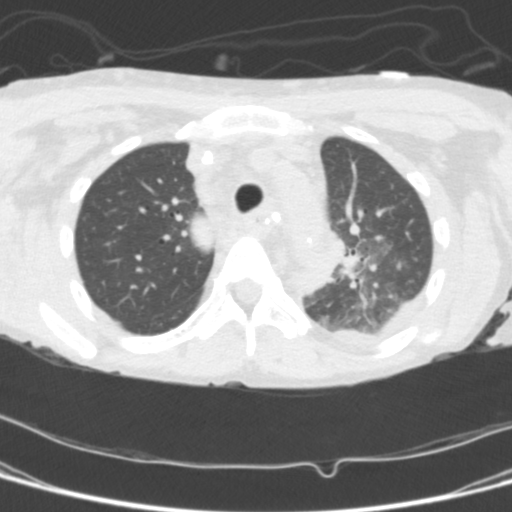
[im 89/106  lung]
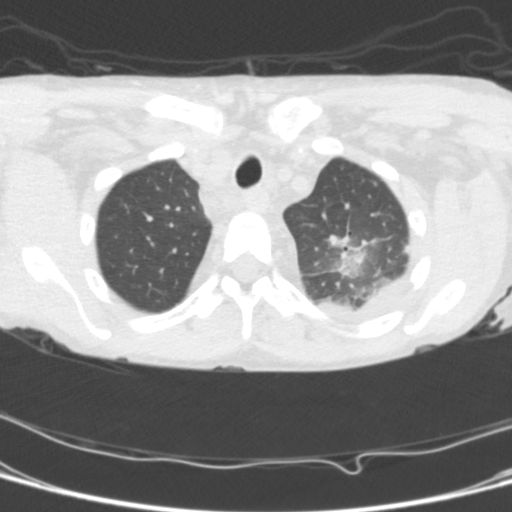
[im 97/106  lung]
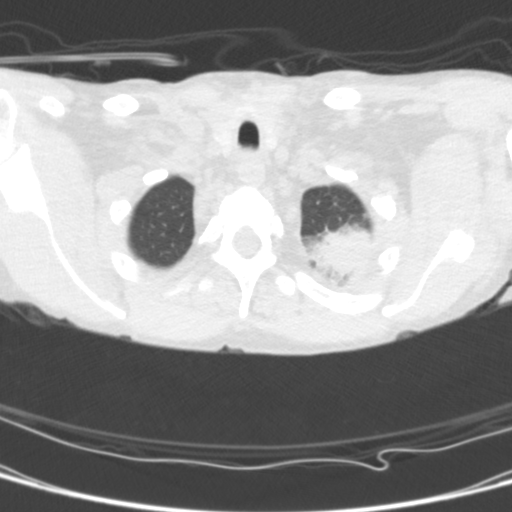

[Series 203: coronal, idose (2) · coronal · 0.45mm/px · 3 of 99 slices shown]
[im 20/99  lung]
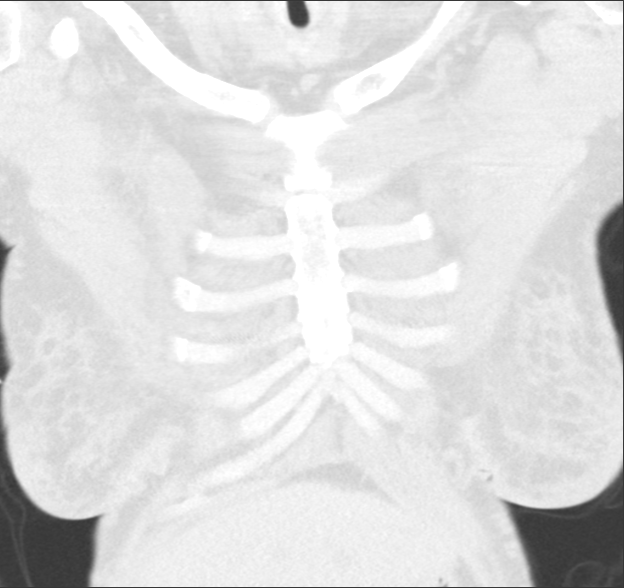
[im 40/99  lung]
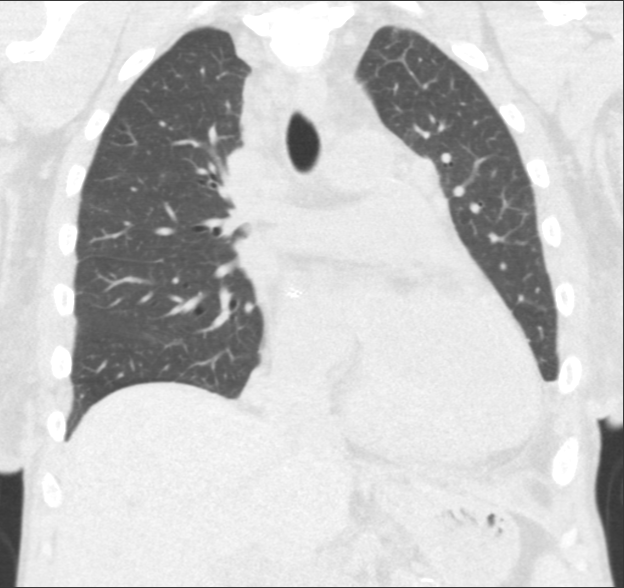
[im 59/99  lung]
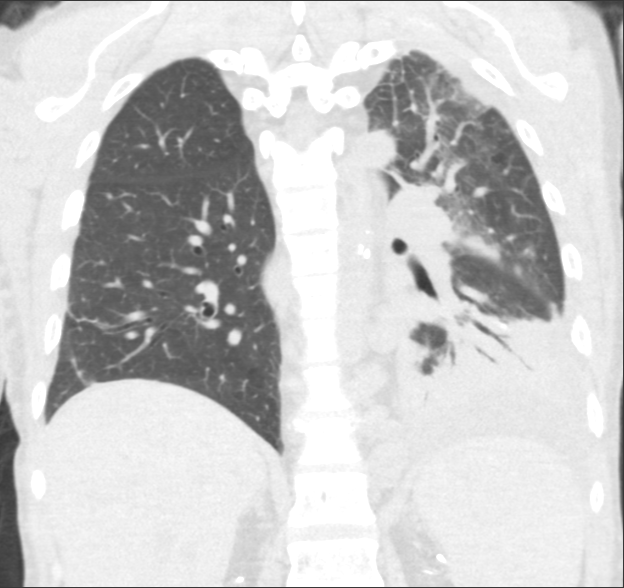

[14 of 36 positions shown; findings below may reference images not displayed]

FINDINGS: Cardiovascular: There is mild cardiac enlargement. Aortic
atherosclerosis noted. Calcification within the RCA coronary artery
noted. There is a metallic stent identified within the superior vena
cava. Increase in size of the azygos and hemi azygous veins with
multiple posterior mediastinal collateral vessels. There is also
extensive chest wall collateral vessels.

Mediastinum/Nodes: The trachea appears patent and is midline. Normal
appearance of the esophagus. Multiple calcified and noncalcified
lymph nodes are identified. Within the limitations of noncontrast
technique no definite evidence for mediastinal or hilar adenopathy.

Lungs/Pleura: There is a loculated pleural effusion overlying the
left hemidiaphragm which measures approximately 6.9 x 3.8 cm.
Overlying airspace consolidation is identified. Masslike
consolidation within the left upper lobe measures 4.2 cm, image
number 12 of series 205. Subsegmental atelectasis is noted in the
posterior right base and right middle lobe.

Upper Abdomen: The visualized portions of the liver and spleen are
unremarkable. Surgical clips are identified within the
retroperitoneum of the upper abdomen.

Musculoskeletal: No aggressive lytic or sclerotic bone lesions
identified.
IMPRESSION: 1. There is a loculated pleural effusion identified in the left lung
base with overlying airspace consolidation in atelectasis. Cannot
rule [DATE]. Masslike consolidation in the left upper lobe is noted. In the
acute setting this is favored to represent pneumonia. This will need
follow-up imaging to ensure resolution. If this does not resolve
then further investigation with PET-CT and tissue sampling would be
advised.
3. There is a metallic stent within the SVC. Extensive mediastinal
and chest wall collaterals are identified which would be compatible
with a chronic obstruction of the SVC.
4. Prior granulomatous disease
5. Aortic atherosclerosis and  coronary artery calcifications.

## 2017-12-17 MED ORDER — EPOETIN ALFA 40000 UNIT/ML IJ SOLN
INTRAMUSCULAR | Status: AC
  Administered 2017-12-17: 40000 [IU] via SUBCUTANEOUS
  Filled 2017-12-17: qty 1

## 2017-12-17 MED ORDER — EPOETIN ALFA 20000 UNIT/ML IJ SOLN: 40000.0000 [IU] | INTRAMUSCULAR | Status: DC

## 2017-12-24 DIAGNOSIS — I82411 Acute embolism and thrombosis of right femoral vein: Secondary | ICD-10-CM | POA: Diagnosis not present

## 2017-12-24 DIAGNOSIS — Z79899 Other long term (current) drug therapy: Secondary | ICD-10-CM | POA: Diagnosis not present

## 2017-12-24 DIAGNOSIS — E559 Vitamin D deficiency, unspecified: Secondary | ICD-10-CM | POA: Diagnosis not present

## 2017-12-24 DIAGNOSIS — E782 Mixed hyperlipidemia: Secondary | ICD-10-CM | POA: Diagnosis not present

## 2017-12-24 DIAGNOSIS — Z7901 Long term (current) use of anticoagulants: Secondary | ICD-10-CM | POA: Diagnosis not present

## 2017-12-31 ENCOUNTER — Encounter (HOSPITAL_COMMUNITY): Payer: Medicaid Other

## 2018-01-03 ENCOUNTER — Ambulatory Visit (HOSPITAL_COMMUNITY)
Admission: RE | Admit: 2018-01-03 | Discharge: 2018-01-03 | Disposition: A | Discharge status: Home / Self Care | Payer: Medicaid Other | Source: Ambulatory Visit | Attending: Nephrology | Admitting: Nephrology

## 2018-01-03 VITALS — BP 108/53 | HR 83 | Temp 98.3°F | Resp 20

## 2018-01-03 DIAGNOSIS — Z940 Kidney transplant status: Secondary | ICD-10-CM

## 2018-01-03 DIAGNOSIS — N185 Chronic kidney disease, stage 5: Secondary | ICD-10-CM

## 2018-01-03 DIAGNOSIS — D631 Anemia in chronic kidney disease: Secondary | ICD-10-CM | POA: Diagnosis not present

## 2018-01-03 DIAGNOSIS — N189 Chronic kidney disease, unspecified: Secondary | ICD-10-CM | POA: Diagnosis not present

## 2018-01-03 LAB — COMPREHENSIVE METABOLIC PANEL
ALT: 13 U/L (ref 0–44)
AST: 15 U/L (ref 15–41)
Albumin: 4.1 g/dL (ref 3.5–5.0)
Alkaline Phosphatase: 109 U/L (ref 38–126)
Anion gap: 7 (ref 5–15)
BUN: 35 mg/dL — ABNORMAL HIGH (ref 6–20)
CO2: 25 mmol/L (ref 22–32)
Calcium: 9.3 mg/dL (ref 8.9–10.3)
Chloride: 106 mmol/L (ref 98–111)
Creatinine, Ser: 2.45 mg/dL — ABNORMAL HIGH (ref 0.44–1.00)
GFR calc Af Amer: 26 mL/min — ABNORMAL LOW (ref >60)
GFR calc non Af Amer: 22 mL/min — ABNORMAL LOW (ref >60)
Glucose, Bld: 94 mg/dL (ref 70–99)
Potassium: 4.5 mmol/L (ref 3.5–5.1)
Sodium: 138 mmol/L (ref 135–145)
Total Bilirubin: 0.4 mg/dL (ref 0.3–1.2)
Total Protein: 6.7 g/dL (ref 6.5–8.1)

## 2018-01-03 LAB — POCT HEMOGLOBIN-HEMACUE: Hemoglobin: 12.2 g/dL (ref 12.0–15.0)

## 2018-01-03 MED ORDER — EPOETIN ALFA 20000 UNIT/ML IJ SOLN: 40000.0000 [IU] | INTRAMUSCULAR | Status: DC

## 2018-01-10 IMAGING — CT CT CHEST W/O CM
2 of 3 series · 15 of 36 positions shown, 18 images · non-contrast
Comparison: 09/30/2015

CLINICAL DATA: Pt returned after waiting 7+ hours last night,
continues to complain of weakness-- states "had a gastroparesis
attack and had been vomiting" Chest pain also today Pt is a kidney
transplant pt

EXAM:
CT CHEST WITHOUT CONTRAST
TECHNIQUE: Multidetector CT imaging of the chest was performed following the
standard protocol without IV contrast.

[Series 2: chest w/o 2mm st · axial · non-contrast · 0.59mm/px · z∈[+1184,+1470]mm · 12 of 169 slices shown, 15 images]
[im 13/169  mediastinal]
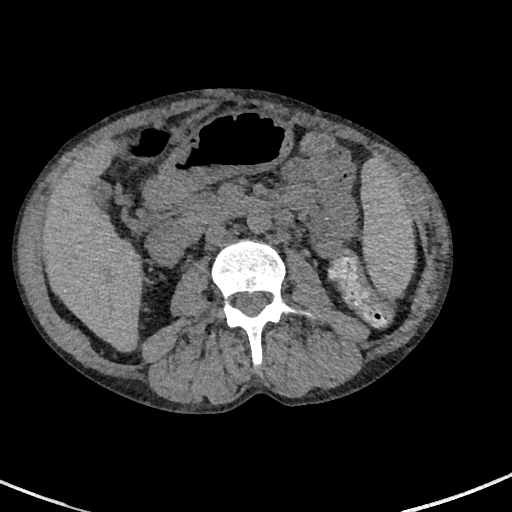
[im 13/169  lung]
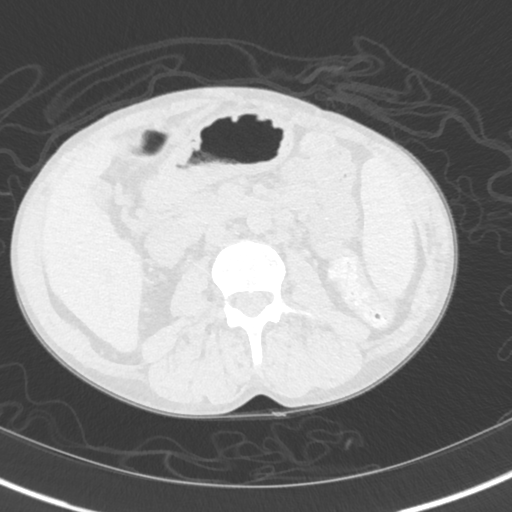
[im 25/169  lung]
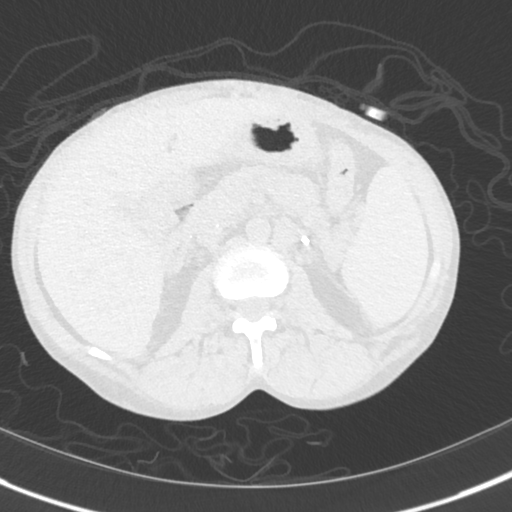
[im 38/169  lung]
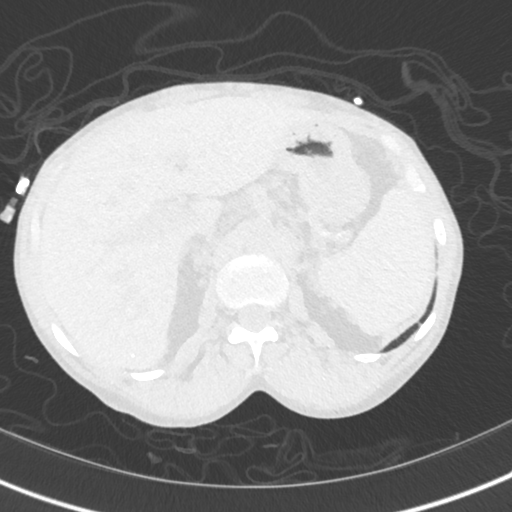
[im 50/169  lung]
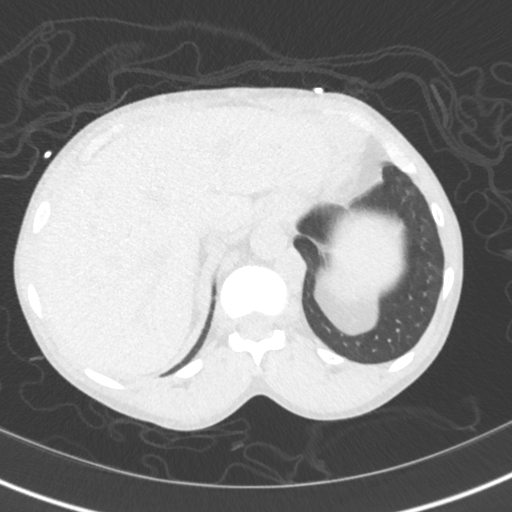
[im 63/169  mediastinal]
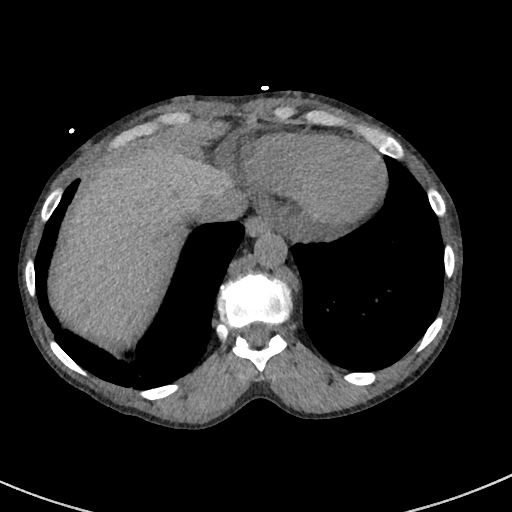
[im 63/169  lung]
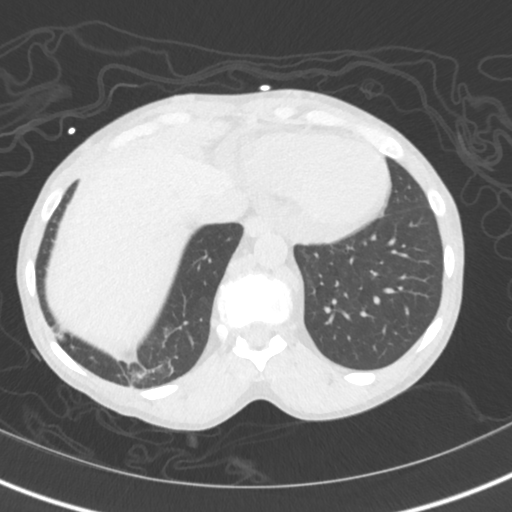
[im 75/169  lung]
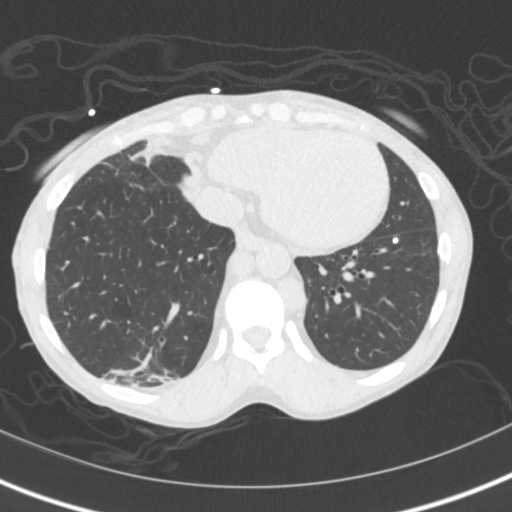
[im 94/169  lung]
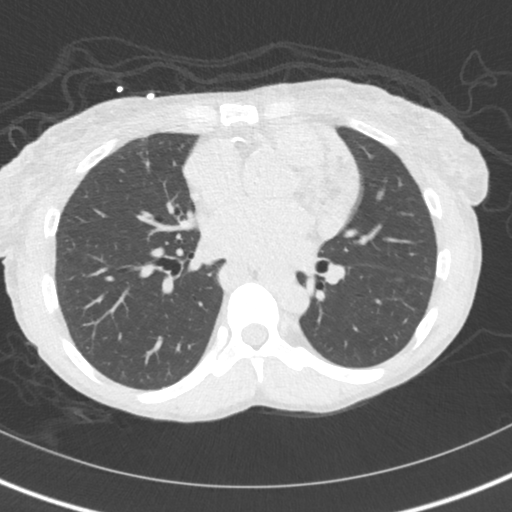
[im 106/169  lung]
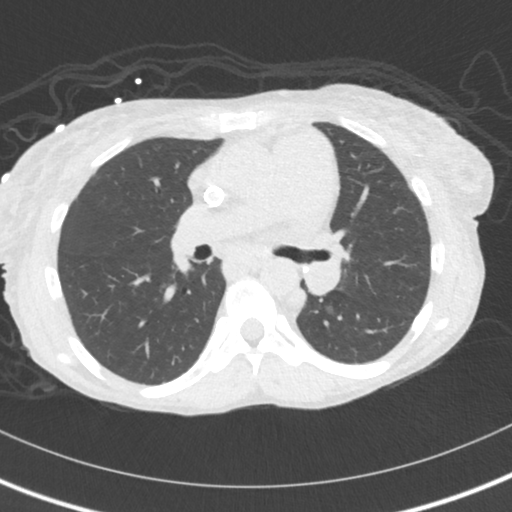
[im 119/169  mediastinal]
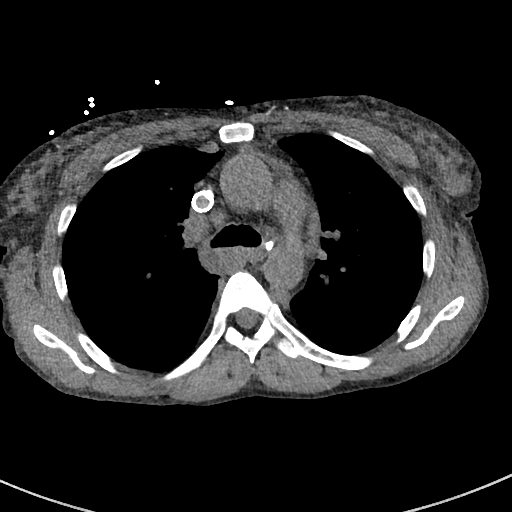
[im 119/169  lung]
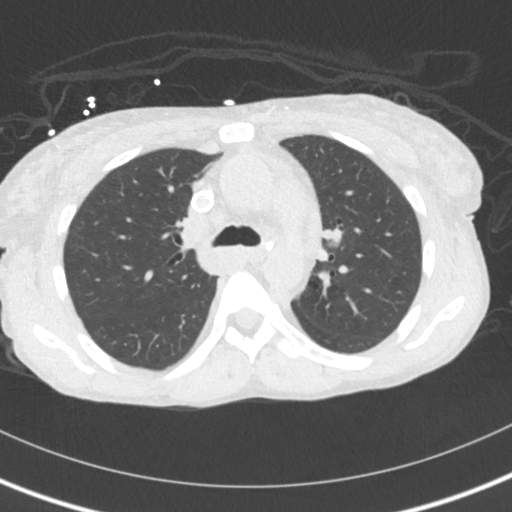
[im 131/169  lung]
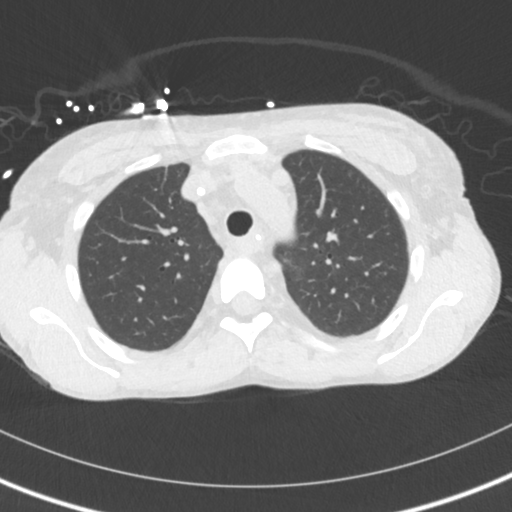
[im 144/169  lung]
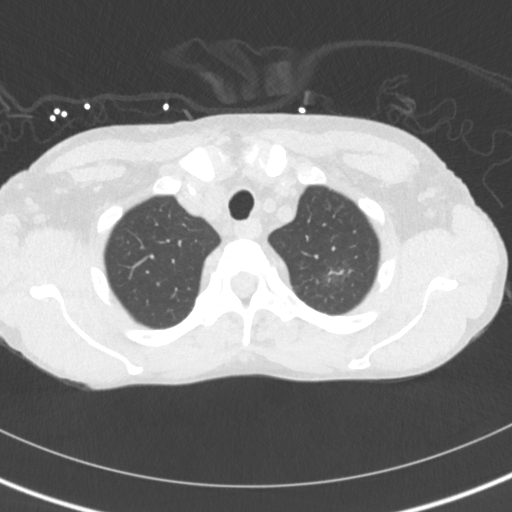
[im 156/169  lung]
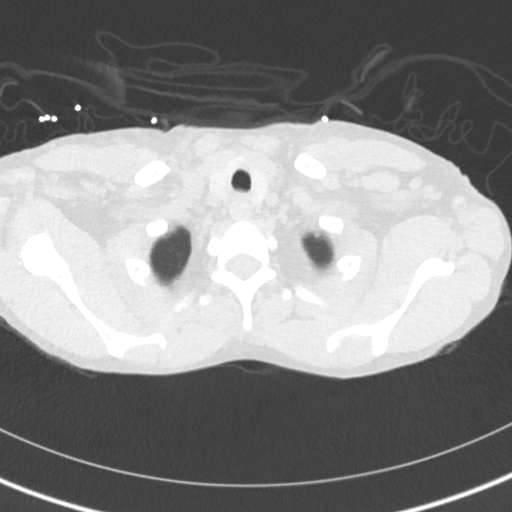

[Series 4: chest w/o 3mm st cor · coronal · non-contrast · 0.56mm/px · 3 of 69 slices shown]
[im 14/69  lung]
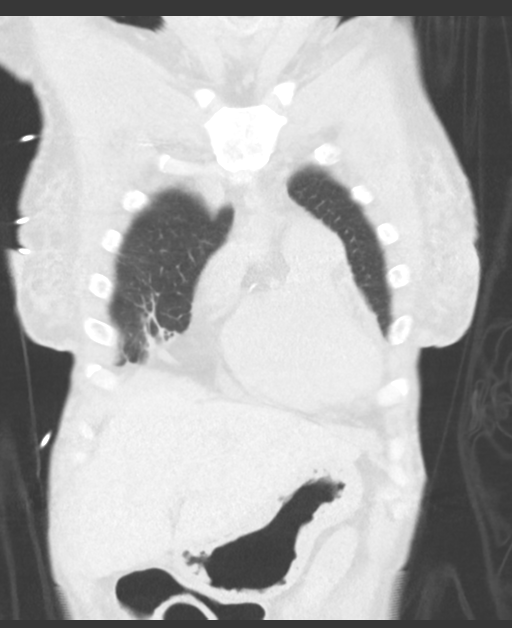
[im 28/69  lung]
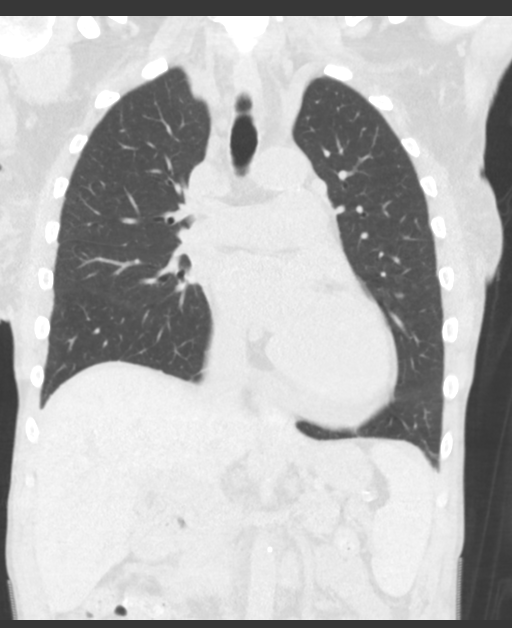
[im 41/69  lung]
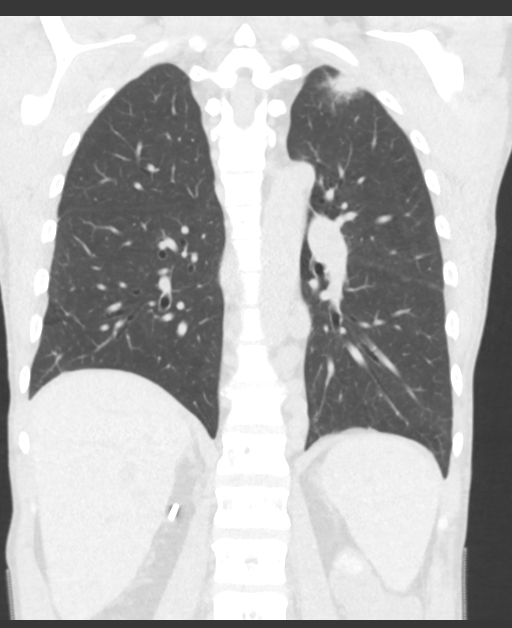

[15 of 36 positions shown; findings below may reference images not displayed]

FINDINGS: Cardiovascular: Trace pericardial effusion is noted. Atherosclerotic
calcification of the carotid arteries. Superior vena cava stent is
again noted. Prominent azygos vein again noted.

Mediastinum/Nodes: There is atherosclerotic calcification of the
thoracic aorta. Calcified small mediastinal lymph nodes are present.

Lungs/Pleura: Minimal scarring identified at the right lung base.
Within the left lung apex there is a spiculated mass or area of
consolidation. Density now measures cm 2.0 x 1.1, previously
measuring 4.2 cm maximum. Findings favor resolving infectious
process. Left pleural effusion has resolved.

Upper Abdomen: Normal noncontrast appearance of the visualized
portion of the liver. Visualized portion of the stomach is
unremarkable. Surgical clips are noted in the upper abdomen.
Gallbladder is present.

Musculoskeletal: Visualized osseous structures have a normal
appearance.
IMPRESSION: 1. Significant improvement in left upper lobe opacity, favoring
infectious process. Persistent spiculated mass is 2.0 cm. Continued
follow-up is recommended to document complete resolution. Consider
follow-up CT exam in 3-6 months.
2. Superior vena cava stent said numerous collateral vessels
favoring remote or chronic superior vena cava occlusion.
3. Interval resolution of left pleural effusion.
4. Visualized portion of the stomach is normal in appearance.
5. Trace pericardial effusion.

## 2018-01-12 ENCOUNTER — Emergency Department (HOSPITAL_COMMUNITY): Payer: Medicaid Other

## 2018-01-12 ENCOUNTER — Emergency Department (HOSPITAL_COMMUNITY)
Admission: EM | Admit: 2018-01-12 | Discharge: 2018-01-12 | Disposition: A | Discharge status: Home / Self Care | Payer: Medicaid Other | Attending: Emergency Medicine | Admitting: Emergency Medicine

## 2018-01-12 ENCOUNTER — Encounter (HOSPITAL_COMMUNITY): Payer: Self-pay | Admitting: Emergency Medicine

## 2018-01-12 ENCOUNTER — Other Ambulatory Visit: Payer: Self-pay

## 2018-01-12 DIAGNOSIS — R1084 Generalized abdominal pain: Secondary | ICD-10-CM | POA: Insufficient documentation

## 2018-01-12 DIAGNOSIS — R112 Nausea with vomiting, unspecified: Secondary | ICD-10-CM | POA: Diagnosis not present

## 2018-01-12 DIAGNOSIS — Z7982 Long term (current) use of aspirin: Secondary | ICD-10-CM | POA: Insufficient documentation

## 2018-01-12 DIAGNOSIS — F17210 Nicotine dependence, cigarettes, uncomplicated: Secondary | ICD-10-CM | POA: Diagnosis not present

## 2018-01-12 DIAGNOSIS — R05 Cough: Secondary | ICD-10-CM | POA: Diagnosis not present

## 2018-01-12 DIAGNOSIS — Z79899 Other long term (current) drug therapy: Secondary | ICD-10-CM | POA: Insufficient documentation

## 2018-01-12 DIAGNOSIS — N183 Chronic kidney disease, stage 3 (moderate): Secondary | ICD-10-CM | POA: Diagnosis not present

## 2018-01-12 DIAGNOSIS — R630 Anorexia: Secondary | ICD-10-CM | POA: Diagnosis not present

## 2018-01-12 LAB — LIPASE, BLOOD: Lipase: 35 U/L (ref 11–51)

## 2018-01-12 LAB — COMPREHENSIVE METABOLIC PANEL
ALT: 9 U/L (ref 0–44)
AST: 18 U/L (ref 15–41)
Albumin: 3.9 g/dL (ref 3.5–5.0)
Alkaline Phosphatase: 107 U/L (ref 38–126)
Anion gap: 12 (ref 5–15)
BUN: 25 mg/dL — ABNORMAL HIGH (ref 6–20)
CO2: 21 mmol/L — ABNORMAL LOW (ref 22–32)
Calcium: 9.4 mg/dL (ref 8.9–10.3)
Chloride: 105 mmol/L (ref 98–111)
Creatinine, Ser: 1.97 mg/dL — ABNORMAL HIGH (ref 0.44–1.00)
GFR calc Af Amer: 34 mL/min — ABNORMAL LOW (ref >60)
GFR calc non Af Amer: 29 mL/min — ABNORMAL LOW (ref >60)
Glucose, Bld: 94 mg/dL (ref 70–99)
Potassium: 4.2 mmol/L (ref 3.5–5.1)
Sodium: 138 mmol/L (ref 135–145)
Total Bilirubin: 0.9 mg/dL (ref 0.3–1.2)
Total Protein: 6.7 g/dL (ref 6.5–8.1)

## 2018-01-12 LAB — URINALYSIS, ROUTINE W REFLEX MICROSCOPIC
Bacteria, UA: NONE SEEN
Bilirubin Urine: NEGATIVE
Glucose, UA: NEGATIVE mg/dL
Hgb urine dipstick: NEGATIVE
Ketones, ur: 5 mg/dL — AB
Nitrite: NEGATIVE
Protein, ur: 30 mg/dL — AB
Specific Gravity, Urine: 1.017 (ref 1.005–1.030)
pH: 7 (ref 5.0–8.0)

## 2018-01-12 LAB — CBC
HCT: 33.2 % — ABNORMAL LOW (ref 36.0–46.0)
Hemoglobin: 10.7 g/dL — ABNORMAL LOW (ref 12.0–15.0)
MCH: 28.9 pg (ref 26.0–34.0)
MCHC: 32.2 g/dL (ref 30.0–36.0)
MCV: 89.7 fL (ref 80.0–100.0)
Platelets: 221 10*3/uL (ref 150–400)
RBC: 3.7 MIL/uL — ABNORMAL LOW (ref 3.87–5.11)
RDW: 20.5 % — ABNORMAL HIGH (ref 11.5–15.5)
WBC: 3.8 10*3/uL — ABNORMAL LOW (ref 4.0–10.5)
nRBC: 0.5 % — ABNORMAL HIGH (ref 0.0–0.2)

## 2018-01-12 MED ORDER — PROCHLORPERAZINE EDISYLATE 10 MG/2ML IJ SOLN
10.0000 mg | Freq: Once | INTRAMUSCULAR | Status: AC
  Administered 2018-01-12: 10 mg via INTRAVENOUS
  Filled 2018-01-12: qty 2

## 2018-01-12 MED ORDER — HYDROMORPHONE HCL 1 MG/ML IJ SOLN
1.0000 mg | Freq: Once | INTRAMUSCULAR | Status: AC
  Administered 2018-01-12: 1 mg via INTRAVENOUS
  Filled 2018-01-12: qty 1

## 2018-01-12 MED ORDER — NITROGLYCERIN 0.4 MG SL SUBL: 0.4000 mg | SUBLINGUAL_TABLET | Freq: Once | SUBLINGUAL | Status: DC

## 2018-01-12 MED ORDER — LORAZEPAM 1 MG PO TABS
0.5000 mg | ORAL_TABLET | Freq: Once | ORAL | Status: AC
  Administered 2018-01-12: 0.5 mg via SUBLINGUAL
  Filled 2018-01-12: qty 1

## 2018-01-12 MED ORDER — SODIUM CHLORIDE 0.9 % IV BOLUS
1000.0000 mL | Freq: Once | INTRAVENOUS | Status: AC
  Administered 2018-01-12: 1000 mL via INTRAVENOUS

## 2018-01-12 MED ORDER — METOCLOPRAMIDE HCL 5 MG/ML IJ SOLN
10.0000 mg | Freq: Once | INTRAMUSCULAR | Status: AC
  Administered 2018-01-12: 10 mg via INTRAVENOUS
  Filled 2018-01-12: qty 2

## 2018-01-12 NOTE — ED Provider Notes (Signed)
[facility] EMERGENCY DEPARTMENT Provider Note   CSN: [id number] Arrival date & time: 01/12/18  1435     History   Chief Complaint Chief Complaint  Patient presents with  . Abdominal Pain    HPI Tina Watkins is a 52 y.o. female.  The history is provided by the patient.  Abdominal Pain   This is a recurrent problem. The current episode started yesterday. The problem occurs constantly. The problem has not changed since onset.Associated with: States she is having a gastroparesis flare.  N/V and abdominal pain all day, unable to keep anything down.  The pain is located in the generalized abdominal region. The quality of the pain is aching and dull. The pain is at a severity of 5/10. The pain is moderate. Associated symptoms include anorexia, nausea and vomiting. Pertinent negatives include fever, dysuria, hematuria and arthralgias. Nothing aggravates the symptoms. Nothing relieves the symptoms. Past workup includes GI consult.    Past Medical History:  Diagnosis Date  . Bacteremia due to Gram-negative bacteria 05/23/2011  . Blind right eye   . Chronic lower back pain   . Complication of anesthesia    "woke up during OR; I have an extremely high tolerance" (12/11/2011) 1 procedure was graft; the other procedures were procedures that are typically done with sedation.  . DDD (degenerative disc disease), cervical   . Depression   . Dysrhythmia    "tachycardia" (12/11/2011) new onset afib 10/15/14 EKG  . E coli bacteremia 06/18/2011  . ESRD (end stage renal disease) ([place]) 06/12/2011  . Fibromyalgia   . Gastroparesis   . Gastroparesis   . GERD (gastroesophageal reflux disease)   . Glaucoma    right eye  . Gout   . H/O carpal tunnel syndrome   . Headache(784.0)    "not often anymore" (12/11/2011)  . Herpes genitalia 1994  . History of blood transfusion    "more than a few times" (12/11/2011)  . History of stomach ulcers   . Hypotension   . Iron deficiency  anemia   . New onset a-fib ([place])    10/15/14 EKG  . Osteopenia   . Seizures ([place]) 1994   "post transplant; only have had that one" (12/11/2011)  . Spinal stenosis in cervical region   . Stroke [facility])     left basal ganglia lacunar infarct; Right frontal lobe lacunar infarct.  . Stroke [facility]) ~ 1999; 2001   "briefly lost my vision; lost my right eye" (12/11/2011)    Patient Active Problem List   Diagnosis Date Noted  . Tobacco abuse 11/20/2017  . Right leg swelling 11/20/2017  . CKD (chronic kidney disease), stage III ([place]) 11/20/2017  . Right leg pain 11/20/2017  . Stroke (cerebrum) ([place]) 11/20/2017  . GERD (gastroesophageal reflux disease) 11/20/2017  . Acute venous embolism and thrombosis of deep vessels of proximal lower extremity, right ([employer]) 11/20/2017  . 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 Thrush, oral   . AKI (acute kidney injury) ([place])   . Multifocal pneumonia 08/09/2017  . Chest pain 09/29/2015  . Cervical stenosis of spinal canal 01/08/2015  . Urinary tract infectious disease   . Muscle spasms of neck 06/15/2014  . Bleeding hemorrhoid 06/15/2014  . Anemia in chronic kidney disease 06/15/2014  . Acute on chronic renal failure ([place]) 06/13/2014  . Pyelonephritis, acute 06/13/2014  . Sepsis ([place]) 06/13/2014  . History of kidney transplant   . Chronic pain  syndrome 04/09/2012  . Dehydration, mild 04/09/2012  . Gout attack 06/23/2011  . Herpes infection 06/23/2011  . Anxiety 06/23/2011  . E coli bacteremia 06/18/2011  . ESRD (end stage renal disease) ([place]) 06/12/2011  . UTI (lower urinary tract infection) 06/12/2011  . Bacteremia due to Gram-negative bacteria 05/23/2011  . History of renal transplantation 05/22/2011  . Septic shock(785.52) 05/22/2011  . Acute on chronic kidney failure ([place]) 05/22/2011  . Gastroparesis 04/24/2008  . WEIGHT LOSS 08/24/2007  . NAUSEA AND VOMITING 08/24/2007    Past Surgical History:    Procedure Laterality Date  . ANTERIOR CERVICAL DECOMP/DISCECTOMY FUSION N/A 01/08/2015   Procedure: Anterior Cervical Three-Four/Four-Five Decompression/Diskectomy/Fusion;  Surgeon: Leeroy Cha, MD;  Location: [facility]URO ORS;  Service: Neurosurgery;  Laterality: N/A;  C3-4 C4-5 Anterior cervical decompression/diskectomy/fusion  . APPENDECTOMY  ~ 2004  . CATARACT EXTRACTION     right eye  . COLONOSCOPY    . ENUCLEATION  2001   "right"  . ESOPHAGOGASTRODUODENOSCOPY (EGD) WITH PROPOFOL N/A 04/21/2012   Procedure: ESOPHAGOGASTRODUODENOSCOPY (EGD) WITH PROPOFOL;  Surgeon: Milus Banister, MD;  Location: [facility] ENDOSCOPY;  Service: Endoscopy;  Laterality: N/A;  . 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 [place]; 1999; 2005   "right"  . MULTIPLE TOOTH EXTRACTIONS    . TONSILLECTOMY    . TOTAL NEPHRECTOMY  1988?; 1994; 2005     OB History   None      Home Medications    Prior to Admission medications   Medication Sig Start Date End Date Taking? Authorizing Provider  aspirin EC 81 MG tablet Take 81 mg by mouth daily.    Yes [provider]  azaTHIOprine (IMURAN) 50 MG tablet Take 100 mg by mouth daily.    Yes [provider]  colchicine 0.6 MG tablet Take 0.6-1.2 mg by mouth See admin instructions. Take two tablets by mouth at onset, then take one tablet by mouth three times daily as needed for gout   Yes [provider]  furosemide (LASIX) 40 MG tablet Take 40 mg by mouth at bedtime.  05/09/17  Yes [provider]  latanoprost (XALATAN) 0.005 % ophthalmic solution Place 1 drop into the left eye at bedtime. 03/30/17  Yes [provider]  metoCLOPramide (REGLAN) 5 MG tablet TAKE 1 TABLET BY MOUTH TWICE A DAY AT 8AM AND 10 PM Patient taking differently: Take 5 mg by mouth 2 (two) times daily.  08/06/17  Yes Milus Banister, MD  omeprazole (PRILOSEC) 40 MG capsule Take 1  capsule (40 mg total) by mouth daily. Patient taking differently: Take 40 mg by mouth 2 (two) times daily.  08/17/17  Yes Riccio, Gardiner Rhyme, DO  oxyCODONE-acetaminophen (PERCOCET) 10-325 MG tablet Take 1 tablet by mouth every 6 (six) hours as needed for pain.   Yes [provider]  predniSONE (DELTASONE) 5 MG tablet Take 5 mg by mouth daily. 06/22/11  Yes [name], MD  PROVENTIL HFA [phone] Base) MCG/ACT inhaler Inhale 2 puffs into the lungs 4 (four) times daily as needed for shortness of breath. 08/04/17  Yes [provider]  tacrolimus (PROGRAF) 1 MG capsule Take 2-3 mg by mouth See admin instructions. Take 3 capsules every morning and take 2 capsules every evening 06/22/11  Yes [name], MD  warfarin (COUMADIN) 7.5 MG tablet Take 7.5 mg by mouth daily. 01/01/18  Yes  [provider]  zolpidem (AMBIEN) 10 MG tablet Take 10 mg by mouth at bedtime as needed for sleep.   Yes [provider]  fluticasone (FLONASE) 50 MCG/ACT nasal spray Place 1 spray into both nostrils daily. Patient not taking: Reported on 11/20/2017 07/18/17   Caccavale, Sophia, PA-C  nicotine (NICODERM CQ - DOSED IN MG/24 HOURS) 21 mg/24hr patch Place 1 patch (21 mg total) onto the skin daily. Patient not taking: Reported on 01/12/2018 11/21/17   Lady Deutscher, MD  ondansetron (ZOFRAN ODT) 4 MG disintegrating tablet Take 1 tablet (4 mg total) by mouth every 6 (six) hours as needed. Patient not taking: Reported on 11/20/2017 06/05/17   Ward, Delice Bison, DO  warfarin (COUMADIN) 5 MG tablet Take 1 tablet (5 mg total) by mouth daily at 6 PM for 15 days. Patient not taking: Reported on 01/12/2018 11/20/17 01/12/18  Lady Deutscher, MD    Family History Family History  Problem Relation Age of Onset  . Glaucoma Mother   . Multiple sclerosis Brother   . Hypertension Maternal Grandmother   . Breast cancer Neg Hx     Social History Social History   Tobacco Use  . Smoking  status: Current Every Day Smoker    Packs/day: 0.20    Years: 28.00    Pack years: 5.60    Types: Cigarettes  . Smokeless tobacco: Never Used  . Tobacco comment: had quit 03/05/2011  Substance Use Topics  . Alcohol use: Yes    Comment: rare  . Drug use: Yes    Types: Marijuana     Allergies   Levofloxacin   Review of Systems Review of Systems  Constitutional: Negative for chills and fever.  HENT: Negative for ear pain and sore throat.   Eyes: Negative for pain and visual disturbance.  Respiratory: Negative for cough and shortness of breath.   Cardiovascular: Negative for chest pain and palpitations.  Gastrointestinal: Positive for abdominal pain, anorexia, nausea and vomiting.  Genitourinary: Negative for dysuria and hematuria.  Musculoskeletal: Negative for arthralgias and back pain.  Skin: Negative for color change and rash.  Neurological: Negative for seizures and syncope.  All other systems reviewed and are negative.    Physical Exam Updated Vital Signs  ED Triage Vitals  Enc Vitals Group     BP 01/12/18 1449 127/81     Pulse Rate 01/12/18 1449 82     Resp 01/12/18 1449 (!) 30     Temp 01/12/18 1449 97.8 F (36.6 C)     Temp Source 01/12/18 1449 Oral     SpO2 01/12/18 1449 100 %     Weight 01/12/18 1449 120 lb (54.4 kg)     Height 01/12/18 1449 4\' 11"  (1.499 m)     Head Circumference --      Peak Flow --      Pain Score 01/12/18 1450 10     Pain Loc --      Pain Edu? --      Excl. in [place]? --     Physical Exam  Constitutional: She appears well-developed. She appears distressed.  HENT:  Head: Normocephalic and atraumatic.  Mouth/Throat: No oropharyngeal exudate.  Dry mucous membranes  Eyes: Conjunctivae are normal.  Neck: Neck supple.  Cardiovascular: Normal rate, regular rhythm, normal heart sounds and intact distal pulses.  No murmur heard. Pulmonary/Chest: Effort normal and breath sounds normal. No respiratory distress.  Abdominal: Soft. There is  generalized tenderness. There is no rigidity, no rebound,  no guarding and no CVA tenderness.  Musculoskeletal: She exhibits no edema.  Neurological: She is alert.  Skin: Skin is warm and dry. Capillary refill takes less than 2 seconds.  Psychiatric: She has a normal mood and affect.  Nursing note and vitals reviewed.    ED Treatments / Results  Labs (all labs ordered are listed, but only abnormal results are displayed) Labs Reviewed  COMPREHENSIVE METABOLIC PANEL - Abnormal; Notable for the following components:      Result Value   CO2 21 (*)    BUN 25 (*)    Creatinine, Ser 1.97 (*)    GFR calc non Af Amer 29 (*)    GFR calc Af Amer 34 (*)    All other components within normal limits  CBC - Abnormal; Notable for the following components:   WBC 3.8 (*)    RBC 3.70 (*)    Hemoglobin 10.7 (*)    HCT 33.2 (*)    RDW 20.5 (*)    nRBC 0.5 (*)    All other components within normal limits  URINALYSIS, ROUTINE W REFLEX MICROSCOPIC - Abnormal; Notable for the following components:   Ketones, ur 5 (*)    Protein, ur 30 (*)    Leukocytes, UA TRACE (*)    All other components within normal limits  LIPASE, BLOOD    EKG EKG Interpretation  Date/Time:  Wednesday January 12 2018 14:48:43 EST Ventricular Rate:  83 PR Interval:  132 QRS Duration: 62 QT Interval:  388 QTC Calculation: 455 R Axis:   75 Text Interpretation:  Normal sinus rhythm Possible Lateral infarct , age undetermined Abnormal ECG Confirmed by Sherwood Gambler [phone]), editor Shon Hale [phone]) on 01/12/2018 3:29:29 PM   Radiology Dg Chest Portable 1 View  Result Date: 01/12/2018 CLINICAL DATA:  Cough and fever. EXAM: PORTABLE CHEST 1 VIEW COMPARISON:  CT chest 08/10/2017.  Chest x-ray 08/09/2017 FINDINGS: Heart size normal. Negative for heart failure or edema. Mild right middle lobe airspace disease has improved from prior studies and likely is chronic. SVC stent unchanged in position. IMPRESSION: Mild  right middle lobe airspace disease with interval improvement likely chronic lung disease based on CT. Negative for edema or effusion. SVC stent unchanged Electronically Signed   By: Franchot Gallo M.D.   On: 01/12/2018 16:13    Procedures Procedures (including critical care time)  Medications Ordered in ED Medications  metoCLOPramide (REGLAN) injection 10 mg (10 mg Intravenous Given 01/12/18 1556)  sodium chloride 0.9 % bolus 1,000 mL (0 mLs Intravenous Stopped 01/12/18 1655)  HYDROmorphone (DILAUDID) injection 1 mg (1 mg Intravenous Given 01/12/18 1556)  LORazepam (ATIVAN) tablet 0.5 mg (0.5 mg Sublingual Given 01/12/18 1547)  HYDROmorphone (DILAUDID) injection 1 mg (1 mg Intravenous Given 01/12/18 1804)  prochlorperazine (COMPAZINE) injection 10 mg (10 mg Intravenous Given 01/12/18 1803)  sodium chloride 0.9 % bolus 1,000 mL (0 mLs Intravenous Stopped 01/12/18 2040)    EMERGENCY DEPARTMENT  US GUIDANCE EXAM Emergency Ultrasound:  US Guidance for Needle Guidance  INDICATIONS: Difficult vascular access Linear probe used in real-time to visualize location of needle entry through skin.   PERFORMED BY: Myself IMAGES ARCHIVED?: Yes LIMITATIONS: Pain VIEWS USED: Transverse INTERPRETATION: Right arm, needle in right arm  Initial Impression / Assessment and Plan / ED Course  I have reviewed the triage vital signs and the nursing notes.  Pertinent labs & imaging results that were available during my care of the patient were reviewed by me and considered in my  medical decision making (see chart for details).     ZAHARAH AMIR is a 52 year old female with history of gastroparesis, kidney disease status post renal transplant who presents to the ED with generalized abdominal pain, nausea, vomiting.  Patient with normal vitals.  No fever.  Patient states that she feels as if she is in a gastroparesis flare.  She is visibly uncomfortable upon examination.  She has generalized abdominal  tenderness on exam.  No concern for peritonitis at this time.  She dry heaves upon my examination.  Patient denies chest pain, shortness of breath.  She has tried her home medication without much relief.  Patient appears clinically dehydrated on exam.  Will get basic labs and initiate IV fluids, IV Reglan, IV Dilaudid, sublingual Ativan.  Patient to be reevaluated for need for admission.  No need for any imaging at this time as low concern for intra-abdominal process such as SBO.  Patient with no significant anemia, electrolyte abnormality, kidney injury.  Lipase within normal limits doubt pancreatitis.  No urinary tract infection.  Patient with good improvement after first round of medicine and will give additional IV Dilaudid, IV Compazine, second fluid bolus.  Patient feels as if she will be able to go home.  Will reevaluate once fluids finish.  Patient will undergo p.o. Challenge. Repeat abdominal exam unremarkable.  Patient with some soft blood pressures but has history of hypertension.  Blood pressure cuff was on old fistula site and when blood pressure cuff was switched to normal arm blood pressure is normal.  Patient ambulated in the ED without any issues. Patient with good improvement.  Was able to tolerate p.o.  Patient feels comfortable with discharge to home.  Was given a cab voucher and patient was discharged in eating good condition.  Recommend follow-up with primary care doctor.  This chart was dictated using voice recognition software.  Despite best efforts to proofread,  errors can occur which can change the documentation meaning.   Final Clinical Impressions(s) / ED Diagnoses   Final diagnoses:  Generalized abdominal pain    ED Discharge Orders    None       Lennice Sites, DO 01/12/18 2056

## 2018-01-12 NOTE — ED Notes (Signed)
Patient ambulated the hallway steadily without dizziness after receiving IV bolus EDP notified.

## 2018-01-12 NOTE — ED Triage Notes (Signed)
Pt with hx of gastroporesis and kidney transplant in 2005 presents with abd pain, nausea and vomiting.  Pt reports violent spasms in abd that are "locking up".  Pt states when I come in here for this they give me reglan for vomiting and dialudid for pain.  Pt is hyperventilating otherwise VSS.

## 2018-01-14 ENCOUNTER — Encounter (HOSPITAL_COMMUNITY): Payer: Medicaid Other

## 2018-01-14 ENCOUNTER — Ambulatory Visit (HOSPITAL_COMMUNITY)
Admission: RE | Admit: 2018-01-14 | Discharge: 2018-01-14 | Disposition: A | Discharge status: Home / Self Care | Payer: Medicaid Other | Source: Ambulatory Visit | Attending: Nephrology | Admitting: Nephrology

## 2018-01-14 VITALS — BP 98/66 | HR 65 | Temp 98.2°F | Resp 20

## 2018-01-14 DIAGNOSIS — D631 Anemia in chronic kidney disease: Secondary | ICD-10-CM | POA: Insufficient documentation

## 2018-01-14 DIAGNOSIS — N185 Chronic kidney disease, stage 5: Secondary | ICD-10-CM | POA: Insufficient documentation

## 2018-01-14 DIAGNOSIS — Z940 Kidney transplant status: Secondary | ICD-10-CM | POA: Insufficient documentation

## 2018-01-14 MED ORDER — EPOETIN ALFA 20000 UNIT/ML IJ SOLN: 40000.0000 [IU] | INTRAMUSCULAR | Status: DC

## 2018-01-14 MED ORDER — EPOETIN ALFA 40000 UNIT/ML IJ SOLN
INTRAMUSCULAR | Status: AC
  Administered 2018-01-14: 40000 [IU]
  Filled 2018-01-14: qty 1

## 2018-01-17 IMAGING — CR DG CERVICAL SPINE 2 OR 3 VIEWS
3 series · 3 of 3 positions shown · non-contrast
Comparison: CT, 03/17/2015

CLINICAL DATA: Hx cervical surg [DATE], pain both shoulders, no
trauma, unable to remove ear rings

EXAM:
CERVICAL SPINE - 2-3 VIEW

[w c-spine lat]
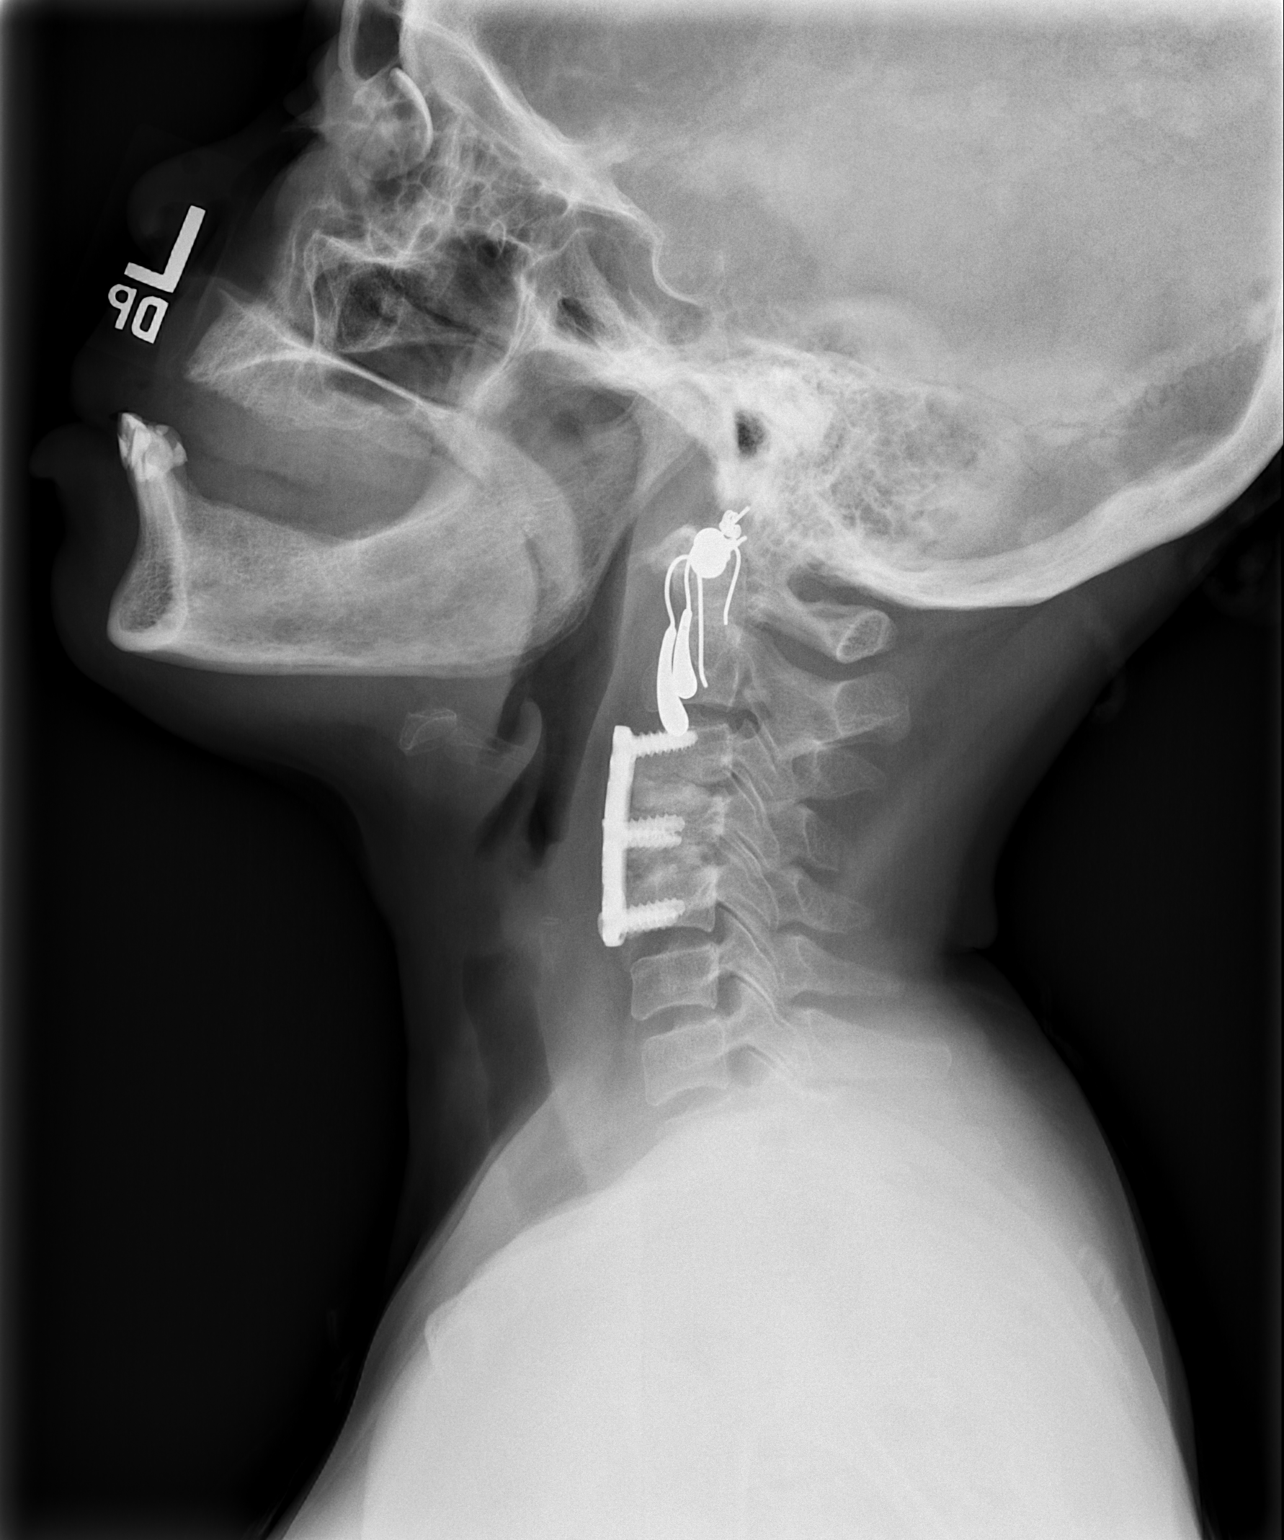

[w c-spine a.p. *]
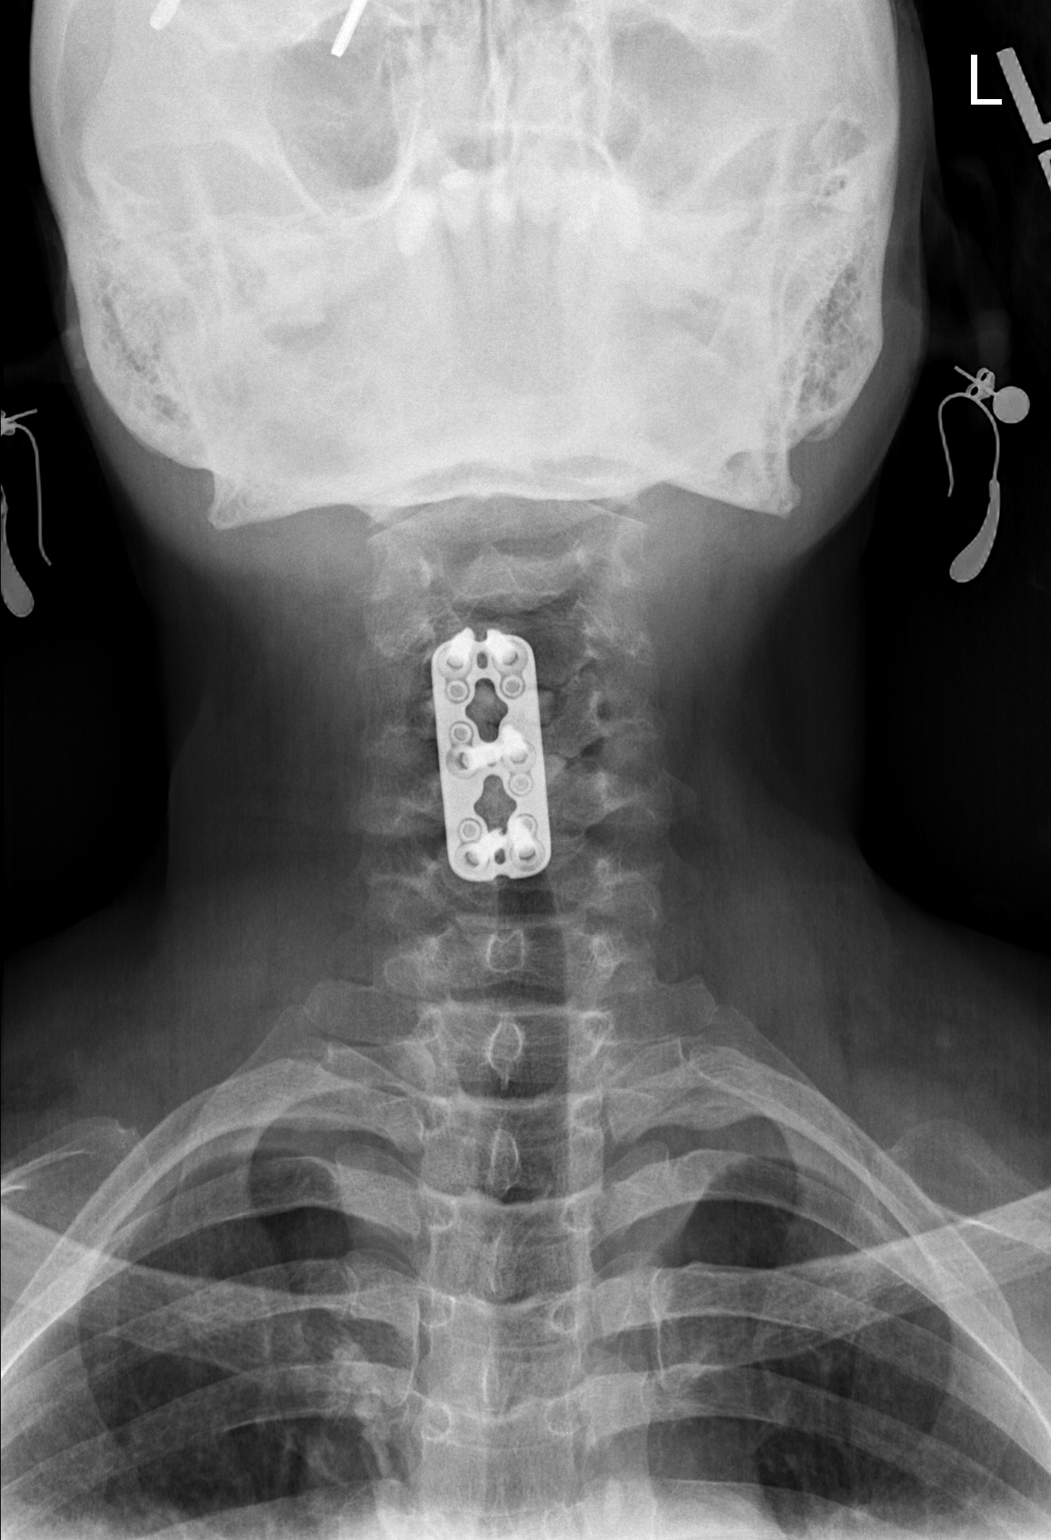

[w c-spine odontoid *]
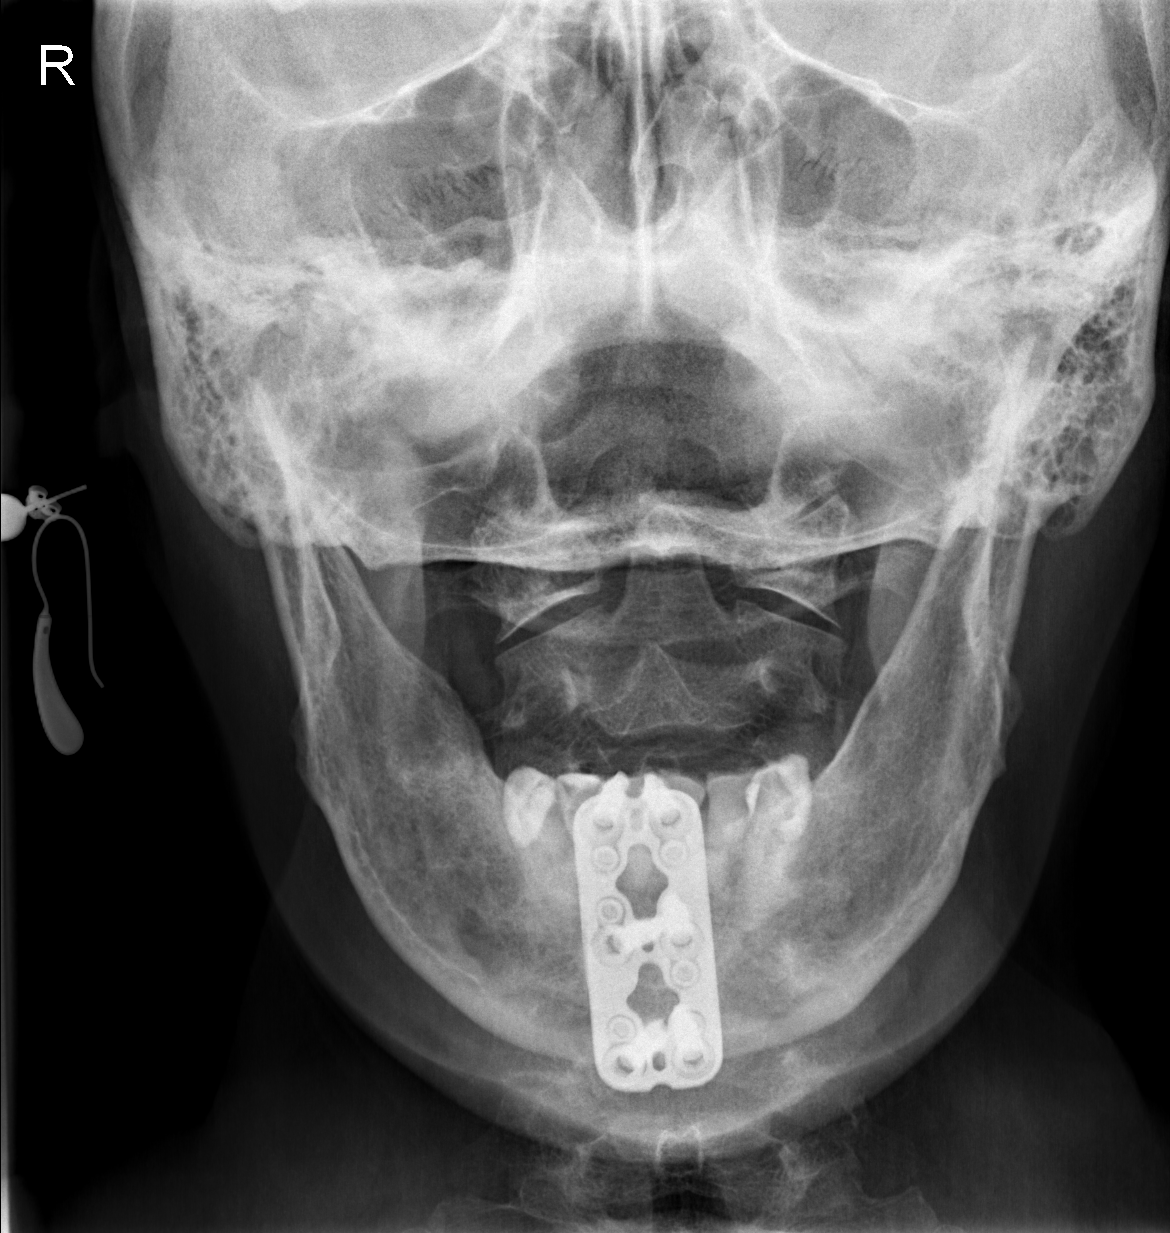

[3 of 3 positions shown; findings below may reference images not displayed]

FINDINGS: No fracture.  No spondylolisthesis.

Status post anterior cervical spine fusion at C3, C4 and C5.
Significant loss of vertebral height at C4. Dense spacer material
maintains disc height at the C3-C4 and C4-C5 levels. The orthopedic
hardware is well-seated with no evidence of loosening.

Remaining disc levels are unremarkable.

Soft tissues are unremarkable.
IMPRESSION: Stable changes from an anterior cervical spine fusion from C3
through C5. No fracture. No spondylolisthesis. No evidence of
loosening of the orthopedic hardware.

## 2018-01-21 DIAGNOSIS — E559 Vitamin D deficiency, unspecified: Secondary | ICD-10-CM | POA: Diagnosis not present

## 2018-01-21 DIAGNOSIS — Z7901 Long term (current) use of anticoagulants: Secondary | ICD-10-CM | POA: Diagnosis not present

## 2018-01-21 DIAGNOSIS — E782 Mixed hyperlipidemia: Secondary | ICD-10-CM | POA: Diagnosis not present

## 2018-01-21 DIAGNOSIS — I82411 Acute embolism and thrombosis of right femoral vein: Secondary | ICD-10-CM | POA: Diagnosis not present

## 2018-01-21 DIAGNOSIS — M542 Cervicalgia: Secondary | ICD-10-CM | POA: Diagnosis not present

## 2018-01-21 DIAGNOSIS — Z79899 Other long term (current) drug therapy: Secondary | ICD-10-CM | POA: Diagnosis not present

## 2018-01-24 DIAGNOSIS — H401122 Primary open-angle glaucoma, left eye, moderate stage: Secondary | ICD-10-CM | POA: Diagnosis not present

## 2018-01-24 IMAGING — CR DG CHEST 2V
2 series · 2 of 2 positions shown · non-contrast
Comparison: 09/29/2015

CLINICAL DATA: Hospital acquired pneumonia, history smoking,
end-stage renal disease, smoker

EXAM:
CHEST  2 VIEW

[w chest pa]
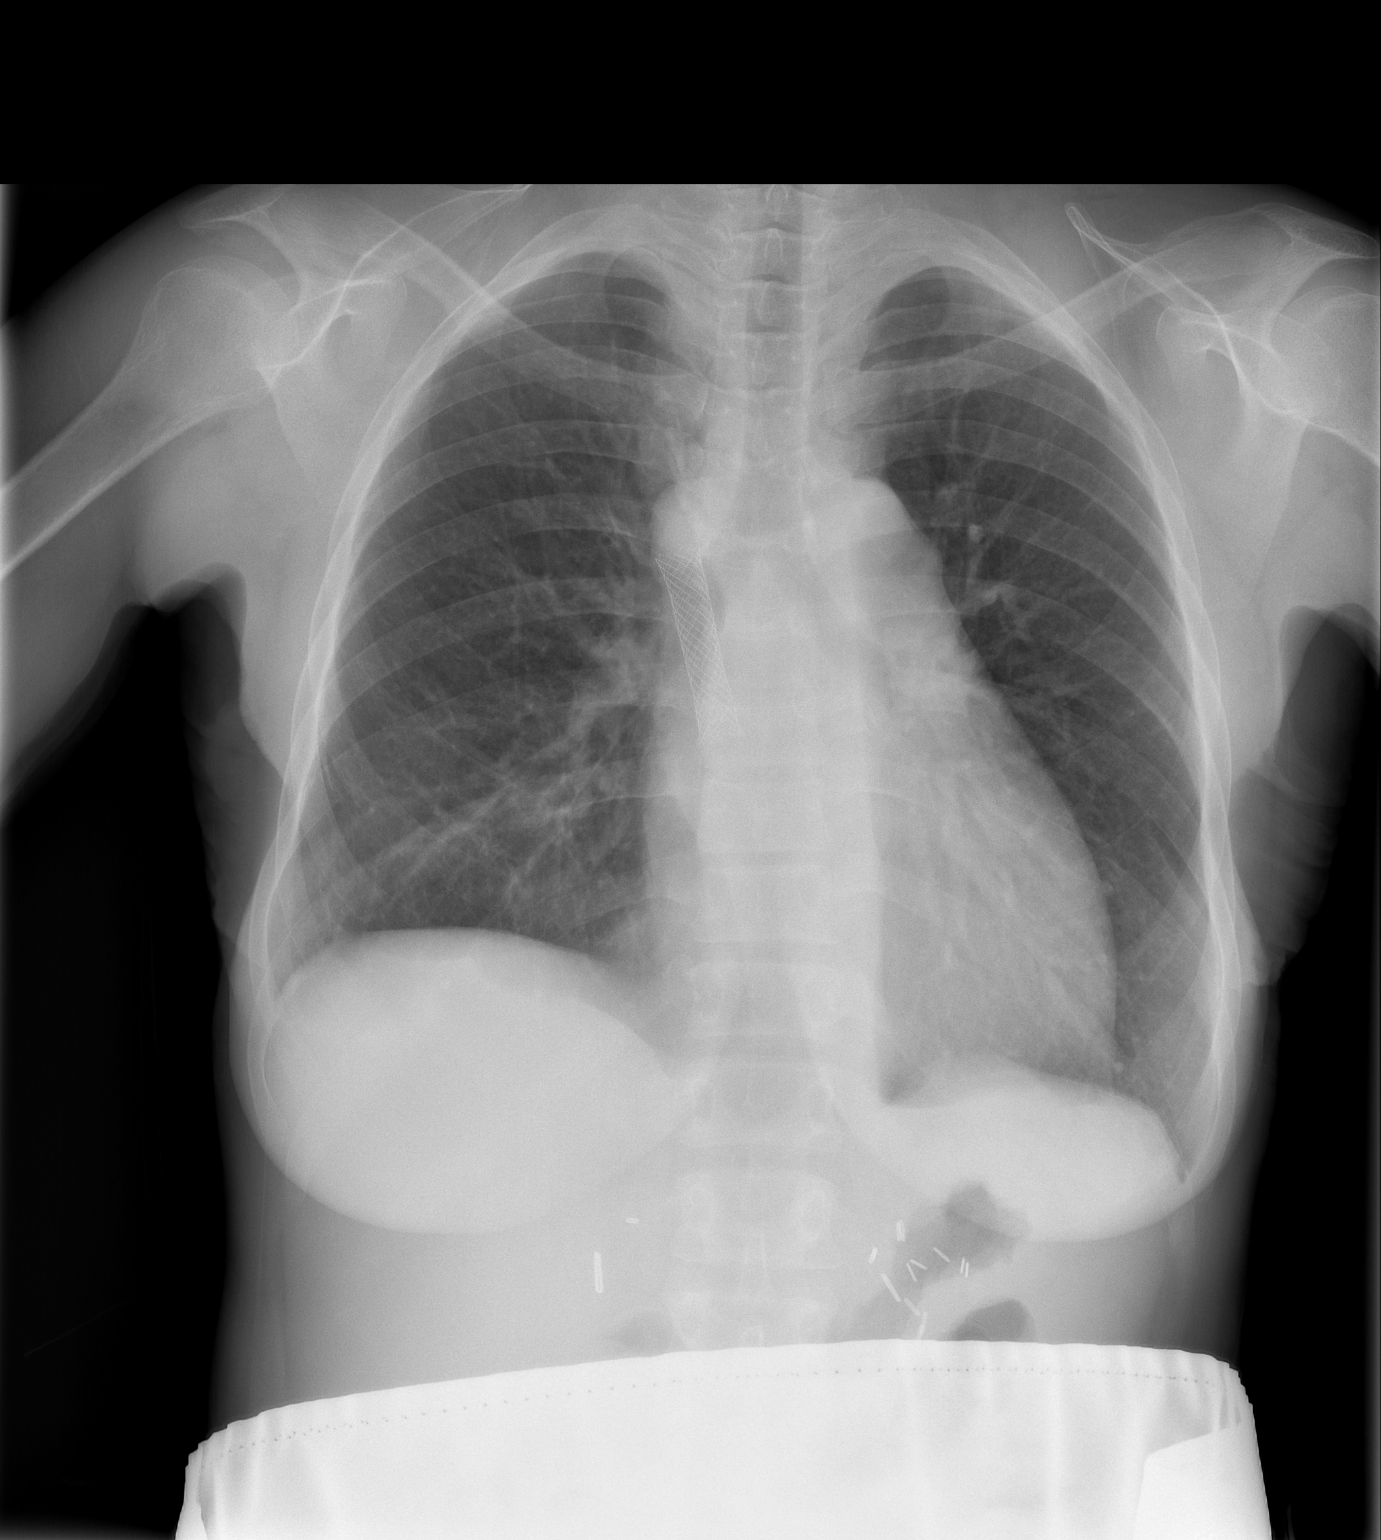

[w chest lat]
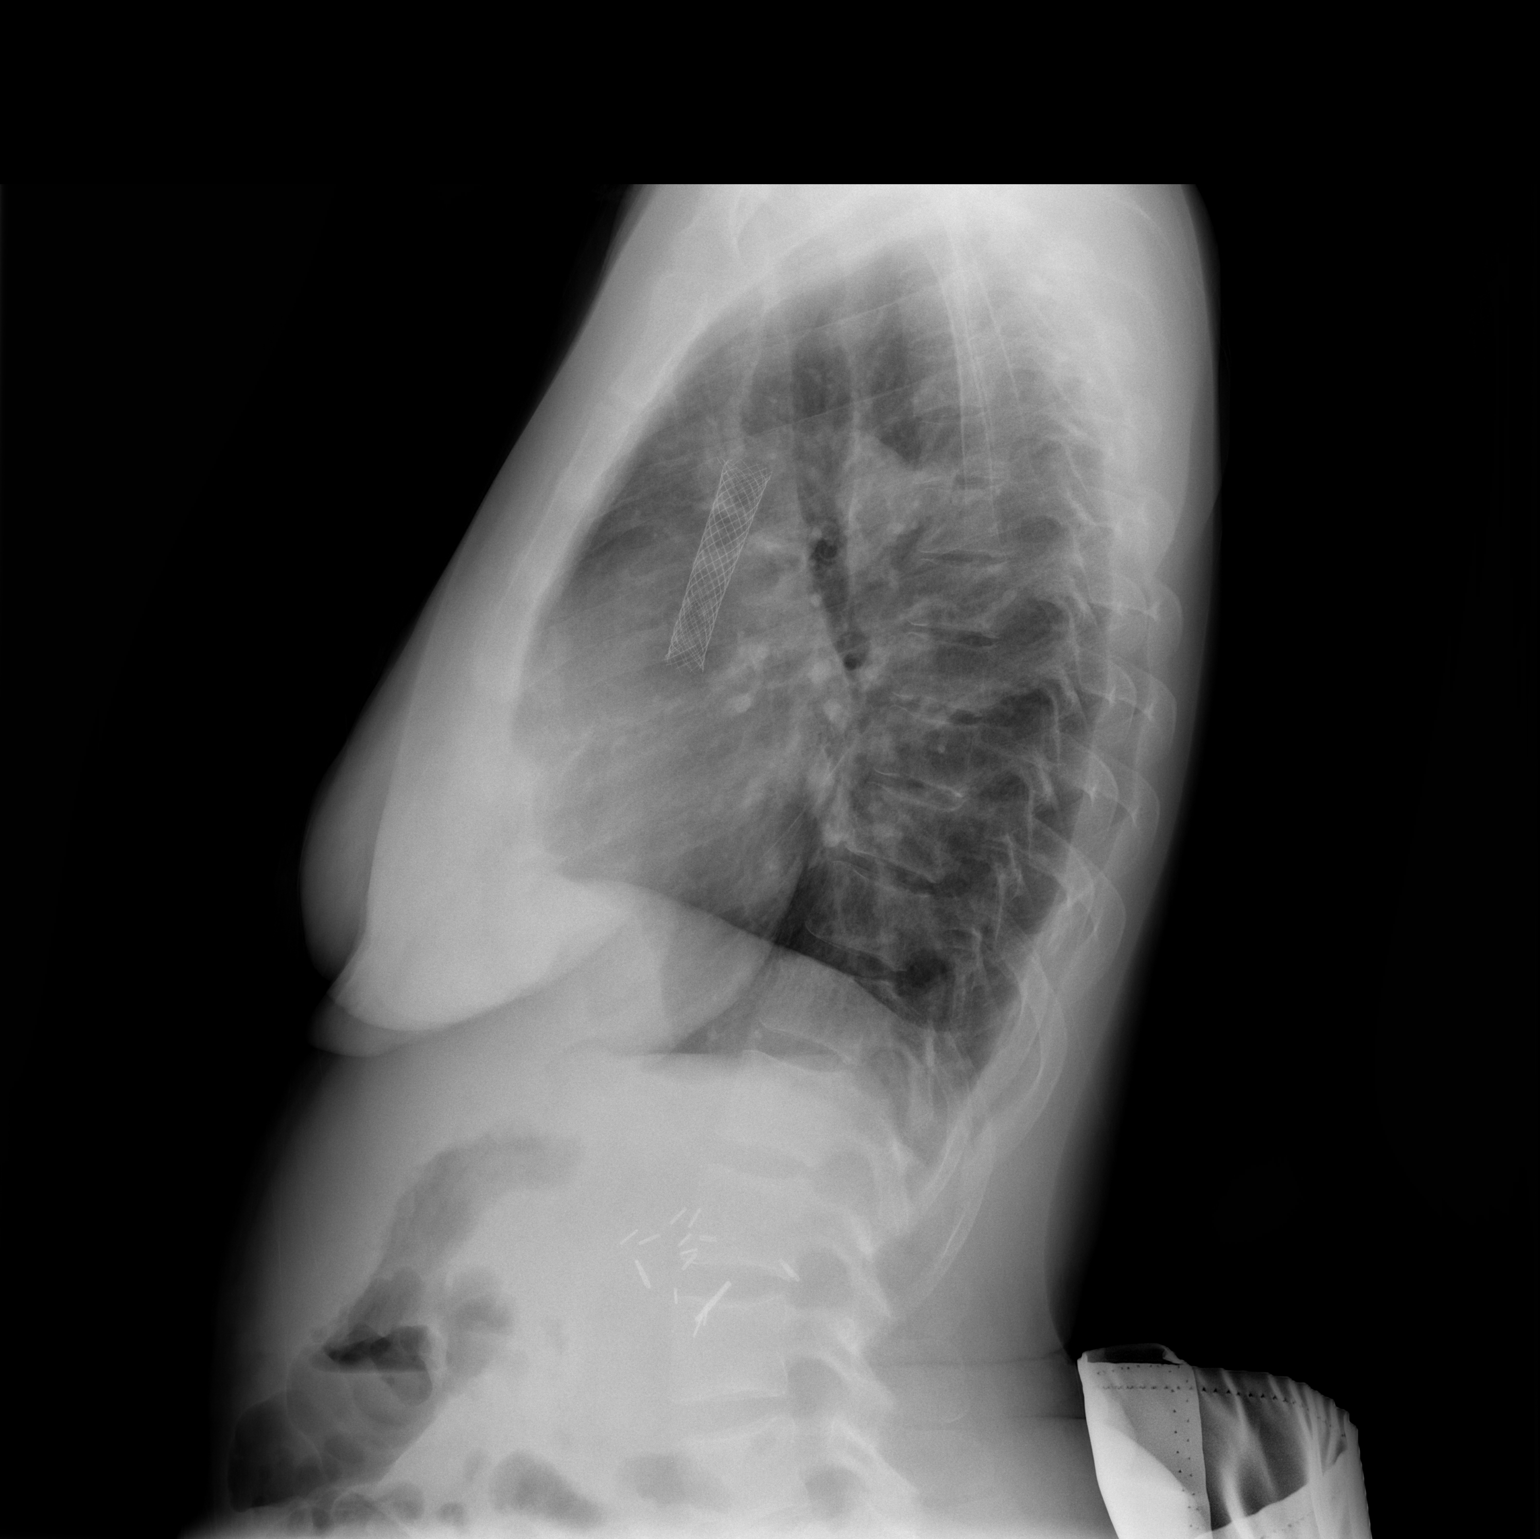

[2 of 2 positions shown; findings below may reference images not displayed]

FINDINGS: SVC wall stent again identified.

Prior cervical spine fusion.

Upper normal size of cardiac silhouette.

Mediastinal contours and pulmonary vascularity normal.

Lungs clear with resolution of bibasilar opacities seen on previous
exam.

No pleural effusion or pneumothorax.

Osseous demineralization.

Surgical clips in the upper abdomen bilaterally.
IMPRESSION: SVC stent.

No acute abnormalities.

## 2018-01-28 ENCOUNTER — Ambulatory Visit (HOSPITAL_COMMUNITY)
Admission: RE | Admit: 2018-01-28 | Discharge: 2018-01-28 | Disposition: A | Discharge status: Home / Self Care | Payer: Medicaid Other | Source: Ambulatory Visit | Attending: Nephrology | Admitting: Nephrology

## 2018-01-28 VITALS — BP 99/49 | HR 72 | Temp 98.2°F | Resp 20

## 2018-01-28 DIAGNOSIS — Z940 Kidney transplant status: Secondary | ICD-10-CM

## 2018-01-28 DIAGNOSIS — D631 Anemia in chronic kidney disease: Secondary | ICD-10-CM

## 2018-01-28 DIAGNOSIS — N185 Chronic kidney disease, stage 5: Secondary | ICD-10-CM | POA: Diagnosis not present

## 2018-01-28 LAB — POCT HEMOGLOBIN-HEMACUE: Hemoglobin: 9.4 g/dL — ABNORMAL LOW (ref 12.0–15.0)

## 2018-01-28 MED ORDER — EPOETIN ALFA 20000 UNIT/ML IJ SOLN: 40000.0000 [IU] | INTRAMUSCULAR | Status: DC

## 2018-01-28 MED ORDER — EPOETIN ALFA 40000 UNIT/ML IJ SOLN
INTRAMUSCULAR | Status: AC
  Administered 2018-01-28: 40000 [IU] via SUBCUTANEOUS
  Filled 2018-01-28: qty 1

## 2018-01-29 LAB — PTH, INTACT AND CALCIUM
Calcium, Total (PTH): 9.1 mg/dL (ref 8.7–10.2)
PTH: 224 pg/mL — ABNORMAL HIGH (ref 15–65)

## 2018-02-11 ENCOUNTER — Ambulatory Visit (HOSPITAL_COMMUNITY)
Admission: RE | Admit: 2018-02-11 | Discharge: 2018-02-11 | Disposition: A | Discharge status: Home / Self Care | Payer: Medicaid Other | Source: Ambulatory Visit | Attending: Nephrology | Admitting: Nephrology

## 2018-02-11 VITALS — BP 101/55 | HR 66 | Temp 98.5°F | Resp 20

## 2018-02-11 DIAGNOSIS — Z940 Kidney transplant status: Secondary | ICD-10-CM | POA: Insufficient documentation

## 2018-02-11 DIAGNOSIS — N185 Chronic kidney disease, stage 5: Secondary | ICD-10-CM | POA: Diagnosis not present

## 2018-02-11 DIAGNOSIS — D631 Anemia in chronic kidney disease: Secondary | ICD-10-CM

## 2018-02-11 LAB — IRON AND TIBC
Iron: 90 ug/dL (ref 28–170)
Saturation Ratios: 39 % — ABNORMAL HIGH (ref 10.4–31.8)
TIBC: 230 ug/dL — ABNORMAL LOW (ref 250–450)
UIBC: 140 ug/dL

## 2018-02-11 LAB — POCT HEMOGLOBIN-HEMACUE: Hemoglobin: 9.8 g/dL — ABNORMAL LOW (ref 12.0–15.0)

## 2018-02-11 LAB — PHOSPHORUS: Phosphorus: 4.4 mg/dL (ref 2.5–4.6)

## 2018-02-11 LAB — FERRITIN: Ferritin: 974 ng/mL — ABNORMAL HIGH (ref 11–307)

## 2018-02-11 MED ORDER — EPOETIN ALFA 20000 UNIT/ML IJ SOLN: 40000.0000 [IU] | INTRAMUSCULAR | Status: DC

## 2018-02-11 MED ORDER — EPOETIN ALFA 40000 UNIT/ML IJ SOLN
INTRAMUSCULAR | Status: AC
  Administered 2018-02-11: 40000 [IU]
  Filled 2018-02-11: qty 1

## 2018-02-13 LAB — TACROLIMUS LEVEL: Tacrolimus (FK506) - LabCorp: 6.7 ng/mL (ref 2.0–20.0)

## 2018-02-21 DIAGNOSIS — I82411 Acute embolism and thrombosis of right femoral vein: Secondary | ICD-10-CM | POA: Diagnosis not present

## 2018-02-21 DIAGNOSIS — M542 Cervicalgia: Secondary | ICD-10-CM | POA: Diagnosis not present

## 2018-02-21 DIAGNOSIS — Z79899 Other long term (current) drug therapy: Secondary | ICD-10-CM | POA: Diagnosis not present

## 2018-02-21 DIAGNOSIS — E559 Vitamin D deficiency, unspecified: Secondary | ICD-10-CM | POA: Diagnosis not present

## 2018-02-21 DIAGNOSIS — Z7901 Long term (current) use of anticoagulants: Secondary | ICD-10-CM | POA: Diagnosis not present

## 2018-02-25 ENCOUNTER — Ambulatory Visit (HOSPITAL_COMMUNITY)
Admission: RE | Admit: 2018-02-25 | Discharge: 2018-02-25 | Disposition: A | Discharge status: Home / Self Care | Payer: Medicaid Other | Source: Ambulatory Visit | Attending: Nephrology | Admitting: Nephrology

## 2018-02-25 VITALS — BP 94/52 | HR 74 | Temp 98.7°F | Resp 20

## 2018-02-25 DIAGNOSIS — Z940 Kidney transplant status: Secondary | ICD-10-CM

## 2018-02-25 DIAGNOSIS — N185 Chronic kidney disease, stage 5: Secondary | ICD-10-CM | POA: Insufficient documentation

## 2018-02-25 DIAGNOSIS — D631 Anemia in chronic kidney disease: Secondary | ICD-10-CM

## 2018-02-25 LAB — POCT HEMOGLOBIN-HEMACUE: Hemoglobin: 11.4 g/dL — ABNORMAL LOW (ref 12.0–15.0)

## 2018-02-25 MED ORDER — EPOETIN ALFA 40000 UNIT/ML IJ SOLN
INTRAMUSCULAR | Status: AC
  Administered 2018-02-25: 40000 [IU] via SUBCUTANEOUS
  Filled 2018-02-25: qty 1

## 2018-02-25 MED ORDER — EPOETIN ALFA 20000 UNIT/ML IJ SOLN: 40000.0000 [IU] | INTRAMUSCULAR | Status: DC

## 2018-03-11 ENCOUNTER — Ambulatory Visit (HOSPITAL_COMMUNITY)
Admission: RE | Admit: 2018-03-11 | Discharge: 2018-03-11 | Disposition: A | Discharge status: Home / Self Care | Payer: Medicaid Other | Source: Ambulatory Visit | Attending: Nephrology | Admitting: Nephrology

## 2018-03-11 VITALS — BP 89/56 | HR 69 | Temp 99.0°F | Resp 20

## 2018-03-11 DIAGNOSIS — Z940 Kidney transplant status: Secondary | ICD-10-CM | POA: Insufficient documentation

## 2018-03-11 DIAGNOSIS — N185 Chronic kidney disease, stage 5: Secondary | ICD-10-CM | POA: Diagnosis not present

## 2018-03-11 DIAGNOSIS — D631 Anemia in chronic kidney disease: Secondary | ICD-10-CM | POA: Diagnosis not present

## 2018-03-11 LAB — POCT HEMOGLOBIN-HEMACUE: Hemoglobin: 9.8 g/dL — ABNORMAL LOW (ref 12.0–15.0)

## 2018-03-11 LAB — COMPREHENSIVE METABOLIC PANEL
ALT: 8 U/L (ref 0–44)
AST: 14 U/L — ABNORMAL LOW (ref 15–41)
Albumin: 3.8 g/dL (ref 3.5–5.0)
Alkaline Phosphatase: 85 U/L (ref 38–126)
Anion gap: 12 (ref 5–15)
BUN: 29 mg/dL — ABNORMAL HIGH (ref 6–20)
CO2: 19 mmol/L — ABNORMAL LOW (ref 22–32)
Calcium: 9.1 mg/dL (ref 8.9–10.3)
Chloride: 108 mmol/L (ref 98–111)
Creatinine, Ser: 2.41 mg/dL — ABNORMAL HIGH (ref 0.44–1.00)
GFR calc Af Amer: 26 mL/min — ABNORMAL LOW (ref >60)
GFR calc non Af Amer: 23 mL/min — ABNORMAL LOW (ref >60)
Glucose, Bld: 96 mg/dL (ref 70–99)
Potassium: 4.1 mmol/L (ref 3.5–5.1)
Sodium: 139 mmol/L (ref 135–145)
Total Bilirubin: 0.7 mg/dL (ref 0.3–1.2)
Total Protein: 6 g/dL — ABNORMAL LOW (ref 6.5–8.1)

## 2018-03-11 MED ORDER — EPOETIN ALFA 40000 UNIT/ML IJ SOLN
INTRAMUSCULAR | Status: AC
  Administered 2018-03-11: 40000 [IU] via SUBCUTANEOUS
  Filled 2018-03-11: qty 1

## 2018-03-11 MED ORDER — EPOETIN ALFA 20000 UNIT/ML IJ SOLN: 40000.0000 [IU] | INTRAMUSCULAR | Status: DC

## 2018-03-24 DIAGNOSIS — E7800 Pure hypercholesterolemia, unspecified: Secondary | ICD-10-CM | POA: Diagnosis not present

## 2018-03-24 DIAGNOSIS — I82411 Acute embolism and thrombosis of right femoral vein: Secondary | ICD-10-CM | POA: Diagnosis not present

## 2018-03-24 DIAGNOSIS — Z23 Encounter for immunization: Secondary | ICD-10-CM | POA: Diagnosis not present

## 2018-03-24 DIAGNOSIS — Z79899 Other long term (current) drug therapy: Secondary | ICD-10-CM | POA: Diagnosis not present

## 2018-03-24 DIAGNOSIS — E559 Vitamin D deficiency, unspecified: Secondary | ICD-10-CM | POA: Diagnosis not present

## 2018-03-25 ENCOUNTER — Ambulatory Visit (HOSPITAL_COMMUNITY)
Admission: RE | Admit: 2018-03-25 | Discharge: 2018-03-25 | Disposition: A | Discharge status: Home / Self Care | Payer: Medicaid Other | Source: Ambulatory Visit | Attending: Nephrology | Admitting: Nephrology

## 2018-03-25 VITALS — BP 95/60 | HR 76 | Temp 98.7°F | Resp 20

## 2018-03-25 DIAGNOSIS — Z940 Kidney transplant status: Secondary | ICD-10-CM | POA: Diagnosis not present

## 2018-03-25 DIAGNOSIS — D631 Anemia in chronic kidney disease: Secondary | ICD-10-CM | POA: Insufficient documentation

## 2018-03-25 DIAGNOSIS — N185 Chronic kidney disease, stage 5: Secondary | ICD-10-CM | POA: Insufficient documentation

## 2018-03-25 LAB — POCT HEMOGLOBIN-HEMACUE: Hemoglobin: 9.6 g/dL — ABNORMAL LOW (ref 12.0–15.0)

## 2018-03-25 MED ORDER — EPOETIN ALFA 40000 UNIT/ML IJ SOLN
INTRAMUSCULAR | Status: AC
  Administered 2018-03-25: 40000 [IU]
  Filled 2018-03-25: qty 1

## 2018-03-25 MED ORDER — EPOETIN ALFA 20000 UNIT/ML IJ SOLN: 40000.0000 [IU] | INTRAMUSCULAR | Status: DC

## 2018-04-04 ENCOUNTER — Other Ambulatory Visit (HOSPITAL_COMMUNITY): Payer: Self-pay | Admitting: Family Medicine

## 2018-04-04 ENCOUNTER — Encounter: Payer: Self-pay | Admitting: Gastroenterology

## 2018-04-04 ENCOUNTER — Ambulatory Visit: Payer: Medicaid Other | Admitting: Gastroenterology

## 2018-04-04 VITALS — BP 102/58 | HR 72 | Ht 59.0 in | Wt 114.0 lb

## 2018-04-04 DIAGNOSIS — R112 Nausea with vomiting, unspecified: Secondary | ICD-10-CM | POA: Diagnosis not present

## 2018-04-04 MED ORDER — METOCLOPRAMIDE HCL 10 MG PO TABS: 10.0000 mg | ORAL_TABLET | Freq: Three times a day (TID) | ORAL | 0 refills | Status: DC | PRN

## 2018-04-04 NOTE — Patient Instructions (Signed)
Your recent HB A1c was 9.6, need better control of your blood sugars.  Limit your use of narcotic pain meds.  Reglan 10mg  pills, take one PRN, disp 40 pills, no refills.  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

## 2018-04-04 NOTE — Progress Notes (Signed)
Review of pertinent gastrointestinal problems:  1. Chronic nausea, vomiting. EGD August 2009was normal except for small hiatal hernia. Status post kidney transplant on immunosuppressive medicines which could cause nausea, narcotics periodically. Normal complete metabolic profile, slight anemia, labs July 2009. Changing her Myfortic to Imuran helped nausea temporarily, symptoms returned. GES 03/2008 slightly slow emptying. [name] 2/201normal GB, normal bilary tree. Hospitalized 03/2008 [facility] MD put her on reglan 5mg  three times a day, GI was not consulted. March, 2010 combination of daily scheduled Zofran and t.i.d. Reglan 5 mg a day has been helpful. She was cut back to Reglan as needed to due concern for potential side effects. June 2011: Was recommended to take 1-2 Reglan pills at bedtime only. Also started Megace for appetite stimulant. January 2013: She has gained 20 pounds and her nausea is under much better control with nightly Reglan. 04/2012 EGD [facility] Torain;normal except for possible candida in esophagus, treated. 09/2013 intermittent exacerbations of her nausea, multifactorial. Chronic symptoms under good control with reglan, zofran.  HPI: This is a very pleasant 53 year old woman whom I last saw about a year and a half ago  She has chronic nausea and vomiting that is multifactorial.  Due to out-of-control diabetes, due to chronic narcotic use, due to chronic renal insufficiency, due to side effects from her kidney transplant medicines.  She has actually been under very good control for quite a while on low-dose twice daily Reglan.  5 mg she takes at bedtime and in the morning.  She had an episode of nausea and vomiting a week ago, lasted a full day. Vomited all day, but did not need to go the ER.  She had not been taking more than usual pain meds.  Takes reglan 5mg  in AM and QHS.  These work well for her usually..  We discussed tardive dyskinesia symptoms and signs and she has not had any of  these.  Hb a1c last month 9.6  Chief complaint is chronic  intermittent nausea vomiting  ROS: complete GI ROS as described in HPI, all other review negative.  Constitutional:  No unintentional weight loss   Past Medical History:  Diagnosis Date  . Bacteremia due to Gram-negative bacteria 05/23/2011  . Blind right eye   . Chronic lower back pain   . Complication of anesthesia    "woke up during OR; I have an extremely high tolerance" (12/11/2011) 1 procedure was graft; the other procedures were procedures that are typically done with sedation.  . DDD (degenerative disc disease), cervical   . Depression   . Dysrhythmia    "tachycardia" (12/11/2011) new onset afib 10/15/14 EKG  . E coli bacteremia 06/18/2011  . ESRD (end stage renal disease) ([place]) 06/12/2011  . Fibromyalgia   . Gastroparesis   . Gastroparesis   . GERD (gastroesophageal reflux disease)   . Glaucoma    right eye  . Gout   . H/O carpal tunnel syndrome   . Headache(784.0)    "not often anymore" (12/11/2011)  . Herpes genitalia 1994  . History of blood transfusion    "more than a few times" (12/11/2011)  . History of stomach ulcers   . Hypotension   . Iron deficiency anemia   . New onset a-fib ([place])    10/15/14 EKG  . Osteopenia   . Seizures ([place]) 1994   "post transplant; only have had that one" (12/11/2011)  . Spinal stenosis in cervical region   . Stroke [facility])     left basal ganglia lacunar infarct; Right  frontal lobe lacunar infarct.  . Stroke [facility]) ~ 1999; 2001   "briefly lost my vision; lost my right eye" (12/11/2011)    Past Surgical History:  Procedure Laterality Date  . ANTERIOR CERVICAL DECOMP/DISCECTOMY FUSION N/A 01/08/2015   Procedure: Anterior Cervical Three-Four/Four-Five Decompression/Diskectomy/Fusion;  Surgeon: Leeroy Cha, MD;  Location: [place] [facility];  Service: Neurosurgery;  Laterality: N/A;  C3-4 C4-5 Anterior cervical decompression/diskectomy/fusion  . APPENDECTOMY  ~ 2004  . CATARACT  EXTRACTION     right eye  . COLONOSCOPY    . ENUCLEATION  2001   "right"  . ESOPHAGOGASTRODUODENOSCOPY (EGD) WITH PROPOFOL N/A 04/21/2012   Procedure: ESOPHAGOGASTRODUODENOSCOPY (EGD) WITH PROPOFOL;  Surgeon: Milus Banister, MD;  Location: [facility] ENDOSCOPY;  Service: Endoscopy;  Laterality: N/A;  . 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 [place]; 1999; 2005   "right"  . MULTIPLE TOOTH EXTRACTIONS    . TONSILLECTOMY    . TOTAL NEPHRECTOMY  1988?; 1994; 2005    Current Outpatient Medications  Medication Sig Dispense Refill  . aspirin EC 81 MG tablet Take 81 mg by mouth daily.     [name] azaTHIOprine (IMURAN) 50 MG tablet Take 100 mg by mouth daily.     . colchicine 0.6 MG tablet Take 0.6-1.2 mg by mouth See admin instructions. Take two tablets by mouth at onset, then take one tablet by mouth three times daily as needed for gout    . fluticasone (FLONASE) 50 MCG/ACT nasal spray Place 1 spray into both nostrils daily. 16 g 0  . furosemide (LASIX) 40 MG tablet Take 40 mg by mouth at bedtime.   2  . latanoprost (XALATAN) 0.005 % ophthalmic solution Place 1 drop into the left eye at bedtime.  3  . metoCLOPramide (REGLAN) 5 MG tablet TAKE 1 TABLET BY MOUTH TWICE A DAY AT 8AM AND 10 PM (Patient taking differently: Take 5 mg by mouth 2 (two) times daily. ) 60 tablet 3  . nicotine (NICODERM CQ - DOSED IN MG/24 HOURS) 21 mg/24hr patch Place 1 patch (21 mg total) onto the skin daily. 28 patch 0  . omeprazole (PRILOSEC) 40 MG capsule Take 1 capsule (40 mg total) by mouth daily. (Patient taking differently: Take 40 mg by mouth 2 (two) times daily. ) 40 capsule 0  . ondansetron (ZOFRAN ODT) 4 MG disintegrating tablet Take 1 tablet (4 mg total) by mouth every 6 (six) hours as needed. 20 tablet 0  . oxyCODONE-acetaminophen (PERCOCET) 10-325 MG tablet Take 1 tablet by mouth every 6 (six) hours as needed for pain.    . predniSONE  (DELTASONE) 5 MG tablet Take 5 mg by mouth daily.    [name] PROVENTIL HFA 108 (90 Base) MCG/ACT inhaler Inhale 2 puffs into the lungs 4 (four) times daily as needed for shortness of breath.  2  . tacrolimus (PROGRAF) 1 MG capsule Take 2-3 mg by mouth See admin instructions. Take 3 capsules every morning and take 2 capsules every evening    . warfarin (COUMADIN) 7.5 MG tablet Take 7.5 mg by mouth daily.  2  . zolpidem (AMBIEN) 10 MG tablet Take 10 mg by mouth at bedtime as needed for sleep.    [name] warfarin (COUMADIN) 5 MG tablet Take 1 tablet (5 mg total) by mouth daily at 6 PM for 15 days. (Patient not taking: Reported on 01/12/2018) 15 tablet 0   No current  facility-administered medications for this visit.     Allergies as of 04/04/2018 - Review Complete 04/04/2018  Allergen Reaction Noted  . Levofloxacin Itching and Rash 05/23/2011    Family History  Problem Relation Age of Onset  . Glaucoma Mother   . Multiple sclerosis Brother   . Hypertension Maternal Grandmother   . Breast cancer Neg Hx     Social History   Socioeconomic History  . Marital status: Single    Spouse name: Not on file  . Number of children: 0  . Years of education: Not on file  . Highest education level: Not on file  Occupational History  . Occupation: Architect: UNEMPLOYED  Social Needs  . Financial resource strain: Not on file  . Food insecurity:    Worry: Not on file    Inability: Not on file  . Transportation needs:    Medical: Not on file    Non-medical: Not on file  Tobacco Use  . Smoking status: Current Every Day Smoker    Packs/day: 0.20    Years: 28.00    Pack years: 5.60    Types: Cigarettes  . Smokeless tobacco: Never Used  . Tobacco comment: had quit 03/05/2011  Substance and Sexual Activity  . Alcohol use: Yes    Comment: rare  . Drug use: Yes    Types: Marijuana  . Sexual activity: Yes  Lifestyle  . Physical activity:    Days per week: Not on file    Minutes per session:  Not on file  . Stress: Not on file  Relationships  . Social connections:    Talks on phone: Not on file    Gets together: Not on file    Attends religious service: Not on file    Active member of club or organization: Not on file    Attends meetings of clubs or organizations: Not on file    Relationship status: Not on file  . Intimate partner violence:    Fear of current or ex partner: Not on file    Emotionally abused: Not on file    Physically abused: Not on file    Forced sexual activity: Not on file  Other Topics Concern  . Not on file  Social History Narrative  . Not on file     Physical Exam: BP (!) 102/58   Pulse 72   Ht 4\' 11"  (1.499 m)   Wt 114 lb (51.7 kg)   SpO2 96%   BMI 23.03 kg/m  Constitutional: generally well-appearing Psychiatric: alert and oriented x3 Abdomen: soft, nontender, nondistended, no obvious ascites, no peritoneal signs, normal bowel sounds No peripheral edema noted in lower extremities  Assessment and plan: 53 y.o. female with chronic nausea vomiting, multifactorial  Out-of-control diabetes, chronic narcotic usage, chronic renal insufficiency, side effects from her renal transplant medicines all contribute to her chronic intermittent nausea and vomiting.  Reglan has significantly helped her over the past several years.  She will continue to take her 5 mg pill at bedtime and every morning when she wakes up.  I have also given her prescription of 10 mg pills, 40 of the pills that she should take in an emergency only.  We again discussed tardive dyskinesia symptoms and signs and she understands that this is a potential irreversible side effect of the medicine.  Please see the "Patient Instructions" section for addition details about the plan.  Owens Loffler, MD [facility] 04/04/2018, 10:05 AM

## 2018-04-08 ENCOUNTER — Encounter (HOSPITAL_COMMUNITY)
Admission: RE | Admit: 2018-04-08 | Discharge: 2018-04-08 | Disposition: A | Discharge status: Home / Self Care | Payer: Medicaid Other | Source: Ambulatory Visit | Attending: Nephrology | Admitting: Nephrology

## 2018-04-08 VITALS — BP 100/55 | HR 69 | Temp 99.0°F | Resp 20

## 2018-04-08 DIAGNOSIS — N185 Chronic kidney disease, stage 5: Secondary | ICD-10-CM | POA: Diagnosis not present

## 2018-04-08 DIAGNOSIS — Z940 Kidney transplant status: Secondary | ICD-10-CM | POA: Insufficient documentation

## 2018-04-08 DIAGNOSIS — D631 Anemia in chronic kidney disease: Secondary | ICD-10-CM | POA: Diagnosis not present

## 2018-04-08 LAB — FERRITIN: Ferritin: 747 ng/mL — ABNORMAL HIGH (ref 11–307)

## 2018-04-08 LAB — IRON AND TIBC
Iron: 60 ug/dL (ref 28–170)
Saturation Ratios: 28 % (ref 10.4–31.8)
TIBC: 216 ug/dL — ABNORMAL LOW (ref 250–450)
UIBC: 156 ug/dL

## 2018-04-08 LAB — POCT HEMOGLOBIN-HEMACUE: Hemoglobin: 10.2 g/dL — ABNORMAL LOW (ref 12.0–15.0)

## 2018-04-08 LAB — PHOSPHORUS: Phosphorus: 2.6 mg/dL (ref 2.5–4.6)

## 2018-04-08 MED ORDER — EPOETIN ALFA 40000 UNIT/ML IJ SOLN
INTRAMUSCULAR | Status: AC
  Administered 2018-04-08: 40000 [IU]
  Filled 2018-04-08: qty 1

## 2018-04-08 MED ORDER — EPOETIN ALFA 20000 UNIT/ML IJ SOLN: 40000.0000 [IU] | INTRAMUSCULAR | Status: DC

## 2018-04-21 ENCOUNTER — Other Ambulatory Visit: Payer: Self-pay | Admitting: Gastroenterology

## 2018-04-21 MED ORDER — METOCLOPRAMIDE HCL 5 MG PO TABS: 5.0000 mg | ORAL_TABLET | Freq: Two times a day (BID) | ORAL | 0 refills | Status: DC

## 2018-04-21 NOTE — Telephone Encounter (Signed)
Patient informed that refill sent in.

## 2018-04-21 NOTE — Telephone Encounter (Signed)
Pt requested a refill of Reglan.

## 2018-04-22 ENCOUNTER — Other Ambulatory Visit: Payer: Self-pay

## 2018-04-22 ENCOUNTER — Ambulatory Visit (HOSPITAL_COMMUNITY)
Admission: RE | Admit: 2018-04-22 | Discharge: 2018-04-22 | Disposition: A | Discharge status: Home / Self Care | Payer: Medicaid Other | Source: Ambulatory Visit | Attending: Nephrology | Admitting: Nephrology

## 2018-04-22 VITALS — BP 100/62 | HR 64 | Temp 98.9°F | Resp 20

## 2018-04-22 DIAGNOSIS — Z940 Kidney transplant status: Secondary | ICD-10-CM | POA: Insufficient documentation

## 2018-04-22 DIAGNOSIS — D631 Anemia in chronic kidney disease: Secondary | ICD-10-CM | POA: Diagnosis not present

## 2018-04-22 DIAGNOSIS — N185 Chronic kidney disease, stage 5: Secondary | ICD-10-CM | POA: Insufficient documentation

## 2018-04-22 LAB — POCT HEMOGLOBIN-HEMACUE: Hemoglobin: 9.8 g/dL — ABNORMAL LOW (ref 12.0–15.0)

## 2018-04-22 MED ORDER — EPOETIN ALFA 40000 UNIT/ML IJ SOLN
INTRAMUSCULAR | Status: AC
  Administered 2018-04-22: 40000 [IU] via SUBCUTANEOUS
  Filled 2018-04-22: qty 1

## 2018-04-22 MED ORDER — EPOETIN ALFA 20000 UNIT/ML IJ SOLN: 40000.0000 [IU] | INTRAMUSCULAR | Status: DC

## 2018-04-25 DIAGNOSIS — E559 Vitamin D deficiency, unspecified: Secondary | ICD-10-CM | POA: Diagnosis not present

## 2018-04-25 DIAGNOSIS — E7800 Pure hypercholesterolemia, unspecified: Secondary | ICD-10-CM | POA: Diagnosis not present

## 2018-04-25 DIAGNOSIS — Z79899 Other long term (current) drug therapy: Secondary | ICD-10-CM | POA: Diagnosis not present

## 2018-05-05 ENCOUNTER — Other Ambulatory Visit: Payer: Self-pay

## 2018-05-06 ENCOUNTER — Encounter (HOSPITAL_COMMUNITY)
Admission: RE | Admit: 2018-05-06 | Discharge: 2018-05-06 | Disposition: A | Discharge status: Home / Self Care | Payer: Medicaid Other | Source: Ambulatory Visit | Attending: Nephrology | Admitting: Nephrology

## 2018-05-06 VITALS — BP 114/74 | HR 86 | Temp 97.6°F | Resp 20

## 2018-05-06 DIAGNOSIS — Z940 Kidney transplant status: Secondary | ICD-10-CM | POA: Insufficient documentation

## 2018-05-06 DIAGNOSIS — N185 Chronic kidney disease, stage 5: Secondary | ICD-10-CM | POA: Insufficient documentation

## 2018-05-06 DIAGNOSIS — D631 Anemia in chronic kidney disease: Secondary | ICD-10-CM

## 2018-05-06 LAB — COMPREHENSIVE METABOLIC PANEL
ALT: 6 U/L (ref 0–44)
AST: 14 U/L — ABNORMAL LOW (ref 15–41)
Albumin: 3.8 g/dL (ref 3.5–5.0)
Alkaline Phosphatase: 105 U/L (ref 38–126)
Anion gap: 9 (ref 5–15)
BUN: 24 mg/dL — ABNORMAL HIGH (ref 6–20)
CO2: 21 mmol/L — ABNORMAL LOW (ref 22–32)
Calcium: 8.9 mg/dL (ref 8.9–10.3)
Chloride: 105 mmol/L (ref 98–111)
Creatinine, Ser: 2.17 mg/dL — ABNORMAL HIGH (ref 0.44–1.00)
GFR calc Af Amer: 30 mL/min — ABNORMAL LOW (ref >60)
GFR calc non Af Amer: 26 mL/min — ABNORMAL LOW (ref >60)
Glucose, Bld: 86 mg/dL (ref 70–99)
Potassium: 4.1 mmol/L (ref 3.5–5.1)
Sodium: 135 mmol/L (ref 135–145)
Total Bilirubin: 0.6 mg/dL (ref 0.3–1.2)
Total Protein: 6.2 g/dL — ABNORMAL LOW (ref 6.5–8.1)

## 2018-05-06 LAB — POCT HEMOGLOBIN-HEMACUE: Hemoglobin: 9.9 g/dL — ABNORMAL LOW (ref 12.0–15.0)

## 2018-05-06 MED ORDER — EPOETIN ALFA 40000 UNIT/ML IJ SOLN
INTRAMUSCULAR | Status: AC
  Administered 2018-05-06: 40000 [IU] via SUBCUTANEOUS
  Filled 2018-05-06: qty 1

## 2018-05-06 MED ORDER — EPOETIN ALFA 20000 UNIT/ML IJ SOLN: 40000.0000 [IU] | INTRAMUSCULAR | Status: DC

## 2018-05-19 ENCOUNTER — Other Ambulatory Visit: Payer: Self-pay

## 2018-05-20 ENCOUNTER — Other Ambulatory Visit: Payer: Self-pay

## 2018-05-20 ENCOUNTER — Ambulatory Visit (HOSPITAL_COMMUNITY)
Admission: RE | Admit: 2018-05-20 | Discharge: 2018-05-20 | Disposition: A | Discharge status: Home / Self Care | Payer: Medicaid Other | Source: Ambulatory Visit | Attending: Nephrology | Admitting: Nephrology

## 2018-05-20 VITALS — BP 100/76 | HR 72 | Temp 97.7°F | Resp 20

## 2018-05-20 DIAGNOSIS — Z940 Kidney transplant status: Secondary | ICD-10-CM

## 2018-05-20 DIAGNOSIS — N185 Chronic kidney disease, stage 5: Secondary | ICD-10-CM | POA: Diagnosis not present

## 2018-05-20 DIAGNOSIS — D631 Anemia in chronic kidney disease: Secondary | ICD-10-CM

## 2018-05-20 LAB — POCT HEMOGLOBIN-HEMACUE: Hemoglobin: 10.8 g/dL — ABNORMAL LOW (ref 12.0–15.0)

## 2018-05-20 MED ORDER — EPOETIN ALFA 40000 UNIT/ML IJ SOLN
INTRAMUSCULAR | Status: AC
  Administered 2018-05-20: 40000 [IU] via SUBCUTANEOUS
  Filled 2018-05-20: qty 1

## 2018-05-20 MED ORDER — EPOETIN ALFA 20000 UNIT/ML IJ SOLN: 40000.0000 [IU] | INTRAMUSCULAR | Status: DC

## 2018-05-21 LAB — PTH, INTACT AND CALCIUM
Calcium, Total (PTH): 9 mg/dL (ref 8.7–10.2)
PTH: 197 pg/mL — ABNORMAL HIGH (ref 15–65)

## 2018-05-27 DIAGNOSIS — I82411 Acute embolism and thrombosis of right femoral vein: Secondary | ICD-10-CM | POA: Diagnosis not present

## 2018-05-27 DIAGNOSIS — E559 Vitamin D deficiency, unspecified: Secondary | ICD-10-CM | POA: Diagnosis not present

## 2018-05-27 DIAGNOSIS — E7800 Pure hypercholesterolemia, unspecified: Secondary | ICD-10-CM | POA: Diagnosis not present

## 2018-05-27 DIAGNOSIS — F329 Major depressive disorder, single episode, unspecified: Secondary | ICD-10-CM | POA: Diagnosis not present

## 2018-06-03 ENCOUNTER — Other Ambulatory Visit: Payer: Self-pay

## 2018-06-03 ENCOUNTER — Ambulatory Visit (HOSPITAL_COMMUNITY)
Admission: RE | Admit: 2018-06-03 | Discharge: 2018-06-03 | Disposition: A | Discharge status: Home / Self Care | Payer: Medicaid Other | Source: Ambulatory Visit | Attending: Nephrology | Admitting: Nephrology

## 2018-06-03 VITALS — BP 94/61 | HR 63 | Temp 97.8°F | Resp 18

## 2018-06-03 DIAGNOSIS — Z940 Kidney transplant status: Secondary | ICD-10-CM

## 2018-06-03 DIAGNOSIS — N185 Chronic kidney disease, stage 5: Secondary | ICD-10-CM | POA: Diagnosis not present

## 2018-06-03 DIAGNOSIS — D631 Anemia in chronic kidney disease: Secondary | ICD-10-CM | POA: Diagnosis not present

## 2018-06-03 LAB — POCT HEMOGLOBIN-HEMACUE: Hemoglobin: 9.7 g/dL — ABNORMAL LOW (ref 12.0–15.0)

## 2018-06-03 MED ORDER — EPOETIN ALFA 40000 UNIT/ML IJ SOLN
INTRAMUSCULAR | Status: AC
  Administered 2018-06-03: 40000 [IU] via SUBCUTANEOUS
  Filled 2018-06-03: qty 1

## 2018-06-03 MED ORDER — EPOETIN ALFA 20000 UNIT/ML IJ SOLN: 40000.0000 [IU] | INTRAMUSCULAR | Status: DC

## 2018-06-16 ENCOUNTER — Other Ambulatory Visit: Payer: Self-pay

## 2018-06-17 ENCOUNTER — Ambulatory Visit (HOSPITAL_COMMUNITY)
Admission: RE | Admit: 2018-06-17 | Discharge: 2018-06-17 | Disposition: A | Discharge status: Home / Self Care | Payer: Medicaid Other | Source: Ambulatory Visit | Attending: Nephrology | Admitting: Nephrology

## 2018-06-17 VITALS — BP 109/63 | HR 65 | Temp 97.5°F | Resp 18

## 2018-06-17 DIAGNOSIS — D631 Anemia in chronic kidney disease: Secondary | ICD-10-CM | POA: Diagnosis not present

## 2018-06-17 DIAGNOSIS — N185 Chronic kidney disease, stage 5: Secondary | ICD-10-CM | POA: Diagnosis not present

## 2018-06-17 DIAGNOSIS — Z940 Kidney transplant status: Secondary | ICD-10-CM | POA: Insufficient documentation

## 2018-06-17 LAB — FERRITIN: Ferritin: 839 ng/mL — ABNORMAL HIGH (ref 11–307)

## 2018-06-17 LAB — IRON AND TIBC
Iron: 74 ug/dL (ref 28–170)
Saturation Ratios: 36 % — ABNORMAL HIGH (ref 10.4–31.8)
TIBC: 206 ug/dL — ABNORMAL LOW (ref 250–450)
UIBC: 132 ug/dL

## 2018-06-17 LAB — POCT HEMOGLOBIN-HEMACUE: Hemoglobin: 10.1 g/dL — ABNORMAL LOW (ref 12.0–15.0)

## 2018-06-17 LAB — PHOSPHORUS: Phosphorus: 3.2 mg/dL (ref 2.5–4.6)

## 2018-06-17 MED ORDER — EPOETIN ALFA 40000 UNIT/ML IJ SOLN
40000.0000 [IU] | INTRAMUSCULAR | Status: DC
  Administered 2018-06-17: 11:00:00 40000 [IU] via SUBCUTANEOUS

## 2018-06-17 MED ORDER — EPOETIN ALFA 40000 UNIT/ML IJ SOLN
INTRAMUSCULAR | Status: AC
  Filled 2018-06-17: qty 1

## 2018-06-28 DIAGNOSIS — I82412 Acute embolism and thrombosis of left femoral vein: Secondary | ICD-10-CM | POA: Diagnosis not present

## 2018-06-29 ENCOUNTER — Other Ambulatory Visit: Payer: Self-pay | Admitting: Internal Medicine

## 2018-06-29 DIAGNOSIS — Z79899 Other long term (current) drug therapy: Secondary | ICD-10-CM | POA: Diagnosis not present

## 2018-06-29 DIAGNOSIS — M7989 Other specified soft tissue disorders: Secondary | ICD-10-CM

## 2018-06-29 DIAGNOSIS — I82411 Acute embolism and thrombosis of right femoral vein: Secondary | ICD-10-CM | POA: Diagnosis not present

## 2018-06-29 DIAGNOSIS — E7800 Pure hypercholesterolemia, unspecified: Secondary | ICD-10-CM | POA: Diagnosis not present

## 2018-06-29 DIAGNOSIS — M109 Gout, unspecified: Secondary | ICD-10-CM | POA: Diagnosis not present

## 2018-06-29 DIAGNOSIS — R5383 Other fatigue: Secondary | ICD-10-CM | POA: Diagnosis not present

## 2018-06-29 DIAGNOSIS — Z940 Kidney transplant status: Secondary | ICD-10-CM | POA: Diagnosis not present

## 2018-06-30 ENCOUNTER — Other Ambulatory Visit: Payer: Self-pay

## 2018-07-01 ENCOUNTER — Ambulatory Visit (HOSPITAL_COMMUNITY)
Admission: RE | Admit: 2018-07-01 | Discharge: 2018-07-01 | Disposition: A | Discharge status: Home / Self Care | Payer: Medicaid Other | Source: Ambulatory Visit | Attending: Nephrology | Admitting: Nephrology

## 2018-07-01 VITALS — BP 95/71 | HR 70 | Temp 97.2°F | Resp 18

## 2018-07-01 DIAGNOSIS — Z940 Kidney transplant status: Secondary | ICD-10-CM

## 2018-07-01 DIAGNOSIS — D631 Anemia in chronic kidney disease: Secondary | ICD-10-CM

## 2018-07-01 DIAGNOSIS — N185 Chronic kidney disease, stage 5: Secondary | ICD-10-CM | POA: Insufficient documentation

## 2018-07-01 LAB — POCT HEMOGLOBIN-HEMACUE: Hemoglobin: 10.8 g/dL — ABNORMAL LOW (ref 12.0–15.0)

## 2018-07-01 MED ORDER — EPOETIN ALFA 40000 UNIT/ML IJ SOLN
INTRAMUSCULAR | Status: AC
  Administered 2018-07-01: 40000 [IU]
  Filled 2018-07-01: qty 1

## 2018-07-01 MED ORDER — EPOETIN ALFA 20000 UNIT/ML IJ SOLN: 40000.0000 [IU] | INTRAMUSCULAR | Status: DC

## 2018-07-07 DIAGNOSIS — H401122 Primary open-angle glaucoma, left eye, moderate stage: Secondary | ICD-10-CM | POA: Diagnosis not present

## 2018-07-12 ENCOUNTER — Other Ambulatory Visit: Payer: Self-pay | Admitting: Gastroenterology

## 2018-07-13 ENCOUNTER — Inpatient Hospital Stay: Admission: RE | Admit: 2018-07-13 | Payer: Medicaid Other | Source: Ambulatory Visit

## 2018-07-15 ENCOUNTER — Ambulatory Visit (HOSPITAL_COMMUNITY)
Admission: RE | Admit: 2018-07-15 | Discharge: 2018-07-15 | Disposition: A | Discharge status: Home / Self Care | Payer: Medicaid Other | Source: Ambulatory Visit | Attending: Nephrology | Admitting: Nephrology

## 2018-07-15 ENCOUNTER — Other Ambulatory Visit: Payer: Self-pay

## 2018-07-15 VITALS — BP 104/65 | HR 61 | Temp 97.3°F | Resp 18

## 2018-07-15 DIAGNOSIS — N185 Chronic kidney disease, stage 5: Secondary | ICD-10-CM | POA: Insufficient documentation

## 2018-07-15 DIAGNOSIS — D631 Anemia in chronic kidney disease: Secondary | ICD-10-CM | POA: Diagnosis not present

## 2018-07-15 DIAGNOSIS — Z940 Kidney transplant status: Secondary | ICD-10-CM

## 2018-07-15 LAB — COMPREHENSIVE METABOLIC PANEL
ALT: 10 U/L (ref 0–44)
AST: 14 U/L — ABNORMAL LOW (ref 15–41)
Albumin: 4 g/dL (ref 3.5–5.0)
Alkaline Phosphatase: 106 U/L (ref 38–126)
Anion gap: 10 (ref 5–15)
BUN: 34 mg/dL — ABNORMAL HIGH (ref 6–20)
CO2: 22 mmol/L (ref 22–32)
Calcium: 9.2 mg/dL (ref 8.9–10.3)
Chloride: 107 mmol/L (ref 98–111)
Creatinine, Ser: 2.21 mg/dL — ABNORMAL HIGH (ref 0.44–1.00)
GFR calc Af Amer: 29 mL/min — ABNORMAL LOW (ref >60)
GFR calc non Af Amer: 25 mL/min — ABNORMAL LOW (ref >60)
Glucose, Bld: 84 mg/dL (ref 70–99)
Potassium: 4.2 mmol/L (ref 3.5–5.1)
Sodium: 139 mmol/L (ref 135–145)
Total Bilirubin: 0.6 mg/dL (ref 0.3–1.2)
Total Protein: 6.3 g/dL — ABNORMAL LOW (ref 6.5–8.1)

## 2018-07-15 LAB — POCT HEMOGLOBIN-HEMACUE: Hemoglobin: 10.8 g/dL — ABNORMAL LOW (ref 12.0–15.0)

## 2018-07-15 MED ORDER — EPOETIN ALFA 40000 UNIT/ML IJ SOLN
INTRAMUSCULAR | Status: AC
  Administered 2018-07-15: 40000 [IU]
  Filled 2018-07-15: qty 1

## 2018-07-15 MED ORDER — EPOETIN ALFA 20000 UNIT/ML IJ SOLN: 40000.0000 [IU] | INTRAMUSCULAR | Status: DC

## 2018-07-25 ENCOUNTER — Ambulatory Visit
Admission: RE | Admit: 2018-07-25 | Discharge: 2018-07-25 | Disposition: A | Discharge status: Home / Self Care | Payer: Medicaid Other | Source: Ambulatory Visit | Attending: Internal Medicine | Admitting: Internal Medicine

## 2018-07-25 DIAGNOSIS — R600 Localized edema: Secondary | ICD-10-CM | POA: Diagnosis not present

## 2018-07-25 DIAGNOSIS — M7989 Other specified soft tissue disorders: Secondary | ICD-10-CM

## 2018-07-29 ENCOUNTER — Other Ambulatory Visit: Payer: Self-pay

## 2018-07-29 ENCOUNTER — Encounter (HOSPITAL_COMMUNITY)
Admission: RE | Admit: 2018-07-29 | Discharge: 2018-07-29 | Disposition: A | Discharge status: Home / Self Care | Payer: Medicaid Other | Source: Ambulatory Visit | Attending: Nephrology | Admitting: Nephrology

## 2018-07-29 VITALS — BP 100/60 | HR 70 | Temp 98.8°F | Resp 18

## 2018-07-29 DIAGNOSIS — D631 Anemia in chronic kidney disease: Secondary | ICD-10-CM | POA: Diagnosis not present

## 2018-07-29 DIAGNOSIS — Z940 Kidney transplant status: Secondary | ICD-10-CM | POA: Diagnosis not present

## 2018-07-29 DIAGNOSIS — N185 Chronic kidney disease, stage 5: Secondary | ICD-10-CM | POA: Diagnosis not present

## 2018-07-29 LAB — POCT HEMOGLOBIN-HEMACUE: Hemoglobin: 10.8 g/dL — ABNORMAL LOW (ref 12.0–15.0)

## 2018-07-29 MED ORDER — EPOETIN ALFA 40000 UNIT/ML IJ SOLN
INTRAMUSCULAR | Status: AC
  Administered 2018-07-29: 40000 [IU] via SUBCUTANEOUS
  Filled 2018-07-29: qty 1

## 2018-07-29 MED ORDER — EPOETIN ALFA 20000 UNIT/ML IJ SOLN: 40000.0000 [IU] | INTRAMUSCULAR | Status: DC

## 2018-08-01 DIAGNOSIS — Z79899 Other long term (current) drug therapy: Secondary | ICD-10-CM | POA: Diagnosis not present

## 2018-08-01 DIAGNOSIS — E782 Mixed hyperlipidemia: Secondary | ICD-10-CM | POA: Diagnosis not present

## 2018-08-01 DIAGNOSIS — I82411 Acute embolism and thrombosis of right femoral vein: Secondary | ICD-10-CM | POA: Diagnosis not present

## 2018-08-01 DIAGNOSIS — Z7901 Long term (current) use of anticoagulants: Secondary | ICD-10-CM | POA: Diagnosis not present

## 2018-08-01 DIAGNOSIS — N189 Chronic kidney disease, unspecified: Secondary | ICD-10-CM | POA: Diagnosis not present

## 2018-08-12 ENCOUNTER — Ambulatory Visit (HOSPITAL_COMMUNITY)
Admission: RE | Admit: 2018-08-12 | Discharge: 2018-08-12 | Disposition: A | Discharge status: Home / Self Care | Payer: Medicaid Other | Source: Ambulatory Visit | Attending: Nephrology | Admitting: Nephrology

## 2018-08-12 ENCOUNTER — Other Ambulatory Visit: Payer: Self-pay

## 2018-08-12 VITALS — BP 102/66 | HR 67 | Temp 97.5°F | Resp 18

## 2018-08-12 DIAGNOSIS — Z940 Kidney transplant status: Secondary | ICD-10-CM

## 2018-08-12 DIAGNOSIS — D631 Anemia in chronic kidney disease: Secondary | ICD-10-CM

## 2018-08-12 DIAGNOSIS — N185 Chronic kidney disease, stage 5: Secondary | ICD-10-CM | POA: Diagnosis not present

## 2018-08-12 LAB — PHOSPHORUS: Phosphorus: 2.6 mg/dL (ref 2.5–4.6)

## 2018-08-12 LAB — POCT HEMOGLOBIN-HEMACUE: Hemoglobin: 10.9 g/dL — ABNORMAL LOW (ref 12.0–15.0)

## 2018-08-12 LAB — IRON AND TIBC
Iron: 60 ug/dL (ref 28–170)
Saturation Ratios: 33 % — ABNORMAL HIGH (ref 10.4–31.8)
TIBC: 179 ug/dL — ABNORMAL LOW (ref 250–450)
UIBC: 119 ug/dL

## 2018-08-12 LAB — FERRITIN: Ferritin: 1229 ng/mL — ABNORMAL HIGH (ref 11–307)

## 2018-08-12 MED ORDER — EPOETIN ALFA 40000 UNIT/ML IJ SOLN
INTRAMUSCULAR | Status: AC
  Administered 2018-08-12: 11:00:00 40000 [IU] via SUBCUTANEOUS
  Filled 2018-08-12: qty 1

## 2018-08-12 MED ORDER — EPOETIN ALFA 20000 UNIT/ML IJ SOLN: 40000.0000 [IU] | INTRAMUSCULAR | Status: DC

## 2018-08-22 ENCOUNTER — Other Ambulatory Visit: Payer: Self-pay | Admitting: Nephrology

## 2018-08-22 DIAGNOSIS — Z1231 Encounter for screening mammogram for malignant neoplasm of breast: Secondary | ICD-10-CM

## 2018-08-25 ENCOUNTER — Other Ambulatory Visit: Payer: Self-pay | Admitting: Nephrology

## 2018-08-25 ENCOUNTER — Other Ambulatory Visit: Payer: Self-pay

## 2018-08-25 ENCOUNTER — Encounter (HOSPITAL_COMMUNITY): Payer: Self-pay | Admitting: Emergency Medicine

## 2018-08-25 ENCOUNTER — Emergency Department (HOSPITAL_COMMUNITY): Payer: Medicaid Other

## 2018-08-25 ENCOUNTER — Emergency Department (HOSPITAL_BASED_OUTPATIENT_CLINIC_OR_DEPARTMENT_OTHER): Payer: Medicaid Other

## 2018-08-25 ENCOUNTER — Emergency Department (HOSPITAL_COMMUNITY)
Admission: EM | Admit: 2018-08-25 | Discharge: 2018-08-25 | Disposition: A | Discharge status: Home / Self Care | Payer: Medicaid Other | Attending: Emergency Medicine | Admitting: Emergency Medicine

## 2018-08-25 DIAGNOSIS — R791 Abnormal coagulation profile: Secondary | ICD-10-CM | POA: Diagnosis not present

## 2018-08-25 DIAGNOSIS — R319 Hematuria, unspecified: Secondary | ICD-10-CM | POA: Insufficient documentation

## 2018-08-25 DIAGNOSIS — F17210 Nicotine dependence, cigarettes, uncomplicated: Secondary | ICD-10-CM | POA: Insufficient documentation

## 2018-08-25 DIAGNOSIS — Z940 Kidney transplant status: Secondary | ICD-10-CM | POA: Insufficient documentation

## 2018-08-25 DIAGNOSIS — F1210 Cannabis abuse, uncomplicated: Secondary | ICD-10-CM | POA: Insufficient documentation

## 2018-08-25 DIAGNOSIS — M79671 Pain in right foot: Secondary | ICD-10-CM | POA: Diagnosis not present

## 2018-08-25 DIAGNOSIS — Z7901 Long term (current) use of anticoagulants: Secondary | ICD-10-CM | POA: Insufficient documentation

## 2018-08-25 DIAGNOSIS — R609 Edema, unspecified: Secondary | ICD-10-CM | POA: Insufficient documentation

## 2018-08-25 DIAGNOSIS — Z8673 Personal history of transient ischemic attack (TIA), and cerebral infarction without residual deficits: Secondary | ICD-10-CM | POA: Insufficient documentation

## 2018-08-25 DIAGNOSIS — R140 Abdominal distension (gaseous): Secondary | ICD-10-CM | POA: Diagnosis not present

## 2018-08-25 DIAGNOSIS — N390 Urinary tract infection, site not specified: Secondary | ICD-10-CM | POA: Insufficient documentation

## 2018-08-25 DIAGNOSIS — M79672 Pain in left foot: Secondary | ICD-10-CM | POA: Insufficient documentation

## 2018-08-25 DIAGNOSIS — R600 Localized edema: Secondary | ICD-10-CM | POA: Diagnosis not present

## 2018-08-25 DIAGNOSIS — N186 End stage renal disease: Secondary | ICD-10-CM | POA: Diagnosis not present

## 2018-08-25 DIAGNOSIS — R222 Localized swelling, mass and lump, trunk: Secondary | ICD-10-CM | POA: Diagnosis not present

## 2018-08-25 DIAGNOSIS — R2243 Localized swelling, mass and lump, lower limb, bilateral: Secondary | ICD-10-CM | POA: Diagnosis present

## 2018-08-25 LAB — URINALYSIS, ROUTINE W REFLEX MICROSCOPIC
Bilirubin Urine: NEGATIVE
Glucose, UA: NEGATIVE mg/dL
Ketones, ur: NEGATIVE mg/dL
Nitrite: NEGATIVE
Protein, ur: NEGATIVE mg/dL
Specific Gravity, Urine: 1.008 (ref 1.005–1.030)
pH: 7 (ref 5.0–8.0)

## 2018-08-25 LAB — PROTIME-INR
INR: 3.9 — ABNORMAL HIGH (ref 0.8–1.2)
Prothrombin Time: 37.7 seconds — ABNORMAL HIGH (ref 11.4–15.2)

## 2018-08-25 LAB — HEPATIC FUNCTION PANEL
ALT: 9 U/L (ref 0–44)
AST: 31 U/L (ref 15–41)
Albumin: 3.7 g/dL (ref 3.5–5.0)
Alkaline Phosphatase: 88 U/L (ref 38–126)
Bilirubin, Direct: 0.3 mg/dL — ABNORMAL HIGH (ref 0.0–0.2)
Indirect Bilirubin: 0.5 mg/dL (ref 0.3–0.9)
Total Bilirubin: 0.8 mg/dL (ref 0.3–1.2)
Total Protein: 6.1 g/dL — ABNORMAL LOW (ref 6.5–8.1)

## 2018-08-25 LAB — LIPASE, BLOOD: Lipase: 37 U/L (ref 11–51)

## 2018-08-25 LAB — BASIC METABOLIC PANEL
Anion gap: 9 (ref 5–15)
BUN: 24 mg/dL — ABNORMAL HIGH (ref 6–20)
CO2: 22 mmol/L (ref 22–32)
Calcium: 8.9 mg/dL (ref 8.9–10.3)
Chloride: 107 mmol/L (ref 98–111)
Creatinine, Ser: 2.2 mg/dL — ABNORMAL HIGH (ref 0.44–1.00)
GFR calc Af Amer: 29 mL/min — ABNORMAL LOW (ref >60)
GFR calc non Af Amer: 25 mL/min — ABNORMAL LOW (ref >60)
Glucose, Bld: 87 mg/dL (ref 70–99)
Potassium: 4.8 mmol/L (ref 3.5–5.1)
Sodium: 138 mmol/L (ref 135–145)

## 2018-08-25 LAB — CBC
HCT: 30.9 % — ABNORMAL LOW (ref 36.0–46.0)
Hemoglobin: 9.7 g/dL — ABNORMAL LOW (ref 12.0–15.0)
MCH: 31.9 pg (ref 26.0–34.0)
MCHC: 31.4 g/dL (ref 30.0–36.0)
MCV: 101.6 fL — ABNORMAL HIGH (ref 80.0–100.0)
Platelets: 151 10*3/uL (ref 150–400)
RBC: 3.04 MIL/uL — ABNORMAL LOW (ref 3.87–5.11)
RDW: 22.1 % — ABNORMAL HIGH (ref 11.5–15.5)
WBC: 4 10*3/uL (ref 4.0–10.5)
nRBC: 1 % — ABNORMAL HIGH (ref 0.0–0.2)

## 2018-08-25 LAB — I-STAT BETA HCG BLOOD, ED (MC, WL, AP ONLY): I-stat hCG, quantitative: <5 m[IU]/mL (ref <5)

## 2018-08-25 LAB — BRAIN NATRIURETIC PEPTIDE: B Natriuretic Peptide: 104.8 pg/mL — ABNORMAL HIGH (ref 0.0–100.0)

## 2018-08-25 MED ORDER — FUROSEMIDE 10 MG/ML IJ SOLN
40.0000 mg | Freq: Once | INTRAMUSCULAR | Status: AC
  Administered 2018-08-25: 21:00:00 40 mg via INTRAVENOUS
  Filled 2018-08-25: qty 4

## 2018-08-25 MED ORDER — HYDROMORPHONE HCL 1 MG/ML IJ SOLN
1.0000 mg | Freq: Once | INTRAMUSCULAR | Status: AC
  Administered 2018-08-25: 1 mg via INTRAVENOUS
  Filled 2018-08-25: qty 1

## 2018-08-25 MED ORDER — CEPHALEXIN 250 MG PO CAPS: 250.0000 mg | ORAL_CAPSULE | Freq: Two times a day (BID) | ORAL | 0 refills | Status: AC

## 2018-08-25 MED ORDER — OXYCODONE-ACETAMINOPHEN 5-325 MG PO TABS
1.0000 | ORAL_TABLET | Freq: Once | ORAL | Status: AC
  Administered 2018-08-25: 1 via ORAL
  Filled 2018-08-25: qty 1

## 2018-08-25 MED ORDER — SODIUM CHLORIDE 0.9% FLUSH: 3.0000 mL | Freq: Once | INTRAVENOUS | Status: DC

## 2018-08-25 NOTE — ED Notes (Signed)
Administered 0.5 mg of Dilaudid (BP is stable, 103/66)

## 2018-08-25 NOTE — ED Notes (Signed)
Pt wanting to know if she take Coumadin tonight d/t INR 3.9 today.  Is due to have checked tomorrow.  Per Cortni PA, hold Coumadin tonight, call PCP in am for further instructions. Pt verbalized understanding.

## 2018-08-25 NOTE — Discharge Instructions (Signed)
You will need to increase your Lasix to twice daily.  You will need to contact your nephrologist tomorrow to schedule an appointment for follow-up.  You will also need to call your regular doctor tomorrow to schedule an appointment for follow-up in regards to your elevated INR.  Your urinalysis also showed that you might have a possible urinary tract infection.  You will be started on an antibiotic to cover for this. A culture was sent of your urine today to determine if there is any bacterial growth. If the results of the culture are positive and you require an antibiotic or a change of your prescribed antibiotic you will be contacted by the hospital. If the results are negative you will not be contacted.  Please return to the emergency department for any new or worsening symptoms in the meantime.

## 2018-08-25 NOTE — ED Triage Notes (Signed)
Onset overtime bilateral lower extremity swelling and recently bilateral feet pain. States pain is throbbing and history of DVT in the past.

## 2018-08-25 NOTE — Progress Notes (Signed)
LE venous duplex       has been completed. Preliminary results can be found under CV proc through chart review. Taimi Towe, BS, RDMS, RVT   

## 2018-08-25 NOTE — ED Provider Notes (Signed)
[facility] EMERGENCY DEPARTMENT Provider Note   CSN: [id number] Arrival date & time: 08/25/18  1406    History   Chief Complaint Chief Complaint  Patient presents with   Leg Pain   Leg Swelling    HPI Tina Watkins is a 53 y.o. female.     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  Patient denies any calf pain but states she has pain to the tops of her feet bilaterally.  She also feels like her abdomen is somewhat swollen.  She denies any nausea, vomiting, diarrhea.  Her last BM was this morning.  She denies any current chest pain or shortness of breath.  She states she did have some right-sided chest pain a few days ago that lasted for 1 day but has since resolved.  She reports she has been compliant with her Coumadin and immunosuppressants.  Denies any missed doses.  Past Medical History:  Diagnosis Date   Bacteremia due to Gram-negative bacteria 05/23/2011   Blind right eye    Chronic lower back pain    Complication of anesthesia    "woke up during OR; I have an extremely high tolerance" (12/11/2011) 1 procedure was graft; the other procedures were procedures that are typically done with sedation.   DDD (degenerative disc disease), cervical    Depression    Dysrhythmia    "tachycardia" (12/11/2011) new onset afib 10/15/14 EKG   E coli bacteremia 06/18/2011   ESRD (end stage renal disease) ([place]) 06/12/2011   Fibromyalgia    Gastroparesis    Gastroparesis    GERD (gastroesophageal reflux disease)    Glaucoma    right eye   Gout    H/O carpal tunnel syndrome    Headache(784.0)    "not often anymore" (12/11/2011)   Herpes genitalia 1994   History of blood transfusion    "more than a few times" (12/11/2011)   History of  stomach ulcers    Hypotension    Iron deficiency anemia    New onset a-fib ([place])    10/15/14 EKG   Osteopenia    Seizures ([place]) 1994   "post transplant; only have had that one" (12/11/2011)   Spinal stenosis in cervical region    Stroke [facility])     left basal ganglia lacunar infarct; Right frontal lobe lacunar infarct.   Stroke [facility]) ~ 1999; 2001   "briefly lost my vision; lost my right eye" (12/11/2011)    Patient Active Problem List   Diagnosis Date Noted   Tobacco abuse 11/20/2017   Right leg swelling 11/20/2017   CKD (chronic kidney disease), stage III ([place]) 11/20/2017   Right leg pain 11/20/2017   Stroke (cerebrum) ([place]) 11/20/2017   GERD (gastroesophageal reflux disease) 11/20/2017   Acute venous embolism and thrombosis of deep vessels of proximal lower extremity, right ([place]) 11/20/2017   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    Thrush, oral    AKI (acute kidney injury) ([place])    Multifocal pneumonia 08/09/2017   Chest pain 09/29/2015   Cervical stenosis of spinal canal 01/08/2015   Urinary tract infectious disease    Muscle spasms of neck 06/15/2014   Bleeding hemorrhoid 06/15/2014   Anemia in chronic kidney disease 06/15/2014  Acute on chronic renal failure ([place]) 06/13/2014   Pyelonephritis, acute 06/13/2014   Sepsis ([place]) 06/13/2014   History of kidney transplant    Chronic pain syndrome 04/09/2012   Dehydration, mild 04/09/2012   Gout attack 06/23/2011   Herpes infection 06/23/2011   Anxiety 06/23/2011   E coli bacteremia 06/18/2011   ESRD (end stage renal disease) ([place]) 06/12/2011   UTI (lower urinary tract infection) 06/12/2011   Bacteremia due to Gram-negative bacteria 05/23/2011   History of renal transplantation 05/22/2011   Septic shock(785.52) 05/22/2011   Acute on chronic kidney failure ([place]) 05/22/2011   Gastroparesis 04/24/2008   WEIGHT LOSS 08/24/2007   NAUSEA AND  VOMITING 08/24/2007    Past Surgical History:  Procedure Laterality Date   ANTERIOR CERVICAL DECOMP/DISCECTOMY FUSION N/A 01/08/2015   Procedure: Anterior Cervical Three-Four/Four-Five Decompression/Diskectomy/Fusion;  Surgeon: Leeroy Cha, MD;  Location: [facility] NEURO ORS;  Service: Neurosurgery;  Laterality: N/A;  C3-4 C4-5 Anterior cervical decompression/diskectomy/fusion   APPENDECTOMY  ~ 2004   CATARACT EXTRACTION     right eye   COLONOSCOPY     ENUCLEATION  2001   "right"   ESOPHAGOGASTRODUODENOSCOPY (EGD) WITH PROPOFOL N/A 04/21/2012   Procedure: ESOPHAGOGASTRODUODENOSCOPY (EGD) WITH PROPOFOL;  Surgeon: Milus Banister, MD;  Location: [facility] ENDOSCOPY;  Service: Endoscopy;  Laterality: N/A;   INSERTION OF DIALYSIS CATHETER  1988   "AV graft LUA & LFA; LUA worked for 1 day; LFA never worked"   [place]; 1999; 2005   "right"   [place]?; 1994; 2005     OB History   No obstetric history on file.      Home Medications    Prior to Admission medications   Medication Sig Start Date End Date Taking? Authorizing Provider  aspirin EC 81 MG tablet Take 81 mg by mouth daily.     [provider]  azaTHIOprine (IMURAN) 50 MG tablet Take 100 mg by mouth daily.     [provider]  cephALEXin (KEFLEX) 250 MG capsule Take 1 capsule (250 mg total) by mouth 2 (two) times daily for 7 days. 08/25/18 09/01/18  Renezmae Canlas S, PA-C  colchicine 0.6 MG tablet Take 0.6-1.2 mg by mouth See admin instructions. Take two tablets by mouth at onset, then take one tablet by mouth three times daily as needed for gout    [provider]  fluticasone (FLONASE) 50 MCG/ACT nasal spray Place 1 spray into both nostrils daily. 07/18/17   Caccavale, Sophia, PA-C  furosemide (LASIX) 40 MG tablet Take 40 mg by mouth at bedtime.  05/09/17   [provider]  latanoprost (XALATAN) 0.005 % ophthalmic  solution Place 1 drop into the left eye at bedtime. 03/30/17   [provider]  metoCLOPramide (REGLAN) 10 MG tablet Take 1 tablet (10 mg total) by mouth every 8 (eight) hours as needed for nausea. 04/04/18   Milus Banister, MD  metoCLOPramide (REGLAN) 5 MG tablet TAKE 1 TABLET BY MOUTH TWICE A DAY 07/12/18   Milus Banister, MD  nicotine (NICODERM CQ - DOSED IN MG/24 HOURS) 21 mg/24hr patch Place 1 patch (21 mg total) onto the skin daily. 11/21/17   Lady Deutscher, MD  omeprazole (PRILOSEC) 40 MG capsule Take 1 capsule (40 mg total) by mouth daily. Patient taking differently: Take 40 mg by mouth 2 (two) times daily.  08/17/17   Steve Rattler, DO  ondansetron (  ZOFRAN ODT) 4 MG disintegrating tablet Take 1 tablet (4 mg total) by mouth every 6 (six) hours as needed. 06/05/17   Ward, Delice Bison, DO  oxyCODONE-acetaminophen (PERCOCET) 10-325 MG tablet Take 1 tablet by mouth every 6 (six) hours as needed for pain.    [provider]  predniSONE (DELTASONE) 5 MG tablet Take 5 mg by mouth daily. 06/22/11   [name], MD  PROVENTIL HFA 108 [phone] Base) MCG/ACT inhaler Inhale 2 puffs into the lungs 4 (four) times daily as needed for shortness of breath. 08/04/17   [provider]  tacrolimus (PROGRAF) 1 MG capsule Take 2-3 mg by mouth See admin instructions. Take 3 capsules every morning and take 2 capsules every evening 06/22/11   [name], MD  warfarin (COUMADIN) 5 MG tablet Take 1 tablet (5 mg total) by mouth daily at 6 PM for 15 days. Patient not taking: Reported on 01/12/2018 11/20/17 01/12/18  Lady Deutscher, MD  warfarin (COUMADIN) 7.5 MG tablet Take 7.5 mg by mouth daily. 01/01/18   [provider]  zolpidem (AMBIEN) 10 MG tablet Take 10 mg by mouth at bedtime as needed for sleep.    [provider]    Family History Family History  Problem Relation Age of Onset   Glaucoma Mother    Multiple sclerosis Brother    Hypertension  Maternal Grandmother    Breast cancer Neg Hx     Social History Social History   Tobacco Use   Smoking status: Current Every Day Smoker    Packs/day: 0.20    Years: 28.00    Pack years: 5.60    Types: Cigarettes   Smokeless tobacco: Never Used   Tobacco comment: had quit 03/05/2011  Substance Use Topics   Alcohol use: Yes    Comment: rare   Drug use: Yes    Types: Marijuana     Allergies   Levofloxacin   Review of Systems Review of Systems  Constitutional: Negative for fever.  HENT: Negative for ear pain and sore throat.   Eyes: Negative for visual disturbance.  Respiratory: Negative for cough and shortness of breath.   Cardiovascular: Positive for chest pain (resolved) and leg swelling.  Gastrointestinal: Positive for abdominal distention. Negative for abdominal pain, constipation, diarrhea, nausea and vomiting.  Genitourinary: Negative for dysuria and hematuria.  Musculoskeletal: Negative for back pain.  Skin: Negative for rash.  Neurological: Negative for headaches.  All other systems reviewed and are negative.   Physical Exam Updated Vital Signs BP 122/60    Pulse (!) 59    Temp 97.9 F (36.6 C) (Oral)    Resp 17    Ht 4\' 11"  (1.499 m)    Wt 52 kg    SpO2 98%    BMI 23.15 kg/m   Physical Exam Vitals signs and nursing note reviewed.  Constitutional:      General: She is not in acute distress.    Appearance: She is well-developed. She is not ill-appearing or toxic-appearing.  HENT:     Head: Normocephalic and atraumatic.  Eyes:     Conjunctiva/sclera: Conjunctivae normal.  Neck:     Musculoskeletal: Neck supple.  Cardiovascular:     Rate and Rhythm: Normal rate and regular rhythm.     Pulses: Normal pulses.     Heart sounds: Normal heart sounds. No murmur.  Pulmonary:     Effort: Pulmonary effort is normal. No respiratory distress.     Breath sounds: Normal breath sounds.  No wheezing, rhonchi or rales.  Abdominal:     General: Bowel sounds are  normal. There is distension (mild).     Palpations: Abdomen is soft.     Tenderness: There is no abdominal tenderness. There is no guarding or rebound.     Comments: Scars noted from prior renal transplant. Minimal TTP to the right side of the abdomen. No rebound tenderness. No guarding. No erythema or warmth to the skin. No edema to the abdomen.   Musculoskeletal:     Comments: 1+ BLE edema. No calf TTP bilaterally. Compartments are soft bilat. TTP to the dorsums of the bilat feet. No erythema/ecchymosis. DP pulses are dopplerable bilaterally. Chronic skin changes to the BLE which patient states are unchanged.   Skin:    General: Skin is warm and dry.  Neurological:     Mental Status: She is alert.    ED Treatments / Results  Labs (all labs ordered are listed, but only abnormal results are displayed) Labs Reviewed  BASIC METABOLIC PANEL - Abnormal; Notable for the following components:      Result Value   BUN 24 (*)    Creatinine, Ser 2.20 (*)    GFR calc non Af Amer 25 (*)    GFR calc Af Amer 29 (*)    All other components within normal limits  CBC - Abnormal; Notable for the following components:   RBC 3.04 (*)    Hemoglobin 9.7 (*)    HCT 30.9 (*)    MCV 101.6 (*)    RDW 22.1 (*)    nRBC 1.0 (*)    All other components within normal limits  BRAIN NATRIURETIC PEPTIDE - Abnormal; Notable for the following components:   B Natriuretic Peptide 104.8 (*)    All other components within normal limits  PROTIME-INR - Abnormal; Notable for the following components:   Prothrombin Time 37.7 (*)    INR 3.9 (*)    All other components within normal limits  HEPATIC FUNCTION PANEL - Abnormal; Notable for the following components:   Total Protein 6.1 (*)    Bilirubin, Direct 0.3 (*)    All other components within normal limits  URINALYSIS, ROUTINE W REFLEX MICROSCOPIC - Abnormal; Notable for the following components:   Hgb urine dipstick SMALL (*)    Leukocytes,Ua MODERATE (*)     Bacteria, UA RARE (*)    All other components within normal limits  URINE CULTURE  LIPASE, BLOOD  I-STAT BETA HCG BLOOD, ED ([facility], [facility], [facility] ONLY)    EKG None  Radiology Ct Abdomen Pelvis Wo Contrast  Result Date: 08/25/2018 CLINICAL DATA:  Subacute onset of worsening bilateral leg swelling. Abdominal distension. Personal history of renal transplant. EXAM: CT CHEST, ABDOMEN AND PELVIS WITHOUT CONTRAST TECHNIQUE: Multidetector CT imaging of the chest, abdomen and pelvis was performed following the standard protocol without IV contrast. COMPARISON:  CT of the abdomen and pelvis performed 08/09/2017, and CT of the chest performed 08/10/2017 FINDINGS: CT CHEST FINDINGS Cardiovascular: The heart is normal in size. Mild coronary artery calcifications are seen. A vascular stent is noted at the superior vena cava. Evaluation of the heart is limited without contrast. Mediastinum/Nodes: A prominent 1.6 cm right paratracheal node is noted, slightly better characterized than in 2019. No additional mediastinal lymphadenopathy is seen. No pericardial effusion is identified. The visualized portions of the thyroid gland are unremarkable. No axillary lymphadenopathy is appreciated. Lungs/Pleura: Mild scarring and atelectasis are noted at the right lung, reflecting interval resolution of  airspace opacities from 2019. The lungs are otherwise clear. No pleural effusion or pneumothorax is seen. No masses are identified. Musculoskeletal: No acute osseous abnormalities are identified. The visualized musculature is unremarkable in appearance. CT ABDOMEN PELVIS FINDINGS Hepatobiliary: The liver is unremarkable in appearance, with a small calcified granuloma at the inferior tip of the liver. The gallbladder is unremarkable in appearance. The common bile duct remains normal in caliber. Pancreas: The pancreas is within normal limits. Spleen: The spleen is unremarkable in appearance. Adrenals/Urinary Tract: The right adrenal gland is  grossly unremarkable. The patient is status post bilateral nephrectomy. A transplant kidney is noted at the right iliac fossa, grossly unremarkable in appearance, with mild perinephric stranding. No significant hydronephrosis is seen. Stomach/Bowel: The stomach is unremarkable in appearance. The small bowel is within normal limits. The appendix is not visualized; there is no evidence for appendicitis. The colon is unremarkable in appearance. Vascular/Lymphatic: Scattered calcification is seen along the abdominal aorta and its branches. The abdominal aorta is otherwise grossly unremarkable. The inferior vena cava is grossly unremarkable. No retroperitoneal lymphadenopathy is seen. No pelvic sidewall lymphadenopathy is identified. Reproductive: The bladder is mildly distended and grossly unremarkable. There is likely a fibroid at the uterine fundus. No suspicious adnexal masses are seen. Other: Soft tissue swelling is noted along the anterior chest abdominal wall, with underlying prominence of superficial venous vasculature. No significant ascites is seen. Musculoskeletal: No acute osseous abnormalities are identified. The visualized musculature is unremarkable in appearance. IMPRESSION: 1. No evidence of ascites. 2. Soft tissue swelling along the anterior chest and abdominal wall, reflecting mild superficial edema. 3. 1.6 cm right paratracheal node is slightly better characterized than in 2019. 4. Transplant kidney is grossly unremarkable in appearance. 5. Likely uterine fibroid noted. Electronically Signed   By: Garald Balding M.D.   On: 08/25/2018 19:18   Dg Chest 2 View  Result Date: 08/25/2018 CLINICAL DATA:  Bilateral lower extremity edema. Bilateral leg pain. EXAM: CHEST - 2 VIEW COMPARISON:  Chest x-rays dated 01/12/2018 and 08/09/2017 and chest CT dated 08/10/2017 FINDINGS: Overall heart size is within normal limits. Vascular stent in the superior vena cava. Chronic distention of the azygos vein. Fullness  of the left side of the superior mediastinum is demonstrated to be prominent veins on the prior CT scan. Chronic scarring in the right middle lobe. Lungs are otherwise clear. No effusions. No acute bone abnormality. IMPRESSION: No active cardiopulmonary disease. Electronically Signed   By: Lorriane Shire M.D.   On: 08/25/2018 15:24   Ct Chest Wo Contrast  Result Date: 08/25/2018 CLINICAL DATA:  Subacute onset of worsening bilateral leg swelling. Abdominal distension. Personal history of renal transplant. EXAM: CT CHEST, ABDOMEN AND PELVIS WITHOUT CONTRAST TECHNIQUE: Multidetector CT imaging of the chest, abdomen and pelvis was performed following the standard protocol without IV contrast. COMPARISON:  CT of the abdomen and pelvis performed 08/09/2017, and CT of the chest performed 08/10/2017 FINDINGS: CT CHEST FINDINGS Cardiovascular: The heart is normal in size. Mild coronary artery calcifications are seen. A vascular stent is noted at the superior vena cava. Evaluation of the heart is limited without contrast. Mediastinum/Nodes: A prominent 1.6 cm right paratracheal node is noted, slightly better characterized than in 2019. No additional mediastinal lymphadenopathy is seen. No pericardial effusion is identified. The visualized portions of the thyroid gland are unremarkable. No axillary lymphadenopathy is appreciated. Lungs/Pleura: Mild scarring and atelectasis are noted at the right lung, reflecting interval resolution of airspace opacities from 2019. The lungs  are otherwise clear. No pleural effusion or pneumothorax is seen. No masses are identified. Musculoskeletal: No acute osseous abnormalities are identified. The visualized musculature is unremarkable in appearance. CT ABDOMEN PELVIS FINDINGS Hepatobiliary: The liver is unremarkable in appearance, with a small calcified granuloma at the inferior tip of the liver. The gallbladder is unremarkable in appearance. The common bile duct remains normal in  caliber. Pancreas: The pancreas is within normal limits. Spleen: The spleen is unremarkable in appearance. Adrenals/Urinary Tract: The right adrenal gland is grossly unremarkable. The patient is status post bilateral nephrectomy. A transplant kidney is noted at the right iliac fossa, grossly unremarkable in appearance, with mild perinephric stranding. No significant hydronephrosis is seen. Stomach/Bowel: The stomach is unremarkable in appearance. The small bowel is within normal limits. The appendix is not visualized; there is no evidence for appendicitis. The colon is unremarkable in appearance. Vascular/Lymphatic: Scattered calcification is seen along the abdominal aorta and its branches. The abdominal aorta is otherwise grossly unremarkable. The inferior vena cava is grossly unremarkable. No retroperitoneal lymphadenopathy is seen. No pelvic sidewall lymphadenopathy is identified. Reproductive: The bladder is mildly distended and grossly unremarkable. There is likely a fibroid at the uterine fundus. No suspicious adnexal masses are seen. Other: Soft tissue swelling is noted along the anterior chest abdominal wall, with underlying prominence of superficial venous vasculature. No significant ascites is seen. Musculoskeletal: No acute osseous abnormalities are identified. The visualized musculature is unremarkable in appearance. IMPRESSION: 1. No evidence of ascites. 2. Soft tissue swelling along the anterior chest and abdominal wall, reflecting mild superficial edema. 3. 1.6 cm right paratracheal node is slightly better characterized than in 2019. 4. Transplant kidney is grossly unremarkable in appearance. 5. Likely uterine fibroid noted. Electronically Signed   By: Garald Balding M.D.   On: 08/25/2018 19:18   Vas [name] Lower Extremity Venous (dvt) ([facility] 7a-7p)  Result Date: 08/25/2018  Lower Venous Study Indications: Edema.  Risk Factors: Surgery Hx kidney transplant. Comparison Study: 11/20/17 DVT right CFV  Performing Technologist: June Leap RDMS, RVT  Examination Guidelines: A complete evaluation includes B-mode imaging, spectral Doppler, color Doppler, and power Doppler as needed of all accessible portions of each vessel. Bilateral testing is considered an integral part of a complete examination. Limited examinations for reoccurring indications may be performed as noted.  +---------+---------------+---------+-----------+----------+-------+  RIGHT     Compressibility Phasicity Spontaneity Properties Summary  +---------+---------------+---------+-----------+----------+-------+  CFV       Full            Yes       Yes                             +---------+---------------+---------+-----------+----------+-------+  SFJ       Full                                                      +---------+---------------+---------+-----------+----------+-------+  FV Prox   Full                                                      +---------+---------------+---------+-----------+----------+-------+  FV Mid    Full                                                      +---------+---------------+---------+-----------+----------+-------+  FV Distal Full                                                      +---------+---------------+---------+-----------+----------+-------+  PFV       Full                                                      +---------+---------------+---------+-----------+----------+-------+  POP       Full            Yes       Yes                             +---------+---------------+---------+-----------+----------+-------+  PTV       Full                                                      +---------+---------------+---------+-----------+----------+-------+  PERO      Full                                                      +---------+---------------+---------+-----------+----------+-------+  GSV       Full                                                       +---------+---------------+---------+-----------+----------+-------+   +---------+---------------+---------+-----------+----------+-------+  LEFT      Compressibility Phasicity Spontaneity Properties Summary  +---------+---------------+---------+-----------+----------+-------+  CFV       Full            Yes       Yes                             +---------+---------------+---------+-----------+----------+-------+  SFJ       Full                                                      +---------+---------------+---------+-----------+----------+-------+  FV Prox   Full                                                      +---------+---------------+---------+-----------+----------+-------+  FV Mid    Full                                                      +---------+---------------+---------+-----------+----------+-------+  FV Distal Full                                                      +---------+---------------+---------+-----------+----------+-------+  PFV       Full                                                      +---------+---------------+---------+-----------+----------+-------+  POP       Full            Yes       Yes                             +---------+---------------+---------+-----------+----------+-------+  PTV       Full                                                      +---------+---------------+---------+-----------+----------+-------+  PERO      Full                                                      +---------+---------------+---------+-----------+----------+-------+  GSV       Full                                                      +---------+---------------+---------+-----------+----------+-------+     Summary: Right: There is no evidence of deep vein thrombosis in the lower extremity.There is no evidence of superficial venous thrombosis. No cystic structure found in the popliteal fossa. Left: There is no evidence of deep vein thrombosis in the lower extremity.There is no evidence of  superficial venous thrombosis. No cystic structure found in the popliteal fossa.  *See table(s) above for measurements and observations. Electronically signed by Monica Martinez MD on 08/25/2018 at 5:49:42 PM.    Final     Procedures Procedures (including critical care time)  Medications Ordered in ED Medications  sodium chloride flush (NS) 0.9 % injection 3 mL (has no administration in time range)  HYDROmorphone (DILAUDID) injection 1 mg (1 mg Intravenous Given 08/25/18 1918)  furosemide (LASIX) injection 40 mg (40 mg Intravenous Given 08/25/18 2041)  oxyCODONE-acetaminophen (PERCOCET/ROXICET) 5-325 MG per tablet 1 tablet (1 tablet Oral Given 08/25/18 2041)     Initial Impression / Assessment and Plan / ED Course  I have reviewed the triage vital signs and the nursing notes.  Pertinent labs & imaging results that were available during my care of the patient were reviewed by me and considered in my medical decision making (see chart for details).   Final Clinical Impressions(s) / ED Diagnoses   Final diagnoses:  Peripheral edema  Elevated INR  Urinary tract infection with hematuria,  site unspecified   53 y/o female with h/o renal transplant and DVT (on coumadin) presenting to the ED c/o BLE swelling and bilat foot pain present for 1 week.   CBC with hgb 9.7, somewhat decreased from prior labs 2 weeks ago. Pt denies epistaxis, gum bleeding, hematuria, hematochezia, melena BMP with elevated Bun/Cr at 24 and 2.20 (baseline 2.1-2.3 per pt) Hepatic function WNL Lipase nonacute Beta hcg negative BNP nonacute PT-INR elevated at 3.9, pt informed. UA with hematuria, moderate leukocytes 0-5 RBCx and 11-20 WBCs with rare bacteria. Will collect culture.   Bilat LE [name] are negative for DVT.  CXR is negative for pneumonia, pneumothorax, or pulmonary edema.   CT chest/abd w/o contrast obtained and was without evidence of ascites. Soft tissue swelling noted  along the anterior chest and  abdominal wall, reflecting mild superficial edema. 1.6 cm right paratracheal node is slightly better characterized than in 2019. Transplant kidney is grossly unremarkable in appearance. Likely uterine fibroid noted.   7:57 PM Discussed case with Dr. Posey Pronto with nephrology who recommends giving a dose of 40mg  IV lasix in the ED and increasing the patients home dose of lasix 40QD to 40BID. He recommends that the patient f/u in the office next week with Dr. Marval Regal. He will send Dr. Marval Regal a message to alert him of pts visit to the ED.  Pt given pain medications, on reassessment she feels improved some improvement of pain. Discussed results of w/u and plan for increasing lasix. Also advised to hold coumadin tonight and call pcp tomorrow for further recommendations. Additionally, will send pt home with abx as she has some pyuria and bacteriuria. Will send for culture. Pt voices understanding of plan. Discussed reasons to return. All questions answered, pt stable for d/c.   ED Discharge Orders         Ordered    cephALEXin (KEFLEX) 250 MG capsule  2 times daily     08/25/18 2136           Rodney Booze, PA-C 08/25/18 2259    Charlesetta Shanks, MD 08/30/18 1004

## 2018-08-25 NOTE — ED Provider Notes (Signed)
Medical screening examination/treatment/procedure(s) were conducted as a shared visit with non-physician practitioner(s) and myself.  I personally evaluated the patient during the encounter.    Patient reports for several weeks now she has had increasing swelling in her legs.  It has gotten very tight and uncomfortable.  She particularly has uncomfortable areas on the top of the right foot and the lateral left thigh.  They have a throbbing and very tight quality.  Patient has history of renal transplant.  That is been essentially stable since 2011.  She reports she has had one episode of near rejection and was hospitalized with a similar episode of swelling about a year ago now.  She reports that she was diuresed with Lasix on an inpatient basis at that time.  She described a period of renal insufficiency that then recovered.  She has never been back on dialysis since her transplant.  She sees Dr. Arlyce Dice at [facility].  Patient is alert with clear mental status.  No respiratory distress.  Patient describes abdomen is being distended for her baseline.  Abdomen is soft without any tense ascites.  Mildly distended.  Bilateral peripheral edema is very firm and symmetric.  Soft tissues of the patient's lower legs has a fairly brawny and hard quality.  No wounds on the feet or the legs but there is skin thinning and variable serpiginous hyperpigmentation which she reports is very longstanding.  Dorsum of the feet bilaterally is puffy and tender on the right.  Soles of the feet are nontender.  I agree with plan of management.   Charlesetta Shanks, MD 08/25/18 [phone]

## 2018-08-26 ENCOUNTER — Ambulatory Visit (HOSPITAL_COMMUNITY)
Admission: RE | Admit: 2018-08-26 | Discharge: 2018-08-26 | Disposition: A | Discharge status: Home / Self Care | Payer: Medicaid Other | Source: Ambulatory Visit | Attending: Nephrology | Admitting: Nephrology

## 2018-08-26 VITALS — BP 115/63 | HR 65 | Temp 96.6°F | Resp 18

## 2018-08-26 DIAGNOSIS — N185 Chronic kidney disease, stage 5: Secondary | ICD-10-CM | POA: Diagnosis not present

## 2018-08-26 DIAGNOSIS — D631 Anemia in chronic kidney disease: Secondary | ICD-10-CM | POA: Diagnosis not present

## 2018-08-26 DIAGNOSIS — Z940 Kidney transplant status: Secondary | ICD-10-CM

## 2018-08-26 MED ORDER — EPOETIN ALFA 40000 UNIT/ML IJ SOLN
INTRAMUSCULAR | Status: AC
  Administered 2018-08-26: 40000 [IU] via SUBCUTANEOUS
  Filled 2018-08-26: qty 1

## 2018-08-26 MED ORDER — EPOETIN ALFA 20000 UNIT/ML IJ SOLN: 40000.0000 [IU] | INTRAMUSCULAR | Status: DC

## 2018-08-27 LAB — URINE CULTURE: Culture: NO GROWTH

## 2018-08-29 DIAGNOSIS — N179 Acute kidney failure, unspecified: Secondary | ICD-10-CM | POA: Diagnosis not present

## 2018-08-29 DIAGNOSIS — Z79899 Other long term (current) drug therapy: Secondary | ICD-10-CM | POA: Diagnosis not present

## 2018-08-31 DIAGNOSIS — E7800 Pure hypercholesterolemia, unspecified: Secondary | ICD-10-CM | POA: Diagnosis not present

## 2018-08-31 DIAGNOSIS — I82411 Acute embolism and thrombosis of right femoral vein: Secondary | ICD-10-CM | POA: Diagnosis not present

## 2018-08-31 DIAGNOSIS — Z79891 Long term (current) use of opiate analgesic: Secondary | ICD-10-CM | POA: Diagnosis not present

## 2018-08-31 DIAGNOSIS — E782 Mixed hyperlipidemia: Secondary | ICD-10-CM | POA: Diagnosis not present

## 2018-08-31 DIAGNOSIS — E559 Vitamin D deficiency, unspecified: Secondary | ICD-10-CM | POA: Diagnosis not present

## 2018-09-09 ENCOUNTER — Other Ambulatory Visit: Payer: Self-pay

## 2018-09-09 ENCOUNTER — Ambulatory Visit (HOSPITAL_COMMUNITY)
Admission: RE | Admit: 2018-09-09 | Discharge: 2018-09-09 | Disposition: A | Discharge status: Home / Self Care | Payer: Medicaid Other | Source: Ambulatory Visit | Attending: Nephrology | Admitting: Nephrology

## 2018-09-09 VITALS — BP 106/69 | HR 63 | Temp 97.3°F | Resp 18

## 2018-09-09 DIAGNOSIS — D631 Anemia in chronic kidney disease: Secondary | ICD-10-CM | POA: Insufficient documentation

## 2018-09-09 DIAGNOSIS — Z940 Kidney transplant status: Secondary | ICD-10-CM

## 2018-09-09 DIAGNOSIS — N185 Chronic kidney disease, stage 5: Secondary | ICD-10-CM | POA: Insufficient documentation

## 2018-09-09 LAB — COMPREHENSIVE METABOLIC PANEL
ALT: 24 U/L (ref 0–44)
AST: 30 U/L (ref 15–41)
Albumin: 3.9 g/dL (ref 3.5–5.0)
Alkaline Phosphatase: 107 U/L (ref 38–126)
Anion gap: 9 (ref 5–15)
BUN: 24 mg/dL — ABNORMAL HIGH (ref 6–20)
CO2: 20 mmol/L — ABNORMAL LOW (ref 22–32)
Calcium: 9.2 mg/dL (ref 8.9–10.3)
Chloride: 112 mmol/L — ABNORMAL HIGH (ref 98–111)
Creatinine, Ser: 1.95 mg/dL — ABNORMAL HIGH (ref 0.44–1.00)
GFR calc Af Amer: 34 mL/min — ABNORMAL LOW (ref >60)
GFR calc non Af Amer: 29 mL/min — ABNORMAL LOW (ref >60)
Glucose, Bld: 84 mg/dL (ref 70–99)
Potassium: 5.1 mmol/L (ref 3.5–5.1)
Sodium: 141 mmol/L (ref 135–145)
Total Bilirubin: 0.8 mg/dL (ref 0.3–1.2)
Total Protein: 6.3 g/dL — ABNORMAL LOW (ref 6.5–8.1)

## 2018-09-09 LAB — POCT HEMOGLOBIN-HEMACUE: Hemoglobin: 10.5 g/dL — ABNORMAL LOW (ref 12.0–15.0)

## 2018-09-09 MED ORDER — EPOETIN ALFA 40000 UNIT/ML IJ SOLN
INTRAMUSCULAR | Status: AC
  Filled 2018-09-09: qty 1

## 2018-09-09 MED ORDER — EPOETIN ALFA 20000 UNIT/ML IJ SOLN
40000.0000 [IU] | INTRAMUSCULAR | Status: DC
  Administered 2018-09-09: 40000 [IU] via SUBCUTANEOUS

## 2018-09-12 MED FILL — Epoetin Alfa Inj 40000 Unit/ML: INTRAMUSCULAR | Qty: 1 | Status: AC

## 2018-09-21 ENCOUNTER — Telehealth: Payer: Self-pay | Admitting: Gastroenterology

## 2018-09-21 NOTE — Telephone Encounter (Signed)
Dr Ardis Hughs the pt had her last colonoscopy at [facility].  She says you referred her for gastroparesis and it was done at the time of the work up for that.  She wants to know when she would be due for another colon.  I have copied and pasted the information from her visit at [facility];   "Colonoscopy 10/13/12: There was one 3 mm splenic flexure polyp removed with biopsy forceps and retrieved. The colon otherwise appeared normal. Retroflexion could not be performed in the rectum, forward views appeared normal. Pathology report showed a tubular adenoma."  Please advise

## 2018-09-21 NOTE — Telephone Encounter (Signed)
Recall colonoscopy should be 10/2019 (7 years from her subCM adenoma was removed)

## 2018-09-21 NOTE — Telephone Encounter (Signed)
Pt would like to know when she is due for colonoscopy.  Please advise.

## 2018-09-21 NOTE — Telephone Encounter (Signed)
The pt has been advised and recall in Epic for 10/2019

## 2018-09-23 ENCOUNTER — Ambulatory Visit (HOSPITAL_COMMUNITY)
Admission: RE | Admit: 2018-09-23 | Discharge: 2018-09-23 | Disposition: A | Discharge status: Home / Self Care | Payer: Medicaid Other | Source: Ambulatory Visit | Attending: Nephrology | Admitting: Nephrology

## 2018-09-23 ENCOUNTER — Other Ambulatory Visit: Payer: Self-pay

## 2018-09-23 ENCOUNTER — Other Ambulatory Visit: Payer: Self-pay | Admitting: Vascular Surgery

## 2018-09-23 VITALS — BP 116/58 | HR 63 | Temp 96.6°F | Resp 18

## 2018-09-23 DIAGNOSIS — M79604 Pain in right leg: Secondary | ICD-10-CM

## 2018-09-23 DIAGNOSIS — D631 Anemia in chronic kidney disease: Secondary | ICD-10-CM | POA: Insufficient documentation

## 2018-09-23 DIAGNOSIS — M7989 Other specified soft tissue disorders: Secondary | ICD-10-CM

## 2018-09-23 DIAGNOSIS — Z940 Kidney transplant status: Secondary | ICD-10-CM

## 2018-09-23 DIAGNOSIS — N185 Chronic kidney disease, stage 5: Secondary | ICD-10-CM | POA: Insufficient documentation

## 2018-09-23 DIAGNOSIS — I824Y1 Acute embolism and thrombosis of unspecified deep veins of right proximal lower extremity: Secondary | ICD-10-CM

## 2018-09-23 LAB — POCT HEMOGLOBIN-HEMACUE: Hemoglobin: 10.4 g/dL — ABNORMAL LOW (ref 12.0–15.0)

## 2018-09-23 MED ORDER — EPOETIN ALFA 40000 UNIT/ML IJ SOLN
40000.0000 [IU] | INTRAMUSCULAR | Status: DC
  Administered 2018-09-23: 11:00:00 40000 [IU] via SUBCUTANEOUS

## 2018-09-23 MED ORDER — EPOETIN ALFA 40000 UNIT/ML IJ SOLN
INTRAMUSCULAR | Status: AC
  Filled 2018-09-23: qty 1

## 2018-09-24 LAB — PTH, INTACT AND CALCIUM
Calcium, Total (PTH): 9.1 mg/dL (ref 8.7–10.2)
PTH: 140 pg/mL — ABNORMAL HIGH (ref 15–65)

## 2018-09-30 DIAGNOSIS — Z1159 Encounter for screening for other viral diseases: Secondary | ICD-10-CM | POA: Diagnosis not present

## 2018-09-30 DIAGNOSIS — E559 Vitamin D deficiency, unspecified: Secondary | ICD-10-CM | POA: Diagnosis not present

## 2018-09-30 DIAGNOSIS — R5383 Other fatigue: Secondary | ICD-10-CM | POA: Diagnosis not present

## 2018-09-30 DIAGNOSIS — Z79891 Long term (current) use of opiate analgesic: Secondary | ICD-10-CM | POA: Diagnosis not present

## 2018-09-30 DIAGNOSIS — Z0000 Encounter for general adult medical examination without abnormal findings: Secondary | ICD-10-CM | POA: Diagnosis not present

## 2018-09-30 DIAGNOSIS — E7800 Pure hypercholesterolemia, unspecified: Secondary | ICD-10-CM | POA: Diagnosis not present

## 2018-09-30 DIAGNOSIS — M109 Gout, unspecified: Secondary | ICD-10-CM | POA: Diagnosis not present

## 2018-09-30 DIAGNOSIS — Z114 Encounter for screening for human immunodeficiency virus [HIV]: Secondary | ICD-10-CM | POA: Diagnosis not present

## 2018-10-05 ENCOUNTER — Ambulatory Visit
Admission: RE | Admit: 2018-10-05 | Discharge: 2018-10-05 | Disposition: A | Discharge status: Home / Self Care | Payer: Medicaid Other | Source: Ambulatory Visit | Attending: Nephrology | Admitting: Nephrology

## 2018-10-05 ENCOUNTER — Other Ambulatory Visit: Payer: Self-pay

## 2018-10-05 DIAGNOSIS — Z1231 Encounter for screening mammogram for malignant neoplasm of breast: Secondary | ICD-10-CM

## 2018-10-06 DIAGNOSIS — D492 Neoplasm of unspecified behavior of bone, soft tissue, and skin: Secondary | ICD-10-CM | POA: Diagnosis not present

## 2018-10-06 DIAGNOSIS — M7989 Other specified soft tissue disorders: Secondary | ICD-10-CM | POA: Diagnosis not present

## 2018-10-07 ENCOUNTER — Encounter (HOSPITAL_COMMUNITY): Payer: Medicaid Other

## 2018-10-07 ENCOUNTER — Encounter (HOSPITAL_COMMUNITY)
Admission: RE | Admit: 2018-10-07 | Discharge: 2018-10-07 | Disposition: A | Discharge status: Home / Self Care | Payer: Medicaid Other | Source: Ambulatory Visit | Attending: Nephrology | Admitting: Nephrology

## 2018-10-07 ENCOUNTER — Other Ambulatory Visit: Payer: Self-pay

## 2018-10-07 VITALS — BP 95/54 | HR 63 | Temp 96.3°F | Resp 18

## 2018-10-07 DIAGNOSIS — D631 Anemia in chronic kidney disease: Secondary | ICD-10-CM

## 2018-10-07 DIAGNOSIS — Z940 Kidney transplant status: Secondary | ICD-10-CM | POA: Diagnosis not present

## 2018-10-07 DIAGNOSIS — N185 Chronic kidney disease, stage 5: Secondary | ICD-10-CM | POA: Insufficient documentation

## 2018-10-07 LAB — IRON AND TIBC
Iron: 55 ug/dL (ref 28–170)
Saturation Ratios: 27 % (ref 10.4–31.8)
TIBC: 202 ug/dL — ABNORMAL LOW (ref 250–450)
UIBC: 147 ug/dL

## 2018-10-07 LAB — FERRITIN: Ferritin: 1018 ng/mL — ABNORMAL HIGH (ref 11–307)

## 2018-10-07 LAB — PHOSPHORUS: Phosphorus: 3.4 mg/dL (ref 2.5–4.6)

## 2018-10-07 MED ORDER — EPOETIN ALFA 40000 UNIT/ML IJ SOLN
INTRAMUSCULAR | Status: AC
  Administered 2018-10-07: 40000 [IU] via SUBCUTANEOUS
  Filled 2018-10-07: qty 1

## 2018-10-07 MED ORDER — EPOETIN ALFA 20000 UNIT/ML IJ SOLN: 40000.0000 [IU] | INTRAMUSCULAR | Status: DC

## 2018-10-11 ENCOUNTER — Ambulatory Visit: Payer: Medicaid Other | Admitting: Neurology

## 2018-10-11 ENCOUNTER — Other Ambulatory Visit: Payer: Self-pay

## 2018-10-11 ENCOUNTER — Encounter: Payer: Self-pay | Admitting: Neurology

## 2018-10-11 VITALS — BP 103/58 | HR 73 | Ht 59.0 in | Wt 114.9 lb

## 2018-10-11 DIAGNOSIS — R202 Paresthesia of skin: Secondary | ICD-10-CM | POA: Insufficient documentation

## 2018-10-11 DIAGNOSIS — M542 Cervicalgia: Secondary | ICD-10-CM | POA: Diagnosis not present

## 2018-10-11 LAB — POCT HEMOGLOBIN-HEMACUE: Hemoglobin: 10.4 g/dL — ABNORMAL LOW (ref 12.0–15.0)

## 2018-10-11 NOTE — Progress Notes (Signed)
PATIENT: Tina Watkins DOB: 1965/06/11  Chief Complaint  Patient presents with   Pain    Reports an episode of extreme pain/swelling in bilateral legs and tops of feet.  She went to the ED on 08/25/2018.  Her symptoms completely resolved by 09/07/2018.  She feels gabapentin 100mg  BID played a role in this improvement.    PCP    Sandi Mariscal, MD     HISTORICAL  Tina Watkins is a 53 year old female, seen in request by her primary care physician Dr. Sandi Mariscal for evaluation of bilateral lower extremity and the foot pain, swelling, initial evaluation was on October 11, 2018.  I have reviewed and summarized the referring note from the referring physician.  She had a history of glomerulonephritis, went into kidney failure, was on dialysis, eventually received kidney transplant in July 2005, doing very well, she also suffered glaucoma, lost her right vision in 2002, has slow decline left vision, history of cervical C3-4-5 decompression fusion in December 2017, presented with significant neck pain radiating pain to bilateral upper extremity, but no gait abnormality.  She does have mild bilateral feet paresthesia at baseline.  She also has a history of right lower extremity DVT, is treated with Coumadin  She presented to emergency room on August 25, 2018 for increased swelling bilateral feet, lower extremity, excruciating pain, difficulty bearing weight, pulsating, painful to touch,  This Doppler study showed no evidence of DVT at bilateral lower extremity.  Arterial Doppler studies pending on November 02, 2018  CT abdomen, chest showed no evidence of ascites, soft tissue swelling along the anterior chest and abdominal wall reflecting mild superficial edema, 1.6 cm right paratracheal node slightly better characterized day in 2019, transplant kidney,  Laboratory evaluation showed INR 3.9, normal liver functional tests, creatinine 1.95, GFR of 34, hemoglobin of 10.5, ferritin of  1018,  She was given gabapentin 100 mg twice daily, her bilateral lower extremity swelling and pain has much improved proved in 1 week, she now only has mild bilateral feet paresthesia, she denies significant gait abnormality, no low back pain, there is slow recurrent neck pain,  REVIEW OF SYSTEMS: Full 14 system review of systems performed and notable only for as above All other review of systems were negative.  ALLERGIES: Allergies  Allergen Reactions   Levofloxacin Itching and Rash    HOME MEDICATIONS: Current Outpatient Medications  Medication Sig Dispense Refill   aspirin EC 81 MG tablet Take 81 mg by mouth daily.      azaTHIOprine (IMURAN) 50 MG tablet Take 100 mg by mouth daily.      colchicine 0.6 MG tablet Take 0.6-1.2 mg by mouth See admin instructions. Take two tablets by mouth at onset, then take one tablet by mouth three times daily as needed for gout     Darbepoetin Alfa (ARANESP, ALBUMIN FREE, IJ) Inject as directed every 14 (fourteen) days.     fluticasone (FLONASE) 50 MCG/ACT nasal spray Place 1 spray into both nostrils daily. 16 g 0   furosemide (LASIX) 40 MG tablet Take 40 mg by mouth at bedtime.   2   gabapentin (NEURONTIN) 100 MG capsule 1 (ONE) CAPSULE TWO TIMES DAILY, AS NEEDED, MAX DAILY DOSE  2 CAPSULE     latanoprost (XALATAN) 0.005 % ophthalmic solution Place 1 drop into the left eye at bedtime.  3   metoCLOPramide (REGLAN) 10 MG tablet Take 1 tablet (10 mg total) by mouth every 8 (eight) hours as needed for nausea.  40 tablet 0   metoCLOPramide (REGLAN) 5 MG tablet TAKE 1 TABLET BY MOUTH TWICE A DAY 180 tablet 0   nicotine (NICODERM CQ - DOSED IN MG/24 HOURS) 21 mg/24hr patch Place 1 patch (21 mg total) onto the skin daily. 28 patch 0   omeprazole (PRILOSEC) 40 MG capsule Take 1 capsule (40 mg total) by mouth daily. (Patient taking differently: Take 40 mg by mouth 2 (two) times daily. ) 40 capsule 0   ondansetron (ZOFRAN ODT) 4 MG disintegrating  tablet Take 1 tablet (4 mg total) by mouth every 6 (six) hours as needed. 20 tablet 0   oxyCODONE-acetaminophen (PERCOCET) 10-325 MG tablet Take 1 tablet by mouth every 6 (six) hours as needed for pain.     predniSONE (DELTASONE) 5 MG tablet Take 5 mg by mouth daily.     PROVENTIL HFA 108 (90 Base) MCG/ACT inhaler Inhale 2 puffs into the lungs 4 (four) times daily as needed for shortness of breath.  2   tacrolimus (PROGRAF) 1 MG capsule Take 2-3 mg by mouth See admin instructions. Take 3 capsules every morning and take 2 capsules every evening     warfarin (COUMADIN) 7.5 MG tablet Take 7.5 mg by mouth daily.  2   zolpidem (AMBIEN) 10 MG tablet Take 10 mg by mouth at bedtime as needed for sleep.     No current facility-administered medications for this visit.     PAST MEDICAL HISTORY: Past Medical History:  Diagnosis Date   Bacteremia due to Gram-negative bacteria 05/23/2011   Blind    right eye   Blind right eye    Chronic lower back pain    Complication of anesthesia    "woke up during OR; I have an extremely high tolerance" (12/11/2011) 1 procedure was graft; the other procedures were procedures that are typically done with sedation.   DDD (degenerative disc disease), cervical    Depression    Dysrhythmia    "tachycardia" (12/11/2011) new onset afib 10/15/14 EKG   E coli bacteremia 06/18/2011   ESRD (end stage renal disease) ([place]) 06/12/2011   Fibromyalgia    Gastroparesis    Gastroparesis    GERD (gastroesophageal reflux disease)    Glaucoma    right eye   Gout    H/O carpal tunnel syndrome    Headache(784.0)    "not often anymore" (12/11/2011)   Herpes genitalia 1994   History of blood transfusion    "more than a few times" (12/11/2011)   History of stomach ulcers    Hypotension    Iron deficiency anemia    New onset a-fib ([place])    10/15/14 EKG   Osteopenia    Seizures ([place]) 1994   "post transplant; only have had that one" (12/11/2011)    Spinal stenosis in cervical region    Stroke [facility])     left basal ganglia lacunar infarct; Right frontal lobe lacunar infarct.   Stroke [facility]) ~ 1999; 2001   "briefly lost my vision; lost my right eye" (12/11/2011)    PAST SURGICAL HISTORY: Past Surgical History:  Procedure Laterality Date   ANTERIOR CERVICAL DECOMP/DISCECTOMY FUSION N/A 01/08/2015   Procedure: Anterior Cervical Three-Four/Four-Five Decompression/Diskectomy/Fusion;  Surgeon: Leeroy Cha, MD;  Location: [facility];  Service: Neurosurgery;  Laterality: N/A;  C3-4 C4-5 Anterior cervical decompression/diskectomy/fusion   APPENDECTOMY  ~ 2004   CATARACT EXTRACTION     right eye   COLONOSCOPY     ENUCLEATION  2001   "right"   ESOPHAGOGASTRODUODENOSCOPY (  EGD) WITH PROPOFOL N/A 04/21/2012   Procedure: ESOPHAGOGASTRODUODENOSCOPY (EGD) WITH PROPOFOL;  Surgeon: Milus Banister, MD;  Location: [facility];  Service: Endoscopy;  Laterality: N/A;   INSERTION OF DIALYSIS CATHETER  1988   "AV graft LUA & LFA; LUA worked for 1 day; LFA never worked"   KIDNEY TRANSPLANT  1994; 1999; 2005   "right"   [place]?; 1994; 2005    FAMILY HISTORY: Family History  Problem Relation Age of Onset   Glaucoma Mother    Pancreatic cancer Father    Multiple sclerosis Brother    Hypertension Maternal Grandmother    Breast cancer Neg Hx     SOCIAL HISTORY: Social History   Socioeconomic History   Marital status: Single    Spouse name: Not on file   Number of children: 0   Years of education: some college   Highest education level: Not on file  Occupational History   Occupation: disabled    Fish farm manager: UNEMPLOYED  Social Designer, fashion/clothing strain: Not on file   Food insecurity    Worry: Not on file    Inability: Not on file   Transportation needs    Medical: Not on file    Non-medical: Not on file  Tobacco Use   Smoking status:  Current Every Day Smoker    Packs/day: 0.20    Years: 28.00    Pack years: 5.60    Types: Cigarettes   Smokeless tobacco: Never Used   Tobacco comment: had quit 03/05/2011  Substance and Sexual Activity   Alcohol use: Yes    Comment: rare   Drug use: Yes    Types: Marijuana    Comment: occasional use   Sexual activity: Yes  Lifestyle   Physical activity    Days per week: Not on file    Minutes per session: Not on file   Stress: Not on file  Relationships   Social connections    Talks on phone: Not on file    Gets together: Not on file    Attends religious service: Not on file    Active member of club or organization: Not on file    Attends meetings of clubs or organizations: Not on file    Relationship status: Not on file   Intimate partner violence    Fear of current or ex partner: Not on file    Emotionally abused: Not on file    Physically abused: Not on file    Forced sexual activity: Not on file  Other Topics Concern   Not on file  Social History Narrative   Right-handed.   Four cups caffeine per day.   Lives alone.     PHYSICAL EXAM   Vitals:   10/11/18 1355  BP: (!) 103/58  Pulse: 73  Weight: 114 lb 14.4 oz (52.1 kg)  Height: 4\' 11"  (1.499 m)    Not recorded      Body mass index is 23.21 kg/m.  PHYSICAL EXAMNIATION:  Gen: NAD, conversant, well nourised, well groomed                     Cardiovascular: Regular rate rhythm, no peripheral edema, warm, nontender. Eyes: Conjunctivae clear without exudates or hemorrhage Neck: Supple, no carotid bruits. Pulmonary: Clear to auscultation bilaterally   NEUROLOGICAL EXAM:  MENTAL STATUS: Speech:    Speech is normal; fluent and spontaneous with normal  comprehension.  Cognition:     Orientation to time, place and person     Normal recent and remote memory     Normal Attention span and concentration     Normal Language, naming, repeating,spontaneous speech     Fund of knowledge   CRANIAL  NERVES: CN II: Visual fields are full to confrontation.  Right eye blind, left pupil is round and briskly reactive to light. CN III, IV, VI: extraocular movement are normal. No ptosis. CN V: Facial sensation is intact to pinprick in all 3 divisions bilaterally. Corneal responses are intact.  CN VII: Face is symmetric with normal eye closure and smile. CN VIII: Hearing is normal to casual conversations CN IX, X: Palate elevates symmetrically. Phonation is normal. CN XI: Head turning and shoulder shrug are intact CN XII: Tongue is midline with normal movements and no atrophy.  MOTOR: There is no pronator drift of out-stretched arms. Muscle bulk and tone are normal. Muscle strength is normal.  REFLEXES: Reflexes are 1 and symmetric at the biceps, triceps, knees, and ankles. Plantar responses are flexor.  SENSORY: Mildly length dependent decreased to light touch, pinprick and vibratory sensation at toes  COORDINATION: Rapid alternating movements and fine finger movements are intact. There is no dysmetria on finger-to-nose and heel-knee-shin.    GAIT/STANCE: She needs push-up to get up from seated position, steady   DIAGNOSTIC DATA (LABS, IMAGING, TESTING) - I reviewed patient records, labs, notes, testing and imaging myself where available.   ASSESSMENT AND PLAN  Harold Mattes is a 53 y.o. female   Bilateral feet paresthesia  Most consistent with peripheral neuropathy, she does risk factor of longstanding chronic kidney disease  Proceed with EMG nerve conduction study Worsening neck pain,  History of cervical decompression in the past  Repeat MRI of cervical spine  Marcial Pacas, M.D. Ph.D.  [facility] [address], [place], [place] [address] Ph: [phone] Fax: [phone]  CC: Sandi Mariscal, MD

## 2018-10-12 ENCOUNTER — Telehealth: Payer: Self-pay | Admitting: Neurology

## 2018-10-12 NOTE — Telephone Encounter (Signed)
Medicaid order sent to GI. They will obtain the auth and reach out to the patient to schedule.  

## 2018-10-20 DIAGNOSIS — D492 Neoplasm of unspecified behavior of bone, soft tissue, and skin: Secondary | ICD-10-CM | POA: Diagnosis not present

## 2018-10-21 ENCOUNTER — Encounter (HOSPITAL_COMMUNITY)
Admission: RE | Admit: 2018-10-21 | Discharge: 2018-10-21 | Disposition: A | Discharge status: Home / Self Care | Payer: Medicaid Other | Source: Ambulatory Visit | Attending: Nephrology | Admitting: Nephrology

## 2018-10-21 ENCOUNTER — Other Ambulatory Visit: Payer: Self-pay

## 2018-10-21 VITALS — BP 106/71 | HR 68 | Temp 96.8°F | Resp 18

## 2018-10-21 DIAGNOSIS — D631 Anemia in chronic kidney disease: Secondary | ICD-10-CM | POA: Diagnosis not present

## 2018-10-21 DIAGNOSIS — N185 Chronic kidney disease, stage 5: Secondary | ICD-10-CM

## 2018-10-21 DIAGNOSIS — Z940 Kidney transplant status: Secondary | ICD-10-CM | POA: Diagnosis not present

## 2018-10-21 LAB — POCT HEMOGLOBIN-HEMACUE: Hemoglobin: 10.1 g/dL — ABNORMAL LOW (ref 12.0–15.0)

## 2018-10-21 MED ORDER — EPOETIN ALFA 40000 UNIT/ML IJ SOLN
INTRAMUSCULAR | Status: AC
  Administered 2018-10-21: 40000 [IU] via SUBCUTANEOUS
  Filled 2018-10-21: qty 1

## 2018-10-21 MED ORDER — EPOETIN ALFA 20000 UNIT/ML IJ SOLN: 40000.0000 [IU] | INTRAMUSCULAR | Status: DC

## 2018-10-30 ENCOUNTER — Other Ambulatory Visit: Payer: Medicaid Other

## 2018-10-31 ENCOUNTER — Other Ambulatory Visit: Payer: Self-pay | Admitting: Gastroenterology

## 2018-10-31 ENCOUNTER — Telehealth: Payer: Self-pay | Admitting: Gastroenterology

## 2018-10-31 DIAGNOSIS — Z79899 Other long term (current) drug therapy: Secondary | ICD-10-CM | POA: Diagnosis not present

## 2018-10-31 DIAGNOSIS — Z23 Encounter for immunization: Secondary | ICD-10-CM | POA: Diagnosis not present

## 2018-10-31 DIAGNOSIS — M109 Gout, unspecified: Secondary | ICD-10-CM | POA: Diagnosis not present

## 2018-10-31 MED ORDER — METOCLOPRAMIDE HCL 5 MG PO TABS: 5.0000 mg | ORAL_TABLET | Freq: Two times a day (BID) | ORAL | 0 refills | Status: DC

## 2018-10-31 NOTE — Telephone Encounter (Signed)
Sent prescription to pharmacy. Patient does not have a voicemail to leave message

## 2018-11-02 ENCOUNTER — Ambulatory Visit (HOSPITAL_COMMUNITY)
Admission: RE | Admit: 2018-11-02 | Discharge: 2018-11-02 | Disposition: A | Discharge status: Home / Self Care | Payer: Medicaid Other | Source: Ambulatory Visit | Attending: Vascular Surgery | Admitting: Vascular Surgery

## 2018-11-02 ENCOUNTER — Other Ambulatory Visit: Payer: Self-pay

## 2018-11-02 ENCOUNTER — Encounter: Payer: Self-pay | Admitting: Vascular Surgery

## 2018-11-02 ENCOUNTER — Ambulatory Visit (INDEPENDENT_AMBULATORY_CARE_PROVIDER_SITE_OTHER): Payer: Medicaid Other | Admitting: Vascular Surgery

## 2018-11-02 VITALS — BP 93/60 | HR 60 | Temp 97.7°F | Resp 20 | Ht 59.0 in | Wt 119.9 lb

## 2018-11-02 DIAGNOSIS — I824Y1 Acute embolism and thrombosis of unspecified deep veins of right proximal lower extremity: Secondary | ICD-10-CM | POA: Insufficient documentation

## 2018-11-02 DIAGNOSIS — M79604 Pain in right leg: Secondary | ICD-10-CM | POA: Insufficient documentation

## 2018-11-02 DIAGNOSIS — M7989 Other specified soft tissue disorders: Secondary | ICD-10-CM | POA: Diagnosis not present

## 2018-11-02 DIAGNOSIS — I739 Peripheral vascular disease, unspecified: Secondary | ICD-10-CM

## 2018-11-02 NOTE — Progress Notes (Signed)
REASON FOR CONSULT:    To evaluate for peripheral vascular disease.  The consult was requested by Ernest Haber PA.  ASSESSMENT & PLAN:   LEG PAIN: The etiology of her leg pain is not clear.  Based on her non-invasive assessment she does not have evidence of significant arterial insufficiency.  Likewise her symptoms are not consistent with claudication.  Her symptoms are relieved with elevation which would suggest some component of venous disease.  Although she has had venous duplex scans which show no evidence of DVT she may have some underlying venous insufficiency or venous reflux but has not had a formal venous test.  Regardless given that elevation helps her symptoms I have recommended leg elevation and we have discussed the proper positioning for this.  In addition I have written her a prescription for knee-high compression stockings with a gradient of 15 to 20 mmHg.  With respect to her pain in her feet I think she likely does have some underlying neuropathy.  I be happy to see her back at any time if she develops new vascular issues.   Deitra Mayo, MD, FACS Beeper [phone] Office: [phone]   HPI:   Tina Watkins is a pleasant 53 y.o. female, who is referred for evaluation for peripheral vascular disease.  I have reviewed the records from the referring office.  The patient was seen on 08/29/2018.  The patient has stage III chronic kidney disease.  This is secondary to postinfectious glomerulonephritis.  In July which she was complaining of some swelling in her legs and throbbing pain.  Work-up included ultrasound which was negative for DVT.  She was given some diuretics which help with the swelling but she was continuing to have some bilateral leg pain.  It was for this reason that she was sent for vascular consultation.  Of note it was felt that she may have some neuropathy and was started on a low dose of Neurontin.   This patient developed bilateral leg pain about  a year ago.  She describes pain in her anterior thighs bilaterally and also pain in her feet.  The symptoms occur with walking but also occur when she is simply standing.  Her symptoms are relieved with sitting.  I do not get any history of calf claudication.  She denies any history of rest pain.  She does have some burning pain in her feet and also paresthesias consistent with possible neuropathy.  She has a complicated past medical history.  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  She tells me she had a blood clot in her left leg a year ago and was temporarily on Coumadin but is no longer on Coumadin.  Her most recent duplex shows complete resolution of any clot in the left leg.  She tells me that she is had multiple strokes.  This was associated with left-sided weakness.  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  Her risk factors for peripheral vascular disease include tobacco use.  She smokes 1/2-1/3 of a pack per day.  She denies any history of diabetes, hypertension, hypercholesterolemia, or family history of premature cardiovascular disease.  Past Medical History:  Diagnosis Date  . Bacteremia due to Gram-negative bacteria 05/23/2011  . Blind    right eye  . Blind right eye   . Chronic lower back pain   .  Complication of anesthesia    "woke up during OR; I have an extremely high tolerance" (12/11/2011) 1 procedure was graft; the other procedures were procedures that are typically done with sedation.  . DDD (degenerative disc disease), cervical   . Depression   . Dysrhythmia    "tachycardia" (12/11/2011) new onset afib 10/15/14 EKG  . E coli bacteremia 06/18/2011  . ESRD (end stage renal disease) ([place]) 06/12/2011  . Fibromyalgia   . Gastroparesis   . Gastroparesis   . GERD (gastroesophageal reflux disease)   . Glaucoma    right eye  .  Gout   . H/O carpal tunnel syndrome   . Headache(784.0)    "not often anymore" (12/11/2011)  . Herpes genitalia 1994  . History of blood transfusion    "more than a few times" (12/11/2011)  . History of stomach ulcers   . Hypotension   . Iron deficiency anemia   . New onset a-fib ([place])    10/15/14 EKG  . Osteopenia   . Seizures ([place]) 1994   "post transplant; only have had that one" (12/11/2011)  . Spinal stenosis in cervical region   . Stroke [facility])     left basal ganglia lacunar infarct; Right frontal lobe lacunar infarct.  . Stroke [facility]) ~ 1999; 2001   "briefly lost my vision; lost my right eye" (12/11/2011)    Family History  Problem Relation Age of Onset  . Glaucoma Mother   . Pancreatic cancer Father   . Multiple sclerosis Brother   . Hypertension Maternal Grandmother   . Breast cancer Neg Hx     SOCIAL HISTORY: Social History   Socioeconomic History  . Marital status: Single    Spouse name: Not on file  . Number of children: 0  . Years of education: some college  . Highest education level: Not on file  Occupational History  . Occupation: disabled    Fish farm manager: UNEMPLOYED  Social Needs  . Financial resource strain: Not on file  . Food insecurity    Worry: Not on file    Inability: Not on file  . Transportation needs    Medical: Not on file    Non-medical: Not on file  Tobacco Use  . Smoking status: Current Every Day Smoker    Packs/day: 0.20    Years: 28.00    Pack years: 5.60    Types: Cigarettes  . Smokeless tobacco: Never Used  . Tobacco comment: had quit 03/05/2011  Substance and Sexual Activity  . Alcohol use: Yes    Comment: rare  . Drug use: Yes    Types: Marijuana    Comment: occasional use  . Sexual activity: Yes  Lifestyle  . Physical activity    Days per week: Not on file    Minutes per session: Not on file  . Stress: Not on file  Relationships  . Social Herbalist on phone: Not on file    Gets together: Not on file     Attends religious service: Not on file    Active member of club or organization: Not on file    Attends meetings of clubs or organizations: Not on file    Relationship status: Not on file  . Intimate partner violence    Fear of current or ex partner: Not on file    Emotionally abused: Not on file    Physically abused: Not on file    Forced sexual activity: Not on file  Other Topics Concern  .  Not on file  Social History Narrative   Right-handed.   Four cups caffeine per day.   Lives alone.    Allergies  Allergen Reactions  . Levofloxacin Itching and Rash    Current Outpatient Medications  Medication Sig Dispense Refill  . aspirin EC 81 MG tablet Take 81 mg by mouth daily.     [name] azaTHIOprine (IMURAN) 50 MG tablet Take 100 mg by mouth daily.     . colchicine 0.6 MG tablet Take 0.6-1.2 mg by mouth See admin instructions. Take two tablets by mouth at onset, then take one tablet by mouth three times daily as needed for gout    . Darbepoetin Alfa (ARANESP, ALBUMIN FREE, IJ) Inject as directed every 14 (fourteen) days.    . fluticasone (FLONASE) 50 MCG/ACT nasal spray Place 1 spray into both nostrils daily. 16 g 0  . furosemide (LASIX) 40 MG tablet Take 40 mg by mouth at bedtime.   2  . gabapentin (NEURONTIN) 100 MG capsule 1 (ONE) CAPSULE TWO TIMES DAILY, AS NEEDED, MAX DAILY DOSE  2 CAPSULE    . latanoprost (XALATAN) 0.005 % ophthalmic solution Place 1 drop into the left eye at bedtime.  3  . metoCLOPramide (REGLAN) 10 MG tablet Take 1 tablet (10 mg total) by mouth every 8 (eight) hours as needed for nausea. 40 tablet 0  . metoCLOPramide (REGLAN) 5 MG tablet Take 1 tablet (5 mg total) by mouth 2 (two) times daily. 180 tablet 0  . nicotine (NICODERM CQ - DOSED IN MG/24 HOURS) 21 mg/24hr patch Place 1 patch (21 mg total) onto the skin daily. 28 patch 0  . omeprazole (PRILOSEC) 40 MG capsule Take 1 capsule (40 mg total) by mouth daily. (Patient taking differently: Take 40 mg by mouth 2  (two) times daily. ) 40 capsule 0  . ondansetron (ZOFRAN ODT) 4 MG disintegrating tablet Take 1 tablet (4 mg total) by mouth every 6 (six) hours as needed. 20 tablet 0  . oxyCODONE-acetaminophen (PERCOCET) 10-325 MG tablet Take 1 tablet by mouth every 6 (six) hours as needed for pain.    . predniSONE (DELTASONE) 5 MG tablet Take 5 mg by mouth daily.    [name] PROVENTIL HFA 108 (90 Base) MCG/ACT inhaler Inhale 2 puffs into the lungs 4 (four) times daily as needed for shortness of breath.  2  . tacrolimus (PROGRAF) 1 MG capsule Take 2-3 mg by mouth See admin instructions. Take 3 capsules every morning and take 2 capsules every evening    . zolpidem (AMBIEN) 10 MG tablet Take 10 mg by mouth at bedtime as needed for sleep.     No current facility-administered medications for this visit.     REVIEW OF SYSTEMS:  [X]  denotes positive finding, [ ]  denotes negative finding Cardiac  Comments:  Chest pain or chest pressure:    Shortness of breath upon exertion:    Short of breath when lying flat:    Irregular heart rhythm:        Vascular    Pain in calf, thigh, or hip brought on by ambulation:    Pain in feet at night that wakes you up from your sleep:     Blood clot in your veins:    Leg swelling:         Pulmonary    Oxygen at home:    Productive cough:     Wheezing:         Neurologic    Sudden weakness in  arms or legs:     Sudden numbness in arms or legs:     Sudden onset of difficulty speaking or slurred speech:    Temporary loss of vision in one eye:     Problems with dizziness:         Gastrointestinal    Blood in stool:     Vomited blood:         Genitourinary    Burning when urinating:     Blood in urine:        Psychiatric    Major depression:         Hematologic    Bleeding problems:    Problems with blood clotting too easily:        Skin    Rashes or ulcers:        Constitutional    Fever or chills:     PHYSICAL EXAM:   Vitals:   11/02/18 1427  BP: 93/60   Pulse: 60  Resp: 20  Temp: 97.7 F (36.5 C)  SpO2: 100%  Weight: 119 lb 14.4 oz (54.4 kg)  Height: 4\' 11"  (1.499 m)    GENERAL: The patient is a well-nourished female, in no acute distress. The vital signs are documented above. CARDIAC: There is a regular rate and rhythm.  VASCULAR: I do not detect carotid bruits. She has palpable femoral pulses.  I cannot palpate popliteal or pedal pulses. She does have brisk biphasic Doppler signals in both feet in the dorsalis pedis and posterior tibial position. She has mild bilateral lower extremity swelling. PULMONARY: There is good air exchange bilaterally without wheezing or rales. ABDOMEN: Soft and non-tender with normal pitched bowel sounds.  MUSCULOSKELETAL: There are no major deformities or cyanosis. NEUROLOGIC: No focal weakness or paresthesias are detected. SKIN: There are no ulcers or rashes noted. PSYCHIATRIC: The patient has a normal affect.  DATA:    ARTERIAL DOPPLER STUDY: I have independently interpreted her arterial Doppler study today.  On the right side there is a triphasic dorsalis pedis and posterior tibial signal.  ABI is greater than 100%.  Toe pressure is 68 mmHg.  On the left side there is a triphasic dorsalis pedis and posterior tibial signal.  ABI is 100%.  Toe pressure is 83 mmHg.  LABS: GFR in July was 28.  VENOUS DUPLEX:.  She did have a venous duplex scan on 08/25/2018 which showed no evidence of DVT in either lower extremity.

## 2018-11-03 DIAGNOSIS — G629 Polyneuropathy, unspecified: Secondary | ICD-10-CM

## 2018-11-03 HISTORY — DX: Polyneuropathy, unspecified: G62.9

## 2018-11-04 ENCOUNTER — Other Ambulatory Visit: Payer: Self-pay

## 2018-11-04 ENCOUNTER — Ambulatory Visit (HOSPITAL_COMMUNITY)
Admission: RE | Admit: 2018-11-04 | Discharge: 2018-11-04 | Disposition: A | Discharge status: Home / Self Care | Payer: Medicaid Other | Source: Ambulatory Visit | Attending: Nephrology | Admitting: Nephrology

## 2018-11-04 VITALS — BP 111/80 | HR 60 | Temp 98.4°F | Resp 20

## 2018-11-04 DIAGNOSIS — Z940 Kidney transplant status: Secondary | ICD-10-CM

## 2018-11-04 DIAGNOSIS — D631 Anemia in chronic kidney disease: Secondary | ICD-10-CM

## 2018-11-04 DIAGNOSIS — N185 Chronic kidney disease, stage 5: Secondary | ICD-10-CM | POA: Diagnosis not present

## 2018-11-04 LAB — COMPREHENSIVE METABOLIC PANEL
ALT: 14 U/L (ref 0–44)
AST: 19 U/L (ref 15–41)
Albumin: 3.8 g/dL (ref 3.5–5.0)
Alkaline Phosphatase: 95 U/L (ref 38–126)
Anion gap: 10 (ref 5–15)
BUN: 27 mg/dL — ABNORMAL HIGH (ref 6–20)
CO2: 22 mmol/L (ref 22–32)
Calcium: 9.1 mg/dL (ref 8.9–10.3)
Chloride: 110 mmol/L (ref 98–111)
Creatinine, Ser: 2.01 mg/dL — ABNORMAL HIGH (ref 0.44–1.00)
GFR calc Af Amer: 32 mL/min — ABNORMAL LOW (ref >60)
GFR calc non Af Amer: 28 mL/min — ABNORMAL LOW (ref >60)
Glucose, Bld: 102 mg/dL — ABNORMAL HIGH (ref 70–99)
Potassium: 5 mmol/L (ref 3.5–5.1)
Sodium: 142 mmol/L (ref 135–145)
Total Bilirubin: 1 mg/dL (ref 0.3–1.2)
Total Protein: 6 g/dL — ABNORMAL LOW (ref 6.5–8.1)

## 2018-11-04 LAB — POCT HEMOGLOBIN-HEMACUE: Hemoglobin: 10.6 g/dL — ABNORMAL LOW (ref 12.0–15.0)

## 2018-11-04 MED ORDER — EPOETIN ALFA 40000 UNIT/ML IJ SOLN
INTRAMUSCULAR | Status: AC
  Administered 2018-11-04: 40000 [IU]
  Filled 2018-11-04: qty 1

## 2018-11-04 MED ORDER — EPOETIN ALFA 20000 UNIT/ML IJ SOLN: 40000.0000 [IU] | INTRAMUSCULAR | Status: DC

## 2018-11-07 DIAGNOSIS — H401122 Primary open-angle glaucoma, left eye, moderate stage: Secondary | ICD-10-CM | POA: Diagnosis not present

## 2018-11-08 DIAGNOSIS — M109 Gout, unspecified: Secondary | ICD-10-CM | POA: Diagnosis not present

## 2018-11-09 ENCOUNTER — Ambulatory Visit: Payer: Medicaid Other | Admitting: Neurology

## 2018-11-09 ENCOUNTER — Ambulatory Visit (INDEPENDENT_AMBULATORY_CARE_PROVIDER_SITE_OTHER): Payer: Medicaid Other | Admitting: Neurology

## 2018-11-09 ENCOUNTER — Other Ambulatory Visit: Payer: Self-pay

## 2018-11-09 DIAGNOSIS — R202 Paresthesia of skin: Secondary | ICD-10-CM

## 2018-11-09 DIAGNOSIS — Z0289 Encounter for other administrative examinations: Secondary | ICD-10-CM

## 2018-11-09 DIAGNOSIS — M542 Cervicalgia: Secondary | ICD-10-CM

## 2018-11-09 NOTE — Procedures (Signed)
Full Name: Tina Watkins Gender: Female MRN #: [id number] Date of Birth: [birth year]    Visit Date: 11/09/2018 10:40 Age: 53 Years 2 Months Old Examining Physician: Marcial Pacas, MD  Referring Physician: Marcial Pacas, MD History: 53 year old female with history of chronic kidney disease presented with bilateral feet paresthesia  Summary of the tests: Nerve conduction study: Left superficial peroneal sensory response was absent.  Right superficial peroneal sensory responses showed mildly prolonged peak latency with mildly decreased snap amplitude. Bilateral sural sensory responses showed moderately decreased snap amplitude.  Bilateral tibial, right peroneal motor responses showed mild to moderately decreased the C map amplitude, with moderate slow conduction velocity.  Left peroneal to EDB motor response also showed moderately decreased conduction velocity.  Electromyography: Selective needle examination of right lower extremity muscles showed mild chronic neuropathic changes at right abductor hallucis.  Conclusion: This is an abnormal study.  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  There is no evidence of lumbar radiculopathy.    ------------------------------- Marcial Pacas, M.D.Ph.D.  [facility] [place], [place] [address] Tel: [phone] Fax: [phone]        [facility]    Nerve / Sites Muscle Latency Ref. Amplitude Ref. Rel Amp Segments Distance Velocity Ref. Area    ms ms mV mV %  cm m/s m/s mVms  R Peroneal - EDB     Ankle EDB 4.1 ?6.5 2.2 ?2.0 100 Ankle - EDB 9   8.4     Fib head EDB 9.7  1.9  84.8 Fib head - Ankle 23 41 ?44 7.3     Pop fossa EDB 12.2  1.8  95.2 Pop fossa - Fib head 10 41 ?44 7.2         Pop fossa - Ankle      L Peroneal - EDB     Ankle EDB 5.2 ?6.5 0.2 ?2.0 100 Ankle - EDB 9   0.9     Fib head EDB 9.8  1.5  801 Fib head - Ankle 22 47 ?44 4.7     Pop fossa EDB 12.0  1.4  96 Pop fossa - Fib  head 10 45 ?44 4.4         Pop fossa - Ankle      R Tibial - AH     Ankle AH 2.7 ?5.8 8.5 ?4.0 100 Ankle - AH 9   16.8     Pop fossa AH 10.5  6.4  75.4 Pop fossa - Ankle 34 43 ?41 15.4  L Tibial - AH     Ankle AH 3.0 ?5.8 7.3 ?4.0 100 Ankle - AH 9   14.9     Pop fossa AH 10.8  5.6  77.1 Pop fossa - Ankle 34 43 ?41 12.3             SNC    Nerve / Sites Rec. Site Peak Lat Ref.  Amp Ref. Segments Distance    ms ms V V  cm  R Sural - Ankle (Calf)     Calf Ankle 3.7 ?4.4 7 ?6 Calf - Ankle 14  L Sural - Ankle (Calf)     Calf Ankle 3.6 ?4.4 6 ?6 Calf - Ankle 14  R Superficial peroneal - Ankle     Lat leg Ankle 4.3 ?4.4 4 ?6 Lat leg - Ankle 14  L Superficial peroneal - Ankle     Lat leg Ankle 4.1 ?4.4 3 ?6 Lat leg -  Ankle 14              F  Wave    Nerve F Lat Ref.   ms ms  R Tibial - AH 50.9 ?56.0  L Tibial - AH 50.9 ?56.0         EMG       EMG Summary Table    Spontaneous MUAP Recruitment  Muscle IA Fib PSW Fasc Other Amp Dur. Poly Pattern  R. Tibialis anterior Normal None None None _______ Normal Normal Normal Normal  R. Tibialis posterior Normal None None None _______ Normal Normal Normal Normal  R. Peroneus longus Normal None None None _______ Normal Normal Normal Normal  R. Gastrocnemius (Medial head) Normal None None None _______ Normal Normal Normal Normal  R. Vastus lateralis Normal None None None _______ Normal Normal Normal Normal  R. Abductor hallucis Increased None None None _______ Normal Increased Normal Reduced

## 2018-11-09 NOTE — Patient Instructions (Signed)

## 2018-11-10 DIAGNOSIS — D492 Neoplasm of unspecified behavior of bone, soft tissue, and skin: Secondary | ICD-10-CM | POA: Diagnosis not present

## 2018-11-18 ENCOUNTER — Encounter (HOSPITAL_COMMUNITY): Payer: Medicaid Other

## 2018-11-18 ENCOUNTER — Encounter (HOSPITAL_COMMUNITY)
Admission: RE | Admit: 2018-11-18 | Discharge: 2018-11-18 | Disposition: A | Discharge status: Home / Self Care | Payer: Medicaid Other | Source: Ambulatory Visit | Attending: Nephrology | Admitting: Nephrology

## 2018-11-18 ENCOUNTER — Other Ambulatory Visit: Payer: Self-pay

## 2018-11-18 VITALS — BP 106/62 | HR 68 | Temp 96.5°F | Resp 20

## 2018-11-18 DIAGNOSIS — Z940 Kidney transplant status: Secondary | ICD-10-CM | POA: Diagnosis not present

## 2018-11-18 DIAGNOSIS — D631 Anemia in chronic kidney disease: Secondary | ICD-10-CM

## 2018-11-18 DIAGNOSIS — N185 Chronic kidney disease, stage 5: Secondary | ICD-10-CM | POA: Diagnosis not present

## 2018-11-18 LAB — POCT HEMOGLOBIN-HEMACUE: Hemoglobin: 10.8 g/dL — ABNORMAL LOW (ref 12.0–15.0)

## 2018-11-18 MED ORDER — EPOETIN ALFA 20000 UNIT/ML IJ SOLN: 40000.0000 [IU] | INTRAMUSCULAR | Status: DC

## 2018-11-18 MED ORDER — EPOETIN ALFA 40000 UNIT/ML IJ SOLN
INTRAMUSCULAR | Status: AC
  Administered 2018-11-18: 40000 [IU] via SUBCUTANEOUS
  Filled 2018-11-18: qty 1

## 2018-11-30 DIAGNOSIS — Z940 Kidney transplant status: Secondary | ICD-10-CM | POA: Diagnosis not present

## 2018-11-30 DIAGNOSIS — Z79899 Other long term (current) drug therapy: Secondary | ICD-10-CM | POA: Diagnosis not present

## 2018-11-30 DIAGNOSIS — E7800 Pure hypercholesterolemia, unspecified: Secondary | ICD-10-CM | POA: Diagnosis not present

## 2018-12-01 ENCOUNTER — Other Ambulatory Visit (HOSPITAL_COMMUNITY): Payer: Self-pay | Admitting: *Deleted

## 2018-12-02 ENCOUNTER — Ambulatory Visit (HOSPITAL_COMMUNITY)
Admission: RE | Admit: 2018-12-02 | Discharge: 2018-12-02 | Disposition: A | Discharge status: Home / Self Care | Payer: Medicaid Other | Source: Ambulatory Visit | Attending: Nephrology | Admitting: Nephrology

## 2018-12-02 ENCOUNTER — Other Ambulatory Visit: Payer: Self-pay

## 2018-12-02 DIAGNOSIS — D631 Anemia in chronic kidney disease: Secondary | ICD-10-CM | POA: Insufficient documentation

## 2018-12-02 DIAGNOSIS — Z940 Kidney transplant status: Secondary | ICD-10-CM | POA: Diagnosis not present

## 2018-12-02 DIAGNOSIS — N185 Chronic kidney disease, stage 5: Secondary | ICD-10-CM | POA: Diagnosis not present

## 2018-12-02 LAB — POCT HEMOGLOBIN-HEMACUE: Hemoglobin: 10.4 g/dL — ABNORMAL LOW (ref 12.0–15.0)

## 2018-12-02 MED ORDER — EPOETIN ALFA 20000 UNIT/ML IJ SOLN
40000.0000 [IU] | INTRAMUSCULAR | Status: DC
  Administered 2018-12-02: 40000 [IU] via SUBCUTANEOUS

## 2018-12-02 MED ORDER — EPOETIN ALFA 40000 UNIT/ML IJ SOLN
INTRAMUSCULAR | Status: AC
  Filled 2018-12-02: qty 1

## 2018-12-04 DIAGNOSIS — E7800 Pure hypercholesterolemia, unspecified: Secondary | ICD-10-CM

## 2018-12-04 DIAGNOSIS — E559 Vitamin D deficiency, unspecified: Secondary | ICD-10-CM

## 2018-12-04 HISTORY — DX: Vitamin D deficiency, unspecified: E55.9

## 2018-12-04 HISTORY — DX: Pure hypercholesterolemia, unspecified: E78.00

## 2018-12-05 MED FILL — Epoetin Alfa Inj 40000 Unit/ML: INTRAMUSCULAR | Qty: 1 | Status: AC

## 2018-12-06 ENCOUNTER — Ambulatory Visit (INDEPENDENT_AMBULATORY_CARE_PROVIDER_SITE_OTHER): Payer: Medicaid Other | Admitting: Family Medicine

## 2018-12-06 ENCOUNTER — Other Ambulatory Visit: Payer: Self-pay

## 2018-12-06 ENCOUNTER — Encounter: Payer: Self-pay | Admitting: Family Medicine

## 2018-12-06 VITALS — BP 111/59 | HR 84 | Temp 98.1°F | Ht 59.0 in | Wt 124.6 lb

## 2018-12-06 DIAGNOSIS — Z0000 Encounter for general adult medical examination without abnormal findings: Secondary | ICD-10-CM | POA: Diagnosis not present

## 2018-12-06 DIAGNOSIS — G629 Polyneuropathy, unspecified: Secondary | ICD-10-CM | POA: Insufficient documentation

## 2018-12-06 DIAGNOSIS — Z09 Encounter for follow-up examination after completed treatment for conditions other than malignant neoplasm: Secondary | ICD-10-CM

## 2018-12-06 DIAGNOSIS — Z789 Other specified health status: Secondary | ICD-10-CM

## 2018-12-06 DIAGNOSIS — Z7689 Persons encountering health services in other specified circumstances: Secondary | ICD-10-CM

## 2018-12-06 DIAGNOSIS — N183 Chronic kidney disease, stage 3 unspecified: Secondary | ICD-10-CM | POA: Diagnosis not present

## 2018-12-06 DIAGNOSIS — M5030 Other cervical disc degeneration, unspecified cervical region: Secondary | ICD-10-CM | POA: Diagnosis not present

## 2018-12-06 DIAGNOSIS — M1A3720 Chronic gout due to renal impairment, left ankle and foot, without tophus (tophi): Secondary | ICD-10-CM

## 2018-12-06 DIAGNOSIS — Z716 Tobacco abuse counseling: Secondary | ICD-10-CM

## 2018-12-06 DIAGNOSIS — F17200 Nicotine dependence, unspecified, uncomplicated: Secondary | ICD-10-CM

## 2018-12-06 LAB — GLUCOSE, POCT (MANUAL RESULT ENTRY): POC Glucose: 116 mg/dl — AB (ref 70–99)

## 2018-12-06 LAB — POCT GLYCOSYLATED HEMOGLOBIN (HGB A1C): Hemoglobin A1C: 4.6 % (ref 4.0–5.6)

## 2018-12-06 IMAGING — MG DIGITAL SCREENING BILATERAL MAMMOGRAM WITH CAD
4 series · 4 of 4 positions shown · non-contrast
Comparison: Previous exam(s).

CLINICAL DATA: Screening.

EXAM:
DIGITAL SCREENING BILATERAL MAMMOGRAM WITH CAD

[L CC]
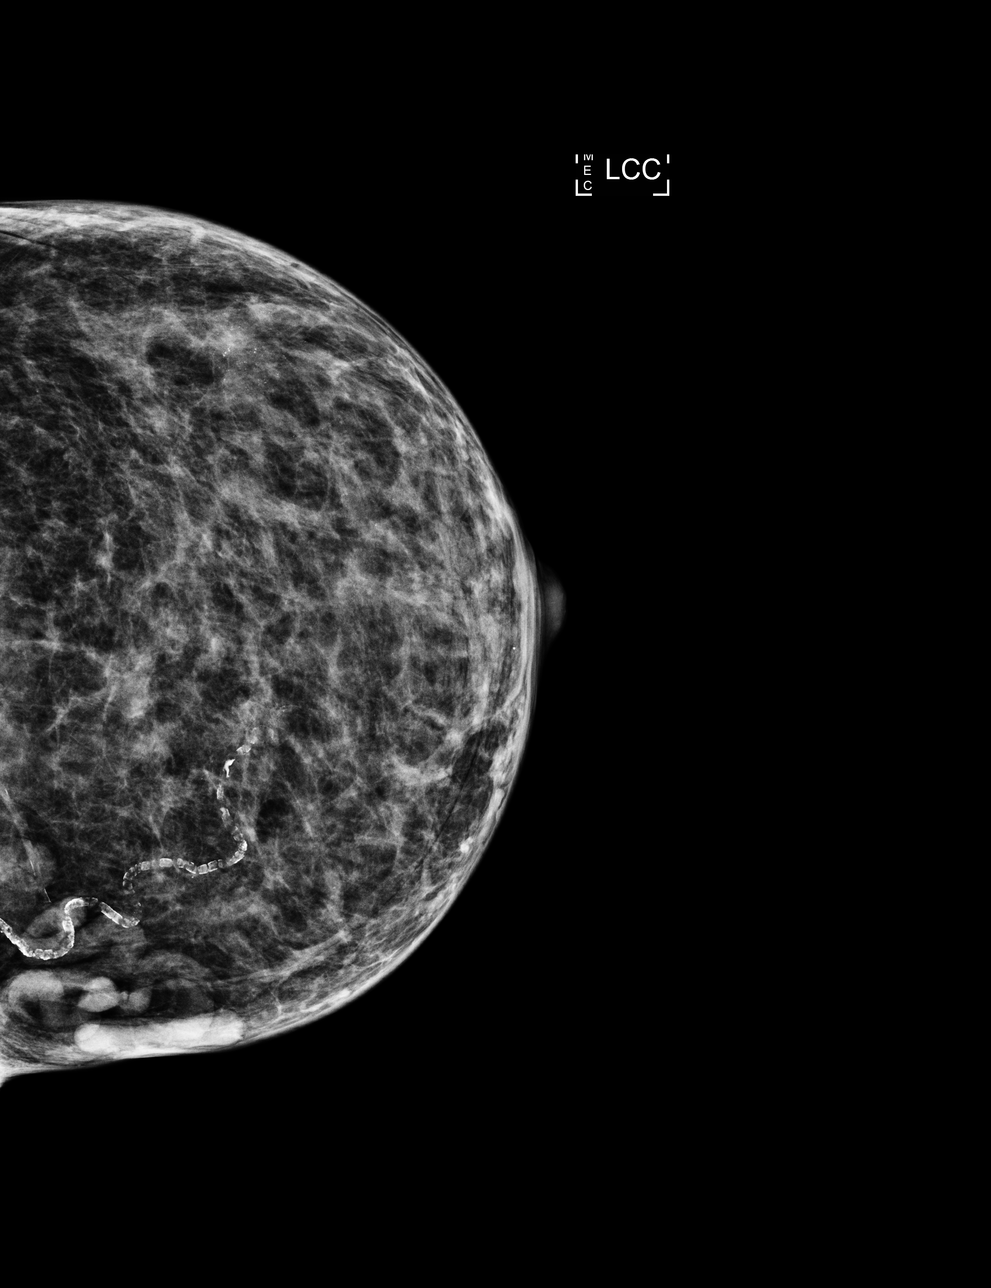

[L MLO]
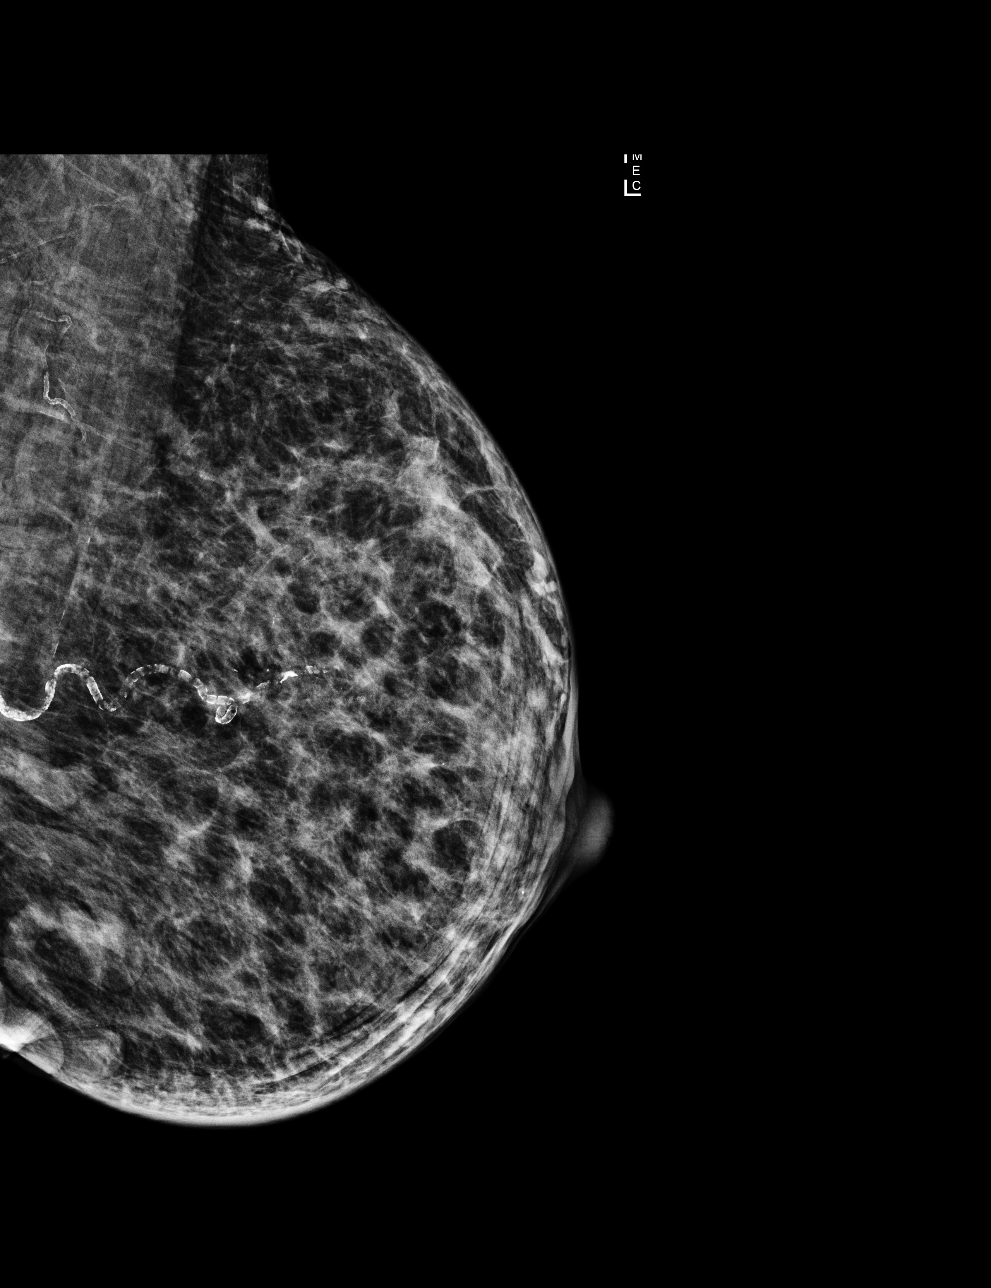

[R CC]
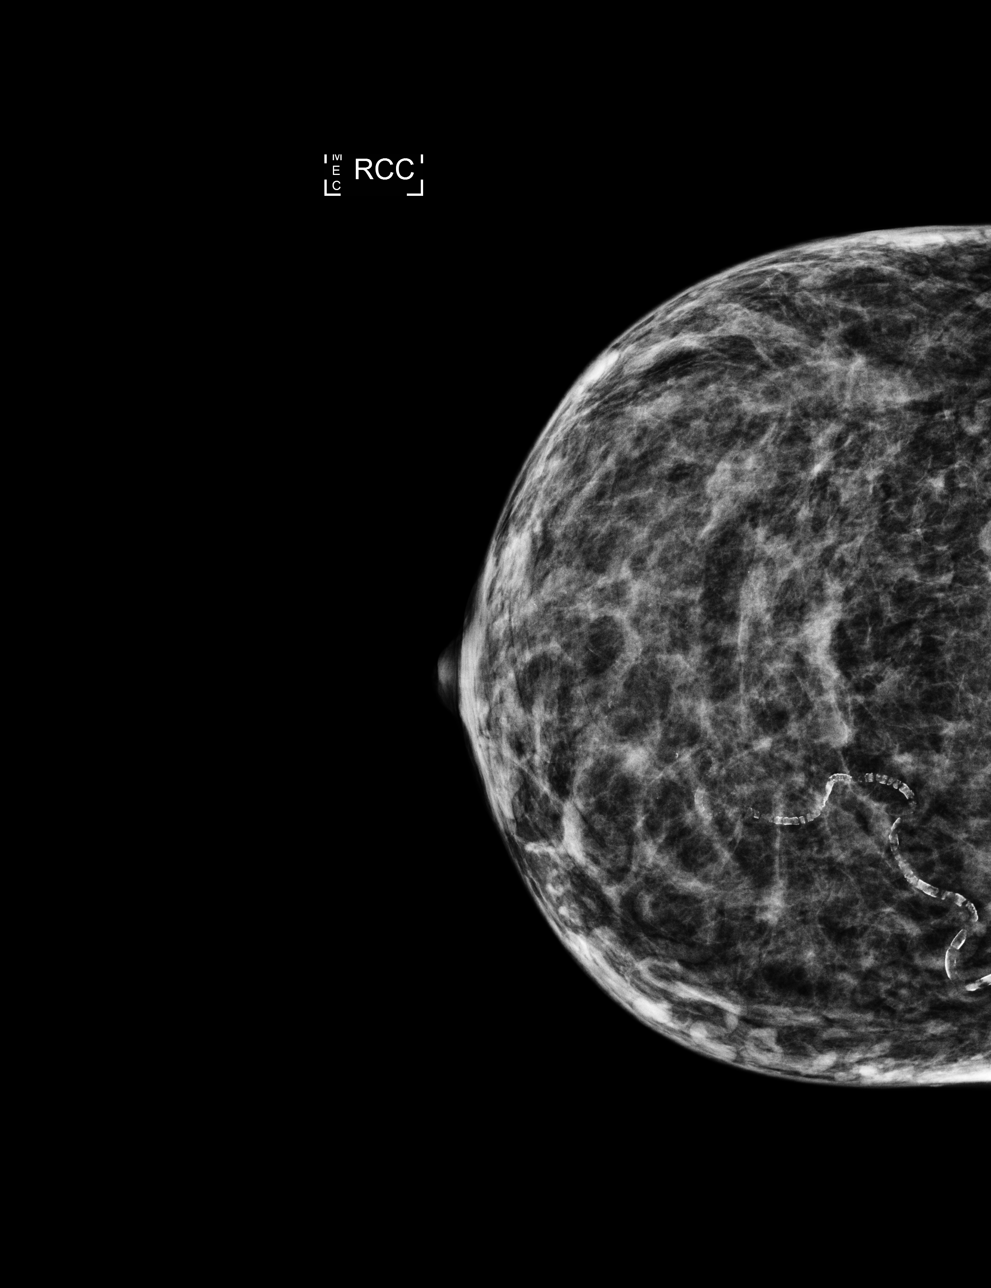

[R MLO]
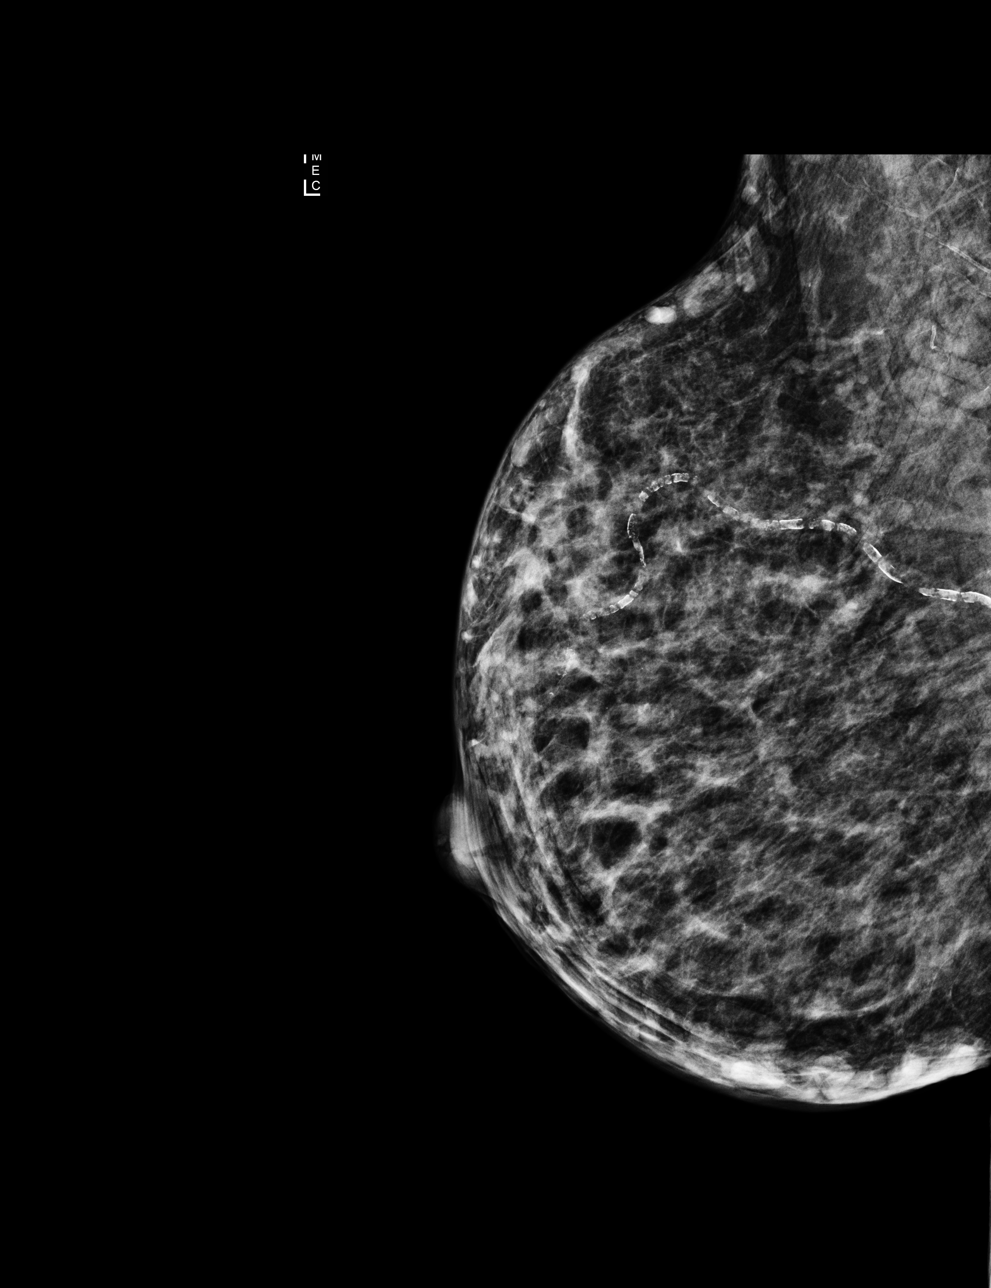

[4 of 4 positions shown; findings below may reference images not displayed]

ACR Breast Density Category d: The breast tissue is extremely dense,
which lowers the sensitivity of mammography.
FINDINGS: There are no findings suspicious for malignancy. Images were
processed with CAD.
IMPRESSION: No mammographic evidence of malignancy. A result letter of this
screening mammogram will be mailed directly to the patient.

RECOMMENDATION:
Screening mammogram in one year. (Code:[id number]-D-K0F)

BI-RADS CATEGORY  1: Negative.

## 2018-12-06 NOTE — Progress Notes (Signed)
Patient [facility]   New Patient--Establish Care  Subjective:  Patient ID: Tina Watkins, female    DOB: 1965-09-03  Age: 53 y.o. MRN: [id number]  CC:  Chief Complaint  Patient presents with   Establish Care    Multiple Medical Problems    HPI Tina Watkins is a 53 year old female who presents to [place] today.    Past Medical History:  Diagnosis Date   Bacteremia due to Gram-negative bacteria 05/23/2011   Blind    right eye   Blind right eye    Chronic lower back pain    Complication of anesthesia    "woke up during OR; I have an extremely high tolerance" (12/11/2011) 1 procedure was graft; the other procedures were procedures that are typically done with sedation.   DDD (degenerative disc disease), cervical    Depression    Dysrhythmia    "tachycardia" (12/11/2011) new onset afib 10/15/14 EKG   E coli bacteremia 06/18/2011   ESRD (end stage renal disease) ([place]) 06/12/2011   Fibromyalgia    Gastroparesis    Gastroparesis    GERD (gastroesophageal reflux disease)    Glaucoma    right eye   Gout    H/O carpal tunnel syndrome    Headache(784.0)    "not often anymore" (12/11/2011)   Herpes genitalia 1994   History of blood transfusion    "more than a few times" (12/11/2011)   History of stomach ulcers    Hypotension    Iron deficiency anemia    New onset a-fib ([place])    10/15/14 EKG   Osteopenia    Peripheral neuropathy 11/2018   Seizures ([place]) 1994   "post transplant; only have had that one" (12/11/2011)   Spinal stenosis in cervical region    Stroke [facility])     left basal ganglia lacunar infarct; Right frontal lobe lacunar infarct.   Stroke [facility]) ~ 1999; 2001   "briefly lost my vision; lost my right eye" (12/11/2011)   Current Status: This will be her initial office visit with me. She was previously seeing Dr. Sandi Mariscal, MD at [facility] for her PCP needs.  She has multiple health problems as listed in her Past Medical History. Since her last office visit, she is doing well with no complaints. She is currently receiving bi-weekly Aranesp/Procrit injection weekly at [facility], per Nephrologist, Dr. Turner Daniels. Her last injection was 12/05/2018. She is a kidney transplant patient. She has history of Gout and is currently taking Colchicine for gouty episodes.  She denies fevers, chills, fatigue, recent infections, weight loss, and night sweats. She has not had any headaches, visual changes, dizziness, and falls. No chest pain, heart palpitations, cough and shortness of breath reported. No reports of GI problems such as nausea, vomiting, diarrhea, and constipation. She has no reports of blood in stools, dysuria and hematuria. No depression or anxiety reported today.   Past Surgical History:  Procedure Laterality Date   ANTERIOR CERVICAL DECOMP/DISCECTOMY FUSION N/A 01/08/2015   Procedure: Anterior Cervical Three-Four/Four-Five Decompression/Diskectomy/Fusion;  Surgeon: Leeroy Cha, MD;  Location: [facility];  Service: Neurosurgery;  Laterality: N/A;  C3-4 C4-5 Anterior cervical decompression/diskectomy/fusion   APPENDECTOMY  ~ 2004   CATARACT EXTRACTION     right eye   COLONOSCOPY     ENUCLEATION  2001   "right"   ESOPHAGOGASTRODUODENOSCOPY (EGD) WITH PROPOFOL N/A 04/21/2012   Procedure: ESOPHAGOGASTRODUODENOSCOPY (EGD) WITH PROPOFOL;  Surgeon: Quillian Quince  Merrily Brittle, MD;  Location: [facility];  Service: Endoscopy;  Laterality: N/A;   INSERTION OF DIALYSIS CATHETER  1988   "AV graft LUA & LFA; LUA worked for 1 day; LFA never worked"   KIDNEY TRANSPLANT  1994; 1999; 2005   "right"   [place]?; 1994; 2005    Family History  Problem Relation Age of Onset   Glaucoma Mother    Pancreatic cancer Father    Multiple sclerosis Brother    Hypertension Maternal Grandmother     Breast cancer Neg Hx     Social History   Socioeconomic History   Marital status: Single    Spouse name: Not on file   Number of children: 0   Years of education: some college   Highest education level: Not on file  Occupational History   Occupation: disabled    Fish farm manager: UNEMPLOYED  Social Designer, fashion/clothing strain: Not on file   Food insecurity    Worry: Not on file    Inability: Not on file   Transportation needs    Medical: Not on file    Non-medical: Not on file  Tobacco Use   Smoking status: Current Every Day Smoker    Packs/day: 0.20    Years: 28.00    Pack years: 5.60    Types: Cigarettes   Smokeless tobacco: Never Used   Tobacco comment: had quit 03/05/2011  Substance and Sexual Activity   Alcohol use: Yes    Comment: rare   Drug use: Yes    Types: Marijuana    Comment: occasional use   Sexual activity: Not Currently  Lifestyle   Physical activity    Days per week: Not on file    Minutes per session: Not on file   Stress: Not on file  Relationships   Social connections    Talks on phone: Not on file    Gets together: Not on file    Attends religious service: Not on file    Active member of club or organization: Not on file    Attends meetings of clubs or organizations: Not on file    Relationship status: Not on file   Intimate partner violence    Fear of current or ex partner: Not on file    Emotionally abused: Not on file    Physically abused: Not on file    Forced sexual activity: Not on file  Other Topics Concern   Not on file  Social History Narrative   Right-handed.   Four cups caffeine per day.   Lives alone.    Outpatient Medications Prior to Visit  Medication Sig Dispense Refill   aspirin EC 81 MG tablet Take 81 mg by mouth daily.      azaTHIOprine (IMURAN) 50 MG tablet Take 100 mg by mouth daily.      colchicine 0.6 MG tablet Take 0.6-1.2 mg by mouth See admin instructions. Take two tablets by mouth  at onset, then take one tablet by mouth three times daily as needed for gout     Darbepoetin Alfa (ARANESP, ALBUMIN FREE, IJ) Inject as directed every 14 (fourteen) days.     fluticasone (FLONASE) 50 MCG/ACT nasal spray Place 1 spray into both nostrils daily. 16 g 0   furosemide (LASIX) 40 MG tablet Take 40 mg by mouth at bedtime.   2   gabapentin (NEURONTIN) 100 MG capsule 1 (ONE) CAPSULE  TWO TIMES DAILY, AS NEEDED, MAX DAILY DOSE  2 CAPSULE     latanoprost (XALATAN) 0.005 % ophthalmic solution Place 1 drop into the left eye at bedtime.  3   metoCLOPramide (REGLAN) 10 MG tablet Take 1 tablet (10 mg total) by mouth every 8 (eight) hours as needed for nausea. 40 tablet 0   metoCLOPramide (REGLAN) 5 MG tablet Take 1 tablet (5 mg total) by mouth 2 (two) times daily. 180 tablet 0   omeprazole (PRILOSEC) 40 MG capsule Take 1 capsule (40 mg total) by mouth daily. (Patient taking differently: Take 40 mg by mouth 2 (two) times daily. ) 40 capsule 0   ondansetron (ZOFRAN ODT) 4 MG disintegrating tablet Take 1 tablet (4 mg total) by mouth every 6 (six) hours as needed. 20 tablet 0   oxyCODONE-acetaminophen (PERCOCET) 10-325 MG tablet Take 1 tablet by mouth every 6 (six) hours as needed for pain.     predniSONE (DELTASONE) 5 MG tablet Take 5 mg by mouth daily.     PROVENTIL HFA 108 (90 Base) MCG/ACT inhaler Inhale 2 puffs into the lungs 4 (four) times daily as needed for shortness of breath.  2   tacrolimus (PROGRAF) 1 MG capsule Take 2-3 mg by mouth See admin instructions. Take 3 capsules every morning and take 2 capsules every evening     zolpidem (AMBIEN) 10 MG tablet Take 10 mg by mouth at bedtime as needed for sleep.     nicotine (NICODERM CQ - DOSED IN MG/24 HOURS) 21 mg/24hr patch Place 1 patch (21 mg total) onto the skin daily. (Patient not taking: Reported on 12/06/2018) 28 patch 0   No facility-administered medications prior to visit.     Allergies  Allergen Reactions    Levofloxacin Itching and Rash    ROS Review of Systems  Constitutional: Negative.   HENT: Negative.   Eyes: Positive for visual disturbance (right eye blindness).  Respiratory: Negative.   Cardiovascular: Negative.   Gastrointestinal: Negative.   Endocrine: Negative.   Genitourinary: Negative.        Dr. Beacher May.   Musculoskeletal: Negative.   Skin: Negative.   Allergic/Immunologic: Negative.   Neurological: Negative.   Hematological: Negative.   Psychiatric/Behavioral: Negative.       Objective:    Physical Exam  Constitutional: She is oriented to person, place, and time. She appears well-developed and well-nourished.  HENT:  Head: Normocephalic and atraumatic.  Eyes: Conjunctivae are normal.  Neck: Normal range of motion. Neck supple.  Cardiovascular: Normal rate, regular rhythm, normal heart sounds and intact distal pulses.  Pulmonary/Chest: Effort normal and breath sounds normal.  Abdominal: Soft. Bowel sounds are normal.  Musculoskeletal: Normal range of motion.  Neurological: She is alert and oriented to person, place, and time. She has normal reflexes.  Skin: Skin is warm and dry.  Psychiatric: She has a normal mood and affect. Her behavior is normal. Judgment and thought content normal.  Nursing note and vitals reviewed.   BP (!) 111/59 Comment: per patient hypotension.   Pulse 84    Temp 98.1 F (36.7 C) (Oral)    Ht 4\' 11"  (1.499 m)    Wt 124 lb 9.6 oz (56.5 kg)    SpO2 99%    BMI 25.17 kg/m  Wt Readings from Last 3 Encounters:  12/06/18 124 lb 9.6 oz (56.5 kg)  11/02/18 119 lb 14.4 oz (54.4 kg)  10/11/18 114 lb 14.4 oz (52.1 kg)     Health Maintenance Due  Topic  Date Due   TETANUS/TDAP  08/27/1986   PAP SMEAR-Modifier  08/26/1988   COLONOSCOPY  08/26/2017   INFLUENZA VACCINE  09/03/2018    There are no preventive care reminders to display for this patient.  Lab Results  Component Value Date   TSH 0.839 12/10/2011   Lab  Results  Component Value Date   WBC 4.0 08/25/2018   HGB 10.4 (L) 12/02/2018   HCT 30.9 (L) 08/25/2018   MCV 101.6 (H) 08/25/2018   PLT 151 08/25/2018   Lab Results  Component Value Date   NA 142 11/04/2018   K 5.0 11/04/2018   CO2 22 11/04/2018   GLUCOSE 102 (H) 11/04/2018   BUN 27 (H) 11/04/2018   CREATININE 2.01 (H) 11/04/2018   BILITOT 1.0 11/04/2018   ALKPHOS 95 11/04/2018   AST 19 11/04/2018   ALT 14 11/04/2018   PROT 6.0 (L) 11/04/2018   ALBUMIN 3.8 11/04/2018   CALCIUM 9.1 11/04/2018   ANIONGAP 10 11/04/2018   Lab Results  Component Value Date   CHOL  08/23/2008    171        ATP III CLASSIFICATION:  <200     mg/dL   Desirable  200-239  mg/dL   Borderline High  >=240    mg/dL   High          Lab Results  Component Value Date   HDL 58 08/23/2008   Lab Results  Component Value Date   LDLCALC  08/23/2008    92        Total Cholesterol/HDL:CHD Risk Coronary Heart Disease Risk Table                     Men   Women  1/2 Average Risk   3.4   3.3  Average Risk       5.0   4.4  2 X Average Risk   9.6   7.1  3 X Average Risk  23.4   11.0        Use the calculated Patient Ratio above and the CHD Risk Table to determine the patient's CHD Risk.        ATP III CLASSIFICATION (LDL):  <100     mg/dL   Optimal  100-129  mg/dL   Near or Above                    Optimal  130-159  mg/dL   Borderline  160-189  mg/dL   High  >190     mg/dL   Very High   Lab Results  Component Value Date   TRIG 103 08/23/2008   Lab Results  Component Value Date   CHOLHDL 2.9 08/23/2008   Lab Results  Component Value Date   HGBA1C 4.6 12/06/2018   Assessment & Plan:   1. Encounter to establish care  2. Stage 3 chronic kidney disease, unspecified whether stage 3a or 3b CKD Continue to follow up with Nephrologist as needed.   3. DDD (degenerative disc disease), cervical Continue to follow up in with [facility] as needed for pain management.   4. 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 Continue Colchicine as prescribed.  5. Neuropathy  6. Smoker  7. Encounter for smoking cessation counseling  8. Under care of Pain Management Specialist Continue to follow up with [facility] as needed.   9. Healthcare maintenance - POCT glycosylated  hemoglobin (Hb A1C) - POCT glucose (manual entry) - Lipid Panel - TSH - Uric Acid - Vitamin B12 - Vitamin D, 25-hydroxy  10. Follow up She will follow up in 1 months.   No orders of the defined types were placed in this encounter.   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   Follow-up: Return in about 1 month (around 01/05/2019).    [name], FNP

## 2018-12-07 LAB — LIPID PANEL
Chol/HDL Ratio: 2.4 ratio (ref 0.0–4.4)
Cholesterol, Total: 214 mg/dL — ABNORMAL HIGH (ref 100–199)
HDL: 89 mg/dL (ref >39)
LDL Chol Calc (NIH): 105 mg/dL — ABNORMAL HIGH (ref 0–99)
Triglycerides: 118 mg/dL (ref 0–149)
VLDL Cholesterol Cal: 20 mg/dL (ref 5–40)

## 2018-12-07 LAB — VITAMIN B12: Vitamin B-12: 582 pg/mL (ref 232–1245)

## 2018-12-07 LAB — VITAMIN D 25 HYDROXY (VIT D DEFICIENCY, FRACTURES): Vit D, 25-Hydroxy: 13.7 ng/mL — ABNORMAL LOW (ref 30.0–100.0)

## 2018-12-07 LAB — TSH: TSH: 0.878 u[IU]/mL (ref 0.450–4.500)

## 2018-12-07 LAB — URIC ACID: Uric Acid: 11.1 mg/dL — ABNORMAL HIGH (ref 2.5–7.1)

## 2018-12-09 DIAGNOSIS — D492 Neoplasm of unspecified behavior of bone, soft tissue, and skin: Secondary | ICD-10-CM | POA: Diagnosis not present

## 2018-12-16 ENCOUNTER — Ambulatory Visit (HOSPITAL_COMMUNITY)
Admission: RE | Admit: 2018-12-16 | Discharge: 2018-12-16 | Disposition: A | Discharge status: Home / Self Care | Payer: Medicaid Other | Source: Ambulatory Visit | Attending: Nephrology | Admitting: Nephrology

## 2018-12-16 ENCOUNTER — Other Ambulatory Visit: Payer: Self-pay

## 2018-12-16 VITALS — BP 99/72 | HR 72 | Temp 95.5°F | Resp 18

## 2018-12-16 DIAGNOSIS — D631 Anemia in chronic kidney disease: Secondary | ICD-10-CM | POA: Diagnosis not present

## 2018-12-16 DIAGNOSIS — Z940 Kidney transplant status: Secondary | ICD-10-CM | POA: Insufficient documentation

## 2018-12-16 DIAGNOSIS — N185 Chronic kidney disease, stage 5: Secondary | ICD-10-CM | POA: Diagnosis not present

## 2018-12-16 LAB — POCT HEMOGLOBIN-HEMACUE: Hemoglobin: 10.9 g/dL — ABNORMAL LOW (ref 12.0–15.0)

## 2018-12-16 LAB — IRON AND TIBC
Iron: 78 ug/dL (ref 28–170)
Saturation Ratios: 38 % — ABNORMAL HIGH (ref 10.4–31.8)
TIBC: 206 ug/dL — ABNORMAL LOW (ref 250–450)
UIBC: 128 ug/dL

## 2018-12-16 LAB — PHOSPHORUS: Phosphorus: 3.6 mg/dL (ref 2.5–4.6)

## 2018-12-16 LAB — FERRITIN: Ferritin: 1034 ng/mL — ABNORMAL HIGH (ref 11–307)

## 2018-12-16 MED ORDER — EPOETIN ALFA 20000 UNIT/ML IJ SOLN
40000.0000 [IU] | INTRAMUSCULAR | Status: DC
  Administered 2018-12-16: 40000 [IU] via SUBCUTANEOUS

## 2018-12-16 MED ORDER — EPOETIN ALFA 40000 UNIT/ML IJ SOLN
INTRAMUSCULAR | Status: AC
  Filled 2018-12-16: qty 1

## 2018-12-20 ENCOUNTER — Other Ambulatory Visit: Payer: Self-pay | Admitting: Family Medicine

## 2018-12-20 ENCOUNTER — Encounter: Payer: Self-pay | Admitting: Family Medicine

## 2018-12-20 DIAGNOSIS — E559 Vitamin D deficiency, unspecified: Secondary | ICD-10-CM

## 2018-12-20 DIAGNOSIS — M1A3720 Chronic gout due to renal impairment, left ankle and foot, without tophus (tophi): Secondary | ICD-10-CM

## 2018-12-20 MED ORDER — VITAMIN D (ERGOCALCIFEROL) 1.25 MG (50000 UNIT) PO CAPS: 50000.0000 [IU] | ORAL_CAPSULE | ORAL | 6 refills | Status: DC

## 2018-12-30 ENCOUNTER — Other Ambulatory Visit: Payer: Self-pay

## 2018-12-30 ENCOUNTER — Ambulatory Visit (HOSPITAL_COMMUNITY)
Admission: RE | Admit: 2018-12-30 | Discharge: 2018-12-30 | Disposition: A | Discharge status: Home / Self Care | Payer: Medicaid Other | Source: Ambulatory Visit | Attending: Nephrology | Admitting: Nephrology

## 2018-12-30 VITALS — BP 107/64 | HR 68 | Temp 96.7°F | Resp 18

## 2018-12-30 DIAGNOSIS — Z940 Kidney transplant status: Secondary | ICD-10-CM | POA: Diagnosis not present

## 2018-12-30 DIAGNOSIS — D631 Anemia in chronic kidney disease: Secondary | ICD-10-CM

## 2018-12-30 DIAGNOSIS — N185 Chronic kidney disease, stage 5: Secondary | ICD-10-CM | POA: Diagnosis present

## 2018-12-30 DIAGNOSIS — Z79891 Long term (current) use of opiate analgesic: Secondary | ICD-10-CM | POA: Diagnosis not present

## 2018-12-30 LAB — COMPREHENSIVE METABOLIC PANEL
ALT: 15 U/L (ref 0–44)
AST: 22 U/L (ref 15–41)
Albumin: 3.9 g/dL (ref 3.5–5.0)
Alkaline Phosphatase: 103 U/L (ref 38–126)
Anion gap: 10 (ref 5–15)
BUN: 31 mg/dL — ABNORMAL HIGH (ref 6–20)
CO2: 21 mmol/L — ABNORMAL LOW (ref 22–32)
Calcium: 9.4 mg/dL (ref 8.9–10.3)
Chloride: 111 mmol/L (ref 98–111)
Creatinine, Ser: 2.21 mg/dL — ABNORMAL HIGH (ref 0.44–1.00)
GFR calc Af Amer: 29 mL/min — ABNORMAL LOW (ref >60)
GFR calc non Af Amer: 25 mL/min — ABNORMAL LOW (ref >60)
Glucose, Bld: 95 mg/dL (ref 70–99)
Potassium: 4.6 mmol/L (ref 3.5–5.1)
Sodium: 142 mmol/L (ref 135–145)
Total Bilirubin: 1 mg/dL (ref 0.3–1.2)
Total Protein: 6.1 g/dL — ABNORMAL LOW (ref 6.5–8.1)

## 2018-12-30 LAB — POCT HEMOGLOBIN-HEMACUE: Hemoglobin: 11 g/dL — ABNORMAL LOW (ref 12.0–15.0)

## 2018-12-30 MED ORDER — EPOETIN ALFA 40000 UNIT/ML IJ SOLN
INTRAMUSCULAR | Status: AC
  Administered 2018-12-30: 40000 [IU] via SUBCUTANEOUS
  Filled 2018-12-30: qty 1

## 2018-12-30 MED ORDER — EPOETIN ALFA 20000 UNIT/ML IJ SOLN: 40000.0000 [IU] | INTRAMUSCULAR | Status: DC

## 2018-12-31 LAB — PTH, INTACT AND CALCIUM
Calcium, Total (PTH): 9.5 mg/dL (ref 8.7–10.2)
PTH: 128 pg/mL — ABNORMAL HIGH (ref 15–65)

## 2019-01-04 ENCOUNTER — Encounter: Payer: Self-pay | Admitting: Family Medicine

## 2019-01-04 ENCOUNTER — Other Ambulatory Visit: Payer: Self-pay

## 2019-01-04 ENCOUNTER — Ambulatory Visit (INDEPENDENT_AMBULATORY_CARE_PROVIDER_SITE_OTHER): Payer: Medicaid Other | Admitting: Family Medicine

## 2019-01-04 VITALS — BP 111/68 | HR 72 | Temp 98.0°F | Ht 59.0 in | Wt 128.0 lb

## 2019-01-04 DIAGNOSIS — N183 Chronic kidney disease, stage 3 unspecified: Secondary | ICD-10-CM | POA: Diagnosis not present

## 2019-01-04 DIAGNOSIS — Z720 Tobacco use: Secondary | ICD-10-CM | POA: Diagnosis not present

## 2019-01-04 DIAGNOSIS — R1900 Intra-abdominal and pelvic swelling, mass and lump, unspecified site: Secondary | ICD-10-CM | POA: Diagnosis not present

## 2019-01-04 DIAGNOSIS — F17200 Nicotine dependence, unspecified, uncomplicated: Secondary | ICD-10-CM

## 2019-01-04 DIAGNOSIS — M5030 Other cervical disc degeneration, unspecified cervical region: Secondary | ICD-10-CM

## 2019-01-04 DIAGNOSIS — M1A3720 Chronic gout due to renal impairment, left ankle and foot, without tophus (tophi): Secondary | ICD-10-CM

## 2019-01-04 DIAGNOSIS — Z09 Encounter for follow-up examination after completed treatment for conditions other than malignant neoplasm: Secondary | ICD-10-CM | POA: Diagnosis not present

## 2019-01-04 LAB — POCT URINALYSIS DIPSTICK
Bilirubin, UA: NEGATIVE
Blood, UA: NEGATIVE
Glucose, UA: NEGATIVE
Ketones, UA: NEGATIVE
Leukocytes, UA: NEGATIVE
Nitrite, UA: NEGATIVE
Protein, UA: NEGATIVE
Spec Grav, UA: 1.02 (ref 1.010–1.025)
Urobilinogen, UA: 0.2 E.U./dL
pH, UA: 7 (ref 5.0–8.0)

## 2019-01-04 NOTE — Progress Notes (Signed)
Patient [facility]    Established Patient Office Visit  Subjective:  Patient ID: Tina Watkins, female    DOB: 02-Jan-1966  Age: 53 y.o. MRN: [id number]  CC:  Chief Complaint  Patient presents with  . Follow-up  . Abdominal Pain    bloating, onset months (will call GI MD)  . Rash    rash on face (appointment to see Derm at [facility])     HPI Tina Watkins is a 53 year old female who presents for Follow Up today.   Past Medical History:  Diagnosis Date  . Bacteremia due to Gram-negative bacteria 05/23/2011  . Blind    right eye  . Blind right eye   . Chronic lower back pain   . Complication of anesthesia    "woke up during OR; I have an extremely high tolerance" (12/11/2011) 1 procedure was graft; the other procedures were procedures that are typically done with sedation.  . DDD (degenerative disc disease), cervical   . Depression   . Dysrhythmia    "tachycardia" (12/11/2011) new onset afib 10/15/14 EKG  . E coli bacteremia 06/18/2011  . Elevated LDL cholesterol level 12/2018  . ESRD (end stage renal disease) ([place]) 06/12/2011  . Fibromyalgia   . Gastroparesis   . Gastroparesis   . GERD (gastroesophageal reflux disease)   . Glaucoma    right eye  . Gout   . H/O carpal tunnel syndrome   . Headache(784.0)    "not often anymore" (12/11/2011)  . Herpes genitalia 1994  . History of blood transfusion    "more than a few times" (12/11/2011)  . History of stomach ulcers   . Hypotension   . Iron deficiency anemia   . New onset a-fib ([place])    10/15/14 EKG  . Osteopenia   . Peripheral neuropathy 11/2018  . Seizures ([place]) 1994   "post transplant; only have had that one" (12/11/2011)  . Spinal stenosis in cervical region   . Stroke [facility])     left basal ganglia lacunar infarct; Right frontal lobe lacunar infarct.  . Stroke [facility]) ~ 1999; 2001   "briefly lost my vision; lost my right eye" (12/11/2011)  . Vitamin D  deficiency 12/2018   Current Status: Since her last office visit, she has c/o swollen abdomen X 2 months. She will follow up with Dr. Ardis Hughs in GI, today. She is currently receiving bi-weekly Aranesp/Procrit injection weekly at [facility], per Nephrologist, Dr. Turner Daniels. Her last injection was 12/30/2018. She is a kidney transplant patient. She continues to see Pain Management. She denies fevers, chills, fatigue, recent infections, weight loss, and night sweats. She has not had any headaches, visual changes, dizziness, and falls. No chest pain, heart palpitations, cough and shortness of breath reported. No reports of GI problems such as nausea, vomiting, diarrhea, and constipation. She has no reports of blood in stools, dysuria and hematuria. No depression or anxiety reported today.   Past Surgical History:  Procedure Laterality Date  . ANTERIOR CERVICAL DECOMP/DISCECTOMY FUSION N/A 01/08/2015   Procedure: Anterior Cervical Three-Four/Four-Five Decompression/Diskectomy/Fusion;  Surgeon: Leeroy Cha, MD;  Location: [facility];  Service: Neurosurgery;  Laterality: N/A;  C3-4 C4-5 Anterior cervical decompression/diskectomy/fusion  . APPENDECTOMY  ~ 2004  . CATARACT EXTRACTION     right eye  . COLONOSCOPY    . ENUCLEATION  2001   "right"  . ESOPHAGOGASTRODUODENOSCOPY (EGD) WITH PROPOFOL N/A 04/21/2012   Procedure: ESOPHAGOGASTRODUODENOSCOPY (EGD) WITH PROPOFOL;  Surgeon: Milus Banister, MD;  Location: [facility];  Service: Endoscopy;  Laterality: N/A;  . 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 [place]; 1999; 2005   "right"  . MULTIPLE TOOTH EXTRACTIONS    . TONSILLECTOMY    . TOTAL NEPHRECTOMY  1988?; 1994; 2005    Family History  Problem Relation Age of Onset  . Glaucoma Mother   . Pancreatic cancer Father   . Multiple sclerosis Brother   . Hypertension Maternal Grandmother   . Breast cancer Neg  Hx     Social History   Socioeconomic History  . Marital status: Single    Spouse name: Not on file  . Number of children: 0  . Years of education: some college  . Highest education level: Not on file  Occupational History  . Occupation: disabled    Fish farm manager: UNEMPLOYED  Social Needs  . Financial resource strain: Not on file  . Food insecurity    Worry: Not on file    Inability: Not on file  . Transportation needs    Medical: Not on file    Non-medical: Not on file  Tobacco Use  . Smoking status: Current Every Day Smoker    Packs/day: 0.20    Years: 28.00    Pack years: 5.60    Types: Cigarettes  . Smokeless tobacco: Never Used  . Tobacco comment: had quit 03/05/2011  Substance and Sexual Activity  . Alcohol use: Yes    Comment: rare  . Drug use: Yes    Types: Marijuana    Comment: occasional use  . Sexual activity: Yes  Lifestyle  . Physical activity    Days per week: Not on file    Minutes per session: Not on file  . Stress: Not on file  Relationships  . Social Herbalist on phone: Not on file    Gets together: Not on file    Attends religious service: Not on file    Active member of club or organization: Not on file    Attends meetings of clubs or organizations: Not on file    Relationship status: Not on file  . Intimate partner violence    Fear of current or ex partner: Not on file    Emotionally abused: Not on file    Physically abused: Not on file    Forced sexual activity: Not on file  Other Topics Concern  . Not on file  Social History Narrative   Right-handed.   Four cups caffeine per day.   Lives alone.    Outpatient Medications Prior to Visit  Medication Sig Dispense Refill  . aspirin EC 81 MG tablet Take 81 mg by mouth daily.     [name] azaTHIOprine (IMURAN) 50 MG tablet Take 100 mg by mouth daily.     . colchicine 0.6 MG tablet Take 0.6-1.2 mg by mouth See admin instructions. Take two tablets by mouth at onset, then take one tablet  by mouth three times daily as needed for gout    . Darbepoetin Alfa (ARANESP, ALBUMIN FREE, IJ) Inject as directed every 14 (fourteen) days.    . fluticasone (FLONASE) 50 MCG/ACT nasal spray Place 1 spray into both nostrils daily. 16 g 0  . furosemide (LASIX) 40 MG tablet Take 40 mg by mouth at bedtime.   2  . gabapentin (NEURONTIN) 100 MG capsule 1 (ONE)  CAPSULE TWO TIMES DAILY, AS NEEDED, MAX DAILY DOSE  2 CAPSULE    . latanoprost (XALATAN) 0.005 % ophthalmic solution Place 1 drop into the left eye at bedtime.  3  . metoCLOPramide (REGLAN) 10 MG tablet Take 1 tablet (10 mg total) by mouth every 8 (eight) hours as needed for nausea. 40 tablet 0  . metoCLOPramide (REGLAN) 5 MG tablet Take 1 tablet (5 mg total) by mouth 2 (two) times daily. 180 tablet 0  . omeprazole (PRILOSEC) 40 MG capsule Take 1 capsule (40 mg total) by mouth daily. (Patient taking differently: Take 40 mg by mouth 2 (two) times daily. ) 40 capsule 0  . ondansetron (ZOFRAN ODT) 4 MG disintegrating tablet Take 1 tablet (4 mg total) by mouth every 6 (six) hours as needed. 20 tablet 0  . oxyCODONE-acetaminophen (PERCOCET) 10-325 MG tablet Take 1 tablet by mouth every 6 (six) hours as needed for pain.    . predniSONE (DELTASONE) 5 MG tablet Take 5 mg by mouth daily.    [name] PROVENTIL HFA 108 (90 Base) MCG/ACT inhaler Inhale 2 puffs into the lungs 4 (four) times daily as needed for shortness of breath.  2  . tacrolimus (PROGRAF) 1 MG capsule Take 2-3 mg by mouth See admin instructions. Take 3 capsules every morning and take 2 capsules every evening    . zolpidem (AMBIEN) 10 MG tablet Take 10 mg by mouth at bedtime as needed for sleep.    . Vitamin D, Ergocalciferol, (DRISDOL) 1.25 MG (50000 UT) CAPS capsule Take 1 capsule (50,000 Units total) by mouth every 7 (seven) days. (Patient not taking: Reported on 01/04/2019) 5 capsule 6  . nicotine (NICODERM CQ - DOSED IN MG/24 HOURS) 21 mg/24hr patch Place 1 patch (21 mg total) onto the skin daily.  (Patient not taking: Reported on 12/06/2018) 28 patch 0   No facility-administered medications prior to visit.     Allergies  Allergen Reactions  . Levofloxacin Itching and Rash    ROS Review of Systems  Constitutional: Negative.   HENT: Negative.   Eyes: Negative.   Respiratory: Negative.   Cardiovascular: Negative.   Gastrointestinal: Positive for abdominal distention (swollen).  Endocrine: Negative.   Genitourinary: Negative.   Musculoskeletal: Positive for arthralgias (generalized joint aches and pain).  Skin: Negative.   Allergic/Immunologic: Negative.   Neurological: Negative.   Hematological: Negative.   Psychiatric/Behavioral: Negative.       Objective:    Physical Exam  Constitutional: She is oriented to person, place, and time. She appears well-developed and well-nourished.  HENT:  Head: Normocephalic and atraumatic.  Eyes: Conjunctivae are normal.  Neck: Normal range of motion. Neck supple.  Cardiovascular: Normal rate, regular rhythm, normal heart sounds and intact distal pulses.  Pulmonary/Chest: Effort normal and breath sounds normal.  Abdominal: Soft. Bowel sounds are normal. She exhibits distension (swollen).  Musculoskeletal: Normal range of motion.  Neurological: She is alert and oriented to person, place, and time. She has normal reflexes.  Skin: Skin is warm and dry.  Psychiatric: She has a normal mood and affect. Her behavior is normal. Judgment and thought content normal.  Nursing note and vitals reviewed.   BP 111/68   Pulse 72   Temp 98 F (36.7 C) (Oral)   Ht 4\' 11"  (1.499 m)   Wt 128 lb (58.1 kg)   SpO2 98%   BMI 25.85 kg/m  Wt Readings from Last 3 Encounters:  01/04/19 128 lb (58.1 kg)  12/06/18 124 lb 9.6 oz (  56.5 kg)  11/02/18 119 lb 14.4 oz (54.4 kg)     Health Maintenance Due  Topic Date Due  . TETANUS/TDAP  08/27/1986  . PAP SMEAR-Modifier  08/26/1988  . COLONOSCOPY  08/26/2017  . INFLUENZA VACCINE  09/03/2018     There are no preventive care reminders to display for this patient.  Lab Results  Component Value Date   TSH 0.878 12/06/2018   Lab Results  Component Value Date   WBC 4.0 08/25/2018   HGB 11.0 (L) 12/30/2018   HCT 30.9 (L) 08/25/2018   MCV 101.6 (H) 08/25/2018   PLT 151 08/25/2018   Lab Results  Component Value Date   NA 142 12/30/2018   K 4.6 12/30/2018   CO2 21 (L) 12/30/2018   GLUCOSE 95 12/30/2018   BUN 31 (H) 12/30/2018   CREATININE 2.21 (H) 12/30/2018   BILITOT 1.0 12/30/2018   ALKPHOS 103 12/30/2018   AST 22 12/30/2018   ALT 15 12/30/2018   PROT 6.1 (L) 12/30/2018   ALBUMIN 3.9 12/30/2018   CALCIUM 9.4 12/30/2018   CALCIUM 9.5 12/30/2018   ANIONGAP 10 12/30/2018   Lab Results  Component Value Date   CHOL 214 (H) 12/06/2018   Lab Results  Component Value Date   HDL 89 12/06/2018   Lab Results  Component Value Date   LDLCALC 105 (H) 12/06/2018   Lab Results  Component Value Date   TRIG 118 12/06/2018   Lab Results  Component Value Date   CHOLHDL 2.4 12/06/2018   Lab Results  Component Value Date   HGBA1C 4.6 12/06/2018      Assessment & Plan:   1. Swollen abdomen Scheduled to follow up with GI today.   2. Tobacco user  3. 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   4. Stage 3 chronic kidney disease, unspecified whether stage 3a or 3b CKD  5. DDD (degenerative disc disease), cervical She continues to follow up with Pain Management for pain medications.   6. Smoker  7. Declined smoking cessation Benefits of quitting smoking reviewed with patient in detail. She plans to quit smoking 02/2019.   8. Follow up She will follow up in 4 months.   No orders of the defined types were placed in this encounter.   No orders of the defined types were placed in this encounter.   Referral Orders  No referral(s) requested today    Kathe Becton,  MSN, FNP-BC [facility] [address] 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   Follow-up: Return in about 4 months (around 05/05/2019).    [name], FNP

## 2019-01-04 NOTE — Addendum Note (Signed)
Addended by: Franchot Gallo on: 01/04/2019 10:31 AM   Modules accepted: Orders

## 2019-01-06 DIAGNOSIS — D492 Neoplasm of unspecified behavior of bone, soft tissue, and skin: Secondary | ICD-10-CM | POA: Diagnosis not present

## 2019-01-11 DIAGNOSIS — T8611 Kidney transplant rejection: Secondary | ICD-10-CM | POA: Diagnosis not present

## 2019-01-11 DIAGNOSIS — Z79899 Other long term (current) drug therapy: Secondary | ICD-10-CM | POA: Diagnosis not present

## 2019-01-12 ENCOUNTER — Other Ambulatory Visit (HOSPITAL_COMMUNITY): Payer: Self-pay | Admitting: Nephrology

## 2019-01-12 DIAGNOSIS — I871 Compression of vein: Secondary | ICD-10-CM

## 2019-01-13 ENCOUNTER — Ambulatory Visit (HOSPITAL_BASED_OUTPATIENT_CLINIC_OR_DEPARTMENT_OTHER)
Admission: RE | Admit: 2019-01-13 | Discharge: 2019-01-13 | Disposition: A | Discharge status: Home / Self Care | Payer: Medicaid Other | Source: Ambulatory Visit | Attending: Nephrology | Admitting: Nephrology

## 2019-01-13 ENCOUNTER — Ambulatory Visit (HOSPITAL_COMMUNITY)
Admission: RE | Admit: 2019-01-13 | Discharge: 2019-01-13 | Disposition: A | Discharge status: Home / Self Care | Payer: Medicaid Other | Source: Ambulatory Visit | Attending: Nephrology | Admitting: Nephrology

## 2019-01-13 ENCOUNTER — Other Ambulatory Visit: Payer: Self-pay

## 2019-01-13 VITALS — BP 95/42 | HR 69 | Temp 97.6°F | Resp 18

## 2019-01-13 DIAGNOSIS — I871 Compression of vein: Secondary | ICD-10-CM

## 2019-01-13 DIAGNOSIS — Z940 Kidney transplant status: Secondary | ICD-10-CM | POA: Insufficient documentation

## 2019-01-13 DIAGNOSIS — N185 Chronic kidney disease, stage 5: Secondary | ICD-10-CM | POA: Insufficient documentation

## 2019-01-13 DIAGNOSIS — D631 Anemia in chronic kidney disease: Secondary | ICD-10-CM | POA: Diagnosis present

## 2019-01-13 LAB — POCT HEMOGLOBIN-HEMACUE: Hemoglobin: 10.8 g/dL — ABNORMAL LOW (ref 12.0–15.0)

## 2019-01-13 MED ORDER — EPOETIN ALFA 20000 UNIT/ML IJ SOLN: 40000.0000 [IU] | INTRAMUSCULAR | Status: DC

## 2019-01-13 MED ORDER — EPOETIN ALFA 40000 UNIT/ML IJ SOLN
INTRAMUSCULAR | Status: AC
  Administered 2019-01-13: 40000 [IU] via SUBCUTANEOUS
  Filled 2019-01-13: qty 1

## 2019-01-13 NOTE — Progress Notes (Signed)
BUE venous duplex       has been completed. Preliminary results can be found under CV proc through chart review. June Leap, BS, RDMS, RVT    Attempted call report to Dr. Boris Lown office. Left voicemail for nurse.

## 2019-01-16 ENCOUNTER — Other Ambulatory Visit: Payer: Self-pay | Admitting: Gastroenterology

## 2019-01-16 ENCOUNTER — Other Ambulatory Visit (HOSPITAL_COMMUNITY): Payer: Self-pay | Admitting: Nephrology

## 2019-01-16 DIAGNOSIS — I509 Heart failure, unspecified: Secondary | ICD-10-CM

## 2019-01-16 DIAGNOSIS — R0602 Shortness of breath: Secondary | ICD-10-CM

## 2019-01-19 DIAGNOSIS — L820 Inflamed seborrheic keratosis: Secondary | ICD-10-CM | POA: Diagnosis not present

## 2019-01-19 DIAGNOSIS — L819 Disorder of pigmentation, unspecified: Secondary | ICD-10-CM | POA: Diagnosis not present

## 2019-01-19 DIAGNOSIS — L821 Other seborrheic keratosis: Secondary | ICD-10-CM | POA: Diagnosis not present

## 2019-01-24 ENCOUNTER — Other Ambulatory Visit (HOSPITAL_COMMUNITY): Payer: Medicaid Other

## 2019-01-25 ENCOUNTER — Ambulatory Visit (HOSPITAL_COMMUNITY): Payer: Medicaid Other | Attending: Cardiology

## 2019-01-25 ENCOUNTER — Other Ambulatory Visit: Payer: Self-pay

## 2019-01-25 DIAGNOSIS — Z940 Kidney transplant status: Secondary | ICD-10-CM | POA: Diagnosis not present

## 2019-01-25 DIAGNOSIS — R0602 Shortness of breath: Secondary | ICD-10-CM | POA: Diagnosis not present

## 2019-01-25 DIAGNOSIS — I509 Heart failure, unspecified: Secondary | ICD-10-CM | POA: Diagnosis not present

## 2019-01-30 ENCOUNTER — Encounter (HOSPITAL_COMMUNITY)
Admission: RE | Admit: 2019-01-30 | Discharge: 2019-01-30 | Disposition: A | Discharge status: Home / Self Care | Payer: Medicaid Other | Source: Ambulatory Visit | Attending: Nephrology | Admitting: Nephrology

## 2019-01-30 ENCOUNTER — Other Ambulatory Visit: Payer: Self-pay

## 2019-01-30 VITALS — BP 94/35 | HR 67 | Temp 96.7°F | Resp 18

## 2019-01-30 DIAGNOSIS — N185 Chronic kidney disease, stage 5: Secondary | ICD-10-CM | POA: Diagnosis present

## 2019-01-30 DIAGNOSIS — D631 Anemia in chronic kidney disease: Secondary | ICD-10-CM | POA: Diagnosis present

## 2019-01-30 DIAGNOSIS — Z940 Kidney transplant status: Secondary | ICD-10-CM

## 2019-01-30 DIAGNOSIS — Z79899 Other long term (current) drug therapy: Secondary | ICD-10-CM | POA: Diagnosis not present

## 2019-01-30 LAB — POCT HEMOGLOBIN-HEMACUE: Hemoglobin: 10.3 g/dL — ABNORMAL LOW (ref 12.0–15.0)

## 2019-01-30 MED ORDER — EPOETIN ALFA 40000 UNIT/ML IJ SOLN
INTRAMUSCULAR | Status: AC
  Administered 2019-01-30: 40000 [IU]
  Filled 2019-01-30: qty 1

## 2019-01-30 MED ORDER — EPOETIN ALFA 20000 UNIT/ML IJ SOLN: 40000.0000 [IU] | INTRAMUSCULAR | Status: DC

## 2019-01-31 ENCOUNTER — Ambulatory Visit: Payer: Medicaid Other | Admitting: Gastroenterology

## 2019-02-05 NOTE — Progress Notes (Signed)
Cardiology Office Note:   Date:  02/06/2019  NAME:  Tina Watkins    MRN: [id number] DOB:  01-May-1965   PCP:  [name], FNP  Cardiologist:  Evalina Field, MD   Referring MD: Andria Meuse   Chief Complaint  Patient presents with  . Congestive Heart Failure   History of Present Illness:   Tina Watkins is a 54 y.o. female with a hx of ESRD status post renal transplant, heart failure with preserved ejection fraction, hypertension who is being seen today for the evaluation of congestive heart failure at the request of Ernest Haber, PA-C.  She presents grossly volume overloaded.  She has 2+ pitting edema up to her thighs as well as evidence of jugular venous distention.  Most recent echocardiogram shows heart failure with preserved ejection fraction grade 3 diastolic dysfunction.  Her Lasix was increased to 80 mg daily by her nephrologist.  There was concerns for possible SVC syndrome.  Really unclear why she would have this, she just appears to be grossly volume overloaded.  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  None of this requires treatment.  She has a history of renal transplant x3.  Her most recent A1c is 4.6.  Most recent LDL cholesterol 105.  She is not on a statin.  She reports her transplant medications are going well.  She reports a stable creatinine.  I discussed with her that she needs to be admitted for likely IV diuretic therapy that will need to be closely watched by nephrologist in the setting of renal transplantation.  She reports at this time she would like to increase her p.o. Lasix to see if this will help.  She is comfortable taking 40 in the morning and 80 in the afternoon.  I have instructed her to follow-up with her kidney specialist as soon as possible.  She surprisingly is not that short of breath.   She does report she is able to get around pretty well but her lower extremity edema and weight gain are bothersome to her.  She reports difficulty lying flat as well.  I suspect she has at least 20 pounds of volume on her.  Her symptoms appear to start after Thanksgiving.  I highly suspect she has had increased salty intake over the past few months.  Her overall picture is consistent with gross vascular congestion from volume overload.  DSE 09/26/12 - negative [place] Problem List 1. 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 HFpEF 60-65% Grade 3DD 3. TIA 4. HLD 5. HLD  Past Medical History: Past Medical History:  Diagnosis Date  . Bacteremia due to Gram-negative bacteria 05/23/2011  . Blind    right eye  . Blind right eye   . Chronic lower back pain   . Complication of anesthesia    "woke up during OR; I have an extremely high tolerance" (12/11/2011) 1 procedure was graft; the other procedures were procedures that are typically done with sedation.  . DDD (degenerative disc disease), cervical   . Depression   . Dysrhythmia    "tachycardia" (12/11/2011) new onset afib 10/15/14 EKG  . E coli bacteremia 06/18/2011  . Elevated LDL cholesterol level 12/2018  . ESRD (end stage renal disease) ([place]) 06/12/2011  . Fibromyalgia   . Gastroparesis   . Gastroparesis   . GERD (  gastroesophageal reflux disease)   . Glaucoma    right eye  . Gout   . H/O carpal tunnel syndrome   . Headache(784.0)    "not often anymore" (12/11/2011)  . Herpes genitalia 1994  . History of blood transfusion    "more than a few times" (12/11/2011)  . History of stomach ulcers   . Hypotension   . Iron deficiency anemia   . New onset a-fib ([place])    10/15/14 EKG  . Osteopenia   . Peripheral neuropathy 11/2018  . Seizures ([place]) 1994   "post transplant; only have had that one" (12/11/2011)  . Spinal stenosis in cervical region   . Stroke [facility]     left basal ganglia lacunar infarct;  Right frontal lobe lacunar infarct.  . Stroke [facility]) ~ 1999; 2001   "briefly lost my vision; lost my right eye" (12/11/2011)  . Vitamin D deficiency 12/2018    Past Surgical History: Past Surgical History:  Procedure Laterality Date  . ANTERIOR CERVICAL DECOMP/DISCECTOMY FUSION N/A 01/08/2015   Procedure: Anterior Cervical Three-Four/Four-Five Decompression/Diskectomy/Fusion;  Surgeon: Leeroy Cha, MD;  Location: [facility];  Service: Neurosurgery;  Laterality: N/A;  C3-4 C4-5 Anterior cervical decompression/diskectomy/fusion  . APPENDECTOMY  ~ 2004  . CATARACT EXTRACTION     right eye  . COLONOSCOPY    . ENUCLEATION  2001   "right"  . ESOPHAGOGASTRODUODENOSCOPY (EGD) WITH PROPOFOL N/A 04/21/2012   Procedure: ESOPHAGOGASTRODUODENOSCOPY (EGD) WITH PROPOFOL;  Surgeon: Milus Banister, MD;  Location: [facility] ENDOSCOPY;  Service: Endoscopy;  Laterality: N/A;  . 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 [place]; 1999; 2005   "right"  . MULTIPLE TOOTH EXTRACTIONS    . TONSILLECTOMY    . TOTAL NEPHRECTOMY  1988?; 1994; 2005    Current Medications: No outpatient medications have been marked as taking for the 02/06/19 encounter (Office Visit) with Audie Box, Cassie Freer, MD.     Allergies:    Levofloxacin   Social History: Social History   Socioeconomic History  . Marital status: Single    Spouse name: Not on file  . Number of children: 0  . Years of education: some college  . Highest education level: Not on file  Occupational History  . Occupation: disabled    Fish farm manager: UNEMPLOYED  Tobacco Use  . Smoking status: Current Every Day Smoker    Packs/day: 0.20    Years: 28.00    Pack years: 5.60    Types: Cigarettes  . Smokeless tobacco: Never Used  . Tobacco comment: had quit 03/05/2011  Substance and Sexual Activity  . Alcohol use: Yes    Comment: rare  . Drug use: Yes    Types: Marijuana    Comment:  occasional use  . Sexual activity: Yes  Other Topics Concern  . Not on file  Social History Narrative   Right-handed.   Four cups caffeine per day.   Lives alone.   Social Determinants of Health   Financial Resource Strain:   . Difficulty of Paying Living Expenses: Not on file  Food Insecurity:   . Worried About Charity fundraiser in the Last Year: Not on file  . Ran Out of Food in the Last Year: Not on file  Transportation Needs:   . Lack of Transportation (Medical): Not on file  . Lack of Transportation (Non-Medical): Not on file  Physical Activity:   .  Days of Exercise per Week: Not on file  . Minutes of Exercise per Session: Not on file  Stress:   . Feeling of Stress : Not on file  Social Connections:   . Frequency of Communication with Friends and Family: Not on file  . Frequency of Social Gatherings with Friends and Family: Not on file  . Attends Religious Services: Not on file  . Active Member of Clubs or Organizations: Not on file  . Attends Archivist Meetings: Not on file  . Marital Status: Not on file     Family History: The patient's family history includes Glaucoma in her mother; Hypertension in her maternal grandmother; Multiple sclerosis in her brother; Pancreatic cancer in her father. There is no history of Breast cancer.  ROS:   All other ROS reviewed and negative. Pertinent positives noted in the HPI.     EKGs/Labs/Other Studies Reviewed:   The following studies were personally reviewed by me today:  TTE 01/25/2019  1. Left ventricular ejection fraction, by visual estimation, is 60 to 65%. The left ventricle has normal function. There is moderately increased left ventricular hypertrophy.  2. Left ventricular diastolic parameters are consistent with Grade III diastolic dysfunction (restrictive).  3. Elevated left ventricular end-diastolic pressure.  4. Global right ventricle has normal systolic function.The right ventricular size is normal. No  increase in right ventricular wall thickness.  5. Moderately elevated pulmonary artery systolic pressure.  6. The tricuspid regurgitant velocity is 3.22 m/s, and with an assumed right atrial pressure of 3 mmHg, the estimated right ventricular systolic pressure is moderately elevated at 44.6 mmHg.  7. Left atrial size was normal.  8. Right atrial size was normal.  9. Trivial pericardial effusion is present. No findings to suggest constrictive physiology. 10. The mitral valve is normal in structure. Trivial mitral valve regurgitation. No evidence of mitral stenosis. 11. The tricuspid valve is normal in structure. Moderate TR. 12. The aortic valve has an indeterminant number of cusps. Aortic valve regurgitation is not visualized. Mild aortic valve sclerosis without stenosis. 13. The pulmonic valve was normal in structure. Pulmonic valve regurgitation is trivial. 14. The inferior vena cava is normal in size with greater than 50% respiratory variability, suggesting right atrial pressure of 3 mmHg. 15. The left ventricle has no regional wall motion abnormalities.  EKG:  EKG is ordered today.  The ekg ordered today demonstrates normal sinus rhythm, heart rate 64, Q waves in the lateral leads, no acute ischemic changes, and was personally reviewed by me.   Recent Labs: 08/25/2018: B Natriuretic Peptide 104.8; Platelets 151 12/06/2018: TSH 0.878 12/30/2018: ALT 15; BUN 31; Creatinine, Ser 2.21; Potassium 4.6; Sodium 142 01/30/2019: Hemoglobin 10.3   Recent Lipid Panel    Component Value Date/Time   CHOL 214 (H) 12/06/2018 1052   TRIG 118 12/06/2018 1052   HDL 89 12/06/2018 1052   CHOLHDL 2.4 12/06/2018 1052   CHOLHDL 2.9 08/23/2008 0604   VLDL 21 08/23/2008 0604   LDLCALC 105 (H) 12/06/2018 1052    Physical Exam:   VS:  BP 97/61 (BP Location: Left Arm, Patient Position: Sitting, Cuff Size: Normal)   Pulse 64   Ht 4\' 11"  (1.499 m)   Wt 129 lb 12.8 oz (58.9 kg)   SpO2 99%   BMI 26.22 kg/m     Wt Readings from Last 3 Encounters:  02/06/19 129 lb 12.8 oz (58.9 kg)  01/04/19 128 lb (58.1 kg)  12/06/18 124 lb 9.6 oz (56.5 kg)  General: Well nourished, well developed, in no acute distress Heart: Atraumatic, normal size  Eyes: PEERLA, EOMI  Neck: Jugular venous distention noted to earlobes Endocrine: No thryomegaly Cardiac: Normal S1, S2; RRR; no murmurs, rubs, or gallops Lungs: Crackles at lung bases Abd: Soft, nontender, no hepatomegaly  Ext: 2+ pitting edema up to knees/thighs Musculoskeletal: No deformities, BUE and BLE strength normal and equal Skin: Warm and dry, no rashes   Neuro: Alert and oriented to person, place, time, and situation, CNII-XII grossly intact, no focal deficits  Psych: Normal mood and affect   ASSESSMENT:   Tina Watkins is a 54 y.o. female who presents for the following: 1. Chronic heart failure with preserved ejection fraction (HCC)   2. Other hypervolemia   3. Essential hypertension   4. Mixed hyperlipidemia   5. Superficial vein thrombosis     PLAN:   1. Chronic heart failure with preserved ejection fraction (HCC) 2. Other hypervolemia -She presents today grossly volume overloaded.  She has volume overload in the lower extremities as well as upper extremities.  Her picture is more consistent with vascular congestion not in SVC syndrome.  Really unclear why she would have an SVC syndrome.  She will actually need a CT scan to look for any evidence of obstruction but I have a low suspicion for this. -Her echocardiogram shows heart failure with preserved ejection fraction, grade 3 diastolic dysfunction.  This is a consequence of longstanding hypertension and CKD. -I recommend she go to the emergency room to be admitted for IV diuretic therapy as I do not think she will do well as an outpatient.  She reports for now she would like to increase her Lasix to 40 mg in the morning and 80 mg at night.  She states she will follow-up with her  nephrologist about further intervention. -I discussed with her that she will need to be closely monitored by nephrology while inpatient as she is a renal transplant patient.  The cardiology team can be around if needed however nephrology is really the major issue here. -We discussed reducing salt intake in better diet. -I will see her back in 3 months after she likely gets admitted for volume overload and diuretic therapy.  We can further discuss salt reduction strategies and blood pressure control.  3. Essential hypertension -Stable today no changes  4. Mixed hyperlipidemia -We will likely need to be on a statin and can discuss this when I see her back  Disposition: Return in about 3 months (around 05/07/2019).  Medication Adjustments/Labs and Tests Ordered: Current medicines are reviewed at length with the patient today.  Concerns regarding medicines are outlined above.  Orders Placed This Encounter  Procedures  . EKG 12-Lead   No orders of the defined types were placed in this encounter.   Patient Instructions  Medication Instructions:  Take 40mg  Lasix in the morning  Take 80mg  Lasix in the evening  If you need a refill on your cardiac medications before your next appointment, please call your pharmacy.   Lab work: NONE   Testing/Procedures: NONE  Follow-Up: At [facility], you and your health needs are our priority.  As part of our continuing mission to provide you with exceptional heart care, we have created designated Provider Care Teams.  These Care Teams include your primary Cardiologist (physician) and Advanced Practice Providers (APPs -  Physician Assistants and Nurse Practitioners) who all work together to provide you with the care you need, when you need it. You  may see Dr Audie Box or one of the following Advanced Practice Providers on your designated Care Team:    Almyra Deforest, PA-C  Fabian Sharp, [place] or   Roby Lofts, [place]  Your physician wants you to  follow-up in: 3 months with Dr Audie Box         Signed, Addison Naegeli. Audie Box, [place]  [address] [place], [place] [address] [phone]  02/06/2019 4:41 PM

## 2019-02-06 ENCOUNTER — Ambulatory Visit: Payer: Medicaid Other | Admitting: Cardiovascular Disease

## 2019-02-06 ENCOUNTER — Encounter: Payer: Self-pay | Admitting: Cardiovascular Disease

## 2019-02-06 ENCOUNTER — Other Ambulatory Visit: Payer: Self-pay

## 2019-02-06 VITALS — BP 97/61 | HR 64 | Ht 59.0 in | Wt 129.8 lb

## 2019-02-06 DIAGNOSIS — E8779 Other fluid overload: Secondary | ICD-10-CM | POA: Diagnosis not present

## 2019-02-06 DIAGNOSIS — E782 Mixed hyperlipidemia: Secondary | ICD-10-CM | POA: Diagnosis not present

## 2019-02-06 DIAGNOSIS — I10 Essential (primary) hypertension: Secondary | ICD-10-CM

## 2019-02-06 DIAGNOSIS — D492 Neoplasm of unspecified behavior of bone, soft tissue, and skin: Secondary | ICD-10-CM | POA: Diagnosis not present

## 2019-02-06 DIAGNOSIS — I5032 Chronic diastolic (congestive) heart failure: Secondary | ICD-10-CM

## 2019-02-06 DIAGNOSIS — I82890 Acute embolism and thrombosis of other specified veins: Secondary | ICD-10-CM

## 2019-02-06 NOTE — Patient Instructions (Signed)
Medication Instructions:  Take 40mg  Lasix in the morning  Take 80mg  Lasix in the evening  If you need a refill on your cardiac medications before your next appointment, please call your pharmacy.   Lab work: NONE   Testing/Procedures: NONE  Follow-Up: At [facility], you and your health needs are our priority.  As part of our continuing mission to provide you with exceptional heart care, we have created designated Provider Care Teams.  These Care Teams include your primary Cardiologist (physician) and Advanced Practice Providers (APPs -  Physician Assistants and Nurse Practitioners) who all work together to provide you with the care you need, when you need it. You may see Dr Audie Box or one of the following Advanced Practice Providers on your designated Care Team:    Almyra Deforest, PA-C  Fabian Sharp, [place] or   Roby Lofts, [place]  Your physician wants you to follow-up in: 3 months with Dr Audie Box

## 2019-02-08 DIAGNOSIS — J189 Pneumonia, unspecified organism: Secondary | ICD-10-CM | POA: Diagnosis not present

## 2019-02-08 DIAGNOSIS — D631 Anemia in chronic kidney disease: Secondary | ICD-10-CM | POA: Diagnosis not present

## 2019-02-08 DIAGNOSIS — Z79899 Other long term (current) drug therapy: Secondary | ICD-10-CM | POA: Diagnosis not present

## 2019-02-08 DIAGNOSIS — Z940 Kidney transplant status: Secondary | ICD-10-CM | POA: Diagnosis not present

## 2019-02-08 DIAGNOSIS — L908 Other atrophic disorders of skin: Secondary | ICD-10-CM | POA: Diagnosis not present

## 2019-02-08 DIAGNOSIS — M4722 Other spondylosis with radiculopathy, cervical region: Secondary | ICD-10-CM | POA: Diagnosis not present

## 2019-02-08 DIAGNOSIS — K3184 Gastroparesis: Secondary | ICD-10-CM | POA: Diagnosis not present

## 2019-02-08 DIAGNOSIS — T8611 Kidney transplant rejection: Secondary | ICD-10-CM | POA: Diagnosis not present

## 2019-02-08 DIAGNOSIS — N189 Chronic kidney disease, unspecified: Secondary | ICD-10-CM | POA: Diagnosis not present

## 2019-02-08 DIAGNOSIS — I5032 Chronic diastolic (congestive) heart failure: Secondary | ICD-10-CM | POA: Diagnosis not present

## 2019-02-08 DIAGNOSIS — M109 Gout, unspecified: Secondary | ICD-10-CM | POA: Diagnosis not present

## 2019-02-08 DIAGNOSIS — N179 Acute kidney failure, unspecified: Secondary | ICD-10-CM | POA: Diagnosis not present

## 2019-02-10 ENCOUNTER — Other Ambulatory Visit: Payer: Self-pay

## 2019-02-10 ENCOUNTER — Ambulatory Visit (HOSPITAL_COMMUNITY)
Admission: RE | Admit: 2019-02-10 | Discharge: 2019-02-10 | Disposition: A | Discharge status: Home / Self Care | Payer: Medicaid Other | Source: Ambulatory Visit | Attending: Nephrology | Admitting: Nephrology

## 2019-02-10 VITALS — BP 83/34 | HR 58 | Temp 95.2°F | Resp 18

## 2019-02-10 DIAGNOSIS — N185 Chronic kidney disease, stage 5: Secondary | ICD-10-CM | POA: Insufficient documentation

## 2019-02-10 DIAGNOSIS — D631 Anemia in chronic kidney disease: Secondary | ICD-10-CM | POA: Diagnosis present

## 2019-02-10 DIAGNOSIS — Z940 Kidney transplant status: Secondary | ICD-10-CM | POA: Insufficient documentation

## 2019-02-10 LAB — POCT HEMOGLOBIN-HEMACUE: Hemoglobin: 10.8 g/dL — ABNORMAL LOW (ref 12.0–15.0)

## 2019-02-10 LAB — COMPREHENSIVE METABOLIC PANEL
ALT: 13 U/L (ref 0–44)
AST: 20 U/L (ref 15–41)
Albumin: 3.9 g/dL (ref 3.5–5.0)
Alkaline Phosphatase: 102 U/L (ref 38–126)
Anion gap: 7 (ref 5–15)
BUN: 33 mg/dL — ABNORMAL HIGH (ref 6–20)
CO2: 26 mmol/L (ref 22–32)
Calcium: 8.9 mg/dL (ref 8.9–10.3)
Chloride: 106 mmol/L (ref 98–111)
Creatinine, Ser: 2.66 mg/dL — ABNORMAL HIGH (ref 0.44–1.00)
GFR calc Af Amer: 23 mL/min — ABNORMAL LOW (ref >60)
GFR calc non Af Amer: 20 mL/min — ABNORMAL LOW (ref >60)
Glucose, Bld: 95 mg/dL (ref 70–99)
Potassium: 4.3 mmol/L (ref 3.5–5.1)
Sodium: 139 mmol/L (ref 135–145)
Total Bilirubin: 0.9 mg/dL (ref 0.3–1.2)
Total Protein: 6.3 g/dL — ABNORMAL LOW (ref 6.5–8.1)

## 2019-02-10 LAB — IRON AND TIBC
Iron: 91 ug/dL (ref 28–170)
Saturation Ratios: 44 % — ABNORMAL HIGH (ref 10.4–31.8)
TIBC: 206 ug/dL — ABNORMAL LOW (ref 250–450)
UIBC: 115 ug/dL

## 2019-02-10 LAB — FERRITIN: Ferritin: 813 ng/mL — ABNORMAL HIGH (ref 11–307)

## 2019-02-10 LAB — PHOSPHORUS: Phosphorus: 4.3 mg/dL (ref 2.5–4.6)

## 2019-02-10 MED ORDER — EPOETIN ALFA 40000 UNIT/ML IJ SOLN: 40000.0000 [IU] | INTRAMUSCULAR | Status: DC

## 2019-02-10 MED ORDER — EPOETIN ALFA 40000 UNIT/ML IJ SOLN
INTRAMUSCULAR | Status: AC
  Administered 2019-02-10: 40000 [IU] via SUBCUTANEOUS
  Filled 2019-02-10: qty 1

## 2019-02-13 ENCOUNTER — Encounter (HOSPITAL_COMMUNITY): Payer: Medicaid Other

## 2019-02-13 IMAGING — CT CT CERVICAL SPINE W/O CM
2 of 5 series · 8 of 20 positions shown, 10 images · non-contrast
Comparison: 09/23/2016 MRI.  03/17/2015 cervical spine CT

CLINICAL DATA: Neck pain. Bilateral shoulder and arm pain for
months. Bilateral arm weakness.

EXAM:
CT CERVICAL SPINE WITHOUT CONTRAST
TECHNIQUE: Multidetector CT imaging of the cervical spine was performed without
intravenous contrast. Multiplanar CT image reconstructions were also
generated.

[Series 200: sag · sagittal · 0.39mm/px · 1 of 60 slices shown]
[im 30/60  bone]
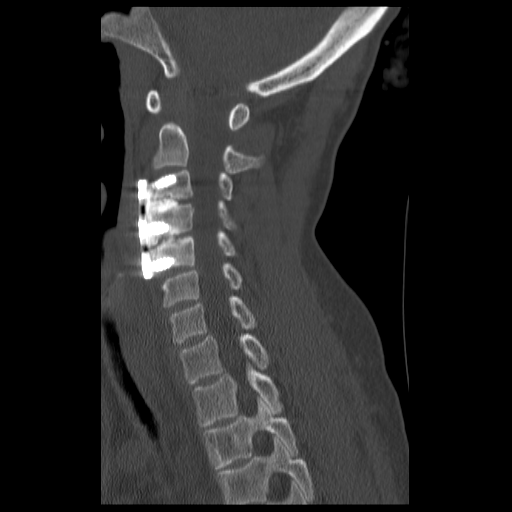

[Series 202: axial · axial · 0.20mm/px · z∈[+181,+322]mm · 7 of 275 slices shown, 9 images]
[im 35/275  soft-tissue]
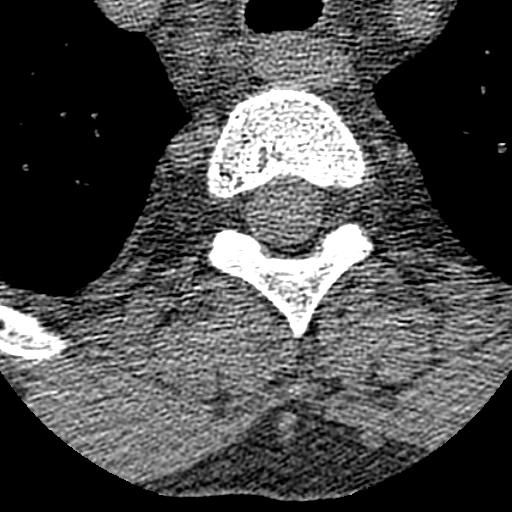
[im 35/275  bone]
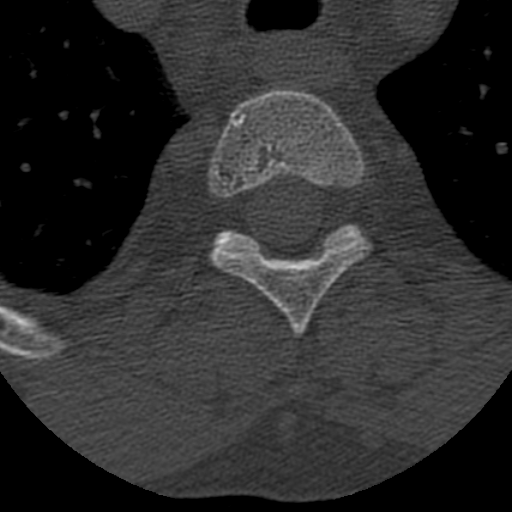
[im 69/275  bone]
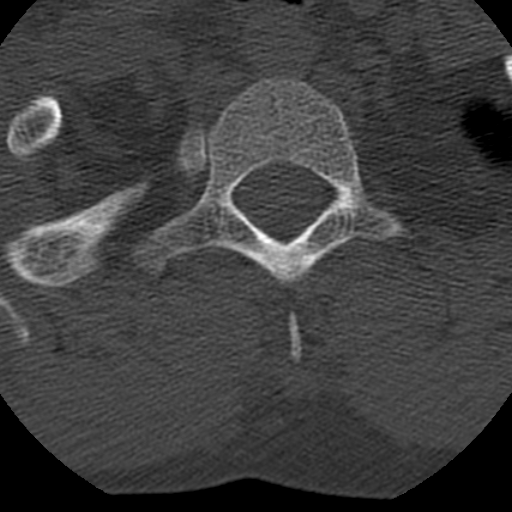
[im 103/275  bone]
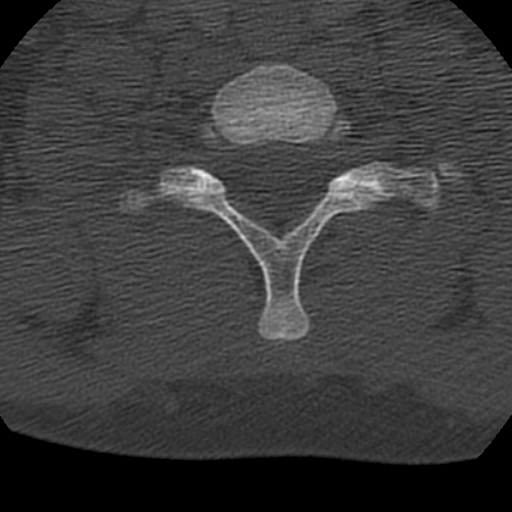
[im 138/275  bone]
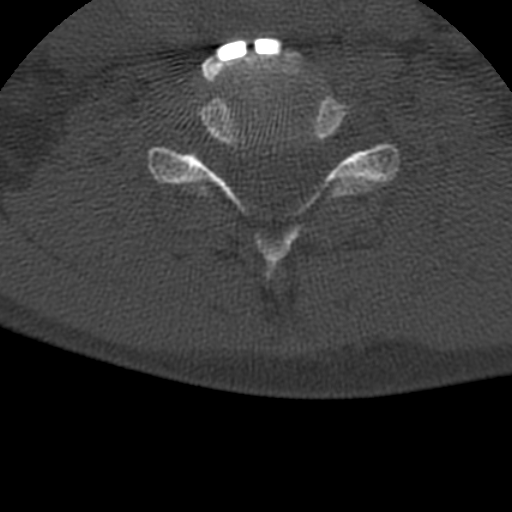
[im 172/275  soft-tissue]
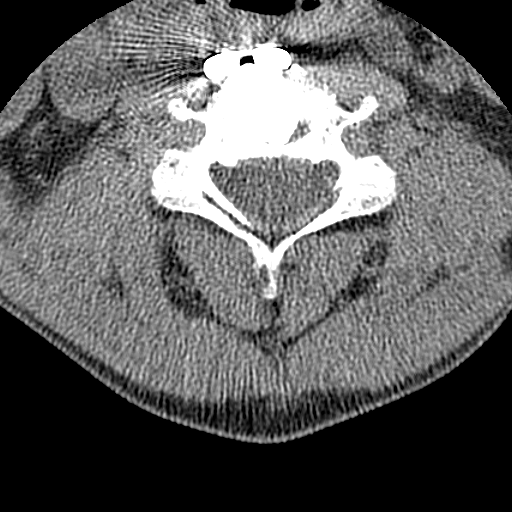
[im 172/275  bone]
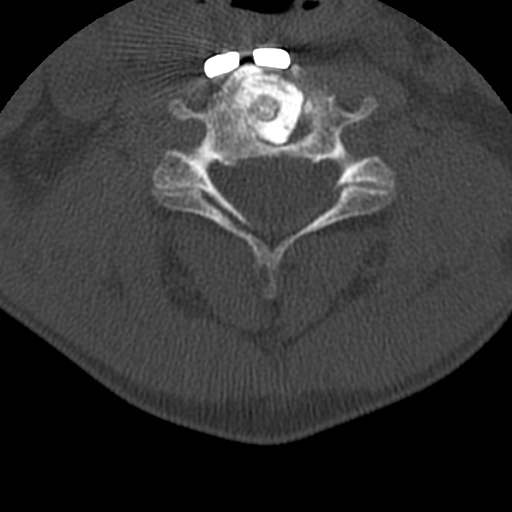
[im 206/275  bone]
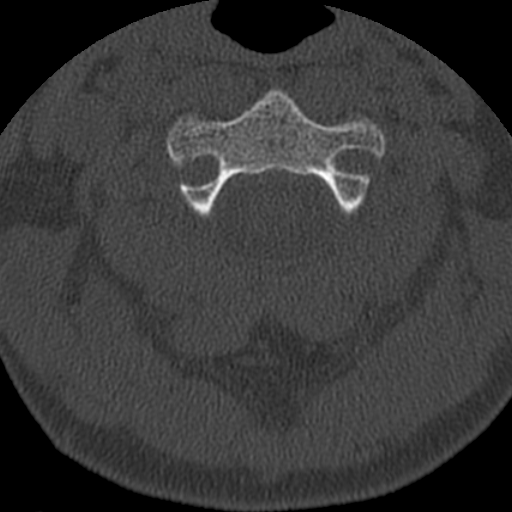
[im 240/275  bone]
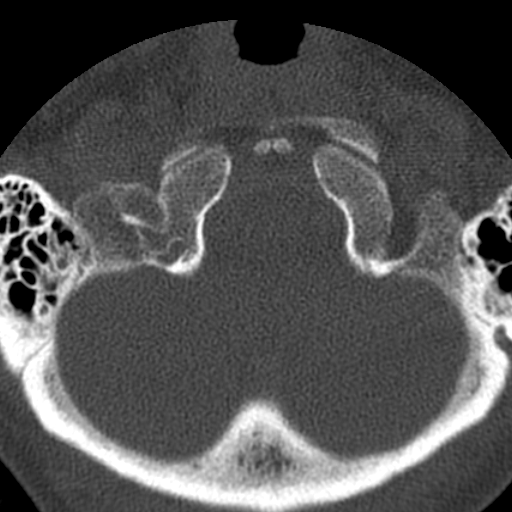

[8 of 20 positions shown; findings below may reference images not displayed]

FINDINGS: Alignment: Straightening without subluxation.

Skull base and vertebrae: No evidence of fracture, discitis, or
aggressive bone lesion. Probable hemangioma at the T3 body level.
Status post C3-4 and C4-5 ACDF with ventral plate. Solid bridging
bone is seen through the graft at C3-4. There is good incorporation
of bone graft into the inferior endplate of C4 and probable spicules
of bridging bone to the superior endplate of C5. No evidence of
hardware failure. Coarsening of trabecula diffusely which may be
related to end-stage renal disease status.

Soft tissues and spinal canal: No noted mass or swelling. Chronic
prominence of veins attributed to long-standing venous access.

Disc levels: Postoperative changes above. No noted degenerative
changes or impingement throughout the cervical spine.

Upper chest: No acute finding.Coarse calcification along right-sided
central vessels and the SVC attributed to old venous access. Patient
has end-stage renal disease. There is nonspecific calcification
along the left aspect of the upper thoracic esophagus that is stable
from 9340 chest CT.
IMPRESSION: 1. C3-4 ACDF with solid arthrodesis.
2. C4-5 ACDF with questionable small volume bony bridging. No
hardware failure.
3. No evidence of impingement throughout the cervical spine. No
notable degenerative changes.

## 2019-02-20 DIAGNOSIS — M2022 Hallux rigidus, left foot: Secondary | ICD-10-CM | POA: Diagnosis not present

## 2019-02-20 DIAGNOSIS — M7989 Other specified soft tissue disorders: Secondary | ICD-10-CM | POA: Diagnosis not present

## 2019-02-20 DIAGNOSIS — D492 Neoplasm of unspecified behavior of bone, soft tissue, and skin: Secondary | ICD-10-CM | POA: Diagnosis not present

## 2019-02-24 ENCOUNTER — Other Ambulatory Visit: Payer: Self-pay

## 2019-02-24 ENCOUNTER — Ambulatory Visit (HOSPITAL_COMMUNITY)
Admission: RE | Admit: 2019-02-24 | Discharge: 2019-02-24 | Disposition: A | Discharge status: Home / Self Care | Payer: Medicaid Other | Source: Ambulatory Visit | Attending: Nephrology | Admitting: Nephrology

## 2019-02-24 VITALS — BP 98/54 | HR 65 | Temp 97.5°F | Resp 18

## 2019-02-24 DIAGNOSIS — N185 Chronic kidney disease, stage 5: Secondary | ICD-10-CM | POA: Insufficient documentation

## 2019-02-24 DIAGNOSIS — Z940 Kidney transplant status: Secondary | ICD-10-CM | POA: Diagnosis not present

## 2019-02-24 DIAGNOSIS — D631 Anemia in chronic kidney disease: Secondary | ICD-10-CM | POA: Diagnosis present

## 2019-02-24 LAB — POCT HEMOGLOBIN-HEMACUE: Hemoglobin: 10.3 g/dL — ABNORMAL LOW (ref 12.0–15.0)

## 2019-02-24 MED ORDER — EPOETIN ALFA 40000 UNIT/ML IJ SOLN
INTRAMUSCULAR | Status: AC
  Filled 2019-02-24: qty 1

## 2019-02-24 MED ORDER — EPOETIN ALFA 40000 UNIT/ML IJ SOLN
40000.0000 [IU] | INTRAMUSCULAR | Status: DC
  Administered 2019-02-24: 40000 [IU] via SUBCUTANEOUS

## 2019-03-02 DIAGNOSIS — Z1159 Encounter for screening for other viral diseases: Secondary | ICD-10-CM | POA: Diagnosis not present

## 2019-03-02 DIAGNOSIS — E559 Vitamin D deficiency, unspecified: Secondary | ICD-10-CM | POA: Diagnosis not present

## 2019-03-02 DIAGNOSIS — Z940 Kidney transplant status: Secondary | ICD-10-CM | POA: Diagnosis not present

## 2019-03-02 DIAGNOSIS — Z79899 Other long term (current) drug therapy: Secondary | ICD-10-CM | POA: Diagnosis not present

## 2019-03-02 DIAGNOSIS — E7800 Pure hypercholesterolemia, unspecified: Secondary | ICD-10-CM | POA: Diagnosis not present

## 2019-03-07 ENCOUNTER — Telehealth (HOSPITAL_COMMUNITY): Payer: Self-pay

## 2019-03-07 ENCOUNTER — Ambulatory Visit: Payer: Medicaid Other | Admitting: Gastroenterology

## 2019-03-07 NOTE — Telephone Encounter (Signed)

## 2019-03-08 ENCOUNTER — Other Ambulatory Visit: Payer: Self-pay | Admitting: Physician Assistant

## 2019-03-08 ENCOUNTER — Encounter: Payer: Self-pay | Admitting: Physician Assistant

## 2019-03-08 ENCOUNTER — Ambulatory Visit (INDEPENDENT_AMBULATORY_CARE_PROVIDER_SITE_OTHER): Payer: Medicaid Other | Admitting: Physician Assistant

## 2019-03-08 ENCOUNTER — Ambulatory Visit (HOSPITAL_COMMUNITY)
Admission: RE | Admit: 2019-03-08 | Discharge: 2019-03-08 | Disposition: A | Discharge status: Home / Self Care | Payer: Medicaid Other | Source: Ambulatory Visit | Attending: Internal Medicine | Admitting: Internal Medicine

## 2019-03-08 ENCOUNTER — Encounter (HOSPITAL_COMMUNITY): Payer: Self-pay | Admitting: Internal Medicine

## 2019-03-08 ENCOUNTER — Other Ambulatory Visit: Payer: Self-pay

## 2019-03-08 VITALS — BP 102/58 | HR 64 | Wt 127.8 lb

## 2019-03-08 VITALS — BP 90/50 | HR 56 | Temp 97.9°F | Ht <= 58 in | Wt 131.2 lb

## 2019-03-08 DIAGNOSIS — H409 Unspecified glaucoma: Secondary | ICD-10-CM | POA: Insufficient documentation

## 2019-03-08 DIAGNOSIS — E559 Vitamin D deficiency, unspecified: Secondary | ICD-10-CM | POA: Insufficient documentation

## 2019-03-08 DIAGNOSIS — Z940 Kidney transplant status: Secondary | ICD-10-CM | POA: Insufficient documentation

## 2019-03-08 DIAGNOSIS — Z881 Allergy status to other antibiotic agents status: Secondary | ICD-10-CM | POA: Diagnosis not present

## 2019-03-08 DIAGNOSIS — F17210 Nicotine dependence, cigarettes, uncomplicated: Secondary | ICD-10-CM | POA: Insufficient documentation

## 2019-03-08 DIAGNOSIS — Z7952 Long term (current) use of systemic steroids: Secondary | ICD-10-CM | POA: Diagnosis not present

## 2019-03-08 DIAGNOSIS — Z820 Family history of epilepsy and other diseases of the nervous system: Secondary | ICD-10-CM | POA: Insufficient documentation

## 2019-03-08 DIAGNOSIS — I5022 Chronic systolic (congestive) heart failure: Secondary | ICD-10-CM

## 2019-03-08 DIAGNOSIS — R1900 Intra-abdominal and pelvic swelling, mass and lump, unspecified site: Secondary | ICD-10-CM | POA: Diagnosis not present

## 2019-03-08 DIAGNOSIS — Z8711 Personal history of peptic ulcer disease: Secondary | ICD-10-CM | POA: Diagnosis not present

## 2019-03-08 DIAGNOSIS — G629 Polyneuropathy, unspecified: Secondary | ICD-10-CM | POA: Insufficient documentation

## 2019-03-08 DIAGNOSIS — I130 Hypertensive heart and chronic kidney disease with heart failure and stage 1 through stage 4 chronic kidney disease, or unspecified chronic kidney disease: Secondary | ICD-10-CM | POA: Diagnosis not present

## 2019-03-08 DIAGNOSIS — M797 Fibromyalgia: Secondary | ICD-10-CM | POA: Diagnosis not present

## 2019-03-08 DIAGNOSIS — Z7982 Long term (current) use of aspirin: Secondary | ICD-10-CM | POA: Insufficient documentation

## 2019-03-08 DIAGNOSIS — N184 Chronic kidney disease, stage 4 (severe): Secondary | ICD-10-CM | POA: Diagnosis not present

## 2019-03-08 DIAGNOSIS — K219 Gastro-esophageal reflux disease without esophagitis: Secondary | ICD-10-CM | POA: Insufficient documentation

## 2019-03-08 DIAGNOSIS — Z79899 Other long term (current) drug therapy: Secondary | ICD-10-CM | POA: Diagnosis not present

## 2019-03-08 DIAGNOSIS — Z8673 Personal history of transient ischemic attack (TIA), and cerebral infarction without residual deficits: Secondary | ICD-10-CM | POA: Insufficient documentation

## 2019-03-08 DIAGNOSIS — R198 Other specified symptoms and signs involving the digestive system and abdomen: Secondary | ICD-10-CM | POA: Diagnosis not present

## 2019-03-08 DIAGNOSIS — K3184 Gastroparesis: Secondary | ICD-10-CM | POA: Insufficient documentation

## 2019-03-08 DIAGNOSIS — Z800 Family history of malignant neoplasm of digestive organs: Secondary | ICD-10-CM | POA: Insufficient documentation

## 2019-03-08 DIAGNOSIS — H5461 Unqualified visual loss, right eye, normal vision left eye: Secondary | ICD-10-CM | POA: Diagnosis not present

## 2019-03-08 DIAGNOSIS — I5032 Chronic diastolic (congestive) heart failure: Secondary | ICD-10-CM

## 2019-03-08 DIAGNOSIS — I10 Essential (primary) hypertension: Secondary | ICD-10-CM | POA: Diagnosis not present

## 2019-03-08 DIAGNOSIS — M109 Gout, unspecified: Secondary | ICD-10-CM | POA: Diagnosis not present

## 2019-03-08 LAB — BASIC METABOLIC PANEL
Anion gap: 10 (ref 5–15)
BUN: 46 mg/dL — ABNORMAL HIGH (ref 6–20)
CO2: 20 mmol/L — ABNORMAL LOW (ref 22–32)
Calcium: 9.2 mg/dL (ref 8.9–10.3)
Chloride: 109 mmol/L (ref 98–111)
Creatinine, Ser: 3 mg/dL — ABNORMAL HIGH (ref 0.44–1.00)
GFR calc Af Amer: 20 mL/min — ABNORMAL LOW (ref >60)
GFR calc non Af Amer: 17 mL/min — ABNORMAL LOW (ref >60)
Glucose, Bld: 90 mg/dL (ref 70–99)
Potassium: 5 mmol/L (ref 3.5–5.1)
Sodium: 139 mmol/L (ref 135–145)

## 2019-03-08 LAB — BRAIN NATRIURETIC PEPTIDE: B Natriuretic Peptide: 250.9 pg/mL — ABNORMAL HIGH (ref 0.0–100.0)

## 2019-03-08 MED ORDER — POTASSIUM CHLORIDE CRYS ER 20 MEQ PO TBCR: 20.0000 meq | EXTENDED_RELEASE_TABLET | ORAL | 4 refills | Status: DC

## 2019-03-08 MED ORDER — TORSEMIDE 20 MG PO TABS: 60.0000 mg | ORAL_TABLET | Freq: Every day | ORAL | 4 refills | Status: DC

## 2019-03-08 MED ORDER — METOLAZONE 2.5 MG PO TABS: 2.5000 mg | ORAL_TABLET | ORAL | 5 refills | Status: DC

## 2019-03-08 NOTE — Progress Notes (Addendum)
ADVANCED HF CLINIC CONSULT NOTE  Referring Physician: Coladonato Primary Cardiologist: O'Neal   HPI:  Tina Watkins is a 54 y.o. female with a hx of gout, gastroparesis, CVA, ESRD status post renal transplant, heart failure with preserved ejection fraction, hypertension who is being seen today for the evaluation of congestive heart failure at the request of Dr. Marval Regal.   In December gained a lot of fluid (~30 pounds) over the holidays and was referred to Dr. Audie Box. He recommended IV diuresis but she did not want to be admitted. Saw Dr. Marval Regal who switched to torsemide. She does not recall dose but says she is taking 1 in the morning and 1 at night. (likely 20 bid). No real effect. Weight down 3 pounds 130 -> 127. (baseline 112).   Baseline creatinine 2.0-2.5. last check 2.66 in 02/10/19  Says she is watching what she eats and drinks. Says she is eating a lot of [employer] chicken and lemonade and fries. SOB with exertion. Uses a scooter when she goes. Out. No orthopnea or PND. + swelling in belly and legs. Smoking 1/2 ppd.  Dr. Audie Box was concerned about potential venous obstruction but unable to do CT   Echo 12/20   1. Left ventricular ejection fraction, by visual estimation, is 60 to  65%. The left ventricle has normal function. There is moderately increased  left ventricular hypertrophy.  2. Left ventricular diastolic parameters are consistent with Grade III  diastolic dysfunction (restrictive).  3. Elevated left ventricular end-diastolic pressure.  4. Global right ventricle has normal systolic function.The right  ventricular size is normal. No increase in right ventricular wall  thickness.  5. Moderately elevated pulmonary artery systolic pressure.  6. The tricuspid regurgitant velocity is 3.22 m/s, and with an assumed  right atrial pressure of 3 mmHg, the estimated right ventricular systolic  pressure is moderately elevated at 44.6 mmHg.  7. The mitral  valve is normal in structure. Trivial mitral valve  regurgitation. No evidence of mitral stenosis.  8. The tricuspid valve is normal in structure. Moderate TR.  9. The aortic valve has an indeterminant number of cusps. Aortic valve  regurgitation is not visualized. Mild aortic valve sclerosis without  stenosis.    Review of Systems: [y] = yes, [ ]  = no   General: Weight gain Blue.Reese ]; Weight loss [ ] ; Anorexia [ ] ; Fatigue Blue.Reese ]; Fever [ ] ; Chills [ ] ; Weakness [ ]   Cardiac: Chest pain/pressure [ ] ; Resting SOB Blue.Reese ]; Exertional SOB [ y]; Orthopnea [ ] ; Pedal Edema [ ] ; Palpitations [ ] ; Syncope [ ] ; Presyncope [ ] ; Paroxysmal nocturnal dyspnea[ ]   Pulmonary: Cough Blue.Reese ]; Wheezing[ ] ; Hemoptysis[ ] ; Sputum [ ] ; Snoring [ ]   GI: Vomiting[ ] ; Dysphagia[ ] ; Melena[ ] ; Hematochezia [ ] ; Heartburn[ ] ; Abdominal pain Blue.Reese ]; Constipation [ ] ; Diarrhea [ ] ; BRBPR [ ]   GU: Hematuria[ ] ; Dysuria [ ] ; Nocturia[ ]   Vascular: Pain in legs with walking Blue.Reese ]; Pain in feet with lying flat [ ] ; Non-healing sores [ ] ; Stroke [ y]; TIA [ ] ; Slurred speech [ ] ;  Neuro: Headaches[ ] ; Vertigo[ ] ; Seizures[ ] ; Paresthesias[ ] ;Blurred vision [ ] ; Diplopia [ ] ; Vision changes [ ]   Ortho/Skin: Arthritis Blue.Reese ]; Joint pain Blue.Reese ]; Muscle pain [ ] ; Joint swelling [ ] ; Back Pain [ ] ; Rash [ ]   Psych: Depression[ ] ; Anxiety[ ]   Heme: Bleeding problems [ ] ; Clotting disorders [ ] ; Anemia Blue.Reese ]  Endocrine: Diabetes [ ] ;  Thyroid dysfunction[ ]    Past Medical History:  Diagnosis Date  . Bacteremia due to Gram-negative bacteria 05/23/2011  . Blind    right eye  . Blind right eye   . Chronic lower back pain   . Complication of anesthesia    "woke up during OR; I have an extremely high tolerance" (12/11/2011) 1 procedure was graft; the other procedures were procedures that are typically done with sedation.  . DDD (degenerative disc disease), cervical   . Depression   . Dysrhythmia    "tachycardia" (12/11/2011) new onset afib 10/15/14  EKG  . E coli bacteremia 06/18/2011  . Elevated LDL cholesterol level 12/2018  . ESRD (end stage renal disease) ([place]) 06/12/2011  . Fibromyalgia   . Gastroparesis   . Gastroparesis   . GERD (gastroesophageal reflux disease)   . Glaucoma    right eye  . Gout   . H/O carpal tunnel syndrome   . Headache(784.0)    "not often anymore" (12/11/2011)  . Herpes genitalia 1994  . History of blood transfusion    "more than a few times" (12/11/2011)  . History of stomach ulcers   . Hypotension   . Iron deficiency anemia   . New onset a-fib ([place])    10/15/14 EKG  . Osteopenia   . Peripheral neuropathy 11/2018  . Seizures ([place]) 1994   "post transplant; only have had that one" (12/11/2011)  . Spinal stenosis in cervical region   . Stroke [facility])     left basal ganglia lacunar infarct; Right frontal lobe lacunar infarct.  . Stroke [facility]) ~ 1999; 2001   "briefly lost my vision; lost my right eye" (12/11/2011)  . Vitamin D deficiency 12/2018    Current Outpatient Medications  Medication Sig Dispense Refill  . aspirin EC 81 MG tablet Take 81 mg by mouth daily.     [name] azaTHIOprine (IMURAN) 50 MG tablet Take 100 mg by mouth daily.     . colchicine 0.6 MG tablet Take 0.6-1.2 mg by mouth See admin instructions. Take two tablets by mouth at onset, then take one tablet by mouth three times daily as needed for gout    . Darbepoetin Alfa (ARANESP, ALBUMIN FREE, IJ) Inject as directed every 14 (fourteen) days.    . fluticasone (FLONASE) 50 MCG/ACT nasal spray Place 1 spray into both nostrils daily. 16 g 0  . furosemide (LASIX) 40 MG tablet Take 40 mg by mouth at bedtime.   2  . gabapentin (NEURONTIN) 100 MG capsule 1 (ONE) CAPSULE TWO TIMES DAILY, AS NEEDED, MAX DAILY DOSE  2 CAPSULE    . latanoprost (XALATAN) 0.005 % ophthalmic solution Place 1 drop into the left eye at bedtime.  3  . metoCLOPramide (REGLAN) 10 MG tablet Take 1 tablet (10 mg total) by mouth every 8 (eight) hours as needed for nausea. 40 tablet  0  . metoCLOPramide (REGLAN) 5 MG tablet TAKE 1 TABLET BY MOUTH TWICE A DAY 180 tablet 0  . omeprazole (PRILOSEC) 40 MG capsule Take 1 capsule (40 mg total) by mouth daily. (Patient taking differently: Take 40 mg by mouth 2 (two) times daily. ) 40 capsule 0  . ondansetron (ZOFRAN ODT) 4 MG disintegrating tablet Take 1 tablet (4 mg total) by mouth every 6 (six) hours as needed. 20 tablet 0  . oxyCODONE-acetaminophen (PERCOCET) 10-325 MG tablet Take 1 tablet by mouth every 6 (six) hours as needed for pain.    . predniSONE (DELTASONE) 5 MG tablet Take 5 mg  by mouth daily.    [name] PROVENTIL HFA 108 (90 Base) MCG/ACT inhaler Inhale 2 puffs into the lungs 4 (four) times daily as needed for shortness of breath.  2  . tacrolimus (PROGRAF) 1 MG capsule Take 2-3 mg by mouth See admin instructions. Take 3 capsules every morning and take 2 capsules every evening    . Vitamin D, Ergocalciferol, (DRISDOL) 1.25 MG (50000 UT) CAPS capsule Take 1 capsule (50,000 Units total) by mouth every 7 (seven) days. 5 capsule 6  . zolpidem (AMBIEN) 10 MG tablet Take 10 mg by mouth at bedtime as needed for sleep.     No current facility-administered medications for this encounter.    Allergies  Allergen Reactions  . Levofloxacin Itching and Rash      Social History   Socioeconomic History  . Marital status: Single    Spouse name: Not on file  . Number of children: 0  . Years of education: some college  . Highest education level: Not on file  Occupational History  . Occupation: disabled    Fish farm manager: UNEMPLOYED  Tobacco Use  . Smoking status: Current Every Day Smoker    Packs/day: 0.20    Years: 28.00    Pack years: 5.60    Types: Cigarettes  . Smokeless tobacco: Never Used  . Tobacco comment: had quit 03/05/2011  Substance and Sexual Activity  . Alcohol use: Yes    Comment: rare  . Drug use: Yes    Types: Marijuana    Comment: occasional use  . Sexual activity: Yes  Other Topics Concern  . Not on file    Social History Narrative   Right-handed.   Four cups caffeine per day.   Lives alone.   Social Determinants of Health   Financial Resource Strain:   . Difficulty of Paying Living Expenses: Not on file  Food Insecurity:   . Worried About Charity fundraiser in the Last Year: Not on file  . Ran Out of Food in the Last Year: Not on file  Transportation Needs:   . Lack of Transportation (Medical): Not on file  . Lack of Transportation (Non-Medical): Not on file  Physical Activity:   . Days of Exercise per Week: Not on file  . Minutes of Exercise per Session: Not on file  Stress:   . Feeling of Stress : Not on file  Social Connections:   . Frequency of Communication with Friends and Family: Not on file  . Frequency of Social Gatherings with Friends and Family: Not on file  . Attends Religious Services: Not on file  . Active Member of Clubs or Organizations: Not on file  . Attends Archivist Meetings: Not on file  . Marital Status: Not on file  Intimate Partner Violence:   . Fear of Current or Ex-Partner: Not on file  . Emotionally Abused: Not on file  . Physically Abused: Not on file  . Sexually Abused: Not on file      Family History  Problem Relation Age of Onset  . Glaucoma Mother   . Pancreatic cancer Father   . Multiple sclerosis Brother   . Hypertension Maternal Grandmother   . Breast cancer Neg Hx     Vitals:   03/08/19 0942  BP: (!) 102/58  Pulse: 64  SpO2: 99%  Weight: 58 kg (127 lb 12.8 oz)    PHYSICAL EXAM: General:  Well appearing. No respiratory difficulty HEENT: normal R eye opacified   Neck: supple. JVP  9-10 Carotids 2+ bilat; no bruits. No lymphadenopathy or thryomegaly appreciated. Cor: PMI nondisplaced. Regular rate & rhythm. 2/6 TR Lungs: clear Abdomen: soft, nontender, nondistended. No hepatosplenomegaly. No bruits or masses. Good bowel sounds. Extremities: no cyanosis, clubbing, rash, 3+ woody edema into thighs  edema Neuro:  alert & oriented x 3, cranial nerves grossly intact. moves all 4 extremities w/o difficulty. Affect pleasant.   ASSESSMENT & PLAN:  1. 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 Grade 3 DD - has R>L symptoms  But RV normal on echo - marked LE edema on exam. Lung water only mildly elevated REDS 38% - suspect dietary indiscretion a big aprt - Increase torsemide to 60 daily - add metolazone 2.5 M & F with 20 K. See back next week to reassess  - D/w Dr. Myrtie Hawk  - Place TED hose. Limit salt and fluid intake  - if no improvement will need RHC - suspect she is heading back toward HD in not so distant future to control volume status but we will do our best to help extend the interval as much as possible   3. Essential hypertension - Blood pressure well controlled. Continue current regimen.  3. Tobacco use - smoking 1/2 PPD encouraged her to quit  4. CKD 4 - s/p previous renal transplant. - followed by Dr. Marval Regal  - last creatinine 2.7. Repeat today  Glori Bickers, MD  10:17 AM

## 2019-03-08 NOTE — Patient Instructions (Addendum)
If you are age 52 or older, your body mass index should be between 23-30. Your Body mass index is 27.67 kg/m. If this is out of the aforementioned range listed, please consider follow up with your Primary Care Provider.  If you are age 51 or younger, your body mass index should be between 19-25. Your Body mass index is 27.67 kg/m. If this is out of the aformentioned range listed, please consider follow up with your Primary Care Provider.   You have been scheduled for an abdominal ultrasound with Doppler at [facility] on 03/15/2019 @845  am. Please arrive 15 minutes prior to your appointment for registration. Make certain not to have anything to eat or drink 6 hours prior to your appointment. Should you need to reschedule your appointment, please contact radiology at [phone]. This test typically takes about 30 minutes to perform.  Please have your labwork done on 03/10/2019  Due to recent changes in healthcare laws, you may see the results of your imaging and laboratory studies on MyChart before your provider has had a chance to review them.  We understand that in some cases there may be results that are confusing or concerning to you. Not all laboratory results come back in the same time frame and the provider may be waiting for multiple results in order to interpret others.  Please give [name] 48 hours in order for your provider to thoroughly review all the results before contacting the office for clarification of your results.    Gastroparesis  Gastroparesis is a condition in which food takes longer than normal to empty from the stomach. The condition is usually long-lasting (chronic). It may also be called delayed gastric emptying. There is no cure, but there are treatments and things that you can do at home to help relieve symptoms. Treating the underlying condition that causes gastroparesis can also help relieve symptoms. What are the causes? In many cases, the cause of this condition is  not known. Possible causes include:  A hormone (endocrine) disorder, such as hypothyroidism or diabetes.  A nervous system disease, such as Parkinson's disease or multiple sclerosis.  Cancer, infection, or surgery that affects the stomach or vagus nerve. The vagus nerve runs from your chest, through your neck, to the lower part of your brain.  A connective tissue disorder, such as scleroderma.  Certain medicines. What increases the risk? You are more likely to develop this condition if you:  Have certain disorders or diseases, including: ? An endocrine disorder. ? An eating disorder. ? Amyloidosis. ? Scleroderma. ? Parkinson's disease. ? Multiple sclerosis. ? Cancer or infection of the stomach or the vagus nerve.  Have had surgery on the stomach or vagus nerve.  Take certain medicines.  Are female. What are the signs or symptoms? Symptoms of this condition include:  Feeling full after eating very little.  Nausea.  Vomiting.  Heartburn.  Abdominal bloating.  Inconsistent blood sugar (glucose) levels on blood tests.  Lack of appetite.  Weight loss.  Acid from the stomach coming up into the esophagus (gastroesophageal reflux).  Sudden tightening (spasm) of the stomach, which can be painful. Symptoms may come and go. Some people may not notice any symptoms. How is this diagnosed? This condition is diagnosed with tests, such as:  Tests that check how long it takes food to move through the stomach and intestines. These tests include: ? Upper gastrointestinal (GI) series. For this test, you drink a liquid that shows up well on X-rays, and then X-rays  will be taken of your intestines. ? Gastric emptying scintigraphy. For this test, you eat food that contains a small amount of radioactive material, and then scans are taken. ? Wireless capsule GI monitoring system. For this test, you swallow a pill (capsule) that records information about how foods and fluid move  through your stomach.  Gastric manometry. For this test, a tube is passed down your throat and into your stomach to measure electrical and muscular activity.  Endoscopy. For this test, a long, thin tube is passed down your throat and into your stomach to check for problems in your stomach lining.  Ultrasound. This test uses sound waves to create images of inside the body. This can help rule out gallbladder disease or pancreatitis as a cause of your symptoms. How is this treated? There is no cure for gastroparesis. Treatment may include:  Treating the underlying cause.  Managing your symptoms by making changes to your diet and exercise habits.  Taking medicines to control nausea and vomiting and to stimulate stomach muscles.  Getting food through a feeding tube in the hospital. This may be done in severe cases.  Having surgery to insert a device into your body that helps improve stomach emptying and control nausea and vomiting (gastric neurostimulator). Follow these instructions at home:  Take over-the-counter and prescription medicines only as told by your health care provider.  Follow instructions from your health care provider about eating or drinking restrictions. Your health care provider may recommend that you: ? Eat smaller meals more often. ? Eat low-fat foods. ? Eat low-fiber forms of high-fiber foods. For example, eat cooked vegetables instead of raw vegetables. ? Have only liquid foods instead of solid foods. Liquid foods are easier to digest.  Drink enough fluid to keep your urine pale yellow.  Exercise as often as told by your health care provider.  Keep all follow-up visits as told by your health care provider. This is important. Contact a health care provider if you:  Notice that your symptoms do not improve with treatment.  Have new symptoms. Get help right away if you:  Have severe abdominal pain that does not improve with treatment.  Have nausea that is  severe or does not go away.  Cannot drink fluids without vomiting. Summary  Gastroparesis is a chronic condition in which food takes longer than normal to empty from the stomach.  Symptoms include nausea, vomiting, heartburn, abdominal bloating, and loss of appetite.  Eating smaller portions, and low-fat, low-fiber foods may help you manage your symptoms.  Get help right away if you have severe abdominal pain. This information is not intended to replace advice given to you by your health care provider. Make sure you discuss any questions you have with your health care provider. Document Revised: 04/19/2017 Document Reviewed: 11/24/2016 Elsevier Patient Education  2020 [employer].

## 2019-03-08 NOTE — Patient Instructions (Addendum)
INCREASE Torsemide to 60mg  (3 tabs) daily  START Metolazone 2.5mg  (1 tab) on Mondays and Fridayss.  (Take first dose today)  START Potassium 23meq (1 tab) on Mondays and Fridays. (Take first dose today)  Labs today We will only contact you if something comes back abnormal or we need to make some changes. Otherwise no news is good news!  You have been given a script for Support Hose.  Please take this to [facility] on [address].  Your physician recommends that you schedule a follow-up appointment in: 1 week with Dr Haroldine Laws  Please call office at [phone] option 2 if you have any questions or concerns.   At the [facility], you and your health needs are our priority. As part of our continuing mission to provide you with exceptional heart care, we have created designated Provider Care Teams. These Care Teams include your primary Cardiologist (physician) and Advanced Practice Providers (APPs- Physician Assistants and Nurse Practitioners) who all work together to provide you with the care you need, when you need it.   You may see any of the following providers on your designated Care Team at your next follow up: [name] Dr Glori Bickers . Dr Loralie Champagne . Darrick Grinder, NP . Lyda Jester, PA . Audry Riles, PharmD   Please be sure to bring in all your medications bottles to every appointment.

## 2019-03-08 NOTE — Progress Notes (Signed)
ReDS Vest / Clip - 03/08/19 1000      ReDS Vest / Clip   Station Marker  A  (Pended)     Ruler Value  26  (Pended)     ReDS Value Range  Moderate volume overload  (Pended)     ReDS Actual Value  38  (Pended)     Anatomical Comments  sitting

## 2019-03-08 NOTE — Progress Notes (Signed)
Subjective:    Patient ID: Tina Watkins, female    DOB: 1965/08/20, 54 y.o.   MRN: [id number]  HPI Tina Watkins is a pleasant 54 year old African-American female, established with Dr. Ardis Hughs, who was last seen in March 2020 with complaints of chronic nausea and intermittent vomiting.  This was felt to be multifactorial with chronic narcotic use underlying diabetes mellitus and gastroparesis.  She has been on metoclopramide 5 mg twice daily which has been helpful. She comes in today with complaints of abdominal swelling and increase in abdominal size over the past 2 months.  This has been associated with decrease in oral intake and inability to eat regular amounts.  She feels that she did have a flare of gastroparesis about 2 weeks ago which has since resolved and wonders if this is somehow correlated with her Aranesp injections.  Bowel movements have been normal. Patient has history of prior CVA, adult onset diabetes mellitus, fibromyalgia, end-stage renal disease status post renal transplant for which she is on Prograf.  Also with chronic GERD prior history of DVT. She has  congestive heart failure, however with preserved EF. She had a 20 pound plus weight gain onset in December..  Her diuretics were increased and she was seen in the heart failure clinic earlier today with increase in dose of torsemide and metaxalone was added to her regimen. She says since starting the diuretic she is down about 3 pounds and her abdomen feels softer and less tense.  She remains concerned about obstruction in her abdomen.  Review of Systems Pertinent positive and negative review of systems were noted in the above HPI section.  All other review of systems was otherwise negative.  Outpatient Encounter Medications as of 03/08/2019  Medication Sig  . aspirin EC 81 MG tablet Take 81 mg by mouth daily.   [name] azaTHIOprine (IMURAN) 50 MG tablet Take 100 mg by mouth daily.   . colchicine 0.6 MG tablet Take 0.6-1.2 mg  by mouth See admin instructions. Take two tablets by mouth at onset, then take one tablet by mouth three times daily as needed for gout  . Darbepoetin Alfa (ARANESP, ALBUMIN FREE, IJ) Inject as directed every 14 (fourteen) days.  . fluticasone (FLONASE) 50 MCG/ACT nasal spray Place 1 spray into both nostrils daily.  [name] gabapentin (NEURONTIN) 100 MG capsule 1 (ONE) CAPSULE TWO TIMES DAILY, AS NEEDED, MAX DAILY DOSE  2 CAPSULE  . latanoprost (XALATAN) 0.005 % ophthalmic solution Place 1 drop into the left eye at bedtime.  . lidocaine (LIDODERM) 5 % Place 1 patch onto the skin as needed.  . metoCLOPramide (REGLAN) 10 MG tablet Take 1 tablet (10 mg total) by mouth every 8 (eight) hours as needed for nausea.  . metoCLOPramide (REGLAN) 5 MG tablet TAKE 1 TABLET BY MOUTH TWICE A DAY  . [START ON 03/09/2019] metolazone (ZAROXOLYN) 2.5 MG tablet Take 1 tablet (2.5 mg total) by mouth 2 (two) times a week. On Monday and Friday  . omeprazole (PRILOSEC) 40 MG capsule Take 1 capsule (40 mg total) by mouth daily. (Patient taking differently: Take 40 mg by mouth 2 (two) times daily. )  . ondansetron (ZOFRAN ODT) 4 MG disintegrating tablet Take 1 tablet (4 mg total) by mouth every 6 (six) hours as needed.  [name] oxyCODONE-acetaminophen (PERCOCET) 10-325 MG tablet Take 1 tablet by mouth every 6 (six) hours as needed for pain.  Derrill Memo ON 03/09/2019] potassium chloride SA (KLOR-CON) 20 MEQ tablet Take 1 tablet (20 mEq total)  by mouth 2 (two) times a week. On Mondays and Fridays  . predniSONE (DELTASONE) 5 MG tablet Take 5 mg by mouth daily.  [name] PROVENTIL HFA 108 (90 Base) MCG/ACT inhaler Inhale 2 puffs into the lungs 4 (four) times daily as needed for shortness of breath.  . tacrolimus (PROGRAF) 1 MG capsule Take 2-3 mg by mouth See admin instructions. Take 3 capsules every morning and take 2 capsules every evening  . torsemide (DEMADEX) 20 MG tablet Take 3 tablets (60 mg total) by mouth daily.  . Vitamin D, Ergocalciferol,  (DRISDOL) 1.25 MG (50000 UT) CAPS capsule Take 1 capsule (50,000 Units total) by mouth every 7 (seven) days.  [name] zolpidem (AMBIEN) 10 MG tablet Take 10 mg by mouth at bedtime as needed for sleep.   No facility-administered encounter medications on file as of 03/08/2019.   Allergies  Allergen Reactions  . Levofloxacin Itching and Rash   Patient Active Problem List   Diagnosis Date Noted  . Swollen abdomen 01/04/2019  . DDD (degenerative disc disease), cervical 12/06/2018  . Neuropathy 12/06/2018  . Paresthesia 10/11/2018  . Neck pain 10/11/2018  . Tobacco abuse 11/20/2017  . Right leg swelling 11/20/2017  . CKD (chronic kidney disease), stage III 11/20/2017  . Right leg pain 11/20/2017  . Stroke (cerebrum) ([place]) 11/20/2017  . GERD (gastroesophageal reflux disease) 11/20/2017  . Acute venous embolism and thrombosis of deep vessels of proximal lower extremity, right ([place]) 11/20/2017  . 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 Thrush, oral   . AKI (acute kidney injury) ([place])   . Multifocal pneumonia 08/09/2017  . Chest pain 09/29/2015  . Cervical stenosis of spinal canal 01/08/2015  . Urinary tract infectious disease   . Muscle spasms of neck 06/15/2014  . Bleeding hemorrhoid 06/15/2014  . Anemia in chronic kidney disease 06/15/2014  . Acute on chronic renal failure ([place]) 06/13/2014  . Pyelonephritis, acute 06/13/2014  . Sepsis ([place]) 06/13/2014  . History of kidney transplant   . Chronic pain syndrome 04/09/2012  . Dehydration, mild 04/09/2012  . Gout attack 06/23/2011  . Herpes infection 06/23/2011  . Anxiety 06/23/2011  . E coli bacteremia 06/18/2011  . ESRD (end stage renal disease) ([place]) 06/12/2011  . UTI (lower urinary tract infection) 06/12/2011  . Bacteremia due to Gram-negative bacteria 05/23/2011  . History of renal transplantation 05/22/2011  . Septic shock(785.52) 05/22/2011  . Acute on chronic kidney failure ([place])  05/22/2011  . Gastroparesis 04/24/2008  . WEIGHT LOSS 08/24/2007  . NAUSEA AND VOMITING 08/24/2007   Social History   Socioeconomic History  . Marital status: Single    Spouse name: Not on file  . Number of children: 0  . Years of education: some college  . Highest education level: Not on file  Occupational History  . Occupation: disabled    Fish farm manager: UNEMPLOYED  Tobacco Use  . Smoking status: Current Every Day Smoker    Packs/day: 0.20    Years: 28.00    Pack years: 5.60    Types: Cigarettes  . Smokeless tobacco: Never Used  . Tobacco comment: had quit 03/05/2011  Substance and Sexual Activity  . Alcohol use: Yes    Comment: rare  . Drug use: Yes    Types: Marijuana    Comment: occasional use  . Sexual activity: Yes  Other Topics Concern  . Not on file  Social History Narrative   Right-handed.   Four cups caffeine per  day.   Lives alone.   Social Determinants of Health   Financial Resource Strain:   . Difficulty of Paying Living Expenses: Not on file  Food Insecurity:   . Worried About Charity fundraiser in the Last Year: Not on file  . Ran Out of Food in the Last Year: Not on file  Transportation Needs:   . Lack of Transportation (Medical): Not on file  . Lack of Transportation (Non-Medical): Not on file  Physical Activity:   . Days of Exercise per Week: Not on file  . Minutes of Exercise per Session: Not on file  Stress:   . Feeling of Stress : Not on file  Social Connections:   . Frequency of Communication with Friends and Family: Not on file  . Frequency of Social Gatherings with Friends and Family: Not on file  . Attends Religious Services: Not on file  . Active Member of Clubs or Organizations: Not on file  . Attends Archivist Meetings: Not on file  . Marital Status: Not on file  Intimate Partner Violence:   . Fear of Current or Ex-Partner: Not on file  . Emotionally Abused: Not on file  . Physically Abused: Not on file  . Sexually  Abused: Not on file    Tina Watkins's family history includes Glaucoma in her mother; Hypertension in her maternal grandmother; Multiple sclerosis in her brother; Pancreatic cancer in her father.      Objective:    Vitals:   03/08/19 1354  BP: (!) 90/50  Pulse: (!) 56  Temp: 97.9 F (36.6 C)    Physical Exam Well-developed well-nourished chronically ill-appearing petite African-American female in no acute distress.  Height, 4 foot 9 inches weight, 131 BMI 27.6  HEENT; nontraumatic normocephalic, EOMI, PER R LA, sclera anicteric. Oropharynx; not examined Neck; supple, no JVD Cardiovascular; regular rate and rhythm with S1-S2, no murmur rub or gallop Pulmonary; Clear bilaterally Abdomen; soft, protuberant, nontense, no focal tenderness positive fluid wave, no palpable mass or hepatosplenomegaly, bowel sounds are active, right lateral incisional scar Rectal; not done today Skin; benign exam, no jaundice rash or appreciable lesions Extremities; 2+ edema from the feet to the upper thighs Neuro/Psych; alert and oriented x4, grossly nonfocal mood and affect appropriate       Assessment & Plan:   #53  54 year old African-American female with significant volume overload , severe lower extremity edema to the thighs, and weight gain of 20+ pounds over the past 2 months in setting of history of congestive heart failure with preserved EF. She developed increase in abdominal girth/abdominal swelling over the 2 months as well. Suspect she has ascites secondary to volume overload.  She has already had some improvement in symptoms with diuresis of just a few pounds.  #2 underlying chronic gastroparesis-fairly stable #3 end-stage renal disease status post renal transplant/on Prograf #4 history of CVA 5.  Fibromyalgia 6.  Prior history of DVT 7.  GERD  Plan; will schedule for upper abdominal ultrasound with Doppler. Labs from today were reviewed, she has had a bump in creatinine.  Will  repeat BMP on Friday, 03/10/2019.  She will need close follow-up by cardiology and nephrology. Continue metoclopramide 5 mg p.o. twice daily, continue Zofran 4 mg every 6 hours as needed and omeprazole 40 mg p.o. every morning. Further GI recommendations pending results of ultrasound.   Amy Genia Harold PA-C 03/08/2019   Cc: [name], FNP

## 2019-03-09 NOTE — Progress Notes (Signed)
I agree with the above note, plan 

## 2019-03-10 ENCOUNTER — Encounter (HOSPITAL_COMMUNITY): Payer: Medicaid Other

## 2019-03-10 ENCOUNTER — Encounter (HOSPITAL_COMMUNITY)
Admission: RE | Admit: 2019-03-10 | Discharge: 2019-03-10 | Disposition: A | Discharge status: Home / Self Care | Payer: Medicaid Other | Source: Ambulatory Visit | Attending: Nephrology | Admitting: Nephrology

## 2019-03-10 ENCOUNTER — Other Ambulatory Visit: Payer: Self-pay

## 2019-03-10 VITALS — BP 87/51 | HR 67 | Temp 97.2°F | Resp 18

## 2019-03-10 DIAGNOSIS — N185 Chronic kidney disease, stage 5: Secondary | ICD-10-CM | POA: Diagnosis present

## 2019-03-10 DIAGNOSIS — Z940 Kidney transplant status: Secondary | ICD-10-CM | POA: Insufficient documentation

## 2019-03-10 DIAGNOSIS — H2512 Age-related nuclear cataract, left eye: Secondary | ICD-10-CM | POA: Diagnosis not present

## 2019-03-10 DIAGNOSIS — H401122 Primary open-angle glaucoma, left eye, moderate stage: Secondary | ICD-10-CM | POA: Diagnosis not present

## 2019-03-10 DIAGNOSIS — D631 Anemia in chronic kidney disease: Secondary | ICD-10-CM | POA: Diagnosis present

## 2019-03-10 LAB — POCT HEMOGLOBIN-HEMACUE: Hemoglobin: 9.8 g/dL — ABNORMAL LOW (ref 12.0–15.0)

## 2019-03-10 MED ORDER — EPOETIN ALFA 40000 UNIT/ML IJ SOLN
INTRAMUSCULAR | Status: AC
  Filled 2019-03-10: qty 1

## 2019-03-10 MED ORDER — EPOETIN ALFA 20000 UNIT/ML IJ SOLN
40000.0000 [IU] | INTRAMUSCULAR | Status: DC
  Administered 2019-03-10: 08:00:00 40000 [IU] via SUBCUTANEOUS

## 2019-03-14 ENCOUNTER — Telehealth (HOSPITAL_COMMUNITY): Payer: Self-pay

## 2019-03-14 DIAGNOSIS — H5213 Myopia, bilateral: Secondary | ICD-10-CM | POA: Diagnosis not present

## 2019-03-14 NOTE — Telephone Encounter (Signed)
COVID-19 pre-appointment screening questions:   Do you have a history of COVID-19 or a positive test result in the past 7-10 days? NO  To the best of your knowledge, have you been in close contact with anyone with a confirmed diagnosis of COVID 19?NO  Have you had any one or more of the following: Fever, chills, cough, shortness of breath (out of the normal for you) or any flu-like symptoms?YES;PATIENT WAS CHANGED TO VIRTUAL VISIT  Are you experiencing any of the following symptoms that is new or out of usual for you:  Ear, nose or throat discomfort  Sore throat  Headache  Muscle Pain  Diarrhea  Loss of taste or smell   Reviewed all the following with patient:   Use of hand sanitizer when entering the building  Everyone is required to wear a mask in the building, if you do not have a mask we are happy to provide you with one when you arrive  NO Visitor guidelines   If patient answers YES to any of questions they must change to a virtual visit and place note in comments about symptoms

## 2019-03-15 ENCOUNTER — Other Ambulatory Visit: Payer: Medicaid Other

## 2019-03-15 ENCOUNTER — Other Ambulatory Visit: Payer: Self-pay

## 2019-03-15 ENCOUNTER — Encounter (HOSPITAL_COMMUNITY): Payer: Self-pay | Admitting: Internal Medicine

## 2019-03-15 ENCOUNTER — Ambulatory Visit (HOSPITAL_COMMUNITY)
Admission: RE | Admit: 2019-03-15 | Discharge: 2019-03-15 | Disposition: A | Discharge status: Home / Self Care | Payer: Medicaid Other | Source: Ambulatory Visit | Attending: Internal Medicine | Admitting: Internal Medicine

## 2019-03-15 VITALS — BP 90/52 | HR 68 | Wt 131.0 lb

## 2019-03-15 DIAGNOSIS — Z881 Allergy status to other antibiotic agents status: Secondary | ICD-10-CM | POA: Diagnosis not present

## 2019-03-15 DIAGNOSIS — K219 Gastro-esophageal reflux disease without esophagitis: Secondary | ICD-10-CM | POA: Diagnosis not present

## 2019-03-15 DIAGNOSIS — Z820 Family history of epilepsy and other diseases of the nervous system: Secondary | ICD-10-CM | POA: Insufficient documentation

## 2019-03-15 DIAGNOSIS — I5022 Chronic systolic (congestive) heart failure: Secondary | ICD-10-CM

## 2019-03-15 DIAGNOSIS — G629 Polyneuropathy, unspecified: Secondary | ICD-10-CM | POA: Diagnosis not present

## 2019-03-15 DIAGNOSIS — Z8249 Family history of ischemic heart disease and other diseases of the circulatory system: Secondary | ICD-10-CM | POA: Insufficient documentation

## 2019-03-15 DIAGNOSIS — I4891 Unspecified atrial fibrillation: Secondary | ICD-10-CM | POA: Diagnosis not present

## 2019-03-15 DIAGNOSIS — I5032 Chronic diastolic (congestive) heart failure: Secondary | ICD-10-CM | POA: Insufficient documentation

## 2019-03-15 DIAGNOSIS — M8580 Other specified disorders of bone density and structure, unspecified site: Secondary | ICD-10-CM | POA: Insufficient documentation

## 2019-03-15 DIAGNOSIS — Z79899 Other long term (current) drug therapy: Secondary | ICD-10-CM | POA: Insufficient documentation

## 2019-03-15 DIAGNOSIS — Z7952 Long term (current) use of systemic steroids: Secondary | ICD-10-CM | POA: Insufficient documentation

## 2019-03-15 DIAGNOSIS — F17210 Nicotine dependence, cigarettes, uncomplicated: Secondary | ICD-10-CM | POA: Insufficient documentation

## 2019-03-15 DIAGNOSIS — Z8673 Personal history of transient ischemic attack (TIA), and cerebral infarction without residual deficits: Secondary | ICD-10-CM | POA: Insufficient documentation

## 2019-03-15 DIAGNOSIS — E559 Vitamin D deficiency, unspecified: Secondary | ICD-10-CM | POA: Diagnosis not present

## 2019-03-15 DIAGNOSIS — Z800 Family history of malignant neoplasm of digestive organs: Secondary | ICD-10-CM | POA: Diagnosis not present

## 2019-03-15 DIAGNOSIS — Z7982 Long term (current) use of aspirin: Secondary | ICD-10-CM | POA: Insufficient documentation

## 2019-03-15 DIAGNOSIS — N184 Chronic kidney disease, stage 4 (severe): Secondary | ICD-10-CM

## 2019-03-15 DIAGNOSIS — Z940 Kidney transplant status: Secondary | ICD-10-CM | POA: Insufficient documentation

## 2019-03-15 DIAGNOSIS — I130 Hypertensive heart and chronic kidney disease with heart failure and stage 1 through stage 4 chronic kidney disease, or unspecified chronic kidney disease: Secondary | ICD-10-CM | POA: Diagnosis not present

## 2019-03-15 DIAGNOSIS — I10 Essential (primary) hypertension: Secondary | ICD-10-CM

## 2019-03-15 DIAGNOSIS — M797 Fibromyalgia: Secondary | ICD-10-CM | POA: Insufficient documentation

## 2019-03-15 DIAGNOSIS — Z8711 Personal history of peptic ulcer disease: Secondary | ICD-10-CM | POA: Insufficient documentation

## 2019-03-15 LAB — BASIC METABOLIC PANEL
Anion gap: 10 (ref 5–15)
BUN: 38 mg/dL — ABNORMAL HIGH (ref 6–20)
CO2: 24 mmol/L (ref 22–32)
Calcium: 9.5 mg/dL (ref 8.9–10.3)
Chloride: 105 mmol/L (ref 98–111)
Creatinine, Ser: 3.01 mg/dL — ABNORMAL HIGH (ref 0.44–1.00)
GFR calc Af Amer: 20 mL/min — ABNORMAL LOW (ref >60)
GFR calc non Af Amer: 17 mL/min — ABNORMAL LOW (ref >60)
Glucose, Bld: 99 mg/dL (ref 70–99)
Potassium: 4.3 mmol/L (ref 3.5–5.1)
Sodium: 139 mmol/L (ref 135–145)

## 2019-03-15 LAB — BRAIN NATRIURETIC PEPTIDE: B Natriuretic Peptide: 204.4 pg/mL — ABNORMAL HIGH (ref 0.0–100.0)

## 2019-03-15 NOTE — Progress Notes (Signed)
ReDS Vest / Clip - 03/15/19 1600      ReDS Vest / Clip   Station Marker  A    Ruler Value  27    ReDS Value Range  (!) High volume overload    ReDS Actual Value  46    Anatomical Comments  sitting

## 2019-03-15 NOTE — Patient Instructions (Addendum)
Labs today We will only contact you if something comes back abnormal or we need to make some changes. Otherwise no news is good news!  Dr Haroldine Laws will speak with your renal (kidney) doctor to decide when to bring you in for further work up. You will be called to arrange admission.  Your physician recommends that you schedule a follow-up appointment in: 4 weeks with Dr Haroldine Laws   At the [facility], you and your health needs are our priority. As part of our continuing mission to provide you with exceptional heart care, we have created designated Provider Care Teams. These Care Teams include your primary Cardiologist (physician) and Advanced Practice Providers (APPs- Physician Assistants and Nurse Practitioners) who all work together to provide you with the care you need, when you need it.   You may see any of the following providers on your designated Care Team at your next follow up: [name] Dr Glori Bickers . Dr Loralie Champagne . Darrick Grinder, NP . Lyda Jester, PA . Audry Riles, PharmD   Please be sure to bring in all your medications bottles to every appointment.

## 2019-03-15 NOTE — Progress Notes (Signed)
[facility] CLIN[facility]  NOTE  Referring Physician: [place] Primary Cardiologist: O'Neal   HPI:  Tina Watkins is a 54 y.o. female with a hx of gout, gastroparesis, CVA, ESRD status post renal transplant, heart failure with preserved ejection fraction, hypertension who is being seen today for the evaluation of congestive heart failure at the request of Dr. Marval Regal.   In December gained a lot of fluid (~30 pounds) over the holidays and was referred to Dr. Audie Box. He recommended IV diuresis but she did not want to be admitted. Saw Dr. Marval Regal who switched to torsemide. She does not recall dose but says she is taking 1 in the morning and 1 at night. (likely 20 bid). No real effect. Weight down 3 pounds 130 -> 127. (baseline 112).   I saw her last week and has moderate volume overload. I spoke with Dr. Marval Regal and we increased torsemide from 20 bid to 60 daily and added metolazone 2.5 M & F with 20 K. She took the torsemide 60mg  daily x 1 but made her sick so back to 20 bid. Took metolazone x 1 and did not notice any improvement.Creatinine recently 2.2 -> 2.6 -> 3.0. She returns today for routine f/u. Weight up 4 pounds. Denies feeling any worse. Says she feels sluggish. Fatigues when walking Avoiding [employer]. Ate [place] food.  Uses a scooter when she goes. Still smoking 1/3 ppd.  Echo 12/20   1. Left ventricular ejection fraction, by visual estimation, is 60 to  65%. The left ventricle has normal function. There is moderately increased  left ventricular hypertrophy.  2. Left ventricular diastolic parameters are consistent with Grade III  diastolic dysfunction (restrictive).  3. Elevated left ventricular end-diastolic pressure.  4. Global right ventricle has normal systolic function.The right  ventricular size is normal. No increase in right ventricular wall  thickness.  5. Moderately elevated pulmonary artery systolic pressure.  6. The tricuspid regurgitant velocity  is 3.22 m/s, and with an assumed  right atrial pressure of 3 mmHg, the estimated right ventricular systolic  pressure is moderately elevated at 44.6 mmHg.  7. The mitral valve is normal in structure. Trivial mitral valve  regurgitation. No evidence of mitral stenosis.  8. The tricuspid valve is normal in structure. Moderate TR.  9. The aortic valve has an indeterminant number of cusps. Aortic valve  regurgitation is not visualized. Mild aortic valve sclerosis without  stenosis.    Past Medical History:  Diagnosis Date  . Bacteremia due to Gram-negative bacteria 05/23/2011  . Blind    right eye  . Blind right eye   . CHF (congestive heart failure) ([place])   . Chronic lower back pain   . Complication of anesthesia    "woke up during OR; I have an extremely high tolerance" (12/11/2011) 1 procedure was graft; the other procedures were procedures that are typically done with sedation.  . DDD (degenerative disc disease), cervical   . Depression   . Dysrhythmia    "tachycardia" (12/11/2011) new onset afib 10/15/14 EKG  . E coli bacteremia 06/18/2011  . Elevated LDL cholesterol level 12/2018  . ESRD (end stage renal disease) ([place]) 06/12/2011  . Fibromyalgia   . Gastroparesis   . Gastroparesis   . GERD (gastroesophageal reflux disease)   . Glaucoma    right eye  . Gout   . H/O carpal tunnel syndrome   . Headache(784.0)    "not often anymore" (12/11/2011)  . Herpes genitalia 1994  . History of blood  transfusion    "more than a few times" (12/11/2011)  . History of stomach ulcers   . Hypotension   . Iron deficiency anemia   . New onset a-fib ([place])    10/15/14 EKG  . Osteopenia   . Peripheral neuropathy 11/2018  . Seizures ([place]) 1994   "post transplant; only have had that one" (12/11/2011)  . Spinal stenosis in cervical region   . Stroke [facility])     left basal ganglia lacunar infarct; Right frontal lobe lacunar infarct.  . Stroke [facility]) ~ 1999; 2001   "briefly lost my vision; lost my right  eye" (12/11/2011)  . Vitamin D deficiency 12/2018    Current Outpatient Medications  Medication Sig Dispense Refill  . aspirin EC 81 MG tablet Take 81 mg by mouth daily.     [name] azaTHIOprine (IMURAN) 50 MG tablet Take 100 mg by mouth daily.     . colchicine 0.6 MG tablet Take 0.6-1.2 mg by mouth See admin instructions. Take two tablets by mouth at onset, then take one tablet by mouth three times daily as needed for gout    . Darbepoetin Alfa (ARANESP, ALBUMIN FREE, IJ) Inject as directed every 14 (fourteen) days.    . fluticasone (FLONASE) 50 MCG/ACT nasal spray Place 1 spray into both nostrils daily as needed for allergies or rhinitis.    [name] gabapentin (NEURONTIN) 100 MG capsule 1 (ONE) CAPSULE TWO TIMES DAILY, AS NEEDED, MAX DAILY DOSE  2 CAPSULE    . latanoprost (XALATAN) 0.005 % ophthalmic solution Place 1 drop into the left eye at bedtime.  3  . lidocaine (LIDODERM) 5 % Place 1 patch onto the skin as needed.    . metoCLOPramide (REGLAN) 10 MG tablet Take 1 tablet (10 mg total) by mouth every 8 (eight) hours as needed for nausea. 40 tablet 0  . metoCLOPramide (REGLAN) 5 MG tablet TAKE 1 TABLET BY MOUTH TWICE A DAY 180 tablet 0  . metolazone (ZAROXOLYN) 2.5 MG tablet Take 1 tablet (2.5 mg total) by mouth 2 (two) times a week. On Monday and Friday 10 tablet 5  . omeprazole (PRILOSEC) 40 MG capsule Take 40 mg by mouth 2 (two) times daily.    . ondansetron (ZOFRAN ODT) 4 MG disintegrating tablet Take 1 tablet (4 mg total) by mouth every 6 (six) hours as needed. 20 tablet 0  . oxyCODONE-acetaminophen (PERCOCET) 10-325 MG tablet Take 1 tablet by mouth every 6 (six) hours as needed for pain.    . potassium chloride SA (KLOR-CON) 20 MEQ tablet Take 1 tablet (20 mEq total) by mouth 2 (two) times a week. On Mondays and Fridays 10 tablet 4  . predniSONE (DELTASONE) 5 MG tablet Take 5 mg by mouth daily.    [name] PROVENTIL HFA 108 (90 Base) MCG/ACT inhaler Inhale 2 puffs into the lungs 4 (four) times daily as  needed for shortness of breath.  2  . tacrolimus (PROGRAF) 1 MG capsule Take 2-3 mg by mouth See admin instructions. Take 3 capsules every morning and take 2 capsules every evening    . torsemide (DEMADEX) 20 MG tablet Take 3 tablets (60 mg total) by mouth daily. 90 tablet 4  . Vitamin D, Ergocalciferol, (DRISDOL) 1.25 MG (50000 UT) CAPS capsule Take 1 capsule (50,000 Units total) by mouth every 7 (seven) days. 5 capsule 6  . zolpidem (AMBIEN) 10 MG tablet Take 10 mg by mouth at bedtime as needed for sleep.     No current facility-administered medications for  this encounter.    Allergies  Allergen Reactions  . Levofloxacin Itching and Rash      Social History   Socioeconomic History  . Marital status: Single    Spouse name: Not on file  . Number of children: 0  . Years of education: some college  . Highest education level: Not on file  Occupational History  . Occupation: disabled    Fish farm manager: UNEMPLOYED  Tobacco Use  . Smoking status: Current Every Day Smoker    Packs/day: 0.20    Years: 28.00    Pack years: 5.60    Types: Cigarettes  . Smokeless tobacco: Never Used  . Tobacco comment: had quit 03/05/2011  Substance and Sexual Activity  . Alcohol use: Yes    Comment: rare  . Drug use: Yes    Types: Marijuana    Comment: occasional use  . Sexual activity: Yes  Other Topics Concern  . Not on file  Social History Narrative   Right-handed.   Four cups caffeine per day.   Lives alone.   Social Determinants of Health   Financial Resource Strain:   . Difficulty of Paying Living Expenses: Not on file  Food Insecurity:   . Worried About Charity fundraiser in the Last Year: Not on file  . Ran Out of Food in the Last Year: Not on file  Transportation Needs:   . Lack of Transportation (Medical): Not on file  . Lack of Transportation (Non-Medical): Not on file  Physical Activity:   . Days of Exercise per Week: Not on file  . Minutes of Exercise per Session: Not on file    Stress:   . Feeling of Stress : Not on file  Social Connections:   . Frequency of Communication with Friends and Family: Not on file  . Frequency of Social Gatherings with Friends and Family: Not on file  . Attends Religious Services: Not on file  . Active Member of Clubs or Organizations: Not on file  . Attends Archivist Meetings: Not on file  . Marital Status: Not on file  Intimate Partner Violence:   . Fear of Current or Ex-Partner: Not on file  . Emotionally Abused: Not on file  . Physically Abused: Not on file  . Sexually Abused: Not on file      Family History  Problem Relation Age of Onset  . Glaucoma Mother   . Pancreatic cancer Father   . Multiple sclerosis Brother   . Hypertension Maternal Grandmother   . Breast cancer Neg Hx     Vitals:   03/15/19 1547  BP: (!) 90/52  Pulse: 68  SpO2: 98%  Weight: 59.4 kg (131 lb)   PHYSICAL EXAM: General:  Walked into clinic No resp difficulty HEENT: normal R eye opacified Neck: supple. JVP to jaw. Carotids 2+ bilat; no bruits. No lymphadenopathy or thryomegaly appreciated. Cor: PMI nondisplaced. Regular rate & rhythm. No rubs, gallops or murmurs. Lungs: clear Abdomen: soft, nontender, nondistended. No hepatosplenomegaly. No bruits or masses. Good bowel sounds. Extremities: no cyanosis, clubbing, rash, 3+ edema to hips Neuro: alert & orientedx3, cranial nerves grossly intact. moves all 4 extremities w/o difficulty. Affect pleasant  ASSESSMENT & PLAN:  1. 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 Grade 3 DD - has R>L symptoms  But RV normal on echo - marked LE edema on exam. Lung water only mildly elevated REDS 38% - suspect dietary indiscretion a big  aprt - not responding well to increased doses of oral diuretics. - D/w Dr. Myrtie Hawk will increase torsemide to 40 bid. Plan admit for IV diuresis next Monday.  - Watch renal function. Suspect she is  heading back toward HD - Continue TED hose.  2. Essential hypertension - Blood pressure low. May need midodrine  3. Tobacco use - smoking 1/2 PPD encouraged her to quit  4. CKD 4 - s/p previous renal transplant. - followed by Dr. Marval Regal  - last creatinine 2.7-> 3.0  - repeat today    Glori Bickers, MD  4:10 PM

## 2019-03-16 ENCOUNTER — Telehealth (HOSPITAL_COMMUNITY): Payer: Self-pay | Admitting: *Deleted

## 2019-03-16 NOTE — Telephone Encounter (Signed)
Per Dr Haroldine Laws pt needs direct admit to tele bed on Mon 2/16 under [facility].  Per admitting office [facility] calls the AM of admission to request bed so I would need to call Mon.  Pt is aware and agreeable of plan, she is aware she will contacted Mon when a bed is available for her, she will go tomorrow for COVID test.

## 2019-03-17 ENCOUNTER — Inpatient Hospital Stay (HOSPITAL_COMMUNITY): Admission: RE | Admit: 2019-03-17 | Payer: Medicaid Other | Source: Ambulatory Visit

## 2019-03-17 ENCOUNTER — Other Ambulatory Visit (HOSPITAL_COMMUNITY)
Admission: RE | Admit: 2019-03-17 | Discharge: 2019-03-17 | Disposition: A | Discharge status: Home / Self Care | Payer: Medicaid Other | Source: Ambulatory Visit | Attending: Internal Medicine | Admitting: Internal Medicine

## 2019-03-17 ENCOUNTER — Telehealth: Payer: Self-pay

## 2019-03-17 DIAGNOSIS — Z01812 Encounter for preprocedural laboratory examination: Secondary | ICD-10-CM | POA: Diagnosis not present

## 2019-03-17 DIAGNOSIS — Z20822 Contact with and (suspected) exposure to covid-19: Secondary | ICD-10-CM | POA: Insufficient documentation

## 2019-03-17 LAB — SARS CORONAVIRUS 2 (TAT 6-24 HRS): SARS Coronavirus 2: NEGATIVE

## 2019-03-17 NOTE — Telephone Encounter (Signed)
-----   Message from Darden Dates sent at 03/15/2019  3:16 PM EST ----- [phone] 1 Duplex scan of arterial inflow and venous outflow of abdominal, pelvic, scrotal contents, and/or retroperitoneal organs; complete study Denied Based on [employer] Abdomen Imaging Guidelines Section(s): AB 2.5 Epigastric Pain and Dyspepsia and Preface to the Imaging Guidelines, section Preface3 Clinical Information, we cannot approve this request. Your records show that you have belly (abdomen) pain. The reason this request cannot be approved is because: 1. Guidelines support a recent ultrasound for the clinical indication(s) presented. Ultrasound may help confirm the diagnosis or may help determine the most appropriate next imaging test. The requested procedure might be indicated when recent ultrasounds have been [address] [place], [place] [address] Fax: [phone] Phone: [phone] Case Request Results - Your Case have been Approved with some changes Patient [place] Referring Physician Requested Study / performed that are technically limited or non- diagnostic. The clinical information provided does not describe these results and, therefore, the request is not indicated at this time. 2. There is an approval code for the same study, or a similar study, that is on file in the [facility] records. This approval is still in effect. The clinical information submitted does not describe results of this prior imaging, or if there are plans to use this approval. The approved study may be sufficient for the evaluation of the current clinical condition. Guidelines do not support additional imaging without knowing the outcome of the prior approval and, therefore, the request is not indicated at this time. [id number] 1 Ultrasound, abdominal, B-scan and/or real time with image documentation; complete Approved

## 2019-03-17 NOTE — Telephone Encounter (Signed)
My understanding it that only the abdominal u/s is approved. The liver doppler is not approved.

## 2019-03-20 ENCOUNTER — Inpatient Hospital Stay (HOSPITAL_COMMUNITY)
Admission: AD | Admit: 2019-03-20 | Discharge: 2019-03-24 | DRG: 698 | Disposition: A | Discharge status: Home / Self Care | Payer: Medicaid Other | Source: Ambulatory Visit | Attending: Internal Medicine | Admitting: Internal Medicine

## 2019-03-20 ENCOUNTER — Inpatient Hospital Stay (HOSPITAL_COMMUNITY): Payer: Medicaid Other

## 2019-03-20 ENCOUNTER — Encounter (HOSPITAL_COMMUNITY): Payer: Self-pay | Admitting: Internal Medicine

## 2019-03-20 ENCOUNTER — Other Ambulatory Visit: Payer: Self-pay

## 2019-03-20 DIAGNOSIS — N183 Chronic kidney disease, stage 3 unspecified: Secondary | ICD-10-CM | POA: Diagnosis not present

## 2019-03-20 DIAGNOSIS — Z7952 Long term (current) use of systemic steroids: Secondary | ICD-10-CM

## 2019-03-20 DIAGNOSIS — M545 Low back pain: Secondary | ICD-10-CM | POA: Diagnosis present

## 2019-03-20 DIAGNOSIS — Z800 Family history of malignant neoplasm of digestive organs: Secondary | ICD-10-CM | POA: Diagnosis not present

## 2019-03-20 DIAGNOSIS — H409 Unspecified glaucoma: Secondary | ICD-10-CM | POA: Diagnosis present

## 2019-03-20 DIAGNOSIS — R222 Localized swelling, mass and lump, trunk: Secondary | ICD-10-CM | POA: Diagnosis not present

## 2019-03-20 DIAGNOSIS — G894 Chronic pain syndrome: Secondary | ICD-10-CM | POA: Diagnosis present

## 2019-03-20 DIAGNOSIS — Z820 Family history of epilepsy and other diseases of the nervous system: Secondary | ICD-10-CM

## 2019-03-20 DIAGNOSIS — Z91048 Other nonmedicinal substance allergy status: Secondary | ICD-10-CM | POA: Diagnosis not present

## 2019-03-20 DIAGNOSIS — Y830 Surgical operation with transplant of whole organ as the cause of abnormal reaction of the patient, or of later complication, without mention of misadventure at the time of the procedure: Secondary | ICD-10-CM | POA: Diagnosis present

## 2019-03-20 DIAGNOSIS — N184 Chronic kidney disease, stage 4 (severe): Secondary | ICD-10-CM | POA: Diagnosis not present

## 2019-03-20 DIAGNOSIS — I272 Pulmonary hypertension, unspecified: Secondary | ICD-10-CM | POA: Diagnosis present

## 2019-03-20 DIAGNOSIS — N179 Acute kidney failure, unspecified: Secondary | ICD-10-CM | POA: Diagnosis present

## 2019-03-20 DIAGNOSIS — G629 Polyneuropathy, unspecified: Secondary | ICD-10-CM | POA: Diagnosis present

## 2019-03-20 DIAGNOSIS — K3184 Gastroparesis: Secondary | ICD-10-CM | POA: Diagnosis not present

## 2019-03-20 DIAGNOSIS — I129 Hypertensive chronic kidney disease with stage 1 through stage 4 chronic kidney disease, or unspecified chronic kidney disease: Secondary | ICD-10-CM | POA: Diagnosis not present

## 2019-03-20 DIAGNOSIS — M8580 Other specified disorders of bone density and structure, unspecified site: Secondary | ICD-10-CM | POA: Diagnosis present

## 2019-03-20 DIAGNOSIS — T8619 Other complication of kidney transplant: Principal | ICD-10-CM | POA: Diagnosis present

## 2019-03-20 DIAGNOSIS — F17210 Nicotine dependence, cigarettes, uncomplicated: Secondary | ICD-10-CM | POA: Diagnosis present

## 2019-03-20 DIAGNOSIS — M5030 Other cervical disc degeneration, unspecified cervical region: Secondary | ICD-10-CM | POA: Diagnosis present

## 2019-03-20 DIAGNOSIS — Z881 Allergy status to other antibiotic agents status: Secondary | ICD-10-CM | POA: Diagnosis not present

## 2019-03-20 DIAGNOSIS — Z981 Arthrodesis status: Secondary | ICD-10-CM | POA: Diagnosis not present

## 2019-03-20 DIAGNOSIS — I4891 Unspecified atrial fibrillation: Secondary | ICD-10-CM | POA: Diagnosis present

## 2019-03-20 DIAGNOSIS — D631 Anemia in chronic kidney disease: Secondary | ICD-10-CM | POA: Diagnosis present

## 2019-03-20 DIAGNOSIS — M797 Fibromyalgia: Secondary | ICD-10-CM | POA: Diagnosis not present

## 2019-03-20 DIAGNOSIS — K219 Gastro-esophageal reflux disease without esophagitis: Secondary | ICD-10-CM | POA: Diagnosis not present

## 2019-03-20 DIAGNOSIS — R635 Abnormal weight gain: Secondary | ICD-10-CM | POA: Diagnosis not present

## 2019-03-20 DIAGNOSIS — K7460 Unspecified cirrhosis of liver: Secondary | ICD-10-CM | POA: Diagnosis present

## 2019-03-20 DIAGNOSIS — Z8249 Family history of ischemic heart disease and other diseases of the circulatory system: Secondary | ICD-10-CM

## 2019-03-20 DIAGNOSIS — Z79899 Other long term (current) drug therapy: Secondary | ICD-10-CM

## 2019-03-20 DIAGNOSIS — M1A9XX0 Chronic gout, unspecified, without tophus (tophi): Secondary | ICD-10-CM | POA: Diagnosis present

## 2019-03-20 DIAGNOSIS — Z83511 Family history of glaucoma: Secondary | ICD-10-CM | POA: Diagnosis not present

## 2019-03-20 DIAGNOSIS — I270 Primary pulmonary hypertension: Secondary | ICD-10-CM | POA: Diagnosis not present

## 2019-03-20 DIAGNOSIS — I130 Hypertensive heart and chronic kidney disease with heart failure and stage 1 through stage 4 chronic kidney disease, or unspecified chronic kidney disease: Secondary | ICD-10-CM | POA: Diagnosis not present

## 2019-03-20 DIAGNOSIS — Z8673 Personal history of transient ischemic attack (TIA), and cerebral infarction without residual deficits: Secondary | ICD-10-CM

## 2019-03-20 DIAGNOSIS — R188 Other ascites: Secondary | ICD-10-CM | POA: Diagnosis not present

## 2019-03-20 DIAGNOSIS — Z9225 Personal history of immunosupression therapy: Secondary | ICD-10-CM | POA: Diagnosis not present

## 2019-03-20 DIAGNOSIS — N289 Disorder of kidney and ureter, unspecified: Secondary | ICD-10-CM | POA: Diagnosis not present

## 2019-03-20 DIAGNOSIS — I5033 Acute on chronic diastolic (congestive) heart failure: Secondary | ICD-10-CM | POA: Diagnosis present

## 2019-03-20 DIAGNOSIS — Z7951 Long term (current) use of inhaled steroids: Secondary | ICD-10-CM

## 2019-03-20 DIAGNOSIS — Z7982 Long term (current) use of aspirin: Secondary | ICD-10-CM

## 2019-03-20 LAB — URINALYSIS, ROUTINE W REFLEX MICROSCOPIC
Bilirubin Urine: NEGATIVE
Glucose, UA: NEGATIVE mg/dL
Hgb urine dipstick: NEGATIVE
Ketones, ur: NEGATIVE mg/dL
Nitrite: NEGATIVE
Protein, ur: NEGATIVE mg/dL
Specific Gravity, Urine: 1.008 (ref 1.005–1.030)
pH: 7 (ref 5.0–8.0)

## 2019-03-20 LAB — RENAL FUNCTION PANEL
Albumin: 3.4 g/dL — ABNORMAL LOW (ref 3.5–5.0)
Anion gap: 11 (ref 5–15)
BUN: 42 mg/dL — ABNORMAL HIGH (ref 6–20)
CO2: 25 mmol/L (ref 22–32)
Calcium: 8.8 mg/dL — ABNORMAL LOW (ref 8.9–10.3)
Chloride: 102 mmol/L (ref 98–111)
Creatinine, Ser: 2.9 mg/dL — ABNORMAL HIGH (ref 0.44–1.00)
GFR calc Af Amer: 21 mL/min — ABNORMAL LOW
GFR calc non Af Amer: 18 mL/min — ABNORMAL LOW
Glucose, Bld: 93 mg/dL (ref 70–99)
Phosphorus: 4.3 mg/dL (ref 2.5–4.6)
Potassium: 3.9 mmol/L (ref 3.5–5.1)
Sodium: 138 mmol/L (ref 135–145)

## 2019-03-20 LAB — URIC ACID: Uric Acid, Serum: 13.1 mg/dL — ABNORMAL HIGH (ref 2.5–7.1)

## 2019-03-20 MED ORDER — ACETAMINOPHEN 650 MG RE SUPP: 650.0000 mg | Freq: Four times a day (QID) | RECTAL | Status: DC | PRN

## 2019-03-20 MED ORDER — VITAMIN D (ERGOCALCIFEROL) 1.25 MG (50000 UNIT) PO CAPS
50000.0000 [IU] | ORAL_CAPSULE | ORAL | Status: DC
  Filled 2019-03-20: qty 1

## 2019-03-20 MED ORDER — LATANOPROST 0.005 % OP SOLN
1.0000 [drp] | Freq: Every day | OPHTHALMIC | Status: DC
  Administered 2019-03-20 – 2019-03-23 (×4): 1 [drp] via OPHTHALMIC
  Filled 2019-03-20: qty 2.5

## 2019-03-20 MED ORDER — PANTOPRAZOLE SODIUM 40 MG PO TBEC
40.0000 mg | DELAYED_RELEASE_TABLET | Freq: Every day | ORAL | Status: DC
  Administered 2019-03-20 – 2019-03-21 (×2): 40 mg via ORAL
  Filled 2019-03-20 (×2): qty 1

## 2019-03-20 MED ORDER — METOCLOPRAMIDE HCL 10 MG PO TABS
10.0000 mg | ORAL_TABLET | Freq: Three times a day (TID) | ORAL | Status: DC | PRN
  Administered 2019-03-20: 10 mg via ORAL
  Filled 2019-03-20 (×2): qty 1

## 2019-03-20 MED ORDER — ALBUTEROL SULFATE (2.5 MG/3ML) 0.083% IN NEBU: 3.0000 mL | INHALATION_SOLUTION | Freq: Four times a day (QID) | RESPIRATORY_TRACT | Status: DC | PRN

## 2019-03-20 MED ORDER — ASPIRIN EC 81 MG PO TBEC
81.0000 mg | DELAYED_RELEASE_TABLET | Freq: Every day | ORAL | Status: DC
  Administered 2019-03-21 – 2019-03-24 (×4): 81 mg via ORAL
  Filled 2019-03-20 (×4): qty 1

## 2019-03-20 MED ORDER — FLUTICASONE PROPIONATE 50 MCG/ACT NA SUSP
1.0000 | Freq: Every day | NASAL | Status: DC | PRN
  Filled 2019-03-20: qty 16

## 2019-03-20 MED ORDER — ACETAMINOPHEN 325 MG PO TABS
650.0000 mg | ORAL_TABLET | Freq: Four times a day (QID) | ORAL | Status: DC | PRN
  Administered 2019-03-23: 650 mg via ORAL
  Filled 2019-03-20 (×2): qty 2

## 2019-03-20 MED ORDER — OXYCODONE HCL 5 MG PO TABS
5.0000 mg | ORAL_TABLET | Freq: Four times a day (QID) | ORAL | Status: DC | PRN
  Administered 2019-03-20: 5 mg via ORAL
  Filled 2019-03-20: qty 1

## 2019-03-20 MED ORDER — GABAPENTIN 100 MG PO CAPS
100.0000 mg | ORAL_CAPSULE | Freq: Two times a day (BID) | ORAL | Status: DC
  Administered 2019-03-20 – 2019-03-24 (×8): 100 mg via ORAL
  Filled 2019-03-20 (×8): qty 1

## 2019-03-20 MED ORDER — DIPHENHYDRAMINE HCL 25 MG PO CAPS
25.0000 mg | ORAL_CAPSULE | Freq: Three times a day (TID) | ORAL | Status: DC | PRN
  Administered 2019-03-21: 25 mg via ORAL
  Filled 2019-03-20: qty 1

## 2019-03-20 MED ORDER — OXYCODONE-ACETAMINOPHEN 5-325 MG PO TABS
1.0000 | ORAL_TABLET | Freq: Four times a day (QID) | ORAL | Status: DC | PRN
  Administered 2019-03-20: 1 via ORAL
  Filled 2019-03-20: qty 1

## 2019-03-20 MED ORDER — AZATHIOPRINE 50 MG PO TABS
100.0000 mg | ORAL_TABLET | Freq: Every day | ORAL | Status: DC
  Administered 2019-03-21 – 2019-03-24 (×4): 100 mg via ORAL
  Filled 2019-03-20 (×5): qty 2

## 2019-03-20 MED ORDER — TORSEMIDE 20 MG PO TABS: 60.0000 mg | ORAL_TABLET | Freq: Every day | ORAL | Status: DC

## 2019-03-20 MED ORDER — TACROLIMUS 1 MG PO CAPS
2.0000 mg | ORAL_CAPSULE | Freq: Every day | ORAL | Status: DC
  Administered 2019-03-20 – 2019-03-23 (×4): 2 mg via ORAL
  Filled 2019-03-20 (×4): qty 2

## 2019-03-20 MED ORDER — METOLAZONE 5 MG PO TABS: 2.5000 mg | ORAL_TABLET | ORAL | Status: DC

## 2019-03-20 MED ORDER — FUROSEMIDE 10 MG/ML IJ SOLN
120.0000 mg | Freq: Four times a day (QID) | INTRAVENOUS | Status: DC
  Administered 2019-03-20 – 2019-03-21 (×2): 120 mg via INTRAVENOUS
  Filled 2019-03-20 (×2): qty 12
  Filled 2019-03-20: qty 2
  Filled 2019-03-20: qty 12
  Filled 2019-03-20: qty 10

## 2019-03-20 MED ORDER — ZOLPIDEM TARTRATE 5 MG PO TABS: 5.0000 mg | ORAL_TABLET | Freq: Every evening | ORAL | Status: DC | PRN

## 2019-03-20 MED ORDER — TACROLIMUS 1 MG PO CAPS
3.0000 mg | ORAL_CAPSULE | Freq: Every day | ORAL | Status: DC
  Administered 2019-03-21 – 2019-03-24 (×4): 3 mg via ORAL
  Filled 2019-03-20 (×4): qty 3

## 2019-03-20 MED ORDER — OXYCODONE-ACETAMINOPHEN 10-325 MG PO TABS: 1.0000 | ORAL_TABLET | Freq: Four times a day (QID) | ORAL | Status: DC | PRN

## 2019-03-20 NOTE — Progress Notes (Signed)
Patient currently NPO awaiting abdominal ultrasound. Patient states she does not want any of her daily medications today as she takes in the morning. Patient states she will take her bedtime medication today only. Patient awaiting IV access for IV lasix.

## 2019-03-20 NOTE — Progress Notes (Signed)
VAST consulted to obtain IV access. Spoke with pt's nurse who stated nephrologist requested USGIV d/t pt's poor vasculature. Pt admitted at [facility], MD request for IV diuresis. At this time, pt does not have any IVF or IV med orders. Pt's nurse to send pt to x-ray and VAST will come place IV access as soon as able.

## 2019-03-20 NOTE — Consult Note (Signed)
Reason for Consult: AKI/CKD stage 3 Referring Physician:  Roosevelt Locks, MD  Tina Watkins is an 54 y.o. female.  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 She had an elevated PRA, and required plasmapheresis prior to transplantation and has been on triple therapy. Her creatinine has ranged from 1.4 to 2 initially, but since, this trended up to 2 to 2.5 and she has had multiple episodes of acute kidney injury on chronic kidney disease related to volume depletion stemming from gastroparesis or more recently due to decompensated diastolic CHF/volume overload (grade III diastolic dysfunction by ECHO).  She was referred to the [facility] Failure [facility] and has been failing oral diuretics so was admitted for IV diuresis.  We were consulted to further evaluate and manage her AKI/CKD and transplant medications.  The trend in Scr is seen below.    She admits to high sodium diet but has lost 5 lbs since increasing torsemide and adding metolazone, but remains 20 lbs up.  She also admits to DOE, tightness of her abdomen and extremities.  She denies any N/V/D, dysuria, pyuria, hematuria, urgency, frequency, or retention.   Trend in Creatinine: Creatinine, Ser  Date/Time Value Ref Range Status  03/15/2019 04:24 PM 3.01 (H) 0.44 - 1.00 mg/dL Final  03/08/2019 10:27 AM 3.00 (H) 0.44 - 1.00 mg/dL Final  02/10/2019 08:39 AM 2.66 (H) 0.44 - 1.00 mg/dL Final  12/30/2018 09:35 AM 2.21 (H) 0.44 - 1.00 mg/dL Final  11/04/2018 10:03 AM 2.01 (H) 0.44 - 1.00 mg/dL Final  09/09/2018 11:05 AM 1.95 (H) 0.44 - 1.00 mg/dL Final  08/25/2018 02:35 PM 2.20 (H) 0.44 - 1.00 mg/dL Final  07/15/2018 11:26 AM 2.21 (H) 0.44 - 1.00 mg/dL Final  05/06/2018 11:20 AM 2.17 (H) 0.44 - 1.00 mg/dL Final  03/11/2018 10:30 AM 2.41 (H) 0.44 -  1.00 mg/dL Final  01/12/2018 03:55 PM 1.97 (H) 0.44 - 1.00 mg/dL Final  01/03/2018 09:44 AM 2.45 (H) 0.44 - 1.00 mg/dL Final  11/20/2017 04:07 AM 2.03 (H) 0.44 - 1.00 mg/dL Final  11/19/2017 08:59 PM 2.03 (H) 0.44 - 1.00 mg/dL Final  10/08/2017 10:40 AM 1.94 (H) 0.44 - 1.00 mg/dL Final  08/17/2017 06:50 AM 3.41 (H) 0.44 - 1.00 mg/dL Final  08/16/2017 03:44 AM 3.52 (H) 0.44 - 1.00 mg/dL Final  08/15/2017 04:24 AM 3.74 (H) 0.44 - 1.00 mg/dL Final  08/14/2017 05:45 AM 4.52 (H) 0.44 - 1.00 mg/dL Final  08/13/2017 04:04 AM 4.36 (H) 0.44 - 1.00 mg/dL Final  08/12/2017 05:27 AM 3.89 (H) 0.44 - 1.00 mg/dL Final  08/11/2017 05:31 AM 2.65 (H) 0.44 - 1.00 mg/dL Final  08/10/2017 04:17 AM 2.57 (H) 0.44 - 1.00 mg/dL Final  08/09/2017 04:12 PM 2.95 (H) 0.44 - 1.00 mg/dL Final  08/09/2017 02:52 PM 2.85 (H) 0.44 - 1.00 mg/dL Final  08/06/2017 09:25 AM 2.22 (H) 0.44 - 1.00 mg/dL Final  07/18/2017 04:34 PM 2.09 (H) 0.44 - 1.00 mg/dL Final  06/05/2017 04:08 AM 2.18 (H) 0.44 - 1.00 mg/dL Final  05/14/2017 10:30 AM 2.06 (H) 0.44 - 1.00 mg/dL Final  04/02/2017 10:00 AM 1.96 (H) 0.44 - 1.00 mg/dL Final  03/05/2017 11:00 AM 2.07 (H) 0.44 - 1.00 mg/dL Final  02/05/2017 09:54 AM 2.15 (H) 0.44 - 1.00 mg/dL Final  01/08/2017 10:45 AM 2.47 (H) 0.44 - 1.00 mg/dL Final  12/04/2016 11:07 AM 2.21 (H) 0.44 - 1.00 mg/dL Final  11/06/2016 10:15 AM 2.46 (H) 0.44 - 1.00 mg/dL Final  10/09/2016 09:29 AM 2.59 (H) 0.44 - 1.00 mg/dL Final  09/04/2016 10:34 AM 2.76 (H) 0.44 - 1.00 mg/dL Final  07/03/2016 09:47 AM 2.20 (H) 0.44 - 1.00 mg/dL Final  06/05/2016 10:35 AM 2.53 (H) 0.44 - 1.00 mg/dL Final  05/08/2016 10:00 AM 2.34 (H) 0.44 - 1.00 mg/dL Final  03/11/2016 09:36 AM 1.99 (H) 0.44 - 1.00 mg/dL Final  02/07/2016 09:23 AM 2.16 (H) 0.44 - 1.00 mg/dL Final  12/16/2015 10:50 AM 2.06 (H) 0.44 - 1.00 mg/dL Final  11/08/2015 09:30 AM 2.06 (H) 0.44 - 1.00 mg/dL Final  10/23/2015 08:01 PM 2.30 (H) 0.44 - 1.00 mg/dL Final   10/03/2015 02:37 AM 3.05 (H) 0.44 - 1.00 mg/dL Final  10/02/2015 03:11 AM 3.16 (H) 0.44 - 1.00 mg/dL Final  10/01/2015 03:48 AM 2.74 (H) 0.44 - 1.00 mg/dL Final  09/30/2015 05:18 AM 3.24 (H) 0.44 - 1.00 mg/dL Final  09/29/2015 04:48 PM 3.41 (H) 0.44 - 1.00 mg/dL Final  09/25/2015 07:43 PM 2.56 (H) 0.44 - 1.00 mg/dL Final  09/02/2015 09:59 AM 2.66 (H) 0.44 - 1.00 mg/dL Final    PMH:   Past Medical History:  Diagnosis Date  . Bacteremia due to Gram-negative bacteria 05/23/2011  . Blind    right eye  . Blind right eye   . CHF (congestive heart failure) ([place])   . Chronic lower back pain   . Complication of anesthesia    "woke up during OR; I have an extremely high tolerance" (12/11/2011) 1 procedure was graft; the other procedures were procedures that are typically done with sedation.  . DDD (degenerative disc disease), cervical   . Depression   . Dysrhythmia    "tachycardia" (12/11/2011) new onset afib 10/15/14 EKG  . E coli bacteremia 06/18/2011  . Elevated LDL cholesterol level 12/2018  . ESRD (end stage renal disease) ([place]) 06/12/2011  . Fibromyalgia   . Gastroparesis   . Gastroparesis   . GERD (gastroesophageal reflux disease)   . Glaucoma    right eye  . Gout   . H/O carpal tunnel syndrome   . Headache(784.0)    "not often anymore" (12/11/2011)  . Herpes genitalia 1994  . History of blood transfusion    "more than a few times" (12/11/2011)  . History of stomach ulcers   . Hypotension   . Iron deficiency anemia   . New onset a-fib ([place])    10/15/14 EKG  . Osteopenia   . Peripheral neuropathy 11/2018  . Seizures ([place]) 1994   "post transplant; only have had that one" (12/11/2011)  . Spinal stenosis in cervical region   . Stroke [facility])     left basal ganglia lacunar infarct; Right frontal lobe lacunar infarct.  . Stroke [facility]) ~ 1999; 2001   "briefly lost my vision; lost my right eye" (12/11/2011)  . Vitamin D deficiency 12/2018    PSH:   Past Surgical History:   Procedure Laterality Date  . ANTERIOR CERVICAL DECOMP/DISCECTOMY FUSION N/A 01/08/2015   Procedure: Anterior Cervical Three-Four/Four-Five Decompression/Diskectomy/Fusion;  Surgeon: Leeroy Cha, MD;  Location: [place] [facility];  Service: Neurosurgery;  Laterality: N/A;  C3-4 C4-5 Anterior cervical decompression/diskectomy/fusion  . APPENDECTOMY  ~ 2004  . CATARACT EXTRACTION     right eye  . COLONOSCOPY    . ENUCLEATION  2001   "right"  . ESOPHAGOGASTRODUODENOSCOPY (EGD) WITH PROPOFOL N/A 04/21/2012   Procedure:  ESOPHAGOGASTRODUODENOSCOPY (EGD) WITH PROPOFOL;  Surgeon: Milus Banister, MD;  Location: [facility] END[facility]COPY;  Service: Endoscopy;  Laterality: N/A;  . 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 [place]; 1999; 2005   "right"  . MULTIPLE TOOTH EXTRACTIONS    . TONSILLECTOMY    . [place]?; 1994; 2005    Allergies:  Allergies  Allergen Reactions  . Levofloxacin Itching and Rash  . Tape Other (See Comments)    "Certain surgical tapes peel off my skin"    Medications:   Prior to Admission medications   Medication Sig Start Date End Date Taking? Authorizing Provider  aspirin EC 81 MG tablet Take 81 mg by mouth daily.    Yes [provider]  azaTHIOprine (IMURAN) 50 MG tablet Take 100 mg by mouth daily.    Yes [provider]  colchicine 0.6 MG tablet Take 0.6-1.2 mg by mouth See admin instructions. Take 1.2 mg by mouth at onset, then take 0.6 mg three times a day as need for gout flare   Yes [provider]  Darbepoetin Alfa (ARANESP, ALBUMIN FREE, IJ) Inject as directed every 14 (fourteen) days.   Yes [provider]  diphenhydrAMINE (BENADRYL) 25 mg capsule Take 25 mg by mouth at bedtime as needed for itching.   Yes [provider]  fluticasone (FLONASE) 50 MCG/ACT nasal spray Place 1 spray into both nostrils daily as needed for allergies or  rhinitis.   Yes [provider]  furosemide (LASIX) 40 MG tablet Take 40 mg by mouth at bedtime.   Yes [provider]  gabapentin (NEURONTIN) 100 MG capsule Take 100 mg by mouth 2 (two) times daily.  09/27/18  Yes [provider]  latanoprost (XALATAN) 0.005 % ophthalmic solution Place 1 drop into the left eye at bedtime. 03/30/17  Yes [provider]  lidocaine (LIDODERM) 5 % Place 1 patch onto the skin daily as needed (for pain- remove old patch first).  10/28/18  Yes [provider]  metoCLOPramide (REGLAN) 10 MG tablet Take 1 tablet (10 mg total) by mouth every 8 (eight) hours as needed for nausea. 04/04/18  Yes Milus Banister, MD  metoCLOPramide (REGLAN) 5 MG tablet TAKE 1 TABLET BY MOUTH TWICE A DAY Patient taking differently: Take 5 mg by mouth 2 times daily at 12 noon and 4 pm.  01/19/19  Yes Milus Banister, MD  metolazone (ZAROXOLYN) 2.5 MG tablet Take 1 tablet (2.5 mg total) by mouth 2 (two) times a week. On Monday and Friday Patient taking differently: Take 2.5 mg by mouth See admin instructions. Take 2.5 mg by mouth two times a week- on Mondays and Fridays 03/09/19  Yes Bensimhon, Shaune Pascal, MD  nystatin (MYCOSTATIN) 100000 UNIT/ML suspension Use as directed 5 mLs in the mouth or throat daily as needed (FOR THRUSH).    Yes [provider]  omeprazole (PRILOSEC) 40 MG capsule Take 40 mg by mouth 2 (two) times daily.   Yes [provider]  ondansetron (ZOFRAN ODT) 4 MG disintegrating tablet Take 1 tablet (4 mg total) by mouth every 6 (six) hours as needed. Patient taking differently: Take 4 mg by mouth every 6 (six) hours as needed for nausea or vomiting (DISSOLVE IN THE MOUTH).  06/05/17  Yes Ward, Delice Bison, DO  oxyCODONE-acetaminophen (PERCOCET) 10-325 MG tablet Take 1 tablet by mouth See admin instructions.  Take 1 tablet by mouth every four to six hours as needed for pain   Yes [provider]  predniSONE (DELTASONE) 5 MG  tablet Take 5 mg by mouth daily. 06/22/11  Yes [name], MD  PROVENTIL HFA 108 [phone] Base) MCG/ACT inhaler Inhale 2 puffs into the lungs 4 (four) times daily as needed for shortness of breath. 08/04/17  Yes [provider]  tacrolimus (PROGRAF) 1 MG capsule Take 2-3 mg by mouth See admin instructions. Take 3 mg by mouth in the morning and 2 mg at bedtime 06/22/11  Yes [name], MD  Vitamin D, Ergocalciferol, (DRISDOL) 1.25 MG (50000 UT) CAPS capsule Take 1 capsule (50,000 Units total) by mouth every 7 (seven) days. Patient taking differently: Take 50,000 Units by mouth every Sunday.  12/20/18  Yes [name], FNP  zolpidem (AMBIEN) 10 MG tablet Take 10 mg by mouth at bedtime as needed for sleep.   Yes [provider]  potassium chloride SA (KLOR-CON) 20 MEQ tablet Take 1 tablet (20 mEq total) by mouth 2 (two) times a week. On Mondays and Fridays Patient not taking: Reported on 03/20/2019 03/09/19   Bensimhon, Shaune Pascal, MD  torsemide (DEMADEX) 20 MG tablet Take 3 tablets (60 mg total) by mouth daily. Patient not taking: Reported on 03/20/2019 03/08/19   Bensimhon, Shaune Pascal, MD    Inpatient medications: . aspirin EC  81 mg Oral Daily  . azaTHIOprine  100 mg Oral Daily  . gabapentin  100 mg Oral BID  . latanoprost  1 drop Left Eye QHS  . metolazone  2.5 mg Oral Once per day on Mon Thu  . pantoprazole  40 mg Oral Daily  . tacrolimus  2 mg Oral QHS  . tacrolimus  3 mg Oral Daily  . torsemide  60 mg Oral Daily  . Vitamin D (Ergocalciferol)  50,000 Units Oral Q7 days    Discontinued Meds:   Medications Discontinued During This Encounter  Medication Reason  . oxyCODONE-acetaminophen (PERCOCET) 10-325 MG per tablet 1 tablet P&T Policy: Therapeutic Substitute    Social History:  reports that she has been smoking cigarettes. She has a 5.60 pack-year smoking history. She has never used smokeless tobacco. She reports current alcohol use. She reports current drug  use. Drug: Marijuana.  Family History:   Family History  Problem Relation Age of Onset  . Glaucoma Mother   . Pancreatic cancer Father   . Multiple sclerosis Brother   . Hypertension Maternal Grandmother   . Breast cancer Neg Hx     Pertinent items are noted in HPI. Weight change:  No intake or output data in the 24 hours ending 03/20/19 1649 BP (!) 88/47 (BP Location: Left Arm)   Pulse 65   Temp 99 F (37.2 C) (Oral)   Wt 56.7 kg   SpO2 100%   BMI 26.35 kg/m  Vitals:   03/20/19 1300  BP: (!) 88/47  Pulse: 65  Temp: 99 F (37.2 C)  TempSrc: Oral  SpO2: 100%  Weight: 56.7 kg     General appearance: alert, cooperative and no distress Head: Normocephalic, without obvious abnormality, atraumatic Resp: rales bibasilar Cardio: regular rate and rhythm, S1, S2 normal, no murmur, click, rub or gallop GI: distended, +BS, soft, NT, + fluid wave Extremities: tense 2+ edema of bilateral lower extremities up to thighs  Labs: Basic Metabolic Panel: Recent Labs  Lab 03/15/19 1624  NA 139  K 4.3  CL 105  CO2  24  GLUCOSE 99  BUN 38*  CREATININE 3.01*  CALCIUM 9.5   Liver Function Tests: No results for input(s): AST, ALT, ALKPHOS, BILITOT, PROT, ALBUMIN in the last 168 hours. No results for input(s): LIPASE, AMYLASE in the last 168 hours. No results for input(s): AMMONIA in the last 168 hours. CBC: No results for input(s): WBC, NEUTROABS, HGB, HCT, MCV, PLT in the last 168 hours. PT/INR: @LABRCNTIP (inr:5) Cardiac Enzymes: )No results for input(s): CKTOTAL, CKMB, CKMBINDEX, TROPONINI in the last 168 hours. CBG: No results for input(s): GLUCAP in the last 168 hours.  Iron Studies: No results for input(s): IRON, TIBC, TRANSFERRIN, FERRITIN in the last 168 hours.  Xrays/Other Studies: No results found.   Assessment/Plan: 1.  Acute on chronic diastolic CHF- ECHO with grade 3 diastolic dysfunction.  Agree with admission for IV diuresis, especially in light of  worsening renal function with po diuretics.  Have discussed the need for right heart cath once volume status has improved with Dr. Haroldine Laws. 2. AKI/CKD stage 3- baseline Cr 2-2.5 and now rising with increased diuretics, likely due to cardiorenal syndrome.  We did discuss the possible need of HD if she does not respond to IV lasix.  Labs pending iv access. 3. HTN- low, not on meds and may benefit from midodrine but pending further cardiac workup. 4. Tobacco abuse- ongoing and stressed smoking cessation 5. Chronic immunosuppressive therapy- continue with prograf 3 mg qam and 2 mg qpm, prednisone 5 mg daily, and imuran 50 mg bid.   Governor [place]ooks Drelyn Pistilli 03/20/2019, 4:49 PM

## 2019-03-20 NOTE — H&P (Addendum)
History and Physical    [name] ZOX:[id number] DOB: [birth year] DOA: 03/20/2019  PCP: [name], FNP   Patient coming from: Home  I have personally briefly reviewed patient's old medical records in [place]  Chief Complaint: Body wall and thighs swelling  HPI: Tina Watkins is a 54 y.o. female with medical history significant of end-stage renal disease secondary status post her third kidney transplant,(from her uncle), performed in [place] on August 13, 2003. Patient was sent from cardiologist office for direct admission because of worsening kidney function, failure to respond to high-dose of diuresis, and weight gaining/swelling of her abdominal wall and bilateral thighs. She has been following with cardiologist for diastolic CHF/volume overload (grade III diastolic dysfunction by ECHO) and cardiologist switched her from Lasix to torsemide last month for worsening of her swelling and weight gain.  She said she has not responded to torsemide at all.   She was at her cardiologist last week and blood work showed gradually trending up of her kidney functions, and cardiologist recommended she be admitted for the management of her worsening kidney function and heart failure.   She denies any chest pain, no short of breath no cough, no fever chills.  She has been feeling soreness of her abdomen wall and bilateral thighs, which made her walk walking wobbly. ED Course: Patient came directly to [facility] floor.  Floor nurse found very difficult to establish peripheral IV line. BP borderline low.  Review of Systems: As per HPI otherwise 10 point review of systems negative.    Past Medical History:  Diagnosis Date   Bacteremia due to Gram-negative bacteria 05/23/2011   Blind    right eye   Blind right eye    CHF (congestive heart failure) (HCC)    Chronic lower back pain    Complication of anesthesia    "woke up during OR; I have an extremely high  tolerance" (12/11/2011) 1 procedure was graft; the other procedures were procedures that are typically done with sedation.   DDD (degenerative disc disease), cervical    Depression    Dysrhythmia    "tachycardia" (12/11/2011) new onset afib 10/15/14 EKG   E coli bacteremia 06/18/2011   Elevated LDL cholesterol level 12/2018   ESRD (end stage renal disease) ([place]) 06/12/2011   Fibromyalgia    Gastroparesis    Gastroparesis    GERD (gastroesophageal reflux disease)    Glaucoma    right eye   Gout    H/O carpal tunnel syndrome    Headache(784.0)    "not often anymore" (12/11/2011)   Herpes genitalia 1994   History of blood transfusion    "more than a few times" (12/11/2011)   History of stomach ulcers    Hypotension    Iron deficiency anemia    New onset a-fib ([place])    10/15/14 EKG   Osteopenia    Peripheral neuropathy 11/2018   Seizures ([place]) 1994   "post transplant; only have had that one" (12/11/2011)   Spinal stenosis in cervical region    Stroke [facility])     left basal ganglia lacunar infarct; Right frontal lobe lacunar infarct.   Stroke [facility]) ~ 1999; 2001   "briefly lost my vision; lost my right eye" (12/11/2011)   Vitamin D deficiency 12/2018    Past Surgical History:  Procedure Laterality Date   ANTERIOR CERVICAL DECOMP/DISCECTOMY FUSION N/A 01/08/2015   Procedure: Anterior Cervical Three-Four/Four-Five Decompression/Diskectomy/Fusion;  Surgeon: Leeroy Cha, MD;  Location: [facility] NEURO ORS;  Service: Neurosurgery;  Laterality: N/A;  C3-4 C4-5 Anterior cervical decompression/diskectomy/fusion   APPENDECTOMY  ~ 2004   CATARACT EXTRACTION     right eye   COLONOSCOPY     ENUCLEATION  2001   "right"   ESOPHAGOGASTRODUODENOSCOPY (EGD) WITH PROPOFOL N/A 04/21/2012   Procedure: ESOPHAGOGASTRODUODENOSCOPY (EGD) WITH PROPOFOL;  Surgeon: Milus Banister, MD;  Location: [facility] ENDOSCOPY;  Service: Endoscopy;  Laterality: N/A;   INSERTION OF DIALYSIS CATHETER   1988   "AV graft LUA & LFA; LUA worked for 1 day; LFA never worked"   [place]; 1999; 2005   "right"   [place]?; 1994; 2005     reports that she has been smoking cigarettes. She has a 5.60 pack-year smoking history. She has never used smokeless tobacco. She reports current alcohol use. She reports current drug use. Drug: Marijuana.  Allergies  Allergen Reactions   Levofloxacin Itching and Rash   Tape Other (See Comments)    "Certain surgical tapes peel off my skin"    Family History  Problem Relation Age of Onset   Glaucoma Mother    Pancreatic cancer Father    Multiple sclerosis Brother    Hypertension Maternal Grandmother    Breast cancer Neg Hx      Prior to Admission medications   Medication Sig Start Date End Date Taking? Authorizing Provider  aspirin EC 81 MG tablet Take 81 mg by mouth daily.    Yes [provider]  azaTHIOprine (IMURAN) 50 MG tablet Take 100 mg by mouth daily.    Yes [provider]  colchicine 0.6 MG tablet Take 0.6-1.2 mg by mouth See admin instructions. Take 1.2 mg by mouth at onset, then take 0.6 mg three times a day as need for gout flare   Yes [provider]  Darbepoetin Alfa (ARANESP, ALBUMIN FREE, IJ) Inject as directed every 14 (fourteen) days.   Yes [provider]  diphenhydrAMINE (BENADRYL) 25 mg capsule Take 25 mg by mouth at bedtime as needed for itching.   Yes [provider]  fluticasone (FLONASE) 50 MCG/ACT nasal spray Place 1 spray into both nostrils daily as needed for allergies or rhinitis.   Yes [provider]  furosemide (LASIX) 40 MG tablet Take 40 mg by mouth at bedtime.   Yes [provider]  gabapentin (NEURONTIN) 100 MG capsule Take 100 mg by mouth 2 (two) times daily.  09/27/18  Yes [provider]  latanoprost (XALATAN) 0.005 % ophthalmic solution Place 1 drop into  the left eye at bedtime. 03/30/17  Yes [provider]  lidocaine (LIDODERM) 5 % Place 1 patch onto the skin daily as needed (for pain- remove old patch first).  10/28/18  Yes [provider]  metoCLOPramide (REGLAN) 10 MG tablet Take 1 tablet (10 mg total) by mouth every 8 (eight) hours as needed for nausea. 04/04/18  Yes Milus Banister, MD  metoCLOPramide (REGLAN) 5 MG tablet TAKE 1 TABLET BY MOUTH TWICE A DAY Patient taking differently: Take 5 mg by mouth 2 times daily at 12 noon and 4 pm.  01/19/19  Yes Milus Banister, MD  metolazone (ZAROXOLYN) 2.5 MG tablet Take 1 tablet (2.5 mg total) by mouth 2 (two) times a week. On Monday and Friday Patient taking differently: Take 2.5 mg by mouth See admin instructions. Take 2.5 mg by mouth two times a week- on Mondays and  Fridays 03/09/19  Yes Bensimhon, Shaune Pascal, MD  nystatin (MYCOSTATIN) 100000 UNIT/ML suspension Use as directed 5 mLs in the mouth or throat daily as needed (FOR THRUSH).    Yes [provider]  omeprazole (PRILOSEC) 40 MG capsule Take 40 mg by mouth 2 (two) times daily.   Yes [provider]  ondansetron (ZOFRAN ODT) 4 MG disintegrating tablet Take 1 tablet (4 mg total) by mouth every 6 (six) hours as needed. Patient taking differently: Take 4 mg by mouth every 6 (six) hours as needed for nausea or vomiting (DISSOLVE IN THE MOUTH).  06/05/17  Yes Ward, Delice Bison, DO  oxyCODONE-acetaminophen (PERCOCET) 10-325 MG tablet Take 1 tablet by mouth See admin instructions. Take 1 tablet by mouth every four to six hours as needed for pain   Yes [provider]  predniSONE (DELTASONE) 5 MG tablet Take 5 mg by mouth daily. 06/22/11  Yes [name], MD  PROVENTIL HFA 108 [phone] Base) MCG/ACT inhaler Inhale 2 puffs into the lungs 4 (four) times daily as needed for shortness of breath. 08/04/17  Yes [provider]  tacrolimus (PROGRAF) 1 MG capsule Take 2-3 mg by mouth See admin instructions. Take 3  mg by mouth in the morning and 2 mg at bedtime 06/22/11  Yes [name], MD  Vitamin D, Ergocalciferol, (DRISDOL) 1.25 MG (50000 UT) CAPS capsule Take 1 capsule (50,000 Units total) by mouth every 7 (seven) days. Patient taking differently: Take 50,000 Units by mouth every Sunday.  12/20/18  Yes [name], FNP  zolpidem (AMBIEN) 10 MG tablet Take 10 mg by mouth at bedtime as needed for sleep.   Yes [provider]  potassium chloride SA (KLOR-CON) 20 MEQ tablet Take 1 tablet (20 mEq total) by mouth 2 (two) times a week. On Mondays and Fridays Patient not taking: Reported on 03/20/2019 03/09/19   Bensimhon, Shaune Pascal, MD  torsemide (DEMADEX) 20 MG tablet Take 3 tablets (60 mg total) by mouth daily. Patient not taking: Reported on 03/20/2019 03/08/19   Bensimhon, Shaune Pascal, MD    Physical Exam: Vitals:   03/20/19 1300  BP: (!) 88/47  Pulse: 65  Temp: 99 F (37.2 C)  TempSrc: Oral  SpO2: 100%  Weight: 56.7 kg    Constitutional: NAD, calm, comfortable Vitals:   03/20/19 1300  BP: (!) 88/47  Pulse: 65  Temp: 99 F (37.2 C)  TempSrc: Oral  SpO2: 100%  Weight: 56.7 kg   Eyes: PERRL, lids and conjunctivae normal ENMT: Mucous membranes are moist. Posterior pharynx clear of any exudate or lesions.Normal dentition.  Neck: normal, supple, no masses, no thyromegaly Respiratory: clear to auscultation bilaterally, no wheezing, no crackles. Normal respiratory effort. No accessory muscle use.  Cardiovascular: Regular rate and rhythm, no murmurs / rubs / gallops. Abd wall and B/L thigh 2 + pitting edema with tenderness and soreness. 2+ pedal pulses. No carotid bruits.  Abdomen: no tenderness, no masses palpated. No hepatosplenomegaly. Bowel sounds positive.  Musculoskeletal: no clubbing / cyanosis. No joint deformity upper and lower extremities. Good ROM, no contractures. Normal muscle tone.  Skin: no rashes, lesions, ulcers. No induration Neurologic: CN 2-12 grossly intact.  Sensation intact, DTR normal. Strength 5/5 in all 4.  Psychiatric: Normal judgment and insight. Alert and oriented x 3. Normal mood.     Labs on Admission: I have personally reviewed following labs and imaging studies  CBC: No results for input(s): WBC, NEUTROABS, HGB, HCT, MCV, PLT in the  last 168 hours. Basic Metabolic Panel: Recent Labs  Lab 03/15/19 1624  NA 139  K 4.3  CL 105  CO2 24  GLUCOSE 99  BUN 38*  CREATININE 3.01*  CALCIUM 9.5   GFR: Estimated Creatinine Clearance: 16.4 mL/min (A) (by C-G formula based on SCr of 3.01 mg/dL (H)). Liver Function Tests: No results for input(s): AST, ALT, ALKPHOS, BILITOT, PROT, ALBUMIN in the last 168 hours. No results for input(s): LIPASE, AMYLASE in the last 168 hours. No results for input(s): AMMONIA in the last 168 hours. Coagulation Profile: No results for input(s): INR, PROTIME in the last 168 hours. Cardiac Enzymes: No results for input(s): CKTOTAL, CKMB, CKMBINDEX, TROPONINI in the last 168 hours. BNP (last 3 results) No results for input(s): PROBNP in the last 8760 hours. HbA1C: No results for input(s): HGBA1C in the last 72 hours. CBG: No results for input(s): GLUCAP in the last 168 hours. Lipid Profile: No results for input(s): CHOL, HDL, LDLCALC, TRIG, CHOLHDL, LDLDIRECT in the last 72 hours. Thyroid Function Tests: No results for input(s): TSH, T4TOTAL, FREET4, T3FREE, THYROIDAB in the last 72 hours. Anemia Panel: No results for input(s): VITAMINB12, FOLATE, FERRITIN, TIBC, IRON, RETICCTPCT in the last 72 hours. Urine analysis:    Component Value Date/Time   COLORURINE YELLOW 08/25/2018 2050   APPEARANCEUR CLEAR 08/25/2018 2050   LABSPEC 1.008 08/25/2018 2050   PHURINE 7.0 08/25/2018 2050   GLUCOSEU NEGATIVE 08/25/2018 2050   HGBUR SMALL (A) 08/25/2018 2050   BILIRUBINUR Negative 01/04/2019 [place] 08/25/2018 2050   PROTEINUR Negative 01/04/2019 1030   PROTEINUR NEGATIVE 08/25/2018 2050     UROBILINOGEN 0.2 01/04/2019 1030   UROBILINOGEN 0.2 10/15/2014 0946   NITRITE Negative 01/04/2019 1030   NITRITE NEGATIVE 08/25/2018 2050   LEUKOCYTESUR Negative 01/04/2019 1030   LEUKOCYTESUR MODERATE (A) 08/25/2018 2050    Radiological Exams on Admission: No results found.  EKG: Ordered  Assessment/Plan Active Problems:   AKI (acute kidney injury) ([place])  AKI on CKD stage IV Her blood pressure too low for aggressive diuresis today, and her albumin level has been normal, clinically she does look dry despite having signs of peripheral edema.  Discussed with on-call nephrologist Dr. Joesph July, who will see her today. Patient said her last dialysis was more than 10 years ago, with temporary catheter, she never had AV fistula established. Her electrolytes largely normal, no significant uremia, no acidemia, she prefers on her own home air, no indication for emergency dialysis this point.  Ascites Abd U/S  Moderated Pulm HTN This was consistently shown on Echo in 2017 as well as recent months I will order a V/Q to rule out chronic PE for once.  Access issue As per nephrologist who knows the patient well, patient has access issue before, this time patient however very against tunneled PICC.  Again nephrology will talk to patient regarding options of access IV team consulted.  Chronic diastolic CHF On dry side despite some signs of peripheral edema Hold diuresis for today  History of kidney transplant Continue immune suppressant  Gout Hold colchicine given worsening of kidney function and check uric acid level   DVT prophylaxis: Ambulation and SCD Code Status: Full code Family Communication: None at bedside Disposition Plan: Depends on any function and fluid status likely can go home in 2 to 3 days Consults called: Dr. Loura Back, cardiology also on board Admission status: Telemetry admission   Lequita Halt MD [facility] Pager [phone]    03/20/2019, 5:15  PM

## 2019-03-20 NOTE — Progress Notes (Signed)
Patient arrived to unit from home. CHG bath given, white board explained, vss, call bell instructions given and within reach. Pt made aware of valuable policy. Pt has clothing, glasses, cell phone, and purse. Pharmacy tech called for home medication reconciliation. Patient states she has taken none of her home medications and she takes pain medication every day. I have explained that admitting MD will come and assess her and place all orders. Pt resting with call bell within reach.  Will continue to monitor.

## 2019-03-20 NOTE — Progress Notes (Signed)
Patient current BP 95/56, Lasix IV started. Will continue to monitor BP

## 2019-03-21 ENCOUNTER — Inpatient Hospital Stay (HOSPITAL_COMMUNITY): Payer: Medicaid Other

## 2019-03-21 DIAGNOSIS — N183 Chronic kidney disease, stage 3 unspecified: Secondary | ICD-10-CM

## 2019-03-21 DIAGNOSIS — N179 Acute kidney failure, unspecified: Secondary | ICD-10-CM

## 2019-03-21 DIAGNOSIS — I5033 Acute on chronic diastolic (congestive) heart failure: Secondary | ICD-10-CM

## 2019-03-21 LAB — BASIC METABOLIC PANEL
Anion gap: 13 (ref 5–15)
BUN: 38 mg/dL — ABNORMAL HIGH (ref 6–20)
CO2: 26 mmol/L (ref 22–32)
Calcium: 9.3 mg/dL (ref 8.9–10.3)
Chloride: 102 mmol/L (ref 98–111)
Creatinine, Ser: 2.81 mg/dL — ABNORMAL HIGH (ref 0.44–1.00)
GFR calc Af Amer: 22 mL/min — ABNORMAL LOW (ref >60)
GFR calc non Af Amer: 19 mL/min — ABNORMAL LOW (ref >60)
Glucose, Bld: 75 mg/dL (ref 70–99)
Potassium: 4.5 mmol/L (ref 3.5–5.1)
Sodium: 141 mmol/L (ref 135–145)

## 2019-03-21 LAB — CBC
HCT: 28.6 % — ABNORMAL LOW (ref 36.0–46.0)
Hemoglobin: 9.2 g/dL — ABNORMAL LOW (ref 12.0–15.0)
MCH: 32.3 pg (ref 26.0–34.0)
MCHC: 32.2 g/dL (ref 30.0–36.0)
MCV: 100.4 fL — ABNORMAL HIGH (ref 80.0–100.0)
Platelets: 148 10*3/uL — ABNORMAL LOW (ref 150–400)
RBC: 2.85 MIL/uL — ABNORMAL LOW (ref 3.87–5.11)
RDW: 21.3 % — ABNORMAL HIGH (ref 11.5–15.5)
WBC: 2.2 10*3/uL — ABNORMAL LOW (ref 4.0–10.5)
nRBC: 0 % (ref 0.0–0.2)

## 2019-03-21 LAB — HEPATIC FUNCTION PANEL
ALT: 17 U/L (ref 0–44)
AST: 29 U/L (ref 15–41)
Albumin: 3.6 g/dL (ref 3.5–5.0)
Alkaline Phosphatase: 106 U/L (ref 38–126)
Bilirubin, Direct: 0.2 mg/dL (ref 0.0–0.2)
Indirect Bilirubin: 0.9 mg/dL (ref 0.3–0.9)
Total Bilirubin: 1.1 mg/dL (ref 0.3–1.2)
Total Protein: 5.6 g/dL — ABNORMAL LOW (ref 6.5–8.1)

## 2019-03-21 LAB — HIV ANTIBODY (ROUTINE TESTING W REFLEX): HIV Screen 4th Generation wRfx: NONREACTIVE

## 2019-03-21 MED ORDER — OXYCODONE HCL 5 MG PO TABS
5.0000 mg | ORAL_TABLET | ORAL | Status: DC | PRN
  Administered 2019-03-21 – 2019-03-24 (×5): 5 mg via ORAL
  Filled 2019-03-21 (×5): qty 1

## 2019-03-21 MED ORDER — OXYCODONE-ACETAMINOPHEN 5-325 MG PO TABS
1.0000 | ORAL_TABLET | ORAL | Status: DC | PRN
  Administered 2019-03-21 – 2019-03-24 (×4): 1 via ORAL
  Filled 2019-03-21 (×4): qty 1

## 2019-03-21 MED ORDER — FUROSEMIDE 10 MG/ML IJ SOLN
120.0000 mg | Freq: Two times a day (BID) | INTRAVENOUS | Status: DC
  Administered 2019-03-21 – 2019-03-22 (×3): 120 mg via INTRAVENOUS
  Filled 2019-03-21 (×4): qty 12
  Filled 2019-03-21: qty 10

## 2019-03-21 MED ORDER — PREDNISONE 5 MG PO TABS
5.0000 mg | ORAL_TABLET | Freq: Every day | ORAL | Status: DC
  Administered 2019-03-21 – 2019-03-24 (×4): 5 mg via ORAL
  Filled 2019-03-21 (×4): qty 1

## 2019-03-21 MED ORDER — PANTOPRAZOLE SODIUM 40 MG PO TBEC
40.0000 mg | DELAYED_RELEASE_TABLET | Freq: Two times a day (BID) | ORAL | Status: DC
  Administered 2019-03-21 – 2019-03-24 (×6): 40 mg via ORAL
  Filled 2019-03-21 (×6): qty 1

## 2019-03-21 MED ORDER — TECHNETIUM TO 99M ALBUMIN AGGREGATED
1.5000 | Freq: Once | INTRAVENOUS | Status: AC | PRN
  Administered 2019-03-21: 1.5 via INTRAVENOUS

## 2019-03-21 MED ORDER — METOCLOPRAMIDE HCL 5 MG/ML IJ SOLN
10.0000 mg | Freq: Three times a day (TID) | INTRAMUSCULAR | Status: DC | PRN
  Administered 2019-03-21: 10 mg via INTRAVENOUS
  Filled 2019-03-21: qty 2

## 2019-03-21 MED ORDER — HEPARIN SODIUM (PORCINE) 5000 UNIT/ML IJ SOLN
5000.0000 [IU] | Freq: Three times a day (TID) | INTRAMUSCULAR | Status: DC
  Administered 2019-03-21 – 2019-03-22 (×5): 5000 [IU] via SUBCUTANEOUS
  Filled 2019-03-21 (×5): qty 1

## 2019-03-21 NOTE — Progress Notes (Signed)
PROGRESS NOTE    Tina Watkins  MPN:[id number] DOB: 12-07-1965 DOA: 03/20/2019 PCP: [name], FNP   Brief Narrative: 54 year old with past medical history significant for end-stage renal disease, status post third kidney transplant( last one from uncle) performed in [place] in July 2005.  Patient was referred from her cardiolog villagesist office for direct admission for further evaluation of worsening kidney function, heart failure not responding to oral diuretics.   Assessment & Plan:   Active Problems:   AKI (acute kidney injury) ([place])  1-AKI on chronic kidney disease a stage IV: Patient creatinine baseline 2--0.5. History of kidney transplant 2005. Appreciate nephrology evaluation. Patient was a started on IV Lasix, changed today to 120 twice daily. Creatinine  trending down to 2.8.  2-Acute on chronic diastolic heart failure exacerbation: Continue with IV Lasix IV twice daily On metolazone twice weekly. Nephrology recommending right-sided heart cath, will consult heart failure team.  3-hypertension, blood pressure currently low during this admission Further recommendation from heart failure team.  Chronic immunosuppressive therapy: Continue with Prograf, prednisone and Imuran.  Nodularity of liver by ultrasound: Check liver function test and hepatitis panel.  Chronic pain syndrome: Avoid IV pain medication to avoid future decrease in blood pressure. Continue with home dose oxycodone  Estimated body mass index is 26.35 kg/m as calculated from the following:   Height as of 03/08/19: 4' 9.75" (1.467 m).   Weight as of this encounter: 56.7 kg.   DVT prophylaxis: Heparin Code Status: Full code Family Communication: Care discussed with patient Disposition Plan:  Patient is from: Home Anticipated d/c date: 3 5 days Barriers to d/c or necessity for inpatient status: Patient in acute on chronic diastolic heart failure, acute on chronic renal failure  requiring diuretics, she might need right-sided heart cath.  Consultants:   Cardiology  Nephrology  Procedures:  Abdominal ultrasound:Unremarkable right lower quadrant transplant kidney in a patient with a history of bilateral nephrectomies. 2. Heterogeneous liver echotexture, with nodular liver capsule suggesting cirrhosis.  3. Otherwise unremarkable exam  Antimicrobials:    Subjective: Patient denies worsening shortness of breath, she still have significant lower extremity edema.  Objective: Vitals:   03/21/19 0300 03/21/19 0304 03/21/19 0400 03/21/19 0500  BP: (!) 98/56 (!) 93/54 (!) 97/59 104/63  Pulse:  (!) 55    Resp:  16    Temp:  98.5 F (36.9 C)    TempSrc:  Oral    SpO2:      Weight:        Intake/Output Summary (Last 24 hours) at 03/21/2019 0833 Last data filed at 03/21/2019 0800 Gross per 24 hour  Intake 62 ml  Output 2300 ml  Net -2238 ml   Filed Weights   03/20/19 1300  Weight: 56.7 kg    Examination:  General exam: Appears calm and comfortable  Respiratory system: Clear to auscultation. Respiratory effort normal. Cardiovascular system: S1 & S2 heard, RRR.  S3 present, +2 edema Gastrointestinal system: Abdomen is nondistended, soft and nontender. No organomegaly or masses felt. Normal bowel sounds heard. Central nervous system: Alert and oriented. Extremities: Symmetric 5 x 5 power. Skin: No rashes, lesions or ulcers    Data Reviewed: I have personally reviewed following labs and imaging studies  CBC: Recent Labs  Lab 03/21/19 0256  WBC 2.2*  HGB 9.2*  HCT 28.6*  MCV 100.4*  PLT 008*   Basic Metabolic Panel: Recent Labs  Lab 03/15/19 1624 03/20/19 1844 03/21/19 0256  NA 139 138 141  K  4.3 3.9 4.5  CL 105 102 102  CO2 24 25 26   GLUCOSE 99 93 75  BUN 38* 42* 38*  CREATININE 3.01* 2.90* 2.81*  CALCIUM 9.5 8.8* 9.3  PHOS  --  4.3  --    GFR: Estimated Creatinine Clearance: 17.5 mL/min (A) (by C-G formula based on SCr of  2.81 mg/dL (H)). Liver Function Tests: Recent Labs  Lab 03/20/19 1844  ALBUMIN 3.4*   No results for input(s): LIPASE, AMYLASE in the last 168 hours. No results for input(s): AMMONIA in the last 168 hours. Coagulation Profile: No results for input(s): INR, PROTIME in the last 168 hours. Cardiac Enzymes: No results for input(s): CKTOTAL, CKMB, CKMBINDEX, TROPONINI in the last 168 hours. BNP (last 3 results) No results for input(s): PROBNP in the last 8760 hours. HbA1C: No results for input(s): HGBA1C in the last 72 hours. CBG: No results for input(s): GLUCAP in the last 168 hours. Lipid Profile: No results for input(s): CHOL, HDL, LDLCALC, TRIG, CHOLHDL, LDLDIRECT in the last 72 hours. Thyroid Function Tests: No results for input(s): TSH, T4TOTAL, FREET4, T3FREE, THYROIDAB in the last 72 hours. Anemia Panel: No results for input(s): VITAMINB12, FOLATE, FERRITIN, TIBC, IRON, RETICCTPCT in the last 72 hours. Sepsis Labs: No results for input(s): PROCALCITON, LATICACIDVEN in the last 168 hours.  Recent Results (from the past 240 hour(s))  SARS CORONAVIRUS 2 (TAT 6-24 HRS) Nasopharyngeal Nasopharyngeal Swab     Status: None   Collection Time: 03/17/19  9:16 AM   Specimen: Nasopharyngeal Swab  Result Value Ref Range Status   SARS Coronavirus 2 NEGATIVE NEGATIVE Final    Comment: (NOTE) SARS-CoV-2 target nucleic acids are NOT DETECTED. The SARS-CoV-2 RNA is generally detectable in upper and lower respiratory specimens during the acute phase of infection. Negative results do not preclude SARS-CoV-2 infection, do not rule out co-infections with other pathogens, and should not be used as the sole basis for treatment or other patient management decisions. Negative results must be combined with clinical observations, patient history, and epidemiological information. The expected result is Negative. Fact Sheet for Patients: SugarRoll.be Fact Sheet for  Healthcare Providers: https://www.woods-mathews.com/ This test is not yet approved or cleared by the [place] FDA and  has been authorized for detection and/or diagnosis of SARS-CoV-2 by FDA under an Emergency Use Authorization (EUA). This EUA will remain  in effect (meaning this test can be used) for the duration of the COVID-19 declaration under Section 56 4(b)(1) of the Act, 21 U.S.C. section 360bbb-3(b)(1), unless the authorization is terminated or revoked sooner. Performed at [facility], [place] [address] [place], [place] [address]          Radiology Studies: DG Chest 2 View  Result Date: 03/20/2019 CLINICAL DATA:  Worsening kidney function, weight gain and swelling. EXAM: CHEST - 2 VIEW COMPARISON:  PA and lateral chest 08/25/2018. FINDINGS: Vascular stent in the superior vena cava is identified. Heart size is normal. No consolidative process, edema, pneumothorax or pleural effusion. No acute or focal bony abnormality. IMPRESSION: No acute disease. Electronically Signed   By: Inge Rise M.D.   On: 03/20/2019 17:49   US Abdomen Complete  Result Date: 03/20/2019 CLINICAL DATA:  Renal transplant, bilateral nephrectomy, ascites, acute renal insufficiency EXAM: ABDOMEN ULTRASOUND COMPLETE COMPARISON:  08/09/2017, 08/25/2018 FINDINGS: Gallbladder: No gallstones or wall thickening visualized. No sonographic Murphy sign noted by sonographer. Common bile duct: Diameter: 3 mm Liver: There is mild nodularity of the liver capsule, with coarsened increased echotexture  likely representing cirrhosis. No focal liver abnormalities. Portal vein is patent on color Doppler imaging with normal direction of blood flow towards the liver. IVC: No abnormality visualized. Pancreas: Visualized portion unremarkable. Spleen: Size and appearance within normal limits. Right Kidney: Surgically absent Left Kidney: Length: Surgically absent Transplant kidney: Located in the right lower  quadrant, 10.3 x 4.8 by 4.7 cm. 120 cc. Normal echotexture without focal mass. Abdominal aorta: No aneurysm visualized. Other findings: None. IMPRESSION: 1. Unremarkable right lower quadrant transplant kidney in a patient with a history of bilateral nephrectomies. 2. Heterogeneous liver echotexture, with nodular liver capsule suggesting cirrhosis. 3. Otherwise unremarkable exam. Electronically Signed   By: Randa Ngo M.D.   On: 03/20/2019 21:43        Scheduled Meds: . aspirin EC  81 mg Oral Daily  . azaTHIOprine  100 mg Oral Daily  . gabapentin  100 mg Oral BID  . latanoprost  1 drop Left Eye QHS  . metolazone  2.5 mg Oral Once per day on Mon Thu  . pantoprazole  40 mg Oral Daily  . predniSONE  5 mg Oral Q breakfast  . tacrolimus  2 mg Oral QHS  . tacrolimus  3 mg Oral Daily  . Vitamin D (Ergocalciferol)  50,000 Units Oral Q7 days   Continuous Infusions: . furosemide 120 mg (03/21/19 0621)     LOS: 1 day    Time spent: 35 minutes    Gal Smolinski A Jyra Lagares, MD [facility]   If 7PM-7AM, please contact night-coverage [email or web address]  03/21/2019, 8:33 AM

## 2019-03-21 NOTE — Progress Notes (Signed)
Patient asleep during bedside reporting but aroused for introduction. Nuclear medicine called at 0800 to come pick up patient for NM perfusion. Patient ordered late breakfast tray. Patient not stating any pain but requesting I look to see if notation was left by previous shift for IV pain medication. I told patient that I would page MD and make aware of request. Patient has been resting most of day and not called with any requests. Patient upset that pain medication was not ordered every four hours as she takes at home. MD updated order. Pt awake while discussing medication and fell asleep before I gave them. Explained that medication had been updated. Pt asleep again when I returned to room. Will wait for patient to be more awake before giving pain medication. Pt resting with call bell within reach.  Will continue to monitor.

## 2019-03-21 NOTE — Consult Note (Addendum)
Advanced Heart Failure Team Consult Note   Primary Physician: [name], FNP PCP-Cardiologist:  Evalina Field, MD  Reason for Consultation: Heart Failure   HPI:    Tina Watkins is seen today for evaluation of heart failure at the request of Dr Tyrell Antonio.   Tina Dontrice Tayloris a 54 y.o.femalewith a hx of gout, gastroparesis, CVA, ESRD status post renal transplant, heart failure with preserved ejection fraction, hypertension, and smoker.    In December gained a lot of fluid (~30 pounds) over the holidays and was referred to Dr. Audie Box. He recommended IV diuresis but she did not want to be admitted. Saw Dr. Marval Regal who switched to torsemide. Weight down 3 pounds 130 -> 127. (baseline 112).   Earlier this month she was seen by Dr Haroldine Laws and had marked volume overload. Torsemide was increased to 40 mg twice a day. She was also set up for hospital admit for IV diuresis.   She says had not improvement taking torsemide 40 mg twice a day but had a bad headache on the higher dose of torsemide.   Admitted with marked volume overload despite increasing home diuretic regimen. Pertinent admission labs included creatinine 2.9, uric acid 13, SARS 2 negative, and BNP 204. NM pulmonary perfusion negative for PE. Started on high dose IV lasix 120 mg twice a day with good response. Creatinine improving.   Nephrology following and dosing IV lasix.   Echo 01/25/19 EF 60-65%   Review of Systems: [y] = yes, [ ]  = no   . General: Weight gain [Y ]; Weight loss [ ] ; Anorexia [ ] ; Fatigue [Y ]; Fever [ ] ; Chills [ ] ; Weakness [ ]   . Cardiac: Chest pain/pressure [ ] ; Resting SOB [ ] ; Exertional SOB [ ] ; Orthopnea [ ] ; Pedal Edema [Y ]; Palpitations [ ] ; Syncope [ ] ; Presyncope [ ] ; Paroxysmal nocturnal dyspnea[ ]   . Pulmonary: Cough Blue.Reese ]; Wheezing[ ] ; Hemoptysis[ ] ; Sputum [ ] ; Snoring [ ]   . GI: Vomiting[ ] ; Dysphagia[ ] ; Melena[ ] ; Hematochezia [ ] ; Heartburn[ ] ;  Abdominal pain [ ] ; Constipation [ ] ; Diarrhea [ ] ; BRBPR [ ]   . GU: Hematuria[ ] ; Dysuria [ ] ; Nocturia[ ]   . Vascular: Pain in legs with walking [ ] ; Pain in feet with lying flat [ ] ; Non-healing sores [ ] ; Stroke [ ] ; TIA [ ] ; Slurred speech [ ] ;  . Neuro: Headaches[y ]; Vertigo[ ] ; Seizures[ ] ; Paresthesias[ ] ;Blurred vision [ ] ; Diplopia [ ] ; Vision changes [ ]   . Ortho/Skin: Arthritis Blue.Reese ]; Joint pain [Y ]; Muscle pain [ ] ; Joint swelling [ ] ; Back Pain [ Y]; Rash [ ]   . Psych: Depression[ ] ; Anxiety[ ]   . Heme: Bleeding problems [ ] ; Clotting disorders [ ] ; Anemia [ ]   . Endocrine: Diabetes [ ] ; Thyroid dysfunction[ ]   Home Medications Prior to Admission medications   Medication Sig Start Date End Date Taking? Authorizing Provider  aspirin EC 81 MG tablet Take 81 mg by mouth daily.    Yes [provider]  azaTHIOprine (IMURAN) 50 MG tablet Take 100 mg by mouth daily.    Yes [provider]  colchicine 0.6 MG tablet Take 0.6-1.2 mg by mouth See admin instructions. Take 1.2 mg by mouth at onset, then take 0.6 mg three times a day as need for gout flare   Yes [provider]  Darbepoetin Alfa (ARANESP, ALBUMIN FREE, IJ) Inject as directed every 14 (fourteen) days.   Yes  [provider]  diphenhydrAMINE (BENADRYL) 25 mg capsule Take 25 mg by mouth at bedtime as needed for itching.   Yes [provider]  fluticasone (FLONASE) 50 MCG/ACT nasal spray Place 1 spray into both nostrils daily as needed for allergies or rhinitis.   Yes [provider]  furosemide (LASIX) 40 MG tablet Take 40 mg by mouth at bedtime.   Yes [provider]  gabapentin (NEURONTIN) 100 MG capsule Take 100 mg by mouth 2 (two) times daily.  09/27/18  Yes [provider]  latanoprost (XALATAN) 0.005 % ophthalmic solution Place 1 drop into the left eye at bedtime. 03/30/17  Yes [provider]  lidocaine (LIDODERM) 5 % Place 1 patch onto the skin  daily as needed (for pain- remove old patch first).  10/28/18  Yes [provider]  metoCLOPramide (REGLAN) 10 MG tablet Take 1 tablet (10 mg total) by mouth every 8 (eight) hours as needed for nausea. 04/04/18  Yes Milus Banister, MD  metoCLOPramide (REGLAN) 5 MG tablet TAKE 1 TABLET BY MOUTH TWICE A DAY Patient taking differently: Take 5 mg by mouth 2 times daily at 12 noon and 4 pm.  01/19/19  Yes Milus Banister, MD  metolazone (ZAROXOLYN) 2.5 MG tablet Take 1 tablet (2.5 mg total) by mouth 2 (two) times a week. On Monday and Friday Patient taking differently: Take 2.5 mg by mouth See admin instructions. Take 2.5 mg by mouth two times a week- on Mondays and Fridays 03/09/19  Yes Jayren Cease, Shaune Pascal, MD  nystatin (MYCOSTATIN) 100000 UNIT/ML suspension Use as directed 5 mLs in the mouth or throat daily as needed (FOR THRUSH).    Yes [provider]  omeprazole (PRILOSEC) 40 MG capsule Take 40 mg by mouth 2 (two) times daily.   Yes [provider]  ondansetron (ZOFRAN ODT) 4 MG disintegrating tablet Take 1 tablet (4 mg total) by mouth every 6 (six) hours as needed. Patient taking differently: Take 4 mg by mouth every 6 (six) hours as needed for nausea or vomiting (DISSOLVE IN THE MOUTH).  06/05/17  Yes Ward, Delice Bison, DO  oxyCODONE-acetaminophen (PERCOCET) 10-325 MG tablet Take 1 tablet by mouth See admin instructions. Take 1 tablet by mouth every four to six hours as needed for pain   Yes [provider]  predniSONE (DELTASONE) 5 MG tablet Take 5 mg by mouth daily. 06/22/11  Yes [name], MD  PROVENTIL HFA 108 [phone] Base) MCG/ACT inhaler Inhale 2 puffs into the lungs 4 (four) times daily as needed for shortness of breath. 08/04/17  Yes [provider]  tacrolimus (PROGRAF) 1 MG capsule Take 2-3 mg by mouth See admin instructions. Take 3 mg by mouth in the morning and 2 mg at bedtime 06/22/11  Yes [name], MD  Vitamin D, Ergocalciferol,  (DRISDOL) 1.25 MG (50000 UT) CAPS capsule Take 1 capsule (50,000 Units total) by mouth every 7 (seven) days. Patient taking differently: Take 50,000 Units by mouth every Sunday.  12/20/18  Yes [name], FNP  zolpidem (AMBIEN) 10 MG tablet Take 10 mg by mouth at bedtime as needed for sleep.   Yes [provider]  potassium chloride SA (KLOR-CON) 20 MEQ tablet Take 1 tablet (20 mEq total) by mouth 2 (two) times a week. On Mondays and Fridays Patient not taking: Reported on 03/20/2019 03/09/19   Dowell Hoon, Shaune Pascal, MD  torsemide (DEMADEX) 20 MG tablet Take 3 tablets (60 mg total) by mouth  daily. Patient not taking: Reported on 03/20/2019 03/08/19   Destine Zirkle, Shaune Pascal, MD    Past Medical History: Past Medical History:  Diagnosis Date  . Bacteremia due to Gram-negative bacteria 05/23/2011  . Blind    right eye  . Blind right eye   . CHF (congestive heart failure) ([place])   . Chronic lower back pain   . Complication of anesthesia    "woke up during OR; I have an extremely high tolerance" (12/11/2011) 1 procedure was graft; the other procedures were procedures that are typically done with sedation.  . DDD (degenerative disc disease), cervical   . Depression   . Dysrhythmia    "tachycardia" (12/11/2011) new onset afib 10/15/14 EKG  . E coli bacteremia 06/18/2011  . Elevated LDL cholesterol level 12/2018  . ESRD (end stage renal disease) ([place]) 06/12/2011  . Fibromyalgia   . Gastroparesis   . Gastroparesis   . GERD (gastroesophageal reflux disease)   . Glaucoma    right eye  . Gout   . H/O carpal tunnel syndrome   . Headache(784.0)    "not often anymore" (12/11/2011)  . Herpes genitalia 1994  . History of blood transfusion    "more than a few times" (12/11/2011)  . History of stomach ulcers   . Hypotension   . Iron deficiency anemia   . New onset a-fib ([place])    10/15/14 EKG  . Osteopenia   . Peripheral neuropathy 11/2018  . Seizures ([place]) 1994   "post transplant; only have  had that one" (12/11/2011)  . Spinal stenosis in cervical region   . Stroke [facility])     left basal ganglia lacunar infarct; Right frontal lobe lacunar infarct.  . Stroke [facility]) ~ 1999; 2001   "briefly lost my vision; lost my right eye" (12/11/2011)  . Vitamin D deficiency 12/2018    Past Surgical History: Past Surgical History:  Procedure Laterality Date  . ANTERIOR CERVICAL DECOMP/DISCECTOMY FUSION N/A 01/08/2015   Procedure: Anterior Cervical Three-Four/Four-Five Decompression/Diskectomy/Fusion;  Surgeon: Leeroy Cha, MD;  Location: [place] [facility];  Service: Neurosurgery;  Laterality: N/A;  C3-4 C4-5 Anterior cervical decompression/diskectomy/fusion  . APPENDECTOMY  ~ 2004  . CATARACT EXTRACTION     right eye  . COLONOSCOPY    . ENUCLEATION  2001   "right"  . ESOPHAGOGASTRODUODENOSCOPY (EGD) WITH PROPOFOL N/A 04/21/2012   Procedure: ESOPHAGOGASTRODUODENOSCOPY (EGD) WITH PROPOFOL;  Surgeon: Milus Banister, MD;  Location: [facility] END[facility]COPY;  Service: Endoscopy;  Laterality: N/A;  . 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 [place]; 1999; 2005   "right"  . MULTIPLE TOOTH EXTRACTIONS    . TONSILLECTOMY    . TOTAL NEPHRECTOMY  1988?; 1994; 2005    Family History: Family History  Problem Relation Age of Onset  . Glaucoma Mother   . Pancreatic cancer Father   . Multiple sclerosis Brother   . Hypertension Maternal Grandmother   . Breast cancer Neg Hx     Social History: Social History   Socioeconomic History  . Marital status: Single    Spouse name: Not on file  . Number of children: 0  . Years of education: some college  . Highest education level: Not on file  Occupational History  . Occupation: disabled    Fish farm manager: UNEMPLOYED  Tobacco Use  . Smoking status: Current Every Day Smoker    Packs/day: 0.20    Years: 28.00  Pack years: 5.60    Types: Cigarettes  . Smokeless tobacco: Never Used    Substance and Sexual Activity  . Alcohol use: Yes    Comment: rare  . Drug use: Yes    Types: Marijuana    Comment: occasional use  . Sexual activity: Yes  Other Topics Concern  . Not on file  Social History Narrative   Right-handed.   Four cups caffeine per day.   Lives alone.   Social Determinants of Health   Financial Resource Strain:   . Difficulty of Paying Living Expenses: Not on file  Food Insecurity:   . Worried About Charity fundraiser in the Last Year: Not on file  . Ran Out of Food in the Last Year: Not on file  Transportation Needs:   . Lack of Transportation (Medical): Not on file  . Lack of Transportation (Non-Medical): Not on file  Physical Activity:   . Days of Exercise per Week: Not on file  . Minutes of Exercise per Session: Not on file  Stress:   . Feeling of Stress : Not on file  Social Connections:   . Frequency of Communication with Friends and Family: Not on file  . Frequency of Social Gatherings with Friends and Family: Not on file  . Attends Religious Services: Not on file  . Active Member of Clubs or Organizations: Not on file  . Attends Archivist Meetings: Not on file  . Marital Status: Not on file    Allergies:  Allergies  Allergen Reactions  . Levofloxacin Itching and Rash  . Tape Other (See Comments)    "Certain surgical tapes peel off my skin"    Objective:    Vital Signs:   Temp:  [98.1 F (36.7 C)-99 F (37.2 C)] 98.5 F (36.9 C) (02/16 0304) Pulse Rate:  [55-65] 55 (02/16 0304) Resp:  [16-17] 16 (02/16 0304) BP: (74-104)/(43-66) 104/63 (02/16 0500) SpO2:  [100 %] 100 % (02/15 1900) Weight:  [56.7 kg] 56.7 kg (02/15 1300)    Weight change: Filed Weights   03/20/19 1300  Weight: 56.7 kg    Intake/Output:   Intake/Output Summary (Last 24 hours) at 03/21/2019 1259 Last data filed at 03/21/2019 1122 Gross per 24 hour  Intake 62 ml  Output 3000 ml  Net -2938 ml      Physical Exam    General:   No  resp difficulty HEENT: normal r eye opacified Neck: supple. JVP 11-12  . Carotids 2+ bilat; no bruits. No lymphadenopathy or thyromegaly appreciated. Cor: PMI nondisplaced. Regular rate & rhythm. No rubs, gallops or murmurs. Lungs: clear Abdomen: soft, nontender, nondistended. No hepatosplenomegaly. No bruits or masses. Good bowel sounds. Extremities: no cyanosis, clubbing, rash, R and LLE 2+ edema Neuro: alert & orientedx3, cranial nerves grossly intact. moves all 4 extremities w/o difficulty. Affect pleasant   Telemetry   SR/SB 50-60s   EKG    Sinus Loletha Grayer on admit 56 bpm   Labs   Basic Metabolic Panel: Recent Labs  Lab 03/15/19 1624 03/20/19 1844 03/21/19 0256  NA 139 138 141  K 4.3 3.9 4.5  CL 105 102 102  CO2 24 25 26   GLUCOSE 99 93 75  BUN 38* 42* 38*  CREATININE 3.01* 2.90* 2.81*  CALCIUM 9.5 8.8* 9.3  PHOS  --  4.3  --     Liver Function Tests: Recent Labs  Lab 03/20/19 1844 03/21/19 0256  AST  --  29  ALT  --  17  ALKPHOS  --  106  BILITOT  --  1.1  PROT  --  5.6*  ALBUMIN 3.4* 3.6   No results for input(s): LIPASE, AMYLASE in the last 168 hours. No results for input(s): AMMONIA in the last 168 hours.  CBC: Recent Labs  Lab 03/21/19 0256  WBC 2.2*  HGB 9.2*  HCT 28.6*  MCV 100.4*  PLT 148*    Cardiac Enzymes: No results for input(s): CKTOTAL, CKMB, CKMBINDEX, TROPONINI in the last 168 hours.  BNP: BNP (last 3 results) Recent Labs    08/25/18 1709 03/08/19 1027 03/15/19 1624  BNP 104.8* 250.9* 204.4*    ProBNP (last 3 results) No results for input(s): PROBNP in the last 8760 hours.   CBG: No results for input(s): GLUCAP in the last 168 hours.  Coagulation Studies: No results for input(s): LABPROT, INR in the last 72 hours.   Imaging   DG Chest 2 View  Result Date: 03/20/2019 CLINICAL DATA:  Worsening kidney function, weight gain and swelling. EXAM: CHEST - 2 VIEW COMPARISON:  PA and lateral chest 08/25/2018. FINDINGS:  Vascular stent in the superior vena cava is identified. Heart size is normal. No consolidative process, edema, pneumothorax or pleural effusion. No acute or focal bony abnormality. IMPRESSION: No acute disease. Electronically Signed   By: Inge Rise M.D.   On: 03/20/2019 17:49   NM Pulmonary Perfusion  Result Date: 03/21/2019 CLINICAL DATA:  Pulmonary hypertension. Concern for chronic pulmonary embolism EXAM: NUCLEAR MEDICINE PERFUSION LUNG SCAN TECHNIQUE: Perfusion images were obtained in multiple projections after intravenous injection of radiopharmaceutical. Ventilation scans intentionally deferred if perfusion scan and chest x-ray adequate for interpretation during COVID 19 epidemic. RADIOPHARMACEUTICALS:  1.4 mCi Tc-15m MAA IV COMPARISON:  For radiograph 03/20/2019 FINDINGS: No wedge-shaped peripheral perfusion defects within the pulmonary arteries to suggest acute pulmonary embolism. Incidental note of mild uptake within liver suggesting reflux of MAA into the hepatic veins from RIGHT heart failure. RIGHT to LEFT cardiac shunt is less favored as there is no renal activity. IMPRESSION: No evidence acute or chronic pulmonary embolism. Electronically Signed   By: Suzy Bouchard M.D.   On: 03/21/2019 09:14   US Abdomen Complete  Result Date: 03/20/2019 CLINICAL DATA:  Renal transplant, bilateral nephrectomy, ascites, acute renal insufficiency EXAM: ABDOMEN ULTRASOUND COMPLETE COMPARISON:  08/09/2017, 08/25/2018 FINDINGS: Gallbladder: No gallstones or wall thickening visualized. No sonographic Murphy sign noted by sonographer. Common bile duct: Diameter: 3 mm Liver: There is mild nodularity of the liver capsule, with coarsened increased echotexture likely representing cirrhosis. No focal liver abnormalities. Portal vein is patent on color Doppler imaging with normal direction of blood flow towards the liver. IVC: No abnormality visualized. Pancreas: Visualized portion unremarkable. Spleen: Size and  appearance within normal limits. Right Kidney: Surgically absent Left Kidney: Length: Surgically absent Transplant kidney: Located in the right lower quadrant, 10.3 x 4.8 by 4.7 cm. 120 cc. Normal echotexture without focal mass. Abdominal aorta: No aneurysm visualized. Other findings: None. IMPRESSION: 1. Unremarkable right lower quadrant transplant kidney in a patient with a history of bilateral nephrectomies. 2. Heterogeneous liver echotexture, with nodular liver capsule suggesting cirrhosis. 3. Otherwise unremarkable exam. Electronically Signed   By: Randa Ngo M.D.   On: 03/20/2019 21:43      Medications:     Current Medications: . aspirin EC  81 mg Oral Daily  . azaTHIOprine  100 mg Oral Daily  . gabapentin  100 mg Oral BID  . latanoprost  1 drop Left  Eye QHS  . metolazone  2.5 mg Oral Once per day on Mon Thu  . pantoprazole  40 mg Oral Daily  . predniSONE  5 mg Oral Q breakfast  . tacrolimus  2 mg Oral QHS  . tacrolimus  3 mg Oral Daily  . Vitamin D (Ergocalciferol)  50,000 Units Oral Q7 days     Infusions: . furosemide         Assessment/Plan   1. A/C Diastolic HF  -ECHO EF 27-25% with Grade III DD. Marked volume overload.  - Continue high dose IV lasix + metolazone twice weekly. Kermit Balo response to IV lasix.  -Once diuresed will need RHC to further assess hemodynamics.   2. AKI/CKD Stage III -Creatinine baseline 2-2.5.  -Creatinine on admit 2.9>2.8  -Followed by Dr Marval Regal.   3. Chronic Immunosuppressive Therapy  -Had kidney transplant in 2005. -On prograf, prednisone, and imuran.   4. Tobacco Abuse -Discussed smoking cessation.   5. Gout -Uric Acid 13.1  - on chronic steroids.   Length of Stay: 1  Amy Clegg, NP  03/21/2019, 12:59 PM  Advanced Heart Failure Team Pager [phone] (M-F; 7a - 4p)  Please contact [facility] for night-coverage after hours (4p -7a ) and weekends on [email or web address]   Patient seen and examined with the above-signed  Advanced Practice Provider and/or Housestaff. I personally reviewed laboratory data, imaging studies and relevant notes. I independently examined the patient and formulated the important aspects of the plan. I have edited the note to reflect any of my changes or salient points. I have personally discussed the plan with the patient and/or family.  54 y/o woman with h/o ESRD s/p renal transplant now with CKD 3b-4 admitted with refractory lower extremity edema and worsening renal failure. Now responding to IV lasix  General:  Sitting in bed  No resp difficulty HEENT: normal Opacified R eye. Neck: supple. JVP 9-10 Carotids 2+ bilat; no bruits. No lymphadenopathy or thryomegaly appreciated. Cor: PMI nondisplaced. Regular rate & rhythm. No rubs, gallops or murmurs. Lungs: clear Abdomen: soft, nontender, nondistended. No hepatosplenomegaly. No bruits or masses. Good bowel sounds. Extremities: no cyanosis, clubbing, rash, 2+ edema Neuro: alert & orientedx3, cranial nerves grossly intact. moves all 4 extremities w/o difficulty. Affect pleasant  Volume status and renal function improving with IV lasix. Will continue. Can place UNNA boots as needed. Will plan RHC once diuresed or sooner if we can;t remove fluid. I think dietary indiscretion playing a major role here.   Glori Bickers, MD  2:56 PM

## 2019-03-21 NOTE — Progress Notes (Signed)
Patient ID: Tina Watkins, female   DOB: [birth year], 54 y.o.   MRN: [id number] S: feels better but sleepy since she was going to the restroom all night. O:BP 104/63   Pulse (!) 55   Temp 98.5 F (36.9 C) (Oral)   Resp 16   Wt 56.7 kg   SpO2 100%   BMI 26.35 kg/m   Intake/Output Summary (Last 24 hours) at 03/21/2019 1105 Last data filed at 03/21/2019 0800 Gross per 24 hour  Intake 62 ml  Output 2300 ml  Net -2238 ml   Intake/Output: I/O last 3 completed shifts: In: 91 [IV Piggyback:62] Out: 2000 [Urine:2000]  Intake/Output this shift:  Total I/O In: -  Out: 300 [Urine:300] Weight change:  Gen:NAD CVS: no rub Resp: bibasilar crackles Abd: +BS, soft, NT Ext: 1+ edema, less tense  Recent Labs  Lab 03/15/19 1624 03/20/19 1844 03/21/19 0256  NA 139 138 141  K 4.3 3.9 4.5  CL 105 102 102  CO2 24 25 26   GLUCOSE 99 93 75  BUN 38* 42* 38*  CREATININE 3.01* 2.90* 2.81*  ALBUMIN  --  3.4*  --   CALCIUM 9.5 8.8* 9.3  PHOS  --  4.3  --    Liver Function Tests: Recent Labs  Lab 03/20/19 1844  ALBUMIN 3.4*   No results for input(s): LIPASE, AMYLASE in the last 168 hours. No results for input(s): AMMONIA in the last 168 hours. CBC: Recent Labs  Lab 03/21/19 0256  WBC 2.2*  HGB 9.2*  HCT 28.6*  MCV 100.4*  PLT 148*   Cardiac Enzymes: No results for input(s): CKTOTAL, CKMB, CKMBINDEX, TROPONINI in the last 168 hours. CBG: No results for input(s): GLUCAP in the last 168 hours.  Iron Studies: No results for input(s): IRON, TIBC, TRANSFERRIN, FERRITIN in the last 72 hours. Studies/Results: DG Chest 2 View  Result Date: 03/20/2019 CLINICAL DATA:  Worsening kidney function, weight gain and swelling. EXAM: CHEST - 2 VIEW COMPARISON:  PA and lateral chest 08/25/2018. FINDINGS: Vascular stent in the superior vena cava is identified. Heart size is normal. No consolidative process, edema, pneumothorax or pleural effusion. No acute or focal bony abnormality.  IMPRESSION: No acute disease. Electronically Signed   By: Inge Rise M.D.   On: 03/20/2019 17:49   NM Pulmonary Perfusion  Result Date: 03/21/2019 CLINICAL DATA:  Pulmonary hypertension. Concern for chronic pulmonary embolism EXAM: NUCLEAR MEDICINE PERFUSION LUNG SCAN TECHNIQUE: Perfusion images were obtained in multiple projections after intravenous injection of radiopharmaceutical. Ventilation scans intentionally deferred if perfusion scan and chest x-ray adequate for interpretation during COVID 19 epidemic. RADIOPHARMACEUTICALS:  1.4 mCi Tc-70m MAA IV COMPARISON:  For radiograph 03/20/2019 FINDINGS: No wedge-shaped peripheral perfusion defects within the pulmonary arteries to suggest acute pulmonary embolism. Incidental note of mild uptake within liver suggesting reflux of MAA into the hepatic veins from RIGHT heart failure. RIGHT to LEFT cardiac shunt is less favored as there is no renal activity. IMPRESSION: No evidence acute or chronic pulmonary embolism. Electronically Signed   By: Suzy Bouchard M.D.   On: 03/21/2019 09:14   US Abdomen Complete  Result Date: 03/20/2019 CLINICAL DATA:  Renal transplant, bilateral nephrectomy, ascites, acute renal insufficiency EXAM: ABDOMEN ULTRASOUND COMPLETE COMPARISON:  08/09/2017, 08/25/2018 FINDINGS: Gallbladder: No gallstones or wall thickening visualized. No sonographic Murphy sign noted by sonographer. Common bile duct: Diameter: 3 mm Liver: There is mild nodularity of the liver capsule, with coarsened increased echotexture likely representing cirrhosis. No focal liver abnormalities. Portal vein  is patent on color Doppler imaging with normal direction of blood flow towards the liver. IVC: No abnormality visualized. Pancreas: Visualized portion unremarkable. Spleen: Size and appearance within normal limits. Right Kidney: Surgically absent Left Kidney: Length: Surgically absent Transplant kidney: Located in the right lower quadrant, 10.3 x 4.8 by 4.7 cm.  120 cc. Normal echotexture without focal mass. Abdominal aorta: No aneurysm visualized. Other findings: None. IMPRESSION: 1. Unremarkable right lower quadrant transplant kidney in a patient with a history of bilateral nephrectomies. 2. Heterogeneous liver echotexture, with nodular liver capsule suggesting cirrhosis. 3. Otherwise unremarkable exam. Electronically Signed   By: Randa Ngo M.D.   On: 03/20/2019 21:43   . aspirin EC  81 mg Oral Daily  . azaTHIOprine  100 mg Oral Daily  . gabapentin  100 mg Oral BID  . latanoprost  1 drop Left Eye QHS  . metolazone  2.5 mg Oral Once per day on Mon Thu  . pantoprazole  40 mg Oral Daily  . predniSONE  5 mg Oral Q breakfast  . tacrolimus  2 mg Oral QHS  . tacrolimus  3 mg Oral Daily  . Vitamin D (Ergocalciferol)  50,000 Units Oral Q7 days    BMET    Component Value Date/Time   NA 141 03/21/2019 0256   K 4.5 03/21/2019 0256   CL 102 03/21/2019 0256   CO2 26 03/21/2019 0256   GLUCOSE 75 03/21/2019 0256   BUN 38 (H) 03/21/2019 0256   CREATININE 2.81 (H) 03/21/2019 0256   CALCIUM 9.3 03/21/2019 0256   CALCIUM 9.5 12/30/2018 0935   GFRNONAA 19 (L) 03/21/2019 0256   GFRAA 22 (L) 03/21/2019 0256   CBC    Component Value Date/Time   WBC 2.2 (L) 03/21/2019 0256   RBC 2.85 (L) 03/21/2019 0256   HGB 9.2 (L) 03/21/2019 0256   HCT 28.6 (L) 03/21/2019 0256   PLT 148 (L) 03/21/2019 0256   MCV 100.4 (H) 03/21/2019 0256   MCH 32.3 03/21/2019 0256   MCHC 32.2 03/21/2019 0256   RDW 21.3 (H) 03/21/2019 0256   LYMPHSABS 0.4 (L) 11/19/2017 2059   MONOABS 0.3 11/19/2017 2059   EOSABS 0.1 11/19/2017 2059   BASOSABS 0.0 11/19/2017 2059    Assessment/Plan: 1.  Acute on chronic diastolic CHF- ECHO with grade 3 diastolic dysfunction.  Agree with admission for IV diuresis, especially in light of worsening renal function with po diuretics.  Have discussed the need for right heart cath once volume status has improved with Dr. Haroldine Laws. 1. Decrease  lasix to 120 mg bid given excellent response 2. Further workup per Heart Failure team 2. AKI/CKD stage 3- baseline Cr 2-2.5 and now rising with increased diuretics, likely due to cardiorenal syndrome.  We did discuss the possible need of HD if she does not respond to IV lasix. Scr has actually improved with diuresis. 3. HTN- low, not on meds and may benefit from midodrine but pending further cardiac workup. 4. Tobacco abuse- ongoing and stressed smoking cessation 5. Chronic immunosuppressive therapy- continue with prograf 3 mg qam and 2 mg qpm, prednisone 5 mg daily, and imuran 50 mg bid. 6. Nodularity of liver- will check hepatitis panel.  [name], MD [employer] [phone]

## 2019-03-21 NOTE — Progress Notes (Signed)
Lasix drip finished, pt BP 77/43 (55), asymptomatic. MD notified. Order placed for manual BP q15 mins x 4. Pt only let me get one BP which was 82/48. She states she does not want to be woken up every 15 mins. MD notified

## 2019-03-22 ENCOUNTER — Telehealth: Payer: Self-pay | Admitting: Physician Assistant

## 2019-03-22 DIAGNOSIS — R188 Other ascites: Secondary | ICD-10-CM

## 2019-03-22 LAB — CBC WITH DIFFERENTIAL/PLATELET
Abs Immature Granulocytes: 0.01 10*3/uL (ref 0.00–0.07)
Basophils Absolute: 0 10*3/uL (ref 0.0–0.1)
Basophils Relative: 0 %
Eosinophils Absolute: 0.1 10*3/uL (ref 0.0–0.5)
Eosinophils Relative: 3 %
HCT: 31.8 % — ABNORMAL LOW (ref 36.0–46.0)
Hemoglobin: 10.4 g/dL — ABNORMAL LOW (ref 12.0–15.0)
Immature Granulocytes: 0 %
Lymphocytes Relative: 23 %
Lymphs Abs: 0.6 10*3/uL — ABNORMAL LOW (ref 0.7–4.0)
MCH: 32.2 pg (ref 26.0–34.0)
MCHC: 32.7 g/dL (ref 30.0–36.0)
MCV: 98.5 fL (ref 80.0–100.0)
Monocytes Absolute: 0.1 10*3/uL (ref 0.1–1.0)
Monocytes Relative: 5 %
Neutro Abs: 1.8 10*3/uL (ref 1.7–7.7)
Neutrophils Relative %: 69 %
Platelets: 146 10*3/uL — ABNORMAL LOW (ref 150–400)
RBC: 3.23 MIL/uL — ABNORMAL LOW (ref 3.87–5.11)
RDW: 21.2 % — ABNORMAL HIGH (ref 11.5–15.5)
WBC: 2.6 10*3/uL — ABNORMAL LOW (ref 4.0–10.5)
nRBC: 0 % (ref 0.0–0.2)

## 2019-03-22 LAB — RENAL FUNCTION PANEL
Albumin: 3.7 g/dL (ref 3.5–5.0)
Anion gap: 13 (ref 5–15)
BUN: 42 mg/dL — ABNORMAL HIGH (ref 6–20)
CO2: 25 mmol/L (ref 22–32)
Calcium: 9.2 mg/dL (ref 8.9–10.3)
Chloride: 101 mmol/L (ref 98–111)
Creatinine, Ser: 2.8 mg/dL — ABNORMAL HIGH (ref 0.44–1.00)
GFR calc Af Amer: 22 mL/min — ABNORMAL LOW (ref >60)
GFR calc non Af Amer: 19 mL/min — ABNORMAL LOW (ref >60)
Glucose, Bld: 85 mg/dL (ref 70–99)
Phosphorus: 4.3 mg/dL (ref 2.5–4.6)
Potassium: 4.3 mmol/L (ref 3.5–5.1)
Sodium: 139 mmol/L (ref 135–145)

## 2019-03-22 LAB — HEPATIC FUNCTION PANEL
ALT: 16 U/L (ref 0–44)
AST: 26 U/L (ref 15–41)
Albumin: 3.7 g/dL (ref 3.5–5.0)
Alkaline Phosphatase: 108 U/L (ref 38–126)
Bilirubin, Direct: 0.2 mg/dL (ref 0.0–0.2)
Indirect Bilirubin: 1.5 mg/dL — ABNORMAL HIGH (ref 0.3–0.9)
Total Bilirubin: 1.7 mg/dL — ABNORMAL HIGH (ref 0.3–1.2)
Total Protein: 6.4 g/dL — ABNORMAL LOW (ref 6.5–8.1)

## 2019-03-22 LAB — HEPATITIS PANEL, ACUTE
HCV Ab: NONREACTIVE
Hep A IgM: NONREACTIVE
Hep B C IgM: NONREACTIVE
Hepatitis B Surface Ag: NONREACTIVE

## 2019-03-22 LAB — PROTIME-INR
INR: 1 (ref 0.8–1.2)
Prothrombin Time: 13.2 seconds (ref 11.4–15.2)

## 2019-03-22 MED ORDER — SODIUM CHLORIDE 0.9 % IV SOLN: 250.0000 mL | INTRAVENOUS | Status: DC | PRN

## 2019-03-22 MED ORDER — SODIUM CHLORIDE 0.9% FLUSH
3.0000 mL | Freq: Two times a day (BID) | INTRAVENOUS | Status: DC
  Administered 2019-03-22 – 2019-03-24 (×3): 3 mL via INTRAVENOUS

## 2019-03-22 MED ORDER — SODIUM CHLORIDE 0.9 % IV SOLN: INTRAVENOUS | Status: DC

## 2019-03-22 MED ORDER — SODIUM CHLORIDE 0.9% FLUSH: 3.0000 mL | INTRAVENOUS | Status: DC | PRN

## 2019-03-22 MED ORDER — ASPIRIN 81 MG PO CHEW
81.0000 mg | CHEWABLE_TABLET | ORAL | Status: AC
  Administered 2019-03-23: 81 mg via ORAL
  Filled 2019-03-22: qty 1

## 2019-03-22 NOTE — Progress Notes (Signed)
Patient ID: [name], female   DOB: 07-Sep-1965, 54 y.o.   MRN: [id number] S: Feels well except for her neck/shoulder pain.  Diuresed another 1.2 liters overnight O:BP (!) 93/57   Pulse 61   Temp 98.9 F (37.2 C) (Oral)   Resp 14   Ht 4\' 9"  (1.448 m)   Wt 54 kg   SpO2 99%   BMI 25.75 kg/m   Intake/Output Summary (Last 24 hours) at 03/22/2019 1311 Last data filed at 03/22/2019 1027 Gross per 24 hour  Intake 182 ml  Output 1450 ml  Net -1268 ml   Intake/Output: I/O last 3 completed shifts: In: 124 [IV Piggyback:124] Out: 3550 [Urine:3550]  Intake/Output this shift:  Total I/O In: 120 [P.O.:120] Out: 400 [Urine:400] Weight change: -2.722 kg Gen: NAD CVS: no rub Resp: cta Abd: +BS, soft, NT/ND Ext: trace pretibial edema  Recent Labs  Lab 03/15/19 1624 03/20/19 1844 03/21/19 0256 03/22/19 0246  NA 139 138 141 139  K 4.3 3.9 4.5 4.3  CL 105 102 102 101  CO2 24 25 26 25   GLUCOSE 99 93 75 85  BUN 38* 42* 38* 42*  CREATININE 3.01* 2.90* 2.81* 2.80*  ALBUMIN  --  3.4* 3.6 3.7  3.7  CALCIUM 9.5 8.8* 9.3 9.2  PHOS  --  4.3  --  4.3  AST  --   --  29 26  ALT  --   --  17 16   Liver Function Tests: Recent Labs  Lab 03/20/19 1844 03/21/19 0256 03/22/19 0246  AST  --  29 26  ALT  --  17 16  ALKPHOS  --  106 108  BILITOT  --  1.1 1.7*  PROT  --  5.6* 6.4*  ALBUMIN 3.4* 3.6 3.7  3.7   No results for input(s): LIPASE, AMYLASE in the last 168 hours. No results for input(s): AMMONIA in the last 168 hours. CBC: Recent Labs  Lab 03/21/19 0256 03/22/19 0246  WBC 2.2* 2.6*  NEUTROABS  --  1.8  HGB 9.2* 10.4*  HCT 28.6* 31.8*  MCV 100.4* 98.5  PLT 148* 146*   Cardiac Enzymes: No results for input(s): CKTOTAL, CKMB, CKMBINDEX, TROPONINI in the last 168 hours. CBG: No results for input(s): GLUCAP in the last 168 hours.  Iron Studies: No results for input(s): IRON, TIBC, TRANSFERRIN, FERRITIN in the last 72 hours. Studies/Results: DG Chest 2  View  Result Date: 03/20/2019 CLINICAL DATA:  Worsening kidney function, weight gain and swelling. EXAM: CHEST - 2 VIEW COMPARISON:  PA and lateral chest 08/25/2018. FINDINGS: Vascular stent in the superior vena cava is identified. Heart size is normal. No consolidative process, edema, pneumothorax or pleural effusion. No acute or focal bony abnormality. IMPRESSION: No acute disease. Electronically Signed   By: Inge Rise M.D.   On: 03/20/2019 17:49   NM Pulmonary Perfusion  Result Date: 03/21/2019 CLINICAL DATA:  Pulmonary hypertension. Concern for chronic pulmonary embolism EXAM: NUCLEAR MEDICINE PERFUSION LUNG SCAN TECHNIQUE: Perfusion images were obtained in multiple projections after intravenous injection of radiopharmaceutical. Ventilation scans intentionally deferred if perfusion scan and chest x-ray adequate for interpretation during COVID 19 epidemic. RADIOPHARMACEUTICALS:  1.4 mCi Tc-71m MAA IV COMPARISON:  For radiograph 03/20/2019 FINDINGS: No wedge-shaped peripheral perfusion defects within the pulmonary arteries to suggest acute pulmonary embolism. Incidental note of mild uptake within liver suggesting reflux of MAA into the hepatic veins from RIGHT heart failure. RIGHT to LEFT cardiac shunt is less favored as there is no  renal activity. IMPRESSION: No evidence acute or chronic pulmonary embolism. Electronically Signed   By: Suzy Bouchard M.D.   On: 03/21/2019 09:14   US Abdomen Complete  Result Date: 03/20/2019 CLINICAL DATA:  Renal transplant, bilateral nephrectomy, ascites, acute renal insufficiency EXAM: ABDOMEN ULTRASOUND COMPLETE COMPARISON:  08/09/2017, 08/25/2018 FINDINGS: Gallbladder: No gallstones or wall thickening visualized. No sonographic Murphy sign noted by sonographer. Common bile duct: Diameter: 3 mm Liver: There is mild nodularity of the liver capsule, with coarsened increased echotexture likely representing cirrhosis. No focal liver abnormalities. Portal vein is  patent on color Doppler imaging with normal direction of blood flow towards the liver. IVC: No abnormality visualized. Pancreas: Visualized portion unremarkable. Spleen: Size and appearance within normal limits. Right Kidney: Surgically absent Left Kidney: Length: Surgically absent Transplant kidney: Located in the right lower quadrant, 10.3 x 4.8 by 4.7 cm. 120 cc. Normal echotexture without focal mass. Abdominal aorta: No aneurysm visualized. Other findings: None. IMPRESSION: 1. Unremarkable right lower quadrant transplant kidney in a patient with a history of bilateral nephrectomies. 2. Heterogeneous liver echotexture, with nodular liver capsule suggesting cirrhosis. 3. Otherwise unremarkable exam. Electronically Signed   By: Randa Ngo M.D.   On: 03/20/2019 21:43   . aspirin EC  81 mg Oral Daily  . azaTHIOprine  100 mg Oral Daily  . gabapentin  100 mg Oral BID  . heparin injection (subcutaneous)  5,000 Units Subcutaneous Q8H  . latanoprost  1 drop Left Eye QHS  . metolazone  2.5 mg Oral Once per day on Mon Thu  . pantoprazole  40 mg Oral BID  . predniSONE  5 mg Oral Q breakfast  . sodium chloride flush  3 mL Intravenous Q12H  . tacrolimus  2 mg Oral QHS  . tacrolimus  3 mg Oral Daily  . Vitamin D (Ergocalciferol)  50,000 Units Oral Q7 days    BMET    Component Value Date/Time   NA 139 03/22/2019 0246   K 4.3 03/22/2019 0246   CL 101 03/22/2019 0246   CO2 25 03/22/2019 0246   GLUCOSE 85 03/22/2019 0246   BUN 42 (H) 03/22/2019 0246   CREATININE 2.80 (H) 03/22/2019 0246   CALCIUM 9.2 03/22/2019 0246   CALCIUM 9.5 12/30/2018 0935   GFRNONAA 19 (L) 03/22/2019 0246   GFRAA 22 (L) 03/22/2019 0246   CBC    Component Value Date/Time   WBC 2.6 (L) 03/22/2019 0246   RBC 3.23 (L) 03/22/2019 0246   HGB 10.4 (L) 03/22/2019 0246   HCT 31.8 (L) 03/22/2019 0246   PLT 146 (L) 03/22/2019 0246   MCV 98.5 03/22/2019 0246   MCH 32.2 03/22/2019 0246   MCHC 32.7 03/22/2019 0246   RDW 21.2  (H) 03/22/2019 0246   LYMPHSABS 0.6 (L) 03/22/2019 0246   MONOABS 0.1 03/22/2019 0246   EOSABS 0.1 03/22/2019 0246   BASOSABS 0.0 03/22/2019 0246    Assessment/Plan: 1. Acute on chronic diastolic CHF- ECHO with grade 3 diastolic dysfunction. Agree with admission for IV diuresis, especially in light of worsening renal function with po diuretics. Have discussed the need for right heart cath once volume status has improved with Dr. Haroldine Laws. 1. Continue lasix to 120 mg bid given excellent response and hopefully transition to po torsemide tomorrow. 2. For right heart cath 03/23/19 per HF team 2. AKI/CKD stage 3- baseline Cr 2-2.5 and now rising with increased diuretics, likely due to cardiorenal syndrome. We did discuss the possible need of HD if she does not  respond to IV lasix. Scr has actually improved with diuresis. 1. Stable at 2.8 3. HTN- low, not on meds and may benefit from midodrine but pending further cardiac workup. 4. Tobacco abuse- ongoing and stressed smoking cessation 5. Chronic immunosuppressive therapy- continue with prograf 3 mg qam and 2 mg qpm, prednisone 5 mg daily, and imuran 50 mg bid. 6. Nodularity of liver- hepatitis panel negative. T. Bili up cont to follow.  Will check iron stores.  [name], MD [employer] [phone]

## 2019-03-22 NOTE — Progress Notes (Signed)
   03/22/19 1245  Mobility  Activity Ambulated in hall  Range of Motion Active;All extremities  Level of Assistance Independent  Assistive Device None  Minutes Ambulated 10 minutes  Distance Ambulated (ft) 480 ft  Mobility Response Tolerated well

## 2019-03-22 NOTE — Plan of Care (Signed)
  Problem: Education: Goal: Knowledge of General Education information will improve Description Including pain rating scale, medication(s)/side effects and non-pharmacologic comfort measures Outcome: Progressing   

## 2019-03-22 NOTE — Progress Notes (Signed)
Patient requesting pain medicine & current BP 93/67.  MD notified and states OK to give.   Patient also stating she wants IV pain medication.  MD aware and no changes to meds at this time.

## 2019-03-22 NOTE — Telephone Encounter (Signed)
Pt called stating she is in hospital [facility]) and will need to cancel appt w/[facility]. She stated they have taken many tests and wanted to know if they were any of the ones Amy Trellis Paganini was ordering from [facility]?   If not, could Amy  order them and have them done at hospital will she is there? Please advise   Pt is 4 east bed 7

## 2019-03-22 NOTE — Progress Notes (Addendum)
Advanced Heart Failure Rounding Note  PCP-Cardiologist: Evalina Field, MD   Subjective:    Yesterday diuresed with IV lasix. Neg 1.9 liters. Weight trending down from 125--->119 pounds.  Denies SOB. Frustrated requesting her pain medications for back pain.  Objective:   Weight Range: 54 kg Body mass index is 25.75 kg/m.   Vital Signs:   Temp:  [98.7 F (37.1 C)-99.3 F (37.4 C)] 98.9 F (37.2 C) (02/17 0611) Pulse Rate:  [61] 61 (02/16 2024) Resp:  [14-21] 14 (02/17 1026) BP: (92-110)/(56-66) 93/57 (02/17 1026) SpO2:  [98 %-100 %] 99 % (02/17 1026) Weight:  [54 kg] 54 kg (02/17 0512)    Weight change: Filed Weights   03/20/19 1300 03/22/19 0512  Weight: 56.7 kg 54 kg    Intake/Output:   Intake/Output Summary (Last 24 hours) at 03/22/2019 1035 Last data filed at 03/22/2019 1027 Gross per 24 hour  Intake 182 ml  Output 2150 ml  Net -1968 ml      Physical Exam    General:   Weak appearing No resp difficulty HEENT: Normal r eye opacified. L sclera anicteric Neck: Supple. JVP 7-8 . Carotids 2+ bilat; no bruits. No lymphadenopathy or thyromegaly appreciated. Cor: PMI nondisplaced. Regular rate & rhythm. 2/6 TR Lungs: Clear no wheeze Abdomen: Soft, nontender, nondistended. No hepatosplenomegaly. No bruits or masses. Good bowel sounds. Extremities: No cyanosis, clubbing, rash, R and LLE 1+ edema.  Neuro: Alert & orientedx3, cranial nerves grossly intact. moves all 4 extremities w/o difficulty. Affect pleasant   Telemetry   SR 60s personally reviewed.   Labs    CBC Recent Labs    03/21/19 0256 03/22/19 0246  WBC 2.2* 2.6*  NEUTROABS  --  1.8  HGB 9.2* 10.4*  HCT 28.6* 31.8*  MCV 100.4* 98.5  PLT 148* 001*   Basic Metabolic Panel Recent Labs    03/20/19 1844 03/20/19 1844 03/21/19 0256 03/22/19 0246  NA 138   < > 141 139  K 3.9   < > 4.5 4.3  CL 102   < > 102 101  CO2 25   < > 26 25  GLUCOSE 93   < > 75 85  BUN 42*   < > 38* 42*    CREATININE 2.90*   < > 2.81* 2.80*  CALCIUM 8.8*   < > 9.3 9.2  PHOS 4.3  --   --  4.3   < > = values in this interval not displayed.   Liver Function Tests Recent Labs    03/21/19 0256 03/22/19 0246  AST 29 26  ALT 17 16  ALKPHOS 106 108  BILITOT 1.1 1.7*  PROT 5.6* 6.4*  ALBUMIN 3.6 3.7  3.7   No results for input(s): LIPASE, AMYLASE in the last 72 hours. Cardiac Enzymes No results for input(s): CKTOTAL, CKMB, CKMBINDEX, TROPONINI in the last 72 hours.  BNP: BNP (last 3 results) Recent Labs    08/25/18 1709 03/08/19 1027 03/15/19 1624  BNP 104.8* 250.9* 204.4*    ProBNP (last 3 results) No results for input(s): PROBNP in the last 8760 hours.   D-Dimer No results for input(s): DDIMER in the last 72 hours. Hemoglobin A1C No results for input(s): HGBA1C in the last 72 hours. Fasting Lipid Panel No results for input(s): CHOL, HDL, LDLCALC, TRIG, CHOLHDL, LDLDIRECT in the last 72 hours. Thyroid Function Tests No results for input(s): TSH, T4TOTAL, T3FREE, THYROIDAB in the last 72 hours.  Invalid input(s): FREET3  Other results:  Imaging     No results found.   Medications:     Scheduled Medications: . aspirin EC  81 mg Oral Daily  . azaTHIOprine  100 mg Oral Daily  . gabapentin  100 mg Oral BID  . heparin injection (subcutaneous)  5,000 Units Subcutaneous Q8H  . latanoprost  1 drop Left Eye QHS  . metolazone  2.5 mg Oral Once per day on Mon Thu  . pantoprazole  40 mg Oral BID  . predniSONE  5 mg Oral Q breakfast  . tacrolimus  2 mg Oral QHS  . tacrolimus  3 mg Oral Daily  . Vitamin D (Ergocalciferol)  50,000 Units Oral Q7 days     Infusions: . furosemide 120 mg (03/22/19 0751)     PRN Medications:  acetaminophen **OR** acetaminophen, albuterol, diphenhydrAMINE, fluticasone, metoCLOPramide (REGLAN) injection, oxyCODONE-acetaminophen **AND** oxyCODONE, zolpidem   Assessment/Plan   1. A/C Diastolic HF  -ECHO EF 03-70% with Grade III  DD. Marked volume overload on admit - Volume status improving.  - Continue high dose IV lasix + metolazone twice weekly.  -Set up for RHC tomorrow.    2. AKI/CKD Stage III -Creatinine baseline 2-2.5.  -Creatinine on admit 2.9>2.8 >2.8 -Followed by Dr Marval Regal.   3. Chronic Immunosuppressive Therapy  -Had kidney transplant in 2005. -On prograf, prednisone, and imuran.   4. Tobacco Abuse -Discussed smoking cessation.   5. Gout -Uric Acid 13.1  - on chronic steroids.   Set up for RHC tomorrow.   Length of Stay: 2  Darrick Grinder, NP  03/22/2019, 10:35 AM  Advanced Heart Failure Team Pager [phone] (M-F; [place])  Please contact [facility] for night-coverage after hours (4p -7a ) and weekends on [email or web address]  Patient seen and examined with the above-signed Advanced Practice Provider and/or Housestaff. I personally reviewed laboratory data, imaging studies and relevant notes. I independently examined the patient and formulated the important aspects of the plan. I have edited the note to reflect any of my changes or salient points. I have personally discussed the plan with the patient and/or family.  Volume status improving with IV lasix. Creatinine stable. I suspect dietary indiscretion is major factor here. Will plan RHC tomorrow to formally assess hemodynamics after diuresis.   Glori Bickers, MD  6:11 PM

## 2019-03-22 NOTE — Progress Notes (Addendum)
PROGRESS NOTE    Tina Watkins  HUT:[id number] DOB: [birth year] DOA: 03/20/2019 PCP: [name], FNP   Brief Narrative: 54 year old with past medical history significant for end-stage renal disease, status post third kidney transplant July 6568, chronic diastolic CHF, chronic pain.  Patient was referred from her cardiologist office for direct admission for further evaluation of worsening kidney function, heart failure not responding to oral diuretics.  Assessment & Plan:   Acute on chronic diastolic CHF -Admitted with fluid overload resistant to oral diuretics -2D echo with EF of 60 to 65% and grade 2 diastolic dysfunction -Improving, CHF team following, continue high-dose IV Lasix and metolazone -She is -3.9 L -Creatinine stable to mild improvement with diuresis -Plan for right heart cath tomorrow  AKI on chronic kidney disease a stage IV: Patient creatinine baseline 2--0.5. History of kidney transplant 2005. Appreciate nephrology evaluation. -Clinically improving with diuresis, continue high-dose IV Lasix and metolazone  Status post renal transplant -Continue with Prograf, prednisone and Imuran.  Nodularity of liver by ultrasound ?  Cirrhosis -No history of alcoholism, follow-up hep C serology  Chronic pain syndrome: Continue with home dose oxycodone  Chronic gout  DVT prophylaxis: Heparin Code Status: Full code Family Communication: Care discussed with patient Disposition Plan: Home pending improvement in volume status, renal failure, right heart cath  Consultants:   Cardiology  Nephrology  Procedures:  Abdominal ultrasound:Unremarkable right lower quadrant transplant kidney in a patient with a history of bilateral nephrectomies. 2. Heterogeneous liver echotexture, with nodular liver capsule suggesting cirrhosis.  3. Otherwise unremarkable exam  Antimicrobials:    Subjective: -Reports that she is starting to feel better, breathing  improving, mild abdominal wall tightness  Objective: Vitals:   03/22/19 0000 03/22/19 0512 03/22/19 0611 03/22/19 1026  BP:   (!) 100/59 (!) 93/57  Pulse:      Resp:   15 14  Temp:   98.9 F (37.2 C)   TempSrc:   Oral   SpO2:   99% 99%  Weight:  54 kg    Height: 4\' 9"  (1.448 m)       Intake/Output Summary (Last 24 hours) at 03/22/2019 1147 Last data filed at 03/22/2019 1027 Gross per 24 hour  Intake 182 ml  Output 1450 ml  Net -1268 ml   Filed Weights   03/20/19 1300 03/22/19 0512  Weight: 56.7 kg 54 kg    Examination:  Gen: Awake, Alert, Oriented X 3, no distress HEENT: Positive JVD Lungs: Clear CVS: RRR,No Gallops,Rubs or new Murmurs Abd: soft, Non tender, non distended, BS present Extremities: 1+ edema Skin no rashes  Data Reviewed: I have personally reviewed following labs and imaging studies  CBC: Recent Labs  Lab 03/21/19 0256 03/22/19 0246  WBC 2.2* 2.6*  NEUTROABS  --  1.8  HGB 9.2* 10.4*  HCT 28.6* 31.8*  MCV 100.4* 98.5  PLT 148* 127*   Basic Metabolic Panel: Recent Labs  Lab 03/15/19 1624 03/20/19 1844 03/21/19 0256 03/22/19 0246  NA 139 138 141 139  K 4.3 3.9 4.5 4.3  CL 105 102 102 101  CO2 24 25 26 25   GLUCOSE 99 93 75 85  BUN 38* 42* 38* 42*  CREATININE 3.01* 2.90* 2.81* 2.80*  CALCIUM 9.5 8.8* 9.3 9.2  PHOS  --  4.3  --  4.3   GFR: Estimated Creatinine Clearance: 16.8 mL/min (A) (by C-G formula based on SCr of 2.8 mg/dL (H)). Liver Function Tests: Recent Labs  Lab 03/20/19 1844 03/21/19 0256 03/22/19  0246  AST  --  29 26  ALT  --  17 16  ALKPHOS  --  106 108  BILITOT  --  1.1 1.7*  PROT  --  5.6* 6.4*  ALBUMIN 3.4* 3.6 3.7  3.7   No results for input(s): LIPASE, AMYLASE in the last 168 hours. No results for input(s): AMMONIA in the last 168 hours. Coagulation Profile: Recent Labs  Lab 03/22/19 0821  INR 1.0   Cardiac Enzymes: No results for input(s): CKTOTAL, CKMB, CKMBINDEX, TROPONINI in the last 168  hours. BNP (last 3 results) No results for input(s): PROBNP in the last 8760 hours. HbA1C: No results for input(s): HGBA1C in the last 72 hours. CBG: No results for input(s): GLUCAP in the last 168 hours. Lipid Profile: No results for input(s): CHOL, HDL, LDLCALC, TRIG, CHOLHDL, LDLDIRECT in the last 72 hours. Thyroid Function Tests: No results for input(s): TSH, T4TOTAL, FREET4, T3FREE, THYROIDAB in the last 72 hours. Anemia Panel: No results for input(s): VITAMINB12, FOLATE, FERRITIN, TIBC, IRON, RETICCTPCT in the last 72 hours. Sepsis Labs: No results for input(s): PROCALCITON, LATICACIDVEN in the last 168 hours.  Recent Results (from the past 240 hour(s))  SARS CORONAVIRUS 2 (TAT 6-24 HRS) Nasopharyngeal Nasopharyngeal Swab     Status: None   Collection Time: 03/17/19  9:16 AM   Specimen: Nasopharyngeal Swab  Result Value Ref Range Status   SARS Coronavirus 2 NEGATIVE NEGATIVE Final    Comment: (NOTE) SARS-CoV-2 target nucleic acids are NOT DETECTED. The SARS-CoV-2 RNA is generally detectable in upper and lower respiratory specimens during the acute phase of infection. Negative results do not preclude SARS-CoV-2 infection, do not rule out co-infections with other pathogens, and should not be used as the sole basis for treatment or other patient management decisions. Negative results must be combined with clinical observations, patient history, and epidemiological information. The expected result is Negative. Fact Sheet for Patients: SugarRoll.be Fact Sheet for Healthcare Providers: https://www.woods-mathews.com/ This test is not yet approved or cleared by the [place] FDA and  has been authorized for detection and/or diagnosis of SARS-CoV-2 by FDA under an Emergency Use Authorization (EUA). This EUA will remain  in effect (meaning this test can be used) for the duration of the COVID-19 declaration under Section 56 4(b)(1) of the  Act, 21 U.S.C. section 360bbb-3(b)(1), unless the authorization is terminated or revoked sooner. Performed at [facility], [place] [address], [place], [place] [address]          Radiology Studies: DG Chest 2 View  Result Date: 03/20/2019 CLINICAL DATA:  Worsening kidney function, weight gain and swelling. EXAM: CHEST - 2 VIEW COMPARISON:  PA and lateral chest 08/25/2018. FINDINGS: Vascular stent in the superior vena cava is identified. Heart size is normal. No consolidative process, edema, pneumothorax or pleural effusion. No acute or focal bony abnormality. IMPRESSION: No acute disease. Electronically Signed   By: Inge Rise M.D.   On: 03/20/2019 17:49   NM Pulmonary Perfusion  Result Date: 03/21/2019 CLINICAL DATA:  Pulmonary hypertension. Concern for chronic pulmonary embolism EXAM: NUCLEAR MEDICINE PERFUSION LUNG SCAN TECHNIQUE: Perfusion images were obtained in multiple projections after intravenous injection of radiopharmaceutical. Ventilation scans intentionally deferred if perfusion scan and chest x-ray adequate for interpretation during COVID 19 epidemic. RADIOPHARMACEUTICALS:  1.4 mCi Tc-86m MAA IV COMPARISON:  For radiograph 03/20/2019 FINDINGS: No wedge-shaped peripheral perfusion defects within the pulmonary arteries to suggest acute pulmonary embolism. Incidental note of mild uptake within liver suggesting reflux of  MAA into the hepatic veins from RIGHT heart failure. RIGHT to LEFT cardiac shunt is less favored as there is no renal activity. IMPRESSION: No evidence acute or chronic pulmonary embolism. Electronically Signed   By: Suzy Bouchard M.D.   On: 03/21/2019 09:14   US Abdomen Complete  Result Date: 03/20/2019 CLINICAL DATA:  Renal transplant, bilateral nephrectomy, ascites, acute renal insufficiency EXAM: ABDOMEN ULTRASOUND COMPLETE COMPARISON:  08/09/2017, 08/25/2018 FINDINGS: Gallbladder: No gallstones or wall thickening visualized. No sonographic Murphy  sign noted by sonographer. Common bile duct: Diameter: 3 mm Liver: There is mild nodularity of the liver capsule, with coarsened increased echotexture likely representing cirrhosis. No focal liver abnormalities. Portal vein is patent on color Doppler imaging with normal direction of blood flow towards the liver. IVC: No abnormality visualized. Pancreas: Visualized portion unremarkable. Spleen: Size and appearance within normal limits. Right Kidney: Surgically absent Left Kidney: Length: Surgically absent Transplant kidney: Located in the right lower quadrant, 10.3 x 4.8 by 4.7 cm. 120 cc. Normal echotexture without focal mass. Abdominal aorta: No aneurysm visualized. Other findings: None. IMPRESSION: 1. Unremarkable right lower quadrant transplant kidney in a patient with a history of bilateral nephrectomies. 2. Heterogeneous liver echotexture, with nodular liver capsule suggesting cirrhosis. 3. Otherwise unremarkable exam. Electronically Signed   By: Randa Ngo M.D.   On: 03/20/2019 21:43        Scheduled Meds: . aspirin EC  81 mg Oral Daily  . azaTHIOprine  100 mg Oral Daily  . gabapentin  100 mg Oral BID  . heparin injection (subcutaneous)  5,000 Units Subcutaneous Q8H  . latanoprost  1 drop Left Eye QHS  . metolazone  2.5 mg Oral Once per day on Mon Thu  . pantoprazole  40 mg Oral BID  . predniSONE  5 mg Oral Q breakfast  . sodium chloride flush  3 mL Intravenous Q12H  . tacrolimus  2 mg Oral QHS  . tacrolimus  3 mg Oral Daily  . Vitamin D (Ergocalciferol)  50,000 Units Oral Q7 days   Continuous Infusions: . furosemide 120 mg (03/22/19 0751)     LOS: 2 days    Time spent: 35 minutes  Domenic Polite, MD [facility]   03/22/2019, 11:47 AM

## 2019-03-22 NOTE — Telephone Encounter (Signed)
Amy please review this.

## 2019-03-23 ENCOUNTER — Encounter (HOSPITAL_COMMUNITY)
Admission: AD | Disposition: A | Discharge status: Home / Self Care | Payer: Self-pay | Source: Ambulatory Visit | Attending: Internal Medicine

## 2019-03-23 ENCOUNTER — Other Ambulatory Visit: Payer: Medicaid Other

## 2019-03-23 HISTORY — PX: RIGHT HEART CATH: CATH118263

## 2019-03-23 LAB — POCT I-STAT EG7
Acid-base deficit: 2 mmol/L (ref 0.0–2.0)
Acid-base deficit: 2 mmol/L (ref 0.0–2.0)
Bicarbonate: 22.4 mmol/L (ref 20.0–28.0)
Bicarbonate: 24.1 mmol/L (ref 20.0–28.0)
Bicarbonate: 22.8 mmol/L (ref 20.0–28.0)
Calcium, Ion: 1.04 mmol/L — ABNORMAL LOW (ref 1.15–1.40)
Calcium, Ion: 1.18 mmol/L (ref 1.15–1.40)
Calcium, Ion: 0.99 mmol/L — ABNORMAL LOW (ref 1.15–1.40)
HCT: 34 % — ABNORMAL LOW (ref 36.0–46.0)
HCT: 36 % (ref 36.0–46.0)
HCT: 32 % — ABNORMAL LOW (ref 36.0–46.0)
Hemoglobin: 11.6 g/dL — ABNORMAL LOW (ref 12.0–15.0)
Hemoglobin: 12.2 g/dL (ref 12.0–15.0)
Hemoglobin: 10.9 g/dL — ABNORMAL LOW (ref 12.0–15.0)
O2 Saturation: 75 %
O2 Saturation: 76 %
O2 Saturation: 77 %
Potassium: 3.7 mmol/L (ref 3.5–5.1)
Potassium: 4 mmol/L (ref 3.5–5.1)
Potassium: 3.6 mmol/L (ref 3.5–5.1)
Sodium: 136 mmol/L (ref 135–145)
Sodium: 140 mmol/L (ref 135–145)
Sodium: 140 mmol/L (ref 135–145)
TCO2: 23 mmol/L (ref 22–32)
TCO2: 25 mmol/L (ref 22–32)
TCO2: 24 mmol/L (ref 22–32)
pCO2, Ven: 35.1 mmHg — ABNORMAL LOW (ref 44.0–60.0)
pCO2, Ven: 37.1 mmHg — ABNORMAL LOW (ref 44.0–60.0)
pCO2, Ven: 36.7 mmHg — ABNORMAL LOW (ref 44.0–60.0)
pH, Ven: 7.413 (ref 7.250–7.430)
pH, Ven: 7.421 (ref 7.250–7.430)
pH, Ven: 7.402 (ref 7.250–7.430)
pO2, Ven: 39 mmHg (ref 32.0–45.0)
pO2, Ven: 40 mmHg (ref 32.0–45.0)
pO2, Ven: 42 mmHg (ref 32.0–45.0)

## 2019-03-23 LAB — RENAL FUNCTION PANEL
Albumin: 3.8 g/dL (ref 3.5–5.0)
Anion gap: 15 (ref 5–15)
BUN: 48 mg/dL — ABNORMAL HIGH (ref 6–20)
CO2: 25 mmol/L (ref 22–32)
Calcium: 9.2 mg/dL (ref 8.9–10.3)
Chloride: 98 mmol/L (ref 98–111)
Creatinine, Ser: 3.63 mg/dL — ABNORMAL HIGH (ref 0.44–1.00)
GFR calc Af Amer: 16 mL/min — ABNORMAL LOW (ref >60)
GFR calc non Af Amer: 14 mL/min — ABNORMAL LOW (ref >60)
Glucose, Bld: 77 mg/dL (ref 70–99)
Phosphorus: 5.1 mg/dL — ABNORMAL HIGH (ref 2.5–4.6)
Potassium: 3.9 mmol/L (ref 3.5–5.1)
Sodium: 138 mmol/L (ref 135–145)

## 2019-03-23 LAB — CREATININE, SERUM
Creatinine, Ser: 4.02 mg/dL — ABNORMAL HIGH (ref 0.44–1.00)
GFR calc Af Amer: 14 mL/min — ABNORMAL LOW (ref >60)
GFR calc non Af Amer: 12 mL/min — ABNORMAL LOW (ref >60)

## 2019-03-23 LAB — CBC
HCT: 31.1 % — ABNORMAL LOW (ref 36.0–46.0)
HCT: 28.8 % — ABNORMAL LOW (ref 36.0–46.0)
Hemoglobin: 10.2 g/dL — ABNORMAL LOW (ref 12.0–15.0)
Hemoglobin: 9.6 g/dL — ABNORMAL LOW (ref 12.0–15.0)
MCH: 31.9 pg (ref 26.0–34.0)
MCH: 32.4 pg (ref 26.0–34.0)
MCHC: 32.8 g/dL (ref 30.0–36.0)
MCHC: 33.3 g/dL (ref 30.0–36.0)
MCV: 97.2 fL (ref 80.0–100.0)
MCV: 97.3 fL (ref 80.0–100.0)
Platelets: 149 10*3/uL — ABNORMAL LOW (ref 150–400)
Platelets: 148 10*3/uL — ABNORMAL LOW (ref 150–400)
RBC: 3.2 MIL/uL — ABNORMAL LOW (ref 3.87–5.11)
RBC: 2.96 MIL/uL — ABNORMAL LOW (ref 3.87–5.11)
RDW: 21.2 % — ABNORMAL HIGH (ref 11.5–15.5)
RDW: 20.9 % — ABNORMAL HIGH (ref 11.5–15.5)
WBC: 2.8 10*3/uL — ABNORMAL LOW (ref 4.0–10.5)
WBC: 2.5 10*3/uL — ABNORMAL LOW (ref 4.0–10.5)
nRBC: 0 % (ref 0.0–0.2)
nRBC: 0.8 % — ABNORMAL HIGH (ref 0.0–0.2)

## 2019-03-23 LAB — IRON AND TIBC
Iron: 100 ug/dL (ref 28–170)
Saturation Ratios: 51 % — ABNORMAL HIGH (ref 10.4–31.8)
TIBC: 195 ug/dL — ABNORMAL LOW (ref 250–450)
UIBC: 95 ug/dL

## 2019-03-23 LAB — POCT I-STAT 7, (LYTES, BLD GAS, ICA,H+H)
Acid-base deficit: 2 mmol/L (ref 0.0–2.0)
Bicarbonate: 21.6 mmol/L (ref 20.0–28.0)
Calcium, Ion: 1 mmol/L — ABNORMAL LOW (ref 1.15–1.40)
HCT: 34 % — ABNORMAL LOW (ref 36.0–46.0)
Hemoglobin: 11.6 g/dL — ABNORMAL LOW (ref 12.0–15.0)
O2 Saturation: 99 %
Potassium: 3.5 mmol/L (ref 3.5–5.1)
Sodium: 140 mmol/L (ref 135–145)
TCO2: 23 mmol/L (ref 22–32)
pCO2 arterial: 32.9 mmHg (ref 32.0–48.0)
pH, Arterial: 7.425 (ref 7.350–7.450)
pO2, Arterial: 131 mmHg — ABNORMAL HIGH (ref 83.0–108.0)

## 2019-03-23 LAB — FERRITIN: Ferritin: 1142 ng/mL — ABNORMAL HIGH (ref 11–307)

## 2019-03-23 SURGERY — RIGHT HEART CATH
Anesthesia: LOCAL

## 2019-03-23 MED ORDER — ENOXAPARIN SODIUM 40 MG/0.4ML ~~LOC~~ SOLN
30.0000 mg | SUBCUTANEOUS | Status: DC
  Administered 2019-03-24: 30 mg via SUBCUTANEOUS
  Filled 2019-03-23: qty 0.4

## 2019-03-23 MED ORDER — LIDOCAINE HCL (PF) 1 % IJ SOLN
INTRAMUSCULAR | Status: AC
  Filled 2019-03-23: qty 30

## 2019-03-23 MED ORDER — HEPARIN (PORCINE) IN NACL 1000-0.9 UT/500ML-% IV SOLN
INTRAVENOUS | Status: AC
  Filled 2019-03-23: qty 1000

## 2019-03-23 MED ORDER — MIDAZOLAM HCL 2 MG/2ML IJ SOLN
INTRAMUSCULAR | Status: AC
  Filled 2019-03-23: qty 2

## 2019-03-23 MED ORDER — MIDAZOLAM HCL 2 MG/2ML IJ SOLN
INTRAMUSCULAR | Status: DC | PRN
  Administered 2019-03-23: 1 mg via INTRAVENOUS
  Administered 2019-03-23: 2 mg via INTRAVENOUS

## 2019-03-23 MED ORDER — FENTANYL CITRATE (PF) 100 MCG/2ML IJ SOLN
INTRAMUSCULAR | Status: DC | PRN
  Administered 2019-03-23: 25 ug via INTRAVENOUS

## 2019-03-23 MED ORDER — ONDANSETRON HCL 4 MG/2ML IJ SOLN: 4.0000 mg | Freq: Four times a day (QID) | INTRAMUSCULAR | Status: DC | PRN

## 2019-03-23 MED ORDER — HEPARIN (PORCINE) IN NACL 1000-0.9 UT/500ML-% IV SOLN
INTRAVENOUS | Status: AC
  Filled 2019-03-23: qty 500

## 2019-03-23 MED ORDER — LIDOCAINE HCL (PF) 1 % IJ SOLN
INTRAMUSCULAR | Status: DC | PRN
  Administered 2019-03-23: 10 mL via INTRADERMAL

## 2019-03-23 MED ORDER — SODIUM CHLORIDE 0.9% FLUSH
3.0000 mL | Freq: Two times a day (BID) | INTRAVENOUS | Status: DC
  Administered 2019-03-24: 3 mL via INTRAVENOUS

## 2019-03-23 MED ORDER — HEPARIN (PORCINE) IN NACL 1000-0.9 UT/500ML-% IV SOLN
INTRAVENOUS | Status: DC | PRN
  Administered 2019-03-23 (×2): 500 mL

## 2019-03-23 MED ORDER — SODIUM CHLORIDE 0.9 % IV SOLN: INTRAVENOUS | Status: AC

## 2019-03-23 MED ORDER — FENTANYL CITRATE (PF) 100 MCG/2ML IJ SOLN
INTRAMUSCULAR | Status: AC
  Filled 2019-03-23: qty 2

## 2019-03-23 MED ORDER — ACETAMINOPHEN 325 MG PO TABS: 650.0000 mg | ORAL_TABLET | ORAL | Status: DC | PRN

## 2019-03-23 MED ORDER — HYDRALAZINE HCL 20 MG/ML IJ SOLN: 10.0000 mg | INTRAMUSCULAR | Status: AC | PRN

## 2019-03-23 MED ORDER — FENTANYL CITRATE (PF) 100 MCG/2ML IJ SOLN
INTRAMUSCULAR | Status: DC | PRN
  Administered 2019-03-23: 50 ug via INTRAVENOUS
  Administered 2019-03-23: 25 ug via INTRAVENOUS

## 2019-03-23 MED ORDER — LABETALOL HCL 5 MG/ML IV SOLN: 10.0000 mg | INTRAVENOUS | Status: AC | PRN

## 2019-03-23 MED ORDER — SODIUM CHLORIDE 0.9% FLUSH: 3.0000 mL | INTRAVENOUS | Status: DC | PRN

## 2019-03-23 MED ORDER — HEPARIN (PORCINE) IN NACL 1000-0.9 UT/500ML-% IV SOLN: INTRAVENOUS | Status: DC | PRN

## 2019-03-23 MED ORDER — SODIUM CHLORIDE 0.9 % IV SOLN: 250.0000 mL | INTRAVENOUS | Status: DC | PRN

## 2019-03-23 MED ORDER — MIDAZOLAM HCL 2 MG/2ML IJ SOLN
INTRAMUSCULAR | Status: DC | PRN
  Administered 2019-03-23: 1 mg via INTRAVENOUS

## 2019-03-23 SURGICAL SUPPLY — 14 items
CATH BALLN WEDGE 5F 110CM (CATHETERS) ×2 IMPLANT
CATH INFINITI 5FR MPB2 (CATHETERS) ×2 IMPLANT
CATH INFINITI JR4 5F (CATHETERS) ×2 IMPLANT
CATH SWAN GANZ 7F STRAIGHT (CATHETERS) ×2 IMPLANT
GUIDEWIRE .025 260CM (WIRE) ×2 IMPLANT
GUIDEWIRE ANGLED .035X260CM (WIRE) ×2 IMPLANT
PACK CARDIAC CATHETERIZATION (CUSTOM PROCEDURE TRAY) ×2 IMPLANT
SHEATH PINNACLE 7F 10CM (SHEATH) ×2 IMPLANT
SHEATH PROBE COVER 6X72 (BAG) ×2 IMPLANT
TRANSDUCER W/STOPCOCK (MISCELLANEOUS) ×2 IMPLANT
TUBING ART PRESS 72  MALE/FEM (TUBING) ×1
TUBING ART PRESS 72 MALE/FEM (TUBING) ×1 IMPLANT
WIRE EMERALD 3MM-J .025X260CM (WIRE) ×4 IMPLANT
WIRE HI TORQ VERSACORE-J 145CM (WIRE) ×2 IMPLANT

## 2019-03-23 NOTE — Progress Notes (Signed)
Patient ID: Tina Watkins, female   DOB: [birth year], 54 y.o.   MRN: [id number] S: Feels well and tolerated right heart cath well but unable to advance wire for PA pressures due to venous stenosis O:BP (!) 118/55   Pulse 64   Temp 98.7 F (37.1 C) (Oral)   Resp (!) 22   Ht 4\' 9"  (1.448 m)   Wt 53.6 kg   SpO2 98%   BMI 25.56 kg/m   Intake/Output Summary (Last 24 hours) at 03/23/2019 1227 Last data filed at 03/23/2019 0600 Gross per 24 hour  Intake 910.17 ml  Output 600 ml  Net 310.17 ml   Intake/Output: I/O last 3 completed shifts: In: 1092.2 [P.O.:980; I.V.:0.2; IV Piggyback:112] Out: 9509 [Urine:1650]  Intake/Output this shift:  No intake/output data recorded. Weight change: -0.408 kg Gen: NAD CVS: no rub Resp: cta Abd: benign Ext: no edema  Recent Labs  Lab 03/20/19 1844 03/21/19 0256 03/22/19 0246 03/23/19 0300 03/23/19 1046  NA 138 141 139 138  --   K 3.9 4.5 4.3 3.9  --   CL 102 102 101 98  --   CO2 25 26 25 25   --   GLUCOSE 93 75 85 77  --   BUN 42* 38* 42* 48*  --   CREATININE 2.90* 2.81* 2.80* 3.63* 4.02*  ALBUMIN 3.4* 3.6 3.7  3.7 3.8  --   CALCIUM 8.8* 9.3 9.2 9.2  --   PHOS 4.3  --  4.3 5.1*  --   AST  --  29 26  --   --   ALT  --  17 16  --   --    Liver Function Tests: Recent Labs  Lab 03/21/19 0256 03/22/19 0246 03/23/19 0300  AST 29 26  --   ALT 17 16  --   ALKPHOS 106 108  --   BILITOT 1.1 1.7*  --   PROT 5.6* 6.4*  --   ALBUMIN 3.6 3.7  3.7 3.8   No results for input(s): LIPASE, AMYLASE in the last 168 hours. No results for input(s): AMMONIA in the last 168 hours. CBC: Recent Labs  Lab 03/21/19 0256 03/21/19 0256 03/22/19 0246 03/23/19 0300 03/23/19 1046  WBC 2.2*   < > 2.6* 2.8* 2.5*  NEUTROABS  --   --  1.8  --   --   HGB 9.2*   < > 10.4* 10.2* 9.6*  HCT 28.6*   < > 31.8* 31.1* 28.8*  MCV 100.4*  --  98.5 97.2 97.3  PLT 148*   < > 146* 149* 148*   < > = values in this interval not displayed.   Cardiac  Enzymes: No results for input(s): CKTOTAL, CKMB, CKMBINDEX, TROPONINI in the last 168 hours. CBG: No results for input(s): GLUCAP in the last 168 hours.  Iron Studies:  Recent Labs    03/23/19 0300  IRON 100  TIBC 195*  FERRITIN 1,142*   Studies/Results: CARDIAC CATHETERIZATION  Result Date: 03/23/2019 Findings: 1. Low right-sided venous pressure with RA pressure of 40mmHG. 2. 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 Unable to obtain PA or PCWP pressures as described above. Due to difficulty of maneuvering the venous and R hear system during the procedure, I performed an echo after the procedure and there was no evidence of a pericardial effusion. Tina Bickers, MD 9:32 AM  . aspirin EC  81  mg Oral Daily  . azaTHIOprine  100 mg Oral Daily  . [START ON 03/24/2019] enoxaparin (LOVENOX) injection  30 mg Subcutaneous Q24H  . gabapentin  100 mg Oral BID  . latanoprost  1 drop Left Eye QHS  . pantoprazole  40 mg Oral BID  . predniSONE  5 mg Oral Q breakfast  . sodium chloride flush  3 mL Intravenous Q12H  . sodium chloride flush  3 mL Intravenous Q12H  . tacrolimus  2 mg Oral QHS  . tacrolimus  3 mg Oral Daily  . Vitamin D (Ergocalciferol)  50,000 Units Oral Q7 days    BMET    Component Value Date/Time   NA 138 03/23/2019 0300   K 3.9 03/23/2019 0300   CL 98 03/23/2019 0300   CO2 25 03/23/2019 0300   GLUCOSE 77 03/23/2019 0300   BUN 48 (H) 03/23/2019 0300   CREATININE 4.02 (H) 03/23/2019 1046   CALCIUM 9.2 03/23/2019 0300   CALCIUM 9.5 12/30/2018 0935   GFRNONAA 12 (L) 03/23/2019 1046   GFRAA 14 (L) 03/23/2019 1046   CBC    Component Value Date/Time   WBC 2.5 (L) 03/23/2019 1046   RBC 2.96 (L) 03/23/2019 1046   HGB 9.6 (L) 03/23/2019 1046   HCT 28.8 (L) 03/23/2019 1046   PLT 148 (L) 03/23/2019 1046   MCV 97.3 03/23/2019 1046   MCH 32.4 03/23/2019 1046   MCHC 33.3  03/23/2019 1046   RDW 20.9 (H) 03/23/2019 1046   LYMPHSABS 0.6 (L) 03/22/2019 0246   MONOABS 0.1 03/22/2019 0246   EOSABS 0.1 03/22/2019 0246   BASOSABS 0.0 03/22/2019 0246     Assessment/Plan: 1. Acute on chronic diastolic CHF- ECHO with grade 3 diastolic dysfunction. Agree with admission for IV diuresis, especially in light of worsening renal function with po diuretics. Have discussed the need for right heart cath once volume status has improved with Dr. Haroldine Laws. 1. Difficult right heart cath due to venous stenoses (history of multiple AVF/AVG/HD catheters) but cvp low at 3 so will hold diuretics and follow. 2. AKI/CKD stage 3- baseline Cr 2-2.5 and now rising with increased diuretics, likely due to cardiorenal syndrome. We did discuss the possible need of HD if she does not respond to IV lasix.Scr has actually improved with diuresis. 1. Scr was stable at 2.8 but jumped up to 4 today likely due to over diuresis.  On right heart cath her CVP was 3 so will hold lasix IV today and give 250 ml of NS 2. Recheck labs tomorrow and if improving can be discharged with outpatient follow up. 3. Would not resume torsemide for another 24 hours and dose at 20 mg bid with dry weight of 119 lbs.   3. HTN- low, not on meds and may benefit from midodrine but pending further cardiac workup. 4. Tobacco abuse- ongoing and stressed smoking cessation 5. Chronic immunosuppressive therapy- continue with prograf 3 mg qam and 2 mg qpm, prednisone 5 mg daily, and imuran 50 mg bid. 6. Nodularity of liver- hepatitis panel negative. T. Bili up cont to follow.  Will check iron stores. 7. Anemia of CKD- will give her epo 40,000 units sq today (she was due to have it at short stay tomorrow .  Tina Potts, MD [employer] [phone]

## 2019-03-23 NOTE — Progress Notes (Signed)
PROGRESS NOTE    Tina Watkins  GLO:[id number] DOB: 21-Aug-1965 DOA: 03/20/2019 PCP: [name], FNP   Brief Narrative: 54 year old with past medical history significant for end-stage renal disease, status post third kidney transplant July 1884, chronic diastolic CHF, chronic pain.  Patient was referred from her cardiologist office for direct admission for further evaluation of worsening kidney function, heart failure not responding to oral diuretics.  Assessment & Plan:   Acute on chronic diastolic CHF -Admitted with fluid overload resistant to oral diuretics -2D echo with EF of 60 to 65% and grade 2 diastolic dysfunction -Improving, CHF team following, diuresed with high-dose IV Lasix and metolazone -She is -4 L -Creatinine worse up to 3.6, concern for over diuresis, holding lasix,  -RHC: RA pressure was 3, plan for gentle IVF today  AKI on chronic kidney disease a stage IV: Patient creatinine baseline 2--0.5. History of kidney transplant 2005. Appreciate nephrology evaluation. -bump in creatinine to 3.6, IVF today  Status post renal transplant -Continue with Prograf, prednisone and Imuran.  Nodularity of liver by ultrasound ?  Cirrhosis -No history of alcoholism,  -hep C negative  Chronic pain syndrome: Continue with home dose oxycodone  Chronic gout  DVT prophylaxis: Heparin Code Status: Full code Family Communication: Care discussed with patient Disposition Plan: Home pending improvement in volume status, renal failure  Consultants:   Cardiology  Nephrology  Procedures:  Abdominal ultrasound:Unremarkable right lower quadrant transplant kidney in a patient with a history of bilateral nephrectomies. 2. Heterogeneous liver echotexture, with nodular liver capsule suggesting cirrhosis.  3. Otherwise unremarkable exam  Antimicrobials:    Subjective: -breathing better, no dyspnea today, denies dizziness  Objective: Vitals:   03/23/19  0910 03/23/19 0915 03/23/19 0920 03/23/19 1300  BP: (!) 105/53 (!) 105/46 (!) 118/55 (!) 80/32  Pulse: 62 (!) 59 64 63  Resp: (!) 22 (!) 32 (!) 22 20  Temp:    97.8 F (36.6 C)  TempSrc:    Oral  SpO2: 100% 100% 98% 96%  Weight:      Height:        Intake/Output Summary (Last 24 hours) at 03/23/2019 1405 Last data filed at 03/23/2019 0600 Gross per 24 hour  Intake 910.17 ml  Output 400 ml  Net 510.17 ml   Filed Weights   03/20/19 1300 03/22/19 0512 03/23/19 0626  Weight: 56.7 kg 54 kg 53.6 kg    Examination:  Gen: Awake, Alert, Oriented X 3, no distress HEENT: PERRLA, Neck supple, no JVD Lungs: CTAB CVS: S1S2/RRR Abd: soft, Non tender, non distended, BS present Extremities: trace edema Skin: no new rashes  Data Reviewed: I have personally reviewed following labs and imaging studies  CBC: Recent Labs  Lab 03/21/19 0256 03/21/19 0256 03/22/19 0246 03/22/19 0246 03/23/19 0300 03/23/19 1020 03/23/19 1025 03/23/19 1034 03/23/19 1046  WBC 2.2*  --  2.6*  --  2.8*  --   --   --  2.5*  NEUTROABS  --   --  1.8  --   --   --   --   --   --   HGB 9.2*   < > 10.4*   < > 10.2* 11.6* 12.2  11.6* 10.9* 9.6*  HCT 28.6*   < > 31.8*   < > 31.1* 34.0* 36.0  34.0* 32.0* 28.8*  MCV 100.4*  --  98.5  --  97.2  --   --   --  97.3  PLT 148*  --  146*  --  149*  --   --   --  148*   < > = values in this interval not displayed.   Basic Metabolic Panel: Recent Labs  Lab 03/20/19 1844 03/20/19 1844 03/21/19 0256 03/21/19 0256 03/22/19 0246 03/23/19 0300 03/23/19 1020 03/23/19 1025 03/23/19 1034 03/23/19 1046  NA 138   < > 141   < > 139 138 140 136  140 140  --   K 3.9   < > 4.5   < > 4.3 3.9 3.5 4.0  3.7 3.6  --   CL 102  --  102  --  101 98  --   --   --   --   CO2 25  --  26  --  25 25  --   --   --   --   GLUCOSE 93  --  75  --  85 77  --   --   --   --   BUN 42*  --  38*  --  42* 48*  --   --   --   --   CREATININE 2.90*  --  2.81*  --  2.80* 3.63*  --   --   --   4.02*  CALCIUM 8.8*  --  9.3  --  9.2 9.2  --   --   --   --   PHOS 4.3  --   --   --  4.3 5.1*  --   --   --   --    < > = values in this interval not displayed.   GFR: Estimated Creatinine Clearance: 11.7 mL/min (A) (by C-G formula based on SCr of 4.02 mg/dL (H)). Liver Function Tests: Recent Labs  Lab 03/20/19 1844 03/21/19 0256 03/22/19 0246 03/23/19 0300  AST  --  29 26  --   ALT  --  17 16  --   ALKPHOS  --  106 108  --   BILITOT  --  1.1 1.7*  --   PROT  --  5.6* 6.4*  --   ALBUMIN 3.4* 3.6 3.7  3.7 3.8   No results for input(s): LIPASE, AMYLASE in the last 168 hours. No results for input(s): AMMONIA in the last 168 hours. Coagulation Profile: Recent Labs  Lab 03/22/19 0821  INR 1.0   Cardiac Enzymes: No results for input(s): CKTOTAL, CKMB, CKMBINDEX, TROPONINI in the last 168 hours. BNP (last 3 results) No results for input(s): PROBNP in the last 8760 hours. HbA1C: No results for input(s): HGBA1C in the last 72 hours. CBG: No results for input(s): GLUCAP in the last 168 hours. Lipid Profile: No results for input(s): CHOL, HDL, LDLCALC, TRIG, CHOLHDL, LDLDIRECT in the last 72 hours. Thyroid Function Tests: No results for input(s): TSH, T4TOTAL, FREET4, T3FREE, THYROIDAB in the last 72 hours. Anemia Panel: Recent Labs    03/23/19 0300  FERRITIN 1,142*  TIBC 195*  IRON 100   Sepsis Labs: No results for input(s): PROCALCITON, LATICACIDVEN in the last 168 hours.  Recent Results (from the past 240 hour(s))  SARS CORONAVIRUS 2 (TAT 6-24 HRS) Nasopharyngeal Nasopharyngeal Swab     Status: None   Collection Time: 03/17/19  9:16 AM   Specimen: Nasopharyngeal Swab  Result Value Ref Range Status   SARS Coronavirus 2 NEGATIVE NEGATIVE Final    Comment: (NOTE) SARS-CoV-2 target nucleic acids are NOT DETECTED. The SARS-CoV-2 RNA is generally detectable in upper and lower respiratory specimens during the acute phase  of infection. Negative results do not preclude  SARS-CoV-2 infection, do not rule out co-infections with other pathogens, and should not be used as the sole basis for treatment or other patient management decisions. Negative results must be combined with clinical observations, patient history, and epidemiological information. The expected result is Negative. Fact Sheet for Patients: SugarRoll.be Fact Sheet for Healthcare Providers: https://www.woods-mathews.com/ This test is not yet approved or cleared by the [place] FDA and  has been authorized for detection and/or diagnosis of SARS-CoV-2 by FDA under an Emergency Use Authorization (EUA). This EUA will remain  in effect (meaning this test can be used) for the duration of the COVID-19 declaration under Section 56 4(b)(1) of the Act, 21 U.S.C. section 360bbb-3(b)(1), unless the authorization is terminated or revoked sooner. Performed at [facility], [place] [address], [place], [place] [address]          Radiology Studies: CARDIAC CATHETERIZATION  Result Date: 03/23/2019 Findings: 1. Low right-sided venous pressure with RA pressure of 9mmHG. 2. 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 Unable to obtain PA or PCWP pressures as described above. Due to difficulty of maneuvering the venous and R hear system during the procedure, I performed an echo after the procedure and there was no evidence of a pericardial effusion. Glori Bickers, MD 9:32 AM       Scheduled Meds: . aspirin EC  81 mg Oral Daily  . azaTHIOprine  100 mg Oral Daily  . [START ON 03/24/2019] enoxaparin (LOVENOX) injection  30 mg Subcutaneous Q24H  . gabapentin  100 mg Oral BID  . latanoprost  1 drop Left Eye QHS  . pantoprazole  40 mg Oral BID  . predniSONE  5 mg Oral Q breakfast  . sodium chloride flush  3 mL Intravenous Q12H  . sodium chloride flush  3 mL  Intravenous Q12H  . tacrolimus  2 mg Oral QHS  . tacrolimus  3 mg Oral Daily  . Vitamin D (Ergocalciferol)  50,000 Units Oral Q7 days   Continuous Infusions: . sodium chloride    . sodium chloride 50 mL/hr at 03/23/19 1310     LOS: 3 days    Time spent: 35 minutes  Domenic Polite, MD [facility]   03/23/2019, 2:05 PM

## 2019-03-23 NOTE — Progress Notes (Signed)
Plan of care reviewed. MD aware that BP was quit soft today after pain med given per RN day shift reported, at night time BP  83/46 - 91/64 mmHg, HR 62- 75, sinus rhythm on monitor. Pt's asymptomatic, she stated " this is her normal BP". No immediate distress. Complained having chronic pain on her left and right shoulder. Percocet and Oxycodone given PRN.  CHG bath give, consent form signed for cardiac cath today. CCTV education pre-cath given. All questions answered.   Continue to monitor.  Kennyth Lose, RN

## 2019-03-23 NOTE — Progress Notes (Signed)
Patient s/p heart cath with L groin access and flat until 1322.  Allowed patient elevated head up to 30 degrees per patient request and sit up at 1222.   Patient noted sitting up in bed with left leg bent and holding her IV tubing which she unhooked from the catheter.   Reminded patient the importance of laying flat and stated "well, I already got up and used the bathroom".   Assessed groin access site and noted no visible bleeded/firmness/level 0 Advised patient to lay flat for the allotted time period.

## 2019-03-23 NOTE — Progress Notes (Signed)
Advanced Heart Failure Rounding Note  PCP-Cardiologist: Evalina Field, MD   Subjective:    Diuresed well again overnight. Weight down another pound. Creatinine 2.8 -> 3.6  Breathing better. No SOB, orthopnea or PND.   Underwent attempted RHC this am via left femoral approach. CVP 3. Unable to obtain PA or wedge pressures due to inability to advance catheter further.    Objective:   Weight Range: 53.6 kg Body mass index is 25.56 kg/m.   Vital Signs:   Temp:  [97.2 F (36.2 C)-98.7 F (37.1 C)] 98.7 F (37.1 C) (02/18 0429) Pulse Rate:  [58-115] 64 (02/18 0920) Resp:  [0-32] 22 (02/18 0920) BP: (79-118)/(42-64) 118/55 (02/18 0920) SpO2:  [94 %-100 %] 98 % (02/18 0920) Weight:  [53.6 kg] 53.6 kg (02/18 0626) Last BM Date: 03/19/19(stated)  Weight change: Filed Weights   03/20/19 1300 03/22/19 0512 03/23/19 0626  Weight: 56.7 kg 54 kg 53.6 kg    Intake/Output:   Intake/Output Summary (Last 24 hours) at 03/23/2019 0936 Last data filed at 03/23/2019 0600 Gross per 24 hour  Intake 1030.17 ml  Output 1000 ml  Net 30.17 ml      Physical Exam    General:  Lying in bed No resp difficulty HEENT: normal R eye opacified Neck: supple. no JVD. Carotids 2+ bilat; no bruits. No lymphadenopathy or thryomegaly appreciated. Cor: PMI nondisplaced. Regular rate & rhythm. No rubs, gallops or murmurs. Lungs: clear Abdomen: soft, nontender, nondistended. No hepatosplenomegaly. No bruits or masses. Good bowel sounds. Extremities: no cyanosis, clubbing, rash, edema Neuro: alert & orientedx3, cranial nerves grossly intact. moves all 4 extremities w/o difficulty. Affect pleasant   Telemetry   SR 60-70s Personally reviewed   Labs    CBC Recent Labs    03/22/19 0246 03/23/19 0300  WBC 2.6* 2.8*  NEUTROABS 1.8  --   HGB 10.4* 10.2*  HCT 31.8* 31.1*  MCV 98.5 97.2  PLT 146* 786*   Basic Metabolic Panel Recent Labs    03/22/19 0246 03/23/19 0300  NA 139 138  K  4.3 3.9  CL 101 98  CO2 25 25  GLUCOSE 85 77  BUN 42* 48*  CREATININE 2.80* 3.63*  CALCIUM 9.2 9.2  PHOS 4.3 5.1*   Liver Function Tests Recent Labs    03/21/19 0256 03/21/19 0256 03/22/19 0246 03/23/19 0300  AST 29  --  26  --   ALT 17  --  16  --   ALKPHOS 106  --  108  --   BILITOT 1.1  --  1.7*  --   PROT 5.6*  --  6.4*  --   ALBUMIN 3.6   < > 3.7  3.7 3.8   < > = values in this interval not displayed.   No results for input(s): LIPASE, AMYLASE in the last 72 hours. Cardiac Enzymes No results for input(s): CKTOTAL, CKMB, CKMBINDEX, TROPONINI in the last 72 hours.  BNP: BNP (last 3 results) Recent Labs    08/25/18 1709 03/08/19 1027 03/15/19 1624  BNP 104.8* 250.9* 204.4*    ProBNP (last 3 results) No results for input(s): PROBNP in the last 8760 hours.   D-Dimer No results for input(s): DDIMER in the last 72 hours. Hemoglobin A1C No results for input(s): HGBA1C in the last 72 hours. Fasting Lipid Panel No results for input(s): CHOL, HDL, LDLCALC, TRIG, CHOLHDL, LDLDIRECT in the last 72 hours. Thyroid Function Tests No results for input(s): TSH, T4TOTAL, T3FREE, THYROIDAB in the  last 72 hours.  Invalid input(s): FREET3  Other results:   Imaging    CARDIAC CATHETERIZATION  Result Date: 03/23/2019 Findings: 1. Low right-sided venous pressure with RA pressure of 86mmHG. 2. 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 Unable to obtain PA or PCWP pressures as described above. Due to difficulty of maneuvering the venous and R hear system during the procedure, I performed an echo after the procedure and there was no evidence of a pericardial effusion. Glori Bickers, MD 9:32 AM    Medications:     Scheduled Medications: . [MAR Hold] aspirin EC  81 mg Oral Daily  . [MAR Hold] azaTHIOprine  100 mg Oral Daily  . [MAR Hold] gabapentin  100 mg Oral BID  . [MAR  Hold] heparin injection (subcutaneous)  5,000 Units Subcutaneous Q8H  . [MAR Hold] latanoprost  1 drop Left Eye QHS  . [MAR Hold] metolazone  2.5 mg Oral Once per day on Mon Thu  . [MAR Hold] pantoprazole  40 mg Oral BID  . [MAR Hold] predniSONE  5 mg Oral Q breakfast  . [MAR Hold] sodium chloride flush  3 mL Intravenous Q12H  . [MAR Hold] tacrolimus  2 mg Oral QHS  . [MAR Hold] tacrolimus  3 mg Oral Daily  . [MAR Hold] Vitamin D (Ergocalciferol)  50,000 Units Oral Q7 days    Infusions: . sodium chloride    . sodium chloride 10 mL/hr at 03/23/19 0559  . [MAR Hold] furosemide 120 mg (03/22/19 1841)    PRN Medications: sodium chloride, [MAR Hold] acetaminophen **OR** [MAR Hold] acetaminophen, [MAR Hold] albuterol, [MAR Hold] diphenhydrAMINE, fentaNYL, [MAR Hold] fluticasone, [MAR Hold] metoCLOPramide (REGLAN) injection, midazolam, [MAR Hold] oxyCODONE-acetaminophen **AND** [MAR Hold] oxyCODONE, sodium chloride flush, [MAR Hold] zolpidem   Assessment/Plan   1. A/C Diastolic HF  - ECHO EF 21-19% with Grade III DD. Marked volume overload on admit - Volume status much improved. Creatinine up. - RHC today with RA pressure of 3. This is as dry as we can get her.  - Hold diuretics. - Watch creatinine. If improves in am can go home on torsemide 20 bid + very cautious use of prn metoalzone - I suspect dietary indiscretion is major factor here.  - D/w Dr. Marval Regal  2. AKI/CKD Stage III -Creatinine baseline 2-2.5.  -Creatinine on admit 2.9>2.8 >2.8> 3.6 - Hold diuretics. See discussion above.  -Followed by Dr Marval Regal.   3. Chronic Immunosuppressive Therapy  -Had kidney transplant in 2005. -On prograf, prednisone, and imuran.   4. Tobacco Abuse -Discussed smoking cessation.   5. Gout -Uric Acid 13.1  - on chronic steroids.   Length of Stay: 3  Glori Bickers, MD  03/23/2019, 9:36 AM  Advanced Heart Failure Team Pager [phone] (M-F; [place])  Please contact [facility] for night-coverage after hours (4p -7a ) and weekends on [email or web address]

## 2019-03-24 ENCOUNTER — Inpatient Hospital Stay (HOSPITAL_COMMUNITY): Admission: RE | Admit: 2019-03-24 | Payer: Medicaid Other | Source: Ambulatory Visit

## 2019-03-24 LAB — RENAL FUNCTION PANEL
Albumin: 4 g/dL (ref 3.5–5.0)
Anion gap: 16 — ABNORMAL HIGH (ref 5–15)
BUN: 53 mg/dL — ABNORMAL HIGH (ref 6–20)
CO2: 22 mmol/L (ref 22–32)
Calcium: 9.3 mg/dL (ref 8.9–10.3)
Chloride: 99 mmol/L (ref 98–111)
Creatinine, Ser: 3.76 mg/dL — ABNORMAL HIGH (ref 0.44–1.00)
GFR calc Af Amer: 15 mL/min — ABNORMAL LOW (ref >60)
GFR calc non Af Amer: 13 mL/min — ABNORMAL LOW (ref >60)
Glucose, Bld: 93 mg/dL (ref 70–99)
Phosphorus: 5.1 mg/dL — ABNORMAL HIGH (ref 2.5–4.6)
Potassium: 4.6 mmol/L (ref 3.5–5.1)
Sodium: 137 mmol/L (ref 135–145)

## 2019-03-24 LAB — CBC
HCT: 31.4 % — ABNORMAL LOW (ref 36.0–46.0)
Hemoglobin: 10.1 g/dL — ABNORMAL LOW (ref 12.0–15.0)
MCH: 32 pg (ref 26.0–34.0)
MCHC: 32.2 g/dL (ref 30.0–36.0)
MCV: 99.4 fL (ref 80.0–100.0)
Platelets: 139 10*3/uL — ABNORMAL LOW (ref 150–400)
RBC: 3.16 MIL/uL — ABNORMAL LOW (ref 3.87–5.11)
RDW: 20.9 % — ABNORMAL HIGH (ref 11.5–15.5)
WBC: 2.4 10*3/uL — ABNORMAL LOW (ref 4.0–10.5)
nRBC: 0.8 % — ABNORMAL HIGH (ref 0.0–0.2)

## 2019-03-24 MED ORDER — EPOETIN ALFA 40000 UNIT/ML IJ SOLN: 40000.0000 [IU] | Freq: Once | INTRAMUSCULAR | Status: DC

## 2019-03-24 MED ORDER — METOLAZONE 2.5 MG PO TABS: 2.5000 mg | ORAL_TABLET | Freq: Once | ORAL | Status: DC | PRN

## 2019-03-24 MED ORDER — TORSEMIDE 20 MG PO TABS: 20.0000 mg | ORAL_TABLET | Freq: Two times a day (BID) | ORAL | 0 refills | Status: DC

## 2019-03-24 MED ORDER — DARBEPOETIN ALFA 100 MCG/0.5ML IJ SOSY
100.0000 ug | PREFILLED_SYRINGE | Freq: Once | INTRAMUSCULAR | Status: AC
  Administered 2019-03-24: 100 ug via SUBCUTANEOUS
  Filled 2019-03-24: qty 0.5

## 2019-03-24 NOTE — Progress Notes (Signed)
Order received to discharge patient.  Telemetry monitor applied and CCMD made aware.  PIV access x2 removed.  Discharge instructions, follow up, medications and instructions for their use discussed with patient.

## 2019-03-24 NOTE — Discharge Summary (Signed)
Physician Discharge Summary  Tina Watkins FGH:[id number] DOB: 1965/05/27 DOA: 03/20/2019  PCP: [name], FNP  Admit date: 03/20/2019 Discharge date: 03/24/2019  Time spent: 35 minutes  Recommendations for Outpatient Follow-up:  Renal Dr.Colodonato in 1 week, check bmet in 1 week  Discharge Diagnoses:  Acute on chronic diastolic CHF AKI on CKD stage IV Status post renal transplant x3 Nodularity of the liver on ultrasound concerning for cirrhosis Chronic pain syndrome Chronic gout  Discharge Condition: stable  Diet recommendation: low sodium heart healthy  Filed Weights   03/22/19 0512 03/23/19 0626 03/24/19 0507  Weight: 54 kg 53.6 kg 55.1 kg    History of present illness:  54 year old with past medical history significant for end-stage renal disease, status post third kidney transplant July 6789, chronic diastolic CHF, chronic pain.  Patient was referred from her cardiologist office for direct admission for further evaluation of worsening kidney function, heart failure not responding to oral diuretics.  Hospital Course:   Acute on chronic diastolic CHF -Admitted with fluid overload resistant to oral diuretics -2D echo with EF of 60 to 65% and grade 2 diastolic dysfunction -Improving, CHF team following, diuresed with high-dose IV Lasix and metolazone -She is -4 L -Her creatinine trended up to 3.6, underwent right heart cath, procedure was not able to be completed due to access issues right atrial pressure was 3, given worsening creatinine with low-dose CVP she was given gentle fluid bolus yesterday, creatinine stable at 3.6 today, which is still slightly higher than her baseline but appears to have plateaued -She is chronically hypotensive at baseline but not symptomatic -Discussed with nephrology and heart failure team, she will hold diuretics today and restart torsemide 20 mg twice daily from tomorrow this was discussed with patient in detail, she also has  metolazone to be used weekly as needed for weight gain -Follow-up with Dr. Marval Regal already arranged for next week with labs  AKI on chronic kidney disease a stage IV: Patient creatinine baseline 2--0.5. History of kidney transplant 2005. -Creatinine 3.6 at discharge, see discussion above  Status post renal transplant x3 -This is her third transplanted kidney -Continue with Prograf, prednisone and Imuran.  Nodularity of liver by ultrasound, concerning for Cirrhosis -No history of alcoholism,  -hep C negative -? NASH -Follow-up with gastroenterology  Chronic pain syndrome: Continue with home dose oxycodone  Chronic gout    Discharge Exam: Vitals:   03/24/19 1000 03/24/19 1104  BP: (!) 76/57 (!) 95/54  Pulse:    Resp: 14 18  Temp:    SpO2:      General: AAOx3 Cardiovascular: S1S2/RRR Respiratory: CTAB  Discharge Instructions   Discharge Instructions    Diet - low sodium heart healthy   Complete by: As directed    Increase activity slowly   Complete by: As directed      Allergies as of 03/24/2019      Reactions   Levofloxacin Itching, Rash   Tape Other (See Comments)   "Certain surgical tapes peel off my skin"      Medication List    STOP taking these medications   furosemide 40 MG tablet Commonly known as: LASIX     TAKE these medications   ARANESP (ALBUMIN FREE) IJ Inject as directed every 14 (fourteen) days.   aspirin EC 81 MG tablet Take 81 mg by mouth daily.   azaTHIOprine 50 MG tablet Commonly known as: IMURAN Take 100 mg by mouth daily.   colchicine 0.6 MG tablet Take 0.6-1.2 mg  by mouth See admin instructions. Take 1.2 mg by mouth at onset, then take 0.6 mg three times a day as need for gout flare   diphenhydrAMINE 25 mg capsule Commonly known as: BENADRYL Take 25 mg by mouth at bedtime as needed for itching.   fluticasone 50 MCG/ACT nasal spray Commonly known as: FLONASE Place 1 spray into both nostrils daily as needed  for allergies or rhinitis.   gabapentin 100 MG capsule Commonly known as: NEURONTIN Take 100 mg by mouth 2 (two) times daily.   latanoprost 0.005 % ophthalmic solution Commonly known as: XALATAN Place 1 drop into the left eye at bedtime.   lidocaine 5 % Commonly known as: LIDODERM Place 1 patch onto the skin daily as needed (for pain- remove old patch first).   metoCLOPramide 10 MG tablet Commonly known as: Reglan Take 1 tablet (10 mg total) by mouth every 8 (eight) hours as needed for nausea. What changed: Another medication with the same name was changed. Make sure you understand how and when to take each.   metoCLOPramide 5 MG tablet Commonly known as: REGLAN TAKE 1 TABLET BY MOUTH TWICE A DAY What changed: when to take this   metolazone 2.5 MG tablet Commonly known as: ZAROXOLYN Take 1 tablet (2.5 mg total) by mouth once as needed for up to 1 dose (once a week if needed for weight gain). On Monday and Friday What changed:   when to take this  reasons to take this   nystatin 100000 UNIT/ML suspension Commonly known as: MYCOSTATIN Use as directed 5 mLs in the mouth or throat daily as needed (FOR THRUSH).   omeprazole 40 MG capsule Commonly known as: PRILOSEC Take 40 mg by mouth 2 (two) times daily.   ondansetron 4 MG disintegrating tablet Commonly known as: Zofran ODT Take 1 tablet (4 mg total) by mouth every 6 (six) hours as needed. What changed: reasons to take this   oxyCODONE-acetaminophen 10-325 MG tablet Commonly known as: PERCOCET Take 1 tablet by mouth See admin instructions. Take 1 tablet by mouth every four to six hours as needed for pain   predniSONE 5 MG tablet Commonly known as: DELTASONE Take 5 mg by mouth daily.   Proventil HFA 108 (90 Base) MCG/ACT inhaler Generic drug: albuterol Inhale 2 puffs into the lungs 4 (four) times daily as needed for shortness of breath.   tacrolimus 1 MG capsule Commonly known as: PROGRAF Take 2-3 mg by mouth  See admin instructions. Take 3 mg by mouth in the morning and 2 mg at bedtime   torsemide 20 MG tablet Commonly known as: DEMADEX Take 1 tablet (20 mg total) by mouth 2 (two) times daily. What changed:   how much to take  when to take this   Vitamin D (Ergocalciferol) 1.25 MG (50000 UNIT) Caps capsule Commonly known as: DRISDOL Take 1 capsule (50,000 Units total) by mouth every 7 (seven) days. What changed: when to take this   zolpidem 10 MG tablet Commonly known as: AMBIEN Take 10 mg by mouth at bedtime as needed for sleep.      Allergies  Allergen Reactions  . Levofloxacin Itching and Rash  . Tape Other (See Comments)    "Certain surgical tapes peel off my skin"      The results of significant diagnostics from this hospitalization (including imaging, microbiology, ancillary and laboratory) are listed below for reference.    Significant Diagnostic Studies: DG Chest 2 View  Result Date: 03/20/2019 CLINICAL DATA:  Worsening kidney function, weight gain and swelling. EXAM: CHEST - 2 VIEW COMPARISON:  PA and lateral chest 08/25/2018. FINDINGS: Vascular stent in the superior vena cava is identified. Heart size is normal. No consolidative process, edema, pneumothorax or pleural effusion. No acute or focal bony abnormality. IMPRESSION: No acute disease. Electronically Signed   By: Inge Rise M.D.   On: 03/20/2019 17:49   NM Pulmonary Perfusion  Result Date: 03/21/2019 CLINICAL DATA:  Pulmonary hypertension. Concern for chronic pulmonary embolism EXAM: NUCLEAR MEDICINE PERFUSION LUNG SCAN TECHNIQUE: Perfusion images were obtained in multiple projections after intravenous injection of radiopharmaceutical. Ventilation scans intentionally deferred if perfusion scan and chest x-ray adequate for interpretation during COVID 19 epidemic. RADIOPHARMACEUTICALS:  1.4 mCi Tc-27m MAA IV COMPARISON:  For radiograph 03/20/2019 FINDINGS: No wedge-shaped peripheral perfusion defects within the  pulmonary arteries to suggest acute pulmonary embolism. Incidental note of mild uptake within liver suggesting reflux of MAA into the hepatic veins from RIGHT heart failure. RIGHT to LEFT cardiac shunt is less favored as there is no renal activity. IMPRESSION: No evidence acute or chronic pulmonary embolism. Electronically Signed   By: Suzy Bouchard M.D.   On: 03/21/2019 09:14   US Abdomen Complete  Result Date: 03/20/2019 CLINICAL DATA:  Renal transplant, bilateral nephrectomy, ascites, acute renal insufficiency EXAM: ABDOMEN ULTRASOUND COMPLETE COMPARISON:  08/09/2017, 08/25/2018 FINDINGS: Gallbladder: No gallstones or wall thickening visualized. No sonographic Murphy sign noted by sonographer. Common bile duct: Diameter: 3 mm Liver: There is mild nodularity of the liver capsule, with coarsened increased echotexture likely representing cirrhosis. No focal liver abnormalities. Portal vein is patent on color Doppler imaging with normal direction of blood flow towards the liver. IVC: No abnormality visualized. Pancreas: Visualized portion unremarkable. Spleen: Size and appearance within normal limits. Right Kidney: Surgically absent Left Kidney: Length: Surgically absent Transplant kidney: Located in the right lower quadrant, 10.3 x 4.8 by 4.7 cm. 120 cc. Normal echotexture without focal mass. Abdominal aorta: No aneurysm visualized. Other findings: None. IMPRESSION: 1. Unremarkable right lower quadrant transplant kidney in a patient with a history of bilateral nephrectomies. 2. Heterogeneous liver echotexture, with nodular liver capsule suggesting cirrhosis. 3. Otherwise unremarkable exam. Electronically Signed   By: Randa Ngo M.D.   On: 03/20/2019 21:43   CARDIAC CATHETERIZATION  Result Date: 03/23/2019 Findings: 1. Low right-sided venous pressure with RA pressure of 57mmHG. 2. 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 Unable to obtain PA or PCWP pressures as described above. Due to difficulty of maneuvering the venous and R hear system during the procedure, I performed an echo after the procedure and there was no evidence of a pericardial effusion. Glori Bickers, MD 9:32 AM   Microbiology: Recent Results (from the past 240 hour(s))  SARS CORONAVIRUS 2 (TAT 6-24 HRS) Nasopharyngeal Nasopharyngeal Swab     Status: None   Collection Time: 03/17/19  9:16 AM   Specimen: Nasopharyngeal Swab  Result Value Ref Range Status   SARS Coronavirus 2 NEGATIVE NEGATIVE Final    Comment: (NOTE) SARS-CoV-2 target nucleic acids are NOT DETECTED. The SARS-CoV-2 RNA is generally detectable in upper and lower respiratory specimens during the acute phase of infection. Negative results do not preclude SARS-CoV-2 infection, do not rule out co-infections with other pathogens, and should not be used as the sole basis for treatment or other patient management decisions. Negative results must be combined with clinical observations, patient  history, and epidemiological information. The expected result is Negative. Fact Sheet for Patients: SugarRoll.be Fact Sheet for Healthcare Providers: https://www.woods-mathews.com/ This test is not yet approved or cleared by the [place] FDA and  has been authorized for detection and/or diagnosis of SARS-CoV-2 by FDA under an Emergency Use Authorization (EUA). This EUA will remain  in effect (meaning this test can be used) for the duration of the COVID-19 declaration under Section 56 4(b)(1) of the Act, 21 U.S.C. section 360bbb-3(b)(1), unless the authorization is terminated or revoked sooner. Performed at [facility], [place] [address], [place], [place] [address]      Labs: Basic Metabolic Panel: Recent Labs  Lab 03/20/19 1844 03/20/19 1844 03/21/19 0256 03/21/19 0256 03/22/19 0246 03/22/19 0246 03/23/19 0300  03/23/19 1020 03/23/19 1025 03/23/19 1034 03/23/19 1046 03/24/19 0237  NA 138   < > 141   < > 139   < > 138 140 136  140 140  --  137  K 3.9   < > 4.5   < > 4.3   < > 3.9 3.5 4.0  3.7 3.6  --  4.6  CL 102  --  102  --  101  --  98  --   --   --   --  99  CO2 25  --  26  --  25  --  25  --   --   --   --  22  GLUCOSE 93  --  75  --  85  --  77  --   --   --   --  93  BUN 42*  --  38*  --  42*  --  48*  --   --   --   --  53*  CREATININE 2.90*   < > 2.81*  --  2.80*  --  3.63*  --   --   --  4.02* 3.76*  CALCIUM 8.8*  --  9.3  --  9.2  --  9.2  --   --   --   --  9.3  PHOS 4.3  --   --   --  4.3  --  5.1*  --   --   --   --  5.1*   < > = values in this interval not displayed.   Liver Function Tests: Recent Labs  Lab 03/20/19 1844 03/21/19 0256 03/22/19 0246 03/23/19 0300 03/24/19 0237  AST  --  29 26  --   --   ALT  --  17 16  --   --   ALKPHOS  --  106 108  --   --   BILITOT  --  1.1 1.7*  --   --   PROT  --  5.6* 6.4*  --   --   ALBUMIN 3.4* 3.6 3.7  3.7 3.8 4.0   No results for input(s): LIPASE, AMYLASE in the last 168 hours. No results for input(s): AMMONIA in the last 168 hours. CBC: Recent Labs  Lab 03/21/19 0256 03/21/19 0256 03/22/19 0246 03/22/19 0246 03/23/19 0300 03/23/19 0300 03/23/19 1020 03/23/19 1025 03/23/19 1034 03/23/19 1046 03/24/19 0237  WBC 2.2*  --  2.6*  --  2.8*  --   --   --   --  2.5* 2.4*  NEUTROABS  --   --  1.8  --   --   --   --   --   --   --   --  HGB 9.2*   < > 10.4*   < > 10.2*   < > 11.6* 12.2  11.6* 10.9* 9.6* 10.1*  HCT 28.6*   < > 31.8*   < > 31.1*   < > 34.0* 36.0  34.0* 32.0* 28.8* 31.4*  MCV 100.4*  --  98.5  --  97.2  --   --   --   --  97.3 99.4  PLT 148*  --  146*  --  149*  --   --   --   --  148* 139*   < > = values in this interval not displayed.   Cardiac Enzymes: No results for input(s): CKTOTAL, CKMB, CKMBINDEX, TROPONINI in the last 168 hours. BNP: BNP (last 3 results) Recent Labs    08/25/18 1709  03/08/19 1027 03/15/19 1624  BNP 104.8* 250.9* 204.4*    ProBNP (last 3 results) No results for input(s): PROBNP in the last 8760 hours.  CBG: No results for input(s): GLUCAP in the last 168 hours.     Signed:  Domenic Polite MD.  [facility] 03/24/2019, 4:19 PM

## 2019-03-24 NOTE — Progress Notes (Signed)
Pt appeared quite sleepy at the beginning of night shift. She got out of bed and walked to bedside commode independently, alert and oriented x 4. Pt requested pain med for her chronic left shoulder pain. Oxycodone and Tylenol given PRN.   Left groin puncture site with dry gauze, original dressing from cath lab was dry and clean. Negative for hematoma, no bleeding noted. Education given for post cath and wound care. Pt expressed understanding.   BP 87/57 - 110/ 42 mmHg, MD aware about her low BP. She had very good oral intake. Discontinued IV fluid per order. HR 60s-70s, sinus rhythm on monitor, no ectopic beats RR 14-21, SPO2 96- 100 % on room air, Temp 98.9 F,   No immediate distress noted. Will continue to monitor.  Kennyth Lose, RN

## 2019-03-24 NOTE — Progress Notes (Signed)
Patient ID: Tina Watkins, female   DOB: 1965-06-17, 54 y.o.   MRN: [id number] S: Feeling better O:BP (!) 88/51   Pulse 60   Temp 98.9 F (37.2 C) (Oral)   Resp 18   Ht 4\' 9"  (1.448 m)   Wt 55.1 kg   SpO2 100%   BMI 26.27 kg/m   Intake/Output Summary (Last 24 hours) at 03/24/2019 0933 Last data filed at 03/24/2019 0748 Gross per 24 hour  Intake 1350.56 ml  Output 570 ml  Net 780.56 ml   Intake/Output: I/O last 3 completed shifts: In: 1900.7 [P.O.:1540; I.V.:310.7; IV Piggyback:50] Out: 850 [Urine:850]  Intake/Output this shift:  Total I/O In: -  Out: 120 [Urine:120] Weight change: 1.497 kg Gen: NAD CVS: no rub Resp: cta Abd: benign Ext: no edema  Recent Labs  Lab 03/20/19 1844 03/20/19 1844 03/21/19 0256 03/22/19 0246 03/23/19 0300 03/23/19 1020 03/23/19 1025 03/23/19 1034 03/23/19 1046 03/24/19 0237  NA 138   < > 141 139 138 140 136  140 140  --  137  K 3.9   < > 4.5 4.3 3.9 3.5 4.0  3.7 3.6  --  4.6  CL 102  --  102 101 98  --   --   --   --  99  CO2 25  --  26 25 25   --   --   --   --  22  GLUCOSE 93  --  75 85 77  --   --   --   --  93  BUN 42*  --  38* 42* 48*  --   --   --   --  53*  CREATININE 2.90*  --  2.81* 2.80* 3.63*  --   --   --  4.02* 3.76*  ALBUMIN 3.4*  --  3.6 3.7  3.7 3.8  --   --   --   --  4.0  CALCIUM 8.8*  --  9.3 9.2 9.2  --   --   --   --  9.3  PHOS 4.3  --   --  4.3 5.1*  --   --   --   --  5.1*  AST  --   --  29 26  --   --   --   --   --   --   ALT  --   --  17 16  --   --   --   --   --   --    < > = values in this interval not displayed.   Liver Function Tests: Recent Labs  Lab 03/21/19 0256 03/21/19 0256 03/22/19 0246 03/23/19 0300 03/24/19 0237  AST 29  --  26  --   --   ALT 17  --  16  --   --   ALKPHOS 106  --  108  --   --   BILITOT 1.1  --  1.7*  --   --   PROT 5.6*  --  6.4*  --   --   ALBUMIN 3.6   < > 3.7  3.7 3.8 4.0   < > = values in this interval not displayed.   No results for input(s):  LIPASE, AMYLASE in the last 168 hours. No results for input(s): AMMONIA in the last 168 hours. CBC: Recent Labs  Lab 03/21/19 0256 03/21/19 0256 03/22/19 0246 03/22/19 0246 03/23/19 0300 03/23/19 1020 03/23/19 1034 03/23/19 1046 03/24/19 0237  WBC 2.2*   < > 2.6*   < > 2.8*  --   --  2.5* 2.4*  NEUTROABS  --   --  1.8  --   --   --   --   --   --   HGB 9.2*   < > 10.4*   < > 10.2*   < > 10.9* 9.6* 10.1*  HCT 28.6*   < > 31.8*   < > 31.1*   < > 32.0* 28.8* 31.4*  MCV 100.4*  --  98.5  --  97.2  --   --  97.3 99.4  PLT 148*   < > 146*   < > 149*  --   --  148* 139*   < > = values in this interval not displayed.   Cardiac Enzymes: No results for input(s): CKTOTAL, CKMB, CKMBINDEX, TROPONINI in the last 168 hours. CBG: No results for input(s): GLUCAP in the last 168 hours.  Iron Studies:  Recent Labs    03/23/19 0300  IRON 100  TIBC 195*  FERRITIN 1,142*   Studies/Results: CARDIAC CATHETERIZATION  Result Date: 03/23/2019 Findings: 1. Low right-sided venous pressure with RA pressure of 51mmHG. 2. 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 Unable to obtain PA or PCWP pressures as described above. Due to difficulty of maneuvering the venous and R hear system during the procedure, I performed an echo after the procedure and there was no evidence of a pericardial effusion. Glori Bickers, MD 9:32 AM  . aspirin EC  81 mg Oral Daily  . azaTHIOprine  100 mg Oral Daily  . enoxaparin (LOVENOX) injection  30 mg Subcutaneous Q24H  . epoetin (EPOGEN/PROCRIT) injection  40,000 Units Subcutaneous Once  . gabapentin  100 mg Oral BID  . latanoprost  1 drop Left Eye QHS  . pantoprazole  40 mg Oral BID  . predniSONE  5 mg Oral Q breakfast  . sodium chloride flush  3 mL Intravenous Q12H  . sodium chloride flush  3 mL Intravenous Q12H  . tacrolimus  2 mg Oral QHS  . tacrolimus  3 mg Oral Daily   . Vitamin D (Ergocalciferol)  50,000 Units Oral Q7 days    BMET    Component Value Date/Time   NA 137 03/24/2019 0237   K 4.6 03/24/2019 0237   CL 99 03/24/2019 0237   CO2 22 03/24/2019 0237   GLUCOSE 93 03/24/2019 0237   BUN 53 (H) 03/24/2019 0237   CREATININE 3.76 (H) 03/24/2019 0237   CALCIUM 9.3 03/24/2019 0237   CALCIUM 9.5 12/30/2018 0935   GFRNONAA 13 (L) 03/24/2019 0237   GFRAA 15 (L) 03/24/2019 0237   CBC    Component Value Date/Time   WBC 2.4 (L) 03/24/2019 0237   RBC 3.16 (L) 03/24/2019 0237   HGB 10.1 (L) 03/24/2019 0237   HCT 31.4 (L) 03/24/2019 0237   PLT 139 (L) 03/24/2019 0237   MCV 99.4 03/24/2019 0237   MCH 32.0 03/24/2019 0237   MCHC 32.2 03/24/2019 0237   RDW 20.9 (H) 03/24/2019 0237   LYMPHSABS 0.6 (L) 03/22/2019 0246   MONOABS 0.1 03/22/2019 0246   EOSABS 0.1 03/22/2019 0246   BASOSABS 0.0 03/22/2019 0246     Assessment/Plan: 1. Acute on chronic diastolic CHF- ECHO with grade 3 diastolic dysfunction. Agree with admission for IV diuresis, especially in light of worsening renal function with  po diuretics. Have discussed the need for right heart cath once volume status has improved with Dr. Haroldine Laws. 1. Difficult right heart cath due to venous stenoses (history of multiple AVF/AVG/HD catheters) but cvp low at 3 so will hold diuretics and follow. 2. Continue to hold diuretics today and resume torsemide 20 mg bid tomorrow 2. AKI/CKD stage 3- baseline Cr 2-2.5 and now rising with increased diuretics, likely due to cardiorenal syndrome. We did discuss the possible need of HD if she does not respond to IV lasix.Scr has actually improved with diuresis. 1. Scr was stable at 2.8 but jumped up to 4 today likely due to over diuresis.  On right heart cath her CVP was 3 so will hold lasix IV today and given 250 ml of NS 2. She can be discharged to home today since she has an appointment with me next week and we can recheck her labs. 3. Would not resume  torsemide for another 24 hours and dose at 20 mg bid with dry weight of 119 lbs.   3. HTN- low, not on meds and may benefit from midodrine but pending further cardiac workup. 4. Tobacco abuse- ongoing and stressed smoking cessation 5. Chronic immunosuppressive therapy- continue with prograf 3 mg qam and 2 mg qpm, prednisone 5 mg daily, and imuran 50 mg bid. 6. Nodularity of liver- hepatitis panelnegative.T. Bili up cont to follow. Will check iron stores. 7. Anemia of CKD- will give her epo 40,000 units sq today (she was due to have it at short stay today) 8. Disposition- stable for discharge from renal standpoint.   [name], MD [employer] [phone]

## 2019-03-27 DIAGNOSIS — J189 Pneumonia, unspecified organism: Secondary | ICD-10-CM | POA: Diagnosis not present

## 2019-03-27 DIAGNOSIS — Z940 Kidney transplant status: Secondary | ICD-10-CM | POA: Diagnosis not present

## 2019-03-27 DIAGNOSIS — L908 Other atrophic disorders of skin: Secondary | ICD-10-CM | POA: Diagnosis not present

## 2019-03-27 DIAGNOSIS — N189 Chronic kidney disease, unspecified: Secondary | ICD-10-CM | POA: Diagnosis not present

## 2019-03-27 DIAGNOSIS — M4722 Other spondylosis with radiculopathy, cervical region: Secondary | ICD-10-CM | POA: Diagnosis not present

## 2019-03-27 DIAGNOSIS — N179 Acute kidney failure, unspecified: Secondary | ICD-10-CM | POA: Diagnosis not present

## 2019-03-27 DIAGNOSIS — I5032 Chronic diastolic (congestive) heart failure: Secondary | ICD-10-CM | POA: Diagnosis not present

## 2019-03-27 DIAGNOSIS — T8611 Kidney transplant rejection: Secondary | ICD-10-CM | POA: Diagnosis not present

## 2019-03-27 DIAGNOSIS — D631 Anemia in chronic kidney disease: Secondary | ICD-10-CM | POA: Diagnosis not present

## 2019-03-27 DIAGNOSIS — M109 Gout, unspecified: Secondary | ICD-10-CM | POA: Diagnosis not present

## 2019-03-27 DIAGNOSIS — K3184 Gastroparesis: Secondary | ICD-10-CM | POA: Diagnosis not present

## 2019-03-27 DIAGNOSIS — Z79899 Other long term (current) drug therapy: Secondary | ICD-10-CM | POA: Diagnosis not present

## 2019-03-27 NOTE — Telephone Encounter (Signed)
She has been having significant cardiac and nephrology issues. She is established with Dr. Ardis Hughs.  If she is feeling better from a GI standpoint she doesn't need a GI follow-up right now, if she still having problems, happy to see her back and/or schedule follow-up with Dr. Ardis Hughs in 3 to 4 weeks

## 2019-03-27 NOTE — Telephone Encounter (Signed)
Spoke with patient. She thanks me for the calll. On her way home from seeing her kidney transplant doctor. She will call us if she needs any GI concerns addressed.

## 2019-03-27 NOTE — Telephone Encounter (Signed)
Tina Watkins, patient has been discharged from the hospital.  Please call her and let her know that she did have an ultrasound with Doppler done while she was there which was the test that I had wanted her to have. If we still have that scheduled as an outpatient please cancel.  The ultrasound did not show any ascites/fluid in her abdomen but did show a somewhat nodular liver consistent with early cirrhosis. I do not think she needs any other GI imaging at present.

## 2019-03-27 NOTE — Telephone Encounter (Signed)
Any follow up that I should help her schedule?

## 2019-03-31 DIAGNOSIS — E7800 Pure hypercholesterolemia, unspecified: Secondary | ICD-10-CM | POA: Diagnosis not present

## 2019-03-31 DIAGNOSIS — Z79891 Long term (current) use of opiate analgesic: Secondary | ICD-10-CM | POA: Diagnosis not present

## 2019-03-31 DIAGNOSIS — Z79899 Other long term (current) drug therapy: Secondary | ICD-10-CM | POA: Diagnosis not present

## 2019-04-04 ENCOUNTER — Other Ambulatory Visit: Payer: Self-pay

## 2019-04-04 MED ORDER — METOCLOPRAMIDE HCL 5 MG PO TABS: 5.0000 mg | ORAL_TABLET | Freq: Two times a day (BID) | ORAL | 3 refills | Status: DC

## 2019-04-10 ENCOUNTER — Encounter (HOSPITAL_COMMUNITY)
Admission: RE | Admit: 2019-04-10 | Discharge: 2019-04-10 | Disposition: A | Discharge status: Home / Self Care | Payer: Medicaid Other | Source: Ambulatory Visit | Attending: Nephrology | Admitting: Nephrology

## 2019-04-10 ENCOUNTER — Other Ambulatory Visit: Payer: Self-pay

## 2019-04-10 DIAGNOSIS — N185 Chronic kidney disease, stage 5: Secondary | ICD-10-CM | POA: Diagnosis present

## 2019-04-10 DIAGNOSIS — D631 Anemia in chronic kidney disease: Secondary | ICD-10-CM

## 2019-04-10 DIAGNOSIS — Z940 Kidney transplant status: Secondary | ICD-10-CM

## 2019-04-10 LAB — COMPREHENSIVE METABOLIC PANEL
ALT: 14 U/L (ref 0–44)
AST: 22 U/L (ref 15–41)
Albumin: 3.7 g/dL (ref 3.5–5.0)
Alkaline Phosphatase: 103 U/L (ref 38–126)
Anion gap: 10 (ref 5–15)
BUN: 66 mg/dL — ABNORMAL HIGH (ref 6–20)
CO2: 24 mmol/L (ref 22–32)
Calcium: 9.1 mg/dL (ref 8.9–10.3)
Chloride: 105 mmol/L (ref 98–111)
Creatinine, Ser: 3.76 mg/dL — ABNORMAL HIGH (ref 0.44–1.00)
GFR calc Af Amer: 15 mL/min — ABNORMAL LOW (ref >60)
GFR calc non Af Amer: 13 mL/min — ABNORMAL LOW (ref >60)
Glucose, Bld: 86 mg/dL (ref 70–99)
Potassium: 4.4 mmol/L (ref 3.5–5.1)
Sodium: 139 mmol/L (ref 135–145)
Total Bilirubin: 1.1 mg/dL (ref 0.3–1.2)
Total Protein: 6.2 g/dL — ABNORMAL LOW (ref 6.5–8.1)

## 2019-04-10 LAB — POCT HEMOGLOBIN-HEMACUE: Hemoglobin: 8 g/dL — ABNORMAL LOW (ref 12.0–15.0)

## 2019-04-10 LAB — PHOSPHORUS: Phosphorus: 4.1 mg/dL (ref 2.5–4.6)

## 2019-04-10 MED ORDER — EPOETIN ALFA 20000 UNIT/ML IJ SOLN: 40000.0000 [IU] | INTRAMUSCULAR | Status: DC

## 2019-04-10 MED ORDER — EPOETIN ALFA 40000 UNIT/ML IJ SOLN
INTRAMUSCULAR | Status: AC
  Administered 2019-04-10: 40000 [IU] via SUBCUTANEOUS
  Filled 2019-04-10: qty 1

## 2019-04-11 LAB — PTH, INTACT AND CALCIUM
Calcium, Total (PTH): 9 mg/dL (ref 8.7–10.2)
PTH: 132 pg/mL — ABNORMAL HIGH (ref 15–65)

## 2019-04-17 ENCOUNTER — Other Ambulatory Visit: Payer: Self-pay

## 2019-04-17 ENCOUNTER — Emergency Department (HOSPITAL_COMMUNITY)
Admission: EM | Admit: 2019-04-17 | Discharge: 2019-04-17 | Disposition: A | Discharge status: Home / Self Care | Payer: Medicaid Other | Attending: Emergency Medicine | Admitting: Emergency Medicine

## 2019-04-17 ENCOUNTER — Encounter (HOSPITAL_COMMUNITY): Payer: Self-pay

## 2019-04-17 ENCOUNTER — Emergency Department (HOSPITAL_COMMUNITY): Payer: Medicaid Other

## 2019-04-17 DIAGNOSIS — Z7982 Long term (current) use of aspirin: Secondary | ICD-10-CM | POA: Insufficient documentation

## 2019-04-17 DIAGNOSIS — F17210 Nicotine dependence, cigarettes, uncomplicated: Secondary | ICD-10-CM | POA: Insufficient documentation

## 2019-04-17 DIAGNOSIS — I509 Heart failure, unspecified: Secondary | ICD-10-CM | POA: Diagnosis not present

## 2019-04-17 DIAGNOSIS — Z992 Dependence on renal dialysis: Secondary | ICD-10-CM | POA: Diagnosis not present

## 2019-04-17 DIAGNOSIS — R0602 Shortness of breath: Secondary | ICD-10-CM | POA: Diagnosis present

## 2019-04-17 DIAGNOSIS — I132 Hypertensive heart and chronic kidney disease with heart failure and with stage 5 chronic kidney disease, or end stage renal disease: Secondary | ICD-10-CM | POA: Insufficient documentation

## 2019-04-17 DIAGNOSIS — R9431 Abnormal electrocardiogram [ECG] [EKG]: Secondary | ICD-10-CM | POA: Insufficient documentation

## 2019-04-17 DIAGNOSIS — N186 End stage renal disease: Secondary | ICD-10-CM | POA: Diagnosis not present

## 2019-04-17 DIAGNOSIS — I4581 Long QT syndrome: Secondary | ICD-10-CM | POA: Diagnosis not present

## 2019-04-17 DIAGNOSIS — Z940 Kidney transplant status: Secondary | ICD-10-CM | POA: Diagnosis not present

## 2019-04-17 DIAGNOSIS — J40 Bronchitis, not specified as acute or chronic: Secondary | ICD-10-CM | POA: Diagnosis not present

## 2019-04-17 DIAGNOSIS — Z79899 Other long term (current) drug therapy: Secondary | ICD-10-CM | POA: Insufficient documentation

## 2019-04-17 LAB — CBC WITH DIFFERENTIAL/PLATELET
Abs Immature Granulocytes: 0 10*3/uL (ref 0.00–0.07)
Basophils Absolute: 0 10*3/uL (ref 0.0–0.1)
Basophils Relative: 0 %
Eosinophils Absolute: 0 10*3/uL (ref 0.0–0.5)
Eosinophils Relative: 1 %
HCT: 26.6 % — ABNORMAL LOW (ref 36.0–46.0)
Hemoglobin: 8.3 g/dL — ABNORMAL LOW (ref 12.0–15.0)
Lymphocytes Relative: 13 %
Lymphs Abs: 0.3 10*3/uL — ABNORMAL LOW (ref 0.7–4.0)
MCH: 33.6 pg (ref 26.0–34.0)
MCHC: 31.2 g/dL (ref 30.0–36.0)
MCV: 107.7 fL — ABNORMAL HIGH (ref 80.0–100.0)
Monocytes Absolute: 0 10*3/uL — ABNORMAL LOW (ref 0.1–1.0)
Monocytes Relative: 0 %
Neutro Abs: 2.2 10*3/uL (ref 1.7–7.7)
Neutrophils Relative %: 86 %
Platelets: 156 10*3/uL (ref 150–400)
RBC: 2.47 MIL/uL — ABNORMAL LOW (ref 3.87–5.11)
RDW: 23.9 % — ABNORMAL HIGH (ref 11.5–15.5)
WBC: 2.5 10*3/uL — ABNORMAL LOW (ref 4.0–10.5)
nRBC: 2.8 % — ABNORMAL HIGH (ref 0.0–0.2)
nRBC: 3 /100 WBC — ABNORMAL HIGH

## 2019-04-17 LAB — URINALYSIS, ROUTINE W REFLEX MICROSCOPIC
Bacteria, UA: NONE SEEN
Bilirubin Urine: NEGATIVE
Glucose, UA: NEGATIVE mg/dL
Hgb urine dipstick: NEGATIVE
Ketones, ur: 5 mg/dL — AB
Leukocytes,Ua: NEGATIVE
Nitrite: NEGATIVE
Protein, ur: 30 mg/dL — AB
Specific Gravity, Urine: 1.012 (ref 1.005–1.030)
pH: 7 (ref 5.0–8.0)

## 2019-04-17 LAB — BASIC METABOLIC PANEL
Anion gap: 16 — ABNORMAL HIGH (ref 5–15)
BUN: 58 mg/dL — ABNORMAL HIGH (ref 6–20)
CO2: 17 mmol/L — ABNORMAL LOW (ref 22–32)
Calcium: 9.4 mg/dL (ref 8.9–10.3)
Chloride: 106 mmol/L (ref 98–111)
Creatinine, Ser: 3.54 mg/dL — ABNORMAL HIGH (ref 0.44–1.00)
GFR calc Af Amer: 16 mL/min — ABNORMAL LOW (ref >60)
GFR calc non Af Amer: 14 mL/min — ABNORMAL LOW (ref >60)
Glucose, Bld: 74 mg/dL (ref 70–99)
Potassium: 4.8 mmol/L (ref 3.5–5.1)
Sodium: 139 mmol/L (ref 135–145)

## 2019-04-17 LAB — TROPONIN I (HIGH SENSITIVITY)
Troponin I (High Sensitivity): 26 ng/L — ABNORMAL HIGH (ref <18)
Troponin I (High Sensitivity): 24 ng/L — ABNORMAL HIGH (ref <18)

## 2019-04-17 LAB — BRAIN NATRIURETIC PEPTIDE: B Natriuretic Peptide: 481.5 pg/mL — ABNORMAL HIGH (ref 0.0–100.0)

## 2019-04-17 MED ORDER — FENTANYL CITRATE (PF) 100 MCG/2ML IJ SOLN
100.0000 ug | Freq: Once | INTRAMUSCULAR | Status: AC
  Administered 2019-04-17: 100 ug via INTRAVENOUS
  Filled 2019-04-17: qty 2

## 2019-04-17 NOTE — ED Triage Notes (Signed)
Pt presents with SOB and weakness since Sunday. Pt reports she's having a "gastroperisis flare up" pt speaking in fragmented words, O2 sats 100% on RA. Pt states she's too weak to talk or move. Pt also reports pressure in her heart.

## 2019-04-17 NOTE — ED Notes (Signed)
Unable to start line on patient and get bloodwork. Contacted lab to attempt to straight stick patient to obtain labs.

## 2019-04-17 NOTE — Discharge Instructions (Addendum)
See Dr. Haroldine Laws, on Wednesday at the scheduled appointment.  Continue taking your usual medications and stay on your usual diet.  Ask Dr. Haroldine Laws to recheck your EKG, to follow-up on the prolonged QT that was seen on the EKG, today.

## 2019-04-17 NOTE — ED Provider Notes (Signed)
[facility] EMERGENCY DEPARTMENT Provider Note   CSN: [id number] Arrival date & time: 04/17/19  1416     History Chief Complaint  Patient presents with  . Shortness of Breath    Tina Watkins is a 54 y.o. female.  HPI She presents with the desire for receiving "saline and pain medicine."  She feels like she needs hydration because she had a bout of "gastroparesis," consisting of vomiting and diarrhea, yesterday and the day prior.  She denies fever.  She states that she occasionally coughs and produces sputum.  She takes oxycodone several times each day, chronically.  Her PCP is her chronic pain physician.  She was admitted with acute on chronic diastolic congestive heart failure, 03/20/2019.  She required diuresis, and had right heart catheterization.  Apparently the catheterization was not completely done, because of some access issues.  Creatinine low was stable at 3.6, during that admission.  Apparently her baseline renal function is closer to 2.      Past Medical History:  Diagnosis Date  . Bacteremia due to Gram-negative bacteria 05/23/2011  . Blind    right eye  . Blind right eye   . CHF (congestive heart failure) ([place])   . Chronic lower back pain   . Complication of anesthesia    "woke up during OR; I have an extremely high tolerance" (12/11/2011) 1 procedure was graft; the other procedures were procedures that are typically done with sedation.  . DDD (degenerative disc disease), cervical   . Depression   . Dysrhythmia    "tachycardia" (12/11/2011) new onset afib 10/15/14 EKG  . E coli bacteremia 06/18/2011  . Elevated LDL cholesterol level 12/2018  . ESRD (end stage renal disease) ([place]) 06/12/2011  . Fibromyalgia   . Gastroparesis   . Gastroparesis   . GERD (gastroesophageal reflux disease)   . Glaucoma    right eye  . Gout   . H/O carpal tunnel syndrome   . Headache(784.0)    "not often anymore" (12/11/2011)  . Herpes genitalia 1994  .  History of blood transfusion    "more than a few times" (12/11/2011)  . History of stomach ulcers   . Hypotension   . Iron deficiency anemia   . New onset a-fib ([place])    10/15/14 EKG  . Osteopenia   . Peripheral neuropathy 11/2018  . Seizures ([place]) 1994   "post transplant; only have had that one" (12/11/2011)  . Spinal stenosis in cervical region   . Stroke [facility])     left basal ganglia lacunar infarct; Right frontal lobe lacunar infarct.  . Stroke [facility]) ~ 1999; 2001   "briefly lost my vision; lost my right eye" (12/11/2011)  . Vitamin D deficiency 12/2018    Patient Active Problem List   Diagnosis Date Noted  . Swollen abdomen 01/04/2019  . DDD (degenerative disc disease), cervical 12/06/2018  . Neuropathy 12/06/2018  . Paresthesia 10/11/2018  . Neck pain 10/11/2018  . Tobacco abuse 11/20/2017  . Right leg swelling 11/20/2017  . CKD (chronic kidney disease), stage III 11/20/2017  . Right leg pain 11/20/2017  . Stroke (cerebrum) ([place]) 11/20/2017  . GERD (gastroesophageal reflux disease) 11/20/2017  . Acute venous embolism and thrombosis of deep vessels of proximal lower extremity, right ([place]) 11/20/2017  . 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 Thrush, oral   . AKI (acute kidney injury) ([place])   . Multifocal pneumonia 08/09/2017  . Chest  pain 09/29/2015  . Cervical stenosis of spinal canal 01/08/2015  . Urinary tract infectious disease   . Muscle spasms of neck 06/15/2014  . Bleeding hemorrhoid 06/15/2014  . Anemia in chronic kidney disease 06/15/2014  . Acute on chronic renal failure ([place]) 06/13/2014  . Pyelonephritis, acute 06/13/2014  . Sepsis ([place]) 06/13/2014  . History of kidney transplant   . Chronic pain syndrome 04/09/2012  . Dehydration, mild 04/09/2012  . Gout attack 06/23/2011  . Herpes infection 06/23/2011  . Anxiety 06/23/2011  . E coli bacteremia 06/18/2011  . ESRD (end stage renal disease) ([place]) 06/12/2011  .  UTI (lower urinary tract infection) 06/12/2011  . Bacteremia due to Gram-negative bacteria 05/23/2011  . History of renal transplantation 05/22/2011  . Septic shock(785.52) 05/22/2011  . Acute on chronic kidney failure ([place]) 05/22/2011  . Gastroparesis 04/24/2008  . WEIGHT LOSS 08/24/2007  . NAUSEA AND VOMITING 08/24/2007    Past Surgical History:  Procedure Laterality Date  . ANTERIOR CERVICAL DECOMP/DISCECTOMY FUSION N/A 01/08/2015   Procedure: Anterior Cervical Three-Four/Four-Five Decompression/Diskectomy/Fusion;  Surgeon: Leeroy Cha, MD;  Location: [facility];  Service: Neurosurgery;  Laterality: N/A;  C3-4 C4-5 Anterior cervical decompression/diskectomy/fusion  . APPENDECTOMY  ~ 2004  . CATARACT EXTRACTION     right eye  . COLONOSCOPY    . ENUCLEATION  2001   "right"  . ESOPHAGOGASTRODUODENOSCOPY (EGD) WITH PROPOFOL N/A 04/21/2012   Procedure: ESOPHAGOGASTRODUODENOSCOPY (EGD) WITH PROPOFOL;  Surgeon: Milus Banister, MD;  Location: [facility];  Service: Endoscopy;  Laterality: N/A;  . 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 [place]; 1999; 2005   "right"  . MULTIPLE TOOTH EXTRACTIONS    . RIGHT HEART CATH N/A 03/23/2019   Procedure: RIGHT HEART CATH;  Surgeon: Jolaine Artist, MD;  Location: [facility];  Service: Cardiovascular;  Laterality: N/A;  . TONSILLECTOMY    . TOTAL NEPHRECTOMY  1988?; 1994; 2005     OB History    Gravida  1   Para      Term      Preterm      AB  1   Living        SAB  1   TAB      Ectopic      Multiple      Live Births              Family History  Problem Relation Age of Onset  . Glaucoma Mother   . Pancreatic cancer Father   . Multiple sclerosis Brother   . Hypertension Maternal Grandmother   . Breast cancer Neg Hx     Social History   Tobacco Use  . Smoking status: Current Every Day Smoker    Packs/day: 0.20     Years: 28.00    Pack years: 5.60    Types: Cigarettes  . Smokeless tobacco: Never Used  Substance Use Topics  . Alcohol use: Yes    Comment: rare  . Drug use: Yes    Types: Marijuana    Comment: occasional use    Home Medications Prior to Admission medications   Medication Sig Start Date End Date Taking? Authorizing Provider  aspirin EC 81 MG tablet Take 81 mg by mouth daily.    Yes [provider]  azaTHIOprine (IMURAN) 50 MG tablet Take 100 mg by mouth daily.    Yes [provider]  colchicine 0.6 MG tablet Take 0.6-1.2 mg by mouth See admin instructions. Take 1.2 mg by mouth at onset, then take 0.6 mg three times a day as need for gout flare   Yes [provider]  Darbepoetin Alfa (ARANESP, ALBUMIN FREE, IJ) Inject as directed every 14 (fourteen) days.   Yes [provider]  diphenhydrAMINE (BENADRYL) 25 mg capsule Take 25 mg by mouth at bedtime as needed for itching.   Yes [provider]  fluticasone (FLONASE) 50 MCG/ACT nasal spray Place 1 spray into both nostrils daily as needed for allergies or rhinitis.   Yes [provider]  furosemide (LASIX) 40 MG tablet Take 40 mg by mouth daily.  04/04/19  Yes [provider]  gabapentin (NEURONTIN) 100 MG capsule Take 100 mg by mouth 2 (two) times daily.  09/27/18  Yes [provider]  latanoprost (XALATAN) 0.005 % ophthalmic solution Place 1 drop into the left eye at bedtime. 03/30/17  Yes [provider]  lidocaine (LIDODERM) 5 % Place 1 patch onto the skin daily as needed (for pain- remove old patch first).  10/28/18  Yes [provider]  metoCLOPramide (REGLAN) 5 MG tablet Take 1 tablet (5 mg total) by mouth 2 (two) times daily. 04/04/19  Yes Milus Banister, MD  nystatin (MYCOSTATIN) 100000 UNIT/ML suspension Use as directed 5 mLs in the mouth or throat daily as needed (FOR THRUSH).    Yes [provider]  omeprazole (PRILOSEC) 40 MG capsule  Take 40 mg by mouth 2 (two) times daily.   Yes [provider]  ondansetron (ZOFRAN ODT) 4 MG disintegrating tablet Take 1 tablet (4 mg total) by mouth every 6 (six) hours as needed. Patient taking differently: Take 4 mg by mouth every 6 (six) hours as needed for nausea or vomiting (DISSOLVE IN THE MOUTH).  06/05/17  Yes Ward, Delice Bison, DO  oxyCODONE-acetaminophen (PERCOCET) 10-325 MG tablet Take 1 tablet by mouth See admin instructions. Take 1 tablet by mouth every four to six hours as needed for pain   Yes [provider]  predniSONE (DELTASONE) 5 MG tablet Take 5 mg by mouth daily. 06/22/11  Yes [name], MD  PROVENTIL HFA 108 [phone] Base) MCG/ACT inhaler Inhale 2 puffs into the lungs 4 (four) times daily as needed for shortness of breath. 08/04/17  Yes [provider]  tacrolimus (PROGRAF) 1 MG capsule Take 2-3 mg by mouth See admin instructions. Take 3 mg by mouth in the morning and 2 mg at bedtime 06/22/11  Yes [name], MD  Vitamin D, Ergocalciferol, (DRISDOL) 1.25 MG (50000 UT) CAPS capsule Take 1 capsule (50,000 Units total) by mouth every 7 (seven) days. Patient taking differently: Take 50,000 Units by mouth every Sunday.  12/20/18  Yes [name], FNP  zolpidem (AMBIEN) 10 MG tablet Take 10 mg by mouth at bedtime as needed for sleep.   Yes [provider]  metolazone (ZAROXOLYN) 2.5 MG tablet Take 1 tablet (2.5 mg total) by mouth once as needed for up to 1 dose (once a week if needed for weight gain). On Monday and Friday Patient not taking: Reported on 04/17/2019 03/24/19   Domenic Polite, MD  torsemide (DEMADEX) 20 MG tablet Take 1 tablet (20 mg total) by mouth 2 (two) times daily. Patient not taking: Reported on 04/17/2019 03/24/19   Domenic Polite, MD    Allergies    Levofloxacin and Tape  Review of Systems  Review of Systems  All other systems reviewed and are negative.   Physical Exam Updated Vital Signs BP (!) 96/51    Pulse 66   Temp 98.2 F (36.8 C) (Oral)   Resp (!) 22   SpO2 100%     Physical Exam Vitals and nursing note reviewed.  Constitutional:      Appearance: She is well-developed.  HENT:     Head: Normocephalic and atraumatic.     Right Ear: External ear normal.     Left Ear: External ear normal.  Eyes:     Conjunctiva/sclera: Conjunctivae normal.     Pupils: Pupils are equal, round, and reactive to light.  Neck:     Trachea: Phonation normal.  Cardiovascular:     Rate and Rhythm: Normal rate and regular rhythm.     Heart sounds: Normal heart sounds.  Pulmonary:     Effort: Pulmonary effort is normal. No respiratory distress.     Breath sounds: No stridor. Rhonchi (Few, scattered) present. No wheezing or rales.     Comments: Normal oxygen saturation on room air. Abdominal:     Palpations: Abdomen is soft.     Tenderness: There is no abdominal tenderness.  Musculoskeletal:        General: Normal range of motion.     Cervical back: Normal range of motion and neck supple.     Right lower leg: No edema.     Left lower leg: No edema.  Skin:    General: Skin is warm and dry.  Neurological:     Mental Status: She is alert and oriented to person, place, and time.     Cranial Nerves: No cranial nerve deficit.     Sensory: No sensory deficit.     Motor: No abnormal muscle tone.     Coordination: Coordination normal.  Psychiatric:        Mood and Affect: Mood normal.        Behavior: Behavior normal.        Thought Content: Thought content normal.        Judgment: Judgment normal.     ED Results / Procedures / Treatments   Labs (all labs ordered are listed, but only abnormal results are displayed) Labs Reviewed  CBC WITH DIFFERENTIAL/PLATELET - Abnormal; Notable for the following components:      Result Value   WBC 2.5 (*)    RBC 2.47 (*)    Hemoglobin 8.3 (*)    HCT 26.6 (*)    MCV 107.7 (*)    RDW 23.9 (*)    nRBC 2.8 (*)    Lymphs Abs 0.3 (*)    Monocytes  Absolute 0.0 (*)    nRBC 3 (*)    All other components within normal limits  BASIC METABOLIC PANEL - Abnormal; Notable for the following components:   CO2 17 (*)    BUN 58 (*)    Creatinine, Ser 3.54 (*)    GFR calc non Af Amer 14 (*)    GFR calc Af Amer 16 (*)    Anion gap 16 (*)    All other components within normal limits  BRAIN NATRIURETIC PEPTIDE - Abnormal; Notable for the following components:   B Natriuretic Peptide 481.5 (*)    All other components within normal limits  URINALYSIS, ROUTINE W REFLEX MICROSCOPIC - Abnormal; Notable for the following components:   Ketones, ur 5 (*)    Protein, ur 30 (*)    All other components within  normal limits  TROPONIN I (HIGH SENSITIVITY) - Abnormal; Notable for the following components:   Troponin I (High Sensitivity) 26 (*)    All other components within normal limits  TROPONIN I (HIGH SENSITIVITY) - Abnormal; Notable for the following components:   Troponin I (High Sensitivity) 24 (*)    All other components within normal limits  RAPID URINE DRUG SCREEN, HOSP PERFORMED    EKG EKG Interpretation  Date/Time:  Monday April 17 2019 14:25:03 EDT Ventricular Rate:  112 PR Interval:  148 QRS Duration: 64 QT Interval:  444 QTC Calculation: 606 R Axis:   44 Text Interpretation: Critical Test Result: Long QTc Sinus rhythm Possible Lateral infarct , age undetermined Prolonged QT Abnormal ECG Baseline wander since last tracing no significant change except qt is longer Confirmed by Daleen Bo [phone]) on 04/17/2019 4:41:09 PM   Radiology DG Chest 2 View  Result Date: 04/17/2019 CLINICAL DATA:  Dyspnea and weakness. EXAM: CHEST - 2 VIEW COMPARISON:  March 20, 2019. FINDINGS: There are stable mild cardiomegaly and superior vena cava stent graft placement. Increased interstitial opacity projects on the bilateral infrahilar regions with mild bronchial wall thickening, but without obscuration of the diaphragm or heart borders. There is  persistent interstitial edema. There is no pneumothorax or obvious pleural effusion. Aortic knob calcified atherosclerosis and postsurgical change of lower cervical ACDF are present. Surgical clips project on the upper abdomen. IMPRESSION: Increased bilateral infrahilar bronchitis with nonspecific small airways disease which may be inflammatory, infectious or aspiration-related. No pneumonia. Stable cardiomegaly and postoperative changes with subtle pulmonary edema but no obvious pleural effusion. Electronically Signed   By: Revonda Humphrey   On: 04/17/2019 15:09    Procedures .Critical Care Performed by: Daleen Bo, MD Authorized by: Daleen Bo, MD   Critical care provider statement:    Critical care time (minutes):  35   Critical care start time:  04/17/2019 4:30 PM   Critical care end time:  04/17/2019 8:06 PM   Critical care time was exclusive of:  Separately billable procedures and treating other patients   Critical care was necessary to treat or prevent imminent or life-threatening deterioration of the following conditions:  Cardiac failure   Critical care was time spent personally by me on the following activities:  Blood draw for specimens, development of treatment plan with patient or surrogate, discussions with consultants, evaluation of patient's response to treatment, examination of patient, obtaining history from patient or surrogate, ordering and performing treatments and interventions, ordering and review of laboratory studies, pulse oximetry, re-evaluation of patient's condition, review of old charts and ordering and review of radiographic studies   (including critical care time)  Medications Ordered in ED Medications  fentaNYL (SUBLIMAZE) injection 100 mcg (100 mcg Intravenous Given 04/17/19 1813)    ED Course  I have reviewed the triage vital signs and the nursing notes.  Pertinent labs & imaging results that were available during my care of the patient were reviewed by  me and considered in my medical decision making (see chart for details).  Clinical Course as of Apr 16 2004  Mon Apr 17, 2019  1749 Per radiologist, consistent with subtle pulmonary edema.  DG Chest 2 View [EW]  1841 Normal except CO2 low, BUN high, creatinine high, GFR low, anion gap high  Basic metabolic panel(!) [EW]  4944 Normal except ketones and protein present  Urinalysis, Routine w reflex microscopic(!) [EW]  1841 Normal except white count low, hemoglobin low, MCV high  CBC with Differential(!) [  EW]  1841 Abnormal, high  Brain natriuretic peptide(!) [EW]  1843 Abnormal, mild elevation  Troponin I (High Sensitivity)(!) [EW]  1843 CBC with Differential(!) [EW]  1950 Unchanged mild elevation, delta.  Troponin I (High Sensitivity)(!) [EW]    Clinical Course User Index [EW] Daleen Bo, MD   MDM Rules/Calculators/A&P                       Patient Vitals for the past 24 hrs:  BP Temp Temp src Pulse Resp SpO2  04/17/19 1930 (!) 96/51 -- -- -- (!) 22 --  04/17/19 1915 -- -- -- 66 13 100 %  04/17/19 1900 (!) 97/53 -- -- 65 14 100 %  04/17/19 1845 -- -- -- 71 18 100 %  04/17/19 1830 108/61 -- -- 72 15 100 %  04/17/19 1815 -- -- -- 77 17 98 %  04/17/19 1800 117/69 -- -- 79 20 100 %  04/17/19 1745 -- -- -- 70 -- 99 %  04/17/19 1730 124/64 -- -- 71 -- 100 %  04/17/19 1715 -- -- -- 69 -- 100 %  04/17/19 1700 117/60 -- -- 65 -- 100 %  04/17/19 1645 -- -- -- 63 -- 100 %  04/17/19 1637 -- -- -- 76 -- 98 %  04/17/19 1635 135/78 -- -- (!) 51 -- 93 %  04/17/19 1429 136/71 98.2 F (36.8 C) Oral 68 16 100 %    7:53 PM Reevaluation with update and discussion. After initial assessment and treatment, an updated evaluation reveals she remains alert and comfortable.  Findings discussed with the patient and all questions were answered. Daleen Bo   Medical Decision Making: Nonspecific symptoms, with reported vomiting and diarrhea over the last 2 days.  The GI problem has resolved  spontaneously.  Labs are roughly at baseline.  Iron deficiency anemia, with elevated MCV, stable, patient was treated with Procrit injection, on 04/10/2019.  Leukopenia, is chronic.  Essentially unchanged GFR/creatinine/BUN.  BNP is mildly higher than prior.  Chest x-ray consistent with very minimal pulmonary edema.  Mildly elevated initial high-sensitivity troponin.  Delta troponin ordered.  It is normal.  Incidental prolonged QT on EKG.  Patient does not have any signs or symptoms of cardiac arrhythmia or syncope.  She is hemodynamically stable.  CRITICAL CARE- yes Performed by: Daleen Bo   Nursing Notes Reviewed/ Care Coordinated Applicable Imaging Reviewed Interpretation of Laboratory Data incorporated into ED treatment  The patient appears reasonably screened and/or stabilized for discharge and I doubt any other medical condition or other [facility] requiring further screening, evaluation, or treatment in the ED at this time prior to discharge.  Plan: Home Medications-continue current; Home Treatments-usual diet and activity; return here if the recommended treatment, does not improve the symptoms; Recommended follow up-follow-up in CHF clinic, in 2 days, as scheduled    Final Clinical Impression(s) / ED Diagnoses Final diagnoses:  Chronic congestive heart failure, unspecified heart failure type ([place])  Prolonged Q-T interval on ECG    Rx / DC Orders ED Discharge Orders    None       Daleen Bo, MD 04/17/19 2007

## 2019-04-19 ENCOUNTER — Other Ambulatory Visit: Payer: Self-pay

## 2019-04-19 ENCOUNTER — Ambulatory Visit (HOSPITAL_COMMUNITY)
Admission: RE | Admit: 2019-04-19 | Discharge: 2019-04-19 | Disposition: A | Discharge status: Home / Self Care | Payer: Medicaid Other | Source: Ambulatory Visit | Attending: Internal Medicine | Admitting: Internal Medicine

## 2019-04-19 ENCOUNTER — Encounter (HOSPITAL_COMMUNITY): Payer: Self-pay | Admitting: Internal Medicine

## 2019-04-19 VITALS — BP 90/50 | HR 74 | Wt 119.6 lb

## 2019-04-19 DIAGNOSIS — I5032 Chronic diastolic (congestive) heart failure: Secondary | ICD-10-CM | POA: Diagnosis not present

## 2019-04-19 DIAGNOSIS — M109 Gout, unspecified: Secondary | ICD-10-CM | POA: Insufficient documentation

## 2019-04-19 DIAGNOSIS — Z7982 Long term (current) use of aspirin: Secondary | ICD-10-CM | POA: Diagnosis not present

## 2019-04-19 DIAGNOSIS — K219 Gastro-esophageal reflux disease without esophagitis: Secondary | ICD-10-CM | POA: Diagnosis not present

## 2019-04-19 DIAGNOSIS — Z820 Family history of epilepsy and other diseases of the nervous system: Secondary | ICD-10-CM | POA: Insufficient documentation

## 2019-04-19 DIAGNOSIS — H5461 Unqualified visual loss, right eye, normal vision left eye: Secondary | ICD-10-CM | POA: Diagnosis not present

## 2019-04-19 DIAGNOSIS — G629 Polyneuropathy, unspecified: Secondary | ICD-10-CM | POA: Diagnosis not present

## 2019-04-19 DIAGNOSIS — Z881 Allergy status to other antibiotic agents status: Secondary | ICD-10-CM | POA: Diagnosis not present

## 2019-04-19 DIAGNOSIS — Z79899 Other long term (current) drug therapy: Secondary | ICD-10-CM | POA: Insufficient documentation

## 2019-04-19 DIAGNOSIS — I10 Essential (primary) hypertension: Secondary | ICD-10-CM

## 2019-04-19 DIAGNOSIS — Z940 Kidney transplant status: Secondary | ICD-10-CM | POA: Diagnosis not present

## 2019-04-19 DIAGNOSIS — E559 Vitamin D deficiency, unspecified: Secondary | ICD-10-CM | POA: Insufficient documentation

## 2019-04-19 DIAGNOSIS — I130 Hypertensive heart and chronic kidney disease with heart failure and stage 1 through stage 4 chronic kidney disease, or unspecified chronic kidney disease: Secondary | ICD-10-CM | POA: Insufficient documentation

## 2019-04-19 DIAGNOSIS — Z8673 Personal history of transient ischemic attack (TIA), and cerebral infarction without residual deficits: Secondary | ICD-10-CM | POA: Insufficient documentation

## 2019-04-19 DIAGNOSIS — I509 Heart failure, unspecified: Secondary | ICD-10-CM | POA: Diagnosis present

## 2019-04-19 DIAGNOSIS — F17210 Nicotine dependence, cigarettes, uncomplicated: Secondary | ICD-10-CM | POA: Insufficient documentation

## 2019-04-19 DIAGNOSIS — Z800 Family history of malignant neoplasm of digestive organs: Secondary | ICD-10-CM | POA: Diagnosis not present

## 2019-04-19 DIAGNOSIS — M797 Fibromyalgia: Secondary | ICD-10-CM | POA: Insufficient documentation

## 2019-04-19 DIAGNOSIS — Z7952 Long term (current) use of systemic steroids: Secondary | ICD-10-CM | POA: Diagnosis not present

## 2019-04-19 DIAGNOSIS — K3184 Gastroparesis: Secondary | ICD-10-CM | POA: Insufficient documentation

## 2019-04-19 DIAGNOSIS — Z8711 Personal history of peptic ulcer disease: Secondary | ICD-10-CM | POA: Diagnosis not present

## 2019-04-19 DIAGNOSIS — N184 Chronic kidney disease, stage 4 (severe): Secondary | ICD-10-CM | POA: Insufficient documentation

## 2019-04-19 MED ORDER — FUROSEMIDE 80 MG PO TABS: 80.0000 mg | ORAL_TABLET | Freq: Every day | ORAL | 5 refills | Status: DC

## 2019-04-19 NOTE — Patient Instructions (Signed)
INCREASE Lasix to 80mg  (1 tab) daily  Your physician recommends that you schedule a follow-up appointment in: 4 months with Bensim[facility]. We will call you to schedule this appointment.   Please call office at [phone] option 2 if you have any questions or concerns.   At the [facility], you and your health needs are our priority. As part of our continuing mission to provide you with exceptional heart care, we have created designated Provider Care Teams. These Care Teams include your primary Cardiologist (physician) and Advanced Practice Providers (APPs- Physician Assistants and Nurse Practitioners) who all work together to provide you with the care you need, when you need it.   You may see any of the following providers on your designated Care Team at your next follow up: [name] Dr Glori Bickers . Dr Loralie Champagne . Darrick Grinder, NP . Lyda Jester, PA . Audry Riles, PharmD   Please be sure to bring in all your medications bottles to every appointment.

## 2019-04-19 NOTE — Progress Notes (Signed)
[facility]  NOTE  Referring Physician: [place] Primary Cardiologist: O'Neal   HPI:  Tina Watkins is a 54 y.o. female with a hx of gout, gastroparesis, CVA, ESRD status post renal transplant, heart failure with preserved ejection fraction, hypertension who is being seen today for the evaluation of congestive heart failure at the request of Dr. Marval Regal.   In December gained a lot of fluid (~30 pounds) over the holidays and was referred to Dr. Audie Box. He recommended IV diuresis but she did not want to be admitted. Saw Dr. Marval Regal who switched to torsemide. She does not recall dose but says she is taking 1 in the morning and 1 at night. (likely 20 bid). No real effect. Weight down 3 pounds 130 -> 127. (baseline 112).   She was admitted to the hospital 2/15-19/21 for volume overload. Did very well with IV diuresis. Weigh down to 119 pounds. She did bump creatinine to 3.6 so diuretics held for a short time. RHC attempted but could not complete due to  Access challenges and inability to get into the PA. RA pressure was 3 and cardiac output was normal. There was evidence of multiple venous narrowings in the IVC.   Here for f/u. Continues to struggle with fluid overload. Says she is doing much better watching her fluid intake but says she is drinking at least 4 bottles of water per day. Over the weekend had gastroparesis flare. Gastroparesis getting better. But she says she is dehydrated. Creatinine running 3.6-3.7 recently. Mild SOB but says inhaler has helped. Still smoking a few cigarettes but trying to cut back. Was on torsemide but now on furosemide 40 daily (said torsemide gave her HAs)  REDs today 46%    Cardiac studies:   1. Left ventricular ejection fraction, by visual estimation, is 60 to  65%. The left ventricle has normal function. There is moderately increased  left ventricular hypertrophy.  2. Left ventricular diastolic parameters are consistent with  Grade III  diastolic dysfunction (restrictive).  3. Elevated left ventricular end-diastolic pressure.  4. Global right ventricle has normal systolic function.The right  ventricular size is normal. No increase in right ventricular wall  thickness.  5. Moderately elevated pulmonary artery systolic pressure.  6. The tricuspid regurgitant velocity is 3.22 m/s, and with an assumed  right atrial pressure of 3 mmHg, the estimated right ventricular systolic  pressure is moderately elevated at 44.6 mmHg.  7. The mitral valve is normal in structure. Trivial mitral valve  regurgitation. No evidence of mitral stenosis.  8. The tricuspid valve is normal in structure. Moderate TR.  9. The aortic valve has an indeterminant number of cusps. Aortic valve  regurgitation is not visualized. Mild aortic valve sclerosis without  stenosis.    Past Medical History:  Diagnosis Date  . Bacteremia due to Gram-negative bacteria 05/23/2011  . Blind    right eye  . Blind right eye   . CHF (congestive heart failure) ([place])   . Chronic lower back pain   . Complication of anesthesia    "woke up during OR; I have an extremely high tolerance" (12/11/2011) 1 procedure was graft; the other procedures were procedures that are typically done with sedation.  . DDD (degenerative disc disease), cervical   . Depression   . Dysrhythmia    "tachycardia" (12/11/2011) new onset afib 10/15/14 EKG  . E coli bacteremia 06/18/2011  . Elevated LDL cholesterol level 12/2018  . ESRD (end stage renal disease) ([place]) 06/12/2011  .  Fibromyalgia   . Gastroparesis   . Gastroparesis   . GERD (gastroesophageal reflux disease)   . Glaucoma    right eye  . Gout   . H/O carpal tunnel syndrome   . Headache(784.0)    "not often anymore" (12/11/2011)  . Herpes genitalia 1994  . History of blood transfusion    "more than a few times" (12/11/2011)  . History of stomach ulcers   . Hypotension   . Iron deficiency anemia   . New onset a-fib  ([place])    10/15/14 EKG  . Osteopenia   . Peripheral neuropathy 11/2018  . Seizures ([place]) 1994   "post transplant; only have had that one" (12/11/2011)  . Spinal stenosis in cervical region   . Stroke [facility])     left basal ganglia lacunar infarct; Right frontal lobe lacunar infarct.  . Stroke [facility]) ~ 1999; 2001   "briefly lost my vision; lost my right eye" (12/11/2011)  . Vitamin D deficiency 12/2018    Current Outpatient Medications  Medication Sig Dispense Refill  . aspirin EC 81 MG tablet Take 81 mg by mouth daily.     [name] azaTHIOprine (IMURAN) 50 MG tablet Take 100 mg by mouth daily.     . colchicine 0.6 MG tablet Take 0.6-1.2 mg by mouth See admin instructions. Take 1.2 mg by mouth at onset, then take 0.6 mg three times a day as need for gout flare    . Darbepoetin Alfa (ARANESP, ALBUMIN FREE, IJ) Inject as directed every 14 (fourteen) days.    . fluticasone (FLONASE) 50 MCG/ACT nasal spray Place 1 spray into both nostrils daily as needed for allergies or rhinitis.    . furosemide (LASIX) 40 MG tablet Take 40 mg by mouth daily.     [name] gabapentin (NEURONTIN) 100 MG capsule Take 100 mg by mouth 2 (two) times daily.     [name] latanoprost (XALATAN) 0.005 % ophthalmic solution Place 1 drop into the left eye at bedtime.  3  . lidocaine (LIDODERM) 5 % Place 1 patch onto the skin daily as needed (for pain- remove old patch first).     [name] metoCLOPramide (REGLAN) 5 MG tablet Take 1 tablet (5 mg total) by mouth 2 (two) times daily. 180 tablet 3  . metolazone (ZAROXOLYN) 2.5 MG tablet Take 1 tablet (2.5 mg total) by mouth once as needed for up to 1 dose (once a week if needed for weight gain). On Monday and Friday    . nystatin (MYCOSTATIN) 100000 UNIT/ML suspension Use as directed 5 mLs in the mouth or throat daily as needed (FOR THRUSH).     [name] omeprazole (PRILOSEC) 40 MG capsule Take 40 mg by mouth 2 (two) times daily.    . ondansetron (ZOFRAN-ODT) 4 MG disintegrating tablet Take 4 mg by mouth every 8  (eight) hours as needed for nausea or vomiting.    [name] oxyCODONE-acetaminophen (PERCOCET) 10-325 MG tablet Take 1 tablet by mouth See admin instructions. Take 1 tablet by mouth every four to six hours as needed for pain    . predniSONE (DELTASONE) 5 MG tablet Take 5 mg by mouth daily.    [name] PROVENTIL HFA 108 (90 Base) MCG/ACT inhaler Inhale 2 puffs into the lungs 4 (four) times daily as needed for shortness of breath.  2  . tacrolimus (PROGRAF) 1 MG capsule Take 2-3 mg by mouth See admin instructions. Take 3 mg by mouth in the morning and 2 mg at bedtime    . Vitamin  D, Ergocalciferol, (DRISDOL) 1.25 MG (50000 UNIT) CAPS capsule Take 50,000 Units by mouth every 7 (seven) days. On sundays    . zolpidem (AMBIEN) 10 MG tablet Take 10 mg by mouth at bedtime as needed for sleep.     No current facility-administered medications for this encounter.    Allergies  Allergen Reactions  . Levofloxacin Itching and Rash  . Tape Other (See Comments)    "Certain surgical tapes peel off my skin"      Social History   Socioeconomic History  . Marital status: Single    Spouse name: Not on file  . Number of children: 0  . Years of education: some college  . Highest education level: Not on file  Occupational History  . Occupation: disabled    Fish farm manager: UNEMPLOYED  Tobacco Use  . Smoking status: Current Every Day Smoker    Packs/day: 0.20    Years: 28.00    Pack years: 5.60    Types: Cigarettes  . Smokeless tobacco: Never Used  Substance and Sexual Activity  . Alcohol use: Yes    Comment: rare  . Drug use: Yes    Types: Marijuana    Comment: occasional use  . Sexual activity: Yes  Other Topics Concern  . Not on file  Social History Narrative   Right-handed.   Four cups caffeine per day.   Lives alone.   Social Determinants of Health   Financial Resource Strain:   . Difficulty of Paying Living Expenses:   Food Insecurity:   . Worried About Charity fundraiser in the Last Year:   . Arts development officer in the Last Year:   Transportation Needs:   . Film/video editor (Medical):   [name] Lack of Transportation (Non-Medical):   Physical Activity:   . Days of Exercise per Week:   . Minutes of Exercise per Session:   Stress:   . Feeling of Stress :   Social Connections:   . Frequency of Communication with Friends and Family:   . Frequency of Social Gatherings with Friends and Family:   . Attends Religious Services:   . Active Member of Clubs or Organizations:   . Attends Archivist Meetings:   [name] Marital Status:   Intimate Partner Violence:   . Fear of Current or Ex-Partner:   . Emotionally Abused:   [name] Physically Abused:   . Sexually Abused:       Family History  Problem Relation Age of Onset  . Glaucoma Mother   . Pancreatic cancer Father   . Multiple sclerosis Brother   . Hypertension Maternal Grandmother   . Breast cancer Neg Hx     Vitals:   04/19/19 1103  BP: (!) 90/50  Pulse: 74  SpO2: 100%  Weight: 54.3 kg (119 lb 9.6 oz)   PHYSICAL EXAM: General:  Sitting in chair No resp difficulty HEENT: normal R eye opacified HEENT: normal Neck: supple.+ JVD to jaw. Carotids 2+ bilat; no bruits. No lymphadenopathy or thryomegaly appreciated. Cor: PMI nondisplaced. Regular rate & rhythm. No rubs, gallops or murmurs. Lungs: clear with bronchial breath sounds throughout  Abdomen: soft, nontender, + distended. No hepatosplenomegaly. No bruits or masses. Good bowel sounds. Extremities: no cyanosis, clubbing, rash, 2-3+ edema into thich Neuro: alert & orientedx3, cranial nerves grossly intact. moves all 4 extremities w/o difficulty. Affect pleasant   ASSESSMENT & PLAN:  1. Chronic diatolic heart failure with preserved ejection fraction ([place]) - Echo 12/20 EF 60% RV  normal. Grade 3 DD - admitted in 2/21 for IV diuresis weight down to 119. RHC with RA 3 and normal cardiac output. Unable to get into the PA.  - Weight stable but fluid is up and this is  complicated by worsening renal function  - Was on torsemide but now on furosemide 40 (said torsemide gave her HAs). Will increase lasix to 80 daily. Will need to follow renal function closely with Dr. Myrtie Hawk. I uspect she is heading back toward HD - CI again stressed the ned for dietary restriction which I think is a big challenge here  2. Essential hypertension - Blood pressure low. May need midodrine in near future   3. Tobacco use - smoking several cigarettes per day. encouraged her to quit  4. CKD 4 - s/p previous renal transplant. - followed by Dr. Marval Regal as above - creatinine getting worse now 3.6 - repeat labs today    Glori Bickers, MD  11:34 AM

## 2019-04-20 DIAGNOSIS — L820 Inflamed seborrheic keratosis: Secondary | ICD-10-CM | POA: Diagnosis not present

## 2019-04-20 DIAGNOSIS — R21 Rash and other nonspecific skin eruption: Secondary | ICD-10-CM | POA: Diagnosis not present

## 2019-04-21 ENCOUNTER — Encounter (HOSPITAL_COMMUNITY): Payer: Medicaid Other

## 2019-04-24 ENCOUNTER — Encounter (HOSPITAL_COMMUNITY): Payer: Medicaid Other

## 2019-04-27 DIAGNOSIS — H52202 Unspecified astigmatism, left eye: Secondary | ICD-10-CM | POA: Diagnosis not present

## 2019-04-28 ENCOUNTER — Other Ambulatory Visit: Payer: Self-pay

## 2019-04-28 ENCOUNTER — Encounter (HOSPITAL_COMMUNITY)
Admission: RE | Admit: 2019-04-28 | Discharge: 2019-04-28 | Disposition: A | Discharge status: Home / Self Care | Payer: Medicaid Other | Source: Ambulatory Visit | Attending: Nephrology | Admitting: Nephrology

## 2019-04-28 VITALS — BP 82/46 | HR 68 | Temp 96.6°F | Resp 18

## 2019-04-28 DIAGNOSIS — D631 Anemia in chronic kidney disease: Secondary | ICD-10-CM

## 2019-04-28 DIAGNOSIS — E7800 Pure hypercholesterolemia, unspecified: Secondary | ICD-10-CM | POA: Diagnosis not present

## 2019-04-28 DIAGNOSIS — N185 Chronic kidney disease, stage 5: Secondary | ICD-10-CM

## 2019-04-28 DIAGNOSIS — Z940 Kidney transplant status: Secondary | ICD-10-CM

## 2019-04-28 DIAGNOSIS — Z981 Arthrodesis status: Secondary | ICD-10-CM | POA: Diagnosis not present

## 2019-04-28 DIAGNOSIS — Z79899 Other long term (current) drug therapy: Secondary | ICD-10-CM | POA: Diagnosis not present

## 2019-04-28 LAB — POCT HEMOGLOBIN-HEMACUE: Hemoglobin: 7.1 g/dL — ABNORMAL LOW (ref 12.0–15.0)

## 2019-04-28 MED ORDER — EPOETIN ALFA 10000 UNIT/ML IJ SOLN
INTRAMUSCULAR | Status: AC
  Administered 2019-04-28: 50000 [IU] via SUBCUTANEOUS
  Filled 2019-04-28: qty 1

## 2019-04-28 MED ORDER — EPOETIN ALFA 40000 UNIT/ML IJ SOLN: 40000.0000 [IU] | INTRAMUSCULAR | Status: DC

## 2019-04-28 MED ORDER — EPOETIN ALFA 40000 UNIT/ML IJ SOLN
INTRAMUSCULAR | Status: AC
  Filled 2019-04-28: qty 1

## 2019-04-28 MED ORDER — EPOETIN ALFA 10000 UNIT/ML IJ SOLN: 50000.0000 [IU] | Freq: Once | INTRAMUSCULAR | Status: AC

## 2019-05-01 MED FILL — Epoetin Alfa-epbx Inj 40000 Unit/ML: INTRAMUSCULAR | Qty: 1 | Status: AC

## 2019-05-01 MED FILL — Epoetin Alfa Inj 10000 Unit/ML: INTRAMUSCULAR | Qty: 1 | Status: AC

## 2019-05-05 ENCOUNTER — Ambulatory Visit: Payer: Medicaid Other | Admitting: Family Medicine

## 2019-05-07 NOTE — Progress Notes (Signed)
Cardiology Office Note:   Date:  05/08/2019  NAME:  Tina Watkins    MRN: [id number] DOB:  [birth year]   PCP:  [name], FNP  Cardiologist:  Evalina Field, MD   Referring MD: [name], FNP   Chief Complaint  Patient presents with  . Congestive Heart Failure   History of Present Illness:   Tina Watkins is a 54 y.o. female with a hx of ESRD s/p renal transplant c/b CKD in transplant, HTN, TIA who presents for follow-up of HFpEF. She has established in the heart failure clinic. She is now CKD 4 in her transplant. Admitted 2/15-2/19 for volume overload and IV diuretics.  She reports since her hospitalization she is had no energy.  She reports constant fatigue.  She also reports achiness in most of her joints.  She appears to have had a chronic cough.  Her volume status is much improved today she just has no energy.  She is sticking to the heart failure recommendation of 32 ounces of water daily.  She does take Lasix 80 mg daily with metolazone on Mondays and Fridays as needed for increased weight gain.  Her weight seem to be stable from what she reports.  She reports no fevers.  She did have a gastroparesis flare recently and was seen in the hospital.  She reports that appetites been okay but she is struggling with the limitations of fluid intake.  No sick contacts in the house.  She denies chest pain, shortness of breath, palpitations today.  Symptoms mainly appear to be fatigue.  I am worried about her worsening kidney function and possibly worsening uremia.  Problem List 1. 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 HFpEF 60-65% Grade 3DD 3. TIA 4. HLD 5. HLD 6. Tobacco abuse 7. HTN  Past Medical History: Past Medical History:  Diagnosis Date  . Bacteremia due to Gram-negative bacteria 05/23/2011  . Blind    right eye  . Blind right eye   . CHF (congestive heart failure) ([place])   .  Chronic lower back pain   . Complication of anesthesia    "woke up during OR; I have an extremely high tolerance" (12/11/2011) 1 procedure was graft; the other procedures were procedures that are typically done with sedation.  . DDD (degenerative disc disease), cervical   . Depression   . Dysrhythmia    "tachycardia" (12/11/2011) new onset afib 10/15/14 EKG  . E coli bacteremia 06/18/2011  . Elevated LDL cholesterol level 12/2018  . ESRD (end stage renal disease) ([place]) 06/12/2011  . Fibromyalgia   . Gastroparesis   . Gastroparesis   . GERD (gastroesophageal reflux disease)   . Glaucoma    right eye  . Gout   . H/O carpal tunnel syndrome   . Headache(784.0)    "not often anymore" (12/11/2011)  . Herpes genitalia 1994  . History of blood transfusion    "more than a few times" (12/11/2011)  . History of stomach ulcers   . Hypotension   . Iron deficiency anemia   . New onset a-fib ([place])    10/15/14 EKG  . Osteopenia   . Peripheral neuropathy 11/2018  . Seizures ([place]) 1994   "post transplant; only have had that one" (12/11/2011)  . Spinal stenosis in cervical region   . Stroke [facility])     left basal ganglia lacunar infarct; Right frontal lobe lacunar infarct.  . Stroke ([place]) ~ 1999;  2001   "briefly lost my vision; lost my right eye" (12/11/2011)  . Vitamin D deficiency 12/2018    Past Surgical History: Past Surgical History:  Procedure Laterality Date  . ANTERIOR CERVICAL DECOMP/DISCECTOMY FUSION N/A 01/08/2015   Procedure: Anterior Cervical Three-Four/Four-Five Decompression/Diskectomy/Fusion;  Surgeon: Leeroy Cha, MD;  Location: [place] NEURO ORS;  Service: Neurosurgery;  Laterality: N/A;  C3-4 C4-5 Anterior cervical decompression/diskectomy/fusion  . APPENDECTOMY  ~ 2004  . CATARACT EXTRACTION     right eye  . COLONOSCOPY    . ENUCLEATION  2001   "right"  . ESOPHAGOGASTRODUODENOSCOPY (EGD) WITH PROPOFOL N/A 04/21/2012   Procedure: ESOPHAGOGASTRODUODENOSCOPY (EGD) WITH PROPOFOL;   Surgeon: Milus Banister, MD;  Location: [facility] ENDOSCOPY;  Service: Endoscopy;  Laterality: N/A;  . 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 [place]; 1999; 2005   "right"  . MULTIPLE TOOTH EXTRACTIONS    . RIGHT HEART CATH N/A 03/23/2019   Procedure: RIGHT HEART CATH;  Surgeon: Jolaine Artist, MD;  Location: [place] CV LAB;  Service: Cardiovascular;  Laterality: N/A;  . TONSILLECTOMY    . TOTAL NEPHRECTOMY  1988?; 1994; 2005    Current Medications: Current Meds  Medication Sig  . aspirin EC 81 MG tablet Take 81 mg by mouth daily.   [name] azaTHIOprine (IMURAN) 50 MG tablet Take 100 mg by mouth daily.   . colchicine 0.6 MG tablet Take 0.6-1.2 mg by mouth See admin instructions. Take 1.2 mg by mouth at onset, then take 0.6 mg three times a day as need for gout flare  . Darbepoetin Alfa (ARANESP, ALBUMIN FREE, IJ) Inject as directed every 14 (fourteen) days.  . fluticasone (FLONASE) 50 MCG/ACT nasal spray Place 1 spray into both nostrils daily as needed for allergies or rhinitis.  . furosemide (LASIX) 80 MG tablet Take 1 tablet (80 mg total) by mouth daily.  [name] gabapentin (NEURONTIN) 100 MG capsule Take 100 mg by mouth 2 (two) times daily.   [name] latanoprost (XALATAN) 0.005 % ophthalmic solution Place 1 drop into the left eye at bedtime.  . lidocaine (LIDODERM) 5 % Place 1 patch onto the skin daily as needed (for pain- remove old patch first).   [name] metoCLOPramide (REGLAN) 5 MG tablet Take 1 tablet (5 mg total) by mouth 2 (two) times daily.  . metolazone (ZAROXOLYN) 2.5 MG tablet Take 1 tablet (2.5 mg total) by mouth once as needed for up to 1 dose (once a week if needed for weight gain). On Monday and Friday  . nystatin (MYCOSTATIN) 100000 UNIT/ML suspension Use as directed 5 mLs in the mouth or throat daily as needed (FOR THRUSH).   [name] omeprazole (PRILOSEC) 40 MG capsule Take 40 mg by mouth 2 (two) times daily.  .  ondansetron (ZOFRAN-ODT) 4 MG disintegrating tablet Take 4 mg by mouth every 8 (eight) hours as needed for nausea or vomiting.  [name] oxyCODONE-acetaminophen (PERCOCET) 10-325 MG tablet Take 1 tablet by mouth See admin instructions. Take 1 tablet by mouth every four to six hours as needed for pain  . predniSONE (DELTASONE) 5 MG tablet Take 5 mg by mouth daily.  [name] PROVENTIL HFA 108 (90 Base) MCG/ACT inhaler Inhale 2 puffs into the lungs 4 (four) times daily as needed for shortness of breath.  . tacrolimus (PROGRAF) 1 MG capsule Take 2-3 mg by mouth See admin instructions. Take 3 mg by mouth in the  morning and 2 mg at bedtime  . Vitamin D, Ergocalciferol, (DRISDOL) 1.25 MG (50000 UNIT) CAPS capsule Take 50,000 Units by mouth every 7 (seven) days. On sundays  . zolpidem (AMBIEN) 10 MG tablet Take 10 mg by mouth at bedtime as needed for sleep.     Allergies:    Levofloxacin and Tape   Social History: Social History   Socioeconomic History  . Marital status: Single    Spouse name: Not on file  . Number of children: 0  . Years of education: some college  . Highest education level: Not on file  Occupational History  . Occupation: disabled    Fish farm manager: UNEMPLOYED  Tobacco Use  . Smoking status: Current Every Day Smoker    Packs/day: 0.20    Years: 28.00    Pack years: 5.60    Types: Cigarettes  . Smokeless tobacco: Never Used  Substance and Sexual Activity  . Alcohol use: Yes    Comment: rare  . Drug use: Yes    Types: Marijuana    Comment: occasional use  . Sexual activity: Yes  Other Topics Concern  . Not on file  Social History Narrative   Right-handed.   Four cups caffeine per day.   Lives alone.   Social Determinants of Health   Financial Resource Strain:   . Difficulty of Paying Living Expenses:   Food Insecurity:   . Worried About Charity fundraiser in the Last Year:   . Arboriculturist in the Last Year:   Transportation Needs:   . Film/video editor (Medical):     [name] Lack of Transportation (Non-Medical):   Physical Activity:   . Days of Exercise per Week:   . Minutes of Exercise per Session:   Stress:   . Feeling of Stress :   Social Connections:   . Frequency of Communication with Friends and Family:   . Frequency of Social Gatherings with Friends and Family:   . Attends Religious Services:   . Active Member of Clubs or Organizations:   . Attends Archivist Meetings:   [name] Marital Status:      Family History: The patient's family history includes Glaucoma in her mother; Hypertension in her maternal grandmother; Multiple sclerosis in her brother; Pancreatic cancer in her father. There is no history of Breast cancer.  ROS:   All other ROS reviewed and negative. Pertinent positives noted in the HPI.     EKGs/Labs/Other Studies Reviewed:   The following studies were personally reviewed by me today:  EKG:  EKG is ordered today.  The ekg ordered today demonstrates normal sinus rhythm, heart rate 66, Q waves noted in inferolateral leads, and was personally reviewed by me.   Recent Labs: 12/06/2018: TSH 0.878 04/10/2019: ALT 14 04/17/2019: B Natriuretic Peptide 481.5; BUN 58; Creatinine, Ser 3.54; Platelets 156; Potassium 4.8; Sodium 139 04/28/2019: Hemoglobin 7.1   Recent Lipid Panel    Component Value Date/Time   CHOL 214 (H) 12/06/2018 1052   TRIG 118 12/06/2018 1052   HDL 89 12/06/2018 1052   CHOLHDL 2.4 12/06/2018 1052   CHOLHDL 2.9 08/23/2008 0604   VLDL 21 08/23/2008 0604   LDLCALC 105 (H) 12/06/2018 1052    Physical Exam:   VS:  BP 98/68   Pulse 66   Temp (!) 97 F (36.1 C)   Ht 4\' 11"  (1.499 m)   Wt 115 lb 3.2 oz (52.3 kg)   SpO2 99%   BMI 23.27 kg/m  Wt Readings from Last 3 Encounters:  05/08/19 115 lb 3.2 oz (52.3 kg)  04/19/19 119 lb 9.6 oz (54.3 kg)  03/24/19 121 lb 6.4 oz (55.1 kg)    General: No acute distress Heart: Atraumatic, normal size  Eyes: PEERLA, EOMI  Neck: Supple, JVD noted 7-8 centimeters  of water Endocrine: No thryomegaly Cardiac: 3 out of 6 holosystolic murmur Lungs: Clear to auscultation bilaterally, no wheezing, rhonchi or rales  Abd: Soft, nontender, no hepatomegaly  Ext: 1+ lower extremity edema Musculoskeletal: No deformities, BUE and BLE strength normal and equal Skin: Warm and dry, no rashes   Neuro: Alert and oriented to person, place, time, and situation, CNII-XII grossly intact, no focal deficits  Psych: Normal mood and affect   ASSESSMENT:   Tina Watkins is a 54 y.o. female who presents for the following: 1. Fatigue, unspecified type   2. Chronic diastolic heart failure ([place])   3. Essential hypertension   4. CKD (chronic kidney disease) stage 4, GFR 15-29 ml/min (HCC)     PLAN:   1. Fatigue, unspecified type 2. Chronic diastolic heart failure ([place]) -She presents for follow-up.  Volume status much improved since her last visit.  She is adhering to the strict fluid restrictions put in place by heart failure.  32 ounces of water daily.  I informed her she can increase this on some days.  Especially when she has gastroparesis failures.  She reports that she has had no energy and quite fatigued.  I am a little concerned that her kidney function is worsening.  We will check a BMP as well as CBC today.  She will continue with her Lasix 80 mg daily as well as metolazone on Mondays and Fridays.  Her blood pressure is acceptable today.  She is on no blood pressure medication. -Lungs are without crackles.  She does report a chronic cough.  She reports no fevers or chills.  I did inform her should she continue to decline she should be evaluated the emergency room.  She reports mainly fatigue and decreased energy are her main issue.  She clearly is high risk for infection given her kidney transplant.  We will see what her blood work looks like today and I will let her know tomorrow.  She will be seen by her kidney doctor on Wednesday.  3. Essential  hypertension -On no blood pressure medications.  BP acceptable.  Continue diuretics as above.  4. CKD (chronic kidney disease) stage 4, GFR 15-29 ml/min (HCC) -CKD stage IV and transplanted kidney.  Recheck labs today.  I am concerned that her fatigue is due to worsening kidney function and possible uremia.  We will let her know what her lab results are tomorrow.  Disposition: Return if symptoms worsen or fail to improve.  Medication Adjustments/Labs and Tests Ordered: Current medicines are reviewed at length with the patient today.  Concerns regarding medicines are outlined above.  Orders Placed This Encounter  Procedures  . CBC  . Basic metabolic panel  . EKG 12-Lead   No orders of the defined types were placed in this encounter.   Patient Instructions  Medication Instructions:  The current medical regimen is effective;  continue present plan and medications.  *If you need a refill on your cardiac medications before your next appointment, please call your pharmacy*   Lab Work: CBC, BMET today  If you have labs (blood work) drawn today and your tests are completely normal, you will receive your results only by: [name]  MyChart Message (if you have MyChart) OR . A paper copy in the mail If you have any lab test that is abnormal or we need to change your treatment, we will call you to review the results.   Follow-Up: At [facility], you and your health needs are our priority.  As part of our continuing mission to provide you with exceptional heart care, we have created designated Provider Care Teams.  These Care Teams include your primary Cardiologist (physician) and Advanced Practice Providers (APPs -  Physician Assistants and Nurse Practitioners) who all work together to provide you with the care you need, when you need it.  We recommend signing up for the patient portal called "MyChart".  Sign up information is provided on this After Visit Summary.  MyChart is used to connect  with patients for Virtual Visits (Telemedicine).  Patients are able to view lab/test results, encounter notes, upcoming appointments, etc.  Non-urgent messages can be sent to your provider as well.   To learn more about what you can do with MyChart, go to [email or web address].    Your next appointment:   To follow up with heart failure clinic      Time Spent with Patient: I have spent a total of 25 minutes with patient reviewing hospital notes, telemetry, EKGs, labs and examining the patient as well as establishing an assessment and plan that was discussed with the patient.  > 50% of time was spent in direct patient care.  Signed, Addison Naegeli. Audie Box, [place]  [address] [place], [place] [address] [phone]  05/08/2019 10:19 AM

## 2019-05-08 ENCOUNTER — Telehealth: Payer: Self-pay | Admitting: Cardiovascular Disease

## 2019-05-08 ENCOUNTER — Emergency Department (HOSPITAL_COMMUNITY): Payer: Medicaid Other

## 2019-05-08 ENCOUNTER — Encounter: Payer: Self-pay | Admitting: Cardiovascular Disease

## 2019-05-08 ENCOUNTER — Encounter: Payer: Self-pay | Admitting: Family Medicine

## 2019-05-08 ENCOUNTER — Ambulatory Visit (INDEPENDENT_AMBULATORY_CARE_PROVIDER_SITE_OTHER): Payer: Medicaid Other | Admitting: Family Medicine

## 2019-05-08 ENCOUNTER — Ambulatory Visit (INDEPENDENT_AMBULATORY_CARE_PROVIDER_SITE_OTHER): Payer: Medicaid Other | Admitting: Cardiovascular Disease

## 2019-05-08 ENCOUNTER — Other Ambulatory Visit: Payer: Self-pay

## 2019-05-08 ENCOUNTER — Inpatient Hospital Stay (HOSPITAL_COMMUNITY)
Admission: EM | Admit: 2019-05-08 | Discharge: 2019-05-18 | DRG: 177 | Disposition: A | Discharge status: Home Health | Payer: Medicaid Other | Attending: Internal Medicine | Admitting: Internal Medicine

## 2019-05-08 ENCOUNTER — Ambulatory Visit: Payer: Medicaid Other | Admitting: Family Medicine

## 2019-05-08 ENCOUNTER — Telehealth: Payer: Self-pay | Admitting: Nurse Practitioner

## 2019-05-08 VITALS — BP 98/68 | HR 66 | Temp 97.0°F | Ht 59.0 in | Wt 115.2 lb

## 2019-05-08 VITALS — BP 86/56 | HR 66 | Temp 98.7°F | Ht 59.0 in

## 2019-05-08 DIAGNOSIS — D849 Immunodeficiency, unspecified: Secondary | ICD-10-CM | POA: Diagnosis present

## 2019-05-08 DIAGNOSIS — A6009 Herpesviral infection of other urogenital tract: Secondary | ICD-10-CM | POA: Diagnosis present

## 2019-05-08 DIAGNOSIS — I10 Essential (primary) hypertension: Secondary | ICD-10-CM | POA: Diagnosis not present

## 2019-05-08 DIAGNOSIS — I5032 Chronic diastolic (congestive) heart failure: Secondary | ICD-10-CM | POA: Diagnosis present

## 2019-05-08 DIAGNOSIS — E872 Acidosis: Secondary | ICD-10-CM | POA: Diagnosis present

## 2019-05-08 DIAGNOSIS — J1282 Pneumonia due to coronavirus disease 2019: Secondary | ICD-10-CM | POA: Diagnosis present

## 2019-05-08 DIAGNOSIS — K3184 Gastroparesis: Secondary | ICD-10-CM | POA: Diagnosis not present

## 2019-05-08 DIAGNOSIS — M5030 Other cervical disc degeneration, unspecified cervical region: Secondary | ICD-10-CM | POA: Diagnosis present

## 2019-05-08 DIAGNOSIS — R1900 Intra-abdominal and pelvic swelling, mass and lump, unspecified site: Secondary | ICD-10-CM | POA: Diagnosis not present

## 2019-05-08 DIAGNOSIS — Z7952 Long term (current) use of systemic steroids: Secondary | ICD-10-CM

## 2019-05-08 DIAGNOSIS — R531 Weakness: Secondary | ICD-10-CM

## 2019-05-08 DIAGNOSIS — N183 Chronic kidney disease, stage 3 unspecified: Secondary | ICD-10-CM

## 2019-05-08 DIAGNOSIS — R600 Localized edema: Secondary | ICD-10-CM

## 2019-05-08 DIAGNOSIS — M545 Low back pain: Secondary | ICD-10-CM | POA: Diagnosis present

## 2019-05-08 DIAGNOSIS — I959 Hypotension, unspecified: Secondary | ICD-10-CM

## 2019-05-08 DIAGNOSIS — H5461 Unqualified visual loss, right eye, normal vision left eye: Secondary | ICD-10-CM | POA: Diagnosis present

## 2019-05-08 DIAGNOSIS — Z09 Encounter for follow-up examination after completed treatment for conditions other than malignant neoplasm: Secondary | ICD-10-CM

## 2019-05-08 DIAGNOSIS — H401122 Primary open-angle glaucoma, left eye, moderate stage: Secondary | ICD-10-CM | POA: Diagnosis not present

## 2019-05-08 DIAGNOSIS — K219 Gastro-esophageal reflux disease without esophagitis: Secondary | ICD-10-CM | POA: Diagnosis present

## 2019-05-08 DIAGNOSIS — H409 Unspecified glaucoma: Secondary | ICD-10-CM | POA: Diagnosis present

## 2019-05-08 DIAGNOSIS — Z20822 Contact with and (suspected) exposure to covid-19: Secondary | ICD-10-CM | POA: Diagnosis present

## 2019-05-08 DIAGNOSIS — Z940 Kidney transplant status: Secondary | ICD-10-CM

## 2019-05-08 DIAGNOSIS — B370 Candidal stomatitis: Secondary | ICD-10-CM | POA: Diagnosis present

## 2019-05-08 DIAGNOSIS — N184 Chronic kidney disease, stage 4 (severe): Secondary | ICD-10-CM

## 2019-05-08 DIAGNOSIS — Z8616 Personal history of COVID-19: Secondary | ICD-10-CM

## 2019-05-08 DIAGNOSIS — R5383 Other fatigue: Secondary | ICD-10-CM

## 2019-05-08 DIAGNOSIS — Z7982 Long term (current) use of aspirin: Secondary | ICD-10-CM

## 2019-05-08 DIAGNOSIS — F17210 Nicotine dependence, cigarettes, uncomplicated: Secondary | ICD-10-CM | POA: Diagnosis present

## 2019-05-08 DIAGNOSIS — Z83511 Family history of glaucoma: Secondary | ICD-10-CM

## 2019-05-08 DIAGNOSIS — K7460 Unspecified cirrhosis of liver: Secondary | ICD-10-CM | POA: Diagnosis present

## 2019-05-08 DIAGNOSIS — Z0000 Encounter for general adult medical examination without abnormal findings: Secondary | ICD-10-CM | POA: Diagnosis not present

## 2019-05-08 DIAGNOSIS — I509 Heart failure, unspecified: Secondary | ICD-10-CM

## 2019-05-08 DIAGNOSIS — Z8673 Personal history of transient ischemic attack (TIA), and cerebral infarction without residual deficits: Secondary | ICD-10-CM

## 2019-05-08 DIAGNOSIS — Z79899 Other long term (current) drug therapy: Secondary | ICD-10-CM

## 2019-05-08 DIAGNOSIS — Z8711 Personal history of peptic ulcer disease: Secondary | ICD-10-CM

## 2019-05-08 DIAGNOSIS — Z79891 Long term (current) use of opiate analgesic: Secondary | ICD-10-CM

## 2019-05-08 DIAGNOSIS — R109 Unspecified abdominal pain: Secondary | ICD-10-CM

## 2019-05-08 DIAGNOSIS — A4189 Other specified sepsis: Secondary | ICD-10-CM | POA: Diagnosis present

## 2019-05-08 DIAGNOSIS — G8929 Other chronic pain: Secondary | ICD-10-CM | POA: Diagnosis present

## 2019-05-08 DIAGNOSIS — I132 Hypertensive heart and chronic kidney disease with heart failure and with stage 5 chronic kidney disease, or end stage renal disease: Secondary | ICD-10-CM | POA: Diagnosis present

## 2019-05-08 DIAGNOSIS — N186 End stage renal disease: Secondary | ICD-10-CM | POA: Diagnosis present

## 2019-05-08 DIAGNOSIS — J9601 Acute respiratory failure with hypoxia: Secondary | ICD-10-CM

## 2019-05-08 DIAGNOSIS — U071 COVID-19: Principal | ICD-10-CM | POA: Diagnosis present

## 2019-05-08 DIAGNOSIS — D631 Anemia in chronic kidney disease: Secondary | ICD-10-CM | POA: Diagnosis present

## 2019-05-08 DIAGNOSIS — Z888 Allergy status to other drugs, medicaments and biological substances status: Secondary | ICD-10-CM

## 2019-05-08 DIAGNOSIS — F329 Major depressive disorder, single episode, unspecified: Secondary | ICD-10-CM | POA: Diagnosis present

## 2019-05-08 DIAGNOSIS — D649 Anemia, unspecified: Secondary | ICD-10-CM

## 2019-05-08 DIAGNOSIS — M109 Gout, unspecified: Secondary | ICD-10-CM | POA: Diagnosis present

## 2019-05-08 DIAGNOSIS — Z91048 Other nonmedicinal substance allergy status: Secondary | ICD-10-CM

## 2019-05-08 DIAGNOSIS — M797 Fibromyalgia: Secondary | ICD-10-CM | POA: Diagnosis present

## 2019-05-08 DIAGNOSIS — N179 Acute kidney failure, unspecified: Secondary | ICD-10-CM | POA: Diagnosis present

## 2019-05-08 LAB — CBC
HCT: 23.8 % — ABNORMAL LOW (ref 36.0–46.0)
Hematocrit: 19.5 % — ABNORMAL LOW (ref 34.0–46.6)
Hemoglobin: 6.7 g/dL — CL (ref 11.1–15.9)
Hemoglobin: 7.6 g/dL — ABNORMAL LOW (ref 12.0–15.0)
MCH: 33.5 pg — ABNORMAL HIGH (ref 26.6–33.0)
MCH: 33.2 pg (ref 26.0–34.0)
MCHC: 34.4 g/dL (ref 31.5–35.7)
MCHC: 31.9 g/dL (ref 30.0–36.0)
MCV: 98 fL — ABNORMAL HIGH (ref 79–97)
MCV: 103.9 fL — ABNORMAL HIGH (ref 80.0–100.0)
NRBC: 1 % — ABNORMAL HIGH (ref 0–0)
Platelets: 170 10*3/uL (ref 150–450)
Platelets: 182 10*3/uL (ref 150–400)
RBC: 2 x10E6/uL — CL (ref 3.77–5.28)
RBC: 2.29 MIL/uL — ABNORMAL LOW (ref 3.87–5.11)
RDW: 21.9 % — ABNORMAL HIGH (ref 11.7–15.4)
RDW: 23.4 % — ABNORMAL HIGH (ref 11.5–15.5)
WBC: 2.9 10*3/uL — ABNORMAL LOW (ref 3.4–10.8)
WBC: 3.1 10*3/uL — ABNORMAL LOW (ref 4.0–10.5)
nRBC: 1 % — ABNORMAL HIGH (ref 0.0–0.2)

## 2019-05-08 LAB — BASIC METABOLIC PANEL
Anion gap: 13 (ref 5–15)
BUN/Creatinine Ratio: 18 (ref 9–23)
BUN: 82 mg/dL (ref 6–24)
BUN: 90 mg/dL — ABNORMAL HIGH (ref 6–20)
CO2: 15 mmol/L — ABNORMAL LOW (ref 20–29)
CO2: 15 mmol/L — ABNORMAL LOW (ref 22–32)
Calcium: 9.3 mg/dL (ref 8.7–10.2)
Calcium: 8.8 mg/dL — ABNORMAL LOW (ref 8.9–10.3)
Chloride: 105 mmol/L (ref 96–106)
Chloride: 107 mmol/L (ref 98–111)
Creatinine, Ser: 4.59 mg/dL — ABNORMAL HIGH (ref 0.57–1.00)
Creatinine, Ser: 5.22 mg/dL — ABNORMAL HIGH (ref 0.44–1.00)
GFR calc Af Amer: 12 mL/min/{1.73_m2} — ABNORMAL LOW (ref >59)
GFR calc Af Amer: 10 mL/min — ABNORMAL LOW (ref >60)
GFR calc non Af Amer: 10 mL/min/{1.73_m2} — ABNORMAL LOW (ref >59)
GFR calc non Af Amer: 9 mL/min — ABNORMAL LOW (ref >60)
Glucose, Bld: 108 mg/dL — ABNORMAL HIGH (ref 70–99)
Glucose: 89 mg/dL (ref 65–99)
Potassium: 4.4 mmol/L (ref 3.5–5.2)
Potassium: 4.3 mmol/L (ref 3.5–5.1)
Sodium: 138 mmol/L (ref 134–144)
Sodium: 135 mmol/L (ref 135–145)

## 2019-05-08 LAB — POCT GLYCOSYLATED HEMOGLOBIN (HGB A1C): Hemoglobin A1C: 4.8 % (ref 4.0–5.6)

## 2019-05-08 LAB — GLUCOSE, POCT (MANUAL RESULT ENTRY): POC Glucose: 115 mg/dl — AB (ref 70–99)

## 2019-05-08 LAB — RETICULOCYTES
Immature Retic Fract: 14.7 % (ref 2.3–15.9)
RBC.: 2.04 MIL/uL — ABNORMAL LOW (ref 3.87–5.11)
Retic Count, Absolute: 62.8 10*3/uL (ref 19.0–186.0)
Retic Ct Pct: 3.1 % (ref 0.4–3.1)

## 2019-05-08 LAB — PREPARE RBC (CROSSMATCH)

## 2019-05-08 MED ORDER — SODIUM CHLORIDE 0.9 % IV SOLN: 10.0000 mL/h | Freq: Once | INTRAVENOUS | Status: DC

## 2019-05-08 MED ORDER — SODIUM CHLORIDE 0.9% IV SOLUTION: Freq: Once | INTRAVENOUS | Status: DC

## 2019-05-08 MED ORDER — LACTATED RINGERS IV BOLUS
1000.0000 mL | Freq: Once | INTRAVENOUS | Status: AC
  Administered 2019-05-08: 1000 mL via INTRAVENOUS

## 2019-05-08 NOTE — Telephone Encounter (Signed)
Called Mrs. Tina Watkins. Kidney function is worse and BUN 82. I informed her that I am concerned her fatigue is due to uremia. I have informed her to contact her kidney doctor ASAP. She reported to me she will call today. If she feels worse, I have instructed her to go the ER. Labs also show she is acidotic (HCO2 15). She will contact them today and if not, will present to ER for further evaluation.   Tina Bells T. Audie Box, [place]  [address] [place], [place] [address] [phone]  4:59 PM

## 2019-05-08 NOTE — ED Notes (Signed)
IV team at bedside 

## 2019-05-08 NOTE — Telephone Encounter (Signed)
   I received a call from [facility] this evening re: abnormal labs on Tina Watkins (drawn earlier today in the office).  Hgb 6.7, down from 7.1 on 3/26.  She reports severe fatigue, which has been persistent and worsening over several weeks.  In setting of progressive anemia, BUN/Creat also elevated to 82/4.59.  Considering symptoms and lab findings, she will present to the ED tonight for repeat labs and probable transfusion.  Caller verbalized understanding and was grateful for the call back.  Lab Results  Component Value Date   CREATININE 4.59 (H) 05/08/2019   BUN 82 (HH) 05/08/2019   NA 138 05/08/2019   K 4.4 05/08/2019   CL 105 05/08/2019   CO2 15 (L) 05/08/2019    Hgb 6.7  Murray Hodgkins, NP 05/08/2019, 7:33 PM

## 2019-05-08 NOTE — ED Triage Notes (Signed)
Pt sent by PCP due to low hemoglobin level 6.7 and low BP 80/43 and 76/33.

## 2019-05-08 NOTE — ED Notes (Addendum)
Blood bank updated RN in regards to blood transfusion. Per blood bank there will be a delay in availability up to several hours. Dr. Rogene Houston informed of delay.   Orders for LR bolus obtain while pending blood arrival.

## 2019-05-08 NOTE — ED Provider Notes (Signed)
[facility] EMERGENCY DEPARTMENT Provider Note   CSN: [id number] Arrival date & time: 05/08/19  2047     History Chief Complaint  Patient presents with  . Low Hemoglobin  . Hypotension    Tina Watkins is a 54 y.o. female.  HPI   54 year old female with a past medical history of ESRD status post renal transplant complicated by CKD and transplant on darbepoetin alfa, azathipprine, prednisone, tacrolimus, HTN, HFpEF presenting to the emergency department referred from her primary care provider with concern for severe anemia with a hemoglobin of 6.7 on blood work obtained today complaining of fatigue.  Patient notes having associated generalized weakness.  Patient denies any hematemesis, hematochezia, melena, vaginal bleeding, easy bruising, epistaxis.  Patient denies any fevers, chills, congestion, rhinorrhea, shortness of breath, chest pain, abdominal pain, change in bowel or bladder function.  Patient notes she has had some ongoing cough that has been unchanged and chronic nature.  Patient is on immunosuppressive medications.  Past Medical History:  Diagnosis Date  . Bacteremia due to Gram-negative bacteria 05/23/2011  . Blind    right eye  . Blind right eye   . CHF (congestive heart failure) ([place])   . Chronic lower back pain   . Complication of anesthesia    "woke up during OR; I have an extremely high tolerance" (12/11/2011) 1 procedure was graft; the other procedures were procedures that are typically done with sedation.  . DDD (degenerative disc disease), cervical   . Depression   . Dysrhythmia    "tachycardia" (12/11/2011) new onset afib 10/15/14 EKG  . E coli bacteremia 06/18/2011  . Elevated LDL cholesterol level 12/2018  . ESRD (end stage renal disease) ([place]) 06/12/2011  . Fibromyalgia   . Gastroparesis   . Gastroparesis   . GERD (gastroesophageal reflux disease)   . Glaucoma    right eye  . Gout   . H/O carpal tunnel syndrome   .  Headache(784.0)    "not often anymore" (12/11/2011)  . Herpes genitalia 1994  . History of blood transfusion    "more than a few times" (12/11/2011)  . History of stomach ulcers   . Hypotension   . Iron deficiency anemia   . New onset a-fib ([place])    10/15/14 EKG  . Osteopenia   . Peripheral neuropathy 11/2018  . Seizures ([place]) 1994   "post transplant; only have had that one" (12/11/2011)  . Spinal stenosis in cervical region   . Stroke [facility])     left basal ganglia lacunar infarct; Right frontal lobe lacunar infarct.  . Stroke [facility]) ~ 1999; 2001   "briefly lost my vision; lost my right eye" (12/11/2011)  . Vitamin D deficiency 12/2018    Patient Active Problem List   Diagnosis Date Noted  . Swollen abdomen 01/04/2019  . DDD (degenerative disc disease), cervical 12/06/2018  . Neuropathy 12/06/2018  . Paresthesia 10/11/2018  . Neck pain 10/11/2018  . Tobacco abuse 11/20/2017  . Right leg swelling 11/20/2017  . CKD (chronic kidney disease), stage III 11/20/2017  . Right leg pain 11/20/2017  . Stroke (cerebrum) ([place]) 11/20/2017  . GERD (gastroesophageal reflux disease) 11/20/2017  . Acute venous embolism and thrombosis of deep vessels of proximal lower extremity, right ([place]) 11/20/2017  . 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 Thrush, oral   . AKI (acute kidney injury) ([place])   . Multifocal pneumonia 08/09/2017  . Chest pain 09/29/2015  .  Cervical stenosis of spinal canal 01/08/2015  . Urinary tract infectious disease   . Muscle spasms of neck 06/15/2014  . Bleeding hemorrhoid 06/15/2014  . Anemia in chronic kidney disease 06/15/2014  . Acute on chronic renal failure ([place]) 06/13/2014  . Pyelonephritis, acute 06/13/2014  . Sepsis ([place]) 06/13/2014  . History of kidney transplant   . Chronic pain syndrome 04/09/2012  . Dehydration, mild 04/09/2012  . Gout attack 06/23/2011  . Herpes infection 06/23/2011  . Anxiety 06/23/2011  . E  coli bacteremia 06/18/2011  . ESRD (end stage renal disease) ([place]) 06/12/2011  . UTI (lower urinary tract infection) 06/12/2011  . Bacteremia due to Gram-negative bacteria 05/23/2011  . History of renal transplantation 05/22/2011  . Septic shock(785.52) 05/22/2011  . Acute on chronic kidney failure ([place]) 05/22/2011  . Gastroparesis 04/24/2008  . WEIGHT LOSS 08/24/2007  . NAUSEA AND VOMITING 08/24/2007    Past Surgical History:  Procedure Laterality Date  . ANTERIOR CERVICAL DECOMP/DISCECTOMY FUSION N/A 01/08/2015   Procedure: Anterior Cervical Three-Four/Four-Five Decompression/Diskectomy/Fusion;  Surgeon: Leeroy Cha, MD;  Location: [place] [facility];  Service: Neurosurgery;  Laterality: N/A;  C3-4 C4-5 Anterior cervical decompression/diskectomy/fusion  . APPENDECTOMY  ~ 2004  . CATARACT EXTRACTION     right eye  . COLONOSCOPY    . ENUCLEATION  2001   "right"  . ESOPHAGOGASTRODUODENOSCOPY (EGD) WITH PROPOFOL N/A 04/21/2012   Procedure: ESOPHAGOGASTRODUODENOSCOPY (EGD) WITH PROPOFOL;  Surgeon: Milus Banister, MD;  Location: [facility];  Service: Endoscopy;  Laterality: N/A;  . 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 [place]; 1999; 2005   "right"  . MULTIPLE TOOTH EXTRACTIONS    . RIGHT HEART CATH N/A 03/23/2019   Procedure: RIGHT HEART CATH;  Surgeon: Jolaine Artist, MD;  Location: [place] [facility];  Service: Cardiovascular;  Laterality: N/A;  . TONSILLECTOMY    . TOTAL NEPHRECTOMY  1988?; 1994; 2005     OB History    Gravida  1   Para      Term      Preterm      AB  1   Living        SAB  1   TAB      Ectopic      Multiple      Live Births              Family History  Problem Relation Age of Onset  . Glaucoma Mother   . Pancreatic cancer Father   . Multiple sclerosis Brother   . Hypertension Maternal Grandmother   . Breast cancer Neg Hx     Social History    Tobacco Use  . Smoking status: Current Every Day Smoker    Packs/day: 0.20    Years: 28.00    Pack years: 5.60    Types: Cigarettes  . Smokeless tobacco: Never Used  Substance Use Topics  . Alcohol use: Yes    Comment: rare  . Drug use: Yes    Types: Marijuana    Comment: occasional use    Home Medications Prior to Admission medications   Medication Sig Start Date End Date Taking? Authorizing Provider  aspirin EC 81 MG tablet Take 81 mg by mouth daily.     [provider]  azaTHIOprine (IMURAN) 50 MG tablet Take 100 mg by mouth daily.     [provider]  colchicine  0.6 MG tablet Take 0.6-1.2 mg by mouth See admin instructions. Take 1.2 mg by mouth at onset, then take 0.6 mg three times a day as need for gout flare    [provider]  Darbepoetin Alfa (ARANESP, ALBUMIN FREE, IJ) Inject as directed every 14 (fourteen) days.    [provider]  fluticasone (FLONASE) 50 MCG/ACT nasal spray Place 1 spray into both nostrils daily as needed for allergies or rhinitis.    [provider]  furosemide (LASIX) 80 MG tablet Take 1 tablet (80 mg total) by mouth daily. 04/19/19   Bensimhon, Shaune Pascal, MD  gabapentin (NEURONTIN) 100 MG capsule Take 100 mg by mouth 2 (two) times daily.  09/27/18   [provider]  latanoprost (XALATAN) 0.005 % ophthalmic solution Place 1 drop into the left eye at bedtime. 03/30/17   [provider]  lidocaine (LIDODERM) 5 % Place 1 patch onto the skin daily as needed (for pain- remove old patch first).  10/28/18   [provider]  metoCLOPramide (REGLAN) 5 MG tablet Take 1 tablet (5 mg total) by mouth 2 (two) times daily. 04/04/19   Milus Banister, MD  metolazone (ZAROXOLYN) 2.5 MG tablet Take 1 tablet (2.5 mg total) by mouth once as needed for up to 1 dose (once a week if needed for weight gain). On Monday and Friday 03/24/19   Domenic Polite, MD  nystatin (MYCOSTATIN) 100000 UNIT/ML suspension Use  as directed 5 mLs in the mouth or throat daily as needed (FOR THRUSH).     [provider]  omeprazole (PRILOSEC) 40 MG capsule Take 40 mg by mouth 2 (two) times daily.    [provider]  ondansetron (ZOFRAN-ODT) 4 MG disintegrating tablet Take 4 mg by mouth every 8 (eight) hours as needed for nausea or vomiting.    [provider]  oxyCODONE-acetaminophen (PERCOCET) 10-325 MG tablet Take 1 tablet by mouth See admin instructions. Take 1 tablet by mouth every four to six hours as needed for pain    [provider]  predniSONE (DELTASONE) 5 MG tablet Take 5 mg by mouth daily. 06/22/11   [name], MD  PROVENTIL HFA 108 [phone] Base) MCG/ACT inhaler Inhale 2 puffs into the lungs 4 (four) times daily as needed for shortness of breath. 08/04/17   [provider]  tacrolimus (PROGRAF) 1 MG capsule Take 2-3 mg by mouth See admin instructions. Take 3 mg by mouth in the morning and 2 mg at bedtime 06/22/11   [name], MD  Vitamin D, Ergocalciferol, (DRISDOL) 1.25 MG (50000 UNIT) CAPS capsule Take 50,000 Units by mouth every 7 (seven) days. On sundays    [provider]  zolpidem (AMBIEN) 10 MG tablet Take 10 mg by mouth at bedtime as needed for sleep.    [provider]    Allergies    Levofloxacin and Tape  Review of Systems   Review of Systems  Constitutional: Positive for activity change and fatigue. Negative for appetite change, chills, diaphoresis and fever.  HENT: Negative for congestion, nosebleeds and rhinorrhea.   Respiratory: Negative for cough, shortness of breath and wheezing.   Cardiovascular: Negative for chest pain.  Gastrointestinal: Negative for abdominal distention, abdominal pain, anal bleeding, blood in stool, constipation, diarrhea, nausea and vomiting.  Genitourinary: Negative for decreased urine volume, difficulty urinating, dysuria, frequency and urgency.  Musculoskeletal: Negative for gait problem.   Skin: Negative for color change, pallor and wound.  Neurological: Negative for dizziness,  syncope, weakness, light-headedness and headaches.  All other systems reviewed and are negative.   Physical Exam Updated Vital Signs BP (!) 89/47   Pulse 72   Temp 98.6 F (37 C) (Oral)   Resp 14   Ht 4\' 11"  (1.499 m)   Wt 52.3 kg   SpO2 99%   BMI 23.27 kg/m   Physical Exam Vitals and nursing note reviewed.  Constitutional:      General: She is not in acute distress.    Appearance: Normal appearance. She is normal weight. She is not ill-appearing.  HENT:     Head: Normocephalic.     Right Ear: External ear normal.     Left Ear: External ear normal.     Nose: Nose normal.     Mouth/Throat:     Mouth: Mucous membranes are moist.     Pharynx: Oropharynx is clear.  Eyes:     Extraocular Movements: Extraocular movements intact.     Comments: Conjunctival pallor  Cardiovascular:     Rate and Rhythm: Normal rate and regular rhythm.     Pulses: Normal pulses.     Heart sounds: Normal heart sounds.  Pulmonary:     Effort: Pulmonary effort is normal. No respiratory distress.     Breath sounds: Normal breath sounds. No wheezing or rhonchi.  Abdominal:     General: Bowel sounds are normal.     Palpations: Abdomen is soft.     Tenderness: There is no abdominal tenderness. There is no guarding.  Musculoskeletal:     Cervical back: Normal range of motion.     Right lower leg: No edema.     Left lower leg: No edema.  Skin:    General: Skin is warm and dry.     Capillary Refill: Capillary refill takes 2 to 3 seconds.  Neurological:     General: No focal deficit present.     Mental Status: She is alert and oriented to person, place, and time. Mental status is at baseline.  Psychiatric:        Mood and Affect: Mood normal.     ED Results / Procedures / Treatments   Labs (all labs ordered are listed, but only abnormal results are displayed) Labs Reviewed  CBC - Abnormal; Notable for  the following components:      Result Value   WBC 3.1 (*)    RBC 2.29 (*)    Hemoglobin 7.6 (*)    HCT 23.8 (*)    MCV 103.9 (*)    RDW 23.4 (*)    nRBC 1.0 (*)    All other components within normal limits  BASIC METABOLIC PANEL - Abnormal; Notable for the following components:   CO2 15 (*)    Glucose, Bld 108 (*)    BUN 90 (*)    Creatinine, Ser 5.22 (*)    Calcium 8.8 (*)    GFR calc non Af Amer 9 (*)    GFR calc Af Amer 10 (*)    All other components within normal limits  FOLATE - Abnormal; Notable for the following components:   Folate 4.9 (*)    All other components within normal limits  RETICULOCYTES - Abnormal; Notable for the following components:   RBC. 2.04 (*)    All other components within normal limits  IRON AND TIBC - Abnormal; Notable for the following components:   TIBC 189 (*)    Saturation Ratios 53 (*)    All other components within normal  limits  FERRITIN - Abnormal; Notable for the following components:   Ferritin 1,744 (*)    All other components within normal limits  SARS CORONAVIRUS 2 (TAT 6-24 HRS)  CULTURE, BLOOD (ROUTINE X 2)  CULTURE, BLOOD (ROUTINE X 2)  GRAM STAIN  URINE CULTURE  VITAMIN B12  URINALYSIS, COMPLETE (UACMP) WITH MICROSCOPIC  LACTIC ACID, PLASMA  LACTIC ACID, PLASMA  POC OCCULT BLOOD, ED  TYPE AND SCREEN  PREPARE RBC (CROSSMATCH)    EKG None  Radiology No results found.  Procedures Procedures (including critical care time)  Medications Ordered in ED Medications  0.9 %  sodium chloride infusion (Manually program via Guardrails IV Fluids) ( Intravenous Not Given 05/08/19 2215)  0.9 %  sodium chloride infusion (0 mL/hr Intravenous Hold 05/08/19 2228)  lactated ringers bolus 1,000 mL (1,000 mLs Intravenous New Bag/Given 05/08/19 2233)    ED Course  I have reviewed the triage vital signs and the nursing notes.  Pertinent labs & imaging results that were available during my care of the patient were reviewed by me and  considered in my medical decision making (see chart for details).    MDM Rules/Calculators/A&P                      54 year old female with a past medical history of ESRD status post renal transplant complicated by CKD and transplant, HTN, HFpEF presenting to the emergency department referred from her primary care provider with concern for severe anemia with a hemoglobin of 6.7 on blood work obtained today complaining of fatigue.  Differential diagnoses considered include anemia of chronic disease, medication side effect, less likely acute hemorrhage, possibly secondary to chronic kidney disease, perhaps component of iron deficiency anemia though more likely vitamin deficiency in the setting of macrocytosis.   Initial interventions LR bolus while pending PRBC transfusion  Labs demonstrated BUN elevated to 90 with a creatinine of 5.22 appears slightly elevated compared to previous, mild hypocalcemia at 8.8, bicarb diminished to 15, normal reticulocyte count percentage and absolute count, macrocytic anemia with Hgb 7.6 and MCV 103.9, leukopenia to 3.1 consistent with immunosuppressive medications. Ferritin elevated to 1744, TIBC diminished to 189, Iron level 100, Vit B12 normal at 702, folate deficiency to 4.9 will supplement.   ECG interpreted by me demonstrates sinus rhythm at 63 bpm, normal axis, normal intervals, no ST or T wave change suggestive of acute ischemia, possible old lateral infarct, no signs of right heart strain, overall similar compared to previous on 04/21/2019  Upon reassessment patient remains hypotensive despite resus with 1 L IVF.   Given the above findings, my suspicion is that the patient has symptomatic anemia c/b hypovolemia requiring blood transfusion. Will expand w/u for hypotension to consider distributive shock 2/2 infection versus hemorrhagic shock due to GIB, FOBT pending.   Pending urine, lactate, CXR, FOBT and reassessment following blood transfusion in  anticipation of admission. Low suspicion for septic shock in this patient appears more consistent with hypovolemia secondary to smoldering decompensated chronic anemia likely 2/2 renal dysfunction and immunosuppressive medication side effects possibly c/b an occult GIB. Of note patient's folate   Care handed off to Quincy Carnes PA-C at 0000. Please see this providers note for final evaluation and disposition of the patient.  Care of this patient was discussed with Dr. Ashok Cordia, who directly supervised the care of this patient.  Final Clinical Impression(s) / ED Diagnoses Final diagnoses:  None    Rx / DC Orders ED Discharge Orders  None       Filbert Berthold, MD 05/09/19 Tina Watkins    Lajean Saver, MD 05/09/19 [phone]

## 2019-05-08 NOTE — Patient Instructions (Signed)
Medication Instructions:  The current medical regimen is effective;  continue present plan and medications.  *If you need a refill on your cardiac medications before your next appointment, please call your pharmacy*   Lab Work: CBC, BMET today  If you have labs (blood work) drawn today and your tests are completely normal, you will receive your results only by: [name] MyChart Message (if you have MyChart) OR . A paper copy in the mail If you have any lab test that is abnormal or we need to change your treatment, we will call you to review the results.   Follow-Up: At [facility], you and your health needs are our priority.  As part of our continuing mission to provide you with exceptional heart care, we have created designated Provider Care Teams.  These Care Teams include your primary Cardiologist (physician) and Advanced Practice Providers (APPs -  Physician Assistants and Nurse Practitioners) who all work together to provide you with the care you need, when you need it.  We recommend signing up for the patient portal called "MyChart".  Sign up information is provided on this After Visit Summary.  MyChart is used to connect with patients for Virtual Visits (Telemedicine).  Patients are able to view lab/test results, encounter notes, upcoming appointments, etc.  Non-urgent messages can be sent to your provider as well.   To learn more about what you can do with MyChart, go to [email or web address].    Your next appointment:   To follow up with heart failure clinic

## 2019-05-08 NOTE — Progress Notes (Signed)
Patient [facility] Follow Up  Subjective:  Patient ID: Tina Watkins, female    DOB: [birth year]  Age: 54 y.o. MRN: [id number]  CC:  Chief Complaint  Patient presents with  . Hospitalization Follow-up    Ed 04/17/2019 [facility]    HPI Tina Watkins is a 54 year old female who presents for Follow Up today.   Past Medical History:  Diagnosis Date  . Bacteremia due to Gram-negative bacteria 05/23/2011  . Blind    right eye  . Blind right eye   . CHF (congestive heart failure) ([place])   . Chronic lower back pain   . Complication of anesthesia    "woke up during OR; I have an extremely high tolerance" (12/11/2011) 1 procedure was graft; the other procedures were procedures that are typically done with sedation.  . DDD (degenerative disc disease), cervical   . Depression   . Dysrhythmia    "tachycardia" (12/11/2011) new onset afib 10/15/14 EKG  . E coli bacteremia 06/18/2011  . Elevated LDL cholesterol level 12/2018  . ESRD (end stage renal disease) ([place]) 06/12/2011  . Fibromyalgia   . Gastroparesis   . Gastroparesis   . GERD (gastroesophageal reflux disease)   . Glaucoma    right eye  . Gout   . H/O carpal tunnel syndrome   . Headache(784.0)    "not often anymore" (12/11/2011)  . Herpes genitalia 1994  . History of blood transfusion    "more than a few times" (12/11/2011)  . History of stomach ulcers   . Hypotension   . Iron deficiency anemia   . New onset a-fib ([place])    10/15/14 EKG  . Osteopenia   . Peripheral neuropathy 11/2018  . Seizures ([place]) 1994   "post transplant; only have had that one" (12/11/2011)  . Spinal stenosis in cervical region   . Stroke [facility])     left basal ganglia lacunar infarct; Right frontal lobe lacunar infarct.  . Stroke [facility]) ~ 1999; 2001   "briefly lost my vision; lost my right eye" (12/11/2011)  . Vitamin D deficiency 12/2018   Current Status: Since her last office visit,  she has had a ED visit for CHF Exacerbation on 04/17/2019. Today, she is doing well with no complaints. She continue to follow up with Cardiology as needed. She is no longer on dialysis, but follows up with Nephrology. She is currently on a fluid restriction of 48 oz of water daily. She has increased weakness today. She has appointment with Nephrology on 05/11/2019, which she is scheduled for Procrit injection. Her blood pressures are decreased today.  She has not had any headaches, visual changes, dizziness, and falls. No chest pain, heart palpitations, cough and shortness of breath reported. She denies fevers, chills, fatigue, recent infections, weight loss, and night sweats.  No reports of GI problems such as nausea, vomiting, diarrhea, and constipation. She has no reports of blood in stools, dysuria and hematuria. No depression or anxiety is reported today. She denies suicidal ideations, homicidal ideations, or auditory hallucinations. She is taking all medications as prescribed. She denies pain today.   Past Surgical History:  Procedure Laterality Date  . ANTERIOR CERVICAL DECOMP/DISCECTOMY FUSION N/A 01/08/2015   Procedure: Anterior Cervical Three-Four/Four-Five Decompression/Diskectomy/Fusion;  Surgeon: Leeroy Cha, MD;  Location: [facility];  Service: Neurosurgery;  Laterality: N/A;  C3-4 C4-5 Anterior cervical decompression/diskectomy/fusion  . APPENDECTOMY  ~ 2004  . CATARACT EXTRACTION  right eye  . COLONOSCOPY    . ENUCLEATION  2001   "right"  . ESOPHAGOGASTRODUODENOSCOPY (EGD) WITH PROPOFOL N/A 04/21/2012   Procedure: ESOPHAGOGASTRODUODENOSCOPY (EGD) WITH PROPOFOL;  Surgeon: Milus Banister, MD;  Location: [facility];  Service: Endoscopy;  Laterality: N/A;  . 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 [place]; 1999; 2005   "right"  . MULTIPLE TOOTH EXTRACTIONS    . RIGHT HEART CATH N/A 03/23/2019    Procedure: RIGHT HEART CATH;  Surgeon: Jolaine Artist, MD;  Location: [facility];  Service: Cardiovascular;  Laterality: N/A;  . TONSILLECTOMY    . TOTAL NEPHRECTOMY  1988?; 1994; 2005    Family History  Problem Relation Age of Onset  . Glaucoma Mother   . Pancreatic cancer Father   . Multiple sclerosis Brother   . Hypertension Maternal Grandmother   . Breast cancer Neg Hx     Social History   Socioeconomic History  . Marital status: Single    Spouse name: Not on file  . Number of children: 0  . Years of education: some college  . Highest education level: Not on file  Occupational History  . Occupation: disabled    Fish farm manager: UNEMPLOYED  Tobacco Use  . Smoking status: Current Every Day Smoker    Packs/day: 0.20    Years: 28.00    Pack years: 5.60    Types: Cigarettes  . Smokeless tobacco: Never Used  Substance and Sexual Activity  . Alcohol use: Yes    Comment: rare  . Drug use: Yes    Types: Marijuana    Comment: occasional use  . Sexual activity: Not Currently  Other Topics Concern  . Not on file  Social History Narrative   Right-handed.   Four cups caffeine per day.   Lives alone.   Social Determinants of Health   Financial Resource Strain:   . Difficulty of Paying Living Expenses:   Food Insecurity:   . Worried About Charity fundraiser in the Last Year:   . Arboriculturist in the Last Year:   Transportation Needs:   . Film/video editor (Medical):   [name] Lack of Transportation (Non-Medical):   Physical Activity:   . Days of Exercise per Week:   . Minutes of Exercise per Session:   Stress:   . Feeling of Stress :   Social Connections:   . Frequency of Communication with Friends and Family:   . Frequency of Social Gatherings with Friends and Family:   . Attends Religious Services:   . Active Member of Clubs or Organizations:   . Attends Archivist Meetings:   [name] Marital Status:   Intimate Partner Violence:   . Fear of  Current or Ex-Partner:   . Emotionally Abused:   [name] Physically Abused:   . Sexually Abused:     No facility-administered medications prior to visit.   Outpatient Medications Prior to Visit  Medication Sig Dispense Refill  . aspirin EC 81 MG tablet Take 81 mg by mouth daily.     [name] azaTHIOprine (IMURAN) 50 MG tablet Take 100 mg by mouth daily.     . colchicine 0.6 MG tablet Take 0.6-1.2 mg by mouth See admin instructions. Take 1.2 mg by mouth at onset, then take 0.6 mg three times a day as need for gout flare    . Darbepoetin  Alfa (ARANESP, ALBUMIN FREE, IJ) Inject as directed every 14 (fourteen) days.    . fluticasone (FLONASE) 50 MCG/ACT nasal spray Place 1 spray into both nostrils daily as needed for allergies or rhinitis.    . furosemide (LASIX) 80 MG tablet Take 1 tablet (80 mg total) by mouth daily. 30 tablet 5  . gabapentin (NEURONTIN) 100 MG capsule Take 100 mg by mouth 2 (two) times daily.     [name] latanoprost (XALATAN) 0.005 % ophthalmic solution Place 1 drop into the left eye at bedtime.  3  . lidocaine (LIDODERM) 5 % Place 1 patch onto the skin daily as needed (for pain- remove old patch first).     [name] metoCLOPramide (REGLAN) 5 MG tablet Take 1 tablet (5 mg total) by mouth 2 (two) times daily. 180 tablet 3  . metolazone (ZAROXOLYN) 2.5 MG tablet Take 1 tablet (2.5 mg total) by mouth once as needed for up to 1 dose (once a week if needed for weight gain). On Monday and Friday    . nystatin (MYCOSTATIN) 100000 UNIT/ML suspension Use as directed 5 mLs in the mouth or throat daily as needed (FOR THRUSH).     [name] omeprazole (PRILOSEC) 40 MG capsule Take 40 mg by mouth 2 (two) times daily.    . ondansetron (ZOFRAN-ODT) 4 MG disintegrating tablet Take 4 mg by mouth every 8 (eight) hours as needed for nausea or vomiting.    [name] oxyCODONE-acetaminophen (PERCOCET) 10-325 MG tablet Take 1 tablet by mouth See admin instructions. Take 1 tablet by mouth every four to six hours as needed for pain    .  predniSONE (DELTASONE) 5 MG tablet Take 5 mg by mouth daily.    [name] PROVENTIL HFA 108 (90 Base) MCG/ACT inhaler Inhale 2 puffs into the lungs 4 (four) times daily as needed for shortness of breath.  2  . tacrolimus (PROGRAF) 1 MG capsule Take 2-3 mg by mouth See admin instructions. Take 3 mg by mouth in the morning and 2 mg at bedtime    . Vitamin D, Ergocalciferol, (DRISDOL) 1.25 MG (50000 UNIT) CAPS capsule Take 50,000 Units by mouth every 7 (seven) days. On sundays    . zolpidem (AMBIEN) 10 MG tablet Take 10 mg by mouth at bedtime as needed for sleep.      Allergies  Allergen Reactions  . Levofloxacin Itching and Rash  . Tape Other (See Comments)    "Certain surgical tapes peel off my skin"    ROS Review of Systems  Constitutional: Positive for fatigue (increased).  HENT: Negative.   Eyes: Negative.   Respiratory: Positive for cough and shortness of breath (occasional ).   Cardiovascular: Negative.   Gastrointestinal: Negative.   Endocrine: Negative.   Genitourinary: Negative.   Musculoskeletal: Positive for arthralgias (generalized ) and back pain (chronic back pain (DDD)).  Skin: Negative.   Allergic/Immunologic: Negative.   Neurological: Positive for dizziness (occasional ) and headaches (occasional ).  Hematological: Negative.   Psychiatric/Behavioral: Positive for agitation. The patient is nervous/anxious.        Tearful today.       Objective:    Physical Exam  Constitutional: She is oriented to person, place, and time. She appears well-developed and well-nourished.  HENT:  Head: Atraumatic.  Eyes: Conjunctivae are normal.  Cardiovascular: Normal rate, regular rhythm, normal heart sounds and intact distal pulses.  Pulmonary/Chest: Effort normal and breath sounds normal.  Abdominal: Soft. Bowel sounds are normal. She exhibits distension.  Musculoskeletal:  General: Edema (lower extremity edema) present. Normal range of motion.     Cervical back: Normal range  of motion and neck supple.  Neurological: She is alert and oriented to person, place, and time. She has normal reflexes.  Skin: Skin is warm and dry.  Psychiatric: She has a normal mood and affect. Her behavior is normal. Judgment and thought content normal.  Nursing note and vitals reviewed.   BP (!) 86/56   Pulse 66   Temp 98.7 F (37.1 C) (Oral)   Ht 4\' 11"  (1.499 m)   SpO2 100%   BMI 23.27 kg/m  Wt Readings from Last 3 Encounters:  05/08/19 115 lb 3.2 oz (52.3 kg)  05/08/19 115 lb 3.2 oz (52.3 kg)  04/19/19 119 lb 9.6 oz (54.3 kg)     Health Maintenance Due  Topic Date Due  . TETANUS/TDAP  Never done  . PAP SMEAR-Modifier  Never done  . COLONOSCOPY  Never done    There are no preventive care reminders to display for this patient.  Lab Results  Component Value Date   TSH 0.878 12/06/2018   Lab Results  Component Value Date   WBC 2.9 (L) 05/08/2019   HGB 6.7 (LL) 05/08/2019   HCT 19.5 (L) 05/08/2019   MCV 98 (H) 05/08/2019   PLT 170 05/08/2019   Lab Results  Component Value Date   NA 138 05/08/2019   K 4.4 05/08/2019   CO2 15 (L) 05/08/2019   GLUCOSE 89 05/08/2019   BUN 82 (HH) 05/08/2019   CREATININE 4.59 (H) 05/08/2019   BILITOT 1.1 04/10/2019   ALKPHOS 103 04/10/2019   AST 22 04/10/2019   ALT 14 04/10/2019   PROT 6.2 (L) 04/10/2019   ALBUMIN 3.7 04/10/2019   CALCIUM 9.3 05/08/2019   ANIONGAP 16 (H) 04/17/2019   Lab Results  Component Value Date   CHOL 214 (H) 12/06/2018   Lab Results  Component Value Date   HDL 89 12/06/2018   Lab Results  Component Value Date   LDLCALC 105 (H) 12/06/2018   Lab Results  Component Value Date   TRIG 118 12/06/2018   Lab Results  Component Value Date   CHOLHDL 2.4 12/06/2018   Lab Results  Component Value Date   HGBA1C 4.8 05/08/2019      Assessment & Plan:   1. Hospital discharge follow-up  2. Gastroparesis Stable today.   3. Congestive heart failure, unspecified HF chronicity,  unspecified heart failure type ([place])  4. Hypotension, unspecified hypotension type Patient is to report to ED if she becomes symptomatic.  5. Stage 3 chronic kidney disease, unspecified whether stage 3a or 3b CDK  6. DDD (degenerative disc disease), cervical - Ambulatory referral to Physical Therapy  7. Swollen abdomen  8. Bilateral lower extremity edema  9. Weakness  10. Healthcare maintenance - POCT glycosylated hemoglobin (Hb A1C) - TSH - POCT glucose (manual entry)  11. Follow up She will follow up in 1 months.  No orders of the defined types were placed in this encounter.  Orders Placed This Encounter  Procedures  . TSH  . Ambulatory referral to Physical Therapy  . POCT glycosylated hemoglobin (Hb A1C)  . POCT glucose (manual entry)    Kathe Becton,  MSN, FNP-BC [facility] Patient Care [facility] [address] [place], [place] [address] [phone] fax    Problem List Items Addressed This Visit      Digestive   Gastroparesis  Musculoskeletal and Integument   DDD (degenerative disc disease), cervical   Relevant Orders   Ambulatory referral to Physical Therapy     Genitourinary   CKD (chronic kidney disease), stage III     Other   Swollen abdomen    Other Visit Diagnoses    Hospital discharge follow-up    -  Primary   Congestive heart failure, unspecified HF chronicity, unspecified heart failure type (HCC)       Hypotension, unspecified hypotension type       Bilateral lower extremity edema       Weakness       Healthcare maintenance       Relevant Orders   POCT glycosylated hemoglobin (Hb A1C) (Completed)   TSH   POCT glucose (manual entry) (Completed)   Follow up          No orders of the defined types were placed in this encounter.   Follow-up: Return in about 1 month (around 06/07/2019).    [name], FNP

## 2019-05-09 ENCOUNTER — Inpatient Hospital Stay (HOSPITAL_COMMUNITY): Payer: Medicaid Other

## 2019-05-09 DIAGNOSIS — U071 COVID-19: Secondary | ICD-10-CM | POA: Diagnosis not present

## 2019-05-09 DIAGNOSIS — Z20822 Contact with and (suspected) exposure to covid-19: Secondary | ICD-10-CM | POA: Diagnosis present

## 2019-05-09 DIAGNOSIS — B370 Candidal stomatitis: Secondary | ICD-10-CM | POA: Diagnosis present

## 2019-05-09 DIAGNOSIS — R1111 Vomiting without nausea: Secondary | ICD-10-CM | POA: Diagnosis not present

## 2019-05-09 DIAGNOSIS — Z992 Dependence on renal dialysis: Secondary | ICD-10-CM | POA: Diagnosis not present

## 2019-05-09 DIAGNOSIS — N186 End stage renal disease: Secondary | ICD-10-CM | POA: Diagnosis not present

## 2019-05-09 DIAGNOSIS — F329 Major depressive disorder, single episode, unspecified: Secondary | ICD-10-CM | POA: Diagnosis present

## 2019-05-09 DIAGNOSIS — D696 Thrombocytopenia, unspecified: Secondary | ICD-10-CM | POA: Diagnosis not present

## 2019-05-09 DIAGNOSIS — M797 Fibromyalgia: Secondary | ICD-10-CM | POA: Diagnosis present

## 2019-05-09 DIAGNOSIS — N179 Acute kidney failure, unspecified: Secondary | ICD-10-CM | POA: Diagnosis not present

## 2019-05-09 DIAGNOSIS — E872 Acidosis: Secondary | ICD-10-CM | POA: Diagnosis present

## 2019-05-09 DIAGNOSIS — I509 Heart failure, unspecified: Secondary | ICD-10-CM

## 2019-05-09 DIAGNOSIS — R109 Unspecified abdominal pain: Secondary | ICD-10-CM | POA: Diagnosis not present

## 2019-05-09 DIAGNOSIS — I5033 Acute on chronic diastolic (congestive) heart failure: Secondary | ICD-10-CM | POA: Diagnosis not present

## 2019-05-09 DIAGNOSIS — I959 Hypotension, unspecified: Secondary | ICD-10-CM | POA: Diagnosis not present

## 2019-05-09 DIAGNOSIS — N185 Chronic kidney disease, stage 5: Secondary | ICD-10-CM | POA: Diagnosis not present

## 2019-05-09 DIAGNOSIS — G8929 Other chronic pain: Secondary | ICD-10-CM | POA: Diagnosis present

## 2019-05-09 DIAGNOSIS — I5032 Chronic diastolic (congestive) heart failure: Secondary | ICD-10-CM | POA: Diagnosis present

## 2019-05-09 DIAGNOSIS — M109 Gout, unspecified: Secondary | ICD-10-CM | POA: Diagnosis present

## 2019-05-09 DIAGNOSIS — R1084 Generalized abdominal pain: Secondary | ICD-10-CM | POA: Diagnosis not present

## 2019-05-09 DIAGNOSIS — D849 Immunodeficiency, unspecified: Secondary | ICD-10-CM | POA: Diagnosis present

## 2019-05-09 DIAGNOSIS — J1282 Pneumonia due to coronavirus disease 2019: Secondary | ICD-10-CM | POA: Diagnosis not present

## 2019-05-09 DIAGNOSIS — Z8616 Personal history of COVID-19: Secondary | ICD-10-CM

## 2019-05-09 DIAGNOSIS — A4189 Other specified sepsis: Secondary | ICD-10-CM | POA: Diagnosis present

## 2019-05-09 DIAGNOSIS — D631 Anemia in chronic kidney disease: Secondary | ICD-10-CM | POA: Diagnosis not present

## 2019-05-09 DIAGNOSIS — I132 Hypertensive heart and chronic kidney disease with heart failure and with stage 5 chronic kidney disease, or end stage renal disease: Secondary | ICD-10-CM | POA: Diagnosis not present

## 2019-05-09 DIAGNOSIS — K219 Gastro-esophageal reflux disease without esophagitis: Secondary | ICD-10-CM | POA: Diagnosis present

## 2019-05-09 DIAGNOSIS — J189 Pneumonia, unspecified organism: Secondary | ICD-10-CM | POA: Diagnosis not present

## 2019-05-09 DIAGNOSIS — M545 Low back pain: Secondary | ICD-10-CM | POA: Diagnosis present

## 2019-05-09 DIAGNOSIS — J90 Pleural effusion, not elsewhere classified: Secondary | ICD-10-CM | POA: Diagnosis not present

## 2019-05-09 DIAGNOSIS — M5030 Other cervical disc degeneration, unspecified cervical region: Secondary | ICD-10-CM | POA: Diagnosis present

## 2019-05-09 DIAGNOSIS — H409 Unspecified glaucoma: Secondary | ICD-10-CM | POA: Diagnosis present

## 2019-05-09 DIAGNOSIS — D649 Anemia, unspecified: Secondary | ICD-10-CM

## 2019-05-09 DIAGNOSIS — R7989 Other specified abnormal findings of blood chemistry: Secondary | ICD-10-CM | POA: Diagnosis not present

## 2019-05-09 DIAGNOSIS — Z940 Kidney transplant status: Secondary | ICD-10-CM | POA: Diagnosis not present

## 2019-05-09 DIAGNOSIS — A6009 Herpesviral infection of other urogenital tract: Secondary | ICD-10-CM | POA: Diagnosis present

## 2019-05-09 DIAGNOSIS — H5461 Unqualified visual loss, right eye, normal vision left eye: Secondary | ICD-10-CM | POA: Diagnosis present

## 2019-05-09 HISTORY — DX: Personal history of COVID-19: Z86.16

## 2019-05-09 LAB — CBC
HCT: 21.6 % — ABNORMAL LOW (ref 36.0–46.0)
Hemoglobin: 6.7 g/dL — CL (ref 12.0–15.0)
MCH: 32.8 pg (ref 26.0–34.0)
MCHC: 31 g/dL (ref 30.0–36.0)
MCV: 105.9 fL — ABNORMAL HIGH (ref 80.0–100.0)
Platelets: 150 10*3/uL (ref 150–400)
RBC: 2.04 MIL/uL — ABNORMAL LOW (ref 3.87–5.11)
RDW: 23 % — ABNORMAL HIGH (ref 11.5–15.5)
WBC: 2 10*3/uL — ABNORMAL LOW (ref 4.0–10.5)
nRBC: 1 % — ABNORMAL HIGH (ref 0.0–0.2)

## 2019-05-09 LAB — URINALYSIS, COMPLETE (UACMP) WITH MICROSCOPIC
Bilirubin Urine: NEGATIVE
Glucose, UA: NEGATIVE mg/dL
Hgb urine dipstick: NEGATIVE
Ketones, ur: NEGATIVE mg/dL
Nitrite: NEGATIVE
Protein, ur: 30 mg/dL — AB
Specific Gravity, Urine: 1.013 (ref 1.005–1.030)
pH: 7 (ref 5.0–8.0)

## 2019-05-09 LAB — BILIRUBIN, FRACTIONATED(TOT/DIR/INDIR)
Bilirubin, Direct: 0.3 mg/dL — ABNORMAL HIGH (ref 0.0–0.2)
Indirect Bilirubin: 0.5 mg/dL (ref 0.3–0.9)
Total Bilirubin: 0.8 mg/dL (ref 0.3–1.2)

## 2019-05-09 LAB — GRAM STAIN

## 2019-05-09 LAB — POC OCCULT BLOOD, ED: Fecal Occult Bld: NEGATIVE

## 2019-05-09 LAB — IRON AND TIBC
Iron: 100 ug/dL (ref 28–170)
Saturation Ratios: 53 % — ABNORMAL HIGH (ref 10.4–31.8)
TIBC: 189 ug/dL — ABNORMAL LOW (ref 250–450)
UIBC: 89 ug/dL

## 2019-05-09 LAB — FERRITIN: Ferritin: 1744 ng/mL — ABNORMAL HIGH (ref 11–307)

## 2019-05-09 LAB — SARS CORONAVIRUS 2 (TAT 6-24 HRS): SARS Coronavirus 2: POSITIVE — AB

## 2019-05-09 LAB — D-DIMER, QUANTITATIVE: D-Dimer, Quant: 2.25 ug/mL-FEU — ABNORMAL HIGH (ref 0.00–0.50)

## 2019-05-09 LAB — PREPARE RBC (CROSSMATCH)

## 2019-05-09 LAB — FOLATE: Folate: 4.9 ng/mL — ABNORMAL LOW (ref >5.9)

## 2019-05-09 LAB — LACTATE DEHYDROGENASE: LDH: 241 U/L — ABNORMAL HIGH (ref 98–192)

## 2019-05-09 LAB — VITAMIN B12: Vitamin B-12: 702 pg/mL (ref 180–914)

## 2019-05-09 LAB — FIBRINOGEN: Fibrinogen: 485 mg/dL — ABNORMAL HIGH (ref 210–475)

## 2019-05-09 LAB — LACTIC ACID, PLASMA: Lactic Acid, Venous: 1.7 mmol/L (ref 0.5–1.9)

## 2019-05-09 LAB — PROCALCITONIN: Procalcitonin: 2.27 ng/mL

## 2019-05-09 LAB — C-REACTIVE PROTEIN: CRP: 5.7 mg/dL — ABNORMAL HIGH (ref <1.0)

## 2019-05-09 MED ORDER — BENZONATATE 100 MG PO CAPS
100.0000 mg | ORAL_CAPSULE | Freq: Three times a day (TID) | ORAL | Status: DC | PRN
  Administered 2019-05-09 – 2019-05-11 (×2): 100 mg via ORAL
  Filled 2019-05-09 (×2): qty 1

## 2019-05-09 MED ORDER — HYDROCORTISONE NA SUCCINATE PF 100 MG IJ SOLR
50.0000 mg | Freq: Four times a day (QID) | INTRAMUSCULAR | Status: DC
  Administered 2019-05-09 – 2019-05-11 (×7): 50 mg via INTRAVENOUS
  Filled 2019-05-09 (×7): qty 2

## 2019-05-09 MED ORDER — PANTOPRAZOLE SODIUM 40 MG PO TBEC
40.0000 mg | DELAYED_RELEASE_TABLET | Freq: Every day | ORAL | Status: DC
  Administered 2019-05-09 – 2019-05-18 (×9): 40 mg via ORAL
  Filled 2019-05-09 (×10): qty 1

## 2019-05-09 MED ORDER — SODIUM CHLORIDE 0.9 % IV SOLN
100.0000 mg | Freq: Every day | INTRAVENOUS | Status: AC
  Administered 2019-05-10 – 2019-05-13 (×4): 100 mg via INTRAVENOUS
  Filled 2019-05-09 (×4): qty 20

## 2019-05-09 MED ORDER — FOLIC ACID 1 MG PO TABS
1.0000 mg | ORAL_TABLET | Freq: Once | ORAL | Status: AC
  Administered 2019-05-09: 1 mg via ORAL
  Filled 2019-05-09: qty 1

## 2019-05-09 MED ORDER — DEXAMETHASONE SODIUM PHOSPHATE 10 MG/ML IJ SOLN: 6.0000 mg | Freq: Every day | INTRAMUSCULAR | Status: DC

## 2019-05-09 MED ORDER — FOLIC ACID 1 MG PO TABS
1.0000 mg | ORAL_TABLET | Freq: Every day | ORAL | Status: DC
  Administered 2019-05-10 – 2019-05-18 (×9): 1 mg via ORAL
  Filled 2019-05-09 (×9): qty 1

## 2019-05-09 MED ORDER — SODIUM CHLORIDE 0.9% IV SOLUTION: Freq: Once | INTRAVENOUS | Status: AC

## 2019-05-09 MED ORDER — ZINC SULFATE 220 (50 ZN) MG PO CAPS
220.0000 mg | ORAL_CAPSULE | Freq: Every day | ORAL | Status: DC
  Administered 2019-05-09 – 2019-05-18 (×10): 220 mg via ORAL
  Filled 2019-05-09 (×10): qty 1

## 2019-05-09 MED ORDER — TACROLIMUS 1 MG PO CAPS
2.0000 mg | ORAL_CAPSULE | Freq: Every evening | ORAL | Status: DC
  Administered 2019-05-09 – 2019-05-12 (×4): 2 mg via ORAL
  Filled 2019-05-09 (×7): qty 2

## 2019-05-09 MED ORDER — MIDODRINE HCL 5 MG PO TABS
5.0000 mg | ORAL_TABLET | Freq: Two times a day (BID) | ORAL | Status: DC
  Administered 2019-05-09 – 2019-05-18 (×18): 5 mg via ORAL
  Filled 2019-05-09 (×20): qty 1

## 2019-05-09 MED ORDER — ONDANSETRON HCL 4 MG/2ML IJ SOLN
4.0000 mg | Freq: Four times a day (QID) | INTRAMUSCULAR | Status: DC | PRN
  Administered 2019-05-09 – 2019-05-16 (×11): 4 mg via INTRAVENOUS
  Filled 2019-05-09 (×11): qty 2

## 2019-05-09 MED ORDER — SODIUM BICARBONATE-DEXTROSE 150-5 MEQ/L-% IV SOLN
150.0000 meq | INTRAVENOUS | Status: AC
  Administered 2019-05-09: 150 meq via INTRAVENOUS
  Filled 2019-05-09 (×2): qty 1000

## 2019-05-09 MED ORDER — ASCORBIC ACID 500 MG PO TABS
500.0000 mg | ORAL_TABLET | Freq: Every day | ORAL | Status: DC
  Administered 2019-05-09 – 2019-05-18 (×10): 500 mg via ORAL
  Filled 2019-05-09 (×10): qty 1

## 2019-05-09 MED ORDER — TACROLIMUS 1 MG PO CAPS
3.0000 mg | ORAL_CAPSULE | Freq: Every morning | ORAL | Status: DC
  Administered 2019-05-09 – 2019-05-13 (×5): 3 mg via ORAL
  Filled 2019-05-09 (×5): qty 3

## 2019-05-09 MED ORDER — SODIUM CHLORIDE 0.9 % IV BOLUS
500.0000 mL | Freq: Once | INTRAVENOUS | Status: AC
  Administered 2019-05-09: 500 mL via INTRAVENOUS

## 2019-05-09 MED ORDER — OXYCODONE HCL 5 MG PO TABS
5.0000 mg | ORAL_TABLET | Freq: Once | ORAL | Status: AC
  Administered 2019-05-09: 5 mg via ORAL
  Filled 2019-05-09: qty 1

## 2019-05-09 MED ORDER — ACETAMINOPHEN 325 MG PO TABS: 650.0000 mg | ORAL_TABLET | Freq: Four times a day (QID) | ORAL | Status: DC | PRN

## 2019-05-09 MED ORDER — AZATHIOPRINE 50 MG PO TABS
100.0000 mg | ORAL_TABLET | Freq: Every day | ORAL | Status: DC
  Administered 2019-05-09 – 2019-05-18 (×10): 100 mg via ORAL
  Filled 2019-05-09 (×10): qty 2

## 2019-05-09 MED ORDER — ONDANSETRON HCL 4 MG/2ML IJ SOLN
INTRAMUSCULAR | Status: AC
  Filled 2019-05-09: qty 2

## 2019-05-09 MED ORDER — OXYCODONE-ACETAMINOPHEN 5-325 MG PO TABS
2.0000 | ORAL_TABLET | Freq: Once | ORAL | Status: AC
  Administered 2019-05-09: 2 via ORAL
  Filled 2019-05-09: qty 2

## 2019-05-09 MED ORDER — SODIUM CHLORIDE 0.9 % IV SOLN
1.0000 g | Freq: Once | INTRAVENOUS | Status: AC
  Administered 2019-05-09: 1 g via INTRAVENOUS
  Filled 2019-05-09: qty 10

## 2019-05-09 MED ORDER — SODIUM CHLORIDE 0.9 % IV BOLUS: 500.0000 mL | Freq: Once | INTRAVENOUS | Status: DC

## 2019-05-09 MED ORDER — SODIUM CHLORIDE 0.9 % IV SOLN
100.0000 mg | INTRAVENOUS | Status: AC
  Administered 2019-05-09 (×2): 100 mg via INTRAVENOUS
  Filled 2019-05-09 (×4): qty 20

## 2019-05-09 MED ORDER — SODIUM CHLORIDE 0.9 % IV SOLN
500.0000 mg | Freq: Once | INTRAVENOUS | Status: AC
  Administered 2019-05-09: 500 mg via INTRAVENOUS
  Filled 2019-05-09: qty 500

## 2019-05-09 MED ORDER — ASPIRIN EC 81 MG PO TBEC
81.0000 mg | DELAYED_RELEASE_TABLET | Freq: Every day | ORAL | Status: DC
  Administered 2019-05-09 – 2019-05-18 (×10): 81 mg via ORAL
  Filled 2019-05-09 (×11): qty 1

## 2019-05-09 MED ORDER — LATANOPROST 0.005 % OP SOLN
1.0000 [drp] | Freq: Every day | OPHTHALMIC | Status: DC
  Administered 2019-05-09 – 2019-05-17 (×9): 1 [drp] via OPHTHALMIC
  Filled 2019-05-09: qty 2.5

## 2019-05-09 MED ORDER — HYDROCORTISONE NA SUCCINATE PF 100 MG IJ SOLR
50.0000 mg | Freq: Once | INTRAMUSCULAR | Status: AC
  Administered 2019-05-09: 50 mg via INTRAVENOUS
  Filled 2019-05-09: qty 2

## 2019-05-09 MED ORDER — HEPARIN SODIUM (PORCINE) 5000 UNIT/ML IJ SOLN
5000.0000 [IU] | Freq: Three times a day (TID) | INTRAMUSCULAR | Status: DC
  Administered 2019-05-09 – 2019-05-15 (×19): 5000 [IU] via SUBCUTANEOUS
  Filled 2019-05-09 (×21): qty 1

## 2019-05-09 MED ORDER — LIDOCAINE 5 % EX PTCH
1.0000 | MEDICATED_PATCH | Freq: Every day | CUTANEOUS | Status: DC | PRN
  Administered 2019-05-10: 1 via TRANSDERMAL
  Filled 2019-05-09: qty 1

## 2019-05-09 MED ORDER — GABAPENTIN 100 MG PO CAPS
100.0000 mg | ORAL_CAPSULE | Freq: Two times a day (BID) | ORAL | Status: DC
  Administered 2019-05-09 – 2019-05-18 (×18): 100 mg via ORAL
  Filled 2019-05-09 (×19): qty 1

## 2019-05-09 NOTE — ED Notes (Addendum)
Just prior to second dose of remdesivir completing, pt began to complain of burning at the site. Medication stopped. Mild edema and erythema with tenderness noted around site of IV. This Rn attempted to flush IV which would not flush. IV removed with catheter intact. Pharmacy contacted in regards to treatment for possible infiltration of medication, Pharmacist advised to elevate arm and apply ice.   pts L arm elevated, ice pack applied to site of removed IV.

## 2019-05-09 NOTE — ED Notes (Signed)
Phelbotomy was only able to obtain 1 set of blood cultures. Dr. Dallie Piles informed.

## 2019-05-09 NOTE — ED Notes (Signed)
Dr. Marlowe Sax informed pt. And family at bedside would like to speak to admitting provider. Questions regarding plan of care answered by RN. Pt concerning d/t ordering home medications.

## 2019-05-09 NOTE — ED Notes (Signed)
Discussed trending low BP with Dr. Marlowe Sax. MD to place orders

## 2019-05-09 NOTE — ED Notes (Signed)
Lunch tray set up at bedside. Upon this RN waking patient up to administer medications, patient asking when she was going to get pain meds through her IV. Pt advised that due to her low BP, no pain meds through her iv could be given right now. Pt back to sleep, prior to this RN exiting the room.

## 2019-05-09 NOTE — ED Provider Notes (Signed)
Assumed care from resident physician at shift change.  See prior notes for full H&P.  Briefly, 54 year old female sent in by PCP for blood transfusion due to hemoglobin of 6.7.  She is status post renal transplant and has recurrent stage III CKD and transplanted kidney, subsequently anemia secondary to this.  Initial plan was for admission for transfusion.  Resident spoke with triad who requested further work-up due to some hypotension.  Plan:  UA w/culture, lactate, blood cultures, and CXR pending.  Will consult medicine again if these are reassuring.  Results for orders placed or performed during the hospital encounter of 05/08/19  CBC  Result Value Ref Range   WBC 3.1 (L) 4.0 - 10.5 K/uL   RBC 2.29 (L) 3.87 - 5.11 MIL/uL   Hemoglobin 7.6 (L) 12.0 - 15.0 g/dL   HCT 23.8 (L) 36.0 - 46.0 %   MCV 103.9 (H) 80.0 - 100.0 fL   MCH 33.2 26.0 - 34.0 pg   MCHC 31.9 30.0 - 36.0 g/dL   RDW 23.4 (H) 11.5 - 15.5 %   Platelets 182 150 - 400 K/uL   nRBC 1.0 (H) 0.0 - 0.2 %  Basic metabolic panel  Result Value Ref Range   Sodium 135 135 - 145 mmol/L   Potassium 4.3 3.5 - 5.1 mmol/L   Chloride 107 98 - 111 mmol/L   CO2 15 (L) 22 - 32 mmol/L   Glucose, Bld 108 (H) 70 - 99 mg/dL   BUN 90 (H) 6 - 20 mg/dL   Creatinine, Ser 5.22 (H) 0.44 - 1.00 mg/dL   Calcium 8.8 (L) 8.9 - 10.3 mg/dL   GFR calc non Af Amer 9 (L) >60 mL/min   GFR calc Af Amer 10 (L) >60 mL/min   Anion gap 13 5 - 15  Folate  Result Value Ref Range   Folate 4.9 (L) >5.9 ng/mL  Reticulocytes  Result Value Ref Range   Retic Ct Pct 3.1 0.4 - 3.1 %   RBC. 2.04 (L) 3.87 - 5.11 MIL/uL   Retic Count, Absolute 62.8 19.0 - 186.0 K/uL   Immature Retic Fract 14.7 2.3 - 15.9 %  Vitamin B12  Result Value Ref Range   Vitamin B-12 702 180 - 914 pg/mL  Iron and TIBC  Result Value Ref Range   Iron 100 28 - 170 ug/dL   TIBC 189 (L) 250 - 450 ug/dL   Saturation Ratios 53 (H) 10.4 - 31.8 %   UIBC 89 ug/dL  Ferritin  Result Value Ref Range    Ferritin 1,744 (H) 11 - 307 ng/mL  Lactic acid, plasma  Result Value Ref Range   Lactic Acid, Venous 1.7 0.5 - 1.9 mmol/L  POC occult blood, ED RN will collect  Result Value Ref Range   Fecal Occult Bld NEGATIVE NEGATIVE  Type and screen [place]  Result Value Ref Range   ABO/RH(D) B POS    Antibody Screen NEG    Sample Expiration 05/11/2019,2359    Unit Number [id number]    Blood Component Type RBC, LR IRR    Unit division 00    Status of Unit ISSUED    Transfusion Status OK TO TRANSFUSE    Crossmatch Result      Compatible Performed at [facility], [address] [place], [place] [address]   Prepare RBC (crossmatch)  Result Value Ref Range   Order Confirmation      ORDER PROCESSED BY BLOOD BANK Performed at [facility], [place] [address] [place], [place] [address]   BPAM [facility]  Result Value Ref Range   ISSUE DATE / TIME [id number]    Blood Product Unit Number [id number]    PRODUCT CODE [id number]    Unit Type and Rh 7300    Blood Product Expiration Date 468032122482    DG Chest 2 View  Result Date: 04/17/2019 CLINICAL DATA:  Dyspnea and weakness. EXAM: CHEST - 2 VIEW COMPARISON:  March 20, 2019. FINDINGS: There are stable mild cardiomegaly and superior vena cava stent graft placement. Increased interstitial opacity projects on the bilateral infrahilar regions with mild bronchial wall thickening, but without obscuration of the diaphragm or heart borders. There is persistent interstitial edema. There is no pneumothorax or obvious pleural effusion. Aortic knob calcified atherosclerosis and postsurgical change of lower cervical ACDF are present. Surgical clips project on the upper abdomen. IMPRESSION: Increased bilateral infrahilar bronchitis with nonspecific small airways disease which may be inflammatory, infectious or aspiration-related. No pneumonia. Stable cardiomegaly and postoperative changes with subtle pulmonary edema but no  obvious pleural effusion. Electronically Signed   By: Revonda Humphrey   On: 04/17/2019 15:09   DG Chest Portable 1 View  Result Date: 05/09/2019 CLINICAL DATA:  Hypotension. EXAM: PORTABLE CHEST 1 VIEW COMPARISON:  05/18/2019 FINDINGS: Cardiac silhouette is stable. Again noted is an SVC stent. There is vascular congestion without overt pulmonary edema. There is a new dense airspace opacity at the left lung base concerning for pneumonia or aspiration. There is no pneumothorax. No significant pleural effusion. No acute osseous abnormality. IMPRESSION: Findings concerning for developing left lower lobe pneumonia in the appropriate clinical setting. Electronically Signed   By: Constance Holster M.D.   On: 05/09/2019 00:13    Patient with normal lactate.  Chest x-ray is concerning for left lower lobe pneumonia.  Patient does report cough, however this has been ongoing for months-- states it may be slightly worse lately.  Remains afebrile and non-toxic here.  BP remains in the upper 80's to low 50'I systolic, however based on chart review and recent OP visits this appears to be around her baseline.  Transfusion running now, no issues observed.  Feel it would be reasonable to cover with dose of antibiotics as she is on immune suppressants from kidney transplant.  Patient is agreeable.  Will re-consult medical team for admission.  Discussed with hospitalist, Dr. Marlowe Sax-- will admit for ongoing care.   Larene Pickett, PA-C 05/09/19 0159    Merryl Hacker, MD 05/09/19 [phone]

## 2019-05-09 NOTE — ED Notes (Signed)
Dr. Jacinta Shoe returned this RN's page regarding patient's anti rejection medications and lasix that she takes at home. Dr. Jacinta Shoe advised she would be coming down to see the patient soon and would discuss her medications with her.

## 2019-05-09 NOTE — Consult Note (Signed)
[place]  Reason for Consultation: AKI on CKD s/p renal transplant  Requesting Provider: Dr. Fanny Bien  HPI: Tina Watkins is an 54 y.o. 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    Pt was seen for follow up in cardiology yesterday.  Labs showed worsening renal function and anemia and she  was presented to ED last night for evaluation. CP, cough, fatigue, low grade fever at home.  BP in the 80s.   Found to be covid +.  CXR with LLL PNA.  Being treated with medrol and remdesivir, CTX, azithromycin.  1.5 L NS bolus.  Endorses very poor po intake lately and feeling dehydrated.  Asking for lasix because she hasn't urinated.   03/2019 hb 10.1 > 3/15 8.3  > 4.5 6.7; WBC 2s, Plt 130-150s; 1u pRBC given in ED. Recent admission 03/2019 for CHF -- AKI with diuresis peaking at 4.  F/u outpt Cr 3.8 on 03/27/19.  Yesterday BUN 89, Cr 4.6; today BUN 90, Cr 5.2.   UA today 1.013, 7, + protein, small LE, neg blood neg nit.  Prograf 3/2, azathioprine 100 daily, prednisone 5 daily.  PMH: Past Medical History:  Diagnosis Date  . Bacteremia due to Gram-negative bacteria 05/23/2011  . Blind    right eye  . Blind right eye   . CHF (congestive heart failure) ([place])   . Chronic lower back pain   . Complication of anesthesia    "woke up during OR; I have an extremely high tolerance" (12/11/2011) 1 procedure was graft; the other procedures were procedures that are typically done with sedation.  . DDD (degenerative disc disease), cervical   . Depression   . Dysrhythmia    "tachycardia" (12/11/2011) new onset afib 10/15/14 EKG  . E coli bacteremia 06/18/2011  . Elevated LDL cholesterol level 12/2018  . ESRD (end stage renal disease) ([place]) 06/12/2011  . Fibromyalgia   . Gastroparesis   . Gastroparesis   . GERD (gastroesophageal reflux disease)    . Glaucoma    right eye  . Gout   . H/O carpal tunnel syndrome   . Headache(784.0)    "not often anymore" (12/11/2011)  . Herpes genitalia 1994  . History of blood transfusion    "more than a few times" (12/11/2011)  . History of stomach ulcers   . Hypotension   . Iron deficiency anemia   . New onset a-fib ([place])    10/15/14 EKG  . Osteopenia   . Peripheral neuropathy 11/2018  . Seizures ([place]) 1994   "post transplant; only have had that one" (12/11/2011)  . Spinal stenosis in cervical region   . Stroke [facility])     left basal ganglia lacunar infarct; Right frontal lobe lacunar infarct.  . Stroke [facility]) ~ 1999; 2001   "briefly lost my vision; lost my right eye" (12/11/2011)  . Vitamin D deficiency 12/2018   PSH: Past Surgical History:  Procedure Laterality Date  . ANTERIOR CERVICAL DECOMP/DISCECTOMY FUSION N/A 01/08/2015   Procedure: Anterior Cervical Three-Four/Four-Five Decompression/Diskectomy/Fusion;  Surgeon: Leeroy Cha, MD;  Location: [facility];  Service: Neurosurgery;  Laterality: N/A;  C3-4 C4-5 Anterior cervical decompression/diskectomy/fusion  . APPENDECTOMY  ~ 2004  . CATARACT EXTRACTION     right eye  . COLONOSCOPY    . ENUCLEATION  2001   "right"  . ESOPHAGOGASTRODUODENOSCOPY (EGD) WITH PROPOFOL  N/A 04/21/2012   Procedure: ESOPHAGOGASTRODUODENOSCOPY (EGD) WITH PROPOFOL;  Surgeon: Milus Banister, MD;  Location: [facility] ENDOSCOPY;  Service: Endoscopy;  Laterality: N/A;  . 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 [place]; 1999; 2005   "right"  . MULTIPLE TOOTH EXTRACTIONS    . RIGHT HEART CATH N/A 03/23/2019   Procedure: RIGHT HEART CATH;  Surgeon: Jolaine Artist, MD;  Location: [facility] CV LAB;  Service: Cardiovascular;  Laterality: N/A;  . TONSILLECTOMY    . TOTAL NEPHRECTOMY  1988?; 1994; 2005    Past Medical History:  Diagnosis Date  . Bacteremia due to Gram-negative bacteria  05/23/2011  . Blind    right eye  . Blind right eye   . CHF (congestive heart failure) ([place])   . Chronic lower back pain   . Complication of anesthesia    "woke up during OR; I have an extremely high tolerance" (12/11/2011) 1 procedure was graft; the other procedures were procedures that are typically done with sedation.  . DDD (degenerative disc disease), cervical   . Depression   . Dysrhythmia    "tachycardia" (12/11/2011) new onset afib 10/15/14 EKG  . E coli bacteremia 06/18/2011  . Elevated LDL cholesterol level 12/2018  . ESRD (end stage renal disease) ([place]) 06/12/2011  . Fibromyalgia   . Gastroparesis   . Gastroparesis   . GERD (gastroesophageal reflux disease)   . Glaucoma    right eye  . Gout   . H/O carpal tunnel syndrome   . Headache(784.0)    "not often anymore" (12/11/2011)  . Herpes genitalia 1994  . History of blood transfusion    "more than a few times" (12/11/2011)  . History of stomach ulcers   . Hypotension   . Iron deficiency anemia   . New onset a-fib ([place])    10/15/14 EKG  . Osteopenia   . Peripheral neuropathy 11/2018  . Seizures ([place]) 1994   "post transplant; only have had that one" (12/11/2011)  . Spinal stenosis in cervical region   . Stroke [facility])     left basal ganglia lacunar infarct; Right frontal lobe lacunar infarct.  . Stroke [facility]) ~ 1999; 2001   "briefly lost my vision; lost my right eye" (12/11/2011)  . Vitamin D deficiency 12/2018    Medications:  I have reviewed the patient's current medications.  (Not in a hospital admission)   ALLERGIES:   Allergies  Allergen Reactions  . Levofloxacin Itching and Rash  . Tape Other (See Comments)    "Certain surgical tapes peel off my skin"    FAM HX: Family History  Problem Relation Age of Onset  . Glaucoma Mother   . Pancreatic cancer Father   . Multiple sclerosis Brother   . Hypertension Maternal Grandmother   . Breast cancer Neg Hx     Social History:   reports that she has been  smoking cigarettes. She has a 5.60 pack-year smoking history. She has never used smokeless tobacco. She reports current alcohol use. She reports current drug use. Drug: Marijuana.  ROS: 12 system ROS neg except per HPI  Blood pressure (!) 100/53, pulse (!) 51, temperature 97.8 F (36.6 C), resp. rate 18, height 4\' 11"  (1.499 m), weight 52.3 kg, SpO2 98 %. PHYSICAL EXAM: Gen: ill appearing lying on R side on stretcher  Eyes: anicteric, glasses, EOMI ENT: tacky MM Neck: no JVD CV: brady,  regular, no rub Abd:  Soft, nontender, NABS Lungs: frequent coughing, normal WOB, rhonchi scattered BL GU: no foley Extr: very trace chronic ankle edema Neuro: nonfocal Skin: warm and dry   Results for orders placed or performed during the hospital encounter of 05/08/19 (from the past 48 hour(s))  Reticulocytes     Status: Abnormal   Collection Time: 05/08/19  9:12 PM  Result Value Ref Range   Retic Ct Pct 3.1 0.4 - 3.1 %   RBC. 2.04 (L) 3.87 - 5.11 MIL/uL   Retic Count, Absolute 62.8 19.0 - 186.0 K/uL   Immature Retic Fract 14.7 2.3 - 15.9 %    Comment: Performed at [facility], [address], [place], [place] [address]  Type and screen [place]     Status: None (Preliminary result)   Collection Time: 05/08/19  9:33 PM  Result Value Ref Range   ABO/RH(D) B POS    Antibody Screen NEG    Sample Expiration 05/11/2019,2359    Unit Number [id number]    Blood Component Type RBC, LR IRR    Unit division 00    Status of Unit ISSUED    Transfusion Status OK TO TRANSFUSE    Crossmatch Result Compatible    Unit Number [id number]    Blood Component Type RBC, LR IRR    Unit division 00    Status of Unit ISSUED    Transfusion Status OK TO TRANSFUSE    Crossmatch Result      Compatible Performed at [facility], [place] [address], [place], [place] [address]   CBC     Status: Abnormal   Collection Time: 05/08/19  9:33 PM  Result Value Ref Range   WBC 3.1 (L) 4.0  - 10.5 K/uL   RBC 2.29 (L) 3.87 - 5.11 MIL/uL   Hemoglobin 7.6 (L) 12.0 - 15.0 g/dL   HCT 23.8 (L) 36.0 - 46.0 %   MCV 103.9 (H) 80.0 - 100.0 fL   MCH 33.2 26.0 - 34.0 pg   MCHC 31.9 30.0 - 36.0 g/dL   RDW 23.4 (H) 11.5 - 15.5 %   Platelets 182 150 - 400 K/uL    Comment: Immature Platelet Fraction may be clinically indicated, consider ordering this additional test [id number]    nRBC 1.0 (H) 0.0 - 0.2 %    Comment: Performed at [facility], [place] [address], [place], [place] [address]  Basic metabolic panel     Status: Abnormal   Collection Time: 05/08/19  9:33 PM  Result Value Ref Range   Sodium 135 135 - 145 mmol/L   Potassium 4.3 3.5 - 5.1 mmol/L   Chloride 107 98 - 111 mmol/L   CO2 15 (L) 22 - 32 mmol/L   Glucose, Bld 108 (H) 70 - 99 mg/dL    Comment: Glucose reference range applies only to samples taken after fasting for at least 8 hours.   BUN 90 (H) 6 - 20 mg/dL   Creatinine, Ser 5.22 (H) 0.44 - 1.00 mg/dL   Calcium 8.8 (L) 8.9 - 10.3 mg/dL   GFR calc non Af Amer 9 (L) >60 mL/min   GFR calc Af Amer 10 (L) >60 mL/min   Anion gap 13 5 - 15    Comment: Performed at [facility] [address] [place], [place] [address]  Prepare RBC (crossmatch)     Status: None   Collection Time: 05/08/19  9:53 PM  Result Value  Ref Range   Order Confirmation      ORDER PROCESSED BY BLOOD BANK Performed at [facility], [place] [address], [place], [place] [address]   Folate     Status: Abnormal   Collection Time: 05/08/19 10:00 PM  Result Value Ref Range   Folate 4.9 (L) >5.9 ng/mL    Comment: Performed at [facility], [place] [address] [place], [place] [address]  Vitamin B12     Status: None   Collection Time: 05/08/19 10:00 PM  Result Value Ref Range   Vitamin B-12 702 180 - 914 pg/mL    Comment: (NOTE) This assay is not validated for testing neonatal or myeloproliferative syndrome specimens for Vitamin B12 levels. Performed at [facility], [place]  [address], [place], [place] [address]   Iron and TIBC     Status: Abnormal   Collection Time: 05/08/19 10:00 PM  Result Value Ref Range   Iron 100 28 - 170 ug/dL   TIBC 189 (L) 250 - 450 ug/dL   Saturation Ratios 53 (H) 10.4 - 31.8 %   UIBC 89 ug/dL    Comment: Performed at [facility] [address] [place], [place] [address]  Ferritin     Status: Abnormal   Collection Time: 05/08/19 10:00 PM  Result Value Ref Range   Ferritin 1,744 (H) 11 - 307 ng/mL    Comment: Performed at [facility], [place] [address] [place], [place] [address]  POC occult blood, ED RN will collect     Status: None   Collection Time: 05/09/19 12:03 AM  Result Value Ref Range   Fecal Occult Bld NEGATIVE NEGATIVE  Lactic acid, plasma     Status: None   Collection Time: 05/09/19 12:21 AM  Result Value Ref Range   Lactic Acid, Venous 1.7 0.5 - 1.9 mmol/L    Comment: Performed at [facility] [address], [place], [place] [address]  SARS CORONAVIRUS 2 (TAT 6-24 HRS) Nasopharyngeal Nasopharyngeal Swab     Status: Abnormal   Collection Time: 05/09/19 12:25 AM   Specimen: Nasopharyngeal Swab  Result Value Ref Range   SARS Coronavirus 2 POSITIVE (A) NEGATIVE    Comment: RESULT CALLED TO, READ BACK BY AND VERIFIED WITH: RN OSORIO BAILEY AT 0620 ON 05/09/2019 BY MESSAN HOUEGNIFIO (NOTE) SARS-CoV-2 target nucleic acids are DETECTED. The SARS-CoV-2 RNA is generally detectable in upper and lower respiratory specimens during the acute phase of infection. Positive results are indicative of the presence of SARS-CoV-2 RNA. Clinical correlation with patient history and other diagnostic information is  necessary to determine patient infection status. Positive results do not rule out bacterial infection or co-infection with other viruses.  The expected result is Negative. Fact Sheet for Patients: SugarRoll.be Fact Sheet for Healthcare  Providers: https://www.woods-mathews.com/ This test is not yet approved or cleared by the [place] FDA and  has been authorized for detection and/or diagnosis of SARS-CoV-2 by FDA under an Emergency Use Authorization (EUA). This EUA will remain  in effect (meaning this tes t can be used) for the duration of the COVID-19 declaration under Section 564(b)(1) of the Act, 21 U.S.C. section 360bbb-3(b)(1), unless the authorization is terminated or revoked sooner. Performed at [facility], [place] [address] [place],  [address]   Urinalysis, Complete w Microscopic     Status: Abnormal   Collection Time: 05/09/19  5:33 AM  Result Value Ref Range   Color, Urine YELLOW  YELLOW   APPearance HAZY (A) CLEAR   Specific Gravity, Urine 1.013 1.005 - 1.030   pH 7.0 5.0 - 8.0   Glucose, UA NEGATIVE NEGATIVE mg/dL   Hgb urine dipstick NEGATIVE NEGATIVE   Bilirubin Urine NEGATIVE NEGATIVE   Ketones, ur NEGATIVE NEGATIVE mg/dL   Protein, ur 30 (A) NEGATIVE mg/dL   Nitrite NEGATIVE NEGATIVE   Leukocytes,Ua SMALL (A) NEGATIVE   RBC / HPF 0-5 0 - 5 RBC/hpf   WBC, UA 0-5 0 - 5 WBC/hpf   Bacteria, UA FEW (A) NONE SEEN   Squamous Epithelial / LPF 21-50 0 - 5   Amorphous Crystal PRESENT     Comment: Performed at [facility], [place] [address], [place] [address]  CBC     Status: Abnormal   Collection Time: 05/09/19  6:18 AM  Result Value Ref Range   WBC 2.0 (L) 4.0 - 10.5 K/uL   RBC 2.04 (L) 3.87 - 5.11 MIL/uL   Hemoglobin 6.7 (LL) 12.0 - 15.0 g/dL    Comment: REPEATED TO VERIFY THIS CRITICAL RESULT HAS VERIFIED AND BEEN CALLED TO CHELSEA CARLIN,RN BY ZELDA BEECH ON 04 06 2021 AT 0739, AND HAS BEEN READ BACK.     HCT 21.6 (L) 36.0 - 46.0 %   MCV 105.9 (H) 80.0 - 100.0 fL   MCH 32.8 26.0 - 34.0 pg   MCHC 31.0 30.0 - 36.0 g/dL   RDW 23.0 (H) 11.5 - 15.5 %   Platelets 150 150 - 400 K/uL   nRBC 1.0 (H) 0.0 - 0.2 %    Comment: Performed at [facility] [address] [place], [place] [address]  Prepare RBC (crossmatch)     Status: None   Collection Time: 05/09/19  7:49 AM  Result Value Ref Range   Order Confirmation      ORDER PROCESSED BY BLOOD BANK Performed at [facility], [place] [address] [place], [place] [address]   Fibrinogen     Status: Abnormal   Collection Time: 05/09/19  7:51 AM  Result Value Ref Range   Fibrinogen 485 (H) 210 - 475 mg/dL    Comment: Performed at [facility] [address], [place], [place] [address]  D-dimer, quantitative (not at [facility])     Status: Abnormal   Collection Time: 05/09/19  7:51 AM  Result Value Ref Range   D-Dimer, Quant 2.25 (H) 0.00 - 0.50 ug/mL-FEU    Comment: (NOTE) At the manufacturer cut-off of 0.50 ug/mL FEU, this assay has been documented to exclude PE with a sensitivity and negative predictive value of 97 to 99%.  At this time, this assay has not been approved by the FDA to exclude DVT/VTE. Results should be correlated with clinical presentation. Performed at [facility], [place] [address] [place],  [address]   Procalcitonin     Status: None   Collection Time: 05/09/19  7:51 AM  Result Value Ref Range   Procalcitonin 2.27 ng/mL    Comment:        Interpretation: PCT > 2 ng/mL: Systemic infection (sepsis) is likely, unless other causes are known. (NOTE)       Sepsis PCT Algorithm           Lower Respiratory Tract  Infection PCT Algorithm    ----------------------------     ----------------------------         PCT < 0.25 ng/mL                PCT < 0.10 ng/mL         Strongly encourage             Strongly discourage   discontinuation of antibiotics    initiation of antibiotics    ----------------------------     -----------------------------       PCT 0.25 - 0.50 ng/mL            PCT 0.10 - 0.25 ng/mL               OR       >80% decrease in PCT            Discourage initiation of                                             antibiotics      Encourage discontinuation           of antibiotics    ----------------------------     -----------------------------         PCT >= 0.50 ng/mL              PCT 0.26 - 0.50 ng/mL               AND       <80% decrease in PCT              Encourage initiation of                                             antibiotics       Encourage continuation           of antibiotics    ----------------------------     -----------------------------        PCT >= 0.50 ng/mL                  PCT > 0.50 ng/mL               AND         increase in PCT                  Strongly encourage                                      initiation of antibiotics    Strongly encourage escalation           of antibiotics                                     -----------------------------                                           PCT <= 0.25 ng/mL  OR                                        > 80% decrease in PCT                                     Discontinue / Do not initiate                                             antibiotics Performed at [facility], [place] [address], [place], [place] [address]   Lactate dehydrogenase     Status: Abnormal   Collection Time: 05/09/19  7:51 AM  Result Value Ref Range   LDH 241 (H) 98 - 192 U/L    Comment: Performed at [facility], [place] [address], [place], [place] [address]  C-reactive protein     Status: Abnormal   Collection Time: 05/09/19  7:51 AM  Result Value Ref Range   CRP 5.7 (H) <1.0 mg/dL    Comment: Performed at [facility] [address] [place], [place] [address]    DG Chest Portable 1 View  Result Date: 05/09/2019 CLINICAL DATA:  Hypotension. EXAM: PORTABLE CHEST 1 VIEW COMPARISON:  05/18/2019 FINDINGS: Cardiac silhouette is stable. Again noted is an SVC stent. There is vascular congestion without overt pulmonary edema. There is a new dense airspace opacity at the left lung  base concerning for pneumonia or aspiration. There is no pneumothorax. No significant pleural effusion. No acute osseous abnormality. IMPRESSION: Findings concerning for developing left lower lobe pneumonia in the appropriate clinical setting. Electronically Signed   By: Constance Holster M.D.   On: 05/09/2019 00:13    Assessment/Plan **COVID 19 PNA: steroids, remdesivir, antibiotics per primary.   **ESRD s/p renal transplant x3:  Worsening renal function in setting of volume depletion, hypotension, COVID acute illness. Continue immunosuppression for now (resume prednisone when medrol for covid done).  Check tac trough tomorrow am.  Hold diuretic for now in setting of volume depletion; gentle hydration with Na bicarb 75/hr x 12hrs.  Follow for dialysis needs.  Does not have dialysis access.   **Anemia of CKD:  S/p 1u pRBC yesterday, Hb in 7s.  Procrit 50,000 q2wks recently initiated at short stay last dose 04/28/19.  Consider giving aranesp here. Iron replete 05/08/19. On folate supplement. May need another unit of pRBC if < 7.   **HFpEF:  Holding diuretic with concern for volume depletion, volume expand with na bicarb 75/hr x 12 hrs.  Low threshhold to resume on background of recent admit for volume overload.    **Hypotension:  BP at clinic visit 03/2019 90/40, currently about at baseline.  On midodrine inpatient.  Rec'd volume expansion yesterday.  Consider stress dose steroids if not responding given chronic prednisone use.   **Metabolic acidosis:  Bicarb gtt 75/hr x 12 hrs.   Justin Mend 05/09/2019, 11:29 AM

## 2019-05-09 NOTE — H&P (Signed)
History and Physical    Chole Driver SEG:[id number] DOB: 02/02/1966 DOA: 05/08/2019  PCP: [name], FNP  Chief Complaint: Anemia, hypotension  HPI: Tina Watkins is a 54 y.o. female with medical history significant of ESRD status post renal transplant complicated by CKD stage IV on chronic steroids and immunosuppressive therapy, hypertension, TIA, chronic diastolic congestive heart failure sent to the ED by her cardiologist/PCP for evaluation of anemia (hemoglobin 6.7 down from 7.9 on 3/26), fatigue, worsening renal function, and hypotension. Patient states she has not been feeling well for the past 3 weeks since her prior ED visit.  She is upset that she was not treated appropriately.  States she has had ongoing left-sided pressure-like chest pain, cough, and shortness of breath for the past 3 weeks.  She had a low-grade fever at home yesterday.  Denies hematemesis, hematochezia, or melena.  States she has had blood transfusions previously and never had any reaction.  States today she felt nauseous during her transfusion as she has not eaten anything all day and was given oxycodone on an empty stomach.  She does not feel that her nausea was related to the blood transfusion.  She did not have any other reaction at the time of the transfusion.  ED Course: Afebrile.  Not tachycardic.  Hypotensive with systolic as low as 73X.  Not hypoxic.  WBC count 3.1, leukopenic on prior labs as well.  Hemoglobin 7.6, was in the 7-8 range on recent labs.  FOBT negative.  Potassium 4.3.  Bicarb 15, was low on labs done 3 weeks ago as well.  BUN 90, creatinine 5.2.  Creatinine was previously ranging from 3.5-3.7 on labs done a month ago.  Folate level 4.9.  B12 normal.  Iron saturation above normal range.  TIBC low.  Ferritin elevated at 1744.  UA and urine culture pending.  Blood culture x2 pending.  Lactic acid normal.  SARS-CoV-2 PCR test pending. Chest x-ray showing findings concerning  for developing left lower lobe pneumonia.  Patient received folic acid, oxycodone, ceftriaxone, azithromycin, and 1 L fluid bolus.  1 unit PRBCs ordered.  After receiving 250 cc of PRBCs patient complained of nausea and transfusion was held.  No other signs of acute transfusion reaction.  Review of Systems:  All systems reviewed and apart from history of presenting illness, are negative.  Past Medical History:  Diagnosis Date  . Bacteremia due to Gram-negative bacteria 05/23/2011  . Blind    right eye  . Blind right eye   . CHF (congestive heart failure) ([place])   . Chronic lower back pain   . Complication of anesthesia    "woke up during OR; I have an extremely high tolerance" (12/11/2011) 1 procedure was graft; the other procedures were procedures that are typically done with sedation.  . DDD (degenerative disc disease), cervical   . Depression   . Dysrhythmia    "tachycardia" (12/11/2011) new onset afib 10/15/14 EKG  . E coli bacteremia 06/18/2011  . Elevated LDL cholesterol level 12/2018  . ESRD (end stage renal disease) ([place]) 06/12/2011  . Fibromyalgia   . Gastroparesis   . Gastroparesis   . GERD (gastroesophageal reflux disease)   . Glaucoma    right eye  . Gout   . H/O carpal tunnel syndrome   . Headache(784.0)    "not often anymore" (12/11/2011)  . Herpes genitalia 1994  . History of blood transfusion    "more than a few times" (12/11/2011)  . History of  stomach ulcers   . Hypotension   . Iron deficiency anemia   . New onset a-fib ([place])    10/15/14 EKG  . Osteopenia   . Peripheral neuropathy 11/2018  . Seizures ([place]) 1994   "post transplant; only have had that one" (12/11/2011)  . Spinal stenosis in cervical region   . Stroke [facility])     left basal ganglia lacunar infarct; Right frontal lobe lacunar infarct.  . Stroke [facility]) ~ 1999; 2001   "briefly lost my vision; lost my right eye" (12/11/2011)  . Vitamin D deficiency 12/2018    Past Surgical History:  Procedure  Laterality Date  . ANTERIOR CERVICAL DECOMP/DISCECTOMY FUSION N/A 01/08/2015   Procedure: Anterior Cervical Three-Four/Four-Five Decompression/Diskectomy/Fusion;  Surgeon: Leeroy Cha, MD;  Location: [facility];  Service: Neurosurgery;  Laterality: N/A;  C3-4 C4-5 Anterior cervical decompression/diskectomy/fusion  . APPENDECTOMY  ~ 2004  . CATARACT EXTRACTION     right eye  . COLONOSCOPY    . ENUCLEATION  2001   "right"  . ESOPHAGOGASTRODUODENOSCOPY (EGD) WITH PROPOFOL N/A 04/21/2012   Procedure: ESOPHAGOGASTRODUODENOSCOPY (EGD) WITH PROPOFOL;  Surgeon: Milus Banister, MD;  Location: [facility];  Service: Endoscopy;  Laterality: N/A;  . 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 [place]; 1999; 2005   "right"  . MULTIPLE TOOTH EXTRACTIONS    . RIGHT HEART CATH N/A 03/23/2019   Procedure: RIGHT HEART CATH;  Surgeon: Jolaine Artist, MD;  Location: [facility];  Service: Cardiovascular;  Laterality: N/A;  . TONSILLECTOMY    . [place]?; 1994; 2005     reports that she has been smoking cigarettes. She has a 5.60 pack-year smoking history. She has never used smokeless tobacco. She reports current alcohol use. She reports current drug use. Drug: Marijuana.  Allergies  Allergen Reactions  . Levofloxacin Itching and Rash  . Tape Other (See Comments)    "Certain surgical tapes peel off my skin"    Family History  Problem Relation Age of Onset  . Glaucoma Mother   . Pancreatic cancer Father   . Multiple sclerosis Brother   . Hypertension Maternal Grandmother   . Breast cancer Neg Hx     Prior to Admission medications   Medication Sig Start Date End Date Taking? Authorizing Provider  aspirin EC 81 MG tablet Take 81 mg by mouth daily.    Yes [provider]  azaTHIOprine (IMURAN) 50 MG tablet Take 100 mg by mouth daily.    Yes [provider]  colchicine 0.6 MG  tablet Take 0.6-1.2 mg by mouth See admin instructions. Take 1.2 mg by mouth at onset, then take 0.6 mg three times a day as need for gout flare   Yes [provider]  fluticasone (FLONASE) 50 MCG/ACT nasal spray Place 1 spray into both nostrils daily as needed for allergies or rhinitis.   Yes [provider]  furosemide (LASIX) 40 MG tablet Take 80 mg by mouth daily.   Yes [provider]  gabapentin (NEURONTIN) 100 MG capsule Take 100 mg by mouth 2 (two) times daily.  09/27/18  Yes [provider]  latanoprost (XALATAN) 0.005 % ophthalmic solution Place 1 drop into the left eye at bedtime. 03/30/17  Yes [provider]  lidocaine (LIDODERM) 5 % Place 1 patch onto the skin daily as needed (for pain- remove old patch first).  10/28/18  Yes [provider]  metoCLOPramide (REGLAN) 5 MG tablet Take 1 tablet (5 mg total) by mouth 2 (two) times daily. 04/04/19  Yes Milus Banister, MD  nystatin (MYCOSTATIN) 100000 UNIT/ML suspension Use as directed 5 mLs in the mouth or throat daily as needed (FOR THRUSH).    Yes [provider]  omeprazole (PRILOSEC) 40 MG capsule Take 40 mg by mouth 2 (two) times daily.   Yes [provider]  ondansetron (ZOFRAN-ODT) 4 MG disintegrating tablet Take 4 mg by mouth every 8 (eight) hours as needed for nausea or vomiting.   Yes [provider]  oxyCODONE-acetaminophen (PERCOCET) 10-325 MG tablet Take 1 tablet by mouth See admin instructions. Take 1 tablet by mouth every four to six hours as needed for pain   Yes [provider]  predniSONE (DELTASONE) 5 MG tablet Take 5 mg by mouth daily. 06/22/11  Yes [name], MD  PROVENTIL HFA 108 [id number] Base) MCG/ACT inhaler Inhale 2 puffs into the lungs 4 (four) times daily as needed for shortness of breath. 08/04/17  Yes [provider]  tacrolimus (PROGRAF) 1 MG capsule Take 2-3 mg by mouth See admin instructions. Take 3 mg by mouth in  the morning and 2 mg at bedtime 06/22/11  Yes [name], MD  Vitamin D, Ergocalciferol, (DRISDOL) 1.25 MG (50000 UNIT) CAPS capsule Take 50,000 Units by mouth every Sunday.    Yes [provider]  zolpidem (AMBIEN) 10 MG tablet Take 10 mg by mouth at bedtime as needed for sleep.   Yes [provider]  Darbepoetin Alfa (ARANESP, ALBUMIN FREE, IJ) Inject as directed every 14 (fourteen) days.    [provider]  furosemide (LASIX) 80 MG tablet Take 1 tablet (80 mg total) by mouth daily. Patient not taking: Reported on 05/09/2019 04/19/19   Bensimhon, Shaune Pascal, MD  metolazone (ZAROXOLYN) 2.5 MG tablet Take 1 tablet (2.5 mg total) by mouth once as needed for up to 1 dose (once a week if needed for weight gain). On Monday and Friday Patient not taking: Reported on 05/09/2019 03/24/19   Domenic Polite, MD    Physical Exam: Vitals:   05/09/19 0515 05/09/19 0545 05/09/19 0600 05/09/19 0630  BP: (!) 86/58  (!) 85/57 (!) 88/53  Pulse:  (!) 50 (!) 50 (!) 51  Resp:  18 14 15   Temp:      TempSrc:      SpO2:  99% 99% 99%  Weight:      Height:        Physical Exam  Constitutional: She is oriented to person, place, and time. No distress.  Lethargic  HENT:  Head: Normocephalic.  Eyes: Right eye exhibits no discharge. Left eye exhibits no discharge.  Cardiovascular: Normal rate, regular rhythm and intact distal pulses.  Pulmonary/Chest: Effort normal and breath sounds normal. No respiratory distress. She has no wheezes. She has no rales.  Abdominal: Soft. Bowel sounds are normal. There is no abdominal tenderness. There is no guarding.  Musculoskeletal:        General: No edema.     Cervical back: Neck supple.  Neurological: She is alert and oriented to person, place, and time.  Skin: Skin is warm and dry. She is not diaphoretic.   Labs on Admission: I have personally reviewed following labs and imaging studies  CBC: Recent Labs  Lab 05/08/19 0854 05/08/19 2133   WBC 2.9* 3.1*  HGB 6.7* 7.6*  HCT 19.5* 23.8*  MCV 98* 103.9*  PLT 170 841   Basic Metabolic Panel: Recent Labs  Lab 05/08/19 0854 05/08/19 2133  NA 138 135  K 4.4 4.3  CL 105 107  CO2 15* 15*  GLUCOSE 89 108*  BUN 82* 90*  CREATININE 4.59* 5.22*  CALCIUM 9.3 8.8*   GFR: Estimated Creatinine Clearance: 9.4 mL/min (A) (by C-G formula based on SCr of 5.22 mg/dL (H)). Liver Function Tests: No results for input(s): AST, ALT, ALKPHOS, BILITOT, PROT, ALBUMIN in the last 168 hours. No results for input(s): LIPASE, AMYLASE in the last 168 hours. No results for input(s): AMMONIA in the last 168 hours. Coagulation Profile: No results for input(s): INR, PROTIME in the last 168 hours. Cardiac Enzymes: No results for input(s): CKTOTAL, CKMB, CKMBINDEX, TROPONINI in the last 168 hours. BNP (last 3 results) No results for input(s): PROBNP in the last 8760 hours. HbA1C: Recent Labs    05/08/19 1347  HGBA1C 4.8   CBG: No results for input(s): GLUCAP in the last 168 hours. Lipid Profile: No results for input(s): CHOL, HDL, LDLCALC, TRIG, CHOLHDL, LDLDIRECT in the last 72 hours. Thyroid Function Tests: No results for input(s): TSH, T4TOTAL, FREET4, T3FREE, THYROIDAB in the last 72 hours. Anemia Panel: Recent Labs    05/08/19 2112 05/08/19 2200  VITAMINB12  --  702  FOLATE  --  4.9*  FERRITIN  --  1,744*  TIBC  --  189*  IRON  --  100  RETICCTPCT 3.1  --    Urine analysis:    Component Value Date/Time   COLORURINE YELLOW 05/09/2019 0533   APPEARANCEUR HAZY (A) 05/09/2019 0533   LABSPEC 1.013 05/09/2019 0533   PHURINE 7.0 05/09/2019 0533   GLUCOSEU NEGATIVE 05/09/2019 0533   HGBUR NEGATIVE 05/09/2019 0533   BILIRUBINUR NEGATIVE 05/09/2019 0533   BILIRUBINUR Negative 01/04/2019 1030   KETONESUR NEGATIVE 05/09/2019 0533   PROTEINUR 30 (A) 05/09/2019 0533   UROBILINOGEN 0.2 01/04/2019 1030   UROBILINOGEN 0.2 10/15/2014 0946   NITRITE NEGATIVE 05/09/2019 0533    LEUKOCYTESUR SMALL (A) 05/09/2019 0533    Radiological Exams on Admission: DG Chest Portable 1 View  Result Date: 05/09/2019 CLINICAL DATA:  Hypotension. EXAM: PORTABLE CHEST 1 VIEW COMPARISON:  05/18/2019 FINDINGS: Cardiac silhouette is stable. Again noted is an SVC stent. There is vascular congestion without overt pulmonary edema. There is a new dense airspace opacity at the left lung base concerning for pneumonia or aspiration. There is no pneumothorax. No significant pleural effusion. No acute osseous abnormality. IMPRESSION: Findings concerning for developing left lower lobe pneumonia in the appropriate clinical setting. Electronically Signed   By: Constance Holster M.D.   On: 05/09/2019 00:13    EKG: Independently reviewed.  Sinus rhythm, no significant change since prior tracing.  Assessment/Plan Principal Problem:   Pneumonia due to COVID-19 virus Active Problems:   AKI (acute kidney injury) ([place])   Hypotension   Anemia   CHF (congestive heart failure) ([facility])   Left lower lobe pneumonia, suspect secondary to COVID-19 viral infection: Afebrile.  Labs showing leukopenia, consistent with prior labs.  Hypotension likely due to severe volume depletion.  No tachycardia or other signs of sepsis.  Lactic acid normal.  Chest x-ray showing findings concerning for developing left lower lobe pneumonia.  SARS-CoV-2 PCR test positive.  Currently not hypoxic. -Remdesivir dosing per pharmacy -She was given a dose of stress dose steroid/Solu-Cortef for hypotension given she is on chronic steroids due to history of renal transplant.  Continue steroid coverage  with IV Decadron 6 mg daily starting tomorrow. -Continue ceftriaxone and azithromycin at this time.  Check procalcitonin level. -Antitussive as needed -Tylenol as needed -Vitamin C, zinc -Check inflammatory markers -Airborne and contact precautions -Continuous pulse ox -Supplemental oxygen as needed to keep oxygen saturation above 90% -Blood  culture x2 pending  Hypotension: Suspect related to severe volume depletion from diuretic use.  Continue to be hypotensive with systolic in the 42V despite receiving 1 L fluid bolus.  Awake and alert, mentating well.  She was given a dose of stress dose steroid for hypotension given she is on chronic steroids due to history of renal transplant.  Additional 500 cc fluid bolus ordered.  Goal is to have MAP of at least 60.  Anemia: Hemoglobin 7.6, was in the 7-8 range on recent labs.  Not endorsing any symptoms of GI bleed and FOBT negative.  Suspect related to advanced renal disease and folate deficiency based on iron studies.  She received approximately 250 cc of PRBC transfusion in the ED which was then held as patient complained of nausea.  After speaking to the patient it seems her nausea was related to taking oxycodone on an empty stomach.  No signs of acute transfusion reaction at this time.  Repeat CBC and order additional PRBCs if needed.  Continue folate supplementation.  Consult nephrology in a.m.  AKI on CKD stage IV: History of ESRD status post renal transplant complicated by CKD stage IV on chronic steroid and immunosuppressive therapy.  Creatinine was ranging between 3.5-3.7 on recent labs, now up to 5.2.  BUN 90.  Potassium level normal.  Bicarb 15 and was low on labs done 3 weeks ago as well.   Hold immunosuppressive therapy at this time given acute infection. Please consult nephrology in a.m.   Chronic diastolic congestive heart failure: No signs of volume overload at this time.  Hold diuretic given hypotension.  Chest pain: EKG without acute ischemic changes.  Appears comfortable on exam.  Continue cardiac monitoring and check troponin level.  DVT prophylaxis: Subcutaneous heparin Code Status: Full code Family Communication: Family member/uncle at bedside. Disposition Plan: Anticipate discharge after clinical improvement. Admission status: It is my clinical opinion that admission to  INPATIENT is reasonable and necessary because of the expectation that this patient will require hospital care that crosses at least 2 midnights to treat this condition based on the medical complexity of the problems presented.  Given the aforementioned information, the predictability of an adverse outcome is felt to be significant.  The medical decision making on this patient was of high complexity and the patient is at high risk for clinical deterioration, therefore this is a level 3 visit.  Shela Leff MD [facility]  If 7PM-7AM, please contact night-coverage [email or web address]  05/09/2019, 7:10 AM

## 2019-05-09 NOTE — ED Notes (Signed)
Report given to 5W RN. All questions answered 

## 2019-05-09 NOTE — ED Notes (Signed)
Admitting at bedside 

## 2019-05-09 NOTE — ED Notes (Signed)
Patient concerned that she has not gotten any lasix while being here as she takes 80mg  of lasix daily at home. She also has not gotten any of her anti rejection medications. Dr. Jacinta Shoe paged by this RN in regards to patient's concerns.

## 2019-05-09 NOTE — ED Notes (Signed)
Lunch Tray Ordered @ 1052.  

## 2019-05-09 NOTE — ED Notes (Signed)
Patient now states she is also supposed to be on azothioprine, reglan and omeprazole. Pt became upset when this RN advised that medications were not ordered. Dr. Broadus John paged in regards to patient's concerns.

## 2019-05-09 NOTE — ED Notes (Signed)
Date and time results received: 05/09/19 6:42 AM (use smartphrase ".now" to insert current time)  Test:COVID Critical Value: Positive  Name of Provider Notified: Marlowe Sax

## 2019-05-09 NOTE — ED Notes (Signed)
Pt being transfused PRBC and began to experience sudden onset of nausea. Blood transfusion stopped at that time.  Dr. Marlowe Sax informed. Gave orders to RN to stop blood transfusion. Approx. 200 mL transfused. 4 mg zofran given. MD to assess patient.

## 2019-05-09 NOTE — ED Notes (Signed)
Dr. Broadus John at bedside, patient still concerned about not receiving her lasix and stating that she does not understand why we are giving her more fluids and no lasix since she feels like she is not urinating enough. MD explained in depth about reason for holding lasix at this time (hypotension and worsening renal function). Pt still upset that she is not getting lasix and stated that we are not doing what we need to do for her. MD reassured patient that we are watching her renal function and BP and attempted to make sure we do not worsen her renal function with lasix. MD requested this RN bladder scan patient to ensure she is not retaining fluids at this time. MD also advised this RN to hold 515ml NS bolus that was ordered. Patient agreeable to blood transfusion at this time.

## 2019-05-09 NOTE — Progress Notes (Signed)
Patient seen and examined, admitted earlier this morning by Dr. Marlowe Sax, please see her H&P for details, briefly this is a chronically ill 54 year old female with history of renal transplant x3, CKD 4/5, chronic diastolic CHF, chronic anemia, chronic hypotension, liver cirrhosis on imaging was sent to the ED by her cardiologist due to worsening anemia, kidney function and hypotension. Patient reports feeling poorly for 3 weeks, progressive dyspnea on exertion.  She is followed closely by Dr. Marval Regal with [facility] and [facility] heart care. -While in the ED, she was afebrile, hypotensive with blood pressure in the 70-80 range, hemoglobin was down to 6.7, Hemoccult negative, creatinine worsened at 5.2 from baseline of 3.5-3.7, in addition COVID-19 PCR was positive, chest x-ray notable for developing left lower lobe pneumonia, no evidence of hypoxemia. -This morning she was given half a unit of blood which was subsequently held given nausea and mid transfusion, no other signs of transfusion reaction, remained hypotensive, subsequently given a liter of fluids. -Patient seen in the ED this morning, complains of not getting her transplant meds early this morning, also demanding her diuretics. Exam: Chronically ill-appearing African-American female, awake alert oriented x3 HEENT: Unable to assess JVD Lungs: Diminished breath sounds the bases, otherwise clear Abdomen: Soft, nontender, bowel sounds present Extremities: Trace ankle edema  Acute on chronic anemia -Hemoglobin has been drifting down over the last few months from 9-10 range down to 8, then 7 and now 6.7 -Patient denies any overt bleeding, she is not on any blood thinners -Hemoccult negative -Could be secondary to chronic disease and folate deficiency, anemia panel with preserved iron stores -Transfuse 1 unit of PRBC today -Add folic acid, also check Bili to r/o hemolysis  AKI on CKD 4 Renal transplant x3, last transplant in  2005 Followed by Dr. Arty Baumgartner at [facility] Baseline creatinine is 3.5-3.7, creatinine on admission is 5.2 now -Likely hemodynamically mediated, nephrology consulted -Monitor urine output also check renal ultrasound -Given a fluid bolus in the ED, transfuse 1 unit of PRBC -BMP in a.m. -Resume transplant meds namely Prograf and azathioprine, stress dose steroids instead of prednisone now  Acute on chronic hypotension -Patient reports that her baseline blood pressures in the 80s -BP in the 70-80 range now, appears asymptomatic -Likely month multifactorial, worsened in the setting of anemia, will transfuse 1 unit of PRBC -Cannot rule out AI, she is on chronic prednisone, will add stress dose steroids today -Add low-dose midodrine  COVID pneumonia -Without evidence of hypoxemia, chest x-ray with left lower lobe infiltrate -Started on IV remdesivir on admission which will be continued -Stop Decadron, start stress dose IV steroids  Chronic diastolic CHF -Does not appear volume overloaded at this time -Hold Lasix this morning -Monitor urine output  Domenic Polite, MD

## 2019-05-09 NOTE — ED Notes (Signed)
  Dr. Dallie Piles informed there was difficulty obtaining IV access.  IV's will not pull back blood. Pt was stuck multiple times. Phlebotomy requested for blood cultures. Blood ready in blood bank. Goal to obtain at least 1 set of blood cultures before initiation of blood administration.

## 2019-05-10 ENCOUNTER — Inpatient Hospital Stay (HOSPITAL_COMMUNITY): Payer: Medicaid Other

## 2019-05-10 DIAGNOSIS — N179 Acute kidney failure, unspecified: Secondary | ICD-10-CM

## 2019-05-10 DIAGNOSIS — I959 Hypotension, unspecified: Secondary | ICD-10-CM

## 2019-05-10 DIAGNOSIS — N185 Chronic kidney disease, stage 5: Secondary | ICD-10-CM

## 2019-05-10 DIAGNOSIS — D631 Anemia in chronic kidney disease: Secondary | ICD-10-CM

## 2019-05-10 LAB — BASIC METABOLIC PANEL
Anion gap: 19 — ABNORMAL HIGH (ref 5–15)
BUN: 92 mg/dL — ABNORMAL HIGH (ref 6–20)
CO2: 11 mmol/L — ABNORMAL LOW (ref 22–32)
Calcium: 9.1 mg/dL (ref 8.9–10.3)
Chloride: 106 mmol/L (ref 98–111)
Creatinine, Ser: 4.99 mg/dL — ABNORMAL HIGH (ref 0.44–1.00)
GFR calc Af Amer: 11 mL/min — ABNORMAL LOW (ref >60)
GFR calc non Af Amer: 9 mL/min — ABNORMAL LOW (ref >60)
Glucose, Bld: 106 mg/dL — ABNORMAL HIGH (ref 70–99)
Potassium: 5 mmol/L (ref 3.5–5.1)
Sodium: 136 mmol/L (ref 135–145)

## 2019-05-10 LAB — CBC
HCT: 26.8 % — ABNORMAL LOW (ref 36.0–46.0)
Hemoglobin: 8.8 g/dL — ABNORMAL LOW (ref 12.0–15.0)
MCH: 32.1 pg (ref 26.0–34.0)
MCHC: 32.8 g/dL (ref 30.0–36.0)
MCV: 97.8 fL (ref 80.0–100.0)
Platelets: 157 10*3/uL (ref 150–400)
RBC: 2.74 MIL/uL — ABNORMAL LOW (ref 3.87–5.11)
RDW: 22.5 % — ABNORMAL HIGH (ref 11.5–15.5)
WBC: 3.2 10*3/uL — ABNORMAL LOW (ref 4.0–10.5)
nRBC: 1.2 % — ABNORMAL HIGH (ref 0.0–0.2)

## 2019-05-10 LAB — TYPE AND SCREEN
ABO/RH(D): B POS
Antibody Screen: NEGATIVE
Unit division: 0
Unit division: 0

## 2019-05-10 LAB — BPAM RBC
Blood Product Expiration Date: 202105032359
Blood Product Expiration Date: 202105032359
ISSUE DATE / TIME: 202104060038
ISSUE DATE / TIME: 202104060914
Unit Type and Rh: 7300
Unit Type and Rh: 7300

## 2019-05-10 LAB — URINE CULTURE: Culture: 10000 — AB

## 2019-05-10 MED ORDER — PROMETHAZINE HCL 25 MG/ML IJ SOLN
12.5000 mg | Freq: Four times a day (QID) | INTRAMUSCULAR | Status: DC | PRN
  Administered 2019-05-10 – 2019-05-14 (×5): 12.5 mg via INTRAVENOUS
  Filled 2019-05-10 (×6): qty 1

## 2019-05-10 MED ORDER — SODIUM BICARBONATE-DEXTROSE 150-5 MEQ/L-% IV SOLN
150.0000 meq | INTRAVENOUS | Status: DC
  Administered 2019-05-10 (×2): 150 meq via INTRAVENOUS
  Filled 2019-05-10 (×6): qty 1000

## 2019-05-10 MED ORDER — SODIUM CHLORIDE 0.9 % IV SOLN
1.0000 g | INTRAVENOUS | Status: AC
  Administered 2019-05-11 – 2019-05-17 (×7): 1 g via INTRAVENOUS
  Filled 2019-05-10 (×7): qty 10

## 2019-05-10 MED ORDER — PROMETHAZINE HCL 25 MG RE SUPP
25.0000 mg | Freq: Four times a day (QID) | RECTAL | Status: DC | PRN
  Filled 2019-05-10: qty 1

## 2019-05-10 MED ORDER — OXYCODONE-ACETAMINOPHEN 5-325 MG PO TABS
1.0000 | ORAL_TABLET | Freq: Three times a day (TID) | ORAL | Status: DC | PRN
  Administered 2019-05-10: 1 via ORAL
  Filled 2019-05-10: qty 1

## 2019-05-10 MED ORDER — SODIUM CHLORIDE 0.9 % IV SOLN
500.0000 mg | INTRAVENOUS | Status: DC
  Administered 2019-05-10 – 2019-05-11 (×2): 500 mg via INTRAVENOUS
  Filled 2019-05-10 (×2): qty 500

## 2019-05-10 MED ORDER — DARBEPOETIN ALFA 150 MCG/0.3ML IJ SOSY
150.0000 ug | PREFILLED_SYRINGE | Freq: Once | INTRAMUSCULAR | Status: AC
  Administered 2019-05-10: 150 ug via SUBCUTANEOUS
  Filled 2019-05-10: qty 0.3

## 2019-05-10 MED ORDER — PROMETHAZINE HCL 25 MG PO TABS
25.0000 mg | ORAL_TABLET | Freq: Four times a day (QID) | ORAL | Status: DC | PRN
  Administered 2019-05-12: 25 mg via ORAL
  Filled 2019-05-10: qty 1

## 2019-05-10 MED ORDER — OXYCODONE HCL 5 MG PO TABS: 5.0000 mg | ORAL_TABLET | Freq: Three times a day (TID) | ORAL | Status: DC | PRN

## 2019-05-10 MED ORDER — SODIUM CHLORIDE 0.9 % IV SOLN
1.0000 g | Freq: Once | INTRAVENOUS | Status: AC
  Administered 2019-05-10: 1 g via INTRAVENOUS
  Filled 2019-05-10: qty 10

## 2019-05-10 NOTE — Progress Notes (Signed)
[facility] NEPHROLOGY PROGRESS NOTE  Assessment/ Plan: Pt is a 54 y.o. yo female with history of ESRD status post renal transplant x3, most recent in 08/2003, hypertension, TIA, heart failure, admitted with Covid pneumonia, seen for AKI on CKD. Home transplant medications: Prograf 3/2, azathioprine 100 daily, prednisone 5 daily.  #COVID-19 pneumonia: Currently on IV steroid, remdesivir.  Also receiving empiric IV Rocephin and azithromycin to cover bacterial infection because of immunocompromised status.  Per primary team.  #Acute kidney injury on CKD stage IV in transplant kidney likely due to Covid infection, hypotension: Urine contamination, minimal protein.  Kidney ultrasound without hydronephrosis of transplanted kidney.  Continue IV sodium bicarbonate because of metabolic acidosis.  Hopefully kidney function will improve to avoid dialysis.  #History of ESRD status post kidney transplant x3, continue Prograf, azathioprine.  Currently on IV steroid.  Follow-up Prograf level.  # Anemia of CKD and acute illness: Iron saturation acceptable.  Hemoccult negative.  I will start ESA injection.  Received a unit of blood transfusion.  #Hypotension: On midodrine.  Monitor blood pressure.  #Metabolic acidosis: Continue IV sodium bicarbonate.  Subjective: Seen and examined at bedside.  Patient reported nausea and abdominal pain.  Denies chest pain, shortness of breath.  No urine output measured. Objective Vital signs in last 24 hours: Vitals:   05/10/19 0118 05/10/19 0500 05/10/19 0817 05/10/19 1238  BP:  (!) 97/57 103/65 (!) 126/92  Pulse:  (!) 51 (!) 54 61  Resp:  20 18 17   Temp: 98.4 F (36.9 C) 97.9 F (36.6 C) 98 F (36.7 C) 97.9 F (36.6 C)  TempSrc: Oral Oral Oral Oral  SpO2:  96% 94% 93%  Weight:      Height:       Weight change: -0.09 kg  Intake/Output Summary (Last 24 hours) at 05/10/2019 1509 Last data filed at 05/10/2019 0001 Gross per 24 hour  Intake 480 ml   Output --  Net 480 ml       Labs: Basic Metabolic Panel: Recent Labs  Lab 05/08/19 0854 05/08/19 2133 05/10/19 0321  NA 138 135 136  K 4.4 4.3 5.0  CL 105 107 106  CO2 15* 15* 11*  GLUCOSE 89 108* 106*  BUN 82* 90* 92*  CREATININE 4.59* 5.22* 4.99*  CALCIUM 9.3 8.8* 9.1   Liver Function Tests: Recent Labs  Lab 05/09/19 1130  BILITOT 0.8   No results for input(s): LIPASE, AMYLASE in the last 168 hours. No results for input(s): AMMONIA in the last 168 hours. CBC: Recent Labs  Lab 05/08/19 0854 05/08/19 2133 05/09/19 0618 05/10/19 0321  WBC 2.9* 3.1* 2.0* 3.2*  HGB 6.7* 7.6* 6.7* 8.8*  HCT 19.5* 23.8* 21.6* 26.8*  MCV 98* 103.9* 105.9* 97.8  PLT 170 182 150 157   Cardiac Enzymes: No results for input(s): CKTOTAL, CKMB, CKMBINDEX, TROPONINI in the last 168 hours. CBG: No results for input(s): GLUCAP in the last 168 hours.  Iron Studies:  Recent Labs    05/08/19 2200  IRON 100  TIBC 189*  FERRITIN 1,744*   Studies/Results: US RENAL  Result Date: 05/09/2019 CLINICAL DATA:  AKI, renal transplant EXAM: RENAL / URINARY TRACT ULTRASOUND COMPLETE COMPARISON:  None. FINDINGS: Kidneys: Native kidneys are absent surgically. Right lower quadrant transplant kidney measures 10.7 x 4.9 x 4.8 cm = volume 131.5 mL. No hydronephrosis. Bladder: Appears normal for degree of bladder distention. Other: None. IMPRESSION: Bilateral nephrectomy with right lower quadrant transplant. No hydronephrosis. Electronically Signed   By: Malachi Carl  Patel M.D.   On: 05/09/2019 15:10   DG Chest Portable 1 View  Result Date: 05/09/2019 CLINICAL DATA:  Hypotension. EXAM: PORTABLE CHEST 1 VIEW COMPARISON:  05/18/2019 FINDINGS: Cardiac silhouette is stable. Again noted is an SVC stent. There is vascular congestion without overt pulmonary edema. There is a new dense airspace opacity at the left lung base concerning for pneumonia or aspiration. There is no pneumothorax. No significant pleural  effusion. No acute osseous abnormality. IMPRESSION: Findings concerning for developing left lower lobe pneumonia in the appropriate clinical setting. Electronically Signed   By: Constance Holster M.D.   On: 05/09/2019 00:13   DG Abd Portable 1V  Result Date: 05/10/2019 CLINICAL DATA:  Abdominal pain. EXAM: PORTABLE ABDOMEN - 1 VIEW COMPARISON:  Plain films of the abdomen 06/17/2015. FINDINGS: The bowel gas pattern is normal. No radio-opaque calculi or other significant radiographic abnormality are seen. Multiple surgical clips in the pelvis and upper abdomen are unchanged. IMPRESSION: No acute finding. Electronically Signed   By: Inge Rise M.D.   On: 05/10/2019 13:55    Medications: Infusions: . azithromycin Stopped (05/10/19 1017)  . [START ON 05/11/2019] cefTRIAXone (ROCEPHIN)  IV    . remdesivir 100 mg in NS 100 mL 100 mg (05/10/19 1018)  . sodium bicarbonate 150 mEq in dextrose 5% 1000 mL 150 mEq (05/10/19 1143)  . sodium chloride Stopped (05/09/19 0929)    Scheduled Medications: . vitamin C  500 mg Oral Daily  . aspirin EC  81 mg Oral Daily  . azaTHIOprine  100 mg Oral Daily  . folic acid  1 mg Oral Daily  . gabapentin  100 mg Oral BID  . heparin  5,000 Units Subcutaneous Q8H  . hydrocortisone sod succinate (SOLU-CORTEF) inj  50 mg Intravenous Q6H  . latanoprost  1 drop Left Eye QHS  . midodrine  5 mg Oral BID WC  . pantoprazole  40 mg Oral Q1200  . tacrolimus  2 mg Oral QPM  . tacrolimus  3 mg Oral q morning - 10a  . zinc sulfate  220 mg Oral Daily    have reviewed scheduled and prn medications.  Physical Exam: General:NAD, comfortable Heart:RRR, s1s2 nl Lungs:clear b/l, no crackle Abdomen:soft, mild epigastric tenderness, non-distended Extremities:No LE edema Neurology: Alert awake and following commands, no asterixis  Afnan Cadiente Tanna Furry 05/10/2019,3:09 PM  LOS: 1 day  Pager: [phone]

## 2019-05-10 NOTE — Progress Notes (Signed)
PROGRESS NOTE                                                                                                                                                                                                             Patient Demographics:    Tina Watkins, is a 54 y.o. female, DOB - Oct 04, 1965, OVA:[id number]  Admit date - 05/08/2019   Admitting Physician Domenic Polite, MD  Outpatient Primary MD for the patient is [name], FNP  LOS - 1   Chief Complaint  Patient presents with  . Low Hemoglobin  . Hypotension       Brief Narrative    This is a chronically ill 54 year old female with history of renal transplant x3, CKD 4/5, chronic diastolic CHF, chronic anemia, chronic hypotension, liver cirrhosis on imaging was sent to the ED by her cardiologist due to worsening anemia,worsening kidney function and hypotension. Patient reports feeling poorly for 3 weeks, progressive dyspnea on exertion.  She is followed closely by Dr. Marval Regal with [facility] and [facility] heart care. -While in the ED, she was afebrile, hypotensive with systolic blood pressure in the 70-80 range, hemoglobin was down to 6.7, Hemoccult negative, creatinine worsened at 5.2 from baseline of 3.5-3.7, in addition COVID-19 PCR was positive, chest x-ray notable for developing left lower lobe pneumonia, no evidence of hypoxemia. -No evidence of significant bleed, patient had fusion of the first unit, so that unit has been held, she did receive another unit after that second issues with good response with her hemoglobin.    Subjective:    Tina Watkins today reports generalized weakness, fatigue, some abdominal pain and nausea.    Assessment  & Plan :    Principal Problem:   Pneumonia due to COVID-19 virus Active Problems:   AKI (acute kidney injury) ([place])   Hypotension   Anemia   CHF (congestive heart failure) ([place])   COVID-19 for pneumonia . -Chest  x-ray significant for left lung opacity . -There is no evidence of hypoxia . -Continue to trend inflammatory markers closely . -Continue with IV steroids . -Continue with IV remdesivir . -Procalcitonin is elevated, patient is immunocompromise, will start empirically on IV Rocephin and azithromycin to cover for bacterial infection as well . -Sh e was encouraged with incentive spirometry .  Hypotension: -Acute on chronic, multifactorial secondary to  anemia, volume depletion and dehydration. -Started on low-dose midodrine -Received appropriate volume resuscitation with IV fluids and lesions. -Steroid-dependent, currently on stress dose steroids.  Anemia of chronic kidney disease -No evidence of significant blood loss, she is Hemoccult negative, this is most likely anemia of chronic kidney disease. -Globin 6.7 on presentation, responded to 1 unit PRBC transfusion repeat is 8.8. -procrit per renal.  AKI on CKD stage IV:  - History of ESRD status post renal transplant complicated by CKD stage IV on chronic steroid and immunosuppressive therapy.   - Creatinine was ranging between 3.5-3.7 on recent labs, now up to 5.2.  BUN 90.  Potassium level normal.   - low Bicarb, on bicarb drip currently. - continue with immunosuppressive therapy.  Metabolic acidosis -Bicarb of 11 today, continue with bicarb drip per renal  Chronic diastolic congestive heart failure:  - No signs of volume overload at this time.  Hold diuretic given hypotension.   COVID-19 Labs  Recent Labs    05/08/19 2200 05/09/19 0751  DDIMER  --  2.25*  FERRITIN 1,744*  --   LDH  --  241*  CRP  --  5.7*    Lab Results  Component Value Date   SARSCOV2NAA POSITIVE (A) 05/09/2019   [place] NEGATIVE 03/17/2019     Code Status : Inpatient  Family Communication  : D/W mother via phone 4/7.  Disposition Plan  : home   Barriers For Discharge : On bicarb drip, IV antibiotic, remdesivir and steroids  Consults   :  renal  Procedures  : none  DVT Prophylaxis  : [place] heparin  Lab Results  Component Value Date   PLT 157 05/10/2019    Antibiotics  :    Anti-infectives (From admission, onward)   Start     Dose/Rate Route Frequency Ordered Stop   05/11/19 0600  cefTRIAXone (ROCEPHIN) 1 g in sodium chloride 0.9 % 100 mL IVPB     1 g 200 mL/hr over 30 Minutes Intravenous Every 24 hours 05/10/19 0639     05/10/19 1000  remdesivir 100 mg in sodium chloride 0.9 % 100 mL IVPB     100 mg 200 mL/hr over 30 Minutes Intravenous Daily 05/09/19 0646 05/14/19 0959   05/10/19 0800  azithromycin (ZITHROMAX) 500 mg in sodium chloride 0.9 % 250 mL IVPB     500 mg 250 mL/hr over 60 Minutes Intravenous Every 24 hours 05/10/19 0639     05/10/19 0645  cefTRIAXone (ROCEPHIN) 1 g in sodium chloride 0.9 % 100 mL IVPB     1 g 200 mL/hr over 30 Minutes Intravenous  Once 05/10/19 0641 05/10/19 0900   05/09/19 0730  remdesivir 100 mg in sodium chloride 0.9 % 100 mL IVPB     100 mg 200 mL/hr over 30 Minutes Intravenous Every 30 min 05/09/19 0646 05/09/19 1017   05/09/19 0145  azithromycin (ZITHROMAX) 500 mg in sodium chloride 0.9 % 250 mL IVPB     500 mg 250 mL/hr over 60 Minutes Intravenous  Once 05/09/19 0134 05/09/19 0326   05/09/19 0145  cefTRIAXone (ROCEPHIN) 1 g in sodium chloride 0.9 % 100 mL IVPB     1 g 200 mL/hr over 30 Minutes Intravenous  Once 05/09/19 0134 05/09/19 0221        Objective:   Vitals:   05/10/19 0118 05/10/19 0500 05/10/19 0817 05/10/19 1238  BP:  (!) 97/57 103/65 (!) 126/92  Pulse:  (!) 51 (!) 54 61  Resp:  20 18  17  Temp: 98.4 F (36.9 C) 97.9 F (36.6 C) 98 F (36.7 C) 97.9 F (36.6 C)  TempSrc: Oral Oral Oral Oral  SpO2:  96% 94% 93%  Weight:      Height:        Wt Readings from Last 3 Encounters:  05/09/19 52.2 kg  05/08/19 52.3 kg  04/19/19 54.3 kg     Intake/Output Summary (Last 24 hours) at 05/10/2019 1312 Last data filed at 05/10/2019 0001 Gross per 24 hour    Intake 480 ml  Output --  Net 480 ml     Physical Exam  Awake Alert, Oriented X 3, frail, chronically ill-appearing, no new F.N deficits, Normal affect Symmetrical Chest wall movement, Good air movement bilaterally, CTAB RRR,No Gallops,Rubs or new Murmurs, No Parasternal Heave +ve B.Sounds, Abd Soft, No tenderness,  No rebound - guarding or rigidity. No Cyanosis, Clubbing or edema, No new Rash or bruise      Data Review:    CBC Recent Labs  Lab 05/08/19 0854 05/08/19 2133 05/09/19 0618 05/10/19 0321  WBC 2.9* 3.1* 2.0* 3.2*  HGB 6.7* 7.6* 6.7* 8.8*  HCT 19.5* 23.8* 21.6* 26.8*  PLT 170 182 150 157  MCV 98* 103.9* 105.9* 97.8  MCH 33.5* 33.2 32.8 32.1  MCHC 34.4 31.9 31.0 32.8  RDW 21.9* 23.4* 23.0* 22.5*    Chemistries  Recent Labs  Lab 05/08/19 0854 05/08/19 2133 05/09/19 1130 05/10/19 0321  NA 138 135  --  136  K 4.4 4.3  --  5.0  CL 105 107  --  106  CO2 15* 15*  --  11*  GLUCOSE 89 108*  --  106*  BUN 82* 90*  --  92*  CREATININE 4.59* 5.22*  --  4.99*  CALCIUM 9.3 8.8*  --  9.1  BILITOT  --   --  0.8  --    ------------------------------------------------------------------------------------------------------------------ No results for input(s): CHOL, HDL, LDLCALC, TRIG, CHOLHDL, LDLDIRECT in the last 72 hours.  Lab Results  Component Value Date   HGBA1C 4.8 05/08/2019   ------------------------------------------------------------------------------------------------------------------ No results for input(s): TSH, T4TOTAL, T3FREE, THYROIDAB in the last 72 hours.  Invalid input(s): FREET3 ------------------------------------------------------------------------------------------------------------------ Recent Labs    05/08/19 2112 05/08/19 2200  VITAMINB12  --  702  FOLATE  --  4.9*  FERRITIN  --  1,744*  TIBC  --  189*  IRON  --  100  RETICCTPCT 3.1  --     Coagulation profile No results for input(s): INR, PROTIME in the last 168  hours.  Recent Labs    05/09/19 0751  DDIMER 2.25*    Cardiac Enzymes No results for input(s): CKMB, TROPONINI, MYOGLOBIN in the last 168 hours.  Invalid input(s): CK ------------------------------------------------------------------------------------------------------------------    Component Value Date/Time   BNP 481.5 (H) 04/17/2019 1718    Inpatient Medications  Scheduled Meds: . vitamin C  500 mg Oral Daily  . aspirin EC  81 mg Oral Daily  . azaTHIOprine  100 mg Oral Daily  . folic acid  1 mg Oral Daily  . gabapentin  100 mg Oral BID  . heparin  5,000 Units Subcutaneous Q8H  . hydrocortisone sod succinate (SOLU-CORTEF) inj  50 mg Intravenous Q6H  . latanoprost  1 drop Left Eye QHS  . midodrine  5 mg Oral BID WC  . pantoprazole  40 mg Oral Q1200  . tacrolimus  2 mg Oral QPM  . tacrolimus  3 mg Oral q morning - 10a  .  zinc sulfate  220 mg Oral Daily   Continuous Infusions: . azithromycin Stopped (05/10/19 1017)  . [START ON 05/11/2019] cefTRIAXone (ROCEPHIN)  IV    . remdesivir 100 mg in NS 100 mL 100 mg (05/10/19 1018)  . sodium bicarbonate 150 mEq in dextrose 5% 1000 mL 150 mEq (05/10/19 1143)  . sodium chloride Stopped (05/09/19 0929)   PRN Meds:.acetaminophen, benzonatate, lidocaine, ondansetron (ZOFRAN) IV  Micro Results Recent Results (from the past 240 hour(s))  Blood culture (routine x 2)     Status: None (Preliminary result)   Collection Time: 05/09/19 12:21 AM   Specimen: BLOOD RIGHT HAND  Result Value Ref Range Status   Specimen Description BLOOD RIGHT HAND  Final   Special Requests   Final    BOTTLES DRAWN AEROBIC AND ANAEROBIC Blood Culture results may not be optimal due to an inadequate volume of blood received in culture bottles   Culture   Final    NO GROWTH 1 DAY Performed at [facility], [place] [address], [place], [place] [address]    Report Status PENDING  Incomplete  SARS CORONAVIRUS 2 (TAT 6-24 HRS) Nasopharyngeal Nasopharyngeal  Swab     Status: Abnormal   Collection Time: 05/09/19 12:25 AM   Specimen: Nasopharyngeal Swab  Result Value Ref Range Status   SARS Coronavirus 2 POSITIVE (A) NEGATIVE Final    Comment: RESULT CALLED TO, READ BACK BY AND VERIFIED WITH: RN OSORIO BAILEY AT 0620 ON 05/09/2019 BY MESSAN HOUEGNIFIO (NOTE) SARS-CoV-2 target nucleic acids are DETECTED. The SARS-CoV-2 RNA is generally detectable in upper and lower respiratory specimens during the acute phase of infection. Positive results are indicative of the presence of SARS-CoV-2 RNA. Clinical correlation with patient history and other diagnostic information is  necessary to determine patient infection status. Positive results do not rule out bacterial infection or co-infection with other viruses.  The expected result is Negative. Fact Sheet for Patients: SugarRoll.be Fact Sheet for Healthcare Providers: https://www.woods-mathews.com/ This test is not yet approved or cleared by the [place] FDA and  has been authorized for detection and/or diagnosis of SARS-CoV-2 by FDA under an Emergency Use Authorization (EUA). This EUA will remain  in effect (meaning this tes t can be used) for the duration of the COVID-19 declaration under Section 564(b)(1) of the Act, 21 U.S.C. section 360bbb-3(b)(1), unless the authorization is terminated or revoked sooner. Performed at [facility], [place] [address], [place], [place] [address]   Gram stain     Status: None   Collection Time: 05/09/19  5:26 AM   Specimen: Urine, Random  Result Value Ref Range Status   Specimen Description URINE, RANDOM  Final   Special Requests Immunocompromised  Final   Gram Stain   Final    WBC PRESENT,BOTH PMN AND MONONUCLEAR GRAM POSITIVE COCCI IN PAIRS GRAM VARIABLE ROD CYTOSPIN SMEAR Performed at [facility], [address], [place], [place] [address]    Report Status 05/09/2019 FINAL  Final  Urine culture      Status: Abnormal   Collection Time: 05/09/19  5:26 AM   Specimen: Urine, Random  Result Value Ref Range Status   Specimen Description URINE, RANDOM  Final   Special Requests Immunocompromised  Final   Culture (A)  Final    10,000 COLONIES/mL DIPHTHEROIDS(CORYNEBACTERIUM SPECIES) Standardized susceptibility testing for this organism is not available. Performed at [facility], [place] [address] [place], [place] [address]    Report Status 05/10/2019 FINAL  Final  Blood culture (routine x 2)     Status: None (Preliminary result)   Collection Time: 05/09/19  6:18 AM   Specimen: BLOOD RIGHT HAND  Result Value Ref Range Status   Specimen Description BLOOD RIGHT HAND  Final   Special Requests   Final    BOTTLES DRAWN AEROBIC AND ANAEROBIC Blood Culture results may not be optimal due to an inadequate volume of blood received in culture bottles   Culture   Final    NO GROWTH 1 DAY Performed at [facility], [place] [address] [place], [place] [address]    Report Status PENDING  Incomplete    Radiology Reports DG Chest 2 View  Result Date: 04/17/2019 CLINICAL DATA:  Dyspnea and weakness. EXAM: CHEST - 2 VIEW COMPARISON:  March 20, 2019. FINDINGS: There are stable mild cardiomegaly and superior vena cava stent graft placement. Increased interstitial opacity projects on the bilateral infrahilar regions with mild bronchial wall thickening, but without obscuration of the diaphragm or heart borders. There is persistent interstitial edema. There is no pneumothorax or obvious pleural effusion. Aortic knob calcified atherosclerosis and postsurgical change of lower cervical ACDF are present. Surgical clips project on the upper abdomen. IMPRESSION: Increased bilateral infrahilar bronchitis with nonspecific small airways disease which may be inflammatory, infectious or aspiration-related. No pneumonia. Stable cardiomegaly and postoperative changes with subtle pulmonary edema but no obvious pleural  effusion. Electronically Signed   By: Revonda Humphrey   On: 04/17/2019 15:09   US RENAL  Result Date: 05/09/2019 CLINICAL DATA:  AKI, renal transplant EXAM: RENAL / URINARY TRACT ULTRASOUND COMPLETE COMPARISON:  None. FINDINGS: Kidneys: Native kidneys are absent surgically. Right lower quadrant transplant kidney measures 10.7 x 4.9 x 4.8 cm = volume 131.5 mL. No hydronephrosis. Bladder: Appears normal for degree of bladder distention. Other: None. IMPRESSION: Bilateral nephrectomy with right lower quadrant transplant. No hydronephrosis. Electronically Signed   By: Macy Mis M.D.   On: 05/09/2019 15:10   DG Chest Portable 1 View  Result Date: 05/09/2019 CLINICAL DATA:  Hypotension. EXAM: PORTABLE CHEST 1 VIEW COMPARISON:  05/18/2019 FINDINGS: Cardiac silhouette is stable. Again noted is an SVC stent. There is vascular congestion without overt pulmonary edema. There is a new dense airspace opacity at the left lung base concerning for pneumonia or aspiration. There is no pneumothorax. No significant pleural effusion. No acute osseous abnormality. IMPRESSION: Findings concerning for developing left lower lobe pneumonia in the appropriate clinical setting. Electronically Signed   By: Constance Holster M.D.   On: 05/09/2019 00:13      Phillips Climes M.D on 05/10/2019 at 1:12 PM  Between 7am to 7pm - Pager - [phone]  After 7pm go to [email or web address] - password [email or web address]  [facility] -  Office  [phone]

## 2019-05-11 ENCOUNTER — Encounter (HOSPITAL_COMMUNITY): Payer: Self-pay | Admitting: Internal Medicine

## 2019-05-11 LAB — BASIC METABOLIC PANEL
Anion gap: 16 — ABNORMAL HIGH (ref 5–15)
BUN: 91 mg/dL — ABNORMAL HIGH (ref 6–20)
CO2: 21 mmol/L — ABNORMAL LOW (ref 22–32)
Calcium: 8.7 mg/dL — ABNORMAL LOW (ref 8.9–10.3)
Chloride: 102 mmol/L (ref 98–111)
Creatinine, Ser: 5.74 mg/dL — ABNORMAL HIGH (ref 0.44–1.00)
GFR calc Af Amer: 9 mL/min — ABNORMAL LOW (ref >60)
GFR calc non Af Amer: 8 mL/min — ABNORMAL LOW (ref >60)
Glucose, Bld: 122 mg/dL — ABNORMAL HIGH (ref 70–99)
Potassium: 3.9 mmol/L (ref 3.5–5.1)
Sodium: 139 mmol/L (ref 135–145)

## 2019-05-11 LAB — CBC
HCT: 27.1 % — ABNORMAL LOW (ref 36.0–46.0)
Hemoglobin: 8.9 g/dL — ABNORMAL LOW (ref 12.0–15.0)
MCH: 32 pg (ref 26.0–34.0)
MCHC: 32.8 g/dL (ref 30.0–36.0)
MCV: 97.5 fL (ref 80.0–100.0)
Platelets: 154 10*3/uL (ref 150–400)
RBC: 2.78 MIL/uL — ABNORMAL LOW (ref 3.87–5.11)
RDW: 21.9 % — ABNORMAL HIGH (ref 11.5–15.5)
WBC: 2.4 10*3/uL — ABNORMAL LOW (ref 4.0–10.5)
nRBC: 1.7 % — ABNORMAL HIGH (ref 0.0–0.2)

## 2019-05-11 LAB — GLUCOSE, CAPILLARY
Glucose-Capillary: 117 mg/dL — ABNORMAL HIGH (ref 70–99)
Glucose-Capillary: 113 mg/dL — ABNORMAL HIGH (ref 70–99)

## 2019-05-11 MED ORDER — AZITHROMYCIN 500 MG PO TABS
500.0000 mg | ORAL_TABLET | Freq: Every day | ORAL | Status: DC
  Administered 2019-05-12 – 2019-05-17 (×6): 500 mg via ORAL
  Filled 2019-05-11 (×6): qty 1

## 2019-05-11 MED ORDER — SODIUM CHLORIDE 0.9 % IV SOLN: INTRAVENOUS | Status: AC

## 2019-05-11 MED ORDER — METHYLPREDNISOLONE SODIUM SUCC 40 MG IJ SOLR
40.0000 mg | Freq: Two times a day (BID) | INTRAMUSCULAR | Status: DC
  Administered 2019-05-11 – 2019-05-13 (×5): 40 mg via INTRAVENOUS
  Filled 2019-05-11 (×5): qty 1

## 2019-05-11 MED ORDER — NYSTATIN 100000 UNIT/ML MT SUSP: 5.0000 mL | Freq: Four times a day (QID) | OROMUCOSAL | Status: DC | PRN

## 2019-05-11 NOTE — Progress Notes (Signed)
   05/10/19 2156  Assess: MEWS Score  Temp 97.8 F (36.6 C)  BP 107/70  Pulse Rate (!) 53  ECG Heart Rate (!) 52  Resp (!) 21  Level of Consciousness Alert  SpO2 92 %  O2 Device Room Air  Patient Activity (if Appropriate) In bed  Assess: MEWS Score  MEWS Temp 0  MEWS Systolic 0  MEWS Pulse 0  MEWS RR 1  MEWS LOC 0  MEWS Score 1  MEWS Score Color Green  Assess: if the MEWS score is Yellow or Red  Were vital signs taken at a resting state? Yes  Focused Assessment Documented focused assessment  Early Detection of Sepsis Score *See Row Information* Medium  MEWS guidelines implemented *See Row Information* No, other (Comment) (no acute changes)

## 2019-05-11 NOTE — Progress Notes (Signed)
PROGRESS NOTE                                                                                                                                                                                                             Patient Demographics:    Tina Watkins, is a 54 y.o. female, DOB - Sep 01, 1965, PRF:[id number]  Admit date - 05/08/2019   Admitting Physician Domenic Polite, MD  Outpatient Primary MD for the patient is [name], FNP  LOS - 2   Chief Complaint  Patient presents with  . Low Hemoglobin  . Hypotension       Brief Narrative    This is a chronically ill 54 year old female with history of renal transplant x3, CKD 4/5, chronic diastolic CHF, chronic anemia, chronic hypotension, liver cirrhosis on imaging was sent to the ED by her cardiologist due to worsening anemia,worsening kidney function and hypotension. Patient reports feeling poorly for 3 weeks, progressive dyspnea on exertion.  She is followed closely by Dr. Marval Regal with [facility] and [facility] heart care. -While in the ED, she was afebrile, hypotensive with systolic blood pressure in the 70-80 range, hemoglobin was down to 6.7, Hemoccult negative, creatinine worsened at 5.2 from baseline of 3.5-3.7, in addition COVID-19 PCR was positive, chest x-ray notable for developing left lower lobe pneumonia, no evidence of hypoxemia. -No evidence of significant bleed, patient had fusion of the first unit, so that unit has been held, she did receive another unit after that second issues with good response with her hemoglobin.    Subjective:    Tina Watkins today reports generalized weakness, fatigue, abdominal pain has improved, remains with some nausea, had vomiting yesterday but none today.    Assessment  & Plan :    Principal Problem:   Pneumonia due to COVID-19 virus Active Problems:   AKI (acute kidney injury) ([place])   Hypotension   Anemia   CHF  (congestive heart failure) ([place])   COVID-19 for pneumonia . -Chest x-ray significant for left lung opacity . -There is no evidence of hypoxia . -Continue to trend inflammatory markers closely . -Continue with IV steroids(change hydrocortisone to Solu-Medrol). -Continue with IV remdesivir . -Procalcitonin is elevated, patient is immunocompromise, continue with  IV Rocephin and azithromycin to cover for bacterial infection as well . -Sh e was encouraged with incentive  spirometry .  Hypotension: -Acute on chronic, multifactorial secondary to anemia, volume depletion and dehydration. -Started on low-dose midodrine -Received appropriate volume resuscitation with IV fluids and lesions. -Steroid-dependent, initially on IV hydrocortisone, currently on IV Solu-Medrol  Anemia of chronic kidney disease -No evidence of significant blood loss, she is Hemoccult negative, this is most likely anemia of chronic kidney disease. -Hemoglobin 6.7 on presentation, responded to 1 unit PRBC transfusion is 8.9 today -procrit per renal.  AKI on CKD stage IV in transplant Kidney :  - History of ESRD status post renal transplant complicated by CKD stage IV on chronic steroid and immunosuppressive therapy.   - Creatinine was ranging between 3.5-3.7 on recent labs, continue to increase, it is 5.7 today, management per renal . -May need dialysis if creatinine continues to increase . - continue with immunosuppressive therapy. -Normal protein, kidney ultrasound without hydronephrosis of transplanted kidney.  Metabolic acidosis -Resolved with bicarb drip.  Chronic diastolic congestive heart failure:  - No signs of volume overload at this time.  Hold diuretic given hypotension.   COVID-19 Labs  Recent Labs    05/08/19 2200 05/09/19 0751  DDIMER  --  2.25*  FERRITIN 1,744*  --   LDH  --  241*  CRP  --  5.7*    Lab Results  Component Value Date   SARSCOV2NAA POSITIVE (A) 05/09/2019   [place]  NEGATIVE 03/17/2019     Code Status : Inpatient  Family Communication  : D/W mother via phone 4/7.  Disposition Plan  : home   Barriers For Discharge : On iv fluids,  IV antibiotic, remdesivir and steroids, worsening renal function  Consults  :  renal  Procedures  : none  DVT Prophylaxis  : Rew heparin  Lab Results  Component Value Date   PLT 154 05/11/2019    Antibiotics  :    Anti-infectives (From admission, onward)   Start     Dose/Rate Route Frequency Ordered Stop   05/12/19 1000  azithromycin (ZITHROMAX) tablet 500 mg     500 mg Oral Daily 05/11/19 0929     05/11/19 0600  cefTRIAXone (ROCEPHIN) 1 g in sodium chloride 0.9 % 100 mL IVPB     1 g 200 mL/hr over 30 Minutes Intravenous Every 24 hours 05/10/19 0639     05/10/19 1000  remdesivir 100 mg in sodium chloride 0.9 % 100 mL IVPB     100 mg 200 mL/hr over 30 Minutes Intravenous Daily 05/09/19 0646 05/14/19 0959   05/10/19 0800  azithromycin (ZITHROMAX) 500 mg in sodium chloride 0.9 % 250 mL IVPB  Status:  Discontinued     500 mg 250 mL/hr over 60 Minutes Intravenous Every 24 hours 05/10/19 0639 05/11/19 0929   05/10/19 0645  cefTRIAXone (ROCEPHIN) 1 g in sodium chloride 0.9 % 100 mL IVPB     1 g 200 mL/hr over 30 Minutes Intravenous  Once 05/10/19 0641 05/10/19 0900   05/09/19 0730  remdesivir 100 mg in sodium chloride 0.9 % 100 mL IVPB     100 mg 200 mL/hr over 30 Minutes Intravenous Every 30 min 05/09/19 0646 05/09/19 1017   05/09/19 0145  azithromycin (ZITHROMAX) 500 mg in sodium chloride 0.9 % 250 mL IVPB     500 mg 250 mL/hr over 60 Minutes Intravenous  Once 05/09/19 0134 05/09/19 0326   05/09/19 0145  cefTRIAXone (ROCEPHIN) 1 g in sodium chloride 0.9 % 100 mL IVPB     1 g 200 mL/hr over 30  Minutes Intravenous  Once 05/09/19 0134 05/09/19 0221        Objective:   Vitals:   05/10/19 2156 05/11/19 0000 05/11/19 0409 05/11/19 0800  BP: 107/70 106/63 101/65 (!) 93/55  Pulse: (!) 53 (!) 52 (!) 53 (!) 52    Resp: (!) 21 19 18 18   Temp: 97.8 F (36.6 C) 98.5 F (36.9 C) 98.2 F (36.8 C) 97.8 F (36.6 C)  TempSrc: Oral Oral Oral Oral  SpO2: 92% 95% 94% 96%  Weight:      Height:        Wt Readings from Last 3 Encounters:  05/09/19 52.2 kg  05/08/19 52.3 kg  04/19/19 54.3 kg     Intake/Output Summary (Last 24 hours) at 05/11/2019 1259 Last data filed at 05/11/2019 1050 Gross per 24 hour  Intake 1852.82 ml  Output 300 ml  Net 1552.82 ml     Physical Exam  Awake Alert, Oriented X 3, frail, chronically ill-appearing, no new F.N deficits, Normal affect Symmetrical Chest wall movement, Good air movement bilaterally, CTAB RRR,No Gallops,Rubs or new Murmurs, No Parasternal Heave +ve B.Sounds, Abd Soft, No tenderness, No rebound - guarding or rigidity. No Cyanosis, Clubbing or edema, No new Rash or bruise       Data Review:    CBC Recent Labs  Lab 05/08/19 0854 05/08/19 2133 05/09/19 0618 05/10/19 0321 05/11/19 0308  WBC 2.9* 3.1* 2.0* 3.2* 2.4*  HGB 6.7* 7.6* 6.7* 8.8* 8.9*  HCT 19.5* 23.8* 21.6* 26.8* 27.1*  PLT 170 182 150 157 154  MCV 98* 103.9* 105.9* 97.8 97.5  MCH 33.5* 33.2 32.8 32.1 32.0  MCHC 34.4 31.9 31.0 32.8 32.8  RDW 21.9* 23.4* 23.0* 22.5* 21.9*    Chemistries  Recent Labs  Lab 05/08/19 0854 05/08/19 2133 05/09/19 1130 05/10/19 0321 05/11/19 0308  NA 138 135  --  136 139  K 4.4 4.3  --  5.0 3.9  CL 105 107  --  106 102  CO2 15* 15*  --  11* 21*  GLUCOSE 89 108*  --  106* 122*  BUN 82* 90*  --  92* 91*  CREATININE 4.59* 5.22*  --  4.99* 5.74*  CALCIUM 9.3 8.8*  --  9.1 8.7*  BILITOT  --   --  0.8  --   --    ------------------------------------------------------------------------------------------------------------------ No results for input(s): CHOL, HDL, LDLCALC, TRIG, CHOLHDL, LDLDIRECT in the last 72 hours.  Lab Results  Component Value Date   HGBA1C 4.8 05/08/2019    ------------------------------------------------------------------------------------------------------------------ No results for input(s): TSH, T4TOTAL, T3FREE, THYROIDAB in the last 72 hours.  Invalid input(s): FREET3 ------------------------------------------------------------------------------------------------------------------ Recent Labs    05/08/19 2112 05/08/19 2200  VITAMINB12  --  702  FOLATE  --  4.9*  FERRITIN  --  1,744*  TIBC  --  189*  IRON  --  100  RETICCTPCT 3.1  --     Coagulation profile No results for input(s): INR, PROTIME in the last 168 hours.  Recent Labs    05/09/19 0751  DDIMER 2.25*    Cardiac Enzymes No results for input(s): CKMB, TROPONINI, MYOGLOBIN in the last 168 hours.  Invalid input(s): CK ------------------------------------------------------------------------------------------------------------------    Component Value Date/Time   BNP 481.5 (H) 04/17/2019 1718    Inpatient Medications  Scheduled Meds: . vitamin C  500 mg Oral Daily  . aspirin EC  81 mg Oral Daily  . azaTHIOprine  100 mg Oral Daily  . [START ON 05/12/2019] azithromycin  500  mg Oral Daily  . folic acid  1 mg Oral Daily  . gabapentin  100 mg Oral BID  . heparin  5,000 Units Subcutaneous Q8H  . latanoprost  1 drop Left Eye QHS  . methylPREDNISolone (SOLU-MEDROL) injection  40 mg Intravenous Q12H  . midodrine  5 mg Oral BID WC  . pantoprazole  40 mg Oral Q1200  . tacrolimus  2 mg Oral QPM  . tacrolimus  3 mg Oral q morning - 10a  . zinc sulfate  220 mg Oral Daily   Continuous Infusions: . sodium chloride    . cefTRIAXone (ROCEPHIN)  IV 1 g (05/11/19 6734)  . remdesivir 100 mg in NS 100 mL 100 mg (05/11/19 1043)  . sodium chloride Stopped (05/09/19 0929)   PRN Meds:.acetaminophen, benzonatate, lidocaine, ondansetron (ZOFRAN) IV, oxyCODONE-acetaminophen **AND** oxyCODONE, promethazine **OR** promethazine **OR** promethazine  Micro Results Recent Results  (from the past 240 hour(s))  Blood culture (routine x 2)     Status: None (Preliminary result)   Collection Time: 05/09/19 12:21 AM   Specimen: BLOOD RIGHT HAND  Result Value Ref Range Status   Specimen Description BLOOD RIGHT HAND  Final   Special Requests   Final    BOTTLES DRAWN AEROBIC AND ANAEROBIC Blood Culture results may not be optimal due to an inadequate volume of blood received in culture bottles   Culture   Final    NO GROWTH 2 DAYS Performed at [facility], [place] [address] [place], [place] [address]    Report Status PENDING  Incomplete  SARS CORONAVIRUS 2 (TAT 6-24 HRS) Nasopharyngeal Nasopharyngeal Swab     Status: Abnormal   Collection Time: 05/09/19 12:25 AM   Specimen: Nasopharyngeal Swab  Result Value Ref Range Status   SARS Coronavirus 2 POSITIVE (A) NEGATIVE Final    Comment: RESULT CALLED TO, READ BACK BY AND VERIFIED WITH: RN OSORIO BAILEY AT 0620 ON 05/09/2019 BY MESSAN HOUEGNIFIO (NOTE) SARS-CoV-2 target nucleic acids are DETECTED. The SARS-CoV-2 RNA is generally detectable in upper and lower respiratory specimens during the acute phase of infection. Positive results are indicative of the presence of SARS-CoV-2 RNA. Clinical correlation with patient history and other diagnostic information is  necessary to determine patient infection status. Positive results do not rule out bacterial infection or co-infection with other viruses.  The expected result is Negative. Fact Sheet for Patients: SugarRoll.be Fact Sheet for Healthcare Providers: https://www.woods-mathews.com/ This test is not yet approved or cleared by the [place] FDA and  has been authorized for detection and/or diagnosis of SARS-CoV-2 by FDA under an Emergency Use Authorization (EUA). This EUA will remain  in effect (meaning this tes t can be used) for the duration of the COVID-19 declaration under Section 564(b)(1) of the Act, 21 U.S.C. section  360bbb-3(b)(1), unless the authorization is terminated or revoked sooner. Performed at [facility], [place] [address], [place], [place] [address]   Gram stain     Status: None   Collection Time: 05/09/19  5:26 AM   Specimen: Urine, Random  Result Value Ref Range Status   Specimen Description URINE, RANDOM  Final   Special Requests Immunocompromised  Final   Gram Stain   Final    WBC PRESENT,BOTH PMN AND MONONUCLEAR GRAM POSITIVE COCCI IN PAIRS GRAM VARIABLE ROD CYTOSPIN SMEAR Performed at [facility], [address] [place], [place] [address]    Report Status 05/09/2019 FINAL  Final  Urine culture     Status:  Abnormal   Collection Time: 05/09/19  5:26 AM   Specimen: Urine, Random  Result Value Ref Range Status   Specimen Description URINE, RANDOM  Final   Special Requests Immunocompromised  Final   Culture (A)  Final    10,000 COLONIES/mL DIPHTHEROIDS(CORYNEBACTERIUM SPECIES) Standardized susceptibility testing for this organism is not available. Performed at [facility], [place] [address], [place], [place] [address]    Report Status 05/10/2019 FINAL  Final  Blood culture (routine x 2)     Status: None (Preliminary result)   Collection Time: 05/09/19  6:18 AM   Specimen: BLOOD RIGHT HAND  Result Value Ref Range Status   Specimen Description BLOOD RIGHT HAND  Final   Special Requests   Final    BOTTLES DRAWN AEROBIC AND ANAEROBIC Blood Culture results may not be optimal due to an inadequate volume of blood received in culture bottles   Culture   Final    NO GROWTH 2 DAYS Performed at [facility], [place] [address] [place], [place] [address]    Report Status PENDING  Incomplete    Radiology Reports DG Chest 2 View  Result Date: 04/17/2019 CLINICAL DATA:  Dyspnea and weakness. EXAM: CHEST - 2 VIEW COMPARISON:  March 20, 2019. FINDINGS: There are stable mild cardiomegaly and superior vena cava stent graft placement. Increased interstitial opacity  projects on the bilateral infrahilar regions with mild bronchial wall thickening, but without obscuration of the diaphragm or heart borders. There is persistent interstitial edema. There is no pneumothorax or obvious pleural effusion. Aortic knob calcified atherosclerosis and postsurgical change of lower cervical ACDF are present. Surgical clips project on the upper abdomen. IMPRESSION: Increased bilateral infrahilar bronchitis with nonspecific small airways disease which may be inflammatory, infectious or aspiration-related. No pneumonia. Stable cardiomegaly and postoperative changes with subtle pulmonary edema but no obvious pleural effusion. Electronically Signed   By: Revonda Humphrey   On: 04/17/2019 15:09   US Renal Transplant w/Doppler  Result Date: 05/10/2019 CLINICAL DATA:  Acute kidney injury EXAM: ULTRASOUND OF RENAL TRANSPLANT WITH RENAL DOPPLER ULTRASOUND TECHNIQUE: Ultrasound examination of the renal transplant was performed with gray-scale, color and duplex doppler evaluation. COMPARISON:  August 12, 2017 FINDINGS: Transplant kidney location: RLQ Transplant Kidney: Renal measurements: 10.8 x 5.6 x 5 cm = volume: There is 147mL. Normal in size and parenchymal echogenicity. No evidence of mass or hydronephrosis. No peri-transplant fluid collection seen. Color flow in the main renal artery:  Yes Color flow in the main renal vein:  Yes Duplex Doppler Evaluation: Main Renal Artery Resistive Index: 0.82 Venous waveform in main renal vein:  Present Intrarenal resistive index in upper pole:  0.56 (normal 0.6-0.8; equivocal 0.8-0.9; abnormal >= 0.9) Intrarenal resistive index in lower pole: 0.55 (normal 0.6-0.8; equivocal 0.8-0.9; abnormal >= 0.9) Bladder: The bladder is decompressed which limits evaluation. Other findings:  None. IMPRESSION: 1. Renal transplant in the right lower quadrant without evidence for hydronephrosis. 2. Patent main renal artery and vein. 3. Relatively normal resistive indices without  significant change from study in 2019. Electronically Signed   By: Constance Holster M.D.   On: 05/10/2019 18:51   US RENAL  Result Date: 05/09/2019 CLINICAL DATA:  AKI, renal transplant EXAM: RENAL / URINARY TRACT ULTRASOUND COMPLETE COMPARISON:  None. FINDINGS: Kidneys: Native kidneys are absent surgically. Right lower quadrant transplant kidney measures 10.7 x 4.9 x 4.8 cm = volume 131.5 mL. No hydronephrosis. Bladder: Appears normal for degree of bladder distention. Other: None. IMPRESSION:  Bilateral nephrectomy with right lower quadrant transplant. No hydronephrosis. Electronically Signed   By: Macy Mis M.D.   On: 05/09/2019 15:10   DG Chest Portable 1 View  Result Date: 05/09/2019 CLINICAL DATA:  Hypotension. EXAM: PORTABLE CHEST 1 VIEW COMPARISON:  05/18/2019 FINDINGS: Cardiac silhouette is stable. Again noted is an SVC stent. There is vascular congestion without overt pulmonary edema. There is a new dense airspace opacity at the left lung base concerning for pneumonia or aspiration. There is no pneumothorax. No significant pleural effusion. No acute osseous abnormality. IMPRESSION: Findings concerning for developing left lower lobe pneumonia in the appropriate clinical setting. Electronically Signed   By: Constance Holster M.D.   On: 05/09/2019 00:13   DG Abd Portable 1V  Result Date: 05/10/2019 CLINICAL DATA:  Abdominal pain. EXAM: PORTABLE ABDOMEN - 1 VIEW COMPARISON:  Plain films of the abdomen 06/17/2015. FINDINGS: The bowel gas pattern is normal. No radio-opaque calculi or other significant radiographic abnormality are seen. Multiple surgical clips in the pelvis and upper abdomen are unchanged. IMPRESSION: No acute finding. Electronically Signed   By: Inge Rise M.D.   On: 05/10/2019 13:55      Phillips Climes M.D on 05/11/2019 at 12:59 PM  Between 7am to 7pm - Pager - [phone]  After 7pm go to [email or web address] - password [facility]  [facility] -  Office   [phone]

## 2019-05-11 NOTE — Evaluation (Signed)
Physical Therapy Evaluation Patient Details Name: Tina Watkins MRN: [id number] DOB: [birth year] Today's Date: 05/11/2019   History of Present Illness  Patient reports feeling poorly for 3 weeks with progressive dyspnea. CXR: LLL PNA. Patient given blood transfusion with good hemoglobin response. +COVID being treated wtih IV steroids and IV Remdesivir. IV Rocephin and Azithromycin to cover for bacterial infection also. Patient with anemia likely of chronic kidney disease as she has a history of ESRD s/p renal transplant. PMH: renal transplant x3, CKD, chronic diastolic CHF, chronic anemia, chronic hypotension, liver cirrhosis, HTN, TIA    Clinical Impression  Patient presents with decreased LE strength, impaired balance, tremulous initially upon standing, and is below her PLOF. Anticipate with continued skilled PT services in the hospital, patient will be able to progress her mobility in order to discharge home with home PT and intermittent supervision/assist. She would benefit from home health aide as well. Recommend OT consult.    Follow Up Recommendations Home health PT;Supervision - Intermittent    Equipment Recommendations  Rolling walker with 5" wheels       Precautions / Restrictions Precautions Precautions: Fall Restrictions Weight Bearing Restrictions: No      Mobility  Bed Mobility Overal bed mobility: Needs Assistance Bed Mobility: Supine to Sit     Supine to sit: HOB elevated;Supervision(use of bedrail)     General bed mobility comments: HOB approx 60 degrees  Transfers Overall transfer level: Needs assistance Equipment used: Rolling walker (2 wheeled);None Transfers: Sit to/from Stand Sit to Stand: Min guard         General transfer comment: Patient whole body tremulous initially upon standing, improved with use of RW and continued mobility  Ambulation/Gait Ambulation/Gait assistance: Min guard Gait Distance (Feet): 12 Feet Assistive device:  Rolling walker (2 wheeled) Gait Pattern/deviations: Step-through pattern;Decreased step length - right;Decreased step length - left Gait velocity: decreased   General Gait Details: Cues and assist for obstacle negotiation  Stairs Not assessed    Balance Overall balance assessment: Needs assistance Sitting-balance support: Feet supported Sitting balance-Leahy Scale: Good Sitting balance - Comments: Patient requesting assistance for donning socks. She states she wears house shoes at home.   Standing balance support: No upper extremity supported;Bilateral upper extremity supported Standing balance-Leahy Scale: (Fair-) Standing balance comment: tremulous initially upon standing      Pertinent Vitals/Pain Pain Assessment: 0-10 Pain Score: 9  Pain Location: chest and shoulders Pain Descriptors / Indicators: Aching Pain Intervention(s): Monitored during session;Limited activity within patient's tolerance;Other (comment)(patient with Lidoderm patches for pain mgmt)    Home Living Family/patient expects to be discharged to:: Private residence Living Arrangements: Alone Available Help at Discharge: Family;Available PRN/intermittently Type of Home: Apartment Home Access: Stairs to enter Entrance Stairs-Rails: Can reach both Entrance Stairs-Number of Steps: 7 Home Layout: One level Home Equipment: Shower seat;Cane - single point;Walker - 2 wheels;Other (comment)(adjustable bed)      Prior Function Level of Independence: Independent         Comments: moved slow at baseline, independent ADLs and IADLs, patient reports she has been asking her doctors at [facility] for help at home but has not received any        Extremity/Trunk Assessment        Lower Extremity Assessment Lower Extremity Assessment: RLE deficits/detail;LLE deficits/detail RLE Deficits / Details: R knee extension and hip flexion strength 3-/5, knee flexion strength 4/5, ankle 3-/5(decreased R ankle AROM by  approximately 25% of normal) LLE Deficits / Details: R knee extension and hip  flexion strength 3-/5, knee flexion strength 4/5, ankle 3-/5(decrease L ankle DF AROM by approximately 25%)       Communication   Communication: No difficulties  Cognition Arousal/Alertness: Lethargic;Awake/alert Behavior During Therapy: Flat affect Overall Cognitive Status: Within Functional Limits for tasks assessed    General Comments General comments (skin integrity, edema, etc.): Patient on room air. O2 sat 96% at rest, RR 20, HR 53, BP 101/65 in bed pre-mobility. Post-mobility: O2 sat 96%, HR 61 bpm, BP 106/68         Assessment/Plan    PT Assessment Patient needs continued PT services  PT Problem List Decreased strength;Decreased activity tolerance;Decreased balance;Decreased mobility;Decreased knowledge of use of DME       PT Treatment Interventions DME instruction;Gait training;Stair training;Functional mobility training;Therapeutic activities;Therapeutic exercise;Balance training;Patient/family education    PT Goals (Current goals can be found in the Care Plan section)  Acute Rehab PT Goals Time For Goal Achievement: 05/24/19 Potential to Achieve Goals: Good    Frequency Min 3X/week   Barriers to discharge Decreased caregiver support Patient lives alone.       AM-PAC PT "6 Clicks" Mobility  Outcome Measure Help needed turning from your back to your side while in a flat bed without using bedrails?: None Help needed moving from lying on your back to sitting on the side of a flat bed without using bedrails?: A Little Help needed moving to and from a bed to a chair (including a wheelchair)?: A Little Help needed standing up from a chair using your arms (e.g., wheelchair or bedside chair)?: A Little Help needed to walk in hospital room?: A Little Help needed climbing 3-5 steps with a railing? : A Lot 6 Click Score: 18    End of Session Equipment Utilized During Treatment: Gait  belt Activity Tolerance: Patient limited by fatigue;Patient limited by pain Patient left: in chair;with call bell/phone within reach;with chair alarm set Nurse Communication: Mobility status;Other (comment)(patient reporting IV bothering her) PT Visit Diagnosis: Unsteadiness on feet (R26.81);Other abnormalities of gait and mobility (R26.89)    Time: 9381-8299 PT Time Calculation (min) (ACUTE ONLY): 40 min   Charges:   PT Evaluation $PT Eval Moderate Complexity: 1 Mod          Birdie Hopes, PT, DPT Acute Rehab [phone] office    Birdie Hopes 05/11/2019, 9:52 AM

## 2019-05-11 NOTE — Progress Notes (Signed)
[facility] NEPHROLOGY PROGRESS NOTE  Assessment/ Plan: Pt is a 54 y.o. yo female with history of ESRD status post renal transplant x3, most recent in 08/2003, hypertension, TIA, heart failure, admitted with Covid pneumonia, seen for AKI on CKD. Home transplant medications: Prograf 3/2, azathioprine 100 daily, prednisone 5 daily.  #COVID-19 pneumonia: Currently on IV steroid, remdesivir.  Also receiving empiric IV Rocephin and azithromycin to cover bacterial infection because of immunocompromised status.  Per primary team.  #Acute kidney injury on CKD stage IV in transplant kidney likely due to Covid infection, hypotension: Urine contamination, minimal protein.  Kidney ultrasound without hydronephrosis of transplanted kidney.  Marginal urine output and serum creatinine level trending up.  I will discontinue sodium bicarbonate and switch to NS for next 18 hours.  Hopefully the kidney function will improve.  I have discussed with the patient that she may need dialysis if no improvement in her renal function.  Currently not uremic and volume status acceptable. Also discussed with her primary nephrologist Dr. Marval Regal.  #History of ESRD status post kidney transplant x3, continue Prograf, azathioprine.  Currently on IV steroid.  Follow-up Prograf level.  # Anemia of CKD and acute illness: Iron saturation acceptable.  Hemoccult negative.  Received Aranesp on 4/7.   Received a unit of blood transfusion.  #Hypotension: On midodrine.  Monitor blood pressure.  #Metabolic acidosis: Improved, off sodium bicarbonate.  Monitor.  Subjective: Seen and examined at bedside.  Urine output of only 300 cc.  Reports some nausea but no chest pain, shortness of breath, headache or dizziness.  Objective Vital signs in last 24 hours: Vitals:   05/10/19 2156 05/11/19 0000 05/11/19 0409 05/11/19 0800  BP: 107/70 106/63 101/65 (!) 93/55  Pulse: (!) 53 (!) 52 (!) 53 (!) 52  Resp: (!) 21 19 18 18    Temp: 97.8 F (36.6 C) 98.5 F (36.9 C) 98.2 F (36.8 C) 97.8 F (36.6 C)  TempSrc: Oral Oral Oral Oral  SpO2: 92% 95% 94% 96%  Weight:      Height:       Weight change:   Intake/Output Summary (Last 24 hours) at 05/11/2019 1246 Last data filed at 05/11/2019 1050 Gross per 24 hour  Intake 1852.82 ml  Output 300 ml  Net 1552.82 ml       Labs: Basic Metabolic Panel: Recent Labs  Lab 05/08/19 2133 05/10/19 0321 05/11/19 0308  NA 135 136 139  K 4.3 5.0 3.9  CL 107 106 102  CO2 15* 11* 21*  GLUCOSE 108* 106* 122*  BUN 90* 92* 91*  CREATININE 5.22* 4.99* 5.74*  CALCIUM 8.8* 9.1 8.7*   Liver Function Tests: Recent Labs  Lab 05/09/19 1130  BILITOT 0.8   No results for input(s): LIPASE, AMYLASE in the last 168 hours. No results for input(s): AMMONIA in the last 168 hours. CBC: Recent Labs  Lab 05/08/19 0854 05/08/19 2133 05/08/19 2133 05/09/19 0618 05/10/19 0321 05/11/19 0308  WBC 2.9* 3.1*   < > 2.0* 3.2* 2.4*  HGB 6.7* 7.6*   < > 6.7* 8.8* 8.9*  HCT 19.5* 23.8*   < > 21.6* 26.8* 27.1*  MCV 98* 103.9*  --  105.9* 97.8 97.5  PLT 170 182   < > 150 157 154   < > = values in this interval not displayed.   Cardiac Enzymes: No results for input(s): CKTOTAL, CKMB, CKMBINDEX, TROPONINI in the last 168 hours. CBG: Recent Labs  Lab 05/11/19 0845 05/11/19 1206  GLUCAP 117* 113*  Iron Studies:  Recent Labs    05/08/19 2200  IRON 100  TIBC 189*  FERRITIN 1,744*   Studies/Results: US Renal Transplant w/Doppler  Result Date: 05/10/2019 CLINICAL DATA:  Acute kidney injury EXAM: ULTRASOUND OF RENAL TRANSPLANT WITH RENAL DOPPLER ULTRASOUND TECHNIQUE: Ultrasound examination of the renal transplant was performed with gray-scale, color and duplex doppler evaluation. COMPARISON:  August 12, 2017 FINDINGS: Transplant kidney location: RLQ Transplant Kidney: Renal measurements: 10.8 x 5.6 x 5 cm = volume: There is 151mL. Normal in size and parenchymal echogenicity. No  evidence of mass or hydronephrosis. No peri-transplant fluid collection seen. Color flow in the main renal artery:  Yes Color flow in the main renal vein:  Yes Duplex Doppler Evaluation: Main Renal Artery Resistive Index: 0.82 Venous waveform in main renal vein:  Present Intrarenal resistive index in upper pole:  0.56 (normal 0.6-0.8; equivocal 0.8-0.9; abnormal >= 0.9) Intrarenal resistive index in lower pole: 0.55 (normal 0.6-0.8; equivocal 0.8-0.9; abnormal >= 0.9) Bladder: The bladder is decompressed which limits evaluation. Other findings:  None. IMPRESSION: 1. Renal transplant in the right lower quadrant without evidence for hydronephrosis. 2. Patent main renal artery and vein. 3. Relatively normal resistive indices without significant change from study in 2019. Electronically Signed   By: Constance Holster M.D.   On: 05/10/2019 18:51   US RENAL  Result Date: 05/09/2019 CLINICAL DATA:  AKI, renal transplant EXAM: RENAL / URINARY TRACT ULTRASOUND COMPLETE COMPARISON:  None. FINDINGS: Kidneys: Native kidneys are absent surgically. Right lower quadrant transplant kidney measures 10.7 x 4.9 x 4.8 cm = volume 131.5 mL. No hydronephrosis. Bladder: Appears normal for degree of bladder distention. Other: None. IMPRESSION: Bilateral nephrectomy with right lower quadrant transplant. No hydronephrosis. Electronically Signed   By: Macy Mis M.D.   On: 05/09/2019 15:10   DG Abd Portable 1V  Result Date: 05/10/2019 CLINICAL DATA:  Abdominal pain. EXAM: PORTABLE ABDOMEN - 1 VIEW COMPARISON:  Plain films of the abdomen 06/17/2015. FINDINGS: The bowel gas pattern is normal. No radio-opaque calculi or other significant radiographic abnormality are seen. Multiple surgical clips in the pelvis and upper abdomen are unchanged. IMPRESSION: No acute finding. Electronically Signed   By: Inge Rise M.D.   On: 05/10/2019 13:55    Medications: Infusions: . cefTRIAXone (ROCEPHIN)  IV 1 g (05/11/19 4174)  .  remdesivir 100 mg in NS 100 mL 100 mg (05/11/19 1043)  . sodium chloride Stopped (05/09/19 0929)    Scheduled Medications: . vitamin C  500 mg Oral Daily  . aspirin EC  81 mg Oral Daily  . azaTHIOprine  100 mg Oral Daily  . [START ON 05/12/2019] azithromycin  500 mg Oral Daily  . folic acid  1 mg Oral Daily  . gabapentin  100 mg Oral BID  . heparin  5,000 Units Subcutaneous Q8H  . latanoprost  1 drop Left Eye QHS  . methylPREDNISolone (SOLU-MEDROL) injection  40 mg Intravenous Q12H  . midodrine  5 mg Oral BID WC  . pantoprazole  40 mg Oral Q1200  . tacrolimus  2 mg Oral QPM  . tacrolimus  3 mg Oral q morning - 10a  . zinc sulfate  220 mg Oral Daily    have reviewed scheduled and prn medications.  Physical Exam: General:NAD, comfortable, able to lie flat. Heart:RRR, s1s2 nl Lungs:clear b/l, no crackle Abdomen:soft, no tenderness, non-distended Extremities:No LE edema Neurology: Alert awake and following commands, no asterixis  Kiyanna Biegler Tanna Furry 05/11/2019,12:46 PM  LOS: 2 days  Pager: [phone]

## 2019-05-11 NOTE — Plan of Care (Signed)

## 2019-05-12 ENCOUNTER — Inpatient Hospital Stay (HOSPITAL_COMMUNITY): Payer: Medicaid Other

## 2019-05-12 ENCOUNTER — Inpatient Hospital Stay (HOSPITAL_COMMUNITY): Admission: RE | Admit: 2019-05-12 | Payer: Medicaid Other | Source: Ambulatory Visit

## 2019-05-12 DIAGNOSIS — R109 Unspecified abdominal pain: Secondary | ICD-10-CM

## 2019-05-12 LAB — COMPREHENSIVE METABOLIC PANEL
ALT: 21 U/L (ref 0–44)
AST: 29 U/L (ref 15–41)
Albumin: 3.3 g/dL — ABNORMAL LOW (ref 3.5–5.0)
Alkaline Phosphatase: 107 U/L (ref 38–126)
Anion gap: 20 — ABNORMAL HIGH (ref 5–15)
BUN: 94 mg/dL — ABNORMAL HIGH (ref 6–20)
CO2: 20 mmol/L — ABNORMAL LOW (ref 22–32)
Calcium: 9 mg/dL (ref 8.9–10.3)
Chloride: 101 mmol/L (ref 98–111)
Creatinine, Ser: 5.62 mg/dL — ABNORMAL HIGH (ref 0.44–1.00)
GFR calc Af Amer: 9 mL/min — ABNORMAL LOW (ref >60)
GFR calc non Af Amer: 8 mL/min — ABNORMAL LOW (ref >60)
Glucose, Bld: 98 mg/dL (ref 70–99)
Potassium: 4.2 mmol/L (ref 3.5–5.1)
Sodium: 141 mmol/L (ref 135–145)
Total Bilirubin: 0.9 mg/dL (ref 0.3–1.2)
Total Protein: 5.6 g/dL — ABNORMAL LOW (ref 6.5–8.1)

## 2019-05-12 LAB — CBC
HCT: 30.5 % — ABNORMAL LOW (ref 36.0–46.0)
Hemoglobin: 9.7 g/dL — ABNORMAL LOW (ref 12.0–15.0)
MCH: 32.4 pg (ref 26.0–34.0)
MCHC: 31.8 g/dL (ref 30.0–36.0)
MCV: 102 fL — ABNORMAL HIGH (ref 80.0–100.0)
Platelets: 185 10*3/uL (ref 150–400)
RBC: 2.99 MIL/uL — ABNORMAL LOW (ref 3.87–5.11)
RDW: 22.1 % — ABNORMAL HIGH (ref 11.5–15.5)
WBC: 2.8 10*3/uL — ABNORMAL LOW (ref 4.0–10.5)
nRBC: 2.1 % — ABNORMAL HIGH (ref 0.0–0.2)

## 2019-05-12 LAB — TACROLIMUS LEVEL: Tacrolimus (FK506) - LabCorp: 28.3 ng/mL — ABNORMAL HIGH (ref 2.0–20.0)

## 2019-05-12 LAB — D-DIMER, QUANTITATIVE: D-Dimer, Quant: 3.05 ug/mL-FEU — ABNORMAL HIGH (ref 0.00–0.50)

## 2019-05-12 LAB — C-REACTIVE PROTEIN: CRP: 1.9 mg/dL — ABNORMAL HIGH (ref <1.0)

## 2019-05-12 MED ORDER — NYSTATIN 100000 UNIT/ML MT SUSP
5.0000 mL | Freq: Four times a day (QID) | OROMUCOSAL | Status: DC
  Administered 2019-05-12 – 2019-05-18 (×17): 500000 [IU] via ORAL
  Filled 2019-05-12 (×18): qty 5

## 2019-05-12 MED ORDER — SODIUM CHLORIDE 0.9 % IV SOLN: INTRAVENOUS | Status: AC

## 2019-05-12 NOTE — Progress Notes (Signed)
PROGRESS NOTE                                                                                                                                                                                                             Patient Demographics:    Tina Watkins, is a 54 y.o. female, DOB - 06-05-1965, FUX:[id number]  Admit date - 05/08/2019   Admitting Physician Domenic Polite, MD  Outpatient Primary MD for the patient is [name], FNP  LOS - 3   Chief Complaint  Patient presents with  . Low Hemoglobin  . Hypotension       Brief Narrative    This is a chronically ill 54 year old female with history of renal transplant x3, CKD 4/5, chronic diastolic CHF, chronic anemia, chronic hypotension, liver cirrhosis on imaging was sent to the ED by her cardiologist due to worsening anemia,worsening kidney function and hypotension. Patient reports feeling poorly for 3 weeks, progressive dyspnea on exertion.  She is followed closely by Dr. Marval Regal with [facility] and [facility] heart care. -While in the ED, she was afebrile, hypotensive with systolic blood pressure in the 70-80 range, hemoglobin was down to 6.7, Hemoccult negative, creatinine worsened at 5.2 from baseline of 3.5-3.7, in addition COVID-19 PCR was positive, chest x-ray notable for developing left lower lobe pneumonia, no evidence of hypoxemia. -No evidence of significant bleed, patient had nausea during first unit transfusion, so it has been continued, and she did receive another unit later, with good response and improvement of hemoglobin .    Subjective:    Tina Watkins today reports she is feeling better, still some nausea, but no vomiting, still reports some abdominal pain .   Assessment  & Plan :    Principal Problem:   Pneumonia due to COVID-19 virus Active Problems:   AKI (acute kidney injury) ([place])   Hypotension   Anemia   CHF (congestive heart failure)  ([place])   COVID-19 for pneumonia . -Chest x-ray significant for left lung opacity . -There is no evidence of hypoxia . -Continue to trend inflammatory markers closely .  D-dimers elevated at 3.05, is related to Covid. -Continue with IV steroids(change hydrocortisone to Solu-Medrol). -Continue with IV remdesivir . -Procalcitonin is elevated, patient is immunocompromise, continue with  IV Rocephin and azithromycin to cover for bacterial infection as well . -  She was encouraged with incentive spirometry . COVID-19 Labs  Recent Labs    05/12/19 0932  DDIMER 3.05*  CRP 1.9*    Lab Results  Component Value Date   SARSCOV2NAA POSITIVE (A) 05/09/2019   [place] NEGATIVE 03/17/2019     Hypotension: -Acute on chronic, multifactorial secondary to anemia, volume depletion and dehydration. -Started on low-dose midodrine -Received appropriate volume resuscitation with IV fluids and lesions. -Steroid-dependent, initially on IV hydrocortisone, currently on IV Solu-Medrol  Anemia of chronic kidney disease -No evidence of significant blood loss, she is Hemoccult negative, this is most likely anemia of chronic kidney disease. -Hemoglobin 6.7 on presentation, responded to 1 unit PRBC transfusion is 8.9 today -procrit per renal.  Abdominal pain/nausea/vomiting. -Nausea and vomiting most likely related to Covid, no further vomiting, nausea has improved, still reports some abdominal pain, will obtain CT abdomen pelvis without contrast for further evaluation.  AKI on CKD stage IV in transplant Kidney :  - History of ESRD status post renal transplant complicated by CKD stage IV on chronic steroid and immunosuppressive therapy.   - Creatinine was ranging between 3.5-3.7 on recent labs, continue to increase, it is 5.7 today, management per renal . -May need dialysis if creatinine continues to increase . - continue with immunosuppressive therapy. -Normal protein, kidney ultrasound without  hydronephrosis of transplanted kidney.  Metabolic acidosis -Resolved with bicarb drip.  Chronic diastolic congestive heart failure:  - No signs of volume overload at this time.  Hold diuretic given hypotension.  Oral thrush -She is on steroids, increased her nystatin mouthwash.   COVID-19 Labs  Recent Labs    05/12/19 6712  DDIMER 3.05*  CRP 1.9*    Lab Results  Component Value Date   SARSCOV2NAA POSITIVE (A) 05/09/2019   [place] NEGATIVE 03/17/2019     Code Status : Inpatient  Family Communication  : D/W mother via phone 4/7.  Disposition Plan  : home with HH.  Barriers For Discharge : On iv fluids,  IV antibiotic, remdesivir and steroids, worsening renal function  Consults  :  renal  Procedures  : none  DVT Prophylaxis  : [place] heparin  Lab Results  Component Value Date   PLT 185 05/12/2019    Antibiotics  :    Anti-infectives (From admission, onward)   Start     Dose/Rate Route Frequency Ordered Stop   05/12/19 1000  azithromycin (ZITHROMAX) tablet 500 mg     500 mg Oral Daily 05/11/19 0929     05/11/19 0600  cefTRIAXone (ROCEPHIN) 1 g in sodium chloride 0.9 % 100 mL IVPB     1 g 200 mL/hr over 30 Minutes Intravenous Every 24 hours 05/10/19 0639     05/10/19 1000  remdesivir 100 mg in sodium chloride 0.9 % 100 mL IVPB     100 mg 200 mL/hr over 30 Minutes Intravenous Daily 05/09/19 0646 05/14/19 0959   05/10/19 0800  azithromycin (ZITHROMAX) 500 mg in sodium chloride 0.9 % 250 mL IVPB  Status:  Discontinued     500 mg 250 mL/hr over 60 Minutes Intravenous Every 24 hours 05/10/19 0639 05/11/19 0929   05/10/19 0645  cefTRIAXone (ROCEPHIN) 1 g in sodium chloride 0.9 % 100 mL IVPB     1 g 200 mL/hr over 30 Minutes Intravenous  Once 05/10/19 0641 05/10/19 0900   05/09/19 0730  remdesivir 100 mg in sodium chloride 0.9 % 100 mL IVPB     100 mg 200 mL/hr over 30 Minutes Intravenous Every  30 min 05/09/19 0646 05/09/19 1017   05/09/19 0145  azithromycin  (ZITHROMAX) 500 mg in sodium chloride 0.9 % 250 mL IVPB     500 mg 250 mL/hr over 60 Minutes Intravenous  Once 05/09/19 0134 05/09/19 0326   05/09/19 0145  cefTRIAXone (ROCEPHIN) 1 g in sodium chloride 0.9 % 100 mL IVPB     1 g 200 mL/hr over 30 Minutes Intravenous  Once 05/09/19 0134 05/09/19 0221        Objective:   Vitals:   05/12/19 0000 05/12/19 0400 05/12/19 0745 05/12/19 1151  BP: 105/82 102/60 (!) 100/59 (!) 108/57  Pulse: 60 (!) 51 (!) 53 (!) 51  Resp: (!) 23 20 20 19   Temp: 97.7 F (36.5 C) 97.8 F (36.6 C) 97.8 F (36.6 C) 98.2 F (36.8 C)  TempSrc: Oral Axillary Oral Oral  SpO2: 94% 93% 91% 91%  Weight:      Height:        Wt Readings from Last 3 Encounters:  05/09/19 52.2 kg  05/08/19 52.3 kg  04/19/19 54.3 kg     Intake/Output Summary (Last 24 hours) at 05/12/2019 1428 Last data filed at 05/12/2019 1400 Gross per 24 hour  Intake 2070.99 ml  Output 582 ml  Net 1488.99 ml     Physical Exam  Awake Alert, Oriented X 3, No new F.N deficits, Normal affect,  Symmetrical Chest wall movement, Good air movement bilaterally, CTAB RRR,No Gallops,Rubs or new Murmurs, No Parasternal Heave +ve B.Sounds, Abd Soft, mild diffuse tenderness, no rebound - guarding or rigidity. No Cyanosis, Clubbing or edema, No new Rash or bruise        Data Review:    CBC Recent Labs  Lab 05/08/19 2133 05/09/19 0618 05/10/19 0321 05/11/19 0308 05/12/19 0633  WBC 3.1* 2.0* 3.2* 2.4* 2.8*  HGB 7.6* 6.7* 8.8* 8.9* 9.7*  HCT 23.8* 21.6* 26.8* 27.1* 30.5*  PLT 182 150 157 154 185  MCV 103.9* 105.9* 97.8 97.5 102.0*  MCH 33.2 32.8 32.1 32.0 32.4  MCHC 31.9 31.0 32.8 32.8 31.8  RDW 23.4* 23.0* 22.5* 21.9* 22.1*    Chemistries  Recent Labs  Lab 05/08/19 0854 05/08/19 2133 05/09/19 1130 05/10/19 0321 05/11/19 0308 05/12/19 0633  NA 138 135  --  136 139 141  K 4.4 4.3  --  5.0 3.9 4.2  CL 105 107  --  106 102 101  CO2 15* 15*  --  11* 21* 20*  GLUCOSE 89 108*  --   106* 122* 98  BUN 82* 90*  --  92* 91* 94*  CREATININE 4.59* 5.22*  --  4.99* 5.74* 5.62*  CALCIUM 9.3 8.8*  --  9.1 8.7* 9.0  AST  --   --   --   --   --  29  ALT  --   --   --   --   --  21  ALKPHOS  --   --   --   --   --  107  BILITOT  --   --  0.8  --   --  0.9   ------------------------------------------------------------------------------------------------------------------ No results for input(s): CHOL, HDL, LDLCALC, TRIG, CHOLHDL, LDLDIRECT in the last 72 hours.  Lab Results  Component Value Date   HGBA1C 4.8 05/08/2019   ------------------------------------------------------------------------------------------------------------------ No results for input(s): TSH, T4TOTAL, T3FREE, THYROIDAB in the last 72 hours.  Invalid input(s): FREET3 ------------------------------------------------------------------------------------------------------------------ No results for input(s): VITAMINB12, FOLATE, FERRITIN, TIBC, IRON, RETICCTPCT in the last 72 hours.  Coagulation profile No results for input(s): INR, PROTIME in the last 168 hours.  Recent Labs    05/12/19 0633  DDIMER 3.05*    Cardiac Enzymes No results for input(s): CKMB, TROPONINI, MYOGLOBIN in the last 168 hours.  Invalid input(s): CK ------------------------------------------------------------------------------------------------------------------    Component Value Date/Time   BNP 481.5 (H) 04/17/2019 1718    Inpatient Medications  Scheduled Meds: . vitamin C  500 mg Oral Daily  . aspirin EC  81 mg Oral Daily  . azaTHIOprine  100 mg Oral Daily  . azithromycin  500 mg Oral Daily  . folic acid  1 mg Oral Daily  . gabapentin  100 mg Oral BID  . heparin  5,000 Units Subcutaneous Q8H  . latanoprost  1 drop Left Eye QHS  . methylPREDNISolone (SOLU-MEDROL) injection  40 mg Intravenous Q12H  . midodrine  5 mg Oral BID WC  . nystatin  5 mL Oral QID  . pantoprazole  40 mg Oral Q1200  . tacrolimus  2 mg Oral  QPM  . tacrolimus  3 mg Oral q morning - 10a  . zinc sulfate  220 mg Oral Daily   Continuous Infusions: . sodium chloride 100 mL/hr at 05/12/19 0934  . cefTRIAXone (ROCEPHIN)  IV 1 g (05/12/19 8003)  . remdesivir 100 mg in NS 100 mL 100 mg (05/12/19 0933)  . sodium chloride Stopped (05/09/19 0929)   PRN Meds:.acetaminophen, benzonatate, lidocaine, ondansetron (ZOFRAN) IV, oxyCODONE-acetaminophen **AND** oxyCODONE, promethazine **OR** promethazine **OR** promethazine  Micro Results Recent Results (from the past 240 hour(s))  Blood culture (routine x 2)     Status: None (Preliminary result)   Collection Time: 05/09/19 12:21 AM   Specimen: BLOOD RIGHT HAND  Result Value Ref Range Status   Specimen Description BLOOD RIGHT HAND  Final   Special Requests   Final    BOTTLES DRAWN AEROBIC AND ANAEROBIC Blood Culture results may not be optimal due to an inadequate volume of blood received in culture bottles   Culture   Final    NO GROWTH 3 DAYS Performed at [facility], [place] [address] [place], [place] [address]    Report Status PENDING  Incomplete  SARS CORONAVIRUS 2 (TAT 6-24 HRS) Nasopharyngeal Nasopharyngeal Swab     Status: Abnormal   Collection Time: 05/09/19 12:25 AM   Specimen: Nasopharyngeal Swab  Result Value Ref Range Status   SARS Coronavirus 2 POSITIVE (A) NEGATIVE Final    Comment: RESULT CALLED TO, READ BACK BY AND VERIFIED WITH: RN OSORIO BAILEY AT 0620 ON 05/09/2019 BY MESSAN HOUEGNIFIO (NOTE) SARS-CoV-2 target nucleic acids are DETECTED. The SARS-CoV-2 RNA is generally detectable in upper and lower respiratory specimens during the acute phase of infection. Positive results are indicative of the presence of SARS-CoV-2 RNA. Clinical correlation with patient history and other diagnostic information is  necessary to determine patient infection status. Positive results do not rule out bacterial infection or co-infection with other viruses.  The expected result is  Negative. Fact Sheet for Patients: SugarRoll.be Fact Sheet for Healthcare Providers: https://www.woods-mathews.com/ This test is not yet approved or cleared by the [place] FDA and  has been authorized for detection and/or diagnosis of SARS-CoV-2 by FDA under an Emergency Use Authorization (EUA). This EUA will remain  in effect (meaning this tes t can be used) for the duration of the COVID-19 declaration under Section 564(b)(1) of the Act, 21 U.S.C. section 360bbb-3(b)(1), unless the authorization is terminated or revoked sooner. Performed at [facility], [place] [address] [place], [place] [address]   Gram stain     Status: None   Collection Time: 05/09/19  5:26 AM   Specimen: Urine, Random  Result Value Ref Range Status   Specimen Description URINE, RANDOM  Final   Special Requests Immunocompromised  Final   Gram Stain   Final    WBC PRESENT,BOTH PMN AND MONONUCLEAR GRAM POSITIVE COCCI IN PAIRS GRAM VARIABLE ROD CYTOSPIN SMEAR Performed at [facility], [address] [place], [place] [address]    Report Status 05/09/2019 FINAL  Final  Urine culture     Status: Abnormal   Collection Time: 05/09/19  5:26 AM   Specimen: Urine, Random  Result Value Ref Range Status   Specimen Description URINE, RANDOM  Final   Special Requests Immunocompromised  Final   Culture (A)  Final    10,000 COLONIES/mL DIPHTHEROIDS(CORYNEBACTERIUM SPECIES) Standardized susceptibility testing for this organism is not available. Performed at [facility], [place] [address], [place], [place] [address]    Report Status 05/10/2019 FINAL  Final  Blood culture (routine x 2)     Status: None (Preliminary result)   Collection Time: 05/09/19  6:18 AM   Specimen: BLOOD RIGHT HAND  Result Value Ref Range Status   Specimen Description BLOOD RIGHT HAND  Final   Special Requests   Final    BOTTLES DRAWN AEROBIC AND ANAEROBIC Blood Culture results may  not be optimal due to an inadequate volume of blood received in culture bottles   Culture   Final    NO GROWTH 3 DAYS Performed at [facility], [place] [address] [place], [place] [address]    Report Status PENDING  Incomplete    Radiology Reports DG Chest 2 View  Result Date: 04/17/2019 CLINICAL DATA:  Dyspnea and weakness. EXAM: CHEST - 2 VIEW COMPARISON:  March 20, 2019. FINDINGS: There are stable mild cardiomegaly and superior vena cava stent graft placement. Increased interstitial opacity projects on the bilateral infrahilar regions with mild bronchial wall thickening, but without obscuration of the diaphragm or heart borders. There is persistent interstitial edema. There is no pneumothorax or obvious pleural effusion. Aortic knob calcified atherosclerosis and postsurgical change of lower cervical ACDF are present. Surgical clips project on the upper abdomen. IMPRESSION: Increased bilateral infrahilar bronchitis with nonspecific small airways disease which may be inflammatory, infectious or aspiration-related. No pneumonia. Stable cardiomegaly and postoperative changes with subtle pulmonary edema but no obvious pleural effusion. Electronically Signed   By: Revonda Humphrey   On: 04/17/2019 15:09   US Renal Transplant w/Doppler  Result Date: 05/10/2019 CLINICAL DATA:  Acute kidney injury EXAM: ULTRASOUND OF RENAL TRANSPLANT WITH RENAL DOPPLER ULTRASOUND TECHNIQUE: Ultrasound examination of the renal transplant was performed with gray-scale, color and duplex doppler evaluation. COMPARISON:  August 12, 2017 FINDINGS: Transplant kidney location: RLQ Transplant Kidney: Renal measurements: 10.8 x 5.6 x 5 cm = volume: There is 126mL. Normal in size and parenchymal echogenicity. No evidence of mass or hydronephrosis. No peri-transplant fluid collection seen. Color flow in the main renal artery:  Yes Color flow in the main renal vein:  Yes Duplex Doppler Evaluation: Main Renal Artery Resistive Index:  0.82 Venous waveform in main renal vein:  Present Intrarenal resistive index in upper pole:  0.56 (normal 0.6-0.8; equivocal 0.8-0.9; abnormal >= 0.9) Intrarenal resistive index in lower pole: 0.55 (normal 0.6-0.8; equivocal 0.8-0.9; abnormal >= 0.9) Bladder: The bladder is decompressed which limits evaluation. Other findings:  None. IMPRESSION: 1. Renal transplant in the right lower quadrant without evidence for hydronephrosis. 2. Patent main renal artery and vein. 3. Relatively normal resistive indices without significant change from study in 2019. Electronically Signed   By: Constance Holster M.D.   On: 05/10/2019 18:51   US RENAL  Result Date: 05/09/2019 CLINICAL DATA:  AKI, renal transplant EXAM: RENAL / URINARY TRACT ULTRASOUND COMPLETE COMPARISON:  None. FINDINGS: Kidneys: Native kidneys are absent surgically. Right lower quadrant transplant kidney measures 10.7 x 4.9 x 4.8 cm = volume 131.5 mL. No hydronephrosis. Bladder: Appears normal for degree of bladder distention. Other: None. IMPRESSION: Bilateral nephrectomy with right lower quadrant transplant. No hydronephrosis. Electronically Signed   By: Macy Mis M.D.   On: 05/09/2019 15:10   DG Chest Portable 1 View  Result Date: 05/09/2019 CLINICAL DATA:  Hypotension. EXAM: PORTABLE CHEST 1 VIEW COMPARISON:  05/18/2019 FINDINGS: Cardiac silhouette is stable. Again noted is an SVC stent. There is vascular congestion without overt pulmonary edema. There is a new dense airspace opacity at the left lung base concerning for pneumonia or aspiration. There is no pneumothorax. No significant pleural effusion. No acute osseous abnormality. IMPRESSION: Findings concerning for developing left lower lobe pneumonia in the appropriate clinical setting. Electronically Signed   By: Constance Holster M.D.   On: 05/09/2019 00:13   DG Abd Portable 1V  Result Date: 05/10/2019 CLINICAL DATA:  Abdominal pain. EXAM: PORTABLE ABDOMEN - 1 VIEW COMPARISON:  Plain films  of the abdomen 06/17/2015. FINDINGS: The bowel gas pattern is normal. No radio-opaque calculi or other significant radiographic abnormality are seen. Multiple surgical clips in the pelvis and upper abdomen are unchanged. IMPRESSION: No acute finding. Electronically Signed   By: Inge Rise M.D.   On: 05/10/2019 13:55      Phillips Climes M.D on 05/12/2019 at 2:28 PM  Between 7am to 7pm - Pager - [phone]  After 7pm go to [email or web address] - password [email or web address]  [facility] -  Office  [phone]

## 2019-05-12 NOTE — Progress Notes (Signed)
Tina Watkins NEPHROLOGY PROGRESS NOTE  Assessment/ Plan: Pt is a 54 y.o. yo female with history of ESRD status post renal transplant x3, most recent in 08/2003, hypertension, TIA, heart failure, admitted with Covid pneumonia, seen for AKI on CKD. Home transplant medications: Prograf 3/2, azathioprine 100 daily, prednisone 5 daily.  #COVID-19 pneumonia: Currently on IV steroid, remdesivir.  Also receiving empiric IV Rocephin and azithromycin to cover bacterial infection because of immunocompromised status.  Per primary team.  #Acute kidney injury on CKD stage IV in transplant kidney likely due to Covid infection, hypotension: Urine contaminated, minimal protein.  Kidney ultrasound without hydronephrosis of transplanted kidney.  Urine output increasing and creatinine level is plateaued today.  Volume status looks acceptable and has no features of uremia.  I will continue gentle IVF for another day expecting renal function recovery.   I have discussed with the patient that she may need dialysis if no improvement in her renal function.  Currently not uremic and volume status acceptable. Also discussed with her primary nephrologist Dr. Marval Regal.  #History of ESRD status post kidney transplant x3, continue Prograf, azathioprine.  Currently on IV steroid.  Follow-up Prograf level.  # Anemia of CKD and acute illness: Iron saturation acceptable.  Hemoccult negative.  Received Aranesp on 4/7.   Received a unit of blood transfusion.  #Hypotension: On midodrine.  Monitor blood pressure.  #Metabolic acidosis: Start oral sodium bicarbonate.  Monitor.  #Nausea vomiting and abdominal pain: She does have diffuse abdomen tenderness.  Her lactic acid level is not elevated.  Abdominal x-ray unremarkable.  Discussed with primary team, considering CT abdomen pelvis.  Continue supportive care.  Subjective: Complaining of nausea vomiting and abdominal pain.  Urine output noted 855 cc.  Denies chest  pain, shortness of breath.  No new event. Objective Vital signs in last 24 hours: Vitals:   05/11/19 2000 05/12/19 0000 05/12/19 0400 05/12/19 0745  BP: 108/63 105/82 102/60 (!) 100/59  Pulse: (!) 54 60 (!) 51 (!) 53  Resp: (!) 21 (!) 23 20 20   Temp: 98 F (36.7 C) 97.7 F (36.5 C) 97.8 F (36.6 C) 97.8 F (36.6 C)  TempSrc: Oral Oral Axillary Oral  SpO2: 93% 94% 93% 91%  Weight:      Height:       Weight change:   Intake/Output Summary (Last 24 hours) at 05/12/2019 1101 Last data filed at 05/12/2019 0700 Gross per 24 hour  Intake 1470.99 ml  Output 556 ml  Net 914.99 ml       Labs: Basic Metabolic Panel: Recent Labs  Lab 05/10/19 0321 05/11/19 0308 05/12/19 0633  NA 136 139 141  K 5.0 3.9 4.2  CL 106 102 101  CO2 11* 21* 20*  GLUCOSE 106* 122* 98  BUN 92* 91* 94*  CREATININE 4.99* 5.74* 5.62*  CALCIUM 9.1 8.7* 9.0   Liver Function Tests: Recent Labs  Lab 05/09/19 1130 05/12/19 0633  AST  --  29  ALT  --  21  ALKPHOS  --  107  BILITOT 0.8 0.9  PROT  --  5.6*  ALBUMIN  --  3.3*   No results for input(s): LIPASE, AMYLASE in the last 168 hours. No results for input(s): AMMONIA in the last 168 hours. CBC: Recent Labs  Lab 05/08/19 2133 05/08/19 2133 05/09/19 0618 05/09/19 0618 05/10/19 0321 05/11/19 0308 05/12/19 0633  WBC 3.1*   < > 2.0*   < > 3.2* 2.4* 2.8*  HGB 7.6*   < >  6.7*   < > 8.8* 8.9* 9.7*  HCT 23.8*   < > 21.6*   < > 26.8* 27.1* 30.5*  MCV 103.9*  --  105.9*  --  97.8 97.5 102.0*  PLT 182   < > 150   < > 157 154 185   < > = values in this interval not displayed.   Cardiac Enzymes: No results for input(s): CKTOTAL, CKMB, CKMBINDEX, TROPONINI in the last 168 hours. CBG: Recent Labs  Lab 05/11/19 0845 05/11/19 1206  GLUCAP 117* 113*    Iron Studies:  No results for input(s): IRON, TIBC, TRANSFERRIN, FERRITIN in the last 72 hours. Studies/Results: US Renal Transplant w/Doppler  Result Date: 05/10/2019 CLINICAL DATA:  Acute  kidney injury EXAM: ULTRASOUND OF RENAL TRANSPLANT WITH RENAL DOPPLER ULTRASOUND TECHNIQUE: Ultrasound examination of the renal transplant was performed with gray-scale, color and duplex doppler evaluation. COMPARISON:  August 12, 2017 FINDINGS: Transplant kidney location: RLQ Transplant Kidney: Renal measurements: 10.8 x 5.6 x 5 cm = volume: There is 153mL. Normal in size and parenchymal echogenicity. No evidence of mass or hydronephrosis. No peri-transplant fluid collection seen. Color flow in the main renal artery:  Yes Color flow in the main renal vein:  Yes Duplex Doppler Evaluation: Main Renal Artery Resistive Index: 0.82 Venous waveform in main renal vein:  Present Intrarenal resistive index in upper pole:  0.56 (normal 0.6-0.8; equivocal 0.8-0.9; abnormal >= 0.9) Intrarenal resistive index in lower pole: 0.55 (normal 0.6-0.8; equivocal 0.8-0.9; abnormal >= 0.9) Bladder: The bladder is decompressed which limits evaluation. Other findings:  None. IMPRESSION: 1. Renal transplant in the right lower quadrant without evidence for hydronephrosis. 2. Patent main renal artery and vein. 3. Relatively normal resistive indices without significant change from study in 2019. Electronically Signed   By: Constance Holster M.D.   On: 05/10/2019 18:51   DG Abd Portable 1V  Result Date: 05/10/2019 CLINICAL DATA:  Abdominal pain. EXAM: PORTABLE ABDOMEN - 1 VIEW COMPARISON:  Plain films of the abdomen 06/17/2015. FINDINGS: The bowel gas pattern is normal. No radio-opaque calculi or other significant radiographic abnormality are seen. Multiple surgical clips in the pelvis and upper abdomen are unchanged. IMPRESSION: No acute finding. Electronically Signed   By: Inge Rise M.D.   On: 05/10/2019 13:55    Medications: Infusions: . sodium chloride 100 mL/hr at 05/12/19 0934  . cefTRIAXone (ROCEPHIN)  IV 1 g (05/12/19 3716)  . remdesivir 100 mg in NS 100 mL 100 mg (05/12/19 0933)  . sodium chloride Stopped (05/09/19  0929)    Scheduled Medications: . vitamin C  500 mg Oral Daily  . aspirin EC  81 mg Oral Daily  . azaTHIOprine  100 mg Oral Daily  . azithromycin  500 mg Oral Daily  . folic acid  1 mg Oral Daily  . gabapentin  100 mg Oral BID  . heparin  5,000 Units Subcutaneous Q8H  . latanoprost  1 drop Left Eye QHS  . methylPREDNISolone (SOLU-MEDROL) injection  40 mg Intravenous Q12H  . midodrine  5 mg Oral BID WC  . nystatin  5 mL Oral QID  . pantoprazole  40 mg Oral Q1200  . tacrolimus  2 mg Oral QPM  . tacrolimus  3 mg Oral q morning - 10a  . zinc sulfate  220 mg Oral Daily    have reviewed scheduled and prn medications.  Physical Exam: General:NAD, comfortable, able to lie flat. Heart:RRR, s1s2 nl Lungs:clear b/l, no crackle Abdomen:soft, mild diffuse tenderness, non-distended  Extremities:No LE edema Neurology: Alert awake and following commands, no asterixis  Tina Watkins 05/12/2019,11:01 AM  LOS: 3 days  Pager: [phone]

## 2019-05-12 NOTE — Progress Notes (Signed)
Occupational Therapy Evaluation Patient Details Name: Tina Watkins MRN: [id number] DOB: 1965-11-14 Today's Date: 05/12/2019    History of Present Illness Patient reports feeling poorly for 3 weeks with progressive dyspnea. CXR: LLL PNA. Patient given blood transfusion with good hemoglobin response. +COVID being treated wtih IV steroids and IV Remdesivir. IV Rocephin and Azithromycin to cover for bacterial infection also. Patient with anemia likely of chronic kidney disease as she has a history of ESRD s/p renal transplant. PMH: renal transplant x3, CKD, chronic diastolic CHF, chronic anemia, chronic hypotension, liver cirrhosis, HTN, TIA   Clinical Impression   PTA, pt lives alone at home and was Independent with ADLs, IADLs, and mobility. Pt reports daily tasks take time and have been becoming increasingly difficult. Pt presents today with increased stomach pain and nausea, but agreeable to attempt OOB activities. Pt Min A for bed mobility to sit EOB, Mod A to advance B LEs back to bed due to stomach pain. Pt Max A to don socks sitting EOB, but reports she typically wears slippers at home. Pt min guard for sit to stand transfers with RW, minor cues for hand placement. Pt min guard for RW stand pivot bed <> recliner with cues for RW mgmt. VSS on RA. Suspect pt will be able to improve functional abilities to return home when stomach pain decreases, but will update recommendations as appropriate.     Follow Up Recommendations  Home health OT;Supervision/Assistance - 24 hour    Equipment Recommendations  Other (comment)(TBD)    Recommendations for Other Services       Precautions / Restrictions Precautions Precautions: Fall;Other (comment) Precaution Comments: COVID+ Restrictions Weight Bearing Restrictions: No      Mobility Bed Mobility Overal bed mobility: Needs Assistance Bed Mobility: Supine to Sit;Sit to Supine     Supine to sit: HOB elevated;Min assist Sit to supine:  Mod assist   General bed mobility comments: Min A to sit EOB with HHA needed to advance trunk. Mod A to advance B LE into bed  Transfers Overall transfer level: Needs assistance Equipment used: Rolling walker (2 wheeled);None Transfers: Sit to/from [employer] to Stand: Min guard Stand pivot transfers: Min guard       General transfer comment: tremors noted in standing, cues needed for RW mgmt     Balance Overall balance assessment: Needs assistance Sitting-balance support: Feet supported Sitting balance-Leahy Scale: Good     Standing balance support: Bilateral upper extremity supported Standing balance-Leahy Scale: Fair                             ADL either performed or assessed with clinical judgement   ADL Overall ADL's : Needs assistance/impaired Eating/Feeding: Independent;Sitting   Grooming: Min guard;Standing   Upper Body Bathing: Set up;Sitting   Lower Body Bathing: Minimal assistance;Sit to/from stand   Upper Body Dressing : Set up;Sitting   Lower Body Dressing: Moderate assistance;Sit to/from stand;Sitting/lateral leans   Toilet Transfer: Min guard;Stand-pivot   Toileting- Clothing Manipulation and Hygiene: Minimal assistance;Sit to/from stand         General ADL Comments: Pt limited by stomach pain and nausea today. Pt with overall generalized weakness that impacts ability to safely complete daily tasks      Vision         Perception     Praxis      Pertinent Vitals/Pain Pain Assessment: Faces Faces Pain Scale: Hurts even more Pain Location:  stomach Pain Descriptors / Indicators: Aching Pain Intervention(s): Limited activity within patient's tolerance;Monitored during session(Notified RN)     Hand Dominance Right   Extremity/Trunk Assessment Upper Extremity Assessment Upper Extremity Assessment: Generalized weakness   Lower Extremity Assessment Lower Extremity Assessment: Defer to PT evaluation        Communication Communication Communication: No difficulties   Cognition Arousal/Alertness: Lethargic;Awake/alert Behavior During Therapy: Flat affect Overall Cognitive Status: Within Functional Limits for tasks assessed                                     General Comments  VSS on RA. 119/64 BP reading after activity, no reports of dizziness    Exercises     Shoulder Instructions      Home Living Family/patient expects to be discharged to:: Private residence Living Arrangements: Alone Available Help at Discharge: Family;Available PRN/intermittently Type of Home: Apartment Home Access: Stairs to enter Entrance Stairs-Number of Steps: 7 Entrance Stairs-Rails: Can reach both Home Layout: One level     Bathroom Shower/Tub: Teacher, early years/pre: Standard     Home Equipment: Shower seat;Cane - single point;Walker - 2 wheels;Other (comment)(adjustable bed)          Prior Functioning/Environment Level of Independence: Independent(but reports increasing difficulties with daily tasks )        Comments: Pt Independent at home, but reports increasing dificulty completing daily tasks. Pt reports previously requesting [place] aide assistance         OT Problem List: Decreased strength;Decreased activity tolerance;Impaired balance (sitting and/or standing);Decreased knowledge of use of DME or AE      OT Treatment/Interventions: Self-care/ADL training;Therapeutic exercise;Energy conservation;DME and/or AE instruction;Therapeutic activities;Patient/family education    OT Goals(Current goals can be found in the care plan section) Acute Rehab OT Goals Patient Stated Goal: feel better OT Goal Formulation: With patient Time For Goal Achievement: 05/26/19 Potential to Achieve Goals: Good ADL Goals Pt Will Perform Grooming: with modified independence;standing Pt Will Perform Lower Body Bathing: with set-up;sit to/from stand;sitting/lateral leans Pt Will  Perform Lower Body Dressing: with set-up;sit to/from stand;sitting/lateral leans Pt Will Transfer to Toilet: with supervision;ambulating;regular height toilet Pt Will Perform Toileting - Clothing Manipulation and hygiene: with supervision;sit to/from stand;sitting/lateral leans Pt Will Perform Tub/Shower Transfer: with supervision;Stand pivot transfer  OT Frequency: Min 3X/week   Barriers to D/C:            Co-evaluation              AM-PAC OT "6 Clicks" Daily Activity     Outcome Measure Help from another person eating meals?: None Help from another person taking care of personal grooming?: A Little Help from another person toileting, which includes using toliet, bedpan, or urinal?: A Little Help from another person bathing (including washing, rinsing, drying)?: A Little Help from another person to put on and taking off regular upper body clothing?: A Little Help from another person to put on and taking off regular lower body clothing?: A Little 6 Click Score: 19   End of Session Equipment Utilized During Treatment: Gait belt;Rolling walker Nurse Communication: Mobility status;Other (comment)(stomach pain)  Activity Tolerance: Patient limited by pain(limited by reported stomach pain today ) Patient left: in bed;with call bell/phone within reach;with bed alarm set  OT Visit Diagnosis: Unsteadiness on feet (R26.81);Other abnormalities of gait and mobility (R26.89);Muscle weakness (generalized) (M62.81)  Time: 1201-1229 OT Time Calculation (min): 28 min Charges:  OT General Charges $OT Visit: 1 Visit OT Evaluation $OT Eval Moderate Complexity: 1 Mod OT Treatments $Therapeutic Activity: 8-22 mins  Layla Maw, OTR/L  Layla Maw 05/12/2019, 2:16 PM

## 2019-05-12 NOTE — Plan of Care (Signed)
  Problem: Clinical Measurements: Goal: Cardiovascular complication will be avoided Outcome: Progressing   Problem: Clinical Measurements: Goal: Respiratory complications will improve Outcome: Progressing   Problem: Clinical Measurements: Goal: Ability to maintain clinical measurements within normal limits will improve Outcome: Progressing   

## 2019-05-13 DIAGNOSIS — R1084 Generalized abdominal pain: Secondary | ICD-10-CM

## 2019-05-13 DIAGNOSIS — D649 Anemia, unspecified: Secondary | ICD-10-CM

## 2019-05-13 LAB — COMPREHENSIVE METABOLIC PANEL
ALT: 24 U/L (ref 0–44)
AST: 25 U/L (ref 15–41)
Albumin: 3.6 g/dL (ref 3.5–5.0)
Alkaline Phosphatase: 111 U/L (ref 38–126)
Anion gap: 18 — ABNORMAL HIGH (ref 5–15)
BUN: 110 mg/dL — ABNORMAL HIGH (ref 6–20)
CO2: 21 mmol/L — ABNORMAL LOW (ref 22–32)
Calcium: 9.5 mg/dL (ref 8.9–10.3)
Chloride: 103 mmol/L (ref 98–111)
Creatinine, Ser: 6.51 mg/dL — ABNORMAL HIGH (ref 0.44–1.00)
GFR calc Af Amer: 8 mL/min — ABNORMAL LOW (ref >60)
GFR calc non Af Amer: 7 mL/min — ABNORMAL LOW (ref >60)
Glucose, Bld: 119 mg/dL — ABNORMAL HIGH (ref 70–99)
Potassium: 4.2 mmol/L (ref 3.5–5.1)
Sodium: 142 mmol/L (ref 135–145)
Total Bilirubin: 1.2 mg/dL (ref 0.3–1.2)
Total Protein: 6.1 g/dL — ABNORMAL LOW (ref 6.5–8.1)

## 2019-05-13 LAB — CBC
HCT: 29.9 % — ABNORMAL LOW (ref 36.0–46.0)
Hemoglobin: 9.6 g/dL — ABNORMAL LOW (ref 12.0–15.0)
MCH: 32.3 pg (ref 26.0–34.0)
MCHC: 32.1 g/dL (ref 30.0–36.0)
MCV: 100.7 fL — ABNORMAL HIGH (ref 80.0–100.0)
Platelets: 156 10*3/uL (ref 150–400)
RBC: 2.97 MIL/uL — ABNORMAL LOW (ref 3.87–5.11)
RDW: 21.6 % — ABNORMAL HIGH (ref 11.5–15.5)
WBC: 3 10*3/uL — ABNORMAL LOW (ref 4.0–10.5)
nRBC: 2.6 % — ABNORMAL HIGH (ref 0.0–0.2)

## 2019-05-13 LAB — C-REACTIVE PROTEIN: CRP: 1.3 mg/dL — ABNORMAL HIGH (ref <1.0)

## 2019-05-13 LAB — D-DIMER, QUANTITATIVE: D-Dimer, Quant: 3.17 ug/mL-FEU — ABNORMAL HIGH (ref 0.00–0.50)

## 2019-05-13 MED ORDER — METHYLPREDNISOLONE SODIUM SUCC 40 MG IJ SOLR
40.0000 mg | Freq: Every day | INTRAMUSCULAR | Status: DC
  Administered 2019-05-14 – 2019-05-16 (×3): 40 mg via INTRAVENOUS
  Filled 2019-05-13 (×3): qty 1

## 2019-05-13 MED ORDER — SODIUM BICARBONATE 650 MG PO TABS
650.0000 mg | ORAL_TABLET | Freq: Three times a day (TID) | ORAL | Status: DC
  Administered 2019-05-14 – 2019-05-15 (×4): 650 mg via ORAL
  Filled 2019-05-13 (×6): qty 1

## 2019-05-13 MED ORDER — HYDROCORTISONE 1 % EX CREA
TOPICAL_CREAM | Freq: Two times a day (BID) | CUTANEOUS | Status: DC
  Administered 2019-05-13: 1 via TOPICAL
  Filled 2019-05-13: qty 28

## 2019-05-13 MED ORDER — MORPHINE SULFATE (PF) 2 MG/ML IV SOLN: 2.0000 mg | Freq: Once | INTRAVENOUS | Status: DC

## 2019-05-13 NOTE — Plan of Care (Signed)
  Problem: Nutrition: Goal: Adequate nutrition will be maintained Outcome: Not Progressing   

## 2019-05-13 NOTE — Progress Notes (Signed)
PROGRESS NOTE                                                                                                                                                                                                             Patient Demographics:    Tina Watkins, is a 54 y.o. female, DOB - 02/22/1965, YCX:[id number]  Admit date - 05/08/2019   Admitting Physician Domenic Polite, MD  Outpatient Primary MD for the patient is [name], FNP  LOS - 4   Chief Complaint  Patient presents with  . Low Hemoglobin  . Hypotension       Brief Narrative    This is a chronically ill 54 year old female with history of renal transplant x3, CKD 4/5, chronic diastolic CHF, chronic anemia, chronic hypotension, liver cirrhosis on imaging was sent to the ED by her cardiologist due to worsening anemia,worsening kidney function and hypotension. Patient reports feeling poorly for 3 weeks, progressive dyspnea on exertion.  She is followed closely by Dr. Marval Regal with [facility] and [facility] heart care. -While in the ED, she was afebrile, hypotensive with systolic blood pressure in the 70-80 range, hemoglobin was down to 6.7, Hemoccult negative, creatinine worsened at 5.2 from baseline of 3.5-3.7, in addition COVID-19 PCR was positive, chest x-ray notable for developing left lower lobe pneumonia, no evidence of hypoxemia. -No evidence of significant bleed, patient had nausea during first unit transfusion, so it has been continued, and she did receive another unit later, with good response and improvement of hemoglobin .    Subjective:    Tina Watkins today still reports some nausea, no vomiting, reports no change in abdominal pain.    -There is no labs done this morning as patient is difficult stick, and let attempted x3 were unsuccessful, repeat her labs tomorrow.     Assessment  & Plan :    Principal Problem:   Pneumonia due to COVID-19  virus Active Problems:   AKI (acute kidney injury) ([place])   Hypotension   Anemia   CHF (congestive heart failure) ([place])   COVID-19 for pneumonia . -Chest x-ray significant for left lung opacity . -There is no evidence of hypoxia . -Continue to trend inflammatory markers closely .  -Continue with IV steroids(change hydrocortisone to Solu-Medrol). Will  decrease Solu-Medrol to 40 mg IV once daily . -Treated  with IV remdesivir. -Procalcitonin is elevated, patient is immunocompromise, continue with  IV Rocephin and azithromycin to cover for bacterial infection as well . -She was encouraged with incentive spirometry . COVID-19 Labs  Recent Labs    05/12/19 4098  DDIMER 3.05*  CRP 1.9*    Lab Results  Component Value Date   SARSCOV2NAA POSITIVE (A) 05/09/2019   [place] NEGATIVE 03/17/2019     Hypotension: -Acute on chronic, multifactorial secondary to anemia, volume depletion and dehydration. -Started on low-dose midodrine -Received appropriate volume resuscitation with IV fluids and blood transfusions -Steroid-dependent, initially on IV hydrocortisone, currently on IV Solu-Medrol  Anemia of chronic kidney disease -No evidence of significant blood loss, she is Hemoccult negative, this is most likely anemia of chronic kidney disease. -Hemoglobin 6.7 on presentation, responded to 1 unit PRBC transfusion with stable hemoglobin. -procrit per renal.  Abdominal pain/nausea/vomiting. -Nausea and vomiting most likely related to Covid, no further vomiting, nausea has improved, still reports some abdominal pain, CT abdomen pelvis without contrast was obtained, with no acute findings in abdomen.  - Will check lipase level.  AKI on CKD stage IV in transplant Kidney :  - History of ESRD status post renal transplant complicated by CKD stage IV on chronic steroid and immunosuppressive therapy.   - Creatinine was ranging between 3.5-3.7 on recent labs, continue to increase, most recent   is 5.6.. -May need dialysis if creatinine continues to increase .  Management per renal. - continue with immunosuppressive therapy. -Normal protein, kidney ultrasound without hydronephrosis of transplanted kidney.  Metabolic acidosis -Resolved with bicarb drip.  Chronic diastolic congestive heart failure:  - No signs of volume overload at this time.  Hold diuretic given hypotension.  Oral thrush -She is on steroids, increased her nystatin mouthwash.   COVID-19 Labs  Recent Labs    05/12/19 1191  DDIMER 3.05*  CRP 1.9*    Lab Results  Component Value Date   SARSCOV2NAA POSITIVE (A) 05/09/2019   [place] NEGATIVE 03/17/2019     Code Status : Inpatient  Family Communication  : D/W mother via phone 4/10.  Disposition Plan  : home with HH.  Barriers For Discharge : On IV antibiotic,and IV steroids, worsening renal function  Consults  :  renal  Procedures  : none  DVT Prophylaxis  : [place] heparin  Lab Results  Component Value Date   PLT 185 05/12/2019    Antibiotics  :    Anti-infectives (From admission, onward)   Start     Dose/Rate Route Frequency Ordered Stop   05/12/19 1000  azithromycin (ZITHROMAX) tablet 500 mg     500 mg Oral Daily 05/11/19 0929     05/11/19 0600  cefTRIAXone (ROCEPHIN) 1 g in sodium chloride 0.9 % 100 mL IVPB     1 g 200 mL/hr over 30 Minutes Intravenous Every 24 hours 05/10/19 0639     05/10/19 1000  remdesivir 100 mg in sodium chloride 0.9 % 100 mL IVPB     100 mg 200 mL/hr over 30 Minutes Intravenous Daily 05/09/19 0646 05/13/19 0943   05/10/19 0800  azithromycin (ZITHROMAX) 500 mg in sodium chloride 0.9 % 250 mL IVPB  Status:  Discontinued     500 mg 250 mL/hr over 60 Minutes Intravenous Every 24 hours 05/10/19 0639 05/11/19 0929   05/10/19 0645  cefTRIAXone (ROCEPHIN) 1 g in sodium chloride 0.9 % 100 mL IVPB     1 g 200 mL/hr over 30 Minutes Intravenous  Once 05/10/19 0641  05/10/19 0900   05/09/19 0730  remdesivir 100 mg  in sodium chloride 0.9 % 100 mL IVPB     100 mg 200 mL/hr over 30 Minutes Intravenous Every 30 min 05/09/19 0646 05/09/19 1017   05/09/19 0145  azithromycin (ZITHROMAX) 500 mg in sodium chloride 0.9 % 250 mL IVPB     500 mg 250 mL/hr over 60 Minutes Intravenous  Once 05/09/19 0134 05/09/19 0326   05/09/19 0145  cefTRIAXone (ROCEPHIN) 1 g in sodium chloride 0.9 % 100 mL IVPB     1 g 200 mL/hr over 30 Minutes Intravenous  Once 05/09/19 0134 05/09/19 0221        Objective:   Vitals:   05/12/19 2008 05/13/19 0000 05/13/19 0400 05/13/19 0800  BP: 106/64 110/64 (!) 106/55 129/68  Pulse: (!) 53 70 (!) 52 68  Resp: 20 (!) 21 10 (!) 21  Temp: 98 F (36.7 C) 98.1 F (36.7 C) (!) 97.5 F (36.4 C) 98.1 F (36.7 C)  TempSrc: Axillary Oral Oral Oral  SpO2: 94% 92% 92% 94%  Weight:      Height:        Wt Readings from Last 3 Encounters:  05/09/19 52.2 kg  05/08/19 52.3 kg  04/19/19 54.3 kg     Intake/Output Summary (Last 24 hours) at 05/13/2019 1131 Last data filed at 05/12/2019 1800 Gross per 24 hour  Intake 1382.66 ml  Output 3 ml  Net 1379.66 ml     Physical Exam  Awake Alert, Oriented X 3, No new F.N deficits, Normal affect, right eye blindness Symmetrical Chest wall movement, Good air movement bilaterally, CTAB RRR,No Gallops,Rubs or new Murmurs, No Parasternal Heave +ve B.Sounds, Abd Soft, mild diffuse abd  tenderness, No rebound - guarding or rigidity. No Cyanosis, Clubbing or edema, No new Rash or bruise      Data Review:    CBC Recent Labs  Lab 05/08/19 2133 05/09/19 0618 05/10/19 0321 05/11/19 0308 05/12/19 0633  WBC 3.1* 2.0* 3.2* 2.4* 2.8*  HGB 7.6* 6.7* 8.8* 8.9* 9.7*  HCT 23.8* 21.6* 26.8* 27.1* 30.5*  PLT 182 150 157 154 185  MCV 103.9* 105.9* 97.8 97.5 102.0*  MCH 33.2 32.8 32.1 32.0 32.4  MCHC 31.9 31.0 32.8 32.8 31.8  RDW 23.4* 23.0* 22.5* 21.9* 22.1*    Chemistries  Recent Labs  Lab 05/08/19 0854 05/08/19 2133 05/09/19 1130  05/10/19 0321 05/11/19 0308 05/12/19 0633  NA 138 135  --  136 139 141  K 4.4 4.3  --  5.0 3.9 4.2  CL 105 107  --  106 102 101  CO2 15* 15*  --  11* 21* 20*  GLUCOSE 89 108*  --  106* 122* 98  BUN 82* 90*  --  92* 91* 94*  CREATININE 4.59* 5.22*  --  4.99* 5.74* 5.62*  CALCIUM 9.3 8.8*  --  9.1 8.7* 9.0  AST  --   --   --   --   --  29  ALT  --   --   --   --   --  21  ALKPHOS  --   --   --   --   --  107  BILITOT  --   --  0.8  --   --  0.9   ------------------------------------------------------------------------------------------------------------------ No results for input(s): CHOL, HDL, LDLCALC, TRIG, CHOLHDL, LDLDIRECT in the last 72 hours.  Lab Results  Component Value Date   HGBA1C 4.8 05/08/2019   ------------------------------------------------------------------------------------------------------------------ No  results for input(s): TSH, T4TOTAL, T3FREE, THYROIDAB in the last 72 hours.  Invalid input(s): FREET3 ------------------------------------------------------------------------------------------------------------------ No results for input(s): VITAMINB12, FOLATE, FERRITIN, TIBC, IRON, RETICCTPCT in the last 72 hours.  Coagulation profile No results for input(s): INR, PROTIME in the last 168 hours.  Recent Labs    05/12/19 0633  DDIMER 3.05*    Cardiac Enzymes No results for input(s): CKMB, TROPONINI, MYOGLOBIN in the last 168 hours.  Invalid input(s): CK ------------------------------------------------------------------------------------------------------------------    Component Value Date/Time   BNP 481.5 (H) 04/17/2019 1718    Inpatient Medications  Scheduled Meds: . vitamin C  500 mg Oral Daily  . aspirin EC  81 mg Oral Daily  . azaTHIOprine  100 mg Oral Daily  . azithromycin  500 mg Oral Daily  . folic acid  1 mg Oral Daily  . gabapentin  100 mg Oral BID  . heparin  5,000 Units Subcutaneous Q8H  . hydrocortisone cream   Topical BID  .  latanoprost  1 drop Left Eye QHS  . methylPREDNISolone (SOLU-MEDROL) injection  40 mg Intravenous Q12H  . midodrine  5 mg Oral BID WC  .  morphine injection  2 mg Intravenous Once  . nystatin  5 mL Oral QID  . pantoprazole  40 mg Oral Q1200  . zinc sulfate  220 mg Oral Daily   Continuous Infusions: . cefTRIAXone (ROCEPHIN)  IV 1 g (05/13/19 0600)  . sodium chloride Stopped (05/09/19 0929)   PRN Meds:.acetaminophen, benzonatate, lidocaine, ondansetron (ZOFRAN) IV, oxyCODONE-acetaminophen **AND** oxyCODONE, promethazine **OR** promethazine **OR** promethazine  Micro Results Recent Results (from the past 240 hour(s))  Blood culture (routine x 2)     Status: None (Preliminary result)   Collection Time: 05/09/19 12:21 AM   Specimen: BLOOD RIGHT HAND  Result Value Ref Range Status   Specimen Description BLOOD RIGHT HAND  Final   Special Requests   Final    BOTTLES DRAWN AEROBIC AND ANAEROBIC Blood Culture results may not be optimal due to an inadequate volume of blood received in culture bottles   Culture   Final    NO GROWTH 4 DAYS Performed at [facility], [place] [address] [place], [place] [address]    Report Status PENDING  Incomplete  SARS CORONAVIRUS 2 (TAT 6-24 HRS) Nasopharyngeal Nasopharyngeal Swab     Status: Abnormal   Collection Time: 05/09/19 12:25 AM   Specimen: Nasopharyngeal Swab  Result Value Ref Range Status   SARS Coronavirus 2 POSITIVE (A) NEGATIVE Final    Comment: RESULT CALLED TO, READ BACK BY AND VERIFIED WITH: RN OSORIO BAILEY AT 0620 ON 05/09/2019 BY MESSAN HOUEGNIFIO (NOTE) SARS-CoV-2 target nucleic acids are DETECTED. The SARS-CoV-2 RNA is generally detectable in upper and lower respiratory specimens during the acute phase of infection. Positive results are indicative of the presence of SARS-CoV-2 RNA. Clinical correlation with patient history and other diagnostic information is  necessary to determine patient infection status. Positive results  do not rule out bacterial infection or co-infection with other viruses.  The expected result is Negative. Fact Sheet for Patients: SugarRoll.be Fact Sheet for Healthcare Providers: https://www.woods-mathews.com/ This test is not yet approved or cleared by the [place] FDA and  has been authorized for detection and/or diagnosis of SARS-CoV-2 by FDA under an Emergency Use Authorization (EUA). This EUA will remain  in effect (meaning this tes t can be used) for the duration of the COVID-19 declaration under Section 564(b)(1) of the Act, 21 U.S.C. section 360bbb-3(b)(1), unless the  authorization is terminated or revoked sooner. Performed at [facility], [place] [address] [place], [place] [address]   Gram stain     Status: None   Collection Time: 05/09/19  5:26 AM   Specimen: Urine, Random  Result Value Ref Range Status   Specimen Description URINE, RANDOM  Final   Special Requests Immunocompromised  Final   Gram Stain   Final    WBC PRESENT,BOTH PMN AND MONONUCLEAR GRAM POSITIVE COCCI IN PAIRS GRAM VARIABLE ROD CYTOSPIN SMEAR Performed at [facility], [address], [place], [place] [address]    Report Status 05/09/2019 FINAL  Final  Urine culture     Status: Abnormal   Collection Time: 05/09/19  5:26 AM   Specimen: Urine, Random  Result Value Ref Range Status   Specimen Description URINE, RANDOM  Final   Special Requests Immunocompromised  Final   Culture (A)  Final    10,000 COLONIES/mL DIPHTHEROIDS(CORYNEBACTERIUM SPECIES) Standardized susceptibility testing for this organism is not available. Performed at [facility], [place] [address], [place], [place] [address]    Report Status 05/10/2019 FINAL  Final  Blood culture (routine x 2)     Status: None (Preliminary result)   Collection Time: 05/09/19  6:18 AM   Specimen: BLOOD RIGHT HAND  Result Value Ref Range Status   Specimen Description BLOOD RIGHT HAND   Final   Special Requests   Final    BOTTLES DRAWN AEROBIC AND ANAEROBIC Blood Culture results may not be optimal due to an inadequate volume of blood received in culture bottles   Culture   Final    NO GROWTH 4 DAYS Performed at [facility], [place] [address], [place], [place] [address]    Report Status PENDING  Incomplete    Radiology Reports CT ABDOMEN PELVIS WO CONTRAST  Result Date: 05/12/2019 CLINICAL DATA:  Abdominal pain and vomiting, COVID-19 positivity EXAM: CT ABDOMEN AND PELVIS WITHOUT CONTRAST TECHNIQUE: Multidetector CT imaging of the abdomen and pelvis was performed following the standard protocol without IV contrast. COMPARISON:  08/09/2017, 08/25/2018 FINDINGS: Lower chest: Bilateral pleural effusions are noted right greater than left with associated compressive atelectatic changes. Hepatobiliary: No focal liver abnormality is seen. No gallstones, gallbladder wall thickening, or biliary dilatation. Pancreas: Unremarkable. No pancreatic ductal dilatation or surrounding inflammatory changes. Spleen: Splenic granulomas are noted. Adrenals/Urinary Tract: Adrenal glands appear within normal limits. The native kidneys have been surgically removed. A transplant kidney is noted in the right pelvis. No obstructive changes are seen. 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 Stomach/Bowel: No obstructive or inflammatory changes of the bowel are noted. Vascular/Lymphatic: Vascular calcifications are noted. Some stable retroperitoneal lymph nodes are seen. Given their long-term stability there felt to be benign in etiology. Prominence of the azygos and hemi azygous system in the retrocrural space is noted. Reproductive: Uterus and bilateral adnexa are unremarkable. Other: Changes of diffuse anasarca.  Minimal ascites is seen. Musculoskeletal: No acute bony abnormality is noted. 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 IMPRESSION:  Minimal ascites and mild changes of anasarca. Bilateral pleural effusions right greater than left with associated basilar atelectasis. 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 No acute abnormality is noted within the abdomen. Electronically Signed   By: Inez Catalina M.D.   On: 05/12/2019 14:51   DG Chest 2 View  Result Date: 04/17/2019 CLINICAL DATA:  Dyspnea and weakness. EXAM:  CHEST - 2 VIEW COMPARISON:  March 20, 2019. FINDINGS: There are stable mild cardiomegaly and superior vena cava stent graft placement. Increased interstitial opacity projects on the bilateral infrahilar regions with mild bronchial wall thickening, but without obscuration of the diaphragm or heart borders. There is persistent interstitial edema. There is no pneumothorax or obvious pleural effusion. Aortic knob calcified atherosclerosis and postsurgical change of lower cervical ACDF are present. Surgical clips project on the upper abdomen. IMPRESSION: Increased bilateral infrahilar bronchitis with nonspecific small airways disease which may be inflammatory, infectious or aspiration-related. No pneumonia. Stable cardiomegaly and postoperative changes with subtle pulmonary edema but no obvious pleural effusion. Electronically Signed   By: Revonda Humphrey   On: 04/17/2019 15:09   US Renal Transplant w/Doppler  Result Date: 05/10/2019 CLINICAL DATA:  Acute kidney injury EXAM: ULTRASOUND OF RENAL TRANSPLANT WITH RENAL DOPPLER ULTRASOUND TECHNIQUE: Ultrasound examination of the renal transplant was performed with gray-scale, color and duplex doppler evaluation. COMPARISON:  August 12, 2017 FINDINGS: Transplant kidney location: RLQ Transplant Kidney: Renal measurements: 10.8 x 5.6 x 5 cm = volume: There is 175mL. Normal in size and parenchymal echogenicity. No evidence of mass or hydronephrosis. No peri-transplant fluid collection seen. Color flow in the main renal artery:  Yes Color flow in the main renal vein:  Yes  Duplex Doppler Evaluation: Main Renal Artery Resistive Index: 0.82 Venous waveform in main renal vein:  Present Intrarenal resistive index in upper pole:  0.56 (normal 0.6-0.8; equivocal 0.8-0.9; abnormal >= 0.9) Intrarenal resistive index in lower pole: 0.55 (normal 0.6-0.8; equivocal 0.8-0.9; abnormal >= 0.9) Bladder: The bladder is decompressed which limits evaluation. Other findings:  None. IMPRESSION: 1. Renal transplant in the right lower quadrant without evidence for hydronephrosis. 2. Patent main renal artery and vein. 3. Relatively normal resistive indices without significant change from study in 2019. Electronically Signed   By: Constance Holster M.D.   On: 05/10/2019 18:51   US RENAL  Result Date: 05/09/2019 CLINICAL DATA:  AKI, renal transplant EXAM: RENAL / URINARY TRACT ULTRASOUND COMPLETE COMPARISON:  None. FINDINGS: Kidneys: Native kidneys are absent surgically. Right lower quadrant transplant kidney measures 10.7 x 4.9 x 4.8 cm = volume 131.5 mL. No hydronephrosis. Bladder: Appears normal for degree of bladder distention. Other: None. IMPRESSION: Bilateral nephrectomy with right lower quadrant transplant. No hydronephrosis. Electronically Signed   By: Macy Mis M.D.   On: 05/09/2019 15:10   DG Chest Portable 1 View  Result Date: 05/09/2019 CLINICAL DATA:  Hypotension. EXAM: PORTABLE CHEST 1 VIEW COMPARISON:  05/18/2019 FINDINGS: Cardiac silhouette is stable. Again noted is an SVC stent. There is vascular congestion without overt pulmonary edema. There is a new dense airspace opacity at the left lung base concerning for pneumonia or aspiration. There is no pneumothorax. No significant pleural effusion. No acute osseous abnormality. IMPRESSION: Findings concerning for developing left lower lobe pneumonia in the appropriate clinical setting. Electronically Signed   By: Constance Holster M.D.   On: 05/09/2019 00:13   DG Abd Portable 1V  Result Date: 05/10/2019 CLINICAL DATA:  Abdominal  pain. EXAM: PORTABLE ABDOMEN - 1 VIEW COMPARISON:  Plain films of the abdomen 06/17/2015. FINDINGS: The bowel gas pattern is normal. No radio-opaque calculi or other significant radiographic abnormality are seen. Multiple surgical clips in the pelvis and upper abdomen are unchanged. IMPRESSION: No acute finding. Electronically Signed   By: Inge Rise M.D.   On: 05/10/2019 13:55      Phillips Climes M.D on 05/13/2019 at 11:31  AM  Between 7am to 7pm - Pager - [phone]  After 7pm go to [email or web address] - password [facility]  [facility] -  Office  [phone]

## 2019-05-13 NOTE — Progress Notes (Signed)
[facility] NEPHROLOGY PROGRESS NOTE  Assessment/ Plan: Pt is a 54 y.o. yo female with history of ESRD status post renal transplant x3, most recent in 08/2003, hypertension, TIA, heart failure, admitted with Covid pneumonia, seen for AKI on CKD. Home transplant medications: Prograf 3/2, azathioprine 100 daily, prednisone 5 daily.  #COVID-19 pneumonia: Currently on IV steroid, remdesivir.  Also receiving empiric IV Rocephin and azithromycin to cover bacterial infection because of immunocompromised status.  Per primary team.  #Acute kidney injury on CKD stage IV in transplant kidney likely due to Covid infection, hypotension: Urine contaminated, minimal protein.  Kidney ultrasound without hydronephrosis of transplanted kidney.  Creatinine level was plateau yesterday.  Unable to obtain lab today.  She has no features of uremia and volume status looks acceptable.  I will order bladder scan.  Not on IV fluid.  Continue to watch for renal recovery.   I have discussed with the patient that she may need dialysis if no improvement in her renal function.  Currently not uremic and volume status acceptable. Also discussed with her primary nephrologist Dr. Marval Regal.  #History of ESRD status post kidney transplant x3: The Prograf level came back 28.3, unknown if it was trough but the level was very high.  I am holding Prograf further.  Repeating Prograf trough tomorrow morning.  Continue azathioprine and currently on IV steroid.  Discussed with the patient..  # Anemia of CKD and acute illness: Iron saturation acceptable.  Hemoccult negative.  Received Aranesp on 4/7.   Received a unit of blood transfusion.  #Hypotension: On midodrine.  Monitor blood pressure.  #Metabolic acidosis: Start oral sodium bicarbonate.  Monitor.  #Nausea vomiting and abdominal pain: She does have diffuse abdomen tenderness.  Her lactic acid level is not elevated.  Abdominal x-ray unremarkable.  CT scan  unremarkable.   Continue supportive care.  Discussed with the primary team.  Subjective: Seen and examined bedside.  Reports chronic nausea and some abdominal discomfort.  No vomiting, chest pain, shortness of breath, headache or dizziness.  Unable to obtain lab test today.  Objective Vital signs in last 24 hours: Vitals:   05/12/19 2008 05/13/19 0000 05/13/19 0400 05/13/19 0800  BP: 106/64 110/64 (!) 106/55 129/68  Pulse: (!) 53 70 (!) 52 68  Resp: 20 (!) 21 10 (!) 21  Temp: 98 F (36.7 C) 98.1 F (36.7 C) (!) 97.5 F (36.4 C) 98.1 F (36.7 C)  TempSrc: Axillary Oral Oral Oral  SpO2: 94% 92% 92% 94%  Weight:      Height:       Weight change:   Intake/Output Summary (Last 24 hours) at 05/13/2019 1219 Last data filed at 05/12/2019 1800 Gross per 24 hour  Intake 1382.66 ml  Output 3 ml  Net 1379.66 ml       Labs: Basic Metabolic Panel: Recent Labs  Lab 05/10/19 0321 05/11/19 0308 05/12/19 0633  NA 136 139 141  K 5.0 3.9 4.2  CL 106 102 101  CO2 11* 21* 20*  GLUCOSE 106* 122* 98  BUN 92* 91* 94*  CREATININE 4.99* 5.74* 5.62*  CALCIUM 9.1 8.7* 9.0   Liver Function Tests: Recent Labs  Lab 05/09/19 1130 05/12/19 0633  AST  --  29  ALT  --  21  ALKPHOS  --  107  BILITOT 0.8 0.9  PROT  --  5.6*  ALBUMIN  --  3.3*   No results for input(s): LIPASE, AMYLASE in the last 168 hours. No results for input(s):  AMMONIA in the last 168 hours. CBC: Recent Labs  Lab 05/08/19 2133 05/08/19 2133 05/09/19 0618 05/09/19 0618 05/10/19 0321 05/11/19 0308 05/12/19 0633  WBC 3.1*   < > 2.0*   < > 3.2* 2.4* 2.8*  HGB 7.6*   < > 6.7*   < > 8.8* 8.9* 9.7*  HCT 23.8*   < > 21.6*   < > 26.8* 27.1* 30.5*  MCV 103.9*  --  105.9*  --  97.8 97.5 102.0*  PLT 182   < > 150   < > 157 154 185   < > = values in this interval not displayed.   Cardiac Enzymes: No results for input(s): CKTOTAL, CKMB, CKMBINDEX, TROPONINI in the last 168 hours. CBG: Recent Labs  Lab  05/11/19 0845 05/11/19 1206  GLUCAP 117* 113*    Iron Studies:  No results for input(s): IRON, TIBC, TRANSFERRIN, FERRITIN in the last 72 hours. Studies/Results: CT ABDOMEN PELVIS WO CONTRAST  Result Date: 05/12/2019 CLINICAL DATA:  Abdominal pain and vomiting, COVID-19 positivity EXAM: CT ABDOMEN AND PELVIS WITHOUT CONTRAST TECHNIQUE: Multidetector CT imaging of the abdomen and pelvis was performed following the standard protocol without IV contrast. COMPARISON:  08/09/2017, 08/25/2018 FINDINGS: Lower chest: Bilateral pleural effusions are noted right greater than left with associated compressive atelectatic changes. Hepatobiliary: No focal liver abnormality is seen. No gallstones, gallbladder wall thickening, or biliary dilatation. Pancreas: Unremarkable. No pancreatic ductal dilatation or surrounding inflammatory changes. Spleen: Splenic granulomas are noted. Adrenals/Urinary Tract: Adrenal glands appear within normal limits. The native kidneys have been surgically removed. A transplant kidney is noted in the right pelvis. No obstructive changes are seen. 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 Stomach/Bowel: No obstructive or inflammatory changes of the bowel are noted. Vascular/Lymphatic: Vascular calcifications are noted. Some stable retroperitoneal lymph nodes are seen. Given their long-term stability there felt to be benign in etiology. Prominence of the azygos and hemi azygous system in the retrocrural space is noted. Reproductive: Uterus and bilateral adnexa are unremarkable. Other: Changes of diffuse anasarca.  Minimal ascites is seen. Musculoskeletal: No acute bony abnormality is noted. 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 IMPRESSION: Minimal ascites and mild changes of anasarca. Bilateral pleural effusions right greater than left with associated basilar atelectasis. Renal transplant kidney and findings of bilateral total  nephrectomy. No acute abnormality is noted within the abdomen. Electronically Signed   By: Inez Catalina M.D.   On: 05/12/2019 14:51    Medications: Infusions: . cefTRIAXone (ROCEPHIN)  IV 1 g (05/13/19 0600)  . sodium chloride Stopped (05/09/19 0929)    Scheduled Medications: . vitamin C  500 mg Oral Daily  . aspirin EC  81 mg Oral Daily  . azaTHIOprine  100 mg Oral Daily  . azithromycin  500 mg Oral Daily  . folic acid  1 mg Oral Daily  . gabapentin  100 mg Oral BID  . heparin  5,000 Units Subcutaneous Q8H  . hydrocortisone cream   Topical BID  . latanoprost  1 drop Left Eye QHS  . [START ON 05/14/2019] methylPREDNISolone (SOLU-MEDROL) injection  40 mg Intravenous Daily  . midodrine  5 mg Oral BID WC  .  morphine injection  2 mg Intravenous Once  . nystatin  5 mL Oral QID  . pantoprazole  40 mg Oral Q1200  . zinc sulfate  220 mg Oral Daily    have reviewed scheduled and prn medications.  Physical Exam:  General:NAD, comfortable, able to lie flat. Heart:RRR, s1s2 nl Lungs:clear b/l, no crackle Abdomen:soft, mild diffuse tenderness, non-distended Extremities:No LE edema Neurology: Alert awake and following commands, no asterixis  Somya Jauregui Tanna Furry 05/13/2019,12:19 PM  LOS: 4 days  Pager: [phone]

## 2019-05-14 LAB — CBC
HCT: 30 % — ABNORMAL LOW (ref 36.0–46.0)
Hemoglobin: 9.6 g/dL — ABNORMAL LOW (ref 12.0–15.0)
MCH: 32.2 pg (ref 26.0–34.0)
MCHC: 32 g/dL (ref 30.0–36.0)
MCV: 100.7 fL — ABNORMAL HIGH (ref 80.0–100.0)
Platelets: 163 10*3/uL (ref 150–400)
RBC: 2.98 MIL/uL — ABNORMAL LOW (ref 3.87–5.11)
RDW: 21.7 % — ABNORMAL HIGH (ref 11.5–15.5)
WBC: 3 10*3/uL — ABNORMAL LOW (ref 4.0–10.5)
nRBC: 3 % — ABNORMAL HIGH (ref 0.0–0.2)

## 2019-05-14 LAB — CULTURE, BLOOD (ROUTINE X 2)
Culture: NO GROWTH
Culture: NO GROWTH

## 2019-05-14 LAB — COMPREHENSIVE METABOLIC PANEL
ALT: 18 U/L (ref 0–44)
AST: 22 U/L (ref 15–41)
Albumin: 3.5 g/dL (ref 3.5–5.0)
Alkaline Phosphatase: 106 U/L (ref 38–126)
Anion gap: 20 — ABNORMAL HIGH (ref 5–15)
BUN: 108 mg/dL — ABNORMAL HIGH (ref 6–20)
CO2: 20 mmol/L — ABNORMAL LOW (ref 22–32)
Calcium: 9.5 mg/dL (ref 8.9–10.3)
Chloride: 103 mmol/L (ref 98–111)
Creatinine, Ser: 6.25 mg/dL — ABNORMAL HIGH (ref 0.44–1.00)
GFR calc Af Amer: 8 mL/min — ABNORMAL LOW (ref >60)
GFR calc non Af Amer: 7 mL/min — ABNORMAL LOW (ref >60)
Glucose, Bld: 89 mg/dL (ref 70–99)
Potassium: 3.8 mmol/L (ref 3.5–5.1)
Sodium: 143 mmol/L (ref 135–145)
Total Bilirubin: 0.9 mg/dL (ref 0.3–1.2)
Total Protein: 6 g/dL — ABNORMAL LOW (ref 6.5–8.1)

## 2019-05-14 LAB — C-REACTIVE PROTEIN: CRP: 1.4 mg/dL — ABNORMAL HIGH (ref <1.0)

## 2019-05-14 LAB — D-DIMER, QUANTITATIVE: D-Dimer, Quant: 3.85 ug/mL-FEU — ABNORMAL HIGH (ref 0.00–0.50)

## 2019-05-14 LAB — LIPASE, BLOOD: Lipase: 50 U/L (ref 11–51)

## 2019-05-14 NOTE — Progress Notes (Signed)
PROGRESS NOTE                                                                                                                                                                                                             Patient Demographics:    Tina Watkins, is a 54 y.o. female, DOB - 1966/01/11, ZDG:[id number]  Admit date - 05/08/2019   Admitting Physician Domenic Polite, MD  Outpatient Primary MD for the patient is [name], FNP  LOS - 5   Chief Complaint  Patient presents with  . Low Hemoglobin  . Hypotension       Brief Narrative    This is a chronically ill 54 year old female with history of renal transplant x3, CKD 4/5, chronic diastolic CHF, chronic anemia, chronic hypotension, liver cirrhosis on imaging was sent to the ED by her cardiologist due to worsening anemia,worsening kidney function and hypotension. Patient reports feeling poorly for 3 weeks, progressive dyspnea on exertion.  She is followed closely by Dr. Marval Regal with [facility]. -While in the ED, she was afebrile, hypotensive with systolic blood pressure in the 70-80 range, hemoglobin was down to 6.7, Hemoccult negative, creatinine worsened at 5.2 from baseline of 3.5-3.7, in addition COVID-19 PCR was positive, chest x-ray notable for developing left lower lobe pneumonia, no evidence of hypoxemia. -No evidence of significant bleed, patient had nausea during first unit transfusion, so it has been continued, and she did receive another unit later, with good response and improvement of hemoglobin .    Subjective:    Tina Watkins today able to tolerate some food at lunch, still reports nausea, but no vomiting .   Assessment  & Plan :    Principal Problem:   Pneumonia due to COVID-19 virus Active Problems:   AKI (acute kidney injury) ([place])   Hypotension   Anemia   CHF (congestive heart failure) ([place])   COVID-19 for pneumonia  . -Chest x-ray significant for left lung opacity . -There is no evidence of hypoxia. -Continue to trend inflammatory markers closely .  -Continue with IV steroids, currently on Solu-Medrol 40 mg IV once daily . -Treated with IV remdesivir. -Procalcitonin is elevated, patient is immunocompromise, continue with  IV Rocephin and azithromycin to cover for bacterial infection as well .  To finish total of 5 days -  She was encouraged with incentive spirometry . COVID-19 Labs  Recent Labs    05/12/19 0633 05/13/19 1833 05/14/19 0258  DDIMER 3.05* 3.17* 3.85*  CRP 1.9* 1.3* 1.4*    Lab Results  Component Value Date   SARSCOV2NAA POSITIVE (A) 05/09/2019   [place] NEGATIVE 03/17/2019     Hypotension: -Acute on chronic, multifactorial secondary to anemia, volume depletion and dehydration. -Started on low-dose midodrine -Received appropriate volume resuscitation with IV fluids and blood transfusions -Steroid-dependent, initially on IV hydrocortisone, currently on IV Solu-Medrol -Blood pressure improved  Anemia of chronic kidney disease -No evidence of significant blood loss, she is Hemoccult negative, this is most likely anemia of chronic kidney disease. -Hemoglobin 6.7 on presentation, responded to 1 unit PRBC transfusion with stable hemoglobin. -procrit per renal.  Abdominal pain/nausea/vomiting. -Nausea and vomiting most likely related to Covid, no further vomiting, nausea has improved, still reports some abdominal pain, CT abdomen pelvis without contrast was obtained, with no acute findings in abdomen.  -Lipase within normal limit. -Likely of uremia contributing to the symptoms.  AKI on CKD stage IV in transplant Kidney :  - History of ESRD status post renal transplant complicated by CKD stage IV on chronic steroid and immunosuppressive therapy.   - Creatinine was ranging between 3.5-3.7 on recent labs, increased during hospital stay, today at 6.25, she remains with low urine  output as well . -Management per renal, they might consider hemodialysis, especially if there is concern about uremic symptoms . - continue with immunosuppressive therapy. -Normal protein, kidney ultrasound without hydronephrosis of transplanted kidney.  Metabolic acidosis -Resolved with bicarb drip.  Chronic diastolic congestive heart failure:  - No signs of volume overload at this time.  Hold diuretic given hypotension.  Oral thrush -She is on steroids, increased her nystatin mouthwash.   COVID-19 Labs  Recent Labs    05/12/19 0633 05/13/19 1833 05/14/19 0258  DDIMER 3.05* 3.17* 3.85*  CRP 1.9* 1.3* 1.4*    Lab Results  Component Value Date   SARSCOV2NAA POSITIVE (A) 05/09/2019   [place] NEGATIVE 03/17/2019     Code Status : Inpatient  Family Communication  : D/W mother via phone 4/10.tried to call again on 4/11, unable to leave voice mail.  Disposition Plan  : home with HH.  Barriers For Discharge : On IV antibiotic,and IV steroids, worsening renal function, may need HD continues to worsen.  Consults  :  renal  Procedures  : none  DVT Prophylaxis  : [place] heparin  Lab Results  Component Value Date   PLT 163 05/14/2019    Antibiotics  :    Anti-infectives (From admission, onward)   Start     Dose/Rate Route Frequency Ordered Stop   05/12/19 1000  azithromycin (ZITHROMAX) tablet 500 mg     500 mg Oral Daily 05/11/19 0929 05/19/19 0959   05/11/19 0600  cefTRIAXone (ROCEPHIN) 1 g in sodium chloride 0.9 % 100 mL IVPB     1 g 200 mL/hr over 30 Minutes Intravenous Every 24 hours 05/10/19 0639 05/18/19 0559   05/10/19 1000  remdesivir 100 mg in sodium chloride 0.9 % 100 mL IVPB     100 mg 200 mL/hr over 30 Minutes Intravenous Daily 05/09/19 0646 05/13/19 0943   05/10/19 0800  azithromycin (ZITHROMAX) 500 mg in sodium chloride 0.9 % 250 mL IVPB  Status:  Discontinued     500 mg 250 mL/hr over 60 Minutes Intravenous Every 24 hours 05/10/19 0639 05/11/19  0929   05/10/19 0645  cefTRIAXone (ROCEPHIN) 1 g in sodium chloride 0.9 % 100 mL IVPB     1 g 200 mL/hr over 30 Minutes Intravenous  Once 05/10/19 0641 05/10/19 0900   05/09/19 0730  remdesivir 100 mg in sodium chloride 0.9 % 100 mL IVPB     100 mg 200 mL/hr over 30 Minutes Intravenous Every 30 min 05/09/19 0646 05/09/19 1017   05/09/19 0145  azithromycin (ZITHROMAX) 500 mg in sodium chloride 0.9 % 250 mL IVPB     500 mg 250 mL/hr over 60 Minutes Intravenous  Once 05/09/19 0134 05/09/19 0326   05/09/19 0145  cefTRIAXone (ROCEPHIN) 1 g in sodium chloride 0.9 % 100 mL IVPB     1 g 200 mL/hr over 30 Minutes Intravenous  Once 05/09/19 0134 05/09/19 0221        Objective:   Vitals:   05/14/19 0000 05/14/19 0400 05/14/19 0800 05/14/19 1135  BP: (!) 142/80 129/74 118/66 128/77  Pulse: 82 80 81 76  Resp: (!) 23 20 (!) 21 (!) 22  Temp: 98.9 F (37.2 C) 98.3 F (36.8 C) 98.3 F (36.8 C) 98.9 F (37.2 C)  TempSrc: Oral Oral Oral Oral  SpO2: 94% 91% 90% 90%  Weight:      Height:        Wt Readings from Last 3 Encounters:  05/09/19 52.2 kg  05/08/19 52.3 kg  04/19/19 54.3 kg     Intake/Output Summary (Last 24 hours) at 05/14/2019 1405 Last data filed at 05/14/2019 6195 Gross per 24 hour  Intake --  Output 225 ml  Net -225 ml     Physical Exam  She  was lethargic earlier in the day , but this has improved, she is currently awake alert oriented x3 . Symmetrical Chest wall movement, Good air movement bilaterally, CTAB RRR,No Gallops,Rubs or new Murmurs, No Parasternal Heave +ve B.Sounds, Abd Soft, mild diffuse tenderness, No rebound - guarding or rigidity. No Cyanosis, Clubbing or edema, No new Rash or bruise       Data Review:    CBC Recent Labs  Lab 05/10/19 0321 05/11/19 0308 05/12/19 0633 05/13/19 1833 05/14/19 0258  WBC 3.2* 2.4* 2.8* 3.0* 3.0*  HGB 8.8* 8.9* 9.7* 9.6* 9.6*  HCT 26.8* 27.1* 30.5* 29.9* 30.0*  PLT 157 154 185 156 163  MCV 97.8 97.5 102.0*  100.7* 100.7*  MCH 32.1 32.0 32.4 32.3 32.2  MCHC 32.8 32.8 31.8 32.1 32.0  RDW 22.5* 21.9* 22.1* 21.6* 21.7*    Chemistries  Recent Labs  Lab 05/08/19 2133 05/09/19 1130 05/10/19 0321 05/11/19 0308 05/12/19 0633 05/13/19 2039 05/14/19 0258  NA   < >  --  136 139 141 142 143  K   < >  --  5.0 3.9 4.2 4.2 3.8  CL   < >  --  106 102 101 103 103  CO2   < >  --  11* 21* 20* 21* 20*  GLUCOSE   < >  --  106* 122* 98 119* 89  BUN   < >  --  92* 91* 94* 110* 108*  CREATININE   < >  --  4.99* 5.74* 5.62* 6.51* 6.25*  CALCIUM   < >  --  9.1 8.7* 9.0 9.5 9.5  AST  --   --   --   --  29 25 22   ALT  --   --   --   --  21 24 18   ALKPHOS  --   --   --   --  107 111 106  BILITOT  --  0.8  --   --  0.9 1.2 0.9   < > = values in this interval not displayed.   ------------------------------------------------------------------------------------------------------------------ No results for input(s): CHOL, HDL, LDLCALC, TRIG, CHOLHDL, LDLDIRECT in the last 72 hours.  Lab Results  Component Value Date   HGBA1C 4.8 05/08/2019   ------------------------------------------------------------------------------------------------------------------ No results for input(s): TSH, T4TOTAL, T3FREE, THYROIDAB in the last 72 hours.  Invalid input(s): FREET3 ------------------------------------------------------------------------------------------------------------------ No results for input(s): VITAMINB12, FOLATE, FERRITIN, TIBC, IRON, RETICCTPCT in the last 72 hours.  Coagulation profile No results for input(s): INR, PROTIME in the last 168 hours.  Recent Labs    05/13/19 1833 05/14/19 0258  DDIMER 3.17* 3.85*    Cardiac Enzymes No results for input(s): CKMB, TROPONINI, MYOGLOBIN in the last 168 hours.  Invalid input(s): CK ------------------------------------------------------------------------------------------------------------------    Component Value Date/Time   BNP 481.5 (H) 04/17/2019  1718    Inpatient Medications  Scheduled Meds: . vitamin C  500 mg Oral Daily  . aspirin EC  81 mg Oral Daily  . azaTHIOprine  100 mg Oral Daily  . azithromycin  500 mg Oral Daily  . folic acid  1 mg Oral Daily  . gabapentin  100 mg Oral BID  . heparin  5,000 Units Subcutaneous Q8H  . hydrocortisone cream   Topical BID  . latanoprost  1 drop Left Eye QHS  . methylPREDNISolone (SOLU-MEDROL) injection  40 mg Intravenous Daily  . midodrine  5 mg Oral BID WC  .  morphine injection  2 mg Intravenous Once  . nystatin  5 mL Oral QID  . pantoprazole  40 mg Oral Q1200  . sodium bicarbonate  650 mg Oral TID  . zinc sulfate  220 mg Oral Daily   Continuous Infusions: . cefTRIAXone (ROCEPHIN)  IV 1 g (05/14/19 0512)  . sodium chloride Stopped (05/09/19 0929)   PRN Meds:.acetaminophen, benzonatate, lidocaine, ondansetron (ZOFRAN) IV, oxyCODONE-acetaminophen **AND** oxyCODONE, promethazine **OR** promethazine **OR** promethazine  Micro Results Recent Results (from the past 240 hour(s))  Blood culture (routine x 2)     Status: None   Collection Time: 05/09/19 12:21 AM   Specimen: BLOOD RIGHT HAND  Result Value Ref Range Status   Specimen Description BLOOD RIGHT HAND  Final   Special Requests   Final    BOTTLES DRAWN AEROBIC AND ANAEROBIC Blood Culture results may not be optimal due to an inadequate volume of blood received in culture bottles   Culture   Final    NO GROWTH 5 DAYS Performed at [facility], [place] [address], [place], [place] [address]    Report Status 05/14/2019 FINAL  Final  SARS CORONAVIRUS 2 (TAT 6-24 HRS) Nasopharyngeal Nasopharyngeal Swab     Status: Abnormal   Collection Time: 05/09/19 12:25 AM   Specimen: Nasopharyngeal Swab  Result Value Ref Range Status   SARS Coronavirus 2 POSITIVE (A) NEGATIVE Final    Comment: RESULT CALLED TO, READ BACK BY AND VERIFIED WITH: RN OSORIO BAILEY AT 0620 ON 05/09/2019 BY MESSAN HOUEGNIFIO (NOTE) SARS-CoV-2 target nucleic  acids are DETECTED. The SARS-CoV-2 RNA is generally detectable in upper and lower respiratory specimens during the acute phase of infection. Positive results are indicative of the presence of SARS-CoV-2 RNA. Clinical correlation with patient history and other diagnostic information is  necessary to determine patient infection status. Positive results do not rule out bacterial infection or co-infection with other viruses.  The expected result is Negative. Fact Sheet  for Patients: SugarRoll.be Fact Sheet for Healthcare Providers: https://www.woods-mathews.com/ This test is not yet approved or cleared by the [place] FDA and  has been authorized for detection and/or diagnosis of SARS-CoV-2 by FDA under an Emergency Use Authorization (EUA). This EUA will remain  in effect (meaning this tes t can be used) for the duration of the COVID-19 declaration under Section 564(b)(1) of the Act, 21 U.S.C. section 360bbb-3(b)(1), unless the authorization is terminated or revoked sooner. Performed at [facility], [place] [address], [place], [place] [address]   Gram stain     Status: None   Collection Time: 05/09/19  5:26 AM   Specimen: Urine, Random  Result Value Ref Range Status   Specimen Description URINE, RANDOM  Final   Special Requests Immunocompromised  Final   Gram Stain   Final    WBC PRESENT,BOTH PMN AND MONONUCLEAR GRAM POSITIVE COCCI IN PAIRS GRAM VARIABLE ROD CYTOSPIN SMEAR Performed at [facility], [address] [place], [place] [address]    Report Status 05/09/2019 FINAL  Final  Urine culture     Status: Abnormal   Collection Time: 05/09/19  5:26 AM   Specimen: Urine, Random  Result Value Ref Range Status   Specimen Description URINE, RANDOM  Final   Special Requests Immunocompromised  Final   Culture (A)  Final    10,000 COLONIES/mL DIPHTHEROIDS(CORYNEBACTERIUM SPECIES) Standardized susceptibility testing for this  organism is not available. Performed at [facility], [place] [address] [place], [place] [address]    Report Status 05/10/2019 FINAL  Final  Blood culture (routine x 2)     Status: None   Collection Time: 05/09/19  6:18 AM   Specimen: BLOOD RIGHT HAND  Result Value Ref Range Status   Specimen Description BLOOD RIGHT HAND  Final   Special Requests   Final    BOTTLES DRAWN AEROBIC AND ANAEROBIC Blood Culture results may not be optimal due to an inadequate volume of blood received in culture bottles   Culture   Final    NO GROWTH 5 DAYS Performed at [facility], [place] [address] [place], [place] [address]    Report Status 05/14/2019 FINAL  Final    Radiology Reports CT ABDOMEN PELVIS WO CONTRAST  Result Date: 05/12/2019 CLINICAL DATA:  Abdominal pain and vomiting, COVID-19 positivity EXAM: CT ABDOMEN AND PELVIS WITHOUT CONTRAST TECHNIQUE: Multidetector CT imaging of the abdomen and pelvis was performed following the standard protocol without IV contrast. COMPARISON:  08/09/2017, 08/25/2018 FINDINGS: Lower chest: Bilateral pleural effusions are noted right greater than left with associated compressive atelectatic changes. Hepatobiliary: No focal liver abnormality is seen. No gallstones, gallbladder wall thickening, or biliary dilatation. Pancreas: Unremarkable. No pancreatic ductal dilatation or surrounding inflammatory changes. Spleen: Splenic granulomas are noted. Adrenals/Urinary Tract: Adrenal glands appear within normal limits. The native kidneys have been surgically removed. A transplant kidney is noted in the right pelvis. No obstructive changes are seen. 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 Stomach/Bowel: No obstructive or inflammatory changes of the bowel are noted. Vascular/Lymphatic: Vascular calcifications are noted. Some stable retroperitoneal lymph nodes are seen. Given their long-term stability there felt to be benign in etiology.  Prominence of the azygos and hemi azygous system in the retrocrural space is noted. Reproductive: Uterus and bilateral adnexa are unremarkable. Other: Changes of diffuse anasarca.  Minimal ascites is seen. Musculoskeletal: No acute bony abnormality is noted. 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 IMPRESSION: Minimal ascites and mild changes of anasarca. Bilateral pleural effusions right greater than left with associated basilar atelectasis. 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 No acute abnormality is noted within the abdomen. Electronically Signed   By: Inez Catalina M.D.   On: 05/12/2019 14:51   DG Chest 2 View  Result Date: 04/17/2019 CLINICAL DATA:  Dyspnea and weakness. EXAM: CHEST - 2 VIEW COMPARISON:  March 20, 2019. FINDINGS: There are stable mild cardiomegaly and superior vena cava stent graft placement. Increased interstitial opacity projects on the bilateral infrahilar regions with mild bronchial wall thickening, but without obscuration of the diaphragm or heart borders. There is persistent interstitial edema. There is no pneumothorax or obvious pleural effusion. Aortic knob calcified atherosclerosis and postsurgical change of lower cervical ACDF are present. Surgical clips project on the upper abdomen. IMPRESSION: Increased bilateral infrahilar bronchitis with nonspecific small airways disease which may be inflammatory, infectious or aspiration-related. No pneumonia. Stable cardiomegaly and postoperative changes with subtle pulmonary edema but no obvious pleural effusion. Electronically Signed   By: Revonda Humphrey   On: 04/17/2019 15:09   US Renal Transplant w/Doppler  Result Date: 05/10/2019 CLINICAL DATA:  Acute kidney injury EXAM: ULTRASOUND OF RENAL TRANSPLANT WITH RENAL DOPPLER ULTRASOUND TECHNIQUE: Ultrasound examination of the renal transplant was performed with gray-scale, color and duplex doppler evaluation. COMPARISON:   August 12, 2017 FINDINGS: Transplant kidney location: RLQ Transplant Kidney: Renal measurements: 10.8 x 5.6 x 5 cm = volume: There is 166mL. Normal in size and parenchymal echogenicity. No evidence of mass or hydronephrosis. No peri-transplant fluid collection seen. Color flow in the main renal artery:  Yes Color flow in the main renal vein:  Yes Duplex Doppler Evaluation: Main Renal Artery Resistive Index: 0.82 Venous waveform in main renal vein:  Present Intrarenal resistive index in upper pole:  0.56 (normal 0.6-0.8; equivocal 0.8-0.9; abnormal >= 0.9) Intrarenal resistive index in lower pole: 0.55 (normal 0.6-0.8; equivocal 0.8-0.9; abnormal >= 0.9) Bladder: The bladder is decompressed which limits evaluation. Other findings:  None. IMPRESSION: 1. Renal transplant in the right lower quadrant without evidence for hydronephrosis. 2. Patent main renal artery and vein. 3. Relatively normal resistive indices without significant change from study in 2019. Electronically Signed   By: Constance Holster M.D.   On: 05/10/2019 18:51   US RENAL  Result Date: 05/09/2019 CLINICAL DATA:  AKI, renal transplant EXAM: RENAL / URINARY TRACT ULTRASOUND COMPLETE COMPARISON:  None. FINDINGS: Kidneys: Native kidneys are absent surgically. Right lower quadrant transplant kidney measures 10.7 x 4.9 x 4.8 cm = volume 131.5 mL. No hydronephrosis. Bladder: Appears normal for degree of bladder distention. Other: None. IMPRESSION: Bilateral nephrectomy with right lower quadrant transplant. No hydronephrosis. Electronically Signed   By: Macy Mis M.D.   On: 05/09/2019 15:10   DG Chest Portable 1 View  Result Date: 05/09/2019 CLINICAL DATA:  Hypotension. EXAM: PORTABLE CHEST 1 VIEW COMPARISON:  05/18/2019 FINDINGS: Cardiac silhouette is stable. Again noted is an SVC stent. There is vascular congestion without overt pulmonary edema. There is a new dense airspace opacity at the left lung base concerning for pneumonia or aspiration.  There is no pneumothorax. No significant pleural effusion. No acute osseous abnormality. IMPRESSION: Findings concerning for developing left lower lobe pneumonia in the appropriate clinical setting. Electronically Signed   By: Constance Holster M.D.   On: 05/09/2019 00:13   DG Abd Portable 1V  Result Date: 05/10/2019 CLINICAL DATA:  Abdominal  pain. EXAM: PORTABLE ABDOMEN - 1 VIEW COMPARISON:  Plain films of the abdomen 06/17/2015. FINDINGS: The bowel gas pattern is normal. No radio-opaque calculi or other significant radiographic abnormality are seen. Multiple surgical clips in the pelvis and upper abdomen are unchanged. IMPRESSION: No acute finding. Electronically Signed   By: Inge Rise M.D.   On: 05/10/2019 13:55      Phillips Climes M.D on 05/14/2019 at 2:05 PM  Between 7am to 7pm - Pager - [phone]  After 7pm go to [email or web address] - password [facility]  [facility] -  Office  [phone]

## 2019-05-14 NOTE — Progress Notes (Signed)
[facility] NEPHROLOGY PROGRESS NOTE  Assessment/ Plan: Pt is a 54 y.o. yo female with history of ESRD status post renal transplant x3, most recent in 08/2003, hypertension, TIA, heart failure, admitted with Covid pneumonia, seen for AKI on CKD. Home transplant medications: Prograf 3/2, azathioprine 100 daily, prednisone 5 daily.  #COVID-19 pneumonia: Currently on IV steroid, remdesivir.  Also receiving empiric IV Rocephin and azithromycin to cover bacterial infection because of immunocompromised status.  Per primary team.  #Acute kidney injury on CKD stage IV in transplant kidney likely due to Covid infection, hypotension: Urine contaminated, minimal protein.  Kidney ultrasound without hydronephrosis of transplanted kidney.  Urine output is marginal.  Creatinine level plateau.  Continue bladder scan. Patient understands that she may be heading towards requiring dialysis.  No urgent indication for HD today.  Repeat lab in the morning.  #History of ESRD status post kidney transplant x3: The Prograf level came back 28.3, unknown if it was true trough but the level was very high.  I am holding Prograf.  Repeating Prograf trough today.  Continue azathioprine and currently on IV steroid.  Discussed with the patient..  # Anemia of CKD and acute illness: Iron saturation acceptable.  Hemoccult negative.  Received Aranesp on 4/7.   Received a unit of blood transfusion.  #Hypotension: On midodrine.  Monitor blood pressure.  #Metabolic acidosis: Started oral sodium bicarbonate.  Monitor.  #Nausea vomiting and abdominal pain: Symptoms improved today.   Her lactic acid level is not elevated.  Abdominal x-ray unremarkable.  CT scan unremarkable.   Continue supportive care.  Discussed with the primary team.  Subjective: Seen and examined bedside.  There was concern about lethargy.  However when I saw the patient she was alert awake and oriented x3.  Reports that her nausea vomiting is  improved.  Urine output is marginal.  She understand that she may be heading towards requiring dialysis.  Objective Vital signs in last 24 hours: Vitals:   05/13/19 1611 05/14/19 0000 05/14/19 0400 05/14/19 0800  BP:  (!) 142/80 129/74 118/66  Pulse: 80 82 80 81  Resp: (!) 22 (!) 23 20 (!) 21  Temp: 98 F (36.7 C) 98.9 F (37.2 C) 98.3 F (36.8 C) 98.3 F (36.8 C)  TempSrc: Oral Oral Oral Oral  SpO2: 94% 94% 91% 90%  Weight:      Height:       Weight change:   Intake/Output Summary (Last 24 hours) at 05/14/2019 1054 Last data filed at 05/14/2019 0942 Gross per 24 hour  Intake --  Output 325 ml  Net -325 ml       Labs: Basic Metabolic Panel: Recent Labs  Lab 05/12/19 0633 05/13/19 2039 05/14/19 0258  NA 141 142 143  K 4.2 4.2 3.8  CL 101 103 103  CO2 20* 21* 20*  GLUCOSE 98 119* 89  BUN 94* 110* 108*  CREATININE 5.62* 6.51* 6.25*  CALCIUM 9.0 9.5 9.5   Liver Function Tests: Recent Labs  Lab 05/12/19 0633 05/13/19 2039 05/14/19 0258  AST 29 25 22   ALT 21 24 18   ALKPHOS 107 111 106  BILITOT 0.9 1.2 0.9  PROT 5.6* 6.1* 6.0*  ALBUMIN 3.3* 3.6 3.5   Recent Labs  Lab 05/14/19 0258  LIPASE 50   No results for input(s): AMMONIA in the last 168 hours. CBC: Recent Labs  Lab 05/10/19 0321 05/10/19 0321 05/11/19 0308 05/11/19 0308 05/12/19 3875 05/13/19 1833 05/14/19 0258  WBC 3.2*   < >  2.4*   < > 2.8* 3.0* 3.0*  HGB 8.8*   < > 8.9*   < > 9.7* 9.6* 9.6*  HCT 26.8*   < > 27.1*   < > 30.5* 29.9* 30.0*  MCV 97.8  --  97.5  --  102.0* 100.7* 100.7*  PLT 157   < > 154   < > 185 156 163   < > = values in this interval not displayed.   Cardiac Enzymes: No results for input(s): CKTOTAL, CKMB, CKMBINDEX, TROPONINI in the last 168 hours. CBG: Recent Labs  Lab 05/11/19 0845 05/11/19 1206  GLUCAP 117* 113*    Iron Studies:  No results for input(s): IRON, TIBC, TRANSFERRIN, FERRITIN in the last 72 hours. Studies/Results: CT ABDOMEN PELVIS WO  CONTRAST  Result Date: 05/12/2019 CLINICAL DATA:  Abdominal pain and vomiting, COVID-19 positivity EXAM: CT ABDOMEN AND PELVIS WITHOUT CONTRAST TECHNIQUE: Multidetector CT imaging of the abdomen and pelvis was performed following the standard protocol without IV contrast. COMPARISON:  08/09/2017, 08/25/2018 FINDINGS: Lower chest: Bilateral pleural effusions are noted right greater than left with associated compressive atelectatic changes. Hepatobiliary: No focal liver abnormality is seen. No gallstones, gallbladder wall thickening, or biliary dilatation. Pancreas: Unremarkable. No pancreatic ductal dilatation or surrounding inflammatory changes. Spleen: Splenic granulomas are noted. Adrenals/Urinary Tract: Adrenal glands appear within normal limits. The native kidneys have been surgically removed. A transplant kidney is noted in the right pelvis. No obstructive changes are seen. 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 Stomach/Bowel: No obstructive or inflammatory changes of the bowel are noted. Vascular/Lymphatic: Vascular calcifications are noted. Some stable retroperitoneal lymph nodes are seen. Given their long-term stability there felt to be benign in etiology. Prominence of the azygos and hemi azygous system in the retrocrural space is noted. Reproductive: Uterus and bilateral adnexa are unremarkable. Other: Changes of diffuse anasarca.  Minimal ascites is seen. Musculoskeletal: No acute bony abnormality is noted. 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 IMPRESSION: Minimal ascites and mild changes of anasarca. Bilateral pleural effusions right greater than left with associated basilar atelectasis. 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 No acute abnormality is noted within the abdomen. Electronically Signed   By: Inez Catalina M.D.   On: 05/12/2019 14:51    Medications: Infusions: . cefTRIAXone (ROCEPHIN)  IV  1 g (05/14/19 0512)  . sodium chloride Stopped (05/09/19 0929)    Scheduled Medications: . vitamin C  500 mg Oral Daily  . aspirin EC  81 mg Oral Daily  . azaTHIOprine  100 mg Oral Daily  . azithromycin  500 mg Oral Daily  . folic acid  1 mg Oral Daily  . gabapentin  100 mg Oral BID  . heparin  5,000 Units Subcutaneous Q8H  . hydrocortisone cream   Topical BID  . latanoprost  1 drop Left Eye QHS  . methylPREDNISolone (SOLU-MEDROL) injection  40 mg Intravenous Daily  . midodrine  5 mg Oral BID WC  .  morphine injection  2 mg Intravenous Once  . nystatin  5 mL Oral QID  . pantoprazole  40 mg Oral Q1200  . sodium bicarbonate  650 mg Oral TID  . zinc sulfate  220 mg Oral Daily    have reviewed scheduled and prn medications.  Physical Exam: General: He, lying on bed comfortable, not in distress Heart:RRR, s1s2 nl Lungs: Clear b/l, no crackle Abdomen:soft, mild diffuse tenderness, non-distended Extremities:No LE edema Neurology:  Alert awake and following commands, oriented x3.  No asterixis or tremor.  Leticia Coletta Prasad Laurelle Skiver 05/14/2019,10:54 AM  LOS: 5 days  Pager: [phone]

## 2019-05-15 ENCOUNTER — Inpatient Hospital Stay (HOSPITAL_COMMUNITY): Payer: Medicaid Other

## 2019-05-15 ENCOUNTER — Encounter (HOSPITAL_COMMUNITY): Payer: Self-pay | Admitting: Internal Medicine

## 2019-05-15 LAB — CBC
HCT: 27.6 % — ABNORMAL LOW (ref 36.0–46.0)
Hemoglobin: 8.8 g/dL — ABNORMAL LOW (ref 12.0–15.0)
MCH: 32.6 pg (ref 26.0–34.0)
MCHC: 31.9 g/dL (ref 30.0–36.0)
MCV: 102.2 fL — ABNORMAL HIGH (ref 80.0–100.0)
Platelets: 157 10*3/uL (ref 150–400)
RBC: 2.7 MIL/uL — ABNORMAL LOW (ref 3.87–5.11)
RDW: 21.6 % — ABNORMAL HIGH (ref 11.5–15.5)
WBC: 2.9 10*3/uL — ABNORMAL LOW (ref 4.0–10.5)
nRBC: 3.4 % — ABNORMAL HIGH (ref 0.0–0.2)

## 2019-05-15 LAB — COMPREHENSIVE METABOLIC PANEL
ALT: 16 U/L (ref 0–44)
AST: 29 U/L (ref 15–41)
Albumin: 3.1 g/dL — ABNORMAL LOW (ref 3.5–5.0)
Alkaline Phosphatase: 93 U/L (ref 38–126)
Anion gap: 19 — ABNORMAL HIGH (ref 5–15)
BUN: 111 mg/dL — ABNORMAL HIGH (ref 6–20)
CO2: 17 mmol/L — ABNORMAL LOW (ref 22–32)
Calcium: 9.2 mg/dL (ref 8.9–10.3)
Chloride: 107 mmol/L (ref 98–111)
Creatinine, Ser: 5.26 mg/dL — ABNORMAL HIGH (ref 0.44–1.00)
GFR calc Af Amer: 10 mL/min — ABNORMAL LOW (ref >60)
GFR calc non Af Amer: 9 mL/min — ABNORMAL LOW (ref >60)
Glucose, Bld: 98 mg/dL (ref 70–99)
Potassium: 5.2 mmol/L — ABNORMAL HIGH (ref 3.5–5.1)
Sodium: 143 mmol/L (ref 135–145)
Total Bilirubin: 1.2 mg/dL (ref 0.3–1.2)
Total Protein: 5.4 g/dL — ABNORMAL LOW (ref 6.5–8.1)

## 2019-05-15 LAB — C-REACTIVE PROTEIN: CRP: 1.6 mg/dL — ABNORMAL HIGH (ref <1.0)

## 2019-05-15 MED ORDER — SODIUM BICARBONATE 650 MG PO TABS
1300.0000 mg | ORAL_TABLET | Freq: Three times a day (TID) | ORAL | Status: DC
  Administered 2019-05-15 – 2019-05-16 (×3): 1300 mg via ORAL
  Filled 2019-05-15 (×3): qty 2

## 2019-05-15 MED ORDER — SODIUM ZIRCONIUM CYCLOSILICATE 10 G PO PACK
10.0000 g | PACK | Freq: Once | ORAL | Status: AC
  Administered 2019-05-15: 10 g via ORAL
  Filled 2019-05-15: qty 1

## 2019-05-15 MED ORDER — METOCLOPRAMIDE HCL 5 MG/ML IJ SOLN
10.0000 mg | Freq: Three times a day (TID) | INTRAMUSCULAR | Status: DC
  Administered 2019-05-15 – 2019-05-17 (×6): 10 mg via INTRAVENOUS
  Filled 2019-05-15 (×6): qty 2

## 2019-05-15 MED ORDER — TACROLIMUS 1 MG PO CAPS
2.0000 mg | ORAL_CAPSULE | ORAL | Status: DC
  Administered 2019-05-15 – 2019-05-17 (×3): 2 mg via ORAL
  Filled 2019-05-15 (×4): qty 2

## 2019-05-15 MED ORDER — TACROLIMUS 1 MG PO CAPS
3.0000 mg | ORAL_CAPSULE | Freq: Every morning | ORAL | Status: DC
  Administered 2019-05-16 – 2019-05-18 (×3): 3 mg via ORAL
  Filled 2019-05-15 (×3): qty 3

## 2019-05-15 NOTE — Progress Notes (Signed)
Physical Therapy Treatment Patient Details Name: Tina Watkins MRN: [id number] DOB: [birth year] Today's Date: 05/15/2019    History of Present Illness Patient reports feeling poorly for 3 weeks with progressive dyspnea. CXR: LLL PNA. Patient given blood transfusion with good hemoglobin response. +COVID being treated wtih IV steroids and IV Remdesivir. IV Rocephin and Azithromycin to cover for bacterial infection also. Patient with anemia likely of chronic kidney disease as she has a history of ESRD s/p renal transplant. Nephrology following. Patient may be headed toward needing dialysis. PMH: renal transplant x3, CKD, chronic diastolic CHF, chronic anemia, chronic hypotension, liver cirrhosis, HTN, TIA    PT Comments    Patient progressing her mobility compared to PT evaluation last week. She is not tremulous upon standing and was able to walk a little ways in the hallway with RW and close supervision. She reports tightness R posterior hip/back due to fluid and how she was positioned in the bed. HR, O2 sat, RR stable on room air. Anticipate patient's mobility will continue to improve with skilled PT in hospital setting in order for patient to discharge home with home PT and HHA services. Plan to assess stair negotiation next session as patient has 7 STE her apartment.     Follow Up Recommendations  Home health PT;Supervision - Intermittent     Equipment Recommendations  Rolling walker with 5" wheels    Recommendations for Other Services OT recommending home OT and 24/7 supervision/assist     Precautions / Restrictions Precautions Precautions: Fall;Other (comment) Precaution Comments: COVID+ Restrictions Weight Bearing Restrictions: No    Mobility  Bed Mobility Overal bed mobility: Modified Independent Bed Mobility: Supine to Sit     Supine to sit: Modified independent (Device/Increase time);HOB elevated     General bed mobility comments: HOB elevated approx 80  degrees and use of bedrail. Patient has adjustable bed at home. Education on bedrail for home to increase independence and ease of bed mobility.  Transfers Overall transfer level: Needs assistance Equipment used: Rolling walker (2 wheeled);None Transfers: Sit to/from Stand Sit to Stand: Supervision;Modified independent (Device/Increase time)         General transfer comment: sit>stand from EOB with RW, sit<>stand from recliner chair without AD modI.  Ambulation/Gait Ambulation/Gait assistance: Min guard;Supervision Gait Distance (Feet): 60 Feet Assistive device: Rolling walker (2 wheeled) Gait Pattern/deviations: Decreased step length - right;Decreased step length - left;Decreased stance time - right;Decreased stance time - left(increased lateral sway, increased swing time RLE) Gait velocity: decreased   General Gait Details: Patient reports R posterior back/hip tightness due to fluid. Oxygen saturation 93% during gait trial.     Balance Overall balance assessment: Needs assistance Sitting-balance support: Feet supported Sitting balance-Leahy Scale: Good     Standing balance support: Bilateral upper extremity supported;No upper extremity supported Standing balance-Leahy Scale: Fair     Cognition Arousal/Alertness: Awake/alert Behavior During Therapy: WFL for tasks assessed/performed Overall Cognitive Status: Within Functional Limits for tasks assessed        General Comments General comments (skin integrity, edema, etc.): HR, O2 sat, RR stable on room air. Patient denies DOE.      Pertinent Vitals/Pain Pain Assessment: Faces Faces Pain Scale: Hurts little more Pain Location: abdomen, R posterior hip/back  Pain Descriptors / Indicators: Tightness Pain Intervention(s): Monitored during session;Limited activity within patient's tolerance           PT Goals (current goals can now be found in the care plan section) Progress towards PT goals: Progressing toward  goals  Frequency    Min 3X/week      PT Plan Current plan remains appropriate       AM-PAC PT "6 Clicks" Mobility   Outcome Measure  Help needed turning from your back to your side while in a flat bed without using bedrails?: None Help needed moving from lying on your back to sitting on the side of a flat bed without using bedrails?: A Little Help needed moving to and from a bed to a chair (including a wheelchair)?: A Little Help needed standing up from a chair using your arms (e.g., wheelchair or bedside chair)?: None Help needed to walk in hospital room?: A Little Help needed climbing 3-5 steps with a railing? : A Little 6 Click Score: 20    End of Session   Activity Tolerance: Patient tolerated treatment well Patient left: in chair;with call bell/phone within reach Nurse Communication: Mobility status;Other (comment)(via secure chat) PT Visit Diagnosis: Unsteadiness on feet (R26.81);Other abnormalities of gait and mobility (R26.89)     Time: 7793-9688 PT Time Calculation (min) (ACUTE ONLY): 25 min  Charges:  $Gait Training: 8-22 mins $Therapeutic Activity: 8-22 mins                     Birdie Hopes, PT, DPT Acute Rehab [phone] office     Birdie Hopes 05/15/2019, 11:38 AM

## 2019-05-15 NOTE — Plan of Care (Signed)
  Problem: Education: Goal: Knowledge of General Education information will improve Description Including pain rating scale, medication(s)/side effects and non-pharmacologic comfort measures Outcome: Progressing   Problem: Health Behavior/Discharge Planning: Goal: Ability to manage health-related needs will improve Outcome: Progressing   

## 2019-05-15 NOTE — Progress Notes (Signed)
Patient ID: Tina Watkins, female   DOB: 1965-04-23, 53 y.o.   MRN: [id number] [facility] Progress Note   Assessment/ Plan:   1. Acute kidney Injury on chronic kidney disease stage IV T (baseline creatinine ~3.8): With acute kidney injury likely associated with COVID-19 infection/SIRS.  She is barely nonoliguric but labs showing some improvement of creatinine overnight.  She does not have any uremic signs or symptoms or critical electrolyte abnormalities/volume excess to prompt dialysis.  The plan is to continue daily monitoring for recovery versus progression to dialysis.  If she does need dialysis, this will be through her dialysis catheter.  Recent Prograf level unfortunately was not a trough level drawn at 10 AM and likely invalid result.  Tacrolimus restarted. 2.  COVID-19 pneumonia: Completed intravenous remdesivir and now on intravenous corticosteroids along with CAP coverage with Rocephin/azithromycin.  Respiratory status fair and encouraged use of incentive spirometer. 3.  Hypotension: Suspected to be primarily from infection/volume depletion and improved with fluids/midodrine. 4.  Anemia: Chronic and associated with chronic kidney disease/inflammatory component of COVID-19 infection.  Status post 1 unit PRBC and Aranesp. 5.  Metabolic acidosis: Increase sodium bicarbonate and will treat with a single dose of Lokelma for management of mild hyperkalemia.  Subjective:   Reports to be feeling fair, informs me that her family has been praying hard for her recovery.   Objective:   BP 112/68 (BP Location: Right Arm)   Pulse (!) 53   Temp 97.8 F (36.6 C) (Oral)   Resp (!) 21   Ht 4\' 11"  (1.499 m)   Wt 52.2 kg   SpO2 90%   BMI 23.23 kg/m   Intake/Output Summary (Last 24 hours) at 05/15/2019 1237 Last data filed at 05/15/2019 0300 Gross per 24 hour  Intake 350 ml  Output 400 ml  Net -50 ml   Weight change:   Physical Exam: Gen: Appears to be comfortable  sitting up in recliner, watching video on phone.  Right eye opaque iris/lens. CVS: Pulse regular rhythm, normal rate, S1 and S2 normal Resp: Clear to auscultation bilaterally, no distinct rales or rhonchi Abd: Soft, obese, nontender, bowel sounds normal Ext: Trace-1+ left lower extremity edema, trace right lower extremity edema.  Thrombosed right upper extremity graft.  Imaging: No results found.  Labs: BMET Recent Labs  Lab 05/08/19 2133 05/10/19 0321 05/11/19 0308 05/12/19 3664 05/13/19 2039 05/14/19 0258 05/15/19 0327  NA 135 136 139 141 142 143 143  K 4.3 5.0 3.9 4.2 4.2 3.8 5.2*  CL 107 106 102 101 103 103 107  CO2 15* 11* 21* 20* 21* 20* 17*  GLUCOSE 108* 106* 122* 98 119* 89 98  BUN 90* 92* 91* 94* 110* 108* 111*  CREATININE 5.22* 4.99* 5.74* 5.62* 6.51* 6.25* 5.26*  CALCIUM 8.8* 9.1 8.7* 9.0 9.5 9.5 9.2   CBC Recent Labs  Lab 05/12/19 0633 05/13/19 1833 05/14/19 0258 05/15/19 0647  WBC 2.8* 3.0* 3.0* 2.9*  HGB 9.7* 9.6* 9.6* 8.8*  HCT 30.5* 29.9* 30.0* 27.6*  MCV 102.0* 100.7* 100.7* 102.2*  PLT 185 156 163 157    Medications:    . vitamin C  500 mg Oral Daily  . aspirin EC  81 mg Oral Daily  . azaTHIOprine  100 mg Oral Daily  . azithromycin  500 mg Oral Daily  . folic acid  1 mg Oral Daily  . gabapentin  100 mg Oral BID  . heparin  5,000 Units Subcutaneous Q8H  . hydrocortisone cream  Topical BID  . latanoprost  1 drop Left Eye QHS  . methylPREDNISolone (SOLU-MEDROL) injection  40 mg Intravenous Daily  . metoCLOPramide (REGLAN) injection  10 mg Intravenous Q8H  . midodrine  5 mg Oral BID WC  .  morphine injection  2 mg Intravenous Once  . nystatin  5 mL Oral QID  . pantoprazole  40 mg Oral Q1200  . sodium bicarbonate  650 mg Oral TID  . zinc sulfate  220 mg Oral Daily   Elmarie Shiley, MD 05/15/2019, 12:37 PM

## 2019-05-15 NOTE — Progress Notes (Signed)
PROGRESS NOTE                                                                                                                                                                                                             Patient Demographics:    Tina Watkins, is a 54 y.o. female, DOB - 1965/04/27, ASN:[id number]  Admit date - 05/08/2019   Admitting Physician Domenic Polite, MD  Outpatient Primary MD for the patient is [name], FNP  LOS - 6   Chief Complaint  Patient presents with  . Low Hemoglobin  . Hypotension       Brief Narrative    This is a chronically ill 54 year old female with history of renal transplant x3, CKD 4/5, chronic diastolic CHF, chronic anemia, chronic hypotension, liver cirrhosis on imaging was sent to the ED by her cardiologist due to worsening anemia,worsening kidney function and hypotension. Patient reports feeling poorly for 3 weeks, progressive dyspnea on exertion.  She is followed closely by Dr. Marval Regal with [facility] and [facility]. -While in the ED, she was afebrile, hypotensive with systolic blood pressure in the 70-80 range, hemoglobin was down to 6.7, Hemoccult negative, creatinine worsened at 5.2 from baseline of 3.5-3.7, in addition COVID-19 PCR was positive, chest x-ray notable for developing left lower lobe pneumonia, no evidence of hypoxemia. -No evidence of significant bleed, patient had nausea during first unit transfusion, so it has been continued, and she did receive another unit later, with good response and improvement of hemoglobin .    Subjective:    Tina Watkins today reports some nausea, but no vomiting, reports abdominal pain has improved .   Assessment  & Plan :    Principal Problem:   Pneumonia due to COVID-19 virus Active Problems:   AKI (acute kidney injury) ([place])   Hypotension   Anemia   CHF (congestive heart failure) ([place])   COVID-19 for pneumonia  . -Chest x-ray significant for left lung opacity . -There is no evidence of hypoxia. -Continue to trend inflammatory markers closely .  -Continue with IV steroids, currently on Solu-Medrol 40 mg IV once daily . -Treated with IV remdesivir. -Procalcitonin is elevated, patient is immunocompromise, treated with  IV Rocephin and azithromycin . -She was encouraged with incentive spirometry . COVID-19 Labs  Recent Labs    05/13/19  5784 05/14/19 0258 05/15/19 0327  DDIMER 3.17* 3.85*  --   CRP 1.3* 1.4* 1.6*    Lab Results  Component Value Date   SARSCOV2NAA POSITIVE (A) 05/09/2019   [place] NEGATIVE 03/17/2019     Hypotension: -Acute on chronic, multifactorial secondary to anemia, volume depletion and dehydration. -Started on low-dose midodrine -Received appropriate volume resuscitation with IV fluids and blood transfusions -Steroid-dependent, initially on IV hydrocortisone, currently on IV Solu-Medrol -Blood pressure improved  Anemia of chronic kidney disease -No evidence of significant blood loss, she is Hemoccult negative, this is most likely anemia of chronic kidney disease. -Hemoglobin 6.7 on presentation, responded to 1 unit PRBC transfusion with stable hemoglobin. -procrit per renal.  Abdominal pain/nausea/vomiting. -Nausea and vomiting most likely related to Covid, no further vomiting, nausea has improved, still reports some abdominal pain, CT abdomen pelvis without contrast was obtained, with no acute findings in abdomen.  -Lipase within normal limit. -Likely of uremia contributing to the symptoms. -As well possible gastroparesis, will start on scheduled Reglan 10 mg IV every 8 hours.  AKI on CKD stage IV in transplant Kidney :  - History of ESRD status post renal transplant complicated by CKD stage IV on chronic steroid and immunosuppressive therapy.   - Creatinine was ranging between 3.5-3.7 on recent labs, increased during hospital stay, getting has improved  today to 5.2, urine output is 625 cc over last 24 hours, will await further recommendation from renal to see if dialysis is indicated here . -Management per renal, they might consider hemodialysis, especially if there is concern about uremic symptoms . - continue with immunosuppressive therapy. -Normal protein, kidney ultrasound without hydronephrosis of transplanted kidney.  Metabolic acidosis -Resolved with bicarb drip.  Chronic diastolic congestive heart failure:  - No signs of volume overload at this time.  Hold diuretic given hypotension.  Oral thrush -She is on steroids, increased her nystatin mouthwash.   COVID-19 Labs  Recent Labs    05/13/19 1833 05/14/19 0258 05/15/19 0327  DDIMER 3.17* 3.85*  --   CRP 1.3* 1.4* 1.6*    Lab Results  Component Value Date   SARSCOV2NAA POSITIVE (A) 05/09/2019   [place] NEGATIVE 03/17/2019     Code Status : Inpatient  Family Communication  : D/W mother via phone 4/10.tried to call again on 4/11, unable to leave voice mail.  Disposition Plan  : home with HH.  Barriers For Discharge : On  IV steroids, worsening renal function, may need HD continues to worsen.  Consults  :  renal  Procedures  : none  DVT Prophylaxis  : [place] heparin  Lab Results  Component Value Date   PLT 157 05/15/2019    Antibiotics  :    Anti-infectives (From admission, onward)   Start     Dose/Rate Route Frequency Ordered Stop   05/12/19 1000  azithromycin (ZITHROMAX) tablet 500 mg     500 mg Oral Daily 05/11/19 0929 05/19/19 0959   05/11/19 0600  cefTRIAXone (ROCEPHIN) 1 g in sodium chloride 0.9 % 100 mL IVPB     1 g 200 mL/hr over 30 Minutes Intravenous Every 24 hours 05/10/19 0639 05/18/19 0559   05/10/19 1000  remdesivir 100 mg in sodium chloride 0.9 % 100 mL IVPB     100 mg 200 mL/hr over 30 Minutes Intravenous Daily 05/09/19 0646 05/13/19 0943   05/10/19 0800  azithromycin (ZITHROMAX) 500 mg in sodium chloride 0.9 % 250 mL IVPB   Status:  Discontinued  500 mg 250 mL/hr over 60 Minutes Intravenous Every 24 hours 05/10/19 0639 05/11/19 0929   05/10/19 0645  cefTRIAXone (ROCEPHIN) 1 g in sodium chloride 0.9 % 100 mL IVPB     1 g 200 mL/hr over 30 Minutes Intravenous  Once 05/10/19 0641 05/10/19 0900   05/09/19 0730  remdesivir 100 mg in sodium chloride 0.9 % 100 mL IVPB     100 mg 200 mL/hr over 30 Minutes Intravenous Every 30 min 05/09/19 0646 05/09/19 1017   05/09/19 0145  azithromycin (ZITHROMAX) 500 mg in sodium chloride 0.9 % 250 mL IVPB     500 mg 250 mL/hr over 60 Minutes Intravenous  Once 05/09/19 0134 05/09/19 0326   05/09/19 0145  cefTRIAXone (ROCEPHIN) 1 g in sodium chloride 0.9 % 100 mL IVPB     1 g 200 mL/hr over 30 Minutes Intravenous  Once 05/09/19 0134 05/09/19 0221        Objective:   Vitals:   05/14/19 2330 05/15/19 0000 05/15/19 0400 05/15/19 0750  BP: (!) 98/58 (!) 106/59 108/64 112/68  Pulse: (!) 53 62 (!) 51 (!) 53  Resp: 19 (!) 24 20 (!) 21  Temp: 98.8 F (37.1 C)  98.7 F (37.1 C) 97.8 F (36.6 C)  TempSrc: Oral  Oral Oral  SpO2:  93% 92% 90%  Weight:      Height:        Wt Readings from Last 3 Encounters:  05/09/19 52.2 kg  05/08/19 52.3 kg  04/19/19 54.3 kg     Intake/Output Summary (Last 24 hours) at 05/15/2019 1205 Last data filed at 05/15/2019 0300 Gross per 24 hour  Intake 350 ml  Output 400 ml  Net -50 ml     Physical Exam  Awake Alert, Oriented X 3, No new F.N deficits, Normal affect Right eye blindness Symmetrical Chest wall movement, Good air movement bilaterally, CTAB RRR,No Gallops,Rubs or new Murmurs, No Parasternal Heave +ve B.Sounds, Abd Soft, abdomen is much less tender today, No rebound - guarding or rigidity. No Cyanosis, Clubbing , edema, No new Rash or bruise      Data Review:    CBC Recent Labs  Lab 05/11/19 0308 05/12/19 6948 05/13/19 1833 05/14/19 0258 05/15/19 0647  WBC 2.4* 2.8* 3.0* 3.0* 2.9*  HGB 8.9* 9.7* 9.6* 9.6* 8.8*    HCT 27.1* 30.5* 29.9* 30.0* 27.6*  PLT 154 185 156 163 157  MCV 97.5 102.0* 100.7* 100.7* 102.2*  MCH 32.0 32.4 32.3 32.2 32.6  MCHC 32.8 31.8 32.1 32.0 31.9  RDW 21.9* 22.1* 21.6* 21.7* 21.6*    Chemistries  Recent Labs  Lab 05/09/19 1130 05/10/19 0321 05/11/19 0308 05/12/19 5462 05/13/19 2039 05/14/19 0258 05/15/19 0327  NA  --    < > 139 141 142 143 143  K  --    < > 3.9 4.2 4.2 3.8 5.2*  CL  --    < > 102 101 103 103 107  CO2  --    < > 21* 20* 21* 20* 17*  GLUCOSE  --    < > 122* 98 119* 89 98  BUN  --    < > 91* 94* 110* 108* 111*  CREATININE  --    < > 5.74* 5.62* 6.51* 6.25* 5.26*  CALCIUM  --    < > 8.7* 9.0 9.5 9.5 9.2  AST  --   --   --  29 25 22 29   ALT  --   --   --  21 24 18 16   ALKPHOS  --   --   --  107 111 106 93  BILITOT 0.8  --   --  0.9 1.2 0.9 1.2   < > = values in this interval not displayed.   ------------------------------------------------------------------------------------------------------------------ No results for input(s): CHOL, HDL, LDLCALC, TRIG, CHOLHDL, LDLDIRECT in the last 72 hours.  Lab Results  Component Value Date   HGBA1C 4.8 05/08/2019   ------------------------------------------------------------------------------------------------------------------ No results for input(s): TSH, T4TOTAL, T3FREE, THYROIDAB in the last 72 hours.  Invalid input(s): FREET3 ------------------------------------------------------------------------------------------------------------------ No results for input(s): VITAMINB12, FOLATE, FERRITIN, TIBC, IRON, RETICCTPCT in the last 72 hours.  Coagulation profile No results for input(s): INR, PROTIME in the last 168 hours.  Recent Labs    05/13/19 1833 05/14/19 0258  DDIMER 3.17* 3.85*    Cardiac Enzymes No results for input(s): CKMB, TROPONINI, MYOGLOBIN in the last 168 hours.  Invalid input(s):  CK ------------------------------------------------------------------------------------------------------------------    Component Value Date/Time   BNP 481.5 (H) 04/17/2019 1718    Inpatient Medications  Scheduled Meds: . vitamin C  500 mg Oral Daily  . aspirin EC  81 mg Oral Daily  . azaTHIOprine  100 mg Oral Daily  . azithromycin  500 mg Oral Daily  . folic acid  1 mg Oral Daily  . gabapentin  100 mg Oral BID  . heparin  5,000 Units Subcutaneous Q8H  . hydrocortisone cream   Topical BID  . latanoprost  1 drop Left Eye QHS  . methylPREDNISolone (SOLU-MEDROL) injection  40 mg Intravenous Daily  . midodrine  5 mg Oral BID WC  .  morphine injection  2 mg Intravenous Once  . nystatin  5 mL Oral QID  . pantoprazole  40 mg Oral Q1200  . sodium bicarbonate  650 mg Oral TID  . zinc sulfate  220 mg Oral Daily   Continuous Infusions: . cefTRIAXone (ROCEPHIN)  IV 1 g (05/15/19 7672)  . sodium chloride Stopped (05/09/19 0929)   PRN Meds:.acetaminophen, benzonatate, lidocaine, ondansetron (ZOFRAN) IV, oxyCODONE-acetaminophen **AND** oxyCODONE, promethazine **OR** promethazine **OR** promethazine  Micro Results Recent Results (from the past 240 hour(s))  Blood culture (routine x 2)     Status: None   Collection Time: 05/09/19 12:21 AM   Specimen: BLOOD RIGHT HAND  Result Value Ref Range Status   Specimen Description BLOOD RIGHT HAND  Final   Special Requests   Final    BOTTLES DRAWN AEROBIC AND ANAEROBIC Blood Culture results may not be optimal due to an inadequate volume of blood received in culture bottles   Culture   Final    NO GROWTH 5 DAYS Performed at [facility], [place] [address], [place], [place] [address]    Report Status 05/14/2019 FINAL  Final  SARS CORONAVIRUS 2 (TAT 6-24 HRS) Nasopharyngeal Nasopharyngeal Swab     Status: Abnormal   Collection Time: 05/09/19 12:25 AM   Specimen: Nasopharyngeal Swab  Result Value Ref Range Status   SARS Coronavirus 2 POSITIVE  (A) NEGATIVE Final    Comment: RESULT CALLED TO, READ BACK BY AND VERIFIED WITH: RN OSORIO BAILEY AT 0620 ON 05/09/2019 BY MESSAN HOUEGNIFIO (NOTE) SARS-CoV-2 target nucleic acids are DETECTED. The SARS-CoV-2 RNA is generally detectable in upper and lower respiratory specimens during the acute phase of infection. Positive results are indicative of the presence of SARS-CoV-2 RNA. Clinical correlation with patient history and other diagnostic information is  necessary to determine patient infection status. Positive results do not rule out bacterial  infection or co-infection with other viruses.  The expected result is Negative. Fact Sheet for Patients: SugarRoll.be Fact Sheet for Healthcare Providers: https://www.woods-mathews.com/ This test is not yet approved or cleared by the [place] FDA and  has been authorized for detection and/or diagnosis of SARS-CoV-2 by FDA under an Emergency Use Authorization (EUA). This EUA will remain  in effect (meaning this tes t can be used) for the duration of the COVID-19 declaration under Section 564(b)(1) of the Act, 21 U.S.C. section 360bbb-3(b)(1), unless the authorization is terminated or revoked sooner. Performed at [facility], [place] [address] [place], [place] [address]   Gram stain     Status: None   Collection Time: 05/09/19  5:26 AM   Specimen: Urine, Random  Result Value Ref Range Status   Specimen Description URINE, RANDOM  Final   Special Requests Immunocompromised  Final   Gram Stain   Final    WBC PRESENT,BOTH PMN AND MONONUCLEAR GRAM POSITIVE COCCI IN PAIRS GRAM VARIABLE ROD CYTOSPIN SMEAR Performed at [facility], [address], [place], [place] [address]    Report Status 05/09/2019 FINAL  Final  Urine culture     Status: Abnormal   Collection Time: 05/09/19  5:26 AM   Specimen: Urine, Random  Result Value Ref Range Status   Specimen Description URINE, RANDOM  Final    Special Requests Immunocompromised  Final   Culture (A)  Final    10,000 COLONIES/mL DIPHTHEROIDS(CORYNEBACTERIUM SPECIES) Standardized susceptibility testing for this organism is not available. Performed at [facility], [place] [address] [place], [place] [address]    Report Status 05/10/2019 FINAL  Final  Blood culture (routine x 2)     Status: None   Collection Time: 05/09/19  6:18 AM   Specimen: BLOOD RIGHT HAND  Result Value Ref Range Status   Specimen Description BLOOD RIGHT HAND  Final   Special Requests   Final    BOTTLES DRAWN AEROBIC AND ANAEROBIC Blood Culture results may not be optimal due to an inadequate volume of blood received in culture bottles   Culture   Final    NO GROWTH 5 DAYS Performed at [facility], [place] [address], [place], [place] [address]    Report Status 05/14/2019 FINAL  Final    Radiology Reports CT ABDOMEN PELVIS WO CONTRAST  Result Date: 05/12/2019 CLINICAL DATA:  Abdominal pain and vomiting, COVID-19 positivity EXAM: CT ABDOMEN AND PELVIS WITHOUT CONTRAST TECHNIQUE: Multidetector CT imaging of the abdomen and pelvis was performed following the standard protocol without IV contrast. COMPARISON:  08/09/2017, 08/25/2018 FINDINGS: Lower chest: Bilateral pleural effusions are noted right greater than left with associated compressive atelectatic changes. Hepatobiliary: No focal liver abnormality is seen. No gallstones, gallbladder wall thickening, or biliary dilatation. Pancreas: Unremarkable. No pancreatic ductal dilatation or surrounding inflammatory changes. Spleen: Splenic granulomas are noted. Adrenals/Urinary Tract: Adrenal glands appear within normal limits. The native kidneys have been surgically removed. A transplant kidney is noted in the right pelvis. No obstructive changes are seen. 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 Stomach/Bowel: No obstructive or inflammatory changes of the bowel are noted.  Vascular/Lymphatic: Vascular calcifications are noted. Some stable retroperitoneal lymph nodes are seen. Given their long-term stability there felt to be benign in etiology. Prominence of the azygos and hemi azygous system in the retrocrural space is noted. Reproductive: Uterus and bilateral adnexa are unremarkable. Other: Changes of diffuse anasarca.  Minimal ascites is  seen. Musculoskeletal: No acute bony abnormality is noted. 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 IMPRESSION: Minimal ascites and mild changes of anasarca. Bilateral pleural effusions right greater than left with associated basilar atelectasis. 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 No acute abnormality is noted within the abdomen. Electronically Signed   By: Inez Catalina M.D.   On: 05/12/2019 14:51   DG Chest 2 View  Result Date: 04/17/2019 CLINICAL DATA:  Dyspnea and weakness. EXAM: CHEST - 2 VIEW COMPARISON:  March 20, 2019. FINDINGS: There are stable mild cardiomegaly and superior vena cava stent graft placement. Increased interstitial opacity projects on the bilateral infrahilar regions with mild bronchial wall thickening, but without obscuration of the diaphragm or heart borders. There is persistent interstitial edema. There is no pneumothorax or obvious pleural effusion. Aortic knob calcified atherosclerosis and postsurgical change of lower cervical ACDF are present. Surgical clips project on the upper abdomen. IMPRESSION: Increased bilateral infrahilar bronchitis with nonspecific small airways disease which may be inflammatory, infectious or aspiration-related. No pneumonia. Stable cardiomegaly and postoperative changes with subtle pulmonary edema but no obvious pleural effusion. Electronically Signed   By: Revonda Humphrey   On: 04/17/2019 15:09   US Renal Transplant w/Doppler  Result Date: 05/10/2019 CLINICAL DATA:  Acute kidney injury EXAM: ULTRASOUND OF RENAL  TRANSPLANT WITH RENAL DOPPLER ULTRASOUND TECHNIQUE: Ultrasound examination of the renal transplant was performed with gray-scale, color and duplex doppler evaluation. COMPARISON:  August 12, 2017 FINDINGS: Transplant kidney location: RLQ Transplant Kidney: Renal measurements: 10.8 x 5.6 x 5 cm = volume: There is 167mL. Normal in size and parenchymal echogenicity. No evidence of mass or hydronephrosis. No peri-transplant fluid collection seen. Color flow in the main renal artery:  Yes Color flow in the main renal vein:  Yes Duplex Doppler Evaluation: Main Renal Artery Resistive Index: 0.82 Venous waveform in main renal vein:  Present Intrarenal resistive index in upper pole:  0.56 (normal 0.6-0.8; equivocal 0.8-0.9; abnormal >= 0.9) Intrarenal resistive index in lower pole: 0.55 (normal 0.6-0.8; equivocal 0.8-0.9; abnormal >= 0.9) Bladder: The bladder is decompressed which limits evaluation. Other findings:  None. IMPRESSION: 1. Renal transplant in the right lower quadrant without evidence for hydronephrosis. 2. Patent main renal artery and vein. 3. Relatively normal resistive indices without significant change from study in 2019. Electronically Signed   By: Constance Holster M.D.   On: 05/10/2019 18:51   US RENAL  Result Date: 05/09/2019 CLINICAL DATA:  AKI, renal transplant EXAM: RENAL / URINARY TRACT ULTRASOUND COMPLETE COMPARISON:  None. FINDINGS: Kidneys: Native kidneys are absent surgically. Right lower quadrant transplant kidney measures 10.7 x 4.9 x 4.8 cm = volume 131.5 mL. No hydronephrosis. Bladder: Appears normal for degree of bladder distention. Other: None. IMPRESSION: Bilateral nephrectomy with right lower quadrant transplant. No hydronephrosis. Electronically Signed   By: Macy Mis M.D.   On: 05/09/2019 15:10   DG Chest Portable 1 View  Result Date: 05/09/2019 CLINICAL DATA:  Hypotension. EXAM: PORTABLE CHEST 1 VIEW COMPARISON:  05/18/2019 FINDINGS: Cardiac silhouette is stable. Again noted  is an SVC stent. There is vascular congestion without overt pulmonary edema. There is a new dense airspace opacity at the left lung base concerning for pneumonia or aspiration. There is no pneumothorax. No significant pleural effusion. No acute osseous abnormality. IMPRESSION: Findings concerning for developing left lower lobe pneumonia in the appropriate clinical setting. Electronically Signed   By: Constance Holster M.D.   On: 05/09/2019 00:13  DG Abd Portable 1V  Result Date: 05/10/2019 CLINICAL DATA:  Abdominal pain. EXAM: PORTABLE ABDOMEN - 1 VIEW COMPARISON:  Plain films of the abdomen 06/17/2015. FINDINGS: The bowel gas pattern is normal. No radio-opaque calculi or other significant radiographic abnormality are seen. Multiple surgical clips in the pelvis and upper abdomen are unchanged. IMPRESSION: No acute finding. Electronically Signed   By: Inge Rise M.D.   On: 05/10/2019 13:55      Phillips Climes M.D on 05/15/2019 at 12:05 PM  Between 7am to 7pm - Pager - [phone]  After 7pm go to [email or web address] - password [facility]  [facility] -  Office  [phone]

## 2019-05-16 ENCOUNTER — Inpatient Hospital Stay (HOSPITAL_COMMUNITY): Payer: Medicaid Other

## 2019-05-16 DIAGNOSIS — D696 Thrombocytopenia, unspecified: Secondary | ICD-10-CM

## 2019-05-16 DIAGNOSIS — U071 COVID-19: Secondary | ICD-10-CM

## 2019-05-16 DIAGNOSIS — R7989 Other specified abnormal findings of blood chemistry: Secondary | ICD-10-CM

## 2019-05-16 LAB — CBC
HCT: 24.2 % — ABNORMAL LOW (ref 36.0–46.0)
Hemoglobin: 7.6 g/dL — ABNORMAL LOW (ref 12.0–15.0)
MCH: 32.1 pg (ref 26.0–34.0)
MCHC: 31.4 g/dL (ref 30.0–36.0)
MCV: 102.1 fL — ABNORMAL HIGH (ref 80.0–100.0)
Platelets: 81 10*3/uL — ABNORMAL LOW (ref 150–400)
RBC: 2.37 MIL/uL — ABNORMAL LOW (ref 3.87–5.11)
RDW: 21.5 % — ABNORMAL HIGH (ref 11.5–15.5)
WBC: 2.2 10*3/uL — ABNORMAL LOW (ref 4.0–10.5)
nRBC: 5.4 % — ABNORMAL HIGH (ref 0.0–0.2)

## 2019-05-16 LAB — COMPREHENSIVE METABOLIC PANEL
ALT: 17 U/L (ref 0–44)
AST: 33 U/L (ref 15–41)
Albumin: 3.3 g/dL — ABNORMAL LOW (ref 3.5–5.0)
Alkaline Phosphatase: 99 U/L (ref 38–126)
Anion gap: 14 (ref 5–15)
BUN: 92 mg/dL — ABNORMAL HIGH (ref 6–20)
CO2: 24 mmol/L (ref 22–32)
Calcium: 9 mg/dL (ref 8.9–10.3)
Chloride: 103 mmol/L (ref 98–111)
Creatinine, Ser: 3.97 mg/dL — ABNORMAL HIGH (ref 0.44–1.00)
GFR calc Af Amer: 14 mL/min — ABNORMAL LOW (ref >60)
GFR calc non Af Amer: 12 mL/min — ABNORMAL LOW (ref >60)
Glucose, Bld: 117 mg/dL — ABNORMAL HIGH (ref 70–99)
Potassium: 4.3 mmol/L (ref 3.5–5.1)
Sodium: 141 mmol/L (ref 135–145)
Total Bilirubin: 1.1 mg/dL (ref 0.3–1.2)
Total Protein: 5.5 g/dL — ABNORMAL LOW (ref 6.5–8.1)

## 2019-05-16 LAB — TACROLIMUS LEVEL: Tacrolimus (FK506) - LabCorp: 15.6 ng/mL (ref 2.0–20.0)

## 2019-05-16 LAB — C-REACTIVE PROTEIN: CRP: 1.6 mg/dL — ABNORMAL HIGH (ref <1.0)

## 2019-05-16 MED ORDER — SODIUM BICARBONATE 650 MG PO TABS
650.0000 mg | ORAL_TABLET | Freq: Three times a day (TID) | ORAL | Status: DC
  Administered 2019-05-16 – 2019-05-17 (×3): 650 mg via ORAL
  Filled 2019-05-16 (×3): qty 1

## 2019-05-16 MED ORDER — DARBEPOETIN ALFA 100 MCG/0.5ML IJ SOSY
100.0000 ug | PREFILLED_SYRINGE | INTRAMUSCULAR | Status: DC
  Administered 2019-05-17: 100 ug via SUBCUTANEOUS
  Filled 2019-05-16 (×2): qty 0.5

## 2019-05-16 MED ORDER — DEXAMETHASONE 6 MG PO TABS
6.0000 mg | ORAL_TABLET | Freq: Every day | ORAL | Status: DC
  Administered 2019-05-16 – 2019-05-17 (×2): 6 mg via ORAL
  Filled 2019-05-16: qty 1

## 2019-05-16 NOTE — Progress Notes (Signed)
Patient ID: Tina Watkins, female   DOB: 1965-08-05, 54 y.o.   MRN: [id number] [facility] Progress Note   Assessment/ Plan:   1. Acute kidney Injury on chronic kidney disease stage IV T (baseline creatinine ~3.8): With acute kidney injury likely associated with COVID-19 infection/SIRS.  Significant improvement of creatinine overnight however, urine output charted not very impressive.  She does not have any acute electrolyte abnormalities or uremic signs or symptoms to prompt intervention and agree with recommendation for monitoring her for the next 24 hours prior to considering discharge. 2.  COVID-19 pneumonia: Completed intravenous remdesivir and now on intravenous corticosteroids along with CAP coverage with Rocephin/azithromycin.  Respiratory status fair and encouraged use of incentive spirometer. 3.  Hypotension: Secondary to volume depletion/infection and improved with fluids/midodrine. 4.  Anemia: Chronic and associated with chronic kidney disease/inflammatory component of COVID-19 infection.  She has adequate iron stores and I will adjust Aranesp dose.  Thrombocytopenia noted-ongoing evaluation for HIT. 5.  Metabolic acidosis: Increased sodium bicarbonate supplementation overnight with better labs, will continue to follow labs.  Subjective:   Denies any complaints; without nausea/vomiting or significant dysgeusia..   Objective:   BP (!) 111/52 (BP Location: Right Wrist)   Pulse (!) 53   Temp 99.2 F (37.3 C) (Oral)   Resp 11   Ht 4\' 11"  (1.499 m)   Wt 52.2 kg   SpO2 94%   BMI 23.23 kg/m   Intake/Output Summary (Last 24 hours) at 05/16/2019 1138 Last data filed at 05/16/2019 1035 Gross per 24 hour  Intake 950 ml  Output 125 ml  Net 825 ml   Weight change:   Physical Exam: Gen: Comfortably sitting up in recliner.  Right eye opaque iris/lens. CVS: Pulse regular rhythm, normal rate, S1 and S2 normal Resp: Clear to auscultation bilaterally, no distinct  rales or rhonchi Abd: Soft, obese, nontender, bowel sounds normal Ext: Trace-1+ left lower extremity edema, trace right lower extremity edema.  Thrombosed right upper extremity graft.  Imaging: No results found.  Labs: BMET Recent Labs  Lab 05/10/19 0321 05/11/19 0308 05/12/19 4098 05/13/19 2039 05/14/19 0258 05/15/19 0327 05/16/19 0310  NA 136 139 141 142 143 143 141  K 5.0 3.9 4.2 4.2 3.8 5.2* 4.3  CL 106 102 101 103 103 107 103  CO2 11* 21* 20* 21* 20* 17* 24  GLUCOSE 106* 122* 98 119* 89 98 117*  BUN 92* 91* 94* 110* 108* 111* 92*  CREATININE 4.99* 5.74* 5.62* 6.51* 6.25* 5.26* 3.97*  CALCIUM 9.1 8.7* 9.0 9.5 9.5 9.2 9.0   CBC Recent Labs  Lab 05/13/19 1833 05/14/19 0258 05/15/19 0647 05/16/19 0310  WBC 3.0* 3.0* 2.9* 2.2*  HGB 9.6* 9.6* 8.8* 7.6*  HCT 29.9* 30.0* 27.6* 24.2*  MCV 100.7* 100.7* 102.2* 102.1*  PLT 156 163 157 81*    Medications:    . vitamin C  500 mg Oral Daily  . aspirin EC  81 mg Oral Daily  . azaTHIOprine  100 mg Oral Daily  . azithromycin  500 mg Oral Daily  . folic acid  1 mg Oral Daily  . gabapentin  100 mg Oral BID  . hydrocortisone cream   Topical BID  . latanoprost  1 drop Left Eye QHS  . methylPREDNISolone (SOLU-MEDROL) injection  40 mg Intravenous Daily  . metoCLOPramide (REGLAN) injection  10 mg Intravenous Q8H  . midodrine  5 mg Oral BID WC  .  morphine injection  2 mg Intravenous Once  .  nystatin  5 mL Oral QID  . pantoprazole  40 mg Oral Q1200  . sodium bicarbonate  1,300 mg Oral TID  . tacrolimus  2 mg Oral Q24H  . tacrolimus  3 mg Oral q AM  . zinc sulfate  220 mg Oral Daily   Elmarie Shiley, MD 05/16/2019, 11:38 AM

## 2019-05-16 NOTE — Plan of Care (Signed)
  Problem: Education: Goal: Knowledge of General Education information will improve Description Including pain rating scale, medication(s)/side effects and non-pharmacologic comfort measures Outcome: Progressing   Problem: Health Behavior/Discharge Planning: Goal: Ability to manage health-related needs will improve Outcome: Progressing   

## 2019-05-16 NOTE — Progress Notes (Signed)
Occupational Therapy Treatment Patient Details Name: Tina Watkins MRN: [id number] DOB: 1965/10/06 Today's Date: 05/16/2019    History of present illness Patient reports feeling poorly for 3 weeks with progressive dyspnea. CXR: LLL PNA. Patient given blood transfusion with good hemoglobin response. +COVID being treated wtih IV steroids and IV Remdesivir. IV Rocephin and Azithromycin to cover for bacterial infection also. Patient with anemia likely of chronic kidney disease as she has a history of ESRD s/p renal transplant. Nephrology following. Patient may be headed toward needing dialysis. PMH: renal transplant x3, CKD, chronic diastolic CHF, chronic anemia, chronic hypotension, liver cirrhosis, HTN, TIA   OT comments  Pt progressing with OT goals and doing much better this session. Pt Modified Independent for bed mobility to sit EOB, Supervision for sit to stand transfers with and without RW. Pt performed short distance mobility without AD to bathroom at min guard, progressed to supervision for toilet transfer, toileting task, and hand hygiene while standing at sink. Pt reported desire for hallway distance mobility using RW - overall supervision for this task and one seated rest break required. Pt expressed interest in trying quad cane during next session (reports SPC at home does not provide enough stability). Pt requested to sit in recliner chair at end of session.    Follow Up Recommendations  Home health OT;Supervision - Intermittent    Equipment Recommendations  Other (comment)(TBD)    Recommendations for Other Services      Precautions / Restrictions Precautions Precautions: Fall;Other (comment) Precaution Comments: COVID+ Restrictions Weight Bearing Restrictions: No       Mobility Bed Mobility Overal bed mobility: Modified Independent Bed Mobility: Supine to Sit     Supine to sit: Modified independent (Device/Increase time);HOB elevated         Transfers Overall transfer level: Needs assistance Equipment used: Rolling walker (2 wheeled);None Transfers: Sit to/from [employer] to Stand: Supervision Stand pivot transfers: Min guard       General transfer comment: Supervision for sit to stand, min guard progressing to supervision for stand pivots with RW    Balance Overall balance assessment: Needs assistance Sitting-balance support: Feet supported Sitting balance-Leahy Scale: Good     Standing balance support: Bilateral upper extremity supported;No upper extremity supported Standing balance-Leahy Scale: Fair                             ADL either performed or assessed with clinical judgement   ADL Overall ADL's : Needs assistance/impaired     Grooming: Supervision/safety;Standing;Wash/dry hands               Lower Body Dressing: Moderate assistance;Sitting/lateral leans;Sit to/from stand Lower Body Dressing Details (indicate cue type and reason): Max A to don socks - reports wearing slippers at baseline  Toilet Transfer: Min guard;Ambulation;Regular Toilet   Toileting- Clothing Manipulation and Hygiene: Supervision/safety;Sitting/lateral lean       Functional mobility during ADLs: Min guard;Supervision/safety;Rolling walker General ADL Comments: Pt with improved ADL and mobility status today compared to evaluation. Pt still limited by decreased strength, endurance, and abdominal pain     Vision       Perception     Praxis      Cognition Arousal/Alertness: Awake/alert Behavior During Therapy: WFL for tasks assessed/performed Overall Cognitive Status: Within Functional Limits for tasks assessed  Exercises     Shoulder Instructions       General Comments VSS on RA    Pertinent Vitals/ Pain       Pain Assessment: Faces Faces Pain Scale: Hurts a little bit Pain Location: abdomen Pain Descriptors /  Indicators: Tightness Pain Intervention(s): Monitored during session;Limited activity within patient's tolerance  Home Living                                          Prior Functioning/Environment              Frequency  Min 2X/week        Progress Toward Goals  OT Goals(current goals can now be found in the care plan section)  Progress towards OT goals: Progressing toward goals  Acute Rehab OT Goals Patient Stated Goal: feel better OT Goal Formulation: With patient Time For Goal Achievement: 05/26/19 Potential to Achieve Goals: Good ADL Goals Pt Will Perform Grooming: with modified independence;standing Pt Will Perform Lower Body Bathing: with set-up;sit to/from stand;sitting/lateral leans Pt Will Perform Lower Body Dressing: with set-up;sit to/from stand;sitting/lateral leans Pt Will Transfer to Toilet: with supervision;ambulating;regular height toilet Pt Will Perform Toileting - Clothing Manipulation and hygiene: with supervision;sit to/from stand;sitting/lateral leans Pt Will Perform Tub/Shower Transfer: with supervision;Stand pivot transfer  Plan Discharge plan remains appropriate;Frequency needs to be updated    Co-evaluation                 AM-PAC OT "6 Clicks" Daily Activity     Outcome Measure   Help from another person eating meals?: None Help from another person taking care of personal grooming?: A Little Help from another person toileting, which includes using toliet, bedpan, or urinal?: A Little Help from another person bathing (including washing, rinsing, drying)?: A Little Help from another person to put on and taking off regular upper body clothing?: A Little Help from another person to put on and taking off regular lower body clothing?: A Little 6 Click Score: 19    End of Session Equipment Utilized During Treatment: Gait belt;Rolling walker  OT Visit Diagnosis: Unsteadiness on feet (R26.81);Other abnormalities of gait  and mobility (R26.89);Muscle weakness (generalized) (M62.81)   Activity Tolerance Patient tolerated treatment well   Patient Left in chair;with call bell/phone within reach   Nurse Communication          Time: 5003-7048 OT Time Calculation (min): 25 min  Charges: OT General Charges $OT Visit: 1 Visit OT Treatments $Self Care/Home Management : 8-22 mins $Therapeutic Activity: 8-22 mins  Layla Maw, OTR/L   Layla Maw 05/16/2019, 3:59 PM

## 2019-05-16 NOTE — Progress Notes (Signed)
PROGRESS NOTE                                                                                                                                                                                                             Patient Demographics:    Tina Watkins, is a 54 y.o. female, DOB - 17-Jan-1966, GBT:[id number]  Admit date - 05/08/2019   Admitting Physician Domenic Polite, MD  Outpatient Primary MD for the patient is [name], FNP  LOS - 7   Chief Complaint  Patient presents with  . Low Hemoglobin  . Hypotension       Brief Narrative    This is a chronically ill 54 year old female with history of renal transplant x3, CKD 4/5, chronic diastolic CHF, chronic anemia, chronic hypotension, liver cirrhosis on imaging was sent to the ED by her cardiologist due to worsening anemia,worsening kidney function and hypotension. Patient reports feeling poorly for 3 weeks, progressive dyspnea on exertion.  She is followed closely by Dr. Marval Regal with [facility] and [facility] heart care. -While in the ED, she was afebrile, hypotensive with systolic blood pressure in the 70-80 range, hemoglobin was down to 6.7, Hemoccult negative, creatinine worsened at 5.2 from baseline of 3.5-3.7, in addition COVID-19 PCR was positive, chest x-ray notable for developing left lower lobe pneumonia, no evidence of hypoxemia. -No evidence of significant bleed, patient had nausea during first unit transfusion, so it has been continued, and she did receive another unit later, with good response and improvement of hemoglobin .    Subjective:    Tina Watkins today reports he is feeling better, appetite has improved, no vomiting, less nausea, abdominal pain significantly subsided .   Assessment  & Plan :    Principal Problem:   Pneumonia due to COVID-19 virus Active Problems:   AKI (acute kidney injury) ([place])   Hypotension   Anemia   CHF (congestive heart  failure) ([place])   COVID-19 for pneumonia . -Chest x-ray significant for left lung opacity . -There is no evidence of hypoxia. -Continue to trend inflammatory markers closely .  Significantly elevated D-dimers, venous Doppler was obtained, no evidence of DVT. -Continue with steroids, change IV Solu-Medrol to Decadron. -Treated with IV remdesivir. -Procalcitonin is elevated, patient is immunocompromise, treated with  IV Rocephin and azithromycin . -She was encouraged with incentive  spirometry . COVID-19 Labs  Recent Labs    05/13/19 1833 05/13/19 1833 05/14/19 0258 05/15/19 0327 05/16/19 0310  DDIMER 3.17*  --  3.85*  --   --   CRP 1.3*   < > 1.4* 1.6* 1.6*   < > = values in this interval not displayed.    Lab Results  Component Value Date   SARSCOV2NAA POSITIVE (A) 05/09/2019   [place] NEGATIVE 03/17/2019     Hypotension: -Acute on chronic, multifactorial secondary to anemia, volume depletion and dehydration. -Started on low-dose midodrine -Received appropriate volume resuscitation with IV fluids and blood transfusions -Steroid-dependent, initially on IV hydrocortisone. -Blood pressure improved  Anemia of chronic kidney disease -No evidence of significant blood loss, she is Hemoccult negative, this is most likely anemia of chronic kidney disease. -Hemoglobin 6.7 on presentation, responded to 1 unit PRBC transfusion with stable hemoglobin. -procrit per renal.  No indication for IV iron as discussed with renal.  Abdominal pain/nausea/vomiting. -Patient presents with abdominal pain, nausea and vomiting, has not been intermittent during hospital stay, thought to be initially secondary to Covid, but persisted, CT abdomen pelvis without contrast was obtained , there is no acute findings . -This was thought initially to be due to uremia, but this appears to be unlikely . -Is most likely due to gastroparesis, as symptoms have significantly improved after starting scheduled  IV Reglan .  AKI on CKD stage IV in transplant Kidney :  - History of ESRD status post renal transplant complicated by CKD stage IV on chronic steroid and immunosuppressive therapy.   - Creatinine was ranging between 3.5-3.7 on recent labs, 2, improving, this morning at 3.9. -Creatinine and BUN improving, no indication for dialysis currently. -Management per renal, they might consider hemodialysis, especially if there is concern about uremic symptoms . - continue with immunosuppressive therapy. -Normal protein, kidney ultrasound without hydronephrosis of transplanted kidney.  Thrombocytopenia -Platelet dropped to 81K today, most likely due to Covid, will DC her heparin, will check HIT, will start SCD for DVT prophylaxis.  Metabolic acidosis -Resolved with bicarb drip.  Chronic diastolic congestive heart failure:  - No signs of volume overload at this time.  Hold diuretic given hypotension.  Oral thrush -She is on steroids, increased her nystatin mouthwash.   COVID-19 Labs  Recent Labs    05/13/19 1833 05/13/19 1833 05/14/19 0258 05/15/19 0327 05/16/19 0310  DDIMER 3.17*  --  3.85*  --   --   CRP 1.3*   < > 1.4* 1.6* 1.6*   < > = values in this interval not displayed.    Lab Results  Component Value Date   SARSCOV2NAA POSITIVE (A) 05/09/2019   [place] NEGATIVE 03/17/2019     Code Status : Inpatient  Family Communication  : D/W mother via phone 4/13  Disposition Plan  : home with [facility].  Barriers For Discharge : On  IV steroids, now with thrombocytopenia.  Consults  :  renal  Procedures  : none  DVT Prophylaxis  : [place] heparin>> SCD  Lab Results  Component Value Date   PLT 81 (L) 05/16/2019    Antibiotics  :    Anti-infectives (From admission, onward)   Start     Dose/Rate Route Frequency Ordered Stop   05/12/19 1000  azithromycin (ZITHROMAX) tablet 500 mg     500 mg Oral Daily 05/11/19 0929 05/19/19 0959   05/11/19 0600  cefTRIAXone (ROCEPHIN) 1 g  in sodium chloride 0.9 % 100 mL IVPB  1 g 200 mL/hr over 30 Minutes Intravenous Every 24 hours 05/10/19 0639 05/18/19 0559   05/10/19 1000  remdesivir 100 mg in sodium chloride 0.9 % 100 mL IVPB     100 mg 200 mL/hr over 30 Minutes Intravenous Daily 05/09/19 0646 05/13/19 0943   05/10/19 0800  azithromycin (ZITHROMAX) 500 mg in sodium chloride 0.9 % 250 mL IVPB  Status:  Discontinued     500 mg 250 mL/hr over 60 Minutes Intravenous Every 24 hours 05/10/19 0639 05/11/19 0929   05/10/19 0645  cefTRIAXone (ROCEPHIN) 1 g in sodium chloride 0.9 % 100 mL IVPB     1 g 200 mL/hr over 30 Minutes Intravenous  Once 05/10/19 0641 05/10/19 0900   05/09/19 0730  remdesivir 100 mg in sodium chloride 0.9 % 100 mL IVPB     100 mg 200 mL/hr over 30 Minutes Intravenous Every 30 min 05/09/19 0646 05/09/19 1017   05/09/19 0145  azithromycin (ZITHROMAX) 500 mg in sodium chloride 0.9 % 250 mL IVPB     500 mg 250 mL/hr over 60 Minutes Intravenous  Once 05/09/19 0134 05/09/19 0326   05/09/19 0145  cefTRIAXone (ROCEPHIN) 1 g in sodium chloride 0.9 % 100 mL IVPB     1 g 200 mL/hr over 30 Minutes Intravenous  Once 05/09/19 0134 05/09/19 0221        Objective:   Vitals:   05/15/19 2039 05/15/19 2352 05/16/19 0501 05/16/19 1257  BP: 119/71 (!) 123/57 (!) 111/52 (!) 110/56  Pulse: (!) 57 (!) 55 (!) 53 (!) 57  Resp: (!) 21 (!) 24 11 15   Temp: 97.9 F (36.6 C) 98.5 F (36.9 C) 99.2 F (37.3 C) 98.1 F (36.7 C)  TempSrc: Oral Oral Oral Oral  SpO2: 98% 94% 94% 93%  Weight:      Height:        Wt Readings from Last 3 Encounters:  05/09/19 52.2 kg  05/08/19 52.3 kg  04/19/19 54.3 kg     Intake/Output Summary (Last 24 hours) at 05/16/2019 1324 Last data filed at 05/16/2019 1035 Gross per 24 hour  Intake 950 ml  Output 125 ml  Net 825 ml     Physical Exam  Awake Alert, Oriented X 3, No new F.N deficits, Normal affect Patient is legally blind. Symmetrical Chest wall movement, Good air movement  bilaterally, CTAB RRR,No Gallops,Rubs or new Murmurs, No Parasternal Heave +ve B.Sounds, Abd Soft, Abd tenderness has resolved, No rebound - guarding or rigidity. No Cyanosis, Clubbing or edema, No new Rash or bruise      Data Review:    CBC Recent Labs  Lab 05/12/19 0633 05/13/19 1833 05/14/19 0258 05/15/19 0647 05/16/19 0310  WBC 2.8* 3.0* 3.0* 2.9* 2.2*  HGB 9.7* 9.6* 9.6* 8.8* 7.6*  HCT 30.5* 29.9* 30.0* 27.6* 24.2*  PLT 185 156 163 157 81*  MCV 102.0* 100.7* 100.7* 102.2* 102.1*  MCH 32.4 32.3 32.2 32.6 32.1  MCHC 31.8 32.1 32.0 31.9 31.4  RDW 22.1* 21.6* 21.7* 21.6* 21.5*    Chemistries  Recent Labs  Lab 05/12/19 0633 05/13/19 2039 05/14/19 0258 05/15/19 0327 05/16/19 0310  NA 141 142 143 143 141  K 4.2 4.2 3.8 5.2* 4.3  CL 101 103 103 107 103  CO2 20* 21* 20* 17* 24  GLUCOSE 98 119* 89 98 117*  BUN 94* 110* 108* 111* 92*  CREATININE 5.62* 6.51* 6.25* 5.26* 3.97*  CALCIUM 9.0 9.5 9.5 9.2 9.0  AST 29 25 22 29  33  ALT 21 24 18 16 17   ALKPHOS 107 111 106 93 99  BILITOT 0.9 1.2 0.9 1.2 1.1   ------------------------------------------------------------------------------------------------------------------ No results for input(s): CHOL, HDL, LDLCALC, TRIG, CHOLHDL, LDLDIRECT in the last 72 hours.  Lab Results  Component Value Date   HGBA1C 4.8 05/08/2019   ------------------------------------------------------------------------------------------------------------------ No results for input(s): TSH, T4TOTAL, T3FREE, THYROIDAB in the last 72 hours.  Invalid input(s): FREET3 ------------------------------------------------------------------------------------------------------------------ No results for input(s): VITAMINB12, FOLATE, FERRITIN, TIBC, IRON, RETICCTPCT in the last 72 hours.  Coagulation profile No results for input(s): INR, PROTIME in the last 168 hours.  Recent Labs    05/13/19 1833 05/14/19 0258  DDIMER 3.17* 3.85*    Cardiac  Enzymes No results for input(s): CKMB, TROPONINI, MYOGLOBIN in the last 168 hours.  Invalid input(s): CK ------------------------------------------------------------------------------------------------------------------    Component Value Date/Time   BNP 481.5 (H) 04/17/2019 1718    Inpatient Medications  Scheduled Meds: . vitamin C  500 mg Oral Daily  . aspirin EC  81 mg Oral Daily  . azaTHIOprine  100 mg Oral Daily  . azithromycin  500 mg Oral Daily  . [START ON 05/17/2019] darbepoetin (ARANESP) injection - NON-DIALYSIS  100 mcg Subcutaneous Q Wed-1800  . folic acid  1 mg Oral Daily  . gabapentin  100 mg Oral BID  . hydrocortisone cream   Topical BID  . latanoprost  1 drop Left Eye QHS  . methylPREDNISolone (SOLU-MEDROL) injection  40 mg Intravenous Daily  . metoCLOPramide (REGLAN) injection  10 mg Intravenous Q8H  . midodrine  5 mg Oral BID WC  .  morphine injection  2 mg Intravenous Once  . nystatin  5 mL Oral QID  . pantoprazole  40 mg Oral Q1200  . sodium bicarbonate  650 mg Oral TID  . tacrolimus  2 mg Oral Q24H  . tacrolimus  3 mg Oral q AM  . zinc sulfate  220 mg Oral Daily   Continuous Infusions: . cefTRIAXone (ROCEPHIN)  IV 1 g (05/16/19 0612)  . sodium chloride Stopped (05/09/19 0929)   PRN Meds:.acetaminophen, benzonatate, lidocaine, ondansetron (ZOFRAN) IV, oxyCODONE-acetaminophen **AND** oxyCODONE, promethazine **OR** promethazine **OR** promethazine  Micro Results Recent Results (from the past 240 hour(s))  Blood culture (routine x 2)     Status: None   Collection Time: 05/09/19 12:21 AM   Specimen: BLOOD RIGHT HAND  Result Value Ref Range Status   Specimen Description BLOOD RIGHT HAND  Final   Special Requests   Final    BOTTLES DRAWN AEROBIC AND ANAEROBIC Blood Culture results may not be optimal due to an inadequate volume of blood received in culture bottles   Culture   Final    NO GROWTH 5 DAYS Performed at [facility], [place] [address],  [place], [place] [address]    Report Status 05/14/2019 FINAL  Final  SARS CORONAVIRUS 2 (TAT 6-24 HRS) Nasopharyngeal Nasopharyngeal Swab     Status: Abnormal   Collection Time: 05/09/19 12:25 AM   Specimen: Nasopharyngeal Swab  Result Value Ref Range Status   SARS Coronavirus 2 POSITIVE (A) NEGATIVE Final    Comment: RESULT CALLED TO, READ BACK BY AND VERIFIED WITH: RN OSORIO BAILEY AT 0620 ON 05/09/2019 BY MESSAN HOUEGNIFIO (NOTE) SARS-CoV-2 target nucleic acids are DETECTED. The SARS-CoV-2 RNA is generally detectable in upper and lower respiratory specimens during the acute phase of infection. Positive results are indicative of the presence of SARS-CoV-2 RNA. Clinical correlation with patient history and other diagnostic information is  necessary  to determine patient infection status. Positive results do not rule out bacterial infection or co-infection with other viruses.  The expected result is Negative. Fact Sheet for Patients: SugarRoll.be Fact Sheet for Healthcare Providers: https://www.woods-mathews.com/ This test is not yet approved or cleared by the [place] FDA and  has been authorized for detection and/or diagnosis of SARS-CoV-2 by FDA under an Emergency Use Authorization (EUA). This EUA will remain  in effect (meaning this tes t can be used) for the duration of the COVID-19 declaration under Section 564(b)(1) of the Act, 21 U.S.C. section 360bbb-3(b)(1), unless the authorization is terminated or revoked sooner. Performed at [facility], [place] [address], [place], [place] [address]   Gram stain     Status: None   Collection Time: 05/09/19  5:26 AM   Specimen: Urine, Random  Result Value Ref Range Status   Specimen Description URINE, RANDOM  Final   Special Requests Immunocompromised  Final   Gram Stain   Final    WBC PRESENT,BOTH PMN AND MONONUCLEAR GRAM POSITIVE COCCI IN PAIRS GRAM VARIABLE ROD CYTOSPIN SMEAR Performed  at [facility], [address] [place], [place] [address]    Report Status 05/09/2019 FINAL  Final  Urine culture     Status: Abnormal   Collection Time: 05/09/19  5:26 AM   Specimen: Urine, Random  Result Value Ref Range Status   Specimen Description URINE, RANDOM  Final   Special Requests Immunocompromised  Final   Culture (A)  Final    10,000 COLONIES/mL DIPHTHEROIDS(CORYNEBACTERIUM SPECIES) Standardized susceptibility testing for this organism is not available. Performed at [facility], [place] [address], [place], [place] [address]    Report Status 05/10/2019 FINAL  Final  Blood culture (routine x 2)     Status: None   Collection Time: 05/09/19  6:18 AM   Specimen: BLOOD RIGHT HAND  Result Value Ref Range Status   Specimen Description BLOOD RIGHT HAND  Final   Special Requests   Final    BOTTLES DRAWN AEROBIC AND ANAEROBIC Blood Culture results may not be optimal due to an inadequate volume of blood received in culture bottles   Culture   Final    NO GROWTH 5 DAYS Performed at [facility], [place] [address] [place], [place] [address]    Report Status 05/14/2019 FINAL  Final    Radiology Reports CT ABDOMEN PELVIS WO CONTRAST  Result Date: 05/12/2019 CLINICAL DATA:  Abdominal pain and vomiting, COVID-19 positivity EXAM: CT ABDOMEN AND PELVIS WITHOUT CONTRAST TECHNIQUE: Multidetector CT imaging of the abdomen and pelvis was performed following the standard protocol without IV contrast. COMPARISON:  08/09/2017, 08/25/2018 FINDINGS: Lower chest: Bilateral pleural effusions are noted right greater than left with associated compressive atelectatic changes. Hepatobiliary: No focal liver abnormality is seen. No gallstones, gallbladder wall thickening, or biliary dilatation. Pancreas: Unremarkable. No pancreatic ductal dilatation or surrounding inflammatory changes. Spleen: Splenic granulomas are noted. Adrenals/Urinary Tract: Adrenal glands appear within normal limits. The  native kidneys have been surgically removed. A transplant kidney is noted in the right pelvis. No obstructive changes are seen. 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 Stomach/Bowel: No obstructive or inflammatory changes of the bowel are noted. Vascular/Lymphatic: Vascular calcifications are noted. Some stable retroperitoneal lymph nodes are seen. Given their long-term stability there felt to be benign in etiology. Prominence of the azygos and hemi azygous system in the retrocrural space is noted. Reproductive: Uterus and bilateral  adnexa are unremarkable. Other: Changes of diffuse anasarca.  Minimal ascites is seen. Musculoskeletal: No acute bony abnormality is noted. 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 IMPRESSION: Minimal ascites and mild changes of anasarca. Bilateral pleural effusions right greater than left with associated basilar atelectasis. 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 No acute abnormality is noted within the abdomen. Electronically Signed   By: Inez Catalina M.D.   On: 05/12/2019 14:51   DG Chest 2 View  Result Date: 04/17/2019 CLINICAL DATA:  Dyspnea and weakness. EXAM: CHEST - 2 VIEW COMPARISON:  March 20, 2019. FINDINGS: There are stable mild cardiomegaly and superior vena cava stent graft placement. Increased interstitial opacity projects on the bilateral infrahilar regions with mild bronchial wall thickening, but without obscuration of the diaphragm or heart borders. There is persistent interstitial edema. There is no pneumothorax or obvious pleural effusion. Aortic knob calcified atherosclerosis and postsurgical change of lower cervical ACDF are present. Surgical clips project on the upper abdomen. IMPRESSION: Increased bilateral infrahilar bronchitis with nonspecific small airways disease which may be inflammatory, infectious or aspiration-related. No pneumonia. Stable cardiomegaly  and postoperative changes with subtle pulmonary edema but no obvious pleural effusion. Electronically Signed   By: Revonda Humphrey   On: 04/17/2019 15:09   US Renal Transplant w/Doppler  Result Date: 05/10/2019 CLINICAL DATA:  Acute kidney injury EXAM: ULTRASOUND OF RENAL TRANSPLANT WITH RENAL DOPPLER ULTRASOUND TECHNIQUE: Ultrasound examination of the renal transplant was performed with gray-scale, color and duplex doppler evaluation. COMPARISON:  August 12, 2017 FINDINGS: Transplant kidney location: RLQ Transplant Kidney: Renal measurements: 10.8 x 5.6 x 5 cm = volume: There is 138mL. Normal in size and parenchymal echogenicity. No evidence of mass or hydronephrosis. No peri-transplant fluid collection seen. Color flow in the main renal artery:  Yes Color flow in the main renal vein:  Yes Duplex Doppler Evaluation: Main Renal Artery Resistive Index: 0.82 Venous waveform in main renal vein:  Present Intrarenal resistive index in upper pole:  0.56 (normal 0.6-0.8; equivocal 0.8-0.9; abnormal >= 0.9) Intrarenal resistive index in lower pole: 0.55 (normal 0.6-0.8; equivocal 0.8-0.9; abnormal >= 0.9) Bladder: The bladder is decompressed which limits evaluation. Other findings:  None. IMPRESSION: 1. Renal transplant in the right lower quadrant without evidence for hydronephrosis. 2. Patent main renal artery and vein. 3. Relatively normal resistive indices without significant change from study in 2019. Electronically Signed   By: Constance Holster M.D.   On: 05/10/2019 18:51   US RENAL  Result Date: 05/09/2019 CLINICAL DATA:  AKI, renal transplant EXAM: RENAL / URINARY TRACT ULTRASOUND COMPLETE COMPARISON:  None. FINDINGS: Kidneys: Native kidneys are absent surgically. Right lower quadrant transplant kidney measures 10.7 x 4.9 x 4.8 cm = volume 131.5 mL. No hydronephrosis. Bladder: Appears normal for degree of bladder distention. Other: None. IMPRESSION: Bilateral nephrectomy with right lower quadrant transplant. No  hydronephrosis. Electronically Signed   By: Macy Mis M.D.   On: 05/09/2019 15:10   DG Chest Portable 1 View  Result Date: 05/09/2019 CLINICAL DATA:  Hypotension. EXAM: PORTABLE CHEST 1 VIEW COMPARISON:  05/18/2019 FINDINGS: Cardiac silhouette is stable. Again noted is an SVC stent. There is vascular congestion without overt pulmonary edema. There is a new dense airspace opacity at the left lung base concerning for pneumonia or aspiration. There is no pneumothorax. No significant pleural effusion. No acute osseous abnormality. IMPRESSION: Findings concerning for developing left lower lobe pneumonia in the appropriate clinical setting. Electronically Signed  By: Constance Holster M.D.   On: 05/09/2019 00:13   DG Abd Portable 1V  Result Date: 05/10/2019 CLINICAL DATA:  Abdominal pain. EXAM: PORTABLE ABDOMEN - 1 VIEW COMPARISON:  Plain films of the abdomen 06/17/2015. FINDINGS: The bowel gas pattern is normal. No radio-opaque calculi or other significant radiographic abnormality are seen. Multiple surgical clips in the pelvis and upper abdomen are unchanged. IMPRESSION: No acute finding. Electronically Signed   By: Inge Rise M.D.   On: 05/10/2019 13:55   VAS [name] LOWER EXTREMITY VENOUS (DVT)  Result Date: 05/16/2019  Lower Venous DVTStudy Indications: Elevated ddimer, covid.  Comparison Study: 08/25/18 previous Performing Technologist: Abram Sander RVS  Examination Guidelines: A complete evaluation includes B-mode imaging, spectral Doppler, color Doppler, and power Doppler as needed of all accessible portions of each vessel. Bilateral testing is considered an integral part of a complete examination. Limited examinations for reoccurring indications may be performed as noted. The reflux portion of the exam is performed with the patient in reverse Trendelenburg.  +---------+---------------+---------+-----------+----------+--------------+ RIGHT     CompressibilityPhasicitySpontaneityPropertiesThrombus Aging +---------+---------------+---------+-----------+----------+--------------+ CFV      Full           Yes      Yes                                 +---------+---------------+---------+-----------+----------+--------------+ SFJ      Full                                                        +---------+---------------+---------+-----------+----------+--------------+ FV Prox  Full                                                        +---------+---------------+---------+-----------+----------+--------------+ FV Mid   Full                                                        +---------+---------------+---------+-----------+----------+--------------+ FV DistalFull                                                        +---------+---------------+---------+-----------+----------+--------------+ PFV      Full                                                        +---------+---------------+---------+-----------+----------+--------------+ POP      Full           Yes      Yes                                 +---------+---------------+---------+-----------+----------+--------------+  PTV      Full                                                        +---------+---------------+---------+-----------+----------+--------------+ PERO                                                  Not visualized +---------+---------------+---------+-----------+----------+--------------+   +---------+---------------+---------+-----------+----------+--------------+ LEFT     CompressibilityPhasicitySpontaneityPropertiesThrombus Aging +---------+---------------+---------+-----------+----------+--------------+ CFV      Full           Yes      Yes                                 +---------+---------------+---------+-----------+----------+--------------+ SFJ      Full                                                         +---------+---------------+---------+-----------+----------+--------------+ FV Prox  Full                                                        +---------+---------------+---------+-----------+----------+--------------+ FV Mid   Full                                                        +---------+---------------+---------+-----------+----------+--------------+ FV DistalFull                                                        +---------+---------------+---------+-----------+----------+--------------+ PFV      Full                                                        +---------+---------------+---------+-----------+----------+--------------+ POP      Full           Yes      Yes                                 +---------+---------------+---------+-----------+----------+--------------+ PTV      Full                                                        +---------+---------------+---------+-----------+----------+--------------+  PERO     Full                                                        +---------+---------------+---------+-----------+----------+--------------+     Summary: BILATERAL: - No evidence of deep vein thrombosis seen in the lower extremities, bilaterally.   *See table(s) above for measurements and observations.    Preliminary       Phillips Climes M.D on 05/16/2019 at 1:24 PM  Between 7am to 7pm - Pager - [phone]  After 7pm go to [email or web address] - password [facility]  [facility] -  Office  [phone]

## 2019-05-16 NOTE — Progress Notes (Signed)
Lower extremity venous has been completed.   Preliminary results in CV Proc.   Abram Sander 05/16/2019 12:01 PM

## 2019-05-17 LAB — BASIC METABOLIC PANEL
Anion gap: 13 (ref 5–15)
BUN: 78 mg/dL — ABNORMAL HIGH (ref 6–20)
CO2: 24 mmol/L (ref 22–32)
Calcium: 8.9 mg/dL (ref 8.9–10.3)
Chloride: 104 mmol/L (ref 98–111)
Creatinine, Ser: 3.53 mg/dL — ABNORMAL HIGH (ref 0.44–1.00)
GFR calc Af Amer: 16 mL/min — ABNORMAL LOW (ref >60)
GFR calc non Af Amer: 14 mL/min — ABNORMAL LOW (ref >60)
Glucose, Bld: 102 mg/dL — ABNORMAL HIGH (ref 70–99)
Potassium: 3.7 mmol/L (ref 3.5–5.1)
Sodium: 141 mmol/L (ref 135–145)

## 2019-05-17 LAB — CBC
HCT: 26.3 % — ABNORMAL LOW (ref 36.0–46.0)
Hemoglobin: 8.4 g/dL — ABNORMAL LOW (ref 12.0–15.0)
MCH: 32.6 pg (ref 26.0–34.0)
MCHC: 31.9 g/dL (ref 30.0–36.0)
MCV: 101.9 fL — ABNORMAL HIGH (ref 80.0–100.0)
Platelets: 156 10*3/uL (ref 150–400)
RBC: 2.58 MIL/uL — ABNORMAL LOW (ref 3.87–5.11)
RDW: 21.5 % — ABNORMAL HIGH (ref 11.5–15.5)
WBC: 3.4 10*3/uL — ABNORMAL LOW (ref 4.0–10.5)
nRBC: 5.3 % — ABNORMAL HIGH (ref 0.0–0.2)

## 2019-05-17 LAB — HEPARIN INDUCED PLATELET AB (HIT ANTIBODY): Heparin Induced Plt Ab: 0.058 OD (ref 0.000–0.400)

## 2019-05-17 LAB — PATHOLOGIST SMEAR REVIEW

## 2019-05-17 LAB — PROCALCITONIN: Procalcitonin: 0.49 ng/mL

## 2019-05-17 LAB — BRAIN NATRIURETIC PEPTIDE: B Natriuretic Peptide: 506.4 pg/mL — ABNORMAL HIGH (ref 0.0–100.0)

## 2019-05-17 MED ORDER — METOCLOPRAMIDE HCL 5 MG/ML IJ SOLN
5.0000 mg | Freq: Three times a day (TID) | INTRAMUSCULAR | Status: DC
  Administered 2019-05-17 – 2019-05-18 (×4): 5 mg via INTRAVENOUS
  Filled 2019-05-17 (×3): qty 2

## 2019-05-17 MED ORDER — SODIUM BICARBONATE 650 MG PO TABS
650.0000 mg | ORAL_TABLET | Freq: Two times a day (BID) | ORAL | Status: DC
  Administered 2019-05-17 – 2019-05-18 (×2): 650 mg via ORAL
  Filled 2019-05-17 (×2): qty 1

## 2019-05-17 MED ORDER — FUROSEMIDE 10 MG/ML IJ SOLN: 80.0000 mg | Freq: Two times a day (BID) | INTRAMUSCULAR | Status: DC

## 2019-05-17 MED ORDER — PREDNISONE 10 MG PO TABS
10.0000 mg | ORAL_TABLET | Freq: Every day | ORAL | Status: DC
  Administered 2019-05-17 – 2019-05-18 (×2): 10 mg via ORAL
  Filled 2019-05-17 (×2): qty 1

## 2019-05-17 MED ORDER — FUROSEMIDE 10 MG/ML IJ SOLN
80.0000 mg | Freq: Every day | INTRAMUSCULAR | Status: DC
  Administered 2019-05-17: 80 mg via INTRAVENOUS
  Filled 2019-05-17: qty 8

## 2019-05-17 NOTE — Progress Notes (Signed)
Pt assessed for possible PIV access site. Left arm restricted. Infiltration site present on RUA. No appropriate veins present for PIV access at this time due to position of infiltration and depth of veins. Notified RN and recommended discussion with MD regarding CVC placement for continued IV access needs.

## 2019-05-17 NOTE — Progress Notes (Signed)
Spoke with patient-her left arm AV graft is no longer working for the past 30 years.  Per patient-she has had IV lines in the left upper extremity in the past.  Spoke with nephrologist-Dr. Patel-left arm is not restricted-okay to place peripheral IV line.

## 2019-05-17 NOTE — Progress Notes (Addendum)
Patient ID: Tina Watkins, female   DOB: Aug 21, 1965, 54 y.o.   MRN: [id number] [facility] Progress Note   Assessment/ Plan:   1. Acute kidney Injury on chronic kidney disease stage IV T (baseline creatinine ~3.8): With acute kidney injury likely associated with COVID-19 infection/SIRS.  She has continued to have improvement of her renal function and is back at her baseline-with some volume overload for which we will treat her with a single dose of furosemide intravenously and attempt to transition her back to oral outpatient diuretics starting tomorrow.  Without acute electrolyte abnormality or emerging uremic symptoms or signs.  Unfortunately, repeat Prograf level again not a 12-hour trough. 2.  COVID-19 pneumonia: Completed intravenous remdesivir and now on intravenous corticosteroids along with CAP coverage with Rocephin/azithromycin.  Anticipate volume unloading will improve respiratory status further. 3.  Hypotension: Secondary to volume depletion/infection and improved with fluids/midodrine. 4.  Anemia: Chronic and associated with chronic kidney disease/inflammatory component of COVID-19 infection.  She has adequate iron stores and status post Aranesp.  Thrombocytopenia from yesterday possibly erroneous. 5.  Metabolic acidosis: Bicarbonate levels better with ongoing supplementation, will decrease dosing to limit sodium load.  Subjective:   Complains of some tightness in her chest/mild shortness of breath and feeling that she is "swollen".   Objective:   BP 106/62 (BP Location: Right Arm)   Pulse 70   Temp 98.2 F (36.8 C) (Oral)   Resp 18   Ht 4\' 11"  (1.499 m)   Wt 59 kg   SpO2 92%   BMI 26.27 kg/m   Intake/Output Summary (Last 24 hours) at 05/17/2019 1214 Last data filed at 05/17/2019 1100 Gross per 24 hour  Intake 940 ml  Output 901 ml  Net 39 ml   Weight change:   Physical Exam: Gen: Resting comfortably in bed.  Right eye opaque iris/lens. CVS:  Pulse regular rhythm, normal rate, S1 and S2 normal Resp: Clear to auscultation bilaterally, no distinct rales or rhonchi Abd: Soft, obese, nontender, bowel sounds normal Ext: Trace-1+ left lower extremity edema, trace right lower extremity edema.  Thrombosed right upper extremity graft.  Imaging: DG CHEST PORT 1 VIEW  Result Date: 05/16/2019 CLINICAL DATA:  History of COVID-19 positivity EXAM: PORTABLE CHEST 1 VIEW COMPARISON:  05/08/2019 FINDINGS: Cardiac shadow is stable. SVC stent is again seen. Lungs are well aerated bilaterally. Small pleural effusions right greater than left are noted. Mild bibasilar atelectatic changes are seen as well. Postsurgical changes in the cervical spine are noted. IMPRESSION: New small pleural effusions right greater than left with associated atelectatic changes. Electronically Signed   By: Inez Catalina M.D.   On: 05/16/2019 22:58   VAS [name] LOWER EXTREMITY VENOUS (DVT)  Result Date: 05/16/2019  Lower Venous DVTStudy Indications: Elevated ddimer, covid.  Comparison Study: 08/25/18 previous Performing Technologist: Abram Sander RVS  Examination Guidelines: A complete evaluation includes B-mode imaging, spectral Doppler, color Doppler, and power Doppler as needed of all accessible portions of each vessel. Bilateral testing is considered an integral part of a complete examination. Limited examinations for reoccurring indications may be performed as noted. The reflux portion of the exam is performed with the patient in reverse Trendelenburg.  +---------+---------------+---------+-----------+----------+--------------+ RIGHT    CompressibilityPhasicitySpontaneityPropertiesThrombus Aging +---------+---------------+---------+-----------+----------+--------------+ CFV      Full           Yes      Yes                                 +---------+---------------+---------+-----------+----------+--------------+  SFJ      Full                                                         +---------+---------------+---------+-----------+----------+--------------+ FV Prox  Full                                                        +---------+---------------+---------+-----------+----------+--------------+ FV Mid   Full                                                        +---------+---------------+---------+-----------+----------+--------------+ FV DistalFull                                                        +---------+---------------+---------+-----------+----------+--------------+ PFV      Full                                                        +---------+---------------+---------+-----------+----------+--------------+ POP      Full           Yes      Yes                                 +---------+---------------+---------+-----------+----------+--------------+ PTV      Full                                                        +---------+---------------+---------+-----------+----------+--------------+ PERO                                                  Not visualized +---------+---------------+---------+-----------+----------+--------------+   +---------+---------------+---------+-----------+----------+--------------+ LEFT     CompressibilityPhasicitySpontaneityPropertiesThrombus Aging +---------+---------------+---------+-----------+----------+--------------+ CFV      Full           Yes      Yes                                 +---------+---------------+---------+-----------+----------+--------------+ SFJ      Full                                                        +---------+---------------+---------+-----------+----------+--------------+  FV Prox  Full                                                        +---------+---------------+---------+-----------+----------+--------------+ FV Mid   Full                                                         +---------+---------------+---------+-----------+----------+--------------+ FV DistalFull                                                        +---------+---------------+---------+-----------+----------+--------------+ PFV      Full                                                        +---------+---------------+---------+-----------+----------+--------------+ POP      Full           Yes      Yes                                 +---------+---------------+---------+-----------+----------+--------------+ PTV      Full                                                        +---------+---------------+---------+-----------+----------+--------------+ PERO     Full                                                        +---------+---------------+---------+-----------+----------+--------------+     Summary: BILATERAL: - No evidence of deep vein thrombosis seen in the lower extremities, bilaterally.   *See table(s) above for measurements and observations. Electronically signed by Deitra Mayo MD on 05/16/2019 at 3:16:14 PM.    Final     Labs: BMET Recent Labs  Lab 05/11/19 0308 05/12/19 5625 05/13/19 2039 05/14/19 0258 05/15/19 0327 05/16/19 0310 05/17/19 0235  NA 139 141 142 143 143 141 141  K 3.9 4.2 4.2 3.8 5.2* 4.3 3.7  CL 102 101 103 103 107 103 104  CO2 21* 20* 21* 20* 17* 24 24  GLUCOSE 122* 98 119* 89 98 117* 102*  BUN 91* 94* 110* 108* 111* 92* 78*  CREATININE 5.74* 5.62* 6.51* 6.25* 5.26* 3.97* 3.53*  CALCIUM 8.7* 9.0 9.5 9.5 9.2 9.0 8.9   CBC Recent Labs  Lab 05/14/19 0258 05/15/19 0647 05/16/19 0310 05/17/19 0235  WBC 3.0* 2.9* 2.2* 3.4*  HGB 9.6* 8.8* 7.6* 8.4*  HCT 30.0* 27.6* 24.2* 26.3*  MCV 100.7*  102.2* 102.1* 101.9*  PLT 163 157 81* 156    Medications:    . vitamin C  500 mg Oral Daily  . aspirin EC  81 mg Oral Daily  . azaTHIOprine  100 mg Oral Daily  . darbepoetin (ARANESP) injection - NON-DIALYSIS  100 mcg Subcutaneous  Q Wed-1800  . folic acid  1 mg Oral Daily  . furosemide  80 mg Intravenous Daily  . gabapentin  100 mg Oral BID  . hydrocortisone cream   Topical BID  . latanoprost  1 drop Left Eye QHS  . metoCLOPramide (REGLAN) injection  5 mg Intravenous Q8H  . midodrine  5 mg Oral BID WC  .  morphine injection  2 mg Intravenous Once  . nystatin  5 mL Oral QID  . pantoprazole  40 mg Oral Q1200  . predniSONE  10 mg Oral Daily  . sodium bicarbonate  650 mg Oral TID  . tacrolimus  2 mg Oral Q24H  . tacrolimus  3 mg Oral q AM  . zinc sulfate  220 mg Oral Daily   Elmarie Shiley, MD 05/17/2019, 12:14 PM

## 2019-05-17 NOTE — Progress Notes (Signed)
Physical Therapy Treatment Patient Details Name: Tina Watkins MRN: [id number] DOB: 1965/07/20 Today's Date: 05/17/2019    History of Present Illness Patient reports feeling poorly for 3 weeks with progressive dyspnea. CXR: LLL PNA. Patient given blood transfusion with good hemoglobin response. +COVID being treated wtih IV steroids and IV Remdesivir. IV Rocephin and Azithromycin to cover for bacterial infection also. Patient with anemia likely of chronic kidney disease as she has a history of ESRD s/p renal transplant. Nephrology following. Patient may be headed toward needing dialysis. PMH: renal transplant x3, CKD, chronic diastolic CHF, chronic anemia, chronic hypotension, liver cirrhosis, HTN, TIA    PT Comments    Patient on [place] for comfort at rest, confirmed with nurse. Oxygen saturation 100% at rest on room air. 90% post ambulation trial in hallway. Patient reports she has been ambulating at night with nursing staff and RW. Patient is supervision for mobility using RW. She declined trialing [facility] as she requested to OT last session to try quad cane. RW is recommended AD at this time. Patient unable to negotiate step despite use of railing and assistance. Once more fluid is off of her anticipate she will be able to negotiate the steps into her apartment with assistance. Patient is receiving Lasix.   Follow Up Recommendations  Home health PT;Supervision - Intermittent     Equipment Recommendations  Rolling walker with 5" wheels       Precautions / Restrictions Precautions Precautions: Fall;Other (comment) Precaution Comments: COVID+ Restrictions Weight Bearing Restrictions: No    Mobility  Bed Mobility  General bed mobility comments: Patient sitting up in chair upon PT arrival.  Transfers Overall transfer level: Needs assistance Equipment used: Rolling walker (2 wheeled) Transfers: Sit to/from Stand Sit to Stand: Supervision         General transfer comment: Cues  for hand placement for stand>sit.  Ambulation/Gait Ambulation/Gait assistance: Supervision Gait Distance (Feet): 80 Feet(with standing rest break halfway through) Assistive device: Rolling walker (2 wheeled) Gait Pattern/deviations: Trunk flexed(increased lateral sway) Gait velocity: decreased   General Gait Details: Patient on room air. Oxygen saturation 90% post ambulation trial.   Stairs Stairs: (attempted but patient unable to place LLE onto step )     Balance Overall balance assessment: Needs assistance         Standing balance support: Bilateral upper extremity supported Standing balance-Leahy Scale: Fair      Cognition Arousal/Alertness: Awake/alert Behavior During Therapy: WFL for tasks assessed/performed Overall Cognitive Status: Within Functional Limits for tasks assessed           General Comments General comments (skin integrity, edema, etc.): Per discussion with nruse, patient on suppl oxygen for comfort due to panic attack last night. O2 sat 100% at start of session on room air. O2 sat 90% post ambulation trial on room air and up to 97% with seated rest break. HR WNL. PT showed patient large based quad cane and patient declined wanting to use it stating, "that's too big." Education on benefits of use of RW and why RW is recommended at this time.      Pertinent Vitals/Pain Pain Assessment: Faces Faces Pain Scale: Hurts a little bit(patient reports as she is diuresed it improves) Pain Location: R posterior hip/back Pain Descriptors / Indicators: Tightness Pain Intervention(s): Limited activity within patient's tolerance;Monitored during session           PT Goals (current goals can now be found in the care plan section) Progress towards PT goals: Progressing toward goals  Frequency    Min 3X/week      PT Plan Current plan remains appropriate       AM-PAC PT "6 Clicks" Mobility   Outcome Measure  Help needed turning from your back to your  side while in a flat bed without using bedrails?: None Help needed moving from lying on your back to sitting on the side of a flat bed without using bedrails?: A Little Help needed moving to and from a bed to a chair (including a wheelchair)?: A Little Help needed standing up from a chair using your arms (e.g., wheelchair or bedside chair)?: None Help needed to walk in hospital room?: A Little Help needed climbing 3-5 steps with a railing? : Total 6 Click Score: 18    End of Session   Activity Tolerance: Patient tolerated treatment well Patient left: in chair;with call bell/phone within reach Nurse Communication: Mobility status;Other (comment)(oxygen saturation response) PT Visit Diagnosis: Unsteadiness on feet (R26.81);Other abnormalities of gait and mobility (R26.89)     Time: 7829-5621 PT Time Calculation (min) (ACUTE ONLY): 23 min  Charges:  $Gait Training: 23-37 mins                     Birdie Hopes, PT, DPT Acute Rehab [phone] office     Birdie Hopes 05/17/2019, 4:12 PM

## 2019-05-17 NOTE — Progress Notes (Addendum)
PROGRESS NOTE                                                                                                                                                                                                             Patient Demographics:    Tina Watkins, is a 54 y.o. female, DOB - October 22, 1965, ZMO:[id number]  Outpatient Primary MD for the patient is [name], FNP   Admit date - 05/08/2019   LOS - 8  Chief Complaint  Patient presents with  . Low Hemoglobin  . Hypotension       Brief Narrative: Patient is a 54 y.o. 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  See below for further details.  Significant Events: 4/5>> admit to [facility]  COVID-19 medications: Steroids: 4/5>> Remdesivir: 4/6>> 4/10  Antibiotics: Rocephin: 4/5>> 4/13 Zithromax: 4/5>> 4/14  Microbiology data: 4/6 >>blood culture: Negative 4/6>> urine culture: 10,000 colonies/ml Diphtheroids  DVT prophylaxis: SCD  Procedures: None  Consults: Renal    Subjective:    Tina Watkins today complains awake discomfort in her chest-lower extremity edema.  Had some mild shortness of breath last night.   Assessment  & Plan :    Covid 19 Viral pneumonia possible concurrent bacterial pneumonia: Improved-not hypoxic-has completed a course of remdesivir and Rocephin/Zithromax.  CRP only minimally elevated.  Fever: afebrile  O2 requirements:  SpO2: 92 % O2 Flow Rate (L/min): 1 L/min   COVID-19 Labs: Recent Labs    05/15/19 0327 05/16/19 0310  CRP 1.6* 1.6*       Component Value Date/Time   BNP 506.4 (H) 05/17/2019 0235    Recent Labs  Lab 05/17/19 0235  PROCALCITON 0.49     Lab Results  Component Value Date   SARSCOV2NAA POSITIVE (A) 05/09/2019   [place] NEGATIVE 03/17/2019    Prone/Incentive Spirometry: encouraged encourage incentive spirometry use 3-4/hour.  Decompensated diastolic heart failure: Volume overloaded on exam-+ 8 L so far-developed shortness of breath overnight-start IV Lasix-reassess volume status tomorrow.  Hypotension: Multifactorial etiology-secondary to volume depletion, relative adrenal insufficiency-stable with low-dose midodrine.  AKI on CKD stage IV-renal transplant: AKI likely hemodynamically mediated.  Renal ultrasound without hydronephrosis of transplanted kidney.  Slowly improving-now volume overloaded-starting diuretics.  Remains on azathioprine/Prograf-restart prednisone with plans to slowly taper back down to usual home regimen over the next few days  Anemia: Secondary to underlying CKD and acute illness from COVID-19.  Thrombocytopenia: Resolved-suspect labs yesterday could have been a error.  HIT work-up pending.  Nausea vomiting/abdominal pain: Resolved-thought to be secondary to gastroparesis.  CT abdomen without any acute abnormalities.  Continue Reglan but decrease dose to 5 mg.  Oral thrush: Improved-continue nystatin   ABG:    Component Value Date/Time   PHART 7.425 03/23/2019 1020   PCO2ART 32.9 03/23/2019 1020   PO2ART 131.0 (H) 03/23/2019 1020   HCO3 22.8 03/23/2019 1034   TCO2 24 03/23/2019 1034   ACIDBASEDEF 2.0 03/23/2019 1034   O2SAT 77.0 03/23/2019 1034    Vent Settings: N/A  Condition - Stable  Family Communication  : Called mother-no response.  4/14  Code Status :  Full Code  Diet :  Diet Order            Diet Heart Room service appropriate? Yes; Fluid consistency: Thin  Diet effective now               Disposition Plan  :  Remain hospitalized  Barriers to discharge: Hypoxia requiring O2 supplementation/IV Lasix  Antimicorbials  :    Anti-infectives (From admission,  onward)   Start     Dose/Rate Route Frequency Ordered Stop   05/12/19 1000  azithromycin (ZITHROMAX) tablet 500 mg     500 mg Oral Daily 05/11/19 0929 05/19/19 0959   05/11/19 0600  cefTRIAXone (ROCEPHIN) 1 g in sodium chloride 0.9 % 100 mL IVPB     1 g 200 mL/hr over 30 Minutes Intravenous Every 24 hours 05/10/19 0639 05/17/19 0621   05/10/19 1000  remdesivir 100 mg in sodium chloride 0.9 % 100 mL IVPB     100 mg 200 mL/hr over 30 Minutes Intravenous Daily 05/09/19 0646 05/13/19 0943   05/10/19 0800  azithromycin (ZITHROMAX) 500 mg in sodium chloride 0.9 % 250 mL IVPB  Status:  Discontinued     500 mg 250 mL/hr over 60 Minutes Intravenous Every 24 hours 05/10/19 0639 05/11/19 0929   05/10/19 0645  cefTRIAXone (ROCEPHIN) 1 g in sodium chloride 0.9 % 100 mL IVPB     1 g 200 mL/hr over 30 Minutes Intravenous  Once 05/10/19 0641 05/10/19 0900   05/09/19 0730  remdesivir 100 mg in sodium chloride 0.9 % 100 mL IVPB     100 mg 200 mL/hr over 30 Minutes Intravenous Every 30 min 05/09/19 0646 05/09/19 1017   05/09/19 0145  azithromycin (ZITHROMAX) 500 mg in sodium chloride 0.9 % 250 mL IVPB     500 mg 250 mL/hr over 60 Minutes Intravenous  Once 05/09/19 0134 05/09/19 0326   05/09/19 0145  cefTRIAXone (ROCEPHIN) 1 g in sodium chloride 0.9 % 100 mL IVPB     1 g 200 mL/hr over 30 Minutes Intravenous  Once 05/09/19 0134 05/09/19 0221      Inpatient Medications  Scheduled Meds: . vitamin C  500 mg Oral Daily  . aspirin EC  81 mg Oral Daily  . azaTHIOprine  100 mg Oral Daily  . azithromycin  500 mg Oral Daily  . darbepoetin (ARANESP) injection - NON-DIALYSIS  100 mcg Subcutaneous Q Wed-1800  . dexamethasone  6 mg Oral Daily  . folic acid  1 mg Oral Daily  . furosemide  80 mg Intravenous BID  . gabapentin  100 mg Oral BID  . hydrocortisone cream   Topical BID  . latanoprost  1 drop Left Eye QHS  . metoCLOPramide (REGLAN) injection  10 mg Intravenous Q8H  . midodrine  5 mg Oral BID WC  .   morphine injection  2 mg Intravenous Once  . nystatin  5 mL Oral QID  . pantoprazole  40 mg Oral Q1200  . sodium bicarbonate  650 mg Oral TID  . tacrolimus  2 mg Oral Q24H  . tacrolimus  3 mg Oral q AM  . zinc sulfate  220 mg Oral Daily   Continuous Infusions: . sodium chloride Stopped (05/09/19 0929)   PRN Meds:.acetaminophen, benzonatate, lidocaine, ondansetron (ZOFRAN) IV, oxyCODONE-acetaminophen **AND** oxyCODONE, promethazine **OR** promethazine **OR** promethazine   Time Spent in minutes  25   See all Orders from today for further details   Oren Binet M.D on 05/17/2019 at 11:47 AM  To page go to [email or web address] - use universal password  [facility] -  Office  [phone]    Objective:   Vitals:   05/16/19 2159 05/17/19 0448 05/17/19 0830 05/17/19 1100  BP:  105/72 106/62   Pulse: 66 64 70   Resp: (!) 28 20 18    Temp:  98.5 F (36.9 C) 98.2 F (36.8 C)   TempSrc:  Oral Oral   SpO2: 97% 96% 92%   Weight:    59 kg  Height:        Wt Readings from Last 3 Encounters:  05/17/19 59 kg  05/08/19 52.3 kg  04/19/19 54.3 kg     Intake/Output Summary (Last 24 hours) at 05/17/2019 1147 Last data filed at 05/17/2019 1100 Gross per 24 hour  Intake 940 ml  Output 901 ml  Net 39 ml     Physical Exam Gen Exam:Alert awake-not in any distress HEENT:atraumatic, normocephalic Chest: B/L rales at bases CVS:S1S2 regular Abdomen:soft non tender, non distended Extremities:++ edema Neurology: Non focal Skin: no rash   Data Review:    CBC Recent Labs  Lab 05/13/19 1833 05/14/19 0258 05/15/19 0647 05/16/19 0310 05/17/19 0235  WBC 3.0* 3.0* 2.9* 2.2* 3.4*  HGB 9.6* 9.6* 8.8* 7.6* 8.4*  HCT 29.9* 30.0* 27.6* 24.2* 26.3*  PLT 156 163 157 81* 156  MCV 100.7* 100.7* 102.2* 102.1* 101.9*  MCH 32.3 32.2 32.6 32.1 32.6  MCHC 32.1 32.0 31.9 31.4 31.9  RDW 21.6* 21.7* 21.6* 21.5* 21.5*    Chemistries  Recent Labs  Lab 05/12/19 3570 05/12/19 1779  05/13/19 2039 05/14/19 0258 05/15/19 0327 05/16/19 0310 05/17/19 0235  NA 141   < > 142 143 143 141 141  K 4.2   < > 4.2 3.8 5.2* 4.3 3.7  CL 101   < > 103 103 107 103 104  CO2 20*   < > 21* 20* 17* 24 24  GLUCOSE 98   < > 119* 89 98 117* 102*  BUN 94*   < > 110* 108* 111* 92* 78*  CREATININE 5.62*   < > 6.51* 6.25* 5.26* 3.97* 3.53*  CALCIUM 9.0   < > 9.5 9.5 9.2 9.0 8.9  AST 29  --  25 22 29  33  --   ALT 21  --  24 18 16 17   --   ALKPHOS 107  --  111 106 93 99  --   BILITOT 0.9  --  1.2 0.9 1.2 1.1  --    < > = values in this interval not displayed.   ------------------------------------------------------------------------------------------------------------------  No results for input(s): CHOL, HDL, LDLCALC, TRIG, CHOLHDL, LDLDIRECT in the last 72 hours.  Lab Results  Component Value Date   HGBA1C 4.8 05/08/2019   ------------------------------------------------------------------------------------------------------------------ No results for input(s): TSH, T4TOTAL, T3FREE, THYROIDAB in the last 72 hours.  Invalid input(s): FREET3 ------------------------------------------------------------------------------------------------------------------ No results for input(s): VITAMINB12, FOLATE, FERRITIN, TIBC, IRON, RETICCTPCT in the last 72 hours.  Coagulation profile No results for input(s): INR, PROTIME in the last 168 hours.  No results for input(s): DDIMER in the last 72 hours.  Cardiac Enzymes No results for input(s): CKMB, TROPONINI, MYOGLOBIN in the last 168 hours.  Invalid input(s): CK ------------------------------------------------------------------------------------------------------------------    Component Value Date/Time   BNP 506.4 (H) 05/17/2019 0235    Micro Results Recent Results (from the past 240 hour(s))  Blood culture (routine x 2)     Status: None   Collection Time: 05/09/19 12:21 AM   Specimen: BLOOD RIGHT HAND  Result Value Ref Range Status    Specimen Description BLOOD RIGHT HAND  Final   Special Requests   Final    BOTTLES DRAWN AEROBIC AND ANAEROBIC Blood Culture results may not be optimal due to an inadequate volume of blood received in culture bottles   Culture   Final    NO GROWTH 5 DAYS Performed at [facility], [place] [address], [place], [place] [address]    Report Status 05/14/2019 FINAL  Final  SARS CORONAVIRUS 2 (TAT 6-24 HRS) Nasopharyngeal Nasopharyngeal Swab     Status: Abnormal   Collection Time: 05/09/19 12:25 AM   Specimen: Nasopharyngeal Swab  Result Value Ref Range Status   SARS Coronavirus 2 POSITIVE (A) NEGATIVE Final    Comment: RESULT CALLED TO, READ BACK BY AND VERIFIED WITH: RN OSORIO BAILEY AT 0620 ON 05/09/2019 BY MESSAN HOUEGNIFIO (NOTE) SARS-CoV-2 target nucleic acids are DETECTED. The SARS-CoV-2 RNA is generally detectable in upper and lower respiratory specimens during the acute phase of infection. Positive results are indicative of the presence of SARS-CoV-2 RNA. Clinical correlation with patient history and other diagnostic information is  necessary to determine patient infection status. Positive results do not rule out bacterial infection or co-infection with other viruses.  The expected result is Negative. Fact Sheet for Patients: SugarRoll.be Fact Sheet for Healthcare Providers: https://www.woods-mathews.com/ This test is not yet approved or cleared by the [place] FDA and  has been authorized for detection and/or diagnosis of SARS-CoV-2 by FDA under an Emergency Use Authorization (EUA). This EUA will remain  in effect (meaning this tes t can be used) for the duration of the COVID-19 declaration under Section 564(b)(1) of the Act, 21 U.S.C. section 360bbb-3(b)(1), unless the authorization is terminated or revoked sooner. Performed at [facility], [place] [address], [place], [place] [address]   Gram stain     Status: None    Collection Time: 05/09/19  5:26 AM   Specimen: Urine, Random  Result Value Ref Range Status   Specimen Description URINE, RANDOM  Final   Special Requests Immunocompromised  Final   Gram Stain   Final    WBC PRESENT,BOTH PMN AND MONONUCLEAR GRAM POSITIVE COCCI IN PAIRS GRAM VARIABLE ROD CYTOSPIN SMEAR Performed at [facility], [address] [place], [place] [address]    Report Status 05/09/2019 FINAL  Final  Urine culture     Status: Abnormal   Collection Time: 05/09/19  5:26 AM   Specimen: Urine, Random  Result Value Ref Range Status   Specimen Description URINE, RANDOM  Final   Special Requests Immunocompromised  Final   Culture (A)  Final    10,000 COLONIES/mL DIPHTHEROIDS(CORYNEBACTERIUM SPECIES) Standardized susceptibility testing for this organism is not available. Performed at [facility], [place] [address] [place], [place] [address]    Report Status 05/10/2019 FINAL  Final  Blood culture (routine x 2)     Status: None   Collection Time: 05/09/19  6:18 AM   Specimen: BLOOD RIGHT HAND  Result Value Ref Range Status   Specimen Description BLOOD RIGHT HAND  Final   Special Requests   Final    BOTTLES DRAWN AEROBIC AND ANAEROBIC Blood Culture results may not be optimal due to an inadequate volume of blood received in culture bottles   Culture   Final    NO GROWTH 5 DAYS Performed at [facility], [place] [address] [place], [place] [address]    Report Status 05/14/2019 FINAL  Final    Radiology Reports CT ABDOMEN PELVIS WO CONTRAST  Result Date: 05/12/2019 CLINICAL DATA:  Abdominal pain and vomiting, COVID-19 positivity EXAM: CT ABDOMEN AND PELVIS WITHOUT CONTRAST TECHNIQUE: Multidetector CT imaging of the abdomen and pelvis was performed following the standard protocol without IV contrast. COMPARISON:  08/09/2017, 08/25/2018 FINDINGS: Lower chest: Bilateral pleural effusions are noted right greater than left with associated compressive atelectatic  changes. Hepatobiliary: No focal liver abnormality is seen. No gallstones, gallbladder wall thickening, or biliary dilatation. Pancreas: Unremarkable. No pancreatic ductal dilatation or surrounding inflammatory changes. Spleen: Splenic granulomas are noted. Adrenals/Urinary Tract: Adrenal glands appear within normal limits. The native kidneys have been surgically removed. A transplant kidney is noted in the right pelvis. No obstructive changes are seen. 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 Stomach/Bowel: No obstructive or inflammatory changes of the bowel are noted. Vascular/Lymphatic: Vascular calcifications are noted. Some stable retroperitoneal lymph nodes are seen. Given their long-term stability there felt to be benign in etiology. Prominence of the azygos and hemi azygous system in the retrocrural space is noted. Reproductive: Uterus and bilateral adnexa are unremarkable. Other: Changes of diffuse anasarca.  Minimal ascites is seen. Musculoskeletal: No acute bony abnormality is noted. 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 IMPRESSION: Minimal ascites and mild changes of anasarca. Bilateral pleural effusions right greater than left with associated basilar atelectasis. 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 No acute abnormality is noted within the abdomen. Electronically Signed   By: Inez Catalina M.D.   On: 05/12/2019 14:51   DG Chest 2 View  Result Date: 04/17/2019 CLINICAL DATA:  Dyspnea and weakness. EXAM: CHEST - 2 VIEW COMPARISON:  March 20, 2019. FINDINGS: There are stable mild cardiomegaly and superior vena cava stent graft placement. Increased interstitial opacity projects on the bilateral infrahilar regions with mild bronchial wall thickening, but without obscuration of the diaphragm or heart borders. There is persistent interstitial edema. There is no pneumothorax or obvious pleural effusion.  Aortic knob calcified atherosclerosis and postsurgical change of lower cervical ACDF are present. Surgical clips project on the upper abdomen. IMPRESSION: Increased bilateral infrahilar bronchitis with nonspecific small airways disease which may be inflammatory, infectious or aspiration-related. No pneumonia. Stable cardiomegaly and postoperative changes with subtle pulmonary edema but no obvious pleural effusion. Electronically Signed   By: Revonda Humphrey   On: 04/17/2019 15:09   US Renal Transplant w/Doppler  Result Date: 05/10/2019 CLINICAL DATA:  Acute kidney injury EXAM: ULTRASOUND OF RENAL TRANSPLANT WITH  RENAL DOPPLER ULTRASOUND TECHNIQUE: Ultrasound examination of the renal transplant was performed with gray-scale, color and duplex doppler evaluation. COMPARISON:  August 12, 2017 FINDINGS: Transplant kidney location: RLQ Transplant Kidney: Renal measurements: 10.8 x 5.6 x 5 cm = volume: There is 190mL. Normal in size and parenchymal echogenicity. No evidence of mass or hydronephrosis. No peri-transplant fluid collection seen. Color flow in the main renal artery:  Yes Color flow in the main renal vein:  Yes Duplex Doppler Evaluation: Main Renal Artery Resistive Index: 0.82 Venous waveform in main renal vein:  Present Intrarenal resistive index in upper pole:  0.56 (normal 0.6-0.8; equivocal 0.8-0.9; abnormal >= 0.9) Intrarenal resistive index in lower pole: 0.55 (normal 0.6-0.8; equivocal 0.8-0.9; abnormal >= 0.9) Bladder: The bladder is decompressed which limits evaluation. Other findings:  None. IMPRESSION: 1. Renal transplant in the right lower quadrant without evidence for hydronephrosis. 2. Patent main renal artery and vein. 3. Relatively normal resistive indices without significant change from study in 2019. Electronically Signed   By: Constance Holster M.D.   On: 05/10/2019 18:51   US RENAL  Result Date: 05/09/2019 CLINICAL DATA:  AKI, renal transplant EXAM: RENAL / URINARY TRACT ULTRASOUND  COMPLETE COMPARISON:  None. FINDINGS: Kidneys: Native kidneys are absent surgically. Right lower quadrant transplant kidney measures 10.7 x 4.9 x 4.8 cm = volume 131.5 mL. No hydronephrosis. Bladder: Appears normal for degree of bladder distention. Other: None. IMPRESSION: Bilateral nephrectomy with right lower quadrant transplant. No hydronephrosis. Electronically Signed   By: Macy Mis M.D.   On: 05/09/2019 15:10   DG CHEST PORT 1 VIEW  Result Date: 05/16/2019 CLINICAL DATA:  History of COVID-19 positivity EXAM: PORTABLE CHEST 1 VIEW COMPARISON:  05/08/2019 FINDINGS: Cardiac shadow is stable. SVC stent is again seen. Lungs are well aerated bilaterally. Small pleural effusions right greater than left are noted. Mild bibasilar atelectatic changes are seen as well. Postsurgical changes in the cervical spine are noted. IMPRESSION: New small pleural effusions right greater than left with associated atelectatic changes. Electronically Signed   By: Inez Catalina M.D.   On: 05/16/2019 22:58   DG Chest Portable 1 View  Result Date: 05/09/2019 CLINICAL DATA:  Hypotension. EXAM: PORTABLE CHEST 1 VIEW COMPARISON:  05/18/2019 FINDINGS: Cardiac silhouette is stable. Again noted is an SVC stent. There is vascular congestion without overt pulmonary edema. There is a new dense airspace opacity at the left lung base concerning for pneumonia or aspiration. There is no pneumothorax. No significant pleural effusion. No acute osseous abnormality. IMPRESSION: Findings concerning for developing left lower lobe pneumonia in the appropriate clinical setting. Electronically Signed   By: Constance Holster M.D.   On: 05/09/2019 00:13   DG Abd Portable 1V  Result Date: 05/10/2019 CLINICAL DATA:  Abdominal pain. EXAM: PORTABLE ABDOMEN - 1 VIEW COMPARISON:  Plain films of the abdomen 06/17/2015. FINDINGS: The bowel gas pattern is normal. No radio-opaque calculi or other significant radiographic abnormality are seen. Multiple  surgical clips in the pelvis and upper abdomen are unchanged. IMPRESSION: No acute finding. Electronically Signed   By: Inge Rise M.D.   On: 05/10/2019 13:55   VAS [name] LOWER EXTREMITY VENOUS (DVT)  Result Date: 05/16/2019  Lower Venous DVTStudy Indications: Elevated ddimer, covid.  Comparison Study: 08/25/18 previous Performing Technologist: Abram Sander RVS  Examination Guidelines: A complete evaluation includes B-mode imaging, spectral Doppler, color Doppler, and power Doppler as needed of all accessible portions of each vessel. Bilateral testing is considered an integral part of a complete  examination. Limited examinations for reoccurring indications may be performed as noted. The reflux portion of the exam is performed with the patient in reverse Trendelenburg.  +---------+---------------+---------+-----------+----------+--------------+ RIGHT    CompressibilityPhasicitySpontaneityPropertiesThrombus Aging +---------+---------------+---------+-----------+----------+--------------+ CFV      Full           Yes      Yes                                 +---------+---------------+---------+-----------+----------+--------------+ SFJ      Full                                                        +---------+---------------+---------+-----------+----------+--------------+ FV Prox  Full                                                        +---------+---------------+---------+-----------+----------+--------------+ FV Mid   Full                                                        +---------+---------------+---------+-----------+----------+--------------+ FV DistalFull                                                        +---------+---------------+---------+-----------+----------+--------------+ PFV      Full                                                        +---------+---------------+---------+-----------+----------+--------------+ POP      Full            Yes      Yes                                 +---------+---------------+---------+-----------+----------+--------------+ PTV      Full                                                        +---------+---------------+---------+-----------+----------+--------------+ PERO                                                  Not visualized +---------+---------------+---------+-----------+----------+--------------+   +---------+---------------+---------+-----------+----------+--------------+ LEFT     CompressibilityPhasicitySpontaneityPropertiesThrombus Aging +---------+---------------+---------+-----------+----------+--------------+ CFV      Full           Yes  Yes                                 +---------+---------------+---------+-----------+----------+--------------+ SFJ      Full                                                        +---------+---------------+---------+-----------+----------+--------------+ FV Prox  Full                                                        +---------+---------------+---------+-----------+----------+--------------+ FV Mid   Full                                                        +---------+---------------+---------+-----------+----------+--------------+ FV DistalFull                                                        +---------+---------------+---------+-----------+----------+--------------+ PFV      Full                                                        +---------+---------------+---------+-----------+----------+--------------+ POP      Full           Yes      Yes                                 +---------+---------------+---------+-----------+----------+--------------+ PTV      Full                                                        +---------+---------------+---------+-----------+----------+--------------+ PERO     Full                                                         +---------+---------------+---------+-----------+----------+--------------+     Summary: BILATERAL: - No evidence of deep vein thrombosis seen in the lower extremities, bilaterally.   *See table(s) above for measurements and observations. Electronically signed by Deitra Mayo MD on 05/16/2019 at 3:16:14 PM.    Final

## 2019-05-18 LAB — CBC
HCT: 25.5 % — ABNORMAL LOW (ref 36.0–46.0)
Hemoglobin: 8.1 g/dL — ABNORMAL LOW (ref 12.0–15.0)
MCH: 32 pg (ref 26.0–34.0)
MCHC: 31.8 g/dL (ref 30.0–36.0)
MCV: 100.8 fL — ABNORMAL HIGH (ref 80.0–100.0)
Platelets: 149 10*3/uL — ABNORMAL LOW (ref 150–400)
RBC: 2.53 MIL/uL — ABNORMAL LOW (ref 3.87–5.11)
RDW: 21.5 % — ABNORMAL HIGH (ref 11.5–15.5)
WBC: 3.2 10*3/uL — ABNORMAL LOW (ref 4.0–10.5)
nRBC: 4.4 % — ABNORMAL HIGH (ref 0.0–0.2)

## 2019-05-18 LAB — RENAL FUNCTION PANEL
Albumin: 3 g/dL — ABNORMAL LOW (ref 3.5–5.0)
Anion gap: 11 (ref 5–15)
BUN: 66 mg/dL — ABNORMAL HIGH (ref 6–20)
CO2: 27 mmol/L (ref 22–32)
Calcium: 8.8 mg/dL — ABNORMAL LOW (ref 8.9–10.3)
Chloride: 103 mmol/L (ref 98–111)
Creatinine, Ser: 3.11 mg/dL — ABNORMAL HIGH (ref 0.44–1.00)
GFR calc Af Amer: 19 mL/min — ABNORMAL LOW (ref >60)
GFR calc non Af Amer: 17 mL/min — ABNORMAL LOW (ref >60)
Glucose, Bld: 113 mg/dL — ABNORMAL HIGH (ref 70–99)
Phosphorus: 2.7 mg/dL (ref 2.5–4.6)
Potassium: 3.8 mmol/L (ref 3.5–5.1)
Sodium: 141 mmol/L (ref 135–145)

## 2019-05-18 MED ORDER — MIDODRINE HCL 5 MG PO TABS: 5.0000 mg | ORAL_TABLET | Freq: Two times a day (BID) | ORAL | 0 refills | Status: DC

## 2019-05-18 MED ORDER — SODIUM BICARBONATE 650 MG PO TABS: 650.0000 mg | ORAL_TABLET | Freq: Two times a day (BID) | ORAL | 0 refills | Status: DC

## 2019-05-18 MED ORDER — BENZONATATE 100 MG PO CAPS: 100.0000 mg | ORAL_CAPSULE | Freq: Three times a day (TID) | ORAL | 0 refills | Status: DC | PRN

## 2019-05-18 MED FILL — MIDODRINE HCL 5 MG TABS: 5 | 30 days supply | Qty: 60 | Fill #0

## 2019-05-18 MED FILL — BENZONATATE 100 MG CAP: 100 | 7 days supply | Qty: 20 | Fill #0

## 2019-05-18 MED FILL — SODIUM BICARBONATE 650 MG T: 650 | 30 days supply | Qty: 60 | Fill #0

## 2019-05-18 NOTE — Discharge Summary (Addendum)
PATIENT DETAILS Name: Tina Watkins Age: 54 y.o. Sex: female Date of Birth: 03/05/1965 MRN: [id number]. Admitting Physician: Domenic Polite, MD CHE:NIDPOE, Ellie Lunch, FNP  Admit Date: 05/08/2019 Discharge date: 05/18/2019  Recommendations for Outpatient Follow-up:  1. Follow up with PCP in 1-2 weeks 2. Please obtain CMP/CBC in one week 3. Repeat Chest Xray in 4-6 week 4. Please ensure follow-up with nephrology. 5. Has cirrhosis by imaging-please consider further work-up in the outpatient setting.  Admitted From:  Home  Disposition: Home with home health services   Babb:  Yes  Equipment/Devices: None  Discharge Condition: Stable  CODE STATUS: FULL CODE  Diet recommendation:  Diet Order            Diet - low sodium heart healthy        Diet Heart Room service appropriate? Yes; Fluid consistency: Thin  Diet effective now               Brief Narrative: Patient is a 54 y.o. 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  See below for further details.  Significant Events: 4/5>> admit to [facility]  COVID-19 medications: Steroids: 4/5>> Remdesivir: 4/6>> 4/10  Antibiotics: Rocephin: 4/5>> 4/13 Zithromax: 4/5>> 4/14  Microbiology data: 4/6 >>blood culture: Negative 4/6>> urine culture: 10,000 colonies/ml Diphtheroids  Consults: Renal  Brief Hospital Course: Covid 19 Viral pneumonia possible concurrent bacterial pneumonia: Improved-not hypoxic-has completed a course of remdesivir and Rocephin/Zithromax.  CRP only minimally elevated.  Was on Decadron-but has been transitioned back to usual dosing of prednisone on discharge.  COVID-19 Labs:  Recent Labs    05/16/19 0310  CRP 1.6*     Lab Results  Component Value Date   SARSCOV2NAA POSITIVE (A) 05/09/2019   [place] NEGATIVE 03/17/2019    Decompensated diastolic heart failure: Developed volume overload during this hospital stay-rapidly improved after IV Lasix-volume status much better-only trace edema on exam today-after discussion with nephrology-resume Lasix-continue to hold metolazone.  Hypotension: Multifactorial etiology-secondary to volume depletion, relative adrenal insufficiency-stable with low-dose midodrine.  AKI on CKD stage IV-renal transplant: AKI likely hemodynamically mediated.  Renal ultrasound without hydronephrosis of transplanted kidney.  Slowly improving-now volume overloaded-starting diuretics.  Remains on azathioprine/Prograf-restart prednisone with plans to slowly taper back down to usual home regimen over the next few days  Anemia: Secondary to underlying CKD and acute illness from COVID-19.  Thrombocytopenia: Resolved-suspect it was a lab error-subsequent platelet counts are within normal limits.  HIT antibody was negative.   Nausea vomiting/abdominal pain: Resolved-thought to be secondary to gastroparesis.  CT abdomen without any acute abnormalities.  Continue Reglan but decrease dose to 5 mg.  Oral thrush: Improved-continue nystatin   ??  Liver cirrhosis: Seen on imaging studies-stable for outpatient work-up at the discretion of her primary care practitioner.   Note-left voicemail for mother on the day of discharge.  Discharge Diagnoses:  Principal Problem:   Pneumonia due to COVID-19 virus Active Problems:   AKI (acute kidney injury) ([place])   Hypotension   Anemia   CHF (congestive heart failure) [facility])   Discharge Instructions:    Person Under Monitoring Name: Viki Carrera  Location: [address] [place] [address]   Infection Prevention Recommendations for Individuals Confirmed to have, or Being Evaluated for, 2019 Novel Coronavirus  (COVID-19) Infection Who Receive Care at  Home  Individuals who are confirmed to have, or are being evaluated for, COVID-19 should follow the prevention steps below until a healthcare provider or local or state health department says they can return to normal activities.  Stay home except to get medical care You should restrict activities outside your home, except for getting medical care. Do not go to work, school, or public areas, and do not use public transportation or taxis.  Call ahead before visiting your doctor Before your medical appointment, call the healthcare provider and tell them that you have, or are being evaluated for, COVID-19 infection. This will help the healthcare provider's office take steps to keep other people from getting infected. Ask your healthcare provider to call the local or state health department.  Monitor your symptoms Seek prompt medical attention if your illness is worsening (e.g., difficulty breathing). Before going to your medical appointment, call the healthcare provider and tell them that you have, or are being evaluated for, COVID-19 infection. Ask your healthcare provider to call the local or state health department.  Wear a facemask You should wear a facemask that covers your nose and mouth when you are in the same room with other people and when you visit a healthcare provider. People who live with or visit you should also wear a facemask while they are in the same room with you.  Separate yourself from other people in your home As much as possible, you should stay in a different room from other people in your home. Also, you should use a separate bathroom, if available.  Avoid sharing household items You should not share dishes, drinking glasses, cups, eating utensils, towels, bedding, or other items with other people in your home. After using these items, you should wash them thoroughly with soap and water.  Cover your coughs and  sneezes Cover your mouth and nose with a tissue when you cough or sneeze, or you can cough or sneeze into your sleeve. Throw used tissues in a lined trash can, and immediately wash your hands with soap and water for at least 20 seconds or use an alcohol-based hand rub.  Wash your [facility] your hands often and thoroughly with soap and water for at least 20 seconds. You can use an alcohol-based hand sanitizer if soap and water are not available and if your hands are not visibly dirty. Avoid touching your eyes, nose, and mouth with unwashed hands.   Prevention Steps for Caregivers and Household Members of Individuals Confirmed to have, or Being Evaluated for, COVID-19 Infection Being Cared for in the Home  If you live with, or provide care at home for, a person confirmed to have, or being evaluated for, COVID-19 infection please follow these guidelines to prevent infection:  Follow healthcare provider's instructions Make sure that you understand and can help the patient follow any healthcare provider instructions for all care.  Provide for the patient's basic needs You should help the patient with basic needs in the home and provide support for getting groceries, prescriptions, and other personal needs.  Monitor the patient's symptoms If they are getting sicker, call his or her medical provider and tell them that the patient has, or is being evaluated for, COVID-19 infection. This will help the healthcare provider's office take steps to keep other people from getting infected. Ask the healthcare provider to call the local or state health department.  Limit the number of people who have contact with the patient  If possible, have only one caregiver  for the patient.  Other household members should stay in another home or place of residence. If this is not possible, they should stay  in another room, or be separated from the patient as much as possible. Use a separate bathroom, if  available.  Restrict visitors who do not have an essential need to be in the home.  Keep older adults, very young children, and other sick people away from the patient Keep older adults, very young children, and those who have compromised immune systems or chronic health conditions away from the patient. This includes people with chronic heart, lung, or kidney conditions, diabetes, and cancer.  Ensure good ventilation Make sure that shared spaces in the home have good air flow, such as from an air conditioner or an opened window, weather permitting.  Wash your hands often  Wash your hands often and thoroughly with soap and water for at least 20 seconds. You can use an alcohol based hand sanitizer if soap and water are not available and if your hands are not visibly dirty.  Avoid touching your eyes, nose, and mouth with unwashed hands.  Use disposable paper towels to dry your hands. If not available, use dedicated cloth towels and replace them when they become wet.  Wear a facemask and gloves  Wear a disposable facemask at all times in the room and gloves when you touch or have contact with the patient's blood, body fluids, and/or secretions or excretions, such as sweat, saliva, sputum, nasal mucus, vomit, urine, or feces.  Ensure the mask fits over your nose and mouth tightly, and do not touch it during use.  Throw out disposable facemasks and gloves after using them. Do not reuse.  Wash your hands immediately after removing your facemask and gloves.  If your personal clothing becomes contaminated, carefully remove clothing and launder. Wash your hands after handling contaminated clothing.  Place all used disposable facemasks, gloves, and other waste in a lined container before disposing them with other household waste.  Remove gloves and wash your hands immediately after handling these items.  Do not share dishes, glasses, or other household items with the patient  Avoid sharing  household items. You should not share dishes, drinking glasses, cups, eating utensils, towels, bedding, or other items with a patient who is confirmed to have, or being evaluated for, COVID-19 infection.  After the person uses these items, you should wash them thoroughly with soap and water.  Wash laundry thoroughly  Immediately remove and wash clothes or bedding that have blood, body fluids, and/or secretions or excretions, such as sweat, saliva, sputum, nasal mucus, vomit, urine, or feces, on them.  Wear gloves when handling laundry from the patient.  Read and follow directions on labels of laundry or clothing items and detergent. In general, wash and dry with the warmest temperatures recommended on the label.  Clean all areas the individual has used often  Clean all touchable surfaces, such as counters, tabletops, doorknobs, bathroom fixtures, toilets, phones, keyboards, tablets, and bedside tables, every day. Also, clean any surfaces that may have blood, body fluids, and/or secretions or excretions on them.  Wear gloves when cleaning surfaces the patient has come in contact with.  Use a diluted bleach solution (e.g., dilute bleach with 1 part bleach and 10 parts water) or a household disinfectant with a label that says EPA-registered for coronaviruses. To make a bleach solution at home, add 1 tablespoon of bleach to 1 quart (4 cups) of water. For a larger supply,  add  cup of bleach to 1 gallon (16 cups) of water.  Read labels of cleaning products and follow recommendations provided on product labels. Labels contain instructions for safe and effective use of the cleaning product including precautions you should take when applying the product, such as wearing gloves or eye protection and making sure you have good ventilation during use of the product.  Remove gloves and wash hands immediately after cleaning.  Monitor yourself for signs and symptoms of illness Caregivers and household  members are considered close contacts, should monitor their health, and will be asked to limit movement outside of the home to the extent possible. Follow the monitoring steps for close contacts listed on the symptom monitoring form.   ? If you have additional questions, contact your local health department or call the epidemiologist on call at [phone] (available 24/7). ? This guidance is subject to change. For the most up-to-date guidance from CDC, please refer to their website: YouBlogs.pl    Activity:  As tolerated with Full fall precautions use walker/cane & assistance as needed  Discharge Instructions    Diet - low sodium heart healthy   Complete by: As directed    Discharge instructions   Complete by: As directed    1.)  3 weeks of isolation from your first positive Covid test.  2.)  Please follow-up with your primary nephrologist in 1 week-you will need repeat chemistry panel at that visit.   Follow with Primary MD  [name], FNP in 1-2 weeks  Please get a complete blood count and chemistry panel checked by your Primary MD at your next visit, and again as instructed by your Primary MD.  Get Medicines reviewed and adjusted: Please take all your medications with you for your next visit with your Primary MD  Laboratory/radiological data: Please request your Primary MD to go over all hospital tests and procedure/radiological results at the follow up, please ask your Primary MD to get all Hospital records sent to his/her office.  In some cases, they will be blood work, cultures and biopsy results pending at the time of your discharge. Please request that your primary care M.D. follows up on these results.  Also Note the following: If you experience worsening of your admission symptoms, develop shortness of breath, life threatening emergency, suicidal or homicidal thoughts you must seek medical attention  immediately by calling 911 or calling your MD immediately  if symptoms less severe.  You must read complete instructions/literature along with all the possible adverse reactions/side effects for all the Medicines you take and that have been prescribed to you. Take any new Medicines after you have completely understood and accpet all the possible adverse reactions/side effects.   Do not drive when taking Pain medications or sleeping medications (Benzodaizepines)  Do not take more than prescribed Pain, Sleep and Anxiety Medications. It is not advisable to combine anxiety,sleep and pain medications without talking with your primary care practitioner  Special Instructions: If you have smoked or chewed Tobacco  in the last 2 yrs please stop smoking, stop any regular Alcohol  and or any Recreational drug use.  Wear Seat belts while driving.  Please note: You were cared for by a hospitalist during your hospital stay. Once you are discharged, your primary care physician will handle any further medical issues. Please note that NO REFILLS for any discharge medications will be authorized once you are discharged, as it is imperative that you return to your primary care physician (or  establish a relationship with a primary care physician if you do not have one) for your post hospital discharge needs so that they can reassess your need for medications and monitor your lab values.   Increase activity slowly   Complete by: As directed      Allergies as of 05/18/2019      Reactions   Levofloxacin Itching, Rash   Tape Other (See Comments)   "Certain surgical tapes peel off my skin"      Medication List    STOP taking these medications   metolazone 2.5 MG tablet Commonly known as: ZAROXOLYN     TAKE these medications   ARANESP (ALBUMIN FREE) IJ Inject as directed every 14 (fourteen) days.   aspirin EC 81 MG tablet Take 81 mg by mouth daily.   azaTHIOprine 50 MG tablet Commonly known as:  IMURAN Take 100 mg by mouth daily.   benzonatate 100 MG capsule Commonly known as: TESSALON Take 1 capsule (100 mg total) by mouth 3 (three) times daily as needed for cough.   colchicine 0.6 MG tablet Take 0.6-1.2 mg by mouth See admin instructions. Take 1.2 mg by mouth at onset, then take 0.6 mg three times a day as need for gout flare   fluticasone 50 MCG/ACT nasal spray Commonly known as: FLONASE Place 1 spray into both nostrils daily as needed for allergies or rhinitis.   furosemide 40 MG tablet Commonly known as: LASIX Take 80 mg by mouth daily. What changed: Another medication with the same name was removed. Continue taking this medication, and follow the directions you see here.   gabapentin 100 MG capsule Commonly known as: NEURONTIN Take 100 mg by mouth 2 (two) times daily.   latanoprost 0.005 % ophthalmic solution Commonly known as: XALATAN Place 1 drop into the left eye at bedtime.   lidocaine 5 % Commonly known as: LIDODERM Place 1 patch onto the skin daily as needed (for pain- remove old patch first).   metoCLOPramide 5 MG tablet Commonly known as: REGLAN Take 1 tablet (5 mg total) by mouth 2 (two) times daily.   midodrine 5 MG tablet Commonly known as: PROAMATINE Take 1 tablet (5 mg total) by mouth 2 (two) times daily with a meal.   nystatin 100000 UNIT/ML suspension Commonly known as: MYCOSTATIN Use as directed 5 mLs in the mouth or throat daily as needed (FOR THRUSH).   omeprazole 40 MG capsule Commonly known as: PRILOSEC Take 40 mg by mouth 2 (two) times daily.   ondansetron 4 MG disintegrating tablet Commonly known as: ZOFRAN-ODT Take 4 mg by mouth every 8 (eight) hours as needed for nausea or vomiting.   oxyCODONE-acetaminophen 10-325 MG tablet Commonly known as: PERCOCET Take 1 tablet by mouth See admin instructions. Take 1 tablet by mouth every four to six hours as needed for pain   predniSONE 5 MG tablet Commonly known as: DELTASONE Take  5 mg by mouth daily.   Proventil HFA 108 (90 Base) MCG/ACT inhaler Generic drug: albuterol Inhale 2 puffs into the lungs 4 (four) times daily as needed for shortness of breath.   sodium bicarbonate 650 MG tablet Take 1 tablet (650 mg total) by mouth 2 (two) times daily.   tacrolimus 1 MG capsule Commonly known as: PROGRAF Take 2-3 mg by mouth See admin instructions. Take 3 mg by mouth in the morning and 2 mg at bedtime   Vitamin D (Ergocalciferol) 1.25 MG (50000 UNIT) Caps capsule Commonly known as: DRISDOL Take 50,000 Units  by mouth every Sunday.   zolpidem 10 MG tablet Commonly known as: AMBIEN Take 10 mg by mouth at bedtime as needed for sleep.      Follow-up Information    Loren Racer, PA-C Follow up on 05/23/2019.   Specialty: Nephrology Why: Appointment at 3:30 PM.  This is on behalf of [facility] for ongoing management of your kidney transplant/declining renal function. Contact information: [place] [address] [phone]        [name], FNP. Schedule an appointment as soon as possible for a visit in 1 week(s).   Specialty: Family Medicine Contact information: [place] [address] [phone]        Geralynn Rile, MD. Schedule an appointment as soon as possible for a visit in 2 week(s).   Specialties: Internal Medicine, Cardiology, Radiology Contact information: [place] [address] [phone]          Allergies  Allergen Reactions  . Levofloxacin Itching and Rash  . Tape Other (See Comments)    "Certain surgical tapes peel off my skin"    Other Procedures/Studies: CT ABDOMEN PELVIS WO CONTRAST  Result Date: 05/12/2019 CLINICAL DATA:  Abdominal pain and vomiting, COVID-19 positivity EXAM: CT ABDOMEN AND PELVIS WITHOUT CONTRAST TECHNIQUE: Multidetector CT imaging of the abdomen and pelvis was performed following the standard protocol without IV contrast. COMPARISON:   08/09/2017, 08/25/2018 FINDINGS: Lower chest: Bilateral pleural effusions are noted right greater than left with associated compressive atelectatic changes. Hepatobiliary: No focal liver abnormality is seen. No gallstones, gallbladder wall thickening, or biliary dilatation. Pancreas: Unremarkable. No pancreatic ductal dilatation or surrounding inflammatory changes. Spleen: Splenic granulomas are noted. Adrenals/Urinary Tract: Adrenal glands appear within normal limits. The native kidneys have been surgically removed. A transplant kidney is noted in the right pelvis. No obstructive changes are seen. 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 Stomach/Bowel: No obstructive or inflammatory changes of the bowel are noted. Vascular/Lymphatic: Vascular calcifications are noted. Some stable retroperitoneal lymph nodes are seen. Given their long-term stability there felt to be benign in etiology. Prominence of the azygos and hemi azygous system in the retrocrural space is noted. Reproductive: Uterus and bilateral adnexa are unremarkable. Other: Changes of diffuse anasarca.  Minimal ascites is seen. Musculoskeletal: No acute bony abnormality is noted. 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 IMPRESSION: Minimal ascites and mild changes of anasarca. Bilateral pleural effusions right greater than left with associated basilar atelectasis. 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 No acute abnormality is noted within the abdomen. Electronically Signed   By: Inez Catalina M.D.   On: 05/12/2019 14:51   US Renal Transplant w/Doppler  Result Date: 05/10/2019 CLINICAL DATA:  Acute kidney injury EXAM: ULTRASOUND OF RENAL TRANSPLANT WITH RENAL DOPPLER ULTRASOUND TECHNIQUE: Ultrasound examination of the renal transplant was performed with gray-scale, color and duplex doppler evaluation. COMPARISON:  August 12, 2017 FINDINGS: Transplant kidney  location: RLQ Transplant Kidney: Renal measurements: 10.8 x 5.6 x 5 cm = volume: There is 157mL. Normal in size and parenchymal echogenicity. No evidence of mass or hydronephrosis. No peri-transplant fluid collection seen. Color flow in the main renal artery:  Yes Color flow in the main renal vein:  Yes Duplex Doppler Evaluation: Main Renal Artery Resistive Index: 0.82 Venous waveform in main renal vein:  Present Intrarenal resistive index in upper pole:  0.56 (normal 0.6-0.8; equivocal 0.8-0.9; abnormal >= 0.9)  Intrarenal resistive index in lower pole: 0.55 (normal 0.6-0.8; equivocal 0.8-0.9; abnormal >= 0.9) Bladder: The bladder is decompressed which limits evaluation. Other findings:  None. IMPRESSION: 1. Renal transplant in the right lower quadrant without evidence for hydronephrosis. 2. Patent main renal artery and vein. 3. Relatively normal resistive indices without significant change from study in 2019. Electronically Signed   By: Constance Holster M.D.   On: 05/10/2019 18:51   US RENAL  Result Date: 05/09/2019 CLINICAL DATA:  AKI, renal transplant EXAM: RENAL / URINARY TRACT ULTRASOUND COMPLETE COMPARISON:  None. FINDINGS: Kidneys: Native kidneys are absent surgically. Right lower quadrant transplant kidney measures 10.7 x 4.9 x 4.8 cm = volume 131.5 mL. No hydronephrosis. Bladder: Appears normal for degree of bladder distention. Other: None. IMPRESSION: Bilateral nephrectomy with right lower quadrant transplant. No hydronephrosis. Electronically Signed   By: Macy Mis M.D.   On: 05/09/2019 15:10   DG CHEST PORT 1 VIEW  Result Date: 05/16/2019 CLINICAL DATA:  History of COVID-19 positivity EXAM: PORTABLE CHEST 1 VIEW COMPARISON:  05/08/2019 FINDINGS: Cardiac shadow is stable. SVC stent is again seen. Lungs are well aerated bilaterally. Small pleural effusions right greater than left are noted. Mild bibasilar atelectatic changes are seen as well. Postsurgical changes in the cervical spine are  noted. IMPRESSION: New small pleural effusions right greater than left with associated atelectatic changes. Electronically Signed   By: Inez Catalina M.D.   On: 05/16/2019 22:58   DG Chest Portable 1 View  Result Date: 05/09/2019 CLINICAL DATA:  Hypotension. EXAM: PORTABLE CHEST 1 VIEW COMPARISON:  05/18/2019 FINDINGS: Cardiac silhouette is stable. Again noted is an SVC stent. There is vascular congestion without overt pulmonary edema. There is a new dense airspace opacity at the left lung base concerning for pneumonia or aspiration. There is no pneumothorax. No significant pleural effusion. No acute osseous abnormality. IMPRESSION: Findings concerning for developing left lower lobe pneumonia in the appropriate clinical setting. Electronically Signed   By: Constance Holster M.D.   On: 05/09/2019 00:13   DG Abd Portable 1V  Result Date: 05/10/2019 CLINICAL DATA:  Abdominal pain. EXAM: PORTABLE ABDOMEN - 1 VIEW COMPARISON:  Plain films of the abdomen 06/17/2015. FINDINGS: The bowel gas pattern is normal. No radio-opaque calculi or other significant radiographic abnormality are seen. Multiple surgical clips in the pelvis and upper abdomen are unchanged. IMPRESSION: No acute finding. Electronically Signed   By: Inge Rise M.D.   On: 05/10/2019 13:55   VAS [name] LOWER EXTREMITY VENOUS (DVT)  Result Date: 05/16/2019  Lower Venous DVTStudy Indications: Elevated ddimer, covid.  Comparison Study: 08/25/18 previous Performing Technologist: Abram Sander RVS  Examination Guidelines: A complete evaluation includes B-mode imaging, spectral Doppler, color Doppler, and power Doppler as needed of all accessible portions of each vessel. Bilateral testing is considered an integral part of a complete examination. Limited examinations for reoccurring indications may be performed as noted. The reflux portion of the exam is performed with the patient in reverse Trendelenburg.   +---------+---------------+---------+-----------+----------+--------------+ RIGHT    CompressibilityPhasicitySpontaneityPropertiesThrombus Aging +---------+---------------+---------+-----------+----------+--------------+ CFV      Full           Yes      Yes                                 +---------+---------------+---------+-----------+----------+--------------+ SFJ      Full                                                        +---------+---------------+---------+-----------+----------+--------------+  FV Prox  Full                                                        +---------+---------------+---------+-----------+----------+--------------+ FV Mid   Full                                                        +---------+---------------+---------+-----------+----------+--------------+ FV DistalFull                                                        +---------+---------------+---------+-----------+----------+--------------+ PFV      Full                                                        +---------+---------------+---------+-----------+----------+--------------+ POP      Full           Yes      Yes                                 +---------+---------------+---------+-----------+----------+--------------+ PTV      Full                                                        +---------+---------------+---------+-----------+----------+--------------+ PERO                                                  Not visualized +---------+---------------+---------+-----------+----------+--------------+   +---------+---------------+---------+-----------+----------+--------------+ LEFT     CompressibilityPhasicitySpontaneityPropertiesThrombus Aging +---------+---------------+---------+-----------+----------+--------------+ CFV      Full           Yes      Yes                                  +---------+---------------+---------+-----------+----------+--------------+ SFJ      Full                                                        +---------+---------------+---------+-----------+----------+--------------+ FV Prox  Full                                                        +---------+---------------+---------+-----------+----------+--------------+  FV Mid   Full                                                        +---------+---------------+---------+-----------+----------+--------------+ FV DistalFull                                                        +---------+---------------+---------+-----------+----------+--------------+ PFV      Full                                                        +---------+---------------+---------+-----------+----------+--------------+ POP      Full           Yes      Yes                                 +---------+---------------+---------+-----------+----------+--------------+ PTV      Full                                                        +---------+---------------+---------+-----------+----------+--------------+ PERO     Full                                                        +---------+---------------+---------+-----------+----------+--------------+     Summary: BILATERAL: - No evidence of deep vein thrombosis seen in the lower extremities, bilaterally.   *See table(s) above for measurements and observations. Electronically signed by Deitra Mayo MD on 05/16/2019 at 3:16:14 PM.    Final      TODAY-DAY OF DISCHARGE:  Subjective:   [name] today has no headache,no chest abdominal pain,no new weakness tingling or numbness, feels much better wants to go home today.   Objective:   Blood pressure (!) 96/55, pulse 66, temperature 98.3 F (36.8 C), temperature source Oral, resp. rate 20, height 4\' 11"  (1.499 m), weight 59 kg, SpO2 96 %.  Intake/Output Summary (Last 24  hours) at 05/18/2019 1049 Last data filed at 05/18/2019 0900 Gross per 24 hour  Intake 720 ml  Output 602 ml  Net 118 ml   Filed Weights   05/09/19 0049 05/09/19 1649 05/17/19 1100  Weight: 52.3 kg 52.2 kg 59 kg    Exam: Awake Alert, Oriented *3, No new F.N deficits, Normal affect [place].AT,PERRAL Supple Neck,No JVD, No cervical lymphadenopathy appriciated.  Symmetrical Chest wall movement, Good air movement bilaterally, CTAB RRR,No Gallops,Rubs or new Murmurs, No Parasternal Heave +ve B.Sounds, Abd Soft, Non tender, No organomegaly appriciated, No rebound -guarding or rigidity. No Cyanosis, Clubbing or edema, No new Rash or bruise   PERTINENT RADIOLOGIC STUDIES: CT ABDOMEN PELVIS WO CONTRAST  Result Date: 05/12/2019 CLINICAL DATA:  Abdominal pain and vomiting, COVID-19 positivity EXAM: CT ABDOMEN AND PELVIS WITHOUT CONTRAST TECHNIQUE: Multidetector CT imaging of the abdomen and pelvis was performed following the standard protocol without IV contrast. COMPARISON:  08/09/2017, 08/25/2018 FINDINGS: Lower chest: Bilateral pleural effusions are noted right greater than left with associated compressive atelectatic changes. Hepatobiliary: No focal liver abnormality is seen. No gallstones, gallbladder wall thickening, or biliary dilatation. Pancreas: Unremarkable. No pancreatic ductal dilatation or surrounding inflammatory changes. Spleen: Splenic granulomas are noted. Adrenals/Urinary Tract: Adrenal glands appear within normal limits. The native kidneys have been surgically removed. A transplant kidney is noted in the right pelvis. No obstructive changes are seen. 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 Stomach/Bowel: No obstructive or inflammatory changes of the bowel are noted. Vascular/Lymphatic: Vascular calcifications are noted. Some stable retroperitoneal lymph nodes are seen. Given their long-term stability there felt to be benign in etiology. Prominence of the  azygos and hemi azygous system in the retrocrural space is noted. Reproductive: Uterus and bilateral adnexa are unremarkable. Other: Changes of diffuse anasarca.  Minimal ascites is seen. Musculoskeletal: No acute bony abnormality is noted. 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 IMPRESSION: Minimal ascites and mild changes of anasarca. Bilateral pleural effusions right greater than left with associated basilar atelectasis. 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 No acute abnormality is noted within the abdomen. Electronically Signed   By: Inez Catalina M.D.   On: 05/12/2019 14:51   US Renal Transplant w/Doppler  Result Date: 05/10/2019 CLINICAL DATA:  Acute kidney injury EXAM: ULTRASOUND OF RENAL TRANSPLANT WITH RENAL DOPPLER ULTRASOUND TECHNIQUE: Ultrasound examination of the renal transplant was performed with gray-scale, color and duplex doppler evaluation. COMPARISON:  August 12, 2017 FINDINGS: Transplant kidney location: RLQ Transplant Kidney: Renal measurements: 10.8 x 5.6 x 5 cm = volume: There is 130mL. Normal in size and parenchymal echogenicity. No evidence of mass or hydronephrosis. No peri-transplant fluid collection seen. Color flow in the main renal artery:  Yes Color flow in the main renal vein:  Yes Duplex Doppler Evaluation: Main Renal Artery Resistive Index: 0.82 Venous waveform in main renal vein:  Present Intrarenal resistive index in upper pole:  0.56 (normal 0.6-0.8; equivocal 0.8-0.9; abnormal >= 0.9) Intrarenal resistive index in lower pole: 0.55 (normal 0.6-0.8; equivocal 0.8-0.9; abnormal >= 0.9) Bladder: The bladder is decompressed which limits evaluation. Other findings:  None. IMPRESSION: 1. Renal transplant in the right lower quadrant without evidence for hydronephrosis. 2. Patent main renal artery and vein. 3. Relatively normal resistive indices without significant change from study in 2019. Electronically Signed   By:  Constance Holster M.D.   On: 05/10/2019 18:51   US RENAL  Result Date: 05/09/2019 CLINICAL DATA:  AKI, renal transplant EXAM: RENAL / URINARY TRACT ULTRASOUND COMPLETE COMPARISON:  None. FINDINGS: Kidneys: Native kidneys are absent surgically. Right lower quadrant transplant kidney measures 10.7 x 4.9 x 4.8 cm = volume 131.5 mL. No hydronephrosis. Bladder: Appears normal for degree of bladder distention. Other: None. IMPRESSION: Bilateral nephrectomy with right lower quadrant transplant. No hydronephrosis. Electronically Signed   By: Macy Mis M.D.   On: 05/09/2019 15:10   DG CHEST PORT 1 VIEW  Result Date: 05/16/2019 CLINICAL DATA:  History of COVID-19 positivity EXAM: PORTABLE CHEST 1 VIEW COMPARISON:  05/08/2019 FINDINGS: Cardiac shadow is stable. SVC stent is again seen. Lungs are well aerated bilaterally. Small pleural effusions right greater than  left are noted. Mild bibasilar atelectatic changes are seen as well. Postsurgical changes in the cervical spine are noted. IMPRESSION: New small pleural effusions right greater than left with associated atelectatic changes. Electronically Signed   By: Inez Catalina M.D.   On: 05/16/2019 22:58   DG Chest Portable 1 View  Result Date: 05/09/2019 CLINICAL DATA:  Hypotension. EXAM: PORTABLE CHEST 1 VIEW COMPARISON:  05/18/2019 FINDINGS: Cardiac silhouette is stable. Again noted is an SVC stent. There is vascular congestion without overt pulmonary edema. There is a new dense airspace opacity at the left lung base concerning for pneumonia or aspiration. There is no pneumothorax. No significant pleural effusion. No acute osseous abnormality. IMPRESSION: Findings concerning for developing left lower lobe pneumonia in the appropriate clinical setting. Electronically Signed   By: Constance Holster M.D.   On: 05/09/2019 00:13   DG Abd Portable 1V  Result Date: 05/10/2019 CLINICAL DATA:  Abdominal pain. EXAM: PORTABLE ABDOMEN - 1 VIEW COMPARISON:  Plain films  of the abdomen 06/17/2015. FINDINGS: The bowel gas pattern is normal. No radio-opaque calculi or other significant radiographic abnormality are seen. Multiple surgical clips in the pelvis and upper abdomen are unchanged. IMPRESSION: No acute finding. Electronically Signed   By: Inge Rise M.D.   On: 05/10/2019 13:55   VAS [name] LOWER EXTREMITY VENOUS (DVT)  Result Date: 05/16/2019  Lower Venous DVTStudy Indications: Elevated ddimer, covid.  Comparison Study: 08/25/18 previous Performing Technologist: Abram Sander RVS  Examination Guidelines: A complete evaluation includes B-mode imaging, spectral Doppler, color Doppler, and power Doppler as needed of all accessible portions of each vessel. Bilateral testing is considered an integral part of a complete examination. Limited examinations for reoccurring indications may be performed as noted. The reflux portion of the exam is performed with the patient in reverse Trendelenburg.  +---------+---------------+---------+-----------+----------+--------------+ RIGHT    CompressibilityPhasicitySpontaneityPropertiesThrombus Aging +---------+---------------+---------+-----------+----------+--------------+ CFV      Full           Yes      Yes                                 +---------+---------------+---------+-----------+----------+--------------+ SFJ      Full                                                        +---------+---------------+---------+-----------+----------+--------------+ FV Prox  Full                                                        +---------+---------------+---------+-----------+----------+--------------+ FV Mid   Full                                                        +---------+---------------+---------+-----------+----------+--------------+ FV DistalFull                                                        +---------+---------------+---------+-----------+----------+--------------+  PFV      Full                                                         +---------+---------------+---------+-----------+----------+--------------+ POP      Full           Yes      Yes                                 +---------+---------------+---------+-----------+----------+--------------+ PTV      Full                                                        +---------+---------------+---------+-----------+----------+--------------+ PERO                                                  Not visualized +---------+---------------+---------+-----------+----------+--------------+   +---------+---------------+---------+-----------+----------+--------------+ LEFT     CompressibilityPhasicitySpontaneityPropertiesThrombus Aging +---------+---------------+---------+-----------+----------+--------------+ CFV      Full           Yes      Yes                                 +---------+---------------+---------+-----------+----------+--------------+ SFJ      Full                                                        +---------+---------------+---------+-----------+----------+--------------+ FV Prox  Full                                                        +---------+---------------+---------+-----------+----------+--------------+ FV Mid   Full                                                        +---------+---------------+---------+-----------+----------+--------------+ FV DistalFull                                                        +---------+---------------+---------+-----------+----------+--------------+ PFV      Full                                                        +---------+---------------+---------+-----------+----------+--------------+  POP      Full           Yes      Yes                                 +---------+---------------+---------+-----------+----------+--------------+ PTV      Full                                                         +---------+---------------+---------+-----------+----------+--------------+ PERO     Full                                                        +---------+---------------+---------+-----------+----------+--------------+     Summary: BILATERAL: - No evidence of deep vein thrombosis seen in the lower extremities, bilaterally.   *See table(s) above for measurements and observations. Electronically signed by Deitra Mayo MD on 05/16/2019 at 3:16:14 PM.    Final      PERTINENT LAB RESULTS: CBC: Recent Labs    05/17/19 0235 05/18/19 0249  WBC 3.4* 3.2*  HGB 8.4* 8.1*  HCT 26.3* 25.5*  PLT 156 149*   CMET CMP     Component Value Date/Time   NA 141 05/18/2019 0249   NA 138 05/08/2019 0854   K 3.8 05/18/2019 0249   CL 103 05/18/2019 0249   CO2 27 05/18/2019 0249   GLUCOSE 113 (H) 05/18/2019 0249   BUN 66 (H) 05/18/2019 0249   BUN 82 (HH) 05/08/2019 0854   CREATININE 3.11 (H) 05/18/2019 0249   CALCIUM 8.8 (L) 05/18/2019 0249   CALCIUM 9.0 04/10/2019 0828   PROT 5.5 (L) 05/16/2019 0310   ALBUMIN 3.0 (L) 05/18/2019 0249   AST 33 05/16/2019 0310   ALT 17 05/16/2019 0310   ALKPHOS 99 05/16/2019 0310   BILITOT 1.1 05/16/2019 0310   GFRNONAA 17 (L) 05/18/2019 0249   GFRAA 19 (L) 05/18/2019 0249    GFR Estimated Creatinine Clearance: 16.7 mL/min (A) (by C-G formula based on SCr of 3.11 mg/dL (H)). No results for input(s): LIPASE, AMYLASE in the last 72 hours. No results for input(s): CKTOTAL, CKMB, CKMBINDEX, TROPONINI in the last 72 hours. Invalid input(s): POCBNP No results for input(s): DDIMER in the last 72 hours. No results for input(s): HGBA1C in the last 72 hours. No results for input(s): CHOL, HDL, LDLCALC, TRIG, CHOLHDL, LDLDIRECT in the last 72 hours. No results for input(s): TSH, T4TOTAL, T3FREE, THYROIDAB in the last 72 hours.  Invalid input(s): FREET3 No results for input(s): VITAMINB12, FOLATE, FERRITIN, TIBC, IRON, RETICCTPCT in the last 72  hours. Coags: No results for input(s): INR in the last 72 hours.  Invalid input(s): PT Microbiology: Recent Results (from the past 240 hour(s))  Blood culture (routine x 2)     Status: None   Collection Time: 05/09/19 12:21 AM   Specimen: BLOOD RIGHT HAND  Result Value Ref Range Status   Specimen Description BLOOD RIGHT HAND  Final   Special Requests   Final    BOTTLES DRAWN AEROBIC AND ANAEROBIC Blood Culture results may not be optimal due to an inadequate volume of  blood received in culture bottles   Culture   Final    NO GROWTH 5 DAYS Performed at [facility], [place] [address], [place], [place] [address]    Report Status 05/14/2019 FINAL  Final  SARS CORONAVIRUS 2 (TAT 6-24 HRS) Nasopharyngeal Nasopharyngeal Swab     Status: Abnormal   Collection Time: 05/09/19 12:25 AM   Specimen: Nasopharyngeal Swab  Result Value Ref Range Status   SARS Coronavirus 2 POSITIVE (A) NEGATIVE Final    Comment: RESULT CALLED TO, READ BACK BY AND VERIFIED WITH: RN OSORIO BAILEY AT 0620 ON 05/09/2019 BY MESSAN HOUEGNIFIO (NOTE) SARS-CoV-2 target nucleic acids are DETECTED. The SARS-CoV-2 RNA is generally detectable in upper and lower respiratory specimens during the acute phase of infection. Positive results are indicative of the presence of SARS-CoV-2 RNA. Clinical correlation with patient history and other diagnostic information is  necessary to determine patient infection status. Positive results do not rule out bacterial infection or co-infection with other viruses.  The expected result is Negative. Fact Sheet for Patients: SugarRoll.be Fact Sheet for Healthcare Providers: https://www.woods-mathews.com/ This test is not yet approved or cleared by the [place] FDA and  has been authorized for detection and/or diagnosis of SARS-CoV-2 by FDA under an Emergency Use Authorization (EUA). This EUA will remain  in effect (meaning this tes t can be  used) for the duration of the COVID-19 declaration under Section 564(b)(1) of the Act, 21 U.S.C. section 360bbb-3(b)(1), unless the authorization is terminated or revoked sooner. Performed at [facility], [place] [address] [place], [place] [address]   Gram stain     Status: None   Collection Time: 05/09/19  5:26 AM   Specimen: Urine, Random  Result Value Ref Range Status   Specimen Description URINE, RANDOM  Final   Special Requests Immunocompromised  Final   Gram Stain   Final    WBC PRESENT,BOTH PMN AND MONONUCLEAR GRAM POSITIVE COCCI IN PAIRS GRAM VARIABLE ROD CYTOSPIN SMEAR Performed at [facility], [address], [place], [place] [address]    Report Status 05/09/2019 FINAL  Final  Urine culture     Status: Abnormal   Collection Time: 05/09/19  5:26 AM   Specimen: Urine, Random  Result Value Ref Range Status   Specimen Description URINE, RANDOM  Final   Special Requests Immunocompromised  Final   Culture (A)  Final    10,000 COLONIES/mL DIPHTHEROIDS(CORYNEBACTERIUM SPECIES) Standardized susceptibility testing for this organism is not available. Performed at [facility], [place] [address], [place], [place] [address]    Report Status 05/10/2019 FINAL  Final  Blood culture (routine x 2)     Status: None   Collection Time: 05/09/19  6:18 AM   Specimen: BLOOD RIGHT HAND  Result Value Ref Range Status   Specimen Description BLOOD RIGHT HAND  Final   Special Requests   Final    BOTTLES DRAWN AEROBIC AND ANAEROBIC Blood Culture results may not be optimal due to an inadequate volume of blood received in culture bottles   Culture   Final    NO GROWTH 5 DAYS Performed at [facility], [place] [address] [place], [place] [address]    Report Status 05/14/2019 FINAL  Final    FURTHER DISCHARGE INSTRUCTIONS:  Get Medicines reviewed and adjusted: Please take all your medications with you for your next visit with your Primary MD  Laboratory/radiological  data: Please request your Primary MD to go over all  hospital tests and procedure/radiological results at the follow up, please ask your Primary MD to get all Hospital records sent to his/her office.  In some cases, they will be blood work, cultures and biopsy results pending at the time of your discharge. Please request that your primary care M.D. goes through all the records of your hospital data and follows up on these results.  Also Note the following: If you experience worsening of your admission symptoms, develop shortness of breath, life threatening emergency, suicidal or homicidal thoughts you must seek medical attention immediately by calling 911 or calling your MD immediately  if symptoms less severe.  You must read complete instructions/literature along with all the possible adverse reactions/side effects for all the Medicines you take and that have been prescribed to you. Take any new Medicines after you have completely understood and accpet all the possible adverse reactions/side effects.   Do not drive when taking Pain medications or sleeping medications (Benzodaizepines)  Do not take more than prescribed Pain, Sleep and Anxiety Medications. It is not advisable to combine anxiety,sleep and pain medications without talking with your primary care practitioner  Special Instructions: If you have smoked or chewed Tobacco  in the last 2 yrs please stop smoking, stop any regular Alcohol  and or any Recreational drug use.  Wear Seat belts while driving.  Please note: You were cared for by a hospitalist during your hospital stay. Once you are discharged, your primary care physician will handle any further medical issues. Please note that NO REFILLS for any discharge medications will be authorized once you are discharged, as it is imperative that you return to your primary care physician (or establish a relationship with a primary care physician if you do not have one) for your post hospital  discharge needs so that they can reassess your need for medications and monitor your lab values.  Total Time spent coordinating discharge including counseling, education and face to face time equals 35 minutes.  SignedOren Binet 05/18/2019 10:49 AM

## 2019-05-18 NOTE — TOC Transition Note (Signed)
Transition of Care [facility]) - CM/SW Discharge Note   Patient Details  Name: Tina Watkins MRN: [id number] Date of Birth: 08-23-1965  [facility] CM/SW Contact:  Maryclare Labrador, RN Phone Number: 05/18/2019, 12:30 PM   Clinical Narrative:   Pt deemed appropriate for discharge home today.  Pt denied [facility] barriers.  Pt informed CM that she has family to help  her at discharge.  Pt declined RW as ordered.  Pt, attending and therapy made aware that CM can not secure HH secondary to insurance and that pt has declined DME as recommended.  CM provided [place] referrals to the following agencies [facility], [facility], [facility], [place], [place], [facility] [place], [facility], [facility]  - all declined.  [facility] will not accept covid positive pts  Per verbal order pt referred to COVID at home program.  CM verbally spoke with program liaison and pt has been accepted.  NP with the program has already made contact with pt, also program aware that pt also has need for physical therapy.  Update 1400: herapy voiced concerns with pt not being able to climb stairs yesterday - recommend ambulance home as pt has stairs to enter her apartment.  CM discussed concern with pt however she declined ambulance ride home, pt stated "my uncle can do it ".  CM again reiterated the concerns with pt maneuvering the stairs and pt again declined for ambulance home. Attending aware    Discharge orders signed.  CM signing off    Final next level of care: Home/Self Care Barriers to Discharge: Barriers Resolved   Patient Goals and CMS Choice   CMS Medicare.gov Compare Post Acute Care list provided to:: Patient Choice offered to / list presented to : Patient  Discharge Placement                       Discharge Plan and Services                                     Social Determinants of Health (SDOH) Interventions     Readmission Risk Interventions No flowsheet data  found.

## 2019-05-19 DIAGNOSIS — I509 Heart failure, unspecified: Secondary | ICD-10-CM | POA: Diagnosis not present

## 2019-05-19 DIAGNOSIS — U071 COVID-19: Secondary | ICD-10-CM | POA: Diagnosis not present

## 2019-05-23 DIAGNOSIS — U071 COVID-19: Secondary | ICD-10-CM | POA: Diagnosis not present

## 2019-05-23 DIAGNOSIS — I509 Heart failure, unspecified: Secondary | ICD-10-CM | POA: Diagnosis not present

## 2019-05-28 DIAGNOSIS — D631 Anemia in chronic kidney disease: Secondary | ICD-10-CM | POA: Diagnosis not present

## 2019-05-28 DIAGNOSIS — N183 Chronic kidney disease, stage 3 unspecified: Secondary | ICD-10-CM | POA: Diagnosis not present

## 2019-05-28 DIAGNOSIS — I959 Hypotension, unspecified: Secondary | ICD-10-CM | POA: Diagnosis not present

## 2019-05-28 DIAGNOSIS — U071 COVID-19: Secondary | ICD-10-CM | POA: Diagnosis not present

## 2019-05-28 DIAGNOSIS — I509 Heart failure, unspecified: Secondary | ICD-10-CM | POA: Diagnosis not present

## 2019-05-28 DIAGNOSIS — Z9181 History of falling: Secondary | ICD-10-CM | POA: Diagnosis not present

## 2019-05-28 DIAGNOSIS — Z8673 Personal history of transient ischemic attack (TIA), and cerebral infarction without residual deficits: Secondary | ICD-10-CM | POA: Diagnosis not present

## 2019-05-28 DIAGNOSIS — M5030 Other cervical disc degeneration, unspecified cervical region: Secondary | ICD-10-CM | POA: Diagnosis not present

## 2019-05-28 DIAGNOSIS — K219 Gastro-esophageal reflux disease without esophagitis: Secondary | ICD-10-CM | POA: Diagnosis not present

## 2019-05-28 DIAGNOSIS — Z940 Kidney transplant status: Secondary | ICD-10-CM | POA: Diagnosis not present

## 2019-05-28 DIAGNOSIS — B009 Herpesviral infection, unspecified: Secondary | ICD-10-CM | POA: Diagnosis not present

## 2019-05-28 DIAGNOSIS — I130 Hypertensive heart and chronic kidney disease with heart failure and stage 1 through stage 4 chronic kidney disease, or unspecified chronic kidney disease: Secondary | ICD-10-CM | POA: Diagnosis not present

## 2019-05-29 DIAGNOSIS — Z1159 Encounter for screening for other viral diseases: Secondary | ICD-10-CM | POA: Diagnosis not present

## 2019-05-29 DIAGNOSIS — E7800 Pure hypercholesterolemia, unspecified: Secondary | ICD-10-CM | POA: Diagnosis not present

## 2019-05-29 DIAGNOSIS — Z79899 Other long term (current) drug therapy: Secondary | ICD-10-CM | POA: Diagnosis not present

## 2019-05-29 DIAGNOSIS — M109 Gout, unspecified: Secondary | ICD-10-CM | POA: Diagnosis not present

## 2019-06-01 ENCOUNTER — Other Ambulatory Visit: Payer: Self-pay

## 2019-06-01 ENCOUNTER — Ambulatory Visit: Payer: Medicaid Other | Attending: Family Medicine

## 2019-06-01 DIAGNOSIS — R293 Abnormal posture: Secondary | ICD-10-CM | POA: Diagnosis present

## 2019-06-01 DIAGNOSIS — M542 Cervicalgia: Secondary | ICD-10-CM

## 2019-06-01 DIAGNOSIS — R252 Cramp and spasm: Secondary | ICD-10-CM | POA: Insufficient documentation

## 2019-06-01 NOTE — Therapy (Addendum)
[place], [place], [address] Phone: [phone]   Fax:  [phone]  Physical Therapy Evaluation  Patient Details  Name: Tina Watkins MRN: [id number] Date of Birth: 1965-12-25 Referring Provider (PT): Kathe Becton, MD   Encounter Date: 06/01/2019  PT End of Session - 06/01/19 1502    Visit Number  1    Number of Visits  18    Date for PT Re-Evaluation  07/28/19    Authorization Type  MCD    PT Start Time  0245    PT Stop Time  0340    PT Time Calculation (min)  55 min       Past Medical History:  Diagnosis Date  . Bacteremia due to Gram-negative bacteria 05/23/2011  . Blind    right eye  . Blind right eye   . CHF (congestive heart failure) ([place])   . Chronic lower back pain   . Complication of anesthesia    "woke up during OR; I have an extremely high tolerance" (12/11/2011) 1 procedure was graft; the other procedures were procedures that are typically done with sedation.  . DDD (degenerative disc disease), cervical   . Depression   . Dysrhythmia    "tachycardia" (12/11/2011) new onset afib 10/15/14 EKG  . E coli bacteremia 06/18/2011  . Elevated LDL cholesterol level 12/2018  . ESRD (end stage renal disease) ([place]) 06/12/2011  . Fibromyalgia   . Gastroparesis   . Gastroparesis   . GERD (gastroesophageal reflux disease)   . Glaucoma    right eye  . Gout   . H/O carpal tunnel syndrome   . Headache(784.0)    "not often anymore" (12/11/2011)  . Herpes genitalia 1994  . History of blood transfusion    "more than a few times" (12/11/2011)  . History of stomach ulcers   . Hypotension   . Iron deficiency anemia   . New onset a-fib ([place])    10/15/14 EKG  . Osteopenia   . Peripheral neuropathy 11/2018  . Seizures ([place]) 1994   "post transplant; only have had that one" (12/11/2011)  . Spinal stenosis in cervical region   . Stroke [facility])     left basal ganglia lacunar infarct; Right  frontal lobe lacunar infarct.  . Stroke [facility]) ~ 1999; 2001   "briefly lost my vision; lost my right eye" (12/11/2011)  . Vitamin D deficiency 12/2018    Past Surgical History:  Procedure Laterality Date  . ANTERIOR CERVICAL DECOMP/DISCECTOMY FUSION N/A 01/08/2015   Procedure: Anterior Cervical Three-Four/Four-Five Decompression/Diskectomy/Fusion;  Surgeon: Leeroy Cha, MD;  Location: [place] [facility]O ORS;  Service: Neurosurgery;  Laterality: N/A;  C3-4 C4-5 Anterior cervical decompression/diskectomy/fusion  . APPENDECTOMY  ~ 2004  . CATARACT EXTRACTION     right eye  . COLONOSCOPY    . ENUCLEATION  2001   "right"  . ESOPHAGOGASTRODUODENOSCOPY (EGD) WITH PROPOFOL N/A 04/21/2012   Procedure: ESOPHAGOGASTRODUODENOSCOPY (EGD) WITH PROPOFOL;  Surgeon: Milus Banister, MD;  [facility];  Service: Endoscopy;  Laterality: N/A;  . 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 [place]; 1999; 2005   "right"  . MULTIPLE TOOTH EXTRACTIONS    . RIGHT HEART CATH N/A 03/23/2019   Procedure: RIGHT HEART CATH;  Surgeon: Jolaine Artist, MD;  Location: [place] CV LAB;  Service: Cardiovascular;  Laterality: N/A;  . TONSILLECTOMY    .  TOTAL NEPHRECTOMY  1988?; 1994; 2005    There were no vitals filed for this visit.   Subjective Assessment - 06/01/19 1453    Subjective  She reports  in PT for weakness, chronic neck and upper back pain.  Gets massage at home an limited benfit.  Previous episode of PT was linmited in benfit.   Gout flare now using walker.    Pertinent History  A CDF,  kidney Sx,   , anemia.  ,  CHF with fliud around heart/    Limitations  Standing;Walking;Lifting    How long can you walk comfortably?  Household without device at home at times holding to objects and 50-60 feet longest.    Patient Stated Goals  Decrease pain in shoulders in neck.    Currently in Pain?  Yes    Pain Score  --   moderate    Pain Location  Neck    Pain Orientation  Right;Left    Pain Descriptors / Indicators  Aching;Constant    Pain Type  Chronic pain    Pain Onset  More than a month ago    Pain Frequency  Constant    Aggravating Factors   using arms, home tasks    Pain Relieving Factors  patches, meds, massage,         [facility] PT Assessment - 06/01/19 0001      Assessment   Medical Diagnosis  LBP    Referring Provider (PT)  Kathe Becton, MD    Onset Date/Surgical Date  --   2017   Next MD Visit  5./5/21    Prior Therapy  2019 she received PT  at this facility and she stopped coming to PT without notice. She reported benefit during these sessions in 2019 but her pain levels were high at 8/10 and  Again she did not return, The previous PT is aware and the patient is agreeable to have PT with different PT. Clinic is the same.      Precautions   Precautions  Fall      Restrictions   Weight Bearing Restrictions  No      Balance Screen   Has the patient fallen in the past 6 months  No      [place] residence    Living Arrangements  Alone    Type of [place] to enter    Entrance Stairs-Number of Steps  [place]  One level    [place] - 2 wheels;Cane - single point;Shower seat;Bedside commode      Prior Function   Level of Independence  Needs assistance with homemaking;Needs assistance with ADLs;Requires assistive device for independence    Vocation  On disability      Cognition   Overall Cognitive Status  Within Functional Limits for tasks assessed      Posture/Postural Control   Posture Comments  flexed spin , forward lean on walker      ROM / Strength   AROM / PROM / Strength  AROM;Strength;PROM      AROM   AROM Assessment Site  Shoulder;Cervical    Right/Left Shoulder  Right;Left    Right Shoulder Flexion  60 Degrees    Right Shoulder ABduction  50 Degrees    Left Shoulder Flexion  150  Degrees    Left Shoulder ABduction  145 Degrees    Left Shoulder  External Rotation  60 Degrees    Cervical Flexion  WNL    Cervical Extension  100    Cervical - Right Side Bend  40    Cervical - Left Side Bend  40    Cervical - Right Rotation  60    Cervical - Left Rotation  60      PROM   Overall PROM Comments  neck WNL passively       Strength   Overall Strength Comments  Grossly  shoulder WNL   RT shoulder not assessed due to pain from injection into RT shoulder      Ambulation/Gait   Gait Comments  rolling walker  flexed posture                Objective measurements completed on examination: See above findings.              PT Education - 06/01/19 1507    Education Details  POC, HEP    Person(s) Educated  Patient    Methods  Explanation;Demonstration;Verbal cues;Tactile cues;Handout    Comprehension  Verbalized understanding;Returned demonstration       PT Short Term Goals - 06/01/19 1655      PT SHORT TERM GOAL #1   Title  pt to be I with inial HEP    Baseline  no program    Time  3    Period  Weeks    Status  New      PT SHORT TERM GOAL #2   Title  pt to verbalize / demo proper posture in various positions to prevent/ reduce neck/ shoulder pain     Baseline  unaware of posture    Time  4    Period  Weeks    Status  New      PT SHORT TERM GOAL #3   Title  Reduce upper trap/ levator scap tightness to reduce pain to </= 5/10 and promote cervical mobility for therapuetic progression    Baseline  6-8/10    Time  4    Period  Weeks    Status  New        PT Long Term Goals - 06/01/19 1656      PT LONG TERM GOAL #1   Title  improve RT shoulder AR'OM to = Lt to promote improvment with home tasks    Baseline  flexion   60 degrees              abduction 50 degrees    Time  4    Period  Weeks    Status  New      PT LONG TERM GOAL #2   Title  pt will report  sleep disturbance to improve 50%    Baseline  frequent wakening per night     Time  4    Period  Weeks    Status  New      PT LONG TERM GOAL #3   Title  Pt will have no more than min pain at most 3-4/10  with light housework and ADLs.      Baseline  reports 6-8/10 pain  with house work      PT [place] #4   Title  pt to be I with all HEP given as of last visit to maintain and promote current level of function    Baseline  indepndent with intial HEP    Time  8    Period  Weeks  Status  New      PT LONG TERM GOAL #5   Title  She will be able to walk without walker in home and [facility] out of home    Baseline  RW all walking except holfing to objects in home    Time  [place] - 06/01/19 1647    Clinical Impression Statement  Ms Tina Watkins presents with multiple issues. We will concentrste on neck and pper back  pain.  we wll work on leg strength and endurance if we can ease symptoms in her uppr back.  She has significant pain in RT shoulder which limited her ROM and MMT not done due to pain.   She was seen in 2019 for this same issue.   She will benefit from skilled PT but the  amout of progress is unclear.    Personal Factors and Comorbidities  Behavior Pattern;Comorbidity 1;Comorbidity 2;Time since onset of injury/illness/exacerbation;Past/Current Experience;Fitness    Comorbidities  ACDF,  kindey transplant, CVA, renal failure    Examination-Activity Limitations  Locomotion Level;Reach Overhead;Carry;Sleep;Squat;Stairs;Stand    Examination-Participation Restrictions  Meal Prep;Cleaning;Community Activity;Shop;Laundry    Stability/Clinical Decision Making  Evolving/Moderate complexity    Clinical Decision Making  Moderate    PT Treatment/Interventions  Passive range of motion;Manual techniques;Patient/family education;Therapeutic activities;Therapeutic exercise;Balance training;Moist Heat    PT Next Visit Plan  review HEP and add,   moalities and manual for pain,,                assess RT shoulder ROM.    PT Home  Exercise Plan  cervical rotation,  shoulder elevation and retraction    Consulted and Agree with Plan of Care  Patient       Patient will benefit from skilled therapeutic intervention in order to improve the following deficits and impairments:  Pain, Postural dysfunction, Decreased strength, Decreased range of motion, Difficulty walking, Decreased activity tolerance, Decreased balance, Increased muscle spasms, Decreased endurance  Visit Diagnosis: Cervicalgia  Abnormal posture  Cramp and spasm     Problem List Patient Active Problem List   Diagnosis Date Noted  . Pneumonia due to COVID-19 virus 05/09/2019  . Hypotension 05/09/2019  . Anemia 05/09/2019  . CHF (congestive heart failure) ([place]) 05/09/2019  . Swollen abdomen 01/04/2019  . DDD (degenerative disc disease), cervical 12/06/2018  . Neuropathy 12/06/2018  . Paresthesia 10/11/2018  . Neck pain 10/11/2018  . Tobacco abuse 11/20/2017  . Right leg swelling 11/20/2017  . CKD (chronic kidney disease), stage III 11/20/2017  . Right leg pain 11/20/2017  . Stroke (cerebrum) ([place]) 11/20/2017  . GERD (gastroesophageal reflux disease) 11/20/2017  . Acute venous embolism and thrombosis of deep vessels of proximal lower extremity, right ([place]) 11/20/2017  . 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 Thrush, oral   . AKI (acute kidney injury) ([place])   . Multifocal pneumonia 08/09/2017  . Chest pain 09/29/2015  . Cervical stenosis of spinal canal 01/08/2015  . Urinary tract infectious disease   . Muscle spasms of neck 06/15/2014  . Bleeding hemorrhoid 06/15/2014  . Anemia in chronic kidney disease 06/15/2014  . Acute on chronic renal failure ([place]) 06/13/2014  . Pyelonephritis, acute 06/13/2014  . Sepsis ([place]) 06/13/2014  . History of kidney transplant   . Chronic pain syndrome 04/09/2012  . Dehydration, mild 04/09/2012  .  Gout attack 06/23/2011  . Herpes infection 06/23/2011  . Anxiety  06/23/2011  . E coli bacteremia 06/18/2011  . ESRD (end stage renal disease) ([place]) 06/12/2011  . UTI (lower urinary tract infection) 06/12/2011  . Bacteremia due to Gram-negative bacteria 05/23/2011  . History of renal transplantation 05/22/2011  . Septic shock(785.52) 05/22/2011  . Acute on chronic kidney failure ([place]) 05/22/2011  . Gastroparesis 04/24/2008  . WEIGHT LOSS 08/24/2007  . NAUSEA AND VOMITING 08/24/2007    Darrel Hoover  PT 06/01/2019, 5:04 PM  [facility] [address] [place], [place], [address] Phone: [phone]   Fax:  [phone]  Name: Tina Watkins MRN: [id number] Date of Birth: [birth year]

## 2019-06-01 NOTE — Patient Instructions (Signed)
Cervical rotation RT /LT    , shoulder elevation /retraction/protraction x  2-3 rpe 6-8 x/day brief hold

## 2019-06-07 ENCOUNTER — Ambulatory Visit: Payer: Medicaid Other | Admitting: Family Medicine

## 2019-06-07 ENCOUNTER — Other Ambulatory Visit: Payer: Self-pay

## 2019-06-07 ENCOUNTER — Encounter (HOSPITAL_COMMUNITY)
Admission: RE | Admit: 2019-06-07 | Discharge: 2019-06-07 | Disposition: A | Discharge status: Home / Self Care | Payer: Medicaid Other | Source: Ambulatory Visit | Attending: Nephrology | Admitting: Nephrology

## 2019-06-07 VITALS — BP 93/62 | HR 65 | Temp 96.4°F | Resp 18

## 2019-06-07 DIAGNOSIS — N185 Chronic kidney disease, stage 5: Secondary | ICD-10-CM | POA: Diagnosis present

## 2019-06-07 DIAGNOSIS — Z940 Kidney transplant status: Secondary | ICD-10-CM | POA: Insufficient documentation

## 2019-06-07 DIAGNOSIS — D631 Anemia in chronic kidney disease: Secondary | ICD-10-CM | POA: Insufficient documentation

## 2019-06-07 LAB — COMPREHENSIVE METABOLIC PANEL
ALT: 18 U/L (ref 0–44)
AST: 27 U/L (ref 15–41)
Albumin: 3.4 g/dL — ABNORMAL LOW (ref 3.5–5.0)
Alkaline Phosphatase: 140 U/L — ABNORMAL HIGH (ref 38–126)
Anion gap: 13 (ref 5–15)
BUN: 46 mg/dL — ABNORMAL HIGH (ref 6–20)
CO2: 21 mmol/L — ABNORMAL LOW (ref 22–32)
Calcium: 9 mg/dL (ref 8.9–10.3)
Chloride: 104 mmol/L (ref 98–111)
Creatinine, Ser: 3.49 mg/dL — ABNORMAL HIGH (ref 0.44–1.00)
GFR calc Af Amer: 17 mL/min — ABNORMAL LOW (ref >60)
GFR calc non Af Amer: 14 mL/min — ABNORMAL LOW (ref >60)
Glucose, Bld: 81 mg/dL (ref 70–99)
Potassium: 4.4 mmol/L (ref 3.5–5.1)
Sodium: 138 mmol/L (ref 135–145)
Total Bilirubin: 1.4 mg/dL — ABNORMAL HIGH (ref 0.3–1.2)
Total Protein: 6.1 g/dL — ABNORMAL LOW (ref 6.5–8.1)

## 2019-06-07 LAB — IRON AND TIBC
Iron: 143 ug/dL (ref 28–170)
Saturation Ratios: 83 % — ABNORMAL HIGH (ref 10.4–31.8)
TIBC: 172 ug/dL — ABNORMAL LOW (ref 250–450)
UIBC: 29 ug/dL

## 2019-06-07 LAB — POCT HEMOGLOBIN-HEMACUE: Hemoglobin: 8 g/dL — ABNORMAL LOW (ref 12.0–15.0)

## 2019-06-07 LAB — PHOSPHORUS: Phosphorus: 3.9 mg/dL (ref 2.5–4.6)

## 2019-06-07 LAB — FERRITIN: Ferritin: 1493 ng/mL — ABNORMAL HIGH (ref 11–307)

## 2019-06-07 MED ORDER — EPOETIN ALFA 20000 UNIT/ML IJ SOLN: 50000.0000 [IU] | INTRAMUSCULAR | Status: DC

## 2019-06-07 MED ORDER — EPOETIN ALFA 10000 UNIT/ML IJ SOLN
INTRAMUSCULAR | Status: AC
  Administered 2019-06-07: 10000 [IU]
  Filled 2019-06-07: qty 1

## 2019-06-07 MED ORDER — EPOETIN ALFA 40000 UNIT/ML IJ SOLN
INTRAMUSCULAR | Status: AC
  Administered 2019-06-07: 40000 [IU]
  Filled 2019-06-07: qty 1

## 2019-06-09 DIAGNOSIS — I509 Heart failure, unspecified: Secondary | ICD-10-CM | POA: Diagnosis not present

## 2019-06-09 DIAGNOSIS — U071 COVID-19: Secondary | ICD-10-CM | POA: Diagnosis not present

## 2019-06-09 DIAGNOSIS — K219 Gastro-esophageal reflux disease without esophagitis: Secondary | ICD-10-CM | POA: Diagnosis not present

## 2019-06-09 DIAGNOSIS — N183 Chronic kidney disease, stage 3 unspecified: Secondary | ICD-10-CM | POA: Diagnosis not present

## 2019-06-09 DIAGNOSIS — I130 Hypertensive heart and chronic kidney disease with heart failure and stage 1 through stage 4 chronic kidney disease, or unspecified chronic kidney disease: Secondary | ICD-10-CM | POA: Diagnosis not present

## 2019-06-09 DIAGNOSIS — D631 Anemia in chronic kidney disease: Secondary | ICD-10-CM | POA: Diagnosis not present

## 2019-06-09 DIAGNOSIS — Z9181 History of falling: Secondary | ICD-10-CM | POA: Diagnosis not present

## 2019-06-09 DIAGNOSIS — M5030 Other cervical disc degeneration, unspecified cervical region: Secondary | ICD-10-CM | POA: Diagnosis not present

## 2019-06-09 DIAGNOSIS — Z8673 Personal history of transient ischemic attack (TIA), and cerebral infarction without residual deficits: Secondary | ICD-10-CM | POA: Diagnosis not present

## 2019-06-09 DIAGNOSIS — I959 Hypotension, unspecified: Secondary | ICD-10-CM | POA: Diagnosis not present

## 2019-06-09 DIAGNOSIS — B009 Herpesviral infection, unspecified: Secondary | ICD-10-CM | POA: Diagnosis not present

## 2019-06-09 DIAGNOSIS — Z940 Kidney transplant status: Secondary | ICD-10-CM | POA: Diagnosis not present

## 2019-06-12 ENCOUNTER — Ambulatory Visit: Payer: Medicaid Other | Attending: Family Medicine

## 2019-06-12 ENCOUNTER — Other Ambulatory Visit: Payer: Self-pay

## 2019-06-12 DIAGNOSIS — K3184 Gastroparesis: Secondary | ICD-10-CM | POA: Diagnosis not present

## 2019-06-12 DIAGNOSIS — R293 Abnormal posture: Secondary | ICD-10-CM | POA: Insufficient documentation

## 2019-06-12 DIAGNOSIS — I959 Hypotension, unspecified: Secondary | ICD-10-CM | POA: Diagnosis not present

## 2019-06-12 DIAGNOSIS — Z9225 Personal history of immunosupression therapy: Secondary | ICD-10-CM | POA: Diagnosis not present

## 2019-06-12 DIAGNOSIS — N179 Acute kidney failure, unspecified: Secondary | ICD-10-CM | POA: Diagnosis not present

## 2019-06-12 DIAGNOSIS — R252 Cramp and spasm: Secondary | ICD-10-CM

## 2019-06-12 DIAGNOSIS — Z940 Kidney transplant status: Secondary | ICD-10-CM | POA: Diagnosis not present

## 2019-06-12 DIAGNOSIS — I5032 Chronic diastolic (congestive) heart failure: Secondary | ICD-10-CM | POA: Diagnosis not present

## 2019-06-12 DIAGNOSIS — N184 Chronic kidney disease, stage 4 (severe): Secondary | ICD-10-CM | POA: Diagnosis not present

## 2019-06-12 DIAGNOSIS — M542 Cervicalgia: Secondary | ICD-10-CM | POA: Insufficient documentation

## 2019-06-12 NOTE — Therapy (Addendum)
[place], [place], [address] Phone: [phone]   Fax:  [phone]  Physical Therapy Treatment/Discharge  Patient Details  Name: Tina Watkins MRN: [id number] Date of Birth: 11-02-1965 Referring Provider (PT): Kathe Becton, MD   Encounter Date: 06/12/2019  PT End of Session - 06/12/19 1311    Visit Number  2    Number of Visits  18    Date for PT Re-Evaluation  07/28/19    Authorization Type  MCD    PT Start Time  1150   Pt late   PT Stop Time  1232    PT Time Calculation (min)  42 min    Activity Tolerance  Patient tolerated treatment well    Behavior During Therapy  [facility] for tasks assessed/performed       Past Medical History:  Diagnosis Date  . Bacteremia due to Gram-negative bacteria 05/23/2011  . Blind    right eye  . Blind right eye   . CHF (congestive heart failure) ([place])   . Chronic lower back pain   . Complication of anesthesia    "woke up during OR; I have an extremely high tolerance" (12/11/2011) 1 procedure was graft; the other procedures were procedures that are typically done with sedation.  . DDD (degenerative disc disease), cervical   . Depression   . Dysrhythmia    "tachycardia" (12/11/2011) new onset afib 10/15/14 EKG  . E coli bacteremia 06/18/2011  . Elevated LDL cholesterol level 12/2018  . ESRD (end stage renal disease) ([place]) 06/12/2011  . Fibromyalgia   . Gastroparesis   . Gastroparesis   . GERD (gastroesophageal reflux disease)   . Glaucoma    right eye  . Gout   . H/O carpal tunnel syndrome   . Headache(784.0)    "not often anymore" (12/11/2011)  . Herpes genitalia 1994  . History of blood transfusion    "more than a few times" (12/11/2011)  . History of stomach ulcers   . Hypotension   . Iron deficiency anemia   . New onset a-fib ([place])    10/15/14 EKG  . Osteopenia   . Peripheral neuropathy 11/2018  . Seizures ([place]) 1994   "post transplant; only  have had that one" (12/11/2011)  . Spinal stenosis in cervical region   . Stroke [facility])     left basal ganglia lacunar infarct; Right frontal lobe lacunar infarct.  . Stroke [facility]) ~ 1999; 2001   "briefly lost my vision; lost my right eye" (12/11/2011)  . Vitamin D deficiency 12/2018    Past Surgical History:  Procedure Laterality Date  . ANTERIOR CERVICAL DECOMP/DISCECTOMY FUSION N/A 01/08/2015   Procedure: Anterior Cervical Three-Four/Four-Five Decompression/Diskectomy/Fusion;  Surgeon: Leeroy Cha, MD;  Location: [place] NEURO ORS;  Service: Neurosurgery;  Laterality: N/A;  C3-4 C4-5 Anterior cervical decompression/diskectomy/fusion  . APPENDECTOMY  ~ 2004  . CATARACT EXTRACTION     right eye  . COLONOSCOPY    . ENUCLEATION  2001   "right"  . ESOPHAGOGASTRODUODENOSCOPY (EGD) WITH PROPOFOL N/A 04/21/2012   Procedure: ESOPHAGOGASTRODUODENOSCOPY (EGD) WITH PROPOFOL;  Surgeon: Milus Banister, MD;  [facility];  Service: Endoscopy;  Laterality: N/A;  . 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 [place]; 1999; 2005   "right"  . MULTIPLE TOOTH EXTRACTIONS    . RIGHT HEART CATH N/A 03/23/2019   Procedure: RIGHT HEART CATH;  Surgeon: Jolaine Artist, MD;  Location: [place] CV LAB;  Service: Cardiovascular;  Laterality: N/A;  . TONSILLECTOMY    . TOTAL NEPHRECTOMY  1988?; 1994; 2005    There were no vitals filed for this visit.  Subjective Assessment - 06/12/19 1305    Subjective  Shoulder better with min pain.  neck ths same RT scapula is most painful    Pertinent History  A CDF,  kidney Sx,   , anemia.  ,  CHF with fliud around heart/    Limitations  Standing;Walking;Lifting    Pain Score  --   moderate   Pain Location  Neck   and upper back RT> LT   Pain Orientation  Right;Left    Pain Descriptors / Indicators  Constant;Aching    Pain Type  Chronic pain    Pain Onset  More than a month ago     Pain Frequency  Constant    Aggravating Factors   using arm , home tasks    Pain Relieving Factors  meds massage                       [facility] Adult PT Treatment/Exercise - 06/12/19 0001      Exercises   Exercises  Neck      Neck Exercises: Seated   Cervical Rotation  Right;Left;5 reps    Lateral Flexion  Right;Left;5 reps    Shoulder Rolls  Backwards;Forwards;5 reps    Other Seated Exercise  overhad reach 2x5       Modalities   Modalities  Moist Heat      Moist Heat Therapy   Number Minutes Moist Heat  10 Minutes    Moist Heat Location  Cervical      Manual Therapy   Manual Therapy  Joint mobilization;Soft tissue mobilization;Myofascial release    Joint Mobilization  PA Glides Gr 2-3 to upper Thoracic     Soft tissue mobilization  Bilateral nerck and shoulders    Myofascial Release  RT rhomboids               PT Short Term Goals - 06/01/19 1655      PT SHORT TERM GOAL #1   Title  pt to be I with inial HEP    Baseline  no program    Time  3    Period  Weeks    Status  New      PT SHORT TERM GOAL #2   Title  pt to verbalize / demo proper posture in various positions to prevent/ reduce neck/ shoulder pain     Baseline  unaware of posture    Time  4    Period  Weeks    Status  New      PT SHORT TERM GOAL #3   Title  Reduce upper trap/ levator scap tightness to reduce pain to </= 5/10 and promote cervical mobility for therapuetic progression    Baseline  6-8/10    Time  4    Period  Weeks    Status  New        PT Long Term Goals - 06/01/19 1656      PT LONG TERM GOAL #1   Title  improve RT shoulder AR'OM to = Lt to promote improvment with home tasks    Baseline  flexion   60 degrees              abduction 50 degrees  Time  4    Period  Weeks    Status  New      PT LONG TERM GOAL #2   Title  pt will report  sleep disturbance to improve 50%    Baseline  frequent wakening per night    Time  4    Period  Weeks    Status  New       PT LONG TERM GOAL #3   Title  Pt will have no more than min pain at most 3-4/10  with light housework and ADLs.      Baseline  reports 6-8/10 pain  with house work      PT [place] #4   Title  pt to be I with all HEP given as of last visit to maintain and promote current level of function    Baseline  indepndent with intial HEP    Time  8    Period  Weeks    Status  New      PT LONG TERM GOAL #5   Title  She will be able to walk without walker in home and [facility] out of home    Baseline  RW all walking except holfing to objects in home    Time  [place] - 06/12/19 1312    Clinical Impression Statement  Ms Wroe  was less sore and more loose post session She reported understanding of have chin lower with ROM of neck.  May add some band if soreness decreased    PT Treatment/Interventions  Passive range of motion;Manual techniques;Patient/family education;Therapeutic activities;Therapeutic exercise;Balance training;Moist Heat    PT Next Visit Plan  Cont ROM and manual . possible band exercises    PT Home Exercise Plan  cervical rotation,  shoulder elevation and retraction    Consulted and Agree with Plan of Care  Patient       Patient will benefit from skilled therapeutic intervention in order to improve the following deficits and impairments:  Pain, Postural dysfunction, Decreased strength, Decreased range of motion, Difficulty walking, Decreased activity tolerance, Decreased balance, Increased muscle spasms, Decreased endurance  Visit Diagnosis: Cervicalgia  Abnormal posture  Cramp and spasm     Problem List Patient Active Problem List   Diagnosis Date Noted  . Pneumonia due to COVID-19 virus 05/09/2019  . Hypotension 05/09/2019  . Anemia 05/09/2019  . CHF (congestive heart failure) ([place]) 05/09/2019  . Swollen abdomen 01/04/2019  . DDD (degenerative disc disease), cervical 12/06/2018  . Neuropathy 12/06/2018  .  Paresthesia 10/11/2018  . Neck pain 10/11/2018  . Tobacco abuse 11/20/2017  . Right leg swelling 11/20/2017  . CKD (chronic kidney disease), stage III 11/20/2017  . Right leg pain 11/20/2017  . Stroke (cerebrum) ([place]) 11/20/2017  . GERD (gastroesophageal reflux disease) 11/20/2017  . Acute venous embolism and thrombosis of deep vessels of proximal lower extremity, right ([place]) 11/20/2017  . 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 Thrush, oral   . AKI (acute kidney injury) ([place])   . Multifocal pneumonia 08/09/2017  . Chest pain 09/29/2015  . Cervical stenosis of spinal canal 01/08/2015  . Urinary tract infectious disease   . Muscle spasms of neck 06/15/2014  . Bleeding hemorrhoid 06/15/2014  . Anemia in chronic kidney disease 06/15/2014  . Acute on chronic  renal failure ([place]) 06/13/2014  . Pyelonephritis, acute 06/13/2014  . Sepsis ([place]) 06/13/2014  . History of kidney transplant   . Chronic pain syndrome 04/09/2012  . Dehydration, mild 04/09/2012  . Gout attack 06/23/2011  . Herpes infection 06/23/2011  . Anxiety 06/23/2011  . E coli bacteremia 06/18/2011  . ESRD (end stage renal disease) ([place]) 06/12/2011  . UTI (lower urinary tract infection) 06/12/2011  . Bacteremia due to Gram-negative bacteria 05/23/2011  . History of renal transplantation 05/22/2011  . Septic shock(785.52) 05/22/2011  . Acute on chronic kidney failure ([place]) 05/22/2011  . Gastroparesis 04/24/2008  . WEIGHT LOSS 08/24/2007  . NAUSEA AND VOMITING 08/24/2007    Darrel Hoover  PT 06/12/2019, 1:17 PM  [facility] [address] [place], [place], [address] Phone: [phone]   Fax:  [phone]  Name: Tina Watkins MRN: [id number] Date of Birth: [birth year]  PHYSICAL THERAPY DISCHARGE SUMMARY  Visits from Start of Care: 2  Current functional level related to goals / functional outcomes: She has  an insurance issue and I spoke to her that she needed to resolve this before continuing PT. She did not return .    Remaining deficits: Unknown   Education / Equipment: NA Plan: Patient agrees to discharge.  Patient goals were not met. Patient is being discharged due to not returning since the last visit.  ?????  Pearson Forster PT   08/14/19

## 2019-06-13 ENCOUNTER — Encounter: Payer: Self-pay | Admitting: Family Medicine

## 2019-06-13 ENCOUNTER — Ambulatory Visit (INDEPENDENT_AMBULATORY_CARE_PROVIDER_SITE_OTHER): Payer: Medicaid Other | Admitting: Family Medicine

## 2019-06-13 ENCOUNTER — Telehealth: Payer: Self-pay | Admitting: Physical Therapy

## 2019-06-13 VITALS — BP 82/56 | HR 65 | Temp 98.5°F | Ht 59.0 in

## 2019-06-13 DIAGNOSIS — Z09 Encounter for follow-up examination after completed treatment for conditions other than malignant neoplasm: Secondary | ICD-10-CM

## 2019-06-13 DIAGNOSIS — R112 Nausea with vomiting, unspecified: Secondary | ICD-10-CM | POA: Diagnosis not present

## 2019-06-13 DIAGNOSIS — Z8616 Personal history of COVID-19: Secondary | ICD-10-CM

## 2019-06-13 DIAGNOSIS — I959 Hypotension, unspecified: Secondary | ICD-10-CM

## 2019-06-13 DIAGNOSIS — I509 Heart failure, unspecified: Secondary | ICD-10-CM | POA: Diagnosis not present

## 2019-06-13 DIAGNOSIS — K3184 Gastroparesis: Secondary | ICD-10-CM

## 2019-06-13 DIAGNOSIS — N183 Chronic kidney disease, stage 3 unspecified: Secondary | ICD-10-CM

## 2019-06-13 DIAGNOSIS — R531 Weakness: Secondary | ICD-10-CM | POA: Insufficient documentation

## 2019-06-13 MED ORDER — ONDANSETRON 4 MG PO TBDP: 4.0000 mg | ORAL_TABLET | Freq: Three times a day (TID) | ORAL | 11 refills | Status: DC | PRN

## 2019-06-13 MED ORDER — FAMOTIDINE 20 MG PO TABS: 20.0000 mg | ORAL_TABLET | Freq: Two times a day (BID) | ORAL | 11 refills | Status: DC

## 2019-06-13 NOTE — Progress Notes (Signed)
Patient [facility] Follow Up   Subjective:  Patient ID: Tina Watkins, female    DOB: [birth year]  Age: 54 y.o. MRN: [id number]  CC:  Chief Complaint  Patient presents with  . Follow-up    CHF  . Referral    Home health     HPI Marilee Ditommaso Kiesel is a 54 year old female who presents for Follow Up today.   Past Medical History:  Diagnosis Date  . Bacteremia due to Gram-negative bacteria 05/23/2011  . Blind    right eye  . Blind right eye   . CHF (congestive heart failure) ([place])   . Chronic lower back pain   . Complication of anesthesia    "woke up during OR; I have an extremely high tolerance" (12/11/2011) 1 procedure was graft; the other procedures were procedures that are typically done with sedation.  . DDD (degenerative disc disease), cervical   . Depression   . Dysrhythmia    "tachycardia" (12/11/2011) new onset afib 10/15/14 EKG  . E coli bacteremia 06/18/2011  . Elevated LDL cholesterol level 12/2018  . ESRD (end stage renal disease) ([place]) 06/12/2011  . Fibromyalgia   . Gastroparesis   . Gastroparesis   . GERD (gastroesophageal reflux disease)   . Glaucoma    right eye  . Gout   . H/O carpal tunnel syndrome   . Headache(784.0)    "not often anymore" (12/11/2011)  . Herpes genitalia 1994  . History of blood transfusion    "more than a few times" (12/11/2011)  . History of stomach ulcers   . Hypotension   . Iron deficiency anemia   . New onset a-fib ([place])    10/15/14 EKG  . Osteopenia   . Peripheral neuropathy 11/2018  . Seizures ([place]) 1994   "post transplant; only have had that one" (12/11/2011)  . Spinal stenosis in cervical region   . Stroke [facility])     left basal ganglia lacunar infarct; Right frontal lobe lacunar infarct.  . Stroke [facility]) ~ 1999; 2001   "briefly lost my vision; lost my right eye" (12/11/2011)  . Vitamin D deficiency 12/2018   Patient Active Problem List   Diagnosis Date  Noted  . Pneumonia due to COVID-19 virus 05/09/2019  . Hypotension 05/09/2019  . Anemia 05/09/2019  . CHF (congestive heart failure) ([place]) 05/09/2019  . Swollen abdomen 01/04/2019  . DDD (degenerative disc disease), cervical 12/06/2018  . Neuropathy 12/06/2018  . Paresthesia 10/11/2018  . Neck pain 10/11/2018  . Tobacco abuse 11/20/2017  . Right leg swelling 11/20/2017  . CKD (chronic kidney disease), stage III 11/20/2017  . Right leg pain 11/20/2017  . Stroke (cerebrum) ([place]) 11/20/2017  . GERD (gastroesophageal reflux disease) 11/20/2017  . Acute venous embolism and thrombosis of deep vessels of proximal lower extremity, right ([place]) 11/20/2017  . 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 Thrush, oral   . AKI (acute kidney injury) ([place])   . Multifocal pneumonia 08/09/2017  . Chest pain 09/29/2015  . Cervical stenosis of spinal canal 01/08/2015  . Urinary tract infectious disease   . Muscle spasms of neck 06/15/2014  . Bleeding hemorrhoid 06/15/2014  . Anemia in chronic kidney disease 06/15/2014  . Acute on chronic renal failure ([place]) 06/13/2014  . Pyelonephritis, acute 06/13/2014  . Sepsis ([place]) 06/13/2014  . History of kidney transplant   . Chronic pain syndrome 04/09/2012  .  Dehydration, mild 04/09/2012  . Gout attack 06/23/2011  . Herpes infection 06/23/2011  . Anxiety 06/23/2011  . E coli bacteremia 06/18/2011  . ESRD (end stage renal disease) ([place]) 06/12/2011  . UTI (lower urinary tract infection) 06/12/2011  . Bacteremia due to Gram-negative bacteria 05/23/2011  . History of renal transplantation 05/22/2011  . Septic shock(785.52) 05/22/2011  . Acute on chronic kidney failure ([place]) 05/22/2011  . Gastroparesis 04/24/2008  . WEIGHT LOSS 08/24/2007  . NAUSEA AND VOMITING 08/24/2007   Current Status: Since her last office visit,  is doing well with no complaints. She has recently followed up with Nephrology and had all labs  drawn there. She is accompanied by her uncle today. she denies fevers, chills, fatigue, recent infections, weight loss, and night sweats. She has not had any headaches, visual changes, dizziness, and falls. No chest pain, heart palpitations, cough and shortness of breath reported. Denies GI problems such as nausea, vomiting, diarrhea, and constipation. She has no reports of blood in stools, dysuria and hematuria. No depression or anxiety, and denies suicidal ideations, homicidal ideations, or auditory hallucinations. She is taking all medications as prescribed. She denies pain today.   Past Surgical History:  Procedure Laterality Date  . ANTERIOR CERVICAL DECOMP/DISCECTOMY FUSION N/A 01/08/2015   Procedure: Anterior Cervical Three-Four/Four-Five Decompression/Diskectomy/Fusion;  Surgeon: Leeroy Cha, MD;  Location: [facility];  Service: Neurosurgery;  Laterality: N/A;  C3-4 C4-5 Anterior cervical decompression/diskectomy/fusion  . APPENDECTOMY  ~ 2004  . CATARACT EXTRACTION     right eye  . COLONOSCOPY    . ENUCLEATION  2001   "right"  . ESOPHAGOGASTRODUODENOSCOPY (EGD) WITH PROPOFOL N/A 04/21/2012   Procedure: ESOPHAGOGASTRODUODENOSCOPY (EGD) WITH PROPOFOL;  Surgeon: Milus Banister, MD;  Location: [facility];  Service: Endoscopy;  Laterality: N/A;  . 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 [place]; 1999; 2005   "right"  . MULTIPLE TOOTH EXTRACTIONS    . RIGHT HEART CATH N/A 03/23/2019   Procedure: RIGHT HEART CATH;  Surgeon: Jolaine Artist, MD;  Location: [facility];  Service: Cardiovascular;  Laterality: N/A;  . TONSILLECTOMY    . TOTAL NEPHRECTOMY  1988?; 1994; 2005    Family History  Problem Relation Age of Onset  . Glaucoma Mother   . Pancreatic cancer Father   . Multiple sclerosis Brother   . Hypertension Maternal Grandmother   . Breast cancer Neg Hx     Social History    Socioeconomic History  . Marital status: Single    Spouse name: Not on file  . Number of children: 0  . Years of education: some college  . Highest education level: Not on file  Occupational History  . Occupation: disabled    Fish farm manager: UNEMPLOYED  Tobacco Use  . Smoking status: Current Every Day Smoker    Packs/day: 0.20    Years: 28.00    Pack years: 5.60    Types: Cigarettes  . Smokeless tobacco: Never Used  Substance and Sexual Activity  . Alcohol use: Yes    Comment: rare  . Drug use: Yes    Types: Marijuana    Comment: occasional use  . Sexual activity: Not Currently  Other Topics Concern  . Not on file  Social History Narrative   Right-handed.   Four cups caffeine per day.   Lives alone.   Social Determinants of Radio broadcast assistant  Strain:   . Difficulty of Paying Living Expenses:   Food Insecurity:   . Worried About Charity fundraiser in the Last Year:   . Arboriculturist in the Last Year:   Transportation Needs:   . Film/video editor (Medical):   [name] Lack of Transportation (Non-Medical):   Physical Activity:   . Days of Exercise per Week:   . Minutes of Exercise per Session:   Stress:   . Feeling of Stress :   Social Connections:   . Frequency of Communication with Friends and Family:   . Frequency of Social Gatherings with Friends and Family:   . Attends Religious Services:   . Active Member of Clubs or Organizations:   . Attends Archivist Meetings:   [name] Marital Status:   Intimate Partner Violence:   . Fear of Current or Ex-Partner:   . Emotionally Abused:   [name] Physically Abused:   . Sexually Abused:     Outpatient Medications Prior to Visit  Medication Sig Dispense Refill  . aspirin EC 81 MG tablet Take 81 mg by mouth daily.     [name] azaTHIOprine (IMURAN) 50 MG tablet Take 100 mg by mouth daily.     . colchicine 0.6 MG tablet Take 0.6-1.2 mg by mouth See admin instructions. Take 1.2 mg by mouth at onset, then take 0.6 mg  three times a day as need for gout flare    . Darbepoetin Alfa (ARANESP, ALBUMIN FREE, IJ) Inject as directed every 14 (fourteen) days.    . fluticasone (FLONASE) 50 MCG/ACT nasal spray Place 1 spray into both nostrils daily as needed for allergies or rhinitis.    [name] gabapentin (NEURONTIN) 100 MG capsule Take 100 mg by mouth 2 (two) times daily.     [name] latanoprost (XALATAN) 0.005 % ophthalmic solution Place 1 drop into the left eye at bedtime.  3  . lidocaine (LIDODERM) 5 % Place 1 patch onto the skin daily as needed (for pain- remove old patch first).     . midodrine (PROAMATINE) 5 MG tablet Take 1 tablet (5 mg total) by mouth 2 (two) times daily with a meal. 60 tablet 0  . nystatin (MYCOSTATIN) 100000 UNIT/ML suspension Use as directed 5 mLs in the mouth or throat daily as needed (FOR THRUSH).     [name] oxyCODONE-acetaminophen (PERCOCET) 10-325 MG tablet Take 1 tablet by mouth See admin instructions. Take 1 tablet by mouth every four to six hours as needed for pain    . predniSONE (DELTASONE) 5 MG tablet Take 5 mg by mouth daily.    [name] PROVENTIL HFA 108 (90 Base) MCG/ACT inhaler Inhale 2 puffs into the lungs 4 (four) times daily as needed for shortness of breath.  2  . sodium bicarbonate 650 MG tablet Take 1 tablet (650 mg total) by mouth 2 (two) times daily. 60 tablet 0  . tacrolimus (PROGRAF) 1 MG capsule Take 2-3 mg by mouth See admin instructions. Take 3 mg by mouth in the morning and 2 mg at bedtime    . Vitamin D, Ergocalciferol, (DRISDOL) 1.25 MG (50000 UNIT) CAPS capsule Take 50,000 Units by mouth every Sunday.     . zolpidem (AMBIEN) 10 MG tablet Take 10 mg by mouth at bedtime as needed for sleep.    [name] ondansetron (ZOFRAN-ODT) 4 MG disintegrating tablet Take 4 mg by mouth every 8 (eight) hours as needed for nausea or vomiting.    . benzonatate (TESSALON) 100 MG capsule  Take 1 capsule (100 mg total) by mouth 3 (three) times daily as needed for cough. 20 capsule 0  . furosemide (LASIX) 40 MG  tablet Take 80 mg by mouth daily.    . metoCLOPramide (REGLAN) 5 MG tablet Take 1 tablet (5 mg total) by mouth 2 (two) times daily. (Patient not taking: Reported on 06/13/2019) 180 tablet 3  . omeprazole (PRILOSEC) 40 MG capsule Take 40 mg by mouth 2 (two) times daily.     No facility-administered medications prior to visit.    Allergies  Allergen Reactions  . Levofloxacin Itching and Rash  . Tape Other (See Comments)    "Certain surgical tapes peel off my skin"    ROS Review of Systems  Constitutional: Negative.   HENT: Negative.   Eyes: Negative.   Respiratory: Negative.   Cardiovascular: Negative.   Gastrointestinal: Negative.   Endocrine: Negative.   Genitourinary: Negative.   Musculoskeletal: Positive for arthralgias (generalized joint pain).  Skin: Negative.   Allergic/Immunologic: Negative.   Neurological: Positive for dizziness (occasional ), weakness (occasional) and headaches (occasional).  Hematological: Negative.   Psychiatric/Behavioral: Negative.       Objective:    Physical Exam  Constitutional: She is oriented to person, place, and time. She appears well-developed and well-nourished.  HENT:  Head: Normocephalic and atraumatic.  Eyes: Conjunctivae are normal.  Cardiovascular: Normal rate, regular rhythm, normal heart sounds and intact distal pulses.  Pulmonary/Chest: Effort normal and breath sounds normal.  Abdominal: Soft. Bowel sounds are normal.  Musculoskeletal:     Cervical back: Normal range of motion and neck supple.     Comments: Limited ROM Ambulates via wheelchair.   Neurological: She is alert and oriented to person, place, and time. She has normal reflexes.  Skin: Skin is warm and dry.  Psychiatric: She has a normal mood and affect. Her behavior is normal. Judgment and thought content normal.  Nursing note and vitals reviewed.   BP (!) 82/56   Pulse 65   Temp 98.5 F (36.9 C)   Ht 4\' 11"  (1.499 m)   SpO2 97%   BMI 26.27 kg/m  Wt  Readings from Last 3 Encounters:  05/17/19 130 lb 1.1 oz (59 kg)  05/08/19 115 lb 3.2 oz (52.3 kg)  04/19/19 119 lb 9.6 oz (54.3 kg)     Health Maintenance Due  Topic Date Due  . COVID-19 Vaccine (1) Never done  . TETANUS/TDAP  Never done  . PAP SMEAR-Modifier  Never done  . COLONOSCOPY  Never done    There are no preventive care reminders to display for this patient.  Lab Results  Component Value Date   TSH 0.878 12/06/2018   Lab Results  Component Value Date   WBC 3.2 (L) 05/18/2019   HGB 8.0 (L) 06/07/2019   HCT 25.5 (L) 05/18/2019   MCV 100.8 (H) 05/18/2019   PLT 149 (L) 05/18/2019   Lab Results  Component Value Date   NA 138 06/07/2019   K 4.4 06/07/2019   CO2 21 (L) 06/07/2019   GLUCOSE 81 06/07/2019   BUN 46 (H) 06/07/2019   CREATININE 3.49 (H) 06/07/2019   BILITOT 1.4 (H) 06/07/2019   ALKPHOS 140 (H) 06/07/2019   AST 27 06/07/2019   ALT 18 06/07/2019   PROT 6.1 (L) 06/07/2019   ALBUMIN 3.4 (L) 06/07/2019   CALCIUM 9.0 06/07/2019   ANIONGAP 13 06/07/2019   Lab Results  Component Value Date   CHOL 214 (H) 12/06/2018   Lab Results  Component Value Date   HDL 89 12/06/2018   Lab Results  Component Value Date   LDLCALC 105 (H) 12/06/2018   Lab Results  Component Value Date   TRIG 118 12/06/2018   Lab Results  Component Value Date   CHOLHDL 2.4 12/06/2018   Lab Results  Component Value Date   HGBA1C 4.8 05/08/2019      Assessment & Plan:   1. Hospital discharge follow-up  2. History of 2019 novel coronavirus disease (COVID-19) Resolved.  3. Congestive heart failure, unspecified HF chronicity, unspecified heart failure type ([place]) No signs or symptoms of respiratory distress noted or reported today.   4. Gastroparesis - Ambulatory referral to [facility] - ondansetron (ZOFRAN-ODT) 4 MG disintegrating tablet; Take 1 tablet (4 mg total) by mouth every 8 (eight) hours as needed for nausea or vomiting.  Dispense: 20 tablet; Refill: 11 -  famotidine (PEPCID) 20 MG tablet; Take 1 tablet (20 mg total) by mouth 2 (two) times daily.  Dispense: 60 tablet; Refill: 11  5. Non-intractable vomiting with nausea, unspecified vomiting type - Ambulatory referral to [facility] - ondansetron (ZOFRAN-ODT) 4 MG disintegrating tablet; Take 1 tablet (4 mg total) by mouth every 8 (eight) hours as needed for nausea or vomiting.  Dispense: 20 tablet; Refill: 11 - famotidine (PEPCID) 20 MG tablet; Take 1 tablet (20 mg total) by mouth 2 (two) times daily.  Dispense: 60 tablet; Refill: 11  6. Hypotension, unspecified hypotension type We will continue to monitor.   7. Stage 3 chronic kidney disease, unspecified whether stage 3a or 3b CKD Continue to follow up with Nephrology.   8. Weakness  9. Follow up She will follow up in 1 month.   Meds ordered this encounter  Medications  . ondansetron (ZOFRAN-ODT) 4 MG disintegrating tablet    Sig: Take 1 tablet (4 mg total) by mouth every 8 (eight) hours as needed for nausea or vomiting.    Dispense:  20 tablet    Refill:  11  . famotidine (PEPCID) 20 MG tablet    Sig: Take 1 tablet (20 mg total) by mouth 2 (two) times daily.    Dispense:  60 tablet    Refill:  11   Meds ordered this encounter  Medications  . ondansetron (ZOFRAN-ODT) 4 MG disintegrating tablet    Sig: Take 1 tablet (4 mg total) by mouth every 8 (eight) hours as needed for nausea or vomiting.    Dispense:  20 tablet    Refill:  11  . famotidine (PEPCID) 20 MG tablet    Sig: Take 1 tablet (20 mg total) by mouth 2 (two) times daily.    Dispense:  60 tablet    Refill:  11   Orders Placed This Encounter  Procedures  . Ambulatory referral to [facility]     Referral Orders     Ambulatory referral to [facility],  MSN, FNP-BC [facility] [address] [place], [place] [address] [phone] fax    Problem List Items  Addressed This Visit      Cardiovascular and Mediastinum   CHF (congestive heart failure) (HCC)   Hypotension     Digestive   Gastroparesis   Relevant Medications   ondansetron (ZOFRAN-ODT) 4 MG disintegrating tablet   famotidine (PEPCID) 20 MG tablet   Other Relevant Orders   Ambulatory referral to [facility]  Medications   ondansetron (ZOFRAN-ODT) 4 MG disintegrating tablet   famotidine (PEPCID) 20 MG tablet   Other Relevant Orders   Ambulatory referral to Home Health     Genitourinary   CKD (chronic kidney disease), stage III    Other Visit Diagnoses    Hospital discharge follow-up    -  Primary   History of 2019 novel coronavirus disease (COVID-19)       Weakness       Follow up          Meds ordered this encounter  Medications  . ondansetron (ZOFRAN-ODT) 4 MG disintegrating tablet    Sig: Take 1 tablet (4 mg total) by mouth every 8 (eight) hours as needed for nausea or vomiting.    Dispense:  20 tablet    Refill:  11  . famotidine (PEPCID) 20 MG tablet    Sig: Take 1 tablet (20 mg total) by mouth 2 (two) times daily.    Dispense:  60 tablet    Refill:  11    Follow-up: Return in about 1 month (around 07/14/2019).    [name], FNP

## 2019-06-13 NOTE — Telephone Encounter (Signed)
Spoke to Ms Tina Watkins and informed her that Medicaid would not pay for PT in our clinic unless she contacted them on her intent on coming to our clinic for Pt or having her [place] agency discharge her. She said she  will contact the provider and get them to discharge her. I informed her that 5/13/ visit will be canceled. She agreed.

## 2019-06-15 ENCOUNTER — Ambulatory Visit: Payer: Medicaid Other | Admitting: Physical Therapy

## 2019-06-19 ENCOUNTER — Ambulatory Visit: Payer: Medicaid Other

## 2019-06-20 DIAGNOSIS — L820 Inflamed seborrheic keratosis: Secondary | ICD-10-CM | POA: Diagnosis not present

## 2019-06-21 ENCOUNTER — Encounter (HOSPITAL_COMMUNITY): Payer: Medicaid Other

## 2019-06-21 ENCOUNTER — Encounter: Payer: Self-pay | Admitting: Family Medicine

## 2019-06-22 ENCOUNTER — Ambulatory Visit: Payer: Medicaid Other | Admitting: Physical Therapy

## 2019-06-23 ENCOUNTER — Other Ambulatory Visit: Payer: Self-pay

## 2019-06-23 ENCOUNTER — Encounter (HOSPITAL_COMMUNITY)
Admission: RE | Admit: 2019-06-23 | Discharge: 2019-06-23 | Disposition: A | Discharge status: Home / Self Care | Payer: Medicaid Other | Source: Ambulatory Visit | Attending: Nephrology | Admitting: Nephrology

## 2019-06-23 VITALS — BP 123/71 | HR 81 | Temp 97.8°F | Resp 20

## 2019-06-23 DIAGNOSIS — D631 Anemia in chronic kidney disease: Secondary | ICD-10-CM

## 2019-06-23 DIAGNOSIS — Z940 Kidney transplant status: Secondary | ICD-10-CM

## 2019-06-23 DIAGNOSIS — N185 Chronic kidney disease, stage 5: Secondary | ICD-10-CM

## 2019-06-23 LAB — POCT HEMOGLOBIN-HEMACUE: Hemoglobin: 7.5 g/dL — ABNORMAL LOW (ref 12.0–15.0)

## 2019-06-23 LAB — PREPARE RBC (CROSSMATCH)

## 2019-06-23 MED ORDER — SODIUM CHLORIDE 0.9% IV SOLUTION: Freq: Once | INTRAVENOUS | Status: DC

## 2019-06-23 MED ORDER — EPOETIN ALFA 10000 UNIT/ML IJ SOLN
INTRAMUSCULAR | Status: AC
  Filled 2019-06-23: qty 1

## 2019-06-23 MED ORDER — EPOETIN ALFA 10000 UNIT/ML IJ SOLN
50000.0000 [IU] | INTRAMUSCULAR | Status: DC
  Administered 2019-06-23: 50000 [IU] via SUBCUTANEOUS

## 2019-06-23 MED ORDER — EPOETIN ALFA 40000 UNIT/ML IJ SOLN
INTRAMUSCULAR | Status: AC
  Filled 2019-06-23: qty 1

## 2019-06-23 NOTE — Progress Notes (Signed)
Notified Amber with [facility] of HGb 7.5, patient denies any SOB/ blood. Patient states she is just tired.  Per [employer] Dr. Arty Baumgartner wants patient to get 1unit blood and regular dose of procrit (50,000u).

## 2019-06-24 LAB — TYPE AND SCREEN
ABO/RH(D): B POS
Antibody Screen: NEGATIVE
Unit division: 0

## 2019-06-24 LAB — BPAM RBC
Blood Product Expiration Date: 202105292359
ISSUE DATE / TIME: 202105211026
Unit Type and Rh: 5100

## 2019-06-26 MED FILL — Epoetin Alfa Inj 10000 Unit/ML: INTRAMUSCULAR | Qty: 1 | Status: AC

## 2019-06-26 MED FILL — Epoetin Alfa Inj 40000 Unit/ML: INTRAMUSCULAR | Qty: 1 | Status: AC

## 2019-06-28 DIAGNOSIS — E7800 Pure hypercholesterolemia, unspecified: Secondary | ICD-10-CM | POA: Diagnosis not present

## 2019-06-28 DIAGNOSIS — Z79899 Other long term (current) drug therapy: Secondary | ICD-10-CM | POA: Diagnosis not present

## 2019-06-28 DIAGNOSIS — Z1159 Encounter for screening for other viral diseases: Secondary | ICD-10-CM | POA: Diagnosis not present

## 2019-06-30 ENCOUNTER — Encounter (HOSPITAL_COMMUNITY): Payer: Medicaid Other

## 2019-07-04 DIAGNOSIS — L89151 Pressure ulcer of sacral region, stage 1: Secondary | ICD-10-CM

## 2019-07-04 HISTORY — DX: Pressure ulcer of sacral region, stage 1: L89.151

## 2019-07-07 ENCOUNTER — Observation Stay (HOSPITAL_COMMUNITY): Payer: Medicare Other

## 2019-07-07 ENCOUNTER — Other Ambulatory Visit (HOSPITAL_COMMUNITY): Payer: Medicaid Other

## 2019-07-07 ENCOUNTER — Encounter (HOSPITAL_COMMUNITY): Payer: Self-pay | Admitting: *Deleted

## 2019-07-07 ENCOUNTER — Other Ambulatory Visit: Payer: Self-pay

## 2019-07-07 ENCOUNTER — Inpatient Hospital Stay (HOSPITAL_COMMUNITY)
Admission: EM | Admit: 2019-07-07 | Discharge: 2019-07-17 | DRG: 673 | Disposition: A | Discharge status: Home / Self Care | Payer: Medicare Other | Source: Ambulatory Visit | Attending: Internal Medicine | Admitting: Internal Medicine

## 2019-07-07 ENCOUNTER — Emergency Department (HOSPITAL_COMMUNITY): Payer: Medicare Other

## 2019-07-07 ENCOUNTER — Encounter (HOSPITAL_COMMUNITY)
Admission: RE | Admit: 2019-07-07 | Discharge: 2019-07-07 | Disposition: A | Discharge status: Home / Self Care | Payer: Medicaid Other | Source: Ambulatory Visit | Attending: Nephrology | Admitting: Nephrology

## 2019-07-07 VITALS — BP 101/65 | HR 52 | Temp 95.6°F | Resp 18

## 2019-07-07 DIAGNOSIS — J90 Pleural effusion, not elsewhere classified: Secondary | ICD-10-CM | POA: Diagnosis not present

## 2019-07-07 DIAGNOSIS — D631 Anemia in chronic kidney disease: Secondary | ICD-10-CM | POA: Diagnosis present

## 2019-07-07 DIAGNOSIS — F17210 Nicotine dependence, cigarettes, uncomplicated: Secondary | ICD-10-CM | POA: Diagnosis present

## 2019-07-07 DIAGNOSIS — Z820 Family history of epilepsy and other diseases of the nervous system: Secondary | ICD-10-CM

## 2019-07-07 DIAGNOSIS — Z8673 Personal history of transient ischemic attack (TIA), and cerebral infarction without residual deficits: Secondary | ICD-10-CM

## 2019-07-07 DIAGNOSIS — M109 Gout, unspecified: Secondary | ICD-10-CM | POA: Diagnosis present

## 2019-07-07 DIAGNOSIS — D61818 Other pancytopenia: Secondary | ICD-10-CM | POA: Diagnosis not present

## 2019-07-07 DIAGNOSIS — D84821 Immunodeficiency due to drugs: Secondary | ICD-10-CM | POA: Diagnosis present

## 2019-07-07 DIAGNOSIS — I425 Other restrictive cardiomyopathy: Secondary | ICD-10-CM | POA: Diagnosis present

## 2019-07-07 DIAGNOSIS — N189 Chronic kidney disease, unspecified: Secondary | ICD-10-CM | POA: Diagnosis not present

## 2019-07-07 DIAGNOSIS — E875 Hyperkalemia: Secondary | ICD-10-CM | POA: Diagnosis not present

## 2019-07-07 DIAGNOSIS — M545 Low back pain: Secondary | ICD-10-CM | POA: Diagnosis present

## 2019-07-07 DIAGNOSIS — I10 Essential (primary) hypertension: Secondary | ICD-10-CM | POA: Diagnosis not present

## 2019-07-07 DIAGNOSIS — Z8616 Personal history of COVID-19: Secondary | ICD-10-CM

## 2019-07-07 DIAGNOSIS — N183 Chronic kidney disease, stage 3 unspecified: Secondary | ICD-10-CM

## 2019-07-07 DIAGNOSIS — Y830 Surgical operation with transplant of whole organ as the cause of abnormal reaction of the patient, or of later complication, without mention of misadventure at the time of the procedure: Secondary | ICD-10-CM | POA: Diagnosis present

## 2019-07-07 DIAGNOSIS — Z940 Kidney transplant status: Secondary | ICD-10-CM | POA: Diagnosis not present

## 2019-07-07 DIAGNOSIS — I959 Hypotension, unspecified: Secondary | ICD-10-CM | POA: Diagnosis not present

## 2019-07-07 DIAGNOSIS — Z7982 Long term (current) use of aspirin: Secondary | ICD-10-CM

## 2019-07-07 DIAGNOSIS — R52 Pain, unspecified: Secondary | ICD-10-CM

## 2019-07-07 DIAGNOSIS — Z8711 Personal history of peptic ulcer disease: Secondary | ICD-10-CM

## 2019-07-07 DIAGNOSIS — T8619 Other complication of kidney transplant: Principal | ICD-10-CM | POA: Diagnosis present

## 2019-07-07 DIAGNOSIS — Z8701 Personal history of pneumonia (recurrent): Secondary | ICD-10-CM

## 2019-07-07 DIAGNOSIS — Z992 Dependence on renal dialysis: Secondary | ICD-10-CM

## 2019-07-07 DIAGNOSIS — I132 Hypertensive heart and chronic kidney disease with heart failure and with stage 5 chronic kidney disease, or end stage renal disease: Secondary | ICD-10-CM | POA: Diagnosis not present

## 2019-07-07 DIAGNOSIS — I82211 Chronic embolism and thrombosis of superior vena cava: Secondary | ICD-10-CM | POA: Diagnosis present

## 2019-07-07 DIAGNOSIS — R5383 Other fatigue: Secondary | ICD-10-CM

## 2019-07-07 DIAGNOSIS — D638 Anemia in other chronic diseases classified elsewhere: Secondary | ICD-10-CM | POA: Diagnosis present

## 2019-07-07 DIAGNOSIS — I272 Pulmonary hypertension, unspecified: Secondary | ICD-10-CM | POA: Diagnosis present

## 2019-07-07 DIAGNOSIS — I9589 Other hypotension: Secondary | ICD-10-CM | POA: Diagnosis present

## 2019-07-07 DIAGNOSIS — D649 Anemia, unspecified: Secondary | ICD-10-CM

## 2019-07-07 DIAGNOSIS — M25571 Pain in right ankle and joints of right foot: Secondary | ICD-10-CM | POA: Diagnosis not present

## 2019-07-07 DIAGNOSIS — G8929 Other chronic pain: Secondary | ICD-10-CM | POA: Diagnosis present

## 2019-07-07 DIAGNOSIS — Z800 Family history of malignant neoplasm of digestive organs: Secondary | ICD-10-CM

## 2019-07-07 DIAGNOSIS — R531 Weakness: Secondary | ICD-10-CM | POA: Diagnosis not present

## 2019-07-07 DIAGNOSIS — Z8249 Family history of ischemic heart disease and other diseases of the circulatory system: Secondary | ICD-10-CM

## 2019-07-07 DIAGNOSIS — N185 Chronic kidney disease, stage 5: Secondary | ICD-10-CM

## 2019-07-07 DIAGNOSIS — N171 Acute kidney failure with acute cortical necrosis: Secondary | ICD-10-CM

## 2019-07-07 DIAGNOSIS — Z981 Arthrodesis status: Secondary | ICD-10-CM

## 2019-07-07 DIAGNOSIS — F329 Major depressive disorder, single episode, unspecified: Secondary | ICD-10-CM | POA: Diagnosis present

## 2019-07-07 DIAGNOSIS — N186 End stage renal disease: Secondary | ICD-10-CM | POA: Diagnosis present

## 2019-07-07 DIAGNOSIS — E8779 Other fluid overload: Secondary | ICD-10-CM | POA: Diagnosis present

## 2019-07-07 DIAGNOSIS — N2581 Secondary hyperparathyroidism of renal origin: Secondary | ICD-10-CM | POA: Diagnosis present

## 2019-07-07 DIAGNOSIS — I5032 Chronic diastolic (congestive) heart failure: Secondary | ICD-10-CM | POA: Diagnosis present

## 2019-07-07 DIAGNOSIS — M797 Fibromyalgia: Secondary | ICD-10-CM | POA: Diagnosis present

## 2019-07-07 DIAGNOSIS — E8770 Fluid overload, unspecified: Secondary | ICD-10-CM | POA: Diagnosis not present

## 2019-07-07 DIAGNOSIS — K219 Gastro-esophageal reflux disease without esophagitis: Secondary | ICD-10-CM | POA: Diagnosis present

## 2019-07-07 DIAGNOSIS — N179 Acute kidney failure, unspecified: Secondary | ICD-10-CM | POA: Diagnosis not present

## 2019-07-07 DIAGNOSIS — Z4901 Encounter for fitting and adjustment of extracorporeal dialysis catheter: Secondary | ICD-10-CM | POA: Diagnosis not present

## 2019-07-07 DIAGNOSIS — Z79899 Other long term (current) drug therapy: Secondary | ICD-10-CM

## 2019-07-07 DIAGNOSIS — I5033 Acute on chronic diastolic (congestive) heart failure: Secondary | ICD-10-CM | POA: Diagnosis not present

## 2019-07-07 DIAGNOSIS — H5461 Unqualified visual loss, right eye, normal vision left eye: Secondary | ICD-10-CM | POA: Diagnosis present

## 2019-07-07 DIAGNOSIS — D509 Iron deficiency anemia, unspecified: Secondary | ICD-10-CM | POA: Diagnosis present

## 2019-07-07 DIAGNOSIS — Z7952 Long term (current) use of systemic steroids: Secondary | ICD-10-CM

## 2019-07-07 DIAGNOSIS — N184 Chronic kidney disease, stage 4 (severe): Secondary | ICD-10-CM | POA: Diagnosis not present

## 2019-07-07 DIAGNOSIS — H409 Unspecified glaucoma: Secondary | ICD-10-CM | POA: Diagnosis present

## 2019-07-07 DIAGNOSIS — Z905 Acquired absence of kidney: Secondary | ICD-10-CM

## 2019-07-07 LAB — CBC WITH DIFFERENTIAL/PLATELET
Abs Immature Granulocytes: 0.02 10*3/uL (ref 0.00–0.07)
Basophils Absolute: 0 10*3/uL (ref 0.0–0.1)
Basophils Relative: 0 %
Eosinophils Absolute: 0.1 10*3/uL (ref 0.0–0.5)
Eosinophils Relative: 4 %
HCT: 24 % — ABNORMAL LOW (ref 36.0–46.0)
Hemoglobin: 7.5 g/dL — ABNORMAL LOW (ref 12.0–15.0)
Immature Granulocytes: 1 %
Lymphocytes Relative: 22 %
Lymphs Abs: 0.4 10*3/uL — ABNORMAL LOW (ref 0.7–4.0)
MCH: 34.4 pg — ABNORMAL HIGH (ref 26.0–34.0)
MCHC: 31.3 g/dL (ref 30.0–36.0)
MCV: 110.1 fL — ABNORMAL HIGH (ref 80.0–100.0)
Monocytes Absolute: 0.1 10*3/uL (ref 0.1–1.0)
Monocytes Relative: 4 %
Neutro Abs: 1.3 10*3/uL — ABNORMAL LOW (ref 1.7–7.7)
Neutrophils Relative %: 69 %
Platelets: 146 10*3/uL — ABNORMAL LOW (ref 150–400)
RBC: 2.18 MIL/uL — ABNORMAL LOW (ref 3.87–5.11)
RDW: 24 % — ABNORMAL HIGH (ref 11.5–15.5)
WBC: 1.9 10*3/uL — ABNORMAL LOW (ref 4.0–10.5)
nRBC: 1.6 % — ABNORMAL HIGH (ref 0.0–0.2)

## 2019-07-07 LAB — URINALYSIS, ROUTINE W REFLEX MICROSCOPIC
Bilirubin Urine: NEGATIVE
Glucose, UA: NEGATIVE mg/dL
Hgb urine dipstick: NEGATIVE
Ketones, ur: NEGATIVE mg/dL
Leukocytes,Ua: NEGATIVE
Nitrite: NEGATIVE
Protein, ur: NEGATIVE mg/dL
Specific Gravity, Urine: 1.01 (ref 1.005–1.030)
pH: 5 (ref 5.0–8.0)

## 2019-07-07 LAB — COMPREHENSIVE METABOLIC PANEL
ALT: 14 U/L (ref 0–44)
AST: 30 U/L (ref 15–41)
Albumin: 3.5 g/dL (ref 3.5–5.0)
Alkaline Phosphatase: 125 U/L (ref 38–126)
Anion gap: 12 (ref 5–15)
BUN: 80 mg/dL — ABNORMAL HIGH (ref 6–20)
CO2: 20 mmol/L — ABNORMAL LOW (ref 22–32)
Calcium: 9.1 mg/dL (ref 8.9–10.3)
Chloride: 105 mmol/L (ref 98–111)
Creatinine, Ser: 5.82 mg/dL — ABNORMAL HIGH (ref 0.44–1.00)
GFR calc Af Amer: 9 mL/min — ABNORMAL LOW (ref >60)
GFR calc non Af Amer: 8 mL/min — ABNORMAL LOW (ref >60)
Glucose, Bld: 84 mg/dL (ref 70–99)
Potassium: 4.6 mmol/L (ref 3.5–5.1)
Sodium: 137 mmol/L (ref 135–145)
Total Bilirubin: 1.4 mg/dL — ABNORMAL HIGH (ref 0.3–1.2)
Total Protein: 5.9 g/dL — ABNORMAL LOW (ref 6.5–8.1)

## 2019-07-07 LAB — LIPASE, BLOOD: Lipase: 44 U/L (ref 11–51)

## 2019-07-07 LAB — BRAIN NATRIURETIC PEPTIDE: B Natriuretic Peptide: 474.1 pg/mL — ABNORMAL HIGH (ref 0.0–100.0)

## 2019-07-07 LAB — PREPARE RBC (CROSSMATCH)

## 2019-07-07 LAB — POCT HEMOGLOBIN-HEMACUE: Hemoglobin: 7.7 g/dL — ABNORMAL LOW (ref 12.0–15.0)

## 2019-07-07 MED ORDER — GABAPENTIN 100 MG PO CAPS
100.0000 mg | ORAL_CAPSULE | Freq: Two times a day (BID) | ORAL | Status: DC
  Administered 2019-07-07 – 2019-07-17 (×20): 100 mg via ORAL
  Filled 2019-07-07 (×20): qty 1

## 2019-07-07 MED ORDER — EPOETIN ALFA 40000 UNIT/ML IJ SOLN
INTRAMUSCULAR | Status: AC
  Administered 2019-07-07: 40000 [IU]
  Filled 2019-07-07: qty 1

## 2019-07-07 MED ORDER — MIDODRINE HCL 2.5 MG PO TABS
2.5000 mg | ORAL_TABLET | Freq: Two times a day (BID) | ORAL | Status: DC
  Filled 2019-07-07 (×2): qty 1

## 2019-07-07 MED ORDER — SODIUM CHLORIDE 0.9 % IV SOLN
10.0000 mL/h | Freq: Once | INTRAVENOUS | Status: AC
  Administered 2019-07-07: 10 mL/h via INTRAVENOUS

## 2019-07-07 MED ORDER — DIPHENHYDRAMINE HCL 25 MG PO CAPS
25.0000 mg | ORAL_CAPSULE | Freq: Once | ORAL | Status: AC
  Administered 2019-07-07: 25 mg via ORAL
  Filled 2019-07-07: qty 1

## 2019-07-07 MED ORDER — PREDNISONE 5 MG PO TABS
5.0000 mg | ORAL_TABLET | Freq: Every day | ORAL | Status: DC
  Administered 2019-07-08 – 2019-07-12 (×5): 5 mg via ORAL
  Filled 2019-07-07 (×5): qty 1

## 2019-07-07 MED ORDER — ZOLPIDEM TARTRATE 5 MG PO TABS
5.0000 mg | ORAL_TABLET | Freq: Every evening | ORAL | Status: DC | PRN
  Administered 2019-07-09: 5 mg via ORAL
  Filled 2019-07-07: qty 1

## 2019-07-07 MED ORDER — FLUTICASONE PROPIONATE 50 MCG/ACT NA SUSP
1.0000 | Freq: Every day | NASAL | Status: DC | PRN
  Filled 2019-07-07: qty 16

## 2019-07-07 MED ORDER — OXYCODONE-ACETAMINOPHEN 10-325 MG PO TABS: 1.0000 | ORAL_TABLET | ORAL | Status: DC

## 2019-07-07 MED ORDER — SODIUM CHLORIDE 0.9 % IV BOLUS: 500.0000 mL | Freq: Once | INTRAVENOUS | Status: DC

## 2019-07-07 MED ORDER — EPOETIN ALFA 20000 UNIT/ML IJ SOLN: 50000.0000 [IU] | INTRAMUSCULAR | Status: DC

## 2019-07-07 MED ORDER — AZATHIOPRINE 50 MG PO TABS
100.0000 mg | ORAL_TABLET | Freq: Every day | ORAL | Status: DC
  Filled 2019-07-07: qty 2

## 2019-07-07 MED ORDER — LIDOCAINE 5 % EX PTCH
1.0000 | MEDICATED_PATCH | Freq: Every day | CUTANEOUS | Status: DC | PRN
  Administered 2019-07-08 – 2019-07-09 (×2): 1 via TRANSDERMAL
  Filled 2019-07-07 (×2): qty 1

## 2019-07-07 MED ORDER — FAMOTIDINE 20 MG PO TABS
20.0000 mg | ORAL_TABLET | Freq: Every day | ORAL | Status: DC
  Administered 2019-07-07 – 2019-07-16 (×10): 20 mg via ORAL
  Filled 2019-07-07 (×10): qty 1

## 2019-07-07 MED ORDER — PREDNISONE 20 MG PO TABS: 10.0000 mg | ORAL_TABLET | Freq: Every day | ORAL | Status: DC

## 2019-07-07 MED ORDER — TACROLIMUS 1 MG PO CAPS
3.0000 mg | ORAL_CAPSULE | Freq: Every morning | ORAL | Status: DC
  Administered 2019-07-08 – 2019-07-17 (×10): 3 mg via ORAL
  Filled 2019-07-07 (×7): qty 3
  Filled 2019-07-07: qty 1
  Filled 2019-07-07 (×3): qty 3

## 2019-07-07 MED ORDER — MORPHINE SULFATE (PF) 4 MG/ML IV SOLN
4.0000 mg | Freq: Once | INTRAVENOUS | Status: AC
  Administered 2019-07-07: 4 mg via INTRAVENOUS
  Filled 2019-07-07: qty 1

## 2019-07-07 MED ORDER — FUROSEMIDE 10 MG/ML IJ SOLN
80.0000 mg | Freq: Two times a day (BID) | INTRAMUSCULAR | Status: DC
  Administered 2019-07-08 – 2019-07-09 (×3): 80 mg via INTRAVENOUS
  Filled 2019-07-07 (×3): qty 8

## 2019-07-07 MED ORDER — MIDODRINE HCL 5 MG PO TABS
5.0000 mg | ORAL_TABLET | Freq: Two times a day (BID) | ORAL | Status: DC
  Administered 2019-07-08 – 2019-07-11 (×5): 5 mg via ORAL
  Filled 2019-07-07 (×7): qty 1

## 2019-07-07 MED ORDER — LATANOPROST 0.005 % OP SOLN
1.0000 [drp] | Freq: Every day | OPHTHALMIC | Status: DC
  Administered 2019-07-07 – 2019-07-16 (×10): 1 [drp] via OPHTHALMIC
  Filled 2019-07-07 (×2): qty 2.5

## 2019-07-07 MED ORDER — HEPARIN SODIUM (PORCINE) 5000 UNIT/ML IJ SOLN
5000.0000 [IU] | Freq: Three times a day (TID) | INTRAMUSCULAR | Status: DC
  Administered 2019-07-07 – 2019-07-11 (×11): 5000 [IU] via SUBCUTANEOUS
  Filled 2019-07-07 (×11): qty 1

## 2019-07-07 MED ORDER — SODIUM BICARBONATE 650 MG PO TABS: 650.0000 mg | ORAL_TABLET | Freq: Two times a day (BID) | ORAL | Status: DC

## 2019-07-07 MED ORDER — OXYCODONE-ACETAMINOPHEN 5-325 MG PO TABS
2.0000 | ORAL_TABLET | Freq: Once | ORAL | Status: AC
  Administered 2019-07-07: 2 via ORAL
  Filled 2019-07-07: qty 2

## 2019-07-07 MED ORDER — OXYCODONE HCL 5 MG PO TABS
5.0000 mg | ORAL_TABLET | Freq: Four times a day (QID) | ORAL | Status: DC | PRN
  Administered 2019-07-07 – 2019-07-08 (×2): 5 mg via ORAL
  Filled 2019-07-07 (×2): qty 1

## 2019-07-07 MED ORDER — OXYCODONE-ACETAMINOPHEN 5-325 MG PO TABS
1.0000 | ORAL_TABLET | Freq: Four times a day (QID) | ORAL | Status: DC | PRN
  Administered 2019-07-07 – 2019-07-08 (×2): 1 via ORAL
  Filled 2019-07-07 (×3): qty 1

## 2019-07-07 MED ORDER — EPOETIN ALFA 10000 UNIT/ML IJ SOLN
INTRAMUSCULAR | Status: AC
  Administered 2019-07-07: 10000 [IU]
  Filled 2019-07-07: qty 1

## 2019-07-07 MED ORDER — ASPIRIN EC 81 MG PO TBEC
81.0000 mg | DELAYED_RELEASE_TABLET | Freq: Every day | ORAL | Status: DC
  Administered 2019-07-08 – 2019-07-17 (×9): 81 mg via ORAL
  Filled 2019-07-07 (×10): qty 1

## 2019-07-07 MED ORDER — VITAMIN D (ERGOCALCIFEROL) 1.25 MG (50000 UNIT) PO CAPS
50000.0000 [IU] | ORAL_CAPSULE | ORAL | Status: DC
  Administered 2019-07-09 – 2019-07-16 (×2): 50000 [IU] via ORAL
  Filled 2019-07-07 (×2): qty 1

## 2019-07-07 MED ORDER — TACROLIMUS 1 MG PO CAPS
2.0000 mg | ORAL_CAPSULE | Freq: Every day | ORAL | Status: DC
  Administered 2019-07-07 – 2019-07-16 (×10): 2 mg via ORAL
  Filled 2019-07-07 (×12): qty 2

## 2019-07-07 MED ORDER — ALBUTEROL SULFATE (2.5 MG/3ML) 0.083% IN NEBU
2.5000 mg | INHALATION_SOLUTION | Freq: Four times a day (QID) | RESPIRATORY_TRACT | Status: DC | PRN
  Administered 2019-07-09: 2.5 mg via RESPIRATORY_TRACT
  Filled 2019-07-07: qty 3

## 2019-07-07 NOTE — ED Notes (Signed)
Pt given ginger ale & chicken salad with crackers per request

## 2019-07-07 NOTE — ED Triage Notes (Signed)
Pt reports going to medical day for infusion that she gets every 2 weeks. Was told she had to come here due to critcal low hematocrit. Only complaint is fatigue and weakness that has been ongoing. Hypotensive at triage but reports hx of same.

## 2019-07-07 NOTE — ED Notes (Signed)
Attempt to call report x 1  

## 2019-07-07 NOTE — Consult Note (Addendum)
[facility] Consultation Note  Requesting MD: Dr Wynetta Fines and ER  Reason for consult: AKI on CKD  HPI:  Tina [facility] is a 54 y.o. female with history of ESRD status post kidney transplant, CKD 4 followed by Dr. Marval Regal, recent prolonged hospitalization for Covid pneumonia when she developed AKI on CKD, CHF, TIA, presented with generalized weakness, decreased oral intake and worsening anemia, seen as a consultation for AKI on CKD. 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  She had 3 kidney transplant, the last one was from her uncle performed in [place] on August 13, 2003.  She had elevated PRA, required plasmapheresis prior to transplantation and has been on Prograf, Imuran and prednisone.  She has had multiple recent hospitalization with CHF exacerbation, gastroparesis, recent one in 05/2019 for COVID-19 infection causing AKI on CKD with peaked creatinine level of 6.5.  Subsequently her creatinine level improved to 3.11 on discharge from the hospital.  She was seen by PA in at [place] [facility] office. Today she came to [facility] short stay to receive Epogen.  When the lab checked the hemoglobin came back 7.5.  As patient was complaining of worsening weakness fatigue and low hemoglobin she was directed to the ER for further evaluation. In the ER, blood pressure initially was low around 99/55 and then gradually improved.  The lab came back worsening creatinine level of 5.82, BUN 80, potassium 4.6, BNP 474.  She has pancytopenia. Patient reported that she was not feeling well since she was discharged from the hospital.  Recently she was too weak to ambulate.  Noticed gaining significant weight associated with upper and lower extremity edema, abdomen distention.  She had a poor oral intake and felt nauseated.  Denies any urinary complaint.  Reportedly on transplant medication without any issues.  No use of NSAIDs. She is on Prograf 3 mg in  the morning and 2 pills at night, Imuran 100 mg and prednisone 5 mg. Recent US renal transplant with Doppler showed no evidence of hydronephrosis, patent renal artery and vein and dilated with normal resistive indices.  Creatinine, Ser  Date/Time Value Ref Range Status  07/07/2019 10:33 AM 5.82 (H) 0.44 - 1.00 mg/dL Final  06/07/2019 12:42 PM 3.49 (H) 0.44 - 1.00 mg/dL Final  05/18/2019 02:49 AM 3.11 (H) 0.44 - 1.00 mg/dL Final  05/17/2019 02:35 AM 3.53 (H) 0.44 - 1.00 mg/dL Final  05/16/2019 03:10 AM 3.97 (H) 0.44 - 1.00 mg/dL Final  05/15/2019 03:27 AM 5.26 (H) 0.44 - 1.00 mg/dL Final  05/14/2019 02:58 AM 6.25 (H) 0.44 - 1.00 mg/dL Final  05/13/2019 08:39 PM 6.51 (H) 0.44 - 1.00 mg/dL Final   PMHx:   Past Medical History:  Diagnosis Date  . Bacteremia due to Gram-negative bacteria 05/23/2011  . Blind    right eye  . Blind right eye   . CHF (congestive heart failure) ([place])   . Chronic lower back pain   . Complication of anesthesia    "woke up during OR; I have an extremely high tolerance" (12/11/2011) 1 procedure was graft; the other procedures were procedures that are typically done with sedation.  . DDD (degenerative disc disease), cervical   . Depression   . Dysrhythmia    "tachycardia" (12/11/2011) new onset afib 10/15/14 EKG  . E coli bacteremia 06/18/2011  . Elevated LDL cholesterol level 12/2018  . ESRD (end stage renal disease) ([place]) 06/12/2011  . Fibromyalgia   . Gastroparesis   . Gastroparesis   .  GERD (gastroesophageal reflux disease)   . Glaucoma    right eye  . Gout   . H/O carpal tunnel syndrome   . Headache(784.0)    "not often anymore" (12/11/2011)  . Herpes genitalia 1994  . History of blood transfusion    "more than a few times" (12/11/2011)  . History of stomach ulcers   . Hypotension   . Iron deficiency anemia   . New onset a-fib ([place])    10/15/14 EKG  . Osteopenia   . Peripheral neuropathy 11/2018  . Seizures ([place]) 1994   "post transplant; only have  had that one" (12/11/2011)  . Spinal stenosis in cervical region   . Stroke [facility])     left basal ganglia lacunar infarct; Right frontal lobe lacunar infarct.  . Stroke [facility]) ~ 1999; 2001   "briefly lost my vision; lost my right eye" (12/11/2011)  . Vitamin D deficiency 12/2018    Past Surgical History:  Procedure Laterality Date  . ANTERIOR CERVICAL DECOMP/DISCECTOMY FUSION N/A 01/08/2015   Procedure: Anterior Cervical Three-Four/Four-Five Decompression/Diskectomy/Fusion;  Surgeon: Leeroy Cha, MD;  Location: [place] [facility] OR[facility];  Service: Neurosurgery;  Laterality: N/A;  C3-4 C4-5 Anterior cervical decompression/diskectomy/fusion  . APPENDECTOMY  ~ 2004  . CATARACT EXTRACTION     right eye  . COLONOSCOPY    . ENUCLEATION  2001   "right"  . ESOPHAGOGASTRODUODENOSCOPY (EGD) WITH PROPOFOL N/A 04/21/2012   Procedure: ESOPHAGOGASTRODUODENOSCOPY (EGD) WITH PROPOFOL;  Surgeon: Milus Banister, MD;  Location: [facility] ENDOSCOPY;  Service: Endoscopy;  Laterality: N/A;  . 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 [place]; 1999; 2005   "right"  . MULTIPLE TOOTH EXTRACTIONS    . RIGHT HEART CATH N/A 03/23/2019   Procedure: RIGHT HEART CATH;  Surgeon: Jolaine Artist, MD;  Location: [place] CV LAB;  Service: Cardiovascular;  Laterality: N/A;  . TONSILLECTOMY    . TOTAL NEPHRECTOMY  1988?; 1994; 2005    Family Hx:  Family History  Problem Relation Age of Onset  . Glaucoma Mother   . Pancreatic cancer Father   . Multiple sclerosis Brother   . Hypertension Maternal Grandmother   . Breast cancer Neg Hx     Social History:  reports that she has been smoking cigarettes. She has a 5.60 pack-year smoking history. She has never used smokeless tobacco. She reports current alcohol use. She reports current drug use. Drug: Marijuana.  Allergies:  Allergies  Allergen Reactions  . Levofloxacin Itching and Rash  . Tape Other  (See Comments)    "Certain surgical tapes peel off my skin"    Medications: Prior to Admission medications   Medication Sig Start Date End Date Taking? Authorizing Provider  aspirin EC 81 MG tablet Take 81 mg by mouth daily.    Yes [provider]  azaTHIOprine (IMURAN) 50 MG tablet Take 100 mg by mouth daily.    Yes [provider]  colchicine 0.6 MG tablet Take 0.6-1.2 mg by mouth See admin instructions. Take 1.2 mg by mouth at onset, then take 0.6 mg three times a day as need for gout flare   Yes [provider]  Darbepoetin Alfa (ARANESP, ALBUMIN FREE, IJ) Inject as directed every 14 (fourteen) days.   Yes [provider]  famotidine (PEPCID) 20 MG tablet Take 1 tablet (20 mg total) by mouth 2 (two) times daily. Patient taking differently: Take 20  mg by mouth as needed for heartburn.  06/13/19  Yes [name], FNP  fluticasone (FLONASE) 50 MCG/ACT nasal spray Place 1 spray into both nostrils daily as needed for allergies or rhinitis.   Yes [provider]  furosemide (LASIX) 80 MG tablet Take 80 mg by mouth daily. 06/25/19  Yes [provider]  gabapentin (NEURONTIN) 100 MG capsule Take 100 mg by mouth 2 (two) times daily.  09/27/18  Yes [provider]  latanoprost (XALATAN) 0.005 % ophthalmic solution Place 1 drop into the left eye at bedtime. 03/30/17  Yes [provider]  lidocaine (LIDODERM) 5 % Place 1 patch onto the skin daily as needed (for pain- remove old patch first).  10/28/18  Yes [provider]  midodrine (PROAMATINE) 5 MG tablet Take 1 tablet (5 mg total) by mouth 2 (two) times daily with a meal. 05/18/19  Yes Ghimire, Henreitta Leber, MD  nystatin (MYCOSTATIN) 100000 UNIT/ML suspension Use as directed 5 mLs in the mouth or throat daily as needed (FOR THRUSH).    Yes [provider]  ondansetron (ZOFRAN-ODT) 4 MG disintegrating tablet Take 1 tablet (4 mg total) by mouth every 8 (eight) hours  as needed for nausea or vomiting. 06/13/19  Yes [name], FNP  oxyCODONE-acetaminophen (PERCOCET) 10-325 MG tablet Take 1 tablet by mouth See admin instructions. Take 1 tablet by mouth every four to six hours as needed for pain   Yes [provider]  predniSONE (DELTASONE) 5 MG tablet Take 5 mg by mouth daily. 06/22/11  Yes [name], MD  PROVENTIL HFA 108 [phone] Base) MCG/ACT inhaler Inhale 2 puffs into the lungs 4 (four) times daily as needed for shortness of breath. 08/04/17  Yes [provider]  tacrolimus (PROGRAF) 1 MG capsule Take 2-3 mg by mouth See admin instructions. Take 3 mg by mouth in the morning and 2 mg at bedtime 06/22/11  Yes [name], MD  Vitamin D, Ergocalciferol, (DRISDOL) 1.25 MG (50000 UNIT) CAPS capsule Take 50,000 Units by mouth every Sunday.    Yes [provider]  zolpidem (AMBIEN) 10 MG tablet Take 10 mg by mouth at bedtime as needed for sleep.   Yes [provider]  sodium bicarbonate 650 MG tablet Take 1 tablet (650 mg total) by mouth 2 (two) times daily. Patient not taking: Reported on 07/07/2019 05/18/19   Jonetta Osgood, MD    I have reviewed the patient's current medications.  Labs:  Results for orders placed or performed during the hospital encounter of 07/07/19 (from the past 48 hour(s))  Comprehensive metabolic panel     Status: Abnormal   Collection Time: 07/07/19 10:33 AM  Result Value Ref Range   Sodium 137 135 - 145 mmol/L   Potassium 4.6 3.5 - 5.1 mmol/L   Chloride 105 98 - 111 mmol/L   CO2 20 (L) 22 - 32 mmol/L   Glucose, Bld 84 70 - 99 mg/dL    Comment: Glucose reference range applies only to samples taken after fasting for at least 8 hours.   BUN 80 (H) 6 - 20 mg/dL   Creatinine, Ser 5.82 (H) 0.44 - 1.00 mg/dL   Calcium 9.1 8.9 - 10.3 mg/dL   Total Protein 5.9 (L) 6.5 - 8.1 g/dL   Albumin 3.5 3.5 - 5.0 g/dL   AST 30 15 - 41 U/L   ALT 14 0 - 44 U/L   Alkaline Phosphatase 125 38 - 126  U/L  Total Bilirubin 1.4 (H) 0.3 - 1.2 mg/dL   GFR calc non Af Amer 8 (L) >60 mL/min   GFR calc Af Amer 9 (L) >60 mL/min   Anion gap 12 5 - 15    Comment: Performed at [facility] [address] [place], [place] [address]  CBC with Differential     Status: Abnormal   Collection Time: 07/07/19 10:33 AM  Result Value Ref Range   WBC 1.9 (L) 4.0 - 10.5 K/uL   RBC 2.18 (L) 3.87 - 5.11 MIL/uL   Hemoglobin 7.5 (L) 12.0 - 15.0 g/dL   HCT 24.0 (L) 36.0 - 46.0 %   MCV 110.1 (H) 80.0 - 100.0 fL   MCH 34.4 (H) 26.0 - 34.0 pg   MCHC 31.3 30.0 - 36.0 g/dL   RDW 24.0 (H) 11.5 - 15.5 %   Platelets 146 (L) 150 - 400 K/uL   nRBC 1.6 (H) 0.0 - 0.2 %   Neutrophils Relative % 69 %   Neutro Abs 1.3 (L) 1.7 - 7.7 K/uL   Lymphocytes Relative 22 %   Lymphs Abs 0.4 (L) 0.7 - 4.0 K/uL   Monocytes Relative 4 %   Monocytes Absolute 0.1 0.1 - 1.0 K/uL   Eosinophils Relative 4 %   Eosinophils Absolute 0.1 0.0 - 0.5 K/uL   Basophils Relative 0 %   Basophils Absolute 0.0 0.0 - 0.1 K/uL   Immature Granulocytes 1 %   Abs Immature Granulocytes 0.02 0.00 - 0.07 K/uL    Comment: Performed at [facility], [place] [address] [place], [place] [address]  Brain natriuretic peptide     Status: Abnormal   Collection Time: 07/07/19 10:33 AM  Result Value Ref Range   B Natriuretic Peptide 474.1 (H) 0.0 - 100.0 pg/mL    Comment: Performed at [facility] [address] [place], [place] [address]  Lipase, blood     Status: None   Collection Time: 07/07/19 10:33 AM  Result Value Ref Range   Lipase 44 11 - 51 U/L    Comment: Performed at [facility] [address], [place], [place] [address]  Type and screen     Status: None (Preliminary result)   Collection Time: 07/07/19 10:33 AM  Result Value Ref Range   ABO/RH(D) B POS    Antibody Screen NEG    Sample Expiration 07/10/2019,2359    Unit Number [id number]    Blood Component Type RBC, LR IRR    Unit division 00    Status of Unit  ISSUED    Transfusion Status OK TO TRANSFUSE    Crossmatch Result      Compatible Performed at [facility], [place] [address], [place], [place] [address]   Prepare RBC (crossmatch)     Status: None   Collection Time: 07/07/19  1:30 PM  Result Value Ref Range   Order Confirmation      ORDER PROCESSED BY BLOOD BANK Performed at [facility], [place] [address] [place], [place] [address]      ROS:  Pertinent items noted in HPI and remainder of comprehensive ROS otherwise negative.  Physical Exam: Vitals:   07/07/19 1552 07/07/19 1615  BP: 113/65 (!) 106/56  Pulse: 69   Resp: 16   Temp: 98.1 F (36.7 C)   SpO2: 100%      General exam: Ill-looking female, lying on bed Respiratory system: Clear bilateral, no wheezing or increased work of breathing.  Cardiovascular system: S1 & S2 heard, RRR.  Bilateral upper and lower extremity pitting edema present Gastrointestinal system: Abdomen is distended, soft and nontender. Normal bowel sounds heard. Central nervous system: Alert and oriented. No focal neurological deficits.  No asterixis Extremities: Symmetric 5 x 5 power.  Edema present Skin: No rashes, lesions or ulcers Psychiatry: Judgement and insight appear normal. Mood & affect appropriate.   Assessment/Plan:  #Acute kidney injury on CKD versus progressive CKD IV: The serum creatinine level has been worsened since she had Covid infection in 05/2019.  Now she has fluid overload associated with mild uremic symptoms.  Acute rise in creatinine probably because of decreased oral intake and anemia. Follow-up urinalysis.  No need for kidney ultrasound as recent one was unremarkable.  I will order bladder scan. Start Lasix 80 mg IV twice a day. Monitor urine output, BMP.  If no improvement in renal function by tomorrow she will need dialysis.  It was discussed with her and she agreed.  I will keep her n.p.o. after midnight in case if we need to place HD catheter tomorrow.  #Kidney  transplant status: Resume Prograf and prednisone.  No need to check Prograf level since the Prograf level were variable in the past because of no accurate 12 hours trough. Holding Imuran because of neutropenia.  #Anemia of CKD, symptomatic: Saturation 83%.  Received Epogen on 6/4.  Receiving a unit of blood transfusion in ER.  #Hypotension/volume: Continue midodrine.  Diuretics as above to manage volume.  #CKD-MBD: Check phosphorus level.  #Pancytopenia: WBC count is only 1.9.  I will hold Imuran.  Continue other immunosuppressant.  May consider hematology consult.  Thank you for the consult.  Will follow with you.   Haylo Fake Tanna Furry 07/07/2019, 4:55 PM  [facility].

## 2019-07-07 NOTE — ED Notes (Signed)
No s/sx of infusion reaction.  Pt on phone with family, continues to c/o HA and grasps at R side occiput intermittently.

## 2019-07-07 NOTE — ED Provider Notes (Signed)
[facility] EMERGENCY DEPARTMENT Provider Note   CSN: [id number] Arrival date & time: 07/07/19  0109     History Chief Complaint  Patient presents with  . Weakness  . Abnormal Lab    Tina Watkins is a 54 y.o. female.  Patient is a 54 year old female with extensive past medical history with history of prior renal transplant, end-stage renal disease, congestive heart failure, prior CVA.  She presents today for evaluation of weakness, fatigue, and low hemoglobin.  She was sent from an outside clinic for consideration of a blood transfusion.  She was transfused several weeks ago.  She denies any black or bloody stool.  No abd pain, fevers, or chills.  The history is provided by the patient.  Weakness Severity:  Moderate Onset quality:  Gradual Duration:  1 week Timing:  Constant Progression:  Worsening Chronicity:  Recurrent Relieved by:  Nothing Worsened by:  Nothing Ineffective treatments:  None tried Abnormal Lab      Past Medical History:  Diagnosis Date  . Bacteremia due to Gram-negative bacteria 05/23/2011  . Blind    right eye  . Blind right eye   . CHF (congestive heart failure) ([place])   . Chronic lower back pain   . Complication of anesthesia    "woke up during OR; I have an extremely high tolerance" (12/11/2011) 1 procedure was graft; the other procedures were procedures that are typically done with sedation.  . DDD (degenerative disc disease), cervical   . Depression   . Dysrhythmia    "tachycardia" (12/11/2011) new onset afib 10/15/14 EKG  . E coli bacteremia 06/18/2011  . Elevated LDL cholesterol level 12/2018  . ESRD (end stage renal disease) ([place]) 06/12/2011  . Fibromyalgia   . Gastroparesis   . Gastroparesis   . GERD (gastroesophageal reflux disease)   . Glaucoma    right eye  . Gout   . H/O carpal tunnel syndrome   . Headache(784.0)    "not often anymore" (12/11/2011)  . Herpes genitalia 1994  . History of blood  transfusion    "more than a few times" (12/11/2011)  . History of stomach ulcers   . Hypotension   . Iron deficiency anemia   . New onset a-fib ([place])    10/15/14 EKG  . Osteopenia   . Peripheral neuropathy 11/2018  . Seizures ([place]) 1994   "post transplant; only have had that one" (12/11/2011)  . Spinal stenosis in cervical region   . Stroke [facility])     left basal ganglia lacunar infarct; Right frontal lobe lacunar infarct.  . Stroke [facility]) ~ 1999; 2001   "briefly lost my vision; lost my right eye" (12/11/2011)  . Vitamin D deficiency 12/2018    Patient Active Problem List   Diagnosis Date Noted  . Weakness 06/13/2019  . Pneumonia due to COVID-19 virus 05/09/2019  . Hypotension 05/09/2019  . Anemia 05/09/2019  . CHF (congestive heart failure) ([place]) 05/09/2019  . Swollen abdomen 01/04/2019  . DDD (degenerative disc disease), cervical 12/06/2018  . Neuropathy 12/06/2018  . Paresthesia 10/11/2018  . Neck pain 10/11/2018  . Tobacco abuse 11/20/2017  . Right leg swelling 11/20/2017  . CKD (chronic kidney disease), stage III 11/20/2017  . Right leg pain 11/20/2017  . Stroke (cerebrum) ([place]) 11/20/2017  . GERD (gastroesophageal reflux disease) 11/20/2017  . Acute venous embolism and thrombosis of deep vessels of proximal lower extremity, right ([place]) 11/20/2017  . 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 Thrush, oral   . AKI (acute kidney injury) ([place])   . Multifocal pneumonia 08/09/2017  . Chest pain 09/29/2015  . Cervical stenosis of spinal canal 01/08/2015  . Urinary tract infectious disease   . Muscle spasms of neck 06/15/2014  . Bleeding hemorrhoid 06/15/2014  . Anemia in chronic kidney disease 06/15/2014  . Acute on chronic renal failure ([place]) 06/13/2014  . Pyelonephritis, acute 06/13/2014  . Sepsis ([place]) 06/13/2014  . History of kidney transplant   . Chronic pain syndrome 04/09/2012  . Dehydration, mild 04/09/2012  . Gout attack  06/23/2011  . Herpes infection 06/23/2011  . Anxiety 06/23/2011  . E coli bacteremia 06/18/2011  . ESRD (end stage renal disease) ([place]) 06/12/2011  . UTI (lower urinary tract infection) 06/12/2011  . Bacteremia due to Gram-negative bacteria 05/23/2011  . History of renal transplantation 05/22/2011  . Septic shock(785.52) 05/22/2011  . Acute on chronic kidney failure ([place]) 05/22/2011  . Gastroparesis 04/24/2008  . WEIGHT LOSS 08/24/2007  . NAUSEA AND VOMITING 08/24/2007    Past Surgical History:  Procedure Laterality Date  . ANTERIOR CERVICAL DECOMP/DISCECTOMY FUSION N/A 01/08/2015   Procedure: Anterior Cervical Three-Four/Four-Five Decompression/Diskectomy/Fusion;  Surgeon: Leeroy Cha, MD;  Location: [facility];  Service: Neurosurgery;  Laterality: N/A;  C3-4 C4-5 Anterior cervical decompression/diskectomy/fusion  . APPENDECTOMY  ~ 2004  . CATARACT EXTRACTION     right eye  . COLONOSCOPY    . ENUCLEATION  2001   "right"  . ESOPHAGOGASTRODUODENOSCOPY (EGD) WITH PROPOFOL N/A 04/21/2012   Procedure: ESOPHAGOGASTRODUODENOSCOPY (EGD) WITH PROPOFOL;  Surgeon: Milus Banister, MD;  Location: [facility];  Service: Endoscopy;  Laterality: N/A;  . 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 [place]; 1999; 2005   "right"  . MULTIPLE TOOTH EXTRACTIONS    . RIGHT HEART CATH N/A 03/23/2019   Procedure: RIGHT HEART CATH;  Surgeon: Jolaine Artist, MD;  Location: [facility];  Service: Cardiovascular;  Laterality: N/A;  . TONSILLECTOMY    . TOTAL NEPHRECTOMY  1988?; 1994; 2005     OB History    Gravida  1   Para      Term      Preterm      AB  1   Living        SAB  1   TAB      Ectopic      Multiple      Live Births              Family History  Problem Relation Age of Onset  . Glaucoma Mother   . Pancreatic cancer Father   . Multiple sclerosis Brother   . Hypertension  Maternal Grandmother   . Breast cancer Neg Hx     Social History   Tobacco Use  . Smoking status: Current Every Day Smoker    Packs/day: 0.20    Years: 28.00    Pack years: 5.60    Types: Cigarettes  . Smokeless tobacco: Never Used  Substance Use Topics  . Alcohol use: Yes    Comment: rare  . Drug use: Yes    Types: Marijuana    Comment: occasional use    Home Medications Prior to Admission medications   Medication Sig Start Date End Date Taking? Authorizing Provider  aspirin EC 81 MG tablet Take 81 mg by mouth  daily.     [provider]  azaTHIOprine (IMURAN) 50 MG tablet Take 100 mg by mouth daily.     [provider]  colchicine 0.6 MG tablet Take 0.6-1.2 mg by mouth See admin instructions. Take 1.2 mg by mouth at onset, then take 0.6 mg three times a day as need for gout flare    [provider]  Darbepoetin Alfa (ARANESP, ALBUMIN FREE, IJ) Inject as directed every 14 (fourteen) days.    [provider]  famotidine (PEPCID) 20 MG tablet Take 1 tablet (20 mg total) by mouth 2 (two) times daily. 06/13/19   [name], FNP  fluticasone (FLONASE) 50 MCG/ACT nasal spray Place 1 spray into both nostrils daily as needed for allergies or rhinitis.    [provider]  gabapentin (NEURONTIN) 100 MG capsule Take 100 mg by mouth 2 (two) times daily.  09/27/18   [provider]  latanoprost (XALATAN) 0.005 % ophthalmic solution Place 1 drop into the left eye at bedtime. 03/30/17   [provider]  lidocaine (LIDODERM) 5 % Place 1 patch onto the skin daily as needed (for pain- remove old patch first).  10/28/18   [provider]  midodrine (PROAMATINE) 5 MG tablet Take 1 tablet (5 mg total) by mouth 2 (two) times daily with a meal. 05/18/19   Ghimire, Henreitta Leber, MD  nystatin (MYCOSTATIN) 100000 UNIT/ML suspension Use as directed 5 mLs in the mouth or throat daily as needed (FOR THRUSH).     [provider]    ondansetron (ZOFRAN-ODT) 4 MG disintegrating tablet Take 1 tablet (4 mg total) by mouth every 8 (eight) hours as needed for nausea or vomiting. 06/13/19   [name], FNP  oxyCODONE-acetaminophen (PERCOCET) 10-325 MG tablet Take 1 tablet by mouth See admin instructions. Take 1 tablet by mouth every four to six hours as needed for pain    [provider]  predniSONE (DELTASONE) 5 MG tablet Take 5 mg by mouth daily. 06/22/11   [name], MD  PROVENTIL HFA 108 [phone] Base) MCG/ACT inhaler Inhale 2 puffs into the lungs 4 (four) times daily as needed for shortness of breath. 08/04/17   [provider]  sodium bicarbonate 650 MG tablet Take 1 tablet (650 mg total) by mouth 2 (two) times daily. 05/18/19   Ghimire, Henreitta Leber, MD  tacrolimus (PROGRAF) 1 MG capsule Take 2-3 mg by mouth See admin instructions. Take 3 mg by mouth in the morning and 2 mg at bedtime 06/22/11   [name], MD  Vitamin D, Ergocalciferol, (DRISDOL) 1.25 MG (50000 UNIT) CAPS capsule Take 50,000 Units by mouth every Sunday.     [provider]  zolpidem (AMBIEN) 10 MG tablet Take 10 mg by mouth at bedtime as needed for sleep.    [provider]    Allergies    Levofloxacin and Tape  Review of Systems   Review of Systems  Neurological: Positive for weakness.  All other systems reviewed and are negative.   Physical Exam Updated Vital Signs BP (!) 103/55   Pulse (!) 57   Temp 97.8 F (36.6 C) (Oral)   Resp 15   Ht 4\' 11"  (1.499 m)   Wt 62.6 kg   SpO2 98%   BMI 27.87 kg/m   Physical Exam Vitals and nursing note reviewed.  Constitutional:      Appearance: She is well-developed.     Comments: Patient is a chronically ill-appearing female in  no acute distress.  HENT:     Head: Normocephalic and atraumatic.  Cardiovascular:     Rate and Rhythm: Normal rate and regular rhythm.     Heart sounds: No murmur. No friction rub. No gallop.   Pulmonary:     Effort:  Pulmonary effort is normal. No respiratory distress.     Breath sounds: Normal breath sounds. No wheezing.  Abdominal:     General: Bowel sounds are normal. There is no distension.     Palpations: Abdomen is soft.     Tenderness: There is no abdominal tenderness.  Musculoskeletal:        General: Normal range of motion.     Cervical back: Normal range of motion and neck supple.     Right lower leg: Edema present.     Left lower leg: Edema present.     Comments: There is 1-2+ pitting edema of both lower extremities.    Skin:    General: Skin is warm and dry.  Neurological:     Mental Status: She is alert and oriented to person, place, and time.     ED Results / Procedures / Treatments   Labs (all labs ordered are listed, but only abnormal results are displayed) Labs Reviewed  CBC WITH DIFFERENTIAL/PLATELET - Abnormal; Notable for the following components:      Result Value   WBC 1.9 (*)    RBC 2.18 (*)    Hemoglobin 7.5 (*)    HCT 24.0 (*)    MCV 110.1 (*)    MCH 34.4 (*)    RDW 24.0 (*)    Platelets 146 (*)    nRBC 1.6 (*)    All other components within normal limits  COMPREHENSIVE METABOLIC PANEL  BRAIN NATRIURETIC PEPTIDE  LIPASE, BLOOD  TYPE AND SCREEN    EKG EKG Interpretation  Date/Time:  Friday July 07 2019 09:34:46 EDT Ventricular Rate:  51 PR Interval:  146 QRS Duration: 66 QT Interval:  486 QTC Calculation: 447 R Axis:   46 Text Interpretation: Sinus bradycardia Low voltage QRS Possible Lateral infarct , age undetermined Possible Inferior infarct , age undetermined Abnormal ECG Confirmed by Veryl Speak [phone]) on 07/07/2019 3:33:31 PM   Radiology No results found.  Procedures Procedures (including critical care time)  Medications Ordered in ED Medications - No data to display  ED Course  I have reviewed the triage vital signs and the nursing notes.  Pertinent labs & imaging results that were available during my care of the patient were  reviewed by me and considered in my medical decision making (see chart for details).    MDM Rules/Calculators/A&P  Patient presenting here with complaints of weakness and fatigue.  Patient with history of renal transplant.  Her hemoglobin today is 7.5 and was sent from the office for transfusion and further work-up.  Patient also found to have creatinine increased from 3.5-5.8.  1 unit of packed red cells has been ordered and will be administered.  I discussed the worsening renal function with Dr. Carolin Sicks from nephrology.  We are in agreement that patient should be admitted for close observation while this transfusion is occurring.  Urinalysis currently pending.  I spoke with Dr. Roosevelt Locks from the hospitalist service who agrees to admit.  CRITICAL CARE Performed by: Veryl Speak Total critical care time: 35 minutes Critical care time was exclusive of separately billable procedures and treating other patients. Critical care was necessary to treat or prevent imminent or life-threatening deterioration. Critical care  was time spent personally by me on the following activities: development of treatment plan with patient and/or surrogate as well as nursing, discussions with consultants, evaluation of patient's response to treatment, examination of patient, obtaining history from patient or surrogate, ordering and performing treatments and interventions, ordering and review of laboratory studies, ordering and review of radiographic studies, pulse oximetry and re-evaluation of patient's condition.   Final Clinical Impression(s) / ED Diagnoses Final diagnoses:  None    Rx / DC Orders ED Discharge Orders    None       Veryl Speak, MD 07/07/19 1535

## 2019-07-07 NOTE — H&P (Signed)
History and Physical    Tina Watkins DVV:[id number] DOB: 1965-09-13 DOA: 07/07/2019  PCP: [name], FNP (Confirm with patient/family/NH records and if not entered, this has to be entered at [facility] point of entry) Patient coming from: Home  I have personally briefly reviewed patient's old medical records in [place]  Chief Complaint: Feeling tired  HPI: Tina Watkins is a 54 y.o. female with medical history significant of ESRD status post renal transplant complicated by CKD stage IV on chronic steroids and immunosuppressive therapy, chronic anemia 2/2 CKD, hypertension, TIA, chronic diastolic congestive heart failure, moderate pulmonary hypertension, presented with increasing fatigue and abnormal Hb level.  Patient has chronic anemia, receives EPO infusion every 2 weeks, and multiple PRBC transfusion in the past and the most recent transfusion was 2 weeks ago with 1 unit in nephrologist office.  Patient has had increasing short of breath for the last 1 week with exertions.  Denies any cough, no chest pain no fever chills.  She went to her PCP today and blood work found that her hemoglobin remains low despite blood transfusion recently and was sent to the ED for consideration of blood transfusion. ED Course: Hemoglobin 7.5 compared to 2 weeks ago same reading.  Blood work showed worsening of kidney function with creatinine 5.8 compared to 3.4 73-month ago, BUN 80  Review of Systems: As per HPI otherwise 10 point review of systems negative.    Past Medical History:  Diagnosis Date  . Bacteremia due to Gram-negative bacteria 05/23/2011  . Blind    right eye  . Blind right eye   . CHF (congestive heart failure) ([place])   . Chronic lower back pain   . Complication of anesthesia    "woke up during OR; I have an extremely high tolerance" (12/11/2011) 1 procedure was graft; the other procedures were procedures that are typically done with sedation.  . DDD  (degenerative disc disease), cervical   . Depression   . Dysrhythmia    "tachycardia" (12/11/2011) new onset afib 10/15/14 EKG  . E coli bacteremia 06/18/2011  . Elevated LDL cholesterol level 12/2018  . ESRD (end stage renal disease) ([place]) 06/12/2011  . Fibromyalgia   . Gastroparesis   . Gastroparesis   . GERD (gastroesophageal reflux disease)   . Glaucoma    right eye  . Gout   . H/O carpal tunnel syndrome   . Headache(784.0)    "not often anymore" (12/11/2011)  . Herpes genitalia 1994  . History of blood transfusion    "more than a few times" (12/11/2011)  . History of stomach ulcers   . Hypotension   . Iron deficiency anemia   . New onset a-fib ([place])    10/15/14 EKG  . Osteopenia   . Peripheral neuropathy 11/2018  . Seizures ([place]) 1994   "post transplant; only have had that one" (12/11/2011)  . Spinal stenosis in cervical region   . Stroke [facility])     left basal ganglia lacunar infarct; Right frontal lobe lacunar infarct.  . Stroke [facility]) ~ 1999; 2001   "briefly lost my vision; lost my right eye" (12/11/2011)  . Vitamin D deficiency 12/2018    Past Surgical History:  Procedure Laterality Date  . ANTERIOR CERVICAL DECOMP/DISCECTOMY FUSION N/A 01/08/2015   Procedure: Anterior Cervical Three-Four/Four-Five Decompression/Diskectomy/Fusion;  Surgeon: Leeroy Cha, MD;  Location: [place] NEURO ORS;  Service: Neurosurgery;  Laterality: N/A;  C3-4 C4-5 Anterior cervical decompression/diskectomy/fusion  . APPENDECTOMY  ~ 2004  . CATARACT  EXTRACTION     right eye  . COLONOSCOPY    . ENUCLEATION  2001   "right"  . ESOPHAGOGASTRODUODENOSCOPY (EGD) WITH PROPOFOL N/A 04/21/2012   Procedure: ESOPHAGOGASTRODUODENOSCOPY (EGD) WITH PROPOFOL;  Surgeon: Milus Banister, MD;  Location: [facility] ENDOSCOPY;  Service: Endoscopy;  Laterality: N/A;  . 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 [place]; 1999; 2005   "right"  .  MULTIPLE TOOTH EXTRACTIONS    . RIGHT HEART CATH N/A 03/23/2019   Procedure: RIGHT HEART CATH;  Surgeon: Jolaine Artist, MD;  Location: [facility];  Service: Cardiovascular;  Laterality: N/A;  . TONSILLECTOMY    . [place]?; 1994; 2005     reports that she has been smoking cigarettes. She has a 5.60 pack-year smoking history. She has never used smokeless tobacco. She reports current alcohol use. She reports current drug use. Drug: Marijuana.  Allergies  Allergen Reactions  . Levofloxacin Itching and Rash  . Tape Other (See Comments)    "Certain surgical tapes peel off my skin"    Family History  Problem Relation Age of Onset  . Glaucoma Mother   . Pancreatic cancer Father   . Multiple sclerosis Brother   . Hypertension Maternal Grandmother   . Breast cancer Neg Hx      Prior to Admission medications   Medication Sig Start Date End Date Taking? Authorizing Provider  aspirin EC 81 MG tablet Take 81 mg by mouth daily.    Yes [provider]  azaTHIOprine (IMURAN) 50 MG tablet Take 100 mg by mouth daily.    Yes [provider]  colchicine 0.6 MG tablet Take 0.6-1.2 mg by mouth See admin instructions. Take 1.2 mg by mouth at onset, then take 0.6 mg three times a day as need for gout flare   Yes [provider]  Darbepoetin Alfa (ARANESP, ALBUMIN FREE, IJ) Inject as directed every 14 (fourteen) days.   Yes [provider]  famotidine (PEPCID) 20 MG tablet Take 1 tablet (20 mg total) by mouth 2 (two) times daily. Patient taking differently: Take 20 mg by mouth as needed for heartburn.  06/13/19  Yes [name], FNP  fluticasone (FLONASE) 50 MCG/ACT nasal spray Place 1 spray into both nostrils daily as needed for allergies or rhinitis.   Yes [provider]  furosemide (LASIX) 80 MG tablet Take 80 mg by mouth daily. 06/25/19  Yes [provider]  gabapentin (NEURONTIN) 100 MG capsule Take 100 mg by mouth  2 (two) times daily.  09/27/18  Yes [provider]  latanoprost (XALATAN) 0.005 % ophthalmic solution Place 1 drop into the left eye at bedtime. 03/30/17  Yes [provider]  lidocaine (LIDODERM) 5 % Place 1 patch onto the skin daily as needed (for pain- remove old patch first).  10/28/18  Yes [provider]  midodrine (PROAMATINE) 5 MG tablet Take 1 tablet (5 mg total) by mouth 2 (two) times daily with a meal. 05/18/19  Yes Ghimire, Henreitta Leber, MD  nystatin (MYCOSTATIN) 100000 UNIT/ML suspension Use as directed 5 mLs in the mouth or throat daily as needed (FOR THRUSH).    Yes [provider]  ondansetron (ZOFRAN-ODT) 4 MG disintegrating tablet Take 1 tablet (4 mg total) by mouth every 8 (eight) hours as needed for nausea or vomiting. 06/13/19  Yes [name], FNP  oxyCODONE-acetaminophen (PERCOCET) 10-325 MG tablet Take 1 tablet by mouth See admin instructions. Take 1 tablet by mouth every four to six hours as needed for pain   Yes [provider]  predniSONE (DELTASONE) 5 MG tablet Take 5 mg by mouth daily. 06/22/11  Yes [name], MD  PROVENTIL HFA 108 [phone] Base) MCG/ACT inhaler Inhale 2 puffs into the lungs 4 (four) times daily as needed for shortness of breath. 08/04/17  Yes [provider]  tacrolimus (PROGRAF) 1 MG capsule Take 2-3 mg by mouth See admin instructions. Take 3 mg by mouth in the morning and 2 mg at bedtime 06/22/11  Yes [name], MD  Vitamin D, Ergocalciferol, (DRISDOL) 1.25 MG (50000 UNIT) CAPS capsule Take 50,000 Units by mouth every Sunday.    Yes [provider]  zolpidem (AMBIEN) 10 MG tablet Take 10 mg by mouth at bedtime as needed for sleep.   Yes [provider]  sodium bicarbonate 650 MG tablet Take 1 tablet (650 mg total) by mouth 2 (two) times daily. Patient not taking: Reported on 07/07/2019 05/18/19   Jonetta Osgood, MD    Physical Exam: Vitals:   07/07/19 1531 07/07/19  1545 07/07/19 1552 07/07/19 1615  BP: 138/67 118/76 113/65 (!) 106/56  Pulse: 80 71 69   Resp: 16  16   Temp: 97.9 F (36.6 C)  98.1 F (36.7 C)   TempSrc: Oral  Oral   SpO2: 99% 100% 100%   Weight:      Height:        Constitutional: NAD, calm, comfortable Vitals:   07/07/19 1531 07/07/19 1545 07/07/19 1552 07/07/19 1615  BP: 138/67 118/76 113/65 (!) 106/56  Pulse: 80 71 69   Resp: 16  16   Temp: 97.9 F (36.6 C)  98.1 F (36.7 C)   TempSrc: Oral  Oral   SpO2: 99% 100% 100%   Weight:      Height:       Eyes: PERRL, lids and conjunctivae normal ENMT: Mucous membranes are moist. Posterior pharynx clear of any exudate or lesions.Normal dentition.  Neck: normal, supple, no masses, no thyromegaly Respiratory: clear to auscultation bilaterally, no wheezing, no crackles. Normal respiratory effort. No accessory muscle use.  Cardiovascular: Regular rate and rhythm, no murmurs / rubs / gallops. 1+ extremity edema. 2+ pedal pulses. No carotid bruits.  Abdomen: no tenderness, no masses palpated. No hepatosplenomegaly. Bowel sounds positive.  Musculoskeletal: no clubbing / cyanosis. No joint deformity upper and lower extremities. Good ROM, no contractures. Normal muscle tone.  Skin: no rashes, lesions, ulcers. No induration Neurologic: CN 2-12 grossly intact. Sensation intact, DTR normal. Strength 5/5 in all 4.  Psychiatric: Normal judgment and insight. Alert and oriented x 3. Normal mood.    Labs on Admission: I have personally reviewed following labs and imaging studies  CBC: Recent Labs  Lab 07/07/19 0918 07/07/19 1033  WBC  --  1.9*  NEUTROABS  --  1.3*  HGB 7.7* 7.5*  HCT  --  24.0*  MCV  --  110.1*  PLT  --  287*   Basic Metabolic Panel: Recent Labs  Lab 07/07/19 1033  NA 137  K 4.6  CL 105  CO2 20*  GLUCOSE 84  BUN 80*  CREATININE 5.82*  CALCIUM 9.1   GFR: Estimated Creatinine Clearance: 9.2 mL/min (A) (by C-G formula based on SCr of 5.82 mg/dL (H)).  Liver Function Tests: Recent Labs  Lab 07/07/19 1033  AST 30  ALT 14  ALKPHOS 125  BILITOT 1.4*  PROT 5.9*  ALBUMIN 3.5   Recent Labs  Lab 07/07/19 1033  LIPASE 44   No results for input(s): AMMONIA in the last 168 hours. Coagulation Profile: No results for input(s): INR, PROTIME in the last 168 hours. Cardiac Enzymes: No results for input(s): CKTOTAL, CKMB, CKMBINDEX, TROPONINI in the last 168 hours. BNP (last 3 results) No results for input(s): PROBNP in the last 8760 hours. HbA1C: No results for input(s): HGBA1C in the last 72 hours. CBG: No results for input(s): GLUCAP in the last 168 hours. Lipid Profile: No results for input(s): CHOL, HDL, LDLCALC, TRIG, CHOLHDL, LDLDIRECT in the last 72 hours. Thyroid Function Tests: No results for input(s): TSH, T4TOTAL, FREET4, T3FREE, THYROIDAB in the last 72 hours. Anemia Panel: No results for input(s): VITAMINB12, FOLATE, FERRITIN, TIBC, IRON, RETICCTPCT in the last 72 hours. Urine analysis:    Component Value Date/Time   COLORURINE YELLOW 05/09/2019 0533   APPEARANCEUR HAZY (A) 05/09/2019 0533   LABSPEC 1.013 05/09/2019 0533   PHURINE 7.0 05/09/2019 0533   GLUCOSEU NEGATIVE 05/09/2019 0533   HGBUR NEGATIVE 05/09/2019 0533   BILIRUBINUR NEGATIVE 05/09/2019 0533   BILIRUBINUR Negative 01/04/2019 1030   KETONESUR NEGATIVE 05/09/2019 0533   PROTEINUR 30 (A) 05/09/2019 0533   UROBILINOGEN 0.2 01/04/2019 1030   UROBILINOGEN 0.2 10/15/2014 0946   NITRITE NEGATIVE 05/09/2019 0533   LEUKOCYTESUR SMALL (A) 05/09/2019 0533    Radiological Exams on Admission: DG Chest 1 View  Result Date: 07/07/2019 CLINICAL DATA:  Weakness EXAM: CHEST  1 VIEW COMPARISON:  05/16/2019 FINDINGS: Small left pleural effusion. Heart is mildly enlarged. Bilateral lower lobe airspace opacities. No acute bony abnormality. IMPRESSION: Bilateral lower lobe atelectasis or infiltrates. Small left effusion. Electronically Signed   By: Rolm Baptise M.D.    On: 07/07/2019 17:13    EKG: Independently reviewed.   Assessment/Plan Active Problems:   Anemia  Acute on chronic anemia with signs of pancytopenia -We will get 1 unit packed RBC today.  Case discussed with on-call hematologist Dr. Irene Limbo, who feels that patient is chronically bone marrow suppressed with immune modulation medications, seems to be the reason for anemia and pancytopenia.  He recommend check Imuran level, and outpatient follow-up with nephrology to discuss about adjustment of immunomodulation medications.  Nephrology also consulted who will come to see the patient today. -Her iron study done last month showing iron overload, hematologist consider this finding related to repeated blood transfusion as well as chronic inflammation?  Related to kidney transplant, not sure whether pt also received Iron infusion in the past, pt could not tell.  AKI on CKD stage IV -Looks like there is a deterioration of her kidney function compared to last month, check a transplanted kidney ultrasound/Doppler, nephrology consultation to follow. -Patient looks euvolemic, no significant hyper kalemia, no significant acidosis, BUN elevated but patient denied any chest pain abdominal pain or headache, BP fair, will hold off IVF for now -Cut down Midodrine dose  Kidney transplant -Check tacrolimus level -Ultrasound  Pulmonary hypertension -PE was ruled out by VQ scan in February 0347  Chronic diastolic CHF -No acute issue  DVT prophylaxis: Heparin subcu Code Status: Full code Family Communication: None at bedside Disposition Plan: Probably can go home in 24-48hr if ok with nephro Consults called: Nephro Admission status: MedSurg   Lequita Halt MD [facility] Pager [phone]    07/07/2019, 5:18 PM

## 2019-07-07 NOTE — Progress Notes (Signed)
Nephrology consult appreciated, given the worsening of her kidney function, nephro will assess for starting HD tomorrow. Upgrade to admission for close monitoring as pt is getting high dose of lasix challenge.

## 2019-07-07 NOTE — ED Notes (Signed)
Hx of anemia and had transfusion approx 2 weeks ago at [facility].  C/o chronic pain to back, neck and shoulders.  No CP or abd pain.  Dyspnea noted with rest and exertion.  Pt alert and oriented and able to provide history.  Sent by [facility] for further testing and evaluation.

## 2019-07-07 NOTE — ED Notes (Signed)
Admitting MD in with pt for evaluation.  Pt states no relief with pain meds at this time.

## 2019-07-08 DIAGNOSIS — D631 Anemia in chronic kidney disease: Secondary | ICD-10-CM

## 2019-07-08 DIAGNOSIS — N179 Acute kidney failure, unspecified: Secondary | ICD-10-CM

## 2019-07-08 DIAGNOSIS — D61818 Other pancytopenia: Secondary | ICD-10-CM

## 2019-07-08 DIAGNOSIS — N185 Chronic kidney disease, stage 5: Secondary | ICD-10-CM

## 2019-07-08 LAB — C-REACTIVE PROTEIN: CRP: 1.2 mg/dL — ABNORMAL HIGH (ref <1.0)

## 2019-07-08 LAB — BPAM RBC
Blood Product Expiration Date: 202106302359
ISSUE DATE / TIME: 202106041514
Unit Type and Rh: 5100

## 2019-07-08 LAB — CBC
HCT: 25.7 % — ABNORMAL LOW (ref 36.0–46.0)
Hemoglobin: 8.2 g/dL — ABNORMAL LOW (ref 12.0–15.0)
MCH: 32.9 pg (ref 26.0–34.0)
MCHC: 31.9 g/dL (ref 30.0–36.0)
MCV: 103.2 fL — ABNORMAL HIGH (ref 80.0–100.0)
Platelets: 135 10*3/uL — ABNORMAL LOW (ref 150–400)
RBC: 2.49 MIL/uL — ABNORMAL LOW (ref 3.87–5.11)
RDW: 24.4 % — ABNORMAL HIGH (ref 11.5–15.5)
WBC: 1.5 10*3/uL — ABNORMAL LOW (ref 4.0–10.5)
nRBC: 0 % (ref 0.0–0.2)

## 2019-07-08 LAB — TYPE AND SCREEN
ABO/RH(D): B POS
Antibody Screen: NEGATIVE
Unit division: 0

## 2019-07-08 LAB — BASIC METABOLIC PANEL
Anion gap: 11 (ref 5–15)
BUN: 82 mg/dL — ABNORMAL HIGH (ref 6–20)
CO2: 20 mmol/L — ABNORMAL LOW (ref 22–32)
Calcium: 9.1 mg/dL (ref 8.9–10.3)
Chloride: 106 mmol/L (ref 98–111)
Creatinine, Ser: 5.61 mg/dL — ABNORMAL HIGH (ref 0.44–1.00)
GFR calc Af Amer: 9 mL/min — ABNORMAL LOW (ref >60)
GFR calc non Af Amer: 8 mL/min — ABNORMAL LOW (ref >60)
Glucose, Bld: 82 mg/dL (ref 70–99)
Potassium: 5 mmol/L (ref 3.5–5.1)
Sodium: 137 mmol/L (ref 135–145)

## 2019-07-08 LAB — HEPATITIS B SURFACE ANTIGEN: Hepatitis B Surface Ag: NONREACTIVE

## 2019-07-08 LAB — SEDIMENTATION RATE: Sed Rate: 37 mm/hr — ABNORMAL HIGH (ref 0–22)

## 2019-07-08 LAB — HIV ANTIBODY (ROUTINE TESTING W REFLEX): HIV Screen 4th Generation wRfx: NONREACTIVE

## 2019-07-08 LAB — HEPATITIS C ANTIBODY: HCV Ab: NONREACTIVE

## 2019-07-08 LAB — VITAMIN B12: Vitamin B-12: 619 pg/mL (ref 180–914)

## 2019-07-08 MED ORDER — DIPHENHYDRAMINE HCL 25 MG PO CAPS
25.0000 mg | ORAL_CAPSULE | Freq: Four times a day (QID) | ORAL | Status: DC | PRN
  Administered 2019-07-08 – 2019-07-10 (×3): 25 mg via ORAL
  Filled 2019-07-08 (×4): qty 1

## 2019-07-08 MED ORDER — PHENOL 1.4 % MT LIQD
1.0000 | OROMUCOSAL | Status: DC | PRN
  Filled 2019-07-08: qty 177

## 2019-07-08 MED ORDER — PNEUMOCOCCAL VAC POLYVALENT 25 MCG/0.5ML IJ INJ
0.5000 mL | INJECTION | INTRAMUSCULAR | Status: DC
  Filled 2019-07-08: qty 0.5

## 2019-07-08 MED ORDER — DIPHENHYDRAMINE HCL 50 MG/ML IJ SOLN
12.5000 mg | Freq: Three times a day (TID) | INTRAMUSCULAR | Status: DC | PRN
  Administered 2019-07-16: 12.5 mg via INTRAVENOUS
  Filled 2019-07-08: qty 1

## 2019-07-08 MED ORDER — OXYCODONE HCL 5 MG PO TABS
5.0000 mg | ORAL_TABLET | ORAL | Status: DC | PRN
  Administered 2019-07-08 – 2019-07-09 (×4): 5 mg via ORAL
  Filled 2019-07-08 (×4): qty 1

## 2019-07-08 MED ORDER — ONDANSETRON HCL 4 MG/2ML IJ SOLN
4.0000 mg | Freq: Four times a day (QID) | INTRAMUSCULAR | Status: DC | PRN
  Administered 2019-07-08 – 2019-07-16 (×3): 4 mg via INTRAVENOUS
  Filled 2019-07-08 (×3): qty 2

## 2019-07-08 NOTE — Progress Notes (Addendum)
[facility] NEPHROLOGY PROGRESS NOTE  Assessment/ Plan: Pt is a 54 y.o. yo female  with history of ESRD status post kidney transplant, CKD 4 followed by Dr. Alric Quan, TIA, recent prolonged hospitalization for Covid pneumonia when she developed AKI on CKD, presented with generalized weakness, decreased oral intake and worsening anemia, seen as a consultation for AKI on CKD.  #Acute kidney injury on CKD versus progressive CKD IV: The serum creatinine level has been worsened since she had Covid infection in 05/2019.  Now she has fluid overload.  Acute rise in creatinine probably because of decreased oral intake and anemia. Urinalysis is bland.  Recent kidney ultrasound/Doppler was normal in 05/2019. Nonoliguric and creatinine level trending down.  Continue IV Lasix 80 mg twice a day. Monitor labs and urine output and assess daily for dialysis need.  Patient agreed for dialysis if needed.  No need for dialysis today therefore resume diet.  #Kidney transplant status: Resume Prograf and prednisone.  No need to check Prograf level since the Prograf level were variable in the past because of no accurate 12 hours trough. Holding Imuran because of neutropenia.  #Anemia of CKD, symptomatic: Saturation 83%.  Received Epogen on 6/4.  Receiving a unit of blood transfusion in ER.  Hemoglobin 8.2.  #Hypotension/volume: Continue midodrine.  Diuretics as above to manage volume.  #CKD-MBD: Check phosphorus level.  #Pancytopenia: WBC count is worsening.  Holding Imuran.  Recommend hematology consult.    Subjective: Seen and examined at bedside.  Urine output is recorded 700 cc.  Creatinine level trending down to 5.6.  She reports feeling much better today than yesterday.  She has chronic nausea however denies vomiting.  No chest pain, shortness of breath, dysgeusia, headache or dizziness. Objective Vital signs in last 24 hours: Vitals:   07/07/19 1917 07/07/19 1953 07/08/19 0356 07/08/19  0815  BP: 113/73 120/67 (!) 102/55 (!) 98/54  Pulse: (!) 54 60 (!) 57 61  Resp: (!) 21 16 15 18   Temp: 97.8 F (36.6 C) 98 F (36.7 C) 97.9 F (36.6 C) 98.7 F (37.1 C)  TempSrc: Oral Oral Oral Oral  SpO2:  95% 97% 99%  Weight:  63 kg    Height:  4\' 11"  (1.499 m)     Weight change:   Intake/Output Summary (Last 24 hours) at 07/08/2019 1029 Last data filed at 07/08/2019 0500 Gross per 24 hour  Intake 710.33 ml  Output 700 ml  Net 10.33 ml       Labs: Basic Metabolic Panel: Recent Labs  Lab 07/07/19 1033 07/08/19 0350  NA 137 137  K 4.6 5.0  CL 105 106  CO2 20* 20*  GLUCOSE 84 82  BUN 80* 82*  CREATININE 5.82* 5.61*  CALCIUM 9.1 9.1   Liver Function Tests: Recent Labs  Lab 07/07/19 1033  AST 30  ALT 14  ALKPHOS 125  BILITOT 1.4*  PROT 5.9*  ALBUMIN 3.5   Recent Labs  Lab 07/07/19 1033  LIPASE 44   No results for input(s): AMMONIA in the last 168 hours. CBC: Recent Labs  Lab 07/07/19 0918 07/07/19 1033 07/08/19 0350  WBC  --  1.9* 1.5*  NEUTROABS  --  1.3*  --   HGB 7.7* 7.5* 8.2*  HCT  --  24.0* 25.7*  MCV  --  110.1* 103.2*  PLT  --  146* 135*   Cardiac Enzymes: No results for input(s): CKTOTAL, CKMB, CKMBINDEX, TROPONINI in the last 168 hours. CBG: No results for input(s): GLUCAP  in the last 168 hours.  Iron Studies: No results for input(s): IRON, TIBC, TRANSFERRIN, FERRITIN in the last 72 hours. Studies/Results: DG Chest 1 View  Result Date: 07/07/2019 CLINICAL DATA:  Weakness EXAM: CHEST  1 VIEW COMPARISON:  05/16/2019 FINDINGS: Small left pleural effusion. Heart is mildly enlarged. Bilateral lower lobe airspace opacities. No acute bony abnormality. IMPRESSION: Bilateral lower lobe atelectasis or infiltrates. Small left effusion. Electronically Signed   By: Rolm Baptise M.D.   On: 07/07/2019 17:13    Medications: Infusions: . sodium chloride      Scheduled Medications: . aspirin EC  81 mg Oral Daily  . famotidine  20 mg Oral  Daily  . furosemide  80 mg Intravenous Q12H  . gabapentin  100 mg Oral BID  . heparin  5,000 Units Subcutaneous Q8H  . latanoprost  1 drop Left Eye QHS  . midodrine  5 mg Oral BID WC  . [START ON 07/09/2019] pneumococcal 23 valent vaccine  0.5 mL Intramuscular Tomorrow-1000  . predniSONE  5 mg Oral Daily  . tacrolimus  2 mg Oral QHS  . tacrolimus  3 mg Oral q morning - 10a  . [START ON 07/09/2019] Vitamin D (Ergocalciferol)  50,000 Units Oral Q Sun    have reviewed scheduled and prn medications.  Physical Exam: General:NAD, comfortable Heart:RRR, s1s2 nl, no rubs Lungs:clear b/l, no crackle Abdomen:soft, Non-tender, non-distended Extremities: Bilateral upper and lower extremity edema+ Neurology: Alert awake and following commands, no asterixis  Tina Watkins Tanna Furry 07/08/2019,10:29 AM  LOS: 1 day  Pager: [phone]

## 2019-07-08 NOTE — Progress Notes (Addendum)
PROGRESS NOTE    Tina Watkins  UEA:[id number] DOB: 1965-04-22 DOA: 07/07/2019 PCP: [name], FNP   Chef Complaints: Feeling tired  Brief Narrative: 54 year old female with ESRD status post renal transplant complicated by CKD stage IV on chronic steroids and immunosuppressive therapy, chronic anemia secondary to CKD/myelosuppression from chemo on intermittent blood transfusion, regular epo inj by hematology, TIA, chronic diastolic CHF, moderate pulmonary hypertension presented with increasing fatigue and low hemoglobin level along with shortness of breath for last 1 week with exertion no fever no chills, seen by PCP and sent to the ED for transfusion.  Hemoglobin was 7.5.  Blood work also showed worsening renal function in the ED and seen by nephrology for possible dialysis  Subjective:  No acute events overnight.  Afebrile.  Blood pressure soft saturating well on room air. Had nausea last night Takes chronic pain meds oxy at home Asking benadryl for itching. " I get iv pain meds while in hospital"  Assessment & Plan:  Nonoliguric AKI on CKD stage IV vs progressive CKD 4: With worsening BUN/creatinine since Covid infection in April/2021 now with fluid overload and mild uremic symptoms.  Eating a plant, recent kidney ultrasound/Doppler was normal in April.  Appreciate nephrology input, attempting IV diuresis if no improvement noted plan for hemodialysis per nephrology.  No need for dialysis today starting diet. Recent Labs  Lab 07/07/19 1033 07/08/19 0350  BUN 80* 82*  CREATININE 5.82* 5.61*   Fluid overload in the setting of CKD:BNP slightly up 474. Continue IV diuresis as per nephrology monitor intake output, bladder scan.  Output 700 mL since admission. this am already 200 ml uop sicne 7. normaly wt around 112 lb and on admission  138. Asked Rn to check wt.  Wt Readings from Last 3 Encounters:  07/07/19 63 kg  05/17/19 59 kg  05/08/19 52.3 kg   Symptomatic  anemia with history of chronic anemia needing transfusion and on regular EPO injection by hematology.  Status post 1 unit PRBC, hemoglobin improving.  Monitor. Recent Labs  Lab 07/07/19 0918 07/07/19 1033 07/08/19 0350  HGB 7.7* 7.5* 8.2*  HCT  --  24.0* 25.7*   Status post renal transplant on a steroid, tacrolimus, imuran.  Continue steroid and tacrolimus for now holding Imuran per nephro.  Anemia of CKD on Epogen outpatient.  Saturation 83% hemoglobin improving with blood transfusion as above.  Monitor.  Chronic hypotension patient reports he normally runs from 47-82 systolic.She is on midodrine and will be continued.  Pancytopenia in the setting of Imuran immunosuppression.WBC worsening, Imuran on hold. Hematology consult for recommendation. Discussed w/ Dr Nunzio Cobbs advised nephrology to adjust transplant meds- imuran on hold. tacrolimus level high on 4/7 at 28 but down to 15 on 4/11.  Tacrolimus level pending.  Recent Labs  Lab 07/07/19 0918 07/07/19 1033 07/08/19 0350  HGB 7.7* 7.5* 8.2*  HCT  --  24.0* 25.7*  WBC  --  1.9* 1.5*  PLT  --  146* 135*   Pulmonary hypertension/Chronic diastolic CHF: IV diuresis for fluid overload as above.  Monitor intake output and daily weight.  Chronic pain patient reports he takes oxycodone at home and has itching and has to take Benadryl as needed.  We discussed about minimizing narcotics in the setting of renal dysfunction.  DVT prophylaxis:SCD/Heparin Code Status: Full code Family Communication: plan of care discussed with patient at bedside.  Status is: Inpatient Remains inpatient appropriate because:IV treatments appropriate due to intensity of illness or inability to  take PO, Inpatient level of care appropriate due to severity of illness and For ongoing management of fluid overload uremic symptoms with IV diuresis and monitoring of renal function closely  Dispo: The patient is from: Home              Anticipated d/c is to: Home               Anticipated d/c date is: 3 days              Patient currently is not medically stable to d/c.  Diet Order            Diet renal with fluid restriction Fluid restriction: 1200 mL Fluid; Room service appropriate? Yes; Fluid consistency: Thin  Diet effective now              Body mass index is 28.05 kg/m. Consultants: Nephrology Procedures:see note Microbiology:see note  Medications: Scheduled Meds: . aspirin EC  81 mg Oral Daily  . famotidine  20 mg Oral Daily  . furosemide  80 mg Intravenous Q12H  . gabapentin  100 mg Oral BID  . heparin  5,000 Units Subcutaneous Q8H  . latanoprost  1 drop Left Eye QHS  . midodrine  5 mg Oral BID WC  . [START ON 07/09/2019] pneumococcal 23 valent vaccine  0.5 mL Intramuscular Tomorrow-1000  . predniSONE  5 mg Oral Daily  . tacrolimus  2 mg Oral QHS  . tacrolimus  3 mg Oral q morning - 10a  . [START ON 07/09/2019] Vitamin D (Ergocalciferol)  50,000 Units Oral Q Sun   Continuous Infusions: . sodium chloride      Antimicrobials: Anti-infectives (From admission, onward)   None       Objective: Vitals: Today's Vitals   07/07/19 1953 07/08/19 0016 07/08/19 0356 07/08/19 0815  BP: 120/67  (!) 102/55 (!) 98/54  Pulse: 60  (!) 57 61  Resp: 16  15 18   Temp: 98 F (36.7 C)  97.9 F (36.6 C) 98.7 F (37.1 C)  TempSrc: Oral  Oral Oral  SpO2: 95%  97% 99%  Weight: 63 kg     Height: 4\' 11"  (1.499 m)     PainSc: 8  7   2      Intake/Output Summary (Last 24 hours) at 07/08/2019 0954 Last data filed at 07/08/2019 0500 Gross per 24 hour  Intake 710.33 ml  Output 700 ml  Net 10.33 ml   Filed Weights   07/07/19 0940 07/07/19 1953  Weight: 62.6 kg 63 kg   Weight change:    Intake/Output from previous day: 06/04 0701 - 06/05 0700 In: 710.3 [P.O.:360; I.V.:35.3; Blood:315] Out: 700 [Urine:700] Intake/Output this shift: No intake/output data recorded.  Examination:  General exam: AAOx3 ,NAD, weak appearing. HEENT:Oral mucosa  moist, Ear/Nose WNL grossly,dentition normal. Respiratory system: bilaterally basal crackles,no wheezing or crackles,no use of accessory muscle, non tender. Cardiovascular system: S1 & S2 +, regular, No JVD. Gastrointestinal system: Abdomen soft, NT,ND, BS+. Nervous System:Alert, awake, moving extremities and grossly nonfocal Extremities: b/l ankle edema, distal peripheral pulses palpable.  Skin: No rashes,no icterus. MSK: Normal muscle bulk,tone, power  Data Reviewed: I have personally reviewed following labs and imaging studies CBC: Recent Labs  Lab 07/07/19 0918 07/07/19 1033 07/08/19 0350  WBC  --  1.9* 1.5*  NEUTROABS  --  1.3*  --   HGB 7.7* 7.5* 8.2*  HCT  --  24.0* 25.7*  MCV  --  110.1* 103.2*  PLT  --  146* 962*   Basic Metabolic Panel: Recent Labs  Lab 07/07/19 1033 07/08/19 0350  NA 137 137  K 4.6 5.0  CL 105 106  CO2 20* 20*  GLUCOSE 84 82  BUN 80* 82*  CREATININE 5.82* 5.61*  CALCIUM 9.1 9.1   GFR: Estimated Creatinine Clearance: 9.6 mL/min (A) (by C-G formula based on SCr of 5.61 mg/dL (H)). Liver Function Tests: Recent Labs  Lab 07/07/19 1033  AST 30  ALT 14  ALKPHOS 125  BILITOT 1.4*  PROT 5.9*  ALBUMIN 3.5   Recent Labs  Lab 07/07/19 1033  LIPASE 44   No results for input(s): AMMONIA in the last 168 hours. Coagulation Profile: No results for input(s): INR, PROTIME in the last 168 hours. Cardiac Enzymes: No results for input(s): CKTOTAL, CKMB, CKMBINDEX, TROPONINI in the last 168 hours. BNP (last 3 results) No results for input(s): PROBNP in the last 8760 hours. HbA1C: No results for input(s): HGBA1C in the last 72 hours. CBG: No results for input(s): GLUCAP in the last 168 hours. Lipid Profile: No results for input(s): CHOL, HDL, LDLCALC, TRIG, CHOLHDL, LDLDIRECT in the last 72 hours. Thyroid Function Tests: No results for input(s): TSH, T4TOTAL, FREET4, T3FREE, THYROIDAB in the last 72 hours. Anemia Panel: No results for  input(s): VITAMINB12, FOLATE, FERRITIN, TIBC, IRON, RETICCTPCT in the last 72 hours. Sepsis Labs: No results for input(s): PROCALCITON, LATICACIDVEN in the last 168 hours.  No results found for this or any previous visit (from the past 240 hour(s)).    Radiology Studies: DG Chest 1 View  Result Date: 07/07/2019 CLINICAL DATA:  Weakness EXAM: CHEST  1 VIEW COMPARISON:  05/16/2019 FINDINGS: Small left pleural effusion. Heart is mildly enlarged. Bilateral lower lobe airspace opacities. No acute bony abnormality. IMPRESSION: Bilateral lower lobe atelectasis or infiltrates. Small left effusion. Electronically Signed   By: Rolm Baptise M.D.   On: 07/07/2019 17:13     LOS: 1 day   Antonieta Pert, MD [facility]  07/08/2019, 9:54 AM

## 2019-07-08 NOTE — Consult Note (Signed)
[name]    HEMATOLOGY/ONCOLOGY CONSULTATION NOTE  Date of Service: 07/08/2019  Patient Care Team: [name], FNP as PCP - General (Family Medicine) O'Neal, Cassie Freer, MD as PCP - Cardiology (Cardiology)  CHIEF COMPLAINTS/PURPOSE OF CONSULTATION:  Pancytopenia  HISTORY OF PRESENTING ILLNESS:   Tina Watkins is al 54 y.o. female who has been referred to [name] by Dr Antonieta Pert, MD  for evaluation and management of pancytopenia in a patient with kidney transplantation on chronic immunosuppressive treatment and recent COVID 19 infection.  Patient has multiple medical comorbidities end-stage kidney disease status post kidney transplant more than 10 years ago on chronic immunosuppressive therapies with Imuran Prograf and prednisone, CHF, gastroparesis, CVA, visual impairment, chronic anemia due to chronic kidney disease who notes that she has required recurrent transfusions for anemia and has also had IV iron in the past. Patient notes that she is on Aranesp and has been on other ESA's in the past. She notes having COVID-19 infection in April of this year. She notes that her kidney function has been worsening over the last few months and this was thought to be due to overdiuresis and Covid infection among other etiologies.  Patient was admitted with increasing shortness of breath, dyspnea on exertion and was noted to have acute on chronic anemia.  Labs also showed increase in leukopenia and some mild thrombocytopenia. She was noted to have variable Prograf levels uncertain if this were related to changing renal function or inappropriately drawn trough levels. Her dose of Imuran was continued and this could have been toxic in the setting of worsening renal function.  Currently held on admission.  Hematology was consulted to provide input regarding her cytopenias.  Patient notes no fevers no chills no night sweats.  No unexpected weight loss. No other acute new focal  symptoms. Denies any new lumps or bumps or new masses.  MEDICAL HISTORY:  Past Medical History:  Diagnosis Date  . Bacteremia due to Gram-negative bacteria 05/23/2011  . Blind    right eye  . Blind right eye   . CHF (congestive heart failure) ([place])   . Chronic lower back pain   . Complication of anesthesia    "woke up during OR; I have an extremely high tolerance" (12/11/2011) 1 procedure was graft; the other procedures were procedures that are typically done with sedation.  . DDD (degenerative disc disease), cervical   . Depression   . Dysrhythmia    "tachycardia" (12/11/2011) new onset afib 10/15/14 EKG  . E coli bacteremia 06/18/2011  . Elevated LDL cholesterol level 12/2018  . ESRD (end stage renal disease) ([place]) 06/12/2011  . Fibromyalgia   . Gastroparesis   . Gastroparesis   . GERD (gastroesophageal reflux disease)   . Glaucoma    right eye  . Gout   . H/O carpal tunnel syndrome   . Headache(784.0)    "not often anymore" (12/11/2011)  . Herpes genitalia 1994  . History of blood transfusion    "more than a few times" (12/11/2011)  . History of stomach ulcers   . Hypotension   . Iron deficiency anemia   . New onset a-fib ([place])    10/15/14 EKG  . Osteopenia   . Peripheral neuropathy 11/2018  . Seizures ([place]) 1994   "post transplant; only have had that one" (12/11/2011)  . Spinal stenosis in cervical region   . Stroke [facility])     left basal ganglia lacunar infarct; Right frontal lobe lacunar infarct.  . Stroke ([place]) ~  1999; 2001   "briefly lost my vision; lost my right eye" (12/11/2011)  . Vitamin D deficiency 12/2018    SURGICAL HISTORY: Past Surgical History:  Procedure Laterality Date  . ANTERIOR CERVICAL DECOMP/DISCECTOMY FUSION N/A 01/08/2015   Procedure: Anterior Cervical Three-Four/Four-Five Decompression/Diskectomy/Fusion;  Surgeon: Leeroy Cha, MD;  Location: [facility];  Service: Neurosurgery;  Laterality: N/A;  C3-4 C4-5 Anterior cervical  decompression/diskectomy/fusion  . APPENDECTOMY  ~ 2004  . CATARACT EXTRACTION     right eye  . COLONOSCOPY    . ENUCLEATION  2001   "right"  . ESOPHAGOGASTRODUODENOSCOPY (EGD) WITH PROPOFOL N/A 04/21/2012   Procedure: ESOPHAGOGASTRODUODENOSCOPY (EGD) WITH PROPOFOL;  Surgeon: Milus Banister, MD;  Location: [facility];  Service: Endoscopy;  Laterality: N/A;  . 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 [place]; 1999; 2005   "right"  . MULTIPLE TOOTH EXTRACTIONS    . RIGHT HEART CATH N/A 03/23/2019   Procedure: RIGHT HEART CATH;  Surgeon: Jolaine Artist, MD;  Location: [facility];  Service: Cardiovascular;  Laterality: N/A;  . TONSILLECTOMY    . TOTAL NEPHRECTOMY  1988?; 1994; 2005    SOCIAL HISTORY: Social History   Socioeconomic History  . Marital status: Single    Spouse name: Not on file  . Number of children: 0  . Years of education: some college  . Highest education level: Not on file  Occupational History  . Occupation: disabled    Fish farm manager: UNEMPLOYED  Tobacco Use  . Smoking status: Current Every Day Smoker    Packs/day: 0.50    Years: 35.00    Pack years: 17.50    Types: Cigarettes  . Smokeless tobacco: Never Used  Substance and Sexual Activity  . Alcohol use: Yes    Comment: rare  . Drug use: Yes    Types: Marijuana    Comment: occasional use  . Sexual activity: Not Currently  Other Topics Concern  . Not on file  Social History Narrative   Right-handed.   Four cups caffeine per day.   Lives alone.   Social Determinants of Health   Financial Resource Strain:   . Difficulty of Paying Living Expenses:   Food Insecurity:   . Worried About Charity fundraiser in the Last Year:   . Arboriculturist in the Last Year:   Transportation Needs:   . Film/video editor (Medical):   [name] Lack of Transportation (Non-Medical):   Physical Activity:   . Days of Exercise per  Week:   . Minutes of Exercise per Session:   Stress:   . Feeling of Stress :   Social Connections:   . Frequency of Communication with Friends and Family:   . Frequency of Social Gatherings with Friends and Family:   . Attends Religious Services:   . Active Member of Clubs or Organizations:   . Attends Archivist Meetings:   [name] Marital Status:   Intimate Partner Violence:   . Fear of Current or Ex-Partner:   . Emotionally Abused:   [name] Physically Abused:   . Sexually Abused:     FAMILY HISTORY: Family History  Problem Relation Age of Onset  . Glaucoma Mother   . Pancreatic cancer Father   . Multiple sclerosis Brother   . Hypertension Maternal Grandmother   . Breast cancer Neg Hx     ALLERGIES:  is  allergic to levofloxacin and tape.  MEDICATIONS:  Current Facility-Administered Medications  Medication Dose Route Frequency Provider Last Rate Last Admin  . albuterol (PROVENTIL) (2.5 MG/3ML) 0.083% nebulizer solution 2.5 mg  2.5 mg Inhalation QID PRN Wynetta Fines T, MD      . aspirin EC tablet 81 mg  81 mg Oral Daily Wynetta Fines T, MD   81 mg at 07/08/19 0941  . diphenhydrAMINE (BENADRYL) capsule 25 mg  25 mg Oral Q6H PRN Kc, Ramesh, MD      . diphenhydrAMINE (BENADRYL) injection 12.5 mg  12.5 mg Intravenous Q8H PRN Kc, Ramesh, MD      . famotidine (PEPCID) tablet 20 mg  20 mg Oral Daily Wynetta Fines T, MD   20 mg at 07/07/19 2139  . fluticasone (FLONASE) 50 MCG/ACT nasal spray 1 spray  1 spray Each Nare Daily PRN Wynetta Fines T, MD      . furosemide (LASIX) injection 80 mg  80 mg Intravenous Q12H Rosita Fire, MD   80 mg at 07/08/19 7893  . gabapentin (NEURONTIN) capsule 100 mg  100 mg Oral BID Wynetta Fines T, MD   100 mg at 07/08/19 0941  . heparin injection 5,000 Units  5,000 Units Subcutaneous Q8H Lequita Halt, MD   5,000 Units at 07/08/19 [phone]  . latanoprost (XALATAN) 0.005 % ophthalmic solution 1 drop  1 drop Left Eye QHS Lequita Halt, MD   1 drop at 07/07/19  2317  . lidocaine (LIDODERM) 5 % 1 patch  1 patch Transdermal Daily PRN Wynetta Fines T, MD      . midodrine (PROAMATINE) tablet 5 mg  5 mg Oral BID WC Rosita Fire, MD   5 mg at 07/08/19 0941  . ondansetron (ZOFRAN) injection 4 mg  4 mg Intravenous Q6H PRN Antonieta Pert, MD   4 mg at 07/08/19 1149  . oxyCODONE-acetaminophen (PERCOCET/ROXICET) 5-325 MG per tablet 1 tablet  1 tablet Oral Q6H PRN Wynetta Fines T, MD   1 tablet at 07/08/19 1054   And  . oxyCODONE (Oxy IR/ROXICODONE) immediate release tablet 5 mg  5 mg Oral Q6H PRN Wynetta Fines T, MD   5 mg at 07/08/19 1054  . phenol (CHLORASEPTIC) mouth spray 1 spray  1 spray Mouth/Throat PRN Wynetta Fines T, MD      . Derrill Memo ON 07/09/2019] pneumococcal 23 valent vaccine (PNEUMOVAX-23) injection 0.5 mL  0.5 mL Intramuscular Tomorrow-1000 Wynetta Fines T, MD      . predniSONE (DELTASONE) tablet 5 mg  5 mg Oral Daily Rosita Fire, MD   5 mg at 07/08/19 0941  . sodium chloride 0.9 % bolus 500 mL  500 mL Intravenous Once Veryl Speak, MD      . tacrolimus (PROGRAF) capsule 2 mg  2 mg Oral QHS Wynetta Fines T, MD   2 mg at 07/07/19 2316  . tacrolimus (PROGRAF) capsule 3 mg  3 mg Oral q morning - 10a Lequita Halt, MD   3 mg at 07/08/19 0940  . [START ON 07/09/2019] Vitamin D (Ergocalciferol) (DRISDOL) capsule 50,000 Units  50,000 Units Oral Q Kathaleen Grinder, Pearletha Forge T, MD      . zolpidem [facility]) tablet 5 mg  5 mg Oral QHS PRN Lequita Halt, MD        REVIEW OF SYSTEMS:    10 Point review of Systems was done is negative except as noted above.  PHYSICAL EXAMINATION: ECOG PERFORMANCE STATUS: 2 - Symptomatic, <50% confined to  bed  . Vitals:   07/08/19 0356 07/08/19 0815  BP: (!) 102/55 (!) 98/54  Pulse: (!) 57 61  Resp: 15 18  Temp: 97.9 F (36.6 C) 98.7 F (37.1 C)  SpO2: 97% 99%   Filed Weights   07/07/19 0940 07/07/19 1953  Weight: 138 lb (62.6 kg) 138 lb 14.2 oz (63 kg)   .Body mass index is 28.05 kg/m.  GENERAL:alert, in no acute  distress and comfortable.  Facial swelling noted. SKIN: no acute rashes, no significant lesions EYES: conjunctiva are pink and non-injected, sclera anicteric OROPHARYNX: MMM, no exudates, no oropharyngeal erythema or ulceration NECK: supple, no JVD LYMPH:  no palpable lymphadenopathy in the cervical, axillary or inguinal regions LUNGS: clear to auscultation b/l with normal respiratory effort HEART: regular rate & rhythm ABDOMEN:  normoactive bowel sounds , non tender, not distended. Extremity: Bilateral 2+ pedal edema NEURO: Alert oriented x4  LABORATORY DATA:  I have reviewed the data as listed  . CBC Latest Ref Rng & Units 07/08/2019 07/07/2019 07/07/2019  WBC 4.0 - 10.5 K/uL 1.5(L) 1.9(L) -  Hemoglobin 12.0 - 15.0 g/dL 8.2(L) 7.5(L) 7.7(L)  Hematocrit 36.0 - 46.0 % 25.7(L) 24.0(L) -  Platelets 150 - 400 K/uL 135(L) 146(L) -    . CMP Latest Ref Rng & Units 07/08/2019 07/07/2019 06/07/2019  Glucose 70 - 99 mg/dL 82 84 81  BUN 6 - 20 mg/dL 82(H) 80(H) 46(H)  Creatinine 0.44 - 1.00 mg/dL 5.61(H) 5.82(H) 3.49(H)  Sodium 135 - 145 mmol/L 137 137 138  Potassium 3.5 - 5.1 mmol/L 5.0 4.6 4.4  Chloride 98 - 111 mmol/L 106 105 104  CO2 22 - 32 mmol/L 20(L) 20(L) 21(L)  Calcium 8.9 - 10.3 mg/dL 9.1 9.1 9.0  Total Protein 6.5 - 8.1 g/dL - 5.9(L) 6.1(L)  Total Bilirubin 0.3 - 1.2 mg/dL - 1.4(H) 1.4(H)  Alkaline Phos 38 - 126 U/L - 125 140(H)  AST 15 - 41 U/L - 30 27  ALT 0 - 44 U/L - 14 18     RADIOGRAPHIC STUDIES: I have personally reviewed the radiological images as listed and agreed with the findings in the report. DG Chest 1 View  Result Date: 07/07/2019 CLINICAL DATA:  Weakness EXAM: CHEST  1 VIEW COMPARISON:  05/16/2019 FINDINGS: Small left pleural effusion. Heart is mildly enlarged. Bilateral lower lobe airspace opacities. No acute bony abnormality. IMPRESSION: Bilateral lower lobe atelectasis or infiltrates. Small left effusion. Electronically Signed   By: Rolm Baptise M.D.   On:  07/07/2019 17:13    ASSESSMENT & PLAN:   54 year old female with multiple medical comorbidities as noted with   #1 Anemia This is primarily related to her anemia of chronic kidney disease. Additionally the macrocytic picture with anemia is likely related to her Imuran. No overt report of GI or other bleeding. If there is any concern for kidney allograft rejection this can cause a fair amount of inflammation and cause a relative resistance to ESA's and worsening anemia.  This is deemed less likely since her urinalysis is benign and nephrology is not particularly concerned with this.  #2 leukopenia with both neutropenia and lymphopenia. This appears to be primarily driven by her antirejection medications. Imuran dose would have potentially increasing effect in the setting of worsening renal function. Prograf levels appear to be fairly variable cannot rule out previous toxic dose levels.. Though nephrology believes the elevated Prograf levels were due to incorrectly timed blood withdrawal for trough levels.  The chronic immune suppression can increase  the patient's risk of lymphomas and other malignancies patient does not have any focal symptomatology to suggest this.  #3 mild thrombocytopenia Likely from Imuran and Prograf. Plan  -Discussed all the lab findings in details with the patient. -We discussed that the worsening cytopenias are likely in the setting of Imuran toxicity given worsening renal function and dose of accumulation. -Tacrolimus good.  Additional factor dose that seems less likely. -Recent Covid infection could also cause some bone marrow suppression and contribute some to the cytopenias.  Patient notes that her COVID-19 infection was relatively mild. -Patient is now appropriately of Imuran.  Cytopenias can take several weeks to completely resolve after Imuran cessation. -Ordered other work-up for pancytopenia including B12, copper levels, folate, HIV, hepatitis C,  hepatitis B surface antigen to rule out other etiologies of cytopenias. -No evidence of lymphomas or other secondary malignancies in the setting of chronic immune suppression at this time. -Will defer IV iron replacement and erythropoietin stimulating agent therapies to nephrology. -If cytopenias worsen or persist over the next 2 to 4 weeks despite being off Imuran might need to get a bone marrow biopsy to rule out other etiologies of her leukopenia. -Appreciate excellent hospital medicine and nephrology cares. -Thank you for this interesting consultation -Will see the patient in clinic on an as-needed basis if cytopenias persist.  All of the patients questions were answered with apparent satisfaction. The patient knows to call the clinic with any problems, questions or concerns.  I spent 50 minutes counseling the patient face to face. The total time spent in the appointment was 80 minutes and more than 50% was on counseling and direct patient cares and coordination of care with hospital medicine    Sullivan Lone MD [place] AAHIVMS [facility] Hematology/Oncology Physician [facility]  (Office):       [phone] (Work cell):  [phone] (Fax):           [phone]

## 2019-07-09 LAB — CBC WITH DIFFERENTIAL/PLATELET
Abs Immature Granulocytes: 0.01 10*3/uL (ref 0.00–0.07)
Basophils Absolute: 0 10*3/uL (ref 0.0–0.1)
Basophils Relative: 0 %
Eosinophils Absolute: 0.1 10*3/uL (ref 0.0–0.5)
Eosinophils Relative: 3 %
HCT: 25.5 % — ABNORMAL LOW (ref 36.0–46.0)
Hemoglobin: 8.1 g/dL — ABNORMAL LOW (ref 12.0–15.0)
Immature Granulocytes: 1 %
Lymphocytes Relative: 25 %
Lymphs Abs: 0.4 10*3/uL — ABNORMAL LOW (ref 0.7–4.0)
MCH: 32.9 pg (ref 26.0–34.0)
MCHC: 31.8 g/dL (ref 30.0–36.0)
MCV: 103.7 fL — ABNORMAL HIGH (ref 80.0–100.0)
Monocytes Absolute: 0.1 10*3/uL (ref 0.1–1.0)
Monocytes Relative: 3 %
Neutro Abs: 1 10*3/uL — ABNORMAL LOW (ref 1.7–7.7)
Neutrophils Relative %: 68 %
Platelets: 125 10*3/uL — ABNORMAL LOW (ref 150–400)
RBC: 2.46 MIL/uL — ABNORMAL LOW (ref 3.87–5.11)
RDW: 23.7 % — ABNORMAL HIGH (ref 11.5–15.5)
WBC: 1.5 10*3/uL — ABNORMAL LOW (ref 4.0–10.5)
nRBC: 2.1 % — ABNORMAL HIGH (ref 0.0–0.2)

## 2019-07-09 LAB — RENAL FUNCTION PANEL
Albumin: 3.4 g/dL — ABNORMAL LOW (ref 3.5–5.0)
Anion gap: 15 (ref 5–15)
BUN: 84 mg/dL — ABNORMAL HIGH (ref 6–20)
CO2: 19 mmol/L — ABNORMAL LOW (ref 22–32)
Calcium: 9.3 mg/dL (ref 8.9–10.3)
Chloride: 105 mmol/L (ref 98–111)
Creatinine, Ser: 5.33 mg/dL — ABNORMAL HIGH (ref 0.44–1.00)
GFR calc Af Amer: 10 mL/min — ABNORMAL LOW (ref >60)
GFR calc non Af Amer: 9 mL/min — ABNORMAL LOW (ref >60)
Glucose, Bld: 80 mg/dL (ref 70–99)
Phosphorus: 6 mg/dL — ABNORMAL HIGH (ref 2.5–4.6)
Potassium: 5.3 mmol/L — ABNORMAL HIGH (ref 3.5–5.1)
Sodium: 139 mmol/L (ref 135–145)

## 2019-07-09 LAB — CBC
HCT: 25.2 % — ABNORMAL LOW (ref 36.0–46.0)
Hemoglobin: 8.1 g/dL — ABNORMAL LOW (ref 12.0–15.0)
MCH: 32.9 pg (ref 26.0–34.0)
MCHC: 32.1 g/dL (ref 30.0–36.0)
MCV: 102.4 fL — ABNORMAL HIGH (ref 80.0–100.0)
Platelets: 121 10*3/uL — ABNORMAL LOW (ref 150–400)
RBC: 2.46 MIL/uL — ABNORMAL LOW (ref 3.87–5.11)
RDW: 23.8 % — ABNORMAL HIGH (ref 11.5–15.5)
WBC: 1.4 10*3/uL — CL (ref 4.0–10.5)
nRBC: 1.4 % — ABNORMAL HIGH (ref 0.0–0.2)

## 2019-07-09 MED ORDER — METOCLOPRAMIDE HCL 5 MG PO TABS
5.0000 mg | ORAL_TABLET | Freq: Every day | ORAL | Status: DC | PRN
  Administered 2019-07-09: 5 mg via ORAL
  Filled 2019-07-09: qty 1

## 2019-07-09 MED ORDER — FUROSEMIDE 10 MG/ML IJ SOLN
80.0000 mg | Freq: Three times a day (TID) | INTRAMUSCULAR | Status: DC
  Administered 2019-07-09 – 2019-07-11 (×6): 80 mg via INTRAVENOUS
  Filled 2019-07-09 (×7): qty 8

## 2019-07-09 MED ORDER — SEVELAMER CARBONATE 800 MG PO TABS
800.0000 mg | ORAL_TABLET | Freq: Three times a day (TID) | ORAL | Status: DC
  Administered 2019-07-09 – 2019-07-17 (×18): 800 mg via ORAL
  Filled 2019-07-09 (×19): qty 1

## 2019-07-09 MED ORDER — SODIUM ZIRCONIUM CYCLOSILICATE 10 G PO PACK
10.0000 g | PACK | Freq: Once | ORAL | Status: AC
  Administered 2019-07-09: 10 g via ORAL
  Filled 2019-07-09: qty 1

## 2019-07-09 MED ORDER — ACETAMINOPHEN 325 MG PO TABS
650.0000 mg | ORAL_TABLET | Freq: Four times a day (QID) | ORAL | Status: DC | PRN
  Administered 2019-07-09 – 2019-07-12 (×4): 650 mg via ORAL
  Filled 2019-07-09 (×4): qty 2

## 2019-07-09 MED ORDER — OXYCODONE HCL 5 MG PO TABS
10.0000 mg | ORAL_TABLET | Freq: Four times a day (QID) | ORAL | Status: DC | PRN
  Administered 2019-07-09 – 2019-07-16 (×11): 10 mg via ORAL
  Filled 2019-07-09 (×11): qty 2

## 2019-07-09 NOTE — Progress Notes (Addendum)
[facility] NEPHROLOGY PROGRESS NOTE  Assessment/ Plan: Pt is a 54 y.o. yo female  with history of ESRD status post kidney transplant, CKD 4 followed by Dr. Alric Quan, TIA, recent prolonged hospitalization for Covid pneumonia when she developed AKI on CKD, presented with generalized weakness, decreased oral intake and worsening anemia, seen as a consultation for AKI on CKD.  #Acute kidney injury on CKD versus progressive CKD IV: The serum creatinine level has been worsened since she had Covid infection in 05/2019.  Now she has fluid overload.  Acute rise in creatinine probably because of decreased oral intake and anemia. Urinalysis is bland.  Recent kidney ultrasound/Doppler was normal in 05/2019. Nonoliguric and creatinine level trending down.  Still volume up therefore increase Lasix 80 mg IV to 3 times daily. Monitor labs and urine output and assess daily for dialysis need.    No need for dialysis today however it might be coming very soon.  Discussed with the patient and she agreed.  #Kidney transplant status: Resume Prograf and prednisone.  No need to check Prograf level since the Prograf level were variable in the past because of no accurate 12 hours trough. Holding Imuran because of neutropenia.  #Anemia of CKD, symptomatic: Saturation 83%.  Received Epogen on 6/4.  Receiving a unit of blood transfusion in ER.  Hemoglobin 8.2.  #Hypotension/volume: Continue midodrine.  Diuretics as above to manage volume.  #CKD-MBD: Phosphorus level elevated therefore start Renvela.  #Mild hyperkalemia: On diuretics as above.  I will order a dose of Lokelma.  #Pancytopenia: Neutropenia worsen.  Continue to hold Imuran.  Seen by hematologist, may need Neupogen.  Will defer to hematology team.  Discussed with primary team.  Subjective: Seen and examined at bedside.  Urine output is recorded 2 L.  Denies nausea vomiting chest pain shortness of breath.  No  dysgeusia.  Objective Vital signs in last 24 hours: Vitals:   07/08/19 1942 07/09/19 0413 07/09/19 0700 07/09/19 0906  BP: (!) 97/58 100/67 103/69   Pulse: 70 64 63   Resp: 16 17 16    Temp: 98.8 F (37.1 C) 98.3 F (36.8 C) 98.5 F (36.9 C)   TempSrc: Oral Oral Oral   SpO2: 98% 96% 97% 97%  Weight:      Height:       Weight change:   Intake/Output Summary (Last 24 hours) at 07/09/2019 1046 Last data filed at 07/09/2019 0900 Gross per 24 hour  Intake 240 ml  Output 2200 ml  Net -1960 ml       Labs: Basic Metabolic Panel: Recent Labs  Lab 07/07/19 1033 07/08/19 0350 07/09/19 0312  NA 137 137 139  K 4.6 5.0 5.3*  CL 105 106 105  CO2 20* 20* 19*  GLUCOSE 84 82 80  BUN 80* 82* 84*  CREATININE 5.82* 5.61* 5.33*  CALCIUM 9.1 9.1 9.3  PHOS  --   --  6.0*   Liver Function Tests: Recent Labs  Lab 07/07/19 1033 07/09/19 0312  AST 30  --   ALT 14  --   ALKPHOS 125  --   BILITOT 1.4*  --   PROT 5.9*  --   ALBUMIN 3.5 3.4*   Recent Labs  Lab 07/07/19 1033  LIPASE 44   No results for input(s): AMMONIA in the last 168 hours. CBC: Recent Labs  Lab 07/07/19 1033 07/08/19 0350 07/09/19 0312  WBC 1.9* 1.5* 1.4*  NEUTROABS 1.3*  --   --   HGB 7.5* 8.2* 8.1*  HCT 24.0* 25.7* 25.2*  MCV 110.1* 103.2* 102.4*  PLT 146* 135* 121*   Cardiac Enzymes: No results for input(s): CKTOTAL, CKMB, CKMBINDEX, TROPONINI in the last 168 hours. CBG: No results for input(s): GLUCAP in the last 168 hours.  Iron Studies: No results for input(s): IRON, TIBC, TRANSFERRIN, FERRITIN in the last 72 hours. Studies/Results: DG Chest 1 View  Result Date: 07/07/2019 CLINICAL DATA:  Weakness EXAM: CHEST  1 VIEW COMPARISON:  05/16/2019 FINDINGS: Small left pleural effusion. Heart is mildly enlarged. Bilateral lower lobe airspace opacities. No acute bony abnormality. IMPRESSION: Bilateral lower lobe atelectasis or infiltrates. Small left effusion. Electronically Signed   By: Rolm Baptise  M.D.   On: 07/07/2019 17:13    Medications: Infusions: . sodium chloride      Scheduled Medications: . aspirin EC  81 mg Oral Daily  . famotidine  20 mg Oral Daily  . furosemide  80 mg Intravenous Q12H  . gabapentin  100 mg Oral BID  . heparin  5,000 Units Subcutaneous Q8H  . latanoprost  1 drop Left Eye QHS  . midodrine  5 mg Oral BID WC  . pneumococcal 23 valent vaccine  0.5 mL Intramuscular Tomorrow-1000  . predniSONE  5 mg Oral Daily  . tacrolimus  2 mg Oral QHS  . tacrolimus  3 mg Oral q morning - 10a  . Vitamin D (Ergocalciferol)  50,000 Units Oral Q Sun    have reviewed scheduled and prn medications.  Physical Exam: General:NAD, comfortable, able to lie flat Heart:RRR, s1s2 nl, no rubs Lungs:clear b/l, no crackle Abdomen:soft, Non-tender, non-distended Extremities: Bilateral upper and lower extremity edema+ improving Neurology: Alert awake and following commands, no asterixis  Tina Watkins Tina Watkins 07/09/2019,10:46 AM  LOS: 2 days  Pager: [phone]

## 2019-07-09 NOTE — Progress Notes (Signed)
PROGRESS NOTE    Tina Watkins  ZOX:[id number] DOB: 02-06-1965 DOA: 07/07/2019 PCP: [name], FNP   Chef Complaints: Feeling tired  Brief Narrative: 54 year old female with ESRD status post renal transplant complicated by CKD stage IV on chronic steroids and immunosuppressive therapy, chronic anemia secondary to CKD/myelosuppression from chemo on intermittent blood transfusion, regular epo inj by hematology, TIA, chronic diastolic CHF, moderate pulmonary hypertension presented with increasing fatigue and low hemoglobin level along with shortness of breath for last 1 week with exertion no fever no chills, seen by PCP and sent to the ED for transfusion.  Hemoglobin was 7.5.  Blood work also showed worsening renal function in the ED and seen by nephrology for possible dialysis Patient diuresed with IV Lasix  Subjective:  No acute events overnight.  Patient has been getting oxycodone as needed for pain control chronically on pain management at home with Percocet 10/325. Overnight afebrile, BUN stable creatinine slightly downtrending, potassium up at 5.3 this morning. Urine output  2000 ml past 24 hours. WBC further down and placed on neutropenic precaution.  Assessment & Plan:  Nonoliguric AKI on CKD stage IV versus progressive CKD 4: Admitted with worsening BUN/creatinine since Covid infection in April/2021, also with fluid overload and mild uremic symptoms. recent kidney ultrasound/Doppler was normal in April.  Seen by nephrology here, and IV Lasix with good urine output, creatinine slightly downtrending BUN remains up.  Continue plan of care as per nephrology.  Appreciate input.  Discussed with nephrology. Recent Labs  Lab 07/07/19 1033 07/08/19 0350 07/09/19 0312  BUN 80* 82* 84*  CREATININE 5.82* 5.61* 5.33*   Fluid overload in the setting of CKD:BNP slightly up 474.  Continue IV Lasix, -1.7  l balance with good uop. normaly wt around 112 lb and on admission 138.   Monitor weight.  Wt Readings from Last 3 Encounters:  07/07/19 63 kg  05/17/19 59 kg  05/08/19 52.3 kg   Symptomatic anemia with anemia of CKD with history of chronic anemia needing transfusion and on regular EPO injection by hematology.  Status post 1 unit PRBC hemoglobin is stable.  Monitor.   Recent Labs  Lab 07/07/19 0918 07/07/19 1033 07/08/19 0350 07/09/19 0312 07/09/19 1037  HGB 7.7* 7.5* 8.2* 8.1* 8.1*  HCT  --  24.0* 25.7* 25.2* 25.5*   Status post renal transplant on a steroid, tacrolimus, imuran.  Continue steroid and tacrolimus for now holding Imuran per nephro due to patient's pancytopenia.  Chronic hypotension:patient reports she normally runs from 81-19 systolic.continue her home midodrine.    Pancytopenia/neutropenia:in the setting of  Immunosuppression 2/2 imuran with worsening renal function, recent Covid infection which could cause marrow suppression.WBC count further low 1400.  Continue neutropenic precaution.  Dr. Irene Limbo from hematology has been consulted and had discussed with him yesterday.  ANC at 1000 today.  Work-up has been ordered by hematology with B12, copper level, folate, HIV, hepatitis C, hep B. Nephrology has adjusted transplant meds- imuran on hold. Tacrolimus level high on 4/7 at 28 but down to 15 on 4/11 (random lelvel).  Recent Labs  Lab 07/08/19 0350 07/09/19 0312 07/09/19 1037  HGB 8.2* 8.1* 8.1*  HCT 25.7* 25.2* 25.5*  WBC 1.5* 1.4* 1.5*  PLT 135* 121* 125*   Pulmonary hypertension/Chronic diastolic CHF: IV diuresis for fluid overload as above.  Monitor intake output and daily weight.  Chronic pain patient reports she takes oxycodone 10/325 mg at home 4-6 times dailly-continue oxycodone at 10 mg every 6 hour  as needed.  Requesting Reglan which she takes twice a day will adjust renally.  Continue Benadryl for itching when taking pain medication per patient request.  DVT prophylaxis:SCD/Heparin Code Status: Full code Family Communication:  plan of care discussed with patient at bedside.  Status ME:[id number] Remains inpatient appropriate because:IV treatments appropriate due to intensity of illness or inability to take PO, Inpatient level of care appropriate due to severity of illness and For ongoing management of fluid overload uremic symptoms with IV diuresis and monitoring of renal function closely  Dispo: The patient is from: Home             Anticipated d/c is to: Home             Anticipated d/c date is: 3 days             Patient currently is not medically stable to d/c.  Diet Order            Diet renal with fluid restriction Fluid restriction: 1200 mL Fluid; Room service appropriate? Yes; Fluid consistency: Thin  Diet effective now              Body mass index is 28.05 kg/m. Consultants: Nephrology Procedures:see note Microbiology:see note  Medications: Scheduled Meds: . aspirin EC  81 mg Oral Daily  . famotidine  20 mg Oral Daily  . furosemide  80 mg Intravenous Q8H  . gabapentin  100 mg Oral BID  . heparin  5,000 Units Subcutaneous Q8H  . latanoprost  1 drop Left Eye QHS  . midodrine  5 mg Oral BID WC  . pneumococcal 23 valent vaccine  0.5 mL Intramuscular Tomorrow-1000  . predniSONE  5 mg Oral Daily  . sevelamer carbonate  800 mg Oral TID WC  . sodium zirconium cyclosilicate  10 g Oral Once  . tacrolimus  2 mg Oral QHS  . tacrolimus  3 mg Oral q morning - 10a  . Vitamin D (Ergocalciferol)  50,000 Units Oral Q Sun   Continuous Infusions: . sodium chloride      Antimicrobials: Anti-infectives (From admission, onward)   None       Objective: Vitals: Today's Vitals   07/09/19 0413 07/09/19 0414 07/09/19 0700 07/09/19 0906  BP: 100/67  103/69   Pulse: 64  63   Resp: 17  16   Temp: 98.3 F (36.8 C)  98.5 F (36.9 C)   TempSrc: Oral  Oral   SpO2: 96%  97% 97%  Weight:      Height:      PainSc:  10-Worst pain ever      Intake/Output Summary (Last 24 hours) at 07/09/2019 1243 Last  data filed at 07/09/2019 0900 Gross per 24 hour  Intake 240 ml  Output 2200 ml  Net -1960 ml   Filed Weights   07/07/19 0940 07/07/19 1953  Weight: 62.6 kg 63 kg   Weight change:    Intake/Output from previous day: 06/05 0701 - 06/06 0700 In: 240 [P.O.:240] Out: 2000 [Urine:2000] Intake/Output this shift: Total I/O In: -  Out: 200 [Urine:200]  Examination:  General exam: AAOx3,NAD, weak appearing. HEENT:Oral mucosa moist, Ear/Nose WNL grossly, dentition normal. Respiratory system: bilaterally basal crackles, no wheezing, no use of accessory respiratory muscles.   Cardiovascular system: S1 & S2 +, No JVD,. Gastrointestinal system: Abdomen soft, NT,ND, BS+ Nervous System:Alert, awake, moving extremities and grossly nonfocal Extremities: Lower extremity edema present but improving with skin wrinkling noticeable  Skin: No rashes,no icterus. MSK: Normal  muscle bulk,tone, power  Data Reviewed: I have personally reviewed following labs and imaging studies CBC: Recent Labs  Lab 07/07/19 0918 07/07/19 1033 07/08/19 0350 07/09/19 0312 07/09/19 1037  WBC  --  1.9* 1.5* 1.4* 1.5*  NEUTROABS  --  1.3*  --   --  1.0*  HGB 7.7* 7.5* 8.2* 8.1* 8.1*  HCT  --  24.0* 25.7* 25.2* 25.5*  MCV  --  110.1* 103.2* 102.4* 103.7*  PLT  --  146* 135* 121* 027*   Basic Metabolic Panel: Recent Labs  Lab 07/07/19 1033 07/08/19 0350 07/09/19 0312  NA 137 137 139  K 4.6 5.0 5.3*  CL 105 106 105  CO2 20* 20* 19*  GLUCOSE 84 82 80  BUN 80* 82* 84*  CREATININE 5.82* 5.61* 5.33*  CALCIUM 9.1 9.1 9.3  PHOS  --   --  6.0*   GFR: Estimated Creatinine Clearance: 10.1 mL/min (A) (by C-G formula based on SCr of 5.33 mg/dL (H)). Liver Function Tests: Recent Labs  Lab 07/07/19 1033 07/09/19 0312  AST 30  --   ALT 14  --   ALKPHOS 125  --   BILITOT 1.4*  --   PROT 5.9*  --   ALBUMIN 3.5 3.4*   Recent Labs  Lab 07/07/19 1033  LIPASE 44   No results for input(s): AMMONIA in the last  168 hours. Coagulation Profile: No results for input(s): INR, PROTIME in the last 168 hours. Cardiac Enzymes: No results for input(s): CKTOTAL, CKMB, CKMBINDEX, TROPONINI in the last 168 hours. BNP (last 3 results) No results for input(s): PROBNP in the last 8760 hours. HbA1C: No results for input(s): HGBA1C in the last 72 hours. CBG: No results for input(s): GLUCAP in the last 168 hours. Lipid Profile: No results for input(s): CHOL, HDL, LDLCALC, TRIG, CHOLHDL, LDLDIRECT in the last 72 hours. Thyroid Function Tests: No results for input(s): TSH, T4TOTAL, FREET4, T3FREE, THYROIDAB in the last 72 hours. Anemia Panel: Recent Labs    07/08/19 1446  VITAMINB12 619   Sepsis Labs: No results for input(s): PROCALCITON, LATICACIDVEN in the last 168 hours.  No results found for this or any previous visit (from the past 240 hour(s)).    Radiology Studies: DG Chest 1 View  Result Date: 07/07/2019 CLINICAL DATA:  Weakness EXAM: CHEST  1 VIEW COMPARISON:  05/16/2019 FINDINGS: Small left pleural effusion. Heart is mildly enlarged. Bilateral lower lobe airspace opacities. No acute bony abnormality. IMPRESSION: Bilateral lower lobe atelectasis or infiltrates. Small left effusion. Electronically Signed   By: Rolm Baptise M.D.   On: 07/07/2019 17:13     LOS: 2 days   Antonieta Pert, MD [facility]  07/09/2019, 12:43 PM

## 2019-07-09 NOTE — Plan of Care (Signed)
  Problem: Pain Managment: Goal: General experience of comfort will improve Outcome: Progressing   Problem: Safety: Goal: Ability to remain free from injury will improve Outcome: Progressing   Problem: Skin Integrity: Goal: Risk for impaired skin integrity will decrease Outcome: Progressing   

## 2019-07-09 NOTE — Progress Notes (Addendum)
Notified X. Blount on call for [facility] that CRITICAL LAB -WBC is 1.4 , HgB 8.1 this AM. RN will continue to monitor.    Addendum: Patient placed on Protective Precautions- No fresh fruit , uncooked food,or fresh flowers or plants  in patient's room and no visitors who are sick with fever, GI or respiratory symptoms. Patient notified and voiced understanding.  RN will continue to monitor.

## 2019-07-10 LAB — CBC WITH DIFFERENTIAL/PLATELET
Abs Immature Granulocytes: 0.01 10*3/uL (ref 0.00–0.07)
Basophils Absolute: 0 10*3/uL (ref 0.0–0.1)
Basophils Relative: 0 %
Eosinophils Absolute: 0 10*3/uL (ref 0.0–0.5)
Eosinophils Relative: 2 %
HCT: 24.2 % — ABNORMAL LOW (ref 36.0–46.0)
Hemoglobin: 7.9 g/dL — ABNORMAL LOW (ref 12.0–15.0)
Immature Granulocytes: 1 %
Lymphocytes Relative: 21 %
Lymphs Abs: 0.3 10*3/uL — ABNORMAL LOW (ref 0.7–4.0)
MCH: 33.3 pg (ref 26.0–34.0)
MCHC: 32.6 g/dL (ref 30.0–36.0)
MCV: 102.1 fL — ABNORMAL HIGH (ref 80.0–100.0)
Monocytes Absolute: 0.1 10*3/uL (ref 0.1–1.0)
Monocytes Relative: 4 %
Neutro Abs: 1 10*3/uL — ABNORMAL LOW (ref 1.7–7.7)
Neutrophils Relative %: 72 %
Platelets: 115 10*3/uL — ABNORMAL LOW (ref 150–400)
RBC: 2.37 MIL/uL — ABNORMAL LOW (ref 3.87–5.11)
RDW: 23.5 % — ABNORMAL HIGH (ref 11.5–15.5)
WBC: 1.3 10*3/uL — CL (ref 4.0–10.5)
nRBC: 2.3 % — ABNORMAL HIGH (ref 0.0–0.2)

## 2019-07-10 LAB — COMPREHENSIVE METABOLIC PANEL
ALT: 11 U/L (ref 0–44)
AST: 24 U/L (ref 15–41)
Albumin: 3.5 g/dL (ref 3.5–5.0)
Alkaline Phosphatase: 116 U/L (ref 38–126)
Anion gap: 12 (ref 5–15)
BUN: 85 mg/dL — ABNORMAL HIGH (ref 6–20)
CO2: 19 mmol/L — ABNORMAL LOW (ref 22–32)
Calcium: 9.1 mg/dL (ref 8.9–10.3)
Chloride: 106 mmol/L (ref 98–111)
Creatinine, Ser: 5.56 mg/dL — ABNORMAL HIGH (ref 0.44–1.00)
GFR calc Af Amer: 9 mL/min — ABNORMAL LOW (ref >60)
GFR calc non Af Amer: 8 mL/min — ABNORMAL LOW (ref >60)
Glucose, Bld: 80 mg/dL (ref 70–99)
Potassium: 4.7 mmol/L (ref 3.5–5.1)
Sodium: 137 mmol/L (ref 135–145)
Total Bilirubin: 1.5 mg/dL — ABNORMAL HIGH (ref 0.3–1.2)
Total Protein: 5.7 g/dL — ABNORMAL LOW (ref 6.5–8.1)

## 2019-07-10 LAB — FOLATE RBC
Folate, Hemolysate: 283 ng/mL
Folate, RBC: 1101 ng/mL (ref >498)
Hematocrit: 25.7 % — ABNORMAL LOW (ref 34.0–46.6)

## 2019-07-10 LAB — PHOSPHORUS: Phosphorus: 5.9 mg/dL — ABNORMAL HIGH (ref 2.5–4.6)

## 2019-07-10 MED ORDER — METOLAZONE 5 MG PO TABS
5.0000 mg | ORAL_TABLET | Freq: Two times a day (BID) | ORAL | Status: DC
  Administered 2019-07-10 – 2019-07-11 (×3): 5 mg via ORAL
  Filled 2019-07-10 (×6): qty 1

## 2019-07-10 MED ORDER — DARBEPOETIN ALFA 100 MCG/0.5ML IJ SOSY
100.0000 ug | PREFILLED_SYRINGE | INTRAMUSCULAR | Status: DC
  Administered 2019-07-10: 100 ug via SUBCUTANEOUS
  Filled 2019-07-10: qty 0.5

## 2019-07-10 NOTE — Progress Notes (Signed)
Patient ID: Tina Watkins, female   DOB: 1965-09-08, 54 y.o.   MRN: [id number] [facility] Progress Note   Assessment/ Plan:   1. Acute kidney Injury on chronic kidney disease stage IV T: With likely progression following significant acute kidney injury when she suffered COVID-19 infection 2 months ago.  The recent worsening of her renal function appears to be associated with decreased effective arterial blood volume from her anemia/decreased oral intake in the setting of interstitial volume overload.  With unimpressive urine output overnight with current diuresis-we will add metolazone for the next 24 hours and if urine output does not pick up and kidney function continues to worsen, hemodialysis will be started. 2.  Status post kidney transplant: Usually on immunosuppressive therapy with Prograf, Imuran and prednisone with Imuran currently on hold due to neutropenia. 3.  Anemia of chronic kidney disease: With adequate iron stores and status post PRBC transfusion however, downtrending H&H.  Begin darbepoetin. 4.  Hypotension: Challenging with ongoing diuresis, on midodrine. 5.  Secondary hyperparathyroidism: Calcium level acceptable with elevated phosphorus noted, monitor with low-dose sevelamer.  Subjective:   She is very tearful and frustrated with her declining renal function-she requests for "1 last attempt/adjustment of therapy before dialysis".   Objective:   BP (!) 93/53 (BP Location: Right Arm)   Pulse 68   Temp 98.2 F (36.8 C) (Oral)   Resp 20   Ht 4\' 11"  (1.499 m)   Wt 61.6 kg   SpO2 100%   BMI 27.43 kg/m   Intake/Output Summary (Last 24 hours) at 07/10/2019 1000 Last data filed at 07/10/2019 0445 Gross per 24 hour  Intake 360 ml  Output 500 ml  Net -140 ml   Weight change:   Physical Exam: Gen: Appears to be comfortable resting in bed, eating breakfast CVS: Pulse regular rhythm, normal rate, S1 and S2 normal Resp: Coarse breath sounds bilaterally  with fine rales Abd: Soft, obese, nontender Ext: Trace lower extremity edema  Imaging: No results found.  Labs: BMET Recent Labs  Lab 07/07/19 1033 07/08/19 0350 07/09/19 0312 07/10/19 0415  NA 137 137 139 137  K 4.6 5.0 5.3* 4.7  CL 105 106 105 106  CO2 20* 20* 19* 19*  GLUCOSE 84 82 80 80  BUN 80* 82* 84* 85*  CREATININE 5.82* 5.61* 5.33* 5.56*  CALCIUM 9.1 9.1 9.3 9.1  PHOS  --   --  6.0* 5.9*   CBC Recent Labs  Lab 07/07/19 1033 07/07/19 1033 07/08/19 0350 07/09/19 0312 07/09/19 1037 07/10/19 0415  WBC 1.9*   < > 1.5* 1.4* 1.5* 1.3*  NEUTROABS 1.3*  --   --   --  1.0* 1.0*  HGB 7.5*   < > 8.2* 8.1* 8.1* 7.9*  HCT 24.0*   < > 25.7* 25.2* 25.5* 24.2*  MCV 110.1*   < > 103.2* 102.4* 103.7* 102.1*  PLT 146*   < > 135* 121* 125* 115*   < > = values in this interval not displayed.    Medications:    . aspirin EC  81 mg Oral Daily  . famotidine  20 mg Oral Daily  . furosemide  80 mg Intravenous Q8H  . gabapentin  100 mg Oral BID  . heparin  5,000 Units Subcutaneous Q8H  . latanoprost  1 drop Left Eye QHS  . midodrine  5 mg Oral BID WC  . pneumococcal 23 valent vaccine  0.5 mL Intramuscular Tomorrow-1000  . predniSONE  5 mg Oral Daily  .  sevelamer carbonate  800 mg Oral TID WC  . tacrolimus  2 mg Oral QHS  . tacrolimus  3 mg Oral q morning - 10a  . Vitamin D (Ergocalciferol)  50,000 Units Oral Q Glennie Hawk, MD 07/10/2019, 10:00 AM

## 2019-07-10 NOTE — Progress Notes (Signed)
Dr. Lupita Leash notified that patient is complaining of very faint bloody/watery discharge/drainage from vaginal or rectum area and is requesting to have it investigated further.  Patient given pad and new pair of underwear.  Denied any pain.

## 2019-07-10 NOTE — Plan of Care (Signed)
  Problem: Pain Managment: Goal: General experience of comfort will improve Outcome: Progressing   Problem: Safety: Goal: Ability to remain free from injury will improve Outcome: Progressing   Problem: Skin Integrity: Goal: Risk for impaired skin integrity will decrease Outcome: Progressing   Problem: Education: Goal: Knowledge of disease and its progression will improve Outcome: Progressing   Problem: Fluid Volume: Goal: Compliance with measures to maintain balanced fluid volume will improve Outcome: Progressing   Problem: Nutritional: Goal: Ability to make healthy dietary choices will improve Outcome: Progressing

## 2019-07-10 NOTE — Plan of Care (Signed)

## 2019-07-10 NOTE — Progress Notes (Signed)
PROGRESS NOTE    Tina Watkins  TXM:[id number] DOB: [birth year] DOA: 07/07/2019 PCP: [name], FNP   Chef Complaints:  Feeling tired  Brief Narrative:  54 year old female with ESRD status post renal transplant complicated by CKD stage IV on chronic steroids and immunosuppressive therapy, chronic anemia secondary to CKD/myelosuppression from chemo on intermittent blood transfusion, regular epo inj by hematology, TIA, chronic diastolic CHF, moderate pulmonary hypertension presented with increasing fatigue and low hemoglobin level along with shortness of breath for last 1 week with exertion no fever no chills, seen by PCP and sent to the ED for transfusion.  Hemoglobin was 7.5.  Blood work also showed worsening renal function in the ED and seen by nephrology for possible dialysis Patient diuresed with IV Lasix  Subjective:  Patient reports not voiding well.  No other new complaints.   Urine output 700 mill, day before was 2000 ml WBC 1300, ANC 1000 Remains afebrile.  Assessment & Plan:  Nonoliguric AKI onc CKD IV versus progressive CKD stage IV: Likely progressing with significant acute injury when she had  COVID-19 infection 2 months ago,also with h fluid overload and mild uremic symptoms.Recent kidney ultrasound/Doppler was normal in April.  Challenging with IV diuresis, urine output borderline past 24 hours, added metolazone and monitoring closely if no significant urine output continue function if continues to worsen nephro planning for hemodialysis. Recent Labs  Lab 07/07/19 1033 07/08/19 0350 07/09/19 0312 07/10/19 0415  BUN 80* 82* 84* 85*  CREATININE 5.82* 5.61* 5.33* 5.56*   Fluid overload in the setting of CKD:BNP slightly up 474.  Leg edema is improved.  Urine output marginal continue to monitor weight and visit urine output.   Symptomatic anemia with anemia of CKD, Status post 1 unit PRBC hemoglobin is downtrending, starting darbepoetin, monitor H&H and  transfuse if needed  Recent Labs  Lab 07/07/19 1033 07/08/19 0350 07/09/19 0312 07/09/19 1037 07/10/19 0415  HGB 7.5* 8.2* 8.1* 8.1* 7.9*  HCT 24.0* 25.7* 25.2* 25.5* 24.2*   Status post renal transplant, immunosuppressive with a steroid, tacrolimus, imuran.  Imuran on hold due to cytopenia, continue steroid and tacrolimus.  Prograf level back in April was high but appears to be random level.  Continue plan as per nephrology.  Chronic hypotension:patient reports she normally runs from 48-25 systolic.continue her home midodrine.  Diuresis will be challenging due to her hypertension.  Pancytopenia/neutropenia: Suspect multifactorial, with use of Imuran, recent COVID-19 infection . Dr. Irene Limbo from hematology has been consulted and had discussed with him yesterday.  ANC at 1000 today.  Work-up has been ordered by hematology with B12, copper level, folate, HIV, hepatitis C, hep B.  Continue neutropenic precaution, monitor CBC closely.  No fever. Recent Labs  Lab 07/09/19 0312 07/09/19 1037 07/10/19 0415  HGB 8.1* 8.1* 7.9*  HCT 25.2* 25.5* 24.2*  WBC 1.4* 1.5* 1.3*  PLT 121* 125* 115*   Pulmonary hypertension/Chronic diastolic CHF: IV diuresis for fluid overload as above.  Monitor intake output and daily weight.  Chronic pain : Patient is on home oxycodone 10/325, continue the same.  Continue Benadryl for itching.    DVT prophylaxis:SCD/Heparin Code Status: Full code Family Communication: plan of care discussed with patient at bedside.  Status OI:[id number] Remains inpatient appropriate because:IV treatments appropriate due to intensity of illness or inability to take PO, Inpatient level of care appropriate due to severity of illness and For ongoing management of fluid overload uremic symptoms with IV diuresis and monitoring of renal function closely  and if renal function does not improve or worsens there is plan for potential dialysis initiation.  Dispo: The patient is from: Home              Anticipated d/c is to: Home             Anticipated d/c date is: 3 days             Patient currently is not medically stable to d/c.  Diet Order            Diet renal with fluid restriction Fluid restriction: 1200 mL Fluid; Room service appropriate? Yes; Fluid consistency: Thin  Diet effective now              Body mass index is 27.43 kg/m. Consultants: Nephrology Procedures:see note Microbiology:see note  Medications: Scheduled Meds: . aspirin EC  81 mg Oral Daily  . darbepoetin (ARANESP) injection - NON-DIALYSIS  100 mcg Subcutaneous Q Mon-1800  . famotidine  20 mg Oral Daily  . furosemide  80 mg Intravenous Q8H  . gabapentin  100 mg Oral BID  . heparin  5,000 Units Subcutaneous Q8H  . latanoprost  1 drop Left Eye QHS  . metolazone  5 mg Oral BID  . midodrine  5 mg Oral BID WC  . pneumococcal 23 valent vaccine  0.5 mL Intramuscular Tomorrow-1000  . predniSONE  5 mg Oral Daily  . sevelamer carbonate  800 mg Oral TID WC  . tacrolimus  2 mg Oral QHS  . tacrolimus  3 mg Oral q morning - 10a  . Vitamin D (Ergocalciferol)  50,000 Units Oral Q Sun   Continuous Infusions:   Antimicrobials: Anti-infectives (From admission, onward)   None       Objective: Vitals: Today's Vitals   07/10/19 0143 07/10/19 0443 07/10/19 0457 07/10/19 0854  BP:  100/68  (!) 93/53  Pulse:  63  68  Resp:  15  20  Temp:  98.5 F (36.9 C)  98.2 F (36.8 C)  TempSrc:  Oral  Oral  SpO2:  99%  100%  Weight:      Height:      PainSc: 8   8      Intake/Output Summary (Last 24 hours) at 07/10/2019 1112 Last data filed at 07/10/2019 0445 Gross per 24 hour  Intake 360 ml  Output 500 ml  Net -140 ml   Filed Weights   07/07/19 0940 07/07/19 1953 07/10/19 0128  Weight: 62.6 kg 63 kg 61.6 kg   Weight change:    Intake/Output from previous day: 06/06 0701 - 06/07 0700 In: 600 [P.O.:600] Out: 700 [Urine:700] Intake/Output this shift: No intake/output data  recorded.  Examination:  General exam: AAOx3 , older for age, NAD, weak appearing. HEENT:Oral mucosa moist, Ear/Nose WNL grossly, dentition normal. Respiratory system: bilaterally clear,no wheezing or crackles,no use of accessory muscle. Cardiovascular system: S1 & S2 +, No JVD. Gastrointestinal system: Abdomen soft, NT,ND, BS+ Nervous System:Alert, awake, moving extremities and grossly nonfocal Extremities: skin wrinkling + on LE,no edema  distal peripheral pulses palpable.  Skin: No rashes,no icterus. MSK: Normal muscle bulk,tone, power.  Data Reviewed: I have personally reviewed following labs and imaging studies CBC: Recent Labs  Lab 07/07/19 1033 07/08/19 0350 07/09/19 0312 07/09/19 1037 07/10/19 0415  WBC 1.9* 1.5* 1.4* 1.5* 1.3*  NEUTROABS 1.3*  --   --  1.0* 1.0*  HGB 7.5* 8.2* 8.1* 8.1* 7.9*  HCT 24.0* 25.7* 25.2* 25.5* 24.2*  MCV 110.1* 103.2*  102.4* 103.7* 102.1*  PLT 146* 135* 121* 125* 951*   Basic Metabolic Panel: Recent Labs  Lab 07/07/19 1033 07/08/19 0350 07/09/19 0312 07/10/19 0415  NA 137 137 139 137  K 4.6 5.0 5.3* 4.7  CL 105 106 105 106  CO2 20* 20* 19* 19*  GLUCOSE 84 82 80 80  BUN 80* 82* 84* 85*  CREATININE 5.82* 5.61* 5.33* 5.56*  CALCIUM 9.1 9.1 9.3 9.1  PHOS  --   --  6.0* 5.9*   GFR: Estimated Creatinine Clearance: 9.6 mL/min (A) (by C-G formula based on SCr of 5.56 mg/dL (H)). Liver Function Tests: Recent Labs  Lab 07/07/19 1033 07/09/19 0312 07/10/19 0415  AST 30  --  24  ALT 14  --  11  ALKPHOS 125  --  116  BILITOT 1.4*  --  1.5*  PROT 5.9*  --  5.7*  ALBUMIN 3.5 3.4* 3.5   Recent Labs  Lab 07/07/19 1033  LIPASE 44   No results for input(s): AMMONIA in the last 168 hours. Coagulation Profile: No results for input(s): INR, PROTIME in the last 168 hours. Cardiac Enzymes: No results for input(s): CKTOTAL, CKMB, CKMBINDEX, TROPONINI in the last 168 hours. BNP (last 3 results) No results for input(s): PROBNP in the  last 8760 hours. HbA1C: No results for input(s): HGBA1C in the last 72 hours. CBG: No results for input(s): GLUCAP in the last 168 hours. Lipid Profile: No results for input(s): CHOL, HDL, LDLCALC, TRIG, CHOLHDL, LDLDIRECT in the last 72 hours. Thyroid Function Tests: No results for input(s): TSH, T4TOTAL, FREET4, T3FREE, THYROIDAB in the last 72 hours. Anemia Panel: Recent Labs    07/08/19 1446  VITAMINB12 619   Sepsis Labs: No results for input(s): PROCALCITON, LATICACIDVEN in the last 168 hours.  No results found for this or any previous visit (from the past 240 hour(s)).    Radiology Studies: No results found.   LOS: 3 days   Antonieta Pert, MD [facility]  07/10/2019, 11:12 AM

## 2019-07-10 NOTE — Progress Notes (Addendum)
Notified X. Blount of Critical Lab- WBC 1.3 this AM.  Addendum: Hgb 7.9.  RN will continue to monitor

## 2019-07-11 ENCOUNTER — Inpatient Hospital Stay (HOSPITAL_COMMUNITY): Payer: Medicare Other

## 2019-07-11 ENCOUNTER — Encounter (HOSPITAL_COMMUNITY): Payer: Medicaid Other

## 2019-07-11 DIAGNOSIS — N184 Chronic kidney disease, stage 4 (severe): Secondary | ICD-10-CM

## 2019-07-11 DIAGNOSIS — N185 Chronic kidney disease, stage 5: Secondary | ICD-10-CM

## 2019-07-11 DIAGNOSIS — N171 Acute kidney failure with acute cortical necrosis: Secondary | ICD-10-CM

## 2019-07-11 DIAGNOSIS — D631 Anemia in chronic kidney disease: Secondary | ICD-10-CM

## 2019-07-11 DIAGNOSIS — Z940 Kidney transplant status: Secondary | ICD-10-CM

## 2019-07-11 DIAGNOSIS — D649 Anemia, unspecified: Secondary | ICD-10-CM

## 2019-07-11 HISTORY — PX: IR RADIOLOGY PERIPHERAL GUIDED IV START: IMG5598

## 2019-07-11 HISTORY — PX: IR FLUORO GUIDE CV LINE RIGHT: IMG2283

## 2019-07-11 HISTORY — PX: IR US GUIDE VASC ACCESS RIGHT: IMG2390

## 2019-07-11 LAB — HEPATITIS B SURFACE ANTIGEN: Hepatitis B Surface Ag: NONREACTIVE

## 2019-07-11 LAB — CBC WITH DIFFERENTIAL/PLATELET
Abs Immature Granulocytes: 0.02 10*3/uL (ref 0.00–0.07)
Basophils Absolute: 0 10*3/uL (ref 0.0–0.1)
Basophils Relative: 0 %
Eosinophils Absolute: 0.1 10*3/uL (ref 0.0–0.5)
Eosinophils Relative: 4 %
HCT: 24 % — ABNORMAL LOW (ref 36.0–46.0)
Hemoglobin: 7.7 g/dL — ABNORMAL LOW (ref 12.0–15.0)
Immature Granulocytes: 1 %
Lymphocytes Relative: 24 %
Lymphs Abs: 0.4 10*3/uL — ABNORMAL LOW (ref 0.7–4.0)
MCH: 33.2 pg (ref 26.0–34.0)
MCHC: 32.1 g/dL (ref 30.0–36.0)
MCV: 103.4 fL — ABNORMAL HIGH (ref 80.0–100.0)
Monocytes Absolute: 0.1 10*3/uL (ref 0.1–1.0)
Monocytes Relative: 4 %
Neutro Abs: 1.1 10*3/uL — ABNORMAL LOW (ref 1.7–7.7)
Neutrophils Relative %: 67 %
Platelets: 133 10*3/uL — ABNORMAL LOW (ref 150–400)
RBC: 2.32 MIL/uL — ABNORMAL LOW (ref 3.87–5.11)
RDW: 23.4 % — ABNORMAL HIGH (ref 11.5–15.5)
WBC: 1.7 10*3/uL — ABNORMAL LOW (ref 4.0–10.5)
nRBC: 2.4 % — ABNORMAL HIGH (ref 0.0–0.2)

## 2019-07-11 LAB — COMPREHENSIVE METABOLIC PANEL
ALT: 13 U/L (ref 0–44)
AST: 21 U/L (ref 15–41)
Albumin: 3.6 g/dL (ref 3.5–5.0)
Alkaline Phosphatase: 112 U/L (ref 38–126)
Anion gap: 14 (ref 5–15)
BUN: 87 mg/dL — ABNORMAL HIGH (ref 6–20)
CO2: 22 mmol/L (ref 22–32)
Calcium: 9.4 mg/dL (ref 8.9–10.3)
Chloride: 101 mmol/L (ref 98–111)
Creatinine, Ser: 5.86 mg/dL — ABNORMAL HIGH (ref 0.44–1.00)
GFR calc Af Amer: 9 mL/min — ABNORMAL LOW (ref >60)
GFR calc non Af Amer: 8 mL/min — ABNORMAL LOW (ref >60)
Glucose, Bld: 88 mg/dL (ref 70–99)
Potassium: 5 mmol/L (ref 3.5–5.1)
Sodium: 137 mmol/L (ref 135–145)
Total Bilirubin: 1.5 mg/dL — ABNORMAL HIGH (ref 0.3–1.2)
Total Protein: 6 g/dL — ABNORMAL LOW (ref 6.5–8.1)

## 2019-07-11 LAB — PROTIME-INR
INR: 1.1 (ref 0.8–1.2)
Prothrombin Time: 14.1 seconds (ref 11.4–15.2)

## 2019-07-11 LAB — HEPATITIS B CORE ANTIBODY, TOTAL: Hep B Core Total Ab: NONREACTIVE

## 2019-07-11 LAB — HEPATITIS B SURFACE ANTIBODY,QUALITATIVE: Hep B S Ab: REACTIVE — AB

## 2019-07-11 MED ORDER — MIDAZOLAM HCL 2 MG/2ML IJ SOLN
INTRAMUSCULAR | Status: AC | PRN
  Administered 2019-07-11 (×2): 0.5 mg via INTRAVENOUS

## 2019-07-11 MED ORDER — MIDODRINE HCL 5 MG PO TABS
ORAL_TABLET | ORAL | Status: AC
  Filled 2019-07-11: qty 1

## 2019-07-11 MED ORDER — FENTANYL CITRATE (PF) 100 MCG/2ML IJ SOLN
INTRAMUSCULAR | Status: AC | PRN
  Administered 2019-07-11: 25 ug via INTRAVENOUS

## 2019-07-11 MED ORDER — OXYCODONE HCL 5 MG PO TABS
ORAL_TABLET | ORAL | Status: AC
  Filled 2019-07-11: qty 2

## 2019-07-11 MED ORDER — LIDOCAINE HCL (PF) 1 % IJ SOLN
INTRAMUSCULAR | Status: AC | PRN
  Administered 2019-07-11: 5 mL
  Administered 2019-07-11: 10 mL

## 2019-07-11 MED ORDER — FENTANYL CITRATE (PF) 100 MCG/2ML IJ SOLN
INTRAMUSCULAR | Status: AC
  Filled 2019-07-11: qty 2

## 2019-07-11 MED ORDER — COLCHICINE 0.6 MG PO TABS
0.6000 mg | ORAL_TABLET | Freq: Two times a day (BID) | ORAL | Status: DC
  Administered 2019-07-12 (×2): 0.6 mg via ORAL
  Filled 2019-07-11 (×2): qty 1

## 2019-07-11 MED ORDER — CEFAZOLIN SODIUM-DEXTROSE 2-4 GM/100ML-% IV SOLN
INTRAVENOUS | Status: AC
  Filled 2019-07-11: qty 100

## 2019-07-11 MED ORDER — LIDOCAINE HCL 1 % IJ SOLN
INTRAMUSCULAR | Status: AC
  Filled 2019-07-11: qty 20

## 2019-07-11 MED ORDER — ALTEPLASE 2 MG IJ SOLR: 2.0000 mg | Freq: Once | INTRAMUSCULAR | Status: DC | PRN

## 2019-07-11 MED ORDER — HEPARIN SODIUM (PORCINE) 5000 UNIT/ML IJ SOLN
5000.0000 [IU] | Freq: Three times a day (TID) | INTRAMUSCULAR | Status: DC
  Administered 2019-07-12 – 2019-07-16 (×11): 5000 [IU] via SUBCUTANEOUS
  Filled 2019-07-11 (×13): qty 1

## 2019-07-11 MED ORDER — SODIUM CHLORIDE 0.9 % IV SOLN: 100.0000 mL | INTRAVENOUS | Status: DC | PRN

## 2019-07-11 MED ORDER — HEPARIN SODIUM (PORCINE) 1000 UNIT/ML IJ SOLN
INTRAMUSCULAR | Status: AC
  Filled 2019-07-11: qty 1

## 2019-07-11 MED ORDER — HEPARIN SODIUM (PORCINE) 1000 UNIT/ML DIALYSIS
1000.0000 [IU] | INTRAMUSCULAR | Status: DC | PRN
  Filled 2019-07-11: qty 1

## 2019-07-11 MED ORDER — MIDAZOLAM HCL 2 MG/2ML IJ SOLN
INTRAMUSCULAR | Status: AC
  Filled 2019-07-11: qty 2

## 2019-07-11 MED ORDER — HEPARIN SODIUM (PORCINE) 1000 UNIT/ML IJ SOLN
INTRAMUSCULAR | Status: AC
  Filled 2019-07-11: qty 4

## 2019-07-11 MED ORDER — HEPARIN SODIUM (PORCINE) 1000 UNIT/ML IJ SOLN
INTRAMUSCULAR | Status: AC | PRN
  Administered 2019-07-11: 4.2 mL via INTRAVENOUS

## 2019-07-11 MED ORDER — CEFAZOLIN SODIUM-DEXTROSE 1-4 GM/50ML-% IV SOLN
1.0000 g | INTRAVENOUS | Status: AC
  Administered 2019-07-11: 1 g via INTRAVENOUS

## 2019-07-11 NOTE — Consult Note (Signed)
Chief Complaint: Patient was seen in consultation today for tunneled dialysis catheter placement Chief Complaint  Patient presents with  . Weakness  . Abnormal Lab   at the request of Dr Graylon Gunning   Supervising Physician: Aletta Edouard  Patient Status: [facility] - In-pt  History of Present Illness: Tina Watkins is a 54 y.o. female   Acute kidney injury on chronic kidney disease Suffered Covid 19 infection 2 mo ago Progression of renal failure Need to initiate dialysis asap per Nephrology  Post renal transplant 1994; 1999; 2005 Dialysis per catheter on and off x 30 yrs Chronic anemia of renal disease  Scheduled today for tunneled dialysis catheter placement   Past Medical History:  Diagnosis Date  . Bacteremia due to Gram-negative bacteria 05/23/2011  . Blind    right eye  . Blind right eye   . CHF (congestive heart failure) ([place])   . Chronic lower back pain   . Complication of anesthesia    "woke up during OR; I have an extremely high tolerance" (12/11/2011) 1 procedure was graft; the other procedures were procedures that are typically done with sedation.  . DDD (degenerative disc disease), cervical   . Depression   . Dysrhythmia    "tachycardia" (12/11/2011) new onset afib 10/15/14 EKG  . E coli bacteremia 06/18/2011  . Elevated LDL cholesterol level 12/2018  . ESRD (end stage renal disease) ([place]) 06/12/2011  . Fibromyalgia   . Gastroparesis   . Gastroparesis   . GERD (gastroesophageal reflux disease)   . Glaucoma    right eye  . Gout   . H/O carpal tunnel syndrome   . Headache(784.0)    "not often anymore" (12/11/2011)  . Herpes genitalia 1994  . History of blood transfusion    "more than a few times" (12/11/2011)  . History of stomach ulcers   . Hypotension   . Iron deficiency anemia   . New onset a-fib ([place])    10/15/14 EKG  . Osteopenia   . Peripheral neuropathy 11/2018  . Seizures ([place]) 1994   "post transplant; only have had that one"  (12/11/2011)  . Spinal stenosis in cervical region   . Stroke [facility])     left basal ganglia lacunar infarct; Right frontal lobe lacunar infarct.  . Stroke [facility]) ~ 1999; 2001   "briefly lost my vision; lost my right eye" (12/11/2011)  . Vitamin D deficiency 12/2018    Past Surgical History:  Procedure Laterality Date  . ANTERIOR CERVICAL DECOMP/DISCECTOMY FUSION N/A 01/08/2015   Procedure: Anterior Cervical Three-Four/Four-Five Decompression/Diskectomy/Fusion;  Surgeon: Leeroy Cha, MD;  Location: [place] NEURO ORS;  Service: Neurosurgery;  Laterality: N/A;  C3-4 C4-5 Anterior cervical decompression/diskectomy/fusion  . APPENDECTOMY  ~ 2004  . CATARACT EXTRACTION     right eye  . COLONOSCOPY    . ENUCLEATION  2001   "right"  . ESOPHAGOGASTRODUODENOSCOPY (EGD) WITH PROPOFOL N/A 04/21/2012   Procedure: ESOPHAGOGASTRODUODENOSCOPY (EGD) WITH PROPOFOL;  Surgeon: Milus Banister, MD;  Location: [facility] ENDOSCOPY;  Service: Endoscopy;  Laterality: N/A;  . 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 [place]; 1999; 2005   "right"  . MULTIPLE TOOTH EXTRACTIONS    . RIGHT HEART CATH N/A 03/23/2019   Procedure: RIGHT HEART CATH;  Surgeon: Jolaine Artist, MD;  Location: [place] CV LAB;  Service: Cardiovascular;  Laterality: N/A;  . TONSILLECTOMY    .  TOTAL NEPHRECTOMY  1988?; 1994; 2005    Allergies: Levofloxacin and Tape  Medications: Prior to Admission medications   Medication Sig Start Date End Date Taking? Authorizing Provider  aspirin EC 81 MG tablet Take 81 mg by mouth daily.    Yes [provider]  azaTHIOprine (IMURAN) 50 MG tablet Take 100 mg by mouth daily.    Yes [provider]  colchicine 0.6 MG tablet Take 0.6-1.2 mg by mouth See admin instructions. Take 1.2 mg by mouth at onset, then take 0.6 mg three times a day as need for gout flare   Yes [provider]  Darbepoetin Alfa  (ARANESP, ALBUMIN FREE, IJ) Inject as directed every 14 (fourteen) days.   Yes [provider]  famotidine (PEPCID) 20 MG tablet Take 1 tablet (20 mg total) by mouth 2 (two) times daily. Patient taking differently: Take 20 mg by mouth as needed for heartburn.  06/13/19  Yes [name], FNP  fluticasone (FLONASE) 50 MCG/ACT nasal spray Place 1 spray into both nostrils daily as needed for allergies or rhinitis.   Yes [provider]  furosemide (LASIX) 80 MG tablet Take 80 mg by mouth daily. 06/25/19  Yes [provider]  gabapentin (NEURONTIN) 100 MG capsule Take 100 mg by mouth 2 (two) times daily.  09/27/18  Yes [provider]  latanoprost (XALATAN) 0.005 % ophthalmic solution Place 1 drop into the left eye at bedtime. 03/30/17  Yes [provider]  lidocaine (LIDODERM) 5 % Place 1 patch onto the skin daily as needed (for pain- remove old patch first).  10/28/18  Yes [provider]  midodrine (PROAMATINE) 5 MG tablet Take 1 tablet (5 mg total) by mouth 2 (two) times daily with a meal. 05/18/19  Yes Ghimire, Henreitta Leber, MD  nystatin (MYCOSTATIN) 100000 UNIT/ML suspension Use as directed 5 mLs in the mouth or throat daily as needed (FOR THRUSH).    Yes [provider]  ondansetron (ZOFRAN-ODT) 4 MG disintegrating tablet Take 1 tablet (4 mg total) by mouth every 8 (eight) hours as needed for nausea or vomiting. 06/13/19  Yes [name], FNP  oxyCODONE-acetaminophen (PERCOCET) 10-325 MG tablet Take 1 tablet by mouth See admin instructions. Take 1 tablet by mouth every four to six hours as needed for pain   Yes [provider]  predniSONE (DELTASONE) 5 MG tablet Take 5 mg by mouth daily. 06/22/11  Yes [name], MD  PROVENTIL HFA 108 [phone] Base) MCG/ACT inhaler Inhale 2 puffs into the lungs 4 (four) times daily as needed for shortness of breath. 08/04/17  Yes [provider]  tacrolimus (PROGRAF) 1 MG capsule  Take 2-3 mg by mouth See admin instructions. Take 3 mg by mouth in the morning and 2 mg at bedtime 06/22/11  Yes [name], MD  Vitamin D, Ergocalciferol, (DRISDOL) 1.25 MG (50000 UNIT) CAPS capsule Take 50,000 Units by mouth every Sunday.    Yes [provider]  zolpidem (AMBIEN) 10 MG tablet Take 10 mg by mouth at bedtime as needed for sleep.   Yes [provider]  sodium bicarbonate 650 MG tablet Take 1 tablet (650 mg total) by mouth 2 (two) times daily. Patient not taking: Reported on 07/07/2019 05/18/19   Jonetta Osgood, MD     Family History  Problem Relation Age of Onset  . Glaucoma Mother   . Pancreatic cancer Father   . Multiple sclerosis Brother   . Hypertension Maternal Grandmother   .  Breast cancer Neg Hx     Social History   Socioeconomic History  . Marital status: Single    Spouse name: Not on file  . Number of children: 0  . Years of education: some college  . Highest education level: Not on file  Occupational History  . Occupation: disabled    Fish farm manager: UNEMPLOYED  Tobacco Use  . Smoking status: Current Every Day Smoker    Packs/day: 0.50    Years: 35.00    Pack years: 17.50    Types: Cigarettes  . Smokeless tobacco: Never Used  Substance and Sexual Activity  . Alcohol use: Yes    Comment: rare  . Drug use: Yes    Types: Marijuana    Comment: occasional use  . Sexual activity: Not Currently  Other Topics Concern  . Not on file  Social History Narrative   Right-handed.   Four cups caffeine per day.   Lives alone.   Social Determinants of Health   Financial Resource Strain:   . Difficulty of Paying Living Expenses:   Food Insecurity:   . Worried About Charity fundraiser in the Last Year:   . Arboriculturist in the Last Year:   Transportation Needs:   . Film/video editor (Medical):   [name] Lack of Transportation (Non-Medical):   Physical Activity:   . Days of Exercise per Week:   . Minutes of Exercise per Session:    Stress:   . Feeling of Stress :   Social Connections:   . Frequency of Communication with Friends and Family:   . Frequency of Social Gatherings with Friends and Family:   . Attends Religious Services:   . Active Member of Clubs or Organizations:   . Attends Archivist Meetings:   [name] Marital Status:     Review of Systems: A 12 point ROS discussed and pertinent positives are indicated in the HPI above.  All other systems are negative.  Review of Systems  Constitutional: Positive for activity change and fatigue. Negative for fever.  Respiratory: Positive for shortness of breath.   Gastrointestinal: Negative for abdominal pain.  Neurological: Positive for weakness.  Psychiatric/Behavioral: Negative for behavioral problems and confusion.    Vital Signs: BP 95/72 (BP Location: Right Wrist)   Pulse 65   Temp 98.4 F (36.9 C) (Oral)   Resp 16   Ht 4\' 11"  (1.499 m)   Wt 134 lb 14.7 oz (61.2 kg)   SpO2 98%   BMI 27.25 kg/m   Physical Exam Vitals reviewed.  Cardiovascular:     Rate and Rhythm: Normal rate and regular rhythm.     Heart sounds: Normal heart sounds.  Pulmonary:     Breath sounds: Normal breath sounds. No wheezing.  Abdominal:     Palpations: Abdomen is soft.  Musculoskeletal:        General: Normal range of motion.  Skin:    General: Skin is warm and dry.  Neurological:     Mental Status: She is alert and oriented to person, place, and time.  Psychiatric:        Behavior: Behavior normal.     Imaging: DG Chest 1 View  Result Date: 07/07/2019 CLINICAL DATA:  Weakness EXAM: CHEST  1 VIEW COMPARISON:  05/16/2019 FINDINGS: Small left pleural effusion. Heart is mildly enlarged. Bilateral lower lobe airspace opacities. No acute bony abnormality. IMPRESSION: Bilateral lower lobe atelectasis or infiltrates. Small left effusion. Electronically Signed   By: Rolm Baptise  M.D.   On: 07/07/2019 17:13    Labs:  CBC: Recent Labs    07/09/19 0312  07/09/19 1037 07/10/19 0415 07/11/19 0422  WBC 1.4* 1.5* 1.3* 1.7*  HGB 8.1* 8.1* 7.9* 7.7*  HCT 25.2* 25.5* 24.2* 24.0*  PLT 121* 125* 115* 133*    COAGS: Recent Labs    08/25/18 1711 03/22/19 0821  INR 3.9* 1.0    BMP: Recent Labs    07/08/19 0350 07/09/19 0312 07/10/19 0415 07/11/19 0422  NA 137 139 137 137  K 5.0 5.3* 4.7 5.0  CL 106 105 106 101  CO2 20* 19* 19* 22  GLUCOSE 82 80 80 88  BUN 82* 84* 85* 87*  CALCIUM 9.1 9.3 9.1 9.4  CREATININE 5.61* 5.33* 5.56* 5.86*  GFRNONAA 8* 9* 8* 8*  GFRAA 9* 10* 9* 9*    LIVER FUNCTION TESTS: Recent Labs    06/07/19 1242 06/07/19 1242 07/07/19 1033 07/09/19 0312 07/10/19 0415 07/11/19 0422  BILITOT 1.4*  --  1.4*  --  1.5* 1.5*  AST 27  --  30  --  24 21  ALT 18  --  14  --  11 13  ALKPHOS 140*  --  125  --  116 112  PROT 6.1*  --  5.9*  --  5.7* 6.0*  ALBUMIN 3.4*   < > 3.5 3.4* 3.5 3.6   < > = values in this interval not displayed.    TUMOR MARKERS: No results for input(s): AFPTM, CEA, CA199, CHROMGRNA in the last 8760 hours.  Assessment and Plan:  CKD-- acute injury(Covid 19 -- 2 mo ago) Progression of renal failure Now to initiate dialysis asap per Nephrology Scheduled for tunneled dialysis catheter placement in IR today Risks and benefits discussed with the patient including, but not limited to bleeding, infection, vascular injury, pneumothorax which may require chest tube placement, air embolism or even death  All of the patient's questions were answered, patient is agreeable to proceed. Consent signed and in chart.   Thank you for this interesting consult.  I greatly enjoyed meeting Sallie Staron and look forward to participating in their care.  A copy of this report was sent to the requesting provider on this date.  Electronically Signed: Lavonia Drafts, PA-C 07/11/2019, 9:31 AM    I spent a total of 20 Minutes    in face to face in clinical consultation, greater than 50% of  which was counseling/coordinating care for tunneled dialysis catheter placement

## 2019-07-11 NOTE — Progress Notes (Signed)
Renal Navigator met with patient at bedside to discuss referral for OP HD treatment for ESRD as previously discussed with Dr. Posey Pronto. Per notes, patient follows at [facility] with Dr. Marval Regal. She was pleasant and welcoming of Navigator's visit. Patient reports that she did HD on "[facility] in 2005 prior to transplants. She understands that her home is closest to the [place] clinic now and will be referred to this clinic, if they have a seat to accommodate her at this time. She is appreciative. She states no preference in schedule and that she drives herself. She also has Medicaid and states an interest in possibly accessing transportation services. Navigator instructed her in how to do this.  Navigator has submitted referral to [facility] Admissions and will follow closely.  Alphonzo Cruise, [place] Renal Navigator [phone]

## 2019-07-11 NOTE — Procedures (Signed)
Interventional Radiology Procedure Note  Additional Vascular History: The patient has an extensive prior vascular access history including prior SVC stenting, occlusion of the SVC stent documented in 2001, tunneled femoral HD catheter placements in 9030-0923 complicated by IVC stenoses and translumbar IVC HD catheter placement in 3007 complicated by IVC stenosis prior to renal transplantation.  Procedure: 1) US guided IV access of RUE; 2) Access of right IJ vein with guidewire access; 3) Tunneled HD catheter placement via right common femoral vein  Complications: None  Estimated Blood Loss: 20 mL  Findings: 1) Right basilic vein IV access performed under US guidance in upper arm for conscious sedation and IV antibiotic administration. 2) Right IJ patent by [name]. After access, 5 Fr catheter and guidewire would not advance beyond brachiocephalic venous confluence due to chronic SVC thrombus and occlusion with visible calcified SVC thrombus above indwelling SVC stent. 3) Patent right CFV. Guidewire would not advance beyond mid IVC likely due to chronic stenosis. 4) Tunneled Palindrome HD cath measuring 28 cm from tip to cuff placed via right CFV access to level of lower IVC.  Venetia Night. Kathlene Cote, M.D Pager:  [phone]

## 2019-07-11 NOTE — Progress Notes (Signed)
Hemodialysis- Patient tolerated well without issue. 1L uf as ordered. Post vitals stable. Catheter functioning well. Reported off to primary RN

## 2019-07-11 NOTE — Plan of Care (Signed)
  Problem: Pain Managment: Goal: General experience of comfort will improve Outcome: Progressing   Problem: Safety: Goal: Ability to remain free from injury will improve Outcome: Progressing   Problem: Skin Integrity: Goal: Risk for impaired skin integrity will decrease Outcome: Progressing   

## 2019-07-11 NOTE — Progress Notes (Signed)
[facility] PROGRESS NOTE    Progress Note  Tina Watkins  BMW:[id number] DOB: 17-Dec-1965 DOA: 07/07/2019 PCP: [name], FNP     Brief Narrative:   Tina Watkins is an 54 y.o. female end-stage renal disease status post renal transplant now on chronic kidney disease stage IV on chronic steroids and immunosuppressive therapy, chronic anemia in the setting of chronic renal disease and immunosuppression from chemotherapy with intermittent blood transfusions and regular EPO injections by hematology, TIA heart failure moderate pulmonary hypertension presents with increased fatigue and low hemoglobin.  She was also noted to be in acute kidney injury  Assessment/Plan:   Nonoliguric acute kidney injury on chronic kidney disease stage IV: She presented fluid overloaded and with uremic symptoms. Nephrology was consulted and she has been challenged with IV diuresis with poor response to diuretic therapy and subtle uremic symptoms. Further management per renal. She is scheduled for tunneled dialysis catheter on 07/06/2019, nephrology planning HD. We also consulted vascular surgery for access.  Symptomatic anemia in the setting of chronic renal disease Status post 1 unit of packed red blood cells continue Aranesp and IV iron.  Status post renal transplant Steroids tacrolimus, Imuran on hold due to cytopenias. Further management per nephrology.  Chronic hypertension Asymptomatic blood pressure is on the low side.  Pancytopenia/neutropenia Multifactorial in the setting of Imuran hematology was consulted and recommended B12 copper level pending and HIV negative, hepatitis C and B-  Hypertension/chronic diastolic heart failure: Significantly fluid overloaded on physical exam, continue strict I's and O's Daily weights Negative about 2.2 L C nonoliguric acute kidney injury for further details.   DVT prophylaxis: lovenxo Family Communication:none Status is:  Inpatient  Remains inpatient appropriate because:Hemodynamically unstable   Dispo: The patient is from: Home              Anticipated d/c is to: Home              Anticipated d/c date is: 2 days              Patient currently is not medically stable to d/c.        Code Status:     Code Status Orders  (From admission, onward)         Start     Ordered   07/07/19 1615  Full code  Continuous     07/07/19 1616        Code Status History    Date Active Date Inactive Code Status Order ID Comments User Context   05/09/2019 0706 05/18/2019 2040 Full Code [id number]  Shela Leff, MD ED   03/20/2019 1507 03/24/2019 2243 Full Code [id number]  Lequita Halt, MD Inpatient   11/20/2017 0236 11/20/2017 1916 Full Code [id number]  Ivor Costa, MD ED   08/09/2017 2312 08/17/2017 1950 Full Code [id number]  Kathrene Alu, MD Inpatient   09/29/2015 2318 10/03/2015 1841 Full Code [id number]  Reubin Milan, MD Inpatient   09/29/2015 2318 09/29/2015 2318 Full Code [id number]  Reubin Milan, MD Inpatient   01/08/2015 1637 01/10/2015 1918 Full Code [id number]  Leeroy Cha, MD Inpatient   06/13/2014 1008 06/16/2014 1800 Full Code [id number]  Orson Eva, MD Inpatient   04/10/2012 0109 04/11/2012 2008 Full Code [id number]  Derrill Kay, MD Inpatient   12/10/2011 2204 12/12/2011 1423 Full Code [id number]  Neomia Glass, RN Inpatient   05/22/2011 1520 05/30/2011 1553 Full Code [id number]  Minor, Grace Bushy, NP ED   Advance Care Planning  Activity        IV Access:    Peripheral IV   Procedures and diagnostic studies:   No results found.   Medical Consultants:    None.  Anti-Infectives:   none  Subjective:    [name] no new complaints.  Objective:    Vitals:   07/10/19 2024 07/11/19 0300 07/11/19 0600 07/11/19 0821  BP: (!) 102/53 (!) 144/95  95/72  Pulse: 68 66  65  Resp: 17 18  16   Temp: 98.7 F (37.1 C) 98.2 F (36.8 C)  98.4 F (36.9 C)  TempSrc:  Oral Oral  Oral  SpO2: 91% 92%  98%  Weight:   61.2 kg   Height:       SpO2: 98 %   Intake/Output Summary (Last 24 hours) at 07/11/2019 1011 Last data filed at 07/11/2019 0300 Gross per 24 hour  Intake 240 ml  Output 600 ml  Net -360 ml   Filed Weights   07/07/19 1953 07/10/19 0128 07/11/19 0600  Weight: 63 kg 61.6 kg 61.2 kg    Exam: General exam: In no acute distress. Respiratory system: Good air movement and clear to auscultation. Cardiovascular system: S1 & S2 heard, RRR. No JVD, murmurs, rubs, gallops or clicks.  Gastrointestinal system: Abdomen is nondistended, soft and nontender.  Central nervous system: Alert and oriented. No focal neurological deficits. Extremities: No pedal edema. Skin: No rashes, lesions or ulcers Psychiatry: Judgement and insight appear normal. Mood & affect appropriate.    Data Reviewed:    Labs: Basic Metabolic Panel: Recent Labs  Lab 07/07/19 1033 07/07/19 1033 07/08/19 0350 07/08/19 0350 07/09/19 0312 07/09/19 0312 07/10/19 0415 07/11/19 0422  NA 137  --  137  --  139  --  137 137  K 4.6   < > 5.0   < > 5.3*   < > 4.7 5.0  CL 105  --  106  --  105  --  106 101  CO2 20*  --  20*  --  19*  --  19* 22  GLUCOSE 84  --  82  --  80  --  80 88  BUN 80*  --  82*  --  84*  --  85* 87*  CREATININE 5.82*  --  5.61*  --  5.33*  --  5.56* 5.86*  CALCIUM 9.1  --  9.1  --  9.3  --  9.1 9.4  PHOS  --   --   --   --  6.0*  --  5.9*  --    < > = values in this interval not displayed.   GFR Estimated Creatinine Clearance: 9 mL/min (A) (by C-G formula based on SCr of 5.86 mg/dL (H)). Liver Function Tests: Recent Labs  Lab 07/07/19 1033 07/09/19 0312 07/10/19 0415 07/11/19 0422  AST 30  --  24 21  ALT 14  --  11 13  ALKPHOS 125  --  116 112  BILITOT 1.4*  --  1.5* 1.5*  PROT 5.9*  --  5.7* 6.0*  ALBUMIN 3.5 3.4* 3.5 3.6   Recent Labs  Lab 07/07/19 1033  LIPASE 44   No results for input(s): AMMONIA in the last 168  hours. Coagulation profile No results for input(s): INR, PROTIME in the last 168 hours. COVID-19 Labs  Recent Labs    07/08/19 1446  CRP 1.2*    Lab Results  Component Value Date   SARSCOV2NAA POSITIVE (A) 05/09/2019  [place] NEGATIVE 03/17/2019    CBC: Recent Labs  Lab 07/07/19 1033 07/07/19 1033 07/08/19 0350 07/08/19 1446 07/09/19 0312 07/09/19 1037 07/10/19 0415 07/11/19 0422  WBC 1.9*   < > 1.5*  --  1.4* 1.5* 1.3* 1.7*  NEUTROABS 1.3*  --   --   --   --  1.0* 1.0* 1.1*  HGB 7.5*   < > 8.2*  --  8.1* 8.1* 7.9* 7.7*  HCT 24.0*   < > 25.7* 25.7* 25.2* 25.5* 24.2* 24.0*  MCV 110.1*   < > 103.2*  --  102.4* 103.7* 102.1* 103.4*  PLT 146*   < > 135*  --  121* 125* 115* 133*   < > = values in this interval not displayed.   Cardiac Enzymes: No results for input(s): CKTOTAL, CKMB, CKMBINDEX, TROPONINI in the last 168 hours. BNP (last 3 results) No results for input(s): PROBNP in the last 8760 hours. CBG: No results for input(s): GLUCAP in the last 168 hours. D-Dimer: No results for input(s): DDIMER in the last 72 hours. Hgb A1c: No results for input(s): HGBA1C in the last 72 hours. Lipid Profile: No results for input(s): CHOL, HDL, LDLCALC, TRIG, CHOLHDL, LDLDIRECT in the last 72 hours. Thyroid function studies: No results for input(s): TSH, T4TOTAL, T3FREE, THYROIDAB in the last 72 hours.  Invalid input(s): FREET3 Anemia work up: Recent Labs    07/08/19 [place]   Sepsis Labs: Recent Labs  Lab 07/09/19 0312 07/09/19 1037 07/10/19 0415 07/11/19 0422  WBC 1.4* 1.5* 1.3* 1.7*   Microbiology No results found for this or any previous visit (from the past 240 hour(s)).   Medications:   . aspirin EC  81 mg Oral Daily  . darbepoetin (ARANESP) injection - NON-DIALYSIS  100 mcg Subcutaneous Q Mon-1800  . famotidine  20 mg Oral Daily  . furosemide  80 mg Intravenous Q8H  . gabapentin  100 mg Oral BID  . [START ON 07/12/2019] heparin   5,000 Units Subcutaneous Q8H  . latanoprost  1 drop Left Eye QHS  . metolazone  5 mg Oral BID  . midodrine  5 mg Oral BID WC  . pneumococcal 23 valent vaccine  0.5 mL Intramuscular Tomorrow-1000  . predniSONE  5 mg Oral Daily  . sevelamer carbonate  800 mg Oral TID WC  . tacrolimus  2 mg Oral QHS  . tacrolimus  3 mg Oral q morning - 10a  . Vitamin D (Ergocalciferol)  50,000 Units Oral Q Sun   Continuous Infusions: .  ceFAZolin (ANCEF) IV        LOS: 4 days   Charlynne Cousins  [facility]  07/11/2019, 10:11 AM

## 2019-07-11 NOTE — Progress Notes (Signed)
Patient ID: Tina Watkins, female   DOB: April 05, 1965, 54 y.o.   MRN: [id number] [facility] Progress Note   Assessment/ Plan:   1. Acute kidney Injury on chronic kidney disease stage IV T: With likely progression following significant acute kidney injury when she suffered COVID-19 infection 2 months ago. With poor response to diuretics overnight and with subtle uremic symptoms and evidence of volume excess on exam for which she agrees to start dialysis today--I suspect this will be long term. I have consulted IR for [facility] and will get vein mapping followed by [facility] consult for access planning. Will start process for OP HD unit placement.  2.  Status post kidney transplant: Usually on immunosuppressive therapy with Prograf, Imuran and prednisone with Imuran currently on hold due to neutropenia. 3.  Anemia of chronic kidney disease: With adequate iron stores and status post PRBC transfusion however, downtrending H&H.  Started on darbepoetin. 4.  Hypotension: Asymptomatic on midodrine. Monitor with HD/UF.  5.  Secondary hyperparathyroidism: Calcium level acceptable with elevated phosphorus noted, monitor with low-dose sevelamer.  Subjective:   She remains very emotional with the course of events and what appears to be gradual decline and failure of renal allograft- poor response to diuretics overnight and she is agreeable to HD today.    Objective:   BP 95/72 (BP Location: Right Wrist)   Pulse 65   Temp 98.4 F (36.9 C) (Oral)   Resp 16   Ht 4\' 11"  (1.499 m)   Wt 61.2 kg   SpO2 98%   BMI 27.25 kg/m   Intake/Output Summary (Last 24 hours) at 07/11/2019 0830 Last data filed at 07/11/2019 0300 Gross per 24 hour  Intake 240 ml  Output 600 ml  Net -360 ml   Weight change: -0.4 kg  Physical Exam: Gen: Comfortably sitting on the edge of her bed, tearful CVS: Pulse regular rhythm, normal rate, S1 and S2 normal Resp: Coarse breath sounds bilaterally with fine rales Abd:  Soft, obese, nontender Ext: Trace lower extremity edema  Imaging: No results found.  Labs: BMET Recent Labs  Lab 07/07/19 1033 07/08/19 0350 07/09/19 0312 07/10/19 0415 07/11/19 0422  NA 137 137 139 137 137  K 4.6 5.0 5.3* 4.7 5.0  CL 105 106 105 106 101  CO2 20* 20* 19* 19* 22  GLUCOSE 84 82 80 80 88  BUN 80* 82* 84* 85* 87*  CREATININE 5.82* 5.61* 5.33* 5.56* 5.86*  CALCIUM 9.1 9.1 9.3 9.1 9.4  PHOS  --   --  6.0* 5.9*  --    CBC Recent Labs  Lab 07/07/19 1033 07/08/19 0350 07/09/19 0312 07/09/19 1037 07/10/19 0415 07/11/19 0422  WBC 1.9*   < > 1.4* 1.5* 1.3* 1.7*  NEUTROABS 1.3*  --   --  1.0* 1.0* 1.1*  HGB 7.5*   < > 8.1* 8.1* 7.9* 7.7*  HCT 24.0*   < > 25.2* 25.5* 24.2* 24.0*  MCV 110.1*   < > 102.4* 103.7* 102.1* 103.4*  PLT 146*   < > 121* 125* 115* 133*   < > = values in this interval not displayed.    Medications:    . aspirin EC  81 mg Oral Daily  . darbepoetin (ARANESP) injection - NON-DIALYSIS  100 mcg Subcutaneous Q Mon-1800  . famotidine  20 mg Oral Daily  . furosemide  80 mg Intravenous Q8H  . gabapentin  100 mg Oral BID  . heparin  5,000 Units Subcutaneous Q8H  . latanoprost  1 drop Left Eye QHS  . metolazone  5 mg Oral BID  . midodrine  5 mg Oral BID WC  . pneumococcal 23 valent vaccine  0.5 mL Intramuscular Tomorrow-1000  . predniSONE  5 mg Oral Daily  . sevelamer carbonate  800 mg Oral TID WC  . tacrolimus  2 mg Oral QHS  . tacrolimus  3 mg Oral q morning - 10a  . Vitamin D (Ergocalciferol)  50,000 Units Oral Q Glennie Hawk, MD 07/11/2019, 8:30 AM

## 2019-07-11 NOTE — Progress Notes (Addendum)
Held 2200 Lasix d/t low BP. Notified DR. Sharlet Salina, stated hold if SBP<100.   0535 BP 88/49 MAP 61, notified Dr. Sharlet Salina, will continue to monitor pt until otherwise advised.

## 2019-07-12 ENCOUNTER — Inpatient Hospital Stay (HOSPITAL_COMMUNITY): Payer: Medicare Other

## 2019-07-12 DIAGNOSIS — N185 Chronic kidney disease, stage 5: Secondary | ICD-10-CM

## 2019-07-12 LAB — COMPREHENSIVE METABOLIC PANEL
ALT: 12 U/L (ref 0–44)
AST: 24 U/L (ref 15–41)
Albumin: 3.6 g/dL (ref 3.5–5.0)
Alkaline Phosphatase: 111 U/L (ref 38–126)
Anion gap: 13 (ref 5–15)
BUN: 50 mg/dL — ABNORMAL HIGH (ref 6–20)
CO2: 22 mmol/L (ref 22–32)
Calcium: 8.9 mg/dL (ref 8.9–10.3)
Chloride: 104 mmol/L (ref 98–111)
Creatinine, Ser: 4.4 mg/dL — ABNORMAL HIGH (ref 0.44–1.00)
GFR calc Af Amer: 13 mL/min — ABNORMAL LOW (ref >60)
GFR calc non Af Amer: 11 mL/min — ABNORMAL LOW (ref >60)
Glucose, Bld: 87 mg/dL (ref 70–99)
Potassium: 4.4 mmol/L (ref 3.5–5.1)
Sodium: 139 mmol/L (ref 135–145)
Total Bilirubin: 1.8 mg/dL — ABNORMAL HIGH (ref 0.3–1.2)
Total Protein: 6.1 g/dL — ABNORMAL LOW (ref 6.5–8.1)

## 2019-07-12 LAB — CBC WITH DIFFERENTIAL/PLATELET
Abs Immature Granulocytes: 0.02 10*3/uL (ref 0.00–0.07)
Basophils Absolute: 0 10*3/uL (ref 0.0–0.1)
Basophils Relative: 0 %
Eosinophils Absolute: 0 10*3/uL (ref 0.0–0.5)
Eosinophils Relative: 2 %
HCT: 25.6 % — ABNORMAL LOW (ref 36.0–46.0)
Hemoglobin: 8.3 g/dL — ABNORMAL LOW (ref 12.0–15.0)
Immature Granulocytes: 1 %
Lymphocytes Relative: 36 %
Lymphs Abs: 0.7 10*3/uL (ref 0.7–4.0)
MCH: 33.5 pg (ref 26.0–34.0)
MCHC: 32.4 g/dL (ref 30.0–36.0)
MCV: 103.2 fL — ABNORMAL HIGH (ref 80.0–100.0)
Monocytes Absolute: 0.1 10*3/uL (ref 0.1–1.0)
Monocytes Relative: 6 %
Neutro Abs: 1 10*3/uL — ABNORMAL LOW (ref 1.7–7.7)
Neutrophils Relative %: 55 %
Platelets: 134 10*3/uL — ABNORMAL LOW (ref 150–400)
RBC: 2.48 MIL/uL — ABNORMAL LOW (ref 3.87–5.11)
RDW: 23.7 % — ABNORMAL HIGH (ref 11.5–15.5)
WBC: 1.8 10*3/uL — ABNORMAL LOW (ref 4.0–10.5)
nRBC: 3.3 % — ABNORMAL HIGH (ref 0.0–0.2)

## 2019-07-12 LAB — COPPER, SERUM: Copper: 130 ug/dL (ref 80–158)

## 2019-07-12 MED ORDER — FENTANYL CITRATE (PF) 100 MCG/2ML IJ SOLN
25.0000 ug | Freq: Once | INTRAMUSCULAR | Status: AC
  Administered 2019-07-12: 25 ug via INTRAVENOUS
  Filled 2019-07-12: qty 2

## 2019-07-12 MED ORDER — COLCHICINE 0.6 MG PO TABS
0.6000 mg | ORAL_TABLET | ORAL | Status: DC
  Administered 2019-07-14 – 2019-07-15 (×2): 0.6 mg via ORAL
  Filled 2019-07-12 (×2): qty 1

## 2019-07-12 MED ORDER — CHLORHEXIDINE GLUCONATE CLOTH 2 % EX PADS
6.0000 | MEDICATED_PAD | Freq: Every day | CUTANEOUS | Status: DC
  Administered 2019-07-13 – 2019-07-17 (×5): 6 via TOPICAL

## 2019-07-12 MED ORDER — MIDODRINE HCL 5 MG PO TABS
10.0000 mg | ORAL_TABLET | Freq: Two times a day (BID) | ORAL | Status: DC
  Administered 2019-07-12: 10 mg via ORAL
  Filled 2019-07-12: qty 2

## 2019-07-12 MED ORDER — HEPARIN SODIUM (PORCINE) 1000 UNIT/ML IJ SOLN
INTRAMUSCULAR | Status: AC
  Administered 2019-07-12: 1000 [IU]
  Filled 2019-07-12: qty 4

## 2019-07-12 MED ORDER — PREDNISONE 20 MG PO TABS
40.0000 mg | ORAL_TABLET | Freq: Every day | ORAL | Status: DC
  Administered 2019-07-12 – 2019-07-13 (×2): 40 mg via ORAL
  Filled 2019-07-12 (×2): qty 2

## 2019-07-12 MED ORDER — MIDODRINE HCL 5 MG PO TABS
10.0000 mg | ORAL_TABLET | Freq: Three times a day (TID) | ORAL | Status: DC
  Administered 2019-07-12 (×2): 10 mg via ORAL
  Filled 2019-07-12 (×2): qty 2

## 2019-07-12 NOTE — Progress Notes (Addendum)
Hospital Consult    Reason for Consult:  Dialysis access Requesting Physician:  Dr. Posey Pronto MRN #:  [id number]  History of Present Illness: This is a 54 y.o. female with extensive past medical history including history of kidney transplantation 2005.  Per nephrology, she is likely now end-stage renal disease following acute kidney injury after COVID-19 infection 2 months ago.  She has a known SVC occlusion.  Interventional radiology was only able to place right common femoral vein short TDC yesterday due to severe stenosis and possible occlusion of IVC as well.  She is being seen in consultation for evaluation for permanent access placement.  She also states AV grafts have never worked for her in the past.  She continues immunosuppressive therapy.  Past Medical History:  Diagnosis Date  . Bacteremia due to Gram-negative bacteria 05/23/2011  . Blind    right eye  . Blind right eye   . CHF (congestive heart failure) ([place])   . Chronic lower back pain   . Complication of anesthesia    "woke up during OR; I have an extremely high tolerance" (12/11/2011) 1 procedure was graft; the other procedures were procedures that are typically done with sedation.  . DDD (degenerative disc disease), cervical   . Depression   . Dysrhythmia    "tachycardia" (12/11/2011) new onset afib 10/15/14 EKG  . E coli bacteremia 06/18/2011  . Elevated LDL cholesterol level 12/2018  . ESRD (end stage renal disease) ([place]) 06/12/2011  . Fibromyalgia   . Gastroparesis   . Gastroparesis   . GERD (gastroesophageal reflux disease)   . Glaucoma    right eye  . Gout   . H/O carpal tunnel syndrome   . Headache(784.0)    "not often anymore" (12/11/2011)  . Herpes genitalia 1994  . History of blood transfusion    "more than a few times" (12/11/2011)  . History of stomach ulcers   . Hypotension   . Iron deficiency anemia   . New onset a-fib ([place])    10/15/14 EKG  . Osteopenia   . Peripheral neuropathy 11/2018  . Seizures  ([place]) 1994   "post transplant; only have had that one" (12/11/2011)  . Spinal stenosis in cervical region   . Stroke [facility])     left basal ganglia lacunar infarct; Right frontal lobe lacunar infarct.  . Stroke [facility]) ~ 1999; 2001   "briefly lost my vision; lost my right eye" (12/11/2011)  . Vitamin D deficiency 12/2018    Past Surgical History:  Procedure Laterality Date  . ANTERIOR CERVICAL DECOMP/DISCECTOMY FUSION N/A 01/08/2015   Procedure: Anterior Cervical Three-Four/Four-Five Decompression/Diskectomy/Fusion;  Surgeon: Leeroy Cha, MD;  Location: [facility] NEURO ORS;  Service: Neurosurgery;  Laterality: N/A;  C3-4 C4-5 Anterior cervical decompression/diskectomy/fusion  . APPENDECTOMY  ~ 2004  . CATARACT EXTRACTION     right eye  . COLONOSCOPY    . ENUCLEATION  2001   "right"  . ESOPHAGOGASTRODUODENOSCOPY (EGD) WITH PROPOFOL N/A 04/21/2012   Procedure: ESOPHAGOGASTRODUODENOSCOPY (EGD) WITH PROPOFOL;  Surgeon: Milus Banister, MD;  Location: [facility] ENDOSCOPY;  Service: Endoscopy;  Laterality: N/A;  . 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 IR FLUORO GUIDE CV LINE RIGHT  07/11/2019  . IR RADIOLOGY PERIPHERAL GUIDED IV START  07/11/2019  . IR US GUIDE VASC ACCESS RIGHT  07/11/2019  . IR US GUIDE VASC ACCESS RIGHT  07/11/2019  . IR US GUIDE VASC ACCESS  RIGHT  07/11/2019  . [place]; 1999; 2005   "right"  . MULTIPLE TOOTH EXTRACTIONS    . RIGHT HEART CATH N/A 03/23/2019   Procedure: RIGHT HEART CATH;  Surgeon: Jolaine Artist, MD;  Location: [facility] CV LAB;  Service: Cardiovascular;  Laterality: N/A;  . TONSILLECTOMY    . TOTAL NEPHRECTOMY  1988?; 1994; 2005    Allergies  Allergen Reactions  . Levofloxacin Itching and Rash  . Tape Other (See Comments)    "Certain surgical tapes peel off my skin"    Prior to Admission medications   Medication Sig Start Date End Date Taking? Authorizing Provider  aspirin EC 81 MG  tablet Take 81 mg by mouth daily.    Yes [provider]  azaTHIOprine (IMURAN) 50 MG tablet Take 100 mg by mouth daily.    Yes [provider]  colchicine 0.6 MG tablet Take 0.6-1.2 mg by mouth See admin instructions. Take 1.2 mg by mouth at onset, then take 0.6 mg three times a day as need for gout flare   Yes [provider]  Darbepoetin Alfa (ARANESP, ALBUMIN FREE, IJ) Inject as directed every 14 (fourteen) days.   Yes [provider]  famotidine (PEPCID) 20 MG tablet Take 1 tablet (20 mg total) by mouth 2 (two) times daily. Patient taking differently: Take 20 mg by mouth as needed for heartburn.  06/13/19  Yes [name], FNP  fluticasone (FLONASE) 50 MCG/ACT nasal spray Place 1 spray into both nostrils daily as needed for allergies or rhinitis.   Yes [provider]  furosemide (LASIX) 80 MG tablet Take 80 mg by mouth daily. 06/25/19  Yes [provider]  gabapentin (NEURONTIN) 100 MG capsule Take 100 mg by mouth 2 (two) times daily.  09/27/18  Yes [provider]  latanoprost (XALATAN) 0.005 % ophthalmic solution Place 1 drop into the left eye at bedtime. 03/30/17  Yes [provider]  lidocaine (LIDODERM) 5 % Place 1 patch onto the skin daily as needed (for pain- remove old patch first).  10/28/18  Yes [provider]  midodrine (PROAMATINE) 5 MG tablet Take 1 tablet (5 mg total) by mouth 2 (two) times daily with a meal. 05/18/19  Yes Ghimire, Henreitta Leber, MD  nystatin (MYCOSTATIN) 100000 UNIT/ML suspension Use as directed 5 mLs in the mouth or throat daily as needed (FOR THRUSH).    Yes [provider]  ondansetron (ZOFRAN-ODT) 4 MG disintegrating tablet Take 1 tablet (4 mg total) by mouth every 8 (eight) hours as needed for nausea or vomiting. 06/13/19  Yes [name], FNP  oxyCODONE-acetaminophen (PERCOCET) 10-325 MG tablet Take 1 tablet by mouth See admin instructions. Take 1 tablet by mouth every  four to six hours as needed for pain   Yes [provider]  predniSONE (DELTASONE) 5 MG tablet Take 5 mg by mouth daily. 06/22/11  Yes [name], MD  PROVENTIL HFA 108 [phone] Base) MCG/ACT inhaler Inhale 2 puffs into the lungs 4 (four) times daily as needed for shortness of breath. 08/04/17  Yes [provider]  tacrolimus (PROGRAF) 1 MG capsule Take 2-3 mg by mouth See admin instructions. Take 3 mg by mouth in the morning and 2 mg at bedtime 06/22/11  Yes [name], MD  Vitamin D, Ergocalciferol, (DRISDOL) 1.25 MG (50000 UNIT) CAPS capsule Take 50,000 Units by mouth every Sunday.    Yes [provider]  zolpidem (AMBIEN) 10 MG tablet  Take 10 mg by mouth at bedtime as needed for sleep.   Yes [provider]  sodium bicarbonate 650 MG tablet Take 1 tablet (650 mg total) by mouth 2 (two) times daily. Patient not taking: Reported on 07/07/2019 05/18/19   Jonetta Osgood, MD    Social History   Socioeconomic History  . Marital status: Single    Spouse name: Not on file  . Number of children: 0  . Years of education: some college  . Highest education level: Not on file  Occupational History  . Occupation: disabled    Fish farm manager: UNEMPLOYED  Tobacco Use  . Smoking status: Current Every Day Smoker    Packs/day: 0.50    Years: 35.00    Pack years: 17.50    Types: Cigarettes  . Smokeless tobacco: Never Used  Substance and Sexual Activity  . Alcohol use: Yes    Comment: rare  . Drug use: Yes    Types: Marijuana    Comment: occasional use  . Sexual activity: Not Currently  Other Topics Concern  . Not on file  Social History Narrative   Right-handed.   Four cups caffeine per day.   Lives alone.   Social Determinants of Health   Financial Resource Strain:   . Difficulty of Paying Living Expenses:   Food Insecurity:   . Worried About Charity fundraiser in the Last Year:   . Arboriculturist in the Last Year:   Transportation Needs:     . Film/video editor (Medical):   [name] Lack of Transportation (Non-Medical):   Physical Activity:   . Days of Exercise per Week:   . Minutes of Exercise per Session:   Stress:   . Feeling of Stress :   Social Connections:   . Frequency of Communication with Friends and Family:   . Frequency of Social Gatherings with Friends and Family:   . Attends Religious Services:   . Active Member of Clubs or Organizations:   . Attends Archivist Meetings:   [name] Marital Status:   Intimate Partner Violence:   . Fear of Current or Ex-Partner:   . Emotionally Abused:   [name] Physically Abused:   . Sexually Abused:      Family History  Problem Relation Age of Onset  . Glaucoma Mother   . Pancreatic cancer Father   . Multiple sclerosis Brother   . Hypertension Maternal Grandmother   . Breast cancer Neg Hx     ROS: Otherwise negative unless mentioned in HPI  Physical Examination  Vitals:   07/12/19 0836 07/12/19 1030  BP: (!) 78/54 (!) 94/48  Pulse: 78 77  Resp: 16 15  Temp: 99.2 F (37.3 C) 99.6 F (37.6 C)  SpO2: 91% 90%   Body mass index is 26.81 kg/m.  General:  WDWN in NAD Gait: Not observed HENT: WNL, normocephalic Pulmonary: normal non-labored breathing Cardiac: Regular  Abdomen:  soft, NT/ND, no masses Skin: without rashes Vascular Exam/Pulses: Symmetrical 1+ radial pulses; feet are symmetrically warm to touch Extremities: without ischemic changes, without Gangrene , without cellulitis; without open wounds; pain to palpation of right midfoot Musculoskeletal: no muscle wasting or atrophy  Neurologic: A&O X 3;  No focal weakness or paresthesias are detected; speech is fluent/normal Psychiatric:  The pt has Normal affect. Lymph:  Unremarkable  CBC    Component Value Date/Time   WBC 1.8 (L) 07/12/2019 0326   RBC 2.48 (L) 07/12/2019 0326   HGB 8.3 (L)  07/12/2019 0326   HGB 6.7 (LL) 05/08/2019 0854   HCT 25.6 (L) 07/12/2019 0326   HCT 25.7 (L) 07/08/2019  1446   PLT 134 (L) 07/12/2019 0326   PLT 170 05/08/2019 0854   MCV 103.2 (H) 07/12/2019 0326   MCV 98 (H) 05/08/2019 0854   MCH 33.5 07/12/2019 0326   MCHC 32.4 07/12/2019 0326   RDW 23.7 (H) 07/12/2019 0326   RDW 21.9 (H) 05/08/2019 0854   LYMPHSABS 0.7 07/12/2019 0326   MONOABS 0.1 07/12/2019 0326   EOSABS 0.0 07/12/2019 0326   BASOSABS 0.0 07/12/2019 0326    BMET    Component Value Date/Time   NA 139 07/12/2019 0326   NA 138 05/08/2019 0854   K 4.4 07/12/2019 0326   CL 104 07/12/2019 0326   CO2 22 07/12/2019 0326   GLUCOSE 87 07/12/2019 0326   BUN 50 (H) 07/12/2019 0326   BUN 82 (HH) 05/08/2019 0854   CREATININE 4.40 (H) 07/12/2019 0326   CALCIUM 8.9 07/12/2019 0326   CALCIUM 9.0 04/10/2019 0828   GFRNONAA 11 (L) 07/12/2019 0326   GFRAA 13 (L) 07/12/2019 0326    COAGS: Lab Results  Component Value Date   INR 1.1 07/11/2019   INR 1.0 03/22/2019   INR 3.9 (H) 08/25/2018     ASSESSMENT/PLAN: This is a 54 y.o. female with history of kidney transplantation now end-stage renal disease requiring hemodialysis  -Patient is likely not a candidate for upper extremity dialysis access placement including HeRO graft due to SVC occlusion -Interventional radiology was only able to place a short TDC in right common femoral vein due to severe stenosis and possible occlusion of mid IVC -Without outflow, there will also be a high failure risk for a thigh AV graft -Patient may be considered catheter dependent given the above -This case will be discussed with on-call vascular surgeon Dr. Carlis Abbott will evaluate the patient later today and provide further treatment plans   Dagoberto Ligas PA-C [facility] [phone]  I have seen and evaluated the patient. I agree with the PA note as documented above.  54 year old female presents with acute kidney injury on chronic disease stage IV now progressing to end-stage renal disease in the setting of previous kidney  transplant while on immunosuppression.  Vascular surgery was asked to evaluate for new permanent access.  She states has not been on dialysis in 30 years due to functioning transplant.  She has a failed left forearm loop graft as well as the left upper arm graft that she states were placed in [place] and never worked.  In reviewing documents here appear she has an SVC stent that is chronically occluded and IR attempted placement of upper extremity tunneled dialysis catheter yesterday and were unable to cross calcified chronic occlusion in the SVC where she currently has a stent in place.  That would suggest that her upper extremities likely not an option given central venous occlusion.  Other option would be femoral loop graft but appears IR also had trouble getting a wire in her IVC and suspected IVC stenosis versus occlusion.  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  She does have lower extremity edema on exam although duplexes are negative for DVT.  I discussed with Dr. Posey Pronto possibly getting a CT venogram to evaluate her IVC prior to making any plans for  lower extremity access but we agreed to delay this for 3 to 4 weeks and will keep her catheter dependent in the interim until we see how her kidney function recovers.  I would likely arrange follow-up with me as an outpatient.  May have limited options from our standpoint and would defer to IR to see if candidate for a translumbar drain again versus just catheter dependence.  Marty Heck, MD [facility] [place] Office: [phone]

## 2019-07-12 NOTE — Progress Notes (Signed)
[facility] cannot accommodate patient at this time. OP HD referral has been sent to [facility]. Renal Navigator will follow closely.  Alphonzo Cruise, [place] Renal Navigator [phone]

## 2019-07-12 NOTE — Progress Notes (Signed)
[facility] PROGRESS NOTE    Progress Note  Tina Watkins  ONG:[id number] DOB: 27-Sep-1965 DOA: 07/07/2019 PCP: [name], FNP     Brief Narrative:   Tina Watkins is an 54 y.o. female end-stage renal disease status post renal transplant in 2005, chronic kidney disease stage IV on chronic steroids and immunosuppressive therapy, chronic anemia in the setting of chronic renal disease and immunosuppression from chemotherapy with intermittent blood transfusions and regular EPO injections by hematology, TIA, CHF, moderate pulmonary hypertension presented with increased fatigue and low hemoglobin.  She was also noted to be in acute kidney injury. -Noted to have progressive renal failure with fluid overload and borderline uremic symptoms, seen by nephrology, started on HD  Assessment/Plan:   Nonoliguric Acute kidney injury on chronic kidney disease stage IV: -She presented fluid overloaded and with uremic symptoms. -Nephrology following, poor response to high-dose diuretics, also had mild uremic symptoms  -Underwent tunneled dialysis catheter placement on 6/8, started hemodialysis  -VVS consulted for permanent HD access -Will need to be clipped for permanent dialysis  Acute gout flare -Involving right ankle and big toe, increase prednisone dose, continue colchicine  Anemia of chronic disease -From CKD 4, worsened by bone marrow suppression from transplant meds -Iron stores are okay, given 1 unit of PRBC -Monitor hemoglobin, starting EPO  Status post renal transplant in 2005 Steroids tacrolimus, Imuran held due to cytopenias. Further management per nephrology.  Chronic hypotension Asymptomatic blood pressure is on the low side. -Continue midodrine, dose increased  Pancytopenia/neutropenia Multifactorial in the setting of Imuran hematology was consulted and recommended checking B12, copper, HIV which are negative -Imuran held  chronic diastolic  heart failure: -Volume managed with HD now -Last echo 01/2019 with restrictive cardiomyopathy, grade 3 diastolic dysfunction, preserved EF, moderate pulmonary hypertension   DVT prophylaxis: Lovenox Family Communication:none Status is: Inpatient  Remains inpatient appropriate because:Hemodynamically unstable, newly started on HD   Dispo: The patient is from: Home              Anticipated d/c is to: Home              Anticipated d/c date is: 2 to 3 days              Patient currently is not medically stable to d/c.   Code Status:     Code Status Orders  (From admission, onward)         Start     Ordered   07/07/19 1615  Full code  Continuous     07/07/19 1616        Code Status History    Date Active Date Inactive Code Status Order ID Comments User Context   05/09/2019 0706 05/18/2019 2040 Full Code [id number]  Shela Leff, MD ED   03/20/2019 1507 03/24/2019 2243 Full Code [id number]  Lequita Halt, MD Inpatient   11/20/2017 0236 11/20/2017 1916 Full Code [id number]  Ivor Costa, MD ED   08/09/2017 2312 08/17/2017 1950 Full Code [id number]  Kathrene Alu, MD Inpatient   09/29/2015 2318 10/03/2015 1841 Full Code [id number]  Reubin Milan, MD Inpatient   09/29/2015 2318 09/29/2015 2318 Full Code [id number]  Reubin Milan, MD Inpatient   01/08/2015 1637 01/10/2015 1918 Full Code [id number]  Leeroy Cha, MD Inpatient   06/13/2014 1008 06/16/2014 1800 Full Code [id number]  Orson Eva, MD Inpatient   04/10/2012 0109 04/11/2012 2008 Full Code [id number]  Derrill Kay, MD Inpatient   12/10/2011 2204 12/12/2011  [place] Full Code [id number]  Neomia Glass, RN Inpatient   05/22/2011 1520 05/30/2011 1553 Full Code [id number]  Minor, Grace Bushy, NP ED   Advance Care Planning Activity        IV Access:    Peripheral IV   Procedures and diagnostic studies:   IR Fluoro Guide CV Line Right  Result Date: 07/11/2019 CLINICAL DATA:  Failing renal transplant and need for hemodialysis.  History of multiple prior tunneled dialysis catheter placements and vascular access interventions including prior SVC stenting with documented SVC stent occlusion in 2001, tunneled femoral dialysis catheter placement in 8546-2703 complicated by IVC stenoses and translumbar IVC dialysis catheter placement in 5009 complicated by IVC stenosis. Request now made to try to place a tunneled dialysis catheter for current hemodialysis needs. Peripheral IV access is also needed for IV conscious sedation during the procedure and IV antibiotic administration. EXAM: 1. PERIPHERAL IV ACCESS VIA RIGHT BASILIC VEIN UNDER ULTRASOUND GUIDANCE 2. RIGHT INTERNAL JUGULAR VEIN ACCESS UNDER ULTRASOUND GUIDANCE 3. TUNNELED CENTRAL VENOUS HEMODIALYSIS CATHETER PLACEMENT VIA RIGHT COMMON FEMORAL VEIN WITH ULTRASOUND AND FLUOROSCOPIC GUIDANCE ANESTHESIA/SEDATION: 1.0 mg IV Versed; 25 mcg IV Fentanyl. Total Moderate Sedation Time:   35 minutes. The patient's level of consciousness and physiologic status were continuously monitored during the procedure by Radiology nursing. MEDICATIONS: 2 g IV Ancef. FLUOROSCOPY TIME:  3 minutes and 24 seconds.  18 mGy. PROCEDURE: The procedure, risks, benefits, and alternatives were explained to the patient. Questions regarding the procedure were encouraged and answered. The patient understands and consents to the procedure. A timeout was performed prior to initiating the procedure. Ultrasound was used to confirm patency of the right basilic vein in the upper arm. Skin of the right medial upper arm was prepped with chlorhexidine. 1% lidocaine was infiltrated at the level of the skin and subcutaneous tissues. Under ultrasound guidance, a 21 gauge needle was advanced into the right basilic vein. A guidewire was advanced. A 5 French micropuncture dilator was then advanced into the vein. This was capped and secured for IV access during the procedure for IV antibiotic administration and IV conscious sedation.  Preliminary ultrasound was performed of the right and left neck and right groin. Ultrasound was used to confirm patency of the right internal jugular vein and right common femoral vein. The right neck and right groin were prepped with chlorhexidine in a sterile fashion, and a sterile drape was applied covering the operative field. Maximum barrier sterile technique with sterile gowns and gloves were used for the procedure. Local anesthesia was provided with 1% lidocaine. A 21 gauge needle was advanced into the right internal jugular vein under direct, real-time ultrasound guidance. A guidewire was advanced. A transitional dilator was placed. Over a guidewire a 5 [place] catheter was then advanced. Attempt was made to advance the catheter into the SVC over a guidewire. Fluoroscopic spot images were obtained. Access of the right internal jugular vein was then abandoned and hemostasis obtained with manual compression. Under ultrasound guidance, a 21 gauge needle was advanced into the right common femoral vein. Ultrasound image documentation was performed. A venotomy incision was made in the right groin region. After securing guidewire access, a 5 French catheter was advanced over a guidewire. Guidewire advancement was performed into the inferior vena cava. A wire was kinked to measure appropriate tunneled dialysis catheter length. A Palindrome tunneled hemodialysis catheter measuring 28 cm from tip to cuff was chosen for placement. This was tunneled in a retrograde fashion from the right thigh  to the venotomy incision. At the venotomy, serial dilatation was performed and a 16 Fr peel-away sheath was placed over a guidewire. The catheter was then placed through the sheath and the sheath removed. Final catheter positioning was confirmed and documented with a fluoroscopic spot image. The catheter was aspirated, flushed with saline, and injected with appropriate volume heparin dwells. The venotomy incision was closed with  subcuticular 4-0 Vicryl. Dermabond was applied to the incision. The catheter exit site was secured with a Prolene retention suture. COMPLICATIONS: None. FINDINGS: Initial ultrasound demonstrates a patent right internal jugular vein. There is no patent left internal jugular vein present with multiple small collateral veins present in the left neck. The right common femoral vein is normally patent in the right groin. Initial access of the right internal jugular vein was performed. A guidewire would not advance beyond the confluence of the brachiocephalic veins at the origin of the upper SVC. 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 Confirmation of venous occlusion was performed with a catheter and guidewire which could not be advanced below the level of chronic venous occlusion. The patient is not a candidate for future tunnel catheter placements in the chest due to chronic IVC occlusion. The right common femoral vein was accessed. A guidewire initially advanced through the common femoral and external iliac veins easily and met some resistance at the confluence of the common iliac veins and expected lower IVC. A 5 French catheter was able to be advanced over a guidewire to about the level of the mid IVC but there was further resistance to guidewire and catheter passage at this level likely on the basis of chronic stenosis of the IVC. This is also near the location of previous translumbar IVC access for catheter placement. After right femoral tunneled catheter placement, the tip lies in the lower IVC. The catheter aspirates well and is ready for immediate use. Flow rates may be somewhat limited given probable IVC stenosis above the level of the tip of the dialysis catheter. However, this is the best access possible currently for a tunneled catheter and other  access sites will be quite limited given findings today and prior history. IMPRESSION: 1. Ultrasound-guided venous access of the right basilic vein in the upper arm performed for IV antibiotic administration and IV conscious sedation during dialysis catheter placement. 2. 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 The patient is not a candidate for future catheter placement in the chest given chronic SVC occlusion. 3. Patent right common femoral vein allowing placement of a tunneled dialysis catheter. This catheter was advanced to the level of the lower IVC and measures 28 cm from tip to cuff. A longer catheter was not able to be placed given catheter and guidewire evidence of significant mid IVC stenosis or occlusion based on restricted ability to advance a catheter and guidewire. Electronically Signed   By: Aletta Edouard M.D.   On: 07/11/2019 16:49   IR US Guide Vasc Access Right  Result Date: 07/11/2019 CLINICAL DATA:  Failing renal transplant and need for hemodialysis. History of multiple prior tunneled dialysis catheter placements and vascular access interventions including  prior SVC stenting with documented SVC stent occlusion in 2001, tunneled femoral dialysis catheter placement in 4562-5638 complicated by IVC stenoses and translumbar IVC dialysis catheter placement in 9373 complicated by IVC stenosis. Request now made to try to place a tunneled dialysis catheter for current hemodialysis needs. Peripheral IV access is also needed for IV conscious sedation during the procedure and IV antibiotic administration. EXAM: 1. PERIPHERAL IV ACCESS VIA RIGHT BASILIC VEIN UNDER ULTRASOUND GUIDANCE 2. RIGHT INTERNAL JUGULAR VEIN ACCESS UNDER  ULTRASOUND GUIDANCE 3. TUNNELED CENTRAL VENOUS HEMODIALYSIS CATHETER PLACEMENT VIA RIGHT COMMON FEMORAL VEIN WITH ULTRASOUND AND FLUOROSCOPIC GUIDANCE ANESTHESIA/SEDATION: 1.0 mg IV Versed; 25 mcg IV Fentanyl. Total Moderate Sedation Time:   35 minutes. The patient's level of consciousness and physiologic status were continuously monitored during the procedure by Radiology nursing. MEDICATIONS: 2 g IV Ancef. FLUOROSCOPY TIME:  3 minutes and 24 seconds.  18 mGy. PROCEDURE: The procedure, risks, benefits, and alternatives were explained to the patient. Questions regarding the procedure were encouraged and answered. The patient understands and consents to the procedure. A timeout was performed prior to initiating the procedure. Ultrasound was used to confirm patency of the right basilic vein in the upper arm. Skin of the right medial upper arm was prepped with chlorhexidine. 1% lidocaine was infiltrated at the level of the skin and subcutaneous tissues. Under ultrasound guidance, a 21 gauge needle was advanced into the right basilic vein. A guidewire was advanced. A 5 French micropuncture dilator was then advanced into the vein. This was capped and secured for IV access during the procedure for IV antibiotic administration and IV conscious sedation. Preliminary ultrasound was performed of the right and left neck and right groin. Ultrasound was used to confirm patency of the right internal jugular vein and right common femoral vein. The right neck and right groin were prepped with chlorhexidine in a sterile fashion, and a sterile drape was applied covering the operative field. Maximum barrier sterile technique with sterile gowns and gloves were used for the procedure. Local anesthesia was provided with 1% lidocaine. A 21 gauge needle was advanced into the right internal jugular vein under direct, real-time ultrasound guidance. A guidewire was advanced. A transitional dilator was placed. Over a guidewire a 5 [place]  catheter was then advanced. Attempt was made to advance the catheter into the SVC over a guidewire. Fluoroscopic spot images were obtained. Access of the right internal jugular vein was then abandoned and hemostasis obtained with manual compression. Under ultrasound guidance, a 21 gauge needle was advanced into the right common femoral vein. Ultrasound image documentation was performed. A venotomy incision was made in the right groin region. After securing guidewire access, a 5 French catheter was advanced over a guidewire. Guidewire advancement was performed into the inferior vena cava. A wire was kinked to measure appropriate tunneled dialysis catheter length. A Palindrome tunneled hemodialysis catheter measuring 28 cm from tip to cuff was chosen for placement. This was tunneled in a retrograde fashion from the right thigh to the venotomy incision. At the venotomy, serial dilatation was performed and a 16 Fr peel-away sheath was placed over a guidewire. The catheter was then placed through the sheath and the sheath removed. Final catheter positioning was confirmed and documented with a fluoroscopic spot image. The catheter was aspirated, flushed with saline, and injected with appropriate volume heparin dwells. The venotomy incision was closed with subcuticular 4-0 Vicryl. Dermabond was applied to the incision. The catheter exit site was secured with a Prolene retention suture.  COMPLICATIONS: None. FINDINGS: Initial ultrasound demonstrates a patent right internal jugular vein. There is no patent left internal jugular vein present with multiple small collateral veins present in the left neck. The right common femoral vein is normally patent in the right groin. Initial access of the right internal jugular vein was performed. A guidewire would not advance beyond the confluence of the brachiocephalic veins at the origin of the upper SVC. Under fluoroscopy, there is clearly a large amount of calcification within the  upper SVC likely reflecting calcified chronic thrombus which is present above the level of an indwelling lower IVC stent. Confirmation of venous occlusion was performed with a catheter and guidewire which could not be advanced below the level of chronic venous occlusion. The patient is not a candidate for future tunnel catheter placements in the chest due to chronic IVC occlusion. The right common femoral vein was accessed. A guidewire initially advanced through the common femoral and external iliac veins easily and met some resistance at the confluence of the common iliac veins and expected lower IVC. A 5 French catheter was able to be advanced over a guidewire to about the level of the mid IVC but there was further resistance to guidewire and catheter passage at this level likely on the basis of chronic stenosis of the IVC. This is also near the location of previous translumbar IVC access for catheter placement. After right femoral tunneled catheter placement, the tip lies in the lower IVC. The catheter aspirates well and is ready for immediate use. Flow rates may be somewhat limited given probable IVC stenosis above the level of the tip of the dialysis catheter. However, this is the best access possible currently for a tunneled catheter and other access sites will be quite limited given findings today and prior history. IMPRESSION: 1. Ultrasound-guided venous access of the right basilic vein in the upper arm performed for IV antibiotic administration and IV conscious sedation during dialysis catheter placement. 2. 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 The patient is not a candidate for future catheter placement in the chest given  chronic SVC occlusion. 3. Patent right common femoral vein allowing placement of a tunneled dialysis catheter. This catheter was advanced to the level of the lower IVC and measures 28 cm from tip to cuff. A longer catheter was not able to be placed given catheter and guidewire evidence of significant mid IVC stenosis or occlusion based on restricted ability to advance a catheter and guidewire. Electronically Signed   By: Aletta Edouard M.D.   On: 07/11/2019 16:49   IR US Guide Vasc Access Right  Result Date: 07/11/2019 CLINICAL DATA:  Failing renal transplant and need for hemodialysis. History of multiple prior tunneled dialysis catheter placements and vascular access interventions including prior SVC stenting with documented SVC stent occlusion in 2001, tunneled femoral dialysis catheter placement in 7341-9379 complicated by IVC stenoses and translumbar IVC dialysis catheter placement in 0240 complicated by IVC stenosis. Request now made to try to place a tunneled dialysis catheter for current hemodialysis needs. Peripheral IV access is also needed for IV conscious sedation during the procedure and IV antibiotic administration. EXAM: 1. PERIPHERAL IV ACCESS VIA RIGHT BASILIC VEIN UNDER ULTRASOUND GUIDANCE 2. RIGHT INTERNAL JUGULAR VEIN ACCESS UNDER ULTRASOUND GUIDANCE  3. TUNNELED CENTRAL VENOUS HEMODIALYSIS CATHETER PLACEMENT VIA RIGHT COMMON FEMORAL VEIN WITH ULTRASOUND AND FLUOROSCOPIC GUIDANCE ANESTHESIA/SEDATION: 1.0 mg IV Versed; 25 mcg IV Fentanyl. Total Moderate Sedation Time:   35 minutes. The patient's level of consciousness and physiologic status were continuously monitored during the procedure by Radiology nursing. MEDICATIONS: 2 g IV Ancef. FLUOROSCOPY TIME:  3 minutes and 24 seconds.  18 mGy. PROCEDURE: The procedure, risks, benefits, and alternatives were explained to the patient. Questions regarding the procedure were encouraged and answered. The patient understands and consents to the procedure.  A timeout was performed prior to initiating the procedure. Ultrasound was used to confirm patency of the right basilic vein in the upper arm. Skin of the right medial upper arm was prepped with chlorhexidine. 1% lidocaine was infiltrated at the level of the skin and subcutaneous tissues. Under ultrasound guidance, a 21 gauge needle was advanced into the right basilic vein. A guidewire was advanced. A 5 French micropuncture dilator was then advanced into the vein. This was capped and secured for IV access during the procedure for IV antibiotic administration and IV conscious sedation. Preliminary ultrasound was performed of the right and left neck and right groin. Ultrasound was used to confirm patency of the right internal jugular vein and right common femoral vein. The right neck and right groin were prepped with chlorhexidine in a sterile fashion, and a sterile drape was applied covering the operative field. Maximum barrier sterile technique with sterile gowns and gloves were used for the procedure. Local anesthesia was provided with 1% lidocaine. A 21 gauge needle was advanced into the right internal jugular vein under direct, real-time ultrasound guidance. A guidewire was advanced. A transitional dilator was placed. Over a guidewire a 5 [place] catheter was then advanced. Attempt was made to advance the catheter into the SVC over a guidewire. Fluoroscopic spot images were obtained. Access of the right internal jugular vein was then abandoned and hemostasis obtained with manual compression. Under ultrasound guidance, a 21 gauge needle was advanced into the right common femoral vein. Ultrasound image documentation was performed. A venotomy incision was made in the right groin region. After securing guidewire access, a 5 French catheter was advanced over a guidewire. Guidewire advancement was performed into the inferior vena cava. A wire was kinked to measure appropriate tunneled dialysis catheter length. A  Palindrome tunneled hemodialysis catheter measuring 28 cm from tip to cuff was chosen for placement. This was tunneled in a retrograde fashion from the right thigh to the venotomy incision. At the venotomy, serial dilatation was performed and a 16 Fr peel-away sheath was placed over a guidewire. The catheter was then placed through the sheath and the sheath removed. Final catheter positioning was confirmed and documented with a fluoroscopic spot image. The catheter was aspirated, flushed with saline, and injected with appropriate volume heparin dwells. The venotomy incision was closed with subcuticular 4-0 Vicryl. Dermabond was applied to the incision. The catheter exit site was secured with a Prolene retention suture. COMPLICATIONS: None. FINDINGS: Initial ultrasound demonstrates a patent right internal jugular vein. There is no patent left internal jugular vein present with multiple small collateral veins present in the left neck. The right common femoral vein is normally patent in the right groin. Initial access of the right internal jugular vein was performed. A guidewire would not advance beyond the confluence of the brachiocephalic veins at the origin of the upper SVC. Under fluoroscopy, there is clearly a large amount of calcification within the upper SVC  likely reflecting calcified chronic thrombus which is present above the level of an indwelling lower IVC stent. Confirmation of venous occlusion was performed with a catheter and guidewire which could not be advanced below the level of chronic venous occlusion. The patient is not a candidate for future tunnel catheter placements in the chest due to chronic IVC occlusion. The right common femoral vein was accessed. A guidewire initially advanced through the common femoral and external iliac veins easily and met some resistance at the confluence of the common iliac veins and expected lower IVC. A 5 French catheter was able to be advanced over a guidewire to  about the level of the mid IVC but there was further resistance to guidewire and catheter passage at this level likely on the basis of chronic stenosis of the IVC. This is also near the location of previous translumbar IVC access for catheter placement. After right femoral tunneled catheter placement, the tip lies in the lower IVC. The catheter aspirates well and is ready for immediate use. Flow rates may be somewhat limited given probable IVC stenosis above the level of the tip of the dialysis catheter. However, this is the best access possible currently for a tunneled catheter and other access sites will be quite limited given findings today and prior history. IMPRESSION: 1. Ultrasound-guided venous access of the right basilic vein in the upper arm performed for IV antibiotic administration and IV conscious sedation during dialysis catheter placement. 2. 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 The patient is not a candidate for future catheter placement in the chest given chronic SVC occlusion. 3. Patent right common femoral vein allowing placement of a tunneled dialysis catheter. This catheter was advanced to the level of the lower IVC and measures 28 cm from tip to cuff. A longer catheter was not able to be placed given catheter and guidewire evidence of significant mid IVC stenosis or occlusion based on restricted ability to advance a catheter and guidewire. Electronically Signed   By: Aletta Edouard M.D.   On: 07/11/2019 16:49   IR US Guide Vasc Access Right  Result Date: 07/11/2019 CLINICAL DATA:  Failing renal transplant and need for hemodialysis. History of multiple prior tunneled dialysis catheter placements and vascular  access interventions including prior SVC stenting with documented SVC stent occlusion in 2001, tunneled femoral dialysis catheter placement in [id number] complicated by IVC stenoses and translumbar IVC dialysis catheter placement in 7616 complicated by IVC stenosis. Request now made to try to place a tunneled dialysis catheter for current hemodialysis needs. Peripheral IV access is also needed for IV conscious sedation during the procedure and IV antibiotic administration. EXAM: 1. PERIPHERAL IV ACCESS VIA RIGHT BASILIC VEIN UNDER ULTRASOUND GUIDANCE 2. RIGHT INTERNAL JUGULAR VEIN ACCESS UNDER ULTRASOUND GUIDANCE 3. TUNNELED CENTRAL VENOUS HEMODIALYSIS CATHETER PLACEMENT VIA RIGHT COMMON FEMORAL VEIN WITH ULTRASOUND AND FLUOROSCOPIC GUIDANCE ANESTHESIA/SEDATION: 1.0 mg IV Versed; 25 mcg IV Fentanyl. Total Moderate Sedation Time:   35 minutes. The patient's level of consciousness and physiologic status were continuously monitored during the procedure by Radiology nursing. MEDICATIONS: 2 g IV Ancef. FLUOROSCOPY TIME:  3 minutes and 24 seconds.  18 mGy. PROCEDURE: The procedure, risks, benefits, and alternatives were explained to the patient. Questions regarding the procedure were encouraged and  answered. The patient understands and consents to the procedure. A timeout was performed prior to initiating the procedure. Ultrasound was used to confirm patency of the right basilic vein in the upper arm. Skin of the right medial upper arm was prepped with chlorhexidine. 1% lidocaine was infiltrated at the level of the skin and subcutaneous tissues. Under ultrasound guidance, a 21 gauge needle was advanced into the right basilic vein. A guidewire was advanced. A 5 French micropuncture dilator was then advanced into the vein. This was capped and secured for IV access during the procedure for IV antibiotic administration and IV conscious sedation. Preliminary ultrasound was performed of the right and left neck and right groin.  Ultrasound was used to confirm patency of the right internal jugular vein and right common femoral vein. The right neck and right groin were prepped with chlorhexidine in a sterile fashion, and a sterile drape was applied covering the operative field. Maximum barrier sterile technique with sterile gowns and gloves were used for the procedure. Local anesthesia was provided with 1% lidocaine. A 21 gauge needle was advanced into the right internal jugular vein under direct, real-time ultrasound guidance. A guidewire was advanced. A transitional dilator was placed. Over a guidewire a 5 [place] catheter was then advanced. Attempt was made to advance the catheter into the SVC over a guidewire. Fluoroscopic spot images were obtained. Access of the right internal jugular vein was then abandoned and hemostasis obtained with manual compression. Under ultrasound guidance, a 21 gauge needle was advanced into the right common femoral vein. Ultrasound image documentation was performed. A venotomy incision was made in the right groin region. After securing guidewire access, a 5 French catheter was advanced over a guidewire. Guidewire advancement was performed into the inferior vena cava. A wire was kinked to measure appropriate tunneled dialysis catheter length. A Palindrome tunneled hemodialysis catheter measuring 28 cm from tip to cuff was chosen for placement. This was tunneled in a retrograde fashion from the right thigh to the venotomy incision. At the venotomy, serial dilatation was performed and a 16 Fr peel-away sheath was placed over a guidewire. The catheter was then placed through the sheath and the sheath removed. Final catheter positioning was confirmed and documented with a fluoroscopic spot image. The catheter was aspirated, flushed with saline, and injected with appropriate volume heparin dwells. The venotomy incision was closed with subcuticular 4-0 Vicryl. Dermabond was applied to the incision. The catheter exit  site was secured with a Prolene retention suture. COMPLICATIONS: None. FINDINGS: Initial ultrasound demonstrates a patent right internal jugular vein. There is no patent left internal jugular vein present with multiple small collateral veins present in the left neck. The right common femoral vein is normally patent in the right groin. Initial access of the right internal jugular vein was performed. A guidewire would not advance beyond the confluence of the brachiocephalic veins at the origin of the upper SVC. 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 Confirmation of venous occlusion was performed with a catheter and guidewire which could not be advanced below the level of chronic venous occlusion. The patient is not a candidate for future tunnel catheter placements in the chest due to chronic IVC occlusion. The right common femoral vein was accessed. A guidewire initially advanced through the common femoral and external iliac veins easily and met some resistance at the confluence  of the common iliac veins and expected lower IVC. A 5 French catheter was able to be advanced over a guidewire to about the level of the mid IVC but there was further resistance to guidewire and catheter passage at this level likely on the basis of chronic stenosis of the IVC. This is also near the location of previous translumbar IVC access for catheter placement. After right femoral tunneled catheter placement, the tip lies in the lower IVC. The catheter aspirates well and is ready for immediate use. Flow rates may be somewhat limited given probable IVC stenosis above the level of the tip of the dialysis catheter. However, this is the best access possible currently for a tunneled catheter and other access sites will be quite limited given findings today and prior history.  IMPRESSION: 1. Ultrasound-guided venous access of the right basilic vein in the upper arm performed for IV antibiotic administration and IV conscious sedation during dialysis catheter placement. 2. 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 The patient is not a candidate for future catheter placement in the chest given chronic SVC occlusion. 3. Patent right common femoral vein allowing placement of a tunneled dialysis catheter. This catheter was advanced to the level of the lower IVC and measures 28 cm from tip to cuff. A longer catheter was not able to be placed given catheter and guidewire evidence of significant mid IVC stenosis or occlusion based on restricted ability to advance a catheter and guidewire. Electronically Signed   By: Aletta Edouard M.D.   On: 07/11/2019 16:49   IR RADIOLOGY PERIPHERAL GUIDED IV START  Result Date: 07/11/2019 CLINICAL DATA:  Failing renal transplant and need for hemodialysis. History of multiple prior tunneled dialysis catheter placements and vascular access interventions including prior SVC stenting with documented SVC stent occlusion in 2001, tunneled femoral dialysis catheter placement in [id number] complicated by IVC stenoses and translumbar IVC dialysis catheter placement in 1937 complicated by IVC stenosis. Request now made to try to place a tunneled dialysis catheter for current hemodialysis needs. Peripheral IV access is also needed for IV conscious sedation during the procedure and IV antibiotic administration. EXAM: 1. PERIPHERAL IV ACCESS VIA RIGHT BASILIC VEIN UNDER ULTRASOUND GUIDANCE 2. RIGHT INTERNAL JUGULAR VEIN ACCESS UNDER ULTRASOUND GUIDANCE 3. TUNNELED CENTRAL VENOUS HEMODIALYSIS  CATHETER PLACEMENT VIA RIGHT COMMON FEMORAL VEIN WITH ULTRASOUND AND FLUOROSCOPIC GUIDANCE ANESTHESIA/SEDATION: 1.0 mg IV Versed; 25 mcg IV Fentanyl. Total Moderate Sedation Time:   35 minutes. The patient's level of consciousness and physiologic status were continuously monitored during the procedure by Radiology nursing. MEDICATIONS: 2 g IV Ancef. FLUOROSCOPY TIME:  3 minutes and 24 seconds.  18 mGy. PROCEDURE: The procedure, risks, benefits, and alternatives were explained to the patient. Questions regarding the procedure were encouraged and answered. The patient understands and consents to the procedure. A timeout was performed prior to initiating the procedure. Ultrasound was used to confirm patency of the right basilic vein in the upper arm. Skin of the right medial upper arm was prepped with chlorhexidine. 1% lidocaine was infiltrated at the level of the skin and subcutaneous tissues. Under ultrasound guidance, a 21 gauge needle was advanced into the right basilic vein. A guidewire was advanced. A 5 French micropuncture dilator was then advanced into the vein.  This was capped and secured for IV access during the procedure for IV antibiotic administration and IV conscious sedation. Preliminary ultrasound was performed of the right and left neck and right groin. Ultrasound was used to confirm patency of the right internal jugular vein and right common femoral vein. The right neck and right groin were prepped with chlorhexidine in a sterile fashion, and a sterile drape was applied covering the operative field. Maximum barrier sterile technique with sterile gowns and gloves were used for the procedure. Local anesthesia was provided with 1% lidocaine. A 21 gauge needle was advanced into the right internal jugular vein under direct, real-time ultrasound guidance. A guidewire was advanced. A transitional dilator was placed. Over a guidewire a 5 [place] catheter was then advanced. Attempt was made to advance the  catheter into the SVC over a guidewire. Fluoroscopic spot images were obtained. Access of the right internal jugular vein was then abandoned and hemostasis obtained with manual compression. Under ultrasound guidance, a 21 gauge needle was advanced into the right common femoral vein. Ultrasound image documentation was performed. A venotomy incision was made in the right groin region. After securing guidewire access, a 5 French catheter was advanced over a guidewire. Guidewire advancement was performed into the inferior vena cava. A wire was kinked to measure appropriate tunneled dialysis catheter length. A Palindrome tunneled hemodialysis catheter measuring 28 cm from tip to cuff was chosen for placement. This was tunneled in a retrograde fashion from the right thigh to the venotomy incision. At the venotomy, serial dilatation was performed and a 16 Fr peel-away sheath was placed over a guidewire. The catheter was then placed through the sheath and the sheath removed. Final catheter positioning was confirmed and documented with a fluoroscopic spot image. The catheter was aspirated, flushed with saline, and injected with appropriate volume heparin dwells. The venotomy incision was closed with subcuticular 4-0 Vicryl. Dermabond was applied to the incision. The catheter exit site was secured with a Prolene retention suture. COMPLICATIONS: None. FINDINGS: Initial ultrasound demonstrates a patent right internal jugular vein. There is no patent left internal jugular vein present with multiple small collateral veins present in the left neck. The right common femoral vein is normally patent in the right groin. Initial access of the right internal jugular vein was performed. A guidewire would not advance beyond the confluence of the brachiocephalic veins at the origin of the upper SVC. Under fluoroscopy, there is clearly a large amount of calcification within the upper SVC likely reflecting calcified chronic thrombus which is  present above the level of an indwelling lower IVC stent. Confirmation of venous occlusion was performed with a catheter and guidewire which could not be advanced below the level of chronic venous occlusion. The patient is not a candidate for future tunnel catheter placements in the chest due to chronic IVC occlusion. The right common femoral vein was accessed. A guidewire initially advanced through the common femoral and external iliac veins easily and met some resistance at the confluence of the common iliac veins and expected lower IVC. A 5 French catheter was able to be advanced over a guidewire to about the level of the mid IVC but there was further resistance to guidewire and catheter passage at this level likely on the basis of chronic stenosis of the IVC. This is also near the location of previous translumbar IVC access for catheter placement. After right femoral tunneled catheter placement, the tip lies in the lower IVC. The catheter aspirates well and is ready  for immediate use. Flow rates may be somewhat limited given probable IVC stenosis above the level of the tip of the dialysis catheter. However, this is the best access possible currently for a tunneled catheter and other access sites will be quite limited given findings today and prior history. IMPRESSION: 1. Ultrasound-guided venous access of the right basilic vein in the upper arm performed for IV antibiotic administration and IV conscious sedation during dialysis catheter placement. 2. 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 The patient is not a candidate for future catheter placement in the chest given chronic SVC occlusion. 3. Patent right common femoral vein allowing  placement of a tunneled dialysis catheter. This catheter was advanced to the level of the lower IVC and measures 28 cm from tip to cuff. A longer catheter was not able to be placed given catheter and guidewire evidence of significant mid IVC stenosis or occlusion based on restricted ability to advance a catheter and guidewire. Electronically Signed   By: Aletta Edouard M.D.   On: 07/11/2019 16:49     Medical Consultants:    None.  Anti-Infectives:   none  Subjective:    Tina Watkins no new complaints.  Objective:    Vitals:   07/12/19 1230 07/12/19 1315 07/12/19 1317 07/12/19 1330  BP: (!) 97/53 (!) 99/51 (!) 105/55 (!) 107/53  Pulse: 71 75 69 66  Resp: 14 16    Temp: 99.3 F (37.4 C) 99.5 F (37.5 C)    TempSrc: Oral Oral    SpO2: 91% 93%    Weight:  60.8 kg    Height:       SpO2: 93 % O2 Flow Rate (L/min): 2 L/min   Intake/Output Summary (Last 24 hours) at 07/12/2019 1355 Last data filed at 07/11/2019 2300 Gross per 24 hour  Intake 50 ml  Output 1405 ml  Net -1355 ml   Filed Weights   07/11/19 1551 07/11/19 1823 07/12/19 1315  Weight: 61.2 kg 60.2 kg 60.8 kg    Exam: General exam: Chronically ill female appears much older than stated age, AAO x2, uncomfortable appearing Respiratory system: Few basilar rales  cardiovascular system: S1 & S2 heard, RRR Gastrointestinal system: Abdomen is nondistended, soft and nontender.  Central nervous system: Moves all extremities, no localizing signs Extremities: 1+ edema, swelling and tenderness of right ankle and first MTP joint Skin: No rashes on exposed skin Psychiatry: Flat affect  Data Reviewed:    Labs: Basic Metabolic Panel: Recent Labs  Lab 07/08/19 0350 07/08/19 0350 07/09/19 0312 07/09/19 0312 07/10/19 0415 07/10/19 0415 07/11/19 0422 07/12/19 0326  NA 137  --  139  --  137  --  137 139  K 5.0   < > 5.3*   < > 4.7   < > 5.0 4.4  CL 106  --  105  --  106  --  101 104  CO2 20*  --  19*   --  19*  --  22 22  GLUCOSE 82  --  80  --  80  --  88 87  BUN 82*  --  84*  --  85*  --  87* 50*  CREATININE 5.61*  --  5.33*  --  5.56*  --  5.86* 4.40*  CALCIUM 9.1  --  9.3  --  9.1  --  9.4 8.9  PHOS  --   --  6.0*  --  5.9*  --   --   --    < > = values in this interval not displayed.   GFR Estimated Creatinine Clearance: 12 mL/min (A) (by C-G formula based on SCr of 4.4 mg/dL (H)). Liver Function Tests: Recent Labs  Lab 07/07/19 1033 07/09/19 0312 07/10/19 0415 07/11/19 0422 07/12/19 0326  AST 30  --  '24 21 24  '$ ALT 14  --  '11 13 12  '$ ALKPHOS 125  --  116 112 111  BILITOT 1.4*  --  1.5* 1.5* 1.8*  PROT 5.9*  --  5.7* 6.0* 6.1*  ALBUMIN 3.5 3.4* 3.5 3.6 3.6   Recent Labs  Lab 07/07/19 1033  LIPASE 44   No results for input(s): AMMONIA in the last 168 hours. Coagulation profile Recent Labs  Lab 07/11/19 0945  INR 1.1   COVID-19 Labs  No results for input(s): DDIMER, FERRITIN, LDH, CRP in the last 72 hours.  Lab Results  Component Value Date   SARSCOV2NAA POSITIVE (A) 05/09/2019   [place] NEGATIVE 03/17/2019    CBC: Recent Labs  Lab 07/07/19 1033 07/08/19 0350 07/09/19 0312 07/09/19 1037 07/10/19 0415 07/11/19 0422 07/12/19 0326  WBC 1.9*   < > 1.4* 1.5* 1.3* 1.7* 1.8*  NEUTROABS 1.3*  --   --  1.0* 1.0* 1.1* 1.0*  HGB 7.5*   < > 8.1* 8.1* 7.9* 7.7* 8.3*  HCT 24.0*   < > 25.2* 25.5* 24.2* 24.0* 25.6*  MCV 110.1*   < > 102.4* 103.7* 102.1* 103.4* 103.2*  PLT 146*   < > 121* 125* 115* 133* 134*   < > = values in this interval not displayed.   Cardiac Enzymes: No results for input(s): CKTOTAL, CKMB, CKMBINDEX, TROPONINI in the last 168 hours. BNP (last 3 results) No results for input(s): PROBNP in the last 8760 hours. CBG: No results for input(s): GLUCAP in the last 168 hours. D-Dimer: No results for input(s): DDIMER in the last 72 hours. Hgb A1c: No results for input(s): HGBA1C in the last 72 hours. Lipid Profile: No results for  input(s): CHOL, HDL, LDLCALC, TRIG, CHOLHDL, LDLDIRECT in the last 72 hours. Thyroid function studies: No results for input(s): TSH, T4TOTAL, T3FREE, THYROIDAB in the last 72 hours.  Invalid input(s): FREET3 Anemia work up: No results for input(s): VITAMINB12, FOLATE, FERRITIN, TIBC, IRON, RETICCTPCT in the last 72 hours. Sepsis Labs: Recent Labs  Lab 07/09/19 1037 07/10/19 0415 07/11/19 0422 07/12/19 0326  WBC 1.5* 1.3* 1.7* 1.8*   Microbiology No results found for this or any previous visit (from the past 240 hour(s)).   Medications:   . aspirin EC  81 mg Oral Daily  . [START ON 07/15/2019] colchicine  0.6 mg Oral Q72H  . darbepoetin (ARANESP) injection - NON-DIALYSIS  100 mcg Subcutaneous Q Mon-1800  . famotidine  20 mg Oral Daily  . gabapentin  100 mg Oral BID  . heparin  5,000 Units Subcutaneous Q8H  . latanoprost  1 drop Left Eye QHS  . midodrine  10 mg Oral TID WC  . pneumococcal 23 valent vaccine  0.5 mL Intramuscular Tomorrow-1000  . predniSONE  40 mg Oral Daily  . sevelamer carbonate  800 mg Oral TID WC  . tacrolimus  2 mg Oral QHS  . tacrolimus  3 mg Oral q morning - 10a  .  Vitamin D (Ergocalciferol)  50,000 Units Oral Q Sun   Continuous Infusions:     LOS: 5 days   Domenic Polite  [facility]  07/12/2019, 1:55 PM

## 2019-07-12 NOTE — TOC Initial Note (Signed)
Transition of Care [facility] - Initial/Assessment Note    Patient Details  Name: Tina Watkins MRN: [id number] Date of Birth: 01-25-1966  Transition of Care [facility]) CM/SW Contact:    Sharin Mons, RN Phone Number: [phone] 07/12/2019, 10:26 AM  Clinical Narrative:     Admitted with AKI, ANEMIA. Hx of  ESRD , s/p renal transplant, CKD stage IV ,chronic anemia, HTN, TIA, CHF, pulmonary hypertension, COVID-19 2 months ago.       From home alone. States has supportive uncle. PTA independent with ADL's , no usage of assistive devices.      Hospital course with noted worsening KF. Plan: IR for [facility], vein mapping V[facility] Renal navigator following for CLIP.   TOC team will continue to monitor for needs ....  Expected Discharge Plan: Home/Self Care Barriers to Discharge: Continued Medical Work up   Patient Goals and CMS Choice        Expected Discharge Plan and Services Expected Discharge Plan: Home/Self Care   Discharge Planning Services: CM Consult                                          Prior Living Arrangements/Services   Lives with:: Self   Do you feel safe going back to the place where you live?: Yes            Criminal Activity/Legal Involvement Pertinent to Current Situation/Hospitalization: No - Comment as needed  Activities of Daily Living Home Assistive Devices/Equipment: Bedside commode/3-in-1, Shower chair with back, Walker (specify type) ADL Screening (condition at time of admission) Patient's cognitive ability adequate to safely complete daily activities?: Yes Is the patient deaf or have difficulty hearing?: No Does the patient have difficulty seeing, even when wearing glasses/contacts?: No Does the patient have difficulty concentrating, remembering, or making decisions?: No Patient able to express need for assistance with ADLs?: Yes Does the patient have difficulty dressing or bathing?: Yes Independently performs ADLs?: Yes  (appropriate for developmental age) Does the patient have difficulty walking or climbing stairs?: Yes Weakness of Legs: Both Weakness of Arms/Hands: Both  Permission Sought/Granted                  Emotional Assessment       Orientation: : Oriented to Self, Oriented to Place, Oriented to  Time, Oriented to Situation Alcohol / Substance Use: Not Applicable Psych Involvement: No (comment)  Admission diagnosis:  Fatigue [R53.83] Anemia [D64.9] AKI (acute kidney injury) ([place]) [N17.9] Symptomatic anemia [D64.9] Acute renal failure, unspecified acute renal failure type ([place]) [N17.9] Renal transplant, status post [Z94.0] Patient Active Problem List   Diagnosis Date Noted  . Weakness 06/13/2019  . Pneumonia due to COVID-19 virus 05/09/2019  . Hypotension 05/09/2019  . Symptomatic anemia 05/09/2019  . CHF (congestive heart failure) ([place]) 05/09/2019  . Swollen abdomen 01/04/2019  . DDD (degenerative disc disease), cervical 12/06/2018  . Neuropathy 12/06/2018  . Paresthesia 10/11/2018  . Neck pain 10/11/2018  . Tobacco abuse 11/20/2017  . Right leg swelling 11/20/2017  . CKD (chronic kidney disease), stage III 11/20/2017  . Right leg pain 11/20/2017  . Stroke (cerebrum) ([place]) 11/20/2017  . GERD (gastroesophageal reflux disease) 11/20/2017  . Acute venous embolism and thrombosis of deep vessels of proximal lower extremity, right ([place]) 11/20/2017  . 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 Thrush, oral   .  AKI (acute kidney injury) ([place])   . Multifocal pneumonia 08/09/2017  . Chest pain 09/29/2015  . Cervical stenosis of spinal canal 01/08/2015  . Urinary tract infectious disease   . Muscle spasms of neck 06/15/2014  . Bleeding hemorrhoid 06/15/2014  . Anemia in chronic kidney disease 06/15/2014  . Pyelonephritis, acute 06/13/2014  . Sepsis ([place]) 06/13/2014  . History of kidney transplant   . Chronic pain syndrome 04/09/2012  .  Dehydration, mild 04/09/2012  . Gout attack 06/23/2011  . Herpes infection 06/23/2011  . Anxiety 06/23/2011  . E coli bacteremia 06/18/2011  . ESRD (end stage renal disease) ([place]) 06/12/2011  . UTI (lower urinary tract infection) 06/12/2011  . Bacteremia due to Gram-negative bacteria 05/23/2011  . History of renal transplantation 05/22/2011  . Septic shock(785.52) 05/22/2011  . Acute on chronic kidney failure ([place]) 05/22/2011  . Gastroparesis 04/24/2008  . WEIGHT LOSS 08/24/2007  . NAUSEA AND VOMITING 08/24/2007   PCP:  [name], FNP Pharmacy:   [id number] - [place], [place] [address] [place] [address] Phone: [phone] Fax: [phone]  [facility] [place] [place] [address] [place] [address] Phone: [phone] Fax: [phone]     Social Determinants of Health (SDOH) Interventions    Readmission Risk Interventions Readmission Risk Prevention Plan 05/18/2019  Transportation Screening Complete  PCP or Specialist Appt within 3-5 Days Complete  HRI or [place] (No Data)  Medication Review (RN Care Manager) Complete  Some recent data might be hidden

## 2019-07-12 NOTE — Progress Notes (Signed)
Patient ID: Tina Watkins, female   DOB: [birth year], 54 y.o.   MRN: [id number] [facility] Progress Note   Assessment/ Plan:   1. Acute kidney Injury on chronic kidney disease stage IV T-now likely end-stage renal disease: With progression following significant acute kidney injury after COVID-19 infection 2 months ago.  I appreciate assistance from interventional radiology with placement of a right femoral dialysis catheter in this patient with multifocal central vein stenosis/difficult access.  I have obtained vein mapping and will await opinion from vascular surgery to see if she has any options for permanent access placement (likely limited to thigh graft versus HeRO based on her history).  Will order for hemodialysis again today and then tomorrow; process started for outpatient dialysis unit placement. 2.  Status post kidney transplant: Usually on immunosuppressive therapy with Prograf, Imuran and prednisone with Imuran currently on hold due to neutropenia. 3.  Anemia of chronic kidney disease: With adequate iron stores and status post PRBC transfusion however, downtrending H&H.  Started on darbepoetin. 4.  Hypotension: Asymptomatic on midodrine. Monitor with HD/UF.  5.  Secondary hyperparathyroidism: Calcium level acceptable with elevated phosphorus noted, monitor with low-dose sevelamer. 6.  Acute gout flare: On higher dose prednisone 40 mg daily, will add colchicine 0.6 mg every 72 hours x 2 doses.  Subjective:   She complains of joint pain/swelling consistent with acute gout flare.    Objective:   BP (!) 78/54 (BP Location: Left Arm) Comment: RN and charge nurse notified  Pulse 78   Temp 99.2 F (37.3 C) (Oral)   Resp 16   Ht '4\' 11"'$  (1.499 m)   Wt 60.2 kg   SpO2 91%   BMI 26.81 kg/m   Intake/Output Summary (Last 24 hours) at 07/12/2019 1029 Last data filed at 07/11/2019 2300 Gross per 24 hour  Intake 50 ml  Output 1405 ml  Net -1355 ml   Weight change: 0  kg  Physical Exam: Gen: Appears to be uncomfortable resting in bed CVS: Pulse regular rhythm, normal rate, S1 and S2 normal Resp: Coarse breath sounds bilaterally with fine rales Abd: Soft, obese, nontender Ext: Trace lower extremity edema.  Right femoral TDC.  Imaging: IR Fluoro Guide CV Line Right  Result Date: 07/11/2019 CLINICAL DATA:  Failing renal transplant and need for hemodialysis. History of multiple prior tunneled dialysis catheter placements and vascular access interventions including prior SVC stenting with documented SVC stent occlusion in 2001, tunneled femoral dialysis catheter placement in 2951-8841 complicated by IVC stenoses and translumbar IVC dialysis catheter placement in 6606 complicated by IVC stenosis. Request now made to try to place a tunneled dialysis catheter for current hemodialysis needs. Peripheral IV access is also needed for IV conscious sedation during the procedure and IV antibiotic administration. EXAM: 1. PERIPHERAL IV ACCESS VIA RIGHT BASILIC VEIN UNDER ULTRASOUND GUIDANCE 2. RIGHT INTERNAL JUGULAR VEIN ACCESS UNDER ULTRASOUND GUIDANCE 3. TUNNELED CENTRAL VENOUS HEMODIALYSIS CATHETER PLACEMENT VIA RIGHT COMMON FEMORAL VEIN WITH ULTRASOUND AND FLUOROSCOPIC GUIDANCE ANESTHESIA/SEDATION: 1.0 mg IV Versed; 25 mcg IV Fentanyl. Total Moderate Sedation Time:   35 minutes. The patient's level of consciousness and physiologic status were continuously monitored during the procedure by Radiology nursing. MEDICATIONS: 2 g IV Ancef. FLUOROSCOPY TIME:  3 minutes and 24 seconds.  18 mGy. PROCEDURE: The procedure, risks, benefits, and alternatives were explained to the patient. Questions regarding the procedure were encouraged and answered. The patient understands and consents to the procedure. A timeout was performed prior to initiating the procedure.  Ultrasound was used to confirm patency of the right basilic vein in the upper arm. Skin of the right medial upper arm was prepped  with chlorhexidine. 1% lidocaine was infiltrated at the level of the skin and subcutaneous tissues. Under ultrasound guidance, a 21 gauge needle was advanced into the right basilic vein. A guidewire was advanced. A 5 French micropuncture dilator was then advanced into the vein. This was capped and secured for IV access during the procedure for IV antibiotic administration and IV conscious sedation. Preliminary ultrasound was performed of the right and left neck and right groin. Ultrasound was used to confirm patency of the right internal jugular vein and right common femoral vein. The right neck and right groin were prepped with chlorhexidine in a sterile fashion, and a sterile drape was applied covering the operative field. Maximum barrier sterile technique with sterile gowns and gloves were used for the procedure. Local anesthesia was provided with 1% lidocaine. A 21 gauge needle was advanced into the right internal jugular vein under direct, real-time ultrasound guidance. A guidewire was advanced. A transitional dilator was placed. Over a guidewire a 5 [place] catheter was then advanced. Attempt was made to advance the catheter into the SVC over a guidewire. Fluoroscopic spot images were obtained. Access of the right internal jugular vein was then abandoned and hemostasis obtained with manual compression. Under ultrasound guidance, a 21 gauge needle was advanced into the right common femoral vein. Ultrasound image documentation was performed. A venotomy incision was made in the right groin region. After securing guidewire access, a 5 French catheter was advanced over a guidewire. Guidewire advancement was performed into the inferior vena cava. A wire was kinked to measure appropriate tunneled dialysis catheter length. A Palindrome tunneled hemodialysis catheter measuring 28 cm from tip to cuff was chosen for placement. This was tunneled in a retrograde fashion from the right thigh to the venotomy incision. At the  venotomy, serial dilatation was performed and a 16 Fr peel-away sheath was placed over a guidewire. The catheter was then placed through the sheath and the sheath removed. Final catheter positioning was confirmed and documented with a fluoroscopic spot image. The catheter was aspirated, flushed with saline, and injected with appropriate volume heparin dwells. The venotomy incision was closed with subcuticular 4-0 Vicryl. Dermabond was applied to the incision. The catheter exit site was secured with a Prolene retention suture. COMPLICATIONS: None. FINDINGS: Initial ultrasound demonstrates a patent right internal jugular vein. There is no patent left internal jugular vein present with multiple small collateral veins present in the left neck. The right common femoral vein is normally patent in the right groin. Initial access of the right internal jugular vein was performed. A guidewire would not advance beyond the confluence of the brachiocephalic veins at the origin of the upper SVC. 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 Confirmation of venous occlusion was performed with a catheter and guidewire which could not be advanced below the level of chronic venous occlusion. The patient is not a candidate for future tunnel catheter placements in the chest due to chronic IVC occlusion. The right common femoral vein was accessed. A guidewire initially advanced through the common femoral and external iliac veins easily and met some resistance at the confluence of the common iliac veins and expected lower IVC. A 5 French catheter was able to be advanced  over a guidewire to about the level of the mid IVC but there was further resistance to guidewire and catheter passage at this level likely on the basis of chronic stenosis of the IVC. This is also near the location of  previous translumbar IVC access for catheter placement. After right femoral tunneled catheter placement, the tip lies in the lower IVC. The catheter aspirates well and is ready for immediate use. Flow rates may be somewhat limited given probable IVC stenosis above the level of the tip of the dialysis catheter. However, this is the best access possible currently for a tunneled catheter and other access sites will be quite limited given findings today and prior history. IMPRESSION: 1. Ultrasound-guided venous access of the right basilic vein in the upper arm performed for IV antibiotic administration and IV conscious sedation during dialysis catheter placement. 2. 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 The patient is not a candidate for future catheter placement in the chest given chronic SVC occlusion. 3. Patent right common femoral vein allowing placement of a tunneled dialysis catheter. This catheter was advanced to the level of the lower IVC and measures 28 cm from tip to cuff. A longer catheter was not able to be placed given catheter and guidewire evidence of significant mid IVC stenosis or occlusion based on restricted ability to advance a catheter and guidewire. Electronically Signed   By: Aletta Edouard M.D.   On: 07/11/2019 16:49   IR US Guide Vasc Access Right  Result Date: 07/11/2019 CLINICAL DATA:  Failing renal transplant and need for hemodialysis. History of multiple prior tunneled dialysis catheter placements and vascular access interventions including prior SVC stenting with documented SVC stent occlusion in 2001, tunneled femoral dialysis catheter placement in 9449-6759 complicated by IVC stenoses and translumbar IVC  dialysis catheter placement in 1638 complicated by IVC stenosis. Request now made to try to place a tunneled dialysis catheter for current hemodialysis needs. Peripheral IV access is also needed for IV conscious sedation during the procedure and IV antibiotic administration. EXAM: 1. PERIPHERAL IV ACCESS VIA RIGHT BASILIC VEIN UNDER ULTRASOUND GUIDANCE 2. RIGHT INTERNAL JUGULAR VEIN ACCESS UNDER ULTRASOUND GUIDANCE 3. TUNNELED CENTRAL VENOUS HEMODIALYSIS CATHETER PLACEMENT VIA RIGHT COMMON FEMORAL VEIN WITH ULTRASOUND AND FLUOROSCOPIC GUIDANCE ANESTHESIA/SEDATION: 1.0 mg IV Versed; 25 mcg IV Fentanyl. Total Moderate Sedation Time:   35 minutes. The patient's level of consciousness and physiologic status were continuously monitored during the procedure by Radiology nursing. MEDICATIONS: 2 g IV Ancef. FLUOROSCOPY TIME:  3 minutes and 24 seconds.  18 mGy. PROCEDURE: The procedure, risks, benefits, and alternatives were explained to the patient. Questions regarding the procedure were encouraged and answered. The patient understands and consents to the procedure. A timeout was performed prior to initiating the procedure. Ultrasound was used to confirm patency of the right basilic vein in the upper arm. Skin of the right medial upper arm was prepped with chlorhexidine. 1% lidocaine was infiltrated at the level of the skin and subcutaneous tissues. Under ultrasound guidance, a 21 gauge needle was advanced into the right basilic vein. A guidewire was advanced. A 5 French micropuncture dilator was then advanced into the vein. This was capped and secured for IV access during the procedure for IV antibiotic administration and IV conscious  sedation. Preliminary ultrasound was performed of the right and left neck and right groin. Ultrasound was used to confirm patency of the right internal jugular vein and right common femoral vein. The right neck and right groin were prepped with chlorhexidine in a sterile fashion, and a  sterile drape was applied covering the operative field. Maximum barrier sterile technique with sterile gowns and gloves were used for the procedure. Local anesthesia was provided with 1% lidocaine. A 21 gauge needle was advanced into the right internal jugular vein under direct, real-time ultrasound guidance. A guidewire was advanced. A transitional dilator was placed. Over a guidewire a 5 [place] catheter was then advanced. Attempt was made to advance the catheter into the SVC over a guidewire. Fluoroscopic spot images were obtained. Access of the right internal jugular vein was then abandoned and hemostasis obtained with manual compression. Under ultrasound guidance, a 21 gauge needle was advanced into the right common femoral vein. Ultrasound image documentation was performed. A venotomy incision was made in the right groin region. After securing guidewire access, a 5 French catheter was advanced over a guidewire. Guidewire advancement was performed into the inferior vena cava. A wire was kinked to measure appropriate tunneled dialysis catheter length. A Palindrome tunneled hemodialysis catheter measuring 28 cm from tip to cuff was chosen for placement. This was tunneled in a retrograde fashion from the right thigh to the venotomy incision. At the venotomy, serial dilatation was performed and a 16 Fr peel-away sheath was placed over a guidewire. The catheter was then placed through the sheath and the sheath removed. Final catheter positioning was confirmed and documented with a fluoroscopic spot image. The catheter was aspirated, flushed with saline, and injected with appropriate volume heparin dwells. The venotomy incision was closed with subcuticular 4-0 Vicryl. Dermabond was applied to the incision. The catheter exit site was secured with a Prolene retention suture. COMPLICATIONS: None. FINDINGS: Initial ultrasound demonstrates a patent right internal jugular vein. There is no patent left internal jugular vein  present with multiple small collateral veins present in the left neck. The right common femoral vein is normally patent in the right groin. Initial access of the right internal jugular vein was performed. A guidewire would not advance beyond the confluence of the brachiocephalic veins at the origin of the upper SVC. 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 Confirmation of venous occlusion was performed with a catheter and guidewire which could not be advanced below the level of chronic venous occlusion. The patient is not a candidate for future tunnel catheter placements in the chest due to chronic IVC occlusion. The right common femoral vein was accessed. A guidewire initially advanced through the common femoral and external iliac veins easily and met some resistance at the confluence of the common iliac veins and expected lower IVC. A 5 French catheter was able to be advanced over a guidewire to about the level of the mid IVC but there was further resistance to guidewire and catheter passage at this level likely on the basis of chronic stenosis of the IVC. This is also near the location of previous translumbar IVC access for catheter placement. After right femoral tunneled catheter placement, the tip lies in the lower IVC. The catheter aspirates well and is ready for immediate use. Flow rates may be somewhat limited given probable IVC stenosis above the level of the  tip of the dialysis catheter. However, this is the best access possible currently for a tunneled catheter and other access sites will be quite limited given findings today and prior history. IMPRESSION: 1. Ultrasound-guided venous access of the right basilic vein in the upper arm performed for IV antibiotic administration and IV conscious sedation during dialysis catheter placement. 2. Patent  right internal jugular vein was accessed and chronic occlusion of the entire SVC confirmed by fluoroscopic probing with a catheter and guidewire. 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 The patient is not a candidate for future catheter placement in the chest given chronic SVC occlusion. 3. Patent right common femoral vein allowing placement of a tunneled dialysis catheter. This catheter was advanced to the level of the lower IVC and measures 28 cm from tip to cuff. A longer catheter was not able to be placed given catheter and guidewire evidence of significant mid IVC stenosis or occlusion based on restricted ability to advance a catheter and guidewire. Electronically Signed   By: Aletta Edouard M.D.   On: 07/11/2019 16:49   IR US Guide Vasc Access Right  Result Date: 07/11/2019 CLINICAL DATA:  Failing renal transplant and need for hemodialysis. History of multiple prior tunneled dialysis catheter placements and vascular access interventions including prior SVC stenting with documented SVC stent occlusion in 2001, tunneled femoral dialysis catheter placement in 5974-1638 complicated by IVC stenoses and translumbar IVC dialysis catheter placement in 4536 complicated by IVC stenosis. Request now made to try to place a tunneled dialysis catheter for current hemodialysis needs. Peripheral IV access is also needed for IV conscious sedation during the procedure and IV antibiotic administration. EXAM: 1. PERIPHERAL IV ACCESS VIA RIGHT BASILIC VEIN UNDER ULTRASOUND GUIDANCE 2. RIGHT INTERNAL JUGULAR VEIN ACCESS UNDER ULTRASOUND GUIDANCE 3. TUNNELED CENTRAL VENOUS HEMODIALYSIS CATHETER PLACEMENT VIA RIGHT COMMON FEMORAL VEIN WITH ULTRASOUND AND FLUOROSCOPIC GUIDANCE ANESTHESIA/SEDATION: 1.0 mg IV Versed; 25 mcg IV Fentanyl. Total Moderate Sedation Time:   35 minutes. The patient's level of  consciousness and physiologic status were continuously monitored during the procedure by Radiology nursing. MEDICATIONS: 2 g IV Ancef. FLUOROSCOPY TIME:  3 minutes and 24 seconds.  18 mGy. PROCEDURE: The procedure, risks, benefits, and alternatives were explained to the patient. Questions regarding the procedure were encouraged and answered. The patient understands and consents to the procedure. A timeout was performed prior to initiating the procedure. Ultrasound was used to confirm patency of the right basilic vein in the upper arm. Skin of the right medial upper arm was prepped with chlorhexidine. 1% lidocaine was infiltrated at the level of the skin and subcutaneous tissues. Under ultrasound guidance, a 21 gauge needle was advanced into the right basilic vein. A guidewire was advanced. A 5 French micropuncture dilator was then advanced into the vein. This was capped and secured for IV access during the procedure for IV antibiotic administration and IV conscious sedation. Preliminary ultrasound was performed of the right and left neck and right groin. Ultrasound was used to confirm patency of the right internal jugular vein and right common femoral vein. The right neck and right groin were prepped with chlorhexidine in a sterile fashion, and a sterile drape was applied covering the operative field. Maximum barrier sterile technique with sterile gowns and gloves were used for the procedure. Local anesthesia was provided with 1% lidocaine. A 21 gauge needle was advanced into the right internal  jugular vein under direct, real-time ultrasound guidance. A guidewire was advanced. A transitional dilator was placed. Over a guidewire a 5 [place] catheter was then advanced. Attempt was made to advance the catheter into the SVC over a guidewire. Fluoroscopic spot images were obtained. Access of the right internal jugular vein was then abandoned and hemostasis obtained with manual compression. Under ultrasound guidance, a 21  gauge needle was advanced into the right common femoral vein. Ultrasound image documentation was performed. A venotomy incision was made in the right groin region. After securing guidewire access, a 5 French catheter was advanced over a guidewire. Guidewire advancement was performed into the inferior vena cava. A wire was kinked to measure appropriate tunneled dialysis catheter length. A Palindrome tunneled hemodialysis catheter measuring 28 cm from tip to cuff was chosen for placement. This was tunneled in a retrograde fashion from the right thigh to the venotomy incision. At the venotomy, serial dilatation was performed and a 16 Fr peel-away sheath was placed over a guidewire. The catheter was then placed through the sheath and the sheath removed. Final catheter positioning was confirmed and documented with a fluoroscopic spot image. The catheter was aspirated, flushed with saline, and injected with appropriate volume heparin dwells. The venotomy incision was closed with subcuticular 4-0 Vicryl. Dermabond was applied to the incision. The catheter exit site was secured with a Prolene retention suture. COMPLICATIONS: None. FINDINGS: Initial ultrasound demonstrates a patent right internal jugular vein. There is no patent left internal jugular vein present with multiple small collateral veins present in the left neck. The right common femoral vein is normally patent in the right groin. Initial access of the right internal jugular vein was performed. A guidewire would not advance beyond the confluence of the brachiocephalic veins at the origin of the upper SVC. 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 Confirmation of venous occlusion was performed with a catheter and guidewire which could not be advanced below the level of chronic venous occlusion. The patient  is not a candidate for future tunnel catheter placements in the chest due to chronic IVC occlusion. The right common femoral vein was accessed. A guidewire initially advanced through the common femoral and external iliac veins easily and met some resistance at the confluence of the common iliac veins and expected lower IVC. A 5 French catheter was able to be advanced over a guidewire to about the level of the mid IVC but there was further resistance to guidewire and catheter passage at this level likely on the basis of chronic stenosis of the IVC. This is also near the location of previous translumbar IVC access for catheter placement. After right femoral tunneled catheter placement, the tip lies in the lower IVC. The catheter aspirates well and is ready for immediate use. Flow rates may be somewhat limited given probable IVC stenosis above the level of the tip of the dialysis catheter. However, this is the best access possible currently for a tunneled catheter and other access sites will be quite limited given findings today and prior history. IMPRESSION: 1. Ultrasound-guided venous access of the right basilic vein in the upper arm performed for IV antibiotic administration and IV conscious sedation during dialysis catheter placement. 2. 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 The upper SVC above  the level of an indwelling stent is calcified, likely reflecting chronic calcified thrombus. 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 The patient is not a candidate for future catheter placement in the chest given chronic SVC occlusion. 3. Patent right common femoral vein allowing placement of a tunneled dialysis catheter. This catheter was advanced to the level of the lower IVC and measures 28 cm from tip to cuff. A longer catheter was not able to be placed given catheter and guidewire evidence  of significant mid IVC stenosis or occlusion based on restricted ability to advance a catheter and guidewire. Electronically Signed   By: Aletta Edouard M.D.   On: 07/11/2019 16:49   IR US Guide Vasc Access Right  Result Date: 07/11/2019 CLINICAL DATA:  Failing renal transplant and need for hemodialysis. History of multiple prior tunneled dialysis catheter placements and vascular access interventions including prior SVC stenting with documented SVC stent occlusion in 2001, tunneled femoral dialysis catheter placement in 1308-6578 complicated by IVC stenoses and translumbar IVC dialysis catheter placement in [id number] complicated by IVC stenosis. Request now made to try to place a tunneled dialysis catheter for current hemodialysis needs. Peripheral IV access is also needed for IV conscious sedation during the procedure and IV antibiotic administration. EXAM: 1. PERIPHERAL IV ACCESS VIA RIGHT BASILIC VEIN UNDER ULTRASOUND GUIDANCE 2. RIGHT INTERNAL JUGULAR VEIN ACCESS UNDER ULTRASOUND GUIDANCE 3. TUNNELED CENTRAL VENOUS HEMODIALYSIS CATHETER PLACEMENT VIA RIGHT COMMON FEMORAL VEIN WITH ULTRASOUND AND FLUOROSCOPIC GUIDANCE ANESTHESIA/SEDATION: 1.0 mg IV Versed; 25 mcg IV Fentanyl. Total Moderate Sedation Time:   35 minutes. The patient's level of consciousness and physiologic status were continuously monitored during the procedure by Radiology nursing. MEDICATIONS: 2 g IV Ancef. FLUOROSCOPY TIME:  3 minutes and 24 seconds.  18 mGy. PROCEDURE: The procedure, risks, benefits, and alternatives were explained to the patient. Questions regarding the procedure were encouraged and answered. The patient understands and consents to the procedure. A timeout was performed prior to initiating the procedure. Ultrasound was used to confirm patency of the right basilic vein in the upper arm. Skin of the right medial upper arm was prepped with chlorhexidine. 1% lidocaine was infiltrated at the level of the skin and subcutaneous  tissues. Under ultrasound guidance, a 21 gauge needle was advanced into the right basilic vein. A guidewire was advanced. A 5 French micropuncture dilator was then advanced into the vein. This was capped and secured for IV access during the procedure for IV antibiotic administration and IV conscious sedation. Preliminary ultrasound was performed of the right and left neck and right groin. Ultrasound was used to confirm patency of the right internal jugular vein and right common femoral vein. The right neck and right groin were prepped with chlorhexidine in a sterile fashion, and a sterile drape was applied covering the operative field. Maximum barrier sterile technique with sterile gowns and gloves were used for the procedure. Local anesthesia was provided with 1% lidocaine. A 21 gauge needle was advanced into the right internal jugular vein under direct, real-time ultrasound guidance. A guidewire was advanced. A transitional dilator was placed. Over a guidewire a 5 [place] catheter was then advanced. Attempt was made to advance the catheter into the SVC over a guidewire. Fluoroscopic spot images were obtained. Access of the right internal jugular vein was then abandoned and hemostasis obtained with manual compression. Under ultrasound guidance, a 21 gauge needle was advanced into the right common femoral vein. Ultrasound image documentation was performed. A venotomy incision was made in  the right groin region. After securing guidewire access, a 5 French catheter was advanced over a guidewire. Guidewire advancement was performed into the inferior vena cava. A wire was kinked to measure appropriate tunneled dialysis catheter length. A Palindrome tunneled hemodialysis catheter measuring 28 cm from tip to cuff was chosen for placement. This was tunneled in a retrograde fashion from the right thigh to the venotomy incision. At the venotomy, serial dilatation was performed and a 16 Fr peel-away sheath was placed over a  guidewire. The catheter was then placed through the sheath and the sheath removed. Final catheter positioning was confirmed and documented with a fluoroscopic spot image. The catheter was aspirated, flushed with saline, and injected with appropriate volume heparin dwells. The venotomy incision was closed with subcuticular 4-0 Vicryl. Dermabond was applied to the incision. The catheter exit site was secured with a Prolene retention suture. COMPLICATIONS: None. FINDINGS: Initial ultrasound demonstrates a patent right internal jugular vein. There is no patent left internal jugular vein present with multiple small collateral veins present in the left neck. The right common femoral vein is normally patent in the right groin. Initial access of the right internal jugular vein was performed. A guidewire would not advance beyond the confluence of the brachiocephalic veins at the origin of the upper SVC. 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 Confirmation of venous occlusion was performed with a catheter and guidewire which could not be advanced below the level of chronic venous occlusion. The patient is not a candidate for future tunnel catheter placements in the chest due to chronic IVC occlusion. The right common femoral vein was accessed. A guidewire initially advanced through the common femoral and external iliac veins easily and met some resistance at the confluence of the common iliac veins and expected lower IVC. A 5 French catheter was able to be advanced over a guidewire to about the level of the mid IVC but there was further resistance to guidewire and catheter passage at this level likely on the basis of chronic stenosis of the IVC. This is also near the location of previous translumbar IVC access for catheter placement. After right femoral tunneled  catheter placement, the tip lies in the lower IVC. The catheter aspirates well and is ready for immediate use. Flow rates may be somewhat limited given probable IVC stenosis above the level of the tip of the dialysis catheter. However, this is the best access possible currently for a tunneled catheter and other access sites will be quite limited given findings today and prior history. IMPRESSION: 1. Ultrasound-guided venous access of the right basilic vein in the upper arm performed for IV antibiotic administration and IV conscious sedation during dialysis catheter placement. 2. 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 The patient is not a candidate for future catheter placement in the chest given chronic SVC occlusion. 3. Patent right common femoral vein allowing placement of a tunneled dialysis catheter. This catheter was advanced to the level of the lower IVC and measures 28 cm from tip to cuff. A longer catheter was not able to be placed given catheter and guidewire evidence of significant  mid IVC stenosis or occlusion based on restricted ability to advance a catheter and guidewire. Electronically Signed   By: Aletta Edouard M.D.   On: 07/11/2019 16:49   IR RADIOLOGY PERIPHERAL GUIDED IV START  Result Date: 07/11/2019 CLINICAL DATA:  Failing renal transplant and need for hemodialysis. History of multiple prior tunneled dialysis catheter placements and vascular access interventions including prior SVC stenting with documented SVC stent occlusion in 2001, tunneled femoral dialysis catheter placement in [id number] complicated by IVC stenoses and translumbar IVC dialysis catheter placement in 9924 complicated by IVC stenosis. Request now  made to try to place a tunneled dialysis catheter for current hemodialysis needs. Peripheral IV access is also needed for IV conscious sedation during the procedure and IV antibiotic administration. EXAM: 1. PERIPHERAL IV ACCESS VIA RIGHT BASILIC VEIN UNDER ULTRASOUND GUIDANCE 2. RIGHT INTERNAL JUGULAR VEIN ACCESS UNDER ULTRASOUND GUIDANCE 3. TUNNELED CENTRAL VENOUS HEMODIALYSIS CATHETER PLACEMENT VIA RIGHT COMMON FEMORAL VEIN WITH ULTRASOUND AND FLUOROSCOPIC GUIDANCE ANESTHESIA/SEDATION: 1.0 mg IV Versed; 25 mcg IV Fentanyl. Total Moderate Sedation Time:   35 minutes. The patient's level of consciousness and physiologic status were continuously monitored during the procedure by Radiology nursing. MEDICATIONS: 2 g IV Ancef. FLUOROSCOPY TIME:  3 minutes and 24 seconds.  18 mGy. PROCEDURE: The procedure, risks, benefits, and alternatives were explained to the patient. Questions regarding the procedure were encouraged and answered. The patient understands and consents to the procedure. A timeout was performed prior to initiating the procedure. Ultrasound was used to confirm patency of the right basilic vein in the upper arm. Skin of the right medial upper arm was prepped with chlorhexidine. 1% lidocaine was infiltrated at the level of the skin and subcutaneous tissues. Under ultrasound guidance, a 21 gauge needle was advanced into the right basilic vein. A guidewire was advanced. A 5 French micropuncture dilator was then advanced into the vein. This was capped and secured for IV access during the procedure for IV antibiotic administration and IV conscious sedation. Preliminary ultrasound was performed of the right and left neck and right groin. Ultrasound was used to confirm patency of the right internal jugular vein and right common femoral vein. The right neck and right groin were prepped with chlorhexidine in a sterile fashion, and a sterile drape was applied covering the operative field. Maximum barrier sterile  technique with sterile gowns and gloves were used for the procedure. Local anesthesia was provided with 1% lidocaine. A 21 gauge needle was advanced into the right internal jugular vein under direct, real-time ultrasound guidance. A guidewire was advanced. A transitional dilator was placed. Over a guidewire a 5 [place] catheter was then advanced. Attempt was made to advance the catheter into the SVC over a guidewire. Fluoroscopic spot images were obtained. Access of the right internal jugular vein was then abandoned and hemostasis obtained with manual compression. Under ultrasound guidance, a 21 gauge needle was advanced into the right common femoral vein. Ultrasound image documentation was performed. A venotomy incision was made in the right groin region. After securing guidewire access, a 5 French catheter was advanced over a guidewire. Guidewire advancement was performed into the inferior vena cava. A wire was kinked to measure appropriate tunneled dialysis catheter length. A Palindrome tunneled hemodialysis catheter measuring 28 cm from tip to cuff was chosen for placement. This was tunneled in a retrograde fashion from the right thigh to the venotomy incision. At the venotomy, serial dilatation was performed and a 16 Fr peel-away sheath was placed over a guidewire.  The catheter was then placed through the sheath and the sheath removed. Final catheter positioning was confirmed and documented with a fluoroscopic spot image. The catheter was aspirated, flushed with saline, and injected with appropriate volume heparin dwells. The venotomy incision was closed with subcuticular 4-0 Vicryl. Dermabond was applied to the incision. The catheter exit site was secured with a Prolene retention suture. COMPLICATIONS: None. FINDINGS: Initial ultrasound demonstrates a patent right internal jugular vein. There is no patent left internal jugular vein present with multiple small collateral veins present in the left neck. The right  common femoral vein is normally patent in the right groin. Initial access of the right internal jugular vein was performed. A guidewire would not advance beyond the confluence of the brachiocephalic veins at the origin of the upper SVC. 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 Confirmation of venous occlusion was performed with a catheter and guidewire which could not be advanced below the level of chronic venous occlusion. The patient is not a candidate for future tunnel catheter placements in the chest due to chronic IVC occlusion. The right common femoral vein was accessed. A guidewire initially advanced through the common femoral and external iliac veins easily and met some resistance at the confluence of the common iliac veins and expected lower IVC. A 5 French catheter was able to be advanced over a guidewire to about the level of the mid IVC but there was further resistance to guidewire and catheter passage at this level likely on the basis of chronic stenosis of the IVC. This is also near the location of previous translumbar IVC access for catheter placement. After right femoral tunneled catheter placement, the tip lies in the lower IVC. The catheter aspirates well and is ready for immediate use. Flow rates may be somewhat limited given probable IVC stenosis above the level of the tip of the dialysis catheter. However, this is the best access possible currently for a tunneled catheter and other access sites will be quite limited given findings today and prior history. IMPRESSION: 1. Ultrasound-guided venous access of the right basilic vein in the upper arm performed for IV antibiotic administration and IV conscious sedation during dialysis catheter placement. 2. Patent right internal jugular vein was accessed and chronic occlusion of the entire SVC  confirmed by fluoroscopic probing with a catheter and guidewire. 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 The patient is not a candidate for future catheter placement in the chest given chronic SVC occlusion. 3. Patent right common femoral vein allowing placement of a tunneled dialysis catheter. This catheter was advanced to the level of the lower IVC and measures 28 cm from tip to cuff. A longer catheter was not able to be placed given catheter and guidewire evidence of significant mid IVC stenosis or occlusion based on restricted ability to advance a catheter and guidewire. Electronically Signed   By: Aletta Edouard M.D.   On: 07/11/2019 16:49    Labs: BMET Recent Labs  Lab 07/07/19 1033 07/08/19 0350 07/09/19 0312 07/10/19 0415 07/11/19 0422 07/12/19 0326  NA 137 137 139 137 137 139  K 4.6 5.0 5.3* 4.7 5.0 4.4  CL 105 106 105 106 101 104  CO2 20* 20* 19* 19* 22 22  GLUCOSE 84 82  80 80 88 87  BUN 80* 82* 84* 85* 87* 50*  CREATININE 5.82* 5.61* 5.33* 5.56* 5.86* 4.40*  CALCIUM 9.1 9.1 9.3 9.1 9.4 8.9  PHOS  --   --  6.0* 5.9*  --   --    CBC Recent Labs  Lab 07/09/19 1037 07/10/19 0415 07/11/19 0422 07/12/19 0326  WBC 1.5* 1.3* 1.7* 1.8*  NEUTROABS 1.0* 1.0* 1.1* 1.0*  HGB 8.1* 7.9* 7.7* 8.3*  HCT 25.5* 24.2* 24.0* 25.6*  MCV 103.7* 102.1* 103.4* 103.2*  PLT 125* 115* 133* 134*    Medications:    . aspirin EC  81 mg Oral Daily  . colchicine  0.6 mg Oral BID  . darbepoetin (ARANESP) injection - NON-DIALYSIS  100 mcg Subcutaneous Q Mon-1800  . famotidine  20 mg Oral Daily  . gabapentin  100 mg Oral BID  . heparin  5,000 Units Subcutaneous Q8H  . latanoprost  1 drop Left Eye QHS  . midodrine  10 mg Oral TID WC  . pneumococcal 23 valent vaccine  0.5 mL Intramuscular Tomorrow-1000  . predniSONE  40 mg Oral Daily  . sevelamer carbonate  800 mg  Oral TID WC  . tacrolimus  2 mg Oral QHS  . tacrolimus  3 mg Oral q morning - 10a  . Vitamin D (Ergocalciferol)  50,000 Units Oral Q Glennie Hawk, MD 07/12/2019, 10:29 AM

## 2019-07-12 NOTE — Progress Notes (Signed)
   07/12/19 0836  Vitals  Temp 99.2 F (37.3 C)  Temp Source Oral  BP (!) 78/54 (RN and charge nurse notified)  MAP (mmHg) (!) 61  BP Location Left Arm  BP Method Automatic  Patient Position (if appropriate) Lying  Pulse Rate 78  Pulse Rate Source Dinamap  Resp 16  Oxygen Therapy  SpO2 91 %  O2 Device Room Air  MEWS Score  MEWS Temp 0  MEWS Systolic 2  MEWS Pulse 0  MEWS RR 0  MEWS LOC 0  MEWS Score 2  MEWS Score Color Yellow  Provider Notification  Provider Name/Title Dr Broadus John  Date Provider Notified 07/12/19  Time Provider Notified [phone]  Notification Type Rounds  Notification Reason Other (Comment) (yellow MEWS due to low BP)  Response See new orders  Date of Provider Response 07/12/19  Time of Provider Response 0855  Rapid Response Notification  Name of Rapid Response RN Notified n/a

## 2019-07-12 NOTE — Progress Notes (Signed)
Pt transferred from dialysis unit straight to 2C-14.

## 2019-07-12 NOTE — Procedures (Signed)
Patient seen on Hemodialysis. BP (!) 107/53   Pulse 66   Temp 99.5 F (37.5 C) (Oral)   Resp 16   Ht 4\' 11"  (1.499 m)   Wt 60.8 kg   SpO2 93%   BMI 27.07 kg/m   QB 300, UF goal 1.5L Tolerating treatment without complaints at this time.   Elmarie Shiley MD [facility] Office # [phone] Pager # [phone] 1:54 PM

## 2019-07-12 NOTE — Plan of Care (Signed)

## 2019-07-12 NOTE — Progress Notes (Signed)
Pt report given to [facility] receiving RN

## 2019-07-13 LAB — CBC WITH DIFFERENTIAL/PLATELET
Abs Immature Granulocytes: 0.03 10*3/uL (ref 0.00–0.07)
Basophils Absolute: 0 10*3/uL (ref 0.0–0.1)
Basophils Relative: 0 %
Eosinophils Absolute: 0 10*3/uL (ref 0.0–0.5)
Eosinophils Relative: 0 %
HCT: 28.1 % — ABNORMAL LOW (ref 36.0–46.0)
Hemoglobin: 9 g/dL — ABNORMAL LOW (ref 12.0–15.0)
Immature Granulocytes: 1 %
Lymphocytes Relative: 15 %
Lymphs Abs: 0.4 10*3/uL — ABNORMAL LOW (ref 0.7–4.0)
MCH: 33.3 pg (ref 26.0–34.0)
MCHC: 32 g/dL (ref 30.0–36.0)
MCV: 104.1 fL — ABNORMAL HIGH (ref 80.0–100.0)
Monocytes Absolute: 0.2 10*3/uL (ref 0.1–1.0)
Monocytes Relative: 8 %
Neutro Abs: 1.7 10*3/uL (ref 1.7–7.7)
Neutrophils Relative %: 76 %
Platelets: 152 10*3/uL (ref 150–400)
RBC: 2.7 MIL/uL — ABNORMAL LOW (ref 3.87–5.11)
RDW: 24.6 % — ABNORMAL HIGH (ref 11.5–15.5)
WBC: 2.3 10*3/uL — ABNORMAL LOW (ref 4.0–10.5)
nRBC: 3.9 % — ABNORMAL HIGH (ref 0.0–0.2)

## 2019-07-13 MED ORDER — HEPARIN SODIUM (PORCINE) 1000 UNIT/ML IJ SOLN
INTRAMUSCULAR | Status: AC
  Filled 2019-07-13: qty 5

## 2019-07-13 MED ORDER — PREDNISONE 20 MG PO TABS
30.0000 mg | ORAL_TABLET | Freq: Every day | ORAL | Status: DC
  Administered 2019-07-14: 30 mg via ORAL
  Filled 2019-07-13: qty 1

## 2019-07-13 MED ORDER — MIDODRINE HCL 5 MG PO TABS
15.0000 mg | ORAL_TABLET | Freq: Three times a day (TID) | ORAL | Status: DC
  Administered 2019-07-13 – 2019-07-15 (×7): 15 mg via ORAL
  Filled 2019-07-13 (×6): qty 3

## 2019-07-13 NOTE — Progress Notes (Signed)
[facility] PROGRESS NOTE    Progress Note  Tina Watkins  GNO:[id number] DOB: July 18, 1965 DOA: 07/07/2019 PCP: [name], FNP   Brief Narrative:   Tina Watkins is an 54 y.o. female end-stage renal disease status post renal transplant in 2005, chronic kidney disease stage IV on chronic steroids and immunosuppressive therapy, chronic anemia in the setting of chronic renal disease and immunosuppression from chemotherapy with intermittent blood transfusions and regular EPO injections by hematology, TIA, CHF, moderate pulmonary hypertension presented with increased fatigue and low hemoglobin.  She was also noted to be in acute kidney injury. -Noted to have progressive renal failure with fluid overload and borderline uremic symptoms, seen by nephrology, started on HD  Assessment/Plan:   Nonoliguric Acute kidney injury on chronic kidney disease stage IV: -Admitted fluid overloaded -Nephrology following, poor response to high-dose diuretics, also had mild uremic symptoms  -Underwent tunneled dialysis catheter placement on 6/8, started hemodialysis  -VVS consulted for permanent HD access -Completed 2 HD sessions, vascular planning elective outpatient -Clip for outpatient HD  Acute gout flare -Involving right ankle and big toe, improving, continue higher dose prednisone for few days and colchicine  Anemia of chronic disease -From CKD 4, worsened by bone marrow suppression from transplant meds -Iron stores are okay, given 1 unit of PRBC -Monitor hemoglobin, starting EPO  Status post renal transplant in 2005 Steroids tacrolimus, Imuran held due to cytopenias. Further management per nephrology.  Chronic hypotension Asymptomatic blood pressure is on the low side. -Continue midodrine, dose increased  Pancytopenia/neutropenia Multifactorial in the setting of Imuran hematology was consulted and recommended checking B12, copper, HIV which are negative -Imuran  held  chronic diastolic heart failure: -Volume managed with HD now -Last echo 01/2019 with restrictive cardiomyopathy, grade 3 diastolic dysfunction, preserved EF, moderate pulmonary hypertension   DVT prophylaxis: Lovenox Family Communication:none Status is: Inpatient  Remains inpatient appropriate because:Hemodynamically unstable, newly started on HD   Dispo: The patient is from: Home              Anticipated d/c is to: Home              Anticipated d/c date is: 2 to 3 days              Patient currently is not medically stable to d/c.   Code Status:     Code Status Orders  (From admission, onward)         Start     Ordered   07/07/19 1615  Full code  Continuous     07/07/19 1616        Code Status History    Date Active Date Inactive Code Status Order ID Comments User Context   05/09/2019 0706 05/18/2019 2040 Full Code [id number]  Shela Leff, MD ED   03/20/2019 1507 03/24/2019 2243 Full Code [id number]  Lequita Halt, MD Inpatient   11/20/2017 0236 11/20/2017 1916 Full Code [id number]  Ivor Costa, MD ED   08/09/2017 2312 08/17/2017 1950 Full Code [id number]  Kathrene Alu, MD Inpatient   09/29/2015 2318 10/03/2015 1841 Full Code [id number]  Reubin Milan, MD Inpatient   09/29/2015 2318 09/29/2015 2318 Full Code [id number]  Reubin Milan, MD Inpatient   01/08/2015 1637 01/10/2015 1918 Full Code [id number]  Leeroy Cha, MD Inpatient   06/13/2014 1008 06/16/2014 1800 Full Code [id number]  Orson Eva, MD Inpatient   04/10/2012 0109 04/11/2012 2008 Full Code [id number]  Derrill Kay, MD Inpatient   12/10/2011  2204 12/12/2011 1423 Full Code [id number]  Neomia Glass, RN Inpatient   05/22/2011 1520 05/30/2011 1553 Full Code [id number]  Minor, Grace Bushy, NP ED   Advance Care Planning Activity        IV Access:    Peripheral IV   Procedures and diagnostic studies:   IR Fluoro Guide CV Line Right  Result Date: 07/11/2019 CLINICAL DATA:  Failing renal transplant  and need for hemodialysis. History of multiple prior tunneled dialysis catheter placements and vascular access interventions including prior SVC stenting with documented SVC stent occlusion in 2001, tunneled femoral dialysis catheter placement in 1497-0263 complicated by IVC stenoses and translumbar IVC dialysis catheter placement in 7858 complicated by IVC stenosis. Request now made to try to place a tunneled dialysis catheter for current hemodialysis needs. Peripheral IV access is also needed for IV conscious sedation during the procedure and IV antibiotic administration. EXAM: 1. PERIPHERAL IV ACCESS VIA RIGHT BASILIC VEIN UNDER ULTRASOUND GUIDANCE 2. RIGHT INTERNAL JUGULAR VEIN ACCESS UNDER ULTRASOUND GUIDANCE 3. TUNNELED CENTRAL VENOUS HEMODIALYSIS CATHETER PLACEMENT VIA RIGHT COMMON FEMORAL VEIN WITH ULTRASOUND AND FLUOROSCOPIC GUIDANCE ANESTHESIA/SEDATION: 1.0 mg IV Versed; 25 mcg IV Fentanyl. Total Moderate Sedation Time:   35 minutes. The patient's level of consciousness and physiologic status were continuously monitored during the procedure by Radiology nursing. MEDICATIONS: 2 g IV Ancef. FLUOROSCOPY TIME:  3 minutes and 24 seconds.  18 mGy. PROCEDURE: The procedure, risks, benefits, and alternatives were explained to the patient. Questions regarding the procedure were encouraged and answered. The patient understands and consents to the procedure. A timeout was performed prior to initiating the procedure. Ultrasound was used to confirm patency of the right basilic vein in the upper arm. Skin of the right medial upper arm was prepped with chlorhexidine. 1% lidocaine was infiltrated at the level of the skin and subcutaneous tissues. Under ultrasound guidance, a 21 gauge needle was advanced into the right basilic vein. A guidewire was advanced. A 5 French micropuncture dilator was then advanced into the vein. This was capped and secured for IV access during the procedure for IV antibiotic administration and  IV conscious sedation. Preliminary ultrasound was performed of the right and left neck and right groin. Ultrasound was used to confirm patency of the right internal jugular vein and right common femoral vein. The right neck and right groin were prepped with chlorhexidine in a sterile fashion, and a sterile drape was applied covering the operative field. Maximum barrier sterile technique with sterile gowns and gloves were used for the procedure. Local anesthesia was provided with 1% lidocaine. A 21 gauge needle was advanced into the right internal jugular vein under direct, real-time ultrasound guidance. A guidewire was advanced. A transitional dilator was placed. Over a guidewire a 5 [place] catheter was then advanced. Attempt was made to advance the catheter into the SVC over a guidewire. Fluoroscopic spot images were obtained. Access of the right internal jugular vein was then abandoned and hemostasis obtained with manual compression. Under ultrasound guidance, a 21 gauge needle was advanced into the right common femoral vein. Ultrasound image documentation was performed. A venotomy incision was made in the right groin region. After securing guidewire access, a 5 French catheter was advanced over a guidewire. Guidewire advancement was performed into the inferior vena cava. A wire was kinked to measure appropriate tunneled dialysis catheter length. A Palindrome tunneled hemodialysis catheter measuring 28 cm from tip to cuff was chosen for placement. This was tunneled in a retrograde fashion from the  right thigh to the venotomy incision. At the venotomy, serial dilatation was performed and a 16 Fr peel-away sheath was placed over a guidewire. The catheter was then placed through the sheath and the sheath removed. Final catheter positioning was confirmed and documented with a fluoroscopic spot image. The catheter was aspirated, flushed with saline, and injected with appropriate volume heparin dwells. The venotomy  incision was closed with subcuticular 4-0 Vicryl. Dermabond was applied to the incision. The catheter exit site was secured with a Prolene retention suture. COMPLICATIONS: None. FINDINGS: Initial ultrasound demonstrates a patent right internal jugular vein. There is no patent left internal jugular vein present with multiple small collateral veins present in the left neck. The right common femoral vein is normally patent in the right groin. Initial access of the right internal jugular vein was performed. A guidewire would not advance beyond the confluence of the brachiocephalic veins at the origin of the upper SVC. 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 Confirmation of venous occlusion was performed with a catheter and guidewire which could not be advanced below the level of chronic venous occlusion. The patient is not a candidate for future tunnel catheter placements in the chest due to chronic IVC occlusion. The right common femoral vein was accessed. A guidewire initially advanced through the common femoral and external iliac veins easily and met some resistance at the confluence of the common iliac veins and expected lower IVC. A 5 French catheter was able to be advanced over a guidewire to about the level of the mid IVC but there was further resistance to guidewire and catheter passage at this level likely on the basis of chronic stenosis of the IVC. This is also near the location of previous translumbar IVC access for catheter placement. After right femoral tunneled catheter placement, the tip lies in the lower IVC. The catheter aspirates well and is ready for immediate use. Flow rates may be somewhat limited given probable IVC stenosis above the level of the tip of the dialysis catheter. However, this is the best access possible currently for a  tunneled catheter and other access sites will be quite limited given findings today and prior history. IMPRESSION: 1. Ultrasound-guided venous access of the right basilic vein in the upper arm performed for IV antibiotic administration and IV conscious sedation during dialysis catheter placement. 2. 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 The patient is not a candidate for future catheter placement in the chest given chronic SVC occlusion. 3. Patent right common femoral vein allowing placement of a tunneled dialysis catheter. This catheter was advanced to the level of the lower IVC and measures 28 cm from tip to cuff. A longer catheter was not able to be placed given catheter and guidewire evidence of significant mid IVC stenosis or occlusion based on restricted ability to advance a catheter and guidewire. Electronically Signed   By: Aletta Edouard M.D.   On: 07/11/2019 16:49   IR US Guide Vasc Access Right  Result Date: 07/11/2019 CLINICAL DATA:  Failing renal transplant and need for hemodialysis. History of multiple prior tunneled dialysis catheter placements and vascular access  interventions including prior SVC stenting with documented SVC stent occlusion in 2001, tunneled femoral dialysis catheter placement in 8119-1478 complicated by IVC stenoses and translumbar IVC dialysis catheter placement in 2956 complicated by IVC stenosis. Request now made to try to place a tunneled dialysis catheter for current hemodialysis needs. Peripheral IV access is also needed for IV conscious sedation during the procedure and IV antibiotic administration. EXAM: 1. PERIPHERAL IV ACCESS VIA RIGHT BASILIC VEIN UNDER ULTRASOUND GUIDANCE 2. RIGHT  INTERNAL JUGULAR VEIN ACCESS UNDER ULTRASOUND GUIDANCE 3. TUNNELED CENTRAL VENOUS HEMODIALYSIS CATHETER PLACEMENT VIA RIGHT COMMON FEMORAL VEIN WITH ULTRASOUND AND FLUOROSCOPIC GUIDANCE ANESTHESIA/SEDATION: 1.0 mg IV Versed; 25 mcg IV Fentanyl. Total Moderate Sedation Time:   35 minutes. The patient's level of consciousness and physiologic status were continuously monitored during the procedure by Radiology nursing. MEDICATIONS: 2 g IV Ancef. FLUOROSCOPY TIME:  3 minutes and 24 seconds.  18 mGy. PROCEDURE: The procedure, risks, benefits, and alternatives were explained to the patient. Questions regarding the procedure were encouraged and answered. The patient understands and consents to the procedure. A timeout was performed prior to initiating the procedure. Ultrasound was used to confirm patency of the right basilic vein in the upper arm. Skin of the right medial upper arm was prepped with chlorhexidine. 1% lidocaine was infiltrated at the level of the skin and subcutaneous tissues. Under ultrasound guidance, a 21 gauge needle was advanced into the right basilic vein. A guidewire was advanced. A 5 French micropuncture dilator was then advanced into the vein. This was capped and secured for IV access during the procedure for IV antibiotic administration and IV conscious sedation. Preliminary ultrasound was performed of the right and left neck and right groin. Ultrasound was used to confirm patency of the right internal jugular vein and right common femoral vein. The right neck and right groin were prepped with chlorhexidine in a sterile fashion, and a sterile drape was applied covering the operative field. Maximum barrier sterile technique with sterile gowns and gloves were used for the procedure. Local anesthesia was provided with 1% lidocaine. A 21 gauge needle was advanced into the right internal jugular vein under direct, real-time ultrasound guidance. A guidewire was advanced. A transitional dilator was  placed. Over a guidewire a 5 [place] catheter was then advanced. Attempt was made to advance the catheter into the SVC over a guidewire. Fluoroscopic spot images were obtained. Access of the right internal jugular vein was then abandoned and hemostasis obtained with manual compression. Under ultrasound guidance, a 21 gauge needle was advanced into the right common femoral vein. Ultrasound image documentation was performed. A venotomy incision was made in the right groin region. After securing guidewire access, a 5 French catheter was advanced over a guidewire. Guidewire advancement was performed into the inferior vena cava. A wire was kinked to measure appropriate tunneled dialysis catheter length. A Palindrome tunneled hemodialysis catheter measuring 28 cm from tip to cuff was chosen for placement. This was tunneled in a retrograde fashion from the right thigh to the venotomy incision. At the venotomy, serial dilatation was performed and a 16 Fr peel-away sheath was placed over a guidewire. The catheter was then placed through the sheath and the sheath removed. Final catheter positioning was confirmed and documented with a fluoroscopic spot image. The catheter was aspirated, flushed with saline, and injected with appropriate volume heparin dwells. The venotomy incision was closed with subcuticular 4-0 Vicryl. Dermabond was applied to the incision. The catheter exit site was secured with a Prolene  retention suture. COMPLICATIONS: None. FINDINGS: Initial ultrasound demonstrates a patent right internal jugular vein. There is no patent left internal jugular vein present with multiple small collateral veins present in the left neck. The right common femoral vein is normally patent in the right groin. Initial access of the right internal jugular vein was performed. A guidewire would not advance beyond the confluence of the brachiocephalic veins at the origin of the upper SVC. Under fluoroscopy, there is clearly a large  amount of calcification within the upper SVC likely reflecting calcified chronic thrombus which is present above the level of an indwelling lower IVC stent. Confirmation of venous occlusion was performed with a catheter and guidewire which could not be advanced below the level of chronic venous occlusion. The patient is not a candidate for future tunnel catheter placements in the chest due to chronic IVC occlusion. The right common femoral vein was accessed. A guidewire initially advanced through the common femoral and external iliac veins easily and met some resistance at the confluence of the common iliac veins and expected lower IVC. A 5 French catheter was able to be advanced over a guidewire to about the level of the mid IVC but there was further resistance to guidewire and catheter passage at this level likely on the basis of chronic stenosis of the IVC. This is also near the location of previous translumbar IVC access for catheter placement. After right femoral tunneled catheter placement, the tip lies in the lower IVC. The catheter aspirates well and is ready for immediate use. Flow rates may be somewhat limited given probable IVC stenosis above the level of the tip of the dialysis catheter. However, this is the best access possible currently for a tunneled catheter and other access sites will be quite limited given findings today and prior history. IMPRESSION: 1. Ultrasound-guided venous access of the right basilic vein in the upper arm performed for IV antibiotic administration and IV conscious sedation during dialysis catheter placement. 2. 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 The patient is not a candidate for future  catheter placement in the chest given chronic SVC occlusion. 3. Patent right common femoral vein allowing placement of a tunneled dialysis catheter. This catheter was advanced to the level of the lower IVC and measures 28 cm from tip to cuff. A longer catheter was not able to be placed given catheter and guidewire evidence of significant mid IVC stenosis or occlusion based on restricted ability to advance a catheter and guidewire. Electronically Signed   By: Aletta Edouard M.D.   On: 07/11/2019 16:49   IR US Guide Vasc Access Right  Result Date: 07/11/2019 CLINICAL DATA:  Failing renal transplant and need for hemodialysis. History of multiple prior tunneled dialysis catheter placements and vascular access interventions including prior SVC stenting with documented SVC stent occlusion in 2001, tunneled femoral dialysis catheter placement in 8828-0034 complicated by IVC stenoses and translumbar IVC dialysis catheter placement in 9179 complicated by IVC stenosis. Request now made to try to place a tunneled dialysis catheter for current hemodialysis needs. Peripheral IV access is also needed for IV conscious sedation during the procedure and IV antibiotic administration. EXAM: 1. PERIPHERAL IV ACCESS VIA RIGHT BASILIC VEIN UNDER ULTRASOUND GUIDANCE 2. RIGHT INTERNAL JUGULAR VEIN ACCESS UNDER  ULTRASOUND GUIDANCE 3. TUNNELED CENTRAL VENOUS HEMODIALYSIS CATHETER PLACEMENT VIA RIGHT COMMON FEMORAL VEIN WITH ULTRASOUND AND FLUOROSCOPIC GUIDANCE ANESTHESIA/SEDATION: 1.0 mg IV Versed; 25 mcg IV Fentanyl. Total Moderate Sedation Time:   35 minutes. The patient's level of consciousness and physiologic status were continuously monitored during the procedure by Radiology nursing. MEDICATIONS: 2 g IV Ancef. FLUOROSCOPY TIME:  3 minutes and 24 seconds.  18 mGy. PROCEDURE: The procedure, risks, benefits, and alternatives were explained to the patient. Questions regarding the procedure were encouraged and answered. The patient  understands and consents to the procedure. A timeout was performed prior to initiating the procedure. Ultrasound was used to confirm patency of the right basilic vein in the upper arm. Skin of the right medial upper arm was prepped with chlorhexidine. 1% lidocaine was infiltrated at the level of the skin and subcutaneous tissues. Under ultrasound guidance, a 21 gauge needle was advanced into the right basilic vein. A guidewire was advanced. A 5 French micropuncture dilator was then advanced into the vein. This was capped and secured for IV access during the procedure for IV antibiotic administration and IV conscious sedation. Preliminary ultrasound was performed of the right and left neck and right groin. Ultrasound was used to confirm patency of the right internal jugular vein and right common femoral vein. The right neck and right groin were prepped with chlorhexidine in a sterile fashion, and a sterile drape was applied covering the operative field. Maximum barrier sterile technique with sterile gowns and gloves were used for the procedure. Local anesthesia was provided with 1% lidocaine. A 21 gauge needle was advanced into the right internal jugular vein under direct, real-time ultrasound guidance. A guidewire was advanced. A transitional dilator was placed. Over a guidewire a 5 [place] catheter was then advanced. Attempt was made to advance the catheter into the SVC over a guidewire. Fluoroscopic spot images were obtained. Access of the right internal jugular vein was then abandoned and hemostasis obtained with manual compression. Under ultrasound guidance, a 21 gauge needle was advanced into the right common femoral vein. Ultrasound image documentation was performed. A venotomy incision was made in the right groin region. After securing guidewire access, a 5 French catheter was advanced over a guidewire. Guidewire advancement was performed into the inferior vena cava. A wire was kinked to measure appropriate  tunneled dialysis catheter length. A Palindrome tunneled hemodialysis catheter measuring 28 cm from tip to cuff was chosen for placement. This was tunneled in a retrograde fashion from the right thigh to the venotomy incision. At the venotomy, serial dilatation was performed and a 16 Fr peel-away sheath was placed over a guidewire. The catheter was then placed through the sheath and the sheath removed. Final catheter positioning was confirmed and documented with a fluoroscopic spot image. The catheter was aspirated, flushed with saline, and injected with appropriate volume heparin dwells. The venotomy incision was closed with subcuticular 4-0 Vicryl. Dermabond was applied to the incision. The catheter exit site was secured with a Prolene retention suture. COMPLICATIONS: None. FINDINGS: Initial ultrasound demonstrates a patent right internal jugular vein. There is no patent left internal jugular vein present with multiple small collateral veins present in the left neck. The right common femoral vein is normally patent in the right groin. Initial access of the right internal jugular vein was performed. A guidewire would not advance beyond the confluence of the brachiocephalic veins at the origin of the upper SVC. Under fluoroscopy, there is clearly a large amount of calcification within the  upper SVC likely reflecting calcified chronic thrombus which is present above the level of an indwelling lower IVC stent. Confirmation of venous occlusion was performed with a catheter and guidewire which could not be advanced below the level of chronic venous occlusion. The patient is not a candidate for future tunnel catheter placements in the chest due to chronic IVC occlusion. The right common femoral vein was accessed. A guidewire initially advanced through the common femoral and external iliac veins easily and met some resistance at the confluence of the common iliac veins and expected lower IVC. A 5 French catheter was able  to be advanced over a guidewire to about the level of the mid IVC but there was further resistance to guidewire and catheter passage at this level likely on the basis of chronic stenosis of the IVC. This is also near the location of previous translumbar IVC access for catheter placement. After right femoral tunneled catheter placement, the tip lies in the lower IVC. The catheter aspirates well and is ready for immediate use. Flow rates may be somewhat limited given probable IVC stenosis above the level of the tip of the dialysis catheter. However, this is the best access possible currently for a tunneled catheter and other access sites will be quite limited given findings today and prior history. IMPRESSION: 1. Ultrasound-guided venous access of the right basilic vein in the upper arm performed for IV antibiotic administration and IV conscious sedation during dialysis catheter placement. 2. 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 The patient is not a candidate for future catheter placement in the chest given chronic SVC occlusion. 3. Patent right common femoral vein allowing placement of a tunneled dialysis catheter. This catheter was advanced to the level of the lower IVC and measures 28 cm from tip to cuff. A longer catheter was not able to be placed given catheter and guidewire evidence of significant mid IVC stenosis or occlusion based on restricted ability to advance a catheter and guidewire. Electronically Signed   By: Aletta Edouard M.D.   On: 07/11/2019 16:49   IR US Guide Vasc Access Right  Result Date: 07/11/2019 CLINICAL DATA:  Failing renal transplant and need for hemodialysis. History of multiple prior tunneled dialysis  catheter placements and vascular access interventions including prior SVC stenting with documented SVC stent occlusion in 2001, tunneled femoral dialysis catheter placement in [id number] complicated by IVC stenoses and translumbar IVC dialysis catheter placement in 9417 complicated by IVC stenosis. Request now made to try to place a tunneled dialysis catheter for current hemodialysis needs. Peripheral IV access is also needed for IV conscious sedation during the procedure and IV antibiotic administration. EXAM: 1. PERIPHERAL IV ACCESS VIA RIGHT BASILIC VEIN UNDER ULTRASOUND GUIDANCE 2. RIGHT INTERNAL JUGULAR VEIN ACCESS UNDER ULTRASOUND GUIDANCE 3. TUNNELED CENTRAL VENOUS HEMODIALYSIS CATHETER PLACEMENT VIA RIGHT COMMON FEMORAL VEIN WITH ULTRASOUND AND FLUOROSCOPIC GUIDANCE ANESTHESIA/SEDATION: 1.0 mg IV Versed; 25 mcg IV Fentanyl. Total Moderate Sedation Time:   35 minutes. The patient's level of consciousness and physiologic status were continuously monitored during the procedure by Radiology nursing. MEDICATIONS: 2 g IV Ancef. FLUOROSCOPY TIME:  3 minutes and 24 seconds.  18 mGy. PROCEDURE: The procedure, risks, benefits, and alternatives were explained to the patient. Questions regarding the procedure were  encouraged and answered. The patient understands and consents to the procedure. A timeout was performed prior to initiating the procedure. Ultrasound was used to confirm patency of the right basilic vein in the upper arm. Skin of the right medial upper arm was prepped with chlorhexidine. 1% lidocaine was infiltrated at the level of the skin and subcutaneous tissues. Under ultrasound guidance, a 21 gauge needle was advanced into the right basilic vein. A guidewire was advanced. A 5 French micropuncture dilator was then advanced into the vein. This was capped and secured for IV access during the procedure for IV antibiotic administration and IV conscious sedation. Preliminary ultrasound was performed of the right  and left neck and right groin. Ultrasound was used to confirm patency of the right internal jugular vein and right common femoral vein. The right neck and right groin were prepped with chlorhexidine in a sterile fashion, and a sterile drape was applied covering the operative field. Maximum barrier sterile technique with sterile gowns and gloves were used for the procedure. Local anesthesia was provided with 1% lidocaine. A 21 gauge needle was advanced into the right internal jugular vein under direct, real-time ultrasound guidance. A guidewire was advanced. A transitional dilator was placed. Over a guidewire a 5 [place] catheter was then advanced. Attempt was made to advance the catheter into the SVC over a guidewire. Fluoroscopic spot images were obtained. Access of the right internal jugular vein was then abandoned and hemostasis obtained with manual compression. Under ultrasound guidance, a 21 gauge needle was advanced into the right common femoral vein. Ultrasound image documentation was performed. A venotomy incision was made in the right groin region. After securing guidewire access, a 5 French catheter was advanced over a guidewire. Guidewire advancement was performed into the inferior vena cava. A wire was kinked to measure appropriate tunneled dialysis catheter length. A Palindrome tunneled hemodialysis catheter measuring 28 cm from tip to cuff was chosen for placement. This was tunneled in a retrograde fashion from the right thigh to the venotomy incision. At the venotomy, serial dilatation was performed and a 16 Fr peel-away sheath was placed over a guidewire. The catheter was then placed through the sheath and the sheath removed. Final catheter positioning was confirmed and documented with a fluoroscopic spot image. The catheter was aspirated, flushed with saline, and injected with appropriate volume heparin dwells. The venotomy incision was closed with subcuticular 4-0 Vicryl. Dermabond was applied to  the incision. The catheter exit site was secured with a Prolene retention suture. COMPLICATIONS: None. FINDINGS: Initial ultrasound demonstrates a patent right internal jugular vein. There is no patent left internal jugular vein present with multiple small collateral veins present in the left neck. The right common femoral vein is normally patent in the right groin. Initial access of the right internal jugular vein was performed. A guidewire would not advance beyond the confluence of the brachiocephalic veins at the origin of the upper SVC. 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 Confirmation of venous occlusion was performed with a catheter and guidewire which could not be advanced below the level of chronic venous occlusion. The patient is not a candidate for future tunnel catheter placements in the chest due to chronic IVC occlusion. The right common femoral vein was accessed. A guidewire initially advanced through the common femoral and external iliac veins easily and met some resistance at  the confluence of the common iliac veins and expected lower IVC. A 5 French catheter was able to be advanced over a guidewire to about the level of the mid IVC but there was further resistance to guidewire and catheter passage at this level likely on the basis of chronic stenosis of the IVC. This is also near the location of previous translumbar IVC access for catheter placement. After right femoral tunneled catheter placement, the tip lies in the lower IVC. The catheter aspirates well and is ready for immediate use. Flow rates may be somewhat limited given probable IVC stenosis above the level of the tip of the dialysis catheter. However, this is the best access possible currently for a tunneled catheter and other access sites will be quite limited given findings  today and prior history. IMPRESSION: 1. Ultrasound-guided venous access of the right basilic vein in the upper arm performed for IV antibiotic administration and IV conscious sedation during dialysis catheter placement. 2. 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 The patient is not a candidate for future catheter placement in the chest given chronic SVC occlusion. 3. Patent right common femoral vein allowing placement of a tunneled dialysis catheter. This catheter was advanced to the level of the lower IVC and measures 28 cm from tip to cuff. A longer catheter was not able to be placed given catheter and guidewire evidence of significant mid IVC stenosis or occlusion based on restricted ability to advance a catheter and guidewire. Electronically Signed   By: Aletta Edouard M.D.   On: 07/11/2019 16:49   IR RADIOLOGY PERIPHERAL GUIDED IV START  Result Date: 07/11/2019 CLINICAL DATA:  Failing renal transplant and need for hemodialysis. History of multiple prior tunneled dialysis catheter placements and vascular access interventions including prior SVC stenting with documented SVC stent occlusion in 2001, tunneled femoral dialysis catheter placement in [id number] complicated by IVC stenoses and translumbar IVC dialysis catheter placement in 7989 complicated by IVC stenosis. Request now made to try to place a tunneled dialysis catheter for current hemodialysis needs. Peripheral IV access is also needed for IV conscious sedation during the procedure and IV antibiotic administration. EXAM: 1. PERIPHERAL IV ACCESS VIA RIGHT BASILIC VEIN UNDER ULTRASOUND GUIDANCE 2. RIGHT INTERNAL JUGULAR VEIN ACCESS UNDER ULTRASOUND GUIDANCE 3. TUNNELED CENTRAL  VENOUS HEMODIALYSIS CATHETER PLACEMENT VIA RIGHT COMMON FEMORAL VEIN WITH ULTRASOUND AND FLUOROSCOPIC GUIDANCE ANESTHESIA/SEDATION: 1.0 mg IV Versed; 25 mcg IV Fentanyl. Total Moderate Sedation Time:   35 minutes. The patient's level of consciousness and physiologic status were continuously monitored during the procedure by Radiology nursing. MEDICATIONS: 2 g IV Ancef. FLUOROSCOPY TIME:  3 minutes and 24 seconds.  18 mGy. PROCEDURE: The procedure, risks, benefits, and alternatives were explained to the patient. Questions regarding the procedure were encouraged and answered. The patient understands and consents to the procedure. A timeout was performed prior to initiating the procedure. Ultrasound was used to confirm patency of the right basilic vein in the upper arm. Skin of the right medial upper arm was prepped with chlorhexidine. 1% lidocaine was infiltrated at the level of the skin and subcutaneous tissues. Under ultrasound guidance, a 21 gauge needle was advanced into the right basilic vein. A guidewire was advanced. A 5 French micropuncture dilator was then advanced into  the vein. This was capped and secured for IV access during the procedure for IV antibiotic administration and IV conscious sedation. Preliminary ultrasound was performed of the right and left neck and right groin. Ultrasound was used to confirm patency of the right internal jugular vein and right common femoral vein. The right neck and right groin were prepped with chlorhexidine in a sterile fashion, and a sterile drape was applied covering the operative field. Maximum barrier sterile technique with sterile gowns and gloves were used for the procedure. Local anesthesia was provided with 1% lidocaine. A 21 gauge needle was advanced into the right internal jugular vein under direct, real-time ultrasound guidance. A guidewire was advanced. A transitional dilator was placed. Over a guidewire a 5 [place] catheter was then advanced. Attempt was made  to advance the catheter into the SVC over a guidewire. Fluoroscopic spot images were obtained. Access of the right internal jugular vein was then abandoned and hemostasis obtained with manual compression. Under ultrasound guidance, a 21 gauge needle was advanced into the right common femoral vein. Ultrasound image documentation was performed. A venotomy incision was made in the right groin region. After securing guidewire access, a 5 French catheter was advanced over a guidewire. Guidewire advancement was performed into the inferior vena cava. A wire was kinked to measure appropriate tunneled dialysis catheter length. A Palindrome tunneled hemodialysis catheter measuring 28 cm from tip to cuff was chosen for placement. This was tunneled in a retrograde fashion from the right thigh to the venotomy incision. At the venotomy, serial dilatation was performed and a 16 Fr peel-away sheath was placed over a guidewire. The catheter was then placed through the sheath and the sheath removed. Final catheter positioning was confirmed and documented with a fluoroscopic spot image. The catheter was aspirated, flushed with saline, and injected with appropriate volume heparin dwells. The venotomy incision was closed with subcuticular 4-0 Vicryl. Dermabond was applied to the incision. The catheter exit site was secured with a Prolene retention suture. COMPLICATIONS: None. FINDINGS: Initial ultrasound demonstrates a patent right internal jugular vein. There is no patent left internal jugular vein present with multiple small collateral veins present in the left neck. The right common femoral vein is normally patent in the right groin. Initial access of the right internal jugular vein was performed. A guidewire would not advance beyond the confluence of the brachiocephalic veins at the origin of the upper SVC. Under fluoroscopy, there is clearly a large amount of calcification within the upper SVC likely reflecting calcified chronic  thrombus which is present above the level of an indwelling lower IVC stent. Confirmation of venous occlusion was performed with a catheter and guidewire which could not be advanced below the level of chronic venous occlusion. The patient is not a candidate for future tunnel catheter placements in the chest due to chronic IVC occlusion. The right common femoral vein was accessed. A guidewire initially advanced through the common femoral and external iliac veins easily and met some resistance at the confluence of the common iliac veins and expected lower IVC. A 5 French catheter was able to be advanced over a guidewire to about the level of the mid IVC but there was further resistance to guidewire and catheter passage at this level likely on the basis of chronic stenosis of the IVC. This is also near the location of previous translumbar IVC access for catheter placement. After right femoral tunneled catheter placement, the tip lies in the lower IVC. The catheter aspirates well and  is ready for immediate use. Flow rates may be somewhat limited given probable IVC stenosis above the level of the tip of the dialysis catheter. However, this is the best access possible currently for a tunneled catheter and other access sites will be quite limited given findings today and prior history. IMPRESSION: 1. Ultrasound-guided venous access of the right basilic vein in the upper arm performed for IV antibiotic administration and IV conscious sedation during dialysis catheter placement. 2. 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 The patient is not a candidate for future catheter placement in the chest given chronic SVC occlusion. 3. Patent right common  femoral vein allowing placement of a tunneled dialysis catheter. This catheter was advanced to the level of the lower IVC and measures 28 cm from tip to cuff. A longer catheter was not able to be placed given catheter and guidewire evidence of significant mid IVC stenosis or occlusion based on restricted ability to advance a catheter and guidewire. Electronically Signed   By: Aletta Edouard M.D.   On: 07/11/2019 16:49     Medical Consultants:    None.  Anti-Infectives:   none  Subjective:    Tina Watkins reports improvement in her gout pain and swelling  Objective:    Vitals:   07/12/19 2348 07/13/19 0314 07/13/19 0839 07/13/19 1143  BP: (!) 90/52  (!) 87/51 (!) 95/49  Pulse: 74  70 (!) 55  Resp: '18  14 15  '$ Temp: 98.3 F (36.8 C) (!) 97.5 F (36.4 C) 97.8 F (36.6 C) 98.2 F (36.8 C)  TempSrc: Oral Oral Oral Oral  SpO2: 92%  90%   Weight:      Height:       SpO2: 90 % O2 Flow Rate (L/min): 2 L/min   Intake/Output Summary (Last 24 hours) at 07/13/2019 1147 Last data filed at 07/13/2019 0400 Gross per 24 hour  Intake 240 ml  Output 1500 ml  Net -1260 ml   Filed Weights   07/11/19 1823 07/12/19 1315 07/12/19 1617  Weight: 60.2 kg 60.8 kg 59 kg    Exam: General exam: Chronically ill female appears much older than stated age, AAOx3 HEENT: No JVD CVS S1-S2 regular rate rhythm Lungs with few basilar rales otherwise clear Abdomen is soft, nontender, bowel sounds present Extremities trace edema, swelling and tenderness of the right ankle and first MTP joint Skin: No rashes on exposed skin Psychiatry: Flat affect  Data Reviewed:    Labs: Basic Metabolic Panel: Recent Labs  Lab 07/08/19 0350 07/08/19 0350 07/09/19 0312 07/09/19 0312 07/10/19 0415 07/10/19 0415 07/11/19 0422 07/12/19 0326  NA 137  --  139  --  137  --  137 139  K 5.0   < > 5.3*   < > 4.7   < > 5.0 4.4  CL 106  --  105  --  106  --  101 104  CO2 20*  --  19*  --  19*  --   22 22  GLUCOSE 82  --  80  --  80  --  88 87  BUN 82*  --  84*  --  85*  --  87* 50*  CREATININE 5.61*  --  5.33*  --  5.56*  --  5.86* 4.40*  CALCIUM 9.1  --  9.3  --  9.1  --  9.4 8.9  PHOS  --   --  6.0*  --  5.9*  --   --   --    < > = values in this interval not displayed.   GFR Estimated Creatinine Clearance: 11.8 mL/min (A) (by C-G formula based on SCr of 4.4 mg/dL (H)). Liver Function Tests: Recent Labs  Lab 07/07/19 1033 07/09/19 0312 07/10/19 0415 07/11/19 0422 07/12/19 0326  AST 30  --  '24 21 24  '$ ALT 14  --  '11 13 12  '$ ALKPHOS 125  --  116 112 111  BILITOT 1.4*  --  1.5* 1.5* 1.8*  PROT 5.9*  --  5.7* 6.0* 6.1*  ALBUMIN 3.5 3.4* 3.5 3.6 3.6   Recent Labs  Lab 07/07/19 1033  LIPASE 44   No results for input(s): AMMONIA in the last 168 hours. Coagulation profile Recent Labs  Lab 07/11/19 0945  INR 1.1   COVID-19 Labs  No results for input(s): DDIMER, FERRITIN, LDH, CRP in the last 72 hours.  Lab Results  Component Value Date   SARSCOV2NAA POSITIVE (A) 05/09/2019   [place] NEGATIVE 03/17/2019    CBC: Recent Labs  Lab 07/09/19 1037 07/10/19 0415 07/11/19 0422 07/12/19 0326 07/13/19 0634  WBC 1.5* 1.3* 1.7* 1.8* 2.3*  NEUTROABS 1.0* 1.0* 1.1* 1.0* 1.7  HGB 8.1* 7.9* 7.7* 8.3* 9.0*  HCT 25.5* 24.2* 24.0* 25.6* 28.1*  MCV 103.7* 102.1* 103.4* 103.2* 104.1*  PLT 125* 115* 133* 134* 152   Cardiac Enzymes: No results for input(s): CKTOTAL, CKMB, CKMBINDEX, TROPONINI in the last 168 hours. BNP (last 3 results) No results for input(s): PROBNP in the last 8760 hours. CBG: No results for input(s): GLUCAP in the last 168 hours. D-Dimer: No results for input(s): DDIMER in the last 72 hours. Hgb A1c: No results for input(s): HGBA1C in the last 72 hours. Lipid Profile: No results for input(s): CHOL, HDL, LDLCALC, TRIG, CHOLHDL, LDLDIRECT in the last 72 hours. Thyroid function studies: No results for input(s): TSH, T4TOTAL, T3FREE, THYROIDAB in  the last 72 hours.  Invalid input(s): FREET3 Anemia work up: No results for input(s): VITAMINB12, FOLATE, FERRITIN, TIBC, IRON, RETICCTPCT in the last 72 hours. Sepsis Labs: Recent Labs  Lab 07/10/19 0415 07/11/19 0422 07/12/19 0326 07/13/19 0634  WBC 1.3* 1.7* 1.8* 2.3*   Microbiology No results found for this or any previous visit (from the past 240 hour(s)).   Medications:   . aspirin EC  81 mg Oral Daily  . Chlorhexidine Gluconate Cloth  6 each Topical Daily  . [START ON 07/15/2019] colchicine  0.6 mg Oral Q72H  . darbepoetin (ARANESP) injection - NON-DIALYSIS  100 mcg Subcutaneous Q Mon-1800  . famotidine  20 mg Oral Daily  . gabapentin  100 mg Oral BID  . heparin  5,000 Units Subcutaneous Q8H  . latanoprost  1 drop Left Eye QHS  . midodrine  15 mg Oral TID WC  . pneumococcal 23 valent vaccine  0.5 mL Intramuscular Tomorrow-1000  . [START ON 07/14/2019] predniSONE  30 mg Oral Daily  . sevelamer carbonate  800 mg Oral TID WC  . tacrolimus  2 mg Oral QHS  . tacrolimus  3 mg Oral q morning - 10a  . Vitamin D (Ergocalciferol)  50,000 Units Oral Q Sun   Continuous Infusions:   LOS: 6 days   Domenic Polite  [facility]  07/13/2019, 11:47 AM

## 2019-07-13 NOTE — Plan of Care (Signed)

## 2019-07-13 NOTE — Progress Notes (Signed)
Patient ID: Tina Watkins, female   DOB: 1965/04/10, 54 y.o.   MRN: [id number] [facility] Progress Note   Assessment/ Plan:   1. Acute kidney Injury on chronic kidney disease stage IV T-now likely end-stage renal disease: With progression following significant acute kidney injury after COVID-19 infection 2 months ago.  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  Her third dialysis treatment will be today and the process underway for outpatient dialysis unit placement. 2.  Status post kidney transplant: Usually on immunosuppressive therapy with Prograf, Imuran and prednisone with Imuran currently on hold due to neutropenia. 3.  Anemia of chronic kidney disease: With adequate iron stores and status post PRBC transfusion however, downtrending H&H.  Started on darbepoetin. 4.  Hypotension: Asymptomatic on midodrine-increase dose to 50 mg 3 times daily.  Will maintain caution with ultrafiltration/HD.  5.  Secondary hyperparathyroidism: Calcium level acceptable with elevated phosphorus noted, monitor with low-dose sevelamer. 6.  Acute gout flare: On higher dose prednisone 40 mg daily along with colchicine 0.6 mg every 72 hours x 2 doses.  Subjective:   Reports improving joint pain/podagra on prednisone/colchicine.    Objective:   BP (!) 90/52   Pulse 74   Temp (!) 97.5 F (36.4 C) (Oral)   Resp 18   Ht '4\' 11"'$  (1.499 m)   Wt 59 kg   SpO2 92%   BMI 26.27 kg/m   Intake/Output Summary (Last 24 hours) at 07/13/2019 0654 Last data filed at 07/13/2019 0400 Gross per 24 hour  Intake 240 ml  Output 1500 ml  Net -1260 ml   Weight change: -0.4 kg  Physical Exam: Gen: Resting comfortably in bed, easy to awaken and engage in  conversation CVS: Pulse regular rhythm, normal rate, S1 and S2 normal Resp: Diminished breath sounds over bases, no distinct rales or rhonchi Abd: Soft, obese, nontender Ext: Trace lower extremity edema.  Right femoral TDC.  Imaging: IR Fluoro Guide CV Line Right  Result Date: 07/11/2019 CLINICAL DATA:  Failing renal transplant and need for hemodialysis. History of multiple prior tunneled dialysis catheter placements and vascular access interventions including prior SVC stenting with documented SVC stent occlusion in 2001, tunneled femoral dialysis catheter placement in 8502-7741 complicated by IVC stenoses and translumbar IVC dialysis catheter placement in 2878 complicated by IVC stenosis. Request now made to try to place a tunneled dialysis catheter for current hemodialysis needs. Peripheral IV access is also needed for IV conscious sedation during the procedure and IV antibiotic administration. EXAM: 1. PERIPHERAL IV ACCESS VIA RIGHT BASILIC VEIN UNDER ULTRASOUND GUIDANCE 2. RIGHT INTERNAL JUGULAR VEIN ACCESS UNDER ULTRASOUND GUIDANCE 3. TUNNELED CENTRAL VENOUS HEMODIALYSIS CATHETER PLACEMENT VIA RIGHT COMMON FEMORAL VEIN WITH ULTRASOUND AND FLUOROSCOPIC GUIDANCE ANESTHESIA/SEDATION: 1.0 mg IV Versed; 25 mcg IV Fentanyl. Total Moderate Sedation Time:   35 minutes. The patient's level of consciousness and physiologic status were continuously monitored during the procedure by Radiology nursing. MEDICATIONS: 2 g IV Ancef. FLUOROSCOPY TIME:  3 minutes and 24 seconds.  18 mGy. PROCEDURE: The procedure, risks, benefits, and alternatives were explained to the patient. Questions regarding the procedure were encouraged and answered. The patient understands and consents to the procedure. A timeout was performed prior to initiating the procedure. Ultrasound was used to confirm patency of the right  basilic vein in the upper arm. Skin of the right medial upper arm was prepped with chlorhexidine. 1% lidocaine was  infiltrated at the level of the skin and subcutaneous tissues. Under ultrasound guidance, a 21 gauge needle was advanced into the right basilic vein. A guidewire was advanced. A 5 French micropuncture dilator was then advanced into the vein. This was capped and secured for IV access during the procedure for IV antibiotic administration and IV conscious sedation. Preliminary ultrasound was performed of the right and left neck and right groin. Ultrasound was used to confirm patency of the right internal jugular vein and right common femoral vein. The right neck and right groin were prepped with chlorhexidine in a sterile fashion, and a sterile drape was applied covering the operative field. Maximum barrier sterile technique with sterile gowns and gloves were used for the procedure. Local anesthesia was provided with 1% lidocaine. A 21 gauge needle was advanced into the right internal jugular vein under direct, real-time ultrasound guidance. A guidewire was advanced. A transitional dilator was placed. Over a guidewire a 5 [place] catheter was then advanced. Attempt was made to advance the catheter into the SVC over a guidewire. Fluoroscopic spot images were obtained. Access of the right internal jugular vein was then abandoned and hemostasis obtained with manual compression. Under ultrasound guidance, a 21 gauge needle was advanced into the right common femoral vein. Ultrasound image documentation was performed. A venotomy incision was made in the right groin region. After securing guidewire access, a 5 French catheter was advanced over a guidewire. Guidewire advancement was performed into the inferior vena cava. A wire was kinked to measure appropriate tunneled dialysis catheter length. A Palindrome tunneled hemodialysis catheter measuring 28 cm from tip to cuff was chosen for placement. This was tunneled in a retrograde fashion from the right thigh to the venotomy incision. At the venotomy, serial dilatation was  performed and a 16 Fr peel-away sheath was placed over a guidewire. The catheter was then placed through the sheath and the sheath removed. Final catheter positioning was confirmed and documented with a fluoroscopic spot image. The catheter was aspirated, flushed with saline, and injected with appropriate volume heparin dwells. The venotomy incision was closed with subcuticular 4-0 Vicryl. Dermabond was applied to the incision. The catheter exit site was secured with a Prolene retention suture. COMPLICATIONS: None. FINDINGS: Initial ultrasound demonstrates a patent right internal jugular vein. There is no patent left internal jugular vein present with multiple small collateral veins present in the left neck. The right common femoral vein is normally patent in the right groin. Initial access of the right internal jugular vein was performed. A guidewire would not advance beyond the confluence of the brachiocephalic veins at the origin of the upper SVC. 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 Confirmation of venous occlusion was performed with a catheter and guidewire which could not be advanced below the level of chronic venous occlusion. The patient is not a candidate for future tunnel catheter placements in the chest due to chronic IVC occlusion. The right common femoral vein was accessed. A guidewire initially advanced through the common femoral and external iliac veins easily and met some resistance at the confluence of the common iliac veins and expected lower IVC. A 5 French catheter was able to be advanced over a guidewire to about the level of the  mid IVC but there was further resistance to guidewire and catheter passage at this level likely on the basis of chronic stenosis of the IVC. This is also near the location of previous translumbar IVC access  for catheter placement. After right femoral tunneled catheter placement, the tip lies in the lower IVC. The catheter aspirates well and is ready for immediate use. Flow rates may be somewhat limited given probable IVC stenosis above the level of the tip of the dialysis catheter. However, this is the best access possible currently for a tunneled catheter and other access sites will be quite limited given findings today and prior history. IMPRESSION: 1. Ultrasound-guided venous access of the right basilic vein in the upper arm performed for IV antibiotic administration and IV conscious sedation during dialysis catheter placement. 2. 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 The patient is not a candidate for future catheter placement in the chest given chronic SVC occlusion. 3. Patent right common femoral vein allowing placement of a tunneled dialysis catheter. This catheter was advanced to the level of the lower IVC and measures 28 cm from tip to cuff. A longer catheter was not able to be placed given catheter and guidewire evidence of significant mid IVC stenosis or occlusion based on restricted ability to advance a catheter and guidewire. Electronically Signed   By: Aletta Edouard M.D.   On: 07/11/2019 16:49   IR US Guide Vasc Access Right  Result Date: 07/11/2019 CLINICAL DATA:  Failing renal transplant and need for hemodialysis. History of multiple prior tunneled dialysis catheter placements and vascular access interventions including prior SVC stenting with documented SVC stent occlusion in 2001, tunneled femoral dialysis catheter placement in 1594-5859 complicated by IVC stenoses and translumbar IVC dialysis catheter placement in  2924 complicated by IVC stenosis. Request now made to try to place a tunneled dialysis catheter for current hemodialysis needs. Peripheral IV access is also needed for IV conscious sedation during the procedure and IV antibiotic administration. EXAM: 1. PERIPHERAL IV ACCESS VIA RIGHT BASILIC VEIN UNDER ULTRASOUND GUIDANCE 2. RIGHT INTERNAL JUGULAR VEIN ACCESS UNDER ULTRASOUND GUIDANCE 3. TUNNELED CENTRAL VENOUS HEMODIALYSIS CATHETER PLACEMENT VIA RIGHT COMMON FEMORAL VEIN WITH ULTRASOUND AND FLUOROSCOPIC GUIDANCE ANESTHESIA/SEDATION: 1.0 mg IV Versed; 25 mcg IV Fentanyl. Total Moderate Sedation Time:   35 minutes. The patient's level of consciousness and physiologic status were continuously monitored during the procedure by Radiology nursing. MEDICATIONS: 2 g IV Ancef. FLUOROSCOPY TIME:  3 minutes and 24 seconds.  18 mGy. PROCEDURE: The procedure, risks, benefits, and alternatives were explained to the patient. Questions regarding the procedure were encouraged and answered. The patient understands and consents to the procedure. A timeout was performed prior to initiating the procedure. Ultrasound was used to confirm patency of the right basilic vein in the upper arm. Skin of the right medial upper arm was prepped with chlorhexidine. 1% lidocaine was infiltrated at the level of the skin and subcutaneous tissues. Under ultrasound guidance, a 21 gauge needle was advanced into the right basilic vein. A guidewire was advanced. A 5 French micropuncture dilator was then advanced into the vein. This was capped and secured for IV access during the procedure for IV antibiotic administration and IV conscious sedation. Preliminary ultrasound was performed of the right and  left neck and right groin. Ultrasound was used to confirm patency of the right internal jugular vein and right common femoral vein. The right neck and right groin were prepped with chlorhexidine in a sterile fashion, and a sterile drape was applied covering  the operative field. Maximum barrier sterile technique with sterile gowns and gloves were used for the procedure. Local anesthesia was provided with 1% lidocaine. A 21 gauge needle was advanced into the right internal jugular vein under direct, real-time ultrasound guidance. A guidewire was advanced. A transitional dilator was placed. Over a guidewire a 5 [place] catheter was then advanced. Attempt was made to advance the catheter into the SVC over a guidewire. Fluoroscopic spot images were obtained. Access of the right internal jugular vein was then abandoned and hemostasis obtained with manual compression. Under ultrasound guidance, a 21 gauge needle was advanced into the right common femoral vein. Ultrasound image documentation was performed. A venotomy incision was made in the right groin region. After securing guidewire access, a 5 French catheter was advanced over a guidewire. Guidewire advancement was performed into the inferior vena cava. A wire was kinked to measure appropriate tunneled dialysis catheter length. A Palindrome tunneled hemodialysis catheter measuring 28 cm from tip to cuff was chosen for placement. This was tunneled in a retrograde fashion from the right thigh to the venotomy incision. At the venotomy, serial dilatation was performed and a 16 Fr peel-away sheath was placed over a guidewire. The catheter was then placed through the sheath and the sheath removed. Final catheter positioning was confirmed and documented with a fluoroscopic spot image. The catheter was aspirated, flushed with saline, and injected with appropriate volume heparin dwells. The venotomy incision was closed with subcuticular 4-0 Vicryl. Dermabond was applied to the incision. The catheter exit site was secured with a Prolene retention suture. COMPLICATIONS: None. FINDINGS: Initial ultrasound demonstrates a patent right internal jugular vein. There is no patent left internal jugular vein present with multiple small  collateral veins present in the left neck. The right common femoral vein is normally patent in the right groin. Initial access of the right internal jugular vein was performed. A guidewire would not advance beyond the confluence of the brachiocephalic veins at the origin of the upper SVC. 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 Confirmation of venous occlusion was performed with a catheter and guidewire which could not be advanced below the level of chronic venous occlusion. The patient is not a candidate for future tunnel catheter placements in the chest due to chronic IVC occlusion. The right common femoral vein was accessed. A guidewire initially advanced through the common femoral and external iliac veins easily and met some resistance at the confluence of the common iliac veins and expected lower IVC. A 5 French catheter was able to be advanced over a guidewire to about the level of the mid IVC but there was further resistance to guidewire and catheter passage at this level likely on the basis of chronic stenosis of the IVC. This is also near the location of previous translumbar IVC access for catheter placement. After right femoral tunneled catheter placement, the tip lies in the lower IVC. The catheter aspirates well and is ready for immediate use. Flow rates may be somewhat limited given probable IVC stenosis above the level of the tip of the dialysis catheter. However, this is the  best access possible currently for a tunneled catheter and other access sites will be quite limited given findings today and prior history. IMPRESSION: 1. Ultrasound-guided venous access of the right basilic vein in the upper arm performed for IV antibiotic administration and IV conscious sedation during dialysis catheter placement. 2. Patent right internal jugular vein  was accessed and chronic occlusion of the entire SVC confirmed by fluoroscopic probing with a catheter and guidewire. 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 The patient is not a candidate for future catheter placement in the chest given chronic SVC occlusion. 3. Patent right common femoral vein allowing placement of a tunneled dialysis catheter. This catheter was advanced to the level of the lower IVC and measures 28 cm from tip to cuff. A longer catheter was not able to be placed given catheter and guidewire evidence of significant mid IVC stenosis or occlusion based on restricted ability to advance a catheter and guidewire. Electronically Signed   By: Aletta Edouard M.D.   On: 07/11/2019 16:49   IR US Guide Vasc Access Right  Result Date: 07/11/2019 CLINICAL DATA:  Failing renal transplant and need for hemodialysis. History of multiple prior tunneled dialysis catheter placements and vascular access interventions including prior SVC stenting with documented SVC stent occlusion in 2001, tunneled femoral dialysis catheter placement in 4765-4650 complicated by IVC stenoses and translumbar IVC dialysis catheter placement in 3546 complicated by IVC stenosis. Request now made to try to place a tunneled dialysis catheter for current hemodialysis needs. Peripheral IV access is also needed for IV conscious sedation during the procedure and IV antibiotic administration. EXAM: 1. PERIPHERAL IV ACCESS VIA RIGHT BASILIC VEIN UNDER ULTRASOUND GUIDANCE 2. RIGHT INTERNAL JUGULAR VEIN ACCESS UNDER ULTRASOUND GUIDANCE 3. TUNNELED CENTRAL VENOUS HEMODIALYSIS CATHETER PLACEMENT VIA RIGHT COMMON FEMORAL VEIN WITH ULTRASOUND AND FLUOROSCOPIC GUIDANCE ANESTHESIA/SEDATION: 1.0 mg IV Versed; 25 mcg IV Fentanyl. Total Moderate Sedation Time:   35 minutes. The patient's level of consciousness and physiologic  status were continuously monitored during the procedure by Radiology nursing. MEDICATIONS: 2 g IV Ancef. FLUOROSCOPY TIME:  3 minutes and 24 seconds.  18 mGy. PROCEDURE: The procedure, risks, benefits, and alternatives were explained to the patient. Questions regarding the procedure were encouraged and answered. The patient understands and consents to the procedure. A timeout was performed prior to initiating the procedure. Ultrasound was used to confirm patency of the right basilic vein in the upper arm. Skin of the right medial upper arm was prepped with chlorhexidine. 1% lidocaine was infiltrated at the level of the skin and subcutaneous tissues. Under ultrasound guidance, a 21 gauge needle was advanced into the right basilic vein. A guidewire was advanced. A 5 French micropuncture dilator was then advanced into the vein. This was capped and secured for IV access during the procedure for IV antibiotic administration and IV conscious sedation. Preliminary ultrasound was performed of the right and left neck and right groin. Ultrasound was used to confirm patency of the right internal jugular vein and right common femoral vein. The right neck and right groin were prepped with chlorhexidine in a sterile fashion, and a sterile drape was applied covering the operative field. Maximum barrier sterile technique with sterile gowns and gloves were used for the procedure. Local anesthesia was provided with 1% lidocaine. A 21 gauge needle was advanced into the right internal jugular vein under direct, real-time ultrasound guidance. A guidewire  was advanced. A transitional dilator was placed. Over a guidewire a 5 [place] catheter was then advanced. Attempt was made to advance the catheter into the SVC over a guidewire. Fluoroscopic spot images were obtained. Access of the right internal jugular vein was then abandoned and hemostasis obtained with manual compression. Under ultrasound guidance, a 21 gauge needle was advanced into  the right common femoral vein. Ultrasound image documentation was performed. A venotomy incision was made in the right groin region. After securing guidewire access, a 5 French catheter was advanced over a guidewire. Guidewire advancement was performed into the inferior vena cava. A wire was kinked to measure appropriate tunneled dialysis catheter length. A Palindrome tunneled hemodialysis catheter measuring 28 cm from tip to cuff was chosen for placement. This was tunneled in a retrograde fashion from the right thigh to the venotomy incision. At the venotomy, serial dilatation was performed and a 16 Fr peel-away sheath was placed over a guidewire. The catheter was then placed through the sheath and the sheath removed. Final catheter positioning was confirmed and documented with a fluoroscopic spot image. The catheter was aspirated, flushed with saline, and injected with appropriate volume heparin dwells. The venotomy incision was closed with subcuticular 4-0 Vicryl. Dermabond was applied to the incision. The catheter exit site was secured with a Prolene retention suture. COMPLICATIONS: None. FINDINGS: Initial ultrasound demonstrates a patent right internal jugular vein. There is no patent left internal jugular vein present with multiple small collateral veins present in the left neck. The right common femoral vein is normally patent in the right groin. Initial access of the right internal jugular vein was performed. A guidewire would not advance beyond the confluence of the brachiocephalic veins at the origin of the upper SVC. 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 Confirmation of venous occlusion was performed with a catheter and guidewire which could not be advanced below the level of chronic venous occlusion. The patient is not a candidate for future  tunnel catheter placements in the chest due to chronic IVC occlusion. The right common femoral vein was accessed. A guidewire initially advanced through the common femoral and external iliac veins easily and met some resistance at the confluence of the common iliac veins and expected lower IVC. A 5 French catheter was able to be advanced over a guidewire to about the level of the mid IVC but there was further resistance to guidewire and catheter passage at this level likely on the basis of chronic stenosis of the IVC. This is also near the location of previous translumbar IVC access for catheter placement. After right femoral tunneled catheter placement, the tip lies in the lower IVC. The catheter aspirates well and is ready for immediate use. Flow rates may be somewhat limited given probable IVC stenosis above the level of the tip of the dialysis catheter. However, this is the best access possible currently for a tunneled catheter and other access sites will be quite limited given findings today and prior history. IMPRESSION: 1. Ultrasound-guided venous access of the right basilic vein in the upper arm performed for IV antibiotic administration and IV conscious sedation during dialysis catheter placement. 2. 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 The upper SVC above the level of an indwelling stent is calcified, likely  reflecting chronic calcified thrombus. 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 The patient is not a candidate for future catheter placement in the chest given chronic SVC occlusion. 3. Patent right common femoral vein allowing placement of a tunneled dialysis catheter. This catheter was advanced to the level of the lower IVC and measures 28 cm from tip to cuff. A longer catheter was not able to be placed given catheter and guidewire evidence of significant mid IVC stenosis  or occlusion based on restricted ability to advance a catheter and guidewire. Electronically Signed   By: Aletta Edouard M.D.   On: 07/11/2019 16:49   IR US Guide Vasc Access Right  Result Date: 07/11/2019 CLINICAL DATA:  Failing renal transplant and need for hemodialysis. History of multiple prior tunneled dialysis catheter placements and vascular access interventions including prior SVC stenting with documented SVC stent occlusion in 2001, tunneled femoral dialysis catheter placement in 1610-9604 complicated by IVC stenoses and translumbar IVC dialysis catheter placement in 5409 complicated by IVC stenosis. Request now made to try to place a tunneled dialysis catheter for current hemodialysis needs. Peripheral IV access is also needed for IV conscious sedation during the procedure and IV antibiotic administration. EXAM: 1. PERIPHERAL IV ACCESS VIA RIGHT BASILIC VEIN UNDER ULTRASOUND GUIDANCE 2. RIGHT INTERNAL JUGULAR VEIN ACCESS UNDER ULTRASOUND GUIDANCE 3. TUNNELED CENTRAL VENOUS HEMODIALYSIS CATHETER PLACEMENT VIA RIGHT COMMON FEMORAL VEIN WITH ULTRASOUND AND FLUOROSCOPIC GUIDANCE ANESTHESIA/SEDATION: 1.0 mg IV Versed; 25 mcg IV Fentanyl. Total Moderate Sedation Time:   35 minutes. The patient's level of consciousness and physiologic status were continuously monitored during the procedure by Radiology nursing. MEDICATIONS: 2 g IV Ancef. FLUOROSCOPY TIME:  3 minutes and 24 seconds.  18 mGy. PROCEDURE: The procedure, risks, benefits, and alternatives were explained to the patient. Questions regarding the procedure were encouraged and answered. The patient understands and consents to the procedure. A timeout was performed prior to initiating the procedure. Ultrasound was used to confirm patency of the right basilic vein in the upper arm. Skin of the right medial upper arm was prepped with chlorhexidine. 1% lidocaine was infiltrated at the level of the skin and subcutaneous tissues. Under ultrasound guidance, a  21 gauge needle was advanced into the right basilic vein. A guidewire was advanced. A 5 French micropuncture dilator was then advanced into the vein. This was capped and secured for IV access during the procedure for IV antibiotic administration and IV conscious sedation. Preliminary ultrasound was performed of the right and left neck and right groin. Ultrasound was used to confirm patency of the right internal jugular vein and right common femoral vein. The right neck and right groin were prepped with chlorhexidine in a sterile fashion, and a sterile drape was applied covering the operative field. Maximum barrier sterile technique with sterile gowns and gloves were used for the procedure. Local anesthesia was provided with 1% lidocaine. A 21 gauge needle was advanced into the right internal jugular vein under direct, real-time ultrasound guidance. A guidewire was advanced. A transitional dilator was placed. Over a guidewire a 5 [place] catheter was then advanced. Attempt was made to advance the catheter into the SVC over a guidewire. Fluoroscopic spot images were obtained. Access of the right internal jugular vein was then abandoned and hemostasis obtained with manual compression. Under ultrasound guidance, a 21 gauge needle was advanced into the right common femoral vein. Ultrasound image documentation was performed. A venotomy incision was made in the right groin region. After securing guidewire access, a  5 French catheter was advanced over a guidewire. Guidewire advancement was performed into the inferior vena cava. A wire was kinked to measure appropriate tunneled dialysis catheter length. A Palindrome tunneled hemodialysis catheter measuring 28 cm from tip to cuff was chosen for placement. This was tunneled in a retrograde fashion from the right thigh to the venotomy incision. At the venotomy, serial dilatation was performed and a 16 Fr peel-away sheath was placed over a guidewire. The catheter was then placed  through the sheath and the sheath removed. Final catheter positioning was confirmed and documented with a fluoroscopic spot image. The catheter was aspirated, flushed with saline, and injected with appropriate volume heparin dwells. The venotomy incision was closed with subcuticular 4-0 Vicryl. Dermabond was applied to the incision. The catheter exit site was secured with a Prolene retention suture. COMPLICATIONS: None. FINDINGS: Initial ultrasound demonstrates a patent right internal jugular vein. There is no patent left internal jugular vein present with multiple small collateral veins present in the left neck. The right common femoral vein is normally patent in the right groin. Initial access of the right internal jugular vein was performed. A guidewire would not advance beyond the confluence of the brachiocephalic veins at the origin of the upper SVC. 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 Confirmation of venous occlusion was performed with a catheter and guidewire which could not be advanced below the level of chronic venous occlusion. The patient is not a candidate for future tunnel catheter placements in the chest due to chronic IVC occlusion. The right common femoral vein was accessed. A guidewire initially advanced through the common femoral and external iliac veins easily and met some resistance at the confluence of the common iliac veins and expected lower IVC. A 5 French catheter was able to be advanced over a guidewire to about the level of the mid IVC but there was further resistance to guidewire and catheter passage at this level likely on the basis of chronic stenosis of the IVC. This is also near the location of previous translumbar IVC access for catheter placement. After right femoral tunneled catheter placement, the tip lies in the lower  IVC. The catheter aspirates well and is ready for immediate use. Flow rates may be somewhat limited given probable IVC stenosis above the level of the tip of the dialysis catheter. However, this is the best access possible currently for a tunneled catheter and other access sites will be quite limited given findings today and prior history. IMPRESSION: 1. Ultrasound-guided venous access of the right basilic vein in the upper arm performed for IV antibiotic administration and IV conscious sedation during dialysis catheter placement. 2. 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 The patient is not a candidate for future catheter placement in the chest given chronic SVC occlusion. 3. Patent right common femoral vein allowing placement of a tunneled dialysis catheter. This catheter was advanced to the level of the lower IVC and measures 28 cm from tip to cuff. A longer catheter was not able to be placed given catheter and guidewire evidence of significant mid IVC stenosis or occlusion based on restricted ability  to advance a catheter and guidewire. Electronically Signed   By: Aletta Edouard M.D.   On: 07/11/2019 16:49   IR RADIOLOGY PERIPHERAL GUIDED IV START  Result Date: 07/11/2019 CLINICAL DATA:  Failing renal transplant and need for hemodialysis. History of multiple prior tunneled dialysis catheter placements and vascular access interventions including prior SVC stenting with documented SVC stent occlusion in 2001, tunneled femoral dialysis catheter placement in 3295-1884 complicated by IVC stenoses and translumbar IVC dialysis catheter placement in 1660 complicated by IVC stenosis. Request now made to try to place a tunneled dialysis  catheter for current hemodialysis needs. Peripheral IV access is also needed for IV conscious sedation during the procedure and IV antibiotic administration. EXAM: 1. PERIPHERAL IV ACCESS VIA RIGHT BASILIC VEIN UNDER ULTRASOUND GUIDANCE 2. RIGHT INTERNAL JUGULAR VEIN ACCESS UNDER ULTRASOUND GUIDANCE 3. TUNNELED CENTRAL VENOUS HEMODIALYSIS CATHETER PLACEMENT VIA RIGHT COMMON FEMORAL VEIN WITH ULTRASOUND AND FLUOROSCOPIC GUIDANCE ANESTHESIA/SEDATION: 1.0 mg IV Versed; 25 mcg IV Fentanyl. Total Moderate Sedation Time:   35 minutes. The patient's level of consciousness and physiologic status were continuously monitored during the procedure by Radiology nursing. MEDICATIONS: 2 g IV Ancef. FLUOROSCOPY TIME:  3 minutes and 24 seconds.  18 mGy. PROCEDURE: The procedure, risks, benefits, and alternatives were explained to the patient. Questions regarding the procedure were encouraged and answered. The patient understands and consents to the procedure. A timeout was performed prior to initiating the procedure. Ultrasound was used to confirm patency of the right basilic vein in the upper arm. Skin of the right medial upper arm was prepped with chlorhexidine. 1% lidocaine was infiltrated at the level of the skin and subcutaneous tissues. Under ultrasound guidance, a 21 gauge needle was advanced into the right basilic vein. A guidewire was advanced. A 5 French micropuncture dilator was then advanced into the vein. This was capped and secured for IV access during the procedure for IV antibiotic administration and IV conscious sedation. Preliminary ultrasound was performed of the right and left neck and right groin. Ultrasound was used to confirm patency of the right internal jugular vein and right common femoral vein. The right neck and right groin were prepped with chlorhexidine in a sterile fashion, and a sterile drape was applied covering the operative field. Maximum barrier sterile technique with sterile gowns and gloves were  used for the procedure. Local anesthesia was provided with 1% lidocaine. A 21 gauge needle was advanced into the right internal jugular vein under direct, real-time ultrasound guidance. A guidewire was advanced. A transitional dilator was placed. Over a guidewire a 5 [place] catheter was then advanced. Attempt was made to advance the catheter into the SVC over a guidewire. Fluoroscopic spot images were obtained. Access of the right internal jugular vein was then abandoned and hemostasis obtained with manual compression. Under ultrasound guidance, a 21 gauge needle was advanced into the right common femoral vein. Ultrasound image documentation was performed. A venotomy incision was made in the right groin region. After securing guidewire access, a 5 French catheter was advanced over a guidewire. Guidewire advancement was performed into the inferior vena cava. A wire was kinked to measure appropriate tunneled dialysis catheter length. A Palindrome tunneled hemodialysis catheter measuring 28 cm from tip to cuff was chosen for placement. This was tunneled in a retrograde fashion from the right thigh to the venotomy incision. At the venotomy, serial dilatation was performed and a 16 Fr peel-away sheath was placed over a guidewire. The catheter was then placed through the sheath and  the sheath removed. Final catheter positioning was confirmed and documented with a fluoroscopic spot image. The catheter was aspirated, flushed with saline, and injected with appropriate volume heparin dwells. The venotomy incision was closed with subcuticular 4-0 Vicryl. Dermabond was applied to the incision. The catheter exit site was secured with a Prolene retention suture. COMPLICATIONS: None. FINDINGS: Initial ultrasound demonstrates a patent right internal jugular vein. There is no patent left internal jugular vein present with multiple small collateral veins present in the left neck. The right common femoral vein is normally patent in  the right groin. Initial access of the right internal jugular vein was performed. A guidewire would not advance beyond the confluence of the brachiocephalic veins at the origin of the upper SVC. 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 Confirmation of venous occlusion was performed with a catheter and guidewire which could not be advanced below the level of chronic venous occlusion. The patient is not a candidate for future tunnel catheter placements in the chest due to chronic IVC occlusion. The right common femoral vein was accessed. A guidewire initially advanced through the common femoral and external iliac veins easily and met some resistance at the confluence of the common iliac veins and expected lower IVC. A 5 French catheter was able to be advanced over a guidewire to about the level of the mid IVC but there was further resistance to guidewire and catheter passage at this level likely on the basis of chronic stenosis of the IVC. This is also near the location of previous translumbar IVC access for catheter placement. After right femoral tunneled catheter placement, the tip lies in the lower IVC. The catheter aspirates well and is ready for immediate use. Flow rates may be somewhat limited given probable IVC stenosis above the level of the tip of the dialysis catheter. However, this is the best access possible currently for a tunneled catheter and other access sites will be quite limited given findings today and prior history. IMPRESSION: 1. Ultrasound-guided venous access of the right basilic vein in the upper arm performed for IV antibiotic administration and IV conscious sedation during dialysis catheter placement. 2. 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 The patient is not a candidate for future catheter placement in the chest given chronic SVC occlusion. 3. Patent right common femoral vein allowing placement of a tunneled dialysis catheter. This catheter was advanced to the level of the lower IVC and measures 28 cm from tip to cuff. A longer catheter was not able to be placed given catheter and guidewire evidence of significant mid IVC stenosis or occlusion based on restricted ability to advance a catheter and guidewire. Electronically Signed   By: Aletta Edouard M.D.   On: 07/11/2019 16:49    Labs: BMET Recent Labs  Lab 07/07/19 1033 07/08/19 0350 07/09/19 0312 07/10/19 0415 07/11/19 0422 07/12/19 0326  NA 137 137 139 137 137 139  K 4.6 5.0 5.3* 4.7 5.0 4.4  CL 105 106 105 106 101 104  CO2 20* 20* 19* 19* 22 22  GLUCOSE 84 82 80 80 88 87  BUN 80* 82* 84*  85* 87* 50*  CREATININE 5.82* 5.61* 5.33* 5.56* 5.86* 4.40*  CALCIUM 9.1 9.1 9.3 9.1 9.4 8.9  PHOS  --   --  6.0* 5.9*  --   --    CBC Recent Labs  Lab 07/09/19 1037 07/10/19 0415 07/11/19 0422 07/12/19 0326  WBC 1.5* 1.3* 1.7* 1.8*  NEUTROABS 1.0* 1.0* 1.1* 1.0*  HGB 8.1* 7.9* 7.7* 8.3*  HCT 25.5* 24.2* 24.0* 25.6*  MCV 103.7* 102.1* 103.4* 103.2*  PLT 125* 115* 133* 134*    Medications:    . aspirin EC  81 mg Oral Daily  . Chlorhexidine Gluconate Cloth  6 each Topical Daily  . [START ON 07/15/2019] colchicine  0.6 mg Oral Q72H  . darbepoetin (ARANESP) injection - NON-DIALYSIS  100 mcg Subcutaneous Q Mon-1800  . famotidine  20 mg Oral Daily  . gabapentin  100 mg Oral BID  . heparin  5,000 Units Subcutaneous Q8H  . latanoprost  1 drop Left Eye QHS  . midodrine  10 mg Oral TID WC  . pneumococcal 23 valent vaccine  0.5 mL Intramuscular Tomorrow-1000  . predniSONE  40 mg Oral  Daily  . sevelamer carbonate  800 mg Oral TID WC  . tacrolimus  2 mg Oral QHS  . tacrolimus  3 mg Oral q morning - 10a  . Vitamin D (Ergocalciferol)  50,000 Units Oral Q Glennie Hawk, MD 07/13/2019, 6:54 AM

## 2019-07-13 NOTE — Progress Notes (Signed)
Patient has been accepted at [facility] on a TTS schedule with a second shift seat time. Nephrologist/Dr. Posey Pronto updated. Clinic Manager requests that patient arrive at 11:15am on her first day at the clinic in order to sign consent forms prior to her first treatment. She will be given an exact seat time on her first day. Renal Navigator met with patient at bedside to discuss OP HD clinic information. Navigator notes that patient has been moved to a higher level of care unit. Patient appeared very groggy, but opened her eyes when Navigator knocked on the door and she welcomed the visit. She states that she is feeling fine and she states she was moved to a different unit because of "they are checking something with my heart." Navigator offered support. Navigator and patient agree that she is not ready for discharge yet, but that when she is, her OP HD clinic will be ready to treat her (starting Tuesday 07/18/19 or after). Navigator provided clinic information verbally and in writing, however, assumes that patient may not recall the conversation after today given how groggy she appeared.  Navigator will follow for discharge planning and follow up with patient closer to discharge to assist with a smooth transition from hospital to OP HD clinic start.  Alphonzo Cruise, [place] [facility] Navigator [phone]

## 2019-07-14 ENCOUNTER — Ambulatory Visit: Payer: Medicaid Other | Admitting: Family Medicine

## 2019-07-14 LAB — BASIC METABOLIC PANEL
Anion gap: 12 (ref 5–15)
BUN: 19 mg/dL (ref 6–20)
CO2: 26 mmol/L (ref 22–32)
Calcium: 8.6 mg/dL — ABNORMAL LOW (ref 8.9–10.3)
Chloride: 101 mmol/L (ref 98–111)
Creatinine, Ser: 3.13 mg/dL — ABNORMAL HIGH (ref 0.44–1.00)
GFR calc Af Amer: 19 mL/min — ABNORMAL LOW (ref >60)
GFR calc non Af Amer: 16 mL/min — ABNORMAL LOW (ref >60)
Glucose, Bld: 85 mg/dL (ref 70–99)
Potassium: 4.1 mmol/L (ref 3.5–5.1)
Sodium: 139 mmol/L (ref 135–145)

## 2019-07-14 LAB — CBC
HCT: 26.5 % — ABNORMAL LOW (ref 36.0–46.0)
Hemoglobin: 8.4 g/dL — ABNORMAL LOW (ref 12.0–15.0)
MCH: 33.5 pg (ref 26.0–34.0)
MCHC: 31.7 g/dL (ref 30.0–36.0)
MCV: 105.6 fL — ABNORMAL HIGH (ref 80.0–100.0)
Platelets: 160 10*3/uL (ref 150–400)
RBC: 2.51 MIL/uL — ABNORMAL LOW (ref 3.87–5.11)
RDW: 25.3 % — ABNORMAL HIGH (ref 11.5–15.5)
WBC: 2.3 10*3/uL — ABNORMAL LOW (ref 4.0–10.5)
nRBC: 4.9 % — ABNORMAL HIGH (ref 0.0–0.2)

## 2019-07-14 MED ORDER — DARBEPOETIN ALFA 60 MCG/0.3ML IJ SOSY
60.0000 ug | PREFILLED_SYRINGE | INTRAMUSCULAR | Status: DC
  Administered 2019-07-15: 60 ug via INTRAVENOUS
  Filled 2019-07-14: qty 0.3

## 2019-07-14 MED ORDER — CHLORHEXIDINE GLUCONATE CLOTH 2 % EX PADS: 6.0000 | MEDICATED_PAD | Freq: Every day | CUTANEOUS | Status: DC

## 2019-07-14 MED ORDER — PREDNISONE 20 MG PO TABS
20.0000 mg | ORAL_TABLET | Freq: Every day | ORAL | Status: DC
  Administered 2019-07-15 – 2019-07-16 (×2): 20 mg via ORAL
  Filled 2019-07-14 (×3): qty 1

## 2019-07-14 NOTE — Progress Notes (Signed)
**Tina Tina** Tina Tina  Assessment/ Plan: Pt is a 54 y.o. yo female  with history of ESRD status post kidney transplant, CKD 4 followed by Dr. Alric Quan, TIA, recent prolonged hospitalization for Covid pneumonia when she developed AKI on CKD, presented with generalized weakness, decreased oral intake and worsening anemia, seen as a consultation for AKI on CKD.  #Acute kidney injury on CKD now progressed to ESRD: The serum creatinine level has been worsening since she had Covid infection in 05/2019.  Started dialysis via right femoral tunnel catheter placed by IR.  Vascular is following for placement of permanent access as outpatient.  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 She had dialysis yesterday.  Plan for next treatment tomorrow. Social worker following for outpatient HD arrangement.   #Kidney transplant status: Continue current immunosuppression including Prograf and prednisone.  Imuran on hold because of neutropenia.  #Anemia of CKD, symptomatic: Saturation 83%.    Continue Aranesp.  #Hypotension/volume: Continue midodrine.    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    #Pancytopenia: Continue to hold Imuran.  Seen by hematologist.  #Acute gout flare which was treated with steroid and colchicine.  Subjective: Seen and examined at bedside.  She had dialysis yesterday with 1.5 L UF, tolerated well.  No nausea vomiting chest pain shortness of breath.  Objective Vital signs in last 24 hours: Vitals:   07/13/19 2300 07/13/19 2340 07/14/19 0334 07/14/19 0727  BP: (!) 94/49 (!) 90/42 (!) 83/55 (!) 93/52  Pulse: (!) 52 61 60 61  Resp: 13 16 16 17   Temp: 98.1 F (36.7 C) 98 F (36.7 C) 98.2 F (36.8 C) 97.9 F (36.6 C)  TempSrc: Oral Oral Oral Oral  SpO2: 95% 95%  93%  Weight:      Height:       Weight change: -0.9 kg  Intake/Output Summary (Last 24 hours) at 07/14/2019 0821 Last data  filed at 07/13/2019 2159 Gross per 24 hour  Intake --  Output 1500 ml  Net -1500 ml       Labs: Basic Metabolic Panel: Recent Labs  Lab 07/09/19 0312 07/09/19 0312 07/10/19 0415 07/11/19 0422 07/12/19 0326  NA 139   < > 137 137 139  K 5.3*   < > 4.7 5.0 4.4  CL 105   < > 106 101 104  CO2 19*   < > 19* 22 22  GLUCOSE 80   < > 80 88 87  BUN 84*   < > 85* 87* 50*  CREATININE 5.33*   < > 5.56* 5.86* 4.40*  CALCIUM 9.3   < > 9.1 9.4 8.9  PHOS 6.0*  --  5.9*  --   --    < > = values in this interval not displayed.   Liver Function Tests: Recent Labs  Lab 07/10/19 0415 07/11/19 0422 07/12/19 0326  AST 24 21 24   ALT 11 13 12   ALKPHOS 116 112 111  BILITOT 1.5* 1.5* 1.8*  PROT 5.7* 6.0* 6.1*  ALBUMIN 3.5 3.6 3.6   Recent Labs  Lab 07/07/19 1033  LIPASE 44   No results for input(s): AMMONIA in the last 168 hours. CBC: Recent Labs  Lab 07/10/19 0415 07/10/19 0415 07/11/19 0422 07/11/19 0422 07/12/19 0326 07/13/19 0634 07/14/19 0313  WBC 1.3*   < > 1.7*   < > 1.8* 2.3* 2.3*  NEUTROABS 1.0*   < > 1.1*  --  1.0* 1.7  --  HGB 7.9*   < > 7.7*   < > 8.3* 9.0* 8.4*  HCT 24.2*   < > 24.0*   < > 25.6* 28.1* 26.5*  MCV 102.1*  --  103.4*  --  103.2* 104.1* 105.6*  PLT 115*   < > 133*   < > 134* 152 160   < > = values in this interval not displayed.   Cardiac Enzymes: No results for input(s): CKTOTAL, CKMB, CKMBINDEX, TROPONINI in the last 168 hours. CBG: No results for input(s): GLUCAP in the last 168 hours.  Iron Studies: No results for input(s): IRON, TIBC, TRANSFERRIN, FERRITIN in the last 72 hours. Studies/Results: No results found.  Medications: Infusions:   Scheduled Medications: . aspirin EC  81 mg Oral Daily  . Chlorhexidine Gluconate Cloth  6 each Topical Daily  . [START ON 07/15/2019] colchicine  0.6 mg Oral Q72H  . darbepoetin (ARANESP) injection - NON-DIALYSIS  100 mcg Subcutaneous Q Mon-1800  . famotidine  20 mg Oral Daily  . gabapentin  100  mg Oral BID  . heparin  5,000 Units Subcutaneous Q8H  . heparin sodium (porcine)      . latanoprost  1 drop Left Eye QHS  . midodrine  15 mg Oral TID WC  . pneumococcal 23 valent vaccine  0.5 mL Intramuscular Tomorrow-1000  . predniSONE  30 mg Oral Daily  . sevelamer carbonate  800 mg Oral TID WC  . tacrolimus  2 mg Oral QHS  . tacrolimus  3 mg Oral q morning - 10a  . Vitamin D (Ergocalciferol)  50,000 Units Oral Q Sun    have reviewed scheduled and prn medications.  Physical Exam: General:NAD, lying in bed comfortable Heart:RRR, s1s2 nl, no rubs Lungs:clear b/l, no crackle Abdomen:soft, Non-tender, non-distended Extremities: Edema improved. Neurology: Alert awake and following commands, no asterixis Vascular access: Right femoral TDC.  Sudiksha Victor Tanna Furry 07/14/2019,8:21 AM  LOS: 7 days  Pager: [phone]

## 2019-07-14 NOTE — Progress Notes (Signed)
OP HD clinic requesting clarity in documentation regarding "AKI vs likely ESRD." Navigator faxed Renal progress note from today, which states "ESRD."  Alphonzo Cruise, [place] Renal Navigator [phone]

## 2019-07-14 NOTE — Progress Notes (Addendum)
[facility] PROGRESS NOTE    Progress Note  Tina Watkins  KGM:[id number] DOB: [birth year] DOA: 07/07/2019 PCP: [name], FNP   Brief Narrative:   Tina Watkins is an 54 y.o. female end-stage renal disease status post renal transplant in 2005, chronic kidney disease stage IV on chronic steroids and immunosuppressive therapy, chronic anemia in the setting of chronic renal disease and immunosuppression from chemotherapy with intermittent blood transfusions and regular EPO injections by hematology, TIA, CHF, moderate pulmonary hypertension presented with increased fatigue and low hemoglobin.  She was also noted to be in acute kidney injury. 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 Further management per nephrology.  Chronic hypotension Asymptomatic blood pressure is on the low side at baseline -Continue midodrine  Pancytopenia/neutropenia Multifactorial in the setting of Imuran hematology  was consulted and recommended checking B12, copper, HIV which are negative -Imuran held  chronic diastolic heart failure: -Volume managed with HD now -Last echo 01/2019 with restrictive cardiomyopathy, grade 3 diastolic dysfunction, preserved EF, moderate pulmonary hypertension   DVT prophylaxis: Lovenox Family Communication:none Status is: Inpatient  Remains inpatient appropriate because:Hemodynamically unstable, newly started on HD transfer out of stepdown   Dispo: The patient is from: Home              Anticipated d/c is to: Home              Anticipated d/c date is: 2 to 3 days              Patient currently is not medically stable to d/c.   Code Status:     Code Status Orders  (From admission, onward)         Start     Ordered   07/07/19 1615  Full code  Continuous     07/07/19 1616        Code Status History    Date Active Date Inactive Code Status Order ID Comments User Context   05/09/2019 0706 05/18/2019 2040 Full Code [id number]  Shela Leff, MD ED   03/20/2019 1507 03/24/2019 2243 Full Code [id number]  Lequita Halt, MD Inpatient   11/20/2017 0236 11/20/2017 1916 Full Code [id number]  Ivor Costa, MD ED   08/09/2017 2312 08/17/2017 1950 Full Code [id number]  Kathrene Alu, MD Inpatient   09/29/2015 2318 10/03/2015 1841 Full Code [id number]  Reubin Milan, MD Inpatient   09/29/2015 2318 09/29/2015 2318 Full Code [id number]  Reubin Milan, MD Inpatient   01/08/2015 1637 01/10/2015 1918 Full Code [id number]  Leeroy Cha, MD Inpatient   06/13/2014 1008 06/16/2014 1800 Full Code [id number]  Orson Eva, MD Inpatient  04/10/2012 0109 04/11/2012 2008 Full Code [id number]  Derrill Kay, MD Inpatient   12/10/2011 2204 12/12/2011 1423 Full Code [id number]  Neomia Glass, RN Inpatient   05/22/2011 1520 05/30/2011 1553 Full Code [id number]  Minor, Grace Bushy, NP ED   Advance Care Planning Activity        IV Access:    Peripheral IV   Procedures and diagnostic  studies:   No results found.   Medical Consultants:    None.  Anti-Infectives:   none  Subjective:    Tina Watkins reports improvement in her gout pain and swelling  Objective:    Vitals:   07/13/19 2300 07/13/19 2340 07/14/19 0334 07/14/19 0727  BP: (!) 94/49 (!) 90/42 (!) 83/55 (!) 93/52  Pulse: (!) 52 61 60 61  Resp: 13 16 16 17   Temp: 98.1 F (36.7 C) 98 F (36.7 C) 98.2 F (36.8 C) 97.9 F (36.6 C)  TempSrc: Oral Oral Oral Oral  SpO2: 95% 95%  93%  Weight:      Height:       SpO2: 93 % O2 Flow Rate (L/min): 2 L/min   Intake/Output Summary (Last 24 hours) at 07/14/2019 1115 Last data filed at 07/14/2019 0900 Gross per 24 hour  Intake 240 ml  Output 1500 ml  Net -1260 ml   Filed Weights   07/12/19 1617 07/13/19 1821 07/13/19 2159  Weight: 59 kg 59.9 kg 57.4 kg    Exam: General exam: Chronically ill female sitting up in bed, AAOx3, no distress HEENT: No JVD CVS: S1-S2, regular rate rhythm Lungs: Few basilar rales, otherwise clear Abdomen: Soft, nontender, bowel sounds present Extremities: Trace edema, swelling and tenderness of the right ankle and first MTP joint are improving Skin: No rashes on exposed skin Psychiatry: Flat affect  Data Reviewed:    Labs: Basic Metabolic Panel: Recent Labs  Lab 07/08/19 0350 07/08/19 0350 07/09/19 0312 07/09/19 0312 07/10/19 0415 07/10/19 0415 07/11/19 0422 07/12/19 0326  NA 137  --  139  --  137  --  137 139  K 5.0   < > 5.3*   < > 4.7   < > 5.0 4.4  CL 106  --  105  --  106  --  101 104  CO2 20*  --  19*  --  19*  --  22 22  GLUCOSE 82  --  80  --  80  --  88 87  BUN 82*  --  84*  --  85*  --  87* 50*  CREATININE 5.61*  --  5.33*  --  5.56*  --  5.86* 4.40*  CALCIUM 9.1  --  9.3  --  9.1  --  9.4 8.9  PHOS  --   --  6.0*  --  5.9*  --   --   --    < > = values in this interval not displayed.   GFR Estimated Creatinine Clearance: 11.7 mL/min (A) (by C-G formula based on SCr of 4.4  mg/dL (H)). Liver Function Tests: Recent Labs  Lab 07/09/19 0312 07/10/19 0415 07/11/19 0422 07/12/19 0326  AST  --  24 21 24   ALT  --  11 13 12   ALKPHOS  --  116 112 111  BILITOT  --  1.5* 1.5* 1.8*  PROT  --  5.7* 6.0* 6.1*  ALBUMIN 3.4* 3.5 3.6 3.6   No results for input(s): LIPASE, AMYLASE in the last 168 hours. No results for input(s):  AMMONIA in the last 168 hours. Coagulation profile Recent Labs  Lab 07/11/19 0945  INR 1.1   COVID-19 Labs  No results for input(s): DDIMER, FERRITIN, LDH, CRP in the last 72 hours.  Lab Results  Component Value Date   SARSCOV2NAA POSITIVE (A) 05/09/2019   [place] NEGATIVE 03/17/2019    CBC: Recent Labs  Lab 07/09/19 1037 07/09/19 1037 07/10/19 0415 07/11/19 0422 07/12/19 0326 07/13/19 0634 07/14/19 0313  WBC 1.5*   < > 1.3* 1.7* 1.8* 2.3* 2.3*  NEUTROABS 1.0*  --  1.0* 1.1* 1.0* 1.7  --   HGB 8.1*   < > 7.9* 7.7* 8.3* 9.0* 8.4*  HCT 25.5*   < > 24.2* 24.0* 25.6* 28.1* 26.5*  MCV 103.7*   < > 102.1* 103.4* 103.2* 104.1* 105.6*  PLT 125*   < > 115* 133* 134* 152 160   < > = values in this interval not displayed.   Cardiac Enzymes: No results for input(s): CKTOTAL, CKMB, CKMBINDEX, TROPONINI in the last 168 hours. BNP (last 3 results) No results for input(s): PROBNP in the last 8760 hours. CBG: No results for input(s): GLUCAP in the last 168 hours. D-Dimer: No results for input(s): DDIMER in the last 72 hours. Hgb A1c: No results for input(s): HGBA1C in the last 72 hours. Lipid Profile: No results for input(s): CHOL, HDL, LDLCALC, TRIG, CHOLHDL, LDLDIRECT in the last 72 hours. Thyroid function studies: No results for input(s): TSH, T4TOTAL, T3FREE, THYROIDAB in the last 72 hours.  Invalid input(s): FREET3 Anemia work up: No results for input(s): VITAMINB12, FOLATE, FERRITIN, TIBC, IRON, RETICCTPCT in the last 72 hours. Sepsis Labs: Recent Labs  Lab 07/11/19 0422 07/12/19 0326 07/13/19 0634 07/14/19 0313    WBC 1.7* 1.8* 2.3* 2.3*   Microbiology No results found for this or any previous visit (from the past 240 hour(s)).   Medications:   . aspirin EC  81 mg Oral Daily  . Chlorhexidine Gluconate Cloth  6 each Topical Daily  . [START ON 07/15/2019] colchicine  0.6 mg Oral Q72H  . [START ON 07/15/2019] darbepoetin (ARANESP) injection - DIALYSIS  60 mcg Intravenous Q Sat-HD  . famotidine  20 mg Oral Daily  . gabapentin  100 mg Oral BID  . heparin  5,000 Units Subcutaneous Q8H  . latanoprost  1 drop Left Eye QHS  . midodrine  15 mg Oral TID WC  . pneumococcal 23 valent vaccine  0.5 mL Intramuscular Tomorrow-1000  . predniSONE  30 mg Oral Daily  . sevelamer carbonate  800 mg Oral TID WC  . tacrolimus  2 mg Oral QHS  . tacrolimus  3 mg Oral q morning - 10a  . Vitamin D (Ergocalciferol)  50,000 Units Oral Q Sun   Continuous Infusions:   LOS: 7 days   Domenic Polite  [facility]  07/14/2019, 11:15 AM

## 2019-07-14 NOTE — Progress Notes (Signed)
Patient admitted to room 5M17 from [facility].[name] Patient is alert and oriented. Limited movement to R leg due to gout. R eye blindness. Pt is oriented to room and staff. Will continue to monitor. Dorthey Sawyer, RN

## 2019-07-14 NOTE — Progress Notes (Signed)
54 year old female that vascular was initially consulted for new dialysis access evaluation with acute on chronic kidney disease in setting of failing transplant.  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  In reviewing the old notes she has had multiple IVC interventions by our IR as far back as 2005.  Discussed with nephrology Dr. Posey Pronto that would likely need CT venogram to sort out if there is any options for lower extremity access like a femoral loop graft.  He wants to delay this for 4 weeks to see how her renal function recovers.  May not have any options from our standpoint and would be catheter dependent vs needing translumbar catheter if IR can place.  Will arrange follow-up in our office in about a month.  She will be catheter dependent in the interim.  Marty Heck, MD [facility] [place] Office: [place]

## 2019-07-14 NOTE — Evaluation (Signed)
Physical Therapy Evaluation Patient Details Name: Tina Watkins MRN: [id number] DOB: 04-14-1965 Today's Date: 07/14/2019   History of Present Illness  Pt 54 y.o. female admitted on 07/07/19 due nonoliguric AKI onc CKD IV versus progressive CKD stage IV. PMH R eye blind stroke, stenosis, seizures, CHF, fibromyaglia , anemia, afib.  Clinical Impression  Pt presents with an overall decrease in functional mobility, increased pain and decreased balance secondary to above. PTA, pt lives alone with family nearby to help. Today, pt able to completed bed mobility mod(I), transfer supervision and complete amb min guard. Pt able to complete pericare with one UE supported in stance during session. Pt would benefit from continued acute PT services to maximize functional mobility and independence prior to d/c to next venue of care.    Follow Up Recommendations No PT follow up    Equipment Recommendations  None recommended by PT    Recommendations for Other Services       Precautions / Restrictions Precautions Precautions: Fall      Mobility  Bed Mobility Overal bed mobility: Modified Independent                Transfers Overall transfer level: Needs assistance Equipment used: Rolling walker (2 wheeled) Transfers: Sit to/from Stand Sit to Stand: Supervision         General transfer comment: Pt required supervision assistance with RW and min guard without RW. Pt transfered from bed to chair without RW, from bathroom to/from chair with rolling walker and greater stability  Ambulation/Gait Ambulation/Gait assistance: Min guard Gait Distance (Feet): 30 Feet Assistive device: Rolling walker (2 wheeled) Gait Pattern/deviations: Step-to pattern;Decreased step length - right;Decreased step length - left;Decreased stride length;Wide base of support;Trunk flexed Gait velocity: decreased   General Gait Details: Pt required inital min guard assistance with amb for safety due to  decreased stride length with painful BLE. Pt had one rest break at 48ft to use rest room  Stairs            Wheelchair Mobility    Modified Rankin (Stroke Patients Only)       Balance Overall balance assessment: Needs assistance   Sitting balance-Leahy Scale: Normal       Standing balance-Leahy Scale: Good Standing balance comment: Pt able to perform pericare standing with one UE supported on walker in stance supervision. Pt able to sit to stand with walker from toliet supervision                             Pertinent Vitals/Pain Pain Assessment: Faces Faces Pain Scale: Hurts even more Pain Location: Bilateral LE Pain Descriptors / Indicators: Pounding;Pressure;Squeezing Pain Intervention(s): Limited activity within patient's tolerance;Monitored during session;Repositioned;Patient requesting pain meds-RN notified    Home Living Family/patient expects to be discharged to:: Private residence Living Arrangements: Alone Available Help at Discharge: Family;Available PRN/intermittently Type of Home: Apartment Home Access: Stairs to enter Entrance Stairs-Rails: Can reach both Entrance Stairs-Number of Steps: 6 Home Layout: One level Home Equipment: Walker - 2 wheels;Cane - single point Additional Comments: Reports uncle can assist when she needs    Prior Function Level of Independence: Independent with assistive device(s)         Comments: Pt reports she uses cane all the time     Hand Dominance        Extremity/Trunk Assessment   Upper Extremity Assessment Upper Extremity Assessment: Overall WFL for tasks assessed    Lower Extremity  Assessment Lower Extremity Assessment: Overall WFL for tasks assessed    Cervical / Trunk Assessment Cervical / Trunk Assessment: Kyphotic  Communication   Communication: No difficulties  Cognition Arousal/Alertness: Awake/alert Behavior During Therapy: WFL for tasks assessed/performed Overall Cognitive  Status: No family/caregiver present to determine baseline cognitive functioning Area of Impairment: Attention;Memory;Following commands;Safety/judgement;Awareness;Problem solving                   Current Attention Level: Focused   Following Commands: Follows multi-step commands inconsistently;Follows one step commands inconsistently Safety/Judgement: Decreased awareness of safety   Problem Solving: Requires verbal cues;Requires tactile cues General Comments: Pt required verbal cues to stay on task and was emotionally labile during session. Pt indecisive with tasks initally requesting to get up to the chair for lunch, then deciding to go to the bathroom, then requesting to wash self at sink. At end of session pt asked where PT was going, after already explaing the frequency of PT care.      General Comments General comments (skin integrity, edema, etc.): Skin integument appeared WNL, dried blood on LLE wound dressing. Nursing aware of session and pt requests for pain meds. Nursing stated she would check on pt after session.    Exercises     Assessment/Plan    PT Assessment Patient needs continued PT services  PT Problem List Decreased safety awareness;Decreased mobility;Decreased activity tolerance;Decreased balance;Decreased knowledge of use of DME;Pain       PT Treatment Interventions DME instruction;Therapeutic activities;Cognitive remediation;Gait training;Therapeutic exercise;Patient/family education;Stair training;Balance training;Functional mobility training    PT Goals (Current goals can be found in the Care Plan section)  Acute Rehab PT Goals Patient Stated Goal: home, "go to [place]" PT Goal Formulation: With patient Time For Goal Achievement: 07/28/19 Potential to Achieve Goals: Good    Frequency Min 3X/week   Barriers to discharge   no barriers to d/c    Co-evaluation               AM-PAC PT "6 Clicks" Mobility  Outcome Measure Help needed turning  from your back to your side while in a flat bed without using bedrails?: None Help needed moving from lying on your back to sitting on the side of a flat bed without using bedrails?: None Help needed moving to and from a bed to a chair (including a wheelchair)?: A Little Help needed standing up from a chair using your arms (e.g., wheelchair or bedside chair)?: None Help needed to walk in hospital room?: A Little Help needed climbing 3-5 steps with a railing? : A Little 6 Click Score: 21    End of Session Equipment Utilized During Treatment: Gait belt Activity Tolerance: Patient limited by pain Patient left: in chair;with call bell/phone within reach Nurse Communication: Mobility status;Other (comment) (pt need for medication) PT Visit Diagnosis: Unsteadiness on feet (R26.81);Pain Pain - part of body: Leg    Time: 5093-2671 PT Time Calculation (min) (ACUTE ONLY): 38 min   Charges:   PT Evaluation $PT Eval Moderate Complexity: 1 Mod PT Treatments $Therapeutic Activity: 23-37 mins        [employer] SPT 07/14/2019   Rolland Porter 07/14/2019, 1:38 PM

## 2019-07-15 LAB — CBC
HCT: 26.5 % — ABNORMAL LOW (ref 36.0–46.0)
Hemoglobin: 8.7 g/dL — ABNORMAL LOW (ref 12.0–15.0)
MCH: 34.7 pg — ABNORMAL HIGH (ref 26.0–34.0)
MCHC: 32.8 g/dL (ref 30.0–36.0)
MCV: 105.6 fL — ABNORMAL HIGH (ref 80.0–100.0)
Platelets: 125 10*3/uL — ABNORMAL LOW (ref 150–400)
RBC: 2.51 MIL/uL — ABNORMAL LOW (ref 3.87–5.11)
RDW: 25.7 % — ABNORMAL HIGH (ref 11.5–15.5)
WBC: 1.8 10*3/uL — ABNORMAL LOW (ref 4.0–10.5)
nRBC: 5.6 % — ABNORMAL HIGH (ref 0.0–0.2)

## 2019-07-15 LAB — BASIC METABOLIC PANEL
Anion gap: 14 (ref 5–15)
BUN: 28 mg/dL — ABNORMAL HIGH (ref 6–20)
CO2: 24 mmol/L (ref 22–32)
Calcium: 8.7 mg/dL — ABNORMAL LOW (ref 8.9–10.3)
Chloride: 100 mmol/L (ref 98–111)
Creatinine, Ser: 4.69 mg/dL — ABNORMAL HIGH (ref 0.44–1.00)
GFR calc Af Amer: 12 mL/min — ABNORMAL LOW (ref >60)
GFR calc non Af Amer: 10 mL/min — ABNORMAL LOW (ref >60)
Glucose, Bld: 87 mg/dL (ref 70–99)
Potassium: 3.9 mmol/L (ref 3.5–5.1)
Sodium: 138 mmol/L (ref 135–145)

## 2019-07-15 MED ORDER — LIDOCAINE HCL (PF) 1 % IJ SOLN: 5.0000 mL | INTRAMUSCULAR | Status: DC | PRN

## 2019-07-15 MED ORDER — OXYCODONE HCL 5 MG PO TABS
ORAL_TABLET | ORAL | Status: AC
  Filled 2019-07-15: qty 2

## 2019-07-15 MED ORDER — MIDODRINE HCL 5 MG PO TABS
15.0000 mg | ORAL_TABLET | Freq: Two times a day (BID) | ORAL | Status: DC
  Administered 2019-07-15 – 2019-07-17 (×4): 15 mg via ORAL
  Filled 2019-07-15 (×4): qty 3

## 2019-07-15 MED ORDER — PANTOPRAZOLE SODIUM 40 MG PO TBEC
40.0000 mg | DELAYED_RELEASE_TABLET | Freq: Every day | ORAL | Status: DC
  Administered 2019-07-15 – 2019-07-17 (×3): 40 mg via ORAL
  Filled 2019-07-15 (×4): qty 1

## 2019-07-15 MED ORDER — SODIUM CHLORIDE 0.9 % IV SOLN: 100.0000 mL | INTRAVENOUS | Status: DC | PRN

## 2019-07-15 MED ORDER — HEPARIN SODIUM (PORCINE) 1000 UNIT/ML DIALYSIS
20.0000 [IU]/kg | INTRAMUSCULAR | Status: DC | PRN
  Administered 2019-07-15: 1100 [IU] via INTRAVENOUS_CENTRAL

## 2019-07-15 MED ORDER — PENTAFLUOROPROP-TETRAFLUOROETH EX AERO: 1.0000 "application " | INHALATION_SPRAY | CUTANEOUS | Status: DC | PRN

## 2019-07-15 MED ORDER — HEPARIN SODIUM (PORCINE) 1000 UNIT/ML IJ SOLN
INTRAMUSCULAR | Status: AC
  Filled 2019-07-15: qty 4

## 2019-07-15 MED ORDER — ALTEPLASE 2 MG IJ SOLR: 2.0000 mg | Freq: Once | INTRAMUSCULAR | Status: DC | PRN

## 2019-07-15 MED ORDER — HEPARIN SODIUM (PORCINE) 1000 UNIT/ML DIALYSIS: 1000.0000 [IU] | INTRAMUSCULAR | Status: DC | PRN

## 2019-07-15 MED ORDER — DARBEPOETIN ALFA 60 MCG/0.3ML IJ SOSY
PREFILLED_SYRINGE | INTRAMUSCULAR | Status: AC
  Filled 2019-07-15: qty 0.3

## 2019-07-15 MED ORDER — LIDOCAINE-PRILOCAINE 2.5-2.5 % EX CREA: 1.0000 "application " | TOPICAL_CREAM | CUTANEOUS | Status: DC | PRN

## 2019-07-15 MED ORDER — MIDODRINE HCL 5 MG PO TABS
ORAL_TABLET | ORAL | Status: AC
  Filled 2019-07-15: qty 3

## 2019-07-15 NOTE — Progress Notes (Signed)
[facility] PROGRESS NOTE    Progress Note  Tina Watkins  OTL:[id number] DOB: [birth year] DOA: 07/07/2019 PCP: [name], FNP   Brief Narrative:   Tina Watkins is an 54 y.o. female end-stage renal disease status post renal transplant in 2005, chronic kidney disease stage IV on chronic steroids and immunosuppressive therapy, chronic anemia in the setting of chronic renal disease and immunosuppression from chemotherapy with intermittent blood transfusions and regular EPO injections by hematology, TIA, CHF, moderate pulmonary hypertension presented with increased fatigue and low hemoglobin.  She was also noted to be in acute kidney injury. 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 Further management per nephrology.  Chronic hypotension Asymptomatic blood pressure is on the low side at baseline -Continue midodrine  Pancytopenia/neutropenia Multifactorial in the setting of Imuran  hematology was consulted and recommended checking B12, copper, HIV which are negative -Imuran held  chronic diastolic heart failure: -Volume managed with HD now -Last echo 01/2019 with restrictive cardiomyopathy, grade 3 diastolic dysfunction, preserved EF, moderate pulmonary hypertension   DVT prophylaxis: Lovenox Family Communication:none Status is: Inpatient  Remains inpatient appropriate because: awaiting CLIP for HD   Dispo: The patient is from: Home              Anticipated d/c is to: Home              Anticipated d/c date is: when clipped for HD              Patient currently is medically stable to d/c.   Code Status:     Code Status Orders  (From admission, onward)         Start     Ordered   07/07/19 1615  Full code  Continuous     07/07/19 1616        Code Status History    Date Active Date Inactive Code Status Order ID Comments User Context   05/09/2019 0706 05/18/2019 2040 Full Code [id number]  Shela Leff, MD ED   03/20/2019 1507 03/24/2019 2243 Full Code [id number]  Lequita Halt, MD Inpatient   11/20/2017 0236 11/20/2017 1916 Full Code [id number]  Ivor Costa, MD ED   08/09/2017 2312 08/17/2017 1950 Full Code [id number]  Kathrene Alu, MD Inpatient   09/29/2015 2318 10/03/2015 1841 Full Code [id number]  Reubin Milan, MD Inpatient   09/29/2015 2318 09/29/2015 2318 Full Code [id number]  Reubin Milan, MD Inpatient   01/08/2015 1637 01/10/2015 1918 Full Code [id number]  Leeroy Cha, MD Inpatient   06/13/2014 1008 06/16/2014 1800 Full Code [id number]  Orson Eva, MD Inpatient   04/10/2012 0109 04/11/2012 2008 Full  Code [id number]  Derrill Kay, MD Inpatient   12/10/2011 2204 12/12/2011 1423 Full Code [id number]  Neomia Glass, RN Inpatient   05/22/2011 1520 05/30/2011 1553 Full Code [id number]  Minor, Grace Bushy, NP ED   Advance Care Planning Activity        IV Access:    Peripheral IV   Procedures and diagnostic studies:   No results  found.   Medical Consultants:    None.  Anti-Infectives:   none  Subjective:    Tina Watkins is feeling okay, pain and swelling in her right ankle, big toe is improving  Objective:    Vitals:   07/15/19 1100 07/15/19 1130 07/15/19 1200 07/15/19 1215  BP: (!) 95/51 (!) 103/49 (!) 99/56 (!) 102/54  Pulse: (!) 51 (!) 52 (!) 54 (!) 52  Resp:    17  Temp:    98.2 F (36.8 C)  TempSrc:    Oral  SpO2:    95%  Weight:    57.8 kg  Height:       SpO2: 95 % O2 Flow Rate (L/min): 2 L/min   Intake/Output Summary (Last 24 hours) at 07/15/2019 1339 Last data filed at 07/15/2019 1215 Gross per 24 hour  Intake 120 ml  Output 1800 ml  Net -1680 ml   Filed Weights   07/14/19 1808 07/15/19 0840 07/15/19 1215  Weight: 57.3 kg 59.6 kg 57.8 kg    Exam: General exam: Chronically ill female, sitting up in bed, AAOx3, no distress HEENT: No JVD CVS: S1-S2, regular rate rhythm Lungs: C few basilar rales, otherwise clear Abdomen: Soft, nontender, bowel sounds present Extremities: Trace edema, swelling and tenderness of the right ankle and first MTP joints are improving  Skin: No rashes on exposed skin Psychiatry: Flat affect  Data Reviewed:    Labs: Basic Metabolic Panel: Recent Labs  Lab 07/09/19 0312 07/09/19 0312 07/10/19 0415 07/10/19 0415 07/11/19 0422 07/11/19 0422 07/12/19 0326 07/12/19 0326 07/14/19 0313 07/15/19 0437  NA 139   < > 137  --  137  --  139  --  139 138  K 5.3*   < > 4.7   < > 5.0   < > 4.4   < > 4.1 3.9  CL 105   < > 106  --  101  --  104  --  101 100  CO2 19*   < > 19*  --  22  --  22  --  26 24  GLUCOSE 80   < > 80  --  88  --  87  --  85 87  BUN 84*   < > 85*  --  87*  --  50*  --  19 28*  CREATININE 5.33*   < > 5.56*  --  5.86*  --  4.40*  --  3.13* 4.69*  CALCIUM 9.3   < > 9.1  --  9.4  --  8.9  --  8.6* 8.7*  PHOS 6.0*  --  5.9*  --   --   --   --   --   --   --    < > = values in this interval not displayed.    GFR Estimated Creatinine Clearance: 11 mL/min (A) (by C-G formula based on SCr of 4.69 mg/dL (H)). Liver Function Tests: Recent Labs  Lab 07/09/19 0312 07/10/19 0415 07/11/19 0422 07/12/19 0326  AST  --  24 21 24   ALT  --  11 13  12  ALKPHOS  --  116 112 111  BILITOT  --  1.5* 1.5* 1.8*  PROT  --  5.7* 6.0* 6.1*  ALBUMIN 3.4* 3.5 3.6 3.6   No results for input(s): LIPASE, AMYLASE in the last 168 hours. No results for input(s): AMMONIA in the last 168 hours. Coagulation profile Recent Labs  Lab 07/11/19 0945  INR 1.1   COVID-19 Labs  No results for input(s): DDIMER, FERRITIN, LDH, CRP in the last 72 hours.  Lab Results  Component Value Date   SARSCOV2NAA POSITIVE (A) 05/09/2019   [place] NEGATIVE 03/17/2019    CBC: Recent Labs  Lab 07/09/19 1037 07/09/19 1037 07/10/19 0415 07/10/19 0415 07/11/19 0422 07/12/19 0326 07/13/19 0634 07/14/19 0313 07/15/19 0437  WBC 1.5*   < > 1.3*   < > 1.7* 1.8* 2.3* 2.3* 1.8*  NEUTROABS 1.0*  --  1.0*  --  1.1* 1.0* 1.7  --   --   HGB 8.1*   < > 7.9*   < > 7.7* 8.3* 9.0* 8.4* 8.7*  HCT 25.5*   < > 24.2*   < > 24.0* 25.6* 28.1* 26.5* 26.5*  MCV 103.7*   < > 102.1*   < > 103.4* 103.2* 104.1* 105.6* 105.6*  PLT 125*   < > 115*   < > 133* 134* 152 160 125*   < > = values in this interval not displayed.   Cardiac Enzymes: No results for input(s): CKTOTAL, CKMB, CKMBINDEX, TROPONINI in the last 168 hours. BNP (last 3 results) No results for input(s): PROBNP in the last 8760 hours. CBG: No results for input(s): GLUCAP in the last 168 hours. D-Dimer: No results for input(s): DDIMER in the last 72 hours. Hgb A1c: No results for input(s): HGBA1C in the last 72 hours. Lipid Profile: No results for input(s): CHOL, HDL, LDLCALC, TRIG, CHOLHDL, LDLDIRECT in the last 72 hours. Thyroid function studies: No results for input(s): TSH, T4TOTAL, T3FREE, THYROIDAB in the last 72 hours.  Invalid input(s): FREET3 Anemia work up: No  results for input(s): VITAMINB12, FOLATE, FERRITIN, TIBC, IRON, RETICCTPCT in the last 72 hours. Sepsis Labs: Recent Labs  Lab 07/12/19 0326 07/13/19 0634 07/14/19 0313 07/15/19 0437  WBC 1.8* 2.3* 2.3* 1.8*   Microbiology No results found for this or any previous visit (from the past 240 hour(s)).   Medications:   . aspirin EC  81 mg Oral Daily  . Chlorhexidine Gluconate Cloth  6 each Topical Daily  . colchicine  0.6 mg Oral Q72H  . darbepoetin (ARANESP) injection - DIALYSIS  60 mcg Intravenous Q Sat-HD  . famotidine  20 mg Oral Daily  . gabapentin  100 mg Oral BID  . heparin  5,000 Units Subcutaneous Q8H  . heparin sodium (porcine)      . latanoprost  1 drop Left Eye QHS  . midodrine  15 mg Oral BID WC  . pantoprazole  40 mg Oral Q1200  . pneumococcal 23 valent vaccine  0.5 mL Intramuscular Tomorrow-1000  . predniSONE  20 mg Oral Daily  . sevelamer carbonate  800 mg Oral TID WC  . tacrolimus  2 mg Oral QHS  . tacrolimus  3 mg Oral q morning - 10a  . Vitamin D (Ergocalciferol)  50,000 Units Oral Q Sun   Continuous Infusions: . sodium chloride    . sodium chloride      LOS: 8 days   Domenic Polite  [facility]  07/15/2019, 1:39 PM

## 2019-07-15 NOTE — Procedures (Signed)
Patient was seen on dialysis and the procedure was supervised.  BFR 400  Via TDC BP is  95/52. Goal UF around 2 kg. 3k bath.   Patient appears to be tolerating treatment well.   Hosey Burmester Tanna Furry 07/15/2019

## 2019-07-15 NOTE — Plan of Care (Signed)
  Problem: Activity: Goal: Risk for activity intolerance will decrease Outcome: Progressing   

## 2019-07-16 LAB — CBC
HCT: 30.6 % — ABNORMAL LOW (ref 36.0–46.0)
Hemoglobin: 9.6 g/dL — ABNORMAL LOW (ref 12.0–15.0)
MCH: 34.5 pg — ABNORMAL HIGH (ref 26.0–34.0)
MCHC: 31.4 g/dL (ref 30.0–36.0)
MCV: 110.1 fL — ABNORMAL HIGH (ref 80.0–100.0)
Platelets: 137 10*3/uL — ABNORMAL LOW (ref 150–400)
RBC: 2.78 MIL/uL — ABNORMAL LOW (ref 3.87–5.11)
RDW: 26.3 % — ABNORMAL HIGH (ref 11.5–15.5)
WBC: 1.9 10*3/uL — ABNORMAL LOW (ref 4.0–10.5)
nRBC: 5.2 % — ABNORMAL HIGH (ref 0.0–0.2)

## 2019-07-16 LAB — BASIC METABOLIC PANEL
Anion gap: 14 (ref 5–15)
BUN: 17 mg/dL (ref 6–20)
CO2: 22 mmol/L (ref 22–32)
Calcium: 8.7 mg/dL — ABNORMAL LOW (ref 8.9–10.3)
Chloride: 100 mmol/L (ref 98–111)
Creatinine, Ser: 4.24 mg/dL — ABNORMAL HIGH (ref 0.44–1.00)
GFR calc Af Amer: 13 mL/min — ABNORMAL LOW (ref >60)
GFR calc non Af Amer: 11 mL/min — ABNORMAL LOW (ref >60)
Glucose, Bld: 78 mg/dL (ref 70–99)
Potassium: 3.5 mmol/L (ref 3.5–5.1)
Sodium: 136 mmol/L (ref 135–145)

## 2019-07-16 LAB — PHOSPHORUS: Phosphorus: 3.3 mg/dL (ref 2.5–4.6)

## 2019-07-16 MED ORDER — COLCHICINE 0.6 MG PO TABS
0.3000 mg | ORAL_TABLET | Freq: Every day | ORAL | Status: AC
  Administered 2019-07-16 – 2019-07-17 (×2): 0.3 mg via ORAL
  Filled 2019-07-16 (×2): qty 0.5

## 2019-07-16 NOTE — Progress Notes (Signed)
[facility] NEPHROLOGY PROGRESS NOTE  Assessment/ Plan: Pt is a 54 y.o. yo female  with history of ESRD status post kidney transplant, CKD 4 followed by Dr. Alric Quan, TIA, recent prolonged hospitalization for Covid pneumonia when she developed AKI on CKD, presented with generalized weakness, decreased oral intake and worsening anemia, seen as a consultation for AKI on CKD.  #Acute kidney injury on CKD now progressed to ESRD: The serum creatinine level has been worsening since she had Covid infection in 05/2019.  Started dialysis via right femoral tunnel catheter placed by IR.  Vascular is following for placement of permanent access as outpatient.  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 Status post HD on 6/12 with 1.8 L UF.  Plan for next HD on 6/15 as TTS schedule. Renal navigator is following for outpatient HD arrangement.   #Kidney transplant status: Continue current immunosuppression including Prograf and prednisone.  Imuran on hold because of neutropenia.  #Anemia of CKD: Saturation 83%.  Continue Aranesp.  Monitor hemoglobin.  #Hypotension/volume: Continue midodrine.    #CKD-MBD: Check phosphorus level, continue Renvela.    #Pancytopenia: Continue to hold Imuran.  Seen by hematologist.  #Acute gout flare which was treated with steroid and colchicine.  Subjective: Seen and examined at bedside.  Had dialysis yesterday tolerated well.  Denies nausea vomiting chest pain shortness of breath.  Objective Vital signs in last 24 hours: Vitals:   07/15/19 1643 07/15/19 2123 07/16/19 0535 07/16/19 1010  BP: (!) 102/49 95/71 (!) 96/55 (!) 98/56  Pulse: 68 (!) 55 (!) 57 (!) 58  Resp: 18 17 17    Temp: 98.2 F (36.8 C) 99.1 F (37.3 C) 98.6 F (37 C) 98.5 F (36.9 C)  TempSrc: Oral Oral Oral Oral  SpO2: 91% 94% 96% 98%  Weight:  57.8 kg    Height:       Weight change: 2.3 kg  Intake/Output Summary (Last 24 hours) at  07/16/2019 1056 Last data filed at 07/16/2019 0900 Gross per 24 hour  Intake 720 ml  Output 1800 ml  Net -1080 ml       Labs: Basic Metabolic Panel: Recent Labs  Lab 07/10/19 0415 07/11/19 0422 07/14/19 0313 07/15/19 0437 07/16/19 0703  NA 137   < > 139 138 136  K 4.7   < > 4.1 3.9 3.5  CL 106   < > 101 100 100  CO2 19*   < > 26 24 22   GLUCOSE 80   < > 85 87 78  BUN 85*   < > 19 28* 17  CREATININE 5.56*   < > 3.13* 4.69* 4.24*  CALCIUM 9.1   < > 8.6* 8.7* 8.7*  PHOS 5.9*  --   --   --   --    < > = values in this interval not displayed.   Liver Function Tests: Recent Labs  Lab 07/10/19 0415 07/11/19 0422 07/12/19 0326  AST 24 21 24   ALT 11 13 12   ALKPHOS 116 112 111  BILITOT 1.5* 1.5* 1.8*  PROT 5.7* 6.0* 6.1*  ALBUMIN 3.5 3.6 3.6   No results for input(s): LIPASE, AMYLASE in the last 168 hours. No results for input(s): AMMONIA in the last 168 hours. CBC: Recent Labs  Lab 07/11/19 0422 07/11/19 0422 07/12/19 0326 07/12/19 0326 07/13/19 3474 07/13/19 2595 07/14/19 0313 07/15/19 0437 07/16/19 0703  WBC 1.7*   < > 1.8*   < > 2.3*   < > 2.3*  1.8* 1.9*  NEUTROABS 1.1*  --  1.0*  --  1.7  --   --   --   --   HGB 7.7*   < > 8.3*   < > 9.0*   < > 8.4* 8.7* 9.6*  HCT 24.0*   < > 25.6*   < > 28.1*   < > 26.5* 26.5* 30.6*  MCV 103.4*   < > 103.2*  --  104.1*  --  105.6* 105.6* 110.1*  PLT 133*   < > 134*   < > 152   < > 160 125* 137*   < > = values in this interval not displayed.   Cardiac Enzymes: No results for input(s): CKTOTAL, CKMB, CKMBINDEX, TROPONINI in the last 168 hours. CBG: No results for input(s): GLUCAP in the last 168 hours.  Iron Studies: No results for input(s): IRON, TIBC, TRANSFERRIN, FERRITIN in the last 72 hours. Studies/Results: No results found.  Medications: Infusions:   Scheduled Medications: . aspirin EC  81 mg Oral Daily  . Chlorhexidine Gluconate Cloth  6 each Topical Daily  . colchicine  0.3 mg Oral Daily  .  darbepoetin (ARANESP) injection - DIALYSIS  60 mcg Intravenous Q Sat-HD  . famotidine  20 mg Oral Daily  . gabapentin  100 mg Oral BID  . heparin  5,000 Units Subcutaneous Q8H  . latanoprost  1 drop Left Eye QHS  . midodrine  15 mg Oral BID WC  . pantoprazole  40 mg Oral Q1200  . pneumococcal 23 valent vaccine  0.5 mL Intramuscular Tomorrow-1000  . predniSONE  20 mg Oral Daily  . sevelamer carbonate  800 mg Oral TID WC  . tacrolimus  2 mg Oral QHS  . tacrolimus  3 mg Oral q morning - 10a  . Vitamin D (Ergocalciferol)  50,000 Units Oral Q Sun    have reviewed scheduled and prn medications.  Physical Exam: General:NAD, sitting on bed comfortable, not in distress Heart:RRR, s1s2 nl, no rubs Lungs: Clear b/l, no crackle Abdomen:soft, Non-tender, non-distended Extremities: Edema improved. Neurology: Alert awake and following commands, no asterixis Vascular access: Right femoral TDC.  Riddhi Grether Prasad Girtie Wiersma 07/16/2019,10:56 AM  LOS: 9 days  Pager: [phone]

## 2019-07-16 NOTE — Progress Notes (Signed)
[facility] PROGRESS NOTE    Progress Note  Tina Watkins  RFF:[id number] DOB: October 06, 1965 DOA: 07/07/2019 PCP: [name], FNP   Brief Narrative:   Tina Watkins is an 54 y.o. female end-stage renal disease status post renal transplant in 2005, chronic kidney disease stage IV on chronic steroids and immunosuppressive therapy, chronic anemia in the setting of chronic renal disease and immunosuppression from chemotherapy with intermittent blood transfusions and regular EPO injections by hematology, TIA, CHF, moderate pulmonary hypertension presented with increased fatigue and low hemoglobin.  She was also noted to be in acute kidney injury. 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 Further management per nephrology.  Chronic hypotension Asymptomatic blood pressure is on the low side at baseline -Continue midodrine  Pancytopenia/neutropenia  Multifactorial in the setting of Imuran hematology was consulted and recommended checking B12, copper, HIV which are negative -Imuran held  chronic diastolic heart failure: -Volume managed with HD now -Last echo 01/2019 with restrictive cardiomyopathy, grade 3 diastolic dysfunction, preserved EF, moderate pulmonary hypertension   DVT prophylaxis: Lovenox Family Communication:none Status is: Inpatient  Remains inpatient appropriate because: awaiting CLIP for HD   Dispo: The patient is from: Home              Anticipated d/c is to: Home              Anticipated d/c date is: when clipped for HD              Patient currently is medically stable to d/c.   Code Status:     Code Status Orders  (From admission, onward)         Start     Ordered   07/07/19 1615  Full code  Continuous     07/07/19 1616        Code Status History    Date Active Date Inactive Code Status Order ID Comments User Context   05/09/2019 0706 05/18/2019 2040 Full Code [id number]  Shela Leff, MD ED   03/20/2019 1507 03/24/2019 2243 Full Code [id number]  Lequita Halt, MD Inpatient   11/20/2017 0236 11/20/2017 1916 Full Code [id number]  Ivor Costa, MD ED   08/09/2017 2312 08/17/2017 1950 Full Code [id number]  Kathrene Alu, MD Inpatient   09/29/2015 2318 10/03/2015 1841 Full Code [id number]  Reubin Milan, MD Inpatient   09/29/2015 2318 09/29/2015 2318 Full Code [id number]  Reubin Milan, MD Inpatient   01/08/2015 1637 01/10/2015 1918 Full Code [id number]  Leeroy Cha, MD Inpatient   06/13/2014 1008 06/16/2014 1800 Full Code [id number]  Orson Eva, MD Inpatient  04/10/2012 0109 04/11/2012 2008 Full Code [id number]  Derrill Kay, MD Inpatient   12/10/2011 2204 12/12/2011 1423 Full Code [id number]  Neomia Glass, RN Inpatient   05/22/2011 1520 05/30/2011 1553 Full Code [id number]  Minor, Grace Bushy, NP ED   Advance Care Planning Activity        IV Access:    Peripheral IV   Procedures and  diagnostic studies:   No results found.   Medical Consultants:    None.  Anti-Infectives:   none  Subjective:    Tina Watkins continues to have pain and swelling in her right ankle, big toe which is slowly improving  Objective:    Vitals:   07/15/19 1643 07/15/19 2123 07/16/19 0535 07/16/19 1010  BP: (!) 102/49 95/71 (!) 96/55 (!) 98/56  Pulse: 68 (!) 55 (!) 57 (!) 58  Resp: 18 17 17    Temp: 98.2 F (36.8 C) 99.1 F (37.3 C) 98.6 F (37 C) 98.5 F (36.9 C)  TempSrc: Oral Oral Oral Oral  SpO2: 91% 94% 96% 98%  Weight:  57.8 kg    Height:       SpO2: 98 % O2 Flow Rate (L/min): 2 L/min   Intake/Output Summary (Last 24 hours) at 07/16/2019 1219 Last data filed at 07/16/2019 0900 Gross per 24 hour  Intake 720 ml  Output 0 ml  Net 720 ml   Filed Weights   07/15/19 0840 07/15/19 1215 07/15/19 2123  Weight: 59.6 kg 57.8 kg 57.8 kg    Exam: General exam: Chronically ill pleasant female sitting up in bed, AAOx3, no distress HEENT: No JVD CVS: S1-S2, regular rate rhythm Lungs: Few basilar rales, otherwise clear Abdomen: Soft, nontender, bowel sounds present Extremities: Trace edema, swelling and tenderness of right ankle and first MTP joints slowly improving Skin: No rashes on exposed skin Psychiatry: Flat affect  Data Reviewed:    Labs: Basic Metabolic Panel: Recent Labs  Lab 07/10/19 0415 07/10/19 0415 07/11/19 0422 07/11/19 0422 07/12/19 0326 07/12/19 0326 07/14/19 0313 07/14/19 0313 07/15/19 0437 07/16/19 0703  NA 137   < > 137  --  139  --  139  --  138 136  K 4.7   < > 5.0   < > 4.4   < > 4.1   < > 3.9 3.5  CL 106   < > 101  --  104  --  101  --  100 100  CO2 19*   < > 22  --  22  --  26  --  24 22  GLUCOSE 80   < > 88  --  87  --  85  --  87 78  BUN 85*   < > 87*  --  50*  --  19  --  28* 17  CREATININE 5.56*   < > 5.86*  --  4.40*  --  3.13*  --  4.69* 4.24*  CALCIUM 9.1   < > 9.4  --  8.9  --  8.6*  --  8.7* 8.7*  PHOS  5.9*  --   --   --   --   --   --   --   --  3.3   < > = values in this interval not displayed.   GFR Estimated Creatinine Clearance: 12.1 mL/min (A) (by C-G formula based on SCr of 4.24 mg/dL (H)). Liver Function Tests: Recent Labs  Lab 07/10/19 0415 07/11/19 0422 07/12/19 0326  AST 24 21 24  ALT 11 13 12   ALKPHOS 116 112 111  BILITOT 1.5* 1.5* 1.8*  PROT 5.7* 6.0* 6.1*  ALBUMIN 3.5 3.6 3.6   No results for input(s): LIPASE, AMYLASE in the last 168 hours. No results for input(s): AMMONIA in the last 168 hours. Coagulation profile Recent Labs  Lab 07/11/19 0945  INR 1.1   COVID-19 Labs  No results for input(s): DDIMER, FERRITIN, LDH, CRP in the last 72 hours.  Lab Results  Component Value Date   SARSCOV2NAA POSITIVE (A) 05/09/2019   [place] NEGATIVE 03/17/2019    CBC: Recent Labs  Lab 07/10/19 0415 07/10/19 0415 07/11/19 0422 07/11/19 0422 07/12/19 0326 07/13/19 8315 07/14/19 0313 07/15/19 0437 07/16/19 0703  WBC 1.3*   < > 1.7*   < > 1.8* 2.3* 2.3* 1.8* 1.9*  NEUTROABS 1.0*  --  1.1*  --  1.0* 1.7  --   --   --   HGB 7.9*   < > 7.7*   < > 8.3* 9.0* 8.4* 8.7* 9.6*  HCT 24.2*   < > 24.0*   < > 25.6* 28.1* 26.5* 26.5* 30.6*  MCV 102.1*   < > 103.4*   < > 103.2* 104.1* 105.6* 105.6* 110.1*  PLT 115*   < > 133*   < > 134* 152 160 125* 137*   < > = values in this interval not displayed.   Cardiac Enzymes: No results for input(s): CKTOTAL, CKMB, CKMBINDEX, TROPONINI in the last 168 hours. BNP (last 3 results) No results for input(s): PROBNP in the last 8760 hours. CBG: No results for input(s): GLUCAP in the last 168 hours. D-Dimer: No results for input(s): DDIMER in the last 72 hours. Hgb A1c: No results for input(s): HGBA1C in the last 72 hours. Lipid Profile: No results for input(s): CHOL, HDL, LDLCALC, TRIG, CHOLHDL, LDLDIRECT in the last 72 hours. Thyroid function studies: No results for input(s): TSH, T4TOTAL, T3FREE, THYROIDAB in the last 72  hours.  Invalid input(s): FREET3 Anemia work up: No results for input(s): VITAMINB12, FOLATE, FERRITIN, TIBC, IRON, RETICCTPCT in the last 72 hours. Sepsis Labs: Recent Labs  Lab 07/13/19 0634 07/14/19 0313 07/15/19 0437 07/16/19 0703  WBC 2.3* 2.3* 1.8* 1.9*   Microbiology No results found for this or any previous visit (from the past 240 hour(s)).   Medications:   . aspirin EC  81 mg Oral Daily  . Chlorhexidine Gluconate Cloth  6 each Topical Daily  . colchicine  0.3 mg Oral Daily  . darbepoetin (ARANESP) injection - DIALYSIS  60 mcg Intravenous Q Sat-HD  . famotidine  20 mg Oral Daily  . gabapentin  100 mg Oral BID  . heparin  5,000 Units Subcutaneous Q8H  . latanoprost  1 drop Left Eye QHS  . midodrine  15 mg Oral BID WC  . pantoprazole  40 mg Oral Q1200  . pneumococcal 23 valent vaccine  0.5 mL Intramuscular Tomorrow-1000  . predniSONE  20 mg Oral Daily  . sevelamer carbonate  800 mg Oral TID WC  . tacrolimus  2 mg Oral QHS  . tacrolimus  3 mg Oral q morning - 10a  . Vitamin D (Ergocalciferol)  50,000 Units Oral Q Sun   Continuous Infusions:   LOS: 9 days   Domenic Polite  [facility]  07/16/2019, 12:19 PM

## 2019-07-16 NOTE — Progress Notes (Signed)
Pt is c/o gout pain and is asking for colchicine. Order in [facility] is for once daily and home med list is different. RN messaged MD on call to see if pt can get medication. Awaiting response.   Eleanora Neighbor, RN

## 2019-07-17 ENCOUNTER — Inpatient Hospital Stay (HOSPITAL_COMMUNITY): Payer: Medicare Other

## 2019-07-17 LAB — CBC
HCT: 28.9 % — ABNORMAL LOW (ref 36.0–46.0)
Hemoglobin: 9 g/dL — ABNORMAL LOW (ref 12.0–15.0)
MCH: 33.8 pg (ref 26.0–34.0)
MCHC: 31.1 g/dL (ref 30.0–36.0)
MCV: 108.6 fL — ABNORMAL HIGH (ref 80.0–100.0)
Platelets: 134 10*3/uL — ABNORMAL LOW (ref 150–400)
RBC: 2.66 MIL/uL — ABNORMAL LOW (ref 3.87–5.11)
RDW: 26.5 % — ABNORMAL HIGH (ref 11.5–15.5)
WBC: 1.8 10*3/uL — ABNORMAL LOW (ref 4.0–10.5)
nRBC: 3.3 % — ABNORMAL HIGH (ref 0.0–0.2)

## 2019-07-17 MED ORDER — ACETAMINOPHEN 325 MG PO TABS: 650.0000 mg | ORAL_TABLET | Freq: Four times a day (QID) | ORAL | Status: DC | PRN

## 2019-07-17 MED ORDER — PREDNISONE 20 MG PO TABS
40.0000 mg | ORAL_TABLET | Freq: Every day | ORAL | Status: DC
  Administered 2019-07-17: 40 mg via ORAL
  Filled 2019-07-17: qty 2

## 2019-07-17 MED ORDER — PREDNISONE 20 MG PO TABS: 10.0000 mg | ORAL_TABLET | Freq: Every day | ORAL | 0 refills | Status: DC

## 2019-07-17 MED ORDER — COLCHICINE 0.6 MG PO TABS: 0.3000 mg | ORAL_TABLET | Freq: Every day | ORAL | Status: DC

## 2019-07-17 MED ORDER — CHLORHEXIDINE GLUCONATE CLOTH 2 % EX PADS: 6.0000 | MEDICATED_PAD | Freq: Every day | CUTANEOUS | Status: DC

## 2019-07-17 MED ORDER — MIDODRINE HCL 5 MG PO TABS: 10.0000 mg | ORAL_TABLET | Freq: Two times a day (BID) | ORAL | 0 refills | Status: DC

## 2019-07-17 NOTE — Progress Notes (Signed)
Discharge instructions given. Patient verbalized understanding and all questions were answered.  ?

## 2019-07-17 NOTE — Discharge Summary (Signed)
Physician Discharge Summary  Tina Watkins TOI:[id number] DOB: 26-May-1965 DOA: 07/07/2019  PCP: [name], FNP  Admit date: 07/07/2019 Discharge date: 07/17/2019  Time spent: 35 minutes  Recommendations for Outpatient Follow-up:  Outpatient dialysis tomorrow 6/15 at [facility] Vascular surgery Dr. Carlis Abbott in 3 to 4 weeks for permanent dialysis access   Discharge Diagnoses:  Progressive CKD stage V New ESRD Fluid overload Acute gout flare right ankle Chronic hypotension on midodrine   History of renal transplantation   Acute on chronic kidney failure (HCC)   Anemia in chronic kidney disease   AKI (acute kidney injury) ([place])   Hypotension Anemia of chronic disease   Discharge Condition: Stable  Diet recommendation: Renal  Filed Weights   07/15/19 0840 07/15/19 1215 07/15/19 2123  Weight: 59.6 kg 57.8 kg 57.8 kg    History of present illness:  Tina Watkins is an 54 y.o. female end-stage renal disease status post renal transplant in 2005, chronic kidney disease stage IV on chronic steroids and immunosuppressive therapy, chronic anemia in the setting of chronic renal disease and immunosuppression requiring intermittent blood transfusions and regular EPO injections by hematology, TIA, CHF, moderate pulmonary hypertension presented with increased fatigue and low hemoglobin.  She was also noted to be in acute kidney injury. 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 as well as numerous prior IVC interventions, vascular recommended outpatient follow-up -Completed clip for outpatient dialysis  Acute gout flare -Involving the right ankle, right big toe, x-rays negative for fractures, slow improvement on prednisone and colchicine, taper prednisone over the next 5 days back down to patient's basal dose of 5 mg daily  Anemia of chronic disease -From CKD 4, worsened by bone marrow suppression from transplant meds -Iron stores are okay, transfused 1 unit of PRBC -Continue EPO -Imuran discontinued  Status post renal transplant in 2005 Steroids tacrolimus continued -Imuran held due to cytopenias.  Chronic hypotension Asymptomatic blood pressure is on the low side at baseline -Continue midodrine  Pancytopenia/neutropenia Multifactorial in the setting of Imuran hematology was consulted and recommended checking B12, copper, HIV which are negative -Imuran held  chronic diastolic heart failure: -Volume managed with HD now -Last echo 01/2019 with restrictive cardiomyopathy, grade 3 diastolic dysfunction, preserved EF, moderate pulmonary hypertension   Discharge Exam: Vitals:   07/17/19 0525 07/17/19 0920  BP: (!) 98/52 (!) 96/54  Pulse: (!) 57 (!) 58  Resp: 17 18  Temp: 98.7 F (37.1 C) 98.5 F (36.9 C)  SpO2: 92% 94%    General: Awake alert oriented x3 Cardiovascular: S1-S2, regular rate and rhythm Respiratory: Decreased breath sounds at both bases  Discharge Instructions   Discharge Instructions    Discharge instructions   Complete by: As directed    Renal Diet   Increase activity slowly   Complete by: As directed      Allergies as of 07/17/2019      Reactions   Levofloxacin Itching, Rash   Tape Other (See Comments)   "Certain surgical tapes peel off my skin"      Medication List  STOP taking these medications   azaTHIOprine 50 MG tablet Commonly known as: IMURAN   furosemide 80 MG tablet Commonly  known as: LASIX   sodium bicarbonate 650 MG tablet     TAKE these medications   acetaminophen 325 MG tablet Commonly known as: TYLENOL Take 2 tablets (650 mg total) by mouth every 6 (six) hours as needed for mild pain.   ARANESP (ALBUMIN FREE) IJ Inject as directed every 14 (fourteen) days.   aspirin EC 81 MG tablet Take 81 mg by mouth daily.   colchicine 0.6 MG tablet Take 0.5 tablets (0.3 mg total) by mouth daily. What changed:   how much to take  when to take this  additional instructions   famotidine 20 MG tablet Commonly known as: Pepcid Take 1 tablet (20 mg total) by mouth 2 (two) times daily. What changed:   when to take this  reasons to take this   fluticasone 50 MCG/ACT nasal spray Commonly known as: FLONASE Place 1 spray into both nostrils daily as needed for allergies or rhinitis.   gabapentin 100 MG capsule Commonly known as: NEURONTIN Take 100 mg by mouth 2 (two) times daily.   latanoprost 0.005 % ophthalmic solution Commonly known as: XALATAN Place 1 drop into the left eye at bedtime.   lidocaine 5 % Commonly known as: LIDODERM Place 1 patch onto the skin daily as needed (for pain- remove old patch first).   midodrine 5 MG tablet Commonly known as: PROAMATINE Take 2 tablets (10 mg total) by mouth 2 (two) times daily with a meal. What changed: how much to take   nystatin 100000 UNIT/ML suspension Commonly known as: MYCOSTATIN Use as directed 5 mLs in the mouth or throat daily as needed (FOR THRUSH).   ondansetron 4 MG disintegrating tablet Commonly known as: ZOFRAN-ODT Take 1 tablet (4 mg total) by mouth every 8 (eight) hours as needed for nausea or vomiting.   oxyCODONE-acetaminophen 10-325 MG tablet Commonly known as: PERCOCET Take 1 tablet by mouth See admin instructions. Take 1 tablet by mouth every four to six hours as needed for pain   predniSONE 20 MG tablet Commonly known as: DELTASONE Take 0.5-2 tablets (10-40 mg total) by  mouth daily. Take 57m daily for 2days then 372mdaily for 2days then 1073maily for 2days then back to basal dose of 5mg84mily -long term What changed:   medication strength  how much to take  additional instructions   Proventil HFA 108 (90 Base) MCG/ACT inhaler Generic drug: albuterol Inhale 2 puffs into the lungs 4 (four) times daily as needed for shortness of breath.   tacrolimus 1 MG capsule Commonly known as: PROGRAF Take 2-3 mg by mouth See admin instructions. Take 3 mg by mouth in the morning and 2 mg at bedtime   Vitamin D (Ergocalciferol) 1.25 MG (50000 UNIT) Caps capsule Commonly known as: DRISDOL Take 50,000 Units by mouth every Sunday.   zolpidem 10 MG tablet Commonly known as: AMBIEN Take 10 mg by mouth at bedtime as needed for sleep.      Allergies  Allergen Reactions  . Levofloxacin Itching and Rash  . Tape Other (See Comments)    "Certain surgical tapes peel off my skin"    Follow-up Information    StroAzzie GlatterP. Schedule an appointment as soon as possible for a visit in 1 week(s).   Specialty: Family Medicine Contact information: [address] [phone]  The results of significant diagnostics from this hospitalization (including imaging, microbiology, ancillary and laboratory) are listed below for reference.    Significant Diagnostic Studies: DG Chest 1 View  Result Date: 07/07/2019 CLINICAL DATA:  Weakness EXAM: CHEST  1 VIEW COMPARISON:  05/16/2019 FINDINGS: Small left pleural effusion. Heart is mildly enlarged. Bilateral lower lobe airspace opacities. No acute bony abnormality. IMPRESSION: Bilateral lower lobe atelectasis or infiltrates. Small left effusion. Electronically Signed   By: Rolm Baptise M.D.   On: 07/07/2019 17:13   DG Ankle 2 Views Right  Result Date: 07/17/2019 CLINICAL DATA:  Right ankle pain for 1 week.  Initial encounter. EXAM: RIGHT ANKLE - 2 VIEW COMPARISON:  None.  FINDINGS: No acute bony or joint abnormalities a identified. Joint spaces are preserved. 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 No worrisome bony lesion. No tibiotalar joint effusion. Subcutaneous calcifications about the anterior ankle and dorsum of the foot are noted. Atherosclerotic vascular disease is also seen. IMPRESSION: No acute abnormality.  No evidence of arthropathy. Small medullary infarct distal tibia. Subcutaneous calcifications compatible with prior infectious or inflammatory process. Atherosclerosis. Electronically Signed   By: Inge Rise M.D.   On: 07/17/2019 11:28   IR Fluoro Guide CV Line Right  Result Date: 07/11/2019 CLINICAL DATA:  Failing renal transplant and need for hemodialysis. History of multiple prior tunneled dialysis catheter placements and vascular access interventions including prior SVC stenting with documented SVC stent occlusion in 2001, tunneled femoral dialysis catheter placement in 2536-6440 complicated by IVC stenoses and translumbar IVC dialysis catheter placement in 3474 complicated by IVC stenosis. Request now made to try to place a tunneled dialysis catheter for current hemodialysis needs. Peripheral IV access is also needed for IV conscious sedation during the procedure and IV antibiotic administration. EXAM: 1. PERIPHERAL IV ACCESS VIA RIGHT BASILIC VEIN UNDER ULTRASOUND GUIDANCE 2. RIGHT INTERNAL JUGULAR VEIN ACCESS UNDER ULTRASOUND GUIDANCE 3. TUNNELED CENTRAL VENOUS HEMODIALYSIS CATHETER PLACEMENT VIA RIGHT COMMON FEMORAL VEIN WITH ULTRASOUND AND FLUOROSCOPIC GUIDANCE ANESTHESIA/SEDATION: 1.0 mg IV Versed; 25 mcg IV Fentanyl. Total Moderate Sedation Time:   35 minutes. The patient's level of consciousness and physiologic status were continuously monitored during the procedure by Radiology nursing. MEDICATIONS: 2 g IV Ancef. FLUOROSCOPY TIME:  3 minutes and 24 seconds.  18 mGy. PROCEDURE: The procedure, risks, benefits, and alternatives were  explained to the patient. Questions regarding the procedure were encouraged and answered. The patient understands and consents to the procedure. A timeout was performed prior to initiating the procedure. Ultrasound was used to confirm patency of the right basilic vein in the upper arm. Skin of the right medial upper arm was prepped with chlorhexidine. 1% lidocaine was infiltrated at the level of the skin and subcutaneous tissues. Under ultrasound guidance, a 21 gauge needle was advanced into the right basilic vein. A guidewire was advanced. A 5 French micropuncture dilator was then advanced into the vein. This was capped and secured for IV access during the procedure for IV antibiotic administration and IV conscious sedation. Preliminary ultrasound was performed of the right and left neck and right groin. Ultrasound was used to confirm patency of the right internal jugular vein and right common femoral vein. The right neck and right groin were prepped with chlorhexidine in a sterile fashion, and a sterile drape was applied covering the operative field. Maximum barrier sterile technique with sterile gowns and gloves were used for the procedure. Local anesthesia was provided with 1% lidocaine. A 21 gauge needle  was advanced into the right internal jugular vein under direct, real-time ultrasound guidance. A guidewire was advanced. A transitional dilator was placed. Over a guidewire a 5 [place] catheter was then advanced. Attempt was made to advance the catheter into the SVC over a guidewire. Fluoroscopic spot images were obtained. Access of the right internal jugular vein was then abandoned and hemostasis obtained with manual compression. Under ultrasound guidance, a 21 gauge needle was advanced into the right common femoral vein. Ultrasound image documentation was performed. A venotomy incision was made in the right groin region. After securing guidewire access, a 5 French catheter was advanced over a guidewire.  Guidewire advancement was performed into the inferior vena cava. A wire was kinked to measure appropriate tunneled dialysis catheter length. A Palindrome tunneled hemodialysis catheter measuring 28 cm from tip to cuff was chosen for placement. This was tunneled in a retrograde fashion from the right thigh to the venotomy incision. At the venotomy, serial dilatation was performed and a 16 Fr peel-away sheath was placed over a guidewire. The catheter was then placed through the sheath and the sheath removed. Final catheter positioning was confirmed and documented with a fluoroscopic spot image. The catheter was aspirated, flushed with saline, and injected with appropriate volume heparin dwells. The venotomy incision was closed with subcuticular 4-0 Vicryl. Dermabond was applied to the incision. The catheter exit site was secured with a Prolene retention suture. COMPLICATIONS: None. FINDINGS: Initial ultrasound demonstrates a patent right internal jugular vein. There is no patent left internal jugular vein present with multiple small collateral veins present in the left neck. The right common femoral vein is normally patent in the right groin. Initial access of the right internal jugular vein was performed. A guidewire would not advance beyond the confluence of the brachiocephalic veins at the origin of the upper SVC. 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 Confirmation of venous occlusion was performed with a catheter and guidewire which could not be advanced below the level of chronic venous occlusion. The patient is not a candidate for future tunnel catheter placements in the chest due to chronic IVC occlusion. The right common femoral vein was accessed. A guidewire initially advanced through the common femoral and external iliac veins easily and met some  resistance at the confluence of the common iliac veins and expected lower IVC. A 5 French catheter was able to be advanced over a guidewire to about the level of the mid IVC but there was further resistance to guidewire and catheter passage at this level likely on the basis of chronic stenosis of the IVC. This is also near the location of previous translumbar IVC access for catheter placement. After right femoral tunneled catheter placement, the tip lies in the lower IVC. The catheter aspirates well and is ready for immediate use. Flow rates may be somewhat limited given probable IVC stenosis above the level of the tip of the dialysis catheter. However, this is the best access possible currently for a tunneled catheter and other access sites will be quite limited given findings today and prior history. IMPRESSION: 1. Ultrasound-guided venous access of the right basilic vein in the upper arm performed for IV antibiotic administration and IV conscious sedation during dialysis catheter placement. 2. Patent right internal jugular vein was accessed and chronic occlusion of the entire SVC confirmed by fluoroscopic probing with a catheter  and guidewire. 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 The patient is not a candidate for future catheter placement in the chest given chronic SVC occlusion. 3. Patent right common femoral vein allowing placement of a tunneled dialysis catheter. This catheter was advanced to the level of the lower IVC and measures 28 cm from tip to cuff. A longer catheter was not able to be placed given catheter and guidewire evidence of significant mid IVC stenosis or occlusion based on restricted ability to advance a catheter and guidewire. Electronically Signed   By: Aletta Edouard M.D.   On: 07/11/2019 16:49   IR US Guide Vasc Access Right  Result Date:  07/11/2019 CLINICAL DATA:  Failing renal transplant and need for hemodialysis. History of multiple prior tunneled dialysis catheter placements and vascular access interventions including prior SVC stenting with documented SVC stent occlusion in 2001, tunneled femoral dialysis catheter placement in 3295-1884 complicated by IVC stenoses and translumbar IVC dialysis catheter placement in 1660 complicated by IVC stenosis. Request now made to try to place a tunneled dialysis catheter for current hemodialysis needs. Peripheral IV access is also needed for IV conscious sedation during the procedure and IV antibiotic administration. EXAM: 1. PERIPHERAL IV ACCESS VIA RIGHT BASILIC VEIN UNDER ULTRASOUND GUIDANCE 2. RIGHT INTERNAL JUGULAR VEIN ACCESS UNDER ULTRASOUND GUIDANCE 3. TUNNELED CENTRAL VENOUS HEMODIALYSIS CATHETER PLACEMENT VIA RIGHT COMMON FEMORAL VEIN WITH ULTRASOUND AND FLUOROSCOPIC GUIDANCE ANESTHESIA/SEDATION: 1.0 mg IV Versed; 25 mcg IV Fentanyl. Total Moderate Sedation Time:   35 minutes. The patient's level of consciousness and physiologic status were continuously monitored during the procedure by Radiology nursing. MEDICATIONS: 2 g IV Ancef. FLUOROSCOPY TIME:  3 minutes and 24 seconds.  18 mGy. PROCEDURE: The procedure, risks, benefits, and alternatives were explained to the patient. Questions regarding the procedure were encouraged and answered. The patient understands and consents to the procedure. A timeout was performed prior to initiating the procedure. Ultrasound was used to confirm patency of the right basilic vein in the upper arm. Skin of the right medial upper arm was prepped with chlorhexidine. 1% lidocaine was infiltrated at the level of the skin and subcutaneous tissues. Under ultrasound guidance, a 21 gauge needle was advanced into the right basilic vein. A guidewire was advanced. A 5 French micropuncture dilator was then advanced into the vein. This was capped and secured for IV access during  the procedure for IV antibiotic administration and IV conscious sedation. Preliminary ultrasound was performed of the right and left neck and right groin. Ultrasound was used to confirm patency of the right internal jugular vein and right common femoral vein. The right neck and right groin were prepped with chlorhexidine in a sterile fashion, and a sterile drape was applied covering the operative field. Maximum barrier sterile technique with sterile gowns and gloves were used for the procedure. Local anesthesia was provided with 1% lidocaine. A 21 gauge needle was advanced into the right internal jugular vein under direct, real-time ultrasound guidance. A guidewire was advanced. A transitional dilator was placed. Over a guidewire a 5 [place] catheter was then advanced. Attempt was made to advance the catheter into the SVC over a guidewire. Fluoroscopic spot images were obtained. Access of the right internal jugular vein was then abandoned and hemostasis obtained with manual compression. Under ultrasound guidance, a 21 gauge needle was advanced into the right common femoral vein. Ultrasound image documentation was performed.  A venotomy incision was made in the right groin region. After securing guidewire access, a 5 French catheter was advanced over a guidewire. Guidewire advancement was performed into the inferior vena cava. A wire was kinked to measure appropriate tunneled dialysis catheter length. A Palindrome tunneled hemodialysis catheter measuring 28 cm from tip to cuff was chosen for placement. This was tunneled in a retrograde fashion from the right thigh to the venotomy incision. At the venotomy, serial dilatation was performed and a 16 Fr peel-away sheath was placed over a guidewire. The catheter was then placed through the sheath and the sheath removed. Final catheter positioning was confirmed and documented with a fluoroscopic spot image. The catheter was aspirated, flushed with saline, and injected with  appropriate volume heparin dwells. The venotomy incision was closed with subcuticular 4-0 Vicryl. Dermabond was applied to the incision. The catheter exit site was secured with a Prolene retention suture. COMPLICATIONS: None. FINDINGS: Initial ultrasound demonstrates a patent right internal jugular vein. There is no patent left internal jugular vein present with multiple small collateral veins present in the left neck. The right common femoral vein is normally patent in the right groin. Initial access of the right internal jugular vein was performed. A guidewire would not advance beyond the confluence of the brachiocephalic veins at the origin of the upper SVC. 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 Confirmation of venous occlusion was performed with a catheter and guidewire which could not be advanced below the level of chronic venous occlusion. The patient is not a candidate for future tunnel catheter placements in the chest due to chronic IVC occlusion. The right common femoral vein was accessed. A guidewire initially advanced through the common femoral and external iliac veins easily and met some resistance at the confluence of the common iliac veins and expected lower IVC. A 5 French catheter was able to be advanced over a guidewire to about the level of the mid IVC but there was further resistance to guidewire and catheter passage at this level likely on the basis of chronic stenosis of the IVC. This is also near the location of previous translumbar IVC access for catheter placement. After right femoral tunneled catheter placement, the tip lies in the lower IVC. The catheter aspirates well and is ready for immediate use. Flow rates may be somewhat limited given probable IVC stenosis above the level of the tip of the dialysis catheter. However, this is  the best access possible currently for a tunneled catheter and other access sites will be quite limited given findings today and prior history. IMPRESSION: 1. Ultrasound-guided venous access of the right basilic vein in the upper arm performed for IV antibiotic administration and IV conscious sedation during dialysis catheter placement. 2. 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 The patient is not a candidate for future catheter placement in the chest given chronic SVC occlusion. 3. Patent right common femoral vein allowing placement of a tunneled dialysis catheter. This catheter was advanced to the level of the lower IVC and measures 28 cm from tip to cuff. A longer catheter was not able to be placed given  catheter and guidewire evidence of significant mid IVC stenosis or occlusion based on restricted ability to advance a catheter and guidewire. Electronically Signed   By: Aletta Edouard M.D.   On: 07/11/2019 16:49   IR US Guide Vasc Access Right  Result Date: 07/11/2019 CLINICAL DATA:  Failing renal transplant and need for hemodialysis. History of multiple prior tunneled dialysis catheter placements and vascular access interventions including prior SVC stenting with documented SVC stent occlusion in 2001, tunneled femoral dialysis catheter placement in 2376-2831 complicated by IVC stenoses and translumbar IVC dialysis catheter placement in 5176 complicated by IVC stenosis. Request now made to try to place a tunneled dialysis catheter for current hemodialysis needs. Peripheral IV access is also needed for IV conscious sedation during the procedure and IV antibiotic administration. EXAM: 1. PERIPHERAL IV ACCESS VIA RIGHT BASILIC VEIN  UNDER ULTRASOUND GUIDANCE 2. RIGHT INTERNAL JUGULAR VEIN ACCESS UNDER ULTRASOUND GUIDANCE 3. TUNNELED CENTRAL VENOUS HEMODIALYSIS CATHETER PLACEMENT VIA RIGHT COMMON FEMORAL VEIN WITH ULTRASOUND AND FLUOROSCOPIC GUIDANCE ANESTHESIA/SEDATION: 1.0 mg IV Versed; 25 mcg IV Fentanyl. Total Moderate Sedation Time:   35 minutes. The patient's level of consciousness and physiologic status were continuously monitored during the procedure by Radiology nursing. MEDICATIONS: 2 g IV Ancef. FLUOROSCOPY TIME:  3 minutes and 24 seconds.  18 mGy. PROCEDURE: The procedure, risks, benefits, and alternatives were explained to the patient. Questions regarding the procedure were encouraged and answered. The patient understands and consents to the procedure. A timeout was performed prior to initiating the procedure. Ultrasound was used to confirm patency of the right basilic vein in the upper arm. Skin of the right medial upper arm was prepped with chlorhexidine. 1% lidocaine was infiltrated at the level of the skin and subcutaneous tissues. Under ultrasound guidance, a 21 gauge needle was advanced into the right basilic vein. A guidewire was advanced. A 5 French micropuncture dilator was then advanced into the vein. This was capped and secured for IV access during the procedure for IV antibiotic administration and IV conscious sedation. Preliminary ultrasound was performed of the right and left neck and right groin. Ultrasound was used to confirm patency of the right internal jugular vein and right common femoral vein. The right neck and right groin were prepped with chlorhexidine in a sterile fashion, and a sterile drape was applied covering the operative field. Maximum barrier sterile technique with sterile gowns and gloves were used for the procedure. Local anesthesia was provided with 1% lidocaine. A 21 gauge needle was advanced into the right internal jugular vein under direct, real-time ultrasound guidance. A guidewire was  advanced. A transitional dilator was placed. Over a guidewire a 5 [place] catheter was then advanced. Attempt was made to advance the catheter into the SVC over a guidewire. Fluoroscopic spot images were obtained. Access of the right internal jugular vein was then abandoned and hemostasis obtained with manual compression. Under ultrasound guidance, a 21 gauge needle was advanced into the right common femoral vein. Ultrasound image documentation was performed. A venotomy incision was made in the right groin region. After securing guidewire access, a 5 French catheter was advanced over a guidewire. Guidewire advancement was performed into the inferior vena cava. A wire was kinked to measure appropriate tunneled dialysis catheter length. A Palindrome tunneled hemodialysis catheter measuring 28 cm from tip to cuff was chosen for placement. This was tunneled in a retrograde fashion from the right thigh to the venotomy incision. At the venotomy, serial dilatation was performed and a 16 Fr peel-away  sheath was placed over a guidewire. The catheter was then placed through the sheath and the sheath removed. Final catheter positioning was confirmed and documented with a fluoroscopic spot image. The catheter was aspirated, flushed with saline, and injected with appropriate volume heparin dwells. The venotomy incision was closed with subcuticular 4-0 Vicryl. Dermabond was applied to the incision. The catheter exit site was secured with a Prolene retention suture. COMPLICATIONS: None. FINDINGS: Initial ultrasound demonstrates a patent right internal jugular vein. There is no patent left internal jugular vein present with multiple small collateral veins present in the left neck. The right common femoral vein is normally patent in the right groin. Initial access of the right internal jugular vein was performed. A guidewire would not advance beyond the confluence of the brachiocephalic veins at the origin of the upper SVC. Under  fluoroscopy, there is clearly a large amount of calcification within the upper SVC likely reflecting calcified chronic thrombus which is present above the level of an indwelling lower IVC stent. Confirmation of venous occlusion was performed with a catheter and guidewire which could not be advanced below the level of chronic venous occlusion. The patient is not a candidate for future tunnel catheter placements in the chest due to chronic IVC occlusion. The right common femoral vein was accessed. A guidewire initially advanced through the common femoral and external iliac veins easily and met some resistance at the confluence of the common iliac veins and expected lower IVC. A 5 French catheter was able to be advanced over a guidewire to about the level of the mid IVC but there was further resistance to guidewire and catheter passage at this level likely on the basis of chronic stenosis of the IVC. This is also near the location of previous translumbar IVC access for catheter placement. After right femoral tunneled catheter placement, the tip lies in the lower IVC. The catheter aspirates well and is ready for immediate use. Flow rates may be somewhat limited given probable IVC stenosis above the level of the tip of the dialysis catheter. However, this is the best access possible currently for a tunneled catheter and other access sites will be quite limited given findings today and prior history. IMPRESSION: 1. Ultrasound-guided venous access of the right basilic vein in the upper arm performed for IV antibiotic administration and IV conscious sedation during dialysis catheter placement. 2. 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 The patient  is not a candidate for future catheter placement in the chest given chronic SVC occlusion. 3. Patent right common femoral vein allowing placement of a tunneled dialysis catheter. This catheter was advanced to the level of the lower IVC and measures 28 cm from tip to cuff. A longer catheter was not able to be placed given catheter and guidewire evidence of significant mid IVC stenosis or occlusion based on restricted ability to advance a catheter and guidewire. Electronically Signed   By: Aletta Edouard M.D.   On: 07/11/2019 16:49   IR US Guide Vasc Access Right  Result Date: 07/11/2019 CLINICAL DATA:  Failing renal transplant and need for hemodialysis. History of multiple prior tunneled dialysis catheter placements and vascular access interventions including prior SVC stenting with documented SVC stent occlusion in 2001, tunneled femoral dialysis catheter placement in  3546-5681 complicated by IVC stenoses and translumbar IVC dialysis catheter placement in 2751 complicated by IVC stenosis. Request now made to try to place a tunneled dialysis catheter for current hemodialysis needs. Peripheral IV access is also needed for IV conscious sedation during the procedure and IV antibiotic administration. EXAM: 1. PERIPHERAL IV ACCESS VIA RIGHT BASILIC VEIN UNDER ULTRASOUND GUIDANCE 2. RIGHT INTERNAL JUGULAR VEIN ACCESS UNDER ULTRASOUND GUIDANCE 3. TUNNELED CENTRAL VENOUS HEMODIALYSIS CATHETER PLACEMENT VIA RIGHT COMMON FEMORAL VEIN WITH ULTRASOUND AND FLUOROSCOPIC GUIDANCE ANESTHESIA/SEDATION: 1.0 mg IV Versed; 25 mcg IV Fentanyl. Total Moderate Sedation Time:   35 minutes. The patient's level of consciousness and physiologic status were continuously monitored during the procedure by Radiology nursing. MEDICATIONS: 2 g IV Ancef. FLUOROSCOPY TIME:  3 minutes and 24 seconds.  18 mGy. PROCEDURE: The procedure, risks, benefits, and alternatives were explained to the patient. Questions regarding the procedure were encouraged  and answered. The patient understands and consents to the procedure. A timeout was performed prior to initiating the procedure. Ultrasound was used to confirm patency of the right basilic vein in the upper arm. Skin of the right medial upper arm was prepped with chlorhexidine. 1% lidocaine was infiltrated at the level of the skin and subcutaneous tissues. Under ultrasound guidance, a 21 gauge needle was advanced into the right basilic vein. A guidewire was advanced. A 5 French micropuncture dilator was then advanced into the vein. This was capped and secured for IV access during the procedure for IV antibiotic administration and IV conscious sedation. Preliminary ultrasound was performed of the right and left neck and right groin. Ultrasound was used to confirm patency of the right internal jugular vein and right common femoral vein. The right neck and right groin were prepped with chlorhexidine in a sterile fashion, and a sterile drape was applied covering the operative field. Maximum barrier sterile technique with sterile gowns and gloves were used for the procedure. Local anesthesia was provided with 1% lidocaine. A 21 gauge needle was advanced into the right internal jugular vein under direct, real-time ultrasound guidance. A guidewire was advanced. A transitional dilator was placed. Over a guidewire a 5 [place] catheter was then advanced. Attempt was made to advance the catheter into the SVC over a guidewire. Fluoroscopic spot images were obtained. Access of the right internal jugular vein was then abandoned and hemostasis obtained with manual compression. Under ultrasound guidance, a 21 gauge needle was advanced into the right common femoral vein. Ultrasound image documentation was performed. A venotomy incision was made in the right groin region. After securing guidewire access, a 5 French catheter was advanced over a guidewire. Guidewire advancement was performed into the inferior vena cava. A wire was kinked  to measure appropriate tunneled dialysis catheter length. A Palindrome tunneled hemodialysis catheter measuring 28 cm from tip to cuff was chosen for placement. This was tunneled in a retrograde fashion from the right thigh to the venotomy incision. At the venotomy, serial dilatation was performed and a 16 Fr peel-away sheath was placed over a guidewire. The catheter was then placed through the sheath and the sheath removed. Final catheter positioning was confirmed and documented with a fluoroscopic spot image. The catheter was aspirated, flushed with saline, and injected with appropriate volume heparin dwells. The venotomy incision was closed with subcuticular 4-0 Vicryl. Dermabond was applied to the incision. The catheter exit site was secured with a Prolene retention suture. COMPLICATIONS: None. FINDINGS: Initial ultrasound demonstrates a patent right internal jugular vein. There is no patent  left internal jugular vein present with multiple small collateral veins present in the left neck. The right common femoral vein is normally patent in the right groin. Initial access of the right internal jugular vein was performed. A guidewire would not advance beyond the confluence of the brachiocephalic veins at the origin of the upper SVC. 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 Confirmation of venous occlusion was performed with a catheter and guidewire which could not be advanced below the level of chronic venous occlusion. The patient is not a candidate for future tunnel catheter placements in the chest due to chronic IVC occlusion. The right common femoral vein was accessed. A guidewire initially advanced through the common femoral and external iliac veins easily and met some resistance at the confluence of the common iliac veins and expected lower IVC. A 5  French catheter was able to be advanced over a guidewire to about the level of the mid IVC but there was further resistance to guidewire and catheter passage at this level likely on the basis of chronic stenosis of the IVC. This is also near the location of previous translumbar IVC access for catheter placement. After right femoral tunneled catheter placement, the tip lies in the lower IVC. The catheter aspirates well and is ready for immediate use. Flow rates may be somewhat limited given probable IVC stenosis above the level of the tip of the dialysis catheter. However, this is the best access possible currently for a tunneled catheter and other access sites will be quite limited given findings today and prior history. IMPRESSION: 1. Ultrasound-guided venous access of the right basilic vein in the upper arm performed for IV antibiotic administration and IV conscious sedation during dialysis catheter placement. 2. 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 The patient is not a candidate for future catheter placement in the chest given chronic SVC occlusion. 3. Patent right common femoral vein allowing placement of a tunneled dialysis catheter. This catheter was advanced to the level of the lower IVC and measures 28 cm from tip to cuff. A longer catheter was not able to be placed given catheter and guidewire evidence of significant mid IVC stenosis or occlusion based on restricted ability to advance a catheter and guidewire. Electronically Signed   By: Aletta Edouard M.D.   On: 07/11/2019 16:49   IR RADIOLOGY PERIPHERAL GUIDED IV START  Result Date: 07/11/2019 CLINICAL DATA:  Failing renal transplant and need for hemodialysis. History of  multiple prior tunneled dialysis catheter placements and vascular access interventions including prior SVC stenting with documented SVC stent occlusion in 2001, tunneled femoral dialysis catheter placement in [id number] complicated by IVC stenoses and translumbar IVC dialysis catheter placement in 0947 complicated by IVC stenosis. Request now made to try to place a tunneled dialysis catheter for current hemodialysis needs. Peripheral IV access is also needed for IV conscious sedation during the procedure and IV antibiotic administration. EXAM: 1. PERIPHERAL IV ACCESS VIA RIGHT BASILIC VEIN UNDER ULTRASOUND GUIDANCE 2. RIGHT INTERNAL JUGULAR VEIN ACCESS UNDER ULTRASOUND GUIDANCE 3. TUNNELED CENTRAL VENOUS HEMODIALYSIS CATHETER PLACEMENT VIA RIGHT COMMON FEMORAL VEIN WITH ULTRASOUND AND FLUOROSCOPIC  GUIDANCE ANESTHESIA/SEDATION: 1.0 mg IV Versed; 25 mcg IV Fentanyl. Total Moderate Sedation Time:   35 minutes. The patient's level of consciousness and physiologic status were continuously monitored during the procedure by Radiology nursing. MEDICATIONS: 2 g IV Ancef. FLUOROSCOPY TIME:  3 minutes and 24 seconds.  18 mGy. PROCEDURE: The procedure, risks, benefits, and alternatives were explained to the patient. Questions regarding the procedure were encouraged and answered. The patient understands and consents to the procedure. A timeout was performed prior to initiating the procedure. Ultrasound was used to confirm patency of the right basilic vein in the upper arm. Skin of the right medial upper arm was prepped with chlorhexidine. 1% lidocaine was infiltrated at the level of the skin and subcutaneous tissues. Under ultrasound guidance, a 21 gauge needle was advanced into the right basilic vein. A guidewire was advanced. A 5 French micropuncture dilator was then advanced into the vein. This was capped and secured for IV access during the procedure for IV antibiotic administration and IV conscious sedation. Preliminary  ultrasound was performed of the right and left neck and right groin. Ultrasound was used to confirm patency of the right internal jugular vein and right common femoral vein. The right neck and right groin were prepped with chlorhexidine in a sterile fashion, and a sterile drape was applied covering the operative field. Maximum barrier sterile technique with sterile gowns and gloves were used for the procedure. Local anesthesia was provided with 1% lidocaine. A 21 gauge needle was advanced into the right internal jugular vein under direct, real-time ultrasound guidance. A guidewire was advanced. A transitional dilator was placed. Over a guidewire a 5 [place] catheter was then advanced. Attempt was made to advance the catheter into the SVC over a guidewire. Fluoroscopic spot images were obtained. Access of the right internal jugular vein was then abandoned and hemostasis obtained with manual compression. Under ultrasound guidance, a 21 gauge needle was advanced into the right common femoral vein. Ultrasound image documentation was performed. A venotomy incision was made in the right groin region. After securing guidewire access, a 5 French catheter was advanced over a guidewire. Guidewire advancement was performed into the inferior vena cava. A wire was kinked to measure appropriate tunneled dialysis catheter length. A Palindrome tunneled hemodialysis catheter measuring 28 cm from tip to cuff was chosen for placement. This was tunneled in a retrograde fashion from the right thigh to the venotomy incision. At the venotomy, serial dilatation was performed and a 16 Fr peel-away sheath was placed over a guidewire. The catheter was then placed through the sheath and the sheath removed. Final catheter positioning was confirmed and documented with a fluoroscopic spot image. The catheter was aspirated, flushed with saline, and injected with appropriate volume heparin dwells. The venotomy incision was closed with subcuticular  4-0 Vicryl. Dermabond was applied to the incision. The catheter exit site was secured with a Prolene retention suture. COMPLICATIONS: None. FINDINGS: Initial ultrasound demonstrates a patent right internal jugular vein. There is no patent left internal jugular vein present with multiple small collateral veins present in the left neck. The right common femoral vein is normally patent in the right groin. Initial access of the right internal jugular vein was performed. A guidewire would not advance beyond the confluence of the brachiocephalic veins at the origin of the upper SVC. Under fluoroscopy, there is clearly a large amount of calcification within the upper SVC likely reflecting calcified chronic thrombus which is present above the level of an indwelling lower IVC  stent. Confirmation of venous occlusion was performed with a catheter and guidewire which could not be advanced below the level of chronic venous occlusion. The patient is not a candidate for future tunnel catheter placements in the chest due to chronic IVC occlusion. The right common femoral vein was accessed. A guidewire initially advanced through the common femoral and external iliac veins easily and met some resistance at the confluence of the common iliac veins and expected lower IVC. A 5 French catheter was able to be advanced over a guidewire to about the level of the mid IVC but there was further resistance to guidewire and catheter passage at this level likely on the basis of chronic stenosis of the IVC. This is also near the location of previous translumbar IVC access for catheter placement. After right femoral tunneled catheter placement, the tip lies in the lower IVC. The catheter aspirates well and is ready for immediate use. Flow rates may be somewhat limited given probable IVC stenosis above the level of the tip of the dialysis catheter. However, this is the best access possible currently for a tunneled catheter and other access sites  will be quite limited given findings today and prior history. IMPRESSION: 1. Ultrasound-guided venous access of the right basilic vein in the upper arm performed for IV antibiotic administration and IV conscious sedation during dialysis catheter placement. 2. 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 The patient is not a candidate for future catheter placement in the chest given chronic SVC occlusion. 3. Patent right common femoral vein allowing placement of a tunneled dialysis catheter. This catheter was advanced to the level of the lower IVC and measures 28 cm from tip to cuff. A longer catheter was not able to be placed given catheter and guidewire evidence of significant mid IVC stenosis or occlusion based on restricted ability to advance a catheter and guidewire. Electronically Signed   By: Aletta Edouard M.D.   On: 07/11/2019 16:49    Microbiology: No results found for this or any previous visit (from the past 240 hour(s)).   Labs: Basic Metabolic Panel: Recent Labs  Lab 07/11/19 0422 07/12/19 0326 07/14/19 0313 07/15/19 0437 07/16/19 0703  NA 137 139 139 138 136  K 5.0 4.4 4.1 3.9 3.5  CL 101 104 101 100 100  CO2 _0 GLUCOSE 88 87 85 87 78  BUN 87* 50* 19 28* 17  CREATININE 5.86* 4.40* 3.13* 4.69* 4.24*  CALCIUM 9.4 8.9 8.6* 8.7* 8.7*  PHOS  --   --   --   --  3.3   Liver Function Tests: Recent Labs  Lab 07/11/19 0422 07/12/19 0326  AST 21 24  ALT 13 12  ALKPHOS 112 111  BILITOT 1.5* 1.8*  PROT 6.0* 6.1*  ALBUMIN 3.6 3.6   No results for input(s): LIPASE, AMYLASE in the last 168 hours. No results for input(s): AMMONIA in the last 168 hours. CBC: Recent Labs  Lab 07/11/19 0422  07/11/19 0422 07/12/19 0326 07/12/19 0326 07/13/19 6734 07/14/19 0313 07/15/19 0437 07/16/19 0703 07/17/19 0605  WBC 1.7*   < > 1.8*   < > 2.3* 2.3* 1.8* 1.9* 1.8*  NEUTROABS 1.1*  --  1.0*  --  1.7  --   --   --   --  HGB 7.7*   < > 8.3*   < > 9.0* 8.4* 8.7* 9.6* 9.0*  HCT 24.0*   < > 25.6*   < > 28.1* 26.5* 26.5* 30.6* 28.9*  MCV 103.4*   < > 103.2*   < > 104.1* 105.6* 105.6* 110.1* 108.6*  PLT 133*   < > 134*   < > 152 160 125* 137* 134*   < > = values in this interval not displayed.   Cardiac Enzymes: No results for input(s): CKTOTAL, CKMB, CKMBINDEX, TROPONINI in the last 168 hours. BNP: BNP (last 3 results) Recent Labs    04/17/19 1718 05/17/19 0235 07/07/19 1033  BNP 481.5* 506.4* 474.1*    ProBNP (last 3 results) No results for input(s): PROBNP in the last 8760 hours.  CBG: No results for input(s): GLUCAP in the last 168 hours.     Signed:  Domenic Polite MD.  [facility] 07/17/2019, 2:46 PM

## 2019-07-17 NOTE — Progress Notes (Signed)
Physical Therapy Treatment Patient Details Name: Tina Watkins MRN: [id number] DOB: 01-Aug-1965 Today's Date: 07/17/2019    History of Present Illness Pt 54 y.o. female admitted on 07/07/19 due nonoliguric AKI onc CKD IV versus progressive CKD stage IV. PMH R eye blindstroke, stenosis, seizures, CHF, fibromyaglia , anemia, afib.    PT Comments    Continuing work on functional mobility and activity tolerance;  Session focused on problem-solving to manage at home independently; Walked the hallways, still with a very slow gait speed, but using the RW well to unweigh painful R foot; We discussed and simulated ways to manage stairs with a painful R foot as well; Anticipate that she will return to her normal baseline well when gout flare subsides  Follow Up Recommendations  No PT follow up     Equipment Recommendations  None recommended by PT    Recommendations for Other Services       Precautions / Restrictions Precautions Precautions: Fall Precaution Comments: Fall risk very much reduced with use of RW    Mobility  Bed Mobility                  Transfers Overall transfer level: Needs assistance Equipment used: Rolling walker (2 wheeled) Transfers: Sit to/from Stand Sit to Stand: Supervision         General transfer comment: Supervision for safety  Ambulation/Gait Ambulation/Gait assistance: Supervision Gait Distance (Feet): 75 Feet Assistive device: Rolling walker (2 wheeled) Gait Pattern/deviations: Decreased stance time - right     General Gait Details: Good use of teh RW to unweigh painful RLE in stance; slow, but steady steps with RW; extremely slow cadence   Stairs         General stair comments: We discussed techniques and sequencing to ascend/descend steps with rail and RW; talked through a few different ways; ultimately, pt declined to practice steps   Wheelchair Mobility    Modified Rankin (Stroke Patients Only)       Balance  Overall balance assessment: Needs assistance           Standing balance-Leahy Scale: Good                              Cognition Arousal/Alertness: Awake/alert Behavior During Therapy: WFL for tasks assessed/performed Overall Cognitive Status: Within Functional Limits for tasks assessed (for simple mobility tasks)                                 General Comments: More organized in thought today, though still not entirely clear on details re: how she will manage at home: "I'll make it workMedical laboratory scientific officer Comments        Pertinent Vitals/Pain Pain Assessment: 0-10 Pain Score: 7  Pain Location: R foot secondary to gout flare Pain Descriptors / Indicators: Pounding;Pressure;Squeezing Pain Intervention(s): Monitored during session    Home Living                      Prior Function            PT Goals (current goals can now be found in the care plan section) Acute Rehab PT Goals Patient Stated Goal: get some rest PT Goal Formulation: With patient Time For Goal Achievement: 07/28/19 Potential to Achieve Goals: Good Progress towards PT goals: Progressing  toward goals    Frequency    Min 3X/week      PT Plan Current plan remains appropriate    Co-evaluation              AM-PAC PT "6 Clicks" Mobility   Outcome Measure  Help needed turning from your back to your side while in a flat bed without using bedrails?: None Help needed moving from lying on your back to sitting on the side of a flat bed without using bedrails?: None Help needed moving to and from a bed to a chair (including a wheelchair)?: None Help needed standing up from a chair using your arms (e.g., wheelchair or bedside chair)?: None Help needed to walk in hospital room?: None Help needed climbing 3-5 steps with a railing? : A Little 6 Click Score: 23    End of Session Equipment Utilized During Treatment: Gait belt Activity Tolerance:  Patient tolerated treatment well (despite pain) Patient left: in bed;with call bell/phone within reach Nurse Communication: Mobility status PT Visit Diagnosis: Unsteadiness on feet (R26.81);Pain Pain - Right/Left: Right Pain - part of body: Ankle and joints of foot     Time: 1433-1510 PT Time Calculation (min) (ACUTE ONLY): 37 min  Charges:  $Gait Training: 23-37 mins                     Roney Marion, [place]  Acute Rehabilitation Services Pager [phone] [place] 07/17/2019, 4:26 PM

## 2019-07-17 NOTE — Progress Notes (Signed)
Renal Navigator met with patient to review OP HD clinic information, as Navigator was quite sure that she would not recall or conversation from Friday. Patient reports, "I was incoherent" on Friday. Patient was on speaker phone with her "Maretta Bees," with whom she wanted Navigator to speak in front of. Patient seemed initially disappointed with [facility] due to location, and asked about a seat "on [place]." Navigator does not know which clinic she is referring to and stated that [facility] is the next closest clinic to her home, since the [place] clinic does not have an open chair at this time. Navigator told patient that she can request a transfer in the future if she would like by speaking with the staff at [facility]. Navigator informed patient that Navigator put her schedule letter in her belongings bag (per her request on Friday) and she asked Navigator to retrieve it for her. Navigator gave it to her and the information was reviewed again. Patient then stated that she did not want a TTS seat. Navigator informed that this is all that is available at any clinic at this given time. It was mentioned that she can request to be put on a waiting list if a MWF seat is necessary. She mentioned that she would like to explore returning to PD and Navigator encouraged her to speak with her Nephrology about this as this would give her much more flexibility if she is able to do PD. She states she has done it in the past.  Navigator asked patient about her foot hurting and asked her how she plans to get to/from HD. She abruptly states that she will drive herself and that, "when you live alone, you do what you got to do." Navigator acknowledged that she has not been feeling well, but asked if she has had an opportunity to contact [employer] like we talked about earlier last week when we first met. She states she did not and does not feel "public transportation" is best for her with all the  immunosuppressants she is on. She states she prefers to drive herself and assures Navigator that she is fine to do so. Navigator and patient's aunt suggested that she enroll in [place] [employer] just so that it is available to her in the future if it is ever her "last resort." She agreed. Navigator also encouraged her to think about asking her uncle to provide transportation, just until her foot feels better. Patient thanked Navigator and states she has no questions or needs and is eager to go home today. She understands that she needs to arrive at [facility] clinic tomorrow at 11:15am to sign paperwork prior to her first treatment and will be given her exact chair time from clinic staff tomorrow.  Alphonzo Cruise, [place] Renal Navigator [phone]

## 2019-07-17 NOTE — Plan of Care (Signed)
  Problem: Education: Goal: Knowledge of General Education information will improve Description: Including pain rating scale, medication(s)/side effects and non-pharmacologic comfort measures Outcome: Progressing   Problem: Clinical Measurements: Goal: Ability to maintain clinical measurements within normal limits will improve Outcome: Progressing   

## 2019-07-17 NOTE — Progress Notes (Signed)
[facility] ROUNDING NOTE   Subjective:   This is a 54 year old lady end-stage renal disease status post renal transplant 2005 chronic kidney disease stage IV chronic steroid immune suppressant therapy.  She has a history of TIA congestive heart failure moderate pulmonary hypertension presented with fatigue weakness and symptomatic anemia.  She had a Covid infection in 4/21 and has had a slow decline in her renal function.  She started dialysis through a right femoral dialysis catheter placement interventional radiology.  Vascular surgery is following her for placement of permanent access.  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  Last dialysis treatment 07/15/2019 with ultrafiltration 1.8 L the next dialysis planned for 07/18/2019 she will continue on a TTS schedule for now.  Blood pressure 9852 pulse 57 temperature 98.7 O2 sats 92% room air  Sodium 136 potassium 3.5 chloride 100 CO2 22 BUN 17 creatinine 4.24 glucose 78 calcium 8.7 hemoglobin 9.6 WBC 1.9 platelets 137  Aspirin 81 mg daily colchicine 0.3 mg daily.  Darbepoetin 60 mcg q. Saturday, Pepcid 20 mg daily, Neurontin 100 mg twice daily, heparin 5000 units every 8 hours subcu, midodrine 15 mg twice daily, Protonix 40 mg daily, Renvela 800 mg 3 times daily, prednisone 20 mg daily, tacrolimus 2 mg nightly 3 mg every morning   Objective:  Vital signs in last 24 hours:  Temp:  [98.5 F (36.9 C)-98.8 F (37.1 C)] 98.7 F (37.1 C) (06/14 0525) Pulse Rate:  [50-58] 57 (06/14 0525) Resp:  [17-18] 17 (06/14 0525) BP: (98-113)/(52-64) 98/52 (06/14 0525) SpO2:  [92 %-100 %] 92 % (06/14 0525)  Weight change:  Filed Weights   07/15/19 0840 07/15/19 1215 07/15/19 2123  Weight: 59.6 kg 57.8 kg 57.8 kg    Intake/Output: I/O last 3 completed shifts: In: 1200 [P.O.:1200] Out: 1800 [Other:1800]   Intake/Output this shift:  Total I/O In: 240 [P.O.:240] Out: 0   General:NAD, sitting on bed comfortable, not in  distress Heart:RRR, s1s2 nl, no rubs Lungs: Clear b/l, no crackle Abdomen:soft, Non-tender, non-distended Extremities: Edema improved. Neurology: Alert awake and following commands, no asterixis Vascular access: Right femoral TDC.   Basic Metabolic Panel: Recent Labs  Lab 07/11/19 0422 07/11/19 0422 07/12/19 0326 07/12/19 0326 07/14/19 0313 07/15/19 0437 07/16/19 0703  NA 137  --  139  --  139 138 136  K 5.0  --  4.4  --  4.1 3.9 3.5  CL 101  --  104  --  101 100 100  CO2 22  --  22  --  26 24 22   GLUCOSE 88  --  87  --  85 87 78  BUN 87*  --  50*  --  19 28* 17  CREATININE 5.86*  --  4.40*  --  3.13* 4.69* 4.24*  CALCIUM 9.4   < > 8.9   < > 8.6* 8.7* 8.7*  PHOS  --   --   --   --   --   --  3.3   < > = values in this interval not displayed.    Liver Function Tests: Recent Labs  Lab 07/11/19 0422 07/12/19 0326  AST 21 24  ALT 13 12  ALKPHOS 112 111  BILITOT 1.5* 1.8*  PROT 6.0* 6.1*  ALBUMIN 3.6 3.6   No results for input(s): LIPASE, AMYLASE in the last 168 hours. No results for input(s): AMMONIA in the last 168 hours.  CBC: Recent Labs  Lab 07/11/19 0422 07/11/19 0422 07/12/19 0326 07/13/19  0350 07/14/19 0313 07/15/19 0437 07/16/19 0703  WBC 1.7*   < > 1.8* 2.3* 2.3* 1.8* 1.9*  NEUTROABS 1.1*  --  1.0* 1.7  --   --   --   HGB 7.7*   < > 8.3* 9.0* 8.4* 8.7* 9.6*  HCT 24.0*   < > 25.6* 28.1* 26.5* 26.5* 30.6*  MCV 103.4*   < > 103.2* 104.1* 105.6* 105.6* 110.1*  PLT 133*   < > 134* 152 160 125* 137*   < > = values in this interval not displayed.    Cardiac Enzymes: No results for input(s): CKTOTAL, CKMB, CKMBINDEX, TROPONINI in the last 168 hours.  BNP: Invalid input(s): POCBNP  CBG: No results for input(s): GLUCAP in the last 168 hours.  Microbiology: Results for orders placed or performed during the hospital encounter of 05/08/19  Blood culture (routine x 2)     Status: None   Collection Time: 05/09/19 12:21 AM   Specimen: BLOOD RIGHT  HAND  Result Value Ref Range Status   Specimen Description BLOOD RIGHT HAND  Final   Special Requests   Final    BOTTLES DRAWN AEROBIC AND ANAEROBIC Blood Culture results may not be optimal due to an inadequate volume of blood received in culture bottles   Culture   Final    NO GROWTH 5 DAYS Performed at [facility], [place] [address], [place], [place] [address]    Report Status 05/14/2019 FINAL  Final  SARS CORONAVIRUS 2 (TAT 6-24 HRS) Nasopharyngeal Nasopharyngeal Swab     Status: Abnormal   Collection Time: 05/09/19 12:25 AM   Specimen: Nasopharyngeal Swab  Result Value Ref Range Status   SARS Coronavirus 2 POSITIVE (A) NEGATIVE Final    Comment: RESULT CALLED TO, READ BACK BY AND VERIFIED WITH: RN OSORIO BAILEY AT 0620 ON 05/09/2019 BY MESSAN HOUEGNIFIO (NOTE) SARS-CoV-2 target nucleic acids are DETECTED. The SARS-CoV-2 RNA is generally detectable in upper and lower respiratory specimens during the acute phase of infection. Positive results are indicative of the presence of SARS-CoV-2 RNA. Clinical correlation with patient history and other diagnostic information is  necessary to determine patient infection status. Positive results do not rule out bacterial infection or co-infection with other viruses.  The expected result is Negative. Fact Sheet for Patients: SugarRoll.be Fact Sheet for Healthcare Providers: https://www.woods-mathews.com/ This test is not yet approved or cleared by the [place] FDA and  has been authorized for detection and/or diagnosis of SARS-CoV-2 by FDA under an Emergency Use Authorization (EUA). This EUA will remain  in effect (meaning this tes t can be used) for the duration of the COVID-19 declaration under Section 564(b)(1) of the Act, 21 U.S.C. section 360bbb-3(b)(1), unless the authorization is terminated or revoked sooner. Performed at [facility], [place] [address] [place], [place] [address]    Gram stain     Status: None   Collection Time: 05/09/19  5:26 AM   Specimen: Urine, Random  Result Value Ref Range Status   Specimen Description URINE, RANDOM  Final   Special Requests Immunocompromised  Final   Gram Stain   Final    WBC PRESENT,BOTH PMN AND MONONUCLEAR GRAM POSITIVE COCCI IN PAIRS GRAM VARIABLE ROD CYTOSPIN SMEAR Performed at [facility], [address], [place], [place] [address]    Report Status 05/09/2019 FINAL  Final  Urine culture     Status: Abnormal   Collection Time: 05/09/19  5:26 AM   Specimen: Urine, Random  Result Value Ref Range Status   Specimen Description URINE, RANDOM  Final   Special Requests Immunocompromised  Final   Culture (A)  Final    10,000 COLONIES/mL DIPHTHEROIDS(CORYNEBACTERIUM SPECIES) Standardized susceptibility testing for this organism is not available. Performed at [facility], [place] [address], [place], [place] [address]    Report Status 05/10/2019 FINAL  Final  Blood culture (routine x 2)     Status: None   Collection Time: 05/09/19  6:18 AM   Specimen: BLOOD RIGHT HAND  Result Value Ref Range Status   Specimen Description BLOOD RIGHT HAND  Final   Special Requests   Final    BOTTLES DRAWN AEROBIC AND ANAEROBIC Blood Culture results may not be optimal due to an inadequate volume of blood received in culture bottles   Culture   Final    NO GROWTH 5 DAYS Performed at [facility], [place] [address] [place], [place] [address]    Report Status 05/14/2019 FINAL  Final    Coagulation Studies: No results for input(s): LABPROT, INR in the last 72 hours.  Urinalysis: No results for input(s): COLORURINE, LABSPEC, PHURINE, GLUCOSEU, HGBUR, BILIRUBINUR, KETONESUR, PROTEINUR, UROBILINOGEN, NITRITE, LEUKOCYTESUR in the last 72 hours.  Invalid input(s): APPERANCEUR    Imaging: No results found.   Medications:    . aspirin EC  81 mg Oral Daily  . Chlorhexidine Gluconate Cloth  6 each Topical Daily  .  colchicine  0.3 mg Oral Daily  . darbepoetin (ARANESP) injection - DIALYSIS  60 mcg Intravenous Q Sat-HD  . famotidine  20 mg Oral Daily  . gabapentin  100 mg Oral BID  . heparin  5,000 Units Subcutaneous Q8H  . latanoprost  1 drop Left Eye QHS  . midodrine  15 mg Oral BID WC  . pantoprazole  40 mg Oral Q1200  . pneumococcal 23 valent vaccine  0.5 mL Intramuscular Tomorrow-1000  . predniSONE  20 mg Oral Daily  . sevelamer carbonate  800 mg Oral TID WC  . tacrolimus  2 mg Oral QHS  . tacrolimus  3 mg Oral q morning - 10a  . Vitamin D (Ergocalciferol)  50,000 Units Oral Q Sun   acetaminophen, albuterol, diphenhydrAMINE, diphenhydrAMINE, fluticasone, lidocaine, ondansetron (ZOFRAN) IV, [DISCONTINUED] oxyCODONE-acetaminophen **AND** oxyCODONE, phenol, zolpidem  Assessment/ Plan:   ESRD-status post failed renal transplant.  Has had a slow decline in renal function since her Covid pneumonia or infection in April 2021.  She has challenging access situation and is dialyzing through a right femoral tunnel dialysis catheter at this point.  She will need venogram and close follow-up with vein vascular surgery.  Her next dialysis treatment be 07/18/2019.  We will continue to evaluate patient for placement coordinate with renal navigator.  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  Imuran being held  Acute gout flare being treated with steroids and colchicine  Kidney transplant continues on Prograf 3/2 mg.  Home dose of prednisone 5 mg daily.  Imuran being held secondary to neutropenia.   LOS: [place] @TODAY @6 :39 AM

## 2019-07-18 ENCOUNTER — Telehealth: Payer: Self-pay | Admitting: Physician Assistant

## 2019-07-18 DIAGNOSIS — D509 Iron deficiency anemia, unspecified: Secondary | ICD-10-CM | POA: Diagnosis not present

## 2019-07-18 DIAGNOSIS — N186 End stage renal disease: Secondary | ICD-10-CM | POA: Diagnosis not present

## 2019-07-18 NOTE — Telephone Encounter (Signed)
Transition of care contact from inpatient facility  Date of discharge: 07/17/19 Date of contact: 07/18/19 Method: Phone Spoke to: Patient  Patient contacted to discuss transition of care from recent inpatient hospitalization. Patient was admitted to [facility] from 07/07/19 to 07/17/19 with discharge diagnosis of progressive CKD V/ESRD, fluid overload, goat flare, chronic hypotension on midodrine.  Medication changes were reviewed. Patient reports she is not taking colchine but is on prednisone taper for gout. Also reminded to take midodrine pre-HD.   Patient is currently at her outpatient dialysis center and a provider will see her for a face-to-face visit within 1 week.  Other follow up needed: PCP, VVS. No other needs at this time.   Anice Paganini, PA-C 07/18/2019, 3:13 PM  [facility] Pager: [phone]

## 2019-07-21 ENCOUNTER — Encounter (HOSPITAL_COMMUNITY): Payer: Medicaid Other

## 2019-07-21 ENCOUNTER — Other Ambulatory Visit: Payer: Self-pay

## 2019-07-21 ENCOUNTER — Encounter: Payer: Self-pay | Admitting: Family Medicine

## 2019-07-21 ENCOUNTER — Ambulatory Visit (INDEPENDENT_AMBULATORY_CARE_PROVIDER_SITE_OTHER): Payer: Medicare Other | Admitting: Family Medicine

## 2019-07-21 VITALS — BP 104/57 | HR 55 | Temp 98.9°F | Ht 59.0 in | Wt 130.0 lb

## 2019-07-21 DIAGNOSIS — R0989 Other specified symptoms and signs involving the circulatory and respiratory systems: Secondary | ICD-10-CM

## 2019-07-21 DIAGNOSIS — N186 End stage renal disease: Secondary | ICD-10-CM | POA: Diagnosis not present

## 2019-07-21 DIAGNOSIS — I509 Heart failure, unspecified: Secondary | ICD-10-CM | POA: Diagnosis not present

## 2019-07-21 DIAGNOSIS — M5030 Other cervical disc degeneration, unspecified cervical region: Secondary | ICD-10-CM

## 2019-07-21 DIAGNOSIS — L299 Pruritus, unspecified: Secondary | ICD-10-CM | POA: Diagnosis not present

## 2019-07-21 DIAGNOSIS — Z09 Encounter for follow-up examination after completed treatment for conditions other than malignant neoplasm: Secondary | ICD-10-CM | POA: Diagnosis not present

## 2019-07-21 DIAGNOSIS — Z992 Dependence on renal dialysis: Secondary | ICD-10-CM | POA: Diagnosis not present

## 2019-07-21 DIAGNOSIS — R531 Weakness: Secondary | ICD-10-CM | POA: Diagnosis not present

## 2019-07-21 DIAGNOSIS — L98421 Non-pressure chronic ulcer of back limited to breakdown of skin: Secondary | ICD-10-CM | POA: Diagnosis not present

## 2019-07-21 MED ORDER — AMOXICILLIN-POT CLAVULANATE 875-125 MG PO TABS: 1.0000 | ORAL_TABLET | Freq: Two times a day (BID) | ORAL | 0 refills | Status: AC

## 2019-07-21 MED ORDER — DIPHENHYDRAMINE HCL 25 MG PO CAPS: 25.0000 mg | ORAL_CAPSULE | Freq: Three times a day (TID) | ORAL | 6 refills | Status: DC | PRN

## 2019-07-21 NOTE — Progress Notes (Signed)
Patient Tina Watkins    Established Patient Office Visit  Subjective:  Patient ID: Tina Watkins, female    DOB: [birth year]  Age: 54 y.o. MRN: [id number]  CC:  Chief Complaint  Patient presents with  . Follow-up    wound on butt feels like its open;     HPI Tina Watkins is a 54 year old female who presents for Follow Up today.   Patient Active Problem List   Diagnosis Date Noted  . Weakness 06/13/2019  . Pneumonia due to COVID-19 virus 05/09/2019  . Hypotension 05/09/2019  . Symptomatic anemia 05/09/2019  . CHF (congestive heart failure) ([place]) 05/09/2019  . Swollen abdomen 01/04/2019  . DDD (degenerative disc disease), cervical 12/06/2018  . Neuropathy 12/06/2018  . Paresthesia 10/11/2018  . Neck pain 10/11/2018  . Tobacco abuse 11/20/2017  . Right leg swelling 11/20/2017  . CKD (chronic kidney disease), stage III 11/20/2017  . Right leg pain 11/20/2017  . Stroke (cerebrum) ([place]) 11/20/2017  . GERD (gastroesophageal reflux disease) 11/20/2017  . Acute venous embolism and thrombosis of deep vessels of proximal lower extremity, right ([place]) 11/20/2017  . 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 Thrush, oral   . AKI (acute kidney injury) ([place])   . Multifocal pneumonia 08/09/2017  . Chest pain 09/29/2015  . Cervical stenosis of spinal canal 01/08/2015  . Urinary tract infectious disease   . Muscle spasms of neck 06/15/2014  . Bleeding hemorrhoid 06/15/2014  . Anemia in chronic kidney disease 06/15/2014  . Pyelonephritis, acute 06/13/2014  . Sepsis ([place]) 06/13/2014  . History of kidney transplant   . Chronic pain syndrome 04/09/2012  . Dehydration, mild 04/09/2012  . Gout attack 06/23/2011  . Herpes infection 06/23/2011  . Anxiety 06/23/2011  . E coli bacteremia 06/18/2011  . ESRD (end stage renal disease) ([place]) 06/12/2011  . UTI (lower urinary tract infection)  06/12/2011  . Bacteremia due to Gram-negative bacteria 05/23/2011  . History of renal transplantation 05/22/2011  . Septic shock(785.52) 05/22/2011  . Acute on chronic kidney failure ([place]) 05/22/2011  . Gastroparesis 04/24/2008  . WEIGHT LOSS 08/24/2007  . NAUSEA AND VOMITING 08/24/2007    Past Medical History:  Diagnosis Date  . Bacteremia due to Gram-negative bacteria 05/23/2011  . Blind    right eye  . Blind right eye   . CHF (congestive heart failure) ([place])   . Chronic lower back pain   . Complication of anesthesia    "woke up during OR; I have an extremely high tolerance" (12/11/2011) 1 procedure was graft; the other procedures were procedures that are typically done with sedation.  . DDD (degenerative disc disease), cervical   . Depression   . Dysrhythmia    "tachycardia" (12/11/2011) new onset afib 10/15/14 EKG  . E coli bacteremia 06/18/2011  . Elevated LDL cholesterol level 12/2018  . ESRD (end stage renal disease) ([place]) 06/12/2011  . Fibromyalgia   . Gastroparesis   . Gastroparesis   . GERD (gastroesophageal reflux disease)   . Glaucoma    right eye  . Gout   . H/O carpal tunnel syndrome   . Headache(784.0)    "not often anymore" (12/11/2011)  . Herpes genitalia 1994  . History of blood transfusion    "more than a few times" (12/11/2011)  . History of stomach ulcers   . Hypotension   . Iron deficiency anemia   . New onset  a-fib ([place])    10/15/14 EKG  . Osteopenia   . Peripheral neuropathy 11/2018  . Pressure ulcer of sacral region, stage 1 07/2019  . Seizures ([place]) 1994   "post transplant; only have had that one" (12/11/2011)  . Spinal stenosis in cervical region   . Stroke [facility])     left basal ganglia lacunar infarct; Right frontal lobe lacunar infarct.  . Stroke [facility]) ~ 1999; 2001   "briefly lost my vision; lost my right eye" (12/11/2011)  . Vitamin D deficiency 12/2018   Current Status: Since her last office visit, she is doing well with no complaints. She  is currently on dialysis on Tues, Thurs, and Sats, which she been getting X 3 treatments now. She has right groin dialysis cather. She continues to follow up with Pain Management as needed. She denies fevers, chills, fatigue, recent infections, weight loss, and night sweats. She has not had any headaches, visual changes, dizziness, and falls. No chest pain, heart palpitations, cough and shortness of breath reported. Denies GI problems such as nausea, vomiting, diarrhea, and constipation. She has no reports of blood in stools, dysuria and hematuria. No depression or anxiety, and denies suicidal ideations, homicidal ideations, or auditory hallucinations. She is taking all medications as prescribed. She denies pain today.   Past Surgical History:  Procedure Laterality Date  . ANTERIOR CERVICAL DECOMP/DISCECTOMY FUSION N/A 01/08/2015   Procedure: Anterior Cervical Three-Four/Four-Five Decompression/Diskectomy/Fusion;  Surgeon: Leeroy Cha, MD;  Location: [place] NEURO ORS;  Service: Neurosurgery;  Laterality: N/A;  C3-4 C4-5 Anterior cervical decompression/diskectomy/fusion  . APPENDECTOMY  ~ 2004  . CATARACT EXTRACTION     right eye  . COLONOSCOPY    . ENUCLEATION  2001   "right"  . ESOPHAGOGASTRODUODENOSCOPY (EGD) WITH PROPOFOL N/A 04/21/2012   Procedure: ESOPHAGOGASTRODUODENOSCOPY (EGD) WITH PROPOFOL;  Surgeon: Milus Banister, MD;  Location: [facility] ENDOSCOPY;  Service: Endoscopy;  Laterality: N/A;  . 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 IR FLUORO GUIDE CV LINE RIGHT  07/11/2019  . IR RADIOLOGY PERIPHERAL GUIDED IV START  07/11/2019  . IR US GUIDE VASC ACCESS RIGHT  07/11/2019  . IR US GUIDE VASC ACCESS RIGHT  07/11/2019  . IR US GUIDE VASC ACCESS RIGHT  07/11/2019  . [place]; 1999; 2005   "right"  . MULTIPLE TOOTH EXTRACTIONS    . RIGHT HEART CATH N/A 03/23/2019   Procedure: RIGHT HEART CATH;  Surgeon: Jolaine Artist, MD;   Location: [place] CV LAB;  Service: Cardiovascular;  Laterality: N/A;  . TONSILLECTOMY    . TOTAL NEPHRECTOMY  1988?; 1994; 2005    Family History  Problem Relation Age of Onset  . Glaucoma Mother   . Pancreatic cancer Father   . Multiple sclerosis Brother   . Hypertension Maternal Grandmother   . Breast cancer Neg Hx     Social History   Socioeconomic History  . Marital status: Single    Spouse name: Not on file  . Number of children: 0  . Years of education: some college  . Highest education level: Not on file  Occupational History  . Occupation: disabled    Fish farm manager: UNEMPLOYED  Tobacco Use  . Smoking status: Current Every Day Smoker    Packs/day: 0.50    Years: 35.00    Pack years: 17.50    Types: Cigarettes  . Smokeless tobacco: Never Used  Vaping Use  .  Vaping Use: Never used  Substance and Sexual Activity  . Alcohol use: Yes    Comment: rare  . Drug use: Yes    Types: Marijuana    Comment: occasional use  . Sexual activity: Not Currently  Other Topics Concern  . Not on file  Social History Narrative   Right-handed.   Four cups caffeine per day.   Lives alone.   Social Determinants of Health   Financial Resource Strain:   . Difficulty of Paying Living Expenses:   Food Insecurity:   . Worried About Charity fundraiser in the Last Year:   . Arboriculturist in the Last Year:   Transportation Needs:   . Film/video editor (Medical):   [name] Lack of Transportation (Non-Medical):   Physical Activity:   . Days of Exercise per Week:   . Minutes of Exercise per Session:   Stress:   . Feeling of Stress :   Social Connections:   . Frequency of Communication with Friends and Family:   . Frequency of Social Gatherings with Friends and Family:   . Attends Religious Services:   . Active Member of Clubs or Organizations:   . Attends Archivist Meetings:   [name] Marital Status:   Intimate Partner Violence:   . Fear of Current or Ex-Partner:   .  Emotionally Abused:   [name] Physically Abused:   . Sexually Abused:     Outpatient Medications Prior to Visit  Medication Sig Dispense Refill  . aspirin EC 81 MG tablet Take 81 mg by mouth daily.     . famotidine (PEPCID) 20 MG tablet Take 1 tablet (20 mg total) by mouth 2 (two) times daily. (Patient taking differently: Take 20 mg by mouth as needed for heartburn. ) 60 tablet 11  . fluticasone (FLONASE) 50 MCG/ACT nasal spray Place 1 spray into both nostrils daily as needed for allergies or rhinitis.    [name] gabapentin (NEURONTIN) 100 MG capsule Take 100 mg by mouth 2 (two) times daily.     [name] latanoprost (XALATAN) 0.005 % ophthalmic solution Place 1 drop into the left eye at bedtime.  3  . lidocaine (LIDODERM) 5 % Place 1 patch onto the skin daily as needed (for pain- remove old patch first).     . midodrine (PROAMATINE) 5 MG tablet Take 2 tablets (10 mg total) by mouth 2 (two) times daily with a meal. 60 tablet 0  . nystatin (MYCOSTATIN) 100000 UNIT/ML suspension Use as directed 5 mLs in the mouth or throat daily as needed (FOR THRUSH).     [name] ondansetron (ZOFRAN-ODT) 4 MG disintegrating tablet Take 1 tablet (4 mg total) by mouth every 8 (eight) hours as needed for nausea or vomiting. 20 tablet 11  . oxyCODONE-acetaminophen (PERCOCET) 10-325 MG tablet Take 1 tablet by mouth See admin instructions. Take 1 tablet by mouth every four to six hours as needed for pain    . predniSONE (DELTASONE) 20 MG tablet Take 0.5-2 tablets (10-40 mg total) by mouth daily. Take 40mg  daily for 2days then 30mg  daily for 2days then 10mg  daily for 2days then back to basal dose of 5mg  daily -long term 10 tablet 0  . tacrolimus (PROGRAF) 1 MG capsule Take 2-3 mg by mouth See admin instructions. Take 3 mg by mouth in the morning and 2 mg at bedtime    . Vitamin D, Ergocalciferol, (DRISDOL) 1.25 MG (50000 UNIT) CAPS capsule Take 50,000 Units by mouth every Sunday.     [name]  zolpidem (AMBIEN) 10 MG tablet Take 10 mg by mouth at bedtime  as needed for sleep.    . colchicine 0.6 MG tablet Take 0.5 tablets (0.3 mg total) by mouth daily. (Patient not taking: Reported on 07/21/2019)    . Darbepoetin Alfa (ARANESP, ALBUMIN FREE, IJ) Inject as directed every 14 (fourteen) days. (Patient not taking: Reported on 07/21/2019)    . PROVENTIL HFA 108 (90 Base) MCG/ACT inhaler Inhale 2 puffs into the lungs 4 (four) times daily as needed for shortness of breath. (Patient not taking: Reported on 07/21/2019)  2  . acetaminophen (TYLENOL) 325 MG tablet Take 2 tablets (650 mg total) by mouth every 6 (six) hours as needed for mild pain.     No facility-administered medications prior to visit.    Allergies  Allergen Reactions  . Levofloxacin Itching and Rash  . Tape Other (See Comments)    "Certain surgical tapes peel off my skin"    ROS Review of Systems  Constitutional: Negative.   HENT: Negative.   Eyes: Negative.   Respiratory: Negative.   Cardiovascular: Negative.   Gastrointestinal: Negative.   Endocrine: Negative.   Genitourinary: Negative.        Dialysis treatments began < 1 month ago.   Musculoskeletal: Positive for arthralgias (generalized joint pain).  Skin: Negative.   Allergic/Immunologic: Negative.   Neurological: Positive for dizziness (occasional ), weakness and headaches (occasional).  Hematological: Negative.   Psychiatric/Behavioral: Negative.     Objective:    Physical Exam Vitals and nursing note reviewed. Exam conducted with a chaperone present (Uncle).  Constitutional:      Appearance: Normal appearance.    HENT:     Head: Normocephalic and atraumatic.     Nose: Nose normal.     Mouth/Throat:     Mouth: Mucous membranes are moist.  Cardiovascular:     Rate and Rhythm: Normal rate and regular rhythm.     Pulses: Normal pulses.     Heart sounds: Normal heart sounds.  Pulmonary:     Effort: Pulmonary effort is normal.     Breath sounds: Wheezing (moderate all lobes) and rhonchi (increased all lung  lobes.) present.  Abdominal:     General: Bowel sounds are normal.  Musculoskeletal:     Cervical back: Normal range of motion and neck supple.     Comments: Limited ROM  Skin:    General: Skin is warm and dry.  Neurological:     General: No focal deficit present.     Mental Status: She is oriented to person, place, and time.  Psychiatric:        Mood and Affect: Mood normal.        Behavior: Behavior normal.        Thought Content: Thought content normal.        Judgment: Judgment normal.    BP (!) 104/57 (BP Location: Left Arm, Patient Position: Sitting, Cuff Size: Small)   Pulse (!) 55   Temp 98.9 F (37.2 C)   Ht 4\' 11"  (1.499 m)   Wt 130 lb (59 kg)   SpO2 98%   BMI 26.26 kg/m  Wt Readings from Last 3 Encounters:  07/21/19 130 lb (59 kg)  07/15/19 127 lb 6.8 oz (57.8 kg)  05/17/19 130 lb 1.1 oz (59 kg)     Health Maintenance Due  Topic Date Due  . COVID-19 Vaccine (1) Never done  . TETANUS/TDAP  Never done  . PAP SMEAR-Modifier  Never done  .  COLONOSCOPY  Never done    There are no preventive Watkins reminders to display for this patient.  Lab Results  Component Value Date   TSH 0.878 12/06/2018   Lab Results  Component Value Date   WBC 1.8 (L) 07/17/2019   HGB 9.0 (L) 07/17/2019   HCT 28.9 (L) 07/17/2019   MCV 108.6 (H) 07/17/2019   PLT 134 (L) 07/17/2019   Lab Results  Component Value Date   NA 136 07/16/2019   K 3.5 07/16/2019   CO2 22 07/16/2019   GLUCOSE 78 07/16/2019   BUN 17 07/16/2019   CREATININE 4.24 (H) 07/16/2019   BILITOT 1.8 (H) 07/12/2019   ALKPHOS 111 07/12/2019   AST 24 07/12/2019   ALT 12 07/12/2019   PROT 6.1 (L) 07/12/2019   ALBUMIN 3.6 07/12/2019   CALCIUM 8.7 (L) 07/16/2019   ANIONGAP 14 07/16/2019   Lab Results  Component Value Date   CHOL 214 (H) 12/06/2018   Lab Results  Component Value Date   HDL 89 12/06/2018   Lab Results  Component Value Date   LDLCALC 105 (H) 12/06/2018   Lab Results  Component Value  Date   TRIG 118 12/06/2018   Lab Results  Component Value Date   CHOLHDL 2.4 12/06/2018   Lab Results  Component Value Date   HGBA1C 4.8 05/08/2019    Assessment & Plan:   1. ESRD (end stage renal disease) on dialysis [facility]) Recently began 2 weeks ago. Tolerating dialysis treatments well.  - Ambulatory referral to Home Health  2. Sacral ulcer, limited to breakdown of skin (HCC)  3. Chest congestion We will initiate antibiotic today.  - amoxicillin-clavulanate (AUGMENTIN) 875-125 MG tablet; Take 1 tablet by mouth 2 (two) times daily for 10 days.  Dispense: 20 tablet; Refill: 0  4. Itching - diphenhydrAMINE (BENADRYL) 25 mg capsule; Take 1 capsule (25 mg total) by mouth every 8 (eight) hours as needed.  Dispense: 30 capsule; Refill: 6  5. Weakness Wheelchair for ambulation.  - Ambulatory referral to [place]  6. Congestive heart failure, unspecified HF chronicity, unspecified heart failure type ([place]) Stable today. - Ambulatory referral to Home Health  7. DDD (degenerative disc disease), cervical - Ambulatory referral to Home Health  8. Follow up She will follow up in 3 months.   Meds ordered this encounter  Medications  . diphenhydrAMINE (BENADRYL) 25 mg capsule    Sig: Take 1 capsule (25 mg total) by mouth every 8 (eight) hours as needed.    Dispense:  30 capsule    Refill:  6  . amoxicillin-clavulanate (AUGMENTIN) 875-125 MG tablet    Sig: Take 1 tablet by mouth 2 (two) times daily for 10 days.    Dispense:  20 tablet    Refill:  0   Orders Placed This Encounter  Procedures  . Ambulatory referral to [place]     Referral Orders     Ambulatory referral to [place],  MSN, FNP-BC [facility] [address] [place], [place] [address] [phone] fax   Problem List Items Addressed This Visit      Cardiovascular and Mediastinum     CHF (congestive heart failure) [facility])   Relevant Orders   Ambulatory referral to [place] and Integ[facility]   DDD (degenerative disc disease), cervical   Relevant Orders   Ambulatory referral to [place]  Other   Weakness   Relevant Orders   Ambulatory referral to [place]    Other Visit Diagnoses    ESRD (end stage renal disease) on dialysis [facility])    -  Primary   Relevant Orders   Ambulatory referral to Home Health   Sacral ulcer, limited to breakdown of skin (HCC)       Chest congestion       Relevant Medications   amoxicillin-clavulanate (AUGMENTIN) 875-125 MG tablet   Itching       Relevant Medications   diphenhydrAMINE (BENADRYL) 25 mg capsule   Follow up          Meds ordered this encounter  Medications  . diphenhydrAMINE (BENADRYL) 25 mg capsule    Sig: Take 1 capsule (25 mg total) by mouth every 8 (eight) hours as needed.    Dispense:  30 capsule    Refill:  6  . amoxicillin-clavulanate (AUGMENTIN) 875-125 MG tablet    Sig: Take 1 tablet by mouth 2 (two) times daily for 10 days.    Dispense:  20 tablet    Refill:  0    Follow-up: Return in about 3 months (around 10/21/2019).    [name], FNP

## 2019-07-24 ENCOUNTER — Encounter: Payer: Self-pay | Admitting: Family Medicine

## 2019-07-24 DIAGNOSIS — L8990 Pressure ulcer of unspecified site, unspecified stage: Secondary | ICD-10-CM | POA: Insufficient documentation

## 2019-07-24 DIAGNOSIS — R0989 Other specified symptoms and signs involving the circulatory and respiratory systems: Secondary | ICD-10-CM | POA: Insufficient documentation

## 2019-07-24 DIAGNOSIS — L299 Pruritus, unspecified: Secondary | ICD-10-CM | POA: Insufficient documentation

## 2019-07-28 DIAGNOSIS — Z79899 Other long term (current) drug therapy: Secondary | ICD-10-CM | POA: Diagnosis not present

## 2019-08-01 ENCOUNTER — Ambulatory Visit: Payer: Medicaid Other | Admitting: Family Medicine

## 2019-08-01 ENCOUNTER — Telehealth: Payer: Self-pay | Admitting: Family Medicine

## 2019-08-01 NOTE — Telephone Encounter (Signed)
Pt called needing a referral for a cane

## 2019-08-02 DIAGNOSIS — Z992 Dependence on renal dialysis: Secondary | ICD-10-CM | POA: Diagnosis not present

## 2019-08-02 DIAGNOSIS — N186 End stage renal disease: Secondary | ICD-10-CM | POA: Diagnosis not present

## 2019-08-02 DIAGNOSIS — T8612 Kidney transplant failure: Secondary | ICD-10-CM | POA: Diagnosis not present

## 2019-08-03 DIAGNOSIS — N186 End stage renal disease: Secondary | ICD-10-CM | POA: Diagnosis not present

## 2019-08-03 DIAGNOSIS — T8249XA Other complication of vascular dialysis catheter, initial encounter: Secondary | ICD-10-CM | POA: Diagnosis not present

## 2019-08-03 DIAGNOSIS — Z23 Encounter for immunization: Secondary | ICD-10-CM | POA: Diagnosis not present

## 2019-08-03 DIAGNOSIS — D631 Anemia in chronic kidney disease: Secondary | ICD-10-CM | POA: Diagnosis not present

## 2019-08-03 DIAGNOSIS — N2581 Secondary hyperparathyroidism of renal origin: Secondary | ICD-10-CM | POA: Diagnosis not present

## 2019-08-03 DIAGNOSIS — D688 Other specified coagulation defects: Secondary | ICD-10-CM | POA: Diagnosis not present

## 2019-08-03 DIAGNOSIS — Z992 Dependence on renal dialysis: Secondary | ICD-10-CM | POA: Diagnosis not present

## 2019-08-05 ENCOUNTER — Other Ambulatory Visit: Payer: Self-pay

## 2019-08-05 ENCOUNTER — Emergency Department (HOSPITAL_COMMUNITY): Payer: Medicare Other

## 2019-08-05 ENCOUNTER — Encounter (HOSPITAL_COMMUNITY): Payer: Self-pay

## 2019-08-05 ENCOUNTER — Inpatient Hospital Stay (HOSPITAL_COMMUNITY)
Admission: EM | Admit: 2019-08-05 | Discharge: 2019-08-24 | DRG: 193 | Disposition: A | Discharge status: Skilled Nursing Facility | Payer: Medicare Other | Attending: Internal Medicine | Admitting: Internal Medicine

## 2019-08-05 DIAGNOSIS — M4802 Spinal stenosis, cervical region: Secondary | ICD-10-CM | POA: Diagnosis not present

## 2019-08-05 DIAGNOSIS — N186 End stage renal disease: Secondary | ICD-10-CM | POA: Diagnosis present

## 2019-08-05 DIAGNOSIS — I5032 Chronic diastolic (congestive) heart failure: Secondary | ICD-10-CM | POA: Diagnosis present

## 2019-08-05 DIAGNOSIS — M797 Fibromyalgia: Secondary | ICD-10-CM | POA: Diagnosis present

## 2019-08-05 DIAGNOSIS — Z881 Allergy status to other antibiotic agents status: Secondary | ICD-10-CM

## 2019-08-05 DIAGNOSIS — Z981 Arthrodesis status: Secondary | ICD-10-CM

## 2019-08-05 DIAGNOSIS — M2550 Pain in unspecified joint: Secondary | ICD-10-CM | POA: Diagnosis not present

## 2019-08-05 DIAGNOSIS — J189 Pneumonia, unspecified organism: Secondary | ICD-10-CM | POA: Diagnosis not present

## 2019-08-05 DIAGNOSIS — Z800 Family history of malignant neoplasm of digestive organs: Secondary | ICD-10-CM

## 2019-08-05 DIAGNOSIS — I4891 Unspecified atrial fibrillation: Secondary | ICD-10-CM | POA: Diagnosis present

## 2019-08-05 DIAGNOSIS — Z940 Kidney transplant status: Secondary | ICD-10-CM

## 2019-08-05 DIAGNOSIS — N250 Renal osteodystrophy: Secondary | ICD-10-CM | POA: Diagnosis not present

## 2019-08-05 DIAGNOSIS — I425 Other restrictive cardiomyopathy: Secondary | ICD-10-CM | POA: Diagnosis present

## 2019-08-05 DIAGNOSIS — M1000 Idiopathic gout, unspecified site: Secondary | ICD-10-CM | POA: Diagnosis not present

## 2019-08-05 DIAGNOSIS — F17210 Nicotine dependence, cigarettes, uncomplicated: Secondary | ICD-10-CM | POA: Diagnosis present

## 2019-08-05 DIAGNOSIS — Z83511 Family history of glaucoma: Secondary | ICD-10-CM

## 2019-08-05 DIAGNOSIS — H5461 Unqualified visual loss, right eye, normal vision left eye: Secondary | ICD-10-CM | POA: Diagnosis present

## 2019-08-05 DIAGNOSIS — Z992 Dependence on renal dialysis: Secondary | ICD-10-CM

## 2019-08-05 DIAGNOSIS — K3184 Gastroparesis: Secondary | ICD-10-CM | POA: Diagnosis present

## 2019-08-05 DIAGNOSIS — Y95 Nosocomial condition: Secondary | ICD-10-CM | POA: Diagnosis present

## 2019-08-05 DIAGNOSIS — Z8673 Personal history of transient ischemic attack (TIA), and cerebral infarction without residual deficits: Secondary | ICD-10-CM

## 2019-08-05 DIAGNOSIS — G629 Polyneuropathy, unspecified: Secondary | ICD-10-CM | POA: Diagnosis present

## 2019-08-05 DIAGNOSIS — Z7982 Long term (current) use of aspirin: Secondary | ICD-10-CM

## 2019-08-05 DIAGNOSIS — R52 Pain, unspecified: Secondary | ICD-10-CM | POA: Diagnosis not present

## 2019-08-05 DIAGNOSIS — Z8249 Family history of ischemic heart disease and other diseases of the circulatory system: Secondary | ICD-10-CM

## 2019-08-05 DIAGNOSIS — M1A9XX0 Chronic gout, unspecified, without tophus (tophi): Secondary | ICD-10-CM | POA: Diagnosis present

## 2019-08-05 DIAGNOSIS — M8580 Other specified disorders of bone density and structure, unspecified site: Secondary | ICD-10-CM | POA: Diagnosis present

## 2019-08-05 DIAGNOSIS — Z20822 Contact with and (suspected) exposure to covid-19: Secondary | ICD-10-CM | POA: Diagnosis present

## 2019-08-05 DIAGNOSIS — I5033 Acute on chronic diastolic (congestive) heart failure: Secondary | ICD-10-CM | POA: Diagnosis not present

## 2019-08-05 DIAGNOSIS — I959 Hypotension, unspecified: Secondary | ICD-10-CM | POA: Diagnosis not present

## 2019-08-05 DIAGNOSIS — M5030 Other cervical disc degeneration, unspecified cervical region: Secondary | ICD-10-CM | POA: Diagnosis present

## 2019-08-05 DIAGNOSIS — Z8616 Personal history of COVID-19: Secondary | ICD-10-CM

## 2019-08-05 DIAGNOSIS — Z820 Family history of epilepsy and other diseases of the nervous system: Secondary | ICD-10-CM

## 2019-08-05 DIAGNOSIS — Z7401 Bed confinement status: Secondary | ICD-10-CM | POA: Diagnosis not present

## 2019-08-05 DIAGNOSIS — I272 Pulmonary hypertension, unspecified: Secondary | ICD-10-CM | POA: Diagnosis present

## 2019-08-05 DIAGNOSIS — L89151 Pressure ulcer of sacral region, stage 1: Secondary | ICD-10-CM | POA: Diagnosis not present

## 2019-08-05 DIAGNOSIS — I132 Hypertensive heart and chronic kidney disease with heart failure and with stage 5 chronic kidney disease, or end stage renal disease: Secondary | ICD-10-CM | POA: Diagnosis not present

## 2019-08-05 DIAGNOSIS — M5090 Cervical disc disorder, unspecified, unspecified cervical region: Secondary | ICD-10-CM | POA: Diagnosis not present

## 2019-08-05 DIAGNOSIS — D631 Anemia in chronic kidney disease: Secondary | ICD-10-CM | POA: Diagnosis not present

## 2019-08-05 DIAGNOSIS — H409 Unspecified glaucoma: Secondary | ICD-10-CM | POA: Diagnosis present

## 2019-08-05 DIAGNOSIS — K219 Gastro-esophageal reflux disease without esophagitis: Secondary | ICD-10-CM | POA: Diagnosis not present

## 2019-08-05 DIAGNOSIS — M109 Gout, unspecified: Secondary | ICD-10-CM | POA: Diagnosis not present

## 2019-08-05 DIAGNOSIS — I951 Orthostatic hypotension: Secondary | ICD-10-CM | POA: Diagnosis present

## 2019-08-05 DIAGNOSIS — R0689 Other abnormalities of breathing: Secondary | ICD-10-CM | POA: Diagnosis not present

## 2019-08-05 DIAGNOSIS — F419 Anxiety disorder, unspecified: Secondary | ICD-10-CM | POA: Diagnosis not present

## 2019-08-05 DIAGNOSIS — J811 Chronic pulmonary edema: Secondary | ICD-10-CM | POA: Diagnosis not present

## 2019-08-05 DIAGNOSIS — Z79899 Other long term (current) drug therapy: Secondary | ICD-10-CM

## 2019-08-05 DIAGNOSIS — R5381 Other malaise: Secondary | ICD-10-CM | POA: Diagnosis not present

## 2019-08-05 DIAGNOSIS — M10041 Idiopathic gout, right hand: Secondary | ICD-10-CM | POA: Diagnosis not present

## 2019-08-05 DIAGNOSIS — Z7952 Long term (current) use of systemic steroids: Secondary | ICD-10-CM

## 2019-08-05 DIAGNOSIS — R531 Weakness: Secondary | ICD-10-CM | POA: Diagnosis not present

## 2019-08-05 DIAGNOSIS — R0902 Hypoxemia: Secondary | ICD-10-CM | POA: Diagnosis not present

## 2019-08-05 DIAGNOSIS — Z91048 Other nonmedicinal substance allergy status: Secondary | ICD-10-CM

## 2019-08-05 DIAGNOSIS — D6959 Other secondary thrombocytopenia: Secondary | ICD-10-CM | POA: Diagnosis present

## 2019-08-05 DIAGNOSIS — R9431 Abnormal electrocardiogram [ECG] [EKG]: Secondary | ICD-10-CM | POA: Diagnosis not present

## 2019-08-05 DIAGNOSIS — N08 Glomerular disorders in diseases classified elsewhere: Secondary | ICD-10-CM | POA: Diagnosis present

## 2019-08-05 DIAGNOSIS — R069 Unspecified abnormalities of breathing: Secondary | ICD-10-CM | POA: Diagnosis not present

## 2019-08-05 DIAGNOSIS — R509 Fever, unspecified: Secondary | ICD-10-CM | POA: Diagnosis not present

## 2019-08-05 DIAGNOSIS — J90 Pleural effusion, not elsewhere classified: Secondary | ICD-10-CM | POA: Diagnosis not present

## 2019-08-05 DIAGNOSIS — E8770 Fluid overload, unspecified: Secondary | ICD-10-CM | POA: Diagnosis not present

## 2019-08-05 DIAGNOSIS — D696 Thrombocytopenia, unspecified: Secondary | ICD-10-CM | POA: Diagnosis not present

## 2019-08-05 LAB — CBC WITH DIFFERENTIAL/PLATELET
Abs Immature Granulocytes: 0.12 10*3/uL — ABNORMAL HIGH (ref 0.00–0.07)
Basophils Absolute: 0 10*3/uL (ref 0.0–0.1)
Basophils Relative: 0 %
Eosinophils Absolute: 0 10*3/uL (ref 0.0–0.5)
Eosinophils Relative: 0 %
HCT: 28.9 % — ABNORMAL LOW (ref 36.0–46.0)
Hemoglobin: 9 g/dL — ABNORMAL LOW (ref 12.0–15.0)
Immature Granulocytes: 1 %
Lymphocytes Relative: 3 %
Lymphs Abs: 0.3 10*3/uL — ABNORMAL LOW (ref 0.7–4.0)
MCH: 34.1 pg — ABNORMAL HIGH (ref 26.0–34.0)
MCHC: 31.1 g/dL (ref 30.0–36.0)
MCV: 109.5 fL — ABNORMAL HIGH (ref 80.0–100.0)
Monocytes Absolute: 1.2 10*3/uL — ABNORMAL HIGH (ref 0.1–1.0)
Monocytes Relative: 10 %
Neutro Abs: 10.2 10*3/uL — ABNORMAL HIGH (ref 1.7–7.7)
Neutrophils Relative %: 86 %
Platelets: 89 10*3/uL — ABNORMAL LOW (ref 150–400)
RBC: 2.64 MIL/uL — ABNORMAL LOW (ref 3.87–5.11)
RDW: 22.5 % — ABNORMAL HIGH (ref 11.5–15.5)
WBC: 11.9 10*3/uL — ABNORMAL HIGH (ref 4.0–10.5)
nRBC: 0 % (ref 0.0–0.2)

## 2019-08-05 LAB — URINALYSIS, ROUTINE W REFLEX MICROSCOPIC
Bilirubin Urine: NEGATIVE
Glucose, UA: NEGATIVE mg/dL
Ketones, ur: NEGATIVE mg/dL
Leukocytes,Ua: NEGATIVE
Nitrite: NEGATIVE
Protein, ur: 30 mg/dL — AB
Specific Gravity, Urine: 1.021 (ref 1.005–1.030)
pH: 5 (ref 5.0–8.0)

## 2019-08-05 LAB — COMPREHENSIVE METABOLIC PANEL
ALT: 25 U/L (ref 0–44)
AST: 25 U/L (ref 15–41)
Albumin: 3.1 g/dL — ABNORMAL LOW (ref 3.5–5.0)
Alkaline Phosphatase: 117 U/L (ref 38–126)
Anion gap: 11 (ref 5–15)
BUN: 31 mg/dL — ABNORMAL HIGH (ref 6–20)
CO2: 26 mmol/L (ref 22–32)
Calcium: 8.7 mg/dL — ABNORMAL LOW (ref 8.9–10.3)
Chloride: 98 mmol/L (ref 98–111)
Creatinine, Ser: 4.6 mg/dL — ABNORMAL HIGH (ref 0.44–1.00)
GFR calc Af Amer: 12 mL/min — ABNORMAL LOW (ref >60)
GFR calc non Af Amer: 10 mL/min — ABNORMAL LOW (ref >60)
Glucose, Bld: 91 mg/dL (ref 70–99)
Potassium: 4.5 mmol/L (ref 3.5–5.1)
Sodium: 135 mmol/L (ref 135–145)
Total Bilirubin: 2.5 mg/dL — ABNORMAL HIGH (ref 0.3–1.2)
Total Protein: 5.4 g/dL — ABNORMAL LOW (ref 6.5–8.1)

## 2019-08-05 LAB — SARS CORONAVIRUS 2 BY RT PCR (HOSPITAL ORDER, PERFORMED IN ~~LOC~~ HOSPITAL LAB): SARS Coronavirus 2: NEGATIVE

## 2019-08-05 LAB — LACTIC ACID, PLASMA: Lactic Acid, Venous: 1.3 mmol/L (ref 0.5–1.9)

## 2019-08-05 LAB — I-STAT BETA HCG BLOOD, ED (MC, WL, AP ONLY): I-stat hCG, quantitative: <5 m[IU]/mL (ref <5)

## 2019-08-05 LAB — PROCALCITONIN: Procalcitonin: 2.81 ng/mL

## 2019-08-05 MED ORDER — LATANOPROST 0.005 % OP SOLN
1.0000 [drp] | Freq: Every day | OPHTHALMIC | Status: DC
  Administered 2019-08-06 – 2019-08-24 (×19): 1 [drp] via OPHTHALMIC
  Filled 2019-08-05 (×2): qty 2.5

## 2019-08-05 MED ORDER — LIDOCAINE 5 % EX PTCH: 1.0000 | MEDICATED_PATCH | Freq: Every day | CUTANEOUS | Status: DC | PRN

## 2019-08-05 MED ORDER — VANCOMYCIN HCL IN DEXTROSE 1-5 GM/200ML-% IV SOLN
1000.0000 mg | Freq: Once | INTRAVENOUS | Status: AC
  Administered 2019-08-05: 1000 mg via INTRAVENOUS
  Filled 2019-08-05: qty 200

## 2019-08-05 MED ORDER — ALBUTEROL SULFATE (2.5 MG/3ML) 0.083% IN NEBU: 2.5000 mg | INHALATION_SOLUTION | RESPIRATORY_TRACT | Status: DC | PRN

## 2019-08-05 MED ORDER — FAMOTIDINE 20 MG PO TABS
20.0000 mg | ORAL_TABLET | Freq: Every day | ORAL | Status: DC
  Administered 2019-08-06 – 2019-08-24 (×18): 20 mg via ORAL
  Filled 2019-08-05 (×18): qty 1

## 2019-08-05 MED ORDER — VITAMIN D (ERGOCALCIFEROL) 1.25 MG (50000 UNIT) PO CAPS
50000.0000 [IU] | ORAL_CAPSULE | ORAL | Status: DC
  Administered 2019-08-06 – 2019-08-20 (×3): 50000 [IU] via ORAL
  Filled 2019-08-05 (×3): qty 1

## 2019-08-05 MED ORDER — DIPHENHYDRAMINE HCL 25 MG PO CAPS
25.0000 mg | ORAL_CAPSULE | Freq: Three times a day (TID) | ORAL | Status: DC | PRN
  Administered 2019-08-12: 25 mg via ORAL
  Filled 2019-08-05 (×2): qty 1

## 2019-08-05 MED ORDER — OXYCODONE-ACETAMINOPHEN 10-325 MG PO TABS: 1.0000 | ORAL_TABLET | ORAL | Status: DC

## 2019-08-05 MED ORDER — VANCOMYCIN HCL IN DEXTROSE 500-5 MG/100ML-% IV SOLN: 500.0000 mg | INTRAVENOUS | Status: DC

## 2019-08-05 MED ORDER — ONDANSETRON HCL 4 MG/2ML IJ SOLN
4.0000 mg | Freq: Once | INTRAMUSCULAR | Status: AC
  Administered 2019-08-05: 4 mg via INTRAVENOUS
  Filled 2019-08-05: qty 2

## 2019-08-05 MED ORDER — OXYCODONE HCL 5 MG PO TABS
5.0000 mg | ORAL_TABLET | Freq: Four times a day (QID) | ORAL | Status: DC | PRN
  Administered 2019-08-05 – 2019-08-07 (×2): 5 mg via ORAL
  Filled 2019-08-05 (×2): qty 1

## 2019-08-05 MED ORDER — PIPERACILLIN-TAZOBACTAM 3.375 G IVPB 30 MIN
3.3750 g | Freq: Once | INTRAVENOUS | Status: AC
  Administered 2019-08-05: 3.375 g via INTRAVENOUS
  Filled 2019-08-05: qty 50

## 2019-08-05 MED ORDER — ASPIRIN EC 81 MG PO TBEC
81.0000 mg | DELAYED_RELEASE_TABLET | Freq: Every day | ORAL | Status: DC
  Administered 2019-08-06 – 2019-08-24 (×19): 81 mg via ORAL
  Filled 2019-08-05 (×20): qty 1

## 2019-08-05 MED ORDER — MIDODRINE HCL 5 MG PO TABS
5.0000 mg | ORAL_TABLET | Freq: Two times a day (BID) | ORAL | Status: DC
  Administered 2019-08-06 (×2): 5 mg via ORAL
  Filled 2019-08-05 (×2): qty 1

## 2019-08-05 MED ORDER — COLCHICINE 0.6 MG PO TABS: 0.3000 mg | ORAL_TABLET | Freq: Every day | ORAL | Status: DC

## 2019-08-05 MED ORDER — TACROLIMUS 1 MG PO CAPS
2.0000 mg | ORAL_CAPSULE | Freq: Every day | ORAL | Status: DC
  Administered 2019-08-05 – 2019-08-24 (×20): 2 mg via ORAL
  Filled 2019-08-05 (×22): qty 2

## 2019-08-05 MED ORDER — HYDROMORPHONE HCL 1 MG/ML IJ SOLN
0.5000 mg | Freq: Once | INTRAMUSCULAR | Status: AC
  Administered 2019-08-05: 0.5 mg via INTRAVENOUS
  Filled 2019-08-05: qty 1

## 2019-08-05 MED ORDER — TACROLIMUS 1 MG PO CAPS
3.0000 mg | ORAL_CAPSULE | Freq: Every day | ORAL | Status: DC
  Administered 2019-08-06 – 2019-08-13 (×8): 3 mg via ORAL
  Filled 2019-08-05 (×9): qty 3

## 2019-08-05 MED ORDER — HYDROMORPHONE HCL 1 MG/ML IJ SOLN
1.0000 mg | INTRAMUSCULAR | Status: DC | PRN
  Administered 2019-08-05 – 2019-08-06 (×6): 1 mg via INTRAVENOUS
  Filled 2019-08-05 (×7): qty 1

## 2019-08-05 MED ORDER — PREDNISONE 20 MG PO TABS
30.0000 mg | ORAL_TABLET | Freq: Two times a day (BID) | ORAL | Status: DC
  Administered 2019-08-06 – 2019-08-08 (×5): 30 mg via ORAL
  Filled 2019-08-05 (×5): qty 1

## 2019-08-05 MED ORDER — ONDANSETRON 4 MG PO TBDP
4.0000 mg | ORAL_TABLET | Freq: Three times a day (TID) | ORAL | Status: DC | PRN
  Administered 2019-08-06 – 2019-08-21 (×9): 4 mg via ORAL
  Filled 2019-08-05 (×9): qty 1

## 2019-08-05 MED ORDER — ALBUTEROL SULFATE (2.5 MG/3ML) 0.083% IN NEBU: 2.5000 mg | INHALATION_SOLUTION | Freq: Four times a day (QID) | RESPIRATORY_TRACT | Status: DC | PRN

## 2019-08-05 MED ORDER — PIPERACILLIN-TAZOBACTAM IN DEX 2-0.25 GM/50ML IV SOLN
2.2500 g | Freq: Three times a day (TID) | INTRAVENOUS | Status: DC
  Administered 2019-08-05 – 2019-08-08 (×8): 2.25 g via INTRAVENOUS
  Filled 2019-08-05 (×10): qty 50

## 2019-08-05 MED ORDER — FLUTICASONE PROPIONATE 50 MCG/ACT NA SUSP
1.0000 | Freq: Every day | NASAL | Status: DC | PRN
  Filled 2019-08-05: qty 16

## 2019-08-05 MED ORDER — OXYCODONE-ACETAMINOPHEN 5-325 MG PO TABS
1.0000 | ORAL_TABLET | Freq: Four times a day (QID) | ORAL | Status: DC | PRN
  Administered 2019-08-05 – 2019-08-07 (×4): 1 via ORAL
  Filled 2019-08-05 (×4): qty 1

## 2019-08-05 MED ORDER — GUAIFENESIN ER 600 MG PO TB12
600.0000 mg | ORAL_TABLET | Freq: Two times a day (BID) | ORAL | Status: DC
  Administered 2019-08-05 – 2019-08-24 (×33): 600 mg via ORAL
  Filled 2019-08-05 (×34): qty 1

## 2019-08-05 MED ORDER — SODIUM CHLORIDE 0.9 % IV BOLUS
250.0000 mL | Freq: Once | INTRAVENOUS | Status: AC
  Administered 2019-08-05: 250 mL via INTRAVENOUS

## 2019-08-05 MED ORDER — CHLORHEXIDINE GLUCONATE CLOTH 2 % EX PADS
6.0000 | MEDICATED_PAD | Freq: Every day | CUTANEOUS | Status: DC
  Administered 2019-08-06 – 2019-08-07 (×2): 6 via TOPICAL

## 2019-08-05 MED ORDER — NYSTATIN 100000 UNIT/ML MT SUSP
5.0000 mL | Freq: Every day | OROMUCOSAL | Status: DC | PRN
  Filled 2019-08-05: qty 5

## 2019-08-05 MED ORDER — HEPARIN SODIUM (PORCINE) 5000 UNIT/ML IJ SOLN
5000.0000 [IU] | Freq: Two times a day (BID) | INTRAMUSCULAR | Status: DC
  Administered 2019-08-05 – 2019-08-16 (×20): 5000 [IU] via SUBCUTANEOUS
  Filled 2019-08-05 (×26): qty 1

## 2019-08-05 MED ORDER — ZOLPIDEM TARTRATE 5 MG PO TABS
10.0000 mg | ORAL_TABLET | Freq: Every evening | ORAL | Status: DC | PRN
  Administered 2019-08-08 – 2019-08-23 (×4): 10 mg via ORAL
  Filled 2019-08-05 (×4): qty 2

## 2019-08-05 MED ORDER — GABAPENTIN 100 MG PO CAPS
100.0000 mg | ORAL_CAPSULE | Freq: Two times a day (BID) | ORAL | Status: DC
  Administered 2019-08-05 – 2019-08-19 (×28): 100 mg via ORAL
  Filled 2019-08-05 (×29): qty 1

## 2019-08-05 NOTE — Consult Note (Signed)
Renal Service Consult Note [facility]  Tina Watkins 08/05/2019 Sol Blazing Requesting Physician:  Dr Roosevelt Locks, Mamie Nick.   Reason for Consult:  ESRD pt w/  HPI: The patient is a 54 y.o. year-old ESRD status post kidney transplant, CHF, TIA, recent prolonged hospitalization for Covid pneumonia in April 2021 w/ AKI on CKD, then more recently was admitted 6/4 - 07/17/19 for progression to ESRD and was started on HD. She had R fem TDC placed by IR. Unable to place IJ access due to central venous occlusion/ stenosis (couldn't pass the wire). Also was seen by [facility] for permanent access which there were limited options given central venous stenosis. Plan at dc was to f/u w/ [facility] for central venogram and consideration of perm access (hx of mult failed UE accesses). Nephrology suggested waiting for 4 wks for a CT venogram to avoid contrast and see if her renal function might recover. She was dc'd on prograf and prednisone, imuran was on hold due to leukopenia and to have HD 3x per week in OP setting.  Pt presents now w/ R leg swelling and pain, fever to 101.  CXR showing LLL PNA.  WBC 11.8 up from 1.8 2 wks ago. Admitted and started on IV abx w/ vanc and zosyn.  Labs show creat 4.6 and K 4.5.  Missed HD today due to ED visit.  Asked to see for renal failure.   Pt seen in ED. Her main c/o's are gout pain and swelling in multiple places, the worst being R hand and R foot. Also has some edema R hip and breast. Hasn't missed any HD since starting 2-3 wks ago, except today.  No sob. No prod cough. No chest pain or abd pain. No pain in R groin where HD cath is.   ROS  denies CP  no joint pain   no HA  no blurry vision  no rash  no diarrhea  no nausea/ vomiting  no dysuria  no difficulty voiding  no change in urine color    Past Medical History  Past Medical History:  Diagnosis Date  . Bacteremia due to Gram-negative bacteria 05/23/2011  . Blind    right eye  . Blind right eye   .  CHF (congestive heart failure) ([place])   . Chronic lower back pain   . Complication of anesthesia    "woke up during OR; I have an extremely high tolerance" (12/11/2011) 1 procedure was graft; the other procedures were procedures that are typically done with sedation.  . DDD (degenerative disc disease), cervical   . Depression   . Dysrhythmia    "tachycardia" (12/11/2011) new onset afib 10/15/14 EKG  . E coli bacteremia 06/18/2011  . Elevated LDL cholesterol level 12/2018  . ESRD (end stage renal disease) ([place]) 06/12/2011  . Fibromyalgia   . Gastroparesis   . Gastroparesis   . GERD (gastroesophageal reflux disease)   . Glaucoma    right eye  . Gout   . H/O carpal tunnel syndrome   . Headache(784.0)    "not often anymore" (12/11/2011)  . Herpes genitalia 1994  . History of blood transfusion    "more than a few times" (12/11/2011)  . History of stomach ulcers   . Hypotension   . Iron deficiency anemia   . New onset a-fib ([employer])    10/15/14 EKG  . Osteopenia   . Peripheral neuropathy 11/2018  . Pressure ulcer of sacral region, stage 1 07/2019  . Seizures ([place])  1994   "post transplant; only have had that one" (12/11/2011)  . Spinal stenosis in cervical region   . Stroke [facility])     left basal ganglia lacunar infarct; Right frontal lobe lacunar infarct.  . Stroke [facility]) ~ 1999; 2001   "briefly lost my vision; lost my right eye" (12/11/2011)  . Vitamin D deficiency 12/2018   Past Surgical History  Past Surgical History:  Procedure Laterality Date  . ANTERIOR CERVICAL DECOMP/DISCECTOMY FUSION N/A 01/08/2015   Procedure: Anterior Cervical Three-Four/Four-Five Decompression/Diskectomy/Fusion;  Surgeon: Leeroy Cha, MD;  Location: [facility];  Service: Neurosurgery;  Laterality: N/A;  C3-4 C4-5 Anterior cervical decompression/diskectomy/fusion  . APPENDECTOMY  ~ 2004  . CATARACT EXTRACTION     right eye  . COLONOSCOPY    . ENUCLEATION  2001   "right"  . ESOPHAGOGASTRODUODENOSCOPY (EGD)  WITH PROPOFOL N/A 04/21/2012   Procedure: ESOPHAGOGASTRODUODENOSCOPY (EGD) WITH PROPOFOL;  Surgeon: Milus Banister, MD;  Location: [facility];  Service: Endoscopy;  Laterality: N/A;  . 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 IR FLUORO GUIDE CV LINE RIGHT  07/11/2019  . IR RADIOLOGY PERIPHERAL GUIDED IV START  07/11/2019  . IR US GUIDE VASC ACCESS RIGHT  07/11/2019  . IR US GUIDE VASC ACCESS RIGHT  07/11/2019  . IR US GUIDE VASC ACCESS RIGHT  07/11/2019  . [place]; 1999; 2005   "right"  . MULTIPLE TOOTH EXTRACTIONS    . RIGHT HEART CATH N/A 03/23/2019   Procedure: RIGHT HEART CATH;  Surgeon: Jolaine Artist, MD;  Location: [facility];  Service: Cardiovascular;  Laterality: N/A;  . TONSILLECTOMY    . TOTAL NEPHRECTOMY  1988?; 1994; 2005   Family History  Family History  Problem Relation Age of Onset  . Glaucoma Mother   . Pancreatic cancer Father   . Multiple sclerosis Brother   . Hypertension Maternal Grandmother   . Breast cancer Neg Hx    Social History  reports that she has been smoking cigarettes. She has a 17.50 pack-year smoking history. She has never used smokeless tobacco. She reports current alcohol use. She reports current drug use. Drug: Marijuana. Allergies  Allergies  Allergen Reactions  . Levofloxacin Itching and Rash  . Tape Other (See Comments)    "Certain surgical tapes peel off my skin"   Home medications Prior to Admission medications   Medication Sig Start Date End Date Taking? Authorizing Provider  aspirin EC 81 MG tablet Take 81 mg by mouth daily.    Yes [provider]  diphenhydrAMINE (BENADRYL) 25 mg capsule Take 1 capsule (25 mg total) by mouth every 8 (eight) hours as needed. Patient taking differently: Take 25 mg by mouth every 8 (eight) hours as needed for itching.  07/21/19  Yes [name], FNP  famotidine (PEPCID) 20 MG tablet Take 1 tablet (20 mg  total) by mouth 2 (two) times daily. 06/13/19  Yes [name], FNP  fluticasone (FLONASE) 50 MCG/ACT nasal spray Place 1 spray into both nostrils daily as needed for allergies or rhinitis.   Yes [provider]  gabapentin (NEURONTIN) 100 MG capsule Take 100 mg by mouth 2 (two) times daily.  09/27/18  Yes [provider]  latanoprost (XALATAN) 0.005 % ophthalmic solution Place 1 drop into the left eye at bedtime. 03/30/17  Yes [provider]  lidocaine (LIDODERM) 5 % Place 1  patch onto the skin daily as needed (for pain- remove old patch first).  10/28/18  Yes [provider]  midodrine (PROAMATINE) 5 MG tablet Take 2 tablets (10 mg total) by mouth 2 (two) times daily with a meal. Patient taking differently: Take 5 mg by mouth 2 (two) times daily with a meal.  07/17/19  Yes Domenic Polite, MD  nystatin (MYCOSTATIN) 100000 UNIT/ML suspension Use as directed 5 mLs in the mouth or throat daily as needed (FOR THRUSH).    Yes [provider]  ondansetron (ZOFRAN-ODT) 4 MG disintegrating tablet Take 1 tablet (4 mg total) by mouth every 8 (eight) hours as needed for nausea or vomiting. 06/13/19  Yes [name], FNP  oxyCODONE-acetaminophen (PERCOCET) 10-325 MG tablet Take 1 tablet by mouth See admin instructions. Take 1 tablet by mouth every four to six hours as needed for pain   Yes [provider]  predniSONE (DELTASONE) 20 MG tablet Take 0.5-2 tablets (10-40 mg total) by mouth daily. Take 40mg  daily for 2days then 30mg  daily for 2days then 10mg  daily for 2days then back to basal dose of 5mg  daily -long term Patient taking differently: Take 20 mg by mouth daily with breakfast.  07/17/19  Yes Domenic Polite, MD  PROVENTIL HFA 108 [phone] Base) MCG/ACT inhaler Inhale 2 puffs into the lungs 4 (four) times daily as needed for shortness of breath.  08/04/17  Yes [provider]  tacrolimus (PROGRAF) 1 MG capsule Take 2-3 mg by mouth See admin  instructions. Take 3 mg by mouth in the morning and 2 mg at bedtime 06/22/11  Yes [name], MD  Vitamin D, Ergocalciferol, (DRISDOL) 1.25 MG (50000 UNIT) CAPS capsule Take 50,000 Units by mouth every Sunday.    Yes [provider]  zolpidem (AMBIEN) 10 MG tablet Take 10 mg by mouth at bedtime as needed for sleep.   Yes [provider]  colchicine 0.6 MG tablet Take 0.5 tablets (0.3 mg total) by mouth daily. Patient not taking: Reported on 07/21/2019 07/17/19   Domenic Polite, MD  Darbepoetin Alfa (ARANESP, ALBUMIN FREE, IJ) Inject as directed every 14 (fourteen) days.  Patient not taking: Reported on 08/05/2019    [provider]     Vitals:   08/05/19 1445 08/05/19 1458 08/05/19 1545 08/05/19 1554  BP: (!) 94/52  (!) 88/52   Pulse:  92  85  Resp: 19 17  15   Temp:      TempSrc:      SpO2:  99%  100%  Weight:      Height:       Exam Gen some puffiness of eyelids, alert, moaning in pain No rash, cyanosis or gangrene Sclera anicteric, throat clear  +JVD looks chronic Chest slightly rales and rhonchi L base, R side clear R breast edema RRR no MRG Abd soft ntnd no mass or ascites +bs, R lower abd wall 1-2+ edema GU defer MS marked erythema/ edema most fingers on R hand, also swelling/ erythema and extreme tenderness R 1st MTP and instep Ext 1+ pretib edema, 1-2+ hip edema Neuro is alert, Ox 3 , nf  R fem TDC exit site clean w/o pus/ erythema    Home meds:  - asa 81/ midodrine 5 mg bid  - prograf 3mg  am + 2mg  pm/ prednisone 20mg  qd  - percocet 10 qid prn/ neurontin 100 bid  - pepcid 20 bid  - nystatin 5 cc prn thrush  - eyedrops/ prn's/ vitamins  CXR 7/3 - IMPRESSION: 1. Interval development of LEFT LOWER lobe consolidation/infiltrate and LEFT pleural effusion. 2. Increased interstitial pulmonary edema.   CXR - to my read no sig IS edema, CXR looks similar to last film except retrocardiac density is increased   Alb 3.1, tbili 2.5 AST/ ALT  wnl  K 4.5  BUN 31  Cr 4.6   Hb 9  plt 89k  WBC 11.9    UA - pending    OP HD: G-O  TTS   4h, other orders pending   Assessment/ Plan: 1. Fever/ ^wBC - suspected LLL PNA, started on IV vanc/ zosyn. If Vanc is not needed, please dc asap to protect pt's residual renal fxn. Line sepsis is possible, await blood cx's. Line not grossly infected.  2. Gout flare - severe changes R hand >  R foot, per primary colchicine and prednisone 3. ESRD / failed renal Tx (2005) - started back on HD 2-3 wks ago here. Labs looks very good, will plan short HD today using the fem TDC to keep on schedule.  4. Vol overload - +edema on exam, questionable edema by CXR.  Hypotensive but this is likely long-term as pt on midodrine bid. Max UF w/ HD tonight as BP's will tolerate.  5. H/o failed renal TX (2005) - continue prograf and prednisone 6. Anemia ckd - get records in am 7. MBD ckd - Ca okay, check phos 8. H/o COVID infection - April 2019 here w/ COVID pna and prolonged admit. Pt not tested by admit team but HD unit policy requires neg PCR or Ag test prior to pt coming up for dialysis.  Have d/w ID on call, will get 1- 2 hr PCR test.       Kelly Splinter  MD 08/05/2019, 5:04 PM  Recent Labs  Lab 08/05/19 1206  WBC 11.9*  HGB 9.0*   Recent Labs  Lab 08/05/19 1206  K 4.5  BUN 31*  CREATININE 4.60*  CALCIUM 8.7*

## 2019-08-05 NOTE — ED Provider Notes (Signed)
[facility] EMERGENCY DEPARTMENT Provider Note   CSN: [id number] Arrival date & time: 08/05/19  1112     History No chief complaint on file.   Tina Watkins is a 54 y.o. female.  54 yo F with a chief complaint of fever and pain all over.  Is been going on for about a week or so she thinks.  She has diffuse edema as well.  Family came over to take her to dialysis and she had some pain just getting up and getting into a chair that they decided to call 911.  Has some cough.  Denies nausea vomiting or diarrhea.  The history is provided by the patient and a relative.  Illness Severity:  Moderate Onset quality:  Gradual Duration:  1 week Timing:  Constant Progression:  Worsening Chronicity:  New Associated symptoms: cough, fatigue, fever and myalgias   Associated symptoms: no chest pain, no congestion, no headaches, no nausea, no rhinorrhea, no shortness of breath, no vomiting and no wheezing        Past Medical History:  Diagnosis Date  . Bacteremia due to Gram-negative bacteria 05/23/2011  . Blind    right eye  . Blind right eye   . CHF (congestive heart failure) ([place])   . Chronic lower back pain   . Complication of anesthesia    "woke up during OR; I have an extremely high tolerance" (12/11/2011) 1 procedure was graft; the other procedures were procedures that are typically done with sedation.  . DDD (degenerative disc disease), cervical   . Depression   . Dysrhythmia    "tachycardia" (12/11/2011) new onset afib 10/15/14 EKG  . E coli bacteremia 06/18/2011  . Elevated LDL cholesterol level 12/2018  . ESRD (end stage renal disease) ([place]) 06/12/2011  . Fibromyalgia   . Gastroparesis   . Gastroparesis   . GERD (gastroesophageal reflux disease)   . Glaucoma    right eye  . Gout   . H/O carpal tunnel syndrome   . Headache(784.0)    "not often anymore" (12/11/2011)  . Herpes genitalia 1994  . History of blood transfusion    "more than a few  times" (12/11/2011)  . History of stomach ulcers   . Hypotension   . Iron deficiency anemia   . New onset a-fib ([place])    10/15/14 EKG  . Osteopenia   . Peripheral neuropathy 11/2018  . Pressure ulcer of sacral region, stage 1 07/2019  . Seizures ([place]) 1994   "post transplant; only have had that one" (12/11/2011)  . Spinal stenosis in cervical region   . Stroke [facility])     left basal ganglia lacunar infarct; Right frontal lobe lacunar infarct.  . Stroke [facility]) ~ 1999; 2001   "briefly lost my vision; lost my right eye" (12/11/2011)  . Vitamin D deficiency 12/2018    Patient Active Problem List   Diagnosis Date Noted  . Sacral ulcer, limited to breakdown of skin ([place]) 07/24/2019  . Chest congestion 07/24/2019  . Itching 07/24/2019  . Weakness 06/13/2019  . Pneumonia due to COVID-19 virus 05/09/2019  . Hypotension 05/09/2019  . Symptomatic anemia 05/09/2019  . CHF (congestive heart failure) ([place]) 05/09/2019  . Swollen abdomen 01/04/2019  . DDD (degenerative disc disease), cervical 12/06/2018  . Neuropathy 12/06/2018  . Paresthesia 10/11/2018  . Neck pain 10/11/2018  . Tobacco abuse 11/20/2017  . Right leg swelling 11/20/2017  . CKD (chronic kidney disease), stage III 11/20/2017  . Right leg pain 11/20/2017  .  Stroke (cerebrum) ([place]) 11/20/2017  . GERD (gastroesophageal reflux disease) 11/20/2017  . Acute venous embolism and thrombosis of deep vessels of proximal lower extremity, right ([place]) 11/20/2017  . 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 Thrush, oral   . AKI (acute kidney injury) ([place])   . Multifocal pneumonia 08/09/2017  . Chest pain 09/29/2015  . Cervical stenosis of spinal canal 01/08/2015  . Urinary tract infectious disease   . Muscle spasms of neck 06/15/2014  . Bleeding hemorrhoid 06/15/2014  . Anemia in chronic kidney disease 06/15/2014  . Pyelonephritis, acute 06/13/2014  . Sepsis ([place]) 06/13/2014  . History of kidney  transplant   . Chronic pain syndrome 04/09/2012  . Dehydration, mild 04/09/2012  . Gout attack 06/23/2011  . Herpes infection 06/23/2011  . Anxiety 06/23/2011  . E coli bacteremia 06/18/2011  . ESRD (end stage renal disease) ([place]) 06/12/2011  . UTI (lower urinary tract infection) 06/12/2011  . Bacteremia due to Gram-negative bacteria 05/23/2011  . History of renal transplantation 05/22/2011  . Septic shock(785.52) 05/22/2011  . Acute on chronic kidney failure ([place]) 05/22/2011  . Gastroparesis 04/24/2008  . WEIGHT LOSS 08/24/2007  . NAUSEA AND VOMITING 08/24/2007    Past Surgical History:  Procedure Laterality Date  . ANTERIOR CERVICAL DECOMP/DISCECTOMY FUSION N/A 01/08/2015   Procedure: Anterior Cervical Three-Four/Four-Five Decompression/Diskectomy/Fusion;  Surgeon: Leeroy Cha, MD;  Location: [facility] NEURO ORS;  Service: Neurosurgery;  Laterality: N/A;  C3-4 C4-5 Anterior cervical decompression/diskectomy/fusion  . APPENDECTOMY  ~ 2004  . CATARACT EXTRACTION     right eye  . COLONOSCOPY    . ENUCLEATION  2001   "right"  . ESOPHAGOGASTRODUODENOSCOPY (EGD) WITH PROPOFOL N/A 04/21/2012   Procedure: ESOPHAGOGASTRODUODENOSCOPY (EGD) WITH PROPOFOL;  Surgeon: Milus Banister, MD;  Location: [facility] ENDOSCOPY;  Service: Endoscopy;  Laterality: N/A;  . 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 IR FLUORO GUIDE CV LINE RIGHT  07/11/2019  . IR RADIOLOGY PERIPHERAL GUIDED IV START  07/11/2019  . IR US GUIDE VASC ACCESS RIGHT  07/11/2019  . IR US GUIDE VASC ACCESS RIGHT  07/11/2019  . IR US GUIDE VASC ACCESS RIGHT  07/11/2019  . [place]; 1999; 2005   "right"  . MULTIPLE TOOTH EXTRACTIONS    . RIGHT HEART CATH N/A 03/23/2019   Procedure: RIGHT HEART CATH;  Surgeon: Jolaine Artist, MD;  Location: [facility] CV LAB;  Service: Cardiovascular;  Laterality: N/A;  . TONSILLECTOMY    . TOTAL NEPHRECTOMY  1988?; 1994; 2005     OB  History    Gravida  1   Para      Term      Preterm      AB  1   Living        SAB  1   TAB      Ectopic      Multiple      Live Births              Family History  Problem Relation Age of Onset  . Glaucoma Mother   . Pancreatic cancer Father   . Multiple sclerosis Brother   . Hypertension Maternal Grandmother   . Breast cancer Neg Hx     Social History   Tobacco Use  . Smoking status: Current Every Day Smoker    Packs/day: 0.50  Years: 35.00    Pack years: 17.50    Types: Cigarettes  . Smokeless tobacco: Never Used  Vaping Use  . Vaping Use: Never used  Substance Use Topics  . Alcohol use: Yes    Comment: rare  . Drug use: Yes    Types: Marijuana    Comment: occasional use    Home Medications Prior to Admission medications   Medication Sig Start Date End Date Taking? Authorizing Provider  aspirin EC 81 MG tablet Take 81 mg by mouth daily.     [provider]  colchicine 0.6 MG tablet Take 0.5 tablets (0.3 mg total) by mouth daily. Patient not taking: Reported on 07/21/2019 07/17/19   Domenic Polite, MD  Darbepoetin Alfa (ARANESP, ALBUMIN FREE, IJ) Inject as directed every 14 (fourteen) days. Patient not taking: Reported on 07/21/2019    [provider]  diphenhydrAMINE (BENADRYL) 25 mg capsule Take 1 capsule (25 mg total) by mouth every 8 (eight) hours as needed. 07/21/19   [name], FNP  famotidine (PEPCID) 20 MG tablet Take 1 tablet (20 mg total) by mouth 2 (two) times daily. Patient taking differently: Take 20 mg by mouth as needed for heartburn.  06/13/19   [name], FNP  fluticasone (FLONASE) 50 MCG/ACT nasal spray Place 1 spray into both nostrils daily as needed for allergies or rhinitis.    [provider]  gabapentin (NEURONTIN) 100 MG capsule Take 100 mg by mouth 2 (two) times daily.  09/27/18   [provider]  latanoprost (XALATAN) 0.005 % ophthalmic solution Place 1 drop into the left  eye at bedtime. 03/30/17   [provider]  lidocaine (LIDODERM) 5 % Place 1 patch onto the skin daily as needed (for pain- remove old patch first).  10/28/18   [provider]  midodrine (PROAMATINE) 5 MG tablet Take 2 tablets (10 mg total) by mouth 2 (two) times daily with a meal. 07/17/19   Domenic Polite, MD  nystatin (MYCOSTATIN) 100000 UNIT/ML suspension Use as directed 5 mLs in the mouth or throat daily as needed (FOR THRUSH).     [provider]  ondansetron (ZOFRAN-ODT) 4 MG disintegrating tablet Take 1 tablet (4 mg total) by mouth every 8 (eight) hours as needed for nausea or vomiting. 06/13/19   [name], FNP  oxyCODONE-acetaminophen (PERCOCET) 10-325 MG tablet Take 1 tablet by mouth See admin instructions. Take 1 tablet by mouth every four to six hours as needed for pain    [provider]  predniSONE (DELTASONE) 20 MG tablet Take 0.5-2 tablets (10-40 mg total) by mouth daily. Take 40mg  daily for 2days then 30mg  daily for 2days then 10mg  daily for 2days then back to basal dose of 5mg  daily -long term 07/17/19   Domenic Polite, MD  PROVENTIL HFA 108 [phone] Base) MCG/ACT inhaler Inhale 2 puffs into the lungs 4 (four) times daily as needed for shortness of breath. Patient not taking: Reported on 07/21/2019 08/04/17   [provider]  tacrolimus (PROGRAF) 1 MG capsule Take 2-3 mg by mouth See admin instructions. Take 3 mg by mouth in the morning and 2 mg at bedtime 06/22/11   [name], MD  Vitamin D, Ergocalciferol, (DRISDOL) 1.25 MG (50000 UNIT) CAPS capsule Take 50,000 Units by mouth every Sunday.     [provider]  zolpidem (AMBIEN) 10 MG tablet Take 10 mg by mouth at bedtime as needed for sleep.    [provider]  Allergies    Levofloxacin and Tape  Review of Systems   Review of Systems  Constitutional: Positive for fatigue and fever. Negative for chills.  HENT: Negative for congestion and rhinorrhea.    Eyes: Negative for redness and visual disturbance.  Respiratory: Positive for cough. Negative for shortness of breath and wheezing.   Cardiovascular: Negative for chest pain and palpitations.  Gastrointestinal: Negative for nausea and vomiting.  Genitourinary: Negative for dysuria and urgency.  Musculoskeletal: Positive for arthralgias, joint swelling and myalgias.  Skin: Negative for pallor and wound.  Neurological: Negative for dizziness and headaches.    Physical Exam Updated Vital Signs BP 93/63   Pulse 91   Temp 99.8 F (37.7 C) (Oral)   Resp 19   SpO2 99%   Physical Exam Vitals and nursing note reviewed.  Constitutional:      General: She is not in acute distress.    Appearance: She is well-developed. She is not diaphoretic.     Comments: Chronically ill-appearing  HENT:     Head: Normocephalic and atraumatic.  Eyes:     Pupils: Pupils are equal, round, and reactive to light.  Cardiovascular:     Rate and Rhythm: Normal rate and regular rhythm.     Heart sounds: No murmur heard.  No friction rub. No gallop.   Pulmonary:     Effort: Pulmonary effort is normal.     Breath sounds: No wheezing or rales.  Abdominal:     General: There is no distension.     Palpations: Abdomen is soft.     Tenderness: There is no abdominal tenderness.  Genitourinary:    Comments: Central line to the right groin without obvious tenderness drainage. Musculoskeletal:        General: No tenderness.     Cervical back: Normal range of motion and neck supple.  Skin:    General: Skin is warm and dry.     Findings: Erythema present.     Comments: Anasarca.  Mildly worse to the right upper extremity compared to the left.  Some erythema and warmth to the right dorsal aspect of the hand.  Severe pain with palpation.  Diffusely tender on exam.  Skin is hot to touch diffusely  Neurological:     Mental Status: She is alert and oriented to person, place, and time.     GCS: GCS eye subscore is 4.  GCS verbal subscore is 4. GCS motor subscore is 6.  Psychiatric:        Behavior: Behavior normal.     ED Results / Procedures / Treatments   Labs (all labs ordered are listed, but only abnormal results are displayed) Labs Reviewed  COMPREHENSIVE METABOLIC PANEL - Abnormal; Notable for the following components:      Result Value   BUN 31 (*)    Creatinine, Ser 4.60 (*)    Calcium 8.7 (*)    Total Protein 5.4 (*)    Albumin 3.1 (*)    Total Bilirubin 2.5 (*)    GFR calc non Af Amer 10 (*)    GFR calc Af Amer 12 (*)    All other components within normal limits  CBC WITH DIFFERENTIAL/PLATELET - Abnormal; Notable for the following components:   WBC 11.9 (*)    RBC 2.64 (*)    Hemoglobin 9.0 (*)    HCT 28.9 (*)    MCV 109.5 (*)    MCH 34.1 (*)    RDW 22.5 (*)    Platelets  89 (*)    Neutro Abs 10.2 (*)    Lymphs Abs 0.3 (*)    Monocytes Absolute 1.2 (*)    Abs Immature Granulocytes 0.12 (*)    All other components within normal limits  CULTURE, BLOOD (ROUTINE X 2)  CULTURE, BLOOD (ROUTINE X 2)  URINE CULTURE  LACTIC ACID, PLASMA  URINALYSIS, ROUTINE W REFLEX MICROSCOPIC  I-STAT BETA HCG BLOOD, ED ([facility], [facility], [facility] ONLY)    EKG EKG Interpretation  Date/Time:  Saturday August 05 2019 11:22:38 EDT Ventricular Rate:  94 PR Interval:    QRS Duration: 80 QT Interval:  373 QTC Calculation: 467 R Axis:   39 Text Interpretation: Sinus rhythm Low voltage, precordial leads Abnormal R-wave progression, early transition Since last tracing rate faster Otherwise no significant change Confirmed by Deno Etienne [phone]) on 08/05/2019 12:12:14 PM   Radiology DG Chest Port 1 View  Result Date: 08/05/2019 CLINICAL DATA:  Fever. Dialysis in the RIGHT leg. Swelling of the RIGHT leg yesterday with progressing pain today. Patient missed dialysis today. EXAM: PORTABLE CHEST 1 VIEW COMPARISON:  07/07/2019 FINDINGS: Heart size is within normal limits. Interval development of LEFT LOWER lobe  consolidation/infiltrate with associated air bronchograms. Increased LEFT pleural effusion. Prominent interstitial markings are consistent with mild pulmonary edema and are increased. SVC stent. IMPRESSION: 1. Interval development of LEFT LOWER lobe consolidation/infiltrate and LEFT pleural effusion. 2. Increased interstitial pulmonary edema. Electronically Signed   By: Nolon Nations M.D.   On: 08/05/2019 12:53    Procedures Procedures (including critical care time) Procedure note: Ultrasound Guided Peripheral IV Ultrasound guided peripheral 1.88 inch angiocath IV placement performed by me. Indications: Nursing unable to place IV. Details: The antecubital fossa and upper arm were evaluated with a multifrequency linear probe. Patent brachial veins were noted. 1 attempt was made to cannulate a vein under realtime US guidance with successful cannulation of the vein and catheter placement. There is return of non-pulsatile dark red blood. The patient tolerated the procedure well without complications. Images archived electronically.  CPT codes: [phone] and [phone] Procedure note: Ultrasound Guided Peripheral IV Ultrasound guided peripheral 1.88 inch angiocath IV placement performed by me. Indications: Nursing unable to place IV. Details: The antecubital fossa and upper arm were evaluated with a multifrequency linear probe. Patent brachial veins were noted. 1 attempt was made to cannulate a vein under realtime US guidance with successful cannulation of the vein and catheter placement. There is return of non-pulsatile dark red blood. The patient tolerated the procedure well without complications. Images archived electronically.  CPT codes: [phone] and [phone]  Medications Ordered in ED Medications  vancomycin (VANCOCIN) IVPB 1000 mg/200 mL premix (1,000 mg Intravenous New Bag/Given 08/05/19 1237)  HYDROmorphone (DILAUDID) injection 0.5 mg (has no administration in time range)  piperacillin-tazobactam (ZOSYN)  IVPB 3.375 g (3.375 g Intravenous New Bag/Given 08/05/19 1239)  HYDROmorphone (DILAUDID) injection 0.5 mg (0.5 mg Intravenous Given 08/05/19 1235)  ondansetron (ZOFRAN) injection 4 mg (4 mg Intravenous Given 08/05/19 1233)  sodium chloride 0.9 % bolus 250 mL (250 mLs Intravenous New Bag/Given 08/05/19 1233)    ED Course  I have reviewed the triage vital signs and the nursing notes.  Pertinent labs & imaging results that were available during my care of the patient were reviewed by me and considered in my medical decision making (see chart for details).    MDM Rules/Calculators/A&P  54 yo F with a cc diffuse pain all over and a fever.  Patient was just in the hospital a month ago with failure of her renal transplant and started on dialysis.  The family states that she has had some diffuse pains for a while patient thinks it has been at least a week.  Developing fevers with this has had a consistent cough.  Will obtain a laboratory evaluation chest x-ray.  Patient was made a code sepsis initially with soft pressure and fever.  Chest x-ray with a possible left lower lobe pneumonia.  Will discuss with medicine for admission.  I did not give the patient 30 cc/kg of IV fluids as she was diffusely edematous and never had a blood pressure with a MAP below 65 nor an elevated lactate.  CRITICAL CARE Performed by: Cecilio Asper   Total critical care time: 35 minutes  Critical care time was exclusive of separately billable procedures and treating other patients.  Critical care was necessary to treat or prevent imminent or life-threatening deterioration.  Critical care was time spent personally by me on the following activities: development of treatment plan with patient and/or surrogate as well as nursing, discussions with consultants, evaluation of patient's response to treatment, examination of patient, obtaining history from patient or surrogate, ordering and performing  treatments and interventions, ordering and review of laboratory studies, ordering and review of radiographic studies, pulse oximetry and re-evaluation of patient's condition.  The patients results and plan were reviewed and discussed.   Any x-rays performed were independently reviewed by myself.   Differential diagnosis were considered with the presenting HPI.  Medications  vancomycin (VANCOCIN) IVPB 1000 mg/200 mL premix (1,000 mg Intravenous New Bag/Given 08/05/19 1237)  HYDROmorphone (DILAUDID) injection 0.5 mg (has no administration in time range)  piperacillin-tazobactam (ZOSYN) IVPB 3.375 g (3.375 g Intravenous New Bag/Given 08/05/19 1239)  HYDROmorphone (DILAUDID) injection 0.5 mg (0.5 mg Intravenous Given 08/05/19 1235)  ondansetron (ZOFRAN) injection 4 mg (4 mg Intravenous Given 08/05/19 1233)  sodium chloride 0.9 % bolus 250 mL (250 mLs Intravenous New Bag/Given 08/05/19 1233)    Vitals:   08/05/19 1134 08/05/19 1136  BP:  93/63  Pulse: 91   Resp: 19   Temp:  99.8 F (37.7 C)  TempSrc:  Oral  SpO2: 99%     Final diagnoses:  HCAP (healthcare-associated pneumonia)    Admission/ observation were discussed with the admitting physician, patient and/or family and they are comfortable with the plan.   Final Clinical Impression(s) / ED Diagnoses Final diagnoses:  HCAP (healthcare-associated pneumonia)    Rx / DC Orders ED Discharge Orders    None       Deno Etienne, DO 08/05/19 1327

## 2019-08-05 NOTE — H&P (Addendum)
History and Physical    Tina Watkins POE:[id number] DOB: 1965-12-11 DOA: 08/05/2019  PCP: [name], FNP (Confirm with patient/family/NH records and if not entered, this has to be entered at [facility] point of entry) Patient coming from: Home  I have personally briefly reviewed patient's old medical records in [place]  Chief Complaint: Fever, multiple joint pain, weakness  HPI: Tina Watkins is a 54 y.o. female with medical history significant of ESRD status post renal transplant complicated by CKD stage IV on chronic steroids and immunosuppressive therapy, chronic anemia 2/2 CKD, hypertension, TIA, chronic diastolic congestive heart failure, moderate pulmonary hypertension, presented with increasing multiple joint pain, which significantly limited patient's mobility.  Symptoms started about 1 week ago, initially involved the right wrist and several fingers than progressed to right elbow and bilateral knees and ankles.  She also had low subjective fever.  Meantime she also developed productive cough the last couple of days, sometimes cough up yellowish sputum and started to feel short of breath since yesterday.  She has kidney transplant, unfortunately her kidney function continued to deteriorate this year, on last admission in June hemodialysis was restarted.  However due to access issue, there was no upper body access for temporary catheter, right-sided femoral HD catheter was inserted and was continuously used for dialysis since June.  She says she still makes urine, and she denied any dysuria, no diarrhea. ED Course: X-ray suspect left lower lobe pneumonia.  WBC 11.8 compared to 1.8 2 weeks ago.  Review of Systems: As per HPI otherwise 10 point review of systems negative.    Past Medical History:  Diagnosis Date  . Bacteremia due to Gram-negative bacteria 05/23/2011  . Blind    right eye  . Blind right eye   . CHF (congestive heart failure) ([place])   .  Chronic lower back pain   . Complication of anesthesia    "woke up during OR; I have an extremely high tolerance" (12/11/2011) 1 procedure was graft; the other procedures were procedures that are typically done with sedation.  . DDD (degenerative disc disease), cervical   . Depression   . Dysrhythmia    "tachycardia" (12/11/2011) new onset afib 10/15/14 EKG  . E coli bacteremia 06/18/2011  . Elevated LDL cholesterol level 12/2018  . ESRD (end stage renal disease) ([place]) 06/12/2011  . Fibromyalgia   . Gastroparesis   . Gastroparesis   . GERD (gastroesophageal reflux disease)   . Glaucoma    right eye  . Gout   . H/O carpal tunnel syndrome   . Headache(784.0)    "not often anymore" (12/11/2011)  . Herpes genitalia 1994  . History of blood transfusion    "more than a few times" (12/11/2011)  . History of stomach ulcers   . Hypotension   . Iron deficiency anemia   . New onset a-fib ([place])    10/15/14 EKG  . Osteopenia   . Peripheral neuropathy 11/2018  . Pressure ulcer of sacral region, stage 1 07/2019  . Seizures ([place]) 1994   "post transplant; only have had that one" (12/11/2011)  . Spinal stenosis in cervical region   . Stroke [facility])     left basal ganglia lacunar infarct; Right frontal lobe lacunar infarct.  . Stroke [facility]) ~ 1999; 2001   "briefly lost my vision; lost my right eye" (12/11/2011)  . Vitamin D deficiency 12/2018    Past Surgical History:  Procedure Laterality Date  . ANTERIOR CERVICAL DECOMP/DISCECTOMY FUSION N/A 01/08/2015  Procedure: Anterior Cervical Three-Four/Four-Five Decompression/Diskectomy/Fusion;  Surgeon: Leeroy Cha, MD;  Location: [place] NEURO ORS;  Service: Neurosurgery;  Laterality: N/A;  C3-4 C4-5 Anterior cervical decompression/diskectomy/fusion  . APPENDECTOMY  ~ 2004  . CATARACT EXTRACTION     right eye  . COLONOSCOPY    . ENUCLEATION  2001   "right"  . ESOPHAGOGASTRODUODENOSCOPY (EGD) WITH PROPOFOL N/A 04/21/2012   Procedure:  ESOPHAGOGASTRODUODENOSCOPY (EGD) WITH PROPOFOL;  Surgeon: Milus Banister, MD;  Location: [facility] ENDOSCOPY;  Service: Endoscopy;  Laterality: N/A;  . 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 IR FLUORO GUIDE CV LINE RIGHT  07/11/2019  . IR RADIOLOGY PERIPHERAL GUIDED IV START  07/11/2019  . IR US GUIDE VASC ACCESS RIGHT  07/11/2019  . IR US GUIDE VASC ACCESS RIGHT  07/11/2019  . IR US GUIDE VASC ACCESS RIGHT  07/11/2019  . [place]; 1999; 2005   "right"  . MULTIPLE TOOTH EXTRACTIONS    . RIGHT HEART CATH N/A 03/23/2019   Procedure: RIGHT HEART CATH;  Surgeon: Jolaine Artist, MD;  Location: [place] CV LAB;  Service: Cardiovascular;  Laterality: N/A;  . TONSILLECTOMY    . [place]?; 1994; 2005     reports that she has been smoking cigarettes. She has a 17.50 pack-year smoking history. She has never used smokeless tobacco. She reports current alcohol use. She reports current drug use. Drug: Marijuana.  Allergies  Allergen Reactions  . Levofloxacin Itching and Rash  . Tape Other (See Comments)    "Certain surgical tapes peel off my skin"    Family History  Problem Relation Age of Onset  . Glaucoma Mother   . Pancreatic cancer Father   . Multiple sclerosis Brother   . Hypertension Maternal Grandmother   . Breast cancer Neg Hx      Prior to Admission medications   Medication Sig Start Date End Date Taking? Authorizing Provider  aspirin EC 81 MG tablet Take 81 mg by mouth daily.    Yes [provider]  diphenhydrAMINE (BENADRYL) 25 mg capsule Take 1 capsule (25 mg total) by mouth every 8 (eight) hours as needed. Patient taking differently: Take 25 mg by mouth every 8 (eight) hours as needed for itching.  07/21/19  Yes [name], FNP  famotidine (PEPCID) 20 MG tablet Take 1 tablet (20 mg total) by mouth 2 (two) times daily. 06/13/19  Yes [name], FNP  fluticasone (FLONASE)  50 MCG/ACT nasal spray Place 1 spray into both nostrils daily as needed for allergies or rhinitis.   Yes [provider]  gabapentin (NEURONTIN) 100 MG capsule Take 100 mg by mouth 2 (two) times daily.  09/27/18  Yes [provider]  latanoprost (XALATAN) 0.005 % ophthalmic solution Place 1 drop into the left eye at bedtime. 03/30/17  Yes [provider]  lidocaine (LIDODERM) 5 % Place 1 patch onto the skin daily as needed (for pain- remove old patch first).  10/28/18  Yes [provider]  midodrine (PROAMATINE) 5 MG tablet Take 2 tablets (10 mg total) by mouth 2 (two) times daily with a meal. Patient taking differently: Take 5 mg by mouth 2 (two) times daily with a meal.  07/17/19  Yes Domenic Polite, MD  nystatin (MYCOSTATIN) 100000 UNIT/ML suspension Use as directed 5 mLs in the mouth or throat daily as needed (FOR THRUSH).    Yes  [provider]  ondansetron (ZOFRAN-ODT) 4 MG disintegrating tablet Take 1 tablet (4 mg total) by mouth every 8 (eight) hours as needed for nausea or vomiting. 06/13/19  Yes [name], FNP  oxyCODONE-acetaminophen (PERCOCET) 10-325 MG tablet Take 1 tablet by mouth See admin instructions. Take 1 tablet by mouth every four to six hours as needed for pain   Yes [provider]  predniSONE (DELTASONE) 20 MG tablet Take 0.5-2 tablets (10-40 mg total) by mouth daily. Take 40mg  daily for 2days then 30mg  daily for 2days then 10mg  daily for 2days then back to basal dose of 5mg  daily -long term Patient taking differently: Take 20 mg by mouth daily with breakfast.  07/17/19  Yes Domenic Polite, MD  PROVENTIL HFA 108 [phone] Base) MCG/ACT inhaler Inhale 2 puffs into the lungs 4 (four) times daily as needed for shortness of breath.  08/04/17  Yes [provider]  tacrolimus (PROGRAF) 1 MG capsule Take 2-3 mg by mouth See admin instructions. Take 3 mg by mouth in the morning and 2 mg at bedtime 06/22/11  Yes [name], MD  Vitamin D, Ergocalciferol, (DRISDOL) 1.25 MG (50000 UNIT) CAPS capsule Take 50,000 Units by mouth every Sunday.    Yes [provider]  zolpidem (AMBIEN) 10 MG tablet Take 10 mg by mouth at bedtime as needed for sleep.   Yes [provider]  colchicine 0.6 MG tablet Take 0.5 tablets (0.3 mg total) by mouth daily. Patient not taking: Reported on 07/21/2019 07/17/19   Domenic Polite, MD  Darbepoetin Alfa (ARANESP, ALBUMIN FREE, IJ) Inject as directed every 14 (fourteen) days.  Patient not taking: Reported on 08/05/2019    [provider]    Physical Exam: Vitals:   08/05/19 1134 08/05/19 1136 08/05/19 1430  BP:  93/63 99/76  Pulse: 91  95  Resp: 19  20  Temp:  99.8 F (37.7 C)   TempSrc:  Oral   SpO2: 99%  100%    Constitutional: NAD, calm, comfortable Vitals:   08/05/19 1134 08/05/19 1136 08/05/19 1430  BP:  93/63 99/76  Pulse: 91  95  Resp: 19  20  Temp:  99.8 F (37.7 C)   TempSrc:  Oral   SpO2: 99%  100%   Eyes: PERRL, lids and conjunctivae normal ENMT: Mucous membranes are moist. Posterior pharynx clear of any exudate or lesions.Normal dentition.  Neck: normal, supple, no masses, no thyromegaly Respiratory: clear to auscultation bilaterally, no wheezing, no crackles. Normal respiratory effort. No accessory muscle use.  Cardiovascular: Regular rate and rhythm, no murmurs / rubs / gallops. No extremity edema. 2+ pedal pulses. No carotid bruits.  Abdomen: no tenderness, no masses palpated. No hepatosplenomegaly. Bowel sounds positive.  Musculoskeletal: no clubbing / cyanosis. No joint deformity upper and lower extremities. Good ROM, no contractures. Normal muscle tone. Swelling of right wrist and fingers and right elbow and right knee. Right femoral catheter insertion site looks clean no significant discharge or swelling or rash. Skin: no rashes, lesions, ulcers. No induration Neurologic: CN 2-12 grossly intact. Sensation intact, DTR normal.  Strength 5/5 in all 4.  Psychiatric: Normal judgment and insight. Alert and oriented x 3. Normal mood.     Labs on Admission: I have personally reviewed following labs and imaging studies  CBC: Recent Labs  Lab 08/05/19 1206  WBC 11.9*  NEUTROABS 10.2*  HGB 9.0*  HCT 28.9*  MCV 109.5*  PLT 89*   Basic Metabolic Panel: Recent Labs  Lab 08/05/19 1206  NA 135  K 4.5  CL 98  CO2 26  GLUCOSE 91  BUN 31*  CREATININE 4.60*  CALCIUM 8.7*   GFR: CrCl cannot be calculated (Unknown ideal weight.). Liver Function Tests: Recent Labs  Lab 08/05/19 1206  AST 25  ALT 25  ALKPHOS 117  BILITOT 2.5*  PROT 5.4*  ALBUMIN 3.1*   No results for input(s): LIPASE, AMYLASE in the last 168 hours. No results for input(s): AMMONIA in the last 168 hours. Coagulation Profile: No results for input(s): INR, PROTIME in the last 168 hours. Cardiac Enzymes: No results for input(s): CKTOTAL, CKMB, CKMBINDEX, TROPONINI in the last 168 hours. BNP (last 3 results) No results for input(s): PROBNP in the last 8760 hours. HbA1C: No results for input(s): HGBA1C in the last 72 hours. CBG: No results for input(s): GLUCAP in the last 168 hours. Lipid Profile: No results for input(s): CHOL, HDL, LDLCALC, TRIG, CHOLHDL, LDLDIRECT in the last 72 hours. Thyroid Function Tests: No results for input(s): TSH, T4TOTAL, FREET4, T3FREE, THYROIDAB in the last 72 hours. Anemia Panel: No results for input(s): VITAMINB12, FOLATE, FERRITIN, TIBC, IRON, RETICCTPCT in the last 72 hours. Urine analysis:    Component Value Date/Time   COLORURINE YELLOW 07/07/2019 [place] 07/07/2019 1715   LABSPEC 1.010 07/07/2019 1715   PHURINE 5.0 07/07/2019 1715   GLUCOSEU NEGATIVE 07/07/2019 1715   HGBUR NEGATIVE 07/07/2019 [place] 07/07/2019 1715   BILIRUBINUR Negative 01/04/2019 1030   [place] 07/07/2019 1715   PROTEINUR NEGATIVE 07/07/2019 1715   UROBILINOGEN 0.2  01/04/2019 1030   UROBILINOGEN 0.2 10/15/2014 0946   NITRITE NEGATIVE 07/07/2019 [place] 07/07/2019 1715    Radiological Exams on Admission: DG Chest Port 1 View  Result Date: 08/05/2019 CLINICAL DATA:  Fever. Dialysis in the RIGHT leg. Swelling of the RIGHT leg yesterday with progressing pain today. Patient missed dialysis today. EXAM: PORTABLE CHEST 1 VIEW COMPARISON:  07/07/2019 FINDINGS: Heart size is within normal limits. Interval development of LEFT LOWER lobe consolidation/infiltrate with associated air bronchograms. Increased LEFT pleural effusion. Prominent interstitial markings are consistent with mild pulmonary edema and are increased. SVC stent. IMPRESSION: 1. Interval development of LEFT LOWER lobe consolidation/infiltrate and LEFT pleural effusion. 2. Increased interstitial pulmonary edema. Electronically Signed   By: Nolon Nations M.D.   On: 08/05/2019 12:53    EKG: Independently reviewed.  Sinus, chronic low voltage EKG  Assessment/Plan Active Problems:   Pneumonia  (please populate well all problems here in Problem List. (For example, if patient is on BP meds at home and you resume or decide to hold them, it is a problem that needs to be her. Same for CAD, COPD, HLD and so on)  Healthcare acquired pneumonia -She has cough, she has new infiltrates on the x-ray, will cover with vancomycin and Zosyn, consult pharmacy -Breathing treatment and guaifenesin -Trend procalcitonin level  CKD stage V on HD -Discussed with on-call nephrologist Dr. Burnett Sheng, given the past history of extremely limited IV access, nephrology not recommending discontinue current femoral line unless we have confirmed evidence patient has bacteremia. -Patient is yet to see vascular surgeon to discuss about permanent access after discharge last month.  Acute gouty flareup -Restart colchicine, increase her steroid to 1 mg/kg gram divided into doses, and just low tapering  afterwards.  Kidney transplant -As per nephrologist from last month, still recommend continue steroid and tacrolimus.  Imuran was discontinued last month due to  worsening of pancytopenia.  Orthostatic hypotension -On midodrine  DVT prophylaxis: Heparin subcu Code Status: Full code Family Communication: None at bedside Disposition Plan: Patient has complicated medical problems came with pneumonia, likely will need more than 2 midnight hospital stay Consults called: Nephro Admission status: Tele admit   Lequita Halt MD [facility] Pager [phone]    08/05/2019, 2:46 PM

## 2019-08-05 NOTE — Progress Notes (Signed)
Pharmacy Antibiotic Note  Tina Watkins is a 54 y.o. female admitted on 08/05/2019 with pneumonia.  Pharmacy has been consulted for Zosyn and vancomycin dosing.   Height: 4\' 11"  (149.9 cm) Weight: 57.9 kg (127 lb 10.3 oz) IBW/kg (Calculated) : 43.2  Temp (24hrs), Avg:99.8 F (37.7 C), Min:99.8 F (37.7 C), Max:99.8 F (37.7 C)  Recent Labs  Lab 08/05/19 1206  WBC 11.9*  CREATININE 4.60*  LATICACIDVEN 1.3    Estimated Creatinine Clearance: 11.2 mL/min (A) (by C-G formula based on SCr of 4.6 mg/dL (H)).    Allergies  Allergen Reactions  . Levofloxacin Itching and Rash  . Tape Other (See Comments)    "Certain surgical tapes peel off my skin"    Antimicrobials this admission: 7/3 Zosyn >>  7/3 Vancomycin >>   Dose adjustments this admission: N/a  Microbiology results: Pending   Plan:  - Zosyn 2.25g IV every 8 hours   - Vancomycin 1000mg  IV x 1 dose given by the ED - Followed by Vancomycin 500mg  IV qTTS after HD - Monitor patients HD sessions and urine output  - De-escalate ABX when appropriate   Thank you for allowing pharmacy to be a part of this patient's care.  Duanne Limerick PharmD. BCPS 08/05/2019 3:22 PM

## 2019-08-05 NOTE — ED Triage Notes (Signed)
Pt bib ems, pt has dialysis in the right leg. Pt started to have swelling in the right leg yesterday with progressing pain today. Pt was supposed to go to dialysis today but misses it. Pt has a fever of 101. hxt of 3x kidney transplants, chf.   Pt aox4 upon arrival.

## 2019-08-06 LAB — URINE CULTURE: Culture: NO GROWTH

## 2019-08-06 LAB — BASIC METABOLIC PANEL
Anion gap: 14 (ref 5–15)
BUN: 14 mg/dL (ref 6–20)
CO2: 23 mmol/L (ref 22–32)
Calcium: 8.2 mg/dL — ABNORMAL LOW (ref 8.9–10.3)
Chloride: 100 mmol/L (ref 98–111)
Creatinine, Ser: 2.41 mg/dL — ABNORMAL HIGH (ref 0.44–1.00)
GFR calc Af Amer: 26 mL/min — ABNORMAL LOW (ref >60)
GFR calc non Af Amer: 23 mL/min — ABNORMAL LOW (ref >60)
Glucose, Bld: 84 mg/dL (ref 70–99)
Potassium: 3.7 mmol/L (ref 3.5–5.1)
Sodium: 137 mmol/L (ref 135–145)

## 2019-08-06 LAB — CBC
HCT: 29.3 % — ABNORMAL LOW (ref 36.0–46.0)
Hemoglobin: 9.2 g/dL — ABNORMAL LOW (ref 12.0–15.0)
MCH: 33.3 pg (ref 26.0–34.0)
MCHC: 31.4 g/dL (ref 30.0–36.0)
MCV: 106.2 fL — ABNORMAL HIGH (ref 80.0–100.0)
Platelets: 71 10*3/uL — ABNORMAL LOW (ref 150–400)
RBC: 2.76 MIL/uL — ABNORMAL LOW (ref 3.87–5.11)
RDW: 22 % — ABNORMAL HIGH (ref 11.5–15.5)
WBC: 14.4 10*3/uL — ABNORMAL HIGH (ref 4.0–10.5)
nRBC: 0 % (ref 0.0–0.2)

## 2019-08-06 LAB — FOLATE: Folate: 7.2 ng/mL (ref >5.9)

## 2019-08-06 LAB — VITAMIN B12: Vitamin B-12: 744 pg/mL (ref 180–914)

## 2019-08-06 LAB — PROCALCITONIN: Procalcitonin: 5.49 ng/mL

## 2019-08-06 MED ORDER — CHLORHEXIDINE GLUCONATE CLOTH 2 % EX PADS: 6.0000 | MEDICATED_PAD | Freq: Every day | CUTANEOUS | Status: DC

## 2019-08-06 MED ORDER — HEPARIN SODIUM (PORCINE) 1000 UNIT/ML IJ SOLN
INTRAMUSCULAR | Status: AC
  Filled 2019-08-06: qty 4

## 2019-08-06 MED ORDER — COLCHICINE 0.3 MG HALF TABLET
0.3000 mg | ORAL_TABLET | Freq: Every day | ORAL | Status: DC
  Administered 2019-08-06 – 2019-08-21 (×15): 0.3 mg via ORAL
  Filled 2019-08-06 (×6): qty 1
  Filled 2019-08-06 (×2): qty 0.5
  Filled 2019-08-06 (×2): qty 1
  Filled 2019-08-06 (×2): qty 0.5
  Filled 2019-08-06 (×4): qty 1
  Filled 2019-08-06: qty 0.5
  Filled 2019-08-06: qty 1
  Filled 2019-08-06: qty 0.5

## 2019-08-06 NOTE — Evaluation (Signed)
Physical Therapy Evaluation Patient Details Name: Tina Watkins MRN: [id number] DOB: 23-Jul-1965 Today's Date: 08/06/2019   History of Present Illness  Patient is a 28-year female with history of ESRD status post renal transplant complicated by CKD stage IV on chronic steroids and immunosuppressive therapy, chronic anemia secondary to CKD, hypertension, TIA, chronic diastolic congestive heart failure, moderate pulmonary hypertension who presented with multiple joints pain which significantly limited her mobility.  Recently re-started on HD due to kidney failure, now with R femoral HD cath.  Clinical Impression  Patient presents with decreased mobility due to severe gout pain in LE's and R hand.  She pushed through enough to sit EOB and to attempt standing with RW x 3-4 able to get on bedpan and off and have assist for hygiene.  She lives alone, but reports uncle can assist.  Feel she should progress to be able to go home with follow up HHPT.  PT to follow acutely.     Follow Up Recommendations Home health PT;Supervision/Assistance - 24 hour    Equipment Recommendations  None recommended by PT (refusing w/c)    Recommendations for Other Services       Precautions / Restrictions Precautions Precautions: Fall Precaution Comments: R femoral HD cath (RN and pt report okay to sit)      Mobility  Bed Mobility Overal bed mobility: Needs Assistance Bed Mobility: Supine to Sit;Sit to Supine     Supine to sit: Mod assist;HOB elevated Sit to supine: Mod assist   General bed mobility comments: assisted with R LE to EOB and lifting trunk, to supine assist for feet in bed  Transfers Overall transfer level: Needs assistance Equipment used: Rolling walker (2 wheeled) Transfers: Lateral/Scoot Transfers;Sit to/from Stand Sit to Stand: Supervision        Lateral/Scoot Transfers: Min assist General transfer comment: sit to squat onto walker for changing bed pad then to place bed pan  then for hygiene and bed pan removal, pulls up on walker enough to lift her hips, but unable to straighten up; scooting up toward [facility] using bed pad under her  Ambulation/Gait             General Gait Details: unable  Stairs            Wheelchair Mobility    Modified Rankin (Stroke Patients Only)       Balance Overall balance assessment: Needs assistance   Sitting balance-Leahy Scale: Good Sitting balance - Comments: sitting reaching to feet with feet unsupported to apply cream to skin   Standing balance support: Bilateral upper extremity supported Standing balance-Leahy Scale: Poor Standing balance comment: barely able to clear hips from bed with heavy UE reliance                             Pertinent Vitals/Pain Pain Assessment: Faces Faces Pain Scale: Hurts worst Pain Location: gout flare, bilat feet, R worse than L and R knee, R hand (thumb CMC) Pain Descriptors / Indicators: Aching;Sharp;Discomfort Pain Intervention(s): Repositioned    Home Living Family/patient expects to be discharged to:: Private residence   Available Help at Discharge: Family;Available PRN/intermittently (uncle) Type of Home: Apartment Home Access: Stairs to enter Entrance Stairs-Rails: Can reach both Entrance Stairs-Number of Steps: 6 Home Layout: One level Home Equipment: Walker - 2 wheels;Cane - single point Additional Comments: Reports uncle can assist when she needs    Prior Function Level of Independence: Independent with assistive device(s)  Comments: sponge bathing     Hand Dominance   Dominant Hand: Right    Extremity/Trunk Assessment   Upper Extremity Assessment Upper Extremity Assessment: RUE deficits/detail RUE Deficits / Details: moving thumb CMC and fingers minimally, edema throughout and exquisitely painful to touch, elbow and shoulder WFL, wrist stiff and mildly sore    Lower Extremity Assessment Lower Extremity Assessment: LLE  deficits/detail;RLE deficits/detail RLE Deficits / Details: AROM difficult due to knee pain, helps it to move with her hands, ankle stiff painful and very tender to touch edema in knee and some in foot LLE Deficits / Details: AROM knee WFL, limited and painful at ankle with some edema in foot    Cervical / Trunk Assessment Cervical / Trunk Assessment: Kyphotic  Communication   Communication: No difficulties  Cognition Arousal/Alertness: Awake/alert Behavior During Therapy: WFL for tasks assessed/performed Overall Cognitive Status: Impaired/Different from baseline                     Current Attention Level: Sustained   Following Commands: Follows one step commands consistently;Follows one step commands inconsistently Safety/Judgement: Decreased awareness of safety;Decreased awareness of deficits   Problem Solving: Slow processing;Difficulty sequencing;Requires verbal cues General Comments: phases in and out and asks if she fell asleep, needs extra time for all mobility and chides herself when she says she can't do something      General Comments General comments (skin integrity, edema, etc.): noted skin tear on sacrum and placed small mepilex pad after hygiene after toileting    Exercises     Assessment/Plan    PT Assessment Patient needs continued PT services  PT Problem List Decreased range of motion;Decreased activity tolerance;Decreased balance;Decreased knowledge of use of DME;Pain;Decreased safety awareness;Decreased mobility       PT Treatment Interventions DME instruction;Therapeutic activities;Gait training;Therapeutic exercise;Patient/family education;Stair training;Functional mobility training    PT Goals (Current goals can be found in the Care Plan section)  Acute Rehab PT Goals Patient Stated Goal: to go home PT Goal Formulation: With patient Time For Goal Achievement: 08/20/19    Frequency Min 3X/week   Barriers to discharge         Co-evaluation               AM-PAC PT "6 Clicks" Mobility  Outcome Measure Help needed turning from your back to your side while in a flat bed without using bedrails?: A Little Help needed moving from lying on your back to sitting on the side of a flat bed without using bedrails?: A Little Help needed moving to and from a bed to a chair (including a wheelchair)?: A Little Help needed standing up from a chair using your arms (e.g., wheelchair or bedside chair)?: A Little Help needed to walk in hospital room?: Total Help needed climbing 3-5 steps with a railing? : Total 6 Click Score: 14    End of Session   Activity Tolerance: Patient limited by pain Patient left: in bed;with call bell/phone within reach   PT Visit Diagnosis: Unsteadiness on feet (R26.81);Pain Pain - Right/Left: Right Pain - part of body: Ankle and joints of foot    Time: 1696-7893 PT Time Calculation (min) (ACUTE ONLY): 63 min   Charges:   PT Evaluation $PT Eval Moderate Complexity: 1 Mod PT Treatments $Therapeutic Activity: 23-37 mins $Self Care/Home Management: [place] YBOFB:[phone] Office:[phone] 08/06/2019   Reginia Naas 08/06/2019, 5:04 PM

## 2019-08-06 NOTE — Progress Notes (Addendum)
PROGRESS NOTE    Tina Watkins  FXT:[id number] DOB: 1965-08-15 DOA: 08/05/2019 PCP: [name], FNP   Brief Narrative: Patient is a 44-year female with history of ESRD status post renal transplant complicated by CKD stage IV on chronic steroids and immunosuppressive therapy, chronic anemia secondary to CKD, hypertension, TIA, chronic diastolic congestive heart failure, moderate pulmonary hypertension who presented with multiple joints pain which significantly limited her mobility.  Symptoms started about a week ago.  It involved right wrist and several fingers and right elbow and bilateral knees and ankles.  She also had low-grade fever.  Patient also developed productive  cough with yellow sputum.  She was started on hemodialysis this June because of failed kidney transplant.She has a right-sided femoral HD catheter for dialysis.  On presentation, chest x-ray showed possible left lower lobe pneumonia.  Admitted for further management.  Assessment & Plan:   Active Problems:   HCAP (healthcare-associated pneumonia)   Healthcare associated pneumonia: Presented with cough, new infiltrate on the chest x-ray.  Started on vancomycin and Zosyn.  Still has some mild grade fever.  Continue bronchodilators as needed, anticough medications.  CKD stage V on hemodialysis: Nephrology following.  She has right-sided femoral dialysis catheter.  Not planning for removing the dialysis catheter unless blood cultures are positive.  She has extremely limited IV access on the upper extremities.  She is following with vascular surgery as an outpatient for permanent access.  Patient still produces urine.  Acute gouty flareup: Presented with pain on the multiple joints.  Atypical presentation.  Colchicine restarted due to severe pain.  Continue steroids.  Dose of steroid has been increased from her baseline dose for gout.  Complains of severe pain today mainly on her left ankle.  She has severe tenderness  on the left ankle.  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  Her nephrologist is still recommending to continue steroid and tacrolimus.   Imuran was discontinued last month due to worsening pancytopenia.  Orthostatic hypotension: On midodrine.  BP soft.  She has chronic hypotension.  Leukocytosis: Leukocytosis most likely associated with her acute gout flareup.  A  Continue to monitor CBC.  Anemia of chronic disease/thrombocytopenia: Associated with CKD.  Currently hemoglobin stable.  She has chronic macrocytosis.  We will check vitamin B12 and folate.  Chronic diastolic heart failure: Volume managed by dialysis.  Last echo on 12/20 showed restrictive cardiomyopathy, grade 3 diastolic dysfunction, preserved ejection fraction, moderate pulmonary hypertension  History of Covid infection: On April 2019.  Presented with Covid pneumonia and had prolonged hospitalization.  Debility/deconditioning: We have requested  physical therapy evaluation         DVT prophylaxis: Heparin subcu Code Status: Full code Family Communication: None present at the bedside Status is: Inpatient  Remains inpatient appropriate because:IV treatments appropriate due to intensity of illness or inability to take PO   Dispo: The patient is from: Home              Anticipated d/c is to: Home vs SNF              Anticipated d/c date is: 2 days              Patient currently is not medically stable to d/c.    Consultants: Nephrology  Procedures: Hemodialysis  Antimicrobials:  Anti-infectives (From admission, onward)   Start     Dose/Rate Route Frequency Ordered Stop   08/08/19 1200  vancomycin (VANCOCIN) IVPB  500 mg/100 ml premix     Discontinue     500 mg 100 mL/hr over 60 Minutes Intravenous Every T-Th-Sa (Hemodialysis) 08/05/19 1522     08/05/19 2030  piperacillin-tazobactam (ZOSYN) IVPB 2.25 g     Discontinue     2.25 g 100 mL/hr over 30 Minutes Intravenous Every 8  hours 08/05/19 1522     08/05/19 1230  vancomycin (VANCOCIN) IVPB 1000 mg/200 mL premix        1,000 mg 200 mL/hr over 60 Minutes Intravenous  Once 08/05/19 1219 08/05/19 1412   08/05/19 1230  piperacillin-tazobactam (ZOSYN) IVPB 3.375 g        3.375 g 100 mL/hr over 30 Minutes Intravenous  Once 08/05/19 1219 08/05/19 1328      Subjective: Patient seen and examined at the bedside this morning.  Hemodynamically stable.  But was in miserable condition due to severe pain.  The pain was mostly in her left ankle.  She has severely inflamed joints on bilateral upper and lower extremities.  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 No JVD, murmurs, rubs, gallops or clicks. No pedal edema. Gastrointestinal system: Abdomen is nondistended, soft and nontender. No organomegaly or masses felt. Normal bowel sounds heard. Central nervous system: Alert and oriented. No focal neurological deficits. Extremities: Trace edema of the right lower extremity, no clubbing ,no cyanosis, tender/swollen left ankle, right hand, temporary dialysis catheter on the right groin skin: No rashes, lesions or ulcers,no icterus ,no pallor   Data Reviewed: I have personally reviewed following labs and imaging studies  CBC: Recent  Labs  Lab 08/05/19 1206  WBC 11.9*  NEUTROABS 10.2*  HGB 9.0*  HCT 28.9*  MCV 109.5*  PLT 89*   Basic Metabolic Panel: Recent Labs  Lab 08/05/19 1206  NA 135  K 4.5  CL 98  CO2 26  GLUCOSE 91  BUN 31*  CREATININE 4.60*  CALCIUM 8.7*   GFR: Estimated Creatinine Clearance: 11.2 mL/min (A) (by C-G formula based on SCr of 4.6 mg/dL (H)). Liver Function Tests: Recent Labs  Lab 08/05/19 1206  AST 25  ALT 25  ALKPHOS 117  BILITOT 2.5*  PROT 5.4*  ALBUMIN 3.1*   No results for input(s): LIPASE, AMYLASE in the last 168 hours. No results for input(s): AMMONIA in the last 168 hours. Coagulation Profile: No results for input(s): INR, PROTIME in the last 168 hours. Cardiac Enzymes: No results for input(s): CKTOTAL, CKMB, CKMBINDEX, TROPONINI in the last 168 hours. BNP (last 3 results) No results for input(s): PROBNP in the last 8760 hours. HbA1C: No results for input(s): HGBA1C in the  last 72 hours. CBG: No results for input(s): GLUCAP in the last 168 hours. Lipid Profile: No results for input(s): CHOL, HDL, LDLCALC, TRIG, CHOLHDL, LDLDIRECT in the last 72 hours. Thyroid Function Tests: No results for input(s): TSH, T4TOTAL, FREET4, T3FREE, THYROIDAB in the last 72 hours. Anemia Panel: No results for input(s): VITAMINB12, FOLATE, FERRITIN, TIBC, IRON, RETICCTPCT in the last 72 hours. Sepsis Labs: Recent Labs  Lab 08/05/19 1206  PROCALCITON 2.81  LATICACIDVEN 1.3    Recent Results (from the past 240 hour(s))  SARS Coronavirus 2 by RT PCR (hospital order, performed in [facility] hospital lab) Nasopharyngeal Nasopharyngeal Swab     Status: None   Collection Time: 08/05/19  8:15 PM   Specimen: Nasopharyngeal Swab  Result Value Ref Range Status   SARS Coronavirus 2 NEGATIVE NEGATIVE Final    Comment: (NOTE) SARS-CoV-2 target nucleic acids are NOT DETECTED.  The SARS-CoV-2 RNA is generally detectable in upper and lower respiratory specimens during the acute phase  of infection. The lowest concentration of SARS-CoV-2 viral copies this assay can detect is 250 copies / mL. A negative result does not preclude SARS-CoV-2 infection and should not be used as the sole basis for treatment or other patient management decisions.  A negative result may occur with improper specimen collection / handling, submission of specimen other than nasopharyngeal swab, presence of viral mutation(s) within the areas targeted by this assay, and inadequate number of viral copies (<250 copies / mL). A negative result must be combined with clinical observations, patient history, and epidemiological information.  Fact Sheet for Patients:   StrictlyIdeas.no  Fact Sheet for Healthcare Providers: BankingDealers.co.za  This test is not yet approved or  cleared by the [place] FDA and has been authorized for detection and/or diagnosis of SARS-CoV-2 by FDA under an Emergency Use Authorization (EUA).  This EUA will remain in effect (meaning this test can be used) for the duration of the COVID-19 declaration under Section 564(b)(1) of the Act, 21 U.S.C. section 360bbb-3(b)(1), unless the authorization is terminated or revoked sooner.  Performed at [facility], [place] [address], [place], [place] [address]          Radiology Studies: DG Chest Port 1 View  Result Date: 08/05/2019 CLINICAL DATA:  Fever. Dialysis in the RIGHT leg. Swelling of the RIGHT leg yesterday with progressing pain today. Patient missed dialysis today. EXAM: PORTABLE CHEST 1 VIEW COMPARISON:  07/07/2019 FINDINGS: Heart size is within normal limits. Interval development of LEFT LOWER lobe consolidation/infiltrate with associated air bronchograms. Increased LEFT pleural effusion. Prominent interstitial markings are consistent with mild pulmonary edema and are increased. SVC stent. IMPRESSION: 1. Interval development of LEFT LOWER lobe  consolidation/infiltrate and LEFT pleural effusion. 2. Increased interstitial pulmonary edema. Electronically Signed   By: Nolon Nations M.D.   On: 08/05/2019 12:53        Scheduled Meds: . aspirin EC  81 mg Oral Daily  . Chlorhexidine Gluconate Cloth  6 each Topical Q0600  . famotidine  20 mg Oral Daily  . gabapentin  100 mg Oral BID  . guaiFENesin  600 mg Oral BID  . heparin  5,000 Units Subcutaneous Q12H  . heparin sodium (porcine)      . latanoprost  1 drop Left Eye QHS  . midodrine  5 mg Oral BID WC  . predniSONE  30 mg Oral BID WC  . tacrolimus  2 mg Oral QHS  . tacrolimus  3 mg Oral Daily  . [START  ON 08/08/2019] vancomycin  500 mg Intravenous Q T,Th,Sa-HD  . Vitamin D (Ergocalciferol)  50,000 Units Oral Q Sun   Continuous Infusions: . piperacillin-tazobactam (ZOSYN)  IV 2.25 g (08/06/19 0756)     LOS: 1 day    Time spent: 35 mins,More than 50% of that time was spent in counseling and/or coordination of care.      [name], MD [facility] P7/05/2019, 8:18 AM

## 2019-08-06 NOTE — Progress Notes (Signed)
Tina Watkins  Subjective: seen in room, still c/o pain, drowsy, no distress. 2 L off w/ HD yest.   Vitals:   08/06/19 0633 08/06/19 0802 08/06/19 1221 08/06/19 1224  BP: (!) 94/41 (!) 94/59 (!) 88/67 91/60  Pulse: (!) 106 69    Resp: (!) 21  13 17   Temp: 98.9 F (37.2 C) 100.1 F (37.8 C) 99.5 F (37.5 C)   TempSrc: Oral Oral Oral   SpO2: 98% 95% 96%   Weight:      Height:        Exam: Gen drowsy, no distress, looks less puffy +JVD looks chronic Chest slightly rales and rhonchi L base, R side clear R breast edema RRR no MRG Abd soft ntnd no mass or ascites +bs, R lower abd wall 1-2+ edema GU defer MS marked erythema/ edema most fingers on R hand, also swelling/ erythema and extreme tenderness R 1st MTP and instep Ext 1+ pretib edema, 1-2+ hip edema Neuro is alert, Ox 3 , nf  R fem TDC exit site clean w/o pus/ erythema    Home meds:  - asa 81/ midodrine 5 mg bid  - prograf 3mg  am + 2mg  pm/ prednisone 20mg  qd  - percocet 10 qid prn/ neurontin 100 bid  - pepcid 20 bid  - nystatin 5 cc prn thrush  - eyedrops/ prn's/ vitamins   CXR 7/3 - IMPRESSION: 1. Interval development of LEFT LOWER lobe consolidation/infiltrate and LEFT pleural effusion. 2. Increased interstitial pulmonary edema.   CXR - to my read no sig IS edema, CXR looks similar to last film except retrocardiac density is increased   Alb 3.1, tbili 2.5 AST/ ALT wnl  K 4.5  BUN 31  Cr 4.6   Hb 9  plt 89k  WBC 11.9    UA - pending    OP HD: [facility] TTS   4h  57.5kg (down to 56 last HD)  2/2 bath Hep 1000  Fem TDC R   - mircera 100 q2 wk last 6/24  - no vdra or Fe    Assessment/ Plan: 1. Fever/ ^wBC - suspected LLL PNA, started on IV vanc/ zosyn. If Vanc is not needed, please dc asap to protect pt's residual transplant renal fxn. Line sepsis unlikely w/ negative blood cx's. Fevers could be gout-related, she has a lot of disease.   2. Gout flare - sig changes R hand/ R foot, per  primary getting colchicine+ pred 3. ESRD / failed renal Tx - TTS HD. Started back on HD 2-3 wks ago here. Had HD yest on schedule. Extra HD Monday for volume.  4. Vol overload - +vol overload/ edema on exam and some early pulm edema by CXR. edema by CXR.  Hypotension is likely long-term as pt on midodrine bid. Short extra HD for further volume reduction tomorrow.  5. 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 Anemia ckd - get records in am 7. MBD ckd - Ca okay, check phos 8. H/o COVID infection - April 2019 here w/ COVID pna and prolonged admit. PCR COVID negative last night.    Tina Watkins 08/06/2019, 1:30 PM   Recent Labs  Lab 08/05/19 1206 08/06/19 0815  K 4.5 3.7  BUN 31* 14  CREATININE 4.60* 2.41*  CALCIUM 8.7* 8.2*  HGB 9.0* 9.2*   Inpatient medications: . aspirin EC  81 mg Oral Daily  . Chlorhexidine Gluconate Cloth  6 each Topical Q0600  .  colchicine  0.3 mg Oral Daily  . famotidine  20 mg Oral Daily  . gabapentin  100 mg Oral BID  . guaiFENesin  600 mg Oral BID  . heparin  5,000 Units Subcutaneous Q12H  . heparin sodium (porcine)      . latanoprost  1 drop Left Eye QHS  . midodrine  5 mg Oral BID WC  . predniSONE  30 mg Oral BID WC  . tacrolimus  2 mg Oral QHS  . tacrolimus  3 mg Oral Daily  . [START ON 08/08/2019] vancomycin  500 mg Intravenous Q T,Th,Sa-HD  . Vitamin D (Ergocalciferol)  50,000 Units Oral Q Sun   . piperacillin-tazobactam (ZOSYN)  IV 2.25 g (08/06/19 0756)   albuterol, albuterol, diphenhydrAMINE, fluticasone, HYDROmorphone (DILAUDID) injection, lidocaine, nystatin, ondansetron, oxyCODONE-acetaminophen **AND** oxyCODONE, zolpidem

## 2019-08-07 LAB — RENAL FUNCTION PANEL
Albumin: 2.5 g/dL — ABNORMAL LOW (ref 3.5–5.0)
Anion gap: 12 (ref 5–15)
BUN: 31 mg/dL — ABNORMAL HIGH (ref 6–20)
CO2: 23 mmol/L (ref 22–32)
Calcium: 8.4 mg/dL — ABNORMAL LOW (ref 8.9–10.3)
Chloride: 101 mmol/L (ref 98–111)
Creatinine, Ser: 4 mg/dL — ABNORMAL HIGH (ref 0.44–1.00)
GFR calc Af Amer: 14 mL/min — ABNORMAL LOW (ref >60)
GFR calc non Af Amer: 12 mL/min — ABNORMAL LOW (ref >60)
Glucose, Bld: 132 mg/dL — ABNORMAL HIGH (ref 70–99)
Phosphorus: 5.5 mg/dL — ABNORMAL HIGH (ref 2.5–4.6)
Potassium: 4.3 mmol/L (ref 3.5–5.1)
Sodium: 136 mmol/L (ref 135–145)

## 2019-08-07 LAB — CBC WITH DIFFERENTIAL/PLATELET
Abs Immature Granulocytes: 0.12 10*3/uL — ABNORMAL HIGH (ref 0.00–0.07)
Basophils Absolute: 0 10*3/uL (ref 0.0–0.1)
Basophils Relative: 0 %
Eosinophils Absolute: 0 10*3/uL (ref 0.0–0.5)
Eosinophils Relative: 0 %
HCT: 29.7 % — ABNORMAL LOW (ref 36.0–46.0)
Hemoglobin: 9.5 g/dL — ABNORMAL LOW (ref 12.0–15.0)
Immature Granulocytes: 1 %
Lymphocytes Relative: 2 %
Lymphs Abs: 0.2 10*3/uL — ABNORMAL LOW (ref 0.7–4.0)
MCH: 34.4 pg — ABNORMAL HIGH (ref 26.0–34.0)
MCHC: 32 g/dL (ref 30.0–36.0)
MCV: 107.6 fL — ABNORMAL HIGH (ref 80.0–100.0)
Monocytes Absolute: 0.8 10*3/uL (ref 0.1–1.0)
Monocytes Relative: 6 %
Neutro Abs: 12.3 10*3/uL — ABNORMAL HIGH (ref 1.7–7.7)
Neutrophils Relative %: 91 %
Platelets: 94 10*3/uL — ABNORMAL LOW (ref 150–400)
RBC: 2.76 MIL/uL — ABNORMAL LOW (ref 3.87–5.11)
RDW: 22.1 % — ABNORMAL HIGH (ref 11.5–15.5)
WBC: 13.5 10*3/uL — ABNORMAL HIGH (ref 4.0–10.5)
nRBC: 0 % (ref 0.0–0.2)

## 2019-08-07 LAB — MRSA PCR SCREENING: MRSA by PCR: NEGATIVE

## 2019-08-07 LAB — PROCALCITONIN: Procalcitonin: 11.5 ng/mL

## 2019-08-07 MED ORDER — HYDROCODONE-ACETAMINOPHEN 7.5-325 MG PO TABS
1.0000 | ORAL_TABLET | ORAL | Status: DC | PRN
  Administered 2019-08-07 – 2019-08-09 (×6): 1 via ORAL
  Filled 2019-08-07 (×6): qty 1

## 2019-08-07 MED ORDER — MIDODRINE HCL 5 MG PO TABS
5.0000 mg | ORAL_TABLET | Freq: Once | ORAL | Status: AC
  Administered 2019-08-07: 5 mg via ORAL
  Filled 2019-08-07: qty 1

## 2019-08-07 MED ORDER — CHLORHEXIDINE GLUCONATE CLOTH 2 % EX PADS
6.0000 | MEDICATED_PAD | Freq: Every day | CUTANEOUS | Status: DC
  Administered 2019-08-07 – 2019-08-10 (×3): 6 via TOPICAL

## 2019-08-07 MED ORDER — HEPARIN SODIUM (PORCINE) 1000 UNIT/ML IJ SOLN
INTRAMUSCULAR | Status: AC
  Administered 2019-08-07: 1000 [IU]
  Filled 2019-08-07: qty 1

## 2019-08-07 MED ORDER — HEPARIN SODIUM (PORCINE) 1000 UNIT/ML IJ SOLN
INTRAMUSCULAR | Status: AC
  Administered 2019-08-07: 4200 [IU]
  Filled 2019-08-07: qty 5

## 2019-08-07 MED ORDER — PRO-STAT SUGAR FREE PO LIQD
30.0000 mL | Freq: Two times a day (BID) | ORAL | Status: DC
  Administered 2019-08-07 – 2019-08-24 (×29): 30 mL via ORAL
  Filled 2019-08-07 (×29): qty 30

## 2019-08-07 MED ORDER — MIDODRINE HCL 5 MG PO TABS
10.0000 mg | ORAL_TABLET | Freq: Two times a day (BID) | ORAL | Status: DC
  Administered 2019-08-07 – 2019-08-23 (×32): 10 mg via ORAL
  Filled 2019-08-07 (×32): qty 2

## 2019-08-07 MED ORDER — DARBEPOETIN ALFA 100 MCG/0.5ML IJ SOSY
100.0000 ug | PREFILLED_SYRINGE | INTRAMUSCULAR | Status: DC
  Administered 2019-08-08: 100 ug via INTRAVENOUS
  Filled 2019-08-07 (×2): qty 0.5

## 2019-08-07 MED ORDER — NEPRO/CARBSTEADY PO LIQD
237.0000 mL | Freq: Two times a day (BID) | ORAL | Status: DC
  Administered 2019-08-07 – 2019-08-23 (×22): 237 mL via ORAL
  Filled 2019-08-07: qty 237

## 2019-08-07 MED ORDER — RENA-VITE PO TABS
1.0000 | ORAL_TABLET | Freq: Every day | ORAL | Status: DC
  Administered 2019-08-07 – 2019-08-24 (×18): 1 via ORAL
  Filled 2019-08-07 (×18): qty 1

## 2019-08-07 NOTE — Progress Notes (Signed)
Initial Nutrition Assessment  DOCUMENTATION CODES:   Not applicable  INTERVENTION:   - Nepro Shake po BID, each supplement provides 425 kcal and 19 grams protein  - Pro-stat 30 ml po BID, each supplement provides 100 kcal and 15 grams protein  - Renal MVI daily  NUTRITION DIAGNOSIS:   Increased nutrient needs related to chronic illness (ESRD on HD, CHF) as evidenced by estimated needs.  GOAL:   Patient will meet greater than or equal to 90% of their needs  MONITOR:   PO intake, Supplement acceptance, Labs, Weight trends, Skin, I & O's  REASON FOR ASSESSMENT:   Malnutrition Screening Tool    ASSESSMENT:   54 year old female who presented on 7/03 with fever, joint pain, weakness. PMH of ESRD s/p renal transplant complicated by CKD stage IV on chronic steroids and immunosuppressive therapy recently progressed to HD, chronic anemia secondary to CKD, HTN, TIA, CHF, pulmonary HTN. Admitted with HCAP.   Last HD on 08/06/19 with 2002 ml net UF. Plan for HD again today.  Spoke with pt at bedside. Pt lethargic at time of RD visit. Pt reports having a "normal appetite" but states that she typically "eats like a bird." Pt reports typically eating 2 meals daily. When asked what she may eat for a meal, pt reports "it depends." Pt reports that she does drink Ensure and Boost at home. Pt willing to drink Nepro during admission. RD provided pt with a vanilla Nepro. Pt states she will drink it after she rests. RN aware.  Pt denies experiencing any recent changes in her weight. Reviewed weight history in chart. Weight trending up over the last year. However, pt with generalized edema at this time which may be falsely elevating weight. Per Nephrology, EDW: 57.5 kg.  RD will order Pro-stat and renal MVI in addition to Nepro shakes. RD encouraged PO intake.  Medications reviewed and include: Aranesp, pepcid, prednisone, Prograf, vitamin D 50,000 units weekly, IV abx  Labs reviewed: phosphorus  5.5, hemoglobin 9.5  NUTRITION - FOCUSED PHYSICAL EXAM:    Most Recent Value  Orbital Region No depletion  Upper Arm Region Mild depletion  Thoracic and Lumbar Region Mild depletion  Buccal Region No depletion  Temple Region No depletion  Clavicle Bone Region Mild depletion  Clavicle and Acromion Bone Region Mild depletion  Scapular Bone Region No depletion  Dorsal Hand No depletion  Patellar Region No depletion  Anterior Thigh Region No depletion  Posterior Calf Region No depletion  Edema (RD Assessment) Moderate  Hair Reviewed  Eyes Reviewed  Mouth Reviewed  Skin Reviewed  Nails Reviewed       Diet Order:   Diet Order            Diet renal with fluid restriction Fluid restriction: 1200 mL Fluid; Room service appropriate? Yes; Fluid consistency: Thin  Diet effective now                 EDUCATION NEEDS:   No education needs have been identified at this time  Skin:  Skin Assessment: Skin Integrity Issues: Stage II: sacrum  Last BM:  no documented BM  Height:   Ht Readings from Last 1 Encounters:  08/05/19 4\' 11"  (1.499 m)    Weight:   Wt Readings from Last 1 Encounters:  08/05/19 57.9 kg    BMI:  Body mass index is 25.78 kg/m.  Estimated Nutritional Needs:   Kcal:  1700-1900  Protein:  85-100 grams  Fluid:  1000 ml + UOP  Gaynell Face, MS, RD, LDN Inpatient Clinical Dietitian Please see AMiON for contact information.

## 2019-08-07 NOTE — Progress Notes (Addendum)
[name] NEPHROLOGY PROGRESS NOTE  Assessment/ Plan: Pt is a 54 y.o. yo female with hypertension, CHF, ESRD failed kidney transplant, now on dialysis with pneumonia.  OP HD:[facility] TTS: 4h  57.5kg (down to 56 last HD)  2/2 bath Hep 1000  Fem TDC R,  mircera 100 q2 wk last 6/24.  #HCAP- suspected LLL PNA, started on IV vanc/ zosyn.  WBC count trending down.  In room air.  #Acute gout flare: On colchicine and steroid.  # ESRD / failed renal Tx - TTS HD.    Had a 3 hours treatment on 7/3 with 2 L UF. She looks volume up therefore plan for extra treatment today.  UF limited by hypotension.  #Hypotension/Vol overload: Edematous and volume overload on exam.  Blood pressure is low therefore increasing midodrine to 10 mg.  Attempt UF during HD.   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  No iron because of infection.  # MBD ckd: Calcium phosphorus okay.  Monitor lab.  # H/o COVID infection - April 2019here w/ COVID pna and prolonged admit. PCR COVID negative last night.   Subjective: Seen and examined at bedside.  Reports not feeling well with some shortness of breath.  Denies nausea vomiting headache dizziness.  Blood pressure was running low.  On midodrine. Objective Vital signs in last 24 hours: Vitals:   08/07/19 0200 08/07/19 0500 08/07/19 0534 08/07/19 0748  BP: (!) 80/56  (!) 88/58 (!) 96/57  Pulse:    69  Resp: 15 15  12   Temp:    98.9 F (37.2 C)  TempSrc:    Oral  SpO2:    100%  Weight:      Height:       Weight change:   Intake/Output Summary (Last 24 hours) at 08/07/2019 0904 Last data filed at 08/07/2019 9528 Gross per 24 hour  Intake 100 ml  Output --  Net 100 ml       Labs: Basic Metabolic Panel: Recent Labs  Lab 08/05/19 1206 08/06/19 0815 08/07/19 0619  NA 135 137 136  K 4.5 3.7 4.3  CL 98 100 101  CO2 26 23 23   GLUCOSE 91 84 132*  BUN 31* 14 31*  CREATININE  4.60* 2.41* 4.00*  CALCIUM 8.7* 8.2* 8.4*  PHOS  --   --  5.5*   Liver Function Tests: Recent Labs  Lab 08/05/19 1206 08/07/19 0619  AST 25  --   ALT 25  --   ALKPHOS 117  --   BILITOT 2.5*  --   PROT 5.4*  --   ALBUMIN 3.1* 2.5*   No results for input(s): LIPASE, AMYLASE in the last 168 hours. No results for input(s): AMMONIA in the last 168 hours. CBC: Recent Labs  Lab 08/05/19 1206 08/06/19 0815 08/07/19 0619  WBC 11.9* 14.4* 13.5*  NEUTROABS 10.2*  --  PENDING  HGB 9.0* 9.2* 9.5*  HCT 28.9* 29.3* 29.7*  MCV 109.5* 106.2* 107.6*  PLT 89* 71* 94*   Cardiac Enzymes: No results for input(s): CKTOTAL, CKMB, CKMBINDEX, TROPONINI in the last 168 hours. CBG: No results for input(s): GLUCAP in the last 168 hours.  Iron Studies: No results for input(s): IRON, TIBC, TRANSFERRIN, FERRITIN in the last 72 hours. Studies/Results: DG Chest Port 1 View  Result Date: 08/05/2019 CLINICAL DATA:  Fever. Dialysis in the RIGHT leg. Swelling of the RIGHT leg yesterday with progressing pain  today. Patient missed dialysis today. EXAM: PORTABLE CHEST 1 VIEW COMPARISON:  07/07/2019 FINDINGS: Heart size is within normal limits. Interval development of LEFT LOWER lobe consolidation/infiltrate with associated air bronchograms. Increased LEFT pleural effusion. Prominent interstitial markings are consistent with mild pulmonary edema and are increased. SVC stent. IMPRESSION: 1. Interval development of LEFT LOWER lobe consolidation/infiltrate and LEFT pleural effusion. 2. Increased interstitial pulmonary edema. Electronically Signed   By: Nolon Nations M.D.   On: 08/05/2019 12:53    Medications: Infusions: . piperacillin-tazobactam (ZOSYN)  IV 2.25 g (08/07/19 0535)    Scheduled Medications: . aspirin EC  81 mg Oral Daily  . Chlorhexidine Gluconate Cloth  6 each Topical Q0600  . colchicine  0.3 mg Oral Daily  . famotidine  20 mg Oral Daily  . gabapentin  100 mg Oral BID  . guaiFENesin  600  mg Oral BID  . heparin  5,000 Units Subcutaneous Q12H  . latanoprost  1 drop Left Eye QHS  . midodrine  5 mg Oral BID WC  . predniSONE  30 mg Oral BID WC  . tacrolimus  2 mg Oral QHS  . tacrolimus  3 mg Oral Daily  . Vitamin D (Ergocalciferol)  50,000 Units Oral Q Sun    have reviewed scheduled and prn medications.  Physical Exam: General:NAD, comfortable Heart:RRR, s1s2 nl Lungs: Some basal rhonchi. Abdomen:soft, Non-tender, non-distended Extremities: Both upper and lower extremity edema present Dialysis Access: Right femoral HD catheter  Tina Watkins Tina Watkins 08/07/2019,9:04 AM  LOS: 2 days  Pager: [phone]

## 2019-08-07 NOTE — Progress Notes (Signed)
PROGRESS NOTE    Tina Watkins  ZOX:[id number] DOB: 1965-10-06 DOA: 08/05/2019 PCP: [name], FNP   Brief Narrative: Patient is a 41-year female with history of ESRD status post renal transplant complicated by CKD stage IV on chronic steroids and immunosuppressive therapy, chronic anemia secondary to CKD, hypertension, TIA, chronic diastolic congestive heart failure, moderate pulmonary hypertension who presented with multiple joints pain which significantly limited her mobility.  Symptoms started about a week ago.  It involved right wrist and several fingers and right elbow and bilateral knees and ankles.  She also had low-grade fever.  Patient also developed productive  cough with yellow sputum.  She was started on hemodialysis this June because of failed kidney transplant.She has a right-sided femoral HD catheter for dialysis.  On presentation, chest x-ray showed possible left lower lobe pneumonia.  Admitted for further management.  Started on IV antibiotics and being also managed for acute gout flare.  Assessment & Plan:   Active Problems:   HCAP (healthcare-associated pneumonia)   Healthcare associated pneumonia: Presented with cough, new infiltrate on the chest x-ray.  Leukocytosis. started on vancomycin and Zosyn.  Elevated procalcitonin.  Presented with fever.  Since MRSA PCR is negative, vancomycin stopped.  Continue Zosyn for now.   Afebrile now.  Continue bronchodilators as needed, anticough medications.  We will follow-up cultures.  CKD stage V on hemodialysis: Nephrology following.  She has right-sided femoral dialysis catheter.  Not planning for removing the dialysis catheter unless blood cultures are positive.  She has extremely limited IV access on the upper extremities.  She is following with vascular surgery as an outpatient for permanent access.  Patient still produces urine. Nephrology planning for dialysis today.  Acute gouty flareup: Presented with pain on  the multiple joints.  Atypical presentation.  Colchicine restarted due to severe pain.  Continue steroids.  Dose of steroid has been increased from her baseline dose for gout.  Complains of severe pain  mainly on her left ankle.  She has severe tenderness on the left ankle.  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  Her nephrologist is still recommending to continue steroid and tacrolimus.   Imuran was discontinued last month due to worsening pancytopenia.  Orthostatic hypotension: On midodrine.  BP soft.  She has chronic hypotension.  Leukocytosis: Leukocytosis most likely associated with her acute gout flareup.   Continue to monitor CBC.  Anemia of chronic disease/thrombocytopenia: Associated with CKD.  Currently hemoglobin stable.  She has chronic macrocytosis.  Normal vitamin B12 and folate.  Chronic diastolic heart failure: Volume managed by dialysis.  Last echo on 12/20 showed restrictive cardiomyopathy, grade 3 diastolic dysfunction, preserved ejection fraction, moderate pulmonary hypertension  History of Covid infection: On April 2019.  Presented with Covid pneumonia and had prolonged hospitalization.  Debility/deconditioning: Physical therapy evaluation done and recommended HHPT         DVT prophylaxis: Heparin subcu Code Status: Full code Family Communication: None present at the bedside Status is: Inpatient  Remains inpatient appropriate because:IV treatments appropriate due to intensity of illness or inability to take PO   Dispo: The patient is from: Home              Anticipated d/c is to: Home  With home health              Anticipated d/c date is: 1 days              Patient currently is  not medically stable to d/c. Likely discharge home tomorrow.   Consultants: Nephrology  Procedures: Hemodialysis  Antimicrobials:  Anti-infectives (From admission, onward)   Start     Dose/Rate Route Frequency Ordered Stop   08/08/19 1200   vancomycin (VANCOCIN) IVPB 500 mg/100 ml premix  Status:  Discontinued        500 mg 100 mL/hr over 60 Minutes Intravenous Every T-Th-Sa (Hemodialysis) 08/05/19 1522 08/07/19 0748   08/05/19 2030  piperacillin-tazobactam (ZOSYN) IVPB 2.25 g     Discontinue     2.25 g 100 mL/hr over 30 Minutes Intravenous Every 8 hours 08/05/19 1522     08/05/19 1230  vancomycin (VANCOCIN) IVPB 1000 mg/200 mL premix        1,000 mg 200 mL/hr over 60 Minutes Intravenous  Once 08/05/19 1219 08/05/19 1412   08/05/19 1230  piperacillin-tazobactam (ZOSYN) IVPB 3.375 g        3.375 g 100 mL/hr over 30 Minutes Intravenous  Once 08/05/19 1219 08/05/19 1328      Subjective: Patient seen and examined at the bedside this morning.  Hemodynamically stable.  Feels much better today.  There was episode of hypotension.  Last night.  Currently blood pressure acceptable.  Pain on the left ankle and hands have improved..  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 No JVD, murmurs, rubs, gallops or clicks. Gastrointestinal system: Abdomen is nondistended, soft and nontender. No organomegaly or masses felt. Normal bowel sounds heard. Central nervous system: Alert and oriented. No focal neurological deficits. Extremities: Trace edema on all extremities edema, no clubbing ,no cyanosis, tender left ankle and right hand, temporary dialysis catheter on the right groin  skin: No  rashes, lesions or ulcers,no icterus ,no pallor   Data Reviewed: I have personally reviewed following labs and imaging studies  CBC: Recent Labs  Lab 08/05/19 1206 08/06/19 0815 08/07/19 0619  WBC 11.9* 14.4* 13.5*  NEUTROABS 10.2*  --  PENDING  HGB 9.0* 9.2* 9.5*  HCT 28.9* 29.3* 29.7*  MCV 109.5* 106.2* 107.6*  PLT 89* 71* 94*   Basic Metabolic Panel: Recent Labs  Lab 08/05/19 1206 08/06/19 0815 08/07/19 0619  NA 135 137 136  K 4.5 3.7 4.3  CL 98 100 101  CO2 26 23 23   GLUCOSE 91 84 132*  BUN 31* 14 31*  CREATININE 4.60* 2.41* 4.00*  CALCIUM 8.7* 8.2* 8.4*  PHOS  --   --  5.5*   GFR: Estimated Creatinine Clearance: 12.9 mL/min (A) (by C-G formula based on SCr of 4 mg/dL (H)). Liver Function Tests: Recent Labs  Lab 08/05/19 1206 08/07/19 0619  AST 25  --   ALT 25  --   ALKPHOS 117  --   BILITOT 2.5*  --  PROT 5.4*  --   ALBUMIN 3.1* 2.5*   No results for input(s): LIPASE, AMYLASE in the last 168 hours. No results for input(s): AMMONIA in the last 168 hours. Coagulation Profile: No results for input(s): INR, PROTIME in the last 168 hours. Cardiac Enzymes: No results for input(s): CKTOTAL, CKMB, CKMBINDEX, TROPONINI in the last 168 hours. BNP (last 3 results) No results for input(s): PROBNP in the last 8760 hours. HbA1C: No results for input(s): HGBA1C in the last 72 hours. CBG: No results for input(s): GLUCAP in the last 168 hours. Lipid Profile: No results for input(s): CHOL, HDL, LDLCALC, TRIG, CHOLHDL, LDLDIRECT in the last 72 hours. Thyroid Function Tests: No results for input(s): TSH, T4TOTAL, FREET4, T3FREE, THYROIDAB in the last 72 hours. Anemia Panel: Recent Labs    08/06/19 1359  VITAMINB12 744  FOLATE 7.2   Sepsis Labs: Recent Labs  Lab 08/05/19 1206 08/06/19 0815 08/07/19 0619  PROCALCITON 2.81 5.49 11.50  LATICACIDVEN 1.3  --   --     Recent Results (from the past 240 hour(s))  Blood Culture (routine x 2)     Status: None  (Preliminary result)   Collection Time: 08/05/19 12:05 PM   Specimen: BLOOD  Result Value Ref Range Status   Specimen Description BLOOD RIGHT ANTECUBITAL  Final   Special Requests   Final    BOTTLES DRAWN AEROBIC AND ANAEROBIC Blood Culture adequate volume   Culture   Final    NO GROWTH < 24 HOURS Performed at [facility], [place] [address], [place], [place] [address]    Report Status PENDING  Incomplete  Blood Culture (routine x 2)     Status: None (Preliminary result)   Collection Time: 08/05/19  2:31 PM   Specimen: BLOOD LEFT ARM  Result Value Ref Range Status   Specimen Description BLOOD LEFT ARM  Final   Special Requests   Final    BOTTLES DRAWN AEROBIC ONLY Blood Culture results may not be optimal due to an inadequate volume of blood received in culture bottles   Culture   Final    NO GROWTH < 24 HOURS Performed at [facility], [place] [address] [place], [place] [address]    Report Status PENDING  Incomplete  Urine culture     Status: None   Collection Time: 08/05/19  6:36 PM   Specimen: In/Out Cath Urine  Result Value Ref Range Status   Specimen Description IN/OUT CATH URINE  Final   Special Requests NONE  Final   Culture   Final    NO GROWTH Performed at [facility], [place] [address], [place], [place] [address]    Report Status 08/06/2019 FINAL  Final  SARS Coronavirus 2 by RT PCR (hospital order, performed in [facility] hospital lab) Nasopharyngeal Nasopharyngeal Swab     Status: None   Collection Time: 08/05/19  8:15 PM   Specimen: Nasopharyngeal Swab  Result Value Ref Range Status   SARS Coronavirus 2 NEGATIVE NEGATIVE Final    Comment: (NOTE) SARS-CoV-2 target nucleic acids are NOT DETECTED.  The SARS-CoV-2 RNA is generally detectable in upper and lower respiratory specimens during the acute phase of infection. The lowest concentration of SARS-CoV-2 viral copies this assay can detect is 250 copies / mL. A negative result does not preclude  SARS-CoV-2 infection and should not be used as the sole basis for treatment or other patient management decisions.  A negative result may occur with improper specimen collection /  handling, submission of specimen other than nasopharyngeal swab, presence of viral mutation(s) within the areas targeted by this assay, and inadequate number of viral copies (<250 copies / mL). A negative result must be combined with clinical observations, patient history, and epidemiological information.  Fact Sheet for Patients:   StrictlyIdeas.no  Fact Sheet for Healthcare Providers: BankingDealers.co.za  This test is not yet approved or  cleared by the [place] FDA and has been authorized for detection and/or diagnosis of SARS-CoV-2 by FDA under an Emergency Use Authorization (EUA).  This EUA will remain in effect (meaning this test can be used) for the duration of the COVID-19 declaration under Section 564(b)(1) of the Act, 21 U.S.C. section 360bbb-3(b)(1), unless the authorization is terminated or revoked sooner.  Performed at [facility], [place] [address] [place], [place] [address]   MRSA PCR Screening     Status: None   Collection Time: 08/06/19 11:55 PM   Specimen: Nasal Mucosa; Nasopharyngeal  Result Value Ref Range Status   MRSA by PCR NEGATIVE NEGATIVE Final    Comment:        The GeneXpert MRSA Assay (FDA approved for NASAL specimens only), is one component of a comprehensive MRSA colonization surveillance program. It is not intended to diagnose MRSA infection nor to guide or monitor treatment for MRSA infections. Performed at [facility], [place] [address], [place], [place] [address]          Radiology Studies: DG Chest Port 1 View  Result Date: 08/05/2019 CLINICAL DATA:  Fever. Dialysis in the RIGHT leg. Swelling of the RIGHT leg yesterday with progressing pain today. Patient missed dialysis today. EXAM: PORTABLE  CHEST 1 VIEW COMPARISON:  07/07/2019 FINDINGS: Heart size is within normal limits. Interval development of LEFT LOWER lobe consolidation/infiltrate with associated air bronchograms. Increased LEFT pleural effusion. Prominent interstitial markings are consistent with mild pulmonary edema and are increased. SVC stent. IMPRESSION: 1. Interval development of LEFT LOWER lobe consolidation/infiltrate and LEFT pleural effusion. 2. Increased interstitial pulmonary edema. Electronically Signed   By: Nolon Nations M.D.   On: 08/05/2019 12:53        Scheduled Meds: . aspirin EC  81 mg Oral Daily  . Chlorhexidine Gluconate Cloth  6 each Topical Q0600  . colchicine  0.3 mg Oral Daily  . famotidine  20 mg Oral Daily  . gabapentin  100 mg Oral BID  . guaiFENesin  600 mg Oral BID  . heparin  5,000 Units Subcutaneous Q12H  . latanoprost  1 drop Left Eye QHS  . midodrine  5 mg Oral BID WC  . predniSONE  30 mg Oral BID WC  . tacrolimus  2 mg Oral QHS  . tacrolimus  3 mg Oral Daily  . Vitamin D (Ergocalciferol)  50,000 Units Oral Q Sun   Continuous Infusions: . piperacillin-tazobactam (ZOSYN)  IV 2.25 g (08/07/19 0535)     LOS: 2 days    Time spent: 35 mins,More than 50% of that time was spent in counseling and/or coordination of care.      [name], MD [facility] P7/06/2019, 7:49 AM

## 2019-08-07 NOTE — Progress Notes (Signed)
Patient requested for Dilaudid . Checked BP and noted it to be 78/52. She's asymptomatic. Patient will be going to dialysis this morning. Notified Dr. Marlowe Sax and awaiting new orders. Will continue to monitor.

## 2019-08-07 NOTE — Progress Notes (Signed)
PT Cancellation Note  Patient Details Name: Tina Watkins MRN: [id number] DOB: [birth year]   Cancelled Treatment:    Reason Eval/Treat Not Completed: Pain limiting ability to participate; attempted to see pt three times today, reports gout pain too much then eating lunch.  Will attempt again another day.   Reginia Naas 08/07/2019, 4:53 PM  [name], PT Acute Rehabilitation Services Pager:[phone] Office:[phone] 08/07/2019

## 2019-08-07 NOTE — Progress Notes (Signed)
Manual BP is 80/56, Notified DR. Rathore and she stated we will watch it closely. Stated no bolus at this time due to her fluid overload status. She also stated patient can go to the dialysis this morning. Will continue to monitor.

## 2019-08-08 LAB — RENAL FUNCTION PANEL
Albumin: 2.7 g/dL — ABNORMAL LOW (ref 3.5–5.0)
Anion gap: 12 (ref 5–15)
BUN: 26 mg/dL — ABNORMAL HIGH (ref 6–20)
CO2: 27 mmol/L (ref 22–32)
Calcium: 8.7 mg/dL — ABNORMAL LOW (ref 8.9–10.3)
Chloride: 99 mmol/L (ref 98–111)
Creatinine, Ser: 3.38 mg/dL — ABNORMAL HIGH (ref 0.44–1.00)
GFR calc Af Amer: 17 mL/min — ABNORMAL LOW (ref >60)
GFR calc non Af Amer: 15 mL/min — ABNORMAL LOW (ref >60)
Glucose, Bld: 109 mg/dL — ABNORMAL HIGH (ref 70–99)
Phosphorus: 5.1 mg/dL — ABNORMAL HIGH (ref 2.5–4.6)
Potassium: 4.6 mmol/L (ref 3.5–5.1)
Sodium: 138 mmol/L (ref 135–145)

## 2019-08-08 LAB — CBC WITH DIFFERENTIAL/PLATELET
Abs Immature Granulocytes: 0.08 10*3/uL — ABNORMAL HIGH (ref 0.00–0.07)
Basophils Absolute: 0 10*3/uL (ref 0.0–0.1)
Basophils Relative: 0 %
Eosinophils Absolute: 0 10*3/uL (ref 0.0–0.5)
Eosinophils Relative: 0 %
HCT: 30.3 % — ABNORMAL LOW (ref 36.0–46.0)
Hemoglobin: 9.4 g/dL — ABNORMAL LOW (ref 12.0–15.0)
Immature Granulocytes: 1 %
Lymphocytes Relative: 3 %
Lymphs Abs: 0.3 10*3/uL — ABNORMAL LOW (ref 0.7–4.0)
MCH: 33.6 pg (ref 26.0–34.0)
MCHC: 31 g/dL (ref 30.0–36.0)
MCV: 108.2 fL — ABNORMAL HIGH (ref 80.0–100.0)
Monocytes Absolute: 0.5 10*3/uL (ref 0.1–1.0)
Monocytes Relative: 5 %
Neutro Abs: 10.2 10*3/uL — ABNORMAL HIGH (ref 1.7–7.7)
Neutrophils Relative %: 91 %
Platelets: 102 10*3/uL — ABNORMAL LOW (ref 150–400)
RBC: 2.8 MIL/uL — ABNORMAL LOW (ref 3.87–5.11)
RDW: 21.9 % — ABNORMAL HIGH (ref 11.5–15.5)
WBC: 11.1 10*3/uL — ABNORMAL HIGH (ref 4.0–10.5)
nRBC: 0 % (ref 0.0–0.2)

## 2019-08-08 MED ORDER — PREDNISONE 20 MG PO TABS
40.0000 mg | ORAL_TABLET | Freq: Every day | ORAL | Status: DC
  Administered 2019-08-09 – 2019-08-10 (×2): 40 mg via ORAL
  Filled 2019-08-08 (×2): qty 2

## 2019-08-08 MED ORDER — HEPARIN SODIUM (PORCINE) 1000 UNIT/ML IJ SOLN
INTRAMUSCULAR | Status: AC
  Filled 2019-08-08: qty 5

## 2019-08-08 MED ORDER — COLCHICINE 0.6 MG PO TABS: 0.3000 mg | ORAL_TABLET | Freq: Every day | ORAL | 0 refills | Status: DC

## 2019-08-08 MED ORDER — CHLORHEXIDINE GLUCONATE CLOTH 2 % EX PADS
6.0000 | MEDICATED_PAD | Freq: Every day | CUTANEOUS | Status: DC
  Administered 2019-08-08 – 2019-08-11 (×4): 6 via TOPICAL

## 2019-08-08 MED ORDER — DARBEPOETIN ALFA 100 MCG/0.5ML IJ SOSY
PREFILLED_SYRINGE | INTRAMUSCULAR | Status: AC
  Filled 2019-08-08: qty 0.5

## 2019-08-08 MED ORDER — AMOXICILLIN-POT CLAVULANATE 500-125 MG PO TABS
1.0000 | ORAL_TABLET | Freq: Two times a day (BID) | ORAL | Status: AC
  Administered 2019-08-08 – 2019-08-11 (×8): 500 mg via ORAL
  Filled 2019-08-08 (×9): qty 1

## 2019-08-08 MED ORDER — PREDNISONE 5 MG PO TABS: 5.0000 mg | ORAL_TABLET | Freq: Every day | ORAL | 0 refills | Status: DC

## 2019-08-08 MED ORDER — AMOXICILLIN-POT CLAVULANATE 500-125 MG PO TABS: 1.0000 | ORAL_TABLET | Freq: Two times a day (BID) | ORAL | 0 refills | Status: DC

## 2019-08-08 MED ORDER — HYDROMORPHONE HCL 2 MG PO TABS: 2.0000 mg | ORAL_TABLET | Freq: Two times a day (BID) | ORAL | 0 refills | Status: DC | PRN

## 2019-08-08 NOTE — Progress Notes (Signed)
[facility] NEPHROLOGY PROGRESS NOTE  Assessment/ Plan: Pt is a 54 y.o. yo female with hypertension, CHF, ESRD failed kidney transplant, now on dialysis with pneumonia.  OP HD:[facility] TTS: 4h  57.5kg (down to 56 last HD)  2/2 bath Hep 1000  Fem TDC R,  mircera 100 q2 wk last 6/24.  #HCAP- suspected LLL PNA, started on IV vanc/ zosyn.  WBC count trending down.  In room air.  #Acute gout flare: On colchicine and steroid.  # ESRD / failed renal Tx - TTS HD.  She has been getting serial dialysis for volume overload.  Status post HD yesterday with only 1 kg UF.  We will do another treatment today to resume her regular schedule. UF limited by hypotension.  #Hypotension/Vol overload: Edematous and volume overload on exam.  Blood pressure is low therefore increased midodrine to 10 mg.  Attempt UF during HD.   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  No iron because of infection.  # MBD ckd: Calcium phosphorus okay.  Monitor lab.  # H/o COVID infection - April 2019here w/ COVID pna and prolonged admit. PCR COVID negative last night.   Subjective: Seen and examined at bedside.  Feeling weak otherwise no new event.  Denies nausea vomiting chest pain shortness of breath.  Had dialysis yesterday with 1 kg UF.  Objective Vital signs in last 24 hours: Vitals:   08/07/19 2300 08/08/19 0300 08/08/19 0741 08/08/19 0747  BP:   100/60   Pulse:   (!) 56   Resp: 20 16 12 18   Temp:   98 F (36.7 C)   TempSrc:      SpO2:   99%   Weight:      Height:       Weight change:   Intake/Output Summary (Last 24 hours) at 08/08/2019 0932 Last data filed at 08/08/2019 0909 Gross per 24 hour  Intake 747 ml  Output 1200 ml  Net -453 ml       Labs: Basic Metabolic Panel: Recent Labs  Lab 08/06/19 0815 08/07/19 0619 08/08/19 0357  NA 137 136 138  K 3.7 4.3 4.6  CL 100 101 99  CO2 23 23 27   GLUCOSE 84 132* 109*   BUN 14 31* 26*  CREATININE 2.41* 4.00* 3.38*  CALCIUM 8.2* 8.4* 8.7*  PHOS  --  5.5* 5.1*   Liver Function Tests: Recent Labs  Lab 08/05/19 1206 08/07/19 0619 08/08/19 0357  AST 25  --   --   ALT 25  --   --   ALKPHOS 117  --   --   BILITOT 2.5*  --   --   PROT 5.4*  --   --   ALBUMIN 3.1* 2.5* 2.7*   No results for input(s): LIPASE, AMYLASE in the last 168 hours. No results for input(s): AMMONIA in the last 168 hours. CBC: Recent Labs  Lab 08/05/19 1206 08/05/19 1206 08/06/19 0815 08/07/19 0619 08/08/19 0357  WBC 11.9*   < > 14.4* 13.5* 11.1*  NEUTROABS 10.2*  --   --  12.3* 10.2*  HGB 9.0*   < > 9.2* 9.5* 9.4*  HCT 28.9*   < > 29.3* 29.7* 30.3*  MCV 109.5*  --  106.2* 107.6* 108.2*  PLT 89*   < > 71* 94* 102*   < > = values in this interval not displayed.   Cardiac Enzymes: No results for  input(s): CKTOTAL, CKMB, CKMBINDEX, TROPONINI in the last 168 hours. CBG: No results for input(s): GLUCAP in the last 168 hours.  Iron Studies: No results for input(s): IRON, TIBC, TRANSFERRIN, FERRITIN in the last 72 hours. Studies/Results: No results found.  Medications: Infusions: . piperacillin-tazobactam (ZOSYN)  IV 2.25 g (08/08/19 0550)    Scheduled Medications: . aspirin EC  81 mg Oral Daily  . Chlorhexidine Gluconate Cloth  6 each Topical Q0600  . colchicine  0.3 mg Oral Daily  . darbepoetin (ARANESP) injection - DIALYSIS  100 mcg Intravenous Q Tue-HD  . famotidine  20 mg Oral Daily  . feeding supplement (NEPRO CARB STEADY)  237 mL Oral BID BM  . feeding supplement (PRO-STAT SUGAR FREE 64)  30 mL Oral BID  . gabapentin  100 mg Oral BID  . guaiFENesin  600 mg Oral BID  . heparin  5,000 Units Subcutaneous Q12H  . latanoprost  1 drop Left Eye QHS  . midodrine  10 mg Oral BID WC  . multivitamin  1 tablet Oral QHS  . predniSONE  30 mg Oral BID WC  . tacrolimus  2 mg Oral QHS  . tacrolimus  3 mg Oral Daily  . Vitamin D (Ergocalciferol)  50,000 Units Oral Q  Sun    have reviewed scheduled and prn medications.  Physical Exam: General:NAD, able to lie on bed, comfortable. Heart:RRR, s1s2 nl, no rubs Lungs: Some basal rhonchi. Abdomen:soft, Non-tender, non-distended Extremities: Both upper and lower extremity edema present Dialysis Access: Right femoral HD catheter  Tina Watkins 08/08/2019,9:32 AM  LOS: 3 days  Pager: [phone]

## 2019-08-08 NOTE — Discharge Summary (Signed)
Physician Discharge Summary  Tina Watkins QMG:[id number] DOB: 1965-12-03 DOA: 08/05/2019  PCP: [name], FNP  Admit date: 08/05/2019 Discharge date: 08/08/2019  Admitted From: Home Disposition:  Home  Discharge Condition:Stable CODE STATUS:FULL Diet recommendation: Heart Healthy   Brief/Interim Summary: Patient is a 50-year female with history of ESRD status post renal transplant complicated by CKD stage IV on chronic steroids and immunosuppressive therapy, chronic anemia secondary to CKD, hypertension, TIA, chronic diastolic congestive heart failure, moderate pulmonary hypertension who presented with multiple joints pain which significantly limited her mobility.  Symptoms started about a week ago.  It involved right wrist and several fingers and right elbow and bilateral knees and ankles.  She also had low-grade fever.  Patient also developed productive  cough with yellow sputum.  She was started on hemodialysis this June because of failed kidney transplant.She has a right-sided femoral HD catheter for dialysis.  On presentation, chest x-ray showed possible left lower lobe pneumonia.  Admitted for further management.  Started on IV antibiotics and being also managed for acute gout flare.  Hospital course remained stable.  She was seen by PT and recommended home health on discharge.  Her pain has improved with pain medications and steroids.  Her respiratory status is stable and she is maintaining her saturation on room air.  She is hemodynamically stable for discharge to home with oral antibiotics.  Following problems were addressed during her hospitalization:  Healthcare associated pneumonia: Presented with cough, new infiltrate on the chest x-ray.  Leukocytosis. started on vancomycin and Zosyn.  Elevated procalcitonin.  Presented with fever.  Since MRSA PCR is negative, vancomycin stopped.  C  Afebrile now.  Cultures have remained negative.  Her respiratory status is stable.   Currently on room air.  Antibiotics changed to oral.  CKD stage V on hemodialysis: Nephrology following.  She has right-sided femoral dialysis catheter.    She has extremely limited IV access on the upper extremities.  She is following with vascular surgery as an outpatient for permanent access.  Patient still produces urine.  She was dialyzed during this hospitalization.  Acute gouty flareup: Presented with pain on the multiple joints.  Atypical presentation.  Colchicine restarted due to severe pain.  Continue steroids.  Dose of steroid has been increased from her baseline dose for gout.  Complains of severe pain  mainly on her left ankle.    Continue tapering dose of prednisone.  Follow-up with rheumatology as an outpatient for the management of gout.  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  Her nephrologist is still recommending to continue steroid and tacrolimus.   Imuran was discontinued last month due to worsening pancytopenia.  Orthostatic hypotension: On midodrine.  BP soft.  She has chronic hypotension.  Leukocytosis: Leukocytosis most likely associated with her acute gout flareup.  Stable  Anemia of chronic disease/thrombocytopenia: Associated with CKD.  Currently hemoglobin stable.  She has chronic macrocytosis.  Normal vitamin B12 and folate.  Chronic diastolic heart failure: Volume managed by dialysis.  Last echo on 12/20 showed restrictive cardiomyopathy, grade 3 diastolic dysfunction, preserved ejection fraction, moderate pulmonary hypertension  History of Covid infection: On April 2019.  Presented with Covid pneumonia and had prolonged hospitalization.  Debility/deconditioning: Physical therapy evaluation done and recommended HHPT    Discharge Diagnoses:  Active Problems:   HCAP (healthcare-associated pneumonia)    Discharge Instructions  Discharge Instructions    Diet - low sodium heart healthy   Complete by: As  directed     Discharge instructions   Complete by: As directed    1)Please take prescribed medications as instructed. 2)Follow up with your PCP in a week. 3)Follow up with home health 4)Continue dialysis as scheduled 5)Follow up with pain management clinic.  Name and number of the provider has been attached.  Call for appointment 6)Follow up with rheumatology for the management of her gout.  Name and number the provider has been attached.  Call for appointment.   Discharge wound care:   Complete by: As directed    Continue wound care as before   Increase activity slowly   Complete by: As directed      Allergies as of 08/08/2019      Reactions   Levofloxacin Itching, Rash   Tape Other (See Comments)   "Certain surgical tapes peel off my skin"      Medication List    STOP taking these medications   oxyCODONE-acetaminophen 10-325 MG tablet Commonly known as: PERCOCET     TAKE these medications   amoxicillin-clavulanate 500-125 MG tablet Commonly known as: AUGMENTIN Take 1 tablet (500 mg total) by mouth 2 (two) times daily.   ARANESP (ALBUMIN FREE) IJ Inject as directed every 14 (fourteen) days.   aspirin EC 81 MG tablet Take 81 mg by mouth daily.   colchicine 0.6 MG tablet Take 0.5 tablets (0.3 mg total) by mouth daily.   diphenhydrAMINE 25 mg capsule Commonly known as: BENADRYL Take 1 capsule (25 mg total) by mouth every 8 (eight) hours as needed. What changed: reasons to take this   famotidine 20 MG tablet Commonly known as: Pepcid Take 1 tablet (20 mg total) by mouth 2 (two) times daily.   fluticasone 50 MCG/ACT nasal spray Commonly known as: FLONASE Place 1 spray into both nostrils daily as needed for allergies or rhinitis.   gabapentin 100 MG capsule Commonly known as: NEURONTIN Take 100 mg by mouth 2 (two) times daily.   HYDROmorphone 2 MG tablet Commonly known as: Dilaudid Take 1 tablet (2 mg total) by mouth every 12 (twelve) hours as needed for up to 5 days for  severe pain.   latanoprost 0.005 % ophthalmic solution Commonly known as: XALATAN Place 1 drop into the left eye at bedtime.   lidocaine 5 % Commonly known as: LIDODERM Place 1 patch onto the skin daily as needed (for pain- remove old patch first).   midodrine 5 MG tablet Commonly known as: PROAMATINE Take 2 tablets (10 mg total) by mouth 2 (two) times daily with a meal. What changed: how much to take   nystatin 100000 UNIT/ML suspension Commonly known as: MYCOSTATIN Use as directed 5 mLs in the mouth or throat daily as needed (FOR THRUSH).   ondansetron 4 MG disintegrating tablet Commonly known as: ZOFRAN-ODT Take 1 tablet (4 mg total) by mouth every 8 (eight) hours as needed for nausea or vomiting.   predniSONE 5 MG tablet Commonly known as: DELTASONE Take 1 tablet (5 mg total) by mouth daily with breakfast. Take 8 pills daily for 2 days then 4 pills daily for 2 days then 2 pills daily for 2 days then continue taking 1 pill daily What changed:   medication strength  how much to take  when to take this  additional instructions   Proventil HFA 108 (90 Base) MCG/ACT inhaler Generic drug: albuterol Inhale 2 puffs into the lungs 4 (four) times daily as needed for shortness of breath.   tacrolimus 1 MG capsule Commonly  known as: PROGRAF Take 2-3 mg by mouth See admin instructions. Take 3 mg by mouth in the morning and 2 mg at bedtime   Vitamin D (Ergocalciferol) 1.25 MG (50000 UNIT) Caps capsule Commonly known as: DRISDOL Take 50,000 Units by mouth every Sunday.   zolpidem 10 MG tablet Commonly known as: AMBIEN Take 10 mg by mouth at bedtime as needed for sleep.            Discharge Care Instructions  (From admission, onward)         Start     Ordered   08/08/19 0000  Discharge wound care:       Comments: Continue wound care as before   08/08/19 1233          Follow-up Information    [name], FNP. Schedule an appointment as soon as possible  for a visit in 1 week(s).   Specialty: Family Medicine Contact information: [place] [address] [phone]        Clydell Hakim, MD. Schedule an appointment as soon as possible for a visit in 2 week(s).   Specialty: Anesthesiology Contact information: [address] [place] [address] [phone]        Lahoma Rocker, MD. Schedule an appointment as soon as possible for a visit in 4 week(s).   Specialty: Rheumatology Contact information: [place] [address] [phone]              Allergies  Allergen Reactions  . Levofloxacin Itching and Rash  . Tape Other (See Comments)    "Certain surgical tapes peel off my skin"    Consultations:  Nephrology   Procedures/Studies: DG Ankle 2 Views Right  Result Date: 07/17/2019 CLINICAL DATA:  Right ankle pain for 1 week.  Initial encounter. EXAM: RIGHT ANKLE - 2 VIEW COMPARISON:  None. FINDINGS: No acute bony or joint abnormalities a identified. Joint spaces are preserved. 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 No worrisome bony lesion. No tibiotalar joint effusion. Subcutaneous calcifications about the anterior ankle and dorsum of the foot are noted. Atherosclerotic vascular disease is also seen. IMPRESSION: No acute abnormality.  No evidence of arthropathy. Small medullary infarct distal tibia. Subcutaneous calcifications compatible with prior infectious or inflammatory process. Atherosclerosis. Electronically Signed   By: Inge Rise M.D.   On: 07/17/2019 11:28   IR Fluoro Guide CV Line Right  Result Date: 07/11/2019 CLINICAL DATA:  Failing renal transplant and need for hemodialysis. History of multiple prior tunneled dialysis catheter placements and vascular access interventions including prior SVC stenting with documented SVC stent occlusion in 2001, tunneled femoral dialysis catheter placement in [id number] complicated by IVC  stenoses and translumbar IVC dialysis catheter placement in 1941 complicated by IVC stenosis. Request now made to try to place a tunneled dialysis catheter for current hemodialysis needs. Peripheral IV access is also needed for IV conscious sedation during the procedure and IV antibiotic administration. EXAM: 1. PERIPHERAL IV ACCESS VIA RIGHT BASILIC VEIN UNDER ULTRASOUND GUIDANCE 2. RIGHT INTERNAL JUGULAR VEIN ACCESS UNDER ULTRASOUND GUIDANCE 3. TUNNELED CENTRAL VENOUS HEMODIALYSIS CATHETER PLACEMENT VIA RIGHT COMMON FEMORAL VEIN WITH ULTRASOUND AND FLUOROSCOPIC GUIDANCE ANESTHESIA/SEDATION: 1.0 mg IV Versed; 25 mcg IV Fentanyl. Total Moderate Sedation Time:   35 minutes. The patient's level of consciousness and physiologic status were continuously monitored during the procedure by Radiology nursing. MEDICATIONS: 2 g IV Ancef. FLUOROSCOPY TIME:  3 minutes and 24 seconds.  18 mGy. PROCEDURE: The procedure, risks, benefits, and alternatives were explained to the patient. Questions regarding the procedure were encouraged and answered. The patient understands and consents to the procedure. A timeout was performed prior to initiating the procedure. Ultrasound was used to confirm patency of the right basilic vein in the upper arm. Skin of the right medial upper arm was prepped with chlorhexidine. 1% lidocaine was infiltrated at the level of the skin and subcutaneous tissues. Under ultrasound guidance, a 21 gauge needle was advanced into the right basilic vein. A guidewire was advanced. A 5 French micropuncture dilator was then advanced into the vein. This was capped and secured for IV access during the procedure for IV antibiotic administration and IV conscious sedation. Preliminary ultrasound was performed of the right and left neck and right groin. Ultrasound was used to confirm patency of the right internal jugular vein and right common femoral vein. The right neck and right groin were prepped with chlorhexidine in a  sterile fashion, and a sterile drape was applied covering the operative field. Maximum barrier sterile technique with sterile gowns and gloves were used for the procedure. Local anesthesia was provided with 1% lidocaine. A 21 gauge needle was advanced into the right internal jugular vein under direct, real-time ultrasound guidance. A guidewire was advanced. A transitional dilator was placed. Over a guidewire a 5 [place] catheter was then advanced. Attempt was made to advance the catheter into the SVC over a guidewire. Fluoroscopic spot images were obtained. Access of the right internal jugular vein was then abandoned and hemostasis obtained with manual compression. Under ultrasound guidance, a 21 gauge needle was advanced into the right common femoral vein. Ultrasound image documentation was performed. A venotomy incision was made in the right groin region. After securing guidewire access, a 5 French catheter was advanced over a guidewire. Guidewire advancement was performed into the inferior vena cava. A wire was kinked to measure appropriate tunneled dialysis catheter length. A Palindrome tunneled hemodialysis catheter measuring 28 cm from tip to cuff was chosen for placement. This was tunneled in a retrograde fashion from the right thigh to the venotomy incision. At the venotomy, serial dilatation was performed and a 16 Fr peel-away sheath was placed over a guidewire. The catheter was then placed through the sheath and the sheath removed. Final catheter positioning was confirmed and documented with a fluoroscopic spot image. The catheter was aspirated, flushed with saline, and injected with appropriate volume heparin dwells. The venotomy incision was closed with subcuticular 4-0 Vicryl. Dermabond was applied to the incision. The catheter exit site was secured with a Prolene retention suture. COMPLICATIONS: None. FINDINGS: Initial ultrasound demonstrates a patent right internal jugular vein. There is no patent left  internal jugular vein present with multiple small collateral veins present in the left neck. The right common femoral vein is normally patent in the right groin. Initial access of the right internal jugular vein was performed. A guidewire would not advance beyond the confluence of the brachiocephalic veins at the origin of the upper SVC. 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 Confirmation of venous occlusion was performed with a catheter and guidewire which could not be advanced below the level of chronic venous occlusion. The patient is not a candidate for future tunnel catheter placements in the chest due to chronic IVC occlusion. The right common femoral vein was  accessed. A guidewire initially advanced through the common femoral and external iliac veins easily and met some resistance at the confluence of the common iliac veins and expected lower IVC. A 5 French catheter was able to be advanced over a guidewire to about the level of the mid IVC but there was further resistance to guidewire and catheter passage at this level likely on the basis of chronic stenosis of the IVC. This is also near the location of previous translumbar IVC access for catheter placement. After right femoral tunneled catheter placement, the tip lies in the lower IVC. The catheter aspirates well and is ready for immediate use. Flow rates may be somewhat limited given probable IVC stenosis above the level of the tip of the dialysis catheter. However, this is the best access possible currently for a tunneled catheter and other access sites will be quite limited given findings today and prior history. IMPRESSION: 1. Ultrasound-guided venous access of the right basilic vein in the upper arm performed for IV antibiotic administration and IV conscious sedation during dialysis catheter  placement. 2. 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 The patient is not a candidate for future catheter placement in the chest given chronic SVC occlusion. 3. Patent right common femoral vein allowing placement of a tunneled dialysis catheter. This catheter was advanced to the level of the lower IVC and measures 28 cm from tip to cuff. A longer catheter was not able to be placed given catheter and guidewire evidence of significant mid IVC stenosis or occlusion based on restricted ability to advance a catheter and guidewire. Electronically Signed   By: Aletta Edouard M.D.   On: 07/11/2019 16:49   IR US Guide Vasc Access Right  Result Date: 07/11/2019 CLINICAL DATA:  Failing renal transplant and need for hemodialysis. History of multiple prior tunneled dialysis catheter placements and vascular access interventions including prior SVC stenting with documented SVC stent occlusion in 2001, tunneled femoral dialysis catheter placement in [id number] complicated by IVC stenoses and translumbar IVC dialysis catheter placement in 4332 complicated by IVC stenosis. Request now made to try to place a tunneled dialysis catheter for current hemodialysis needs. Peripheral IV access is also needed for IV conscious sedation during the procedure and IV antibiotic administration. EXAM: 1. PERIPHERAL IV ACCESS VIA RIGHT BASILIC VEIN UNDER ULTRASOUND GUIDANCE 2. RIGHT INTERNAL JUGULAR VEIN ACCESS UNDER ULTRASOUND GUIDANCE 3. TUNNELED CENTRAL VENOUS HEMODIALYSIS CATHETER PLACEMENT VIA RIGHT COMMON FEMORAL VEIN WITH ULTRASOUND AND FLUOROSCOPIC GUIDANCE ANESTHESIA/SEDATION: 1.0 mg IV Versed; 25 mcg IV Fentanyl. Total Moderate Sedation Time:   35 minutes. The  patient's level of consciousness and physiologic status were continuously monitored during the procedure by Radiology nursing. MEDICATIONS: 2 g IV Ancef. FLUOROSCOPY TIME:  3 minutes and 24 seconds.  18 mGy. PROCEDURE: The procedure, risks, benefits, and alternatives were explained to the patient. Questions regarding the procedure were encouraged and answered. The patient understands and consents to the procedure. A timeout was performed prior to initiating the procedure. Ultrasound was used to confirm patency of the right basilic vein in the upper arm. Skin of the right medial upper arm was prepped with chlorhexidine. 1% lidocaine was infiltrated at the level of the skin and subcutaneous tissues. Under ultrasound guidance, a 21 gauge needle was  advanced into the right basilic vein. A guidewire was advanced. A 5 French micropuncture dilator was then advanced into the vein. This was capped and secured for IV access during the procedure for IV antibiotic administration and IV conscious sedation. Preliminary ultrasound was performed of the right and left neck and right groin. Ultrasound was used to confirm patency of the right internal jugular vein and right common femoral vein. The right neck and right groin were prepped with chlorhexidine in a sterile fashion, and a sterile drape was applied covering the operative field. Maximum barrier sterile technique with sterile gowns and gloves were used for the procedure. Local anesthesia was provided with 1% lidocaine. A 21 gauge needle was advanced into the right internal jugular vein under direct, real-time ultrasound guidance. A guidewire was advanced. A transitional dilator was placed. Over a guidewire a 5 [place] catheter was then advanced. Attempt was made to advance the catheter into the SVC over a guidewire. Fluoroscopic spot images were obtained. Access of the right internal jugular vein was then abandoned and hemostasis obtained with manual compression. Under  ultrasound guidance, a 21 gauge needle was advanced into the right common femoral vein. Ultrasound image documentation was performed. A venotomy incision was made in the right groin region. After securing guidewire access, a 5 French catheter was advanced over a guidewire. Guidewire advancement was performed into the inferior vena cava. A wire was kinked to measure appropriate tunneled dialysis catheter length. A Palindrome tunneled hemodialysis catheter measuring 28 cm from tip to cuff was chosen for placement. This was tunneled in a retrograde fashion from the right thigh to the venotomy incision. At the venotomy, serial dilatation was performed and a 16 Fr peel-away sheath was placed over a guidewire. The catheter was then placed through the sheath and the sheath removed. Final catheter positioning was confirmed and documented with a fluoroscopic spot image. The catheter was aspirated, flushed with saline, and injected with appropriate volume heparin dwells. The venotomy incision was closed with subcuticular 4-0 Vicryl. Dermabond was applied to the incision. The catheter exit site was secured with a Prolene retention suture. COMPLICATIONS: None. FINDINGS: Initial ultrasound demonstrates a patent right internal jugular vein. There is no patent left internal jugular vein present with multiple small collateral veins present in the left neck. The right common femoral vein is normally patent in the right groin. Initial access of the right internal jugular vein was performed. A guidewire would not advance beyond the confluence of the brachiocephalic veins at the origin of the upper SVC. 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 Confirmation of venous occlusion was performed with a catheter and guidewire which could not be advanced below the level of chronic  venous occlusion. The patient is not a candidate for future tunnel catheter placements in the chest due to chronic IVC occlusion. The right common femoral vein was accessed. A guidewire initially advanced through the common femoral and external iliac veins easily and met some resistance at the confluence of the common iliac veins and expected lower IVC. A 5 French catheter was able to be advanced over a guidewire to about the level of the mid IVC but there was further resistance to guidewire and catheter passage at this level likely on the basis of chronic stenosis of the IVC. This is also near the location of previous translumbar IVC access for catheter  placement. After right femoral tunneled catheter placement, the tip lies in the lower IVC. The catheter aspirates well and is ready for immediate use. Flow rates may be somewhat limited given probable IVC stenosis above the level of the tip of the dialysis catheter. However, this is the best access possible currently for a tunneled catheter and other access sites will be quite limited given findings today and prior history. IMPRESSION: 1. Ultrasound-guided venous access of the right basilic vein in the upper arm performed for IV antibiotic administration and IV conscious sedation during dialysis catheter placement. 2. 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 The patient is not a candidate for future catheter placement in the chest given chronic SVC occlusion. 3. Patent right common femoral vein allowing placement of a tunneled dialysis catheter. This catheter was advanced to the level of the lower IVC and measures 28 cm from tip to cuff. A longer catheter was not able to be placed given  catheter and guidewire evidence of significant mid IVC stenosis or occlusion based on restricted ability to advance a catheter and guidewire. Electronically Signed   By: Irish Lack M.D.   On: 07/11/2019 16:49   IR US Guide Vasc Access Right  Result Date: 07/11/2019 CLINICAL DATA:  Failing renal transplant and need for hemodialysis. History of multiple prior tunneled dialysis catheter placements and vascular access interventions including prior SVC stenting with documented SVC stent occlusion in 2001, tunneled femoral dialysis catheter placement in 2001-2005 complicated by IVC stenoses and translumbar IVC dialysis catheter placement in 2005 complicated by IVC stenosis. Request now made to try to place a tunneled dialysis catheter for current hemodialysis needs. Peripheral IV access is also needed for IV conscious sedation during the procedure and IV antibiotic administration. EXAM: 1. PERIPHERAL IV ACCESS VIA RIGHT BASILIC VEIN UNDER ULTRASOUND GUIDANCE 2. RIGHT INTERNAL JUGULAR VEIN ACCESS UNDER ULTRASOUND GUIDANCE 3. TUNNELED CENTRAL VENOUS HEMODIALYSIS CATHETER PLACEMENT VIA RIGHT COMMON FEMORAL VEIN WITH ULTRASOUND AND FLUOROSCOPIC GUIDANCE ANESTHESIA/SEDATION: 1.0 mg IV Versed; 25 mcg IV Fentanyl. Total Moderate Sedation Time:   35 minutes. The patient's level of consciousness and physiologic status were continuously monitored during the procedure by Radiology nursing. MEDICATIONS: 2 g IV Ancef. FLUOROSCOPY TIME:  3 minutes and 24 seconds.  18 mGy. PROCEDURE: The procedure, risks, benefits, and alternatives were explained to the patient. Questions regarding the procedure were encouraged and answered. The patient understands and consents to the procedure. A timeout was performed prior to initiating the procedure. Ultrasound was used to confirm patency of the right basilic vein in the upper arm. Skin of the right medial upper arm was prepped with chlorhexidine. 1% lidocaine was infiltrated at the level of  the skin and subcutaneous tissues. Under ultrasound guidance, a 21 gauge needle was advanced into the right basilic vein. A guidewire was advanced. A 5 French micropuncture dilator was then advanced into the vein. This was capped and secured for IV access during the procedure for IV antibiotic administration and IV conscious sedation. Preliminary ultrasound was performed of the right and left neck and right groin. Ultrasound was used to confirm patency of the right internal jugular vein and right common femoral vein. The right neck and right groin were prepped with chlorhexidine in a sterile fashion, and a  sterile drape was applied covering the operative field. Maximum barrier sterile technique with sterile gowns and gloves were used for the procedure. Local anesthesia was provided with 1% lidocaine. A 21 gauge needle was advanced into the right internal jugular vein under direct, real-time ultrasound guidance. A guidewire was advanced. A transitional dilator was placed. Over a guidewire a 5 [place] catheter was then advanced. Attempt was made to advance the catheter into the SVC over a guidewire. Fluoroscopic spot images were obtained. Access of the right internal jugular vein was then abandoned and hemostasis obtained with manual compression. Under ultrasound guidance, a 21 gauge needle was advanced into the right common femoral vein. Ultrasound image documentation was performed. A venotomy incision was made in the right groin region. After securing guidewire access, a 5 French catheter was advanced over a guidewire. Guidewire advancement was performed into the inferior vena cava. A wire was kinked to measure appropriate tunneled dialysis catheter length. A Palindrome tunneled hemodialysis catheter measuring 28 cm from tip to cuff was chosen for placement. This was tunneled in a retrograde fashion from the right thigh to the venotomy incision. At the venotomy, serial dilatation was performed and a 16 Fr peel-away  sheath was placed over a guidewire. The catheter was then placed through the sheath and the sheath removed. Final catheter positioning was confirmed and documented with a fluoroscopic spot image. The catheter was aspirated, flushed with saline, and injected with appropriate volume heparin dwells. The venotomy incision was closed with subcuticular 4-0 Vicryl. Dermabond was applied to the incision. The catheter exit site was secured with a Prolene retention suture. COMPLICATIONS: None. FINDINGS: Initial ultrasound demonstrates a patent right internal jugular vein. There is no patent left internal jugular vein present with multiple small collateral veins present in the left neck. The right common femoral vein is normally patent in the right groin. Initial access of the right internal jugular vein was performed. A guidewire would not advance beyond the confluence of the brachiocephalic veins at the origin of the upper SVC. 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 Confirmation of venous occlusion was performed with a catheter and guidewire which could not be advanced below the level of chronic venous occlusion. The patient is not a candidate for future tunnel catheter placements in the chest due to chronic IVC occlusion. The right common femoral vein was accessed. A guidewire initially advanced through the common femoral and external iliac veins easily and met some resistance at the confluence of the common iliac veins and expected lower IVC. A 5 French catheter was able to be advanced over a guidewire to about the level of the mid IVC but there was further resistance to guidewire and catheter passage at this level likely on the basis of chronic stenosis of the IVC. This is also near the location of previous translumbar IVC access for catheter placement. After  right femoral tunneled catheter placement, the tip lies in the lower IVC. The catheter aspirates well and is ready for immediate use. Flow rates may be somewhat limited given probable IVC stenosis above the level of the tip of the dialysis catheter. However, this is the best access possible currently for a tunneled catheter and other access sites will be quite limited given findings today and prior history. IMPRESSION: 1. Ultrasound-guided venous access of the right basilic vein in the upper arm performed for  IV antibiotic administration and IV conscious sedation during dialysis catheter placement. 2. 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 The patient is not a candidate for future catheter placement in the chest given chronic SVC occlusion. 3. Patent right common femoral vein allowing placement of a tunneled dialysis catheter. This catheter was advanced to the level of the lower IVC and measures 28 cm from tip to cuff. A longer catheter was not able to be placed given catheter and guidewire evidence of significant mid IVC stenosis or occlusion based on restricted ability to advance a catheter and guidewire. Electronically Signed   By: Aletta Edouard M.D.   On: 07/11/2019 16:49   IR US Guide Vasc Access Right  Result Date: 07/11/2019 CLINICAL DATA:  Failing renal transplant and need for hemodialysis. History of multiple prior tunneled dialysis catheter placements and vascular access interventions including prior SVC stenting with documented SVC stent occlusion in 2001, tunneled femoral dialysis catheter placement in 3009-2330 complicated by IVC stenoses and translumbar IVC dialysis catheter placement in 0762 complicated by IVC  stenosis. Request now made to try to place a tunneled dialysis catheter for current hemodialysis needs. Peripheral IV access is also needed for IV conscious sedation during the procedure and IV antibiotic administration. EXAM: 1. PERIPHERAL IV ACCESS VIA RIGHT BASILIC VEIN UNDER ULTRASOUND GUIDANCE 2. RIGHT INTERNAL JUGULAR VEIN ACCESS UNDER ULTRASOUND GUIDANCE 3. TUNNELED CENTRAL VENOUS HEMODIALYSIS CATHETER PLACEMENT VIA RIGHT COMMON FEMORAL VEIN WITH ULTRASOUND AND FLUOROSCOPIC GUIDANCE ANESTHESIA/SEDATION: 1.0 mg IV Versed; 25 mcg IV Fentanyl. Total Moderate Sedation Time:   35 minutes. The patient's level of consciousness and physiologic status were continuously monitored during the procedure by Radiology nursing. MEDICATIONS: 2 g IV Ancef. FLUOROSCOPY TIME:  3 minutes and 24 seconds.  18 mGy. PROCEDURE: The procedure, risks, benefits, and alternatives were explained to the patient. Questions regarding the procedure were encouraged and answered. The patient understands and consents to the procedure. A timeout was performed prior to initiating the procedure. Ultrasound was used to confirm patency of the right basilic vein in the upper arm. Skin of the right medial upper arm was prepped with chlorhexidine. 1% lidocaine was infiltrated at the level of the skin and subcutaneous tissues. Under ultrasound guidance, a 21 gauge needle was advanced into the right basilic vein. A guidewire was advanced. A 5 French micropuncture dilator was then advanced into the vein. This was capped and secured for IV access during the procedure for IV antibiotic administration and IV conscious sedation. Preliminary ultrasound was performed of the right and left neck and right groin. Ultrasound was used to confirm patency of the right internal jugular vein and right common femoral vein. The right neck and right groin were prepped with chlorhexidine in a sterile fashion, and a sterile drape was applied covering the operative field.  Maximum barrier sterile technique with sterile gowns and gloves were used for the procedure. Local anesthesia was provided with 1% lidocaine. A 21 gauge needle was advanced into the right internal jugular vein under direct, real-time ultrasound guidance. A guidewire was advanced. A transitional dilator was placed. Over a guidewire a 5 [place] catheter was then advanced. Attempt was made to advance the catheter into the SVC over a guidewire. Fluoroscopic spot images were obtained. Access of the  right internal jugular vein was then abandoned and hemostasis obtained with manual compression. Under ultrasound guidance, a 21 gauge needle was advanced into the right common femoral vein. Ultrasound image documentation was performed. A venotomy incision was made in the right groin region. After securing guidewire access, a 5 French catheter was advanced over a guidewire. Guidewire advancement was performed into the inferior vena cava. A wire was kinked to measure appropriate tunneled dialysis catheter length. A Palindrome tunneled hemodialysis catheter measuring 28 cm from tip to cuff was chosen for placement. This was tunneled in a retrograde fashion from the right thigh to the venotomy incision. At the venotomy, serial dilatation was performed and a 16 Fr peel-away sheath was placed over a guidewire. The catheter was then placed through the sheath and the sheath removed. Final catheter positioning was confirmed and documented with a fluoroscopic spot image. The catheter was aspirated, flushed with saline, and injected with appropriate volume heparin dwells. The venotomy incision was closed with subcuticular 4-0 Vicryl. Dermabond was applied to the incision. The catheter exit site was secured with a Prolene retention suture. COMPLICATIONS: None. FINDINGS: Initial ultrasound demonstrates a patent right internal jugular vein. There is no patent left internal jugular vein present with multiple small collateral veins present in  the left neck. The right common femoral vein is normally patent in the right groin. Initial access of the right internal jugular vein was performed. A guidewire would not advance beyond the confluence of the brachiocephalic veins at the origin of the upper SVC. 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 Confirmation of venous occlusion was performed with a catheter and guidewire which could not be advanced below the level of chronic venous occlusion. The patient is not a candidate for future tunnel catheter placements in the chest due to chronic IVC occlusion. The right common femoral vein was accessed. A guidewire initially advanced through the common femoral and external iliac veins easily and met some resistance at the confluence of the common iliac veins and expected lower IVC. A 5 French catheter was able to be advanced over a guidewire to about the level of the mid IVC but there was further resistance to guidewire and catheter passage at this level likely on the basis of chronic stenosis of the IVC. This is also near the location of previous translumbar IVC access for catheter placement. After right femoral tunneled catheter placement, the tip lies in the lower IVC. The catheter aspirates well and is ready for immediate use. Flow rates may be somewhat limited given probable IVC stenosis above the level of the tip of the dialysis catheter. However, this is the best access possible currently for a tunneled catheter and other access sites will be quite limited given findings today and prior history. IMPRESSION: 1. Ultrasound-guided venous access of the right basilic vein in the upper arm performed for IV antibiotic administration and IV conscious sedation during dialysis catheter placement. 2. Patent right internal jugular vein was accessed and chronic  occlusion of the entire SVC confirmed by fluoroscopic probing with a catheter and guidewire. 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 The patient is not a candidate for future catheter placement in the chest given chronic SVC occlusion. 3. Patent right common femoral vein allowing placement of  a tunneled dialysis catheter. This catheter was advanced to the level of the lower IVC and measures 28 cm from tip to cuff. A longer catheter was not able to be placed given catheter and guidewire evidence of significant mid IVC stenosis or occlusion based on restricted ability to advance a catheter and guidewire. Electronically Signed   By: Aletta Edouard M.D.   On: 07/11/2019 16:49   DG Chest Port 1 View  Result Date: 08/05/2019 CLINICAL DATA:  Fever. Dialysis in the RIGHT leg. Swelling of the RIGHT leg yesterday with progressing pain today. Patient missed dialysis today. EXAM: PORTABLE CHEST 1 VIEW COMPARISON:  07/07/2019 FINDINGS: Heart size is within normal limits. Interval development of LEFT LOWER lobe consolidation/infiltrate with associated air bronchograms. Increased LEFT pleural effusion. Prominent interstitial markings are consistent with mild pulmonary edema and are increased. SVC stent. IMPRESSION: 1. Interval development of LEFT LOWER lobe consolidation/infiltrate and LEFT pleural effusion. 2. Increased interstitial pulmonary edema. Electronically Signed   By: Nolon Nations M.D.   On: 08/05/2019 12:53   IR RADIOLOGY PERIPHERAL GUIDED IV START  Result Date: 07/11/2019 CLINICAL DATA:  Failing renal transplant and need for hemodialysis. History of multiple prior tunneled dialysis catheter placements and vascular access interventions including prior SVC stenting with documented SVC stent occlusion in 2001, tunneled femoral dialysis catheter placement in 1610-9604 complicated by  IVC stenoses and translumbar IVC dialysis catheter placement in 5409 complicated by IVC stenosis. Request now made to try to place a tunneled dialysis catheter for current hemodialysis needs. Peripheral IV access is also needed for IV conscious sedation during the procedure and IV antibiotic administration. EXAM: 1. PERIPHERAL IV ACCESS VIA RIGHT BASILIC VEIN UNDER ULTRASOUND GUIDANCE 2. RIGHT INTERNAL JUGULAR VEIN ACCESS UNDER ULTRASOUND GUIDANCE 3. TUNNELED CENTRAL VENOUS HEMODIALYSIS CATHETER PLACEMENT VIA RIGHT COMMON FEMORAL VEIN WITH ULTRASOUND AND FLUOROSCOPIC GUIDANCE ANESTHESIA/SEDATION: 1.0 mg IV Versed; 25 mcg IV Fentanyl. Total Moderate Sedation Time:   35 minutes. The patient's level of consciousness and physiologic status were continuously monitored during the procedure by Radiology nursing. MEDICATIONS: 2 g IV Ancef. FLUOROSCOPY TIME:  3 minutes and 24 seconds.  18 mGy. PROCEDURE: The procedure, risks, benefits, and alternatives were explained to the patient. Questions regarding the procedure were encouraged and answered. The patient understands and consents to the procedure. A timeout was performed prior to initiating the procedure. Ultrasound was used to confirm patency of the right basilic vein in the upper arm. Skin of the right medial upper arm was prepped with chlorhexidine. 1% lidocaine was infiltrated at the level of the skin and subcutaneous tissues. Under ultrasound guidance, a 21 gauge needle was advanced into the right basilic vein. A guidewire was advanced. A 5 French micropuncture dilator was then advanced into the vein. This was capped and secured for IV access during the procedure for IV antibiotic administration and IV conscious sedation. Preliminary ultrasound was performed of the right and left neck and right groin. Ultrasound was used to confirm patency of the right internal jugular vein and right common femoral vein. The right neck and right groin were prepped with chlorhexidine  in a sterile fashion, and a sterile drape was applied covering the operative field. Maximum barrier sterile technique with sterile gowns and gloves were used for the procedure. Local anesthesia was provided with 1% lidocaine. A 21 gauge needle was advanced into the right internal jugular vein under direct, real-time ultrasound guidance. A guidewire was advanced. A transitional dilator was placed. Over a guidewire a 5 [place] catheter  was then advanced. Attempt was made to advance the catheter into the SVC over a guidewire. Fluoroscopic spot images were obtained. Access of the right internal jugular vein was then abandoned and hemostasis obtained with manual compression. Under ultrasound guidance, a 21 gauge needle was advanced into the right common femoral vein. Ultrasound image documentation was performed. A venotomy incision was made in the right groin region. After securing guidewire access, a 5 French catheter was advanced over a guidewire. Guidewire advancement was performed into the inferior vena cava. A wire was kinked to measure appropriate tunneled dialysis catheter length. A Palindrome tunneled hemodialysis catheter measuring 28 cm from tip to cuff was chosen for placement. This was tunneled in a retrograde fashion from the right thigh to the venotomy incision. At the venotomy, serial dilatation was performed and a 16 Fr peel-away sheath was placed over a guidewire. The catheter was then placed through the sheath and the sheath removed. Final catheter positioning was confirmed and documented with a fluoroscopic spot image. The catheter was aspirated, flushed with saline, and injected with appropriate volume heparin dwells. The venotomy incision was closed with subcuticular 4-0 Vicryl. Dermabond was applied to the incision. The catheter exit site was secured with a Prolene retention suture. COMPLICATIONS: None. FINDINGS: Initial ultrasound demonstrates a patent right internal jugular vein. There is no patent  left internal jugular vein present with multiple small collateral veins present in the left neck. The right common femoral vein is normally patent in the right groin. Initial access of the right internal jugular vein was performed. A guidewire would not advance beyond the confluence of the brachiocephalic veins at the origin of the upper SVC. 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 Confirmation of venous occlusion was performed with a catheter and guidewire which could not be advanced below the level of chronic venous occlusion. The patient is not a candidate for future tunnel catheter placements in the chest due to chronic IVC occlusion. The right common femoral vein was accessed. A guidewire initially advanced through the common femoral and external iliac veins easily and met some resistance at the confluence of the common iliac veins and expected lower IVC. A 5 French catheter was able to be advanced over a guidewire to about the level of the mid IVC but there was further resistance to guidewire and catheter passage at this level likely on the basis of chronic stenosis of the IVC. This is also near the location of previous translumbar IVC access for catheter placement. After right femoral tunneled catheter placement, the tip lies in the lower IVC. The catheter aspirates well and is ready for immediate use. Flow rates may be somewhat limited given probable IVC stenosis above the level of the tip of the dialysis catheter. However, this is the best access possible currently for a tunneled catheter and other access sites will be quite limited given findings today and prior history. IMPRESSION: 1. Ultrasound-guided venous access of the right basilic vein in the upper arm performed for IV antibiotic administration and IV conscious sedation during dialysis  catheter placement. 2. 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 The patient  is not a candidate for future catheter placement in the chest given chronic SVC occlusion. 3. Patent right common femoral vein allowing placement of a tunneled dialysis catheter. This catheter was advanced to the level of the lower IVC and measures 28 cm from tip to cuff. A longer catheter was not able to be placed given catheter and guidewire evidence of significant mid IVC stenosis or occlusion based on restricted ability to advance a catheter and guidewire. Electronically Signed   By: Aletta Edouard M.D.   On: 07/11/2019 16:49      Subjective: Patient seen and examined at the bedside.  Hemodynamically stable for discharge today.  Discharge Exam: Vitals:   08/08/19 1130 08/08/19 1200  BP: (!) 114/53 (!) 110/53  Pulse: (!) 58 (!) 56  Resp: 18 12  Temp:    SpO2:     Vitals:   08/08/19 1058 08/08/19 1103 08/08/19 1130 08/08/19 1200  BP: (!) 92/52 (!) 103/55 (!) 114/53 (!) 110/53  Pulse: 60 (!) 56 (!) 58 (!) 56  Resp: '12 12 18 12  '$ Temp: 98.1 F (36.7 C)     TempSrc: Oral     SpO2: 95%     Weight: 54.4 kg     Height:        General: Pt is alert, awake, not in acute distress Cardiovascular: RRR, S1/S2 +, no rubs, no gallops Respiratory: CTA bilaterally, no wheezing, no rhonchi, few crackles on the left Abdominal: Soft, NT, ND, bowel sounds + Extremities: no edema, no cyanosis, temporary dialysis catheter in the right groin    The results of significant diagnostics from this hospitalization (including imaging, microbiology, ancillary and laboratory) are listed below for reference.     Microbiology: Recent Results (from the past  240 hour(s))  Blood Culture (routine x 2)     Status: None (Preliminary result)   Collection Time: 08/05/19 12:05 PM   Specimen: BLOOD  Result Value Ref Range Status   Specimen Description BLOOD RIGHT ANTECUBITAL  Final   Special Requests   Final    BOTTLES DRAWN AEROBIC AND ANAEROBIC Blood Culture adequate volume   Culture   Final    NO GROWTH 2 DAYS Performed at [facility], [address] [place], [place] [address]    Report Status PENDING  Incomplete  Blood Culture (routine x 2)     Status: None (Preliminary result)   Collection Time: 08/05/19  2:31 PM   Specimen: BLOOD LEFT ARM  Result Value Ref Range Status   Specimen Description BLOOD LEFT ARM  Final   Special Requests   Final    BOTTLES DRAWN AEROBIC ONLY Blood Culture results may not be optimal due to an inadequate volume of blood received in culture bottles   Culture   Final    NO GROWTH 2 DAYS Performed at [facility], [place] [address], [place], [place] [address]    Report Status PENDING  Incomplete  Urine culture     Status: None   Collection Time: 08/05/19  6:36 PM   Specimen: In/Out Cath Urine  Result Value Ref Range Status   Specimen Description IN/OUT CATH URINE  Final   Special Requests NONE  Final   Culture   Final    NO GROWTH Performed at [facility], [place] [address] [place], [place] [address]    Report Status 08/06/2019 FINAL  Final  SARS Coronavirus 2 by RT PCR (hospital order, performed in [facility] hospital lab) Nasopharyngeal Nasopharyngeal  Swab     Status: None   Collection Time: 08/05/19  8:15 PM   Specimen: Nasopharyngeal Swab  Result Value Ref Range Status   SARS Coronavirus 2 NEGATIVE NEGATIVE Final    Comment: (NOTE) SARS-CoV-2 target nucleic acids are NOT DETECTED.  The SARS-CoV-2 RNA is generally detectable in upper and lower respiratory specimens during the acute phase of infection. The lowest concentration of SARS-CoV-2 viral copies this assay can detect is  250 copies / mL. A negative result does not preclude SARS-CoV-2 infection and should not be used as the sole basis for treatment or other patient management decisions.  A negative result may occur with improper specimen collection / handling, submission of specimen other than nasopharyngeal swab, presence of viral mutation(s) within the areas targeted by this assay, and inadequate number of viral copies (<250 copies / mL). A negative result must be combined with clinical observations, patient history, and epidemiological information.  Fact Sheet for Patients:   StrictlyIdeas.no  Fact Sheet for Healthcare Providers: BankingDealers.co.za  This test is not yet approved or  cleared by the [place] FDA and has been authorized for detection and/or diagnosis of SARS-CoV-2 by FDA under an Emergency Use Authorization (EUA).  This EUA will remain in effect (meaning this test can be used) for the duration of the COVID-19 declaration under Section 564(b)(1) of the Act, 21 U.S.C. section 360bbb-3(b)(1), unless the authorization is terminated or revoked sooner.  Performed at [facility], [place] [address], [place], [place] [address]   MRSA PCR Screening     Status: None   Collection Time: 08/06/19 11:55 PM   Specimen: Nasal Mucosa; Nasopharyngeal  Result Value Ref Range Status   MRSA by PCR NEGATIVE NEGATIVE Final    Comment:        The GeneXpert MRSA Assay (FDA approved for NASAL specimens only), is one component of a comprehensive MRSA colonization surveillance program. It is not intended to diagnose MRSA infection nor to guide or monitor treatment for MRSA infections. Performed at [facility], [place] [address] [place], [place] [address]      Labs: BNP (last 3 results) Recent Labs    04/17/19 1718 05/17/19 0235 07/07/19 1033  BNP 481.5* 506.4* 474.2*   Basic Metabolic Panel: Recent Labs  Lab 08/05/19 1206  08/06/19 0815 08/07/19 0619 08/08/19 0357  NA 135 137 136 138  K 4.5 3.7 4.3 4.6  CL 98 100 101 99  CO2 '26 23 23 27  '$ GLUCOSE 91 84 132* 109*  BUN 31* 14 31* 26*  CREATININE 4.60* 2.41* 4.00* 3.38*  CALCIUM 8.7* 8.2* 8.4* 8.7*  PHOS  --   --  5.5* 5.1*   Liver Function Tests: Recent Labs  Lab 08/05/19 1206 08/07/19 0619 08/08/19 0357  AST 25  --   --   ALT 25  --   --   ALKPHOS 117  --   --   BILITOT 2.5*  --   --   PROT 5.4*  --   --   ALBUMIN 3.1* 2.5* 2.7*   No results for input(s): LIPASE, AMYLASE in the last 168 hours. No results for input(s): AMMONIA in the last 168 hours. CBC: Recent Labs  Lab 08/05/19 1206 08/06/19 0815 08/07/19 0619 08/08/19 0357  WBC 11.9* 14.4* 13.5* 11.1*  NEUTROABS 10.2*  --  12.3* 10.2*  HGB 9.0* 9.2* 9.5* 9.4*  HCT 28.9* 29.3* 29.7* 30.3*  MCV 109.5* 106.2* 107.6* 108.2*  PLT 89* 71* 94* 102*  Cardiac Enzymes: No results for input(s): CKTOTAL, CKMB, CKMBINDEX, TROPONINI in the last 168 hours. BNP: Invalid input(s): POCBNP CBG: No results for input(s): GLUCAP in the last 168 hours. D-Dimer No results for input(s): DDIMER in the last 72 hours. Hgb A1c No results for input(s): HGBA1C in the last 72 hours. Lipid Profile No results for input(s): CHOL, HDL, LDLCALC, TRIG, CHOLHDL, LDLDIRECT in the last 72 hours. Thyroid function studies No results for input(s): TSH, T4TOTAL, T3FREE, THYROIDAB in the last 72 hours.  Invalid input(s): FREET3 Anemia work up Recent Labs    08/06/19 1359  VITAMINB12 744  FOLATE 7.2   Urinalysis    Component Value Date/Time   COLORURINE AMBER (A) 08/05/2019 1835   APPEARANCEUR HAZY (A) 08/05/2019 1835   LABSPEC 1.021 08/05/2019 1835   PHURINE 5.0 08/05/2019 [place] 08/05/2019 1835   HGBUR SMALL (A) 08/05/2019 1835   BILIRUBINUR NEGATIVE 08/05/2019 1835   BILIRUBINUR Negative 01/04/2019 1030   KETONESUR NEGATIVE 08/05/2019 1835   PROTEINUR 30 (A) 08/05/2019 1835    UROBILINOGEN 0.2 01/04/2019 1030   UROBILINOGEN 0.2 10/15/2014 0946   NITRITE NEGATIVE 08/05/2019 1835   LEUKOCYTESUR NEGATIVE 08/05/2019 1835   Sepsis Labs Invalid input(s): PROCALCITONIN,  WBC,  LACTICIDVEN Microbiology Recent Results (from the past 240 hour(s))  Blood Culture (routine x 2)     Status: None (Preliminary result)   Collection Time: 08/05/19 12:05 PM   Specimen: BLOOD  Result Value Ref Range Status   Specimen Description BLOOD RIGHT ANTECUBITAL  Final   Special Requests   Final    BOTTLES DRAWN AEROBIC AND ANAEROBIC Blood Culture adequate volume   Culture   Final    NO GROWTH 2 DAYS Performed at [facility], [place] [address], [place], [place] [address]    Report Status PENDING  Incomplete  Blood Culture (routine x 2)     Status: None (Preliminary result)   Collection Time: 08/05/19  2:31 PM   Specimen: BLOOD LEFT ARM  Result Value Ref Range Status   Specimen Description BLOOD LEFT ARM  Final   Special Requests   Final    BOTTLES DRAWN AEROBIC ONLY Blood Culture results may not be optimal due to an inadequate volume of blood received in culture bottles   Culture   Final    NO GROWTH 2 DAYS Performed at [facility], [place] [address], [place], [place] [address]    Report Status PENDING  Incomplete  Urine culture     Status: None   Collection Time: 08/05/19  6:36 PM   Specimen: In/Out Cath Urine  Result Value Ref Range Status   Specimen Description IN/OUT CATH URINE  Final   Special Requests NONE  Final   Culture   Final    NO GROWTH Performed at [facility], [place] [address], [place], [place] [address]    Report Status 08/06/2019 FINAL  Final  SARS Coronavirus 2 by RT PCR (hospital order, performed in [facility] hospital lab) Nasopharyngeal Nasopharyngeal Swab     Status: None   Collection Time: 08/05/19  8:15 PM   Specimen: Nasopharyngeal Swab  Result Value Ref Range Status   SARS Coronavirus 2 NEGATIVE NEGATIVE Final    Comment:  (NOTE) SARS-CoV-2 target nucleic acids are NOT DETECTED.  The SARS-CoV-2 RNA is generally detectable in upper and lower respiratory specimens during the acute phase of infection. The lowest concentration of SARS-CoV-2 viral copies this assay can detect is 250 copies /  mL. A negative result does not preclude SARS-CoV-2 infection and should not be used as the sole basis for treatment or other patient management decisions.  A negative result may occur with improper specimen collection / handling, submission of specimen other than nasopharyngeal swab, presence of viral mutation(s) within the areas targeted by this assay, and inadequate number of viral copies (<250 copies / mL). A negative result must be combined with clinical observations, patient history, and epidemiological information.  Fact Sheet for Patients:   StrictlyIdeas.no  Fact Sheet for Healthcare Providers: BankingDealers.co.za  This test is not yet approved or  cleared by the [place] FDA and has been authorized for detection and/or diagnosis of SARS-CoV-2 by FDA under an Emergency Use Authorization (EUA).  This EUA will remain in effect (meaning this test can be used) for the duration of the COVID-19 declaration under Section 564(b)(1) of the Act, 21 U.S.C. section 360bbb-3(b)(1), unless the authorization is terminated or revoked sooner.  Performed at [facility], [place] [address], [place], [place] [address]   MRSA PCR Screening     Status: None   Collection Time: 08/06/19 11:55 PM   Specimen: Nasal Mucosa; Nasopharyngeal  Result Value Ref Range Status   MRSA by PCR NEGATIVE NEGATIVE Final    Comment:        The GeneXpert MRSA Assay (FDA approved for NASAL specimens only), is one component of a comprehensive MRSA colonization surveillance program. It is not intended to diagnose MRSA infection nor to guide or monitor treatment for MRSA  infections. Performed at [facility], [place] [address] [place], [place] [address]     Please note: You were cared for by a hospitalist during your hospital stay. Once you are discharged, your primary care physician will handle any further medical issues. Please note that NO REFILLS for any discharge medications will be authorized once you are discharged, as it is imperative that you return to your primary care physician (or establish a relationship with a primary care physician if you do not have one) for your post hospital discharge needs so that they can reassess your need for medications and monitor your lab values.    Time coordinating discharge: 40 minutes  SIGNED:   [name], MD  [facility] 08/08/2019, 12:33 PM Pager [phone]  If 7PM-7AM, please contact night-coverage [email or web address] Password [email or web address]

## 2019-08-08 NOTE — TOC Progression Note (Signed)
Transition of Care [facility]) - Progression Note    Patient Details  Name: Tina Watkins MRN: [id number] Date of Birth: 10-Nov-1965  [facility] CM/SW Contact  Angelita Ingles, RN Phone Number: [phone]  08/08/2019, 3:56 PM  Clinical Narrative:    CM received call from CM office stating that patients mother would like to speak with the case manager. Case manager went to patients room to get permission to speak with mother. Upon entering room CM was made aware that patient was to be discharged this evening. CM made patient aware that home health pt could not be set up due to no accepting agency for medicaid. Patient states that she was on the phone with [facility] because that's how you get a home health aide. Patient made aware that MD has written orders to discharge patient. Patient states that she is unable to walk. MD made aware. MD on unit to talk with patient. CM will now begin workup for rehab. List of choices given to patient. Primary nurse made aware.   CM attempted to call mother at [phone] per verbal consent from patient. Mother does not answer phone. No message left will attempt to call again later.         Expected Discharge Plan and Services           Expected Discharge Date: 08/08/19                                     Social Determinants of Health (SDOH) Interventions    Readmission Risk Interventions Readmission Risk Prevention Plan 05/18/2019  Transportation Screening Complete  PCP or Specialist Appt within 3-5 Days Complete  HRI or [place] (No Data)  Medication Review [employer] photographer) Complete  Some recent data might be hidden

## 2019-08-08 NOTE — Progress Notes (Signed)
PT Cancellation Note  Patient Details Name: Tina Watkins MRN: [id number] DOB: 1965-12-03   Cancelled Treatment:    Reason Eval/Treat Not Completed: Other (comment)  Pt at HD earlier today.  Attempted in pm, but pt just returned from HD and fatigued.  Additionally, pt still in significant pain from gout just with bed mobility (assisted nurse tech in sliding pt).  PT will follow up when able.   Abran Richard, PT Acute Rehab Services Pager [phone] [facility] [place] 08/08/2019, 5:19 PM

## 2019-08-09 ENCOUNTER — Encounter (HOSPITAL_BASED_OUTPATIENT_CLINIC_OR_DEPARTMENT_OTHER): Payer: Medicaid Other | Admitting: Physician Assistant

## 2019-08-09 LAB — RENAL FUNCTION PANEL
Albumin: 2.6 g/dL — ABNORMAL LOW (ref 3.5–5.0)
Anion gap: 13 (ref 5–15)
BUN: 20 mg/dL (ref 6–20)
CO2: 24 mmol/L (ref 22–32)
Calcium: 8.4 mg/dL — ABNORMAL LOW (ref 8.9–10.3)
Chloride: 100 mmol/L (ref 98–111)
Creatinine, Ser: 2.71 mg/dL — ABNORMAL HIGH (ref 0.44–1.00)
GFR calc Af Amer: 23 mL/min — ABNORMAL LOW (ref >60)
GFR calc non Af Amer: 20 mL/min — ABNORMAL LOW (ref >60)
Glucose, Bld: 92 mg/dL (ref 70–99)
Phosphorus: 3.8 mg/dL (ref 2.5–4.6)
Potassium: 3.7 mmol/L (ref 3.5–5.1)
Sodium: 137 mmol/L (ref 135–145)

## 2019-08-09 LAB — CBC WITH DIFFERENTIAL/PLATELET
Abs Immature Granulocytes: 0.2 10*3/uL — ABNORMAL HIGH (ref 0.00–0.07)
Basophils Absolute: 0 10*3/uL (ref 0.0–0.1)
Basophils Relative: 0 %
Eosinophils Absolute: 0 10*3/uL (ref 0.0–0.5)
Eosinophils Relative: 0 %
HCT: 27 % — ABNORMAL LOW (ref 36.0–46.0)
Hemoglobin: 8.9 g/dL — ABNORMAL LOW (ref 12.0–15.0)
Lymphocytes Relative: 13 %
Lymphs Abs: 1.1 10*3/uL (ref 0.7–4.0)
MCH: 33.7 pg (ref 26.0–34.0)
MCHC: 33 g/dL (ref 30.0–36.0)
MCV: 102.3 fL — ABNORMAL HIGH (ref 80.0–100.0)
Metamyelocytes Relative: 2 %
Monocytes Absolute: 0.3 10*3/uL (ref 0.1–1.0)
Monocytes Relative: 4 %
Neutro Abs: 6.8 10*3/uL (ref 1.7–7.7)
Neutrophils Relative %: 81 %
Platelets: 103 10*3/uL — ABNORMAL LOW (ref 150–400)
RBC: 2.64 MIL/uL — ABNORMAL LOW (ref 3.87–5.11)
RDW: 22.1 % — ABNORMAL HIGH (ref 11.5–15.5)
WBC: 8.4 10*3/uL (ref 4.0–10.5)
nRBC: 0 % (ref 0.0–0.2)

## 2019-08-09 MED ORDER — CHLORHEXIDINE GLUCONATE CLOTH 2 % EX PADS
6.0000 | MEDICATED_PAD | Freq: Every day | CUTANEOUS | Status: DC
  Administered 2019-08-09 – 2019-08-10 (×2): 6 via TOPICAL

## 2019-08-09 MED ORDER — HYDROMORPHONE HCL 2 MG PO TABS
2.0000 mg | ORAL_TABLET | Freq: Four times a day (QID) | ORAL | Status: DC | PRN
  Administered 2019-08-09 – 2019-08-24 (×44): 2 mg via ORAL
  Filled 2019-08-09 (×45): qty 1

## 2019-08-09 NOTE — Progress Notes (Addendum)
PROGRESS NOTE    Tina Watkins  ZOX:[id number] DOB: 04/18/1965 DOA: 08/05/2019 PCP: [name], FNP   Brief Narrative: Patient is a 16-year female with history of ESRD status post renal transplant complicated by CKD stage IV on chronic steroids and immunosuppressive therapy, chronic anemia secondary to CKD, hypertension, TIA, chronic diastolic congestive heart failure, moderate pulmonary hypertension who presented with multiple joints pain which significantly limited her mobility. Symptoms started about a week ago. It involved right wrist and several fingers and right elbow and bilateral knees and ankles. She also had low-grade fever. Patient also developed productive cough with yellow sputum. She was started on hemodialysis this June because of failed kidney transplant.She has a right-sided femoral HD catheter for dialysis. On presentation, chest x-ray showed possible left lower lobe pneumonia. Admitted for further management.Startedon IV antibiotics and being also managed for acute gout flare.  Hospital course remained stable.  She was seen by PT and recommended home health on discharge.  Her pain has improved with pain medications and steroids.  Her respiratory status is stable and she is maintaining her saturation on room air.  She is hemodynamically stable for discharge to home with oral antibiotics and pain medications but patient states she does not have anybody at home to support.  She states she still has difficulty on ambulating.  As per case manager, home health cannot be arranged due to her insurance.  We have requested PT for reevaluation, for possible discharge to skilled nursing facility.Pending disposition.    Assessment & Plan:   Active Problems:   HCAP (healthcare-associated pneumonia)  Healthcare associated pneumonia: Presented with cough, new infiltrate on the chest x-ray.Leukocytosis.started on vancomycin and Zosyn.Elevated procalcitonin.  Presented with fever. Since MRSA PCR is negative, vancomycin stopped. C Afebrile now.  Cultures have remained negative.  Her respiratory status is stable.  Currently on room air.  Antibiotics changed to oral.  CKD stage V on hemodialysis:Nephrology following. She has right-sided femoral dialysis catheter.  She has extremely limited IV access on the upper extremities. She is following with vascular surgery as an outpatient for permanent access. Patient still produces urine.  She was dialyzed during this hospitalization.  Acute gouty flareup: Presented with pain on the multiple joints. Atypical presentation. Colchicine restarted due to severe pain.  Dose of steroid has been increased from her baseline dose for gout. Complains of severe pain mainly on her left ankle.   Continue tapering dose of prednisone.  Follow-up with rheumatology as an outpatient for the management of gout.  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 Her nephrologist is still recommending to continue steroid and tacrolimus. Imuran was discontinued last month due to worsening pancytopenia.  Orthostatic hypotension:On midodrine. BP soft. She has chronic hypotension.  Leukocytosis:Leukocytosis most likely associated with her acute gout flareup. Stable  Anemia of chronic disease/thrombocytopenia: Associated with CKD. Currently hemoglobin stable. She has chronic macrocytosis. Normalvitamin B12 and folate.  Chronic diastolic heart failure: Volume managed by dialysis. Last echo on 12/20 showed restrictive cardiomyopathy, grade 3 diastolic dysfunction, preserved ejection fraction, moderate pulmonary hypertension  History of Covid infection:On April 2019. Presented with Covid pneumonia and had prolonged hospitalization.  Debility/deconditioning:Physical therapy evaluationdone and recommended HHPT, but due to lack of insurance, home health cannot be arranged.   Patient does not have any support at home and she still has difficulty ambulation.  PT evaluation requested.     Nutrition Problem: Increased nutrient needs Etiology: chronic illness (ESRD on HD, CHF)  DVT prophylaxis: Heparin subcu Code Status: Full code Family Communication: None present at the bedside Status is: Inpatient  Remains inpatient appropriate because:unsafe DC plan  Dispo: The patient is from: Home              Anticipated d/c is to: Home vs SNF              Anticipated d/c date is: 1 days              Patient currently is medically stable for discharge to home  Consultants: Nephrology  Procedures: Hemodialysis  Antimicrobials:  Anti-infectives (From admission, onward)   Start     Dose/Rate Route Frequency Ordered Stop   08/08/19 1200  vancomycin (VANCOCIN) IVPB 500 mg/100 ml premix  Status:  Discontinued        500 mg 100 mL/hr over 60 Minutes Intravenous Every T-Th-Sa (Hemodialysis) 08/05/19 1522 08/07/19 0748   08/08/19 1200  amoxicillin-clavulanate (AUGMENTIN) 500-125 MG per tablet 500 mg     Discontinue     1 tablet Oral 2 times daily 08/08/19 1101     08/08/19 0000  amoxicillin-clavulanate (AUGMENTIN) 500-125 MG tablet     Discontinue     1 tablet Oral 2 times daily 08/08/19 1233     08/05/19 2030  piperacillin-tazobactam (ZOSYN) IVPB 2.25 g  Status:  Discontinued        2.25 g 100 mL/hr over 30 Minutes Intravenous Every 8 hours 08/05/19 1522 08/08/19 1101   08/05/19 1230  vancomycin (VANCOCIN) IVPB 1000 mg/200 mL premix        1,000 mg 200 mL/hr over 60 Minutes Intravenous  Once 08/05/19 1219 08/05/19 1412   08/05/19 1230  piperacillin-tazobactam (ZOSYN) IVPB 3.375 g        3.375 g 100 mL/hr over 30 Minutes Intravenous  Once 08/05/19 1219 08/05/19 1328      Subjective: Patient seen and examined at the bedside this morning.  While I entered the room, she was talking on phone, very comfortable.  When asked, she complains of severe pain and says  she cannot step on her foot.  She continues to say that she does not have any support at home and she cannot be discharged to home.  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 No JVD, murmurs, rubs, gallops or clicks. Gastrointestinal system: Abdomen is nondistended, soft and nontender. No organomegaly or masses felt. Normal bowel sounds heard. Central nervous system: Alert and oriented. No focal neurological deficits. Extremities: Trace edema on all extremities edema, no clubbing ,no cyanosis,  temporary dialysis catheter on the right groin  Skin: No rashes, lesions or ulcers,no icterus ,no  pallor    Data Reviewed: I have personally reviewed following labs and imaging studies  CBC: Recent Labs  Lab 08/05/19 1206 08/06/19 0815 08/07/19 0619 08/08/19 0357 08/09/19 0723  WBC 11.9* 14.4* 13.5* 11.1* 8.4  NEUTROABS 10.2*  --  12.3* 10.2* 6.8  HGB 9.0* 9.2* 9.5* 9.4* 8.9*  HCT 28.9* 29.3* 29.7* 30.3* 27.0*  MCV 109.5* 106.2* 107.6* 108.2* 102.3*  PLT 89* 71* 94* 102* 811*   Basic Metabolic Panel: Recent Labs  Lab 08/05/19 1206 08/06/19 0815 08/07/19 0619 08/08/19 0357 08/09/19 0302  NA 135 137 136 138 137  K 4.5 3.7 4.3 4.6 3.7  CL 98 100 101 99 100  CO2 26 23 23 27 24   GLUCOSE 91 84 132* 109* 92  BUN 31* 14  31* 26* 20  CREATININE 4.60* 2.41* 4.00* 3.38* 2.71*  CALCIUM 8.7* 8.2* 8.4* 8.7* 8.4*  PHOS  --   --  5.5* 5.1* 3.8   GFR: Estimated Creatinine Clearance: 18.4 mL/min (A) (by C-G formula based on SCr of 2.71 mg/dL (H)). Liver Function Tests: Recent Labs  Lab 08/05/19 1206 08/07/19 0619 08/08/19 0357 08/09/19 0302  AST 25  --   --   --   ALT 25  --   --   --   ALKPHOS 117  --   --   --   BILITOT 2.5*  --   --   --   PROT 5.4*  --   --   --   ALBUMIN 3.1* 2.5* 2.7* 2.6*   No results for input(s): LIPASE, AMYLASE in the last 168 hours. No results for input(s): AMMONIA in the last 168 hours. Coagulation Profile: No results for input(s): INR, PROTIME in the last 168 hours. Cardiac Enzymes: No results for input(s): CKTOTAL, CKMB, CKMBINDEX, TROPONINI in the last 168 hours. BNP (last 3 results) No results for input(s): PROBNP in the last 8760 hours. HbA1C: No results for input(s): HGBA1C in the last 72 hours. CBG: No results for input(s): GLUCAP in the last 168 hours. Lipid Profile: No results for input(s): CHOL, HDL, LDLCALC, TRIG, CHOLHDL, LDLDIRECT in the last 72 hours. Thyroid Function Tests: No results for input(s): TSH, T4TOTAL, FREET4, T3FREE, THYROIDAB in the last 72 hours. Anemia Panel: Recent Labs    08/06/19 1359  VITAMINB12 744  FOLATE 7.2   Sepsis Labs: Recent Labs  Lab 08/05/19 1206 08/06/19 0815 08/07/19 0619  PROCALCITON 2.81 5.49 11.50  LATICACIDVEN 1.3  --   --     Recent Results (from the past 240 hour(s))  Blood Culture (routine x 2)     Status: None (Preliminary result)   Collection Time: 08/05/19 12:05 PM   Specimen: BLOOD  Result Value Ref Range Status   Specimen Description BLOOD RIGHT ANTECUBITAL  Final   Special Requests   Final    BOTTLES DRAWN AEROBIC AND ANAEROBIC Blood Culture adequate volume   Culture   Final    NO GROWTH 4 DAYS Performed at [facility], [place] [address] [place], [place] [address]    Report Status  PENDING  Incomplete  Blood Culture (routine x 2)     Status: None (Preliminary result)   Collection Time: 08/05/19  2:31 PM   Specimen: BLOOD LEFT ARM  Result Value Ref Range Status   Specimen Description BLOOD LEFT ARM  Final   Special Requests   Final    BOTTLES DRAWN AEROBIC ONLY Blood Culture results may not be optimal due to an inadequate volume of  blood received in culture bottles   Culture   Final    NO GROWTH 4 DAYS Performed at [facility], [place] [address], [place], [place] [address]    Report Status PENDING  Incomplete  Urine culture     Status: None   Collection Time: 08/05/19  6:36 PM   Specimen: In/Out Cath Urine  Result Value Ref Range Status   Specimen Description IN/OUT CATH URINE  Final   Special Requests NONE  Final   Culture   Final    NO GROWTH Performed at [facility], [place] [address] [place], [place] [address]    Report Status 08/06/2019 FINAL  Final  SARS Coronavirus 2 by RT PCR (hospital order, performed in [facility] hospital lab) Nasopharyngeal Nasopharyngeal Swab     Status: None   Collection Time: 08/05/19  8:15 PM   Specimen: Nasopharyngeal Swab  Result Value Ref Range Status   SARS Coronavirus 2 NEGATIVE NEGATIVE Final    Comment: (NOTE) SARS-CoV-2 target nucleic acids are NOT DETECTED.  The SARS-CoV-2 RNA is generally detectable in upper and lower respiratory specimens during the acute phase of infection. The lowest concentration of SARS-CoV-2 viral copies this assay can detect is 250 copies / mL. A negative result does not preclude SARS-CoV-2 infection and should not be used as the sole basis for treatment or other patient management decisions.  A negative result may occur with improper specimen collection / handling, submission of specimen other than nasopharyngeal swab, presence of viral mutation(s) within the areas targeted by this assay, and inadequate number of viral copies (<250 copies / mL). A negative result must be  combined with clinical observations, patient history, and epidemiological information.  Fact Sheet for Patients:   StrictlyIdeas.no  Fact Sheet for Healthcare Providers: BankingDealers.co.za  This test is not yet approved or  cleared by the [place] FDA and has been authorized for detection and/or diagnosis of SARS-CoV-2 by FDA under an Emergency Use Authorization (EUA).  This EUA will remain in effect (meaning this test can be used) for the duration of the COVID-19 declaration under Section 564(b)(1) of the Act, 21 U.S.C. section 360bbb-3(b)(1), unless the authorization is terminated or revoked sooner.  Performed at [facility], [place] [address] [place], [place] [address]   MRSA PCR Screening     Status: None   Collection Time: 08/06/19 11:55 PM   Specimen: Nasal Mucosa; Nasopharyngeal  Result Value Ref Range Status   MRSA by PCR NEGATIVE NEGATIVE Final    Comment:        The GeneXpert MRSA Assay (FDA approved for NASAL specimens only), is one component of a comprehensive MRSA colonization surveillance program. It is not intended to diagnose MRSA infection nor to guide or monitor treatment for MRSA infections. Performed at [facility], [place] [address] [place], [place] [address]          Radiology Studies: No results found.      Scheduled Meds: . amoxicillin-clavulanate  1 tablet Oral BID  . aspirin EC  81 mg Oral Daily  . Chlorhexidine Gluconate Cloth  6 each Topical Q0600  . Chlorhexidine Gluconate Cloth  6 each Topical Q0600  . Chlorhexidine Gluconate Cloth  6 each Topical Q0600  . colchicine  0.3 mg Oral Daily  . darbepoetin (ARANESP) injection - DIALYSIS  100 mcg Intravenous Q Tue-HD  . famotidine  20 mg Oral Daily  . feeding supplement (NEPRO CARB STEADY)  237 mL Oral  BID BM  . feeding supplement (PRO-STAT SUGAR FREE 64)  30 mL Oral BID  . gabapentin  100 mg Oral BID  . guaiFENesin  600  mg Oral BID  . heparin  5,000 Units Subcutaneous Q12H  . latanoprost  1 drop Left Eye QHS  . midodrine  10 mg Oral BID WC  . multivitamin  1 tablet Oral QHS  . predniSONE  40 mg Oral Q breakfast  . tacrolimus  2 mg Oral QHS  . tacrolimus  3 mg Oral Daily  . Vitamin D (Ergocalciferol)  50,000 Units Oral Q Sun   Continuous Infusions:    LOS: 4 days    Time spent: 35 mins,More than 50% of that time was spent in counseling and/or coordination of care.      [name], MD [facility] P7/08/2019, 12:15 PM

## 2019-08-09 NOTE — Progress Notes (Signed)
[facility] NEPHROLOGY PROGRESS NOTE  Assessment/ Plan: Pt is a 54 y.o. yo female with hypertension, CHF, ESRD failed kidney transplant, now on dialysis with pneumonia.  OP HD:[facility] TTS: 4h  57.5kg (down to 56 last HD)  2/2 bath Hep 1000  Fem TDC R,  mircera 100 q2 wk last 6/24.  #HCAP- suspected LLL PNA, treated with IV vancomycin and Zosyn.  Clinically improved.  Now antibiotics changed to Augmentin.  #Acute gout flare: On colchicine and steroid.  # ESRD / failed renal Tx - TTS HD.  She has been getting serial dialysis for volume overload.  Status post HD yesterday with only 809 cc UF.  Plan for next HD tomorrow. UF limited by hypotension.  #Hypotension/Vol overload: Chronic hypotension therefore on midodrine.  Monitor BP.   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  Recommend to change prednisone to 5 mg on discharge.  # Anemia of ckd: Started Aranesp.  No iron because of infection.  # MBD ckd: Calcium phosphorus okay.  Monitor lab.  # H/o COVID infection - April 2019here w/ COVID pna and prolonged admit. PCR COVID negative last night.   Subjective: Seen and examined at bedside.  Complaining of generalized body pain including legs.  Denies nausea vomiting chest pain shortness of breath.  Objective Vital signs in last 24 hours: Vitals:   08/08/19 1643 08/08/19 2031 08/09/19 0638 08/09/19 0805  BP:  126/70 (!) 108/59 (!) 98/56  Pulse:  (!) 58 (!) 56 (!) 55  Resp: 19 (!) 25  16  Temp:  98.8 F (37.1 C) 98.2 F (36.8 C) 98.2 F (36.8 C)  TempSrc:  Oral Oral Oral  SpO2:  99% 98% 100%  Weight:      Height:       Weight change: -1.7 kg  Intake/Output Summary (Last 24 hours) at 08/09/2019 0953 Last data filed at 08/08/2019 2032 Gross per 24 hour  Intake 240 ml  Output 909 ml  Net -669 ml       Labs: Basic Metabolic Panel: Recent Labs  Lab 08/07/19 0619 08/08/19 0357 08/09/19 0302  NA 136 138 137  K 4.3 4.6 3.7   CL 101 99 100  CO2 23 27 24   GLUCOSE 132* 109* 92  BUN 31* 26* 20  CREATININE 4.00* 3.38* 2.71*  CALCIUM 8.4* 8.7* 8.4*  PHOS 5.5* 5.1* 3.8   Liver Function Tests: Recent Labs  Lab 08/05/19 1206 08/05/19 1206 08/07/19 0619 08/08/19 0357 08/09/19 0302  AST 25  --   --   --   --   ALT 25  --   --   --   --   ALKPHOS 117  --   --   --   --   BILITOT 2.5*  --   --   --   --   PROT 5.4*  --   --   --   --   ALBUMIN 3.1*   < > 2.5* 2.7* 2.6*   < > = values in this interval not displayed.   No results for input(s): LIPASE, AMYLASE in the last 168 hours. No results for input(s): AMMONIA in the last 168 hours. CBC: Recent Labs  Lab 08/05/19 1206 08/05/19 1206 08/06/19 0815 08/06/19 0815 08/07/19 0619 08/08/19 0357 08/09/19 0723  WBC 11.9*   < > 14.4*   < > 13.5* 11.1* 8.4  NEUTROABS 10.2*   < >  --   --  12.3* 10.2*  6.8  HGB 9.0*   < > 9.2*   < > 9.5* 9.4* 8.9*  HCT 28.9*   < > 29.3*   < > 29.7* 30.3* 27.0*  MCV 109.5*  --  106.2*  --  107.6* 108.2* 102.3*  PLT 89*   < > 71*   < > 94* 102* 103*   < > = values in this interval not displayed.   Cardiac Enzymes: No results for input(s): CKTOTAL, CKMB, CKMBINDEX, TROPONINI in the last 168 hours. CBG: No results for input(s): GLUCAP in the last 168 hours.  Iron Studies: No results for input(s): IRON, TIBC, TRANSFERRIN, FERRITIN in the last 72 hours. Studies/Results: No results found.  Medications: Infusions:   Scheduled Medications: . amoxicillin-clavulanate  1 tablet Oral BID  . aspirin EC  81 mg Oral Daily  . Chlorhexidine Gluconate Cloth  6 each Topical Q0600  . Chlorhexidine Gluconate Cloth  6 each Topical Q0600  . colchicine  0.3 mg Oral Daily  . darbepoetin (ARANESP) injection - DIALYSIS  100 mcg Intravenous Q Tue-HD  . famotidine  20 mg Oral Daily  . feeding supplement (NEPRO CARB STEADY)  237 mL Oral BID BM  . feeding supplement (PRO-STAT SUGAR FREE 64)  30 mL Oral BID  . gabapentin  100 mg Oral BID  .  guaiFENesin  600 mg Oral BID  . heparin  5,000 Units Subcutaneous Q12H  . latanoprost  1 drop Left Eye QHS  . midodrine  10 mg Oral BID WC  . multivitamin  1 tablet Oral QHS  . predniSONE  40 mg Oral Q breakfast  . tacrolimus  2 mg Oral QHS  . tacrolimus  3 mg Oral Daily  . Vitamin D (Ergocalciferol)  50,000 Units Oral Q Sun    have reviewed scheduled and prn medications.  Physical Exam: General:NAD, comfortable. Heart:RRR, s1s2 nl, no rubs Lungs: Some basal rhonchi. Abdomen:soft, Non-tender, non-distended Extremities: Both upper and lower extremity edema present Dialysis Access: Right femoral HD catheter  Dalaina Tates Tanna Furry 08/09/2019,9:53 AM  LOS: 4 days  Pager: [phone]

## 2019-08-09 NOTE — Progress Notes (Signed)
Physical Therapy Treatment Patient Details Name: Tina Watkins MRN: [id number] DOB: 03/02/1965 Today's Date: 08/09/2019    History of Present Illness Patient is a 64-year female with history of ESRD status post renal transplant complicated by CKD stage IV on chronic steroids and immunosuppressive therapy, chronic anemia secondary to CKD, hypertension, TIA, chronic diastolic congestive heart failure, moderate pulmonary hypertension who presented with multiple joints pain which significantly limited her mobility.  Recently re-started on HD due to kidney failure, now with R femoral HD cath.    PT Comments    Pt in significant pain due to gout flare, but agreeable to bed-level exercises and transfer training. Pt able to complete x2 sit to stands with mod PT assist, and pt unable to transfer to [facility] with stand pivot so bed moved out of the way for placement of BSC. Pt with dizziness sitting EOB post-BM, resolved with return to supine. Pt understands her functional mobility limitations at present, will require SNF level of care post-acutely from PT standpoint. Pt understands, but wants to think about it. Will continue to follow acutely.    Follow Up Recommendations  Supervision/Assistance - 24 hour;SNF     Equipment Recommendations  None recommended by PT (refusing w/c)    Recommendations for Other Services       Precautions / Restrictions Precautions Precautions: Fall Precaution Comments: R femoral HD cath (RN and pt report okay to sit)    Mobility  Bed Mobility Overal bed mobility: Needs Assistance Bed Mobility: Supine to Sit;Sit to Supine     Supine to sit: Min assist;HOB elevated Sit to supine: Min assist;HOB elevated   General bed mobility comments: min assist for trunk and LE management, positioning in bed upon return to supine.  Transfers Overall transfer level: Needs assistance Equipment used: Rolling walker (2 wheeled) Transfers: Stand Pivot Transfers Sit to  Stand: Mod assist;From elevated surface         General transfer comment: Mod assist for power up, steadying, VERY increased time to rise to standing with inability to bring hips to neutral extension. stand x2, unable to pivot even with LLE so PT had to move bed and place [facility] directly behind pt in order to get to [facility] for BM. required total asssist for pericare.  Ambulation/Gait             General Gait Details: unable   Stairs             Wheelchair Mobility    Modified Rankin (Stroke Patients Only)       Balance Overall balance assessment: Needs assistance   Sitting balance-Leahy Scale: Fair     Standing balance support: Bilateral upper extremity supported Standing balance-Leahy Scale: Poor                              Cognition Arousal/Alertness: Awake/alert Behavior During Therapy: WFL for tasks assessed/performed Overall Cognitive Status: Impaired/Different from baseline Area of Impairment: Attention;Following commands;Safety/judgement;Problem solving                   Current Attention Level: Selective   Following Commands: Follows one step commands consistently Safety/Judgement: Decreased awareness of safety;Decreased awareness of deficits   Problem Solving: Slow processing;Difficulty sequencing;Requires verbal cues;Decreased initiation;Requires tactile cues General Comments: requires increased time and multimodal cuing for mobility, difficulty understanding mobility deficits' carryover into functional mobility at home      Exercises General Exercises - Lower Extremity Ankle Circles/Pumps: AAROM;Both;10  reps;Supine Quad Sets: AAROM;Right;5 reps;Supine Heel Slides: AROM;Right;5 reps;Supine    General Comments        Pertinent Vitals/Pain Pain Location: bilateral feet R>L, R knee 2* gout flare Pain Descriptors / Indicators: Aching;Sharp;Discomfort Pain Intervention(s): Limited activity within patient's tolerance;Monitored  during session;Repositioned    Home Living                      Prior Function            PT Goals (current goals can now be found in the care plan section) Acute Rehab PT Goals Patient Stated Goal: go home PT Goal Formulation: With patient Time For Goal Achievement: 08/20/19 Potential to Achieve Goals: Fair Progress towards PT goals: Progressing toward goals    Frequency    Min 3X/week      PT Plan Discharge plan needs to be updated    Co-evaluation              AM-PAC PT "6 Clicks" Mobility   Outcome Measure  Help needed turning from your back to your side while in a flat bed without using bedrails?: A Little Help needed moving from lying on your back to sitting on the side of a flat bed without using bedrails?: A Little Help needed moving to and from a bed to a chair (including a wheelchair)?: A Lot Help needed standing up from a chair using your arms (e.g., wheelchair or bedside chair)?: A Little Help needed to walk in hospital room?: Total Help needed climbing 3-5 steps with a railing? : Total 6 Click Score: 13    End of Session Equipment Utilized During Treatment: Gait belt Activity Tolerance: Patient limited by pain Patient left: in bed;with call bell/phone within reach;with bed alarm set Nurse Communication: Mobility status PT Visit Diagnosis: Unsteadiness on feet (R26.81);Pain;Muscle weakness (generalized) (M62.81) Pain - Right/Left: Right Pain - part of body: Ankle and joints of foot     Time: 4492-0100 PT Time Calculation (min) (ACUTE ONLY): 38 min  Charges:  $Therapeutic Exercise: 8-22 mins $Therapeutic Activity: 8-22 mins                     Tina Watkins E, PT Acute Rehabilitation Services Pager [phone]  Office [phone]  Tina Watkins D Tina Watkins 08/09/2019, 12:14 PM

## 2019-08-10 LAB — CULTURE, BLOOD (ROUTINE X 2)
Culture: NO GROWTH
Culture: NO GROWTH
Special Requests: ADEQUATE

## 2019-08-10 MED ORDER — PREDNISONE 20 MG PO TABS
20.0000 mg | ORAL_TABLET | Freq: Every day | ORAL | Status: DC
  Administered 2019-08-11 – 2019-08-12 (×2): 20 mg via ORAL
  Filled 2019-08-10 (×2): qty 1

## 2019-08-10 MED ORDER — HEPARIN SODIUM (PORCINE) 1000 UNIT/ML IJ SOLN
INTRAMUSCULAR | Status: AC
  Administered 2019-08-10: 4200 [IU]
  Filled 2019-08-10: qty 4

## 2019-08-10 NOTE — NC FL2 (Signed)
[place] LEVEL OF CARE SCREENING TOOL     IDENTIFICATION  Patient Name: Tina Watkins Birthdate: Jun 16, 1965 Sex: female Admission Date (Current Location): 08/05/2019  [facility] and [place] Number:  Herbalist and Address:  [facility] [place] [address], [place], [place] [address]      Provider Number: [id number]  Attending Physician Name and Address:  [name], MD  Relative Name and Phone Number:  Oletta Lamas [phone]    Current Level of Care: Hospital Recommended Level of Care: [place] Prior Approval Number:    Date Approved/Denied:   PASRR Number:    Discharge Plan: SNF    Current Diagnoses: Patient Active Problem List   Diagnosis Date Noted  . HCAP (healthcare-associated pneumonia) 08/05/2019  . Pressure injury of skin 07/24/2019  . Chest congestion 07/24/2019  . Itching 07/24/2019  . Weakness 06/13/2019  . Pneumonia due to COVID-19 virus 05/09/2019  . Hypotension 05/09/2019  . Symptomatic anemia 05/09/2019  . CHF (congestive heart failure) ([place]) 05/09/2019  . Swollen abdomen 01/04/2019  . DDD (degenerative disc disease), cervical 12/06/2018  . Neuropathy 12/06/2018  . Paresthesia 10/11/2018  . Neck pain 10/11/2018  . Tobacco abuse 11/20/2017  . Right leg swelling 11/20/2017  . CKD (chronic kidney disease), stage III 11/20/2017  . Right leg pain 11/20/2017  . Stroke (cerebrum) ([place]) 11/20/2017  . GERD (gastroesophageal reflux disease) 11/20/2017  . Acute venous embolism and thrombosis of deep vessels of proximal lower extremity, right ([place]) 11/20/2017  . 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 Thrush, oral   . AKI (acute kidney injury) ([place])   . Multifocal pneumonia 08/09/2017  . Chest pain 09/29/2015  . Cervical stenosis of spinal canal 01/08/2015  . Urinary tract infectious disease   . Muscle spasms of neck 06/15/2014  .  Bleeding hemorrhoid 06/15/2014  . Anemia in chronic kidney disease 06/15/2014  . Pyelonephritis, acute 06/13/2014  . Sepsis ([place]) 06/13/2014  . History of kidney transplant   . Chronic pain syndrome 04/09/2012  . Dehydration, mild 04/09/2012  . Gout attack 06/23/2011  . Herpes infection 06/23/2011  . Anxiety 06/23/2011  . E coli bacteremia 06/18/2011  . ESRD (end stage renal disease) ([place]) 06/12/2011  . UTI (lower urinary tract infection) 06/12/2011  . Bacteremia due to Gram-negative bacteria 05/23/2011  . History of renal transplantation 05/22/2011  . Septic shock(785.52) 05/22/2011  . Acute on chronic kidney failure ([place]) 05/22/2011  . Gastroparesis 04/24/2008  . WEIGHT LOSS 08/24/2007  . NAUSEA AND VOMITING 08/24/2007    Orientation RESPIRATION BLADDER Height & Weight     Self, Time, Situation, Place  Normal Continent Weight: 53.6 kg Height:  4\' 11"  (149.9 cm)  BEHAVIORAL SYMPTOMS/MOOD NEUROLOGICAL BOWEL NUTRITION STATUS    Convulsions/Seizures (post transplant only once in lifetime 12/11/2011) Continent Diet (Renal with 1200 ml fluid restriction)  AMBULATORY STATUS COMMUNICATION OF NEEDS Skin   Extensive Assist (Unable to tolerate due to gout pain) Verbally PU Stage and Appropriate Care   PU Stage 2 Dressing:  (foam dressing as needed)                   Personal Care Assistance Level of Assistance  Bathing, Dressing, Feeding, Total care Bathing Assistance: Limited assistance Feeding assistance: Independent (set up only) Dressing Assistance: Limited assistance Total Care Assistance: Limited assistance   Functional Limitations Info  Sight, Hearing, Speech Sight Info: 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) Hearing Info: Adequate Speech Info: Adequate    SPECIAL CARE FACTORS FREQUENCY  PT (By licensed PT), OT (By licensed OT)     PT Frequency: 5X OT Frequency: 5X            Contractures Contractures Info: Not present    Additional Factors Info  Code Status,  Allergies, Psychotropic, Insulin Sliding Scale, Isolation Precautions, Suctioning Needs Code Status Info: full Allergies Info: Levofloxacin / Tape Psychotropic Info: no Insulin Sliding Scale Info: none Isolation Precautions Info: none Suctioning Needs: n/a   Current Medications (08/10/2019):  This is the current hospital active medication list Current Facility-Administered Medications  Medication Dose Route Frequency Provider Last Rate Last Admin  . albuterol (PROVENTIL) (2.5 MG/3ML) 0.083% nebulizer solution 2.5 mg  2.5 mg Inhalation QID PRN Wynetta Fines T, MD      . albuterol (PROVENTIL) (2.5 MG/3ML) 0.083% nebulizer solution 2.5 mg  2.5 mg Nebulization Q2H PRN Wynetta Fines T, MD      . amoxicillin-clavulanate (AUGMENTIN) 500-125 MG per tablet 500 mg  1 tablet Oral BID [name], MD   500 mg at 08/09/19 2303  . aspirin EC tablet 81 mg  81 mg Oral Daily Wynetta Fines T, MD   81 mg at 08/10/19 0910  . Chlorhexidine Gluconate Cloth 2 % PADS 6 each  6 each Topical Q0600 Rosita Fire, MD   6 each at 08/10/19 [phone]  . Chlorhexidine Gluconate Cloth 2 % PADS 6 each  6 each Topical Q0600 Rosita Fire, MD   6 each at 08/10/19 [phone]  . Chlorhexidine Gluconate Cloth 2 % PADS 6 each  6 each Topical Q0600 Rosita Fire, MD   6 each at 08/10/19 [phone]  . colchicine tablet 0.3 mg  0.3 mg Oral Daily [name], MD   0.3 mg at 08/10/19 0911  . Darbepoetin Alfa (ARANESP) injection 100 mcg  100 mcg Intravenous Q Tue-HD Rosita Fire, MD   100 mcg at 08/08/19 1211  . diphenhydrAMINE (BENADRYL) capsule 25 mg  25 mg Oral Q8H PRN Wynetta Fines T, MD      . famotidine (PEPCID) tablet 20 mg  20 mg Oral Daily Wynetta Fines T, MD   20 mg at 08/10/19 0911  . feeding supplement (NEPRO CARB STEADY) liquid 237 mL  237 mL Oral BID BM [name], MD   237 mL at 08/10/19 0913  . feeding supplement (PRO-STAT SUGAR FREE 64) liquid 30 mL  30 mL Oral BID [name], MD   30 mL at 08/10/19  0910  . fluticasone (FLONASE) 50 MCG/ACT nasal spray 1 spray  1 spray Each Nare Daily PRN Wynetta Fines T, MD      . gabapentin (NEURONTIN) capsule 100 mg  100 mg Oral BID Wynetta Fines T, MD   100 mg at 08/10/19 0911  . guaiFENesin (MUCINEX) 12 hr tablet 600 mg  600 mg Oral BID Wynetta Fines T, MD   600 mg at 08/10/19 0913  . heparin injection 5,000 Units  5,000 Units Subcutaneous Q12H Lequita Halt, MD   5,000 Units at 08/10/19 0912  . HYDROmorphone (DILAUDID) tablet 2 mg  2 mg Oral Q6H PRN [name], MD   2 mg at 08/10/19 0908  . latanoprost (XALATAN) 0.005 % ophthalmic solution 1 drop  1 drop Left Eye QHS Lequita Halt, MD   1 drop at 08/09/19 2304  . lidocaine (LIDODERM) 5 % 1 patch  1 patch Transdermal Daily PRN Wynetta Fines  T, MD      . midodrine (PROAMATINE) tablet 10 mg  10 mg Oral BID WC Rosita Fire, MD   10 mg at 08/10/19 0910  . multivitamin (RENA-VIT) tablet 1 tablet  1 tablet Oral QHS [name], MD   1 tablet at 08/09/19 2305  . nystatin (MYCOSTATIN) 100000 UNIT/ML suspension 500,000 Units  5 mL Mouth/Throat Daily PRN Wynetta Fines T, MD      . ondansetron (ZOFRAN-ODT) disintegrating tablet 4 mg  4 mg Oral Q8H PRN Lequita Halt, MD   4 mg at 08/06/19 2037  . predniSONE (DELTASONE) tablet 40 mg  40 mg Oral Q breakfast [name], MD   40 mg at 08/10/19 0911  . tacrolimus (PROGRAF) capsule 2 mg  2 mg Oral QHS Wynetta Fines T, MD   2 mg at 08/09/19 2303  . tacrolimus (PROGRAF) capsule 3 mg  3 mg Oral Daily Wynetta Fines T, MD   3 mg at 08/10/19 0909  . Vitamin D (Ergocalciferol) (DRISDOL) capsule 50,000 Units  50,000 Units Oral Q Kathaleen Grinder, Ralene Cork, MD   50,000 Units at 08/06/19 0835  . zolpidem (AMBIEN) tablet 10 mg  10 mg Oral QHS PRN Lequita Halt, MD   10 mg at 08/08/19 2058     Discharge Medications: Please see discharge summary for a list of discharge medications.  Relevant Imaging Results:  Relevant Lab Results:   Additional Information Hemodialysis patient  TTSa  /    SS# [id number]  Angelita Ingles, RN

## 2019-08-10 NOTE — Progress Notes (Signed)
Patient back in room from dialysis, resting comfortably.

## 2019-08-10 NOTE — TOC Progression Note (Signed)
Transition of Care [facility]) - Progression Note    Patient Details  Name: Nadia Viar MRN: [id number] Date of Birth: 1965/11/02  Transition of Care [facility]) CM/SW [place], RN Phone Number: [phone]  08/10/2019, 10:17 AM  Clinical Narrative:    Phoebe Perch completed, PASRR completed # [id number]. Patient info has been faxed out via [facility]. Will continue to workup for SNF placement         Expected Discharge Plan and Services           Expected Discharge Date: 08/08/19                                     Social Determinants of Health (SDOH) Interventions    Readmission Risk Interventions Readmission Risk Prevention Plan 05/18/2019  Transportation Screening Complete  PCP or Specialist Appt within 3-5 Days Complete  HRI or [place] (No Data)  Medication Review Press photographer) Complete  Some recent data might be hidden

## 2019-08-10 NOTE — Progress Notes (Signed)
Patient off floor to dialysis

## 2019-08-10 NOTE — Progress Notes (Signed)
[facility] NEPHROLOGY PROGRESS NOTE  Assessment/ Plan: Pt is a 54 y.o. yo female with hypertension, CHF, ESRD failed kidney transplant, now on dialysis with pneumonia.  OP HD:[facility] TTS: 4h  57.5kg (down to 56 last HD)  2/2 bath Hep 1000  Fem TDC R,  mircera 100 q2 wk last 6/24.  #HCAP- suspected LLL PNA, treated with IV vancomycin and Zosyn.  Clinically improved.  Now antibiotics changed to Augmentin.  #Acute gout flare: On colchicine and steroid.  # ESRD / failed renal Tx - TTS HD.  She has been getting serial dialysis for volume overload.  Plan for HD today.  Midodrine and albumin for intradialytic hypotension.  UF as tolerated.  #Hypotension/Vol overload: Chronic hypotension therefore on midodrine.  Monitor BP.   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  Recommend to change prednisone to 5 mg on discharge.  # Anemia of ckd: Started Aranesp.  No iron because of infection.  # MBD ckd: Calcium phosphorus okay.  Monitor lab.  # H/o COVID infection - April 2019here w/ COVID pna and prolonged admit. PCR COVID negative last night.   Subjective: Seen and examined at bedside.  No new event.  Denies nausea vomiting chest pain shortness of breath.  Overall not feeling well and complaining of generalized body pain.  Objective Vital signs in last 24 hours: Vitals:   08/09/19 1445 08/09/19 1822 08/09/19 2216 08/10/19 0800  BP: 110/64 112/61 103/62 (!) 107/58  Pulse: (!) 56 67 (!) 55   Resp: 18 18 16 18   Temp: 98 F (36.7 C) 98.3 F (36.8 C) 98.3 F (36.8 C) 98.4 F (36.9 C)  TempSrc: Oral Oral Oral Oral  SpO2: 97% 98% 95% 91%  Weight:      Height:       Weight change:  No intake or output data in the 24 hours ending 08/10/19 1038     Labs: Basic Metabolic Panel: Recent Labs  Lab 08/08/19 0357 08/09/19 0302 08/10/19 0644  NA 138 137 136  K 4.6 3.7 3.6  CL 99 100 98  CO2 27 24 25   GLUCOSE 109* 92 100*  BUN 26* 20  33*  CREATININE 3.38* 2.71* 4.30*  CALCIUM 8.7* 8.4* 8.9  PHOS 5.1* 3.8 4.5   Liver Function Tests: Recent Labs  Lab 08/05/19 1206 08/07/19 0619 08/08/19 0357 08/09/19 0302 08/10/19 0644  AST 25  --   --   --   --   ALT 25  --   --   --   --   ALKPHOS 117  --   --   --   --   BILITOT 2.5*  --   --   --   --   PROT 5.4*  --   --   --   --   ALBUMIN 3.1*   < > 2.7* 2.6* 2.8*   < > = values in this interval not displayed.   No results for input(s): LIPASE, AMYLASE in the last 168 hours. No results for input(s): AMMONIA in the last 168 hours. CBC: Recent Labs  Lab 08/05/19 1206 08/05/19 1206 08/06/19 0815 08/06/19 0815 08/07/19 0619 08/08/19 0357 08/09/19 0723  WBC 11.9*   < > 14.4*   < > 13.5* 11.1* 8.4  NEUTROABS 10.2*   < >  --   --  12.3* 10.2* 6.8  HGB 9.0*   < > 9.2*   < > 9.5* 9.4* 8.9*  HCT 28.9*   < >  29.3*   < > 29.7* 30.3* 27.0*  MCV 109.5*  --  106.2*  --  107.6* 108.2* 102.3*  PLT 89*   < > 71*   < > 94* 102* 103*   < > = values in this interval not displayed.   Cardiac Enzymes: No results for input(s): CKTOTAL, CKMB, CKMBINDEX, TROPONINI in the last 168 hours. CBG: No results for input(s): GLUCAP in the last 168 hours.  Iron Studies: No results for input(s): IRON, TIBC, TRANSFERRIN, FERRITIN in the last 72 hours. Studies/Results: No results found.  Medications: Infusions:   Scheduled Medications: . amoxicillin-clavulanate  1 tablet Oral BID  . aspirin EC  81 mg Oral Daily  . Chlorhexidine Gluconate Cloth  6 each Topical Q0600  . Chlorhexidine Gluconate Cloth  6 each Topical Q0600  . Chlorhexidine Gluconate Cloth  6 each Topical Q0600  . colchicine  0.3 mg Oral Daily  . darbepoetin (ARANESP) injection - DIALYSIS  100 mcg Intravenous Q Tue-HD  . famotidine  20 mg Oral Daily  . feeding supplement (NEPRO CARB STEADY)  237 mL Oral BID BM  . feeding supplement (PRO-STAT SUGAR FREE 64)  30 mL Oral BID  . gabapentin  100 mg Oral BID  . guaiFENesin   600 mg Oral BID  . heparin  5,000 Units Subcutaneous Q12H  . latanoprost  1 drop Left Eye QHS  . midodrine  10 mg Oral BID WC  . multivitamin  1 tablet Oral QHS  . predniSONE  40 mg Oral Q breakfast  . tacrolimus  2 mg Oral QHS  . tacrolimus  3 mg Oral Daily  . Vitamin D (Ergocalciferol)  50,000 Units Oral Q Sun    have reviewed scheduled and prn medications.  Physical Exam: General:NAD, comfortable. Heart:RRR, s1s2 nl, no rubs Lungs: Clear bilateral, no wheezing Abdomen:soft, Non-tender, non-distended Extremities: Upper and lower extremities edema has improved Dialysis Access: Right femoral HD catheter  Carrie Schoonmaker Tanna Furry 08/10/2019,10:38 AM  LOS: 5 days  Pager: [phone]

## 2019-08-10 NOTE — Progress Notes (Signed)
PROGRESS NOTE    Tina Watkins  URK:[id number] DOB: 04-15-1965 DOA: 08/05/2019 PCP: [name], FNP   Brief Narrative: Patient is a 34-year female with history of ESRD status post renal transplant complicated by CKD stage IV on chronic steroids and immunosuppressive therapy, chronic anemia secondary to CKD, hypertension, TIA, chronic diastolic congestive heart failure, moderate pulmonary hypertension who presented with multiple joints pain which significantly limited her mobility. Symptoms started about a week ago. It involved right wrist and several fingers and right elbow and bilateral knees and ankles. She also had low-grade fever. Patient also developed productive cough with yellow sputum. She was started on hemodialysis this June because of failed kidney transplant.She has a right-sided femoral HD catheter for dialysis. On presentation, chest x-ray showed possible left lower lobe pneumonia. Admitted for further management.Startedon IV antibiotics and being also managed for acute gout flare.  Hospital course remained stable.  She was seen by PT and recommended home health on discharge.  Her pain has improved with pain medications and steroids.  Her respiratory status is stable and she is maintaining her saturation on room air.  She is hemodynamically stable for discharge to home with oral  pain medications but patient states she does not have anybody at home to support.  She states she still has difficulty on ambulating.  As per case manager, home health cannot be arranged due to her insurance. PT recommended SNF. Pending placement    Assessment & Plan:   Active Problems:   HCAP (healthcare-associated pneumonia)  Healthcare associated pneumonia: Presented with cough, new infiltrate on the chest x-ray.Leukocytosis.started on vancomycin and Zosyn.Elevated procalcitonin. Presented with fever. Since MRSA PCR is negative, vancomycin stopped. C Afebrile now.   Cultures have remained negative.  Her respiratory status is stable.  Currently on room air.  Antibiotics changed to oral.  CKD stage V on hemodialysis:Nephrology following. She has right-sided femoral dialysis catheter.  She has extremely limited IV access on the upper extremities. She is following with vascular surgery as an outpatient for permanent access. Patient still produces urine.  She was dialyzed during this hospitalization.  Acute gouty flareup: Presented with pain on the multiple joints. Atypical presentation. Colchicine restarted due to severe pain.  Dose of steroid has been increased from her baseline dose for gout. Complains of severe pain mainly on her left ankle.   Continue tapering dose of prednisone.  Follow-up with rheumatology as an outpatient for the management of gout.  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 Her nephrologist is still recommending to continue steroid and tacrolimus. Imuran was discontinued last month due to worsening pancytopenia.  Orthostatic hypotension:On midodrine. BP soft. She has chronic hypotension.  Leukocytosis:Leukocytosis most likely associated with her acute gout flareup. Stable  Anemia of chronic disease/thrombocytopenia: Associated with CKD. Currently hemoglobin stable. She has chronic macrocytosis. Normalvitamin B12 and folate.  Chronic diastolic heart failure: Volume managed by dialysis. Last echo on 12/20 showed restrictive cardiomyopathy, grade 3 diastolic dysfunction, preserved ejection fraction, moderate pulmonary hypertension  History of Covid infection:On April 2019. Presented with Covid pneumonia and had prolonged hospitalization.  Debility/deconditioning:PT recommended skilled nursing facility on discharge.    Nutrition Problem: Increased nutrient needs Etiology: chronic illness (ESRD on HD, CHF)      DVT prophylaxis: Heparin subcu Code Status: Full  code Family Communication: None present at the bedside Status is: Inpatient  Remains inpatient appropriate because:unsafe DC plan  Dispo: The patient is from: Home  Anticipated d/c is to: SNF              Anticipated d/c date is: As soon as bed is available              Patient currently is medically stable for discharge to home  Consultants: Nephrology  Procedures: Hemodialysis  Antimicrobials:  Anti-infectives (From admission, onward)   Start     Dose/Rate Route Frequency Ordered Stop   08/08/19 1200  vancomycin (VANCOCIN) IVPB 500 mg/100 ml premix  Status:  Discontinued        500 mg 100 mL/hr over 60 Minutes Intravenous Every T-Th-Sa (Hemodialysis) 08/05/19 1522 08/07/19 0748   08/08/19 1200  amoxicillin-clavulanate (AUGMENTIN) 500-125 MG per tablet 500 mg     Discontinue     1 tablet Oral 2 times daily 08/08/19 1101     08/08/19 0000  amoxicillin-clavulanate (AUGMENTIN) 500-125 MG tablet     Discontinue     1 tablet Oral 2 times daily 08/08/19 1233     08/05/19 2030  piperacillin-tazobactam (ZOSYN) IVPB 2.25 g  Status:  Discontinued        2.25 g 100 mL/hr over 30 Minutes Intravenous Every 8 hours 08/05/19 1522 08/08/19 1101   08/05/19 1230  vancomycin (VANCOCIN) IVPB 1000 mg/200 mL premix        1,000 mg 200 mL/hr over 60 Minutes Intravenous  Once 08/05/19 1219 08/05/19 1412   08/05/19 1230  piperacillin-tazobactam (ZOSYN) IVPB 3.375 g        3.375 g 100 mL/hr over 30 Minutes Intravenous  Once 08/05/19 1219 08/05/19 1328      Subjective: Patient seen and examined at the bedside this morning.  Hemodynamically stable.  Comfortable.  No changes from yesterday.  As usual, complains of pain.  Objective: Vitals:   08/09/19 1445 08/09/19 1822 08/09/19 2216 08/10/19 0800  BP: 110/64 112/61 103/62 (!) 107/58  Pulse: (!) 56 67 (!) 55   Resp: 18 18 16 18   Temp: 98 F (36.7 C) 98.3 F (36.8 C) 98.3 F (36.8 C) 98.4 F (36.9 C)  TempSrc: Oral Oral Oral Oral   SpO2: 97% 98% 95% 91%  Weight:      Height:       No intake or output data in the 24 hours ending 08/10/19 0835 Filed Weights   08/07/19 2205 08/08/19 1058 08/08/19 1430  Weight: 54.9 kg 54.4 kg 53.6 kg    Examination:  General exam: Appears calm and comfortable ,Not in distress,deconditioned ,debilitated Respiratory system: Bilateral equal air entry, normal vesicular breath sounds, no wheezes or crackles  Cardiovascular system: S1 & S2 heard, RRR. No JVD, murmurs, rubs, gallops or clicks.  Dialysis catheter on the right groin Gastrointestinal system: Abdomen is nondistended, soft and nontender. No organomegaly or masses felt. Normal bowel sounds heard. Central nervous system: Alert and oriented. No focal neurological deficits. Extremities: No edema, no clubbing ,no cyanosis, tender ankles Skin: No rashes, lesions or ulcers,no icterus ,no pallor  Data Reviewed: I have personally reviewed following labs and imaging studies  CBC: Recent Labs  Lab 08/05/19 1206 08/06/19 0815 08/07/19 0619 08/08/19 0357 08/09/19 0723  WBC 11.9* 14.4* 13.5* 11.1* 8.4  NEUTROABS 10.2*  --  12.3* 10.2* 6.8  HGB 9.0* 9.2* 9.5* 9.4* 8.9*  HCT 28.9* 29.3* 29.7* 30.3* 27.0*  MCV 109.5* 106.2* 107.6* 108.2* 102.3*  PLT 89* 71* 94* 102* 431*   Basic Metabolic Panel: Recent Labs  Lab 08/06/19 0815 08/07/19 0619 08/08/19 0357 08/09/19 0302 08/10/19  0644  NA 137 136 138 137 136  K 3.7 4.3 4.6 3.7 3.6  CL 100 101 99 100 98  CO2 23 23 27 24 25   GLUCOSE 84 132* 109* 92 100*  BUN 14 31* 26* 20 33*  CREATININE 2.41* 4.00* 3.38* 2.71* 4.30*  CALCIUM 8.2* 8.4* 8.7* 8.4* 8.9  PHOS  --  5.5* 5.1* 3.8 4.5   GFR: Estimated Creatinine Clearance: 11.6 mL/min (A) (by C-G formula based on SCr of 4.3 mg/dL (H)). Liver Function Tests: Recent Labs  Lab 08/05/19 1206 08/07/19 0619 08/08/19 0357 08/09/19 0302 08/10/19 0644  AST 25  --   --   --   --   ALT 25  --   --   --   --   ALKPHOS 117  --   --    --   --   BILITOT 2.5*  --   --   --   --   PROT 5.4*  --   --   --   --   ALBUMIN 3.1* 2.5* 2.7* 2.6* 2.8*   No results for input(s): LIPASE, AMYLASE in the last 168 hours. No results for input(s): AMMONIA in the last 168 hours. Coagulation Profile: No results for input(s): INR, PROTIME in the last 168 hours. Cardiac Enzymes: No results for input(s): CKTOTAL, CKMB, CKMBINDEX, TROPONINI in the last 168 hours. BNP (last 3 results) No results for input(s): PROBNP in the last 8760 hours. HbA1C: No results for input(s): HGBA1C in the last 72 hours. CBG: No results for input(s): GLUCAP in the last 168 hours. Lipid Profile: No results for input(s): CHOL, HDL, LDLCALC, TRIG, CHOLHDL, LDLDIRECT in the last 72 hours. Thyroid Function Tests: No results for input(s): TSH, T4TOTAL, FREET4, T3FREE, THYROIDAB in the last 72 hours. Anemia Panel: No results for input(s): VITAMINB12, FOLATE, FERRITIN, TIBC, IRON, RETICCTPCT in the last 72 hours. Sepsis Labs: Recent Labs  Lab 08/05/19 1206 08/06/19 0815 08/07/19 0619  PROCALCITON 2.81 5.49 11.50  LATICACIDVEN 1.3  --   --     Recent Results (from the past 240 hour(s))  Blood Culture (routine x 2)     Status: None (Preliminary result)   Collection Time: 08/05/19 12:05 PM   Specimen: BLOOD  Result Value Ref Range Status   Specimen Description BLOOD RIGHT ANTECUBITAL  Final   Special Requests   Final    BOTTLES DRAWN AEROBIC AND ANAEROBIC Blood Culture adequate volume   Culture   Final    NO GROWTH 4 DAYS Performed at [facility], [address], [place], [place] [address]    Report Status PENDING  Incomplete  Blood Culture (routine x 2)     Status: None (Preliminary result)   Collection Time: 08/05/19  2:31 PM   Specimen: BLOOD LEFT ARM  Result Value Ref Range Status   Specimen Description BLOOD LEFT ARM  Final   Special Requests   Final    BOTTLES DRAWN AEROBIC ONLY Blood Culture results may not be optimal due to an  inadequate volume of blood received in culture bottles   Culture   Final    NO GROWTH 4 DAYS Performed at [facility], [place] [address], [place], [place] [address]    Report Status PENDING  Incomplete  Urine culture     Status: None   Collection Time: 08/05/19  6:36 PM   Specimen: In/Out Cath Urine  Result Value Ref Range Status   Specimen Description IN/OUT CATH URINE  Final  Special Requests NONE  Final   Culture   Final    NO GROWTH Performed at [facility], [place] [address], [place], [place] [address]    Report Status 08/06/2019 FINAL  Final  SARS Coronavirus 2 by RT PCR (hospital order, performed in [facility] hospital lab) Nasopharyngeal Nasopharyngeal Swab     Status: None   Collection Time: 08/05/19  8:15 PM   Specimen: Nasopharyngeal Swab  Result Value Ref Range Status   SARS Coronavirus 2 NEGATIVE NEGATIVE Final    Comment: (NOTE) SARS-CoV-2 target nucleic acids are NOT DETECTED.  The SARS-CoV-2 RNA is generally detectable in upper and lower respiratory specimens during the acute phase of infection. The lowest concentration of SARS-CoV-2 viral copies this assay can detect is 250 copies / mL. A negative result does not preclude SARS-CoV-2 infection and should not be used as the sole basis for treatment or other patient management decisions.  A negative result may occur with improper specimen collection / handling, submission of specimen other than nasopharyngeal swab, presence of viral mutation(s) within the areas targeted by this assay, and inadequate number of viral copies (<250 copies / mL). A negative result must be combined with clinical observations, patient history, and epidemiological information.  Fact Sheet for Patients:   StrictlyIdeas.no  Fact Sheet for Healthcare Providers: BankingDealers.co.za  This test is not yet approved or  cleared by the [place] FDA and has been authorized for  detection and/or diagnosis of SARS-CoV-2 by FDA under an Emergency Use Authorization (EUA).  This EUA will remain in effect (meaning this test can be used) for the duration of the COVID-19 declaration under Section 564(b)(1) of the Act, 21 U.S.C. section 360bbb-3(b)(1), unless the authorization is terminated or revoked sooner.  Performed at [facility], [place] [address], [place], [place] [address]   MRSA PCR Screening     Status: None   Collection Time: 08/06/19 11:55 PM   Specimen: Nasal Mucosa; Nasopharyngeal  Result Value Ref Range Status   MRSA by PCR NEGATIVE NEGATIVE Final    Comment:        The GeneXpert MRSA Assay (FDA approved for NASAL specimens only), is one component of a comprehensive MRSA colonization surveillance program. It is not intended to diagnose MRSA infection nor to guide or monitor treatment for MRSA infections. Performed at [facility], [place] [address] [place], [place] [address]          Radiology Studies: No results found.      Scheduled Meds: . amoxicillin-clavulanate  1 tablet Oral BID  . aspirin EC  81 mg Oral Daily  . Chlorhexidine Gluconate Cloth  6 each Topical Q0600  . Chlorhexidine Gluconate Cloth  6 each Topical Q0600  . Chlorhexidine Gluconate Cloth  6 each Topical Q0600  . colchicine  0.3 mg Oral Daily  . darbepoetin (ARANESP) injection - DIALYSIS  100 mcg Intravenous Q Tue-HD  . famotidine  20 mg Oral Daily  . feeding supplement (NEPRO CARB STEADY)  237 mL Oral BID BM  . feeding supplement (PRO-STAT SUGAR FREE 64)  30 mL Oral BID  . gabapentin  100 mg Oral BID  . guaiFENesin  600 mg Oral BID  . heparin  5,000 Units Subcutaneous Q12H  . latanoprost  1 drop Left Eye QHS  . midodrine  10 mg Oral BID WC  . multivitamin  1 tablet Oral QHS  . predniSONE  40 mg Oral Q breakfast  . tacrolimus  2  mg Oral QHS  . tacrolimus  3 mg Oral Daily  . Vitamin D (Ergocalciferol)  50,000 Units Oral Q Sun   Continuous  Infusions:    LOS: 5 days    Time spent: 35 mins,More than 50% of that time was spent in counseling and/or coordination of care.      [name], MD [facility] P7/09/2019, 8:35 AM

## 2019-08-11 LAB — RENAL FUNCTION PANEL
Albumin: 2.8 g/dL — ABNORMAL LOW (ref 3.5–5.0)
Albumin: 2.8 g/dL — ABNORMAL LOW (ref 3.5–5.0)
Anion gap: 13 (ref 5–15)
Anion gap: 13 (ref 5–15)
BUN: 33 mg/dL — ABNORMAL HIGH (ref 6–20)
BUN: 22 mg/dL — ABNORMAL HIGH (ref 6–20)
CO2: 25 mmol/L (ref 22–32)
CO2: 24 mmol/L (ref 22–32)
Calcium: 8.9 mg/dL (ref 8.9–10.3)
Calcium: 8.7 mg/dL — ABNORMAL LOW (ref 8.9–10.3)
Chloride: 98 mmol/L (ref 98–111)
Chloride: 102 mmol/L (ref 98–111)
Creatinine, Ser: 4.3 mg/dL — ABNORMAL HIGH (ref 0.44–1.00)
Creatinine, Ser: 3.36 mg/dL — ABNORMAL HIGH (ref 0.44–1.00)
GFR calc Af Amer: 13 mL/min — ABNORMAL LOW (ref >60)
GFR calc Af Amer: 17 mL/min — ABNORMAL LOW (ref >60)
GFR calc non Af Amer: 11 mL/min — ABNORMAL LOW (ref >60)
GFR calc non Af Amer: 15 mL/min — ABNORMAL LOW (ref >60)
Glucose, Bld: 100 mg/dL — ABNORMAL HIGH (ref 70–99)
Glucose, Bld: 80 mg/dL (ref 70–99)
Phosphorus: 4.5 mg/dL (ref 2.5–4.6)
Phosphorus: 3.2 mg/dL (ref 2.5–4.6)
Potassium: 3.6 mmol/L (ref 3.5–5.1)
Potassium: 3.7 mmol/L (ref 3.5–5.1)
Sodium: 136 mmol/L (ref 135–145)
Sodium: 139 mmol/L (ref 135–145)

## 2019-08-11 MED ORDER — CHLORHEXIDINE GLUCONATE CLOTH 2 % EX PADS
6.0000 | MEDICATED_PAD | Freq: Every day | CUTANEOUS | Status: DC
  Administered 2019-08-13 – 2019-08-14 (×2): 6 via TOPICAL

## 2019-08-11 NOTE — Progress Notes (Signed)
Physical Therapy Treatment Patient Details Name: Tina Watkins MRN: [id number] DOB: [birth year] Today's Date: 08/11/2019    History of Present Illness Patient is a 32-year female with history of ESRD status post renal transplant complicated by CKD stage IV on chronic steroids and immunosuppressive therapy, chronic anemia secondary to CKD, hypertension, TIA, chronic diastolic congestive heart failure, moderate pulmonary hypertension who presented with multiple joints pain which significantly limited her mobility.  Recently re-started on HD due to kidney failure, now with R femoral HD cath.    PT Comments    Pt motivated to participate in PT today, requiring slightly less physical assist for bed mobility and LEs appear to be minimally less painful. Pt able to tolerate standing for increased time this day, standing for ~1 minute at a time but had extreme difficulty taking steps. Pt remains appropriate to d/c to SNF at this time, will continue to follow acutely.    Follow Up Recommendations  Supervision/Assistance - 24 hour;SNF     Equipment Recommendations  None recommended by PT (refusing w/c)    Recommendations for Other Services       Precautions / Restrictions Precautions Precautions: Fall Precaution Comments: R femoral HD cath (RN and pt report okay to sit) Restrictions Weight Bearing Restrictions: No    Mobility  Bed Mobility Overal bed mobility: Needs Assistance Bed Mobility: Sit to Supine;Supine to Sit     Supine to sit: HOB elevated;Min assist Sit to supine: Min assist;HOB elevated   General bed mobility comments: min assist for bilateral LE lifting and translation in and out of bed, verbal cuing for sequencing with very increased time and effort.  Transfers Overall transfer level: Needs assistance Equipment used: Rolling walker (2 wheeled) Transfers: Sit to/from Stand Sit to Stand: Mod assist;From elevated surface         General transfer comment: Mod  assist for power up, hip extension, and steadying. Stand x2, from EOB and BSC. Pt had BM on toilet requiring PT assist for pericare.  Ambulation/Gait     Assistive device: Rolling walker (2 wheeled) Gait Pattern/deviations: Step-to pattern;Antalgic;Trunk flexed Gait velocity: decr   General Gait Details: Pt able to take single step forward x2 during session with RLE leading, VERY painful and pt tearful. Pt with heavy trunk flexion and attempted to prop RUE on RW handle as opposed to holding on due to gout in thumb   Stairs             Wheelchair Mobility    Modified Rankin (Stroke Patients Only)       Balance Overall balance assessment: Needs assistance Sitting-balance support: Feet supported;Single extremity supported Sitting balance-Leahy Scale: Fair     Standing balance support: Bilateral upper extremity supported Standing balance-Leahy Scale: Poor                              Cognition Arousal/Alertness: Awake/alert Behavior During Therapy: WFL for tasks assessed/performed Overall Cognitive Status: Impaired/Different from baseline Area of Impairment: Attention;Following commands;Safety/judgement;Problem solving                   Current Attention Level: Selective   Following Commands: Follows one step commands consistently;Follows one step commands with increased time Safety/Judgement: Decreased awareness of safety;Decreased awareness of deficits Awareness: Intellectual Problem Solving: Slow processing;Difficulty sequencing;Requires verbal cues;Decreased initiation;Requires tactile cues General Comments: Pt continuing to lack insight into deficits, feels as if she can walk hallways and go home prior to  initiation of PT and had extreme difficulty taking single step. Requires repeated verbal and tactile cuing for safety during mobility.      Exercises General Exercises - Lower Extremity Ankle Circles/Pumps: AAROM;Both;Supine;10 reps     General Comments General comments (skin integrity, edema, etc.): skin tear on sacrum, painful for pt during pericare. wound covered with sacral pad, but areas of pad non-adhered due to frequent BMs      Pertinent Vitals/Pain Pain Assessment: Faces Faces Pain Scale: Hurts whole lot Pain Location: bilateral feet R>L, R knee 2* gout flare Pain Descriptors / Indicators: Aching;Sharp;Discomfort Pain Intervention(s): Limited activity within patient's tolerance;Monitored during session;Repositioned;Patient requesting pain meds-RN notified    Home Living                      Prior Function            PT Goals (current goals can now be found in the care plan section) Acute Rehab PT Goals Patient Stated Goal: go home PT Goal Formulation: With patient Time For Goal Achievement: 08/20/19 Potential to Achieve Goals: Fair Progress towards PT goals: Progressing toward goals    Frequency    Min 2X/week      PT Plan Current plan remains appropriate    Co-evaluation              AM-PAC PT "6 Clicks" Mobility   Outcome Measure  Help needed turning from your back to your side while in a flat bed without using bedrails?: A Lot Help needed moving from lying on your back to sitting on the side of a flat bed without using bedrails?: A Lot Help needed moving to and from a bed to a chair (including a wheelchair)?: A Lot Help needed standing up from a chair using your arms (e.g., wheelchair or bedside chair)?: A Lot Help needed to walk in hospital room?: Total Help needed climbing 3-5 steps with a railing? : Total 6 Click Score: 10    End of Session Equipment Utilized During Treatment: Gait belt Activity Tolerance: Patient limited by pain Patient left: in bed;with call bell/phone within reach;with bed alarm set Nurse Communication: Mobility status PT Visit Diagnosis: Unsteadiness on feet (R26.81);Pain;Muscle weakness (generalized) (M62.81) Pain - Right/Left: Right Pain -  part of body: Ankle and joints of foot     Time: 1430-1515 PT Time Calculation (min) (ACUTE ONLY): 45 min  Charges:  $Therapeutic Activity: 23-37 mins                     Nandan Willems E, PT Acute Rehabilitation Services Pager [phone]  Office [phone]   Biff Rutigliano D Mays Paino 08/11/2019, 4:27 PM

## 2019-08-11 NOTE — Progress Notes (Signed)
[facility] Progress Note  Subjective: pt seen in room, no new c/o  Vitals:   08/10/19 1630 08/10/19 1640 08/10/19 2142 08/11/19 0843  BP: (!) 101/54 110/60 (!) 94/59 (!) 110/59  Pulse: (!) 55 71 67 82  Resp:  18 18 18   Temp:  98.2 F (36.8 C) 98.1 F (36.7 C) 98.4 F (36.9 C)  TempSrc:  Oral Oral Oral  SpO2:  99% 99% 97%  Weight:  51.8 kg    Height:        Exam: General:NAD, comfortable. Heart:RRR, s1s2 nl, no rubs Lungs: Clear bilateral, no wheezing Abdomen:soft, Non-tender, non-distended Extremities: UE/ LE edema better, some R hip edema. R hand still swollen From gout but improved Dialysis Access: Right femoral HD catheter    OP HD: TTS [facility]    4h  57.5kg  2/2 bath  Hep 1000  R fem TDC  mircera 100 q2 last 6/24  Assessment/ Plan: 1. HCAP- suspected LLL PNA, treated with IV vancomycin and Zosyn.  Clinically improved.  Now antibiotics changed to Augmentin. 2. Acute gout flare: On colchicine and steroid. 3. ESRD / failed renal Tx: recently resumed HD in May 2021. TTS HD. Wt's down to 52kg which is 5-6 kg under.  4. Hypotension/Vol overload: Chronic hypotension therefore on midodrine. 5. 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 Be sure to taper down to 5 mg prednisone when making discharge orders. Imuran was stopped in June d/t leukopenia.  6. Anemia of ckd: Started Aranesp.  No iron because of infection. 7. MBD ckd: Calcium phosphorus okay.  Monitor lab. 8. H/o COVID infection - prior prolonged COVID admit in April. COVID negative on admission here.  9. Deconditioning - awaiting SNF placement      Rob Eudell Mcphee 08/11/2019, 9:22 AM   Recent Labs  Lab 08/08/19 0357 08/08/19 0357 08/09/19 0302 08/09/19 0723 08/10/19 0644  K 4.6   < > 3.7  --  3.6  BUN 26*   < > 20  --  33*  CREATININE 3.38*   < > 2.71*  --  4.30*  CALCIUM 8.7*   < > 8.4*  --  8.9  PHOS 5.1*   < > 3.8  --  4.5  HGB 9.4*  --   --  8.9*  --     < > = values in this interval not displayed.   Inpatient medications: . amoxicillin-clavulanate  1 tablet Oral BID  . aspirin EC  81 mg Oral Daily  . Chlorhexidine Gluconate Cloth  6 each Topical Q0600  . Chlorhexidine Gluconate Cloth  6 each Topical Q0600  . Chlorhexidine Gluconate Cloth  6 each Topical Q0600  . colchicine  0.3 mg Oral Daily  . darbepoetin (ARANESP) injection - DIALYSIS  100 mcg Intravenous Q Tue-HD  . famotidine  20 mg Oral Daily  . feeding supplement (NEPRO CARB STEADY)  237 mL Oral BID BM  . feeding supplement (PRO-STAT SUGAR FREE 64)  30 mL Oral BID  . gabapentin  100 mg Oral BID  . guaiFENesin  600 mg Oral BID  . heparin  5,000 Units Subcutaneous Q12H  . latanoprost  1 drop Left Eye QHS  . midodrine  10 mg Oral BID WC  . multivitamin  1 tablet Oral QHS  . predniSONE  20 mg Oral Q breakfast  . tacrolimus  2 mg Oral QHS  . tacrolimus  3 mg Oral Daily  . Vitamin D (Ergocalciferol)  50,000 Units Oral Q  Sun    albuterol, albuterol, diphenhydrAMINE, fluticasone, HYDROmorphone, lidocaine, nystatin, ondansetron, zolpidem

## 2019-08-11 NOTE — Progress Notes (Signed)
PROGRESS NOTE    Tina Watkins  DJM:[id number] DOB: Feb 28, 1965 DOA: 08/05/2019 PCP: [name], FNP   Brief Narrative: Patient is a 28-year female with history of ESRD status post renal transplant complicated by CKD stage IV on chronic steroids and immunosuppressive therapy, chronic anemia secondary to CKD, hypertension, TIA, chronic diastolic congestive heart failure, moderate pulmonary hypertension who presented with multiple joints pain which significantly limited her mobility. Symptoms started about a week ago. It involved right wrist and several fingers and right elbow and bilateral knees and ankles. She also had low-grade fever. Patient also developed productive cough with yellow sputum. She was started on hemodialysis this June because of failed kidney transplant.She has a right-sided femoral HD catheter for dialysis. On presentation, chest x-ray showed possible left lower lobe pneumonia. Admitted for further management.Startedon IV antibiotics and being also managed for acute gout flare.  Hospital course remained stable.  She was seen by PT and recommended home health on discharge.  She has chronic pain due to her gouty arthritis.  Her respiratory status is stable and she is maintaining her saturation on room air.  PT recommended SNF. Pending placement    Assessment & Plan:   Active Problems:   HCAP (healthcare-associated pneumonia)  Healthcare associated pneumonia: Presented with cough, new infiltrate on the chest x-ray.Leukocytosis.started on vancomycin and Zosyn.Elevated procalcitonin. Presented with fever. Since MRSA PCR is negative, vancomycin stopped.  Afebrile now.  Cultures have remained negative.  Her respiratory status is stable.  Currently on room air.  Antibiotics changed to oral.  CKD stage V on hemodialysis:Nephrology following. She has right-sided femoral dialysis catheter.  She has extremely limited IV access on the upper  extremities. She is following with vascular surgery as an outpatient for permanent access. Patient still produces urine.  She was dialyzed during this hospitalization.  Acute gouty flareup: Presented with pain on the multiple joints.  Colchicine restarted due to severe pain.  Dose of steroid has been increased from her baseline dose for gout. Complains of severe pain mainly on her left ankle.   Continue tapering dose of prednisone.  Follow-up with rheumatology as an outpatient for the management of gout.  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 Her nephrologist is still recommending to continue steroid and tacrolimus. Imuran was discontinued last month due to worsening pancytopenia.  Chronic  hypotension:On midodrine. BP soft. She has chronic hypotension.  Leukocytosis:Leukocytosis most likely associated with her acute gout flareup. resolved  Anemia of chronic disease/thrombocytopenia: Associated with CKD. Currently hemoglobin stable. She has chronic macrocytosis. Normalvitamin B12 and folate.  Chronic diastolic heart failure: Volume managed by dialysis. Last echo on 12/20 showed restrictive cardiomyopathy, grade 3 diastolic dysfunction, preserved ejection fraction, moderate pulmonary hypertension  History of Covid infection:On April 2019. Presented with Covid pneumonia and had prolonged hospitalization.  Debility/deconditioning: She is severely deconditioned due to her debilitating chronic gouty arthritis. PT recommended skilled nursing facility on discharge.  Continue supportive care.    Nutrition Problem: Increased nutrient needs Etiology: chronic illness (ESRD on HD, CHF)      DVT prophylaxis: Heparin subcu Code Status: Full code Family Communication: Discussed with patient  status is: Inpatient  Remains inpatient appropriate because:unsafe DC plan  Dispo: The patient is from: Home              Anticipated d/c is  to: SNF              Anticipated d/c date is: As soon as  bed is available              Patient currently is medically stable for discharge to home  Consultants: Nephrology  Procedures: Hemodialysis  Antimicrobials:  Anti-infectives (From admission, onward)   Start     Dose/Rate Route Frequency Ordered Stop   08/08/19 1200  vancomycin (VANCOCIN) IVPB 500 mg/100 ml premix  Status:  Discontinued        500 mg 100 mL/hr over 60 Minutes Intravenous Every T-Th-Sa (Hemodialysis) 08/05/19 1522 08/07/19 0748   08/08/19 1200  amoxicillin-clavulanate (AUGMENTIN) 500-125 MG per tablet 500 mg     Discontinue     1 tablet Oral 2 times daily 08/08/19 1101 08/12/19 0759   08/08/19 0000  amoxicillin-clavulanate (AUGMENTIN) 500-125 MG tablet     Discontinue     1 tablet Oral 2 times daily 08/08/19 1233     08/05/19 2030  piperacillin-tazobactam (ZOSYN) IVPB 2.25 g  Status:  Discontinued        2.25 g 100 mL/hr over 30 Minutes Intravenous Every 8 hours 08/05/19 1522 08/08/19 1101   08/05/19 1230  vancomycin (VANCOCIN) IVPB 1000 mg/200 mL premix        1,000 mg 200 mL/hr over 60 Minutes Intravenous  Once 08/05/19 1219 08/05/19 1412   08/05/19 1230  piperacillin-tazobactam (ZOSYN) IVPB 3.375 g        3.375 g 100 mL/hr over 30 Minutes Intravenous  Once 08/05/19 1219 08/05/19 1328      Subjective: Patient seen and examined at the bedside this morning.  Hemodynamically stable.  Was talking on phone.  As usual, complains of pain in her joints.  Became very emotional during my conversation given her chronic debility  Objective: Vitals:   08/10/19 1600 08/10/19 1630 08/10/19 1640 08/10/19 2142  BP: (!) 108/55 (!) 101/54 110/60 (!) 94/59  Pulse: (!) 58 (!) 55 71 67  Resp:   18 18  Temp:   98.2 F (36.8 C) 98.1 F (36.7 C)  TempSrc:   Oral Oral  SpO2:   99% 99%  Weight:   51.8 kg   Height:        Intake/Output Summary (Last 24 hours) at 08/11/2019 0839 Last data filed at 08/10/2019 2142 Gross per 24  hour  Intake 360 ml  Output 2050 ml  Net -1690 ml   Filed Weights   08/08/19 1430 08/10/19 1300 08/10/19 1640  Weight: 53.6 kg 54 kg 51.8 kg    Examination:  General exam: Appears calm and comfortable , deconditioned, debilitated 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 No JVD, murmurs, rubs, gallops or clicks. Gastrointestinal system: Abdomen is nondistended, soft and nontender. No organomegaly or masses felt. Normal bowel sounds heard. Central nervous system: Alert and oriented. No focal neurological deficits. Extremities: No edema, no clubbing ,no cyanosis, tenderness of the joints on the extremities.  Dialysis catheter on the right groin Skin: No rashes, lesions or ulcers,no icterus ,no pallor     Data Reviewed: I have personally reviewed following labs and imaging studies  CBC: Recent Labs  Lab 08/05/19 1206 08/06/19 0815 08/07/19 0619 08/08/19 0357 08/09/19 0723  WBC 11.9* 14.4* 13.5* 11.1* 8.4  NEUTROABS 10.2*  --  12.3* 10.2* 6.8  HGB 9.0* 9.2* 9.5* 9.4* 8.9*  HCT 28.9* 29.3* 29.7* 30.3* 27.0*  MCV 109.5* 106.2* 107.6* 108.2* 102.3*  PLT 89* 71* 94* 102* 103*  Basic Metabolic Panel: Recent Labs  Lab 08/06/19 0815 08/07/19 0619 08/08/19 0357 08/09/19 0302 08/10/19 0644  NA 137 136 138 137 136  K 3.7 4.3 4.6 3.7 3.6  CL 100 101 99 100 98  CO2 23 23 27 24 25   GLUCOSE 84 132* 109* 92 100*  BUN 14 31* 26* 20 33*  CREATININE 2.41* 4.00* 3.38* 2.71* 4.30*  CALCIUM 8.2* 8.4* 8.7* 8.4* 8.9  PHOS  --  5.5* 5.1* 3.8 4.5   GFR: Estimated Creatinine Clearance: 10.6 mL/min (A) (by C-G formula based on SCr of 4.3 mg/dL (H)). Liver Function Tests: Recent Labs  Lab 08/05/19 1206 08/07/19 0619 08/08/19 0357 08/09/19 0302 08/10/19 0644  AST 25  --   --   --   --   ALT 25  --   --   --   --   ALKPHOS 117  --   --   --   --     BILITOT 2.5*  --   --   --   --   PROT 5.4*  --   --   --   --   ALBUMIN 3.1* 2.5* 2.7* 2.6* 2.8*   No results for input(s): LIPASE, AMYLASE in the last 168 hours. No results for input(s): AMMONIA in the last 168 hours. Coagulation Profile: No results for input(s): INR, PROTIME in the last 168 hours. Cardiac Enzymes: No results for input(s): CKTOTAL, CKMB, CKMBINDEX, TROPONINI in the last 168 hours. BNP (last 3 results) No results for input(s): PROBNP in the last 8760 hours. HbA1C: No results for input(s): HGBA1C in the last 72 hours. CBG: No results for input(s): GLUCAP in the last 168 hours. Lipid Profile: No results for input(s): CHOL, HDL, LDLCALC, TRIG, CHOLHDL, LDLDIRECT in the last 72 hours. Thyroid Function Tests: No results for input(s): TSH, T4TOTAL, FREET4, T3FREE, THYROIDAB in the last 72 hours. Anemia Panel: No results for input(s): VITAMINB12, FOLATE, FERRITIN, TIBC, IRON, RETICCTPCT in the last 72 hours. Sepsis Labs: Recent Labs  Lab 08/05/19 1206 08/06/19 0815 08/07/19 0619  PROCALCITON 2.81 5.49 11.50  LATICACIDVEN 1.3  --   --     Recent Results (from the past 240 hour(s))  Blood Culture (routine x 2)     Status: None   Collection Time: 08/05/19 12:05 PM   Specimen: BLOOD  Result Value Ref Range Status   Specimen Description BLOOD RIGHT ANTECUBITAL  Final   Special Requests   Final    BOTTLES DRAWN AEROBIC AND ANAEROBIC Blood Culture adequate volume   Culture   Final    NO GROWTH 5 DAYS Performed at [facility], [address] [place], [place] [address]    Report Status 08/10/2019 FINAL  Final  Blood Culture (routine x 2)     Status: None   Collection Time: 08/05/19  2:31 PM   Specimen: BLOOD LEFT ARM  Result Value Ref Range Status   Specimen Description BLOOD LEFT ARM  Final   Special Requests   Final    BOTTLES DRAWN AEROBIC ONLY Blood Culture results may not be optimal due to an inadequate volume of blood received in culture bottles    Culture   Final    NO GROWTH 5 DAYS Performed at [facility], [place] [address] [place],  [address]    Report Status 08/10/2019 FINAL  Final  Urine culture     Status: None   Collection Time: 08/05/19  6:36 PM   Specimen: In/Out Cath Urine  Result Value Ref Range Status   Specimen Description IN/OUT CATH URINE  Final   Special Requests NONE  Final   Culture   Final    NO GROWTH Performed at [facility], [address] [place], [place] [address]    Report Status 08/06/2019 FINAL  Final  SARS Coronavirus 2 by RT PCR (hospital order, performed in [facility] hospital lab) Nasopharyngeal Nasopharyngeal Swab     Status: None   Collection Time: 08/05/19  8:15 PM   Specimen: Nasopharyngeal Swab  Result Value Ref Range Status   SARS Coronavirus 2 NEGATIVE NEGATIVE Final    Comment: (NOTE) SARS-CoV-2 target nucleic acids are NOT DETECTED.  The SARS-CoV-2 RNA is generally detectable in upper and lower respiratory specimens during the acute phase of infection. The lowest concentration of SARS-CoV-2 viral copies this assay can detect is 250 copies / mL. A negative result does not preclude SARS-CoV-2 infection and should not be used as the sole basis for treatment or other patient management decisions.  A negative result may occur with improper specimen collection / handling, submission of specimen other than nasopharyngeal swab, presence of viral mutation(s) within the areas targeted by this assay, and inadequate number of viral copies (<250 copies / mL). A negative result must be combined with clinical observations, patient history, and epidemiological information.  Fact Sheet for Patients:   StrictlyIdeas.no  Fact Sheet for Healthcare Providers: BankingDealers.co.za  This test is not yet approved or  cleared by the [place] FDA and has been authorized for detection and/or diagnosis of SARS-CoV-2 by FDA under an  Emergency Use Authorization (EUA).  This EUA will remain in effect (meaning this test can be used) for the duration of the COVID-19 declaration under Section 564(b)(1) of the Act, 21 U.S.C. section 360bbb-3(b)(1), unless the authorization is terminated or revoked sooner.  Performed at [facility], [place] [address], [place], [place] [address]   MRSA PCR Screening     Status: None   Collection Time: 08/06/19 11:55 PM   Specimen: Nasal Mucosa; Nasopharyngeal  Result Value Ref Range Status   MRSA by PCR NEGATIVE NEGATIVE Final    Comment:        The GeneXpert MRSA Assay (FDA approved for NASAL specimens only), is one component of a comprehensive MRSA colonization surveillance program. It is not intended to diagnose MRSA infection nor to guide or monitor treatment for MRSA infections. Performed at [facility], [place] [address], [place], [place] [address]          Radiology Studies: No results found.      Scheduled Meds: . amoxicillin-clavulanate  1 tablet Oral BID  . aspirin EC  81 mg Oral Daily  . Chlorhexidine Gluconate Cloth  6 each Topical Q0600  . Chlorhexidine Gluconate Cloth  6 each Topical Q0600  . Chlorhexidine Gluconate Cloth  6 each Topical Q0600  . colchicine  0.3 mg Oral Daily  . darbepoetin (ARANESP) injection - DIALYSIS  100 mcg Intravenous Q Tue-HD  . famotidine  20 mg Oral Daily  . feeding supplement (NEPRO CARB STEADY)  237 mL Oral BID BM  . feeding supplement (PRO-STAT SUGAR FREE 64)  30 mL Oral BID  . gabapentin  100 mg Oral BID  . guaiFENesin  600 mg Oral BID  . heparin  5,000 Units Subcutaneous Q12H  . latanoprost  1 drop Left Eye QHS  . midodrine  10 mg Oral BID WC  . multivitamin  1 tablet  Oral QHS  . predniSONE  20 mg Oral Q breakfast  . tacrolimus  2 mg Oral QHS  . tacrolimus  3 mg Oral Daily  . Vitamin D (Ergocalciferol)  50,000 Units Oral Q Sun   Continuous Infusions:    LOS: 6 days    Time spent: 35 mins,More than  50% of that time was spent in counseling and/or coordination of care.      [name], MD [facility] P7/10/2019, 8:39 AM

## 2019-08-12 DIAGNOSIS — M10041 Idiopathic gout, right hand: Secondary | ICD-10-CM

## 2019-08-12 LAB — RENAL FUNCTION PANEL
Albumin: 2.6 g/dL — ABNORMAL LOW (ref 3.5–5.0)
Anion gap: 12 (ref 5–15)
BUN: 36 mg/dL — ABNORMAL HIGH (ref 6–20)
CO2: 25 mmol/L (ref 22–32)
Calcium: 8.8 mg/dL — ABNORMAL LOW (ref 8.9–10.3)
Chloride: 101 mmol/L (ref 98–111)
Creatinine, Ser: 4.53 mg/dL — ABNORMAL HIGH (ref 0.44–1.00)
GFR calc Af Amer: 12 mL/min — ABNORMAL LOW (ref >60)
GFR calc non Af Amer: 10 mL/min — ABNORMAL LOW (ref >60)
Glucose, Bld: 104 mg/dL — ABNORMAL HIGH (ref 70–99)
Phosphorus: 3.1 mg/dL (ref 2.5–4.6)
Potassium: 3.8 mmol/L (ref 3.5–5.1)
Sodium: 138 mmol/L (ref 135–145)

## 2019-08-12 LAB — CBC
HCT: 23.6 % — ABNORMAL LOW (ref 36.0–46.0)
Hemoglobin: 7.4 g/dL — ABNORMAL LOW (ref 12.0–15.0)
MCH: 33 pg (ref 26.0–34.0)
MCHC: 31.4 g/dL (ref 30.0–36.0)
MCV: 105.4 fL — ABNORMAL HIGH (ref 80.0–100.0)
Platelets: 121 10*3/uL — ABNORMAL LOW (ref 150–400)
RBC: 2.24 MIL/uL — ABNORMAL LOW (ref 3.87–5.11)
RDW: 22.9 % — ABNORMAL HIGH (ref 11.5–15.5)
WBC: 8.5 10*3/uL (ref 4.0–10.5)
nRBC: 1.7 % — ABNORMAL HIGH (ref 0.0–0.2)

## 2019-08-12 MED ORDER — HEPARIN SODIUM (PORCINE) 1000 UNIT/ML IJ SOLN
INTRAMUSCULAR | Status: AC
  Administered 2019-08-12: 1000 [IU]
  Filled 2019-08-12: qty 6

## 2019-08-12 MED ORDER — HEPARIN SODIUM (PORCINE) 1000 UNIT/ML IJ SOLN
INTRAMUSCULAR | Status: AC
  Administered 2019-08-12: 1000 [IU]
  Filled 2019-08-12: qty 1

## 2019-08-12 MED ORDER — PREDNISONE 20 MG PO TABS
20.0000 mg | ORAL_TABLET | Freq: Two times a day (BID) | ORAL | Status: DC
  Administered 2019-08-12 – 2019-08-16 (×8): 20 mg via ORAL
  Filled 2019-08-12 (×8): qty 1

## 2019-08-12 NOTE — Plan of Care (Signed)

## 2019-08-12 NOTE — Progress Notes (Signed)
PROGRESS NOTE    Tina Watkins  ZSW:[id number] DOB: Apr 12, 1965 DOA: 08/05/2019 PCP: [name], FNP   Brief Narrative: Patient is a 60-year female with history of ESRD status post renal transplant complicated by CKD stage IV on chronic steroids and immunosuppressive therapy, chronic anemia secondary to CKD, hypertension, TIA, chronic diastolic congestive heart failure, moderate pulmonary hypertension who presented with multiple joints pain which significantly limited her mobility. Symptoms started about a week ago. It involved right wrist and several fingers and right elbow and bilateral knees and ankles. She also had low-grade fever. Patient also developed productive cough with yellow sputum. She was started on hemodialysis this June because of failed kidney transplant.She has a right-sided femoral HD catheter for dialysis. On presentation, chest x-ray showed possible left lower lobe pneumonia. Admitted for further management.Startedon IV antibiotics and being also managed for acute gout flare.  Hospital course remained stable.  She was seen by PT and recommended home health on discharge.  She has chronic pain due to her gouty arthritis.  Her respiratory status is stable and she is maintaining her saturation on room air.  PT recommended SNF. Pending placement    Assessment & Plan:   Active Problems:   HCAP (healthcare-associated pneumonia)  Healthcare associated pneumonia: Presented with cough, new infiltrate on the chest x-ray.Leukocytosis.started on vancomycin and Zosyn.Elevated procalcitonin. Presented with fever. Since MRSA PCR is negative, vancomycin stopped.  Afebrile now.  Cultures have remained negative.  Her respiratory status is stable.  Currently on room air.  Antibiotics changed to oral Augmentin which she completed course on 08/11/2019.  ESRD on hemodialysis/failed renal transplant x3:Nephrology following. She has right-sided femoral dialysis  catheter. She has extremely limited IV access on the upper extremities. She is following with vascular surgery as an outpatient for permanent access. Patient still produces urine.  She was dialyzed during this hospitalization.  Acute gouty flareup: Presented with pain on the multiple joints, more pronounced in bilateral ankle and right thumb.  Colchicine restarted due to severe pain. Dose of steroid has been increased from her baseline of 5 mg p.o. daily to 20 mg p.o. daily but she still complains of severe pain.  Tells me that her right thumb is improving and has improved swelling but it still is significantly tender to touch and swollen.  She is on substandard dose of prednisone.  I will increase this to 20 mg p.o. twice daily.  Continue colchicine.  She will need slow taper.  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 Her nephrologist is still recommending to continue steroid and tacrolimus. Imuran was discontinued last month due to worsening pancytopenia.  Chronic  hypotension:On midodrine. BP soft. She has chronic hypotension.  Leukocytosis:Leukocytosis most likely associated with her acute gout flareup. resolved  Anemia of chronic disease/thrombocytopenia: Associated with CKD. Currently hemoglobin down to 7.4. She has chronic macrocytosis. Normalvitamin B12 and folate.  We will repeat CBC tomorrow.  Will need transfusion if drops less than 7.  Chronic diastolic heart failure: Volume managed by dialysis. Last echo on 12/20 showed restrictive cardiomyopathy, grade 3 diastolic dysfunction, preserved ejection fraction, moderate pulmonary hypertension  History of Covid infection:On April 2019. Presented with Covid pneumonia and had prolonged hospitalization.  Debility/deconditioning: She is severely deconditioned due to her debilitating chronic gouty arthritis. PT recommended skilled nursing facility on discharge.  Continue supportive care.   TOC on board.   Nutrition Problem: Increased nutrient needs Etiology: chronic illness (ESRD on HD, CHF)  DVT prophylaxis: Heparin subcu Code Status: Full code Family Communication: Discussed with patient  status is: Inpatient  Remains inpatient appropriate because:unsafe DC plan  Dispo: The patient is from: Home              Anticipated d/c is to: SNF              Anticipated d/c date is: As soon as bed is available              Patient currently is medically stable for discharge to home  Consultants: Nephrology  Procedures: Hemodialysis  Antimicrobials:  Anti-infectives (From admission, onward)   Start     Dose/Rate Route Frequency Ordered Stop   08/08/19 1200  vancomycin (VANCOCIN) IVPB 500 mg/100 ml premix  Status:  Discontinued        500 mg 100 mL/hr over 60 Minutes Intravenous Every T-Th-Sa (Hemodialysis) 08/05/19 1522 08/07/19 0748   08/08/19 1200  amoxicillin-clavulanate (AUGMENTIN) 500-125 MG per tablet 500 mg        1 tablet Oral 2 times daily 08/08/19 1101 08/11/19 1900   08/08/19 0000  amoxicillin-clavulanate (AUGMENTIN) 500-125 MG tablet  Status:  Discontinued        1 tablet Oral 2 times daily 08/08/19 1233 08/11/19    08/05/19 2030  piperacillin-tazobactam (ZOSYN) IVPB 2.25 g  Status:  Discontinued        2.25 g 100 mL/hr over 30 Minutes Intravenous Every 8 hours 08/05/19 1522 08/08/19 1101   08/05/19 1230  vancomycin (VANCOCIN) IVPB 1000 mg/200 mL premix        1,000 mg 200 mL/hr over 60 Minutes Intravenous  Once 08/05/19 1219 08/05/19 1412   08/05/19 1230  piperacillin-tazobactam (ZOSYN) IVPB 3.375 g        3.375 g 100 mL/hr over 30 Minutes Intravenous  Once 08/05/19 1219 08/05/19 1328      Subjective: Patient seen and examined.  She complains of pain in bilateral ankle and right thumb.  No other complaint.  Objective: Vitals:   08/12/19 1300 08/12/19 1330 08/12/19 1400 08/12/19 1430  BP: (!) 94/55 (!) 76/48 (!) 74/48 (!) 72/44  Pulse: 90 91  89 86  Resp:      Temp:      TempSrc:      SpO2:      Weight:      Height:       No intake or output data in the 24 hours ending 08/12/19 1516 Filed Weights   08/10/19 1300 08/10/19 1640 08/12/19 1242  Weight: 54 kg 51.8 kg 52.6 kg    Examination:  General exam: Appears calm and comfortable, obese Respiratory system: Clear to auscultation. Respiratory effort normal. Cardiovascular system: S1 & S2 heard, RRR. No JVD, murmurs, rubs, gallops or clicks. No pedal edema. Gastrointestinal system: Abdomen is nondistended, soft and nontender. No organomegaly or masses felt. Normal bowel sounds heard. Central nervous system: Alert and oriented. No focal neurological deficits. Extremities: Symmetric 5 x 5 power.  Edematous and tender right thumb. Skin: No rashes, lesions or ulcers.  Psychiatry: Judgement and insight appear normal. Mood & affect appropriate.   Data Reviewed: I have personally reviewed following labs and imaging studies  CBC: Recent Labs  Lab 08/06/19 0815 08/07/19 0619 08/08/19 0357 08/09/19 0723 08/12/19 1302  WBC 14.4* 13.5* 11.1* 8.4 8.5  NEUTROABS  --  12.3* 10.2* 6.8  --   HGB 9.2* 9.5* 9.4* 8.9* 7.4*  HCT 29.3* 29.7* 30.3* 27.0* 23.6*  MCV 106.2* 107.6* 108.2*  102.3* 105.4*  PLT 71* 94* 102* 103* 710*   Basic Metabolic Panel: Recent Labs  Lab 08/08/19 0357 08/09/19 0302 08/10/19 0644 08/11/19 0805 08/12/19 0359  NA 138 137 136 139 138  K 4.6 3.7 3.6 3.7 3.8  CL 99 100 98 102 101  CO2 27 24 25 24 25   GLUCOSE 109* 92 100* 80 104*  BUN 26* 20 33* 22* 36*  CREATININE 3.38* 2.71* 4.30* 3.36* 4.53*  CALCIUM 8.7* 8.4* 8.9 8.7* 8.8*  PHOS 5.1* 3.8 4.5 3.2 3.1   GFR: Estimated Creatinine Clearance: 10.9 mL/min (A) (by C-G formula based on SCr of 4.53 mg/dL (H)). Liver Function Tests: Recent Labs  Lab 08/08/19 0357 08/09/19 0302 08/10/19 0644 08/11/19 0805 08/12/19 0359  ALBUMIN 2.7* 2.6* 2.8* 2.8* 2.6*   No results for input(s): LIPASE,  AMYLASE in the last 168 hours. No results for input(s): AMMONIA in the last 168 hours. Coagulation Profile: No results for input(s): INR, PROTIME in the last 168 hours. Cardiac Enzymes: No results for input(s): CKTOTAL, CKMB, CKMBINDEX, TROPONINI in the last 168 hours. BNP (last 3 results) No results for input(s): PROBNP in the last 8760 hours. HbA1C: No results for input(s): HGBA1C in the last 72 hours. CBG: No results for input(s): GLUCAP in the last 168 hours. Lipid Profile: No results for input(s): CHOL, HDL, LDLCALC, TRIG, CHOLHDL, LDLDIRECT in the last 72 hours. Thyroid Function Tests: No results for input(s): TSH, T4TOTAL, FREET4, T3FREE, THYROIDAB in the last 72 hours. Anemia Panel: No results for input(s): VITAMINB12, FOLATE, FERRITIN, TIBC, IRON, RETICCTPCT in the last 72 hours. Sepsis Labs: Recent Labs  Lab 08/06/19 0815 08/07/19 0619  PROCALCITON 5.49 11.50    Recent Results (from the past 240 hour(s))  Blood Culture (routine x 2)     Status: None   Collection Time: 08/05/19 12:05 PM   Specimen: BLOOD  Result Value Ref Range Status   Specimen Description BLOOD RIGHT ANTECUBITAL  Final   Special Requests   Final    BOTTLES DRAWN AEROBIC AND ANAEROBIC Blood Culture adequate volume   Culture   Final    NO GROWTH 5 DAYS Performed at [facility], [address], [place], [place] [address]    Report Status 08/10/2019 FINAL  Final  Blood Culture (routine x 2)     Status: None   Collection Time: 08/05/19  2:31 PM   Specimen: BLOOD LEFT ARM  Result Value Ref Range Status   Specimen Description BLOOD LEFT ARM  Final   Special Requests   Final    BOTTLES DRAWN AEROBIC ONLY Blood Culture results may not be optimal due to an inadequate volume of blood received in culture bottles   Culture   Final    NO GROWTH 5 DAYS Performed at [facility], [place] [address] [place], [place] [address]    Report Status 08/10/2019 FINAL  Final  Urine culture     Status:  None   Collection Time: 08/05/19  6:36 PM   Specimen: In/Out Cath Urine  Result Value Ref Range Status   Specimen Description IN/OUT CATH URINE  Final   Special Requests NONE  Final   Culture   Final    NO GROWTH Performed at [facility], [place] [address], [place], [place] [address]    Report Status 08/06/2019 FINAL  Final  SARS Coronavirus 2 by RT PCR (hospital order, performed in [facility] hospital lab) Nasopharyngeal Nasopharyngeal Swab     Status: None  Collection Time: 08/05/19  8:15 PM   Specimen: Nasopharyngeal Swab  Result Value Ref Range Status   SARS Coronavirus 2 NEGATIVE NEGATIVE Final    Comment: (NOTE) SARS-CoV-2 target nucleic acids are NOT DETECTED.  The SARS-CoV-2 RNA is generally detectable in upper and lower respiratory specimens during the acute phase of infection. The lowest concentration of SARS-CoV-2 viral copies this assay can detect is 250 copies / mL. A negative result does not preclude SARS-CoV-2 infection and should not be used as the sole basis for treatment or other patient management decisions.  A negative result may occur with improper specimen collection / handling, submission of specimen other than nasopharyngeal swab, presence of viral mutation(s) within the areas targeted by this assay, and inadequate number of viral copies (<250 copies / mL). A negative result must be combined with clinical observations, patient history, and epidemiological information.  Fact Sheet for Patients:   StrictlyIdeas.no  Fact Sheet for Healthcare Providers: BankingDealers.co.za  This test is not yet approved or  cleared by the [place] FDA and has been authorized for detection and/or diagnosis of SARS-CoV-2 by FDA under an Emergency Use Authorization (EUA).  This EUA will remain in effect (meaning this test can be used) for the duration of the COVID-19 declaration under Section 564(b)(1) of the Act, 21  U.S.C. section 360bbb-3(b)(1), unless the authorization is terminated or revoked sooner.  Performed at [facility], [place] [address] [place], [place] [address]   MRSA PCR Screening     Status: None   Collection Time: 08/06/19 11:55 PM   Specimen: Nasal Mucosa; Nasopharyngeal  Result Value Ref Range Status   MRSA by PCR NEGATIVE NEGATIVE Final    Comment:        The GeneXpert MRSA Assay (FDA approved for NASAL specimens only), is one component of a comprehensive MRSA colonization surveillance program. It is not intended to diagnose MRSA infection nor to guide or monitor treatment for MRSA infections. Performed at [facility], [place] [address] [place], [place] [address]          Radiology Studies: No results found.      Scheduled Meds: . aspirin EC  81 mg Oral Daily  . Chlorhexidine Gluconate Cloth  6 each Topical Q0600  . Chlorhexidine Gluconate Cloth  6 each Topical Q0600  . Chlorhexidine Gluconate Cloth  6 each Topical Q0600  . Chlorhexidine Gluconate Cloth  6 each Topical Q0600  . colchicine  0.3 mg Oral Daily  . darbepoetin (ARANESP) injection - DIALYSIS  100 mcg Intravenous Q Tue-HD  . famotidine  20 mg Oral Daily  . feeding supplement (NEPRO CARB STEADY)  237 mL Oral BID BM  . feeding supplement (PRO-STAT SUGAR FREE 64)  30 mL Oral BID  . gabapentin  100 mg Oral BID  . guaiFENesin  600 mg Oral BID  . heparin  5,000 Units Subcutaneous Q12H  . heparin sodium (porcine)      . latanoprost  1 drop Left Eye QHS  . midodrine  10 mg Oral BID WC  . multivitamin  1 tablet Oral QHS  . predniSONE  20 mg Oral Q breakfast  . tacrolimus  2 mg Oral QHS  . tacrolimus  3 mg Oral Daily  . Vitamin D (Ergocalciferol)  50,000 Units Oral Q Sun   Continuous Infusions:    LOS: 7 days   Time spent: 34 mins  Darliss Cheney, MD [facility] P7/11/2019, 3:16 PM

## 2019-08-12 NOTE — Progress Notes (Signed)
Noted yellow [place],patient in HD

## 2019-08-13 LAB — RENAL FUNCTION PANEL
Albumin: 2.7 g/dL — ABNORMAL LOW (ref 3.5–5.0)
Anion gap: 11 (ref 5–15)
BUN: 40 mg/dL — ABNORMAL HIGH (ref 6–20)
CO2: 22 mmol/L (ref 22–32)
Calcium: 8.4 mg/dL — ABNORMAL LOW (ref 8.9–10.3)
Chloride: 105 mmol/L (ref 98–111)
Creatinine, Ser: 3.16 mg/dL — ABNORMAL HIGH (ref 0.44–1.00)
GFR calc Af Amer: 19 mL/min — ABNORMAL LOW (ref >60)
GFR calc non Af Amer: 16 mL/min — ABNORMAL LOW (ref >60)
Glucose, Bld: 107 mg/dL — ABNORMAL HIGH (ref 70–99)
Phosphorus: 2.9 mg/dL (ref 2.5–4.6)
Potassium: 5 mmol/L (ref 3.5–5.1)
Sodium: 138 mmol/L (ref 135–145)

## 2019-08-13 LAB — CBC WITH DIFFERENTIAL/PLATELET
Abs Immature Granulocytes: 0.59 10*3/uL — ABNORMAL HIGH (ref 0.00–0.07)
Basophils Absolute: 0 10*3/uL (ref 0.0–0.1)
Basophils Relative: 0 %
Eosinophils Absolute: 0 10*3/uL (ref 0.0–0.5)
Eosinophils Relative: 0 %
HCT: 25.6 % — ABNORMAL LOW (ref 36.0–46.0)
Hemoglobin: 8 g/dL — ABNORMAL LOW (ref 12.0–15.0)
Immature Granulocytes: 6 %
Lymphocytes Relative: 10 %
Lymphs Abs: 1 10*3/uL (ref 0.7–4.0)
MCH: 33.5 pg (ref 26.0–34.0)
MCHC: 31.3 g/dL (ref 30.0–36.0)
MCV: 107.1 fL — ABNORMAL HIGH (ref 80.0–100.0)
Monocytes Absolute: 0.7 10*3/uL (ref 0.1–1.0)
Monocytes Relative: 7 %
Neutro Abs: 7.2 10*3/uL (ref 1.7–7.7)
Neutrophils Relative %: 77 %
Platelets: 131 10*3/uL — ABNORMAL LOW (ref 150–400)
RBC: 2.39 MIL/uL — ABNORMAL LOW (ref 3.87–5.11)
RDW: 23.6 % — ABNORMAL HIGH (ref 11.5–15.5)
WBC: 9.5 10*3/uL (ref 4.0–10.5)
nRBC: 1.5 % — ABNORMAL HIGH (ref 0.0–0.2)

## 2019-08-13 MED ORDER — TACROLIMUS 1 MG PO CAPS
3.0000 mg | ORAL_CAPSULE | Freq: Every morning | ORAL | Status: DC
  Administered 2019-08-14 – 2019-08-24 (×10): 3 mg via ORAL
  Filled 2019-08-13 (×11): qty 3

## 2019-08-13 MED ORDER — ALLOPURINOL 100 MG PO TABS
100.0000 mg | ORAL_TABLET | Freq: Every day | ORAL | Status: DC
  Administered 2019-08-13 – 2019-08-24 (×12): 100 mg via ORAL
  Filled 2019-08-13 (×12): qty 1

## 2019-08-13 NOTE — Progress Notes (Signed)
PROGRESS NOTE    Tina Watkins  QBH:[id number] DOB: 1965/05/12 DOA: 08/05/2019 PCP: [name], FNP   Brief Narrative: Patient is a 52-year female with history of ESRD status post renal transplant complicated by CKD stage IV on chronic steroids and immunosuppressive therapy, chronic anemia secondary to CKD, hypertension, TIA, chronic diastolic congestive heart failure, moderate pulmonary hypertension who presented with multiple joints pain which significantly limited her mobility. Symptoms started about a week ago. It involved right wrist and several fingers and right elbow and bilateral knees and ankles. She also had low-grade fever. Patient also developed productive cough with yellow sputum. She was started on hemodialysis this June because of failed kidney transplant.She has a right-sided femoral HD catheter for dialysis. On presentation, chest x-ray showed possible left lower lobe pneumonia. Admitted for further management.Startedon IV antibiotics and being also managed for acute gout flare.  Hospital course remained stable.  She was seen by PT and recommended home health on discharge.  She has chronic pain due to her gouty arthritis.  Her respiratory status is stable and she is maintaining her saturation on room air.  PT recommended SNF. Pending placement  Assessment & Plan:   Active Problems:   HCAP (healthcare-associated pneumonia)  Healthcare associated pneumonia: Presented with cough, new infiltrate on the chest x-ray.Leukocytosis.started on vancomycin and Zosyn.Elevated procalcitonin. Presented with fever. Since MRSA PCR is negative, vancomycin stopped.  Afebrile now.  Cultures have remained negative.  Her respiratory status is stable.  Currently on room air.  Antibiotics changed to oral Augmentin which she completed course on 08/11/2019.  ESRD on hemodialysis/failed renal transplant x3:Nephrology following. She has right-sided femoral dialysis  catheter. She has extremely limited IV access on the upper extremities. She is following with vascular surgery as an outpatient for permanent access. Patient still produces urine.  She was dialyzed during this hospitalization.  Acute gouty flareup: Presented with pain on the multiple joints, more pronounced in bilateral ankle and right thumb.  Colchicine restarted due to severe pain. Dose of steroid has been increased from her baseline of 5 mg p.o. daily to 20 mg p.o. daily which was increased to twice daily on 08/12/2019 and patient feels much better. Her ankle pain and right thumb pain is improved. Her right thumb is much less swollen compared to yesterday and it is very minimally tender as opposed to excruciating tender yesterday. Continue current dose of steroids for now.  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 Her nephrologist is still recommending to continue steroid and tacrolimus. Imuran was discontinued last month due to worsening pancytopenia.  Chronic  hypotension:On midodrine. BP soft. She has chronic hypotension.  Leukocytosis:Leukocytosis most likely associated with her acute gout flareup. resolved  Anemia of chronic disease/thrombocytopenia: Associated with CKD. Currently hemoglobin down to 7.4. She has chronic macrocytosis. Normalvitamin B12 and folate.  We will repeat CBC tomorrow.  Will need transfusion if drops less than 7.  Chronic diastolic heart failure: Volume managed by dialysis. Last echo on 12/20 showed restrictive cardiomyopathy, grade 3 diastolic dysfunction, preserved ejection fraction, moderate pulmonary hypertension  History of Covid infection:On April 2019. Presented with Covid pneumonia and had prolonged hospitalization.  Debility/deconditioning: She is severely deconditioned due to her debilitating chronic gouty arthritis. PT recommended skilled nursing facility on discharge.  Continue supportive care.  TOC  on board.   Nutrition Problem: Increased nutrient needs Etiology: chronic illness (ESRD on HD, CHF)      DVT prophylaxis: Heparin subcu Code Status: Full  code Family Communication: Discussed with patient  status is: Inpatient  Remains inpatient appropriate because:unsafe DC plan  Dispo: The patient is from: Home              Anticipated d/c is to: SNF              Anticipated d/c date is: As soon as bed is available              Patient currently is medically stable for discharge to home  Consultants: Nephrology  Procedures: Hemodialysis  Antimicrobials:  Anti-infectives (From admission, onward)   Start     Dose/Rate Route Frequency Ordered Stop   08/08/19 1200  vancomycin (VANCOCIN) IVPB 500 mg/100 ml premix  Status:  Discontinued        500 mg 100 mL/hr over 60 Minutes Intravenous Every T-Th-Sa (Hemodialysis) 08/05/19 1522 08/07/19 0748   08/08/19 1200  amoxicillin-clavulanate (AUGMENTIN) 500-125 MG per tablet 500 mg        1 tablet Oral 2 times daily 08/08/19 1101 08/11/19 1900   08/08/19 0000  amoxicillin-clavulanate (AUGMENTIN) 500-125 MG tablet  Status:  Discontinued        1 tablet Oral 2 times daily 08/08/19 1233 08/11/19    08/05/19 2030  piperacillin-tazobactam (ZOSYN) IVPB 2.25 g  Status:  Discontinued        2.25 g 100 mL/hr over 30 Minutes Intravenous Every 8 hours 08/05/19 1522 08/08/19 1101   08/05/19 1230  vancomycin (VANCOCIN) IVPB 1000 mg/200 mL premix        1,000 mg 200 mL/hr over 60 Minutes Intravenous  Once 08/05/19 1219 08/05/19 1412   08/05/19 1230  piperacillin-tazobactam (ZOSYN) IVPB 3.375 g        3.375 g 100 mL/hr over 30 Minutes Intravenous  Once 08/05/19 1219 08/05/19 1328      Subjective:  Seen and examined. She feels much better. Ankle pain and right thumb pain has significantly improved since dose of prednisone increased. No new complaint.  Objective: Vitals:   08/12/19 1604 08/12/19 1646 08/12/19 2100 08/13/19 0758  BP: (!) 85/49  (!) 87/53 (!) 88/50 (!) 101/54  Pulse: 80 80 78 (!) 58  Resp:  18 18 19   Temp:  98.3 F (36.8 C) 98 F (36.7 C) 98.2 F (36.8 C)  TempSrc:   Oral   SpO2:  100% 100% 100%  Weight:      Height:       No intake or output data in the 24 hours ending 08/13/19 1659 Filed Weights   08/10/19 1640 08/12/19 1242 08/12/19 1558  Weight: 51.8 kg 52.8 kg 51.6 kg    Examination:  General exam: Appears calm and comfortable  Respiratory system: Clear to auscultation. Respiratory effort normal. Cardiovascular system: S1 & S2 heard, RRR. No JVD, murmurs, rubs, gallops or clicks. No pedal edema. Gastrointestinal system: Abdomen is nondistended, soft and nontender. No organomegaly or masses felt. Normal bowel sounds heard. Central nervous system: Alert and oriented. No focal neurological deficits. Extremities: Symmetric 5 x 5 power. Minimal edema and minimal tenderness at right thumb. Skin: No rashes, lesions or ulcers.  Psychiatry: Judgement and insight appear normal. Mood & affect appropriate.    Data Reviewed: I have personally reviewed following labs and imaging studies  CBC: Recent Labs  Lab 08/07/19 0619 08/08/19 0357 08/09/19 0723 08/12/19 1302 08/13/19 1043  WBC 13.5* 11.1* 8.4 8.5 9.5  NEUTROABS 12.3* 10.2* 6.8  --  7.2  HGB 9.5* 9.4* 8.9* 7.4* 8.0*  HCT  29.7* 30.3* 27.0* 23.6* 25.6*  MCV 107.6* 108.2* 102.3* 105.4* 107.1*  PLT 94* 102* 103* 121* 937*   Basic Metabolic Panel: Recent Labs  Lab 08/09/19 0302 08/10/19 0644 08/11/19 0805 08/12/19 0359 08/13/19 0331  NA 137 136 139 138 138  K 3.7 3.6 3.7 3.8 5.0  CL 100 98 102 101 105  CO2 24 25 24 25 22   GLUCOSE 92 100* 80 104* 107*  BUN 20 33* 22* 36* 40*  CREATININE 2.71* 4.30* 3.36* 4.53* 3.16*  CALCIUM 8.4* 8.9 8.7* 8.8* 8.4*  PHOS 3.8 4.5 3.2 3.1 2.9   GFR: Estimated Creatinine Clearance: 14.4 mL/min (A) (by C-G formula based on SCr of 3.16 mg/dL (H)). Liver Function Tests: Recent Labs  Lab 08/09/19 0302  08/10/19 0644 08/11/19 0805 08/12/19 0359 08/13/19 0331  ALBUMIN 2.6* 2.8* 2.8* 2.6* 2.7*   No results for input(s): LIPASE, AMYLASE in the last 168 hours. No results for input(s): AMMONIA in the last 168 hours. Coagulation Profile: No results for input(s): INR, PROTIME in the last 168 hours. Cardiac Enzymes: No results for input(s): CKTOTAL, CKMB, CKMBINDEX, TROPONINI in the last 168 hours. BNP (last 3 results) No results for input(s): PROBNP in the last 8760 hours. HbA1C: No results for input(s): HGBA1C in the last 72 hours. CBG: No results for input(s): GLUCAP in the last 168 hours. Lipid Profile: No results for input(s): CHOL, HDL, LDLCALC, TRIG, CHOLHDL, LDLDIRECT in the last 72 hours. Thyroid Function Tests: No results for input(s): TSH, T4TOTAL, FREET4, T3FREE, THYROIDAB in the last 72 hours. Anemia Panel: No results for input(s): VITAMINB12, FOLATE, FERRITIN, TIBC, IRON, RETICCTPCT in the last 72 hours. Sepsis Labs: Recent Labs  Lab 08/07/19 3428  PROCALCITON 11.50    Recent Results (from the past 240 hour(s))  Blood Culture (routine x 2)     Status: None   Collection Time: 08/05/19 12:05 PM   Specimen: BLOOD  Result Value Ref Range Status   Specimen Description BLOOD RIGHT ANTECUBITAL  Final   Special Requests   Final    BOTTLES DRAWN AEROBIC AND ANAEROBIC Blood Culture adequate volume   Culture   Final    NO GROWTH 5 DAYS Performed at [facility], [address], [place], [place] [address]    Report Status 08/10/2019 FINAL  Final  Blood Culture (routine x 2)     Status: None   Collection Time: 08/05/19  2:31 PM   Specimen: BLOOD LEFT ARM  Result Value Ref Range Status   Specimen Description BLOOD LEFT ARM  Final   Special Requests   Final    BOTTLES DRAWN AEROBIC ONLY Blood Culture results may not be optimal due to an inadequate volume of blood received in culture bottles   Culture   Final    NO GROWTH 5 DAYS Performed at [facility],  [place] [address] [place], [place] [address]    Report Status 08/10/2019 FINAL  Final  Urine culture     Status: None   Collection Time: 08/05/19  6:36 PM   Specimen: In/Out Cath Urine  Result Value Ref Range Status   Specimen Description IN/OUT CATH URINE  Final   Special Requests NONE  Final   Culture   Final    NO GROWTH Performed at [facility], [place] [address], [place], [place] [address]    Report Status 08/06/2019 FINAL  Final  SARS Coronavirus 2 by RT PCR (hospital order, performed in [facility] hospital lab) Nasopharyngeal Nasopharyngeal  Swab     Status: None   Collection Time: 08/05/19  8:15 PM   Specimen: Nasopharyngeal Swab  Result Value Ref Range Status   SARS Coronavirus 2 NEGATIVE NEGATIVE Final    Comment: (NOTE) SARS-CoV-2 target nucleic acids are NOT DETECTED.  The SARS-CoV-2 RNA is generally detectable in upper and lower respiratory specimens during the acute phase of infection. The lowest concentration of SARS-CoV-2 viral copies this assay can detect is 250 copies / mL. A negative result does not preclude SARS-CoV-2 infection and should not be used as the sole basis for treatment or other patient management decisions.  A negative result may occur with improper specimen collection / handling, submission of specimen other than nasopharyngeal swab, presence of viral mutation(s) within the areas targeted by this assay, and inadequate number of viral copies (<250 copies / mL). A negative result must be combined with clinical observations, patient history, and epidemiological information.  Fact Sheet for Patients:   StrictlyIdeas.no  Fact Sheet for Healthcare Providers: BankingDealers.co.za  This test is not yet approved or  cleared by the [place] FDA and has been authorized for detection and/or diagnosis of SARS-CoV-2 by FDA under an Emergency Use Authorization (EUA).  This EUA will remain in effect (meaning  this test can be used) for the duration of the COVID-19 declaration under Section 564(b)(1) of the Act, 21 U.S.C. section 360bbb-3(b)(1), unless the authorization is terminated or revoked sooner.  Performed at [facility], [place] [address] [place], [place] [address]   MRSA PCR Screening     Status: None   Collection Time: 08/06/19 11:55 PM   Specimen: Nasal Mucosa; Nasopharyngeal  Result Value Ref Range Status   MRSA by PCR NEGATIVE NEGATIVE Final    Comment:        The GeneXpert MRSA Assay (FDA approved for NASAL specimens only), is one component of a comprehensive MRSA colonization surveillance program. It is not intended to diagnose MRSA infection nor to guide or monitor treatment for MRSA infections. Performed at [facility], [place] [address], [place],  [address]          Radiology Studies: No results found.      Scheduled Meds: . allopurinol  100 mg Oral Daily  . aspirin EC  81 mg Oral Daily  . Chlorhexidine Gluconate Cloth  6 each Topical Q0600  . Chlorhexidine Gluconate Cloth  6 each Topical Q0600  . Chlorhexidine Gluconate Cloth  6 each Topical Q0600  . Chlorhexidine Gluconate Cloth  6 each Topical Q0600  . colchicine  0.3 mg Oral Daily  . darbepoetin (ARANESP) injection - DIALYSIS  100 mcg Intravenous Q Tue-HD  . famotidine  20 mg Oral Daily  . feeding supplement (NEPRO CARB STEADY)  237 mL Oral BID BM  . feeding supplement (PRO-STAT SUGAR FREE 64)  30 mL Oral BID  . gabapentin  100 mg Oral BID  . guaiFENesin  600 mg Oral BID  . heparin  5,000 Units Subcutaneous Q12H  . latanoprost  1 drop Left Eye QHS  . midodrine  10 mg Oral BID WC  . multivitamin  1 tablet Oral QHS  . predniSONE  20 mg Oral BID WC  . tacrolimus  2 mg Oral QHS  . [START ON 08/14/2019] tacrolimus  3 mg Oral q morning - 10a  . Vitamin D (Ergocalciferol)  50,000 Units Oral Q Sun   Continuous Infusions:    LOS: 8 days   Time spent: 28 mins  Darliss Cheney, MD  [facility] P7/12/2019, 4:59 PM

## 2019-08-13 NOTE — Progress Notes (Signed)
[facility] Progress Note  Subjective: pt seen in room, asking about getting back on allopurinol. pmd went back up on pred to 20 bid due to persistent R hand pain  Vitals:   08/12/19 1604 08/12/19 1646 08/12/19 2100 08/13/19 0758  BP: (!) 85/49 (!) 87/53 (!) 88/50 (!) 101/54  Pulse: 80 80 78 (!) 58  Resp:  18 18 19   Temp:  98.3 F (36.8 C) 98 F (36.7 C) 98.2 F (36.8 C)  TempSrc:   Oral   SpO2:  100% 100% 100%  Weight:      Height:        Exam: General:NAD, comfortable. Heart:RRR, s1s2 nl, no rubs Lungs: Clear bilateral, no wheezing Abdomen:soft, Non-tender, non-distended Extremities: UE/ LE edema better, some R hip edema. R hand still swollen from gout but improved Dialysis Access: Right femoral HD catheter    OP HD: TTS [facility]    4h  57.5kg  2/2 bath  Hep 1000  R fem TDC  mircera 100 q2 last 6/24  Assessment/ Plan: 1. HCAP- suspected LLL PNA, treated with IV vancomycin and Zosyn.  Clinically improved. Completed abx course. 2. Acute gout flare: On colchicine and steroid, pred ^'d 20 bid for pain. Have d/w pt's renal MD, will restart allopurinol since pt is now off of Imuran. Maybe this will help.  3. ESRD / failed renal Tx: recently resumed HD in May 2021. TTS HD. Wt's down to 52kg which is 5-6 kg under. Next HD Tuesday.  4. Hypotension/Vol overload: Chronic hypotension therefore on midodrine.  5. 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 Will get trough level of prograf. Imuran was stopped in June d/t leukopenia, will keep this off.  6. Anemia of ckd: Started Aranesp.  No iron because of infection. 7. MBD ckd: Calcium phosphorus okay.  Monitor lab. 8. H/o COVID infection - prior prolonged COVID admit in April. COVID negative on admission here.  9. Deconditioning - awaiting SNF placement   Rob Tyan Lasure 08/13/2019, 3:01 PM   Recent Labs  Lab 08/09/19 0723 08/12/19 0359 08/12/19 1302 08/13/19 0331 08/13/19 1043  K   --  3.8  --  5.0  --   BUN  --  36*  --  40*  --   CREATININE  --  4.53*  --  3.16*  --   CALCIUM  --  8.8*  --  8.4*  --   PHOS  --  3.1  --  2.9  --   HGB   < >  --  7.4*  --  8.0*   < > = values in this interval not displayed.   Inpatient medications: . allopurinol  100 mg Oral Daily  . aspirin EC  81 mg Oral Daily  . Chlorhexidine Gluconate Cloth  6 each Topical Q0600  . Chlorhexidine Gluconate Cloth  6 each Topical Q0600  . Chlorhexidine Gluconate Cloth  6 each Topical Q0600  . Chlorhexidine Gluconate Cloth  6 each Topical Q0600  . colchicine  0.3 mg Oral Daily  . darbepoetin (ARANESP) injection - DIALYSIS  100 mcg Intravenous Q Tue-HD  . famotidine  20 mg Oral Daily  . feeding supplement (NEPRO CARB STEADY)  237 mL Oral BID BM  . feeding supplement (PRO-STAT SUGAR FREE 64)  30 mL Oral BID  . gabapentin  100 mg Oral BID  . guaiFENesin  600 mg Oral BID  . heparin  5,000 Units Subcutaneous Q12H  . latanoprost  1 drop  Left Eye QHS  . midodrine  10 mg Oral BID WC  . multivitamin  1 tablet Oral QHS  . predniSONE  20 mg Oral BID WC  . tacrolimus  2 mg Oral QHS  . [START ON 08/14/2019] tacrolimus  3 mg Oral q morning - 10a  . Vitamin D (Ergocalciferol)  50,000 Units Oral Q Sun    albuterol, albuterol, diphenhydrAMINE, fluticasone, HYDROmorphone, lidocaine, nystatin, ondansetron, zolpidem

## 2019-08-13 NOTE — Progress Notes (Addendum)
Received call from [facility] in lab with hemoglobin 6.9 result. Notified Dr Myna Hidalgo.  [phone] New orders received for Type and cross and transfusion orders for 1 unit PRBC.

## 2019-08-14 LAB — CBC WITH DIFFERENTIAL/PLATELET
Abs Immature Granulocytes: 0.54 10*3/uL — ABNORMAL HIGH (ref 0.00–0.07)
Basophils Absolute: 0 10*3/uL (ref 0.0–0.1)
Basophils Relative: 0 %
Eosinophils Absolute: 0 10*3/uL (ref 0.0–0.5)
Eosinophils Relative: 0 %
HCT: 21.9 % — ABNORMAL LOW (ref 36.0–46.0)
Hemoglobin: 6.9 g/dL — CL (ref 12.0–15.0)
Immature Granulocytes: 7 %
Lymphocytes Relative: 4 %
Lymphs Abs: 0.3 10*3/uL — ABNORMAL LOW (ref 0.7–4.0)
MCH: 33.8 pg (ref 26.0–34.0)
MCHC: 31.5 g/dL (ref 30.0–36.0)
MCV: 107.4 fL — ABNORMAL HIGH (ref 80.0–100.0)
Monocytes Absolute: 0.3 10*3/uL (ref 0.1–1.0)
Monocytes Relative: 4 %
Neutro Abs: 6.3 10*3/uL (ref 1.7–7.7)
Neutrophils Relative %: 85 %
Platelets: 130 10*3/uL — ABNORMAL LOW (ref 150–400)
RBC: 2.04 MIL/uL — ABNORMAL LOW (ref 3.87–5.11)
RDW: 23.9 % — ABNORMAL HIGH (ref 11.5–15.5)
WBC: 7.5 10*3/uL (ref 4.0–10.5)
nRBC: 1.7 % — ABNORMAL HIGH (ref 0.0–0.2)

## 2019-08-14 LAB — RETICULOCYTES
Immature Retic Fract: 29.1 % — ABNORMAL HIGH (ref 2.3–15.9)
RBC.: 2.64 MIL/uL — ABNORMAL LOW (ref 3.87–5.11)
Retic Count, Absolute: 243.1 10*3/uL — ABNORMAL HIGH (ref 19.0–186.0)
Retic Ct Pct: 9.2 % — ABNORMAL HIGH (ref 0.4–3.1)

## 2019-08-14 LAB — RENAL FUNCTION PANEL
Albumin: 2.8 g/dL — ABNORMAL LOW (ref 3.5–5.0)
Anion gap: 12 (ref 5–15)
BUN: 60 mg/dL — ABNORMAL HIGH (ref 6–20)
CO2: 25 mmol/L (ref 22–32)
Calcium: 9 mg/dL (ref 8.9–10.3)
Chloride: 102 mmol/L (ref 98–111)
Creatinine, Ser: 4.6 mg/dL — ABNORMAL HIGH (ref 0.44–1.00)
GFR calc Af Amer: 12 mL/min — ABNORMAL LOW (ref >60)
GFR calc non Af Amer: 10 mL/min — ABNORMAL LOW (ref >60)
Glucose, Bld: 113 mg/dL — ABNORMAL HIGH (ref 70–99)
Phosphorus: 4.1 mg/dL (ref 2.5–4.6)
Potassium: 4.4 mmol/L (ref 3.5–5.1)
Sodium: 139 mmol/L (ref 135–145)

## 2019-08-14 LAB — HEMOGLOBIN AND HEMATOCRIT, BLOOD
HCT: 26.8 % — ABNORMAL LOW (ref 36.0–46.0)
Hemoglobin: 8.4 g/dL — ABNORMAL LOW (ref 12.0–15.0)

## 2019-08-14 LAB — IRON AND TIBC
Iron: 127 ug/dL (ref 28–170)
Saturation Ratios: 73 % — ABNORMAL HIGH (ref 10.4–31.8)
TIBC: 175 ug/dL — ABNORMAL LOW (ref 250–450)
UIBC: 48 ug/dL

## 2019-08-14 LAB — FERRITIN: Ferritin: 1389 ng/mL — ABNORMAL HIGH (ref 11–307)

## 2019-08-14 LAB — PREPARE RBC (CROSSMATCH)

## 2019-08-14 MED ORDER — SODIUM CHLORIDE 0.9% IV SOLUTION: Freq: Once | INTRAVENOUS | Status: AC

## 2019-08-14 MED ORDER — CHLORHEXIDINE GLUCONATE CLOTH 2 % EX PADS
6.0000 | MEDICATED_PAD | Freq: Every day | CUTANEOUS | Status: DC
  Administered 2019-08-15 – 2019-08-23 (×7): 6 via TOPICAL

## 2019-08-14 NOTE — Progress Notes (Signed)
Initial Nutrition Assessment  DOCUMENTATION CODES:   Not applicable  INTERVENTION:   - Nepro Shake po BID, each supplement provides 425 kcal and 19 grams protein - Pro-stat 30 ml po BID, each supplement provides 100 kcal and 15 grams protein - Renal MVI daily  NUTRITION DIAGNOSIS:   Increased nutrient needs related to chronic illness (ESRD on HD, CHF) as evidenced by estimated needs.  Ongoing  GOAL:   Patient will meet greater than or equal to 90% of their needs   Progressing  MONITOR:   PO intake, Supplement acceptance, Labs, Weight trends, Skin, I & O's  REASON FOR ASSESSMENT:   Malnutrition Screening Tool    ASSESSMENT:   54 year old female who presented on 7/03 with fever, joint pain, weakness. PMH of ESRD s/p renal transplant complicated by CKD stage IV on chronic steroids and immunosuppressive therapy recently progressed to HD, chronic anemia secondary to CKD, HTN, TIA, CHF, pulmonary HTN. Admitted with HCAP.   Pt reports appetite has progressed significantly. Last meal completion charted as 50%. Per pt she is taking both Prostat and Nepro BID. Discussed certain menu items pt had questions about. Continue current interventions.   Will attempt to obtain repeat NFPE at follow up. Suspect PCM.   EDW: 57.5 kg  Admission weight: 57.9 kg  Current weight: 51.6 kg   Medications: aranesp, rena-vit, prednisone, Vit D Labs: CBG 80-113  Diet Order:   Diet Order            Diet renal with fluid restriction Fluid restriction: 1200 mL Fluid; Room service appropriate? Yes; Fluid consistency: Thin  Diet effective now           Diet - low sodium heart healthy                 EDUCATION NEEDS:   No education needs have been identified at this time  Skin:  Skin Assessment: Skin Integrity Issues: Stage II: sacrum  Last BM:  7/10  Height:   Ht Readings from Last 1 Encounters:  08/05/19 4\' 11"  (1.499 m)    Weight:   Wt Readings from Last 1 Encounters:   08/12/19 51.6 kg    BMI:  Body mass index is 22.98 kg/m.  Estimated Nutritional Needs:   Kcal:  1700-1900  Protein:  85-100 grams  Fluid:  1000 ml + UOP  Mariana Single RD, LDN Clinical Nutrition Pager listed in [place]

## 2019-08-14 NOTE — Progress Notes (Signed)
[facility] Progress Note  Subjective: pt seen in room, gout seems to be improving, +cough  Vitals:   08/13/19 1909 08/14/19 0742 08/14/19 1206 08/14/19 1235  BP: (!) 103/58 (!) 103/57 102/61 104/67  Pulse: 68 (!) 56 (!) 54 (!) 58  Resp: 18 20 20 20   Temp: 98.1 F (36.7 C) 98.1 F (36.7 C) 98.1 F (36.7 C) 98.4 F (36.9 C)  TempSrc: Oral  Oral Oral  SpO2: 100% 97% 98% 97%  Weight:      Height:        Exam: General:NAD, comfortable. Heart:RRR, s1s2 nl, no rubs Lungs: rales/ rhonchi R base, L clear Abdomen:soft, Non-tender, non-distended Extremities: bilat thigh edema/ R breast 2+  R hand erythema/ edema much better Dialysis Access: Right femoral HD catheter    OP HD: [facility]TS [facility]    4h  57.5kg  2/2 bath  Hep 1000  R fem TDC  mircera 100 q2 last 6/24  Assessment/ Plan: 1. HCAP- suspected LLL PNA, treated with IV vancomycin and Zosyn.  Clinically improved. Completed abx course. 2. Acute gout flare: On colchicine and steroid, pred ^'d 20 bid for pain. Have restarted allopurinol since pt is now off of Imuran.  3. ESRD / failed renal Tx: recently resumed HD in May 2021. TTS HD. Is under 5kg by wts but sig edema still on exam. Max UF w/ HD tomorrow.  4. Hypotension/Vol overload: Chronic hypotension therefore on midodrine.  5. 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 Will get trough level of prograf. Imuran was stopped in June d/t leukopenia, will keep this off.  6. Anemia of ckd: Started Aranesp.  No iron because of infection. 7. MBD ckd: Calcium phosphorus okay.  Monitor lab. 8. H/o COVID infection - prior prolonged COVID admit in April. COVID negative on admission here.  9. Deconditioning - awaiting SNF placement   Rob Dianna Deshler 08/14/2019, 2:23 PM   Recent Labs  Lab 08/12/19 1302 08/13/19 0331 08/13/19 1043 08/14/19 0338  K  --  5.0  --  4.4  BUN  --  40*  --  60*  CREATININE  --  3.16*  --  4.60*  CALCIUM  --  8.4*   --  9.0  PHOS  --  2.9  --  4.1  HGB   < >  --  8.0* 6.9*   < > = values in this interval not displayed.   Inpatient medications: . sodium chloride   Intravenous Once  . allopurinol  100 mg Oral Daily  . aspirin EC  81 mg Oral Daily  . Chlorhexidine Gluconate Cloth  6 each Topical Q0600  . Chlorhexidine Gluconate Cloth  6 each Topical Q0600  . Chlorhexidine Gluconate Cloth  6 each Topical Q0600  . Chlorhexidine Gluconate Cloth  6 each Topical Q0600  . colchicine  0.3 mg Oral Daily  . darbepoetin (ARANESP) injection - DIALYSIS  100 mcg Intravenous Q Tue-HD  . famotidine  20 mg Oral Daily  . feeding supplement (NEPRO CARB STEADY)  237 mL Oral BID BM  . feeding supplement (PRO-STAT SUGAR FREE 64)  30 mL Oral BID  . gabapentin  100 mg Oral BID  . guaiFENesin  600 mg Oral BID  . heparin  5,000 Units Subcutaneous Q12H  . latanoprost  1 drop Left Eye QHS  . midodrine  10 mg Oral BID WC  . multivitamin  1 tablet Oral QHS  . predniSONE  20 mg Oral BID WC  .  tacrolimus  2 mg Oral QHS  . tacrolimus  3 mg Oral q morning - 10a  . Vitamin D (Ergocalciferol)  50,000 Units Oral Q Sun    albuterol, albuterol, diphenhydrAMINE, fluticasone, HYDROmorphone, lidocaine, nystatin, ondansetron, zolpidem

## 2019-08-14 NOTE — Progress Notes (Signed)
PROGRESS NOTE    Tina Watkins  XMI:[id number] DOB: 1965/08/14 DOA: 08/05/2019 PCP: [name], FNP   Brief Narrative: Patient is a 15-year female with history of ESRD status post renal transplant complicated by CKD stage IV on chronic steroids and immunosuppressive therapy, chronic anemia secondary to CKD, hypertension, TIA, chronic diastolic congestive heart failure, moderate pulmonary hypertension who presented with multiple joints pain which significantly limited her mobility. Symptoms started about a week ago. It involved right wrist and several fingers and right elbow and bilateral knees and ankles. She also had low-grade fever. Patient also developed productive cough with yellow sputum. She was started on hemodialysis this June because of failed kidney transplant.She has a right-sided femoral HD catheter for dialysis. On presentation, chest x-ray showed possible left lower lobe pneumonia. Admitted for further management.Startedon IV antibiotics and being also managed for acute gout flare.  Hospital course remained stable.  She was seen by PT and recommended home health on discharge.  She has chronic pain due to her gouty arthritis.  Her respiratory status is stable and she is maintaining her saturation on room air.  PT recommended SNF. Pending placement  Assessment & Plan:   Active Problems:   HCAP (healthcare-associated pneumonia)  Healthcare associated pneumonia: Presented with cough, new infiltrate on the chest x-ray.Leukocytosis.started on vancomycin and Zosyn.Elevated procalcitonin. Presented with fever. Since MRSA PCR is negative, vancomycin stopped.  Afebrile now.  Cultures have remained negative.  Her respiratory status is stable.  Currently on room air.  Antibiotics changed to oral Augmentin which she completed course on 08/11/2019.  ESRD on hemodialysis/failed renal transplant x3:Nephrology following. She has right-sided femoral dialysis  catheter. She has extremely limited IV access on the upper extremities. She is following with vascular surgery as an outpatient for permanent access. Patient still produces urine.    She gets dialyzed on TTS schedule.  Acute gouty flareup: Presented with pain on the multiple joints, more pronounced in bilateral ankle and right thumb.  Colchicine restarted due to severe pain. Dose of steroid has been increased from her baseline of 5 mg p.o. daily to 20 mg p.o. daily which was increased to twice daily on 08/12/2019 and patient continues to feel better. Her ankle pain and right thumb pain is improved. Her right thumb is even much less swollen compared to yesterday and it is very minimally tender as opposed to excruciating tender yesterday. Continue current dose of steroids for now and will taper at the time of discharge.  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 Her nephrologist is still recommending to continue steroid and tacrolimus. Imuran was discontinued last month due to worsening pancytopenia.  Chronic  hypotension:On midodrine. BP soft. She has chronic hypotension.  Leukocytosis:Leukocytosis most likely associated with her acute gout flareup. resolved  Anemia of chronic disease/thrombocytopenia: Chronic microcytosis.  B12 and folate were normal.  Could be due to kidney disease.  Hemoglobin dropped to 6.9 today.  1 unit of PRBC transfusion ordered by night physician. Will check FOBT, reticulocyte, ferritin, TIBC and iron.  Chronic diastolic heart failure: Volume managed by dialysis. Last echo on 12/20 showed restrictive cardiomyopathy, grade 3 diastolic dysfunction, preserved ejection fraction, moderate pulmonary hypertension  History of Covid infection:On April 2019. Presented with Covid pneumonia and had prolonged hospitalization.  Debility/deconditioning: She is severely deconditioned due to her debilitating chronic gouty arthritis. PT  recommended skilled nursing facility on discharge.  Continue supportive care.  TOC on board.   Nutrition Problem: Increased nutrient  needs Etiology: chronic illness (ESRD on HD, CHF)      DVT prophylaxis: Heparin subcu Code Status: Full code Family Communication: Discussed with patient  status is: Inpatient  Remains inpatient appropriate because:unsafe DC plan  Dispo: The patient is from: Home              Anticipated d/c is to: SNF              Anticipated d/c date is: As soon as bed is available              Patient currently is medically stable for discharge to snf  Consultants: Nephrology  Procedures: Hemodialysis  Antimicrobials:  Anti-infectives (From admission, onward)   Start     Dose/Rate Route Frequency Ordered Stop   08/08/19 1200  vancomycin (VANCOCIN) IVPB 500 mg/100 ml premix  Status:  Discontinued        500 mg 100 mL/hr over 60 Minutes Intravenous Every T-Th-Sa (Hemodialysis) 08/05/19 1522 08/07/19 0748   08/08/19 1200  amoxicillin-clavulanate (AUGMENTIN) 500-125 MG per tablet 500 mg        1 tablet Oral 2 times daily 08/08/19 1101 08/11/19 1900   08/08/19 0000  amoxicillin-clavulanate (AUGMENTIN) 500-125 MG tablet  Status:  Discontinued        1 tablet Oral 2 times daily 08/08/19 1233 08/11/19    08/05/19 2030  piperacillin-tazobactam (ZOSYN) IVPB 2.25 g  Status:  Discontinued        2.25 g 100 mL/hr over 30 Minutes Intravenous Every 8 hours 08/05/19 1522 08/08/19 1101   08/05/19 1230  vancomycin (VANCOCIN) IVPB 1000 mg/200 mL premix        1,000 mg 200 mL/hr over 60 Minutes Intravenous  Once 08/05/19 1219 08/05/19 1412   08/05/19 1230  piperacillin-tazobactam (ZOSYN) IVPB 3.375 g        3.375 g 100 mL/hr over 30 Minutes Intravenous  Once 08/05/19 1219 08/05/19 1328      Subjective:  Patient seen and examined.  She has no complaints today.  No thumb pain or ankle pain that she mentioned.  Objective: Vitals:   08/13/19 1909 08/14/19 0742 08/14/19  1206 08/14/19 1235  BP: (!) 103/58 (!) 103/57 102/61 104/67  Pulse: 68 (!) 56 (!) 54 (!) 58  Resp: 18 20 20 20   Temp: 98.1 F (36.7 C) 98.1 F (36.7 C) 98.1 F (36.7 C) 98.4 F (36.9 C)  TempSrc: Oral  Oral Oral  SpO2: 100% 97% 98% 97%  Weight:      Height:        Intake/Output Summary (Last 24 hours) at 08/14/2019 1327 Last data filed at 08/14/2019 1206 Gross per 24 hour  Intake 325 ml  Output --  Net 325 ml   Filed Weights   08/10/19 1640 08/12/19 1242 08/12/19 1558  Weight: 51.8 kg 52.8 kg 51.6 kg    Examination:  General exam: Appears calm and comfortable  Respiratory system: Clear to auscultation. Respiratory effort normal. Cardiovascular system: S1 & S2 heard, RRR. No JVD, murmurs, rubs, gallops or clicks. No pedal edema. Gastrointestinal system: Abdomen is nondistended, soft and nontender. No organomegaly or masses felt. Normal bowel sounds heard. Central nervous system: Alert and oriented. No focal neurological deficits. Extremities: Symmetric 5 x 5 power.  Minimally edematous right thumb with very minimal tenderness at the base of the thumb. Skin: No rashes, lesions or ulcers.  Psychiatry: Judgement and insight appear normal. Mood & affect appropriate.   Data Reviewed: I have personally reviewed following  labs and imaging studies  CBC: Recent Labs  Lab 08/08/19 0357 08/09/19 0723 08/12/19 1302 08/13/19 1043 08/14/19 0338  WBC 11.1* 8.4 8.5 9.5 7.5  NEUTROABS 10.2* 6.8  --  7.2 6.3  HGB 9.4* 8.9* 7.4* 8.0* 6.9*  HCT 30.3* 27.0* 23.6* 25.6* 21.9*  MCV 108.2* 102.3* 105.4* 107.1* 107.4*  PLT 102* 103* 121* 131* 161*   Basic Metabolic Panel: Recent Labs  Lab 08/10/19 0644 08/11/19 0805 08/12/19 0359 08/13/19 0331 08/14/19 0338  NA 136 139 138 138 139  K 3.6 3.7 3.8 5.0 4.4  CL 98 102 101 105 102  CO2 25 24 25 22 25   GLUCOSE 100* 80 104* 107* 113*  BUN 33* 22* 36* 40* 60*  CREATININE 4.30* 3.36* 4.53* 3.16* 4.60*  CALCIUM 8.9 8.7* 8.8* 8.4* 9.0   PHOS 4.5 3.2 3.1 2.9 4.1   GFR: Estimated Creatinine Clearance: 9.9 mL/min (A) (by C-G formula based on SCr of 4.6 mg/dL (H)). Liver Function Tests: Recent Labs  Lab 08/10/19 0644 08/11/19 0805 08/12/19 0359 08/13/19 0331 08/14/19 0338  ALBUMIN 2.8* 2.8* 2.6* 2.7* 2.8*   No results for input(s): LIPASE, AMYLASE in the last 168 hours. No results for input(s): AMMONIA in the last 168 hours. Coagulation Profile: No results for input(s): INR, PROTIME in the last 168 hours. Cardiac Enzymes: No results for input(s): CKTOTAL, CKMB, CKMBINDEX, TROPONINI in the last 168 hours. BNP (last 3 results) No results for input(s): PROBNP in the last 8760 hours. HbA1C: No results for input(s): HGBA1C in the last 72 hours. CBG: No results for input(s): GLUCAP in the last 168 hours. Lipid Profile: No results for input(s): CHOL, HDL, LDLCALC, TRIG, CHOLHDL, LDLDIRECT in the last 72 hours. Thyroid Function Tests: No results for input(s): TSH, T4TOTAL, FREET4, T3FREE, THYROIDAB in the last 72 hours. Anemia Panel: No results for input(s): VITAMINB12, FOLATE, FERRITIN, TIBC, IRON, RETICCTPCT in the last 72 hours. Sepsis Labs: No results for input(s): PROCALCITON, LATICACIDVEN in the last 168 hours.  Recent Results (from the past 240 hour(s))  Blood Culture (routine x 2)     Status: None   Collection Time: 08/05/19 12:05 PM   Specimen: BLOOD  Result Value Ref Range Status   Specimen Description BLOOD RIGHT ANTECUBITAL  Final   Special Requests   Final    BOTTLES DRAWN AEROBIC AND ANAEROBIC Blood Culture adequate volume   Culture   Final    NO GROWTH 5 DAYS Performed at [facility], [address] [place], [place] [address]    Report Status 08/10/2019 FINAL  Final  Blood Culture (routine x 2)     Status: None   Collection Time: 08/05/19  2:31 PM   Specimen: BLOOD LEFT ARM  Result Value Ref Range Status   Specimen Description BLOOD LEFT ARM  Final   Special Requests   Final     BOTTLES DRAWN AEROBIC ONLY Blood Culture results may not be optimal due to an inadequate volume of blood received in culture bottles   Culture   Final    NO GROWTH 5 DAYS Performed at [facility], [place] [address], [place], [place] [address]    Report Status 08/10/2019 FINAL  Final  Urine culture     Status: None   Collection Time: 08/05/19  6:36 PM   Specimen: In/Out Cath Urine  Result Value Ref Range Status   Specimen Description IN/OUT CATH URINE  Final   Special Requests NONE  Final   Culture  Final    NO GROWTH Performed at [facility], [place] [address] [place], [place] [address]    Report Status 08/06/2019 FINAL  Final  SARS Coronavirus 2 by RT PCR (hospital order, performed in [facility] hospital lab) Nasopharyngeal Nasopharyngeal Swab     Status: None   Collection Time: 08/05/19  8:15 PM   Specimen: Nasopharyngeal Swab  Result Value Ref Range Status   SARS Coronavirus 2 NEGATIVE NEGATIVE Final    Comment: (NOTE) SARS-CoV-2 target nucleic acids are NOT DETECTED.  The SARS-CoV-2 RNA is generally detectable in upper and lower respiratory specimens during the acute phase of infection. The lowest concentration of SARS-CoV-2 viral copies this assay can detect is 250 copies / mL. A negative result does not preclude SARS-CoV-2 infection and should not be used as the sole basis for treatment or other patient management decisions.  A negative result may occur with improper specimen collection / handling, submission of specimen other than nasopharyngeal swab, presence of viral mutation(s) within the areas targeted by this assay, and inadequate number of viral copies (<250 copies / mL). A negative result must be combined with clinical observations, patient history, and epidemiological information.  Fact Sheet for Patients:   StrictlyIdeas.no  Fact Sheet for Healthcare Providers: BankingDealers.co.za  This test is not  yet approved or  cleared by the [place] FDA and has been authorized for detection and/or diagnosis of SARS-CoV-2 by FDA under an Emergency Use Authorization (EUA).  This EUA will remain in effect (meaning this test can be used) for the duration of the COVID-19 declaration under Section 564(b)(1) of the Act, 21 U.S.C. section 360bbb-3(b)(1), unless the authorization is terminated or revoked sooner.  Performed at [facility], [place] [address] [place], [place] [address]   MRSA PCR Screening     Status: None   Collection Time: 08/06/19 11:55 PM   Specimen: Nasal Mucosa; Nasopharyngeal  Result Value Ref Range Status   MRSA by PCR NEGATIVE NEGATIVE Final    Comment:        The GeneXpert MRSA Assay (FDA approved for NASAL specimens only), is one component of a comprehensive MRSA colonization surveillance program. It is not intended to diagnose MRSA infection nor to guide or monitor treatment for MRSA infections. Performed at [facility], [place] [address], [place], [place] [address]          Radiology Studies: No results found.      Scheduled Meds: . sodium chloride   Intravenous Once  . allopurinol  100 mg Oral Daily  . aspirin EC  81 mg Oral Daily  . Chlorhexidine Gluconate Cloth  6 each Topical Q0600  . Chlorhexidine Gluconate Cloth  6 each Topical Q0600  . Chlorhexidine Gluconate Cloth  6 each Topical Q0600  . Chlorhexidine Gluconate Cloth  6 each Topical Q0600  . colchicine  0.3 mg Oral Daily  . darbepoetin (ARANESP) injection - DIALYSIS  100 mcg Intravenous Q Tue-HD  . famotidine  20 mg Oral Daily  . feeding supplement (NEPRO CARB STEADY)  237 mL Oral BID BM  . feeding supplement (PRO-STAT SUGAR FREE 64)  30 mL Oral BID  . gabapentin  100 mg Oral BID  . guaiFENesin  600 mg Oral BID  . heparin  5,000 Units Subcutaneous Q12H  . latanoprost  1 drop Left Eye QHS  . midodrine  10 mg Oral BID WC  . multivitamin  1 tablet Oral QHS  . predniSONE  20  mg Oral BID WC  . tacrolimus  2 mg Oral QHS  . tacrolimus  3 mg Oral q morning - 10a  . Vitamin D (Ergocalciferol)  50,000 Units Oral Q Sun   Continuous Infusions:    LOS: 9 days   Time spent: 27 mins  Darliss Cheney, MD [facility] P7/01/2020, 1:27 PM

## 2019-08-15 ENCOUNTER — Ambulatory Visit: Payer: Medicaid Other | Admitting: Vascular Surgery

## 2019-08-15 LAB — RENAL FUNCTION PANEL
Albumin: 3 g/dL — ABNORMAL LOW (ref 3.5–5.0)
Anion gap: 15 (ref 5–15)
BUN: 75 mg/dL — ABNORMAL HIGH (ref 6–20)
CO2: 24 mmol/L (ref 22–32)
Calcium: 9.4 mg/dL (ref 8.9–10.3)
Chloride: 98 mmol/L (ref 98–111)
Creatinine, Ser: 5.73 mg/dL — ABNORMAL HIGH (ref 0.44–1.00)
GFR calc Af Amer: 9 mL/min — ABNORMAL LOW (ref >60)
GFR calc non Af Amer: 8 mL/min — ABNORMAL LOW (ref >60)
Glucose, Bld: 112 mg/dL — ABNORMAL HIGH (ref 70–99)
Phosphorus: 5.6 mg/dL — ABNORMAL HIGH (ref 2.5–4.6)
Potassium: 4.7 mmol/L (ref 3.5–5.1)
Sodium: 137 mmol/L (ref 135–145)

## 2019-08-15 LAB — BPAM RBC
Blood Product Expiration Date: 202107312359
ISSUE DATE / TIME: 202107121156
Unit Type and Rh: 5100

## 2019-08-15 LAB — TYPE AND SCREEN
ABO/RH(D): B POS
Antibody Screen: NEGATIVE
Unit division: 0

## 2019-08-15 LAB — CBC WITH DIFFERENTIAL/PLATELET
Abs Immature Granulocytes: 0.49 10*3/uL — ABNORMAL HIGH (ref 0.00–0.07)
Basophils Absolute: 0 10*3/uL (ref 0.0–0.1)
Basophils Relative: 1 %
Eosinophils Absolute: 0 10*3/uL (ref 0.0–0.5)
Eosinophils Relative: 0 %
HCT: 26.7 % — ABNORMAL LOW (ref 36.0–46.0)
Hemoglobin: 8.7 g/dL — ABNORMAL LOW (ref 12.0–15.0)
Immature Granulocytes: 6 %
Lymphocytes Relative: 3 %
Lymphs Abs: 0.3 10*3/uL — ABNORMAL LOW (ref 0.7–4.0)
MCH: 32.7 pg (ref 26.0–34.0)
MCHC: 32.6 g/dL (ref 30.0–36.0)
MCV: 100.4 fL — ABNORMAL HIGH (ref 80.0–100.0)
Monocytes Absolute: 0.3 10*3/uL (ref 0.1–1.0)
Monocytes Relative: 4 %
Neutro Abs: 6.9 10*3/uL (ref 1.7–7.7)
Neutrophils Relative %: 86 %
Platelets: 130 10*3/uL — ABNORMAL LOW (ref 150–400)
RBC: 2.66 MIL/uL — ABNORMAL LOW (ref 3.87–5.11)
RDW: 26.8 % — ABNORMAL HIGH (ref 11.5–15.5)
WBC: 8 10*3/uL (ref 4.0–10.5)
nRBC: 1.5 % — ABNORMAL HIGH (ref 0.0–0.2)

## 2019-08-15 MED ORDER — DARBEPOETIN ALFA 100 MCG/0.5ML IJ SOSY
PREFILLED_SYRINGE | INTRAMUSCULAR | Status: AC
  Administered 2019-08-15: 100 ug
  Filled 2019-08-15: qty 0.5

## 2019-08-15 MED ORDER — HEPARIN SODIUM (PORCINE) 1000 UNIT/ML IJ SOLN
INTRAMUSCULAR | Status: AC
  Administered 2019-08-15: 1000 [IU]
  Filled 2019-08-15: qty 5

## 2019-08-15 MED ORDER — HEPARIN SODIUM (PORCINE) 1000 UNIT/ML IJ SOLN
INTRAMUSCULAR | Status: AC
  Administered 2019-08-15: 1000 [IU]
  Filled 2019-08-15: qty 1

## 2019-08-15 MED ORDER — MIDODRINE HCL 5 MG PO TABS
ORAL_TABLET | ORAL | Status: AC
  Filled 2019-08-15: qty 2

## 2019-08-15 NOTE — Progress Notes (Signed)
PROGRESS NOTE    Tina Watkins  JIR:[id number] DOB: July 29, 1965 DOA: 08/05/2019 PCP: [name], FNP   Brief Narrative: Patient is a 49-year female with history of ESRD status post renal transplant complicated by CKD stage IV on chronic steroids and immunosuppressive therapy, chronic anemia secondary to CKD, hypertension, TIA, chronic diastolic congestive heart failure, moderate pulmonary hypertension who presented with multiple joints pain which significantly limited her mobility. Symptoms started about a week ago. It involved right wrist and several fingers and right elbow and bilateral knees and ankles. She also had low-grade fever. Patient also developed productive cough with yellow sputum. She was started on hemodialysis this June because of failed kidney transplant.She has a right-sided femoral HD catheter for dialysis. On presentation, chest x-ray showed possible left lower lobe pneumonia. Admitted for further management.Startedon IV antibiotics and being also managed for acute gout flare.  Hospital course remained stable.  She was seen by PT and recommended home health on discharge.  She has chronic pain due to her gouty arthritis.  Her respiratory status is stable and she is maintaining her saturation on room air.  PT recommended SNF. Pending placement  Assessment & Plan:   Active Problems:   HCAP (healthcare-associated pneumonia)  Healthcare associated pneumonia: Presented with cough, new infiltrate on the chest x-ray.Leukocytosis.started on vancomycin and Zosyn.Elevated procalcitonin. Presented with fever. Since MRSA PCR is negative, vancomycin stopped.  Afebrile now.  Cultures have remained negative.  Her respiratory status is stable.  Currently on room air.  Antibiotics changed to oral Augmentin which she completed course on 08/11/2019.  ESRD on hemodialysis/failed renal transplant x3:Nephrology following. She has right-sided femoral dialysis  catheter. She has extremely limited IV access on the upper extremities. She is following with vascular surgery as an outpatient for permanent access. Patient still produces urine.    She gets dialyzed on TTS schedule.  Acute gouty flareup: Presented with pain on the multiple joints, more pronounced in bilateral ankle and right thumb.  Colchicine restarted due to severe pain. Dose of steroid has been increased from her baseline of 5 mg p.o. daily to 20 mg p.o. daily which was increased to twice daily on 08/12/2019 and patient continues to feel better. Her ankle pain and right thumb pain is improved. Her right thumb is back to very close to being normal and is nontender. Continue current dose of steroids for 1 more day and then hopefully start tapering tomorrow.  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 Her nephrologist is still recommending to continue steroid and tacrolimus. Imuran was discontinued last month due to worsening pancytopenia.  Chronic  hypotension:On midodrine. BP soft. She has chronic hypotension.  Leukocytosis:Leukocytosis most likely associated with her acute gout flareup. resolved  Anemia of chronic disease/thrombocytopenia: Chronic microcytosis.  B12 and folate were normal.  Could be due to kidney disease.  Hemoglobin dropped to 6.9 today.  1 unit of PRBC transfusion ordered by night physician. Will check FOBT, reticulocyte, ferritin, TIBC and iron.  Chronic diastolic heart failure: Volume managed by dialysis. Last echo on 12/20 showed restrictive cardiomyopathy, grade 3 diastolic dysfunction, preserved ejection fraction, moderate pulmonary hypertension  History of Covid infection:On April 2019. Presented with Covid pneumonia and had prolonged hospitalization.  Debility/deconditioning: She is severely deconditioned due to her debilitating chronic gouty arthritis. PT recommended skilled nursing facility on discharge.  Patient  was very emotional in tears while she was telling me that she actually walked by herself yesterday with the help of  her friend and she now believes that she can actually go home instead of going to SNF.  She needs some equipment which will be delivered to her either today or tomorrow.  She will be seen by PT OT and based on that, she may go home with home health however patient seems to be unsure about going home today.   Nutrition Problem: Increased nutrient needs Etiology: chronic illness (ESRD on HD, CHF)    DVT prophylaxis: Heparin subcu Code Status: Full code Family Communication: Discussed with patient  status is: Inpatient  Remains inpatient appropriate because:unsafe DC plan  Dispo: The patient is from: Home              Anticipated d/c is to: SNF vs home with home health              Anticipated d/c date is: 08/16/2019              Patient currently is medically stable for discharge to snf  Consultants: Nephrology  Procedures: Hemodialysis  Antimicrobials:  Anti-infectives (From admission, onward)   Start     Dose/Rate Route Frequency Ordered Stop   08/08/19 1200  vancomycin (VANCOCIN) IVPB 500 mg/100 ml premix  Status:  Discontinued        500 mg 100 mL/hr over 60 Minutes Intravenous Every T-Th-Sa (Hemodialysis) 08/05/19 1522 08/07/19 0748   08/08/19 1200  amoxicillin-clavulanate (AUGMENTIN) 500-125 MG per tablet 500 mg        1 tablet Oral 2 times daily 08/08/19 1101 08/11/19 1900   08/08/19 0000  amoxicillin-clavulanate (AUGMENTIN) 500-125 MG tablet  Status:  Discontinued        1 tablet Oral 2 times daily 08/08/19 1233 08/11/19    08/05/19 2030  piperacillin-tazobactam (ZOSYN) IVPB 2.25 g  Status:  Discontinued        2.25 g 100 mL/hr over 30 Minutes Intravenous Every 8 hours 08/05/19 1522 08/08/19 1101   08/05/19 1230  vancomycin (VANCOCIN) IVPB 1000 mg/200 mL premix        1,000 mg 200 mL/hr over 60 Minutes Intravenous  Once 08/05/19 1219 08/05/19 1412   08/05/19  1230  piperacillin-tazobactam (ZOSYN) IVPB 3.375 g        3.375 g 100 mL/hr over 30 Minutes Intravenous  Once 08/05/19 1219 08/05/19 1328      Subjective:  Seen and examined this morning in dialysis unit.  Patient feels much better without any complaints.  Objective: Vitals:   08/15/19 1030 08/15/19 1100 08/15/19 1140 08/15/19 1150  BP: (!) 97/49 (!) 88/47 (!) 97/45 98/75  Pulse: 61 67 69 60  Resp: 14 20 20 20   Temp:    98.8 F (37.1 C)  TempSrc:    Axillary  SpO2:   98%   Weight:   55.5 kg   Height:        Intake/Output Summary (Last 24 hours) at 08/15/2019 1325 Last data filed at 08/15/2019 1140 Gross per 24 hour  Intake 675 ml  Output 1000 ml  Net -325 ml   Filed Weights   08/15/19 0500 08/15/19 0735 08/15/19 1140  Weight: 56.5 kg 56.5 kg 55.5 kg    Examination:  General exam: Appears calm and comfortable  Respiratory system: Clear to auscultation. Respiratory effort normal. Cardiovascular system: S1 & S2 heard, RRR. No JVD, murmurs, rubs, gallops or clicks. No pedal edema. Gastrointestinal system: Abdomen is nondistended, soft and nontender. No organomegaly or masses felt. Normal bowel sounds heard. Central nervous system: Alert and  oriented. No focal neurological deficits. Extremities: Symmetric 5 x 5 power. Skin: No rashes, lesions or ulcers.  Psychiatry: Judgement and insight appear normal. Mood & affect appropriate.   Data Reviewed: I have personally reviewed following labs and imaging studies  CBC: Recent Labs  Lab 08/09/19 0723 08/09/19 0723 08/12/19 1302 08/13/19 1043 08/14/19 0338 08/14/19 1645 08/15/19 0355  WBC 8.4  --  8.5 9.5 7.5  --  8.0  NEUTROABS 6.8  --   --  7.2 6.3  --  6.9  HGB 8.9*   < > 7.4* 8.0* 6.9* 8.4* 8.7*  HCT 27.0*   < > 23.6* 25.6* 21.9* 26.8* 26.7*  MCV 102.3*  --  105.4* 107.1* 107.4*  --  100.4*  PLT 103*  --  121* 131* 130*  --  130*   < > = values in this interval not displayed.   Basic Metabolic Panel: Recent  Labs  Lab 08/11/19 0805 08/12/19 0359 08/13/19 0331 08/14/19 0338 08/15/19 0355  NA 139 138 138 139 137  K 3.7 3.8 5.0 4.4 4.7  CL 102 101 105 102 98  CO2 24 25 22 25 24   GLUCOSE 80 104* 107* 113* 112*  BUN 22* 36* 40* 60* 75*  CREATININE 3.36* 4.53* 3.16* 4.60* 5.73*  CALCIUM 8.7* 8.8* 8.4* 9.0 9.4  PHOS 3.2 3.1 2.9 4.1 5.6*   GFR: Estimated Creatinine Clearance: 8.8 mL/min (A) (by C-G formula based on SCr of 5.73 mg/dL (H)). Liver Function Tests: Recent Labs  Lab 08/11/19 0805 08/12/19 0359 08/13/19 0331 08/14/19 0338 08/15/19 0355  ALBUMIN 2.8* 2.6* 2.7* 2.8* 3.0*   No results for input(s): LIPASE, AMYLASE in the last 168 hours. No results for input(s): AMMONIA in the last 168 hours. Coagulation Profile: No results for input(s): INR, PROTIME in the last 168 hours. Cardiac Enzymes: No results for input(s): CKTOTAL, CKMB, CKMBINDEX, TROPONINI in the last 168 hours. BNP (last 3 results) No results for input(s): PROBNP in the last 8760 hours. HbA1C: No results for input(s): HGBA1C in the last 72 hours. CBG: No results for input(s): GLUCAP in the last 168 hours. Lipid Profile: No results for input(s): CHOL, HDL, LDLCALC, TRIG, CHOLHDL, LDLDIRECT in the last 72 hours. Thyroid Function Tests: No results for input(s): TSH, T4TOTAL, FREET4, T3FREE, THYROIDAB in the last 72 hours. Anemia Panel: Recent Labs    08/14/19 1645  FERRITIN 1,389*  TIBC 175*  IRON 127  RETICCTPCT 9.2*   Sepsis Labs: No results for input(s): PROCALCITON, LATICACIDVEN in the last 168 hours.  Recent Results (from the past 240 hour(s))  Blood Culture (routine x 2)     Status: None   Collection Time: 08/05/19  2:31 PM   Specimen: BLOOD LEFT ARM  Result Value Ref Range Status   Specimen Description BLOOD LEFT ARM  Final   Special Requests   Final    BOTTLES DRAWN AEROBIC ONLY Blood Culture results may not be optimal due to an inadequate volume of blood received in culture bottles   Culture    Final    NO GROWTH 5 DAYS Performed at [facility], [place] [address], [place], [place] [address]    Report Status 08/10/2019 FINAL  Final  Urine culture     Status: None   Collection Time: 08/05/19  6:36 PM   Specimen: In/Out Cath Urine  Result Value Ref Range Status   Specimen Description IN/OUT CATH URINE  Final   Special Requests NONE  Final   Culture  Final    NO GROWTH Performed at [facility], [place] [address] [place], [place] [address]    Report Status 08/06/2019 FINAL  Final  SARS Coronavirus 2 by RT PCR (hospital order, performed in [facility] hospital lab) Nasopharyngeal Nasopharyngeal Swab     Status: None   Collection Time: 08/05/19  8:15 PM   Specimen: Nasopharyngeal Swab  Result Value Ref Range Status   SARS Coronavirus 2 NEGATIVE NEGATIVE Final    Comment: (NOTE) SARS-CoV-2 target nucleic acids are NOT DETECTED.  The SARS-CoV-2 RNA is generally detectable in upper and lower respiratory specimens during the acute phase of infection. The lowest concentration of SARS-CoV-2 viral copies this assay can detect is 250 copies / mL. A negative result does not preclude SARS-CoV-2 infection and should not be used as the sole basis for treatment or other patient management decisions.  A negative result may occur with improper specimen collection / handling, submission of specimen other than nasopharyngeal swab, presence of viral mutation(s) within the areas targeted by this assay, and inadequate number of viral copies (<250 copies / mL). A negative result must be combined with clinical observations, patient history, and epidemiological information.  Fact Sheet for Patients:   StrictlyIdeas.no  Fact Sheet for Healthcare Providers: BankingDealers.co.za  This test is not yet approved or  cleared by the [place] FDA and has been authorized for detection and/or diagnosis of SARS-CoV-2 by FDA under an Emergency  Use Authorization (EUA).  This EUA will remain in effect (meaning this test can be used) for the duration of the COVID-19 declaration under Section 564(b)(1) of the Act, 21 U.S.C. section 360bbb-3(b)(1), unless the authorization is terminated or revoked sooner.  Performed at [facility], [place] [address] [place], [place] [address]   MRSA PCR Screening     Status: None   Collection Time: 08/06/19 11:55 PM   Specimen: Nasal Mucosa; Nasopharyngeal  Result Value Ref Range Status   MRSA by PCR NEGATIVE NEGATIVE Final    Comment:        The GeneXpert MRSA Assay (FDA approved for NASAL specimens only), is one component of a comprehensive MRSA colonization surveillance program. It is not intended to diagnose MRSA infection nor to guide or monitor treatment for MRSA infections. Performed at [facility], [place] [address], [place], [place] [address]          Radiology Studies: No results found.      Scheduled Meds: . sodium chloride   Intravenous Once  . allopurinol  100 mg Oral Daily  . aspirin EC  81 mg Oral Daily  . Chlorhexidine Gluconate Cloth  6 each Topical Q0600  . colchicine  0.3 mg Oral Daily  . darbepoetin (ARANESP) injection - DIALYSIS  100 mcg Intravenous Q Tue-HD  . famotidine  20 mg Oral Daily  . feeding supplement (NEPRO CARB STEADY)  237 mL Oral BID BM  . feeding supplement (PRO-STAT SUGAR FREE 64)  30 mL Oral BID  . gabapentin  100 mg Oral BID  . guaiFENesin  600 mg Oral BID  . heparin  5,000 Units Subcutaneous Q12H  . latanoprost  1 drop Left Eye QHS  . midodrine      . midodrine  10 mg Oral BID WC  . multivitamin  1 tablet Oral QHS  . predniSONE  20 mg Oral BID WC  . tacrolimus  2 mg Oral QHS  . tacrolimus  3 mg Oral q morning - 10a  .  Vitamin D (Ergocalciferol)  50,000 Units Oral Q Sun   Continuous Infusions:    LOS: 10 days   Time spent: 29 mins  Darliss Cheney, MD [facility] P7/13/2021, 1:25 PM

## 2019-08-15 NOTE — Plan of Care (Signed)

## 2019-08-15 NOTE — Progress Notes (Signed)
Physical Therapy Treatment Patient Details Name: Tina Watkins MRN: [id number] DOB: April 13, 1965 Today's Date: 08/15/2019    History of Present Illness Patient is a 67-year female with history of ESRD status post renal transplant complicated by CKD stage IV on chronic steroids and immunosuppressive therapy, chronic anemia secondary to CKD, hypertension, TIA, chronic diastolic congestive heart failure, moderate pulmonary hypertension who presented with multiple joints pain which significantly limited her mobility.  Recently re-started on HD due to kidney failure, now with R femoral HD cath.    PT Comments    Patient received in bed, pleasant and excited to see PT- "I want to show you how far I can walk so I can maybe go home!". BP WNL throughout session. See below for physical assist/moblity levels. Generally able to mobilize with S-min guard during session but fatigues very quickly and easily, also continues to demonstrate considerable impairments in safety awareness as well as impaired perception of how much she is safely able to do on her own. Had very frank discussion with her at [facility]- she does report that if she were to go home, she would "be OK because I would lay in bed and only get up to eat or go to the bathroom"; explained that this pattern would lead to increased weakness and likelihood of fall, reduced mobility, and skin pressure sores  as well as no progression towards improving her strength, endurance, and safe independence. Able to come to agreement that she will benefit from rehab before return home. Left in bed with all needs met, bed alarm active this afternoon. Continue to recommend SNF.    Follow Up Recommendations  Supervision/Assistance - 24 hour;SNF     Equipment Recommendations  None recommended by PT (refusing WC)    Recommendations for Other Services       Precautions / Restrictions Precautions Precautions: Fall Precaution Comments: R tunneled femoral HD  cath (RN and pt report okay to sit/walk) Restrictions Weight Bearing Restrictions: No    Mobility  Bed Mobility Overal bed mobility: Needs Assistance Bed Mobility: Supine to Sit;Sit to Supine     Supine to sit: Supervision;HOB elevated Sit to supine: Supervision;HOB elevated   General bed mobility comments: S and HOB elevated halfway, able to get to/from EOB with extended time and increased effort  Transfers Overall transfer level: Needs assistance Equipment used: Rolling walker (2 wheeled) Transfers: Sit to/from Stand Sit to Stand: Min guard         General transfer comment: min guard to come to full standing position with extended time, needed cues for hand placement/general safety, difficulty with hip extension  Ambulation/Gait Ambulation/Gait assistance: Min guard Gait Distance (Feet): 60 Feet Assistive device: Rolling walker (2 wheeled) Gait Pattern/deviations: Step-to pattern;Antalgic;Trunk flexed Gait velocity: decr   General Gait Details: short antalgic step lengths and very slow gait pattern; heavy trunk flexion and easily fatigued/with unrealistic expectations of what she will be able to do, had to have staff in hallway bring recliner up behind her as she could not make it back to her room secondary to fatigue   Stairs             Wheelchair Mobility    Modified Rankin (Stroke Patients Only)       Balance Overall balance assessment: Needs assistance Sitting-balance support: Feet supported;Bilateral upper extremity supported Sitting balance-Leahy Scale: Good Sitting balance - Comments: statically stable, did not challenge seated dynamic balance   Standing balance support: Bilateral upper extremity supported;During functional activity Standing balance-Leahy Scale:  Poor Standing balance comment: heavy reliance on BUE support                            Cognition Arousal/Alertness: Awake/alert Behavior During Therapy: WFL for tasks  assessed/performed Overall Cognitive Status: Impaired/Different from baseline                     Current Attention Level: Selective   Following Commands: Follows one step commands consistently;Follows one step commands with increased time Safety/Judgement: Decreased awareness of safety;Decreased awareness of deficits Awareness: Intellectual Problem Solving: Slow processing;Requires verbal cues General Comments: physically improving but lacking insight into deficts- expecting herself to be able to do more than is necessarily safe right now. Cues for safety during mobility      Exercises      General Comments        Pertinent Vitals/Pain Pain Assessment: Faces Faces Pain Scale: Hurts little more Pain Location: bilateral feet R>L, R knee 2* gout flare Pain Descriptors / Indicators: Discomfort;Sore Pain Intervention(s): Limited activity within patient's tolerance;Monitored during session    Home Living                      Prior Function            PT Goals (current goals can now be found in the care plan section) Acute Rehab PT Goals Patient Stated Goal: go home PT Goal Formulation: With patient Time For Goal Achievement: 08/20/19 Potential to Achieve Goals: Fair Progress towards PT goals: Progressing toward goals    Frequency    Min 3X/week      PT Plan Frequency needs to be updated    Co-evaluation              AM-PAC PT "6 Clicks" Mobility   Outcome Measure  Help needed turning from your back to your side while in a flat bed without using bedrails?: A Little Help needed moving from lying on your back to sitting on the side of a flat bed without using bedrails?: A Little Help needed moving to and from a bed to a chair (including a wheelchair)?: A Little Help needed standing up from a chair using your arms (e.g., wheelchair or bedside chair)?: A Little Help needed to walk in hospital room?: A Little Help needed climbing 3-5 steps with  a railing? : A Lot 6 Click Score: 17    End of Session Equipment Utilized During Treatment: Gait belt Activity Tolerance: Patient tolerated treatment well;Patient limited by fatigue Patient left: in bed;with call bell/phone within reach;with bed alarm set Nurse Communication: Mobility status PT Visit Diagnosis: Unsteadiness on feet (R26.81);Pain;Muscle weakness (generalized) (M62.81) Pain - Right/Left: Right Pain - part of body: Ankle and joints of foot     Time: 1339-1419 PT Time Calculation (min) (ACUTE ONLY): 40 min  Charges:  $Gait Training: 8-22 mins $Therapeutic Activity: 8-22 mins $Self Care/Home Management: 8-22                     Windell Norfolk, DPT, PN1   Supplemental Physical Therapist [place]    Pager [phone] Acute Rehab Office [phone]

## 2019-08-15 NOTE — Progress Notes (Signed)
PT Cancellation Note  Patient Details Name: Katharine Rochefort MRN: [id number] DOB: [birth year]   Cancelled Treatment:    Reason Eval/Treat Not Completed: Patient at procedure or test/unavailable At HD/unavailable for PT. Will attempt to return later if time/schedule allow.   Windell Norfolk, DPT, PN1   Supplemental Physical Therapist [facility]    Pager [phone] Acute Rehab Office [phone]

## 2019-08-15 NOTE — TOC Progression Note (Signed)
Transition of Care [facility]) - Progression Note    Patient Details  Name: Tina Watkins MRN: [id number] Date of Birth: 05-Aug-1965  [facility] CM/SW Contact  Angelita Ingles, RN Phone Number: [phone]  08/15/2019, 2:42 PM  Clinical Narrative:    CM notified to order DME and follow up for patients discharge disposition due to patient feels that she is strong enough to go home and care for self. PT has reevaluated and determined that CM should continue with bed search. Currently no bed offers. CM will follow up with facilities.         Expected Discharge Plan and Services           Expected Discharge Date: 08/08/19                                     Social Determinants of Health (SDOH) Interventions    Readmission Risk Interventions Readmission Risk Prevention Plan 05/18/2019  Transportation Screening Complete  PCP or Specialist Appt within 3-5 Days Complete  HRI or [place] (No Data)  Medication Review [employer]) Complete  Some recent data might be hidden

## 2019-08-15 NOTE — Progress Notes (Addendum)
[facility] Progress Note  Subjective: pt seen in HD, no new c/o's.   Vitals:   08/15/19 1030 08/15/19 1100 08/15/19 1140 08/15/19 1150  BP: (!) 97/49 (!) 88/47 (!) 97/45 98/75  Pulse: 61 67 69 60  Resp: 14 20 20 20   Temp:    98.8 F (37.1 C)  TempSrc:    Axillary  SpO2:   98%   Weight:   55.5 kg   Height:        Exam: General:NAD, comfortable. Heart:RRR, s1s2 nl, no rubs Lungs: rales/ rhonchi R base, L clear Abdomen:soft, Non-tender, non-distended Extremities: bilat thigh edema/ R breast 2+  R hand erythema/ edema much better Dialysis Access: Right femoral HD catheter    OP HD: TTS [facility]    4h  57.5kg  2/2 bath  Hep 1000  R fem TDC  mircera 100 q2 last 6/24  Assessment/ Plan: 1. HCAP- suspected LLL PNA, treated with IV vancomycin and Zosyn.  Clinically improved. Completed abx course. 2. Acute gout flare: On colchicine and steroid, pred ^'d 20 bid for pain. Better. Have restarted allopurinol since pt is now off of Imuran.  3. ESRD / failed renal Tx: recently resumed HD in May 2021. TTS HD. 4. Hypotension/Vol overload: Chronic hypotension therefore on midodrine. Is 2 kg under but was down to 6 kg under at 51-52 kg which should be our goal.  Has diffuse hip edema/ breast edema, can pull fluid in the mid-high 70's and 80's. Unfortunately due to miscommunication we only got 1kg off today.  Nonetheless, this issue should not keep her in the hospital and she is ready for dc from renal standpoint.  5. 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 Trough prograf pending from 7/12. Imuran was stopped in June d/t leukopenia, will keep this off.  6. Anemia of ckd: Started Aranesp.  No iron because of infection. 7. MBD ckd: Calcium phosphorus okay.  Monitor lab. 8. H/o COVID infection - prior prolonged COVID admit in April. COVID negative on admission here.  9. Deconditioning - awaiting SNF placement   Rob Hebe Merriwether 08/15/2019, 3:57  PM   Recent Labs  Lab 08/14/19 0338 08/14/19 0338 08/14/19 1645 08/15/19 0355  K 4.4  --   --  4.7  BUN 60*  --   --  75*  CREATININE 4.60*  --   --  5.73*  CALCIUM 9.0  --   --  9.4  PHOS 4.1  --   --  5.6*  HGB 6.9*   < > 8.4* 8.7*   < > = values in this interval not displayed.   Inpatient medications: . sodium chloride   Intravenous Once  . allopurinol  100 mg Oral Daily  . aspirin EC  81 mg Oral Daily  . Chlorhexidine Gluconate Cloth  6 each Topical Q0600  . colchicine  0.3 mg Oral Daily  . darbepoetin (ARANESP) injection - DIALYSIS  100 mcg Intravenous Q Tue-HD  . famotidine  20 mg Oral Daily  . feeding supplement (NEPRO CARB STEADY)  237 mL Oral BID BM  . feeding supplement (PRO-STAT SUGAR FREE 64)  30 mL Oral BID  . gabapentin  100 mg Oral BID  . guaiFENesin  600 mg Oral BID  . heparin  5,000 Units Subcutaneous Q12H  . latanoprost  1 drop Left Eye QHS  . midodrine      . midodrine  10 mg Oral BID WC  . multivitamin  1 tablet Oral  QHS  . predniSONE  20 mg Oral BID WC  . tacrolimus  2 mg Oral QHS  . tacrolimus  3 mg Oral q morning - 10a  . Vitamin D (Ergocalciferol)  50,000 Units Oral Q Sun    albuterol, albuterol, diphenhydrAMINE, fluticasone, HYDROmorphone, lidocaine, nystatin, ondansetron, zolpidem

## 2019-08-16 LAB — CBC WITH DIFFERENTIAL/PLATELET
Abs Immature Granulocytes: 0.5 10*3/uL — ABNORMAL HIGH (ref 0.00–0.07)
Basophils Absolute: 0 10*3/uL (ref 0.0–0.1)
Basophils Relative: 1 %
Eosinophils Absolute: 0 10*3/uL (ref 0.0–0.5)
Eosinophils Relative: 0 %
HCT: 26.8 % — ABNORMAL LOW (ref 36.0–46.0)
Hemoglobin: 8.2 g/dL — ABNORMAL LOW (ref 12.0–15.0)
Immature Granulocytes: 6 %
Lymphocytes Relative: 4 %
Lymphs Abs: 0.3 10*3/uL — ABNORMAL LOW (ref 0.7–4.0)
MCH: 31.5 pg (ref 26.0–34.0)
MCHC: 30.6 g/dL (ref 30.0–36.0)
MCV: 103.1 fL — ABNORMAL HIGH (ref 80.0–100.0)
Monocytes Absolute: 0.4 10*3/uL (ref 0.1–1.0)
Monocytes Relative: 5 %
Neutro Abs: 6.6 10*3/uL (ref 1.7–7.7)
Neutrophils Relative %: 84 %
Platelets: 108 10*3/uL — ABNORMAL LOW (ref 150–400)
RBC: 2.6 MIL/uL — ABNORMAL LOW (ref 3.87–5.11)
RDW: 25.9 % — ABNORMAL HIGH (ref 11.5–15.5)
WBC: 7.8 10*3/uL (ref 4.0–10.5)
nRBC: 2.8 % — ABNORMAL HIGH (ref 0.0–0.2)

## 2019-08-16 LAB — RENAL FUNCTION PANEL
Albumin: 2.8 g/dL — ABNORMAL LOW (ref 3.5–5.0)
Anion gap: 16 — ABNORMAL HIGH (ref 5–15)
BUN: 44 mg/dL — ABNORMAL HIGH (ref 6–20)
CO2: 20 mmol/L — ABNORMAL LOW (ref 22–32)
Calcium: 8.6 mg/dL — ABNORMAL LOW (ref 8.9–10.3)
Chloride: 100 mmol/L (ref 98–111)
Creatinine, Ser: 3.86 mg/dL — ABNORMAL HIGH (ref 0.44–1.00)
GFR calc Af Amer: 15 mL/min — ABNORMAL LOW (ref >60)
GFR calc non Af Amer: 13 mL/min — ABNORMAL LOW (ref >60)
Glucose, Bld: 119 mg/dL — ABNORMAL HIGH (ref 70–99)
Phosphorus: 4.9 mg/dL — ABNORMAL HIGH (ref 2.5–4.6)
Potassium: 4 mmol/L (ref 3.5–5.1)
Sodium: 136 mmol/L (ref 135–145)

## 2019-08-16 LAB — TACROLIMUS LEVEL: Tacrolimus (FK506) - LabCorp: 21.3 ng/mL — ABNORMAL HIGH (ref 2.0–20.0)

## 2019-08-16 MED ORDER — PREDNISONE 10 MG PO TABS
10.0000 mg | ORAL_TABLET | Freq: Once | ORAL | Status: AC
  Administered 2019-08-16: 10 mg via ORAL
  Filled 2019-08-16: qty 1

## 2019-08-16 MED ORDER — PREDNISONE 20 MG PO TABS
20.0000 mg | ORAL_TABLET | Freq: Every day | ORAL | Status: AC
  Administered 2019-08-19 – 2019-08-21 (×3): 20 mg via ORAL
  Filled 2019-08-16 (×4): qty 1

## 2019-08-16 MED ORDER — PREDNISONE 20 MG PO TABS
30.0000 mg | ORAL_TABLET | Freq: Every day | ORAL | Status: AC
  Administered 2019-08-17 – 2019-08-18 (×2): 30 mg via ORAL
  Filled 2019-08-16 (×2): qty 1

## 2019-08-16 NOTE — Progress Notes (Signed)
[facility] Progress Note  Subjective: pt seen in room, no new c/o  Vitals:   08/15/19 1629 08/16/19 0316 08/16/19 0500 08/16/19 0755  BP: (!) 100/48 115/65  110/65  Pulse: 65 (!) 53  (!) 54  Resp:      Temp: 98.5 F (36.9 C) 98.5 F (36.9 C)  98 F (36.7 C)  TempSrc:      SpO2: 100% 100%  100%  Weight:   53.9 kg   Height:        Exam: General:NAD, comfortable. Heart:RRR, s1s2 nl, no rubs Lungs: rales/ rhonchi R base, L clear Abdomen:soft, Non-tender, non-distended Extremities: bilat thigh edema/ R breast 2+  R hand erythema much better Dialysis Access: Right femoral HD catheter    OP HD: TTS [facility]    4h  57.5kg  2/2 bath  Hep 1000  R fem TDC  mircera 100 q2 last 6/24  Assessment/ Plan: 1. HCAP- suspected LLL PNA, treated with IV vancomycin and Zosyn.  Clinically improved. Completed abx course. 2. Acute gout flare: On colchicine and steroid, pred ^'d 20 bid for pain. Better. Have restarted allopurinol since pt is now off of Imuran.  3. ESRD / failed renal Tx: recently resumed HD in May 2021. TTS HD. 4. Hypotension/Vol overload: Chronic hypotension therefore on midodrine. Is 3kg under but should be around 51-52 kg.  Has diffuse hip edema/ breast edema, can pull fluid in the mid-high 70's -80's. However, this is a long-term issue and should not keep her in the hospital. She is stable for dc from renal standpoint.  5. 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 Trough prograf pending from 7/12. Imuran was stopped in June d/t leukopenia, will keep this off.  6. Anemia of ckd: Started Aranesp.  No iron because of infection. 7. MBD ckd: Calcium phosphorus okay.  Monitor lab. 8. H/o COVID infection - prior prolonged COVID admit in April. COVID negative on admission here.  9. Deconditioning - awaiting SNF placement   Rob Tally Mckinnon 08/16/2019, 2:27 PM   Recent Labs  Lab 08/15/19 0355 08/16/19 0453  K 4.7 4.0  BUN 75* 44*   CREATININE 5.73* 3.86*  CALCIUM 9.4 8.6*  PHOS 5.6* 4.9*  HGB 8.7* 8.2*   Inpatient medications: . sodium chloride   Intravenous Once  . allopurinol  100 mg Oral Daily  . aspirin EC  81 mg Oral Daily  . Chlorhexidine Gluconate Cloth  6 each Topical Q0600  . colchicine  0.3 mg Oral Daily  . darbepoetin (ARANESP) injection - DIALYSIS  100 mcg Intravenous Q Tue-HD  . famotidine  20 mg Oral Daily  . feeding supplement (NEPRO CARB STEADY)  237 mL Oral BID BM  . feeding supplement (PRO-STAT SUGAR FREE 64)  30 mL Oral BID  . gabapentin  100 mg Oral BID  . guaiFENesin  600 mg Oral BID  . heparin  5,000 Units Subcutaneous Q12H  . latanoprost  1 drop Left Eye QHS  . midodrine  10 mg Oral BID WC  . multivitamin  1 tablet Oral QHS  . predniSONE  10 mg Oral Once  . [START ON 08/17/2019] predniSONE  30 mg Oral Q breakfast   Followed by  . [START ON 08/19/2019] predniSONE  20 mg Oral Q breakfast  . tacrolimus  2 mg Oral QHS  . tacrolimus  3 mg Oral q morning - 10a  . Vitamin D (Ergocalciferol)  50,000 Units Oral Q Sun    albuterol, albuterol,  diphenhydrAMINE, fluticasone, HYDROmorphone, lidocaine, nystatin, ondansetron, zolpidem

## 2019-08-16 NOTE — Progress Notes (Signed)
Physical Therapy Treatment Patient Details Name: Tina Watkins MRN: [id number] DOB: [birth year] Today's Date: 08/16/2019    History of Present Illness Patient is a 57-year female with history of ESRD status post renal transplant complicated by CKD stage IV on chronic steroids and immunosuppressive therapy, chronic anemia secondary to CKD, hypertension, TIA, chronic diastolic congestive heart failure, moderate pulmonary hypertension who presented with multiple joints pain which significantly limited her mobility.  Recently re-started on HD due to kidney failure, now with R femoral HD cath.    PT Comments    Patient received in bed, pleasant and excited to work with therapies and not having any pain today. See below for mobility/assist levels. Able to progress gait distance but needed cues to maintain distance within reasonable limits so that we could return back to her room- still with impaired perception of overall deficits. Re-introduced stair training and able to ascend/descend 6 steps with U rail but did need MinA for balance mostly with ascent, faded more to min guard with descent. Able to perform stand pivots to/from [facility] with no device and close min guard today as well. Left sitting at EOB with all needs met this morning. Making progress but would continue to benefit from at least a short rehab stay prior to return home alone.    Follow Up Recommendations  Supervision/Assistance - 24 hour;SNF     Equipment Recommendations  None recommended by PT (refusing WC)    Recommendations for Other Services       Precautions / Restrictions Precautions Precautions: Fall Precaution Comments: R tunneled femoral HD cath (RN and pt report okay to sit/walk) Restrictions Weight Bearing Restrictions: No    Mobility  Bed Mobility Overal bed mobility: Needs Assistance Bed Mobility: Supine to Sit     Supine to sit: Supervision;HOB elevated     General bed mobility comments: S and  extended time, increased effort  Transfers Overall transfer level: Needs assistance Equipment used: Rolling walker (2 wheeled) Transfers: Sit to/from [facility] Sit to Stand: Supervision Stand pivot transfers: Supervision       General transfer comment: S for all transfers today with and without RW- just very slow and needed extended time  Ambulation/Gait Ambulation/Gait assistance: Supervision Gait Distance (Feet): 100 Feet Assistive device: Rolling walker (2 wheeled) Gait Pattern/deviations: Step-through pattern;Trunk flexed;Decreased stride length;Decreased step length - left;Decreased step length - right Gait velocity: decr   General Gait Details: much less antalgic today but remains easily fatigued- needed cues to keep gait distance within appropriate distance without getting stranded in the hallway due to fatigue   Stairs Stairs: Yes Stairs assistance: Min assist Stair Management: One rail Right Number of Stairs: 6 General stair comments: use of U rail on either side (alternating sides- has 2 rails at home but cannot reach them both at the same time), step to pattern with MinA for steadying and balance, VC for technique   Wheelchair Mobility    Modified Rankin (Stroke Patients Only)       Balance Overall balance assessment: Needs assistance Sitting-balance support: Feet unsupported;Bilateral upper extremity supported Sitting balance-Leahy Scale: Good     Standing balance support: During functional activity;No upper extremity supported Standing balance-Leahy Scale: Good Standing balance comment: min guard for balance without BUE support                            Cognition Arousal/Alertness: Awake/alert Behavior During Therapy: WFL for tasks assessed/performed Overall Cognitive Status:  Within Functional Limits for tasks assessed                     Current Attention Level: Selective   Following Commands: Follows one  step commands consistently;Follows one step commands with increased time;Follows multi-step commands with increased time;Follows multi-step commands consistently   Awareness: Emergent Problem Solving: Slow processing;Requires verbal cues General Comments: cognition continuing to improve- but does still need cues for safety and hand placement during transfers      Exercises      General Comments        Pertinent Vitals/Pain Pain Assessment: No/denies pain Pain Score: 0-No pain Faces Pain Scale: No hurt    Home Living                      Prior Function            PT Goals (current goals can now be found in the care plan section) Acute Rehab PT Goals Patient Stated Goal: go home PT Goal Formulation: With patient Time For Goal Achievement: 08/20/19 Potential to Achieve Goals: Fair Progress towards PT goals: Progressing toward goals    Frequency    Min 3X/week      PT Plan Current plan remains appropriate    Co-evaluation              AM-PAC PT "6 Clicks" Mobility   Outcome Measure  Help needed turning from your back to your side while in a flat bed without using bedrails?: A Little Help needed moving from lying on your back to sitting on the side of a flat bed without using bedrails?: A Little Help needed moving to and from a bed to a chair (including a wheelchair)?: A Little Help needed standing up from a chair using your arms (e.g., wheelchair or bedside chair)?: A Little Help needed to walk in hospital room?: A Little Help needed climbing 3-5 steps with a railing? : A Lot 6 Click Score: 17    End of Session Equipment Utilized During Treatment: Gait belt Activity Tolerance: Patient tolerated treatment well Patient left: in bed;with call bell/phone within reach (sitting at EOB)   PT Visit Diagnosis: Unsteadiness on feet (R26.81);Pain;Muscle weakness (generalized) (M62.81) Pain - Right/Left: Right Pain - part of body: Ankle and joints of  foot     Time: 9597-4718 PT Time Calculation (min) (ACUTE ONLY): 41 min  Charges:  $Gait Training: 23-37 mins $Therapeutic Activity: 8-22 mins                     Windell Norfolk, DPT, PN1   Supplemental Physical Therapist [place]    Pager [phone] Acute Rehab Office [phone]

## 2019-08-16 NOTE — Plan of Care (Signed)

## 2019-08-16 NOTE — TOC Progression Note (Signed)
Transition of Care [facility]) - Progression Note    Patient Details  Name: Tina Watkins MRN: [id number] Date of Birth: 08-09-1965  [facility] CM/SW Contact  Angelita Ingles, RN Phone Number: [phone]  08/16/2019, 1:38 PM  Clinical Narrative:    Referrals for snf bed have been updated to ask SNF with pending notifications to review patients info to determine if facility can make a bed offer. Offers still pending. CM will continue to follow up.         Expected Discharge Plan and Services           Expected Discharge Date: 08/08/19                                     Social Determinants of Health (SDOH) Interventions    Readmission Risk Interventions Readmission Risk Prevention Plan 05/18/2019  Transportation Screening Complete  PCP or Specialist Appt within 3-5 Days Complete  HRI or [place] (No Data)  Medication Review [employer] photographer) Complete  Some recent data might be hidden

## 2019-08-16 NOTE — TOC Progression Note (Signed)
Transition of Care [facility]) - Progression Note    Patient Details  Name: Tina Watkins MRN: [id number] Date of Birth: [birth year]  Transition of [facility]) CM/SW Contact  Angelita Ingles, RN Phone Number: [phone]  08/16/2019, 2:50 PM  Clinical Narrative:    Patient asked to speak with CM. CM at bedside and patient states that she just spoke with [facility] and rehab and was told that they had not received information from CM in regards to bed offer and that the facility does have beds avaliable. CM reviewed the entire list of all facilities that info has been faxed out to including all facilities that have declined bed request. [facility] declined patient. Patient called [facility] while CM was at the bedside. [facility] states that they don't have any medicaid beds right now and that they cant see the patients information probably because they already declined the offer. CM has informed the patient that we will continue to work placement. CM will continue to follow.        Expected Discharge Plan and Services           Expected Discharge Date: 08/08/19                                     Social Determinants of Health (SDOH) Interventions    Readmission Risk Interventions Readmission Risk Prevention Plan 05/18/2019  Transportation Screening Complete  PCP or Specialist Appt within 3-5 Days Complete  HRI or [place] (No Data)  Medication Review Press photographer) Complete  Some recent data might be hidden

## 2019-08-16 NOTE — Progress Notes (Signed)
PROGRESS NOTE    Tina Watkins  NFA:[id number] DOB: [birth year] DOA: 08/05/2019 PCP: [name], FNP   Brief Narrative: Patient is a 42-year female with history of ESRD status post renal transplant complicated by CKD stage IV on chronic steroids and immunosuppressive therapy, chronic anemia secondary to CKD, hypertension, TIA, chronic diastolic congestive heart failure, moderate pulmonary hypertension who presented with multiple joints pain which significantly limited her mobility. Symptoms started about a week ago. It involved right wrist and several fingers and right elbow and bilateral knees and ankles. She also had low-grade fever. Patient also developed productive cough with yellow sputum. She was started on hemodialysis this June because of failed kidney transplant.She has a right-sided femoral HD catheter for dialysis. On presentation, chest x-ray showed possible left lower lobe pneumonia. Admitted for further management.Startedon IV antibiotics and being also managed for acute gout flare.  Hospital course remained stable.  She was seen by PT and recommended home health on discharge.  She has chronic pain due to her gouty arthritis.  Her respiratory status is stable and she is maintaining her saturation on room air.  PT recommended SNF. Pending placement  Assessment & Plan:   Active Problems:   HCAP (healthcare-associated pneumonia)  Healthcare associated pneumonia: Presented with cough, new infiltrate on the chest x-ray.Leukocytosis.started on vancomycin and Zosyn.Elevated procalcitonin. Presented with fever. Since MRSA PCR is negative, vancomycin stopped.  Afebrile now.  Cultures have remained negative.  Her respiratory status is stable.  Currently on room air.  Antibiotics changed to oral Augmentin which she completed course on 08/11/2019.  ESRD on hemodialysis/failed renal transplant x3:Nephrology following. She has right-sided femoral dialysis  catheter. She has extremely limited IV access on the upper extremities. She is following with vascular surgery as an outpatient for permanent access. Patient still produces urine.    She gets dialyzed on TTS schedule.  Acute gouty flareup: Presented with pain on the multiple joints, more pronounced in bilateral ankle and right thumb.  Colchicine restarted due to severe pain. Dose of steroid has been increased from her baseline of 5 mg p.o. daily to 20 mg p.o. daily which was increased to twice daily on 08/12/2019 and patient continues to feel better. Her ankle pain and right thumb pain is improved. Her right thumb is back to normal and is nontender.  We will start to taper prednisone starting today with at the dose of 30 mg p.o. daily for 3 days followed by 20 mg 3 days then 10 mg 3 days.  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 Her nephrologist is still recommending to continue steroid and tacrolimus. Imuran was discontinued last month due to worsening pancytopenia.  Chronic  hypotension:On midodrine. BP soft. She has chronic hypotension.  Leukocytosis:Leukocytosis most likely associated with her acute gout flareup. resolved  Anemia of chronic disease/thrombocytopenia: Chronic microcytosis.  B12 and folate were normal.  Could be due to kidney disease.  Hemoglobin dropped to 6.9 today.  1 unit of PRBC transfusion ordered by night physician. Will check FOBT, reticulocyte, ferritin, TIBC and iron.  Chronic diastolic heart failure: Volume managed by dialysis. Last echo on 12/20 showed restrictive cardiomyopathy, grade 3 diastolic dysfunction, preserved ejection fraction, moderate pulmonary hypertension  History of Covid infection:On April 2019. Presented with Covid pneumonia and had prolonged hospitalization.  Debility/deconditioning: She is severely deconditioned due to her debilitating chronic gouty arthritis. PT recommended skilled nursing  facility on discharge.  Patient was confident that she has progressed and now  she might be able to go home however upon reevaluation by PT yesterday, they still believe that patient will benefit from SNF.  TOC working on placement.   Nutrition Problem: Increased nutrient needs Etiology: chronic illness (ESRD on HD, CHF)    DVT prophylaxis: Heparin subcu Code Status: Full code Family Communication: Discussed with patient  status is: Inpatient  Remains inpatient appropriate because:unsafe DC plan  Dispo: The patient is from: Home              Anticipated d/c is to: SNF vs home with home health depending on how quickly patient progresses              Anticipated d/c date is: Unknown.  Depends on bed availability.              Patient currently is medically stable for discharge  Consultants: Nephrology  Procedures: Hemodialysis  Antimicrobials:  Anti-infectives (From admission, onward)   Start     Dose/Rate Route Frequency Ordered Stop   08/08/19 1200  vancomycin (VANCOCIN) IVPB 500 mg/100 ml premix  Status:  Discontinued        500 mg 100 mL/hr over 60 Minutes Intravenous Every T-Th-Sa (Hemodialysis) 08/05/19 1522 08/07/19 0748   08/08/19 1200  amoxicillin-clavulanate (AUGMENTIN) 500-125 MG per tablet 500 mg        1 tablet Oral 2 times daily 08/08/19 1101 08/11/19 1900   08/08/19 0000  amoxicillin-clavulanate (AUGMENTIN) 500-125 MG tablet  Status:  Discontinued        1 tablet Oral 2 times daily 08/08/19 1233 08/11/19    08/05/19 2030  piperacillin-tazobactam (ZOSYN) IVPB 2.25 g  Status:  Discontinued        2.25 g 100 mL/hr over 30 Minutes Intravenous Every 8 hours 08/05/19 1522 08/08/19 1101   08/05/19 1230  vancomycin (VANCOCIN) IVPB 1000 mg/200 mL premix        1,000 mg 200 mL/hr over 60 Minutes Intravenous  Once 08/05/19 1219 08/05/19 1412   08/05/19 1230  piperacillin-tazobactam (ZOSYN) IVPB 3.375 g        3.375 g 100 mL/hr over 30 Minutes Intravenous  Once 08/05/19 1219  08/05/19 1328      Subjective:  Patient seen and examined in her room.  She has no complaints.  She is happy that her ankle pain and thumb pain is resolved.  When I entered the room, she was making several phone calls to different SNF facilities to find a bed availability for herself.  Objective: Vitals:   08/15/19 1629 08/16/19 0316 08/16/19 0500 08/16/19 0755  BP: (!) 100/48 115/65  110/65  Pulse: 65 (!) 53  (!) 54  Resp:      Temp: 98.5 F (36.9 C) 98.5 F (36.9 C)  98 F (36.7 C)  TempSrc:      SpO2: 100% 100%  100%  Weight:   53.9 kg   Height:       No intake or output data in the 24 hours ending 08/16/19 1425 Filed Weights   08/15/19 0735 08/15/19 1140 08/16/19 0500  Weight: 56.5 kg 55.5 kg 53.9 kg    Examination:  General exam: Appears calm and comfortable  Respiratory system: Clear to auscultation. Respiratory effort normal. Cardiovascular system: S1 & S2 heard, RRR. No JVD, murmurs, rubs, gallops or clicks. No pedal edema. Gastrointestinal system: Abdomen is nondistended, soft and nontender. No organomegaly or masses felt. Normal bowel sounds heard. Central nervous system: Alert and oriented. No focal neurological deficits. Extremities: Symmetric 5  x 5 power. Skin: No rashes, lesions or ulcers.  Psychiatry: Judgement and insight appear normal. Mood & affect appropriate.   Data Reviewed: I have personally reviewed following labs and imaging studies  CBC: Recent Labs  Lab 08/12/19 1302 08/12/19 1302 08/13/19 1043 08/14/19 0338 08/14/19 1645 08/15/19 0355 08/16/19 0453  WBC 8.5  --  9.5 7.5  --  8.0 7.8  NEUTROABS  --   --  7.2 6.3  --  6.9 6.6  HGB 7.4*   < > 8.0* 6.9* 8.4* 8.7* 8.2*  HCT 23.6*   < > 25.6* 21.9* 26.8* 26.7* 26.8*  MCV 105.4*  --  107.1* 107.4*  --  100.4* 103.1*  PLT 121*  --  131* 130*  --  130* 108*   < > = values in this interval not displayed.   Basic Metabolic Panel: Recent Labs  Lab 08/12/19 0359 08/13/19 0331  08/14/19 0338 08/15/19 0355 08/16/19 0453  NA 138 138 139 137 136  K 3.8 5.0 4.4 4.7 4.0  CL 101 105 102 98 100  CO2 25 22 25 24  20*  GLUCOSE 104* 107* 113* 112* 119*  BUN 36* 40* 60* 75* 44*  CREATININE 4.53* 3.16* 4.60* 5.73* 3.86*  CALCIUM 8.8* 8.4* 9.0 9.4 8.6*  PHOS 3.1 2.9 4.1 5.6* 4.9*   GFR: Estimated Creatinine Clearance: 12.9 mL/min (A) (by C-G formula based on SCr of 3.86 mg/dL (H)). Liver Function Tests: Recent Labs  Lab 08/12/19 0359 08/13/19 0331 08/14/19 0338 08/15/19 0355 08/16/19 0453  ALBUMIN 2.6* 2.7* 2.8* 3.0* 2.8*   No results for input(s): LIPASE, AMYLASE in the last 168 hours. No results for input(s): AMMONIA in the last 168 hours. Coagulation Profile: No results for input(s): INR, PROTIME in the last 168 hours. Cardiac Enzymes: No results for input(s): CKTOTAL, CKMB, CKMBINDEX, TROPONINI in the last 168 hours. BNP (last 3 results) No results for input(s): PROBNP in the last 8760 hours. HbA1C: No results for input(s): HGBA1C in the last 72 hours. CBG: No results for input(s): GLUCAP in the last 168 hours. Lipid Profile: No results for input(s): CHOL, HDL, LDLCALC, TRIG, CHOLHDL, LDLDIRECT in the last 72 hours. Thyroid Function Tests: No results for input(s): TSH, T4TOTAL, FREET4, T3FREE, THYROIDAB in the last 72 hours. Anemia Panel: Recent Labs    08/14/19 1645  FERRITIN 1,389*  TIBC 175*  IRON 127  RETICCTPCT 9.2*   Sepsis Labs: No results for input(s): PROCALCITON, LATICACIDVEN in the last 168 hours.  Recent Results (from the past 240 hour(s))  MRSA PCR Screening     Status: None   Collection Time: 08/06/19 11:55 PM   Specimen: Nasal Mucosa; Nasopharyngeal  Result Value Ref Range Status   MRSA by PCR NEGATIVE NEGATIVE Final    Comment:        The GeneXpert MRSA Assay (FDA approved for NASAL specimens only), is one component of a comprehensive MRSA colonization surveillance program. It is not intended to diagnose  MRSA infection nor to guide or monitor treatment for MRSA infections. Performed at [facility], [place] [address] [place], [place] [address]          Radiology Studies: No results found.      Scheduled Meds: . sodium chloride   Intravenous Once  . allopurinol  100 mg Oral Daily  . aspirin EC  81 mg Oral Daily  . Chlorhexidine Gluconate Cloth  6 each Topical Q0600  . colchicine  0.3 mg Oral Daily  . darbepoetin (ARANESP) injection -  DIALYSIS  100 mcg Intravenous Q Tue-HD  . famotidine  20 mg Oral Daily  . feeding supplement (NEPRO CARB STEADY)  237 mL Oral BID BM  . feeding supplement (PRO-STAT SUGAR FREE 64)  30 mL Oral BID  . gabapentin  100 mg Oral BID  . guaiFENesin  600 mg Oral BID  . heparin  5,000 Units Subcutaneous Q12H  . latanoprost  1 drop Left Eye QHS  . midodrine  10 mg Oral BID WC  . multivitamin  1 tablet Oral QHS  . predniSONE  10 mg Oral Once  . [START ON 08/17/2019] predniSONE  30 mg Oral Q breakfast   Followed by  . [START ON 08/19/2019] predniSONE  20 mg Oral Q breakfast  . tacrolimus  2 mg Oral QHS  . tacrolimus  3 mg Oral q morning - 10a  . Vitamin D (Ergocalciferol)  50,000 Units Oral Q Sun   Continuous Infusions:    LOS: 11 days   Time spent: 27 mins  Darliss Cheney, MD [facility] P7/14/2021, 2:25 PM

## 2019-08-17 LAB — RENAL FUNCTION PANEL
Albumin: 2.9 g/dL — ABNORMAL LOW (ref 3.5–5.0)
Anion gap: 17 — ABNORMAL HIGH (ref 5–15)
BUN: 62 mg/dL — ABNORMAL HIGH (ref 6–20)
CO2: 20 mmol/L — ABNORMAL LOW (ref 22–32)
Calcium: 8.9 mg/dL (ref 8.9–10.3)
Chloride: 98 mmol/L (ref 98–111)
Creatinine, Ser: 4.97 mg/dL — ABNORMAL HIGH (ref 0.44–1.00)
GFR calc Af Amer: 11 mL/min — ABNORMAL LOW (ref >60)
GFR calc non Af Amer: 9 mL/min — ABNORMAL LOW (ref >60)
Glucose, Bld: 113 mg/dL — ABNORMAL HIGH (ref 70–99)
Phosphorus: 5.6 mg/dL — ABNORMAL HIGH (ref 2.5–4.6)
Potassium: 4.1 mmol/L (ref 3.5–5.1)
Sodium: 135 mmol/L (ref 135–145)

## 2019-08-17 LAB — CBC WITH DIFFERENTIAL/PLATELET
Abs Immature Granulocytes: 0.57 10*3/uL — ABNORMAL HIGH (ref 0.00–0.07)
Basophils Absolute: 0 10*3/uL (ref 0.0–0.1)
Basophils Relative: 0 %
Eosinophils Absolute: 0 10*3/uL (ref 0.0–0.5)
Eosinophils Relative: 0 %
HCT: 27.6 % — ABNORMAL LOW (ref 36.0–46.0)
Hemoglobin: 8.6 g/dL — ABNORMAL LOW (ref 12.0–15.0)
Immature Granulocytes: 5 %
Lymphocytes Relative: 3 %
Lymphs Abs: 0.3 10*3/uL — ABNORMAL LOW (ref 0.7–4.0)
MCH: 31.6 pg (ref 26.0–34.0)
MCHC: 31.2 g/dL (ref 30.0–36.0)
MCV: 101.5 fL — ABNORMAL HIGH (ref 80.0–100.0)
Monocytes Absolute: 0.7 10*3/uL (ref 0.1–1.0)
Monocytes Relative: 7 %
Neutro Abs: 9 10*3/uL — ABNORMAL HIGH (ref 1.7–7.7)
Neutrophils Relative %: 85 %
Platelets: 125 10*3/uL — ABNORMAL LOW (ref 150–400)
RBC: 2.72 MIL/uL — ABNORMAL LOW (ref 3.87–5.11)
RDW: 24.5 % — ABNORMAL HIGH (ref 11.5–15.5)
WBC: 10.6 10*3/uL — ABNORMAL HIGH (ref 4.0–10.5)
nRBC: 2.8 % — ABNORMAL HIGH (ref 0.0–0.2)

## 2019-08-17 MED ORDER — HEPARIN SODIUM (PORCINE) 1000 UNIT/ML IJ SOLN
INTRAMUSCULAR | Status: AC
  Administered 2019-08-17: 4200 [IU]
  Filled 2019-08-17: qty 4

## 2019-08-17 NOTE — Progress Notes (Signed)
PROGRESS NOTE    Tina Watkins  KXF:[id number] DOB: 1965/05/25 DOA: 08/05/2019 PCP: [name], FNP   Brief Narrative: Patient is a 5-year female with history of ESRD status post renal transplant complicated by CKD stage IV on chronic steroids and immunosuppressive therapy, chronic anemia secondary to CKD, hypertension, TIA, chronic diastolic congestive heart failure, moderate pulmonary hypertension who presented with multiple joints pain which significantly limited her mobility. Symptoms started about a week ago. It involved right wrist and several fingers and right elbow and bilateral knees and ankles. She also had low-grade fever. Patient also developed productive cough with yellow sputum. She was started on hemodialysis this June because of failed kidney transplant.She has a right-sided femoral HD catheter for dialysis. On presentation, chest x-ray showed possible left lower lobe pneumonia. Admitted for further management.Startedon IV antibiotics and being also managed for acute gout flare.  Hospital course remained stable.  She was seen by PT and recommended home health on discharge.  She has chronic pain due to her gouty arthritis.  Her respiratory status is stable and she is maintaining her saturation on room air.  PT recommended SNF. Pending placement  Assessment & Plan:   Active Problems:   HCAP (healthcare-associated pneumonia)  Healthcare associated pneumonia: Presented with cough, new infiltrate on the chest x-ray.Leukocytosis.started on vancomycin and Zosyn.Elevated procalcitonin. Presented with fever. Since MRSA PCR is negative, vancomycin stopped.  Afebrile now.  Cultures have remained negative.  Her respiratory status is stable.  Currently on room air.  Antibiotics changed to oral Augmentin which she completed course on 08/11/2019.  ESRD on hemodialysis/failed renal transplant x3:Nephrology following. She has right-sided femoral dialysis  catheter. She has extremely limited IV access on the upper extremities. She is following with vascular surgery as an outpatient for permanent access. Patient still produces urine.    She gets dialyzed on TTS schedule.  Acute gouty flareup: Presented with pain on the multiple joints, more pronounced in bilateral ankle and right thumb.  Colchicine restarted due to severe pain. Dose of steroid has been increased from her baseline of 5 mg p.o. daily to 20 mg p.o. daily which was increased to twice daily on 08/12/2019 and patient continues to feel better. Her ankle pain and right thumb pain is improved. Her right thumb is back to normal and is nontender.  Continue with tapering dose of prednisone.  Currently on 30 mg.  Will start on 20 mg on 08/19/2019.  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 Her nephrologist is still recommending to continue steroid and tacrolimus. Imuran was discontinued last month due to worsening pancytopenia.  Chronic  hypotension:On midodrine. BP soft. She has chronic hypotension.  Leukocytosis:Leukocytosis most likely associated with her acute gout flareup. resolved  Anemia of chronic disease/thrombocytopenia: Chronic microcytosis.  B12 and folate were normal.  Could be due to kidney disease.  Hemoglobin dropped to 6.9 today.  1 unit of PRBC transfusion ordered by night physician. Will check FOBT, reticulocyte, ferritin, TIBC and iron.  Chronic diastolic heart failure: Volume managed by dialysis. Last echo on 12/20 showed restrictive cardiomyopathy, grade 3 diastolic dysfunction, preserved ejection fraction, moderate pulmonary hypertension  History of Covid infection:On April 2019. Presented with Covid pneumonia and had prolonged hospitalization.  Debility/deconditioning: She is severely deconditioned due to her debilitating chronic gouty arthritis. PT recommended skilled nursing facility on discharge.  Patient was confident  that she has progressed and now she might be able to go home however upon reevaluation by PT ,  they still believe that patient will benefit from SNF.  TOC working on placement.   Nutrition Problem: Increased nutrient needs Etiology: chronic illness (ESRD on HD, CHF)    DVT prophylaxis: Heparin subcu Code Status: Full code Family Communication: Discussed with patient  status is: Inpatient  Remains inpatient appropriate because:unsafe DC plan  Dispo: The patient is from: Home              Anticipated d/c is to: SNF vs home with home health depending on how quickly patient progresses              Anticipated d/c date is: Unknown.  Depends on bed availability.              Patient currently is medically stable for discharge  Consultants: Nephrology  Procedures: Hemodialysis  Antimicrobials:  Anti-infectives (From admission, onward)   Start     Dose/Rate Route Frequency Ordered Stop   08/08/19 1200  vancomycin (VANCOCIN) IVPB 500 mg/100 ml premix  Status:  Discontinued        500 mg 100 mL/hr over 60 Minutes Intravenous Every T-Th-Sa (Hemodialysis) 08/05/19 1522 08/07/19 0748   08/08/19 1200  amoxicillin-clavulanate (AUGMENTIN) 500-125 MG per tablet 500 mg        1 tablet Oral 2 times daily 08/08/19 1101 08/11/19 1900   08/08/19 0000  amoxicillin-clavulanate (AUGMENTIN) 500-125 MG tablet  Status:  Discontinued        1 tablet Oral 2 times daily 08/08/19 1233 08/11/19    08/05/19 2030  piperacillin-tazobactam (ZOSYN) IVPB 2.25 g  Status:  Discontinued        2.25 g 100 mL/hr over 30 Minutes Intravenous Every 8 hours 08/05/19 1522 08/08/19 1101   08/05/19 1230  vancomycin (VANCOCIN) IVPB 1000 mg/200 mL premix        1,000 mg 200 mL/hr over 60 Minutes Intravenous  Once 08/05/19 1219 08/05/19 1412   08/05/19 1230  piperacillin-tazobactam (ZOSYN) IVPB 3.375 g        3.375 g 100 mL/hr over 30 Minutes Intravenous  Once 08/05/19 1219 08/05/19 1328      Subjective:  Seen and  examined.  She has no complaints.  Objective: Vitals:   08/16/19 0755 08/16/19 1637 08/17/19 0500 08/17/19 0806  BP: 110/65 105/61  119/68  Pulse: (!) 54 61  (!) 49  Resp:    16  Temp: 98 F (36.7 C) 98.6 F (37 C)  98.1 F (36.7 C)  TempSrc:      SpO2: 100% 99%  100%  Weight:   53 kg   Height:        Intake/Output Summary (Last 24 hours) at 08/17/2019 1106 Last data filed at 08/16/2019 2130 Gross per 24 hour  Intake 240 ml  Output --  Net 240 ml   Filed Weights   08/15/19 1140 08/16/19 0500 08/17/19 0500  Weight: 55.5 kg 53.9 kg 53 kg    Examination:  General exam: Appears calm and comfortable  Respiratory system: Clear to auscultation. Respiratory effort normal. Cardiovascular system: S1 & S2 heard, RRR. No JVD, murmurs, rubs, gallops or clicks. No pedal edema. Gastrointestinal system: Abdomen is nondistended, soft and nontender. No organomegaly or masses felt. Normal bowel sounds heard. Central nervous system: Alert and oriented. No focal neurological deficits. Extremities: Symmetric 5 x 5 power. Skin: No rashes, lesions or ulcers.  Psychiatry: Judgement and insight appear normal. Mood & affect appropriate.   Data Reviewed: I have personally reviewed following labs and imaging studies  CBC: Recent Labs  Lab 08/13/19 1043 08/13/19 1043 08/14/19 0338 08/14/19 1645 08/15/19 0355 08/16/19 0453 08/17/19 0358  WBC 9.5  --  7.5  --  8.0 7.8 10.6*  NEUTROABS 7.2  --  6.3  --  6.9 6.6 9.0*  HGB 8.0*   < > 6.9* 8.4* 8.7* 8.2* 8.6*  HCT 25.6*   < > 21.9* 26.8* 26.7* 26.8* 27.6*  MCV 107.1*  --  107.4*  --  100.4* 103.1* 101.5*  PLT 131*  --  130*  --  130* 108* 125*   < > = values in this interval not displayed.   Basic Metabolic Panel: Recent Labs  Lab 08/13/19 0331 08/14/19 0338 08/15/19 0355 08/16/19 0453 08/17/19 0358  NA 138 139 137 136 135  K 5.0 4.4 4.7 4.0 4.1  CL 105 102 98 100 98  CO2 22 25 24  20* 20*  GLUCOSE 107* 113* 112* 119* 113*  BUN 40*  60* 75* 44* 62*  CREATININE 3.16* 4.60* 5.73* 3.86* 4.97*  CALCIUM 8.4* 9.0 9.4 8.6* 8.9  PHOS 2.9 4.1 5.6* 4.9* 5.6*   GFR: Estimated Creatinine Clearance: 10 mL/min (A) (by C-G formula based on SCr of 4.97 mg/dL (H)). Liver Function Tests: Recent Labs  Lab 08/13/19 0331 08/14/19 0338 08/15/19 0355 08/16/19 0453 08/17/19 0358  ALBUMIN 2.7* 2.8* 3.0* 2.8* 2.9*   No results for input(s): LIPASE, AMYLASE in the last 168 hours. No results for input(s): AMMONIA in the last 168 hours. Coagulation Profile: No results for input(s): INR, PROTIME in the last 168 hours. Cardiac Enzymes: No results for input(s): CKTOTAL, CKMB, CKMBINDEX, TROPONINI in the last 168 hours. BNP (last 3 results) No results for input(s): PROBNP in the last 8760 hours. HbA1C: No results for input(s): HGBA1C in the last 72 hours. CBG: No results for input(s): GLUCAP in the last 168 hours. Lipid Profile: No results for input(s): CHOL, HDL, LDLCALC, TRIG, CHOLHDL, LDLDIRECT in the last 72 hours. Thyroid Function Tests: No results for input(s): TSH, T4TOTAL, FREET4, T3FREE, THYROIDAB in the last 72 hours. Anemia Panel: Recent Labs    08/14/19 1645  FERRITIN 1,389*  TIBC 175*  IRON 127  RETICCTPCT 9.2*   Sepsis Labs: No results for input(s): PROCALCITON, LATICACIDVEN in the last 168 hours.  No results found for this or any previous visit (from the past 240 hour(s)).       Radiology Studies: No results found.      Scheduled Meds: . sodium chloride   Intravenous Once  . allopurinol  100 mg Oral Daily  . aspirin EC  81 mg Oral Daily  . Chlorhexidine Gluconate Cloth  6 each Topical Q0600  . colchicine  0.3 mg Oral Daily  . darbepoetin (ARANESP) injection - DIALYSIS  100 mcg Intravenous Q Tue-HD  . famotidine  20 mg Oral Daily  . feeding supplement (NEPRO CARB STEADY)  237 mL Oral BID BM  . feeding supplement (PRO-STAT SUGAR FREE 64)  30 mL Oral BID  . gabapentin  100 mg Oral BID  .  guaiFENesin  600 mg Oral BID  . heparin  5,000 Units Subcutaneous Q12H  . latanoprost  1 drop Left Eye QHS  . midodrine  10 mg Oral BID WC  . multivitamin  1 tablet Oral QHS  . predniSONE  30 mg Oral Q breakfast   Followed by  . [START ON 08/19/2019] predniSONE  20 mg Oral Q breakfast  . tacrolimus  2 mg Oral QHS  . tacrolimus  3  mg Oral q morning - 10a  . Vitamin D (Ergocalciferol)  50,000 Units Oral Q Sun   Continuous Infusions:    LOS: 12 days   Time spent: 26 mins  Darliss Cheney, MD [facility] P7/15/2021, 11:06 AM

## 2019-08-17 NOTE — Progress Notes (Signed)
[facility] Progress Note  Subjective: pt seen on HD, no new c/o  Vitals:   08/17/19 1300 08/17/19 1330 08/17/19 1400 08/17/19 1430  BP: 97/66 102/66 109/69 97/64  Pulse: (!) 57 (!) 58 (!) 53 66  Resp:      Temp:      TempSrc:      SpO2:      Weight:      Height:        Exam: General:NAD, comfortable. Heart:RRR, s1s2 nl, no rubs Lungs: rales/ rhonchi R base, L clear Abdomen:soft, Non-tender, non-distended Extremities: bilat thigh edema/ R breast 2+  R hand erythema much better Dialysis Access: Right femoral HD catheter    OP HD: TTS [facility]    4h  57.5kg  2/2 bath  Hep 1000  R fem TDC  mircera 100 q2 last 6/24  Assessment/ Plan: 1. HCAP- suspected LLL PNA, treated with IV vancomycin and Zosyn.  Clinically improved. Completed abx course. 2. Acute gout flare: On colchicine and steroid, pred ^'d 20 bid for pain. Better. Have restarted allopurinol since pt is now off of Imuran.  3. ESRD / failed renal Tx: recently resumed HD in May 2021. TTS HD. 4. Hypotension/Vol overload: Chronic hypotension therefore on midodrine. Is 3kg under but still has significant dependent edema, will lower edw further as tolerated. SBP threshold of 75 works for this pt.   5. 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 7/12 prograf "trough" is high at 21, but looks like it was drawn shortly after the am dose, not before. Will not adjust. Imuran was stopped in June d/t leukopenia, will keep this off.  6. Anemia of ckd: Started Aranesp.  No iron because of infection. 7. MBD ckd: Calcium phosphorus okay.  Monitor lab. 8. H/o COVID infection - prior prolonged COVID admit in April. COVID negative on admission here.  9. Deconditioning - awaiting SNF placement   Rob Kiwanna Spraker 08/17/2019, 3:00 PM   Recent Labs  Lab 08/16/19 0453 08/17/19 0358  K 4.0 4.1  BUN 44* 62*  CREATININE 3.86* 4.97*  CALCIUM 8.6* 8.9  PHOS 4.9* 5.6*  HGB 8.2* 8.6*   Inpatient  medications: . sodium chloride   Intravenous Once  . allopurinol  100 mg Oral Daily  . aspirin EC  81 mg Oral Daily  . Chlorhexidine Gluconate Cloth  6 each Topical Q0600  . colchicine  0.3 mg Oral Daily  . darbepoetin (ARANESP) injection - DIALYSIS  100 mcg Intravenous Q Tue-HD  . famotidine  20 mg Oral Daily  . feeding supplement (NEPRO CARB STEADY)  237 mL Oral BID BM  . feeding supplement (PRO-STAT SUGAR FREE 64)  30 mL Oral BID  . gabapentin  100 mg Oral BID  . guaiFENesin  600 mg Oral BID  . heparin  5,000 Units Subcutaneous Q12H  . latanoprost  1 drop Left Eye QHS  . midodrine  10 mg Oral BID WC  . multivitamin  1 tablet Oral QHS  . predniSONE  30 mg Oral Q breakfast   Followed by  . [START ON 08/19/2019] predniSONE  20 mg Oral Q breakfast  . tacrolimus  2 mg Oral QHS  . tacrolimus  3 mg Oral q morning - 10a  . Vitamin D (Ergocalciferol)  50,000 Units Oral Q Sun    albuterol, albuterol, diphenhydrAMINE, fluticasone, HYDROmorphone, lidocaine, nystatin, ondansetron, zolpidem

## 2019-08-18 DIAGNOSIS — L89151 Pressure ulcer of sacral region, stage 1: Secondary | ICD-10-CM

## 2019-08-18 LAB — RENAL FUNCTION PANEL
Albumin: 2.8 g/dL — ABNORMAL LOW (ref 3.5–5.0)
Anion gap: 13 (ref 5–15)
BUN: 36 mg/dL — ABNORMAL HIGH (ref 6–20)
CO2: 22 mmol/L (ref 22–32)
Calcium: 8.8 mg/dL — ABNORMAL LOW (ref 8.9–10.3)
Chloride: 98 mmol/L (ref 98–111)
Creatinine, Ser: 3.73 mg/dL — ABNORMAL HIGH (ref 0.44–1.00)
GFR calc Af Amer: 15 mL/min — ABNORMAL LOW (ref >60)
GFR calc non Af Amer: 13 mL/min — ABNORMAL LOW (ref >60)
Glucose, Bld: 88 mg/dL (ref 70–99)
Phosphorus: 4.7 mg/dL — ABNORMAL HIGH (ref 2.5–4.6)
Potassium: 3.9 mmol/L (ref 3.5–5.1)
Sodium: 133 mmol/L — ABNORMAL LOW (ref 135–145)

## 2019-08-18 LAB — CBC WITH DIFFERENTIAL/PLATELET
Abs Immature Granulocytes: 0.45 10*3/uL — ABNORMAL HIGH (ref 0.00–0.07)
Basophils Absolute: 0 10*3/uL (ref 0.0–0.1)
Basophils Relative: 0 %
Eosinophils Absolute: 0 10*3/uL (ref 0.0–0.5)
Eosinophils Relative: 0 %
HCT: 27.9 % — ABNORMAL LOW (ref 36.0–46.0)
Hemoglobin: 9 g/dL — ABNORMAL LOW (ref 12.0–15.0)
Immature Granulocytes: 4 %
Lymphocytes Relative: 5 %
Lymphs Abs: 0.5 10*3/uL — ABNORMAL LOW (ref 0.7–4.0)
MCH: 33.3 pg (ref 26.0–34.0)
MCHC: 32.3 g/dL (ref 30.0–36.0)
MCV: 103.3 fL — ABNORMAL HIGH (ref 80.0–100.0)
Monocytes Absolute: 1 10*3/uL (ref 0.1–1.0)
Monocytes Relative: 9 %
Neutro Abs: 9.4 10*3/uL — ABNORMAL HIGH (ref 1.7–7.7)
Neutrophils Relative %: 82 %
Platelets: 124 10*3/uL — ABNORMAL LOW (ref 150–400)
RBC: 2.7 MIL/uL — ABNORMAL LOW (ref 3.87–5.11)
RDW: 24 % — ABNORMAL HIGH (ref 11.5–15.5)
WBC: 11.4 10*3/uL — ABNORMAL HIGH (ref 4.0–10.5)
nRBC: 3.5 % — ABNORMAL HIGH (ref 0.0–0.2)

## 2019-08-18 NOTE — Plan of Care (Signed)

## 2019-08-18 NOTE — Progress Notes (Signed)
Patient ID: Tina Watkins, female   DOB: 16-Jul-1965, 54 y.o.   MRN: [id number] [facility] Progress Note   Assessment/ Plan:   1.  Healthcare associated pneumonia: Status post completion of intravenous/oral antibiotics for suspected left lower lobe pneumonia-she remains afebrile with some worsening of leukocytosis overnight that is suspected to be from her acute gout flare.  Continues to report clinical improvement but recently with deconditioning progressively following hospitalization earlier this year with COVID-19, deciding on placement following discharge-likely SNF. 2. 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  Continue this on TTS dialysis schedule with next dialysis due for tomorrow.  Right femoral HD catheter with essentially end-stage access situation. 3. Anemia: She denies any overt blood loss with gradually improving hemoglobin/hematocrit on ESA. 4. CKD-MBD: Calcium level acceptable with improving phosphorus; continue to monitor on hemodialysis. 5.  Acute gout flare: Continue with prednisone taper, colchicine and uric acid lowering therapy with allopurinol. 6. Hypotension: Continue midodrine for blood pressure support and monitor with UF on HD.  Subjective:   Reports that she continues to feel stronger and denies any chest pain or shortness of breath this morning.  Excited that she had 3 L ultrafiltration with hemodialysis yesterday.   Objective:   BP 104/60 (BP Location: Right Arm)   Pulse 78   Temp 98.3 F (36.8 C) (Oral)   Resp 16   Ht 4\' 11"  (1.499 m)   Wt 52.2 kg   SpO2 98%   BMI 23.24 kg/m   Physical Exam: Gen: Comfortably resting in bed, working on cell phone CVS: Pulse regular rhythm, normal rate, S1 and S2 normal Resp: Diminished breath sounds over bases without distinct rales or rhonchi Abd: Soft, flat, nontender Ext: 1+ bilateral thigh edema with trace pretibial edema.  Right femoral HD  catheter.  Labs: BMET Recent Labs  Lab 08/12/19 0359 08/13/19 0331 08/14/19 0338 08/15/19 0355 08/16/19 0453 08/17/19 0358 08/18/19 0311  NA 138 138 139 137 136 135 133*  K 3.8 5.0 4.4 4.7 4.0 4.1 3.9  CL 101 105 102 98 100 98 98  CO2 25 22 25 24  20* 20* 22  GLUCOSE 104* 107* 113* 112* 119* 113* 88  BUN 36* 40* 60* 75* 44* 62* 36*  CREATININE 4.53* 3.16* 4.60* 5.73* 3.86* 4.97* 3.73*  CALCIUM 8.8* 8.4* 9.0 9.4 8.6* 8.9 8.8*  PHOS 3.1 2.9 4.1 5.6* 4.9* 5.6* 4.7*   CBC Recent Labs  Lab 08/15/19 0355 08/16/19 0453 08/17/19 0358 08/18/19 0311  WBC 8.0 7.8 10.6* 11.4*  NEUTROABS 6.9 6.6 9.0* 9.4*  HGB 8.7* 8.2* 8.6* 9.0*  HCT 26.7* 26.8* 27.6* 27.9*  MCV 100.4* 103.1* 101.5* 103.3*  PLT 130* 108* 125* 124*      Medications:    . sodium chloride   Intravenous Once  . allopurinol  100 mg Oral Daily  . aspirin EC  81 mg Oral Daily  . Chlorhexidine Gluconate Cloth  6 each Topical Q0600  . colchicine  0.3 mg Oral Daily  . darbepoetin (ARANESP) injection - DIALYSIS  100 mcg Intravenous Q Tue-HD  . famotidine  20 mg Oral Daily  . feeding supplement (NEPRO CARB STEADY)  237 mL Oral BID BM  . feeding supplement (PRO-STAT SUGAR FREE 64)  30 mL Oral BID  . gabapentin  100 mg Oral BID  . guaiFENesin  600 mg Oral BID  . heparin  5,000 Units Subcutaneous Q12H  . latanoprost  1 drop Left Eye QHS  . midodrine  10 mg Oral BID WC  . multivitamin  1 tablet Oral QHS  . predniSONE  30 mg Oral Q breakfast   Followed by  . [START ON 08/19/2019] predniSONE  20 mg Oral Q breakfast  . tacrolimus  2 mg Oral QHS  . tacrolimus  3 mg Oral q morning - 10a  . Vitamin D (Ergocalciferol)  50,000 Units Oral Q Glennie Hawk, MD 08/18/2019, 8:09 AM

## 2019-08-18 NOTE — TOC Progression Note (Signed)
Transition of Care [facility]) - Progression Note    Patient Details  Name: Tina Watkins MRN: [id number] Date of Birth: [birth year]  Transition of Care [facility]) CM/SW [place], RN Phone Number: [phone] 08/18/2019, 3:51 PM  Clinical Narrative:    Patient still has no bed offers I have followed up with facilities with still no response for offers. Patient is still not strong enough to go home independently.  Patient is not willing to extend bed search out of [place] area at this time. TOC will continue to follow for placement.         Expected Discharge Plan and Services           Expected Discharge Date: 08/08/19                                     Social Determinants of Health (SDOH) Interventions    Readmission Risk Interventions Readmission Risk Prevention Plan 05/18/2019  Transportation Screening Complete  PCP or Specialist Appt within 3-5 Days Complete  HRI or [place] (No Data)  Medication Review Press photographer) Complete  Some recent data might be hidden

## 2019-08-18 NOTE — Progress Notes (Signed)
PROGRESS NOTE    Tina Watkins  PYP:[id number] DOB: [birth year] DOA: 08/05/2019 PCP: [name], FNP   Brief Narrative: Patient is a 70-year female with history of ESRD status post renal transplant complicated by CKD stage IV on chronic steroids and immunosuppressive therapy, chronic anemia secondary to CKD, hypertension, TIA, chronic diastolic congestive heart failure, moderate pulmonary hypertension who presented with multiple joints pain which significantly limited her mobility. Symptoms started about a week ago. It involved right wrist and several fingers and right elbow and bilateral knees and ankles. She also had low-grade fever. Patient also developed productive cough with yellow sputum. She was started on hemodialysis this June because of failed kidney transplant.She has a right-sided femoral HD catheter for dialysis. On presentation, chest x-ray showed possible left lower lobe pneumonia. Admitted for further management.Startedon IV antibiotics and being also managed for acute gout flare.  Hospital course remained stable.  She was seen by PT and recommended home health on discharge.  She has chronic pain due to her gouty arthritis.  Her respiratory status is stable and she is maintaining her saturation on room air.  PT recommended SNF. Pending placement  Assessment & Plan:   Active Problems:   HCAP (healthcare-associated pneumonia)   Decubitus ulcer of sacral region, stage 1  Healthcare associated pneumonia: Presented with cough, new infiltrate on the chest x-ray.Leukocytosis.started on vancomycin and Zosyn.Elevated procalcitonin. Presented with fever. Since MRSA PCR is negative, vancomycin stopped.  Afebrile now.  Cultures have remained negative.  Her respiratory status is stable.  Currently on room air.  Antibiotics changed to oral Augmentin which she completed course on 08/11/2019.  ESRD on hemodialysis/failed renal transplant x3:Nephrology following.  She has right-sided femoral dialysis catheter. She has extremely limited IV access on the upper extremities. She is following with vascular surgery as an outpatient for permanent access. Patient still produces urine.    She gets dialyzed on TTS schedule.  Acute gout flareup: Presented with pain on the multiple joints, more pronounced in bilateral ankle and right thumb.  Colchicine restarted due to severe pain. Dose of steroid has been increased from her baseline of 5 mg p.o. daily to 20 mg p.o. daily which was increased to twice daily on 08/12/2019 and patient continues to feel better. Her ankle pain and right thumb pain has resolved.. Her right thumb is back to normal and is nontender.  Continue with tapering dose of prednisone.  Currently on 30 mg.  Will start on 20 mg on 08/19/2019 for 3 days and then 10 mg 3 days and then back to 5 mg, her home dose.  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 Her nephrologist is still recommending to continue steroid and tacrolimus. Imuran was discontinued last month due to worsening pancytopenia.  Chronic  hypotension:On midodrine. BP soft. She has chronic hypotension.  Leukocytosis:Leukocytosis most likely associated with her acute gout flareup. resolved  Anemia of chronic disease/thrombocytopenia: Chronic microcytosis.  B12 and folate were normal.  Could be due to kidney disease.  Hemoglobin dropped to 6.9 today.  1 unit of PRBC transfusion ordered by night physician. Will check FOBT, reticulocyte, ferritin, TIBC and iron.  Chronic diastolic heart failure: Volume managed by dialysis. Last echo on 12/20 showed restrictive cardiomyopathy, grade 3 diastolic dysfunction, preserved ejection fraction, moderate pulmonary hypertension  History of Covid infection:On April 2021. Presented with Covid pneumonia and had prolonged hospitalization.  Debility/deconditioning: She is severely deconditioned due to her  debilitating chronic gouty arthritis. PT recommended  skilled nursing facility on discharge.  Patient was confident that she has progressed and now she might be able to go home however upon reevaluation by PT , they still believe that patient will benefit from SNF.  TOC working on placement.  Placement has been difficult due to her having Medicaid insurance and requiring hemodialysis transport.  Stage I sacral decubitus ulcer, POA: Dressing per nursing.   Nutrition Problem: Increased nutrient needs Etiology: chronic illness (ESRD on HD, CHF)    DVT prophylaxis: Heparin subcu Code Status: Full code Family Communication: Discussed with patient  status is: Inpatient  Remains inpatient appropriate because:unsafe DC plan  Dispo: The patient is from: Home              Anticipated d/c is to: SNF vs home with home health depending on how quickly patient progresses              Anticipated d/c date is: Unknown.  Depends on bed availability.              Patient currently is medically stable for discharge  Consultants: Nephrology  Procedures: Hemodialysis  Antimicrobials:  Anti-infectives (From admission, onward)   Start     Dose/Rate Route Frequency Ordered Stop   08/08/19 1200  vancomycin (VANCOCIN) IVPB 500 mg/100 ml premix  Status:  Discontinued        500 mg 100 mL/hr over 60 Minutes Intravenous Every T-Th-Sa (Hemodialysis) 08/05/19 1522 08/07/19 0748   08/08/19 1200  amoxicillin-clavulanate (AUGMENTIN) 500-125 MG per tablet 500 mg        1 tablet Oral 2 times daily 08/08/19 1101 08/11/19 1900   08/08/19 0000  amoxicillin-clavulanate (AUGMENTIN) 500-125 MG tablet  Status:  Discontinued        1 tablet Oral 2 times daily 08/08/19 1233 08/11/19    08/05/19 2030  piperacillin-tazobactam (ZOSYN) IVPB 2.25 g  Status:  Discontinued        2.25 g 100 mL/hr over 30 Minutes Intravenous Every 8 hours 08/05/19 1522 08/08/19 1101   08/05/19 1230  vancomycin (VANCOCIN) IVPB 1000 mg/200 mL premix         1,000 mg 200 mL/hr over 60 Minutes Intravenous  Once 08/05/19 1219 08/05/19 1412   08/05/19 1230  piperacillin-tazobactam (ZOSYN) IVPB 3.375 g        3.375 g 100 mL/hr over 30 Minutes Intravenous  Once 08/05/19 1219 08/05/19 1328      Subjective:  Patient seen and examined.  She tells me that she feels "fantastic"  Objective: Vitals:   08/17/19 1600 08/17/19 2122 08/18/19 0500 08/18/19 0736  BP: 108/70 105/73  104/60  Pulse: 62 (!) 57  78  Resp: 16   16  Temp: 98.7 F (37.1 C) 98.5 F (36.9 C)  98.3 F (36.8 C)  TempSrc:  Oral  Oral  SpO2: 99% 99%  98%  Weight:   52.2 kg   Height:        Intake/Output Summary (Last 24 hours) at 08/18/2019 1037 Last data filed at 08/17/2019 2130 Gross per 24 hour  Intake 240 ml  Output 3000 ml  Net -2760 ml   Filed Weights   08/17/19 1149 08/17/19 1521 08/18/19 0500  Weight: 52.5 kg 49.5 kg 52.2 kg    Examination:  General exam: Appears calm and comfortable  Respiratory system: Clear to auscultation. Respiratory effort normal. Cardiovascular system: S1 & S2 heard, RRR. No JVD, murmurs, rubs, gallops or clicks. No pedal edema. Gastrointestinal system: Abdomen is nondistended, soft and  nontender. No organomegaly or masses felt. Normal bowel sounds heard. Central nervous system: Alert and oriented. No focal neurological deficits. Extremities: Symmetric 5 x 5 power. Skin: No rashes, lesions or ulcers.  Psychiatry: Judgement and insight appear normal. Mood & affect appropriate.    Data Reviewed: I have personally reviewed following labs and imaging studies  CBC: Recent Labs  Lab 08/14/19 0338 08/14/19 0338 08/14/19 1645 08/15/19 0355 08/16/19 0453 08/17/19 0358 08/18/19 0311  WBC 7.5  --   --  8.0 7.8 10.6* 11.4*  NEUTROABS 6.3  --   --  6.9 6.6 9.0* 9.4*  HGB 6.9*   < > 8.4* 8.7* 8.2* 8.6* 9.0*  HCT 21.9*   < > 26.8* 26.7* 26.8* 27.6* 27.9*  MCV 107.4*  --   --  100.4* 103.1* 101.5* 103.3*  PLT 130*  --   --  130*  108* 125* 124*   < > = values in this interval not displayed.   Basic Metabolic Panel: Recent Labs  Lab 08/14/19 0338 08/15/19 0355 08/16/19 0453 08/17/19 0358 08/18/19 0311  NA 139 137 136 135 133*  K 4.4 4.7 4.0 4.1 3.9  CL 102 98 100 98 98  CO2 25 24 20* 20* 22  GLUCOSE 113* 112* 119* 113* 88  BUN 60* 75* 44* 62* 36*  CREATININE 4.60* 5.73* 3.86* 4.97* 3.73*  CALCIUM 9.0 9.4 8.6* 8.9 8.8*  PHOS 4.1 5.6* 4.9* 5.6* 4.7*   GFR: Estimated Creatinine Clearance: 13.2 mL/min (A) (by C-G formula based on SCr of 3.73 mg/dL (H)). Liver Function Tests: Recent Labs  Lab 08/14/19 0338 08/15/19 0355 08/16/19 0453 08/17/19 0358 08/18/19 0311  ALBUMIN 2.8* 3.0* 2.8* 2.9* 2.8*   No results for input(s): LIPASE, AMYLASE in the last 168 hours. No results for input(s): AMMONIA in the last 168 hours. Coagulation Profile: No results for input(s): INR, PROTIME in the last 168 hours. Cardiac Enzymes: No results for input(s): CKTOTAL, CKMB, CKMBINDEX, TROPONINI in the last 168 hours. BNP (last 3 results) No results for input(s): PROBNP in the last 8760 hours. HbA1C: No results for input(s): HGBA1C in the last 72 hours. CBG: No results for input(s): GLUCAP in the last 168 hours. Lipid Profile: No results for input(s): CHOL, HDL, LDLCALC, TRIG, CHOLHDL, LDLDIRECT in the last 72 hours. Thyroid Function Tests: No results for input(s): TSH, T4TOTAL, FREET4, T3FREE, THYROIDAB in the last 72 hours. Anemia Panel: No results for input(s): VITAMINB12, FOLATE, FERRITIN, TIBC, IRON, RETICCTPCT in the last 72 hours. Sepsis Labs: No results for input(s): PROCALCITON, LATICACIDVEN in the last 168 hours.  No results found for this or any previous visit (from the past 240 hour(s)).       Radiology Studies: No results found.      Scheduled Meds: . sodium chloride   Intravenous Once  . allopurinol  100 mg Oral Daily  . aspirin EC  81 mg Oral Daily  . Chlorhexidine Gluconate Cloth  6  each Topical Q0600  . colchicine  0.3 mg Oral Daily  . darbepoetin (ARANESP) injection - DIALYSIS  100 mcg Intravenous Q Tue-HD  . famotidine  20 mg Oral Daily  . feeding supplement (NEPRO CARB STEADY)  237 mL Oral BID BM  . feeding supplement (PRO-STAT SUGAR FREE 64)  30 mL Oral BID  . gabapentin  100 mg Oral BID  . guaiFENesin  600 mg Oral BID  . heparin  5,000 Units Subcutaneous Q12H  . latanoprost  1 drop Left Eye QHS  . midodrine  10 mg Oral BID WC  . multivitamin  1 tablet Oral QHS  . [START ON 08/19/2019] predniSONE  20 mg Oral Q breakfast  . tacrolimus  2 mg Oral QHS  . tacrolimus  3 mg Oral q morning - 10a  . Vitamin D (Ergocalciferol)  50,000 Units Oral Q Sun   Continuous Infusions:    LOS: 13 days   Time spent: 25 mins  Darliss Cheney, MD [facility] P7/16/2021, 10:37 AM

## 2019-08-19 LAB — CBC WITH DIFFERENTIAL/PLATELET
Abs Immature Granulocytes: 0.22 10*3/uL — ABNORMAL HIGH (ref 0.00–0.07)
Basophils Absolute: 0 10*3/uL (ref 0.0–0.1)
Basophils Relative: 0 %
Eosinophils Absolute: 0 10*3/uL (ref 0.0–0.5)
Eosinophils Relative: 0 %
HCT: 29.2 % — ABNORMAL LOW (ref 36.0–46.0)
Hemoglobin: 9.2 g/dL — ABNORMAL LOW (ref 12.0–15.0)
Immature Granulocytes: 2 %
Lymphocytes Relative: 3 %
Lymphs Abs: 0.3 10*3/uL — ABNORMAL LOW (ref 0.7–4.0)
MCH: 32.5 pg (ref 26.0–34.0)
MCHC: 31.5 g/dL (ref 30.0–36.0)
MCV: 103.2 fL — ABNORMAL HIGH (ref 80.0–100.0)
Monocytes Absolute: 0.8 10*3/uL (ref 0.1–1.0)
Monocytes Relative: 8 %
Neutro Abs: 9.2 10*3/uL — ABNORMAL HIGH (ref 1.7–7.7)
Neutrophils Relative %: 87 %
Platelets: 115 10*3/uL — ABNORMAL LOW (ref 150–400)
RBC: 2.83 MIL/uL — ABNORMAL LOW (ref 3.87–5.11)
RDW: 23.9 % — ABNORMAL HIGH (ref 11.5–15.5)
WBC: 10.5 10*3/uL (ref 4.0–10.5)
nRBC: 1 % — ABNORMAL HIGH (ref 0.0–0.2)

## 2019-08-19 LAB — RENAL FUNCTION PANEL
Albumin: 2.9 g/dL — ABNORMAL LOW (ref 3.5–5.0)
Anion gap: 14 (ref 5–15)
BUN: 52 mg/dL — ABNORMAL HIGH (ref 6–20)
CO2: 22 mmol/L (ref 22–32)
Calcium: 8.6 mg/dL — ABNORMAL LOW (ref 8.9–10.3)
Chloride: 98 mmol/L (ref 98–111)
Creatinine, Ser: 5.05 mg/dL — ABNORMAL HIGH (ref 0.44–1.00)
GFR calc Af Amer: 11 mL/min — ABNORMAL LOW (ref >60)
GFR calc non Af Amer: 9 mL/min — ABNORMAL LOW (ref >60)
Glucose, Bld: 101 mg/dL — ABNORMAL HIGH (ref 70–99)
Phosphorus: 5.6 mg/dL — ABNORMAL HIGH (ref 2.5–4.6)
Potassium: 3.8 mmol/L (ref 3.5–5.1)
Sodium: 134 mmol/L — ABNORMAL LOW (ref 135–145)

## 2019-08-19 LAB — HEPATITIS B SURFACE ANTIGEN: Hepatitis B Surface Ag: NONREACTIVE

## 2019-08-19 MED ORDER — HEPARIN SODIUM (PORCINE) 1000 UNIT/ML IJ SOLN
INTRAMUSCULAR | Status: AC
  Administered 2019-08-19: 4200 [IU]
  Filled 2019-08-19: qty 6

## 2019-08-19 MED ORDER — CALCIUM ACETATE (PHOS BINDER) 667 MG PO CAPS
667.0000 mg | ORAL_CAPSULE | Freq: Three times a day (TID) | ORAL | Status: DC
  Administered 2019-08-19 – 2019-08-24 (×17): 667 mg via ORAL
  Filled 2019-08-19 (×17): qty 1

## 2019-08-19 MED ORDER — HYDROMORPHONE HCL 2 MG PO TABS
ORAL_TABLET | ORAL | Status: AC
  Administered 2019-08-19: 2 mg via ORAL
  Filled 2019-08-19: qty 1

## 2019-08-19 MED ORDER — HYDROMORPHONE HCL 2 MG PO TABS
1.0000 mg | ORAL_TABLET | Freq: Once | ORAL | Status: AC
  Administered 2019-08-19 – 2019-08-20 (×2): 1 mg via ORAL
  Filled 2019-08-19: qty 1

## 2019-08-19 MED ORDER — GABAPENTIN 100 MG PO CAPS
100.0000 mg | ORAL_CAPSULE | Freq: Three times a day (TID) | ORAL | Status: DC
  Administered 2019-08-19 – 2019-08-24 (×17): 100 mg via ORAL
  Filled 2019-08-19 (×17): qty 1

## 2019-08-19 NOTE — Progress Notes (Signed)
Patient ID: Tina Watkins, female   DOB: 1965-03-30, 54 y.o.   MRN: [id number] [facility] Progress Note   Assessment/ Plan:   1.  Healthcare associated pneumonia: Status post completion of intravenous/oral antibiotics for suspected left lower lobe pneumonia-she remains afebrile with some worsening of leukocytosis overnight that is suspected to be from her acute gout flare.  Recent deconditioning after hospitalizations and initiation of HD for ESRD noted--awaiting SNF.  2. 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  Continue this on TTS dialysis schedule with dialysis today.  Right femoral HD catheter with essentially end-stage access situation due to multifocal CV stenosis. 3. Anemia: She denies any overt blood loss with gradually improving hemoglobin/hematocrit on ESA. 4. CKD-MBD: Calcium level acceptable with improving phosphorus; begin calcium acetate TIDAC. 5.  Acute gout flare: Continue with prednisone taper, colchicine and uric acid lowering therapy with allopurinol. 6. Hypotension: Continue midodrine for blood pressure support and monitor with UF on HD.  Subjective:   Reports that she is sleepy this morning and denies any chest pain or shortness of breath.   Objective:   BP 97/60 (BP Location: Right Arm)   Pulse 66   Temp 98.1 F (36.7 C)   Resp 16   Ht 4\' 11"  (1.499 m)   Wt 52.2 kg   SpO2 99%   BMI 23.24 kg/m   Physical Exam: Gen: Comfortably resting in hemodialysis CVS: Pulse regular rhythm, normal rate, S1 and S2 normal Resp: Diminished breath sounds over bases without distinct rales or rhonchi Abd: Soft, flat, nontender Ext: 1+ bilateral thigh edema with trace pretibial edema.  Right femoral HD catheter.  Labs: BMET Recent Labs  Lab 08/13/19 0331 08/14/19 0338 08/15/19 0355 08/16/19 0453 08/17/19 0358 08/18/19 0311 08/19/19 0502  NA 138 139 137 136 135 133* 134*  K 5.0 4.4 4.7 4.0 4.1 3.9 3.8  CL  105 102 98 100 98 98 98  CO2 22 25 24  20* 20* 22 22  GLUCOSE 107* 113* 112* 119* 113* 88 101*  BUN 40* 60* 75* 44* 62* 36* 52*  CREATININE 3.16* 4.60* 5.73* 3.86* 4.97* 3.73* 5.05*  CALCIUM 8.4* 9.0 9.4 8.6* 8.9 8.8* 8.6*  PHOS 2.9 4.1 5.6* 4.9* 5.6* 4.7* 5.6*   CBC Recent Labs  Lab 08/16/19 0453 08/17/19 0358 08/18/19 0311 08/19/19 0502  WBC 7.8 10.6* 11.4* 10.5  NEUTROABS 6.6 9.0* 9.4* 9.2*  HGB 8.2* 8.6* 9.0* 9.2*  HCT 26.8* 27.6* 27.9* 29.2*  MCV 103.1* 101.5* 103.3* 103.2*  PLT 108* 125* 124* 115*      Medications:    . sodium chloride   Intravenous Once  . allopurinol  100 mg Oral Daily  . aspirin EC  81 mg Oral Daily  . Chlorhexidine Gluconate Cloth  6 each Topical Q0600  . colchicine  0.3 mg Oral Daily  . darbepoetin (ARANESP) injection - DIALYSIS  100 mcg Intravenous Q Tue-HD  . famotidine  20 mg Oral Daily  . feeding supplement (NEPRO CARB STEADY)  237 mL Oral BID BM  . feeding supplement (PRO-STAT SUGAR FREE 64)  30 mL Oral BID  . gabapentin  100 mg Oral BID  . guaiFENesin  600 mg Oral BID  . heparin  5,000 Units Subcutaneous Q12H  . latanoprost  1 drop Left Eye QHS  . midodrine  10 mg Oral BID WC  . multivitamin  1 tablet Oral QHS  . predniSONE  20 mg Oral Q breakfast  . tacrolimus  2  mg Oral QHS  . tacrolimus  3 mg Oral q morning - 10a  . Vitamin D (Ergocalciferol)  50,000 Units Oral Q Glennie Hawk, MD 08/19/2019, 8:23 AM

## 2019-08-19 NOTE — Progress Notes (Signed)
Progress Note    Tina Watkins  QJJ:[id number] DOB: May 06, 1965  DOA: 08/05/2019 PCP: Tina Glatter, FNP    Brief Narrative:     Medical records reviewed and are as summarized below:  Tina Watkins is an 54 y.o. female with history of ESRD status post renal transplant complicated by CKD stage IV on chronic steroids and immunosuppressive therapy, chronic anemia secondary to CKD, hypertension, TIA, chronic diastolic congestive heart failure, moderate pulmonary hypertension who presented with multiple joints pain which significantly limited her mobility. Symptoms started about a week ago. It involved right wrist and several fingers and right elbow and bilateral knees and ankles. She also had low-grade fever. Patient also developed productive cough with yellow sputum. She was started on hemodialysis this June because of failed kidney transplant.She has a right-sided femoral HD catheter for dialysis. On presentation, chest x-ray showed possible left lower lobe pneumonia. Admitted for further management.Startedon IV antibiotics and being also managed for acute gout flare.Hospital course remained stable.  PT recommended SNF. Pending placement  Assessment/Plan:   Active Problems:   HCAP (healthcare-associated pneumonia)   Decubitus ulcer of sacral region, stage 1   Healthcare associated pneumonia: Presented with cough, new infiltrate on the chest x-ray.Leukocytosis.started on vancomycin and Zosyn.Elevated procalcitonin. Presented with fever. Since MRSA PCR is negative, vancomycin stopped.  Afebrile now.Cultures have remained negative. Her respiratory status is stable. Currently on room air. Antibiotics changed to oral Augmentin which she completed course on 08/11/2019.  ESRD on hemodialysis/failed renal transplant x3:Nephrology following. She has right-sided femoral dialysis catheter. She has extremely limited IV access on the upper extremities.  She is following with vascular surgery as an outpatient for permanent access. Patient still produces urine.  She gets dialyzed on TTS schedule.  Acute gout flareup: Presented with pain on the multiple joints, more pronounced in bilateral ankle and right thumb.  Colchicine restarted due to severe pain. Dose of steroid has been increased from her baseline of 5 mg p.o. daily to 20 mg p.o. daily which was increased to twice daily on 08/12/2019 and patient continues to feel better. Her ankle pain and right thumb pain has resolved.. Her right thumb is back to normal and is nontender.  Continue with tapering dose of prednisone:  20 mg on 08/19/2019 for 3 days and then 10 mg 3 days and then back to 5 mg, her home dose.  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 Her nephrologist is still recommending to continue steroid and tacrolimus. Imuran was discontinued last month due to worsening pancytopenia.  Chronic  hypotension:On midodrine. BP soft. She has chronic hypotension.  Leukocytosis:Leukocytosis most likely associated with her acute gout flareup.resolved  Anemia of chronic disease/thrombocytopenia: Chronic microcytosis.  B12 and folate were normal.     Chronic diastolic heart failure: Volume managed by dialysis. Last echo on 12/20 showed restrictive cardiomyopathy, grade 3 diastolic dysfunction, preserved ejection fraction, moderate pulmonary hypertension  History of Covid infection: - April 2021. Presented with Covid pneumonia and had prolonged hospitalization.  Debility/deconditioning: She is severely deconditioned due to her debilitating chronic gouty arthritis. PT recommended skilled nursing facility on discharge.  Patient was confident that she has progressed and now she might be able to go home however upon reevaluation by PT , they still believe that patient will benefit from SNF.  TOC working on placement.  Placement has been difficult due  to her having Medicaid insurance and requiring hemodialysis transport.   Pressure Injury 08/05/19 Sacrum  Mid;Medial Stage 2 -  Partial thickness loss of dermis presenting as a shallow open injury with a red, pink wound bed without slough. (Active)  08/05/19 1900  Location: Sacrum  Location Orientation: Mid;Medial  Staging: Stage 2 -  Partial thickness loss of dermis presenting as a shallow open injury with a red, pink wound bed without slough.  Wound Description (Comments):   Present on Admission:   -c/o burning pain since Feb- will increase neurontin     Family Communication/Anticipated D/C date and plan/Code Status   DVT prophylaxis: refusing heparin-- long discussion regarding the risk of not being on heparin- scds placed Code Status: Full Code.  Disposition Plan: Status is: Inpatient  Remains inpatient appropriate because:Unsafe d/c plan   Dispo:  Patient From: Home  Planned Disposition: snf  Expected discharge date: 08/16/19  Medically stable for discharge: Yes          Medical Consultants:    renal.    Subjective:   C/o burning on her bottom  Objective:    Vitals:   08/19/19 1145 08/19/19 1150 08/19/19 1200 08/19/19 1246  BP: (!) 68/38 (!) 70/38 (!) 96/41 (!) 90/51  Pulse: 76 80 84 70  Resp: 16  19 14   Temp:    98.2 F (36.8 C)  TempSrc:    Oral  SpO2:    96%  Weight:    48 kg  Height:        Intake/Output Summary (Last 24 hours) at 08/19/2019 1329 Last data filed at 08/19/2019 1246 Gross per 24 hour  Intake 480 ml  Output 2281 ml  Net -1801 ml   Filed Weights   08/18/19 0500 08/19/19 0744 08/19/19 1246  Weight: 52.2 kg 51 kg 48 kg    Exam:  General: Appearance:    Chronically ill appearing female in no acute distress- eating lunch     Lungs:     Clear to auscultation bilaterally, respirations unlabored  Heart:    Normal heart rate. Normal rhythm. No murmurs, rubs, or gallops.      Neurologic:   Awake, alert, oriented x 3. No  apparent focal neurological           defect.     Data Reviewed:   I have personally reviewed following labs and imaging studies:  Labs: Labs show the following:   Basic Metabolic Panel: Recent Labs  Lab 08/15/19 0355 08/15/19 0355 08/16/19 0453 08/16/19 0453 08/17/19 0358 08/17/19 0358 08/18/19 0311 08/19/19 0502  NA 137  --  136  --  135  --  133* 134*  K 4.7   < > 4.0   < > 4.1   < > 3.9 3.8  CL 98  --  100  --  98  --  98 98  CO2 24  --  20*  --  20*  --  22 22  GLUCOSE 112*  --  119*  --  113*  --  88 101*  BUN 75*  --  44*  --  62*  --  36* 52*  CREATININE 5.73*  --  3.86*  --  4.97*  --  3.73* 5.05*  CALCIUM 9.4  --  8.6*  --  8.9  --  8.8* 8.6*  PHOS 5.6*  --  4.9*  --  5.6*  --  4.7* 5.6*   < > = values in this interval not displayed.   GFR Estimated Creatinine Clearance: 9 mL/min (A) (by C-G formula based on SCr of 5.05  mg/dL (H)). Liver Function Tests: Recent Labs  Lab 08/15/19 0355 08/16/19 0453 08/17/19 0358 08/18/19 0311 08/19/19 0502  ALBUMIN 3.0* 2.8* 2.9* 2.8* 2.9*   No results for input(s): LIPASE, AMYLASE in the last 168 hours. No results for input(s): AMMONIA in the last 168 hours. Coagulation profile No results for input(s): INR, PROTIME in the last 168 hours.  CBC: Recent Labs  Lab 08/15/19 0355 08/16/19 0453 08/17/19 0358 08/18/19 0311 08/19/19 0502  WBC 8.0 7.8 10.6* 11.4* 10.5  NEUTROABS 6.9 6.6 9.0* 9.4* 9.2*  HGB 8.7* 8.2* 8.6* 9.0* 9.2*  HCT 26.7* 26.8* 27.6* 27.9* 29.2*  MCV 100.4* 103.1* 101.5* 103.3* 103.2*  PLT 130* 108* 125* 124* 115*   Cardiac Enzymes: No results for input(s): CKTOTAL, CKMB, CKMBINDEX, TROPONINI in the last 168 hours. BNP (last 3 results) No results for input(s): PROBNP in the last 8760 hours. CBG: No results for input(s): GLUCAP in the last 168 hours. D-Dimer: No results for input(s): DDIMER in the last 72 hours. Hgb A1c: No results for input(s): HGBA1C in the last 72 hours. Lipid  Profile: No results for input(s): CHOL, HDL, LDLCALC, TRIG, CHOLHDL, LDLDIRECT in the last 72 hours. Thyroid function studies: No results for input(s): TSH, T4TOTAL, T3FREE, THYROIDAB in the last 72 hours.  Invalid input(s): FREET3 Anemia work up: No results for input(s): VITAMINB12, FOLATE, FERRITIN, TIBC, IRON, RETICCTPCT in the last 72 hours. Sepsis Labs: Recent Labs  Lab 08/16/19 0453 08/17/19 0358 08/18/19 0311 08/19/19 0502  WBC 7.8 10.6* 11.4* 10.5    Microbiology No results found for this or any previous visit (from the past 240 hour(s)).  Procedures and diagnostic studies:  No results found.  Medications:   . sodium chloride   Intravenous Once  . allopurinol  100 mg Oral Daily  . aspirin EC  81 mg Oral Daily  . calcium acetate  667 mg Oral TID WC  . Chlorhexidine Gluconate Cloth  6 each Topical Q0600  . colchicine  0.3 mg Oral Daily  . darbepoetin (ARANESP) injection - DIALYSIS  100 mcg Intravenous Q Tue-HD  . famotidine  20 mg Oral Daily  . feeding supplement (NEPRO CARB STEADY)  237 mL Oral BID BM  . feeding supplement (PRO-STAT SUGAR FREE 64)  30 mL Oral BID  . gabapentin  100 mg Oral BID  . guaiFENesin  600 mg Oral BID  . heparin  5,000 Units Subcutaneous Q12H  . latanoprost  1 drop Left Eye QHS  . midodrine  10 mg Oral BID WC  . multivitamin  1 tablet Oral QHS  . predniSONE  20 mg Oral Q breakfast  . tacrolimus  2 mg Oral QHS  . tacrolimus  3 mg Oral q morning - 10a  . Vitamin D (Ergocalciferol)  50,000 Units Oral Q Sun   Continuous Infusions:   LOS: 14 days   Geradine Girt  [facility]   How to contact the [facility] Attending or Consulting provider [place] or covering provider during after hours [place], for this patient?  1. Check the care team in [facility] and look for a) attending/consulting TRH provider listed and b) the [facility] team listed 2. Log into [email or web address] and use [place]'s universal password to access. If you do not have the password,  please contact the hospital operator. 3. Locate the [facility] provider you are looking for under [facility] and page to a number that you can be directly reached. 4. If you still have difficulty reaching the  provider, please page the [facility] (Director on Call) for the Hospitalists listed on amion for assistance.  08/19/2019, 1:29 PM

## 2019-08-20 LAB — RENAL FUNCTION PANEL
Albumin: 2.8 g/dL — ABNORMAL LOW (ref 3.5–5.0)
Anion gap: 9 (ref 5–15)
BUN: 32 mg/dL — ABNORMAL HIGH (ref 6–20)
CO2: 25 mmol/L (ref 22–32)
Calcium: 8.6 mg/dL — ABNORMAL LOW (ref 8.9–10.3)
Chloride: 96 mmol/L — ABNORMAL LOW (ref 98–111)
Creatinine, Ser: 3.55 mg/dL — ABNORMAL HIGH (ref 0.44–1.00)
GFR calc Af Amer: 16 mL/min — ABNORMAL LOW (ref >60)
GFR calc non Af Amer: 14 mL/min — ABNORMAL LOW (ref >60)
Glucose, Bld: 112 mg/dL — ABNORMAL HIGH (ref 70–99)
Phosphorus: 4.4 mg/dL (ref 2.5–4.6)
Potassium: 4.3 mmol/L (ref 3.5–5.1)
Sodium: 130 mmol/L — ABNORMAL LOW (ref 135–145)

## 2019-08-20 LAB — CBC WITH DIFFERENTIAL/PLATELET
Abs Immature Granulocytes: 0.16 10*3/uL — ABNORMAL HIGH (ref 0.00–0.07)
Basophils Absolute: 0 10*3/uL (ref 0.0–0.1)
Basophils Relative: 0 %
Eosinophils Absolute: 0 10*3/uL (ref 0.0–0.5)
Eosinophils Relative: 0 %
HCT: 27.2 % — ABNORMAL LOW (ref 36.0–46.0)
Hemoglobin: 8.5 g/dL — ABNORMAL LOW (ref 12.0–15.0)
Immature Granulocytes: 2 %
Lymphocytes Relative: 2 %
Lymphs Abs: 0.2 10*3/uL — ABNORMAL LOW (ref 0.7–4.0)
MCH: 32.1 pg (ref 26.0–34.0)
MCHC: 31.3 g/dL (ref 30.0–36.0)
MCV: 102.6 fL — ABNORMAL HIGH (ref 80.0–100.0)
Monocytes Absolute: 0.7 10*3/uL (ref 0.1–1.0)
Monocytes Relative: 6 %
Neutro Abs: 9.7 10*3/uL — ABNORMAL HIGH (ref 1.7–7.7)
Neutrophils Relative %: 90 %
Platelets: 103 10*3/uL — ABNORMAL LOW (ref 150–400)
RBC: 2.65 MIL/uL — ABNORMAL LOW (ref 3.87–5.11)
RDW: 23.5 % — ABNORMAL HIGH (ref 11.5–15.5)
WBC: 10.7 10*3/uL — ABNORMAL HIGH (ref 4.0–10.5)
nRBC: 0.4 % — ABNORMAL HIGH (ref 0.0–0.2)

## 2019-08-20 LAB — PATHOLOGIST SMEAR REVIEW

## 2019-08-20 NOTE — Progress Notes (Signed)
Progress Note    Tina Watkins  IOX:[id number] DOB: 12/06/1965  DOA: 08/05/2019 PCP: [name], FNP    Brief Narrative:     Medical records reviewed and are as summarized below:  Tina Watkins is an 54 y.o. female with history of ESRD status post renal transplant complicated by CKD stage IV on chronic steroids and immunosuppressive therapy, chronic anemia secondary to CKD, hypertension, TIA, chronic diastolic congestive heart failure, moderate pulmonary hypertension who presented with multiple joints pain which significantly limited her mobility. Symptoms started about a week ago. It involved right wrist and several fingers and right elbow and bilateral knees and ankles. She also had low-grade fever. Patient also developed productive cough with yellow sputum. She was started on hemodialysis this June because of failed kidney transplant.She has a right-sided femoral HD catheter for dialysis. On presentation, chest x-ray showed possible left lower lobe pneumonia. Admitted for further management.Startedon IV antibiotics and being also managed for acute gout flare.Hospital course remained stable.  PT recommended SNF. Pending placement  Assessment/Plan:   Active Problems:   HCAP (healthcare-associated pneumonia)   Decubitus ulcer of sacral region, stage 1   Healthcare associated pneumonia: Presented with cough, new infiltrate on the chest x-ray.Leukocytosis.started on vancomycin and Zosyn.Elevated procalcitonin. Presented with fever. Since MRSA PCR is negative, vancomycin stopped.  Afebrile now.Cultures have remained negative. Her respiratory status is stable. Currently on room air. Antibiotics changed to oral Augmentin which she completed course on 08/11/2019.  ESRD on hemodialysis/failed renal transplant x3:Nephrology following. She has right-sided femoral dialysis catheter. She has extremely limited IV access on the upper extremities.  She is following with vascular surgery as an outpatient for permanent access. Patient still produces urine.  She gets dialyzed on TTS schedule.  Acute gout flareup: Presented with pain on the multiple joints, more pronounced in bilateral ankle and right thumb.  Colchicine restarted due to severe pain. Dose of steroid has been increased from her baseline of 5 mg p.o. daily to 20 mg p.o. daily which was increased to twice daily on 08/12/2019 and patient continues to feel better. Her ankle pain and right thumb pain has resolved.. Her right thumb is back to normal and is nontender.  Continue with tapering dose of prednisone:  20 mg on 08/19/2019 for 3 days and then 10 mg 3 days and then back to 5 mg, her home dose.  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 Her nephrologist is still recommending to continue steroid and tacrolimus. Imuran was discontinued last month due to worsening pancytopenia.  Chronic  hypotension:On midodrine. BP soft. She has chronic hypotension.  Leukocytosis:Leukocytosis most likely associated with her acute gout flareup.resolved  Anemia of chronic disease/thrombocytopenia: Chronic microcytosis.  B12 and folate were normal.     Chronic diastolic heart failure: Volume managed by dialysis. Last echo on 12/20 showed restrictive cardiomyopathy, grade 3 diastolic dysfunction, preserved ejection fraction, moderate pulmonary hypertension  History of Covid infection: - April 2021. Presented with Covid pneumonia and had prolonged hospitalization.  Debility/deconditioning: She is severely deconditioned due to her debilitating chronic gouty arthritis. PT recommended skilled nursing facility on discharge.  Patient was confident that she has progressed and now she might be able to go home however upon reevaluation by PT , they still believe that patient will benefit from SNF.  TOC working on placement.  Placement has been difficult due  to her having Medicaid insurance and requiring hemodialysis transport.   Pressure Injury 08/05/19 Sacrum  Mid;Medial Stage 2 -  Partial thickness loss of dermis presenting as a shallow open injury with a red, pink wound bed without slough. (Active)  08/05/19 1900  Location: Sacrum  Location Orientation: Mid;Medial  Staging: Stage 2 -  Partial thickness loss of dermis presenting as a shallow open injury with a red, pink wound bed without slough.  Wound Description (Comments):   Present on Admission:   -c/o burning pain since Feb- will increase neurontin -HSV swab on small lesion on right labia    Family Communication/Anticipated D/C date and plan/Code Status   DVT prophylaxis: refusing heparin-- long discussion regarding the risk of not being on heparin- scds placed Code Status: Full Code.  Disposition Plan: Status is: Inpatient  Remains inpatient appropriate because:Unsafe d/c plan   Dispo:  Patient From: Home  Planned Disposition: snf  Expected discharge date: 08/16/19  Medically stable for discharge: Yes          Medical Consultants:    renal.    Subjective:   Still having some burning   Objective:    Vitals:   08/19/19 1200 08/19/19 1246 08/19/19 2031 08/20/19 0739  BP: (!) 96/41 (!) 90/51 (!) 91/53 (!) 90/56  Pulse: 84 70 75 65  Resp: 19 14 16 16   Temp:  98.2 F (36.8 C) 98.5 F (36.9 C) 98.2 F (36.8 C)  TempSrc:  Oral Oral   SpO2:  96% 100% 99%  Weight:  48 kg    Height:        Intake/Output Summary (Last 24 hours) at 08/20/2019 1238 Last data filed at 08/19/2019 1246 Gross per 24 hour  Intake --  Output 2281 ml  Net -2281 ml   Filed Weights   08/18/19 0500 08/19/19 0744 08/19/19 1246  Weight: 52.2 kg 51 kg 48 kg    Exam:  General: Appearance:    Chronically ill female in no acute distress     Lungs:      respirations unlabored  Heart:    Normal heart rate. Normal rhythm. No murmurs, rubs, or gallops.   Skin: Small ulcer on left  labia near crease with thigh  Neurologic:   Awake, alert, oriented x 3.      Data Reviewed:   I have personally reviewed following labs and imaging studies:  Labs: Labs show the following:   Basic Metabolic Panel: Recent Labs  Lab 08/16/19 0453 08/16/19 0453 08/17/19 0358 08/17/19 0358 08/18/19 0311 08/18/19 0311 08/19/19 0502 08/20/19 0306  NA 136  --  135  --  133*  --  134* 130*  K 4.0   < > 4.1   < > 3.9   < > 3.8 4.3  CL 100  --  98  --  98  --  98 96*  CO2 20*  --  20*  --  22  --  22 25  GLUCOSE 119*  --  113*  --  88  --  101* 112*  BUN 44*  --  62*  --  36*  --  52* 32*  CREATININE 3.86*  --  4.97*  --  3.73*  --  5.05* 3.55*  CALCIUM 8.6*  --  8.9  --  8.8*  --  8.6* 8.6*  PHOS 4.9*  --  5.6*  --  4.7*  --  5.6* 4.4   < > = values in this interval not displayed.   GFR Estimated Creatinine Clearance: 12.8 mL/min (A) (by C-G formula based on SCr of 3.55  mg/dL (H)). Liver Function Tests: Recent Labs  Lab 08/16/19 0453 08/17/19 0358 08/18/19 0311 08/19/19 0502 08/20/19 0306  ALBUMIN 2.8* 2.9* 2.8* 2.9* 2.8*   No results for input(s): LIPASE, AMYLASE in the last 168 hours. No results for input(s): AMMONIA in the last 168 hours. Coagulation profile No results for input(s): INR, PROTIME in the last 168 hours.  CBC: Recent Labs  Lab 08/16/19 0453 08/17/19 0358 08/18/19 0311 08/19/19 0502 08/20/19 0306  WBC 7.8 10.6* 11.4* 10.5 10.7*  NEUTROABS 6.6 9.0* 9.4* 9.2* 9.7*  HGB 8.2* 8.6* 9.0* 9.2* 8.5*  HCT 26.8* 27.6* 27.9* 29.2* 27.2*  MCV 103.1* 101.5* 103.3* 103.2* 102.6*  PLT 108* 125* 124* 115* 103*   Cardiac Enzymes: No results for input(s): CKTOTAL, CKMB, CKMBINDEX, TROPONINI in the last 168 hours. BNP (last 3 results) No results for input(s): PROBNP in the last 8760 hours. CBG: No results for input(s): GLUCAP in the last 168 hours. D-Dimer: No results for input(s): DDIMER in the last 72 hours. Hgb A1c: No results for input(s): HGBA1C in  the last 72 hours. Lipid Profile: No results for input(s): CHOL, HDL, LDLCALC, TRIG, CHOLHDL, LDLDIRECT in the last 72 hours. Thyroid function studies: No results for input(s): TSH, T4TOTAL, T3FREE, THYROIDAB in the last 72 hours.  Invalid input(s): FREET3 Anemia work up: No results for input(s): VITAMINB12, FOLATE, FERRITIN, TIBC, IRON, RETICCTPCT in the last 72 hours. Sepsis Labs: Recent Labs  Lab 08/17/19 0358 08/18/19 0311 08/19/19 0502 08/20/19 0306  WBC 10.6* 11.4* 10.5 10.7*    Microbiology No results found for this or any previous visit (from the past 240 hour(s)).  Procedures and diagnostic studies:  No results found.  Medications:   . sodium chloride   Intravenous Once  . allopurinol  100 mg Oral Daily  . aspirin EC  81 mg Oral Daily  . calcium acetate  667 mg Oral TID WC  . Chlorhexidine Gluconate Cloth  6 each Topical Q0600  . colchicine  0.3 mg Oral Daily  . darbepoetin (ARANESP) injection - DIALYSIS  100 mcg Intravenous Q Tue-HD  . famotidine  20 mg Oral Daily  . feeding supplement (NEPRO CARB STEADY)  237 mL Oral BID BM  . feeding supplement (PRO-STAT SUGAR FREE 64)  30 mL Oral BID  . gabapentin  100 mg Oral TID  . guaiFENesin  600 mg Oral BID  . latanoprost  1 drop Left Eye QHS  . midodrine  10 mg Oral BID WC  . multivitamin  1 tablet Oral QHS  . predniSONE  20 mg Oral Q breakfast  . tacrolimus  2 mg Oral QHS  . tacrolimus  3 mg Oral q morning - 10a  . Vitamin D (Ergocalciferol)  50,000 Units Oral Q Sun   Continuous Infusions:   LOS: 15 days   Geradine Girt  [facility]   How to contact the [facility] Attending or Consulting provider [place] or covering provider during after hours [place], for this patient?  1. Check the care team in [facility] and look for a) attending/consulting TRH provider listed and b) the [facility] team listed 2. Log into [email or web address] and use [place]'s universal password to access. If you do not have the password, please contact  the hospital operator. 3. Locate the [facility] provider you are looking for under [facility] and page to a number that you can be directly reached. 4. If you still have difficulty reaching the provider, please page the [facility] (Director on Call)  for the Hospitalists listed on amion for assistance.  08/20/2019, 12:38 PM

## 2019-08-20 NOTE — Progress Notes (Signed)
Patient ID: Tina Watkins, female   DOB: Jun 27, 1965, 54 y.o.   MRN: [id number] [facility] Progress Note   Assessment/ Plan:   1.  Healthcare associated pneumonia: Status post completion of intravenous/oral antibiotics for suspected left lower lobe pneumonia-clinically doing better and remains afebrile/asymptomatic at this time.  Recent deconditioning after hospitalizations and initiation of HD for ESRD noted--awaiting SNF for continued outpatient OT/PT.  2. 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  We will continue with dialysis on a TTS schedule-Next dialysis 7/20.  Right femoral HD catheter with essentially end-stage access situation due to multifocal CV stenosis. 3. Anemia: She denies any overt blood loss with gradually improving hemoglobin/hematocrit on ESA. 4. CKD-MBD: Calcium level acceptable with improving phosphorus; begin calcium acetate TIDAC. 5.  Acute gout flare: Continue with prednisone taper, colchicine and uric acid lowering therapy with allopurinol. 6. Hypotension: Continue midodrine for blood pressure support and monitor with UF on HD.  Subjective:   Reports that she had a comfortable night and overall continues to feel well with ultrafiltration.   Objective:   BP (!) 90/56 (BP Location: Left Arm)   Pulse 65   Temp 98.2 F (36.8 C)   Resp 16   Ht 4\' 11"  (1.499 m)   Wt 48 kg   SpO2 99%   BMI 21.37 kg/m   Physical Exam: Gen: Comfortably resting in hemodialysis CVS: Pulse regular rhythm, normal rate, S1 and S2 normal Resp: Diminished breath sounds over bases without distinct rales or rhonchi Abd: Soft, flat, nontender Ext: Trace bilateral thigh edema with trace ankle edema.  Right femoral HD catheter.  Labs: BMET Recent Labs  Lab 08/14/19 0338 08/15/19 0355 08/16/19 0453 08/17/19 0358 08/18/19 0311 08/19/19 0502 08/20/19 0306  NA 139 137 136 135 133* 134* 130*  K 4.4 4.7 4.0 4.1 3.9 3.8 4.3   CL 102 98 100 98 98 98 96*  CO2 25 24 20* 20* 22 22 25   GLUCOSE 113* 112* 119* 113* 88 101* 112*  BUN 60* 75* 44* 62* 36* 52* 32*  CREATININE 4.60* 5.73* 3.86* 4.97* 3.73* 5.05* 3.55*  CALCIUM 9.0 9.4 8.6* 8.9 8.8* 8.6* 8.6*  PHOS 4.1 5.6* 4.9* 5.6* 4.7* 5.6* 4.4   CBC Recent Labs  Lab 08/17/19 0358 08/18/19 0311 08/19/19 0502 08/20/19 0306  WBC 10.6* 11.4* 10.5 10.7*  NEUTROABS 9.0* 9.4* 9.2* 9.7*  HGB 8.6* 9.0* 9.2* 8.5*  HCT 27.6* 27.9* 29.2* 27.2*  MCV 101.5* 103.3* 103.2* 102.6*  PLT 125* 124* 115* 103*      Medications:    . sodium chloride   Intravenous Once  . allopurinol  100 mg Oral Daily  . aspirin EC  81 mg Oral Daily  . calcium acetate  667 mg Oral TID WC  . Chlorhexidine Gluconate Cloth  6 each Topical Q0600  . colchicine  0.3 mg Oral Daily  . darbepoetin (ARANESP) injection - DIALYSIS  100 mcg Intravenous Q Tue-HD  . famotidine  20 mg Oral Daily  . feeding supplement (NEPRO CARB STEADY)  237 mL Oral BID BM  . feeding supplement (PRO-STAT SUGAR FREE 64)  30 mL Oral BID  . gabapentin  100 mg Oral TID  . guaiFENesin  600 mg Oral BID  . latanoprost  1 drop Left Eye QHS  . midodrine  10 mg Oral BID WC  . multivitamin  1 tablet Oral QHS  . predniSONE  20 mg Oral Q breakfast  . tacrolimus  2 mg Oral  QHS  . tacrolimus  3 mg Oral q morning - 10a  . Vitamin D (Ergocalciferol)  50,000 Units Oral Q Glennie Hawk, MD 08/20/2019, 7:56 AM

## 2019-08-21 DIAGNOSIS — Z940 Kidney transplant status: Secondary | ICD-10-CM

## 2019-08-21 DIAGNOSIS — N186 End stage renal disease: Secondary | ICD-10-CM

## 2019-08-21 LAB — CBC WITH DIFFERENTIAL/PLATELET
Abs Immature Granulocytes: 0.09 10*3/uL — ABNORMAL HIGH (ref 0.00–0.07)
Basophils Absolute: 0 10*3/uL (ref 0.0–0.1)
Basophils Relative: 0 %
Eosinophils Absolute: 0 10*3/uL (ref 0.0–0.5)
Eosinophils Relative: 0 %
HCT: 25.9 % — ABNORMAL LOW (ref 36.0–46.0)
Hemoglobin: 8.1 g/dL — ABNORMAL LOW (ref 12.0–15.0)
Immature Granulocytes: 1 %
Lymphocytes Relative: 5 %
Lymphs Abs: 0.5 10*3/uL — ABNORMAL LOW (ref 0.7–4.0)
MCH: 31.8 pg (ref 26.0–34.0)
MCHC: 31.3 g/dL (ref 30.0–36.0)
MCV: 101.6 fL — ABNORMAL HIGH (ref 80.0–100.0)
Monocytes Absolute: 0.8 10*3/uL (ref 0.1–1.0)
Monocytes Relative: 8 %
Neutro Abs: 8.8 10*3/uL — ABNORMAL HIGH (ref 1.7–7.7)
Neutrophils Relative %: 86 %
Platelets: 97 10*3/uL — ABNORMAL LOW (ref 150–400)
RBC: 2.55 MIL/uL — ABNORMAL LOW (ref 3.87–5.11)
RDW: 23 % — ABNORMAL HIGH (ref 11.5–15.5)
WBC: 10.3 10*3/uL (ref 4.0–10.5)
nRBC: 0 % (ref 0.0–0.2)

## 2019-08-21 LAB — RENAL FUNCTION PANEL
Albumin: 3 g/dL — ABNORMAL LOW (ref 3.5–5.0)
Anion gap: 13 (ref 5–15)
BUN: 51 mg/dL — ABNORMAL HIGH (ref 6–20)
CO2: 24 mmol/L (ref 22–32)
Calcium: 9.2 mg/dL (ref 8.9–10.3)
Chloride: 94 mmol/L — ABNORMAL LOW (ref 98–111)
Creatinine, Ser: 4.82 mg/dL — ABNORMAL HIGH (ref 0.44–1.00)
GFR calc Af Amer: 11 mL/min — ABNORMAL LOW (ref >60)
GFR calc non Af Amer: 10 mL/min — ABNORMAL LOW (ref >60)
Glucose, Bld: 103 mg/dL — ABNORMAL HIGH (ref 70–99)
Phosphorus: 4.9 mg/dL — ABNORMAL HIGH (ref 2.5–4.6)
Potassium: 4 mmol/L (ref 3.5–5.1)
Sodium: 131 mmol/L — ABNORMAL LOW (ref 135–145)

## 2019-08-21 MED ORDER — CHLORHEXIDINE GLUCONATE CLOTH 2 % EX PADS
6.0000 | MEDICATED_PAD | Freq: Every day | CUTANEOUS | Status: DC
  Administered 2019-08-22: 6 via TOPICAL

## 2019-08-21 NOTE — Plan of Care (Signed)

## 2019-08-21 NOTE — Progress Notes (Signed)
Nutrition Follow up  DOCUMENTATION CODES:   Not applicable  INTERVENTION:   - Nepro Shake po BID, each supplement provides 425 kcal and 19 grams protein - 30 ml ProSource Plus BID, each supplement provides 100 kcals and 15 grams protein.  - Renal MVI daily  NUTRITION DIAGNOSIS:   Increased nutrient needs related to chronic illness (ESRD on HD, CHF) as evidenced by estimated needs.  Ongoing  GOAL:   Patient will meet greater than or equal to 90% of their needs   Progressing  MONITOR:   PO intake, Supplement acceptance, Labs, Weight trends, Skin, I & O's  REASON FOR ASSESSMENT:   Malnutrition Screening Tool    ASSESSMENT:   54 year old female who presented on 7/03 with fever, joint pain, weakness. PMH of ESRD s/p renal transplant complicated by CKD stage IV on chronic steroids and immunosuppressive therapy recently progressed to HD, chronic anemia secondary to CKD, HTN, TIA, CHF, pulmonary HTN. Admitted with HCAP.   Appetite stable. Does not like renal restrictions on diet. Last three meals completions charted as 60%, 50%, and 50%. Pt taking Nepro and Prosource BID. Weight trending down.   EDW: 57.5 kg  Admission weight: 57.9 kg  Current weight: 48 kg   Medications: phoslo, aranesp, rena-vit, Vit D Labs: Na 131 (L) Phosphorus 4.9 (wdl for HD) CBG 101-112  Diet Order:   Diet Order            Diet renal with fluid restriction Fluid restriction: 1200 mL Fluid; Room service appropriate? Yes; Fluid consistency: Thin  Diet effective now           Diet - low sodium heart healthy                 EDUCATION NEEDS:   No education needs have been identified at this time  Skin:  Skin Assessment: Skin Integrity Issues: Stage II: sacrum  Last BM:  7/19  Height:   Ht Readings from Last 1 Encounters:  08/05/19 4\' 11"  (1.499 m)    Weight:   Wt Readings from Last 1 Encounters:  08/19/19 48 kg    BMI:  Body mass index is 21.37 kg/m.  Estimated Nutritional  Needs:   Kcal:  1700-1900  Protein:  85-100 grams  Fluid:  1000 ml + UOP  Mariana Single RD, LDN Clinical Nutrition Pager listed in [place]

## 2019-08-21 NOTE — Progress Notes (Signed)
Patient ID: Tina Watkins, female   DOB: February 04, 1965, 54 y.o.   MRN: [id number] [facility] Progress Note   Assessment/ Plan:   1.  Healthcare associated pneumonia: Status post completion of intravenous/oral antibiotics for suspected left lower lobe pneumonia.  Recent deconditioning after hospitalizations and initiation of HD for ESRD noted--awaiting SNF for continued outpatient OT/PT.   2. 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  We will continue with dialysis on a TTS schedule.  Right femoral HD catheter with essentially end-stage access situation due to multifocal CV stenosis.  3. Anemia of ESRD: Continue ESA - aranesp 100 mcg every Tuesday for now  4. CKD-MBD: Calcium level acceptable with improving phosphorus; now on calcium acetate    5.  Acute gout flare: Continue with prednisone taper, colchicine and uric acid lowering therapy with allopurinol per primary team.  6. Hypotension: Continue midodrine for blood pressure support and monitor with UF on HD.   7. Immunosuppression - on immunosuppression (pred 5 mg daily and tacrolimus) with hx of renal transplant  Subjective:   States breathing is much improved.  Last HD on 7/17 with 2.3 kg UF.  Hasn't heard about SNF bed yet  Review of systems: Mild nausea no vomiting  Cough but better Denies shortness of breath or chest pain   Objective:   BP 118/67 (BP Location: Left Arm)   Pulse 77   Temp 98.4 F (36.9 C) (Oral)   Resp 14   Ht 4\' 11"  (1.499 m)   Wt 48 kg   SpO2 100%   BMI 21.37 kg/m   Physical Exam: Gen: adult female in bed in NAD CVS: S1 and S2 no rub Resp: Diminished breath sounds over bases; rhonchi clear with cough; unlabored Abd: Soft, flat, nontender Ext: Trace bilateral thigh edema with trace ankle edema.   Neuro alert and oriented x3 provides hx and follows commands Psych normal mood and affect Access: Right femoral HD  catheter.  Labs: BMET Recent Labs  Lab 08/15/19 0355 08/16/19 0453 08/17/19 0358 08/18/19 0311 08/19/19 0502 08/20/19 0306  NA 137 136 135 133* 134* 130*  K 4.7 4.0 4.1 3.9 3.8 4.3  CL 98 100 98 98 98 96*  CO2 24 20* 20* 22 22 25   GLUCOSE 112* 119* 113* 88 101* 112*  BUN 75* 44* 62* 36* 52* 32*  CREATININE 5.73* 3.86* 4.97* 3.73* 5.05* 3.55*  CALCIUM 9.4 8.6* 8.9 8.8* 8.6* 8.6*  PHOS 5.6* 4.9* 5.6* 4.7* 5.6* 4.4   CBC Recent Labs  Lab 08/17/19 0358 08/18/19 0311 08/19/19 0502 08/20/19 0306  WBC 10.6* 11.4* 10.5 10.7*  NEUTROABS 9.0* 9.4* 9.2* 9.7*  HGB 8.6* 9.0* 9.2* 8.5*  HCT 27.6* 27.9* 29.2* 27.2*  MCV 101.5* 103.3* 103.2* 102.6*  PLT 125* 124* 115* 103*      Medications:    . sodium chloride   Intravenous Once  . allopurinol  100 mg Oral Daily  . aspirin EC  81 mg Oral Daily  . calcium acetate  667 mg Oral TID WC  . Chlorhexidine Gluconate Cloth  6 each Topical Q0600  . colchicine  0.3 mg Oral Daily  . darbepoetin (ARANESP) injection - DIALYSIS  100 mcg Intravenous Q Tue-HD  . famotidine  20 mg Oral Daily  . feeding supplement (NEPRO CARB STEADY)  237 mL Oral BID BM  . feeding supplement (PRO-STAT SUGAR FREE 64)  30 mL Oral BID  . gabapentin  100 mg Oral TID  . guaiFENesin  600 mg Oral BID  . latanoprost  1 drop Left Eye QHS  . midodrine  10 mg Oral BID WC  . multivitamin  1 tablet Oral QHS  . predniSONE  20 mg Oral Q breakfast  . tacrolimus  2 mg Oral QHS  . tacrolimus  3 mg Oral q morning - 10a  . Vitamin D (Ergocalciferol)  50,000 Units Oral Q Kasandra Knudsen, MD 08/21/2019, 7:29 AM

## 2019-08-21 NOTE — Progress Notes (Signed)
Physical Therapy Treatment Patient Details Name: Tina Watkins MRN: [id number] DOB: 1965-08-28 Today's Date: 08/21/2019    History of Present Illness Patient is a 53-year female with history of ESRD status post renal transplant complicated by CKD stage IV on chronic steroids and immunosuppressive therapy, chronic anemia secondary to CKD, hypertension, TIA, chronic diastolic congestive heart failure, moderate pulmonary hypertension who presented with multiple joints pain which significantly limited her mobility.  Recently re-started on HD due to kidney failure, now with R femoral HD cath.    PT Comments    Patient received sitting at EOB eating breakfast- seems down today but willing to participate in therapy. See below for mobility/assist levels. Generally able to mobilize at an S level with RW today and able to progress gait distance significantly. Perseverating on why she can't just go home instead of to rehab. Discussed progress which actually is improving daily on a physical level- plan to re-evaluate for SNF vs home after HD tomorrow. Left sitting at EOB with all needs met this morning.     Follow Up Recommendations  Supervision/Assistance - 24 hour;SNF     Equipment Recommendations  None recommended by PT (refusing WC)    Recommendations for Other Services       Precautions / Restrictions Precautions Precautions: Fall Precaution Comments: R tunneled femoral HD cath (RN and pt report okay to sit/walk) Restrictions Weight Bearing Restrictions: No    Mobility  Bed Mobility               General bed mobility comments: siting at EOB  Transfers Overall transfer level: Needs assistance Equipment used: Rolling walker (2 wheeled) Transfers: Sit to/from Stand Sit to Stand: Supervision         General transfer comment: S for all transfers today with and without RW- just very slow and needed extended time  Ambulation/Gait Ambulation/Gait assistance:  Supervision Gait Distance (Feet): 150 Feet Assistive device: Rolling walker (2 wheeled) Gait Pattern/deviations: Step-through pattern;Trunk flexed;Wide base of support;Decreased step length - right;Decreased step length - left Gait velocity: decr   General Gait Details: gait continues to improve, but remains without good insight to deficits and energy management   Stairs             Wheelchair Mobility    Modified Rankin (Stroke Patients Only)       Balance Overall balance assessment: Needs assistance Sitting-balance support: Feet unsupported;Bilateral upper extremity supported Sitting balance-Leahy Scale: Good     Standing balance support: During functional activity;No upper extremity supported Standing balance-Leahy Scale: Good Standing balance comment: min guard for balance without BUE support                            Cognition Arousal/Alertness: Awake/alert Behavior During Therapy: WFL for tasks assessed/performed;Flat affect Overall Cognitive Status: Within Functional Limits for tasks assessed                           Safety/Judgement: Decreased awareness of safety     General Comments: cognition continues to improve, but still with impaired safety awareness- at one point left her RW to the side and walked across the room without it; seemed very flat and "down" today      Exercises      General Comments        Pertinent Vitals/Pain Pain Assessment: Faces Faces Pain Scale: Hurts a little bit Pain Location: B feet Pain  Descriptors / Indicators: Discomfort;Sore Pain Intervention(s): Limited activity within patient's tolerance;Monitored during session    Home Living                      Prior Function            PT Goals (current goals can now be found in the care plan section) Acute Rehab PT Goals Patient Stated Goal: go home PT Goal Formulation: With patient Time For Goal Achievement: 08/20/19 Potential to  Achieve Goals: Fair Progress towards PT goals: Progressing toward goals    Frequency    Min 3X/week      PT Plan Current plan remains appropriate    Co-evaluation              AM-PAC PT "6 Clicks" Mobility   Outcome Measure  Help needed turning from your back to your side while in a flat bed without using bedrails?: None Help needed moving from lying on your back to sitting on the side of a flat bed without using bedrails?: None Help needed moving to and from a bed to a chair (including a wheelchair)?: A Little Help needed standing up from a chair using your arms (e.g., wheelchair or bedside chair)?: A Little Help needed to walk in hospital room?: A Little Help needed climbing 3-5 steps with a railing? : A Little 6 Click Score: 20    End of Session   Activity Tolerance: Patient tolerated treatment well Patient left: in bed;with call bell/phone within reach (sitting at EOB)   PT Visit Diagnosis: Unsteadiness on feet (R26.81);Pain;Muscle weakness (generalized) (M62.81) Pain - Right/Left: Right Pain - part of body: Ankle and joints of foot     Time: 2493-2419 PT Time Calculation (min) (ACUTE ONLY): 14 min  Charges:  $Gait Training: 8-22 mins                     Windell Norfolk, DPT, PN1   Supplemental Physical Therapist [place]    Pager [phone] Acute Rehab Office [phone]

## 2019-08-21 NOTE — Progress Notes (Signed)
Progress Note    Tina Watkins  SNK:[id number] DOB: [birth year]  DOA: 08/05/2019 PCP: [name], FNP    Brief Narrative:     Medical records reviewed and are as summarized below:  Tina Watkins is an 54 y.o. female with history of ESRD status post renal transplant complicated by CKD stage IV on chronic steroids and immunosuppressive therapy, chronic anemia secondary to CKD, hypertension, TIA, chronic diastolic congestive heart failure, moderate pulmonary hypertension who presented with multiple joints pain which significantly limited her mobility. Symptoms started about a week ago. It involved right wrist and several fingers and right elbow and bilateral knees and ankles. She also had low-grade fever. Patient also developed productive cough with yellow sputum. She was started on hemodialysis this June because of failed kidney transplant.She has a right-sided femoral HD catheter for dialysis. On presentation, chest x-ray showed possible left lower lobe pneumonia. Admitted for further management.Startedon IV antibiotics and being also managed for acute gout flare.Hospital course remained stable.  PT recommended SNF. Pending placement   Assessment/Plan:   Active Problems:   History of renal transplantation   ESRD (end stage renal disease) ([place])   HCAP (healthcare-associated pneumonia)   Decubitus ulcer of sacral region, stage 1   Healthcare associated pneumonia: Presented with cough, new infiltrate on the chest x-ray.Leukocytosis.started on vancomycin and Zosyn.Elevated procalcitonin. Presented with fever. Since MRSA PCR is negative, vancomycin stopped.  Afebrile now.Cultures have remained negative. Her respiratory status is stable. Currently on room air. Antibiotics changed to oral Augmentin which she completed course on 08/11/2019.  ESRD on hemodialysis/failed renal transplant x3:Nephrology following. She has right-sided femoral  dialysis catheter. She has extremely limited IV access on the upper extremities. She is following with vascular surgery as an outpatient for permanent access. Patient still produces urine.  She gets dialyzed on TTS schedule.  Acute gout flareup: Presented with pain on the multiple joints, more pronounced in bilateral ankle and right thumb.  Colchicine restarted due to severe pain. Dose of steroid has been increased from her baseline of 5 mg p.o. daily to 20 mg p.o. daily which was increased to twice daily on 08/12/2019 and patient continues to feel better. Her ankle pain and right thumb pain has resolved.. Her right thumb is back to normal and is nontender.  Continue with tapering dose of prednisone:  20 mg on 08/19/2019 for 3 days and then 10 mg 3 days and then back to 5 mg, her home dose.  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 Her nephrologist is still recommending to continue steroid and tacrolimus. Imuran was discontinued last month due to worsening pancytopenia.  Chronic  hypotension:On midodrine. BP soft. She has chronic hypotension.  Leukocytosis:Leukocytosis most likely associated with her acute gout flareup.resolved  Anemia of chronic disease/thrombocytopenia: Chronic microcytosis.  B12 and folate were normal.     Chronic diastolic heart failure: Volume managed by dialysis. Last echo on 12/20 showed restrictive cardiomyopathy, grade 3 diastolic dysfunction, preserved ejection fraction, moderate pulmonary hypertension  History of Covid infection: - April 2021. Presented with Covid pneumonia and had prolonged hospitalization.  Debility/deconditioning: She is severely deconditioned due to her debilitating chronic gouty arthritis. PT recommended skilled nursing facility on discharge.  Patient was confident that she has progressed and now she might be able to go home however upon reevaluation by PT , they still believe that patient will  benefit from SNF.  TOC working on placement.  Placement has been difficult due  to her having Medicaid insurance and requiring hemodialysis transport.   Pressure Injury 08/05/19 Sacrum Mid;Medial Stage 2 -  Partial thickness loss of dermis presenting as a shallow open injury with a red, pink wound bed without slough. (Active)  08/05/19 1900  Location: Sacrum  Location Orientation: Mid;Medial  Staging: Stage 2 -  Partial thickness loss of dermis presenting as a shallow open injury with a red, pink wound bed without slough.  Wound Description (Comments):   Present on Admission:   -c/o burning pain since Feb- will increase neurontin -HSV swab on small lesion on leftlabia    Family Communication/Anticipated D/C date and plan/Code Status   DVT prophylaxis: refusing heparin-- long discussion regarding the risk of not being on heparin- scds placed Code Status: Full Code.  Disposition Plan: Status is: Inpatient  Remains inpatient appropriate because:Unsafe d/c plan   Dispo:  Patient From: Home  Planned Disposition: snf  Expected discharge date: 08/16/19  Medically stable for discharge: Yes          Medical Consultants:    renal.    Subjective:   Worked with PT.  C/o of her room smelling bad  Objective:    Vitals:   08/20/19 0739 08/20/19 1638 08/20/19 2205 08/21/19 0827  BP: (!) 90/56 (!) 95/51 118/67 (!) 94/58  Pulse: 65 71 77 68  Resp: 16 16 14 18   Temp: 98.2 F (36.8 C)  98.4 F (36.9 C) 98.1 F (36.7 C)  TempSrc:   Oral Oral  SpO2: 99% 100% 100% 100%  Weight:      Height:        Intake/Output Summary (Last 24 hours) at 08/21/2019 1239 Last data filed at 08/20/2019 1800 Gross per 24 hour  Intake 600 ml  Output --  Net 600 ml   Filed Weights   08/18/19 0500 08/19/19 0744 08/19/19 1246  Weight: 52.2 kg 51 kg 48 kg    Exam:  General: Appearance:    Well developed, well nourished female in no acute distress     Lungs:     Clear to auscultation  bilaterally, respirations unlabored  Heart:    Normal heart rate. Normal rhythm. No murmurs, rubs, or gallops.   MS:   All extremities are intact.   Neurologic:   Awake, alert, oriented x 3.       Data Reviewed:   I have personally reviewed following labs and imaging studies:  Labs: Labs show the following:   Basic Metabolic Panel: Recent Labs  Lab 08/16/19 0453 08/16/19 0453 08/17/19 0358 08/17/19 0358 08/18/19 0311 08/18/19 0311 08/19/19 0502 08/20/19 0306  NA 136  --  135  --  133*  --  134* 130*  K 4.0   < > 4.1   < > 3.9   < > 3.8 4.3  CL 100  --  98  --  98  --  98 96*  CO2 20*  --  20*  --  22  --  22 25  GLUCOSE 119*  --  113*  --  88  --  101* 112*  BUN 44*  --  62*  --  36*  --  52* 32*  CREATININE 3.86*  --  4.97*  --  3.73*  --  5.05* 3.55*  CALCIUM 8.6*  --  8.9  --  8.8*  --  8.6* 8.6*  PHOS 4.9*  --  5.6*  --  4.7*  --  5.6* 4.4   < > = values in  this interval not displayed.   GFR Estimated Creatinine Clearance: 12.8 mL/min (A) (by C-G formula based on SCr of 3.55 mg/dL (H)). Liver Function Tests: Recent Labs  Lab 08/16/19 0453 08/17/19 0358 08/18/19 0311 08/19/19 0502 08/20/19 0306  ALBUMIN 2.8* 2.9* 2.8* 2.9* 2.8*   No results for input(s): LIPASE, AMYLASE in the last 168 hours. No results for input(s): AMMONIA in the last 168 hours. Coagulation profile No results for input(s): INR, PROTIME in the last 168 hours.  CBC: Recent Labs  Lab 08/17/19 0358 08/18/19 0311 08/19/19 0502 08/20/19 0306 08/21/19 1044  WBC 10.6* 11.4* 10.5 10.7* 10.3  NEUTROABS 9.0* 9.4* 9.2* 9.7* 8.8*  HGB 8.6* 9.0* 9.2* 8.5* 8.1*  HCT 27.6* 27.9* 29.2* 27.2* 25.9*  MCV 101.5* 103.3* 103.2* 102.6* 101.6*  PLT 125* 124* 115* 103* 97*   Cardiac Enzymes: No results for input(s): CKTOTAL, CKMB, CKMBINDEX, TROPONINI in the last 168 hours. BNP (last 3 results) No results for input(s): PROBNP in the last 8760 hours. CBG: No results for input(s): GLUCAP in the  last 168 hours. D-Dimer: No results for input(s): DDIMER in the last 72 hours. Hgb A1c: No results for input(s): HGBA1C in the last 72 hours. Lipid Profile: No results for input(s): CHOL, HDL, LDLCALC, TRIG, CHOLHDL, LDLDIRECT in the last 72 hours. Thyroid function studies: No results for input(s): TSH, T4TOTAL, T3FREE, THYROIDAB in the last 72 hours.  Invalid input(s): FREET3 Anemia work up: No results for input(s): VITAMINB12, FOLATE, FERRITIN, TIBC, IRON, RETICCTPCT in the last 72 hours. Sepsis Labs: Recent Labs  Lab 08/18/19 0311 08/19/19 0502 08/20/19 0306 08/21/19 1044  WBC 11.4* 10.5 10.7* 10.3    Microbiology No results found for this or any previous visit (from the past 240 hour(s)).  Procedures and diagnostic studies:  No results found.  Medications:   . sodium chloride   Intravenous Once  . allopurinol  100 mg Oral Daily  . aspirin EC  81 mg Oral Daily  . calcium acetate  667 mg Oral TID WC  . Chlorhexidine Gluconate Cloth  6 each Topical Q0600  . colchicine  0.3 mg Oral Daily  . darbepoetin (ARANESP) injection - DIALYSIS  100 mcg Intravenous Q Tue-HD  . famotidine  20 mg Oral Daily  . feeding supplement (NEPRO CARB STEADY)  237 mL Oral BID BM  . feeding supplement (PRO-STAT SUGAR FREE 64)  30 mL Oral BID  . gabapentin  100 mg Oral TID  . guaiFENesin  600 mg Oral BID  . latanoprost  1 drop Left Eye QHS  . midodrine  10 mg Oral BID WC  . multivitamin  1 tablet Oral QHS  . tacrolimus  2 mg Oral QHS  . tacrolimus  3 mg Oral q morning - 10a  . Vitamin D (Ergocalciferol)  50,000 Units Oral Q Sun   Continuous Infusions:   LOS: 16 days   Geradine Girt  [facility]   How to contact the [facility] Attending or Consulting provider [place] or covering provider during after hours [place], for this patient?  1. Check the care team in [facility] and look for a) attending/consulting [facility] provider listed and b) the [facility] team listed 2. Log into [email or web address] and use [facility]'s universal password to access. If you do not have the password, please contact the hospital operator. 3. Locate the [facility] provider you are looking for under [facility] and page to a number that you can be directly reached. 4. If you still  have difficulty reaching the provider, please page the [facility] (Director on Call) for the Hospitalists listed on amion for assistance.  08/21/2019, 12:39 PM

## 2019-08-22 LAB — CBC WITH DIFFERENTIAL/PLATELET
Abs Immature Granulocytes: 0.12 10*3/uL — ABNORMAL HIGH (ref 0.00–0.07)
Basophils Absolute: 0 10*3/uL (ref 0.0–0.1)
Basophils Relative: 0 %
Eosinophils Absolute: 0 10*3/uL (ref 0.0–0.5)
Eosinophils Relative: 0 %
HCT: 24.9 % — ABNORMAL LOW (ref 36.0–46.0)
Hemoglobin: 8 g/dL — ABNORMAL LOW (ref 12.0–15.0)
Immature Granulocytes: 1 %
Lymphocytes Relative: 3 %
Lymphs Abs: 0.2 10*3/uL — ABNORMAL LOW (ref 0.7–4.0)
MCH: 32.3 pg (ref 26.0–34.0)
MCHC: 32.1 g/dL (ref 30.0–36.0)
MCV: 100.4 fL — ABNORMAL HIGH (ref 80.0–100.0)
Monocytes Absolute: 0.8 10*3/uL (ref 0.1–1.0)
Monocytes Relative: 9 %
Neutro Abs: 8.3 10*3/uL — ABNORMAL HIGH (ref 1.7–7.7)
Neutrophils Relative %: 87 %
Platelets: 91 10*3/uL — ABNORMAL LOW (ref 150–400)
RBC: 2.48 MIL/uL — ABNORMAL LOW (ref 3.87–5.11)
RDW: 22.7 % — ABNORMAL HIGH (ref 11.5–15.5)
WBC: 9.5 10*3/uL (ref 4.0–10.5)
nRBC: 0.2 % (ref 0.0–0.2)

## 2019-08-22 LAB — RENAL FUNCTION PANEL
Albumin: 2.8 g/dL — ABNORMAL LOW (ref 3.5–5.0)
Anion gap: 11 (ref 5–15)
BUN: 68 mg/dL — ABNORMAL HIGH (ref 6–20)
CO2: 24 mmol/L (ref 22–32)
Calcium: 8.7 mg/dL — ABNORMAL LOW (ref 8.9–10.3)
Chloride: 94 mmol/L — ABNORMAL LOW (ref 98–111)
Creatinine, Ser: 5.32 mg/dL — ABNORMAL HIGH (ref 0.44–1.00)
GFR calc Af Amer: 10 mL/min — ABNORMAL LOW (ref >60)
GFR calc non Af Amer: 9 mL/min — ABNORMAL LOW (ref >60)
Glucose, Bld: 98 mg/dL (ref 70–99)
Phosphorus: 5.1 mg/dL — ABNORMAL HIGH (ref 2.5–4.6)
Potassium: 4.6 mmol/L (ref 3.5–5.1)
Sodium: 129 mmol/L — ABNORMAL LOW (ref 135–145)

## 2019-08-22 LAB — HSV DNA BY PCR (REFERENCE LAB)
HSV 1 DNA: NEGATIVE
HSV 2 DNA: POSITIVE — AB

## 2019-08-22 MED ORDER — DARBEPOETIN ALFA 150 MCG/0.3ML IJ SOSY
PREFILLED_SYRINGE | INTRAMUSCULAR | Status: AC
  Administered 2019-08-22: 150 ug via INTRAVENOUS
  Filled 2019-08-22: qty 0.3

## 2019-08-22 MED ORDER — ALBUMIN HUMAN 25 % IV SOLN
INTRAVENOUS | Status: AC
  Filled 2019-08-22: qty 100

## 2019-08-22 MED ORDER — PREDNISONE 10 MG PO TABS
10.0000 mg | ORAL_TABLET | Freq: Every day | ORAL | Status: DC
  Administered 2019-08-22 – 2019-08-24 (×3): 10 mg via ORAL
  Filled 2019-08-22 (×3): qty 1

## 2019-08-22 MED ORDER — OXYMETAZOLINE HCL 0.05 % NA SOLN
1.0000 | Freq: Two times a day (BID) | NASAL | Status: DC | PRN
  Filled 2019-08-22: qty 30

## 2019-08-22 MED ORDER — ALBUMIN HUMAN 25 % IV SOLN
25.0000 g | Freq: Once | INTRAVENOUS | Status: AC
  Administered 2019-08-22: 25 g via INTRAVENOUS

## 2019-08-22 MED ORDER — ALBUMIN HUMAN 25 % IV SOLN: 25.0000 g | Freq: Once | INTRAVENOUS | Status: AC

## 2019-08-22 MED ORDER — MIDODRINE HCL 5 MG PO TABS
10.0000 mg | ORAL_TABLET | Freq: Once | ORAL | Status: AC
  Administered 2019-08-22: 10 mg via ORAL

## 2019-08-22 MED ORDER — DARBEPOETIN ALFA 150 MCG/0.3ML IJ SOSY
150.0000 ug | PREFILLED_SYRINGE | INTRAMUSCULAR | Status: DC
  Filled 2019-08-22: qty 0.3

## 2019-08-22 MED ORDER — HEPARIN SODIUM (PORCINE) 1000 UNIT/ML IJ SOLN
INTRAMUSCULAR | Status: AC
  Filled 2019-08-22: qty 1

## 2019-08-22 MED ORDER — MIDODRINE HCL 5 MG PO TABS
ORAL_TABLET | ORAL | Status: AC
  Filled 2019-08-22: qty 2

## 2019-08-22 MED ORDER — ALBUMIN HUMAN 25 % IV SOLN
INTRAVENOUS | Status: AC
  Administered 2019-08-22: 25 g via INTRAVENOUS
  Filled 2019-08-22: qty 100

## 2019-08-22 NOTE — Progress Notes (Signed)
Physical Therapy Treatment Patient Details Name: Tina Watkins MRN: [id number] DOB: [birth year] Today's Date: 08/22/2019    History of Present Illness Patient is a 27-year female with history of ESRD status post renal transplant complicated by CKD stage IV on chronic steroids and immunosuppressive therapy, chronic anemia secondary to CKD, hypertension, TIA, chronic diastolic congestive heart failure, moderate pulmonary hypertension who presented with multiple joints pain which significantly limited her mobility.  Recently re-started on HD due to kidney failure, now with R femoral HD cath.    PT Comments    Patient received in bed eating lunch, pleasant and willing to participate in session. Able to complete all functional mobility including gait with RW with general S and no significant impairments in safety awareness or balance. Continues to fatigue easily but this has improved significantly. Feel she could now go home with skilled HHPT services at this point as mobility and safety have improved very well. Left sitting at EOB with all needs met per her request, MD aware of functional status change.     Follow Up Recommendations  Home health PT     Equipment Recommendations  Rolling walker with 5" wheels    Recommendations for Other Services       Precautions / Restrictions Precautions Precautions: Fall Precaution Comments: R tunneled femoral HD cath (RN and pt report okay to sit/walk) Restrictions Weight Bearing Restrictions: No    Mobility  Bed Mobility Overal bed mobility: Needs Assistance Bed Mobility: Supine to Sit     Supine to sit: Supervision;HOB elevated     General bed mobility comments: S and extended time  Transfers Overall transfer level: Needs assistance Equipment used: Rolling walker (2 wheeled) Transfers: Sit to/from Stand Sit to Stand: Supervision         General transfer comment: S for all transfers today with and without RW- just very  slow and needed extended time  Ambulation/Gait Ambulation/Gait assistance: Supervision Gait Distance (Feet): 180 Feet Assistive device: Rolling walker (2 wheeled) Gait Pattern/deviations: Step-through pattern;Trunk flexed;Wide base of support;Decreased step length - right;Decreased step length - left Gait velocity: decr   General Gait Details: gait and functional activity tolerance much improved today!   Stairs             Wheelchair Mobility    Modified Rankin (Stroke Patients Only)       Balance Overall balance assessment: Needs assistance Sitting-balance support: Feet unsupported;Bilateral upper extremity supported Sitting balance-Leahy Scale: Good Sitting balance - Comments: able to put on socks with general S, no LOB   Standing balance support: During functional activity;No upper extremity supported Standing balance-Leahy Scale: Fair Standing balance comment: general S for safety without AD                            Cognition Arousal/Alertness: Awake/alert Behavior During Therapy: Flat affect Overall Cognitive Status: Within Functional Limits for tasks assessed                                 General Comments: generally WNL, safety awareness improving      Exercises      General Comments        Pertinent Vitals/Pain Pain Assessment: Faces Faces Pain Scale: No hurt Pain Intervention(s): Monitored during session;Limited activity within patient's tolerance    Home Living  Prior Function            PT Goals (current goals can now be found in the care plan section) Acute Rehab PT Goals Patient Stated Goal: go home PT Goal Formulation: With patient Time For Goal Achievement: 08/20/19 Potential to Achieve Goals: Fair Progress towards PT goals: Progressing toward goals    Frequency           PT Plan Discharge plan needs to be updated;Equipment recommendations need to be updated     Co-evaluation              AM-PAC PT "6 Clicks" Mobility   Outcome Measure  Help needed turning from your back to your side while in a flat bed without using bedrails?: None Help needed moving from lying on your back to sitting on the side of a flat bed without using bedrails?: None Help needed moving to and from a bed to a chair (including a wheelchair)?: A Little Help needed standing up from a chair using your arms (e.g., wheelchair or bedside chair)?: A Little Help needed to walk in hospital room?: A Little Help needed climbing 3-5 steps with a railing? : A Little 6 Click Score: 20    End of Session   Activity Tolerance: Patient tolerated treatment well Patient left: in bed;with call bell/phone within reach (sitting EOB per her request) Nurse Communication: Mobility status PT Visit Diagnosis: Unsteadiness on feet (R26.81);Pain;Muscle weakness (generalized) (M62.81) Pain - Right/Left: Right Pain - part of body: Ankle and joints of foot     Time: 1510-1526 PT Time Calculation (min) (ACUTE ONLY): 16 min  Charges:  $Gait Training: 8-22 mins                     Windell Norfolk, DPT, PN1   Supplemental Physical Therapist [place]    Pager [phone] Acute Rehab Office [phone]

## 2019-08-22 NOTE — Progress Notes (Signed)
Progress Note    Tina Watkins  WJX:[id number] DOB: Jun 08, 1965  DOA: 08/05/2019 PCP: [name], FNP    Brief Narrative:     Medical records reviewed and are as summarized below:  Tina Watkins is an 54 y.o. female with history of ESRD status post renal transplant complicated by CKD stage IV on chronic steroids and immunosuppressive therapy, chronic anemia secondary to CKD, hypertension, TIA, chronic diastolic congestive heart failure, moderate pulmonary hypertension who presented with multiple joints pain which significantly limited her mobility. Symptoms started about a week ago. It involved right wrist and several fingers and right elbow and bilateral knees and ankles. She also had low-grade fever. Patient also developed productive cough with yellow sputum. She was started on hemodialysis this June because of failed kidney transplant.She has a right-sided femoral HD catheter for dialysis. On presentation, chest x-ray showed possible left lower lobe pneumonia. Admitted for further management.Startedon IV antibiotics and being also managed for acute gout flare.Hospital course remained stable.  PT recommended SNF but patient may progress to home with continued therapy.   Assessment/Plan:   Active Problems:   History of renal transplantation   ESRD (end stage renal disease) ([place])   HCAP (healthcare-associated pneumonia)   Decubitus ulcer of sacral region, stage 1   Healthcare associated pneumonia: Presented with cough, new infiltrate on the chest x-ray.Leukocytosis.started on vancomycin and Zosyn.Elevated procalcitonin. Presented with fever. Since MRSA PCR is negative, vancomycin stopped.  Afebrile now.Cultures have remained negative. Her respiratory status is stable. Currently on room air. Antibiotics changed to oral Augmentin which she completed course on 08/11/2019.  ESRD on hemodialysis/failed renal transplant x3:Nephrology  following. She has right-sided femoral dialysis catheter. She has extremely limited IV access on the upper extremities. She is following with vascular surgery as an outpatient for permanent access. Patient still produces urine.  She gets dialyzed on TTS schedule.  Acute gout flareup: Presented with pain on the multiple joints, more pronounced in bilateral ankle and right thumb.  Colchicine restarted due to severe pain. Dose of steroid has been increased from her baseline of 5 mg p.o. daily to 20 mg p.o. daily which was increased to twice daily on 08/12/2019 and patient continues to feel better. Her ankle pain and right thumb pain has resolved.. Her right thumb is back to normal and is nontender.  Continue with tapering dose of prednisone:  20 mg on 08/19/2019 for 3 days and then 10 mg 3 days (7/20) and then back to 5 mg, her home dose  Failed kidney transplant: Recently started on dialysis in June this year. Her nephrologist is still recommending to continue steroid and tacrolimus. Imuran was discontinued last month due to worsening pancytopenia.  Chronic  hypotension:On midodrine. BP soft. She has chronic hypotension.  Leukocytosis:Leukocytosis most likely associated with her acute gout flareup.resolved  Anemia of chronic disease/thrombocytopenia: Chronic microcytosis.  B12 and folate were normal.     Chronic diastolic heart failure: Volume managed by dialysis. Last echo on 12/20 showed restrictive cardiomyopathy, grade 3 diastolic dysfunction, preserved ejection fraction, moderate pulmonary hypertension  History of Covid infection: - April 2021. Presented with Covid pneumonia and had prolonged hospitalization.  Debility/deconditioning: She is severely deconditioned due to her debilitating chronic gouty arthritis. PT recommended skilled nursing facility on discharge.  Patient was confident that she has progressed and now she might be able to go home however upon  reevaluation by PT , they still believe that patient will benefit from SNF.  TOC working  on placement.  Placement has been difficult due to her having Medicaid insurance and requiring hemodialysis transport.   Pressure Injury 08/05/19 Sacrum Mid;Medial Stage 2 -  Partial thickness loss of dermis presenting as a shallow open injury with a red, pink wound bed without slough. (Active)  08/05/19 1900  Location: Sacrum  Location Orientation: Mid;Medial  Staging: Stage 2 -  Partial thickness loss of dermis presenting as a shallow open injury with a red, pink wound bed without slough.  Wound Description (Comments):   Present on Admission:   -c/o burning pain since Feb- will increase neurontin -HSV swab on small lesion on left labia still pending    Family Communication/Anticipated D/C date and plan/Code Status   DVT prophylaxis: refusing heparin-- long discussion regarding the risk of not being on heparin- scds placed Code Status: Full Code.  Disposition Plan: Status is: Inpatient  Remains inpatient appropriate because:Unsafe d/c plan   Dispo:  Patient From: Home  Planned Disposition: snf  Expected discharge date: 7/21  Medically stable for discharge: Yes          Medical Consultants:    renal.    Subjective:   Had nosebleed this AM Slept well last night after medication  Objective:    Vitals:   08/22/19 1300 08/22/19 1330 08/22/19 1342 08/22/19 1425  BP: (!) 78/42 (!) 88/54 (!) 84/50 (!) 95/55  Pulse: 78 74 80 93  Resp:   16   Temp:   98.1 F (36.7 C)   TempSrc:   Oral   SpO2:   96%   Weight:   49.1 kg   Height:        Intake/Output Summary (Last 24 hours) at 08/22/2019 1501 Last data filed at 08/22/2019 1333 Gross per 24 hour  Intake 480 ml  Output 2495 ml  Net -2015 ml   Filed Weights   08/19/19 0744 08/19/19 1246 08/22/19 1342  Weight: 51 kg 48 kg 49.1 kg    Exam:   General: Appearance:    Chronically ill appearing female in no acute  distress     Lungs:     Clear to auscultation bilaterally, respirations unlabored  Heart:    Normal heart rate. Normal rhythm. No murmurs, rubs, or gallops.                 Data Reviewed:   I have personally reviewed following labs and imaging studies:  Labs: Labs show the following:   Basic Metabolic Panel: Recent Labs  Lab 08/18/19 0311 08/18/19 0311 08/19/19 0502 08/19/19 0502 08/20/19 0306 08/20/19 0306 08/21/19 1044 08/22/19 0321  NA 133*  --  134*  --  130*  --  131* 129*  K 3.9   < > 3.8   < > 4.3   < > 4.0 4.6  CL 98  --  98  --  96*  --  94* 94*  CO2 22  --  22  --  25  --  24 24  GLUCOSE 88  --  101*  --  112*  --  103* 98  BUN 36*  --  52*  --  32*  --  51* 68*  CREATININE 3.73*  --  5.05*  --  3.55*  --  4.82* 5.32*  CALCIUM 8.8*  --  8.6*  --  8.6*  --  9.2 8.7*  PHOS 4.7*  --  5.6*  --  4.4  --  4.9* 5.1*   < > = values in this  interval not displayed.   GFR Estimated Creatinine Clearance: 8.5 mL/min (A) (by C-G formula based on SCr of 5.32 mg/dL (H)). Liver Function Tests: Recent Labs  Lab 08/18/19 0311 08/19/19 0502 08/20/19 0306 08/21/19 1044 08/22/19 0321  ALBUMIN 2.8* 2.9* 2.8* 3.0* 2.8*   No results for input(s): LIPASE, AMYLASE in the last 168 hours. No results for input(s): AMMONIA in the last 168 hours. Coagulation profile No results for input(s): INR, PROTIME in the last 168 hours.  CBC: Recent Labs  Lab 08/18/19 0311 08/19/19 0502 08/20/19 0306 08/21/19 1044 08/22/19 0321  WBC 11.4* 10.5 10.7* 10.3 9.5  NEUTROABS 9.4* 9.2* 9.7* 8.8* 8.3*  HGB 9.0* 9.2* 8.5* 8.1* 8.0*  HCT 27.9* 29.2* 27.2* 25.9* 24.9*  MCV 103.3* 103.2* 102.6* 101.6* 100.4*  PLT 124* 115* 103* 97* 91*   Cardiac Enzymes: No results for input(s): CKTOTAL, CKMB, CKMBINDEX, TROPONINI in the last 168 hours. BNP (last 3 results) No results for input(s): PROBNP in the last 8760 hours. CBG: No results for input(s): GLUCAP in the last 168  hours. D-Dimer: No results for input(s): DDIMER in the last 72 hours. Hgb A1c: No results for input(s): HGBA1C in the last 72 hours. Lipid Profile: No results for input(s): CHOL, HDL, LDLCALC, TRIG, CHOLHDL, LDLDIRECT in the last 72 hours. Thyroid function studies: No results for input(s): TSH, T4TOTAL, T3FREE, THYROIDAB in the last 72 hours.  Invalid input(s): FREET3 Anemia work up: No results for input(s): VITAMINB12, FOLATE, FERRITIN, TIBC, IRON, RETICCTPCT in the last 72 hours. Sepsis Labs: Recent Labs  Lab 08/19/19 0502 08/20/19 0306 08/21/19 1044 08/22/19 0321  WBC 10.5 10.7* 10.3 9.5    Microbiology No results found for this or any previous visit (from the past 240 hour(s)).  Procedures and diagnostic studies:  No results found.  Medications:   . sodium chloride   Intravenous Once  . allopurinol  100 mg Oral Daily  . aspirin EC  81 mg Oral Daily  . calcium acetate  667 mg Oral TID WC  . Chlorhexidine Gluconate Cloth  6 each Topical Q0600  . Chlorhexidine Gluconate Cloth  6 each Topical Q0600  . darbepoetin (ARANESP) injection - DIALYSIS  150 mcg Intravenous Q Tue-HD  . famotidine  20 mg Oral Daily  . feeding supplement (NEPRO CARB STEADY)  237 mL Oral BID BM  . feeding supplement (PRO-STAT SUGAR FREE 64)  30 mL Oral BID  . gabapentin  100 mg Oral TID  . guaiFENesin  600 mg Oral BID  . heparin sodium (porcine)      . latanoprost  1 drop Left Eye QHS  . midodrine      . midodrine  10 mg Oral BID WC  . multivitamin  1 tablet Oral QHS  . predniSONE  10 mg Oral Q breakfast  . tacrolimus  2 mg Oral QHS  . tacrolimus  3 mg Oral q morning - 10a  . Vitamin D (Ergocalciferol)  50,000 Units Oral Q Sun   Continuous Infusions:   LOS: 17 days   Geradine Girt  [facility]   How to contact the [facility] Attending or Consulting provider [place] or covering provider during after hours [place], for this patient?  1. Check the care team in [facility] and look for a)  attending/consulting [facility] provider listed and b) the [facility] team listed 2. Log into [email or web address] and use [place]'s universal password to access. If you do not have the password, please contact the hospital operator. 3.  Locate the [facility] provider you are looking for under [facility] and page to a number that you can be directly reached. 4. If you still have difficulty reaching the provider, please page the [facility] (Director on Call) for the Hospitalists listed on amion for assistance.  08/22/2019, 3:01 PM

## 2019-08-22 NOTE — Progress Notes (Signed)
Patient ID: Tina Watkins, female   DOB: [birth year], 54 y.o.   MRN: [id number] [facility] Progress Note   Assessment/ Plan:   1.  Healthcare associated pneumonia: Status post completion of intravenous/oral antibiotics for suspected left lower lobe pneumonia.  Recent deconditioning after hospitalizations and initiation of HD for ESRD noted--awaiting SNF for continued outpatient OT/PT.   2. 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  We will continue with dialysis on a TTS schedule.  Right femoral HD catheter with essentially end-stage access situation due to multifocal CV stenosis.  3. Anemia of ESRD: Continue ESA - increase aranesp to 150 mcg every Tuesday for now  4. CKD-MBD: Calcium level acceptable with improving phosphorus; now on calcium acetate    5.  Acute gout flare: Continue with prednisone taper, colchicine and uric acid lowering therapy with allopurinol per primary team.  6. Hypotension: Continue midodrine for blood pressure support and monitor with UF on HD.   7. Immunosuppression - on immunosuppression (pred 5 mg daily and tacrolimus) with hx of renal transplant  Subjective:   Feeling ok this AM.  Last HD on 7/17 with 2.3 kg UF - states no issues.  Still hasn't heard about SNF bed yet    Review of systems:   Mild nausea no vomiting  Denies shortness of breath or chest pain   Objective:   BP 108/64   Pulse 70   Temp 98.3 F (36.8 C) (Oral)   Resp 20   Ht 4\' 11"  (1.499 m)   Wt 48 kg   SpO2 100%   BMI 21.37 kg/m   Physical Exam: Gen: adult female in bed in NAD  CVS: S1 and S2 no rub Resp: Diminished breath sounds over bases; unlabored Abd: Soft, flat, nontender Ext: Trace bilateral lower extremity edema.   Neuro alert and oriented x3 provides hx and follows commands Psych normal mood and affect Access: Right femoral HD catheter.  Labs: BMET Recent Labs  Lab 08/16/19 0453 08/17/19 0358  08/18/19 0311 08/19/19 0502 08/20/19 0306 08/21/19 1044 08/22/19 0321  NA 136 135 133* 134* 130* 131* 129*  K 4.0 4.1 3.9 3.8 4.3 4.0 4.6  CL 100 98 98 98 96* 94* 94*  CO2 20* 20* 22 22 25 24 24   GLUCOSE 119* 113* 88 101* 112* 103* 98  BUN 44* 62* 36* 52* 32* 51* 68*  CREATININE 3.86* 4.97* 3.73* 5.05* 3.55* 4.82* 5.32*  CALCIUM 8.6* 8.9 8.8* 8.6* 8.6* 9.2 8.7*  PHOS 4.9* 5.6* 4.7* 5.6* 4.4 4.9* 5.1*   CBC Recent Labs  Lab 08/19/19 0502 08/20/19 0306 08/21/19 1044 08/22/19 0321  WBC 10.5 10.7* 10.3 9.5  NEUTROABS 9.2* 9.7* 8.8* 8.3*  HGB 9.2* 8.5* 8.1* 8.0*  HCT 29.2* 27.2* 25.9* 24.9*  MCV 103.2* 102.6* 101.6* 100.4*  PLT 115* 103* 97* 91*      Medications:    . sodium chloride   Intravenous Once  . allopurinol  100 mg Oral Daily  . aspirin EC  81 mg Oral Daily  . calcium acetate  667 mg Oral TID WC  . Chlorhexidine Gluconate Cloth  6 each Topical Q0600  . Chlorhexidine Gluconate Cloth  6 each Topical Q0600  . colchicine  0.3 mg Oral Daily  . darbepoetin (ARANESP) injection - DIALYSIS  100 mcg Intravenous Q Tue-HD  . famotidine  20 mg Oral Daily  . feeding supplement (NEPRO CARB STEADY)  237 mL Oral BID BM  . feeding supplement (PRO-STAT SUGAR  FREE 64)  30 mL Oral BID  . gabapentin  100 mg Oral TID  . guaiFENesin  600 mg Oral BID  . latanoprost  1 drop Left Eye QHS  . midodrine  10 mg Oral BID WC  . multivitamin  1 tablet Oral QHS  . tacrolimus  2 mg Oral QHS  . tacrolimus  3 mg Oral q morning - 10a  . Vitamin D (Ergocalciferol)  50,000 Units Oral Q Kasandra Knudsen, MD 08/22/2019, 8:39 AM

## 2019-08-23 LAB — CBC WITH DIFFERENTIAL/PLATELET
Abs Immature Granulocytes: 0.13 10*3/uL — ABNORMAL HIGH (ref 0.00–0.07)
Basophils Absolute: 0 10*3/uL (ref 0.0–0.1)
Basophils Relative: 0 %
Eosinophils Absolute: 0 10*3/uL (ref 0.0–0.5)
Eosinophils Relative: 0 %
HCT: 22.8 % — ABNORMAL LOW (ref 36.0–46.0)
Hemoglobin: 7.4 g/dL — ABNORMAL LOW (ref 12.0–15.0)
Immature Granulocytes: 1 %
Lymphocytes Relative: 3 %
Lymphs Abs: 0.3 10*3/uL — ABNORMAL LOW (ref 0.7–4.0)
MCH: 33 pg (ref 26.0–34.0)
MCHC: 32.5 g/dL (ref 30.0–36.0)
MCV: 101.8 fL — ABNORMAL HIGH (ref 80.0–100.0)
Monocytes Absolute: 0.9 10*3/uL (ref 0.1–1.0)
Monocytes Relative: 8 %
Neutro Abs: 9.4 10*3/uL — ABNORMAL HIGH (ref 1.7–7.7)
Neutrophils Relative %: 88 %
Platelets: 83 10*3/uL — ABNORMAL LOW (ref 150–400)
RBC: 2.24 MIL/uL — ABNORMAL LOW (ref 3.87–5.11)
RDW: 22.5 % — ABNORMAL HIGH (ref 11.5–15.5)
WBC: 10.7 10*3/uL — ABNORMAL HIGH (ref 4.0–10.5)
nRBC: 0.2 % (ref 0.0–0.2)

## 2019-08-23 LAB — RENAL FUNCTION PANEL
Albumin: 3.3 g/dL — ABNORMAL LOW (ref 3.5–5.0)
Anion gap: 11 (ref 5–15)
BUN: 38 mg/dL — ABNORMAL HIGH (ref 6–20)
CO2: 26 mmol/L (ref 22–32)
Calcium: 8.6 mg/dL — ABNORMAL LOW (ref 8.9–10.3)
Chloride: 97 mmol/L — ABNORMAL LOW (ref 98–111)
Creatinine, Ser: 3.22 mg/dL — ABNORMAL HIGH (ref 0.44–1.00)
GFR calc Af Amer: 18 mL/min — ABNORMAL LOW (ref >60)
GFR calc non Af Amer: 16 mL/min — ABNORMAL LOW (ref >60)
Glucose, Bld: 102 mg/dL — ABNORMAL HIGH (ref 70–99)
Phosphorus: 3.5 mg/dL (ref 2.5–4.6)
Potassium: 3.8 mmol/L (ref 3.5–5.1)
Sodium: 134 mmol/L — ABNORMAL LOW (ref 135–145)

## 2019-08-23 LAB — HEMOGLOBIN AND HEMATOCRIT, BLOOD
HCT: 28.7 % — ABNORMAL LOW (ref 36.0–46.0)
Hemoglobin: 9.2 g/dL — ABNORMAL LOW (ref 12.0–15.0)

## 2019-08-23 LAB — PREPARE RBC (CROSSMATCH)

## 2019-08-23 IMAGING — DX DG CHEST 2V
2 series · 2 of 2 positions shown · non-contrast
Comparison: Chest CT dated 10/24/2015

CLINICAL DATA: 51-year-old female with generalized body ache
presenting with fever and sore throat.

EXAM:
CHEST - 2 VIEW

[chest pa]
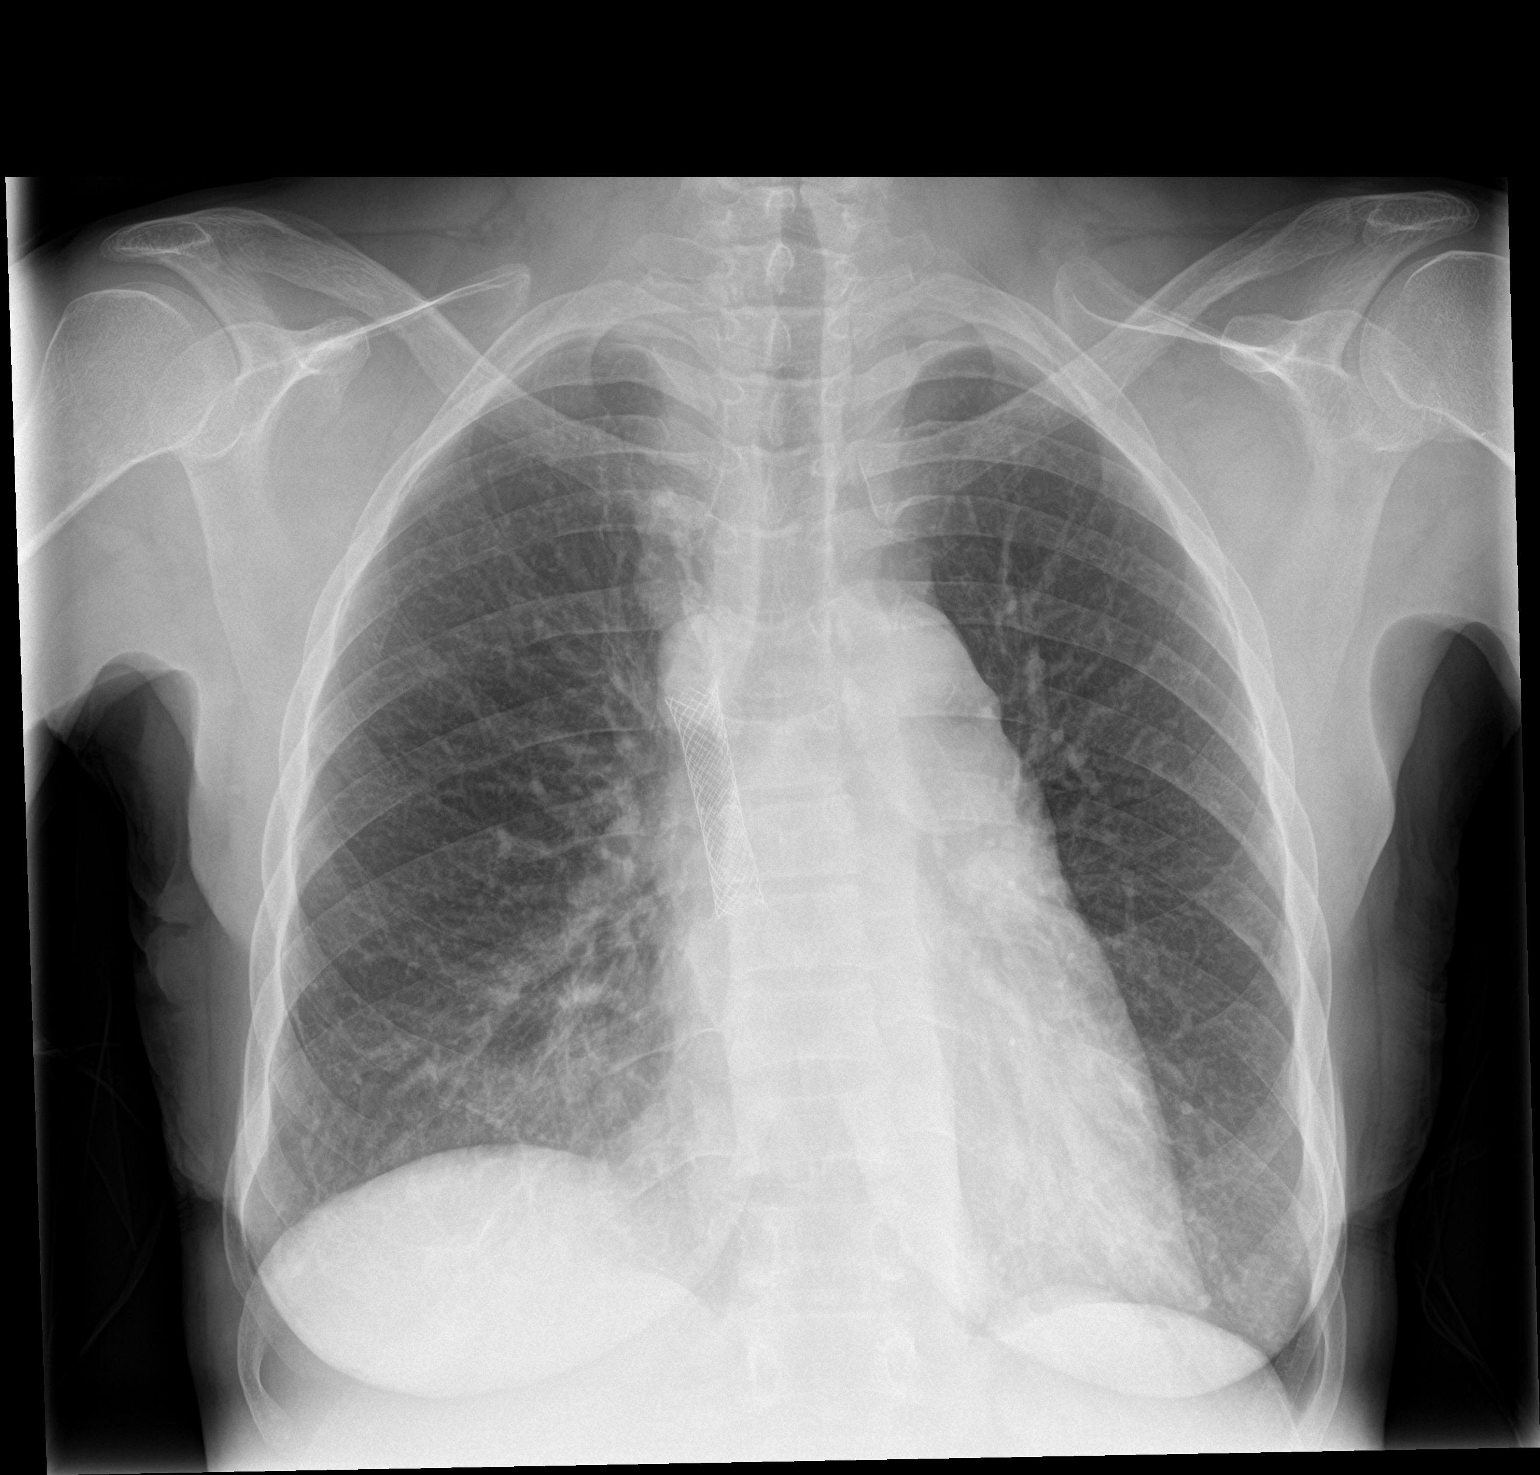

[chest lat]
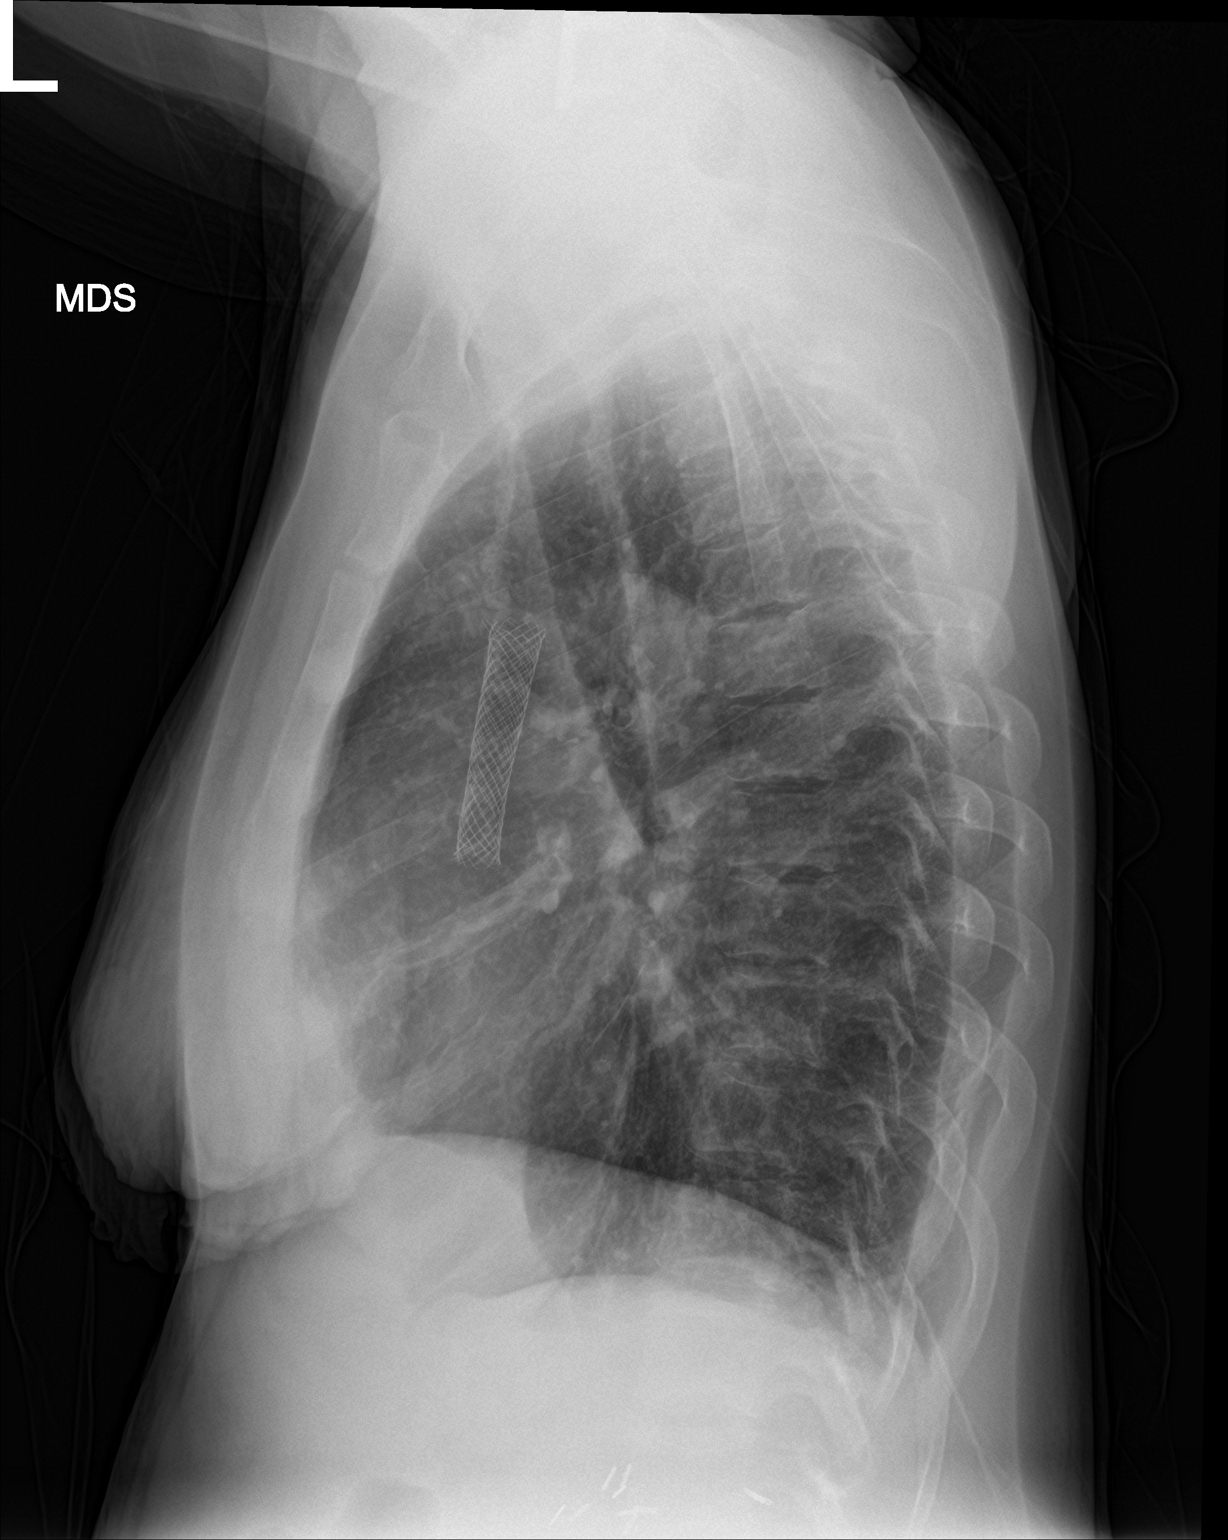

[2 of 2 positions shown; findings below may reference images not displayed]

FINDINGS: There are minimal bibasilar atelectatic changes. No focal
consolidation, pleural effusion, or pneumothorax. There is mild
cardiomegaly. A SVC stent is noted. Partially visualized cervical
fixation hardware. No acute osseous pathology.
IMPRESSION: No active cardiopulmonary disease.

Mild cardiomegaly.

## 2019-08-23 MED ORDER — MIDODRINE HCL 5 MG PO TABS
10.0000 mg | ORAL_TABLET | Freq: Three times a day (TID) | ORAL | Status: DC
  Administered 2019-08-23 – 2019-08-24 (×4): 10 mg via ORAL
  Filled 2019-08-23 (×4): qty 2

## 2019-08-23 MED ORDER — SODIUM CHLORIDE 0.9% IV SOLUTION: Freq: Once | INTRAVENOUS | Status: AC

## 2019-08-23 MED ORDER — CHLORHEXIDINE GLUCONATE CLOTH 2 % EX PADS
6.0000 | MEDICATED_PAD | Freq: Every day | CUTANEOUS | Status: DC
  Administered 2019-08-24: 6 via TOPICAL

## 2019-08-23 NOTE — Progress Notes (Signed)
Patient ID: Tina Watkins, female   DOB: April 03, 1965, 54 y.o.   MRN: [id number] [facility] Progress Note   Assessment/ Plan:   1.  Healthcare associated pneumonia: Status post completion of intravenous/oral antibiotics for suspected left lower lobe pneumonia.  Recent deconditioning after hospitalizations and initiation of HD for ESRD noted--awaiting SNF for continued outpatient OT/PT.   2. 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  We will continue with dialysis on a TTS schedule.  Right femoral HD catheter with essentially end-stage access situation due to multifocal CV stenosis.  3. Anemia of ESRD: Continue ESA - increased aranesp to 150 mcg every Tuesday for now.  1 unit of PRBC on 7/21 ordered  4. CKD-MBD: Calcium level acceptable with improving phosphorus; now on calcium acetate    5.  Acute gout flare: Continue with prednisone taper, colchicine and uric acid lowering therapy with allopurinol per primary team.  6. Hypotension: Continue midodrine for blood pressure support and monitor with UF on HD.   7. Immunosuppression - on immunosuppression (pred 5 mg daily and tacrolimus) with hx of renal transplant  Subjective:   Last HD on 7/20 with 2.5 kg UF - hypotension requiring albumin.  She had wanted more fluid off but we kept at 2.5 due to hypotension.  She and I discussed risks/benefits/indications for blood transfusion and she consents to receive blood products.   Hasn't heard about SNF yet   Review of systems:     No nausea no vomiting  Denies shortness of breath or chest pain   Objective:   BP (!) 95/52   Pulse 79   Temp 98.7 F (37.1 C) (Oral)   Resp 17   Ht 4\' 11"  (1.499 m)   Wt 51.4 kg   SpO2 98%   BMI 22.89 kg/m   Physical Exam: Gen: adult female in bed in NAD   CVS: S1 and S2 no rub Resp: Diminished breath sounds over bases; unlabored Abd: Soft, flat, nontender Ext: Trace bilateral lower extremity  edema.   Neuro alert and oriented x3 provides hx and follows commands Psych normal mood and affect Access: Right femoral HD catheter.  Labs: BMET Recent Labs  Lab 08/17/19 0358 08/18/19 0311 08/19/19 0502 08/20/19 0306 08/21/19 1044 08/22/19 0321 08/23/19 0245  NA 135 133* 134* 130* 131* 129* 134*  K 4.1 3.9 3.8 4.3 4.0 4.6 3.8  CL 98 98 98 96* 94* 94* 97*  CO2 20* 22 22 25 24 24 26   GLUCOSE 113* 88 101* 112* 103* 98 102*  BUN 62* 36* 52* 32* 51* 68* 38*  CREATININE 4.97* 3.73* 5.05* 3.55* 4.82* 5.32* 3.22*  CALCIUM 8.9 8.8* 8.6* 8.6* 9.2 8.7* 8.6*  PHOS 5.6* 4.7* 5.6* 4.4 4.9* 5.1* 3.5   CBC Recent Labs  Lab 08/20/19 0306 08/21/19 1044 08/22/19 0321 08/23/19 0245  WBC 10.7* 10.3 9.5 10.7*  NEUTROABS 9.7* 8.8* 8.3* 9.4*  HGB 8.5* 8.1* 8.0* 7.4*  HCT 27.2* 25.9* 24.9* 22.8*  MCV 102.6* 101.6* 100.4* 101.8*  PLT 103* 97* 91* 83*      Medications:    . sodium chloride   Intravenous Once  . allopurinol  100 mg Oral Daily  . aspirin EC  81 mg Oral Daily  . calcium acetate  667 mg Oral TID WC  . Chlorhexidine Gluconate Cloth  6 each Topical Q0600  . Chlorhexidine Gluconate Cloth  6 each Topical Q0600  . darbepoetin (ARANESP) injection - DIALYSIS  150 mcg Intravenous Q  Tue-HD  . famotidine  20 mg Oral Daily  . feeding supplement (NEPRO CARB STEADY)  237 mL Oral BID BM  . feeding supplement (PRO-STAT SUGAR FREE 64)  30 mL Oral BID  . gabapentin  100 mg Oral TID  . guaiFENesin  600 mg Oral BID  . latanoprost  1 drop Left Eye QHS  . midodrine  10 mg Oral BID WC  . multivitamin  1 tablet Oral QHS  . predniSONE  10 mg Oral Q breakfast  . tacrolimus  2 mg Oral QHS  . tacrolimus  3 mg Oral q morning - 10a  . Vitamin D (Ergocalciferol)  50,000 Units Oral Q Kasandra Knudsen, MD 08/23/2019, 7:50 AM

## 2019-08-23 NOTE — Progress Notes (Signed)
PROGRESS NOTE    Tina Watkins  XLK:[id number] DOB: 1965/05/05 DOA: 08/05/2019 PCP: [name], FNP   Brief Narrative:   Tina Watkins is an 54 y.o. female with history of ESRD status post renal transplant complicated by CKD stage IV on chronic steroids and immunosuppressive therapy, chronic anemia secondary to CKD, hypertension, TIA, chronic diastolic congestive heart failure, moderate pulmonary hypertension who presented with multiple joints pain which significantly limited her mobility. Symptoms started about a week ago. It involved right wrist and several fingers and right elbow and bilateral knees and ankles. She also had low-grade fever. Patient also developed productive cough with yellow sputum. She was started on hemodialysis this June because of failed kidney transplant.She has a right-sided femoral HD catheter for dialysis. On presentation, chest x-ray showed possible left lower lobe pneumonia. Admitted for further management.Startedon IV antibiotics and being also managed for acute gout flare.Hospital course remained stable. PT recommended SNF but patient may progress to home with continued therapy.  Assessment & Plan:   Active Problems:   History of renal transplantation   ESRD (end stage renal disease) ([place])   HCAP (healthcare-associated pneumonia)   Decubitus ulcer of sacral region, stage 1   Healthcare associated pneumonia: Presented with cough, new infiltrate on the chest x-ray.Leukocytosis.started on vancomycin and Zosyn.Elevated procalcitonin. Presented with fever. Since MRSA PCR is negative, vancomycin stopped.  Afebrile now.Cultures have remained negative. Her respiratory status is stable. Currently on room air. Antibiotics changed to oral Augmentin which she completed course on 08/11/2019.  ESRD on hemodialysis/failed renal transplant x3:Nephrology following. She has right-sided femoral dialysis catheter. She has  extremely limited IV access on the upper extremities. She is following with vascular surgery as an outpatient for permanent access. Patient still produces urine.She gets dialyzed on TTS schedule. Plan for next HD 7/22.  Acute gout flareup: Presented with pain on the multiple joints, more pronounced in bilateral ankle and right thumb. Colchicine restarted due to severe pain. Dose of steroid has been increased from her baseline of 5 mg p.o. daily to 20 mg p.o. daily which was increased to twice daily on 08/12/2019 and patient continues to feel better. Her ankle pain and right thumb painhas resolved.. Her right thumb is back to normal and is nontender. Continue with tapering dose of prednisone:  20 mg on 7/17/2021for 3 days and then 10 mg 3 days (7/20) and then back to 5 mg, her home dose. Reduce dose to home dose by 7/22.  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 Her nephrologist is still recommending to continue steroid and tacrolimus. Imuran was discontinued last month due to worsening pancytopenia.  Chronic hypotension:On midodrine. BP soft. She has chronic hypotension.  Leukocytosis:Leukocytosis most likely associated with her acute gout flareup.resolved  Anemia of chronic disease/thrombocytopenia: Chronic microcytosis. B12 and folate were normal.   -PRBC transfusion of 1U ordered by Nephrology on 7/21. -Recheck CBC in am  Chronic diastolic heart failure: Volume managed by dialysis. Last echo on 12/20 showed restrictive cardiomyopathy, grade 3 diastolic dysfunction, preserved ejection fraction, moderate pulmonary hypertension  History of Covid infection: - April 2021. Presented with Covid pneumonia and had prolonged hospitalization.  Debility/deconditioning: She is severely deconditioned due to her debilitating chronic gouty arthritis. PT recommended skilled nursing facility on discharge. Patient was confident that she has  progressed and now she might be able to go home however upon reevaluation by PT , they still believe that patient will benefit from SNF. TOC working on  placement. Placement has been difficult due to her having Medicaid insurance and requiring hemodialysis transport.   Pressure Injury 08/05/19 Sacrum Mid;Medial Stage 2 -  Partial thickness loss of dermis presenting as a shallow open injury with a red, pink wound bed without slough. (Active)  08/05/19 1900  Location: Sacrum  Location Orientation: Mid;Medial  Staging: Stage 2 -  Partial thickness loss of dermis presenting as a shallow open injury with a red, pink wound bed without slough.  Wound Description (Comments):   Present on Admission:   -c/o burning pain since Feb- will increase neurontin -HSV swab on small lesion on left labia still pending    DVT prophylaxis:SCDs, refuses heparin Code Status: Full Family Communication: None at bedside; pt states she will call Disposition Plan:  Status is: Inpatient  Remains inpatient appropriate because:Unsafe d/c plan and Inpatient level of care appropriate due to severity of illness   Dispo:  Patient From: Home  Planned Disposition:SNF  Expected discharge date: 1-2 days  Medically stable for discharge: No, needing PRBC infusion today; plans for HD 7/22   Consultants:   Nephrology  Procedures:   See below  Antimicrobials:  Anti-infectives (From admission, onward)   Start     Dose/Rate Route Frequency Ordered Stop   08/08/19 1200  vancomycin (VANCOCIN) IVPB 500 mg/100 ml premix  Status:  Discontinued        500 mg 100 mL/hr over 60 Minutes Intravenous Every T-Th-Sa (Hemodialysis) 08/05/19 1522 08/07/19 0748   08/08/19 1200  amoxicillin-clavulanate (AUGMENTIN) 500-125 MG per tablet 500 mg        1 tablet Oral 2 times daily 08/08/19 1101 08/11/19 1900   08/08/19 0000  amoxicillin-clavulanate (AUGMENTIN) 500-125 MG tablet  Status:  Discontinued        1 tablet Oral 2 times  daily 08/08/19 1233 08/11/19    08/05/19 2030  piperacillin-tazobactam (ZOSYN) IVPB 2.25 g  Status:  Discontinued        2.25 g 100 mL/hr over 30 Minutes Intravenous Every 8 hours 08/05/19 1522 08/08/19 1101   08/05/19 1230  vancomycin (VANCOCIN) IVPB 1000 mg/200 mL premix        1,000 mg 200 mL/hr over 60 Minutes Intravenous  Once 08/05/19 1219 08/05/19 1412   08/05/19 1230  piperacillin-tazobactam (ZOSYN) IVPB 3.375 g        3.375 g 100 mL/hr over 30 Minutes Intravenous  Once 08/05/19 1219 08/05/19 1328       Subjective: Patient seen and evaluated today with no new acute complaints or concerns. No acute concerns or events noted overnight.  Plan for PRBC transfusion today.  Awaiting SNF placement.  Her joints are aching loss.  Objective: Vitals:   08/22/19 2135 08/23/19 0500 08/23/19 0748 08/23/19 1010  BP: (!) 95/52  (!) 86/54 (!) 88/59  Pulse: 79  79 86  Resp:   19   Temp: 98.7 F (37.1 C)  98.8 F (37.1 C)   TempSrc: Oral  Oral   SpO2: 98%  93% 96%  Weight:  51.4 kg    Height:        Intake/Output Summary (Last 24 hours) at 08/23/2019 1042 Last data filed at 08/22/2019 1500 Gross per 24 hour  Intake 100 ml  Output 2495 ml  Net -2395 ml   Filed Weights   08/19/19 1246 08/22/19 1342 08/23/19 0500  Weight: 48 kg 49.1 kg 51.4 kg    Examination:  General exam: Appears calm and comfortable  Respiratory system: Clear to auscultation. Respiratory effort  normal. Cardiovascular system: S1 & S2 heard, RRR. No JVD, murmurs, rubs, gallops or clicks. No pedal edema. Gastrointestinal system: Abdomen is nondistended, soft and nontender. No organomegaly or masses felt. Normal bowel sounds heard. Central nervous system: Alert and oriented. No focal neurological deficits. Extremities: Symmetric 5 x 5 power. Skin: No rashes, lesions or ulcers Psychiatry: Judgement and insight appear normal. Mood & affect appropriate.     Data Reviewed: I have personally reviewed following labs  and imaging studies  CBC: Recent Labs  Lab 08/19/19 0502 08/20/19 0306 08/21/19 1044 08/22/19 0321 08/23/19 0245  WBC 10.5 10.7* 10.3 9.5 10.7*  NEUTROABS 9.2* 9.7* 8.8* 8.3* 9.4*  HGB 9.2* 8.5* 8.1* 8.0* 7.4*  HCT 29.2* 27.2* 25.9* 24.9* 22.8*  MCV 103.2* 102.6* 101.6* 100.4* 101.8*  PLT 115* 103* 97* 91* 83*   Basic Metabolic Panel: Recent Labs  Lab 08/19/19 0502 08/20/19 0306 08/21/19 1044 08/22/19 0321 08/23/19 0245  NA 134* 130* 131* 129* 134*  K 3.8 4.3 4.0 4.6 3.8  CL 98 96* 94* 94* 97*  CO2 22 25 24 24 26   GLUCOSE 101* 112* 103* 98 102*  BUN 52* 32* 51* 68* 38*  CREATININE 5.05* 3.55* 4.82* 5.32* 3.22*  CALCIUM 8.6* 8.6* 9.2 8.7* 8.6*  PHOS 5.6* 4.4 4.9* 5.1* 3.5   GFR: Estimated Creatinine Clearance: 14.1 mL/min (A) (by C-G formula based on SCr of 3.22 mg/dL (H)). Liver Function Tests: Recent Labs  Lab 08/19/19 0502 08/20/19 0306 08/21/19 1044 08/22/19 0321 08/23/19 0245  ALBUMIN 2.9* 2.8* 3.0* 2.8* 3.3*   No results for input(s): LIPASE, AMYLASE in the last 168 hours. No results for input(s): AMMONIA in the last 168 hours. Coagulation Profile: No results for input(s): INR, PROTIME in the last 168 hours. Cardiac Enzymes: No results for input(s): CKTOTAL, CKMB, CKMBINDEX, TROPONINI in the last 168 hours. BNP (last 3 results) No results for input(s): PROBNP in the last 8760 hours. HbA1C: No results for input(s): HGBA1C in the last 72 hours. CBG: No results for input(s): GLUCAP in the last 168 hours. Lipid Profile: No results for input(s): CHOL, HDL, LDLCALC, TRIG, CHOLHDL, LDLDIRECT in the last 72 hours. Thyroid Function Tests: No results for input(s): TSH, T4TOTAL, FREET4, T3FREE, THYROIDAB in the last 72 hours. Anemia Panel: No results for input(s): VITAMINB12, FOLATE, FERRITIN, TIBC, IRON, RETICCTPCT in the last 72 hours. Sepsis Labs: No results for input(s): PROCALCITON, LATICACIDVEN in the last 168 hours.  No results found for this or  any previous visit (from the past 240 hour(s)).       Radiology Studies: No results found.      Scheduled Meds: . sodium chloride   Intravenous Once  . sodium chloride   Intravenous Once  . allopurinol  100 mg Oral Daily  . aspirin EC  81 mg Oral Daily  . calcium acetate  667 mg Oral TID WC  . Chlorhexidine Gluconate Cloth  6 each Topical Q0600  . Chlorhexidine Gluconate Cloth  6 each Topical Q0600  . darbepoetin (ARANESP) injection - DIALYSIS  150 mcg Intravenous Q Tue-HD  . famotidine  20 mg Oral Daily  . feeding supplement (NEPRO CARB STEADY)  237 mL Oral BID BM  . feeding supplement (PRO-STAT SUGAR FREE 64)  30 mL Oral BID  . gabapentin  100 mg Oral TID  . guaiFENesin  600 mg Oral BID  . latanoprost  1 drop Left Eye QHS  . midodrine  10 mg Oral BID WC  . multivitamin  1 tablet  Oral QHS  . predniSONE  10 mg Oral Q breakfast  . tacrolimus  2 mg Oral QHS  . tacrolimus  3 mg Oral q morning - 10a  . Vitamin D (Ergocalciferol)  50,000 Units Oral Q Sun    LOS: 18 days    Time spent: 35 minutes    Nikira Kushnir Darleen Crocker, DO [facility]  If 7PM-7AM, please contact night-coverage [email or web address] 08/23/2019, 10:42 AM

## 2019-08-23 NOTE — TOC Progression Note (Signed)
Transition of Care [facility]) - Progression Note    Patient Details  Name: Tina Watkins MRN: [id number] Date of Birth: 04-24-1965  Transition of Care [facility]) CM/SW Contact  Angelita Ingles, RN  Phone Number: [phone]  08/23/2019, 9:53 AM  Clinical Narrative:    Patient called CM to state that she spoke with someone at [facility] and they told her that they had no record of bed request. CM made patient aware that bed request has been sent and also denied. CM made patient aware that CM would follow up with facilities and that facilities should not send messages to CM by patient but speak directly to CM. CM advised patient to have any contacts to call the CM directly.  CM called Shelia at [facility] to inquire about bed offer. Adela Lank states that she has initiated insurance authorization and would have to wait for approval before making a bed offer. Patient has been updated.     CM called Shelia at [facility] to inquire about bed offer. Adela Lank states that she has i         Expected Discharge Plan and Services           Expected Discharge Date: 08/08/19                                     Social Determinants of Health (SDOH) Interventions    Readmission Risk Interventions Readmission Risk Prevention Plan 05/18/2019  Transportation Screening Complete  PCP or Specialist Appt within 3-5 Days Complete  HRI or [place] (No Data)  Medication Review [employer] photographer) Complete  Some recent data might be hidden

## 2019-08-24 DIAGNOSIS — E8770 Fluid overload, unspecified: Secondary | ICD-10-CM | POA: Diagnosis not present

## 2019-08-24 DIAGNOSIS — K219 Gastro-esophageal reflux disease without esophagitis: Secondary | ICD-10-CM | POA: Diagnosis not present

## 2019-08-24 DIAGNOSIS — I5032 Chronic diastolic (congestive) heart failure: Secondary | ICD-10-CM | POA: Diagnosis not present

## 2019-08-24 DIAGNOSIS — D631 Anemia in chronic kidney disease: Secondary | ICD-10-CM | POA: Diagnosis not present

## 2019-08-24 DIAGNOSIS — J189 Pneumonia, unspecified organism: Secondary | ICD-10-CM | POA: Diagnosis not present

## 2019-08-24 DIAGNOSIS — M5090 Cervical disc disorder, unspecified, unspecified cervical region: Secondary | ICD-10-CM | POA: Diagnosis not present

## 2019-08-24 DIAGNOSIS — M109 Gout, unspecified: Secondary | ICD-10-CM | POA: Diagnosis not present

## 2019-08-24 DIAGNOSIS — Z940 Kidney transplant status: Secondary | ICD-10-CM | POA: Diagnosis not present

## 2019-08-24 DIAGNOSIS — N186 End stage renal disease: Secondary | ICD-10-CM | POA: Diagnosis not present

## 2019-08-24 DIAGNOSIS — Z7401 Bed confinement status: Secondary | ICD-10-CM | POA: Diagnosis not present

## 2019-08-24 DIAGNOSIS — Z8673 Personal history of transient ischemic attack (TIA), and cerebral infarction without residual deficits: Secondary | ICD-10-CM | POA: Diagnosis not present

## 2019-08-24 DIAGNOSIS — F419 Anxiety disorder, unspecified: Secondary | ICD-10-CM | POA: Diagnosis not present

## 2019-08-24 DIAGNOSIS — I959 Hypotension, unspecified: Secondary | ICD-10-CM | POA: Diagnosis not present

## 2019-08-24 DIAGNOSIS — Z8616 Personal history of COVID-19: Secondary | ICD-10-CM | POA: Diagnosis not present

## 2019-08-24 DIAGNOSIS — M2550 Pain in unspecified joint: Secondary | ICD-10-CM | POA: Diagnosis not present

## 2019-08-24 DIAGNOSIS — D696 Thrombocytopenia, unspecified: Secondary | ICD-10-CM | POA: Diagnosis not present

## 2019-08-24 DIAGNOSIS — I5033 Acute on chronic diastolic (congestive) heart failure: Secondary | ICD-10-CM | POA: Diagnosis not present

## 2019-08-24 DIAGNOSIS — M1000 Idiopathic gout, unspecified site: Secondary | ICD-10-CM | POA: Diagnosis not present

## 2019-08-24 DIAGNOSIS — Z992 Dependence on renal dialysis: Secondary | ICD-10-CM | POA: Diagnosis not present

## 2019-08-24 DIAGNOSIS — L89151 Pressure ulcer of sacral region, stage 1: Secondary | ICD-10-CM | POA: Diagnosis not present

## 2019-08-24 DIAGNOSIS — R5381 Other malaise: Secondary | ICD-10-CM | POA: Diagnosis not present

## 2019-08-24 DIAGNOSIS — N250 Renal osteodystrophy: Secondary | ICD-10-CM | POA: Diagnosis not present

## 2019-08-24 DIAGNOSIS — I132 Hypertensive heart and chronic kidney disease with heart failure and with stage 5 chronic kidney disease, or end stage renal disease: Secondary | ICD-10-CM | POA: Diagnosis not present

## 2019-08-24 LAB — TYPE AND SCREEN
ABO/RH(D): B POS
Antibody Screen: NEGATIVE
Unit division: 0

## 2019-08-24 LAB — BPAM RBC
Blood Product Expiration Date: 202108182359
ISSUE DATE / TIME: 202107211605
Unit Type and Rh: 7300

## 2019-08-24 LAB — RENAL FUNCTION PANEL
Albumin: 3.4 g/dL — ABNORMAL LOW (ref 3.5–5.0)
Anion gap: 13 (ref 5–15)
BUN: 59 mg/dL — ABNORMAL HIGH (ref 6–20)
CO2: 24 mmol/L (ref 22–32)
Calcium: 9.2 mg/dL (ref 8.9–10.3)
Chloride: 96 mmol/L — ABNORMAL LOW (ref 98–111)
Creatinine, Ser: 4.34 mg/dL — ABNORMAL HIGH (ref 0.44–1.00)
GFR calc Af Amer: 13 mL/min — ABNORMAL LOW (ref >60)
GFR calc non Af Amer: 11 mL/min — ABNORMAL LOW (ref >60)
Glucose, Bld: 84 mg/dL (ref 70–99)
Phosphorus: 3.3 mg/dL (ref 2.5–4.6)
Potassium: 3.9 mmol/L (ref 3.5–5.1)
Sodium: 133 mmol/L — ABNORMAL LOW (ref 135–145)

## 2019-08-24 LAB — SARS CORONAVIRUS 2 BY RT PCR (HOSPITAL ORDER, PERFORMED IN ~~LOC~~ HOSPITAL LAB): SARS Coronavirus 2: NEGATIVE

## 2019-08-24 MED ORDER — CALCIUM ACETATE (PHOS BINDER) 667 MG PO CAPS: 667.0000 mg | ORAL_CAPSULE | Freq: Three times a day (TID) | ORAL | 0 refills | Status: AC

## 2019-08-24 MED ORDER — HEPARIN SODIUM (PORCINE) 1000 UNIT/ML IJ SOLN
INTRAMUSCULAR | Status: AC
  Administered 2019-08-24: 3200 [IU]
  Filled 2019-08-24: qty 5

## 2019-08-24 MED ORDER — ALLOPURINOL 100 MG PO TABS: 100.0000 mg | ORAL_TABLET | Freq: Every day | ORAL | 0 refills | Status: DC

## 2019-08-24 MED ORDER — HYDROMORPHONE HCL 2 MG PO TABS: 2.0000 mg | ORAL_TABLET | Freq: Two times a day (BID) | ORAL | 0 refills | Status: AC | PRN

## 2019-08-24 MED ORDER — PREDNISONE 5 MG PO TABS: 5.0000 mg | ORAL_TABLET | Freq: Every day | ORAL | 0 refills | Status: AC

## 2019-08-24 MED ORDER — ZOLPIDEM TARTRATE 10 MG PO TABS: 10.0000 mg | ORAL_TABLET | Freq: Every evening | ORAL | 0 refills | Status: DC | PRN

## 2019-08-24 MED ORDER — ALBUMIN HUMAN 25 % IV SOLN
INTRAVENOUS | Status: AC
  Administered 2019-08-24: 25 g via INTRAVENOUS
  Filled 2019-08-24: qty 100

## 2019-08-24 MED ORDER — ALBUMIN HUMAN 25 % IV SOLN: 25.0000 g | Freq: Once | INTRAVENOUS | Status: AC

## 2019-08-24 NOTE — TOC Progression Note (Signed)
Transition of Care [facility]) - Progression Note    Patient Details  Name: Tina Watkins MRN: [id number] Date of Birth: 1965-12-31  Transition of Care [facility]) CM/SW Contact  Angelita Ingles, RN Phone Number: [phone]  08/24/2019, 10:13 AM  Clinical Narrative:  PT has updated noted with SNF recommendation. Will continue to wait for authorization. Requested new Covid test.           Expected Discharge Plan and Services           Expected Discharge Date: 08/08/19                                     Social Determinants of Health (SDOH) Interventions    Readmission Risk Interventions Readmission Risk Prevention Plan 05/18/2019  Transportation Screening Complete  PCP or Specialist Appt within 3-5 Days Complete  HRI or [place] (No Data)  Medication Review Press photographer) Complete  Some recent data might be hidden

## 2019-08-24 NOTE — Progress Notes (Signed)
Physical Therapy Treatment Patient Details Name: Tina Watkins MRN: [id number] DOB: 1965-07-29 Today's Date: 08/24/2019    History of Present Illness Patient is a 30-year female with history of ESRD status post renal transplant complicated by CKD stage IV on chronic steroids and immunosuppressive therapy, chronic anemia secondary to CKD, hypertension, TIA, chronic diastolic congestive heart failure, moderate pulmonary hypertension who presented with multiple joints pain which significantly limited her mobility.  Recently re-started on HD due to kidney failure, now with R femoral HD cath.    PT Comments    Patient received sitting EOB, cognition very off today and she seemed much more lethargic possibly due to medications as she reports she received Benadryl due to HA earlier. Needed Mod cues for safety and sequencing today. Impulsive and asking nonsensical questions such as "what about the balls on the bed" when in her room.  Generally needed MinA for safety with functional transfers and gait today due to impaired cognition, impulsivity, and poor safety awareness. Per CSW, yesterday patient changed her mind and is now requesting SNF which is still certainly appropriate especially after given the events of this session- recommendations/frequency updated accordingly.    Follow Up Recommendations  SNF;Supervision/Assistance - 24 hour     Equipment Recommendations  Rolling walker with 5" wheels    Recommendations for Other Services       Precautions / Restrictions Precautions Precautions: Fall Precaution Comments: R tunneled femoral HD cath (RN and pt report okay to sit/walk), blind R eye Restrictions Weight Bearing Restrictions: No    Mobility  Bed Mobility Overal bed mobility: Needs Assistance         Sit to supine: Supervision   General bed mobility comments: S and extended time  Transfers Overall transfer level: Needs assistance Equipment used: Rolling walker (2  wheeled) Transfers: Sit to/from Stand Sit to Stand: Min assist         General transfer comment: MinA for balance, safety, impulsivity today  Ambulation/Gait Ambulation/Gait assistance: Min assist Gait Distance (Feet): 160 Feet Assistive device: Rolling walker (2 wheeled) Gait Pattern/deviations: Step-through pattern;Trunk flexed;Wide base of support;Decreased step length - right;Decreased step length - left;Drifts right/left Gait velocity: decr   General Gait Details: drifting L/R with RW, walked into obstacles on R repeatedly and a bit resistant to cuing- states "I was just messing with you" or "I was just talking to her" when called out on poor safety in hallway   Stairs             Wheelchair Mobility    Modified Rankin (Stroke Patients Only)       Balance Overall balance assessment: Needs assistance Sitting-balance support: Feet unsupported;Bilateral upper extremity supported Sitting balance-Leahy Scale: Fair Sitting balance - Comments: more impulsive   Standing balance support: Bilateral upper extremity supported;During functional activity Standing balance-Leahy Scale: Fair Standing balance comment: MinA mostly for steering RW today, safety                            Cognition Arousal/Alertness: Suspect due to medications Behavior During Therapy: Flat affect;Impulsive Overall Cognitive Status: Impaired/Different from baseline Area of Impairment: Attention;Following commands;Safety/judgement;Problem solving;Orientation;Memory;Awareness                 Orientation Level: Disoriented to;Time Current Attention Level: Sustained Memory: Decreased recall of precautions;Decreased short-term memory Following Commands: Follows one step commands inconsistently;Follows one step commands with increased time Safety/Judgement: Decreased awareness of safety;Decreased awareness of deficits Awareness: Intellectual  Problem Solving: Slow  processing;Requires verbal cues;Difficulty sequencing General Comments: very confused this morning- tells me initially that it is 1967. Impulsive today and perseverating on random things like tennis balls on walkers. Very poor awareness of safety/obstacles in hallway. Kept talking about "pressure points" in her knee. Reports she had Benadryl earlier.      Exercises      General Comments General comments (skin integrity, edema, etc.): much different cognitively than usual possibly due to medications      Pertinent Vitals/Pain Pain Assessment: Faces Faces Pain Scale: Hurts little more Pain Location: R foot and knee Pain Descriptors / Indicators: Discomfort;Sore Pain Intervention(s): Limited activity within patient's tolerance;Monitored during session    Home Living                      Prior Function            PT Goals (current goals can now be found in the care plan section) Acute Rehab PT Goals Patient Stated Goal: go home PT Goal Formulation: With patient Time For Goal Achievement: 09/03/19 Potential to Achieve Goals: Fair Progress towards PT goals: Progressing toward goals    Frequency    Min 2X/week      PT Plan Discharge plan needs to be updated;Frequency needs to be updated    Co-evaluation              AM-PAC PT "6 Clicks" Mobility   Outcome Measure  Help needed turning from your back to your side while in a flat bed without using bedrails?: None Help needed moving from lying on your back to sitting on the side of a flat bed without using bedrails?: None Help needed moving to and from a bed to a chair (including a wheelchair)?: A Little Help needed standing up from a chair using your arms (e.g., wheelchair or bedside chair)?: A Little Help needed to walk in hospital room?: A Little Help needed climbing 3-5 steps with a railing? : A Little 6 Click Score: 20    End of Session Equipment Utilized During Treatment: Gait belt Activity  Tolerance: Patient tolerated treatment well Patient left: in bed;with call bell/phone within reach;with bed alarm set   PT Visit Diagnosis: Unsteadiness on feet (R26.81);Pain;Muscle weakness (generalized) (M62.81) Pain - Right/Left: Right Pain - part of body: Ankle and joints of foot     Time: 4497-5300 PT Time Calculation (min) (ACUTE ONLY): 25 min  Charges:  $Gait Training: 8-22 mins $Therapeutic Activity: 8-22 mins                     Windell Norfolk, DPT, PN1   Supplemental Physical Therapist [place]    Pager [phone] Acute Rehab Office [phone]

## 2019-08-24 NOTE — Discharge Summary (Signed)
Physician Discharge Summary  Tina Watkins VOZ:[id number] DOB: 14-Jun-1965 DOA: 08/05/2019  PCP: [name], FNP  Admit date: 08/05/2019  Discharge date: 08/24/2019  Admitted From:Home  Disposition:  SNF  Recommendations for Outpatient Follow-up:  1. Follow up with PCP in 1-2 weeks, follow-up CBC and BMP in 1 week. 2. Patient had hemodialysis prior to discharge.  Continue usual TTS schedule. 3. Continue on prednisone 5 mg daily and continue on allopurinol daily 4. Continue on other medications as noted below  Home Health: None  Equipment/Devices: None  Discharge Condition: Stable  CODE STATUS: Full  Diet recommendation: Heart Healthy/renal  Brief/Interim Summary: Tina Watkins Tina Watkins an 54 y.o.femalewith history of ESRD status post renal transplant complicated by CKD stage IV on chronic steroids and immunosuppressive therapy, chronic anemia secondary to CKD, hypertension, TIA, chronic diastolic congestive heart failure, moderate pulmonary hypertension who presented with multiple joints pain which significantly limited her mobility. Symptoms started about a week ago. It involved right wrist and several fingers and right elbow and bilateral knees and ankles. She also had low-grade fever. Patient also developed productive cough with yellow sputum. She was started on hemodialysis this June because of failed kidney transplant.She has a right-sided femoral HD catheter for dialysis. On presentation, chest x-ray showed possible left lower lobe pneumonia. Admitted for further management.Startedon IV antibiotics and being also managed for acute gout flare.Hospital course remained stable. PT recommended SNFbut patient may progress to home with continued therapy.  Healthcare associated pneumonia: Presented with cough, new infiltrate on the chest x-ray.Leukocytosis.started on vancomycin and Zosyn.Elevated procalcitonin. Presented with fever. Since MRSA PCR  is negative, vancomycin stopped.  Afebrile now.Cultures have remained negative. Her respiratory status is stable. Currently on room air. Antibiotics changed to oral Augmentin which she completed course on 08/11/2019.  ESRD on hemodialysis/failed renal transplant x3:Nephrology following. She has right-sided femoral dialysis catheter. She has extremely limited IV access on the upper extremities. She is following with vascular surgery as an outpatient for permanent access. Patient still produces urine.She gets dialyzed on TTS schedule. Plan for next HD 7/22 prior to discharge.  Continue usual TTS schedule.  Acute gout flareup: Presented with pain on the multiple joints, more pronounced in bilateral ankle and right thumb. Colchicine restarted due to severe pain. Dose of steroid has been increased from her baseline of 5 mg p.o. daily to 20 mg p.o. daily which was increased to twice daily on 08/12/2019 and patient continues to feel better. Her ankle pain and right thumb painhas resolved.. Her right thumb is back to normal and is nontender. Continue with tapering dose of prednisone: 20 mg on 7/17/2021for 3 days and then 10 mg 3 days(7/20)and then back to 5 mg, her home dose. Reduce dose to home dose by 7/22.  Will also continue on allopurinol as prescribed.  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 Her nephrologist is still recommending to continue steroid and tacrolimus. Imuran was discontinued last month due to worsening pancytopenia.  Chronic hypotension:On midodrine. BP soft. She has chronic hypotension.  Leukocytosis:Leukocytosis most likely associated with her acute gout flareup.resolved  Anemia of chronic disease/thrombocytopenia: Chronic microcytosis. B12 and folate were normal.  -PRBC transfusion of 1U ordered by Nephrology on 7/21. -Hemoglobin improved to over 9.  Follow-up CBC in 1 week.  Chronic diastolic heart failure:  Volume managed by dialysis. Last echo on 12/20 showed restrictive cardiomyopathy, grade 3 diastolic dysfunction, preserved ejection fraction, moderate pulmonary hypertension  History of Covid infection: - April 2021.  Presented with Covid pneumonia and had prolonged hospitalization. -Repeat Covid test pending prior to discharge.  Debility/deconditioning: She is severely deconditioned due to her debilitating chronic gouty arthritis. PT recommended skilled nursing facility on discharge. Patient was confident that she has progressed and now she might be able to go home however upon reevaluation by PT , they still believe that patient will benefit from SNF. TOC working on placement. Placement has been difficult due to her having Medicaid insurance and requiring hemodialysis transport.   Pressure Injury 08/05/19 Sacrum Mid;Medial Stage 2 - Partial thickness loss of dermis presenting as a shallow open injury with a red, pink wound bed without slough. (Active)  08/05/19 1900  Location: Sacrum  Location Orientation: Mid;Medial  Staging: Stage 2 - Partial thickness loss of dermis presenting as a shallow open injury with a red, pink wound bed without slough.  Wound Description (Comments):   Present on Admission:  -c/o burning pain since Feb- will increase neurontin -HSV swab on small lesion on left labiastill pending   Discharge Diagnoses:  Active Problems:   History of renal transplantation   ESRD (end stage renal disease) ([place])   HCAP (healthcare-associated pneumonia)   Decubitus ulcer of sacral region, stage 1    Discharge Instructions  Discharge Instructions    Diet - low sodium heart healthy   Complete by: As directed    Diet - low sodium heart healthy   Complete by: As directed    Discharge instructions   Complete by: As directed    1)Please take prescribed medications as instructed. 2)Follow up with your PCP in a week. 3)Follow up with home health 4)Continue  dialysis as scheduled 5)Follow up with pain management clinic.  Name and number of the provider has been attached.  Call for appointment 6)Follow up with rheumatology for the management of her gout.  Name and number the provider has been attached.  Call for appointment.   Discharge wound care:   Complete by: As directed    Continue wound care as before   Discharge wound care:   Complete by: As directed    Evaluate wound daily with dressing changes every 24-48 hours.   Increase activity slowly   Complete by: As directed    Increase activity slowly   Complete by: As directed      Allergies as of 08/24/2019      Reactions   Levofloxacin Itching, Rash   Tape Other (See Comments)   "Certain surgical tapes peel off my skin"      Medication List    STOP taking these medications   oxyCODONE-acetaminophen 10-325 MG tablet Commonly known as: PERCOCET     TAKE these medications   allopurinol 100 MG tablet Commonly known as: ZYLOPRIM Take 1 tablet (100 mg total) by mouth daily. Start taking on: August 25, 2019   ARANESP (ALBUMIN FREE) IJ Inject as directed every 14 (fourteen) days.   aspirin EC 81 MG tablet Take 81 mg by mouth daily.   calcium acetate 667 MG capsule Commonly known as: PHOSLO Take 1 capsule (667 mg total) by mouth 3 (three) times daily with meals.   colchicine 0.6 MG tablet Take 0.5 tablets (0.3 mg total) by mouth daily.   diphenhydrAMINE 25 mg capsule Commonly known as: BENADRYL Take 1 capsule (25 mg total) by mouth every 8 (eight) hours as needed. What changed: reasons to take this   famotidine 20 MG tablet Commonly known as: Pepcid Take 1 tablet (20 mg total) by mouth 2 (  two) times daily.   fluticasone 50 MCG/ACT nasal spray Commonly known as: FLONASE Place 1 spray into both nostrils daily as needed for allergies or rhinitis.   gabapentin 100 MG capsule Commonly known as: NEURONTIN Take 100 mg by mouth 2 (two) times daily.   latanoprost 0.005 %  ophthalmic solution Commonly known as: XALATAN Place 1 drop into the left eye at bedtime.   lidocaine 5 % Commonly known as: LIDODERM Place 1 patch onto the skin daily as needed (for pain- remove old patch first).   midodrine 5 MG tablet Commonly known as: PROAMATINE Take 2 tablets (10 mg total) by mouth 2 (two) times daily with a meal. What changed: how much to take   nystatin 100000 UNIT/ML suspension Commonly known as: MYCOSTATIN Use as directed 5 mLs in the mouth or throat daily as needed (FOR THRUSH).   ondansetron 4 MG disintegrating tablet Commonly known as: ZOFRAN-ODT Take 1 tablet (4 mg total) by mouth every 8 (eight) hours as needed for nausea or vomiting.   predniSONE 5 MG tablet Commonly known as: DELTASONE Take 1 tablet (5 mg total) by mouth daily with breakfast. Start taking on: August 25, 2019 What changed:   medication strength  how much to take  when to take this  additional instructions   Proventil HFA 108 (90 Base) MCG/ACT inhaler Generic drug: albuterol Inhale 2 puffs into the lungs 4 (four) times daily as needed for shortness of breath.   tacrolimus 1 MG capsule Commonly known as: PROGRAF Take 2-3 mg by mouth See admin instructions. Take 3 mg by mouth in the morning and 2 mg at bedtime   Vitamin D (Ergocalciferol) 1.25 MG (50000 UNIT) Caps capsule Commonly known as: DRISDOL Take 50,000 Units by mouth every Sunday.   zolpidem 10 MG tablet Commonly known as: AMBIEN Take 10 mg by mouth at bedtime as needed for sleep.     ASK your doctor about these medications   HYDROmorphone 2 MG tablet Commonly known as: Dilaudid Take 1 tablet (2 mg total) by mouth every 12 (twelve) hours as needed for up to 5 days for severe pain. Ask about: Should I take this medication?            Discharge Care Instructions  (From admission, onward)         Start     Ordered   08/24/19 0000  Discharge wound care:       Comments: Evaluate wound daily with  dressing changes every 24-48 hours.   08/24/19 1106   08/08/19 0000  Discharge wound care:       Comments: Continue wound care as before   08/08/19 1233          Follow-up Information    [name], FNP. Schedule an appointment as soon as possible for a visit in 1 week(s).   Specialty: Family Medicine Contact information: [place] [address] [phone]        Clydell Hakim, MD. Schedule an appointment as soon as possible for a visit in 2 week(s).   Specialty: Anesthesiology Contact information: [address] [place] [address] [place] [address] [phone]        Lahoma Rocker, MD. Schedule an appointment as soon as possible for a visit in 4 week(s).   Specialty: Rheumatology Contact information: [address] [place] [address] [phone]        Geralynn Rile, MD Follow up in 1 week(s).   Specialties:  Internal Medicine, Cardiology, Radiology Contact information: [place] [address] [phone]              Allergies  Allergen Reactions  . Levofloxacin Itching and Rash  . Tape Other (See Comments)    "Certain surgical tapes peel off my skin"    Consultations:  Nephrology   Procedures/Studies: DG Chest Port 1 View  Result Date: 08/05/2019 CLINICAL DATA:  Fever. Dialysis in the RIGHT leg. Swelling of the RIGHT leg yesterday with progressing pain today. Patient missed dialysis today. EXAM: PORTABLE CHEST 1 VIEW COMPARISON:  07/07/2019 FINDINGS: Heart size is within normal limits. Interval development of LEFT LOWER lobe consolidation/infiltrate with associated air bronchograms. Increased LEFT pleural effusion. Prominent interstitial markings are consistent with mild pulmonary edema and are increased. SVC stent. IMPRESSION: 1. Interval development of LEFT LOWER lobe consolidation/infiltrate and LEFT pleural effusion. 2. Increased interstitial pulmonary edema. Electronically  Signed   By: Nolon Nations M.D.   On: 08/05/2019 12:53     Discharge Exam: Vitals:   08/23/19 1900 08/24/19 0709  BP: 101/67 103/62  Pulse: 80 73  Resp:    Temp: 98.7 F (37.1 C) 98.8 F (37.1 C)  SpO2: 98% 97%   Vitals:   08/23/19 1701 08/23/19 1732 08/23/19 1900 08/24/19 0709  BP: 103/67 107/70 101/67 103/62  Pulse: 80 80 80 73  Resp:      Temp: 98.3 F (36.8 C) 98.7 F (37.1 C) 98.7 F (37.1 C) 98.8 F (37.1 C)  TempSrc: Oral Oral Oral Oral  SpO2: 100% 98% 98% 97%  Weight:      Height:        General: Pt is alert, awake, not in acute distress Cardiovascular: RRR, S1/S2 +, no rubs, no gallops Respiratory: CTA bilaterally, no wheezing, no rhonchi Abdominal: Soft, NT, ND, bowel sounds + Extremities: no edema, no cyanosis    The results of significant diagnostics from this hospitalization (including imaging, microbiology, ancillary and laboratory) are listed below for reference.     Microbiology: No results found for this or any previous visit (from the past 240 hour(s)).   Labs: BNP (last 3 results) Recent Labs    04/17/19 1718 05/17/19 0235 07/07/19 1033  BNP 481.5* 506.4* 798.9*   Basic Metabolic Panel: Recent Labs  Lab 08/20/19 0306 08/21/19 1044 08/22/19 0321 08/23/19 0245 08/24/19 0234  NA 130* 131* 129* 134* 133*  K 4.3 4.0 4.6 3.8 3.9  CL 96* 94* 94* 97* 96*  CO2 25 24 24 26 24   GLUCOSE 112* 103* 98 102* 84  BUN 32* 51* 68* 38* 59*  CREATININE 3.55* 4.82* 5.32* 3.22* 4.34*  CALCIUM 8.6* 9.2 8.7* 8.6* 9.2  PHOS 4.4 4.9* 5.1* 3.5 3.3   Liver Function Tests: Recent Labs  Lab 08/20/19 0306 08/21/19 1044 08/22/19 0321 08/23/19 0245 08/24/19 0234  ALBUMIN 2.8* 3.0* 2.8* 3.3* 3.4*   No results for input(s): LIPASE, AMYLASE in the last 168 hours. No results for input(s): AMMONIA in the last 168 hours. CBC: Recent Labs  Lab 08/19/19 0502 08/19/19 0502 08/20/19 0306 08/21/19 1044 08/22/19 0321 08/23/19 0245 08/23/19 2150   WBC 10.5  --  10.7* 10.3 9.5 10.7*  --   NEUTROABS 9.2*  --  9.7* 8.8* 8.3* 9.4*  --   HGB 9.2*   < > 8.5* 8.1* 8.0* 7.4* 9.2*  HCT 29.2*   < > 27.2* 25.9* 24.9* 22.8* 28.7*  MCV 103.2*  --  102.6* 101.6* 100.4* 101.8*  --  PLT 115*  --  103* 97* 91* 83*  --    < > = values in this interval not displayed.   Cardiac Enzymes: No results for input(s): CKTOTAL, CKMB, CKMBINDEX, TROPONINI in the last 168 hours. BNP: Invalid input(s): POCBNP CBG: No results for input(s): GLUCAP in the last 168 hours. D-Dimer No results for input(s): DDIMER in the last 72 hours. Hgb A1c No results for input(s): HGBA1C in the last 72 hours. Lipid Profile No results for input(s): CHOL, HDL, LDLCALC, TRIG, CHOLHDL, LDLDIRECT in the last 72 hours. Thyroid function studies No results for input(s): TSH, T4TOTAL, T3FREE, THYROIDAB in the last 72 hours.  Invalid input(s): FREET3 Anemia work up No results for input(s): VITAMINB12, FOLATE, FERRITIN, TIBC, IRON, RETICCTPCT in the last 72 hours. Urinalysis    Component Value Date/Time   COLORURINE AMBER (A) 08/05/2019 1835   APPEARANCEUR HAZY (A) 08/05/2019 1835   LABSPEC 1.021 08/05/2019 1835   PHURINE 5.0 08/05/2019 1835   GLUCOSEU NEGATIVE 08/05/2019 1835   HGBUR SMALL (A) 08/05/2019 1835   BILIRUBINUR NEGATIVE 08/05/2019 1835   BILIRUBINUR Negative 01/04/2019 1030   [place] 08/05/2019 1835   PROTEINUR 30 (A) 08/05/2019 1835   UROBILINOGEN 0.2 01/04/2019 1030   UROBILINOGEN 0.2 10/15/2014 0946   NITRITE NEGATIVE 08/05/2019 1835   LEUKOCYTESUR NEGATIVE 08/05/2019 1835   Sepsis Labs Invalid input(s): PROCALCITONIN,  WBC,  LACTICIDVEN Microbiology No results found for this or any previous visit (from the past 240 hour(s)).   Time coordinating discharge: 35 minutes  SIGNED:   Rodena Goldmann, DO [facility] 08/24/2019, 11:08 AM  If 7PM-7AM, please contact night-coverage [email or web address]

## 2019-08-24 NOTE — Progress Notes (Signed)
Patient ID: Tina Watkins, female   DOB: 02-16-1965, 54 y.o.   MRN: [id number]  [facility] Progress Note   Assessment/ Plan:   1.  Healthcare associated pneumonia: Status post completion of intravenous/oral antibiotics for suspected left lower lobe pneumonia.  Recent deconditioning after hospitalizations and initiation of HD for ESRD noted--awaiting SNF for continued outpatient OT/PT which she says is now arranged.  2. ESRD: Recently started on hemodialysis after progressive renal allograft failure > TTS GO clinic.  We will continue with dialysis on a TTS schedule.  Right femoral HD catheter with essentially end-stage access situation due to multifocal CV stenosis.  3. Anemia of ESRD: Continue ESA - increased aranesp to 150 mcg every Tuesday for now.  1 unit of PRBC on 7/21 given; Hb improved 7.4 to 9.2  4. CKD-MBD: Calcium level acceptable with improving phosphorus; now on calcium acetate    5.  Acute gout flare: Continue with prednisone taper, uric acid lowering therapy with allopurinol per primary team.  6. Hypotension: Continue midodrine for blood pressure support and monitor with UF on HD.   7. Immunosuppression - on immunosuppression (pred 5 mg daily and tacrolimus) with hx of renal transplant  Dispo: ok for discharge post HD today if SNF available -- spoke with HD unit, will be done early 2nd shift.  D/w pt as well.     Subjective:   Felt sleepy this AM with benadryl, o/w ok. Says has SNF arranged now. Looking forward to feeling strongher.    Objective:   BP 103/62 (BP Location: Left Arm)   Pulse 73   Temp 98.8 F (37.1 C) (Oral)   Resp 18   Ht 4\' 11"  (1.499 m)   Wt 51.4 kg   SpO2 97%   BMI 22.89 kg/m   Physical Exam: Gen: adult female in bed in NAD , sleepy CVS: S1 and S2 no rub Resp: Diminished breath sounds over bases; unlabored Abd: Soft, flat, nontender Ext: Trace bilateral lower extremity edema.   Neuro alert and oriented x3 provides hx  and follows commands Psych normal mood and affect Access: Right femoral HD catheter.  Labs: BMET Recent Labs  Lab 08/18/19 0311 08/19/19 0502 08/20/19 0306 08/21/19 1044 08/22/19 0321 08/23/19 0245 08/24/19 0234  NA 133* 134* 130* 131* 129* 134* 133*  K 3.9 3.8 4.3 4.0 4.6 3.8 3.9  CL 98 98 96* 94* 94* 97* 96*  CO2 22 22 25 24 24 26 24   GLUCOSE 88 101* 112* 103* 98 102* 84  BUN 36* 52* 32* 51* 68* 38* 59*  CREATININE 3.73* 5.05* 3.55* 4.82* 5.32* 3.22* 4.34*  CALCIUM 8.8* 8.6* 8.6* 9.2 8.7* 8.6* 9.2  PHOS 4.7* 5.6* 4.4 4.9* 5.1* 3.5 3.3   CBC Recent Labs  Lab 08/20/19 0306 08/20/19 0306 08/21/19 1044 08/22/19 0321 08/23/19 0245 08/23/19 2150  WBC 10.7*  --  10.3 9.5 10.7*  --   NEUTROABS 9.7*  --  8.8* 8.3* 9.4*  --   HGB 8.5*   < > 8.1* 8.0* 7.4* 9.2*  HCT 27.2*   < > 25.9* 24.9* 22.8* 28.7*  MCV 102.6*  --  101.6* 100.4* 101.8*  --   PLT 103*  --  97* 91* 83*  --    < > = values in this interval not displayed.      Medications:    . allopurinol  100 mg Oral Daily  . aspirin EC  81 mg Oral Daily  . calcium acetate  667 mg Oral  TID WC  . Chlorhexidine Gluconate Cloth  6 each Topical Q0600  . Chlorhexidine Gluconate Cloth  6 each Topical Q0600  . Chlorhexidine Gluconate Cloth  6 each Topical Q0600  . darbepoetin (ARANESP) injection - DIALYSIS  150 mcg Intravenous Q Tue-HD  . famotidine  20 mg Oral Daily  . feeding supplement (NEPRO CARB STEADY)  237 mL Oral BID BM  . feeding supplement (PRO-STAT SUGAR FREE 64)  30 mL Oral BID  . gabapentin  100 mg Oral TID  . guaiFENesin  600 mg Oral BID  . latanoprost  1 drop Left Eye QHS  . midodrine  10 mg Oral TID WC  . multivitamin  1 tablet Oral QHS  . predniSONE  10 mg Oral Q breakfast  . tacrolimus  2 mg Oral QHS  . tacrolimus  3 mg Oral q morning - 10a  . Vitamin D (Ergocalciferol)  50,000 Units Oral Q Ferman Hamming, MD 08/24/2019, 10:18 AM

## 2019-08-24 NOTE — TOC Transition Note (Addendum)
Transition of Care [facility]) - CM/SW Discharge Note   Patient Details  Name: Tina Watkins MRN: [id number] Date of Birth: Sep 05, 1965  Transition of Care [facility] CM/SW Contact:  Angelita Ingles, RN Phone Number: [phone]  08/24/2019, 12:29 PM   Clinical Narrative:    CM received call from [facility] at [facility] to make her aware that insurance authorization has been received. Patient to be discharged later today after hemodialysis treatment. Covid test is currently pending and  Results will be needed before patient can be discharged to [facility]. Patient has been updated and is ok with the plan. CM will continue to follow for discharge needs.    Patient has agreed to allow uncle Lars Pinks sign paperwork for [facility] Contact info given to [facility]. Patient will be ready for discharge upon return from hemodialysis. Message has been sent to primary nurse to put script for dilaudid in the d/c packet. D/c packet is at the nurses station. CM will sign off   Please call report to [place] Room # [place] til 5pm if CM is not here PTAR after hours after 5pm [phone] SS # [id number]   Final next level of care: [place] Barriers to Discharge: No Barriers Identified   Patient Goals and CMS Choice Patient states their goals for this hospitalization and ongoing recovery are:: Wants to go to rehab asap CMS Medicare.gov Compare Post Acute Care list provided to:: Patient Choice offered to / list presented to : Patient  Discharge Placement   Existing PASRR number confirmed : 08/24/19            Patient to be transferred to facility by: [facility] Name of family member notified: Patient declined states she will notify her family Patient and family notified of of transfer: 08/24/19  Discharge Plan and Services                DME Arranged: N/A DME Agency: NA       HH Arranged: NA [place] Agency:  NA        Social Determinants of Health ([place]) Interventions     Readmission Risk Interventions Readmission Risk Prevention Plan 05/18/2019  Transportation Screening Complete  PCP or Specialist Appt within 3-5 Days Complete  HRI or [place] (No Data)  Medication Review Press photographer) Complete  Some recent data might be hidden

## 2019-08-24 NOTE — Progress Notes (Signed)
Pt discharged to [facility] facility via ambulance transport. VSS, belongings including cell phone, charger, and glasses sent with patient. Discharge paper work updated, bedtime meds and last dose of Dilaudid documented on paper MAR.

## 2019-08-26 DIAGNOSIS — N2581 Secondary hyperparathyroidism of renal origin: Secondary | ICD-10-CM | POA: Diagnosis not present

## 2019-08-26 DIAGNOSIS — T8249XA Other complication of vascular dialysis catheter, initial encounter: Secondary | ICD-10-CM | POA: Diagnosis not present

## 2019-08-26 DIAGNOSIS — Z992 Dependence on renal dialysis: Secondary | ICD-10-CM | POA: Diagnosis not present

## 2019-08-26 DIAGNOSIS — D688 Other specified coagulation defects: Secondary | ICD-10-CM | POA: Diagnosis not present

## 2019-08-26 DIAGNOSIS — N186 End stage renal disease: Secondary | ICD-10-CM | POA: Diagnosis not present

## 2019-08-26 DIAGNOSIS — Z23 Encounter for immunization: Secondary | ICD-10-CM | POA: Diagnosis not present

## 2019-08-26 DIAGNOSIS — D631 Anemia in chronic kidney disease: Secondary | ICD-10-CM | POA: Diagnosis not present

## 2019-08-29 DIAGNOSIS — Z992 Dependence on renal dialysis: Secondary | ICD-10-CM | POA: Diagnosis not present

## 2019-08-29 DIAGNOSIS — T8249XA Other complication of vascular dialysis catheter, initial encounter: Secondary | ICD-10-CM | POA: Diagnosis not present

## 2019-08-29 DIAGNOSIS — Z23 Encounter for immunization: Secondary | ICD-10-CM | POA: Diagnosis not present

## 2019-08-29 DIAGNOSIS — N186 End stage renal disease: Secondary | ICD-10-CM | POA: Diagnosis not present

## 2019-08-29 DIAGNOSIS — D688 Other specified coagulation defects: Secondary | ICD-10-CM | POA: Diagnosis not present

## 2019-08-29 DIAGNOSIS — N2581 Secondary hyperparathyroidism of renal origin: Secondary | ICD-10-CM | POA: Diagnosis not present

## 2019-08-29 DIAGNOSIS — D631 Anemia in chronic kidney disease: Secondary | ICD-10-CM | POA: Diagnosis not present

## 2019-08-30 DIAGNOSIS — I5032 Chronic diastolic (congestive) heart failure: Secondary | ICD-10-CM | POA: Diagnosis not present

## 2019-08-30 DIAGNOSIS — K219 Gastro-esophageal reflux disease without esophagitis: Secondary | ICD-10-CM | POA: Diagnosis not present

## 2019-08-30 DIAGNOSIS — D631 Anemia in chronic kidney disease: Secondary | ICD-10-CM | POA: Diagnosis not present

## 2019-08-30 DIAGNOSIS — M5090 Cervical disc disorder, unspecified, unspecified cervical region: Secondary | ICD-10-CM | POA: Diagnosis not present

## 2019-08-30 DIAGNOSIS — Z8673 Personal history of transient ischemic attack (TIA), and cerebral infarction without residual deficits: Secondary | ICD-10-CM | POA: Diagnosis not present

## 2019-08-30 DIAGNOSIS — Z8616 Personal history of COVID-19: Secondary | ICD-10-CM | POA: Diagnosis not present

## 2019-08-30 DIAGNOSIS — D696 Thrombocytopenia, unspecified: Secondary | ICD-10-CM | POA: Diagnosis not present

## 2019-08-30 DIAGNOSIS — Z992 Dependence on renal dialysis: Secondary | ICD-10-CM | POA: Diagnosis not present

## 2019-08-30 DIAGNOSIS — N186 End stage renal disease: Secondary | ICD-10-CM | POA: Diagnosis not present

## 2019-08-30 DIAGNOSIS — M109 Gout, unspecified: Secondary | ICD-10-CM | POA: Diagnosis not present

## 2019-08-30 DIAGNOSIS — J189 Pneumonia, unspecified organism: Secondary | ICD-10-CM | POA: Diagnosis not present

## 2019-08-30 DIAGNOSIS — F419 Anxiety disorder, unspecified: Secondary | ICD-10-CM | POA: Diagnosis not present

## 2019-08-30 DIAGNOSIS — I959 Hypotension, unspecified: Secondary | ICD-10-CM | POA: Diagnosis not present

## 2019-08-31 DIAGNOSIS — N2581 Secondary hyperparathyroidism of renal origin: Secondary | ICD-10-CM | POA: Diagnosis not present

## 2019-08-31 DIAGNOSIS — J189 Pneumonia, unspecified organism: Secondary | ICD-10-CM | POA: Diagnosis not present

## 2019-08-31 DIAGNOSIS — I5030 Unspecified diastolic (congestive) heart failure: Secondary | ICD-10-CM | POA: Diagnosis not present

## 2019-08-31 DIAGNOSIS — M109 Gout, unspecified: Secondary | ICD-10-CM | POA: Diagnosis not present

## 2019-08-31 DIAGNOSIS — D688 Other specified coagulation defects: Secondary | ICD-10-CM | POA: Diagnosis not present

## 2019-08-31 DIAGNOSIS — Z23 Encounter for immunization: Secondary | ICD-10-CM | POA: Diagnosis not present

## 2019-08-31 DIAGNOSIS — D631 Anemia in chronic kidney disease: Secondary | ICD-10-CM | POA: Diagnosis not present

## 2019-08-31 DIAGNOSIS — Z992 Dependence on renal dialysis: Secondary | ICD-10-CM | POA: Diagnosis not present

## 2019-08-31 DIAGNOSIS — N186 End stage renal disease: Secondary | ICD-10-CM | POA: Diagnosis not present

## 2019-08-31 DIAGNOSIS — T8249XA Other complication of vascular dialysis catheter, initial encounter: Secondary | ICD-10-CM | POA: Diagnosis not present

## 2019-09-01 ENCOUNTER — Encounter (HOSPITAL_COMMUNITY): Payer: Self-pay | Admitting: Emergency Medicine

## 2019-09-01 ENCOUNTER — Inpatient Hospital Stay (HOSPITAL_COMMUNITY)
Admission: EM | Admit: 2019-09-01 | Discharge: 2019-09-03 | DRG: 698 | Disposition: A | Discharge status: Skilled Nursing Facility | Payer: Medicare Other | Source: Skilled Nursing Facility | Attending: Internal Medicine | Admitting: Internal Medicine

## 2019-09-01 ENCOUNTER — Other Ambulatory Visit: Payer: Self-pay

## 2019-09-01 DIAGNOSIS — R531 Weakness: Secondary | ICD-10-CM | POA: Diagnosis not present

## 2019-09-01 DIAGNOSIS — Z940 Kidney transplant status: Secondary | ICD-10-CM

## 2019-09-01 DIAGNOSIS — T8612 Kidney transplant failure: Secondary | ICD-10-CM | POA: Diagnosis not present

## 2019-09-01 DIAGNOSIS — Z20822 Contact with and (suspected) exposure to covid-19: Secondary | ICD-10-CM | POA: Diagnosis present

## 2019-09-01 DIAGNOSIS — Z7952 Long term (current) use of systemic steroids: Secondary | ICD-10-CM

## 2019-09-01 DIAGNOSIS — Z820 Family history of epilepsy and other diseases of the nervous system: Secondary | ICD-10-CM

## 2019-09-01 DIAGNOSIS — I9589 Other hypotension: Secondary | ICD-10-CM | POA: Diagnosis present

## 2019-09-01 DIAGNOSIS — M5030 Other cervical disc degeneration, unspecified cervical region: Secondary | ICD-10-CM | POA: Diagnosis present

## 2019-09-01 DIAGNOSIS — F17210 Nicotine dependence, cigarettes, uncomplicated: Secondary | ICD-10-CM | POA: Diagnosis present

## 2019-09-01 DIAGNOSIS — H5461 Unqualified visual loss, right eye, normal vision left eye: Secondary | ICD-10-CM | POA: Diagnosis present

## 2019-09-01 DIAGNOSIS — Z79899 Other long term (current) drug therapy: Secondary | ICD-10-CM

## 2019-09-01 DIAGNOSIS — Y830 Surgical operation with transplant of whole organ as the cause of abnormal reaction of the patient, or of later complication, without mention of misadventure at the time of the procedure: Secondary | ICD-10-CM | POA: Diagnosis present

## 2019-09-01 DIAGNOSIS — Z83511 Family history of glaucoma: Secondary | ICD-10-CM

## 2019-09-01 DIAGNOSIS — I10 Essential (primary) hypertension: Secondary | ICD-10-CM | POA: Diagnosis not present

## 2019-09-01 DIAGNOSIS — M797 Fibromyalgia: Secondary | ICD-10-CM | POA: Diagnosis present

## 2019-09-01 DIAGNOSIS — R404 Transient alteration of awareness: Secondary | ICD-10-CM | POA: Diagnosis not present

## 2019-09-01 DIAGNOSIS — N186 End stage renal disease: Secondary | ICD-10-CM | POA: Diagnosis present

## 2019-09-01 DIAGNOSIS — D649 Anemia, unspecified: Secondary | ICD-10-CM | POA: Diagnosis present

## 2019-09-01 DIAGNOSIS — Z888 Allergy status to other drugs, medicaments and biological substances status: Secondary | ICD-10-CM

## 2019-09-01 DIAGNOSIS — I4891 Unspecified atrial fibrillation: Secondary | ICD-10-CM | POA: Diagnosis present

## 2019-09-01 DIAGNOSIS — M109 Gout, unspecified: Secondary | ICD-10-CM | POA: Diagnosis present

## 2019-09-01 DIAGNOSIS — Z8249 Family history of ischemic heart disease and other diseases of the circulatory system: Secondary | ICD-10-CM

## 2019-09-01 DIAGNOSIS — N189 Chronic kidney disease, unspecified: Secondary | ICD-10-CM | POA: Diagnosis present

## 2019-09-01 DIAGNOSIS — R52 Pain, unspecified: Secondary | ICD-10-CM | POA: Diagnosis not present

## 2019-09-01 DIAGNOSIS — G8929 Other chronic pain: Secondary | ICD-10-CM | POA: Diagnosis present

## 2019-09-01 DIAGNOSIS — Z981 Arthrodesis status: Secondary | ICD-10-CM

## 2019-09-01 DIAGNOSIS — Z8711 Personal history of peptic ulcer disease: Secondary | ICD-10-CM

## 2019-09-01 DIAGNOSIS — Z881 Allergy status to other antibiotic agents status: Secondary | ICD-10-CM

## 2019-09-01 DIAGNOSIS — Z7982 Long term (current) use of aspirin: Secondary | ICD-10-CM

## 2019-09-01 DIAGNOSIS — D631 Anemia in chronic kidney disease: Secondary | ICD-10-CM | POA: Diagnosis present

## 2019-09-01 DIAGNOSIS — H409 Unspecified glaucoma: Secondary | ICD-10-CM | POA: Diagnosis present

## 2019-09-01 DIAGNOSIS — Z800 Family history of malignant neoplasm of digestive organs: Secondary | ICD-10-CM

## 2019-09-01 DIAGNOSIS — D638 Anemia in other chronic diseases classified elsewhere: Secondary | ICD-10-CM | POA: Diagnosis present

## 2019-09-01 DIAGNOSIS — D61818 Other pancytopenia: Secondary | ICD-10-CM | POA: Diagnosis present

## 2019-09-01 DIAGNOSIS — Z992 Dependence on renal dialysis: Secondary | ICD-10-CM

## 2019-09-01 DIAGNOSIS — I959 Hypotension, unspecified: Secondary | ICD-10-CM | POA: Diagnosis not present

## 2019-09-01 DIAGNOSIS — F329 Major depressive disorder, single episode, unspecified: Secondary | ICD-10-CM | POA: Diagnosis present

## 2019-09-01 DIAGNOSIS — K219 Gastro-esophageal reflux disease without esophagitis: Secondary | ICD-10-CM | POA: Diagnosis present

## 2019-09-01 DIAGNOSIS — N185 Chronic kidney disease, stage 5: Secondary | ICD-10-CM | POA: Diagnosis not present

## 2019-09-01 LAB — BASIC METABOLIC PANEL
Anion gap: 13 (ref 5–15)
BUN: 18 mg/dL (ref 6–20)
CO2: 25 mmol/L (ref 22–32)
Calcium: 8.9 mg/dL (ref 8.9–10.3)
Chloride: 99 mmol/L (ref 98–111)
Creatinine, Ser: 4.57 mg/dL — ABNORMAL HIGH (ref 0.44–1.00)
GFR calc Af Amer: 12 mL/min — ABNORMAL LOW (ref >60)
GFR calc non Af Amer: 10 mL/min — ABNORMAL LOW (ref >60)
Glucose, Bld: 97 mg/dL (ref 70–99)
Potassium: 3.5 mmol/L (ref 3.5–5.1)
Sodium: 137 mmol/L (ref 135–145)

## 2019-09-01 LAB — CBC
HCT: 24.2 % — ABNORMAL LOW (ref 36.0–46.0)
Hemoglobin: 7.2 g/dL — ABNORMAL LOW (ref 12.0–15.0)
MCH: 33.2 pg (ref 26.0–34.0)
MCHC: 29.8 g/dL — ABNORMAL LOW (ref 30.0–36.0)
MCV: 111.5 fL — ABNORMAL HIGH (ref 80.0–100.0)
Platelets: 111 10*3/uL — ABNORMAL LOW (ref 150–400)
RBC: 2.17 MIL/uL — ABNORMAL LOW (ref 3.87–5.11)
RDW: 22.6 % — ABNORMAL HIGH (ref 11.5–15.5)
WBC: 7.1 10*3/uL (ref 4.0–10.5)
nRBC: 0.3 % — ABNORMAL HIGH (ref 0.0–0.2)

## 2019-09-01 LAB — SARS CORONAVIRUS 2 BY RT PCR (HOSPITAL ORDER, PERFORMED IN ~~LOC~~ HOSPITAL LAB): SARS Coronavirus 2: NEGATIVE

## 2019-09-01 LAB — PREPARE RBC (CROSSMATCH)

## 2019-09-01 LAB — CBG MONITORING, ED: Glucose-Capillary: 105 mg/dL — ABNORMAL HIGH (ref 70–99)

## 2019-09-01 MED ORDER — FAMOTIDINE 20 MG PO TABS
20.0000 mg | ORAL_TABLET | Freq: Every day | ORAL | Status: DC
  Administered 2019-09-02 – 2019-09-03 (×2): 20 mg via ORAL
  Filled 2019-09-01 (×2): qty 1

## 2019-09-01 MED ORDER — ACETAMINOPHEN 650 MG RE SUPP: 650.0000 mg | Freq: Four times a day (QID) | RECTAL | Status: DC | PRN

## 2019-09-01 MED ORDER — ZOLPIDEM TARTRATE 5 MG PO TABS
5.0000 mg | ORAL_TABLET | Freq: Every evening | ORAL | Status: DC | PRN
  Administered 2019-09-02: 5 mg via ORAL
  Filled 2019-09-01: qty 1

## 2019-09-01 MED ORDER — SODIUM CHLORIDE 0.9% FLUSH
3.0000 mL | Freq: Once | INTRAVENOUS | Status: AC
  Administered 2019-09-01: 3 mL via INTRAVENOUS

## 2019-09-01 MED ORDER — ONDANSETRON 4 MG PO TBDP
4.0000 mg | ORAL_TABLET | Freq: Three times a day (TID) | ORAL | Status: DC | PRN
  Administered 2019-09-02 – 2019-09-03 (×2): 4 mg via ORAL
  Filled 2019-09-01 (×5): qty 1

## 2019-09-01 MED ORDER — ACETAMINOPHEN 325 MG PO TABS: 650.0000 mg | ORAL_TABLET | Freq: Four times a day (QID) | ORAL | Status: DC | PRN

## 2019-09-01 MED ORDER — LATANOPROST 0.005 % OP SOLN
1.0000 [drp] | Freq: Every day | OPHTHALMIC | Status: DC
  Administered 2019-09-01 – 2019-09-03 (×2): 1 [drp] via OPHTHALMIC
  Filled 2019-09-01: qty 2.5

## 2019-09-01 MED ORDER — MIDODRINE HCL 5 MG PO TABS: 10.0000 mg | ORAL_TABLET | Freq: Two times a day (BID) | ORAL | Status: DC

## 2019-09-01 MED ORDER — GABAPENTIN 100 MG PO CAPS
100.0000 mg | ORAL_CAPSULE | Freq: Two times a day (BID) | ORAL | Status: DC
  Administered 2019-09-01 – 2019-09-03 (×4): 100 mg via ORAL
  Filled 2019-09-01 (×4): qty 1

## 2019-09-01 MED ORDER — ALLOPURINOL 100 MG PO TABS
100.0000 mg | ORAL_TABLET | Freq: Every day | ORAL | Status: DC
  Administered 2019-09-02 – 2019-09-03 (×2): 100 mg via ORAL
  Filled 2019-09-01 (×2): qty 1

## 2019-09-01 MED ORDER — SODIUM CHLORIDE 0.9 % IV BOLUS
250.0000 mL | Freq: Once | INTRAVENOUS | Status: AC
  Administered 2019-09-01: 250 mL via INTRAVENOUS

## 2019-09-01 MED ORDER — TACROLIMUS 1 MG PO CAPS: 2.0000 mg | ORAL_CAPSULE | ORAL | Status: DC

## 2019-09-01 MED ORDER — LIDOCAINE 5 % EX PTCH: 1.0000 | MEDICATED_PATCH | Freq: Every day | CUTANEOUS | Status: DC | PRN

## 2019-09-01 MED ORDER — SODIUM CHLORIDE 0.9% IV SOLUTION: Freq: Once | INTRAVENOUS | Status: AC

## 2019-09-01 MED ORDER — CALCIUM ACETATE (PHOS BINDER) 667 MG PO CAPS
667.0000 mg | ORAL_CAPSULE | Freq: Three times a day (TID) | ORAL | Status: DC
  Administered 2019-09-02 – 2019-09-03 (×5): 667 mg via ORAL
  Filled 2019-09-01 (×6): qty 1

## 2019-09-01 MED ORDER — FLUTICASONE PROPIONATE 50 MCG/ACT NA SUSP: 1.0000 | Freq: Every day | NASAL | Status: DC | PRN

## 2019-09-01 MED ORDER — OXYCODONE-ACETAMINOPHEN 5-325 MG PO TABS
2.0000 | ORAL_TABLET | Freq: Once | ORAL | Status: AC
  Administered 2019-09-01: 2 via ORAL
  Filled 2019-09-01: qty 2

## 2019-09-01 MED ORDER — PREDNISONE 5 MG PO TABS
5.0000 mg | ORAL_TABLET | Freq: Every day | ORAL | Status: DC
  Administered 2019-09-02 – 2019-09-03 (×2): 5 mg via ORAL
  Filled 2019-09-01 (×3): qty 1

## 2019-09-01 MED ORDER — TACROLIMUS 1 MG PO CAPS
2.0000 mg | ORAL_CAPSULE | Freq: Every day | ORAL | Status: DC
  Administered 2019-09-01 – 2019-09-03 (×2): 2 mg via ORAL
  Filled 2019-09-01 (×3): qty 2

## 2019-09-01 MED ORDER — ALBUTEROL SULFATE (2.5 MG/3ML) 0.083% IN NEBU: 2.5000 mg | INHALATION_SOLUTION | Freq: Four times a day (QID) | RESPIRATORY_TRACT | Status: DC | PRN

## 2019-09-01 MED ORDER — ASPIRIN EC 81 MG PO TBEC
81.0000 mg | DELAYED_RELEASE_TABLET | Freq: Every day | ORAL | Status: DC
  Administered 2019-09-02 – 2019-09-03 (×2): 81 mg via ORAL
  Filled 2019-09-01 (×2): qty 1

## 2019-09-01 MED ORDER — TACROLIMUS 1 MG PO CAPS
3.0000 mg | ORAL_CAPSULE | Freq: Every day | ORAL | Status: DC
  Administered 2019-09-02 – 2019-09-03 (×2): 3 mg via ORAL
  Filled 2019-09-01 (×2): qty 3

## 2019-09-01 MED ORDER — COLCHICINE 0.6 MG PO TABS
0.3000 mg | ORAL_TABLET | Freq: Every day | ORAL | Status: DC
  Administered 2019-09-02 – 2019-09-03 (×2): 0.3 mg via ORAL
  Filled 2019-09-01 (×2): qty 0.5

## 2019-09-01 NOTE — Significant Event (Addendum)
HOSPITAL MEDICINE OVERNIGHT EVENT NOTE  Notified by nursing patient is adamantly reuquesting opiate based analgesics for "chronic generalized pain."    Chart reviewed, current BP in the 46'F systolic.  Previous hospitalization reviewed.  Pressures usually range from upper 80's to 100's at baseline.    I informed nursing it is unsafe to give opiate based analgesics at this time.  Will give small NS bolus and reassess BP.  If SBP is consistently above 90 or 100 will give opiate based analgesics at that time.   [place]  Notified by nursing that BP has improved with SBP over 100.  Will administer 10/325mg  oxycodone/acetaminophen PO x 1 per home regimen confirmed via controlled substance database.   Sherryll Burger Niva Murren

## 2019-09-01 NOTE — ED Provider Notes (Signed)
[facility] EMERGENCY DEPARTMENT Provider Note   CSN: [id number] Arrival date & time: 09/01/19  1106     History Chief Complaint  Patient presents with  . Abnormal Lab  . Weakness    Tina Watkins is a 54 y.o. female who presents from rehab for low hemoglobin.  Patient brings with her lab paperwork that shows hemoglobin 6.3 drawn this morning.  Patient endorses feeling weak/fatigued for past week.  Patient states she is required multiple blood transfusions this year for recurrent anemia secondary to ESRD.  Endorses generalized fatigue wo focal extremity weakness. Patient denies fever, URI symptoms, dysuria, hematemesis, hematuria, hematochezia, melena.  Patient denies recent trauma.    Weakness Severity:  Moderate Duration:  1 week Chronicity:  Recurrent Context comment:  Pt with known history of microcytic anemia 2/2 to ESRD Relieved by:  Nothing Worsened by:  Nothing Associated symptoms: no abdominal pain, no anorexia, no chest pain, no cough, no diarrhea, no dysuria, no fever, no headaches, no hematochezia, no loss of consciousness, no melena, no nausea, no near-syncope, no shortness of breath, no syncope, no vision change and no vomiting   Risk factors: anemia and congestive heart failure   Risk factors comment:  ESRD on HD        Past Medical History:  Diagnosis Date  . Bacteremia due to Gram-negative bacteria 05/23/2011  . Blind    right eye  . Blind right eye   . CHF (congestive heart failure) ([place])   . Chronic lower back pain   . Complication of anesthesia    "woke up during OR; I have an extremely high tolerance" (12/11/2011) 1 procedure was graft; the other procedures were procedures that are typically done with sedation.  . DDD (degenerative disc disease), cervical   . Depression   . Dysrhythmia    "tachycardia" (12/11/2011) new onset afib 10/15/14 EKG  . E coli bacteremia 06/18/2011  . Elevated LDL cholesterol level 12/2018  . ESRD  (end stage renal disease) ([place]) 06/12/2011  . Fibromyalgia   . Gastroparesis   . Gastroparesis   . GERD (gastroesophageal reflux disease)   . Glaucoma    right eye  . Gout   . H/O carpal tunnel syndrome   . Headache(784.0)    "not often anymore" (12/11/2011)  . Herpes genitalia 1994  . History of blood transfusion    "more than a few times" (12/11/2011)  . History of stomach ulcers   . Hypotension   . Iron deficiency anemia   . New onset a-fib ([place])    10/15/14 EKG  . Osteopenia   . Peripheral neuropathy 11/2018  . Pressure ulcer of sacral region, stage 1 07/2019  . Seizures ([place]) 1994   "post transplant; only have had that one" (12/11/2011)  . Spinal stenosis in cervical region   . Stroke [facility])     left basal ganglia lacunar infarct; Right frontal lobe lacunar infarct.  . Stroke [facility]) ~ 1999; 2001   "briefly lost my vision; lost my right eye" (12/11/2011)  . Vitamin D deficiency 12/2018    Patient Active Problem List   Diagnosis Date Noted  . Decubitus ulcer of sacral region, stage 1 08/18/2019  . HCAP (healthcare-associated pneumonia) 08/05/2019  . Pressure injury of skin 07/24/2019  . Chest congestion 07/24/2019  . Itching 07/24/2019  . Weakness 06/13/2019  . Pneumonia due to COVID-19 virus 05/09/2019  . Hypotension 05/09/2019  . Symptomatic anemia 05/09/2019  . CHF (congestive heart failure) ([place]) 05/09/2019  .  Swollen abdomen 01/04/2019  . DDD (degenerative disc disease), cervical 12/06/2018  . Neuropathy 12/06/2018  . Paresthesia 10/11/2018  . Neck pain 10/11/2018  . Tobacco abuse 11/20/2017  . Right leg swelling 11/20/2017  . CKD (chronic kidney disease), stage III 11/20/2017  . Right leg pain 11/20/2017  . Stroke (cerebrum) ([place]) 11/20/2017  . GERD (gastroesophageal reflux disease) 11/20/2017  . Acute venous embolism and thrombosis of deep vessels of proximal lower extremity, right ([place]) 11/20/2017  . 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 Thrush, oral   . AKI (acute kidney injury) ([place])   . Multifocal pneumonia 08/09/2017  . Chest pain 09/29/2015  . Cervical stenosis of spinal canal 01/08/2015  . Urinary tract infectious disease   . Muscle spasms of neck 06/15/2014  . Bleeding hemorrhoid 06/15/2014  . Anemia in chronic kidney disease 06/15/2014  . Pyelonephritis, acute 06/13/2014  . Sepsis ([place]) 06/13/2014  . History of kidney transplant   . Chronic pain syndrome 04/09/2012  . Dehydration, mild 04/09/2012  . Gout attack 06/23/2011  . Herpes infection 06/23/2011  . Anxiety 06/23/2011  . E coli bacteremia 06/18/2011  . ESRD (end stage renal disease) ([place]) 06/12/2011  . UTI (lower urinary tract infection) 06/12/2011  . Bacteremia due to Gram-negative bacteria 05/23/2011  . History of renal transplantation 05/22/2011  . Septic shock(785.52) 05/22/2011  . Acute on chronic kidney failure ([place]) 05/22/2011  . Gastroparesis 04/24/2008  . WEIGHT LOSS 08/24/2007  . NAUSEA AND VOMITING 08/24/2007    Past Surgical History:  Procedure Laterality Date  . ANTERIOR CERVICAL DECOMP/DISCECTOMY FUSION N/A 01/08/2015   Procedure: Anterior Cervical Three-Four/Four-Five Decompression/Diskectomy/Fusion;  Surgeon: Leeroy Cha, MD;  Location: [facility];  Service: Neurosurgery;  Laterality: N/A;  C3-4 C4-5 Anterior cervical decompression/diskectomy/fusion  . APPENDECTOMY  ~ 2004  . CATARACT EXTRACTION     right eye  . COLONOSCOPY    . ENUCLEATION  2001   "right"  . ESOPHAGOGASTRODUODENOSCOPY (EGD) WITH PROPOFOL N/A 04/21/2012   Procedure: ESOPHAGOGASTRODUODENOSCOPY (EGD) WITH PROPOFOL;  Surgeon: Milus Banister, MD;  Location: [facility];  Service: Endoscopy;  Laterality: N/A;  . 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 IR FLUORO GUIDE CV LINE RIGHT  07/11/2019  . IR RADIOLOGY PERIPHERAL GUIDED IV START  07/11/2019  . IR US GUIDE VASC  ACCESS RIGHT  07/11/2019  . IR US GUIDE VASC ACCESS RIGHT  07/11/2019  . IR US GUIDE VASC ACCESS RIGHT  07/11/2019  . [place]; 1999; 2005   "right"  . MULTIPLE TOOTH EXTRACTIONS    . RIGHT HEART CATH N/A 03/23/2019   Procedure: RIGHT HEART CATH;  Surgeon: Jolaine Artist, MD;  Location: [facility];  Service: Cardiovascular;  Laterality: N/A;  . TONSILLECTOMY    . TOTAL NEPHRECTOMY  1988?; 1994; 2005     OB History    Gravida  1   Para      Term      Preterm      AB  1   Living        SAB  1   TAB      Ectopic      Multiple      Live Births              Family History  Problem Relation Age of Onset  .  Glaucoma Mother   . Pancreatic cancer Father   . Multiple sclerosis Brother   . Hypertension Maternal Grandmother   . Breast cancer Neg Hx     Social History   Tobacco Use  . Smoking status: Current Every Day Smoker    Packs/day: 0.50    Years: 35.00    Pack years: 17.50    Types: Cigarettes  . Smokeless tobacco: Never Used  Vaping Use  . Vaping Use: Never used  Substance Use Topics  . Alcohol use: Yes    Comment: rare  . Drug use: Yes    Types: Marijuana    Comment: occasional use    Home Medications Prior to Admission medications   Medication Sig Start Date End Date Taking? Authorizing Provider  allopurinol (ZYLOPRIM) 100 MG tablet Take 1 tablet (100 mg total) by mouth daily. 08/25/19 09/24/19 Yes Shah, Pratik D, DO  aspirin EC 81 MG tablet Take 81 mg by mouth daily.    Yes [provider]  calcium acetate (PHOSLO) 667 MG capsule Take 1 capsule (667 mg total) by mouth 3 (three) times daily with meals. 08/24/19 09/23/19 Yes Shah, Pratik D, DO  colchicine 0.6 MG tablet Take 0.5 tablets (0.3 mg total) by mouth daily. 08/08/19  Yes [name], MD  diphenhydrAMINE (BENADRYL) 25 mg capsule Take 1 capsule (25 mg total) by mouth every 8 (eight) hours as needed. Patient taking differently: Take 25 mg by mouth every 8  (eight) hours as needed for itching.  07/21/19  Yes [name], FNP  famotidine (PEPCID) 20 MG tablet Take 1 tablet (20 mg total) by mouth 2 (two) times daily. 06/13/19  Yes [name], FNP  gabapentin (NEURONTIN) 100 MG capsule Take 100 mg by mouth 2 (two) times daily.  09/27/18  Yes [provider]  latanoprost (XALATAN) 0.005 % ophthalmic solution Place 1 drop into the left eye at bedtime. 03/30/17  Yes [provider]  lidocaine (LIDODERM) 5 % Place 1 patch onto the skin daily as needed (for pain- remove old patch first).  10/28/18  Yes [provider]  midodrine (PROAMATINE) 10 MG tablet Take 10 mg by mouth 3 (three) times daily.   Yes [provider]  ondansetron (ZOFRAN-ODT) 4 MG disintegrating tablet Take 1 tablet (4 mg total) by mouth every 8 (eight) hours as needed for nausea or vomiting. 06/13/19  Yes [name], FNP  predniSONE (DELTASONE) 5 MG tablet Take 1 tablet (5 mg total) by mouth daily with breakfast. 08/25/19 09/24/19 Yes Shah, Pratik D, DO  tacrolimus (PROGRAF) 1 MG capsule Take 2-3 mg by mouth See admin instructions. Take 3 mg by mouth in the morning and 2 mg at bedtime 06/22/11  Yes [name], MD  Vitamin D, Ergocalciferol, (DRISDOL) 1.25 MG (50000 UNIT) CAPS capsule Take 50,000 Units by mouth every Sunday.    Yes [provider]  zolpidem (AMBIEN) 10 MG tablet Take 1 tablet (10 mg total) by mouth at bedtime as needed for sleep. 08/24/19  Yes [name], Pratik D, DO  midodrine (PROAMATINE) 5 MG tablet Take 2 tablets (10 mg total) by mouth 2 (two) times daily with a meal. Patient not taking: Reported on 09/01/2019 07/17/19   Domenic Polite, MD    Allergies    Levofloxacin and Tape  Review of Systems   Review of Systems  Constitutional: Positive for fatigue. Negative for fever.  HENT: Negative for congestion, rhinorrhea and sore throat.   Eyes: Negative for visual disturbance.  Respiratory: Negative for cough and  shortness of breath.   Cardiovascular: Negative for chest pain, leg swelling, syncope and near-syncope.  Gastrointestinal: Negative for abdominal pain, anorexia, blood in stool, diarrhea, hematochezia, melena, nausea and vomiting.  Genitourinary: Negative for dysuria and hematuria.  Skin: Negative for rash and wound.  Neurological: Positive for weakness. Negative for loss of consciousness and headaches.  Psychiatric/Behavioral: Negative.     Physical Exam Updated Vital Signs BP 106/75 (BP Location: Left Arm)   Pulse 78   Temp 98.5 F (36.9 C) (Oral)   Resp 18   Ht 4\' 11"  (1.499 m)   Wt 49.9 kg   SpO2 98%   BMI 22.22 kg/m   Physical Exam Vitals and nursing note reviewed.  Constitutional:      General: She is not in acute distress.    Appearance: Normal appearance. She is not toxic-appearing.     Comments: Chronically ill appearing.   HENT:     Head: Normocephalic and atraumatic.     Nose: Nose normal.     Mouth/Throat:     Mouth: Mucous membranes are moist.  Cardiovascular:     Rate and Rhythm: Normal rate and regular rhythm.     Heart sounds: Murmur heard.   Pulmonary:     Effort: Pulmonary effort is normal.     Breath sounds: Normal breath sounds. No wheezing.  Abdominal:     Palpations: Abdomen is soft.     Tenderness: There is no abdominal tenderness. There is no guarding or rebound.  Musculoskeletal:     Right lower leg: No edema.     Left lower leg: No edema.  Skin:    General: Skin is warm.     Findings: No rash.  Neurological:     General: No focal deficit present.     Mental Status: She is alert. Mental status is at baseline.     GCS: GCS eye subscore is 4. GCS verbal subscore is 5. GCS motor subscore is 6.  Psychiatric:        Attention and Perception: Attention normal.        Mood and Affect: Mood normal.        Behavior: Behavior normal.     ED Results / Procedures / Treatments   Labs (all labs ordered are listed, but only abnormal results are  displayed) Labs Reviewed  BASIC METABOLIC PANEL - Abnormal; Notable for the following components:      Result Value   Creatinine, Ser 4.57 (*)    GFR calc non Af Amer 10 (*)    GFR calc Af Amer 12 (*)    All other components within normal limits  CBC - Abnormal; Notable for the following components:   RBC 2.17 (*)    Hemoglobin 7.2 (*)    HCT 24.2 (*)    MCV 111.5 (*)    MCHC 29.8 (*)    RDW 22.6 (*)    Platelets 111 (*)    nRBC 0.3 (*)    All other components within normal limits  CBG MONITORING, ED - Abnormal; Notable for the following components:   Glucose-Capillary 105 (*)    All other components within normal limits  SARS CORONAVIRUS 2 BY RT PCR (HOSPITAL ORDER, [facility])  URINALYSIS, ROUTINE W REFLEX MICROSCOPIC  COMPREHENSIVE METABOLIC PANEL  CBC  IRON AND TIBC  TYPE AND SCREEN  PREPARE RBC (CROSSMATCH)    EKG EKG Interpretation  Date/Time:  Friday September 01 2019  11:54:49 EDT Ventricular Rate:  72 PR Interval:  158 QRS Duration: 70 QT Interval:  434 QTC Calculation: 475 R Axis:   42 Text Interpretation: Normal sinus rhythm Low voltage QRS Nonspecific T wave abnormality Confirmed by Lajean Saver [id number]) on 09/01/2019 2:48:57 PM   Radiology No results found.  Procedures Procedures (including critical care time)  Medications Ordered in ED Medications  acetaminophen (TYLENOL) tablet 650 mg (has no administration in time range)    Or  acetaminophen (TYLENOL) suppository 650 mg (has no administration in time range)  aspirin EC tablet 81 mg (has no administration in time range)  calcium acetate (PHOSLO) capsule 667 mg (has no administration in time range)  allopurinol (ZYLOPRIM) tablet 100 mg (has no administration in time range)  colchicine tablet 0.3 mg (has no administration in time range)  famotidine (PEPCID) tablet 20 mg (has no administration in time range)  gabapentin (NEURONTIN) capsule 100 mg (100 mg Oral Given 09/01/19  2312)  latanoprost (XALATAN) 0.005 % ophthalmic solution 1 drop (1 drop Left Eye Given 09/01/19 2314)  lidocaine (LIDODERM) 5 % 1 patch (has no administration in time range)  ondansetron (ZOFRAN-ODT) disintegrating tablet 4 mg (has no administration in time range)  predniSONE (DELTASONE) tablet 5 mg (has no administration in time range)  albuterol (PROVENTIL) (2.5 MG/3ML) 0.083% nebulizer solution 2.5 mg (has no administration in time range)  zolpidem (AMBIEN) tablet 5 mg (has no administration in time range)  tacrolimus (PROGRAF) capsule 3 mg (has no administration in time range)    And  tacrolimus (PROGRAF) capsule 2 mg (2 mg Oral Given 09/01/19 2313)  sodium chloride flush (NS) 0.9 % injection 3 mL (3 mLs Intravenous Given 09/01/19 1709)  sodium chloride 0.9 % bolus 250 mL (250 mLs Intravenous Bolus from Bag 09/01/19 1709)  0.9 %  sodium chloride infusion (Manually program via Guardrails IV Fluids) ( Intravenous Stopped 09/01/19 2148)  sodium chloride 0.9 % bolus 250 mL (250 mLs Intravenous New Bag/Given 09/01/19 2317)  oxyCODONE-acetaminophen (PERCOCET/ROXICET) 5-325 MG per tablet 2 tablet (2 tablets Oral Given 09/01/19 2312)    ED Course  I have reviewed the triage vital signs and the nursing notes.  Pertinent labs & imaging results that were available during my care of the patient were reviewed by me and considered in my medical decision making (see chart for details).  Clinical Course as of Sep 01 2343  Fri Sep 01, 2019  1541 Type and screen [AP]    Clinical Course User Index [AP] Kennyth Lose, MD   MDM Rules/Calculators/A&P                          Pt is a 54 yo F w complicated PMHx including CHF, ESRD on HD, Hypotension on midodrine and prior CVA who presents with concern for low hgb. Pt Hgb noted to be 6.3 this morning at outside facility. Pt endorses generalized fatigue wo focal weakness. Denies fever, URI symptoms, CP, SOB, dysuria. Denies rectal bleeding/melena. On arrival  patient afebrile, with SBP 80s however nad. Patient states baseline SBP 80s-90s. On exam pt with non-focal neuro exam. CN II-XII grossly intact. Lungs clear to auscultation. No overlying rash. Chart review demonstrates recurrent anemia in the setting of ESRD. Pt recently recently received 1 unit PRBC transfusion on 7/21.  Patient was admitted at that time for healthcare associated pneumonia and discharged on 7/22.  Chart review also demonstrates chronic hypotension with SBP's 80s-90s.  Patient on midodrine that  was recently increased.  Repeat CBC significant for hemoglobin 7.2.  As patient is symptomatic and has recurrent need for blood transfusions for symptomatic anemia will admit patient for blood transfusion in the setting of complex medical history.  Chart review demonstrates volume overload following previous transfusion. Based on complex medical history and concern for complications following transfusion believe patient requires admission for further management. 1 unit pRBC ordered for transfusion.   Final Clinical Impression(s) / ED Diagnoses Final diagnoses:  Symptomatic anemia    Rx / DC Orders ED Discharge Orders    None       Kennyth Lose, MD 09/01/19 [place], Wenda Overland, MD 09/03/19 [phone]

## 2019-09-01 NOTE — ED Triage Notes (Signed)
EMS stated, picked [facility] up from [facility]. She has a HGB 6.3, will need a transfusion.

## 2019-09-01 NOTE — ED Triage Notes (Signed)
Pt came by EMS due to abnormal hgb. , pt. With weakness.

## 2019-09-01 NOTE — ED Notes (Addendum)
Pt. C/o pain 10/10 and refusing to take Tylenol. Pt. Stated, she needs something stronger or she's going to go home. Pt. Reassured that this nurse will page the MD to address pt. Pain. Nurse will continue to monitor pt.

## 2019-09-01 NOTE — ED Notes (Signed)
Attempted report x1. 

## 2019-09-01 NOTE — H&P (Signed)
History and Physical    Tina Watkins EUM:[id number] DOB: 08-Dec-1965 DOA: 09/01/2019  PCP: [name], FNP  Patient coming from: [facility]  Chief Complaint: Anemia  HPI: Tina Watkins is a 54 y.o. 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 Pt was recently discharged for treatment of healthcare associated pneumonia with acute gout flareup.  Patient denies abdominal pain, active bleeding.  Patient also denies feelings of near syncope, shortness of breath or chest pain.  ED Course: In the ED, repeat CBC noted a hemoglobin of 7.2.  Given patient's increased weakness and 2 g drop in hemoglobin compared to last visit, PRBCs were ordered in the emergency department.  Hospitalist service consulted for consideration for medical admission  Review of Systems:  Review of Systems  Constitutional: Positive for malaise/fatigue. Negative for chills and fever.  HENT: Negative for ear discharge, nosebleeds and tinnitus.   Eyes: Negative for double vision, photophobia and pain.  Respiratory: Negative for hemoptysis, shortness of breath and wheezing.   Cardiovascular: Negative for chest pain, palpitations and orthopnea.  Gastrointestinal: Negative for abdominal pain, nausea and vomiting.  Genitourinary: Negative for frequency and urgency.  Musculoskeletal: Positive for myalgias. Negative for back pain and neck pain.  Neurological: Negative for tingling, tremors, seizures and loss of consciousness.  Psychiatric/Behavioral: Negative for hallucinations and memory loss. The patient is not nervous/anxious.     Past Medical History:  Diagnosis Date  . Bacteremia due to Gram-negative bacteria 05/23/2011  . Blind    right eye  . Blind right eye   . CHF (congestive heart failure) ([place])   . Chronic lower back pain   .  Complication of anesthesia    "woke up during OR; I have an extremely high tolerance" (12/11/2011) 1 procedure was graft; the other procedures were procedures that are typically done with sedation.  . DDD (degenerative disc disease), cervical   . Depression   . Dysrhythmia    "tachycardia" (12/11/2011) new onset afib 10/15/14 EKG  . E coli bacteremia 06/18/2011  . Elevated LDL cholesterol level 12/2018  . ESRD (end stage renal disease) ([place]) 06/12/2011  . Fibromyalgia   . Gastroparesis   . Gastroparesis   . GERD (gastroesophageal reflux disease)   . Glaucoma    right eye  . Gout   . H/O carpal tunnel syndrome   . Headache(784.0)    "not often anymore" (12/11/2011)  . Herpes genitalia 1994  . History of blood transfusion    "more than a few times" (12/11/2011)  . History of stomach ulcers   . Hypotension   . Iron deficiency anemia   . New onset a-fib ([place])    10/15/14 EKG  . Osteopenia   . Peripheral neuropathy 11/2018  . Pressure ulcer of sacral region, stage 1 07/2019  . Seizures ([place]) 1994   "post transplant; only have had that one" (12/11/2011)  . Spinal stenosis in cervical region   . Stroke [facility])     left basal ganglia lacunar infarct; Right frontal lobe lacunar infarct.  . Stroke [facility]) ~ 1999; 2001   "briefly lost my vision; lost my right eye" (12/11/2011)  . Vitamin D deficiency 12/2018    Past Surgical History:  Procedure Laterality Date  . ANTERIOR CERVICAL DECOMP/DISCECTOMY FUSION N/A 01/08/2015   Procedure: Anterior Cervical Three-Four/Four-Five Decompression/Diskectomy/Fusion;  Surgeon: Leeroy Cha,  MD;  Location: [place] NEURO ORS;  Service: Neurosurgery;  Laterality: N/A;  C3-4 C4-5 Anterior cervical decompression/diskectomy/fusion  . APPENDECTOMY  ~ 2004  . CATARACT EXTRACTION     right eye  . COLONOSCOPY    . ENUCLEATION  2001   "right"  . ESOPHAGOGASTRODUODENOSCOPY (EGD) WITH PROPOFOL N/A 04/21/2012   Procedure: ESOPHAGOGASTRODUODENOSCOPY (EGD) WITH PROPOFOL;   Surgeon: Milus Banister, MD;  Location: [facility] ENDOSCOPY;  Service: Endoscopy;  Laterality: N/A;  . 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 IR FLUORO GUIDE CV LINE RIGHT  07/11/2019  . IR RADIOLOGY PERIPHERAL GUIDED IV START  07/11/2019  . IR US GUIDE VASC ACCESS RIGHT  07/11/2019  . IR US GUIDE VASC ACCESS RIGHT  07/11/2019  . IR US GUIDE VASC ACCESS RIGHT  07/11/2019  . [place]; 1999; 2005   "right"  . MULTIPLE TOOTH EXTRACTIONS    . RIGHT HEART CATH N/A 03/23/2019   Procedure: RIGHT HEART CATH;  Surgeon: Jolaine Artist, MD;  Location: [facility] CV LAB;  Service: Cardiovascular;  Laterality: N/A;  . TONSILLECTOMY    . [place]?; 1994; 2005     reports that she has been smoking cigarettes. She has a 17.50 pack-year smoking history. She has never used smokeless tobacco. She reports current alcohol use. She reports current drug use. Drug: Marijuana.  Allergies  Allergen Reactions  . Levofloxacin Itching and Rash  . Tape Other (See Comments)    "Certain surgical tapes peel off my skin"    Family History  Problem Relation Age of Onset  . Glaucoma Mother   . Pancreatic cancer Father   . Multiple sclerosis Brother   . Hypertension Maternal Grandmother   . Breast cancer Neg Hx     Prior to Admission medications   Medication Sig Start Date End Date Taking? Authorizing Provider  allopurinol (ZYLOPRIM) 100 MG tablet Take 1 tablet (100 mg total) by mouth daily. 08/25/19 09/24/19  [name], Pratik D, DO  aspirin EC 81 MG tablet Take 81 mg by mouth daily.     [provider]  calcium acetate (PHOSLO) 667 MG capsule Take 1 capsule (667 mg total) by mouth 3 (three) times daily with meals. 08/24/19 09/23/19  [name], Pratik D, DO  colchicine 0.6 MG tablet Take 0.5 tablets (0.3 mg total) by mouth daily. 08/08/19   [name], MD  Darbepoetin Alfa (ARANESP, ALBUMIN FREE, IJ) Inject as directed every 14  (fourteen) days.  Patient not taking: Reported on 08/05/2019    [provider]  diphenhydrAMINE (BENADRYL) 25 mg capsule Take 1 capsule (25 mg total) by mouth every 8 (eight) hours as needed. Patient taking differently: Take 25 mg by mouth every 8 (eight) hours as needed for itching.  07/21/19   [name], FNP  famotidine (PEPCID) 20 MG tablet Take 1 tablet (20 mg total) by mouth 2 (two) times daily. 06/13/19   [name], FNP  fluticasone (FLONASE) 50 MCG/ACT nasal spray Place 1 spray into both nostrils daily as needed for allergies or rhinitis.    [provider]  gabapentin (NEURONTIN) 100 MG capsule Take 100 mg by mouth 2 (two) times daily.  09/27/18   [provider]  latanoprost (XALATAN) 0.005 % ophthalmic solution Place 1 drop into the left eye at bedtime. 03/30/17   [provider]  lidocaine (LIDODERM) 5 % Place 1 patch  onto the skin daily as needed (for pain- remove old patch first).  10/28/18   [provider]  midodrine (PROAMATINE) 5 MG tablet Take 2 tablets (10 mg total) by mouth 2 (two) times daily with a meal. Patient taking differently: Take 5 mg by mouth 2 (two) times daily with a meal.  07/17/19   Domenic Polite, MD  nystatin (MYCOSTATIN) 100000 UNIT/ML suspension Use as directed 5 mLs in the mouth or throat daily as needed (FOR THRUSH).     [provider]  ondansetron (ZOFRAN-ODT) 4 MG disintegrating tablet Take 1 tablet (4 mg total) by mouth every 8 (eight) hours as needed for nausea or vomiting. 06/13/19   [name], FNP  predniSONE (DELTASONE) 5 MG tablet Take 1 tablet (5 mg total) by mouth daily with breakfast. 08/25/19 09/24/19  [name], Pratik D, DO  PROVENTIL HFA 108 (90 Base) MCG/ACT inhaler Inhale 2 puffs into the lungs 4 (four) times daily as needed for shortness of breath.  08/04/17   [provider]  tacrolimus (PROGRAF) 1 MG capsule Take 2-3 mg by mouth See admin instructions. Take 3 mg by mouth  in the morning and 2 mg at bedtime 06/22/11   [name], MD  Vitamin D, Ergocalciferol, (DRISDOL) 1.25 MG (50000 UNIT) CAPS capsule Take 50,000 Units by mouth every Sunday.     [provider]  zolpidem (AMBIEN) 10 MG tablet Take 1 tablet (10 mg total) by mouth at bedtime as needed for sleep. 08/24/19   [name], DO    Physical Exam: Vitals:   09/01/19 1324 09/01/19 1609 09/01/19 1645 09/01/19 1730  BP: (!) 86/49  (!) 84/46 (!) 86/50  Pulse: 76  82 81  Resp: 15  21 23   Temp: 98.3 F (36.8 C)     TempSrc: Oral     SpO2: 99%  100% 99%  Weight:  49.9 kg    Height:  4\' 11"  (1.499 m)      Constitutional: NAD, calm, comfortable Vitals:   09/01/19 1324 09/01/19 1609 09/01/19 1645 09/01/19 1730  BP: (!) 86/49  (!) 84/46 (!) 86/50  Pulse: 76  82 81  Resp: 15  21 23   Temp: 98.3 F (36.8 C)     TempSrc: Oral     SpO2: 99%  100% 99%  Weight:  49.9 kg    Height:  4\' 11"  (1.499 m)     Eyes: PERRL, lids and conjunctivae normal ENMT: Mucous membranes are moist. Posterior pharynx clear of any exudate or lesions.Normal dentition.  Neck: normal, supple, no masses, no thyromegaly Respiratory: clear to auscultation bilaterally, no wheezing, no crackles. Normal respiratory effort. No accessory muscle use.  Cardiovascular: Regular rate and rhythm, s1, s2.  Abdomen: no tenderness, no masses palpated. No hepatosplenomegaly. Bowel sounds positive.  Musculoskeletal: no clubbing / cyanosis. No joint deformity upper and lower extremities. Good ROM, no contractures. Normal muscle tone.  Skin: no rashes, lesions, ulcers. No induration Neurologic: CN 2-12 grossly intact. Sensation intact,  Strength 5/5 in all 4.  Psychiatric: Normal judgment and insight. Alert and oriented x 3. Normal mood.    Labs on Admission: I have personally reviewed following labs and imaging studies  CBC: Recent Labs  Lab 09/01/19 1203  WBC 7.1  HGB 7.2*  HCT 24.2*  MCV 111.5*  PLT 111*   Basic  Metabolic Panel: Recent Labs  Lab 09/01/19 1203  NA 137  K 3.5  CL 99  CO2 25  GLUCOSE 97  BUN 18  CREATININE 4.57*  CALCIUM 8.9   GFR: Estimated Creatinine Clearance: 9.8 mL/min (A) (by C-G formula based on SCr of 4.57 mg/dL (H)). Liver Function Tests: No results for input(s): AST, ALT, ALKPHOS, BILITOT, PROT, ALBUMIN in the last 168 hours. No results for input(s): LIPASE, AMYLASE in the last 168 hours. No results for input(s): AMMONIA in the last 168 hours. Coagulation Profile: No results for input(s): INR, PROTIME in the last 168 hours. Cardiac Enzymes: No results for input(s): CKTOTAL, CKMB, CKMBINDEX, TROPONINI in the last 168 hours. BNP (last 3 results) No results for input(s): PROBNP in the last 8760 hours. HbA1C: No results for input(s): HGBA1C in the last 72 hours. CBG: Recent Labs  Lab 09/01/19 1702  GLUCAP 105*   Lipid Profile: No results for input(s): CHOL, HDL, LDLCALC, TRIG, CHOLHDL, LDLDIRECT in the last 72 hours. Thyroid Function Tests: No results for input(s): TSH, T4TOTAL, FREET4, T3FREE, THYROIDAB in the last 72 hours. Anemia Panel: No results for input(s): VITAMINB12, FOLATE, FERRITIN, TIBC, IRON, RETICCTPCT in the last 72 hours. Urine analysis:    Component Value Date/Time   COLORURINE AMBER (A) 08/05/2019 1835   APPEARANCEUR HAZY (A) 08/05/2019 1835   LABSPEC 1.021 08/05/2019 1835   PHURINE 5.0 08/05/2019 1835   GLUCOSEU NEGATIVE 08/05/2019 1835   HGBUR SMALL (A) 08/05/2019 1835   BILIRUBINUR NEGATIVE 08/05/2019 1835   BILIRUBINUR Negative 01/04/2019 1030   KETONESUR NEGATIVE 08/05/2019 1835   PROTEINUR 30 (A) 08/05/2019 1835   UROBILINOGEN 0.2 01/04/2019 1030   UROBILINOGEN 0.2 10/15/2014 0946   NITRITE NEGATIVE 08/05/2019 1835   LEUKOCYTESUR NEGATIVE 08/05/2019 1835   Sepsis Labs: !!!!!!!!!!!!!!!!!!!!!!!!!!!!!!!!!!!!!!!!!!!! @LABRCNTIP (procalcitonin:4,lacticidven:4) ) Recent Results (from the past 240 hour(s))  SARS Coronavirus 2 by  RT PCR (hospital order, performed in [place] hospital lab) Nasopharyngeal Nasopharyngeal Swab     Status: None   Collection Time: 08/24/19 10:15 AM   Specimen: Nasopharyngeal Swab  Result Value Ref Range Status   SARS Coronavirus 2 NEGATIVE NEGATIVE Final    Comment: (NOTE) SARS-CoV-2 target nucleic acids are NOT DETECTED.  The SARS-CoV-2 RNA is generally detectable in upper and lower respiratory specimens during the acute phase of infection. The lowest concentration of SARS-CoV-2 viral copies this assay can detect is 250 copies / mL. A negative result does not preclude SARS-CoV-2 infection and should not be used as the sole basis for treatment or other patient management decisions.  A negative result may occur with improper specimen collection / handling, submission of specimen other than nasopharyngeal swab, presence of viral mutation(s) within the areas targeted by this assay, and inadequate number of viral copies (<250 copies / mL). A negative result must be combined with clinical observations, patient history, and epidemiological information.  Fact Sheet for Patients:   StrictlyIdeas.no  Fact Sheet for Healthcare Providers: BankingDealers.co.za  This test is not yet approved or  cleared by the [place] FDA and has been authorized for detection and/or diagnosis of SARS-CoV-2 by FDA under an Emergency Use Authorization (EUA).  This EUA will remain in effect (meaning this test can be used) for the duration of the COVID-19 declaration under Section 564(b)(1) of the Act, 21 U.S.C. section 360bbb-3(b)(1), unless the authorization is terminated or revoked sooner.  Performed at [facility], [place] [address] [place], [place] [address]      Radiological Exams on Admission: No results found.  EKG: Independently reviewed. Sinus  Assessment/Plan Principal Problem:   Symptomatic anemia Active Problems:   ESRD (end  stage renal disease) ([place])   History of kidney transplant   Anemia in chronic kidney disease   DDD (degenerative disc disease), cervical   1. Symptomatic anemia 1. Presented with Hgb in the 6 range at facility, repeat hgb ofo 7.2 in ED 2. PRBC;s ordered by EDP 3. Will observe overnight, repeat CBC in AM 4. No findings of acute blood loss 5. We will check iron study 2. ESRD 1. On TTS HD 2. Discussed with nephrology.  Given obs status, tentative plan for observation overnight and discharged to outpatient dialysis if patient stable in the morning 3. Hx failed renal transplant 1. Chart reviewed 2. Despite failed transplant, pt is continued on immunosuppressant, will continue 4. Anemia of chronic disease 1. Per above, PRBC being transfused 2. Repeat CBC in AM 5. DDD 1. Seems to be stable at this time  DVT prophylaxis: SCD's  Code Status: Full Family Communication: Pt in room  Disposition Plan: [place] called: Nephrology Admission status: Observation as would likely require less than 2 midnight stay for observation overnight   Marylu Lund MD [facility] Pager On Amion  If 7PM-7AM, please contact night-coverage  09/01/2019, 5:41 PM

## 2019-09-01 NOTE — ED Notes (Signed)
Report attempted x2

## 2019-09-02 DIAGNOSIS — Z881 Allergy status to other antibiotic agents status: Secondary | ICD-10-CM | POA: Diagnosis not present

## 2019-09-02 DIAGNOSIS — D61818 Other pancytopenia: Secondary | ICD-10-CM | POA: Diagnosis present

## 2019-09-02 DIAGNOSIS — H5461 Unqualified visual loss, right eye, normal vision left eye: Secondary | ICD-10-CM | POA: Diagnosis present

## 2019-09-02 DIAGNOSIS — D631 Anemia in chronic kidney disease: Secondary | ICD-10-CM

## 2019-09-02 DIAGNOSIS — I9589 Other hypotension: Secondary | ICD-10-CM | POA: Diagnosis present

## 2019-09-02 DIAGNOSIS — Z8673 Personal history of transient ischemic attack (TIA), and cerebral infarction without residual deficits: Secondary | ICD-10-CM | POA: Diagnosis not present

## 2019-09-02 DIAGNOSIS — N185 Chronic kidney disease, stage 5: Secondary | ICD-10-CM

## 2019-09-02 DIAGNOSIS — Y830 Surgical operation with transplant of whole organ as the cause of abnormal reaction of the patient, or of later complication, without mention of misadventure at the time of the procedure: Secondary | ICD-10-CM | POA: Diagnosis present

## 2019-09-02 DIAGNOSIS — M5030 Other cervical disc degeneration, unspecified cervical region: Secondary | ICD-10-CM | POA: Diagnosis not present

## 2019-09-02 DIAGNOSIS — Z8711 Personal history of peptic ulcer disease: Secondary | ICD-10-CM | POA: Diagnosis not present

## 2019-09-02 DIAGNOSIS — D696 Thrombocytopenia, unspecified: Secondary | ICD-10-CM | POA: Diagnosis not present

## 2019-09-02 DIAGNOSIS — Z981 Arthrodesis status: Secondary | ICD-10-CM | POA: Diagnosis not present

## 2019-09-02 DIAGNOSIS — M5090 Cervical disc disorder, unspecified, unspecified cervical region: Secondary | ICD-10-CM | POA: Diagnosis not present

## 2019-09-02 DIAGNOSIS — Z820 Family history of epilepsy and other diseases of the nervous system: Secondary | ICD-10-CM | POA: Diagnosis not present

## 2019-09-02 DIAGNOSIS — Z20822 Contact with and (suspected) exposure to covid-19: Secondary | ICD-10-CM | POA: Diagnosis present

## 2019-09-02 DIAGNOSIS — I959 Hypotension, unspecified: Secondary | ICD-10-CM | POA: Diagnosis not present

## 2019-09-02 DIAGNOSIS — Z888 Allergy status to other drugs, medicaments and biological substances status: Secondary | ICD-10-CM | POA: Diagnosis not present

## 2019-09-02 DIAGNOSIS — D649 Anemia, unspecified: Secondary | ICD-10-CM | POA: Diagnosis not present

## 2019-09-02 DIAGNOSIS — Z940 Kidney transplant status: Secondary | ICD-10-CM | POA: Diagnosis not present

## 2019-09-02 DIAGNOSIS — N186 End stage renal disease: Secondary | ICD-10-CM | POA: Diagnosis not present

## 2019-09-02 DIAGNOSIS — R5381 Other malaise: Secondary | ICD-10-CM | POA: Diagnosis not present

## 2019-09-02 DIAGNOSIS — F419 Anxiety disorder, unspecified: Secondary | ICD-10-CM | POA: Diagnosis not present

## 2019-09-02 DIAGNOSIS — Z83511 Family history of glaucoma: Secondary | ICD-10-CM | POA: Diagnosis not present

## 2019-09-02 DIAGNOSIS — I5032 Chronic diastolic (congestive) heart failure: Secondary | ICD-10-CM | POA: Diagnosis not present

## 2019-09-02 DIAGNOSIS — R531 Weakness: Secondary | ICD-10-CM | POA: Diagnosis not present

## 2019-09-02 DIAGNOSIS — H409 Unspecified glaucoma: Secondary | ICD-10-CM | POA: Diagnosis present

## 2019-09-02 DIAGNOSIS — K219 Gastro-esophageal reflux disease without esophagitis: Secondary | ICD-10-CM | POA: Diagnosis not present

## 2019-09-02 DIAGNOSIS — F329 Major depressive disorder, single episode, unspecified: Secondary | ICD-10-CM | POA: Diagnosis present

## 2019-09-02 DIAGNOSIS — M2550 Pain in unspecified joint: Secondary | ICD-10-CM | POA: Diagnosis not present

## 2019-09-02 DIAGNOSIS — T8612 Kidney transplant failure: Secondary | ICD-10-CM | POA: Diagnosis not present

## 2019-09-02 DIAGNOSIS — F17210 Nicotine dependence, cigarettes, uncomplicated: Secondary | ICD-10-CM | POA: Diagnosis present

## 2019-09-02 DIAGNOSIS — M109 Gout, unspecified: Secondary | ICD-10-CM | POA: Diagnosis not present

## 2019-09-02 DIAGNOSIS — Z8616 Personal history of COVID-19: Secondary | ICD-10-CM | POA: Diagnosis not present

## 2019-09-02 DIAGNOSIS — Z7401 Bed confinement status: Secondary | ICD-10-CM | POA: Diagnosis not present

## 2019-09-02 DIAGNOSIS — G8929 Other chronic pain: Secondary | ICD-10-CM | POA: Diagnosis present

## 2019-09-02 DIAGNOSIS — Z992 Dependence on renal dialysis: Secondary | ICD-10-CM | POA: Diagnosis not present

## 2019-09-02 DIAGNOSIS — M797 Fibromyalgia: Secondary | ICD-10-CM | POA: Diagnosis present

## 2019-09-02 DIAGNOSIS — I4891 Unspecified atrial fibrillation: Secondary | ICD-10-CM | POA: Diagnosis present

## 2019-09-02 DIAGNOSIS — Z8249 Family history of ischemic heart disease and other diseases of the circulatory system: Secondary | ICD-10-CM | POA: Diagnosis not present

## 2019-09-02 LAB — IRON AND TIBC
Iron: 146 ug/dL (ref 28–170)
Saturation Ratios: 80 % — ABNORMAL HIGH (ref 10.4–31.8)
TIBC: 182 ug/dL — ABNORMAL LOW (ref 250–450)
UIBC: 36 ug/dL

## 2019-09-02 LAB — COMPREHENSIVE METABOLIC PANEL
ALT: 27 U/L (ref 0–44)
AST: 34 U/L (ref 15–41)
Albumin: 3 g/dL — ABNORMAL LOW (ref 3.5–5.0)
Alkaline Phosphatase: 125 U/L (ref 38–126)
Anion gap: 11 (ref 5–15)
BUN: 25 mg/dL — ABNORMAL HIGH (ref 6–20)
CO2: 27 mmol/L (ref 22–32)
Calcium: 9 mg/dL (ref 8.9–10.3)
Chloride: 101 mmol/L (ref 98–111)
Creatinine, Ser: 5.44 mg/dL — ABNORMAL HIGH (ref 0.44–1.00)
GFR calc Af Amer: 10 mL/min — ABNORMAL LOW (ref >60)
GFR calc non Af Amer: 8 mL/min — ABNORMAL LOW (ref >60)
Glucose, Bld: 90 mg/dL (ref 70–99)
Potassium: 3.9 mmol/L (ref 3.5–5.1)
Sodium: 139 mmol/L (ref 135–145)
Total Bilirubin: 1.5 mg/dL — ABNORMAL HIGH (ref 0.3–1.2)
Total Protein: 5.4 g/dL — ABNORMAL LOW (ref 6.5–8.1)

## 2019-09-02 LAB — CBC
HCT: 27.6 % — ABNORMAL LOW (ref 36.0–46.0)
Hemoglobin: 8.6 g/dL — ABNORMAL LOW (ref 12.0–15.0)
MCH: 32 pg (ref 26.0–34.0)
MCHC: 31.2 g/dL (ref 30.0–36.0)
MCV: 102.6 fL — ABNORMAL HIGH (ref 80.0–100.0)
Platelets: 113 10*3/uL — ABNORMAL LOW (ref 150–400)
RBC: 2.69 MIL/uL — ABNORMAL LOW (ref 3.87–5.11)
RDW: 25.3 % — ABNORMAL HIGH (ref 11.5–15.5)
WBC: 5.9 10*3/uL (ref 4.0–10.5)
nRBC: 0.3 % — ABNORMAL HIGH (ref 0.0–0.2)

## 2019-09-02 MED ORDER — HEPARIN SODIUM (PORCINE) 1000 UNIT/ML IJ SOLN
INTRAMUSCULAR | Status: AC
  Filled 2019-09-02: qty 4

## 2019-09-02 MED ORDER — SODIUM CHLORIDE 0.9 % IV SOLN: 100.0000 mL | INTRAVENOUS | Status: DC | PRN

## 2019-09-02 MED ORDER — CHLORHEXIDINE GLUCONATE CLOTH 2 % EX PADS
6.0000 | MEDICATED_PAD | Freq: Every day | CUTANEOUS | Status: DC
  Administered 2019-09-02 – 2019-09-03 (×2): 6 via TOPICAL

## 2019-09-02 MED ORDER — ALTEPLASE 2 MG IJ SOLR: 2.0000 mg | Freq: Once | INTRAMUSCULAR | Status: DC | PRN

## 2019-09-02 MED ORDER — OXYCODONE-ACETAMINOPHEN 5-325 MG PO TABS
2.0000 | ORAL_TABLET | ORAL | Status: DC | PRN
  Administered 2019-09-02 – 2019-09-03 (×4): 2 via ORAL
  Filled 2019-09-02 (×4): qty 2

## 2019-09-02 MED ORDER — HEPARIN SODIUM (PORCINE) 1000 UNIT/ML DIALYSIS: 1000.0000 [IU] | INTRAMUSCULAR | Status: DC | PRN

## 2019-09-02 MED ORDER — LIDOCAINE HCL (PF) 1 % IJ SOLN: 5.0000 mL | INTRAMUSCULAR | Status: DC | PRN

## 2019-09-02 MED ORDER — HEPARIN SODIUM (PORCINE) 1000 UNIT/ML DIALYSIS: 20.0000 [IU]/kg | INTRAMUSCULAR | Status: DC | PRN

## 2019-09-02 MED ORDER — LIDOCAINE-PRILOCAINE 2.5-2.5 % EX CREA
1.0000 "application " | TOPICAL_CREAM | CUTANEOUS | Status: DC | PRN
  Filled 2019-09-02: qty 5

## 2019-09-02 MED ORDER — OXYCODONE-ACETAMINOPHEN 5-325 MG PO TABS
2.0000 | ORAL_TABLET | Freq: Four times a day (QID) | ORAL | Status: DC | PRN
  Administered 2019-09-02: 2 via ORAL
  Filled 2019-09-02: qty 2

## 2019-09-02 MED ORDER — CHLORHEXIDINE GLUCONATE CLOTH 2 % EX PADS
6.0000 | MEDICATED_PAD | Freq: Every day | CUTANEOUS | Status: DC
  Administered 2019-09-02: 6 via TOPICAL

## 2019-09-02 MED ORDER — PENTAFLUOROPROP-TETRAFLUOROETH EX AERO: 1.0000 "application " | INHALATION_SPRAY | CUTANEOUS | Status: DC | PRN

## 2019-09-02 NOTE — Social Work (Signed)
CSW was alerted that patient was medically stable for discharge today and SNF would need to arrange for HD today. CSW followed up with SNF and was notified that they were unable to take patient today and the hospital would need to provide HD. CSW followed up with RN on status on patient's discharge.  TOC team will continue to assist with discharge planning needs.

## 2019-09-02 NOTE — Progress Notes (Signed)
PROGRESS NOTE    Tina Watkins  WLS:[id number] DOB: [birth year] DOA: 09/01/2019 PCP: [name], FNP    Brief Narrative:  54 y.o. 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 Pt was recently discharged for treatment of healthcare associated pneumonia with acute gout flareup.  Patient denies abdominal pain, active bleeding.  Patient also denies feelings of near syncope, shortness of breath or chest pain.  ED Course: In the ED, repeat CBC noted a hemoglobin of 7.2.  Given patient's increased weakness and 2 g drop in hemoglobin compared to last visit, PRBCs were ordered in the emergency department.  Hospitalist service consulted for consideration for medical admission  Assessment & Plan:   Principal Problem:   Symptomatic anemia Active Problems:   ESRD (end stage renal disease) ([place])   History of kidney transplant   Anemia in chronic kidney disease   DDD (degenerative disc disease), cervical   Anemia  1. Symptomatic anemia 1. Presented with Hgb in the 6 range at facility, repeat hgb ofo 7.2 in ED 2. 1 unit PRBC;s ordered by EDP 3. Posttransfusion hemoglobin responded appropriately, this morning hemoglobin 8.6. 4. No findings of acute blood loss 5. We will check stool for blood 2. ESRD 1. On TTS HD 2. Unable to arrange outpatient dialysis today, discussed with nephrology.  Plan to dialyze while in hospital today. 3. Hx failed renal transplant 1. Chart reviewed 2. Despite failed transplant, pt is continued on immunosuppressant, will continue while in hospital 4. Anemia of chronic disease 1. Per above, PRBC being transfused 2. Recheck CBC in the morning 5. DDD 1. Seems to be stable at this time 6. Chronic hypotension 1. Chart reviewed. SBP in the 80's noted from prior admit 2. BP seems to be near baseline this  AM 3. Continue midodrine 4. Cont to monitor. Pt asymptomatic  DVT prophylaxis: SCD's Code Status: [place] Family Communication: Patient in room, family not at bedside  Status is: Inpatient  Remains inpatient appropriate because:Unsafe d/c plan   Dispo: The patient is from: SNF              Anticipated d/c is to: SNF              Anticipated d/c date is: 1 day              Patient currently is not medically stable to d/c.       Consultants:   Nephrology  Procedures:     Antimicrobials: Anti-infectives (From admission, onward)   None       Subjective: Complaining of continued chronic generalized pain, requesting current narcotic frequency to be increased to every 4 hours  Objective: Vitals:   09/02/19 0511 09/02/19 0700 09/02/19 0824 09/02/19 1335  BP: (!) 81/56 103/65 (!) 100/64 (!) 95/59  Pulse: 80 77  77  Resp: 16 16  18   Temp: 98.3 F (36.8 C) 98.1 F (36.7 C)  98.5 F (36.9 C)  TempSrc: Oral Oral  Oral  SpO2: 95% 99%  100%  Weight: 55.6 kg     Height:       No intake or output data in the 24 hours ending 09/02/19 1511 Filed Weights   09/01/19 1609 09/02/19 0511  Weight: 49.9 kg 55.6 kg    Examination: General exam: Awake, laying in bed, in nad Respiratory system: Normal respiratory  effort, no wheezing Cardiovascular system: regular rate, s1, s2 Gastrointestinal system: Soft, nondistended, positive BS Central nervous system: CN2-12 grossly intact, strength intact Extremities: Perfused, no clubbing Skin: Normal skin turgor, no notable skin lesions seen Psychiatry: Mood normal // no visual hallucinations   Data Reviewed: I have personally reviewed following labs and imaging studies  CBC: Recent Labs  Lab 09/01/19 1203 09/02/19 0224  WBC 7.1 5.9  HGB 7.2* 8.6*  HCT 24.2* 27.6*  MCV 111.5* 102.6*  PLT 111* 528*   Basic Metabolic Panel: Recent Labs  Lab 09/01/19 1203 09/02/19 0224  NA 137 139  K 3.5 3.9  CL 99 101  CO2 25 27   GLUCOSE 97 90  BUN 18 25*  CREATININE 4.57* 5.44*  CALCIUM 8.9 9.0   GFR: Estimated Creatinine Clearance: 9.2 mL/min (A) (by C-G formula based on SCr of 5.44 mg/dL (H)). Liver Function Tests: Recent Labs  Lab 09/02/19 0224  AST 34  ALT 27  ALKPHOS 125  BILITOT 1.5*  PROT 5.4*  ALBUMIN 3.0*   No results for input(s): LIPASE, AMYLASE in the last 168 hours. No results for input(s): AMMONIA in the last 168 hours. Coagulation Profile: No results for input(s): INR, PROTIME in the last 168 hours. Cardiac Enzymes: No results for input(s): CKTOTAL, CKMB, CKMBINDEX, TROPONINI in the last 168 hours. BNP (last 3 results) No results for input(s): PROBNP in the last 8760 hours. HbA1C: No results for input(s): HGBA1C in the last 72 hours. CBG: Recent Labs  Lab 09/01/19 1702  GLUCAP 105*   Lipid Profile: No results for input(s): CHOL, HDL, LDLCALC, TRIG, CHOLHDL, LDLDIRECT in the last 72 hours. Thyroid Function Tests: No results for input(s): TSH, T4TOTAL, FREET4, T3FREE, THYROIDAB in the last 72 hours. Anemia Panel: Recent Labs    09/02/19 0224  TIBC 182*  IRON 146   Sepsis Labs: No results for input(s): PROCALCITON, LATICACIDVEN in the last 168 hours.  Recent Results (from the past 240 hour(s))  SARS Coronavirus 2 by RT PCR (hospital order, performed in [facility] hospital lab) Nasopharyngeal Nasopharyngeal Swab     Status: None   Collection Time: 08/24/19 10:15 AM   Specimen: Nasopharyngeal Swab  Result Value Ref Range Status   SARS Coronavirus 2 NEGATIVE NEGATIVE Final    Comment: (NOTE) SARS-CoV-2 target nucleic acids are NOT DETECTED.  The SARS-CoV-2 RNA is generally detectable in upper and lower respiratory specimens during the acute phase of infection. The lowest concentration of SARS-CoV-2 viral copies this assay can detect is 250 copies / mL. A negative result does not preclude SARS-CoV-2 infection and should not be used as the sole basis for treatment or  other patient management decisions.  A negative result may occur with improper specimen collection / handling, submission of specimen other than nasopharyngeal swab, presence of viral mutation(s) within the areas targeted by this assay, and inadequate number of viral copies (<250 copies / mL). A negative result must be combined with clinical observations, patient history, and epidemiological information.  Fact Sheet for Patients:   StrictlyIdeas.no  Fact Sheet for Healthcare Providers: BankingDealers.co.za  This test is not yet approved or  cleared by the [place] FDA and has been authorized for detection and/or diagnosis of SARS-CoV-2 by FDA under an Emergency Use Authorization (EUA).  This EUA will remain in effect (meaning this test can be used) for the duration of the COVID-19 declaration under Section 564(b)(1) of the Act, 21 U.S.C. section 360bbb-3(b)(1), unless the authorization is terminated or revoked sooner.  Performed at [facility], [place] [address], [place], [place] [address]   SARS Coronavirus 2 by RT PCR (hospital order, performed in [facility] hospital lab) Nasopharyngeal Nasopharyngeal Swab     Status: None   Collection Time: 09/01/19  5:51 PM   Specimen: Nasopharyngeal Swab  Result Value Ref Range Status   SARS Coronavirus 2 NEGATIVE NEGATIVE Final    Comment: (NOTE) SARS-CoV-2 target nucleic acids are NOT DETECTED.  The SARS-CoV-2 RNA is generally detectable in upper and lower respiratory specimens during the acute phase of infection. The lowest concentration of SARS-CoV-2 viral copies this assay can detect is 250 copies / mL. A negative result does not preclude SARS-CoV-2 infection and should not be used as the sole basis for treatment or other patient management decisions.  A negative result may occur with improper specimen collection / handling, submission of specimen other than nasopharyngeal swab,  presence of viral mutation(s) within the areas targeted by this assay, and inadequate number of viral copies (<250 copies / mL). A negative result must be combined with clinical observations, patient history, and epidemiological information.  Fact Sheet for Patients:   StrictlyIdeas.no  Fact Sheet for Healthcare Providers: BankingDealers.co.za  This test is not yet approved or  cleared by the [place] FDA and has been authorized for detection and/or diagnosis of SARS-CoV-2 by FDA under an Emergency Use Authorization (EUA).  This EUA will remain in effect (meaning this test can be used) for the duration of the COVID-19 declaration under Section 564(b)(1) of the Act, 21 U.S.C. section 360bbb-3(b)(1), unless the authorization is terminated or revoked sooner.  Performed at [facility], [place] [address], [place], [place] [address]      Radiology Studies: No results found.  Scheduled Meds: . allopurinol  100 mg Oral Daily  . aspirin EC  81 mg Oral Daily  . calcium acetate  667 mg Oral TID WC  . Chlorhexidine Gluconate Cloth  6 each Topical Q0600  . colchicine  0.3 mg Oral Daily  . famotidine  20 mg Oral Daily  . gabapentin  100 mg Oral BID  . latanoprost  1 drop Left Eye QHS  . predniSONE  5 mg Oral Q breakfast  . tacrolimus  3 mg Oral Daily   And  . tacrolimus  2 mg Oral QHS   Continuous Infusions:   LOS: 0 days   Marylu Lund, MD [facility] Pager On Amion  If 7PM-7AM, please contact night-coverage 09/02/2019, 3:11 PM

## 2019-09-02 NOTE — Progress Notes (Signed)
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 Hgb in the ED was 7.2 .  She was symptomatic with weakness.  Last outpatient hgb at her dialysis unit was 6.9 7/29 up from 6.3 7/27 with last Mircera dose 150 7/27.  She was transfused 1 unit PRBC 7/30 and hgb is up to 8.6 today.  Also had a transfusion of 1 unit PRBC earlier in the month during prior hospitalization. Last FOBT 4/6 negative tsat has been high.    SNF unable to provide transportation today to dialysis as they require 24 hr notice. Plan HD here today with d/c tomorrow back to [facility].  HD Orders TTS [place] 4 hr EDW 50 2K 2 Ca 400/A1.5 Keep SBP >85 heparin 2000 Mircera 150 q 2 - last 7/27 calcitriol 0.25 - no Fe  Amalia Hailey, [facility] [phone]

## 2019-09-03 DIAGNOSIS — N185 Chronic kidney disease, stage 5: Secondary | ICD-10-CM | POA: Diagnosis not present

## 2019-09-03 DIAGNOSIS — K219 Gastro-esophageal reflux disease without esophagitis: Secondary | ICD-10-CM | POA: Diagnosis not present

## 2019-09-03 DIAGNOSIS — Z7401 Bed confinement status: Secondary | ICD-10-CM | POA: Diagnosis not present

## 2019-09-03 DIAGNOSIS — F419 Anxiety disorder, unspecified: Secondary | ICD-10-CM | POA: Diagnosis not present

## 2019-09-03 DIAGNOSIS — Z992 Dependence on renal dialysis: Secondary | ICD-10-CM | POA: Diagnosis not present

## 2019-09-03 DIAGNOSIS — I5032 Chronic diastolic (congestive) heart failure: Secondary | ICD-10-CM | POA: Diagnosis not present

## 2019-09-03 DIAGNOSIS — M2550 Pain in unspecified joint: Secondary | ICD-10-CM | POA: Diagnosis not present

## 2019-09-03 DIAGNOSIS — Z8616 Personal history of COVID-19: Secondary | ICD-10-CM | POA: Diagnosis not present

## 2019-09-03 DIAGNOSIS — Z940 Kidney transplant status: Secondary | ICD-10-CM | POA: Diagnosis not present

## 2019-09-03 DIAGNOSIS — Z8673 Personal history of transient ischemic attack (TIA), and cerebral infarction without residual deficits: Secondary | ICD-10-CM | POA: Diagnosis not present

## 2019-09-03 DIAGNOSIS — I959 Hypotension, unspecified: Secondary | ICD-10-CM | POA: Diagnosis not present

## 2019-09-03 DIAGNOSIS — R531 Weakness: Secondary | ICD-10-CM | POA: Diagnosis not present

## 2019-09-03 DIAGNOSIS — D649 Anemia, unspecified: Secondary | ICD-10-CM | POA: Diagnosis not present

## 2019-09-03 DIAGNOSIS — M5030 Other cervical disc degeneration, unspecified cervical region: Secondary | ICD-10-CM | POA: Diagnosis not present

## 2019-09-03 DIAGNOSIS — D631 Anemia in chronic kidney disease: Secondary | ICD-10-CM | POA: Diagnosis not present

## 2019-09-03 DIAGNOSIS — R5381 Other malaise: Secondary | ICD-10-CM | POA: Diagnosis not present

## 2019-09-03 DIAGNOSIS — M109 Gout, unspecified: Secondary | ICD-10-CM | POA: Diagnosis not present

## 2019-09-03 DIAGNOSIS — M5090 Cervical disc disorder, unspecified, unspecified cervical region: Secondary | ICD-10-CM | POA: Diagnosis not present

## 2019-09-03 DIAGNOSIS — N186 End stage renal disease: Secondary | ICD-10-CM | POA: Diagnosis not present

## 2019-09-03 DIAGNOSIS — D696 Thrombocytopenia, unspecified: Secondary | ICD-10-CM | POA: Diagnosis not present

## 2019-09-03 LAB — CBC
HCT: 28.2 % — ABNORMAL LOW (ref 36.0–46.0)
Hemoglobin: 8.7 g/dL — ABNORMAL LOW (ref 12.0–15.0)
MCH: 31 pg (ref 26.0–34.0)
MCHC: 30.9 g/dL (ref 30.0–36.0)
MCV: 100.4 fL — ABNORMAL HIGH (ref 80.0–100.0)
Platelets: 97 10*3/uL — ABNORMAL LOW (ref 150–400)
RBC: 2.81 MIL/uL — ABNORMAL LOW (ref 3.87–5.11)
RDW: 24.5 % — ABNORMAL HIGH (ref 11.5–15.5)
WBC: 5.8 10*3/uL (ref 4.0–10.5)
nRBC: 0.5 % — ABNORMAL HIGH (ref 0.0–0.2)

## 2019-09-03 MED ORDER — MIDODRINE HCL 5 MG PO TABS
10.0000 mg | ORAL_TABLET | Freq: Once | ORAL | Status: AC
  Administered 2019-09-03: 10 mg via ORAL
  Filled 2019-09-03: qty 2

## 2019-09-03 MED ORDER — HYDROMORPHONE HCL 2 MG PO TABS: 1.0000 mg | ORAL_TABLET | Freq: Four times a day (QID) | ORAL | 0 refills | Status: DC | PRN

## 2019-09-03 MED ORDER — MIDODRINE HCL 5 MG PO TABS
10.0000 mg | ORAL_TABLET | Freq: Three times a day (TID) | ORAL | Status: DC
  Administered 2019-09-03: 10 mg via ORAL
  Filled 2019-09-03: qty 2

## 2019-09-03 NOTE — Social Work (Signed)
Patient will DC to:?[facility] date:?8/1 Family notified:? Transport by: Corey Harold  Per MD patient ready for DC to [facility] RN, patient, patient's family, and facility notified of DC. Discharge Summary sent to facility. RN given number for report [phone] room 201E DC packet on chart. Ambulance transport requested for patient.   CSW signing off.  [place], [place]

## 2019-09-03 NOTE — Discharge Summary (Addendum)
Physician Discharge Summary  Tina Watkins  YTK:[id number] DOB: 01/28/1966 DOA: 09/01/2019  PCP: [name], FNP  Admit date: 09/01/2019 Discharge date: 09/03/2019  Admitted From: SNF Disposition:  SNF  Recommendations for Outpatient Follow-up:  1. Follow up with PCP in 1-2 weeks 2. Follow up with scheduled HD, next is on Tues 3. Recommend repeat CBC within one week 4. Recommend referral to Hematologist for recurrent transfusion dependent anemia  NCCSR reviewed. Very limited prescription for dilaudid provided as narctotics require prescriptions when pt discharged to SNF  Discharge Condition:Improved CODE STATUS:Full Diet recommendation: Renal   Brief/Interim Summary: 54 y.o.femalewith medical history significant ofchronic CHF, failed renal transplant now with ESRD on TTS HD, DDD, chronic anenmia who presents from SNF after being found to have hgb in the 6 range.Ptwas recently discharged for treatment of healthcare associated pneumonia with acute gout flareup. Patient denies abdominal pain, active bleeding. Patient also denies feelings of near syncope, shortness of breath or chest pain.  ED Course:In the ED, repeat CBC noted a hemoglobin of 52.2. Given patient's increased weakness and 2 g drop in hemoglobin compared to last visit, PRBCs were ordered in the emergency department. Hospitalist service consulted for consideration for medical admission   Discharge Diagnoses:  Principal Problem:   Symptomatic anemia Active Problems:   ESRD (end stage renal disease) ([place])   History of kidney transplant   Anemia in chronic kidney disease   DDD (degenerative disc disease), cervical   Anemia  1. Symptomatic anemia 1. Presented with Hgb in the 6 range at facility, repeat hgb ofo 7.2 in ED 2. 1 unit PRBC;s ordered by EDP 3. Posttransfusion hemoglobin responded appropriately, withhemoglobin 8.7 by time of d/c 4. No findings of acute blood loss 5. Given recurrent  issues with transfusion dependent anemia, recommend Hematology referral 2. ESRD 1. On TTS HD 2. Cont as scheduled 3. Hx failed renal transplant 1. Chart reviewed 2. Despite failed transplant, pt is continued on immunosuppressant, will continue while in hospital 4. Anemia of chronic disease 1. Per above, PRBC being transfused 2. Recheck CBC in the morning 5. DDD 1. Seems to be stable at this time 1. Chronic hypotension 1. Chart reviewed. SBP in the 80's noted from prior admit 2. BP seems to be near baseline this AM 3. Continue midodrine as tolerated 4. Cont to monitor. Pt asymptomatic   Discharge Instructions   Allergies as of 09/03/2019      Reactions   Levofloxacin Itching, Rash   Tape Other (See Comments)   "Certain surgical tapes peel off my skin"      Medication List    TAKE these medications   allopurinol 100 MG tablet Commonly known as: ZYLOPRIM Take 1 tablet (100 mg total) by mouth daily.   aspirin EC 81 MG tablet Take 81 mg by mouth daily.   calcium acetate 667 MG capsule Commonly known as: PHOSLO Take 1 capsule (667 mg total) by mouth 3 (three) times daily with meals.   colchicine 0.6 MG tablet Take 0.5 tablets (0.3 mg total) by mouth daily.   diphenhydrAMINE 25 mg capsule Commonly known as: BENADRYL Take 1 capsule (25 mg total) by mouth every 8 (eight) hours as needed. What changed: reasons to take this   famotidine 20 MG tablet Commonly known as: Pepcid Take 1 tablet (20 mg total) by mouth 2 (two) times daily.   gabapentin 100 MG capsule Commonly known as: NEURONTIN Take 100 mg by mouth 2 (two) times daily.   HYDROmorphone 2 MG tablet  Commonly known as: Dilaudid Take 0.5 tablets (1 mg total) by mouth every 6 (six) hours as needed for severe pain.   latanoprost 0.005 % ophthalmic solution Commonly known as: XALATAN Place 1 drop into the left eye at bedtime.   lidocaine 5 % Commonly known as: LIDODERM Place 1 patch onto the skin daily as  needed (for pain- remove old patch first).   midodrine 10 MG tablet Commonly known as: PROAMATINE Take 10 mg by mouth 3 (three) times daily. What changed: Another medication with the same name was removed. Continue taking this medication, and follow the directions you see here.   ondansetron 4 MG disintegrating tablet Commonly known as: ZOFRAN-ODT Take 1 tablet (4 mg total) by mouth every 8 (eight) hours as needed for nausea or vomiting.   predniSONE 5 MG tablet Commonly known as: DELTASONE Take 1 tablet (5 mg total) by mouth daily with breakfast.   tacrolimus 1 MG capsule Commonly known as: PROGRAF Take 2-3 mg by mouth See admin instructions. Take 3 mg by mouth in the morning and 2 mg at bedtime   Vitamin D (Ergocalciferol) 1.25 MG (50000 UNIT) Caps capsule Commonly known as: DRISDOL Take 50,000 Units by mouth every Sunday.   zolpidem 10 MG tablet Commonly known as: AMBIEN Take 1 tablet (10 mg total) by mouth at bedtime as needed for sleep.       Follow-up Information    [name], FNP. Schedule an appointment as soon as possible for a visit in 2 week(s).   Specialty: Family Medicine Contact information: [place] [address] [phone]        Geralynn Rile, MD .   Specialties: Internal Medicine, Cardiology, Radiology Contact information: [place] [address] [phone]              Allergies  Allergen Reactions  . Levofloxacin Itching and Rash  . Tape Other (See Comments)    "Certain surgical tapes peel off my skin"    Consultations:  nephrology  Procedures/Studies: DG Chest Port 1 View  Result Date: 08/05/2019 CLINICAL DATA:  Fever. Dialysis in the RIGHT leg. Swelling of the RIGHT leg yesterday with progressing pain today. Patient missed dialysis today. EXAM: PORTABLE CHEST 1 VIEW COMPARISON:  07/07/2019 FINDINGS: Heart size is within normal limits. Interval development of LEFT LOWER lobe  consolidation/infiltrate with associated air bronchograms. Increased LEFT pleural effusion. Prominent interstitial markings are consistent with mild pulmonary edema and are increased. SVC stent. IMPRESSION: 1. Interval development of LEFT LOWER lobe consolidation/infiltrate and LEFT pleural effusion. 2. Increased interstitial pulmonary edema. Electronically Signed   By: Nolon Nations M.D.   On: 08/05/2019 12:53     Subjective: Eager to be discharged today  Discharge Exam: Vitals:   09/03/19 0407 09/03/19 0750  BP: (!) 88/51 (!) 87/50  Pulse: 92 85  Resp: 20 20  Temp: 98.1 F (36.7 C) 98.4 F (36.9 C)  SpO2: 100% 100%   Vitals:   09/02/19 2330 09/03/19 0006 09/03/19 0407 09/03/19 0750  BP: (!) 86/55 (!) 92/57 (!) 88/51 (!) 87/50  Pulse: 91 99 92 85  Resp: 18 20 20 20   Temp: 98.9 F (37.2 C) 97.8 F (36.6 C) 98.1 F (36.7 C) 98.4 F (36.9 C)  TempSrc: Oral Oral Oral Oral  SpO2: 100% 100% 100% 100%  Weight:   50.8 kg   Height:        General: Pt is alert, awake, not in acute distress Cardiovascular: RRR,  S1/S2 +, no rubs, no gallops Respiratory: CTA bilaterally, no wheezing, no rhonchi Abdominal: Soft, NT, ND, bowel sounds + Extremities: no edema, no cyanosis   The results of significant diagnostics from this hospitalization (including imaging, microbiology, ancillary and laboratory) are listed below for reference.     Microbiology: Recent Results (from the past 240 hour(s))  SARS Coronavirus 2 by RT PCR (hospital order, performed in [facility] hospital lab) Nasopharyngeal Nasopharyngeal Swab     Status: None   Collection Time: 09/01/19  5:51 PM   Specimen: Nasopharyngeal Swab  Result Value Ref Range Status   SARS Coronavirus 2 NEGATIVE NEGATIVE Final    Comment: (NOTE) SARS-CoV-2 target nucleic acids are NOT DETECTED.  The SARS-CoV-2 RNA is generally detectable in upper and lower respiratory specimens during the acute phase of infection. The  lowest concentration of SARS-CoV-2 viral copies this assay can detect is 250 copies / mL. A negative result does not preclude SARS-CoV-2 infection and should not be used as the sole basis for treatment or other patient management decisions.  A negative result may occur with improper specimen collection / handling, submission of specimen other than nasopharyngeal swab, presence of viral mutation(s) within the areas targeted by this assay, and inadequate number of viral copies (<250 copies / mL). A negative result must be combined with clinical observations, patient history, and epidemiological information.  Fact Sheet for Patients:   StrictlyIdeas.no  Fact Sheet for Healthcare Providers: BankingDealers.co.za  This test is not yet approved or  cleared by the [place] FDA and has been authorized for detection and/or diagnosis of SARS-CoV-2 by FDA under an Emergency Use Authorization (EUA).  This EUA will remain in effect (meaning this test can be used) for the duration of the COVID-19 declaration under Section 564(b)(1) of the Act, 21 U.S.C. section 360bbb-3(b)(1), unless the authorization is terminated or revoked sooner.  Performed at [facility], [place] [address], [place], [place] [address]      Labs: BNP (last 3 results) Recent Labs    04/17/19 1718 05/17/19 0235 07/07/19 1033  BNP 481.5* 506.4* 287.8*   Basic Metabolic Panel: Recent Labs  Lab 09/01/19 1203 09/02/19 0224  NA 137 139  K 3.5 3.9  CL 99 101  CO2 25 27  GLUCOSE 97 90  BUN 18 25*  CREATININE 4.57* 5.44*  CALCIUM 8.9 9.0   Liver Function Tests: Recent Labs  Lab 09/02/19 0224  AST 34  ALT 27  ALKPHOS 125  BILITOT 1.5*  PROT 5.4*  ALBUMIN 3.0*   No results for input(s): LIPASE, AMYLASE in the last 168 hours. No results for input(s): AMMONIA in the last 168 hours. CBC: Recent Labs  Lab 09/01/19 1203 09/02/19 0224 09/03/19 0314  WBC  7.1 5.9 5.8  HGB 7.2* 8.6* 8.7*  HCT 24.2* 27.6* 28.2*  MCV 111.5* 102.6* 100.4*  PLT 111* 113* 97*   Cardiac Enzymes: No results for input(s): CKTOTAL, CKMB, CKMBINDEX, TROPONINI in the last 168 hours. BNP: Invalid input(s): POCBNP CBG: Recent Labs  Lab 09/01/19 1702  GLUCAP 105*   D-Dimer No results for input(s): DDIMER in the last 72 hours. Hgb A1c No results for input(s): HGBA1C in the last 72 hours. Lipid Profile No results for input(s): CHOL, HDL, LDLCALC, TRIG, CHOLHDL, LDLDIRECT in the last 72 hours. Thyroid function studies No results for input(s): TSH, T4TOTAL, T3FREE, THYROIDAB in the last 72 hours.  Invalid input(s): FREET3 Anemia work up Recent Labs    09/02/19 0224  TIBC  182*  IRON 146   Urinalysis    Component Value Date/Time   COLORURINE AMBER (A) 08/05/2019 1835   APPEARANCEUR HAZY (A) 08/05/2019 1835   LABSPEC 1.021 08/05/2019 1835   PHURINE 5.0 08/05/2019 1835   GLUCOSEU NEGATIVE 08/05/2019 1835   HGBUR SMALL (A) 08/05/2019 1835   BILIRUBINUR NEGATIVE 08/05/2019 1835   BILIRUBINUR Negative 01/04/2019 1030   KETONESUR NEGATIVE 08/05/2019 1835   PROTEINUR 30 (A) 08/05/2019 1835   UROBILINOGEN 0.2 01/04/2019 1030   UROBILINOGEN 0.2 10/15/2014 0946   NITRITE NEGATIVE 08/05/2019 1835   LEUKOCYTESUR NEGATIVE 08/05/2019 1835   Sepsis Labs Invalid input(s): PROCALCITONIN,  WBC,  LACTICIDVEN Microbiology Recent Results (from the past 240 hour(s))  SARS Coronavirus 2 by RT PCR (hospital order, performed in [place] hospital lab) Nasopharyngeal Nasopharyngeal Swab     Status: None   Collection Time: 09/01/19  5:51 PM   Specimen: Nasopharyngeal Swab  Result Value Ref Range Status   SARS Coronavirus 2 NEGATIVE NEGATIVE Final    Comment: (NOTE) SARS-CoV-2 target nucleic acids are NOT DETECTED.  The SARS-CoV-2 RNA is generally detectable in upper and lower respiratory specimens during the acute phase of infection. The lowest concentration of  SARS-CoV-2 viral copies this assay can detect is 250 copies / mL. A negative result does not preclude SARS-CoV-2 infection and should not be used as the sole basis for treatment or other patient management decisions.  A negative result may occur with improper specimen collection / handling, submission of specimen other than nasopharyngeal swab, presence of viral mutation(s) within the areas targeted by this assay, and inadequate number of viral copies (<250 copies / mL). A negative result must be combined with clinical observations, patient history, and epidemiological information.  Fact Sheet for Patients:   StrictlyIdeas.no  Fact Sheet for Healthcare Providers: BankingDealers.co.za  This test is not yet approved or  cleared by the [place] FDA and has been authorized for detection and/or diagnosis of SARS-CoV-2 by FDA under an Emergency Use Authorization (EUA).  This EUA will remain in effect (meaning this test can be used) for the duration of the COVID-19 declaration under Section 564(b)(1) of the Act, 21 U.S.C. section 360bbb-3(b)(1), unless the authorization is terminated or revoked sooner.  Performed at [facility], [place] [address], [place], [place] [address]    Time spent: 30 min  SIGNED:   Marylu Lund, MD  [facility] 09/03/2019, 11:05 AM  If 7PM-7AM, please contact night-coverage

## 2019-09-03 NOTE — ED Notes (Addendum)
MD paged regarding patients pain.

## 2019-09-03 NOTE — Progress Notes (Signed)
Pt seen, no c/o this AM-  completed HD late last night - removed 2700--- Lowish BP since but no sxms.  Plan was to send her back to [facility] today.  hgb stable this AM at 8.7  Next HD should be Tuesday -  Will be as OP at [facility].  One thing we should maybe do is increase her ESA to max as OP and she wants [name] to look at possibly shortening her dialysis time  Louis Meckel

## 2019-09-03 NOTE — Progress Notes (Signed)
   09/02/19 2330  Hand-Off documentation  Handoff Given Given to shift RN/LPN  Report given to (Full Name) Manuela Schwartz, RN  Handoff Received Received from shift RN/LPN  Report received from (Full Name) Keanon Bevins, RN  Vital Signs  Temp 98.9 F (37.2 C)  Temp Source Oral  Pulse Rate 91  Pulse Rate Source Monitor  Resp 18  BP (!) 86/55  BP Location Left Arm  BP Method Automatic  Patient Position (if appropriate) Lying  Oxygen Therapy  SpO2 100 %  O2 Device Room Air  Pain Assessment  Pain Scale 0-10  Pain Score 4  Faces Pain Scale 2  Pain Type Surgical pain  Pain Location Back  Pain Orientation Lower  Pain Descriptors / Indicators Aching;Burning  Pain Frequency Constant  Post-Hemodialysis Assessment  Rinseback Volume (mL) 250 mL  KECN 260 V  Dialyzer Clearance Lightly streaked  Duration of HD Treatment -hour(s) 3.5 hour(s)  Hemodialysis Intake (mL) 500 mL  UF Total -Machine (mL) 3234 mL  Net UF (mL) 2734 mL  Tolerated HD Treatment Yes  Post-Hemodialysis Comments tx achieved as expected, well tolerated, no complaints  AVG/AVF Arterial Site Held (minutes) 0 minutes  AVG/AVF Venous Site Held (minutes) 0 minutes  Education / Care Plan  Dialysis Education Provided Yes  Hemodialysis Catheter Right Femoral vein Double lumen Permanent (Tunneled)  Placement Date/Time: 07/11/19 1511   Time Out: Correct patient;Correct site;Correct procedure  Maximum sterile barrier precautions: Hand hygiene;Cap;Mask;Sterile gloves;Large sterile sheet;Sterile gown  Site Prep: Chlorhexidine (preferred)  Local Anes...  Site Condition No complications  Blue Lumen Status Heparin locked;Capped (Central line)  Red Lumen Status Heparin locked;Capped (Central line)  Catheter fill solution Heparin 1000 units/ml  Catheter fill volume (Arterial) 2.1 cc  Catheter fill volume (Venous) 2.1  Dressing Type Occlusive  Dressing Status Clean;Dry;Intact  Interventions New dressing;Dressing changed  Drainage  Description None  Dressing Change Due 09/09/19  Post treatment catheter status Capped and Clamped

## 2019-09-03 NOTE — Social Work (Signed)
CSW reached out to [facility] to alert them that patient was medically stable for discharge today.  CSW had to leave a message.

## 2019-09-04 ENCOUNTER — Telehealth: Payer: Self-pay | Admitting: *Deleted

## 2019-09-04 DIAGNOSIS — J189 Pneumonia, unspecified organism: Secondary | ICD-10-CM

## 2019-09-04 LAB — BPAM RBC
Blood Product Expiration Date: 202108182359
Blood Product Expiration Date: 202108182359
ISSUE DATE / TIME: 202107301810
Unit Type and Rh: 7300
Unit Type and Rh: 7300

## 2019-09-04 LAB — TYPE AND SCREEN
ABO/RH(D): B POS
Antibody Screen: NEGATIVE
Unit division: 0
Unit division: 0

## 2019-09-04 NOTE — Telephone Encounter (Signed)
Contacted pt to complete transition of care assessment:   Transition Care Management Follow-up Telephone Call  . Medicaid Managed Care Transition Call Status:MM [facility] Call Made  . Date of discharge and from where: [facility], 09/03/19  . How have you been since you were released from the hospital? ok  . Any questions or concerns? Pt has chronic pain, and was not given pain medication; the pt says she is diagnosed with hypotension, so she always runs lw; because of that they would give her nothing even thought the 80s is her baseline BP. They tried to push OTC Tylenol  Items Reviewed: [name] Did the pt receive and understand the discharge instructions provided? Yes  . Medications obtained and verified? Yes  . Any new allergies since your discharge? Yes  . Dietary orders reviewed?No . Do you have support at home? Pt is currently at [place]: (I = Independent and D = Dependent)  ADLs: Dependent Bathing/Dressing:Dependent Meal Prep: Dependent Eating: Dependent Maintaining continence: Dependent Transferring/Ambulation: Dependent Managing Meds: Dependent Follow up appointments reviewed:  PCP Hospital f/u appt confirmed? No; pt in rehab [facility] f/u appt confirmed? Yes Dr Carlis Abbott, 09/19/19 at 1520  Are transportation arrangements needed? Yes   If their condition worsens, is the pt aware to call PCP or go to the EmergencyDept.?yes  Was the patient provided with contact information for the PCP's office or ED? yes  Was to pt encouraged to call back with questions or concerns? yes Pt is at [facility] temporaliiy on how well she does.The pt says she has  been there since 08/24/19; LCSW previously completed TOC. Pt has questions related to her stay at rehab, and follow up appts. but pt has questions.  Lenor Coffin, RN, BSN, [facility] [phone]

## 2019-09-05 ENCOUNTER — Telehealth: Payer: Self-pay | Admitting: Hematology and Oncology

## 2019-09-05 IMAGING — US US RENAL TRANSPLANT
1 series · 14 of 25 positions shown · non-contrast
Comparison: None.

CLINICAL DATA: Acute renal injury.  Renal transplant.

EXAM:
ULTRASOUND OF RENAL TRANSPLANT WITH RENAL DOPPLER ULTRASOUND
TECHNIQUE: Ultrasound examination of the renal transplant was performed with
gray-scale, color and duplex doppler evaluation.

[Series 1: us renal transplant · 0.20mm/px · 14 of 27 slices shown]
[im 1/27]
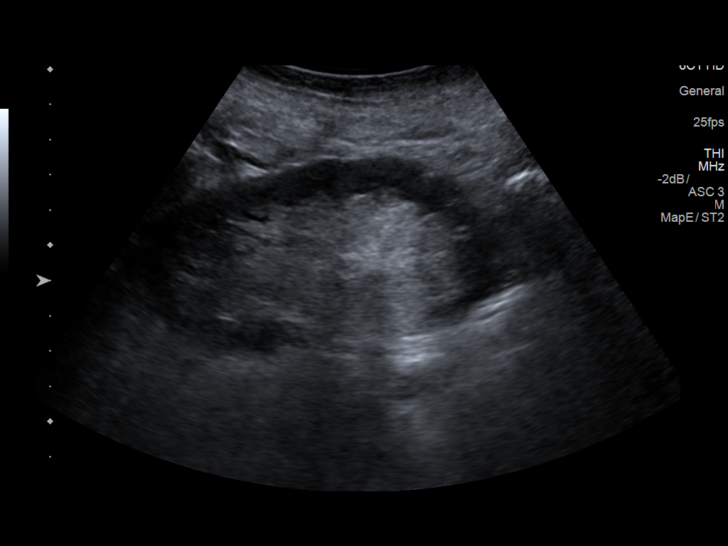
[im 3/27]
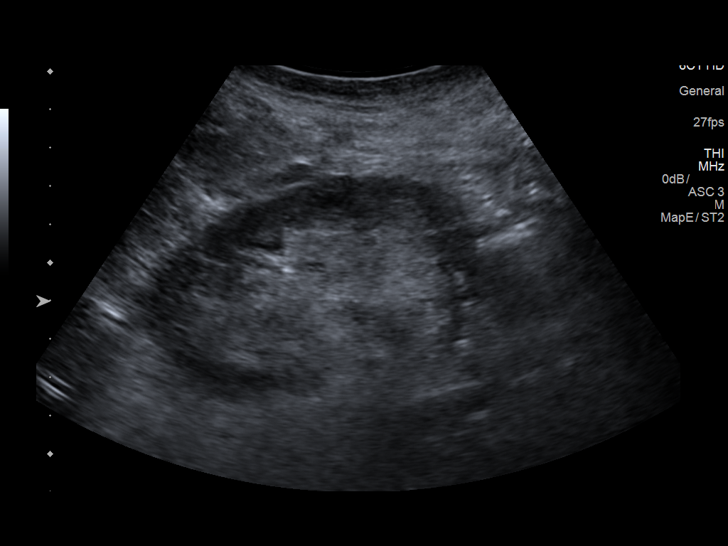
[im 5/27]
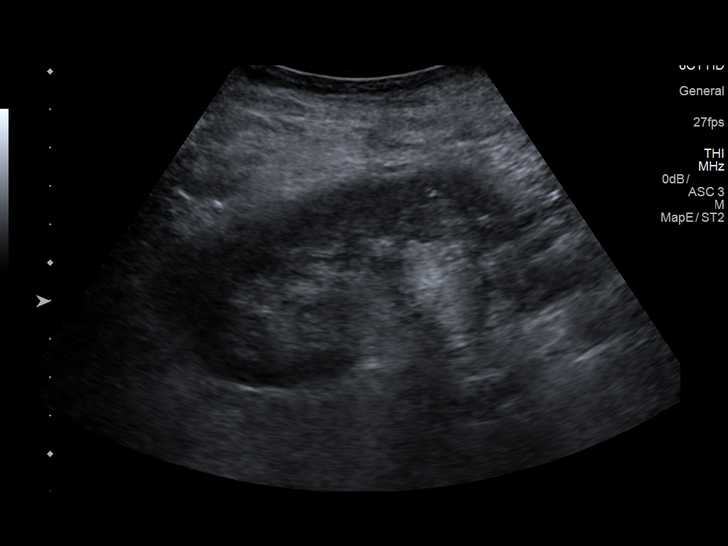
[im 7/27]
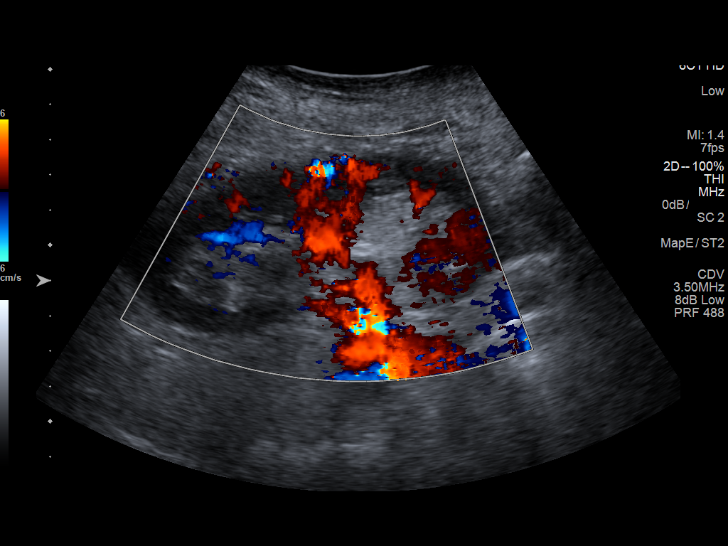
[im 9/27]
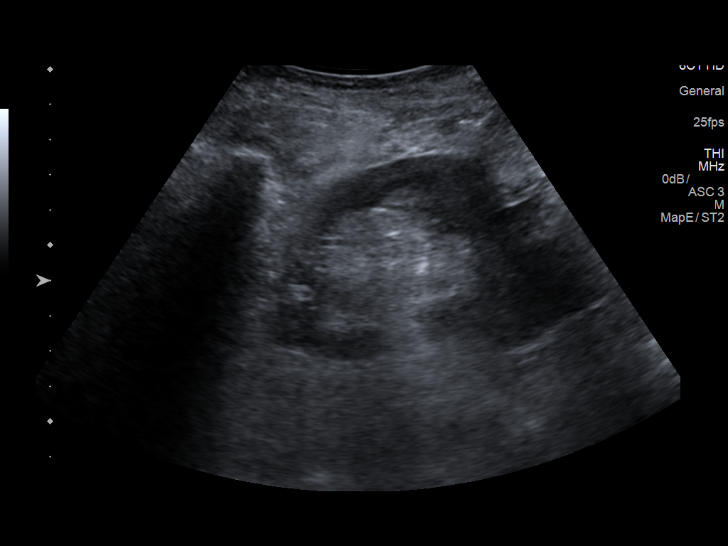
[im 10/27]
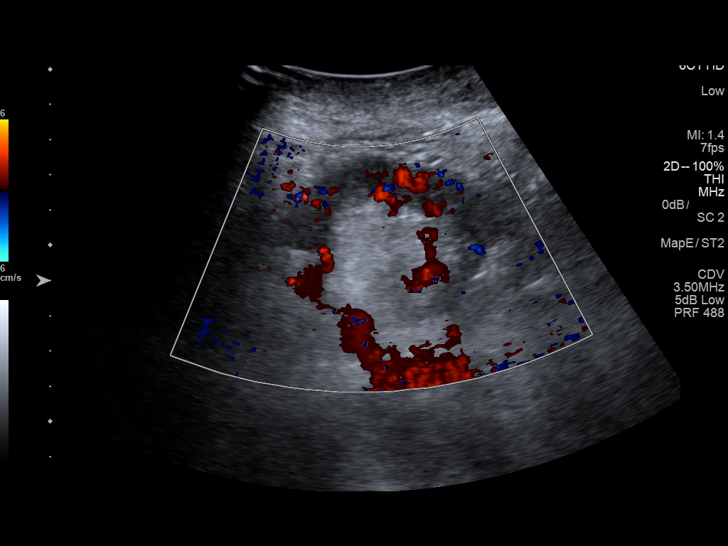
[im 12/27]
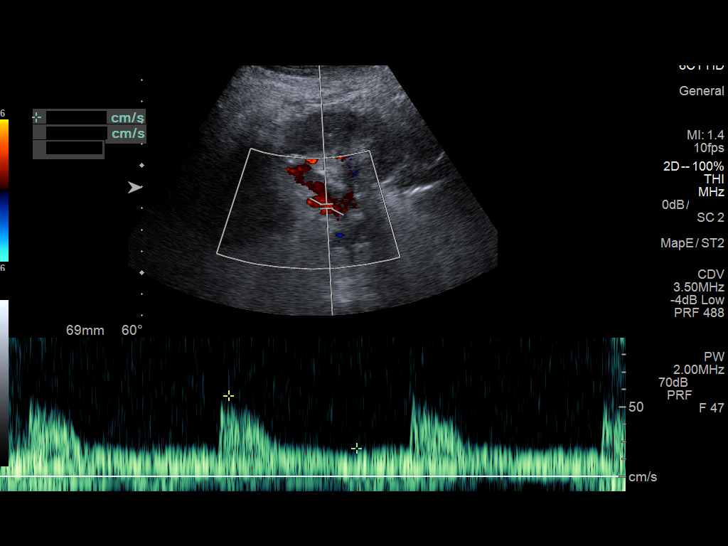
[im 15/27]
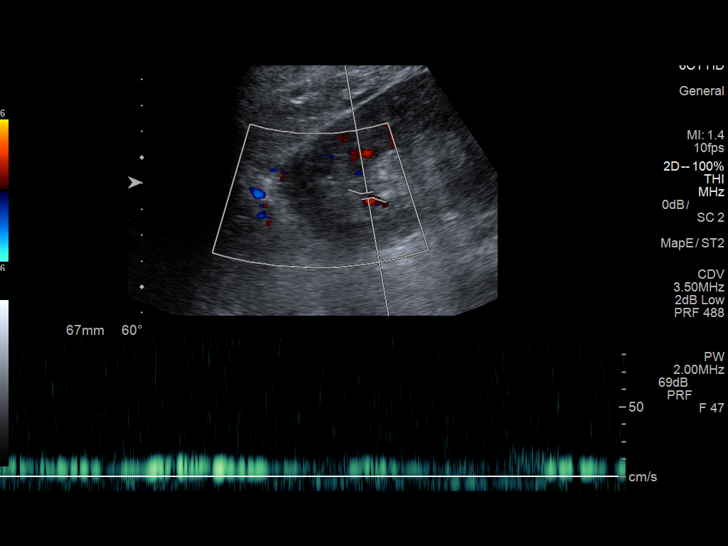
[im 17/27]
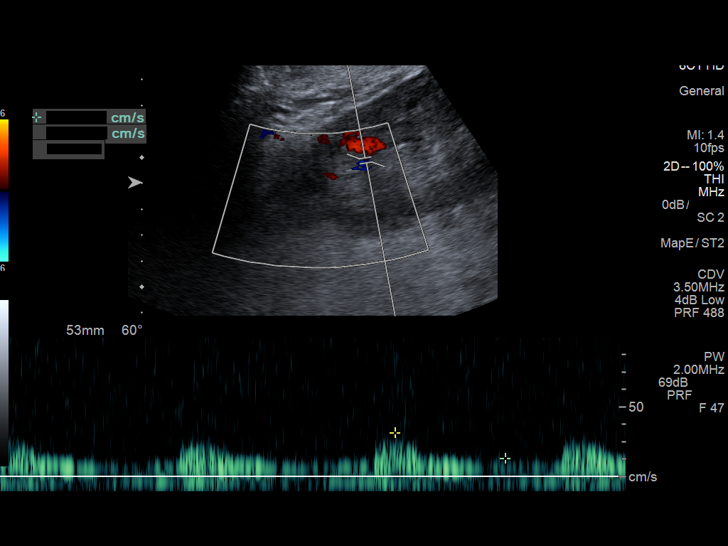
[im 18/27]
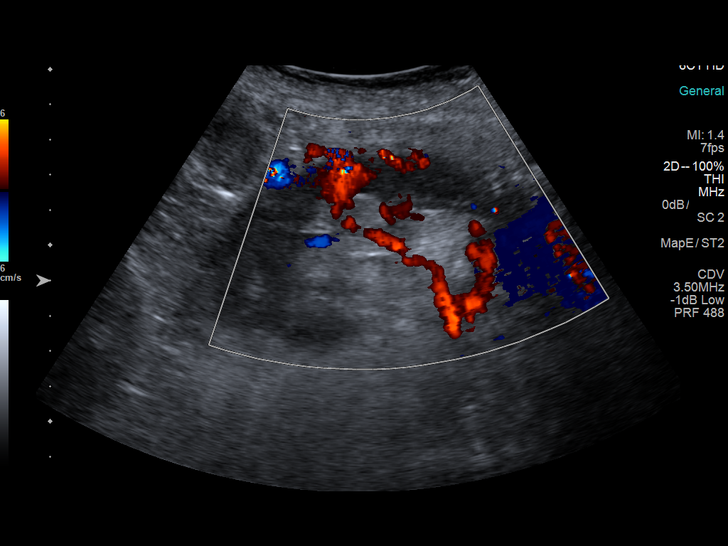
[im 20/27]
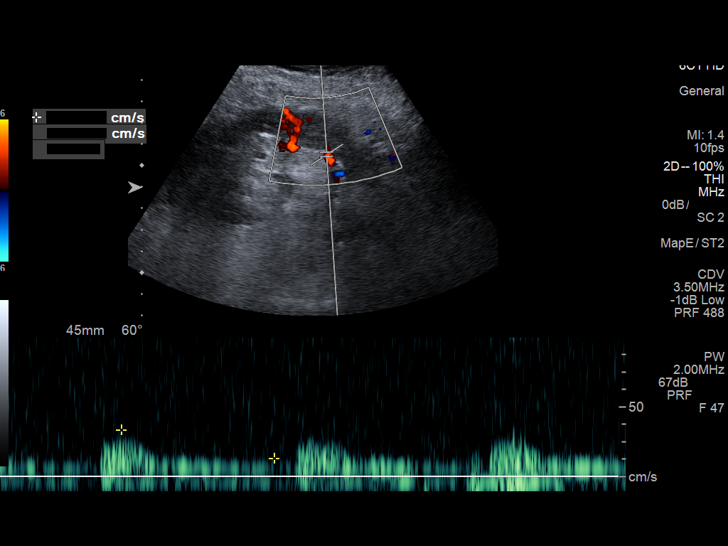
[im 22/27]
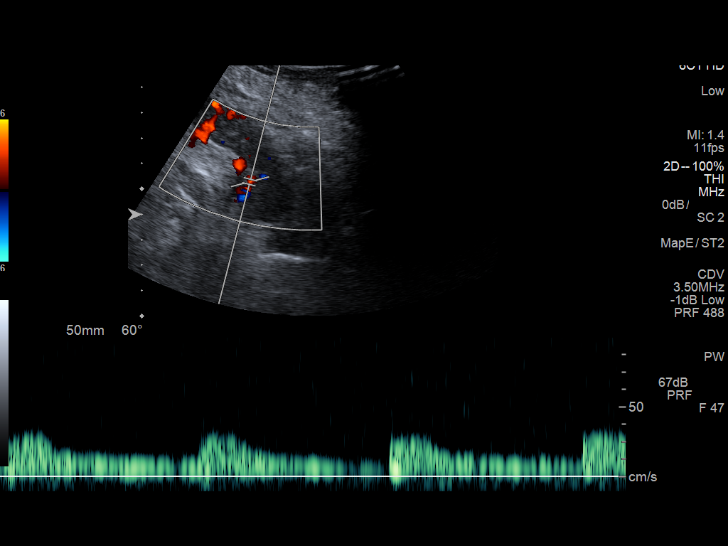
[im 24/27]
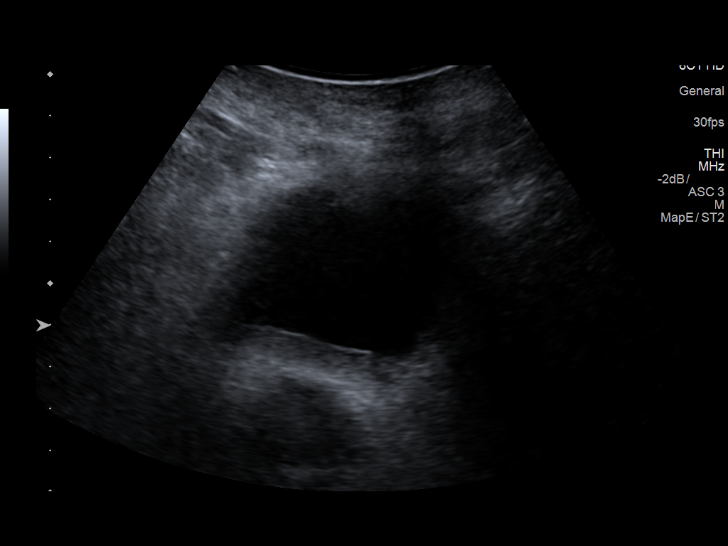
[im 27/27]
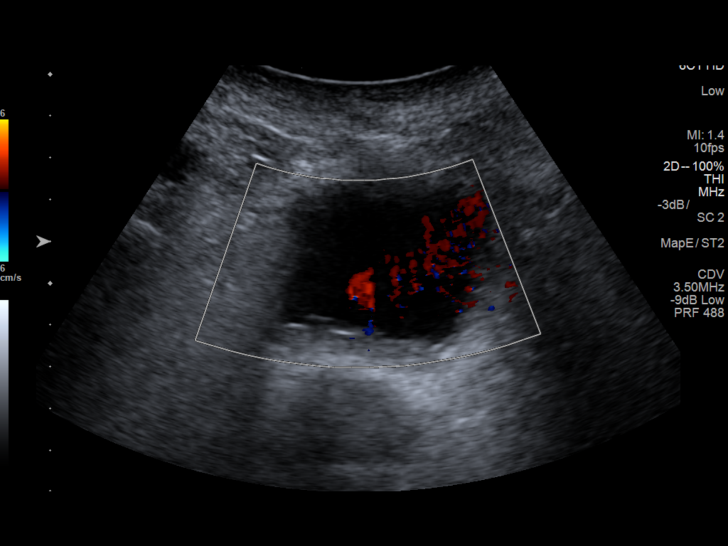

[14 of 25 positions shown; findings below may reference images not displayed]

FINDINGS: Transplant kidney location: Right lower quadrant

Transplant Kidney:

Length: 11.2 cm. Normal in size and parenchymal echogenicity. No
evidence of mass or hydronephrosis. No peri-transplant fluid
collection seen.

Color flow in the main renal artery:  Yes

Color flow in the main renal vein:  Yes

Duplex Doppler Evaluation:

Main Renal Artery Resistive Index:

Venous waveform in main renal vein:  Present

Intrarenal resistive index in upper pole:

(normal 0.6-0.8; equivocal 0.8-0.9; abnormal >= 0.9)

Intrarenal resistive index in lower pole:

(normal 0.6-0.8; equivocal 0.8-0.9; abnormal >= 0.9)

Bladder: Normal for degree of bladder distention.

Other findings:  Right-sided ureteral jet is demonstrated.
IMPRESSION: Normal resistive indices for the transplanted right lower quadrant
kidney.

## 2019-09-05 NOTE — Telephone Encounter (Signed)
Received a new hem referral from the hospital for anemia. Tina Watkins has been cld and scheduled to see Dr. Lorenso Courier on 8/13 at 8am. Pt aware to arrive 15 minutes early.

## 2019-09-06 ENCOUNTER — Other Ambulatory Visit: Payer: Self-pay

## 2019-09-06 DIAGNOSIS — N186 End stage renal disease: Secondary | ICD-10-CM

## 2019-09-06 NOTE — Addendum Note (Signed)
Addended byDoylene Bode on: 09/06/2019 09:59 AM   Modules accepted: Orders

## 2019-09-08 DIAGNOSIS — M542 Cervicalgia: Secondary | ICD-10-CM | POA: Diagnosis not present

## 2019-09-08 DIAGNOSIS — I959 Hypotension, unspecified: Secondary | ICD-10-CM | POA: Diagnosis not present

## 2019-09-08 DIAGNOSIS — G8929 Other chronic pain: Secondary | ICD-10-CM | POA: Diagnosis not present

## 2019-09-08 DIAGNOSIS — Z79899 Other long term (current) drug therapy: Secondary | ICD-10-CM | POA: Diagnosis not present

## 2019-09-08 DIAGNOSIS — M549 Dorsalgia, unspecified: Secondary | ICD-10-CM | POA: Diagnosis not present

## 2019-09-13 ENCOUNTER — Other Ambulatory Visit: Payer: Self-pay

## 2019-09-13 DIAGNOSIS — M5090 Cervical disc disorder, unspecified, unspecified cervical region: Secondary | ICD-10-CM | POA: Diagnosis not present

## 2019-09-13 DIAGNOSIS — I959 Hypotension, unspecified: Secondary | ICD-10-CM | POA: Diagnosis not present

## 2019-09-13 DIAGNOSIS — I5032 Chronic diastolic (congestive) heart failure: Secondary | ICD-10-CM | POA: Diagnosis not present

## 2019-09-13 DIAGNOSIS — Z992 Dependence on renal dialysis: Secondary | ICD-10-CM | POA: Diagnosis not present

## 2019-09-13 DIAGNOSIS — F419 Anxiety disorder, unspecified: Secondary | ICD-10-CM | POA: Diagnosis not present

## 2019-09-13 DIAGNOSIS — N186 End stage renal disease: Secondary | ICD-10-CM | POA: Diagnosis not present

## 2019-09-13 DIAGNOSIS — D696 Thrombocytopenia, unspecified: Secondary | ICD-10-CM | POA: Diagnosis not present

## 2019-09-13 DIAGNOSIS — D631 Anemia in chronic kidney disease: Secondary | ICD-10-CM | POA: Diagnosis not present

## 2019-09-13 DIAGNOSIS — K219 Gastro-esophageal reflux disease without esophagitis: Secondary | ICD-10-CM | POA: Diagnosis not present

## 2019-09-13 DIAGNOSIS — Z8616 Personal history of COVID-19: Secondary | ICD-10-CM | POA: Diagnosis not present

## 2019-09-13 DIAGNOSIS — Z8673 Personal history of transient ischemic attack (TIA), and cerebral infarction without residual deficits: Secondary | ICD-10-CM | POA: Diagnosis not present

## 2019-09-13 DIAGNOSIS — M109 Gout, unspecified: Secondary | ICD-10-CM | POA: Diagnosis not present

## 2019-09-13 NOTE — Patient Outreach (Addendum)
  Medicaid Managed Care   Social Work Note  09/13/2019 Name: Tina Watkins MRN: [id number] DOB: [birth year]  Tina Watkins is a 54 y.o. year old female who sees [name], FNP for primary care. The  Medicaid Managed Care team was consulted for assistance with [employer] . Ms. Miers was given information about Care Management services, agreed to services, and verbal consent for services was obtained.  SDOH (Social Determinants of Health) assessments performed: Yes SDOH Interventions     Most Recent Value  SDOH Interventions  SDOH Interventions for the Following Domains Tobacco  Tobacco Interventions Intervention Not Indicated, Other (Comment)  [Patient stated while she is in rehab, the facility's doctor will provide the nicotine patches for her.]        Goals Addressed              This Visit's Progress   .  Patient Stated (pt-stated)        "I want to get some physical therapy when I am back at home. I want to be able to walk normal without any type of aid to assist me."    .  Patient Stated (pt-stated)        "I want an aide to come to the home to help me a little bit with some light housework and meal preparation."        Follow Up Plan: The Managed Medicaid care management team will reach out to the patient again over the next 7 days.  Telephone follow up appointment with Managed Medicaid care management team member scheduled for: 30 days. Patient is currently in rehab at [facility], and she is not quite sure when she will be discharged. The goals above reflect her desired goals for when she returns home.  Netta Neat, MSW, LCSW Managed Medicaid Clinical Social Work   09/13/2019

## 2019-09-14 DIAGNOSIS — N186 End stage renal disease: Secondary | ICD-10-CM | POA: Diagnosis not present

## 2019-09-14 DIAGNOSIS — Z992 Dependence on renal dialysis: Secondary | ICD-10-CM | POA: Diagnosis not present

## 2019-09-14 DIAGNOSIS — T8249XA Other complication of vascular dialysis catheter, initial encounter: Secondary | ICD-10-CM | POA: Diagnosis not present

## 2019-09-14 NOTE — Progress Notes (Signed)
[facility] [phone]   [phone]  INITIAL CONSULT NOTE  Patient Care Team: Kallie Locks, FNP as PCP - General (Family Medicine) O'Neal, Ronnald Ramp, MD as PCP - Cardiology (Cardiology) Roselyn Bering, LCSW as Social Worker (Licensed Clinical Social Worker)  Hematological/Oncological History #Macrocytic Anemia 1) 08/25/2018: WBC 4.0, Hgb 9.7, MCV 101.6, Plt 151 2) 05/18/2019: WBC 3.2, Hgb 8.1. ,MCV 100.8, Plt 149 3) 05/09/2019-09/01/2019: patient received 7 transfusions for Hgb <7.0 3)  09/03/2019: WBC 5.8, Hgb 8.7, Plt 97, MCV 100.4 4) 09/15/2019: establish care with Dr. Leonides Schanz   CHIEF COMPLAINTS/PURPOSE OF CONSULTATION:  "Anemia "  HISTORY OF PRESENTING ILLNESS:  Tina Watkins 54 y.o. female with medical history significant for CHF, ESRD on HD TTS, gastroparesis, GERD, CVA, and gout who presents for evaluation of longstanding anemia.   On review of the previous records Mrs. Lantier has longstanding anemia dating back to at least 2010.  On review of the records she did not have macrocytosis until most recently in the last few years.  Records show that on 08/25/2018 the patient was found to have white blood cell count of 4.0, hemoglobin 9.7, MCV of 101.6, and a platelet count of 151.  Additionally the patient had labs on 05/18/2019 which showed a white blood cell count of 3.2, hemoglobin 8.1 MCV of 108, and platelet count of 149.  Most recently on 09/03/2019 the patient was found to have hemoglobin 8.7, platelets 97, and MCV of 100.4.  In the last year the patient has required at least 7 blood transfusions from 05/09/2019 until 09/01/2019.  Due to concern for this patient's longstanding anemia and recent transfusion requirement she was referred to hematology for further evaluation and management  On exam today Ms. Gaertner notes that she has had anemia for as long as she can remember.  She reports that she is frightened that her hemoglobin is now  dropping below 8 on a regular basis.  She notes that when she does drop below 8 she does not feel any symptoms specifically, but does have the sensation of feeling more sluggish and slower than usual.  She is unable to differentiate these from the general symptoms of ESRD.  She reports that she is getting a shot at her dialysis center to help stimulate her production of red blood cells.  On further discussion she notes that she recently had a nosebleed, but that this is a rare occurrence.  She denies having any other overt signs of bleeding such as dark stools, blood in her stool, blood in her urine, or other overt signs of bleeding.  She states that she does have a history of gastroparesis and that her solution for this is eating slowly.  She notes that she does not eat any beef, but does tend to eat chicken a lot and occasional pork and fish.  She does enjoy vegetables but does not eat them as much as she should.  In terms of menstrual cycle she reports that she stopped having her cycles in her late 70s or early 30s.  In terms of family history the patient notes that her father died of prostate cancer but may have also had pancreatic cancer.  She reports that her mother is healthy other than some sciatica, her brother has multiple sclerosis, and her maternal grandmother has glaucoma.  She has no other remarkable family medical history.  She is an active smoker but is trying to quit at this time.  Other than some occasional  sweats she notes that she is not having any other new or concerning symptoms.  She denies having fevers, chills, nausea, vomiting, diarrhea, shortness of breath, or chest pain.  A full 10 point ROS is listed below.  MEDICAL HISTORY:  Past Medical History:  Diagnosis Date  . Bacteremia due to Gram-negative bacteria 05/23/2011  . Blind    right eye  . Blind right eye   . CHF (congestive heart failure) ([place])   . Chronic lower back pain   . Complication of anesthesia    "woke up  during OR; I have an extremely high tolerance" (12/11/2011) 1 procedure was graft; the other procedures were procedures that are typically done with sedation.  . DDD (degenerative disc disease), cervical   . Depression   . Dysrhythmia    "tachycardia" (12/11/2011) new onset afib 10/15/14 EKG  . E coli bacteremia 06/18/2011  . Elevated LDL cholesterol level 12/2018  . ESRD (end stage renal disease) ([place]) 06/12/2011  . Fibromyalgia   . Gastroparesis   . Gastroparesis   . GERD (gastroesophageal reflux disease)   . Glaucoma    right eye  . Gout   . H/O carpal tunnel syndrome   . Headache(784.0)    "not often anymore" (12/11/2011)  . Herpes genitalia 1994  . History of blood transfusion    "more than a few times" (12/11/2011)  . History of stomach ulcers   . Hypotension   . Iron deficiency anemia   . New onset a-fib ([place])    10/15/14 EKG  . Osteopenia   . Peripheral neuropathy 11/2018  . Pressure ulcer of sacral region, stage 1 07/2019  . Seizures ([place]) 1994   "post transplant; only have had that one" (12/11/2011)  . Spinal stenosis in cervical region   . Stroke [facility])     left basal ganglia lacunar infarct; Right frontal lobe lacunar infarct.  . Stroke [facility]) ~ 1999; 2001   "briefly lost my vision; lost my right eye" (12/11/2011)  . Vitamin D deficiency 12/2018    SURGICAL HISTORY: Past Surgical History:  Procedure Laterality Date  . ANTERIOR CERVICAL DECOMP/DISCECTOMY FUSION N/A 01/08/2015   Procedure: Anterior Cervical Three-Four/Four-Five Decompression/Diskectomy/Fusion;  Surgeon: Leeroy Cha, MD;  Location: [place] NEURO ORS;  Service: Neurosurgery;  Laterality: N/A;  C3-4 C4-5 Anterior cervical decompression/diskectomy/fusion  . APPENDECTOMY  ~ 2004  . CATARACT EXTRACTION     right eye  . COLONOSCOPY    . ENUCLEATION  2001   "right"  . ESOPHAGOGASTRODUODENOSCOPY (EGD) WITH PROPOFOL N/A 04/21/2012   Procedure: ESOPHAGOGASTRODUODENOSCOPY (EGD) WITH PROPOFOL;  Surgeon: Milus Banister, MD;  Location: [facility] ENDOSCOPY;  Service: Endoscopy;  Laterality: N/A;  . 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 IR FLUORO GUIDE CV LINE RIGHT  07/11/2019  . IR RADIOLOGY PERIPHERAL GUIDED IV START  07/11/2019  . IR US GUIDE VASC ACCESS RIGHT  07/11/2019  . IR US GUIDE VASC ACCESS RIGHT  07/11/2019  . IR US GUIDE VASC ACCESS RIGHT  07/11/2019  . [place]; 1999; 2005   "right"  . MULTIPLE TOOTH EXTRACTIONS    . RIGHT HEART CATH N/A 03/23/2019   Procedure: RIGHT HEART CATH;  Surgeon: Jolaine Artist, MD;  Location: [place] CV LAB;  Service: Cardiovascular;  Laterality: N/A;  . TONSILLECTOMY    . TOTAL NEPHRECTOMY  1988?; 1994; 2005    SOCIAL HISTORY: Social History  Socioeconomic History  . Marital status: Single    Spouse name: Not on file  . Number of children: 0  . Years of education: some college  . Highest education level: Not on file  Occupational History  . Occupation: disabled    Fish farm manager: UNEMPLOYED  Tobacco Use  . Smoking status: Current Every Day Smoker    Packs/day: 0.50    Years: 35.00    Pack years: 17.50    Types: Cigarettes  . Smokeless tobacco: Never Used  . Tobacco comment: Patient stated she last smoked in February 2021, which was the first time she was hospitalized this year. She has been in and out of the hospital several times since then,   Vaping Use  . Vaping Use: Never used  Substance and Sexual Activity  . Alcohol use: Yes    Comment: rare  . Drug use: Yes    Types: Marijuana    Comment: occasional use  . Sexual activity: Not Currently  Other Topics Concern  . Not on file  Social History Narrative   Right-handed.   Four cups caffeine per day.   Lives alone.   Social Determinants of Health   Financial Resource Strain:   . Difficulty of Paying Living Expenses:   Food Insecurity:   . Worried About Charity fundraiser in the Last Year:   . Arboriculturist in  the Last Year:   Transportation Needs:   . Film/video editor (Medical):   [name] Lack of Transportation (Non-Medical):   Physical Activity:   . Days of Exercise per Week:   . Minutes of Exercise per Session:   Stress:   . Feeling of Stress :   Social Connections:   . Frequency of Communication with Friends and Family:   . Frequency of Social Gatherings with Friends and Family:   . Attends Religious Services:   . Active Member of Clubs or Organizations:   . Attends Archivist Meetings:   [name] Marital Status:   Intimate Partner Violence:   . Fear of Current or Ex-Partner:   . Emotionally Abused:   [name] Physically Abused:   . Sexually Abused:     FAMILY HISTORY: Family History  Problem Relation Age of Onset  . Glaucoma Mother   . Pancreatic cancer Father   . Multiple sclerosis Brother   . Hypertension Maternal Grandmother   . Breast cancer Neg Hx     ALLERGIES:  is allergic to levofloxacin and tape.  MEDICATIONS:  Current Outpatient Medications  Medication Sig Dispense Refill  . Methoxy PEG-Epoetin Beta (MIRCERA IJ) Mircera    . allopurinol (ZYLOPRIM) 100 MG tablet Take 1 tablet (100 mg total) by mouth daily. 30 tablet 0  . aspirin EC 81 MG tablet Take 81 mg by mouth daily.     . calcium acetate (PHOSLO) 667 MG capsule Take 1 capsule (667 mg total) by mouth 3 (three) times daily with meals. 90 capsule 0  . colchicine 0.6 MG tablet Take 0.5 tablets (0.3 mg total) by mouth daily. (Patient not taking: Reported on 09/15/2019) 30 tablet 0  . diphenhydrAMINE (BENADRYL) 25 mg capsule Take 1 capsule (25 mg total) by mouth every 8 (eight) hours as needed. (Patient taking differently: Take 25 mg by mouth every 8 (eight) hours as needed for itching. ) 30 capsule 6  . famotidine (PEPCID) 20 MG tablet Take 1 tablet (20 mg total) by mouth 2 (two) times daily. 60 tablet 11  . gabapentin (NEURONTIN) 100  MG capsule Take 100 mg by mouth 2 (two) times daily.     [name] HYDROmorphone (DILAUDID)  2 MG tablet Take 0.5 tablets (1 mg total) by mouth every 6 (six) hours as needed for severe pain. 10 tablet 0  . latanoprost (XALATAN) 0.005 % ophthalmic solution Place 1 drop into the left eye at bedtime.  3  . lidocaine (LIDODERM) 5 % Place 1 patch onto the skin daily as needed (for pain- remove old patch first).     . midodrine (PROAMATINE) 10 MG tablet Take 10 mg by mouth 3 (three) times daily.    . ondansetron (ZOFRAN-ODT) 4 MG disintegrating tablet Take 1 tablet (4 mg total) by mouth every 8 (eight) hours as needed for nausea or vomiting. 20 tablet 11  . oxyCODONE-acetaminophen (PERCOCET) 10-325 MG tablet Take 1 tablet by mouth 4 (four) times daily as needed.    . predniSONE (DELTASONE) 5 MG tablet Take 1 tablet (5 mg total) by mouth daily with breakfast. 30 tablet 0  . tacrolimus (PROGRAF) 1 MG capsule Take 2-3 mg by mouth See admin instructions. Take 3 mg by mouth in the morning and 2 mg at bedtime    . Vitamin D, Ergocalciferol, (DRISDOL) 1.25 MG (50000 UNIT) CAPS capsule Take 50,000 Units by mouth every Sunday.     . zolpidem (AMBIEN) 10 MG tablet Take 1 tablet (10 mg total) by mouth at bedtime as needed for sleep. 10 tablet 0   No current facility-administered medications for this visit.    REVIEW OF SYSTEMS:   Constitutional: ( - ) fevers, ( - )  chills , ( - ) night sweats Eyes: ( - ) blurriness of vision, ( - ) double vision, ( - ) watery eyes Ears, nose, mouth, throat, and face: ( - ) mucositis, ( - ) sore throat Respiratory: ( - ) cough, ( - ) dyspnea, ( - ) wheezes Cardiovascular: ( - ) palpitation, ( - ) chest discomfort, ( - ) lower extremity swelling Gastrointestinal:  ( - ) nausea, ( - ) heartburn, ( - ) change in bowel habits Skin: ( - ) abnormal skin rashes Lymphatics: ( - ) new lymphadenopathy, ( - ) easy bruising Neurological: ( - ) numbness, ( - ) tingling, ( - ) new weaknesses Behavioral/Psych: ( - ) mood change, ( - ) new changes  All other systems were reviewed  with the patient and are negative.  PHYSICAL EXAMINATION: ECOG PERFORMANCE STATUS: 1 - Symptomatic but completely ambulatory  Vitals:   09/15/19 0811  BP: (!) 106/55  Pulse: 77  Resp: 18  Temp: 97.6 F (36.4 C)  SpO2: 100%   Filed Weights   09/15/19 0811  Weight: 118 lb 14.4 oz (53.9 kg)    GENERAL: well appearing middle aged [place] American female in NAD  SKIN: skin color, texture, turgor are normal, no rashes or significant lesions EYES: conjunctiva are pink and non-injected, sclera clear in left eye. Right eye is nonfunctional.  OROPHARYNX: no exudate, no erythema; lips, buccal mucosa, and tongue normal. Top dentures.  LUNGS: clear to auscultation and percussion with normal breathing effort HEART: regular rate & rhythm and no murmurs and no lower extremity edema Musculoskeletal: no cyanosis of digits and no clubbing  PSYCH: alert & oriented x 3, fluent speech NEURO: no focal motor/sensory deficits  LABORATORY DATA:  I have reviewed the data as listed CBC Latest Ref Rng & Units 09/03/2019 09/02/2019 09/01/2019  WBC 4.0 - 10.5 K/uL 5.8 5.9 7.1  Hemoglobin 12.0 - 15.0 g/dL 8.7(L) 8.6(L) 7.2(L)  Hematocrit 36 - 46 % 28.2(L) 27.6(L) 24.2(L)  Platelets 150 - 400 K/uL 97(L) 113(L) 111(L)    CMP Latest Ref Rng & Units 09/02/2019 09/01/2019 08/24/2019  Glucose 70 - 99 mg/dL 90 97 84  BUN 6 - 20 mg/dL 25(H) 18 59(H)  Creatinine 0.44 - 1.00 mg/dL 5.44(H) 4.57(H) 4.34(H)  Sodium 135 - 145 mmol/L 139 137 133(L)  Potassium 3.5 - 5.1 mmol/L 3.9 3.5 3.9  Chloride 98 - 111 mmol/L 101 99 96(L)  CO2 22 - 32 mmol/L _0 Calcium 8.9 - 10.3 mg/dL 9.0 8.9 9.2  Total Protein 6.5 - 8.1 g/dL 5.4(L) - -  Total Bilirubin 0.3 - 1.2 mg/dL 1.5(H) - -  Alkaline Phos 38 - 126 U/L 125 - -  AST 15 - 41 U/L 34 - -  ALT 0 - 44 U/L 27 - -    RADIOGRAPHIC STUDIES: No results found.  ASSESSMENT & PLAN Kelie Gainey 54 y.o. female with medical history significant for CHF, ESRD on HD  TTS, gastroparesis, GERD, CVA, and gout who presents for evaluation of longstanding anemia.  After review the labs, review the records, discussion with the patient her findings are most consistent with a macrocytic anemia with multifactorial causes.  I do believe that the patient's renal dysfunction certainly plays a role in this, however that does not normally cause a macrocytic anemia.  She has been evaluated for vitamin B12 and folate deficiencies previously, but never had methylmalonic acid and homocysteine checked.  Other possible etiologies for the patient's macrocytic anemia would include hematological malignancies such as multiple myeloma or myelodysplastic syndrome, hemolysis, or medication side effect.  Today we will do a robust work-up to try to find the etiology of this patient's anemia.  If no clear etiology can be discerned a bone marrow biopsy would need to be performed in order to help rule out myelodysplastic syndrome.  #Macrocytic Anemia #Anemia in the Setting of ESRD -- full nutritional evaluation with Vitamin B12, folate, MMA, and homocysteine. Additionally will check copper.  --iron studies done last month, no evidence of iron deficiency --hemolysis workup with LDH, hatpo, and DAT --repeat CBC and CMP. Will check reticulocyte panel.  --MM wokrup with SPEP and SFLC -- will discuss with nephrology to clarify what patient is receiving with dialysis (EPO? Iron?) --if no clear etiology can be found based off the above bloodwork will need to pursue a bone marrow biopsy --RTC set for 4 weeks from now. Can see sooner if Bmbx or nutritional repletion is required.   Orders Placed This Encounter  Procedures  . CBC with Differential (Cancer Center Only)    Standing Status:   Future    Number of Occurrences:   1    Standing Expiration Date:   09/13/2020  . Retic Panel    Standing Status:   Future    Number of Occurrences:   1    Standing Expiration Date:   09/13/2020  . Save Smear  ([facility])    Standing Status:   Future    Number of Occurrences:   1    Standing Expiration Date:   09/13/2020  . CMP ([place] only)    Standing Status:   Future    Number of Occurrences:   1    Standing Expiration Date:   09/13/2020  . Lactate dehydrogenase (LDH)    Standing Status:   Future    Number of Occurrences:  1    Standing Expiration Date:   09/13/2020  . SPEP with reflex to IFE    Standing Status:   Future    Number of Occurrences:   1    Standing Expiration Date:   09/13/2020  . Kappa/lambda light chains    Standing Status:   Future    Number of Occurrences:   1    Standing Expiration Date:   09/13/2020  . Haptoglobin    Standing Status:   Future    Number of Occurrences:   1    Standing Expiration Date:   09/13/2020  . Methylmalonic acid, serum    Standing Status:   Future    Number of Occurrences:   1    Standing Expiration Date:   09/13/2020  . Homocysteine, serum    Standing Status:   Future    Number of Occurrences:   1    Standing Expiration Date:   09/13/2020  . Erythropoietin    Standing Status:   Future    Number of Occurrences:   1    Standing Expiration Date:   09/13/2020  . TSH    Standing Status:   Future    Number of Occurrences:   1    Standing Expiration Date:   09/14/2020  . Copper, serum    Standing Status:   Future    Number of Occurrences:   1    Standing Expiration Date:   09/14/2020  . Direct antiglobulin test ([place])    Standing Status:   Future    Number of Occurrences:   1    Standing Expiration Date:   09/13/2020  . Sample to Blood Bank    Standing Status:   Future    Number of Occurrences:   1    Standing Expiration Date:   09/14/2020    All questions were answered. The patient knows to call the clinic with any problems, questions or concerns.  A total of more than 60 minutes were spent on this encounter and over half of that time was spent on counseling and coordination of care as outlined above.   Ledell Peoples,  MD Department of Hematology/Oncology [facility] Phone: [phone] Pager: [phone] Email: [email or web address]  09/15/2019 9:43 AM

## 2019-09-15 ENCOUNTER — Inpatient Hospital Stay: Payer: Medicare Other | Attending: Hematology and Oncology | Admitting: Hematology and Oncology

## 2019-09-15 ENCOUNTER — Encounter: Payer: Self-pay | Admitting: Hematology and Oncology

## 2019-09-15 ENCOUNTER — Other Ambulatory Visit: Payer: Self-pay

## 2019-09-15 ENCOUNTER — Inpatient Hospital Stay: Payer: Medicare Other

## 2019-09-15 VITALS — BP 106/55 | HR 77 | Temp 97.6°F | Resp 18 | Ht 59.0 in | Wt 118.9 lb

## 2019-09-15 DIAGNOSIS — D631 Anemia in chronic kidney disease: Secondary | ICD-10-CM

## 2019-09-15 DIAGNOSIS — N185 Chronic kidney disease, stage 5: Secondary | ICD-10-CM

## 2019-09-15 DIAGNOSIS — M8580 Other specified disorders of bone density and structure, unspecified site: Secondary | ICD-10-CM | POA: Insufficient documentation

## 2019-09-15 DIAGNOSIS — F17210 Nicotine dependence, cigarettes, uncomplicated: Secondary | ICD-10-CM | POA: Diagnosis not present

## 2019-09-15 DIAGNOSIS — Z7982 Long term (current) use of aspirin: Secondary | ICD-10-CM | POA: Diagnosis not present

## 2019-09-15 DIAGNOSIS — K219 Gastro-esophageal reflux disease without esophagitis: Secondary | ICD-10-CM | POA: Insufficient documentation

## 2019-09-15 DIAGNOSIS — Z7952 Long term (current) use of systemic steroids: Secondary | ICD-10-CM | POA: Diagnosis not present

## 2019-09-15 DIAGNOSIS — Z8673 Personal history of transient ischemic attack (TIA), and cerebral infarction without residual deficits: Secondary | ICD-10-CM | POA: Diagnosis not present

## 2019-09-15 DIAGNOSIS — Z940 Kidney transplant status: Secondary | ICD-10-CM | POA: Diagnosis not present

## 2019-09-15 DIAGNOSIS — I4891 Unspecified atrial fibrillation: Secondary | ICD-10-CM | POA: Insufficient documentation

## 2019-09-15 DIAGNOSIS — Z800 Family history of malignant neoplasm of digestive organs: Secondary | ICD-10-CM | POA: Diagnosis not present

## 2019-09-15 DIAGNOSIS — Z992 Dependence on renal dialysis: Secondary | ICD-10-CM | POA: Diagnosis not present

## 2019-09-15 DIAGNOSIS — Z79899 Other long term (current) drug therapy: Secondary | ICD-10-CM | POA: Diagnosis not present

## 2019-09-15 DIAGNOSIS — Z905 Acquired absence of kidney: Secondary | ICD-10-CM | POA: Insufficient documentation

## 2019-09-15 DIAGNOSIS — N186 End stage renal disease: Secondary | ICD-10-CM | POA: Diagnosis present

## 2019-09-15 DIAGNOSIS — D539 Nutritional anemia, unspecified: Secondary | ICD-10-CM

## 2019-09-15 DIAGNOSIS — Z8249 Family history of ischemic heart disease and other diseases of the circulatory system: Secondary | ICD-10-CM | POA: Insufficient documentation

## 2019-09-15 LAB — CBC WITH DIFFERENTIAL (CANCER CENTER ONLY)
Abs Immature Granulocytes: 0.04 10*3/uL (ref 0.00–0.07)
Basophils Absolute: 0 10*3/uL (ref 0.0–0.1)
Basophils Relative: 0 %
Eosinophils Absolute: 0 10*3/uL (ref 0.0–0.5)
Eosinophils Relative: 1 %
HCT: 30.7 % — ABNORMAL LOW (ref 36.0–46.0)
Hemoglobin: 9.3 g/dL — ABNORMAL LOW (ref 12.0–15.0)
Immature Granulocytes: 1 %
Lymphocytes Relative: 26 %
Lymphs Abs: 1.5 10*3/uL (ref 0.7–4.0)
MCH: 30.4 pg (ref 26.0–34.0)
MCHC: 30.3 g/dL (ref 30.0–36.0)
MCV: 100.3 fL — ABNORMAL HIGH (ref 80.0–100.0)
Monocytes Absolute: 0.6 10*3/uL (ref 0.1–1.0)
Monocytes Relative: 11 %
Neutro Abs: 3.5 10*3/uL (ref 1.7–7.7)
Neutrophils Relative %: 61 %
Platelet Count: 145 10*3/uL — ABNORMAL LOW (ref 150–400)
RBC: 3.06 MIL/uL — ABNORMAL LOW (ref 3.87–5.11)
RDW: 19.3 % — ABNORMAL HIGH (ref 11.5–15.5)
WBC Count: 5.7 10*3/uL (ref 4.0–10.5)
nRBC: 0 % (ref 0.0–0.2)

## 2019-09-15 LAB — CMP (CANCER CENTER ONLY)
ALT: 30 U/L (ref 0–44)
AST: 39 U/L (ref 15–41)
Albumin: 3.4 g/dL — ABNORMAL LOW (ref 3.5–5.0)
Alkaline Phosphatase: 215 U/L — ABNORMAL HIGH (ref 38–126)
Anion gap: 13 (ref 5–15)
BUN: 38 mg/dL — ABNORMAL HIGH (ref 6–20)
CO2: 24 mmol/L (ref 22–32)
Calcium: 10.3 mg/dL (ref 8.9–10.3)
Chloride: 101 mmol/L (ref 98–111)
Creatinine: 5.55 mg/dL (ref 0.44–1.00)
GFR, Est AFR Am: 9 mL/min — ABNORMAL LOW (ref >60)
GFR, Estimated: 8 mL/min — ABNORMAL LOW (ref >60)
Glucose, Bld: 69 mg/dL — ABNORMAL LOW (ref 70–99)
Potassium: 4.5 mmol/L (ref 3.5–5.1)
Sodium: 138 mmol/L (ref 135–145)
Total Bilirubin: 1 mg/dL (ref 0.3–1.2)
Total Protein: 6.4 g/dL — ABNORMAL LOW (ref 6.5–8.1)

## 2019-09-15 LAB — SAVE SMEAR(SSMR), FOR PROVIDER SLIDE REVIEW

## 2019-09-15 LAB — RETIC PANEL
Immature Retic Fract: 24.8 % — ABNORMAL HIGH (ref 2.3–15.9)
RBC.: 3.02 MIL/uL — ABNORMAL LOW (ref 3.87–5.11)
Retic Count, Absolute: 136.8 10*3/uL (ref 19.0–186.0)
Retic Ct Pct: 4.5 % — ABNORMAL HIGH (ref 0.4–3.1)
Reticulocyte Hemoglobin: 34.5 pg (ref >27.9)

## 2019-09-15 LAB — SAMPLE TO BLOOD BANK

## 2019-09-15 LAB — TSH: TSH: 2.469 u[IU]/mL (ref 0.308–3.960)

## 2019-09-15 LAB — DIRECT ANTIGLOBULIN TEST (NOT AT ARMC)
DAT, IgG: NEGATIVE
DAT, complement: NEGATIVE

## 2019-09-15 LAB — LACTATE DEHYDROGENASE: LDH: 239 U/L — ABNORMAL HIGH (ref 98–192)

## 2019-09-16 LAB — HAPTOGLOBIN: Haptoglobin: <10 mg/dL — ABNORMAL LOW (ref 33–346)

## 2019-09-18 ENCOUNTER — Ambulatory Visit: Payer: Medicaid Other | Admitting: Family Medicine

## 2019-09-18 ENCOUNTER — Telehealth: Payer: Self-pay | Admitting: Hematology and Oncology

## 2019-09-18 LAB — PROTEIN ELECTROPHORESIS, SERUM, WITH REFLEX
A/G Ratio: 1.3 (ref 0.7–1.7)
Albumin ELP: 3.4 g/dL (ref 2.9–4.4)
Alpha-1-Globulin: 0.4 g/dL (ref 0.0–0.4)
Alpha-2-Globulin: 0.5 g/dL (ref 0.4–1.0)
Beta Globulin: 0.9 g/dL (ref 0.7–1.3)
Gamma Globulin: 0.9 g/dL (ref 0.4–1.8)
Globulin, Total: 2.7 g/dL (ref 2.2–3.9)
Total Protein ELP: 6.1 g/dL (ref 6.0–8.5)

## 2019-09-18 LAB — KAPPA/LAMBDA LIGHT CHAINS
Kappa free light chain: 150 mg/L — ABNORMAL HIGH (ref 3.3–19.4)
Kappa, lambda light chain ratio: 1.72 — ABNORMAL HIGH (ref 0.26–1.65)
Lambda free light chains: 87.4 mg/L — ABNORMAL HIGH (ref 5.7–26.3)

## 2019-09-18 LAB — HOMOCYSTEINE: Homocysteine: 46.4 umol/L — ABNORMAL HIGH (ref 0.0–14.5)

## 2019-09-18 LAB — METHYLMALONIC ACID, SERUM: Methylmalonic Acid, Quantitative: 291 nmol/L (ref 0–378)

## 2019-09-18 LAB — ERYTHROPOIETIN: Erythropoietin: 1234 m[IU]/mL — ABNORMAL HIGH (ref 2.6–18.5)

## 2019-09-18 NOTE — Telephone Encounter (Signed)
Scheduled per los. Called and spoke with patient. Confirmed appt 

## 2019-09-19 ENCOUNTER — Ambulatory Visit (INDEPENDENT_AMBULATORY_CARE_PROVIDER_SITE_OTHER): Payer: Medicare Other | Admitting: Vascular Surgery

## 2019-09-19 ENCOUNTER — Encounter: Payer: Self-pay | Admitting: Vascular Surgery

## 2019-09-19 ENCOUNTER — Ambulatory Visit (INDEPENDENT_AMBULATORY_CARE_PROVIDER_SITE_OTHER): Admit: 2019-09-19 | Discharge: 2019-09-19 | Disposition: A | Discharge status: Home / Self Care | Payer: Medicare Other

## 2019-09-19 ENCOUNTER — Other Ambulatory Visit: Payer: Self-pay

## 2019-09-19 ENCOUNTER — Ambulatory Visit (HOSPITAL_COMMUNITY)
Admission: RE | Admit: 2019-09-19 | Discharge: 2019-09-19 | Disposition: A | Discharge status: Home / Self Care | Payer: Medicare Other | Source: Ambulatory Visit | Attending: Vascular Surgery | Admitting: Vascular Surgery

## 2019-09-19 VITALS — BP 89/57 | HR 63 | Temp 97.3°F | Resp 14 | Ht 59.0 in | Wt 110.0 lb

## 2019-09-19 DIAGNOSIS — N186 End stage renal disease: Secondary | ICD-10-CM | POA: Diagnosis not present

## 2019-09-19 LAB — COPPER, SERUM: Copper: 159 ug/dL — ABNORMAL HIGH (ref 80–158)

## 2019-09-19 NOTE — Progress Notes (Signed)
Patient name: Raylyn Carton MRN: [id number] DOB: 15-Apr-1965 Sex: female  REASON FOR VISIT: Hospital follow-up to discuss permanent dialysis access  HPI: Gisele Pack is a 54 y.o. female with extensive past medical history presents for hospital follow-up to discuss permanent dialysis access.  She was recently seen in the hospital with acute kidney injury on chronic disease stage IV now progressing to end-stage renal disease in the setting of previous kidney transplant and recent COVID-19 infection. She has a failed left forearm loop graft as well as the left upper arm graft that she states were placed in [place] and never worked.  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  We further evaluated femoral loop graft but appears IR also had trouble getting a wire in her IVC and suspected IVC stenosis versus occlusion.  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    In the hospital, I discussed with Dr. Posey Pronto possibly getting a CT venogram to evaluate her IVC prior to making any plans for lower extremity access but we agreed to delay this for 3 to 4 weeks and will keep her catheter dependent in the interim until we see how her kidney function recovers.   Follow-up today reports that she wants to discuss peritoneal dialysis and is really not interested in femoral access.  She understands that she likely has no options with her upper extremities given SVC stent occlusion with chronic calcification.   Past Medical History:  Diagnosis Date  . Bacteremia due to Gram-negative bacteria 05/23/2011  .  Blind    right eye  . Blind right eye   . CHF (congestive heart failure) ([place])   . Chronic lower back pain   . Complication of anesthesia    "woke up during OR; I have an extremely high tolerance" (12/11/2011) 1 procedure was graft; the other procedures were procedures that are typically done with sedation.  . DDD (degenerative disc disease), cervical   . Depression   . Dysrhythmia    "tachycardia" (12/11/2011) new onset afib 10/15/14 EKG  . E coli bacteremia 06/18/2011  . Elevated LDL cholesterol level 12/2018  . ESRD (end stage renal disease) ([place]) 06/12/2011  . Fibromyalgia   . Gastroparesis   . Gastroparesis   . GERD (gastroesophageal reflux disease)   . Glaucoma    right eye  . Gout   . H/O carpal tunnel syndrome   . Headache(784.0)    "not often anymore" (12/11/2011)  . Herpes genitalia 1994  . History of blood transfusion    "more than a few times" (12/11/2011)  . History of stomach ulcers   . Hypotension   . Iron deficiency anemia   . New onset a-fib ([place])    10/15/14 EKG  . Osteopenia   . Peripheral neuropathy 11/2018  . Pressure ulcer of sacral region, stage 1 07/2019  . Seizures ([place]) 1994   "post transplant; only have had that one" (12/11/2011)  . Spinal stenosis in cervical region   . Stroke [facility])     left basal ganglia lacunar infarct; Right frontal lobe lacunar infarct.  . Stroke ([place]) ~  1999; 2001   "briefly lost my vision; lost my right eye" (12/11/2011)  . Vitamin D deficiency 12/2018    Past Surgical History:  Procedure Laterality Date  . ANTERIOR CERVICAL DECOMP/DISCECTOMY FUSION N/A 01/08/2015   Procedure: Anterior Cervical Three-Four/Four-Five Decompression/Diskectomy/Fusion;  Surgeon: Leeroy Cha, MD;  Location: [place] NEURO ORS;  Service: Neurosurgery;  Laterality: N/A;  C3-4 C4-5 Anterior cervical decompression/diskectomy/fusion  . APPENDECTOMY  ~ 2004  . CATARACT EXTRACTION     right eye  . COLONOSCOPY    . ENUCLEATION  2001   "right"  .  ESOPHAGOGASTRODUODENOSCOPY (EGD) WITH PROPOFOL N/A 04/21/2012   Procedure: ESOPHAGOGASTRODUODENOSCOPY (EGD) WITH PROPOFOL;  Surgeon: Milus Banister, MD;  [facility];  Service: Endoscopy;  Laterality: N/A;  . 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 IR FLUORO GUIDE CV LINE RIGHT  07/11/2019  . IR RADIOLOGY PERIPHERAL GUIDED IV START  07/11/2019  . IR US GUIDE VASC ACCESS RIGHT  07/11/2019  . IR US GUIDE VASC ACCESS RIGHT  07/11/2019  . IR US GUIDE VASC ACCESS RIGHT  07/11/2019  . [place]; 1999; 2005   "right"  . MULTIPLE TOOTH EXTRACTIONS    . RIGHT HEART CATH N/A 03/23/2019   Procedure: RIGHT HEART CATH;  Surgeon: Jolaine Artist, MD;  Location: [place] CV LAB;  Service: Cardiovascular;  Laterality: N/A;  . TONSILLECTOMY    . TOTAL NEPHRECTOMY  1988?; 1994; 2005    Family History  Problem Relation Age of Onset  . Glaucoma Mother   . Pancreatic cancer Father   . Multiple sclerosis Brother   . Hypertension Maternal Grandmother   . Breast cancer Neg Hx     SOCIAL HISTORY: Social History   Tobacco Use  . Smoking status: Current Every Day Smoker    Packs/day: 0.50    Years: 35.00    Pack years: 17.50    Types: Cigarettes  . Smokeless tobacco: Never Used  . Tobacco comment: Patient stated she last smoked in February 2021, which was the first time she was hospitalized this year. She has been in and out of the hospital several times since then,   Substance Use Topics  . Alcohol use: Yes    Comment: rare    Allergies  Allergen Reactions  . Levofloxacin Itching and Rash  . Tape Other (See Comments)    "Certain surgical tapes peel off my skin"    Current Outpatient Medications  Medication Sig Dispense Refill  . allopurinol (ZYLOPRIM) 100 MG tablet Take 1 tablet (100 mg total) by mouth daily. 30 tablet 0  . aspirin EC 81 MG tablet Take 81 mg by mouth daily.     . colchicine 0.6 MG tablet  Take 0.5 tablets (0.3 mg total) by mouth daily. 30 tablet 0  . diphenhydrAMINE (BENADRYL) 25 mg capsule Take 1 capsule (25 mg total) by mouth every 8 (eight) hours as needed. (Patient taking differently: Take 25 mg by mouth every 8 (eight) hours as needed for itching. ) 30 capsule 6  . famotidine (PEPCID) 20 MG tablet Take 1 tablet (20 mg total) by mouth 2 (two) times daily. 60 tablet 11  . gabapentin (NEURONTIN) 100 MG capsule Take 100 mg by mouth 2 (two) times daily.     [name] HYDROmorphone (DILAUDID) 2 MG tablet Take 0.5 tablets (1 mg total) by mouth every 6 (six) hours as needed for severe pain. 10  tablet 0  . latanoprost (XALATAN) 0.005 % ophthalmic solution Place 1 drop into the left eye at bedtime.  3  . lidocaine (LIDODERM) 5 % Place 1 patch onto the skin daily as needed (for pain- remove old patch first).     . Methoxy PEG-Epoetin Beta (MIRCERA IJ) Mircera    . midodrine (PROAMATINE) 10 MG tablet Take 10 mg by mouth 3 (three) times daily.    . ondansetron (ZOFRAN-ODT) 4 MG disintegrating tablet Take 1 tablet (4 mg total) by mouth every 8 (eight) hours as needed for nausea or vomiting. 20 tablet 11  . predniSONE (DELTASONE) 5 MG tablet Take 1 tablet (5 mg total) by mouth daily with breakfast. 30 tablet 0  . tacrolimus (PROGRAF) 1 MG capsule Take 2-3 mg by mouth See admin instructions. Take 3 mg by mouth in the morning and 2 mg at bedtime    . Vitamin D, Ergocalciferol, (DRISDOL) 1.25 MG (50000 UNIT) CAPS capsule Take 50,000 Units by mouth every Sunday.     . zolpidem (AMBIEN) 10 MG tablet Take 1 tablet (10 mg total) by mouth at bedtime as needed for sleep. 10 tablet 0  . calcium acetate (PHOSLO) 667 MG capsule Take 1 capsule (667 mg total) by mouth 3 (three) times daily with meals. (Patient not taking: Reported on 09/19/2019) 90 capsule 0  . oxyCODONE-acetaminophen (PERCOCET) 10-325 MG tablet Take 1 tablet by mouth 4 (four) times daily as needed. (Patient not taking: Reported on 09/19/2019)     No  current facility-administered medications for this visit.    REVIEW OF SYSTEMS:  [X]  denotes positive finding, [ ]  denotes negative finding Cardiac  Comments:  Chest pain or chest pressure:    Shortness of breath upon exertion:    Short of breath when lying flat:    Irregular heart rhythm:        Vascular    Pain in calf, thigh, or hip brought on by ambulation:    Pain in feet at night that wakes you up from your sleep:     Blood clot in your veins:    Leg swelling:         Pulmonary    Oxygen at home:    Productive cough:     Wheezing:         Neurologic    Sudden weakness in arms or legs:     Sudden numbness in arms or legs:     Sudden onset of difficulty speaking or slurred speech:    Temporary loss of vision in one eye:     Problems with dizziness:         Gastrointestinal    Blood in stool:     Vomited blood:         Genitourinary    Burning when urinating:     Blood in urine:        Psychiatric    Major depression:         Hematologic    Bleeding problems:    Problems with blood clotting too easily:        Skin    Rashes or ulcers:        Constitutional    Fever or chills:      PHYSICAL EXAM: Vitals:   09/19/19 1517  BP: (!) 89/57  Pulse: 63  Resp: 14  Temp: (!) 97.3 F (36.3 C)  TempSrc: Temporal  SpO2: 99%  Weight: 110 lb (49.9 kg)  Height: 4\' 11"  (1.499 m)  GENERAL: The patient is a well-nourished female, in no acute distress. The vital signs are documented above. CARDIAC: There is a regular rate and rhythm.  VASCULAR:  No palpable pedal pulses Right groin tunneled catheter for dialysis PULMONARY: There is good air exchange bilaterally without wheezing or rales. ABDOMEN: Soft and non-tender with normal pitched bowel sounds.  MUSCULOSKELETAL: There are no major deformities or cyanosis. NEUROLOGIC: No focal weakness or paresthesias are detected. SKIN: There are no ulcers or rashes noted. PSYCHIATRIC: The patient has a normal  affect.  DATA:   Upper extremity arterial duplex shows monophasic waveforms of both upper extremities.  She does have a reasonable sized basilic vein in the right arm but there is known central venous occlusion and partial thrombus in the upper arm basilic vein.  Assessment/Plan:  54 year old female with ESRD status post kidney transplant that had prolonged hospitalization from Covid pneumonia with AKI on CKD and progressing back to ESRD with failing kidney transplant.  Now using a right groin tunneled catheter.   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  In reviewing the old notes she has had multiple IVC interventions by our IR as far back as 2005.      Discussed with her today I would want to obtain a CT venogram of her abdomen pelvis to evaluate for this IVC stenosis versus occlusion before any decision about a femoral loop graft.  I certainly think this could be risky given she has no palpable pedal pulses in the foot and likely has underlying peripheral vascular disease as well as concern for outflow disease in the IVC.    She is pretty adamant about being evaluated for peritoneal dialysis which is very reasonable given limited options moving forward.  Ultimately will allow her to follow-up as needed pending her evaluation for PD dialysis.  The other option I discussed with her is that she remain catheter dependent.  She would like to further discuss with her nephrologist.  We will be available as needed.  Marty Heck, MD [facility] Office: [phone]

## 2019-09-20 DIAGNOSIS — Z8673 Personal history of transient ischemic attack (TIA), and cerebral infarction without residual deficits: Secondary | ICD-10-CM | POA: Diagnosis not present

## 2019-09-20 DIAGNOSIS — M109 Gout, unspecified: Secondary | ICD-10-CM | POA: Diagnosis not present

## 2019-09-20 DIAGNOSIS — D631 Anemia in chronic kidney disease: Secondary | ICD-10-CM | POA: Diagnosis not present

## 2019-09-20 DIAGNOSIS — I5032 Chronic diastolic (congestive) heart failure: Secondary | ICD-10-CM | POA: Diagnosis not present

## 2019-09-20 DIAGNOSIS — M5090 Cervical disc disorder, unspecified, unspecified cervical region: Secondary | ICD-10-CM | POA: Diagnosis not present

## 2019-09-20 DIAGNOSIS — Z992 Dependence on renal dialysis: Secondary | ICD-10-CM | POA: Diagnosis not present

## 2019-09-20 DIAGNOSIS — K219 Gastro-esophageal reflux disease without esophagitis: Secondary | ICD-10-CM | POA: Diagnosis not present

## 2019-09-20 DIAGNOSIS — F419 Anxiety disorder, unspecified: Secondary | ICD-10-CM | POA: Diagnosis not present

## 2019-09-20 DIAGNOSIS — N186 End stage renal disease: Secondary | ICD-10-CM | POA: Diagnosis not present

## 2019-09-20 DIAGNOSIS — D696 Thrombocytopenia, unspecified: Secondary | ICD-10-CM | POA: Diagnosis not present

## 2019-09-20 DIAGNOSIS — Z8616 Personal history of COVID-19: Secondary | ICD-10-CM | POA: Diagnosis not present

## 2019-09-20 DIAGNOSIS — I959 Hypotension, unspecified: Secondary | ICD-10-CM | POA: Diagnosis not present

## 2019-09-27 DIAGNOSIS — I959 Hypotension, unspecified: Secondary | ICD-10-CM | POA: Diagnosis not present

## 2019-09-27 DIAGNOSIS — M5090 Cervical disc disorder, unspecified, unspecified cervical region: Secondary | ICD-10-CM | POA: Diagnosis not present

## 2019-09-27 DIAGNOSIS — D696 Thrombocytopenia, unspecified: Secondary | ICD-10-CM | POA: Diagnosis not present

## 2019-09-27 DIAGNOSIS — Z8616 Personal history of COVID-19: Secondary | ICD-10-CM | POA: Diagnosis not present

## 2019-09-27 DIAGNOSIS — M109 Gout, unspecified: Secondary | ICD-10-CM | POA: Diagnosis not present

## 2019-09-27 DIAGNOSIS — D631 Anemia in chronic kidney disease: Secondary | ICD-10-CM | POA: Diagnosis not present

## 2019-09-27 DIAGNOSIS — F419 Anxiety disorder, unspecified: Secondary | ICD-10-CM | POA: Diagnosis not present

## 2019-09-27 DIAGNOSIS — I5032 Chronic diastolic (congestive) heart failure: Secondary | ICD-10-CM | POA: Diagnosis not present

## 2019-09-27 DIAGNOSIS — Z992 Dependence on renal dialysis: Secondary | ICD-10-CM | POA: Diagnosis not present

## 2019-09-27 DIAGNOSIS — Z8673 Personal history of transient ischemic attack (TIA), and cerebral infarction without residual deficits: Secondary | ICD-10-CM | POA: Diagnosis not present

## 2019-09-27 DIAGNOSIS — N186 End stage renal disease: Secondary | ICD-10-CM | POA: Diagnosis not present

## 2019-09-27 DIAGNOSIS — K219 Gastro-esophageal reflux disease without esophagitis: Secondary | ICD-10-CM | POA: Diagnosis not present

## 2019-09-27 NOTE — Progress Notes (Deleted)
Cardiology Office Note:   Date:  09/27/2019  NAME:  Tina Watkins    MRN: [id number] DOB:  Jul 05, 1965   PCP:  [name], FNP  Cardiologist:  Evalina Field, MD  Electrophysiologist:  None   Referring MD: [name], FNP   No chief complaint on file. ***  History of Present Illness:   Tina Watkins is a 54 y.o. female with a hx of ESRD s/p renal transplant c/b CKD in transplant, HTN, TIA who presents for follow-up of HFpEF. Now back on HD. Now in SNF. Anemia is an issue.   Problem List 1. 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 HFpEF 60-65% Grade 3DD 3. TIA 4. HLD 5. HLD 6. Tobacco abuse 7. HTN  Past Medical History: Past Medical History:  Diagnosis Date  . Bacteremia due to Gram-negative bacteria 05/23/2011  . Blind    right eye  . Blind right eye   . CHF (congestive heart failure) ([place])   . Chronic lower back pain   . Complication of anesthesia    "woke up during OR; I have an extremely high tolerance" (12/11/2011) 1 procedure was graft; the other procedures were procedures that are typically done with sedation.  . DDD (degenerative disc disease), cervical   . Depression   . Dysrhythmia    "tachycardia" (12/11/2011) new onset afib 10/15/14 EKG  . E coli bacteremia 06/18/2011  . Elevated LDL cholesterol level 12/2018  . ESRD (end stage renal disease) ([place]) 06/12/2011  . Fibromyalgia   . Gastroparesis   . Gastroparesis   . GERD (gastroesophageal reflux disease)   . Glaucoma    right eye  . Gout   . H/O carpal tunnel syndrome   . Headache(784.0)    "not often anymore" (12/11/2011)  . Herpes genitalia 1994  . History of blood transfusion    "more than a few times" (12/11/2011)  . History of stomach ulcers   . Hypotension   . Iron deficiency anemia   . New onset a-fib ([place])    10/15/14 EKG  . Osteopenia   . Peripheral neuropathy 11/2018  . Pressure ulcer of sacral  region, stage 1 07/2019  . Seizures ([place]) 1994   "post transplant; only have had that one" (12/11/2011)  . Spinal stenosis in cervical region   . Stroke [facility])     left basal ganglia lacunar infarct; Right frontal lobe lacunar infarct.  . Stroke [facility]) ~ 1999; 2001   "briefly lost my vision; lost my right eye" (12/11/2011)  . Vitamin D deficiency 12/2018    Past Surgical History: Past Surgical History:  Procedure Laterality Date  . ANTERIOR CERVICAL DECOMP/DISCECTOMY FUSION N/A 01/08/2015   Procedure: Anterior Cervical Three-Four/Four-Five Decompression/Diskectomy/Fusion;  Surgeon: Leeroy Cha, MD;  Location: [place] NEURO ORS;  Service: Neurosurgery;  Laterality: N/A;  C3-4 C4-5 Anterior cervical decompression/diskectomy/fusion  . APPENDECTOMY  ~ 2004  . CATARACT EXTRACTION     right eye  . COLONOSCOPY    . ENUCLEATION  2001   "right"  . ESOPHAGOGASTRODUODENOSCOPY (EGD) WITH PROPOFOL N/A 04/21/2012   Procedure: ESOPHAGOGASTRODUODENOSCOPY (EGD) WITH PROPOFOL;  Surgeon: Milus Banister, MD;  [facility];  Service: Endoscopy;  Laterality: N/A;  . 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 IR FLUORO GUIDE CV LINE RIGHT  07/11/2019  . IR RADIOLOGY PERIPHERAL GUIDED IV START  07/11/2019  .  IR US GUIDE VASC ACCESS RIGHT  07/11/2019  . IR US GUIDE VASC ACCESS RIGHT  07/11/2019  . IR US GUIDE VASC ACCESS RIGHT  07/11/2019  . [place]; 1999; 2005   "right"  . MULTIPLE TOOTH EXTRACTIONS    . RIGHT HEART CATH N/A 03/23/2019   Procedure: RIGHT HEART CATH;  Surgeon: Jolaine Artist, MD;  Location: [place] CV LAB;  Service: Cardiovascular;  Laterality: N/A;  . TONSILLECTOMY    . TOTAL NEPHRECTOMY  1988?; 1994; 2005    Current Medications: No outpatient medications have been marked as taking for the 09/28/19 encounter (Appointment) with O'Neal, Cassie Freer, MD.     Allergies:    Levofloxacin and Tape   Social  History: Social History   Socioeconomic History  . Marital status: Single    Spouse name: Not on file  . Number of children: 0  . Years of education: some college  . Highest education level: Not on file  Occupational History  . Occupation: disabled    Fish farm manager: UNEMPLOYED  Tobacco Use  . Smoking status: Current Every Day Smoker    Packs/day: 0.50    Years: 35.00    Pack years: 17.50    Types: Cigarettes  . Smokeless tobacco: Never Used  . Tobacco comment: Patient stated she last smoked in February 2021, which was the first time she was hospitalized this year. She has been in and out of the hospital several times since then,   Vaping Use  . Vaping Use: Never used  Substance and Sexual Activity  . Alcohol use: Yes    Comment: rare  . Drug use: Yes    Types: Marijuana    Comment: occasional use  . Sexual activity: Not Currently  Other Topics Concern  . Not on file  Social History Narrative   Right-handed.   Four cups caffeine per day.   Lives alone.   Social Determinants of Health   Financial Resource Strain:   . Difficulty of Paying Living Expenses: Not on file  Food Insecurity:   . Worried About Charity fundraiser in the Last Year: Not on file  . Ran Out of Food in the Last Year: Not on file  Transportation Needs:   . Lack of Transportation (Medical): Not on file  . Lack of Transportation (Non-Medical): Not on file  Physical Activity:   . Days of Exercise per Week: Not on file  . Minutes of Exercise per Session: Not on file  Stress:   . Feeling of Stress : Not on file  Social Connections:   . Frequency of Communication with Friends and Family: Not on file  . Frequency of Social Gatherings with Friends and Family: Not on file  . Attends Religious Services: Not on file  . Active Member of Clubs or Organizations: Not on file  . Attends Archivist Meetings: Not on file  . Marital Status: Not on file     Family History: The patient's ***family history  includes Glaucoma in her mother; Hypertension in her maternal grandmother; Multiple sclerosis in her brother; Pancreatic cancer in her father. There is no history of Breast cancer.  ROS:   All other ROS reviewed and negative. Pertinent positives noted in the HPI.     EKGs/Labs/Other Studies Reviewed:   The following studies were personally reviewed by me today:  EKG:  EKG is *** ordered today.  The ekg ordered today demonstrates ***, and was personally reviewed by me.  TTE 01/25/2020 1. Left ventricular ejection fraction, by visual estimation, is 60 to  65%. The left ventricle has normal function. There is moderately increased  left ventricular hypertrophy.  2. Left ventricular diastolic parameters are consistent with Grade III  diastolic dysfunction (restrictive).  3. Elevated left ventricular end-diastolic pressure.  4. Global right ventricle has normal systolic function.The right  ventricular size is normal. No increase in right ventricular wall  thickness.  5. Moderately elevated pulmonary artery systolic pressure.  6. The tricuspid regurgitant velocity is 3.22 m/s, and with an assumed  right atrial pressure of 3 mmHg, the estimated right ventricular systolic  pressure is moderately elevated at 44.6 mmHg.  7. Left atrial size was normal.  8. Right atrial size was normal.  9. Trivial pericardial effusion is present. No findings to suggest  constrictive physiology.  10. The mitral valve is normal in structure. Trivial mitral valve  regurgitation. No evidence of mitral stenosis.  11. The tricuspid valve is normal in structure. Moderate TR.  12. The aortic valve has an indeterminant number of cusps. Aortic valve  regurgitation is not visualized. Mild aortic valve sclerosis without  stenosis.  13. The pulmonic valve was normal in structure. Pulmonic valve  regurgitation is trivial.  14. The inferior vena cava is normal in size with greater than 50%  respiratory  variability, suggesting right atrial pressure of 3 mmHg.  15. The left ventricle has no regional wall motion abnormalities.   Recent Labs: 07/07/2019: B Natriuretic Peptide 474.1 09/15/2019: ALT 30; BUN 38; Creatinine 5.55; Hemoglobin 9.3; Platelet Count 145; Potassium 4.5; Sodium 138; TSH 2.469   Recent Lipid Panel    Component Value Date/Time   CHOL 214 (H) 12/06/2018 1052   TRIG 118 12/06/2018 1052   HDL 89 12/06/2018 1052   CHOLHDL 2.4 12/06/2018 1052   CHOLHDL 2.9 08/23/2008 0604   VLDL 21 08/23/2008 0604   LDLCALC 105 (H) 12/06/2018 1052    Physical Exam:   VS:  There were no vitals taken for this visit.   Wt Readings from Last 3 Encounters:  09/19/19 110 lb (49.9 kg)  09/15/19 118 lb 14.4 oz (53.9 kg)  09/03/19 112 lb (50.8 kg)    General: Well nourished, well developed, in no acute distress Heart: Atraumatic, normal size  Eyes: PEERLA, EOMI  Neck: Supple, no JVD Endocrine: No thryomegaly Cardiac: Normal S1, S2; RRR; no murmurs, rubs, or gallops Lungs: Clear to auscultation bilaterally, no wheezing, rhonchi or rales  Abd: Soft, nontender, no hepatomegaly  Ext: No edema, pulses 2+ Musculoskeletal: No deformities, BUE and BLE strength normal and equal Skin: Warm and dry, no rashes   Neuro: Alert and oriented to person, place, time, and situation, CNII-XII grossly intact, no focal deficits  Psych: Normal mood and affect   ASSESSMENT:   Tina Watkins is a 54 y.o. female who presents for the following: No diagnosis found.  PLAN:   There are no diagnoses linked to this encounter.  Disposition: No follow-ups on file.  Medication Adjustments/Labs and Tests Ordered: Current medicines are reviewed at length with the patient today.  Concerns regarding medicines are outlined above.  No orders of the defined types were placed in this encounter.  No orders of the defined types were placed in this encounter.   There are no Patient Instructions on file for this  visit.   Time Spent with Patient: I have spent a total of *** minutes with patient reviewing hospital notes, telemetry, EKGs, labs and examining the  patient as well as establishing an assessment and plan that was discussed with the patient.  > 50% of time was spent in direct patient care.  Signed, Addison Naegeli. Audie Box, [place]  [address], [place], [place] [address] [phone]  09/27/2019 7:18 AM

## 2019-09-28 ENCOUNTER — Ambulatory Visit: Payer: Medicaid Other | Admitting: Cardiovascular Disease

## 2019-10-03 DIAGNOSIS — Z992 Dependence on renal dialysis: Secondary | ICD-10-CM | POA: Diagnosis not present

## 2019-10-03 DIAGNOSIS — T8612 Kidney transplant failure: Secondary | ICD-10-CM | POA: Diagnosis not present

## 2019-10-03 DIAGNOSIS — N186 End stage renal disease: Secondary | ICD-10-CM | POA: Diagnosis not present

## 2019-10-04 DIAGNOSIS — R519 Headache, unspecified: Secondary | ICD-10-CM

## 2019-10-04 HISTORY — DX: Headache, unspecified: R51.9

## 2019-10-05 IMAGING — DX DG CHEST 2V
2 series · 2 of 2 positions shown · non-contrast
Comparison: 06/05/2017

CLINICAL DATA: Cough and congestion.

EXAM:
CHEST - 2 VIEW

[chest pa]
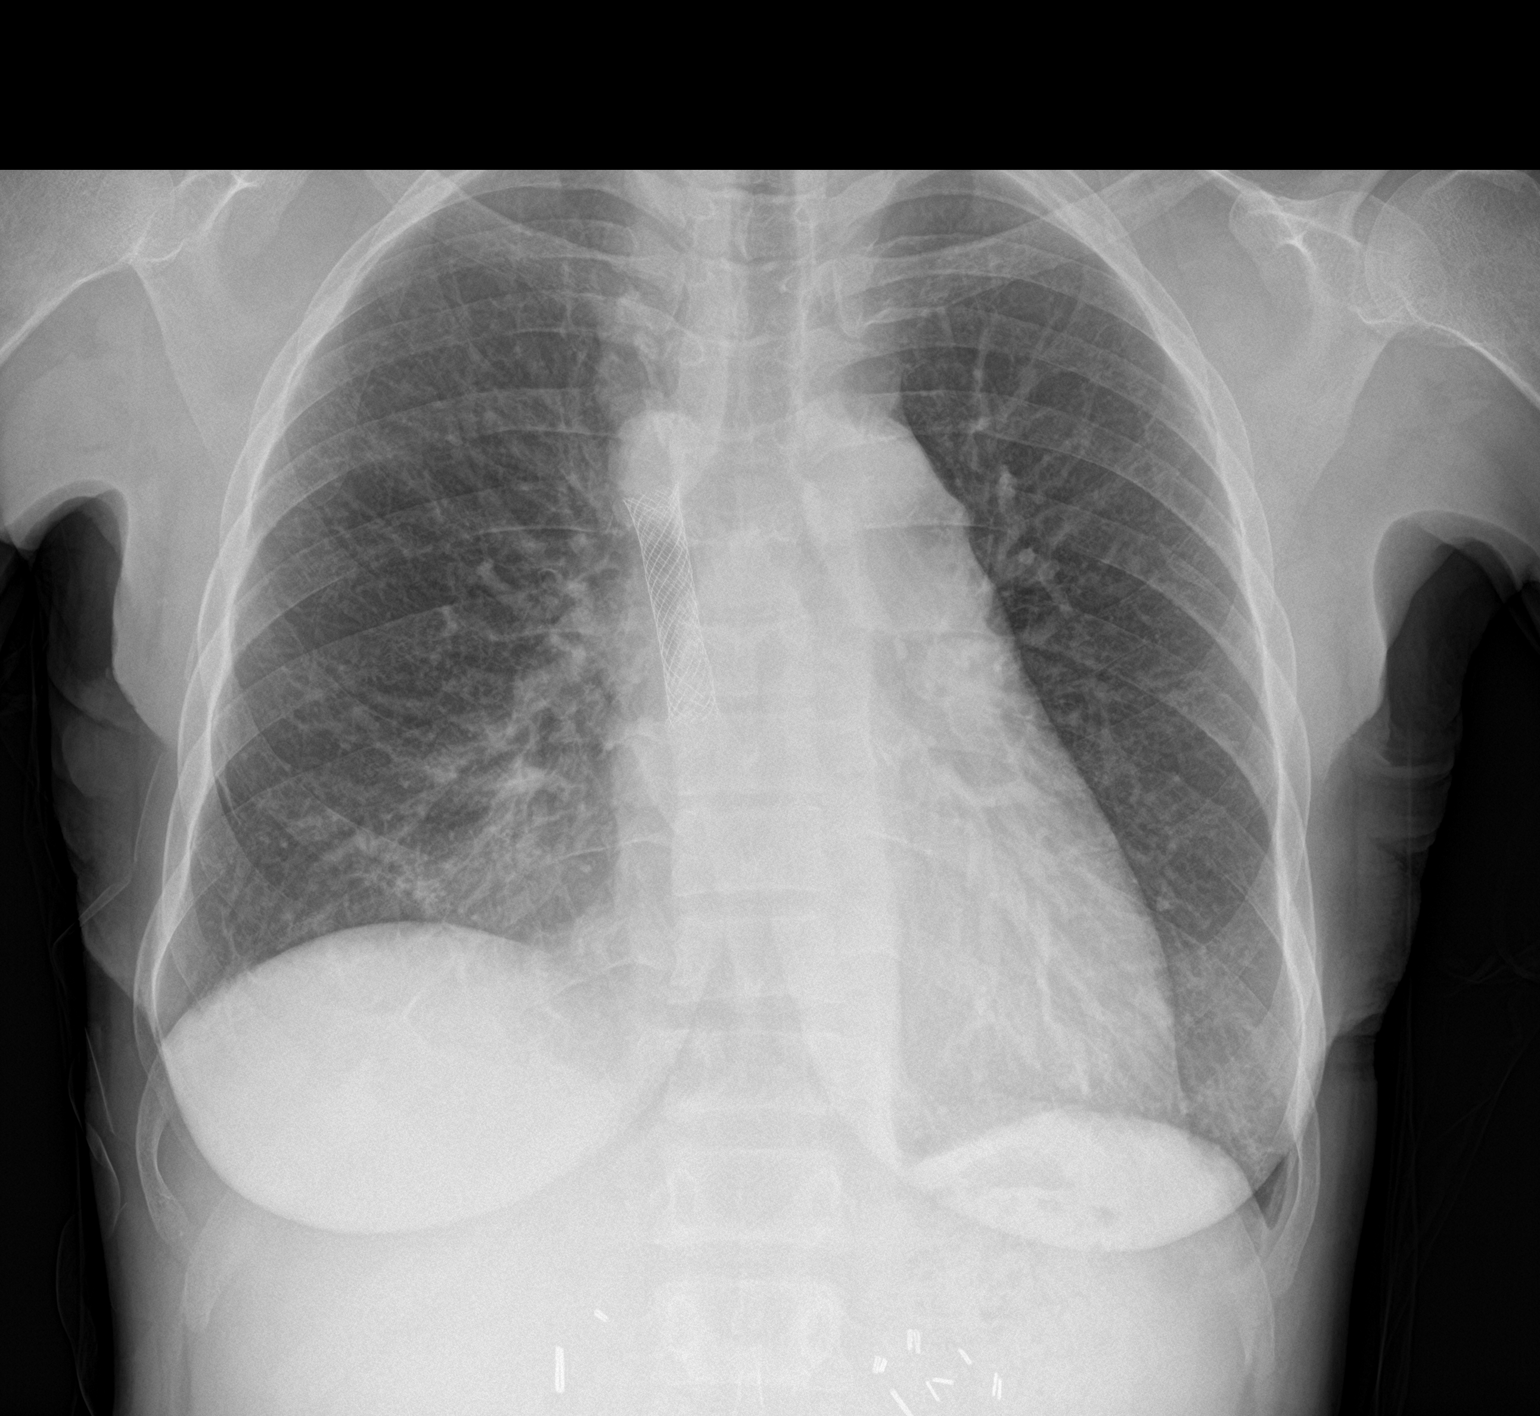

[chest lat]
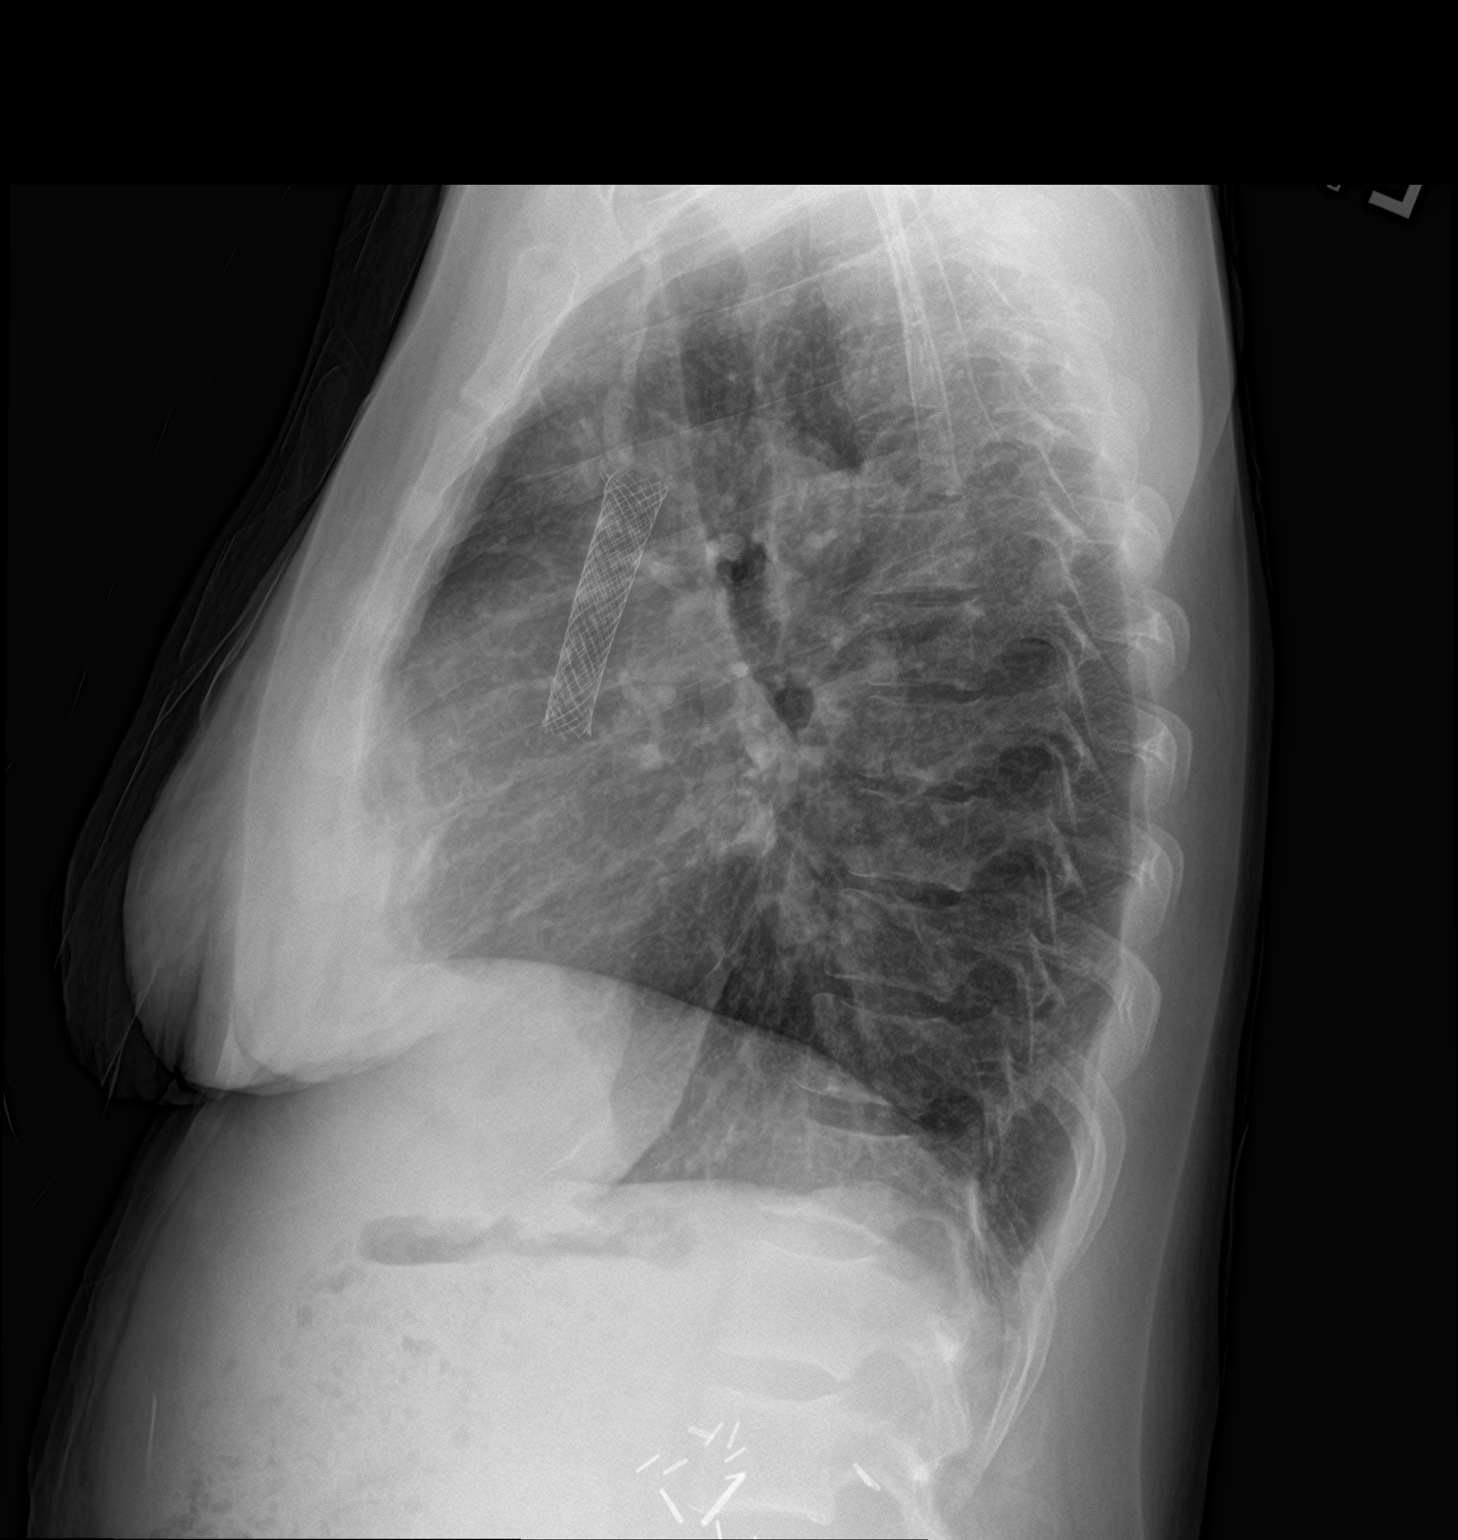

[2 of 2 positions shown; findings below may reference images not displayed]

FINDINGS: Mild cardiac enlargement. A stent is identified within the superior
vena cava. No pleural effusion or edema identified. No airspace
opacities identified.
IMPRESSION: 1. No acute cardiopulmonary abnormalities.

## 2019-10-06 DIAGNOSIS — G8929 Other chronic pain: Secondary | ICD-10-CM | POA: Diagnosis not present

## 2019-10-06 DIAGNOSIS — Z79899 Other long term (current) drug therapy: Secondary | ICD-10-CM | POA: Diagnosis not present

## 2019-10-06 DIAGNOSIS — M542 Cervicalgia: Secondary | ICD-10-CM | POA: Diagnosis not present

## 2019-10-06 DIAGNOSIS — E782 Mixed hyperlipidemia: Secondary | ICD-10-CM | POA: Diagnosis not present

## 2019-10-06 DIAGNOSIS — M549 Dorsalgia, unspecified: Secondary | ICD-10-CM | POA: Diagnosis not present

## 2019-10-12 DIAGNOSIS — N186 End stage renal disease: Secondary | ICD-10-CM | POA: Diagnosis not present

## 2019-10-13 ENCOUNTER — Other Ambulatory Visit: Payer: Self-pay | Admitting: Hematology and Oncology

## 2019-10-13 ENCOUNTER — Inpatient Hospital Stay: Payer: Medicare Other

## 2019-10-13 ENCOUNTER — Other Ambulatory Visit: Payer: Self-pay

## 2019-10-13 ENCOUNTER — Inpatient Hospital Stay: Payer: Medicare Other | Attending: Hematology and Oncology | Admitting: Hematology and Oncology

## 2019-10-13 VITALS — BP 98/54 | HR 65 | Temp 96.7°F | Resp 18 | Ht 59.0 in | Wt 111.3 lb

## 2019-10-13 DIAGNOSIS — I4891 Unspecified atrial fibrillation: Secondary | ICD-10-CM | POA: Diagnosis not present

## 2019-10-13 DIAGNOSIS — Z800 Family history of malignant neoplasm of digestive organs: Secondary | ICD-10-CM | POA: Diagnosis not present

## 2019-10-13 DIAGNOSIS — D631 Anemia in chronic kidney disease: Secondary | ICD-10-CM

## 2019-10-13 DIAGNOSIS — F17210 Nicotine dependence, cigarettes, uncomplicated: Secondary | ICD-10-CM | POA: Insufficient documentation

## 2019-10-13 DIAGNOSIS — Z8249 Family history of ischemic heart disease and other diseases of the circulatory system: Secondary | ICD-10-CM | POA: Insufficient documentation

## 2019-10-13 DIAGNOSIS — N186 End stage renal disease: Secondary | ICD-10-CM

## 2019-10-13 DIAGNOSIS — Z940 Kidney transplant status: Secondary | ICD-10-CM | POA: Diagnosis not present

## 2019-10-13 DIAGNOSIS — Z7982 Long term (current) use of aspirin: Secondary | ICD-10-CM | POA: Diagnosis not present

## 2019-10-13 DIAGNOSIS — Z79899 Other long term (current) drug therapy: Secondary | ICD-10-CM | POA: Insufficient documentation

## 2019-10-13 DIAGNOSIS — Z8673 Personal history of transient ischemic attack (TIA), and cerebral infarction without residual deficits: Secondary | ICD-10-CM | POA: Insufficient documentation

## 2019-10-13 DIAGNOSIS — K219 Gastro-esophageal reflux disease without esophagitis: Secondary | ICD-10-CM | POA: Insufficient documentation

## 2019-10-13 DIAGNOSIS — M8580 Other specified disorders of bone density and structure, unspecified site: Secondary | ICD-10-CM | POA: Insufficient documentation

## 2019-10-13 DIAGNOSIS — Z905 Acquired absence of kidney: Secondary | ICD-10-CM | POA: Diagnosis not present

## 2019-10-13 DIAGNOSIS — I509 Heart failure, unspecified: Secondary | ICD-10-CM | POA: Diagnosis not present

## 2019-10-13 DIAGNOSIS — N185 Chronic kidney disease, stage 5: Secondary | ICD-10-CM | POA: Diagnosis not present

## 2019-10-13 LAB — CBC WITH DIFFERENTIAL (CANCER CENTER ONLY)
Abs Immature Granulocytes: 0.03 10*3/uL (ref 0.00–0.07)
Basophils Absolute: 0 10*3/uL (ref 0.0–0.1)
Basophils Relative: 0 %
Eosinophils Absolute: 0.1 10*3/uL (ref 0.0–0.5)
Eosinophils Relative: 1 %
HCT: 33.5 % — ABNORMAL LOW (ref 36.0–46.0)
Hemoglobin: 10.1 g/dL — ABNORMAL LOW (ref 12.0–15.0)
Immature Granulocytes: 1 %
Lymphocytes Relative: 29 %
Lymphs Abs: 1.6 10*3/uL (ref 0.7–4.0)
MCH: 27.3 pg (ref 26.0–34.0)
MCHC: 30.1 g/dL (ref 30.0–36.0)
MCV: 90.5 fL (ref 80.0–100.0)
Monocytes Absolute: 0.5 10*3/uL (ref 0.1–1.0)
Monocytes Relative: 9 %
Neutro Abs: 3.2 10*3/uL (ref 1.7–7.7)
Neutrophils Relative %: 60 %
Platelet Count: 170 10*3/uL (ref 150–400)
RBC: 3.7 MIL/uL — ABNORMAL LOW (ref 3.87–5.11)
RDW: 18.5 % — ABNORMAL HIGH (ref 11.5–15.5)
WBC Count: 5.3 10*3/uL (ref 4.0–10.5)
nRBC: 0 % (ref 0.0–0.2)

## 2019-10-13 LAB — CMP (CANCER CENTER ONLY)
ALT: 34 U/L (ref 0–44)
AST: 44 U/L — ABNORMAL HIGH (ref 15–41)
Albumin: 3.6 g/dL (ref 3.5–5.0)
Alkaline Phosphatase: 247 U/L — ABNORMAL HIGH (ref 38–126)
Anion gap: 8 (ref 5–15)
BUN: 26 mg/dL — ABNORMAL HIGH (ref 6–20)
CO2: 33 mmol/L — ABNORMAL HIGH (ref 22–32)
Calcium: 9.9 mg/dL (ref 8.9–10.3)
Chloride: 97 mmol/L — ABNORMAL LOW (ref 98–111)
Creatinine: 4.3 mg/dL (ref 0.44–1.00)
GFR, Est AFR Am: 13 mL/min — ABNORMAL LOW (ref >60)
GFR, Estimated: 11 mL/min — ABNORMAL LOW (ref >60)
Glucose, Bld: 91 mg/dL (ref 70–99)
Potassium: 4.9 mmol/L (ref 3.5–5.1)
Sodium: 138 mmol/L (ref 135–145)
Total Bilirubin: 0.7 mg/dL (ref 0.3–1.2)
Total Protein: 7 g/dL (ref 6.5–8.1)

## 2019-10-13 LAB — RETIC PANEL
Immature Retic Fract: 7.9 % (ref 2.3–15.9)
RBC.: 3.78 MIL/uL — ABNORMAL LOW (ref 3.87–5.11)
Retic Count, Absolute: 97.5 10*3/uL (ref 19.0–186.0)
Retic Ct Pct: 2.6 % (ref 0.4–3.1)
Reticulocyte Hemoglobin: 29.3 pg (ref >27.9)

## 2019-10-13 NOTE — Progress Notes (Signed)
[place] [phone]   [phone]  PROGRESS NOTE  Patient Care Team: [name], FNP as PCP - General (Family Medicine) O'Neal, Cassie Freer, MD as PCP - Cardiology (Cardiology) Netta Neat, LCSW as Social Worker (Licensed Clinical Social Worker)  Hematological/Oncological History #Macrocytic Anemia 1) 08/25/2018: WBC 4.0, Hgb 9.7, MCV 101.6, Plt 151 2) 05/18/2019: WBC 3.2, Hgb 8.1. ,MCV 100.8, Plt 149 3) 05/09/2019-09/01/2019: patient received 7 transfusions for Hgb <7.0 3)  09/03/2019: WBC 5.8, Hgb 8.7, Plt 97, MCV 100.4 4) 09/15/2019: establish care with Dr. Lorenso Courier   Interval History:  Tina Watkins 54 y.o. female with medical history significant for macrocytic anemia in the setting of ESRD who presents for a follow up visit. The patient's last visit was on 09/15/2019 at which time she established care. In the interim since the last visit she has had no major changes in her health.  On exam today Tina Watkins that she has been doing "okay" since her last visit.  She reports that she has "good days and bad days".  She reports that she has not noticed any difference in her energy levels despite the fact that her hemoglobin is elevated today.  She says that she is so used to low energy because of her longstanding ESRD.  She notes that she does not get short of breath at baseline, though on exertion she does occasionally have issues with shortness of breath.  She otherwise denies having any fevers, chills, sweats, nausea, vomiting or diarrhea.  She denies having any bleeding, bruising, or dark stools.  A full 10 point ROS is listed below.  MEDICAL HISTORY:  Past Medical History:  Diagnosis Date  . Bacteremia due to Gram-negative bacteria 05/23/2011  . Blind    right eye  . Blind right eye   . CHF (congestive heart failure) ([place])   . Chronic lower back pain   . Complication of anesthesia    "woke up during OR; I have an extremely  high tolerance" (12/11/2011) 1 procedure was graft; the other procedures were procedures that are typically done with sedation.  . DDD (degenerative disc disease), cervical   . Depression   . Dysrhythmia    "tachycardia" (12/11/2011) new onset afib 10/15/14 EKG  . E coli bacteremia 06/18/2011  . Elevated LDL cholesterol level 12/2018  . ESRD (end stage renal disease) ([place]) 06/12/2011  . Fibromyalgia   . Gastroparesis   . Gastroparesis   . GERD (gastroesophageal reflux disease)   . Glaucoma    right eye  . Gout   . H/O carpal tunnel syndrome   . Headache(784.0)    "not often anymore" (12/11/2011)  . Herpes genitalia 1994  . History of blood transfusion    "more than a few times" (12/11/2011)  . History of stomach ulcers   . Hypotension   . Iron deficiency anemia   . New onset a-fib ([place])    10/15/14 EKG  . Osteopenia   . Peripheral neuropathy 11/2018  . Pressure ulcer of sacral region, stage 1 07/2019  . Seizures ([place]) 1994   "post transplant; only have had that one" (12/11/2011)  . Spinal stenosis in cervical region   . Stroke [facility])     left basal ganglia lacunar infarct; Right frontal lobe lacunar infarct.  . Stroke [facility]) ~ 1999; 2001   "briefly lost my vision; lost my right eye" (12/11/2011)  . Vitamin D deficiency 12/2018    SURGICAL HISTORY: Past Surgical History:  Procedure Laterality  Date  . ANTERIOR CERVICAL DECOMP/DISCECTOMY FUSION N/A 01/08/2015   Procedure: Anterior Cervical Three-Four/Four-Five Decompression/Diskectomy/Fusion;  Surgeon: Leeroy Cha, MD;  Location: [facility];  Service: Neurosurgery;  Laterality: N/A;  C3-4 C4-5 Anterior cervical decompression/diskectomy/fusion  . APPENDECTOMY  ~ 2004  . CATARACT EXTRACTION     right eye  . COLONOSCOPY    . ENUCLEATION  2001   "right"  . ESOPHAGOGASTRODUODENOSCOPY (EGD) WITH PROPOFOL N/A 04/21/2012   Procedure: ESOPHAGOGASTRODUODENOSCOPY (EGD) WITH PROPOFOL;  Surgeon: Milus Banister, MD;  Location: [facility]  ENDOSCOPY;  Service: Endoscopy;  Laterality: N/A;  . 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 IR FLUORO GUIDE CV LINE RIGHT  07/11/2019  . IR RADIOLOGY PERIPHERAL GUIDED IV START  07/11/2019  . IR US GUIDE VASC ACCESS RIGHT  07/11/2019  . IR US GUIDE VASC ACCESS RIGHT  07/11/2019  . IR US GUIDE VASC ACCESS RIGHT  07/11/2019  . [place]; 1999; 2005   "right"  . MULTIPLE TOOTH EXTRACTIONS    . RIGHT HEART CATH N/A 03/23/2019   Procedure: RIGHT HEART CATH;  Surgeon: Jolaine Artist, MD;  Location: [facility] CV LAB;  Service: Cardiovascular;  Laterality: N/A;  . TONSILLECTOMY    . TOTAL NEPHRECTOMY  1988?; 1994; 2005    SOCIAL HISTORY: Social History   Socioeconomic History  . Marital status: Single    Spouse name: Not on file  . Number of children: 0  . Years of education: some college  . Highest education level: Not on file  Occupational History  . Occupation: disabled    Fish farm manager: UNEMPLOYED  Tobacco Use  . Smoking status: Current Every Day Smoker    Packs/day: 0.50    Years: 35.00    Pack years: 17.50    Types: Cigarettes  . Smokeless tobacco: Never Used  . Tobacco comment: Patient stated she last smoked in February 2021, which was the first time she was hospitalized this year. She has been in and out of the hospital several times since then,   Vaping Use  . Vaping Use: Never used  Substance and Sexual Activity  . Alcohol use: Yes    Comment: rare  . Drug use: Yes    Types: Marijuana    Comment: occasional use  . Sexual activity: Not Currently  Other Topics Concern  . Not on file  Social History Narrative   Right-handed.   Four cups caffeine per day.   Lives alone.   Social Determinants of Health   Financial Resource Strain:   . Difficulty of Paying Living Expenses: Not on file  Food Insecurity:   . Worried About Charity fundraiser in the Last Year: Not on file  . Ran Out of Food in the  Last Year: Not on file  Transportation Needs:   . Lack of Transportation (Medical): Not on file  . Lack of Transportation (Non-Medical): Not on file  Physical Activity:   . Days of Exercise per Week: Not on file  . Minutes of Exercise per Session: Not on file  Stress:   . Feeling of Stress : Not on file  Social Connections:   . Frequency of Communication with Friends and Family: Not on file  . Frequency of Social Gatherings with Friends and Family: Not on file  . Attends Religious Services: Not on file  . Active Member of Clubs or Organizations: Not on file  .  Attends Archivist Meetings: Not on file  . Marital Status: Not on file  Intimate Partner Violence:   . Fear of Current or Ex-Partner: Not on file  . Emotionally Abused: Not on file  . Physically Abused: Not on file  . Sexually Abused: Not on file    FAMILY HISTORY: Family History  Problem Relation Age of Onset  . Glaucoma Mother   . Pancreatic cancer Father   . Multiple sclerosis Brother   . Hypertension Maternal Grandmother   . Breast cancer Neg Hx     ALLERGIES:  is allergic to levofloxacin and tape.  MEDICATIONS:  Current Outpatient Medications  Medication Sig Dispense Refill  . allopurinol (ZYLOPRIM) 100 MG tablet Take 1 tablet (100 mg total) by mouth daily. 30 tablet 0  . aspirin EC 81 MG tablet Take 81 mg by mouth daily.     . colchicine 0.6 MG tablet Take 0.5 tablets (0.3 mg total) by mouth daily. 30 tablet 0  . diphenhydrAMINE (BENADRYL) 25 mg capsule Take 1 capsule (25 mg total) by mouth every 8 (eight) hours as needed. (Patient taking differently: Take 25 mg by mouth every 8 (eight) hours as needed for itching. ) 30 capsule 6  . famotidine (PEPCID) 20 MG tablet Take 1 tablet (20 mg total) by mouth 2 (two) times daily. 60 tablet 11  . gabapentin (NEURONTIN) 100 MG capsule Take 100 mg by mouth 2 (two) times daily.     [name] HYDROmorphone (DILAUDID) 2 MG tablet Take 0.5 tablets (1 mg total) by mouth  every 6 (six) hours as needed for severe pain. 10 tablet 0  . latanoprost (XALATAN) 0.005 % ophthalmic solution Place 1 drop into the left eye at bedtime.  3  . lidocaine (LIDODERM) 5 % Place 1 patch onto the skin daily as needed (for pain- remove old patch first).     . Methoxy PEG-Epoetin Beta (MIRCERA IJ) Mircera    . midodrine (PROAMATINE) 10 MG tablet Take 10 mg by mouth 3 (three) times daily.    . ondansetron (ZOFRAN-ODT) 4 MG disintegrating tablet Take 1 tablet (4 mg total) by mouth every 8 (eight) hours as needed for nausea or vomiting. 20 tablet 11  . oxyCODONE-acetaminophen (PERCOCET) 10-325 MG tablet Take 1 tablet by mouth 4 (four) times daily as needed. (Patient not taking: Reported on 09/19/2019)    . tacrolimus (PROGRAF) 1 MG capsule Take 2-3 mg by mouth See admin instructions. Take 3 mg by mouth in the morning and 2 mg at bedtime    . Vitamin D, Ergocalciferol, (DRISDOL) 1.25 MG (50000 UNIT) CAPS capsule Take 50,000 Units by mouth every Sunday.     . zolpidem (AMBIEN) 10 MG tablet Take 1 tablet (10 mg total) by mouth at bedtime as needed for sleep. 10 tablet 0   No current facility-administered medications for this visit.    REVIEW OF SYSTEMS:   Constitutional: ( - ) fevers, ( - )  chills , ( - ) night sweats Eyes: ( - ) blurriness of vision, ( - ) double vision, ( - ) watery eyes Ears, nose, mouth, throat, and face: ( - ) mucositis, ( - ) sore throat Respiratory: ( - ) cough, ( - ) dyspnea, ( - ) wheezes Cardiovascular: ( - ) palpitation, ( - ) chest discomfort, ( - ) lower extremity swelling Gastrointestinal:  ( - ) nausea, ( - ) heartburn, ( - ) change in bowel habits Skin: ( - ) abnormal skin rashes Lymphatics: ( - )  new lymphadenopathy, ( - ) easy bruising Neurological: ( - ) numbness, ( - ) tingling, ( - ) new weaknesses Behavioral/Psych: ( - ) mood change, ( - ) new changes  All other systems were reviewed with the patient and are negative.  PHYSICAL EXAMINATION: ECOG  PERFORMANCE STATUS: 1 - Symptomatic but completely ambulatory  Vitals:   10/13/19 0927  BP: (!) 98/54  Pulse: 65  Resp: 18  Temp: (!) 96.7 F (35.9 C)  SpO2: 93%   Filed Weights   10/13/19 0927  Weight: 111 lb 4.8 oz (50.5 kg)    GENERAL: middle aged [place] American female, alert, no distress and comfortable SKIN: skin color, texture, turgor are normal, no rashes or significant lesions EYES: conjunctiva are pink and non-injected, sclera clear LUNGS: clear to auscultation and percussion with normal breathing effort HEART: regular rate & rhythm and no murmurs and no lower extremity edema Musculoskeletal: no cyanosis of digits and no clubbing  PSYCH: alert & oriented x 3, fluent speech NEURO: no focal motor/sensory deficits  LABORATORY DATA:  I have reviewed the data as listed CBC Latest Ref Rng & Units 10/13/2019 09/15/2019 09/03/2019  WBC 4.0 - 10.5 K/uL 5.3 5.7 5.8  Hemoglobin 12.0 - 15.0 g/dL 10.1(L) 9.3(L) 8.7(L)  Hematocrit 36 - 46 % 33.5(L) 30.7(L) 28.2(L)  Platelets 150 - 400 K/uL 170 145(L) 97(L)    CMP Latest Ref Rng & Units 10/13/2019 09/15/2019 09/02/2019  Glucose 70 - 99 mg/dL 91 69(L) 90  BUN 6 - 20 mg/dL 26(H) 38(H) 25(H)  Creatinine 0.44 - 1.00 mg/dL 4.30(HH) 5.55(HH) 5.44(H)  Sodium 135 - 145 mmol/L 138 138 139  Potassium 3.5 - 5.1 mmol/L 4.9 4.5 3.9  Chloride 98 - 111 mmol/L 97(L) 101 101  CO2 22 - 32 mmol/L 33(H) 24 27  Calcium 8.9 - 10.3 mg/dL 9.9 10.3 9.0  Total Protein 6.5 - 8.1 g/dL 7.0 6.4(L) 5.4(L)  Total Bilirubin 0.3 - 1.2 mg/dL 0.7 1.0 1.5(H)  Alkaline Phos 38 - 126 U/L 247(H) 215(H) 125  AST 15 - 41 U/L 44(H) 39 34  ALT 0 - 44 U/L 34 30 27    No results found for: MPROTEIN Lab Results  Component Value Date   KPAFRELGTCHN 150.0 (H) 09/15/2019   LAMBDASER 87.4 (H) 09/15/2019   KAPLAMBRATIO 1.72 (H) 09/15/2019     RADIOGRAPHIC STUDIES: VAS [name] UPPER EXTREMITY ARTERIAL DUPLEX  Result Date: 09/19/2019 UPPER EXTREMITY DUPLEX STUDY  Indications: Pre-operative exam.  Performing Technologist: Alvia Grove RVT  Examination Guidelines: A complete evaluation includes B-mode imaging, spectral Doppler, color Doppler, and power Doppler as needed of all accessible portions of each vessel. Bilateral testing is considered an integral part of a complete examination. Limited examinations for reoccurring indications may be performed as noted.  Right Pre-Dialysis Findings: +-----------------------+----------+--------------------+----------+--------+ Location               PSV (cm/s)Intralum. Diam. (cm)Waveform  Comments +-----------------------+----------+--------------------+----------+--------+ Brachial Antecub. fossa85        0.40                monophasic         +-----------------------+----------+--------------------+----------+--------+ Radial Art at Wrist    53        0.18                monophasic         +-----------------------+----------+--------------------+----------+--------+ Ulnar Art at Wrist     70        0.13  monophasic         +-----------------------+----------+--------------------+----------+--------+ Left Pre-Dialysis Findings: +-----------------------+----------+--------------------+----------+--------+ Location               PSV (cm/s)Intralum. Diam. (cm)Waveform  Comments +-----------------------+----------+--------------------+----------+--------+ Brachial Antecub. fossa88        0.30                monophasic         +-----------------------+----------+--------------------+----------+--------+ Radial Art at Wrist    60        0.15                monophasic         +-----------------------+----------+--------------------+----------+--------+ Ulnar Art at Wrist     39        0.15                monophasic         +-----------------------+----------+--------------------+----------+--------+  Summary:   Measurements above.   Technically difficult exam, limited visualization.  *See table(s) above for measurements and observations. Electronically signed by Monica Martinez MD on 09/19/2019 at 4:55:43 PM.    Final    VAS [name] UPPER EXTREMITY VEIN MAPPING  Result Date: 09/19/2019 UPPER EXTREMITY VEIN MAPPING  Indications: Pre-access. History: Left upper arm graft & forearm loop graft placed years ago.  Performing Technologist: Alvia Grove RVT  Examination Guidelines: A complete evaluation includes B-mode imaging, spectral Doppler, color Doppler, and power Doppler as needed of all accessible portions of each vessel. Bilateral testing is considered an integral part of a complete examination. Limited examinations for reoccurring indications may be performed as noted. +-----------------+-------------+----------+--------------+ Right Cephalic   Diameter (cm)Depth (cm)   Findings    +-----------------+-------------+----------+--------------+ Shoulder                                   thrombus    +-----------------+-------------+----------+--------------+ Prox upper arm    0.24 / 0.31            thrombus Tolstoy   +-----------------+-------------+----------+--------------+ Mid upper arm        0.20                              +-----------------+-------------+----------+--------------+ Dist upper arm       0.21                              +-----------------+-------------+----------+--------------+ Antecubital fossa  0.21 /.14                           +-----------------+-------------+----------+--------------+ Prox forearm         0.19                              +-----------------+-------------+----------+--------------+ Mid forearm          0.13                              +-----------------+-------------+----------+--------------+ Dist forearm                            not visualized +-----------------+-------------+----------+--------------+ Wrist  not visualized  +-----------------+-------------+----------+--------------+ +-----------------+-------------+----------+----------------+ Right Basilic    Diameter (cm)Depth (cm)    Findings     +-----------------+-------------+----------+----------------+ Prox upper arm       0.74               partial thrombus +-----------------+-------------+----------+----------------+ Mid upper arm        0.48               partial thrombus +-----------------+-------------+----------+----------------+ Dist upper arm       0.62                   thrombus     +-----------------+-------------+----------+----------------+ Antecubital fossa    0.43                                +-----------------+-------------+----------+----------------+ Prox forearm         0.29                                +-----------------+-------------+----------+----------------+ +-----------------+-------------+----------+--------------+ Left Cephalic    Diameter (cm)Depth (cm)   Findings    +-----------------+-------------+----------+--------------+ Shoulder             0.25                              +-----------------+-------------+----------+--------------+ Prox upper arm       0.21                              +-----------------+-------------+----------+--------------+ Mid upper arm        0.16                              +-----------------+-------------+----------+--------------+ Dist upper arm       0.18                              +-----------------+-------------+----------+--------------+ Antecubital fossa 0.11 / 0.19                          +-----------------+-------------+----------+--------------+ Prox forearm         0.28                              +-----------------+-------------+----------+--------------+ Mid forearm          0.17                              +-----------------+-------------+----------+--------------+ Dist forearm         0.17                               +-----------------+-------------+----------+--------------+ Wrist                                   not visualized +-----------------+-------------+----------+--------------+ +-----------------+-------------+----------+--------------+ Left Basilic     Diameter (cm)Depth (cm)   Findings    +-----------------+-------------+----------+--------------+ Shoulder  not visualized +-----------------+-------------+----------+--------------+ Prox upper arm                              graft      +-----------------+-------------+----------+--------------+ Mid upper arm                               graft      +-----------------+-------------+----------+--------------+ Dist upper arm                              graft      +-----------------+-------------+----------+--------------+ Antecubital fossa                           graft      +-----------------+-------------+----------+--------------+ Prox forearm                                graft      +-----------------+-------------+----------+--------------+ Summary:   Measurements above.   Thrombus visualized in the right cephalic vein in the proximal  upper arm and shoulder area, age undetermined. Thrombus also  visualized in the Basilic vein in the mid and upper arm, age  undetermined. *See table(s) above for measurements and observations.  Diagnosing physician: Monica Martinez MD Electronically signed by Monica Martinez MD on 09/19/2019 at 4:57:51 PM.    Final     ASSESSMENT & PLAN Tina Watkins 54 y.o. female with medical history significant for macrocytic anemia in the setting of ESRD who presents for a follow up visit.  To review the labs, review the records, discussion with the patient her findings are most consistent with chronic anemia secondary to ESRD.  It is possible the patient has an underlying bone marrow disorder which is being masked by the erythropoietin shots that she  receives with dialysis.  We discussed that a bone marrow biopsy could be done in order to confirm there was no underlying bone marrow disorder, though I do suspect that this is considerably less likely than the expansion of ESRD.  The patient noted that she did not wish to pursue this procedure at this time and therefore I think it be reasonable to hold off unless she would develop new or worsening cytopenias.  At this time I would recommend continued follow-up with nephrology and we referral to our service in the event that she would develop any new hematological abnormalities.  #Chronic Anemia in Setting of CKD --after extensive lab evaluation her bloodwork evaluation are most consistent with anemia 2/2 to ESRD --the EPO she receives with dialysis may be masking an underlying bone marrow disorder (unlikely, but possible). We discussed that bone marrow biopsy could be performed to be certain, however she declines that procedure at this time --continue to follow with nephrology and receive anemia support with dialysis --no need for further evaluation in our clinic unless she were to have new or worsening cytopenia.   No orders of the defined types were placed in this encounter.   All questions were answered. The patient knows to call the clinic with any problems, questions or concerns.  A total of more than 30 minutes were spent on this encounter and over half of that time was spent on counseling and coordination of care as outlined above.   Ledell Peoples,  MD Department of Hematology/Oncology [place] [facility] Phone: [phone] Pager: [phone] Email: [email or web address]  10/14/2019 1:29 PM

## 2019-10-14 ENCOUNTER — Encounter: Payer: Self-pay | Admitting: Hematology and Oncology

## 2019-10-16 ENCOUNTER — Other Ambulatory Visit: Payer: Self-pay

## 2019-10-16 NOTE — Patient Instructions (Signed)
Visit Information [name], thank you for taking my call today. I'm sorry you are feeling unwell. I will reach back out to you next week as discussed.  [name] was given information about Medicaid Managed Care team care coordination services and consented to engagement with the [facility] Managed Care team.   Goals Addressed              This Visit's Progress   .  "I want an aide to come to the home to help me a little bit with some light housework and meal preparation." (pt-stated)        [place] (see longitudinal plan of care for additional care plan information)  Current Barriers:  . Over the next 30 days, patient will work with CM team to address in-home personal care needs. Patient requests additional assistance with ADL/IADL. Home health PT consult pending.  Clinical Social Work Clinical Goal(s):  [name] Over the next 30 days, patient will work with CM team to address needs related to in-home personal care needs.  Interventions: . Inter-disciplinary care team collaboration (see longitudinal plan of care) . Patient interviewed and appropriate assessments performed . Provided education to patient/caregiver regarding level of care options. Patient may be eligible for [facility] services; will collaborate with home health PT regarding personal care needs.  Patient Self Care Activities:  . Patient will work with CM team to address personal care needs. . Licensed Clinical Social Worker will work with MM care team and PCP office to address personal care needs.  Initial goal documentation     .  "I want to get some physical therapy when I am back at home. I want to be able to walk normal without any type of aid to assist me." (pt-stated)        [place] Medicaid Managed Care (see longitudinal plan of care for additional care plan information)  Current Barriers:  . Care coordination needs related to physical mobility and therapy need . Patient  discharged from [facility] after short-term stay for rehab. Her facility SW states she was discharged with home health - unable to determine agency.  Clinical Social Work Clinical Goal(s):  [name] Over the next 14 days, patient will work with CM team to address needs related to home physical therapy.  Interventions: . Inter-disciplinary care team collaboration (see longitudinal plan of care) . Patient interviewed and appropriate assessments performed . Referred to [place] colleague for follow-up regarding home care needs.  Patient Self Care Activities:  . Patient will work with CM team over the next 30 days.   Initial goal documentation        Patient unwell, no education provided.  The Managed Medicaid care management team will reach out to the patient again over the next 7 days.   Tina Watkins, MSW, LCSW Social Work Case Freight forwarder - [place]  [facility] [place]: [phone]

## 2019-10-16 NOTE — Patient Outreach (Signed)
Care Coordination- Social Work  10/16/2019  Tina Watkins 1965-11-30 [id number]  Subjective:    Tina Watkins is an 54 y.o. year old female who is a primary patient of [name], FNP.    Ms. Dziuba was given information about Medicaid Managed Care team care coordination services today. Tina Watkins agreed to services and verbal consent obtained  Review of patient status, laboratory and other test data was performed as part of evaluation for provision of services.  Patient has been discharged from SNF, was unable to speak on the phone today reporting nausea and vomiting. Please see care plan entry below. Patient may benefit from home health PT and PCS services.   SDOH:   SDOH Screenings   Alcohol Screen:   . Last Alcohol Screening Score (AUDIT): Not on file  Depression (PHQ2-9):   . PHQ-2 Score: Not on file  Financial Resource Strain:   . Difficulty of Paying Living Expenses: Not on file  Food Insecurity:   . Worried About Charity fundraiser in the Last Year: Not on file  . Ran Out of Food in the Last Year: Not on file  Housing:   . Last Housing Risk Score: Not on file  Physical Activity:   . Days of Exercise per Week: Not on file  . Minutes of Exercise per Session: Not on file  Social Connections:   . Frequency of Communication with Friends and Family: Not on file  . Frequency of Social Gatherings with Friends and Family: Not on file  . Attends Religious Services: Not on file  . Active Member of Clubs or Organizations: Not on file  . Attends Archivist Meetings: Not on file  . Marital Status: Not on file  Stress:   . Feeling of Stress : Not on file  Tobacco Use: High Risk  . Smoking Tobacco Use: Current Every Day Smoker  . Smokeless Tobacco Use: Never Used  Transportation Needs:   . Film/video editor (Medical): Not on file  . Lack of Transportation (Non-Medical): Not on file     Objective:    Medications:   Medications Reviewed Today    Reviewed by Orson Slick, MD (Physician) on 10/14/19 at 1328  Med List Status: <None>  Medication Order Taking? Sig Documenting Provider Last Dose Status Informant  allopurinol (ZYLOPRIM) 100 MG tablet [id number] No Take 1 tablet (100 mg total) by mouth daily. [name], Pratik D, DO Taking Expired 09/24/19 2359 Self  aspirin EC 81 MG tablet [id number] No Take 81 mg by mouth daily.  [provider] Taking Active Self  colchicine 0.6 MG tablet [id number] No Take 0.5 tablets (0.3 mg total) by mouth daily. [name], MD Taking Active Self           Med Note [name], Benjamine Mola A   Fri Sep 15, 2019  8:21 AM) Just takes when has gout flare  diphenhydrAMINE (BENADRYL) 25 mg capsule [id number] No Take 1 capsule (25 mg total) by mouth every 8 (eight) hours as needed.  Patient taking differently: Take 25 mg by mouth every 8 (eight) hours as needed for itching.    [name], FNP Taking Active Self  famotidine (PEPCID) 20 MG tablet [id number] No Take 1 tablet (20 mg total) by mouth 2 (two) times daily. [name], FNP Taking Active Self  gabapentin (NEURONTIN) 100 MG capsule [id number] No Take 100 mg by mouth 2 (two) times daily.  [provider] Taking Active Self  HYDROmorphone (DILAUDID) 2 MG tablet [id number] No Take 0.5 tablets (1 mg total) by mouth every 6 (six) hours as needed for severe pain. [name], MD Taking Active   latanoprost (XALATAN) 0.005 % ophthalmic solution [id number] No Place 1 drop into the left eye at bedtime. [provider] Taking Active Self           Med Note [name] Apr 17, 2019  6:42 PM)    lidocaine (LIDODERM) 5 % [id number] No Place 1 patch onto the skin daily as needed (for pain- remove old patch first).  [provider] Taking Active Self  Methoxy PEG-Epoetin Ernst Spell (MIRCERA IJ) [id number] No Mircera [provider] Taking Active   midodrine (PROAMATINE) 10 MG tablet  [id number] No Take 10 mg by mouth 3 (three) times daily. [provider] Taking Active Self  ondansetron (ZOFRAN-ODT) 4 MG disintegrating tablet [id number] No Take 1 tablet (4 mg total) by mouth every 8 (eight) hours as needed for nausea or vomiting. [name], FNP Taking Active Self  oxyCODONE-acetaminophen (PERCOCET) 10-325 MG tablet [id number] No Take 1 tablet by mouth 4 (four) times daily as needed.  Patient not taking: Reported on 09/19/2019   [provider] Not Taking Active   tacrolimus (PROGRAF) 1 MG capsule [id number] No Take 2-3 mg by mouth See admin instructions. Take 3 mg by mouth in the morning and 2 mg at bedtime [name], MD Taking Active Self           Med Note [name] Apr 17, 2019  6:42 PM)    Vitamin D, Ergocalciferol, (DRISDOL) 1.25 MG (50000 UNIT) CAPS capsule [id number] No Take 50,000 Units by mouth every Sunday.  [provider] Taking Active Self  zolpidem (AMBIEN) 10 MG tablet [id number] No Take 1 tablet (10 mg total) by mouth at bedtime as needed for sleep. [name], DO Taking Active Self          Fall/Depression Screening:  Fall Risk  06/13/2019 05/08/2019 01/04/2019  Falls in the past year? 0 0 1  Number falls in past yr: 0 0 0  Injury with Fall? 0 0 1   PHQ 2/9 Scores 06/26/2014  PHQ - 2 Score 6    Assessment:  Goals Addressed              This Visit's Progress   .  "I want an aide to come to the home to help me a little bit with some light housework and meal preparation." (pt-stated)        [place] (see longitudinal plan of care for additional care plan information)  Current Barriers:  . Over the next 30 days, patient will work with CM team to address in-home personal care needs. Patient requests additional assistance with ADL/IADL. Home health PT consult pending.  Clinical Social Work Clinical Goal(s):  [name] Over the next 30 days, patient will work with CM team  to address needs related to in-home personal care needs.  Interventions: . Inter-disciplinary care team collaboration (see longitudinal plan of care) . Patient interviewed and appropriate assessments performed . Provided education to patient/caregiver regarding level of care options. Patient may be eligible for [facility] services; will collaborate with home health PT regarding personal care needs.  Patient Self Care Activities:  . Patient will work with CM team to address personal care needs. . Licensed Clinical Social Worker will work with MM care  team and PCP office to address personal care needs.  Initial goal documentation     .  "I want to get some physical therapy when I am back at home. I want to be able to walk normal without any type of aid to assist me." (pt-stated)        [place] Medicaid Managed Care (see longitudinal plan of care for additional care plan information)  Current Barriers:  . Care coordination needs related to physical mobility and therapy need . Patient discharged from [facility] SNF after short-term stay for rehab. Her facility SW states she was discharged with home health - unable to determine agency.  Clinical Social Work Clinical Goal(s):  [name] Over the next 14 days, patient will work with CM team to address needs related to home physical therapy.  Interventions: . Inter-disciplinary care team collaboration (see longitudinal plan of care) . Patient interviewed and appropriate assessments performed . Referred to [place] colleague for follow-up regarding home care needs.  Patient Self Care Activities:  . Patient will work with CM team over the next 30 days.   Initial goal documentation        Plan: CM team follow-up over the next 7 days.   Netta Neat, MSW, LCSW Social Work Case Freight forwarder - [place]  [place]: [phone]

## 2019-10-18 DIAGNOSIS — T8249XA Other complication of vascular dialysis catheter, initial encounter: Secondary | ICD-10-CM | POA: Diagnosis not present

## 2019-10-18 DIAGNOSIS — N186 End stage renal disease: Secondary | ICD-10-CM | POA: Diagnosis not present

## 2019-10-18 DIAGNOSIS — Z992 Dependence on renal dialysis: Secondary | ICD-10-CM | POA: Diagnosis not present

## 2019-10-19 ENCOUNTER — Other Ambulatory Visit (HOSPITAL_COMMUNITY): Payer: Self-pay | Admitting: Nephrology

## 2019-10-19 ENCOUNTER — Other Ambulatory Visit: Payer: Self-pay | Admitting: Radiology

## 2019-10-19 DIAGNOSIS — N186 End stage renal disease: Secondary | ICD-10-CM

## 2019-10-19 DIAGNOSIS — Z992 Dependence on renal dialysis: Secondary | ICD-10-CM

## 2019-10-19 NOTE — Progress Notes (Signed)
Cardiology Office Note   Date:  10/20/2019   ID:  Tina Watkins, DOB Aug 18, 1965, MRN [id number]  PCP:  Tina Glatter, FNP  Cardiologist:  Tina Field, MD EP: None  Chief Complaint  Patient presents with  . Hospitalization Follow-up      History of Present Illness: Tina Watkins is a 54 y.o. 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  She was last evaluated by cardiology at an outpatient visit with Dr. Audie Watkins 05/08/2019, at which time she was noted to be markedly volume overloaded. She was recommended to present to the hospital for IV diuresis, however initially refused. Unfortunately lab work at that visit showed worsening Cr and Hgb <7. She was subsequently admitted 05/08/19-05/18/19 where she was found to have acute on chronic diastolic CHF requiring IV lasix per nephrology given AoCKD s/p transplant, acute on chronic anemia, as well as COVID-19 PNA. She had another prolonged hospitalization from 07/07/19-07/17/19 where unfortunately she presented again with volume overload with further decline in kidney function to ESRD requiring initiation of HD. Again hospitalized from 08/05/19-08/24/19 after admission for HCAP c/b a gout flare. Her level of deconditioning/debility was felt to require SNF placement which prolonged her hospital stay due to insurance trouble and difficulty finding a facility that could accommodate her HD needs.  Lastly she was admitted from 09/01/19-09/03/19 for acute on chronic anemia requiring transfusion.   Her last echocardiogram 01/2019 showed EF 60-65%, moderate LVH, G3DD, elevated LVEDP, moderately elevated PA pressures, and moderate TR.   She presents today for post-hospital follow-up. She has been home from rehab for about a month. Feels like she is getting her strength back. She is walking more easily now that her gout  is under control. She reports her cough finally subsided about a month ago and she has been breathing much more easily since then. She has some DOE which is at baseline. No complaints of chest pain. Unfortunately she has been having trouble with dialysis access recently and will be following up with radiology after this visit to re-establish access for HD tomorrow. She reports she is praying for a 4th miracle for her kidneys now that she has had 3 failed transplants. No complaints of palpitations, dizziness, lightheadedness, or syncope.      Past Medical History:  Diagnosis Date  . Bacteremia due to Gram-negative bacteria 05/23/2011  . Blind    right eye  . Blind right eye   . CHF (congestive heart failure) ([place])   . Chronic lower back pain   . Complication of anesthesia    "woke up during OR; I have an extremely high tolerance" (12/11/2011) 1 procedure was graft; the other procedures were procedures that are typically done with sedation.  . DDD (degenerative disc disease), cervical   . Depression   . Dysrhythmia    "tachycardia" (12/11/2011) new onset afib 10/15/14 EKG  . E coli bacteremia 06/18/2011  . Elevated LDL cholesterol level 12/2018  . ESRD (end stage renal disease) ([place]) 06/12/2011  . Fibromyalgia   . Gastroparesis   . Gastroparesis   . GERD (gastroesophageal reflux disease)   . Glaucoma    right eye  . Gout   . H/O carpal tunnel syndrome   . Headache(784.0)    "not often anymore" (12/11/2011)  . Herpes genitalia 1994  . History of blood transfusion    "more than  a few times" (12/11/2011)  . History of stomach ulcers   . Hypotension   . Iron deficiency anemia   . New onset a-fib ([place])    10/15/14 EKG  . Osteopenia   . Peripheral neuropathy 11/2018  . Pressure ulcer of sacral region, stage 1 07/2019  . Seizures ([place]) 1994   "post transplant; only have had that one" (12/11/2011)  . Spinal stenosis in cervical region   . Stroke [facility])     left basal ganglia lacunar  infarct; Right frontal lobe lacunar infarct.  . Stroke [facility]) ~ 1999; 2001   "briefly lost my vision; lost my right eye" (12/11/2011)  . Vitamin D deficiency 12/2018    Past Surgical History:  Procedure Laterality Date  . ANTERIOR CERVICAL DECOMP/DISCECTOMY FUSION N/A 01/08/2015   Procedure: Anterior Cervical Three-Four/Four-Five Decompression/Diskectomy/Fusion;  Surgeon: Leeroy Cha, MD;  Location: [place] [facility]URO ORS;  Service: Neurosurgery;  Laterality: N/A;  C3-4 C4-5 Anterior cervical decompression/diskectomy/fusion  . APPENDECTOMY  ~ 2004  . CATARACT EXTRACTION     right eye  . COLONOSCOPY    . ENUCLEATION  2001   "right"  . ESOPHAGOGASTRODUODENOSCOPY (EGD) WITH PROPOFOL N/A 04/21/2012   Procedure: ESOPHAGOGASTRODUODENOSCOPY (EGD) WITH PROPOFOL;  Surgeon: Milus Banister, MD;  Location: [facility] ENDOSCOPY;  Service: Endoscopy;  Laterality: N/A;  . 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 IR FLUORO GUIDE CV LINE RIGHT  07/11/2019  . IR RADIOLOGY PERIPHERAL GUIDED IV START  07/11/2019  . IR US GUIDE VASC ACCESS RIGHT  07/11/2019  . IR US GUIDE VASC ACCESS RIGHT  07/11/2019  . IR US GUIDE VASC ACCESS RIGHT  07/11/2019  . [place]; 1999; 2005   "right"  . MULTIPLE TOOTH EXTRACTIONS    . RIGHT HEART CATH N/A 03/23/2019   Procedure: RIGHT HEART CATH;  Surgeon: Jolaine Artist, MD;  Location: [place] CV LAB;  Service: Cardiovascular;  Laterality: N/A;  . TONSILLECTOMY    . TOTAL NEPHRECTOMY  1988?; 1994; 2005     Current Outpatient Medications  Medication Sig Dispense Refill  . allopurinol (ZYLOPRIM) 100 MG tablet Take 1 tablet (100 mg total) by mouth daily. 30 tablet 0  . aspirin EC 81 MG tablet Take 81 mg by mouth daily.     . colchicine 0.6 MG tablet Take 0.5 tablets (0.3 mg total) by mouth daily. 30 tablet 0  . diphenhydrAMINE (BENADRYL) 25 mg capsule Take 1 capsule (25 mg total) by mouth every 8 (eight) hours as  needed. (Patient taking differently: Take 25 mg by mouth every 8 (eight) hours as needed for itching. ) 30 capsule 6  . famotidine (PEPCID) 20 MG tablet Take 1 tablet (20 mg total) by mouth 2 (two) times daily. 60 tablet 11  . gabapentin (NEURONTIN) 100 MG capsule Take 100 mg by mouth 2 (two) times daily.     [name] HYDROmorphone (DILAUDID) 2 MG tablet Take 0.5 tablets (1 mg total) by mouth every 6 (six) hours as needed for severe pain. 10 tablet 0  . latanoprost (XALATAN) 0.005 % ophthalmic solution Place 1 drop into the left eye at bedtime.  3  . lidocaine (LIDODERM) 5 % Place 1 patch onto the skin daily as needed (for pain- remove old patch first).     . Methoxy PEG-Epoetin Beta (MIRCERA IJ) Mircera    . midodrine (PROAMATINE) 10 MG tablet Take 10 mg by mouth 3 (  three) times daily.    . ondansetron (ZOFRAN-ODT) 4 MG disintegrating tablet Take 1 tablet (4 mg total) by mouth every 8 (eight) hours as needed for nausea or vomiting. 20 tablet 11  . oxyCODONE-acetaminophen (PERCOCET) 10-325 MG tablet Take 1 tablet by mouth 4 (four) times daily as needed.     . tacrolimus (PROGRAF) 1 MG capsule Take 2-3 mg by mouth See admin instructions. Take 3 mg by mouth in the morning and 2 mg at bedtime    . Vitamin D, Ergocalciferol, (DRISDOL) 1.25 MG (50000 UNIT) CAPS capsule Take 50,000 Units by mouth every Sunday.     . zolpidem (AMBIEN) 10 MG tablet Take 1 tablet (10 mg total) by mouth at bedtime as needed for sleep. 10 tablet 0   No current facility-administered medications for this visit.   Facility-Administered Medications Ordered in Other Visits  Medication Dose Route Frequency Provider Last Rate Last Admin  . ceFAZolin (ANCEF) 2-4 GM/100ML-% IVPB           . ceFAZolin (ANCEF) IVPB 2g/100 mL premix  2 g Intravenous to Edward Jolly, PA-C      . fentaNYL (SUBLIMAZE) 100 MCG/2ML injection             Allergies:   Levofloxacin and Tape    Social History:  The patient  reports that she has been  smoking cigarettes. She has a 17.50 pack-year smoking history. She has never used smokeless tobacco. She reports current alcohol use. She reports current drug use. Drug: Marijuana.   Family History:  The patient's family history includes Glaucoma in her mother; Hypertension in her maternal grandmother; Multiple sclerosis in her brother; Pancreatic cancer in her father.    ROS:  Please see the history of present illness.   Otherwise, review of systems are positive for none.   All other systems are reviewed and negative.    PHYSICAL EXAM: VS:  BP 110/65   Pulse 64   Ht $R'4\' 11"'Sz$  (1.499 m)   Wt 114 lb (51.7 kg)   SpO2 97%   BMI 23.03 kg/m  , BMI Body mass index is 23.03 kg/m. GEN: Well nourished, well developed, in no acute distress HEENT: atraumatic Neck: no JVD, carotid bruits, or masses Cardiac: RRR; +murmur, no rubs, or gallops, trace-1+ LE edema  Respiratory:  normal work of breathing, faint crackles at lung bases GI: soft, nontender, nondistended, + BS MS: no deformity or atrophy Skin: warm and dry, no rash Neuro:  Strength and sensation are intact Psych: euthymic mood, full affect   EKG:  EKG is not ordered today.   Recent Labs: 07/07/2019: B Natriuretic Peptide 474.1 09/15/2019: TSH 2.469 10/13/2019: ALT 34; BUN 26; Creatinine 4.30; Hemoglobin 10.1; Platelet Count 170; Potassium 4.9; Sodium 138    Lipid Panel    Component Value Date/Time   CHOL 214 (H) 12/06/2018 1052   TRIG 118 12/06/2018 1052   HDL 89 12/06/2018 1052   CHOLHDL 2.4 12/06/2018 1052   CHOLHDL 2.9 08/23/2008 0604   VLDL 21 08/23/2008 0604   LDLCALC 105 (H) 12/06/2018 1052      Wt Readings from Last 3 Encounters:  10/20/19 114 lb (51.7 kg)  10/13/19 111 lb 4.8 oz (50.5 kg)  09/19/19 110 lb (49.9 kg)      Other studies Reviewed: Additional studies/ records that were reviewed today include:   Echocardiogram 01/2019: 1. Left ventricular ejection fraction, by visual estimation, is 60 to  65%. The  left ventricle has normal function. There  is moderately increased  left ventricular hypertrophy.  2. Left ventricular diastolic parameters are consistent with Grade III  diastolic dysfunction (restrictive).  3. Elevated left ventricular end-diastolic pressure.  4. Global right ventricle has normal systolic function.The right  ventricular size is normal. No increase in right ventricular wall  thickness.  5. Moderately elevated pulmonary artery systolic pressure.  6. The tricuspid regurgitant velocity is 3.22 m/s, and with an assumed  right atrial pressure of 3 mmHg, the estimated right ventricular systolic  pressure is moderately elevated at 44.6 mmHg.  7. Left atrial size was normal.  8. Right atrial size was normal.  9. Trivial pericardial effusion is present. No findings to suggest  constrictive physiology.  10. The mitral valve is normal in structure. Trivial mitral valve  regurgitation. No evidence of mitral stenosis.  11. The tricuspid valve is normal in structure. Moderate TR.  12. The aortic valve has an indeterminant number of cusps. Aortic valve  regurgitation is not visualized. Mild aortic valve sclerosis without  stenosis.  13. The pulmonic valve was normal in structure. Pulmonic valve  regurgitation is trivial.  14. The inferior vena cava is normal in size with greater than 50%  respiratory variability, suggesting right atrial pressure of 3 mmHg.  15. The left ventricle has no regional wall motion abnormalities.     ASSESSMENT AND PLAN:  1. Chronic diastolic CHF: volume managed by nephrology at dialysis. Hypotension limits use of BBlocker. ESRD further limits GDMT. She has some very faint crackles at lung bases but overall volume status appears stable.  - Continue volume management per nephrology - Encouraged a low sodium diet and monitoring of daily weights  2. HTN: more recently she has struggled with hypotension requiring midodrine. BP stable today. -  Continue to monitor  3. Anemia of chronic disease: likely 2/2 ESRD. Hgb stable at 10.1 10/13/19. Seen by hematology who feels anemia is likely related to ESRD, though cannot exclude bone marrow suppresion given use of EPO at dialysis, though patient was not interrested in pursuing a bone marrow biopsy - Continue to monitor closely  4. ESRD on HD: sadly has had 3 failed transplant. Started on HD 07/2019.  - Continue management per nephrology  5. Tricuspid regurgitation: moderate on last echo 01/2019 - Continue volume management as above   Current medicines are reviewed at length with the patient today.  The patient does not have concerns regarding medicines.  The following changes have been made:  As above  Labs/ tests ordered today include:  No orders of the defined types were placed in this encounter.    Disposition:   FU with Dr. Audie Watkins in 2-3 months  Signed, Abigail Butts, PA-C  10/20/2019 12:25 PM

## 2019-10-20 ENCOUNTER — Ambulatory Visit (INDEPENDENT_AMBULATORY_CARE_PROVIDER_SITE_OTHER): Payer: Medicare Other | Admitting: Family Medicine

## 2019-10-20 ENCOUNTER — Ambulatory Visit (INDEPENDENT_AMBULATORY_CARE_PROVIDER_SITE_OTHER): Payer: Medicare Other | Admitting: Medical

## 2019-10-20 ENCOUNTER — Other Ambulatory Visit (HOSPITAL_COMMUNITY): Payer: Self-pay | Admitting: Nephrology

## 2019-10-20 ENCOUNTER — Encounter: Payer: Self-pay | Admitting: Medical

## 2019-10-20 ENCOUNTER — Other Ambulatory Visit: Payer: Self-pay

## 2019-10-20 ENCOUNTER — Encounter: Payer: Self-pay | Admitting: Family Medicine

## 2019-10-20 ENCOUNTER — Ambulatory Visit: Payer: Medicaid Other | Admitting: Family Medicine

## 2019-10-20 ENCOUNTER — Ambulatory Visit (HOSPITAL_COMMUNITY)
Admission: RE | Admit: 2019-10-20 | Discharge: 2019-10-20 | Disposition: A | Discharge status: Home / Self Care | Payer: Medicare Other | Source: Ambulatory Visit | Attending: Nephrology | Admitting: Nephrology

## 2019-10-20 ENCOUNTER — Other Ambulatory Visit: Payer: Self-pay | Admitting: Student

## 2019-10-20 VITALS — BP 110/59 | HR 64 | Temp 97.2°F | Ht 59.0 in | Wt 114.2 lb

## 2019-10-20 VITALS — BP 110/65 | HR 64 | Ht 59.0 in | Wt 114.0 lb

## 2019-10-20 DIAGNOSIS — I10 Essential (primary) hypertension: Secondary | ICD-10-CM

## 2019-10-20 DIAGNOSIS — Z9889 Other specified postprocedural states: Secondary | ICD-10-CM | POA: Diagnosis not present

## 2019-10-20 DIAGNOSIS — R531 Weakness: Secondary | ICD-10-CM | POA: Diagnosis not present

## 2019-10-20 DIAGNOSIS — Z992 Dependence on renal dialysis: Secondary | ICD-10-CM

## 2019-10-20 DIAGNOSIS — M109 Gout, unspecified: Secondary | ICD-10-CM | POA: Diagnosis not present

## 2019-10-20 DIAGNOSIS — R519 Headache, unspecified: Secondary | ICD-10-CM

## 2019-10-20 DIAGNOSIS — N186 End stage renal disease: Secondary | ICD-10-CM

## 2019-10-20 DIAGNOSIS — M5030 Other cervical disc degeneration, unspecified cervical region: Secondary | ICD-10-CM | POA: Diagnosis not present

## 2019-10-20 DIAGNOSIS — D638 Anemia in other chronic diseases classified elsewhere: Secondary | ICD-10-CM

## 2019-10-20 DIAGNOSIS — Z09 Encounter for follow-up examination after completed treatment for conditions other than malignant neoplasm: Secondary | ICD-10-CM | POA: Diagnosis not present

## 2019-10-20 DIAGNOSIS — R52 Pain, unspecified: Secondary | ICD-10-CM | POA: Diagnosis not present

## 2019-10-20 DIAGNOSIS — I5032 Chronic diastolic (congestive) heart failure: Secondary | ICD-10-CM

## 2019-10-20 DIAGNOSIS — I509 Heart failure, unspecified: Secondary | ICD-10-CM

## 2019-10-20 DIAGNOSIS — Z4901 Encounter for fitting and adjustment of extracorporeal dialysis catheter: Secondary | ICD-10-CM | POA: Diagnosis not present

## 2019-10-20 DIAGNOSIS — Z95828 Presence of other vascular implants and grafts: Secondary | ICD-10-CM

## 2019-10-20 DIAGNOSIS — T8249XA Other complication of vascular dialysis catheter, initial encounter: Secondary | ICD-10-CM | POA: Diagnosis not present

## 2019-10-20 HISTORY — PX: IR FLUORO GUIDE CV LINE RIGHT: IMG2283

## 2019-10-20 MED ORDER — TOPIRAMATE 50 MG PO TABS: 50.0000 mg | ORAL_TABLET | Freq: Two times a day (BID) | ORAL | 3 refills | Status: DC

## 2019-10-20 MED ORDER — HEPARIN SODIUM (PORCINE) 1000 UNIT/ML IJ SOLN
INTRAMUSCULAR | Status: AC
  Filled 2019-10-20: qty 1

## 2019-10-20 MED ORDER — IOHEXOL 300 MG/ML  SOLN
50.0000 mL | Freq: Once | INTRAMUSCULAR | Status: AC | PRN
  Administered 2019-10-20: 20 mL

## 2019-10-20 MED ORDER — CHLORHEXIDINE GLUCONATE 4 % EX LIQD
CUTANEOUS | Status: DC | PRN
  Administered 2019-10-20: 1 via TOPICAL

## 2019-10-20 MED ORDER — CEFAZOLIN SODIUM-DEXTROSE 2-4 GM/100ML-% IV SOLN
INTRAVENOUS | Status: AC
  Filled 2019-10-20: qty 100

## 2019-10-20 MED ORDER — IOHEXOL 300 MG/ML  SOLN: 50.0000 mL | Freq: Once | INTRAMUSCULAR | Status: DC | PRN

## 2019-10-20 MED ORDER — FENTANYL CITRATE (PF) 100 MCG/2ML IJ SOLN
INTRAMUSCULAR | Status: AC
  Filled 2019-10-20: qty 2

## 2019-10-20 MED ORDER — CHLORHEXIDINE GLUCONATE 4 % EX LIQD
CUTANEOUS | Status: AC
  Filled 2019-10-20: qty 15

## 2019-10-20 MED ORDER — LIDOCAINE HCL 1 % IJ SOLN
INTRAMUSCULAR | Status: DC | PRN
  Administered 2019-10-20: 20 mL via INTRADERMAL

## 2019-10-20 MED ORDER — CEFAZOLIN SODIUM-DEXTROSE 2-4 GM/100ML-% IV SOLN: 2.0000 g | INTRAVENOUS | Status: DC

## 2019-10-20 MED ORDER — KETOROLAC TROMETHAMINE 30 MG/ML IJ SOLN
30.0000 mg | Freq: Once | INTRAMUSCULAR | Status: AC
  Administered 2019-10-20: 30 mg via INTRAMUSCULAR

## 2019-10-20 MED ORDER — LIDOCAINE HCL 1 % IJ SOLN
INTRAMUSCULAR | Status: AC
  Filled 2019-10-20: qty 20

## 2019-10-20 NOTE — Patient Instructions (Signed)
Topiramate tablets What is this medicine? TOPIRAMATE (toe PYRE a mate) is used to treat seizures in adults or children with epilepsy. It is also used for the prevention of migraine headaches. This medicine may be used for other purposes; ask your health care provider or pharmacist if you have questions. COMMON BRAND NAME(S): Topamax, Topiragen What should I tell my health care provider before I take this medicine? They need to know if you have any of these conditions:  bleeding disorders  kidney disease  lung or breathing disease, like asthma  suicidal thoughts, plans, or attempt; a previous suicide attempt by you or a family member  an unusual or allergic reaction to topiramate, other medicines, foods, dyes, or preservatives  pregnant or trying to get pregnant  breast-feeding How should I use this medicine? Take this medicine by mouth with a glass of water. Follow the directions on the prescription label. Do not cut, crush or chew this medicine. Swallow the tablets whole. You can take it with or without food. If it upsets your stomach, take it with food. Take your medicine at regular intervals. Do not take it more often than directed. Do not stop taking except on your doctor's advice. A special MedGuide will be given to you by the pharmacist with each prescription and refill. Be sure to read this information carefully each time. Talk to your pediatrician regarding the use of this medicine in children. While this drug may be prescribed for children as young as 2 years of age for selected conditions, precautions do apply. Overdosage: If you think you have taken too much of this medicine contact a poison control center or emergency room at once. NOTE: This medicine is only for you. Do not share this medicine with others. What if I miss a dose? If you miss a dose, take it as soon as you can. If your next dose is to be taken in less than 6 hours, then do not take the missed dose. Take the  next dose at your regular time. Do not take double or extra doses. What may interact with this medicine? This medicine may interact with the following medications:  acetazolamide  alcohol  antihistamines for allergy, cough, and cold  aspirin and aspirin-like medicines  atropine  birth control pills  certain medicines for anxiety or sleep  certain medicines for bladder problems like oxybutynin, tolterodine  certain medicines for depression like amitriptyline, fluoxetine, sertraline  certain medicines for seizures like carbamazepine, phenobarbital, phenytoin, primidone, valproic acid, zonisamide  certain medicines for stomach problems like dicyclomine, hyoscyamine  certain medicines for travel sickness like scopolamine  certain medicines for Parkinson's disease like benztropine, trihexyphenidyl  certain medicines that treat or prevent blood clots like warfarin, enoxaparin, dalteparin, apixaban, dabigatran, and rivaroxaban  digoxin  general anesthetics like halothane, isoflurane, methoxyflurane, propofol  hydrochlorothiazide  ipratropium  lithium  medicines that relax muscles for surgery  metformin  narcotic medicines for pain  NSAIDs, medicines for pain and inflammation, like ibuprofen or naproxen  phenothiazines like chlorpromazine, mesoridazine, prochlorperazine, thioridazine  pioglitazone This list may not describe all possible interactions. Give your health care provider a list of all the medicines, herbs, non-prescription drugs, or dietary supplements you use. Also tell them if you smoke, drink alcohol, or use illegal drugs. Some items may interact with your medicine. What should I watch for while using this medicine? Visit your doctor or health care professional for regular checks on your progress. Tell your health care professional if your symptoms do not   start to get better or if they get worse. Do not stop taking except on your health care professional's  advice. You may develop a severe reaction. Your health care professional will tell you how much medicine to take. Wear a medical ID bracelet or chain. Carry a card that describes your disease and details of your medicine and dosage times. This medicine can reduce the response of your body to heat or cold. Dress warm in cold weather and stay hydrated in hot weather. If possible, avoid extreme temperatures like saunas, hot tubs, very hot or cold showers, or activities that can cause dehydration such as vigorous exercise. Check with your health care professional if you have severe diarrhea, nausea, and vomiting, or if you sweat a lot. The loss of too much body fluid may make it dangerous for you to take this medicine. You may get drowsy or dizzy. Do not drive, use machinery, or do anything that needs mental alertness until you know how this medicine affects you. Do not stand up or sit up quickly, especially if you are an older patient. This reduces the risk of dizzy or fainting spells. Alcohol may interfere with the effect of this medicine. Avoid alcoholic drinks. Tell your health care professional right away if you have any change in your eyesight. Patients and their families should watch out for new or worsening depression or thoughts of suicide. Also watch out for sudden changes in feelings such as feeling anxious, agitated, panicky, irritable, hostile, aggressive, impulsive, severely restless, overly excited and hyperactive, or not being able to sleep. If this happens, especially at the beginning of treatment or after a change in dose, call your healthcare professional. This medicine may cause serious skin reactions. They can happen weeks to months after starting the medicine. Contact your health care provider right away if you notice fevers or flu-like symptoms with a rash. The rash may be red or purple and then turn into blisters or peeling of the skin. Or, you might notice a red rash with swelling of the  face, lips or lymph nodes in your neck or under your arms. Birth control may not work properly while you are taking this medicine. Talk to your health care professional about using an extra method of birth control. Women should inform their health care professional if they wish to become pregnant or think they might be pregnant. There is a potential for serious side effects and harm to an unborn child. Talk to your health care professional for more information. What side effects may I notice from receiving this medicine? Side effects that you should report to your doctor or health care professional as soon as possible:  allergic reactions like skin rash, itching or hives, swelling of the face, lips, or tongue  blood in the urine  changes in vision  confusion  loss of memory  pain in lower back or side  pain when urinating  redness, blistering, peeling or loosening of the skin, including inside the mouth  signs and symptoms of bleeding such as bloody or black, tarry stools; red or dark brown urine; spitting up blood or brown material that looks like coffee grounds; red spots on the skin; unusual bruising or bleeding from the eyes, gums, or nose  signs and symptoms of increased acid in the body like breathing fast; fast heartbeat; headache; confusion; unusually weak or tired; nausea, vomiting  suicidal thoughts, mood changes  trouble speaking or understanding  unusual sweating  unusually weak or tired Side effects   that usually do not require medical attention (report to your doctor or health care professional if they continue or are bothersome):  dizziness  drowsiness  fever  loss of appetite  nausea, vomiting  pain, tingling, numbness in the hands or feet  stomach pain  tiredness  upset stomach This list may not describe all possible side effects. Call your doctor for medical advice about side effects. You may report side effects to FDA at 1-800-FDA-1088. Where  should I keep my medicine? Keep out of the reach of children. Store at room temperature between 15 and 30 degrees C (59 and 86 degrees F). Throw away any unused medicine after the expiration date. NOTE: This sheet is a summary. It may not cover all possible information. If you have questions about this medicine, talk to your doctor, pharmacist, or health care provider.  2020 Elsevier/Gold Standard (2018-08-18 15:07:20) General Headache Without Cause A headache is pain or discomfort felt around the head or neck area. The specific cause of a headache may not be found. There are many causes and types of headaches. A few common ones are:  Tension headaches.  Migraine headaches.  Cluster headaches.  Chronic daily headaches. Follow these instructions at home: Watch your condition for any changes. Let your health care provider know about them. Take these steps to help with your condition: Managing pain      Take over-the-counter and prescription medicines only as told by your health care provider.  Lie down in a dark, quiet room when you have a headache.  If directed, put ice on your head and neck area: ? Put ice in a plastic bag. ? Place a towel between your skin and the bag. ? Leave the ice on for 20 minutes, 2-3 times per day.  If directed, apply heat to the affected area. Use the heat source that your health care provider recommends, such as a moist heat pack or a heating pad. ? Place a towel between your skin and the heat source. ? Leave the heat on for 20-30 minutes. ? Remove the heat if your skin turns bright red. This is especially important if you are unable to feel pain, heat, or cold. You may have a greater risk of getting burned.  Keep lights dim if bright lights bother you or make your headaches worse. Eating and drinking  Eat meals on a regular schedule.  If you drink alcohol: ? Limit how much you use to:  0-1 drink a day for women.  0-2 drinks a day for  men. ? Be aware of how much alcohol is in your drink. In the [place] one drink equals one 12 oz bottle of beer (355 mL), one 5 oz glass of wine (148 mL), or one 1 oz glass of hard liquor (44 mL).  Stop drinking caffeine, or decrease the amount of caffeine you drink. General instructions   Keep a headache journal to help find out what may trigger your headaches. For example, write down: ? What you eat and drink. ? How much sleep you get. ? Any change to your diet or medicines.  Try massage or other relaxation techniques.  Limit stress.  Sit up straight, and do not tense your muscles.  Do not use any products that contain nicotine or tobacco, such as cigarettes, e-cigarettes, and chewing tobacco. If you need help quitting, ask your health care provider.  Exercise regularly as told by your health care provider.  Sleep on a regular schedule. Get 7-9 hours of  sleep each night, or the amount recommended by your health care provider.  Keep all follow-up visits as told by your health care provider. This is important. Contact a health care provider if:  Your symptoms are not helped by medicine.  You have a headache that is different from the usual headache.  You have nausea or you vomit.  You have a fever. Get help right away if:  Your headache becomes severe quickly.  Your headache gets worse after moderate to intense physical activity.  You have repeated vomiting.  You have a stiff neck.  You have a loss of vision.  You have problems with speech.  You have pain in the eye or ear.  You have muscular weakness or loss of muscle control.  You lose your balance or have trouble walking.  You feel faint or pass out.  You have confusion.  You have a seizure. Summary  A headache is pain or discomfort felt around the head or neck area.  There are many causes and types of headaches. In some cases, the cause may not be found.  Keep a headache journal to help find out what  may trigger your headaches. Watch your condition for any changes. Let your health care provider know about them.  Contact a health care provider if you have a headache that is different from the usual headache, or if your symptoms are not helped by medicine.  Get help right away if your headache becomes severe, you vomit, you have a loss of vision, you lose your balance, or you have a seizure. This information is not intended to replace advice given to you by your health care provider. Make sure you discuss any questions you have with your health care provider. Document Revised: 08/09/2017 Document Reviewed: 08/09/2017 Elsevier Patient Education  [place]. Ketorolac injection What is this medicine? KETOROLAC (kee toe ROLE ak) is a non-steroidal anti-inflammatory drug (NSAID). It is used to treat moderate to severe pain for up to 5 days. It is commonly used after surgery. This medicine should not be used for more than 5 days. This medicine may be used for other purposes; ask your health care provider or pharmacist if you have questions. COMMON BRAND NAME(S): Toradol What should I tell my health care provider before I take this medicine? They need to know if you have any of these conditions:  asthma, especially aspirin-sensitive asthma  bleeding problems  coronary artery bypass graft (CABG) surgery within the past 2 weeks  kidney disease  stomach bleed, ulcer, or other problem  taking aspirin, other NSAID, or probenecid  an unusual or allergic reaction to ketorolac, tromethamine, aspirin, other NSAIDs, other medicines, foods, dyes or preservatives  pregnant or trying to get pregnant  breast-feeding How should I use this medicine? This medicine is for injection into a muscle or into a vein. It is given by a health care professional in a hospital or clinic setting. Talk to your pediatrician regarding the use of this medicine in children. Special care may be needed. Patients  over 64 years old may have a stronger reaction and need a smaller dose. Overdosage: If you think you have taken too much of this medicine contact a poison control center or emergency room at once. NOTE: This medicine is only for you. Do not share this medicine with others. What if I miss a dose? This does not apply. What may interact with this medicine? Do not take this medicine with any of the following medications:  aspirin and aspirin-like medicines  cidofovir  methotrexate  NSAIDs, medicines for pain and inflammation, like ibuprofen or naproxen  pentoxifylline  probenecid This medicine may also interact with the following medications:  alcohol  alendronate  alprazolam  carbamazepine  diuretics  flavocoxid  fluoxetine  ginkgo  lithium  medicines for blood pressure like enalapril  medicines that affect platelets like pentoxifylline  medicines that treat or prevent blood clots like heparin, warfarin  muscle relaxants  pemetrexed  phenytoin  thiothixene This list may not describe all possible interactions. Give your health care provider a list of all the medicines, herbs, non-prescription drugs, or dietary supplements you use. Also tell them if you smoke, drink alcohol, or use illegal drugs. Some items may interact with your medicine. What should I watch for while using this medicine? Tell your doctor or healthcare provider if your symptoms do not start to get better or if they get worse. This medicine may cause serious skin reactions. They can happen weeks to months after starting the medicine. Contact your healthcare provider right away if you notice fevers or flu-like symptoms with a rash. The rash may be red or purple and then turn into blisters or peeling of the skin. Or, you might notice a red rash with swelling of the face, lips or lymph nodes in your neck or under your arms. This medicine does not prevent heart attack or stroke. In fact, this medicine  may increase the chance of a heart attack or stroke. The chance may increase with longer use of this medicine and in people who have heart disease. If you take aspirin to prevent heart attack or stroke, talk with your doctor or healthcare provider. Do not take medicines such as ibuprofen and naproxen with this medicine. Side effects such as stomach upset, nausea, or ulcers may be more likely to occur. Many medicines available without a prescription should not be taken with this medicine. This medicine can cause ulcers and bleeding in the stomach and intestines at any time during treatment. Do not smoke cigarettes or drink alcohol. These increase irritation to your stomach and can make it more susceptible to damage from this medicine. Ulcers and bleeding can happen without warning symptoms and can cause death. This medicine can cause you to bleed more easily. Try to avoid damage to your teeth and gums when you brush or floss your teeth. What side effects may I notice from receiving this medicine? Side effects that you should report to your doctor or health care professional as soon as possible:  allergic reactions like skin rash, itching or hives, swelling of the face, lips, or tongue  breathing problems  high blood pressure  nausea, vomiting  redness, blistering, peeling or loosening of the skin, including inside the mouth  severe stomach pain  signs and symptoms of bleeding such as bloody or black, tarry stools; red or dark-brown urine; spitting up blood or brown material that looks like coffee grounds; red spots on the skin; unusual bruising or bleeding from the eye, gums, or nose  signs and symptoms of a blood clot changes in vision; chest pain; severe, sudden headache; trouble speaking; sudden numbness or weakness of the face, arm, or leg  trouble passing urine or change in the amount of urine  unexplained weight gain or swelling  unusually weak or tired  yellowing of eyes or  skin Side effects that usually do not require medical attention (report to your doctor or health care professional if they continue or are  bothersome):  diarrhea  dizziness  headache  heartburn This list may not describe all possible side effects. Call your doctor for medical advice about side effects. You may report side effects to FDA at 1-800-FDA-1088. Where should I keep my medicine? This drug is given in a hospital or clinic and will not be stored at home. NOTE: This sheet is a summary. It may not cover all possible information. If you have questions about this medicine, talk to your doctor, pharmacist, or health care provider.  2020 Elsevier/Gold Standard (2018-04-06 15:10:53)

## 2019-10-20 NOTE — Progress Notes (Signed)
Patient [facility]   Established Patient Office Visit  Subjective:  Patient ID: Tina Watkins, female    DOB: 06-Jan-1966  Age: 54 y.o. MRN: [id number]  CC:  Chief Complaint  Patient presents with  . Follow-up    Pt states she has concerns about her homehealth referral.  . Headache    x17month sporadic    HPI Tina Watkins is a 54 year old female who presents for Follow Up today.   Patient Active Problem List   Diagnosis Date Noted  . Anemia 09/02/2019  . Decubitus ulcer of sacral region, stage 1 08/18/2019  . HCAP (healthcare-associated pneumonia) 08/05/2019  . Pressure injury of skin 07/24/2019  . Chest congestion 07/24/2019  . Itching 07/24/2019  . Weakness 06/13/2019  . Pneumonia due to COVID-19 virus 05/09/2019  . Hypotension 05/09/2019  . Symptomatic anemia 05/09/2019  . CHF (congestive heart failure) ([place]) 05/09/2019  . Swollen abdomen 01/04/2019  . DDD (degenerative disc disease), cervical 12/06/2018  . Neuropathy 12/06/2018  . Paresthesia 10/11/2018  . Neck pain 10/11/2018  . Tobacco abuse 11/20/2017  . Right leg swelling 11/20/2017  . CKD (chronic kidney disease), stage III 11/20/2017  . Right leg pain 11/20/2017  . Stroke (cerebrum) ([place]) 11/20/2017  . GERD (gastroesophageal reflux disease) 11/20/2017  . Acute venous embolism and thrombosis of deep vessels of proximal lower extremity, right ([place]) 11/20/2017  . 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 Thrush, oral   . AKI (acute kidney injury) ([place])   . Multifocal pneumonia 08/09/2017  . Chest pain 09/29/2015  . Cervical stenosis of spinal canal 01/08/2015  . Urinary tract infectious disease   . Muscle spasms of neck 06/15/2014  . Bleeding hemorrhoid 06/15/2014  . Anemia in chronic kidney disease 06/15/2014  . Pyelonephritis, acute 06/13/2014  . Sepsis ([place]) 06/13/2014  . History of kidney  transplant   . Chronic pain syndrome 04/09/2012  . Dehydration, mild 04/09/2012  . Gout attack 06/23/2011  . Herpes infection 06/23/2011  . Anxiety 06/23/2011  . E coli bacteremia 06/18/2011  . ESRD (end stage renal disease) ([place]) 06/12/2011  . UTI (lower urinary tract infection) 06/12/2011  . Bacteremia due to Gram-negative bacteria 05/23/2011  . History of renal transplantation 05/22/2011  . Septic shock(785.52) 05/22/2011  . Acute on chronic kidney failure ([place]) 05/22/2011  . Gastroparesis 04/24/2008  . WEIGHT LOSS 08/24/2007  . NAUSEA AND VOMITING 08/24/2007   Current Status: Since her last office visit, she is doing well with no complaints. She continues to follow up with her dialysis treatments on days Tues, Thurs, and Sats as prescribed. She states that she has had to have catheter replaced for the 3rd time. She is accompanied by her uncle today. She denies fevers, chills, fatigue, recent infections, weight loss, and night sweats. She has not had any headaches, visual changes, dizziness, and falls. No chest pain, heart palpitations, cough and shortness of breath reported. Denies GI problems such as nausea, vomiting, diarrhea, and constipation. She has no reports of blood in stools, dysuria and hematuria. No depression or anxiety reports. She is taking all medications as prescribed.  Past Medical History:  Diagnosis Date  . Bacteremia due to Gram-negative bacteria 05/23/2011  . Blind    right eye  . Blind right eye   . CHF (congestive heart failure) ([place])   . Chronic lower back pain   . Complication of anesthesia    "  woke up during OR; I have an extremely high tolerance" (12/11/2011) 1 procedure was graft; the other procedures were procedures that are typically done with sedation.  . DDD (degenerative disc disease), cervical   . Depression   . Dysrhythmia    "tachycardia" (12/11/2011) new onset afib 10/15/14 EKG  . E coli bacteremia 06/18/2011  . Elevated LDL cholesterol level  12/2018  . ESRD (end stage renal disease) ([place]) 06/12/2011  . Fibromyalgia   . Gastroparesis   . Gastroparesis   . GERD (gastroesophageal reflux disease)   . Glaucoma    right eye  . Gout   . H/O carpal tunnel syndrome   . Headache(784.0)    "not often anymore" (12/11/2011)  . Herpes genitalia 1994  . History of blood transfusion    "more than a few times" (12/11/2011)  . History of stomach ulcers   . Hypotension   . Iron deficiency anemia   . New onset a-fib ([place])    10/15/14 EKG  . Osteopenia   . Peripheral neuropathy 11/2018  . Pressure ulcer of sacral region, stage 1 07/2019  . Seizures ([place]) 1994   "post transplant; only have had that one" (12/11/2011)  . Spinal stenosis in cervical region   . Stroke [facility])     left basal ganglia lacunar infarct; Right frontal lobe lacunar infarct.  . Stroke [facility]) ~ 1999; 2001   "briefly lost my vision; lost my right eye" (12/11/2011)  . Vitamin D deficiency 12/2018    Past Surgical History:  Procedure Laterality Date  . ANTERIOR CERVICAL DECOMP/DISCECTOMY FUSION N/A 01/08/2015   Procedure: Anterior Cervical Three-Four/Four-Five Decompression/Diskectomy/Fusion;  Surgeon: Leeroy Cha, MD;  Location: [place] NEURO ORS;  Service: Neurosurgery;  Laterality: N/A;  C3-4 C4-5 Anterior cervical decompression/diskectomy/fusion  . APPENDECTOMY  ~ 2004  . CATARACT EXTRACTION     right eye  . COLONOSCOPY    . ENUCLEATION  2001   "right"  . ESOPHAGOGASTRODUODENOSCOPY (EGD) WITH PROPOFOL N/A 04/21/2012   Procedure: ESOPHAGOGASTRODUODENOSCOPY (EGD) WITH PROPOFOL;  Surgeon: Milus Banister, MD;  Location: [facility] ENDOSCOPY;  Service: Endoscopy;  Laterality: N/A;  . 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 IR FLUORO GUIDE CV LINE RIGHT  07/11/2019  . IR FLUORO GUIDE CV LINE RIGHT  10/20/2019  . IR RADIOLOGY PERIPHERAL GUIDED IV START  07/11/2019  . IR US GUIDE VASC ACCESS RIGHT  07/11/2019  . IR US GUIDE  VASC ACCESS RIGHT  07/11/2019  . IR US GUIDE VASC ACCESS RIGHT  07/11/2019  . [place]; 1999; 2005   "right"  . MULTIPLE TOOTH EXTRACTIONS    . RIGHT HEART CATH N/A 03/23/2019   Procedure: RIGHT HEART CATH;  Surgeon: Jolaine Artist, MD;  Location: [place] CV LAB;  Service: Cardiovascular;  Laterality: N/A;  . TONSILLECTOMY    . TOTAL NEPHRECTOMY  1988?; 1994; 2005    Family History  Problem Relation Age of Onset  . Glaucoma Mother   . Pancreatic cancer Father   . Multiple sclerosis Brother   . Hypertension Maternal Grandmother   . Breast cancer Neg Hx     Social History   Socioeconomic History  . Marital status: Single    Spouse name: Not on file  . Number of children: 0  . Years of education: some college  . Highest education level: Not on file  Occupational History  . Occupation: disabled  Employer: UNEMPLOYED  Tobacco Use  . Smoking status: Current Every Day Smoker    Packs/day: 0.50    Years: 35.00    Pack years: 17.50    Types: Cigarettes  . Smokeless tobacco: Never Used  . Tobacco comment: Patient stated she last smoked in February 2021, which was the first time she was hospitalized this year. She has been in and out of the hospital several times since then,   Vaping Use  . Vaping Use: Never used  Substance and Sexual Activity  . Alcohol use: Yes    Comment: rare  . Drug use: Yes    Types: Marijuana    Comment: occasional use  . Sexual activity: Not Currently  Other Topics Concern  . Not on file  Social History Narrative   Right-handed.   Four cups caffeine per day.   Lives alone.   Social Determinants of Health   Financial Resource Strain:   . Difficulty of Paying Living Expenses: Not on file  Food Insecurity:   . Worried About Charity fundraiser in the Last Year: Not on file  . Ran Out of Food in the Last Year: Not on file  Transportation Needs:   . Lack of Transportation (Medical): Not on file  . Lack of Transportation  (Non-Medical): Not on file  Physical Activity:   . Days of Exercise per Week: Not on file  . Minutes of Exercise per Session: Not on file  Stress:   . Feeling of Stress : Not on file  Social Connections:   . Frequency of Communication with Friends and Family: Not on file  . Frequency of Social Gatherings with Friends and Family: Not on file  . Attends Religious Services: Not on file  . Active Member of Clubs or Organizations: Not on file  . Attends Archivist Meetings: Not on file  . Marital Status: Not on file  Intimate Partner Violence:   . Fear of Current or Ex-Partner: Not on file  . Emotionally Abused: Not on file  . Physically Abused: Not on file  . Sexually Abused: Not on file    Outpatient Medications Prior to Visit  Medication Sig Dispense Refill  . allopurinol (ZYLOPRIM) 100 MG tablet Take 1 tablet (100 mg total) by mouth daily. 30 tablet 0  . aspirin EC 81 MG tablet Take 81 mg by mouth daily.     . colchicine 0.6 MG tablet Take 0.5 tablets (0.3 mg total) by mouth daily. 30 tablet 0  . diphenhydrAMINE (BENADRYL) 25 mg capsule Take 1 capsule (25 mg total) by mouth every 8 (eight) hours as needed. (Patient taking differently: Take 25 mg by mouth every 8 (eight) hours as needed for itching. ) 30 capsule 6  . famotidine (PEPCID) 20 MG tablet Take 1 tablet (20 mg total) by mouth 2 (two) times daily. 60 tablet 11  . gabapentin (NEURONTIN) 100 MG capsule Take 100 mg by mouth 2 (two) times daily.     [name] latanoprost (XALATAN) 0.005 % ophthalmic solution Place 1 drop into the left eye at bedtime.  3  . lidocaine (LIDODERM) 5 % Place 1 patch onto the skin daily as needed (for pain- remove old patch first).     . Methoxy PEG-Epoetin Beta (MIRCERA IJ) Mircera    . midodrine (PROAMATINE) 10 MG tablet Take 10 mg by mouth 3 (three) times daily.    . ondansetron (ZOFRAN-ODT) 4 MG disintegrating tablet Take 1 tablet (4 mg total) by mouth every 8 (  eight) hours as needed for nausea or  vomiting. 20 tablet 11  . oxyCODONE-acetaminophen (PERCOCET) 10-325 MG tablet Take 1 tablet by mouth 4 (four) times daily as needed.     . tacrolimus (PROGRAF) 1 MG capsule Take 2-3 mg by mouth See admin instructions. Take 3 mg by mouth in the morning and 2 mg at bedtime    . Vitamin D, Ergocalciferol, (DRISDOL) 1.25 MG (50000 UNIT) CAPS capsule Take 50,000 Units by mouth every Sunday.     . zolpidem (AMBIEN) 10 MG tablet Take 1 tablet (10 mg total) by mouth at bedtime as needed for sleep. 10 tablet 0  . HYDROmorphone (DILAUDID) 2 MG tablet Take 0.5 tablets (1 mg total) by mouth every 6 (six) hours as needed for severe pain. (Patient not taking: Reported on 10/20/2019) 10 tablet 0   Facility-Administered Medications Prior to Visit  Medication Dose Route Frequency Provider Last Rate Last Admin  . ceFAZolin (ANCEF) 2-4 GM/100ML-% IVPB           . ceFAZolin (ANCEF) IVPB 2g/100 mL premix  2 g Intravenous to Edward Jolly, PA-C      . chlorhexidine (HIBICLENS) 4 % liquid           . chlorhexidine (HIBICLENS) 4 % liquid    PRN Sandi Mariscal, MD   1 application at 62/70/35 1236  . fentaNYL (SUBLIMAZE) 100 MCG/2ML injection           . heparin sodium (porcine) 1000 UNIT/ML injection           . iohexol (OMNIPAQUE) 300 MG/ML solution 50 mL  50 mL Intravenous Once PRN Sandi Mariscal, MD      . lidocaine (XYLOCAINE) 1 % (with pres) injection           . lidocaine (XYLOCAINE) 1 % (with pres) injection    PRN Sandi Mariscal, MD   20 mL at 10/20/19 1235    Allergies  Allergen Reactions  . Levofloxacin Itching and Rash  . Tape Other (See Comments)    "Certain surgical tapes peel off my skin"    ROS Review of Systems  Constitutional: Negative.   HENT: Negative.   Eyes: Negative.   Respiratory: Negative.   Cardiovascular: Negative.   Gastrointestinal: Negative.   Endocrine: Negative.   Genitourinary: Negative.        Dialysis treatments  Musculoskeletal: Positive for arthralgias (generalized joint  pain).  Skin: Negative.   Allergic/Immunologic: Negative.   Neurological: Positive for dizziness (occasional ), weakness (generalized) and headaches (occasional ).  Hematological: Negative.   Psychiatric/Behavioral: Negative.    Objective:    Physical Exam Vitals and nursing note reviewed.  Constitutional:      Appearance: She is well-developed.  HENT:     Head: Normocephalic and atraumatic.     Mouth/Throat:     Mouth: Mucous membranes are moist.     Pharynx: Oropharynx is clear.  Cardiovascular:     Rate and Rhythm: Normal rate and regular rhythm.     Pulses: Normal pulses.     Heart sounds: Normal heart sounds.  Pulmonary:     Effort: Pulmonary effort is normal.     Breath sounds: Normal breath sounds.  Abdominal:     General: Bowel sounds are normal.     Palpations: Abdomen is soft.  Musculoskeletal:        General: Normal range of motion.     Cervical back: Normal range of motion and neck supple.  Skin:  General: Skin is warm and dry.  Neurological:     General: No focal deficit present.     Mental Status: She is alert and oriented to person, place, and time.  Psychiatric:        Mood and Affect: Mood normal.        Speech: Speech normal.        Behavior: Behavior normal.    BP (!) 110/59 (BP Location: Left Arm, Patient Position: Sitting, Cuff Size: Normal)   Pulse 64   Temp (!) 97.2 F (36.2 C)   Ht 4\' 11"  (1.499 m)   Wt 114 lb 3.2 oz (51.8 kg)   SpO2 97%   BMI 23.07 kg/m  Wt Readings from Last 3 Encounters:  10/20/19 114 lb 3.2 oz (51.8 kg)  10/20/19 114 lb (51.7 kg)  10/13/19 111 lb 4.8 oz (50.5 kg)     Health Maintenance Due  Topic Date Due  . COVID-19 Vaccine (1) Never done  . TETANUS/TDAP  Never done  . PAP SMEAR-Modifier  Never done  . COLONOSCOPY  Never done  . INFLUENZA VACCINE  Never done    There are no preventive care reminders to display for this patient.  Lab Results  Component Value Date   TSH 2.469 09/15/2019   Lab  Results  Component Value Date   WBC 5.3 10/13/2019   HGB 10.1 (L) 10/13/2019   HCT 33.5 (L) 10/13/2019   MCV 90.5 10/13/2019   PLT 170 10/13/2019   Lab Results  Component Value Date   NA 138 10/13/2019   K 4.9 10/13/2019   CO2 33 (H) 10/13/2019   GLUCOSE 91 10/13/2019   BUN 26 (H) 10/13/2019   CREATININE 4.30 (HH) 10/13/2019   BILITOT 0.7 10/13/2019   ALKPHOS 247 (H) 10/13/2019   AST 44 (H) 10/13/2019   ALT 34 10/13/2019   PROT 7.0 10/13/2019   ALBUMIN 3.6 10/13/2019   CALCIUM 9.9 10/13/2019   ANIONGAP 8 10/13/2019   Lab Results  Component Value Date   CHOL 214 (H) 12/06/2018   Lab Results  Component Value Date   HDL 89 12/06/2018   Lab Results  Component Value Date   LDLCALC 105 (H) 12/06/2018   Lab Results  Component Value Date   TRIG 118 12/06/2018   Lab Results  Component Value Date   CHOLHDL 2.4 12/06/2018   Lab Results  Component Value Date   HGBA1C 4.8 05/08/2019    Assessment & Plan:   1. Status post placement of arteriovenous graft - ketorolac (TORADOL) 30 MG/ML injection 30 mg  2. Pain  3. ESRD (end stage renal disease) on dialysis ([place])  4. Nonintractable headache, unspecified chronicity pattern, unspecified headache type - topiramate (TOPAMAX) 50 MG tablet; Take 1 tablet (50 mg total) by mouth 2 (two) times daily.  Dispense: 60 tablet; Refill: 3  5. Weakness She ambulates via wheelchair.   6. Congestive heart failure, unspecified HF chronicity, unspecified heart failure type ([place]) Stable. No signs or symptoms of respiratory distress noted or reported.  7. DDD (degenerative disc disease), cervical  8. Follow up She will follow up in 3 months.   Meds ordered this encounter  Medications  . ketorolac (TORADOL) 30 MG/ML injection 30 mg  . topiramate (TOPAMAX) 50 MG tablet    Sig: Take 1 tablet (50 mg total) by mouth 2 (two) times daily.    Dispense:  60 tablet    Refill:  3    No orders of the defined types  were placed in this  encounter.   Referral Orders  No referral(s) requested today    Kathe Becton,  MSN, FNP-BC [address] [place], [place] [address] [phone] fax  Problem List Items Addressed This Visit      Cardiovascular and Mediastinum   CHF (congestive heart failure) ([place])     Musculoskeletal and Integument   DDD (degenerative disc disease), cervical   Relevant Medications   ketorolac (TORADOL) 30 MG/ML injection 30 mg (Start on 10/20/2019  3:30 PM)     Other   Weakness    Other Visit Diagnoses    Status post placement of arteriovenous graft    -  Primary   Relevant Medications   ketorolac (TORADOL) 30 MG/ML injection 30 mg (Start on 10/20/2019  3:30 PM)   Pain       ESRD (end stage renal disease) on dialysis ([place])       Nonintractable headache, unspecified chronicity pattern, unspecified headache type       Relevant Medications   ketorolac (TORADOL) 30 MG/ML injection 30 mg (Start on 10/20/2019  3:30 PM)   topiramate (TOPAMAX) 50 MG tablet   Follow up          Meds ordered this encounter  Medications  . ketorolac (TORADOL) 30 MG/ML injection 30 mg  . topiramate (TOPAMAX) 50 MG tablet    Sig: Take 1 tablet (50 mg total) by mouth 2 (two) times daily.    Dispense:  60 tablet    Refill:  3    Follow-up: No follow-ups on file.    [name], FNP

## 2019-10-20 NOTE — Patient Instructions (Signed)
Medication Instructions:  No Changes *If you need a refill on your cardiac medications before your next appointment, please call your pharmacy*   Lab Work: No Labs Indicated If you have labs (blood work) drawn today and your tests are completely normal, you will receive your results only by: [name] MyChart Message (if you have MyChart) OR . A paper copy in the mail If you have any lab test that is abnormal or we need to change your treatment, we will call you to review the results.   Testing/Procedures: No Testing Indicated   Follow-Up: At [facility], you and your health needs are our priority.  As part of our continuing mission to provide you with exceptional heart care, we have created designated Provider Care Teams.  These Care Teams include your primary Cardiologist (physician) and Advanced Practice Providers (APPs -  Physician Assistants and Nurse Practitioners) who all work together to provide you with the care you need, when you need it.    Your next appointment:   3 month(s)  The format for your next appointment:   In Person  Provider:   Eleonore Chiquito, MD   Other Instructions [place] Hand out for High Sodium Foods to Avoid.

## 2019-10-20 NOTE — Procedures (Signed)
Pre-procedure Diagnosis: ESRD Post-procedure Diagnosis: Same   Contrast injection demonstrates complete occlusion of the intrahepatic segment of the IVC with filling of multiple hypertrophied retroperitoneal venous collaterals.  Successful replacement of a slightly shorter now 23 cm tip to cuff tunneled hemodialysis catheter with tips terminating within the mid aspect of the IVC.  The catheter is ready for immediate use.  EBL: Trace The catheter is ready for immediate use.   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  Rather, would recommend fluoroscopic guided exchange with attempt to treat through any suspected infection.  Ultimately, if this dialysis catheter proves non durable, the next (and potentially last) access target would be placement of a transhepatic dialysis catheter.  Ronny Bacon, MD Pager #: [phone]

## 2019-10-21 LAB — URIC ACID: Uric Acid: 7.3 mg/dL — ABNORMAL HIGH (ref 3.0–7.2)

## 2019-10-23 ENCOUNTER — Other Ambulatory Visit: Payer: Self-pay

## 2019-10-23 ENCOUNTER — Ambulatory Visit: Payer: Self-pay

## 2019-10-23 NOTE — Patient Instructions (Addendum)
Visit Information Ms. Dodge, it was a pleasure speaking with you today. I have collaborated with your PCP regarding your need for home health services. Just a reminder that another member of the Managed Medicaid Care Team will contact you to assist you with your needs for home health services.    Ms. Colcord was given information about Medicaid Managed Care team care coordination services and consented to engagement with the [facility] Managed Care team.   Goals Addressed              This Visit's Progress   .  "I want an aide to come to the home to help me a little bit with some light housework and meal preparation." (pt-stated)   Not on track     [place] (see longitudinal plan of care for additional care plan information)  Current Barriers:  . Over the next 30 days, patient will work with CM team to address in-home personal care needs. Patient requests additional assistance with ADL/IADL. Home health PT consult pending.  Clinical Social Work Clinical Goal(s):  [name] Over the next 30 days, patient will work with CM team to address needs related to in-home personal care needs.  Patient discharged from rehab in late August and is currently residing at home alone and receives no services. LCSW will collaborate with PCP to address her needs.  Interventions: . Inter-disciplinary care team collaboration (see longitudinal plan of care) . Patient interviewed and appropriate assessments performed . Provided education to patient/caregiver regarding level of care options. Patient may be eligible for [facility] services; will collaborate with home health PT regarding personal care needs.  Patient Self Care Activities:  . Patient will work with CM team to address personal care needs. . Licensed Clinical Social Worker will collaborate with MM care team and PCP office to address personal care needs.  Please see past updates related to this goal by clicking on the "Past Updates"  button in the selected goal      .  "I want to get some physical therapy when I am back at home. I want to be able to walk normal without any type of aid to assist me." (pt-stated)   Not on track     [place] (see longitudinal plan of care for additional care plan information)  Current Barriers:  . Care coordination needs related to physical mobility and therapy need . Patient discharged [facility] after short-term stay for rehab. Her facility SW states she was discharged with home health - unable to determine agency. Patient has not started services with an agency.  Clinical Social Work Clinical Goal(s):  [name] Over the next 14 days, patient will work with CM team to address needs related to home physical therapy.  Patient needs help her with her back pain.  Interventions: . Inter-disciplinary care team collaboration (see longitudinal plan of care) . Patient interviewed and appropriate assessments performed . Referred to [place] colleague for follow-up regarding home care needs.    Patient Self Care Activities:  . Patient will work with CM team over the next 30 days.   Please see past updates related to this goal by clicking on the "Past Updates" button in the selected goal         Patient verbalizes understanding of instructions provided today.   The Managed Medicaid care management team will reach out to the patient again over the next 30 days.  The patient has been  provided with contact information for the Managed Medicaid care management team and has been advised to call with any health related questions or concerns.    Netta Neat, MSW, LCSW Social Work Case Freight forwarder - [place]  Direct [place]: [phone]

## 2019-10-23 NOTE — Patient Outreach (Addendum)
Care Coordination- Social Work  10/23/2019  [name] 1965/08/07 [id number]  Subjective:    Tina Watkins is an 54 y.o. year old female who is a primary patient of [name], FNP.    Ms. Shetterly was given information about Medicaid Managed Care team care coordination services today. [name] agreed to services and verbal consent obtained  Review of patient status, laboratory and other test data was performed as part of evaluation for provision of services.  SDOH:   SDOH Screenings   Alcohol Screen: Low Risk   . Last Alcohol Screening Score (AUDIT): 1  Depression (PHQ2-9): Medium Risk  . PHQ-2 Score: 12  Financial Resource Strain: Low Risk   . Difficulty of Paying Living Expenses: Not very hard  Food Insecurity: No Food Insecurity  . Worried About Charity fundraiser in the Last Year: Never true  . Ran Out of Food in the Last Year: Never true  Housing: Low Risk   . Last Housing Risk Score: 0  Physical Activity: Insufficiently Active  . Days of Exercise per Week: 3 days  . Minutes of Exercise per Session: 10 min  Social Connections: Moderately Isolated  . Frequency of Communication with Friends and Family: More than three times a week  . Frequency of Social Gatherings with Friends and Family: More than three times a week  . Attends Religious Services: More than 4 times per year  . Active Member of Clubs or Organizations: No  . Attends Archivist Meetings: Never  . Marital Status: Never married  Stress: Stress Concern Present  . Feeling of Stress : To some extent  Tobacco Use: High Risk  . Smoking Tobacco Use: Current Every Day Smoker  . Smokeless Tobacco Use: Never Used  Transportation Needs: No Transportation Needs  . Lack of Transportation (Medical): No  . Lack of Transportation (Non-Medical): No   SDOH Interventions     Most Recent Value  SDOH Interventions  Food Insecurity Interventions Intervention Not  Indicated  Financial Strain Interventions Intervention Not Indicated  Housing Interventions Intervention Not Indicated  Intimate Partner Violence Interventions Intervention Not Indicated  Physical Activity Interventions Intervention Not Indicated  Stress Interventions Intervention Not Indicated  Social Connections Interventions Intervention Not Indicated  Transportation Interventions Intervention Not Indicated  Alcohol Brief Interventions/Follow-up AUDIT Score <7 follow-up not indicated  Depression Interventions/Treatment  Counseling, Patient refuses Treatment      Objective: to discuss patient's needs for home health.   Medications:  Medications Reviewed Today    Reviewed by Netta Neat, LCSW (Social Worker) on 10/23/19 at 72  Med List Status: <None>  Medication Order Taking? Sig Documenting Provider Last Dose Status Informant  allopurinol (ZYLOPRIM) 100 MG tablet [id number]  Take 1 tablet (100 mg total) by mouth daily. [name], Pratik D, DO  Expired 10/20/19 2359 Self  aspirin EC 81 MG tablet [id number] Yes Take 81 mg by mouth daily.  [provider] Taking Active Self  colchicine 0.6 MG tablet [id number] Yes Take 0.5 tablets (0.3 mg total) by mouth daily. [name], MD Taking Active Self           Med Note [name], Benjamine Mola A   Fri Sep 15, 2019  8:21 AM) Just takes when has gout flare  diphenhydrAMINE (BENADRYL) 25 mg capsule [id number] Yes Take 1 capsule (25 mg total) by mouth every 8 (eight) hours as needed.  Patient taking differently: Take 25 mg by mouth every 8 (eight) hours as needed for itching.  [name], FNP Taking Active Self  famotidine (PEPCID) 20 MG tablet [id number] Yes Take 1 tablet (20 mg total) by mouth 2 (two) times daily. [name], FNP Taking Active Self  gabapentin (NEURONTIN) 100 MG capsule [id number] Yes Take 100 mg by mouth 2 (two) times daily.  [provider] Taking Active Self  HYDROmorphone (DILAUDID) 2 MG tablet  [id number] No Take 0.5 tablets (1 mg total) by mouth every 6 (six) hours as needed for severe pain.  Patient not taking: Reported on 10/20/2019   [name], MD Not Taking Active   latanoprost (XALATAN) 0.005 % ophthalmic solution [id number] Yes Place 1 drop into the left eye at bedtime. [provider] Taking Active Self           Med Note [name] Apr 17, 2019  6:42 PM)    lidocaine (LIDODERM) 5 % [id number] Yes Place 1 patch onto the skin daily as needed (for pain- remove old patch first).  [provider] Taking Active Self  Methoxy PEG-Epoetin Angus Seller) [id number] Yes Mircera [provider] Taking Active   midodrine (PROAMATINE) 10 MG tablet [id number] Yes Take 10 mg by mouth 3 (three) times daily. [provider] Taking Active Self  ondansetron (ZOFRAN-ODT) 4 MG disintegrating tablet [id number] Yes Take 1 tablet (4 mg total) by mouth every 8 (eight) hours as needed for nausea or vomiting. [name], FNP Taking Active Self  oxyCODONE-acetaminophen (PERCOCET) 10-325 MG tablet [id number] Yes Take 1 tablet by mouth 4 (four) times daily as needed.  [provider] Taking Active   tacrolimus (PROGRAF) 1 MG capsule [id number] Yes Take 2-3 mg by mouth See admin instructions. Take 3 mg by mouth in the morning and 2 mg at bedtime [name], MD Taking Active Self           Med Note [name] Apr 17, 2019  6:42 PM)    topiramate (TOPAMAX) 50 MG tablet [id number]  Take 1 tablet (50 mg total) by mouth 2 (two) times daily. [name], FNP  Active   Vitamin D, Ergocalciferol, (DRISDOL) 1.25 MG (50000 UNIT) CAPS capsule [id number] Yes Take 50,000 Units by mouth every Sunday.  [provider] Taking Active Self  zolpidem (AMBIEN) 10 MG tablet [id number] No Take 1 tablet (10 mg total) by mouth at bedtime as needed for sleep.  Patient not taking: Reported on 10/23/2019   [name], DO Not Taking Active  Self          Fall/Depression Screening:  Fall Risk  10/20/2019 06/13/2019 05/08/2019  Falls in the past year? 0 0 0  Number falls in past yr: 0 0 0  Injury with Fall? 0 0 0   PHQ 2/9 Scores 10/23/2019 10/20/2019 06/26/2014  PHQ - 2 Score 4 3 6   PHQ- 9 Score 12 14 -    Assessment:  Goals Addressed              This Visit's Progress   .  "I want an aide to come to the home to help me a little bit with some light housework and meal preparation." (pt-stated)   Not on track     [place] (see longitudinal plan of care for additional care plan information)  Current Barriers:  . Over the next 30 days, patient will work with CM team to address in-home personal care needs. Patient requests additional  assistance with ADL/IADL. Home health PT consult pending.  Clinical Social Work Clinical Goal(s):  [name] Over the next 30 days, patient will work with CM team to address needs related to in-home personal care needs.  Patient discharged from rehab in late August and is currently residing at home alone and receives no services. LCSW will collaborate with PCP to address her needs.  Interventions: . Inter-disciplinary care team collaboration (see longitudinal plan of care) . Patient interviewed and appropriate assessments performed . Provided education to patient/caregiver regarding level of care options. Patient may be eligible for [facility] services; will collaborate with home health PT regarding personal care needs.  Patient Self Care Activities:  . Patient will work with CM team to address personal care needs. . Licensed Clinical Social Worker will collaborate with MM care team and PCP office to address personal care needs.  Please see past updates related to this goal by clicking on the "Past Updates" button in the selected goal      .  "I want to get some physical therapy when I am back at home. I want to be able to walk normal without any type of aid to assist me."  (pt-stated)   Not on track     [place] (see longitudinal plan of care for additional care plan information)  Current Barriers:  . Care coordination needs related to physical mobility and therapy need . Patient discharged from [facility] after short-term stay for rehab. Her facility SW states she was discharged with home health - unable to determine agency. Patient has not started services with an agency.  Clinical Social Work Clinical Goal(s):  [name] Over the next 14 days, patient will work with CM team to address needs related to home physical therapy.  Patient needs help her with her back pain.  Interventions: . Inter-disciplinary care team collaboration (see longitudinal plan of care) . Patient interviewed and appropriate assessments performed . Referred to [place] colleague for follow-up regarding home care needs.    Patient Self Care Activities:  . Patient will work with CM team over the next 30 days.   Please see past updates related to this goal by clicking on the "Past Updates" button in the selected goal         Plan: The MM Care Team will reach out to patient in 30 days.  LCSW will collaborate with PCP regarding patient's home health needs. LCSW will follow-up with patient again in 30 days -  appointment scheduled for November 22, 2019 at 11:00am.   Netta Neat, MSW, [place]: [phone]

## 2019-10-25 ENCOUNTER — Encounter: Payer: Self-pay | Admitting: Family Medicine

## 2019-10-27 IMAGING — CR DG CHEST 2V
2 series · 2 of 2 positions shown · non-contrast
Comparison: Chest x-ray dated July 18, 2017.

CLINICAL DATA: Left-sided chest pain with shortness of breath,
cough, and fever. Recent kidney transplant 1 week ago.

EXAM:
CHEST - 2 VIEW

[chest lat]
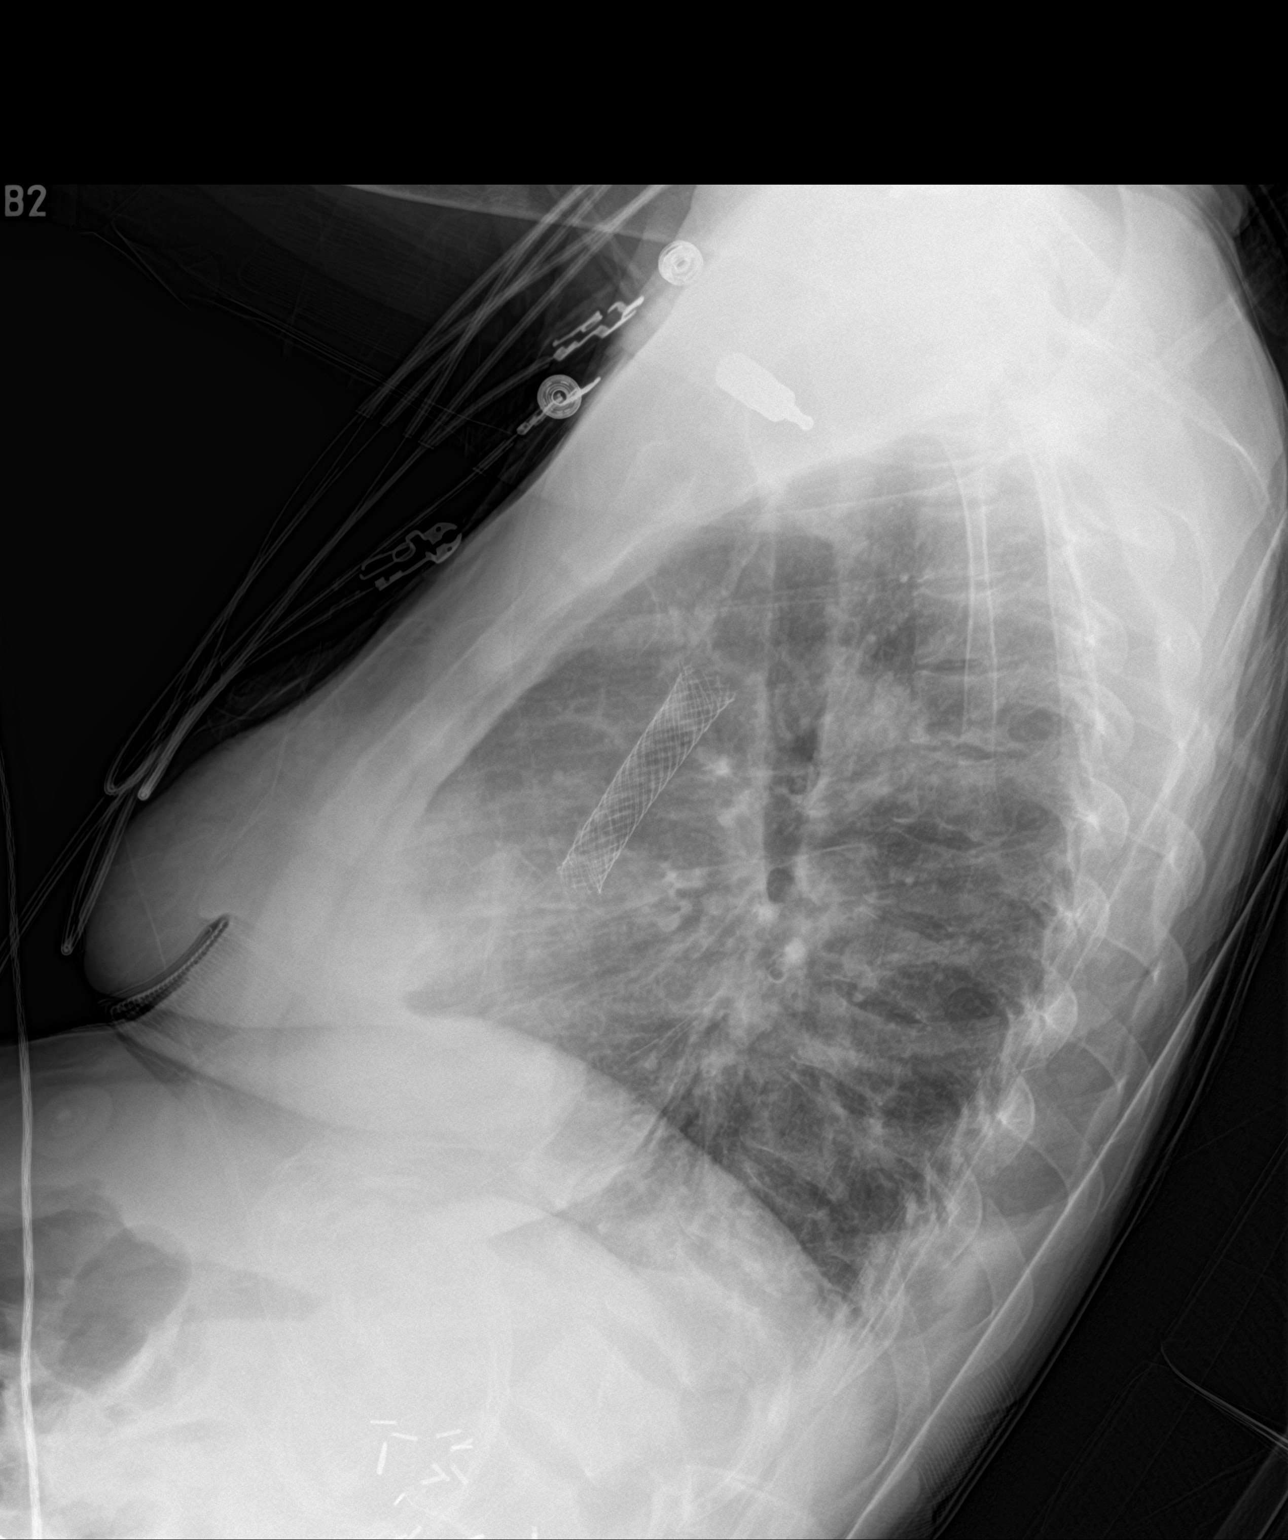

[chest ap]
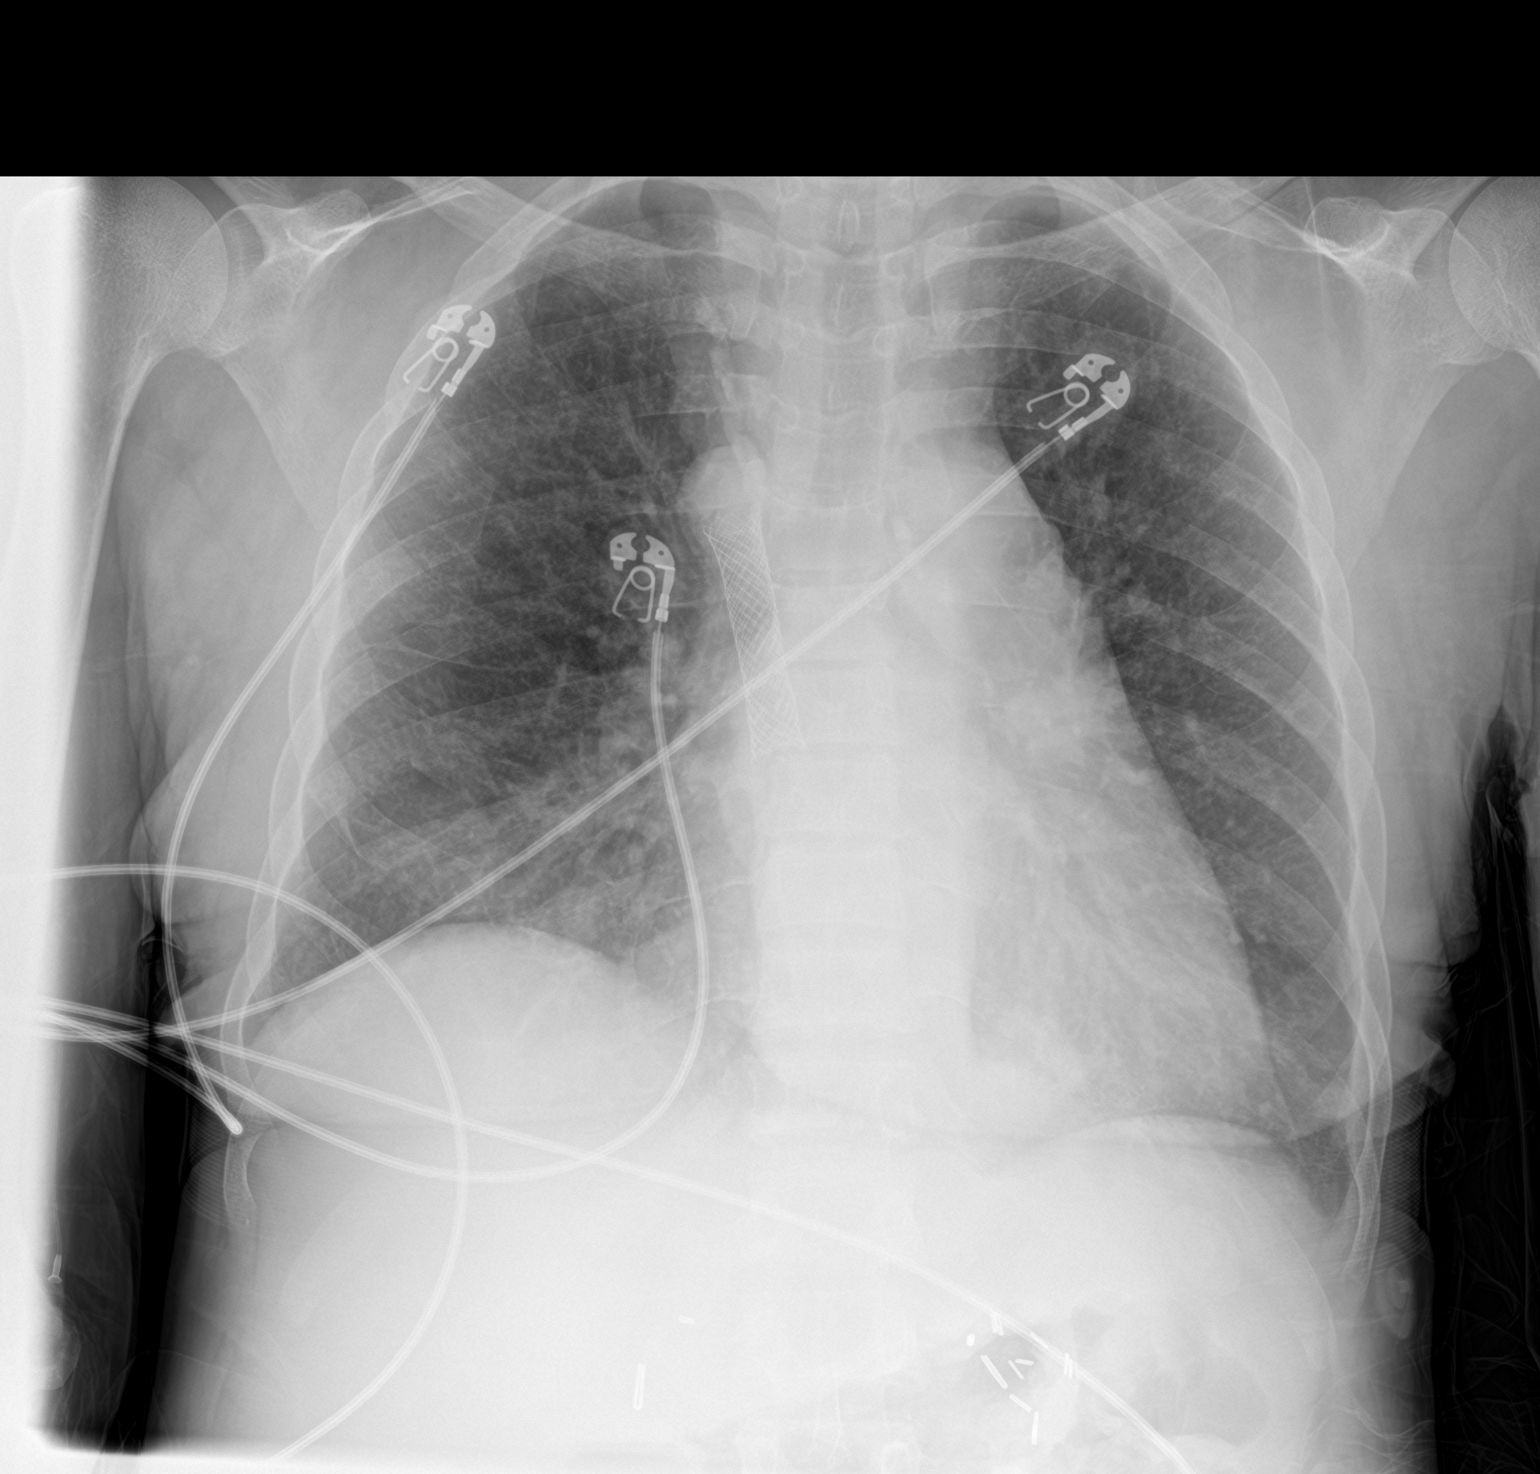

[2 of 2 positions shown; findings below may reference images not displayed]

FINDINGS: Stable mild cardiomegaly with unchanged SVC stent. Normal pulmonary
vascularity. Unchanged scarring in the right middle lobe. No focal
consolidation, pleural effusion, or pneumothorax. No acute osseous
abnormality.
IMPRESSION: No active cardiopulmonary disease.

## 2019-10-28 IMAGING — CT CT CHEST W/O CM
2 of 3 series · 15 of 36 positions shown, 18 images · non-contrast
Comparison: Visualized lung bases on CT abdomen and pelvis
yesterday. CT chest 10/24/2015.

CLINICAL DATA: 51-year-old with multiple medical problems including
end-stage renal disease and prior renal transplantation, presenting
now with sepsis, cough and hypoxia. CT abdomen and pelvis yesterday
demonstrated multilobar pneumonia in the visualized lungs. Current
smoker.

EXAM:
CT CHEST WITHOUT CONTRAST
TECHNIQUE: Multidetector CT imaging of the chest was performed following the
standard protocol without IV contrast.

[Series 4: thorax 2.0 · axial · 0.60mm/px · z∈[+1032,+1264]mm · 12 of 138 slices shown, 15 images]
[im 11/138  mediastinal]
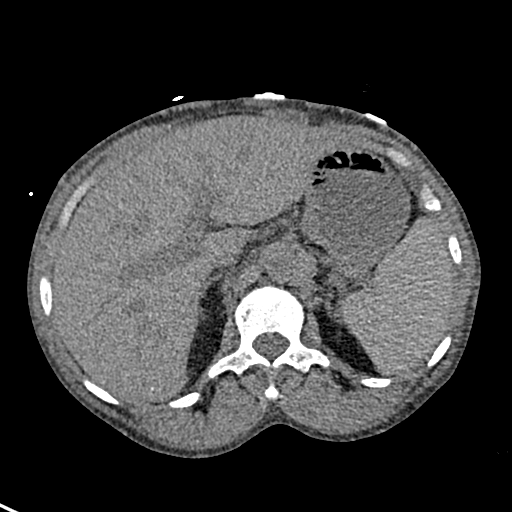
[im 11/138  lung]
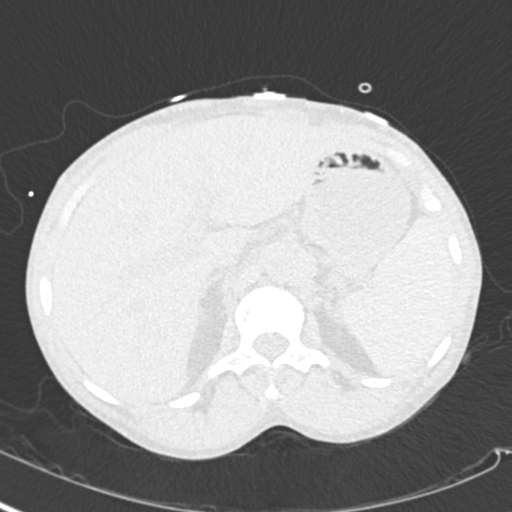
[im 21/138  lung]
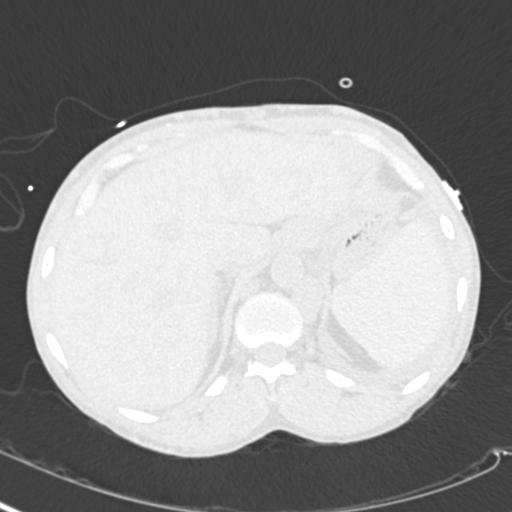
[im 31/138  lung]
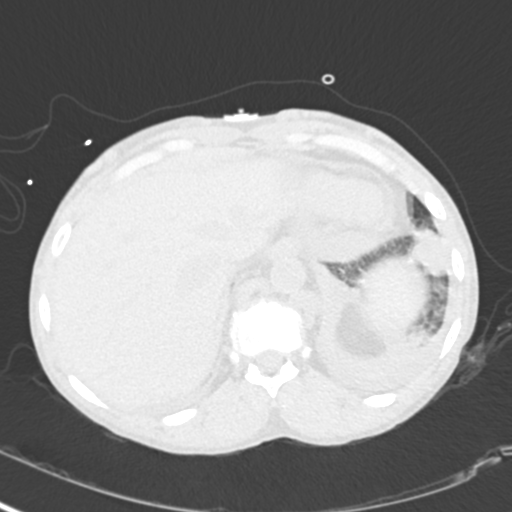
[im 41/138  lung]
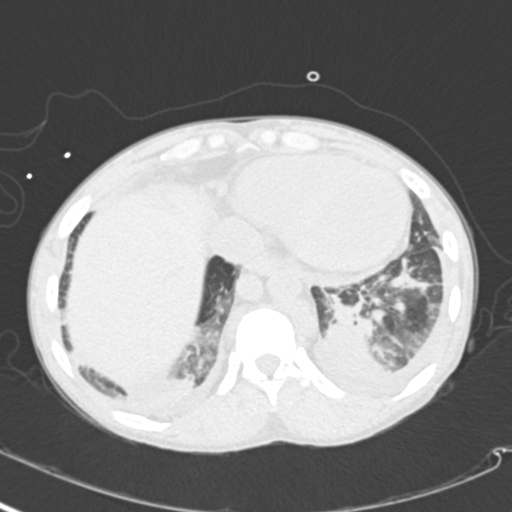
[im 51/138  mediastinal]
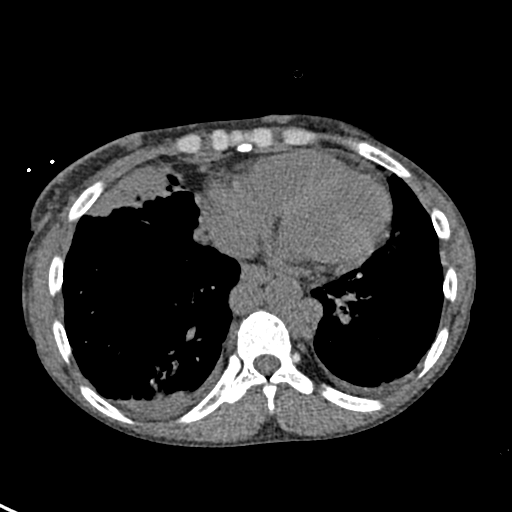
[im 51/138  lung]
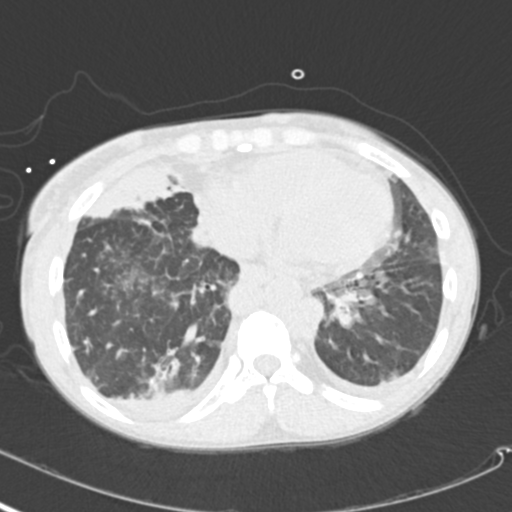
[im 61/138  lung]
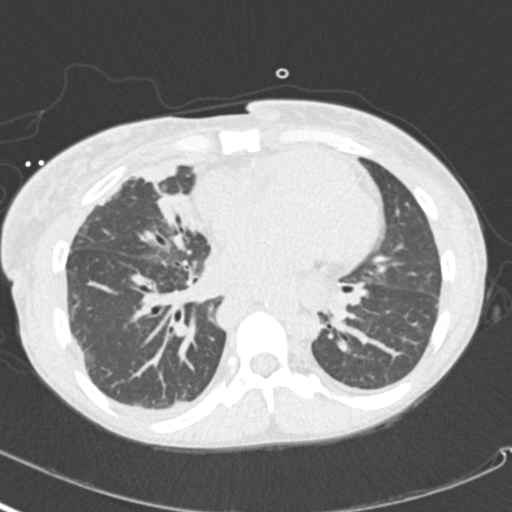
[im 77/138  lung]
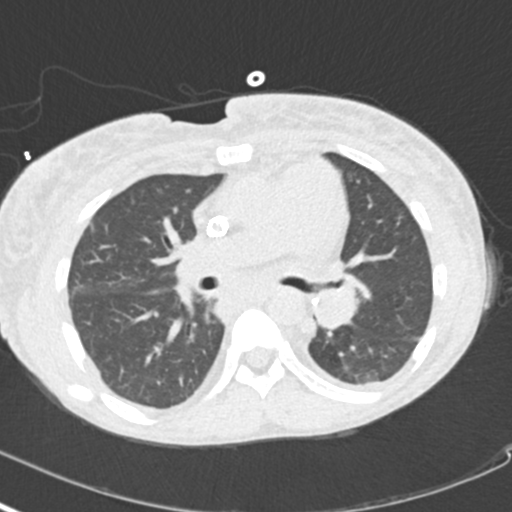
[im 87/138  lung]
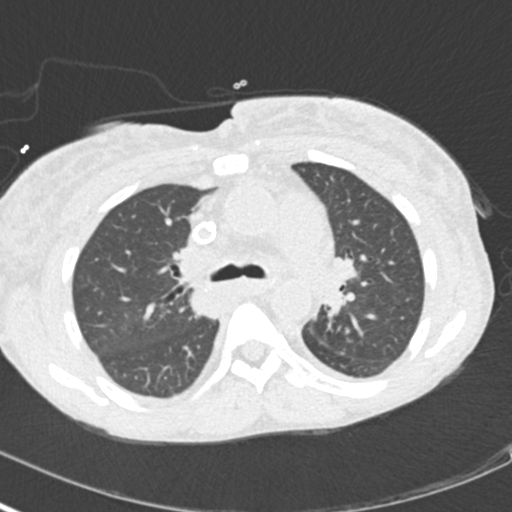
[im 97/138  mediastinal]
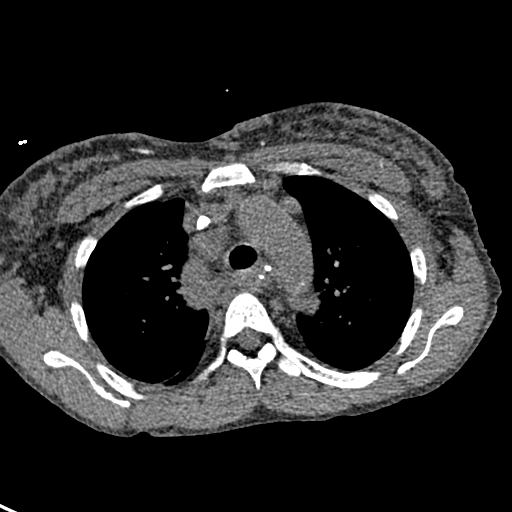
[im 97/138  lung]
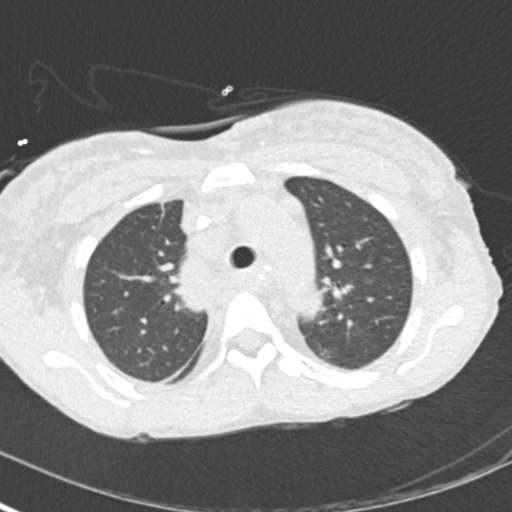
[im 107/138  lung]
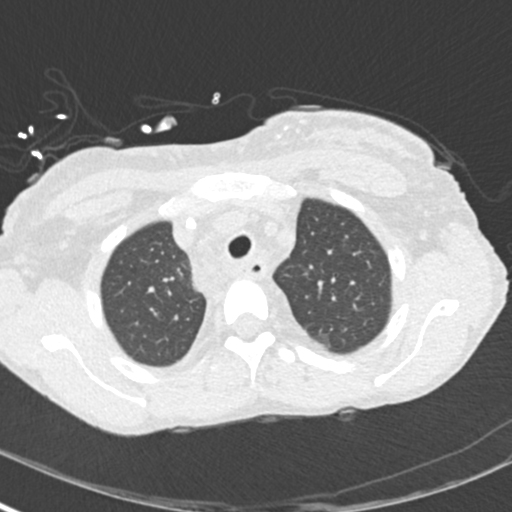
[im 117/138  lung]
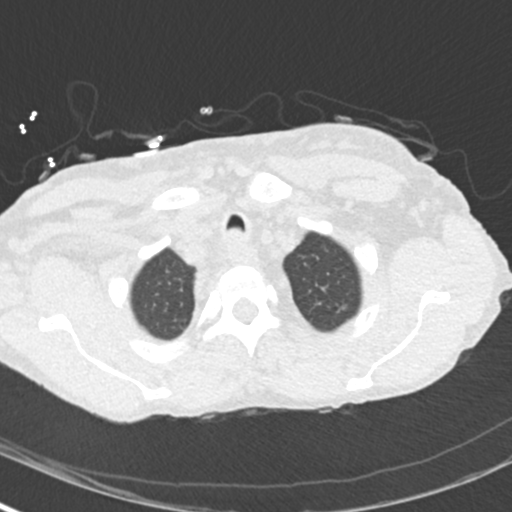
[im 127/138  lung]
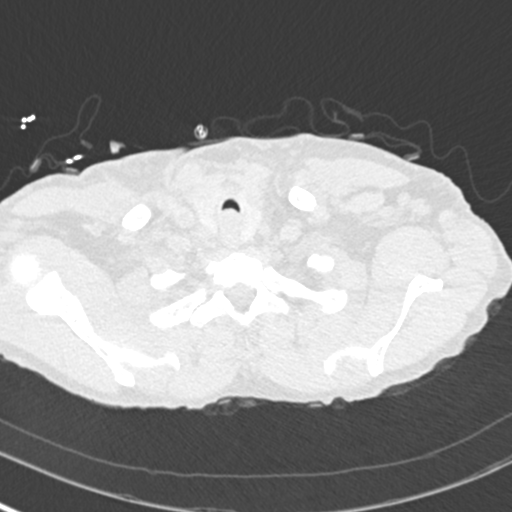

[Series 7: coronal · coronal · 0.56mm/px · 3 of 82 slices shown]
[im 17/82  lung]
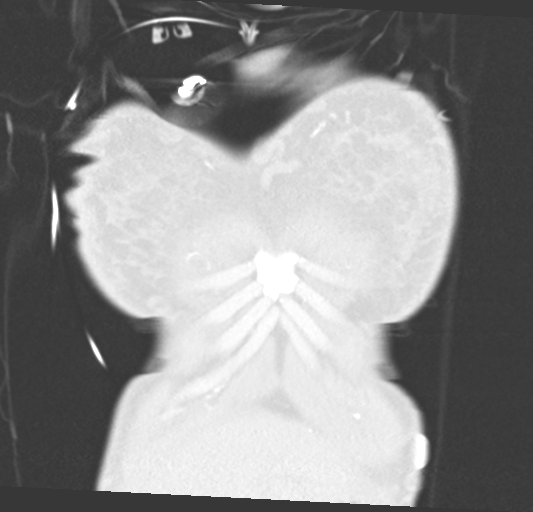
[im 33/82  lung]
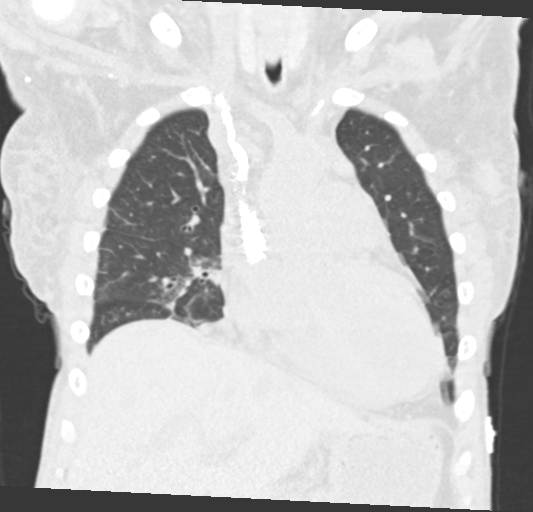
[im 49/82  lung]
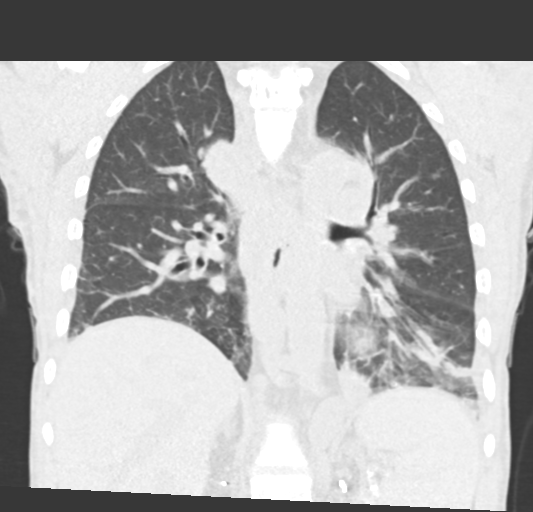

[15 of 36 positions shown; findings below may reference images not displayed]

FINDINGS: Cardiovascular: SVC stent unchanged in appearance since the 0403 CT.
Markedly dilated azygos and hemiazygous veins in the mediastinum,
unchanged, likely indicating chronic SVC occlusion. Heart size upper
normal to slightly enlarged, unchanged. Mild LAD and RIGHT coronary
artery distribution atherosclerosis. Small chronic pericardial
effusion, unchanged.

Moderate atherosclerosis involving the thoracic and proximal
abdominal aorta without evidence of aneurysm.

Mediastinum/Nodes: Scattered normal sized lymph nodes throughout the
mediastinum, many of which are calcified, unchanged. No new or
enlarging lymphadenopathy. Normal appearing esophagus.
Normal-appearing thyroid gland.

Lungs/Pleura: Confluent airspace consolidation with air bronchograms
in the RIGHT middle lobe, RIGHT lower lobe and LEFT lower lobe as
noted on yesterday's CT. BILATERAL UPPER lobes are clear. Central
airways patent with marked bronchial wall thickening involving the
RIGHT middle lobe and RIGHT lower lobe bronchi. Small BILATERAL
pleural effusions. Calcified granuloma deep in the POSTERIOR RIGHT
lower lobe as noted previously.

Upper Abdomen: Calcified granulomata involving the liver and spleen.
Surgical clips in the retroperitoneum from prior nephrectomy.
Markedly dilated azygos and hemiazygous veins in the retrocrural
space. Visualized upper abdomen otherwise unremarkable for the
unenhanced technique.

Musculoskeletal: Regional skeleton intact without acute or
significant osseous abnormality.
IMPRESSION: 1. Pneumonia involving the RIGHT middle lobe and BILATERAL lower
lobes.
2. Associated small BILATERAL pleural effusions.
3. Chronic SVC occlusion with markedly dilated azygos and
hemiazygous veins in the mediastinum and in the retrocrural space of
the upper abdomen.
4. Old granulomatous disease.

Aortic Atherosclerosis ([id number]).

## 2019-11-03 DIAGNOSIS — M549 Dorsalgia, unspecified: Secondary | ICD-10-CM | POA: Diagnosis not present

## 2019-11-03 DIAGNOSIS — M109 Gout, unspecified: Secondary | ICD-10-CM | POA: Diagnosis not present

## 2019-11-03 DIAGNOSIS — E782 Mixed hyperlipidemia: Secondary | ICD-10-CM | POA: Diagnosis not present

## 2019-11-03 DIAGNOSIS — G8929 Other chronic pain: Secondary | ICD-10-CM | POA: Diagnosis not present

## 2019-11-03 DIAGNOSIS — Z79899 Other long term (current) drug therapy: Secondary | ICD-10-CM | POA: Diagnosis not present

## 2019-11-03 DIAGNOSIS — M542 Cervicalgia: Secondary | ICD-10-CM | POA: Diagnosis not present

## 2019-11-06 ENCOUNTER — Other Ambulatory Visit: Payer: Self-pay | Admitting: Family Medicine

## 2019-11-06 DIAGNOSIS — Z1231 Encounter for screening mammogram for malignant neoplasm of breast: Secondary | ICD-10-CM

## 2019-11-08 DIAGNOSIS — N186 End stage renal disease: Secondary | ICD-10-CM | POA: Diagnosis not present

## 2019-11-08 DIAGNOSIS — I2510 Atherosclerotic heart disease of native coronary artery without angina pectoris: Secondary | ICD-10-CM | POA: Diagnosis not present

## 2019-11-08 DIAGNOSIS — Z4902 Encounter for fitting and adjustment of peritoneal dialysis catheter: Secondary | ICD-10-CM | POA: Diagnosis not present

## 2019-11-08 DIAGNOSIS — Z87891 Personal history of nicotine dependence: Secondary | ICD-10-CM | POA: Diagnosis not present

## 2019-11-08 DIAGNOSIS — T8612 Kidney transplant failure: Secondary | ICD-10-CM | POA: Diagnosis not present

## 2019-11-08 DIAGNOSIS — I639 Cerebral infarction, unspecified: Secondary | ICD-10-CM | POA: Diagnosis not present

## 2019-11-08 DIAGNOSIS — I824Y1 Acute embolism and thrombosis of unspecified deep veins of right proximal lower extremity: Secondary | ICD-10-CM | POA: Diagnosis not present

## 2019-11-08 DIAGNOSIS — Z8673 Personal history of transient ischemic attack (TIA), and cerebral infarction without residual deficits: Secondary | ICD-10-CM | POA: Diagnosis not present

## 2019-11-10 DIAGNOSIS — N186 End stage renal disease: Secondary | ICD-10-CM | POA: Diagnosis not present

## 2019-11-13 ENCOUNTER — Other Ambulatory Visit: Payer: Self-pay | Admitting: Family Medicine

## 2019-11-13 ENCOUNTER — Encounter: Payer: Self-pay | Admitting: Family Medicine

## 2019-11-13 DIAGNOSIS — R2681 Unsteadiness on feet: Secondary | ICD-10-CM

## 2019-11-13 DIAGNOSIS — M25562 Pain in left knee: Secondary | ICD-10-CM

## 2019-11-13 DIAGNOSIS — M25561 Pain in right knee: Secondary | ICD-10-CM

## 2019-11-14 ENCOUNTER — Encounter: Payer: Self-pay | Admitting: Obstetrics and Gynecology

## 2019-11-14 ENCOUNTER — Telehealth: Payer: Self-pay

## 2019-11-14 NOTE — Telephone Encounter (Signed)
   [facility] Pre-operative Risk Assessment    Request for surgical clearance:  1. What type of surgery is being performed? LAP HERNIA REPAIR-ADHESIONS   2. When is this surgery scheduled? ASAP-D/T SX  3. What type of clearance is required (medical clearance vs. Pharmacy clearance to hold med vs. Both)? MEDICAL  4. Are there any medications that need to be held prior to surgery and how long?NONE LISTED   5. Practice name and name of physician performing surgery? [facility] -GEN SURGERY    6. What is the office phone number? [phone]   7.   What is the office fax number? [phone]  8.   Anesthesia type (None, local, MAC, general) ? NOT LISTED-UNABLE TO GET-ON HOLD FOR >9MIN  COMORBIDITY NEEDING CLEARANCE:CARDIAC DIASTOLIC HEART FAILURE WOULD LIKE TO SCHEDULE ASAP D/T SX

## 2019-11-14 NOTE — Telephone Encounter (Signed)
Primary Cardiologist:[place] Doreatha Lew, MD  Chart reviewed as part of pre-operative protocol coverage. Because of Tina Watkins's past medical history and time since last visit, he/she will require a follow-up visit in order to better assess preoperative cardiovascular risk.  Pre-op covering staff: - Please schedule appointment and call patient to inform them. - Please contact requesting surgeon's office via preferred method (i.e, phone, fax) to inform them of need for appointment prior to surgery.  If applicable, this message will also be routed to pharmacy pool and/or primary cardiologist for input on holding anticoagulant/antiplatelet agent as requested below so that this information is available at time of patient's appointment.   Deberah Pelton, NP  11/14/2019, 2:18 PM

## 2019-11-14 NOTE — Telephone Encounter (Signed)
I tried to reach the pt to schedule a sooner appt for pre op clearance. Left message that I will schedule the pt in the 2 pm 11/23/19 with Dr. Audie Box, though I will need her to call back and confirm appt or change appt if needed.

## 2019-11-14 NOTE — Patient Outreach (Signed)
Care Coordination - Case Manager  11/14/2019  Tina Watkins April 19, 1965 [id number]  Subjective:  Tina Watkins is an 54 y.o. year old female who is a primary patient of [name], FNP.  Ms. Coke was given information about Medicaid Managed Care team care coordination services today. [name] agreed to services and verbal consent obtained  Review of patient status, laboratory and other test data was performed as part of evaluation for provision of services.  SDOH: SDOH Screenings   Alcohol Screen: Low Risk   . Last Alcohol Screening Score (AUDIT): 1  Depression (PHQ2-9): Medium Risk  . PHQ-2 Score: 12  Financial Resource Strain: Low Risk   . Difficulty of Paying Living Expenses: Not very hard  Food Insecurity: No Food Insecurity  . Worried About Charity fundraiser in the Last Year: Never true  . Ran Out of Food in the Last Year: Never true  Housing: Low Risk   . Last Housing Risk Score: 0  Physical Activity: Insufficiently Active  . Days of Exercise per Week: 3 days  . Minutes of Exercise per Session: 10 min  Social Connections: Moderately Isolated  . Frequency of Communication with Friends and Family: More than three times a week  . Frequency of Social Gatherings with Friends and Family: More than three times a week  . Attends Religious Services: More than 4 times per year  . Active Member of Clubs or Organizations: No  . Attends Archivist Meetings: Never  . Marital Status: Never married  Stress: Stress Concern Present  . Feeling of Stress : To some extent  Tobacco Use: High Risk  . Smoking Tobacco Use: Current Every Day Smoker  . Smokeless Tobacco Use: Never Used  Transportation Needs: No Transportation Needs  . Lack of Transportation (Medical): No  . Lack of Transportation (Non-Medical): No     Objective:    Allergies  Allergen Reactions  . Levofloxacin Itching and Rash  . Tape Other (See Comments)      "Certain surgical tapes peel off my skin"    Medications:    Medications Reviewed Today    Reviewed by Gayla Medicus, RN (Registered Nurse) on 11/14/19 at 61  Med List Status: <None>  Medication Order Taking? Sig Documenting Provider Last Dose Status Informant  allopurinol (ZYLOPRIM) 100 MG tablet [id number]  Take 1 tablet (100 mg total) by mouth daily. [name], Pratik D, DO  Expired 10/20/19 2359 Self  aspirin EC 81 MG tablet [id number]  Take 81 mg by mouth daily.  [provider]  Active Self  colchicine 0.6 MG tablet [id number]  Take 0.5 tablets (0.3 mg total) by mouth daily. [name], MD  Active Self           Med Note [name], ELIZABETH A   Fri Sep 15, 2019  8:21 AM) Just takes when has gout flare  diphenhydrAMINE (BENADRYL) 25 mg capsule [id number] Yes Take 1 capsule (25 mg total) by mouth every 8 (eight) hours as needed.  Patient taking differently: Take 25 mg by mouth every 8 (eight) hours as needed for itching.    [name], FNP Taking Active Self  famotidine (PEPCID) 20 MG tablet [id number] Yes Take 1 tablet (20 mg total) by mouth 2 (two) times daily. [name], FNP Taking Active Self  gabapentin (NEURONTIN) 100 MG capsule [id number] Yes Take 100 mg by mouth 2 (two) times daily.  [provider] Taking Active Self  HYDROmorphone (DILAUDID) 2  MG tablet [id number]  Take 0.5 tablets (1 mg total) by mouth every 6 (six) hours as needed for severe pain.  Patient not taking: Reported on 10/20/2019   [name], MD  Active   latanoprost (XALATAN) 0.005 % ophthalmic solution [id number] Yes Place 1 drop into the left eye at bedtime. [provider] Taking Active Self           Med Note [name] Apr 17, 2019  6:42 PM)    lidocaine (LIDODERM) 5 % [id number] Yes Place 1 patch onto the skin daily as needed (for pain- remove old patch first).  [provider] Taking Active Self  Methoxy PEG-Epoetin Ernst Spell Conrad Tom Green) [id number]   Mircera [provider]  Active   midodrine (PROAMATINE) 10 MG tablet [id number] Yes Take 10 mg by mouth 3 (three) times daily. [provider] Taking Active Self  ondansetron (ZOFRAN-ODT) 4 MG disintegrating tablet [id number] Yes Take 1 tablet (4 mg total) by mouth every 8 (eight) hours as needed for nausea or vomiting. [name], FNP Taking Active Self  oxyCODONE-acetaminophen (PERCOCET) 10-325 MG tablet [id number] Yes Take 1 tablet by mouth 4 (four) times daily as needed.  [provider] Taking Active   tacrolimus (PROGRAF) 1 MG capsule [id number] Yes Take 2-3 mg by mouth See admin instructions. Take 3 mg by mouth in the morning and 2 mg at bedtime [name], MD Taking Active Self           Med Note [name] Apr 17, 2019  6:42 PM)    topiramate (TOPAMAX) 50 MG tablet [id number] No Take 1 tablet (50 mg total) by mouth 2 (two) times daily.  Patient not taking: Reported on 11/14/2019   [name], FNP Not Taking Active   Vitamin D, Ergocalciferol, (DRISDOL) 1.25 MG (50000 UNIT) CAPS capsule [id number] Yes Take 50,000 Units by mouth every Sunday.  [provider] Taking Active Self  zolpidem (AMBIEN) 10 MG tablet [id number] Yes Take 1 tablet (10 mg total) by mouth at bedtime as needed for sleep. [name], DO Taking Active Self          Assessment:   Goals Addressed              This Visit's Progress   .  "I want an aide to come to the home to help me a little bit with some light housework and meal preparation." (pt-stated)        [place] (see longitudinal plan of care for additional care plan information)  Current Barriers:  . Over the next 30 days, patient will work with CM team to address in-home personal care needs. Patient requests additional assistance with ADL/IADL. Home health PT consult pending.  Clinical Social Work Clinical Goal(s):  [name] Over the next 30 days, patient  will work with CM team to address needs related to in-home personal care needs.  Patient discharged from rehab in late August and is currently residing at home alone and receives no services. LCSW will collaborate with PCP to address her needs.  Interventions: . Inter-disciplinary care team collaboration (see longitudinal plan of care) . Patient interviewed and appropriate assessments performed . Provided education to patient/caregiver regarding level of care options. Patient may be eligible for [facility] services; will collaborate with home health PT regarding personal care needs.  Update:  RNCM spoke to patient regarding PCS and she has not received  any updates to date-will follow up with PCP.  Patient Self Care Activities:  . Patient will work with CM team to address personal care needs. . Licensed Clinical Social Worker will collaborate with MM care team and PCP office to address personal care needs.  Please see past updates related to this goal by clicking on the "Past Updates" button in the selected goal         Plan: RNCM will follow up with PCP regarding status of referral for services.

## 2019-11-14 NOTE — Patient Instructions (Signed)
Hi Ms Hoh,  Thank you for speaking with me today.   Ms. Cotten was given information about Medicaid Managed Care team care coordination services as a part of their Healthy [facility] Medicaid benefit. [name] verbally consented to engagement with the [facility] Managed Care team.   For questions related to your Healthy [facility] health plan, please call: [phone] or visit the homepage here: [email or web address]  If you would like to schedule transportation through your Healthy [facility] plan, please call the following number at least 2 days in advance of your appointment: [phone]  Goals Addressed              This Visit's Progress   .  "I want an aide to come to the home to help me a little bit with some light housework and meal preparation." (pt-stated)        [place] (see longitudinal plan of care for additional care plan information)  Current Barriers:  . Over the next 30 days, patient will work with CM team to address in-home personal care needs. Patient requests additional assistance with ADL/IADL. Home health PT consult pending.  Clinical Social Work Clinical Goal(s):  [name] Over the next 30 days, patient will work with CM team to address needs related to in-home personal care needs.  Patient discharged from rehab in late August and is currently residing at home alone and receives no services. LCSW will collaborate with PCP to address her needs.  Interventions: . Inter-disciplinary care team collaboration (see longitudinal plan of care) . Patient interviewed and appropriate assessments performed . Provided education to patient/caregiver regarding level of care options. Patient may be eligible for [facility] services; will collaborate with home health PT regarding personal care needs.  Update:  RNCM spoke to patient regarding PCS and she has not received any updates to date-will follow up with  PCP.  Patient Self Care Activities:  . Patient will work with CM team to address personal care needs. . Licensed Clinical Social Worker will collaborate with MM care team and PCP office to address personal care needs.  Please see past updates related to this goal by clicking on the "Past Updates" button in the selected goal         The Managed Medicaid care management team will reach out to the patient again over the next 30 days.   Aida Raider RN, BSN [place]  [facility] - Managed Medicaid High Risk [phone]

## 2019-11-15 NOTE — Telephone Encounter (Signed)
Pt sch to see Dr Haroldine Laws 11/10, this was his first available, pt is aware

## 2019-11-22 ENCOUNTER — Other Ambulatory Visit: Payer: Self-pay

## 2019-11-22 ENCOUNTER — Ambulatory Visit: Payer: Self-pay

## 2019-11-22 NOTE — Patient Instructions (Addendum)
Visit Information Tina Watkins, it was a pleasure speaking with you today. Please remember that the Managed Medicaid Care Team BSW will contact you regarding your PCS services and getting you scheduled with an outpatient therapy provider, [facility].   Tina Watkins was given information about Medicaid Managed Care team care coordination services as a part of their Healthy [facility] Medicaid benefit. Tina Watkins verbally consented to engagement with the [facility] Managed Care team.   For questions related to your Healthy [facility] health plan, please call: [phone] or visit the homepage here: [email or web address]  If you would like to schedule transportation through your Healthy [facility] plan, please call the following number at least 2 days in advance of your appointment: [phone]  Goals Addressed              This Visit's Progress   .  "I want an aide to come to the home to help me a little bit with some light housework and meal preparation." (pt-stated)   Not on track     [place] (see longitudinal plan of care for additional care plan information)  Current Barriers:  . Over the next 60 days, patient will work with CM team to address in-home personal care needs. Patient requests additional assistance with ADL/IADL. Home health PT consult pending.  Clinical Social Work Clinical Goal(s):  [name] Over the next 30 days, patient will work with CM team to address needs related to in-home personal care needs.  Patient discharged from rehab in late August and is currently residing at home alone and receives no services. LCSW will collaborate with PCP to address her needs.  Interventions: . Inter-disciplinary care team collaboration (see longitudinal plan of care) . Patient interviewed and appropriate assessments performed . Provided education to patient/caregiver regarding level of care options.  Patient may be eligible for [facility] services; will collaborate with home health PT regarding personal care needs.  Update:  RNCM spoke to patient regarding PCS and she has not received any updates to date-will follow up with PCP. [name] Update 11/22/2019: LCSW will collaborate with BSW and PCP regarding PCS services.  Patient Self Care Activities:  . Patient will work with CM team to address personal care needs. . Licensed Clinical Social Worker will collaborate with MM care team and PCP office to address personal care needs.  Please see past updates related to this goal by clicking on the "Past Updates" button in the selected goal      .  "I want to get some physical therapy when I am back at home. I want to be able to walk normal without any type of aid to assist me." (pt-stated)   Not on track     [place] (see longitudinal plan of care for additional care plan information)  Current Barriers:  . Care coordination needs related to physical mobility and therapy need . Patient discharged from [facility] after short-term stay for rehab. Her facility SW states she was discharged with home health - unable to determine agency. Patient has not started services with an agency. [name] Update 11/22/2019: Patient stated she has not heard anything from her PCP regarding physical therapy nor has she been contacted.   Clinical Social Work Clinical Goal(s):  [name] Over the next 14 days, patient will work with CM team to address needs related to home physical therapy.  Patient needs help her with her back pain. [name] Update 11/22/2019:  Over the next 60 days patient will work with the Managed Medicaid team to address her needs related to home physical therapy.   Interventions: . Inter-disciplinary care team collaboration (see longitudinal plan of care) . Patient interviewed and appropriate assessments performed . Referred to [place] colleague for follow-up regarding home care needs.   [name] Update  11/22/2019: LCSW will collaborate with PCP regarding patient's concern and explain that a referral may need to be resubmitted.  Patient Self Care Activities:  . Patient will work with CM team over the next 30 days. [name] Update 11/22/2019:  Patient stated she doesn't know the reason she has not been contacted about physical therapy.   Please see past updates related to this goal by clicking on the "Past Updates" button in the selected goal      .  outpatient therapy to help with depression symptoms        [place] (see longitudinal plan of care for additional care plan information)  Current Barriers:  . Community resources access barrier: Patient Lacks knowledge of community resource: mental health outpatient therapy providers. Patient has specific requests for a provider: older, female, have some basis with Christianity, and preferably in-person therapy sessions. Patient was initially reluctant because she did not understand how therapy could help. However, after discussion, she agreed to try therapy. . Patient needs assistance with appointment scheduling and follow up with community [facility], [phone]. . Limited social support . Medical/physical health concerns. 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  Clinical Social Work Clinical Goal(s):  [name] Over the next 60 days, patient will work with SW to address concerns related to depression and scheduling appointment with outpatient therapy provider. . Over the next 30-60 days, patient will work with BSW to address needs related to scheduling appointment with an outpatient mental health therapy provider.  Interventions: . Inter-disciplinary care team collaboration (see longitudinal plan of care) . Patient interviewed and appropriate assessments performed . Referred patient to community resources care guide team for assistance with scheduling  appointment for mental health therapy. . Provided mental health counseling with regard to depression, being alone and the importance of a support system. . Discussed plans with patient for ongoing care management follow up and provided patient with direct contact information for care management team . Collaborated with BSW re: outpatient therapy needs  Patient Self Care Activities:  . Patient will collaborate with the Managed Medicaid Care Team . Patient will attend all scheduled appointments. . Licensed Clinical Social Worker will refer patient to BSW for assistance scheduling appointment at [facility] at [phone]. . Licensed Clinical Social Worker will follow-up on December 25, 2019 @ 11:00am.  Initial goal documentation        Please see education materials related to living with depression provided below.    Living With Depression Everyone experiences occasional disappointment, sadness, and loss in their lives. When you are feeling down, blue, or sad for at least 2 weeks in a row, it may mean that you have depression. Depression can affect your thoughts and feelings, relationships, daily activities, and physical health. It is caused by changes in the way your brain functions. If you receive a diagnosis of depression, your health care provider will tell you which type of depression you have and what treatment options are available to you. If you are living with depression, there are ways to help you recover from it and also ways  to prevent it from coming back. How to cope with lifestyle changes Coping with stress     Stress is your body's reaction to life changes and events, both good and bad. Stressful situations may include:  Getting married.  The death of a spouse.  Losing a job.  Retiring.  Having a baby. Stress can last just a few hours or it can be ongoing. Stress can play a major role in depression, so it is important to learn both how to cope with  stress and how to think about it differently. Talk with your health care provider or a counselor if you would like to learn more about stress reduction. He or she may suggest some stress reduction techniques, such as:  Music therapy. This can include creating music or listening to music. Choose music that you enjoy and that inspires you.  Mindfulness-based meditation. This kind of meditation can be done while sitting or walking. It involves being aware of your normal breaths, rather than trying to control your breathing.  Centering prayer. This is a kind of meditation that involves focusing on a spiritual word or phrase. Choose a word, phrase, or sacred image that is meaningful to you and that brings you peace.  Deep breathing. To do this, expand your stomach and inhale slowly through your nose. Hold your breath for 3-5 seconds, then exhale slowly, allowing your stomach muscles to relax.  Muscle relaxation. This involves intentionally tensing muscles then relaxing them. Choose a stress reduction technique that fits your lifestyle and personality. Stress reduction techniques take time and practice to develop. Set aside 5-15 minutes a day to do them. Therapists can offer training in these techniques. The training may be covered by some insurance plans. Other things you can do to manage stress include:  Keeping a stress diary. This can help you learn what triggers your stress and ways to control your response.  Understanding what your limits are and saying no to requests or events that lead to a schedule that is too full.  Thinking about how you respond to certain situations. You may not be able to control everything, but you can control how you react.  Adding humor to your life by watching funny films or TV shows.  Making time for activities that help you relax and not feeling guilty about spending your time this way.  Medicines Your health care provider may suggest certain medicines if he or  she feels that they will help improve your condition. Avoid using alcohol and other substances that may prevent your medicines from working properly (may interact). It is also important to:  Talk with your pharmacist or health care provider about all the medicines that you take, their possible side effects, and what medicines are safe to take together.  Make it your goal to take part in all treatment decisions (shared decision-making). This includes giving input on the side effects of medicines. It is best if shared decision-making with your health care provider is part of your total treatment plan. If your health care provider prescribes a medicine, you may not notice the full benefits of it for 4-8 weeks. Most people who are treated for depression need to be on medicine for at least 6-12 months after they feel better. If you are taking medicines as part of your treatment, do not stop taking medicines without first talking to your health care provider. You may need to have the medicine slowly decreased (tapered) over time to decrease the risk of harmful  side effects. Relationships Your health care provider may suggest family therapy along with individual therapy and drug therapy. While there may not be family problems that are causing you to feel depressed, it is still important to make sure your family learns as much as they can about your mental health. Having your family's support can help make your treatment successful. How to recognize changes in your condition Everyone has a different response to treatment for depression. Recovery from major depression happens when you have not had signs of major depression for two months. This may mean that you will start to:  Have more interest in doing activities.  Feel less hopeless than you did 2 months ago.  Have more energy.  Overeat less often, or have better or improving appetite.  Have better concentration. Your health care provider will work  with you to decide the next steps in your recovery. It is also important to recognize when your condition is getting worse. Watch for these signs:  Having fatigue or low energy.  Eating too much or too little.  Sleeping too much or too little.  Feeling restless, agitated, or hopeless.  Having trouble concentrating or making decisions.  Having unexplained physical complaints.  Feeling irritable, angry, or aggressive. Get help as soon as you or your family members notice these symptoms coming back. How to get support and help from others How to talk with friends and family members about your condition  Talking to friends and family members about your condition can provide you with one way to get support and guidance. Reach out to trusted friends or family members, explain your symptoms to them, and let them know that you are working with a health care provider to treat your depression. Financial resources Not all insurance plans cover mental health care, so it is important to check with your insurance carrier. If paying for co-pays or counseling services is a problem, search for a local or county mental health care center. They may be able to offer public mental health care services at low or no cost when you are not able to see a private health care provider. If you are taking medicine for depression, you may be able to get the generic form, which may be less expensive. Some makers of prescription medicines also offer help to patients who cannot afford the medicines they need. Follow these instructions at home:   Get the right amount and quality of sleep.  Cut down on using caffeine, tobacco, alcohol, and other potentially harmful substances.  Try to exercise, such as walking or lifting small weights.  Take over-the-counter and prescription medicines only as told by your health care provider.  Eat a healthy diet that includes plenty of vegetables, fruits, whole grains, low-fat dairy  products, and lean protein. Do not eat a lot of foods that are high in solid fats, added sugars, or salt.  Keep all follow-up visits as told by your health care provider. This is important. Contact a health care provider if:  You stop taking your antidepressant medicines, and you have any of these symptoms: ? Nausea. ? Headache. ? Feeling lightheaded. ? Chills and body aches. ? Not being able to sleep (insomnia).  You or your friends and family think your depression is getting worse. Get help right away if:  You have thoughts of hurting yourself or others. If you ever feel like you may hurt yourself or others, or have thoughts about taking your own life, get help right away. You  can go to your nearest emergency department or call:  Your local emergency services (911 in the [place]  A suicide crisis helpline, such as the [facility] at 785-573-7419. This is open 24-hours a day. Summary  If you are living with depression, there are ways to help you recover from it and also ways to prevent it from coming back.  Work with your health care team to create a management plan that includes counseling, stress management techniques, and healthy lifestyle habits. This information is not intended to replace advice given to you by your health care provider. Make sure you discuss any questions you have with your health care provider. Document Revised: 05/13/2018 Document Reviewed: 12/23/2015 Elsevier Patient Education  [place].   Patient verbalizes understanding of instructions provided today.   Licensed Clinical Social Worker will follow up on December 25, 2019 @ 11:0am. BSW will contact you to schedule your appointment for outpatient therapy and to discuss PCS services. The patient has been provided with contact information for the Managed Medicaid care management team and has been advised to call with any health related questions or concerns.   Netta Neat, BSW, MSW, LCSW Social Work Case Freight forwarder - [place]  [facility] [place]: [phone]

## 2019-11-22 NOTE — Patient Outreach (Addendum)
Care Coordination- Social Work  11/22/2019  Tina Watkins [birth year] [id number]  Subjective:    Tina Watkins is an 54 y.o. year old female who is a primary patient of Tina Glatter, FNP.    Tina Watkins was given information about Medicaid Managed Care team care coordination services today. Tina Watkins agreed to services and verbal consent obtained  Review of patient status, laboratory and other test data was performed as part of evaluation for provision of services.  SDOH:   SDOH Screenings   Alcohol Screen: Low Risk   . Last Alcohol Screening Score (AUDIT): 1  Depression (PHQ2-9): Medium Risk  . PHQ-2 Score: 15  Financial Resource Strain: High Risk  . Difficulty of Paying Living Expenses: Very hard  Food Insecurity: No Food Insecurity  . Worried About Charity fundraiser in the Last Year: Never true  . Watkins Out of Food in the Last Year: Never true  Housing: Low Risk   . Last Housing Risk Score: 0  Physical Activity: Insufficiently Active  . Days of Exercise per Week: 3 days  . Minutes of Exercise per Session: 10 min  Social Connections: Moderately Isolated  . Frequency of Communication with Friends and Family: More than three times a week  . Frequency of Social Gatherings with Friends and Family: More than three times a week  . Attends Religious Services: More than 4 times per year  . Active Member of Clubs or Organizations: No  . Attends Archivist Meetings: Never  . Marital Status: Never married  Stress: Stress Concern Present  . Feeling of Stress : To some extent  Tobacco Use: High Risk  . Smoking Tobacco Use: Current Every Day Smoker  . Smokeless Tobacco Use: Never Used  Transportation Needs: No Transportation Needs  . Lack of Transportation (Medical): No  . Lack of Transportation (Non-Medical): No   SDOH Interventions     Most Recent Value  SDOH Interventions  Food Insecurity Interventions Intervention Not  Indicated  Financial Strain Interventions Intervention Not Indicated  Depression Interventions/Treatment  Counseling  [Patient agreed to try therapy to see if she will receive any results.]      Objective:    Medications:  Medications Reviewed Today    Reviewed by Gayla Medicus, RN (Registered Nurse) on 11/14/19 at 7  Med List Status: <None>  Medication Order Taking? Sig Documenting Provider Last Dose Status Informant  allopurinol (ZYLOPRIM) 100 MG tablet [id number]  Take 1 tablet (100 mg total) by mouth daily. [name], Pratik D, DO  Expired 10/20/19 2359 Self  aspirin EC 81 MG tablet [id number]  Take 81 mg by mouth daily.  [provider]  Active Self  colchicine 0.6 MG tablet [id number]  Take 0.5 tablets (0.3 mg total) by mouth daily. [name], MD  Active Self           Med Note Tina Watkins, ELIZABETH A   Fri Sep 15, 2019  8:21 AM) Just takes when has gout flare  diphenhydrAMINE (BENADRYL) 25 mg capsule [id number] Yes Take 1 capsule (25 mg total) by mouth every 8 (eight) hours as needed.  Patient taking differently: Take 25 mg by mouth every 8 (eight) hours as needed for itching.    Tina Glatter, FNP Taking Active Self  famotidine (PEPCID) 20 MG tablet [id number] Yes Take 1 tablet (20 mg total) by mouth 2 (two) times daily. Tina Glatter, FNP Taking Active Self  gabapentin (NEURONTIN) 100 MG capsule [id number] Yes Take 100  mg by mouth 2 (two) times daily.  [provider] Taking Active Self  HYDROmorphone (DILAUDID) 2 MG tablet [id number]  Take 0.5 tablets (1 mg total) by mouth every 6 (six) hours as needed for severe pain.  Patient not taking: Reported on 10/20/2019   Tina Hazel, MD  Active   latanoprost (XALATAN) 0.005 % ophthalmic solution [id number] Yes Place 1 drop into the left eye at bedtime. [provider] Taking Active Self           Med Note [name] Apr 17, 2019  6:42 PM)    lidocaine (LIDODERM) 5 % [id number] Yes Place 1  patch onto the skin daily as needed (for pain- remove old patch first).  [provider] Taking Active Self  Methoxy PEG-Epoetin Ernst Spell Conrad Tolna) [id number]  Mircera [provider]  Active   midodrine (PROAMATINE) 10 MG tablet [id number] Yes Take 10 mg by mouth 3 (three) times daily. [provider] Taking Active Self  ondansetron (ZOFRAN-ODT) 4 MG disintegrating tablet [id number] Yes Take 1 tablet (4 mg total) by mouth every 8 (eight) hours as needed for nausea or vomiting. Tina Glatter, FNP Taking Active Self  oxyCODONE-acetaminophen (PERCOCET) 10-325 MG tablet [id number] Yes Take 1 tablet by mouth 4 (four) times daily as needed.  [provider] Taking Active   tacrolimus (PROGRAF) 1 MG capsule [id number] Yes Take 2-3 mg by mouth See admin instructions. Take 3 mg by mouth in the morning and 2 mg at bedtime Tina Ran, MD Taking Active Self           Med Note [name] Apr 17, 2019  6:42 PM)    topiramate (TOPAMAX) 50 MG tablet [id number] No Take 1 tablet (50 mg total) by mouth 2 (two) times daily.  Patient not taking: Reported on 11/14/2019   Tina Glatter, FNP Not Taking Active   Vitamin D, Ergocalciferol, (DRISDOL) 1.25 MG (50000 UNIT) CAPS capsule [id number] Yes Take 50,000 Units by mouth every Sunday.  [provider] Taking Active Self  zolpidem (AMBIEN) 10 MG tablet [id number] Yes Take 1 tablet (10 mg total) by mouth at bedtime as needed for sleep. [name], DO Taking Active Self          Fall/Depression Screening:  Fall Risk  10/20/2019 06/13/2019 05/08/2019  Falls in the past year? 0 0 0  Number falls in past yr: 0 0 0  Injury with Fall? 0 0 0   PHQ 2/9 Scores 11/22/2019 10/23/2019 10/20/2019 06/26/2014  PHQ - 2 Score 3 4 3 6   PHQ- 9 Score 15 12 14  -    Assessment:  Goals Addressed              This Visit's Progress   .  "I want an aide to come to the home to help me a little bit with some light  housework and meal preparation." (pt-stated)   Not on track     [facility] (see longitudinal plan of care for additional care plan information)  Current Barriers:  . Over the next 60 days, patient will work with CM team to address in-home personal care needs. Patient requests additional assistance with ADL/IADL. Home health PT consult pending.  Clinical Social Work Clinical Goal(s):  [name] Over the next 30 days, patient will work with CM team to address needs related to in-home personal care needs.  Patient discharged from rehab in  late August and is currently residing at home alone and receives no services. LCSW will collaborate with PCP to address her needs.  Interventions: . Inter-disciplinary care team collaboration (see longitudinal plan of care) . Patient interviewed and appropriate assessments performed . Provided education to patient/caregiver regarding level of care options. Patient may be eligible for [facility] services; will collaborate with home health PT regarding personal care needs.  Update:  RNCM spoke to patient regarding PCS and she has not received any updates to date-will follow up with PCP. [name] Update 11/22/2019: LCSW will collaborate with BSW and PCP regarding PCS services.  Patient Self Care Activities:  . Patient will work with CM team to address personal care needs. . Licensed Clinical Social Worker will collaborate with MM care team and PCP office to address personal care needs.  Please see past updates related to this goal by clicking on the "Past Updates" button in the selected goal      .  "I want to get some physical therapy when I am back at home. I want to be able to walk normal without any type of aid to assist me." (pt-stated)   Not on track     [place] (see longitudinal plan of care for additional care plan information)  Current Barriers:  . Care coordination needs related to physical mobility and therapy  need . Patient discharged from [facility] after short-term stay for rehab. Her facility SW states she was discharged with home health - unable to determine agency. Patient has not started services with an agency. [name] Update 11/22/2019: Patient stated she has not heard anything from her PCP regarding physical therapy nor has she been contacted.   Clinical Social Work Clinical Goal(s):  [name] Over the next 14 days, patient will work with CM team to address needs related to home physical therapy.  Patient needs help her with her back pain. [name] Update 11/22/2019: Over the next 60 days patient will work with the Managed Medicaid team to address her needs related to home physical therapy.   Interventions: . Inter-disciplinary care team collaboration (see longitudinal plan of care) . Patient interviewed and appropriate assessments performed . Referred to [place] colleague for follow-up regarding home care needs.   [name] Update 11/22/2019: LCSW will collaborate with PCP regarding patient's concern and explain that a referral may need to be resubmitted.  Patient Self Care Activities:  . Patient will work with CM team over the next 30 days. [name] Update 11/22/2019:  Patient stated she doesn't know the reason she has not been contacted about physical therapy.   Please see past updates related to this goal by clicking on the "Past Updates" button in the selected goal      .  outpatient therapy to help with depression symptoms        [place] (see longitudinal plan of care for additional care plan information)  Current Barriers:  . Community resources access barrier: Patient Lacks knowledge of community resource: mental health outpatient therapy providers. Patient has specific requests for a provider: older, female, have some basis with Christianity, and preferably in-person therapy sessions. Patient was initially reluctant because she did not understand how therapy could help. However,  after discussion, she agreed to try therapy. . Patient needs assistance with appointment scheduling and follow up with community [facility], [phone]. . Limited social support . Medical/physical health concerns. Patient has a number of physical health disorders for  which she receives treatment.  Clinical Social Work Clinical Goal(s):  [name] Over the next 60 days, patient will work with SW to address concerns related to depression and scheduling appointment with outpatient therapy provider. . Over the next 30-60 days, patient will work with BSW to address needs related to scheduling appointment with an outpatient mental health therapy provider.  Interventions: . Inter-disciplinary care team collaboration (see longitudinal plan of care) . Patient interviewed and appropriate assessments performed . Referred patient to community resources care guide team for assistance with scheduling appointment for mental health therapy. . Provided mental health counseling with regard to depression, being alone and the importance of a support system. . Discussed plans with patient for ongoing care management follow up and provided patient with direct contact information for care management team . Collaborated with BSW re: outpatient therapy needs  Patient Self Care Activities:  . Patient will collaborate with the Managed Medicaid Care Team . Patient will attend all scheduled appointments. . Licensed Clinical Social Worker will refer patient to BSW for assistance scheduling appointment at [employer] at [phone]. . Licensed Clinical Social Worker will follow-up on December 25, 2019 @ 11:00am.  Initial goal documentation        Plan: LCSW will follow up with patient on December 25, 2019 @ 11:00am.  Netta Neat, BSW, MSW, [place]: [phone]

## 2019-11-23 ENCOUNTER — Ambulatory Visit: Payer: Medicaid Other | Admitting: Cardiovascular Disease

## 2019-11-29 ENCOUNTER — Other Ambulatory Visit: Payer: Self-pay

## 2019-11-29 NOTE — Patient Outreach (Signed)
Care Coordination- Social Work  11/29/2019  Tina Watkins Aug 10, 1965 [id number]  Subjective:    Tina Watkins is an 54 y.o. year old female who is a primary patient of Tina Glatter, FNP.    Ms. Desanctis was given information about Medicaid Managed Care team care coordination services today. Tina Watkins agreed to services and verbal consent obtained  Review of patient status, laboratory and other test data was performed as part of evaluation for provision of services.  SDOH:   SDOH Screenings   Alcohol Screen: Low Risk   . Last Alcohol Screening Score (AUDIT): 1  Depression (PHQ2-9): Medium Risk  . PHQ-2 Score: 15  Financial Resource Strain: High Risk  . Difficulty of Paying Living Expenses: Very hard  Food Insecurity: No Food Insecurity  . Worried About Charity fundraiser in the Last Year: Never true  . Watkins Out of Food in the Last Year: Never true  Housing: Low Risk   . Last Housing Risk Score: 0  Physical Activity: Insufficiently Active  . Days of Exercise per Week: 3 days  . Minutes of Exercise per Session: 10 min  Social Connections: Moderately Isolated  . Frequency of Communication with Friends and Family: More than three times a week  . Frequency of Social Gatherings with Friends and Family: More than three times a week  . Attends Religious Services: More than 4 times per year  . Active Member of Clubs or Organizations: No  . Attends Archivist Meetings: Never  . Marital Status: Never married  Stress: Stress Concern Present  . Feeling of Stress : To some extent  Tobacco Use: High Risk  . Smoking Tobacco Use: Current Every Day Smoker  . Smokeless Tobacco Use: Never Used  Transportation Needs: No Transportation Needs  . Lack of Transportation (Medical): No  . Lack of Transportation (Non-Medical): No     Objective:    Medications:  Medications Reviewed Today    Reviewed by Gayla Medicus, RN (Registered Nurse)  on 11/14/19 at 44  Med List Status: <None>  Medication Order Taking? Sig Documenting Provider Last Dose Status Informant  allopurinol (ZYLOPRIM) 100 MG tablet [id number]  Take 1 tablet (100 mg total) by mouth daily. Tina Watkins, Pratik D, DO  Expired 10/20/19 2359 Self  aspirin EC 81 MG tablet [id number]  Take 81 mg by mouth daily.  [provider]  Active Self  colchicine 0.6 MG tablet [id number]  Take 0.5 tablets (0.3 mg total) by mouth daily. Tina Coss, MD  Active Self           Med Note [name], ELIZABETH A   Fri Sep 15, 2019  8:21 AM) Just takes when has gout flare  diphenhydrAMINE (BENADRYL) 25 mg capsule [id number] Yes Take 1 capsule (25 mg total) by mouth every 8 (eight) hours as needed.  Patient taking differently: Take 25 mg by mouth every 8 (eight) hours as needed for itching.    Tina Glatter, FNP Taking Active Self  famotidine (PEPCID) 20 MG tablet [id number] Yes Take 1 tablet (20 mg total) by mouth 2 (two) times daily. Tina Glatter, FNP Taking Active Self  gabapentin (NEURONTIN) 100 MG capsule [id number] Yes Take 100 mg by mouth 2 (two) times daily.  [provider] Taking Active Self  HYDROmorphone (DILAUDID) 2 MG tablet [id number]  Take 0.5 tablets (1 mg total) by mouth every 6 (six) hours as needed for severe pain.  Patient not taking: Reported on 10/20/2019  Tina Hazel, MD  Active   latanoprost (XALATAN) 0.005 % ophthalmic solution [id number] Yes Place 1 drop into the left eye at bedtime. [provider] Taking Active Self           Med Note [name] Apr 17, 2019  6:42 PM)    lidocaine (LIDODERM) 5 % [id number] Yes Place 1 patch onto the skin daily as needed (for pain- remove old patch first).  [provider] Taking Active Self  Methoxy PEG-Epoetin Tina Watkins) [id number]  Mircera [provider]  Active   midodrine (PROAMATINE) 10 MG tablet [id number] Yes Take 10 mg by mouth 3 (three) times daily. [provider] Taking Active Self  ondansetron (ZOFRAN-ODT) 4 MG disintegrating tablet [id number] Yes Take 1 tablet (4 mg total) by mouth every 8 (eight) hours as needed for nausea or vomiting. Tina Glatter, FNP Taking Active Self  oxyCODONE-acetaminophen (PERCOCET) 10-325 MG tablet [id number] Yes Take 1 tablet by mouth 4 (four) times daily as needed.  [provider] Taking Active   tacrolimus (PROGRAF) 1 MG capsule [id number] Yes Take 2-3 mg by mouth See admin instructions. Take 3 mg by mouth in the morning and 2 mg at bedtime Tina Ran, MD Taking Active Self           Med Note [name] Apr 17, 2019  6:42 PM)    topiramate (TOPAMAX) 50 MG tablet [id number] No Take 1 tablet (50 mg total) by mouth 2 (two) times daily.  Patient not taking: Reported on 11/14/2019   Tina Glatter, FNP Not Taking Active   Vitamin Watkins, Ergocalciferol, (DRISDOL) 1.25 MG (50000 UNIT) CAPS capsule [id number] Yes Take 50,000 Units by mouth every Sunday.  [provider] Taking Active Self  zolpidem (AMBIEN) 10 MG tablet [id number] Yes Take 1 tablet (10 mg total) by mouth at bedtime as needed for sleep. Tina Lark D, DO Taking Active Self          Fall/Depression Screening:  Fall Risk  10/20/2019 06/13/2019 05/08/2019  Falls in the past year? 0 0 0  Number falls in past yr: 0 0 0  Injury with Fall? 0 0 0   PHQ 2/9 Scores 11/22/2019 10/23/2019 10/20/2019 06/26/2014  PHQ - 2 Score 3 4 3 6   PHQ- 9 Score 15 12 14  -    Assessment:  Goals Addressed              This Visit's Progress   .  "I want an aide to come to the home to help me a little bit with some light housework and meal preparation." (pt-stated)        [place] (see longitudinal plan of care for additional care plan information)  Current Barriers:  . Over the next 60 days, patient will work with CM team to address in-home personal care needs. Patient requests additional assistance  with ADL/IADL. Home health PT consult pending.  Clinical Social Work Clinical Goal(s):  [name] Over the next 30 days, patient will work with CM team to address needs related to in-home personal care needs.  Patient discharged from rehab in late August and is currently residing at home alone and receives no services. LCSW will collaborate with PCP to address her needs.  Interventions: . Inter-disciplinary care team collaboration (see longitudinal plan of care) . Patient interviewed and appropriate assessments performed . Provided education to patient/caregiver regarding level of care  options. Patient may be eligible for [facility] services; will collaborate with home health PT regarding personal care needs.  Update:  RNCM spoke to patient regarding PCS and she has not received any updates to date-will follow up with PCP. [name] Update 11/22/2019: LCSW will collaborate with BSW and PCP regarding PCS services. . BSW contacted [employer] at [phone] for [place] stated that PCP has to complete referral form.  Patient Self Care Activities:  . Patient will work with CM team to address personal care needs. . Licensed Clinical Social Worker will collaborate with MM care team and PCP office to address personal care needs.  Please see past updates related to this goal by clicking on the "Past Updates" button in the selected goal      .  outpatient therapy to help with depression symptoms        [place] (see longitudinal plan of care for additional care plan information)  Current Barriers:  . Community resources access barrier: Patient Lacks knowledge of community resource: mental health outpatient therapy providers. Patient has specific requests for a provider: older, female, have some basis with Christianity, and preferably in-person therapy sessions. Patient was initially reluctant because she did not understand how therapy could help. However, after discussion,  she agreed to try therapy. . Patient needs assistance with appointment scheduling and follow up with community [facility], [phone]. . Limited social support . Medical/physical health concerns. 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  Clinical Social Work Clinical Goal(s):  [name] Over the next 60 days, patient will work with SW to address concerns related to depression and scheduling appointment with outpatient therapy provider. . Over the next 30-60 days, patient will work with BSW to address needs related to scheduling appointment with an outpatient mental health therapy provider.  Interventions: . Inter-disciplinary care team collaboration (see longitudinal plan of care) . Patient interviewed and appropriate assessments performed . Referred patient to community resources care guide team for assistance with scheduling appointment for mental health therapy. . Provided mental health counseling with regard to depression, being alone and the importance of a support system. . Discussed plans with patient for ongoing care management follow up and provided patient with direct contact information for care management team . Collaborated with BSW re: outpatient therapy needs . BSW contacted [facility] for outpatient 12/11/19 at 10:20 am for an assessment.   Patient Self Care Activities:  . Patient will collaborate with the Managed Medicaid Care Team . Patient will attend all scheduled appointments. . Licensed Clinical Social Worker will refer patient to BSW for assistance scheduling appointment at [employer] at [phone]. . Licensed Clinical Social Worker will follow-up on December 25, 2019 @ 11:00am.  Initial goal documentation        Plan: Patient will attend therapy appointment on 12/11/19.  BSW will follow up in 14 days.

## 2019-11-29 NOTE — Patient Instructions (Signed)
Visit Information  Ms. Roorda was given information about Medicaid Managed Care team care coordination services as a part of their Healthy [facility] Medicaid benefit. [name] verbally consented to engagement with the [facility] Managed Care team.   For questions related to your Healthy [facility] health plan, please call: [phone] or visit the homepage here: [email or web address]  If you would like to schedule transportation through your Healthy [facility] plan, please call the following number at least 2 days in advance of your appointment: [phone]  Goals Addressed              This Visit's Progress   .  "I want an aide to come to the home to help me a little bit with some light housework and meal preparation." (pt-stated)        [place] (see longitudinal plan of care for additional care plan information)  Current Barriers:  . Over the next 60 days, patient will work with CM team to address in-home personal care needs. Patient requests additional assistance with ADL/IADL. Home health PT consult pending.  Clinical Social Work Clinical Goal(s):  [name] Over the next 30 days, patient will work with CM team to address needs related to in-home personal care needs.  Patient discharged from rehab in late August and is currently residing at home alone and receives no services. LCSW will collaborate with PCP to address her needs.  Interventions: . Inter-disciplinary care team collaboration (see longitudinal plan of care) . Patient interviewed and appropriate assessments performed . Provided education to patient/caregiver regarding level of care options. Patient may be eligible for [facility] services; will collaborate with home health PT regarding personal care needs.  Update:  RNCM spoke to patient regarding PCS and she has not received any updates to date-will follow up with PCP. [name] Update 11/22/2019: LCSW will  collaborate with BSW and PCP regarding PCS services. . BSW contacted [employer] at [phone] for [place] stated that PCP has to complete referral form.  Patient Self Care Activities:  . Patient will work with CM team to address personal care needs. . Licensed Clinical Social Worker will collaborate with MM care team and PCP office to address personal care needs.  Please see past updates related to this goal by clicking on the "Past Updates" button in the selected goal      .  outpatient therapy to help with depression symptoms        [place] (see longitudinal plan of care for additional care plan information)  Current Barriers:  . Community resources access barrier: Patient Lacks knowledge of community resource: mental health outpatient therapy providers. Patient has specific requests for a provider: older, female, have some basis with Christianity, and preferably in-person therapy sessions. Patient was initially reluctant because she did not understand how therapy could help. However, after discussion, she agreed to try therapy. . Patient needs assistance with appointment scheduling and follow up with community [facility], [phone]. . Limited social support . Medical/physical health concerns. 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  Clinical Social Work Clinical Goal(s):  [name] Over the next 60 days, patient will work with SW to address concerns related to depression and scheduling appointment with outpatient therapy provider. . Over the next 30-60 days, patient will work with BSW to address needs related to scheduling appointment with an outpatient mental health therapy provider.  Interventions: . Inter-disciplinary  care team collaboration (see longitudinal plan of care) . Patient interviewed and appropriate assessments performed . Referred patient to community  resources care guide team for assistance with scheduling appointment for mental health therapy. . Provided mental health counseling with regard to depression, being alone and the importance of a support system. . Discussed plans with patient for ongoing care management follow up and provided patient with direct contact information for care management team . Collaborated with BSW re: outpatient therapy needs . BSW contacted [facility] for outpatient 12/11/19 at 10:20 am for an assessment.   Patient Self Care Activities:  . Patient will collaborate with the Managed Medicaid Care Team . Patient will attend all scheduled appointments. . Licensed Clinical Social Worker will refer patient to BSW for assistance scheduling appointment at [employer] at [phone]. . Licensed Clinical Social Worker will follow-up on December 25, 2019 @ 11:00am.  Initial goal documentation          Social Worker will follow up with patient in 14 days.Mickel Fuchs, BSW, [place]  High Risk Managed Medicaid Team

## 2019-11-30 ENCOUNTER — Telehealth (HOSPITAL_COMMUNITY): Payer: Self-pay | Admitting: *Deleted

## 2019-11-30 NOTE — Telephone Encounter (Signed)
Received surg clearance request from [facility] general surgery clinic. Pt needs clearance for hernia repair  Per Dr Haroldine Laws: "ok to proceed. Moderate risk for cv complications"  Form faxed back to them at [phone]

## 2019-12-01 DIAGNOSIS — R601 Generalized edema: Secondary | ICD-10-CM | POA: Diagnosis not present

## 2019-12-01 DIAGNOSIS — I700 Atherosclerosis of aorta: Secondary | ICD-10-CM | POA: Diagnosis not present

## 2019-12-01 DIAGNOSIS — Z905 Acquired absence of kidney: Secondary | ICD-10-CM | POA: Diagnosis not present

## 2019-12-01 DIAGNOSIS — J90 Pleural effusion, not elsewhere classified: Secondary | ICD-10-CM | POA: Diagnosis not present

## 2019-12-01 DIAGNOSIS — R109 Unspecified abdominal pain: Secondary | ICD-10-CM | POA: Diagnosis not present

## 2019-12-01 DIAGNOSIS — M87851 Other osteonecrosis, right femur: Secondary | ICD-10-CM | POA: Diagnosis not present

## 2019-12-01 DIAGNOSIS — N3289 Other specified disorders of bladder: Secondary | ICD-10-CM | POA: Diagnosis not present

## 2019-12-01 DIAGNOSIS — Z940 Kidney transplant status: Secondary | ICD-10-CM | POA: Diagnosis not present

## 2019-12-01 DIAGNOSIS — N854 Malposition of uterus: Secondary | ICD-10-CM | POA: Diagnosis not present

## 2019-12-03 DIAGNOSIS — N186 End stage renal disease: Secondary | ICD-10-CM | POA: Diagnosis not present

## 2019-12-03 DIAGNOSIS — T8612 Kidney transplant failure: Secondary | ICD-10-CM | POA: Diagnosis not present

## 2019-12-03 DIAGNOSIS — Z992 Dependence on renal dialysis: Secondary | ICD-10-CM | POA: Diagnosis not present

## 2019-12-04 DIAGNOSIS — M549 Dorsalgia, unspecified: Secondary | ICD-10-CM | POA: Diagnosis not present

## 2019-12-04 DIAGNOSIS — Z992 Dependence on renal dialysis: Secondary | ICD-10-CM | POA: Diagnosis not present

## 2019-12-04 DIAGNOSIS — G8929 Other chronic pain: Secondary | ICD-10-CM | POA: Diagnosis not present

## 2019-12-04 DIAGNOSIS — T8612 Kidney transplant failure: Secondary | ICD-10-CM | POA: Diagnosis not present

## 2019-12-04 DIAGNOSIS — M542 Cervicalgia: Secondary | ICD-10-CM | POA: Diagnosis not present

## 2019-12-04 DIAGNOSIS — N186 End stage renal disease: Secondary | ICD-10-CM | POA: Diagnosis not present

## 2019-12-04 DIAGNOSIS — Z79899 Other long term (current) drug therapy: Secondary | ICD-10-CM | POA: Diagnosis not present

## 2019-12-04 DIAGNOSIS — E782 Mixed hyperlipidemia: Secondary | ICD-10-CM | POA: Diagnosis not present

## 2019-12-08 ENCOUNTER — Ambulatory Visit
Admission: RE | Admit: 2019-12-08 | Discharge: 2019-12-08 | Disposition: A | Discharge status: Home / Self Care | Payer: Medicaid Other | Source: Ambulatory Visit | Attending: Family Medicine | Admitting: Family Medicine

## 2019-12-08 ENCOUNTER — Other Ambulatory Visit: Payer: Self-pay

## 2019-12-08 ENCOUNTER — Ambulatory Visit: Payer: Medicaid Other

## 2019-12-08 DIAGNOSIS — Z1231 Encounter for screening mammogram for malignant neoplasm of breast: Secondary | ICD-10-CM | POA: Diagnosis not present

## 2019-12-12 ENCOUNTER — Encounter (HOSPITAL_COMMUNITY): Payer: Self-pay | Admitting: Emergency Medicine

## 2019-12-12 ENCOUNTER — Emergency Department (HOSPITAL_COMMUNITY): Payer: Medicare Other

## 2019-12-12 ENCOUNTER — Other Ambulatory Visit: Payer: Self-pay

## 2019-12-12 ENCOUNTER — Emergency Department (HOSPITAL_COMMUNITY)
Admission: EM | Admit: 2019-12-12 | Discharge: 2019-12-12 | Disposition: A | Discharge status: Home / Self Care | Payer: Medicare Other | Attending: Emergency Medicine | Admitting: Emergency Medicine

## 2019-12-12 DIAGNOSIS — Z992 Dependence on renal dialysis: Secondary | ICD-10-CM | POA: Diagnosis not present

## 2019-12-12 DIAGNOSIS — Z5321 Procedure and treatment not carried out due to patient leaving prior to being seen by health care provider: Secondary | ICD-10-CM | POA: Diagnosis not present

## 2019-12-12 DIAGNOSIS — R6883 Chills (without fever): Secondary | ICD-10-CM | POA: Insufficient documentation

## 2019-12-12 DIAGNOSIS — R0602 Shortness of breath: Secondary | ICD-10-CM | POA: Insufficient documentation

## 2019-12-12 DIAGNOSIS — R531 Weakness: Secondary | ICD-10-CM | POA: Diagnosis not present

## 2019-12-12 DIAGNOSIS — J811 Chronic pulmonary edema: Secondary | ICD-10-CM | POA: Diagnosis not present

## 2019-12-12 DIAGNOSIS — J90 Pleural effusion, not elsewhere classified: Secondary | ICD-10-CM | POA: Diagnosis not present

## 2019-12-12 LAB — CBC WITH DIFFERENTIAL/PLATELET
Abs Immature Granulocytes: 0.05 10*3/uL (ref 0.00–0.07)
Basophils Absolute: 0 10*3/uL (ref 0.0–0.1)
Basophils Relative: 0 %
Eosinophils Absolute: 0.1 10*3/uL (ref 0.0–0.5)
Eosinophils Relative: 1 %
HCT: 37.2 % (ref 36.0–46.0)
Hemoglobin: 11.3 g/dL — ABNORMAL LOW (ref 12.0–15.0)
Immature Granulocytes: 1 %
Lymphocytes Relative: 17 %
Lymphs Abs: 1.5 10*3/uL (ref 0.7–4.0)
MCH: 26.3 pg (ref 26.0–34.0)
MCHC: 30.4 g/dL (ref 30.0–36.0)
MCV: 86.7 fL (ref 80.0–100.0)
Monocytes Absolute: 0.5 10*3/uL (ref 0.1–1.0)
Monocytes Relative: 6 %
Neutro Abs: 6.7 10*3/uL (ref 1.7–7.7)
Neutrophils Relative %: 75 %
Platelets: 278 10*3/uL (ref 150–400)
RBC: 4.29 MIL/uL (ref 3.87–5.11)
RDW: 21.4 % — ABNORMAL HIGH (ref 11.5–15.5)
WBC: 8.8 10*3/uL (ref 4.0–10.5)
nRBC: 0 % (ref 0.0–0.2)

## 2019-12-12 LAB — BASIC METABOLIC PANEL
Anion gap: 17 — ABNORMAL HIGH (ref 5–15)
BUN: 33 mg/dL — ABNORMAL HIGH (ref 6–20)
CO2: 21 mmol/L — ABNORMAL LOW (ref 22–32)
Calcium: 10.2 mg/dL (ref 8.9–10.3)
Chloride: 103 mmol/L (ref 98–111)
Creatinine, Ser: 3.9 mg/dL — ABNORMAL HIGH (ref 0.44–1.00)
GFR, Estimated: 13 mL/min — ABNORMAL LOW (ref >60)
Glucose, Bld: 78 mg/dL (ref 70–99)
Potassium: 4.6 mmol/L (ref 3.5–5.1)
Sodium: 141 mmol/L (ref 135–145)

## 2019-12-12 NOTE — ED Notes (Signed)
Pt called multiple times for vital check no response

## 2019-12-12 NOTE — ED Notes (Signed)
Called pt name x3 for VS recheck. No response from pt.  

## 2019-12-12 NOTE — ED Triage Notes (Signed)
Pt coming from home. Complaint of shortness of breath. Pt dialysis patient Tina Watkins, Sat. Last dialyzed Saturday.

## 2019-12-13 ENCOUNTER — Encounter (HOSPITAL_COMMUNITY): Payer: Self-pay | Admitting: Internal Medicine

## 2019-12-13 ENCOUNTER — Ambulatory Visit
Admission: RE | Admit: 2019-12-13 | Discharge: 2019-12-13 | Disposition: A | Discharge status: Home / Self Care | Payer: Medicaid Other | Source: Ambulatory Visit | Attending: Family Medicine | Admitting: Family Medicine

## 2019-12-13 ENCOUNTER — Ambulatory Visit (HOSPITAL_COMMUNITY)
Admission: RE | Admit: 2019-12-13 | Discharge: 2019-12-13 | Disposition: A | Discharge status: Home / Self Care | Payer: Medicare Other | Source: Ambulatory Visit | Attending: Internal Medicine | Admitting: Internal Medicine

## 2019-12-13 VITALS — BP 86/0 | HR 94 | Wt 102.0 lb

## 2019-12-13 DIAGNOSIS — Z8249 Family history of ischemic heart disease and other diseases of the circulatory system: Secondary | ICD-10-CM | POA: Insufficient documentation

## 2019-12-13 DIAGNOSIS — Z881 Allergy status to other antibiotic agents status: Secondary | ICD-10-CM | POA: Insufficient documentation

## 2019-12-13 DIAGNOSIS — I959 Hypotension, unspecified: Secondary | ICD-10-CM | POA: Diagnosis not present

## 2019-12-13 DIAGNOSIS — Z79899 Other long term (current) drug therapy: Secondary | ICD-10-CM | POA: Insufficient documentation

## 2019-12-13 DIAGNOSIS — I5032 Chronic diastolic (congestive) heart failure: Secondary | ICD-10-CM | POA: Diagnosis not present

## 2019-12-13 DIAGNOSIS — Z940 Kidney transplant status: Secondary | ICD-10-CM | POA: Insufficient documentation

## 2019-12-13 DIAGNOSIS — N186 End stage renal disease: Secondary | ICD-10-CM

## 2019-12-13 DIAGNOSIS — F17210 Nicotine dependence, cigarettes, uncomplicated: Secondary | ICD-10-CM | POA: Insufficient documentation

## 2019-12-13 DIAGNOSIS — Z01810 Encounter for preprocedural cardiovascular examination: Secondary | ICD-10-CM

## 2019-12-13 DIAGNOSIS — Z7982 Long term (current) use of aspirin: Secondary | ICD-10-CM | POA: Insufficient documentation

## 2019-12-13 DIAGNOSIS — I132 Hypertensive heart and chronic kidney disease with heart failure and with stage 5 chronic kidney disease, or end stage renal disease: Secondary | ICD-10-CM | POA: Diagnosis present

## 2019-12-13 DIAGNOSIS — R2681 Unsteadiness on feet: Secondary | ICD-10-CM

## 2019-12-13 DIAGNOSIS — M25561 Pain in right knee: Secondary | ICD-10-CM

## 2019-12-13 DIAGNOSIS — M11261 Other chondrocalcinosis, right knee: Secondary | ICD-10-CM | POA: Diagnosis not present

## 2019-12-13 MED ORDER — MIDODRINE HCL 10 MG PO TABS
15.0000 mg | ORAL_TABLET | Freq: Three times a day (TID) | ORAL | 1 refills | Status: DC
Start: 2019-12-13 — End: 2020-03-22

## 2019-12-13 NOTE — Progress Notes (Signed)
Pt was called to discuss her lab results. Pt states she understood and will keep her f/u appt. Pt also states we should be receiving something from [place] because she went today for her knee.

## 2019-12-13 NOTE — Progress Notes (Signed)
ADVANCED HF CLINIC CONSULT NOTE  Referring Physician: Dr. Vevelyn Royals Primary Cardiologist: [place]  Nephrologist: Dr. Royce Macadamia   HPI:  Tina Watkins is a 54 y.o. female with a hx of gout, gastroparesis, CVA, ESRD status post renal transplant, heart failure with preserved ejection fraction, hypertension who is being seen today for pre-op clearance for possible PD catheter placement at the request of Dr. Vevelyn Royals   In December gained a lot of fluid (~30 pounds) over the holidays and was referred to Dr. Audie Box. He recommended IV diuresis but she did not want to be admitted. Saw Dr. Marval Regal who switched to torsemide. She does not recall dose but says she is taking 1 in the morning and 1 at night. (likely 20 bid). No real effect. Weight down 3 pounds 130 -> 127. (baseline 112).   She was admitted to the hospital 2/15-19/21 for volume overload. Did very well with IV diuresis. Weigh down to 119 pounds. She did bump creatinine to 3.6 so diuretics held for a short time. RHC attempted but could not complete due to  Access challenges and inability to get into the PA. RA pressure was 3 and cardiac output was normal. There was evidence of multiple venous narrowings in the IVC.   Started on HD over the summer. Says fluid has been ok. Weight stable. Seen in ED yesterday with SOB and gastroparesis flare but left after several hours and went to HD. Says gastroparesis got better once they gave her O2. Tells me BP low at HD frequently and taking midodrine. Denies orthopnea or PND today. Feels fatigued. No CP   I spoke to Dr. Royce Macadamia by phone and she reports that weight has been stable but suspect she has lost muscle mass due to poor appetite and recent illnesses. She has run out of access sites and needs PD.   Review of Systems: [y] = yes, [ ]  = no    General: Weight gain [ ] ; Weight loss [ ] ; Anorexia [ y]; Fatigue [y]; Fever [ ] ; Chills [ ] ; Weakness Blue.Reese ]   Cardiac: Chest pain/pressure [ ] ;  Resting SOB [ y]; Exertional SOB [y]; Orthopnea [ ] ; Pedal Edema [y]; Palpitations [ ] ; Syncope [ ] ; Presyncope [ ] ; Paroxysmal nocturnal dyspnea[ ]    Pulmonary: Cough [ ] ; Wheezing[ ] ; Hemoptysis[ ] ; Sputum [ ] ; Snoring [ ]    GI: Vomiting[y ]; Dysphagia[ ] ; Melena[ ] ; Hematochezia [ ] ; Heartburn[ ] ; Abdominal pain [ y]; Constipation [ ] ; Diarrhea [ ] ; BRBPR [ ]    GU: Hematuria[ ] ; Dysuria [ ] ; Nocturia[ ]   Vascular: Pain in legs with walking [ ] ; Pain in feet with lying flat [ ] ; Non-healing sores [ ] ; Stroke [ ] ; TIA [ ] ; Slurred speech [ ] ;   Neuro: Headaches[ ] ; Vertigo[ ] ; Seizures[ ] ; Paresthesias[ ] ;Blurred vision [ ] ; Diplopia [ ] ; Vision changes [ ]    Ortho/Skin: Arthritis [y]; Joint pain [y]; Muscle pain [ ] ; Joint swelling [ ] ; Back Pain Blue.Reese ]; Rash [ ]    Psych: Depression[ y]; Anxiety[ ]    Heme: Bleeding problems [ ] ; Clotting disorders [ ] ; Anemia [ y]   Endocrine: Diabetes Blue.Reese ]; Thyroid dysfunction[ ]     Cardiac studies:   1. Left ventricular ejection fraction, by visual estimation, is 60 to  65%. The left ventricle has normal function. There is moderately increased  left ventricular hypertrophy.  2. Left ventricular diastolic parameters are consistent with Grade III  diastolic dysfunction (restrictive).  3. Elevated left ventricular end-diastolic  pressure.  4. Global right ventricle has normal systolic function.The right  ventricular size is normal. No increase in right ventricular wall  thickness.  5. Moderately elevated pulmonary artery systolic pressure.  6. The tricuspid regurgitant velocity is 3.22 m/s, and with an assumed  right atrial pressure of 3 mmHg, the estimated right ventricular systolic  pressure is moderately elevated at 44.6 mmHg.  7. The mitral valve is normal in structure. Trivial mitral valve  regurgitation. No evidence of mitral stenosis.  8. The tricuspid valve is normal in structure. Moderate TR.  9. The aortic valve has an  indeterminant number of cusps. Aortic valve  regurgitation is not visualized. Mild aortic valve sclerosis without  stenosis.    Past Medical History:  Diagnosis Date   Bacteremia due to Gram-negative bacteria 05/23/2011   Blind    right eye   Blind right eye    CHF (congestive heart failure) (HCC)    Chronic lower back pain    Complication of anesthesia    "woke up during OR; I have an extremely high tolerance" (12/11/2011) 1 procedure was graft; the other procedures were procedures that are typically done with sedation.   DDD (degenerative disc disease), cervical    Depression    Dysrhythmia    "tachycardia" (12/11/2011) new onset afib 10/15/14 EKG   E coli bacteremia 06/18/2011   Elevated LDL cholesterol level 12/2018   ESRD (end stage renal disease) ([place]) 06/12/2011   Fibromyalgia    Gastroparesis    Gastroparesis    GERD (gastroesophageal reflux disease)    Glaucoma    right eye   Gout    H/O carpal tunnel syndrome    Headache 10/2019   Headache(784.0)    "not often anymore" (12/11/2011)   Herpes genitalia 1994   History of blood transfusion    "more than a few times" (12/11/2011)   History of stomach ulcers    Hypotension    Iron deficiency anemia    New onset a-fib ([place])    10/15/14 EKG   Osteopenia    Peripheral neuropathy 11/2018   Pressure ulcer of sacral region, stage 1 07/2019   Seizures ([place]) 1994   "post transplant; only have had that one" (12/11/2011)   Spinal stenosis in cervical region    Stroke [facility])     left basal ganglia lacunar infarct; Right frontal lobe lacunar infarct.   Stroke [facility]) ~ 1999; 2001   "briefly lost my vision; lost my right eye" (12/11/2011)   Vitamin D deficiency 12/2018    Current Outpatient Medications  Medication Sig Dispense Refill   allopurinol (ZYLOPRIM) 100 MG tablet Take 1 tablet (100 mg total) by mouth daily. 30 tablet 0   aspirin EC 81 MG tablet Take 81 mg by mouth daily.      calcium  acetate, Phos Binder, (PHOSLYRA) 667 MG/5ML SOLN Take by mouth 3 (three) times daily with meals.     colchicine 0.6 MG tablet Take 0.5 tablets (0.3 mg total) by mouth daily. 30 tablet 0   diphenhydrAMINE (BENADRYL) 25 mg capsule Take 1 capsule (25 mg total) by mouth every 8 (eight) hours as needed. (Patient taking differently: Take 25 mg by mouth every 8 (eight) hours as needed for itching. ) 30 capsule 6   famotidine (PEPCID) 20 MG tablet Take 1 tablet (20 mg total) by mouth 2 (two) times daily. 60 tablet 11   gabapentin (NEURONTIN) 100 MG capsule Take 100 mg by mouth 2 (two) times daily.  HYDROmorphone (DILAUDID) 2 MG tablet Take 0.5 tablets (1 mg total) by mouth every 6 (six) hours as needed for severe pain. (Patient not taking: Reported on 10/20/2019) 10 tablet 0   latanoprost (XALATAN) 0.005 % ophthalmic solution Place 1 drop into the left eye at bedtime.  3   lidocaine (LIDODERM) 5 % Place 1 patch onto the skin daily as needed (for pain- remove old patch first).      Methoxy PEG-Epoetin Beta (MIRCERA IJ) Mircera     midodrine (PROAMATINE) 10 MG tablet Take 10 mg by mouth 3 (three) times daily.     ondansetron (ZOFRAN-ODT) 4 MG disintegrating tablet Take 1 tablet (4 mg total) by mouth every 8 (eight) hours as needed for nausea or vomiting. 20 tablet 11   oxyCODONE-acetaminophen (PERCOCET) 10-325 MG tablet Take 1 tablet by mouth 4 (four) times daily as needed.      predniSONE (DELTASONE) 5 MG tablet Take 5 mg by mouth daily with breakfast.     tacrolimus (PROGRAF) 1 MG capsule Take 2-3 mg by mouth See admin instructions. Take 3 mg by mouth in the morning and 2 mg at bedtime     topiramate (TOPAMAX) 50 MG tablet Take 1 tablet (50 mg total) by mouth 2 (two) times daily. (Patient not taking: Reported on 11/14/2019) 60 tablet 3   Vitamin D, Ergocalciferol, (DRISDOL) 1.25 MG (50000 UNIT) CAPS capsule Take 50,000 Units by mouth every Sunday.      zolpidem (AMBIEN) 10 MG tablet Take 1  tablet (10 mg total) by mouth at bedtime as needed for sleep. 10 tablet 0   No current facility-administered medications for this encounter.    Allergies  Allergen Reactions   Levofloxacin Itching and Rash   Tape Other (See Comments)    "Certain surgical tapes peel off my skin"      Social History   Socioeconomic History   Marital status: Single    Spouse name: Not on file   Number of children: 0   Years of education: some college   Highest education level: Not on file  Occupational History   Occupation: disabled    Employer: UNEMPLOYED  Tobacco Use   Smoking status: Current Every Day Smoker    Packs/day: 0.33    Years: 35.00    Pack years: 11.55    Types: Cigarettes   Smokeless tobacco: Never Used   Tobacco comment: has patches but not currently using  Vaping Use   Vaping Use: Never used  Substance and Sexual Activity   Alcohol use: Yes    Comment: rare if out to dinner   Drug use: Yes    Frequency: 1.0 times per week    Types: Marijuana    Comment: smoking once a day when in severe pain   Sexual activity: Yes    Partners: Male    Birth control/protection: None  Other Topics Concern   Not on file  Social History Narrative   Right-handed.   Four cups caffeine per day.   Lives alone.   Social Determinants of Health   Financial Resource Strain: High Risk   Difficulty of Paying Living Expenses: Very hard  Food Insecurity: No Food Insecurity   Worried About Charity fundraiser in the Last Year: Never true   Ran Out of Food in the Last Year: Never true  Transportation Needs: No Transportation Needs   Lack of Transportation (Medical): No   Lack of Transportation (Non-Medical): No  Physical Activity: Insufficiently Active   Days  of Exercise per Week: 3 days   Minutes of Exercise per Session: 10 min  Stress: Stress Concern Present   Feeling of Stress : To some extent  Social Connections: Moderately Isolated   Frequency of  Communication with Friends and Family: More than three times a week   Frequency of Social Gatherings with Friends and Family: More than three times a week   Attends Religious Services: More than 4 times per year   Active Member of [employer] or Organizations: No   Attends Archivist Meetings: Never   Marital Status: Never married  Human resources officer Violence: Not At Risk   Fear of Current or Ex-Partner: No   Emotionally Abused: No   Physically Abused: No   Sexually Abused: No      Family History  Problem Relation Age of Onset   Glaucoma Mother    Pancreatic cancer Father    Multiple sclerosis Brother    Hypertension Maternal Grandmother    Breast cancer Neg Hx     Vitals:   12/13/19 1449  BP: (!) 86/0  Pulse: 94  SpO2: 94%  Weight: 46.3 kg   PHYSICAL EXAM: General:  Weak appearing Fatigued. Sitting in chair No resp difficulty HEENT: normal R eye opacified Neck: supple. JVP to jawCarotids 2+ bilat; no bruits. No lymphadenopathy or thryomegaly appreciated. Cor: PMI nondisplaced. Regular rate & rhythm. No rubs, gallops or murmurs. Lungs: clear Abdomen: soft, nontender, + distended. No hepatosplenomegaly. No bruits or masses. Good bowel sounds. Extremities: no cyanosis, clubbing, rash, 3+ edema into thighs Neuro: alert & orientedx3, cranial nerves grossly intact. moves all 4 extremities w/o difficulty. Affect pleasant   ECG: NSR 72 No ST-T wave abnormalities. Mild QT prolongation QTC 420ms Personally reviewed    ASSESSMENT & PLAN:  1. Pre-op surgical risk stratification for PD catheter - she is moderate to high risk for peri-op CV complications for PD cath placement if GA required. That said, risk is not prohibitive and given lack of HD access sites needs to have catheter placed - Can proceed once volume status improved with HD (wil leave timing of this final approval to Nephrology)  2. Acute hronic diatolic heart failure with preserved ejection  fraction ([place]) - Echo 12/20 EF 60% RV normal. Grade 3 DD - admitted in 2/21 for IV diuresis weight down to 119. RHC with RA 3 and normal cardiac output. Unable to get into the PA.  - Markedly fluid overloaded.  - D/w Dr. Royce Macadamia and will increase HD regimen  3. Hypotension - MAP 86 today. Increase midodrine to 15 tid - Plcae compression stockings as toelrated  4. Tobacco use - smoking several cigarettes per day. encouraged her to quit - no change  5. ESRD - s/p previous failed renal transplant. - management as above   Glori Bickers, MD  1:15 PM

## 2019-12-13 NOTE — Patient Instructions (Signed)
Increase Midodrine to 15 mg (1 & 1/2 tabs) Three times a day   Follow up AS NEEDED

## 2019-12-14 ENCOUNTER — Telehealth: Payer: Self-pay

## 2019-12-14 NOTE — Telephone Encounter (Signed)
-----   Message from [name], [place] sent at 12/14/2019  3:47 PM EST ----- Noted. We will not be able to prescribe patient Lidocaine Pain Patches. However, we will refer her to Pain Management Clinic if she requests.

## 2019-12-15 ENCOUNTER — Other Ambulatory Visit: Payer: Self-pay | Admitting: Family Medicine

## 2019-12-15 DIAGNOSIS — R9389 Abnormal findings on diagnostic imaging of other specified body structures: Secondary | ICD-10-CM

## 2019-12-15 DIAGNOSIS — G8929 Other chronic pain: Secondary | ICD-10-CM

## 2019-12-15 DIAGNOSIS — M25562 Pain in left knee: Secondary | ICD-10-CM

## 2019-12-18 DIAGNOSIS — L821 Other seborrheic keratosis: Secondary | ICD-10-CM | POA: Diagnosis not present

## 2019-12-20 ENCOUNTER — Other Ambulatory Visit: Payer: Self-pay

## 2019-12-20 NOTE — Addendum Note (Signed)
Encounter addended by: Scarlette Calico, RN on: 12/20/2019 4:34 PM  Actions taken: Clinical Note Signed

## 2019-12-20 NOTE — Progress Notes (Signed)
OV note faxed to Dr Vevelyn Royals at [phone]

## 2019-12-20 NOTE — Patient Outreach (Signed)
Care Coordination- Social Work  12/20/2019  Tina Watkins 05-14-1965 [id number]  Subjective:    Tina Watkins is an 54 y.o. year old female who is a primary patient of [name], FNP.    [name] was given information about Medicaid Managed Care team care coordination services today. Tina Watkins agreed to services and verbal consent obtained  Review of patient status, laboratory and other test data was performed as part of evaluation for provision of services.  SDOH:   SDOH Screenings   Alcohol Screen: Low Risk   . Last Alcohol Screening Score (AUDIT): 1  Depression (PHQ2-9): Medium Risk  . PHQ-2 Score: 15  Financial Resource Strain: High Risk  . Difficulty of Paying Living Expenses: Very hard  Food Insecurity: No Food Insecurity  . Worried About Charity fundraiser in the Last Year: Never true  . Ran Out of Food in the Last Year: Never true  Housing: Low Risk   . Last Housing Risk Score: 0  Physical Activity: Insufficiently Active  . Days of Exercise per Week: 3 days  . Minutes of Exercise per Session: 10 min  Social Connections: Moderately Isolated  . Frequency of Communication with Friends and Family: More than three times a week  . Frequency of Social Gatherings with Friends and Family: More than three times a week  . Attends Religious Services: More than 4 times per year  . Active Member of Clubs or Organizations: No  . Attends Archivist Meetings: Never  . Marital Status: Never married  Stress: Stress Concern Present  . Feeling of Stress : To some extent  Tobacco Use: High Risk  . Smoking Tobacco Use: Current Every Day Smoker  . Smokeless Tobacco Use: Never Used  Transportation Needs: No Transportation Needs  . Lack of Transportation (Medical): No  . Lack of Transportation (Non-Medical): No     Objective:    Medications:  Medications Reviewed Today    Reviewed by Stanford Scotland, RN (Registered  Nurse) on 12/13/19 at 1454  Med List Status: <None>  Medication Order Taking? Sig Documenting Provider Last Dose Status Informant  allopurinol (ZYLOPRIM) 100 MG tablet [id number] Yes Take 1 tablet (100 mg total) by mouth daily. [name], DO Taking Active Self  aspirin EC 81 MG tablet [id number] Yes Take 81 mg by mouth daily.  [provider] Taking Active Self  calcium acetate, Phos Binder, (PHOSLYRA) 667 MG/5ML SOLN [id number] Yes Take by mouth 3 (three) times daily with meals. [provider] Taking Active   colchicine 0.6 MG tablet [id number] Yes Take 0.5 tablets (0.3 mg total) by mouth daily. [name], MD Taking Active Self           Med Note [name], Benjamine Mola A   Fri Sep 15, 2019  8:21 AM) Just takes when has gout flare  diphenhydrAMINE (BENADRYL) 25 mg capsule [id number] Yes Take 1 capsule (25 mg total) by mouth every 8 (eight) hours as needed.  Patient taking differently: Take 25 mg by mouth every 8 (eight) hours as needed for itching.    [name], FNP Taking Active Self  famotidine (PEPCID) 20 MG tablet [id number] Yes Take 1 tablet (20 mg total) by mouth 2 (two) times daily. [name], FNP Taking Active Self  gabapentin (NEURONTIN) 100 MG capsule [id number] Yes Take 100 mg by mouth 2 (two) times daily.  [provider] Taking Active Self       Patient not taking:  Discontinued 12/13/19 1451   latanoprost (XALATAN) 0.005 % ophthalmic solution [id number] Yes Place 1 drop into the left eye at bedtime. [provider] Taking Active Self           Med Note [name] Apr 17, 2019  6:42 PM)    lidocaine (LIDODERM) 5 % [id number] Yes Place 1 patch onto the skin daily as needed (for pain- remove old patch first).  [provider] Taking Active Self  Methoxy PEG-Epoetin Angus Seller) [id number] Yes Mircera [provider] Taking Active   midodrine (PROAMATINE) 10 MG tablet [id number] Yes Take 10 mg by  mouth 3 (three) times daily. [provider] Taking Active Self  ondansetron (ZOFRAN-ODT) 4 MG disintegrating tablet [id number] Yes Take 1 tablet (4 mg total) by mouth every 8 (eight) hours as needed for nausea or vomiting. [name], FNP Taking Active Self  oxyCODONE-acetaminophen (PERCOCET) 10-325 MG tablet [id number] Yes Take 1 tablet by mouth 4 (four) times daily as needed.  [provider] Taking Active   predniSONE (DELTASONE) 5 MG tablet [id number] Yes Take 5 mg by mouth daily with breakfast. [provider] Taking Active   tacrolimus (PROGRAF) 1 MG capsule [id number] Yes Take 2-3 mg by mouth See admin instructions. Take 3 mg by mouth in the morning and 2 mg at bedtime [name], MD Taking Active Self           Med Note [name] Apr 17, 2019  6:42 PM)         Patient not taking:      Discontinued 12/13/19 1452   Vitamin D, Ergocalciferol, (DRISDOL) 1.25 MG (50000 UNIT) CAPS capsule [id number] Yes Take 50,000 Units by mouth every Sunday.  [provider] Taking Active Self  zolpidem (AMBIEN) 10 MG tablet [id number] Yes Take 1 tablet (10 mg total) by mouth at bedtime as needed for sleep. [name], DO Taking Active Self          Fall/Depression Screening:  Fall Risk  10/20/2019 06/13/2019 05/08/2019  Falls in the past year? 0 0 0  Number falls in past yr: 0 0 0  Injury with Fall? 0 0 0   PHQ 2/9 Scores 11/22/2019 10/23/2019 10/20/2019 06/26/2014  PHQ - 2 Score 3 4 3 6   PHQ- 9 Score 15 12 14  -    Assessment:  Goals Addressed              This Visit's Progress   .  "I want an aide to come to the home to help me a little bit with some light housework and meal preparation." (pt-stated)        [place] (see longitudinal plan of care for additional care plan information)  Current Barriers:  . Over the next 60 days, patient will work with CM team to address in-home personal care needs.  Patient requests additional assistance with ADL/IADL. Home health PT consult pending.  Clinical Social Work Clinical Goal(s):  [name] Over the next 30 days, patient will work with CM team to address needs related to in-home personal care needs.  Patient discharged from rehab in late August and is currently residing at home alone and receives no services. LCSW will collaborate with PCP to address her needs.  Interventions: . Inter-disciplinary care team collaboration (see longitudinal plan of care) . Patient interviewed and appropriate assessments performed . Provided education to patient/caregiver regarding level of care  options. Patient may be eligible for [facility] services; will collaborate with home health PT regarding personal care needs.  Update:  RNCM spoke to patient regarding PCS and she has not received any updates to date-will follow up with PCP. [name] Update 11/22/2019: LCSW will collaborate with BSW and PCP regarding PCS services. . BSW contacted [employer] at [phone] for [place] stated that PCP has to complete referral form. [name] Update 12/20/19 Patient states that she is now getting PCS services MWF.  Patient Self Care Activities:  . Patient will work with CM team to address personal care needs. . Licensed Clinical Social Worker will collaborate with MM care team and PCP office to address personal care needs.  Please see past updates related to this goal by clicking on the "Past Updates" button in the selected goal      .  outpatient therapy to help with depression symptoms        [place] (see longitudinal plan of care for additional care plan information)  Current Barriers:  . Community resources access barrier: Patient Lacks knowledge of community resource: mental health outpatient therapy providers. Patient has specific requests for a provider: older, female, have some basis with Christianity, and preferably in-person therapy sessions.  Patient was initially reluctant because she did not understand how therapy could help. However, after discussion, she agreed to try therapy. . Patient needs assistance with appointment scheduling and follow up with community [facility], [phone]. . Limited social support . Medical/physical health concerns. 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  Clinical Social Work Clinical Goal(s):  [name] Over the next 60 days, patient will work with SW to address concerns related to depression and scheduling appointment with outpatient therapy provider. . Over the next 30-60 days, patient will work with BSW to address needs related to scheduling appointment with an outpatient mental health therapy provider.  Interventions: . Inter-disciplinary care team collaboration (see longitudinal plan of care) . Patient interviewed and appropriate assessments performed . Referred patient to community resources care guide team for assistance with scheduling appointment for mental health therapy. . Provided mental health counseling with regard to depression, being alone and the importance of a support system. . Discussed plans with patient for ongoing care management follow up and provided patient with direct contact information for care management team . Collaborated with BSW re: outpatient therapy needs . BSW contacted [facility] for outpatient 12/11/19 at 10:20 am for an assessment. Update 12/20/19 Patient states she missed her appointment due to being sick, however they have called her and left a message to reschedule.   Patient Self Care Activities:  . Patient will collaborate with the Managed Medicaid Care Team . Patient will attend all scheduled appointments. . Licensed Clinical Social Worker will refer patient to BSW for assistance scheduling appointment at [employer] at [phone]. . Licensed Clinical Social  Worker will follow-up on December 25, 2019 @ 11:00am.  Initial goal documentation        Plan: Patient will return telephone call [facility] to reschedule therapy appointment.

## 2019-12-20 NOTE — Patient Instructions (Signed)
Visit Information  Tina Watkins was given information about Medicaid Managed Care team care coordination services as a part of their Healthy [facility] Medicaid benefit. Tina Watkins verbally consented to engagement with the [facility] Managed Care team.   For questions related to your Healthy [facility] health plan, please call: [phone] or visit the homepage here: [email or web address]  If you would like to schedule transportation through your Healthy [facility] plan, please call the following number at least 2 days in advance of your appointment: [phone]  Goals Addressed              This Visit's Progress   .  "I want an aide to come to the home to help me a little bit with some light housework and meal preparation." (pt-stated)        [place] (see longitudinal plan of care for additional care plan information)  Current Barriers:  . Over the next 60 days, patient will work with CM team to address in-home personal care needs. Patient requests additional assistance with ADL/IADL. Home health PT consult pending.  Clinical Social Work Clinical Goal(s):  [name] Over the next 30 days, patient will work with CM team to address needs related to in-home personal care needs.  Patient discharged from rehab in late August and is currently residing at home alone and receives no services. LCSW will collaborate with PCP to address her needs.  Interventions: . Inter-disciplinary care team collaboration (see longitudinal plan of care) . Patient interviewed and appropriate assessments performed . Provided education to patient/caregiver regarding level of care options. Patient may be eligible for [facility] services; will collaborate with home health PT regarding personal care needs.  Update:  RNCM spoke to patient regarding PCS and she has not received any updates to date-will follow up with PCP. [name] Update 11/22/2019: LCSW will  collaborate with BSW and PCP regarding PCS services. . BSW contacted [employer] at [phone] for [place] stated that PCP has to complete referral form. [name] Update 12/20/19 Patient states that she is now getting PCS services MWF.  Patient Self Care Activities:  . Patient will work with CM team to address personal care needs. . Licensed Clinical Social Worker will collaborate with MM care team and PCP office to address personal care needs.  Please see past updates related to this goal by clicking on the "Past Updates" button in the selected goal      .  outpatient therapy to help with depression symptoms        [place] (see longitudinal plan of care for additional care plan information)  Current Barriers:  . Community resources access barrier: Patient Lacks knowledge of community resource: mental health outpatient therapy providers. Patient has specific requests for a provider: older, female, have some basis with Christianity, and preferably in-person therapy sessions. Patient was initially reluctant because she did not understand how therapy could help. However, after discussion, she agreed to try therapy. . Patient needs assistance with appointment scheduling and follow up with community [facility], [phone]. . Limited social support . Medical/physical health concerns. 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  Clinical Social Work Clinical Goal(s):  [name] Over the next 60 days, patient will work with SW to address concerns related to depression and scheduling appointment with outpatient therapy provider. . Over the next 30-60 days, patient will work with BSW to address needs related to  scheduling appointment with an outpatient mental health therapy provider.  Interventions: . Inter-disciplinary care team collaboration (see longitudinal plan of care) . Patient interviewed  and appropriate assessments performed . Referred patient to community resources care guide team for assistance with scheduling appointment for mental health therapy. . Provided mental health counseling with regard to depression, being alone and the importance of a support system. . Discussed plans with patient for ongoing care management follow up and provided patient with direct contact information for care management team . Collaborated with BSW re: outpatient therapy needs . BSW contacted [facility] for outpatient 12/11/19 at 10:20 am for an assessment. Update 12/20/19 Patient states she missed her appointment due to being sick, however they have called her and left a message to reschedule.   Patient Self Care Activities:  . Patient will collaborate with the Managed Medicaid Care Team . Patient will attend all scheduled appointments. . Licensed Clinical Social Worker will refer patient to BSW for assistance scheduling appointment at [employer] at [phone]. . Licensed Clinical Social Worker will follow-up on December 25, 2019 @ 11:00am.  Initial goal documentation        Social Worker will follow up with patient in 30 days.   Mickel Fuchs, BSW, [place]  High Risk Managed Medicaid Team

## 2019-12-21 ENCOUNTER — Telehealth: Payer: Self-pay | Admitting: Family Medicine

## 2019-12-21 NOTE — Telephone Encounter (Signed)
Error

## 2019-12-22 ENCOUNTER — Other Ambulatory Visit: Payer: Self-pay

## 2019-12-22 NOTE — Patient Instructions (Signed)
Hi Tina Watkins-thank you for speaking with me today.  Tina Watkins was given information about Medicaid Managed Care team care coordination services as a part of their Healthy [facility] Medicaid benefit. Tina Watkins verbally consented to engagement with the [facility] Managed Care team.   For questions related to your Healthy [facility] health plan, please call: [phone] or visit the homepage here: [email or web address]  If you would like to schedule transportation through your Healthy [facility] plan, please call the following number at least 2 days in advance of your appointment: [phone]  Goals Addressed              This Visit's Progress     COMPLETED: "I want an aide to come to the home to help me a little bit with some light housework and meal preparation." (pt-stated)        [place] (see longitudinal plan of care for additional care plan information)  Current Barriers:   Over the next 60 days, patient will work with CM team to address in-home personal care needs. Patient requests additional assistance with ADL/IADL. Home health PT consult pending.Update 12/22/19-patient states she does not need HHPT services right now.  Clinical Social Work Clinical Goal(s):   Over the next 30 days, patient will work with CM team to address needs related to in-home personal care needs.  Patient discharged from rehab in late August and is currently residing at home alone and receives no services. LCSW will collaborate with PCP to address her needs.  Interventions:  Inter-disciplinary care team collaboration (see longitudinal plan of care)  Patient interviewed and appropriate assessments performed  Provided education to patient/caregiver regarding level of care options. Patient may be eligible for [facility] services; will collaborate with home health PT regarding personal care needs.  Update:  RNCM spoke to patient  regarding PCS and she has not received any updates to date-will follow up with PCP.  Update 11/22/2019: LCSW will collaborate with BSW and PCP regarding PCS services.  BSW contacted [employer] at [phone] for [place] stated that PCP has to complete referral form.  Update 12/20/19 Patient states that she is now getting PCS services MWF. Update 12/22/19-Patient receiving house keeping and food preparation services-she is very pleased with these services  Patient Self Care Activities:   Patient will work with CM team to address personal care needs.  Licensed Clinical Social Worker will collaborate with MM care team and PCP office to address personal care needs.  Please see past updates related to this goal by clicking on the "Past Updates" button in the selected goal        "I want to get some physical therapy when I am back at home. I want to be able to walk normal without any type of aid to assist me." (pt-stated)        [place] Medicaid Managed Care (see longitudinal plan of care for additional care plan information)  Current Barriers:   Care coordination needs related to physical mobility and therapy need  Patient discharged from [facility] after short-term stay for rehab. Her facility SW states she was discharged with home health - unable to determine agency. Patient has not started services with an agency.  Update 11/22/2019: Patient stated she has not heard anything from her PCP regarding physical therapy nor has she been contacted.   Update 12/22/19: Patient states she does not need HHPT services at this time  Clinical Social  Work Clinical Goal(s):   Over the next 14 days, patient will work with CM team to address needs related to home physical therapy.  Patient needs help her with her back pain.  Update 11/22/2019: Over the next 60 days patient will work with the Managed Medicaid team to address her needs related to home physical  therapy.  Update 12/22/19:  Patient states she does not need HHPT services right now-she utilizes walker and cane as needed in addition to prescribed medications.   Interventions:  Inter-disciplinary care team collaboration (see longitudinal plan of care)  Patient interviewed and appropriate assessments performed  Referred to BSW colleague for follow-up regarding home care needs.    Update 11/22/2019: LCSW will collaborate with PCP regarding patient's concern and explain that a referral may need to be resubmitted.  Update 12/22/19:  Patient states she would like additional DME-walker with seat.  RNCM will contact PCP regarding this possibility.  Patient Self Care Activities:   Patient will work with CM team over the next 30 days.  Update 11/22/2019:  Patient stated she doesn't know the reason she has not been contacted about physical therapy.   Please see past updates related to this goal by clicking on the "Past Updates" button in the selected goal        outpatient therapy to help with depression symptoms        CARE PLAN ENTRY Medicaid Managed Care (see longitudinal plan of care for additional care plan information)  Current Barriers:   Community resources access barrier: Patient Lacks knowledge of community resource: mental health outpatient therapy providers. Patient has specific requests for a provider: older, female, have some basis with Christianity, and preferably in-person therapy sessions. Patient was initially reluctant because she did not understand how therapy could help. However, after discussion, she agreed to try therapy.  Patient needs assistance with appointment scheduling and follow up with community [facility], [phone].  Limited social support  Medical/physical health concerns. 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  Clinical Social Work Clinical Goal(s):   Over the next 60 days,  patient will work with SW to address concerns related to depression and scheduling appointment with outpatient therapy provider.  Over the next 30-60 days, patient will work with BSW to address needs related to scheduling appointment with an outpatient mental health therapy provider.  Interventions:  Inter-disciplinary care team collaboration (see longitudinal plan of care)  Patient interviewed and appropriate assessments performed  Referred patient to community resources care guide team for assistance with scheduling appointment for mental health therapy.  Provided mental health counseling with regard to depression, being alone and the importance of a support system.  Discussed plans with patient for ongoing care management follow up and provided patient with direct contact information for care management team  Collaborated with BSW re: outpatient therapy needs  BSW contacted [facility] for outpatient 12/11/19 at 10:20 am for an assessment. Update 12/20/19 Patient states she missed her appointment due to being sick, however they have called her and left a message to reschedule.   Update 12/22/19:  Patient states she is waiting on a return call from [facility] to reschedule appointment.  Encouraged patient to call and reschedule if she does not hear anything from them-verified phone number with patient. Patient Self Care Activities:   Patient will collaborate with the Managed [facility] Care Team  Patient will attend all scheduled appointments.  Licensed Clinical Social  Worker will refer patient to BSW for assistance scheduling appointment at [employer] at [phone].  Licensed Clinical Social Worker will follow-up on December 25, 2019 @ 11:00am.  Please see past updates related to this goal by clicking on the "Past Updates" button in the selected goal       Patient verbalizes understanding of instructions provided today.   RN Care  Manager will follow up with patient within the next 30 days and will contact patient's PCP regarding patient's request for DME. The patient has been provided with contact information for the Managed Medicaid care management team and has been advised to call with any health related questions or concerns.   Aida Raider RN, BSN [place]   [facility] - Managed Medicaid High Risk [phone].

## 2019-12-22 NOTE — Patient Outreach (Signed)
Care Coordination - Case Manager  12/22/2019  Desmond Tufano 04-18-1965 [id number]  Subjective:  Tina Watkins is an 54 y.o. year old female who is a primary patient of [name], FNP.  Ms. Antonetti was given information about Medicaid Managed Care team care coordination services today. [name] agreed to services and verbal consent obtained  Review of patient status, laboratory and other test data was performed as part of evaluation for provision of services.  SDOH: SDOH Screenings   Alcohol Screen: Low Risk   . Last Alcohol Screening Score (AUDIT): 1  Depression (PHQ2-9): Medium Risk  . PHQ-2 Score: 15  Financial Resource Strain: High Risk  . Difficulty of Paying Living Expenses: Very hard  Food Insecurity: No Food Insecurity  . Worried About Charity fundraiser in the Last Year: Never true  . Ran Out of Food in the Last Year: Never true  Housing: Low Risk   . Last Housing Risk Score: 0  Physical Activity: Insufficiently Active  . Days of Exercise per Week: 3 days  . Minutes of Exercise per Session: 10 min  Social Connections: Moderately Isolated  . Frequency of Communication with Friends and Family: More than three times a week  . Frequency of Social Gatherings with Friends and Family: More than three times a week  . Attends Religious Services: More than 4 times per year  . Active Member of Clubs or Organizations: No  . Attends Archivist Meetings: Never  . Marital Status: Never married  Stress: Stress Concern Present  . Feeling of Stress : To some extent  Tobacco Use: High Risk  . Smoking Tobacco Use: Current Every Day Smoker  . Smokeless Tobacco Use: Never Used  Transportation Needs: No Transportation Needs  . Lack of Transportation (Medical): No  . Lack of Transportation (Non-Medical): No     Objective:    Allergies  Allergen Reactions  . Levofloxacin Itching and Rash  . Tape Other (See Comments)     "Certain surgical tapes peel off my skin"    Medications:    Medications Reviewed Today    Reviewed by Gayla Medicus, RN (Registered Nurse) on 12/22/19 at [phone]  Med List Status: <None>  Medication Order Taking? Sig Documenting Provider Last Dose Status Informant  allopurinol (ZYLOPRIM) 100 MG tablet [id number]  Take 1 tablet (100 mg total) by mouth daily. [name], Pratik D, DO  Expired 12/13/19 2359 Self  aspirin EC 81 MG tablet [id number] Yes Take 81 mg by mouth daily.  [provider] Taking Active Self  calcium acetate, Phos Binder, (PHOSLYRA) 667 MG/5ML SOLN [id number] Yes Take by mouth 3 (three) times daily with meals. [provider] Taking Active   colchicine 0.6 MG tablet [id number] Yes Take 0.5 tablets (0.3 mg total) by mouth daily. [name], MD Taking Active Self           Med Note [name], Benjamine Mola A   Fri Sep 15, 2019  8:21 AM) Just takes when has gout flare  diphenhydrAMINE (BENADRYL) 25 mg capsule [id number] Yes Take 1 capsule (25 mg total) by mouth every 8 (eight) hours as needed.  Patient taking differently: Take 25 mg by mouth every 8 (eight) hours as needed for itching.    [name], FNP Taking Active Self  famotidine (PEPCID) 20 MG tablet [id number] Yes Take 1 tablet (20 mg total) by mouth 2 (two) times daily. [name], FNP Taking Active Self  gabapentin (NEURONTIN) 100 MG  capsule [id number] Yes Take 100 mg by mouth 2 (two) times daily.  [provider] Taking Active Self  latanoprost (XALATAN) 0.005 % ophthalmic solution [id number] Yes Place 1 drop into the left eye at bedtime. [provider] Taking Active Self           Med Note [name] Apr 17, 2019  6:42 PM)    lidocaine (LIDODERM) 5 % [id number] Yes Place 1 patch onto the skin daily as needed (for pain- remove old patch first).  [provider] Taking Active Self  Methoxy PEG-Epoetin Angus Seller) [id number] Yes Mircera [provider]  Taking Active   midodrine (PROAMATINE) 10 MG tablet [id number] Yes Take 1.5 tablets (15 mg total) by mouth 3 (three) times daily.  Patient taking differently: Take 15 mg by mouth 3 (three) times daily. Patient taking 1.0mg    Bensimhon, Shaune Pascal, MD Taking Active   ondansetron (ZOFRAN-ODT) 4 MG disintegrating tablet [id number] Yes Take 1 tablet (4 mg total) by mouth every 8 (eight) hours as needed for nausea or vomiting. [name], FNP Taking Active Self  oxyCODONE-acetaminophen (PERCOCET) 10-325 MG tablet [id number] Yes Take 1 tablet by mouth 4 (four) times daily as needed.  [provider] Taking Active   predniSONE (DELTASONE) 5 MG tablet [id number] Yes Take 5 mg by mouth daily with breakfast. [provider] Taking Active   tacrolimus (PROGRAF) 1 MG capsule [id number] Yes Take 2-3 mg by mouth See admin instructions. Take 3 mg by mouth in the morning and 2 mg at bedtime [name], MD Taking Active Self           Med Note [name] Apr 17, 2019  6:42 PM)    Vitamin D, Ergocalciferol, (DRISDOL) 1.25 MG (50000 UNIT) CAPS capsule [id number] Yes Take 50,000 Units by mouth every Sunday.  [provider] Taking Active Self  zolpidem (AMBIEN) 10 MG tablet [id number] Yes Take 1 tablet (10 mg total) by mouth at bedtime as needed for sleep. [name], DO Taking Active Self          Assessment:   Goals Addressed              This Visit's Progress   .  COMPLETED: "I want an aide to come to the home to help me a little bit with some light housework and meal preparation." (pt-stated)        CARE PLAN ENTRY Medicaid Managed Care (see longitudinal plan of care for additional care plan information)  Current Barriers:  . Over the next 60 days, patient will work with CM team to address in-home personal care needs. Patient requests additional assistance with ADL/IADL. Home health PT consult pending.Update 12/22/19-patient states she does not need  HHPT services right now.  Clinical Social Work Clinical Goal(s):  [name] Over the next 30 days, patient will work with CM team to address needs related to in-home personal care needs.  Patient discharged from rehab in late August and is currently residing at home alone and receives no services. LCSW will collaborate with PCP to address her needs.  Interventions: . Inter-disciplinary care team collaboration (see longitudinal plan of care) . Patient interviewed and appropriate assessments performed . Provided education to patient/caregiver regarding level of care options. Patient may be eligible for [facility] services; will collaborate with home health PT regarding personal care needs.  Update:  RNCM spoke to patient regarding PCS and she has not received  any updates to date-will follow up with PCP. [name] Update 11/22/2019: LCSW will collaborate with BSW and PCP regarding PCS services. . BSW contacted [employer] at [phone] for [place] stated that PCP has to complete referral form. [name] Update 12/20/19 Patient states that she is now getting PCS services MWF. Update 12/22/19-Patient receiving house keeping and food preparation services-she is very pleased with these services  Patient Self Care Activities:  . Patient will work with CM team to address personal care needs. . Licensed Clinical Social Worker will collaborate with MM care team and PCP office to address personal care needs.  Please see past updates related to this goal by clicking on the "Past Updates" button in the selected goal      .  "I want to get some physical therapy when I am back at home. I want to be able to walk normal without any type of aid to assist me." (pt-stated)        [place] Medicaid Managed Care (see longitudinal plan of care for additional care plan information)  Current Barriers:  . Care coordination needs related to physical mobility and therapy need . Patient discharged from [facility] SN[facility]  after short-term stay for rehab. Her facility SW states she was discharged with home health - unable to determine agency. Patient has not started services with an agency. [name] Update 11/22/2019: Patient stated she has not heard anything from her PCP regarding physical therapy nor has she been contacted.  [name] Update 12/22/19: Patient states she does not need HHPT services at this time  Clinical Social Work Clinical Goal(s):  [name] Over the next 14 days, patient will work with CM team to address needs related to home physical therapy.  Patient needs help her with her back pain. [name] Update 11/22/2019: Over the next 60 days patient will work with the Managed Medicaid team to address her needs related to home physical therapy. [name] Update 12/22/19:  Patient states she does not need HHPT services right now-she utilizes walker and cane as needed in addition to prescribed medications.   Interventions: . Inter-disciplinary care team collaboration (see longitudinal plan of care) . Patient interviewed and appropriate assessments performed . Referred to [place] colleague for follow-up regarding home care needs.   [name] Update 11/22/2019: LCSW will collaborate with PCP regarding patient's concern and explain that a referral may need to be resubmitted. [name] Update 12/22/19:  Patient states she would like additional DME-walker with seat.  RNCM will contact PCP regarding this possibility.  Patient Self Care Activities:  . Patient will work with CM team over the next 30 days. [name] Update 11/22/2019:  Patient stated she doesn't know the reason she has not been contacted about physical therapy.   Please see past updates related to this goal by clicking on the "Past Updates" button in the selected goal      .  outpatient therapy to help with depression symptoms        [place] (see longitudinal plan of care for additional care plan information)  Current Barriers:  . Community resources access barrier:  Patient Lacks knowledge of community resource: mental health outpatient therapy providers. Patient has specific requests for a provider: older, female, have some basis with Christianity, and preferably in-person therapy sessions. Patient was initially reluctant because she did not understand how therapy could help. However, after discussion, she agreed to try therapy. . Patient needs assistance with appointment scheduling and follow up  with community [employer], [phone]. . Limited social support . Medical/physical health concerns. 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  Clinical Social Work Clinical Goal(s):  [name] Over the next 60 days, patient will work with SW to address concerns related to depression and scheduling appointment with outpatient therapy provider. . Over the next 30-60 days, patient will work with BSW to address needs related to scheduling appointment with an outpatient mental health therapy provider.  Interventions: . Inter-disciplinary care team collaboration (see longitudinal plan of care) . Patient interviewed and appropriate assessments performed . Referred patient to community resources care guide team for assistance with scheduling appointment for mental health therapy. . Provided mental health counseling with regard to depression, being alone and the importance of a support system. . Discussed plans with patient for ongoing care management follow up and provided patient with direct contact information for care management team . Collaborated with BSW re: outpatient therapy needs . BSW contacted [facility] for outpatient 12/11/19 at 10:20 am for an assessment. Update 12/20/19 Patient states she missed her appointment due to being sick, however they have called her and left a message to reschedule.  [name] Update 12/22/19:  Patient states she is waiting on a return call from [facility] to reschedule appointment.  Encouraged patient to call and reschedule if she does not hear anything from them-verified phone number with patient. Patient Self Care Activities:  . Patient will collaborate with the Managed Medicaid Care Team . Patient will attend all scheduled appointments. . Licensed Clinical Social Worker will refer patient to BSW for assistance scheduling appointment at [employer] at [phone]. . Licensed Clinical Social Worker will follow-up on December 25, 2019 @ 11:00am.  Please see past updates related to this goal by clicking on the "Past Updates" button in the selected goal        Plan: RNCM will follow up with patient within 30 days.  RNCM will follow up with PCP regarding patient's DME request.

## 2019-12-25 ENCOUNTER — Other Ambulatory Visit: Payer: Self-pay

## 2019-12-25 ENCOUNTER — Ambulatory Visit: Payer: Self-pay

## 2019-12-25 NOTE — Patient Outreach (Signed)
Care Coordination- Social Work  12/25/2019  Tina Watkins August 26, 1965 [id number]  Subjective:    Tina Watkins is an 54 y.o. year old female who is a primary patient of [name], FNP.    Tina Watkins was given information about Medicaid Managed Care team care coordination services today. Tina Watkins agreed to services and verbal consent obtained  Review of patient status, laboratory and other test data was performed as part of evaluation for provision of services.  SDOH:   SDOH Screenings   Alcohol Screen: Low Risk   . Last Alcohol Screening Score (AUDIT): 1  Depression (PHQ2-9): Medium Risk  . PHQ-2 Score: 13  Financial Resource Strain: High Risk  . Difficulty of Paying Living Expenses: Very hard  Food Insecurity: No Food Insecurity  . Worried About Charity fundraiser in the Last Year: Never true  . Ran Out of Food in the Last Year: Never true  Housing: Low Risk   . Last Housing Risk Score: 0  Physical Activity: Insufficiently Active  . Days of Exercise per Week: 3 days  . Minutes of Exercise per Session: 10 min  Social Connections: Moderately Isolated  . Frequency of Communication with Friends and Family: More than three times a week  . Frequency of Social Gatherings with Friends and Family: More than three times a week  . Attends Religious Services: More than 4 times per year  . Active Member of Clubs or Organizations: No  . Attends Archivist Meetings: Never  . Marital Status: Never married  Stress: Stress Concern Present  . Feeling of Stress : To some extent  Tobacco Use: High Risk  . Smoking Tobacco Use: Current Every Day Smoker  . Smokeless Tobacco Use: Never Used  Transportation Needs: No Transportation Needs  . Lack of Transportation (Medical): No  . Lack of Transportation (Non-Medical): No   SDOH Interventions     Most Recent Value  SDOH Interventions  Depression Interventions/Treatment  Counseling   [Patient to begin therapy at [facility] 12/27/2019.]      Objective:    Medications:  Medications Reviewed Today    Reviewed by Tina Medicus, RN (Registered Nurse) on 12/22/19 at [phone]  Med List Status: <None>  Medication Order Taking? Sig Documenting Provider Last Dose Status Informant  allopurinol (ZYLOPRIM) 100 MG tablet [id number]  Take 1 tablet (100 mg total) by mouth daily. [name], Pratik D, DO  Expired 12/13/19 2359 Self  aspirin EC 81 MG tablet [id number] Yes Take 81 mg by mouth daily.  [provider] Taking Active Self  calcium acetate, Phos Binder, (PHOSLYRA) 667 MG/5ML SOLN [id number] Yes Take by mouth 3 (three) times daily with meals. [provider] Taking Active   colchicine 0.6 MG tablet [id number] Yes Take 0.5 tablets (0.3 mg total) by mouth daily. [name], MD Taking Active Self           Med Note [name], Benjamine Mola A   Fri Sep 15, 2019  8:21 AM) Just takes when has gout flare  diphenhydrAMINE (BENADRYL) 25 mg capsule [id number] Yes Take 1 capsule (25 mg total) by mouth every 8 (eight) hours as needed.  Patient taking differently: Take 25 mg by mouth every 8 (eight) hours as needed for itching.    [name], FNP Taking Active Self  famotidine (PEPCID) 20 MG tablet [id number] Yes Take 1 tablet (20 mg total) by mouth 2 (two) times daily. [name], FNP Taking Active Self  gabapentin (NEURONTIN)  100 MG capsule [id number] Yes Take 100 mg by mouth 2 (two) times daily.  [provider] Taking Active Self  latanoprost (XALATAN) 0.005 % ophthalmic solution [id number] Yes Place 1 drop into the left eye at bedtime. [provider] Taking Active Self           Med Note [name] Apr 17, 2019  6:42 PM)    lidocaine (LIDODERM) 5 % [id number] Yes Place 1 patch onto the skin daily as needed (for pain- remove old patch first).  [provider] Taking Active Self  Methoxy PEG-Epoetin Angus Seller) [id number] Yes  Mircera [provider] Taking Active   midodrine (PROAMATINE) 10 MG tablet [id number] Yes Take 1.5 tablets (15 mg total) by mouth 3 (three) times daily.  Patient taking differently: Take 15 mg by mouth 3 (three) times daily. Patient taking 1.0mg    Bensimhon, Shaune Pascal, MD Taking Active   ondansetron (ZOFRAN-ODT) 4 MG disintegrating tablet [id number] Yes Take 1 tablet (4 mg total) by mouth every 8 (eight) hours as needed for nausea or vomiting. [name], FNP Taking Active Self  oxyCODONE-acetaminophen (PERCOCET) 10-325 MG tablet [id number] Yes Take 1 tablet by mouth 4 (four) times daily as needed.  [provider] Taking Active   predniSONE (DELTASONE) 5 MG tablet [id number] Yes Take 5 mg by mouth daily with breakfast. [provider] Taking Active   tacrolimus (PROGRAF) 1 MG capsule [id number] Yes Take 2-3 mg by mouth See admin instructions. Take 3 mg by mouth in the morning and 2 mg at bedtime [name], MD Taking Active Self           Med Note [name] Apr 17, 2019  6:42 PM)    Vitamin D, Ergocalciferol, (DRISDOL) 1.25 MG (50000 UNIT) CAPS capsule [id number] Yes Take 50,000 Units by mouth every Sunday.  [provider] Taking Active Self  zolpidem (AMBIEN) 10 MG tablet [id number] Yes Take 1 tablet (10 mg total) by mouth at bedtime as needed for sleep. [name], DO Taking Active Self          Fall/Depression Screening:  Fall Risk  10/20/2019 06/13/2019 05/08/2019  Falls in the past year? 0 0 0  Number falls in past yr: 0 0 0  Injury with Fall? 0 0 0   PHQ 2/9 Scores 12/25/2019 11/22/2019 10/23/2019 10/20/2019 06/26/2014  PHQ - 2 Score 6 3 4 3 6   PHQ- 9 Score 13 15 12 14  -    Assessment:  Goals Addressed              This Visit's Progress   .  "I want to get some physical therapy when I am back at home. I want to be able to walk normal without any type of aid to assist me." (pt-stated)   Not on track     [place] (see longitudinal plan of care for additional care plan information)  Current Barriers:  . Care coordination needs related to physical mobility and therapy need . Patient discharged from [facility] SNF after short-term stay for rehab. Her facility SW states she was discharged with home health - unable to determine agency. Patient has not started services with an agency. [name] Update 11/22/2019: Patient stated she has not heard anything from her PCP regarding physical therapy nor has she been contacted.  [name] Update 12/22/19: Patient states she does not need HHPT services at  this time  Clinical Social Work Clinical Goal(s):  [name] Over the next 14 days, patient will work with CM team to address needs related to home physical therapy.  Patient needs help her with her back pain. [name] Update 11/22/2019: Over the next 60 days patient will work with the Managed Medicaid team to address her needs related to home physical therapy. [name] Update 12/22/19:  Patient states she does not need HHPT services right now-she utilizes walker and cane as needed in addition to prescribed medications.   Interventions: . Inter-disciplinary care team collaboration (see longitudinal plan of care) . Patient interviewed and appropriate assessments performed . Referred to [place] colleague for follow-up regarding home care needs.   [name] Update 11/22/2019: LCSW will collaborate with PCP regarding patient's concern and explain that a referral may need to be resubmitted. [name] Update 12/22/19:  Patient states she would like additional DME-walker with seat.  RNCM will contact PCP regarding this possibility. [name] Update 12/25/2019: Patient states she doesn't think she needs physical therapy. She stated she had an examination recently that showed calcification on her knee. She also states she now knows that the trouble she has walking is sometimes due to her gout.   Patient Self Care Activities:  . Patient will work with CM team  over the next 30 days. [name] Update 11/22/2019:  Patient stated she doesn't know the reason she has not been contacted about physical therapy.    Please see past updates related to this goal by clicking on the "Past Updates" button in the selected goal      .  outpatient therapy to help with depression symptoms   On track     [place] (see longitudinal plan of care for additional care plan information)  Current Barriers:  . Community resources access barrier: Patient Lacks knowledge of community resource: mental health outpatient therapy providers. Patient has specific requests for a provider: older, female, have some basis with Christianity, and preferably in-person therapy sessions. Patient was initially reluctant because she did not understand how therapy could help. However, after discussion, she agreed to try therapy. . Patient needs assistance with appointment scheduling and follow up with community [facility], [phone]. . Limited social support . Medical/physical health concerns. 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  Clinical Social Work Clinical Goal(s):  [name] Over the next 60 days, patient will work with SW to address concerns related to depression and scheduling appointment with outpatient therapy provider. . Over the next 30-60 days, patient will work with BSW to address needs related to scheduling appointment with an outpatient mental health therapy provider.  Interventions: . Inter-disciplinary care team collaboration (see longitudinal plan of care) . Patient interviewed and appropriate assessments performed . Referred patient to community resources care guide team for assistance with scheduling appointment for mental health therapy. . Provided mental health counseling with regard to depression, being alone and the importance of a support system. . Discussed plans with patient for  ongoing care management follow up and provided patient with direct contact information for care management team . Collaborated with BSW re: outpatient therapy needs . BSW contacted [facility] for outpatient 12/11/19 at 10:20 am for an assessment. Update 12/20/19 Patient states she missed her appointment due to being sick, however they have called her and left a message to reschedule.  [name] Update 12/22/19:  Patient states she is waiting on a return call from [facility] to reschedule appointment.  Encouraged patient to call and reschedule if she does not hear anything from them-verified phone number with patient. [name] Update 12/25/2019:  Patient has appointment for 12/27/2019 with [facility].  Patient Self Care Activities:  . Patient will collaborate with the Managed Medicaid Care Team . Patient will attend all scheduled appointments. . Licensed Clinical Social Worker will refer patient to BSW for assistance scheduling appointment at [employer] at [phone]. . Licensed Clinical Social Worker will follow-up on December 25, 2019 @ 11:00am. . Update 12/52/7129: Licensed Clinical Social Worker will follow up on January 24, 2020 @ 11:00am.  Please see past updates related to this goal by clicking on the "Past Updates" button in the selected goal        Plan: LCSW will follow up on January 24, 2020 @ 11:00am.  Netta Neat, BSW, MSW, [place]: [phone]

## 2019-12-25 NOTE — Patient Instructions (Signed)
Visit Information Ms. Willadsen, it was a pleasure speaking with you today. If you have any questions, please contact me at [phone].   Ms. Schwimmer was given information about Medicaid Managed Care team care coordination services as a part of their Healthy [facility] Medicaid benefit. [name] verbally consented to engagement with the [facility] Managed Care team.   For questions related to your Healthy [facility] health plan, please call: [phone] or visit the homepage here: [email or web address]  If you would like to schedule transportation through your Healthy [facility] plan, please call the following number at least 2 days in advance of your appointment: [phone]  Goals Addressed              This Visit's Progress   .  "I want to get some physical therapy when I am back at home. I want to be able to walk normal without any type of aid to assist me." (pt-stated)   Not on track     [place] (see longitudinal plan of care for additional care plan information)  Current Barriers:  . Care coordination needs related to physical mobility and therapy need . Patient discharged from [facility] after short-term stay for rehab. Her facility SW states she was discharged with home health - unable to determine agency. Patient has not started services with an agency. [name] Update 11/22/2019: Patient stated she has not heard anything from her PCP regarding physical therapy nor has she been contacted.  [name] Update 12/22/19: Patient states she does not need HHPT services at this time  Clinical Social Work Clinical Goal(s):  [name] Over the next 14 days, patient will work with CM team to address needs related to home physical therapy.  Patient needs help her with her back pain. [name] Update 11/22/2019: Over the next 60 days patient will work with the Managed Medicaid team to address her needs related to home physical  therapy. [name] Update 12/22/19:  Patient states she does not need HHPT services right now-she utilizes walker and cane as needed in addition to prescribed medications.   Interventions: . Inter-disciplinary care team collaboration (see longitudinal plan of care) . Patient interviewed and appropriate assessments performed . Referred to [place] colleague for follow-up regarding home care needs.   [name] Update 11/22/2019: LCSW will collaborate with PCP regarding patient's concern and explain that a referral may need to be resubmitted. [name] Update 12/22/19:  Patient states she would like additional DME-walker with seat.  RNCM will contact PCP regarding this possibility. [name] Update 12/25/2019: Patient states she doesn't think she needs physical therapy. She stated she had an examination recently that showed calcification on her knee. She also states she now knows that the trouble she has walking is sometimes due to her gout.   Patient Self Care Activities:  . Patient will work with CM team over the next 30 days. [name] Update 11/22/2019:  Patient stated she doesn't know the reason she has not been contacted about physical therapy.    Please see past updates related to this goal by clicking on the "Past Updates" button in the selected goal      .  outpatient therapy to help with depression symptoms   On track     [place] (see longitudinal plan of care for additional care plan information)  Current Barriers:  . Community resources access barrier: Patient Lacks knowledge of community resource: mental health outpatient therapy providers. Patient has specific requests  for a provider: older, female, have some basis with Christianity, and preferably in-person therapy sessions. Patient was initially reluctant because she did not understand how therapy could help. However, after discussion, she agreed to try therapy. . Patient needs assistance with appointment scheduling and follow up with  community [facility], [phone]. . Limited social support . Medical/physical health concerns. 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  Clinical Social Work Clinical Goal(s):  [name] Over the next 60 days, patient will work with SW to address concerns related to depression and scheduling appointment with outpatient therapy provider. . Over the next 30-60 days, patient will work with BSW to address needs related to scheduling appointment with an outpatient mental health therapy provider.  Interventions: . Inter-disciplinary care team collaboration (see longitudinal plan of care) . Patient interviewed and appropriate assessments performed . Referred patient to community resources care guide team for assistance with scheduling appointment for mental health therapy. . Provided mental health counseling with regard to depression, being alone and the importance of a support system. . Discussed plans with patient for ongoing care management follow up and provided patient with direct contact information for care management team . Collaborated with BSW re: outpatient therapy needs . BSW contacted [facility] for outpatient 12/11/19 at 10:20 am for an assessment. Update 12/20/19 Patient states she missed her appointment due to being sick, however they have called her and left a message to reschedule.  [name] Update 12/22/19:  Patient states she is waiting on a return call from [facility] to reschedule appointment.  Encouraged patient to call and reschedule if she does not hear anything from them-verified phone number with patient. [name] Update 12/25/2019:  Patient has appointment for 12/27/2019 with [facility].  Patient Self Care Activities:  . Patient will collaborate with the Managed Medicaid Care Team . Patient will attend all scheduled appointments. . Licensed Clinical Social Worker will refer patient  to BSW for assistance scheduling appointment at [employer] at [phone]. . Licensed Clinical Social Worker will follow-up on December 25, 2019 @ 11:00am. . Update 50/51/8335: Licensed Clinical Social Worker will follow up on January 24, 2020 @ 11:00am.  Please see past updates related to this goal by clicking on the "Past Updates" button in the selected goal        Patient verbalizes understanding of instructions provided today.   Licensed Clinical Social Worker will follow up on January 24, 2020 @ 11:00am. The patient has been provided with contact information for the Managed Medicaid care management team and has been advised to call with any health related questions or concerns.   Netta Neat, BSW, MSW, LCSW Social Work Case Freight forwarder - [place]  [facility] [place]: [phone]

## 2020-01-03 DIAGNOSIS — M542 Cervicalgia: Secondary | ICD-10-CM | POA: Diagnosis not present

## 2020-01-03 DIAGNOSIS — E782 Mixed hyperlipidemia: Secondary | ICD-10-CM | POA: Diagnosis not present

## 2020-01-03 DIAGNOSIS — G8929 Other chronic pain: Secondary | ICD-10-CM | POA: Diagnosis not present

## 2020-01-03 DIAGNOSIS — M549 Dorsalgia, unspecified: Secondary | ICD-10-CM | POA: Diagnosis not present

## 2020-01-09 ENCOUNTER — Other Ambulatory Visit: Payer: Self-pay | Admitting: Family Medicine

## 2020-01-09 DIAGNOSIS — E559 Vitamin D deficiency, unspecified: Secondary | ICD-10-CM

## 2020-01-10 ENCOUNTER — Encounter: Payer: Self-pay | Admitting: Family Medicine

## 2020-01-10 NOTE — Telephone Encounter (Signed)
Please see refill request.

## 2020-01-12 ENCOUNTER — Encounter: Payer: Self-pay | Admitting: Family Medicine

## 2020-01-12 DIAGNOSIS — Z87891 Personal history of nicotine dependence: Secondary | ICD-10-CM | POA: Diagnosis not present

## 2020-01-12 DIAGNOSIS — N186 End stage renal disease: Secondary | ICD-10-CM | POA: Diagnosis not present

## 2020-01-12 DIAGNOSIS — Z8673 Personal history of transient ischemic attack (TIA), and cerebral infarction without residual deficits: Secondary | ICD-10-CM | POA: Diagnosis not present

## 2020-01-12 DIAGNOSIS — Z4902 Encounter for fitting and adjustment of peritoneal dialysis catheter: Secondary | ICD-10-CM | POA: Diagnosis not present

## 2020-01-13 ENCOUNTER — Other Ambulatory Visit: Payer: Self-pay | Admitting: Family Medicine

## 2020-01-13 MED ORDER — ALLOPURINOL 100 MG PO TABS
100.0000 mg | ORAL_TABLET | Freq: Every day | ORAL | 3 refills | Status: DC
Start: 2020-01-13 — End: 2021-04-09

## 2020-01-15 ENCOUNTER — Other Ambulatory Visit: Payer: Self-pay | Admitting: Family Medicine

## 2020-01-16 ENCOUNTER — Encounter: Payer: Self-pay | Admitting: Family Medicine

## 2020-01-17 ENCOUNTER — Other Ambulatory Visit: Payer: Self-pay | Admitting: Family Medicine

## 2020-01-19 ENCOUNTER — Ambulatory Visit: Payer: Medicaid Other | Admitting: Family Medicine

## 2020-01-19 ENCOUNTER — Other Ambulatory Visit: Payer: Self-pay

## 2020-01-19 NOTE — Patient Outreach (Signed)
Care Coordination- Social Work  01/19/2020  Tina Watkins [birth year] [id number]  Subjective:    Tina Watkins is an 54 y.o. year old female who is a primary patient of Tina Glatter, FNP.    Ms. Costanza was given information about Medicaid Managed Care team care coordination services today. Tina Watkins agreed to services and verbal consent obtained  Review of patient status, laboratory and other test data was performed as part of evaluation for provision of services.  SDOH:   SDOH Screenings   Alcohol Screen: Low Risk    Last Alcohol Screening Score (AUDIT): 1  Depression (PHQ2-9): Medium Risk   PHQ-2 Score: 13  Financial Resource Strain: High Risk   Difficulty of Paying Living Expenses: Very hard  Food Insecurity: No Food Insecurity   Worried About Charity fundraiser in the Last Year: Never true   Ran Out of Food in the Last Year: Never true  Housing: Low Risk    Last Housing Risk Score: 0  Physical Activity: Insufficiently Active   Days of Exercise per Week: 3 days   Minutes of Exercise per Session: 10 min  Social Connections: Moderately Isolated   Frequency of Communication with Friends and Family: More than three times a week   Frequency of Social Gatherings with Friends and Family: More than three times a week   Attends Religious Services: More than 4 times per year   Active Member of [employer] or Organizations: No   Attends Archivist Meetings: Never   Marital Status: Never married  Stress: Stress Concern Present   Feeling of Stress : To some extent  Tobacco Use: High Risk   Smoking Tobacco Use: Current Every Day Smoker   Smokeless Tobacco Use: Never Used  Transportation Needs: No Transportation Needs   Lack of Transportation (Medical): No   Lack of Transportation (Non-Medical): No     Objective:    Medications:  Medications Reviewed Today    Reviewed by Gayla Medicus, RN (Registered Nurse)  on 12/22/19 at [phone]  Med List Status: <None>  Medication Order Taking? Sig Documenting Provider Last Dose Status Informant  allopurinol (ZYLOPRIM) 100 MG tablet [id number]  Take 1 tablet (100 mg total) by mouth daily. [name], Pratik D, DO  Active Self  aspirin EC 81 MG tablet [id number] Yes Take 81 mg by mouth daily.  [provider] Taking Active Self  calcium acetate, Phos Binder, (PHOSLYRA) 667 MG/5ML SOLN [id number] Yes Take by mouth 3 (three) times daily with meals. [provider] Taking Active   colchicine 0.6 MG tablet [id number] Yes Take 0.5 tablets (0.3 mg total) by mouth daily. [name], MD Taking Active Self           Med Note [name], Benjamine Mola A   Fri Sep 15, 2019  8:21 AM) Just takes when has gout flare  diphenhydrAMINE (BENADRYL) 25 mg capsule [id number] Yes Take 1 capsule (25 mg total) by mouth every 8 (eight) hours as needed.  Patient taking differently: Take 25 mg by mouth every 8 (eight) hours as needed for itching.    Tina Glatter, FNP Taking Active Self  famotidine (PEPCID) 20 MG tablet [id number] Yes Take 1 tablet (20 mg total) by mouth 2 (two) times daily. Tina Glatter, FNP Taking Active Self  gabapentin (NEURONTIN) 100 MG capsule [id number] Yes Take 100 mg by mouth 2 (two) times daily.  [provider] Taking Active Self  latanoprost (XALATAN) 0.005 % ophthalmic solution [id number] Yes Place  1 drop into the left eye at bedtime. [provider] Taking Active Self           Med Note [name] Apr 17, 2019  6:42 PM)    lidocaine (LIDODERM) 5 % [id number] Yes Place 1 patch onto the skin daily as needed (for pain- remove old patch first).  [provider] Taking Active Self  Methoxy PEG-Epoetin Angus Seller) [id number] Yes Mircera [provider] Taking Active   midodrine (PROAMATINE) 10 MG tablet [id number] Yes Take 1.5 tablets (15 mg total) by mouth 3 (three) times daily.  Patient taking differently:  Take 15 mg by mouth 3 (three) times daily. Patient taking 1.0mg    Bensimhon, Shaune Pascal, MD Taking Active   ondansetron (ZOFRAN-ODT) 4 MG disintegrating tablet [id number] Yes Take 1 tablet (4 mg total) by mouth every 8 (eight) hours as needed for nausea or vomiting. Tina Glatter, FNP Taking Active Self  oxyCODONE-acetaminophen (PERCOCET) 10-325 MG tablet [id number] Yes Take 1 tablet by mouth 4 (four) times daily as needed.  [provider] Taking Active   predniSONE (DELTASONE) 5 MG tablet [id number] Yes Take 5 mg by mouth daily with breakfast. [provider] Taking Active   tacrolimus (PROGRAF) 1 MG capsule [id number] Yes Take 2-3 mg by mouth See admin instructions. Take 3 mg by mouth in the morning and 2 mg at bedtime [name], MD Taking Active Self           Med Note [name] Apr 17, 2019  6:42 PM)    Vitamin D, Ergocalciferol, (DRISDOL) 1.25 MG (50000 UNIT) CAPS capsule [id number] Yes Take 50,000 Units by mouth every Sunday.  [provider] Taking Active Self  zolpidem (AMBIEN) 10 MG tablet [id number] Yes Take 1 tablet (10 mg total) by mouth at bedtime as needed for sleep. [name], DO Taking Active Self          Fall/Depression Screening:  Fall Risk  10/20/2019 06/13/2019 05/08/2019  Falls in the past year? 0 0 0  Number falls in past yr: 0 0 0  Injury with Fall? 0 0 0   PHQ 2/9 Scores 12/25/2019 11/22/2019 10/23/2019 10/20/2019 06/26/2014  PHQ - 2 Score 6 3 4 3 6   PHQ- 9 Score 13 15 12 14  -    Assessment: Patient is doing well. Patient stated that she has spoken with [facility] and they are going to send her a list of providers to work with for therapy services.   Plan: BSW will follow up in 30 days Follow-up:  Patient agrees to Care Plan and Follow-up.

## 2020-01-19 NOTE — Patient Instructions (Signed)
Visit Information  Tina Watkins was given information about Medicaid Managed Care team care coordination services as a part of their Healthy [facility] Medicaid benefit. Tina Watkins verbally consented to engagement with the [facility] Managed Care team.   For questions related to your Healthy [facility] health plan, please call: [phone] or visit the homepage here: [email or web address]  If you would like to schedule transportation through your Healthy [facility] plan, please call the following number at least 2 days in advance of your appointment: [phone]    Social Worker will follow up in 30 days.   Tina Watkins

## 2020-01-24 ENCOUNTER — Ambulatory Visit: Payer: Self-pay

## 2020-01-24 ENCOUNTER — Other Ambulatory Visit: Payer: Self-pay | Admitting: Obstetrics and Gynecology

## 2020-01-24 ENCOUNTER — Other Ambulatory Visit: Payer: Self-pay

## 2020-01-24 NOTE — Patient Instructions (Signed)
Hi Ms. [name], thank you for speaking with me today.  Ms. [place] was given information about Medicaid Managed Care team care coordination services as a part of their Healthy [facility] Medicaid benefit. [name] verbally consented to engagement with the [facility] Managed Care team.   For questions related to your Healthy [facility] health plan, please call: [phone] or visit the homepage here: [email or web address]  If you would like to schedule transportation through your Healthy [facility] plan, please call the following number at least 2 days in advance of your appointment: [phone]  Goals Addressed              This Visit's Progress   .  COMPLETED: "I want an aide to come to the home to help me a little bit with some light housework and meal preparation." (pt-stated)        CARE PLAN ENTRY Medicaid Managed Care (see longitudinal plan of care for additional care plan information)  Current Barriers:  . Over the next 60 days, patient will work with CM team to address in-home personal care needs. Patient requests additional assistance with ADL/IADL. Home health PT consult pending.Update 12/22/19-patient states she does not need HHPT services right now.  Clinical Social Work Clinical Goal(s):  [name] Over the next 30 days, patient will work with CM team to address needs related to in-home personal care needs.  Patient discharged from rehab in late August and is currently residing at home alone and receives no services. LCSW will collaborate with PCP to address her needs.  Interventions: . Inter-disciplinary care team collaboration (see longitudinal plan of care) . Patient interviewed and appropriate assessments performed . Provided education to patient/caregiver regarding level of care options. Patient may be eligible for [facility] services; will collaborate with home health PT regarding personal care needs.  Update:  RNCM spoke to patient  regarding PCS and she has not received any updates to date-will follow up with PCP. [name] Update 11/22/2019: LCSW will collaborate with BSW and PCP regarding PCS services. . BSW contacted [employer] at [phone] for [place] stated that PCP has to complete referral form. [name] Update 12/20/19 Patient states that she is now getting PCS services MWF. Update 12/22/19-Patient receiving house keeping and food preparation services-she is very pleased with these services . 01/24/20-[facility] providing services.  Patient Self Care Activities:  . Patient will work with CM team to address personal care needs. . Licensed Clinical Social Worker will collaborate with MM care team and PCP office to address personal care needs.  Please see past updates related to this goal by clicking on the "Past Updates" button in the selected goal      .  Protect My Health        Timeframe:  Long-Range Goal Priority:  High Start Date:       01/24/20                      Expected End Date:      04/23/20                  - schedule recommended health tests   - schedule and keep appointment for annual check-up          Patient Care Plan: General Plan of Care (Adult)    Problem Identified: Health Promotion or Disease Self-Management (General Plan of Care)   Priority: High  Onset Date: 09/13/2019  Note:   CARE PLAN ENTRY Medicaid Managed  Care (see longitudinal plan of care for additional care plan information)  Current Barriers:  . Care coordination needs related to physical mobility and therapy need . Patient discharged from [facility] SNF after short-term stay for rehab. Her facility SW states she was discharged with home health - unable to determine agency. Patient has not started services with an agency. [name] Update 11/22/2019: Patient stated she has not heard anything from her PCP regarding physical therapy nor has she been contacted.  [name] Update 12/22/19: Patient states she does not need  HHPT services at this time  Clinical Social Work Clinical Goal(s):  [name] Over the next 14 days, patient will work with CM team to address needs related to home physical therapy. Patient needs help her with her back pain. [name] Update 11/22/2019: Over the next 60 days patient will work with the Managed Medicaid team to address her needs related to home physical therapy. [name] Update 12/22/19:  Patient states she does not need HHPT services right now-she utilizes walker and cane as needed in addition to prescribed medications.   Interventions: . Inter-disciplinary care team collaboration (see longitudinal plan of care) . Patient interviewed and appropriate assessments performed . Referred to [place] colleague for follow-up regarding home care needs.  [name] Update 11/22/2019: LCSW will collaborate with PCP regarding patient's concern and explain that a referral may need to be resubmitted. [name] Update 12/22/19:  Patient states she would like additional DME-walker with seat.  RNCM will contact PCP regarding this possibility. [name] Update 01/24/20:  patient states she has not heard anything from her PCP, RNCM will follow up today. [name] Update 12/25/2019: Patient states she doesn't think she needs physical therapy. She stated she had an examination recently that showed calcification on her knee. She also states she now knows that the trouble she has walking is sometimes due to her gout.   Patient Self Care Activities:  . Patient will work with CM team over the next 30 days. [name] Update 11/22/2019:  Patient stated she doesn't know the reason she has not been contacted about physical therapy.      Patient verbalizes understanding of instructions provided today.   The Managed Medicaid care management team will reach out to the patient again over the next 30 days.  The patient has been provided with contact information for the Managed Medicaid care management team and has been advised to call with any health related questions or  concerns.   Aida Raider RN, BSN [place]  [facility] Curator - Managed Medicaid High Risk [phone].

## 2020-01-24 NOTE — Patient Outreach (Signed)
Care Coordination - Case Manager  01/24/2020  Tina Watkins 23-Jan-1966 [id number]  Subjective:  Tina Watkins is an 54 y.o. year old female who is a primary patient of [name], FNP.  Tina Watkins was given information about Medicaid Managed Care team care coordination services today. Tina Watkins agreed to services and verbal consent obtained  Review of patient status, laboratory and other test data was performed as part of evaluation for provision of services.  SDOH: SDOH Screenings   Alcohol Screen: Low Risk   . Last Alcohol Screening Score (AUDIT): 1  Depression (PHQ2-9): Medium Risk  . PHQ-2 Score: 13  Financial Resource Strain: High Risk  . Difficulty of Paying Living Expenses: Very hard  Food Insecurity: No Food Insecurity  . Worried About Charity fundraiser in the Last Year: Never true  . Ran Out of Food in the Last Year: Never true  Housing: Low Risk   . Last Housing Risk Score: 0  Physical Activity: Insufficiently Active  . Days of Exercise per Week: 3 days  . Minutes of Exercise per Session: 10 min  Social Connections: Moderately Isolated  . Frequency of Communication with Friends and Family: More than three times a week  . Frequency of Social Gatherings with Friends and Family: More than three times a week  . Attends Religious Services: More than 4 times per year  . Active Member of Clubs or Organizations: No  . Attends Archivist Meetings: Never  . Marital Status: Never married  Stress: Stress Concern Present  . Feeling of Stress : To some extent  Tobacco Use: High Risk  . Smoking Tobacco Use: Current Every Day Smoker  . Smokeless Tobacco Use: Never Used  Transportation Needs: No Transportation Needs  . Lack of Transportation (Medical): No  . Lack of Transportation (Non-Medical): No     Objective:    Allergies  Allergen Reactions  . Levofloxacin Itching and Rash  . Tape Other (See Comments)     "Certain surgical tapes peel off my skin"    Medications:    Medications Reviewed Today    Reviewed by Gayla Medicus, RN (Registered Nurse) on 01/24/20 at [phone]  Med List Status: <None>  Medication Order Taking? Sig Documenting Provider Last Dose Status Informant  allopurinol (ZYLOPRIM) 100 MG tablet [id number] Yes Take 1 tablet (100 mg total) by mouth daily. [name], FNP Taking Active   aspirin EC 81 MG tablet [id number] Yes Take 81 mg by mouth daily.  [provider] Taking Active Self  calcium acetate, Phos Binder, (PHOSLYRA) 667 MG/5ML SOLN [id number] Yes Take by mouth 3 (three) times daily with meals. [provider] Taking Active   colchicine 0.6 MG tablet [id number] Yes Take 0.5 tablets (0.3 mg total) by mouth daily. [name], MD Taking Active Self           Med Note [name], Benjamine Mola A   Fri Sep 15, 2019  8:21 AM) Just takes when has gout flare  diphenhydrAMINE (BENADRYL) 25 mg capsule [id number] Yes Take 1 capsule (25 mg total) by mouth every 8 (eight) hours as needed.  Patient taking differently: Take 25 mg by mouth every 8 (eight) hours as needed for itching.   [name], FNP Taking Active Self  famotidine (PEPCID) 20 MG tablet [id number] Yes Take 1 tablet (20 mg total) by mouth 2 (two) times daily. [name], FNP Taking Active Self  gabapentin (NEURONTIN) 100 MG capsule [id number] Yes  Take 100 mg by mouth 2 (two) times daily.  [provider] Taking Active Self  latanoprost (XALATAN) 0.005 % ophthalmic solution [id number] Yes Place 1 drop into the left eye at bedtime. [provider] Taking Active Self           Med Note [name] Apr 17, 2019  6:42 PM)    lidocaine (LIDODERM) 5 % [id number] Yes Place 1 patch onto the skin daily as needed (for pain- remove old patch first).  [provider] Taking Active Self  Methoxy PEG-Epoetin Angus Seller) [id number] Yes Mircera [provider] Taking  Active   midodrine (PROAMATINE) 10 MG tablet [id number] Yes Take 1.5 tablets (15 mg total) by mouth 3 (three) times daily.  Patient taking differently: Take 15 mg by mouth 3 (three) times daily. Patient taking 1.0mg    Bensimhon, Shaune Pascal, MD Taking Active   ondansetron (ZOFRAN-ODT) 4 MG disintegrating tablet [id number] Yes Take 1 tablet (4 mg total) by mouth every 8 (eight) hours as needed for nausea or vomiting. [name], FNP Taking Active Self  oxyCODONE-acetaminophen (PERCOCET) 10-325 MG tablet [id number] Yes Take 1 tablet by mouth 4 (four) times daily as needed.  [provider] Taking Active   predniSONE (DELTASONE) 5 MG tablet [id number] Yes Take 5 mg by mouth daily with breakfast. [provider] Taking Active   tacrolimus (PROGRAF) 1 MG capsule [id number] Yes Take 2-3 mg by mouth See admin instructions. Take 3 mg by mouth in the morning and 2 mg at bedtime [name], MD Taking Active Self           Med Note [name] Apr 17, 2019  6:42 PM)    Vitamin D, Ergocalciferol, (DRISDOL) 1.25 MG (50000 UNIT) CAPS capsule [id number] Yes TAKE 1 CAPSULE (50,000 UNITS TOTAL) BY MOUTH EVERY 7 (SEVEN) DAYS. [name], FNP Taking Active   zolpidem (AMBIEN) 10 MG tablet [id number] Yes Take 1 tablet (10 mg total) by mouth at bedtime as needed for sleep. [name], DO Taking Active Self          Assessment:   Goals Addressed              This Visit's Progress   .  COMPLETED: "I want an aide to come to the home to help me a little bit with some light housework and meal preparation." (pt-stated)        CARE PLAN ENTRY Medicaid Managed Care (see longitudinal plan of care for additional care plan information)  Current Barriers:  . Over the next 60 days, patient will work with CM team to address in-home personal care needs. Patient requests additional assistance with ADL/IADL. Home health PT consult pending.Update 12/22/19-patient states she  does not need HHPT services right now.  Clinical Social Work Clinical Goal(s):  [name] Over the next 30 days, patient will work with CM team to address needs related to in-home personal care needs.  Patient discharged from rehab in late August and is currently residing at home alone and receives no services. LCSW will collaborate with PCP to address her needs.  Interventions: . Inter-disciplinary care team collaboration (see longitudinal plan of care) . Patient interviewed and appropriate assessments performed . Provided education to patient/caregiver regarding level of care options. Patient may be eligible for [facility] services; will collaborate with home health PT regarding personal care needs.  Update:  RNCM spoke to patient regarding PCS and she has  not received any updates to date-will follow up with PCP. [name] Update 11/22/2019: LCSW will collaborate with BSW and PCP regarding PCS services. . BSW contacted [employer] at [phone] for [place] stated that PCP has to complete referral form. [name] Update 12/20/19 Patient states that she is now getting PCS services MWF. Update 12/22/19-Patient receiving house keeping and food preparation services-she is very pleased with these services . 01/24/20-[facility] providing services.  Patient Self Care Activities:  . Patient will work with CM team to address personal care needs. . Licensed Clinical Social Worker will collaborate with MM care team and PCP office to address personal care needs.  Please see past updates related to this goal by clicking on the "Past Updates" button in the selected goal      .  Protect My Health        Timeframe:  Long-Range Goal Priority:  High Start Date:       01/24/20                      Expected End Date:      04/23/20                  - schedule recommended health tests   - schedule and keep appointment for annual check-up      Plan: RNCM will follow up with patient within 30 days. Follow-up:   Patient agrees to Care Plan and Follow-up.

## 2020-01-25 ENCOUNTER — Ambulatory Visit: Payer: Medicaid Other | Admitting: Cardiovascular Disease

## 2020-01-29 ENCOUNTER — Other Ambulatory Visit: Payer: Self-pay

## 2020-01-29 NOTE — Patient Instructions (Signed)
Visit Information Tina Watkins, it was a pleasure speaking with you today. Please remember to contact your Medicaid caseworker at [place] and also your PCP regarding the status of your aide's services being terminated.   Tina Watkins was given information about Medicaid Managed Care team care coordination services as a part of their Healthy [facility] Medicaid benefit. Tina Watkins verbally consented to engagement with the [facility] Managed Care team.   For questions related to your Healthy [facility] health plan, please call: [phone] or visit the homepage here: [email or web address]  If you would like to schedule transportation through your Healthy [facility] plan, please call the following number at least 2 days in advance of your appointment: [phone]  Goals Addressed              This Visit's Progress   .  "I want an aide to come to the home to help me a little bit with some light housework and meal preparation." (pt-stated)   Not on track     [place] (see longitudinal plan of care for additional care plan information)  Current Barriers:  . Over the next 60 days, patient will work with CM team to address in-home personal care needs. Patient requests additional assistance with ADL/IADL. Home health PT consult pending.Update 12/22/19-patient states she does not need HHPT services right now. [name] Update 01/29/2020:  Patient stated she received a call on 01/25/2020 from home health agency that Medicaid will no longer pay for an aide to work with her. Patient stated she does not know the reason this happened, but she will contact DSS Medicaid caseworker and her PCP for more information.  Clinical Social Work Clinical Goal(s):  [name] Over the next 30 days, patient will work with CM team to address needs related to in-home personal care needs.  Patient discharged from rehab in late August and is currently residing at home  alone and receives no services. LCSW will collaborate with PCP to address her needs. [name] Update 01/29/2020: LCSW will collaborate with PCP for additional information regarding termination of the aide's services.  Interventions: . Inter-disciplinary care team collaboration (see longitudinal plan of care) . Patient interviewed and appropriate assessments performed . Provided education to patient/caregiver regarding level of care options. Patient may be eligible for [facility] services; will collaborate with home health PT regarding personal care needs.  Update:  RNCM spoke to patient regarding PCS and she has not received any updates to date-will follow up with PCP. [name] Update 11/22/2019: LCSW will collaborate with BSW and PCP regarding PCS services. . BSW contacted [employer] at [phone] for [place] stated that PCP has to complete referral form. [name] Update 12/20/19 Patient states that she is now getting PCS services MWF. Update 12/22/19-Patient receiving house keeping and food preparation services-she is very pleased with these services . 01/24/20-[facility] providing services. [name] Update 01/29/2020: Patient stated that on 01/25/2020, she received a call from the home health agency that Medicaid would no longer pay for an aide and the services were terminated. LCSW will collaborate with PCP regarding termination. Patient stated she will contact her DSS Medicaid worker and also her PCP for additional information and assistance with getting her aide to start back working with her.  Patient Self Care Activities:  . Patient will work with CM team to address personal care needs. . Licensed Clinical Social Worker will collaborate with MM care team and PCP office to address personal care needs. [name] Update  01/29/2020: LCSW will follow up on 02/16/2020 @ 11:00am.  Please see past updates related to this goal by clicking on the "Past Updates" button in the selected goal      .  COMPLETED: "I  want to get some physical therapy when I am back at home. I want to be able to walk normal without any type of aid to assist me." (pt-stated)   Not on track     [place] (see longitudinal plan of care for additional care plan information)  Current Barriers:  . Care coordination needs related to physical mobility and therapy need . Patient discharged from [facility] SN[facility] after short-term stay for rehab. Her facility SW states she was discharged with home health - unable to determine agency. Patient has not started services with an agency. [name] Update 11/22/2019: Patient stated she has not heard anything from her PCP regarding physical therapy nor has she been contacted.  [name] Update 12/22/19: Patient states she does not need HHPT services at this time  Clinical Social Work Clinical Goal(s):  [name] Over the next 14 days, patient will work with CM team to address needs related to home physical therapy.  Patient needs help her with her back pain. [name] Update 11/22/2019: Over the next 60 days patient will work with the Managed Medicaid team to address her needs related to home physical therapy. [name] Update 12/22/19:  Patient states she does not need HHPT services right now-she utilizes walker and cane as needed in addition to prescribed medications.   Interventions: . Inter-disciplinary care team collaboration (see longitudinal plan of care) . Patient interviewed and appropriate assessments performed . Referred to [place] colleague for follow-up regarding home care needs.   [name] Update 11/22/2019: LCSW will collaborate with PCP regarding patient's concern and explain that a referral may need to be resubmitted. [name] Update 12/22/19:  Patient states she would like additional DME-walker with seat.  RNCM will contact PCP regarding this possibility. [name] Update 12/25/2019: Patient states she doesn't think she needs physical therapy. She stated she had an examination recently that showed calcification on  her knee. She also states she now knows that the trouble she has walking is sometimes due to her gout.   Patient Self Care Activities:  . Patient will work with CM team over the next 30 days. [name] Update 11/22/2019:  Patient stated she doesn't know the reason she has not been contacted about physical therapy.    Please see past updates related to this goal by clicking on the "Past Updates" button in the selected goal      .  outpatient therapy to help with depression symptoms   Not on track     [place] (see longitudinal plan of care for additional care plan information)  Current Barriers:  . Community resources access barrier: Patient Lacks knowledge of community resource: mental health outpatient therapy providers. Patient has specific requests for a provider: older, female, have some basis with Christianity, and preferably in-person therapy sessions. Patient was initially reluctant because she did not understand how therapy could help. However, after discussion, she agreed to try therapy. . Patient needs assistance with appointment scheduling and follow up with community [facility], [phone]. Update 01/29/2020: Patient stated she has not received communication from [facility] regarding rescheduling an appointment.  . Limited social support . Medical/physical health concerns. 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  Clinical  Social Work Clinical Goal(s):  [name] Over the next 60 days, patient will work with SW to address concerns related to depression and scheduling appointment with outpatient therapy provider. . Over the next 30-60 days, patient will work with BSW to address needs related to scheduling appointment with an outpatient mental health therapy provider.  Interventions: . Inter-disciplinary care team collaboration (see longitudinal plan of care) . Patient interviewed and appropriate  assessments performed . Referred patient to community resources care guide team for assistance with scheduling appointment for mental health therapy. . Provided mental health counseling with regard to depression, being alone and the importance of a support system. . Discussed plans with patient for ongoing care management follow up and provided patient with direct contact information for care management team . Collaborated with BSW re: outpatient therapy needs . BSW contacted [facility] for outpatient 12/11/19 at 10:20 am for an assessment. Update 12/20/19 Patient states she missed her appointment due to being sick, however they have called her and left a message to reschedule.  [name] Update 12/22/19:  Patient states she is waiting on a return call from [facility] to reschedule appointment.  Encouraged patient to call and reschedule if she does not hear anything from them-verified phone number with patient. [name] Update 12/25/2019:  Patient has appointment for 12/27/2019 with [facility]. [name] Update 01/29/2020: LCSW will collaborate with BSW regarding status of appointment with [facility]. If office is unable to schedule patient, then another agency will be contacted.  Patient Self Care Activities:  . Patient will collaborate with the Managed Medicaid Care Team . Patient will attend all scheduled appointments. . Licensed Clinical Social Worker will refer patient to BSW for assistance scheduling appointment at [employer] at [phone]. . Licensed Clinical Social Worker will follow-up on December 25, 2019 @ 11:00am. . Update 15/40/0867: Licensed Clinical Social Worker will follow up on January 24, 2020 @ 11:00am. . Update 01/29/2020: LCSW will follow up on February 16, 2020 @ 11:00am.  Please see past updates related to this goal by clicking on the "Past Updates" button in the selected goal       Patient Care Plan: General Plan of Care  (Adult)    Problem Identified: Health Promotion or Disease Self-Management (General Plan of Care)   Priority: High  Onset Date: 09/13/2019  Note:   CARE PLAN ENTRY Medicaid Managed Care (see longitudinal plan of care for additional care plan information)  Current Barriers:  . Care coordination needs related to physical mobility and therapy need . Patient discharged from [facility] SN[facility] after short-term stay for rehab. Her facility SW states she was discharged with home health - unable to determine agency. Patient has not started services with an agency. [name] Update 11/22/2019: Patient stated she has not heard anything from her PCP regarding physical therapy nor has she been contacted.  [name] Update 12/22/19: Patient states she does not need HHPT services at this time  Clinical Social Work Clinical Goal(s):  [name] Over the next 14 days, patient will work with CM team to address needs related to home physical therapy. Patient needs help her with her back pain. [name] Update 11/22/2019: Over the next 60 days patient will work with the Managed Medicaid team to address her needs related to home physical therapy. [name] Update 12/22/19:  Patient states she does not need HHPT services right now-she utilizes walker and cane as needed in addition to prescribed medications.   Interventions: .  Inter-disciplinary care team collaboration (see longitudinal plan of care) . Patient interviewed and appropriate assessments performed . Referred to [place] colleague for follow-up regarding home care needs.  [name] Update 11/22/2019: LCSW will collaborate with PCP regarding patient's concern and explain that a referral may need to be resubmitted. [name] Update 12/22/19:  Patient states she would like additional DME-walker with seat.  RNCM will contact PCP regarding this possibility. [name] Update 01/24/20:  patient states she has not heard anything from her PCP, RNCM will follow up today. [name] Update 12/25/2019: Patient states she doesn't think she  needs physical therapy. She stated she had an examination recently that showed calcification on her knee. She also states she now knows that the trouble she has walking is sometimes due to her gout.   Patient Self Care Activities:  . Patient will work with CM team over the next 30 days. [name] Update 11/22/2019:  Patient stated she doesn't know the reason she has not been contacted about physical therapy.       The patient verbalized understanding of instructions provided today and agreed to receive a mailed copy of patient instruction and/or educational materials.  Licensed Clinical Social Worker will follow up on February 16, 2020 @ 11:00am. The Managed Medicaid care management team will reach out to the patient again over the next 7-14 days.  The patient has been provided with contact information for the Managed Medicaid care management team and has been advised to call with any health related questions or concerns.   Netta Neat, LCSW Netta Neat, BSW, MSW, LCSW Social Work Case Freight forwarder - [place]  [employer] [place]: [phone]

## 2020-01-29 NOTE — Patient Outreach (Signed)
Care Coordination- Social Work  01/29/2020  Tina Watkins Oct 13, 1965 [id number]  Subjective:    Tina Watkins is an 54 y.o. year old female who is a primary patient of Tina Glatter, FNP.    Tina Watkins was given information about Medicaid Managed Care team care coordination services today. Tina Watkins agreed to services and verbal consent obtained  Review of patient status, laboratory and other test data was performed as part of evaluation for provision of services.  SDOH:   SDOH Screenings   Alcohol Screen: Low Risk   . Last Alcohol Screening Score (AUDIT): 1  Depression (PHQ2-9): Medium Risk  . PHQ-2 Score: 14  Financial Resource Strain: Low Risk   . Difficulty of Paying Living Expenses: Not hard at all  Food Insecurity: No Food Insecurity  . Worried About Charity fundraiser in the Last Year: Never true  . Ran Out of Food in the Last Year: Never true  Housing: Low Risk   . Last Housing Risk Score: 0  Physical Activity: Insufficiently Active  . Days of Exercise per Week: 3 days  . Minutes of Exercise per Session: 10 min  Social Connections: Moderately Isolated  . Frequency of Communication with Friends and Family: More than three times a week  . Frequency of Social Gatherings with Friends and Family: More than three times a week  . Attends Religious Services: More than 4 times per year  . Active Member of Clubs or Organizations: No  . Attends Archivist Meetings: Never  . Marital Status: Never married  Stress: Stress Concern Present  . Feeling of Stress : To some extent  Tobacco Use: High Risk  . Smoking Tobacco Use: Current Every Day Smoker  . Smokeless Tobacco Use: Never Used  Transportation Needs: No Transportation Needs  . Lack of Transportation (Medical): No  . Lack of Transportation (Non-Medical): No   SDOH Interventions   Flowsheet Row Most Recent Value  SDOH Interventions   Food Insecurity Interventions  Intervention Not Indicated  Financial Strain Interventions Intervention Not Indicated  Housing Interventions Intervention Not Indicated  Physical Activity Interventions Intervention Not Indicated  Stress Interventions Intervention Not Indicated  Transportation Interventions Intervention Not Indicated  Depression Interventions/Treatment  Counseling, Referral to Psychiatry  [Patient has been referred for therapy and is currently waiting to be scheduled with the agency.]      Objective:    Medications:  Medications Reviewed Today    Reviewed by Gayla Medicus, RN (Registered Nurse) on 01/24/20 at [id number]  Med List Status: <None>  Medication Order Taking? Sig Documenting Provider Last Dose Status Informant  allopurinol (ZYLOPRIM) 100 MG tablet [id number] Yes Take 1 tablet (100 mg total) by mouth daily. Tina Glatter, FNP Taking Active   aspirin EC 81 MG tablet [id number] Yes Take 81 mg by mouth daily.  [provider] Taking Active Self  calcium acetate, Phos Binder, (PHOSLYRA) 667 MG/5ML SOLN [id number] Yes Take by mouth 3 (three) times daily with meals. [provider] Taking Active   colchicine 0.6 MG tablet [id number] Yes Take 0.5 tablets (0.3 mg total) by mouth daily. [name], MD Taking Active Self           Med Note [name], Benjamine Mola A   Fri Sep 15, 2019  8:21 AM) Just takes when has gout flare  diphenhydrAMINE (BENADRYL) 25 mg capsule [id number] Yes Take 1 capsule (25 mg total) by mouth every 8 (eight) hours as needed.  Patient taking differently: Take  25 mg by mouth every 8 (eight) hours as needed for itching.   Tina Glatter, FNP Taking Active Self  famotidine (PEPCID) 20 MG tablet [id number] Yes Take 1 tablet (20 mg total) by mouth 2 (two) times daily. Tina Glatter, FNP Taking Active Self  gabapentin (NEURONTIN) 100 MG capsule [id number] Yes Take 100 mg by mouth 2 (two) times daily.  [provider] Taking Active Self  latanoprost (XALATAN)  0.005 % ophthalmic solution [id number] Yes Place 1 drop into the left eye at bedtime. [provider] Taking Active Self           Med Note [name] Apr 17, 2019  6:42 PM)    lidocaine (LIDODERM) 5 % [id number] Yes Place 1 patch onto the skin daily as needed (for pain- remove old patch first).  [provider] Taking Active Self  Methoxy PEG-Epoetin Angus Seller) [id number] Yes Mircera [provider] Taking Active   midodrine (PROAMATINE) 10 MG tablet [id number] Yes Take 1.5 tablets (15 mg total) by mouth 3 (three) times daily.  Patient taking differently: Take 15 mg by mouth 3 (three) times daily. Patient taking 1.0mg    Bensimhon, Shaune Pascal, MD Taking Active   ondansetron (ZOFRAN-ODT) 4 MG disintegrating tablet [id number] Yes Take 1 tablet (4 mg total) by mouth every 8 (eight) hours as needed for nausea or vomiting. Tina Glatter, FNP Taking Active Self  oxyCODONE-acetaminophen (PERCOCET) 10-325 MG tablet [id number] Yes Take 1 tablet by mouth 4 (four) times daily as needed.  [provider] Taking Active   predniSONE (DELTASONE) 5 MG tablet [id number] Yes Take 5 mg by mouth daily with breakfast. [provider] Taking Active   tacrolimus (PROGRAF) 1 MG capsule [id number] Yes Take 2-3 mg by mouth See admin instructions. Take 3 mg by mouth in the morning and 2 mg at bedtime [name], MD Taking Active Self           Med Note [name] Apr 17, 2019  6:42 PM)    Vitamin D, Ergocalciferol, (DRISDOL) 1.25 MG (50000 UNIT) CAPS capsule [id number] Yes TAKE 1 CAPSULE (50,000 UNITS TOTAL) BY MOUTH EVERY 7 (SEVEN) DAYS. Tina Glatter, FNP Taking Active   zolpidem (AMBIEN) 10 MG tablet [id number] Yes Take 1 tablet (10 mg total) by mouth at bedtime as needed for sleep. [name], DO Taking Active Self          Fall/Depression Screening:  Fall Risk  10/20/2019 06/13/2019 05/08/2019  Falls in the past year? 0 0 0  Number  falls in past yr: 0 0 0  Injury with Fall? 0 0 0   PHQ 2/9 Scores 01/29/2020 12/25/2019 11/22/2019 10/23/2019 10/20/2019 06/26/2014  PHQ - 2 Score 3 6 3 4 3 6   PHQ- 9 Score 14 13 15 12 14  -    Assessment:  Goals Addressed              This Visit's Progress   .  "I want an aide to come to the home to help me a little bit with some light housework and meal preparation." (pt-stated)   Not on track     [place] (see longitudinal plan of care for additional care plan information)  Current Barriers:  . Over the next 60 days, patient will work with CM team to address in-home personal care needs. Patient requests additional assistance with ADL/IADL. Home health PT consult  pending.Update 12/22/19-patient states she does not need HHPT services right now. [name] Update 01/29/2020:  Patient stated she received a call on 01/25/2020 from home health agency that Medicaid will no longer pay for an aide to work with her. Patient stated she does not know the reason this happened, but she will contact DSS Medicaid caseworker and her PCP for more information.  Clinical Social Work Clinical Goal(s):  [name] Over the next 30 days, patient will work with CM team to address needs related to in-home personal care needs.  Patient discharged from rehab in late August and is currently residing at home alone and receives no services. LCSW will collaborate with PCP to address her needs. [name] Update 01/29/2020: LCSW will collaborate with PCP for additional information regarding termination of the aide's services.  Interventions: . Inter-disciplinary care team collaboration (see longitudinal plan of care) . Patient interviewed and appropriate assessments performed . Provided education to patient/caregiver regarding level of care options. Patient may be eligible for [facility] services; will collaborate with home health PT regarding personal care needs.  Update:  RNCM spoke to patient regarding PCS and she has  not received any updates to date-will follow up with PCP. [name] Update 11/22/2019: LCSW will collaborate with BSW and PCP regarding PCS services. . BSW contacted [employer] at [phone] for [place] stated that PCP has to complete referral form. [name] Update 12/20/19 Patient states that she is now getting PCS services MWF. Update 12/22/19-Patient receiving house keeping and food preparation services-she is very pleased with these services . 01/24/20-[facility] providing services. [name] Update 01/29/2020: Patient stated that on 01/25/2020, she received a call from the home health agency that Medicaid would no longer pay for an aide and the services were terminated. LCSW will collaborate with PCP regarding termination. Patient stated she will contact her DSS Medicaid worker and also her PCP for additional information and assistance with getting her aide to start back working with her.  Patient Self Care Activities:  . Patient will work with CM team to address personal care needs. . Licensed Clinical Social Worker will collaborate with MM care team and PCP office to address personal care needs. [name] Update 01/29/2020: LCSW will follow up on 02/16/2020 @ 11:00am.  Please see past updates related to this goal by clicking on the "Past Updates" button in the selected goal      .  COMPLETED: "I want to get some physical therapy when I am back at home. I want to be able to walk normal without any type of aid to assist me." (pt-stated)   Not on track     [place] (see longitudinal plan of care for additional care plan information)  Current Barriers:  . Care coordination needs related to physical mobility and therapy need . Patient discharged from [facility] after short-term stay for rehab. Her facility SW states she was discharged with home health - unable to determine agency. Patient has not started services with an agency. [name] Update 11/22/2019: Patient  stated she has not heard anything from her PCP regarding physical therapy nor has she been contacted.  [name] Update 12/22/19: Patient states she does not need HHPT services at this time  Clinical Social Work Clinical Goal(s):  [name] Over the next 14 days, patient will work with CM team to address needs related to home physical therapy.  Patient needs help her with her back pain. [name] Update 11/22/2019: Over the next 60 days patient  will work with the Managed Medicaid team to address her needs related to home physical therapy. [name] Update 12/22/19:  Patient states she does not need HHPT services right now-she utilizes walker and cane as needed in addition to prescribed medications.   Interventions: . Inter-disciplinary care team collaboration (see longitudinal plan of care) . Patient interviewed and appropriate assessments performed . Referred to [place] colleague for follow-up regarding home care needs.   [name] Update 11/22/2019: LCSW will collaborate with PCP regarding patient's concern and explain that a referral may need to be resubmitted. [name] Update 12/22/19:  Patient states she would like additional DME-walker with seat.  RNCM will contact PCP regarding this possibility. [name] Update 12/25/2019: Patient states she doesn't think she needs physical therapy. She stated she had an examination recently that showed calcification on her knee. She also states she now knows that the trouble she has walking is sometimes due to her gout.   Patient Self Care Activities:  . Patient will work with CM team over the next 30 days. [name] Update 11/22/2019:  Patient stated she doesn't know the reason she has not been contacted about physical therapy.    Please see past updates related to this goal by clicking on the "Past Updates" button in the selected goal      .  outpatient therapy to help with depression symptoms   Not on track     [place] (see longitudinal plan of care for additional care plan  information)  Current Barriers:  . Community resources access barrier: Patient Lacks knowledge of community resource: mental health outpatient therapy providers. Patient has specific requests for a provider: older, female, have some basis with Christianity, and preferably in-person therapy sessions. Patient was initially reluctant because she did not understand how therapy could help. However, after discussion, she agreed to try therapy. . Patient needs assistance with appointment scheduling and follow up with community [facility], [phone]. Update 01/29/2020: Patient stated she has not received communication from [facility] regarding rescheduling an appointment.  . Limited social support . Medical/physical health concerns. 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  Clinical Social Work Clinical Goal(s):  [name] Over the next 60 days, patient will work with SW to address concerns related to depression and scheduling appointment with outpatient therapy provider. . Over the next 30-60 days, patient will work with BSW to address needs related to scheduling appointment with an outpatient mental health therapy provider.  Interventions: . Inter-disciplinary care team collaboration (see longitudinal plan of care) . Patient interviewed and appropriate assessments performed . Referred patient to community resources care guide team for assistance with scheduling appointment for mental health therapy. . Provided mental health counseling with regard to depression, being alone and the importance of a support system. . Discussed plans with patient for ongoing care management follow up and provided patient with direct contact information for care management team . Collaborated with BSW re: outpatient therapy needs . BSW contacted [facility] for outpatient 12/11/19 at 10:20 am for an assessment. Update 12/20/19 Patient states she  missed her appointment due to being sick, however they have called her and left a message to reschedule.  [name] Update 12/22/19:  Patient states she is waiting on a return call from [facility] to reschedule appointment.  Encouraged patient to call and reschedule if she does not hear anything from them-verified phone number with patient. [name] Update 12/25/2019:  Patient has appointment for 12/27/2019 with [facility]. [name] Update 01/29/2020: LCSW will collaborate with BSW regarding status of appointment with [facility]. If office is unable to schedule patient, then another agency will be contacted.  Patient Self Care Activities:  . Patient will collaborate with the Managed Medicaid Care Team . Patient will attend all scheduled appointments. . Licensed Clinical Social Worker will refer patient to BSW for assistance scheduling appointment at [employer] [facility] at [phone]. . Licensed Clinical Social Worker will follow-up on December 25, 2019 @ 11:00am. . Update 88/89/1694: Licensed Clinical Social Worker will follow up on January 24, 2020 @ 11:00am. . Update 01/29/2020: LCSW will follow up on February 16, 2020 @ 11:00am.  Please see past updates related to this goal by clicking on the "Past Updates" button in the selected goal        Plan: LCSW will collaborate with BSW regarding status of therapy appointment. LCSW will follow up on February 16, 2020 @ 11:00am.  Follow-up:  Patient agrees to Care Plan and Follow-up.   Netta Neat, BSW, MSW, LCSW Social Work Case Freight forwarder - [place]  [facility] [place]: [phone]

## 2020-02-04 ENCOUNTER — Encounter (HOSPITAL_COMMUNITY): Payer: Self-pay | Admitting: Emergency Medicine

## 2020-02-04 ENCOUNTER — Emergency Department (HOSPITAL_COMMUNITY)
Admission: EM | Admit: 2020-02-04 | Discharge: 2020-02-04 | Disposition: A | Discharge status: Home / Self Care | Payer: Medicare Other | Attending: Emergency Medicine | Admitting: Emergency Medicine

## 2020-02-04 DIAGNOSIS — R000 Tachycardia, unspecified: Secondary | ICD-10-CM | POA: Insufficient documentation

## 2020-02-04 DIAGNOSIS — Z5321 Procedure and treatment not carried out due to patient leaving prior to being seen by health care provider: Secondary | ICD-10-CM | POA: Diagnosis not present

## 2020-02-04 LAB — BASIC METABOLIC PANEL
Anion gap: 15 (ref 5–15)
BUN: 17 mg/dL (ref 6–20)
CO2: 26 mmol/L (ref 22–32)
Calcium: 9.1 mg/dL (ref 8.9–10.3)
Chloride: 95 mmol/L — ABNORMAL LOW (ref 98–111)
Creatinine, Ser: 3.31 mg/dL — ABNORMAL HIGH (ref 0.44–1.00)
GFR, Estimated: 16 mL/min — ABNORMAL LOW (ref >60)
Glucose, Bld: 81 mg/dL (ref 70–99)
Potassium: 3.5 mmol/L (ref 3.5–5.1)
Sodium: 136 mmol/L (ref 135–145)

## 2020-02-04 LAB — CBC
HCT: 41.7 % (ref 36.0–46.0)
Hemoglobin: 12.9 g/dL (ref 12.0–15.0)
MCH: 26.9 pg (ref 26.0–34.0)
MCHC: 30.9 g/dL (ref 30.0–36.0)
MCV: 87.1 fL (ref 80.0–100.0)
Platelets: 244 10*3/uL (ref 150–400)
RBC: 4.79 MIL/uL (ref 3.87–5.11)
RDW: 22.7 % — ABNORMAL HIGH (ref 11.5–15.5)
WBC: 8 10*3/uL (ref 4.0–10.5)
nRBC: 0 % (ref 0.0–0.2)

## 2020-02-04 NOTE — ED Notes (Signed)
Patient reports that her heart rate is down and feeling better, encouraged to stay but wants to go home

## 2020-02-04 NOTE — ED Triage Notes (Addendum)
Patient BIB [facility] for tachycardia and emesis during dialysis, completed approximately 3 hours 20 minutes of four hour treatment. Patient states she feels much better after arriving to ED.  Patient has no complaints at this time.

## 2020-02-05 ENCOUNTER — Encounter: Payer: Self-pay | Admitting: Family Medicine

## 2020-02-06 ENCOUNTER — Telehealth: Payer: Self-pay | Admitting: *Deleted

## 2020-02-06 NOTE — Telephone Encounter (Signed)
Tina Watkins presented to the ED and left before being seen by the provider on 02/04/2020. The patient has been enrolled in an automated general discharge outreach program and 2 attempts to contact the patient will be made to follow up on their ED visit and subsequent needs. The care management team is available to provide assistance to this patient at any time.   Desmond Dike, CMA Deborra Medina) /[facility]  [place] Specialist

## 2020-02-09 ENCOUNTER — Ambulatory Visit (INDEPENDENT_AMBULATORY_CARE_PROVIDER_SITE_OTHER): Payer: Medicare Other | Admitting: Family Medicine

## 2020-02-09 ENCOUNTER — Other Ambulatory Visit: Payer: Self-pay

## 2020-02-09 DIAGNOSIS — Z992 Dependence on renal dialysis: Secondary | ICD-10-CM | POA: Diagnosis not present

## 2020-02-09 DIAGNOSIS — I509 Heart failure, unspecified: Secondary | ICD-10-CM | POA: Diagnosis not present

## 2020-02-09 DIAGNOSIS — R110 Nausea: Secondary | ICD-10-CM

## 2020-02-09 DIAGNOSIS — M25561 Pain in right knee: Secondary | ICD-10-CM

## 2020-02-09 DIAGNOSIS — N186 End stage renal disease: Secondary | ICD-10-CM | POA: Diagnosis not present

## 2020-02-09 DIAGNOSIS — F419 Anxiety disorder, unspecified: Secondary | ICD-10-CM

## 2020-02-09 DIAGNOSIS — R531 Weakness: Secondary | ICD-10-CM

## 2020-02-09 DIAGNOSIS — M25562 Pain in left knee: Secondary | ICD-10-CM

## 2020-02-09 DIAGNOSIS — Z09 Encounter for follow-up examination after completed treatment for conditions other than malignant neoplasm: Secondary | ICD-10-CM

## 2020-02-09 DIAGNOSIS — I959 Hypotension, unspecified: Secondary | ICD-10-CM

## 2020-02-09 NOTE — Progress Notes (Signed)
Patient [facility]   Established Patient Office Visit  Subjective:  Patient ID: Tina Watkins, female    DOB: 11/28/1965  Age: 55 y.o. MRN: [id number]  CC: No chief complaint on file.   HPI Modena Bellemare is a 55 year old female how presents for Follow Up today.    Patient Active Problem List   Diagnosis Date Noted  . Anemia 09/02/2019  . Decubitus ulcer of sacral region, stage 1 08/18/2019  . HCAP (healthcare-associated pneumonia) 08/05/2019  . Pressure injury of skin 07/24/2019  . Chest congestion 07/24/2019  . Itching 07/24/2019  . Weakness 06/13/2019  . Pneumonia due to COVID-19 virus 05/09/2019  . Hypotension 05/09/2019  . Symptomatic anemia 05/09/2019  . CHF (congestive heart failure) ([place]) 05/09/2019  . Swollen abdomen 01/04/2019  . DDD (degenerative disc disease), cervical 12/06/2018  . Neuropathy 12/06/2018  . Paresthesia 10/11/2018  . Neck pain 10/11/2018  . Tobacco abuse 11/20/2017  . Right leg swelling 11/20/2017  . CKD (chronic kidney disease), stage III ([place]) 11/20/2017  . Right leg pain 11/20/2017  . Stroke (cerebrum) ([place]) 11/20/2017  . GERD (gastroesophageal reflux disease) 11/20/2017  . Acute venous embolism and thrombosis of deep vessels of proximal lower extremity, right ([place]) 11/20/2017  . 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 Thrush, oral   . AKI (acute kidney injury) ([place])   . Multifocal pneumonia 08/09/2017  . Chest pain 09/29/2015  . Cervical stenosis of spinal canal 01/08/2015  . Urinary tract infectious disease   . Muscle spasms of neck 06/15/2014  . Bleeding hemorrhoid 06/15/2014  . Anemia in chronic kidney disease 06/15/2014  . Pyelonephritis, acute 06/13/2014  . Sepsis ([place]) 06/13/2014  . History of kidney transplant   . Chronic pain syndrome 04/09/2012  . Dehydration, mild 04/09/2012  . Gout attack 06/23/2011  . Herpes  infection 06/23/2011  . Anxiety 06/23/2011  . E coli bacteremia 06/18/2011  . ESRD (end stage renal disease) ([place]) 06/12/2011  . UTI (lower urinary tract infection) 06/12/2011  . Bacteremia due to Gram-negative bacteria 05/23/2011  . History of renal transplantation 05/22/2011  . Septic shock(785.52) 05/22/2011  . Acute on chronic kidney failure ([place]) 05/22/2011  . Gastroparesis 04/24/2008  . WEIGHT LOSS 08/24/2007  . NAUSEA AND VOMITING 08/24/2007    Current Status: Since her last office visit, she has c/o increased anxiety lately. She denies suicidal ideations, homicidal ideations, or auditory hallucinations. She continues to have dialysis treatments on Tuesdays, Thursdays, and Saturdays. She denies fevers, chills, fatigue, recent infections, weight loss, and night sweats. She has not had any headaches, visual changes, dizziness, and falls. No chest pain, heart palpitations, cough and shortness of breath reported. Denies GI problems such as nausea, vomiting, diarrhea, and constipation. She has no reports of blood in stools, dysuria and hematuria. She is taking all medications as prescribed. She denies pain today.   Past Medical History:  Diagnosis Date  . Bacteremia due to Gram-negative bacteria 05/23/2011  . Blind    right eye  . Blind right eye   . CHF (congestive heart failure) ([place])   . Chronic lower back pain   . Complication of anesthesia    "woke up during OR; I have an extremely high tolerance" (12/11/2011) 1 procedure was graft; the other procedures were procedures that are typically done with sedation.  . DDD (degenerative disc disease), cervical   . Depression   .  Dysrhythmia    "tachycardia" (12/11/2011) new onset afib 10/15/14 EKG  . E coli bacteremia 06/18/2011  . Elevated LDL cholesterol level 12/2018  . ESRD (end stage renal disease) ([place]) 06/12/2011  . Fibromyalgia   . Gastroparesis   . Gastroparesis   . GERD (gastroesophageal reflux disease)   . Glaucoma    right  eye  . Gout   . H/O carpal tunnel syndrome   . Headache 10/2019  . Headache(784.0)    "not often anymore" (12/11/2011)  . Herpes genitalia 1994  . History of blood transfusion    "more than a few times" (12/11/2011)  . History of stomach ulcers   . Hypotension   . Iron deficiency anemia   . New onset a-fib ([place])    10/15/14 EKG  . Osteopenia   . Peripheral neuropathy 11/2018  . Pressure ulcer of sacral region, stage 1 07/2019  . Seizures ([place]) 1994   "post transplant; only have had that one" (12/11/2011)  . Spinal stenosis in cervical region   . Stroke [facility])     left basal ganglia lacunar infarct; Right frontal lobe lacunar infarct.  . Stroke [facility]) ~ 1999; 2001   "briefly lost my vision; lost my right eye" (12/11/2011)  . Vitamin D deficiency 12/2018    Past Surgical History:  Procedure Laterality Date  . ANTERIOR CERVICAL DECOMP/DISCECTOMY FUSION N/A 01/08/2015   Procedure: Anterior Cervical Three-Four/Four-Five Decompression/Diskectomy/Fusion;  Surgeon: Leeroy Cha, MD;  Location: [facility];  Service: Neurosurgery;  Laterality: N/A;  C3-4 C4-5 Anterior cervical decompression/diskectomy/fusion  . APPENDECTOMY  ~ 2004  . CATARACT EXTRACTION     right eye  . COLONOSCOPY    . ENUCLEATION  2001   "right"  . ESOPHAGOGASTRODUODENOSCOPY (EGD) WITH PROPOFOL N/A 04/21/2012   Procedure: ESOPHAGOGASTRODUODENOSCOPY (EGD) WITH PROPOFOL;  Surgeon: Milus Banister, MD;  Location: [facility];  Service: Endoscopy;  Laterality: N/A;  . 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 IR FLUORO GUIDE CV LINE RIGHT  07/11/2019  . IR FLUORO GUIDE CV LINE RIGHT  10/20/2019  . IR RADIOLOGY PERIPHERAL GUIDED IV START  07/11/2019  . IR US GUIDE VASC ACCESS RIGHT  07/11/2019  . IR US GUIDE VASC ACCESS RIGHT  07/11/2019  . IR US GUIDE VASC ACCESS RIGHT  07/11/2019  . [place]; 1999; 2005   "right"  . MULTIPLE TOOTH EXTRACTIONS    .  RIGHT HEART CATH N/A 03/23/2019   Procedure: RIGHT HEART CATH;  Surgeon: Jolaine Artist, MD;  Location: [facility];  Service: Cardiovascular;  Laterality: N/A;  . TONSILLECTOMY    . TOTAL NEPHRECTOMY  1988?; 1994; 2005    Family History  Problem Relation Age of Onset  . Glaucoma Mother   . Pancreatic cancer Father   . Multiple sclerosis Brother   . Hypertension Maternal Grandmother   . Breast cancer Neg Hx     Social History   Socioeconomic History  . Marital status: Single    Spouse name: Not on file  . Number of children: 0  . Years of education: some college  . Highest education level: Some college, no degree  Occupational History  . Occupation: disabled    Fish farm manager: UNEMPLOYED  Tobacco Use  . Smoking status: Current Every Day Smoker    Packs/day: 0.33    Years: 35.00    Pack years: 11.55    Types: Cigarettes  .  Smokeless tobacco: Never Used  . Tobacco comment: has patches but not currently using - plan to start using first of January 2022  Vaping Use  . Vaping Use: Never used  Substance and Sexual Activity  . Alcohol use: Yes    Comment: rare if out to dinner  . Drug use: Yes    Frequency: 1.0 times per week    Types: Marijuana    Comment: smoking once a day when in severe pain  . Sexual activity: Yes    Partners: Male    Birth control/protection: None  Other Topics Concern  . Not on file  Social History Narrative   Right-handed.   Four cups caffeine per day.   Lives alone.   Social Determinants of Health   Financial Resource Strain: Low Risk   . Difficulty of Paying Living Expenses: Not hard at all  Food Insecurity: No Food Insecurity  . Worried About Charity fundraiser in the Last Year: Never true  . Ran Out of Food in the Last Year: Never true  Transportation Needs: No Transportation Needs  . Lack of Transportation (Medical): No  . Lack of Transportation (Non-Medical): No  Physical Activity: Insufficiently Active  . Days of Exercise  per Week: 3 days  . Minutes of Exercise per Session: 10 min  Stress: Stress Concern Present  . Feeling of Stress : To some extent  Social Connections: Moderately Isolated  . Frequency of Communication with Friends and Family: More than three times a week  . Frequency of Social Gatherings with Friends and Family: More than three times a week  . Attends Religious Services: More than 4 times per year  . Active Member of Clubs or Organizations: No  . Attends Archivist Meetings: Never  . Marital Status: Never married  Intimate Partner Violence: Not At Risk  . Fear of Current or Ex-Partner: No  . Emotionally Abused: No  . Physically Abused: No  . Sexually Abused: No    Outpatient Medications Prior to Visit  Medication Sig Dispense Refill  . allopurinol (ZYLOPRIM) 100 MG tablet Take 1 tablet (100 mg total) by mouth daily. 90 tablet 3  . aspirin EC 81 MG tablet Take 81 mg by mouth daily.     . calcium acetate, Phos Binder, (PHOSLYRA) 667 MG/5ML SOLN Take by mouth 3 (three) times daily with meals.    . colchicine 0.6 MG tablet Take 0.5 tablets (0.3 mg total) by mouth daily. 30 tablet 0  . diphenhydrAMINE (BENADRYL) 25 mg capsule Take 1 capsule (25 mg total) by mouth every 8 (eight) hours as needed. (Patient taking differently: Take 25 mg by mouth every 8 (eight) hours as needed for itching.) 30 capsule 6  . famotidine (PEPCID) 20 MG tablet Take 1 tablet (20 mg total) by mouth 2 (two) times daily. 60 tablet 11  . gabapentin (NEURONTIN) 100 MG capsule Take 100 mg by mouth 2 (two) times daily.     [name] latanoprost (XALATAN) 0.005 % ophthalmic solution Place 1 drop into the left eye at bedtime.  3  . lidocaine (LIDODERM) 5 % Place 1 patch onto the skin daily as needed (for pain- remove old patch first).     . Methoxy PEG-Epoetin Beta (MIRCERA IJ) Mircera    . midodrine (PROAMATINE) 10 MG tablet Take 1.5 tablets (15 mg total) by mouth 3 (three) times daily. (Patient taking differently: Take  15 mg by mouth 3 (three) times daily. Patient taking 1.0mg ) 135 tablet 1  .  ondansetron (ZOFRAN-ODT) 4 MG disintegrating tablet Take 1 tablet (4 mg total) by mouth every 8 (eight) hours as needed for nausea or vomiting. 20 tablet 11  . oxyCODONE-acetaminophen (PERCOCET) 10-325 MG tablet Take 1 tablet by mouth 4 (four) times daily as needed.     . predniSONE (DELTASONE) 5 MG tablet Take 5 mg by mouth daily with breakfast.    . tacrolimus (PROGRAF) 1 MG capsule Take 2-3 mg by mouth See admin instructions. Take 3 mg by mouth in the morning and 2 mg at bedtime    . Vitamin D, Ergocalciferol, (DRISDOL) 1.25 MG (50000 UNIT) CAPS capsule TAKE 1 CAPSULE (50,000 UNITS TOTAL) BY MOUTH EVERY 7 (SEVEN) DAYS. 5 capsule 6  . zolpidem (AMBIEN) 10 MG tablet Take 1 tablet (10 mg total) by mouth at bedtime as needed for sleep. 10 tablet 0   No facility-administered medications prior to visit.    Allergies  Allergen Reactions  . Levofloxacin Itching and Rash  . Tape Other (See Comments)    "Certain surgical tapes peel off my skin"    ROS Review of Systems  Constitutional: Negative.   HENT: Negative.   Eyes: Negative.   Respiratory: Negative.   Cardiovascular: Positive for palpitations (occasional ).  Gastrointestinal: Negative.   Endocrine: Negative.   Genitourinary: Negative.        Dialysis patient  Musculoskeletal: Positive for arthralgias (generalized joint pain).  Skin: Negative.   Allergic/Immunologic: Negative.   Neurological: Positive for dizziness (occasional ) and weakness (generalized. ).  Hematological: Negative.   Psychiatric/Behavioral: Negative.     Objective:    Physical Exam Vitals and nursing note reviewed.  Constitutional:      Appearance: Normal appearance.  HENT:     Head: Normocephalic and atraumatic.     Nose: Nose normal.     Mouth/Throat:     Mouth: Mucous membranes are moist.     Pharynx: Oropharynx is clear.  Cardiovascular:     Rate and Rhythm: Normal rate  and regular rhythm.     Pulses: Normal pulses.     Heart sounds: Normal heart sounds.  Pulmonary:     Effort: Pulmonary effort is normal.     Breath sounds: Normal breath sounds.  Abdominal:     General: Bowel sounds are normal.     Palpations: Abdomen is soft.  Musculoskeletal:        General: Normal range of motion.     Cervical back: Normal range of motion and neck supple.  Skin:    General: Skin is warm and dry.  Neurological:     General: No focal deficit present.     Mental Status: She is alert and oriented to person, place, and time.  Psychiatric:        Behavior: Behavior normal.        Thought Content: Thought content normal.        Judgment: Judgment normal.     There were no vitals taken for this visit. Wt Readings from Last 3 Encounters:  12/13/19 102 lb (46.3 kg)  12/12/19 105 lb (47.6 kg)  10/20/19 114 lb 3.2 oz (51.8 kg)     Health Maintenance Due  Topic Date Due  . TETANUS/TDAP  Never done  . PAP SMEAR-Modifier  Never done  . COLONOSCOPY (Pts 45-64yrs Insurance coverage will need to be confirmed)  Never done  . COVID-19 Vaccine (2 - Pfizer risk 4-dose series) 11/16/2019    There are no preventive care reminders to display for  this patient.  Lab Results  Component Value Date   TSH 2.469 09/15/2019   Lab Results  Component Value Date   WBC 8.0 02/04/2020   HGB 12.9 02/04/2020   HCT 41.7 02/04/2020   MCV 87.1 02/04/2020   PLT 244 02/04/2020   Lab Results  Component Value Date   NA 136 02/04/2020   K 3.5 02/04/2020   CO2 26 02/04/2020   GLUCOSE 81 02/04/2020   BUN 17 02/04/2020   CREATININE 3.31 (H) 02/04/2020   BILITOT 0.7 10/13/2019   ALKPHOS 247 (H) 10/13/2019   AST 44 (H) 10/13/2019   ALT 34 10/13/2019   PROT 7.0 10/13/2019   ALBUMIN 3.6 10/13/2019   CALCIUM 9.1 02/04/2020   ANIONGAP 15 02/04/2020   Lab Results  Component Value Date   CHOL 214 (H) 12/06/2018   Lab Results  Component Value Date   HDL 89 12/06/2018   Lab  Results  Component Value Date   LDLCALC 105 (H) 12/06/2018   Lab Results  Component Value Date   TRIG 118 12/06/2018   Lab Results  Component Value Date   CHOLHDL 2.4 12/06/2018   Lab Results  Component Value Date   HGBA1C 4.8 05/08/2019    Assessment & Plan:   1. Anxiety - Ambulatory referral to Psychiatry  2. Dialysis patient ([place])  3. ESRD (end stage renal disease) on dialysis ([place])  4. Congestive heart failure, unspecified HF chronicity, unspecified heart failure type (HCC)  5. Pain in both knees, unspecified chronicity  6. Weakness  7. Hypotension, unspecified hypotension type History. Stable today.   8. Nausea  9. Follow up She will follow up 07/2020.   No orders of the defined types were placed in this encounter.   Orders Placed This Encounter  Procedures  . Ambulatory referral to Psychiatry     Referral Orders     Ambulatory referral to Psychiatry   Kathe Becton, MSN, ANE, FNP-BC [facility] [address], [place] [address] [phone]- fax   Problem List Items Addressed This Visit      Cardiovascular and Mediastinum   CHF (congestive heart failure) ([place])   Hypotension     Other   Anxiety - Primary (Chronic)   Relevant Orders   Ambulatory referral to Psychiatry   Weakness    Other Visit Diagnoses    Dialysis patient ([place])       ESRD (end stage renal disease) on dialysis ([place])       Pain in both knees, unspecified chronicity       Nausea       Follow up          No orders of the defined types were placed in this encounter.   Follow-up: No follow-ups on file.    [name], FNP

## 2020-02-12 ENCOUNTER — Encounter: Payer: Self-pay | Admitting: Family Medicine

## 2020-02-14 ENCOUNTER — Encounter: Payer: Self-pay | Admitting: Family Medicine

## 2020-02-15 ENCOUNTER — Encounter: Payer: Self-pay | Admitting: Family Medicine

## 2020-02-16 ENCOUNTER — Other Ambulatory Visit: Payer: Self-pay

## 2020-02-16 NOTE — Patient Instructions (Signed)
Visit Information  [name]  - as a part of your Medicaid benefit, you are eligible for care management and care coordination services at no cost or copay. I was unable to reach you by phone today but would be happy to help you with your health related needs.   A member of the Managed Medicaid care management team will reach out to you again over the next 7 days.   Netta Neat, BSW, MSW, LCSW Social Work Case Freight forwarder - [place]  Direct [place]: [phone]

## 2020-02-16 NOTE — Patient Outreach (Signed)
Care Coordination  02/16/2020  Tina Watkins 1965/10/24 [id number]   Medicaid Managed Care   Unsuccessful Outreach Note  02/16/2020 Name: [name] MRN: [id number] DOB: Nov 01, 1965  Referred by: [name], FNP Reason for referral : High Risk Managed Medicaid (Unsuccessful outreach attempt)   An unsuccessful telephone outreach was attempted today. The patient was referred to the case management team for assistance with care management and care coordination.   Follow Up Plan: The patient has been provided with contact information for the care management team and has been advised to call with any health related questions or concerns.  A HIPAA compliant phone message was left for the patient providing contact information and requesting a return call.  The care management team will reach out to the patient again over the next 7 days.   Netta Neat, BSW, MSW, LCSW Social Work Case Freight forwarder - [place]  Direct [place]: [phone]

## 2020-02-19 ENCOUNTER — Other Ambulatory Visit: Payer: Self-pay

## 2020-02-19 NOTE — Patient Instructions (Signed)
Visit Information Thank you for speaking with me today.  Tina Watkins was given information about Medicaid Managed Care team care coordination services as a part of their Healthy [facility] Medicaid benefit. [name] verbally consented to engagement with the [facility] Managed Care team.   For questions related to your Healthy [facility] health plan, please call: [phone] or visit the homepage here: [email or web address]  If you would like to schedule transportation through your Healthy [facility] plan, please call the following number at least 2 days in advance of your appointment: [phone]  Tina Watkins - following are the goals we discussed in your visit today:  Goals Addressed   None       Social Worker will follow up with patient in 30 days.   Tina Watkins  Following is a copy of your plan of care:  Patient Care Plan: General Plan of Care (Adult)    Problem Identified: Health Promotion or Disease Self-Management (General Plan of Care)   Priority: High  Onset Date: 09/13/2019  Note:   CARE PLAN ENTRY Medicaid Managed Care (see longitudinal plan of care for additional care plan information)  Current Barriers:  . Care coordination needs related to physical mobility and therapy need . Patient discharged from [facility] after short-term stay for rehab. Her facility SW states she was discharged with home health - unable to determine agency. Patient has not started services with an agency. [name] Update 11/22/2019: Patient stated she has not heard anything from her PCP regarding physical therapy nor has she been contacted.  [name] Update 12/22/19: Patient states she does not need HHPT services at this time  Clinical Social Work Clinical Goal(s):  [name] Over the next 14 days, patient will work with CM team to address needs related to home physical therapy. Patient needs help her with her back pain. [name] Update 11/22/2019: Over the next 60  days patient will work with the Managed Medicaid team to address her needs related to home physical therapy. [name] Update 12/22/19:  Patient states she does not need HHPT services right now-she utilizes walker and cane as needed in addition to prescribed medications.   Interventions: . Inter-disciplinary care team collaboration (see longitudinal plan of care) . Patient interviewed and appropriate assessments performed . Referred to [place] colleague for follow-up regarding home care needs.  [name] Update 11/22/2019: LCSW will collaborate with PCP regarding patient's concern and explain that a referral may need to be resubmitted. [name] Update 12/22/19:  Patient states she would like additional DME-walker with seat.  RNCM will contact PCP regarding this possibility. [name] Update 01/24/20:  patient states she has not heard anything from her PCP, RNCM will follow up today. [name] Update 12/25/2019: Patient states she doesn't think she needs physical therapy. She stated she had an examination recently that showed calcification on her knee. She also states she now knows that the trouble she has walking is sometimes due to her gout.   Patient Self Care Activities:  . Patient will work with CM team over the next 30 days. [name] Update 11/22/2019:  Patient stated she doesn't know the reason she has not been contacted about physical therapy.

## 2020-02-19 NOTE — Patient Outreach (Signed)
Medicaid Managed Care Social Work Note  02/19/2020 Name:  Tina Watkins MRN:  [id number] DOB:  04-12-1965  Tina Watkins is an 55 y.o. year old female who is a primary patient of [name], FNP.  The [facility] Managed Care Coordination team was consulted for assistance with:  in home aides and follow up  Tina Watkins was given information about Medicaid Managed CareCoordination services today. Tina Watkins agreed to services and verbal consent obtained.  Engaged with patient  for by telephone forfollow up visit in response to referral for case management and/or care coordination services.   Assessments/Interventions:  Review of past medical history, allergies, medications, health status, including review of consultants reports, laboratory and other test data, was performed as part of comprehensive evaluation and provision of chronic care management services.  SDOH: (Social Determinant of Health) assessments and interventions performed:  BSW contacted patient to check in with her. Patient stated that she is doing well, she has not been in any pain. She recently just switched to Medicare and is waiting for them to contact her about getting an in home aide. Patient stated she had to pay someone out of pocket to clean her home twice a week and that put a dent in her savings account.    Advanced Directives Status:  Not addressed in this encounter.  Care Plan                 Allergies  Allergen Reactions  . Levofloxacin Itching and Rash  . Tape Other (See Comments)    "Certain surgical tapes peel off my skin"    Medications Reviewed Today    Reviewed by [name], FNP (Family Nurse Practitioner) on 02/15/20 at 1638  Med List Status: <None>  Medication Order Taking? Sig Documenting Provider Last Dose Status Informant  allopurinol (ZYLOPRIM) 100 MG tablet [id number] No Take 1 tablet (100 mg total) by mouth daily. [name], FNP Taking  Active   aspirin EC 81 MG tablet [id number] No Take 81 mg by mouth daily.  [provider] Taking Active Self  calcium acetate, Phos Binder, (PHOSLYRA) 667 MG/5ML SOLN [id number] No Take by mouth 3 (three) times daily with meals. [provider] Taking Active   colchicine 0.6 MG tablet [id number] No Take 0.5 tablets (0.3 mg total) by mouth daily. [name], MD Taking Active Self           Med Note [name], Benjamine Mola A   Fri Sep 15, 2019  8:21 AM) Just takes when has gout flare  diphenhydrAMINE (BENADRYL) 25 mg capsule [id number] No Take 1 capsule (25 mg total) by mouth every 8 (eight) hours as needed.  Patient taking differently: Take 25 mg by mouth every 8 (eight) hours as needed for itching.   [name], FNP Taking Active Self  famotidine (PEPCID) 20 MG tablet [id number] No Take 1 tablet (20 mg total) by mouth 2 (two) times daily. [name], FNP Taking Active Self  gabapentin (NEURONTIN) 100 MG capsule [id number] No Take 100 mg by mouth 2 (two) times daily.  [provider] Taking Active Self  latanoprost (XALATAN) 0.005 % ophthalmic solution [id number] No Place 1 drop into the left eye at bedtime. [provider] Taking Active Self           Med Note [name] Apr 17, 2019  6:42 PM)    lidocaine (LIDODERM) 5 % [id number] No Place 1 patch onto the skin  daily as needed (for pain- remove old patch first).  [provider] Taking Active Self  Methoxy PEG-Epoetin Beta (MIRCERA IJ) [id number] No Mircera [provider] Taking Active   midodrine (PROAMATINE) 10 MG tablet [id number] No Take 1.5 tablets (15 mg total) by mouth 3 (three) times daily.  Patient taking differently: Take 15 mg by mouth 3 (three) times daily. Patient taking 1.0mg    Bensimhon, Shaune Pascal, MD Taking Active   ondansetron (ZOFRAN-ODT) 4 MG disintegrating tablet [id number] No Take 1 tablet (4 mg total) by mouth every 8 (eight) hours as needed for nausea or  vomiting. [name], FNP Taking Active Self  oxyCODONE-acetaminophen (PERCOCET) 10-325 MG tablet [id number] No Take 1 tablet by mouth 4 (four) times daily as needed.  [provider] Taking Active   predniSONE (DELTASONE) 5 MG tablet [id number] No Take 5 mg by mouth daily with breakfast. [provider] Taking Active   tacrolimus (PROGRAF) 1 MG capsule [id number] No Take 2-3 mg by mouth See admin instructions. Take 3 mg by mouth in the morning and 2 mg at bedtime [name], MD Taking Active Self           Med Note [name] Apr 17, 2019  6:42 PM)    Vitamin D, Ergocalciferol, (DRISDOL) 1.25 MG (50000 UNIT) CAPS capsule [id number] No TAKE 1 CAPSULE (50,000 UNITS TOTAL) BY MOUTH EVERY 7 (SEVEN) DAYS. [name], FNP Taking Active   zolpidem (AMBIEN) 10 MG tablet [id number] No Take 1 tablet (10 mg total) by mouth at bedtime as needed for sleep. [name], DO Taking Active Self          Patient Active Problem List   Diagnosis Date Noted  . Anemia 09/02/2019  . Decubitus ulcer of sacral region, stage 1 08/18/2019  . HCAP (healthcare-associated pneumonia) 08/05/2019  . Pressure injury of skin 07/24/2019  . Chest congestion 07/24/2019  . Itching 07/24/2019  . Weakness 06/13/2019  . Pneumonia due to COVID-19 virus 05/09/2019  . Hypotension 05/09/2019  . Symptomatic anemia 05/09/2019  . CHF (congestive heart failure) ([place]) 05/09/2019  . Swollen abdomen 01/04/2019  . DDD (degenerative disc disease), cervical 12/06/2018  . Neuropathy 12/06/2018  . Paresthesia 10/11/2018  . Neck pain 10/11/2018  . Tobacco abuse 11/20/2017  . Right leg swelling 11/20/2017  . CKD (chronic kidney disease), stage III ([place]) 11/20/2017  . Right leg pain 11/20/2017  . Stroke (cerebrum) ([place]) 11/20/2017  . GERD (gastroesophageal reflux disease) 11/20/2017  . Acute venous embolism and thrombosis of deep vessels of proximal lower extremity, right ([place]) 11/20/2017   . 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 Thrush, oral   . AKI (acute kidney injury) ([place])   . Multifocal pneumonia 08/09/2017  . Chest pain 09/29/2015  . Cervical stenosis of spinal canal 01/08/2015  . Urinary tract infectious disease   . Muscle spasms of neck 06/15/2014  . Bleeding hemorrhoid 06/15/2014  . Anemia in chronic kidney disease 06/15/2014  . Pyelonephritis, acute 06/13/2014  . Sepsis ([place]) 06/13/2014  . History of kidney transplant   . Chronic pain syndrome 04/09/2012  . Dehydration, mild 04/09/2012  . Gout attack 06/23/2011  . Herpes infection 06/23/2011  . Anxiety 06/23/2011  . E coli bacteremia 06/18/2011  . ESRD (end stage renal disease) ([place]) 06/12/2011  . UTI (lower urinary tract infection) 06/12/2011  . Bacteremia due to Gram-negative bacteria 05/23/2011  . History of  renal transplantation 05/22/2011  . Septic shock(785.52) 05/22/2011  . Acute on chronic kidney failure ([place]) 05/22/2011  . Gastroparesis 04/24/2008  . WEIGHT LOSS 08/24/2007  . NAUSEA AND VOMITING 08/24/2007    Conditions to be addressed/monitored per PCP order:  in home aide prescription  There are no care plans that you recently modified to display for this patient.   Follow up:  Patient requests no follow-up at this time.  Plan: The Managed Medicaid care management team will reach out to the patient again over the next 30 days.  Date/time of next scheduled Social Work care management/care coordination outreach:  03/22/20 at 10:30  Mickel Fuchs, [place], [place] Medicaid Team

## 2020-02-20 ENCOUNTER — Telehealth (HOSPITAL_COMMUNITY): Payer: Self-pay | Admitting: Licensed Clinical Social Worker

## 2020-02-21 ENCOUNTER — Other Ambulatory Visit: Payer: Self-pay

## 2020-02-21 ENCOUNTER — Other Ambulatory Visit: Payer: Self-pay | Admitting: Obstetrics and Gynecology

## 2020-02-21 ENCOUNTER — Telehealth: Payer: Self-pay | Admitting: Family Medicine

## 2020-02-21 NOTE — Telephone Encounter (Signed)
Referral Followup °

## 2020-02-21 NOTE — Patient Instructions (Signed)
Hi [name], thank you for speaking with me today.  Tina Watkins was given information about Medicaid Managed Care team care coordination services as a part of their Healthy [facility] Medicaid benefit. Tina Watkins verbally consented to engagement with the [facility] Managed Care team.   For questions related to your Healthy [facility] health plan, please call: [phone] or visit the homepage here: [email or web address]  If you would like to schedule transportation through your Healthy [facility] plan, please call the following number at least 2 days in advance of your appointment: [phone]  [name] - following are the goals we discussed in your visit today:  Goals Addressed              This Visit's Progress   .  "I want an aide to come to the home to help me a little bit with some light housework and meal preparation." (pt-stated)        Tina Watkins (see longitudinal plan of care for additional care plan information)  Current Barriers:  . Over the next 60 days, patient will work with CM team to address in-home personal care needs. Patient requests additional assistance with ADL/IADL. Home health PT consult pending.Update 12/22/19-patient states she does not need HHPT services right now. [name] Update 01/29/2020:  Patient stated she received a call on 01/25/2020 from home health agency that Medicaid will no longer pay for an aide to work with her. Patient stated she does not know the reason this happened, but she will contact DSS Medicaid caseworker and her PCP for more information.  Clinical Social Work Clinical Goal(s):  [name] Over the next 30 days, patient will work with CM team to address needs related to in-home personal care needs.  Patient discharged from rehab in late August and is currently residing at home alone and receives no services. LCSW will collaborate with PCP to address her needs. [name] Update  01/29/2020: LCSW will collaborate with PCP for additional information regarding termination of the aide's services.  Interventions: . Inter-disciplinary care team collaboration (see longitudinal plan of care) . Patient interviewed and appropriate assessments performed . Provided education to patient/caregiver regarding level of care options. Patient may be eligible for [facility] services; will collaborate with home health PT regarding personal care needs.  Update:  RNCM spoke to patient regarding PCS and she has not received any updates to date-will follow up with PCP. [name] Update 11/22/2019: LCSW will collaborate with BSW and PCP regarding PCS services. . BSW contacted [employer] at [phone] for [place] stated that PCP has to complete referral form. [name] Update 12/20/19 Patient states that she is now getting PCS services MWF. Update 12/22/19-Patient receiving house keeping and food preparation services-she is very pleased with these services . 01/24/20-[facility] providing services. [name] Update 01/29/2020: Patient stated that on 01/25/2020, she received a call from the home health agency that Medicaid would no longer pay for an aide and the services were terminated. LCSW will collaborate with PCP regarding termination. Patient stated she will contact her DSS Medicaid worker and also her PCP for additional information and assistance with getting her aide to start back working with her. [name] Update 02/21/20:  PCS services have not restarted to date.  RNCM will follow up with PCP.  Patient Self Care Activities:  . Patient will work with CM team to address personal care needs. . Licensed Clinical Social Worker will collaborate with MM care team and PCP office to address  personal care needs. [name] Update 01/29/2020: LCSW will follow up on 02/16/2020 @ 11:00am.      Patient verbalizes understanding of instructions provided today.   RN Care Manager will follow up with PCP regarding PCS and  follow up with BSW regarding therapy services.  Aida Raider RN, BSN [place]  [facility] - Managed Medicaid High Risk [phone].

## 2020-02-21 NOTE — Patient Outreach (Signed)
Medicaid Managed Care   Nurse Care Manager Note  02/21/2020 Name:  Tina Watkins MRN:  [id number] DOB:  January 01, 1966  Tina Watkins is an 55 y.o. year old female who is a primary patient of [name], FNP.  The [facility] Managed Care Coordination team was consulted for assistance with:    chronic healthcare management needs.  Ms. Fabio was given information about Medicaid Managed Care Coordination team services today. [name] agreed to services and verbal consent obtained.  Engaged with patient by telephone for follow up visit in response to provider referral for case management and/or care coordination services.   Assessments/Interventions:  Review of past medical history, allergies, medications, health status, including review of consultants reports, laboratory and other test data, was performed as part of comprehensive evaluation and provision of chronic care management services.  SDOH (Social Determinants of Health) assessments and interventions performed:   Care Plan  Allergies  Allergen Reactions  . Levofloxacin Itching and Rash  . Tape Other (See Comments)    "Certain surgical tapes peel off my skin"    Medications Reviewed Today    Reviewed by Gayla Medicus, RN (Registered Nurse) on 02/21/20 at [phone]  Med List Status: <None>  Medication Order Taking? Sig Documenting Provider Last Dose Status Informant  allopurinol (ZYLOPRIM) 100 MG tablet [id number] No Take 1 tablet (100 mg total) by mouth daily. [name], FNP Taking Active   aspirin EC 81 MG tablet [id number] No Take 81 mg by mouth daily.  [provider] Taking Active Self  calcium acetate, Phos Binder, (PHOSLYRA) 667 MG/5ML SOLN [id number] No Take by mouth 3 (three) times daily with meals. [provider] Taking Active   colchicine 0.6 MG tablet [id number] No Take 0.5 tablets (0.3 mg total) by mouth daily. [name], MD Taking Active Self            Med Note [name], Benjamine Mola A   Fri Sep 15, 2019  8:21 AM) Just takes when has gout flare  diphenhydrAMINE (BENADRYL) 25 mg capsule [id number] No Take 1 capsule (25 mg total) by mouth every 8 (eight) hours as needed.  Patient taking differently: Take 25 mg by mouth every 8 (eight) hours as needed for itching.   [name], FNP Taking Active Self  famotidine (PEPCID) 20 MG tablet [id number] No Take 1 tablet (20 mg total) by mouth 2 (two) times daily. [name], FNP Taking Active Self  gabapentin (NEURONTIN) 100 MG capsule [id number] No Take 100 mg by mouth 2 (two) times daily.  [provider] Taking Active Self  latanoprost (XALATAN) 0.005 % ophthalmic solution [id number] No Place 1 drop into the left eye at bedtime. [provider] Taking Active Self           Med Note [name] Apr 17, 2019  6:42 PM)    lidocaine (LIDODERM) 5 % [id number] No Place 1 patch onto the skin daily as needed (for pain- remove old patch first).  [provider] Taking Active Self  Methoxy PEG-Epoetin Beta (MIRCERA IJ) [id number] No Mircera [provider] Taking Active   midodrine (PROAMATINE) 10 MG tablet [id number] No Take 1.5 tablets (15 mg total) by mouth 3 (three) times daily.  Patient taking differently: Take 15 mg by mouth 3 (three) times daily. Patient taking 1.0mg    Bensimhon, Shaune Pascal, MD Taking Active   ondansetron (ZOFRAN-ODT) 4 MG disintegrating tablet [id number] No Take 1 tablet (4 mg total)  by mouth every 8 (eight) hours as needed for nausea or vomiting. [name], FNP Taking Active Self  oxyCODONE-acetaminophen (PERCOCET) 10-325 MG tablet [id number] No Take 1 tablet by mouth 4 (four) times daily as needed.  [provider] Taking Active   predniSONE (DELTASONE) 5 MG tablet [id number] No Take 5 mg by mouth daily with breakfast. [provider] Taking Active   tacrolimus (PROGRAF) 1 MG capsule [id number] No Take 2-3 mg by mouth  See admin instructions. Take 3 mg by mouth in the morning and 2 mg at bedtime [name], MD Taking Active Self           Med Note [name] Apr 17, 2019  6:42 PM)    Vitamin D, Ergocalciferol, (DRISDOL) 1.25 MG (50000 UNIT) CAPS capsule [id number] No TAKE 1 CAPSULE (50,000 UNITS TOTAL) BY MOUTH EVERY 7 (SEVEN) DAYS. [name], FNP Taking Active   zolpidem (AMBIEN) 10 MG tablet [id number] No Take 1 tablet (10 mg total) by mouth at bedtime as needed for sleep. [name], DO Taking Active Self          Patient Active Problem List   Diagnosis Date Noted  . Anemia 09/02/2019  . Decubitus ulcer of sacral region, stage 1 08/18/2019  . HCAP (healthcare-associated pneumonia) 08/05/2019  . Pressure injury of skin 07/24/2019  . Chest congestion 07/24/2019  . Itching 07/24/2019  . Weakness 06/13/2019  . Pneumonia due to COVID-19 virus 05/09/2019  . Hypotension 05/09/2019  . Symptomatic anemia 05/09/2019  . CHF (congestive heart failure) ([place]) 05/09/2019  . Swollen abdomen 01/04/2019  . DDD (degenerative disc disease), cervical 12/06/2018  . Neuropathy 12/06/2018  . Paresthesia 10/11/2018  . Neck pain 10/11/2018  . Tobacco abuse 11/20/2017  . Right leg swelling 11/20/2017  . CKD (chronic kidney disease), stage III ([place]) 11/20/2017  . Right leg pain 11/20/2017  . Stroke (cerebrum) ([place]) 11/20/2017  . GERD (gastroesophageal reflux disease) 11/20/2017  . Acute venous embolism and thrombosis of deep vessels of proximal lower extremity, right ([place]) 11/20/2017  . 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 Thrush, oral   . AKI (acute kidney injury) ([place])   . Multifocal pneumonia 08/09/2017  . Chest pain 09/29/2015  . Cervical stenosis of spinal canal 01/08/2015  . Urinary tract infectious disease   . Muscle spasms of neck 06/15/2014  . Bleeding hemorrhoid 06/15/2014  . Anemia in chronic kidney disease 06/15/2014  .  Pyelonephritis, acute 06/13/2014  . Sepsis ([place]) 06/13/2014  . History of kidney transplant   . Chronic pain syndrome 04/09/2012  . Dehydration, mild 04/09/2012  . Gout attack 06/23/2011  . Herpes infection 06/23/2011  . Anxiety 06/23/2011  . E coli bacteremia 06/18/2011  . ESRD (end stage renal disease) ([place]) 06/12/2011  . UTI (lower urinary tract infection) 06/12/2011  . Bacteremia due to Gram-negative bacteria 05/23/2011  . History of renal transplantation 05/22/2011  . Septic shock(785.52) 05/22/2011  . Acute on chronic kidney failure ([place]) 05/22/2011  . Gastroparesis 04/24/2008  . WEIGHT LOSS 08/24/2007  . NAUSEA AND VOMITING 08/24/2007    Conditions to be addressed/monitored per PCP order:  as stated above.  Follow Up:  Patient agrees to Care Plan.

## 2020-02-26 ENCOUNTER — Other Ambulatory Visit: Payer: Self-pay | Admitting: Family Medicine

## 2020-02-26 ENCOUNTER — Encounter: Payer: Self-pay | Admitting: Family Medicine

## 2020-02-27 ENCOUNTER — Telehealth (HOSPITAL_COMMUNITY): Payer: Self-pay

## 2020-02-27 NOTE — Telephone Encounter (Signed)
She would need to get it from PCP or Nephrology

## 2020-02-27 NOTE — Telephone Encounter (Signed)
Patient called wanting to know if you could prescribe her Xanax due to her experiencing tachycardia every time she goes to Dialysis now . She states that when it happens they want to send her to the ED but she doesn't want to keep going every single time.She states the nurse at Dialysis told her she may need to try Xanax. Please advise

## 2020-02-27 NOTE — Telephone Encounter (Signed)
Patient wants to know if there is anything else you can do for her regarding this issue. She reports that her pcp and nephrology can't prescribe Xanax.

## 2020-02-28 NOTE — Telephone Encounter (Signed)
Pt returned call regarding medications Reports she was told by PCP that they will not write for this medication and should follow up with therapy to get med written  advised this medication is not normally written and managed by cardiology however should Dr Haroldine Laws decide differently we will reach out. Pt report she will reach out to begin scheduling an appt with therapy

## 2020-03-01 ENCOUNTER — Ambulatory Visit (HOSPITAL_COMMUNITY)
Admission: RE | Admit: 2020-03-01 | Discharge: 2020-03-01 | Disposition: A | Discharge status: Home / Self Care | Payer: Medicare Other | Source: Ambulatory Visit | Attending: Family Medicine | Admitting: Family Medicine

## 2020-03-01 ENCOUNTER — Other Ambulatory Visit: Payer: Self-pay

## 2020-03-01 DIAGNOSIS — M25562 Pain in left knee: Secondary | ICD-10-CM | POA: Diagnosis present

## 2020-03-01 DIAGNOSIS — G8929 Other chronic pain: Secondary | ICD-10-CM | POA: Diagnosis present

## 2020-03-01 DIAGNOSIS — R9389 Abnormal findings on diagnostic imaging of other specified body structures: Secondary | ICD-10-CM | POA: Diagnosis not present

## 2020-03-05 NOTE — Progress Notes (Signed)
ADVANCED HF CLINIC NOTE  Referring Physician: Dr.Teparra Primary Cardiologist: Dr. Audie Box  Nephrologist: Dr. Royce Macadamia HF Cardiologist: Dr. Haroldine Laws   HPI: Tina Watkins is a 55 y.o. female with a hx of gout, gastroparesis, CVA, ESRD status post renal transplant, heart failure with preserved ejection fraction, hypertension who is being seen today for pre-op clearance for possible PD catheter placement at the request of Dr. Vevelyn Royals   In December gained a lot of fluid (~30 pounds) over the holidays and was referred to Dr. Audie Box. He recommended IV diuresis but she did not want to be admitted. Saw Dr. Marval Regal who switched to torsemide. She does not recall dose but says she is taking 1 in the morning and 1 at night. (likely 20 bid). No real effect. Weight down 3 pounds 130 -> 127. (baseline 112).   She was admitted to the hospital 2/15-19/21 for volume overload. Did very well with IV diuresis. Weigh down to 119 pounds. She did bump creatinine to 3.6 so diuretics held for a short time. RHC attempted but could not complete due to  Access challenges and inability to get into the PA. RA pressure was 3 and cardiac output was normal. There was evidence of multiple venous narrowings in the IVC.   Follow up in AHF clinic11/21 for pre-op surgical risk stratification for PD catheter. Started on HD over the summer. Says fluid has been ok. Tells me BP low at HD frequently and taking midodrine. Denies orthopnea or PND. Feels fatigued. No CP. I spoke to Dr. Royce Macadamia by phone and she reports that weight has been stable but suspect she has lost muscle mass due to poor appetite and recent illnesses. She has run out of access sites and needs PD. Midodrine increased to 15 mg tid and compression hose ordered for hypotension.  Today she returns for HF follow up. Has been having fast heart beats that occur during HD treatments, resolves with xanax. Says she feels like something is coming out of her chest.  Episode yesterday last about 1 hour. Went to the ED recently but left without being seen as the wait was too long. Denies increasing SOB, CP, dizziness, edema, or PND/Orthopnea. Appetite ok. No fever or chills. Weights stable. Taking all medications.  Has not started PD yet, says she will if she has no other choice. HD access in R groin.  ROS: All systems reviewed and negative except as per HPI.   Cardiac studies: [place] 2/21: RHC attempted but could not complete due to  Access challenges and inability to get into the PA. RA pressure was 3 and cardiac output was normal. There was evidence of multiple venous narrowings in the IVC.   Echo 12/20: EF 60-65%, mod increased LVH, Grade III DD, mod elevated PA pressure, RVSP 44.6 mmHg, moderate TR Echo 8/17: EF 60-65%, PA peak pressure 48 mmHg,  Past Medical History:  Diagnosis Date  . Bacteremia due to Gram-negative bacteria 05/23/2011  . Blind    right eye  . Blind right eye   . CHF (congestive heart failure) ([place])   . Chronic lower back pain   . Complication of anesthesia    "woke up during OR; I have an extremely high tolerance" (12/11/2011) 1 procedure was graft; the other procedures were procedures that are typically done with sedation.  . DDD (degenerative disc disease), cervical   . Depression   . Dysrhythmia    "tachycardia" (12/11/2011) new onset afib 10/15/14 EKG  . E coli bacteremia 06/18/2011  .  Elevated LDL cholesterol level 12/2018  . ESRD (end stage renal disease) ([place]) 06/12/2011  . Fibromyalgia   . Gastroparesis   . Gastroparesis   . GERD (gastroesophageal reflux disease)   . Glaucoma    right eye  . Gout   . H/O carpal tunnel syndrome   . Headache 10/2019  . Headache(784.0)    "not often anymore" (12/11/2011)  . Herpes genitalia 1994  . History of blood transfusion    "more than a few times" (12/11/2011)  . History of stomach ulcers   . Hypotension   . Iron deficiency anemia   . New onset a-fib ([place])    10/15/14 EKG  .  Osteopenia   . Peripheral neuropathy 11/2018  . Pressure ulcer of sacral region, stage 1 07/2019  . Seizures ([place]) 1994   "post transplant; only have had that one" (12/11/2011)  . Spinal stenosis in cervical region   . Stroke [facility])     left basal ganglia lacunar infarct; Right frontal lobe lacunar infarct.  . Stroke [facility]) ~ 1999; 2001   "briefly lost my vision; lost my right eye" (12/11/2011)  . Vitamin D deficiency 12/2018    Current Outpatient Medications  Medication Sig Dispense Refill  . allopurinol (ZYLOPRIM) 100 MG tablet Take 1 tablet (100 mg total) by mouth daily. 90 tablet 3  . aspirin EC 81 MG tablet Take 81 mg by mouth daily.     . calcium acetate, Phos Binder, (PHOSLYRA) 667 MG/5ML SOLN Take by mouth 3 (three) times daily with meals.    . Colchicine 0.6 MG CAPS Take 0.6 mg by mouth as needed.    . diphenhydrAMINE (BENADRYL) 25 mg capsule Take 1 capsule (25 mg total) by mouth every 8 (eight) hours as needed. 30 capsule 6  . famotidine (PEPCID) 20 MG tablet Take 1 tablet (20 mg total) by mouth 2 (two) times daily. 60 tablet 11  . gabapentin (NEURONTIN) 100 MG capsule Take 100 mg by mouth 2 (two) times daily.     [name] latanoprost (XALATAN) 0.005 % ophthalmic solution Place 1 drop into the left eye at bedtime.  3  . lidocaine (LIDODERM) 5 % Place 1 patch onto the skin daily as needed (for pain- remove old patch first).     . Methoxy PEG-Epoetin Beta (MIRCERA IJ) Mircera    . midodrine (PROAMATINE) 10 MG tablet Take 1.5 tablets (15 mg total) by mouth 3 (three) times daily. 135 tablet 1  . ondansetron (ZOFRAN-ODT) 4 MG disintegrating tablet Take 1 tablet (4 mg total) by mouth every 8 (eight) hours as needed for nausea or vomiting. 20 tablet 11  . oxyCODONE-acetaminophen (PERCOCET) 10-325 MG tablet Take 1 tablet by mouth 4 (four) times daily as needed.     . predniSONE (DELTASONE) 5 MG tablet Take 5 mg by mouth daily with breakfast.    . tacrolimus (PROGRAF) 1 MG capsule Take 2-3 mg by  mouth See admin instructions. Take 3 mg by mouth in the morning and 2 mg at bedtime    . Vitamin D, Ergocalciferol, (DRISDOL) 1.25 MG (50000 UNIT) CAPS capsule TAKE 1 CAPSULE (50,000 UNITS TOTAL) BY MOUTH EVERY 7 (SEVEN) DAYS. 5 capsule 6  . zolpidem (AMBIEN) 10 MG tablet Take 1 tablet (10 mg total) by mouth at bedtime as needed for sleep. 10 tablet 0   No current facility-administered medications for this encounter.    Allergies  Allergen Reactions  . Levofloxacin Itching and Rash  . Tape Other (See Comments)    "  Certain surgical tapes peel off my skin"     Social History   Socioeconomic History  . Marital status: Single    Spouse name: Not on file  . Number of children: 0  . Years of education: some college  . Highest education level: Some college, no degree  Occupational History  . Occupation: disabled    Fish farm manager: UNEMPLOYED  Tobacco Use  . Smoking status: Current Every Day Smoker    Packs/day: 0.33    Years: 35.00    Pack years: 11.55    Types: Cigarettes  . Smokeless tobacco: Never Used  . Tobacco comment: has patches but not currently using - plan to start using first of January 2022  Vaping Use  . Vaping Use: Never used  Substance and Sexual Activity  . Alcohol use: Yes    Comment: rare if out to dinner  . Drug use: Yes    Frequency: 1.0 times per week    Types: Marijuana    Comment: smoking once a day when in severe pain  . Sexual activity: Yes    Partners: Male    Birth control/protection: None  Other Topics Concern  . Not on file  Social History Narrative   Right-handed.   Four cups caffeine per day.   Lives alone.   Social Determinants of Health   Financial Resource Strain: Low Risk   . Difficulty of Paying Living Expenses: Not hard at all  Food Insecurity: No Food Insecurity  . Worried About Charity fundraiser in the Last Year: Never true  . Ran Out of Food in the Last Year: Never true  Transportation Needs: No Transportation Needs  . Lack of  Transportation (Medical): No  . Lack of Transportation (Non-Medical): No  Physical Activity: Insufficiently Active  . Days of Exercise per Week: 3 days  . Minutes of Exercise per Session: 10 min  Stress: Stress Concern Present  . Feeling of Stress : To some extent  Social Connections: Moderately Isolated  . Frequency of Communication with Friends and Family: More than three times a week  . Frequency of Social Gatherings with Friends and Family: More than three times a week  . Attends Religious Services: More than 4 times per year  . Active Member of Clubs or Organizations: No  . Attends Archivist Meetings: Never  . Marital Status: Never married  Intimate Partner Violence: Not At Risk  . Fear of Current or Ex-Partner: No  . Emotionally Abused: No  . Physically Abused: No  . Sexually Abused: No     Family History  Problem Relation Age of Onset  . Glaucoma Mother   . Pancreatic cancer Father   . Multiple sclerosis Brother   . Hypertension Maternal Grandmother   . Breast cancer Neg Hx     Vitals:   03/06/20 1059  BP: 100/60  Pulse: 80  SpO2: 99%  Weight: 49.4 kg (108 lb 12.8 oz)     Wt Readings from Last 3 Encounters:  03/06/20 49.4 kg (108 lb 12.8 oz)  12/13/19 46.3 kg (102 lb)  12/12/19 47.6 kg (105 lb)   PHYSICAL EXAM: General:  Frail. NAD. No resp difficulty HEENT: R eye opacified Neck: Supple. No JVD. Carotids 2+ bilat; no bruits. No lymphadenopathy or thryomegaly appreciated. Cor: PMI nondisplaced. Regular rate & rhythm. No rubs, gallops, +TR. Lungs: Clear, diminished in bases Abdomen: Soft, nontender, nondistended. No hepatosplenomegaly. No bruits or masses. Good bowel sounds. Extremities: No cyanosis, clubbing, rash, trace LE edema.  HD access R groin. Neuro: alert & oriented x 3, cranial nerves grossly intact. Moves all 4 extremities w/o difficulty. Affect pleasant.  ECG: NSR 74 bpm, qtc 459 (personally reviewed).  ASSESSMENT & PLAN:  1.  Palpitations - Place Zio 14 days. - Will arrange Lexiscan to further quantify known non-obstructive CAD, that could be contributing to symptoms (personally discussed with Dr. Haroldine Laws). - PCP has referred to psychiatry for anxiety. - Patient requesting xanax refill, explained we do not write for benzos for fast HR, need to find out root problem.  2. Chronic diatolic heart failure with preserved ejection fraction ([place]) - Echo 12/20 EF 60% RV normal. Grade 3 DD - NYHA II-III, volume little elevated today, due to HD tomorrow - Needs repeat echo soon.  3. Hypotension - Continue midodrine 15 mg tid. - Continue compression stockings as tolerated.  4. Tobacco use - Smoking several cigarettes per day. Encouraged her to quit. - No change.  5. ESRD - s/p previous failed renal transplant. - She is T/TH/Sat HD. - Has not started PD, says it is a last resort.  Place Zio patch and schedule Lexiscan, follow back with Dr. Haroldine Laws in 1 month.  Rafael Bihari, [place] 03/06/20 3:33 PM   Patient seen and examined with the above-signed Advanced Practice Provider and/or Housestaff. I personally reviewed laboratory data, imaging studies and relevant notes. I independently examined the patient and formulated the important aspects of the plan. I have edited the note to reflect any of my changes or salient points. I have personally discussed the plan with the patient and/or family.  She has frequent episodes of palpitations and tachycardia when coming of HD. Suspect this is likely demand sinus tach given consistency and timing but certainly at risk for other arrhythmias, Agree with Zio monitoring. Given chest pain, CRFs and coronary calcium on chest CT will also proceed with Lexiscan Myoview.   General:  Sitting in chair No resp difficulty HEENT: normal Neck: supple. JVP 9-10. Carotids 2+ bilat; no bruits. No lymphadenopathy or thryomegaly appreciated. Cor: PMI nondisplaced. Regular rate & rhythm.  No rubs, gallops or murmurs. Lungs: clear Abdomen: soft, nontender, nondistended. No hepatosplenomegaly. No bruits or masses. Good bowel sounds. Extremities: no cyanosis, clubbing, rash, 2+ edema +AVF Neuro: alert & orientedx3, cranial nerves grossly intact. moves all 4 extremities w/o difficulty. Affect pleasant\  As above, proceed with Zio monitoring and stress testing.   Glori Bickers, MD  10:28 PM

## 2020-03-06 ENCOUNTER — Other Ambulatory Visit: Payer: Self-pay | Admitting: Family Medicine

## 2020-03-06 ENCOUNTER — Other Ambulatory Visit (HOSPITAL_COMMUNITY): Payer: Self-pay | Admitting: Internal Medicine

## 2020-03-06 ENCOUNTER — Ambulatory Visit (HOSPITAL_COMMUNITY)
Admission: RE | Admit: 2020-03-06 | Discharge: 2020-03-06 | Disposition: A | Discharge status: Home / Self Care | Payer: Medicare Other | Source: Ambulatory Visit | Attending: Family Medicine | Admitting: Family Medicine

## 2020-03-06 ENCOUNTER — Telehealth: Payer: Self-pay | Admitting: Family Medicine

## 2020-03-06 ENCOUNTER — Other Ambulatory Visit: Payer: Self-pay

## 2020-03-06 ENCOUNTER — Encounter (HOSPITAL_COMMUNITY): Payer: Self-pay

## 2020-03-06 VITALS — BP 100/60 | HR 80 | Wt 108.8 lb

## 2020-03-06 DIAGNOSIS — Z7952 Long term (current) use of systemic steroids: Secondary | ICD-10-CM | POA: Insufficient documentation

## 2020-03-06 DIAGNOSIS — R002 Palpitations: Secondary | ICD-10-CM

## 2020-03-06 DIAGNOSIS — R0789 Other chest pain: Secondary | ICD-10-CM

## 2020-03-06 DIAGNOSIS — N186 End stage renal disease: Secondary | ICD-10-CM | POA: Insufficient documentation

## 2020-03-06 DIAGNOSIS — K3184 Gastroparesis: Secondary | ICD-10-CM | POA: Insufficient documentation

## 2020-03-06 DIAGNOSIS — I959 Hypotension, unspecified: Secondary | ICD-10-CM | POA: Diagnosis not present

## 2020-03-06 DIAGNOSIS — I5032 Chronic diastolic (congestive) heart failure: Secondary | ICD-10-CM | POA: Insufficient documentation

## 2020-03-06 DIAGNOSIS — R072 Precordial pain: Secondary | ICD-10-CM | POA: Diagnosis not present

## 2020-03-06 DIAGNOSIS — F17210 Nicotine dependence, cigarettes, uncomplicated: Secondary | ICD-10-CM | POA: Diagnosis not present

## 2020-03-06 DIAGNOSIS — Z940 Kidney transplant status: Secondary | ICD-10-CM | POA: Diagnosis not present

## 2020-03-06 DIAGNOSIS — I4891 Unspecified atrial fibrillation: Secondary | ICD-10-CM | POA: Insufficient documentation

## 2020-03-06 DIAGNOSIS — Z7982 Long term (current) use of aspirin: Secondary | ICD-10-CM | POA: Insufficient documentation

## 2020-03-06 DIAGNOSIS — I132 Hypertensive heart and chronic kidney disease with heart failure and with stage 5 chronic kidney disease, or end stage renal disease: Secondary | ICD-10-CM | POA: Insufficient documentation

## 2020-03-06 DIAGNOSIS — M25562 Pain in left knee: Secondary | ICD-10-CM

## 2020-03-06 DIAGNOSIS — M25561 Pain in right knee: Secondary | ICD-10-CM

## 2020-03-06 DIAGNOSIS — Z8673 Personal history of transient ischemic attack (TIA), and cerebral infarction without residual deficits: Secondary | ICD-10-CM | POA: Insufficient documentation

## 2020-03-06 DIAGNOSIS — Z79899 Other long term (current) drug therapy: Secondary | ICD-10-CM | POA: Diagnosis not present

## 2020-03-06 DIAGNOSIS — I2510 Atherosclerotic heart disease of native coronary artery without angina pectoris: Secondary | ICD-10-CM | POA: Diagnosis not present

## 2020-03-06 DIAGNOSIS — Z992 Dependence on renal dialysis: Secondary | ICD-10-CM | POA: Diagnosis not present

## 2020-03-06 DIAGNOSIS — G8929 Other chronic pain: Secondary | ICD-10-CM

## 2020-03-06 NOTE — Telephone Encounter (Signed)
Attempted to contact patient today. Referral to Orthopedics for further assessment and possible treatment for chronic bilateral knee pain placed today.

## 2020-03-06 NOTE — Patient Instructions (Signed)
It was great to see you today! No medication changes are needed at this time.  Your provider has recommended that  you wear a Zio Patch for 14 days.  This monitor will record your heart rhythm for our review.  IF you have any symptoms while wearing the monitor please press the button.  If you have any issues with the patch or you notice a red or orange light on it please call the company at [phone].  Once you remove the patch please mail it back to the company as soon as possible so we can get the results.   Your physician has requested that you have a lexiscan myoview. For further information please visit [email or web address]. Please follow instruction sheet, as given.   Do the following things EVERYDAY: 1) Weigh yourself in the morning before breakfast. Write it down and keep it in a log. 2) Take your medicines as prescribed 3) Eat low salt foods-Limit salt (sodium) to 2000 mg per day.  4) Stay as active as you can everyday 5) Limit all fluids for the day to less than 2 liters  If you have any questions or concerns before your next appointment please send [name] a message through [place] or call our office at [phone].    TO LEAVE A MESSAGE FOR THE NURSE SELECT OPTION 2, PLEASE LEAVE A MESSAGE INCLUDING: . YOUR NAME . DATE OF BIRTH . CALL BACK NUMBER . REASON FOR CALL**this is important as we prioritize the call backs  [place] AS LONG AS YOU CALL BEFORE 4:00 PM  At the [facility], you and your health needs are our priority. As part of our continuing mission to provide you with exceptional heart care, we have created designated Provider Care Teams. These Care Teams include your primary Cardiologist (physician) and Advanced Practice Providers (APPs- Physician Assistants and Nurse Practitioners) who all work together to provide you with the care you need, when you need it.   You may see any of the following providers on your  designated Care Team at your next follow up: [name] Dr Glori Bickers . Dr Loralie Champagne . Darrick Grinder, NP . Lyda Jester, [place] . Audry Riles, PharmD   Please be sure to bring in all your medications bottles to every appointment.

## 2020-03-13 ENCOUNTER — Ambulatory Visit (INDEPENDENT_AMBULATORY_CARE_PROVIDER_SITE_OTHER): Payer: Medicare Other | Admitting: Orthopaedic Surgery

## 2020-03-13 DIAGNOSIS — M25561 Pain in right knee: Secondary | ICD-10-CM | POA: Diagnosis not present

## 2020-03-13 DIAGNOSIS — M25562 Pain in left knee: Secondary | ICD-10-CM | POA: Diagnosis not present

## 2020-03-13 DIAGNOSIS — I2510 Atherosclerotic heart disease of native coronary artery without angina pectoris: Secondary | ICD-10-CM | POA: Diagnosis not present

## 2020-03-13 DIAGNOSIS — G8929 Other chronic pain: Secondary | ICD-10-CM

## 2020-03-13 MED ORDER — DICLOFENAC SODIUM 1 % EX GEL: 2.0000 g | Freq: Four times a day (QID) | CUTANEOUS | 5 refills | Status: DC

## 2020-03-13 NOTE — Progress Notes (Signed)
Office Visit Note   Patient: Tina Watkins           Date of Birth: 1965-05-25           MRN: [id number] Visit Date: 03/13/2020              Requested by: Tina Watkins, Tina Watkins,  [place] [id number] PCP: Tina Glatter, FNP   Assessment & Plan: Visit Diagnoses:  1. 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  She has a fairly large lesion in the medial femoral condyle in the weightbearing portion.  We discussed nonsurgical treatment such as injections and Voltaren gel knee braces.  She would like to hold off on injection for now after considering pros and cons.  Her right knee MRI is pending but I suspect that that a similar process is occurring there.  For now we will see her back as needed.  Follow-Up Instructions: Return if symptoms worsen or fail to improve.   Orders:  No orders of the defined types were placed in this encounter.  Meds ordered this encounter  Medications  . diclofenac Sodium (VOLTAREN) 1 % GEL    Sig: Apply 2 g topically 4 (four) times daily.    Dispense:  150 g    Refill:  5      Procedures: No procedures performed   Clinical Data: No additional findings.   Subjective: Chief Complaint  Patient presents with  . Left Knee - Pain  . Right Knee - Pain    Tina Watkins is a 55 year old female here for evaluation of bilateral knee pain right greater than left.  She is on dialysis and was previously a kidney transplant recipient but unfortunately she lost her kidney about a year ago.  She states that she has constant throbbing and aching throughout both her knees with the right one being worse.  Denies any mechanical symptoms.  She is in a chronic pain clinic and takes oxycodone 10 mg a day.  She had some injections back in the 90s which she recalls that has been helpful.  She denies any numbness and tingling.  Denies any  recent injuries.   Review of Systems  Constitutional: Negative.   HENT: Negative.   Eyes: Negative.   Respiratory: Negative.   Cardiovascular: Negative.   Endocrine: Negative.   Musculoskeletal: Negative.   Neurological: Negative.   Hematological: Negative.   Psychiatric/Behavioral: Negative.   All other systems reviewed and are negative.    Objective: Vital Signs: There were no vitals taken for this visit.  Physical Exam Vitals and nursing note reviewed.  Constitutional:      Appearance: She is well-developed and well-nourished.  HENT:     Head: Normocephalic and atraumatic.  Eyes:     Extraocular Movements: EOM normal.  Pulmonary:     Effort: Pulmonary effort is normal.  Abdominal:     Palpations: Abdomen is soft.  Musculoskeletal:     Cervical back: Neck supple.  Skin:    General: Skin is warm.     Capillary Refill: Capillary refill takes less than 2 seconds.  Neurological:     Mental Status: She is alert and oriented to person, place, and time.  Psychiatric:        Mood and Affect: Mood and affect normal.        Behavior: Behavior normal.  Thought Content: Thought content normal.        Judgment: Judgment normal.     Ortho Exam Bilateral knees show no joint effusion.  Mild crepitus with range of motion and moderate pain and discomfort.  Collaterals and cruciates are stable.  Slight bony tenderness. Specialty Comments:  No specialty comments available.  Imaging: No results found.   PMFS History: Patient Active Problem List   Diagnosis Date Noted  . Chronic pain of both knees 03/13/2020  . Anemia 09/02/2019  . Decubitus ulcer of sacral region, stage 1 08/18/2019  . HCAP (healthcare-associated pneumonia) 08/05/2019  . Pressure injury of skin 07/24/2019  . Chest congestion 07/24/2019  . Itching 07/24/2019  . Weakness 06/13/2019  . Pneumonia due to COVID-19 virus 05/09/2019  . Hypotension 05/09/2019  . Symptomatic anemia 05/09/2019  . CHF  (congestive heart failure) ([place]) 05/09/2019  . Swollen abdomen 01/04/2019  . DDD (degenerative disc disease), cervical 12/06/2018  . Neuropathy 12/06/2018  . Paresthesia 10/11/2018  . Neck pain 10/11/2018  . Tobacco abuse 11/20/2017  . Right leg swelling 11/20/2017  . CKD (chronic kidney disease), stage III ([place]) 11/20/2017  . Right leg pain 11/20/2017  . Stroke (cerebrum) ([place]) 11/20/2017  . GERD (gastroesophageal reflux disease) 11/20/2017  . Acute venous embolism and thrombosis of deep vessels of proximal lower extremity, right ([place]) 11/20/2017  . 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 Thrush, oral   . AKI (acute kidney injury) ([place])   . Multifocal pneumonia 08/09/2017  . Chest pain 09/29/2015  . Cervical stenosis of spinal canal 01/08/2015  . Urinary tract infectious disease   . Muscle spasms of neck 06/15/2014  . Bleeding hemorrhoid 06/15/2014  . Anemia in chronic kidney disease 06/15/2014  . Pyelonephritis, acute 06/13/2014  . Sepsis ([place]) 06/13/2014  . History of kidney transplant   . Chronic pain syndrome 04/09/2012  . Dehydration, mild 04/09/2012  . Gout attack 06/23/2011  . Herpes infection 06/23/2011  . Anxiety 06/23/2011  . E coli bacteremia 06/18/2011  . ESRD (end stage renal disease) ([place]) 06/12/2011  . UTI (lower urinary tract infection) 06/12/2011  . Bacteremia due to Gram-negative bacteria 05/23/2011  . History of renal transplantation 05/22/2011  . Septic shock(785.52) 05/22/2011  . Acute on chronic kidney failure ([place]) 05/22/2011  . Gastroparesis 04/24/2008  . WEIGHT LOSS 08/24/2007  . NAUSEA AND VOMITING 08/24/2007   Past Medical History:  Diagnosis Date  . Bacteremia due to Gram-negative bacteria 05/23/2011  . Blind    right eye  . Blind right eye   . CHF (congestive heart failure) ([place])   . Chronic lower back pain   . Complication of anesthesia    "woke up during OR; I have an extremely high tolerance"  (12/11/2011) 1 procedure was graft; the other procedures were procedures that are typically done with sedation.  . DDD (degenerative disc disease), cervical   . Depression   . Dysrhythmia    "tachycardia" (12/11/2011) new onset afib 10/15/14 EKG  . E coli bacteremia 06/18/2011  . Elevated LDL cholesterol level 12/2018  . ESRD (end stage renal disease) ([place]) 06/12/2011  . Fibromyalgia   . Gastroparesis   . Gastroparesis   . GERD (gastroesophageal reflux disease)   . Glaucoma    right eye  . Gout   . H/O carpal tunnel syndrome   . Headache 10/2019  . Headache(784.0)    "not often anymore" (12/11/2011)  . Herpes genitalia 1994  .  History of blood transfusion    "more than a few times" (12/11/2011)  . History of stomach ulcers   . Hypotension   . Iron deficiency anemia   . New onset a-fib ([place])    10/15/14 EKG  . Osteopenia   . Peripheral neuropathy 11/2018  . Pressure ulcer of sacral region, stage 1 07/2019  . Seizures ([place]) 1994   "post transplant; only have had that one" (12/11/2011)  . Spinal stenosis in cervical region   . Stroke [facility])     left basal ganglia lacunar infarct; Right frontal lobe lacunar infarct.  . Stroke [facility]) ~ 1999; 2001   "briefly lost my vision; lost my right eye" (12/11/2011)  . Vitamin D deficiency 12/2018    Family History  Problem Relation Age of Onset  . Glaucoma Mother   . Pancreatic cancer Father   . Multiple sclerosis Brother   . Hypertension Maternal Grandmother   . Breast cancer Neg Hx     Past Surgical History:  Procedure Laterality Date  . ANTERIOR CERVICAL DECOMP/DISCECTOMY FUSION N/A 01/08/2015   Procedure: Anterior Cervical Three-Four/Four-Five Decompression/Diskectomy/Fusion;  Surgeon: Leeroy Cha, MD;  Location: [facility];  Service: Neurosurgery;  Laterality: N/A;  C3-4 C4-5 Anterior cervical decompression/diskectomy/fusion  . APPENDECTOMY  ~ 2004  . CATARACT EXTRACTION     right eye  . COLONOSCOPY    . ENUCLEATION  2001    "right"  . ESOPHAGOGASTRODUODENOSCOPY (EGD) WITH PROPOFOL N/A 04/21/2012   Procedure: ESOPHAGOGASTRODUODENOSCOPY (EGD) WITH PROPOFOL;  Surgeon: Milus Banister, MD;  Location: [facility];  Service: Endoscopy;  Laterality: N/A;  . 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 IR FLUORO GUIDE CV LINE RIGHT  07/11/2019  . IR FLUORO GUIDE CV LINE RIGHT  10/20/2019  . IR RADIOLOGY PERIPHERAL GUIDED IV START  07/11/2019  . IR US GUIDE VASC ACCESS RIGHT  07/11/2019  . IR US GUIDE VASC ACCESS RIGHT  07/11/2019  . IR US GUIDE VASC ACCESS RIGHT  07/11/2019  . [place]; 1999; 2005   "right"  . MULTIPLE TOOTH EXTRACTIONS    . RIGHT HEART CATH N/A 03/23/2019   Procedure: RIGHT HEART CATH;  Surgeon: Jolaine Artist, MD;  Location: [facility];  Service: Cardiovascular;  Laterality: N/A;  . TONSILLECTOMY    . [place]?; 1994; 2005   Social History   Occupational History  . Occupation: disabled    Fish farm manager: UNEMPLOYED  Tobacco Use  . Smoking status: Current Every Day Smoker    Packs/day: 0.33    Years: 35.00    Pack years: 11.55    Types: Cigarettes  . Smokeless tobacco: Never Used  . Tobacco comment: has patches but not currently using - plan to start using first of January 2022  Vaping Use  . Vaping Use: Never used  Substance and Sexual Activity  . Alcohol use: Yes    Comment: rare if out to dinner  . Drug use: Yes    Frequency: 1.0 times per week    Types: Marijuana    Comment: smoking once a day when in severe pain  . Sexual activity: Yes    Partners: Male    Birth control/protection: None

## 2020-03-19 ENCOUNTER — Telehealth: Payer: Self-pay | Admitting: Family Medicine

## 2020-03-19 DIAGNOSIS — M25561 Pain in right knee: Secondary | ICD-10-CM

## 2020-03-19 DIAGNOSIS — Z992 Dependence on renal dialysis: Secondary | ICD-10-CM

## 2020-03-19 DIAGNOSIS — I509 Heart failure, unspecified: Secondary | ICD-10-CM

## 2020-03-19 NOTE — Telephone Encounter (Signed)
Spoke with patient and she now has medicaid and would like to proceed with getting a home health aide.  Order placed and sent to [facility] [address] [place], [place] [address] (Phone) [phone]

## 2020-03-19 NOTE — Telephone Encounter (Signed)
Pt called states agency sent prescription [facility]. Genine at agency said call her at [phone]. Do not send prescription to Medicare,  Please send to [place] to pay for it.

## 2020-03-20 ENCOUNTER — Other Ambulatory Visit: Payer: Self-pay

## 2020-03-20 ENCOUNTER — Other Ambulatory Visit: Payer: Self-pay | Admitting: Obstetrics and Gynecology

## 2020-03-20 NOTE — Patient Outreach (Signed)
Medicaid Managed Care   Nurse Care Manager Note  03/20/2020 Name:  Tina Watkins MRN:  [id number] DOB:  1965/07/03  Tina Watkins is an 55 y.o. year old female who is a primary patient of [name], FNP.  The [facility] Managed Care Coordination team was consulted for assistance with:    chronic healthcare management needs.  Tina Watkins was given information about Medicaid Managed Care Coordination team services today. Tina Watkins agreed to services and verbal consent obtained.  Engaged with patient by telephone for follow up visit in response to provider referral for case management and/or care coordination services.   Assessments/Interventions:  Review of past medical history, allergies, medications, health status, including review of consultants reports, laboratory and other test data, was performed as part of comprehensive evaluation and provision of chronic care management services.  SDOH (Social Determinants of Health) assessments and interventions performed:   Care Plan  Allergies  Allergen Reactions  . Levofloxacin Itching and Rash  . Tape Other (See Comments)    "Certain surgical tapes peel off my skin"    Medications Reviewed Today    Reviewed by Gayla Medicus, RN (Registered Nurse) on 03/20/20 at [place] List Status: <None>  Medication Order Taking? Sig Documenting Provider Last Dose Status Informant  allopurinol (ZYLOPRIM) 100 MG tablet [id number] No Take 1 tablet (100 mg total) by mouth daily. [name], FNP Taking Active   aspirin EC 81 MG tablet [id number] No Take 81 mg by mouth daily.  [provider] Taking Active Self  calcium acetate, Phos Binder, (PHOSLYRA) 667 MG/5ML SOLN [id number] No Take by mouth 3 (three) times daily with meals. [provider] Taking Active   Colchicine 0.6 MG CAPS [id number] No Take 0.6 mg by mouth as needed. [provider] Taking Active   diclofenac Sodium (VOLTAREN)  1 % GEL [id number]  Apply 2 g topically 4 (four) times daily. Leandrew Koyanagi, MD  Active   diphenhydrAMINE (BENADRYL) 25 mg capsule [id number] No Take 1 capsule (25 mg total) by mouth every 8 (eight) hours as needed. [name], FNP Taking Active   famotidine (PEPCID) 20 MG tablet [id number] No Take 1 tablet (20 mg total) by mouth 2 (two) times daily. [name], FNP Taking Active Self  gabapentin (NEURONTIN) 100 MG capsule [id number] No Take 100 mg by mouth 2 (two) times daily.  [provider] Taking Active Self  latanoprost (XALATAN) 0.005 % ophthalmic solution [id number] No Place 1 drop into the left eye at bedtime. [provider] Taking Active Self           Med Note [name] Apr 17, 2019  6:42 PM)    lidocaine (LIDODERM) 5 % [id number] No Place 1 patch onto the skin daily as needed (for pain- remove old patch first).  [provider] Taking Active Self  Methoxy PEG-Epoetin Beta (MIRCERA IJ) [id number] No Mircera [provider] Taking Active   midodrine (PROAMATINE) 10 MG tablet [id number] No Take 1.5 tablets (15 mg total) by mouth 3 (three) times daily. Bensimhon, Shaune Pascal, MD Taking Active   ondansetron (ZOFRAN-ODT) 4 MG disintegrating tablet [id number] No Take 1 tablet (4 mg total) by mouth every 8 (eight) hours as needed for nausea or vomiting. [name], FNP Taking Active Self  oxyCODONE-acetaminophen (PERCOCET) 10-325 MG tablet [id number] No Take 1 tablet by mouth 4 (four) times daily as needed.  [provider] Taking Active  predniSONE (DELTASONE) 5 MG tablet [id number] No Take 5 mg by mouth daily with breakfast. [provider] Taking Active   tacrolimus (PROGRAF) 1 MG capsule [id number] No Take 2-3 mg by mouth See admin instructions. Take 3 mg by mouth in the morning and 2 mg at bedtime [name], MD Taking Active Self           Med Note [name] Apr 17, 2019  6:42 PM)    Vitamin D,  Ergocalciferol, (DRISDOL) 1.25 MG (50000 UNIT) CAPS capsule [id number] No TAKE 1 CAPSULE (50,000 UNITS TOTAL) BY MOUTH EVERY 7 (SEVEN) DAYS. [name], FNP Taking Active   zolpidem (AMBIEN) 10 MG tablet [id number] No Take 1 tablet (10 mg total) by mouth at bedtime as needed for sleep. [name], DO Taking Active Self          Patient Active Problem List   Diagnosis Date Noted  . Chronic pain of both knees 03/13/2020  . Anemia 09/02/2019  . Decubitus ulcer of sacral region, stage 1 08/18/2019  . HCAP (healthcare-associated pneumonia) 08/05/2019  . Pressure injury of skin 07/24/2019  . Chest congestion 07/24/2019  . Itching 07/24/2019  . Weakness 06/13/2019  . Pneumonia due to COVID-19 virus 05/09/2019  . Hypotension 05/09/2019  . Symptomatic anemia 05/09/2019  . CHF (congestive heart failure) ([place]) 05/09/2019  . Swollen abdomen 01/04/2019  . DDD (degenerative disc disease), cervical 12/06/2018  . Neuropathy 12/06/2018  . Paresthesia 10/11/2018  . Neck pain 10/11/2018  . Tobacco abuse 11/20/2017  . Right leg swelling 11/20/2017  . CKD (chronic kidney disease), stage III ([place]) 11/20/2017  . Right leg pain 11/20/2017  . Stroke (cerebrum) ([place]) 11/20/2017  . GERD (gastroesophageal reflux disease) 11/20/2017  . Acute venous embolism and thrombosis of deep vessels of proximal lower extremity, right ([place]) 11/20/2017  . 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 Thrush, oral   . AKI (acute kidney injury) ([place])   . Multifocal pneumonia 08/09/2017  . Chest pain 09/29/2015  . Cervical stenosis of spinal canal 01/08/2015  . Urinary tract infectious disease   . Muscle spasms of neck 06/15/2014  . Bleeding hemorrhoid 06/15/2014  . Anemia in chronic kidney disease 06/15/2014  . Pyelonephritis, acute 06/13/2014  . Sepsis ([place]) 06/13/2014  . History of kidney transplant   . Chronic pain syndrome 04/09/2012  . Dehydration, mild 04/09/2012   . Gout attack 06/23/2011  . Herpes infection 06/23/2011  . Anxiety 06/23/2011  . E coli bacteremia 06/18/2011  . ESRD (end stage renal disease) ([place]) 06/12/2011  . UTI (lower urinary tract infection) 06/12/2011  . Bacteremia due to Gram-negative bacteria 05/23/2011  . History of renal transplantation 05/22/2011  . Septic shock(785.52) 05/22/2011  . Acute on chronic kidney failure ([place]) 05/22/2011  . Gastroparesis 04/24/2008  . WEIGHT LOSS 08/24/2007  . NAUSEA AND VOMITING 08/24/2007    Conditions to be addressed/monitored per PCP order:  chronic healthcare management needs-ESRD, CHF, hypotension, depression-anxiety    Care Plan : General Plan of Care (Adult)  Updates made by Gayla Medicus, RN since 03/20/2020 12:00 AM    Problem: Health Promotion or Disease Self-Management (General Plan of Care)     Long-Range Goal: Self-Management Plan Developed   Start Date: 01/24/2020  Expected End Date: 06/17/2020  This Visit's Progress: Not on track  Priority: High  Note:   Current Barriers:  . Chronic Disease Management support and education  needs.  Patient awaiting to hear from her PCP regarding approval of home health aide with [facility].   Nurse Case Manager Clinical Goal(s):  [name] Over the next 90 days, patient will attend all scheduled medical appointments: . Over the next 90 days, patient will work with CM team pharmacist to review medications.  Interventions:  . Inter-disciplinary care team collaboration (see longitudinal plan of care) . Advised patient to follow up with PCP regarding approval of home health aide with [facility]. . Reviewed medications with patient. Tina Watkins with Pharmacy regarding medications. . Discussed plans with patient for ongoing care management follow up and provided patient with direct contact information for care management team . Reviewed scheduled/upcoming provider appointments. . Care Guide referral for  Pharmacist.  Patient Goals/Self-Care Activities Over the next 90 days, patient will:  -Self administers medications as prescribed Attends all scheduled provider appointments Calls pharmacy for medication refills Calls provider office for new concerns or questions  Follow Up Plan: The Managed Medicaid care management team will reach out to the patient again over the next 30 days.  The patient has been provided with contact information for the Managed Medicaid care management team and has been advised to call with any health related questions or concerns.    Evidence-based guidance:    Review biopsychosocial determinants of health screens.   Review need for preventive screening based on age, sex, family history and health history.   Determine level of modifiable health risk.   Assess level of child and caregiver activation, level of readiness, importance and confidence to make changes.   Discuss identified risks.   Identify areas where behavior change may lead to improved health.   Promote healthy lifestyle.   Evoke change talk using open-ended questions, pros and cons, as well as looking forward.   Identify and manage conditions or preconditions to reduce health risk.   Implement additional goals and interventions based on identified risk factors.   Include child's contributions based on age and understanding to self-management plan.   Support child and caregiver active participation in decision-making and self-management plan.              Follow Up:  Patient agrees to Care Plan and Follow-up.  Plan: The Managed Medicaid care management team will reach out to the patient again over the next 30 days. and The patient has been provided with contact information for the Managed Medicaid care management team and has been advised to call with any health related questions or concerns.  Date/time of next scheduled RN care management/care coordination outreach:  04/17/20 at  1030.

## 2020-03-20 NOTE — Patient Instructions (Signed)
Hi [name], thank you for speaking with me today.  Tina Watkins was given information about Medicaid Managed Care team care coordination services as a part of their Healthy [facility] Medicaid benefit. Tina Watkins verbally consented to engagement with the [facility] Managed Care team.   For questions related to your Healthy [facility] health plan, please call: [phone] or visit the homepage here: [email or web address]  If you would like to schedule transportation through your Healthy [facility] plan, please call the following number at least 2 days in advance of your appointment: [phone]  [name] - following are the goals we discussed in your visit today:  Goals Addressed            This Visit's Progress   . Protect My Health       Timeframe:  Long-Range Goal Priority:  High Start Date:       01/24/20                      Expected End Date:      04/23/20                  - schedule recommended health tests   - schedule and keep appointment for annual check-up  Update 03/20/20:  patient attending appointments.  Has appointment today with therapist.       Patient verbalizes understanding of instructions provided today.   The Managed Medicaid care management team will reach out to the patient again over the next 30 days.  The patient has been provided with contact information for the Managed Medicaid care management team and has been advised to call with any health related questions or concerns.   Tina Raider RN, BSN [place]  [facility] - Managed Medicaid High Risk [phone].  Following is a copy of your plan of care:    Patient Care Plan: General Plan of Care (Adult)    Problem Identified: Health Promotion or Disease Self-Management (General Plan of Care)     Long-Range Goal: Self-Management Plan Developed   Start Date: 01/24/2020  Expected End Date: 06/17/2020  This  Visit's Progress: Not on track  Priority: High  Note:   Current Barriers:  . Chronic Disease Management support and education needs.  Patient awaiting to hear from her PCP regarding approval of home health aide with [facility].   Nurse Case Manager Clinical Goal(s):  [name] Over the next 90 days, patient will attend all scheduled medical appointments: . Over the next 90 days, patient will work with CM team pharmacist to review medications.  Interventions:  . Inter-disciplinary care team collaboration (see longitudinal plan of care) . Advised patient to follow up with PCP regarding approval of home health aide with [facility]. . Reviewed medications with patient. [name] with Pharmacy regarding medications. . Discussed plans with patient for ongoing care management follow up and provided patient with direct contact information for care management team . Reviewed scheduled/upcoming provider appointments. . Care Guide referral for Pharmacist.  Patient Goals/Self-Care Activities Over the next 90 days, patient will:  -Self administers medications as prescribed Attends all scheduled provider appointments Calls pharmacy for medication refills Calls provider office for new concerns or questions  Follow Up Plan: The Managed Medicaid care management team will reach out to the patient again over the next 30 days.  The patient has been provided with contact information for the Managed Medicaid care management team and has been advised  to call with any health related questions or concerns.      Evidence-based guidance:    Review biopsychosocial determinants of health screens.   Review need for preventive screening based on age, sex, family history and health history.   Determine level of modifiable health risk.   Assess level of child and caregiver activation, level of readiness, importance and confidence to make changes.   Discuss identified risks.   Identify  areas where behavior change may lead to improved health.   Promote healthy lifestyle.   Evoke change talk using open-ended questions, pros and cons, as well as looking forward.   Identify and manage conditions or preconditions to reduce health risk.   Implement additional goals and interventions based on identified risk factors.   Include child's contributions based on age and understanding to self-management plan.   Support child and caregiver active participation in decision-making and self-management plan.

## 2020-03-20 NOTE — Telephone Encounter (Signed)
Pt states [facility] got the info. It was the incorrect form, they faxed the correct form for [facility] to [facility] yesterday 03/19/20.  Did we receive the form and have we filled it out and sent it to [facility]?  Pt ph [phone] if we do not have it call pt so she can get it sent to [facility] for completion.

## 2020-03-21 NOTE — Telephone Encounter (Signed)
Referral sent to [facility].

## 2020-03-22 ENCOUNTER — Other Ambulatory Visit (HOSPITAL_COMMUNITY): Payer: Self-pay

## 2020-03-22 ENCOUNTER — Other Ambulatory Visit: Payer: Self-pay

## 2020-03-22 MED ORDER — MIDODRINE HCL 10 MG PO TABS
15.0000 mg | ORAL_TABLET | Freq: Three times a day (TID) | ORAL | 3 refills | Status: DC
Start: 2020-03-22 — End: 2021-05-07

## 2020-03-22 NOTE — Progress Notes (Signed)
Request from [facility] health received and given to provider to sign.

## 2020-03-25 ENCOUNTER — Encounter: Payer: Self-pay | Admitting: Family Medicine

## 2020-03-26 ENCOUNTER — Telehealth: Payer: Self-pay | Admitting: Family Medicine

## 2020-03-26 NOTE — Telephone Encounter (Signed)
A [facility] [phone] called to say received faxed ref / order for [facility]START SERVICE in error.  Please fax to [facility] [phone] so they can do assessment for services.

## 2020-03-26 NOTE — Telephone Encounter (Signed)
Refaxed to [place].

## 2020-03-28 ENCOUNTER — Emergency Department (HOSPITAL_COMMUNITY): Payer: Medicare Other

## 2020-03-28 ENCOUNTER — Encounter (HOSPITAL_COMMUNITY): Payer: Self-pay

## 2020-03-28 ENCOUNTER — Other Ambulatory Visit: Payer: Self-pay

## 2020-03-28 ENCOUNTER — Observation Stay (HOSPITAL_COMMUNITY)
Admission: EM | Admit: 2020-03-28 | Discharge: 2020-03-29 | Disposition: A | Discharge status: Home / Self Care | Payer: Medicare Other | Attending: Student | Admitting: Student

## 2020-03-28 DIAGNOSIS — A419 Sepsis, unspecified organism: Secondary | ICD-10-CM | POA: Diagnosis present

## 2020-03-28 DIAGNOSIS — R531 Weakness: Secondary | ICD-10-CM | POA: Diagnosis not present

## 2020-03-28 DIAGNOSIS — I132 Hypertensive heart and chronic kidney disease with heart failure and with stage 5 chronic kidney disease, or end stage renal disease: Secondary | ICD-10-CM | POA: Insufficient documentation

## 2020-03-28 DIAGNOSIS — N186 End stage renal disease: Secondary | ICD-10-CM | POA: Insufficient documentation

## 2020-03-28 DIAGNOSIS — T8612 Kidney transplant failure: Secondary | ICD-10-CM | POA: Diagnosis not present

## 2020-03-28 DIAGNOSIS — Z8616 Personal history of COVID-19: Secondary | ICD-10-CM | POA: Diagnosis not present

## 2020-03-28 DIAGNOSIS — F17210 Nicotine dependence, cigarettes, uncomplicated: Secondary | ICD-10-CM | POA: Insufficient documentation

## 2020-03-28 DIAGNOSIS — Z992 Dependence on renal dialysis: Secondary | ICD-10-CM | POA: Diagnosis not present

## 2020-03-28 DIAGNOSIS — R6510 Systemic inflammatory response syndrome (SIRS) of non-infectious origin without acute organ dysfunction: Secondary | ICD-10-CM | POA: Diagnosis not present

## 2020-03-28 DIAGNOSIS — Z7982 Long term (current) use of aspirin: Secondary | ICD-10-CM | POA: Diagnosis not present

## 2020-03-28 DIAGNOSIS — I5032 Chronic diastolic (congestive) heart failure: Secondary | ICD-10-CM | POA: Diagnosis not present

## 2020-03-28 DIAGNOSIS — Z20822 Contact with and (suspected) exposure to covid-19: Secondary | ICD-10-CM | POA: Diagnosis not present

## 2020-03-28 DIAGNOSIS — Z940 Kidney transplant status: Secondary | ICD-10-CM | POA: Insufficient documentation

## 2020-03-28 DIAGNOSIS — I4892 Unspecified atrial flutter: Secondary | ICD-10-CM | POA: Diagnosis present

## 2020-03-28 DIAGNOSIS — R000 Tachycardia, unspecified: Secondary | ICD-10-CM | POA: Diagnosis not present

## 2020-03-28 LAB — CBC WITH DIFFERENTIAL/PLATELET
Abs Immature Granulocytes: 0 10*3/uL (ref 0.00–0.07)
Basophils Absolute: 0.1 10*3/uL (ref 0.0–0.1)
Basophils Relative: 1 %
Eosinophils Absolute: 0 10*3/uL (ref 0.0–0.5)
Eosinophils Relative: 0 %
HCT: 37.2 % (ref 36.0–46.0)
Hemoglobin: 11.9 g/dL — ABNORMAL LOW (ref 12.0–15.0)
Lymphocytes Relative: 6 %
Lymphs Abs: 0.5 10*3/uL — ABNORMAL LOW (ref 0.7–4.0)
MCH: 28.3 pg (ref 26.0–34.0)
MCHC: 32 g/dL (ref 30.0–36.0)
MCV: 88.6 fL (ref 80.0–100.0)
Monocytes Absolute: 0.7 10*3/uL (ref 0.1–1.0)
Monocytes Relative: 9 %
Neutro Abs: 6.6 10*3/uL (ref 1.7–7.7)
Neutrophils Relative %: 84 %
Platelets: 226 10*3/uL (ref 150–400)
RBC: 4.2 MIL/uL (ref 3.87–5.11)
RDW: 24 % — ABNORMAL HIGH (ref 11.5–15.5)
WBC: 7.8 10*3/uL (ref 4.0–10.5)
nRBC: 0 % (ref 0.0–0.2)
nRBC: 0 /100 WBC

## 2020-03-28 LAB — URINALYSIS, ROUTINE W REFLEX MICROSCOPIC
Bacteria, UA: NONE SEEN
Bilirubin Urine: NEGATIVE
Glucose, UA: NEGATIVE mg/dL
Hgb urine dipstick: NEGATIVE
Ketones, ur: 5 mg/dL — AB
Leukocytes,Ua: NEGATIVE
Nitrite: NEGATIVE
Protein, ur: 100 mg/dL — AB
Specific Gravity, Urine: 1.013 (ref 1.005–1.030)
pH: 7 (ref 5.0–8.0)

## 2020-03-28 LAB — COMPREHENSIVE METABOLIC PANEL
ALT: 20 U/L (ref 0–44)
AST: 25 U/L (ref 15–41)
Albumin: 4.1 g/dL (ref 3.5–5.0)
Alkaline Phosphatase: 161 U/L — ABNORMAL HIGH (ref 38–126)
Anion gap: 18 — ABNORMAL HIGH (ref 5–15)
BUN: 31 mg/dL — ABNORMAL HIGH (ref 6–20)
CO2: 22 mmol/L (ref 22–32)
Calcium: 9.5 mg/dL (ref 8.9–10.3)
Chloride: 96 mmol/L — ABNORMAL LOW (ref 98–111)
Creatinine, Ser: 4.51 mg/dL — ABNORMAL HIGH (ref 0.44–1.00)
GFR, Estimated: 11 mL/min — ABNORMAL LOW (ref >60)
Glucose, Bld: 80 mg/dL (ref 70–99)
Potassium: 3.8 mmol/L (ref 3.5–5.1)
Sodium: 136 mmol/L (ref 135–145)
Total Bilirubin: 0.8 mg/dL (ref 0.3–1.2)
Total Protein: 7.8 g/dL (ref 6.5–8.1)

## 2020-03-28 LAB — I-STAT CHEM 8, ED
BUN: 34 mg/dL — ABNORMAL HIGH (ref 6–20)
Calcium, Ion: 0.99 mmol/L — ABNORMAL LOW (ref 1.15–1.40)
Chloride: 97 mmol/L — ABNORMAL LOW (ref 98–111)
Creatinine, Ser: 4.7 mg/dL — ABNORMAL HIGH (ref 0.44–1.00)
Glucose, Bld: 80 mg/dL (ref 70–99)
HCT: 37 % (ref 36.0–46.0)
Hemoglobin: 12.6 g/dL (ref 12.0–15.0)
Potassium: 3.8 mmol/L (ref 3.5–5.1)
Sodium: 135 mmol/L (ref 135–145)
TCO2: 24 mmol/L (ref 22–32)

## 2020-03-28 LAB — RESP PANEL BY RT-PCR (FLU A&B, COVID) ARPGX2
Influenza A by PCR: NEGATIVE
Influenza B by PCR: NEGATIVE
SARS Coronavirus 2 by RT PCR: NEGATIVE

## 2020-03-28 LAB — PROTIME-INR
INR: 1.1 (ref 0.8–1.2)
Prothrombin Time: 13.3 seconds (ref 11.4–15.2)

## 2020-03-28 LAB — LIPASE, BLOOD: Lipase: 73 U/L — ABNORMAL HIGH (ref 11–51)

## 2020-03-28 LAB — APTT: aPTT: 87 seconds — ABNORMAL HIGH (ref 24–36)

## 2020-03-28 LAB — LACTIC ACID, PLASMA: Lactic Acid, Venous: 1.8 mmol/L (ref 0.5–1.9)

## 2020-03-28 MED ORDER — GABAPENTIN 100 MG PO CAPS
100.0000 mg | ORAL_CAPSULE | Freq: Two times a day (BID) | ORAL | Status: DC
  Administered 2020-03-28 – 2020-03-29 (×2): 100 mg via ORAL
  Filled 2020-03-28 (×2): qty 1

## 2020-03-28 MED ORDER — SODIUM CHLORIDE 0.9 % IV SOLN: Freq: Once | INTRAVENOUS | Status: DC

## 2020-03-28 MED ORDER — VANCOMYCIN HCL 1000 MG/200ML IV SOLN
1000.0000 mg | Freq: Once | INTRAVENOUS | Status: AC
  Administered 2020-03-28: 1000 mg via INTRAVENOUS
  Filled 2020-03-28: qty 200

## 2020-03-28 MED ORDER — SODIUM CHLORIDE 0.9 % IV SOLN
1.0000 g | INTRAVENOUS | Status: DC
  Administered 2020-03-29: 1 g via INTRAVENOUS
  Filled 2020-03-28: qty 1

## 2020-03-28 MED ORDER — ACETAMINOPHEN 325 MG PO TABS
650.0000 mg | ORAL_TABLET | Freq: Once | ORAL | Status: AC
  Administered 2020-03-28: 650 mg via ORAL
  Filled 2020-03-28: qty 2

## 2020-03-28 MED ORDER — ONDANSETRON 4 MG PO TBDP: 4.0000 mg | ORAL_TABLET | Freq: Three times a day (TID) | ORAL | Status: DC | PRN

## 2020-03-28 MED ORDER — MIDODRINE HCL 5 MG PO TABS
15.0000 mg | ORAL_TABLET | Freq: Once | ORAL | Status: AC
  Administered 2020-03-28: 15 mg via ORAL
  Filled 2020-03-28: qty 3

## 2020-03-28 MED ORDER — METOCLOPRAMIDE HCL 5 MG/ML IJ SOLN
10.0000 mg | Freq: Four times a day (QID) | INTRAMUSCULAR | Status: DC
  Administered 2020-03-28 – 2020-03-29 (×3): 10 mg via INTRAVENOUS
  Filled 2020-03-28 (×3): qty 2

## 2020-03-28 MED ORDER — LACTATED RINGERS IV SOLN: INTRAVENOUS | Status: DC

## 2020-03-28 MED ORDER — SODIUM CHLORIDE 0.9 % IV SOLN
2.0000 g | Freq: Once | INTRAVENOUS | Status: AC
  Administered 2020-03-28: 2 g via INTRAVENOUS
  Filled 2020-03-28: qty 2

## 2020-03-28 MED ORDER — DICLOFENAC SODIUM 1 % EX GEL
2.0000 g | Freq: Four times a day (QID) | CUTANEOUS | Status: DC
  Administered 2020-03-28 – 2020-03-29 (×3): 2 g via TOPICAL
  Filled 2020-03-28: qty 100

## 2020-03-28 MED ORDER — FAMOTIDINE 20 MG PO TABS
20.0000 mg | ORAL_TABLET | Freq: Two times a day (BID) | ORAL | Status: DC
  Administered 2020-03-28 – 2020-03-29 (×2): 20 mg via ORAL
  Filled 2020-03-28 (×2): qty 1

## 2020-03-28 MED ORDER — ASPIRIN EC 81 MG PO TBEC
81.0000 mg | DELAYED_RELEASE_TABLET | Freq: Every day | ORAL | Status: DC
  Administered 2020-03-29: 81 mg via ORAL
  Filled 2020-03-28: qty 1

## 2020-03-28 MED ORDER — PIPERACILLIN-TAZOBACTAM 3.375 G IVPB 30 MIN: 3.3750 g | Freq: Once | INTRAVENOUS | Status: DC

## 2020-03-28 MED ORDER — CALCIUM ACETATE (PHOS BINDER) 667 MG PO CAPS
667.0000 mg | ORAL_CAPSULE | Freq: Three times a day (TID) | ORAL | Status: DC
  Administered 2020-03-29: 667 mg via ORAL
  Filled 2020-03-28 (×2): qty 1

## 2020-03-28 MED ORDER — ONDANSETRON HCL 4 MG/2ML IJ SOLN
4.0000 mg | Freq: Once | INTRAMUSCULAR | Status: AC
  Administered 2020-03-28: 4 mg via INTRAVENOUS
  Filled 2020-03-28: qty 2

## 2020-03-28 MED ORDER — ALLOPURINOL 100 MG PO TABS
100.0000 mg | ORAL_TABLET | Freq: Every day | ORAL | Status: DC
  Administered 2020-03-29: 100 mg via ORAL
  Filled 2020-03-28: qty 1

## 2020-03-28 MED ORDER — VANCOMYCIN HCL IN DEXTROSE 500-5 MG/100ML-% IV SOLN: 500.0000 mg | INTRAVENOUS | Status: DC

## 2020-03-28 MED ORDER — MIDODRINE HCL 5 MG PO TABS
15.0000 mg | ORAL_TABLET | Freq: Three times a day (TID) | ORAL | Status: DC
  Administered 2020-03-29 (×2): 15 mg via ORAL
  Filled 2020-03-28 (×2): qty 3

## 2020-03-28 MED ORDER — HEPARIN SODIUM (PORCINE) 5000 UNIT/ML IJ SOLN
5000.0000 [IU] | Freq: Three times a day (TID) | INTRAMUSCULAR | Status: DC
  Administered 2020-03-28 – 2020-03-29 (×3): 5000 [IU] via SUBCUTANEOUS
  Filled 2020-03-28 (×3): qty 1

## 2020-03-28 MED ORDER — HYDROCORTISONE NA SUCCINATE PF 100 MG IJ SOLR
50.0000 mg | Freq: Four times a day (QID) | INTRAMUSCULAR | Status: DC
  Administered 2020-03-28 – 2020-03-29 (×4): 50 mg via INTRAVENOUS
  Filled 2020-03-28 (×4): qty 2

## 2020-03-28 MED ORDER — ZOLPIDEM TARTRATE 5 MG PO TABS
5.0000 mg | ORAL_TABLET | Freq: Every evening | ORAL | Status: DC | PRN
  Filled 2020-03-28: qty 1

## 2020-03-28 MED ORDER — LATANOPROST 0.005 % OP SOLN
1.0000 [drp] | Freq: Every day | OPHTHALMIC | Status: DC
  Administered 2020-03-28: 1 [drp] via OPHTHALMIC
  Filled 2020-03-28: qty 2.5

## 2020-03-28 NOTE — ED Triage Notes (Signed)
Pt from dialysis, only completed 1 hr today. Pt HR went up to 200, when EMS arrived HR was in a fib RVR 180-220. Pt given 20mg  cardizem en route, HR 130-160 upon arrival to ED. Pt hypotensive after cardizem, 85 palpated. Pt did not take her midodrine yet today. Pt a.o, c.o chronic generalized pain, pt febrile.

## 2020-03-28 NOTE — H&P (Addendum)
History and Physical    Tina Watkins FWY:[id number] DOB: 1965-12-30 DOA: 03/28/2020  PCP: Tina Glatter, FNP (Confirm with patient/family/NH records and if not entered, this has to be entered at [facility] point of entry) Patient coming from: Home  I have personally briefly reviewed patient's old medical records in [place]  Chief Complaint: Nausea Vomiting  HPI: Tina Watkins is a 54 y.o. female with medical history significant of ESRD on HD, s/p failed renal transplant but still makes small amount of urine, HTN, history of orthostatic hypotension on midodrine, gastroparesis, Gout on chronic steroid, chronic diastolic CHF, moderate pulmonary hypertension, presented with rapid A. fib and low fever.  Patient has history of chronic gastroparesis with frequent flareups and she started to have another flareup with symptoms of nausea and frequent vomiting of stomach content 2 days ago, denied any blood or coffee-ground stuff or bile in the vomitus.  No fever or chills.  No cough.  He still makes small urine.  There, but denied any symptoms of dysuria.  She has been using the same right femoral HD catheter since June 2021, she denied any pain swelling of the HD catheter insertion site.  Denied any diarrhea.  Today patient went to her regular HD, after starting it dialysis for about 1 hour patient went into rapid A. fib and HD reported patient sent to ED.  EMS arrived found patient heart rate in the 150s, 20 mg Cardizem was pushed and patient blood pressure dropped. ED Course: CT abd showed no significant obstruction.  Patient spiked fever 100.5 F, broad-spectrum IV antibiotics started in ED.  X-ray clear, UA pending. BP improved with 500 ML IV bolus. Sinus rhythm restored.  Review of Systems: As per HPI otherwise 14 point review of systems negative.    Past Medical History:  Diagnosis Date  . Bacteremia due to Gram-negative bacteria 05/23/2011  . Blind    right eye  .  Blind right eye   . CHF (congestive heart failure) ([place])   . Chronic lower back pain   . Complication of anesthesia    "woke up during OR; I have an extremely high tolerance" (12/11/2011) 1 procedure was graft; the other procedures were procedures that are typically done with sedation.  . DDD (degenerative disc disease), cervical   . Depression   . Dysrhythmia    "tachycardia" (12/11/2011) new onset afib 10/15/14 EKG  . E coli bacteremia 06/18/2011  . Elevated LDL cholesterol level 12/2018  . ESRD (end stage renal disease) ([place]) 06/12/2011  . Fibromyalgia   . Gastroparesis   . Gastroparesis   . GERD (gastroesophageal reflux disease)   . Glaucoma    right eye  . Gout   . H/O carpal tunnel syndrome   . Headache 10/2019  . Headache(784.0)    "not often anymore" (12/11/2011)  . Herpes genitalia 1994  . History of blood transfusion    "more than a few times" (12/11/2011)  . History of stomach ulcers   . Hypotension   . Iron deficiency anemia   . New onset a-fib ([place])    10/15/14 EKG  . Osteopenia   . Peripheral neuropathy 11/2018  . Pressure ulcer of sacral region, stage 1 07/2019  . Seizures ([place]) 1994   "post transplant; only have had that one" (12/11/2011)  . Spinal stenosis in cervical region   . Stroke [facility])     left basal ganglia lacunar infarct; Right frontal lobe lacunar infarct.  . Stroke ([place]) ~ 1999;  2001   "briefly lost my vision; lost my right eye" (12/11/2011)  . Vitamin D deficiency 12/2018    Past Surgical History:  Procedure Laterality Date  . ANTERIOR CERVICAL DECOMP/DISCECTOMY FUSION N/A 01/08/2015   Procedure: Anterior Cervical Three-Four/Four-Five Decompression/Diskectomy/Fusion;  Surgeon: Leeroy Cha, MD;  Location: [facility] NEURO ORS;  Service: Neurosurgery;  Laterality: N/A;  C3-4 C4-5 Anterior cervical decompression/diskectomy/fusion  . APPENDECTOMY  ~ 2004  . CATARACT EXTRACTION     right eye  . COLONOSCOPY    . ENUCLEATION  2001   "right"  .  ESOPHAGOGASTRODUODENOSCOPY (EGD) WITH PROPOFOL N/A 04/21/2012   Procedure: ESOPHAGOGASTRODUODENOSCOPY (EGD) WITH PROPOFOL;  Surgeon: Milus Banister, MD;  Location: [facility] ENDOSCOPY;  Service: Endoscopy;  Laterality: N/A;  . 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 IR FLUORO GUIDE CV LINE RIGHT  07/11/2019  . IR FLUORO GUIDE CV LINE RIGHT  10/20/2019  . IR RADIOLOGY PERIPHERAL GUIDED IV START  07/11/2019  . IR US GUIDE VASC ACCESS RIGHT  07/11/2019  . IR US GUIDE VASC ACCESS RIGHT  07/11/2019  . IR US GUIDE VASC ACCESS RIGHT  07/11/2019  . [place]; 1999; 2005   "right"  . MULTIPLE TOOTH EXTRACTIONS    . RIGHT HEART CATH N/A 03/23/2019   Procedure: RIGHT HEART CATH;  Surgeon: Jolaine Artist, MD;  Location: [facility] CV LAB;  Service: Cardiovascular;  Laterality: N/A;  . TONSILLECTOMY    . [place]?; 1994; 2005     reports that she has been smoking cigarettes. She has a 11.55 pack-year smoking history. She has never used smokeless tobacco. She reports current alcohol use. She reports current drug use. Frequency: 1.00 time per week. Drug: Marijuana.  Allergies  Allergen Reactions  . Levofloxacin Itching and Rash  . Tape Other (See Comments)    "Certain surgical tapes peel off my skin"    Family History  Problem Relation Age of Onset  . Glaucoma Mother   . Pancreatic cancer Father   . Multiple sclerosis Brother   . Hypertension Maternal Grandmother   . Breast cancer Neg Hx      Prior to Admission medications   Medication Sig Start Date End Date Taking? Authorizing Provider  allopurinol (ZYLOPRIM) 100 MG tablet Take 1 tablet (100 mg total) by mouth daily. 01/13/20   Tina Glatter, FNP  aspirin EC 81 MG tablet Take 81 mg by mouth daily.     [provider]  calcium acetate, Phos Binder, (PHOSLYRA) 667 MG/5ML SOLN Take by mouth 3 (three) times daily with meals.    [provider]  Colchicine 0.6 MG CAPS Take 0.6 mg by mouth as needed.    [provider]  diclofenac Sodium (VOLTAREN) 1 % GEL Apply 2 g topically 4 (four) times daily. 03/13/20   Leandrew Koyanagi, MD  diphenhydrAMINE (BENADRYL) 25 mg capsule Take 1 capsule (25 mg total) by mouth every 8 (eight) hours as needed. 07/21/19   Tina Glatter, FNP  famotidine (PEPCID) 20 MG tablet Take 1 tablet (20 mg total) by mouth 2 (two) times daily. 06/13/19   Tina Glatter, FNP  gabapentin (NEURONTIN) 100 MG capsule Take 100 mg by mouth 2 (two) times daily.  09/27/18   [provider]  latanoprost (XALATAN) 0.005 % ophthalmic solution Place 1 drop into the left eye at bedtime. 03/30/17   [provider]  lidocaine (LIDODERM) 5 % Place 1 patch onto the skin daily as needed (for pain- remove old patch first).  10/28/18   [provider]  Methoxy PEG-Epoetin Beta (MIRCERA IJ) Mircera 09/14/19 09/12/20  [provider]  midodrine (PROAMATINE) 10 MG tablet Take 1.5 tablets (15 mg total) by mouth 3 (three) times daily. 03/22/20   Bensimhon, Shaune Pascal, MD  ondansetron (ZOFRAN-ODT) 4 MG disintegrating tablet Take 1 tablet (4 mg total) by mouth every 8 (eight) hours as needed for nausea or vomiting. 06/13/19   Tina Glatter, FNP  oxyCODONE-acetaminophen (PERCOCET) 10-325 MG tablet Take 1 tablet by mouth 4 (four) times daily as needed.  09/08/19   [provider]  predniSONE (DELTASONE) 5 MG tablet Take 5 mg by mouth daily with breakfast.    [provider]  tacrolimus (PROGRAF) 1 MG capsule Take 2-3 mg by mouth See admin instructions. Take 3 mg by mouth in the morning and 2 mg at bedtime 06/22/11   [name], MD  Vitamin D, Ergocalciferol, (DRISDOL) 1.25 MG (50000 UNIT) CAPS capsule TAKE 1 CAPSULE (50,000 UNITS TOTAL) BY MOUTH EVERY 7 (SEVEN) DAYS. 01/17/20   Tina Glatter, FNP  zolpidem (AMBIEN) 10 MG tablet Take 1 tablet (10 mg total) by mouth at bedtime as needed  for sleep. 08/24/19   [name], DO    Physical Exam: Vitals:   03/28/20 1500 03/28/20 1505 03/28/20 1530 03/28/20 1600  BP: (!) 74/49  (!) 81/49 (!) 79/53  Pulse: 92  96 84  Resp: 19  (!) 24 (!) 22  Temp:  100.2 F (37.9 C)    TempSrc:  Oral    SpO2: 95%  95% 94%  Weight:      Height:        Constitutional: NAD, calm, comfortable Vitals:   03/28/20 1500 03/28/20 1505 03/28/20 1530 03/28/20 1600  BP: (!) 74/49  (!) 81/49 (!) 79/53  Pulse: 92  96 84  Resp: 19  (!) 24 (!) 22  Temp:  100.2 F (37.9 C)    TempSrc:  Oral    SpO2: 95%  95% 94%  Weight:      Height:       Eyes: PERRL, lids and conjunctivae normal ENMT: Mucous membranes are moist. Posterior pharynx clear of any exudate or lesions.Normal dentition.  Neck: normal, supple, no masses, no thyromegaly Respiratory: clear to auscultation bilaterally, no wheezing, no crackles. Normal respiratory effort. No accessory muscle use.  Cardiovascular: Regular rate and rhythm, loud systolic murmur on apex. No extremity edema. 2+ pedal pulses. No carotid bruits.  Abdomen: no tenderness, no masses palpated. No hepatosplenomegaly. Bowel sounds positive.  Musculoskeletal: no clubbing / cyanosis. No joint deformity upper and lower extremities. Good ROM, no contractures. Normal muscle tone.  Skin: Right groin HD insertion site clean, no swelling or tenderness. Neurologic: CN 2-12 grossly intact. Sensation intact, DTR normal. Strength 5/5 in all 4.  Psychiatric: Normal judgment and insight. Alert and oriented x 3. Normal mood.     Labs on Admission: I have personally reviewed following labs and imaging studies  CBC: Recent Labs  Lab 03/28/20 1330 03/28/20 1347  WBC 7.8  --   NEUTROABS 6.6  --   HGB 11.9* 12.6  HCT 37.2 37.0  MCV 88.6  --   PLT 226  --    Basic Metabolic Panel: Recent Labs  Lab 03/28/20 1330 03/28/20 1347  NA 136 135  K 3.8 3.8  CL 96* 97*  CO2 22  --   GLUCOSE 80 80  BUN 31* 34*  CREATININE  4.51* 4.70*  CALCIUM 9.5  --    GFR: Estimated Creatinine Clearance: 9.5 mL/min (A) (by C-G formula based on SCr of 4.7 mg/dL (H)). Liver Function Tests: Recent Labs  Lab 03/28/20 1330  AST 25  ALT 20  ALKPHOS 161*  BILITOT 0.8  PROT 7.8  ALBUMIN 4.1   Recent Labs  Lab 03/28/20 1330  LIPASE 73*   No results for input(s): AMMONIA in the last 168 hours. Coagulation Profile: Recent Labs  Lab 03/28/20 1330  INR 1.1   Cardiac Enzymes: No results for input(s): CKTOTAL, CKMB, CKMBINDEX, TROPONINI in the last 168 hours. BNP (last 3 results) No results for input(s): PROBNP in the last 8760 hours. HbA1C: No results for input(s): HGBA1C in the last 72 hours. CBG: No results for input(s): GLUCAP in the last 168 hours. Lipid Profile: No results for input(s): CHOL, HDL, LDLCALC, TRIG, CHOLHDL, LDLDIRECT in the last 72 hours. Thyroid Function Tests: No results for input(s): TSH, T4TOTAL, FREET4, T3FREE, THYROIDAB in the last 72 hours. Anemia Panel: No results for input(s): VITAMINB12, FOLATE, FERRITIN, TIBC, IRON, RETICCTPCT in the last 72 hours. Urine analysis:    Component Value Date/Time   COLORURINE AMBER (A) 08/05/2019 1835   APPEARANCEUR HAZY (A) 08/05/2019 1835   LABSPEC 1.021 08/05/2019 1835   PHURINE 5.0 08/05/2019 1835   GLUCOSEU NEGATIVE 08/05/2019 1835   HGBUR SMALL (A) 08/05/2019 1835   BILIRUBINUR NEGATIVE 08/05/2019 1835   BILIRUBINUR Negative 01/04/2019 1030   KETONESUR NEGATIVE 08/05/2019 1835   PROTEINUR 30 (A) 08/05/2019 1835   UROBILINOGEN 0.2 01/04/2019 1030   UROBILINOGEN 0.2 10/15/2014 0946   NITRITE NEGATIVE 08/05/2019 1835   LEUKOCYTESUR NEGATIVE 08/05/2019 1835    Radiological Exams on Admission: CT ABDOMEN PELVIS WO CONTRAST  Result Date: 03/28/2020 CLINICAL DATA:  Abdominal pain and fever EXAM: CT ABDOMEN AND PELVIS WITHOUT CONTRAST TECHNIQUE: Multidetector CT imaging of the abdomen and pelvis was performed following the standard protocol  without oral or IV contrast. COMPARISON:  December 01, 2019; CT abdomen and pelvis May 12, 2019 FINDINGS: Lower chest: There are foci of scarring in portions of the right middle and right lower lobes. There is no edema or consolidation in the visualized lung bases. Hepatobiliary: No focal liver lesions are appreciable on this noncontrast enhanced study. The gallbladder wall is not appreciably thickened. Gallbladder upper normal in size. No biliary duct dilatation is evident. Pancreas: There is no pancreatic mass or inflammatory focus. Spleen: No splenic lesions are evident beyond apparent small splenic granuloma posteriorly. Adrenals/Urinary Tract: Right adrenal appears unremarkable. There is calcification in the left adrenal, stable. Patient has had surgical removal of native kidneys bilaterally. A transplant kidney is noted in the right lower quadrant, unchanged in size and configuration compared to previous study. There is no transplant kidney mass or hydronephrosis. No transplant renal or ureteral calculus evident. Urinary bladder is midline with wall thickness within normal limits. A focus of calcification is noted in the lower right pelvis, a likely failed transplant kidney which has this appearance is stable. Undergone atrophy with calcification. Stomach/Bowel: There is moderate stool throughout the colon. There is no appreciable bowel wall or mesenteric thickening. No evident bowel obstruction. The terminal ileum appears unremarkable. There is no appreciable free air or portal venous air. Vascular/Lymphatic: No abdominal aortic aneurysm. There is extensive aortic and pelvic arterial vascular calcification. There is a right femoral venous catheter with the tip  in the inferior vena cava. There is again noted enlargement of the visualized azygos and hemiazygous venous structures. There remains prominence of several retroperitoneal lymph nodes, stable from previous study. No new lymph node prominence is evident.  Reproductive: Uterus is anteverted.  No adnexal masses are evident. Other: Appendix absent. No periappendiceal region inflammation. No abscess or ascites is evident in the abdomen or pelvis. Musculoskeletal: There is avascular necrosis in the right femoral head region, stable compared to prior study. No blastic or lytic bone lesions are evident. No appreciable abdominal wall or intramuscular lesions. IMPRESSION: 1. Patient has had surgical removal of native kidneys. There is calcification in the right pelvis consistent with failed renal transplant. A renal transplant in the upper right pelvis appear stable compared to most recent studies without hydronephrosis or perinephric fluid. Urinary bladder appears unremarkable. 2. No bowel obstruction. No abscess in the abdomen or pelvis. No periappendiceal region inflammatory change. By report, patient has had appendectomy. 3. There is a right femoral venous catheter with tip in the inferior vena cava. There are prominent azygos and hemiazygous venous structures, a stable finding compared to prior studies. 4. Mild retroperitoneal adenopathy, stable. No new lymph node enlargement. 5. Aortic Atherosclerosis (ICD10-I70.0). Extensive pelvic arterial vascular calcification noted. 6. Calcification in the left adrenal may be indicative of prior inflammation in this area. No adrenal enlargement. 7.  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 Electronically Signed   By: Lowella Grip III M.D.   On: 03/28/2020 15:26   DG Chest Portable 1 View  Result Date: 03/28/2020 CLINICAL DATA:  Shortness of breath with fever and tachycardia EXAM: PORTABLE CHEST 1 VIEW COMPARISON:  December 12, 2019 FINDINGS: Lungs clear. Heart size within normal limits. Superior vena cava stent unchanged in position. No evident adenopathy. Old healed fracture of the right posterior sixth rib. Surgical clips noted in upper abdomen. IMPRESSION: Lungs clear. Stable cardiac silhouette. Stent in  superior vena cava unchanged in position. Electronically Signed   By: Lowella Grip III M.D.   On: 03/28/2020 14:09    EKG: Independently reviewed. Sinus.  Assessment/Plan Active Problems:   Sepsis ([place])  (please populate well all problems here in Problem List. (For example, if patient is on BP meds at home and you resume or decide to hold them, it is a problem that needs to be her. Same for CAD, COPD, HLD and so on)  Sepsis -Evidenced by tachycardia and low-grade fever, source less clear, UA pending and the femoral HD catheter insertion site looks clean.  For now, will maintain patient on broad spectrum antibiotic coverage of vancomycin and cefepime until bacteremia ruled out. -Will not start maintenance IVF given patient ESRD status. -TTE screening. -Midodrine for hypotension, start stress dose of steroid -Hold her immune modulation meds for now.  PAF -Resolved, likely related to sepsis -CHADS2=1, continue ASA.  Gastroparesis -As needed medications.  ESRD on HD -Nephro contacted. No indication for emergency HD today. -Change her steroid to stress dose -Hold other immunomodulating meds until bacteremia ruled out -D/W nephro, pt has a strong Hx of poor HD access, or her upper body access occluded including arms and subclavian.  The femoral access appears to be only available HD access since June 2021, there is a recent plan to start patient on PD however the PD catheter not installed yet.  History of orthostatic hypotension -At baseline systolic BP in lower 41Y, continue midodrine, start stress dose of steroid.  Gout -No symptoms signs of acute flareup.  DVT prophylaxis: Heparin subcu  code Status:Full Code Family Communication: None at bedside Disposition Plan: Expect more than 2 midnight hospital stay to treat sepsis. Consults called: Nephro Admission status: PCU   Lequita Halt MD [facility] Pager [phone]  03/28/2020, 4:27 PM

## 2020-03-28 NOTE — ED Provider Notes (Signed)
[facility] EMERGENCY DEPARTMENT Provider Note   CSN: [id number] Arrival date & time: 03/28/20  1326     History Chief Complaint  Patient presents with  . Tachycardia  . Weakness    Tina Watkins is a 55 y.o. female.  The history is provided by the patient.  Weakness Severity:  Mild Onset quality:  Gradual Duration:  2 days Timing:  Constant Progression:  Unchanged Chronicity:  New Context: dehydration (nausea and vomting last few days as her gastroparesis was bad. Went to dialysis today and one hour in her heart rate jumped to 200s. EMS gave diltazem and blood pressure now in 80s but normal per patient. HR in 150s now.)   Relieved by:  Nothing Worsened by:  Nothing Associated symptoms: abdominal pain, nausea and vomiting   Associated symptoms: no arthralgias, no chest pain, no cough, no diarrhea, no dysuria, no fever, no seizures and no shortness of breath   Risk factors comment:  Esrd      Past Medical History:  Diagnosis Date  . Bacteremia due to Gram-negative bacteria 05/23/2011  . Blind    right eye  . Blind right eye   . CHF (congestive heart failure) ([place])   . Chronic lower back pain   . Complication of anesthesia    "woke up during OR; I have an extremely high tolerance" (12/11/2011) 1 procedure was graft; the other procedures were procedures that are typically done with sedation.  . DDD (degenerative disc disease), cervical   . Depression   . Dysrhythmia    "tachycardia" (12/11/2011) new onset afib 10/15/14 EKG  . E coli bacteremia 06/18/2011  . Elevated LDL cholesterol level 12/2018  . ESRD (end stage renal disease) ([place]) 06/12/2011  . Fibromyalgia   . Gastroparesis   . Gastroparesis   . GERD (gastroesophageal reflux disease)   . Glaucoma    right eye  . Gout   . H/O carpal tunnel syndrome   . Headache 10/2019  . Headache(784.0)    "not often anymore" (12/11/2011)  . Herpes genitalia 1994  . History of blood transfusion     "more than a few times" (12/11/2011)  . History of stomach ulcers   . Hypotension   . Iron deficiency anemia   . New onset a-fib ([place])    10/15/14 EKG  . Osteopenia   . Peripheral neuropathy 11/2018  . Pressure ulcer of sacral region, stage 1 07/2019  . Seizures ([place]) 1994   "post transplant; only have had that one" (12/11/2011)  . Spinal stenosis in cervical region   . Stroke [facility])     left basal ganglia lacunar infarct; Right frontal lobe lacunar infarct.  . Stroke [facility]) ~ 1999; 2001   "briefly lost my vision; lost my right eye" (12/11/2011)  . Vitamin D deficiency 12/2018    Patient Active Problem List   Diagnosis Date Noted  . Chronic pain of both knees 03/13/2020  . Anemia 09/02/2019  . Decubitus ulcer of sacral region, stage 1 08/18/2019  . HCAP (healthcare-associated pneumonia) 08/05/2019  . Pressure injury of skin 07/24/2019  . Chest congestion 07/24/2019  . Itching 07/24/2019  . Weakness 06/13/2019  . Pneumonia due to COVID-19 virus 05/09/2019  . Hypotension 05/09/2019  . Symptomatic anemia 05/09/2019  . CHF (congestive heart failure) ([place]) 05/09/2019  . Swollen abdomen 01/04/2019  . DDD (degenerative disc disease), cervical 12/06/2018  . Neuropathy 12/06/2018  . Paresthesia 10/11/2018  . Neck pain 10/11/2018  . Tobacco abuse 11/20/2017  .  Right leg swelling 11/20/2017  . CKD (chronic kidney disease), stage III ([place]) 11/20/2017  . Right leg pain 11/20/2017  . Stroke (cerebrum) ([place]) 11/20/2017  . GERD (gastroesophageal reflux disease) 11/20/2017  . Acute venous embolism and thrombosis of deep vessels of proximal lower extremity, right ([place]) 11/20/2017  . 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 Thrush, oral   . AKI (acute kidney injury) ([place])   . Multifocal pneumonia 08/09/2017  . Chest pain 09/29/2015  . Cervical stenosis of spinal canal 01/08/2015  . Urinary tract infectious disease   . Muscle spasms of neck  06/15/2014  . Bleeding hemorrhoid 06/15/2014  . Anemia in chronic kidney disease 06/15/2014  . Pyelonephritis, acute 06/13/2014  . Sepsis ([place]) 06/13/2014  . History of kidney transplant   . Chronic pain syndrome 04/09/2012  . Dehydration, mild 04/09/2012  . Gout attack 06/23/2011  . Herpes infection 06/23/2011  . Anxiety 06/23/2011  . E coli bacteremia 06/18/2011  . ESRD (end stage renal disease) ([place]) 06/12/2011  . UTI (lower urinary tract infection) 06/12/2011  . Bacteremia due to Gram-negative bacteria 05/23/2011  . History of renal transplantation 05/22/2011  . Septic shock(785.52) 05/22/2011  . Acute on chronic kidney failure ([place]) 05/22/2011  . Gastroparesis 04/24/2008  . WEIGHT LOSS 08/24/2007  . NAUSEA AND VOMITING 08/24/2007    Past Surgical History:  Procedure Laterality Date  . ANTERIOR CERVICAL DECOMP/DISCECTOMY FUSION N/A 01/08/2015   Procedure: Anterior Cervical Three-Four/Four-Five Decompression/Diskectomy/Fusion;  Surgeon: Leeroy Cha, MD;  Location: [facility] NEURO ORS;  Service: Neurosurgery;  Laterality: N/A;  C3-4 C4-5 Anterior cervical decompression/diskectomy/fusion  . APPENDECTOMY  ~ 2004  . CATARACT EXTRACTION     right eye  . COLONOSCOPY    . ENUCLEATION  2001   "right"  . ESOPHAGOGASTRODUODENOSCOPY (EGD) WITH PROPOFOL N/A 04/21/2012   Procedure: ESOPHAGOGASTRODUODENOSCOPY (EGD) WITH PROPOFOL;  Surgeon: Milus Banister, MD;  Location: [facility] ENDOSCOPY;  Service: Endoscopy;  Laterality: N/A;  . 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 IR FLUORO GUIDE CV LINE RIGHT  07/11/2019  . IR FLUORO GUIDE CV LINE RIGHT  10/20/2019  . IR RADIOLOGY PERIPHERAL GUIDED IV START  07/11/2019  . IR US GUIDE VASC ACCESS RIGHT  07/11/2019  . IR US GUIDE VASC ACCESS RIGHT  07/11/2019  . IR US GUIDE VASC ACCESS RIGHT  07/11/2019  . [place]; 1999; 2005   "right"  . MULTIPLE TOOTH EXTRACTIONS    . RIGHT HEART CATH  N/A 03/23/2019   Procedure: RIGHT HEART CATH;  Surgeon: Jolaine Artist, MD;  Location: [facility] CV LAB;  Service: Cardiovascular;  Laterality: N/A;  . TONSILLECTOMY    . TOTAL NEPHRECTOMY  1988?; 1994; 2005     OB History    Gravida  1   Para      Term      Preterm      AB  1   Living        SAB  1   IAB      Ectopic      Multiple      Live Births              Family History  Problem Relation Age of Onset  . Glaucoma Mother   . Pancreatic cancer Father   . Multiple sclerosis Brother   . Hypertension Maternal Grandmother   .  Breast cancer Neg Hx     Social History   Tobacco Use  . Smoking status: Current Every Day Smoker    Packs/day: 0.33    Years: 35.00    Pack years: 11.55    Types: Cigarettes  . Smokeless tobacco: Never Used  . Tobacco comment: has patches but not currently using - plan to start using first of January 2022  Vaping Use  . Vaping Use: Never used  Substance Use Topics  . Alcohol use: Yes    Comment: rare if out to dinner  . Drug use: Yes    Frequency: 1.0 times per week    Types: Marijuana    Comment: smoking once a day when in severe pain    Home Medications Prior to Admission medications   Medication Sig Start Date End Date Taking? Authorizing Provider  allopurinol (ZYLOPRIM) 100 MG tablet Take 1 tablet (100 mg total) by mouth daily. 01/13/20   [name], FNP  aspirin EC 81 MG tablet Take 81 mg by mouth daily.     [provider]  calcium acetate, Phos Binder, (PHOSLYRA) 667 MG/5ML SOLN Take by mouth 3 (three) times daily with meals.    [provider]  Colchicine 0.6 MG CAPS Take 0.6 mg by mouth as needed.    [provider]  diclofenac Sodium (VOLTAREN) 1 % GEL Apply 2 g topically 4 (four) times daily. 03/13/20   Leandrew Koyanagi, MD  diphenhydrAMINE (BENADRYL) 25 mg capsule Take 1 capsule (25 mg total) by mouth every 8 (eight) hours as needed. 07/21/19   [name], FNP   famotidine (PEPCID) 20 MG tablet Take 1 tablet (20 mg total) by mouth 2 (two) times daily. 06/13/19   [name], FNP  gabapentin (NEURONTIN) 100 MG capsule Take 100 mg by mouth 2 (two) times daily.  09/27/18   [provider]  latanoprost (XALATAN) 0.005 % ophthalmic solution Place 1 drop into the left eye at bedtime. 03/30/17   [provider]  lidocaine (LIDODERM) 5 % Place 1 patch onto the skin daily as needed (for pain- remove old patch first).  10/28/18   [provider]  Methoxy PEG-Epoetin Beta (MIRCERA IJ) Mircera 09/14/19 09/12/20  [provider]  midodrine (PROAMATINE) 10 MG tablet Take 1.5 tablets (15 mg total) by mouth 3 (three) times daily. 03/22/20   Bensimhon, Shaune Pascal, MD  ondansetron (ZOFRAN-ODT) 4 MG disintegrating tablet Take 1 tablet (4 mg total) by mouth every 8 (eight) hours as needed for nausea or vomiting. 06/13/19   [name], FNP  oxyCODONE-acetaminophen (PERCOCET) 10-325 MG tablet Take 1 tablet by mouth 4 (four) times daily as needed.  09/08/19   [provider]  predniSONE (DELTASONE) 5 MG tablet Take 5 mg by mouth daily with breakfast.    [provider]  tacrolimus (PROGRAF) 1 MG capsule Take 2-3 mg by mouth See admin instructions. Take 3 mg by mouth in the morning and 2 mg at bedtime 06/22/11   [name], MD  Vitamin D, Ergocalciferol, (DRISDOL) 1.25 MG (50000 UNIT) CAPS capsule TAKE 1 CAPSULE (50,000 UNITS TOTAL) BY MOUTH EVERY 7 (SEVEN) DAYS. 01/17/20   [name], FNP  zolpidem (AMBIEN) 10 MG tablet Take 1 tablet (10 mg total) by mouth at bedtime as needed for sleep. 08/24/19   [name], DO    Allergies    Levofloxacin and Tape  Review of Systems   Review of Systems  Constitutional:  Negative for chills and fever.  HENT: Negative for ear pain and sore throat.   Eyes: Negative for pain and visual disturbance.  Respiratory: Negative for cough and shortness of breath.    Cardiovascular: Negative for chest pain and palpitations.  Gastrointestinal: Positive for abdominal pain, nausea and vomiting. Negative for diarrhea.  Genitourinary: Negative for dysuria and hematuria.  Musculoskeletal: Negative for arthralgias and back pain.  Skin: Negative for color change and rash.  Neurological: Positive for weakness. Negative for seizures and syncope.  All other systems reviewed and are negative.   Physical Exam Updated Vital Signs BP (!) 79/53   Pulse 84   Temp 100.2 F (37.9 C) (Oral)   Resp (!) 22   Ht 4\' 11"  (1.499 m)   Wt 47 kg   SpO2 94%   BMI 20.93 kg/m   Physical Exam Vitals and nursing note reviewed.  Constitutional:      General: She is not in acute distress.    Appearance: She is well-developed and well-nourished. She is ill-appearing.  HENT:     Head: Normocephalic and atraumatic.     Nose: Nose normal.     Mouth/Throat:     Mouth: Mucous membranes are moist.  Eyes:     Conjunctiva/sclera: Conjunctivae normal.     Pupils: Pupils are equal, round, and reactive to light.  Cardiovascular:     Rate and Rhythm: Regular rhythm. Tachycardia present.     Pulses: Normal pulses.     Heart sounds: Normal heart sounds. No murmur heard.   Pulmonary:     Effort: Pulmonary effort is normal. No respiratory distress.     Breath sounds: Normal breath sounds.  Abdominal:     Palpations: Abdomen is soft.     Tenderness: There is abdominal tenderness.  Musculoskeletal:        General: No edema.     Cervical back: Normal range of motion and neck supple.  Skin:    General: Skin is warm and dry.     Capillary Refill: Capillary refill takes less than 2 seconds.  Neurological:     General: No focal deficit present.     Mental Status: She is alert.  Psychiatric:        Mood and Affect: Mood and affect normal.     ED Results / Procedures / Treatments   Labs (all labs ordered are listed, but only abnormal results are displayed) Labs Reviewed   COMPREHENSIVE METABOLIC PANEL - Abnormal; Notable for the following components:      Result Value   Chloride 96 (*)    BUN 31 (*)    Creatinine, Ser 4.51 (*)    Alkaline Phosphatase 161 (*)    GFR, Estimated 11 (*)    Anion gap 18 (*)    All other components within normal limits  CBC WITH DIFFERENTIAL/PLATELET - Abnormal; Notable for the following components:   Hemoglobin 11.9 (*)    RDW 24.0 (*)    Lymphs Abs 0.5 (*)    All other components within normal limits  APTT - Abnormal; Notable for the following components:   aPTT 87 (*)    All other components within normal limits  LIPASE, BLOOD - Abnormal; Notable for the following components:   Lipase 73 (*)    All other components within normal limits  I-STAT CHEM 8, ED - Abnormal; Notable for the following components:   Chloride 97 (*)    BUN 34 (*)    Creatinine, Ser 4.70 (*)  Calcium, Ion 0.99 (*)    All other components within normal limits  RESP PANEL BY RT-PCR (FLU A&B, COVID) ARPGX2  CULTURE, BLOOD (ROUTINE X 2)  CULTURE, BLOOD (ROUTINE X 2)  URINE CULTURE  LACTIC ACID, PLASMA  PROTIME-INR  URINALYSIS, ROUTINE W REFLEX MICROSCOPIC    EKG EKG Interpretation  Date/Time:  Thursday March 28 2020 13:28:29 EST Ventricular Rate:  157 PR Interval:    QRS Duration: 69 QT Interval:  299 QTC Calculation: 494 R Axis:   62 Text Interpretation: Atrial flutter with predominant 2:1 AV block Repol abnrm suggests ischemia, diffuse leads Confirmed by Lennice Sites [phone]) on 03/28/2020 1:36:02 PM   Radiology CT ABDOMEN PELVIS WO CONTRAST  Result Date: 03/28/2020 CLINICAL DATA:  Abdominal pain and fever EXAM: CT ABDOMEN AND PELVIS WITHOUT CONTRAST TECHNIQUE: Multidetector CT imaging of the abdomen and pelvis was performed following the standard protocol without oral or IV contrast. COMPARISON:  December 01, 2019; CT abdomen and pelvis May 12, 2019 FINDINGS: Lower chest: There are foci of scarring in portions of the right  middle and right lower lobes. There is no edema or consolidation in the visualized lung bases. Hepatobiliary: No focal liver lesions are appreciable on this noncontrast enhanced study. The gallbladder wall is not appreciably thickened. Gallbladder upper normal in size. No biliary duct dilatation is evident. Pancreas: There is no pancreatic mass or inflammatory focus. Spleen: No splenic lesions are evident beyond apparent small splenic granuloma posteriorly. Adrenals/Urinary Tract: Right adrenal appears unremarkable. There is calcification in the left adrenal, stable. Patient has had surgical removal of native kidneys bilaterally. A transplant kidney is noted in the right lower quadrant, unchanged in size and configuration compared to previous study. There is no transplant kidney mass or hydronephrosis. No transplant renal or ureteral calculus evident. Urinary bladder is midline with wall thickness within normal limits. A focus of calcification is noted in the lower right pelvis, a likely failed transplant kidney which has this appearance is stable. Undergone atrophy with calcification. Stomach/Bowel: There is moderate stool throughout the colon. There is no appreciable bowel wall or mesenteric thickening. No evident bowel obstruction. The terminal ileum appears unremarkable. There is no appreciable free air or portal venous air. Vascular/Lymphatic: No abdominal aortic aneurysm. There is extensive aortic and pelvic arterial vascular calcification. There is a right femoral venous catheter with the tip in the inferior vena cava. There is again noted enlargement of the visualized azygos and hemiazygous venous structures. There remains prominence of several retroperitoneal lymph nodes, stable from previous study. No new lymph node prominence is evident. Reproductive: Uterus is anteverted.  No adnexal masses are evident. Other: Appendix absent. No periappendiceal region inflammation. No abscess or ascites is evident in  the abdomen or pelvis. Musculoskeletal: There is avascular necrosis in the right femoral head region, stable compared to prior study. No blastic or lytic bone lesions are evident. No appreciable abdominal wall or intramuscular lesions. IMPRESSION: 1. Patient has had surgical removal of native kidneys. There is calcification in the right pelvis consistent with failed renal transplant. A renal transplant in the upper right pelvis appear stable compared to most recent studies without hydronephrosis or perinephric fluid. Urinary bladder appears unremarkable. 2. No bowel obstruction. No abscess in the abdomen or pelvis. No periappendiceal region inflammatory change. By report, patient has had appendectomy. 3. There is a right femoral venous catheter with tip in the inferior vena cava. There are prominent azygos and hemiazygous venous structures, a stable finding compared to prior studies. 4.  Mild retroperitoneal adenopathy, stable. No new lymph node enlargement. 5. Aortic Atherosclerosis (ICD10-I70.0). Extensive pelvic arterial vascular calcification noted. 6. Calcification in the left adrenal may be indicative of prior inflammation in this area. No adrenal enlargement. 7.  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 Electronically Signed   By: Lowella Grip III M.D.   On: 03/28/2020 15:26   DG Chest Portable 1 View  Result Date: 03/28/2020 CLINICAL DATA:  Shortness of breath with fever and tachycardia EXAM: PORTABLE CHEST 1 VIEW COMPARISON:  December 12, 2019 FINDINGS: Lungs clear. Heart size within normal limits. Superior vena cava stent unchanged in position. No evident adenopathy. Old healed fracture of the right posterior sixth rib. Surgical clips noted in upper abdomen. IMPRESSION: Lungs clear. Stable cardiac silhouette. Stent in superior vena cava unchanged in position. Electronically Signed   By: Lowella Grip III M.D.   On: 03/28/2020 14:09    Procedures .Critical Care Performed by:  Lennice Sites, DO Authorized by: Lennice Sites, DO   Critical care provider statement:    Critical care time (minutes):  45   Critical care was necessary to treat or prevent imminent or life-threatening deterioration of the following conditions:  Sepsis   Critical care was time spent personally by me on the following activities:  Blood draw for specimens, development of treatment plan with patient or surrogate, discussions with primary provider, evaluation of patient's response to treatment, examination of patient, obtaining history from patient or surrogate, ordering and performing treatments and interventions, ordering and review of laboratory studies, ordering and review of radiographic studies, pulse oximetry, re-evaluation of patient's condition and review of old charts   I assumed direction of critical care for this patient from another provider in my specialty: no       Medications Ordered in ED Medications  lactated ringers infusion ( Intravenous New Bag/Given 03/28/20 1411)  allopurinol (ZYLOPRIM) tablet 100 mg (has no administration in time range)  aspirin EC tablet 81 mg (has no administration in time range)  midodrine (PROAMATINE) tablet 15 mg (has no administration in time range)  hydrocortisone sodium succinate (SOLU-CORTEF) 100 MG injection 50 mg (has no administration in time range)  zolpidem (AMBIEN) tablet 5 mg (has no administration in time range)  calcium acetate (PHOSLO) capsule 667 mg (has no administration in time range)  famotidine (PEPCID) tablet 20 mg (has no administration in time range)  ondansetron (ZOFRAN-ODT) disintegrating tablet 4 mg (has no administration in time range)  gabapentin (NEURONTIN) capsule 100 mg (has no administration in time range)  diclofenac Sodium (VOLTAREN) 1 % topical gel 2 g (has no administration in time range)  latanoprost (XALATAN) 0.005 % ophthalmic solution 1 drop (has no administration in time range)  heparin injection 5,000 Units  (has no administration in time range)  acetaminophen (TYLENOL) tablet 650 mg (650 mg Oral Given 03/28/20 1402)  ondansetron (ZOFRAN) injection 4 mg (4 mg Intravenous Given 03/28/20 1412)  vancomycin (VANCOREADY) IVPB 1000 mg/200 mL (1,000 mg Intravenous New Bag/Given 03/28/20 1504)  ceFEPIme (MAXIPIME) 2 g in sodium chloride 0.9 % 100 mL IVPB (0 g Intravenous Stopped 03/28/20 1501)  midodrine (PROAMATINE) tablet 15 mg (15 mg Oral Given 03/28/20 1529)    ED Course  I have reviewed the triage vital signs and the nursing notes.  Pertinent labs & imaging results that were available during my care of the patient were reviewed by me and considered in my medical decision making (see chart for details).    MDM Rules/Calculators/A&P  Tina Watkins is a 54 year old female with history of stroke, bacteremia, gastroparesis, A. fib, end-stage renal disease on hemodialysis who presents the ED with weakness.  Patient arrives febrile and hypotensive and tachycardic.  EKG shows atrial flutter.  Heart rate was in the 200s with EMS and they gave 20 mg of diltiazem.  Heart rate now in the 150s.  Patient did not know she had a fever.  She was at dialysis and had about 1 hour of dialysis when all of a sudden her heart rate went fast.  She has been complaining of abdominal pain for the last several days.  She states that her gastroparesis symptoms have been really bad over the last 2 days and she has not been able to eat or drink anything.  She denies any cough.  She has vaccinated against COVID.  Sometimes she still does make some urine.  Overall concern for sepsis and will start broad-spectrum IV antibiotics.  Will start fluid bolus as well.  Patient states she does take midodrine for chronic hypotension and her normal blood pressures usually around 80 systolic.  Will hold on full 30 cc/kg of IV fluids given her dialysis history and concerned that low blood pressure may be her chronic blood  pressure/some diltiazem side effect.  Patient with no leukocytosis, no lactic acidosis.  Creatinine at baseline.  Covid test is negative.  CT scan showed overall no obstruction, no abscess.  Chest x-ray showed no signs of infection.  Patient does have her dialysis catheter in her right femoral.  Suspect that this could be bacteremia from that although dialysis catheter site appears clean and dry.  However with fever and no source high suspicion for that.  Blood pressure has been stable in the 80s.  Will admit to medicine at this time as she does have reassuring lactic acid and her mentation has been just fine.  Maps have been greater than 65.  This chart was dictated using voice recognition software.  Despite best efforts to proofread,  errors can occur which can change the documentation meaning.    Final Clinical Impression(s) / ED Diagnoses Final diagnoses:  Sepsis, due to unspecified organism, unspecified whether acute organ dysfunction present [facility])  Atrial flutter, unspecified type [facility])    Rx / DC Orders ED Discharge Orders    None       Lennice Sites, DO 03/28/20 1650

## 2020-03-28 NOTE — Progress Notes (Signed)
Pharmacy Antibiotic Note  Tina Watkins is a 55 y.o. female admitted on 03/28/2020 with possible sepsis.  Patient tachycardic with HR into the 150s at HD today and spiked temp of 100.2, WBC 7.8.  Received HD today for ~1 hour.  Pharmacy has been consulted for vancomycin and cefepime dosing.  Plan: Vancomycin 1g IV x1, followed by Vancomycin 500mg  IV qHD  Cefepime 2g IV x1, followed by Cefepime 1g IV q24hours  F/u cultures, HD schedule, vanc levels as indicated  Height: 4\' 11"  (149.9 cm) Weight: 47 kg (103 lb 9.9 oz) IBW/kg (Calculated) : 43.2  Temp (24hrs), Avg:100.4 F (38 C), Min:100.2 F (37.9 C), Max:100.5 F (38.1 C)  Recent Labs  Lab 03/28/20 1330 03/28/20 1347  WBC 7.8  --   CREATININE 4.51* 4.70*  LATICACIDVEN 1.8  --     Estimated Creatinine Clearance: 9.5 mL/min (A) (by C-G formula based on SCr of 4.7 mg/dL (H)).    Allergies  Allergen Reactions  . Levofloxacin Itching and Rash  . Tape Other (See Comments)    "Certain surgical tapes peel off my skin"    Antimicrobials this admission: Vancomycin 2/24 >>  Cefepime 2/24 >>   Microbiology results: 2/24 BCx: ordered 2/24 UCx: ordered   Thank you for allowing pharmacy to be a part of this patient's care.  Dimple Nanas, PharmD PGY-1 Acute Care Pharmacy Resident Office: [phone] 03/28/2020 4:51 PM

## 2020-03-28 NOTE — Progress Notes (Signed)
[facility] is following this Code Sepsis. 

## 2020-03-28 NOTE — ED Notes (Signed)
EDP notified of low bp, md to order pts midodrine

## 2020-03-29 ENCOUNTER — Inpatient Hospital Stay (HOSPITAL_BASED_OUTPATIENT_CLINIC_OR_DEPARTMENT_OTHER): Payer: Medicare Other

## 2020-03-29 DIAGNOSIS — I4892 Unspecified atrial flutter: Secondary | ICD-10-CM | POA: Diagnosis not present

## 2020-03-29 DIAGNOSIS — I361 Nonrheumatic tricuspid (valve) insufficiency: Secondary | ICD-10-CM | POA: Diagnosis not present

## 2020-03-29 DIAGNOSIS — N186 End stage renal disease: Secondary | ICD-10-CM

## 2020-03-29 DIAGNOSIS — R6510 Systemic inflammatory response syndrome (SIRS) of non-infectious origin without acute organ dysfunction: Secondary | ICD-10-CM | POA: Diagnosis not present

## 2020-03-29 DIAGNOSIS — R011 Cardiac murmur, unspecified: Secondary | ICD-10-CM

## 2020-03-29 DIAGNOSIS — I951 Orthostatic hypotension: Secondary | ICD-10-CM | POA: Diagnosis not present

## 2020-03-29 LAB — CBC
HCT: 33.4 % — ABNORMAL LOW (ref 36.0–46.0)
Hemoglobin: 10.7 g/dL — ABNORMAL LOW (ref 12.0–15.0)
MCH: 28.5 pg (ref 26.0–34.0)
MCHC: 32 g/dL (ref 30.0–36.0)
MCV: 89.1 fL (ref 80.0–100.0)
Platelets: 215 10*3/uL (ref 150–400)
RBC: 3.75 MIL/uL — ABNORMAL LOW (ref 3.87–5.11)
RDW: 24 % — ABNORMAL HIGH (ref 11.5–15.5)
WBC: 5.4 10*3/uL (ref 4.0–10.5)
nRBC: 0 % (ref 0.0–0.2)

## 2020-03-29 LAB — BASIC METABOLIC PANEL
Anion gap: 17 — ABNORMAL HIGH (ref 5–15)
BUN: 41 mg/dL — ABNORMAL HIGH (ref 6–20)
CO2: 21 mmol/L — ABNORMAL LOW (ref 22–32)
Calcium: 9.7 mg/dL (ref 8.9–10.3)
Chloride: 99 mmol/L (ref 98–111)
Creatinine, Ser: 5.34 mg/dL — ABNORMAL HIGH (ref 0.44–1.00)
GFR, Estimated: 9 mL/min — ABNORMAL LOW (ref >60)
Glucose, Bld: 90 mg/dL (ref 70–99)
Potassium: 4.8 mmol/L (ref 3.5–5.1)
Sodium: 137 mmol/L (ref 135–145)

## 2020-03-29 LAB — ECHOCARDIOGRAM COMPLETE
Area-P 1/2: 2.16 cm2
Height: 59 in
S' Lateral: 2.3 cm
Weight: 1657.86 oz

## 2020-03-29 MED ORDER — CALCITRIOL 0.25 MCG PO CAPS: 1.5000 ug | ORAL_CAPSULE | ORAL | Status: DC

## 2020-03-29 MED ORDER — CHLORHEXIDINE GLUCONATE CLOTH 2 % EX PADS: 6.0000 | MEDICATED_PAD | Freq: Every day | CUTANEOUS | Status: DC

## 2020-03-29 NOTE — Addendum Note (Signed)
Encounter addended by: Micki Riley, RN on: 03/29/2020 11:13 AM  Actions taken: Imaging Exam ended

## 2020-03-29 NOTE — Care Management Obs Status (Deleted)
[facility] NOTIFICATION   Patient Details  Name: Tina Watkins MRN: [id number] Date of Birth: 07/28/1965   Medicare Observation Status Notification Given:  Yes    Angelita Ingles, RN 03/29/2020, 3:06 PM

## 2020-03-29 NOTE — Care Management CC44 (Signed)
Condition Code 44 Documentation Completed  Patient Details  Name: Tina Watkins MRN: [id number] Date of Birth: 1965/03/08   Condition Code 44 given:  Yes Patient signature on Condition Code 44 notice:  Yes Documentation of 2 MD's agreement:  Yes Code 44 added to claim:  Yes    Angelita Ingles, RN 03/29/2020, 3:07 PM

## 2020-03-29 NOTE — Care Management Obs Status (Signed)
[facility] NOTIFICATION   Patient Details  Name: Tina Watkins MRN: [id number] Date of Birth: [birth year]   Medicare Observation Status Notification Given:  Yes    Angelita Ingles, RN 03/29/2020, 2:55 PM

## 2020-03-29 NOTE — Progress Notes (Signed)
  Echocardiogram 2D Echocardiogram has been performed.  Tina Watkins 03/29/2020, 11:28 AM

## 2020-03-29 NOTE — Care Management Obs Status (Deleted)
[facility] NOTIFICATION   Patient Details  Name: Tina Watkins MRN: [id number] Date of Birth: 1965-03-09   Medicare Observation Status Notification Given:  Yes    Angelita Ingles, RN 03/29/2020, 2:56 PM

## 2020-03-29 NOTE — Plan of Care (Signed)
  Problem: Education: Goal: Knowledge of General Education information will improve Description: Including pain rating scale, medication(s)/side effects and non-pharmacologic comfort measures Outcome: Not Progressing   Problem: Health Behavior/Discharge Planning: Goal: Ability to manage health-related needs will improve Outcome: Not Progressing   Problem: Health Behavior/Discharge Planning: Goal: Ability to manage health-related needs will improve Outcome: Not Progressing   Problem: Clinical Measurements: Goal: Ability to maintain clinical measurements within normal limits will improve Outcome: Not Progressing Goal: Will remain free from infection Outcome: Not Progressing Goal: Diagnostic test results will improve Outcome: Not Progressing Goal: Respiratory complications will improve Outcome: Not Progressing Goal: Cardiovascular complication will be avoided Outcome: Not Progressing   Problem: Clinical Measurements: Goal: Will remain free from infection Outcome: Not Progressing   Problem: Clinical Measurements: Goal: Diagnostic test results will improve Outcome: Not Progressing

## 2020-03-29 NOTE — Discharge Summary (Addendum)
Physician Discharge Summary  [name] GXQ:[id number] DOB: 04-Jan-1966 DOA: 03/28/2020  PCP: [name], FNP  Admit date: 03/28/2020 Discharge date: 03/29/2020  Admitted From: Home Disposition: Home  Recommendations for Outpatient Follow-up:  1. Follow ups as below. 2. Please obtain CBC/BMP/Mag at follow up 3. Please follow up on the following pending results: None  Home Health: None required Equipment/Devices: None required  Discharge Condition: Stable CODE STATUS: Full code   Follow-up Information    [name], FNP. Schedule an appointment as soon as possible for a visit in 1 week(s).   Specialty: Family Medicine Contact information: [place] [address] [phone]        Geralynn Rile, MD .   Specialties: Internal Medicine, Cardiology, Radiology Contact information: [place] [address] [phone]        Bensimhon, Shaune Pascal, MD .   Specialty: Cardiology Contact information: [place] [address] [phone]               Hospital Course: 55 year old female with history of ESRD s/p failed renal transplant now on HD TTS, orthostatic hypotension, chronic diastolic CHF, moderate pHTN, HTN and gout brought to ED from outpatient HD with rapid A. fib and fever.  Reportedly had fever with heart rates in 150s after 1 hour of HD session.  She had 20 mg IV Cardizem push.  RVR resolved but she became hypotensive.  In ED, febrile to 100.5.  No leukocytosis.  UA, CXR and CT abdomen and pelvis without significant finding.  She was hypotensive but blood pressure improved with 500 cc bolus.  She was in sinus rhythm.  Blood cultures obtained.  She was a started on broad-spectrum antibiotics for SIRS.  The next day, blood pressure improved.  Remained in sinus rhythm.  Blood cultures NGTD.  TTE without significant finding.  Cleared by nephrology for discharge.   She felt well and asked to go home.  Nephrology to follow-up on further update of her blood culture and address accordingly.  See individual problem list below for more hospital course.  Discharge Diagnoses:  SIRS-no clear source of infection.  Probably mediated by hypotension and brief A. fib.  Blood cultures NGTD.  No respiratory symptoms.  UA reassuring.  HD cath site clean without signs of infection. -Nephrology to follow-up on cultures and address as appropriate  Brief a flutter with RVR-resolved.  Quickly converted to sinus rhythm.  CHA2DS2-VASc score 1-2.  TTE reassuring. -Continue home aspirin Addendum -Ambulatory referral to cardiology ordered.  ESRD on HD TTS-did not complete last session but no indication for urgent or emergent HD per nephrology. -Patient to resume her regular schedule tomorrow.  History of renal transplant -Continue home immunomodulators  Orthostatic hypotension: Chronic issue.  Currently not symptomatic. -Continue home midodrine  Chronic gout: Stable. -Continue home meds   Body mass index is 20.93 kg/m.        Discharge Exam: Vitals:   03/29/20 0554 03/29/20 1018  BP: (!) 81/58 103/70  Pulse: 76 67  Resp: 18   Temp: 98.3 F (36.8 C) 98.9 F (37.2 C)  SpO2: 99% 100%    GENERAL: No apparent distress.  Nontoxic. HEENT: MMM.  Vision and hearing grossly intact.  NECK: Supple.  No apparent JVD.  RESP: On RA.  No IWOB.  Fair aeration bilaterally. CVS:  RRR. Heart sounds normal.  HD catheter on right thigh with no signs of infection. ABD/GI/GU: Bowel sounds  present. Soft. Non tender.  MSK/EXT:  Moves extremities. No apparent deformity. No edema.  SKIN: no apparent skin lesion or wound NEURO: Awake, alert and oriented appropriately.  No apparent focal neuro deficit. PSYCH: Calm. Normal affect.  Discharge Instructions  Discharge Instructions    Ambulatory referral to Cardiology   Complete by: As directed    Call MD for:  difficulty  breathing, headache or visual disturbances   Complete by: As directed    Call MD for:  extreme fatigue   Complete by: As directed    Call MD for:  persistant dizziness or light-headedness   Complete by: As directed    Call MD for:  severe uncontrolled pain   Complete by: As directed    Call MD for:  temperature >100.4   Complete by: As directed    Diet - low sodium heart healthy   Complete by: As directed    Discharge instructions   Complete by: As directed    It has been a pleasure taking care of you!  You were hospitalized due to fever and rapid heart rate that seems to have resolved.  Your blood culture is negative for blood infection so far.  Your symptoms improved.  We are discharging you to follow-up with your doctors outpatient. The nephrologist team will follow up on your blood culture incase the result changes, and manage it as appropriate.  We have also sent a referral to cardiology for further evaluation about your rapid heart rate that has resolved now.    Take care,   Increase activity slowly   Complete by: As directed      Allergies as of 03/29/2020      Reactions   Levofloxacin Itching, Rash   Tape Other (See Comments)   "Certain surgical tapes peel off my skin"      Medication List    TAKE these medications   allopurinol 100 MG tablet Commonly known as: ZYLOPRIM Take 1 tablet (100 mg total) by mouth daily.   aspirin EC 81 MG tablet Take 81 mg by mouth daily.   calcium acetate (Phos Binder) 667 MG/5ML Soln Commonly known as: PHOSLYRA Take by mouth 3 (three) times daily with meals.   Colchicine 0.6 MG Caps Take 0.6 mg by mouth as needed.   diclofenac Sodium 1 % Gel Commonly known as: VOLTAREN Apply 2 g topically 4 (four) times daily.   diphenhydrAMINE 25 mg capsule Commonly known as: BENADRYL Take 1 capsule (25 mg total) by mouth every 8 (eight) hours as needed. What changed: reasons to take this   famotidine 20 MG tablet Commonly known as:  Pepcid Take 1 tablet (20 mg total) by mouth 2 (two) times daily.   gabapentin 100 MG capsule Commonly known as: NEURONTIN Take 100 mg by mouth 2 (two) times daily.   latanoprost 0.005 % ophthalmic solution Commonly known as: XALATAN Place 1 drop into the left eye at bedtime.   lidocaine 5 % Commonly known as: LIDODERM Place 1 patch onto the skin daily as needed (for pain- remove old patch first).   midodrine 10 MG tablet Commonly known as: PROAMATINE Take 1.5 tablets (15 mg total) by mouth 3 (three) times daily.   MIRCERA IJ Mircera   ondansetron 4 MG disintegrating tablet Commonly known as: ZOFRAN-ODT Take 1 tablet (4 mg total) by mouth every 8 (eight) hours as needed for nausea or vomiting.   oxyCODONE-acetaminophen 10-325 MG tablet Commonly known as: PERCOCET Take 1 tablet by mouth 4 (four) times  daily as needed.   predniSONE 5 MG tablet Commonly known as: DELTASONE Take 5 mg by mouth daily with breakfast.   tacrolimus 1 MG capsule Commonly known as: PROGRAF Take 2-3 mg by mouth See admin instructions. Take 3 mg by mouth in the morning and 2 mg at bedtime   Vitamin D (Ergocalciferol) 1.25 MG (50000 UNIT) Caps capsule Commonly known as: DRISDOL TAKE 1 CAPSULE (50,000 UNITS TOTAL) BY MOUTH EVERY 7 (SEVEN) DAYS.   zolpidem 10 MG tablet Commonly known as: AMBIEN Take 1 tablet (10 mg total) by mouth at bedtime as needed for sleep.       Consultations:  Nephrology  Procedures/Studies:  2D Echo on 03/29/2020 1. Left ventricular ejection fraction, by estimation, is 65 to 70%. The  left ventricle has normal function. The left ventricle has no regional  wall motion abnormalities. Left ventricular diastolic parameters were  normal.  2. Right ventricular systolic function is normal. The right ventricular  size is normal. There is normal pulmonary artery systolic pressure.  3. The mitral valve is normal in structure. No evidence of mitral valve  regurgitation. No  evidence of mitral stenosis.  4. The aortic valve is normal in structure. Aortic valve regurgitation is  not visualized. No aortic stenosis is present.  5. The inferior vena cava is normal in size with greater than 50%  respiratory variability, suggesting right atrial pressure of 3 mmHg.    CT ABDOMEN PELVIS WO CONTRAST  Result Date: 03/28/2020 CLINICAL DATA:  Abdominal pain and fever EXAM: CT ABDOMEN AND PELVIS WITHOUT CONTRAST TECHNIQUE: Multidetector CT imaging of the abdomen and pelvis was performed following the standard protocol without oral or IV contrast. COMPARISON:  December 01, 2019; CT abdomen and pelvis May 12, 2019 FINDINGS: Lower chest: There are foci of scarring in portions of the right middle and right lower lobes. There is no edema or consolidation in the visualized lung bases. Hepatobiliary: No focal liver lesions are appreciable on this noncontrast enhanced study. The gallbladder wall is not appreciably thickened. Gallbladder upper normal in size. No biliary duct dilatation is evident. Pancreas: There is no pancreatic mass or inflammatory focus. Spleen: No splenic lesions are evident beyond apparent small splenic granuloma posteriorly. Adrenals/Urinary Tract: Right adrenal appears unremarkable. There is calcification in the left adrenal, stable. Patient has had surgical removal of native kidneys bilaterally. A transplant kidney is noted in the right lower quadrant, unchanged in size and configuration compared to previous study. There is no transplant kidney mass or hydronephrosis. No transplant renal or ureteral calculus evident. Urinary bladder is midline with wall thickness within normal limits. A focus of calcification is noted in the lower right pelvis, a likely failed transplant kidney which has this appearance is stable. Undergone atrophy with calcification. Stomach/Bowel: There is moderate stool throughout the colon. There is no appreciable bowel wall or mesenteric thickening.  No evident bowel obstruction. The terminal ileum appears unremarkable. There is no appreciable free air or portal venous air. Vascular/Lymphatic: No abdominal aortic aneurysm. There is extensive aortic and pelvic arterial vascular calcification. There is a right femoral venous catheter with the tip in the inferior vena cava. There is again noted enlargement of the visualized azygos and hemiazygous venous structures. There remains prominence of several retroperitoneal lymph nodes, stable from previous study. No new lymph node prominence is evident. Reproductive: Uterus is anteverted.  No adnexal masses are evident. Other: Appendix absent. No periappendiceal region inflammation. No abscess or ascites is evident in the abdomen or pelvis.  Musculoskeletal: There is avascular necrosis in the right femoral head region, stable compared to prior study. No blastic or lytic bone lesions are evident. No appreciable abdominal wall or intramuscular lesions. IMPRESSION: 1. Patient has had surgical removal of native kidneys. There is calcification in the right pelvis consistent with failed renal transplant. A renal transplant in the upper right pelvis appear stable compared to most recent studies without hydronephrosis or perinephric fluid. Urinary bladder appears unremarkable. 2. No bowel obstruction. No abscess in the abdomen or pelvis. No periappendiceal region inflammatory change. By report, patient has had appendectomy. 3. There is a right femoral venous catheter with tip in the inferior vena cava. There are prominent azygos and hemiazygous venous structures, a stable finding compared to prior studies. 4. Mild retroperitoneal adenopathy, stable. No new lymph node enlargement. 5. Aortic Atherosclerosis (ICD10-I70.0). Extensive pelvic arterial vascular calcification noted. 6. Calcification in the left adrenal may be indicative of prior inflammation in this area. No adrenal enlargement. 7.  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 Electronically Signed   By: Lowella Grip III M.D.   On: 03/28/2020 15:26   MR Knee Left  Wo Contrast  Result Date: 03/01/2020 CLINICAL DATA:  Chronic knee pain. No known injury. Osteoarthritis suspected. EXAM: MRI OF THE LEFT KNEE WITHOUT CONTRAST TECHNIQUE: Multiplanar, multisequence MR imaging of the knee was performed. No intravenous contrast was administered. COMPARISON:  Radiographs 12/13/2019. FINDINGS: MENISCI Medial meniscus:  Intact with normal morphology. Lateral meniscus:  Discoid configuration without evidence of tear. LIGAMENTS Cruciates:  Intact. Collaterals:  Intact. CARTILAGE Patellofemoral:  Preserved. Medial:  Mild chondral thinning without focal defect. Lateral:  Preserved. MISCELLANEOUS Joint:  No significant joint effusion. Popliteal Fossa:  Unremarkable. No significant Baker's cyst. Extensor Mechanism:  Intact. Bones: There are multiple medullary infarcts, measuring up to 3.9 cm in the tibial metaphysis. There are smaller bone infarcts within both femoral condyles. These infarcts are associated with serpiginous peripheral T2 hyperintensity and appear subacute. There is no resulting subchondral collapse. More proximally in the distal femoral metaphysis, there is an intraosseous lesion with intermediate T1 and T2 signal, measuring up to 2.4 cm on image 14/6, possibly an intraosseous lipoma. This appears nonaggressive. The small focus of periosteal thickening involving the metadiaphysis of the distal femur medially appears benign. Other: Mild generalized subcutaneous edema surrounding the knee. No focal fluid collection. IMPRESSION: 1. 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 These appear subacute and could contribute to knee pain. No resulting subchondral collapse. 2. The menisci, cruciate and collateral ligaments are intact. 3. Mild generalized subcutaneous edema surrounding the knee. Electronically Signed   By:  Richardean Sale M.D.   On: 03/01/2020 11:28   DG Chest Portable 1 View  Result Date: 03/28/2020 CLINICAL DATA:  Shortness of breath with fever and tachycardia EXAM: PORTABLE CHEST 1 VIEW COMPARISON:  December 12, 2019 FINDINGS: Lungs clear. Heart size within normal limits. Superior vena cava stent unchanged in position. No evident adenopathy. Old healed fracture of the right posterior sixth rib. Surgical clips noted in upper abdomen. IMPRESSION: Lungs clear. Stable cardiac silhouette. Stent in superior vena cava unchanged in position. Electronically Signed   By: Lowella Grip III M.D.   On: 03/28/2020 14:09   ECHOCARDIOGRAM COMPLETE  Result Date: 03/29/2020    ECHOCARDIOGRAM REPORT   Patient Name:   Tina Watkins Date of Exam: 03/29/2020 Medical Rec #:  [id number]  Height:       59.0 in Accession #:    [id number]               Weight:       103.6 lb Date of Birth:  1965-02-23                BSA:          1.395 m Patient Age:    55 years                 BP:           103/70 mmHg Patient Gender: F                        HR:           58 bpm. Exam Location:  Inpatient Procedure: 2D Echo Indications:    Murmur R01.1  History:        Patient has prior history of Echocardiogram examinations, most                 recent 01/25/2019. CHF; Arrythmias:Atrial Fibrillation.  Sonographer:    Mikki Santee RDCS (AE) Referring Phys: [id number] Lequita Halt IMPRESSIONS  1. Left ventricular ejection fraction, by estimation, is 65 to 70%. The left ventricle has normal function. The left ventricle has no regional wall motion abnormalities. Left ventricular diastolic parameters were normal.  2. Right ventricular systolic function is normal. The right ventricular size is normal. There is normal pulmonary artery systolic pressure.  3. The mitral valve is normal in structure. No evidence of mitral valve regurgitation. No evidence of mitral stenosis.  4. The aortic valve is normal in structure. Aortic  valve regurgitation is not visualized. No aortic stenosis is present.  5. The inferior vena cava is normal in size with greater than 50% respiratory variability, suggesting right atrial pressure of 3 mmHg. FINDINGS  Left Ventricle: Left ventricular ejection fraction, by estimation, is 65 to 70%. The left ventricle has normal function. The left ventricle has no regional wall motion abnormalities. The left ventricular internal cavity size was normal in size. There is  no left ventricular hypertrophy. Left ventricular diastolic parameters were normal. Right Ventricle: The right ventricular size is normal. No increase in right ventricular wall thickness. Right ventricular systolic function is normal. There is normal pulmonary artery systolic pressure. The tricuspid regurgitant velocity is 2.14 m/s, and  with an assumed right atrial pressure of 3 mmHg, the estimated right ventricular systolic pressure is 53.6 mmHg. Left Atrium: Left atrial size was normal in size. Right Atrium: Right atrial size was normal in size. Pericardium: There is no evidence of pericardial effusion. Mitral Valve: The mitral valve is normal in structure. No evidence of mitral valve regurgitation. No evidence of mitral valve stenosis. Tricuspid Valve: The tricuspid valve is normal in structure. Tricuspid valve regurgitation is mild . No evidence of tricuspid stenosis. Aortic Valve: The aortic valve is normal in structure. Aortic valve regurgitation is not visualized. No aortic stenosis is present. Pulmonic Valve: The pulmonic valve was normal in structure. Pulmonic valve regurgitation is not visualized. No evidence of pulmonic stenosis. Aorta: The aortic root is normal in size and structure. Venous: The inferior vena cava is normal in size with greater than 50% respiratory variability, suggesting right atrial pressure of 3 mmHg. IAS/Shunts: No atrial level shunt detected by color flow Doppler.  LEFT VENTRICLE PLAX 2D LVIDd:  3.90 cm   Diastology LVIDs:         2.30 cm  LV e' medial:    3.90 cm/s LV PW:         0.90 cm  LV E/e' medial:  30.5 LV IVS:        1.00 cm  LV e' lateral:   5.55 cm/s LVOT diam:     2.00 cm  LV E/e' lateral: 21.4 LV SV:         88 LV SV Index:   63 LVOT Area:     3.14 cm  RIGHT VENTRICLE RV S prime:     13.00 cm/s TAPSE (M-mode): 2.2 cm LEFT ATRIUM             Index       RIGHT ATRIUM          Index LA diam:        2.30 cm 1.65 cm/m  RA Area:     9.76 cm LA Vol (A2C):   29.8 ml 21.37 ml/m RA Volume:   19.80 ml 14.20 ml/m LA Vol (A4C):   42.2 ml 30.26 ml/m LA Biplane Vol: 36.3 ml 26.03 ml/m  AORTIC VALVE LVOT Vmax:   134.00 cm/s LVOT Vmean:  89.500 cm/s LVOT VTI:    0.279 m  AORTA Ao Root diam: 2.80 cm MITRAL VALVE                TRICUSPID VALVE MV Area (PHT): 2.16 cm     TR Peak grad:   18.3 mmHg MV Decel Time: 352 msec     TR Vmax:        214.00 cm/s MV E velocity: 119.00 cm/s MV A velocity: 46.00 cm/s   SHUNTS MV E/A ratio:  2.59         Systemic VTI:  0.28 m                             Systemic Diam: 2.00 cm Candee Furbish MD Electronically signed by Candee Furbish MD Signature Date/Time: 03/29/2020/2:24:11 PM    Final         The results of significant diagnostics from this hospitalization (including imaging, microbiology, ancillary and laboratory) are listed below for reference.     Microbiology: Recent Results (from the past 240 hour(s))  Resp Panel by RT-PCR (Flu A&B, Covid) Nasopharyngeal Swab     Status: None   Collection Time: 03/28/20  1:38 PM   Specimen: Nasopharyngeal Swab; Nasopharyngeal(NP) swabs in vial transport medium  Result Value Ref Range Status   SARS Coronavirus 2 by RT PCR NEGATIVE NEGATIVE Final    Comment: (NOTE) SARS-CoV-2 target nucleic acids are NOT DETECTED.  The SARS-CoV-2 RNA is generally detectable in upper respiratory specimens during the acute phase of infection. The lowest concentration of SARS-CoV-2 viral copies this assay can detect is 138 copies/mL. A negative  result does not preclude SARS-Cov-2 infection and should not be used as the sole basis for treatment or other patient management decisions. A negative result may occur with  improper specimen collection/handling, submission of specimen other than nasopharyngeal swab, presence of viral mutation(s) within the areas targeted by this assay, and inadequate number of viral copies(<138 copies/mL). A negative result must be combined with clinical observations, patient history, and epidemiological information. The expected result is Negative.  Fact Sheet for Patients:  EntrepreneurPulse.com.au  Fact Sheet for Healthcare Providers:  IncredibleEmployment.be  This test is no  t yet approved or cleared by the [place] and  has been authorized for detection and/or diagnosis of SARS-CoV-2 by FDA under an Emergency Use Authorization (EUA). This EUA will remain  in effect (meaning this test can be used) for the duration of the COVID-19 declaration under Section 564(b)(1) of the Act, 21 U.S.C.section 360bbb-3(b)(1), unless the authorization is terminated  or revoked sooner.       Influenza A by PCR NEGATIVE NEGATIVE Final   Influenza B by PCR NEGATIVE NEGATIVE Final    Comment: (NOTE) The Xpert Xpress SARS-CoV-2/FLU/RSV plus assay is intended as an aid in the diagnosis of influenza from Nasopharyngeal swab specimens and should not be used as a sole basis for treatment. Nasal washings and aspirates are unacceptable for Xpert Xpress SARS-CoV-2/FLU/RSV testing.  Fact Sheet for Patients: EntrepreneurPulse.com.au  Fact Sheet for Healthcare Providers: IncredibleEmployment.be  This test is not yet approved or cleared by the [place] FDA and has been authorized for detection and/or diagnosis of SARS-CoV-2 by FDA under an Emergency Use Authorization (EUA). This EUA will remain in effect (meaning this test can be used)  for the duration of the COVID-19 declaration under Section 564(b)(1) of the Act, 21 U.S.C. section 360bbb-3(b)(1), unless the authorization is terminated or revoked.  Performed at [facility], [place] [address], [place], [place] [address]   Blood Culture (routine x 2)     Status: None (Preliminary result)   Collection Time: 03/28/20  1:38 PM   Specimen: BLOOD  Result Value Ref Range Status   Specimen Description BLOOD SITE NOT SPECIFIED  Final   Special Requests   Final    BOTTLES DRAWN AEROBIC AND ANAEROBIC Blood Culture results may not be optimal due to an inadequate volume of blood received in culture bottles   Culture   Final    NO GROWTH < 24 HOURS Performed at [facility], [place] [address] [place], [place] [address]    Report Status PENDING  Incomplete  Blood Culture (routine x 2)     Status: None (Preliminary result)   Collection Time: 03/28/20  6:58 PM   Specimen: BLOOD  Result Value Ref Range Status   Specimen Description BLOOD SITE NOT SPECIFIED  Final   Special Requests   Final    BOTTLES DRAWN AEROBIC ONLY Blood Culture adequate volume   Culture   Final    NO GROWTH < 24 HOURS Performed at [facility], [place] [address] [place], [place] [address]    Report Status PENDING  Incomplete     Labs:  CBC: Recent Labs  Lab 03/28/20 1330 03/28/20 1347 03/29/20 0309  WBC 7.8  --  5.4  NEUTROABS 6.6  --   --   HGB 11.9* 12.6 10.7*  HCT 37.2 37.0 33.4*  MCV 88.6  --  89.1  PLT 226  --  215   BMP &GFR Recent Labs  Lab 03/28/20 1330 03/28/20 1347 03/29/20 0309  NA 136 135 137  K 3.8 3.8 4.8  CL 96* 97* 99  CO2 22  --  21*  GLUCOSE 80 80 90  BUN 31* 34* 41*  CREATININE 4.51* 4.70* 5.34*  CALCIUM 9.5  --  9.7   Estimated Creatinine Clearance: 8.4 mL/min (A) (by C-G formula based on SCr of 5.34 mg/dL (H)). Liver & Pancreas: Recent Labs  Lab 03/28/20 1330  AST 25  ALT 20  ALKPHOS 161*  BILITOT 0.8  PROT 7.8  ALBUMIN 4.1  Recent  Labs  Lab 03/28/20 1330  LIPASE 73*   No results for input(s): AMMONIA in the last 168 hours. Diabetic: No results for input(s): HGBA1C in the last 72 hours. No results for input(s): GLUCAP in the last 168 hours. Cardiac Enzymes: No results for input(s): CKTOTAL, CKMB, CKMBINDEX, TROPONINI in the last 168 hours. No results for input(s): PROBNP in the last 8760 hours. Coagulation Profile: Recent Labs  Lab 03/28/20 1330  INR 1.1   Thyroid Function Tests: No results for input(s): TSH, T4TOTAL, FREET4, T3FREE, THYROIDAB in the last 72 hours. Lipid Profile: No results for input(s): CHOL, HDL, LDLCALC, TRIG, CHOLHDL, LDLDIRECT in the last 72 hours. Anemia Panel: No results for input(s): VITAMINB12, FOLATE, FERRITIN, TIBC, IRON, RETICCTPCT in the last 72 hours. Urine analysis:    Component Value Date/Time   COLORURINE YELLOW 03/28/2020 1619   APPEARANCEUR CLEAR 03/28/2020 1619   LABSPEC 1.013 03/28/2020 1619   PHURINE 7.0 03/28/2020 1619   GLUCOSEU NEGATIVE 03/28/2020 1619   HGBUR NEGATIVE 03/28/2020 1619   BILIRUBINUR NEGATIVE 03/28/2020 1619   BILIRUBINUR Negative 01/04/2019 1030   KETONESUR 5 (A) 03/28/2020 1619   PROTEINUR 100 (A) 03/28/2020 1619   UROBILINOGEN 0.2 01/04/2019 1030   UROBILINOGEN 0.2 10/15/2014 0946   NITRITE NEGATIVE 03/28/2020 1619   LEUKOCYTESUR NEGATIVE 03/28/2020 1619   Sepsis Labs: Invalid input(s): PROCALCITONIN, LACTICIDVEN   Time coordinating discharge: 35 minutes  SIGNED:  Mercy Riding, MD  [facility] 03/29/2020, 2:43 PM  If 7PM-7AM, please contact night-coverage [email or web address]

## 2020-03-29 NOTE — Consult Note (Signed)
[facility] Renal Consultation Note    Indication for Consultation:  Management of ESRD/hemodialysis; anemia, hypertension/volume and secondary hyperparathyroidism  AYT:KZSWFU, Ellie Lunch, FNP  HPI: Tina Watkins is a 55 y.o. 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 Currently on HD at [facility] on MWF.  Additional past medical history significant for hypotension on midodrine, gastroparesis, gout, chronic diastolic CHF, moderate pulmonary HTN, R eye blindness and chronic steroid use/immunosuppression.    Patient seen and examined at bedside.  Presented to the ED 1 day ago due to A fib with RVR which began at dialysis.  Patient reports heart rate started to rise at dialysis and continued to elevate as high as 200 so she was rinsed back and EMS called.  Completed 1 hour of her treatment.  States she felt like her heart was pounding.  Reports feeling normal prior to dialysis except for a flare of her gastroparesis in the days prior.  Had n/v the 2 days prior and came into dialysis at her dry weight.  Today she is feeling back to normal.  Denies CP, palpitations, n/v/d, abdominal pain, weakness, dizziness and fatigue.  Reports increased anxiety lately, previously on xanax but no longer taking.  PCP recently started a different medication which she has not started.   Pertinent findings in work up include tachycardia, t max 100.5, hypotension, CXR with no acute findings and CT abdomen with no acute findings.  Patient has been admitted for further evaluation and management.   Past Medical History:  Diagnosis Date  . Bacteremia due to Gram-negative bacteria 05/23/2011  . Blind    right eye  . Blind right eye   . CHF (congestive heart failure) ([place])   . Chronic lower back pain   . Complication of anesthesia    "woke up during OR; I have an extremely  high tolerance" (12/11/2011) 1 procedure was graft; the other procedures were procedures that are typically done with sedation.  . DDD (degenerative disc disease), cervical   . Depression   . Dysrhythmia    "tachycardia" (12/11/2011) new onset afib 10/15/14 EKG  . E coli bacteremia 06/18/2011  . Elevated LDL cholesterol level 12/2018  . ESRD (end stage renal disease) ([place]) 06/12/2011  . Fibromyalgia   . Gastroparesis   . Gastroparesis   . GERD (gastroesophageal reflux disease)   . Glaucoma    right eye  . Gout   . H/O carpal tunnel syndrome   . Headache 10/2019  . Headache(784.0)    "not often anymore" (12/11/2011)  . Herpes genitalia 1994  . History of blood transfusion    "more than a few times" (12/11/2011)  . History of stomach ulcers   . Hypotension   . Iron deficiency anemia   . New onset a-fib ([place])    10/15/14 EKG  . Osteopenia   . Peripheral neuropathy 11/2018  . Pressure ulcer of sacral region, stage 1 07/2019  . Seizures ([place]) 1994   "post transplant; only have had that one" (12/11/2011)  . Spinal stenosis in cervical region   . Stroke [facility])     left basal ganglia lacunar infarct; Right frontal lobe lacunar infarct.  . Stroke [facility]) ~ 1999; 2001   "briefly lost my vision; lost my right eye" (12/11/2011)  . Vitamin D deficiency 12/2018   Past Surgical History:  Procedure Laterality Date  . ANTERIOR CERVICAL DECOMP/DISCECTOMY  FUSION N/A 01/08/2015   Procedure: Anterior Cervical Three-Four/Four-Five Decompression/Diskectomy/Fusion;  Surgeon: Leeroy Cha, MD;  Location: [place] NEURO ORS;  Service: Neurosurgery;  Laterality: N/A;  C3-4 C4-5 Anterior cervical decompression/diskectomy/fusion  . APPENDECTOMY  ~ 2004  . CATARACT EXTRACTION     right eye  . COLONOSCOPY    . ENUCLEATION  2001   "right"  . ESOPHAGOGASTRODUODENOSCOPY (EGD) WITH PROPOFOL N/A 04/21/2012   Procedure: ESOPHAGOGASTRODUODENOSCOPY (EGD) WITH PROPOFOL;  Surgeon: Milus Banister, MD;  Location: [facility]  ENDOSCOPY;  Service: Endoscopy;  Laterality: N/A;  . 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 IR FLUORO GUIDE CV LINE RIGHT  07/11/2019  . IR FLUORO GUIDE CV LINE RIGHT  10/20/2019  . IR RADIOLOGY PERIPHERAL GUIDED IV START  07/11/2019  . IR US GUIDE VASC ACCESS RIGHT  07/11/2019  . IR US GUIDE VASC ACCESS RIGHT  07/11/2019  . IR US GUIDE VASC ACCESS RIGHT  07/11/2019  . [place]; 1999; 2005   "right"  . MULTIPLE TOOTH EXTRACTIONS    . RIGHT HEART CATH N/A 03/23/2019   Procedure: RIGHT HEART CATH;  Surgeon: Jolaine Artist, MD;  Location: [place] CV LAB;  Service: Cardiovascular;  Laterality: N/A;  . TONSILLECTOMY    . TOTAL NEPHRECTOMY  1988?; 1994; 2005   Family History  Problem Relation Age of Onset  . Glaucoma Mother   . Pancreatic cancer Father   . Multiple sclerosis Brother   . Hypertension Maternal Grandmother   . Breast cancer Neg Hx    Social History:  reports that she has been smoking cigarettes. She has a 11.55 pack-year smoking history. She has never used smokeless tobacco. She reports current alcohol use. She reports current drug use. Frequency: 1.00 time per week. Drug: Marijuana. Allergies  Allergen Reactions  . Levofloxacin Itching and Rash  . Tape Other (See Comments)    "Certain surgical tapes peel off my skin"   Prior to Admission medications   Medication Sig Start Date End Date Taking? Authorizing Provider  allopurinol (ZYLOPRIM) 100 MG tablet Take 1 tablet (100 mg total) by mouth daily. 01/13/20  Yes [name], FNP  aspirin EC 81 MG tablet Take 81 mg by mouth daily.    Yes [provider]  calcium acetate, Phos Binder, (PHOSLYRA) 667 MG/5ML SOLN Take by mouth 3 (three) times daily with meals.   Yes [provider]  Colchicine 0.6 MG CAPS Take 0.6 mg by mouth as needed.    [provider]  diclofenac Sodium (VOLTAREN) 1 % GEL Apply 2 g topically 4  (four) times daily. 03/13/20   Leandrew Koyanagi, MD  diphenhydrAMINE (BENADRYL) 25 mg capsule Take 1 capsule (25 mg total) by mouth every 8 (eight) hours as needed. Patient taking differently: Take 25 mg by mouth every 8 (eight) hours as needed for itching. 07/21/19   [name], FNP  famotidine (PEPCID) 20 MG tablet Take 1 tablet (20 mg total) by mouth 2 (two) times daily. 06/13/19   [name], FNP  gabapentin (NEURONTIN) 100 MG capsule Take 100 mg by mouth 2 (two) times daily.  09/27/18   [provider]  latanoprost (XALATAN) 0.005 % ophthalmic solution Place 1 drop into the left eye at bedtime. 03/30/17   [provider]  lidocaine (LIDODERM) 5 % Place 1 patch onto the skin daily as needed (for pain- remove old patch  first).  10/28/18   [provider]  Methoxy PEG-Epoetin Beta (MIRCERA IJ) Mircera 09/14/19 09/12/20  [provider]  midodrine (PROAMATINE) 10 MG tablet Take 1.5 tablets (15 mg total) by mouth 3 (three) times daily. 03/22/20   Bensimhon, Shaune Pascal, MD  ondansetron (ZOFRAN-ODT) 4 MG disintegrating tablet Take 1 tablet (4 mg total) by mouth every 8 (eight) hours as needed for nausea or vomiting. 06/13/19   [name], FNP  oxyCODONE-acetaminophen (PERCOCET) 10-325 MG tablet Take 1 tablet by mouth 4 (four) times daily as needed.  09/08/19   [provider]  predniSONE (DELTASONE) 5 MG tablet Take 5 mg by mouth daily with breakfast.    [provider]  tacrolimus (PROGRAF) 1 MG capsule Take 2-3 mg by mouth See admin instructions. Take 3 mg by mouth in the morning and 2 mg at bedtime 06/22/11   [name], MD  Vitamin D, Ergocalciferol, (DRISDOL) 1.25 MG (50000 UNIT) CAPS capsule TAKE 1 CAPSULE (50,000 UNITS TOTAL) BY MOUTH EVERY 7 (SEVEN) DAYS. 01/17/20   [name], FNP  zolpidem (AMBIEN) 10 MG tablet Take 1 tablet (10 mg total) by mouth at bedtime as needed for sleep. 08/24/19   [name], DO   Current  Facility-Administered Medications  Medication Dose Route Frequency Provider Last Rate Last Admin  . allopurinol (ZYLOPRIM) tablet 100 mg  100 mg Oral Daily Wynetta Fines T, MD   100 mg at 03/29/20 1761  . aspirin EC tablet 81 mg  81 mg Oral Daily Wynetta Fines T, MD   81 mg at 03/29/20 6073  . calcium acetate (PHOSLO) capsule 667 mg  667 mg Oral TID WC Lequita Halt, MD   667 mg at 03/29/20 0843  . ceFEPIme (MAXIPIME) 1 g in sodium chloride 0.9 % 100 mL IVPB  1 g Intravenous Q24H Wynetta Fines T, MD      . diclofenac Sodium (VOLTAREN) 1 % topical gel 2 g  2 g Topical QID Wynetta Fines T, MD   2 g at 03/29/20 [phone]  . famotidine (PEPCID) tablet 20 mg  20 mg Oral BID Wynetta Fines T, MD   20 mg at 03/29/20 [phone]  . gabapentin (NEURONTIN) capsule 100 mg  100 mg Oral BID Wynetta Fines T, MD   100 mg at 03/29/20 0842  . heparin injection 5,000 Units  5,000 Units Subcutaneous Q8H Wynetta Fines T, MD   5,000 Units at 03/29/20 0529  . hydrocortisone sodium succinate (SOLU-CORTEF) 100 MG injection 50 mg  50 mg Intravenous Q6H Wynetta Fines T, MD   50 mg at 03/29/20 [phone]  . latanoprost (XALATAN) 0.005 % ophthalmic solution 1 drop  1 drop Left Eye QHS Lequita Halt, MD   1 drop at 03/28/20 2041  . metoCLOPramide (REGLAN) injection 10 mg  10 mg Intravenous Q6H Wynetta Fines T, MD   10 mg at 03/29/20 0529  . midodrine (PROAMATINE) tablet 15 mg  15 mg Oral TID Lequita Halt, MD   15 mg at 03/29/20 0843  . ondansetron (ZOFRAN-ODT) disintegrating tablet 4 mg  4 mg Oral Q8H PRN Wynetta Fines T, MD      . Derrill Memo ON 03/30/2020] vancomycin (VANCOCIN) IVPB 500 mg/100 ml premix  500 mg Intravenous Q T,Th,Sa-HD Wynetta Fines T, MD      . zolpidem [facility]) tablet 5 mg  5 mg Oral QHS PRN Lequita Halt, MD       Labs: Basic Metabolic Panel: Recent Labs  Lab 03/28/20 1330 03/28/20 1347 03/29/20 0309  NA 136 135 137  K 3.8 3.8 4.8  CL 96* 97* 99  CO2 22  --  21*  GLUCOSE 80 80 90  BUN 31* 34* 41*  CREATININE 4.51* 4.70* 5.34*  CALCIUM  9.5  --  9.7   Liver Function Tests: Recent Labs  Lab 03/28/20 1330  AST 25  ALT 20  ALKPHOS 161*  BILITOT 0.8  PROT 7.8  ALBUMIN 4.1   Recent Labs  Lab 03/28/20 1330  LIPASE 73*   CBC: Recent Labs  Lab 03/28/20 1330 03/28/20 1347 03/29/20 0309  WBC 7.8  --  5.4  NEUTROABS 6.6  --   --   HGB 11.9* 12.6 10.7*  HCT 37.2 37.0 33.4*  MCV 88.6  --  89.1  PLT 226  --  215   Studies/Results: CT ABDOMEN PELVIS WO CONTRAST  Result Date: 03/28/2020 CLINICAL DATA:  Abdominal pain and fever EXAM: CT ABDOMEN AND PELVIS WITHOUT CONTRAST TECHNIQUE: Multidetector CT imaging of the abdomen and pelvis was performed following the standard protocol without oral or IV contrast. COMPARISON:  December 01, 2019; CT abdomen and pelvis May 12, 2019 FINDINGS: Lower chest: There are foci of scarring in portions of the right middle and right lower lobes. There is no edema or consolidation in the visualized lung bases. Hepatobiliary: No focal liver lesions are appreciable on this noncontrast enhanced study. The gallbladder wall is not appreciably thickened. Gallbladder upper normal in size. No biliary duct dilatation is evident. Pancreas: There is no pancreatic mass or inflammatory focus. Spleen: No splenic lesions are evident beyond apparent small splenic granuloma posteriorly. Adrenals/Urinary Tract: Right adrenal appears unremarkable. There is calcification in the left adrenal, stable. Patient has had surgical removal of native kidneys bilaterally. A transplant kidney is noted in the right lower quadrant, unchanged in size and configuration compared to previous study. There is no transplant kidney mass or hydronephrosis. No transplant renal or ureteral calculus evident. Urinary bladder is midline with wall thickness within normal limits. A focus of calcification is noted in the lower right pelvis, a likely failed transplant kidney which has this appearance is stable. Undergone atrophy with calcification.  Stomach/Bowel: There is moderate stool throughout the colon. There is no appreciable bowel wall or mesenteric thickening. No evident bowel obstruction. The terminal ileum appears unremarkable. There is no appreciable free air or portal venous air. Vascular/Lymphatic: No abdominal aortic aneurysm. There is extensive aortic and pelvic arterial vascular calcification. There is a right femoral venous catheter with the tip in the inferior vena cava. There is again noted enlargement of the visualized azygos and hemiazygous venous structures. There remains prominence of several retroperitoneal lymph nodes, stable from previous study. No new lymph node prominence is evident. Reproductive: Uterus is anteverted.  No adnexal masses are evident. Other: Appendix absent. No periappendiceal region inflammation. No abscess or ascites is evident in the abdomen or pelvis. Musculoskeletal: There is avascular necrosis in the right femoral head region, stable compared to prior study. No blastic or lytic bone lesions are evident. No appreciable abdominal wall or intramuscular lesions. IMPRESSION: 1. Patient has had surgical removal of native kidneys. There is calcification in the right pelvis consistent with failed renal transplant. A renal transplant in the upper right pelvis appear stable compared to most recent studies without hydronephrosis or perinephric fluid. Urinary bladder appears unremarkable. 2. No bowel obstruction. No abscess in the abdomen or pelvis. No periappendiceal region inflammatory change. By report, patient has had  appendectomy. 3. There is a right femoral venous catheter with tip in the inferior vena cava. There are prominent azygos and hemiazygous venous structures, a stable finding compared to prior studies. 4. Mild retroperitoneal adenopathy, stable. No new lymph node enlargement. 5. Aortic Atherosclerosis (ICD10-I70.0). Extensive pelvic arterial vascular calcification noted. 6. Calcification in the left adrenal  may be indicative of prior inflammation in this area. No adrenal enlargement. 7.  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 Electronically Signed   By: Lowella Grip III M.D.   On: 03/28/2020 15:26   DG Chest Portable 1 View  Result Date: 03/28/2020 CLINICAL DATA:  Shortness of breath with fever and tachycardia EXAM: PORTABLE CHEST 1 VIEW COMPARISON:  December 12, 2019 FINDINGS: Lungs clear. Heart size within normal limits. Superior vena cava stent unchanged in position. No evident adenopathy. Old healed fracture of the right posterior sixth rib. Surgical clips noted in upper abdomen. IMPRESSION: Lungs clear. Stable cardiac silhouette. Stent in superior vena cava unchanged in position. Electronically Signed   By: Lowella Grip III M.D.   On: 03/28/2020 14:09    ROS: All others negative except those listed in HPI.  Physical Exam: Vitals:   03/28/20 1730 03/28/20 1854 03/29/20 0554 03/29/20 1018  BP: 117/75 90/60 (!) 81/58 103/70  Pulse: 84 75 76 67  Resp: 18 20 18    Temp:  98.1 F (36.7 C) 98.3 F (36.8 C) 98.9 F (37.2 C)  TempSrc:  Oral  Oral  SpO2: 98% 100% 99% 100%  Weight:      Height:         General: chronically ill appearing female in NAD Head: NCAT, R eye blindness sclera not icteric MMM Neck: Supple. No JVD Lungs: CTA bilaterally. No wheeze, rales or rhonchi. Breathing is unlabored. Heart: RRR. +1/9 systolic murmur, no rubs or gallops.  Abdomen: soft, nontender, +BS, no guarding, no rebound tenderness Lower extremities:no edema, small ulcer midway up on L tibia  Neuro: AAOx3. Moves all extremities spontaneously. Psych:  Responds to questions appropriately with a normal affect. Dialysis Access: R femoral TDC c/d/i - no erythema  Dialysis Orders:  TTS - 4hrs  4hrs, BFR 400, DFR 500,  EDW 47kg, 2K/ 2Ca  Access: L femoral TDC  Heparin 2000 Mircera 100 mcg q2wks - last 2/24 Calcitriol 1.14mcg PO qHD  Assessment/Plan: 1.  Tachycardia/A fib w/RVR -  Resolved with Cardizem.  Now NSR. Long term heart monitor ordered.  2. ?Sepsis - fever, tachycardia.  Etiology unclear.  ABX started. BC/UC pending.  ECHO ordered. Per primary.  3.  ESRD -  On HD TTS.  Partial treatment completed yesterday.  Labs stable. No indication for urgent HD.  Plan for HD tomorrow per regular schedule.  Can be completed at OP unit if discharged.  4.  Hypotension/volume  - chronic hypotension on 15mg  midodrine TID.  Euvolemic on exam.  UF to dry weight.  5.  Anemia of CKD - Hgb 10.7.  ESA just dosed yesterday.  6.  Secondary Hyperparathyroidism -  Calcium in goal. Will check phos. Continue binders and VDRA.   7.  Nutrition - Renal diet w/fluid restrictions.  8. Gastroparesis - per primary 9. Gout  10. Dispo - ok for d/c from renal standpoint.  Can complete dialysis at outpatient unit tomorrow.   Jen Mow, PA-C [facility] 03/29/2020, 12:00 PM

## 2020-03-30 ENCOUNTER — Encounter: Payer: Self-pay | Admitting: Student

## 2020-03-30 ENCOUNTER — Telehealth (HOSPITAL_COMMUNITY): Payer: Self-pay | Admitting: Nephrology

## 2020-03-30 LAB — BLOOD CULTURE ID PANEL (REFLEXED) - BCID2

## 2020-03-30 LAB — CULTURE, BLOOD (ROUTINE X 2)

## 2020-03-30 LAB — URINE CULTURE

## 2020-03-30 NOTE — Progress Notes (Signed)
Notified by micro lab about patient's blood culture growing MICROCOCCUS LUTEUS/LYLAE in 1/4 bottles (aerobic). Discussed with ID, Dr. Juleen China who felt this is contaminant.

## 2020-03-30 NOTE — Telephone Encounter (Signed)
Transition of care contact from inpatient facility  Date of discharge: 03/29/2020 Date of contact: 03/30/2020 Method: Phone Spoke to: Patient  Patient contacted to discuss transition of care from recent inpatient hospitalization. Patient was admitted to [facility] from 2/24-2/25/2022 with discharge diagnosis of SIRS and A-flutter. Infectious work-up was negative initially, Blood Cx now growing 1 of 2 + for micrococcus luteus. Looks like this is considered skin flora bacteria -?contaminant. Pt reports she feels well, she is on HD now. Will have her clinic draw another set of cultures just to be sure, but to hold off on abx for now.  Medication changes were reviewed. She has all her meds.  Patient will follow up with his/her outpatient HD unit on: TTS schedule. She is there now.  Veneta Penton, PA-C [employer] Pager [phone]

## 2020-03-31 IMAGING — DX DG CHEST 1V PORT
1 series · 1 of 1 positions shown · non-contrast
Comparison: CT chest 08/10/2017.  Chest x-ray 08/09/2017

CLINICAL DATA: Cough and fever.

EXAM:
PORTABLE CHEST 1 VIEW

[chest]
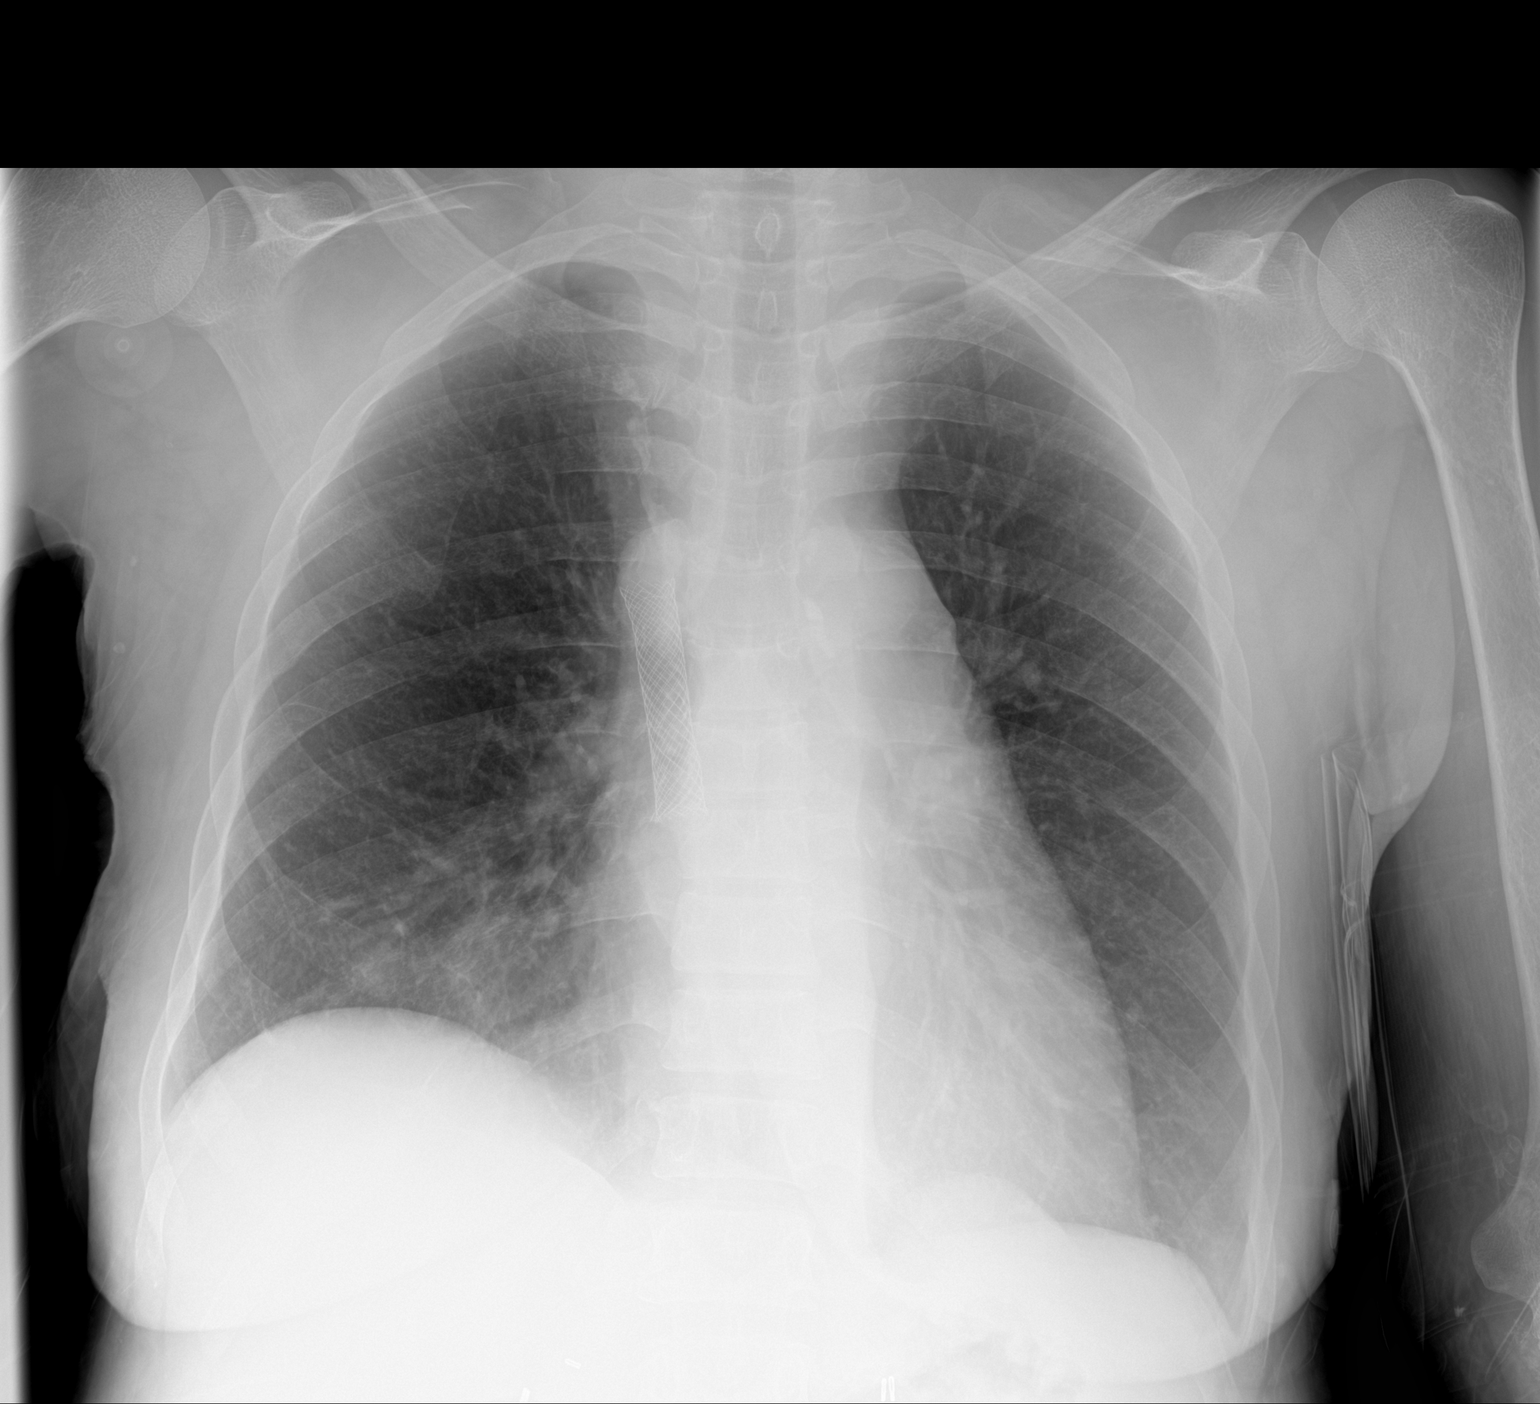

[1 of 1 positions shown; findings below may reference images not displayed]

FINDINGS: Heart size normal. Negative for heart failure or edema. Mild right
middle lobe airspace disease has improved from prior studies and
likely is chronic. SVC stent unchanged in position.
IMPRESSION: Mild right middle lobe airspace disease with interval improvement
likely chronic lung disease based on CT. Negative for edema or
effusion. SVC stent unchanged

## 2020-04-01 NOTE — Progress Notes (Signed)
ADVANCED HF CLINIC NOTE  Primary Cardiologist: Dr. Audie Box  Nephrologist: Dr. Royce Macadamia HF Cardiologist: Dr. Haroldine Laws   HPI: Tina Watkins is a 55 y.o. female with a hx of gout, gastroparesis, CVA, ESRD status post renal transplant, heart failure with preserved ejection fraction, hypertension.  In 12/21, gained a lot of fluid (~30 pounds) over the holidays and was referred to Dr. Audie Box. He recommended IV diuresis but she did not want to be admitted. Saw Dr. Marval Regal who switched to torsemide (likely 20 mg bid). No real effect. Weight down 3 pounds 130 -> 127 (baseline 112 lbs).   Admitted to the hospital 2/15-19/21 for volume overload. Did very well with IV diuresis. Weigh down to 119 pounds. She did bump creatinine to 3.6 so diuretics held for a short time. RHC attempted but could not complete due to access challenges and inability to get into the PA. RA pressure was 3 and CO was normal. There was evidence of multiple venous narrowings in the IVC.   Follow up in AHF clinic 11/21 for pre-op surgical risk stratification for PD catheter. Started on HD over the summer. Says fluid has been ok. Says BP low at HD frequently and taking midodrine. Feels fatigued. No CP. I spoke to Dr. Royce Macadamia by phone and she reports that weight has been stable but suspect she has lost muscle mass due to poor appetite and recent illnesses. She has run out of access sites and needs PD. Midodrine increased to 15 mg tid and compression hose ordered for hypotension.  Admission 2/24-2/25/22 for atrial fibrillation with RVR. Sent from HD, resolved with IV diltiazem, but complicated by subsequent hypotension. Repeat echo EF 65-70%, RV ok.  Today she returns for HF follow up. Last visit was having fast palpitations during HD, resolved with xanax. Zio placed and stress test ordered. Has since had an overnight hospitalization for afib with rvr, resolved with IV diltiazem. Overall feeling fine now. Denies increasing  SOB, CP, dizziness, edema, or PND/Orthopnea. Appetite ok. No fever or chills. Weight at home 104 pounds. Taking all medications. Has not started PD yet, says she will if she has no other choice. HD access in R groin. Accepted for kidney transplant work up at [place] [facility] [place] [facility] [place].  ROS: All systems reviewed and negative except as per HPI.   Cardiac studies: Echo (2/22): EF 65-70%, RV ok  Zio (1/22): Sinus rhythm - avg HR of 78 bpm, Attrial Flutter occurred (<1% burden), ranging from 113-208 bpm (avg of 148 bpm), the longest lasting 1 hour 16 mins with an avg rate of 139 bpm, rare PACs and PVCs.   [place] 2/21: RHC attempted but could not complete due to  Access challenges and inability to get into the PA. RA pressure was 3 and cardiac output was normal. There was evidence of multiple venous narrowings in the IVC.   Echo 12/20: EF 60-65%, mod increased LVH, Grade III DD, mod elevated PA pressure, RVSP 44.6 mmHg, moderate TR Echo 8/17: EF 60-65%, PA peak pressure 48 mmHg,  Past Medical History:  Diagnosis Date  . Bacteremia due to Gram-negative bacteria 05/23/2011  . Blind    right eye  . Blind right eye   . CHF (congestive heart failure) ([place])   . Chronic lower back pain   . Complication of anesthesia    "woke up during OR; I have an extremely high tolerance" (12/11/2011) 1 procedure was graft; the other procedures were procedures that are typically done with sedation.  [name]  DDD (degenerative disc disease), cervical   . Depression   . Dysrhythmia    "tachycardia" (12/11/2011) new onset afib 10/15/14 EKG  . E coli bacteremia 06/18/2011  . Elevated LDL cholesterol level 12/2018  . ESRD (end stage renal disease) ([place]) 06/12/2011  . Fibromyalgia   . Gastroparesis   . Gastroparesis   . GERD (gastroesophageal reflux disease)   . Glaucoma    right eye  . Gout   . H/O carpal tunnel syndrome   . Headache 10/2019  . Headache(784.0)    "not often anymore" (12/11/2011)  . Herpes genitalia 1994   . History of blood transfusion    "more than a few times" (12/11/2011)  . History of stomach ulcers   . Hypotension   . Iron deficiency anemia   . New onset a-fib ([place])    10/15/14 EKG  . Osteopenia   . Peripheral neuropathy 11/2018  . Pressure ulcer of sacral region, stage 1 07/2019  . Seizures ([place]) 1994   "post transplant; only have had that one" (12/11/2011)  . Spinal stenosis in cervical region   . Stroke [facility])     left basal ganglia lacunar infarct; Right frontal lobe lacunar infarct.  . Stroke [facility]) ~ 1999; 2001   "briefly lost my vision; lost my right eye" (12/11/2011)  . Vitamin D deficiency 12/2018    Current Outpatient Medications  Medication Sig Dispense Refill  . allopurinol (ZYLOPRIM) 100 MG tablet Take 1 tablet (100 mg total) by mouth daily. 90 tablet 3  . aspirin EC 81 MG tablet Take 81 mg by mouth daily.     . B Complex-C-Zn-Folic Acid (DIALYVITE 563-OVFI 15) 0.8 MG TABS Take 1 tablet by mouth daily.    . calcium acetate, Phos Binder, (PHOSLYRA) 667 MG/5ML SOLN Take 2,001 mg by mouth 3 (three) times daily with meals.    . Colchicine 0.6 MG CAPS Take 0.6 mg by mouth daily as needed (gout flare up).    . diclofenac Sodium (VOLTAREN) 1 % GEL Apply 2 g topically 4 (four) times daily. 150 g 5  . diphenhydrAMINE (BENADRYL) 25 mg capsule Take 1 capsule (25 mg total) by mouth every 8 (eight) hours as needed. (Patient taking differently: Take 25 mg by mouth every 8 (eight) hours as needed for itching.) 30 capsule 6  . famotidine (PEPCID) 20 MG tablet Take 1 tablet (20 mg total) by mouth 2 (two) times daily. 60 tablet 11  . gabapentin (NEURONTIN) 100 MG capsule Take 100 mg by mouth 2 (two) times daily.     [name] latanoprost (XALATAN) 0.005 % ophthalmic solution Place 1 drop into the left eye at bedtime.  3  . lidocaine (LIDODERM) 5 % Place 1 patch onto the skin daily as needed (for pain- remove old patch first).     . Methoxy PEG-Epoetin Beta (MIRCERA IJ) Mircera    .  metoCLOPramide (REGLAN) 5 MG tablet Take 5 mg by mouth 2 (two) times daily before a meal.    . midodrine (PROAMATINE) 10 MG tablet Take 1.5 tablets (15 mg total) by mouth 3 (three) times daily. 405 tablet 3  . ondansetron (ZOFRAN-ODT) 4 MG disintegrating tablet Take 1 tablet (4 mg total) by mouth every 8 (eight) hours as needed for nausea or vomiting. 20 tablet 11  . oxyCODONE-acetaminophen (PERCOCET) 10-325 MG tablet Take 1 tablet by mouth 4 (four) times daily as needed for pain.    . predniSONE (DELTASONE) 5 MG tablet Take 5 mg by mouth daily with breakfast.    .  tacrolimus (PROGRAF) 1 MG capsule Take 1 mg by mouth 2 (two) times daily.    . Vitamin D, Ergocalciferol, (DRISDOL) 1.25 MG (50000 UNIT) CAPS capsule TAKE 1 CAPSULE (50,000 UNITS TOTAL) BY MOUTH EVERY 7 (SEVEN) DAYS. 5 capsule 6  . zolpidem (AMBIEN) 10 MG tablet Take 1 tablet (10 mg total) by mouth at bedtime as needed for sleep. 10 tablet 0   No current facility-administered medications for this encounter.    Allergies  Allergen Reactions  . Levofloxacin Itching and Rash  . Tape Other (See Comments)    "Certain surgical tapes peel off my skin"     Social History   Socioeconomic History  . Marital status: Single    Spouse name: Not on file  . Number of children: 0  . Years of education: some college  . Highest education level: Some college, no degree  Occupational History  . Occupation: disabled    Fish farm manager: UNEMPLOYED  Tobacco Use  . Smoking status: Current Every Day Smoker    Packs/day: 0.33    Years: 35.00    Pack years: 11.55    Types: Cigarettes  . Smokeless tobacco: Never Used  . Tobacco comment: has patches but not currently using - plan to start using first of January 2022  Vaping Use  . Vaping Use: Never used  Substance and Sexual Activity  . Alcohol use: Yes    Comment: rare if out to dinner  . Drug use: Yes    Frequency: 1.0 times per week    Types: Marijuana    Comment: smoking once a day when in  severe pain  . Sexual activity: Yes    Partners: Male    Birth control/protection: None  Other Topics Concern  . Not on file  Social History Narrative   Right-handed.   Four cups caffeine per day.   Lives alone.   Social Determinants of Health   Financial Resource Strain: Low Risk   . Difficulty of Paying Living Expenses: Not hard at all  Food Insecurity: No Food Insecurity  . Worried About Charity fundraiser in the Last Year: Never true  . Ran Out of Food in the Last Year: Never true  Transportation Needs: No Transportation Needs  . Lack of Transportation (Medical): No  . Lack of Transportation (Non-Medical): No  Physical Activity: Insufficiently Active  . Days of Exercise per Week: 3 days  . Minutes of Exercise per Session: 10 min  Stress: Stress Concern Present  . Feeling of Stress : To some extent  Social Connections: Moderately Isolated  . Frequency of Communication with Friends and Family: More than three times a week  . Frequency of Social Gatherings with Friends and Family: More than three times a week  . Attends Religious Services: More than 4 times per year  . Active Member of Clubs or Organizations: No  . Attends Archivist Meetings: Never  . Marital Status: Never married  Intimate Partner Violence: Not At Risk  . Fear of Current or Ex-Partner: No  . Emotionally Abused: No  . Physically Abused: No  . Sexually Abused: No     Family History  Problem Relation Age of Onset  . Glaucoma Mother   . Pancreatic cancer Father   . Multiple sclerosis Brother   . Hypertension Maternal Grandmother   . Breast cancer Neg Hx     Vitals:   04/03/20 0910  BP: 105/66  Pulse: 73  SpO2: 98%  Weight: 48.9 kg (107 lb  12.8 oz)     Wt Readings from Last 3 Encounters:  04/03/20 48.9 kg (107 lb 12.8 oz)  03/28/20 47 kg (103 lb 9.9 oz)  03/06/20 49.4 kg (108 lb 12.8 oz)   PHYSICAL EXAM: General:  NAD. No resp difficulty HEENT: Normal, s/p right eye  enucleation. Neck: Supple. No JVD. Carotids 2+ bilat; no bruits. No lymphadenopathy or thryomegaly appreciated. Cor: PMI nondisplaced. Regular rate & rhythm. No rubs, gallops or murmurs. Lungs: Clear, diminished. Abdomen: Soft, nontender, nondistended. No hepatosplenomegaly. No bruits or masses. Good bowel sounds. Extremities: No cyanosis, clubbing, rash, edema, HD access R groin. Neuro: Alert & oriented x 3, cranial nerves grossly intact. Moves all 4 extremities w/o difficulty. Affect pleasant.  ASSESSMENT & PLAN:  1. Atrial fibrillation, paroxysmal - Zio patch (2/22) with <1% atrial flutter burden, some PVC/PACs, mostly NSR. - ChadsVasc Score 6. - Will refer to Afib clinic (? If antiarrhythmic therapy warranted with such low afib/flutter burden). - Will arrange Lexiscan to further quantify known non-obstructive CAD, that could be contributing to symptoms (personally discussed with Dr. Haroldine Laws). - Start Eliquis 2.5 mg bid (weight <60 kg & ESRD). - Stop ASA. - Will follow CBC.  2. Chronic diatolic heart failure with preserved ejection fraction ([place]) - Echo 12/20 EF 60% RV normal. Grade 3 DD - Echo (2/22): EF 65-70%, RV normal. - NYHA II. Volume good today, due for HD tomorrow.  3. Hypotension - Continue midodrine 15 mg tid. - Continue compression stockings as tolerated.  4. Tobacco use - Smokes 0.5 ppd. Encouraged her to quit. - Stop date 04/09/20. Plans to start patches.  5. ESRD - s/p previous failed renal transplant. - She is T/TH/Sat HD. - Has not started PD, says it is a last resort. - Renal transplant work up per [place] at [place] in process  [place] scheduled, refer to Afib clinic. Follow back with Dr. Haroldine Laws in 4-5 months.  Rafael Bihari, [place] 04/03/20 9:12 AM

## 2020-04-02 LAB — CULTURE, BLOOD (ROUTINE X 2)
Culture: NO GROWTH
Special Requests: ADEQUATE

## 2020-04-03 ENCOUNTER — Encounter (HOSPITAL_COMMUNITY): Payer: Self-pay

## 2020-04-03 ENCOUNTER — Other Ambulatory Visit: Payer: Self-pay

## 2020-04-03 ENCOUNTER — Ambulatory Visit (HOSPITAL_COMMUNITY)
Admission: RE | Admit: 2020-04-03 | Discharge: 2020-04-03 | Disposition: A | Discharge status: Home / Self Care | Payer: Medicare Other | Source: Ambulatory Visit | Attending: Family Medicine | Admitting: Family Medicine

## 2020-04-03 VITALS — BP 105/66 | HR 73 | Wt 107.8 lb

## 2020-04-03 DIAGNOSIS — Z888 Allergy status to other drugs, medicaments and biological substances status: Secondary | ICD-10-CM | POA: Diagnosis not present

## 2020-04-03 DIAGNOSIS — I480 Paroxysmal atrial fibrillation: Secondary | ICD-10-CM | POA: Insufficient documentation

## 2020-04-03 DIAGNOSIS — I120 Hypertensive chronic kidney disease with stage 5 chronic kidney disease or end stage renal disease: Secondary | ICD-10-CM | POA: Diagnosis not present

## 2020-04-03 DIAGNOSIS — F17210 Nicotine dependence, cigarettes, uncomplicated: Secondary | ICD-10-CM | POA: Diagnosis not present

## 2020-04-03 DIAGNOSIS — Z8673 Personal history of transient ischemic attack (TIA), and cerebral infarction without residual deficits: Secondary | ICD-10-CM | POA: Diagnosis not present

## 2020-04-03 DIAGNOSIS — I959 Hypotension, unspecified: Secondary | ICD-10-CM | POA: Insufficient documentation

## 2020-04-03 DIAGNOSIS — Z881 Allergy status to other antibiotic agents status: Secondary | ICD-10-CM | POA: Diagnosis not present

## 2020-04-03 DIAGNOSIS — I4892 Unspecified atrial flutter: Secondary | ICD-10-CM | POA: Diagnosis not present

## 2020-04-03 DIAGNOSIS — I5032 Chronic diastolic (congestive) heart failure: Secondary | ICD-10-CM

## 2020-04-03 DIAGNOSIS — Z79899 Other long term (current) drug therapy: Secondary | ICD-10-CM | POA: Insufficient documentation

## 2020-04-03 DIAGNOSIS — I132 Hypertensive heart and chronic kidney disease with heart failure and with stage 5 chronic kidney disease, or end stage renal disease: Secondary | ICD-10-CM | POA: Insufficient documentation

## 2020-04-03 DIAGNOSIS — Z992 Dependence on renal dialysis: Secondary | ICD-10-CM | POA: Diagnosis not present

## 2020-04-03 DIAGNOSIS — Z7952 Long term (current) use of systemic steroids: Secondary | ICD-10-CM | POA: Diagnosis not present

## 2020-04-03 DIAGNOSIS — Z940 Kidney transplant status: Secondary | ICD-10-CM | POA: Insufficient documentation

## 2020-04-03 DIAGNOSIS — Z560 Unemployment, unspecified: Secondary | ICD-10-CM | POA: Insufficient documentation

## 2020-04-03 DIAGNOSIS — I953 Hypotension of hemodialysis: Secondary | ICD-10-CM

## 2020-04-03 DIAGNOSIS — Z791 Long term (current) use of non-steroidal anti-inflammatories (NSAID): Secondary | ICD-10-CM | POA: Diagnosis not present

## 2020-04-03 DIAGNOSIS — Z720 Tobacco use: Secondary | ICD-10-CM

## 2020-04-03 DIAGNOSIS — I2510 Atherosclerotic heart disease of native coronary artery without angina pectoris: Secondary | ICD-10-CM | POA: Insufficient documentation

## 2020-04-03 DIAGNOSIS — Z7982 Long term (current) use of aspirin: Secondary | ICD-10-CM | POA: Insufficient documentation

## 2020-04-03 DIAGNOSIS — N186 End stage renal disease: Secondary | ICD-10-CM

## 2020-04-03 DIAGNOSIS — Z7901 Long term (current) use of anticoagulants: Secondary | ICD-10-CM | POA: Insufficient documentation

## 2020-04-03 MED ORDER — APIXABAN 2.5 MG PO TABS: 2.5000 mg | ORAL_TABLET | Freq: Two times a day (BID) | ORAL | 3 refills | Status: DC

## 2020-04-03 NOTE — Patient Instructions (Addendum)
START Eliquis 2.5mg , one tab twice a day STOP Aspirin   You have been referred to [facility] -they will be in contact with appt details  Your physician recommends that you schedule a follow-up appointment in: 5-6 months with Dr Haroldine Laws  Do the following things EVERYDAY: 1) Weigh yourself in the morning before breakfast. Write it down and keep it in a log. 2) Take your medicines as prescribed 3) Eat low salt foods--Limit salt (sodium) to 2000 mg per day.  4) Stay as active as you can everyday 5) Limit all fluids for the day to less than 2 liters At the [facility], you and your health needs are our priority. As part of our continuing mission to provide you with exceptional heart care, we have created designated Provider Care Teams. These Care Teams include your primary Cardiologist (physician) and Advanced Practice Providers (APPs- Physician Assistants and Nurse Practitioners) who all work together to provide you with the care you need, when you need it.   You may see any of the following providers on your designated Care Team at your next follow up: [name] Dr Glori Bickers . Dr Loralie Champagne . Dr Vickki Muff . Darrick Grinder, NP . Lyda Jester, [place] . Audry Riles, PharmD   Please be sure to bring in all your medications bottles to every appointment.   If you have any questions or concerns before your next appointment please send [name] a message through [place] or call our office at [phone].    TO LEAVE A MESSAGE FOR THE NURSE SELECT OPTION 2, PLEASE LEAVE A MESSAGE INCLUDING: . YOUR NAME . DATE OF BIRTH . CALL BACK NUMBER . REASON FOR CALL**this is important as we prioritize the call backs  YOU WILL RECEIVE A CALL BACK THE SAME DAY AS LONG AS YOU CALL BEFORE 4:00 PM  Please see our updated No Show and Same Day Appointment Cancellation Policy attached to your AVS.

## 2020-04-04 NOTE — Addendum Note (Signed)
Encounter addended by: Scarlette Calico, RN on: 04/04/2020 4:54 PM  Actions taken: Order list changed, Diagnosis association updated

## 2020-04-09 ENCOUNTER — Telehealth: Payer: Self-pay | Admitting: Family Medicine

## 2020-04-09 NOTE — Telephone Encounter (Signed)
Home Health Referral

## 2020-04-10 ENCOUNTER — Telehealth (HOSPITAL_COMMUNITY): Payer: Self-pay | Admitting: *Deleted

## 2020-04-10 NOTE — Telephone Encounter (Signed)
Close encounter 

## 2020-04-11 ENCOUNTER — Encounter: Payer: Self-pay | Admitting: Gastroenterology

## 2020-04-12 ENCOUNTER — Other Ambulatory Visit: Payer: Self-pay

## 2020-04-12 ENCOUNTER — Ambulatory Visit (HOSPITAL_COMMUNITY)
Admission: RE | Admit: 2020-04-12 | Discharge: 2020-04-12 | Disposition: A | Discharge status: Home / Self Care | Payer: Medicare Other | Source: Ambulatory Visit | Attending: Cardiology | Admitting: Cardiology

## 2020-04-12 DIAGNOSIS — I4892 Unspecified atrial flutter: Secondary | ICD-10-CM

## 2020-04-12 LAB — MYOCARDIAL PERFUSION IMAGING
LV dias vol: 55 mL (ref 46–106)
LV sys vol: 14 mL
SDS: 4
SRS: 2
SSS: 6
TID: 0.85

## 2020-04-12 MED ORDER — TECHNETIUM TC 99M TETROFOSMIN IV KIT
29.7000 | PACK | Freq: Once | INTRAVENOUS | Status: AC | PRN
  Administered 2020-04-12: 29.7 via INTRAVENOUS
  Filled 2020-04-12: qty 30

## 2020-04-12 MED ORDER — AMINOPHYLLINE 25 MG/ML IV SOLN
75.0000 mg | Freq: Once | INTRAVENOUS | Status: AC
  Administered 2020-04-12: 75 mg via INTRAVENOUS

## 2020-04-12 MED ORDER — REGADENOSON 0.4 MG/5ML IV SOLN
0.4000 mg | Freq: Once | INTRAVENOUS | Status: AC
  Administered 2020-04-12: 0.4 mg via INTRAVENOUS

## 2020-04-12 MED ORDER — TECHNETIUM TC 99M TETROFOSMIN IV KIT
9.8000 | PACK | Freq: Once | INTRAVENOUS | Status: AC | PRN
  Administered 2020-04-12: 9.8 via INTRAVENOUS
  Filled 2020-04-12: qty 10

## 2020-04-17 ENCOUNTER — Ambulatory Visit (HOSPITAL_COMMUNITY)
Admission: RE | Admit: 2020-04-17 | Discharge: 2020-04-17 | Disposition: A | Discharge status: Home / Self Care | Payer: Medicare Other | Source: Ambulatory Visit | Attending: Nurse Practitioner | Admitting: Nurse Practitioner

## 2020-04-17 ENCOUNTER — Other Ambulatory Visit: Payer: Self-pay

## 2020-04-17 ENCOUNTER — Encounter (HOSPITAL_COMMUNITY): Payer: Self-pay | Admitting: Nurse Practitioner

## 2020-04-17 ENCOUNTER — Other Ambulatory Visit: Payer: Self-pay | Admitting: Obstetrics and Gynecology

## 2020-04-17 VITALS — BP 90/58 | HR 82 | Ht 59.0 in | Wt 106.2 lb

## 2020-04-17 DIAGNOSIS — I4892 Unspecified atrial flutter: Secondary | ICD-10-CM | POA: Diagnosis present

## 2020-04-17 DIAGNOSIS — M109 Gout, unspecified: Secondary | ICD-10-CM | POA: Diagnosis not present

## 2020-04-17 DIAGNOSIS — I5032 Chronic diastolic (congestive) heart failure: Secondary | ICD-10-CM | POA: Diagnosis not present

## 2020-04-17 DIAGNOSIS — F17210 Nicotine dependence, cigarettes, uncomplicated: Secondary | ICD-10-CM | POA: Insufficient documentation

## 2020-04-17 DIAGNOSIS — Z560 Unemployment, unspecified: Secondary | ICD-10-CM | POA: Insufficient documentation

## 2020-04-17 DIAGNOSIS — I130 Hypertensive heart and chronic kidney disease with heart failure and stage 1 through stage 4 chronic kidney disease, or unspecified chronic kidney disease: Secondary | ICD-10-CM | POA: Diagnosis not present

## 2020-04-17 DIAGNOSIS — Z8616 Personal history of COVID-19: Secondary | ICD-10-CM | POA: Insufficient documentation

## 2020-04-17 DIAGNOSIS — Z940 Kidney transplant status: Secondary | ICD-10-CM | POA: Insufficient documentation

## 2020-04-17 DIAGNOSIS — Z7901 Long term (current) use of anticoagulants: Secondary | ICD-10-CM | POA: Diagnosis not present

## 2020-04-17 DIAGNOSIS — I272 Pulmonary hypertension, unspecified: Secondary | ICD-10-CM | POA: Diagnosis not present

## 2020-04-17 DIAGNOSIS — Z79899 Other long term (current) drug therapy: Secondary | ICD-10-CM | POA: Diagnosis not present

## 2020-04-17 DIAGNOSIS — D6869 Other thrombophilia: Secondary | ICD-10-CM

## 2020-04-17 DIAGNOSIS — Z992 Dependence on renal dialysis: Secondary | ICD-10-CM | POA: Diagnosis not present

## 2020-04-17 DIAGNOSIS — I951 Orthostatic hypotension: Secondary | ICD-10-CM | POA: Insufficient documentation

## 2020-04-17 DIAGNOSIS — Z7952 Long term (current) use of systemic steroids: Secondary | ICD-10-CM | POA: Insufficient documentation

## 2020-04-17 DIAGNOSIS — N186 End stage renal disease: Secondary | ICD-10-CM | POA: Insufficient documentation

## 2020-04-17 NOTE — Patient Instructions (Signed)
Hi Tina Watkins for speaking with me today.  Tina Watkins was given information about Medicaid Managed Care team care coordination services and verbally consented to engagement with the [facility] Managed Care team.   Patient verbalizes understanding of instructions provided today.   The Managed Medicaid care management team will reach out to the patient again over the next 30 days.  The patient has been provided with contact information for the Managed Medicaid care management team and has been advised to call with any health related questions or concerns.   Tina Raider RN, BSN [name]  [facility] - Managed Medicaid High Risk [phone]. Following is a copy of your plan of care:      Patient Care Plan: General Plan of Care (Adult)    Problem Identified: Health Promotion or Disease Self-Management (General Plan of Care)     Long-Range Goal: Self-Management Plan Developed   Start Date: 01/24/2020  Expected End Date: 06/17/2020  Recent Progress: Not on track  Priority: High  Note:   Current Barriers:  . Chronic Disease Management support and education needs.  Patient awaiting to hear from her PCP regarding approval of home health aide with [employer] [facility] [employer].  [name] Update 04/17/20:  Patient has been approved for 22 hours a month.  Will have help three days a week-[facility] [employer].  Nurse Case Manager Clinical Goal(s):  [name] Over the next 90 days, patient will attend all scheduled medical appointments: . Over the next 90 days, patient will work with CM team pharmacist to review medications.  Interventions:  . Inter-disciplinary care team collaboration (see longitudinal plan of care) . Advised patient to follow up with PCP regarding approval of home health aide with [employer].Completed. . Reviewed medications with patient. Tina Watkins with Pharmacy regarding medications. . Discussed plans with patient for ongoing care  management follow up and provided patient with direct contact information for care management team . Reviewed scheduled/upcoming provider appointments. . Care Guide referral for Pharmacist.  Patient Goals/Self-Care Activities Over the next 90 days, patient will:  -Self administers medications as prescribed Attends all scheduled provider appointments Calls pharmacy for medication refills Calls provider office for new concerns or questions  Follow Up Plan: The Managed Medicaid care management team will reach out to the patient again over the next 30 days.  The patient has been provided with contact information for the Managed Medicaid care management team and has been advised to call with any health related questions or concerns.   Evidence-based guidance:    Review biopsychosocial determinants of health screens.   Review need for preventive screening based on age, sex, family history and health history.   Determine level of modifiable health risk.   Discuss identified risks.   Identify areas where behavior change may lead to improved health.   Promote healthy lifestyle.   Evoke change talk using open-ended questions, pros and cons, as well as looking forward.   Identify and manage conditions or preconditions to reduce health risk.   Implement additional goals and interventions based on identified risk factors.

## 2020-04-17 NOTE — Progress Notes (Signed)
Primary Care Physician: [name], FNP Referring Physician: AHF AHF MD: Dr. Ciro Backer Derin Matthes is a 55 y.o. female with a h/o ESRD s/p failed renal transplant now on HD TTS, orthostatic hypotension, chronic diastolic CHF, moderate pHTN, HTN and gout brought to ED from outpatient HD with rapid A.flutter and fever.  Reportedly had fever with heart rates in 150s after 1 hour of HD session.  She had 20 mg IV Cardizem push.  RVR resolved but she became hypotensive.  In ED, febrile to 100.5.  No leukocytosis.  UA, CXR and CT abdomen and pelvis without significant finding.  She was hypotensive but blood pressure improved with 500 cc bolus.  She was in sinus rhythm.  Blood cultures obtained.  She was a started on broad-spectrum antibiotics for SIRS.  The next day, blood pressure improved.  Remained in sinus rhythm.  Blood cultures NGTD. TTE without significant finding.  Cleared by nephrology for discharge.  She felt well and asked to go home.  Nephrology to follow-up on further update of her blood culture and address accordingly.  She was seen in AHF clinc, 04/03/20. It was noted that pt's monitor showed less thatn 1% aflutter  burden and was referred here to see if antiarrythmic's were indicated. Ekg today shows NSR. Pt denies having any tachy arrhythmia's. She has not started eliquis as she was waiting  to have 5 teeth extractions end of April. She has a CHA2DS2VASc score of 6 with a previous stroke. She states that when she had Covid last June, that it caused her to lose her kidney transplant that she had for 16 years done in the [place] area. Hence she had to start back on dialysis.   Today, she denies symptoms of palpitations, chest pain, shortness of breath, orthopnea, PND, lower extremity edema, dizziness, presyncope, syncope, or neurologic sequela. The patient is tolerating medications without difficulties and is otherwise without complaint today.   Past Medical History:   Diagnosis Date  . Bacteremia due to Gram-negative bacteria 05/23/2011  . Blind    right eye  . Blind right eye   . CHF (congestive heart failure) ([place])   . Chronic lower back pain   . Complication of anesthesia    "woke up during OR; I have an extremely high tolerance" (12/11/2011) 1 procedure was graft; the other procedures were procedures that are typically done with sedation.  . DDD (degenerative disc disease), cervical   . Depression   . Dysrhythmia    "tachycardia" (12/11/2011) new onset afib 10/15/14 EKG  . E coli bacteremia 06/18/2011  . Elevated LDL cholesterol level 12/2018  . ESRD (end stage renal disease) ([place]) 06/12/2011  . Fibromyalgia   . Gastroparesis   . Gastroparesis   . GERD (gastroesophageal reflux disease)   . Glaucoma    right eye  . Gout   . H/O carpal tunnel syndrome   . Headache 10/2019  . Headache(784.0)    "not often anymore" (12/11/2011)  . Herpes genitalia 1994  . History of blood transfusion    "more than a few times" (12/11/2011)  . History of stomach ulcers   . Hypotension   . Iron deficiency anemia   . New onset a-fib ([place])    10/15/14 EKG  . Osteopenia   . Peripheral neuropathy 11/2018  . Pressure ulcer of sacral region, stage 1 07/2019  . Seizures ([place]) 1994   "post transplant; only have had that one" (12/11/2011)  . Spinal stenosis in  cervical region   . Stroke [facility])     left basal ganglia lacunar infarct; Right frontal lobe lacunar infarct.  . Stroke [facility]) ~ 1999; 2001   "briefly lost my vision; lost my right eye" (12/11/2011)  . Vitamin D deficiency 12/2018   Past Surgical History:  Procedure Laterality Date  . ANTERIOR CERVICAL DECOMP/DISCECTOMY FUSION N/A 01/08/2015   Procedure: Anterior Cervical Three-Four/Four-Five Decompression/Diskectomy/Fusion;  Surgeon: Leeroy Cha, MD;  Location: [place] NEURO ORS;  Service: Neurosurgery;  Laterality: N/A;  C3-4 C4-5 Anterior cervical decompression/diskectomy/fusion  . APPENDECTOMY  ~ 2004  .  CATARACT EXTRACTION     right eye  . COLONOSCOPY    . ENUCLEATION  2001   "right"  . ESOPHAGOGASTRODUODENOSCOPY (EGD) WITH PROPOFOL N/A 04/21/2012   Procedure: ESOPHAGOGASTRODUODENOSCOPY (EGD) WITH PROPOFOL;  Surgeon: Milus Banister, MD;  Location: [facility] ENDOSCOPY;  Service: Endoscopy;  Laterality: N/A;  . 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 IR FLUORO GUIDE CV LINE RIGHT  07/11/2019  . IR FLUORO GUIDE CV LINE RIGHT  10/20/2019  . IR RADIOLOGY PERIPHERAL GUIDED IV START  07/11/2019  . IR US GUIDE VASC ACCESS RIGHT  07/11/2019  . IR US GUIDE VASC ACCESS RIGHT  07/11/2019  . IR US GUIDE VASC ACCESS RIGHT  07/11/2019  . [place]; 1999; 2005   "right"  . MULTIPLE TOOTH EXTRACTIONS    . RIGHT HEART CATH N/A 03/23/2019   Procedure: RIGHT HEART CATH;  Surgeon: Jolaine Artist, MD;  Location: [place] CV LAB;  Service: Cardiovascular;  Laterality: N/A;  . TONSILLECTOMY    . TOTAL NEPHRECTOMY  1988?; 1994; 2005    Current Outpatient Medications  Medication Sig Dispense Refill  . allopurinol (ZYLOPRIM) 100 MG tablet Take 1 tablet (100 mg total) by mouth daily. 90 tablet 3  . apixaban (ELIQUIS) 2.5 MG TABS tablet Take 1 tablet (2.5 mg total) by mouth 2 (two) times daily. 60 tablet 3  . B Complex-C-Zn-Folic Acid (DIALYVITE 128-NOMV 15) 0.8 MG TABS Take 1 tablet by mouth daily.    . calcium acetate, Phos Binder, (PHOSLYRA) 667 MG/5ML SOLN Take 2,001 mg by mouth 3 (three) times daily with meals.    . Colchicine 0.6 MG CAPS Take 0.6 mg by mouth daily as needed (gout flare up).    . diclofenac Sodium (VOLTAREN) 1 % GEL Apply 2 g topically 4 (four) times daily. 150 g 5  . diphenhydrAMINE (BENADRYL) 25 mg capsule Take 1 capsule (25 mg total) by mouth every 8 (eight) hours as needed. (Patient taking differently: Take 25 mg by mouth every 8 (eight) hours as needed for itching.) 30 capsule 6  . famotidine (PEPCID) 20 MG tablet Take  1 tablet (20 mg total) by mouth 2 (two) times daily. 60 tablet 11  . gabapentin (NEURONTIN) 100 MG capsule Take 100 mg by mouth 2 (two) times daily.     [name] latanoprost (XALATAN) 0.005 % ophthalmic solution Place 1 drop into the left eye at bedtime.  3  . lidocaine (LIDODERM) 5 % Place 1 patch onto the skin daily as needed (for pain- remove old patch first).     . Methoxy PEG-Epoetin Beta (MIRCERA IJ) Mircera    . metoCLOPramide (REGLAN) 5 MG tablet Take 5 mg by mouth 2 (two) times daily before a meal.    . midodrine (PROAMATINE) 10 MG tablet Take 1.5 tablets (15 mg total) by  mouth 3 (three) times daily. 405 tablet 3  . ondansetron (ZOFRAN-ODT) 4 MG disintegrating tablet Take 1 tablet (4 mg total) by mouth every 8 (eight) hours as needed for nausea or vomiting. 20 tablet 11  . oxyCODONE-acetaminophen (PERCOCET) 10-325 MG tablet Take 1 tablet by mouth 4 (four) times daily as needed for pain.    . predniSONE (DELTASONE) 5 MG tablet Take 5 mg by mouth daily with breakfast.    . tacrolimus (PROGRAF) 1 MG capsule Take 1 mg by mouth 2 (two) times daily.    . Vitamin D, Ergocalciferol, (DRISDOL) 1.25 MG (50000 UNIT) CAPS capsule TAKE 1 CAPSULE (50,000 UNITS TOTAL) BY MOUTH EVERY 7 (SEVEN) DAYS. 5 capsule 6  . zolpidem (AMBIEN) 10 MG tablet Take 1 tablet (10 mg total) by mouth at bedtime as needed for sleep. 10 tablet 0   No current facility-administered medications for this encounter.    Allergies  Allergen Reactions  . Levofloxacin Itching and Rash  . Tape Other (See Comments)    "Certain surgical tapes peel off my skin"    Social History   Socioeconomic History  . Marital status: Single    Spouse name: Not on file  . Number of children: 0  . Years of education: some college  . Highest education level: Some college, no degree  Occupational History  . Occupation: disabled    Fish farm manager: UNEMPLOYED  Tobacco Use  . Smoking status: Current Every Day Smoker    Packs/day: 0.33    Years: 35.00     Pack years: 11.55    Types: Cigarettes  . Smokeless tobacco: Never Used  . Tobacco comment: has patches but not currently using - plan to start using first of January 2022  Vaping Use  . Vaping Use: Never used  Substance and Sexual Activity  . Alcohol use: Yes    Comment: rare if out to dinner  . Drug use: Yes    Frequency: 1.0 times per week    Types: Marijuana    Comment: smoking once a day when in severe pain  . Sexual activity: Yes    Partners: Male    Birth control/protection: None  Other Topics Concern  . Not on file  Social History Narrative   Right-handed.   Four cups caffeine per day.   Lives alone.   Social Determinants of Health   Financial Resource Strain: Low Risk   . Difficulty of Paying Living Expenses: Not hard at all  Food Insecurity: No Food Insecurity  . Worried About Charity fundraiser in the Last Year: Never true  . Ran Out of Food in the Last Year: Never true  Transportation Needs: No Transportation Needs  . Lack of Transportation (Medical): No  . Lack of Transportation (Non-Medical): No  Physical Activity: Insufficiently Active  . Days of Exercise per Week: 3 days  . Minutes of Exercise per Session: 10 min  Stress: Stress Concern Present  . Feeling of Stress : To some extent  Social Connections: Moderately Isolated  . Frequency of Communication with Friends and Family: More than three times a week  . Frequency of Social Gatherings with Friends and Family: More than three times a week  . Attends Religious Services: More than 4 times per year  . Active Member of Clubs or Organizations: No  . Attends Archivist Meetings: Never  . Marital Status: Never married  Intimate Partner Violence: Not At Risk  . Fear of Current or Ex-Partner: No  . Emotionally  Abused: No  . Physically Abused: No  . Sexually Abused: No    Family History  Problem Relation Age of Onset  . Glaucoma Mother   . Pancreatic cancer Father   . Multiple sclerosis  Brother   . Hypertension Maternal Grandmother   . Breast cancer Neg Hx     ROS- All systems are reviewed and negative except as per the HPI above  Physical Exam: There were no vitals filed for this visit. Wt Readings from Last 3 Encounters:  04/12/20 48.5 kg  04/03/20 48.9 kg  03/28/20 47 kg    Labs: Lab Results  Component Value Date   NA 137 03/29/2020   K 4.8 03/29/2020   CL 99 03/29/2020   CO2 21 (L) 03/29/2020   GLUCOSE 90 03/29/2020   BUN 41 (H) 03/29/2020   CREATININE 5.34 (H) 03/29/2020   CALCIUM 9.7 03/29/2020   PHOS 3.3 08/24/2019   MG 1.7 06/14/2011   Lab Results  Component Value Date   INR 1.1 03/28/2020   Lab Results  Component Value Date   CHOL 214 (H) 12/06/2018   HDL 89 12/06/2018   LDLCALC 105 (H) 12/06/2018   TRIG 118 12/06/2018     GEN- The patient is well appearing, alert and oriented x 3 today.   Head- normocephalic, atraumatic Eyes-  Sclera clear, conjunctiva pink Ears- hearing intact Oropharynx- clear Neck- supple, no JVP Lymph- no cervical lymphadenopathy Lungs- Clear to ausculation bilaterally, normal work of breathing Heart- Regular rate and rhythm, no murmurs, rubs or gallops, PMI not laterally displaced GI- soft, NT, ND, + BS Extremities- no clubbing, cyanosis, or edema MS- no significant deformity or atrophy Skin- no rash or lesion Psych- euthymic mood, full affect Neuro- strength and sensation are intact  EKG-NSR at 82 bpm, pr int 152 ms, qrs int 62 ms, qtc 472 ms   Echo-1. Left ventricular ejection fraction, by estimation, is 65 to 70%. The  left ventricle has normal function. The left ventricle has no regional  wall motion abnormalities. Left ventricular diastolic parameters were  normal.  2. Right ventricular systolic function is normal. The right ventricular  size is normal. There is normal pulmonary artery systolic pressure.  3. The mitral valve is normal in structure. No evidence of mitral valve  regurgitation.  No evidence of mitral stenosis.  4. The aortic valve is normal in structure. Aortic valve regurgitation is  not visualized. No aortic stenosis is present.  5. The inferior vena cava is normal in size with greater than 50%  respiratory variability, suggesting right atrial pressure of 3 mmHg.   Zio patch-Patient had a min HR of 52 bpm, max HR of 208 bpm, and avg HR of 78 bpm. Predominant underlying rhythm was Sinus Rhythm. Atrial Flutter occurred (<1% burden), ranging from 113-208 bpm (avg of 148 bpm), the longest lasting 1 hour 16 mins with an avg rate of 139 bpm. Atrial Flutter was detected within +/- 45 seconds of symptomatic patient event(s).  Stress test- 04/12/20-Study Highlights    The left ventricular ejection fraction is hyperdynamic (>65%).  Nuclear stress EF: 75%.  There was no ST segment deviation noted during stress.  Defect 1: There is a medium defect of mild severity present in the mid anterior location.  The study is normal.  This is a low risk study.  EKG from ER reviewed and showed atrial flutter at 2:1, probably typical  Assessment and Plan: 1. Atrial  flutter  In the setting of fever and post  dialysis Generation education re atrial arrhythmia's I do not feel that AAD's are indicated at this time for low a flutter burden Cannot use daily rate control for hypotension  Amiodarone would be her choice of drug 2/2 ESRD and HD  2. CHA2DS2VASc score of 6 She  has not started eliquis because of pending 5 teeth extractions end of April Pt encouraged to start tonight and can stop drug for 2 days prior to extractions Her risk of stroke was discussed in detail  She will stop ASA Bleeding precautions also discussed  Eliquis 2.5 mg bid for weight less than 60 KG and ESRD  F/u in afib clinic in 3 months  Butch Penny C. Grady Lucci, [facility] [address] [place], [place] [address] [phone]

## 2020-04-17 NOTE — Patient Outreach (Signed)
Medicaid Managed Care   Nurse Care Manager Note  04/17/2020 Name:  Tina Watkins MRN:  [id number] DOB:  11-07-1965  Tina Watkins is an 55 y.o. year old female who is a primary patient of [name], FNP.  The [facility] Managed Care Coordination team was consulted for assistance with:    chronic healthcare management needs.  Tina Watkins was given information about Medicaid Managed Care Coordination team services today. [name] agreed to services and verbal consent obtained.  Engaged with patient by telephone for follow up visit in response to provider referral for case management and/or care coordination services.   Assessments/Interventions:  Review of past medical history, allergies, medications, health status, including review of consultants reports, laboratory and other test data, was performed as part of comprehensive evaluation and provision of chronic care management services.  SDOH (Social Determinants of Health) assessments and interventions performed:   Care Plan  Allergies  Allergen Reactions  . Levofloxacin Itching and Rash  . Tape Other (See Comments)    "Certain surgical tapes peel off my skin"    Medications Reviewed Today    Reviewed by Gayla Medicus, RN (Registered Nurse) on 04/17/20 at 1058  Med List Status: <None>  Medication Order Taking? Sig Documenting Provider Last Dose Status Informant  allopurinol (ZYLOPRIM) 100 MG tablet [id number] Yes Take 1 tablet (100 mg total) by mouth daily. [name], FNP Taking Active Self  apixaban (ELIQUIS) 2.5 MG TABS tablet [id number] Yes Take 1 tablet (2.5 mg total) by mouth 2 (two) times daily. [place], Maricela Bo, FNP Taking Active   B Complex-C-Zn-Folic Acid (DIALYVITE 440-NUUV 15) 0.8 MG TABS [id number] Yes Take 1 tablet by mouth daily. [provider] Taking Active Self  busPIRone (BUSPAR) 10 MG tablet [id number] Yes TAKE 1/2 TO 1 TABLET BY MOUTH TWICE DAILY [provider] Taking Active   calcium acetate, Phos Binder, (PHOSLYRA) 667 MG/5ML SOLN [id number] Yes Take 2,001 mg by mouth 3 (three) times daily with meals. [provider] Taking Active Self  citalopram (CELEXA) 20 MG tablet [id number]  SMARTSIG:1 Tablet(s) By Mouth Every Evening [provider]  Active   Colchicine 0.6 MG CAPS [id number] Yes Take 0.6 mg by mouth daily as needed (gout flare up). [provider] Taking Active Self  diclofenac Sodium (VOLTAREN) 1 % GEL [id number] Yes Apply 2 g topically 4 (four) times daily. Leandrew Koyanagi, MD Taking Active Self           Med Note Dimitri Ped, AMANDA L   Wed Apr 03, 2020  9:08 AM)    diphenhydrAMINE (BENADRYL) 25 mg capsule [id number] Yes Take 1 capsule (25 mg total) by mouth every 8 (eight) hours as needed. [name], FNP Taking Active   famotidine (PEPCID) 20 MG tablet [id number] Yes Take 1 tablet (20 mg total) by mouth 2 (two) times daily. [name], FNP Taking Active Self  gabapentin (NEURONTIN) 100 MG capsule [id number] Yes Take 100 mg by mouth 2 (two) times daily.  [provider] Taking Active Self  latanoprost (XALATAN) 0.005 % ophthalmic solution [id number] Yes Place 1 drop into the left eye at bedtime. [provider] Taking Active Self           Med Note [name] Apr 17, 2019  6:42 PM)    lidocaine (LIDODERM) 5 % [id number] Yes Place 1 patch onto the skin daily as needed (for pain- remove old patch first).  [provider] Taking Active Self  Methoxy PEG-Epoetin Ernst Spell [facility] IJ) [id number] Yes Mircera [provider] Taking Active Self  metoCLOPramide (REGLAN) 5 MG tablet [id number] Yes Take 5 mg by mouth 2 (two) times daily before a meal. [provider] Taking Active Self  midodrine (PROAMATINE) 10 MG tablet [id number] Yes Take 1.5 tablets (15 mg total) by mouth 3 (three) times daily. Bensimhon, Shaune Pascal, MD Taking Active Self  ondansetron (ZOFRAN-ODT)  4 MG disintegrating tablet [id number] Yes Take 1 tablet (4 mg total) by mouth every 8 (eight) hours as needed for nausea or vomiting. [name], FNP Taking Active Self  oxyCODONE-acetaminophen (PERCOCET) 10-325 MG tablet [id number] Yes Take 1 tablet by mouth 4 (four) times daily as needed for pain. [provider] Taking Active Self           Med Note Dimitri Ped, AMANDA L   Wed Apr 03, 2020  9:10 AM)    predniSONE (DELTASONE) 5 MG tablet [id number] Yes Take 5 mg by mouth daily with breakfast. [provider] Taking Active Self  Vitamin D, Ergocalciferol, (DRISDOL) 1.25 MG (50000 UNIT) CAPS capsule [id number] Yes TAKE 1 CAPSULE (50,000 UNITS TOTAL) BY MOUTH EVERY 7 (SEVEN) DAYS. [name], FNP Taking Active Self  zolpidem (AMBIEN) 10 MG tablet [id number] Yes Take 1 tablet (10 mg total) by mouth at bedtime as needed for sleep. [name], DO Taking Active Self          Patient Active Problem List   Diagnosis Date Noted  . Atrial flutter ([place]) 03/29/2020  . Chronic pain of both knees 03/13/2020  . Anemia 09/02/2019  . Decubitus ulcer of sacral region, stage 1 08/18/2019  . HCAP (healthcare-associated pneumonia) 08/05/2019  . Pressure injury of skin 07/24/2019  . Chest congestion 07/24/2019  . Itching 07/24/2019  . Weakness 06/13/2019  . Pneumonia due to COVID-19 virus 05/09/2019  . Hypotension 05/09/2019  . Symptomatic anemia 05/09/2019  . CHF (congestive heart failure) ([place]) 05/09/2019  . Swollen abdomen 01/04/2019  . DDD (degenerative disc disease), cervical 12/06/2018  . Neuropathy 12/06/2018  . Paresthesia 10/11/2018  . Neck pain 10/11/2018  . Tobacco abuse 11/20/2017  . Right leg swelling 11/20/2017  . CKD (chronic kidney disease), stage III ([place]) 11/20/2017  . Right leg pain 11/20/2017  . Stroke (cerebrum) ([place]) 11/20/2017  . GERD (gastroesophageal reflux disease) 11/20/2017  . Acute venous embolism and thrombosis of deep vessels of proximal lower  extremity, right ([place]) 11/20/2017  . 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 Thrush, oral   . AKI (acute kidney injury) ([place])   . Multifocal pneumonia 08/09/2017  . Chest pain 09/29/2015  . Cervical stenosis of spinal canal 01/08/2015  . Urinary tract infectious disease   . Muscle spasms of neck 06/15/2014  . Bleeding hemorrhoid 06/15/2014  . Anemia in chronic kidney disease 06/15/2014  . Pyelonephritis, acute 06/13/2014  . Sepsis ([place]) 06/13/2014  . History of kidney transplant   . Chronic pain syndrome 04/09/2012  . Dehydration, mild 04/09/2012  . Gout attack 06/23/2011  . Herpes infection 06/23/2011  . Anxiety 06/23/2011  . E coli bacteremia 06/18/2011  . ESRD (end stage renal disease) ([place]) 06/12/2011  . UTI (lower urinary tract infection) 06/12/2011  . Bacteremia due to Gram-negative bacteria 05/23/2011  . History of renal transplantation 05/22/2011  . Septic shock(785.52) 05/22/2011  . Acute on chronic kidney failure ([place]) 05/22/2011  . Gastroparesis 04/24/2008  . WEIGHT LOSS  08/24/2007  . NAUSEA AND VOMITING 08/24/2007    Conditions to be addressed/monitored per PCP order:  chronic healthcare management, ESRD, HF, hypotension, GERD, anxiety.    Care Plan : General Plan of Care (Adult)  Updates made by Gayla Medicus, RN since 04/17/2020 12:00 AM    Problem: Health Promotion or Disease Self-Management (General Plan of Care)     Long-Range Goal: Self-Management Plan Developed   Start Date: 01/24/2020  Expected End Date: 06/17/2020  Recent Progress: Not on track  Priority: High  Note:   Current Barriers:  . Chronic Disease Management support and education needs.  Patient awaiting to hear from her PCP regarding approval of home health aide with [facility] [employer].  [name] Update 04/17/20:  Patient has been approved for 22 hours a month.  Will have help three days a week-[facility] [employer].  Nurse Case Manager Clinical Goal(s):   [name] Over the next 90 days, patient will attend all scheduled medical appointments: . Over the next 90 days, patient will work with CM team pharmacist to review medications.  Interventions:  . Inter-disciplinary care team collaboration (see longitudinal plan of care) . Advised patient to follow up with PCP regarding approval of home health aide with [employer] [facility] [employer].Completed. . Reviewed medications with patient. [name] with Pharmacy regarding medications. . Discussed plans with patient for ongoing care management follow up and provided patient with direct contact information for care management team . Reviewed scheduled/upcoming provider appointments. . Care Guide referral for Pharmacist.  Patient Goals/Self-Care Activities Over the next 90 days, patient will:  -Self administers medications as prescribed Attends all scheduled provider appointments Calls pharmacy for medication refills Calls provider office for new concerns or questions  Follow Up Plan: The Managed Medicaid care management team will reach out to the patient again over the next 30 days.  The patient has been provided with contact information for the Managed Medicaid care management team and has been advised to call with any health related questions or concerns.   Evidence-based guidance:    Review biopsychosocial determinants of health screens.   Review need for preventive screening based on age, sex, family history and health history.   Determine level of modifiable health risk.   Discuss identified risks.   Identify areas where behavior change may lead to improved health.   Promote healthy lifestyle.   Evoke change talk using open-ended questions, pros and cons, as well as looking forward.   Identify and manage conditions or preconditions to reduce health risk.   Implement additional goals and interventions based on identified risk factors.    Follow Up:  Patient agrees to Care Plan and  Follow-up.  Plan: The Managed Medicaid care management team will reach out to the patient again over the next 30 days. and The patient has been provided with contact information for the Managed Medicaid care management team and has been advised to call with any health related questions or concerns.  Date/time of next scheduled RN care management/care coordination outreach:  05/15/20 at 0900.

## 2020-04-23 ENCOUNTER — Other Ambulatory Visit: Payer: Self-pay

## 2020-04-23 ENCOUNTER — Ambulatory Visit (HOSPITAL_COMMUNITY)
Admission: RE | Admit: 2020-04-23 | Discharge: 2020-04-23 | Disposition: A | Discharge status: Home / Self Care | Payer: Medicare Other | Source: Ambulatory Visit | Attending: Physician Assistant | Admitting: Physician Assistant

## 2020-04-23 ENCOUNTER — Encounter (HOSPITAL_COMMUNITY): Payer: Self-pay | Admitting: Physician Assistant

## 2020-04-23 VITALS — BP 94/62 | HR 96 | Ht 59.0 in | Wt 103.0 lb

## 2020-04-23 DIAGNOSIS — Z8673 Personal history of transient ischemic attack (TIA), and cerebral infarction without residual deficits: Secondary | ICD-10-CM | POA: Insufficient documentation

## 2020-04-23 DIAGNOSIS — I4892 Unspecified atrial flutter: Secondary | ICD-10-CM | POA: Diagnosis not present

## 2020-04-23 DIAGNOSIS — T8612 Kidney transplant failure: Secondary | ICD-10-CM | POA: Insufficient documentation

## 2020-04-23 DIAGNOSIS — Z7952 Long term (current) use of systemic steroids: Secondary | ICD-10-CM | POA: Diagnosis not present

## 2020-04-23 DIAGNOSIS — F17210 Nicotine dependence, cigarettes, uncomplicated: Secondary | ICD-10-CM | POA: Diagnosis not present

## 2020-04-23 DIAGNOSIS — D6869 Other thrombophilia: Secondary | ICD-10-CM | POA: Diagnosis not present

## 2020-04-23 DIAGNOSIS — I951 Orthostatic hypotension: Secondary | ICD-10-CM | POA: Diagnosis not present

## 2020-04-23 DIAGNOSIS — I110 Hypertensive heart disease with heart failure: Secondary | ICD-10-CM | POA: Diagnosis not present

## 2020-04-23 DIAGNOSIS — Z79899 Other long term (current) drug therapy: Secondary | ICD-10-CM | POA: Insufficient documentation

## 2020-04-23 DIAGNOSIS — U099 Post covid-19 condition, unspecified: Secondary | ICD-10-CM | POA: Insufficient documentation

## 2020-04-23 DIAGNOSIS — Z992 Dependence on renal dialysis: Secondary | ICD-10-CM | POA: Diagnosis not present

## 2020-04-23 DIAGNOSIS — Z7901 Long term (current) use of anticoagulants: Secondary | ICD-10-CM | POA: Diagnosis not present

## 2020-04-23 DIAGNOSIS — M109 Gout, unspecified: Secondary | ICD-10-CM | POA: Insufficient documentation

## 2020-04-23 DIAGNOSIS — I5032 Chronic diastolic (congestive) heart failure: Secondary | ICD-10-CM | POA: Diagnosis not present

## 2020-04-23 DIAGNOSIS — Z888 Allergy status to other drugs, medicaments and biological substances status: Secondary | ICD-10-CM | POA: Diagnosis not present

## 2020-04-23 DIAGNOSIS — Z560 Unemployment, unspecified: Secondary | ICD-10-CM | POA: Diagnosis not present

## 2020-04-23 DIAGNOSIS — I483 Typical atrial flutter: Secondary | ICD-10-CM | POA: Diagnosis not present

## 2020-04-23 DIAGNOSIS — I272 Pulmonary hypertension, unspecified: Secondary | ICD-10-CM | POA: Diagnosis not present

## 2020-04-23 DIAGNOSIS — N186 End stage renal disease: Secondary | ICD-10-CM | POA: Insufficient documentation

## 2020-04-23 MED ORDER — AMIODARONE HCL 200 MG PO TABS
ORAL_TABLET | ORAL | 0 refills | Status: DC
Start: 2020-04-23 — End: 2020-05-10

## 2020-04-23 NOTE — Progress Notes (Signed)
Primary Care Physician: [name], FNP Referring Physician: AHF AHF MD: Dr. Ciro Backer Tina Watkins is a 55 y.o. female with a h/o ESRD s/p failed renal transplant now on HD TTS, orthostatic hypotension, chronic diastolic CHF, moderate pHTN, HTN and gout brought to ED from outpatient HD with rapid A.flutter and fever.  Reportedly had fever with heart rates in 150s after 1 hour of HD session.  She had 20 mg IV Cardizem push.  RVR resolved but she became hypotensive.  In ED, febrile to 100.5.  No leukocytosis.  UA, CXR and CT abdomen and pelvis without significant finding.  She was hypotensive but blood pressure improved with 500 cc bolus.  She was in sinus rhythm.  Blood cultures obtained.  She was a started on broad-spectrum antibiotics for SIRS.  The next day, blood pressure improved.  Remained in sinus rhythm.  Blood cultures NGTD. TTE without significant finding.  Cleared by nephrology for discharge.  She felt well and asked to go home.  Nephrology to follow-up on further update of her blood culture and address accordingly.  She was seen in AHF clinc, 04/03/20. It was noted that pt's monitor showed less thatn 1% aflutter  burden and was referred here to see if antiarrythmic's were indicated. Ekg today shows NSR. Pt denies having any tachy arrhythmia's. She has not started eliquis as she was waiting  to have 5 teeth extractions end of April. She has a CHA2DS2VASc score of 6 with a previous stroke. She states that when she had Covid last June, that it caused her to lose her kidney transplant that she had for 16 years done in the [place] area. Hence she had to start back on dialysis.  Follow up in the AF clinic 04/23/20. Patient had an episode of rapid atrial flutter today during HD. Heart rate was ~150 bpm. HD was stopped early and she was given fluids. Her heart rate dropped to 100 bpm. She is back in SR at the clinic today.   Today, she denies symptoms of chest pain,  shortness of breath, orthopnea, PND, lower extremity edema, dizziness, presyncope, syncope, or neurologic sequela. The patient is tolerating medications without difficulties and is otherwise without complaint today.   Past Medical History:  Diagnosis Date  . Bacteremia due to Gram-negative bacteria 05/23/2011  . Blind    right eye  . Blind right eye   . CHF (congestive heart failure) ([place])   . Chronic lower back pain   . Complication of anesthesia    "woke up during OR; I have an extremely high tolerance" (12/11/2011) 1 procedure was graft; the other procedures were procedures that are typically done with sedation.  . DDD (degenerative disc disease), cervical   . Depression   . Dysrhythmia    "tachycardia" (12/11/2011) new onset afib 10/15/14 EKG  . E coli bacteremia 06/18/2011  . Elevated LDL cholesterol level 12/2018  . ESRD (end stage renal disease) ([name]) 06/12/2011  . Fibromyalgia   . Gastroparesis   . Gastroparesis   . GERD (gastroesophageal reflux disease)   . Glaucoma    right eye  . Gout   . H/O carpal tunnel syndrome   . Headache 10/2019  . Headache(784.0)    "not often anymore" (12/11/2011)  . Herpes genitalia 1994  . History of blood transfusion    "more than a few times" (12/11/2011)  . History of stomach ulcers   . Hypotension   . Iron deficiency anemia   .  New onset a-fib ([place])    10/15/14 EKG  . Osteopenia   . Peripheral neuropathy 11/2018  . Pressure ulcer of sacral region, stage 1 07/2019  . Seizures ([place]) 1994   "post transplant; only have had that one" (12/11/2011)  . Spinal stenosis in cervical region   . Stroke [facility])     left basal ganglia lacunar infarct; Right frontal lobe lacunar infarct.  . Stroke [facility]) ~ 1999; 2001   "briefly lost my vision; lost my right eye" (12/11/2011)  . Vitamin D deficiency 12/2018   Past Surgical History:  Procedure Laterality Date  . ANTERIOR CERVICAL DECOMP/DISCECTOMY FUSION N/A 01/08/2015   Procedure: Anterior Cervical  Three-Four/Four-Five Decompression/Diskectomy/Fusion;  Surgeon: Leeroy Cha, MD;  Location: [facility];  Service: Neurosurgery;  Laterality: N/A;  C3-4 C4-5 Anterior cervical decompression/diskectomy/fusion  . APPENDECTOMY  ~ 2004  . CATARACT EXTRACTION     right eye  . COLONOSCOPY    . ENUCLEATION  2001   "right"  . ESOPHAGOGASTRODUODENOSCOPY (EGD) WITH PROPOFOL N/A 04/21/2012   Procedure: ESOPHAGOGASTRODUODENOSCOPY (EGD) WITH PROPOFOL;  Surgeon: Milus Banister, MD;  Location: [facility] END[facility];  Service: Endoscopy;  Laterality: N/A;  . 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 IR FLUORO GUIDE CV LINE RIGHT  07/11/2019  . IR FLUORO GUIDE CV LINE RIGHT  10/20/2019  . IR RADIOLOGY PERIPHERAL GUIDED IV START  07/11/2019  . IR US GUIDE VASC ACCESS RIGHT  07/11/2019  . IR US GUIDE VASC ACCESS RIGHT  07/11/2019  . IR US GUIDE VASC ACCESS RIGHT  07/11/2019  . [place]; 1999; 2005   "right"  . MULTIPLE TOOTH EXTRACTIONS    . RIGHT HEART CATH N/A 03/23/2019   Procedure: RIGHT HEART CATH;  Surgeon: Jolaine Artist, MD;  Location: [facility] CV LAB;  Service: Cardiovascular;  Laterality: N/A;  . TONSILLECTOMY    . TOTAL NEPHRECTOMY  1988?; 1994; 2005    Current Outpatient Medications  Medication Sig Dispense Refill  . allopurinol (ZYLOPRIM) 100 MG tablet Take 1 tablet (100 mg total) by mouth daily. 90 tablet 3  . apixaban (ELIQUIS) 2.5 MG TABS tablet Take 1 tablet (2.5 mg total) by mouth 2 (two) times daily. 60 tablet 3  . B Complex-C-Zn-Folic Acid (DIALYVITE 720-NOBS 15) 0.8 MG TABS Take 1 tablet by mouth daily.    . busPIRone (BUSPAR) 10 MG tablet TAKE 1/2 TO 1 TABLET BY MOUTH TWICE DAILY    . calcium acetate, Phos Binder, (PHOSLYRA) 667 MG/5ML SOLN Take 2,001 mg by mouth 3 (three) times daily with meals.    . citalopram (CELEXA) 20 MG tablet SMARTSIG:1 Tablet(s) By Mouth Every Evening    . Colchicine 0.6 MG CAPS Take 0.6  mg by mouth daily as needed (gout flare up).    . diclofenac Sodium (VOLTAREN) 1 % GEL Apply 2 g topically 4 (four) times daily. 150 g 5  . diphenhydrAMINE (BENADRYL) 25 mg capsule Take 1 capsule (25 mg total) by mouth every 8 (eight) hours as needed. 30 capsule 6  . famotidine (PEPCID) 20 MG tablet Take 1 tablet (20 mg total) by mouth 2 (two) times daily. 60 tablet 11  . gabapentin (NEURONTIN) 100 MG capsule Take 100 mg by mouth 2 (two) times daily.     [name] latanoprost (XALATAN) 0.005 % ophthalmic solution Place 1 drop into the left eye at bedtime.  3  . lidocaine (LIDODERM)  5 % Place 1 patch onto the skin daily as needed (for pain- remove old patch first).     . Methoxy PEG-Epoetin Beta (MIRCERA IJ) Mircera    . metoCLOPramide (REGLAN) 5 MG tablet Take 5 mg by mouth 2 (two) times daily before a meal.    . midodrine (PROAMATINE) 10 MG tablet Take 1.5 tablets (15 mg total) by mouth 3 (three) times daily. 405 tablet 3  . ondansetron (ZOFRAN-ODT) 4 MG disintegrating tablet Take 1 tablet (4 mg total) by mouth every 8 (eight) hours as needed for nausea or vomiting. 20 tablet 11  . oxyCODONE-acetaminophen (PERCOCET) 10-325 MG tablet Take 1 tablet by mouth 4 (four) times daily as needed for pain.    . predniSONE (DELTASONE) 5 MG tablet Take 5 mg by mouth daily with breakfast.    . Vitamin D, Ergocalciferol, (DRISDOL) 1.25 MG (50000 UNIT) CAPS capsule TAKE 1 CAPSULE (50,000 UNITS TOTAL) BY MOUTH EVERY 7 (SEVEN) DAYS. 5 capsule 6  . zolpidem (AMBIEN) 10 MG tablet Take 1 tablet (10 mg total) by mouth at bedtime as needed for sleep. 10 tablet 0   No current facility-administered medications for this visit.    Allergies  Allergen Reactions  . Levofloxacin Itching and Rash  . Tape Other (See Comments)    "Certain surgical tapes peel off my skin"    Social History   Socioeconomic History  . Marital status: Single    Spouse name: Not on file  . Number of children: 0  . Years of education: some  college  . Highest education level: Some college, no degree  Occupational History  . Occupation: disabled    Fish farm manager: UNEMPLOYED  Tobacco Use  . Smoking status: Current Every Day Smoker    Packs/day: 0.33    Years: 35.00    Pack years: 11.55    Types: Cigarettes  . Smokeless tobacco: Never Used  . Tobacco comment: 1 pack daily  Vaping Use  . Vaping Use: Never used  Substance and Sexual Activity  . Alcohol use: Yes    Alcohol/week: 1.0 standard drink    Types: 1 Standard drinks or equivalent per week    Comment: rare if out to dinner  . Drug use: Yes    Frequency: 1.0 times per week    Types: Marijuana    Comment: smoking once a day when in severe pain  . Sexual activity: Yes    Partners: Male    Birth control/protection: None  Other Topics Concern  . Not on file  Social History Narrative   Right-handed.   Four cups caffeine per day.   Lives alone.   Social Determinants of Health   Financial Resource Strain: Low Risk   . Difficulty of Paying Living Expenses: Not hard at all  Food Insecurity: No Food Insecurity  . Worried About Charity fundraiser in the Last Year: Never true  . Ran Out of Food in the Last Year: Never true  Transportation Needs: No Transportation Needs  . Lack of Transportation (Medical): No  . Lack of Transportation (Non-Medical): No  Physical Activity: Insufficiently Active  . Days of Exercise per Week: 3 days  . Minutes of Exercise per Session: 10 min  Stress: Stress Concern Present  . Feeling of Stress : To some extent  Social Connections: Moderately Isolated  . Frequency of Communication with Friends and Family: More than three times a week  . Frequency of Social Gatherings with Friends and Family: More than three times a  week  . Attends Religious Services: More than 4 times per year  . Active Member of Clubs or Organizations: No  . Attends Archivist Meetings: Never  . Marital Status: Never married  Intimate Partner Violence: Not  At Risk  . Fear of Current or Ex-Partner: No  . Emotionally Abused: No  . Physically Abused: No  . Sexually Abused: No    Family History  Problem Relation Age of Onset  . Glaucoma Mother   . Pancreatic cancer Father   . Multiple sclerosis Brother   . Hypertension Maternal Grandmother   . Breast cancer Neg Hx     ROS- All systems are reviewed and negative except as per the HPI above  Physical Exam: There were no vitals filed for this visit. Wt Readings from Last 3 Encounters:  04/17/20 48.2 kg  04/12/20 48.5 kg  04/03/20 48.9 kg    Labs: Lab Results  Component Value Date   NA 137 03/29/2020   K 4.8 03/29/2020   CL 99 03/29/2020   CO2 21 (L) 03/29/2020   GLUCOSE 90 03/29/2020   BUN 41 (H) 03/29/2020   CREATININE 5.34 (H) 03/29/2020   CALCIUM 9.7 03/29/2020   PHOS 3.3 08/24/2019   MG 1.7 06/14/2011   Lab Results  Component Value Date   INR 1.1 03/28/2020   Lab Results  Component Value Date   CHOL 214 (H) 12/06/2018   HDL 89 12/06/2018   LDLCALC 105 (H) 12/06/2018   TRIG 118 12/06/2018     GEN- The patient is a well appearing female, alert and oriented x 3 today.   HEENT-head normocephalic, atraumatic, sclera clear, conjunctiva pink, hearing intact, trachea midline. Lungs- Clear to ausculation bilaterally, normal work of breathing Heart- Regular rate and rhythm, no murmurs, rubs or gallops  GI- soft, NT, ND, + BS Extremities- no clubbing, cyanosis, or edema MS- no significant deformity or atrophy Skin- no rash or lesion Psych- euthymic mood, full affect Neuro- strength and sensation are intact   EKG- SR Vent. rate 96 BPM PR interval 144 ms QRS duration 60 ms QT/QTc 386/487 ms  Echo-1. Left ventricular ejection fraction, by estimation, is 65 to 70%. The  left ventricle has normal function. The left ventricle has no regional  wall motion abnormalities. Left ventricular diastolic parameters were  normal.  2. Right ventricular systolic function is  normal. The right ventricular  size is normal. There is normal pulmonary artery systolic pressure.  3. The mitral valve is normal in structure. No evidence of mitral valve  regurgitation. No evidence of mitral stenosis.  4. The aortic valve is normal in structure. Aortic valve regurgitation is  not visualized. No aortic stenosis is present.  5. The inferior vena cava is normal in size with greater than 50%  respiratory variability, suggesting right atrial pressure of 3 mmHg.   Zio patch-Patient had a min HR of 52 bpm, max HR of 208 bpm, and avg HR of 78 bpm. Predominant underlying rhythm was Sinus Rhythm. Atrial Flutter occurred (<1% burden), ranging from 113-208 bpm (avg of 148 bpm), the longest lasting 1 hour 16 mins with an avg rate of 139 bpm. Atrial Flutter was detected within +/- 45 seconds of symptomatic patient event(s).  Stress test- 04/12/20-Study Highlights    The left ventricular ejection fraction is hyperdynamic (>65%).  Nuclear stress EF: 75%.  There was no ST segment deviation noted during stress.  Defect 1: There is a medium defect of mild severity present in the mid  anterior location.  The study is normal.  This is a low risk study.   Assessment and Plan: 1. Atrial flutter  Patient's overall burden is low but she is very rapid and symptomatic when she does have an episode. AAD options are limited given ESRD. Will start amiodarone 200 mg BID Cannot use daily rate control for hypotension  Continue Eliquis 2.5 mg (weight, ESRD)  2. CHA2DS2VASc score of 6 Continue Eliquis as above.  3. Chronic diastolic CHF Followed in [facility].   Follow up in the AF clinic in 2 weeks.    [facility] [address] [place], [place] [address] [phone]

## 2020-04-23 NOTE — Patient Instructions (Signed)
Start Amiodarone 200mg  twice a day WITH FOOD for 14 days then reduce to 200mg  once a day

## 2020-04-24 ENCOUNTER — Ambulatory Visit (HOSPITAL_COMMUNITY): Payer: Medicare Other | Admitting: Physician Assistant

## 2020-05-02 NOTE — Progress Notes (Deleted)
     05/02/2020 Tina Watkins [id number] 21-Oct-1965   Chief Complaint:  History of Present Illness: Tina Watkins is a 55 year old female with a past medical history of CHF,  DVT, GERD  She is followed by Dr. Ardis Hughs. She was last seen in our office by Nicoletta Ba on 03/08/2019.   This was felt to be multifactorial with chronic narcotic use underlying diabetes mellitus and gastroparesis.  She has been on metoclopramide 5 mg twice daily which has been helpful    Current Medications, Allergies, Past Medical History, Past Surgical History, Family History and Social History were reviewed in [employer] record.   Review of Systems:   Constitutional: Negative for fever, sweats, chills or weight loss.  Respiratory: Negative for shortness of breath.   Cardiovascular: Negative for chest pain, palpitations and leg swelling.  Gastrointestinal: See HPI.  Musculoskeletal: Negative for back pain or muscle aches.  Neurological: Negative for dizziness, headaches or paresthesias.    Physical Exam: There were no vitals taken for this visit. General: Well developed, w   ***female in no acute distress. Head: Normocephalic and atraumatic. Eyes: No scleral icterus. Conjunctiva pink . Ears: Normal auditory acuity. Mouth: Dentition intact. No ulcers or lesions.  Lungs: Clear throughout to auscultation. Heart: Regular rate and rhythm, no murmur. Abdomen: Soft, nontender and nondistended. No masses or hepatomegaly. Normal bowel sounds x 4 quadrants.  Rectal: *** Musculoskeletal: Symmetrical with no gross deformities. Extremities: No edema. Neurological: Alert oriented x 4. No focal deficits.  Psychological: Alert and cooperative. Normal mood and affect  Assessment and Recommendations: ***

## 2020-05-03 ENCOUNTER — Ambulatory Visit: Payer: Medicare Other | Admitting: Nurse Practitioner

## 2020-05-09 ENCOUNTER — Other Ambulatory Visit: Payer: Self-pay

## 2020-05-09 MED ORDER — METOCLOPRAMIDE HCL 5 MG PO TABS: 5.0000 mg | ORAL_TABLET | Freq: Two times a day (BID) | ORAL | 3 refills | Status: DC

## 2020-05-10 ENCOUNTER — Ambulatory Visit (HOSPITAL_COMMUNITY)
Admission: RE | Admit: 2020-05-10 | Discharge: 2020-05-10 | Disposition: A | Discharge status: Home / Self Care | Payer: Medicare Other | Source: Ambulatory Visit | Attending: Nurse Practitioner | Admitting: Nurse Practitioner

## 2020-05-10 ENCOUNTER — Encounter (HOSPITAL_COMMUNITY): Payer: Self-pay | Admitting: Nurse Practitioner

## 2020-05-10 ENCOUNTER — Other Ambulatory Visit: Payer: Self-pay

## 2020-05-10 VITALS — BP 118/62 | HR 66 | Ht 59.0 in | Wt 108.0 lb

## 2020-05-10 DIAGNOSIS — Z79899 Other long term (current) drug therapy: Secondary | ICD-10-CM | POA: Insufficient documentation

## 2020-05-10 DIAGNOSIS — I4892 Unspecified atrial flutter: Secondary | ICD-10-CM | POA: Diagnosis not present

## 2020-05-10 DIAGNOSIS — I483 Typical atrial flutter: Secondary | ICD-10-CM | POA: Diagnosis not present

## 2020-05-10 DIAGNOSIS — F1290 Cannabis use, unspecified, uncomplicated: Secondary | ICD-10-CM | POA: Diagnosis not present

## 2020-05-10 DIAGNOSIS — F17210 Nicotine dependence, cigarettes, uncomplicated: Secondary | ICD-10-CM | POA: Diagnosis not present

## 2020-05-10 DIAGNOSIS — D6869 Other thrombophilia: Secondary | ICD-10-CM

## 2020-05-10 DIAGNOSIS — Z7901 Long term (current) use of anticoagulants: Secondary | ICD-10-CM | POA: Diagnosis not present

## 2020-05-10 MED ORDER — AMIODARONE HCL 200 MG PO TABS: 200.0000 mg | ORAL_TABLET | Freq: Every day | ORAL | 3 refills | Status: DC

## 2020-05-10 NOTE — Patient Instructions (Signed)
On Friday, April 22nd reduce amiodarone to 200mg  once a day

## 2020-05-10 NOTE — Progress Notes (Signed)
Primary Care Physician: [name], FNP Referring Physician: AHF AHF MD: Dr. Ciro Backer Tina Watkins is a 55 y.o. female with a h/o ESRD s/p failed renal transplant now on HD TTS, orthostatic hypotension, chronic diastolic CHF, moderate pHTN, HTN and gout brought to ED from outpatient HD with rapid A.flutter and fever.  Reportedly had fever with heart rates in 150s after 1 hour of HD session.  She had 20 mg IV Cardizem push.  RVR resolved but she became hypotensive.  In ED, febrile to 100.5.  No leukocytosis.  UA, CXR and CT abdomen and pelvis without significant finding.  She was hypotensive but blood pressure improved with 500 cc bolus.  She was in sinus rhythm.  Blood cultures obtained.  She was a started on broad-spectrum antibiotics for SIRS.  The next day, blood pressure improved.  Remained in sinus rhythm.  Blood cultures NGTD. TTE without significant finding.  Cleared by nephrology for discharge.  She felt well and asked to go home.  Nephrology to follow-up on further update of her blood culture and address accordingly.  She was seen in AHF clinc, 04/03/20. It was noted that pt's monitor showed less thatn 1% aflutter  burden and was referred here to see if antiarrythmic's were indicated. Ekg today shows NSR. Pt denies having any tachy arrhythmia's. She has not started eliquis as she was waiting  to have 5 teeth extractions end of April. She has a CHA2DS2VASc score of 6 with a previous stroke. She states that when she had Covid last June, that it caused her to lose her kidney transplant that she had for 16 years done in the [place] area. Hence she had to start back on dialysis.   F/u in afib clinic, 05/10/20. She was started on amiodarone on last visit for symptomatic afib with RVR at dialysis. She reports no sustained heart racing. She feels well today. . EKG shows  NSR at 66 bpm.   Today, she denies symptoms of palpitations, chest pain, shortness of breath, orthopnea,  PND, lower extremity edema, dizziness, presyncope, syncope, or neurologic sequela. The patient is tolerating medications without difficulties and is otherwise without complaint today.   Past Medical History:  Diagnosis Date  . Bacteremia due to Gram-negative bacteria 05/23/2011  . Blind    right eye  . Blind right eye   . CHF (congestive heart failure) ([place])   . Chronic lower back pain   . Complication of anesthesia    "woke up during OR; I have an extremely high tolerance" (12/11/2011) 1 procedure was graft; the other procedures were procedures that are typically done with sedation.  . DDD (degenerative disc disease), cervical   . Depression   . Dysrhythmia    "tachycardia" (12/11/2011) new onset afib 10/15/14 EKG  . E coli bacteremia 06/18/2011  . Elevated LDL cholesterol level 12/2018  . ESRD (end stage renal disease) ([place]) 06/12/2011  . Fibromyalgia   . Gastroparesis   . Gastroparesis   . GERD (gastroesophageal reflux disease)   . Glaucoma    right eye  . Gout   . H/O carpal tunnel syndrome   . Headache 10/2019  . Headache(784.0)    "not often anymore" (12/11/2011)  . Herpes genitalia 1994  . History of blood transfusion    "more than a few times" (12/11/2011)  . History of stomach ulcers   . Hypotension   . Iron deficiency anemia   . New onset a-fib ([place])    10/15/14  EKG  . Osteopenia   . Peripheral neuropathy 11/2018  . Pressure ulcer of sacral region, stage 1 07/2019  . Seizures ([place]) 1994   "post transplant; only have had that one" (12/11/2011)  . Spinal stenosis in cervical region   . Stroke [facility])     left basal ganglia lacunar infarct; Right frontal lobe lacunar infarct.  . Stroke [facility]) ~ 1999; 2001   "briefly lost my vision; lost my right eye" (12/11/2011)  . Vitamin D deficiency 12/2018   Past Surgical History:  Procedure Laterality Date  . ANTERIOR CERVICAL DECOMP/DISCECTOMY FUSION N/A 01/08/2015   Procedure: Anterior Cervical Three-Four/Four-Five  Decompression/Diskectomy/Fusion;  Surgeon: Leeroy Cha, MD;  Location: [facility];  Service: Neurosurgery;  Laterality: N/A;  C3-4 C4-5 Anterior cervical decompression/diskectomy/fusion  . APPENDECTOMY  ~ 2004  . CATARACT EXTRACTION     right eye  . COLONOSCOPY    . ENUCLEATION  2001   "right"  . ESOPHAGOGASTRODUODENOSCOPY (EGD) WITH PROPOFOL N/A 04/21/2012   Procedure: ESOPHAGOGASTRODUODENOSCOPY (EGD) WITH PROPOFOL;  Surgeon: Milus Banister, MD;  Location: [facility];  Service: Endoscopy;  Laterality: N/A;  . 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 IR FLUORO GUIDE CV LINE RIGHT  07/11/2019  . IR FLUORO GUIDE CV LINE RIGHT  10/20/2019  . IR RADIOLOGY PERIPHERAL GUIDED IV START  07/11/2019  . IR US GUIDE VASC ACCESS RIGHT  07/11/2019  . IR US GUIDE VASC ACCESS RIGHT  07/11/2019  . IR US GUIDE VASC ACCESS RIGHT  07/11/2019  . [place]; 1999; 2005   "right"  . MULTIPLE TOOTH EXTRACTIONS    . RIGHT HEART CATH N/A 03/23/2019   Procedure: RIGHT HEART CATH;  Surgeon: Jolaine Artist, MD;  Location: [facility] CV LAB;  Service: Cardiovascular;  Laterality: N/A;  . TONSILLECTOMY    . TOTAL NEPHRECTOMY  1988?; 1994; 2005    Current Outpatient Medications  Medication Sig Dispense Refill  . allopurinol (ZYLOPRIM) 100 MG tablet Take 1 tablet (100 mg total) by mouth daily. 90 tablet 3  . amiodarone (PACERONE) 200 MG tablet Take 1 tablet by mouth twice a day for 14 days then reduce to 1 tablet daily 60 tablet 0  . apixaban (ELIQUIS) 2.5 MG TABS tablet Take 1 tablet (2.5 mg total) by mouth 2 (two) times daily. 60 tablet 3  . B Complex-C-Zn-Folic Acid (DIALYVITE 245-YKDX 15) 0.8 MG TABS Take 1 tablet by mouth daily.    . busPIRone (BUSPAR) 10 MG tablet TAKE 1/2 TO 1 TABLET BY MOUTH TWICE DAILY    . calcium acetate, Phos Binder, (PHOSLYRA) 667 MG/5ML SOLN Take 2,001 mg by mouth 3 (three) times daily with meals.    .  citalopram (CELEXA) 20 MG tablet SMARTSIG:1 Tablet(s) By Mouth Every Evening    . Colchicine 0.6 MG CAPS Take 0.6 mg by mouth daily as needed (gout flare up).    . diclofenac Sodium (VOLTAREN) 1 % GEL Apply 2 g topically 4 (four) times daily. 150 g 5  . diphenhydrAMINE (BENADRYL) 25 mg capsule Take 1 capsule (25 mg total) by mouth every 8 (eight) hours as needed. 30 capsule 6  . famotidine (PEPCID) 20 MG tablet Take 1 tablet (20 mg total) by mouth 2 (two) times daily. 60 tablet 11  . gabapentin (NEURONTIN) 100 MG capsule Take 100 mg by mouth 2 (two) times daily.     [name] latanoprost (XALATAN)  0.005 % ophthalmic solution Place 1 drop into the left eye at bedtime.  3  . lidocaine (LIDODERM) 5 % Place 1 patch onto the skin daily as needed (for pain- remove old patch first).     . Methoxy PEG-Epoetin Beta (MIRCERA IJ) Mircera    . metoCLOPramide (REGLAN) 5 MG tablet Take 1 tablet (5 mg total) by mouth 2 (two) times daily before a meal. 180 tablet 3  . midodrine (PROAMATINE) 10 MG tablet Take 1.5 tablets (15 mg total) by mouth 3 (three) times daily. 405 tablet 3  . ondansetron (ZOFRAN-ODT) 4 MG disintegrating tablet Take 1 tablet (4 mg total) by mouth every 8 (eight) hours as needed for nausea or vomiting. 20 tablet 11  . oxyCODONE-acetaminophen (PERCOCET) 10-325 MG tablet Take 1 tablet by mouth 4 (four) times daily as needed for pain.    . predniSONE (DELTASONE) 5 MG tablet Take 5 mg by mouth daily with breakfast.    . Vitamin D, Ergocalciferol, (DRISDOL) 1.25 MG (50000 UNIT) CAPS capsule TAKE 1 CAPSULE (50,000 UNITS TOTAL) BY MOUTH EVERY 7 (SEVEN) DAYS. 5 capsule 6  . zolpidem (AMBIEN) 10 MG tablet Take 1 tablet (10 mg total) by mouth at bedtime as needed for sleep. 10 tablet 0   No current facility-administered medications for this encounter.    Allergies  Allergen Reactions  . Levofloxacin Itching and Rash  . Tape Other (See Comments)    "Certain surgical tapes peel off my skin"    Social  History   Socioeconomic History  . Marital status: Single    Spouse name: Not on file  . Number of children: 0  . Years of education: some college  . Highest education level: Some college, no degree  Occupational History  . Occupation: disabled    Fish farm manager: UNEMPLOYED  Tobacco Use  . Smoking status: Current Every Day Smoker    Packs/day: 0.33    Years: 35.00    Pack years: 11.55    Types: Cigarettes  . Smokeless tobacco: Never Used  . Tobacco comment: 1 pack daily  Vaping Use  . Vaping Use: Never used  Substance and Sexual Activity  . Alcohol use: Yes    Alcohol/week: 1.0 standard drink    Types: 1 Standard drinks or equivalent per week    Comment: rare if out to dinner  . Drug use: Yes    Frequency: 1.0 times per week    Types: Marijuana    Comment: smoking once a day when in severe pain  . Sexual activity: Yes    Partners: Male    Birth control/protection: None  Other Topics Concern  . Not on file  Social History Narrative   Right-handed.   Four cups caffeine per day.   Lives alone.   Social Determinants of Health   Financial Resource Strain: Low Risk   . Difficulty of Paying Living Expenses: Not hard at all  Food Insecurity: No Food Insecurity  . Worried About Charity fundraiser in the Last Year: Never true  . Ran Out of Food in the Last Year: Never true  Transportation Needs: No Transportation Needs  . Lack of Transportation (Medical): No  . Lack of Transportation (Non-Medical): No  Physical Activity: Insufficiently Active  . Days of Exercise per Week: 3 days  . Minutes of Exercise per Session: 10 min  Stress: Stress Concern Present  . Feeling of Stress : To some extent  Social Connections: Moderately Isolated  . Frequency of Communication with Friends  and Family: More than three times a week  . Frequency of Social Gatherings with Friends and Family: More than three times a week  . Attends Religious Services: More than 4 times per year  . Active Member  of Clubs or Organizations: No  . Attends Archivist Meetings: Never  . Marital Status: Never married  Intimate Partner Violence: Not At Risk  . Fear of Current or Ex-Partner: No  . Emotionally Abused: No  . Physically Abused: No  . Sexually Abused: No    Family History  Problem Relation Age of Onset  . Glaucoma Mother   . Pancreatic cancer Father   . Multiple sclerosis Brother   . Hypertension Maternal Grandmother   . Breast cancer Neg Hx     ROS- All systems are reviewed and negative except as per the HPI above  Physical Exam: There were no vitals filed for this visit. Wt Readings from Last 3 Encounters:  04/23/20 46.7 kg  04/17/20 48.2 kg  04/12/20 48.5 kg    Labs: Lab Results  Component Value Date   NA 137 03/29/2020   K 4.8 03/29/2020   CL 99 03/29/2020   CO2 21 (L) 03/29/2020   GLUCOSE 90 03/29/2020   BUN 41 (H) 03/29/2020   CREATININE 5.34 (H) 03/29/2020   CALCIUM 9.7 03/29/2020   PHOS 3.3 08/24/2019   MG 1.7 06/14/2011   Lab Results  Component Value Date   INR 1.1 03/28/2020   Lab Results  Component Value Date   CHOL 214 (H) 12/06/2018   HDL 89 12/06/2018   LDLCALC 105 (H) 12/06/2018   TRIG 118 12/06/2018     GEN- The patient is well appearing, alert and oriented x 3 today.   Head- normocephalic, atraumatic Eyes-  Sclera clear, conjunctiva pink Ears- hearing intact Oropharynx- clear Neck- supple, no JVP Lymph- no cervical lymphadenopathy Lungs- Clear to ausculation bilaterally, normal work of breathing Heart- Regular rate and rhythm, no murmurs, rubs or gallops, PMI not laterally displaced GI- soft, NT, ND, + BS Extremities- no clubbing, cyanosis, or edema MS- no significant deformity or atrophy Skin- no rash or lesion Psych- euthymic mood, full affect Neuro- strength and sensation are intact  EKG-NSR at 66 bpm, pr int 174 ms, qrs int 70 ms, qtc 494 ms   Echo-1. Left ventricular ejection fraction, by estimation, is 65 to 70%.  The  left ventricle has normal function. The left ventricle has no regional  wall motion abnormalities. Left ventricular diastolic parameters were  normal.  2. Right ventricular systolic function is normal. The right ventricular  size is normal. There is normal pulmonary artery systolic pressure.  3. The mitral valve is normal in structure. No evidence of mitral valve  regurgitation. No evidence of mitral stenosis.  4. The aortic valve is normal in structure. Aortic valve regurgitation is  not visualized. No aortic stenosis is present.  5. The inferior vena cava is normal in size with greater than 50%  respiratory variability, suggesting right atrial pressure of 3 mmHg.   Zio patch-Patient had a min HR of 52 bpm, max HR of 208 bpm, and avg HR of 78 bpm. Predominant underlying rhythm was Sinus Rhythm. Atrial Flutter occurred (<1% burden), ranging from 113-208 bpm (avg of 148 bpm), the longest lasting 1 hour 16 mins with an avg rate of 139 bpm. Atrial Flutter was detected within +/- 45 seconds of symptomatic patient event(s).  Stress test- 04/12/20-Study Highlights    The left ventricular ejection fraction  is hyperdynamic (>65%).  Nuclear stress EF: 75%.  There was no ST segment deviation noted during stress.  Defect 1: There is a medium defect of mild severity present in the mid anterior location.  The study is normal.  This is a low risk study.  EKG from ER reviewed and showed atrial flutter at 2:1, probably typical  Assessment and Plan: 1. Atrial  flutter  In SR Continue amiodarone at 200 mg bid for 2 more weeks then decrease to one tab a day  2. CHA2DS2VASc score of 6 Continue eliquis 2.5 mg bid  Off ASA Eliquis 2.5 mg bid for weight less than 60 KG and ESRD  F/u in afib clinic in I month Will obtain tsh/cmet at that visit   Butch Penny C. Alieah Brinton, [facility] [address] [place], [place] [address] [phone]

## 2020-05-15 ENCOUNTER — Other Ambulatory Visit: Payer: Self-pay

## 2020-05-15 ENCOUNTER — Other Ambulatory Visit: Payer: Self-pay | Admitting: Obstetrics and Gynecology

## 2020-05-15 NOTE — Patient Outreach (Signed)
Medicaid Managed Care   Nurse Care Manager Note  05/15/2020 Name:  Tina Watkins MRN:  [id number] DOB:  [birth year]  Tina Watkins is an 55 y.o. year old female who is a primary patient of Kathe Becton, [place].The [facility] Managed Care Coordination team was consulted for assistance with:    chronic healthcare management needs.  Ms. Galindez was given information about Medicaid Managed Care Coordination team services today. [name] agreed to services and verbal consent obtained.  Engaged with patient by telephone for follow up visit in response to provider referral for case management and/or care coordination services.   Assessments/Interventions:  Review of past medical history, allergies, medications, health status, including review of consultants reports, laboratory and other test data, was performed as part of comprehensive evaluation and provision of chronic care management services.  SDOH (Social Determinants of Health) assessments and interventions performed:   Care Plan  Allergies  Allergen Reactions  . Levofloxacin Itching and Rash  . Tape Other (See Comments)    "Certain surgical tapes peel off my skin"    Medications Reviewed Today    Reviewed by Gayla Medicus, RN (Registered Nurse) on 05/15/20 at [place] List Status: <None>  Medication Order Taking? Sig Documenting Provider Last Dose Status Informant  allopurinol (ZYLOPRIM) 100 MG tablet [id number] No Take 1 tablet (100 mg total) by mouth daily. [name], FNP Taking Active Self  amiodarone (PACERONE) 200 MG tablet [id number]  Take 1 tablet (200 mg total) by mouth daily. Sherran Needs, NP  Active   apixaban (ELIQUIS) 2.5 MG TABS tablet [id number] No Take 1 tablet (2.5 mg total) by mouth 2 (two) times daily. [place], Maricela Bo, FNP Taking Active   B Complex-C-Zn-Folic Acid (DIALYVITE 675-FFMB 15) 0.8 MG TABS [id number] No Take 1 tablet by mouth daily. [provider]  Taking Active Self  busPIRone (BUSPAR) 10 MG tablet [id number] No TAKE 1/2 TO 1 TABLET BY MOUTH TWICE DAILY [provider] Taking Active   calcium acetate, Phos Binder, (PHOSLYRA) 667 MG/5ML SOLN [id number] No Take 2,001 mg by mouth 3 (three) times daily with meals. [provider] Taking Active Self  citalopram (CELEXA) 20 MG tablet [id number] No SMARTSIG:1 Tablet(s) By Mouth Every Evening [provider] Taking Active   Colchicine 0.6 MG CAPS [id number] No Take 0.6 mg by mouth daily as needed (gout flare up). [provider] Taking Active Self  diclofenac Sodium (VOLTAREN) 1 % GEL [id number] No Apply 2 g topically 4 (four) times daily. Leandrew Koyanagi, MD Taking Active Self           Med Note Dimitri Ped, AMANDA L   Wed Apr 03, 2020  9:08 AM)    diphenhydrAMINE (BENADRYL) 25 mg capsule [id number] No Take 1 capsule (25 mg total) by mouth every 8 (eight) hours as needed. [name], FNP Taking Active   famotidine (PEPCID) 20 MG tablet [id number] No Take 1 tablet (20 mg total) by mouth 2 (two) times daily. [name], FNP Taking Active Self  gabapentin (NEURONTIN) 100 MG capsule [id number] No Take 100 mg by mouth 2 (two) times daily.  [provider] Taking Active Self  latanoprost (XALATAN) 0.005 % ophthalmic solution [id number] No Place 1 drop into the left eye at bedtime. [provider] Taking Active Self           Med Note [name] Apr 17, 2019  6:42 PM)    lidocaine (LIDODERM) 5 %  [id number] No Place 1 patch onto the skin daily as needed (for pain- remove old patch first).  [provider] Taking Active Self  Methoxy PEG-Epoetin Ernst Spell (MIRCERA IJ) [id number] No Mircera [provider] Taking Active Self  metoCLOPramide (REGLAN) 5 MG tablet [id number] No Take 1 tablet (5 mg total) by mouth 2 (two) times daily before a meal. Milus Banister, MD Taking Active   midodrine (PROAMATINE) 10 MG tablet [id number] No Take 1.5  tablets (15 mg total) by mouth 3 (three) times daily. Bensimhon, Shaune Pascal, MD Taking Active Self  ondansetron (ZOFRAN-ODT) 4 MG disintegrating tablet [id number] No Take 1 tablet (4 mg total) by mouth every 8 (eight) hours as needed for nausea or vomiting. [name], FNP Taking Active Self  oxyCODONE-acetaminophen (PERCOCET) 10-325 MG tablet [id number] No Take 1 tablet by mouth 4 (four) times daily as needed for pain. [provider] Taking Active Self           Med Note Dimitri Ped, AMANDA L   Wed Apr 03, 2020  9:10 AM)    predniSONE (DELTASONE) 5 MG tablet [id number] No Take 5 mg by mouth daily with breakfast. [provider] Taking Active Self  Vitamin D, Ergocalciferol, (DRISDOL) 1.25 MG (50000 UNIT) CAPS capsule [id number] No TAKE 1 CAPSULE (50,000 UNITS TOTAL) BY MOUTH EVERY 7 (SEVEN) DAYS. [name], FNP Taking Active Self  zolpidem (AMBIEN) 10 MG tablet [id number] No Take 1 tablet (10 mg total) by mouth at bedtime as needed for sleep. [name], DO Taking Active Self          Patient Active Problem List   Diagnosis Date Noted  . Secondary hypercoagulable state ([place]) 04/23/2020  . Atrial flutter ([place]) 03/29/2020  . Chronic pain of both knees 03/13/2020  . Anemia 09/02/2019  . Decubitus ulcer of sacral region, stage 1 08/18/2019  . HCAP (healthcare-associated pneumonia) 08/05/2019  . Pressure injury of skin 07/24/2019  . Chest congestion 07/24/2019  . Itching 07/24/2019  . Weakness 06/13/2019  . Pneumonia due to COVID-19 virus 05/09/2019  . Hypotension 05/09/2019  . Symptomatic anemia 05/09/2019  . CHF (congestive heart failure) ([place]) 05/09/2019  . Swollen abdomen 01/04/2019  . DDD (degenerative disc disease), cervical 12/06/2018  . Neuropathy 12/06/2018  . Paresthesia 10/11/2018  . Neck pain 10/11/2018  . Tobacco abuse 11/20/2017  . Right leg swelling 11/20/2017  . CKD (chronic kidney disease), stage III ([place]) 11/20/2017  . Right leg pain  11/20/2017  . Stroke (cerebrum) ([place]) 11/20/2017  . GERD (gastroesophageal reflux disease) 11/20/2017  . Acute venous embolism and thrombosis of deep vessels of proximal lower extremity, right ([place]) 11/20/2017  . 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 Thrush, oral   . AKI (acute kidney injury) ([place])   . Multifocal pneumonia 08/09/2017  . Chest pain 09/29/2015  . Cervical stenosis of spinal canal 01/08/2015  . Urinary tract infectious disease   . Muscle spasms of neck 06/15/2014  . Bleeding hemorrhoid 06/15/2014  . Anemia in chronic kidney disease 06/15/2014  . Pyelonephritis, acute 06/13/2014  . Sepsis ([place]) 06/13/2014  . History of kidney transplant   . Chronic pain syndrome 04/09/2012  . Dehydration, mild 04/09/2012  . Gout attack 06/23/2011  . Herpes infection 06/23/2011  . Anxiety 06/23/2011  . E coli bacteremia 06/18/2011  . ESRD (end stage renal disease) ([place]) 06/12/2011  . UTI (lower urinary tract infection) 06/12/2011  . Bacteremia due to Gram-negative  bacteria 05/23/2011  . History of renal transplantation 05/22/2011  . Septic shock(785.52) 05/22/2011  . Acute on chronic kidney failure ([place]) 05/22/2011  . Gastroparesis 04/24/2008  . WEIGHT LOSS 08/24/2007  . NAUSEA AND VOMITING 08/24/2007    Conditions to be addressed/monitored per PCP order:  chronic healthcare managemnt needs, ESRD, CHF, GERD, chronic pain, tobacco use, anxiety, gout.  Care Plan : General Plan of Care (Adult)  Updates made by Gayla Medicus, RN since 05/15/2020 12:00 AM    Problem: Health Promotion or Disease Self-Management (General Plan of Care) Resolved 05/15/2020  Priority: High  Onset Date: 09/13/2019  Note:   CARE PLAN ENTRY Medicaid Managed Care (see longitudinal plan of care for additional care plan information)  Current Barriers:  . Care coordination needs related to physical mobility and therapy need . Patient [facility]  SNF after short-term stay for rehab. Her facility SW states she was discharged with home health - unable to determine agency. Patient has not started services with an agency. [name] Update 11/22/2019: Patient stated she has not heard anything from her PCP regarding physical therapy nor has she been contacted.  [name] Update 12/22/19: Patient states she does not need HHPT services at this time . Update 03/20/20:  Patient awaiting approval of home health aide from [facility]. [name] Update 04/17/20:  Patient has been approved for 22 hours a month-will receive services three times a week-[facility]  Clinical Social Work Clinical Goal(s):  [name] Over the next 14 days, patient will work with CM team to address needs related to home physical therapy. Patient needs help her with her back pain. [name] Update 11/22/2019: Over the next 60 days patient will work with the Managed Medicaid team to address her needs related to home physical therapy. [name] Update 12/22/19:  Patient states she does not need HHPT services right now-she utilizes walker and cane as needed in addition to prescribed medications.  Interventions: . Inter-disciplinary care team collaboration (see longitudinal plan of care) . Patient interviewed and appropriate assessments performed . Referred to [place] colleague for follow-up regarding home care needs.  [name] Update 11/22/2019: LCSW will collaborate with PCP regarding patient's concern and explain that a referral may need to be resubmitted. [name] Update 12/22/19:  Patient states she would like additional DME-walker with seat.  RNCM will contact PCP regarding this possibility. [name] Update 01/24/20:  patient states she has not heard anything from her PCP, RNCM will follow up today. [name] Update 12/25/2019: Patient states she doesn't think she needs physical therapy. She stated she had an examination recently that showed calcification on her knee. She also states she now knows that the trouble she has walking is sometimes  due to her gout.   Patient Self Care Activities:  . Patient will work with CM team over the next 30 days. [name] Update 11/22/2019:  Patient stated she doesn't know the reason she has not been contacted about physical therapy.     Follow Up:  Patient agrees to Care Plan  Plan: Patient has been referred to [facility] or Medicaid for further case management needs as patient no longer has Managed Medicaid.  Patient is aware to follow up with her provider for ongoing needs.

## 2020-05-15 NOTE — Patient Instructions (Signed)
Visit Information  Patient verbalizes understanding of instructions provided today.   The patient will call provider or case manager at [facility] or Medicaid as needed for further services as patient no longer has Managed Medicaid plan.   Tina Raider RN, BSN Tina Watkins  [facility] - Managed Medicaid High Risk [phone].  Following is a copy of your plan of care:    Patient Care Plan: General Plan of Care (Adult)    Problem Identified: Health Promotion or Disease Self-Management (General Plan of Care)     Long-Range Goal: Self-Management Plan Developed   Start Date: 01/24/2020  Expected End Date: 06/17/2020  Recent Progress: Not on track  Priority: High  Note:   Current Barriers:  . Chronic Disease Management support and education needs.  Patient awaiting to hear from her PCP regarding approval of home health aide with [employer].  [name] Update 04/17/20:  Patient has been approved for 22 hours a month.  Will have help three days a week-[facility] [employer].  Nurse Case Manager Clinical Goal(s):  [name] Over the next 90 days, patient will attend all scheduled medical appointments: . Over the next 90 days, patient will work with CM team pharmacist to review medications.  Interventions:  . Inter-disciplinary care team collaboration (see longitudinal plan of care) . Advised patient to follow up with PCP regarding approval of home health aide with [employer].Completed. . Reviewed medications with patient. . Discussed plans with patient for ongoing care management follow up and provided patient with direct contact information for care management team . Reviewed scheduled/upcoming provider appointments.  Patient Goals/Self-Care Activities Over the next 90 days, patient will:  -Self administers medications as prescribed Attends all scheduled provider appointments Calls pharmacy for medication refills Calls provider office for new  concerns or questions  Follow Up Plan:  Patient will follow up with provider as needed or case management services  with Medicare and/or Medicaid as patient no longer has Managed Medicaid plan.    Evidence-based guidance:    Review biopsychosocial determinants of health screens.   Review need for preventive screening based on age, sex, family history and health history.   Determine level of modifiable health risk.   Discuss identified risks.   Identify areas where behavior change may lead to improved health.   Promote healthy lifestyle.   Evoke change talk using open-ended questions, pros and cons, as well as looking forward.   Identify and manage conditions or preconditions to reduce health risk.   Implement additional goals and interventions based on identified risk factors.

## 2020-05-17 ENCOUNTER — Other Ambulatory Visit (HOSPITAL_COMMUNITY): Payer: Self-pay | Admitting: Physician Assistant

## 2020-05-19 NOTE — Progress Notes (Signed)
05/19/2020 Tina Watkins [id number] 28-Oct-1965   Chief Complaint: Nausea, vomiting   History of Present Illness: Tina Watkins. 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   She is followed by Dr. Ardis Hughs. She was last seen in our office by Nicoletta Ba PA-C on 03/08/2019 for further evaluation regarding abdominal swelling with associated decreased appetite. At that time, she was advised to continue Metoclopramide 63m bid, Zofran 477mQ 6 hrs PRN and Omeprazole 4058mD. An abdominal sonogram was done on 03/20/2019 which showed  mild nodularity of the liver capsule, with coarsened increased echotexture likely representing cirrhosis. No ascites. No focal liver abnormalities. Portal vein was patent.   She presents to our office today to request Omeprazole refill and to discuss scheduling a colonoscopy as she received a letter from our office which indicated she was due for a colonoscopy. She complains of having increased gastroparesis symptoms, nausea and vomiting for the past 1 to 2 months. She was admitted to the hospital 2/24-2/25/2022 with atrial fibrillation with RVR with associated fever. She was started received Cardizem IV which resulted in hypotension with resolved after IVFs were administered. She was started on Amiodarone. She received  IV antibiotics (blood cultures grew  Micrococcus luteus/lylae).  TEE without vegetation or significant findings.  Overall her status stabilized and she was discharged home on 03/29/2020.  She remains on Eliquis and Amiodarone. Her increased nausea preceded the  initiation of Amiodarone.  No CP or palpitations. She previously took Omeprazole and Reglan 5mg46m bid which she discontinued in April or June 2021 when she was switched to Famotidine 20mg50mbid. She stated her N/V symptoms were better controlled when she previously took Omeprazole. She described having frequent nausea, vomits partially digested food she ate the day before or nonbloody bilious emesis. No hematemesis. Her stomach often feels distended. No significant upper or lower abdominal pain. When she has an attack of N/V, she vomits until her stomach is emptied which occurs once weekly for the past 1 to 2 months. She continues with hemodialysis every Tues/Thurs an Saturday. Her N/V is worse in the evenings on her dialysis days which extends into the next morning. She stated she is often dialyzed below her dry weight. She has a right femoral catheter for dialysis as she has poor vascular access. She urinates 4 to 6 times daily. She is passing a normal brown formed stool daily. No blood or black stools. She [facility] which identified one 3mm t83mlar adenomatous polyp which was removed from the splenic flexure.  Her most recent EGD was 04/21/2012 which identified nonspecific distal gastritis and a small amount of retained solid food was present in the stomach.  She underwent an abdominal/pelvic CT scan without contrast 03/28/2020 without evidence of intra-abdominal/pelvic inflammatory or infectious process.  No bowel obstruction.  Laboratory studies 03/29/2020: Sodium 137.  Potassium 4.8.  Glucose 90.  BUN 41.  Creatinine 5.34.  Alk phos 161.  Lipase 73.  AST 25.  ALT 20.  Total bili 0.8.  WBC 5.4.  Hemoglobin 10.7.  Hematocrit 33.4.  MCV 89.1.  Platelet 215.  Abdominal Imaging:   CTAP WO contrast 03/28/2020: 1. Patient has had surgical removal of native kidneys. There is calcification in the right pelvis consistent with failed renal transplant. A renal transplant  in the upper right pelvis appear stable compared to most recent studies without hydronephrosis or perinephric fluid. Urinary bladder appears unremarkable.  2. No bowel obstruction. No abscess in the abdomen or pelvis. No periappendiceal region inflammatory change. By report, patient has had appendectomy.  3. There is a right femoral venous catheter with tip in the inferior vena cava. There are prominent azygos and hemiazygous venous structures, a stable finding compared to prior studies.  4. Mild retroperitoneal adenopathy, stable. No new lymph node enlargement.  5. Aortic Atherosclerosis (ICD10-I70.0). Extensive pelvic arterial vascular calcification noted.  6. Calcification in the left adrenal may be indicative of prior inflammation in this area. No adrenal enlargement.  7.  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  Abdominal sonogram 03/20/2019: 1. Unremarkable right lower quadrant transplant kidney in a patient with a history of bilateral nephrectomies. 2. Heterogeneous liver echotexture, with nodular liver capsule suggesting cirrhosis. 3. Otherwise unremarkable exam.  Gastric Empty Study 03/28/2008: Slightly delayed gastric emptying. Normal is less than 30% at 2 hours. [name] retention at 2 hours was 32%.    Past GI Procedures:  Colonoscopy 10/13/2012 Dr. Harley Hallmark at [facility]: There was one 3 mm splenic flexure tubular adenomatous polyp removed with biopsy forceps and retrieved.  The colon otherwise appeared normal.  Retroflexion could not be performed in the rectum, 4 views appear normal.  The scope was then completely withdrawn from the patient and the procedure terminated.  The bowel preparation was good..    EGD 04/21/2012: There are several areas of yellow-whitish exudate in the esophagus which did not wash free with flushes.  There was mild nonspecific distal gastritis.  This was biopsied and sent to pathology.  There was a small amount of  retained solid food.  The examination was otherwise normal. Biopsies consistent with candidiasis esophagitis treated with Fluconazole x 10 days. Biopsy Report: -GASTRIC BODY AND ANTRAL TYPE MUCOSA WITH ASSOCIATED MINIMAL CHRONIC INFLAMMATION. - NO EVIDENCE OF HELICOBACTER PYLORI, INTESTINAL METAPLASIA, DYSPLASIA, OR MALIGNANCY.  EGD 09/29/2007 with MAC: Normal colon proximal esophagus to duodenal second portion 2 cm hiatal hernia Otherwise normal examination  EGD 09/01/2007 with moderate sedation: Patient assessed and found to be appropriate for moderate (conscious) sedation. Fentanyl 50 mcg. given IV. Versed 6 mg. given IV. Promethazine 25 mg given IV.  She was combative, unsedatable and so after three attempts to intubate her esophagus, the examination was stopped.    CBC Latest Ref Rng & Units 03/29/2020 03/28/2020 03/28/2020  WBC 4.0 - 10.5 K/uL 5.4 - 7.8  Hemoglobin 12.0 - 15.0 g/dL 10.7(L) 12.6 11.9(L)  Hematocrit 36.0 - 46.0 % 33.4(L) 37.0 37.2  Platelets 150 - 400 K/uL 215 - 226  MCV 89.1.   CMP Latest Ref Rng & Units 03/29/2020 03/28/2020 03/28/2020  Glucose 70 - 99 mg/dL 90 80 80  BUN 6 - 20 mg/dL 41(H) 34(H) 31(H)  Creatinine 0.44 - 1.00 mg/dL 5.34(H) 4.70(H) 4.51(H)  Sodium 135 - 145 mmol/L 137 135 136  Potassium 3.5 - 5.1 mmol/L 4.8 3.8 3.8  Chloride 98 - 111 mmol/L 99 97(L) 96(L)  CO2 22 - 32 mmol/L 21(L) - 22  Calcium 8.9 - 10.3 mg/dL 9.7 - 9.5  Total Protein 6.5 - 8.1 g/dL - - 7.8  Total Bilirubin 0.3 - 1.2 mg/dL - - 0.8  Alkaline Phos 38 - 126 U/L - - 161(H)  AST 15 - 41 U/L - - 25  ALT 0 - 44 U/L - - 20  Lipase  73.   ECHO 03/29/2020: 1. Left ventricular ejection fraction, by estimation, is 65 to 70%. The left ventricle has normal function. The left ventricle has no regional wall motion abnormalities. Left ventricular diastolic parameters were normal. 2. Right ventricular systolic function is normal. The right ventricular size is normal. There is normal  pulmonary artery systolic pressure. 3. The mitral valve is normal in structure. No evidence of mitral valve regurgitation. No evidence of mitral stenosis. 4. The aortic valve is normal in structure. Aortic valve regurgitation is not visualized. No aortic stenosis is present. 5. The inferior vena cava is normal in size with greater than 50% respiratory variability, suggesting right atrial pressure of 3 mmHg.  Past Medical History:  Diagnosis Date  . Bacteremia due to Gram-negative bacteria 05/23/2011  . Blind    right eye  . Blind right eye   . CHF (congestive heart failure) ([place])   . Chronic lower back pain   . Complication of anesthesia    "woke up during OR; I have an extremely high tolerance" (12/11/2011) 1 procedure was graft; the other procedures were procedures that are typically done with sedation.  . DDD (degenerative disc disease), cervical   . Depression   . Dysrhythmia    "tachycardia" (12/11/2011) new onset afib 10/15/14 EKG  . E coli bacteremia 06/18/2011  . Elevated LDL cholesterol level 12/2018  . ESRD (end stage renal disease) ([place]) 06/12/2011  . Fibromyalgia   . Gastroparesis   . Gastroparesis   . GERD (gastroesophageal reflux disease)   . Glaucoma    right eye  . Gout   . H/O carpal tunnel syndrome   . Headache 10/2019  . Headache(784.0)    "not often anymore" (12/11/2011)  . Herpes genitalia 1994  . History of blood transfusion    "more than a few times" (12/11/2011)  . History of stomach ulcers   . Hypotension   . Iron deficiency anemia   . New onset a-fib ([place])    10/15/14 EKG  . Osteopenia   . Peripheral neuropathy 11/2018  . Pressure ulcer of sacral region, stage 1 07/2019  . Seizures ([place]) 1994   "post transplant; only have had that one" (12/11/2011)  . Spinal stenosis in cervical region   . Stroke [facility])     left basal ganglia lacunar infarct; Right frontal lobe lacunar infarct.  . Stroke [facility]) ~ 1999; 2001   "briefly lost my vision; lost my right  eye" (12/11/2011)  . Vitamin D deficiency 12/2018   Current Outpatient Medications on File Prior to Visit  Medication Sig Dispense Refill  . allopurinol (ZYLOPRIM) 100 MG tablet Take 1 tablet (100 mg total) by mouth daily. 90 tablet 3  . amiodarone (PACERONE) 200 MG tablet Take 1 tablet (200 mg total) by mouth daily. 60 tablet 1  . apixaban (ELIQUIS) 2.5 MG TABS tablet Take 1 tablet (2.5 mg total) by mouth 2 (two) times daily. 60 tablet 3  . B Complex-C-Zn-Folic Acid (DIALYVITE 287-OMVE 15) 0.8 MG TABS Take 1 tablet by mouth daily.    . busPIRone (BUSPAR) 10 MG tablet TAKE 1/2 TO 1 TABLET BY MOUTH TWICE DAILY    . calcium acetate, Phos Binder, (PHOSLYRA) 667 MG/5ML SOLN Take 2,001 mg by mouth 3 (three) times daily  with meals.    . citalopram (CELEXA) 20 MG tablet SMARTSIG:1 Tablet(s) By Mouth Every Evening    . Colchicine 0.6 MG CAPS Take 0.6 mg by mouth daily as needed (gout flare up).    . diclofenac Sodium (VOLTAREN) 1 % GEL Apply 2 g topically 4 (four) times daily. 150 g 5  . diphenhydrAMINE (BENADRYL) 25 mg capsule Take 1 capsule (25 mg total) by mouth every 8 (eight) hours as needed. 30 capsule 6  . famotidine (PEPCID) 20 MG tablet Take 1 tablet (20 mg total) by mouth 2 (two) times daily. 60 tablet 11  . gabapentin (NEURONTIN) 100 MG capsule Take 100 mg by mouth 2 (two) times daily.     [name] latanoprost (XALATAN) 0.005 % ophthalmic solution Place 1 drop into the left eye at bedtime.  3  . lidocaine (LIDODERM) 5 % Place 1 patch onto the skin daily as needed (for pain- remove old patch first).     . Methoxy PEG-Epoetin Beta (MIRCERA IJ) Mircera    . metoCLOPramide (REGLAN) 5 MG tablet Take 1 tablet (5 mg total) by mouth 2 (two) times daily before a meal. 180 tablet 3  . midodrine (PROAMATINE) 10 MG tablet Take 1.5 tablets (15 mg total) by mouth 3 (three) times daily. 405 tablet 3  . ondansetron (ZOFRAN-ODT) 4 MG disintegrating tablet Take 1 tablet (4 mg total) by mouth every 8 (eight) hours as  needed for nausea or vomiting. 20 tablet 11  . oxyCODONE-acetaminophen (PERCOCET) 10-325 MG tablet Take 1 tablet by mouth 4 (four) times daily as needed for pain.    . predniSONE (DELTASONE) 5 MG tablet Take 5 mg by mouth daily with breakfast.    . Vitamin D, Ergocalciferol, (DRISDOL) 1.25 MG (50000 UNIT) CAPS capsule TAKE 1 CAPSULE (50,000 UNITS TOTAL) BY MOUTH EVERY 7 (SEVEN) DAYS. 5 capsule 6  . zolpidem (AMBIEN) 10 MG tablet Take 1 tablet (10 mg total) by mouth at bedtime as needed for sleep. 10 tablet 0   No current facility-administered medications on file prior to visit.   Allergies  Allergen Reactions  . Levofloxacin Itching and Rash  . Tape Other (See Comments)    "Certain surgical tapes peel off my skin"   Current Medications, Allergies, Past Medical History, Past Surgical History, Family History and Social History were reviewed in [employer] record.  Review of Systems:   Constitutional: Negative for fever, sweats, chills or weight loss.  Respiratory: Negative for shortness of breath.   Cardiovascular: Negative for chest pain, palpitations and leg swelling.  Gastrointestinal: See HPI.  Musculoskeletal: Negative for back pain or muscle aches.  Neurological: Negative for dizziness, headaches or paresthesias.   Physical Exam: BP 98/60   Pulse 67   Ht _0  (1.499 m)   Wt 115 lb (52.2 kg)   SpO2 95%   BMI 23.23 kg/m  Wt Readings from Last 3 Encounters:  05/20/20 115 lb (52.2 kg)  05/10/20 108 lb (49 kg)  04/23/20 102 lb 15.3 oz (46.7 kg)   General: 55 year old female in NAD.  Head: Normocephalic and atraumatic. Eyes: Right eye deformity/enucleation Left pupil reactive to light. Conjunctiva pink . Ears: Normal auditory acuity. Mouth: Upper dentures.  No ulcers or lesions.  Lungs: Clear throughout to auscultation. Heart: Regular rate and rhythm. Systolic murmur.  Abdomen: Soft, nontender and nondistended. Protuberant. No masses or hepatomegaly.  Normal bowel sounds x 4 quadrants. Multiple abdominal scars.  Rectal: Deferred.  Musculoskeletal: Symmetrical with no gross  deformities. Extremities: No edema. Neurological: Alert oriented x 4. No focal deficits.  Psychological: Alert and cooperative. Normal mood and affect  Assessment and Recommendations:  5. 55 year old female with N/V and gastroparesis. CTAP 03/2020 without evidence of inflammatory or infectious intra abdominal/pelvic process. EGD in 2014 showed retained food, no evidence of H. Pylori.  -Reglan 52m po bid -Continue Famotidine 285mpo bid -Add Omeprazole 4046mD -[facility] discuss with Dr. JacArdis Hughsior to scheduling  -Advised 3-4 small snacks sized meals daily -Patient to call our office if symptoms worsen -Patient prefers to have labs done at the time of her dialysis, to provide a lab order for CBC, CMP and lipase level to be drawn with next dialysis session if possible  2. History of a tubular adenomatous colon polyp per colonoscopy 10/2012 -[facility] with Dr. JacArdis Hughsior to scheduling. Colonoscopy benefits and risks discussed including risk with sedation, risk of bleeding, perforation and infection. Patient is a a higher risk for procedure complications secondary to multiple co- morbidities and she likely has adhesions from numerous abdominal surgeries.   3. Chronic anemia secondary to ESRD. No overt GI bleeding  4. ESRD on Hemodialysis via right femoral catheter. -Follow up with nephrologist regarding increased nausea post dialysis, patient reports she is often dialyzed below her dry weight  5. Atrial fibrillation on Eliquis and Amiodarone. Admitted to the hospital 03/28/2020 with atrial fibrillation with RVR -Cardiac clearance required prior to patient proceeding with any endoscopic evaluation   6. Chronic diastolic CHF

## 2020-05-20 ENCOUNTER — Ambulatory Visit (INDEPENDENT_AMBULATORY_CARE_PROVIDER_SITE_OTHER): Payer: Medicare Other | Admitting: Nurse Practitioner

## 2020-05-20 ENCOUNTER — Encounter: Payer: Self-pay | Admitting: Nurse Practitioner

## 2020-05-20 ENCOUNTER — Other Ambulatory Visit: Payer: Self-pay

## 2020-05-20 ENCOUNTER — Encounter (HOSPITAL_COMMUNITY): Payer: Self-pay | Admitting: Cardiology

## 2020-05-20 VITALS — BP 98/60 | HR 67 | Ht 59.0 in | Wt 115.0 lb

## 2020-05-20 DIAGNOSIS — R1013 Epigastric pain: Secondary | ICD-10-CM | POA: Diagnosis not present

## 2020-05-20 DIAGNOSIS — R112 Nausea with vomiting, unspecified: Secondary | ICD-10-CM | POA: Diagnosis not present

## 2020-05-20 NOTE — Patient Instructions (Addendum)
If you are age 53 or younger, your body mass index should be between 19-25. Your Body mass index is 23.23 kg/m. If this is out of the aformentioned range listed, please consider follow up with your Primary Care Provider.   Please continue taking your Reglan 5 MG twice a day and Famotidine 20 MG twice a day.  Start taking Omeprazole 20 MG once a day.  We will contact you regarding Dr. Ardis Hughs recommendations regarding the colonoscopy and endoscopy.  It was great seeing you today! Thank you for entrusting me with your care and choosing [facility].  Noralyn Pick, CRNP

## 2020-05-21 ENCOUNTER — Other Ambulatory Visit: Payer: Self-pay | Admitting: Family Medicine

## 2020-05-22 ENCOUNTER — Other Ambulatory Visit: Payer: Self-pay

## 2020-05-22 ENCOUNTER — Telehealth: Payer: Self-pay | Admitting: Nurse Practitioner

## 2020-05-22 DIAGNOSIS — R1013 Epigastric pain: Secondary | ICD-10-CM

## 2020-05-22 DIAGNOSIS — R112 Nausea with vomiting, unspecified: Secondary | ICD-10-CM

## 2020-05-22 NOTE — Telephone Encounter (Signed)
Spoke with the patient.  She will go to dialysis tomorrow. She uses the [facility] on [address]. [phone]. Spoke with nurse at center. Okay to fax an order. Confirmed they can draw the requested labs. Fax [phone]

## 2020-05-22 NOTE — Telephone Encounter (Signed)
Beth, can you contact the patient's dialysis center and verify if they can do labs at time of her dialysis session any time within the next week? If so, pls provide the dialysis center with a lab order for a CBC, CMP and lipase level. Pt preferred to have any labs done at the dialysis center. Refer to office visit 05/20/2020.

## 2020-05-27 ENCOUNTER — Telehealth: Payer: Self-pay

## 2020-05-27 NOTE — Telephone Encounter (Signed)
Author: Milus Banister, MD Service: Gastroenterology Author Type: Physician  Filed: 05/27/2020 7:22 AM Encounter Date: 05/20/2020 Status: Signed  Editor: Milus Banister, MD (Physician)       Show:Clear all [x] Manual[] Template[] Copied  Added by: [x] Milus Banister, MD   [] Hover for details  I agree with the above note, plan.  Tina Watkins, she will need colonoscopy and EGD at [facility] my next available for polyp surveillance and nausea, vomiting.  Thanks she will need to hold her Eliquis for 2 days prior to the procedure.

## 2020-05-27 NOTE — Telephone Encounter (Signed)
-----   Message from Milus Banister, MD sent at 05/27/2020  7:22 AM EDT -----   ----- Message ----- From: Noralyn Pick, NP Sent: 05/21/2020   8:48 AM EDT To: Milus Banister, MD  Dr. Ardis Hughs, I did not schedule any endoscopic procedures until you to review consult. Let me know if you want patient to be scheduled for EGD/colonoscopy at [facility] or [place] after cardiac clearance received. THX

## 2020-05-27 NOTE — Progress Notes (Signed)
I agree with the above note, plan.  Patty, she will need colonoscopy and EGD at [facility] my next available for polyp surveillance and nausea, vomiting.  Thanks she will need to hold her Eliquis for 2 days prior to the procedure.

## 2020-05-28 NOTE — Progress Notes (Signed)
Yes, I have added the reminder.

## 2020-05-28 NOTE — Telephone Encounter (Signed)
Reminder to call pt endo of May after cardiology work up

## 2020-05-28 NOTE — Telephone Encounter (Signed)
Tina Banister, MD  Noralyn Pick, NP; Timothy Lasso, RN Got it, thanks  Probably best to wait until after her early May cardiology office visit before we finalize this.   Tina Watkins can you put a reminder in the system to reach out to her after her cardiology office visit? Thanks    Thanks

## 2020-05-30 ENCOUNTER — Other Ambulatory Visit (HOSPITAL_COMMUNITY): Payer: Self-pay | Admitting: Nephrology

## 2020-05-30 ENCOUNTER — Other Ambulatory Visit: Payer: Self-pay | Admitting: Radiology

## 2020-05-30 DIAGNOSIS — N186 End stage renal disease: Secondary | ICD-10-CM

## 2020-05-31 ENCOUNTER — Ambulatory Visit (HOSPITAL_COMMUNITY)
Admission: RE | Admit: 2020-05-31 | Discharge: 2020-05-31 | Disposition: A | Discharge status: Home / Self Care | Payer: Medicare Other | Source: Ambulatory Visit | Attending: Nephrology | Admitting: Nephrology

## 2020-05-31 ENCOUNTER — Other Ambulatory Visit (HOSPITAL_COMMUNITY): Payer: Self-pay | Admitting: Nephrology

## 2020-05-31 ENCOUNTER — Other Ambulatory Visit: Payer: Self-pay

## 2020-05-31 DIAGNOSIS — Z4901 Encounter for fitting and adjustment of extracorporeal dialysis catheter: Secondary | ICD-10-CM | POA: Insufficient documentation

## 2020-05-31 DIAGNOSIS — N186 End stage renal disease: Secondary | ICD-10-CM | POA: Diagnosis not present

## 2020-05-31 HISTORY — PX: IR FLUORO GUIDE CV LINE RIGHT: IMG2283

## 2020-05-31 HISTORY — PX: IR VENOCAVAGRAM IVC: IMG678

## 2020-05-31 HISTORY — PX: IR PTA VENOUS EXCEPT DIALYSIS CIRCUIT: IMG6126

## 2020-05-31 MED ORDER — LIDOCAINE HCL 1 % IJ SOLN
INTRAMUSCULAR | Status: AC
  Filled 2020-05-31: qty 20

## 2020-05-31 MED ORDER — IOHEXOL 300 MG/ML  SOLN
50.0000 mL | Freq: Once | INTRAMUSCULAR | Status: AC | PRN
  Administered 2020-05-31: 20 mL via INTRAVENOUS

## 2020-05-31 MED ORDER — CEFAZOLIN SODIUM-DEXTROSE 2-4 GM/100ML-% IV SOLN: 2.0000 g | INTRAVENOUS | Status: AC

## 2020-05-31 MED ORDER — CHLORHEXIDINE GLUCONATE 4 % EX LIQD
CUTANEOUS | Status: AC
  Filled 2020-05-31: qty 15

## 2020-05-31 MED ORDER — HEPARIN SODIUM (PORCINE) 1000 UNIT/ML IJ SOLN
INTRAMUSCULAR | Status: AC
  Filled 2020-05-31: qty 1

## 2020-05-31 MED ORDER — CEFAZOLIN SODIUM-DEXTROSE 2-4 GM/100ML-% IV SOLN
INTRAVENOUS | Status: AC
  Administered 2020-05-31: 2 g via INTRAVENOUS
  Filled 2020-05-31: qty 100

## 2020-05-31 NOTE — Procedures (Signed)
  Procedure: Exchange/revision R femoral HD CVC   EBL:   minimal Complications:  none immediate  See full dictation in [employer].  Dillard Cannon MD Main # [phone] Pager  [phone] Mobile [phone]

## 2020-06-12 ENCOUNTER — Other Ambulatory Visit: Payer: Self-pay

## 2020-06-12 ENCOUNTER — Encounter (HOSPITAL_COMMUNITY): Payer: Self-pay | Admitting: Nurse Practitioner

## 2020-06-12 ENCOUNTER — Ambulatory Visit (HOSPITAL_COMMUNITY)
Admission: RE | Admit: 2020-06-12 | Discharge: 2020-06-12 | Disposition: A | Discharge status: Home / Self Care | Payer: Medicare Other | Source: Ambulatory Visit | Attending: Nurse Practitioner | Admitting: Nurse Practitioner

## 2020-06-12 VITALS — BP 90/60 | HR 65 | Ht 59.0 in | Wt 116.0 lb

## 2020-06-12 DIAGNOSIS — F17210 Nicotine dependence, cigarettes, uncomplicated: Secondary | ICD-10-CM | POA: Diagnosis not present

## 2020-06-12 DIAGNOSIS — Z992 Dependence on renal dialysis: Secondary | ICD-10-CM | POA: Insufficient documentation

## 2020-06-12 DIAGNOSIS — Z940 Kidney transplant status: Secondary | ICD-10-CM | POA: Diagnosis not present

## 2020-06-12 DIAGNOSIS — Z79899 Other long term (current) drug therapy: Secondary | ICD-10-CM | POA: Insufficient documentation

## 2020-06-12 DIAGNOSIS — I132 Hypertensive heart and chronic kidney disease with heart failure and with stage 5 chronic kidney disease, or end stage renal disease: Secondary | ICD-10-CM | POA: Insufficient documentation

## 2020-06-12 DIAGNOSIS — I4892 Unspecified atrial flutter: Secondary | ICD-10-CM | POA: Insufficient documentation

## 2020-06-12 DIAGNOSIS — N186 End stage renal disease: Secondary | ICD-10-CM | POA: Insufficient documentation

## 2020-06-12 DIAGNOSIS — I483 Typical atrial flutter: Secondary | ICD-10-CM

## 2020-06-12 DIAGNOSIS — Z7952 Long term (current) use of systemic steroids: Secondary | ICD-10-CM | POA: Diagnosis not present

## 2020-06-12 DIAGNOSIS — Z7901 Long term (current) use of anticoagulants: Secondary | ICD-10-CM | POA: Insufficient documentation

## 2020-06-12 DIAGNOSIS — I5032 Chronic diastolic (congestive) heart failure: Secondary | ICD-10-CM | POA: Insufficient documentation

## 2020-06-12 DIAGNOSIS — D6869 Other thrombophilia: Secondary | ICD-10-CM | POA: Diagnosis not present

## 2020-06-12 LAB — COMPREHENSIVE METABOLIC PANEL
ALT: 15 U/L (ref 0–44)
AST: 28 U/L (ref 15–41)
Albumin: 3.4 g/dL — ABNORMAL LOW (ref 3.5–5.0)
Alkaline Phosphatase: 134 U/L — ABNORMAL HIGH (ref 38–126)
Anion gap: 10 (ref 5–15)
BUN: 22 mg/dL — ABNORMAL HIGH (ref 6–20)
CO2: 25 mmol/L (ref 22–32)
Calcium: 9.3 mg/dL (ref 8.9–10.3)
Chloride: 98 mmol/L (ref 98–111)
Creatinine, Ser: 6.31 mg/dL — ABNORMAL HIGH (ref 0.44–1.00)
GFR, Estimated: 7 mL/min — ABNORMAL LOW (ref >60)
Glucose, Bld: 90 mg/dL (ref 70–99)
Potassium: 3.8 mmol/L (ref 3.5–5.1)
Sodium: 133 mmol/L — ABNORMAL LOW (ref 135–145)
Total Bilirubin: 0.5 mg/dL (ref 0.3–1.2)
Total Protein: 6.1 g/dL — ABNORMAL LOW (ref 6.5–8.1)

## 2020-06-12 LAB — TSH: TSH: 5.887 u[IU]/mL — ABNORMAL HIGH (ref 0.350–4.500)

## 2020-06-12 MED ORDER — AMIODARONE HCL 200 MG PO TABS: 100.0000 mg | ORAL_TABLET | Freq: Every day | ORAL | 1 refills | Status: DC

## 2020-06-12 NOTE — Progress Notes (Signed)
Primary Care Physician: Vevelyn Francois, NP Referring Physician: AHF AHF MD: Dr. Ciro Backer Tina Watkins is a 55 y.o. female with a h/o ESRD s/p failed renal transplant now on HD TTS, orthostatic hypotension, chronic diastolic CHF, moderate pHTN, HTN and gout brought to ED from outpatient HD with rapid A.flutter and fever.  Reportedly had fever with heart rates in 150s after 1 hour of HD session.  She had 20 mg IV Cardizem push.  RVR resolved but she became hypotensive.  In ED, febrile to 100.5.  No leukocytosis.  UA, CXR and CT abdomen and pelvis without significant finding.  She was hypotensive but blood pressure improved with 500 cc bolus.  She was in sinus rhythm.  Blood cultures obtained.  She was a started on broad-spectrum antibiotics for SIRS.  The next day, blood pressure improved.  Remained in sinus rhythm.  Blood cultures NGTD. TTE without significant finding.  Cleared by nephrology for discharge.  She felt well and asked to go home.  Nephrology to follow-up on further update of her blood culture and address accordingly.  She was seen in AHF clinc, 04/03/20. It was noted that pt's monitor showed less thatn 1% aflutter  burden and was referred here to see if antiarrythmic's were indicated. Ekg today shows NSR. Pt denies having any tachy arrhythmia's. She has not started eliquis as she was waiting  to have 5 teeth extractions end of April. She has a CHA2DS2VASc score of 6 with a previous stroke. She states that when she had Covid last June, that it caused her to lose her kidney transplant that she had for 16 years done in the [place] area. Hence she had to start back on dialysis.   F/u in afib clinic, 05/10/20. She was started on amiodarone on last visit for symptomatic afib with RVR at dialysis. She reports no sustained heart racing. She feels well today. . EKG shows  NSR at 66 bpm.   F/u in afib clinic, 06/12/20. She is pleased with amiodarone as she is not had any further afib  with her dialysis sessions. Obtained cmet/tsh today. In  SR today by EKG. Now on amiodarone 200 mg daily.  Today, she denies symptoms of palpitations, chest pain, shortness of breath, orthopnea, PND, lower extremity edema, dizziness, presyncope, syncope, or neurologic sequela. The patient is tolerating medications without difficulties and is otherwise without complaint today.   Past Medical History:  Diagnosis Date  . Bacteremia due to Gram-negative bacteria 05/23/2011  . Blind    right eye  . Blind right eye   . CHF (congestive heart failure) ([place])   . Chronic lower back pain   . Complication of anesthesia    "woke up during OR; I have an extremely high tolerance" (12/11/2011) 1 procedure was graft; the other procedures were procedures that are typically done with sedation.  . DDD (degenerative disc disease), cervical   . Depression   . Dysrhythmia    "tachycardia" (12/11/2011) new onset afib 10/15/14 EKG  . E coli bacteremia 06/18/2011  . Elevated LDL cholesterol level 12/2018  . ESRD (end stage renal disease) ([place]) 06/12/2011  . Fibromyalgia   . Gastroparesis   . Gastroparesis   . GERD (gastroesophageal reflux disease)   . Glaucoma    right eye  . Gout   . H/O carpal tunnel syndrome   . Headache 10/2019  . Headache(784.0)    "not often anymore" (12/11/2011)  . Herpes genitalia 1994  . History of  blood transfusion    "more than a few times" (12/11/2011)  . History of stomach ulcers   . Hypotension   . Iron deficiency anemia   . New onset a-fib ([place])    10/15/14 EKG  . Osteopenia   . Peripheral neuropathy 11/2018  . Pressure ulcer of sacral region, stage 1 07/2019  . Seizures ([place]) 1994   "post transplant; only have had that one" (12/11/2011)  . Spinal stenosis in cervical region   . Stroke [facility])     left basal ganglia lacunar infarct; Right frontal lobe lacunar infarct.  . Stroke [facility]) ~ 1999; 2001   "briefly lost my vision; lost my right eye" (12/11/2011)  . Vitamin D  deficiency 12/2018   Past Surgical History:  Procedure Laterality Date  . ANTERIOR CERVICAL DECOMP/DISCECTOMY FUSION N/A 01/08/2015   Procedure: Anterior Cervical Three-Four/Four-Five Decompression/Diskectomy/Fusion;  Surgeon: Leeroy Cha, MD;  Location: [place] NEURO ORS;  Service: Neurosurgery;  Laterality: N/A;  C3-4 C4-5 Anterior cervical decompression/diskectomy/fusion  . APPENDECTOMY  ~ 2004  . CATARACT EXTRACTION     right eye  . COLONOSCOPY    . ENUCLEATION  2001   "right"  . ESOPHAGOGASTRODUODENOSCOPY (EGD) WITH PROPOFOL N/A 04/21/2012   Procedure: ESOPHAGOGASTRODUODENOSCOPY (EGD) WITH PROPOFOL;  Surgeon: Milus Banister, MD;  Location: [facility] ENDOSCOPY;  Service: Endoscopy;  Laterality: N/A;  . 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 IR FLUORO GUIDE CV LINE RIGHT  07/11/2019  . IR FLUORO GUIDE CV LINE RIGHT  10/20/2019  . IR FLUORO GUIDE CV LINE RIGHT  05/31/2020  . IR PTA VENOUS EXCEPT DIALYSIS CIRCUIT  05/31/2020  . IR RADIOLOGY PERIPHERAL GUIDED IV START  07/11/2019  . IR US GUIDE VASC ACCESS RIGHT  07/11/2019  . IR US GUIDE VASC ACCESS RIGHT  07/11/2019  . IR US GUIDE VASC ACCESS RIGHT  07/11/2019  . IR VENOCAVAGRAM IVC  05/31/2020  . [place]; 1999; 2005   "right"  . MULTIPLE TOOTH EXTRACTIONS    . RIGHT HEART CATH N/A 03/23/2019   Procedure: RIGHT HEART CATH;  Surgeon: Jolaine Artist, MD;  Location: [place] CV LAB;  Service: Cardiovascular;  Laterality: N/A;  . TONSILLECTOMY    . TOTAL NEPHRECTOMY  1988?; 1994; 2005    Current Outpatient Medications  Medication Sig Dispense Refill  . allopurinol (ZYLOPRIM) 100 MG tablet Take 1 tablet (100 mg total) by mouth daily. 90 tablet 3  . amiodarone (PACERONE) 200 MG tablet Take 1 tablet (200 mg total) by mouth daily. 60 tablet 1  . apixaban (ELIQUIS) 2.5 MG TABS tablet Take 1 tablet (2.5 mg total) by mouth 2 (two) times daily. 60 tablet 3  . B  Complex-C-Zn-Folic Acid (DIALYVITE 676-PPJK 15) 0.8 MG TABS Take 1 tablet by mouth daily.    . busPIRone (BUSPAR) 10 MG tablet TAKE 1/2 TO 1 TABLET BY MOUTH TWICE DAILY    . calcium acetate, Phos Binder, (PHOSLYRA) 667 MG/5ML SOLN Take 2,001 mg by mouth 3 (three) times daily with meals.    . citalopram (CELEXA) 20 MG tablet SMARTSIG:1 Tablet(s) By Mouth Every Evening    . Colchicine 0.6 MG CAPS Take 0.6 mg by mouth daily as needed (gout flare up).    . diclofenac Sodium (VOLTAREN) 1 % GEL Apply 2 g topically 4 (four) times daily. 150 g 5  . diphenhydrAMINE (BENADRYL) 25 mg capsule Take 1 capsule (25 mg  total) by mouth every 8 (eight) hours as needed. 30 capsule 6  . famotidine (PEPCID) 20 MG tablet Take 1 tablet (20 mg total) by mouth 2 (two) times daily. 60 tablet 11  . gabapentin (NEURONTIN) 100 MG capsule TAKE 1 CAPSULE BY MOUTH TWICE A DAY MAX 2 CAPSULES DAILY 60 capsule 6  . latanoprost (XALATAN) 0.005 % ophthalmic solution Place 1 drop into the left eye at bedtime.  3  . lidocaine (LIDODERM) 5 % Place 1 patch onto the skin daily as needed (for pain- remove old patch first).     . Methoxy PEG-Epoetin Beta (MIRCERA IJ) Mircera    . metoCLOPramide (REGLAN) 5 MG tablet Take 1 tablet (5 mg total) by mouth 2 (two) times daily before a meal. 180 tablet 3  . midodrine (PROAMATINE) 10 MG tablet Take 1.5 tablets (15 mg total) by mouth 3 (three) times daily. 405 tablet 3  . ondansetron (ZOFRAN-ODT) 4 MG disintegrating tablet Take 1 tablet (4 mg total) by mouth every 8 (eight) hours as needed for nausea or vomiting. 20 tablet 11  . oxyCODONE-acetaminophen (PERCOCET) 10-325 MG tablet Take 1 tablet by mouth 4 (four) times daily as needed for pain.    . predniSONE (DELTASONE) 5 MG tablet Take 5 mg by mouth daily with breakfast.    . Vitamin D, Ergocalciferol, (DRISDOL) 1.25 MG (50000 UNIT) CAPS capsule TAKE 1 CAPSULE (50,000 UNITS TOTAL) BY MOUTH EVERY 7 (SEVEN) DAYS. 5 capsule 6  . zolpidem (AMBIEN) 10 MG  tablet Take 1 tablet (10 mg total) by mouth at bedtime as needed for sleep. 10 tablet 0   No current facility-administered medications for this encounter.    Allergies  Allergen Reactions  . Levofloxacin Itching and Rash  . Tape Other (See Comments)    "Certain surgical tapes peel off my skin"    Social History   Socioeconomic History  . Marital status: Single    Spouse name: Not on file  . Number of children: 0  . Years of education: some college  . Highest education level: Some college, no degree  Occupational History  . Occupation: disabled    Fish farm manager: UNEMPLOYED  Tobacco Use  . Smoking status: Current Every Day Smoker    Packs/day: 0.33    Years: 35.00    Pack years: 11.55    Types: Cigarettes  . Smokeless tobacco: Never Used  . Tobacco comment: half pack daily  Vaping Use  . Vaping Use: Never used  Substance and Sexual Activity  . Alcohol use: Yes    Alcohol/week: 1.0 standard drink    Types: 1 Standard drinks or equivalent per week    Comment: rare if out to dinner  . Drug use: Yes    Frequency: 1.0 times per week    Types: Marijuana    Comment: smoking once a day when in severe pain  . Sexual activity: Yes    Partners: Male    Birth control/protection: None  Other Topics Concern  . Not on file  Social History Narrative   Right-handed.   Four cups caffeine per day.   Lives alone.   Social Determinants of Health   Financial Resource Strain: Low Risk   . Difficulty of Paying Living Expenses: Not hard at all  Food Insecurity: No Food Insecurity  . Worried About Charity fundraiser in the Last Year: Never true  . Ran Out of Food in the Last Year: Never true  Transportation Needs: No Transportation Needs  . Lack  of Transportation (Medical): No  . Lack of Transportation (Non-Medical): No  Physical Activity: Insufficiently Active  . Days of Exercise per Week: 3 days  . Minutes of Exercise per Session: 10 min  Stress: Stress Concern Present  . Feeling  of Stress : To some extent  Social Connections: Moderately Isolated  . Frequency of Communication with Friends and Family: More than three times a week  . Frequency of Social Gatherings with Friends and Family: More than three times a week  . Attends Religious Services: More than 4 times per year  . Active Member of Clubs or Organizations: No  . Attends Archivist Meetings: Never  . Marital Status: Never married  Intimate Partner Violence: Not At Risk  . Fear of Current or Ex-Partner: No  . Emotionally Abused: No  . Physically Abused: No  . Sexually Abused: No    Family History  Problem Relation Age of Onset  . Glaucoma Mother   . Pancreatic cancer Father   . Multiple sclerosis Brother   . Hypertension Maternal Grandmother   . Breast cancer Neg Hx     ROS- All systems are reviewed and negative except as per the HPI above  Physical Exam: Vitals:   06/12/20 0927  BP: 90/60  Pulse: 65  Weight: 52.6 kg  Height: 4\' 11"  (1.499 m)   Wt Readings from Last 3 Encounters:  06/12/20 52.6 kg  05/20/20 52.2 kg  05/10/20 49 kg    Labs: Lab Results  Component Value Date   NA 137 03/29/2020   K 4.8 03/29/2020   CL 99 03/29/2020   CO2 21 (L) 03/29/2020   GLUCOSE 90 03/29/2020   BUN 41 (H) 03/29/2020   CREATININE 5.34 (H) 03/29/2020   CALCIUM 9.7 03/29/2020   PHOS 3.3 08/24/2019   MG 1.7 06/14/2011   Lab Results  Component Value Date   INR 1.1 03/28/2020   Lab Results  Component Value Date   CHOL 214 (H) 12/06/2018   HDL 89 12/06/2018   LDLCALC 105 (H) 12/06/2018   TRIG 118 12/06/2018     GEN- The patient is well appearing, alert and oriented x 3 today.   Head- normocephalic, atraumatic Eyes-  Sclera clear, conjunctiva pink Ears- hearing intact Oropharynx- clear Neck- supple, no JVP Lymph- no cervical lymphadenopathy Lungs- Clear to ausculation bilaterally, normal work of breathing Heart- Regular rate and rhythm, no murmurs, rubs or gallops, PMI not  laterally displaced GI- soft, NT, ND, + BS Extremities- no clubbing, cyanosis, or edema MS- no significant deformity or atrophy Skin- no rash or lesion Psych- euthymic mood, full affect Neuro- strength and sensation are intact  EKG-NSR at 66 bpm, pr int 174 ms, qrs int 70 ms, qtc 494 ms   Echo-1. Left ventricular ejection fraction, by estimation, is 65 to 70%. The  left ventricle has normal function. The left ventricle has no regional  wall motion abnormalities. Left ventricular diastolic parameters were  normal.  2. Right ventricular systolic function is normal. The right ventricular  size is normal. There is normal pulmonary artery systolic pressure.  3. The mitral valve is normal in structure. No evidence of mitral valve  regurgitation. No evidence of mitral stenosis.  4. The aortic valve is normal in structure. Aortic valve regurgitation is  not visualized. No aortic stenosis is present.  5. The inferior vena cava is normal in size with greater than 50%  respiratory variability, suggesting right atrial pressure of 3 mmHg.   Zio patch-Patient had  a min HR of 52 bpm, max HR of 208 bpm, and avg HR of 78 bpm. Predominant underlying rhythm was Sinus Rhythm. Atrial Flutter occurred (<1% burden), ranging from 113-208 bpm (avg of 148 bpm), the longest lasting 1 hour 16 mins with an avg rate of 139 bpm. Atrial Flutter was detected within +/- 45 seconds of symptomatic patient event(s).  Stress test- 04/12/20-Study Highlights    The left ventricular ejection fraction is hyperdynamic (>65%).  Nuclear stress EF: 75%.  There was no ST segment deviation noted during stress.  Defect 1: There is a medium defect of mild severity present in the mid anterior location.  The study is normal.  This is a low risk study.  EKG from ER reviewed and showed atrial flutter at 2:1, probably typical  Assessment and Plan: 1. Atrial  flutter  In SR No further afib episodes   Continue  amiodarone at 200 mg daily Tsh/cmet today   2. CHA2DS2VASc score of 6 Continue eliquis 2.5 mg bid  Off ASA Eliquis 2.5 mg bid for weight less than 60 KG and ESRD  F/u in afib clinic in 3 months  Could possibly be an ablation candidate in the future   Butch Penny C. Kelcey Wickstrom, [facility] [address] [place], [place] [address] [phone]

## 2020-06-12 NOTE — Addendum Note (Signed)
Encounter addended by: Juluis Mire, RN on: 06/12/2020 3:13 PM  Actions taken: Order list changed

## 2020-06-27 ENCOUNTER — Telehealth: Payer: Self-pay

## 2020-06-27 NOTE — Telephone Encounter (Signed)
-----   Message from Timothy Lasso, RN sent at 05/28/2020  1:13 PM EDT ----- Milus Banister, MD  Noralyn Pick, NP; Timothy Lasso, RN Got it, thanks  Probably best to wait until after her early May cardiology office visit before we finalize this.   Kaithlyn Teagle can you put a reminder in the system to reach out to her after her cardiology office visit? Thanks    Thanks    Documentation   You Yesterday (8:39 AM)       Author: Milus Banister, MD Service: Gastroenterology Author Type: Physician Filed: 05/27/2020 7:22 AM Encounter Date: 05/20/2020 Status: Signed Editor: Milus Banister, MD (Physician)

## 2020-06-27 NOTE — Telephone Encounter (Signed)
Dr Ardis Hughs please review cardiology note dated 5/11. We were waiting for pt to have offic evisit with cardiology prior to proceeding with colon endo.  See Colleen's note from 4/18 [facility], she will need colonoscopy and EGD at [facility] my next available for polyp surveillance and nausea, vomiting.  Thanks she will need to hold her Eliquis for 2 days prior to the procedure.) Please advise

## 2020-07-03 ENCOUNTER — Other Ambulatory Visit: Payer: Self-pay

## 2020-07-03 ENCOUNTER — Ambulatory Visit (HOSPITAL_COMMUNITY)
Admission: RE | Admit: 2020-07-03 | Discharge: 2020-07-03 | Disposition: A | Discharge status: Home / Self Care | Payer: Medicare Other | Source: Ambulatory Visit | Attending: Physician Assistant | Admitting: Physician Assistant

## 2020-07-03 DIAGNOSIS — I4892 Unspecified atrial flutter: Secondary | ICD-10-CM | POA: Insufficient documentation

## 2020-07-03 LAB — TSH: TSH: 3.867 u[IU]/mL (ref 0.350–4.500)

## 2020-07-03 NOTE — Telephone Encounter (Signed)
Cardiology note was favorable.  I think we can go ahead and schedule her for my first available [facility] colonoscopy and EGD as previously planned.  She will need to hold her Eliquis for 2 days prior.  Thank you

## 2020-07-04 NOTE — Telephone Encounter (Signed)
Left message on machine to call back  

## 2020-07-05 NOTE — Telephone Encounter (Signed)
Spoke with the pt and she would like to call back at a later time to set up the procedure. I have provided her with the number to return call.

## 2020-07-08 NOTE — Telephone Encounter (Signed)
Patient is returning your call.  

## 2020-07-08 NOTE — Telephone Encounter (Signed)
Left message on machine to call back  

## 2020-07-08 NOTE — Telephone Encounter (Signed)
Patient returned call. Left message to call back

## 2020-07-09 NOTE — Telephone Encounter (Signed)
I spoke with the pt and she is at dialysis and wants to call back at a better time.

## 2020-07-10 ENCOUNTER — Telehealth: Payer: Self-pay

## 2020-07-10 ENCOUNTER — Other Ambulatory Visit: Payer: Self-pay

## 2020-07-10 DIAGNOSIS — R1900 Intra-abdominal and pelvic swelling, mass and lump, unspecified site: Secondary | ICD-10-CM

## 2020-07-10 DIAGNOSIS — I700 Atherosclerosis of aorta: Secondary | ICD-10-CM | POA: Insufficient documentation

## 2020-07-10 DIAGNOSIS — R1013 Epigastric pain: Secondary | ICD-10-CM

## 2020-07-10 DIAGNOSIS — R198 Other specified symptoms and signs involving the digestive system and abdomen: Secondary | ICD-10-CM

## 2020-07-10 DIAGNOSIS — R112 Nausea with vomiting, unspecified: Secondary | ICD-10-CM

## 2020-07-10 MED ORDER — PEG 3350-KCL-NA BICARB-NACL 420 G PO SOLR: 4000.0000 mL | Freq: Once | ORAL | 0 refills | Status: AC

## 2020-07-10 NOTE — Telephone Encounter (Signed)
Okay to hold Eliquis for colonoscopy.  She may stop it 48 hours before the procedure.  She may resume it when it is safe per gastroenterology.  [place] T. Audie Box, MD, [place]  [address] [place],  [address] [phone]  4:56 PM

## 2020-07-10 NOTE — Telephone Encounter (Signed)
Patient with diagnosis of afib on Eliquis for anticoagulation.    Procedure: colonoscopy/endoscopy Date of procedure: 09/12/20  CHA2DS2-VASc Score = 5  This indicates a 7.2% annual risk of stroke. The patient's score is based upon: CHF History: Yes HTN History: No (on PMH but pt has consistently been hypotensive) Diabetes History: No Stroke History: Yes Vascular Disease History: Yes Age Score: 0 Gender Score: 1   Pt on dialysis Platelet count 215K  Will forward to MD for input regarding length of Eliquis hold in setting of ESRD on dialysis with hx of stroke.

## 2020-07-10 NOTE — Telephone Encounter (Signed)
[facility] Pre-operative Risk Assessment     Request for surgical clearance:     Endoscopy Procedure  What type of surgery is being performed?     Colon Endo  When is this surgery scheduled?     09/12/20  What type of clearance is required ?   Pharmacy  Are there any medications that need to be held prior to surgery and how long? Eliquis  Practice name and name of physician performing surgery?      [facility]  What is your office phone and fax number?      Phone- [phone]  [phone]  Anesthesia type (None, local, MAC, general) ?       MAC

## 2020-07-10 NOTE — Telephone Encounter (Signed)
The pt has been scheduled for 8/11 at [facility] with Dr Ardis Hughs for colon endo.  She has been advised and instructed. I will also send the information to My Chart. Anti coag letter sent to prescriber.

## 2020-07-11 NOTE — Telephone Encounter (Signed)
   Primary Cardiologist: Evalina Field, MD  Chart reviewed as part of pre-operative protocol coverage. Given past medical history and time since last visit,  Tina Watkins has received the following recommendations from Dr. Eleonore Chiquito .   Patient with diagnosis of afib on Eliquis for anticoagulation.     Procedure: colonoscopy/endoscopy Date of procedure: 09/12/20   CHA2DS2-VASc Score = 5  This indicates a 7.2% annual risk of stroke. The patient's score is based upon: CHF History: Yes HTN History: No (on PMH but pt has consistently been hypotensive) Diabetes History: No Stroke History: Yes Vascular Disease History: Yes Age Score: 0 Gender Score: 1   Pt on dialysis Platelet count 215K   She may hold her Eliquis for 48 hours before the procedure.  She may resume it when it is safe per gastroenterology.  I will route this recommendation to the requesting party via Epic fax function and remove from pre-op pool.  Please call with questions.  Jossie Ng. Quantasia Stegner NP-C    07/11/2020, 6:53 AM [place] [address] Office [phone] Fax [phone]

## 2020-07-17 ENCOUNTER — Ambulatory Visit: Payer: Self-pay | Admitting: Nurse Practitioner

## 2020-07-17 ENCOUNTER — Ambulatory Visit: Payer: Medicaid Other | Admitting: Family Medicine

## 2020-07-18 ENCOUNTER — Encounter (HOSPITAL_COMMUNITY): Payer: Self-pay | Admitting: Emergency Medicine

## 2020-07-18 ENCOUNTER — Other Ambulatory Visit: Payer: Self-pay

## 2020-07-18 ENCOUNTER — Emergency Department (HOSPITAL_COMMUNITY): Payer: Medicare Other

## 2020-07-18 ENCOUNTER — Emergency Department (HOSPITAL_COMMUNITY)
Admission: EM | Admit: 2020-07-18 | Discharge: 2020-07-18 | Disposition: A | Discharge status: Home / Self Care | Payer: Medicare Other | Attending: Emergency Medicine | Admitting: Emergency Medicine

## 2020-07-18 DIAGNOSIS — F17210 Nicotine dependence, cigarettes, uncomplicated: Secondary | ICD-10-CM | POA: Diagnosis not present

## 2020-07-18 DIAGNOSIS — U071 COVID-19: Secondary | ICD-10-CM

## 2020-07-18 DIAGNOSIS — N186 End stage renal disease: Secondary | ICD-10-CM | POA: Diagnosis not present

## 2020-07-18 DIAGNOSIS — D6869 Other thrombophilia: Secondary | ICD-10-CM | POA: Diagnosis not present

## 2020-07-18 DIAGNOSIS — I509 Heart failure, unspecified: Secondary | ICD-10-CM | POA: Insufficient documentation

## 2020-07-18 DIAGNOSIS — Z8616 Personal history of COVID-19: Secondary | ICD-10-CM | POA: Diagnosis not present

## 2020-07-18 DIAGNOSIS — Z720 Tobacco use: Secondary | ICD-10-CM

## 2020-07-18 DIAGNOSIS — R079 Chest pain, unspecified: Secondary | ICD-10-CM

## 2020-07-18 DIAGNOSIS — Z7901 Long term (current) use of anticoagulants: Secondary | ICD-10-CM | POA: Insufficient documentation

## 2020-07-18 DIAGNOSIS — Z992 Dependence on renal dialysis: Secondary | ICD-10-CM | POA: Diagnosis not present

## 2020-07-18 LAB — CBC
HCT: 35.4 % — ABNORMAL LOW (ref 36.0–46.0)
Hemoglobin: 10.6 g/dL — ABNORMAL LOW (ref 12.0–15.0)
MCH: 29.1 pg (ref 26.0–34.0)
MCHC: 29.9 g/dL — ABNORMAL LOW (ref 30.0–36.0)
MCV: 97.3 fL (ref 80.0–100.0)
Platelets: 175 10*3/uL (ref 150–400)
RBC: 3.64 MIL/uL — ABNORMAL LOW (ref 3.87–5.11)
RDW: 23 % — ABNORMAL HIGH (ref 11.5–15.5)
WBC: 4.4 10*3/uL (ref 4.0–10.5)
nRBC: 0 % (ref 0.0–0.2)

## 2020-07-18 LAB — BASIC METABOLIC PANEL
Anion gap: 14 (ref 5–15)
BUN: 26 mg/dL — ABNORMAL HIGH (ref 6–20)
CO2: 24 mmol/L (ref 22–32)
Calcium: 9.1 mg/dL (ref 8.9–10.3)
Chloride: 94 mmol/L — ABNORMAL LOW (ref 98–111)
Creatinine, Ser: 5.29 mg/dL — ABNORMAL HIGH (ref 0.44–1.00)
GFR, Estimated: 9 mL/min — ABNORMAL LOW (ref >60)
Glucose, Bld: 105 mg/dL — ABNORMAL HIGH (ref 70–99)
Potassium: 3.3 mmol/L — ABNORMAL LOW (ref 3.5–5.1)
Sodium: 132 mmol/L — ABNORMAL LOW (ref 135–145)

## 2020-07-18 LAB — RESP PANEL BY RT-PCR (FLU A&B, COVID) ARPGX2
Influenza A by PCR: NEGATIVE
Influenza B by PCR: NEGATIVE
SARS Coronavirus 2 by RT PCR: POSITIVE — AB

## 2020-07-18 LAB — TROPONIN I (HIGH SENSITIVITY)
Troponin I (High Sensitivity): 14 ng/L (ref <18)
Troponin I (High Sensitivity): 15 ng/L (ref <18)

## 2020-07-18 MED ORDER — DOXYCYCLINE HYCLATE 100 MG PO TABS
100.0000 mg | ORAL_TABLET | Freq: Once | ORAL | Status: AC
  Administered 2020-07-18: 100 mg via ORAL
  Filled 2020-07-18: qty 1

## 2020-07-18 MED ORDER — SODIUM CHLORIDE 0.9 % IV SOLN
1.0000 g | Freq: Once | INTRAVENOUS | Status: AC
  Administered 2020-07-18: 1 g via INTRAVENOUS
  Filled 2020-07-18: qty 10

## 2020-07-18 MED ORDER — FAMOTIDINE IN NACL 20-0.9 MG/50ML-% IV SOLN: 20.0000 mg | Freq: Once | INTRAVENOUS | Status: DC | PRN

## 2020-07-18 MED ORDER — BEBTELOVIMAB 175 MG/2 ML IV (EUA)
175.0000 mg | Freq: Once | INTRAMUSCULAR | Status: AC
  Administered 2020-07-18: 175 mg via INTRAVENOUS
  Filled 2020-07-18: qty 2

## 2020-07-18 MED ORDER — DIPHENHYDRAMINE HCL 50 MG/ML IJ SOLN: 50.0000 mg | Freq: Once | INTRAMUSCULAR | Status: DC | PRN

## 2020-07-18 MED ORDER — METHYLPREDNISOLONE SODIUM SUCC 125 MG IJ SOLR: 125.0000 mg | Freq: Once | INTRAMUSCULAR | Status: DC | PRN

## 2020-07-18 MED ORDER — EPINEPHRINE 0.3 MG/0.3ML IJ SOAJ: 0.3000 mg | Freq: Once | INTRAMUSCULAR | Status: DC | PRN

## 2020-07-18 MED ORDER — ALBUTEROL SULFATE HFA 108 (90 BASE) MCG/ACT IN AERS: 2.0000 | INHALATION_SPRAY | Freq: Once | RESPIRATORY_TRACT | Status: DC | PRN

## 2020-07-18 MED ORDER — SODIUM CHLORIDE 0.9 % IV SOLN: INTRAVENOUS | Status: DC | PRN

## 2020-07-18 NOTE — ED Provider Notes (Addendum)
[facility] EMERGENCY DEPARTMENT Provider Note   CSN: [id number] Arrival date & time: 07/18/20  0160     History Chief Complaint  Patient presents with   Chest Pain    Tina Watkins is a 55 y.o. female.  She states it feels like she has pneumonia.  She has been extremely fatigued and weak as well.  She did miss her dialysis today but states that she has been all the other days.  The history is provided by the patient.  Chest Pain Pain location:  L chest Pain quality: pressure   Pain radiates to:  Does not radiate Pain severity:  Moderate Onset quality:  Gradual Duration:  1 week Timing:  Constant Progression:  Unchanged Chronicity:  New Context: at rest   Relieved by:  Nothing Worsened by:  Deep breathing, coughing, movement and exertion Ineffective treatments:  None tried Associated symptoms: cough and shortness of breath   Associated symptoms: no abdominal pain, no back pain, no fever, no palpitations and no vomiting       Past Medical History:  Diagnosis Date   Bacteremia due to Gram-negative bacteria 05/23/2011   Blind    right eye   Blind right eye    CHF (congestive heart failure) (HCC)    Chronic lower back pain    Complication of anesthesia    "woke up during OR; I have an extremely high tolerance" (12/11/2011) 1 procedure was graft; the other procedures were procedures that are typically done with sedation.   DDD (degenerative disc disease), cervical    Depression    Dysrhythmia    "tachycardia" (12/11/2011) new onset afib 10/15/14 EKG   E coli bacteremia 06/18/2011   Elevated LDL cholesterol level 12/2018   ESRD (end stage renal disease) ([place]) 06/12/2011   Fibromyalgia    Gastroparesis    Gastroparesis    GERD (gastroesophageal reflux disease)    Glaucoma    right eye   Gout    H/O carpal tunnel syndrome    Headache 10/2019   Headache(784.0)    "not often anymore" (12/11/2011)   Herpes genitalia 1994   History of  blood transfusion    "more than a few times" (12/11/2011)   History of stomach ulcers    Hypotension    Iron deficiency anemia    New onset a-fib ([place])    10/15/14 EKG   Osteopenia    Peripheral neuropathy 11/2018   Pressure ulcer of sacral region, stage 1 07/2019   Seizures ([place]) 1994   "post transplant; only have had that one" (12/11/2011)   Spinal stenosis in cervical region    Stroke [facility])     left basal ganglia lacunar infarct; Right frontal lobe lacunar infarct.   Stroke [facility]) ~ 1999; 2001   "briefly lost my vision; lost my right eye" (12/11/2011)   Vitamin D deficiency 12/2018    Patient Active Problem List   Diagnosis Date Noted   Aortic atherosclerosis ([place]) 07/10/2020   Secondary hypercoagulable state ([place]) 04/23/2020   Atrial flutter ([place]) 03/29/2020   Chronic pain of both knees 03/13/2020   Anemia 09/02/2019   Decubitus ulcer of sacral region, stage 1 08/18/2019   HCAP (healthcare-associated pneumonia) 08/05/2019   Pressure injury of skin 07/24/2019   Chest congestion 07/24/2019   Itching 07/24/2019   Weakness 06/13/2019   Pneumonia due to COVID-19 virus 05/09/2019   Hypotension 05/09/2019   Symptomatic anemia 05/09/2019   CHF (congestive heart failure) ([place]) 05/09/2019   Swollen abdomen  01/04/2019   DDD (degenerative disc disease), cervical 12/06/2018   Neuropathy 12/06/2018   Paresthesia 10/11/2018   Neck pain 10/11/2018   Tobacco abuse 11/20/2017   Right leg swelling 11/20/2017   CKD (chronic kidney disease), stage III ([place]) 11/20/2017   Right leg pain 11/20/2017   Stroke (cerebrum) ([place]) 11/20/2017   GERD (gastroesophageal reflux disease) 11/20/2017   Acute venous embolism and thrombosis of deep vessels of proximal lower extremity, right ([place]) 11/20/2017   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    Thrush, oral    AKI (acute kidney injury) ([place])    Multifocal pneumonia 08/09/2017   Chest pain 09/29/2015    Cervical stenosis of spinal canal 01/08/2015   Urinary tract infectious disease    Muscle spasms of neck 06/15/2014   Bleeding hemorrhoid 06/15/2014   Anemia in chronic kidney disease 06/15/2014   Pyelonephritis, acute 06/13/2014   Sepsis ([place]) 06/13/2014   History of kidney transplant    Chronic pain syndrome 04/09/2012   Dehydration, mild 04/09/2012   Gout attack 06/23/2011   Herpes infection 06/23/2011   Anxiety 06/23/2011   E coli bacteremia 06/18/2011   ESRD (end stage renal disease) ([place]) 06/12/2011   UTI (lower urinary tract infection) 06/12/2011   Bacteremia due to Gram-negative bacteria 05/23/2011   History of renal transplantation 05/22/2011   Septic shock(785.52) 05/22/2011   Acute on chronic kidney failure ([place]) 05/22/2011   Gastroparesis 04/24/2008   WEIGHT LOSS 08/24/2007   NAUSEA AND VOMITING 08/24/2007    Past Surgical History:  Procedure Laterality Date   ANTERIOR CERVICAL DECOMP/DISCECTOMY FUSION N/A 01/08/2015   Procedure: Anterior Cervical Three-Four/Four-Five Decompression/Diskectomy/Fusion;  Surgeon: Leeroy Cha, MD;  Location: [facility] NEURO ORS;  Service: Neurosurgery;  Laterality: N/A;  C3-4 C4-5 Anterior cervical decompression/diskectomy/fusion   APPENDECTOMY  ~ 2004   CATARACT EXTRACTION     right eye   COLONOSCOPY     ENUCLEATION  2001   "right"   ESOPHAGOGASTRODUODENOSCOPY (EGD) WITH PROPOFOL N/A 04/21/2012   Procedure: ESOPHAGOGASTRODUODENOSCOPY (EGD) WITH PROPOFOL;  Surgeon: Milus Banister, MD;  Location: [facility] ENDOSCOPY;  Service: Endoscopy;  Laterality: N/A;   INSERTION OF DIALYSIS CATHETER  1988   "AV graft LUA & LFA; LUA worked for 1 day; LFA never worked"   IR FLUORO GUIDE CV LINE RIGHT  07/11/2019   IR FLUORO GUIDE CV LINE RIGHT  10/20/2019   IR FLUORO GUIDE CV LINE RIGHT  05/31/2020   IR PTA VENOUS EXCEPT DIALYSIS CIRCUIT  05/31/2020   IR RADIOLOGY PERIPHERAL GUIDED IV START  07/11/2019   IR US GUIDE VASC ACCESS RIGHT  07/11/2019   IR US GUIDE VASC  ACCESS RIGHT  07/11/2019   IR US GUIDE VASC ACCESS RIGHT  07/11/2019   IR VENOCAVAGRAM IVC  05/31/2020   KIDNEY TRANSPLANT  1994; 1999; 2005   "right"   MULTIPLE TOOTH EXTRACTIONS     RIGHT HEART CATH N/A 03/23/2019   Procedure: RIGHT HEART CATH;  Surgeon: Jolaine Artist, MD;  Location: [place] CV LAB;  Service: Cardiovascular;  Laterality: N/A;   [place]?; 1994; 2005     OB History     Gravida  1   Para      Term      Preterm      AB  1   Living         SAB  1   IAB  Ectopic      Multiple      Live Births              Family History  Problem Relation Age of Onset   Glaucoma Mother    Pancreatic cancer Father    Multiple sclerosis Brother    Hypertension Maternal Grandmother    Breast cancer Neg Hx     Social History   Tobacco Use   Smoking status: Every Day    Packs/day: 0.33    Years: 35.00    Pack years: 11.55    Types: Cigarettes   Smokeless tobacco: Never   Tobacco comments:    half pack daily  Vaping Use   Vaping Use: Never used  Substance Use Topics   Alcohol use: Yes    Alcohol/week: 1.0 standard drink    Types: 1 Standard drinks or equivalent per week    Comment: rare if out to dinner   Drug use: Yes    Frequency: 1.0 times per week    Types: Marijuana    Comment: smoking once a day when in severe pain    Home Medications Prior to Admission medications   Medication Sig Start Date End Date Taking? Authorizing Provider  allopurinol (ZYLOPRIM) 100 MG tablet Take 1 tablet (100 mg total) by mouth daily. 01/13/20   [name], FNP  amiodarone (PACERONE) 200 MG tablet Take 0.5 tablets (100 mg total) by mouth daily. 06/12/20   Sherran Needs, NP  apixaban (ELIQUIS) 2.5 MG TABS tablet Take 1 tablet (2.5 mg total) by mouth 2 (two) times daily. 04/03/20   [place], Maricela Bo, FNP  B Complex-C-Zn-Folic Acid (DIALYVITE 366-YQIH 15) 0.8 MG TABS Take 1 tablet by mouth daily. 02/20/20   [provider]  busPIRone (BUSPAR) 10 MG tablet TAKE 1/2 TO 1 TABLET BY MOUTH TWICE DAILY 03/29/20   [provider]  calcium acetate, Phos Binder, (PHOSLYRA) 667 MG/5ML SOLN Take 2,001 mg by mouth 3 (three) times daily with meals.    [provider]  citalopram (CELEXA) 20 MG tablet SMARTSIG:1 Tablet(s) By Mouth Every Evening 03/29/20   [provider]  Colchicine 0.6 MG CAPS Take 0.6 mg by mouth daily as needed (gout flare up).    [provider]  diclofenac Sodium (VOLTAREN) 1 % GEL Apply 2 g topically 4 (four) times daily. 03/13/20   Leandrew Koyanagi, MD  diphenhydrAMINE (BENADRYL) 25 mg capsule Take 1 capsule (25 mg total) by mouth every 8 (eight) hours as needed. 07/21/19   [name], FNP  famotidine (PEPCID) 20 MG tablet Take 1 tablet (20 mg total) by mouth 2 (two) times daily. 06/13/19   [name], FNP  gabapentin (NEURONTIN) 100 MG capsule TAKE 1 CAPSULE BY MOUTH TWICE A DAY MAX 2 CAPSULES DAILY 05/22/20   Vevelyn Francois, NP  latanoprost (XALATAN) 0.005 % ophthalmic solution Place 1 drop into the left eye at bedtime. 03/30/17   [provider]  lidocaine (LIDODERM) 5 % Place 1 patch onto the skin daily as needed (for pain- remove old patch first).  10/28/18   [provider]  Methoxy PEG-Epoetin Beta (MIRCERA IJ) Mircera 09/14/19 09/12/20  [provider]  metoCLOPramide (REGLAN) 5 MG tablet Take 1 tablet (5 mg total) by mouth 2 (two) times daily before a meal. 05/09/20   Milus Banister, MD  midodrine (PROAMATINE) 10 MG tablet Take 1.5 tablets (15 mg total) by mouth 3 (three) times daily. 03/22/20  Bensimhon, Shaune Pascal, MD  ondansetron (ZOFRAN-ODT) 4 MG disintegrating tablet Take 1 tablet (4 mg total) by mouth every 8 (eight) hours as needed for nausea or vomiting. 06/13/19   [name], FNP  oxyCODONE-acetaminophen (PERCOCET) 10-325 MG tablet Take 1 tablet by mouth 4 (four) times daily as needed for pain. 09/08/19    [provider]  predniSONE (DELTASONE) 5 MG tablet Take 5 mg by mouth daily with breakfast.    [provider]  Vitamin D, Ergocalciferol, (DRISDOL) 1.25 MG (50000 UNIT) CAPS capsule TAKE 1 CAPSULE (50,000 UNITS TOTAL) BY MOUTH EVERY 7 (SEVEN) DAYS. 01/17/20   [name], FNP  zolpidem (AMBIEN) 10 MG tablet Take 1 tablet (10 mg total) by mouth at bedtime as needed for sleep. 08/24/19   [name], DO    Allergies    Levofloxacin and Tape  Review of Systems   Review of Systems  Constitutional:  Negative for chills and fever.  HENT:  Negative for ear pain and sore throat.   Eyes:  Negative for pain and visual disturbance.  Respiratory:  Positive for cough and shortness of breath.   Cardiovascular:  Positive for chest pain. Negative for palpitations.  Gastrointestinal:  Negative for abdominal pain and vomiting.  Genitourinary:  Negative for dysuria and hematuria.  Musculoskeletal:  Negative for arthralgias and back pain.  Skin:  Negative for color change and rash.  Neurological:  Negative for seizures and syncope.  All other systems reviewed and are negative.  Physical Exam Updated Vital Signs BP (!) 102/58 (BP Location: Left Arm)   Pulse 68   Temp 98.7 F (37.1 C) (Oral)   Resp 18   Ht 4\' 11"  (1.499 m)   Wt 47.2 kg   SpO2 100%   BMI 21.01 kg/m   Physical Exam Vitals and nursing note reviewed.  Constitutional:      General: She is not in acute distress.    Appearance: She is well-developed.  HENT:     Head: Normocephalic and atraumatic.  Eyes:     Conjunctiva/sclera: Conjunctivae normal.  Cardiovascular:     Rate and Rhythm: Normal rate and regular rhythm.     Heart sounds: No murmur heard. Pulmonary:     Effort: Pulmonary effort is normal. No respiratory distress.     Breath sounds: Examination of the right-upper field reveals rhonchi. Examination of the left-upper field reveals rhonchi. Examination of the right-middle field reveals  rhonchi. Examination of the left-middle field reveals rhonchi. Examination of the right-lower field reveals rhonchi. Examination of the left-lower field reveals rhonchi. Rhonchi present.  Abdominal:     Palpations: Abdomen is soft.     Tenderness: There is no abdominal tenderness.  Musculoskeletal:     Cervical back: Neck supple.  Skin:    General: Skin is warm and dry.  Neurological:     General: No focal deficit present.     Mental Status: She is alert.  Psychiatric:        Mood and Affect: Mood normal.        Behavior: Behavior normal.    ED Results / Procedures / Treatments   Labs (all labs ordered are listed, but only abnormal results are displayed) Labs Reviewed  RESP PANEL BY RT-PCR (FLU A&B, COVID) ARPGX2 - Abnormal; Notable for the following components:      Result Value   SARS Coronavirus 2 by RT PCR POSITIVE (*)    All other components within normal limits  BASIC METABOLIC PANEL -  Abnormal; Notable for the following components:   Sodium 132 (*)    Potassium 3.3 (*)    Chloride 94 (*)    Glucose, Bld 105 (*)    BUN 26 (*)    Creatinine, Ser 5.29 (*)    GFR, Estimated 9 (*)    All other components within normal limits  CBC - Abnormal; Notable for the following components:   RBC 3.64 (*)    Hemoglobin 10.6 (*)    HCT 35.4 (*)    MCHC 29.9 (*)    RDW 23.0 (*)    All other components within normal limits  TROPONIN I (HIGH SENSITIVITY)  TROPONIN I (HIGH SENSITIVITY)    EKG EKG Interpretation  Date/Time:  Thursday July 18 2020 09:30:12 EDT Ventricular Rate:  68 PR Interval:  178 QRS Duration: 70 QT Interval:  486 QTC Calculation: 516 R Axis:   60 Text Interpretation: Normal sinus rhythm Lateral infarct , age undetermined Prolonged QT Abnormal ECG similar to prior Confirmed by Lorre Munroe ([id number]) on 07/18/2020 10:35:46 AM  Radiology DG Chest 1 View  Result Date: 07/18/2020 CLINICAL DATA:  Chest pain and cough EXAM: CHEST  1 VIEW COMPARISON:  March 28, 2020 FINDINGS: There is ill-defined airspace opacity in the medial right base. There is interstitial thickening in the lungs bilaterally. Heart size and pulmonary vascularity are normal. No adenopathy. Superior vena cava stent present. No appreciable adenopathy. Old healed fracture right posterior sixth rib. Postoperative change lower cervical spine. Surgical clips in upper abdomen noted. IMPRESSION: Ill-defined airspace opacity consistent with pneumonia medial right base. Suspect a degree of underlying chronic bronchitis. Stable cardiac silhouette. No adenopathy appreciable. Electronically Signed   By: Lowella Grip III M.D.   On: 07/18/2020 10:20    Procedures Procedures   Medications Ordered in ED Medications  0.9 %  sodium chloride infusion (has no administration in time range)  bebtelovimab EUA injection SOLN 175 mg (has no administration in time range)  diphenhydrAMINE (BENADRYL) injection 50 mg (has no administration in time range)  famotidine (PEPCID) IVPB 20 mg premix (has no administration in time range)  methylPREDNISolone sodium succinate (SOLU-MEDROL) 125 mg/2 mL injection 125 mg (has no administration in time range)  albuterol (VENTOLIN HFA) 108 (90 Base) MCG/ACT inhaler 2 puff (has no administration in time range)  EPINEPHrine (EPI-PEN) injection 0.3 mg (has no administration in time range)  cefTRIAXone (ROCEPHIN) 1 g in sodium chloride 0.9 % 100 mL IVPB (0 g Intravenous Stopped 07/18/20 1210)  doxycycline (VIBRA-TABS) tablet 100 mg (100 mg Oral Given 07/18/20 1100)    ED Course  I have reviewed the triage vital signs and the nursing notes.  Pertinent labs & imaging results that were available during my care of the patient were reviewed by me and considered in my medical decision making (see chart for details).    MDM Rules/Calculators/A&P                          Tina Watkins presented with cough and shortness of breath.  Initially, x-ray revealed pneumonia,  and given her history of past, severe pneumonia, I gave her antibiotics.  However, her COVID-19 test returned positive.  She has renal failure and is outside the 5-day window, and I think she will be best served by monoclonal antibodies given her comorbidities as well as the time during which she presented for evaluation.  She will be given these antibodies and discharged home.  She  had no hypoxia here in the ED and was able to ambulate without hypoxia.  Of note, I considered PE in my differential.  She has a history of DVT, and COVID-19 is a hypercoagulable state.  However, she is anticoagulated fully.  I was not highly suspicious of this diagnosis, and deferred ordering a d-dimer. Final Clinical Impression(s) / ED Diagnoses Final diagnoses:  COVID-19  ESRD (end stage renal disease) ([place])  Tobacco abuse  Secondary hypercoagulable state [facility])    Rx / DC Orders ED Discharge Orders     None        Arnaldo Natal, MD 07/18/20 1434    Arnaldo Natal, MD 07/18/20 1436

## 2020-07-18 NOTE — Progress Notes (Signed)
Renal Navigator notes patient's positive COVID test and plan for antibodies and discharge from ED. Navigator called patient's HD Clinic Manager/Garber Tyrone Sage to inform. EDP note and COVID test result faxed to clinic for continuity of care.  Navigator sent epic message to ED RN to request that she inform patient to return to her HD clinic at her regularly scheduled appointments, but that she arrive to the side door of the clinic and call the center from the parking lot. A staff member will bring her in. She will be dialyzed separately in the isolation bay. Navigator attempted to call patient to discuss, but she did not answer and outgoing message did not identify her. HIPAA compliant message left requesting call back.  Navigator aware that patient missed HD today, however, clinic manager does not have an available appointment to accommodate patient tomorrow. She suggests having patient call the clinic in the morning to see if they can work her in to make up missed treatment today and asked that patient be sure to come to regularly scheduled treatment on Saturday.  Information has been put in AVS.   Alphonzo Cruise, [place] Renal Navigator [phone]

## 2020-07-18 NOTE — ED Triage Notes (Signed)
Pt presents with c/o severe chest pain on the left chest without radiation-it is reproducible. She also reports a cough x1 week along with missing dialysis today. Denies fevers.

## 2020-07-18 NOTE — ED Notes (Signed)
Pulse ox checked while pt ambulatory in room. o2 sats 94-98% room air , HR 80-90. Pt does c/o feeling SHOB with ambulation.

## 2020-07-18 NOTE — ED Provider Notes (Signed)
Emergency Medicine Provider Triage Evaluation Note  Tina Watkins , a 55 y.o. female  was evaluated in triage.  Pt complains of chest pain for 1 week. Also reports cough, sob. She missed dialysis today.  Review of Systems  Positive: Cough, cp, sob Negative: fever  Physical Exam  BP (!) 102/58 (BP Location: Left Arm)   Pulse 68   Temp 97.6 F (36.4 C) (Oral)   Resp 18   SpO2 100%  Gen:   Awake, no distress   Resp:  Normal effort  MSK:   Moves extremities without difficulty  Other:  Ttp to the left chest wall, rrr, coarse lung sounds and expiratory wheezing noted. No ble edema  Medical Decision Making  Medically screening exam initiated at 9:37 AM.  Appropriate orders placed.  [name] was informed that the remainder of the evaluation will be completed by another provider, this initial triage assessment does not replace that evaluation, and the importance of remaining in the ED until their evaluation is complete.     Bishop Dublin 07/18/20 8309    Arnaldo Natal, MD 07/18/20 1141

## 2020-07-19 ENCOUNTER — Encounter: Payer: Self-pay | Admitting: Nurse Practitioner

## 2020-07-19 ENCOUNTER — Telehealth: Payer: Self-pay | Admitting: *Deleted

## 2020-07-19 ENCOUNTER — Telehealth (INDEPENDENT_AMBULATORY_CARE_PROVIDER_SITE_OTHER): Payer: Medicare Other | Admitting: Nurse Practitioner

## 2020-07-19 ENCOUNTER — Ambulatory Visit (HOSPITAL_COMMUNITY): Payer: Medicare Other | Admitting: Nurse Practitioner

## 2020-07-19 DIAGNOSIS — Z720 Tobacco use: Secondary | ICD-10-CM | POA: Diagnosis not present

## 2020-07-19 DIAGNOSIS — R5382 Chronic fatigue, unspecified: Secondary | ICD-10-CM | POA: Diagnosis not present

## 2020-07-19 DIAGNOSIS — N186 End stage renal disease: Secondary | ICD-10-CM | POA: Diagnosis not present

## 2020-07-19 DIAGNOSIS — U071 COVID-19: Secondary | ICD-10-CM | POA: Diagnosis not present

## 2020-07-19 DIAGNOSIS — Z992 Dependence on renal dialysis: Secondary | ICD-10-CM

## 2020-07-19 MED ORDER — NIRMATRELVIR/RITONAVIR (PAXLOVID) TABLET (RENAL DOSING): 2.0000 | ORAL_TABLET | Freq: Two times a day (BID) | ORAL | 0 refills | Status: AC

## 2020-07-19 MED ORDER — VITAMELTS ENERGY VITAMIN B-12 1500 MCG PO TBDP: 1.0000 | ORAL_TABLET | Freq: Every day | ORAL | 0 refills | Status: DC

## 2020-07-19 MED ORDER — NICOTINE 21 MG/24HR TD PT24: 21.0000 mg | MEDICATED_PATCH | Freq: Every day | TRANSDERMAL | 3 refills | Status: AC

## 2020-07-19 NOTE — Telephone Encounter (Signed)
Transition Care Management Follow-up Telephone Call Date of discharge and from where: 07/18/2020 - [facility] How have you been since you were released from the hospital? "I am okay" Any questions or concerns? No  Items Reviewed: Did the pt receive and understand the discharge instructions provided? Yes  Medications obtained and verified? Yes  Other? No  Any new allergies since your discharge? No  Dietary orders reviewed? No Do you have support at home? Yes    Functional Questionnaire: (I = Independent and D = Dependent) ADLs: I  Bathing/Dressing- I  Meal Prep- I  Eating- I  Maintaining continence- I  Transferring/Ambulation- I  Managing Meds- I  Follow up appointments reviewed:  PCP Hospital f/u appt confirmed? Yes  Scheduled to see Dionisio David, NP on 07/19/2020 @ 1530. [place] [facility] f/u appt confirmed? No   Are transportation arrangements needed? No  If their condition worsens, is the pt aware to call PCP or go to the Emergency Dept.? Yes Was the patient provided with contact information for the PCP's office or ED? Yes Was to pt encouraged to call back with questions or concerns? Yes

## 2020-07-19 NOTE — Patient Instructions (Signed)
COVID-19: Quarantine and Isolation Quarantine If you were exposed Quarantine and stay away from others when you have been in close contact with someone whohas COVID-19. Isolate If you are sick or test positive Isolate when you are sick or when you have COVID-19, even if you don't have symptoms. When to stay home Calculating quarantine The date of your exposure is considered day 0. Day 1 is the first full day after your last contact with a person who has had COVID-19. Stay home and away from other people for at least 5 days. Learn why CDC updated guidance for the general public. IF YOU were exposed to COVID-19 and are NOT up-to-date IF YOU were exposed to COVID-19 and are NOT on COVID-19 vaccinations Quarantine for at least 5 days Stay home Stay home and quarantine for at least 5 full days. Wear a well-fitted mask if you must be around others in your home. Do not travel. Get tested Even if you don't develop symptoms, get tested at least 5 days after you last had close contact with someone with COVID-19. After quarantine Watch for symptoms Watch for symptoms until 10 days after you last had close contact with someone with COVID-19. Avoid travel It is best to avoid travel until a full 10 days after you last had close contact with someone with COVID-19. If you develop symptoms Isolate immediately and get tested. Continue to stay home until you know the results. Wear a well-fitted mask around others. Take precautions until day 10 Wear a mask Wear a well-fitted mask for 10 full days any time you are around others inside your home or in public. Do not go to places where you are unable to wear a mask. If you must travel during days 6-10, take precautions. Avoid being around people who are at high risk IF YOU were exposed to COVID-19 and are up-to-date IF YOU were exposed to COVID-19 and are on COVID-19 vaccinations No quarantine You do not need to stay home unless you develop  symptoms. Get tested Even if you don't develop symptoms, get tested at least 5 days after you last had close contact with someone with COVID-19. Watch for symptoms Watch for symptoms until 10 days after you last had close contact with someone with COVID-19. If you develop symptoms Isolate immediately and get tested. Continue to stay home until you know the results. Wear a well-fitted mask around others. Take precautions until day 10 Wear a mask Wear a well-fitted mask for 10 full days any time you are around others inside your home or in public. Do not go to places where you are unable to wear a mask. Take precautions if traveling Avoid being around people who are at high risk IF YOU were exposed to COVID-19 and had confirmed COVID-19 within the past 90 days (you tested positive using a viral test) No quarantine You do not need to stay home unless you develop symptoms. Watch for symptoms Watch for symptoms until 10 days after you last had close contact with someone with COVID-19. If you develop symptoms Isolate immediately and get tested. Continue to stay home until you know the results. Wear a well-fitted mask around others. Take precautions until day 10 Wear a mask Wear a well-fitted mask for 10 full days any time you are around others inside your home or in public. Do not go to places where you are unable to wear a mask. Take precautions if traveling Avoid being around people who are at high risk Calculating isolation  Day 0 is your first day of symptoms or a positive viral test. Day 1 is the first full day after your symptoms developed or your test specimen was collected. If you have COVID-19 or have symptoms, isolate for at least 5 days. IF YOU tested positive for COVID-19 or have symptoms, regardless of vaccination status Stay home for at least 5 days Stay home for 5 days and isolate from others in your home. Wear a well-fitted mask if you must be around others in your home. Do not  travel. Ending isolation if you had symptoms End isolation after 5 full days if you are fever-free for 24 hours (without the use of fever-reducing medication) and your symptoms are improving. Ending isolation if you did NOT have symptoms End isolation after at least 5 full days after your positive test. If you were severely ill with COVID-19 or are immunocompromised You should isolate for at least 10 days. Consult your doctor before ending isolation. Take precautions until day 10 Wear a mask Wear a well-fitted mask for 10 full days any time you are around others inside your home or in public. Do not go to places where you are unable to wear a mask. Do not travel Do not travel until a full 10 days after your symptoms started or the date your positive test was taken if you had no symptoms. Avoid being around people who are at high risk Definitions Exposure Contact with someone infected with SARS-CoV-2, the virus that causes COVID-19,in a way that increases the likelihood of getting infected with the virus. Close contact A close contact is someone who was less than 6 feet away from an infected person (laboratory-confirmed or a clinical diagnosis) for a cumulative total of 15 minutes or more over a 24-hour period. For example, three individual 5-minute exposures for a total of 15 minutes. People who are exposed to someone with COVID-19 after they completed at least 5 days of isolation are notconsidered close contacts. Julio Sicks is a strategy used to prevent transmission of COVID-19 by keeping people who have been in close contact with someone with COVID-19 apart from others. Who does not need to quarantine? If you had close contact with someone with COVID-19 and you are in one of the following groups, you do not need to quarantine. You are up to date with your COVID-19 vaccines. You had confirmed COVID-19 within the last 90 days (meaning you tested positive using a viral test). You  should wear a well-fitting mask around others for 10 days from the date of your last close contact with someone with COVID-19 (the date of last close contact is considered day 0). Get tested at least 5 days after you last had close contact with someone with COVID-19. If you test positive or develop COVID-19 symptoms, isolate from other people and follow recommendations in the Isolation section below. If you tested positive for COVID-19 with a viral test within the previous 90 days and subsequently recovered and remain without COVID-19 symptoms, you do not need to quarantine or get tested after close contact. You should wear a well-fitting mask around others for 10 days from the date of your last close contact withsomeone with COVID-19 (the date of last close contact is considered day 0). Who should quarantine? If you come into close contact with someone with COVID-19, you should quarantine if you are not up to date on COVID-19 vaccines. This includes people who are not vaccinated. What to do for quarantine Stay home and away  from other people for at least 5 days (day 0 through day 5) after your last contact with a person who has COVID-19. The date of your exposure is considered day 0. Wear a well-fitting mask when around others at home, if possible. For 10 days after your last close contact with someone with COVID-19, watch for fever (100.66F or greater), cough, shortness of breath, or other COVID-19 symptoms. If you develop symptoms, get tested immediately and isolate until you receive your test results. If you test positive, follow isolation recommendations. If you do not develop symptoms, get tested at least 5 days after you last had close contact with someone with COVID-19. If you test negative, you can leave your home, but continue to wear a well-fitting mask when around others at home and in public until 10 days after your last close contact with someone with COVID-19. If you test positive, you should  isolate for at least 5 days from the date of your positive test (if you do not have symptoms). If you do develop COVID-19 symptoms, isolate for at least 5 days from the date your symptoms began (the date the symptoms started is day 0). Follow recommendations in the isolation section below. If you are unable to get a test 5 days after last close contact with someone with COVID-19, you can leave your home after day 5 if you have been without COVID-19 symptoms throughout the 5-day period. Wear a well-fitting mask for 10 days after your date of last close contact when around others at home and in public. Avoid people who are immunocompromised or at high risk for severe disease, and nursing homes and other high-risk settings, until after at least 10 days. If possible, stay away from people you live with, especially people who are at higher risk for getting very sick from COVID-19, as well as others outside your home throughout the full 10 days after your last close contact with someone with COVID-19. If you are unable to quarantine, you should wear a well-fitting mask for 10 days when around others at home and in public. If you are unable to wear a mask when around others, you should continue to quarantine for 10 days. Avoid people who are immunocompromised or at high risk for severe disease, and nursing homes and other high-risk settings, until after at least 10 days. See additional information about travel. Do not go to places where you are unable to wear a mask, such as restaurants and some gyms, and avoid eating around others at home and at work until after 10 days after your last close contact with someone with COVID-19. After quarantine Watch for symptoms until 10 days after your last close contact with someone with COVID-19. If you have symptoms, isolate immediately and get tested. Quarantine in high-risk congregate settings In certain congregate settings that have high risk of secondary transmission  (such as Systems analyst and detention facilities, homeless shelters, or cruise ships), CDC recommends a 10-day quarantine for residents, regardless of vaccination and booster status. During periods of critical staffing shortages, facilities may consider shortening the quarantine period for staff to ensure continuity of operations. Decisions to shorten quarantine in these settings should be made in consultation with state, local, tribal, or territorial health departments and should take into consideration the context and characteristics of the facility. CDC's setting-specific guidance provides additional recommendations for these settings. Isolation Isolation is used to separate people with confirmed or suspected COVID-19 from those without COVID-19. People who are in isolation should stay  home until it's safe for them to be around others. At home, anyone sick or infected should separate from others, or wear a well-fitting mask when they need to be around others. People in isolation should stay in a specific "sick room" or area and use a separate bathroom if available. Everyone who has presumed or confirmed COVID-19 should stay home and isolate from other people for at least 5 full days (day 0 is the first day of symptoms or the date of the day of the positive viral test for asymptomatic persons). They should wear a mask when around others at home and in public for an additional 5 days. People who are confirmed to have COVID-19 or are showing symptoms of COVID-19 need to isolate regardless of their vaccination status. This includes: People who have a positive viral test for COVID-19, regardless of whether or not they have symptoms. People with symptoms of COVID-19, including people who are awaiting test results or have not been tested. People with symptoms should isolate even if they do not know if they have been in close contact with someone with COVID-19. What to do for isolation Monitor your symptoms. If you  have an emergency warning sign (including trouble breathing), seek emergency medical care immediately. Stay in a separate room from other household members, if possible. Use a separate bathroom, if possible. Take steps to improve ventilation at home, if possible. Avoid contact with other members of the household and pets. Don't share personal household items, like cups, towels, and utensils. Wear a well-fitting mask when you need to be around other people. Learn more about what to do if you are sick and how to notify your contacts. Ending isolation for people who had COVID-19 and had symptoms If you had COVID-19 and had symptoms, isolate for at least 5 days. To calculate your 5-day isolation period, day 0 is your first day of symptoms. Day 1 is the first full day after your symptoms developed. You can leave isolation after 5 full days. You can end isolation after 5 full days if you are fever-free for 24 hours without the use of fever-reducing medication and your other symptoms have improved (Loss of taste and smell may persist for weeks or months after recovery and need not delay the end of isolation). You should continue to wear a well-fitting mask around others at home and in public for 5 additional days (day 6 through day 10) after the end of your 5-day isolation period. If you are unable to wear a mask when around others, you should continue to isolate for a full 10 days. Avoid people who are immunocompromised or at high risk for severe disease, and nursing homes and other high-risk settings, until after at least 10 days. If you continue to have fever or your other symptoms have not improved after 5 days of isolation, you should wait to end your isolation until you are fever-free for 24 hours without the use of fever-reducing medication and your other symptoms have improved. Continue to wear a well-fitting mask. Contact your healthcare provider if you have questions. See additional information about  travel. Do not go to places where you are unable to wear a mask, such as restaurants and some gyms, and avoid eating around others at home and at work until a full 10 days after your first day of symptoms. If an individual has access to a test and wants to test, the best approach is to use an antigen test1 towards the end of  the 5-day isolation period. Collect the test sample only if you are fever-free for 24 hours without the use of fever-reducing medication and your other symptoms have improved (loss of taste and smell may persist for weeks or months after recovery and need not delay the end of isolation). If your test result is positive, you should continue to isolate until day 10. If your test result is negative, you can end isolation, but continue to wear a well-fitting mask around others at home and in public until day 10. Follow additional recommendations for masking and avoiding travel as described above. 1As noted in the labeling for authorized over-the counter antigen tests: Negative results should be treated as presumptive. Negative results do not rule out SARS-CoV-2 infection and should not be used as the sole basis for treatment or patient management decisions, including infection control decisions. To improve results, antigen tests should be used twice over a three-day period with at least 24 hours and no more than 48 hours between tests. Note that these recommendations on ending isolation do not apply to people with moderate or severe COVID-19 or with weakened immune systems (immunocompromised). See section below for recommendations for when toend isolation for these groups. Ending isolation for people who tested positive for COVID-19 but had no symptoms If you test positive for COVID-19 and never develop symptoms, isolate for at least 5 days. Day 0 is the day of your positive viral test (based on the date you were tested) and day 1 is the first full day after the specimen was collected for your  positive test. You can leave isolation after 5 full days. If you continue to have no symptoms, you can end isolation after at least 5 days. You should continue to wear a well-fitting mask around others at home and in public until day 10 (day 6 through day 10). If you are unable to wear a mask when around others, you should continue to isolate for 10 days. Avoid people who are immunocompromised or at high risk for severe disease, and nursing homes and other high-risk settings, until after at least 10 days. If you develop symptoms after testing positive, your 5-day isolation period should start over. Day 0 is your first day of symptoms. Follow the recommendations above for ending isolation for people who had COVID-19 and had symptoms. See additional information about travel. Do not go to places where you are unable to wear a mask, such as restaurants and some gyms, and avoid eating around others at home and at work until 10 days after the day of your positive test. If an individual has access to a test and wants to test, the best approach is to use an antigen test1 towards the end of the 5-day isolation period. If your test result is positive, you should continue to isolate until day 10. If your test result is negative, you can end isolation, but continue to wear a well-fitting mask around others at home and in public until day 10. Follow additionalrecommendations for masking and avoiding travel as described above. 1As noted in the labeling for authorized over-the counter antigen tests: Negative results should be treated as presumptive. Negative results do not rule out SARS-CoV-2 infection and should not be used as the sole basis for treatment or patient management decisions, including infection control decisions. To improve results, antigen tests should be used twice over a three-day period with at least 24 hours and no more than 48 hours between tests. Ending isolation for people who were  severely ill with  COVID-19 or have a weakened immune system (immunocompromised) People who are severely ill with COVID-19 (including those who were hospitalized or required intensive care or ventilation support) and people with compromised immune systems might need to isolate at home longer. They may also require testing with a viral test to determine when they can be around others. CDC recommends an isolation period of at least 10 and up to 20 days for people who were severely ill with COVID-19 and for people with weakened immune systems. Consult with your healthcare provider about when you can resume being aroundother people. People who are immunocompromised should talk to their healthcare provider about the potential for reduced immune responses to COVID-19 vaccines and the need to continue to follow current prevention measures (including wearing a well-fitting mask, staying 6 feet apart from others they don't live with, and avoiding crowds and poorly ventilated indoor spaces) to protect themselves against COVID-19 until advised otherwise by their healthcare provider. Close contacts of immunocompromised people--including household members--should also be encouraged to receive all recommended COVID-19 vaccine doses to help protect these people. Isolation in high-risk congregate settings In certain high-risk congregate settings that have high risk of secondary transmission and where it is not feasible to cohort people (such as Systems analyst and detention facilities, homeless shelters, and cruise ships), CDC recommends a 10-day isolation period for residents. During periods of critical staffing shortages, facilities may consider shortening the isolation period for staff to ensure continuity of operations. Decisions to shorten isolation in these settings should be made in consultation with state, local, tribal, or territorial health departments and should take into consideration the context and characteristics of the facility.  CDC's setting-specific guidance provides additional recommendations for these settings. This CDC guidance is meant to supplement--not replace--any federal, state, local,territorial, or tribal health and safety laws, rules, and regulations. Recommendations for specific settings These recommendations do not apply to healthcare professionals. For guidance specific to these settings, see Healthcare professionals: Interim Guidance for Optician, dispensing with SARS-CoV-2 Infection or Exposure to SARS-CoV-2 Patients, residents, and visitors to healthcare settings: Interim Infection Prevention and Control Recommendations for Healthcare Personnel During the [place] 2019 (COVID-19) Pandemic Additional setting-specific guidance and recommendations are available. These recommendations on quarantine and isolation do apply to [facility] settings. Additional guidance is available here: Overview of COVID-19 Quarantine for K-12 Schools Travelers: Travel information and recommendations Congregate facilities and other settings: [employer] for community, work, and school settings Ongoing COVID-19 exposure FAQs I live with someone with COVID-19, but I cannot be separated from them. How do we manage quarantine in this situation? It is very important for people with COVID-19 to remain apart from other people, if possible, even if they are living together. If separation of the person with COVID-19 from others that they live with is not possible, the other people that they live with will have ongoing exposure, meaning they will be repeatedly exposed until that person is no longer able to spread the virus to other people. In this situation, there are precautions you can take to limit the spread of COVID-19: The person with COVID-19 and everyone they live with should wear a well-fitting mask inside the home. If possible, one person should care for the person with COVID-19 to limit the number of people  who are in close contact with the infected person. Take steps to protect yourself and others to reduce transmission in the home: Quarantine if you are not up to date with your COVID-19  vaccines. Isolate if you are sick or tested positive for COVID-19, even if you don't have symptoms. Learn more about the public health recommendations for testing, mask use and quarantine of close contacts, like yourself, who have ongoing exposure. These recommendations differ depending on your vaccination status. What should I do if I have ongoing exposure to COVID-19 from someone I live with? Recommendations for this situation depend on your vaccination status: If you are not up to date on COVID-19 vaccines and have ongoing exposure to COVID-19, you should: Begin quarantine immediately and continue to quarantine throughout the isolation period of the person with COVID-19. Continue to quarantine for an additional 5 days starting the day after the end of isolation for the person with COVID-19. Get tested at least 5 days after the end of isolation of the infected person that lives with them. If you test negative, you can leave the home but should continue to wear a well-fitting mask when around others at home and in public until 10 days after the end of isolation for the person with COVID-19. Isolate immediately if you develop symptoms of COVID-19 or test positive. If you are up to date with COVID-19 vaccines and have ongoing exposure to COVID-19, you should: Get tested at least 5 days after your first exposure. A person with COVID-19 is considered infectious starting 2 days before they develop symptoms, or 2 days before the date of their positive test if they do not have symptoms. Get tested again at least 5 days after the end of isolation for the person with COVID-19. Wear a well-fitting mask when you are around the person with COVID-19, and do this throughout their isolation period. Wear a well-fitting mask around  others for 10 days after the infected person's isolation period ends. Isolate immediately if you develop symptoms of COVID-19 or test positive. What should I do if multiple people I live with test positive for COVID-19 at different times? Recommendations for this situation depend on your vaccination status: If you are not up to date with your COVID-19 vaccines, you should: Quarantine throughout the isolation period of any infected person that you live with. Continue to quarantine until 5 days after the end of isolation date for the most recently infected person that lives with you. For example, if the last day of isolation of the person most recently infected with COVID-19 was June 30, the new 5-day quarantine period starts on July 1. Get tested at least 5 days after the end of isolation for the most recently infected person that lives with you. Wear a well-fitting mask when you are around any person with COVID-19 while that person is in isolation. Wear a well-fitting mask when you are around other people until 10 days after your last close contact. Isolate immediately if you develop symptoms of COVID-19 or test positive. If you are up to date with COVID-19 your vaccines , you should: Get tested at least 5 days after your first exposure. A person with COVID-19 is considered infectious starting 2 days before they developed symptoms, or 2 days before the date of their positive test if they do not have symptoms. Get tested again at least 5 days after the end of isolation for the most recently infected person that lives with you. Wear a well-fitting mask when you are around any person with COVID-19 while that person is in isolation. Wear a well-fitting mask around others for 10 days after the end of isolation for the most recently infected person that  lives with you. For example, if the last day of isolation for the person most recently infected with COVID-19 was June 30, the new 10-day period to wear a  well-fitting mask indoors in public starts on July 1. Isolate immediately if you develop symptoms of COVID-19 or test positive. I had COVID-19 and completed isolation. Do I have to quarantine or get tested if someone I live with gets COVID-19 shortly after I completed isolation? No. If you recently completed isolation and someone that lives with you tests positive for the virus that causes COVID-19 shortly after the end of your isolation period, you do not have to quarantine or get tested as long as you do not develop new symptoms. Once all of the people that live together have completed isolation or quarantine, refer to the guidance below for new exposures to COVID-19. If you had COVID-19 in the previous 90 days and then came into close contact with someone with COVID-19, you do not have to quarantine or get tested if you do not have symptoms. But you should: Wear a well-fitting mask indoors in public for 10 days after exposure. Monitor for COVID-19 symptoms and isolate immediately if symptoms develop. Consult with a healthcare provider for testing recommendations if new symptoms develop. If more than 90 days have passed since your recovery from infection, follow CDC's recommendations for close contacts. These recommendations will differ depending on your vaccination status. 02/29/2020 Content source: [facility] ([facility]), Division of Viral Diseases This information is not intended to replace advice given to you by your health care provider. Make sure you discuss any questions you have with your healthcare provider. Document Revised: 03/08/2020 Document Reviewed: 03/08/2020 Elsevier Patient Education  [place].

## 2020-07-19 NOTE — Progress Notes (Signed)
   Tina Watkins, [place]  [address] Phone:  [phone]   Fax:  [phone] Virtual Visit via Telephone Note  I connected with [name] on 07/19/20 at  3:30 PM EDT by telephone and verified that I am speaking with the correct person using two identifiers.   I discussed the limitations, risks, security and privacy concerns of performing an evaluation and management service by telephone and the availability of in person appointments. I also discussed with the patient that there may be a patient responsible charge related to this service. The patient expressed understanding and agreed to proceed.  Patient home Provider Office  History of Present Illness:  Tina Watkins  has a past medical history of Bacteremia due to Gram-negative bacteria (05/23/2011), Blind, Blind right eye, CHF (congestive heart failure) ([place]), Chronic lower back pain, Complication of anesthesia, DDD (degenerative disc disease), cervical, Depression, Dysrhythmia, E coli bacteremia (06/18/2011), Elevated LDL cholesterol level (12/2018), ESRD (end stage renal disease) ([place]) (06/12/2011), Fibromyalgia, Gastroparesis, Gastroparesis, GERD (gastroesophageal reflux disease), Glaucoma, Gout, H/O carpal tunnel syndrome, Headache (10/2019), Headache(784.0), Herpes genitalia (1994), History of blood transfusion, History of stomach ulcers, Hypotension, Iron deficiency anemia, New onset a-fib ([place]), Osteopenia, Peripheral neuropathy (11/2018), Pressure ulcer of sacral region, stage 1 (07/2019), Seizures ([place]) (1994), Spinal stenosis in cervical region, Stroke [facility]), Stroke ([place]) (~ 1999; 2001), and Vitamin D deficiency (12/2018).   She was recently diagnosed with COVID-19 pneumonia in the ED.  She reports some improvement but also was called and recommendation for her to start the monoclonal antibody therapy due to her chronic conditions.  She also would like to stop smoking.   She continues to have chronic fatigue and would like some assistance.     ROS   Observations/Objective: No exam; telephone visit Able to talk in full sentences without difficulty  Assessment and Plan: Assessment  Primary Diagnosis & Pertinent Problem List: The primary encounter diagnosis was COVID. Diagnoses of ESRD (end stage renal disease) on dialysis ([place]), Tobacco use, and Chronic fatigue were also pertinent to this visit.  Visit Diagnosis: 1. COVID  Recommendation from CDC due to chronic health conditions Paxlovid renal dosing continue with current treatment  2. ESRD (end stage renal disease) on dialysis ([place])   3. Tobacco use  Persistent NicoDerm patches 21 mg  4. Chronic fatigue  Recommended vitamin B12 and continue with vitamin D     Follow Up Instructions: 3 mons   I discussed the assessment and treatment plan with the patient. The patient was provided an opportunity to ask questions and all were answered. The patient agreed with the plan and demonstrated an understanding of the instructions.   The patient was advised to call back or seek an in-person evaluation if the symptoms worsen or if the condition fails to improve as anticipated.  I provided 14 minutes of telephone- visit time during this encounter.   Vevelyn Francois, NP

## 2020-07-24 ENCOUNTER — Telehealth: Payer: Self-pay

## 2020-07-24 NOTE — Telephone Encounter (Signed)
Vitamin D and covid medicine

## 2020-07-26 NOTE — Telephone Encounter (Signed)
Patient is needed PA, advised pharmacy will need to fax PA request.

## 2020-07-29 ENCOUNTER — Other Ambulatory Visit (HOSPITAL_COMMUNITY): Payer: Self-pay | Admitting: Family Medicine

## 2020-07-29 ENCOUNTER — Other Ambulatory Visit: Payer: Self-pay | Admitting: Nurse Practitioner

## 2020-07-31 ENCOUNTER — Telehealth (HOSPITAL_COMMUNITY): Payer: Self-pay | Admitting: *Deleted

## 2020-07-31 NOTE — Telephone Encounter (Signed)
Received fax from Diona Browner, DMD, pt needs clearance for 6 teeth extraction and to hold Eliquis  Per Dr Haroldine Laws: "cleared for surgery, ok to hold eliquis for 3 days"  Note faxed back to them at [phone]

## 2020-08-01 DIAGNOSIS — D689 Coagulation defect, unspecified: Secondary | ICD-10-CM | POA: Insufficient documentation

## 2020-08-16 ENCOUNTER — Encounter (HOSPITAL_COMMUNITY): Payer: Self-pay | Admitting: *Deleted

## 2020-09-04 ENCOUNTER — Other Ambulatory Visit: Payer: Self-pay

## 2020-09-04 ENCOUNTER — Encounter (HOSPITAL_COMMUNITY): Payer: Self-pay | Admitting: Gastroenterology

## 2020-09-10 ENCOUNTER — Encounter (HOSPITAL_COMMUNITY): Payer: Self-pay | Admitting: *Deleted

## 2020-09-11 NOTE — Anesthesia Preprocedure Evaluation (Addendum)
Anesthesia Evaluation  Patient identified by MRN, date of birth, ID band Patient awake    Reviewed: Allergy & Precautions, NPO status , Patient's Chart, lab work & pertinent test results  Airway Mallampati: II  TM Distance: >3 FB Neck ROM: Full    Dental no notable dental hx.    Pulmonary Current Smoker,    Pulmonary exam normal breath sounds clear to auscultation       Cardiovascular Normal cardiovascular exam+ dysrhythmias Atrial Fibrillation  Rhythm:Regular Rate:Normal     Neuro/Psych CVA negative psych ROS   GI/Hepatic negative GI ROS, Neg liver ROS,   Endo/Other  negative endocrine ROS  Renal/GU Renal diseaseS/P transplant  negative genitourinary   Musculoskeletal negative musculoskeletal ROS (+)   Abdominal   Peds negative pediatric ROS (+)  Hematology   Anesthesia Other Findings   Reproductive/Obstetrics negative OB ROS                            Anesthesia Physical Anesthesia Plan  ASA: 3  Anesthesia Plan: MAC   Post-op Pain Management:    Induction: Intravenous  PONV Risk Score and Plan: 2 and Propofol infusion  Airway Management Planned: Simple Face Mask  Additional Equipment:   Intra-op Plan:   Post-operative Plan:   Informed Consent: I have reviewed the patients History and Physical, chart, labs and discussed the procedure including the risks, benefits and alternatives for the proposed anesthesia with the patient or authorized representative who has indicated his/her understanding and acceptance.     Dental advisory given  Plan Discussed with: CRNA and Surgeon  Anesthesia Plan Comments:         Anesthesia Quick Evaluation

## 2020-09-12 ENCOUNTER — Ambulatory Visit (HOSPITAL_COMMUNITY): Payer: Medicare Other | Admitting: Anesthesiology

## 2020-09-12 ENCOUNTER — Encounter (HOSPITAL_COMMUNITY): Payer: Self-pay | Admitting: Gastroenterology

## 2020-09-12 ENCOUNTER — Encounter (HOSPITAL_COMMUNITY)
Admission: RE | Disposition: A | Discharge status: Home / Self Care | Payer: Self-pay | Source: Home / Self Care | Attending: Gastroenterology

## 2020-09-12 ENCOUNTER — Other Ambulatory Visit: Payer: Self-pay

## 2020-09-12 ENCOUNTER — Ambulatory Visit (HOSPITAL_COMMUNITY)
Admission: RE | Admit: 2020-09-12 | Discharge: 2020-09-12 | Disposition: A | Discharge status: Home / Self Care | Payer: Medicare Other | Attending: Gastroenterology | Admitting: Gastroenterology

## 2020-09-12 DIAGNOSIS — I132 Hypertensive heart and chronic kidney disease with heart failure and with stage 5 chronic kidney disease, or end stage renal disease: Secondary | ICD-10-CM | POA: Diagnosis not present

## 2020-09-12 DIAGNOSIS — Z992 Dependence on renal dialysis: Secondary | ICD-10-CM | POA: Insufficient documentation

## 2020-09-12 DIAGNOSIS — Z1211 Encounter for screening for malignant neoplasm of colon: Secondary | ICD-10-CM | POA: Insufficient documentation

## 2020-09-12 DIAGNOSIS — K648 Other hemorrhoids: Secondary | ICD-10-CM | POA: Insufficient documentation

## 2020-09-12 DIAGNOSIS — K319 Disease of stomach and duodenum, unspecified: Secondary | ICD-10-CM | POA: Diagnosis not present

## 2020-09-12 DIAGNOSIS — R1900 Intra-abdominal and pelvic swelling, mass and lump, unspecified site: Secondary | ICD-10-CM

## 2020-09-12 DIAGNOSIS — K635 Polyp of colon: Secondary | ICD-10-CM

## 2020-09-12 DIAGNOSIS — F17210 Nicotine dependence, cigarettes, uncomplicated: Secondary | ICD-10-CM | POA: Insufficient documentation

## 2020-09-12 DIAGNOSIS — Z83511 Family history of glaucoma: Secondary | ICD-10-CM | POA: Insufficient documentation

## 2020-09-12 DIAGNOSIS — K219 Gastro-esophageal reflux disease without esophagitis: Secondary | ICD-10-CM | POA: Diagnosis not present

## 2020-09-12 DIAGNOSIS — Z8249 Family history of ischemic heart disease and other diseases of the circulatory system: Secondary | ICD-10-CM | POA: Diagnosis not present

## 2020-09-12 DIAGNOSIS — Z91048 Other nonmedicinal substance allergy status: Secondary | ICD-10-CM | POA: Insufficient documentation

## 2020-09-12 DIAGNOSIS — Z888 Allergy status to other drugs, medicaments and biological substances status: Secondary | ICD-10-CM | POA: Insufficient documentation

## 2020-09-12 DIAGNOSIS — Z820 Family history of epilepsy and other diseases of the nervous system: Secondary | ICD-10-CM | POA: Insufficient documentation

## 2020-09-12 DIAGNOSIS — D122 Benign neoplasm of ascending colon: Secondary | ICD-10-CM | POA: Diagnosis not present

## 2020-09-12 DIAGNOSIS — Z905 Acquired absence of kidney: Secondary | ICD-10-CM | POA: Diagnosis not present

## 2020-09-12 DIAGNOSIS — R1013 Epigastric pain: Secondary | ICD-10-CM

## 2020-09-12 DIAGNOSIS — K2970 Gastritis, unspecified, without bleeding: Secondary | ICD-10-CM | POA: Diagnosis not present

## 2020-09-12 DIAGNOSIS — R112 Nausea with vomiting, unspecified: Secondary | ICD-10-CM

## 2020-09-12 DIAGNOSIS — Z940 Kidney transplant status: Secondary | ICD-10-CM | POA: Diagnosis not present

## 2020-09-12 DIAGNOSIS — K449 Diaphragmatic hernia without obstruction or gangrene: Secondary | ICD-10-CM | POA: Insufficient documentation

## 2020-09-12 DIAGNOSIS — N186 End stage renal disease: Secondary | ICD-10-CM | POA: Insufficient documentation

## 2020-09-12 DIAGNOSIS — R198 Other specified symptoms and signs involving the digestive system and abdomen: Secondary | ICD-10-CM

## 2020-09-12 DIAGNOSIS — Z86010 Personal history of colonic polyps: Secondary | ICD-10-CM | POA: Diagnosis not present

## 2020-09-12 DIAGNOSIS — I509 Heart failure, unspecified: Secondary | ICD-10-CM | POA: Insufficient documentation

## 2020-09-12 HISTORY — PX: ESOPHAGOGASTRODUODENOSCOPY (EGD) WITH PROPOFOL: SHX5813

## 2020-09-12 HISTORY — PX: COLONOSCOPY WITH PROPOFOL: SHX5780

## 2020-09-12 HISTORY — PX: POLYPECTOMY: SHX5525

## 2020-09-12 HISTORY — PX: BIOPSY: SHX5522

## 2020-09-12 LAB — POCT I-STAT, CHEM 8
BUN: 30 mg/dL — ABNORMAL HIGH (ref 6–20)
Calcium, Ion: 1.12 mmol/L — ABNORMAL LOW (ref 1.15–1.40)
Chloride: 100 mmol/L (ref 98–111)
Creatinine, Ser: 6 mg/dL — ABNORMAL HIGH (ref 0.44–1.00)
Glucose, Bld: 75 mg/dL (ref 70–99)
HCT: 25 % — ABNORMAL LOW (ref 36.0–46.0)
Hemoglobin: 8.5 g/dL — ABNORMAL LOW (ref 12.0–15.0)
Potassium: 3.7 mmol/L (ref 3.5–5.1)
Sodium: 134 mmol/L — ABNORMAL LOW (ref 135–145)
TCO2: 26 mmol/L (ref 22–32)

## 2020-09-12 SURGERY — COLONOSCOPY WITH PROPOFOL
Anesthesia: Monitor Anesthesia Care

## 2020-09-12 MED ORDER — PROPOFOL 1000 MG/100ML IV EMUL
INTRAVENOUS | Status: AC
  Filled 2020-09-12: qty 100

## 2020-09-12 MED ORDER — SODIUM CHLORIDE 0.9 % IV SOLN: INTRAVENOUS | Status: DC

## 2020-09-12 MED ORDER — PROPOFOL 10 MG/ML IV BOLUS
INTRAVENOUS | Status: DC | PRN
  Administered 2020-09-12: 40 mg via INTRAVENOUS
  Administered 2020-09-12: 20 mg via INTRAVENOUS

## 2020-09-12 MED ORDER — PHENYLEPHRINE 40 MCG/ML (10ML) SYRINGE FOR IV PUSH (FOR BLOOD PRESSURE SUPPORT)
PREFILLED_SYRINGE | INTRAVENOUS | Status: DC | PRN
  Administered 2020-09-12: 80 ug via INTRAVENOUS

## 2020-09-12 MED ORDER — PROPOFOL 500 MG/50ML IV EMUL
INTRAVENOUS | Status: AC
  Filled 2020-09-12: qty 50

## 2020-09-12 MED ORDER — PROPOFOL 500 MG/50ML IV EMUL
INTRAVENOUS | Status: DC | PRN
  Administered 2020-09-12: 150 ug/kg/min via INTRAVENOUS

## 2020-09-12 MED ORDER — LIDOCAINE 2% (20 MG/ML) 5 ML SYRINGE
INTRAMUSCULAR | Status: DC | PRN
  Administered 2020-09-12: 40 mg via INTRAVENOUS

## 2020-09-12 SURGICAL SUPPLY — 24 items
BLOCK BITE 60FR ADLT L/F BLUE (MISCELLANEOUS) ×3 IMPLANT
ELECT REM PT RETURN 9FT ADLT (ELECTROSURGICAL) IMPLANT
ELECTRODE REM PT RTRN 9FT ADLT (ELECTROSURGICAL) IMPLANT
FCP BXJMBJMB 240X2.8X (CUTTING FORCEPS)
FLOOR PAD 36X40 (MISCELLANEOUS) ×3 IMPLANT
FORCEP RJ3 GP 1.8X160 W-NEEDLE (CUTTING FORCEPS) IMPLANT
FORCEPS BIOP RAD 4 LRG CAP 4 (CUTTING FORCEPS) IMPLANT
FORCEPS BIOP RJ4 240 W/NDL (CUTTING FORCEPS) IMPLANT
FORCEPS BXJMBJMB 240X2.8X (CUTTING FORCEPS) IMPLANT
INJECTOR/SNARE I SNARE (MISCELLANEOUS) IMPLANT
LUBRICANT JELLY 4.5OZ STERILE (MISCELLANEOUS) IMPLANT
MANIFOLD NEPTUNE II (INSTRUMENTS) IMPLANT
NEEDLE SCLEROTHERAPY 25GX240 (NEEDLE) IMPLANT
PAD FLOOR 36X40 (MISCELLANEOUS) ×2 IMPLANT
PROBE APC STR FIRE (PROBE) IMPLANT
PROBE INJECTION GOLD (MISCELLANEOUS)
PROBE INJECTION GOLD 7FR (MISCELLANEOUS) IMPLANT
SNARE ROTATE MED OVAL 20MM (MISCELLANEOUS) IMPLANT
SNARE SHORT THROW 13M SML OVAL (MISCELLANEOUS) IMPLANT
SYR 50ML LL SCALE MARK (SYRINGE) IMPLANT
TRAP SPECIMEN MUCOUS 40CC (MISCELLANEOUS) IMPLANT
TUBING ENDO SMARTCAP PENTAX (MISCELLANEOUS) ×6 IMPLANT
TUBING IRRIGATION ENDOGATOR (MISCELLANEOUS) ×3 IMPLANT
WATER STERILE IRR 1000ML POUR (IV SOLUTION) IMPLANT

## 2020-09-12 NOTE — H&P (Signed)
HPI: This is a woman with h/o colon polyps, abd pain   ROS: complete GI ROS as described in HPI, all other review negative.  Constitutional:  No unintentional weight loss   Past Medical History:  Diagnosis Date   Bacteremia due to Gram-negative bacteria 05/23/2011   Blind    right eye   Blind right eye    CHF (congestive heart failure) (HCC)    Chronic lower back pain    Complication of anesthesia    "woke up during OR; I have an extremely high tolerance" (12/11/2011) 1 procedure was graft; the other procedures were procedures that are typically done with sedation.   DDD (degenerative disc disease), cervical    Depression    Dysrhythmia    "tachycardia" (12/11/2011) new onset afib 10/15/14 EKG   E coli bacteremia 06/18/2011   Elevated LDL cholesterol level 12/2018   ESRD (end stage renal disease) ([place]) 06/12/2011   Fibromyalgia    Gastroparesis    Gastroparesis    GERD (gastroesophageal reflux disease)    Glaucoma    right eye   Gout    H/O carpal tunnel syndrome    Headache 10/2019   Headache(784.0)    "not often anymore" (12/11/2011)   Herpes genitalia 1994   History of blood transfusion    "more than a few times" (12/11/2011)   History of stomach ulcers    Hypotension    Iron deficiency anemia    New onset a-fib ([place])    10/15/14 EKG   Osteopenia    Peripheral neuropathy 11/2018   Pressure ulcer of sacral region, stage 1 07/2019   Seizures ([place]) 1994   "post transplant; only have had that one" (12/11/2011)   Spinal stenosis in cervical region    Stroke [facility])     left basal ganglia lacunar infarct; Right frontal lobe lacunar infarct.   Stroke [facility]) ~ 1999; 2001   "briefly lost my vision; lost my right eye" (12/11/2011)   Vitamin D deficiency 12/2018    Past Surgical History:  Procedure Laterality Date   ANTERIOR CERVICAL DECOMP/DISCECTOMY FUSION N/A 01/08/2015   Procedure: Anterior Cervical Three-Four/Four-Five Decompression/Diskectomy/Fusion;  Surgeon: Leeroy Cha, MD;  Location: [facility];  Service: Neurosurgery;  Laterality: N/A;  C3-4 C4-5 Anterior cervical decompression/diskectomy/fusion   APPENDECTOMY  ~ 2004   CATARACT EXTRACTION     right eye   COLONOSCOPY     ENUCLEATION  2001   "right"   ESOPHAGOGASTRODUODENOSCOPY (EGD) WITH PROPOFOL N/A 04/21/2012   Procedure: ESOPHAGOGASTRODUODENOSCOPY (EGD) WITH PROPOFOL;  Surgeon: Milus Banister, MD;  Location: [facility] ENDOSCOPY;  Service: Endoscopy;  Laterality: N/A;   INSERTION OF DIALYSIS CATHETER  1988   "AV graft LUA & LFA; LUA worked for 1 day; LFA never worked"   IR FLUORO GUIDE CV LINE RIGHT  07/11/2019   IR FLUORO GUIDE CV LINE RIGHT  10/20/2019   IR FLUORO GUIDE CV LINE RIGHT  05/31/2020   IR PTA VENOUS EXCEPT DIALYSIS CIRCUIT  05/31/2020   IR RADIOLOGY PERIPHERAL GUIDED IV START  07/11/2019   IR US GUIDE VASC ACCESS RIGHT  07/11/2019   IR US GUIDE VASC ACCESS RIGHT  07/11/2019   IR US GUIDE VASC ACCESS RIGHT  07/11/2019   IR VENOCAVAGRAM IVC  05/31/2020   KIDNEY TRANSPLANT  1994; 1999; 2005   "right"   MULTIPLE TOOTH EXTRACTIONS     RIGHT HEART CATH N/A 03/23/2019   Procedure: RIGHT HEART CATH;  Surgeon: Jolaine Artist, MD;  Location: [facility]  INVASIVE CV LAB;  Service: Cardiovascular;  Laterality: N/A;   [place]?; 1994; 2005    Current Facility-Administered Medications  Medication Dose Route Frequency Provider Last Rate Last Admin   0.9 %  sodium chloride infusion   Intravenous Continuous Milus Banister, MD        Allergies as of 07/10/2020 - Review Complete 06/12/2020  Allergen Reaction Noted   Levofloxacin Itching and Rash 05/23/2011   Tape Other (See Comments) 03/20/2019    Family History  Problem Relation Age of Onset   Glaucoma Mother    Pancreatic cancer Father    Multiple sclerosis Brother    Hypertension Maternal Grandmother    Breast cancer Neg Hx     Social History   Socioeconomic History   Marital status: Single    Spouse name:  Not on file   Number of children: 0   Years of education: some college   Highest education level: Some college, no degree  Occupational History   Occupation: disabled    Fish farm manager: UNEMPLOYED  Tobacco Use   Smoking status: Every Day    Packs/day: 0.33    Years: 35.00    Pack years: 11.55    Types: Cigarettes   Smokeless tobacco: Never   Tobacco comments:    half pack daily  Vaping Use   Vaping Use: Never used  Substance and Sexual Activity   Alcohol use: Yes    Alcohol/week: 1.0 standard drink    Types: 1 Standard drinks or equivalent per week    Comment: rare if out to dinner   Drug use: Yes    Frequency: 1.0 times per week    Types: Marijuana    Comment: smoking once a day when in severe pain   Sexual activity: Yes    Partners: Male    Birth control/protection: None  Other Topics Concern   Not on file  Social History Narrative   Right-handed.   Four cups caffeine per day.   Lives alone.   Social Determinants of Health   Financial Resource Strain: Low Risk    Difficulty of Paying Living Expenses: Not hard at all  Food Insecurity: No Food Insecurity   Worried About Charity fundraiser in the Last Year: Never true   [place] in the Last Year: Never true  Transportation Needs: No Transportation Needs   Lack of Transportation (Medical): No   Lack of Transportation (Non-Medical): No  Physical Activity: Insufficiently Active   Days of Exercise per Week: 3 days   Minutes of Exercise per Session: 10 min  Stress: Stress Concern Present   Feeling of Stress : To some extent  Social Connections: Moderately Isolated   Frequency of Communication with Friends and Family: More than three times a week   Frequency of Social Gatherings with Friends and Family: More than three times a week   Attends Religious Services: More than 4 times per year   Active Member of [employer] or Organizations: No   Attends Music therapist: Never   Marital Status: Never married   Human resources officer Violence: Not At Risk   Fear of Current or Ex-Partner: No   Emotionally Abused: No   Physically Abused: No   Sexually Abused: No     Physical Exam: Ht 4\' 11"  (1.499 m)   Wt 48.1 kg   BMI 21.41 kg/m  Constitutional: generally well-appearing Psychiatric: alert and oriented x3 Abdomen: soft, nontender, nondistended, no obvious  ascites, no peritoneal signs, normal bowel sounds No peripheral edema noted in lower extremities  Assessment and plan: 55 y.o. female with h/o colon polyps, abd pains  For colonoscopy and egd today  Please see the "Patient Instructions" section for addition details about the plan.  Owens Loffler, MD [facility] 09/12/2020, 7:14 AM

## 2020-09-12 NOTE — Op Note (Signed)
[facility] Patient Name: Tina Watkins Procedure Date: 09/12/2020 MRN: [id number] Attending MD: Milus Banister , MD Date of Birth: February 01, 1966 CSN: [id number] Age: 55 Admit Type: Inpatient Procedure:                Upper GI endoscopy Indications:              chronic (years), intermittent nausea with vomiting Providers:                Milus Banister, MD, Kary Kos, RN, Cletis Athens, Technician Referring MD:              Medicines:                 Complications:            No immediate complications. No immediate                            complications. Estimated blood loss: None. Estimated Blood Loss:     Estimated blood loss: none. Procedure:                Pre-Anesthesia Assessment:                           - Prior to the procedure, a History and Physical                            was performed, and patient medications and                            allergies were reviewed. The patient's tolerance of                            previous anesthesia was also reviewed. The risks                            and benefits of the procedure and the sedation                            options and risks were discussed with the patient.                            All questions were answered, and informed consent                            was obtained. Prior Anticoagulants: The patient has                            taken Eliquis (apixaban), last dose was 5 days                            prior to procedure. ASA Grade Assessment: III - A  patient with severe systemic disease. After                            reviewing the risks and benefits, the patient was                            deemed in satisfactory condition to undergo the                            procedure.                           After obtaining informed consent, the endoscope was                            passed under direct vision. Throughout the                             procedure, the patient's blood pressure, pulse, and                            oxygen saturations were monitored continuously. The                            GIF-H190 ([id number]) Olympus endoscope was introduced                            through the mouth, and advanced to the second part                            of duodenum. The upper GI endoscopy was                            accomplished without difficulty. The patient                            tolerated the procedure well. The GIF-H190                            ([id number]) Olympus endoscope was introduced through                            the and advanced to the. Scope In: Scope Out: Findings:      The esophagus was normal.      Small hiatal hernia with minor Cameron's type erosions present.      Mild inflammation characterized by erythema, friability and granularity       was found in the gastric antrum. Biopsies were taken with a cold forceps       for histology.      The examined duodenum was normal.      The exam was otherwise without abnormality. Impression:               - Small hiatal hernia and associated Cameron's  erosions.                           - Mild, non-specific gastritis. Biopsies taken to                            check for H. pylori                           - The examination was otherwise normal. Moderate Sedation:      Not Applicable - Patient had care per Anesthesia. Recommendation:           - Patient has a contact number available for                            emergencies. The signs and symptoms of potential                            delayed complications were discussed with the                            patient. Return to normal activities tomorrow.                            Written discharge instructions were provided to the                            patient.                           - Resume previous diet.                           - Continue  present medications.                           - Await pathology results. Procedure Code(s):        --- Professional ---                           [phone], Esophagogastroduodenoscopy, flexible,                            transoral; with biopsy, single or multiple Diagnosis Code(s):        --- Professional ---                           K29.70, Gastritis, unspecified, without bleeding                           R11.2, Nausea with vomiting, unspecified CPT copyright 2019 American Medical Association. All rights reserved. The codes documented in this report are preliminary and upon coder review may  be revised to meet current compliance requirements. Milus Banister, MD 09/12/2020 8:07:54 AM This report has been signed electronically. Number of Addenda: 0

## 2020-09-12 NOTE — Discharge Instructions (Signed)
YOU HAD AN ENDOSCOPIC PROCEDURE TODAY: Refer to the procedure report and other information in the discharge instructions given to you for any specific questions about what was found during the examination. If this information does not answer your questions, please call [place] office at [phone] to clarify.  ° °YOU SHOULD EXPECT: Some feelings of bloating in the abdomen. Passage of more gas than usual. Walking can help get rid of the air that was put into your GI tract during the procedure and reduce the bloating. If you had a lower endoscopy (such as a colonoscopy or flexible sigmoidoscopy) you may notice spotting of blood in your stool or on the toilet paper. Some abdominal soreness may be present for a day or two, also. ° °DIET: Your first meal following the procedure should be a light meal and then it is ok to progress to your normal diet. A half-sandwich or bowl of soup is an example of a good first meal. Heavy or fried foods are harder to digest and may make you feel nauseous or bloated. Drink plenty of fluids but you should avoid alcoholic beverages for 24 hours. If you had a esophageal dilation, please see attached instructions for diet.   ° °ACTIVITY: Your care partner should take you home directly after the procedure. You should plan to take it easy, moving slowly for the rest of the day. You can resume normal activity the day after the procedure however YOU SHOULD NOT DRIVE, use power tools, machinery or perform tasks that involve climbing or major physical exertion for 24 hours (because of the sedation medicines used during the test).  ° °SYMPTOMS TO REPORT IMMEDIATELY: °A gastroenterologist can be reached at any hour. Please call [phone]  for any of the following symptoms:  °Following lower endoscopy (colonoscopy, flexible sigmoidoscopy) °Excessive amounts of blood in the stool  °Significant tenderness, worsening of abdominal pains  °Swelling of the abdomen that is new, acute  °Fever of 100° or  higher  °Following upper endoscopy (EGD, EUS, ERCP, esophageal dilation) °Vomiting of blood or coffee ground material  °New, significant abdominal pain  °New, significant chest pain or pain under the shoulder blades  °Painful or persistently difficult swallowing  °New shortness of breath  °Black, tarry-looking or red, bloody stools ° °FOLLOW UP:  °If any biopsies were taken you will be contacted by phone or by letter within the next 1-3 weeks. Call [phone]  if you have not heard about the biopsies in 3 weeks.  °Please also call with any specific questions about appointments or follow up tests. ° °

## 2020-09-12 NOTE — Anesthesia Postprocedure Evaluation (Signed)
Anesthesia Post Note  Patient: Tina Watkins  Procedure(s) Performed: COLONOSCOPY WITH PROPOFOL ESOPHAGOGASTRODUODENOSCOPY (EGD) WITH PROPOFOL POLYPECTOMY BIOPSY     Patient location during evaluation: PACU Anesthesia Type: MAC Level of consciousness: awake and alert Pain management: pain level controlled Vital Signs Assessment: post-procedure vital signs reviewed and stable Respiratory status: spontaneous breathing, nonlabored ventilation, respiratory function stable and patient connected to nasal cannula oxygen Cardiovascular status: stable and blood pressure returned to baseline Postop Assessment: no apparent nausea or vomiting Anesthetic complications: no   No notable events documented.  Last Vitals:  Vitals:   09/12/20 0810 09/12/20 0820  BP: (!) 99/57 132/87  Pulse:  76  Resp: 16 19  Temp:    SpO2: 94%     Last Pain:  Vitals:   09/12/20 0820  TempSrc:   PainSc: 0-No pain                 Alp [place] S

## 2020-09-12 NOTE — Op Note (Signed)
[facility] Patient Name: Tina Watkins Procedure Date: 09/12/2020 MRN: [id number] Attending MD: Milus Banister , MD Date of Birth: 02-09-1965 CSN: [id number] Age: 55 Admit Type: Inpatient Procedure:                Colonoscopy Indications:              High risk colon cancer surveillance: Personal                            history of colonic polyps; colonoscopy by Dr. Delrae Alfred                            at [facility] 10/13/2012 which identified one 39mm tubular                            adenomatous polyp which was removed from the                            splenic flexure. Providers:                Milus Banister, MD, Kary Kos RN, RN, Theodora Blow, Technician Referring MD:              Medicines:                Monitored Anesthesia Care Complications:            No immediate complications. Estimated blood loss:                            None. Estimated Blood Loss:     Estimated blood loss: none. Procedure:                Pre-Anesthesia Assessment:                           - Prior to the procedure, a History and Physical                            was performed, and patient medications and                            allergies were reviewed. The patient's tolerance of                            previous anesthesia was also reviewed. The risks                            and benefits of the procedure and the sedation                            options and risks were discussed with the patient.                            All questions were  answered, and informed consent                            was obtained. Prior Anticoagulants: The patient has                            taken Eliquis (apixaban), last dose was 5 days                            prior to procedure. ASA Grade Assessment: IV - A                            patient with severe systemic disease that is a                            constant threat to life. After reviewing the risks                             and benefits, the patient was deemed in                            satisfactory condition to undergo the procedure.                           After obtaining informed consent, the colonoscope                            was passed under direct vision. Throughout the                            procedure, the patient's blood pressure, pulse, and                            oxygen saturations were monitored continuously. The                            CF-HQ190L ([id number]) Olympus colonoscope was                            introduced through the anus and advanced to the the                            cecum, identified by appendiceal orifice and                            ileocecal valve. The colonoscopy was performed                            without difficulty. The patient tolerated the                            procedure well. The quality of the bowel  preparation was adequate. The ileocecal valve,                            appendiceal orifice, and rectum were photographed. Scope In: 7:39:31 AM Scope Out: 7:51:39 AM Scope Withdrawal Time: 0 hours 9 minutes 28 seconds  Total Procedure Duration: 0 hours 12 minutes 8 seconds  Findings:      A 3 mm polyp was found in the ascending colon. The polyp was sessile.       The polyp was removed with a cold snare. Resection and retrieval were       complete.      Mediums sized internal hemorrhoids which started to bleed after       retroflex view and stopped without intervention.      The exam was otherwise without abnormality on direct and retroflexion       views. Impression:               - One 3 mm polyp in the ascending colon, removed                            with a cold snare. Resected and retrieved.                           - Internal hemorrhoids which started to bleed after                            retroflex view and stopped without intervention.                           - The  examination was otherwise normal on direct                            and retroflexion views. Moderate Sedation:      Not Applicable - Patient had care per Anesthesia. Recommendation:           - Await pathology results.                           - EGD now. Procedure Code(s):        --- Professional ---                           [phone], Colonoscopy, flexible; with removal of                            tumor(s), polyp(s), or other lesion(s) by snare                            technique Diagnosis Code(s):        --- Professional ---                           Z86.010, Personal history of colonic polyps                           K63.5, Polyp of colon  K64.8, Other hemorrhoids CPT copyright 2019 American Medical Association. All rights reserved. The codes documented in this report are preliminary and upon coder review may  be revised to meet current compliance requirements. Milus Banister, MD 09/12/2020 7:56:17 AM This report has been signed electronically. Number of Addenda: 0

## 2020-09-12 NOTE — Anesthesia Procedure Notes (Signed)
Procedure Name: MAC Date/Time: 09/12/2020 7:30 AM Performed by: Talbot Grumbling, CRNA Pre-anesthesia Checklist: Patient identified, Emergency Drugs available, Suction available and Patient being monitored Oxygen Delivery Method: Simple face mask

## 2020-09-12 NOTE — Transfer of Care (Signed)
Immediate Anesthesia Transfer of Care Note  Patient: Tina Watkins  Procedure(s) Performed: COLONOSCOPY WITH PROPOFOL ESOPHAGOGASTRODUODENOSCOPY (EGD) WITH PROPOFOL POLYPECTOMY BIOPSY  Patient Location: PACU  Anesthesia Type:MAC  Level of Consciousness: sedated  Airway & Oxygen Therapy: Patient Spontanous Breathing and Patient connected to face mask oxygen  Post-op Assessment: Report given to RN and Post -op Vital signs reviewed and stable  Post vital signs: Reviewed and stable  Last Vitals:  Vitals Value Taken Time  BP    Temp    Pulse    Resp 18 09/12/20 0809  SpO2    Vitals shown include unvalidated device data.  Last Pain:  Vitals:   09/12/20 0714  TempSrc: Oral  PainSc: 0-No pain         Complications: No notable events documented.

## 2020-09-13 ENCOUNTER — Encounter (HOSPITAL_COMMUNITY): Payer: Self-pay | Admitting: Gastroenterology

## 2020-09-13 LAB — SURGICAL PATHOLOGY

## 2020-09-15 ENCOUNTER — Other Ambulatory Visit: Payer: Self-pay | Admitting: Gastroenterology

## 2020-09-18 ENCOUNTER — Ambulatory Visit (HOSPITAL_COMMUNITY)
Admission: RE | Admit: 2020-09-18 | Discharge: 2020-09-18 | Disposition: A | Discharge status: Home / Self Care | Payer: Medicare Other | Source: Ambulatory Visit | Attending: Nurse Practitioner | Admitting: Nurse Practitioner

## 2020-09-18 ENCOUNTER — Encounter (HOSPITAL_COMMUNITY): Payer: Self-pay | Admitting: Nurse Practitioner

## 2020-09-18 ENCOUNTER — Other Ambulatory Visit: Payer: Self-pay

## 2020-09-18 VITALS — BP 94/68 | HR 62 | Ht 59.0 in | Wt 114.2 lb

## 2020-09-18 DIAGNOSIS — Z992 Dependence on renal dialysis: Secondary | ICD-10-CM | POA: Diagnosis not present

## 2020-09-18 DIAGNOSIS — Z7952 Long term (current) use of systemic steroids: Secondary | ICD-10-CM | POA: Insufficient documentation

## 2020-09-18 DIAGNOSIS — Z940 Kidney transplant status: Secondary | ICD-10-CM | POA: Insufficient documentation

## 2020-09-18 DIAGNOSIS — I132 Hypertensive heart and chronic kidney disease with heart failure and with stage 5 chronic kidney disease, or end stage renal disease: Secondary | ICD-10-CM | POA: Diagnosis not present

## 2020-09-18 DIAGNOSIS — R9431 Abnormal electrocardiogram [ECG] [EKG]: Secondary | ICD-10-CM | POA: Insufficient documentation

## 2020-09-18 DIAGNOSIS — I959 Hypotension, unspecified: Secondary | ICD-10-CM | POA: Insufficient documentation

## 2020-09-18 DIAGNOSIS — I4892 Unspecified atrial flutter: Secondary | ICD-10-CM | POA: Insufficient documentation

## 2020-09-18 DIAGNOSIS — F17210 Nicotine dependence, cigarettes, uncomplicated: Secondary | ICD-10-CM | POA: Insufficient documentation

## 2020-09-18 DIAGNOSIS — D6869 Other thrombophilia: Secondary | ICD-10-CM

## 2020-09-18 DIAGNOSIS — Z79899 Other long term (current) drug therapy: Secondary | ICD-10-CM | POA: Insufficient documentation

## 2020-09-18 DIAGNOSIS — I5032 Chronic diastolic (congestive) heart failure: Secondary | ICD-10-CM | POA: Insufficient documentation

## 2020-09-18 DIAGNOSIS — F1290 Cannabis use, unspecified, uncomplicated: Secondary | ICD-10-CM | POA: Insufficient documentation

## 2020-09-18 DIAGNOSIS — N186 End stage renal disease: Secondary | ICD-10-CM | POA: Insufficient documentation

## 2020-09-18 DIAGNOSIS — Z7901 Long term (current) use of anticoagulants: Secondary | ICD-10-CM | POA: Diagnosis not present

## 2020-09-18 DIAGNOSIS — Z8616 Personal history of COVID-19: Secondary | ICD-10-CM | POA: Diagnosis not present

## 2020-09-18 DIAGNOSIS — Z981 Arthrodesis status: Secondary | ICD-10-CM | POA: Diagnosis not present

## 2020-09-18 LAB — COMPREHENSIVE METABOLIC PANEL
ALT: 26 U/L (ref 0–44)
AST: 34 U/L (ref 15–41)
Albumin: 3.8 g/dL (ref 3.5–5.0)
Alkaline Phosphatase: 135 U/L — ABNORMAL HIGH (ref 38–126)
Anion gap: 13 (ref 5–15)
BUN: 27 mg/dL — ABNORMAL HIGH (ref 6–20)
CO2: 25 mmol/L (ref 22–32)
Calcium: 9.3 mg/dL (ref 8.9–10.3)
Chloride: 96 mmol/L — ABNORMAL LOW (ref 98–111)
Creatinine, Ser: 5.99 mg/dL — ABNORMAL HIGH (ref 0.44–1.00)
GFR, Estimated: 8 mL/min — ABNORMAL LOW (ref >60)
Glucose, Bld: 110 mg/dL — ABNORMAL HIGH (ref 70–99)
Potassium: 3.7 mmol/L (ref 3.5–5.1)
Sodium: 134 mmol/L — ABNORMAL LOW (ref 135–145)
Total Bilirubin: 0.8 mg/dL (ref 0.3–1.2)
Total Protein: 6.9 g/dL (ref 6.5–8.1)

## 2020-09-18 LAB — T4, FREE: Free T4: 0.81 ng/dL (ref 0.61–1.12)

## 2020-09-18 LAB — TSH: TSH: 4.632 u[IU]/mL — ABNORMAL HIGH (ref 0.350–4.500)

## 2020-09-18 NOTE — Progress Notes (Addendum)
Primary Care Physician: Vevelyn Francois, NP Referring Physician: AHF AHF MD: Dr. Ciro Backer Tina Watkins is a 55 y.o. female with a h/o ESRD s/p failed renal transplant now on HD TTS, orthostatic hypotension, chronic diastolic CHF, moderate pHTN, HTN and gout brought to ED from outpatient HD with rapid A.flutter and fever.  Reportedly had fever with heart rates in 150s after 1 hour of HD session.  She had 20 mg IV Cardizem push.  RVR resolved but she became hypotensive.  In ED, febrile to 100.5.  No leukocytosis.  UA, CXR and CT abdomen and pelvis without significant finding.  She was hypotensive but blood pressure improved with 500 cc bolus.  She was in sinus rhythm.  Blood cultures obtained.  She was a started on broad-spectrum antibiotics for SIRS.  The next day, blood pressure improved.  Remained in sinus rhythm.  Blood cultures NGTD. TTE without significant finding.  Cleared by nephrology for discharge.  She felt well and asked to go home.  Nephrology to follow-up on further update of her blood culture and address accordingly.  She was seen in AHF clinc, 04/03/20. It was noted that pt's monitor showed less thatn 1% aflutter  burden and was referred here to see if antiarrythmic's were indicated. Ekg today shows NSR. Pt denies having any tachy arrhythmia's. She has not started eliquis as she was waiting  to have 5 teeth extractions end of April. She has a CHA2DS2VASc score of 6 with a previous stroke. She states that when she had Covid last June, that it caused her to lose her kidney transplant that she had for 16 years done in the [place] area. Hence she had to start back on dialysis. She was started on amiodarone 04/23/20 for rapid a flutter occurring with dialysis, dose ws reduced to 100 mg daily at a later date, for mild elevation of TSH.  F/u in afib clinic, 09/18/20. She had Covid  in June with Pneumonia, She states that she recovered quickly.  EKG today shows  SR. SHe has noted  very little rapid heart rate . Will usually occur at the tail end of dialysis, of very short duration.   Today, she denies symptoms of palpitations, chest pain, shortness of breath, orthopnea, PND, lower extremity edema, dizziness, presyncope, syncope, or neurologic sequela. The patient is tolerating medications without difficulties and is otherwise without complaint today.   Past Medical History:  Diagnosis Date   Bacteremia due to Gram-negative bacteria 05/23/2011   Blind    right eye   Blind right eye    CHF (congestive heart failure) (HCC)    Chronic lower back pain    Complication of anesthesia    "woke up during OR; I have an extremely high tolerance" (12/11/2011) 1 procedure was graft; the other procedures were procedures that are typically done with sedation.   DDD (degenerative disc disease), cervical    Depression    Dysrhythmia    "tachycardia" (12/11/2011) new onset afib 10/15/14 EKG   E coli bacteremia 06/18/2011   Elevated LDL cholesterol level 12/2018   ESRD (end stage renal disease) ([place]) 06/12/2011   Fibromyalgia    Gastroparesis    Gastroparesis    GERD (gastroesophageal reflux disease)    Glaucoma    right eye   Gout    H/O carpal tunnel syndrome    Headache 10/2019   Headache(784.0)    "not often anymore" (12/11/2011)   Herpes genitalia 1994   History of blood  transfusion    "more than a few times" (12/11/2011)   History of stomach ulcers    Hypotension    Iron deficiency anemia    New onset a-fib ([place])    10/15/14 EKG   Osteopenia    Peripheral neuropathy 11/2018   Pressure ulcer of sacral region, stage 1 07/2019   Seizures ([place]) 1994   "post transplant; only have had that one" (12/11/2011)   Spinal stenosis in cervical region    Stroke [facility])     left basal ganglia lacunar infarct; Right frontal lobe lacunar infarct.   Stroke [facility]) ~ 1999; 2001   "briefly lost my vision; lost my right eye" (12/11/2011)   Vitamin D deficiency 12/2018   Past Surgical  History:  Procedure Laterality Date   ANTERIOR CERVICAL DECOMP/DISCECTOMY FUSION N/A 01/08/2015   Procedure: Anterior Cervical Three-Four/Four-Five Decompression/Diskectomy/Fusion;  Surgeon: Leeroy Cha, MD;  Location: [place] NEURO ORS;  Service: Neurosurgery;  Laterality: N/A;  C3-4 C4-5 Anterior cervical decompression/diskectomy/fusion   APPENDECTOMY  ~ 2004   BIOPSY  09/12/2020   Procedure: BIOPSY;  Surgeon: Milus Banister, MD;  Location: [facility] ENDOSCOPY;  Service: Endoscopy;;   CATARACT EXTRACTION     right eye   COLONOSCOPY     COLONOSCOPY WITH PROPOFOL N/A 09/12/2020   Procedure: COLONOSCOPY WITH PROPOFOL;  Surgeon: Milus Banister, MD;  Location: [facility] ENDOSCOPY;  Service: Endoscopy;  Laterality: N/A;   ENUCLEATION  2001   "right"   ESOPHAGOGASTRODUODENOSCOPY (EGD) WITH PROPOFOL N/A 04/21/2012   Procedure: ESOPHAGOGASTRODUODENOSCOPY (EGD) WITH PROPOFOL;  Surgeon: Milus Banister, MD;  Location: [facility] ENDOSCOPY;  Service: Endoscopy;  Laterality: N/A;   ESOPHAGOGASTRODUODENOSCOPY (EGD) WITH PROPOFOL N/A 09/12/2020   Procedure: ESOPHAGOGASTRODUODENOSCOPY (EGD) WITH PROPOFOL;  Surgeon: Milus Banister, MD;  Location: [facility] ENDOSCOPY;  Service: Endoscopy;  Laterality: N/A;   INSERTION OF DIALYSIS CATHETER  1988   "AV graft LUA & LFA; LUA worked for 1 day; LFA never worked"   IR FLUORO GUIDE CV LINE RIGHT  07/11/2019   IR FLUORO GUIDE CV LINE RIGHT  10/20/2019   IR FLUORO GUIDE CV LINE RIGHT  05/31/2020   IR PTA VENOUS EXCEPT DIALYSIS CIRCUIT  05/31/2020   IR RADIOLOGY PERIPHERAL GUIDED IV START  07/11/2019   IR US GUIDE VASC ACCESS RIGHT  07/11/2019   IR US GUIDE VASC ACCESS RIGHT  07/11/2019   IR US GUIDE VASC ACCESS RIGHT  07/11/2019   IR VENOCAVAGRAM IVC  05/31/2020   KIDNEY TRANSPLANT  1994; 1999; 2005   "right"   MULTIPLE TOOTH EXTRACTIONS     POLYPECTOMY  09/12/2020   Procedure: POLYPECTOMY;  Surgeon: Milus Banister, MD;  Location: [facility] ENDOSCOPY;  Service: Endoscopy;;   RIGHT HEART CATH N/A 03/23/2019    Procedure: RIGHT HEART CATH;  Surgeon: Jolaine Artist, MD;  Location: [place] CV LAB;  Service: Cardiovascular;  Laterality: N/A;   [place]?; 1994; 2005    Current Outpatient Medications  Medication Sig Dispense Refill   albuterol (VENTOLIN HFA) 108 (90 Base) MCG/ACT inhaler Inhale 1-2 puffs into the lungs every 6 (six) hours as needed for wheezing or shortness of breath.     allopurinol (ZYLOPRIM) 100 MG tablet Take 1 tablet (100 mg total) by mouth daily. 90 tablet 3   amiodarone (PACERONE) 200 MG tablet Take 0.5 tablets (100 mg total) by mouth daily. 60 tablet 1   B Complex-C-Zn-Folic Acid (DIALYVITE 580-DXIP 15) 0.8 MG TABS Take 1 tablet by  mouth in the morning.     busPIRone (BUSPAR) 10 MG tablet Take 5 mg by mouth 2 (two) times daily.     calcium acetate (PHOSLO) 667 MG capsule Take 667 mg by mouth See admin instructions. Take 1 capsule (667 mg) by mouth with each meal and with each snack     citalopram (CELEXA) 20 MG tablet Take 20 mg by mouth every evening.     Colchicine 0.6 MG CAPS Take 0.6 mg by mouth daily as needed (gout flare up).     Cyanocobalamin (VITAMELTS ENERGY VITAMIN B-12) 1500 MCG TBDP Take 1 tablet by mouth daily. (Patient not taking: Reported on 09/09/2020) 90 tablet 0   diclofenac Sodium (VOLTAREN) 1 % GEL Apply 2 g topically 4 (four) times daily as needed (knee pain).     diphenhydrAMINE (BENADRYL) 25 mg capsule Take 1 capsule (25 mg total) by mouth every 8 (eight) hours as needed. (Patient taking differently: Take 25 mg by mouth every 8 (eight) hours as needed for itching.) 30 capsule 6   ELIQUIS 2.5 MG TABS tablet TAKE 1 TABLET BY MOUTH TWICE A DAY (Patient taking differently: Take 2.5 mg by mouth 2 (two) times daily.) 60 tablet 3   famotidine (PEPCID) 20 MG tablet Take 1 tablet (20 mg total) by mouth 2 (two) times daily. 60 tablet 11   fluticasone (FLONASE) 50 MCG/ACT nasal spray Place 1 spray into both nostrils daily as  needed for allergies or rhinitis.     gabapentin (NEURONTIN) 100 MG capsule TAKE 1 CAPSULE BY MOUTH TWICE A DAY MAX 2 CAPSULES DAILY (Patient taking differently: Take 100 mg by mouth 2 (two) times daily.) 60 capsule 6   ibuprofen (ADVIL) 800 MG tablet Take 800 mg by mouth every 8 (eight) hours as needed (pain).     latanoprost (XALATAN) 0.005 % ophthalmic solution Place 1 drop into the left eye at bedtime.  3   lidocaine (LIDODERM) 5 % Place 1 patch onto the skin daily as needed (for pain- remove old patch first).      metoCLOPramide (REGLAN) 5 MG tablet Take 1 tablet (5 mg total) by mouth 2 (two) times daily before a meal. 180 tablet 3   midodrine (PROAMATINE) 10 MG tablet Take 1.5 tablets (15 mg total) by mouth 3 (three) times daily. (Patient taking differently: Take 10 mg by mouth See admin instructions. Take 1 tablet (10 mg) by mouth twice daily Sundays, Monday, Wednesdays & Fridays Take 1 tablet (10 mg) by mouth 3 times daily Tuesdays, Thursdays & Saturdays) 405 tablet 3   nicotine (NICODERM CQ) 21 mg/24hr patch Place 1 patch (21 mg total) onto the skin daily. 90 patch 3   omeprazole (PRILOSEC) 40 MG capsule Take 1 capsule (40 mg total) by mouth daily. 90 capsule 1   ondansetron (ZOFRAN) 4 MG tablet TAKE 1 TABLET EVERY 8 HOURS AS NEEDED FOR NAUSEA AND VOMITING (Patient not taking: Reported on 09/09/2020) 90 tablet 0   ondansetron (ZOFRAN-ODT) 4 MG disintegrating tablet Take 1 tablet (4 mg total) by mouth every 8 (eight) hours as needed for nausea or vomiting. 20 tablet 11   oxyCODONE-acetaminophen (PERCOCET) 10-325 MG tablet Take 1 tablet by mouth 4 (four) times daily as needed for pain.     predniSONE (DELTASONE) 1 MG tablet Take 1 mg by mouth in the morning.     Vitamin D, Ergocalciferol, (DRISDOL) 1.25 MG (50000 UNIT) CAPS capsule TAKE 1 CAPSULE (50,000 UNITS TOTAL) BY MOUTH EVERY 7 (SEVEN) DAYS. (Patient taking  differently: Take 50,000 Units by mouth every Sunday.) 5 capsule 6   zolpidem  (AMBIEN) 10 MG tablet Take 1 tablet (10 mg total) by mouth at bedtime as needed for sleep. 10 tablet 0   No current facility-administered medications for this encounter.    Allergies  Allergen Reactions   Levofloxacin Itching and Rash   Tape Other (See Comments)    "Certain surgical tapes peel off my skin"    Social History   Socioeconomic History   Marital status: Single    Spouse name: Not on file   Number of children: 0   Years of education: some college   Highest education level: Some college, no degree  Occupational History   Occupation: disabled    Fish farm manager: UNEMPLOYED  Tobacco Use   Smoking status: Every Day    Packs/day: 0.33    Years: 35.00    Pack years: 11.55    Types: Cigarettes   Smokeless tobacco: Never   Tobacco comments:    half pack daily  Vaping Use   Vaping Use: Never used  Substance and Sexual Activity   Alcohol use: Yes    Alcohol/week: 1.0 standard drink    Types: 1 Standard drinks or equivalent per week    Comment: rare if out to dinner   Drug use: Yes    Frequency: 1.0 times per week    Types: Marijuana    Comment: smoking once a day when in severe pain   Sexual activity: Yes    Partners: Male    Birth control/protection: None  Other Topics Concern   Not on file  Social History Narrative   Right-handed.   Four cups caffeine per day.   Lives alone.   Social Determinants of Health   Financial Resource Strain: Low Risk    Difficulty of Paying Living Expenses: Not hard at all  Food Insecurity: No Food Insecurity   Worried About Charity fundraiser in the Last Year: Never true   [place] in the Last Year: Never true  Transportation Needs: No Transportation Needs   Lack of Transportation (Medical): No   Lack of Transportation (Non-Medical): No  Physical Activity: Insufficiently Active   Days of Exercise per Week: 3 days   Minutes of Exercise per Session: 10 min  Stress: Stress Concern Present   Feeling of Stress : To some  extent  Social Connections: Moderately Isolated   Frequency of Communication with Friends and Family: More than three times a week   Frequency of Social Gatherings with Friends and Family: More than three times a week   Attends Religious Services: More than 4 times per year   Active Member of [employer] or Organizations: No   Attends Music therapist: Never   Marital Status: Never married  Human resources officer Violence: Not At Risk   Fear of Current or Ex-Partner: No   Emotionally Abused: No   Physically Abused: No   Sexually Abused: No    Family History  Problem Relation Age of Onset   Glaucoma Mother    Pancreatic cancer Father    Multiple sclerosis Brother    Hypertension Maternal Grandmother    Breast cancer Neg Hx     ROS- All systems are reviewed and negative except as per the HPI above  Physical Exam: There were no vitals filed for this visit. Wt Readings from Last 3 Encounters:  09/12/20 48.1 kg  07/18/20 47.2 kg  06/12/20 52.6 kg    Labs: Lab  Results  Component Value Date   NA 134 (L) 09/12/2020   K 3.7 09/12/2020   CL 100 09/12/2020   CO2 24 07/18/2020   GLUCOSE 75 09/12/2020   BUN 30 (H) 09/12/2020   CREATININE 6.00 (H) 09/12/2020   CALCIUM 9.1 07/18/2020   PHOS 3.3 08/24/2019   MG 1.7 06/14/2011   Lab Results  Component Value Date   INR 1.1 03/28/2020   Lab Results  Component Value Date   CHOL 214 (H) 12/06/2018   HDL 89 12/06/2018   LDLCALC 105 (H) 12/06/2018   TRIG 118 12/06/2018     GEN- The patient is well appearing, alert and oriented x 3 today.   Head- normocephalic, atraumatic Eyes-  Sclera clear, conjunctiva pink Ears- hearing intact Oropharynx- clear Neck- supple, no JVP Lymph- no cervical lymphadenopathy Lungs- Clear to ausculation bilaterally, normal work of breathing Heart- Regular rate and rhythm, 2/6 systolic murmur, no rubs or gallops, PMI not laterally displaced GI- soft, NT, ND, + BS Extremities- no clubbing,  cyanosis, or edema MS- no significant deformity or atrophy Skin- no rash or lesion Psych- euthymic mood, full affect Neuro- strength and sensation are intact  EKG-NSR at 62 bpm, pr int 182 ms, qrs int 72 ms, qtc 503 ms   Echo- 1. Left ventricular ejection fraction, by estimation, is 65 to 70%. The  left ventricle has normal function. The left ventricle has no regional  wall motion abnormalities. Left ventricular diastolic parameters were  normal.   2. Right ventricular systolic function is normal. The right ventricular  size is normal. There is normal pulmonary artery systolic pressure.   3. The mitral valve is normal in structure. No evidence of mitral valve  regurgitation. No evidence of mitral stenosis.   4. The aortic valve is normal in structure. Aortic valve regurgitation is  not visualized. No aortic stenosis is present.   5. The inferior vena cava is normal in size with greater than 50%  respiratory variability, suggesting right atrial pressure of 3 mmHg.   Zio patch-Patient had a min HR of 52 bpm, max HR of 208 bpm, and avg HR of 78 bpm. Predominant underlying rhythm was Sinus Rhythm. Atrial Flutter occurred (<1% burden), ranging from 113-208 bpm (avg of 148 bpm), the longest lasting 1 hour 16 mins with an avg rate of 139 bpm. Atrial Flutter was detected within +/- 45 seconds of symptomatic patient event(s).  Stress test- 04/12/20-Study Highlights    The left ventricular ejection fraction is hyperdynamic (>65%). Nuclear stress EF: 75%. There was no ST segment deviation noted during stress. Defect 1: There is a medium defect of mild severity present in the mid anterior location. The study is normal. This is a low risk study.  EKG from ER reviewed and showed atrial flutter at 2:1, probably typical  Assessment and Plan: 1. Atrial  flutter  Doing well staying in rhythm with 100 mg daily amiodarone  Cannot use daily rate control for hypotension  Amiodarone is her choice  of drug 2/2 ESRD and HD Tsh/cmet today   2. CHA2DS2VASc score of 6 Eliquis 2.5 mg bid for weight less than 60 KG and ESRD  3. Hypotension  Stable, not symptomatic   F/u in afib clinic in 6 months  Butch Penny C. Lynell Greenhouse, [facility] [address] [place], [place] [address] [phone]

## 2020-09-20 LAB — T3, FREE: T3, Free: 1.4 pg/mL — ABNORMAL LOW (ref 2.0–4.4)

## 2020-09-20 LAB — T3: T3, Total: 45 ng/dL — ABNORMAL LOW (ref 71–180)

## 2020-09-27 ENCOUNTER — Encounter (HOSPITAL_COMMUNITY): Payer: Self-pay | Admitting: *Deleted

## 2020-10-01 ENCOUNTER — Encounter (HOSPITAL_COMMUNITY): Payer: Self-pay | Admitting: *Deleted

## 2020-10-09 ENCOUNTER — Encounter (HOSPITAL_COMMUNITY): Payer: Self-pay | Admitting: *Deleted

## 2020-10-11 IMAGING — US VENOUS DOPPLER ULTRASOUND OF  LOWER EXTREMITIES
1 series · 13 of 24 positions shown · non-contrast
Comparison: Bilateral lower extremity venous Doppler
ultrasound-06/09/2006

CLINICAL DATA: Bilateral lower extremity edema. History of kidney
transplant. Evaluate for DVT.



[Series 1: venous doppler ultrasound of lower extremities · 0.06mm/px · 13 of 56 slices shown]
[im 1/56]
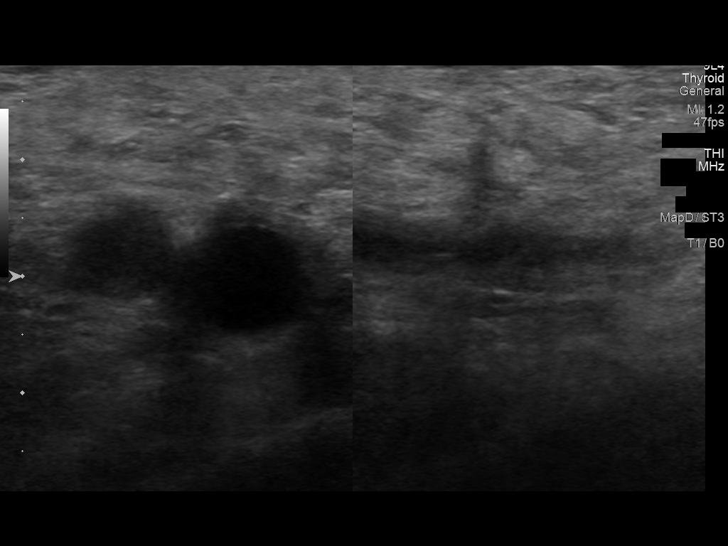
[im 5/56]
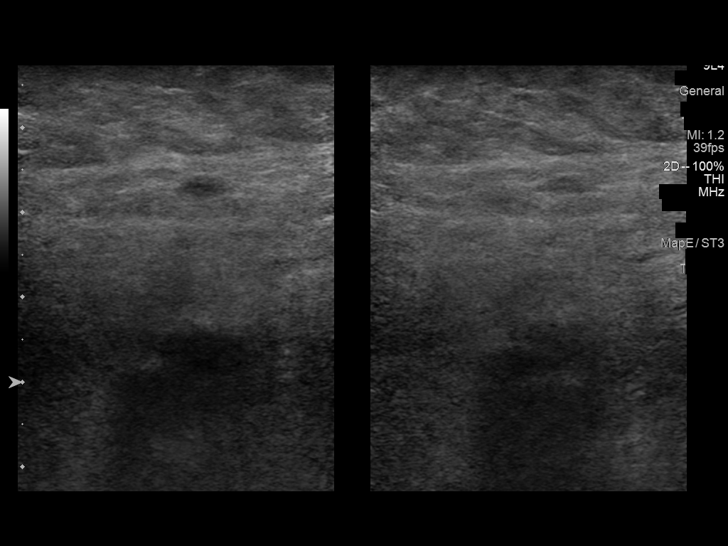
[im 10/56]
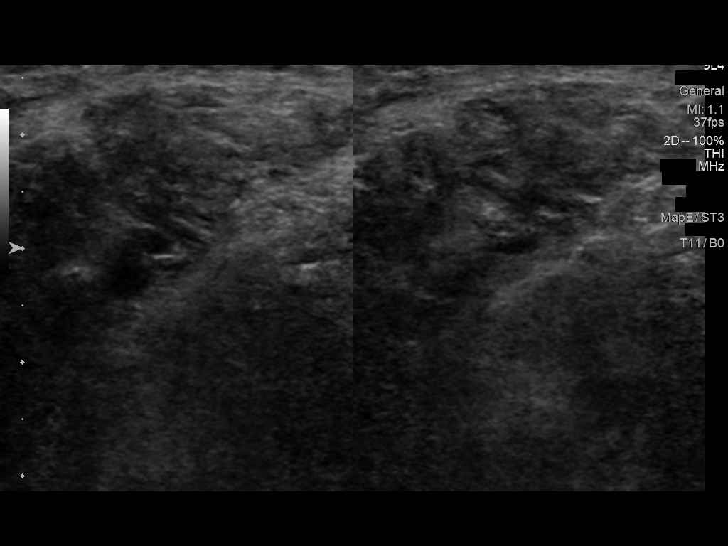
[im 15/56]
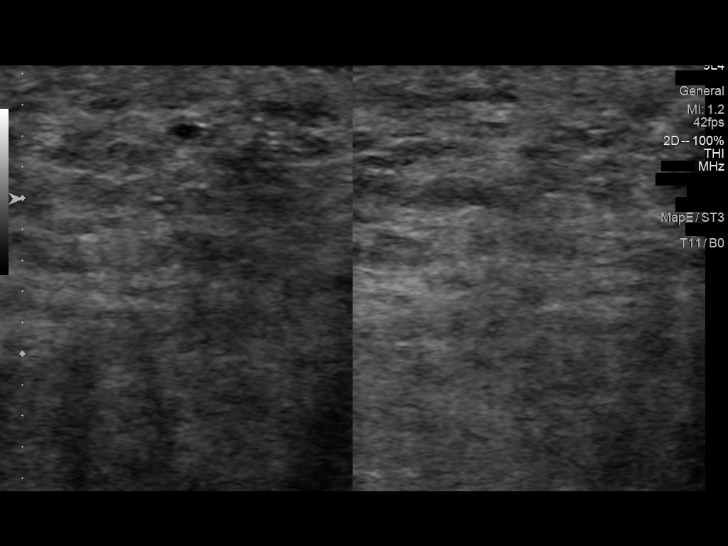
[im 20/56]
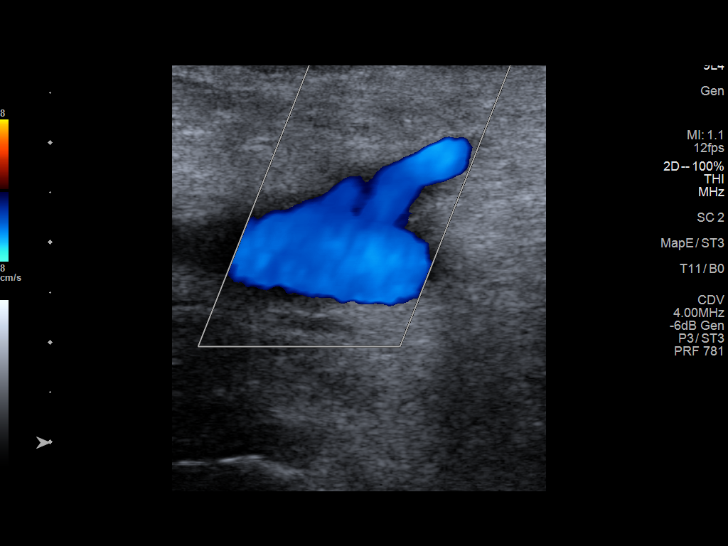
[im 24/56]
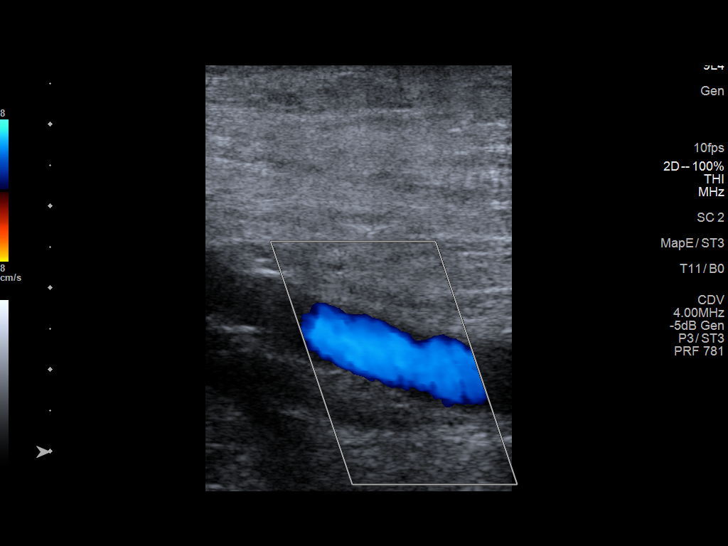
[im 29/56]
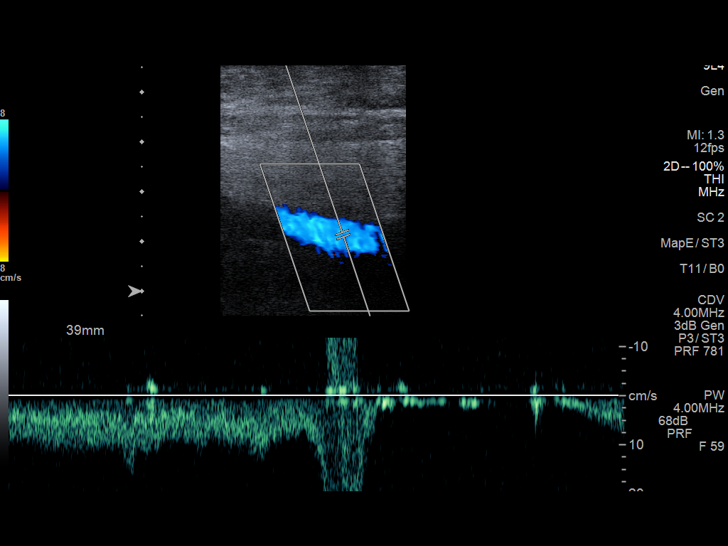
[im 32/56]
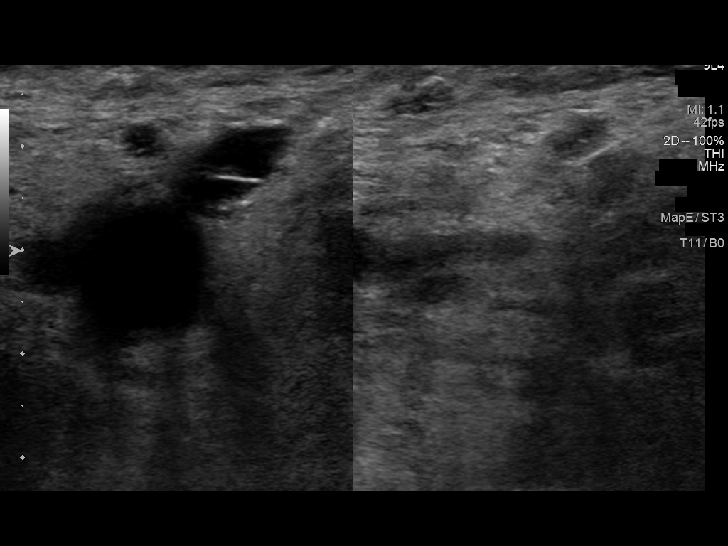
[im 36/56]
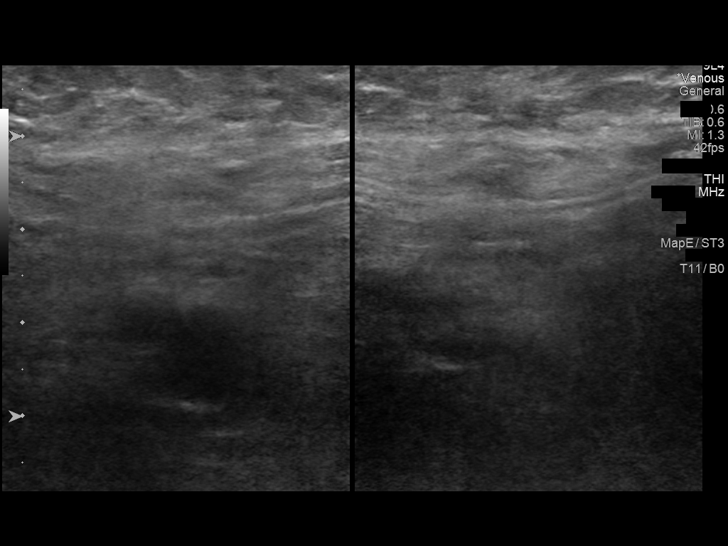
[im 41/56]
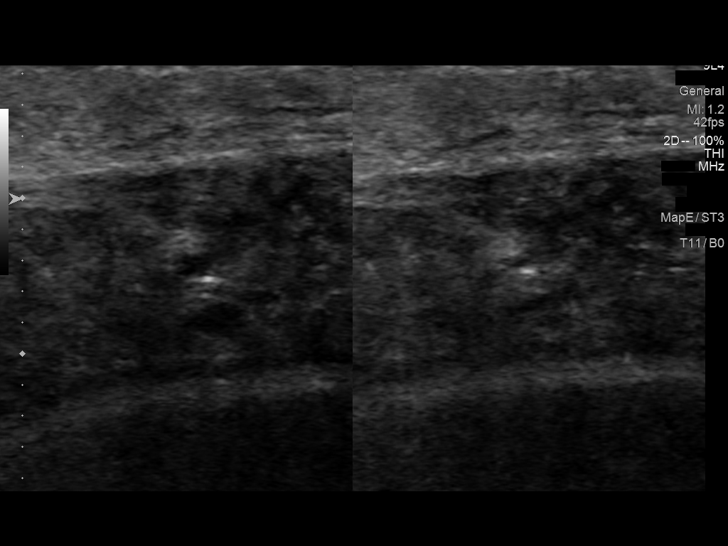
[im 46/56]
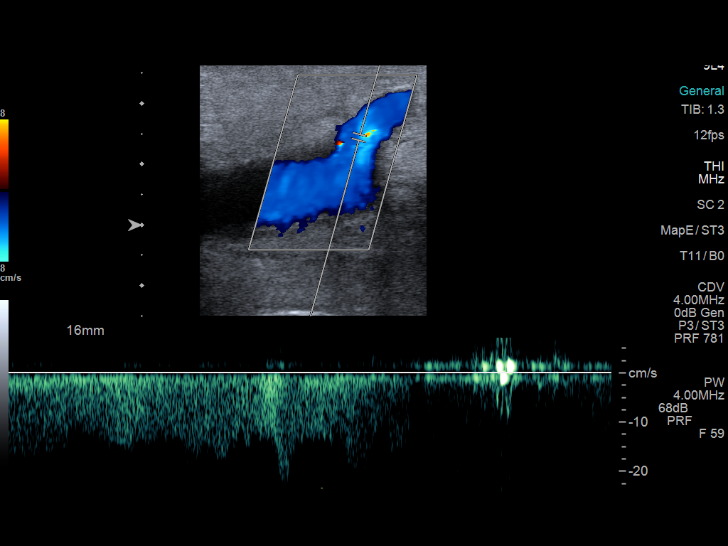
[im 51/56]
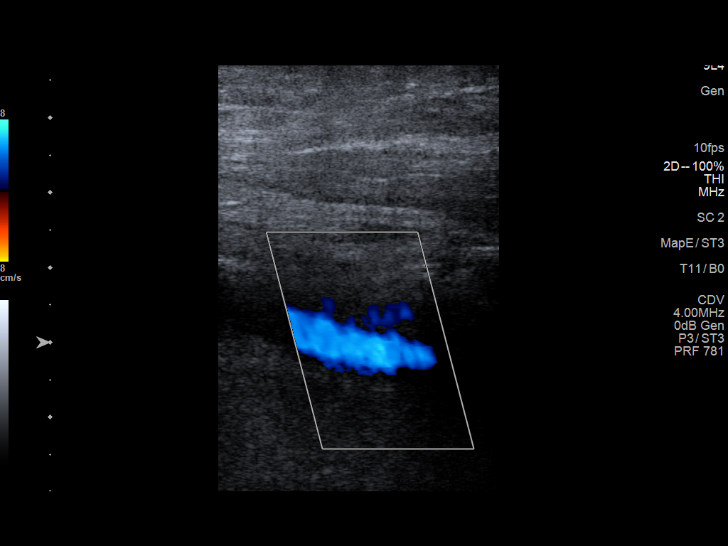
[im 56/56]
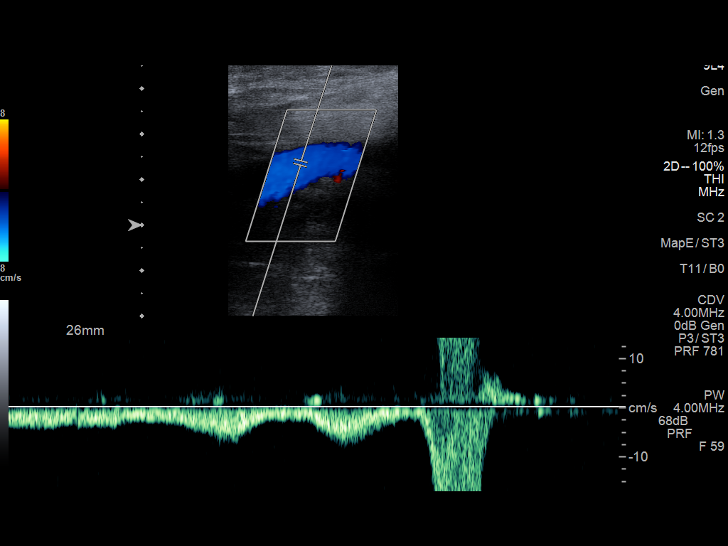

[13 of 24 positions shown; findings below may reference images not displayed]

FINDINGS: RIGHT LOWER EXTREMITY

Common Femoral Vein: No evidence of thrombus. Normal
compressibility, respiratory phasicity and response to augmentation.

Saphenofemoral Junction: No evidence of thrombus. Normal
compressibility and flow on color Doppler imaging.

Profunda Femoral Vein: No evidence of thrombus. Normal
compressibility and flow on color Doppler imaging.

Femoral Vein: No evidence of thrombus. Normal compressibility,
respiratory phasicity and response to augmentation.

Popliteal Vein: No evidence of thrombus. Normal compressibility,
respiratory phasicity and response to augmentation.

Calf Veins: No evidence of thrombus. Normal compressibility and flow
on color Doppler imaging.

Superficial Great Saphenous Vein: No evidence of thrombus. Normal
compressibility.

Venous Reflux:  None.

Other Findings:  None.

LEFT LOWER EXTREMITY

Common Femoral Vein: No evidence of thrombus. Normal
compressibility, respiratory phasicity and response to augmentation.

Saphenofemoral Junction: No evidence of thrombus. Normal
compressibility and flow on color Doppler imaging.

Profunda Femoral Vein: No evidence of thrombus. Normal
compressibility and flow on color Doppler imaging.

Femoral Vein: No evidence of thrombus. Normal compressibility,
respiratory phasicity and response to augmentation.

Popliteal Vein: No evidence of thrombus. Normal compressibility,
respiratory phasicity and response to augmentation.

Calf Veins: No evidence of thrombus. Normal compressibility and flow
on color Doppler imaging.

Superficial Great Saphenous Vein: No evidence of thrombus. Normal
compressibility.

Venous Reflux:  None.

Other Findings:  None.
IMPRESSION: No evidence of DVT within either lower extremity.

## 2020-10-23 ENCOUNTER — Other Ambulatory Visit: Payer: Self-pay

## 2020-10-23 ENCOUNTER — Encounter: Payer: Self-pay | Admitting: Nurse Practitioner

## 2020-10-23 ENCOUNTER — Ambulatory Visit (INDEPENDENT_AMBULATORY_CARE_PROVIDER_SITE_OTHER): Payer: Medicare Other | Admitting: Nurse Practitioner

## 2020-10-23 VITALS — BP 135/68 | HR 79 | Temp 98.1°F | Ht 59.0 in | Wt 110.4 lb

## 2020-10-23 DIAGNOSIS — I739 Peripheral vascular disease, unspecified: Secondary | ICD-10-CM | POA: Diagnosis not present

## 2020-10-23 DIAGNOSIS — J9620 Acute and chronic respiratory failure, unspecified whether with hypoxia or hypercapnia: Secondary | ICD-10-CM | POA: Insufficient documentation

## 2020-10-23 DIAGNOSIS — Z992 Dependence on renal dialysis: Secondary | ICD-10-CM

## 2020-10-23 DIAGNOSIS — N186 End stage renal disease: Secondary | ICD-10-CM

## 2020-10-23 DIAGNOSIS — R2681 Unsteadiness on feet: Secondary | ICD-10-CM

## 2020-10-23 DIAGNOSIS — I5032 Chronic diastolic (congestive) heart failure: Secondary | ICD-10-CM | POA: Diagnosis not present

## 2020-10-23 DIAGNOSIS — D61818 Other pancytopenia: Secondary | ICD-10-CM

## 2020-10-23 DIAGNOSIS — J9601 Acute respiratory failure with hypoxia: Secondary | ICD-10-CM | POA: Diagnosis not present

## 2020-10-23 DIAGNOSIS — K3184 Gastroparesis: Secondary | ICD-10-CM

## 2020-10-23 MED ORDER — METOCLOPRAMIDE HCL 10 MG PO TABS: 10.0000 mg | ORAL_TABLET | Freq: Three times a day (TID) | ORAL | 0 refills | Status: DC | PRN

## 2020-10-23 NOTE — Progress Notes (Signed)
[facility] [place], [place]  [address] Phone:  [phone]   Fax:  [phone]   Established Patient Office Visit  Subjective:  Patient ID: Tina Watkins, female    DOB: Sep 09, 1965  Age: 55 y.o. MRN: [id number]  CC:  Chief Complaint  Patient presents with   Abdominal Pain    Pt is here for here follow up visit. Pt states that she is not feeling well today due to her gastroparesis flaring up. Pt is requesting a referral for occupational therapy for her unstablized mobility.    HPI Tina Watkins presents for a Occupational therapy evaluation. She  has a past medical history of Bacteremia due to Gram-negative bacteria (05/23/2011), Blind, Blind right eye, CHF (congestive heart failure) ([place]), Chronic lower back pain, Complication of anesthesia, DDD (degenerative disc disease), cervical, Depression, Dysrhythmia, E coli bacteremia (06/18/2011), Elevated LDL cholesterol level (12/2018), ESRD (end stage renal disease) ([place]) (06/12/2011), Fibromyalgia, Gastroparesis, Gastroparesis, GERD (gastroesophageal reflux disease), Glaucoma, Gout, H/O carpal tunnel syndrome, Headache (10/2019), Headache(784.0), Herpes genitalia (1994), History of blood transfusion, History of stomach ulcers, Hypotension, Iron deficiency anemia, New onset a-fib ([place]), Osteopenia, Peripheral neuropathy (11/2018), Pressure ulcer of sacral region, stage 1 (07/2019), Seizures ([place]) (1994), Spinal stenosis in cervical region, Stroke [facility]), Stroke ([place]) (~ 1999; 2001), and Vitamin D deficiency (12/2018).   She is in today requesting assistance. She has increased weakness and mobility issues. She is requesting occupational therapy. She lives alone but reports that she is able to complete ADL's without difficulty. She does her dialysis on Tues, Thurs and Sat.  She is suffering from gastroparesis with nausea and vomiting . She reports needing additional Reglan 10 mg in addition to  her current reglan 5mg  to assist her further.  Past Medical History:  Diagnosis Date   Bacteremia due to Gram-negative bacteria 05/23/2011   Blind    right eye   Blind right eye    CHF (congestive heart failure) (HCC)    Chronic lower back pain    Complication of anesthesia    "woke up during OR; I have an extremely high tolerance" (12/11/2011) 1 procedure was graft; the other procedures were procedures that are typically done with sedation.   DDD (degenerative disc disease), cervical    Depression    Dysrhythmia    "tachycardia" (12/11/2011) new onset afib 10/15/14 EKG   E coli bacteremia 06/18/2011   Elevated LDL cholesterol level 12/2018   ESRD (end stage renal disease) ([place]) 06/12/2011   Fibromyalgia    Gastroparesis    Gastroparesis    GERD (gastroesophageal reflux disease)    Glaucoma    right eye   Gout    H/O carpal tunnel syndrome    Headache 10/2019   Headache(784.0)    "not often anymore" (12/11/2011)   Herpes genitalia 1994   History of blood transfusion    "more than a few times" (12/11/2011)   History of stomach ulcers    Hypotension    Iron deficiency anemia    New onset a-fib ([place])    10/15/14 EKG   Osteopenia    Peripheral neuropathy 11/2018   Pressure ulcer of sacral region, stage 1 07/2019   Seizures ([place]) 1994   "post transplant; only have had that one" (12/11/2011)   Spinal stenosis in cervical region    Stroke [facility])     left basal ganglia lacunar infarct; Right frontal lobe lacunar infarct.   Stroke [facility]) ~ 1999; 2001   "  briefly lost my vision; lost my right eye" (12/11/2011)   Vitamin D deficiency 12/2018    Past Surgical History:  Procedure Laterality Date   ANTERIOR CERVICAL DECOMP/DISCECTOMY FUSION N/A 01/08/2015   Procedure: Anterior Cervical Three-Four/Four-Five Decompression/Diskectomy/Fusion;  Surgeon: Leeroy Cha, MD;  Location: [facility]URO ORS;  Service: Neurosurgery;  Laterality: N/A;  C3-4 C4-5 Anterior cervical  decompression/diskectomy/fusion   APPENDECTOMY  ~ 2004   BIOPSY  09/12/2020   Procedure: BIOPSY;  Surgeon: Milus Banister, MD;  Location: [facility] ENDOSCOPY;  Service: Endoscopy;;   CATARACT EXTRACTION     right eye   COLONOSCOPY     COLONOSCOPY WITH PROPOFOL N/A 09/12/2020   Procedure: COLONOSCOPY WITH PROPOFOL;  Surgeon: Milus Banister, MD;  Location: [facility] ENDOSCOPY;  Service: Endoscopy;  Laterality: N/A;   ENUCLEATION  2001   "right"   ESOPHAGOGASTRODUODENOSCOPY (EGD) WITH PROPOFOL N/A 04/21/2012   Procedure: ESOPHAGOGASTRODUODENOSCOPY (EGD) WITH PROPOFOL;  Surgeon: Milus Banister, MD;  Location: [facility] ENDOSCOPY;  Service: Endoscopy;  Laterality: N/A;   ESOPHAGOGASTRODUODENOSCOPY (EGD) WITH PROPOFOL N/A 09/12/2020   Procedure: ESOPHAGOGASTRODUODENOSCOPY (EGD) WITH PROPOFOL;  Surgeon: Milus Banister, MD;  Location: [facility];  Service: Endoscopy;  Laterality: N/A;   INSERTION OF DIALYSIS CATHETER  1988   "AV graft LUA & LFA; LUA worked for 1 day; LFA never worked"   IR FLUORO GUIDE CV LINE RIGHT  07/11/2019   IR FLUORO GUIDE CV LINE RIGHT  10/20/2019   IR FLUORO GUIDE CV LINE RIGHT  05/31/2020   IR PTA VENOUS EXCEPT DIALYSIS CIRCUIT  05/31/2020   IR RADIOLOGY PERIPHERAL GUIDED IV START  07/11/2019   IR US GUIDE VASC ACCESS RIGHT  07/11/2019   IR US GUIDE VASC ACCESS RIGHT  07/11/2019   IR US GUIDE VASC ACCESS RIGHT  07/11/2019   IR VENOCAVAGRAM IVC  05/31/2020   KIDNEY TRANSPLANT  1994; 1999; 2005   "right"   MULTIPLE TOOTH EXTRACTIONS     POLYPECTOMY  09/12/2020   Procedure: POLYPECTOMY;  Surgeon: Milus Banister, MD;  Location: [facility] END[facility];  Service: Endoscopy;;   RIGHT HEART CATH N/A 03/23/2019   Procedure: RIGHT HEART CATH;  Surgeon: Jolaine Artist, MD;  Location: [facility] CV LAB;  Service: Cardiovascular;  Laterality: N/A;   [place]?; 1994; 2005    Family History  Problem Relation Age of Onset   Glaucoma Mother    Pancreatic cancer Father     Multiple sclerosis Brother    Hypertension Maternal Grandmother    Breast cancer Neg Hx     Social History   Socioeconomic History   Marital status: Single    Spouse name: Not on file   Number of children: 0   Years of education: some college   Highest education level: Some college, no degree  Occupational History   Occupation: disabled    Fish farm manager: UNEMPLOYED  Tobacco Use   Smoking status: Every Day    Packs/day: 0.33    Years: 35.00    Pack years: 11.55    Types: Cigarettes   Smokeless tobacco: Never   Tobacco comments:    Pack of cigarettes lasts 2 days 09/18/2020  Vaping Use   Vaping Use: Never used  Substance and Sexual Activity   Alcohol use: Not Currently    Alcohol/week: 1.0 standard drink    Types: 1 Standard drinks or equivalent per week    Comment: rare if out to dinner   Drug use: Not Currently  Frequency: 1.0 times per week    Types: Marijuana    Comment: smoking once a day when in severe pain   Sexual activity: Not Currently    Partners: Male    Birth control/protection: None  Other Topics Concern   Not on file  Social History Narrative   Right-handed.   Four cups caffeine per day.   Lives alone.   Social Determinants of Health   Financial Resource Strain: Low Risk    Difficulty of Paying Living Expenses: Not hard at all  Food Insecurity: No Food Insecurity   Worried About Charity fundraiser in the Last Year: Never true   [place] in the Last Year: Never true  Transportation Needs: No Transportation Needs   Lack of Transportation (Medical): No   Lack of Transportation (Non-Medical): No  Physical Activity: Insufficiently Active   Days of Exercise per Week: 3 days   Minutes of Exercise per Session: 10 min  Stress: Stress Concern Present   Feeling of Stress : To some extent  Social Connections: Not on file  Intimate Partner Violence: Not on file    Outpatient Medications Prior to Visit  Medication Sig Dispense Refill   albuterol  (VENTOLIN HFA) 108 (90 Base) MCG/ACT inhaler Inhale 1-2 puffs into the lungs every 6 (six) hours as needed for wheezing or shortness of breath.     allopurinol (ZYLOPRIM) 100 MG tablet Take 1 tablet (100 mg total) by mouth daily. 90 tablet 3   amiodarone (PACERONE) 200 MG tablet Take 0.5 tablets (100 mg total) by mouth daily. 60 tablet 1   busPIRone (BUSPAR) 10 MG tablet Take 5 mg by mouth 2 (two) times daily.     calcium acetate (PHOSLO) 667 MG capsule Take 667 mg by mouth See admin instructions. Take 1 capsule (667 mg) by mouth with each meal and with each snack     citalopram (CELEXA) 20 MG tablet Take 20 mg by mouth every evening.     Colchicine 0.6 MG CAPS Take 0.6 mg by mouth daily as needed (gout flare up).     diphenhydrAMINE (BENADRYL) 25 mg capsule Take 1 capsule (25 mg total) by mouth every 8 (eight) hours as needed. 30 capsule 6   ELIQUIS 2.5 MG TABS tablet TAKE 1 TABLET BY MOUTH TWICE A DAY 60 tablet 3   famotidine (PEPCID) 20 MG tablet Take 1 tablet (20 mg total) by mouth 2 (two) times daily. 60 tablet 11   fluticasone (FLONASE) 50 MCG/ACT nasal spray Place 1 spray into both nostrils daily as needed for allergies or rhinitis.     gabapentin (NEURONTIN) 100 MG capsule TAKE 1 CAPSULE BY MOUTH TWICE A DAY MAX 2 CAPSULES DAILY 60 capsule 6   ibuprofen (ADVIL) 800 MG tablet Take 800 mg by mouth every 8 (eight) hours as needed (pain).     latanoprost (XALATAN) 0.005 % ophthalmic solution Place 1 drop into the left eye at bedtime.  3   lidocaine (LIDODERM) 5 % Place 1 patch onto the skin daily as needed (for pain- remove old patch first).      metoCLOPramide (REGLAN) 5 MG tablet Take 1 tablet (5 mg total) by mouth 2 (two) times daily before a meal. 180 tablet 3   midodrine (PROAMATINE) 10 MG tablet Take 1.5 tablets (15 mg total) by mouth 3 (three) times daily. (Patient taking differently: Take 10 mg by mouth See admin instructions. Take 1 tablet (10 mg) by mouth twice daily Sundays, Monday,  Wednesdays & Fridays Take 1 tablet (10 mg) by mouth 3 times daily Tuesdays, Thursdays & Saturdays) 405 tablet 3   nicotine (NICODERM CQ) 21 mg/24hr patch Place 1 patch (21 mg total) onto the skin daily. 90 patch 3   omeprazole (PRILOSEC) 40 MG capsule Take 1 capsule (40 mg total) by mouth daily. 90 capsule 1   ondansetron (ZOFRAN-ODT) 4 MG disintegrating tablet Take 1 tablet (4 mg total) by mouth every 8 (eight) hours as needed for nausea or vomiting. 20 tablet 11   oxyCODONE-acetaminophen (PERCOCET) 10-325 MG tablet Take 1 tablet by mouth 4 (four) times daily as needed for pain.     Vitamin D, Ergocalciferol, (DRISDOL) 1.25 MG (50000 UNIT) CAPS capsule TAKE 1 CAPSULE (50,000 UNITS TOTAL) BY MOUTH EVERY 7 (SEVEN) DAYS. 5 capsule 6   zolpidem (AMBIEN) 10 MG tablet Take 1 tablet (10 mg total) by mouth at bedtime as needed for sleep. 10 tablet 0   predniSONE (DELTASONE) 1 MG tablet Take 1 mg by mouth in the morning.     No facility-administered medications prior to visit.    Allergies  Allergen Reactions   Levofloxacin Itching and Rash   Tape Other (See Comments)    "Certain surgical tapes peel off my skin"    ROS Review of Systems    Objective:    Physical Exam Constitutional:      General: She is in acute distress (ongoing emesis).     Appearance: She is not diaphoretic.  HENT:     Head: Normocephalic and atraumatic.  Eyes:     Comments: Glasses in use  Cardiovascular:     Rate and Rhythm: Normal rate and regular rhythm.     Heart sounds: Normal heart sounds.  Pulmonary:     Breath sounds: Normal breath sounds.  Abdominal:     Palpations: Abdomen is soft.     Tenderness: There is abdominal tenderness.  Skin:    Comments: BLE tight and hard no pitting edema  Neurological:     General: No focal deficit present.     Mental Status: She is alert.    BP 135/68   Pulse 79   Temp 98.1 F (36.7 C)   Ht 4\' 11"  (1.499 m)   Wt 110 lb 6.4 oz (50.1 kg)   SpO2 98%   BMI 22.30  kg/m  Wt Readings from Last 3 Encounters:  10/23/20 110 lb 6.4 oz (50.1 kg)  09/18/20 114 lb 3.2 oz (51.8 kg)  09/12/20 106 lb (48.1 kg)     Health Maintenance Due  Topic Date Due   TETANUS/TDAP  Never done   Zoster Vaccines- Shingrix (1 of 2) Never done   PAP SMEAR-Modifier  Never done   INFLUENZA VACCINE  09/02/2020    There are no preventive care reminders to display for this patient.  Lab Results  Component Value Date   TSH 4.632 (H) 09/18/2020   Lab Results  Component Value Date   WBC 5.4 10/24/2020   HGB 11.4 (L) 10/24/2020   HCT 35.3 (L) 10/24/2020   MCV 92.2 10/24/2020   PLT 209 10/24/2020   Lab Results  Component Value Date   NA 130 (L) 10/24/2020   K 4.1 10/24/2020   CO2 21 (L) 10/24/2020   GLUCOSE 114 (H) 10/24/2020   BUN 35 (H) 10/24/2020   CREATININE 9.04 (H) 10/24/2020   BILITOT 0.8 10/24/2020   ALKPHOS 123 10/24/2020   AST 29 10/24/2020   ALT 16 10/24/2020   PROT  7.2 10/24/2020   ALBUMIN 4.1 10/24/2020   CALCIUM 9.8 10/24/2020   ANIONGAP 15 10/24/2020   Lab Results  Component Value Date   CHOL 214 (H) 12/06/2018   Lab Results  Component Value Date   HDL 89 12/06/2018   Lab Results  Component Value Date   LDLCALC 105 (H) 12/06/2018   Lab Results  Component Value Date   TRIG 118 12/06/2018   Lab Results  Component Value Date   CHOLHDL 2.4 12/06/2018   Lab Results  Component Value Date   HGBA1C 4.8 05/08/2019      Assessment & Plan:   Problem List Items Addressed This Visit       Respiratory   Acute respiratory failure with hypoxia (HCC) - Primary     Digestive   Gastroparesis Worsening Declined ED   Relevant Medications   metoCLOPramide (REGLAN) 10 MG tablet     Hematopoietic and Hemostatic   Other pancytopenia ([place])   Other Visit Diagnoses     Chronic diastolic heart failure (HCC)   (Chronic)     Relevant Orders   Ambulatory referral to [place]   PAD (peripheral artery disease) ([place])   (Chronic)      Relevant Orders   Ambulatory referral to [place]   ESRD (end stage renal disease) on dialysis ([place])   (Chronic)   Continuous T, TH, S    Relevant Orders   Ambulatory referral to [place]   Difficulty standing  Persistent  Encourage assistive devices and PT      Relevant Orders   Ambulatory referral to [place] ordered this encounter  Medications   metoCLOPramide (REGLAN) 10 MG tablet    Sig: Take 1 tablet (10 mg total) by mouth every 8 (eight) hours as needed for nausea.    Dispense:  90 tablet    Refill:  0    Order Specific Question:   Supervising Provider    Answer:   Tresa Garter [[id number]]    Follow-up: Return in about 3 months (around 01/22/2021).    Vevelyn Francois, NP

## 2020-10-23 NOTE — Patient Instructions (Signed)
Gastroparesis  Gastroparesis is a condition in which food takes longer than normal to empty from the stomach. This condition is also known as delayed gastric emptying. It is usually a long-term (chronic) condition. There is no cure, but there are treatments and things that you can do at home to help relieve symptoms. Treating the underlying condition that causesgastroparesis can also help relieve symptoms. What are the causes? In many cases, the cause of this condition is not known. Possible causes include: A hormone (endocrine) disorder, such as hypothyroidism or diabetes. A nervous system disease, such as Parkinson's disease or multiple sclerosis. Cancer, infection, or surgery that affects the stomach or vagus nerve. The vagus nerve runs from your chest, through your neck, and to the lower part of your brain. A connective tissue disorder, such as scleroderma. Certain medicines. What increases the risk? You are more likely to develop this condition if: You have certain disorders or diseases. These may include: An endocrine disorder. An eating disorder. Amyloidosis. Scleroderma. Parkinson's disease. Multiple sclerosis. Cancer or infection of the stomach or the vagus nerve. You have had surgery on your stomach or vagus nerve. You take certain medicines. You are female. What are the signs or symptoms? Symptoms of this condition include: Feeling full after eating very little or a loss of appetite. Nausea, vomiting, or heartburn. Bloating of your abdomen. Inconsistent blood sugar (glucose) levels on blood tests. Unexplained weight loss. Acid from the stomach coming up into the esophagus (gastroesophageal reflux). Sudden tightening (spasm) of the stomach, which can be painful. Symptoms may come and go. Some people may not notice any symptoms. How is this diagnosed? This condition is diagnosed with tests, such as: Tests that check how long it takes food to move through the stomach and  intestines. These tests include: Upper gastrointestinal (GI) series. For this test, you drink a liquid that shows up well on X-rays, and then X-rays are taken of your intestines. Gastric emptying scintigraphy. For this test, you eat food that contains a small amount of radioactive material, and then scans are taken. Wireless capsule GI monitoring system. For this test, you swallow a pill (capsule) that records information about how foods and fluid move through your stomach. Gastric manometry. For this test, a tube is passed down your throat and into your stomach to measure electrical and muscular activity. Endoscopy. For this test, a long, thin tube with a camera and light on the end is passed down your throat and into your stomach to check for problems in your stomach lining. Ultrasound. This test uses sound waves to create images of the inside of your body. This can help rule out gallbladder disease or pancreatitis as a cause of your symptoms. How is this treated? There is no cure for this condition, but treatment and home care may relieve symptoms. Treatment may include: Treating the underlying cause. Managing your symptoms by making changes to your diet and exercise habits. Taking medicines to control nausea and vomiting and to stimulate stomach muscles. Getting food through a feeding tube in the hospital. This may be done in severe cases. Having surgery to insert a device called a gastric electrical stimulator into your body. This device helps improve stomach emptying and control nausea and vomiting. Follow these instructions at home: Take over-the-counter and prescription medicines only as told by your health care provider. Follow instructions from your health care provider about eating or drinking restrictions. Your health care provider may recommend that you: Eat smaller meals more often. Eat low-fat   foods. Eat low-fiber forms of high-fiber foods. For example, eat cooked vegetables instead  of raw vegetables. Have only liquid foods instead of solid foods. Liquid foods are easier to digest. Drink enough fluid to keep your urine pale yellow. Exercise as often as told by your health care provider. Keep all follow-up visits. This is important. Contact a health care provider if you: Notice that your symptoms do not improve with treatment. Have new symptoms. Get help right away if you: Have severe pain in your abdomen that does not improve with treatment. Have nausea that is severe or does not go away. Vomit every time you drink fluids. Summary Gastroparesis is a long-term (chronic) condition in which food takes longer than normal to empty from the stomach. Symptoms include nausea, vomiting, heartburn, bloating of your abdomen, and loss of appetite. Eating smaller portions, low-fat foods, and low-fiber forms of high-fiber foods may help you manage your symptoms. Get help right away if you have severe pain in your abdomen. This information is not intended to replace advice given to you by your health care provider. Make sure you discuss any questions you have with your healthcare provider. Document Revised: 05/29/2019 Document Reviewed: 05/29/2019 Elsevier Patient Education  2022 Elsevier Inc.  

## 2020-10-24 ENCOUNTER — Emergency Department (HOSPITAL_COMMUNITY)
Admission: EM | Admit: 2020-10-24 | Discharge: 2020-10-24 | Disposition: A | Discharge status: Home / Self Care | Payer: Medicare Other | Attending: Emergency Medicine | Admitting: Emergency Medicine

## 2020-10-24 ENCOUNTER — Other Ambulatory Visit: Payer: Self-pay

## 2020-10-24 ENCOUNTER — Encounter (HOSPITAL_COMMUNITY): Payer: Self-pay | Admitting: Emergency Medicine

## 2020-10-24 DIAGNOSIS — Z992 Dependence on renal dialysis: Secondary | ICD-10-CM | POA: Diagnosis not present

## 2020-10-24 DIAGNOSIS — R1110 Vomiting, unspecified: Secondary | ICD-10-CM | POA: Diagnosis not present

## 2020-10-24 DIAGNOSIS — R109 Unspecified abdominal pain: Secondary | ICD-10-CM | POA: Diagnosis not present

## 2020-10-24 DIAGNOSIS — Z5321 Procedure and treatment not carried out due to patient leaving prior to being seen by health care provider: Secondary | ICD-10-CM | POA: Diagnosis not present

## 2020-10-24 LAB — COMPREHENSIVE METABOLIC PANEL
ALT: 16 U/L (ref 0–44)
AST: 29 U/L (ref 15–41)
Albumin: 4.1 g/dL (ref 3.5–5.0)
Alkaline Phosphatase: 123 U/L (ref 38–126)
Anion gap: 15 (ref 5–15)
BUN: 35 mg/dL — ABNORMAL HIGH (ref 6–20)
CO2: 21 mmol/L — ABNORMAL LOW (ref 22–32)
Calcium: 9.8 mg/dL (ref 8.9–10.3)
Chloride: 94 mmol/L — ABNORMAL LOW (ref 98–111)
Creatinine, Ser: 9.04 mg/dL — ABNORMAL HIGH (ref 0.44–1.00)
GFR, Estimated: 5 mL/min — ABNORMAL LOW (ref >60)
Glucose, Bld: 114 mg/dL — ABNORMAL HIGH (ref 70–99)
Potassium: 4.1 mmol/L (ref 3.5–5.1)
Sodium: 130 mmol/L — ABNORMAL LOW (ref 135–145)
Total Bilirubin: 0.8 mg/dL (ref 0.3–1.2)
Total Protein: 7.2 g/dL (ref 6.5–8.1)

## 2020-10-24 LAB — LIPASE, BLOOD: Lipase: 50 U/L (ref 11–51)

## 2020-10-24 LAB — I-STAT BETA HCG BLOOD, ED (MC, WL, AP ONLY): I-stat hCG, quantitative: <5 m[IU]/mL (ref <5)

## 2020-10-24 LAB — CBC
HCT: 35.3 % — ABNORMAL LOW (ref 36.0–46.0)
Hemoglobin: 11.4 g/dL — ABNORMAL LOW (ref 12.0–15.0)
MCH: 29.8 pg (ref 26.0–34.0)
MCHC: 32.3 g/dL (ref 30.0–36.0)
MCV: 92.2 fL (ref 80.0–100.0)
Platelets: 209 10*3/uL (ref 150–400)
RBC: 3.83 MIL/uL — ABNORMAL LOW (ref 3.87–5.11)
RDW: 20.1 % — ABNORMAL HIGH (ref 11.5–15.5)
WBC: 5.4 10*3/uL (ref 4.0–10.5)
nRBC: 0 % (ref 0.0–0.2)

## 2020-10-24 NOTE — ED Triage Notes (Signed)
Patient coming from home, complaint of pain and emesis since receiving dialysis on Tuesday. Pt normal dialysis schedule Tues, Thurs, Sat. Received full treatment Tuesday.

## 2020-10-24 NOTE — ED Notes (Signed)
Patient stated that she was going to go home and take her prescribed meds. Did not want to wait to be seen.

## 2020-10-25 ENCOUNTER — Telehealth: Payer: Self-pay

## 2020-10-25 NOTE — Telephone Encounter (Signed)
Transition Care Management Unsuccessful Follow-up Telephone Call  Date of discharge and from where:  10/24/2020-[facility]   Attempts:  1st Attempt  Reason for unsuccessful TCM follow-up call:  Left voice message

## 2020-10-28 NOTE — Telephone Encounter (Signed)
Transition Care Management Follow-up Telephone Call Date of discharge and from where: 10/24/2020 from [facility] How have you been since you were released from the hospital? Pt stated that she is feeling some better. Pt stated that she did not stay at the ED because of the wait time. Pt went home and took some OTC medications.  Any questions or concerns? No  Items Reviewed: Did the pt receive and understand the discharge instructions provided? Yes  Medications obtained and verified? Yes  Other? No  Any new allergies since your discharge? No  Dietary orders reviewed? No Do you have support at home? Yes   Functional Questionnaire: (I = Independent and D = Dependent) ADLs: I  Bathing/Dressing- I  Meal Prep- I  Eating- I  Maintaining continence- I  Transferring/Ambulation- I  Managing Meds- I   Follow up appointments reviewed:  PCP Hospital f/u appt confirmed? No   Specialist Hospital f/u appt confirmed? No   Are transportation arrangements needed? No  If their condition worsens, is the pt aware to call PCP or go to the Emergency Dept.? Yes Was the patient provided with contact information for the PCP's office or ED? Yes Was to pt encouraged to call back with questions or concerns? Yes

## 2020-11-09 ENCOUNTER — Other Ambulatory Visit: Payer: Self-pay | Admitting: Nurse Practitioner

## 2020-11-09 DIAGNOSIS — R112 Nausea with vomiting, unspecified: Secondary | ICD-10-CM

## 2020-11-09 DIAGNOSIS — K3184 Gastroparesis: Secondary | ICD-10-CM

## 2020-11-11 IMAGING — CT CT CHEST WITHOUT CONTRAST
2 of 4 series · 14 of 46 positions shown, 16 images · non-contrast
Comparison: CT of the abdomen and pelvis performed 08/09/2017, and
CT of the chest performed 08/10/2017

CLINICAL DATA: Subacute onset of worsening bilateral leg swelling.
Abdominal distension. Personal history of renal transplant.

EXAM:
CT CHEST, ABDOMEN AND PELVIS WITHOUT CONTRAST
TECHNIQUE: Multidetector CT imaging of the chest, abdomen and pelvis was
performed following the standard protocol without IV contrast.

[Series 3: cap without · axial · non-contrast · 0.64mm/px · z∈[+1081,+1561]mm · 11 of 114 slices shown, 13 images]
[im 9/114  soft-tissue]
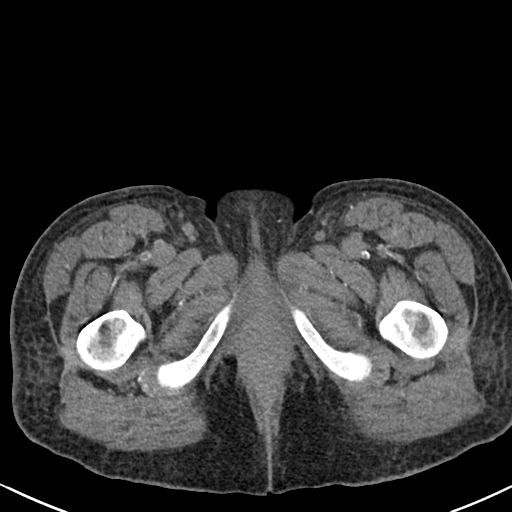
[im 9/114  bone]
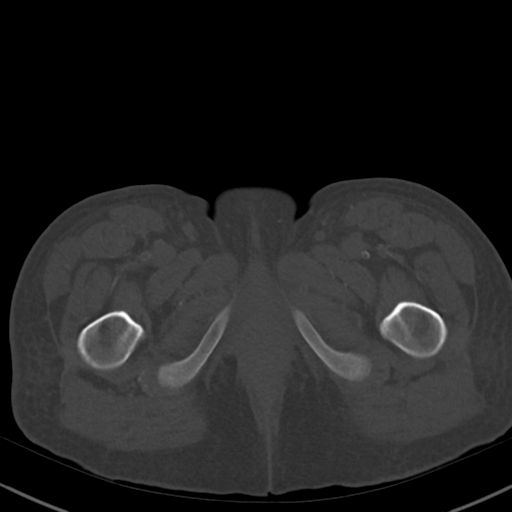
[im 17/114  soft-tissue]
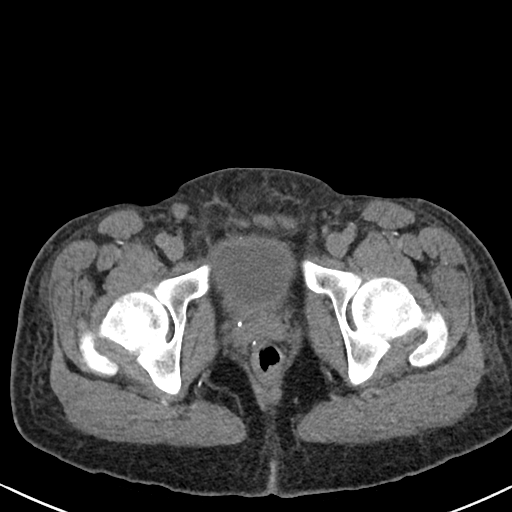
[im 25/114  soft-tissue]
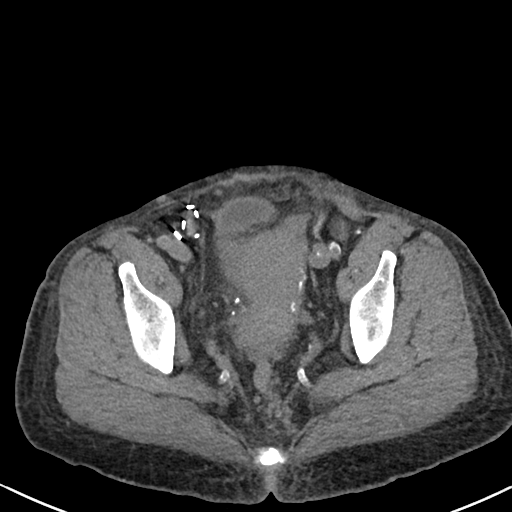
[im 41/114  soft-tissue]
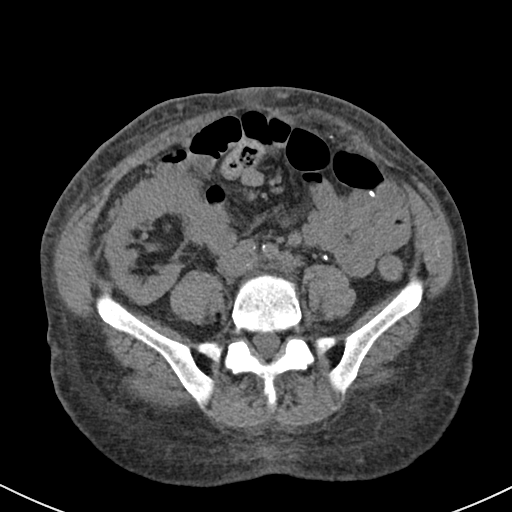
[im 49/114  soft-tissue]
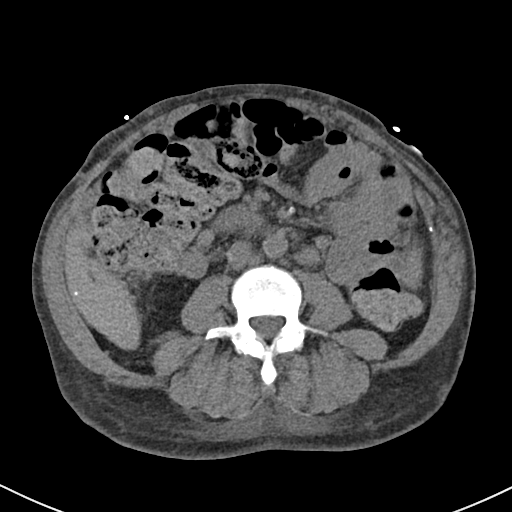
[im 57/114  soft-tissue]
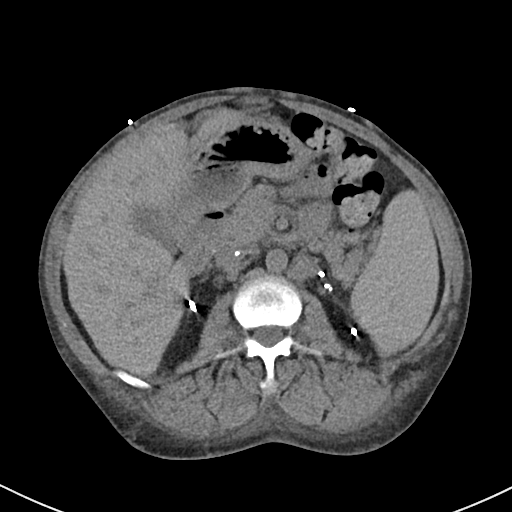
[im 65/114  soft-tissue]
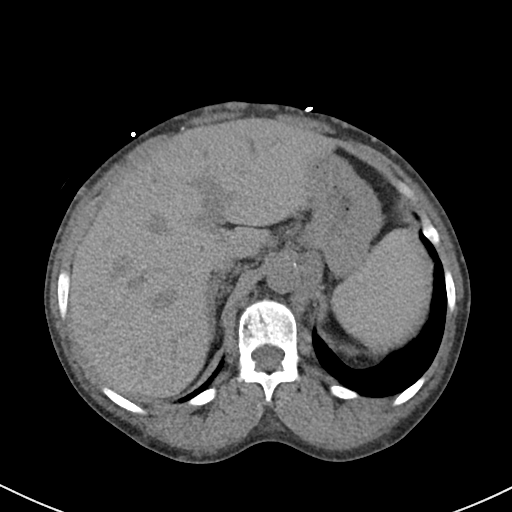
[im 73/114  soft-tissue]
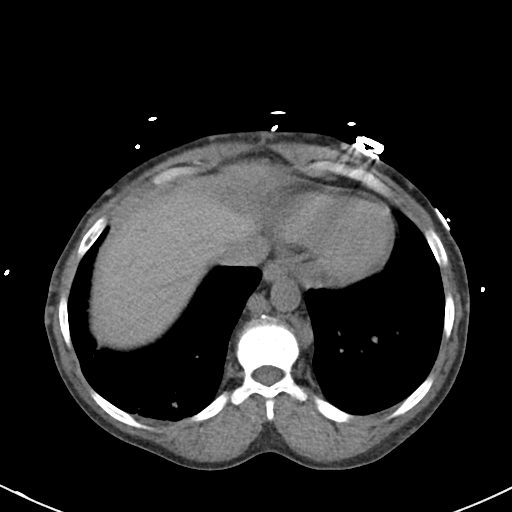
[im 89/114  soft-tissue]
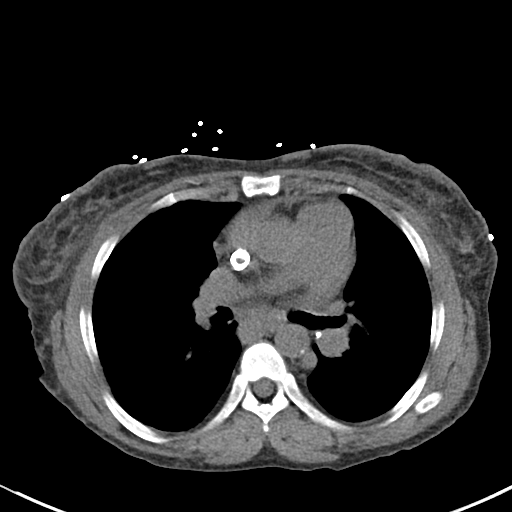
[im 89/114  bone]
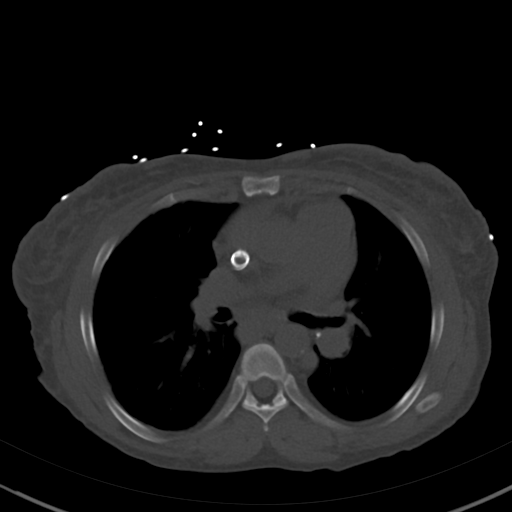
[im 97/114  soft-tissue]
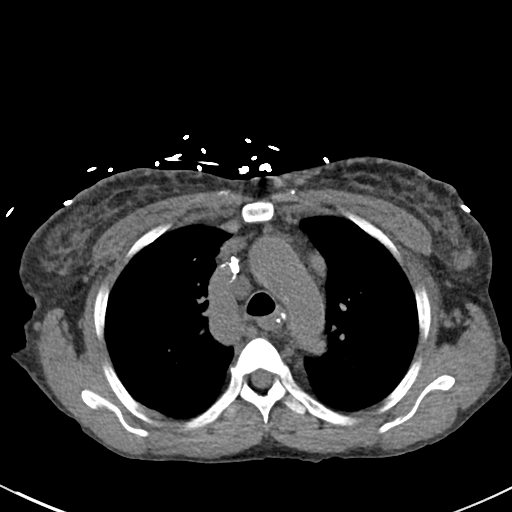
[im 105/114  soft-tissue]
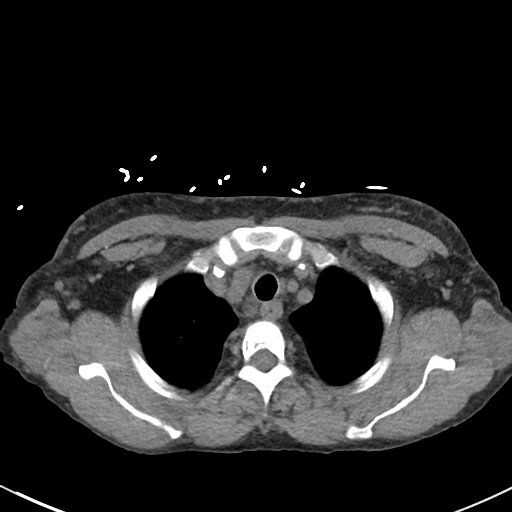

[Series 6: cor · coronal · 0.72mm/px · 3 of 103 slices shown]
[im 35/103  soft-tissue]
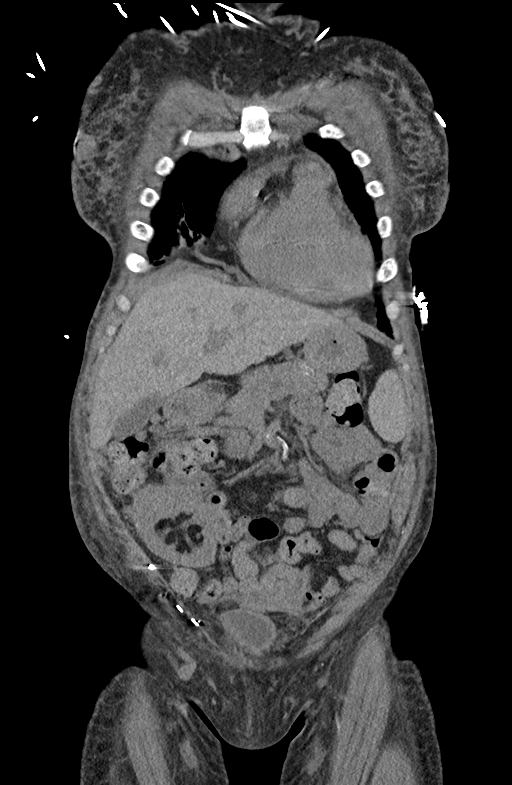
[im 46/103  soft-tissue]
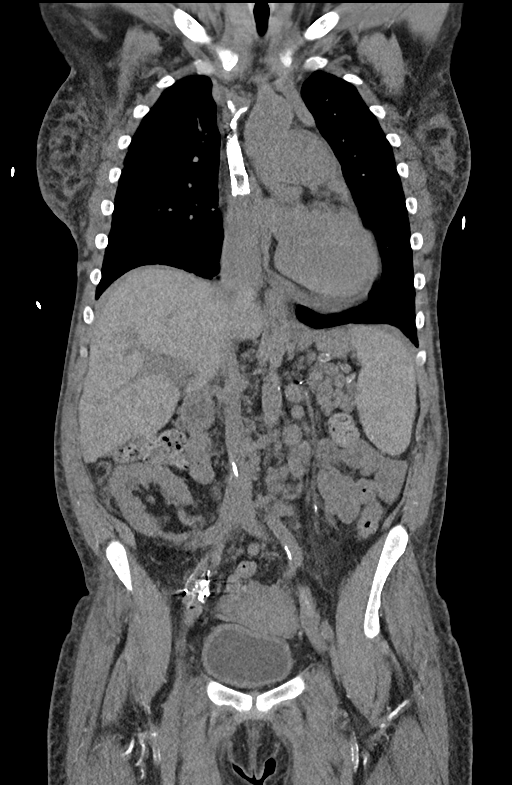
[im 57/103  soft-tissue]
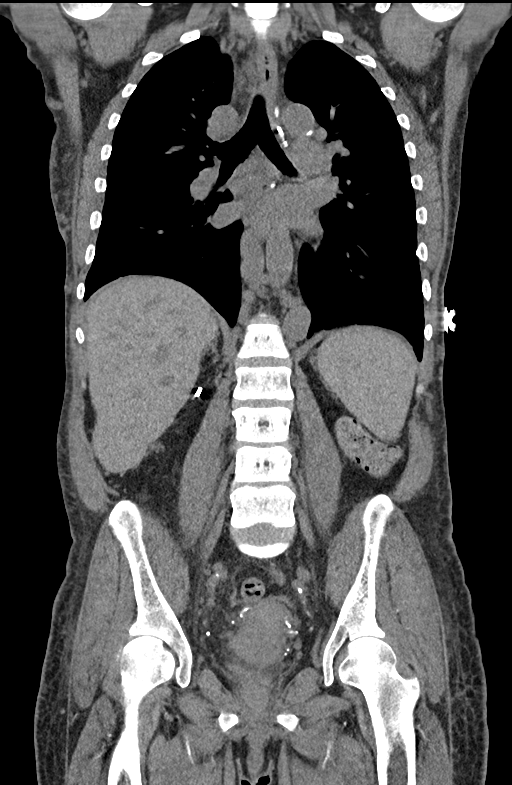

[14 of 46 positions shown; findings below may reference images not displayed]

FINDINGS: CT CHEST FINDINGS

Cardiovascular: The heart is normal in size. Mild coronary artery
calcifications are seen. A vascular stent is noted at the superior
vena cava. Evaluation of the heart is limited without contrast.

Mediastinum/Nodes: A prominent 1.6 cm right paratracheal node is
noted, slightly better characterized than in 6858. No additional
mediastinal lymphadenopathy is seen. No pericardial effusion is
identified. The visualized portions of the thyroid gland are
unremarkable. No axillary lymphadenopathy is appreciated.

Lungs/Pleura: Mild scarring and atelectasis are noted at the right
lung, reflecting interval resolution of airspace opacities from
6858. The lungs are otherwise clear. No pleural effusion or
pneumothorax is seen. No masses are identified.

Musculoskeletal: No acute osseous abnormalities are identified. The
visualized musculature is unremarkable in appearance.

CT ABDOMEN PELVIS FINDINGS

Hepatobiliary: The liver is unremarkable in appearance, with a small
calcified granuloma at the inferior tip of the liver. The
gallbladder is unremarkable in appearance. The common bile duct
remains normal in caliber.

Pancreas: The pancreas is within normal limits.

Spleen: The spleen is unremarkable in appearance.

Adrenals/Urinary Tract: The right adrenal gland is grossly
unremarkable. The patient is status post bilateral nephrectomy. A
transplant kidney is noted at the right iliac fossa, grossly
unremarkable in appearance, with mild perinephric stranding. No
significant hydronephrosis is seen.

Stomach/Bowel: The stomach is unremarkable in appearance. The small
bowel is within normal limits. The appendix is not visualized; there
is no evidence for appendicitis. The colon is unremarkable in
appearance.

Vascular/Lymphatic: Scattered calcification is seen along the
abdominal aorta and its branches. The abdominal aorta is otherwise
grossly unremarkable. The inferior vena cava is grossly
unremarkable. No retroperitoneal lymphadenopathy is seen. No pelvic
sidewall lymphadenopathy is identified.

Reproductive: The bladder is mildly distended and grossly
unremarkable. There is likely a fibroid at the uterine fundus. No
suspicious adnexal masses are seen.

Other: Soft tissue swelling is noted along the anterior chest
abdominal wall, with underlying prominence of superficial venous
vasculature. No significant ascites is seen.

Musculoskeletal: No acute osseous abnormalities are identified. The
visualized musculature is unremarkable in appearance.
IMPRESSION: 1. No evidence of ascites.
2. Soft tissue swelling along the anterior chest and abdominal wall,
reflecting mild superficial edema.
3. 1.6 cm right paratracheal node is slightly better characterized
than in 6858.
4. Transplant kidney is grossly unremarkable in appearance.
5. Likely uterine fibroid noted.

## 2020-11-11 IMAGING — DX CHEST - 2 VIEW
2 series · 2 of 2 positions shown · non-contrast
Comparison: Chest x-rays dated 01/12/2018 and 08/09/2017 and chest
CT dated 08/10/2017

CLINICAL DATA: Bilateral lower extremity edema. Bilateral leg pain.

EXAM:
CHEST - 2 VIEW

[w chest lat]
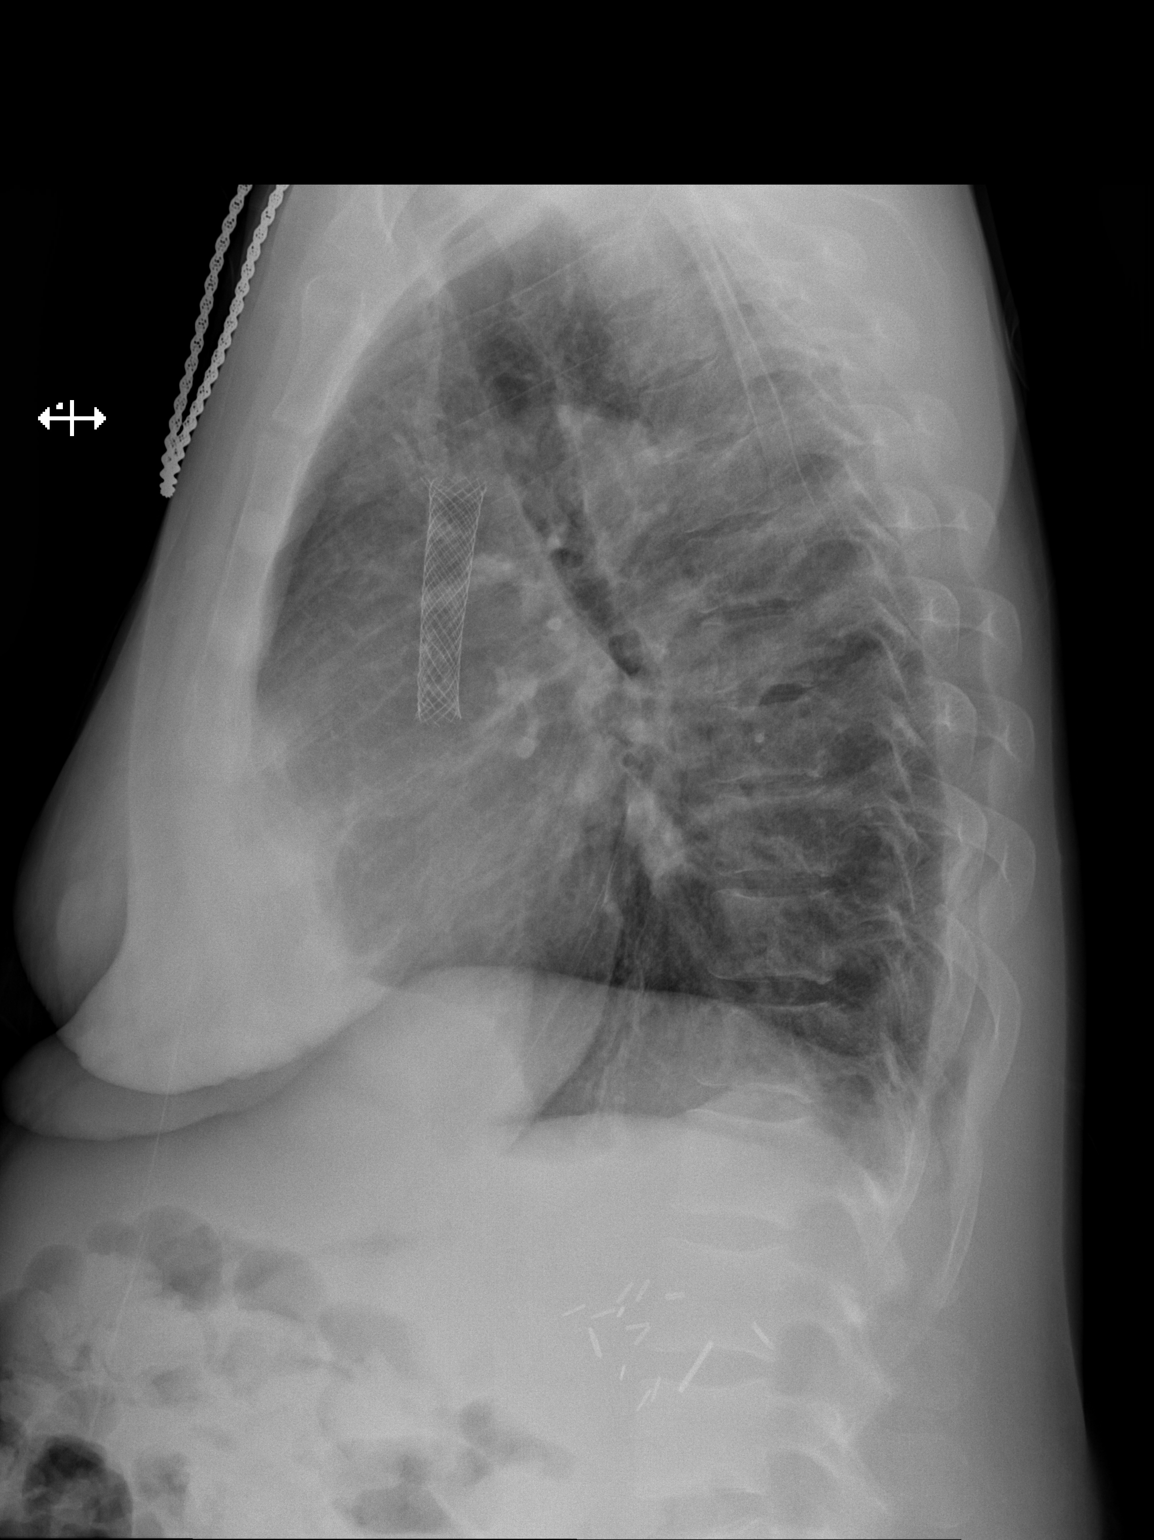

[x chest ap]
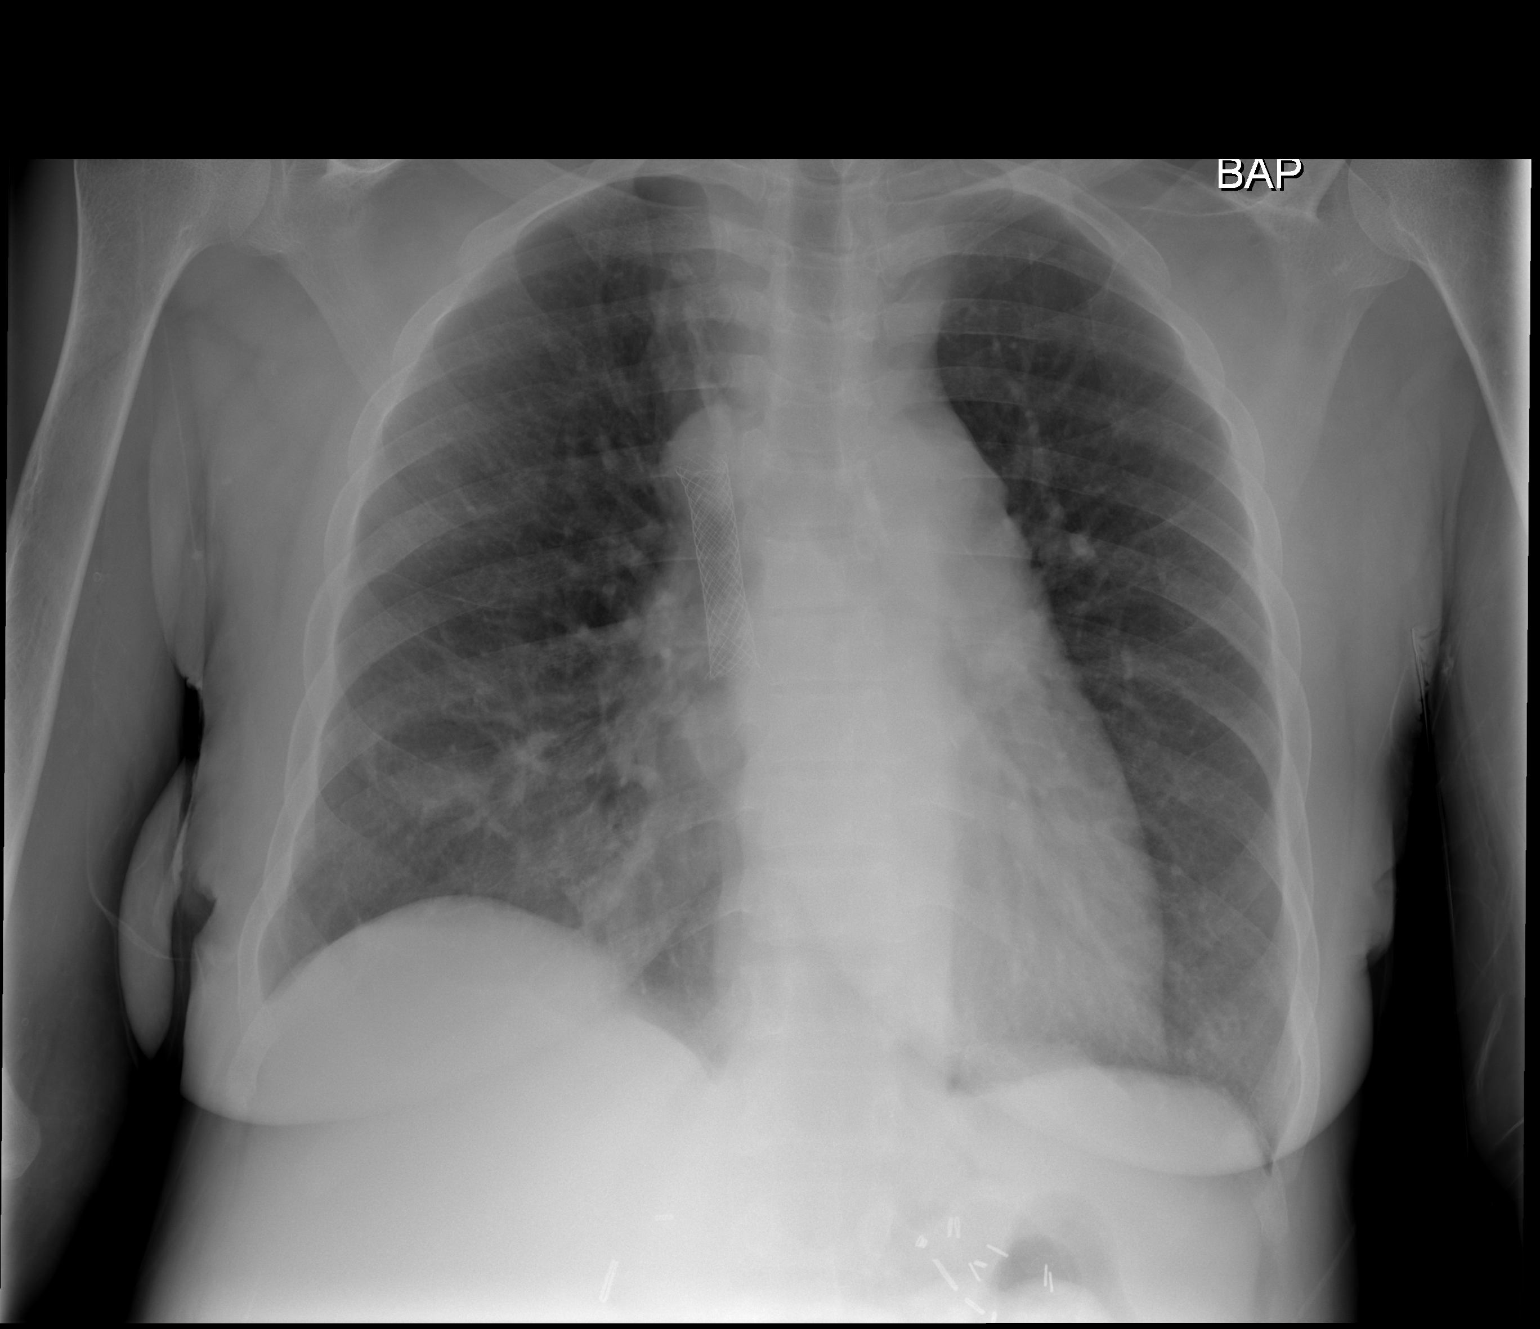

[2 of 2 positions shown; findings below may reference images not displayed]

FINDINGS: Overall heart size is within normal limits. Vascular stent in the
superior vena cava. Chronic distention of the azygos vein. Fullness
of the left side of the superior mediastinum is demonstrated to be
prominent veins on the prior CT scan.

Chronic scarring in the right middle lobe. Lungs are otherwise
clear. No effusions. No acute bone abnormality.
IMPRESSION: No active cardiopulmonary disease.

## 2020-12-22 IMAGING — MG MM DIGITAL SCREENING BILAT W/ CAD
5 series · 5 of 5 positions shown · non-contrast
Comparison: Previous exam(s).

CLINICAL DATA: Screening.

EXAM:
DIGITAL SCREENING BILATERAL MAMMOGRAM WITH CAD

[R CC (1 of 2)]
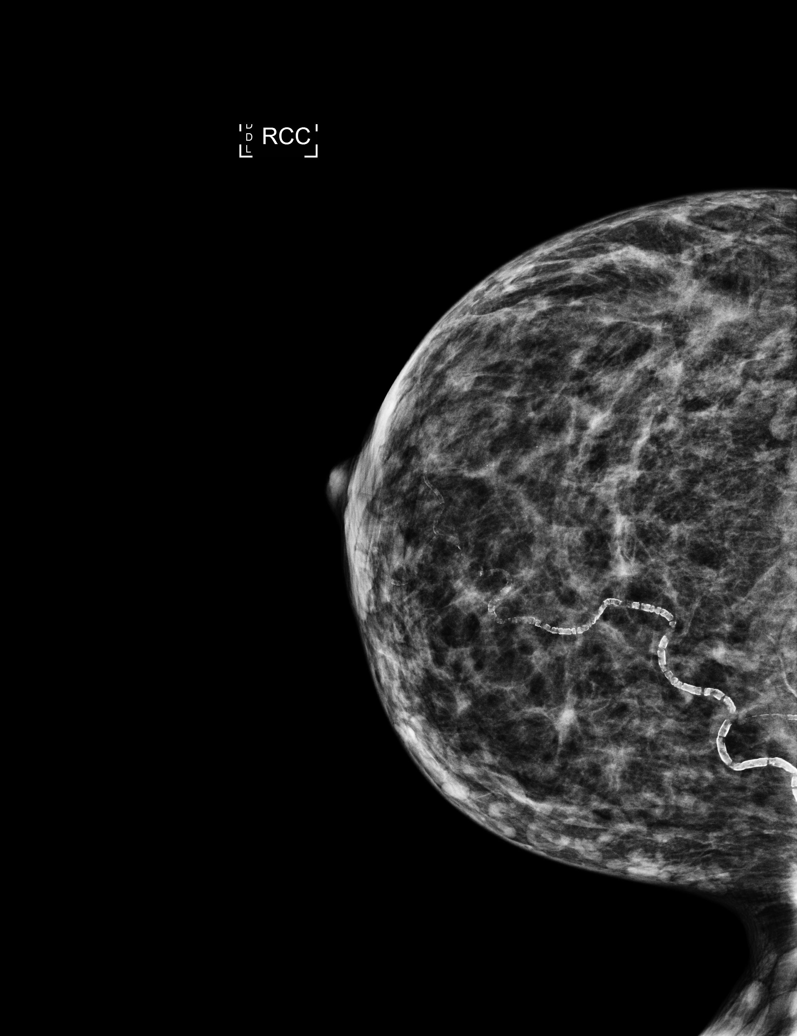

[L CC]
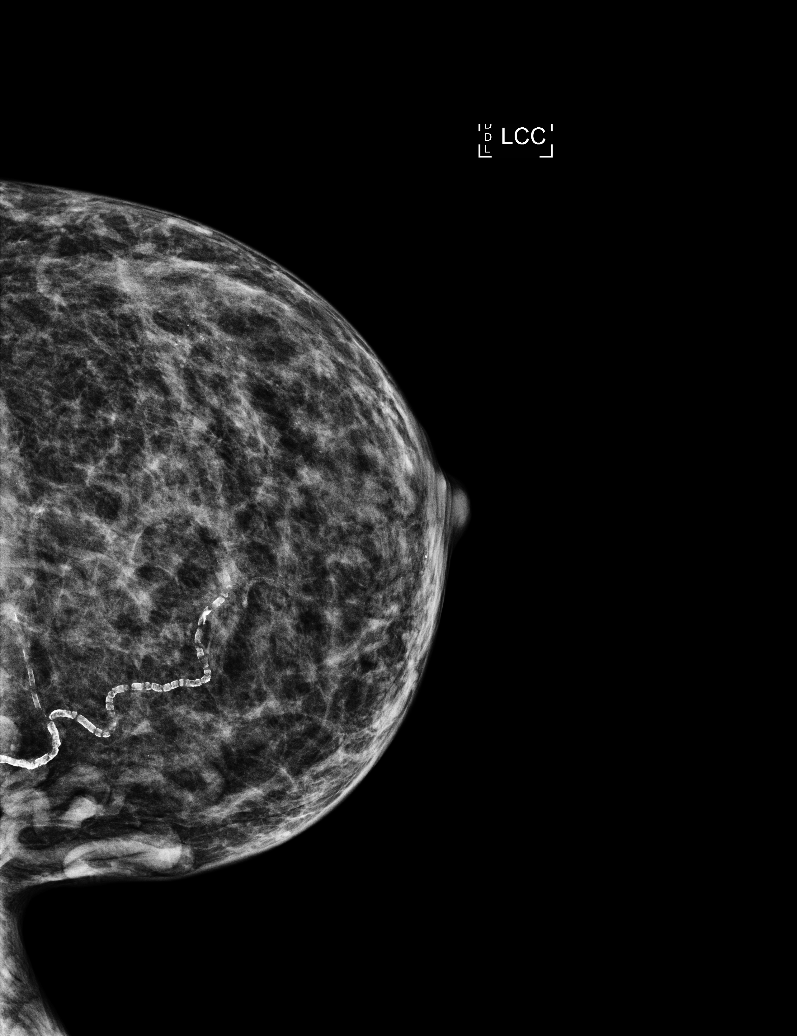

[R MLO]
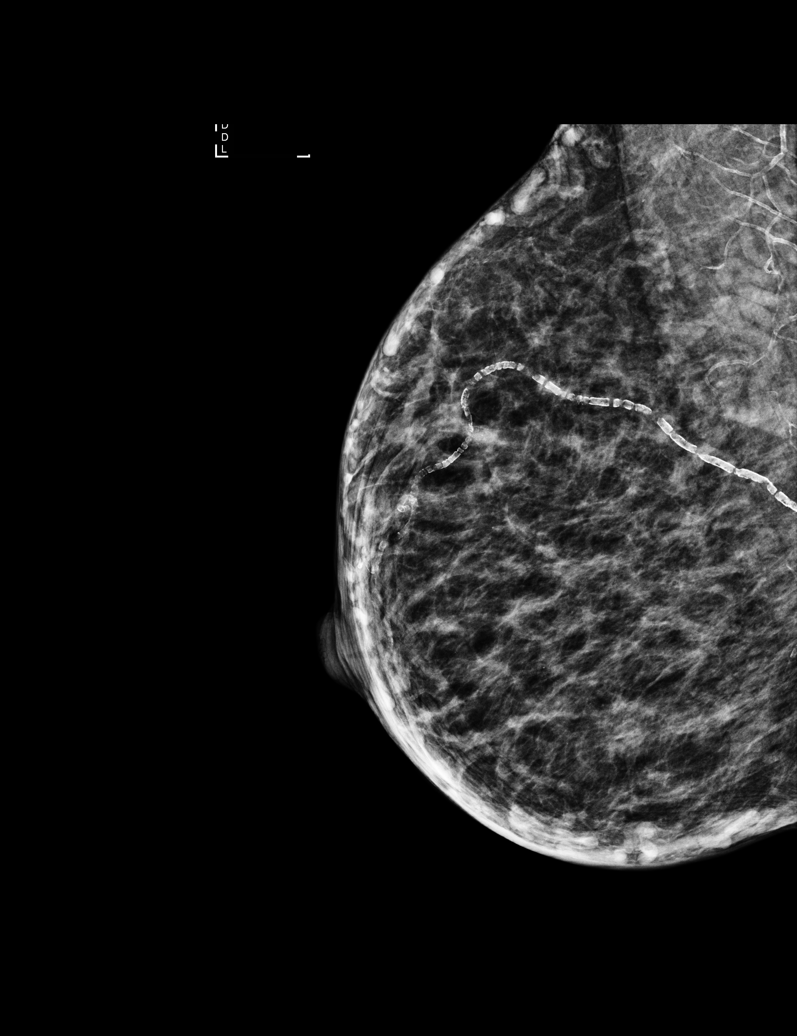

[L MLO]
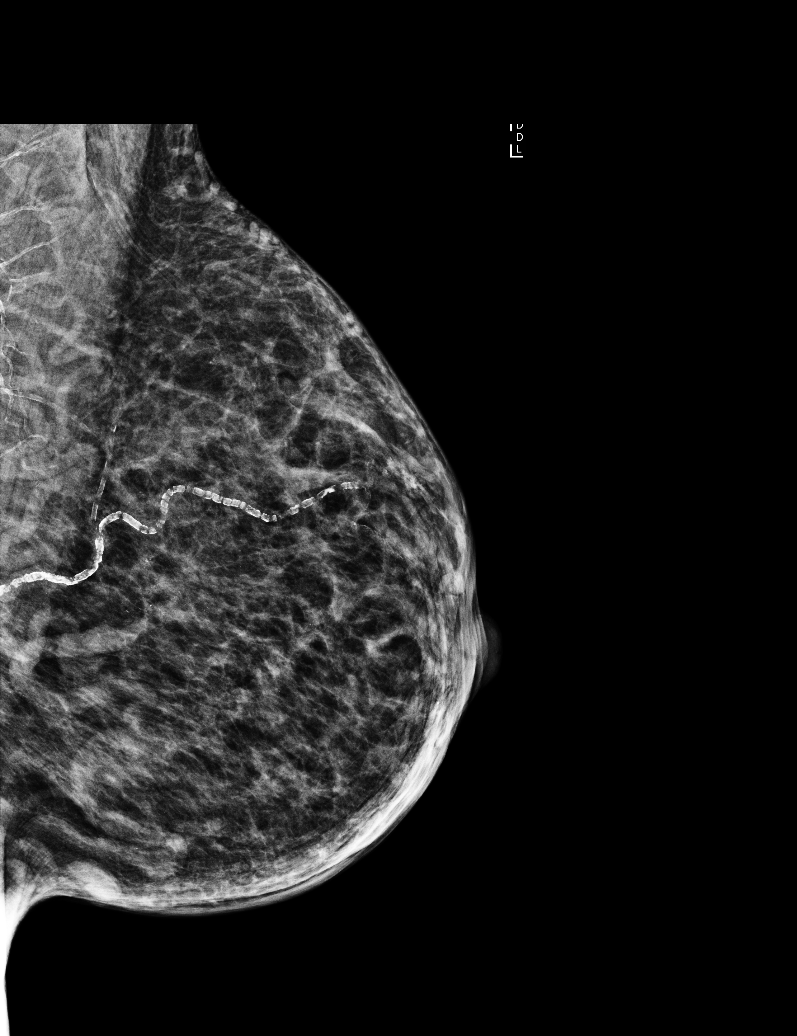

[R CC (2 of 2)]
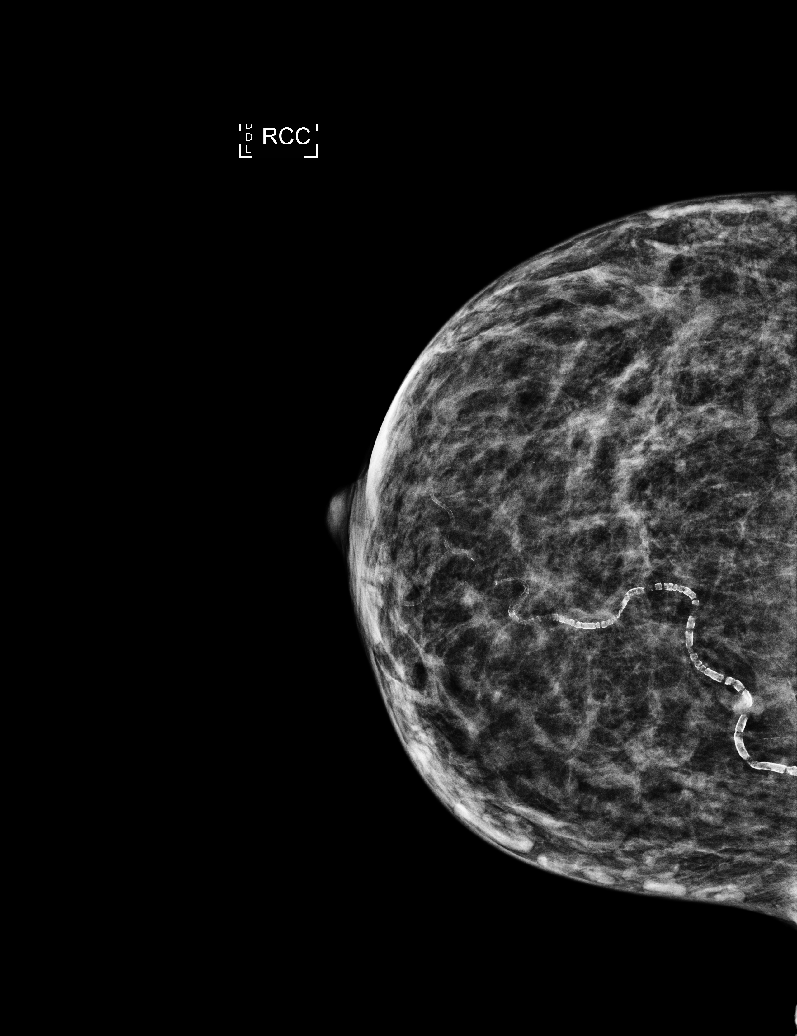

[5 of 5 positions shown; findings below may reference images not displayed]

ACR Breast Density Category c: The breast tissue is heterogeneously
dense, which may obscure small masses.
FINDINGS: There are no findings suspicious for malignancy. Images were
processed with CAD.
IMPRESSION: No mammographic evidence of malignancy. A result letter of this
screening mammogram will be mailed directly to the patient.

RECOMMENDATION:
Screening mammogram in one year. (Code:[id number])

BI-RADS CATEGORY  1: Negative.

## 2021-01-19 ENCOUNTER — Other Ambulatory Visit (HOSPITAL_COMMUNITY): Payer: Self-pay | Admitting: Nurse Practitioner

## 2021-01-23 ENCOUNTER — Ambulatory Visit: Payer: Self-pay | Admitting: Nurse Practitioner

## 2021-01-24 ENCOUNTER — Telehealth (INDEPENDENT_AMBULATORY_CARE_PROVIDER_SITE_OTHER): Payer: Medicare Other | Admitting: Nurse Practitioner

## 2021-01-24 ENCOUNTER — Encounter: Payer: Self-pay | Admitting: Nurse Practitioner

## 2021-01-24 DIAGNOSIS — N186 End stage renal disease: Secondary | ICD-10-CM

## 2021-01-24 DIAGNOSIS — M5030 Other cervical disc degeneration, unspecified cervical region: Secondary | ICD-10-CM

## 2021-01-24 DIAGNOSIS — I5032 Chronic diastolic (congestive) heart failure: Secondary | ICD-10-CM

## 2021-01-24 DIAGNOSIS — M5412 Radiculopathy, cervical region: Secondary | ICD-10-CM | POA: Insufficient documentation

## 2021-01-24 DIAGNOSIS — R110 Nausea: Secondary | ICD-10-CM | POA: Diagnosis not present

## 2021-01-24 DIAGNOSIS — K3184 Gastroparesis: Secondary | ICD-10-CM | POA: Diagnosis not present

## 2021-01-24 DIAGNOSIS — G894 Chronic pain syndrome: Secondary | ICD-10-CM

## 2021-01-24 DIAGNOSIS — G629 Polyneuropathy, unspecified: Secondary | ICD-10-CM

## 2021-01-24 DIAGNOSIS — M1A3720 Chronic gout due to renal impairment, left ankle and foot, without tophus (tophi): Secondary | ICD-10-CM

## 2021-01-24 DIAGNOSIS — Z992 Dependence on renal dialysis: Secondary | ICD-10-CM

## 2021-01-24 MED ORDER — DULOXETINE HCL 30 MG PO CPEP
30.0000 mg | ORAL_CAPSULE | Freq: Every day | ORAL | 1 refills | Status: DC
Start: 2021-01-24 — End: 2021-05-07

## 2021-01-24 MED ORDER — ONDANSETRON 4 MG PO TBDP: 4.0000 mg | ORAL_TABLET | Freq: Three times a day (TID) | ORAL | 11 refills | Status: DC | PRN

## 2021-01-24 NOTE — Patient Instructions (Signed)
Myofascial Pain Syndrome and Fibromyalgia Myofascial pain syndrome and fibromyalgia are both pain disorders. This pain may be felt mainly in your muscles. Myofascial pain syndrome: Always has tender points in the muscle that will cause pain when pressed (trigger points). The pain may come and go. Usually affects your neck, upper back, and shoulder areas. The pain often radiates into your arms and hands. Fibromyalgia: Has muscle pains and tenderness that come and go. Is often associated with fatigue and sleep problems. Has trigger points. Tends to be long-lasting (chronic), but is not life-threatening. Fibromyalgia and myofascial pain syndrome are not the same. However, they often occur together. If you have both conditions, each can make the other worse. Both are common and can cause enough pain and fatigue to make day-to-day activities difficult. Both can be hard to diagnose because their symptoms are common in many other conditions. What are the causes? The exact causes of these conditions are not known. What increases the risk? You are more likely to develop this condition if: You have a family history of the condition. You have certain triggers, such as: Spine disorders. An injury (trauma) or other physical stressors. Being under a lot of stress. Medical conditions such as osteoarthritis, rheumatoid arthritis, or lupus. What are the signs or symptoms? Fibromyalgia The main symptom of fibromyalgia is widespread pain and tenderness in your muscles. Pain is sometimes described as stabbing, shooting, or burning. You may also have: Tingling or numbness. Sleep problems and fatigue. Problems with attention and concentration (fibro fog). Other symptoms may include:  Bowel and bladder problems. Headaches. Visual problems. Problems with odors and noises. Depression or mood changes. Painful menstrual periods (dysmenorrhea). Dry skin or eyes. These symptoms can vary over time. Myofascial  pain syndrome Symptoms of myofascial pain syndrome include: Tight, ropy bands of muscle. Uncomfortable sensations in muscle areas. These may include aching, cramping, burning, numbness, tingling, and weakness. Difficulty moving certain parts of the body freely (poor range of motion). How is this diagnosed? This condition may be diagnosed by your symptoms and medical history. You will also have a physical exam. In general: Fibromyalgia is diagnosed if you have pain, fatigue, and other symptoms for more than 3 months, and symptoms cannot be explained by another condition. Myofascial pain syndrome is diagnosed if you have trigger points in your muscles, and those trigger points are tender and cause pain elsewhere in your body (referred pain). How is this treated? Treatment for these conditions depends on the type that you have. For fibromyalgia: Pain medicines, such as NSAIDs. Medicines for treating depression. Medicines for treating seizures. Medicines that relax the muscles. For myofascial pain: Pain medicines, such as NSAIDs. Cooling and stretching of muscles. Trigger point injections. Sound wave (ultrasound) treatments to stimulate muscles. Treating these conditions often requires a team of health care providers. These may include: Your primary care provider. Physical therapist. Complementary health care providers, such as massage therapists or acupuncturists. Psychiatrist for cognitive behavioral therapy. Follow these instructions at home: Medicines Take over-the-counter and prescription medicines only as told by your health care provider. Do not drive or use heavy machinery while taking prescription pain medicine. If you are taking prescription pain medicine, take actions to prevent or treat constipation. Your health care provider may recommend that you: Drink enough fluid to keep your urine pale yellow. Eat foods that are high in fiber, such as fresh fruits and vegetables, whole  grains, and beans. Limit foods that are high in fat and processed sugars, such as fried   or sweet foods. Take an over-the-counter or prescription medicine for constipation. Lifestyle  Exercise as directed by your health care provider or physical therapist. Practice relaxation techniques to control your stress. You may want to try: Biofeedback. Visual imagery. Hypnosis. Muscle relaxation. Yoga. Meditation. Maintain a healthy lifestyle. This includes eating a healthy diet and getting enough sleep. Do not use any products that contain nicotine or tobacco, such as cigarettes and e-cigarettes. If you need help quitting, ask your health care provider. General instructions Talk to your health care provider about complementary treatments, such as acupuncture or massage. Consider joining a support group with others who are diagnosed with this condition. Do not do activities that stress or strain your muscles. This includes repetitive motions and heavy lifting. Keep all follow-up visits as told by your health care provider. This is important. Where to find more information National Fibromyalgia Association: www.fmaware.org Arthritis Foundation: www.arthritis.org American Chronic Pain Association: www.theacpa.org Contact a health care provider if: You have new symptoms. Your symptoms get worse or your pain is severe. You have side effects from your medicines. You have trouble sleeping. Your condition is causing depression or anxiety. Summary Myofascial pain syndrome and fibromyalgia are pain disorders. Myofascial pain syndrome has tender points in the muscle that will cause pain when pressed (trigger points). Fibromyalgia also has muscle pains and tenderness that come and go, but this condition is often associated with fatigue and sleep disturbances. Fibromyalgia and myofascial pain syndrome are not the same but often occur together, causing pain and fatigue that make day-to-day activities  difficult. Treatment for fibromyalgia includes taking medicines to relax the muscles and medicines for pain, depression, or seizures. Treatment for myofascial pain syndrome includes taking medicines for pain, cooling and stretching of muscles, and injecting medicines into trigger points. Follow your health care provider's instructions for taking medicines and maintaining a healthy lifestyle. This information is not intended to replace advice given to you by your health care provider. Make sure you discuss any questions you have with your health care provider. Document Revised: 05/13/2018 Document Reviewed: 02/03/2017 Elsevier Patient Education  2022 [employer].  

## 2021-01-24 NOTE — Progress Notes (Addendum)
[place], [place]  [address] Phone:  [phone]   Fax:  [phone]  Virtual Visit via Video Note  I connected with Tina Watkins on 01/24/21 at  8:00 AM EST by video and verified that I am speaking with the correct person using two identifiers.   I discussed the limitations, risks, security and privacy concerns of performing an evaluation and management service by video and the availability of in person appointments. I also discussed with the patient that there may be a patient responsible charge related to this service. The patient expressed understanding and agreed to proceed.  Patient home Provider Office  History of Present Illness:  has a past medical history of Bacteremia due to Gram-negative bacteria (05/23/2011), Blind, Blind right eye, CHF (congestive heart failure) ([place]), Chronic lower back pain, Complication of anesthesia, DDD (degenerative disc disease), cervical, Depression, Dysrhythmia, E coli bacteremia (06/18/2011), Elevated LDL cholesterol level (12/2018), ESRD (end stage renal disease) ([place]) (06/12/2011), Fibromyalgia, Gastroparesis, Gastroparesis, GERD (gastroesophageal reflux disease), Glaucoma, Gout, H/O carpal tunnel syndrome, Headache (10/2019), Headache(784.0), Herpes genitalia (1994), History of blood transfusion, History of stomach ulcers, Hypotension, Iron deficiency anemia, New onset a-fib ([place]), Osteopenia, Peripheral neuropathy (11/2018), Pressure ulcer of sacral region, stage 1 (07/2019), Seizures ([place]) (1994), Spinal stenosis in cervical region, Stroke [facility]), Stroke ([place]) (~ 1999; 2001), and Vitamin D deficiency (12/2018).   HPI   She has constant increased pain in the neck and shoulders. The chronic pain is getting worse. She endorses sensitivity to touch. She has severe neuropathy in both feet.  She is currently on gabapentin 100 mg twice daily.She was seen at ortho on 12/02/20. She reports that she was  evaluate and there was a recommendation for referral to rheumatology to further evaluate fibromyalgia.  She has knots in her back. She gets manual and automatic massages.  The massages are effective for the back pain.  She reports hat her walking is like a robot. She rates that pain an 10 at it worse. She had severe reaction to Celebrex.   ROS Denies fever, headache, dizziness, visual changes, congestion, shortness of breath, dyspnea on exertion, chest pain, nausea, vomiting, constipation, diarrhea, edema any change in mental health.   Observations/Objective: Virtual visit no exam   Assessment and Plan: 1. Gastroparesis - ondansetron (ZOFRAN-ODT) 4 MG disintegrating tablet; Take 1 tablet (4 mg total) by mouth every 8 (eight) hours as needed for nausea or vomiting.  Dispense: 20 tablet; Refill: 11  2. Nausea - ondansetron (ZOFRAN-ODT) 4 MG disintegrating tablet; Take 1 tablet (4 mg total) by mouth every 8 (eight) hours as needed for nausea or vomiting.  Dispense: 20 tablet; Refill: 11  3. Chronic diastolic heart failure (HCC) Stable Continue to follow-up with cardiology  4. ESRD (end stage renal disease) on dialysis ([place]) Stable Continue with dialysis Tuesday Thursday Saturday 5. Neuropathy Worsening further evaluation with rheumatology per orthopedic recommendation Started duloxetine 30 mg daily - Ambulatory referral to Rheumatology  6. 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 DDD (degenerative disc disease), cervical Persistent - Ambulatory referral to Rheumatology  8. Chronic pain syndrome Worsening - Ambulatory referral to Rheumatology    Follow Up Instructions: No follow-ups on file.    I discussed the assessment and treatment plan with the patient. The patient was provided an opportunity to ask questions and all were answered. The patient agreed with the plan  and demonstrated  an understanding of the instructions.   The patient was advised to call back or seek an in-person evaluation if the symptoms worsen or if the condition fails to improve as anticipated.  I provided 20 minutes of video- visit time during this encounter.   Vevelyn Francois, NP

## 2021-01-27 ENCOUNTER — Other Ambulatory Visit: Payer: Self-pay | Admitting: Gastroenterology

## 2021-01-27 ENCOUNTER — Other Ambulatory Visit (HOSPITAL_COMMUNITY): Payer: Self-pay | Admitting: Cardiology

## 2021-02-07 ENCOUNTER — Encounter (HOSPITAL_COMMUNITY): Payer: Self-pay | Admitting: *Deleted

## 2021-02-08 ENCOUNTER — Encounter (HOSPITAL_COMMUNITY): Payer: Self-pay

## 2021-02-08 ENCOUNTER — Other Ambulatory Visit: Payer: Self-pay

## 2021-02-08 ENCOUNTER — Emergency Department (HOSPITAL_COMMUNITY)
Admission: EM | Admit: 2021-02-08 | Discharge: 2021-02-08 | Disposition: A | Discharge status: Home / Self Care | Payer: Medicare Other | Attending: Emergency Medicine | Admitting: Emergency Medicine

## 2021-02-08 ENCOUNTER — Emergency Department (HOSPITAL_COMMUNITY): Payer: Medicare Other

## 2021-02-08 DIAGNOSIS — L03113 Cellulitis of right upper limb: Secondary | ICD-10-CM | POA: Insufficient documentation

## 2021-02-08 DIAGNOSIS — Z8616 Personal history of COVID-19: Secondary | ICD-10-CM | POA: Diagnosis not present

## 2021-02-08 DIAGNOSIS — Z7901 Long term (current) use of anticoagulants: Secondary | ICD-10-CM | POA: Insufficient documentation

## 2021-02-08 DIAGNOSIS — R0789 Other chest pain: Secondary | ICD-10-CM | POA: Insufficient documentation

## 2021-02-08 DIAGNOSIS — M791 Myalgia, unspecified site: Secondary | ICD-10-CM | POA: Diagnosis present

## 2021-02-08 DIAGNOSIS — N186 End stage renal disease: Secondary | ICD-10-CM | POA: Insufficient documentation

## 2021-02-08 DIAGNOSIS — I4891 Unspecified atrial fibrillation: Secondary | ICD-10-CM | POA: Diagnosis not present

## 2021-02-08 DIAGNOSIS — R519 Headache, unspecified: Secondary | ICD-10-CM | POA: Insufficient documentation

## 2021-02-08 DIAGNOSIS — Z20822 Contact with and (suspected) exposure to covid-19: Secondary | ICD-10-CM | POA: Insufficient documentation

## 2021-02-08 DIAGNOSIS — Z79899 Other long term (current) drug therapy: Secondary | ICD-10-CM | POA: Diagnosis not present

## 2021-02-08 DIAGNOSIS — Z992 Dependence on renal dialysis: Secondary | ICD-10-CM | POA: Diagnosis not present

## 2021-02-08 DIAGNOSIS — R21 Rash and other nonspecific skin eruption: Secondary | ICD-10-CM | POA: Diagnosis not present

## 2021-02-08 DIAGNOSIS — L03114 Cellulitis of left upper limb: Secondary | ICD-10-CM

## 2021-02-08 DIAGNOSIS — R531 Weakness: Secondary | ICD-10-CM

## 2021-02-08 DIAGNOSIS — T50B95A Adverse effect of other viral vaccines, initial encounter: Secondary | ICD-10-CM

## 2021-02-08 DIAGNOSIS — M79629 Pain in unspecified upper arm: Secondary | ICD-10-CM | POA: Insufficient documentation

## 2021-02-08 LAB — BASIC METABOLIC PANEL
Anion gap: 17 — ABNORMAL HIGH (ref 5–15)
BUN: 29 mg/dL — ABNORMAL HIGH (ref 6–20)
CO2: 21 mmol/L — ABNORMAL LOW (ref 22–32)
Calcium: 9.6 mg/dL (ref 8.9–10.3)
Chloride: 92 mmol/L — ABNORMAL LOW (ref 98–111)
Creatinine, Ser: 6.99 mg/dL — ABNORMAL HIGH (ref 0.44–1.00)
GFR, Estimated: 7 mL/min — ABNORMAL LOW (ref >60)
Glucose, Bld: 79 mg/dL (ref 70–99)
Potassium: 4.5 mmol/L (ref 3.5–5.1)
Sodium: 130 mmol/L — ABNORMAL LOW (ref 135–145)

## 2021-02-08 LAB — CBC
HCT: 40 % (ref 36.0–46.0)
Hemoglobin: 12.1 g/dL (ref 12.0–15.0)
MCH: 28.1 pg (ref 26.0–34.0)
MCHC: 30.3 g/dL (ref 30.0–36.0)
MCV: 92.8 fL (ref 80.0–100.0)
Platelets: 144 10*3/uL — ABNORMAL LOW (ref 150–400)
RBC: 4.31 MIL/uL (ref 3.87–5.11)
RDW: 26.4 % — ABNORMAL HIGH (ref 11.5–15.5)
WBC: 4.6 10*3/uL (ref 4.0–10.5)
nRBC: 0.4 % — ABNORMAL HIGH (ref 0.0–0.2)

## 2021-02-08 LAB — PROTIME-INR
INR: 1 (ref 0.8–1.2)
Prothrombin Time: 13.4 seconds (ref 11.4–15.2)

## 2021-02-08 LAB — RESP PANEL BY RT-PCR (FLU A&B, COVID) ARPGX2
Influenza A by PCR: NEGATIVE
Influenza B by PCR: NEGATIVE
SARS Coronavirus 2 by RT PCR: NEGATIVE

## 2021-02-08 LAB — TROPONIN I (HIGH SENSITIVITY)
Troponin I (High Sensitivity): 13 ng/L (ref <18)
Troponin I (High Sensitivity): 12 ng/L (ref <18)

## 2021-02-08 MED ORDER — FENTANYL CITRATE PF 50 MCG/ML IJ SOSY
75.0000 ug | PREFILLED_SYRINGE | Freq: Once | INTRAMUSCULAR | Status: AC
  Administered 2021-02-08: 75 ug via INTRAMUSCULAR
  Filled 2021-02-08: qty 2

## 2021-02-08 MED ORDER — CEPHALEXIN 500 MG PO CAPS: 500.0000 mg | ORAL_CAPSULE | Freq: Every day | ORAL | 0 refills | Status: AC

## 2021-02-08 MED ORDER — ACETAMINOPHEN 325 MG PO TABS
650.0000 mg | ORAL_TABLET | Freq: Once | ORAL | Status: AC
  Administered 2021-02-08: 650 mg via ORAL
  Filled 2021-02-08: qty 2

## 2021-02-08 NOTE — ED Provider Notes (Signed)
[facility] EMERGENCY DEPARTMENT Provider Note   CSN: [id number] Arrival date & time: 02/08/21  1043     History  Chief Complaint  Patient presents with   Covid-like symptoms after Covid Booster    Tina Watkins is a 56 y.o. female.  HPI     56 year old female with history of ESRD on HD, A. Fib, kidney transplant comes in with multiple complaints, comprising of headache, arm pain, chest pain primarily.  She reports that she received her booster shot on Thursday.  Thursday night she started having some arm pain and chest discomfort.  On Friday she had profound weakness.  Today she started having chills, prompting her to come to the ER.  Patient also felt weak to the point that she could not go to her dialysis as already called the dialysis center, they will try to plug her in on Monday.  Patient's most severe pain is in her arm where she received her injection.  Chest pain is described as tightness and heavy pain that is constant and worse with movement of her chest and arm.   Home Medications Prior to Admission medications   Medication Sig Start Date End Date Taking? Authorizing Provider  albuterol (VENTOLIN HFA) 108 (90 Base) MCG/ACT inhaler Inhale 1-2 puffs into the lungs every 6 (six) hours as needed for wheezing or shortness of breath.   Yes [provider]  allopurinol (ZYLOPRIM) 100 MG tablet Take 1 tablet (100 mg total) by mouth daily. 01/13/20  Yes [name], FNP  amiodarone (PACERONE) 200 MG tablet TAKE 1 TABLET BY MOUTH EVERY DAY Patient taking differently: Take 200 mg by mouth daily. 01/20/21  Yes Sherran Needs, NP  apixaban (ELIQUIS) 2.5 MG TABS tablet Take 1 tablet (2.5 mg total) by mouth 2 (two) times daily. NEEDS APPOINTMENT FOR MORE REFILS 01/28/21  Yes Larey Dresser, MD  B Complex-C-Zn-Folic Acid (DIALYVITE [id number] 15) 0.8 MG TABS Take 1 tablet by mouth daily. 12/30/20  Yes [provider]  busPIRone  (BUSPAR) 10 MG tablet Take 5 mg by mouth 2 (two) times daily. 03/29/20  Yes [provider]  calcium acetate (PHOSLO) 667 MG capsule Take 667 mg by mouth See admin instructions. Take 1 capsule (667 mg) by mouth with each meal and with each snack 07/25/20  Yes [provider]  cephALEXin (KEFLEX) 500 MG capsule Take 1 capsule (500 mg total) by mouth daily for 5 days. 02/08/21 02/13/21 Yes Varney Biles, MD  citalopram (CELEXA) 20 MG tablet Take 20 mg by mouth every evening. 03/29/20  Yes [provider]  Colchicine 0.6 MG CAPS Take 0.6 mg by mouth daily as needed (gout flare up).   Yes [provider]  diclofenac Sodium (VOLTAREN) 1 % GEL Apply 2 g topically 4 (four) times daily as needed (arthritis pain). 01/20/21  Yes [provider]  famotidine (PEPCID) 20 MG tablet TAKE 1 TABLET BY MOUTH TWICE A DAY Patient taking differently: Take 20 mg by mouth 2 (two) times daily. 11/11/20  Yes King, Diona Foley, NP  fluticasone (FLONASE) 50 MCG/ACT nasal spray Place 1 spray into both nostrils daily as needed for allergies or rhinitis.   Yes [provider]  gabapentin (NEURONTIN) 100 MG capsule TAKE 1 CAPSULE BY MOUTH TWICE A DAY MAX 2 CAPSULES DAILY Patient taking differently: Take 100 mg by mouth 2 (two) times daily. 05/22/20  Yes King, Diona Foley, NP  latanoprost (XALATAN) 0.005 % ophthalmic solution Place 1 drop into  the left eye at bedtime. 03/30/17  Yes [provider]  lidocaine (LIDODERM) 5 % Place 1 patch onto the skin daily as needed (for pain- remove old patch first).  10/28/18  Yes [provider]  metoCLOPramide (REGLAN) 10 MG tablet Take 1 tablet (10 mg total) by mouth every 8 (eight) hours as needed for nausea. 10/23/20 10/23/21 Yes Vevelyn Francois, NP  metoCLOPramide (REGLAN) 5 MG tablet Take 1 tablet (5 mg total) by mouth 2 (two) times daily before a meal. 05/09/20  Yes Milus Banister, MD  midodrine (PROAMATINE) 10 MG tablet Take 1.5  tablets (15 mg total) by mouth 3 (three) times daily. Patient taking differently: Take 10 mg by mouth See admin instructions. Take 1 tablet (10 mg) by mouth twice daily Sundays, Monday, Wednesdays & Fridays Take 1 tablet (10 mg) by mouth 3 times daily Tuesdays, Thursdays & Saturdays 03/22/20  Yes Bensimhon, Shaune Pascal, MD  nicotine (NICODERM CQ) 21 mg/24hr patch Place 1 patch (21 mg total) onto the skin daily. Patient taking differently: Place 21 mg onto the skin daily as needed (smoking cessation). 07/19/20 07/19/21 Yes King, Diona Foley, NP  omeprazole (PRILOSEC) 40 MG capsule TAKE 1 CAPSULE (40 MG TOTAL) BY MOUTH DAILY. 01/28/21  Yes Milus Banister, MD  ondansetron (ZOFRAN-ODT) 4 MG disintegrating tablet Take 1 tablet (4 mg total) by mouth every 8 (eight) hours as needed for nausea or vomiting. 01/24/21  Yes Vevelyn Francois, NP  oxyCODONE-acetaminophen (PERCOCET) 10-325 MG tablet Take 1 tablet by mouth 4 (four) times daily as needed for pain. 09/08/19  Yes [provider]  Vitamin D, Ergocalciferol, (DRISDOL) 1.25 MG (50000 UNIT) CAPS capsule TAKE 1 CAPSULE (50,000 UNITS TOTAL) BY MOUTH EVERY 7 (SEVEN) DAYS. Patient taking differently: Take 50,000 Units by mouth every 7 (seven) days. 01/17/20  Yes [name], FNP  zolpidem (AMBIEN) 10 MG tablet Take 1 tablet (10 mg total) by mouth at bedtime as needed for sleep. 08/24/19  Yes [name], Pratik D, DO  diphenhydrAMINE (BENADRYL) 25 mg capsule Take 1 capsule (25 mg total) by mouth every 8 (eight) hours as needed. Patient not taking: Reported on 02/08/2021 07/21/19   [name], FNP  DULoxetine (CYMBALTA) 30 MG capsule Take 1 capsule (30 mg total) by mouth daily. 01/24/21 07/23/21  Vevelyn Francois, NP      Allergies    Levofloxacin and Tape    Review of Systems   Review of Systems  Constitutional:  Positive for activity change.  Respiratory:  Positive for chest tightness.   Cardiovascular:  Positive for chest pain.  Musculoskeletal:   Positive for arthralgias.  Skin:  Positive for rash.   Physical Exam Updated Vital Signs BP (!) 80/52    Pulse 63    Temp 98.1 F (36.7 C)    Resp 17    SpO2 100%  Physical Exam Vitals and nursing note reviewed.  Constitutional:      Appearance: She is well-developed.  HENT:     Head: Atraumatic.  Cardiovascular:     Rate and Rhythm: Normal rate.  Pulmonary:     Effort: Pulmonary effort is normal.  Musculoskeletal:        General: Swelling and deformity present.     Cervical back: Normal range of motion and neck supple.     Comments: Tenderness with palpation over the injection site, there is surrounding erythema and mild edema  Skin:    General: Skin is warm and dry.  Findings: Erythema and rash present.  Neurological:     Mental Status: She is alert and oriented to person, place, and time.    ED Results / Procedures / Treatments   Labs (all labs ordered are listed, but only abnormal results are displayed) Labs Reviewed  BASIC METABOLIC PANEL - Abnormal; Notable for the following components:      Result Value   Sodium 130 (*)    Chloride 92 (*)    CO2 21 (*)    BUN 29 (*)    Creatinine, Ser 6.99 (*)    GFR, Estimated 7 (*)    Anion gap 17 (*)    All other components within normal limits  CBC - Abnormal; Notable for the following components:   RDW 26.4 (*)    Platelets 144 (*)    nRBC 0.4 (*)    All other components within normal limits  RESP PANEL BY RT-PCR (FLU A&B, COVID) [id number]  PROTIME-INR  TROPONIN I (HIGH SENSITIVITY)  TROPONIN I (HIGH SENSITIVITY)    EKG EKG Interpretation  Date/Time:  Saturday February 08 2021 10:49:14 EST Ventricular Rate:  63 PR Interval:  182 QRS Duration: 72 QT Interval:  486 QTC Calculation: 497 R Axis:   45 Text Interpretation: Normal sinus rhythm Possible Lateral infarct , age undetermined Abnormal ECG When compared with ECG of 18-Sep-2020 09:17, PREVIOUS ECG IS PRESENT No significant change since last tracing Confirmed  by Varney Biles ([id number]) on 02/08/2021 11:37:38 AM  Radiology DG Chest 2 View  Result Date: 02/08/2021 CLINICAL DATA:  Left-sided chest pain. Some shortness of breath and weakness. EXAM: CHEST - 2 VIEW COMPARISON:  07/18/2020. FINDINGS: Cardiac silhouette is normal in size. No mediastinal or hilar masses. Superior vena cava stent is stable as are calcifications extending superior to the stent. No mediastinal or hilar masses or evidence of adenopathy. Clear lungs.  No pleural effusion or pneumothorax. Skeletal structures are intact. IMPRESSION: No acute cardiopulmonary disease. Electronically Signed   By: Lajean Manes M.D.   On: 02/08/2021 11:50    Procedures Procedures    Medications Ordered in ED Medications  fentaNYL (SUBLIMAZE) injection 75 mcg (75 mcg Intramuscular Given 02/08/21 1205)  acetaminophen (TYLENOL) tablet 650 mg (650 mg Oral Given 02/08/21 1206)    ED Course/ Medical Decision Making/ A&P Clinical Course as of 02/09/21 1117  Sat Feb 08, 2021  1350 Labs are overall reassuring. Normal white count, baseline abnormality with her renal function and electrolytes. Patient has passed oral challenge. Pain is improved.  Bedside ultrasound revealed cobblestoning, could be cellulitis versus just localized inflammation from the vaccination shot.  Considering patient's comorbidities, we will give her a short course of Keflex.  Patient in agreement with the plan. [AN]  1352 BP(!): 84/58 Patient reassessed at 2 PM and then again now.  She and her husband both indicate that her blood pressure is always low.  She has required medication during dialysis.  With her not having any dizziness, lightheadedness, chest pain, shortness of breath -I do not think any further management is indicated.  She has passed oral challenge.  Labs again reviewed for the second time, appearing to be reassuring. [AN]    Clinical Course User Index [AN] Varney Biles, MD                           Medical Decision  Making 56 year old female comes in with chief complaint of chest pain, arm pain.  She is also  having chills, nausea, weakness, diarrhea.  Patient has history of ESRD on HD, renal transplant, CHF, A. fib.  She also has a history of fibromyalgia and complex pain syndrome.  On exam, it appears that she is having localized reaction to the vaccine or arm.  I think the systemic symptoms include chills, malaise and weakness and likely the nausea and diarrhea.  Chest pain has been left-sided and also constant since the vaccination on Thursday night.  Clinically, low suspicion for ACS given the history alone, however she has too many cardiovascular risk factors to ignore, and we will get troponins to rule out ACS.  I have reviewed patient's prior primary care visits and also cardiovascular work-up such as nuclear stress test during last year.  The nuclear stress was reassuring.  Initial plan is to get basic labs, perform a bedside ultrasound to ensure there is no evidence of abscess and see if there is any evidence consistent with cellulitis which would require antibiotics.  We will also make sure patient does not have any emergent need for hemodialysis.  Amount and/or Complexity of Data Reviewed External Data Reviewed: labs, radiology, ECG and notes. Labs: ordered. Decision-making details documented in ED Course. Radiology: ordered and independent interpretation performed. Decision-making details documented in ED Course. ECG/medicine tests: ordered and independent interpretation performed. Decision-making details documented in ED Course.  Risk OTC drugs. Parenteral controlled substances.          Final Clinical Impression(s) / ED Diagnoses Final diagnoses:  Myalgia after COVID-19 vaccination  Generalized weakness  Cellulitis of left upper extremity    Rx / DC Orders ED Discharge Orders          Ordered    cephALEXin (KEFLEX) 500 MG capsule  Daily        02/08/21 1534               Varney Biles, MD 02/09/21 1117

## 2021-02-08 NOTE — ED Triage Notes (Signed)
Patient complains of left anterior chest and left shoulder with lower back pain x 2 days following covid booster. Patient due for dialysis today but didn't go, also complains of diarrhea

## 2021-02-08 NOTE — ED Notes (Signed)
Pt reports CP, arm pain, back pain, headache, N, D since Thursday when she got her 2nd covid booster. Pt reports SBP 80's at home and that she took her midirone. Pt did not go to dialysis today.

## 2021-02-08 NOTE — ED Notes (Signed)
Reviewed discharge instructions with patient and significant other. Follow-up care and medications reviewed. Patient and significant other verbalized understanding. Patient A&Ox4, VSS, and ambulatory with steady gait upon discharge.

## 2021-02-08 NOTE — Discharge Instructions (Addendum)
All the results in the ER are normal, labs and imaging.  We suspect that you most likely have postvaccination side effects, which include body aches, generalized weakness, chills, localized arm pain.  For your chest pain, we had ordered cardiac enzymes which are normal.  We do think that you have a localized infection to your arm, therefore starting you on antibiotics.  To be sure you are not having a flu or COVID-19, we have sent a swab for them.  Please return to the ER if your symptoms worsen; you have increased pain, fevers, chills, inability to keep any medications down, confusion. Otherwise see the outpatient doctor as requested.

## 2021-02-08 NOTE — ED Provider Triage Note (Signed)
Emergency Medicine Provider Triage Evaluation Note  Tina Watkins , a 56 y.o. female  was evaluated in triage.  Pt complains of chest pain, shortness of breath.  She states that same began Thursday evening after she had the COVID booster.  She states that the pain is on the left side of her chest and has associated left arm pain, however does state this is the arm she got the COVID shot in.  History of A. fib anticoagulated with Eliquis.  She is also a dialysis patient and states she did not go to dialysis this morning given her symptoms.  She also states that she developed diarrhea this morning.  She went to dialysis last Thursday.  Review of Systems  Positive:  Negative: See above  Physical Exam  BP 101/68    Pulse 64    Temp 98.1 F (36.7 C)    Resp 20    SpO2 100%  Gen:   Awake, no distress   Resp:  Normal effort  MSK:   Moves extremities without difficulty Other:    Medical Decision Making  Medically screening exam initiated at 11:16 AM.  Appropriate orders placed.  [name] was informed that the remainder of the evaluation will be completed by another provider, this initial triage assessment does not replace that evaluation, and the importance of remaining in the ED until their evaluation is complete.     Bud Face, PA-C 02/08/21 1119

## 2021-02-10 ENCOUNTER — Telehealth: Payer: Self-pay

## 2021-02-10 NOTE — Telephone Encounter (Signed)
Transition Care Management Follow-up Telephone Call Date of discharge and from where: 02/08/2021-[place]  How have you been since you were released from the hospital? Patient stated she is feeling much better. She will call to schedule her follow up with PCP.  Any questions or concerns? No  Items Reviewed: Did the pt receive and understand the discharge instructions provided? Yes  Medications obtained and verified? Yes  Other? No  Any new allergies since your discharge? No  Dietary orders reviewed? No Do you have support at home? Yes   Home Care and Equipment/Supplies: Were home health services ordered? not applicable If so, what is the name of the agency? N/A  Has the agency set up a time to come to the patient's home? not applicable Were any new equipment or medical supplies ordered?  No What is the name of the medical supply agency? N/A Were you able to get the supplies/equipment? not applicable Do you have any questions related to the use of the equipment or supplies? No  Functional Questionnaire: (I = Independent and D = Dependent) ADLs: I  Bathing/Dressing- I  Meal Prep- I  Eating- I  Maintaining continence- I  Transferring/Ambulation- I  Managing Meds- I  Follow up appointments reviewed:  PCP Hospital f/u appt confirmed? No   Specialist Hospital f/u appt confirmed? No   Are transportation arrangements needed? No  If their condition worsens, is the pt aware to call PCP or go to the Emergency Dept.? Yes Was the patient provided with contact information for the PCP's office or ED? Yes Was to pt encouraged to call back with questions or concerns? Yes

## 2021-02-19 ENCOUNTER — Ambulatory Visit: Payer: Self-pay | Admitting: Nurse Practitioner

## 2021-03-13 ENCOUNTER — Other Ambulatory Visit: Payer: Self-pay | Admitting: Nurse Practitioner

## 2021-03-14 ENCOUNTER — Ambulatory Visit: Payer: Medicare Other | Attending: Family | Admitting: Physical Therapy

## 2021-03-14 ENCOUNTER — Other Ambulatory Visit: Payer: Self-pay

## 2021-03-14 DIAGNOSIS — M542 Cervicalgia: Secondary | ICD-10-CM | POA: Diagnosis present

## 2021-03-14 DIAGNOSIS — M25512 Pain in left shoulder: Secondary | ICD-10-CM | POA: Insufficient documentation

## 2021-03-14 DIAGNOSIS — R2689 Other abnormalities of gait and mobility: Secondary | ICD-10-CM | POA: Insufficient documentation

## 2021-03-14 DIAGNOSIS — R293 Abnormal posture: Secondary | ICD-10-CM | POA: Insufficient documentation

## 2021-03-14 DIAGNOSIS — R252 Cramp and spasm: Secondary | ICD-10-CM | POA: Diagnosis present

## 2021-03-14 DIAGNOSIS — M25511 Pain in right shoulder: Secondary | ICD-10-CM | POA: Diagnosis not present

## 2021-03-14 NOTE — Therapy (Signed)
OUTPATIENT PHYSICAL THERAPY LOWER EXTREMITY EVALUATION   Patient Name: Tina Watkins MRN: [id number] DOB:1965-06-15, 56 y.o., female Today's Date: 03/15/2021   PT End of Session - 03/15/21 1009     Visit Number 1    Date for PT Re-Evaluation 05/10/21    Authorization Type MCR/MCD    PT Start Time 1000    PT Stop Time 1045    PT Time Calculation (min) 45 min             Past Medical History:  Diagnosis Date   Bacteremia due to Gram-negative bacteria 05/23/2011   Blind    right eye   Blind right eye    CHF (congestive heart failure) (HCC)    Chronic lower back pain    Complication of anesthesia    "woke up during OR; I have an extremely high tolerance" (12/11/2011) 1 procedure was graft; the other procedures were procedures that are typically done with sedation.   DDD (degenerative disc disease), cervical    Depression    Dysrhythmia    "tachycardia" (12/11/2011) new onset afib 10/15/14 EKG   E coli bacteremia 06/18/2011   Elevated LDL cholesterol level 12/2018   ESRD (end stage renal disease) ([place]) 06/12/2011   Fibromyalgia    Gastroparesis    Gastroparesis    GERD (gastroesophageal reflux disease)    Glaucoma    right eye   Gout    H/O carpal tunnel syndrome    Headache 10/2019   Headache(784.0)    "not often anymore" (12/11/2011)   Herpes genitalia 1994   History of blood transfusion    "more than a few times" (12/11/2011)   History of stomach ulcers    Hypotension    Iron deficiency anemia    New onset a-fib ([place])    10/15/14 EKG   Osteopenia    Peripheral neuropathy 11/2018   Pressure ulcer of sacral region, stage 1 07/2019   Seizures ([place]) 1994   "post transplant; only have had that one" (12/11/2011)   Spinal stenosis in cervical region    Stroke [facility])     left basal ganglia lacunar infarct; Right frontal lobe lacunar infarct.   Stroke [facility]) ~ 1999; 2001   "briefly lost my vision; lost my right eye" (12/11/2011)   Vitamin D deficiency 12/2018    Past Surgical History:  Procedure Laterality Date   ANTERIOR CERVICAL DECOMP/DISCECTOMY FUSION N/A 01/08/2015   Procedure: Anterior Cervical Three-Four/Four-Five Decompression/Diskectomy/Fusion;  Surgeon: Leeroy Cha, MD;  Location: [place] [facility]URO ORS;  Service: Neurosurgery;  Laterality: N/A;  C3-4 C4-5 Anterior cervical decompression/diskectomy/fusion   APPENDECTOMY  ~ 2004   BIOPSY  09/12/2020   Procedure: BIOPSY;  Surgeon: Milus Banister, MD;  Location: [facility] ENDOSCOPY;  Service: Endoscopy;;   CATARACT EXTRACTION     right eye   COLONOSCOPY     COLONOSCOPY WITH PROPOFOL N/A 09/12/2020   Procedure: COLONOSCOPY WITH PROPOFOL;  Surgeon: Milus Banister, MD;  Location: [facility] ENDOSCOPY;  Service: Endoscopy;  Laterality: N/A;   ENUCLEATION  2001   "right"   ESOPHAGOGASTRODUODENOSCOPY (EGD) WITH PROPOFOL N/A 04/21/2012   Procedure: ESOPHAGOGASTRODUODENOSCOPY (EGD) WITH PROPOFOL;  Surgeon: Milus Banister, MD;  Location: [facility] END[facility];  Service: Endoscopy;  Laterality: N/A;   ESOPHAGOGASTRODUODENOSCOPY (EGD) WITH PROPOFOL N/A 09/12/2020   Procedure: ESOPHAGOGASTRODUODENOSCOPY (EGD) WITH PROPOFOL;  Surgeon: Milus Banister, MD;  Location: [facility];  Service: Endoscopy;  Laterality: N/A;   INSERTION OF DIALYSIS CATHETER  1988   "AV graft LUA & LFA; LUA  worked for 1 day; LFA never worked"   IR FLUORO GUIDE CV LINE RIGHT  07/11/2019   IR FLUORO GUIDE CV LINE RIGHT  10/20/2019   IR FLUORO GUIDE CV LINE RIGHT  05/31/2020   IR PTA VENOUS EXCEPT DIALYSIS CIRCUIT  05/31/2020   IR RADIOLOGY PERIPHERAL GUIDED IV START  07/11/2019   IR US GUIDE VASC ACCESS RIGHT  07/11/2019   IR US GUIDE VASC ACCESS RIGHT  07/11/2019   IR US GUIDE VASC ACCESS RIGHT  07/11/2019   IR VENOCAVAGRAM IVC  05/31/2020   KIDNEY TRANSPLANT  1994; 1999; 2005   "right"   MULTIPLE TOOTH EXTRACTIONS     POLYPECTOMY  09/12/2020   Procedure: POLYPECTOMY;  Surgeon: Milus Banister, MD;  Location: [facility];  Service: Endoscopy;;   RIGHT HEART  CATH N/A 03/23/2019   Procedure: RIGHT HEART CATH;  Surgeon: Jolaine Artist, MD;  Location: [place] CV LAB;  Service: Cardiovascular;  Laterality: N/A;   [place]?; 1994; 2005   Patient Active Problem List   Diagnosis Date Noted   Cervical radiculitis 01/24/2021   Acute respiratory failure with hypoxia ([place]) 10/23/2020   Coagulation defect, unspecified ([place]) 08/01/2020   Aortic atherosclerosis ([place]) 07/10/2020   Secondary hypercoagulable state ([place]) 04/23/2020   Atrial flutter ([place]) 03/29/2020   Chronic pain of both knees 03/13/2020   Chills (without fever) 12/12/2019   Other pancytopenia ([place])    Decubitus ulcer of sacral region, stage 1 08/18/2019   HCAP (healthcare-associated pneumonia) 08/05/2019   Pressure injury of skin 07/24/2019   Chest congestion 07/24/2019   Itching 07/24/2019   Weakness 06/13/2019   Pneumonia due to COVID-19 virus 05/09/2019   Hypotension 05/09/2019   Symptomatic anemia 05/09/2019   CHF (congestive heart failure) ([place]) 05/09/2019   Swollen abdomen 01/04/2019   DDD (degenerative disc disease), cervical 12/06/2018   Neuropathy 12/06/2018   Paresthesia 10/11/2018   Neck pain 10/11/2018   Tobacco abuse 11/20/2017   Right leg swelling 11/20/2017   CKD (chronic kidney disease), stage III ([place]) 11/20/2017   Right leg pain 11/20/2017   Stroke (cerebrum) ([place]) 11/20/2017   GERD (gastroesophageal reflux disease) 11/20/2017   Acute venous embolism and thrombosis of deep vessels of proximal lower extremity, right ([place]) 11/20/2017   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    Thrush, oral    AKI (acute kidney injury) ([place])    Multifocal pneumonia 08/09/2017   ETD (Eustachian tube dysfunction), bilateral 08/03/2017   Acute recurrent pansinusitis 07/21/2017   Chest pain 09/29/2015   Cervical stenosis of spinal canal 01/08/2015   Urinary tract infectious disease    Muscle spasms  of neck 06/15/2014   Bleeding hemorrhoid 06/15/2014   Anemia in chronic kidney disease 06/15/2014   Pyelonephritis, acute 06/13/2014   Sepsis ([place]) 06/13/2014   History of kidney transplant    Abnormal CT scan 09/14/2012   Chronic pain syndrome 04/09/2012   Dehydration, mild 04/09/2012   Gout attack 06/23/2011   Herpes infection 06/23/2011   Anxiety 06/23/2011   E coli bacteremia 06/18/2011   ESRD (end stage renal disease) ([place]) 06/12/2011   UTI (lower urinary tract infection) 06/12/2011   Bacteremia due to Gram-negative bacteria 05/23/2011   History of renal transplantation 05/22/2011   Septic shock(785.52) 05/22/2011   Acute on chronic kidney failure ([place]) 05/22/2011   Gastroparesis 04/24/2008   WEIGHT LOSS 08/24/2007   NAUSEA AND VOMITING 08/24/2007  PCP: Vevelyn Francois, NP  REFERRING PROVIDER: Claudia Desanctis, MD  REFERRING DIAG: deconditioning ,ESRD ,near falls recently and hypotension  THERAPY DIAG:  Right shoulder pain, unspecified chronicity  Left shoulder pain, unspecified chronicity  Other abnormalities of gait and mobility  ONSET DATE: chronic 2021  SUBJECTIVE:                                                                                                                                                                                           Osa Fogarty is a 56 y.o. female who presents to clinic with chief complaint of generalized weakness with particular weakness in bil arms along with bil shoulder pain.  MOI/History of condition:  Pt with history of chronic pain feels that she has increasing weakness in bil arms and body which prevents completion of ADLS.  Denies radicular pain into arms.  Denies clumsiness or dropping objects.  Has pain with OH reaching bil shoulders.  Denies excessive pain in joints of hands and ankles.   Red flags:  Known bil edema in LEs and hands  Pertinent past history:  Hx of stroke, DVT, CHF, Afib, Neuropathy,  neck pain, gout, DDD, ESRD, cervical stenosis, dialysis (T, Th, sat), kidney transplant (failed)  Pain:  Are you having pain? Yes Pain location: bil shoulders NPRS scale:  highest 10/10 current 7/10  Average 8-9/10 best 5/10 Aggravating factors: reaching OH Relieving factors: rest Pain description: intermittent, constant, sharp, and aching Severity: high Irritability: low Stage: Chronic Stability: staying the same 24 hour pattern: no clear pattern   Occupation: NA  Hobbies/Recreation: NA  Assistive Device: NA  Hand Dominance: R  Patient Goals: be able to raise arms above head, be able to do her hair, improve overall endurance   PRECAUTIONS: Other: see pertinent history  WEIGHT BEARING RESTRICTIONS No  FALLS:  Has patient fallen in last 6 months? No, Number of falls: 0  LIVING ENVIRONMENT: Lives with: lives alone Stairs: Yes; External: 7 steps; on right going up  PLOF: Independent with basic ADLs and Needs assistance with homemaking (has aide that comes several times a week)  DIAGNOSTIC FINDINGS:   CT impression:  IMPRESSION: 1. Patient has had surgical removal of native kidneys. There is calcification in the right pelvis consistent with failed renal transplant. A renal transplant in the upper right pelvis appear stable compared to most recent studies without hydronephrosis or perinephric fluid. Urinary bladder appears unremarkable.   2. No bowel obstruction. No abscess in the abdomen or pelvis. No periappendiceal region inflammatory change. By report, patient has had appendectomy.   3. There is a right femoral venous catheter  with tip in the inferior vena cava. There are prominent azygos and hemiazygous venous structures, a stable finding compared to prior studies.   4. Mild retroperitoneal adenopathy, stable. No new lymph node enlargement.   5. Aortic Atherosclerosis (ICD10-I70.0). Extensive pelvic arterial vascular calcification noted.   6.  Calcification in the left adrenal may be indicative of prior inflammation in this area. No adrenal enlargement.   7.  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   OBJECTIVE:    GENERAL OBSERVATION:   Vitals: 95/52 mmHg, 02 98, HR 60; mild edema bil hands with no erythema or warmth   PALPATION: Diffuse pain throughout bil shoulders  UPPER EXTREMITY MMT:  MMT Right 03/15/2021 Left 03/15/2021  Shoulder flexion 4 in available range 4 in available range  Shoulder abduction (C5) 3+* 3+**  Shoulder ER 3+* 3+*  Shoulder IR    Middle trapezius    Lower trapezius    Shoulder extension    Grip strength    Cervical flexion (C1,C2)    Cervical S/B (C3)    Shoulder shrug (C4)    Elbow flexion (C6)    Elbow ext (C7)    Thumb ext (C8)    Finger abd (T1)    Grossly     (Blank rows = not tested, score listed is out of 5 possible points.  N = WNL, D = diminished, C = clear for gross weakness with myotome testing, * = concordant pain with testing)  LE MMT:  MMT Right 03/15/2021 Left 03/15/2021  Hip flexion (L2, L3) 4 4  Knee extension (L3) 4 4  Knee flexion 4 4  Hip abduction 3+ 3+  Hip extension    Hip external rotation    Hip internal rotation    Hip adduction    Ankle dorsiflexion (L4)    Ankle plantarflexion (S1)    Ankle inversion    Ankle eversion    Great Toe ext (L5)    Grossly     (Blank rows = not tested, score listed is out of 5 possible points.  N = WNL, D = diminished, C = clear for gross weakness with myotome testing, * = concordant pain with testing)  UPPER EXTREMITY AROM/PROM:  ROM Right 03/15/2021 Left 03/15/2021  Shoulder flexion 70/90* 70/90*  Shoulder abduction    Shoulder internal rotation    Shoulder external rotation at side 30 30  Functional IR    Functional ER Sub occipital* L ear*  Shoulder extension    Elbow extension    Elbow flexion     (Blank rows = not tested, N = WNL, * = concordant pain with testing)   FUNCTIONAL TESTS:   2 MWT: 145' with 1 standing rest  30'' STS: 12 no UE support  Progressive balance screen:  Feet together: 10'' Semi Tandem: R in rear 10'', L in rear 10'' Tandem: R in rear 10'', L in rear 10'' SLS: R 10'', L 10'' (very difficult)  GAIT: Comments: reduced gait speed and endurance  PATIENT SURVEYS:  FOTO 41 -> 47    TODAY'S TREATMENT: NA   PATIENT EDUCATION:  POC, diagnosis, prognosis, HEP, and outcome measures.  Pt educated via explanation, demonstration, and handout (HEP).  Pt confirms understanding verbally.    ASTERISK SIGNS   Asterisk Signs        2 MWT        Function shoulder ER  HOME EXERCISE PROGRAM: None provided today  ASSESSMENT:  CLINICAL IMPRESSION: Lenzie is a 56 y.o. female who presents to clinic with signs and sxs consistent with bil shoulder pain with concurrent deconditioning.  Complex medical history.  Shoulder pain is consistent with likely articular contribution combined with R/C pathology as well.  Patient presents with pain and impairments/deficits in: shoulder strength and ROM, LE strength, endurance, activity tolerance, gait, balance.  Activity limitations include: walking longer distances, reaching OH, lifting.  Participation limitations include: housework and shopping.  Patient will benefit from skilled therapy to address pain and the listed deficits in order to achieve functional goals, enable safety and independence in completion of daily tasks, and return to PLOF.   REHAB POTENTIAL: Fair chronic; comorbidity  CLINICAL DECISION MAKING: Stable/uncomplicated  EVALUATION COMPLEXITY: Low   GOALS:  SHORT TERM GOALS:  STG Name Target Date Goal status  1 D[place]a will be >75% HEP compliant to improve carryover between sessions and facilitate independent management of condition  Baseline: No HEP 04/05/2021 INITIAL   LONG TERM GOALS:   LTG Name Target Date Goal status  1 Mirna will improve two  minute walk test to 220' feet (MCID 40 ft).  Baseline: 145'  05/10/2021 INITIAL  2 Moxie will improve FOTO score from 41 (baseline) to 47 as a proxy for functional improvement 05/10/2021 INITIAL  3 Amesha will be able to do her hair, not limited by pain  Baseline: limited 05/10/2021 INITIAL  4 Eyonna Dontrice Brooks express confidence with advance HEP to allow for self management of condition 05/10/2021 INITIAL   PLAN: PT FREQUENCY: 1-2x/week  PT DURATION: 8 weeks (Ending 05/10/2021)  PLANNED INTERVENTIONS: Therapeutic exercises, Therapeutic activity, Neuro Muscular re-education, Gait training, Patient/Family education, Joint mobilization, Dry Needling, Electrical stimulation, Spinal mobilization and/or manipulation, Moist heat, Taping, Vasopneumatic device, Ionotophoresis 4mg /ml Dexamethasone, and Manual therapy  PLAN FOR NEXT SESSION: general activity tolerance and strengthening, shoulder/periscapular strengthening   Shearon Balo PT, DPT 03/15/2021, 10:31 AM

## 2021-03-15 ENCOUNTER — Encounter: Payer: Self-pay | Admitting: Physical Therapy

## 2021-03-16 NOTE — Progress Notes (Deleted)
Office Visit Note  Patient: Tina Watkins             Date of Birth: 1965/02/26           MRN: [id number]             PCP: Vevelyn Francois, NP Referring: Vevelyn Francois, NP Visit Date: 03/17/2021 Occupation: @GUAROCC @  Subjective:  No chief complaint on file.   History of Present Illness: Tina Watkins is a 56 y.o. female here for chronic gout on on allopurinol 100 mg daily and colchicine PRN, and chronic pain and fibromyalgia syndrome taking cymbalta 30 mg daily. 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 ***   Labs reviewed 10/2021 Uric acid 7.3   Activities of Daily Living:  Patient reports morning stiffness for *** {minute/hour:19697}.   Patient {ACTIONS;DENIES/REPORTS:21021675::"Denies"} nocturnal pain.  Difficulty dressing/grooming: {ACTIONS;DENIES/REPORTS:21021675::"Denies"} Difficulty climbing stairs: {ACTIONS;DENIES/REPORTS:21021675::"Denies"} Difficulty getting out of chair: {ACTIONS;DENIES/REPORTS:21021675::"Denies"} Difficulty using hands for taps, buttons, cutlery, and/or writing: {ACTIONS;DENIES/REPORTS:21021675::"Denies"}  No Rheumatology ROS completed.   PMFS History:  Patient Active Problem List   Diagnosis Date Noted   Cervical radiculitis 01/24/2021   Acute respiratory failure with hypoxia ([place]) 10/23/2020   Coagulation defect, unspecified ([place]) 08/01/2020   Aortic atherosclerosis ([place]) 07/10/2020   Secondary hypercoagulable state ([place]) 04/23/2020   Atrial flutter ([place]) 03/29/2020   Chronic pain of both knees 03/13/2020   Chills (without fever) 12/12/2019   Other pancytopenia ([place])    Decubitus ulcer of sacral region, stage 1 08/18/2019   HCAP (healthcare-associated pneumonia) 08/05/2019   Pressure injury of skin  07/24/2019   Chest congestion 07/24/2019   Itching 07/24/2019   Weakness 06/13/2019   Pneumonia due to COVID-19 virus 05/09/2019   Hypotension 05/09/2019   Symptomatic anemia 05/09/2019   CHF (congestive heart failure) ([place]) 05/09/2019   Swollen abdomen 01/04/2019   DDD (degenerative disc disease), cervical 12/06/2018   Neuropathy 12/06/2018   Paresthesia 10/11/2018   Neck pain 10/11/2018   Tobacco abuse 11/20/2017   Right leg swelling 11/20/2017   CKD (chronic kidney disease), stage III ([place]) 11/20/2017   Right leg pain 11/20/2017   Stroke (cerebrum) ([place]) 11/20/2017   GERD (gastroesophageal reflux disease) 11/20/2017   Acute venous embolism and thrombosis of deep vessels of proximal lower extremity, right ([place]) 11/20/2017   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    Thrush, oral    AKI (acute kidney injury) ([place])    Multifocal pneumonia 08/09/2017   ETD (Eustachian tube dysfunction), bilateral 08/03/2017   Acute recurrent pansinusitis 07/21/2017   Chest pain 09/29/2015   Cervical stenosis of spinal canal 01/08/2015   Urinary tract infectious disease    Muscle spasms of neck 06/15/2014   Bleeding hemorrhoid 06/15/2014   Anemia in chronic kidney disease 06/15/2014   Pyelonephritis, acute 06/13/2014   Sepsis ([place]) 06/13/2014   History of kidney transplant    Abnormal CT scan 09/14/2012   Chronic pain syndrome 04/09/2012   Dehydration, mild 04/09/2012   Gout attack 06/23/2011   Herpes infection 06/23/2011   Anxiety 06/23/2011   E coli bacteremia 06/18/2011   ESRD (end stage renal disease) ([place]) 06/12/2011   UTI (lower urinary tract infection) 06/12/2011   Bacteremia due to Gram-negative bacteria 05/23/2011   History of renal transplantation 05/22/2011  Septic shock(785.52) 05/22/2011   Acute on chronic kidney failure ([place]) 05/22/2011   Gastroparesis 04/24/2008   WEIGHT LOSS 08/24/2007   NAUSEA AND VOMITING 08/24/2007    Past  Medical History:  Diagnosis Date   Bacteremia due to Gram-negative bacteria 05/23/2011   Blind    right eye   Blind right eye    CHF (congestive heart failure) ([place])    Chronic lower back pain    Complication of anesthesia    "woke up during OR; I have an extremely high tolerance" (12/11/2011) 1 procedure was graft; the other procedures were procedures that are typically done with sedation.   DDD (degenerative disc disease), cervical    Depression    Dysrhythmia    "tachycardia" (12/11/2011) new onset afib 10/15/14 EKG   E coli bacteremia 06/18/2011   Elevated LDL cholesterol level 12/2018   ESRD (end stage renal disease) ([place]) 06/12/2011   Fibromyalgia    Gastroparesis    Gastroparesis    GERD (gastroesophageal reflux disease)    Glaucoma    right eye   Gout    H/O carpal tunnel syndrome    Headache 10/2019   Headache(784.0)    "not often anymore" (12/11/2011)   Herpes genitalia 1994   History of blood transfusion    "more than a few times" (12/11/2011)   History of stomach ulcers    Hypotension    Iron deficiency anemia    New onset a-fib ([place])    10/15/14 EKG   Osteopenia    Peripheral neuropathy 11/2018   Pressure ulcer of sacral region, stage 1 07/2019   Seizures ([place]) 1994   "post transplant; only have had that one" (12/11/2011)   Spinal stenosis in cervical region    Stroke [facility])     left basal ganglia lacunar infarct; Right frontal lobe lacunar infarct.   Stroke [facility]) ~ 1999; 2001   "briefly lost my vision; lost my right eye" (12/11/2011)   Vitamin D deficiency 12/2018    Family History  Problem Relation Age of Onset   Glaucoma Mother    Pancreatic cancer Father    Multiple sclerosis Brother    Hypertension Maternal Grandmother    Breast cancer Neg Hx    Past Surgical History:  Procedure Laterality Date   ANTERIOR CERVICAL DECOMP/DISCECTOMY FUSION N/A 01/08/2015   Procedure: Anterior Cervical Three-Four/Four-Five Decompression/Diskectomy/Fusion;  Surgeon:  Leeroy Cha, MD;  [facility]: [facility];  Service: Neurosurgery;  Laterality: N/A;  C3-4 C4-5 Anterior cervical decompression/diskectomy/fusion   APPENDECTOMY  ~ 2004   BIOPSY  09/12/2020   Procedure: BIOPSY;  Surgeon: Milus Banister, MD;  Location: [facility];  Service: Endoscopy;;   CATARACT EXTRACTION     right eye   COLONOSCOPY     COLONOSCOPY WITH PROPOFOL N/A 09/12/2020   Procedure: COLONOSCOPY WITH PROPOFOL;  Surgeon: Milus Banister, MD;  [facility];  Service: Endoscopy;  Laterality: N/A;   ENUCLEATION  2001   "right"   ESOPHAGOGASTRODUODENOSCOPY (EGD) WITH PROPOFOL N/A 04/21/2012   Procedure: ESOPHAGOGASTRODUODENOSCOPY (EGD) WITH PROPOFOL;  Surgeon: Milus Banister, MD;  [facility];  Service: Endoscopy;  Laterality: N/A;   ESOPHAGOGASTRODUODENOSCOPY (EGD) WITH PROPOFOL N/A 09/12/2020   Procedure: ESOPHAGOGASTRODUODENOSCOPY (EGD) WITH PROPOFOL;  Surgeon: Milus Banister, MD;  [facility];  Service: Endoscopy;  Laterality: N/A;   INSERTION OF DIALYSIS CATHETER  1988   "AV graft LUA & LFA; LUA worked for 1 day; LFA never worked"   IR FLUORO GUIDE CV LINE RIGHT  07/11/2019   IR FLUORO GUIDE CV LINE RIGHT  10/20/2019   IR FLUORO GUIDE CV LINE RIGHT  05/31/2020   IR PTA VENOUS EXCEPT DIALYSIS CIRCUIT  05/31/2020   IR RADIOLOGY PERIPHERAL GUIDED IV START  07/11/2019   IR US GUIDE VASC ACCESS RIGHT  07/11/2019   IR US GUIDE VASC ACCESS RIGHT  07/11/2019   IR US GUIDE VASC ACCESS RIGHT  07/11/2019   IR VENOCAVAGRAM IVC  05/31/2020   KIDNEY TRANSPLANT  1994; 1999; 2005   "right"   MULTIPLE TOOTH EXTRACTIONS     POLYPECTOMY  09/12/2020   Procedure: POLYPECTOMY;  Surgeon: Milus Banister, MD;  [facility];  Service: Endoscopy;;   RIGHT HEART CATH N/A 03/23/2019   Procedure: RIGHT HEART CATH;  Surgeon: Jolaine Artist, MD;  Location: [place] CV LAB;  Service: Cardiovascular;  Laterality: N/A;   [place]?;  1994; 2005   Social History   Social History Narrative   Right-handed.   Four cups caffeine per day.   Lives alone.   Immunization History  Administered Date(s) Administered   Hepatitis B, ped/adol 07/20/2019, 08/29/2019, 09/21/2019   Hepb-cpg 07/20/2019, 08/29/2019, 09/21/2019, 11/23/2019   Influenza Split 11/07/2019   Influenza,inj,Quad PF,6+ Mos 11/07/2019   PFIZER Comirnaty(Gray Top)Covid-19 Tri-Sucrose Vaccine 07/29/2020   PFIZER(Purple Top)SARS-COV-2 Vaccination 05/31/2019, 06/21/2019, 10/26/2019   PPD Test 08/10/2017   Pfizer Covid-19 Vaccine Bivalent Booster 54yrs & up 02/06/2021     Objective: Vital Signs: There were no vitals taken for this visit.   Physical Exam   Musculoskeletal Exam: ***  CDAI Exam: CDAI Score: -- Patient Global: --; Provider Global: -- Swollen: --; Tender: -- Joint Exam 03/17/2021   No joint exam has been documented for this visit   There is currently no information documented on the homunculus. Go to the Rheumatology activity and complete the homunculus joint exam.  Investigation: No additional findings.  Imaging: No results found.  Recent Labs: Lab Results  Component Value Date   WBC 4.6 02/08/2021   HGB 12.1 02/08/2021   PLT 144 (L) 02/08/2021   NA 130 (L) 02/08/2021   K 4.5 02/08/2021   CL 92 (L) 02/08/2021   CO2 21 (L) 02/08/2021   GLUCOSE 79 02/08/2021   BUN 29 (H) 02/08/2021   CREATININE 6.99 (H) 02/08/2021   BILITOT 0.8 10/24/2020   ALKPHOS 123 10/24/2020   AST 29 10/24/2020   ALT 16 10/24/2020   PROT 7.2 10/24/2020   ALBUMIN 4.1 10/24/2020   CALCIUM 9.6 02/08/2021   GFRAA 13 (L) 10/13/2019    Speciality Comments: No specialty comments available.  Procedures:  No procedures performed Allergies: Levofloxacin and Tape   Assessment / Plan:     Visit Diagnoses: No diagnosis found.  Orders: No orders of the defined types were placed in this encounter.  No orders of the defined types were placed in this  encounter.   Face-to-face time spent with patient was *** minutes. Greater than 50% of time was spent in counseling and coordination of care.  Follow-Up Instructions: No follow-ups on file.   Collier Salina, MD  Note - This record has been created using [employer].  Chart creation errors have been sought, but may not always  have been located. Such creation errors do not reflect on  the standard of medical care.

## 2021-03-17 ENCOUNTER — Ambulatory Visit: Payer: Medicare Other | Admitting: Internal Medicine

## 2021-03-21 ENCOUNTER — Ambulatory Visit: Payer: Medicare Other

## 2021-03-21 ENCOUNTER — Other Ambulatory Visit: Payer: Self-pay

## 2021-03-21 DIAGNOSIS — R293 Abnormal posture: Secondary | ICD-10-CM

## 2021-03-21 DIAGNOSIS — M25512 Pain in left shoulder: Secondary | ICD-10-CM

## 2021-03-21 DIAGNOSIS — R2689 Other abnormalities of gait and mobility: Secondary | ICD-10-CM

## 2021-03-21 DIAGNOSIS — M542 Cervicalgia: Secondary | ICD-10-CM

## 2021-03-21 DIAGNOSIS — R252 Cramp and spasm: Secondary | ICD-10-CM

## 2021-03-21 DIAGNOSIS — M25511 Pain in right shoulder: Secondary | ICD-10-CM | POA: Diagnosis not present

## 2021-03-21 NOTE — Therapy (Signed)
OUTPATIENT PHYSICAL THERAPY TREATMENT NOTE   Patient Name: Tina Watkins MRN: [id number] DOB:08/12/1965, 56 y.o., female Today's Date: 03/21/2021  PCP: Vevelyn Francois, NP REFERRING PROVIDER: Claudia Desanctis, MD   PT End of Session - 03/21/21 1340     Visit Number 2    Date for PT Re-Evaluation 05/10/21    Authorization Type MCR/MCD    PT Start Time 1345    PT Stop Time 1430    PT Time Calculation (min) 45 min    Activity Tolerance Patient tolerated treatment well    Behavior During Therapy [facility] for tasks assessed/performed             Past Medical History:  Diagnosis Date   Bacteremia due to Gram-negative bacteria 05/23/2011   Blind    right eye   Blind right eye    CHF (congestive heart failure) (HCC)    Chronic lower back pain    Complication of anesthesia    "woke up during OR; I have an extremely high tolerance" (12/11/2011) 1 procedure was graft; the other procedures were procedures that are typically done with sedation.   DDD (degenerative disc disease), cervical    Depression    Dysrhythmia    "tachycardia" (12/11/2011) new onset afib 10/15/14 EKG   E coli bacteremia 06/18/2011   Elevated LDL cholesterol level 12/2018   ESRD (end stage renal disease) ([place]) 06/12/2011   Fibromyalgia    Gastroparesis    Gastroparesis    GERD (gastroesophageal reflux disease)    Glaucoma    right eye   Gout    H/O carpal tunnel syndrome    Headache 10/2019   Headache(784.0)    "not often anymore" (12/11/2011)   Herpes genitalia 1994   History of blood transfusion    "more than a few times" (12/11/2011)   History of stomach ulcers    Hypotension    Iron deficiency anemia    New onset a-fib ([place])    10/15/14 EKG   Osteopenia    Peripheral neuropathy 11/2018   Pressure ulcer of sacral region, stage 1 07/2019   Seizures ([place]) 1994   "post transplant; only have had that one" (12/11/2011)   Spinal stenosis in cervical region    Stroke [facility])     left basal ganglia  lacunar infarct; Right frontal lobe lacunar infarct.   Stroke [facility]) ~ 1999; 2001   "briefly lost my vision; lost my right eye" (12/11/2011)   Vitamin D deficiency 12/2018   Past Surgical History:  Procedure Laterality Date   ANTERIOR CERVICAL DECOMP/DISCECTOMY FUSION N/A 01/08/2015   Procedure: Anterior Cervical Three-Four/Four-Five Decompression/Diskectomy/Fusion;  Surgeon: Leeroy Cha, MD;  Location: [place] NEURO ORS;  Service: Neurosurgery;  Laterality: N/A;  C3-4 C4-5 Anterior cervical decompression/diskectomy/fusion   APPENDECTOMY  ~ 2004   BIOPSY  09/12/2020   Procedure: BIOPSY;  Surgeon: Milus Banister, MD;  [facility];  Service: Endoscopy;;   CATARACT EXTRACTION     right eye   COLONOSCOPY     COLONOSCOPY WITH PROPOFOL N/A 09/12/2020   Procedure: COLONOSCOPY WITH PROPOFOL;  Surgeon: Milus Banister, MD;  [facility];  Service: Endoscopy;  Laterality: N/A;   ENUCLEATION  2001   "right"   ESOPHAGOGASTRODUODENOSCOPY (EGD) WITH PROPOFOL N/A 04/21/2012   Procedure: ESOPHAGOGASTRODUODENOSCOPY (EGD) WITH PROPOFOL;  Surgeon: Milus Banister, MD;  [facility];  Service: Endoscopy;  Laterality: N/A;   ESOPHAGOGASTRODUODENOSCOPY (EGD) WITH PROPOFOL N/A 09/12/2020   Procedure: ESOPHAGOGASTRODUODENOSCOPY (EGD) WITH PROPOFOL;  Surgeon:  Milus Banister, MD;  Location: [facility];  Service: Endoscopy;  Laterality: N/A;   INSERTION OF DIALYSIS CATHETER  1988   "AV graft LUA & LFA; LUA worked for 1 day; LFA never worked"   IR FLUORO GUIDE CV LINE RIGHT  07/11/2019   IR FLUORO GUIDE CV LINE RIGHT  10/20/2019   IR FLUORO GUIDE CV LINE RIGHT  05/31/2020   IR PTA VENOUS EXCEPT DIALYSIS CIRCUIT  05/31/2020   IR RADIOLOGY PERIPHERAL GUIDED IV START  07/11/2019   IR US GUIDE VASC ACCESS RIGHT  07/11/2019   IR US GUIDE VASC ACCESS RIGHT  07/11/2019   IR US GUIDE VASC ACCESS RIGHT  07/11/2019   IR VENOCAVAGRAM IVC  05/31/2020   KIDNEY TRANSPLANT  1994; 1999; 2005   "right"    MULTIPLE TOOTH EXTRACTIONS     POLYPECTOMY  09/12/2020   Procedure: POLYPECTOMY;  Surgeon: Milus Banister, MD;  [facility];  Service: Endoscopy;;   RIGHT HEART CATH N/A 03/23/2019   Procedure: RIGHT HEART CATH;  Surgeon: Jolaine Artist, MD;  Location: [place] CV LAB;  Service: Cardiovascular;  Laterality: N/A;   [place]?; 1994; 2005   Patient Active Problem List   Diagnosis Date Noted   Cervical radiculitis 01/24/2021   Acute respiratory failure with hypoxia ([place]) 10/23/2020   Coagulation defect, unspecified ([place]) 08/01/2020   Aortic atherosclerosis ([place]) 07/10/2020   Secondary hypercoagulable state ([place]) 04/23/2020   Atrial flutter ([place]) 03/29/2020   Chronic pain of both knees 03/13/2020   Chills (without fever) 12/12/2019   Other pancytopenia ([place])    Decubitus ulcer of sacral region, stage 1 08/18/2019   HCAP (healthcare-associated pneumonia) 08/05/2019   Pressure injury of skin 07/24/2019   Chest congestion 07/24/2019   Itching 07/24/2019   Weakness 06/13/2019   Pneumonia due to COVID-19 virus 05/09/2019   Hypotension 05/09/2019   Symptomatic anemia 05/09/2019   CHF (congestive heart failure) ([place]) 05/09/2019   Swollen abdomen 01/04/2019   DDD (degenerative disc disease), cervical 12/06/2018   Neuropathy 12/06/2018   Paresthesia 10/11/2018   Neck pain 10/11/2018   Tobacco abuse 11/20/2017   Right leg swelling 11/20/2017   CKD (chronic kidney disease), stage III ([place]) 11/20/2017   Right leg pain 11/20/2017   Stroke (cerebrum) ([place]) 11/20/2017   GERD (gastroesophageal reflux disease) 11/20/2017   Acute venous embolism and thrombosis of deep vessels of proximal lower extremity, right ([place]) 11/20/2017   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    Thrush, oral    AKI (acute kidney injury) ([place])    Multifocal pneumonia 08/09/2017   ETD (Eustachian tube dysfunction), bilateral  08/03/2017   Acute recurrent pansinusitis 07/21/2017   Chest pain 09/29/2015   Cervical stenosis of spinal canal 01/08/2015   Urinary tract infectious disease    Muscle spasms of neck 06/15/2014   Bleeding hemorrhoid 06/15/2014   Anemia in chronic kidney disease 06/15/2014   Pyelonephritis, acute 06/13/2014   Sepsis ([place]) 06/13/2014   History of kidney transplant    Abnormal CT scan 09/14/2012   Chronic pain syndrome 04/09/2012   Dehydration, mild 04/09/2012   Gout attack 06/23/2011   Herpes infection 06/23/2011   Anxiety 06/23/2011   E coli bacteremia 06/18/2011   ESRD (end stage renal disease) ([place]) 06/12/2011   UTI (lower urinary tract infection) 06/12/2011   Bacteremia due to Gram-negative bacteria 05/23/2011   History of renal transplantation 05/22/2011  Septic shock(785.52) 05/22/2011   Acute on chronic kidney failure ([place]) 05/22/2011   Gastroparesis 04/24/2008   WEIGHT LOSS 08/24/2007   NAUSEA AND VOMITING 08/24/2007    REFERRING DIAG:  deconditioning ,ESRD ,near falls recently and hypotension  THERAPY DIAG:  Right shoulder pain, unspecified chronicity  Left shoulder pain, unspecified chronicity  Other abnormalities of gait and mobility  Cervicalgia  Abnormal posture  Cramp and spasm  PERTINENT HISTORY: Hx of stroke, DVT, CHF, Afib, Neuropathy, neck pain, gout, DDD, ESRD, cervical stenosis, dialysis (T, Th, sat), kidney transplant (failed)  PRECAUTIONS: Other: see pertinent history  ONSET DATE: chronic 2021  SUBJECTIVE: I'm hurting really bad today. I was sick after dialysis yesterday and I stayed in bed for the rest of the day.   PAIN:  Are you having pain? Yes Pain location: bil shoulders NPRS scale:  highest 10/10 current 10/10  Aggravating factors: reaching OH Relieving factors: rest Pain description: intermittent, constant, sharp, and aching Severity: high Irritability: low Stage: Chronic Stability: staying the same 24 hour pattern: no  clear pattern     OBJECTIVE:              GENERAL OBSERVATION:                     Vitals: 95/52 mmHg, 02 98, HR 60; mild edema bil hands with no erythema or warmth   Vital 03/21/2021: HR 68 BPM, 95% O2 sats, BP 97/62      PALPATION: Diffuse pain throughout bil shoulders   UPPER EXTREMITY MMT:   MMT Right 03/15/2021 Left 03/15/2021  Shoulder flexion 4 in available range 4 in available range  Shoulder abduction (C5) 3+* 3+**  Shoulder ER 3+* 3+*  Shoulder IR      Middle trapezius      Lower trapezius      Shoulder extension      Grip strength      Cervical flexion (C1,C2)      Cervical S/B (C3)      Shoulder shrug (C4)      Elbow flexion (C6)      Elbow ext (C7)      Thumb ext (C8)      Finger abd (T1)      Grossly        (Blank rows = not tested, score listed is out of 5 possible points.  N = WNL, D = diminished, C = clear for gross weakness with myotome testing, * = concordant pain with testing)   LE MMT:   MMT Right 03/15/2021 Left 03/15/2021  Hip flexion (L2, L3) 4 4  Knee extension (L3) 4 4  Knee flexion 4 4  Hip abduction 3+ 3+  Hip extension      Hip external rotation      Hip internal rotation      Hip adduction      Ankle dorsiflexion (L4)      Ankle plantarflexion (S1)      Ankle inversion      Ankle eversion      Great Toe ext (L5)      Grossly        (Blank rows = not tested, score listed is out of 5 possible points.  N = WNL, D = diminished, C = clear for gross weakness with myotome testing, * = concordant pain with testing)   UPPER EXTREMITY AROM/PROM:   ROM Right 03/15/2021 Left 03/15/2021  Shoulder flexion 70/90* 70/90*  Shoulder abduction  Shoulder internal rotation      Shoulder external rotation at side 30 30  Functional IR      Functional ER Sub occipital* L ear*  Shoulder extension      Elbow extension      Elbow flexion        (Blank rows = not tested, N = WNL, * = concordant pain with testing)     FUNCTIONAL TESTS:  2  MWT: 145' with 1 standing rest   30'' STS: 12 no UE support   Progressive balance screen:   Feet together: 10'' Semi Tandem: R in rear 10'', L in rear 10'' Tandem: R in rear 10'', L in rear 10'' SLS: R 10'', L 10'' (very difficult)   GAIT: Comments: reduced gait speed and endurance   PATIENT SURVEYS:  FOTO 41 -> 47      TODAY'S TREATMENT: [facility] Adult PT Treatment:                                                DATE: 03/21/2021 Therapeutic Exercise: Seated scapular retraction x 10 Seated ER YTB x 10 Standing shoulder flexion wall slide x 5 ea BIL LAQ x 10 ea BIL Modalities MHP to cervical spine and shoulders x 8 min     PATIENT EDUCATION:  POC, diagnosis, prognosis, HEP, and outcome measures.  Pt educated via explanation, demonstration, and handout (HEP).  Pt confirms understanding verbally.      ASTERISK SIGNS     Asterisk Signs  03/21/21            2 MWT              Function shoulder ER  BIL-to ears                                                             HOME EXERCISE PROGRAM: Provided 03/21/2021 Access Code: [email or web address] URL: [email or web address] Date: 03/21/2021 Prepared by: Evelene Croon  Exercises Seated Scapular Retraction - 1 x daily - 7 x weekly - 3 sets - 10 reps Shoulder External Rotation and Scapular Retraction with Resistance - 1 x daily - 7 x weekly - 3 sets - 10 reps Shoulder Flexion Wall Slide with Towel - 1 x daily - 7 x weekly - 3 sets - 10 reps Seated Long Arc Quad - 1 x daily - 7 x weekly - 3 sets - 10 reps     ASSESSMENT:   CLINICAL IMPRESSION: Patient presents to PT with 10/10 pain, guarded movements, facial grimacing, and decreased speed. She was able to complete all prescribed exercises with good effort and she responds well to positive encouragement throughout session. Provided HEP and patient demonstrated each exercises and verbalized understanding. Patient continues to benefit from skilled PT services and should be  progressed as able to improve functional independence.    REHAB POTENTIAL: Fair chronic; comorbidity   CLINICAL DECISION MAKING: Stable/uncomplicated   EVALUATION COMPLEXITY: Low     GOALS:   SHORT TERM GOALS:   STG Name Target Date Goal status  1 Corrina will be >75% HEP compliant to improve carryover between sessions and facilitate independent management of condition  Baseline: No HEP 04/05/2021 INITIAL    LONG TERM GOALS:    LTG Name Target Date Goal status  1 Malya will improve two minute walk test to 220' feet (MCID 40 ft).   Baseline: 145'   05/10/2021 INITIAL  2 Makaia will improve FOTO score from 41 (baseline) to 47 as a proxy for functional improvement 05/10/2021 INITIAL  3 Grey will be able to do her hair, not limited by pain   Baseline: limited 05/10/2021 INITIAL  4 Zakaria Dontrice Obando express confidence with advance HEP to allow for self management of condition 05/10/2021 INITIAL    PLAN: PT FREQUENCY: 1-2x/week   PT DURATION: 8 weeks (Ending 05/10/2021)   PLANNED INTERVENTIONS: Therapeutic exercises, Therapeutic activity, Neuro Muscular re-education, Gait training, Patient/Family education, Joint mobilization, Dry Needling, Electrical stimulation, Spinal mobilization and/or manipulation, Moist heat, Taping, Vasopneumatic device, Ionotophoresis 4mg /ml Dexamethasone, and Manual therapy   PLAN FOR NEXT SESSION: general activity tolerance and strengthening, shoulder/periscapular strengthening    Evelene Croon, PTA 03/21/2021, 1:48 PM

## 2021-03-24 ENCOUNTER — Other Ambulatory Visit: Payer: Self-pay | Admitting: Orthopaedic Surgery

## 2021-03-25 ENCOUNTER — Other Ambulatory Visit (HOSPITAL_COMMUNITY): Payer: Self-pay | Admitting: Cardiology

## 2021-03-26 ENCOUNTER — Ambulatory Visit (HOSPITAL_COMMUNITY)
Admission: RE | Admit: 2021-03-26 | Discharge: 2021-03-26 | Disposition: A | Discharge status: Home / Self Care | Payer: Medicare Other | Source: Ambulatory Visit | Attending: Nurse Practitioner | Admitting: Nurse Practitioner

## 2021-03-26 ENCOUNTER — Other Ambulatory Visit: Payer: Self-pay

## 2021-03-26 VITALS — BP 88/64 | HR 58 | Ht 59.0 in | Wt 117.4 lb

## 2021-03-26 DIAGNOSIS — R0600 Dyspnea, unspecified: Secondary | ICD-10-CM | POA: Insufficient documentation

## 2021-03-26 DIAGNOSIS — Z7901 Long term (current) use of anticoagulants: Secondary | ICD-10-CM | POA: Insufficient documentation

## 2021-03-26 DIAGNOSIS — I5032 Chronic diastolic (congestive) heart failure: Secondary | ICD-10-CM | POA: Insufficient documentation

## 2021-03-26 DIAGNOSIS — I959 Hypotension, unspecified: Secondary | ICD-10-CM | POA: Insufficient documentation

## 2021-03-26 DIAGNOSIS — N186 End stage renal disease: Secondary | ICD-10-CM | POA: Diagnosis not present

## 2021-03-26 DIAGNOSIS — M109 Gout, unspecified: Secondary | ICD-10-CM | POA: Insufficient documentation

## 2021-03-26 DIAGNOSIS — R051 Acute cough: Secondary | ICD-10-CM | POA: Diagnosis not present

## 2021-03-26 DIAGNOSIS — I272 Pulmonary hypertension, unspecified: Secondary | ICD-10-CM | POA: Insufficient documentation

## 2021-03-26 DIAGNOSIS — R0989 Other specified symptoms and signs involving the circulatory and respiratory systems: Secondary | ICD-10-CM | POA: Insufficient documentation

## 2021-03-26 DIAGNOSIS — I110 Hypertensive heart disease with heart failure: Secondary | ICD-10-CM | POA: Diagnosis not present

## 2021-03-26 DIAGNOSIS — I4892 Unspecified atrial flutter: Secondary | ICD-10-CM | POA: Diagnosis present

## 2021-03-26 DIAGNOSIS — D6869 Other thrombophilia: Secondary | ICD-10-CM

## 2021-03-26 LAB — CBC
HCT: 42.6 % (ref 36.0–46.0)
Hemoglobin: 12.2 g/dL (ref 12.0–15.0)
MCH: 26.1 pg (ref 26.0–34.0)
MCHC: 28.6 g/dL — ABNORMAL LOW (ref 30.0–36.0)
MCV: 91.2 fL (ref 80.0–100.0)
Platelets: 157 10*3/uL (ref 150–400)
RBC: 4.67 MIL/uL (ref 3.87–5.11)
RDW: 23.6 % — ABNORMAL HIGH (ref 11.5–15.5)
WBC: 5.4 10*3/uL (ref 4.0–10.5)
nRBC: 0.4 % — ABNORMAL HIGH (ref 0.0–0.2)

## 2021-03-26 LAB — COMPREHENSIVE METABOLIC PANEL
ALT: 16 U/L (ref 0–44)
AST: 28 U/L (ref 15–41)
Albumin: 4 g/dL (ref 3.5–5.0)
Alkaline Phosphatase: 123 U/L (ref 38–126)
Anion gap: 14 (ref 5–15)
BUN: 10 mg/dL (ref 6–20)
CO2: 28 mmol/L (ref 22–32)
Calcium: 9.6 mg/dL (ref 8.9–10.3)
Chloride: 92 mmol/L — ABNORMAL LOW (ref 98–111)
Creatinine, Ser: 5.25 mg/dL — ABNORMAL HIGH (ref 0.44–1.00)
GFR, Estimated: 9 mL/min — ABNORMAL LOW (ref >60)
Glucose, Bld: 83 mg/dL (ref 70–99)
Potassium: 3.7 mmol/L (ref 3.5–5.1)
Sodium: 134 mmol/L — ABNORMAL LOW (ref 135–145)
Total Bilirubin: 0.7 mg/dL (ref 0.3–1.2)
Total Protein: 7 g/dL (ref 6.5–8.1)

## 2021-03-26 LAB — TSH: TSH: 4.978 u[IU]/mL — ABNORMAL HIGH (ref 0.350–4.500)

## 2021-03-26 NOTE — Progress Notes (Signed)
Primary Care Physician: Vevelyn Francois, NP Referring Physician: AHF AHF MD: Dr. Ciro Backer Tina Watkins is a 57 y.o. female with a h/o ESRD s/p failed renal transplant now on HD TTS, orthostatic hypotension, chronic diastolic CHF, moderate pHTN, HTN and gout brought to ED from outpatient HD with rapid A.flutter and fever.  Reportedly had fever with heart rates in 150s after 1 hour of HD session.  She had 20 mg IV Cardizem push.  RVR resolved but she became hypotensive.  In ED, febrile to 100.5.  No leukocytosis.  UA, CXR and CT abdomen and pelvis without significant finding.  She was hypotensive but blood pressure improved with 500 cc bolus.  She was in sinus rhythm.  Blood cultures obtained.  She was a started on broad-spectrum antibiotics for SIRS.  The next day, blood pressure improved.  Remained in sinus rhythm.  Blood cultures NGTD. TTE without significant finding.  Cleared by nephrology for discharge.  She felt well and asked to go home.  Nephrology to follow-up on further update of her blood culture and address accordingly.  She was seen in AHF clinc, 04/03/20. It was noted that pt's monitor showed less thatn 1% aflutter  burden and was referred here to see if antiarrythmic's were indicated. Ekg today shows NSR. Pt denies having any tachy arrhythmia's. She has not started eliquis as she was waiting  to have 5 teeth extractions end of April. She has a CHA2DS2VASc score of 6 with a previous stroke. She states that when she had Covid last June, that it caused her to lose her kidney transplant that she had for 16 years done in the [place] area. Hence she had to start back on dialysis. She was started on amiodarone 04/23/20 for rapid a flutter occurring with dialysis, dose ws reduced to 100 mg daily at a later date, for mild elevation of TSH.  F/u in afib clinic, 09/18/20. She had Covid  in June with Pneumonia, She states that she recovered quickly.  EKG today shows  SR. SHe has noted  very little rapid heart rate . Will usually occur at the tail end of dialysis, of very short duration.   F/u in afib clinic, 03/26/21.she has not noted any afib. Continues on amiodarone 200 mg daily. Has dialysis yesterday. Has noted chest congestion x 3-4 days. No fever. Productive cough. Rhonchi heard throughout both lungs. Continues to smoke 1/2 pack a day. PO 97%.   Today, she denies symptoms of palpitations, chest pain, shortness of breath, orthopnea, PND, lower extremity edema, dizziness, presyncope, syncope, or neurologic sequela. The patient is tolerating medications without difficulties and is otherwise without complaint today.   Past Medical History:  Diagnosis Date   Bacteremia due to Gram-negative bacteria 05/23/2011   Blind    right eye   Blind right eye    CHF (congestive heart failure) (HCC)    Chronic lower back pain    Complication of anesthesia    "woke up during OR; I have an extremely high tolerance" (12/11/2011) 1 procedure was graft; the other procedures were procedures that are typically done with sedation.   DDD (degenerative disc disease), cervical    Depression    Dysrhythmia    "tachycardia" (12/11/2011) new onset afib 10/15/14 EKG   E coli bacteremia 06/18/2011   Elevated LDL cholesterol level 12/2018   ESRD (end stage renal disease) ([place]) 06/12/2011   Fibromyalgia    Gastroparesis    Gastroparesis    GERD (gastroesophageal  reflux disease)    Glaucoma    right eye   Gout    H/O carpal tunnel syndrome    Headache 10/2019   Headache(784.0)    "not often anymore" (12/11/2011)   Herpes genitalia 1994   History of blood transfusion    "more than a few times" (12/11/2011)   History of stomach ulcers    Hypotension    Iron deficiency anemia    New onset a-fib ([place])    10/15/14 EKG   Osteopenia    Peripheral neuropathy 11/2018   Pressure ulcer of sacral region, stage 1 07/2019   Seizures ([place]) 1994   "post transplant; only have had that one" (12/11/2011)    Spinal stenosis in cervical region    Stroke [facility])     left basal ganglia lacunar infarct; Right frontal lobe lacunar infarct.   Stroke [facility]) ~ 1999; 2001   "briefly lost my vision; lost my right eye" (12/11/2011)   Vitamin D deficiency 12/2018   Past Surgical History:  Procedure Laterality Date   ANTERIOR CERVICAL DECOMP/DISCECTOMY FUSION N/A 01/08/2015   Procedure: Anterior Cervical Three-Four/Four-Five Decompression/Diskectomy/Fusion;  Surgeon: Leeroy Cha, MD;  Location: [place] ORS;  Service: Neurosurgery;  Laterality: N/A;  C3-4 C4-5 Anterior cervical decompression/diskectomy/fusion   APPENDECTOMY  ~ 2004   BIOPSY  09/12/2020   Procedure: BIOPSY;  Surgeon: Milus Banister, MD;  Location: [facility] END[facility];  Service: Endoscopy;;   CATARACT EXTRACTION     right eye   COLONOSCOPY     COLONOSCOPY WITH PROPOFOL N/A 09/12/2020   Procedure: COLONOSCOPY WITH PROPOFOL;  Surgeon: Milus Banister, MD;  Location: [facility] ENDOSCOPY;  Service: Endoscopy;  Laterality: N/A;   ENUCLEATION  2001   "right"   ESOPHAGOGASTRODUODENOSCOPY (EGD) WITH PROPOFOL N/A 04/21/2012   Procedure: ESOPHAGOGASTRODUODENOSCOPY (EGD) WITH PROPOFOL;  Surgeon: Milus Banister, MD;  Location: [facility];  Service: Endoscopy;  Laterality: N/A;   ESOPHAGOGASTRODUODENOSCOPY (EGD) WITH PROPOFOL N/A 09/12/2020   Procedure: ESOPHAGOGASTRODUODENOSCOPY (EGD) WITH PROPOFOL;  Surgeon: Milus Banister, MD;  Location: [facility];  Service: Endoscopy;  Laterality: N/A;   INSERTION OF DIALYSIS CATHETER  1988   "AV graft LUA & LFA; LUA worked for 1 day; LFA never worked"   IR FLUORO GUIDE CV LINE RIGHT  07/11/2019   IR FLUORO GUIDE CV LINE RIGHT  10/20/2019   IR FLUORO GUIDE CV LINE RIGHT  05/31/2020   IR PTA VENOUS EXCEPT DIALYSIS CIRCUIT  05/31/2020   IR RADIOLOGY PERIPHERAL GUIDED IV START  07/11/2019   IR US GUIDE VASC ACCESS RIGHT  07/11/2019   IR US GUIDE VASC ACCESS RIGHT  07/11/2019   IR US GUIDE VASC ACCESS RIGHT  07/11/2019   IR  VENOCAVAGRAM IVC  05/31/2020   KIDNEY TRANSPLANT  1994; 1999; 2005   "right"   MULTIPLE TOOTH EXTRACTIONS     POLYPECTOMY  09/12/2020   Procedure: POLYPECTOMY;  Surgeon: Milus Banister, MD;  Location: [facility];  Service: Endoscopy;;   RIGHT HEART CATH N/A 03/23/2019   Procedure: RIGHT HEART CATH;  Surgeon: Jolaine Artist, MD;  Location: [facility] CV LAB;  Service: Cardiovascular;  Laterality: N/A;   [place]?; 1994; 2005    Current Outpatient Medications  Medication Sig Dispense Refill   albuterol (VENTOLIN HFA) 108 (90 Base) MCG/ACT inhaler Inhale 1-2 puffs into the lungs every 6 (six) hours as needed for wheezing or shortness of breath.     allopurinol (ZYLOPRIM) 100 MG tablet  Take 1 tablet (100 mg total) by mouth daily. 90 tablet 3   amiodarone (PACERONE) 200 MG tablet TAKE 1 TABLET BY MOUTH EVERY DAY (Patient taking differently: Take 200 mg by mouth daily.) 90 tablet 1   B Complex-C-Zn-Folic Acid (DIALYVITE 782-NFAO 15) 0.8 MG TABS Take 1 tablet by mouth daily.     busPIRone (BUSPAR) 10 MG tablet Take 5 mg by mouth 2 (two) times daily.     calcium acetate (PHOSLO) 667 MG capsule Take 667 mg by mouth See admin instructions. Take 1 capsule (667 mg) by mouth with each meal and with each snack     citalopram (CELEXA) 20 MG tablet Take 20 mg by mouth every evening.     Colchicine 0.6 MG CAPS Take 0.6 mg by mouth daily as needed (gout flare up).     diclofenac Sodium (VOLTAREN) 1 % GEL APPLY 2 GRAMS TO AFFECTED AREA 4 TIMES A DAY 200 g 2   diphenhydrAMINE (BENADRYL) 25 mg capsule Take 1 capsule (25 mg total) by mouth every 8 (eight) hours as needed. 30 capsule 6   ELIQUIS 2.5 MG TABS tablet TAKE 1 TABLET (2.5 MG TOTAL) BY MOUTH 2 (TWO) TIMES DAILY. NEEDS APPOINTMENT FOR MORE REFILS 30 tablet 0   famotidine (PEPCID) 20 MG tablet TAKE 1 TABLET BY MOUTH TWICE A DAY (Patient taking differently: Take 20 mg by mouth 2 (two) times daily.) 180 tablet 3    fluticasone (FLONASE) 50 MCG/ACT nasal spray Place 1 spray into both nostrils daily as needed for allergies or rhinitis.     gabapentin (NEURONTIN) 100 MG capsule TAKE 1 CAPSULE BY MOUTH TWICE A DAY MAX 2 CAPSULES DAILY 60 capsule 1   iron sucrose in sodium chloride 0.9 % 100 mL Iron Sucrose (Venofer)     latanoprost (XALATAN) 0.005 % ophthalmic solution Place 1 drop into the left eye at bedtime.  3   lidocaine (LIDODERM) 5 % Place 1 patch onto the skin daily as needed (for pain- remove old patch first).      Methoxy PEG-Epoetin Beta (MIRCERA IJ) Mircera     metoCLOPramide (REGLAN) 10 MG tablet Take 1 tablet (10 mg total) by mouth every 8 (eight) hours as needed for nausea. 90 tablet 0   metoCLOPramide (REGLAN) 5 MG tablet Take 1 tablet (5 mg total) by mouth 2 (two) times daily before a meal. 180 tablet 3   midodrine (PROAMATINE) 10 MG tablet Take 1.5 tablets (15 mg total) by mouth 3 (three) times daily. (Patient taking differently: No sig reported) 405 tablet 3   nicotine (NICODERM CQ) 21 mg/24hr patch Place 1 patch (21 mg total) onto the skin daily. (Patient taking differently: Place 21 mg onto the skin daily as needed (smoking cessation).) 90 patch 3   omeprazole (PRILOSEC) 40 MG capsule TAKE 1 CAPSULE (40 MG TOTAL) BY MOUTH DAILY. 90 capsule 1   ondansetron (ZOFRAN-ODT) 4 MG disintegrating tablet Take 1 tablet (4 mg total) by mouth every 8 (eight) hours as needed for nausea or vomiting. 20 tablet 11   oxyCODONE-acetaminophen (PERCOCET) 10-325 MG tablet Take 1 tablet by mouth 4 (four) times daily as needed for pain.     VELPHORO 500 MG chewable tablet Chew 500 mg by mouth 3 (three) times daily.     Vitamin D, Ergocalciferol, (DRISDOL) 1.25 MG (50000 UNIT) CAPS capsule TAKE 1 CAPSULE (50,000 UNITS TOTAL) BY MOUTH EVERY 7 (SEVEN) DAYS. (Patient taking differently: Take 50,000 Units by mouth every 7 (seven) days.) 5 capsule 6  zolpidem (AMBIEN) 10 MG tablet Take 1 tablet (10 mg total) by mouth at  bedtime as needed for sleep. 10 tablet 0   DULoxetine (CYMBALTA) 30 MG capsule Take 1 capsule (30 mg total) by mouth daily. (Patient not taking: Reported on 03/26/2021) 90 capsule 1   escitalopram (LEXAPRO) 20 MG tablet Take 20 mg by mouth daily. (Patient not taking: Reported on 03/26/2021)     No current facility-administered medications for this encounter.    Allergies  Allergen Reactions   Levofloxacin Itching and Rash   Tape Other (See Comments)    "Certain surgical tapes peel off my skin"    Social History   Socioeconomic History   Marital status: Single    Spouse name: Not on file   Number of children: 0   Years of education: some college   Highest education level: Some college, no degree  Occupational History   Occupation: disabled    Fish farm manager: UNEMPLOYED  Tobacco Use   Smoking status: Every Day    Packs/day: 0.33    Years: 35.00    Pack years: 11.55    Types: Cigarettes   Smokeless tobacco: Never   Tobacco comments:    Pack of cigarettes lasts 2 days 09/18/2020  Vaping Use   Vaping Use: Never used  Substance and Sexual Activity   Alcohol use: Not Currently    Alcohol/week: 1.0 standard drink    Types: 1 Standard drinks or equivalent per week    Comment: rare if out to dinner   Drug use: Not Currently    Frequency: 1.0 times per week    Types: Marijuana    Comment: smoking once a day when in severe pain   Sexual activity: Not Currently    Partners: Male    Birth control/protection: None  Other Topics Concern   Not on file  Social History Narrative   Right-handed.   Four cups caffeine per day.   Lives alone.   Social Determinants of Health   Financial Resource Strain: Not on file  Food Insecurity: Not on file  Transportation Needs: Not on file  Physical Activity: Not on file  Stress: Not on file  Social Connections: Not on file  Intimate Partner Violence: Not on file    Family History  Problem Relation Age of Onset   Glaucoma Mother     Pancreatic cancer Father    Multiple sclerosis Brother    Hypertension Maternal Grandmother    Breast cancer Neg Hx     ROS- All systems are reviewed and negative except as per the HPI above  Physical Exam: Vitals:   03/26/21 0902  BP: (!) 88/64  Pulse: (!) 58  Weight: 53.3 kg  Height: 4\' 11"  (1.499 m)   Wt Readings from Last 3 Encounters:  03/26/21 53.3 kg  10/23/20 50.1 kg  09/18/20 51.8 kg    Labs: Lab Results  Component Value Date   NA 130 (L) 02/08/2021   K 4.5 02/08/2021   CL 92 (L) 02/08/2021   CO2 21 (L) 02/08/2021   GLUCOSE 79 02/08/2021   BUN 29 (H) 02/08/2021   CREATININE 6.99 (H) 02/08/2021   CALCIUM 9.6 02/08/2021   PHOS 3.3 08/24/2019   MG 1.7 06/14/2011   Lab Results  Component Value Date   INR 1.0 02/08/2021   Lab Results  Component Value Date   CHOL 214 (H) 12/06/2018   HDL 89 12/06/2018   LDLCALC 105 (H) 12/06/2018   TRIG 118 12/06/2018  GEN- The patient is well appearing, alert and oriented x 3 today.   Head- normocephalic, atraumatic Eyes-  Sclera clear, conjunctiva pink Ears- hearing intact Oropharynx- clear Neck- supple, no JVP Lymph- no cervical lymphadenopathy Lung bilateral rhonchi bilaterally, normal work of breathing Heart- Regular rate and rhythm, 2/6 systolic murmur, no rubs or gallops, PMI not laterally displaced GI- soft, NT, ND, + BS Extremities- no clubbing, cyanosis, or edema MS- no significant deformity or atrophy Skin- no rash or lesion Psych- euthymic mood, full affect Neuro- strength and sensation are intact  EKG- sinus brady at 58 bpm. Pr int 184 ms, qrs int 74 ms, qtc 508 ms   Echo- 1. Left ventricular ejection fraction, by estimation, is 65 to 70%. The  left ventricle has normal function. The left ventricle has no regional  wall motion abnormalities. Left ventricular diastolic parameters were  normal.   2. Right ventricular systolic function is normal. The right ventricular  size is normal. There is  normal pulmonary artery systolic pressure.   3. The mitral valve is normal in structure. No evidence of mitral valve  regurgitation. No evidence of mitral stenosis.   4. The aortic valve is normal in structure. Aortic valve regurgitation is  not visualized. No aortic stenosis is present.   5. The inferior vena cava is normal in size with greater than 50%  respiratory variability, suggesting right atrial pressure of 3 mmHg.   Zio patch-Patient had a min HR of 52 bpm, max HR of 208 bpm, and avg HR of 78 bpm. Predominant underlying rhythm was Sinus Rhythm. Atrial Flutter occurred (<1% burden), ranging from 113-208 bpm (avg of 148 bpm), the longest lasting 1 hour 16 mins with an avg rate of 139 bpm. Atrial Flutter was detected within +/- 45 seconds of symptomatic patient event(s).  Stress test- 04/12/20-Study Highlights    The left ventricular ejection fraction is hyperdynamic (>65%). Nuclear stress EF: 75%. There was no ST segment deviation noted during stress. Defect 1: There is a medium defect of mild severity present in the mid anterior location. The study is normal. This is a low risk study.  EKG -Sinus brady at 58 bpm, pr int 184 ms, qrs int 74 ms, qtc 508 ms   Assessment and Plan: 1. Atrial  flutter  Doing well staying in rhythm with 100 mg daily amiodarone  Cannot use daily rate control for hypotension  Amiodarone is her choice of drug 2/2 ESRD and HD Tsh/cmet/cbc today   2. CHA2DS2VASc score of 6 Eliquis 2.5 mg bid for weight less than 60 KG and ESRD  3. Hypotension  Stable, not symptomatic   4. Chest congestion Bilateral rhonchi Po 97% on room air  Will get a CXR and will forward to PCP Will ask pt to get appointment with PCP to have lungs evaluated  She states that she is not using inhalers  Smoking cessation encouraged    F/u in afib clinic in 6 months  Butch Penny C. Osama Coleson, [facility] [address] [place], [place]  [phone]

## 2021-03-28 ENCOUNTER — Ambulatory Visit (INDEPENDENT_AMBULATORY_CARE_PROVIDER_SITE_OTHER): Payer: Medicare Other | Admitting: Nurse Practitioner

## 2021-03-28 ENCOUNTER — Ambulatory Visit: Payer: Medicare Other | Admitting: Physical Therapy

## 2021-03-28 ENCOUNTER — Ambulatory Visit: Payer: Self-pay | Admitting: Nurse Practitioner

## 2021-03-28 ENCOUNTER — Other Ambulatory Visit: Payer: Self-pay

## 2021-03-28 DIAGNOSIS — R0989 Other specified symptoms and signs involving the circulatory and respiratory systems: Secondary | ICD-10-CM

## 2021-03-28 MED ORDER — DOXYCYCLINE HYCLATE 100 MG PO TABS: 100.0000 mg | ORAL_TABLET | Freq: Two times a day (BID) | ORAL | 0 refills | Status: DC

## 2021-03-28 NOTE — Progress Notes (Signed)
   [facility] [address] , [place]  [address] Phone:  [phone]   Fax:  [phone] 

## 2021-03-30 ENCOUNTER — Encounter (HOSPITAL_COMMUNITY): Payer: Self-pay

## 2021-03-30 ENCOUNTER — Emergency Department (HOSPITAL_COMMUNITY): Payer: Medicare Other

## 2021-03-30 ENCOUNTER — Other Ambulatory Visit: Payer: Self-pay

## 2021-03-30 ENCOUNTER — Emergency Department (HOSPITAL_COMMUNITY)
Admission: EM | Admit: 2021-03-30 | Discharge: 2021-03-30 | Disposition: A | Discharge status: Home / Self Care | Payer: Medicare Other | Attending: Student | Admitting: Student

## 2021-03-30 DIAGNOSIS — I132 Hypertensive heart and chronic kidney disease with heart failure and with stage 5 chronic kidney disease, or end stage renal disease: Secondary | ICD-10-CM | POA: Diagnosis not present

## 2021-03-30 DIAGNOSIS — F17210 Nicotine dependence, cigarettes, uncomplicated: Secondary | ICD-10-CM | POA: Diagnosis not present

## 2021-03-30 DIAGNOSIS — M791 Myalgia, unspecified site: Secondary | ICD-10-CM | POA: Insufficient documentation

## 2021-03-30 DIAGNOSIS — N186 End stage renal disease: Secondary | ICD-10-CM | POA: Diagnosis not present

## 2021-03-30 DIAGNOSIS — J189 Pneumonia, unspecified organism: Secondary | ICD-10-CM

## 2021-03-30 DIAGNOSIS — R062 Wheezing: Secondary | ICD-10-CM | POA: Insufficient documentation

## 2021-03-30 DIAGNOSIS — R112 Nausea with vomiting, unspecified: Secondary | ICD-10-CM | POA: Insufficient documentation

## 2021-03-30 DIAGNOSIS — R0602 Shortness of breath: Secondary | ICD-10-CM | POA: Diagnosis not present

## 2021-03-30 DIAGNOSIS — Z79899 Other long term (current) drug therapy: Secondary | ICD-10-CM | POA: Diagnosis not present

## 2021-03-30 DIAGNOSIS — I509 Heart failure, unspecified: Secondary | ICD-10-CM | POA: Diagnosis not present

## 2021-03-30 DIAGNOSIS — Z20822 Contact with and (suspected) exposure to covid-19: Secondary | ICD-10-CM | POA: Insufficient documentation

## 2021-03-30 DIAGNOSIS — I4891 Unspecified atrial fibrillation: Secondary | ICD-10-CM | POA: Insufficient documentation

## 2021-03-30 DIAGNOSIS — R059 Cough, unspecified: Secondary | ICD-10-CM | POA: Diagnosis not present

## 2021-03-30 DIAGNOSIS — Z7902 Long term (current) use of antithrombotics/antiplatelets: Secondary | ICD-10-CM | POA: Diagnosis not present

## 2021-03-30 DIAGNOSIS — J441 Chronic obstructive pulmonary disease with (acute) exacerbation: Secondary | ICD-10-CM

## 2021-03-30 DIAGNOSIS — R1084 Generalized abdominal pain: Secondary | ICD-10-CM | POA: Diagnosis not present

## 2021-03-30 LAB — CBC WITH DIFFERENTIAL/PLATELET
Abs Immature Granulocytes: 0.01 10*3/uL (ref 0.00–0.07)
Basophils Absolute: 0 10*3/uL (ref 0.0–0.1)
Basophils Relative: 0 %
Eosinophils Absolute: 0.1 10*3/uL (ref 0.0–0.5)
Eosinophils Relative: 2 %
HCT: 38.1 % (ref 36.0–46.0)
Hemoglobin: 11.2 g/dL — ABNORMAL LOW (ref 12.0–15.0)
Immature Granulocytes: 0 %
Lymphocytes Relative: 21 %
Lymphs Abs: 0.9 10*3/uL (ref 0.7–4.0)
MCH: 25.3 pg — ABNORMAL LOW (ref 26.0–34.0)
MCHC: 29.4 g/dL — ABNORMAL LOW (ref 30.0–36.0)
MCV: 86 fL (ref 80.0–100.0)
Monocytes Absolute: 0.3 10*3/uL (ref 0.1–1.0)
Monocytes Relative: 7 %
Neutro Abs: 3 10*3/uL (ref 1.7–7.7)
Neutrophils Relative %: 70 %
Platelets: 156 10*3/uL (ref 150–400)
RBC: 4.43 MIL/uL (ref 3.87–5.11)
RDW: 22.1 % — ABNORMAL HIGH (ref 11.5–15.5)
WBC: 4.2 10*3/uL (ref 4.0–10.5)
nRBC: 0 % (ref 0.0–0.2)

## 2021-03-30 LAB — COMPREHENSIVE METABOLIC PANEL
ALT: 23 U/L (ref 0–44)
AST: 24 U/L (ref 15–41)
Albumin: 3.2 g/dL — ABNORMAL LOW (ref 3.5–5.0)
Alkaline Phosphatase: 131 U/L — ABNORMAL HIGH (ref 38–126)
Anion gap: 19 — ABNORMAL HIGH (ref 5–15)
BUN: 10 mg/dL (ref 6–20)
CO2: 24 mmol/L (ref 22–32)
Calcium: 9.5 mg/dL (ref 8.9–10.3)
Chloride: 94 mmol/L — ABNORMAL LOW (ref 98–111)
Creatinine, Ser: 3.49 mg/dL — ABNORMAL HIGH (ref 0.44–1.00)
GFR, Estimated: 15 mL/min — ABNORMAL LOW (ref >60)
Glucose, Bld: 76 mg/dL (ref 70–99)
Potassium: 3 mmol/L — ABNORMAL LOW (ref 3.5–5.1)
Sodium: 137 mmol/L (ref 135–145)
Total Bilirubin: 1.2 mg/dL (ref 0.3–1.2)
Total Protein: 6.5 g/dL (ref 6.5–8.1)

## 2021-03-30 LAB — LIPASE, BLOOD: Lipase: 29 U/L (ref 11–51)

## 2021-03-30 LAB — RESP PANEL BY RT-PCR (FLU A&B, COVID) ARPGX2
Influenza A by PCR: NEGATIVE
Influenza B by PCR: NEGATIVE
SARS Coronavirus 2 by RT PCR: NEGATIVE

## 2021-03-30 LAB — TROPONIN I (HIGH SENSITIVITY): Troponin I (High Sensitivity): 12 ng/L (ref <18)

## 2021-03-30 MED ORDER — DIPHENHYDRAMINE HCL 50 MG/ML IJ SOLN
12.5000 mg | Freq: Once | INTRAMUSCULAR | Status: AC
  Administered 2021-03-30: 12.5 mg via INTRAVENOUS
  Filled 2021-03-30: qty 1

## 2021-03-30 MED ORDER — MAGNESIUM OXIDE -MG SUPPLEMENT 400 (240 MG) MG PO TABS
800.0000 mg | ORAL_TABLET | Freq: Once | ORAL | Status: AC
  Administered 2021-03-30: 800 mg via ORAL
  Filled 2021-03-30: qty 2

## 2021-03-30 MED ORDER — HYDROMORPHONE HCL 1 MG/ML IJ SOLN
0.5000 mg | Freq: Once | INTRAMUSCULAR | Status: AC
  Administered 2021-03-30: 0.5 mg via INTRAVENOUS
  Filled 2021-03-30: qty 1

## 2021-03-30 MED ORDER — METHYLPREDNISOLONE SODIUM SUCC 125 MG IJ SOLR
125.0000 mg | Freq: Once | INTRAMUSCULAR | Status: AC
  Administered 2021-03-30: 125 mg via INTRAVENOUS
  Filled 2021-03-30: qty 2

## 2021-03-30 MED ORDER — ACETAMINOPHEN 500 MG PO TABS
1000.0000 mg | ORAL_TABLET | Freq: Once | ORAL | Status: AC
  Administered 2021-03-30: 1000 mg via ORAL
  Filled 2021-03-30: qty 2

## 2021-03-30 MED ORDER — IPRATROPIUM-ALBUTEROL 0.5-2.5 (3) MG/3ML IN SOLN
6.0000 mL | Freq: Once | RESPIRATORY_TRACT | Status: AC
  Administered 2021-03-30: 6 mL via RESPIRATORY_TRACT
  Filled 2021-03-30: qty 3

## 2021-03-30 MED ORDER — MAGNESIUM SULFATE IN D5W 1-5 GM/100ML-% IV SOLN
1.0000 g | Freq: Once | INTRAVENOUS | Status: AC
  Administered 2021-03-30: 1 g via INTRAVENOUS
  Filled 2021-03-30: qty 100

## 2021-03-30 MED ORDER — PREDNISONE 10 MG PO TABS: 40.0000 mg | ORAL_TABLET | Freq: Every day | ORAL | 0 refills | Status: DC

## 2021-03-30 MED ORDER — MORPHINE SULFATE (PF) 2 MG/ML IV SOLN
2.0000 mg | Freq: Once | INTRAVENOUS | Status: AC
  Administered 2021-03-30: 2 mg via INTRAVENOUS
  Filled 2021-03-30: qty 1

## 2021-03-30 MED ORDER — AMOXICILLIN-POT CLAVULANATE 875-125 MG PO TABS: 1.0000 | ORAL_TABLET | Freq: Two times a day (BID) | ORAL | 0 refills | Status: DC

## 2021-03-30 MED ORDER — IOHEXOL 300 MG/ML  SOLN
75.0000 mL | Freq: Once | INTRAMUSCULAR | Status: AC | PRN
  Administered 2021-03-30: 75 mL via INTRAVENOUS

## 2021-03-30 MED ORDER — LACTATED RINGERS IV BOLUS
1000.0000 mL | Freq: Once | INTRAVENOUS | Status: AC
  Administered 2021-03-30: 1000 mL via INTRAVENOUS

## 2021-03-30 MED ORDER — METOCLOPRAMIDE HCL 5 MG/ML IJ SOLN
10.0000 mg | Freq: Once | INTRAMUSCULAR | Status: AC
Start: 2021-03-30 — End: 2021-03-30
  Administered 2021-03-30: 10 mg via INTRAVENOUS
  Filled 2021-03-30: qty 2

## 2021-03-30 MED ORDER — POTASSIUM CHLORIDE CRYS ER 20 MEQ PO TBCR
40.0000 meq | EXTENDED_RELEASE_TABLET | Freq: Once | ORAL | Status: AC
  Administered 2021-03-30: 40 meq via ORAL
  Filled 2021-03-30: qty 2

## 2021-03-30 MED ORDER — ONDANSETRON HCL 4 MG/2ML IJ SOLN
4.0000 mg | Freq: Once | INTRAMUSCULAR | Status: AC
  Administered 2021-03-30: 4 mg via INTRAVENOUS
  Filled 2021-03-30: qty 2

## 2021-03-30 MED ORDER — MIDODRINE HCL 5 MG PO TABS
7.5000 mg | ORAL_TABLET | Freq: Three times a day (TID) | ORAL | Status: DC
  Administered 2021-03-30: 7.5 mg via ORAL
  Filled 2021-03-30: qty 2

## 2021-03-30 MED ORDER — IPRATROPIUM-ALBUTEROL 0.5-2.5 (3) MG/3ML IN SOLN
3.0000 mL | Freq: Once | RESPIRATORY_TRACT | Status: AC
  Administered 2021-03-30: 3 mL via RESPIRATORY_TRACT
  Filled 2021-03-30: qty 3

## 2021-03-30 NOTE — ED Triage Notes (Signed)
Pt states she is currently on doxycycline for pneumonia. Pt reports after taking it, she has been experiencing abdominal pain, nausea, and vomiting. Pt also c/o ongoing chest pain and sob since Monday.

## 2021-03-30 NOTE — ED Notes (Signed)
IV team at bedside after two unsuccessful iv attempts by this RN and Thurmond Butts, RN

## 2021-03-30 NOTE — ED Provider Notes (Signed)
[facility] EMERGENCY DEPARTMENT Provider Note  CSN: [id number] Arrival date & time: 03/30/21 1751  Chief Complaint(s) Abdominal Pain and Nausea  HPI Tina Watkins is a 56 y.o. female with PMH ESRD on hemodialysis Tuesday Thursday Saturday with good compliance, gastroparesis, atrial fibrillation, CHF who presents emergency department for evaluation of multiple complaints including chest pain, shortness of breath, nausea, vomiting, abdominal pain.  Patient states that for the last 5 days she has had worsening cough and chest pain with associated myalgias.  She was seen by her cardiologist to obtain a chest x-ray that did not show any focal consolidation, mildly increased bibasilar interstitial markings.  This chest x-ray was forwarded to her primary physician who placed the patient empirically on doxycycline.  Since starting doxycycline, patient has had significant nausea and vomiting.  Denies diaphoresis, weakness, numbness, tingling or other systemic symptoms.     Abdominal Pain Associated symptoms: chest pain, chills, diarrhea, nausea, shortness of breath and vomiting    Past Medical History Past Medical History:  Diagnosis Date   Bacteremia due to Gram-negative bacteria 05/23/2011   Blind    right eye   Blind right eye    CHF (congestive heart failure) (HCC)    Chronic lower back pain    Complication of anesthesia    "woke up during OR; I have an extremely high tolerance" (12/11/2011) 1 procedure was graft; the other procedures were procedures that are typically done with sedation.   DDD (degenerative disc disease), cervical    Depression    Dysrhythmia    "tachycardia" (12/11/2011) new onset afib 10/15/14 EKG   E coli bacteremia 06/18/2011   Elevated LDL cholesterol level 12/2018   ESRD (end stage renal disease) ([place]) 06/12/2011   Fibromyalgia    Gastroparesis    Gastroparesis    GERD (gastroesophageal reflux disease)    Glaucoma    right eye    Gout    H/O carpal tunnel syndrome    Headache 10/2019   Headache(784.0)    "not often anymore" (12/11/2011)   Herpes genitalia 1994   History of blood transfusion    "more than a few times" (12/11/2011)   History of stomach ulcers    Hypotension    Iron deficiency anemia    New onset a-fib ([place])    10/15/14 EKG   Osteopenia    Peripheral neuropathy 11/2018   Pressure ulcer of sacral region, stage 1 07/2019   Seizures ([place]) 1994   "post transplant; only have had that one" (12/11/2011)   Spinal stenosis in cervical region    Stroke [facility])     left basal ganglia lacunar infarct; Right frontal lobe lacunar infarct.   Stroke [facility]) ~ 1999; 2001   "briefly lost my vision; lost my right eye" (12/11/2011)   Vitamin D deficiency 12/2018   Patient Active Problem List   Diagnosis Date Noted   Cervical radiculitis 01/24/2021   Acute respiratory failure with hypoxia ([place]) 10/23/2020   Coagulation defect, unspecified ([place]) 08/01/2020   Aortic atherosclerosis ([place]) 07/10/2020   Secondary hypercoagulable state ([place]) 04/23/2020   Atrial flutter ([place]) 03/29/2020   Chronic pain of both knees 03/13/2020   Chills (without fever) 12/12/2019   Other pancytopenia ([place])    Decubitus ulcer of sacral region, stage 1 08/18/2019   HCAP (healthcare-associated pneumonia) 08/05/2019   Pressure injury of skin 07/24/2019   Chest congestion 07/24/2019   Itching 07/24/2019   Weakness 06/13/2019   Pneumonia due to COVID-19 virus 05/09/2019  Hypotension 05/09/2019   Symptomatic anemia 05/09/2019   CHF (congestive heart failure) ([place]) 05/09/2019   Swollen abdomen 01/04/2019   DDD (degenerative disc disease), cervical 12/06/2018   Neuropathy 12/06/2018   Paresthesia 10/11/2018   Neck pain 10/11/2018   Tobacco abuse 11/20/2017   Right leg swelling 11/20/2017   CKD (chronic kidney disease), stage III ([place]) 11/20/2017   Right leg pain 11/20/2017   Stroke (cerebrum) ([place]) 11/20/2017   GERD (gastroesophageal  reflux disease) 11/20/2017   Acute venous embolism and thrombosis of deep vessels of proximal lower extremity, right ([place]) 11/20/2017   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    Thrush, oral    AKI (acute kidney injury) ([place])    Multifocal pneumonia 08/09/2017   ETD (Eustachian tube dysfunction), bilateral 08/03/2017   Acute recurrent pansinusitis 07/21/2017   Chest pain 09/29/2015   Cervical stenosis of spinal canal 01/08/2015   Urinary tract infectious disease    Muscle spasms of neck 06/15/2014   Bleeding hemorrhoid 06/15/2014   Anemia in chronic kidney disease 06/15/2014   Pyelonephritis, acute 06/13/2014   Sepsis ([place]) 06/13/2014   History of kidney transplant    Abnormal CT scan 09/14/2012   Chronic pain syndrome 04/09/2012   Dehydration, mild 04/09/2012   Gout attack 06/23/2011   Herpes infection 06/23/2011   Anxiety 06/23/2011   E coli bacteremia 06/18/2011   ESRD (end stage renal disease) ([place]) 06/12/2011   UTI (lower urinary tract infection) 06/12/2011   Bacteremia due to Gram-negative bacteria 05/23/2011   History of renal transplantation 05/22/2011   Septic shock(785.52) 05/22/2011   Acute on chronic kidney failure ([place]) 05/22/2011   Gastroparesis 04/24/2008   WEIGHT LOSS 08/24/2007   NAUSEA AND VOMITING 08/24/2007   Home Medication(s) Prior to Admission medications   Medication Sig Start Date End Date Taking? Authorizing Provider  albuterol (VENTOLIN HFA) 108 (90 Base) MCG/ACT inhaler Inhale 1-2 puffs into the lungs every 6 (six) hours as needed for wheezing or shortness of breath.    [provider]  allopurinol (ZYLOPRIM) 100 MG tablet Take 1 tablet (100 mg total) by mouth daily. 01/13/20   [name], FNP  amiodarone (PACERONE) 200 MG tablet TAKE 1 TABLET BY MOUTH EVERY DAY Patient taking differently: Take 200 mg by mouth daily. 01/20/21   Sherran Needs, NP  B Complex-C-Zn-Folic Acid (DIALYVITE  800-ZINC 15) 0.8 MG TABS Take 1 tablet by mouth daily. 12/30/20   [provider]  busPIRone (BUSPAR) 10 MG tablet Take 5 mg by mouth 2 (two) times daily. 03/29/20   [provider]  calcium acetate (PHOSLO) 667 MG capsule Take 667 mg by mouth See admin instructions. Take 1 capsule (667 mg) by mouth with each meal and with each snack 07/25/20   [provider]  citalopram (CELEXA) 20 MG tablet Take 20 mg by mouth every evening. 03/29/20   [provider]  Colchicine 0.6 MG CAPS Take 0.6 mg by mouth daily as needed (gout flare up).    [provider]  diclofenac Sodium (VOLTAREN) 1 % GEL APPLY 2 GRAMS TO AFFECTED AREA 4 TIMES A DAY 03/24/21   Aundra Dubin, PA-C  diphenhydrAMINE (BENADRYL) 25 mg capsule Take 1 capsule (25 mg total) by mouth every 8 (eight) hours as needed. 07/21/19   [name], FNP  doxycycline (VIBRA-TABS) 100 MG tablet Take 1 tablet (100 mg total) by mouth 2 (two) times daily for 7 days. 03/28/21 04/04/21  Vevelyn Francois, NP  DULoxetine (CYMBALTA) 30 MG capsule Take 1 capsule (30 mg total) by mouth daily. Patient not taking: Reported on 03/26/2021 01/24/21 07/23/21  Vevelyn Francois, NP  ELIQUIS 2.5 MG TABS tablet TAKE 1 TABLET (2.5 MG TOTAL) BY MOUTH 2 (TWO) TIMES DAILY. NEEDS APPOINTMENT FOR MORE REFILS 03/25/21   Larey Dresser, MD  escitalopram (LEXAPRO) 20 MG tablet Take 20 mg by mouth daily. Patient not taking: Reported on 03/26/2021 03/21/21   [provider]  famotidine (PEPCID) 20 MG tablet TAKE 1 TABLET BY MOUTH TWICE A DAY Patient taking differently: Take 20 mg by mouth 2 (two) times daily. 11/11/20   Vevelyn Francois, NP  fluticasone (FLONASE) 50 MCG/ACT nasal spray Place 1 spray into both nostrils daily as needed for allergies or rhinitis.    [provider]  gabapentin (NEURONTIN) 100 MG capsule TAKE 1 CAPSULE BY MOUTH TWICE A DAY MAX 2 CAPSULES DAILY 03/14/21   Vevelyn Francois, NP  iron sucrose in sodium  chloride 0.9 % 100 mL Iron Sucrose (Venofer) 02/25/21   [provider]  latanoprost (XALATAN) 0.005 % ophthalmic solution Place 1 drop into the left eye at bedtime. 03/30/17   [provider]  lidocaine (LIDODERM) 5 % Place 1 patch onto the skin daily as needed (for pain- remove old patch first).  10/28/18   [provider]  Methoxy PEG-Epoetin Beta (MIRCERA IJ) Mircera 03/06/21 03/05/22  [provider]  metoCLOPramide (REGLAN) 10 MG tablet Take 1 tablet (10 mg total) by mouth every 8 (eight) hours as needed for nausea. 10/23/20 10/23/21  Vevelyn Francois, NP  metoCLOPramide (REGLAN) 5 MG tablet Take 1 tablet (5 mg total) by mouth 2 (two) times daily before a meal. 05/09/20   Milus Banister, MD  midodrine (PROAMATINE) 10 MG tablet Take 1.5 tablets (15 mg total) by mouth 3 (three) times daily. Patient taking differently: No sig reported 03/22/20   Bensimhon, Shaune Pascal, MD  nicotine (NICODERM CQ) 21 mg/24hr patch Place 1 patch (21 mg total) onto the skin daily. Patient taking differently: Place 21 mg onto the skin daily as needed (smoking cessation). 07/19/20 07/19/21  Vevelyn Francois, NP  omeprazole (PRILOSEC) 40 MG capsule TAKE 1 CAPSULE (40 MG TOTAL) BY MOUTH DAILY. 01/28/21   Milus Banister, MD  ondansetron (ZOFRAN-ODT) 4 MG disintegrating tablet Take 1 tablet (4 mg total) by mouth every 8 (eight) hours as needed for nausea or vomiting. 01/24/21   Vevelyn Francois, NP  oxyCODONE-acetaminophen (PERCOCET) 10-325 MG tablet Take 1 tablet by mouth 4 (four) times daily as needed for pain. 09/08/19   [provider]  VELPHORO 500 MG chewable tablet Chew 500 mg by mouth 3 (three) times daily. 12/12/20   [provider]  Vitamin D, Ergocalciferol, (DRISDOL) 1.25 MG (50000 UNIT) CAPS capsule TAKE 1 CAPSULE (50,000 UNITS TOTAL) BY MOUTH EVERY 7 (SEVEN) DAYS. Patient taking differently: Take 50,000 Units by mouth every 7 (seven) days. 01/17/20   [name], FNP   zolpidem (AMBIEN) 10 MG tablet Take 1 tablet (10 mg total) by mouth at bedtime as needed for sleep. 08/24/19   [name], DO  Past Surgical History Past Surgical History:  Procedure Laterality Date   ANTERIOR CERVICAL DECOMP/DISCECTOMY FUSION N/A 01/08/2015   Procedure: Anterior Cervical Three-Four/Four-Five Decompression/Diskectomy/Fusion;  Surgeon: Leeroy Cha, MD;  Location: [place];  Service: Neurosurgery;  Laterality: N/A;  C3-4 C4-5 Anterior cervical decompression/diskectomy/fusion   APPENDECTOMY  ~ 2004   BIOPSY  09/12/2020   Procedure: BIOPSY;  Surgeon: Milus Banister, MD;  Location: [facility];  Service: Endoscopy;;   CATARACT EXTRACTION     right eye   COLONOSCOPY     COLONOSCOPY WITH PROPOFOL N/A 09/12/2020   Procedure: COLONOSCOPY WITH PROPOFOL;  Surgeon: Milus Banister, MD;  Location: [facility];  Service: Endoscopy;  Laterality: N/A;   ENUCLEATION  2001   "right"   ESOPHAGOGASTRODUODENOSCOPY (EGD) WITH PROPOFOL N/A 04/21/2012   Procedure: ESOPHAGOGASTRODUODENOSCOPY (EGD) WITH PROPOFOL;  Surgeon: Milus Banister, MD;  Location: [facility];  Service: Endoscopy;  Laterality: N/A;   ESOPHAGOGASTRODUODENOSCOPY (EGD) WITH PROPOFOL N/A 09/12/2020   Procedure: ESOPHAGOGASTRODUODENOSCOPY (EGD) WITH PROPOFOL;  Surgeon: Milus Banister, MD;  Location: [facility];  Service: Endoscopy;  Laterality: N/A;   INSERTION OF DIALYSIS CATHETER  1988   "AV graft LUA & LFA; LUA worked for 1 day; LFA never worked"   IR FLUORO GUIDE CV LINE RIGHT  07/11/2019   IR FLUORO GUIDE CV LINE RIGHT  10/20/2019   IR FLUORO GUIDE CV LINE RIGHT  05/31/2020   IR PTA VENOUS EXCEPT DIALYSIS CIRCUIT  05/31/2020   IR RADIOLOGY PERIPHERAL GUIDED IV START  07/11/2019   IR US GUIDE VASC ACCESS RIGHT  07/11/2019   IR US GUIDE VASC ACCESS RIGHT  07/11/2019   IR US GUIDE VASC  ACCESS RIGHT  07/11/2019   IR VENOCAVAGRAM IVC  05/31/2020   KIDNEY TRANSPLANT  1994; 1999; 2005   "right"   MULTIPLE TOOTH EXTRACTIONS     POLYPECTOMY  09/12/2020   Procedure: POLYPECTOMY;  Surgeon: Milus Banister, MD;  Location: [facility];  Service: Endoscopy;;   RIGHT HEART CATH N/A 03/23/2019   Procedure: RIGHT HEART CATH;  Surgeon: Jolaine Artist, MD;  Location: [place] CV LAB;  Service: Cardiovascular;  Laterality: N/A;   [place]?; 1994; 2005   Family History Family History  Problem Relation Age of Onset   Glaucoma Mother    Pancreatic cancer Father    Multiple sclerosis Brother    Hypertension Maternal Grandmother    Breast cancer Neg Hx     Social History Social History   Tobacco Use   Smoking status: Every Day    Packs/day: 0.33    Years: 35.00    Pack years: 11.55    Types: Cigarettes   Smokeless tobacco: Never   Tobacco comments:    Pack of cigarettes lasts 2 days 09/18/2020  Vaping Use   Vaping Use: Never used  Substance Use Topics   Alcohol use: Not Currently    Alcohol/week: 1.0 standard drink    Types: 1 Standard drinks or equivalent per week    Comment: rare if out to dinner   Drug use: Not Currently    Frequency: 1.0 times per week    Types: Marijuana    Comment: smoking once a day when in severe pain   Allergies Levofloxacin and Tape  Review of Systems Review of Systems  Constitutional:  Positive for chills.  Respiratory:  Positive for chest tightness and shortness of breath.   Cardiovascular:  Positive for chest pain.  Gastrointestinal:  Positive for abdominal pain, diarrhea, nausea and vomiting.   Physical Exam Vital Signs  I have reviewed the triage vital signs BP 118/74    Pulse 80    Temp 97.8 F (36.6 C)    Resp (!) 21    SpO2 95%   Physical Exam Vitals and nursing note reviewed.  Constitutional:      General: She is not in acute distress.    Appearance: She is well-developed.  HENT:      Head: Normocephalic and atraumatic.  Eyes:     Conjunctiva/sclera: Conjunctivae normal.  Cardiovascular:     Rate and Rhythm: Normal rate and regular rhythm.     Heart sounds: No murmur heard. Pulmonary:     Effort: Pulmonary effort is normal. No respiratory distress.     Breath sounds: Wheezing present.  Abdominal:     Palpations: Abdomen is soft.     Tenderness: There is generalized abdominal tenderness.  Musculoskeletal:        General: No swelling.     Cervical back: Neck supple.  Skin:    General: Skin is warm and dry.     Capillary Refill: Capillary refill takes less than 2 seconds.  Neurological:     Mental Status: She is alert.  Psychiatric:        Mood and Affect: Mood normal.    ED Results and Treatments Labs (all labs ordered are listed, but only abnormal results are displayed) Labs Reviewed  CBC WITH DIFFERENTIAL/PLATELET  LIPASE, BLOOD  COMPREHENSIVE METABOLIC PANEL                                                                                                                          Radiology No results found.  Pertinent labs & imaging results that were available during my care of the patient were reviewed by me and considered in my medical decision making (see MDM for details).  Medications Ordered in ED Medications - No data to display                                                                                                                                   Procedures .Critical Care Performed by: Teressa Lower, MD Authorized by: Teressa Lower, MD   Critical care provider statement:    Critical care time (minutes):  30   Critical care was necessary to treat or prevent imminent or life-threatening  deterioration of the following conditions:  Respiratory failure   Critical care was time spent personally by me on the following activities:  Development of treatment plan with patient or surrogate, discussions with consultants, evaluation of  patient's response to treatment, examination of patient, ordering and review of laboratory studies, ordering and review of radiographic studies, ordering and performing treatments and interventions, pulse oximetry, re-evaluation of patient's condition and review of old charts  (including critical care time)  Medical Decision Making / ED Course   This patient presents to the ED for concern of shortness of breath, abdominal pain, this involves an extensive number of treatment options, and is a complaint that carries with it a high risk of complications and morbidity.  The differential diagnosis includes pneumonia, COVID-19, influenza, gastroparesis, intra-abdominal infection, COPD exacerbation  MDM: Patient seen emergency department for evaluation of multiple complaints as described above.  Physical exam reveals an ill-appearing patient with significant end expiratory wheezing and generalized tenderness on abdominal exam.  Laboratory evaluation with mild hypokalemia to 3.0 which was repleted here in the ER.  Creatinine 3.49 consistent with her ESRD.  Hemoglobin 11.2 also consistent with his ESRD.  COVID and flu negative.  High-sensitivity troponin negative at 12.  Patient given 3 DuoNebs and 125 methylprednisolone and on reevaluation her dyspnea had significantly improved.  She did have persistent abdominal pain though prompting evaluation with CT imaging.  CT chest abdomen pelvis with multifocal pneumonia, pulmonary hypertension.  As the patient is having significant difficulty tolerating doxycycline, patient was switched to Augmentin.  Her ECG is nonischemic but does show a significantly prolonged QT and thus she was given magnesium here in the ER prior to Reglan.  Unfortunately, atypical coverage will be difficult as patient cannot tolerate azithromycin in the setting of her prolonged QT.  On reevaluation, patient states her symptoms have completely resolved and she feels much improved and is requesting  discharge.  We had a discussion about admission versus discharge and using shared decision making we decided to discharge patient on Augmentin as described above and she will call her primary care physician tomorrow to discuss whether or not she has COPD and if she should see a pulmonologist.  She was discharged on a 4-day course of steroids.  Of note, patient does have a discharge blood pressure of 90/58.  She states that she has chronic hypotension and takes midodrine daily.  She has not taken this medication here in the ER.  She was instructed to go home and take this medication.  Additional history obtained:  -External records from outside source obtained and reviewed including: Chart review including previous notes, labs, imaging, consultation notes   Lab Tests: -I ordered, reviewed, and interpreted labs.   The pertinent results include:   Labs Reviewed  CBC WITH DIFFERENTIAL/PLATELET  LIPASE, BLOOD  COMPREHENSIVE METABOLIC PANEL      EKG   EKG Interpretation  Date/Time:  Sunday March 30 2021 07:49:33 EST Ventricular Rate:  86 PR Interval:  173 QRS Duration: 78 QT Interval:  445 QTC Calculation: 533 R Axis:   58 Text Interpretation: Sinus rhythm Borderline repolarization abnormality Prolonged QT interval Confirmed by [place] ([id number]) on 03/30/2021 9:34:51 AM         Imaging Studies ordered: I ordered imaging studies including CXR, CTCAP I independently visualized and interpreted imaging. I agree with the radiologist interpretation   Medicines ordered and prescription drug management: No orders of the defined types were placed in this encounter.   -I have  reviewed the patients home medicines and have made adjustments as needed  Critical interventions Multiple DuoNebs, steroids   Cardiac Monitoring: The patient was maintained on a cardiac monitor.  I personally viewed and interpreted the cardiac monitored which showed an underlying rhythm of: Normal sinus  rhythm  Social Determinants of Health:  Factors impacting patients care include: none   Reevaluation: After the interventions noted above, I reevaluated the patient and found that they have :improved  Co morbidities that complicate the patient evaluation  Past Medical History:  Diagnosis Date   Bacteremia due to Gram-negative bacteria 05/23/2011   Blind    right eye   Blind right eye    CHF (congestive heart failure) ([place])    Chronic lower back pain    Complication of anesthesia    "woke up during OR; I have an extremely high tolerance" (12/11/2011) 1 procedure was graft; the other procedures were procedures that are typically done with sedation.   DDD (degenerative disc disease), cervical    Depression    Dysrhythmia    "tachycardia" (12/11/2011) new onset afib 10/15/14 EKG   E coli bacteremia 06/18/2011   Elevated LDL cholesterol level 12/2018   ESRD (end stage renal disease) ([place]) 06/12/2011   Fibromyalgia    Gastroparesis    Gastroparesis    GERD (gastroesophageal reflux disease)    Glaucoma    right eye   Gout    H/O carpal tunnel syndrome    Headache 10/2019   Headache(784.0)    "not often anymore" (12/11/2011)   Herpes genitalia 1994   History of blood transfusion    "more than a few times" (12/11/2011)   History of stomach ulcers    Hypotension    Iron deficiency anemia    New onset a-fib ([place])    10/15/14 EKG   Osteopenia    Peripheral neuropathy 11/2018   Pressure ulcer of sacral region, stage 1 07/2019   Seizures ([place]) 1994   "post transplant; only have had that one" (12/11/2011)   Spinal stenosis in cervical region    Stroke [facility])     left basal ganglia lacunar infarct; Right frontal lobe lacunar infarct.   Stroke [facility]) ~ 1999; 2001   "briefly lost my vision; lost my right eye" (12/11/2011)   Vitamin D deficiency 12/2018      Dispostion: I considered admission for this patient, but as her symptoms have resolved secondary to ER intervention, her  multifocal pneumonia can be treated outside of the hospital with oral antibiotics.     Final Clinical Impression(s) / ED Diagnoses Final diagnoses:  None     @PCDICTATION @    Teressa Lower, MD 03/30/21 [phone]

## 2021-03-31 ENCOUNTER — Telehealth: Payer: Self-pay

## 2021-03-31 ENCOUNTER — Other Ambulatory Visit: Payer: Self-pay | Admitting: Nurse Practitioner

## 2021-03-31 DIAGNOSIS — J449 Chronic obstructive pulmonary disease, unspecified: Secondary | ICD-10-CM

## 2021-03-31 NOTE — Progress Notes (Signed)
   [facility] [address] [place], [place]  [address] Phone:  [phone]   Fax:  [phone] 

## 2021-03-31 NOTE — Telephone Encounter (Signed)
Pt called left message on nurse line stated she was discharged from the hospital and was told she need to see a pulmonologist for copd. Pt would like a referral to see the Pulmonologist

## 2021-04-02 ENCOUNTER — Ambulatory Visit: Payer: Medicare Other | Admitting: Physical Therapy

## 2021-04-04 ENCOUNTER — Ambulatory Visit: Payer: Medicare Other | Admitting: Nurse Practitioner

## 2021-04-07 ENCOUNTER — Ambulatory Visit (INDEPENDENT_AMBULATORY_CARE_PROVIDER_SITE_OTHER): Payer: Medicare Other | Admitting: Nurse Practitioner

## 2021-04-07 ENCOUNTER — Other Ambulatory Visit: Payer: Self-pay

## 2021-04-07 ENCOUNTER — Encounter: Payer: Self-pay | Admitting: Nurse Practitioner

## 2021-04-07 VITALS — BP 99/54 | HR 60 | Temp 97.7°F | Ht 59.0 in | Wt 117.0 lb

## 2021-04-07 DIAGNOSIS — Z09 Encounter for follow-up examination after completed treatment for conditions other than malignant neoplasm: Secondary | ICD-10-CM | POA: Diagnosis not present

## 2021-04-07 DIAGNOSIS — Z1231 Encounter for screening mammogram for malignant neoplasm of breast: Secondary | ICD-10-CM

## 2021-04-07 DIAGNOSIS — B009 Herpesviral infection, unspecified: Secondary | ICD-10-CM | POA: Diagnosis not present

## 2021-04-07 DIAGNOSIS — K3184 Gastroparesis: Secondary | ICD-10-CM | POA: Diagnosis not present

## 2021-04-07 DIAGNOSIS — R110 Nausea: Secondary | ICD-10-CM

## 2021-04-07 MED ORDER — ONDANSETRON 8 MG PO TBDP: 8.0000 mg | ORAL_TABLET | Freq: Two times a day (BID) | ORAL | 2 refills | Status: DC

## 2021-04-07 NOTE — Patient Instructions (Signed)
Preventive Care 26-54 Years Old, Female ?Preventive care refers to lifestyle choices and visits with your health care provider that can promote health and wellness. Preventive care visits are also called wellness exams. ?What can I expect for my preventive care visit? ?Counseling ?Your health care provider may ask you questions about your: ?Medical history, including: ?Past medical problems. ?Family medical history. ?Pregnancy history. ?Current health, including: ?Menstrual cycle. ?Method of birth control. ?Emotional well-being. ?Home life and relationship well-being. ?Sexual activity and sexual health. ?Lifestyle, including: ?Alcohol, nicotine or tobacco, and drug use. ?Access to firearms. ?Diet, exercise, and sleep habits. ?Work and work Statistician. ?Sunscreen use. ?Safety issues such as seatbelt and bike helmet use. ?Physical exam ?Your health care provider will check your: ?Height and weight. These may be used to calculate your BMI (body mass index). BMI is a measurement that tells if you are at a healthy weight. ?Waist circumference. This measures the distance around your waistline. This measurement also tells if you are at a healthy weight and may help predict your risk of certain diseases, such as type 2 diabetes and high blood pressure. ?Heart rate and blood pressure. ?Body temperature. ?Skin for abnormal spots. ?What immunizations do I need? ?Vaccines are usually given at various ages, according to a schedule. Your health care provider will recommend vaccines for you based on your age, medical history, and lifestyle or other factors, such as travel or where you work. ?What tests do I need? ?Screening ?Your health care provider may recommend screening tests for certain conditions. This may include: ?Lipid and cholesterol levels. ?Diabetes screening. This is done by checking your blood sugar (glucose) after you have not eaten for a while (fasting). ?Pelvic exam and Pap test. ?Hepatitis B test. ?Hepatitis C  test. ?HIV (human immunodeficiency virus) test. ?STI (sexually transmitted infection) testing, if you are at risk. ?Lung cancer screening. ?Colorectal cancer screening. ?Mammogram. Talk with your health care provider about when you should start having regular mammograms. This may depend on whether you have a family history of breast cancer. ?BRCA-related cancer screening. This may be done if you have a family history of breast, ovarian, tubal, or peritoneal cancers. ?Bone density scan. This is done to screen for osteoporosis. ?Talk with your health care provider about your test results, treatment options, and if necessary, the need for more tests. ?Follow these instructions at home: ?Eating and drinking ? ?Eat a diet that includes fresh fruits and vegetables, whole grains, lean protein, and low-fat dairy products. ?Take vitamin and mineral supplements as recommended by your health care provider. ?Do not drink alcohol if: ?Your health care provider tells you not to drink. ?You are pregnant, may be pregnant, or are planning to become pregnant. ?If you drink alcohol: ?Limit how much you have to 0-1 drink a day. ?Know how much alcohol is in your drink. In the [place] one drink equals one 12 oz bottle of beer (355 mL), one 5 oz glass of wine (148 mL), or one 1? oz glass of hard liquor (44 mL). ?Lifestyle ?Brush your teeth every morning and night with fluoride toothpaste. Floss one time each day. ?Exercise for at least 30 minutes 5 or more days each week. ?Do not use any products that contain nicotine or tobacco. These products include cigarettes, chewing tobacco, and vaping devices, such as e-cigarettes. If you need help quitting, ask your health care provider. ?Do not use drugs. ?If you are sexually active, practice safe sex. Use a condom or other form of protection to prevent  STIs. ?If you do not wish to become pregnant, use a form of birth control. If you plan to become pregnant, see your health care provider for a  prepregnancy visit. ?Take aspirin only as told by your health care provider. Make sure that you understand how much to take and what form to take. Work with your health care provider to find out whether it is safe and beneficial for you to take aspirin daily. ?Find healthy ways to manage stress, such as: ?Meditation, yoga, or listening to music. ?Journaling. ?Talking to a trusted person. ?Spending time with friends and family. ?Minimize exposure to UV radiation to reduce your risk of skin cancer. ?Safety ?Always wear your seat belt while driving or riding in a vehicle. ?Do not drive: ?If you have been drinking alcohol. Do not ride with someone who has been drinking. ?When you are tired or distracted. ?While texting. ?If you have been using any mind-altering substances or drugs. ?Wear a helmet and other protective equipment during sports activities. ?If you have firearms in your house, make sure you follow all gun safety procedures. ?Seek help if you have been physically or sexually abused. ?What's next? ?Visit your health care provider once a year for an annual wellness visit. ?Ask your health care provider how often you should have your eyes and teeth checked. ?Stay up to date on all vaccines. ?This information is not intended to replace advice given to you by your health care provider. Make sure you discuss any questions you have with your health care provider. ?Document Revised: 07/17/2020 Document Reviewed: 07/17/2020 ?Elsevier Patient Education ? 2022 Elsevier Inc. ? ?

## 2021-04-07 NOTE — Progress Notes (Signed)
[address] [place], [place]  [address] Phone:  [phone]   Fax:  [phone]    Established Patient Office Visit  Subjective:  Patient ID: Tina Watkins, female    DOB: 01-21-1966  Age: 56 y.o. MRN: [id number]  CC:  Chief Complaint  Patient presents with   Follow-up    Pt is here from follow up treatment pt stated she has pneumonia    HPI Tina Watkins presents for follow up. She  has a past medical history of Bacteremia due to Gram-negative bacteria (05/23/2011), Blind, Blind right eye, CHF (congestive heart failure) ([place]), Chronic lower back pain, Complication of anesthesia, DDD (degenerative disc disease), cervical, Depression, Dysrhythmia, E coli bacteremia (06/18/2011), Elevated LDL cholesterol level (12/2018), ESRD (end stage renal disease) ([place]) (06/12/2011), Fibromyalgia, Gastroparesis, Gastroparesis, GERD (gastroesophageal reflux disease), Glaucoma, Gout, H/O carpal tunnel syndrome, Headache (10/2019), Headache(784.0), Herpes genitalia (1994), History of blood transfusion, History of stomach ulcers, Hypotension, Iron deficiency anemia, New onset a-fib ([place]), Osteopenia, Peripheral neuropathy (11/2018), Pressure ulcer of sacral region, stage 1 (07/2019), Seizures ([place]) (1994), Spinal stenosis in cervical region, Stroke [facility]), Stroke ([place]) (~ 1999; 2001), and Vitamin D deficiency (12/2018).   She is following up from [facility]. She was prescribed Doxycycline however this caused GI symptoms. She has to go to the ED. A CT scan was completed and she was later started on Augmentin. This was better for her stomach. She is feeling better. She continues have some nausea. The Ondansetron '4mg'$  was effective however she is having to take additional . She is requesting a refill on Acyclovir. She is not aware to her previous dose. She will call back to give the correct dose.   Past Medical History:  Diagnosis Date   Bacteremia due to  Gram-negative bacteria 05/23/2011   Blind    right eye   Blind right eye    CHF (congestive heart failure) ([facility])    Chronic lower back pain    Complication of anesthesia    "woke up during OR; I have an extremely high tolerance" (12/11/2011) 1 procedure was graft; the other procedures were procedures that are typically done with sedation.   DDD (degenerative disc disease), cervical    Depression    Dysrhythmia    "tachycardia" (12/11/2011) new onset afib 10/15/14 EKG   E coli bacteremia 06/18/2011   Elevated LDL cholesterol level 12/2018   ESRD (end stage renal disease) ([place]) 06/12/2011   Fibromyalgia    Gastroparesis    Gastroparesis    GERD (gastroesophageal reflux disease)    Glaucoma    right eye   Gout    H/O carpal tunnel syndrome    Headache 10/2019   Headache(784.0)    "not often anymore" (12/11/2011)   Herpes genitalia 1994   History of blood transfusion    "more than a few times" (12/11/2011)   History of stomach ulcers    Hypotension    Iron deficiency anemia    New onset a-fib ([place])    10/15/14 EKG   Osteopenia    Peripheral neuropathy 11/2018   Pressure ulcer of sacral region, stage 1 07/2019   Seizures ([place]) 1994   "post transplant; only have had that one" (12/11/2011)   Spinal stenosis in cervical region    Stroke [facility])     left basal ganglia lacunar infarct; Right frontal lobe lacunar infarct.   Stroke [facility]) ~ 1999; 2001   "briefly lost my vision; lost my right eye" (  12/11/2011)   Vitamin D deficiency 12/2018    Past Surgical History:  Procedure Laterality Date   ANTERIOR CERVICAL DECOMP/DISCECTOMY FUSION N/A 01/08/2015   Procedure: Anterior Cervical Three-Four/Four-Five Decompression/Diskectomy/Fusion;  Surgeon: Leeroy Cha, MD;  Location: [facility];  Service: Neurosurgery;  Laterality: N/A;  C3-4 C4-5 Anterior cervical decompression/diskectomy/fusion   APPENDECTOMY  ~ 2004   BIOPSY  09/12/2020   Procedure: BIOPSY;  Surgeon: Milus Banister, MD;   Location: [facility];  Service: Endoscopy;;   CATARACT EXTRACTION     right eye   COLONOSCOPY     COLONOSCOPY WITH PROPOFOL N/A 09/12/2020   Procedure: COLONOSCOPY WITH PROPOFOL;  Surgeon: Milus Banister, MD;  Location: [facility];  Service: Endoscopy;  Laterality: N/A;   ENUCLEATION  2001   "right"   ESOPHAGOGASTRODUODENOSCOPY (EGD) WITH PROPOFOL N/A 04/21/2012   Procedure: ESOPHAGOGASTRODUODENOSCOPY (EGD) WITH PROPOFOL;  Surgeon: Milus Banister, MD;  Location: [facility];  Service: Endoscopy;  Laterality: N/A;   ESOPHAGOGASTRODUODENOSCOPY (EGD) WITH PROPOFOL N/A 09/12/2020   Procedure: ESOPHAGOGASTRODUODENOSCOPY (EGD) WITH PROPOFOL;  Surgeon: Milus Banister, MD;  Location: [facility];  Service: Endoscopy;  Laterality: N/A;   INSERTION OF DIALYSIS CATHETER  1988   "AV graft LUA & LFA; LUA worked for 1 day; LFA never worked"   IR FLUORO GUIDE CV LINE RIGHT  07/11/2019   IR FLUORO GUIDE CV LINE RIGHT  10/20/2019   IR FLUORO GUIDE CV LINE RIGHT  05/31/2020   IR PTA VENOUS EXCEPT DIALYSIS CIRCUIT  05/31/2020   IR RADIOLOGY PERIPHERAL GUIDED IV START  07/11/2019   IR US GUIDE VASC ACCESS RIGHT  07/11/2019   IR US GUIDE VASC ACCESS RIGHT  07/11/2019   IR US GUIDE VASC ACCESS RIGHT  07/11/2019   IR VENOCAVAGRAM IVC  05/31/2020   KIDNEY TRANSPLANT  1994; 1999; 2005   "right"   MULTIPLE TOOTH EXTRACTIONS     POLYPECTOMY  09/12/2020   Procedure: POLYPECTOMY;  Surgeon: Milus Banister, MD;  Location: [facility];  Service: Endoscopy;;   RIGHT HEART CATH N/A 03/23/2019   Procedure: RIGHT HEART CATH;  Surgeon: Jolaine Artist, MD;  Location: [facility];  Service: Cardiovascular;  Laterality: N/A;   [place]?; 1994; 2005    Family History  Problem Relation Age of Onset   Glaucoma Mother    Pancreatic cancer Father    Multiple sclerosis Brother    Hypertension Maternal Grandmother    Breast cancer Neg Hx     Social History   Socioeconomic  History   Marital status: Single    Spouse name: Not on file   Number of children: 0   Years of education: some college   Highest education level: Some college, no degree  Occupational History   Occupation: disabled    Fish farm manager: UNEMPLOYED  Tobacco Use   Smoking status: Every Day    Packs/day: 0.33    Years: 35.00    Pack years: 11.55    Types: Cigarettes   Smokeless tobacco: Never   Tobacco comments:    Pack of cigarettes lasts 2 days 09/18/2020  Vaping Use   Vaping Use: Never used  Substance and Sexual Activity   Alcohol use: Not Currently    Alcohol/week: 1.0 standard drink    Types: 1 Standard drinks or equivalent per week    Comment: rare if out to dinner   Drug use: Not Currently    Frequency: 1.0 times per week  Types: Marijuana    Comment: smoking once a day when in severe pain   Sexual activity: Not Currently    Partners: Male    Birth control/protection: None  Other Topics Concern   Not on file  Social History Narrative   Right-handed.   Four cups caffeine per day.   Lives alone.   Social Determinants of Health   Financial Resource Strain: Not on file  Food Insecurity: Not on file  Transportation Needs: Not on file  Physical Activity: Not on file  Stress: Not on file  Social Connections: Not on file  Intimate Partner Violence: Not on file    Outpatient Medications Prior to Visit  Medication Sig Dispense Refill   albuterol (VENTOLIN HFA) 108 (90 Base) MCG/ACT inhaler Inhale 1-2 puffs into the lungs every 6 (six) hours as needed for wheezing or shortness of breath.     allopurinol (ZYLOPRIM) 100 MG tablet Take 1 tablet (100 mg total) by mouth daily. 90 tablet 3   amiodarone (PACERONE) 200 MG tablet TAKE 1 TABLET BY MOUTH EVERY DAY (Patient taking differently: Take 200 mg by mouth daily.) 90 tablet 1   amoxicillin-clavulanate (AUGMENTIN) 875-125 MG tablet Take 1 tablet by mouth every 12 (twelve) hours. 14 tablet 0   B Complex-C-Zn-Folic Acid (DIALYVITE  800-ZINC 15) 0.8 MG TABS Take 1 tablet by mouth daily.     busPIRone (BUSPAR) 10 MG tablet Take 5 mg by mouth 2 (two) times daily.     calcium acetate (PHOSLO) 667 MG capsule Take 667 mg by mouth See admin instructions. Take 1 capsule (667 mg) by mouth with each meal and with each snack     citalopram (CELEXA) 20 MG tablet Take 20 mg by mouth every evening.     Colchicine 0.6 MG CAPS Take 0.6 mg by mouth daily as needed (gout flare up).     diclofenac Sodium (VOLTAREN) 1 % GEL APPLY 2 GRAMS TO AFFECTED AREA 4 TIMES A DAY 200 g 2   diphenhydrAMINE (BENADRYL) 25 mg capsule Take 1 capsule (25 mg total) by mouth every 8 (eight) hours as needed. 30 capsule 6   DULoxetine (CYMBALTA) 30 MG capsule Take 1 capsule (30 mg total) by mouth daily. 90 capsule 1   ELIQUIS 2.5 MG TABS tablet TAKE 1 TABLET (2.5 MG TOTAL) BY MOUTH 2 (TWO) TIMES DAILY. NEEDS APPOINTMENT FOR MORE REFILS 30 tablet 0   escitalopram (LEXAPRO) 20 MG tablet Take 20 mg by mouth daily.     famotidine (PEPCID) 20 MG tablet TAKE 1 TABLET BY MOUTH TWICE A DAY (Patient taking differently: Take 20 mg by mouth 2 (two) times daily.) 180 tablet 3   fluticasone (FLONASE) 50 MCG/ACT nasal spray Place 1 spray into both nostrils daily as needed for allergies or rhinitis.     gabapentin (NEURONTIN) 100 MG capsule TAKE 1 CAPSULE BY MOUTH TWICE A DAY MAX 2 CAPSULES DAILY 60 capsule 1   iron sucrose in sodium chloride 0.9 % 100 mL Iron Sucrose (Venofer)     latanoprost (XALATAN) 0.005 % ophthalmic solution Place 1 drop into the left eye at bedtime.  3   lidocaine (LIDODERM) 5 % Place 1 patch onto the skin daily as needed (for pain- remove old patch first).      Methoxy PEG-Epoetin Beta (MIRCERA IJ) Mircera     metoCLOPramide (REGLAN) 10 MG tablet Take 1 tablet (10 mg total) by mouth every 8 (eight) hours as needed for nausea. 90 tablet 0   metoCLOPramide (REGLAN)  5 MG tablet Take 1 tablet (5 mg total) by mouth 2 (two) times daily before a meal. 180 tablet 3    midodrine (PROAMATINE) 10 MG tablet Take 1.5 tablets (15 mg total) by mouth 3 (three) times daily. (Patient taking differently: No sig reported) 405 tablet 3   nicotine (NICODERM CQ) 21 mg/24hr patch Place 1 patch (21 mg total) onto the skin daily. (Patient taking differently: Place 21 mg onto the skin daily as needed (smoking cessation).) 90 patch 3   omeprazole (PRILOSEC) 40 MG capsule TAKE 1 CAPSULE (40 MG TOTAL) BY MOUTH DAILY. 90 capsule 1   ondansetron (ZOFRAN-ODT) 4 MG disintegrating tablet Take 1 tablet (4 mg total) by mouth every 8 (eight) hours as needed for nausea or vomiting. 20 tablet 11   oxyCODONE-acetaminophen (PERCOCET) 10-325 MG tablet Take 1 tablet by mouth 4 (four) times daily as needed for pain.     predniSONE (DELTASONE) 10 MG tablet Take 4 tablets (40 mg total) by mouth daily. 15 tablet 0   VELPHORO 500 MG chewable tablet Chew 500 mg by mouth 3 (three) times daily.     Vitamin D, Ergocalciferol, (DRISDOL) 1.25 MG (50000 UNIT) CAPS capsule TAKE 1 CAPSULE (50,000 UNITS TOTAL) BY MOUTH EVERY 7 (SEVEN) DAYS. (Patient taking differently: Take 50,000 Units by mouth every 7 (seven) days.) 5 capsule 6   zolpidem (AMBIEN) 10 MG tablet Take 1 tablet (10 mg total) by mouth at bedtime as needed for sleep. 10 tablet 0   No facility-administered medications prior to visit.    Allergies  Allergen Reactions   Levofloxacin Itching and Rash   Tape Other (See Comments)    "Certain surgical tapes peel off my skin"    ROS Review of Systems  Gastrointestinal:  Positive for nausea.     Objective:    Physical Exam HENT:     Head: Normocephalic and atraumatic.     Nose: Nose normal.     Mouth/Throat:     Mouth: Mucous membranes are moist.  Cardiovascular:     Rate and Rhythm: Normal rate and regular rhythm.     Pulses: Normal pulses.     Heart sounds: Normal heart sounds.  Pulmonary:     Effort: Pulmonary effort is normal.  Musculoskeletal:     Cervical back: Normal range of  motion.  Skin:    General: Skin is warm.     Capillary Refill: Capillary refill takes less than 2 seconds.  Neurological:     General: No focal deficit present.     Mental Status: She is alert and oriented to person, place, and time.    BP (!) 99/54    Pulse 60    Temp 97.7 F (36.5 C)    Ht '4\' 11"'$  (1.499 m)    Wt 117 lb 0.8 oz (53.1 kg)    SpO2 100%    BMI 23.64 kg/m  Wt Readings from Last 3 Encounters:  04/07/21 117 lb 0.8 oz (53.1 kg)  03/26/21 117 lb 6.4 oz (53.3 kg)  10/23/20 110 lb 6.4 oz (50.1 kg)     Health Maintenance Due  Topic Date Due   TETANUS/TDAP  Never done   Zoster Vaccines- Shingrix (1 of 2) Never done   PAP SMEAR-Modifier  Never done    There are no preventive care reminders to display for this patient.  Lab Results  Component Value Date   TSH 4.978 (H) 03/26/2021   Lab Results  Component Value Date   WBC 4.2  03/30/2021   HGB 11.2 (L) 03/30/2021   HCT 38.1 03/30/2021   MCV 86.0 03/30/2021   PLT 156 03/30/2021   Lab Results  Component Value Date   NA 137 03/30/2021   K 3.0 (L) 03/30/2021   CO2 24 03/30/2021   GLUCOSE 76 03/30/2021   BUN 10 03/30/2021   CREATININE 3.49 (H) 03/30/2021   BILITOT 1.2 03/30/2021   ALKPHOS 131 (H) 03/30/2021   AST 24 03/30/2021   ALT 23 03/30/2021   PROT 6.5 03/30/2021   ALBUMIN 3.2 (L) 03/30/2021   CALCIUM 9.5 03/30/2021   ANIONGAP 19 (H) 03/30/2021   Lab Results  Component Value Date   CHOL 214 (H) 12/06/2018   Lab Results  Component Value Date   HDL 89 12/06/2018   Lab Results  Component Value Date   LDLCALC 105 (H) 12/06/2018   Lab Results  Component Value Date   TRIG 118 12/06/2018   Lab Results  Component Value Date   CHOLHDL 2.4 12/06/2018   Lab Results  Component Value Date   HGBA1C 4.8 05/08/2019      Assessment & Plan:   Problem List Items Addressed This Visit       Digestive   Gastroparesis Stable   Other Visit Diagnoses     Follow-up exam after treatment  Pneumonia  resolved    Nausea     Persistent    HSV (herpes simplex virus) infection     Persistent Will call back for Acyclovir dose       Meds ordered this encounter  Medications   ondansetron (ZOFRAN-ODT) 8 MG disintegrating tablet    Sig: Take 1 tablet (8 mg total) by mouth 2 (two) times daily for 15 days.    Dispense:  60 tablet    Refill:  2    Do not add to the electronic "Automatic Refill" notification system. Patient may have prescription filled one day early if pharmacy is closed on scheduled refill date.    Order Specific Question:   Supervising Provider    Answer:   Tresa Garter [[id number]]    Follow-up: Return in about 3 months (around 07/08/2021).    Vevelyn Francois, NP

## 2021-04-08 ENCOUNTER — Other Ambulatory Visit: Payer: Self-pay | Admitting: Nurse Practitioner

## 2021-04-09 ENCOUNTER — Ambulatory Visit: Payer: Medicare Other | Admitting: Internal Medicine

## 2021-04-09 ENCOUNTER — Other Ambulatory Visit: Payer: Self-pay | Admitting: Nurse Practitioner

## 2021-04-09 ENCOUNTER — Other Ambulatory Visit (HOSPITAL_COMMUNITY): Payer: Self-pay | Admitting: Cardiology

## 2021-04-09 MED ORDER — ACYCLOVIR 5 % EX OINT: 1.0000 "application " | TOPICAL_OINTMENT | CUTANEOUS | 1 refills | Status: DC

## 2021-04-10 ENCOUNTER — Ambulatory Visit: Payer: Medicare Other

## 2021-04-24 ENCOUNTER — Other Ambulatory Visit: Payer: Self-pay

## 2021-04-24 ENCOUNTER — Ambulatory Visit: Payer: Medicare Other | Admitting: Nurse Practitioner

## 2021-04-24 ENCOUNTER — Other Ambulatory Visit: Payer: Self-pay | Admitting: Gastroenterology

## 2021-04-24 DIAGNOSIS — Z0000 Encounter for general adult medical examination without abnormal findings: Secondary | ICD-10-CM

## 2021-04-24 NOTE — Progress Notes (Signed)
? ?Subjective:  ? Tina Watkins is a 56 y.o. female who presents for an Initial Medicare Annual Wellness Visit. ? ? ?I connected with  [name] on 04/24/21 by a  telephone  enabled telemedicine application and verified that I am speaking with the correct person using two identifiers. ? ?Patient Location: Home ? ?Provider Location: Office/Clinic ? ?I discussed the limitations of evaluation and management by telemedicine. The patient expressed understanding and agreed to proceed.  ? ? ?Review of Systems    ? ?  ? ?   ?Objective:  ?  ?There were no vitals filed for this visit. ?There is no height or weight on file to calculate BMI. ? ? ?  03/30/2021  ?  7:48 AM 03/14/2021  ? 10:02 AM 02/08/2021  ? 10:50 AM 10/24/2020  ?  7:31 AM 09/12/2020  ?  7:12 AM 07/18/2020  ?  3:27 PM 03/28/2020  ?  1:33 PM  ?Advanced Directives  ?Does Patient Have a Medical Advance Directive? No No No No No No No  ?Would patient like information on creating a medical advance directive?  No - Patient declined No - Patient declined No - Patient declined No - Patient declined No - Patient declined No - Patient declined  ? ? ?Current Medications (verified) ?Outpatient Encounter Medications as of 04/24/2021  ?Medication Sig  ? albuterol (VENTOLIN HFA) 108 (90 Base) MCG/ACT inhaler Inhale 1-2 puffs into the lungs every 6 (six) hours as needed for wheezing or shortness of breath.  ? allopurinol (ZYLOPRIM) 100 MG tablet TAKE 1 TABLET BY MOUTH EVERY DAY  ? amiodarone (PACERONE) 200 MG tablet TAKE 1 TABLET BY MOUTH EVERY DAY (Patient taking differently: Take 200 mg by mouth daily.)  ? apixaban (ELIQUIS) 2.5 MG TABS tablet Take 1 tablet (2.5 mg total) by mouth 2 (two) times daily. LAST REFILL NEEDS APPOINTMENT FOR ANYMORE REFILLS  ? B Complex-C-Zn-Folic Acid (DIALYVITE 841-YSAY 15) 0.8 MG TABS Take 1 tablet by mouth daily.  ? busPIRone (BUSPAR) 10 MG tablet Take 5 mg by mouth 2 (two) times daily.  ? calcium acetate (PHOSLO) 667 MG  capsule Take 667 mg by mouth See admin instructions. Take 1 capsule (667 mg) by mouth with each meal and with each snack  ? Colchicine 0.6 MG CAPS Take 0.6 mg by mouth daily as needed (gout flare up).  ? diclofenac Sodium (VOLTAREN) 1 % GEL APPLY 2 GRAMS TO AFFECTED AREA 4 TIMES A DAY  ? diphenhydrAMINE (BENADRYL) 25 mg capsule Take 1 capsule (25 mg total) by mouth every 8 (eight) hours as needed.  ? DULoxetine (CYMBALTA) 30 MG capsule Take 1 capsule (30 mg total) by mouth daily.  ? escitalopram (LEXAPRO) 20 MG tablet Take 20 mg by mouth daily.  ? fluticasone (FLONASE) 50 MCG/ACT nasal spray Place 1 spray into both nostrils daily as needed for allergies or rhinitis.  ? gabapentin (NEURONTIN) 100 MG capsule TAKE 1 CAPSULE BY MOUTH TWICE A DAY MAX 2 CAPSULES DAILY  ? iron sucrose in sodium chloride 0.9 % 100 mL Iron Sucrose (Venofer)  ? latanoprost (XALATAN) 0.005 % ophthalmic solution Place 1 drop into the left eye at bedtime.  ? lidocaine (LIDODERM) 5 % Place 1 patch onto the skin daily as needed (for pain- remove old patch first).   ? Methoxy PEG-Epoetin Beta (MIRCERA IJ) Mircera  ? metoCLOPramide (REGLAN) 10 MG tablet Take 1 tablet (10 mg total) by mouth every 8 (eight) hours as needed for nausea.  ? metoCLOPramide (  REGLAN) 5 MG tablet TAKE 1 TABLET (5 MG TOTAL) BY MOUTH 2 (TWO) TIMES DAILY BEFORE A MEAL.  ? midodrine (PROAMATINE) 10 MG tablet Take 1.5 tablets (15 mg total) by mouth 3 (three) times daily. (Patient taking differently: No sig reported)  ? nicotine (NICODERM CQ) 21 mg/24hr patch Place 1 patch (21 mg total) onto the skin daily. (Patient taking differently: Place 21 mg onto the skin daily as needed (smoking cessation).)  ? omeprazole (PRILOSEC) 40 MG capsule TAKE 1 CAPSULE (40 MG TOTAL) BY MOUTH DAILY.  ? ondansetron (ZOFRAN-ODT) 4 MG disintegrating tablet Take 1 tablet (4 mg total) by mouth every 8 (eight) hours as needed for nausea or vomiting.  ? oxyCODONE-acetaminophen (PERCOCET) 10-325 MG tablet  Take 1 tablet by mouth 4 (four) times daily as needed for pain.  ? predniSONE (DELTASONE) 10 MG tablet Take 4 tablets (40 mg total) by mouth daily.  ? Vitamin D, Ergocalciferol, (DRISDOL) 1.25 MG (50000 UNIT) CAPS capsule TAKE 1 CAPSULE (50,000 UNITS TOTAL) BY MOUTH EVERY 7 (SEVEN) DAYS  ? zolpidem (AMBIEN) 10 MG tablet Take 1 tablet (10 mg total) by mouth at bedtime as needed for sleep.  ? acyclovir ointment (ZOVIRAX) 5 % APPLY 1 APPLICATION. TOPICALLY EVERY 3 (THREE) HOURS. (Patient not taking: Reported on 04/24/2021)  ? [DISCONTINUED] amoxicillin-clavulanate (AUGMENTIN) 875-125 MG tablet Take 1 tablet by mouth every 12 (twelve) hours.  ? [DISCONTINUED] citalopram (CELEXA) 20 MG tablet Take 20 mg by mouth every evening.  ? [DISCONTINUED] famotidine (PEPCID) 20 MG tablet TAKE 1 TABLET BY MOUTH TWICE A DAY (Patient taking differently: Take 20 mg by mouth 2 (two) times daily.)  ? [DISCONTINUED] VELPHORO 500 MG chewable tablet Chew 500 mg by mouth 3 (three) times daily.  ? ?No facility-administered encounter medications on file as of 04/24/2021.  ? ? ?Allergies (verified) ?Levofloxacin and Tape  ? ?History: ?Past Medical History:  ?Diagnosis Date  ? Bacteremia due to Gram-negative bacteria 05/23/2011  ? Blind   ? right eye  ? Blind right eye   ? CHF (congestive heart failure) ([place])   ? Chronic lower back pain   ? Complication of anesthesia   ? "woke up during OR; I have an extremely high tolerance" (12/11/2011) 1 procedure was graft; the other procedures were procedures that are typically done with sedation.  ? DDD (degenerative disc disease), cervical   ? Depression   ? Dysrhythmia   ? "tachycardia" (12/11/2011) new onset afib 10/15/14 EKG  ? E coli bacteremia 06/18/2011  ? Elevated LDL cholesterol level 12/2018  ? ESRD (end stage renal disease) ([place]) 06/12/2011  ? Fibromyalgia   ? Gastroparesis   ? Gastroparesis   ? GERD (gastroesophageal reflux disease)   ? Glaucoma   ? right eye  ? Gout   ? H/O carpal tunnel syndrome   ?  Headache 10/2019  ? Headache(784.0)   ? "not often anymore" (12/11/2011)  ? Herpes genitalia 1994  ? History of blood transfusion   ? "more than a few times" (12/11/2011)  ? History of stomach ulcers   ? Hypotension   ? Iron deficiency anemia   ? New onset a-fib [facility])   ? 10/15/14 EKG  ? Osteopenia   ? Peripheral neuropathy 11/2018  ? Pressure ulcer of sacral region, stage 1 07/2019  ? Seizures ([place]) 1994  ? "post transplant; only have had that one" (12/11/2011)  ? Spinal stenosis in cervical region   ? Stroke [facility])   ?  left basal ganglia lacunar infarct;  Right frontal lobe lacunar infarct.  ? Stroke [facility]) ~ 1999; 2001  ? "briefly lost my vision; lost my right eye" (12/11/2011)  ? Vitamin D deficiency 12/2018  ? ?Past Surgical History:  ?Procedure Laterality Date  ? ANTERIOR CERVICAL DECOMP/DISCECTOMY FUSION N/A 01/08/2015  ? Procedure: Anterior Cervical Three-Four/Four-Five Decompression/Diskectomy/Fusion;  Surgeon: Leeroy Cha, MD;  Location: [place] NEURO ORS;  Service: Neurosurgery;  Laterality: N/A;  C3-4 C4-5 Anterior cervical decompression/diskectomy/fusion  ? APPENDECTOMY  ~ 2004  ? BIOPSY  09/12/2020  ? Procedure: BIOPSY;  Surgeon: Milus Banister, MD;  Location: [facility];  Service: Endoscopy;;  ? CATARACT EXTRACTION    ? right eye  ? COLONOSCOPY    ? COLONOSCOPY WITH PROPOFOL N/A 09/12/2020  ? Procedure: COLONOSCOPY WITH PROPOFOL;  Surgeon: Milus Banister, MD;  Location: [facility] ENDOSCOPY;  Service: Endoscopy;  Laterality: N/A;  ? ENUCLEATION  2001  ? "right"  ? ESOPHAGOGASTRODUODENOSCOPY (EGD) WITH PROPOFOL N/A 04/21/2012  ? Procedure: ESOPHAGOGASTRODUODENOSCOPY (EGD) WITH PROPOFOL;  Surgeon: Milus Banister, MD;  Location: [facility] ENDOSCOPY;  Service: Endoscopy;  Laterality: N/A;  ? ESOPHAGOGASTRODUODENOSCOPY (EGD) WITH PROPOFOL N/A 09/12/2020  ? Procedure: ESOPHAGOGASTRODUODENOSCOPY (EGD) WITH PROPOFOL;  Surgeon: Milus Banister, MD;  Location: [facility] ENDOSCOPY;  Service: Endoscopy;  Laterality: N/A;  ? INSERTION OF  DIALYSIS CATHETER  1988  ? "AV graft LUA & LFA; LUA worked for 1 day; LFA never worked"  ? IR FLUORO GUIDE CV LINE RIGHT  07/11/2019  ? IR FLUORO GUIDE CV LINE RIGHT  10/20/2019  ? IR FLUORO GUIDE CV LINE RIGHT

## 2021-04-25 ENCOUNTER — Ambulatory Visit: Payer: Medicare Other | Admitting: Internal Medicine

## 2021-04-28 ENCOUNTER — Encounter: Payer: Self-pay | Admitting: Pulmonary Disease

## 2021-04-28 ENCOUNTER — Other Ambulatory Visit: Payer: Self-pay

## 2021-04-28 ENCOUNTER — Ambulatory Visit (INDEPENDENT_AMBULATORY_CARE_PROVIDER_SITE_OTHER): Payer: Medicare Other | Admitting: Pulmonary Disease

## 2021-04-28 VITALS — BP 124/72 | HR 74 | Ht 59.0 in | Wt 120.6 lb

## 2021-04-28 DIAGNOSIS — R059 Cough, unspecified: Secondary | ICD-10-CM

## 2021-04-28 DIAGNOSIS — F17210 Nicotine dependence, cigarettes, uncomplicated: Secondary | ICD-10-CM | POA: Diagnosis not present

## 2021-04-28 MED ORDER — ADVAIR HFA 115-21 MCG/ACT IN AERO: 2.0000 | INHALATION_SPRAY | Freq: Two times a day (BID) | RESPIRATORY_TRACT | 12 refills | Status: DC

## 2021-04-28 NOTE — Patient Instructions (Addendum)
Start advair inhaler 2 puffs twice daily ?- rinse mouth out after each use ? ?Continue to use albuterol inhaler as needed ? ?Recommend quitting smoking using nicotine replacement therapy  ?- use nicotine patches '7mg'$  daily ?- use mini nicotine lozenges as needed ? ?Follow up in 2 months with pulmonary function tests ? ?We will refer you to our lung cancer screening program ?

## 2021-04-28 NOTE — Progress Notes (Signed)
? ?Synopsis: Referred in March 2023 for COPD by Dionisio David, NP ? ?Subjective:  ? ?PATIENT ID: Tina Watkins GENDER: female DOB: Jul 13, 1965, MRN: [id number] ? ? ?HPI ? ?Chief Complaint  ?Patient presents with  ? Consult  ?  Referred by PCP for possible COPD. States she was diagnosed about a month ago. Still has a productive cough.   ? ?Tina Watkins is a 56 year old woman, daily smoker with ESRD on HD, GERD, atrial fibrillation, CHF and CVA who is referred to pulmonary clinic for COPD.  ? ?She was recently treated for COPD exacerbation in the ER on 03/30/21. She has been doing better since then. She reports cough with pleghm production daily. She does not have any chest tightness of wheezing. She is using albuterol inhaler 1-2 times per day which helps with her cough. She reports significant shortness of breath with exertion which limits her activity level.  ? ?She is on disability. She has been smoking for 32 years, currently smoking half a pack per day but was smoking up to a pack previously.  ? ?Past Medical History:  ?Diagnosis Date  ? Bacteremia due to Gram-negative bacteria 05/23/2011  ? Blind   ? right eye  ? Blind right eye   ? CHF (congestive heart failure) ([place])   ? Chronic lower back pain   ? Complication of anesthesia   ? "woke up during OR; I have an extremely high tolerance" (12/11/2011) 1 procedure was graft; the other procedures were procedures that are typically done with sedation.  ? DDD (degenerative disc disease), cervical   ? Depression   ? Dysrhythmia   ? "tachycardia" (12/11/2011) new onset afib 10/15/14 EKG  ? E coli bacteremia 06/18/2011  ? Elevated LDL cholesterol level 12/2018  ? ESRD (end stage renal disease) ([place]) 06/12/2011  ? Fibromyalgia   ? Gastroparesis   ? Gastroparesis   ? GERD (gastroesophageal reflux disease)   ? Glaucoma   ? right eye  ? Gout   ? H/O carpal tunnel syndrome   ? Headache 10/2019  ? Headache(784.0)   ? "not often anymore" (12/11/2011)  ? Herpes genitalia  1994  ? History of blood transfusion   ? "more than a few times" (12/11/2011)  ? History of stomach ulcers   ? Hypotension   ? Iron deficiency anemia   ? New onset a-fib [facility])   ? 10/15/14 EKG  ? Osteopenia   ? Peripheral neuropathy 11/2018  ? Pressure ulcer of sacral region, stage 1 07/2019  ? Seizures ([place]) 1994  ? "post transplant; only have had that one" (12/11/2011)  ? Spinal stenosis in cervical region   ? Stroke [facility])   ?  left basal ganglia lacunar infarct; Right frontal lobe lacunar infarct.  ? Stroke [facility]) ~ 1999; 2001  ? "briefly lost my vision; lost my right eye" (12/11/2011)  ? Vitamin D deficiency 12/2018  ?  ? ?Family History  ?Problem Relation Age of Onset  ? Glaucoma Mother   ? Pancreatic cancer Father   ? Multiple sclerosis Brother   ? Hypertension Maternal Grandmother   ? Breast cancer Neg Hx   ?  ? ?Social History  ? ?Socioeconomic History  ? Marital status: Single  ?  Spouse name: Not on file  ? Number of children: 0  ? Years of education: some college  ? Highest education level: Some college, no degree  ?Occupational History  ? Occupation: disabled  ?  Employer: UNEMPLOYED  ?Tobacco Use  ?  Smoking status: Every Day  ?  Packs/day: 0.33  ?  Years: 35.00  ?  Pack years: 11.55  ?  Types: Cigarettes  ? Smokeless tobacco: Never  ? Tobacco comments:  ?  Pack of cigarettes lasts 2 days 09/18/2020  ?Vaping Use  ? Vaping Use: Never used  ?Substance and Sexual Activity  ? Alcohol use: Not Currently  ?  Alcohol/week: 1.0 standard drink  ?  Types: 1 Standard drinks or equivalent per week  ?  Comment: rare if out to dinner  ? Drug use: Not Currently  ?  Frequency: 1.0 times per week  ?  Types: Marijuana  ?  Comment: smoking once a day when in severe pain  ? Sexual activity: Not Currently  ?  Partners: Male  ?  Birth control/protection: None  ?Other Topics Concern  ? Not on file  ?Social History Narrative  ? Right-handed.  ? Four cups caffeine per day.  ? Lives alone.  ? ?Social Determinants of Health   ? ?Financial Resource Strain: Low Risk   ? Difficulty of Paying Living Expenses: Not hard at all  ?Food Insecurity: No Food Insecurity  ? Worried About Charity fundraiser in the Last Year: Never true  ? Ran Out of Food in the Last Year: Never true  ?Transportation Needs: No Transportation Needs  ? Lack of Transportation (Medical): No  ? Lack of Transportation (Non-Medical): No  ?Physical Activity: Insufficiently Active  ? Days of Exercise per Week: 4 days  ? Minutes of Exercise per Session: 20 min  ?Stress: Stress Concern Present  ? Feeling of Stress : Rather much  ?Social Connections: Moderately Isolated  ? Frequency of Communication with Friends and Family: Three times a week  ? Frequency of Social Gatherings with Friends and Family: Once a week  ? Attends Religious Services: 1 to 4 times per year  ? Active Member of Clubs or Organizations: No  ? Attends Archivist Meetings: Never  ? Marital Status: Never married  ?Intimate Partner Violence: Not At Risk  ? Fear of Current or Ex-Partner: No  ? Emotionally Abused: No  ? Physically Abused: No  ? Sexually Abused: No  ?  ? ?Allergies  ?Allergen Reactions  ? Levofloxacin Itching and Rash  ? Tape Other (See Comments)  ?  "Certain surgical tapes peel off my skin"  ?  ? ?Outpatient Medications Prior to Visit  ?Medication Sig Dispense Refill  ? acyclovir ointment (ZOVIRAX) 5 % APPLY 1 APPLICATION. TOPICALLY EVERY 3 (THREE) HOURS. 30 g 1  ? albuterol (VENTOLIN HFA) 108 (90 Base) MCG/ACT inhaler Inhale 1-2 puffs into the lungs every 6 (six) hours as needed for wheezing or shortness of breath.    ? allopurinol (ZYLOPRIM) 100 MG tablet TAKE 1 TABLET BY MOUTH EVERY DAY 90 tablet 3  ? amiodarone (PACERONE) 200 MG tablet TAKE 1 TABLET BY MOUTH EVERY DAY (Patient taking differently: Take 200 mg by mouth daily.) 90 tablet 1  ? apixaban (ELIQUIS) 2.5 MG TABS tablet Take 1 tablet (2.5 mg total) by mouth 2 (two) times daily. LAST REFILL NEEDS APPOINTMENT FOR ANYMORE REFILLS  15 tablet 0  ? B Complex-C-Zn-Folic Acid (DIALYVITE 465-KCLE 15) 0.8 MG TABS Take 1 tablet by mouth daily.    ? busPIRone (BUSPAR) 10 MG tablet Take 5 mg by mouth 2 (two) times daily.    ? calcium acetate (PHOSLO) 667 MG capsule Take 667 mg by mouth See admin instructions. Take 1 capsule (667 mg) by  mouth with each meal and with each snack    ? Colchicine 0.6 MG CAPS Take 0.6 mg by mouth daily as needed (gout flare up).    ? diclofenac Sodium (VOLTAREN) 1 % GEL APPLY 2 GRAMS TO AFFECTED AREA 4 TIMES A DAY 200 g 2  ? diphenhydrAMINE (BENADRYL) 25 mg capsule Take 1 capsule (25 mg total) by mouth every 8 (eight) hours as needed. 30 capsule 6  ? DULoxetine (CYMBALTA) 30 MG capsule Take 1 capsule (30 mg total) by mouth daily. 90 capsule 1  ? escitalopram (LEXAPRO) 20 MG tablet Take 20 mg by mouth daily.    ? fluticasone (FLONASE) 50 MCG/ACT nasal spray Place 1 spray into both nostrils daily as needed for allergies or rhinitis.    ? gabapentin (NEURONTIN) 100 MG capsule TAKE 1 CAPSULE BY MOUTH TWICE A DAY MAX 2 CAPSULES DAILY 60 capsule 1  ? iron sucrose in sodium chloride 0.9 % 100 mL Iron Sucrose (Venofer)    ? latanoprost (XALATAN) 0.005 % ophthalmic solution Place 1 drop into the left eye at bedtime.  3  ? lidocaine (LIDODERM) 5 % Place 1 patch onto the skin daily as needed (for pain- remove old patch first).     ? Methoxy PEG-Epoetin Beta (MIRCERA IJ) Mircera    ? metoCLOPramide (REGLAN) 10 MG tablet Take 1 tablet (10 mg total) by mouth every 8 (eight) hours as needed for nausea. 90 tablet 0  ? metoCLOPramide (REGLAN) 5 MG tablet TAKE 1 TABLET (5 MG TOTAL) BY MOUTH 2 (TWO) TIMES DAILY BEFORE A MEAL. 180 tablet 3  ? midodrine (PROAMATINE) 10 MG tablet Take 1.5 tablets (15 mg total) by mouth 3 (three) times daily. (Patient taking differently: No sig reported) 405 tablet 3  ? nicotine (NICODERM CQ) 21 mg/24hr patch Place 1 patch (21 mg total) onto the skin daily. (Patient taking differently: Place 21 mg onto the skin  daily as needed (smoking cessation).) 90 patch 3  ? omeprazole (PRILOSEC) 40 MG capsule TAKE 1 CAPSULE (40 MG TOTAL) BY MOUTH DAILY. 90 capsule 1  ? ondansetron (ZOFRAN-ODT) 4 MG disintegrating tablet Take 1 tablet

## 2021-05-01 ENCOUNTER — Encounter: Payer: Self-pay | Admitting: Pulmonary Disease

## 2021-05-02 ENCOUNTER — Encounter (HOSPITAL_COMMUNITY): Payer: Self-pay | Admitting: *Deleted

## 2021-05-02 ENCOUNTER — Other Ambulatory Visit (HOSPITAL_COMMUNITY): Payer: Self-pay

## 2021-05-02 ENCOUNTER — Telehealth: Payer: Self-pay

## 2021-05-02 ENCOUNTER — Telehealth: Payer: Self-pay | Admitting: Pulmonary Disease

## 2021-05-02 NOTE — Telephone Encounter (Signed)
Called and spoke with pt who states the Advair HFA inhaler that was prescribed at last OV is not covered by insurance. ? ?Routing this to Dr. Erin Fulling as well as prior auth team for recommendations. Please advise. ?

## 2021-05-02 NOTE — Telephone Encounter (Signed)
Sharlette Dense A, CPhT  You 1 minute ago (4:04 PM)  ? ?I've tried every inhaler I could think of & they all need PA's.  Do you want to stay w/Advair?   ? ?

## 2021-05-02 NOTE — Telephone Encounter (Signed)
Patient Advocate Encounter ? ?Prior Authorization for Advair HFA inhaler has been approved.   ? ?PA# [id number] ? ?Effective dates: 04/02/21 through 05/02/22 ? ?Must fill at preferred pharmacy. ? ?Patient Advocate ?Fax: [phone]  ?

## 2021-05-02 NOTE — Telephone Encounter (Signed)
Yes advair HFA 115-82mg 2 puffs twice daily or advair diskus 250-534m 1 puff twice daily. ? ?Thanks, ?JD ?

## 2021-05-05 ENCOUNTER — Other Ambulatory Visit (HOSPITAL_COMMUNITY): Payer: Self-pay

## 2021-05-05 NOTE — Telephone Encounter (Signed)
Patient Advocate Encounter ? ?Prior Authorization for [facility] has been approved.   ? ?PA# [id number] ? ?Effective dates: 04/02/21 through 05/02/22 ? ?Cannot fill w/our pharmacies ? ?Spoke with Pharmacy to Process. ? ?Patient Advocate ?Fax: [phone]  ?

## 2021-05-07 ENCOUNTER — Encounter (HOSPITAL_COMMUNITY): Payer: Self-pay

## 2021-05-07 ENCOUNTER — Ambulatory Visit (HOSPITAL_COMMUNITY)
Admission: RE | Admit: 2021-05-07 | Discharge: 2021-05-07 | Disposition: A | Discharge status: Home / Self Care | Payer: Medicare Other | Source: Ambulatory Visit | Attending: Family Medicine | Admitting: Family Medicine

## 2021-05-07 VITALS — BP 82/56 | HR 70 | Wt 111.0 lb

## 2021-05-07 DIAGNOSIS — I953 Hypotension of hemodialysis: Secondary | ICD-10-CM | POA: Diagnosis not present

## 2021-05-07 DIAGNOSIS — I4892 Unspecified atrial flutter: Secondary | ICD-10-CM | POA: Insufficient documentation

## 2021-05-07 DIAGNOSIS — K3184 Gastroparesis: Secondary | ICD-10-CM | POA: Insufficient documentation

## 2021-05-07 DIAGNOSIS — I5032 Chronic diastolic (congestive) heart failure: Secondary | ICD-10-CM | POA: Diagnosis not present

## 2021-05-07 DIAGNOSIS — Z79899 Other long term (current) drug therapy: Secondary | ICD-10-CM | POA: Insufficient documentation

## 2021-05-07 DIAGNOSIS — Y838 Other surgical procedures as the cause of abnormal reaction of the patient, or of later complication, without mention of misadventure at the time of the procedure: Secondary | ICD-10-CM | POA: Insufficient documentation

## 2021-05-07 DIAGNOSIS — M109 Gout, unspecified: Secondary | ICD-10-CM | POA: Diagnosis not present

## 2021-05-07 DIAGNOSIS — I132 Hypertensive heart and chronic kidney disease with heart failure and with stage 5 chronic kidney disease, or end stage renal disease: Secondary | ICD-10-CM | POA: Insufficient documentation

## 2021-05-07 DIAGNOSIS — F17210 Nicotine dependence, cigarettes, uncomplicated: Secondary | ICD-10-CM | POA: Insufficient documentation

## 2021-05-07 DIAGNOSIS — Z8673 Personal history of transient ischemic attack (TIA), and cerebral infarction without residual deficits: Secondary | ICD-10-CM | POA: Insufficient documentation

## 2021-05-07 DIAGNOSIS — I480 Paroxysmal atrial fibrillation: Secondary | ICD-10-CM | POA: Insufficient documentation

## 2021-05-07 DIAGNOSIS — T8612 Kidney transplant failure: Secondary | ICD-10-CM | POA: Diagnosis not present

## 2021-05-07 DIAGNOSIS — N186 End stage renal disease: Secondary | ICD-10-CM | POA: Diagnosis not present

## 2021-05-07 DIAGNOSIS — I959 Hypotension, unspecified: Secondary | ICD-10-CM | POA: Diagnosis not present

## 2021-05-07 DIAGNOSIS — Z720 Tobacco use: Secondary | ICD-10-CM

## 2021-05-07 MED ORDER — MIDODRINE HCL 10 MG PO TABS: 15.0000 mg | ORAL_TABLET | Freq: Three times a day (TID) | ORAL | 3 refills | Status: DC

## 2021-05-07 MED ORDER — APIXABAN 2.5 MG PO TABS: 2.5000 mg | ORAL_TABLET | Freq: Two times a day (BID) | ORAL | 3 refills | Status: DC

## 2021-05-07 MED ORDER — AMIODARONE HCL 200 MG PO TABS: 200.0000 mg | ORAL_TABLET | Freq: Every day | ORAL | 3 refills | Status: DC

## 2021-05-07 NOTE — Progress Notes (Signed)
? ?ADVANCED HF CLINIC NOTE ? ?Primary Cardiologist: Dr. Audie Box  ?Nephrologist: Dr. Royce Macadamia ?HF Cardiologist: Dr. Haroldine Laws ? ? ?HPI: ?Tina Watkins is a 56 y.o. female with a hx of gout, gastroparesis, CVA, ESRD status post renal transplant, heart failure with preserved ejection fraction, hypertension. ? ?In 12/21, gained a lot of fluid (~30 pounds) over the holidays and was referred to Dr. Audie Box. He recommended IV diuresis but she did not want to be admitted. Saw Dr. Marval Regal who switched to torsemide (likely 20 mg bid). No real effect. Weight down 3 pounds 130 -> 127 (baseline 112 lbs).  ? ?Admitted to the hospital 2/15-19/21 for volume overload. Did very well with IV diuresis. Weigh down to 119 pounds. She did bump creatinine to 3.6 so diuretics held for a short time. RHC attempted but could not complete due to access challenges and inability to get into the PA. RA pressure was 3 and CO was normal. There was evidence of multiple venous narrowings in the IVC.  ? ?Follow up in AHF clinic 11/21 for pre-op surgical risk stratification for PD catheter. Started on HD over the summer. Says fluid has been ok. Says BP low at HD frequently and taking midodrine. Feels fatigued. No CP. I spoke to Dr. Royce Macadamia by phone and she reports that weight has been stable but suspect she has lost muscle mass due to poor appetite and recent illnesses. She has run out of access sites and needs PD. Midodrine increased to 15 mg tid and compression hose ordered for hypotension. ? ?Admission 2/24-2/25/22 for atrial fibrillation with RVR. Sent from HD, resolved with IV diltiazem, but complicated by subsequent hypotension. Repeat echo EF 65-70%, RV ok. ? ?Having regular palpitations at HD 3/22, resolved with Xanax. Zio placed and NST ordered. 14 day Zio (3/22) showed rare PACs and PVCs, < 1% AFL. NST (3/22) no evidence of ischemia, EF > 65%, low risk study. ? ?Today she returns for HF follow up. Last seen 3/22. Overall feeling  fine. She remains weak and has SOB with increased activity or stairs. No issues with low BP at HD. Denies palpitations, abnormal  bleeding, CP, dizziness, edema, or PND/Orthopnea. Appetite ok. No fever or chills. Taking all medications. No issues or low BP with HD T/TH/Sat.  ? ?ROS: All systems reviewed and negative except as per HPI.  ? ?Cardiac studies: ?Zio (2/22): mostly NSR, < 1% AFL, rare PACs/PVCs ? ?Echo (2/22): EF 65-70%, RV ok ? ?Zio (1/22): Sinus rhythm - avg HR of 78 bpm, Attrial Flutter occurred (<1% burden), ranging from 113-208 bpm (avg of 148 bpm), the longest lasting 1 hour 16 mins with an avg rate of 139 bpm, rare PACs and PVCs.  ?  ?[place] 2/21: RHC attempted but could not complete due to  Access challenges and inability to get into the PA. RA pressure was 3 and cardiac output was normal. There was evidence of multiple venous narrowings in the IVC.  ? ?Echo 12/20: EF 60-65%, mod increased LVH, Grade III DD, mod elevated PA pressure, RVSP 44.6 mmHg, moderate TR ? ?Echo 8/17: EF 60-65%, PA peak pressure 48 mmHg, ? ?Past Medical History:  ?Diagnosis Date  ? Bacteremia due to Gram-negative bacteria 05/23/2011  ? Blind   ? right eye  ? Blind right eye   ? CHF (congestive heart failure) ([place])   ? Chronic lower back pain   ? Complication of anesthesia   ? "woke up during OR; I have an extremely high tolerance" (12/11/2011) 1 procedure  was graft; the other procedures were procedures that are typically done with sedation.  ? DDD (degenerative disc disease), cervical   ? Depression   ? Dysrhythmia   ? "tachycardia" (12/11/2011) new onset afib 10/15/14 EKG  ? E coli bacteremia 06/18/2011  ? Elevated LDL cholesterol level 12/2018  ? ESRD (end stage renal disease) ([place]) 06/12/2011  ? Fibromyalgia   ? Gastroparesis   ? Gastroparesis   ? GERD (gastroesophageal reflux disease)   ? Glaucoma   ? right eye  ? Gout   ? H/O carpal tunnel syndrome   ? Headache 10/2019  ? Headache(784.0)   ? "not often anymore" (12/11/2011)  ?  Herpes genitalia 1994  ? History of blood transfusion   ? "more than a few times" (12/11/2011)  ? History of stomach ulcers   ? Hypotension   ? Iron deficiency anemia   ? New onset a-fib [facility])   ? 10/15/14 EKG  ? Osteopenia   ? Peripheral neuropathy 11/2018  ? Pressure ulcer of sacral region, stage 1 07/2019  ? Seizures ([place]) 1994  ? "post transplant; only have had that one" (12/11/2011)  ? Spinal stenosis in cervical region   ? Stroke [facility])   ?  left basal ganglia lacunar infarct; Right frontal lobe lacunar infarct.  ? Stroke [facility]) ~ 1999; 2001  ? "briefly lost my vision; lost my right eye" (12/11/2011)  ? Vitamin D deficiency 12/2018  ? ? ?Current Outpatient Medications  ?Medication Sig Dispense Refill  ? albuterol (VENTOLIN HFA) 108 (90 Base) MCG/ACT inhaler Inhale 1-2 puffs into the lungs every 6 (six) hours as needed for wheezing or shortness of breath.    ? allopurinol (ZYLOPRIM) 100 MG tablet TAKE 1 TABLET BY MOUTH EVERY DAY 90 tablet 3  ? amiodarone (PACERONE) 200 MG tablet TAKE 1 TABLET BY MOUTH EVERY DAY (Patient taking differently: Take 200 mg by mouth daily.) 90 tablet 1  ? apixaban (ELIQUIS) 2.5 MG TABS tablet Take 1 tablet (2.5 mg total) by mouth 2 (two) times daily. LAST REFILL NEEDS APPOINTMENT FOR ANYMORE REFILLS 15 tablet 0  ? B Complex-C-Zn-Folic Acid (DIALYVITE 710-GYIR 15) 0.8 MG TABS Take 1 tablet by mouth daily.    ? busPIRone (BUSPAR) 10 MG tablet Take 5 mg by mouth 2 (two) times daily.    ? calcium acetate (PHOSLO) 667 MG capsule Take 667 mg by mouth See admin instructions. Take 1 capsule (667 mg) by mouth with each meal and with each snack    ? Colchicine 0.6 MG CAPS Take 0.6 mg by mouth daily as needed (gout flare up).    ? diclofenac Sodium (VOLTAREN) 1 % GEL APPLY 2 GRAMS TO AFFECTED AREA 4 TIMES A DAY 200 g 2  ? diphenhydrAMINE (BENADRYL) 25 mg capsule Take 1 capsule (25 mg total) by mouth every 8 (eight) hours as needed. 30 capsule 6  ? escitalopram (LEXAPRO) 20 MG tablet Take 20 mg by  mouth daily.    ? fluticasone (FLONASE) 50 MCG/ACT nasal spray Place 1 spray into both nostrils daily as needed for allergies or rhinitis.    ? fluticasone-salmeterol (ADVAIR HFA) 115-21 MCG/ACT inhaler Inhale 2 puffs into the lungs 2 (two) times daily. 1 each 12  ? gabapentin (NEURONTIN) 100 MG capsule TAKE 1 CAPSULE BY MOUTH TWICE A DAY MAX 2 CAPSULES DAILY 60 capsule 1  ? iron sucrose in sodium chloride 0.9 % 100 mL Iron Sucrose (Venofer)    ? latanoprost (XALATAN) 0.005 % ophthalmic solution Place 1  drop into the left eye at bedtime.  3  ? lidocaine (LIDODERM) 5 % Place 1 patch onto the skin daily as needed (for pain- remove old patch first).     ? Methoxy PEG-Epoetin Beta (MIRCERA IJ) Mircera    ? metoCLOPramide (REGLAN) 10 MG tablet Take 1 tablet (10 mg total) by mouth every 8 (eight) hours as needed for nausea. 90 tablet 0  ? metoCLOPramide (REGLAN) 5 MG tablet TAKE 1 TABLET (5 MG TOTAL) BY MOUTH 2 (TWO) TIMES DAILY BEFORE A MEAL. 180 tablet 3  ? midodrine (PROAMATINE) 10 MG tablet Take 1.5 tablets (15 mg total) by mouth 3 (three) times daily. (Patient taking differently: No sig reported) 405 tablet 3  ? nicotine (NICODERM CQ) 21 mg/24hr patch Place 1 patch (21 mg total) onto the skin daily. (Patient taking differently: Place 21 mg onto the skin daily as needed (smoking cessation).) 90 patch 3  ? omeprazole (PRILOSEC) 40 MG capsule TAKE 1 CAPSULE (40 MG TOTAL) BY MOUTH DAILY. 90 capsule 1  ? ondansetron (ZOFRAN-ODT) 4 MG disintegrating tablet Take 1 tablet (4 mg total) by mouth every 8 (eight) hours as needed for nausea or vomiting. 20 tablet 11  ? oxyCODONE-acetaminophen (PERCOCET) 10-325 MG tablet Take 1 tablet by mouth 4 (four) times daily as needed for pain.    ? Vitamin D, Ergocalciferol, (DRISDOL) 1.25 MG (50000 UNIT) CAPS capsule TAKE 1 CAPSULE (50,000 UNITS TOTAL) BY MOUTH EVERY 7 (SEVEN) DAYS 5 capsule 6  ? zolpidem (AMBIEN) 10 MG tablet Take 1 tablet (10 mg total) by mouth at bedtime as needed for  sleep. 10 tablet 0  ? ?No current facility-administered medications for this encounter.  ? ? ?Allergies  ?Allergen Reactions  ? Levofloxacin Itching and Rash  ? Tape Other (See Comments)  ?  "Certain surgical

## 2021-05-07 NOTE — Patient Instructions (Signed)
It was great to see you today! ?No medication changes are needed at this time. ? ? ? ?Your physician wants you to follow-up in: 4-5 months with Dr Haroldine Laws. You will receive a reminder letter in the mail two months in advance. If you don't receive a letter, please call our office to schedule the follow-up appointment. ? ? ? ?Do the following things EVERYDAY: ?Weigh yourself in the morning before breakfast. Write it down and keep it in a log. ?Take your medicines as prescribed ?Eat low salt foods--Limit salt (sodium) to 2000 mg per day.  ?Stay as active as you can everyday ?Limit all fluids for the day to less than 2 liters ? ?At the [facility], you and your health needs are our priority. As part of our continuing mission to provide you with exceptional heart care, we have created designated Provider Care Teams. These Care Teams include your primary Cardiologist (physician) and Advanced Practice Providers (APPs- Physician Assistants and Nurse Practitioners) who all work together to provide you with the care you need, when you need it.  ? ?You may see any of the following providers on your designated Care Team at your next follow up: ?Dr Glori Bickers ?Dr Loralie Champagne ?Darrick Grinder, NP ?Lyda Jester, PA ?Jessica Milford,NP ?Marlyce Huge, PA ?Audry Riles, PharmD ? ? ?Please be sure to bring in all your medications bottles to every appointment.  ? ?If you have any questions or concerns before your next appointment please send [name] a message through [place] or call our office at [phone].   ? ?TO LEAVE A MESSAGE FOR THE NURSE SELECT OPTION 2, PLEASE LEAVE A MESSAGE INCLUDING: ?YOUR NAME ?DATE OF BIRTH ?CALL BACK NUMBER ?REASON FOR CALL**this is important as we prioritize the call backs ? ?YOU WILL RECEIVE A CALL BACK THE SAME DAY AS LONG AS YOU CALL BEFORE 4:00 PM ? ? ?

## 2021-05-12 ENCOUNTER — Ambulatory Visit: Payer: Medicare Other | Admitting: Internal Medicine

## 2021-05-16 ENCOUNTER — Ambulatory Visit: Payer: Medicare Other

## 2021-05-23 ENCOUNTER — Encounter (HOSPITAL_COMMUNITY): Payer: Self-pay | Admitting: *Deleted

## 2021-05-23 ENCOUNTER — Ambulatory Visit
Admission: RE | Admit: 2021-05-23 | Discharge: 2021-05-23 | Disposition: A | Discharge status: Home / Self Care | Payer: Medicare Other | Source: Ambulatory Visit | Attending: Nurse Practitioner | Admitting: Nurse Practitioner

## 2021-05-23 ENCOUNTER — Other Ambulatory Visit: Payer: Self-pay | Admitting: Nephrology

## 2021-05-23 ENCOUNTER — Ambulatory Visit: Payer: Medicare Other

## 2021-05-23 DIAGNOSIS — Z1231 Encounter for screening mammogram for malignant neoplasm of breast: Secondary | ICD-10-CM

## 2021-06-02 ENCOUNTER — Encounter (HOSPITAL_COMMUNITY): Payer: Self-pay | Admitting: *Deleted

## 2021-06-05 ENCOUNTER — Encounter (HOSPITAL_COMMUNITY): Payer: Self-pay | Admitting: *Deleted

## 2021-06-05 NOTE — Progress Notes (Signed)
Office Visit Note  Patient: Tina Watkins             Date of Birth: 01-22-1966           MRN: [id number]             PCP: Teena Dunk, NP Referring: Vevelyn Francois, NP Visit Date: 06/06/2021   Subjective:   History of Present Illness: Tina Watkins is a 56 y.o. female here for evaluation of pain with history of neuropathy, gout, osteoarthritis, and possible fibromyalgia syndrome. 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 She has joint pain and stiffness in a lot of areas. Muscle pains in neck and upper back.  Large joint pains have been bothering her for years varying in severity.  Shoulders and back are doing worse particularly in the past month.  Episodic gout flares have caused severe pain in first MTP joints.  Most recently gout flare in the left great toe about a week ago improved with colchicine treatment.  However on a routine basis having more symptoms in the bilateral hands and shoulders.  She was started on duloxetine 30 mg for lower extremity neuropathy pain. On allopurinol 100 mg daily for gout. Previous knee xray and MRI findings with chondrocalcinosis and bone infarcts. Right hip imaging with AVN.  Activities of Daily Living:  Patient reports morning stiffness for 20 minutes.   Patient Reports nocturnal pain.  Difficulty dressing/grooming: Reports Difficulty climbing stairs: Reports Difficulty getting out of chair: Reports Difficulty using hands for taps, buttons, cutlery, and/or writing: Reports  Review of Systems  Constitutional:  Positive for fatigue.  HENT:  Negative for mouth sores, mouth dryness and nose dryness.   Eyes:  Positive for dryness. Negative for pain and itching.  Respiratory:  Positive for shortness of breath. Negative for difficulty breathing.   Cardiovascular:  Negative for chest pain and palpitations.  Gastrointestinal:  Negative for blood in stool, constipation and  diarrhea.  Endocrine: Negative for increased urination.  Genitourinary:  Negative for difficulty urinating.  Musculoskeletal:  Positive for joint pain, joint pain, joint swelling, myalgias, morning stiffness, muscle tenderness and myalgias.  Skin:  Positive for rash. Negative for color change.  Allergic/Immunologic: Negative for susceptible to infections.  Neurological:  Positive for memory loss. Negative for dizziness, numbness and headaches.  Hematological:  Positive for bruising/bleeding tendency.  Psychiatric/Behavioral:  Negative for confusion.    PMFS History:  Patient Active Problem List   Diagnosis Date Noted   Bilateral shoulder pain 06/06/2021   Bilateral hand swelling 06/06/2021   Cervical radiculitis 01/24/2021   Acute respiratory failure with hypoxia ([place]) 10/23/2020   Coagulation defect, unspecified ([place]) 08/01/2020   Aortic atherosclerosis ([place]) 07/10/2020   Secondary hypercoagulable state ([place]) 04/23/2020   Atrial flutter ([place]) 03/29/2020   Chronic pain of both knees 03/13/2020   Chills (without fever) 12/12/2019   Other pancytopenia ([place])    Decubitus ulcer of sacral region, stage 1 08/18/2019   HCAP (healthcare-associated pneumonia) 08/05/2019   Pressure injury of skin 07/24/2019   Chest congestion 07/24/2019   Itching 07/24/2019   Weakness 06/13/2019   Pneumonia due to COVID-19 virus 05/09/2019   Hypotension 05/09/2019   Symptomatic anemia 05/09/2019   CHF (congestive heart failure) ([place]) 05/09/2019   Swollen abdomen 01/04/2019   DDD (degenerative disc disease), cervical 12/06/2018   Neuropathy 12/06/2018   Paresthesia 10/11/2018   Neck pain 10/11/2018   Tobacco abuse 11/20/2017  Right leg swelling 11/20/2017   Right leg pain 11/20/2017   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   GERD (gastroesophageal reflux disease) 11/20/2017   Acute venous embolism and thrombosis of deep vessels of proximal lower extremity, right ([place]) 11/20/2017   Chronic gout due to  renal impairment involving toe of left foot without tophus    Thrush, oral    AKI (acute kidney injury) ([place])    Multifocal pneumonia 08/09/2017   ETD (Eustachian tube dysfunction), bilateral 08/03/2017   Acute recurrent pansinusitis 07/21/2017   Chest pain 09/29/2015   Cervical stenosis of spinal canal 01/08/2015   Urinary tract infectious disease    Muscle spasms of neck 06/15/2014   Bleeding hemorrhoid 06/15/2014   Anemia in chronic kidney disease 06/15/2014   Pyelonephritis, acute 06/13/2014   Sepsis ([place]) 06/13/2014   History of kidney transplant    Abnormal CT scan 09/14/2012   Chronic pain syndrome 04/09/2012   Dehydration, mild 04/09/2012   Herpes infection 06/23/2011   Anxiety 06/23/2011   E coli bacteremia 06/18/2011   ESRD (end stage renal disease) ([place]) 06/12/2011   UTI (lower urinary tract infection) 06/12/2011   Bacteremia due to Gram-negative bacteria 05/23/2011   History of renal transplantation 05/22/2011   Septic shock(785.52) 05/22/2011   Acute on chronic kidney failure ([place]) 05/22/2011   Gastroparesis 04/24/2008   WEIGHT LOSS 08/24/2007   NAUSEA AND VOMITING 08/24/2007    Past Medical History:  Diagnosis Date   Bacteremia due to Gram-negative bacteria 05/23/2011   Blind    right eye   Blind right eye    CHF (congestive heart failure) (HCC)    Chronic lower back pain    Complication of anesthesia    "woke up during OR; I have an extremely high tolerance" (12/11/2011) 1 procedure was graft; the other procedures were procedures that are typically done with sedation.   DDD (degenerative disc disease), cervical    Depression    Dysrhythmia    "tachycardia" (12/11/2011) new onset afib 10/15/14 EKG   E coli bacteremia 06/18/2011   Elevated LDL cholesterol level 12/2018   ESRD (end stage renal disease) ([place]) 06/12/2011   Fibromyalgia    Gastroparesis    Gastroparesis    GERD (gastroesophageal reflux disease)    Glaucoma    right eye   Gout    H/O carpal  tunnel syndrome    Headache 10/2019   Headache(784.0)    "not often anymore" (12/11/2011)   Herpes genitalia 1994   History of blood transfusion    "more than a few times" (12/11/2011)   History of stomach ulcers    Hypotension    Iron deficiency anemia    New onset a-fib ([place])    10/15/14 EKG   Osteopenia    Peripheral neuropathy 11/2018   Pressure ulcer of sacral region, stage 1 07/2019   Seizures ([place]) 1994   "post transplant; only have had that one" (12/11/2011)   Spinal stenosis in cervical region    Stroke [facility])     left basal ganglia lacunar infarct; Right frontal lobe lacunar infarct.   Stroke [facility]) ~ 1999; 2001   "briefly lost my vision; lost my right eye" (12/11/2011)   Vitamin D deficiency 12/2018    Family History  Problem Relation Age of Onset   Glaucoma Mother    Pancreatic cancer Father    Multiple sclerosis Brother    Diabetes Maternal Uncle    Hypertension Maternal Grandmother    Breast cancer Neg Hx  Past Surgical History:  Procedure Laterality Date   ANTERIOR CERVICAL DECOMP/DISCECTOMY FUSION N/A 01/08/2015   Procedure: Anterior Cervical Three-Four/Four-Five Decompression/Diskectomy/Fusion;  Surgeon: Leeroy Cha, MD;  Location: [facility];  Service: Neurosurgery;  Laterality: N/A;  C3-4 C4-5 Anterior cervical decompression/diskectomy/fusion   APPENDECTOMY  ~ 2004   BIOPSY  09/12/2020   Procedure: BIOPSY;  Surgeon: Milus Banister, MD;  Location: [facility];  Service: Endoscopy;;   CATARACT EXTRACTION     right eye   COLONOSCOPY     COLONOSCOPY WITH PROPOFOL N/A 09/12/2020   Procedure: COLONOSCOPY WITH PROPOFOL;  Surgeon: Milus Banister, MD;  Location: [facility];  Service: Endoscopy;  Laterality: N/A;   ENUCLEATION  2001   "right"   ESOPHAGOGASTRODUODENOSCOPY (EGD) WITH PROPOFOL N/A 04/21/2012   Procedure: ESOPHAGOGASTRODUODENOSCOPY (EGD) WITH PROPOFOL;  Surgeon: Milus Banister, MD;  Location: [facility];  Service: Endoscopy;  Laterality:  N/A;   ESOPHAGOGASTRODUODENOSCOPY (EGD) WITH PROPOFOL N/A 09/12/2020   Procedure: ESOPHAGOGASTRODUODENOSCOPY (EGD) WITH PROPOFOL;  Surgeon: Milus Banister, MD;  Location: [facility];  Service: Endoscopy;  Laterality: N/A;   INSERTION OF DIALYSIS CATHETER  1988   "AV graft LUA & LFA; LUA worked for 1 day; LFA never worked"   IR FLUORO GUIDE CV LINE RIGHT  07/11/2019   IR FLUORO GUIDE CV LINE RIGHT  10/20/2019   IR FLUORO GUIDE CV LINE RIGHT  05/31/2020   IR PTA VENOUS EXCEPT DIALYSIS CIRCUIT  05/31/2020   IR RADIOLOGY PERIPHERAL GUIDED IV START  07/11/2019   IR US GUIDE VASC ACCESS RIGHT  07/11/2019   IR US GUIDE VASC ACCESS RIGHT  07/11/2019   IR US GUIDE VASC ACCESS RIGHT  07/11/2019   IR VENOCAVAGRAM IVC  05/31/2020   KIDNEY TRANSPLANT  1994; 1999; 2005   "right"   MULTIPLE TOOTH EXTRACTIONS     POLYPECTOMY  09/12/2020   Procedure: POLYPECTOMY;  Surgeon: Milus Banister, MD;  Location: [facility];  Service: Endoscopy;;   RIGHT HEART CATH N/A 03/23/2019   Procedure: RIGHT HEART CATH;  Surgeon: Jolaine Artist, MD;  Location: [facility];  Service: Cardiovascular;  Laterality: N/A;   [place]?; 1994; 2005   Social History   Social History Narrative   Right-handed.   Four cups caffeine per day.   Lives alone.   Immunization History  Administered Date(s) Administered   Hepatitis B, ped/adol 07/20/2019, 08/29/2019, 09/21/2019   Hepb-cpg 07/20/2019, 08/29/2019, 09/21/2019, 11/23/2019   Influenza Split 11/07/2019   Influenza,inj,Quad PF,6+ Mos 11/07/2019, 11/07/2020   PFIZER Comirnaty(Gray Top)Covid-19 Tri-Sucrose Vaccine 07/29/2020   PFIZER(Purple Top)SARS-COV-2 Vaccination 05/31/2019, 06/21/2019, 10/26/2019   PPD Test 08/10/2017   Pfizer Covid-19 Vaccine Bivalent Booster 101yr & up 02/06/2021     Objective: Vital Signs: BP (!) 94/54 (BP Location: Left Arm, Patient Position: Sitting, Cuff Size: Normal)   Pulse 65   Ht '4\' 11"'$  (1.499 m)   Wt  118 lb (53.5 kg)   BMI 23.83 kg/m    Physical Exam Cardiovascular:     Rate and Rhythm: Normal rate and regular rhythm.     Heart sounds: Murmur heard.  Pulmonary:     Effort: Pulmonary effort is normal.     Breath sounds: Normal breath sounds.  Lymphadenopathy:     Cervical: No cervical adenopathy.  Skin:    General: Skin is warm and dry.     Comments: Skin tightness and thickening in extremities worse in legs, some linear excoriations, patchy  hyperpigmentation changes over knees both sides, well healed anterior leg ulcer on left side  Neurological:     Mental Status: She is alert.  Psychiatric:        Mood and Affect: Mood normal.     Musculoskeletal Exam:  Tenderness at the base of the neck paraspinal muscles Shoulders guarding against ROM in all planes, unable to full abduct overhead, decreased external rotation, flexion and extension okay, pain is worse proximally although lateral pain into upper arm provoked with overhead movement on right Elbows full ROM no tenderness or swelling Wrists full ROM no tenderness or swelling Unable to tightly close grip, tenderness at PIP joints, right thumb IP with joint widening and tenderness Knees slightly decreased flexion and extension ROM, no palpable swelling Ankles decreased ROM Left 1st MTP mild swelling and erythema present, minimally tender, right foot normal, onychomycosis changes in toenails   Investigation: No additional findings.  Imaging: MM 3D SCREEN BREAST BILATERAL  Result Date: 05/26/2021 CLINICAL DATA:  Screening. EXAM: DIGITAL SCREENING BILATERAL MAMMOGRAM WITH TOMOSYNTHESIS AND CAD TECHNIQUE: Bilateral screening digital craniocaudal and mediolateral oblique mammograms were obtained. Bilateral screening digital breast tomosynthesis was performed. The images were evaluated with computer-aided detection. COMPARISON:  Previous exam(s). ACR Breast Density Category c: The breast tissue is heterogeneously dense, which may  obscure small masses. FINDINGS: There are no findings suspicious for malignancy. IMPRESSION: No mammographic evidence of malignancy. A result letter of this screening mammogram will be mailed directly to the patient. RECOMMENDATION: Screening mammogram in one year. (Code:SM-B-01Y) BI-RADS CATEGORY  1: Negative. Electronically Signed   By: Ileana Roup M.D.   On: 05/26/2021 12:38   XR Hand 2 View Left  Result Date: 06/20/2021 X-ray left hand 2 views Radiocarpal joint space appears normal.  MCP and PIP joints appear normal.  There is mild asymmetric narrowing and some lateral deviation at the third DIP joint.  No visible erosive changes.  Bone mineralization is consistent with generalized osteopenia.  Vascular calcifications are visible. Impression Mild osteoarthritis, generalized osteopenia could be consistent with chronic inflammatory arthritis or with systemically decreased bone density  XR Hand 2 View Right  Result Date: 06/20/2021 X-ray right hand 2 views Radiocarpal joint space appears normal.  MCP and PIP joint spaces appear normal.  Erosive changes in first IP joint and third DIP with partial subluxation.  Bone mineralization consistent with generalized osteopenia.  Extensive vascular calcifications are visible. Impression Appears consistent with chronic inflammatory arthritis erosions are present limited in a distal distribution could be consistent with process including erosive OA, psoriatic disease, or chronic crystalline arthropathy  XR Shoulder Left  Result Date: 06/20/2021 X-ray left shoulder 4 views Glenohumeral joint space appears normal.  Normal internal and external rotation of humerus.  AC joint space appears normal.  No visible erosions or abnormal calcifications seen. Impression Normal appearing shoulder x-ray  XR Shoulder Right  Result Date: 06/20/2021 X-ray right shoulder 4 views Glenohumeral joint space appears normal.  Normal internal and external rotation of humerus.  AC joint  space appears normal.  No visible erosions or abnormal calcifications seen. Impression Normal appearing shoulder x-ray   Recent Labs: Lab Results  Component Value Date   WBC 4.2 03/30/2021   HGB 11.2 (L) 03/30/2021   PLT 156 03/30/2021   NA 137 03/30/2021   K 3.0 (L) 03/30/2021   CL 94 (L) 03/30/2021   CO2 24 03/30/2021   GLUCOSE 76 03/30/2021   BUN 10 03/30/2021   CREATININE 3.49 (H) 03/30/2021  BILITOT 1.2 03/30/2021   ALKPHOS 131 (H) 03/30/2021   AST 24 03/30/2021   ALT 23 03/30/2021   PROT 6.5 03/30/2021   ALBUMIN 3.2 (L) 03/30/2021   CALCIUM 9.5 03/30/2021   GFRAA 13 (L) 10/13/2019    Speciality Comments: No specialty comments available.  Procedures:  No procedures performed Allergies: Levofloxacin and Tape   Assessment / Plan:     Visit Diagnoses: 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 - Plan: Uric acid  Still having flares very recently involving the left great toe.  We will recheck uric acid level today on her current allopurinol 100 mg.  ESRD (end stage renal disease) ([place])  On maintenance hemodialysis.  DDD (degenerative disc disease), cervical Neuropathy  Bilateral hand pain and swelling does not seem typical distribution for peripheral neuropathy and cervical radiculopathy less likely for symmetric symptom involvement.  Chronic pain of both shoulders - Plan: XR Shoulder Left, XR Shoulder Right  Bilateral shoulder pain x-rays checked in clinic do not demonstrate any significant arthritis.  Suspect more consistent with rotator cuff arthropathy or could be myofascial pain syndrome.  Bilateral hand swelling - Plan: XR Hand 2 View Right, XR Hand 2 View Left  Not clear of the cause currently x-rays of hands does show erosive disease in right distal finger joints.  Does not seem likely to be associated with chronic gout with no tophi nodules or well demarcated erosions.  Potential treatment options for inflammatory  arthritis will be limited in the setting of end-stage renal disease on hemodialysis.  Orders: Orders Placed This Encounter  Procedures   XR Hand 2 View Right   XR Hand 2 View Left   XR Shoulder Left   XR Shoulder Right   Uric acid   No orders of the defined types were placed in this encounter.   Follow-Up Instructions: Return in about 2 weeks (around 06/20/2021) for New pt OA/gout/?FMS f/u 2wks.   Tina Salina, MD  Note - This record has been created using [employer].  Chart creation errors have been sought, but may not always  have been located. Such creation errors do not reflect on  the standard of medical care.

## 2021-06-06 ENCOUNTER — Ambulatory Visit (INDEPENDENT_AMBULATORY_CARE_PROVIDER_SITE_OTHER): Payer: Medicare Other

## 2021-06-06 ENCOUNTER — Ambulatory Visit (INDEPENDENT_AMBULATORY_CARE_PROVIDER_SITE_OTHER): Payer: Medicare Other | Admitting: Internal Medicine

## 2021-06-06 ENCOUNTER — Encounter: Payer: Self-pay | Admitting: Internal Medicine

## 2021-06-06 VITALS — BP 94/54 | HR 65 | Ht 59.0 in | Wt 118.0 lb

## 2021-06-06 DIAGNOSIS — M25511 Pain in right shoulder: Secondary | ICD-10-CM | POA: Diagnosis not present

## 2021-06-06 DIAGNOSIS — M5030 Other cervical disc degeneration, unspecified cervical region: Secondary | ICD-10-CM

## 2021-06-06 DIAGNOSIS — Z940 Kidney transplant status: Secondary | ICD-10-CM

## 2021-06-06 DIAGNOSIS — M7989 Other specified soft tissue disorders: Secondary | ICD-10-CM | POA: Diagnosis not present

## 2021-06-06 DIAGNOSIS — M79642 Pain in left hand: Secondary | ICD-10-CM

## 2021-06-06 DIAGNOSIS — G629 Polyneuropathy, unspecified: Secondary | ICD-10-CM

## 2021-06-06 DIAGNOSIS — G8929 Other chronic pain: Secondary | ICD-10-CM

## 2021-06-06 DIAGNOSIS — M25512 Pain in left shoulder: Secondary | ICD-10-CM

## 2021-06-06 DIAGNOSIS — M1A3720 Chronic gout due to renal impairment, left ankle and foot, without tophus (tophi): Secondary | ICD-10-CM

## 2021-06-06 DIAGNOSIS — M79641 Pain in right hand: Secondary | ICD-10-CM | POA: Diagnosis not present

## 2021-06-06 DIAGNOSIS — N186 End stage renal disease: Secondary | ICD-10-CM | POA: Diagnosis not present

## 2021-06-06 IMAGING — DX DG CHEST 2V
2 series · 2 of 2 positions shown · non-contrast
Comparison: PA and lateral chest 08/25/2018.

CLINICAL DATA: Worsening kidney function, weight gain and swelling.

EXAM:
CHEST - 2 VIEW

[chest lat]
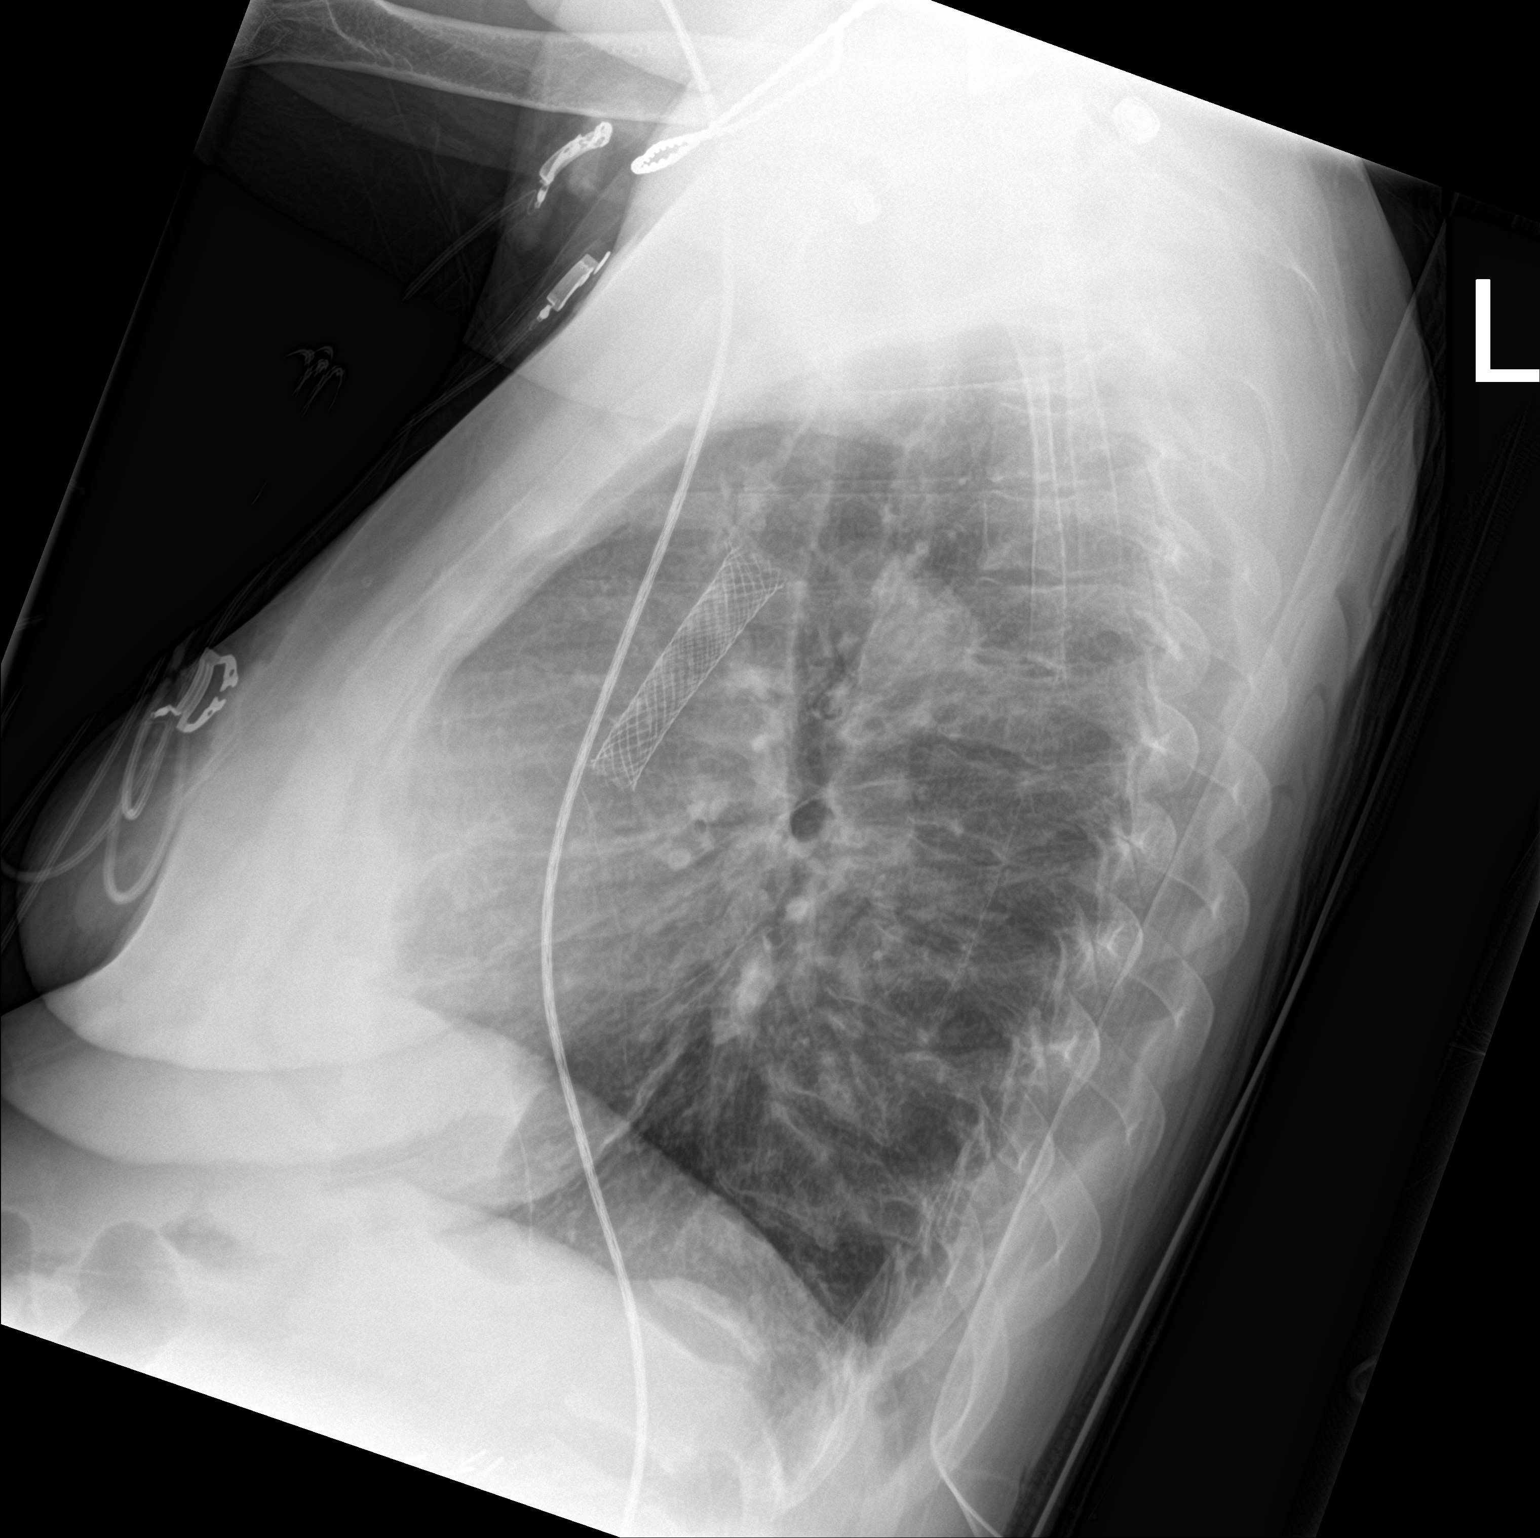

[chest ap]
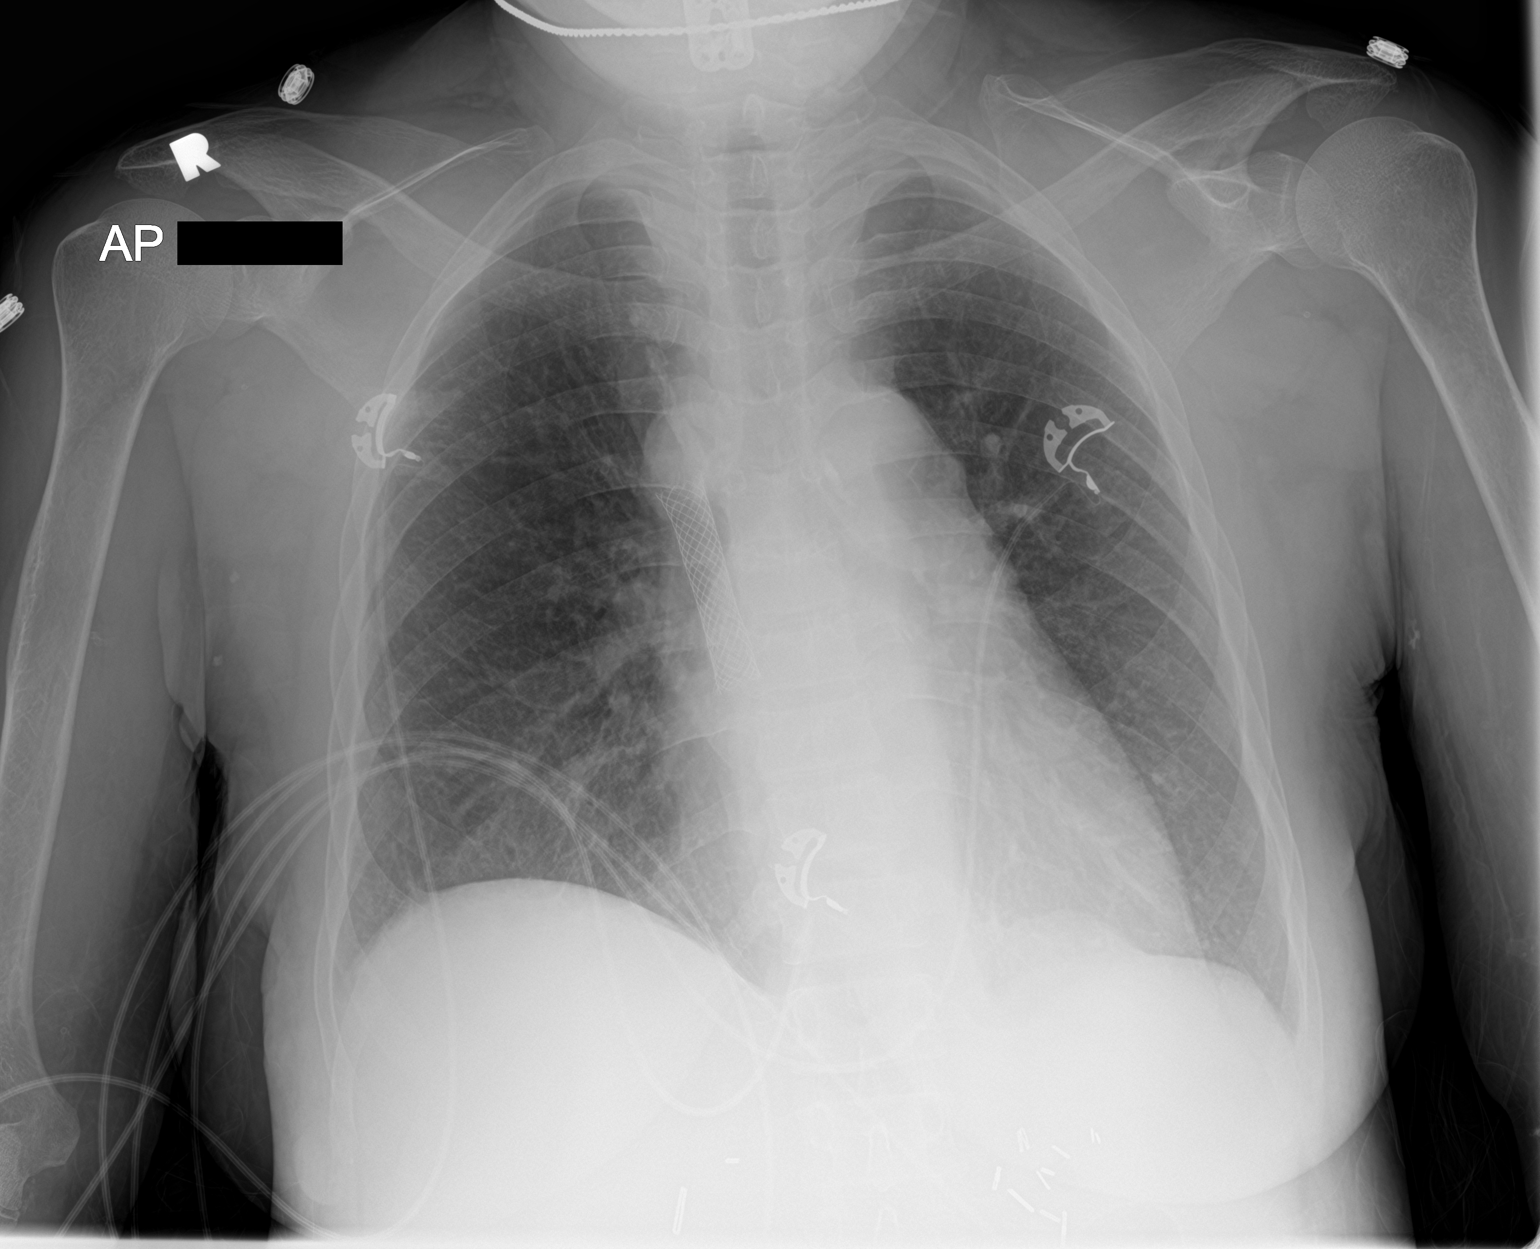

[2 of 2 positions shown; findings below may reference images not displayed]

FINDINGS: Vascular stent in the superior vena cava is identified. Heart size
is normal. No consolidative process, edema, pneumothorax or pleural
effusion. No acute or focal bony abnormality.
IMPRESSION: No acute disease.

## 2021-06-06 IMAGING — US US ABDOMEN COMPLETE
1 series · 13 of 25 positions shown · non-contrast
Comparison: 08/09/2017, 08/25/2018

CLINICAL DATA: Renal transplant, bilateral nephrectomy, ascites,
acute renal insufficiency

EXAM:
ABDOMEN ULTRASOUND COMPLETE

[Series 1: us abdomen complete · 13 of 100 slices shown]
[im 1/100]
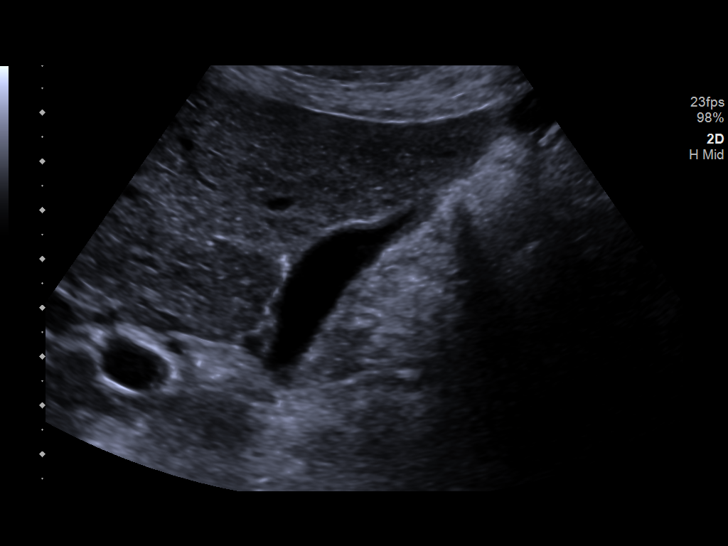
[im 9/100]
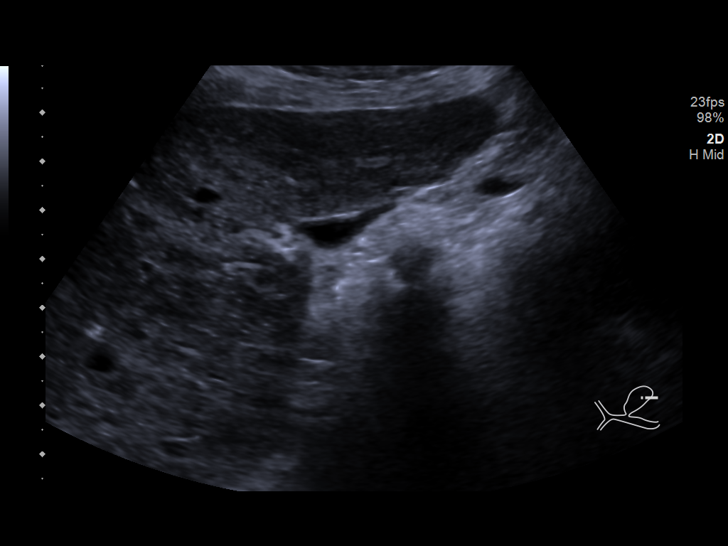
[im 17/100]
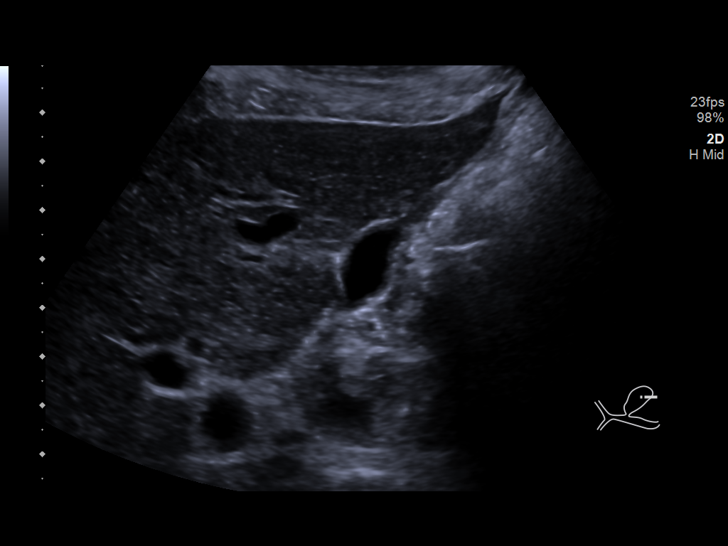
[im 25/100]
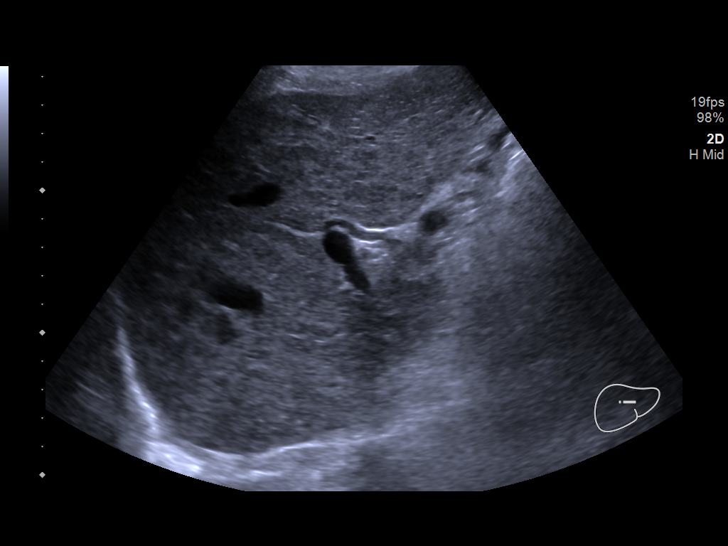
[im 34/100]
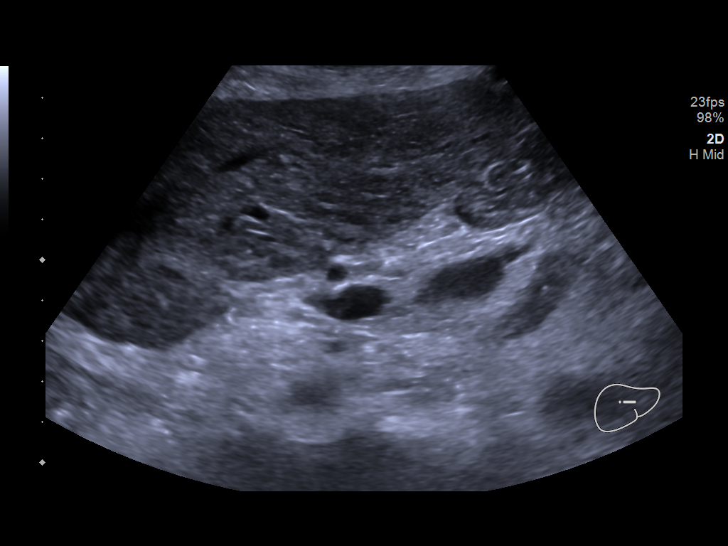
[im 42/100]
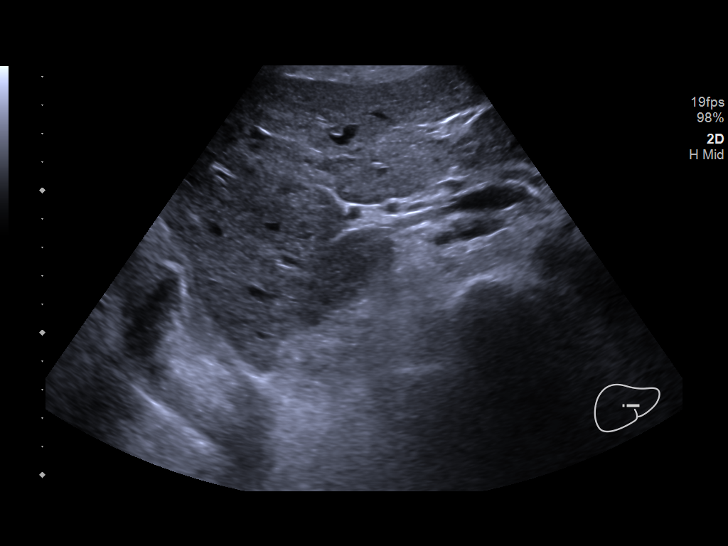
[im 50/100]
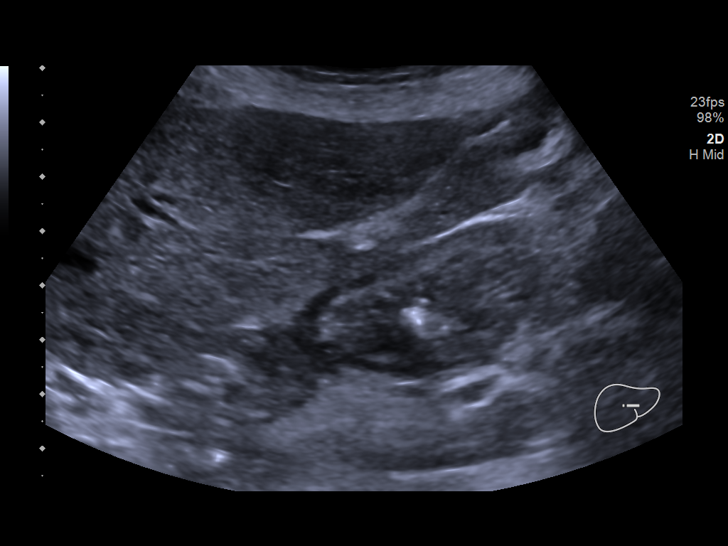
[im 58/100]
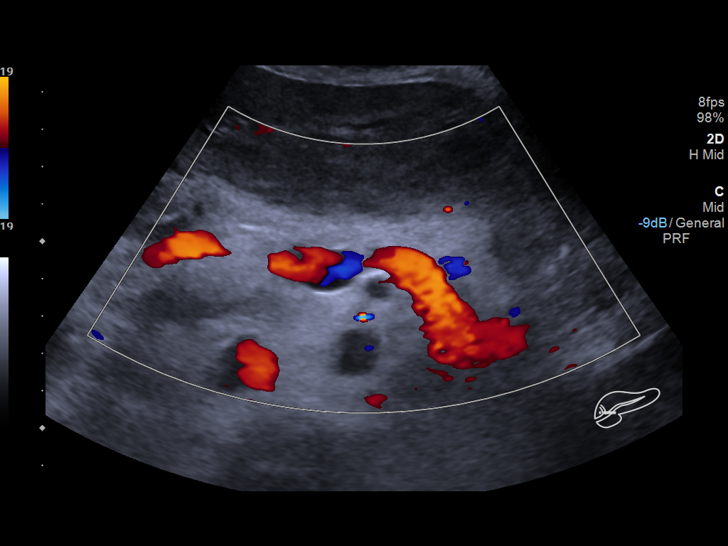
[im 67/100]
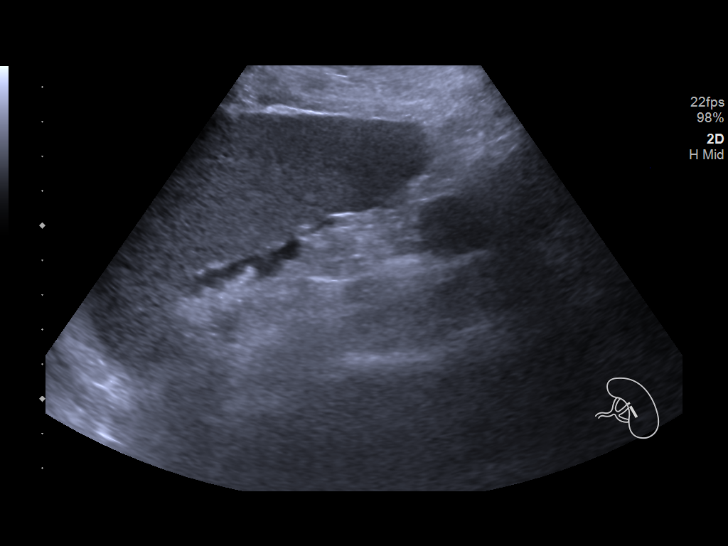
[im 75/100]
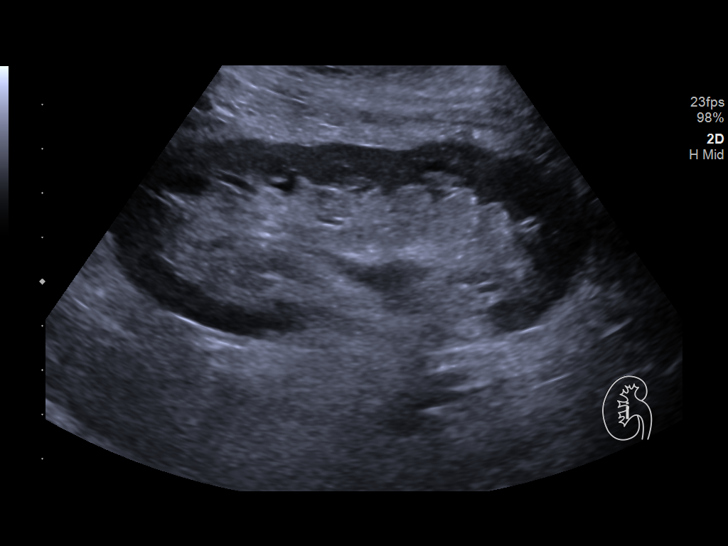
[im 83/100]
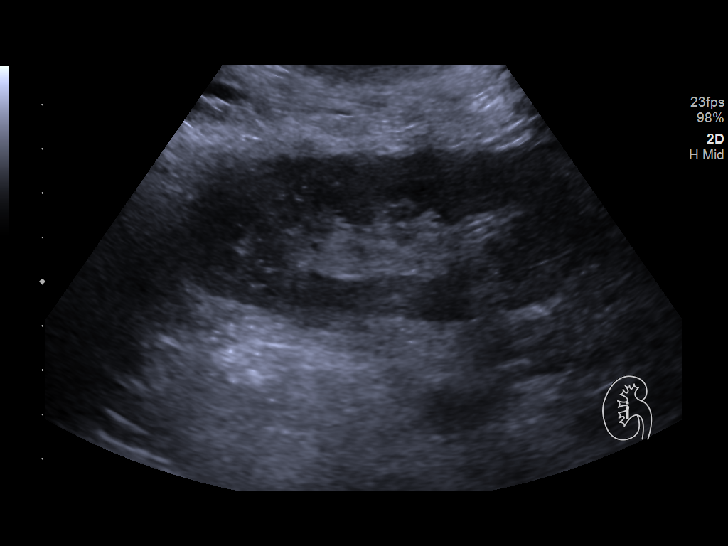
[im 91/100]
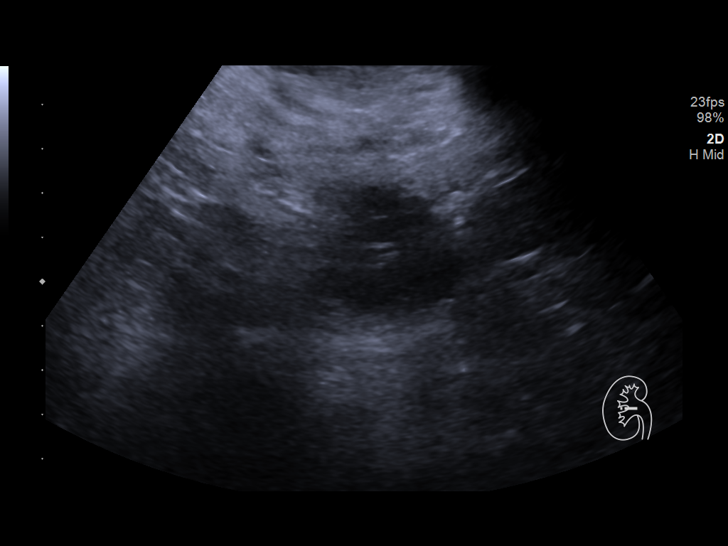
[im 100/100]
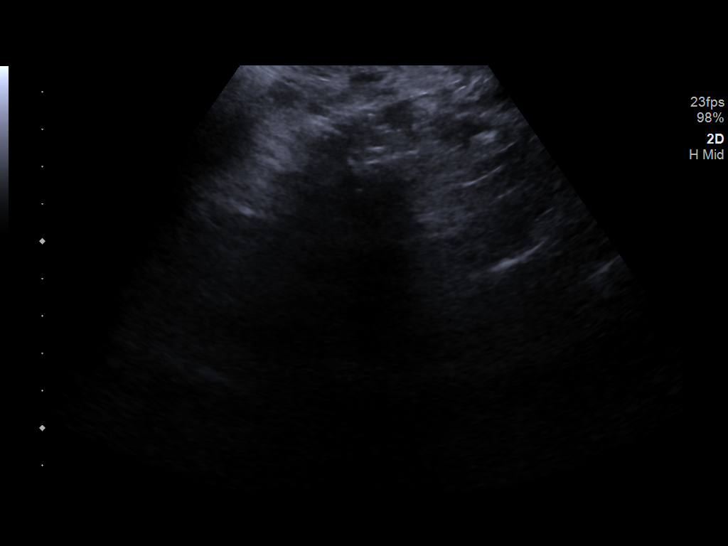

[13 of 25 positions shown; findings below may reference images not displayed]

FINDINGS: Gallbladder: No gallstones or wall thickening visualized. No
sonographic Murphy sign noted by sonographer.

Common bile duct: Diameter: 3 mm

Liver: There is mild nodularity of the liver capsule, with coarsened
increased echotexture likely representing cirrhosis. No focal liver
abnormalities. Portal vein is patent on color Doppler imaging with
normal direction of blood flow towards the liver.

IVC: No abnormality visualized.

Pancreas: Visualized portion unremarkable.

Spleen: Size and appearance within normal limits.

Right Kidney: Surgically absent

Left Kidney: Length: Surgically absent

Transplant kidney: Located in the right lower quadrant, 10.3 x
by 4.7 cm. 120 cc. Normal echotexture without focal mass.

Abdominal aorta: No aneurysm visualized.

Other findings: None.
IMPRESSION: 1. Unremarkable right lower quadrant transplant kidney in a patient
with a history of bilateral nephrectomies.
2. Heterogeneous liver echotexture, with nodular liver capsule
suggesting cirrhosis.
3. Otherwise unremarkable exam.

## 2021-06-07 LAB — URIC ACID: Uric Acid, Serum: 3.2 mg/dL (ref 2.5–7.0)

## 2021-06-18 ENCOUNTER — Other Ambulatory Visit: Payer: Self-pay | Admitting: Nurse Practitioner

## 2021-06-20 NOTE — Progress Notes (Signed)
Uric acid level was quite low at 3.2 I don't think allopurinol needs to be adjusted. She does have some joint erosion damage on xray that could indicate an inflammatory issue. We should schedule a follow up appointment to take another look and discuss possible treatment.

## 2021-07-02 ENCOUNTER — Ambulatory Visit (INDEPENDENT_AMBULATORY_CARE_PROVIDER_SITE_OTHER): Payer: Medicare Other | Admitting: Pulmonary Disease

## 2021-07-02 ENCOUNTER — Encounter: Payer: Self-pay | Admitting: Pulmonary Disease

## 2021-07-02 VITALS — BP 122/74 | HR 71 | Ht <= 58 in | Wt 114.0 lb

## 2021-07-02 DIAGNOSIS — J449 Chronic obstructive pulmonary disease, unspecified: Secondary | ICD-10-CM

## 2021-07-02 DIAGNOSIS — R059 Cough, unspecified: Secondary | ICD-10-CM

## 2021-07-02 DIAGNOSIS — F17210 Nicotine dependence, cigarettes, uncomplicated: Secondary | ICD-10-CM

## 2021-07-02 LAB — PULMONARY FUNCTION TEST
DL/VA % pred: 82 %
DL/VA: 3.76 ml/min/mmHg/L
DLCO cor % pred: 72 %
DLCO cor: 11.45 ml/min/mmHg
DLCO unc % pred: 72 %
DLCO unc: 11.45 ml/min/mmHg
FEF 25-75 Post: 1.58 L/sec
FEF 25-75 Pre: 1.39 L/sec
FEF2575-%Change-Post: 13 %
FEF2575-%Pred-Post: 85 %
FEF2575-%Pred-Pre: 75 %
FEV1-%Change-Post: 0 %
FEV1-%Pred-Post: 83 %
FEV1-%Pred-Pre: 83 %
FEV1-Post: 1.36 L
FEV1-Pre: 1.36 L
FEV1FVC-%Change-Post: 7 %
FEV1FVC-%Pred-Pre: 103 %
FEV6-%Change-Post: -7 %
FEV6-%Pred-Post: 77 %
FEV6-%Pred-Pre: 83 %
FEV6-Post: 1.51 L
FEV6-Pre: 1.63 L
FEV6FVC-%Pred-Post: 103 %
FEV6FVC-%Pred-Pre: 103 %
FVC-%Change-Post: -7 %
FVC-%Pred-Post: 74 %
FVC-%Pred-Pre: 79 %
FVC-Post: 1.51 L
FVC-Pre: 1.63 L
Post FEV1/FVC ratio: 90 %
Post FEV6/FVC ratio: 100 %
Pre FEV1/FVC ratio: 83 %
Pre FEV6/FVC Ratio: 100 %
RV % pred: 145 %
RV: 2.14 L
TLC % pred: 90 %
TLC: 3.54 L

## 2021-07-02 NOTE — Patient Instructions (Signed)
Performed Full PFT Today.   ?

## 2021-07-02 NOTE — Progress Notes (Signed)
Synopsis: Referred in March 2023 for COPD by Dionisio David, NP  Subjective:   PATIENT ID: Tina Watkins GENDER: female DOB: August 12, 1965, MRN: [id number]   HPI  Chief Complaint  Patient presents with   Follow-up    3 mo f/u after PFT. Has not started the Advair yet.    [name] is a 56 year old woman, daily smoker with ESRD on HD, GERD, atrial fibrillation, CHF and CVA who returns to pulmonary clinic for COPD.   She was prescribed advair 115-60mg 2 puffs twice daily at last visit but never picked up her prescription.  She continues to smoke daily.   PFTs show air trapping and mild diffusion defect.   OV 04/28/21 She was recently treated for COPD exacerbation in the ER on 03/30/21. She has been doing better since then. She reports cough with pleghm production daily. She does not have any chest tightness of wheezing. She is using albuterol inhaler 1-2 times per day which helps with her cough. She reports significant shortness of breath with exertion which limits her activity level.   She is on disability. She has been smoking for 32 years, currently smoking half a pack per day but was smoking up to a pack previously.   Past Medical History:  Diagnosis Date   Bacteremia due to Gram-negative bacteria 05/23/2011   Blind    right eye   Blind right eye    CHF (congestive heart failure) (HCC)    Chronic lower back pain    Complication of anesthesia    "woke up during OR; I have an extremely high tolerance" (12/11/2011) 1 procedure was graft; the other procedures were procedures that are typically done with sedation.   DDD (degenerative disc disease), cervical    Depression    Dysrhythmia    "tachycardia" (12/11/2011) new onset afib 10/15/14 EKG   E coli bacteremia 06/18/2011   Elevated LDL cholesterol level 12/2018   [place] [place]/12[place]   Fibromyalgia    Gastroparesis    Gastroparesis    GERD (gastroesophageal reflux disease)    Glaucoma     right eye   Gout    H/O carpal tunnel syndrome    Headache 10/2019   Headache(784.0)    "not often anymore" (12/11/2011)   Herpes genitalia 1994   History of blood transfusion    "more than a few times" (12/11/2011)   History of stomach ulcers    Hypotension    Iron deficiency anemia    [place] onset [place]fib [place]    10/15/14 EKG   Osteopenia    Peripheral neuropathy 11/2018   Pressure ulcer of sacral region, stage 1 07/2019   Seizures ([facility] 1994   "post transplant; only have had that one" (12/11/2011)   Spinal stenosis in cervical region    Stroke ([facility]     left basal ganglia lacunar infarct; Right frontal lobe lacunar infarct.   Stroke ([facility] ~ 1999; 2001   "briefly lost my vision; lost my right eye" (12/11/2011)   Vitamin D deficiency 12/2018     Family History  Problem Relation Age of Onset   Glaucoma Mother    Pancreatic cancer Father    Multiple sclerosis Brother    Diabetes Maternal Uncle    Hypertension Maternal Grandmother    Breast cancer Neg Hx      Social History   Socioeconomic History   Marital status: Single    Spouse name: Not on file   Number of  children: 0   Years of education: some college   Highest education level: Some college, no degree  Occupational History   Occupation: disabled    Fish farm manager: UNEMPLOYED  Tobacco Use   Smoking status: Every Day    Packs/day: 0.33    Years: 35.00    Pack years: 11.55    Types: Cigarettes    Passive exposure: Never   Smokeless tobacco: Never   Tobacco comments:    Pack of cigarettes lasts 2 days 09/18/2020  Vaping Use   Vaping Use: Never used  Substance and Sexual Activity   Alcohol use: Not Currently    Alcohol/week: 1.0 standard drink    Types: 1 Standard drinks or equivalent per week    Comment: rare if out to dinner   Drug use: Not Currently    Frequency: 1.0 times per week    Types: Marijuana    Comment: smoking once a day when in severe pain   Sexual activity: Not Currently    Partners: Male     Birth control/protection: None  Other Topics Concern   Not on file  Social History Narrative   Right-handed.   Four cups caffeine per day.   Lives alone.   Social Determinants of Health   Financial Resource Strain: Low Risk    Difficulty of Paying Living Expenses: Not hard at all  Food Insecurity: No Food Insecurity   Worried About Charity fundraiser in the Last Year: Never true   [place] in the Last Year: Never true  Transportation Needs: No Transportation Needs   Lack of Transportation (Medical): No   Lack of Transportation (Non-Medical): No  Physical Activity: Insufficiently Active   Days of Exercise per Week: 4 days   Minutes of Exercise per Session: 20 min  Stress: Stress Concern Present   Feeling of Stress : Rather much  Social Connections: Moderately Isolated   Frequency of Communication with Friends and Family: Three times a week   Frequency of Social Gatherings with Friends and Family: Once a week   Attends Religious Services: 1 to 4 times per year   Active Member of [employer] or Organizations: No   Attends Music therapist: Never   Marital Status: Never married  Human resources officer Violence: Not At Risk   Fear of Current or Ex-Partner: No   Emotionally Abused: No   Physically Abused: No   Sexually Abused: No     Allergies  Allergen Reactions   Levofloxacin Itching and Rash   Tape Other (See Comments)    "Certain surgical tapes peel off my skin"     Outpatient Medications Prior to Visit  Medication Sig Dispense Refill   albuterol (VENTOLIN HFA) 108 (90 Base) MCG/ACT inhaler Inhale 1-2 puffs into the lungs every 6 (six) hours as needed for wheezing or shortness of breath.     allopurinol (ZYLOPRIM) 100 MG tablet TAKE 1 TABLET BY MOUTH EVERY DAY 90 tablet 3   amiodarone (PACERONE) 200 MG tablet Take 1 tablet (200 mg total) by mouth daily. 30 tablet 3   apixaban (ELIQUIS) 2.5 MG TABS tablet Take 1 tablet (2.5 mg total) by mouth 2 (two) times  daily. 60 tablet 3   B Complex-C-Zn-Folic Acid (DIALYVITE 176-HYWV 15) 0.8 MG TABS Take 1 tablet by mouth daily.     busPIRone (BUSPAR) 10 MG tablet Take 5 mg by mouth 2 (two) times daily.     calcium acetate (PHOSLO) 667 MG capsule Take 667 mg by mouth  See admin instructions. Take 1 capsule (667 mg) by mouth with each meal and with each snack     Colchicine 0.6 MG CAPS Take 0.6 mg by mouth daily as needed (gout flare up).     diclofenac Sodium (VOLTAREN) 1 % GEL APPLY 2 GRAMS TO AFFECTED AREA 4 TIMES A DAY 200 g 2   diphenhydrAMINE (BENADRYL) 25 mg capsule Take 1 capsule (25 mg total) by mouth every 8 (eight) hours as needed. 30 capsule 6   escitalopram (LEXAPRO) 20 MG tablet Take 20 mg by mouth daily.     fluticasone (FLONASE) 50 MCG/ACT nasal spray Place 1 spray into both nostrils daily as needed for allergies or rhinitis.     fluticasone-salmeterol (ADVAIR HFA) 115-21 MCG/ACT inhaler Inhale 2 puffs into the lungs 2 (two) times daily. 1 each 12   gabapentin (NEURONTIN) 100 MG capsule TAKE 1 CAPSULE BY MOUTH TWICE A DAY MAX 2 CAPSULES DAILY 60 capsule 1   iron sucrose in sodium chloride 0.9 % 100 mL Iron Sucrose (Venofer)     latanoprost (XALATAN) 0.005 % ophthalmic solution Place 1 drop into the left eye at bedtime.  3   lidocaine (LIDODERM) 5 % Place 1 patch onto the skin daily as needed (for pain- remove old patch first).      Methoxy PEG-Epoetin Beta (MIRCERA IJ) Mircera     metoCLOPramide (REGLAN) 10 MG tablet Take 1 tablet (10 mg total) by mouth every 8 (eight) hours as needed for nausea. 90 tablet 0   metoCLOPramide (REGLAN) 5 MG tablet TAKE 1 TABLET (5 MG TOTAL) BY MOUTH 2 (TWO) TIMES DAILY BEFORE A MEAL. 180 tablet 3   midodrine (PROAMATINE) 10 MG tablet Take 1.5 tablets (15 mg total) by mouth 3 (three) times daily. 405 tablet 3   nicotine (NICODERM CQ) 21 mg/24hr patch Place 1 patch (21 mg total) onto the skin daily. 90 patch 3   omeprazole (PRILOSEC) 40 MG capsule TAKE 1 CAPSULE (40  MG TOTAL) BY MOUTH DAILY. 90 capsule 1   ondansetron (ZOFRAN-ODT) 4 MG disintegrating tablet Take 1 tablet (4 mg total) by mouth every 8 (eight) hours as needed for nausea or vomiting. 20 tablet 11   oxyCODONE-acetaminophen (PERCOCET) 10-325 MG tablet Take 1 tablet by mouth 4 (four) times daily as needed for pain.     Vitamin D, Ergocalciferol, (DRISDOL) 1.25 MG (50000 UNIT) CAPS capsule TAKE 1 CAPSULE (50,000 UNITS TOTAL) BY MOUTH EVERY 7 (SEVEN) DAYS 5 capsule 6   zolpidem (AMBIEN) 10 MG tablet Take 1 tablet (10 mg total) by mouth at bedtime as needed for sleep. 10 tablet 0   No facility-administered medications prior to visit.   Review of Systems  Constitutional:  Negative for chills, fever, malaise/fatigue and weight loss.  HENT:  Negative for congestion, sinus pain and sore throat.   Eyes: Negative.   Respiratory:  Positive for shortness of breath. Negative for cough, hemoptysis, sputum production and wheezing.   Cardiovascular:  Negative for chest pain, palpitations, orthopnea, claudication and leg swelling.  Gastrointestinal:  Negative for abdominal pain, heartburn, nausea and vomiting.  Genitourinary: Negative.   Musculoskeletal:  Negative for joint pain and myalgias.  Skin:  Negative for rash.  Neurological:  Negative for weakness.  Endo/Heme/Allergies: Negative.   Psychiatric/Behavioral:  Positive for depression. The patient is nervous/anxious.    Objective:   Vitals:   07/02/21 1207  BP: 122/74  Pulse: 71  SpO2: 97%  Weight: 114 lb (51.7 kg)  Height: 4' 8.5" (1.435  m)   Physical Exam Constitutional:      General: She is not in acute distress.    Appearance: She is not ill-appearing.  HENT:     Head: Normocephalic and atraumatic.  Eyes:     General: No scleral icterus.    Conjunctiva/sclera: Conjunctivae normal.  Cardiovascular:     Rate and Rhythm: Normal rate and regular rhythm.     Pulses: Normal pulses.     Heart sounds: Murmur heard.  Pulmonary:      Effort: Pulmonary effort is normal.     Breath sounds: Normal breath sounds. No wheezing, rhonchi or rales.  Musculoskeletal:     Right lower leg: No edema.     Left lower leg: No edema.  Skin:    General: Skin is warm and dry.  Neurological:     General: No focal deficit present.     Mental Status: She is alert.  Psychiatric:        Mood and Affect: Mood normal.        Behavior: Behavior normal.        Thought Content: Thought content normal.        Judgment: Judgment normal.   CBC    Component Value Date/Time   WBC 4.2 03/30/2021 0915   RBC 4.43 03/30/2021 0915   HGB 11.2 (L) 03/30/2021 0915   HGB 10.1 (L) 10/13/2019 0916   HGB 6.7 (LL) 05/08/2019 0854   HCT 38.1 03/30/2021 0915   HCT 25.7 (L) 07/08/2019 1446   PLT 156 03/30/2021 0915   PLT 170 10/13/2019 0916   PLT 170 05/08/2019 0854   MCV 86.0 03/30/2021 0915   MCV 98 (H) 05/08/2019 0854   MCH 25.3 (L) 03/30/2021 0915   MCHC 29.4 (L) 03/30/2021 0915   RDW 22.1 (H) 03/30/2021 0915   RDW 21.9 (H) 05/08/2019 0854   LYMPHSABS 0.9 03/30/2021 0915   MONOABS 0.3 03/30/2021 0915   EOSABS 0.1 03/30/2021 0915   BASOSABS 0.0 03/30/2021 0915      Latest Ref Rng & Units 03/30/2021    9:15 AM 03/26/2021    9:40 AM 02/08/2021   10:49 AM  BMP  Glucose 70 - 99 mg/dL 76   83   79    BUN 6 - 20 mg/dL '10   10   29    '$ Creatinine 0.44 - 1.00 mg/dL 3.49   5.25   6.99    Sodium 135 - 145 mmol/L 137   134   130    Potassium 3.5 - 5.1 mmol/L 3.0   3.7   4.5    Chloride 98 - 111 mmol/L 94   92   92    CO2 22 - 32 mmol/L '24   28   21    '$ Calcium 8.9 - 10.3 mg/dL 9.5   9.6   9.6     Chest imaging: CXR 03/30/21 Increased markings in both lower lung fields may suggest atelectasis/pneumonia with no significant interval change. Possible minimal left pleural effusion.  PFT:    Latest Ref Rng & Units 07/02/2021   11:02 AM  PFT Results  FVC-Pre L 1.63  P  FVC-Predicted Pre % 79  P  FVC-Post L 1.51  P  FVC-Predicted Post % 74  P  Pre  FEV1/FVC % % 83  P  Post FEV1/FCV % % 90  P  FEV1-Pre L 1.36  P  FEV1-Predicted Pre % 83  P  FEV1-Post L 1.36  P  DLCO uncorrected  ml/min/mmHg 11.45  P  DLCO UNC% % 72  P  DLCO corrected ml/min/mmHg 11.45  P  DLCO COR %Predicted % 72  P  DLVA Predicted % 82  P  TLC L 3.54  P  TLC % Predicted % 90  P  RV % Predicted % 145  P    P Preliminary result    Labs:  Path:  Echo 03/29/20: LV EF 65-70%. Normal diastolic function. RV systolic function and size are normal.   Heart Catheterization:  Assessment & Plan:   Chronic obstructive pulmonary disease, unspecified COPD type ([place])  Cigarette smoker - Plan: Ambulatory Referral for Lung Cancer Scre  Discussion: Arelyn Gauer is a 56 year old woman, daily smoker with ESRD on HD, GERD, atrial fibrillation, CHF and CVA who returns to pulmonary clinic for COPD.   She is to start her on advair 115-20mg 2 puffs twice daily and she can continue albuterol inhaler as needed.   We discussed smoking cessation with nicotine replacement therapy. She is to use '7mg'$  daily nicotine patches along with as needed mini nicotine lozenges. We will refer her to our lung cancer screening program.  She is to follow up in 6 months.  JFreda Jackson MD [facility] Office: [phone]  Current Outpatient Medications:    albuterol (VENTOLIN HFA) 108 (90 Base) MCG/ACT inhaler, Inhale 1-2 puffs into the lungs every 6 (six) hours as needed for wheezing or shortness of breath., Disp: , Rfl:    allopurinol (ZYLOPRIM) 100 MG tablet, TAKE 1 TABLET BY MOUTH EVERY DAY, Disp: 90 tablet, Rfl: 3   amiodarone (PACERONE) 200 MG tablet, Take 1 tablet (200 mg total) by mouth daily., Disp: 30 tablet, Rfl: 3   apixaban (ELIQUIS) 2.5 MG TABS tablet, Take 1 tablet (2.5 mg total) by mouth 2 (two) times daily., Disp: 60 tablet, Rfl: 3   B Complex-C-Zn-Folic Acid (DIALYVITE 8098-JXBJ15) 0.8 MG TABS, Take 1 tablet by mouth daily., Disp: , Rfl:     busPIRone (BUSPAR) 10 MG tablet, Take 5 mg by mouth 2 (two) times daily., Disp: , Rfl:    calcium acetate (PHOSLO) 667 MG capsule, Take 667 mg by mouth See admin instructions. Take 1 capsule (667 mg) by mouth with each meal and with each snack, Disp: , Rfl:    Colchicine 0.6 MG CAPS, Take 0.6 mg by mouth daily as needed (gout flare up)., Disp: , Rfl:    diclofenac Sodium (VOLTAREN) 1 % GEL, APPLY 2 GRAMS TO AFFECTED AREA 4 TIMES A DAY, Disp: 200 g, Rfl: 2   diphenhydrAMINE (BENADRYL) 25 mg capsule, Take 1 capsule (25 mg total) by mouth every 8 (eight) hours as needed., Disp: 30 capsule, Rfl: 6   escitalopram (LEXAPRO) 20 MG tablet, Take 20 mg by mouth daily., Disp: , Rfl:    fluticasone (FLONASE) 50 MCG/ACT nasal spray, Place 1 spray into both nostrils daily as needed for allergies or rhinitis., Disp: , Rfl:    fluticasone-salmeterol (ADVAIR HFA) 115-21 MCG/ACT inhaler, Inhale 2 puffs into the lungs 2 (two) times daily., Disp: 1 each, Rfl: 12   gabapentin (NEURONTIN) 100 MG capsule, TAKE 1 CAPSULE BY MOUTH TWICE A DAY MAX 2 CAPSULES DAILY, Disp: 60 capsule, Rfl: 1   iron sucrose in sodium chloride 0.9 % 100 mL, Iron Sucrose (Venofer), Disp: , Rfl:    latanoprost (XALATAN) 0.005 % ophthalmic solution, Place 1 drop into the left eye at bedtime., Disp: , Rfl: 3   lidocaine (LIDODERM) 5 %,  Place 1 patch onto the skin daily as needed (for pain- remove old patch first). , Disp: , Rfl:    Methoxy PEG-Epoetin Beta (MIRCERA IJ), Mircera, Disp: , Rfl:    metoCLOPramide (REGLAN) 10 MG tablet, Take 1 tablet (10 mg total) by mouth every 8 (eight) hours as needed for nausea., Disp: 90 tablet, Rfl: 0   metoCLOPramide (REGLAN) 5 MG tablet, TAKE 1 TABLET (5 MG TOTAL) BY MOUTH 2 (TWO) TIMES DAILY BEFORE A MEAL., Disp: 180 tablet, Rfl: 3   midodrine (PROAMATINE) 10 MG tablet, Take 1.5 tablets (15 mg total) by mouth 3 (three) times daily., Disp: 405 tablet, Rfl: 3   nicotine (NICODERM CQ) 21 mg/24hr patch, Place 1 patch  (21 mg total) onto the skin daily., Disp: 90 patch, Rfl: 3   omeprazole (PRILOSEC) 40 MG capsule, TAKE 1 CAPSULE (40 MG TOTAL) BY MOUTH DAILY., Disp: 90 capsule, Rfl: 1   ondansetron (ZOFRAN-ODT) 4 MG disintegrating tablet, Take 1 tablet (4 mg total) by mouth every 8 (eight) hours as needed for nausea or vomiting., Disp: 20 tablet, Rfl: 11   oxyCODONE-acetaminophen (PERCOCET) 10-325 MG tablet, Take 1 tablet by mouth 4 (four) times daily as needed for pain., Disp: , Rfl:    Vitamin D, Ergocalciferol, (DRISDOL) 1.25 MG (50000 UNIT) CAPS capsule, TAKE 1 CAPSULE (50,000 UNITS TOTAL) BY MOUTH EVERY 7 (SEVEN) DAYS, Disp: 5 capsule, Rfl: 6   zolpidem (AMBIEN) 10 MG tablet, Take 1 tablet (10 mg total) by mouth at bedtime as needed for sleep., Disp: 10 tablet, Rfl: 0

## 2021-07-02 NOTE — Patient Instructions (Addendum)
Start advair inhaler 2 puffs twice daily - rinse mouth out after each use   Continue to use albuterol inhaler as needed   Recommend quitting smoking using nicotine replacement therapy  - use nicotine patches '7mg'$  daily - use mini nicotine lozenges as needed   We will refer you to our lung cancer screening program  Follow up in 6 months

## 2021-07-02 NOTE — Progress Notes (Signed)
Full PFT Performed Today  

## 2021-07-03 ENCOUNTER — Encounter (HOSPITAL_COMMUNITY): Payer: Self-pay | Admitting: *Deleted

## 2021-07-04 IMAGING — DX DG CHEST 2V
2 series · 2 of 2 positions shown · non-contrast
Comparison: March 20, 2019.

CLINICAL DATA: Dyspnea and weakness.

EXAM:
CHEST - 2 VIEW

[chest lat]
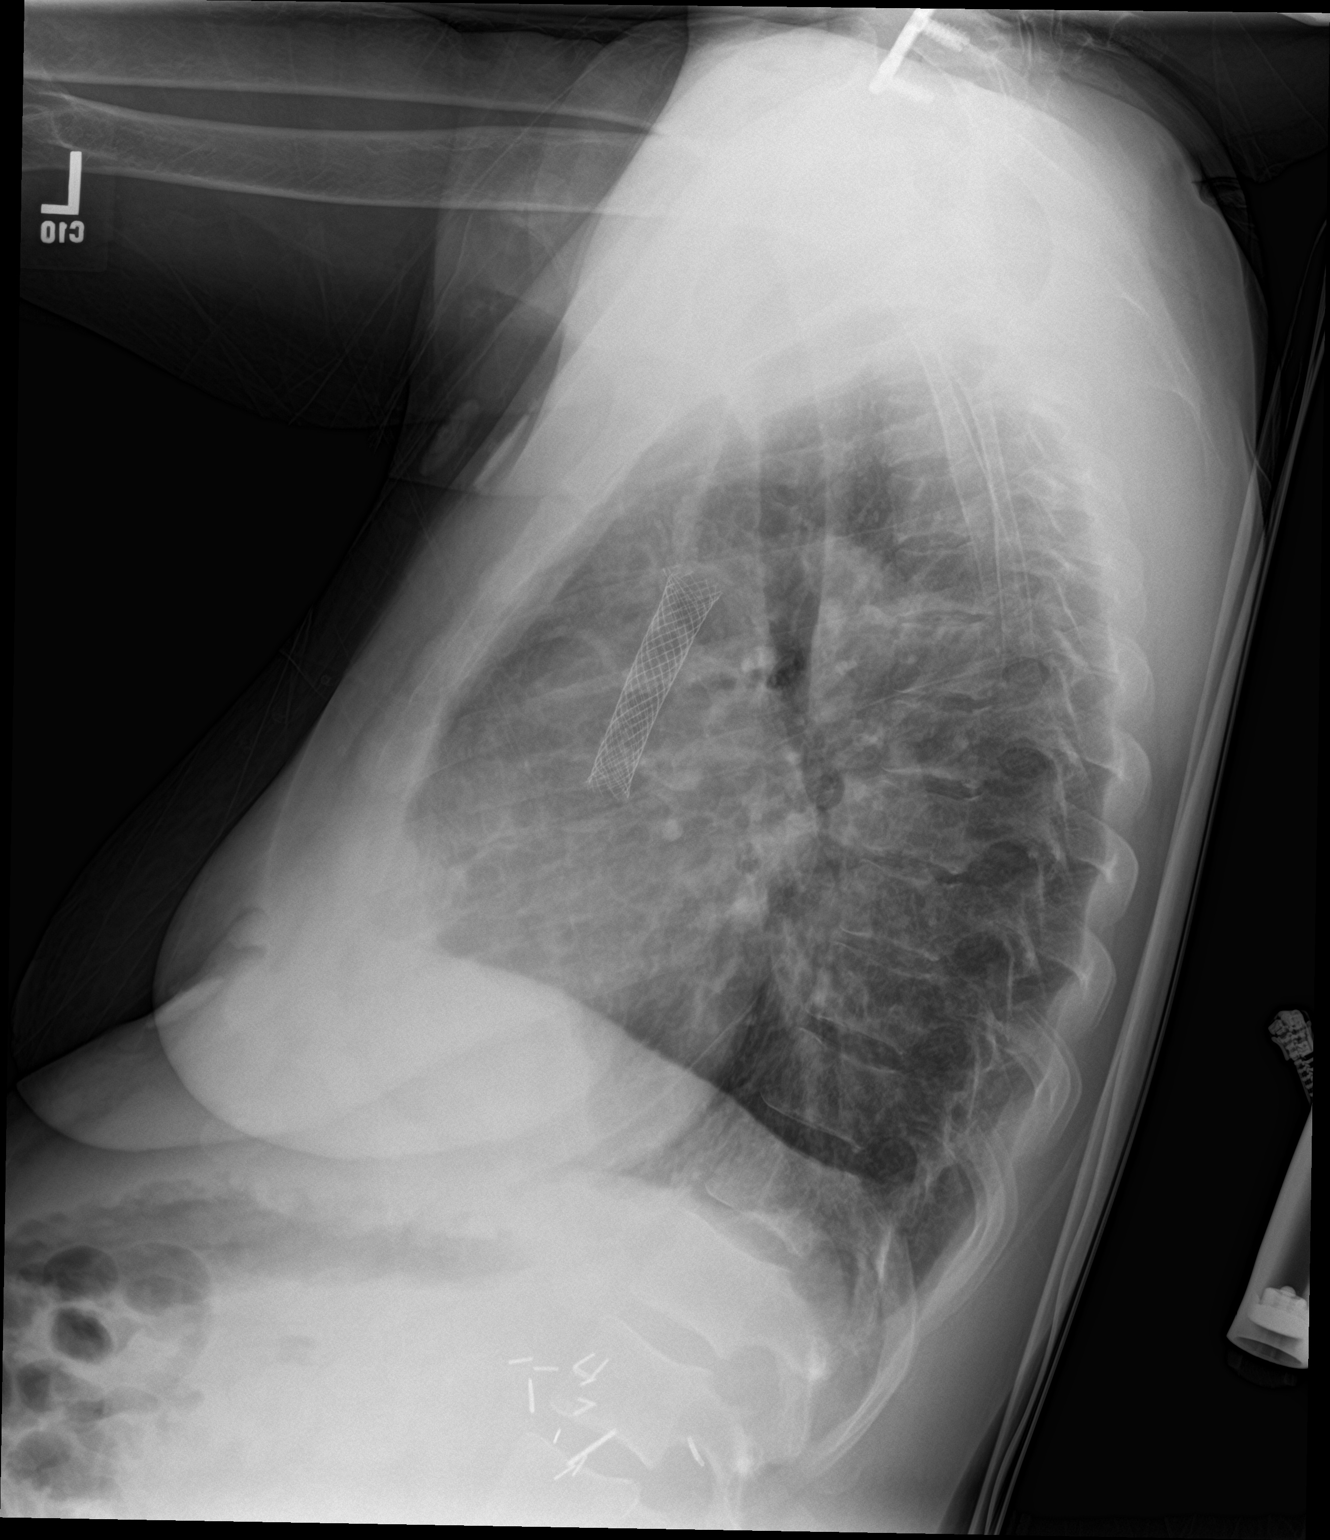

[chest ap]
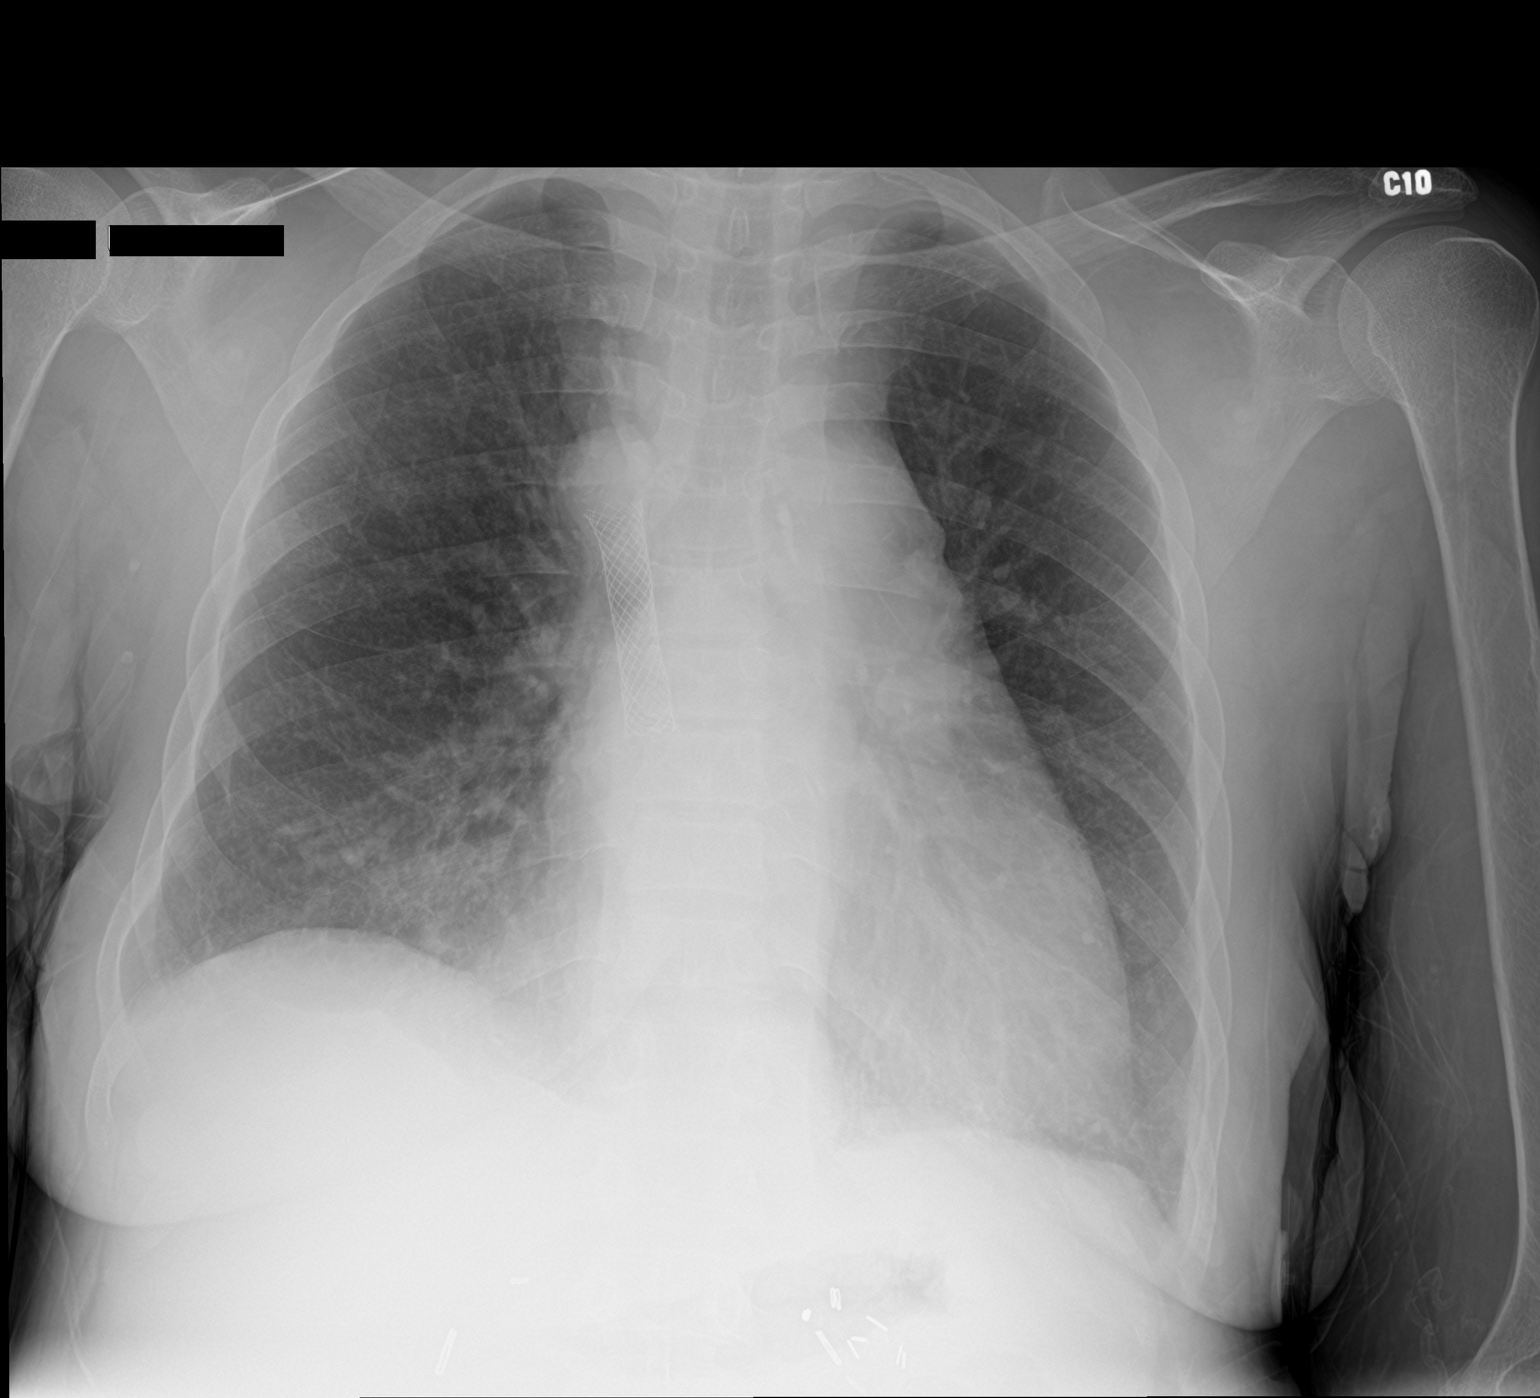

[2 of 2 positions shown; findings below may reference images not displayed]

FINDINGS: There are stable mild cardiomegaly and superior vena cava stent
graft placement. Increased interstitial opacity projects on the
bilateral infrahilar regions with mild bronchial wall thickening,
but without obscuration of the diaphragm or heart borders. There is
persistent interstitial edema. There is no pneumothorax or obvious
pleural effusion. Aortic knob calcified atherosclerosis and
postsurgical change of lower cervical ACDF are present. Surgical
clips project on the upper abdomen.
IMPRESSION: Increased bilateral infrahilar bronchitis with nonspecific small
airways disease which may be inflammatory, infectious or
aspiration-related. No pneumonia.

Stable cardiomegaly and postoperative changes with subtle pulmonary
edema but no obvious pleural effusion.

## 2021-07-04 NOTE — Progress Notes (Signed)
Office Visit Note  Patient: Tina Watkins             Date of Birth: 01-25-1966           MRN: [id number]             PCP: Teena Dunk, NP Referring: Teena Dunk, NP Visit Date: 07/07/2021   Subjective:  Follow-up (Bil hand numbness, pain and swelling)   History of Present Illness: Tina Watkins is a 56 y.o. female here for follow up for evaluation of pain with history of neuropathy, gout, osteoarthritis, and possible fibromyalgia syndrome.  At initial visit uric acid level was 3.2 suspect gout is well controlled at this time. Imaging of hands and shoulders did not show significant shoulder arthritis but hands with chronic changes including probable small distal erosions. She continues to have a lot of pain also feeling numbness and swelling involving both hands. She is unable to easily reach up or behind her back, also with increased pain lying on her side provokes worse numbness to that side.  Previous HPI 06/06/2021 Tina Watkins is a 56 y.o. female here for evaluation of pain with history of neuropathy, gout, osteoarthritis, and possible fibromyalgia syndrome. 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 She has joint pain and stiffness in a lot of areas. Muscle pains in neck and upper back.  Large joint pains have been bothering her for years varying in severity.  Shoulders and back are doing worse particularly in the past month.  Episodic gout flares have caused severe pain in first MTP joints.  Most recently gout flare in the left great toe about a week ago improved with colchicine treatment.  However on a routine basis having more symptoms in the bilateral hands and shoulders.  She was started on duloxetine 30 mg for lower extremity neuropathy pain. On allopurinol 100 mg daily for gout. Previous knee xray and MRI findings with chondrocalcinosis and bone infarcts. Right hip imaging with AVN.     Review of Systems  Constitutional:  Positive for fatigue.  HENT:  Positive for mouth dryness.   Eyes:  Positive for dryness.  Respiratory:  Positive for shortness of breath.   Cardiovascular:  Positive for swelling in legs/feet.  Gastrointestinal:  Negative for constipation.  Endocrine: Positive for cold intolerance, heat intolerance and excessive thirst.  Genitourinary:  Negative for difficulty urinating.  Musculoskeletal:  Positive for joint pain, gait problem, joint pain, joint swelling, muscle weakness, morning stiffness and muscle tenderness.  Skin:  Positive for rash.  Allergic/Immunologic: Negative for susceptible to infections.  Neurological:  Positive for numbness and weakness.  Hematological:  Negative for bruising/bleeding tendency.  Psychiatric/Behavioral:  Positive for sleep disturbance.     PMFS History:  Patient Active Problem List   Diagnosis Date Noted   Bilateral shoulder pain 06/06/2021   Bilateral hand swelling 06/06/2021   Cervical radiculitis 01/24/2021   Acute respiratory failure with hypoxia ([place]) 10/23/2020   Coagulation defect, unspecified ([place]) 08/01/2020   Aortic atherosclerosis ([place]) 07/10/2020   Secondary hypercoagulable state ([place]) 04/23/2020   Atrial flutter ([place]) 03/29/2020   Chronic pain of both knees 03/13/2020   Chills (without fever) 12/12/2019   Other pancytopenia ([place])    Decubitus ulcer of sacral region, stage 1 08/18/2019   HCAP (healthcare-associated pneumonia) 08/05/2019   Pressure injury of skin 07/24/2019   Chest congestion 07/24/2019   Itching 07/24/2019   Weakness 06/13/2019  Pneumonia due to COVID-19 virus 05/09/2019   Hypotension 05/09/2019   Symptomatic anemia 05/09/2019   CHF (congestive heart failure) ([place]) 05/09/2019   Swollen abdomen 01/04/2019   DDD (degenerative disc disease), cervical 12/06/2018   Neuropathy 12/06/2018   Paresthesia 10/11/2018   Neck pain 10/11/2018   Tobacco abuse 11/20/2017   Right leg  swelling 11/20/2017   Right leg pain 11/20/2017   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   GERD (gastroesophageal reflux disease) 11/20/2017   Acute venous embolism and thrombosis of deep vessels of proximal lower extremity, right ([place]) 11/20/2017   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    Thrush, oral    AKI (acute kidney injury) ([place])    Multifocal pneumonia 08/09/2017   ETD (Eustachian tube dysfunction), bilateral 08/03/2017   Acute recurrent pansinusitis 07/21/2017   Chest pain 09/29/2015   Cervical stenosis of spinal canal 01/08/2015   Urinary tract infectious disease    Muscle spasms of neck 06/15/2014   Bleeding hemorrhoid 06/15/2014   Anemia in chronic kidney disease 06/15/2014   Pyelonephritis, acute 06/13/2014   Sepsis ([place]) 06/13/2014   History of kidney transplant    Abnormal CT scan 09/14/2012   Chronic pain syndrome 04/09/2012   Dehydration, mild 04/09/2012   Herpes infection 06/23/2011   Anxiety 06/23/2011   E coli bacteremia 06/18/2011   ESRD (end stage renal disease) ([place]) 06/12/2011   UTI (lower urinary tract infection) 06/12/2011   Bacteremia due to Gram-negative bacteria 05/23/2011   History of renal transplantation 05/22/2011   Septic shock(785.52) 05/22/2011   Acute on chronic kidney failure ([place]) 05/22/2011   Gastroparesis 04/24/2008   WEIGHT LOSS 08/24/2007   NAUSEA AND VOMITING 08/24/2007    Past Medical History:  Diagnosis Date   Bacteremia due to Gram-negative bacteria 05/23/2011   Blind    right eye   Blind right eye    CHF (congestive heart failure) (HCC)    Chronic lower back pain    Complication of anesthesia    "woke up during OR; I have an extremely high tolerance" (12/11/2011) 1 procedure was graft; the other procedures were procedures that are typically done with sedation.   DDD (degenerative disc disease), cervical    Depression    Dysrhythmia    "tachycardia" (12/11/2011) new onset afib 10/15/14 EKG    E coli bacteremia 06/18/2011   Elevated LDL cholesterol level 12/2018   ESRD (end stage renal disease) ([place]) 06/12/2011   Fibromyalgia    Gastroparesis    Gastroparesis    GERD (gastroesophageal reflux disease)    Glaucoma    right eye   Gout    H/O carpal tunnel syndrome    Headache 10/2019   Headache(784.0)    "not often anymore" (12/11/2011)   Herpes genitalia 1994   History of blood transfusion    "more than a few times" (12/11/2011)   History of stomach ulcers    Hypotension    Iron deficiency anemia    New onset a-fib ([place])    10/15/14 EKG   Osteopenia    Peripheral neuropathy 11/2018   Pressure ulcer of sacral region, stage 1 07/2019   Seizures ([place]) 1994   "post transplant; only have had that one" (12/11/2011)   Spinal stenosis in cervical region    Stroke [facility])     left basal ganglia lacunar infarct; Right frontal lobe lacunar infarct.   Stroke [facility]) ~ 1999; 2001   "briefly lost my vision; lost my right eye" (  12/11/2011)   Vitamin D deficiency 12/2018    Family History  Problem Relation Age of Onset   Glaucoma Mother    Pancreatic cancer Father    Multiple sclerosis Brother    Diabetes Maternal Uncle    Hypertension Maternal Grandmother    Breast cancer Neg Hx    Past Surgical History:  Procedure Laterality Date   ANTERIOR CERVICAL DECOMP/DISCECTOMY FUSION N/A 01/08/2015   Procedure: Anterior Cervical Three-Four/Four-Five Decompression/Diskectomy/Fusion;  Surgeon: Leeroy Cha, MD;  Location: [place] [facility];  Service: Neurosurgery;  Laterality: N/A;  C3-4 C4-5 Anterior cervical decompression/diskectomy/fusion   APPENDECTOMY  ~ 2004   BIOPSY  09/12/2020   Procedure: BIOPSY;  Surgeon: Milus Banister, MD;  Location: [facility];  Service: Endoscopy;;   CATARACT EXTRACTION     right eye   COLONOSCOPY     COLONOSCOPY WITH PROPOFOL N/A 09/12/2020   Procedure: COLONOSCOPY WITH PROPOFOL;  Surgeon: Milus Banister, MD;  Location: [facility];  Service: Endoscopy;   Laterality: N/A;   ENUCLEATION  2001   "right"   ESOPHAGOGASTRODUODENOSCOPY (EGD) WITH PROPOFOL N/A 04/21/2012   Procedure: ESOPHAGOGASTRODUODENOSCOPY (EGD) WITH PROPOFOL;  Surgeon: Milus Banister, MD;  Location: [facility];  Service: Endoscopy;  Laterality: N/A;   ESOPHAGOGASTRODUODENOSCOPY (EGD) WITH PROPOFOL N/A 09/12/2020   Procedure: ESOPHAGOGASTRODUODENOSCOPY (EGD) WITH PROPOFOL;  Surgeon: Milus Banister, MD;  Location: [facility];  Service: Endoscopy;  Laterality: N/A;   INSERTION OF DIALYSIS CATHETER  1988   "AV graft LUA & LFA; LUA worked for 1 day; LFA never worked"   IR FLUORO GUIDE CV LINE RIGHT  07/11/2019   IR FLUORO GUIDE CV LINE RIGHT  10/20/2019   IR FLUORO GUIDE CV LINE RIGHT  05/31/2020   IR PTA VENOUS EXCEPT DIALYSIS CIRCUIT  05/31/2020   IR RADIOLOGY PERIPHERAL GUIDED IV START  07/11/2019   IR US GUIDE VASC ACCESS RIGHT  07/11/2019   IR US GUIDE VASC ACCESS RIGHT  07/11/2019   IR US GUIDE VASC ACCESS RIGHT  07/11/2019   IR VENOCAVAGRAM IVC  05/31/2020   KIDNEY TRANSPLANT  1994; 1999; 2005   "right"   MULTIPLE TOOTH EXTRACTIONS     POLYPECTOMY  09/12/2020   Procedure: POLYPECTOMY;  Surgeon: Milus Banister, MD;  Location: [facility];  Service: Endoscopy;;   RIGHT HEART CATH N/A 03/23/2019   Procedure: RIGHT HEART CATH;  Surgeon: Jolaine Artist, MD;  Location: [place] [facility];  Service: Cardiovascular;  Laterality: N/A;   [place]?; 1994; 2005   Social History   Social History Narrative   Right-handed.   Four cups caffeine per day.   Lives alone.   Immunization History  Administered Date(s) Administered   Hepatitis B, ped/adol 07/20/2019, 08/29/2019, 09/21/2019   Hepb-cpg 07/20/2019, 08/29/2019, 09/21/2019, 11/23/2019   Influenza Split 11/07/2019   Influenza,inj,Quad PF,6+ Mos 11/07/2019, 11/07/2020   PFIZER Comirnaty(Gray Top)Covid-19 Tri-Sucrose Vaccine 07/29/2020   PFIZER(Purple Top)SARS-COV-2 Vaccination 05/31/2019,  06/21/2019, 10/26/2019   PPD Test 08/10/2017   Pfizer Covid-19 Vaccine Bivalent Booster 29yr & up 02/06/2021     Objective: Vital Signs: BP (!) 93/58 (BP Location: Left Arm, Patient Position: Sitting, Cuff Size: Normal)   Pulse 86   Resp 15   Ht '4\' 11"'$  (1.499 m)   Wt 117 lb (53.1 kg)   BMI 23.63 kg/m    Physical Exam Cardiovascular:     Rate and Rhythm: Normal rate and regular rhythm.  Pulmonary:     Effort:  Pulmonary effort is normal.     Breath sounds: Normal breath sounds.  Musculoskeletal:     Right lower leg: No edema.     Left lower leg: No edema.  Skin:    General: Skin is warm and dry.     Findings: No rash.  Neurological:     Mental Status: She is alert.  Psychiatric:        Mood and Affect: Mood normal.      Musculoskeletal Exam:  Tenderness at the base of the neck paraspinal muscles Shoulders guarding against ROM in all planes, unable to full abduct overhead, decreased external rotation, pain provoked with overhead movement some radiation into upper arm both sides Elbows full ROM no tenderness or swelling Wrists full ROM no tenderness or swelling, negative tinel's and phalen's signs Unable to tightly close grip, tenderness at PIP joints and decreased flexion ROM but no palpable synovitis Knees slightly decreased flexion and extension ROM, no palpable swelling  Investigation: No additional findings.  Imaging: No results found.  Recent Labs: Lab Results  Component Value Date   WBC 4.2 03/30/2021   HGB 11.2 (L) 03/30/2021   PLT 156 03/30/2021   NA 137 03/30/2021   K 3.0 (L) 03/30/2021   CL 94 (L) 03/30/2021   CO2 24 03/30/2021   GLUCOSE 76 03/30/2021   BUN 10 03/30/2021   CREATININE 3.49 (H) 03/30/2021   BILITOT 1.2 03/30/2021   ALKPHOS 131 (H) 03/30/2021   AST 24 03/30/2021   ALT 23 03/30/2021   PROT 6.5 03/30/2021   ALBUMIN 3.2 (L) 03/30/2021   CALCIUM 9.5 03/30/2021   GFRAA 13 (L) 10/13/2019    Speciality Comments: No specialty  comments available.  Procedures:  Large Joint Inj: bilateral subacromial bursa on 07/07/2021 3:20 PM Indications: pain and joint swelling Details: 25 G 1.5 in needle, posterior approach Medications (Right): 3 mL lidocaine 1 %; 40 mg triamcinolone acetonide 40 MG/ML Medications (Left): 3 mL lidocaine 1 %; 40 mg triamcinolone acetonide 40 MG/ML Outcome: tolerated well, no immediate complications Procedure, treatment alternatives, risks and benefits explained, specific risks discussed. Consent was given by the patient. Immediately prior to procedure a time out was called to verify the correct patient, procedure, equipment, support staff and site/side marked as required. Patient was prepped and draped in the usual sterile fashion.     Allergies: Levofloxacin and Tape   Assessment / Plan:     Visit Diagnoses: Chronic pain of both shoulders - Bilateral shoulder pain x-rays checked in clinic do not demonstrate any significant arthritis. - Plan: Ambulatory referral to Physical Therapy, Large Joint Inj: bilateral subacromial bursa  I don't see significant OA or any calcifications on imaging. Symptoms look suspicious for bursitis with some features of impingement. No better since last visit. Treated with subacromial bursa injection b/l today. Also referring to PT to continue working on this for pain and mobility. If no benefit with local treatment and PT could further workup with MRI to rule out significant rotator cuff injury.  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 - allopurinol 100 mg.  Gout looks well controlled with low uric acid today. 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 Could benefit to have aspiration if large joint effusion occurs, or possibly DECT.  DDD (degenerative disc disease), cervical Neuropathy  Predominantly hand  symptoms without equal foot involvement atypical for systemic sensory polyneuropathy.  No appreciable signs for CTS at the moment or characteristic distribution.  Bilateral hand swelling  Current swelling is doing better but still very stiff and cannot fully grip. This is somewhat limiting some intermediate ADLs and mobility. Existing degenerative and erosive changes on imaging so could be this is baseline level of symptoms for her.  Orders: Orders Placed This Encounter  Procedures   Large Joint Inj: bilateral subacromial bursa   Ambulatory referral to Physical Therapy   No orders of the defined types were placed in this encounter.    Follow-Up Instructions: Return in about 2 months (around 09/06/2021) for Inflammatory arthritis injection f/u 48mo.   CCollier Salina MD  Note - This record has been created using [employer]  Chart creation errors have been sought, but may not always  have been located. Such creation errors do not reflect on  the standard of medical care.

## 2021-07-07 ENCOUNTER — Encounter: Payer: Self-pay | Admitting: Internal Medicine

## 2021-07-07 ENCOUNTER — Ambulatory Visit (INDEPENDENT_AMBULATORY_CARE_PROVIDER_SITE_OTHER): Payer: Medicare Other | Admitting: Internal Medicine

## 2021-07-07 VITALS — BP 93/58 | HR 86 | Resp 15 | Ht 59.0 in | Wt 117.0 lb

## 2021-07-07 DIAGNOSIS — M1A3720 Chronic gout due to renal impairment, left ankle and foot, without tophus (tophi): Secondary | ICD-10-CM | POA: Diagnosis not present

## 2021-07-07 DIAGNOSIS — M25512 Pain in left shoulder: Secondary | ICD-10-CM

## 2021-07-07 DIAGNOSIS — G8929 Other chronic pain: Secondary | ICD-10-CM

## 2021-07-07 DIAGNOSIS — M5030 Other cervical disc degeneration, unspecified cervical region: Secondary | ICD-10-CM | POA: Diagnosis not present

## 2021-07-07 DIAGNOSIS — M7989 Other specified soft tissue disorders: Secondary | ICD-10-CM

## 2021-07-07 DIAGNOSIS — G629 Polyneuropathy, unspecified: Secondary | ICD-10-CM

## 2021-07-07 DIAGNOSIS — M25511 Pain in right shoulder: Secondary | ICD-10-CM | POA: Diagnosis not present

## 2021-07-07 DIAGNOSIS — N186 End stage renal disease: Secondary | ICD-10-CM

## 2021-07-10 MED ORDER — TRIAMCINOLONE ACETONIDE 40 MG/ML IJ SUSP
40.0000 mg | INTRAMUSCULAR | Status: AC | PRN
  Administered 2021-07-07: 40 mg via INTRA_ARTICULAR

## 2021-07-10 MED ORDER — LIDOCAINE HCL 1 % IJ SOLN
3.0000 mL | INTRAMUSCULAR | Status: AC | PRN
  Administered 2021-07-07: 3 mL

## 2021-07-14 ENCOUNTER — Encounter: Payer: Self-pay | Admitting: Nurse Practitioner

## 2021-07-14 ENCOUNTER — Ambulatory Visit (INDEPENDENT_AMBULATORY_CARE_PROVIDER_SITE_OTHER): Payer: Medicare Other | Admitting: Nurse Practitioner

## 2021-07-14 VITALS — BP 103/64 | HR 70 | Temp 98.1°F | Ht 59.0 in | Wt 120.1 lb

## 2021-07-14 DIAGNOSIS — K3184 Gastroparesis: Secondary | ICD-10-CM | POA: Diagnosis not present

## 2021-07-14 DIAGNOSIS — Z0000 Encounter for general adult medical examination without abnormal findings: Secondary | ICD-10-CM

## 2021-07-14 DIAGNOSIS — R7303 Prediabetes: Secondary | ICD-10-CM | POA: Diagnosis not present

## 2021-07-14 DIAGNOSIS — Z720 Tobacco use: Secondary | ICD-10-CM

## 2021-07-14 LAB — POCT GLYCOSYLATED HEMOGLOBIN (HGB A1C)
HbA1c POC (<> result, manual entry): 4 % (ref 4.0–5.6)
HbA1c, POC (controlled diabetic range): 4 % (ref 0.0–7.0)
HbA1c, POC (prediabetic range): 4 % — AB (ref 5.7–6.4)
Hemoglobin A1C: 4 % (ref 4.0–5.6)

## 2021-07-14 NOTE — Patient Instructions (Signed)
You were seen today in the [facility] for 3 mth follow up. Labs were collected, results will be available via MyChart or, if abnormal, you will be contacted by clinic staff.  Please follow up in 6 mths for wellness.

## 2021-07-14 NOTE — Progress Notes (Signed)
[place], [place]  [address] Phone:  [phone]   Fax:  [phone] Subjective:   Patient ID: [name], female    DOB: [birth year], 56 y.o.   MRN: [id number]  Chief Complaint  Patient presents with   Follow-up    Pt is here for 3 months follow up visit. Pt stated she has no issues or concerns. Pt is requesting refill for oxyCODONE   HPI JOELI FENNER 56 y.o. female  has a past medical history of Bacteremia due to Gram-negative bacteria (05/23/2011), Blind, Blind right eye, CHF (congestive heart failure) ([place]), Chronic lower back pain, Complication of anesthesia, DDD (degenerative disc disease), cervical, Depression, Dysrhythmia, E coli bacteremia (06/18/2011), Elevated LDL cholesterol level (12/2018), ESRD (end stage renal disease) ([place]) (06/12/2011), Fibromyalgia, Gastroparesis, Gastroparesis, GERD (gastroesophageal reflux disease), Glaucoma, Gout, H/O carpal tunnel syndrome, Headache (10/2019), Headache(784.0), Herpes genitalia (1994), History of blood transfusion, History of stomach ulcers, Hypotension, Iron deficiency anemia, New onset a-fib ([place]), Osteopenia, Peripheral neuropathy (11/2018), Pressure ulcer of sacral region, stage 1 (07/2019), Seizures ([place]) (1994), Spinal stenosis in cervical region, Stroke [facility]), Stroke ([place]) (~ 1999; 2001), and Vitamin D deficiency (12/2018). To the [facility] for 3 mth follow up.   Since last visit, denies having any issues or repeat occurrence of pneumonia. States that she has not had a gastroparesis flare since last visit. Continues to smoke cigarettes regularly, attempting to quit with usage of patches. Currently smokes a 1/2 PPD, states that amount has increased since menthol was removed from cigarettes. Denies any other concerns today.   Denies any fatigue, chest pain, shortness of breath, HA or dizziness. Denies any blurred vision, numbness or tingling.   Past Medical History:  Diagnosis Date    Bacteremia due to Gram-negative bacteria 05/23/2011   Blind    right eye   Blind right eye    CHF (congestive heart failure) (HCC)    Chronic lower back pain    Complication of anesthesia    "woke up during OR; I have an extremely high tolerance" (12/11/2011) 1 procedure was graft; the other procedures were procedures that are typically done with sedation.   DDD (degenerative disc disease), cervical    Depression    Dysrhythmia    "tachycardia" (12/11/2011) new onset afib 10/15/14 EKG   E coli bacteremia 06/18/2011   Elevated LDL cholesterol level 12/2018   ESRD (end stage renal disease) ([place]) 06/12/2011   Fibromyalgia    Gastroparesis    Gastroparesis    GERD (gastroesophageal reflux disease)    Glaucoma    right eye   Gout    H/O carpal tunnel syndrome    Headache 10/2019   Headache(784.0)    "not often anymore" (12/11/2011)   Herpes genitalia 1994   History of blood transfusion    "more than a few times" (12/11/2011)   History of stomach ulcers    Hypotension    Iron deficiency anemia    New onset a-fib ([place])    10/15/14 EKG   Osteopenia    Peripheral neuropathy 11/2018   Pressure ulcer of sacral region, stage 1 07/2019   Seizures ([place]) 1994   "post transplant; only have had that one" (12/11/2011)   Spinal stenosis in cervical region    Stroke [facility])     left basal ganglia lacunar infarct; Right frontal lobe lacunar infarct.   Stroke [facility]) ~ 1999; 2001   "briefly lost my vision; lost my right eye" (12/11/2011)  Vitamin D deficiency 12/2018    Past Surgical History:  Procedure Laterality Date   ANTERIOR CERVICAL DECOMP/DISCECTOMY FUSION N/A 01/08/2015   Procedure: Anterior Cervical Three-Four/Four-Five Decompression/Diskectomy/Fusion;  Surgeon: Leeroy Cha, MD;  Location: [place] [facility];  Service: Neurosurgery;  Laterality: N/A;  C3-4 C4-5 Anterior cervical decompression/diskectomy/fusion   APPENDECTOMY  ~ 2004   BIOPSY  09/12/2020   Procedure: BIOPSY;  Surgeon: Milus Banister, MD;  Location: [facility];  Service: Endoscopy;;   CATARACT EXTRACTION     right eye   COLONOSCOPY     COLONOSCOPY WITH PROPOFOL N/A 09/12/2020   Procedure: COLONOSCOPY WITH PROPOFOL;  Surgeon: Milus Banister, MD;  Location: [facility]OS[facility];  Service: Endoscopy;  Laterality: N/A;   ENUCLEATION  2001   "right"   ESOPHAGOGASTRODUODENOSCOPY (EGD) WITH PROPOFOL N/A 04/21/2012   Procedure: ESOPHAGOGASTRODUODENOSCOPY (EGD) WITH PROPOFOL;  Surgeon: Milus Banister, MD;  Location: [facility];  Service: Endoscopy;  Laterality: N/A;   ESOPHAGOGASTRODUODENOSCOPY (EGD) WITH PROPOFOL N/A 09/12/2020   Procedure: ESOPHAGOGASTRODUODENOSCOPY (EGD) WITH PROPOFOL;  Surgeon: Milus Banister, MD;  Location: [facility];  Service: Endoscopy;  Laterality: N/A;   INSERTION OF DIALYSIS CATHETER  1988   "AV graft LUA & LFA; LUA worked for 1 day; LFA never worked"   IR FLUORO GUIDE CV LINE RIGHT  07/11/2019   IR FLUORO GUIDE CV LINE RIGHT  10/20/2019   IR FLUORO GUIDE CV LINE RIGHT  05/31/2020   IR PTA VENOUS EXCEPT DIALYSIS CIRCUIT  05/31/2020   IR RADIOLOGY PERIPHERAL GUIDED IV START  07/11/2019   IR US GUIDE VASC ACCESS RIGHT  07/11/2019   IR US GUIDE VASC ACCESS RIGHT  07/11/2019   IR US GUIDE VASC ACCESS RIGHT  07/11/2019   IR VENOCAVAGRAM IVC  05/31/2020   KIDNEY TRANSPLANT  1994; 1999; 2005   "right"   MULTIPLE TOOTH EXTRACTIONS     POLYPECTOMY  09/12/2020   Procedure: POLYPECTOMY;  Surgeon: Milus Banister, MD;  Location: [facility];  Service: Endoscopy;;   RIGHT HEART CATH N/A 03/23/2019   Procedure: RIGHT HEART CATH;  Surgeon: Jolaine Artist, MD;  Location: [place] CV LAB;  Service: Cardiovascular;  Laterality: N/A;   [place]?; 1994; 2005    Family History  Problem Relation Age of Onset   Glaucoma Mother    Pancreatic cancer Father    Multiple sclerosis Brother    Diabetes Maternal Uncle    Hypertension Maternal Grandmother    Breast cancer Neg Hx      Social History   Socioeconomic History   Marital status: Single    Spouse name: Not on file   Number of children: 0   Years of education: some college   Highest education level: Some college, no degree  Occupational History   Occupation: disabled    Fish farm manager: UNEMPLOYED  Tobacco Use   Smoking status: Every Day    Packs/day: 0.33    Years: 35.00    Total pack years: 11.55    Types: Cigarettes    Passive exposure: Never   Smokeless tobacco: Never   Tobacco comments:    Pack of cigarettes lasts 2 days 09/18/2020  Vaping Use   Vaping Use: Never used  Substance and Sexual Activity   Alcohol use: Yes    Alcohol/week: 1.0 standard drink of alcohol    Types: 1 Standard drinks or equivalent per week    Comment: rare if out to dinner   Drug use:  Not Currently    Frequency: 1.0 times per week    Types: Marijuana    Comment: smoking once a day when in severe pain   Sexual activity: Not Currently    Partners: Male    Birth control/protection: None  Other Topics Concern   Not on file  Social History Narrative   Right-handed.   Four cups caffeine per day.   Lives alone.   Social Determinants of Health   Financial Resource Strain: Low Risk  (04/24/2021)   Overall Financial Resource Strain (CARDIA)    Difficulty of Paying Living Expenses: Not hard at all  Food Insecurity: No Food Insecurity (04/24/2021)   Hunger Vital Sign    Worried About Running Out of Food in the Last Year: Never true    Ran Out of Food in the Last Year: Never true  Transportation Needs: No Transportation Needs (04/24/2021)   PRAPARE - Hydrologist (Medical): No    Lack of Transportation (Non-Medical): No  Physical Activity: Insufficiently Active (04/24/2021)   Exercise Vital Sign    Days of Exercise per Week: 4 days    Minutes of Exercise per Session: 20 min  Stress: Stress Concern Present (04/24/2021)   [place]    Feeling of Stress : Rather much  Social Connections: Moderately Isolated (04/24/2021)   Social Connection and Isolation Panel [NHANES]    Frequency of Communication with Friends and Family: Three times a week    Frequency of Social Gatherings with Friends and Family: Once a week    Attends Religious Services: 1 to 4 times per year    Active Member of [employer] or Organizations: No    Attends Archivist Meetings: Never    Marital Status: Never married  Intimate Partner Violence: Not At Risk (04/24/2021)   Humiliation, Afraid, Rape, and Kick questionnaire    Fear of Current or Ex-Partner: No    Emotionally Abused: No    Physically Abused: No    Sexually Abused: No    Outpatient Medications Prior to Visit  Medication Sig Dispense Refill   allopurinol (ZYLOPRIM) 100 MG tablet TAKE 1 TABLET BY MOUTH EVERY DAY 90 tablet 3   amiodarone (PACERONE) 200 MG tablet Take 1 tablet (200 mg total) by mouth daily. 30 tablet 3   apixaban (ELIQUIS) 2.5 MG TABS tablet Take 1 tablet (2.5 mg total) by mouth 2 (two) times daily. 60 tablet 3   B Complex-C-Zn-Folic Acid (DIALYVITE 563-OVFI 15) 0.8 MG TABS Take 1 tablet by mouth daily.     busPIRone (BUSPAR) 10 MG tablet Take 5 mg by mouth 2 (two) times daily.     calcium acetate (PHOSLO) 667 MG capsule Take 667 mg by mouth See admin instructions. Take 1 capsule (667 mg) by mouth with each meal and with each snack     Colchicine 0.6 MG CAPS Take 0.6 mg by mouth daily as needed (gout flare up).     diclofenac Sodium (VOLTAREN) 1 % GEL APPLY 2 GRAMS TO AFFECTED AREA 4 TIMES A DAY 200 g 2   diphenhydrAMINE (BENADRYL) 25 mg capsule Take 1 capsule (25 mg total) by mouth every 8 (eight) hours as needed. 30 capsule 6   escitalopram (LEXAPRO) 20 MG tablet Take 20 mg by mouth daily.     famotidine (PEPCID) 20 MG tablet Take 20 mg by mouth 2 (two) times daily.     fluticasone (FLONASE) 50 MCG/ACT nasal spray  Place 1 spray into both nostrils daily as  needed for allergies or rhinitis.     fluticasone-salmeterol (ADVAIR HFA) 115-21 MCG/ACT inhaler Inhale 2 puffs into the lungs 2 (two) times daily. 1 each 12   gabapentin (NEURONTIN) 100 MG capsule TAKE 1 CAPSULE BY MOUTH TWICE A DAY MAX 2 CAPSULES DAILY 60 capsule 1   iron sucrose in sodium chloride 0.9 % 100 mL Iron Sucrose (Venofer)     latanoprost (XALATAN) 0.005 % ophthalmic solution Place 1 drop into the left eye at bedtime.  3   lidocaine (LIDODERM) 5 % Place 1 patch onto the skin daily as needed (for pain- remove old patch first).      Methoxy PEG-Epoetin Beta (MIRCERA IJ) Mircera     metoCLOPramide (REGLAN) 10 MG tablet Take 1 tablet (10 mg total) by mouth every 8 (eight) hours as needed for nausea. 90 tablet 0   metoCLOPramide (REGLAN) 5 MG tablet TAKE 1 TABLET (5 MG TOTAL) BY MOUTH 2 (TWO) TIMES DAILY BEFORE A MEAL. 180 tablet 3   midodrine (PROAMATINE) 10 MG tablet Take 1.5 tablets (15 mg total) by mouth 3 (three) times daily. 405 tablet 3   omeprazole (PRILOSEC) 40 MG capsule TAKE 1 CAPSULE (40 MG TOTAL) BY MOUTH DAILY. 90 capsule 1   ondansetron (ZOFRAN-ODT) 4 MG disintegrating tablet Take 1 tablet (4 mg total) by mouth every 8 (eight) hours as needed for nausea or vomiting. 20 tablet 11   oxyCODONE-acetaminophen (PERCOCET) 10-325 MG tablet Take 1 tablet by mouth 4 (four) times daily as needed for pain.     Vitamin D, Ergocalciferol, (DRISDOL) 1.25 MG (50000 UNIT) CAPS capsule TAKE 1 CAPSULE (50,000 UNITS TOTAL) BY MOUTH EVERY 7 (SEVEN) DAYS 5 capsule 6   zolpidem (AMBIEN) 10 MG tablet Take 1 tablet (10 mg total) by mouth at bedtime as needed for sleep. 10 tablet 0   albuterol (VENTOLIN HFA) 108 (90 Base) MCG/ACT inhaler Inhale 1-2 puffs into the lungs every 6 (six) hours as needed for wheezing or shortness of breath. (Patient not taking: Reported on 07/07/2021)     nicotine (NICODERM CQ) 21 mg/24hr patch Place 1 patch (21 mg total) onto the skin daily. (Patient not taking: Reported on  07/07/2021) 90 patch 3   No facility-administered medications prior to visit.    Allergies  Allergen Reactions   Levofloxacin Itching and Rash   Tape Other (See Comments)    "Certain surgical tapes peel off my skin"    Review of Systems  Constitutional:  Negative for chills, fever and malaise/fatigue.  Respiratory:  Negative for cough and shortness of breath.   Cardiovascular:  Negative for chest pain, palpitations and leg swelling.  Gastrointestinal:  Negative for abdominal pain, blood in stool, constipation, diarrhea, nausea and vomiting.  Skin: Negative.   Neurological: Negative.   Psychiatric/Behavioral:  Negative for depression. The patient is not nervous/anxious.   All other systems reviewed and are negative.      Objective:    Physical Exam Vitals reviewed.  Constitutional:      General: She is not in acute distress.    Appearance: Normal appearance. She is normal weight.  HENT:     Head: Normocephalic.  Neck:     Vascular: No carotid bruit.  Cardiovascular:     Rate and Rhythm: Normal rate and regular rhythm.     Pulses: Normal pulses.     Heart sounds: Normal heart sounds.     Comments: No obvious peripheral edema Pulmonary:  Effort: Pulmonary effort is normal.     Breath sounds: Normal breath sounds.  Musculoskeletal:        General: No swelling, tenderness, deformity or signs of injury. Normal range of motion.     Cervical back: Normal range of motion and neck supple. No rigidity.     Right lower leg: No edema.     Left lower leg: No edema.  Lymphadenopathy:     Cervical: No cervical adenopathy.  Skin:    General: Skin is warm and dry.     Capillary Refill: Capillary refill takes less than 2 seconds.  Neurological:     General: No focal deficit present.     Mental Status: She is alert and oriented to person, place, and time.  Psychiatric:        Mood and Affect: Mood normal.        Behavior: Behavior normal.        Thought Content: Thought  content normal.        Judgment: Judgment normal.     BP 103/64 (BP Location: Left Arm, Patient Position: Sitting, Cuff Size: Small)   Pulse 70   Temp 98.1 F (36.7 C)   Ht '4\' 11"'$  (1.499 m)   Wt 120 lb 1.6 oz (54.5 kg)   SpO2 100%   BMI 24.26 kg/m  Wt Readings from Last 3 Encounters:  07/14/21 120 lb 1.6 oz (54.5 kg)  07/07/21 117 lb (53.1 kg)  07/02/21 114 lb (51.7 kg)    Immunization History  Administered Date(s) Administered   Hepatitis B, ped/adol 07/20/2019, 08/29/2019, 09/21/2019   Hepb-cpg 07/20/2019, 08/29/2019, 09/21/2019, 11/23/2019   Influenza Split 11/07/2019   Influenza,inj,Quad PF,6+ Mos 11/07/2019, 11/07/2020   PFIZER Comirnaty(Gray Top)Covid-19 Tri-Sucrose Vaccine 07/29/2020   PFIZER(Purple Top)SARS-COV-2 Vaccination 05/31/2019, 06/21/2019, 10/26/2019   PPD Test 08/10/2017   Pfizer Covid-19 Vaccine Bivalent Booster 25yr & up 02/06/2021    Diabetic Foot Exam - Simple   No data filed     Lab Results  Component Value Date   TSH 4.978 (H) 03/26/2021   Lab Results  Component Value Date   WBC 4.2 03/30/2021   HGB 11.2 (L) 03/30/2021   HCT 38.1 03/30/2021   MCV 86.0 03/30/2021   PLT 156 03/30/2021   Lab Results  Component Value Date   NA 137 03/30/2021   K 3.0 (L) 03/30/2021   CO2 24 03/30/2021   GLUCOSE 76 03/30/2021   BUN 10 03/30/2021   CREATININE 3.49 (H) 03/30/2021   BILITOT 1.2 03/30/2021   ALKPHOS 131 (H) 03/30/2021   AST 24 03/30/2021   ALT 23 03/30/2021   PROT 6.5 03/30/2021   ALBUMIN 3.2 (L) 03/30/2021   CALCIUM 9.5 03/30/2021   ANIONGAP 19 (H) 03/30/2021   Lab Results  Component Value Date   CHOL 214 (H) 12/06/2018   CHOL  08/23/2008    171        ATP III CLASSIFICATION:  <200     mg/dL   Desirable  200-239  mg/dL   Borderline High  >=240    mg/dL   High          Lab Results  Component Value Date   HDL 89 12/06/2018   HDL 58 08/23/2008   Lab Results  Component Value Date   LDLCALC 105 (H) 12/06/2018   [place]  08/23/2008    92        Total Cholesterol/HDL:CHD Risk Coronary Heart Disease Risk Table  Men   Women  1/2 Average Risk   3.4   3.3  Average Risk       5.0   4.4  2 X Average Risk   9.6   7.1  3 X Average Risk  23.4   11.0        Use the calculated Patient Ratio above and the CHD Risk Table to determine the patient's CHD Risk.        ATP III CLASSIFICATION (LDL):  <100     mg/dL   Optimal  100-129  mg/dL   Near or Above                    Optimal  130-159  mg/dL   Borderline  160-189  mg/dL   High  >190     mg/dL   Very High   Lab Results  Component Value Date   TRIG 118 12/06/2018   TRIG 103 08/23/2008   Lab Results  Component Value Date   CHOLHDL 2.4 12/06/2018   CHOLHDL 2.9 08/23/2008   Lab Results  Component Value Date   HGBA1C 4.8 05/08/2019   HGBA1C 4.6 12/06/2018   HGBA1C 5.5 12/10/2011       Assessment & Plan:   Problem List Items Addressed This Visit       Digestive   Gastroparesis Continue previously establish treatment plan  Discussed non pharmacological methods for management of symptoms Informed to take OTC medications as needed    Other Visit Diagnoses     Encounter for wellness examination in adult    -  Primary   Relevant Orders   Lipid panel Encouraged continued diet and exercise efforts  Encouraged continued compliance with medication     Tobacco use     Encouraged smoking cessation  Discussed smoking cessation options Set goal date, tomorrow, to initiate efforts   Follow up in 6 mths for wellness visit, sooner as needed     I am having Dorthia D. [name] maintain her latanoprost, lidocaine, diphenhydrAMINE, zolpidem, oxyCODONE-acetaminophen, Colchicine, busPIRone, nicotine, calcium acetate, albuterol, fluticasone, metoCLOPramide, Dialyvite 800-Zinc 15, ondansetron, omeprazole, diclofenac Sodium, iron sucrose in sodium chloride 0.9 % 100 mL, Methoxy PEG-Epoetin Beta (MIRCERA IJ), escitalopram, allopurinol,  Vitamin D (Ergocalciferol), metoCLOPramide, Advair HFA, midodrine, amiodarone, apixaban, gabapentin, and famotidine.  No orders of the defined types were placed in this encounter.    Teena Dunk, NP

## 2021-07-15 LAB — LIPID PANEL
Chol/HDL Ratio: 1.8 ratio (ref 0.0–4.4)
Cholesterol, Total: 202 mg/dL — ABNORMAL HIGH (ref 100–199)
HDL: 113 mg/dL (ref >39)
LDL Chol Calc (NIH): 79 mg/dL (ref 0–99)
Triglycerides: 51 mg/dL (ref 0–149)
VLDL Cholesterol Cal: 10 mg/dL (ref 5–40)

## 2021-07-18 ENCOUNTER — Other Ambulatory Visit: Payer: Self-pay

## 2021-07-18 ENCOUNTER — Ambulatory Visit: Payer: Medicare Other | Attending: Internal Medicine

## 2021-07-18 DIAGNOSIS — G8929 Other chronic pain: Secondary | ICD-10-CM | POA: Diagnosis present

## 2021-07-18 DIAGNOSIS — M25612 Stiffness of left shoulder, not elsewhere classified: Secondary | ICD-10-CM | POA: Diagnosis present

## 2021-07-18 DIAGNOSIS — M25512 Pain in left shoulder: Secondary | ICD-10-CM | POA: Diagnosis present

## 2021-07-18 DIAGNOSIS — M6281 Muscle weakness (generalized): Secondary | ICD-10-CM | POA: Diagnosis present

## 2021-07-18 DIAGNOSIS — M25611 Stiffness of right shoulder, not elsewhere classified: Secondary | ICD-10-CM | POA: Diagnosis present

## 2021-07-18 DIAGNOSIS — M25511 Pain in right shoulder: Secondary | ICD-10-CM | POA: Insufficient documentation

## 2021-07-18 NOTE — Therapy (Signed)
OUTPATIENT PHYSICAL THERAPY SHOULDER EVALUATION   Patient Name: Tina Watkins MRN: [id number] DOB:[birth year], 56 y.o., female Today's Date: 07/18/2021   PT End of Session - 07/18/21 0722     Visit Number 1    Number of Visits 13    Date for PT Re-Evaluation 09/05/21    Authorization Type MEDICARE PART A AND B; MEDICAID [place] ACCESS    PT Start Time 0717    PT Stop Time 0802    PT Time Calculation (min) 45 min    Activity Tolerance Patient tolerated treatment well;Patient limited by pain    Behavior During Therapy [facility] for tasks assessed/performed             Past Medical History:  Diagnosis Date   Bacteremia due to Gram-negative bacteria 05/23/2011   Blind    right eye   Blind right eye    CHF (congestive heart failure) (HCC)    Chronic lower back pain    Complication of anesthesia    "woke up during OR; I have an extremely high tolerance" (12/11/2011) 1 procedure was graft; the other procedures were procedures that are typically done with sedation.   DDD (degenerative disc disease), cervical    Depression    Dysrhythmia    "tachycardia" (12/11/2011) new onset afib 10/15/14 EKG   E coli bacteremia 06/18/2011   Elevated LDL cholesterol level 12/2018   ESRD (end stage renal disease) ([place]) 06/12/2011   Fibromyalgia    Gastroparesis    Gastroparesis    GERD (gastroesophageal reflux disease)    Glaucoma    right eye   Gout    H/O carpal tunnel syndrome    Headache 10/2019   Headache(784.0)    "not often anymore" (12/11/2011)   Herpes genitalia 1994   History of blood transfusion    "more than a few times" (12/11/2011)   History of stomach ulcers    Hypotension    Iron deficiency anemia    New onset a-fib ([place])    10/15/14 EKG   Osteopenia    Peripheral neuropathy 11/2018   Pressure ulcer of sacral region, stage 1 07/2019   Seizures ([place]) 1994   "post transplant; only have had that one" (12/11/2011)   Spinal stenosis in cervical region    Stroke [facility])      left basal ganglia lacunar infarct; Right frontal lobe lacunar infarct.   Stroke [facility]) ~ 1999; 2001   "briefly lost my vision; lost my right eye" (12/11/2011)   Vitamin D deficiency 12/2018   Past Surgical History:  Procedure Laterality Date   ANTERIOR CERVICAL DECOMP/DISCECTOMY FUSION N/A 01/08/2015   Procedure: Anterior Cervical Three-Four/Four-Five Decompression/Diskectomy/Fusion;  Surgeon: Leeroy Cha, MD;  Location: [place] NEURO ORS;  Service: Neurosurgery;  Laterality: N/A;  C3-4 C4-5 Anterior cervical decompression/diskectomy/fusion   APPENDECTOMY  ~ 2004   BIOPSY  09/12/2020   Procedure: BIOPSY;  Surgeon: Milus Banister, MD;  Location: [facility] ENDOSCOPY;  Service: Endoscopy;;   CATARACT EXTRACTION     right eye   COLONOSCOPY     COLONOSCOPY WITH PROPOFOL N/A 09/12/2020   Procedure: COLONOSCOPY WITH PROPOFOL;  Surgeon: Milus Banister, MD;  Location: [facility] ENDOSCOPY;  Service: Endoscopy;  Laterality: N/A;   ENUCLEATION  2001   "right"   ESOPHAGOGASTRODUODENOSCOPY (EGD) WITH PROPOFOL N/A 04/21/2012   Procedure: ESOPHAGOGASTRODUODENOSCOPY (EGD) WITH PROPOFOL;  Surgeon: Milus Banister, MD;  Location: [facility];  Service: Endoscopy;  Laterality: N/A;   ESOPHAGOGASTRODUODENOSCOPY (EGD) WITH PROPOFOL N/A 09/12/2020   Procedure: ESOPHAGOGASTRODUODENOSCOPY (  EGD) WITH PROPOFOL;  Surgeon: Milus Banister, MD;  Location: [facility];  Service: Endoscopy;  Laterality: N/A;   INSERTION OF DIALYSIS CATHETER  1988   "AV graft LUA & LFA; LUA worked for 1 day; LFA never worked"   IR FLUORO GUIDE CV LINE RIGHT  07/11/2019   IR FLUORO GUIDE CV LINE RIGHT  10/20/2019   IR FLUORO GUIDE CV LINE RIGHT  05/31/2020   IR PTA VENOUS EXCEPT DIALYSIS CIRCUIT  05/31/2020   IR RADIOLOGY PERIPHERAL GUIDED IV START  07/11/2019   IR US GUIDE VASC ACCESS RIGHT  07/11/2019   IR US GUIDE VASC ACCESS RIGHT  07/11/2019   IR US GUIDE VASC ACCESS RIGHT  07/11/2019   IR VENOCAVAGRAM IVC  05/31/2020   KIDNEY TRANSPLANT  1994; 1999; 2005    "right"   MULTIPLE TOOTH EXTRACTIONS     POLYPECTOMY  09/12/2020   Procedure: POLYPECTOMY;  Surgeon: Milus Banister, MD;  Location: [facility];  Service: Endoscopy;;   RIGHT HEART CATH N/A 03/23/2019   Procedure: RIGHT HEART CATH;  Surgeon: Jolaine Artist, MD;  Location: [place] CV LAB;  Service: Cardiovascular;  Laterality: N/A;   [place]?; 1994; 2005   Patient Active Problem List   Diagnosis Date Noted   Bilateral shoulder pain 06/06/2021   Bilateral hand swelling 06/06/2021   Cervical radiculitis 01/24/2021   Acute respiratory failure with hypoxia ([place]) 10/23/2020   Coagulation defect, unspecified ([place]) 08/01/2020   Aortic atherosclerosis ([place]) 07/10/2020   Secondary hypercoagulable state ([place]) 04/23/2020   Atrial flutter ([place]) 03/29/2020   Chronic pain of both knees 03/13/2020   Chills (without fever) 12/12/2019   Other pancytopenia ([place])    Decubitus ulcer of sacral region, stage 1 08/18/2019   HCAP (healthcare-associated pneumonia) 08/05/2019   Pressure injury of skin 07/24/2019   Chest congestion 07/24/2019   Itching 07/24/2019   Weakness 06/13/2019   Pneumonia due to COVID-19 virus 05/09/2019   Hypotension 05/09/2019   Symptomatic anemia 05/09/2019   CHF (congestive heart failure) ([place]) 05/09/2019   Swollen abdomen 01/04/2019   DDD (degenerative disc disease), cervical 12/06/2018   Neuropathy 12/06/2018   Paresthesia 10/11/2018   Neck pain 10/11/2018   Tobacco abuse 11/20/2017   Right leg swelling 11/20/2017   Right leg pain 11/20/2017   Stroke (cerebrum) ([place]) 11/20/2017   GERD (gastroesophageal reflux disease) 11/20/2017   Acute venous embolism and thrombosis of deep vessels of proximal lower extremity, right ([place]) 11/20/2017   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    Thrush, oral    AKI (acute kidney injury) ([place])    Multifocal pneumonia 08/09/2017   ETD (Eustachian tube  dysfunction), bilateral 08/03/2017   Acute recurrent pansinusitis 07/21/2017   Chest pain 09/29/2015   Cervical stenosis of spinal canal 01/08/2015   Urinary tract infectious disease    Muscle spasms of neck 06/15/2014   Bleeding hemorrhoid 06/15/2014   Anemia in chronic kidney disease 06/15/2014   Pyelonephritis, acute 06/13/2014   Sepsis ([place]) 06/13/2014   History of kidney transplant    Abnormal CT scan 09/14/2012   Chronic pain syndrome 04/09/2012   Dehydration, mild 04/09/2012   Herpes infection 06/23/2011   Anxiety 06/23/2011   E coli bacteremia 06/18/2011   ESRD (end stage renal disease) ([place]) 06/12/2011   UTI (lower urinary tract infection) 06/12/2011   Bacteremia due to Gram-negative bacteria 05/23/2011   History of renal transplantation  05/22/2011   Septic shock(785.52) 05/22/2011   Acute on chronic kidney failure ([place]) 05/22/2011   Gastroparesis 04/24/2008   WEIGHT LOSS 08/24/2007   NAUSEA AND VOMITING 08/24/2007    PCP: Bo Merino I, NP  REFERRING PROVIDER: Collier Salina, MD  REFERRING DIAG: M25.511,G89.29,M25.512 (ICD-10-CM) - Chronic pain of both shoulders  THERAPY DIAG:  Chronic left shoulder pain  Chronic right shoulder pain  Stiffness of left shoulder, not elsewhere classified  Stiffness of right shoulder, not elsewhere classified  Muscle weakness (generalized)  Rationale for Evaluation and Treatment Rehabilitation  ONSET DATE: Chronic  SUBJECTIVE:                                                                                                                                                                                      SUBJECTIVE STATEMENT: Severe shoulder pain, unable to lift hands above her head. Received steroid injections which has helped to decrease the pain. Pt notes a chronic Hx of shoulder pain, but the past 6 months she not been able to lift her arms as well. Pt is R handed. I have a lot of pain in my upper shoulders  with knots in the muscles.  PERTINENT HISTORY: DDD cervical, fibromyalgia, spinal stenosis cervical, CHF  PAIN:  Are you having pain? Yes: NPRS scale: 7/10 Pain location: Upper shoulders to [place] jt to upper arms Pain description: ache and throb Aggravating factors: Just using my arms Relieving factors: Meds, pain meds Pain range: 5-9/10  PRECAUTIONS: None  WEIGHT BEARING RESTRICTIONS No  FALLS:  Has patient fallen in last 6 months? Yes. Number of falls 1 Missed a step ascending steps. No balance issues observed.  LIVING ENVIRONMENT: Lives with: lives alone family helps out Lives in: House/apartment Abble to access appt and be mobile within  OCCUPATION: Disablity  PLOF: Independent  PATIENT GOALS More strength and mobility of her  shoulders  OBJECTIVE:   DIAGNOSTIC FINDINGS:  X-ray right shoulder 4 views 06/20/21  Glenohumeral joint space appears normal.  Normal internal and external  rotation of humerus.  AC joint space appears normal.  No visible erosions  or abnormal calcifications seen.   Impression   Normal appearing shoulder x-ray  X-ray left shoulder 4 views 06/20/21  Glenohumeral joint space appears normal.  Normal internal and external  rotation of humerus.  AC joint space appears normal.  No visible erosions  or abnormal calcifications seen.   Impression   Normal appearing shoulder x-ray  PATIENT SURVEYS:  FOTO 58% predicted 64%  COGNITION:  Overall cognitive status: Within functional limits for tasks assessed     SENSATION: WFL  POSTURE: Forward head and rounded shoulders  UPPER EXTREMITY ROM:    AROM/AAROM/PROM Right eval Left eval  Shoulder flexion 85/118/115 88/110/110  Shoulder extension    Shoulder abduction    Shoulder adduction    Shoulder internal rotation    Shoulder external rotation    Elbow flexion    Elbow extension    Wrist flexion    Wrist extension    Wrist ulnar deviation    Wrist radial deviation    Wrist  pronation    Wrist supination    (Blank rows = not tested)  UPPER EXTREMITY MMT: Tested strength in a neutral position due to pain. Pt demonstrated strength against resistance seeming at least 3/5. No overt weakness noted. MMT Right eval Left eval  Shoulder flexion    Shoulder extension    Shoulder abduction    Shoulder adduction    Shoulder internal rotation    Shoulder external rotation    Middle trapezius    Lower trapezius    Elbow flexion    Elbow extension    Wrist flexion    Wrist extension    Wrist ulnar deviation    Wrist radial deviation    Wrist pronation    Wrist supination    Grip strength (lbs)    (Blank rows = not tested)  SHOULDER SPECIAL TESTS:  Impingement tests: Hawkins/Kennedy impingement test: positive   Rotator cuff assessment: Empty can test: positive  and Full can test: positive   Both tests were painful  Biceps assessment: Yergason's test: positive   PALPATION:  TTP to the upper rap and peri-GH area   TODAY'S TREATMENT:  [facility] Adult PT Treatment: Eval                                               DATE: 07/18/21 Therapeutic Exercise: Standing Shoulder Flexion Wall Walk  5 reps 3 hold  Seated Shoulder Flexion Slide at Table Top with Forearm in Neutral 5 reps 3 hold  PATIENT EDUCATION: Education details: HEP, POC,  Person educated: Patient Education method: Explanation, Demonstration, Tactile cues, Verbal cues, and Handouts Education comprehension: verbalized understanding, returned demonstration, verbal cues required, and tactile cues required   HOME EXERCISE PROGRAM: Access Code: [email or web address] URL: [email or web address] Date: 07/19/2021 Prepared by: Gar Ponto  Exercises - Standing Shoulder Flexion Wall Walk  - 2 x daily - 7 x weekly - 1 sets - 5 reps - 3 hold - Seated Shoulder Flexion Slide at Table Top with Forearm in Neutral  - 2 x daily - 7 x weekly - 1 sets - 5 reps - 3 hold  ASSESSMENT:  CLINICAL IMPRESSION: Patient  is a 56 y.o. F who was seen today for physical therapy evaluation and treatment for Chronic pain of both shoulders. Pt has experienced decreased ability to raise her arms for about 6 months. Pt's AAROM and PROM for each shoulder is similar in mobility. Pt is primarily concerned about her loss of function vs pain. All movements were painful, and impingement and rotator cuff were positive for pain. Strength testing did not reveal overt weakness   OBJECTIVE IMPAIRMENTS decreased activity tolerance, decreased ROM, decreased strength, increased fascial restrictions, increased muscle spasms, impaired UE functional use, postural dysfunction, and pain.   ACTIVITY LIMITATIONS carrying, lifting, bending, sleeping, bed mobility, bathing, toileting, dressing, reach over head, hygiene/grooming, and caring for others  PARTICIPATION LIMITATIONS: meal prep, cleaning, laundry, driving, shopping, community  activity, and yard work  PERSONAL FACTORS Past/current experiences, Time since onset of injury/illness/exacerbation, and 3+ comorbidities:    DDD cervical, fibromyalgia, spinal stenosis cervical are also affecting patient's functional outcome.   REHAB POTENTIAL: Fair due to chronicity  CLINICAL DECISION MAKING: Evolving/moderate complexity  EVALUATION COMPLEXITY: Moderate   GOALS:   LONG TERM GOALS: Target date: 09/05/21   Pt's shoulder AROM will increase to R 105d and L 100d for improved shoulder function Baseline: see flow sheet Goal status: INITIAL  2.  Pt will demonstrate 4/5 shoulder strength for improved shoulder function Baseline: 3/5 Goal status: INITIAL  3.  Pt's FOTO score will improve to 64% as indication of improved function Baseline: 58% Goal status: INITIAL  4.  Pt will be Ind in a HEP to maintain achieved LOF Baseline:  Goal status: INITIAL   PLAN: PT FREQUENCY: 2x/week  PT DURATION: 6 weeks  PLANNED INTERVENTIONS: Therapeutic exercises, Therapeutic activity, Patient/Family  education, Joint mobilization, Aquatic Therapy, Dry Needling, Cryotherapy, Moist heat, Taping, Ultrasound, Ionotophoresis '4mg'$ /ml Dexamethasone, Manual therapy, and Re-evaluation  PLAN FOR NEXT SESSION: Assess response to HEP; review FOTO; use of modalities, manual therapy and TPDN as inidcated   Jaking Thayer MS, PT 07/19/21 7:28 PM

## 2021-07-24 NOTE — Therapy (Signed)
OUTPATIENT PHYSICAL THERAPY TREATMENT NOTE   Patient Name: Tina Watkins MRN: [id number] DOB:October 01, 1965, 56 y.o., female Today's Date: 07/26/2021  PCP: Orion Crook I, NP REFERRING PROVIDER: Fuller Plan, MD  END OF SESSION:   PT End of Session - 07/25/21 1332     Visit Number 2    Number of Visits 13    Date for PT Re-Evaluation 09/05/21    Authorization Type MEDICARE PART A AND B; MEDICAID [place] ACCESS    PT Start Time 1321    PT Stop Time 1400    PT Time Calculation (min) 39 min    Activity Tolerance Patient tolerated treatment well;Patient limited by pain    Behavior During Therapy [facility] for tasks assessed/performed             Past Medical History:  Diagnosis Date   Bacteremia due to Gram-negative bacteria 05/23/2011   Blind    right eye   Blind right eye    CHF (congestive heart failure) (HCC)    Chronic lower back pain    Complication of anesthesia    "woke up during OR; I have an extremely high tolerance" (12/11/2011) 1 procedure was graft; the other procedures were procedures that are typically done with sedation.   DDD (degenerative disc disease), cervical    Depression    Dysrhythmia    "tachycardia" (12/11/2011) new onset afib 10/15/14 EKG   E coli bacteremia 06/18/2011   Elevated LDL cholesterol level 12/2018   ESRD (end stage renal disease) (HCC) 06/12/2011   Fibromyalgia    Gastroparesis    Gastroparesis    GERD (gastroesophageal reflux disease)    Glaucoma    right eye   Gout    H/O carpal tunnel syndrome    Headache 10/2019   Headache(784.0)    "not often anymore" (12/11/2011)   Herpes genitalia 1994   History of blood transfusion    "more than a few times" (12/11/2011)   History of stomach ulcers    Hypotension    Iron deficiency anemia    New onset a-fib (HCC)    10/15/14 EKG   Osteopenia    Peripheral neuropathy 11/2018   Pressure ulcer of sacral region, stage 1 07/2019   Seizures (HCC) 1994   "post transplant; only have  had that one" (12/11/2011)   Spinal stenosis in cervical region    Stroke [facility])     left basal ganglia lacunar infarct; Right frontal lobe lacunar infarct.   Stroke [facility]) ~ 1999; 2001   "briefly lost my vision; lost my right eye" (12/11/2011)   Vitamin D deficiency 12/2018   Past Surgical History:  Procedure Laterality Date   ANTERIOR CERVICAL DECOMP/DISCECTOMY FUSION N/A 01/08/2015   Procedure: Anterior Cervical Three-Four/Four-Five Decompression/Diskectomy/Fusion;  Surgeon: Hilda Lias, MD;  Location: [facility] NEURO ORS;  Service: Neurosurgery;  Laterality: N/A;  C3-4 C4-5 Anterior cervical decompression/diskectomy/fusion   APPENDECTOMY  ~ 2004   BIOPSY  09/12/2020   Procedure: BIOPSY;  Surgeon: Rachael Fee, MD;  Location: [facility] ENDOSCOPY;  Service: Endoscopy;;   CATARACT EXTRACTION     right eye   COLONOSCOPY     COLONOSCOPY WITH PROPOFOL N/A 09/12/2020   Procedure: COLONOSCOPY WITH PROPOFOL;  Surgeon: Rachael Fee, MD;  Location: [facility] ENDOSCOPY;  Service: Endoscopy;  Laterality: N/A;   ENUCLEATION  2001   "right"   ESOPHAGOGASTRODUODENOSCOPY (EGD) WITH PROPOFOL N/A 04/21/2012   Procedure: ESOPHAGOGASTRODUODENOSCOPY (EGD) WITH PROPOFOL;  Surgeon: Rachael Fee, MD;  Location: [facility] ENDOSCOPY;  Service:  Endoscopy;  Laterality: N/A;   ESOPHAGOGASTRODUODENOSCOPY (EGD) WITH PROPOFOL N/A 09/12/2020   Procedure: ESOPHAGOGASTRODUODENOSCOPY (EGD) WITH PROPOFOL;  Surgeon: Rachael Fee, MD;  Location: [facility] ENDOSCOPY;  Service: Endoscopy;  Laterality: N/A;   INSERTION OF DIALYSIS CATHETER  1988   "AV graft LUA & LFA; LUA worked for 1 day; LFA never worked"   IR FLUORO GUIDE CV LINE RIGHT  07/11/2019   IR FLUORO GUIDE CV LINE RIGHT  10/20/2019   IR FLUORO GUIDE CV LINE RIGHT  05/31/2020   IR PTA VENOUS EXCEPT DIALYSIS CIRCUIT  05/31/2020   IR RADIOLOGY PERIPHERAL GUIDED IV START  07/11/2019   IR US GUIDE VASC ACCESS RIGHT  07/11/2019   IR US GUIDE VASC ACCESS RIGHT  07/11/2019   IR US GUIDE VASC ACCESS  RIGHT  07/11/2019   IR VENOCAVAGRAM IVC  05/31/2020   KIDNEY TRANSPLANT  1994; 1999; 2005   "right"   MULTIPLE TOOTH EXTRACTIONS     POLYPECTOMY  09/12/2020   Procedure: POLYPECTOMY;  Surgeon: Rachael Fee, MD;  Location: [facility] ENDOS[facility];  Service: Endoscopy;;   RIGHT HEART CATH N/A 03/23/2019   Procedure: RIGHT HEART CATH;  Surgeon: Dolores Patty, MD;  Location: [facility] INVAS[facility] CV [facility];  Service: Cardiovascular;  Laterality: N/A;   TONSILLECTOMY     TOTAL NEPHRECTOMY  1988?; 1994; 2005   Patient Active Problem List   Diagnosis Date Noted   Bilateral shoulder pain 06/06/2021   Bilateral hand swelling 06/06/2021   Cervical radiculitis 01/24/2021   Acute respiratory failure with hypoxia (HCC) 10/23/2020   Coagulation defect, unspecified (HCC) 08/01/2020   Aortic atherosclerosis (HCC) 07/10/2020   Secondary hypercoagulable state (HCC) 04/23/2020   Atrial flutter (HCC) 03/29/2020   Chronic pain of both knees 03/13/2020   Chills (without fever) 12/12/2019   Other pancytopenia (HCC)    Decubitus ulcer of sacral region, stage 1 08/18/2019   HCAP (healthcare-associated pneumonia) 08/05/2019   Pressure injury of skin 07/24/2019   Chest congestion 07/24/2019   Itching 07/24/2019   Weakness 06/13/2019   Pneumonia due to COVID-19 virus 05/09/2019   Hypotension 05/09/2019   Symptomatic anemia 05/09/2019   CHF (congestive heart failure) (HCC) 05/09/2019   Swollen abdomen 01/04/2019   DDD (degenerative disc disease), cervical 12/06/2018   Neuropathy 12/06/2018   Paresthesia 10/11/2018   Neck pain 10/11/2018   Tobacco abuse 11/20/2017   Right leg swelling 11/20/2017   Right leg pain 11/20/2017   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   GERD (gastroesophageal reflux disease) 11/20/2017   Acute venous embolism and thrombosis of deep vessels of proximal lower extremity, right (HCC) 11/20/2017   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    Thrush, oral    AKI  (acute kidney injury) (HCC)    Multifocal pneumonia 08/09/2017   ETD (Eustachian tube dysfunction), bilateral 08/03/2017   Acute recurrent pansinusitis 07/21/2017   Chest pain 09/29/2015   Cervical stenosis of spinal canal 01/08/2015   Urinary tract infectious disease    Muscle spasms of neck 06/15/2014   Bleeding hemorrhoid 06/15/2014   Anemia in chronic kidney disease 06/15/2014   Pyelonephritis, acute 06/13/2014   Sepsis (HCC) 06/13/2014   History of kidney transplant    Abnormal CT scan 09/14/2012   Chronic pain syndrome 04/09/2012   Dehydration, mild 04/09/2012   Herpes infection 06/23/2011   Anxiety 06/23/2011   E coli bacteremia 06/18/2011   ESRD (end stage renal disease) (HCC) 06/12/2011   UTI (lower urinary tract  infection) 06/12/2011   Bacteremia due to Gram-negative bacteria 05/23/2011   History of renal transplantation 05/22/2011   Septic shock(785.52) 05/22/2011   Acute on chronic kidney failure (HCC) 05/22/2011   Gastroparesis 04/24/2008   WEIGHT LOSS 08/24/2007   NAUSEA AND VOMITING 08/24/2007    REFERRING DIAG: M25.511,G89.29,M25.512 (ICD-10-CM) - Chronic pain of both shoulders  THERAPY DIAG:  Chronic left shoulder pain  Chronic right shoulder pain  Stiffness of left shoulder, not elsewhere classified  Stiffness of right shoulder, not elsewhere classified  Muscle weakness (generalized)  Rationale for Evaluation and Treatment Rehabilitation  SUBJECTIVE:                                                                                                                                                                                       SUBJECTIVE STATEMENT: Pt reports her shoulders are feeling better after receiving cortisone injections   PERTINENT HISTORY: DDD cervical, fibromyalgia, spinal stenosis cervical, CHF   PAIN:  Are you having pain? Yes: NPRS scale: 4/10 Pain location: Upper shoulders to GH jt to upper arms Pain description: ache and  throb Aggravating factors: Just using my arms Relieving factors: Meds, pain meds Pain range: 5-9/10  PERTINENT HISTORY: DDD cervical, fibromyalgia, spinal stenosis cervical, CHF   PRECAUTIONS: None   PATIENT GOALS More strength and mobility of her  shoulders   DIAGNOSTIC FINDINGS:  X-ray right shoulder 4 views 06/20/21  Glenohumeral joint space appears normal.  Normal internal and external  rotation of humerus.  AC joint space appears normal.  No visible erosions  or abnormal calcifications seen.   Impression   Normal appearing shoulder x-ray   X-ray left shoulder 4 views 06/20/21  Glenohumeral joint space appears normal.  Normal internal and external  rotation of humerus.  AC joint space appears normal.  No visible erosions  or abnormal calcifications seen.   Impression   Normal appearing shoulder x-ray   PATIENT SURVEYS:  FOTO 58% predicted 64%   COGNITION:           Overall cognitive status: Within functional limits for tasks assessed                                  SENSATION: WFL   POSTURE: Forward head and rounded shoulders   UPPER EXTREMITY ROM:     AROM/AAROM/PROM Right eval Left eval  Shoulder flexion 85/118/115 88/110/110  Shoulder extension      Shoulder abduction      Shoulder adduction      Shoulder internal rotation      Shoulder external rotation  Elbow flexion      Elbow extension      Wrist flexion      Wrist extension      Wrist ulnar deviation      Wrist radial deviation      Wrist pronation      Wrist supination      (Blank rows = not tested)   UPPER EXTREMITY MMT: Tested strength in a neutral position due to pain. Pt demonstrated strength against resistance seeming at least 3/5. No overt weakness noted. MMT Right eval Left eval  Shoulder flexion      Shoulder extension      Shoulder abduction      Shoulder adduction      Shoulder internal rotation      Shoulder external rotation      Middle trapezius      Lower  trapezius      Elbow flexion      Elbow extension      Wrist flexion      Wrist extension      Wrist ulnar deviation      Wrist radial deviation      Wrist pronation      Wrist supination      Grip strength (lbs)      (Blank rows = not tested)   SHOULDER SPECIAL TESTS:            Impingement tests: Hawkins/Kennedy impingement test: positive             Rotator cuff assessment: Empty can test: positive  and Full can test: positive             Both tests were painful            Biceps assessment: Yergason's test: positive    PALPATION:  TTP to the upper rap and peri-GH area             TODAY'S TREATMENT:  [facility] Adult PT Treatment:                                                DATE: 07/25/21 Therapeutic Exercise: Nustep  6 mins L1 UE/LE Shoulder flexion on table top 2x10 Shoulder flexion wall slides x10 Shoulder row 2x10 YTB Shoulder ext 2x10 YTB  [facility] Adult PT Treatment: Eval                                               DATE: 07/18/21 Therapeutic Exercise: Standing Shoulder Flexion Wall Walk  5 reps 3 hold  Seated Shoulder Flexion Slide at Table Top with Forearm in Neutral 5 reps 3 hold   PATIENT EDUCATION: Education details: HEP, POC,  Person educated: Patient Education method: Explanation, Demonstration, Tactile cues, Verbal cues, and Handouts Education comprehension: verbalized understanding, returned demonstration, verbal cues required, and tactile cues required     HOME EXERCISE PROGRAM: Access Code: [email or web address] URL: [email or web address] Date: 07/26/2021 Prepared by: Joellyn Rued  Exercises - Standing Shoulder Flexion Wall Walk  - 2 x daily - 7 x weekly - 1 sets - 10 reps - 3 hold - Seated Shoulder Flexion Slide at Table Top with Forearm in Neutral  - 2 x daily - 7 x weekly - 1 sets - 10  reps - 3 hold - Standing Shoulder Row with Anchored Resistance  - 2 x daily - 7 x weekly - 1 sets - 10 reps - 3 hold - Shoulder extension with resistance - Neutral  -  2 x daily - 7 x weekly - 1 sets - 10 reps - 3 hold   ASSESSMENT:   CLINICAL IMPRESSION: Pt participated in PT for shoulder ROM and gentle strength using AAROM and posterior chain strengthening therex. Pt's shoulder pain is decreased following cortisone injections and therex today was tolerated without adverse effects. Pt's HEP was updated.    OBJECTIVE IMPAIRMENTS decreased activity tolerance, decreased ROM, decreased strength, increased fascial restrictions, increased muscle spasms, impaired UE functional use, postural dysfunction, and pain.    ACTIVITY LIMITATIONS carrying, lifting, bending, sleeping, bed mobility, bathing, toileting, dressing, reach over head, hygiene/grooming, and caring for others   PARTICIPATION LIMITATIONS: meal prep, cleaning, laundry, driving, shopping, community activity, and yard work   PERSONAL FACTORS Past/current experiences, Time since onset of injury/illness/exacerbation, and 3+ comorbidities:    DDD cervical, fibromyalgia, spinal stenosis cervical are also affecting patient's functional outcome.    REHAB POTENTIAL: Fair due to chronicity   CLINICAL DECISION MAKING: Evolving/moderate complexity   EVALUATION COMPLEXITY: Moderate     GOALS:     LONG TERM GOALS: Target date: 09/05/21    Pt's shoulder AROM will increase to R 105d and L 100d for improved shoulder function Baseline: see flow sheet Goal status: INITIAL   2.  Pt will demonstrate 4/5 shoulder strength for improved shoulder function Baseline: 3/5 Goal status: INITIAL   3.  Pt's FOTO score will improve to 64% as indication of improved function Baseline: 58% Goal status: INITIAL   4.  Pt will be Ind in a HEP to maintain achieved LOF Baseline:  Goal status: INITIAL     PLAN: PT FREQUENCY: 2x/week   PT DURATION: 6 weeks   PLANNED INTERVENTIONS: Therapeutic exercises, Therapeutic activity, Patient/Family education, Joint mobilization, Aquatic Therapy, Dry Needling, Cryotherapy, Moist  heat, Taping, Ultrasound, Ionotophoresis 4mg /ml Dexamethasone, Manual therapy, and Re-evaluation   PLAN FOR NEXT SESSION: Assess response to HEP; review FOTO; use of modalities, manual therapy and TPDN as inidcated    Elias Bordner MS, PT 07/26/21 1:37 AM

## 2021-07-25 ENCOUNTER — Ambulatory Visit: Payer: Medicare Other

## 2021-07-25 DIAGNOSIS — M25512 Pain in left shoulder: Secondary | ICD-10-CM | POA: Diagnosis not present

## 2021-07-25 DIAGNOSIS — G8929 Other chronic pain: Secondary | ICD-10-CM

## 2021-07-25 DIAGNOSIS — M25612 Stiffness of left shoulder, not elsewhere classified: Secondary | ICD-10-CM

## 2021-07-25 DIAGNOSIS — M25611 Stiffness of right shoulder, not elsewhere classified: Secondary | ICD-10-CM

## 2021-07-25 DIAGNOSIS — M6281 Muscle weakness (generalized): Secondary | ICD-10-CM

## 2021-07-26 IMAGING — US US RENAL
1 series · 14 of 23 positions shown · non-contrast
Comparison: None.

CLINICAL DATA: APANAVICIUS, renal transplant

EXAM:
RENAL / URINARY TRACT ULTRASOUND COMPLETE

[Series 1: us renal · 14 of 23 slices shown]
[im 1/23]
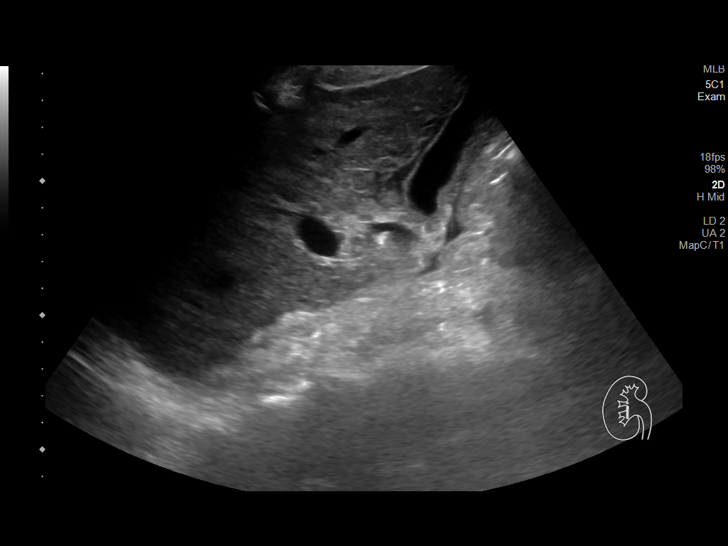
[im 3/23]
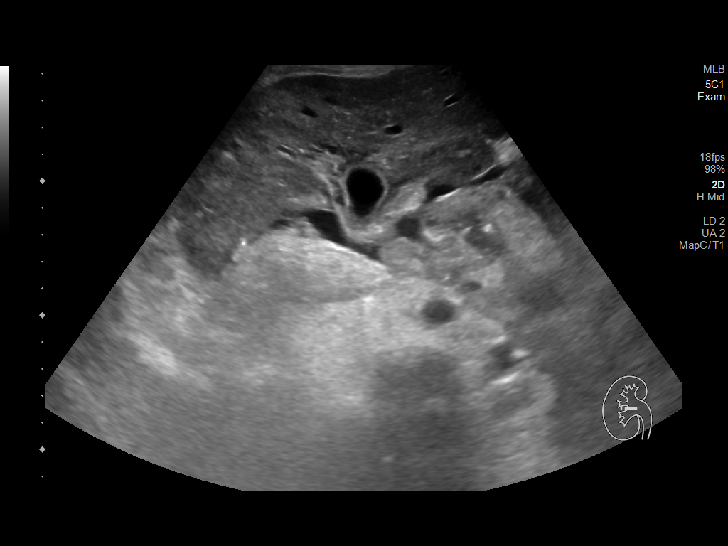
[im 5/23]
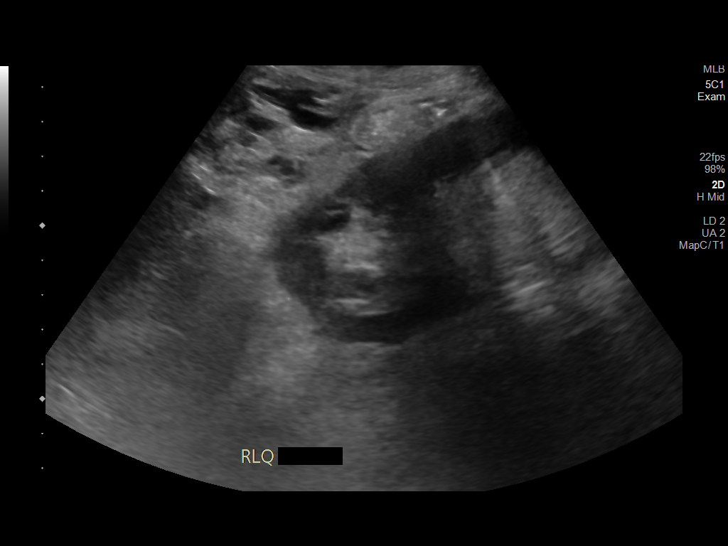
[im 6/23]
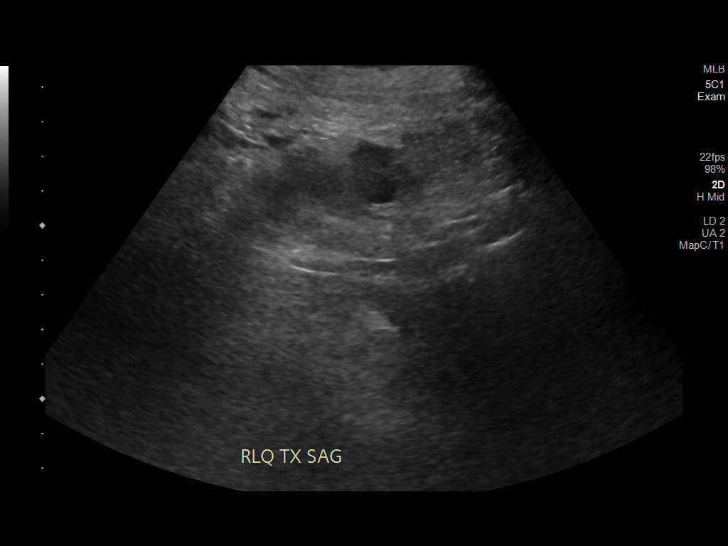
[im 8/23]
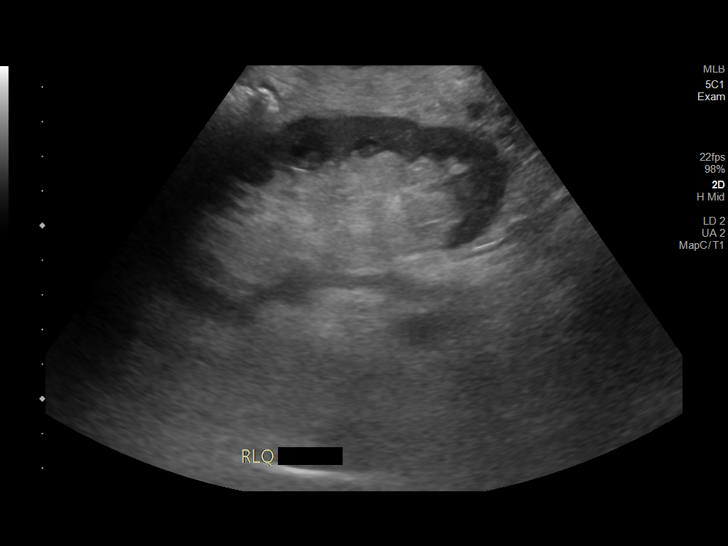
[im 10/23]
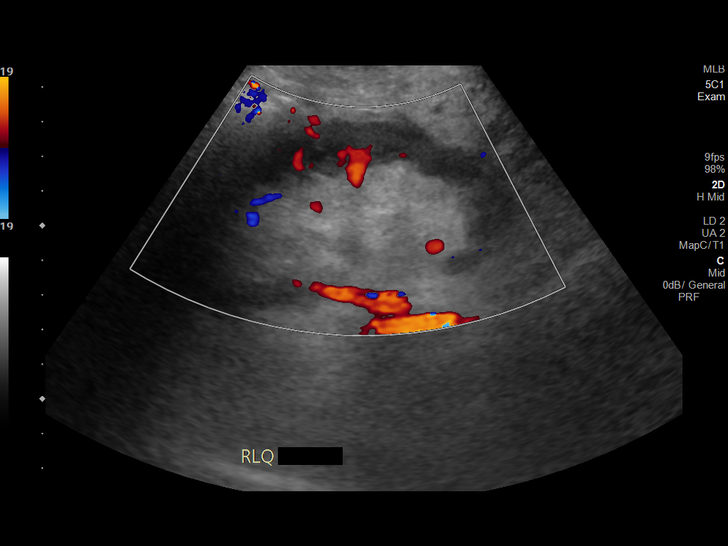
[im 11/23]
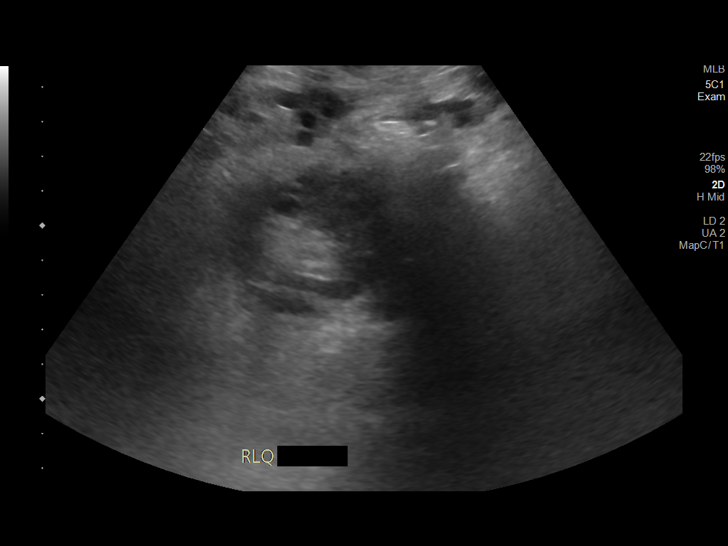
[im 13/23]
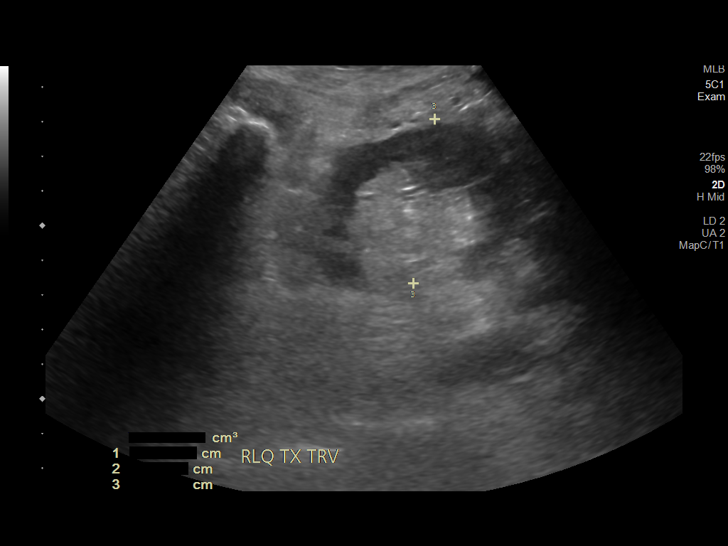
[im 14/23]
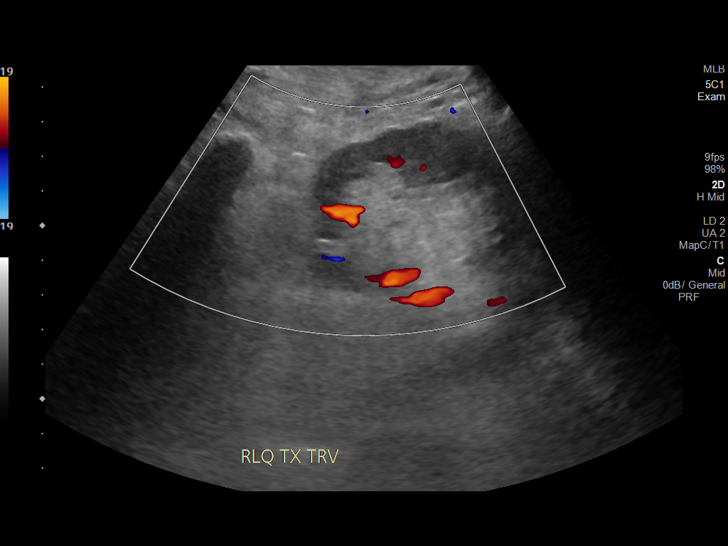
[im 16/23]
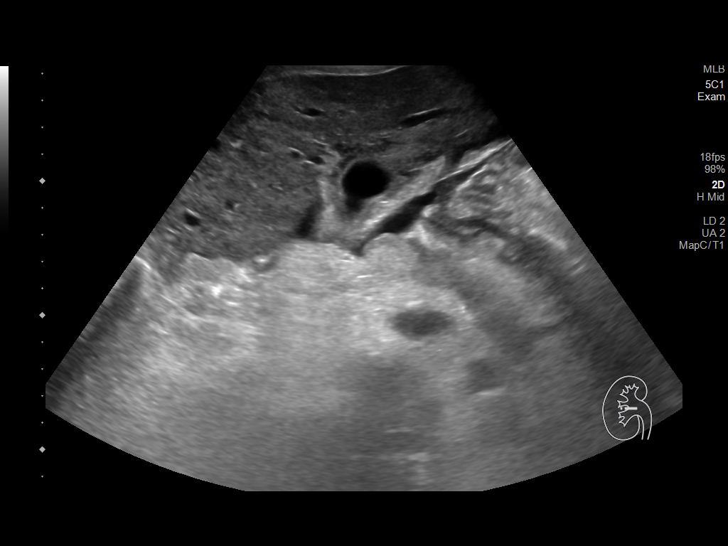
[im 18/23]
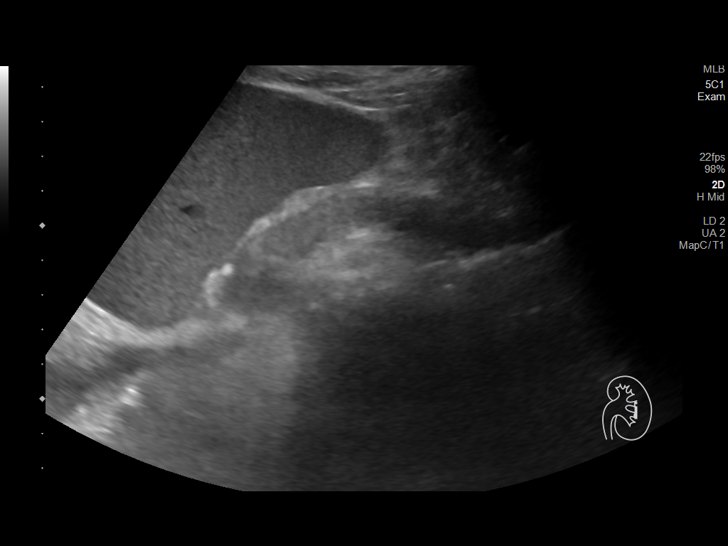
[im 19/23]
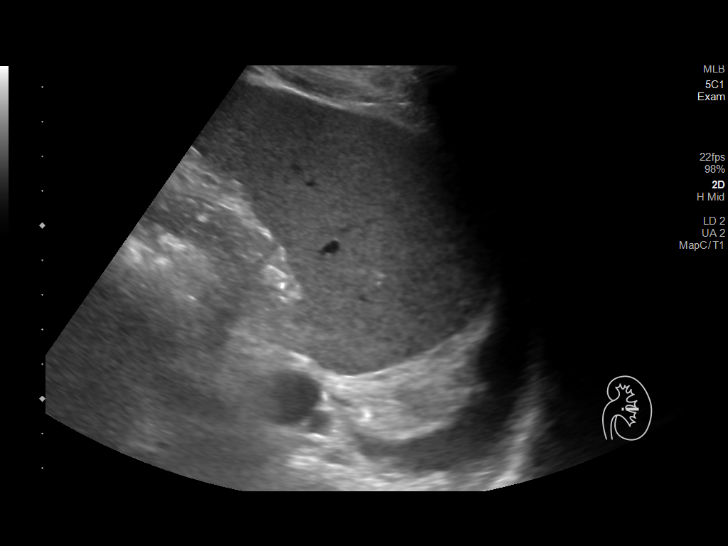
[im 21/23]
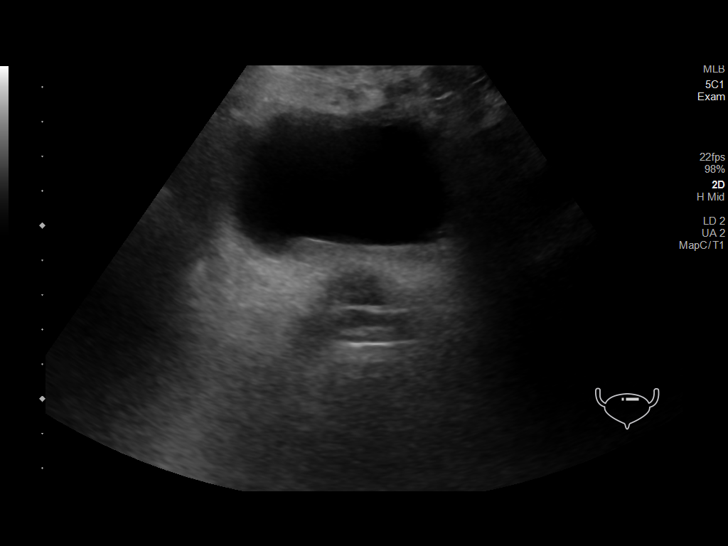
[im 23/23]
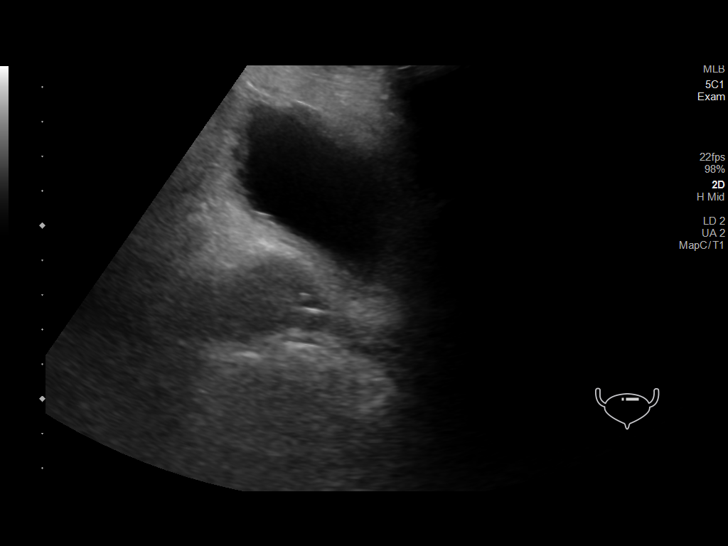

[14 of 23 positions shown; findings below may reference images not displayed]

FINDINGS: Kidneys:

Native kidneys are absent surgically. Right lower quadrant
transplant kidney measures 10.7 x 4.9 x 4.8 cm = volume 131.5 mL. No
hydronephrosis.

Bladder:

Appears normal for degree of bladder distention.

Other:

None.
IMPRESSION: Bilateral nephrectomy with right lower quadrant transplant. No
hydronephrosis.

## 2021-07-27 IMAGING — US US RENAL TRANSPLANT
1 series · 13 of 25 positions shown · non-contrast
Comparison: August 12, 2017

CLINICAL DATA: Acute kidney injury

EXAM:
ULTRASOUND OF RENAL TRANSPLANT WITH RENAL DOPPLER ULTRASOUND
TECHNIQUE: Ultrasound examination of the renal transplant was performed with
gray-scale, color and duplex doppler evaluation.

[Series 1: us renal transplant · 13 of 50 slices shown]
[im 1/50]
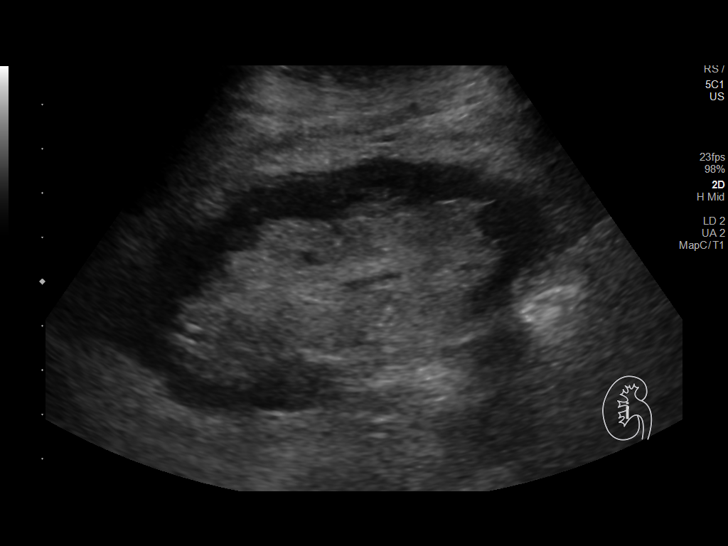
[im 5/50]
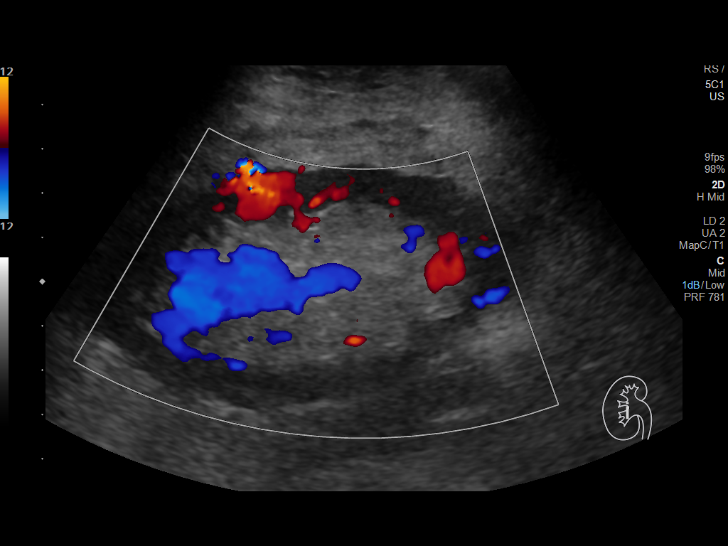
[im 9/50]
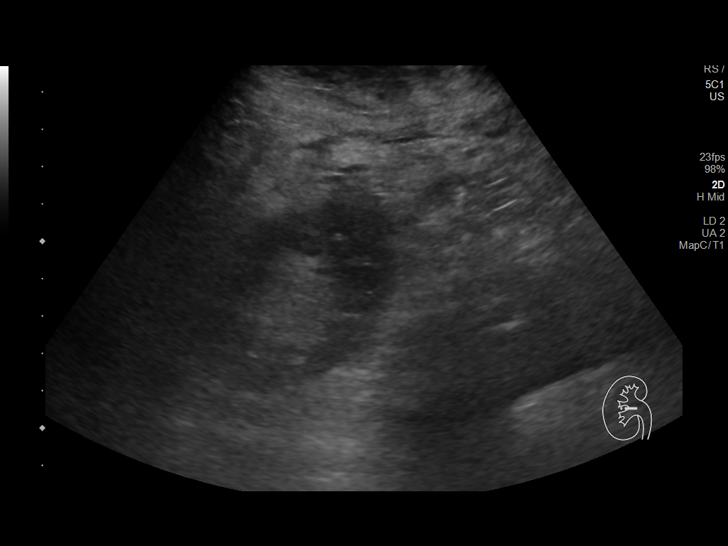
[im 13/50]
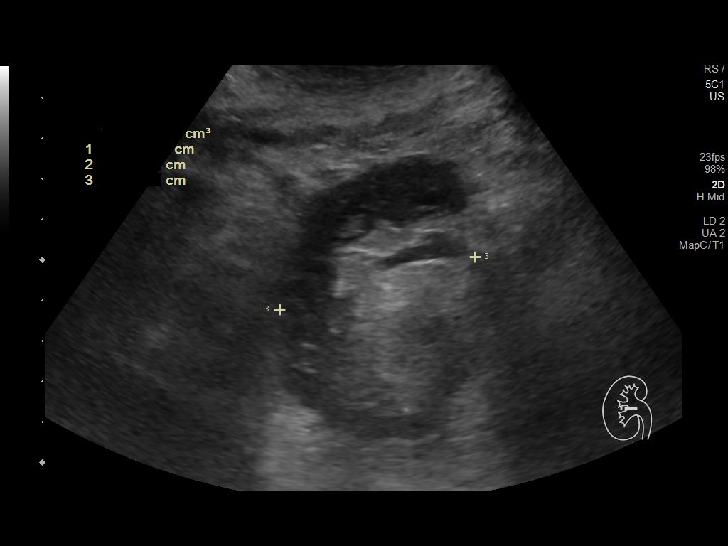
[im 17/50]
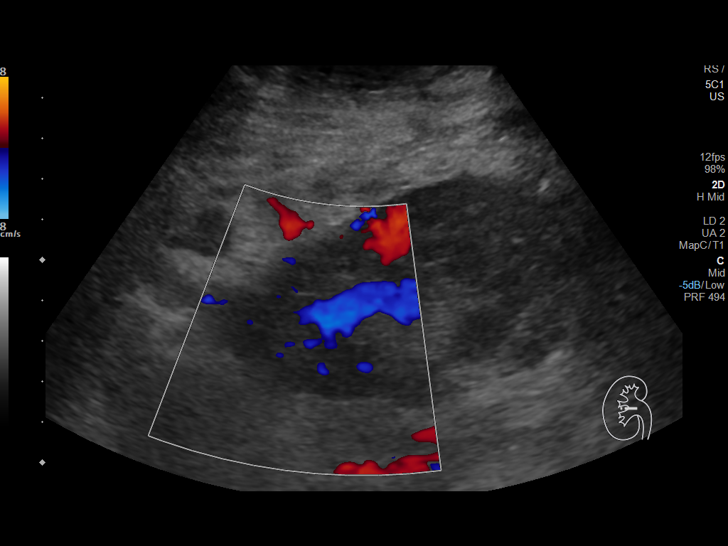
[im 21/50]
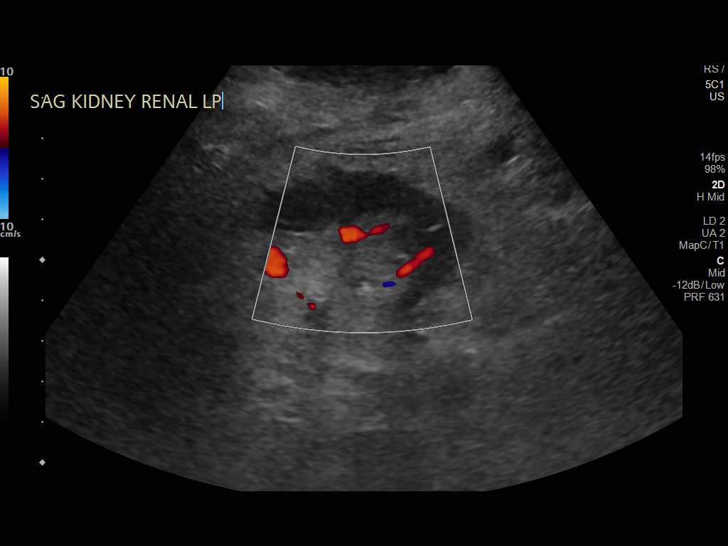
[im 25/50]
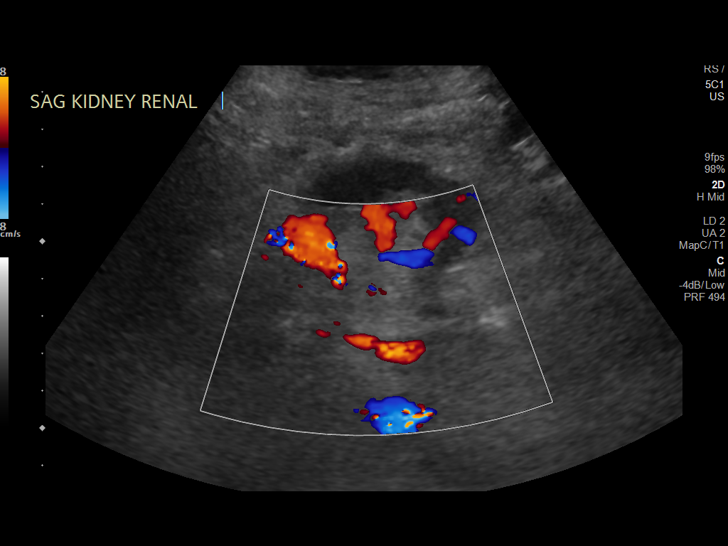
[im 29/50]
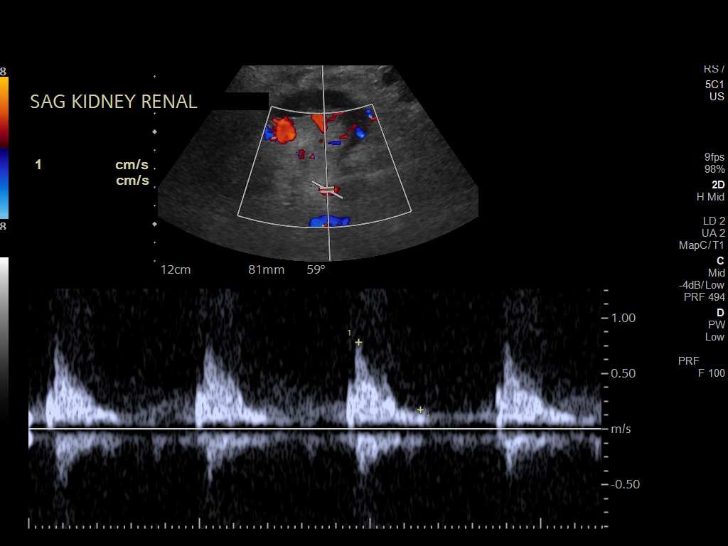
[im 33/50]
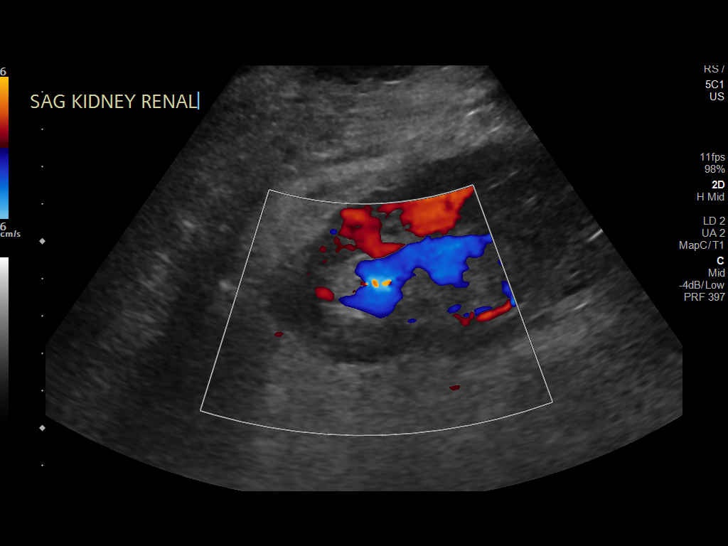
[im 37/50]
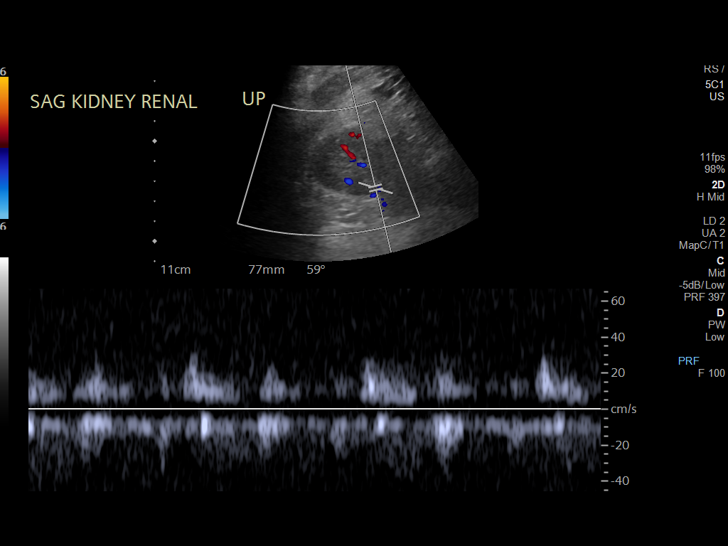
[im 41/50]
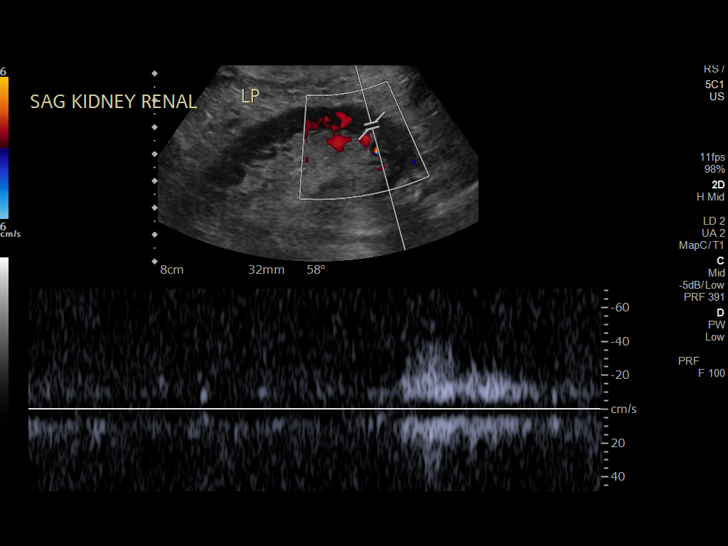
[im 45/50]
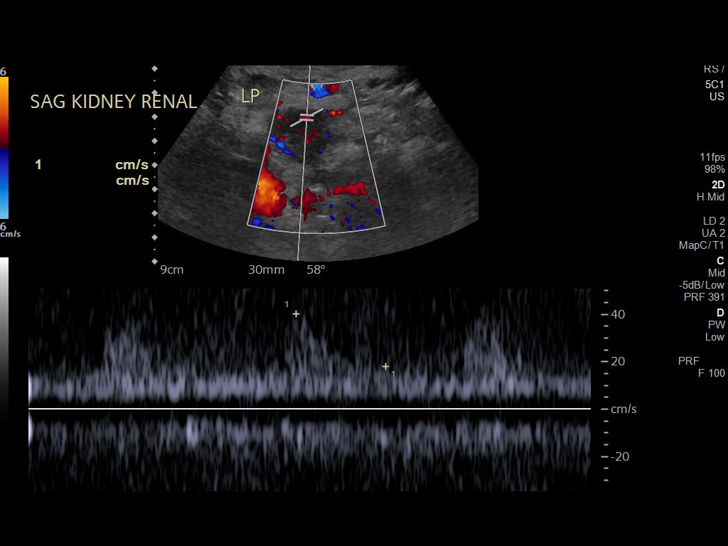
[im 50/50]
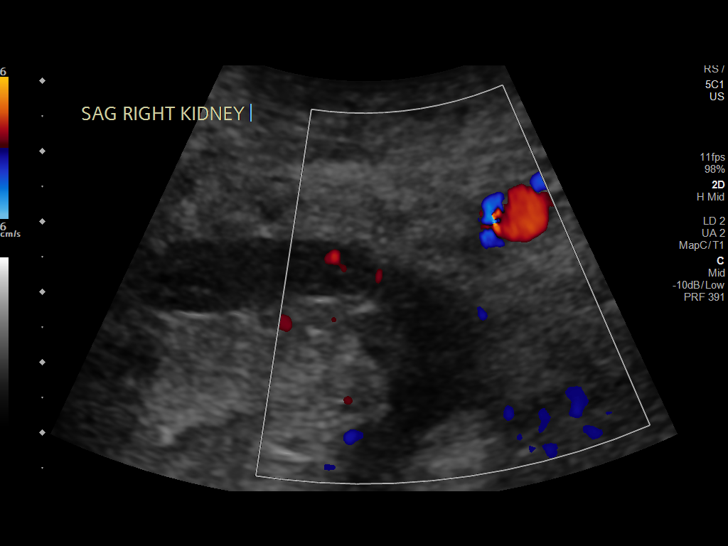

[13 of 25 positions shown; findings below may reference images not displayed]

FINDINGS: Transplant kidney location: RLQ

Transplant Kidney:

Renal measurements: 10.8 x 5.6 x 5 cm = volume: There is 157mL.
Normal in size and parenchymal echogenicity. No evidence of mass or
hydronephrosis. No peri-transplant fluid collection seen.

Color flow in the main renal artery:  Yes

Color flow in the main renal vein:  Yes

Duplex Doppler Evaluation:

Main Renal Artery Resistive Index:

Venous waveform in main renal vein:  Present

Intrarenal resistive index in upper pole:

(normal 0.6-0.8; equivocal 0.8-0.9; abnormal >= 0.9)

Intrarenal resistive index in lower pole:

(normal 0.6-0.8; equivocal 0.8-0.9; abnormal >= 0.9)

Bladder: The bladder is decompressed which limits evaluation.

Other findings:  None.
IMPRESSION: 1. Renal transplant in the right lower quadrant without evidence for
hydronephrosis.
2. Patent main renal artery and vein.
3. Relatively normal resistive indices without significant change
from study in [id number].

## 2021-07-27 IMAGING — DX DG ABD PORTABLE 1V
1 series · 1 of 1 positions shown · non-contrast
Comparison: Plain films of the abdomen 06/17/2015.

CLINICAL DATA: Abdominal pain.

EXAM:
PORTABLE ABDOMEN - 1 VIEW

[abdomen]
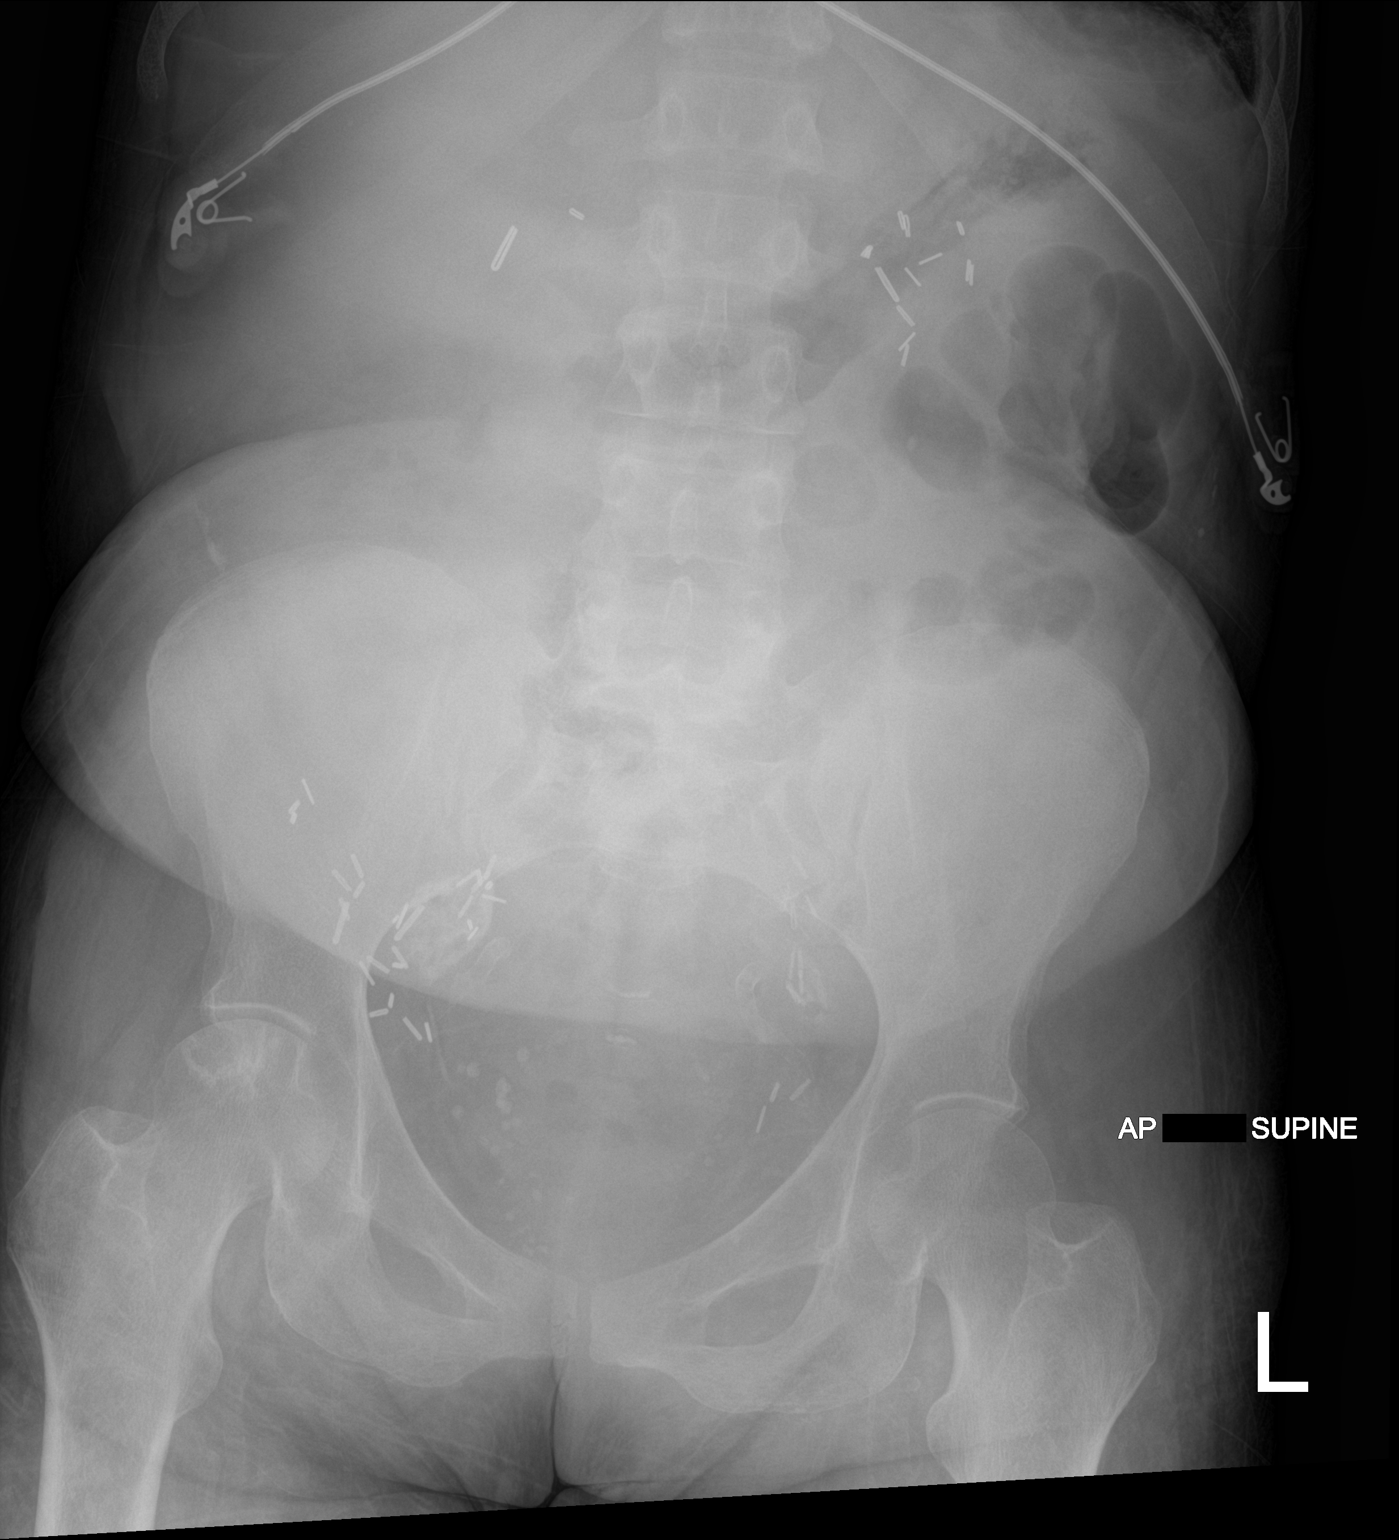

[1 of 1 positions shown; findings below may reference images not displayed]

FINDINGS: The bowel gas pattern is normal. No radio-opaque calculi or other
significant radiographic abnormality are seen. Multiple surgical
clips in the pelvis and upper abdomen are unchanged.
IMPRESSION: No acute finding.

## 2021-07-29 IMAGING — CT CT ABD-PELV W/O CM
2 of 4 series · 16 of 46 positions shown, 18 images · non-contrast
Comparison: 08/09/2017, 08/25/2018

CLINICAL DATA: Abdominal pain and vomiting, 2E5A3-LA positivity

EXAM:
CT ABDOMEN AND PELVIS WITHOUT CONTRAST
TECHNIQUE: Multidetector CT imaging of the abdomen and pelvis was performed
following the standard protocol without IV contrast.

[Series 3: abd/ pelvis 5.0 i30f 2 · axial · 0.72mm/px · z∈[+836,+1201]mm · 13 of 81 slices shown, 15 images]
[im 4/81  soft-tissue]
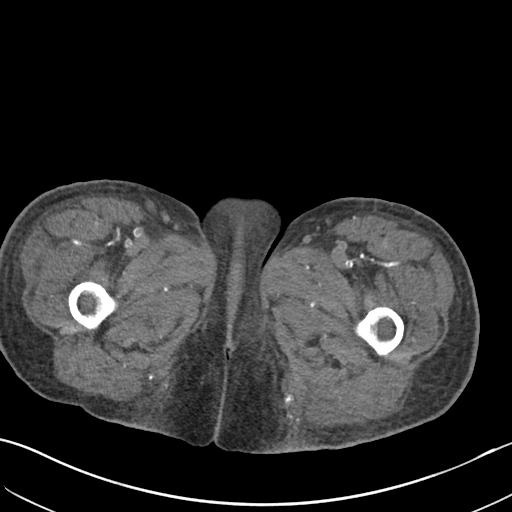
[im 4/81  bone]
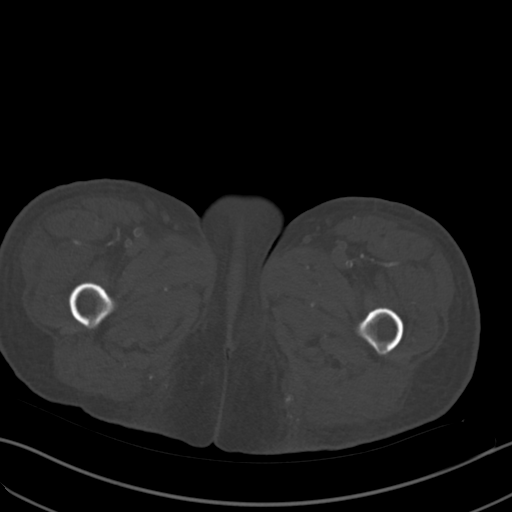
[im 11/81  soft-tissue]
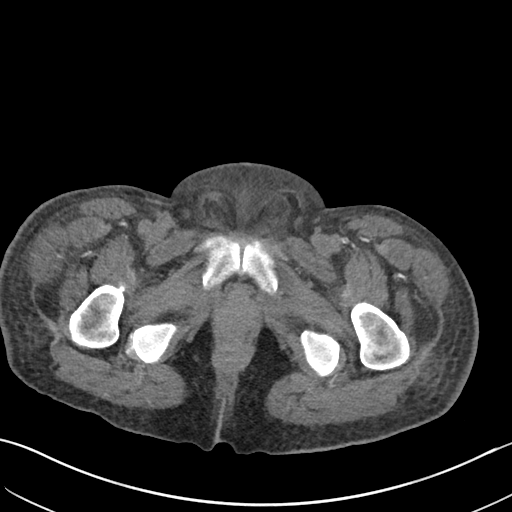
[im 18/81  soft-tissue]
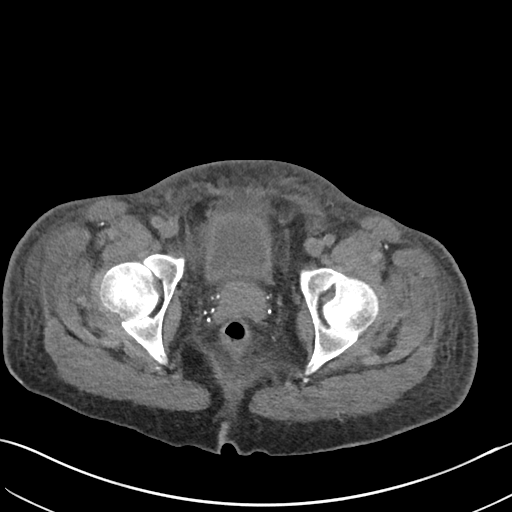
[im 21/81  soft-tissue]
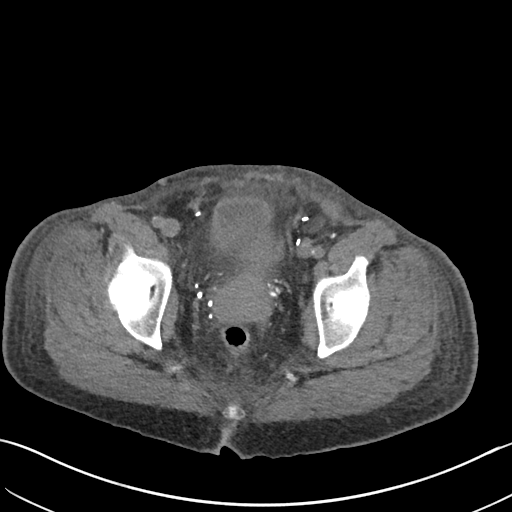
[im 28/81  soft-tissue]
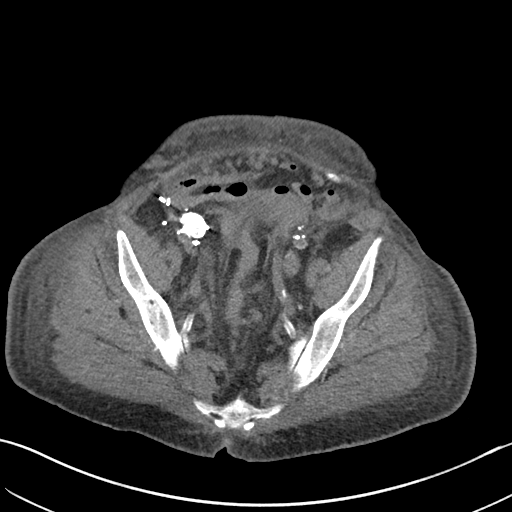
[im 35/81  soft-tissue]
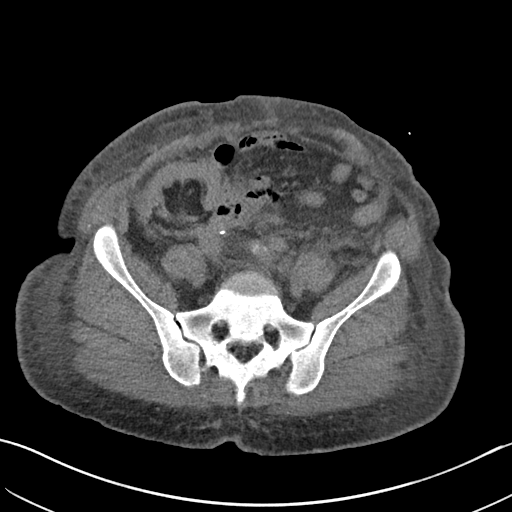
[im 42/81  soft-tissue]
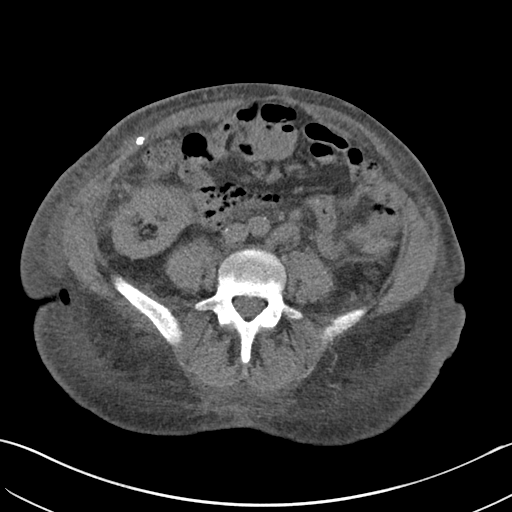
[im 46/81  soft-tissue]
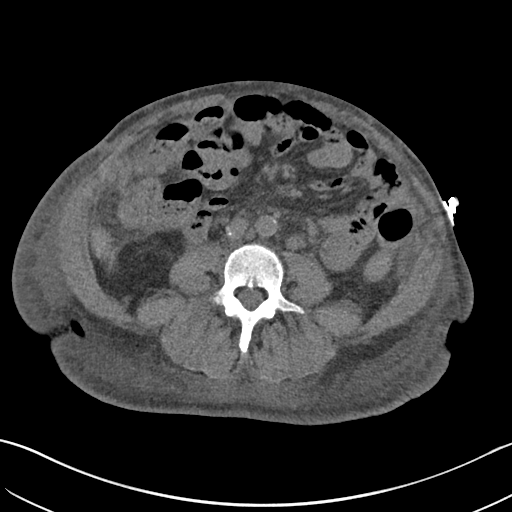
[im 53/81  soft-tissue]
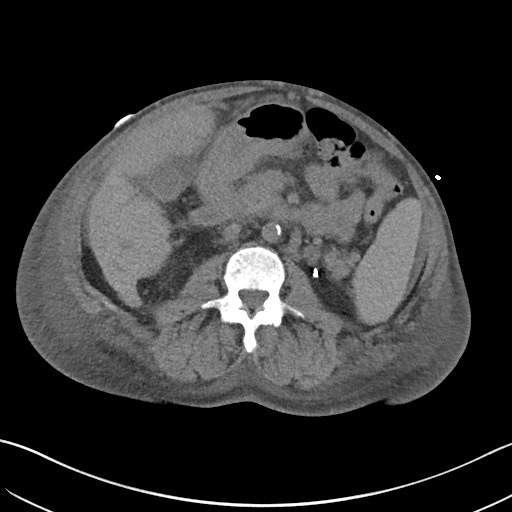
[im 53/81  bone]
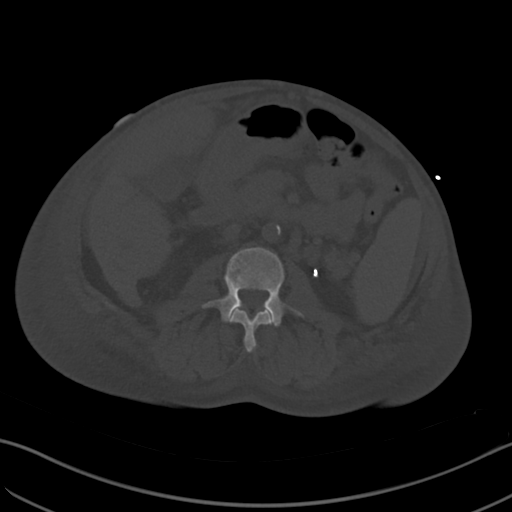
[im 60/81  soft-tissue]
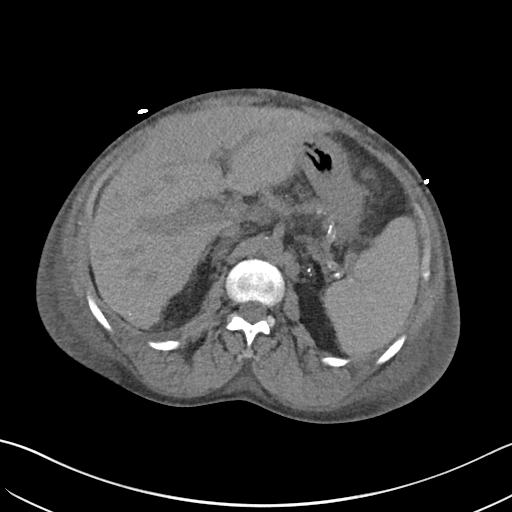
[im 63/81  soft-tissue]
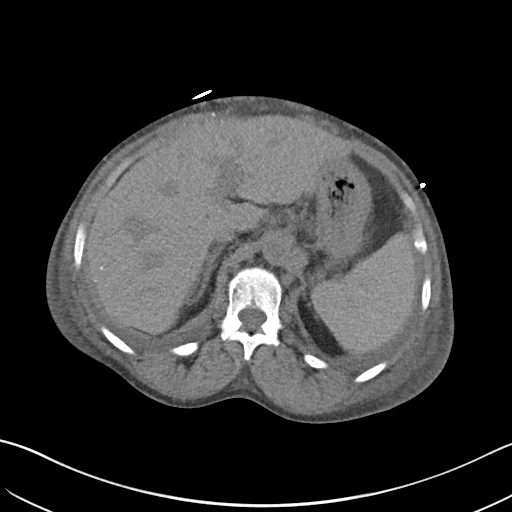
[im 70/81  soft-tissue]
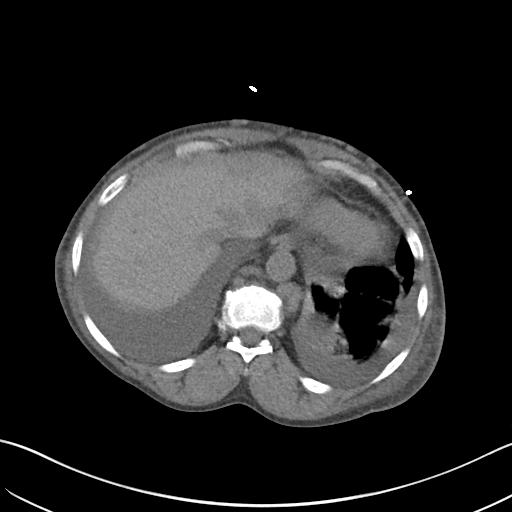
[im 77/81  soft-tissue]
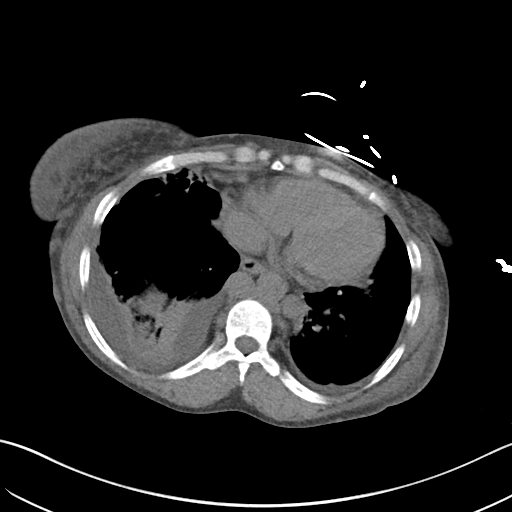

[Series 6: cor st · coronal · 0.65mm/px · 3 of 101 slices shown]
[im 34/101  soft-tissue]
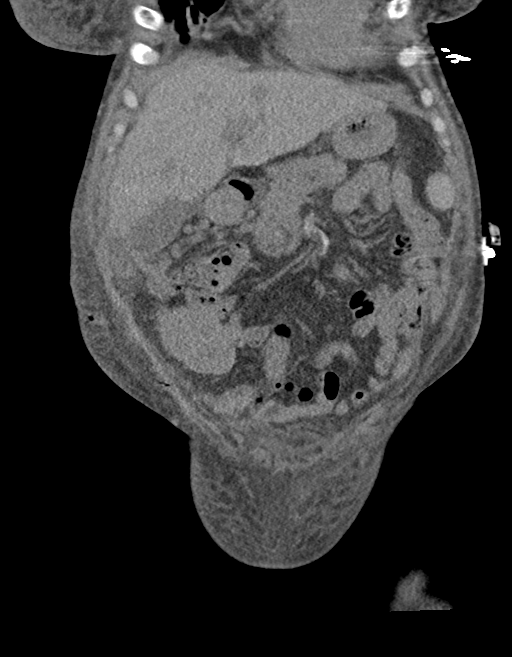
[im 45/101  soft-tissue]
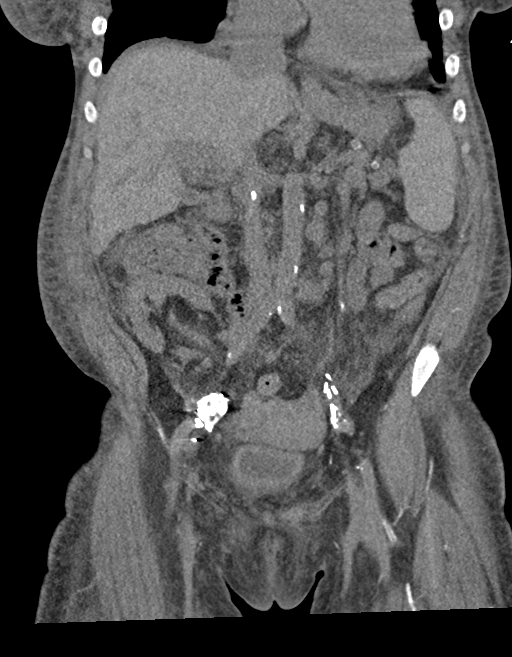
[im 56/101  soft-tissue]
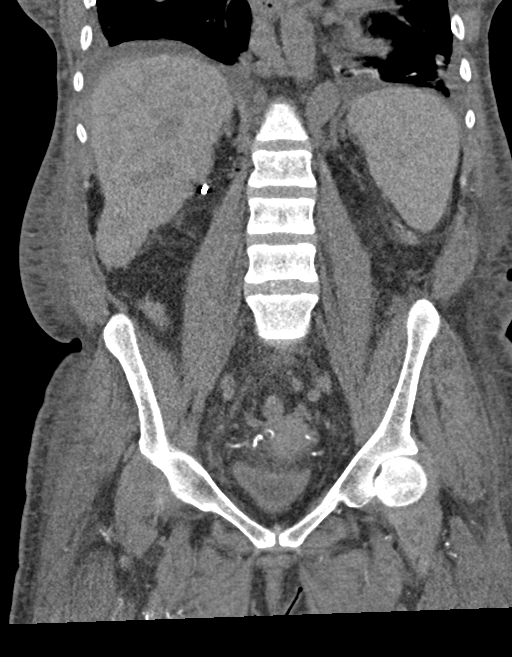

[16 of 46 positions shown; findings below may reference images not displayed]

FINDINGS: Lower chest: Bilateral pleural effusions are noted right greater
than left with associated compressive atelectatic changes.

Hepatobiliary: No focal liver abnormality is seen. No gallstones,
gallbladder wall thickening, or biliary dilatation.

Pancreas: Unremarkable. No pancreatic ductal dilatation or
surrounding inflammatory changes.

Spleen: Splenic granulomas are noted.

Adrenals/Urinary Tract: Adrenal glands appear within normal limits.
The native kidneys have been surgically removed.

A transplant kidney is noted in the right pelvis. No obstructive
changes are seen. A failed transplant kidney is noted in the pelvis
with heavy calcification.

Stomach/Bowel: No obstructive or inflammatory changes of the bowel
are noted.

Vascular/Lymphatic: Vascular calcifications are noted. Some stable
retroperitoneal lymph nodes are seen. Given their long-term
stability there felt to be benign in etiology. Prominence of the
azygos and hemi azygous system in the retrocrural space is noted.

Reproductive: Uterus and bilateral adnexa are unremarkable.

Other: Changes of diffuse anasarca.  Minimal ascites is seen.

Musculoskeletal: No acute bony abnormality is noted. Chronic changes
of avascular necrosis in the right femoral head are seen.
IMPRESSION: Minimal ascites and mild changes of anasarca.

Bilateral pleural effusions right greater than left with associated
basilar atelectasis.

Renal transplant kidney and findings of bilateral total nephrectomy.

No acute abnormality is noted within the abdomen.

## 2021-08-01 ENCOUNTER — Ambulatory Visit: Payer: Medicare Other

## 2021-08-02 IMAGING — DX DG CHEST 1V PORT
1 series · 1 of 1 positions shown · non-contrast
Comparison: 05/08/2019

CLINICAL DATA: History of B3E2R-OT positivity

EXAM:
PORTABLE CHEST 1 VIEW

[chest ap]
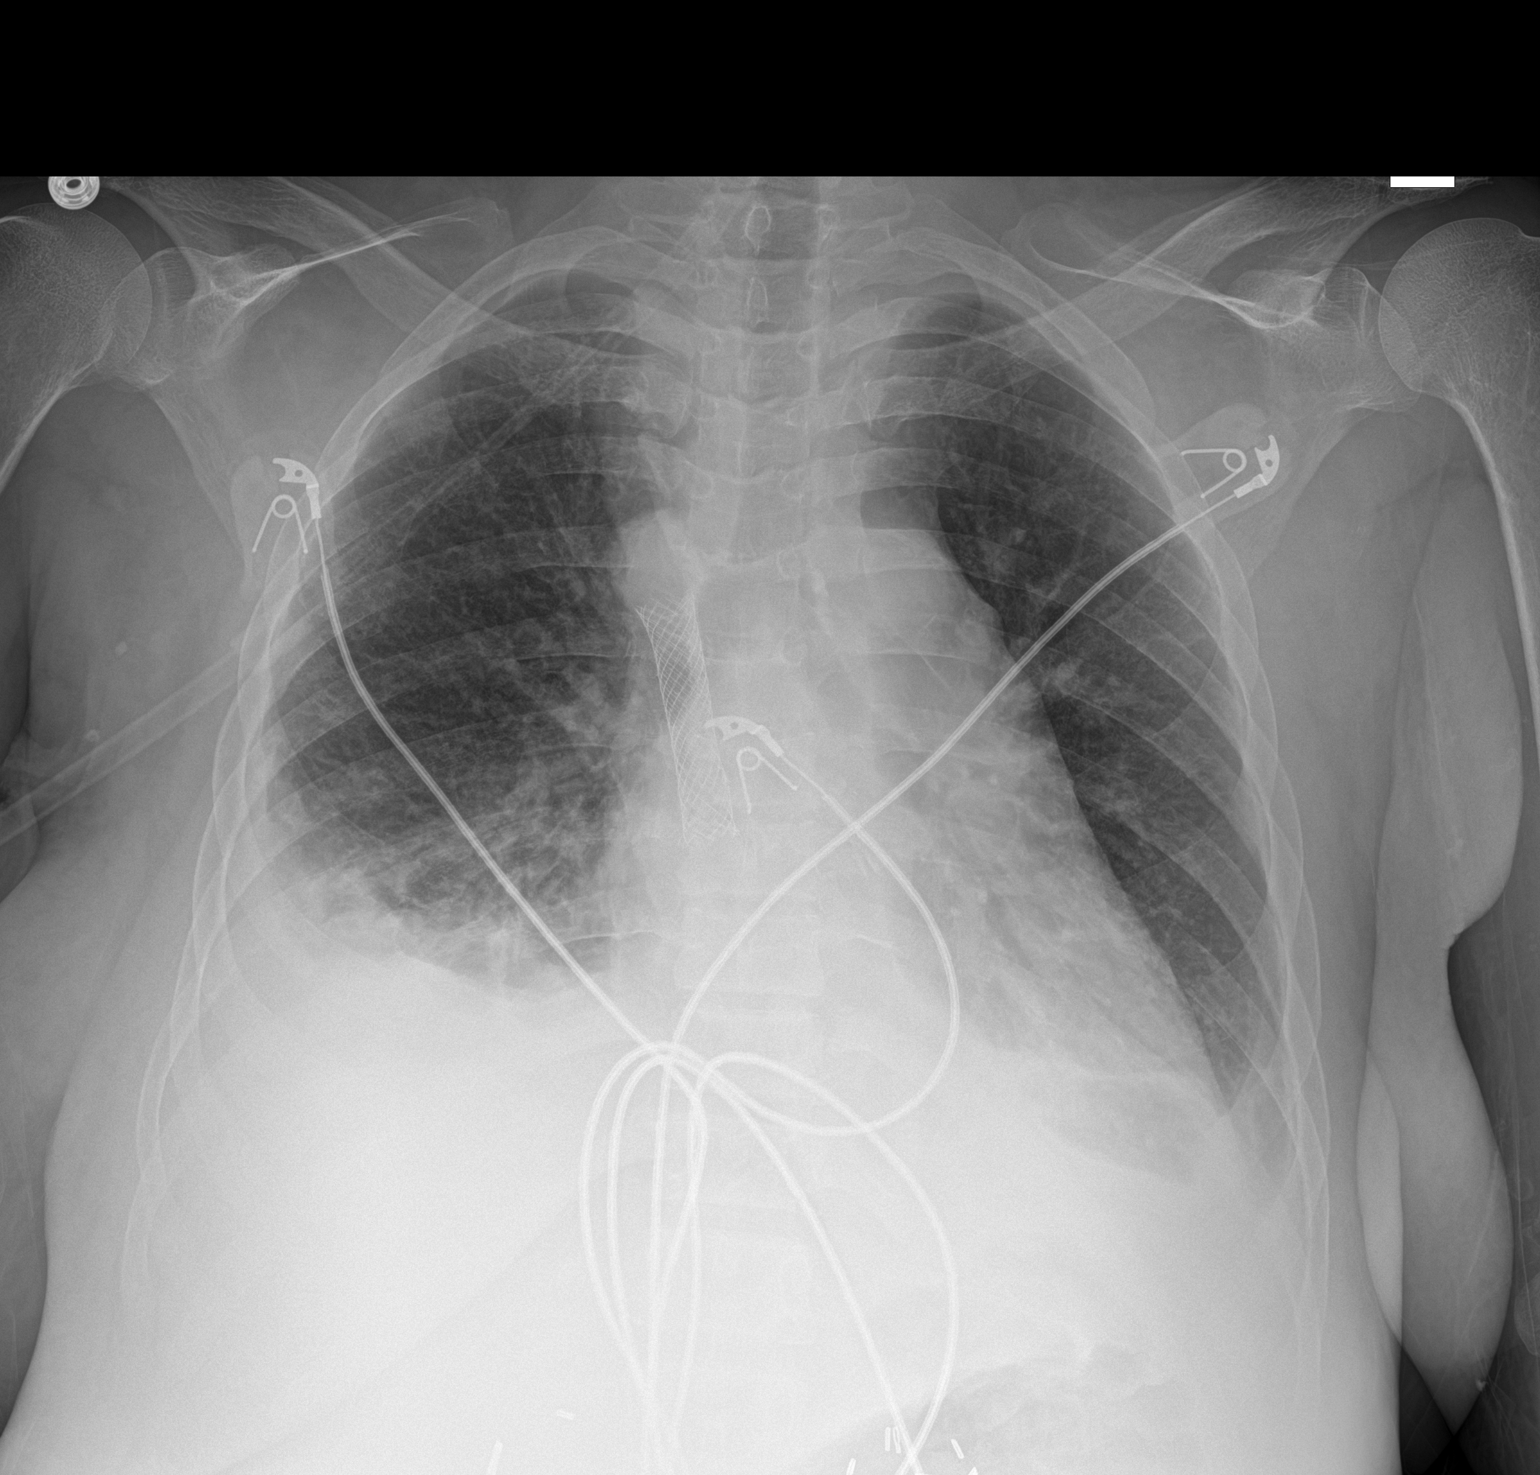

[1 of 1 positions shown; findings below may reference images not displayed]

FINDINGS: Cardiac shadow is stable. SVC stent is again seen. Lungs are well
aerated bilaterally. Small pleural effusions right greater than left
are noted. Mild bibasilar atelectatic changes are seen as well.
Postsurgical changes in the cervical spine are noted.
IMPRESSION: New small pleural effusions right greater than left with associated
atelectatic changes.

## 2021-08-04 ENCOUNTER — Encounter: Payer: Medicare Other | Admitting: Physical Therapy

## 2021-08-06 ENCOUNTER — Ambulatory Visit: Payer: Medicare Other | Attending: Internal Medicine

## 2021-08-06 DIAGNOSIS — M6281 Muscle weakness (generalized): Secondary | ICD-10-CM | POA: Insufficient documentation

## 2021-08-06 DIAGNOSIS — M25512 Pain in left shoulder: Secondary | ICD-10-CM | POA: Diagnosis not present

## 2021-08-06 DIAGNOSIS — G8929 Other chronic pain: Secondary | ICD-10-CM | POA: Insufficient documentation

## 2021-08-06 DIAGNOSIS — M25612 Stiffness of left shoulder, not elsewhere classified: Secondary | ICD-10-CM | POA: Diagnosis present

## 2021-08-06 DIAGNOSIS — M25611 Stiffness of right shoulder, not elsewhere classified: Secondary | ICD-10-CM | POA: Insufficient documentation

## 2021-08-06 DIAGNOSIS — M25511 Pain in right shoulder: Secondary | ICD-10-CM | POA: Insufficient documentation

## 2021-08-06 NOTE — Therapy (Signed)
OUTPATIENT PHYSICAL THERAPY TREATMENT NOTE   Patient Name: Tina Watkins MRN: [id number] DOB:Feb 08, 1965, 56 y.o., female Today's Date: 08/06/2021  PCP: Bo Merino I, NP REFERRING PROVIDER: Collier Salina, MD  END OF SESSION:   PT End of Session - 08/06/21 0723     Visit Number 3    Number of Visits 13    Date for PT Re-Evaluation 09/05/21    Authorization Type MEDICARE PART A AND B; MEDICAID [place] ACCESS    PT Start Time 0718    PT Stop Time 0800    PT Time Calculation (min) 42 min    Activity Tolerance Patient tolerated treatment well;Patient limited by pain    Behavior During Therapy [facility] for tasks assessed/performed              Past Medical History:  Diagnosis Date   Bacteremia due to Gram-negative bacteria 05/23/2011   Blind    right eye   Blind right eye    CHF (congestive heart failure) ([place])    Chronic lower back pain    Complication of anesthesia    "woke up during OR; I have an extremely high tolerance" (12/11/2011) 1 procedure was graft; the other procedures were procedures that are typically done with sedation.   DDD (degenerative disc disease), cervical    Depression    Dysrhythmia    "tachycardia" (12/11/2011) new onset afib 10/15/14 EKG   E coli bacteremia 06/18/2011   Elevated LDL cholesterol level 12/2018   ESRD (end stage renal disease) ([place]) 06/12/2011   Fibromyalgia    Gastroparesis    Gastroparesis    GERD (gastroesophageal reflux disease)    Glaucoma    right eye   Gout    H/O carpal tunnel syndrome    Headache 10/2019   Headache(784.0)    "not often anymore" (12/11/2011)   Herpes genitalia 1994   History of blood transfusion    "more than a few times" (12/11/2011)   History of stomach ulcers    Hypotension    Iron deficiency anemia    New onset a-fib ([place])    10/15/14 EKG   Osteopenia    Peripheral neuropathy 11/2018   Pressure ulcer of sacral region, stage 1 07/2019   Seizures ([place]) 1994   "post transplant; only  have had that one" (12/11/2011)   Spinal stenosis in cervical region    Stroke [facility])     left basal ganglia lacunar infarct; Right frontal lobe lacunar infarct.   Stroke [facility]) ~ 1999; 2001   "briefly lost my vision; lost my right eye" (12/11/2011)   Vitamin D deficiency 12/2018   Past Surgical History:  Procedure Laterality Date   ANTERIOR CERVICAL DECOMP/DISCECTOMY FUSION N/A 01/08/2015   Procedure: Anterior Cervical Three-Four/Four-Five Decompression/Diskectomy/Fusion;  Surgeon: Leeroy Cha, MD;  Location: [facility];  Service: Neurosurgery;  Laterality: N/A;  C3-4 C4-5 Anterior cervical decompression/diskectomy/fusion   APPENDECTOMY  ~ 2004   BIOPSY  09/12/2020   Procedure: BIOPSY;  Surgeon: Milus Banister, MD;  Location: [facility] ENDOSCOPY;  Service: Endoscopy;;   CATARACT EXTRACTION     right eye   COLONOSCOPY     COLONOSCOPY WITH PROPOFOL N/A 09/12/2020   Procedure: COLONOSCOPY WITH PROPOFOL;  Surgeon: Milus Banister, MD;  Location: [facility] ENDOSCOPY;  Service: Endoscopy;  Laterality: N/A;   ENUCLEATION  2001   "right"   ESOPHAGOGASTRODUODENOSCOPY (EGD) WITH PROPOFOL N/A 04/21/2012   Procedure: ESOPHAGOGASTRODUODENOSCOPY (EGD) WITH PROPOFOL;  Surgeon: Milus Banister, MD;  Location: [facility];  Service: Endoscopy;  Laterality: N/A;   ESOPHAGOGASTRODUODENOSCOPY (EGD) WITH PROPOFOL N/A 09/12/2020   Procedure: ESOPHAGOGASTRODUODENOSCOPY (EGD) WITH PROPOFOL;  Surgeon: Milus Banister, MD;  Location: [facility];  Service: Endoscopy;  Laterality: N/A;   INSERTION OF DIALYSIS CATHETER  1988   "AV graft LUA & LFA; LUA worked for 1 day; LFA never worked"   IR FLUORO GUIDE CV LINE RIGHT  07/11/2019   IR FLUORO GUIDE CV LINE RIGHT  10/20/2019   IR FLUORO GUIDE CV LINE RIGHT  05/31/2020   IR PTA VENOUS EXCEPT DIALYSIS CIRCUIT  05/31/2020   IR RADIOLOGY PERIPHERAL GUIDED IV START  07/11/2019   IR US GUIDE VASC ACCESS RIGHT  07/11/2019   IR US GUIDE VASC ACCESS RIGHT  07/11/2019   IR US GUIDE VASC  ACCESS RIGHT  07/11/2019   IR VENOCAVAGRAM IVC  05/31/2020   KIDNEY TRANSPLANT  1994; 1999; 2005   "right"   MULTIPLE TOOTH EXTRACTIONS     POLYPECTOMY  09/12/2020   Procedure: POLYPECTOMY;  Surgeon: Milus Banister, MD;  Location: [facility];  Service: Endoscopy;;   RIGHT HEART CATH N/A 03/23/2019   Procedure: RIGHT HEART CATH;  Surgeon: Jolaine Artist, MD;  Location: [place] CV LAB;  Service: Cardiovascular;  Laterality: N/A;   [place]?; 1994; 2005   Patient Active Problem List   Diagnosis Date Noted   Bilateral shoulder pain 06/06/2021   Bilateral hand swelling 06/06/2021   Cervical radiculitis 01/24/2021   Acute respiratory failure with hypoxia ([place]) 10/23/2020   Coagulation defect, unspecified ([place]) 08/01/2020   Aortic atherosclerosis ([place]) 07/10/2020   Secondary hypercoagulable state ([place]) 04/23/2020   Atrial flutter ([place]) 03/29/2020   Chronic pain of both knees 03/13/2020   Chills (without fever) 12/12/2019   Other pancytopenia ([place])    Decubitus ulcer of sacral region, stage 1 08/18/2019   HCAP (healthcare-associated pneumonia) 08/05/2019   Pressure injury of skin 07/24/2019   Chest congestion 07/24/2019   Itching 07/24/2019   Weakness 06/13/2019   Pneumonia due to COVID-19 virus 05/09/2019   Hypotension 05/09/2019   Symptomatic anemia 05/09/2019   CHF (congestive heart failure) ([place]) 05/09/2019   Swollen abdomen 01/04/2019   DDD (degenerative disc disease), cervical 12/06/2018   Neuropathy 12/06/2018   Paresthesia 10/11/2018   Neck pain 10/11/2018   Tobacco abuse 11/20/2017   Right leg swelling 11/20/2017   Right leg pain 11/20/2017   Stroke (cerebrum) ([place]) 11/20/2017   GERD (gastroesophageal reflux disease) 11/20/2017   Acute venous embolism and thrombosis of deep vessels of proximal lower extremity, right ([place]) 11/20/2017   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    Thrush, oral     AKI (acute kidney injury) ([place])    Multifocal pneumonia 08/09/2017   ETD (Eustachian tube dysfunction), bilateral 08/03/2017   Acute recurrent pansinusitis 07/21/2017   Chest pain 09/29/2015   Cervical stenosis of spinal canal 01/08/2015   Urinary tract infectious disease    Muscle spasms of neck 06/15/2014   Bleeding hemorrhoid 06/15/2014   Anemia in chronic kidney disease 06/15/2014   Pyelonephritis, acute 06/13/2014   Sepsis ([place]) 06/13/2014   History of kidney transplant    Abnormal CT scan 09/14/2012   Chronic pain syndrome 04/09/2012   Dehydration, mild 04/09/2012   Herpes infection 06/23/2011   Anxiety 06/23/2011   E coli bacteremia 06/18/2011   ESRD (end stage renal disease) ([place]) 06/12/2011   UTI (lower urinary  tract infection) 06/12/2011   Bacteremia due to Gram-negative bacteria 05/23/2011   History of renal transplantation 05/22/2011   Septic shock(785.52) 05/22/2011   Acute on chronic kidney failure ([place]) 05/22/2011   Gastroparesis 04/24/2008   WEIGHT LOSS 08/24/2007   NAUSEA AND VOMITING 08/24/2007    REFERRING DIAG: M25.511,G89.29,M25.512 (ICD-10-CM) - Chronic pain of both shoulders  THERAPY DIAG:  Chronic left shoulder pain  Chronic right shoulder pain  Stiffness of left shoulder, not elsewhere classified  Stiffness of right shoulder, not elsewhere classified  Muscle weakness (generalized)  Rationale for Evaluation and Treatment Rehabilitation  SUBJECTIVE:                                                                                                                                                                                       SUBJECTIVE STATEMENT: Pt reports her shoulders were bothering her more yesterday. Pt reports she has been doing her some of her HEP exs daily.   PERTINENT HISTORY DDD cervical, fibromyalgia, spinal stenosis cervical, CHF   PAIN:  Are you having pain? Yes: NPRS scale: 7/10 Pain location: Upper shoulders to [place] jt  to upper arms Pain description: ache and throb Aggravating factors: Just using my arms Relieving factors: Meds, pain meds Pain range: 5-9/10  PERTINENT HISTORY: DDD cervical, fibromyalgia, spinal stenosis cervical, CHF   PRECAUTIONS: None   PATIENT GOALS More strength and mobility of her  shoulders   DIAGNOSTIC FINDINGS:  X-ray right shoulder 4 views 06/20/21  Glenohumeral joint space appears normal.  Normal internal and external  rotation of humerus.  AC joint space appears normal.  No visible erosions  or abnormal calcifications seen.   Impression   Normal appearing shoulder x-ray   X-ray left shoulder 4 views 06/20/21  Glenohumeral joint space appears normal.  Normal internal and external  rotation of humerus.  AC joint space appears normal.  No visible erosions  or abnormal calcifications seen.   Impression   Normal appearing shoulder x-ray   PATIENT SURVEYS:  FOTO 58% predicted 64%   COGNITION:           Overall cognitive status: Within functional limits for tasks assessed                                  SENSATION: WFL   POSTURE: Forward head and rounded shoulders   UPPER EXTREMITY ROM:     AROM/AAROM/PROM Right eval Left eval Rt AROM 08/06/21 L AROM 08/06/21  Shoulder flexion 85/118/115 88/110/110 105 107  Shoulder extension        Shoulder abduction  Shoulder adduction        Shoulder internal rotation        Shoulder external rotation        Elbow flexion        Elbow extension        Wrist flexion        Wrist extension        Wrist ulnar deviation        Wrist radial deviation        Wrist pronation        Wrist supination        (Blank rows = not tested)   UPPER EXTREMITY MMT: Tested strength in a neutral position due to pain. Pt demonstrated strength against resistance seeming at least 3/5. No overt weakness noted. MMT Right eval Left eval  Shoulder flexion      Shoulder extension      Shoulder abduction      Shoulder  adduction      Shoulder internal rotation      Shoulder external rotation      Middle trapezius      Lower trapezius      Elbow flexion      Elbow extension      Wrist flexion      Wrist extension      Wrist ulnar deviation      Wrist radial deviation      Wrist pronation      Wrist supination      Grip strength (lbs)      (Blank rows = not tested)   SHOULDER SPECIAL TESTS:            Impingement tests: Hawkins/Kennedy impingement test: positive             Rotator cuff assessment: Empty can test: positive  and Full can test: positive             Both tests were painful            Biceps assessment: Yergason's test: positive    PALPATION:  TTP to the upper rap and peri-GH area             TODAY'S TREATMENT:  [facility] Adult PT Treatment:                                                DATE: 08/06/21 Therapeutic Exercise: Shoulder pulley flexion 2 mins Table top slides shoulder flexion x15 each Wall shoulder slides flexion x10 each Shoulder row 2x10 YTB Shoulder ext 2x10 YTB Shoulder Isometrics flex,abd, ER, ext 2x3 5"  [facility] Adult PT Treatment:                                                DATE: 07/25/21 Therapeutic Exercise: Nustep  6 mins L1 UE/LE Shoulder flexion on table top 2x10 Shoulder flexion wall slides x10 Shoulder row 2x10 YTB Shoulder ext 2x10 YTB  [facility] Adult PT Treatment: Eval                                               DATE:  07/18/21 Therapeutic Exercise: Standing Shoulder Flexion Wall Walk  5 reps 3 hold  Seated Shoulder Flexion Slide at Table Top with Forearm in Neutral 5 reps 3 hold   PATIENT EDUCATION: Education details: HEP, POC,  Person educated: Patient Education method: Explanation, Demonstration, Tactile cues, Verbal cues, and Handouts Education comprehension: verbalized understanding, returned demonstration, verbal cues required, and tactile cues required     HOME EXERCISE PROGRAM: Access Code: [email or web address] URL:  [email or web address] Date: 07/26/2021 Prepared by: Gar Ponto  Exercises - Standing Shoulder Flexion Wall Walk  - 2 x daily - 7 x weekly - 1 sets - 10 reps - 3 hold - Seated Shoulder Flexion Slide at Table Top with Forearm in Neutral  - 2 x daily - 7 x weekly - 1 sets - 10 reps - 3 hold - Standing Shoulder Row with Anchored Resistance  - 2 x daily - 7 x weekly - 1 sets - 10 reps - 3 hold - Shoulder extension with resistance - Neutral  - 2 x daily - 7 x weekly - 1 sets - 10 reps - 3 hold   ASSESSMENT:   CLINICAL IMPRESSION: PT was completed for bilat shoulder ROM and strengthening. Therex was completed in a manner to minimize pain. Pt reported a min increase in pain with today's session. Pt declined cold packs to her shoulders afterwards. AROM was reassess and found to be much improved vs the eval measures. A written HEP was not able to be provided due to technical issues, but pt demonstrated proper technique and voiced confidence in her understanding.   OBJECTIVE IMPAIRMENTS decreased activity tolerance, decreased ROM, decreased strength, increased fascial restrictions, increased muscle spasms, impaired UE functional use, postural dysfunction, and pain.    ACTIVITY LIMITATIONS carrying, lifting, bending, sleeping, bed mobility, bathing, toileting, dressing, reach over head, hygiene/grooming, and caring for others   PARTICIPATION LIMITATIONS: meal prep, cleaning, laundry, driving, shopping, community activity, and yard work   PERSONAL FACTORS Past/current experiences, Time since onset of injury/illness/exacerbation, and 3+ comorbidities:    DDD cervical, fibromyalgia, spinal stenosis cervical are also affecting patient's functional outcome.    REHAB POTENTIAL: Fair due to chronicity   CLINICAL DECISION MAKING: Evolving/moderate complexity   EVALUATION COMPLEXITY: Moderate     GOALS:     LONG TERM GOALS: Target date: 09/05/21    Pt's shoulder AROM will increase to R  105d and L 100d for improved shoulder function Baseline: see flow sheet Goal status: INITIAL   2.  Pt will demonstrate 4/5 shoulder strength for improved shoulder function Baseline: 3/5 Goal status: INITIAL   3.  Pt's FOTO score will improve to 64% as indication of improved function Baseline: 58% Goal status: INITIAL   4.  Pt will be Ind in a HEP to maintain achieved LOF Baseline:  Goal status: INITIAL     PLAN: PT FREQUENCY: 2x/week   PT DURATION: 6 weeks   PLANNED INTERVENTIONS: Therapeutic exercises, Therapeutic activity, Patient/Family education, Joint mobilization, Aquatic Therapy, Dry Needling, Cryotherapy, Moist heat, Taping, Ultrasound, Ionotophoresis '4mg'$ /ml Dexamethasone, Manual therapy, and Re-evaluation   PLAN FOR NEXT SESSION: Assess response to HEP; review FOTO; use of modalities, manual therapy and TPDN as inidcated    [place] Giammona MS, PT 08/06/21 8:04 AM

## 2021-08-10 NOTE — Therapy (Deleted)
OUTPATIENT PHYSICAL THERAPY TREATMENT NOTE   Patient Name: Tina Watkins MRN: [id number] DOB:16-Dec-1965, 56 y.o., female Today's Date: 08/10/2021  PCP: Bo Merino I, NP REFERRING PROVIDER: Collier Salina, MD  END OF SESSION:      Past Medical History:  Diagnosis Date   Bacteremia due to Gram-negative bacteria 05/23/2011   Blind    right eye   Blind right eye    CHF (congestive heart failure) ([place])    Chronic lower back pain    Complication of anesthesia    "woke up during OR; I have an extremely high tolerance" (12/11/2011) 1 procedure was graft; the other procedures were procedures that are typically done with sedation.   DDD (degenerative disc disease), cervical    Depression    Dysrhythmia    "tachycardia" (12/11/2011) new onset afib 10/15/14 EKG   E coli bacteremia 06/18/2011   Elevated LDL cholesterol level 12/2018   ESRD (end stage renal disease) ([place]) 06/12/2011   Fibromyalgia    Gastroparesis    Gastroparesis    GERD (gastroesophageal reflux disease)    Glaucoma    right eye   Gout    H/O carpal tunnel syndrome    Headache 10/2019   Headache(784.0)    "not often anymore" (12/11/2011)   Herpes genitalia 1994   History of blood transfusion    "more than a few times" (12/11/2011)   History of stomach ulcers    Hypotension    Iron deficiency anemia    New onset a-fib ([place])    10/15/14 EKG   Osteopenia    Peripheral neuropathy 11/2018   Pressure ulcer of sacral region, stage 1 07/2019   Seizures ([place]) 1994   "post transplant; only have had that one" (12/11/2011)   Spinal stenosis in cervical region    Stroke [facility])     left basal ganglia lacunar infarct; Right frontal lobe lacunar infarct.   Stroke [facility]) ~ 1999; 2001   "briefly lost my vision; lost my right eye" (12/11/2011)   Vitamin D deficiency 12/2018   Past Surgical History:  Procedure Laterality Date   ANTERIOR CERVICAL DECOMP/DISCECTOMY FUSION N/A 01/08/2015   Procedure: Anterior Cervical  Three-Four/Four-Five Decompression/Diskectomy/Fusion;  Surgeon: Leeroy Cha, MD;  Location: [place] NEURO ORS;  Service: Neurosurgery;  Laterality: N/A;  C3-4 C4-5 Anterior cervical decompression/diskectomy/fusion   APPENDECTOMY  ~ 2004   BIOPSY  09/12/2020   Procedure: BIOPSY;  Surgeon: Milus Banister, MD;  Location: [facility] END[facility]COPY;  Service: Endoscopy;;   CATARACT EXTRACTION     right eye   COLONOSCOPY     COLONOSCOPY WITH PROPOFOL N/A 09/12/2020   Procedure: COLONOSCOPY WITH PROPOFOL;  Surgeon: Milus Banister, MD;  Location: [facility]OS[facility];  Service: Endoscopy;  Laterality: N/A;   ENUCLEATION  2001   "right"   ESOPHAGOGASTRODUODENOSCOPY (EGD) WITH PROPOFOL N/A 04/21/2012   Procedure: ESOPHAGOGASTRODUODENOSCOPY (EGD) WITH PROPOFOL;  Surgeon: Milus Banister, MD;  Location: [facility];  Service: Endoscopy;  Laterality: N/A;   ESOPHAGOGASTRODUODENOSCOPY (EGD) WITH PROPOFOL N/A 09/12/2020   Procedure: ESOPHAGOGASTRODUODENOSCOPY (EGD) WITH PROPOFOL;  Surgeon: Milus Banister, MD;  Location: [facility];  Service: Endoscopy;  Laterality: N/A;   INSERTION OF DIALYSIS CATHETER  1988   "AV graft LUA & LFA; LUA worked for 1 day; LFA never worked"   IR FLUORO GUIDE CV LINE RIGHT  07/11/2019   IR FLUORO GUIDE CV LINE RIGHT  10/20/2019   IR FLUORO GUIDE CV LINE RIGHT  05/31/2020   IR PTA VENOUS EXCEPT DIALYSIS CIRCUIT  05/31/2020   IR RADIOLOGY PERIPHERAL GUIDED IV START  07/11/2019   IR US GUIDE VASC ACCESS RIGHT  07/11/2019   IR US GUIDE VASC ACCESS RIGHT  07/11/2019   IR US GUIDE VASC ACCESS RIGHT  07/11/2019   IR VENOCAVAGRAM IVC  05/31/2020   KIDNEY TRANSPLANT  1994; 1999; 2005   "right"   MULTIPLE TOOTH EXTRACTIONS     POLYPECTOMY  09/12/2020   Procedure: POLYPECTOMY;  Surgeon: Milus Banister, MD;  Location: [facility];  Service: Endoscopy;;   RIGHT HEART CATH N/A 03/23/2019   Procedure: RIGHT HEART CATH;  Surgeon: Jolaine Artist, MD;  Location: [place] CV LAB;  Service: Cardiovascular;   Laterality: N/A;   [place]?; 1994; 2005   Patient Active Problem List   Diagnosis Date Noted   Bilateral shoulder pain 06/06/2021   Bilateral hand swelling 06/06/2021   Cervical radiculitis 01/24/2021   Acute respiratory failure with hypoxia ([place]) 10/23/2020   Coagulation defect, unspecified ([place]) 08/01/2020   Aortic atherosclerosis ([place]) 07/10/2020   Secondary hypercoagulable state ([place]) 04/23/2020   Atrial flutter ([place]) 03/29/2020   Chronic pain of both knees 03/13/2020   Chills (without fever) 12/12/2019   Other pancytopenia ([place])    Decubitus ulcer of sacral region, stage 1 08/18/2019   HCAP (healthcare-associated pneumonia) 08/05/2019   Pressure injury of skin 07/24/2019   Chest congestion 07/24/2019   Itching 07/24/2019   Weakness 06/13/2019   Pneumonia due to COVID-19 virus 05/09/2019   Hypotension 05/09/2019   Symptomatic anemia 05/09/2019   CHF (congestive heart failure) ([place]) 05/09/2019   Swollen abdomen 01/04/2019   DDD (degenerative disc disease), cervical 12/06/2018   Neuropathy 12/06/2018   Paresthesia 10/11/2018   Neck pain 10/11/2018   Tobacco abuse 11/20/2017   Right leg swelling 11/20/2017   Right leg pain 11/20/2017   Stroke (cerebrum) ([place]) 11/20/2017   GERD (gastroesophageal reflux disease) 11/20/2017   Acute venous embolism and thrombosis of deep vessels of proximal lower extremity, right ([place]) 11/20/2017   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    Thrush, oral    AKI (acute kidney injury) ([place])    Multifocal pneumonia 08/09/2017   ETD (Eustachian tube dysfunction), bilateral 08/03/2017   Acute recurrent pansinusitis 07/21/2017   Chest pain 09/29/2015   Cervical stenosis of spinal canal 01/08/2015   Urinary tract infectious disease    Muscle spasms of neck 06/15/2014   Bleeding hemorrhoid 06/15/2014   Anemia in chronic kidney disease 06/15/2014   Pyelonephritis, acute  06/13/2014   Sepsis ([place]) 06/13/2014   History of kidney transplant    Abnormal CT scan 09/14/2012   Chronic pain syndrome 04/09/2012   Dehydration, mild 04/09/2012   Herpes infection 06/23/2011   Anxiety 06/23/2011   E coli bacteremia 06/18/2011   ESRD (end stage renal disease) ([place]) 06/12/2011   UTI (lower urinary tract infection) 06/12/2011   Bacteremia due to Gram-negative bacteria 05/23/2011   History of renal transplantation 05/22/2011   Septic shock(785.52) 05/22/2011   Acute on chronic kidney failure ([place]) 05/22/2011   Gastroparesis 04/24/2008   WEIGHT LOSS 08/24/2007   NAUSEA AND VOMITING 08/24/2007    REFERRING DIAG: M25.511,G89.29,M25.512 (ICD-10-CM) - Chronic pain of both shoulders  THERAPY DIAG:  No diagnosis found.  Rationale for Evaluation and Treatment Rehabilitation  SUBJECTIVE:  SUBJECTIVE STATEMENT: ***  Pt reports her shoulders were bothering her more yesterday. Pt reports she has been doing her some of her HEP exs daily.   PERTINENT HISTORY DDD cervical, fibromyalgia, spinal stenosis cervical, CHF   PAIN:  Are you having pain? Yes: NPRS scale: 7/10 Pain location: Upper shoulders to [place] jt to upper arms Pain description: ache and throb Aggravating factors: Just using my arms Relieving factors: Meds, pain meds Pain range: 5-9/10  PERTINENT HISTORY: DDD cervical, fibromyalgia, spinal stenosis cervical, CHF   PRECAUTIONS: None   PATIENT GOALS More strength and mobility of her  shoulders   DIAGNOSTIC FINDINGS:  X-ray right shoulder 4 views 06/20/21  Glenohumeral joint space appears normal.  Normal internal and external  rotation of humerus.  AC joint space appears normal.  No visible erosions  or abnormal calcifications seen.   Impression   Normal appearing  shoulder x-ray   X-ray left shoulder 4 views 06/20/21  Glenohumeral joint space appears normal.  Normal internal and external  rotation of humerus.  AC joint space appears normal.  No visible erosions  or abnormal calcifications seen.   Impression   Normal appearing shoulder x-ray   PATIENT SURVEYS:  FOTO 58% predicted 64%   COGNITION:           Overall cognitive status: Within functional limits for tasks assessed                                  SENSATION: WFL   POSTURE: Forward head and rounded shoulders   UPPER EXTREMITY ROM:     AROM/AAROM/PROM Right eval Left eval Rt AROM 08/06/21 L AROM 08/06/21  Shoulder flexion 85/118/115 88/110/110 105 107  Shoulder extension        Shoulder abduction        Shoulder adduction        Shoulder internal rotation        Shoulder external rotation        Elbow flexion        Elbow extension        Wrist flexion        Wrist extension        Wrist ulnar deviation        Wrist radial deviation        Wrist pronation        Wrist supination        (Blank rows = not tested)   UPPER EXTREMITY MMT: Tested strength in a neutral position due to pain. Pt demonstrated strength against resistance seeming at least 3/5. No overt weakness noted. MMT Right eval Left eval  Shoulder flexion      Shoulder extension      Shoulder abduction      Shoulder adduction      Shoulder internal rotation      Shoulder external rotation      Middle trapezius      Lower trapezius      Elbow flexion      Elbow extension      Wrist flexion      Wrist extension      Wrist ulnar deviation      Wrist radial deviation      Wrist pronation      Wrist supination      Grip strength (lbs)      (Blank rows = not tested)   SHOULDER SPECIAL TESTS:  Impingement tests: Hawkins/Kennedy impingement test: positive             Rotator cuff assessment: Empty can test: positive  and Full can test: positive             Both tests were painful             Biceps assessment: Yergason's test: positive    PALPATION:  TTP to the upper rap and peri-GH area             TODAY'S TREATMENT:   [facility] PT Treatment:                                                DATE: 08/11/21 Therapeutic Exercise: *** Manual Therapy: *** Neuromuscular re-ed: *** Therapeutic Activity: *** Modalities: *** Self Care: ***  [facility] Adult PT Treatment:                                                DATE: 08/06/21 Therapeutic Exercise: Shoulder pulley flexion 2 mins Table top slides shoulder flexion x15 each Wall shoulder slides flexion x10 each Shoulder row 2x10 YTB Shoulder ext 2x10 YTB Shoulder Isometrics flex,abd, ER, ext 2x3 5"  [facility] Adult PT Treatment:                                                DATE: 07/25/21 Therapeutic Exercise: Nustep  6 mins L1 UE/LE Shoulder flexion on table top 2x10 Shoulder flexion wall slides x10 Shoulder row 2x10 YTB Shoulder ext 2x10 YTB  [facility] Adult PT Treatment: Eval                                               DATE: 07/18/21 Therapeutic Exercise: Standing Shoulder Flexion Wall Walk  5 reps 3 hold  Seated Shoulder Flexion Slide at Table Top with Forearm in Neutral 5 reps 3 hold   PATIENT EDUCATION: Education details: HEP, POC,  Person educated: Patient Education method: Explanation, Demonstration, Tactile cues, Verbal cues, and Handouts Education comprehension: verbalized understanding, returned demonstration, verbal cues required, and tactile cues required     HOME EXERCISE PROGRAM: Access Code: [email or web address] URL: [email or web address] Date: 07/26/2021 Prepared by: Gar Ponto  Exercises - Standing Shoulder Flexion Wall Walk  - 2 x daily - 7 x weekly - 1 sets - 10 reps - 3 hold - Seated Shoulder Flexion Slide at Table Top with Forearm in Neutral  - 2 x daily - 7 x weekly - 1 sets - 10 reps - 3 hold - Standing Shoulder Row with Anchored Resistance  - 2 x daily - 7 x weekly - 1 sets - 10 reps - 3  hold - Shoulder extension with resistance - Neutral  - 2 x daily - 7 x weekly - 1 sets - 10 reps - 3 hold   ASSESSMENT:   CLINICAL IMPRESSION: PT was completed for bilat shoulder ROM and strengthening. Therex was completed in a manner to minimize pain.  Pt reported a min increase in pain with today's session. Pt declined cold packs to her shoulders afterwards. AROM was reassess and found to be much improved vs the eval measures. A written HEP was not able to be provided due to technical issues, but pt demonstrated proper technique and voiced confidence in her understanding.   OBJECTIVE IMPAIRMENTS decreased activity tolerance, decreased ROM, decreased strength, increased fascial restrictions, increased muscle spasms, impaired UE functional use, postural dysfunction, and pain.    ACTIVITY LIMITATIONS carrying, lifting, bending, sleeping, bed mobility, bathing, toileting, dressing, reach over head, hygiene/grooming, and caring for others   PARTICIPATION LIMITATIONS: meal prep, cleaning, laundry, driving, shopping, community activity, and yard work   PERSONAL FACTORS Past/current experiences, Time since onset of injury/illness/exacerbation, and 3+ comorbidities:    DDD cervical, fibromyalgia, spinal stenosis cervical are also affecting patient's functional outcome.    REHAB POTENTIAL: Fair due to chronicity   CLINICAL DECISION MAKING: Evolving/moderate complexity   EVALUATION COMPLEXITY: Moderate     GOALS:     LONG TERM GOALS: Target date: 09/05/21    Pt's shoulder AROM will increase to R 105d and L 100d for improved shoulder function Baseline: see flow sheet Goal status: INITIAL   2.  Pt will demonstrate 4/5 shoulder strength for improved shoulder function Baseline: 3/5 Goal status: INITIAL   3.  Pt's FOTO score will improve to 64% as indication of improved function Baseline: 58% Goal status: INITIAL   4.  Pt will be Ind in a HEP to maintain achieved LOF Baseline:  Goal status:  INITIAL     PLAN: PT FREQUENCY: 2x/week   PT DURATION: 6 weeks   PLANNED INTERVENTIONS: Therapeutic exercises, Therapeutic activity, Patient/Family education, Joint mobilization, Aquatic Therapy, Dry Needling, Cryotherapy, Moist heat, Taping, Ultrasound, Ionotophoresis '4mg'$ /ml Dexamethasone, Manual therapy, and Re-evaluation   PLAN FOR NEXT SESSION: Assess response to HEP; review FOTO; use of modalities, manual therapy and TPDN as inidcated    Allen Ralls MS, PT 08/10/21 7:59 PM

## 2021-08-11 ENCOUNTER — Ambulatory Visit: Payer: Medicare Other | Admitting: Physical Therapy

## 2021-08-18 ENCOUNTER — Ambulatory Visit: Payer: Medicare Other | Admitting: Physical Therapy

## 2021-08-18 ENCOUNTER — Encounter: Payer: Self-pay | Admitting: Physical Therapy

## 2021-08-18 DIAGNOSIS — M25611 Stiffness of right shoulder, not elsewhere classified: Secondary | ICD-10-CM

## 2021-08-18 DIAGNOSIS — M6281 Muscle weakness (generalized): Secondary | ICD-10-CM

## 2021-08-18 DIAGNOSIS — M25612 Stiffness of left shoulder, not elsewhere classified: Secondary | ICD-10-CM

## 2021-08-18 DIAGNOSIS — G8929 Other chronic pain: Secondary | ICD-10-CM

## 2021-08-18 DIAGNOSIS — M25512 Pain in left shoulder: Secondary | ICD-10-CM | POA: Diagnosis not present

## 2021-08-18 NOTE — Therapy (Signed)
OUTPATIENT PHYSICAL THERAPY TREATMENT NOTE   Patient Name: Tina Watkins MRN: [id number] DOB:10-14-1965, 56 y.o., female Today's Date: 08/18/2021  PCP: Bo Merino I, NP REFERRING PROVIDER: Collier Salina, MD  END OF SESSION:   PT End of Session - 08/18/21 1107     Visit Number 4    Number of Visits 13    Date for PT Re-Evaluation 09/05/21    Authorization Type MEDICARE PART A AND B; MEDICAID [place] ACCESS    PT Start Time 1109    PT Stop Time 1200    PT Time Calculation (min) 51 min    Activity Tolerance Patient tolerated treatment well;Patient limited by pain    Behavior During Therapy [facility] for tasks assessed/performed               Past Medical History:  Diagnosis Date   Bacteremia due to Gram-negative bacteria 05/23/2011   Blind    right eye   Blind right eye    CHF (congestive heart failure) ([place])    Chronic lower back pain    Complication of anesthesia    "woke up during OR; I have an extremely high tolerance" (12/11/2011) 1 procedure was graft; the other procedures were procedures that are typically done with sedation.   DDD (degenerative disc disease), cervical    Depression    Dysrhythmia    "tachycardia" (12/11/2011) new onset afib 10/15/14 EKG   E coli bacteremia 06/18/2011   Elevated LDL cholesterol level 12/2018   ESRD (end stage renal disease) ([place]) 06/12/2011   Fibromyalgia    Gastroparesis    Gastroparesis    GERD (gastroesophageal reflux disease)    Glaucoma    right eye   Gout    H/O carpal tunnel syndrome    Headache 10/2019   Headache(784.0)    "not often anymore" (12/11/2011)   Herpes genitalia 1994   History of blood transfusion    "more than a few times" (12/11/2011)   History of stomach ulcers    Hypotension    Iron deficiency anemia    New onset a-fib ([place])    10/15/14 EKG   Osteopenia    Peripheral neuropathy 11/2018   Pressure ulcer of sacral region, stage 1 07/2019   Seizures ([place]) 1994   "post transplant; only  have had that one" (12/11/2011)   Spinal stenosis in cervical region    Stroke [facility])     left basal ganglia lacunar infarct; Right frontal lobe lacunar infarct.   Stroke [facility]) ~ 1999; 2001   "briefly lost my vision; lost my right eye" (12/11/2011)   Vitamin D deficiency 12/2018   Past Surgical History:  Procedure Laterality Date   ANTERIOR CERVICAL DECOMP/DISCECTOMY FUSION N/A 01/08/2015   Procedure: Anterior Cervical Three-Four/Four-Five Decompression/Diskectomy/Fusion;  Surgeon: Leeroy Cha, MD;  Location: [place] NEURO ORS;  Service: Neurosurgery;  Laterality: N/A;  C3-4 C4-5 Anterior cervical decompression/diskectomy/fusion   APPENDECTOMY  ~ 2004   BIOPSY  09/12/2020   Procedure: BIOPSY;  Surgeon: Milus Banister, MD;  Location: [facility] ENDOSCOPY;  Service: Endoscopy;;   CATARACT EXTRACTION     right eye   COLONOSCOPY     COLONOSCOPY WITH PROPOFOL N/A 09/12/2020   Procedure: COLONOSCOPY WITH PROPOFOL;  Surgeon: Milus Banister, MD;  Location: [facility] ENDOSCOPY;  Service: Endoscopy;  Laterality: N/A;   ENUCLEATION  2001   "right"   ESOPHAGOGASTRODUODENOSCOPY (EGD) WITH PROPOFOL N/A 04/21/2012   Procedure: ESOPHAGOGASTRODUODENOSCOPY (EGD) WITH PROPOFOL;  Surgeon: Milus Banister, MD;  Location: [facility] ENDOSCOPY;  Service: Endoscopy;  Laterality: N/A;   ESOPHAGOGASTRODUODENOSCOPY (EGD) WITH PROPOFOL N/A 09/12/2020   Procedure: ESOPHAGOGASTRODUODENOSCOPY (EGD) WITH PROPOFOL;  Surgeon: Milus Banister, MD;  Location: [facility] ENDOS[facility];  Service: Endoscopy;  Laterality: N/A;   INSERTION OF DIALYSIS CATHETER  1988   "AV graft LUA & LFA; LUA worked for 1 day; LFA never worked"   IR FLUORO GUIDE CV LINE RIGHT  07/11/2019   IR FLUORO GUIDE CV LINE RIGHT  10/20/2019   IR FLUORO GUIDE CV LINE RIGHT  05/31/2020   IR PTA VENOUS EXCEPT DIALYSIS CIRCUIT  05/31/2020   IR RADIOLOGY PERIPHERAL GUIDED IV START  07/11/2019   IR US GUIDE VASC ACCESS RIGHT  07/11/2019   IR US GUIDE VASC ACCESS RIGHT  07/11/2019   IR US GUIDE VASC  ACCESS RIGHT  07/11/2019   IR VENOCAVAGRAM IVC  05/31/2020   KIDNEY TRANSPLANT  1994; 1999; 2005   "right"   MULTIPLE TOOTH EXTRACTIONS     POLYPECTOMY  09/12/2020   Procedure: POLYPECTOMY;  Surgeon: Milus Banister, MD;  Location: [facility] ENDOS[facility];  Service: Endoscopy;;   RIGHT HEART CATH N/A 03/23/2019   Procedure: RIGHT HEART CATH;  Surgeon: Jolaine Artist, MD;  Location: [place] CV LAB;  Service: Cardiovascular;  Laterality: N/A;   [place]?; 1994; 2005   Patient Active Problem List   Diagnosis Date Noted   Bilateral shoulder pain 06/06/2021   Bilateral hand swelling 06/06/2021   Cervical radiculitis 01/24/2021   Acute respiratory failure with hypoxia ([place]) 10/23/2020   Coagulation defect, unspecified ([place]) 08/01/2020   Aortic atherosclerosis ([place]) 07/10/2020   Secondary hypercoagulable state ([place]) 04/23/2020   Atrial flutter ([place]) 03/29/2020   Chronic pain of both knees 03/13/2020   Chills (without fever) 12/12/2019   Other pancytopenia ([place])    Decubitus ulcer of sacral region, stage 1 08/18/2019   HCAP (healthcare-associated pneumonia) 08/05/2019   Pressure injury of skin 07/24/2019   Chest congestion 07/24/2019   Itching 07/24/2019   Weakness 06/13/2019   Pneumonia due to COVID-19 virus 05/09/2019   Hypotension 05/09/2019   Symptomatic anemia 05/09/2019   CHF (congestive heart failure) ([place]) 05/09/2019   Swollen abdomen 01/04/2019   DDD (degenerative disc disease), cervical 12/06/2018   Neuropathy 12/06/2018   Paresthesia 10/11/2018   Neck pain 10/11/2018   Tobacco abuse 11/20/2017   Right leg swelling 11/20/2017   Right leg pain 11/20/2017   Stroke (cerebrum) ([place]) 11/20/2017   GERD (gastroesophageal reflux disease) 11/20/2017   Acute venous embolism and thrombosis of deep vessels of proximal lower extremity, right ([place]) 11/20/2017   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    Thrush, oral     AKI (acute kidney injury) ([place])    Multifocal pneumonia 08/09/2017   ETD (Eustachian tube dysfunction), bilateral 08/03/2017   Acute recurrent pansinusitis 07/21/2017   Chest pain 09/29/2015   Cervical stenosis of spinal canal 01/08/2015   Urinary tract infectious disease    Muscle spasms of neck 06/15/2014   Bleeding hemorrhoid 06/15/2014   Anemia in chronic kidney disease 06/15/2014   Pyelonephritis, acute 06/13/2014   Sepsis ([place]) 06/13/2014   History of kidney transplant    Abnormal CT scan 09/14/2012   Chronic pain syndrome 04/09/2012   Dehydration, mild 04/09/2012   Herpes infection 06/23/2011   Anxiety 06/23/2011   E coli bacteremia 06/18/2011   ESRD (end stage renal disease) ([place]) 06/12/2011   UTI (lower urinary  tract infection) 06/12/2011   Bacteremia due to Gram-negative bacteria 05/23/2011   History of renal transplantation 05/22/2011   Septic shock(785.52) 05/22/2011   Acute on chronic kidney failure ([place]) 05/22/2011   Gastroparesis 04/24/2008   WEIGHT LOSS 08/24/2007   NAUSEA AND VOMITING 08/24/2007    REFERRING DIAG: M25.511,G89.29,M25.512 (ICD-10-CM) - Chronic pain of both shoulders  THERAPY DIAG:  Chronic left shoulder pain  Chronic right shoulder pain  Stiffness of left shoulder, not elsewhere classified  Stiffness of right shoulder, not elsewhere classified  Muscle weakness (generalized)  Rationale for Evaluation and Treatment Rehabilitation  SUBJECTIVE:                                                                                                                                                                                       SUBJECTIVE STATEMENT: I was out for a week or so, gastro issues.  I think the shots are wearing off.      PERTINENT HISTORY DDD cervical, fibromyalgia, spinal stenosis cervical, CHF   PAIN:  Are you having pain? Yes: NPRS scale: 8/10 Pain location: Upper shoulders to [place] jt to upper arms Pain description: ache  and throb Aggravating factors: Just using my arms Relieving factors: Meds, pain meds Pain range: 5-9/10  PERTINENT HISTORY: DDD cervical, fibromyalgia, spinal stenosis cervical, CHF   PRECAUTIONS: None   PATIENT GOALS More strength and mobility of her  shoulders   DIAGNOSTIC FINDINGS:  X-ray right shoulder 4 views 06/20/21  Glenohumeral joint space appears normal.  Normal internal and external  rotation of humerus.  AC joint space appears normal.  No visible erosions  or abnormal calcifications seen.   Impression   Normal appearing shoulder x-ray   X-ray left shoulder 4 views 06/20/21  Glenohumeral joint space appears normal.  Normal internal and external  rotation of humerus.  AC joint space appears normal.  No visible erosions  or abnormal calcifications seen.   Impression   Normal appearing shoulder x-ray   PATIENT SURVEYS:  FOTO 58% predicted 64%   COGNITION:           Overall cognitive status: Within functional limits for tasks assessed                                  SENSATION: WFL   POSTURE: Forward head and rounded shoulders   UPPER EXTREMITY ROM:     AROM/AAROM/PROM Right eval Left eval Rt AROM 08/06/21 L AROM 08/06/21  Shoulder flexion 85/118/115 88/110/110 105 107  Shoulder extension        Shoulder abduction  Shoulder adduction        Shoulder internal rotation        Shoulder external rotation        Elbow flexion        Elbow extension        Wrist flexion        Wrist extension        Wrist ulnar deviation        Wrist radial deviation        Wrist pronation        Wrist supination        (Blank rows = not tested)   UPPER EXTREMITY MMT: Tested strength in a neutral position due to pain. Pt demonstrated strength against resistance seeming at least 3/5. No overt weakness noted. MMT Right 7/17 Left 7/17  Shoulder flexion  2+  3-  Shoulder extension      Shoulder abduction      Shoulder adduction      Shoulder internal rotation   4-  4  Shoulder external rotation  3  3+  Middle trapezius      Lower trapezius      Elbow flexion      Elbow extension      Wrist flexion      Wrist extension      Wrist ulnar deviation      Wrist radial deviation      Wrist pronation      Wrist supination      Grip strength (lbs)      (Blank rows = not tested)   SHOULDER SPECIAL TESTS:            Impingement tests: Hawkins/Kennedy impingement test: positive             Rotator cuff assessment: Empty can test: positive  and Full can test: positive             Both tests were painful            Biceps assessment: Yergason's test: positive    PALPATION:  TTP to the upper rap and peri-GH area             TODAY'S TREATMENT:   [facility] Adult PT Treatment:                                                DATE: 08/18/21 Therapeutic Exercise: Supine chest press AAROM shoulder flexion  x 10, slow and painful  ER/IR with dowel unable to tolerate AAROM  Table slides for bilateral UE flexion x 10, scaption  x 10  ER /IR open chain in neutral x 10 Then added YTB x 10 with ER/IR, min ROM  Head rolls , shoulder rolls for reset between sets  Standing ball rolls on high mat table x 10  Ball press bilateral shoulders x 10 isometric extension   Manual Therapy: PROM all planes bilateral, light distraction in various angles   10 min bilateral shoulders MHP    [facility] Adult PT Treatment:                                                DATE: 08/06/21 Therapeutic Exercise: Shoulder pulley flexion 2 mins Table top slides  shoulder flexion x15 each Wall shoulder slides flexion x10 each Shoulder row 2x10 YTB Shoulder ext 2x10 YTB Shoulder Isometrics flex,abd, ER, ext 2x3 5"  [facility] Adult PT Treatment:                                                DATE: 07/25/21 Therapeutic Exercise: Nustep  6 mins L1 UE/LE Shoulder flexion on table top 2x10 Shoulder flexion wall slides x10 Shoulder row 2x10 YTB Shoulder ext 2x10 YTB  [facility] Adult PT Treatment: Eval                                                DATE: 07/18/21 Therapeutic Exercise: Standing Shoulder Flexion Wall Walk  5 reps 3 hold  Seated Shoulder Flexion Slide at Table Top with Forearm in Neutral 5 reps 3 hold   PATIENT EDUCATION: Education details: HEP, POC,  Person educated: Patient Education method: Explanation, Demonstration, Tactile cues, Verbal cues, and Handouts Education comprehension: verbalized understanding, returned demonstration, verbal cues required, and tactile cues required     HOME EXERCISE PROGRAM: Access Code: [email or web address]   ASSESSMENT:   CLINICAL IMPRESSION: Patient with significant pain today and limitations in AROM all planes.  She has barely 90 deg in shoulder flexion and scaption, about 40-50 deg ER and IR bilaterally. She has more pain in LUE than Rt. Passively limited in all planes by pain and capsular end feel.  She wants to cont PT until she sees her MD 8/25   OBJECTIVE IMPAIRMENTS decreased activity tolerance, decreased ROM, decreased strength, increased fascial restrictions, increased muscle spasms, impaired UE functional use, postural dysfunction, and pain.    ACTIVITY LIMITATIONS carrying, lifting, bending, sleeping, bed mobility, bathing, toileting, dressing, reach over head, hygiene/grooming, and caring for others   PARTICIPATION LIMITATIONS: meal prep, cleaning, laundry, driving, shopping, community activity, and yard work   PERSONAL FACTORS Past/current experiences, Time since onset of injury/illness/exacerbation, and 3+ comorbidities:    DDD cervical, fibromyalgia, spinal stenosis cervical are also affecting patient's functional outcome.    REHAB POTENTIAL: Fair due to chronicity   CLINICAL DECISION MAKING: Evolving/moderate complexity   EVALUATION COMPLEXITY: Moderate     GOALS:     LONG TERM GOALS: Target date: 09/05/21    Pt's shoulder AROM will increase to R 105d and L 100d for improved shoulder function Baseline: about 90 deg  today Goal status: ongoing    2.  Pt will demonstrate 4/5 shoulder strength for improved shoulder function Baseline: 3/5  Goal status: ongoing    3.  Pt's FOTO score will improve to 64% as indication of improved function Baseline: 58% Goal status:ongoing    4.  Pt will be Ind in a HEP to maintain achieved LOF Baseline:  Goal status: ongoing    PLAN: PT FREQUENCY: 2x/week   PT DURATION: 6 weeks   PLANNED INTERVENTIONS: Therapeutic exercises, Therapeutic activity, Patient/Family education, Joint mobilization, Aquatic Therapy, Dry Needling, Cryotherapy, Moist heat, Taping, Ultrasound, Ionotophoresis '4mg'$ /ml Dexamethasone, Manual therapy, and Re-evaluation   PLAN FOR NEXT SESSION:  ROM, as tolerated. Pulleys, table slides . use of modalities, manual therapy and TPDN as inidcated  Raeford Razor, PT 08/18/21 11:50 AM Phone: [phone] Fax: [phone]

## 2021-08-20 ENCOUNTER — Ambulatory Visit: Payer: Medicare Other

## 2021-08-20 DIAGNOSIS — M25512 Pain in left shoulder: Secondary | ICD-10-CM | POA: Diagnosis not present

## 2021-08-20 DIAGNOSIS — M6281 Muscle weakness (generalized): Secondary | ICD-10-CM

## 2021-08-20 DIAGNOSIS — G8929 Other chronic pain: Secondary | ICD-10-CM

## 2021-08-20 DIAGNOSIS — M25611 Stiffness of right shoulder, not elsewhere classified: Secondary | ICD-10-CM

## 2021-08-20 DIAGNOSIS — M25612 Stiffness of left shoulder, not elsewhere classified: Secondary | ICD-10-CM

## 2021-08-20 NOTE — Therapy (Signed)
OUTPATIENT PHYSICAL THERAPY TREATMENT NOTE   Patient Name: Tina Watkins MRN: [id number] DOB:[birth year], 56 y.o., female Today's Date: 08/20/2021  PCP: Bo Merino I, NP REFERRING PROVIDER: Collier Salina, MD  END OF SESSION:   PT End of Session - 08/20/21 1148     Visit Number 5    Number of Visits 13    Date for PT Re-Evaluation 09/05/21    Authorization Type MEDICARE PART A AND B; MEDICAID  ACCESS    PT Start Time 1148    PT Stop Time 1238    PT Time Calculation (min) 50 min    Activity Tolerance Patient tolerated treatment well;Patient limited by pain    Behavior During Therapy [facility] for tasks assessed/performed                Past Medical History:  Diagnosis Date   Bacteremia due to Gram-negative bacteria 05/23/2011   Blind    right eye   Blind right eye    CHF (congestive heart failure) ([place])    Chronic lower back pain    Complication of anesthesia    "woke up during OR; I have an extremely high tolerance" (12/11/2011) 1 procedure was graft; the other procedures were procedures that are typically done with sedation.   DDD (degenerative disc disease), cervical    Depression    Dysrhythmia    "tachycardia" (12/11/2011) new onset afib 10/15/14 EKG   E coli bacteremia 06/18/2011   Elevated LDL cholesterol level 12/2018   ESRD (end stage renal disease) ([place]) 06/12/2011   Fibromyalgia    Gastroparesis    Gastroparesis    GERD (gastroesophageal reflux disease)    Glaucoma    right eye   Gout    H/O carpal tunnel syndrome    Headache 10/2019   Headache(784.0)    "not often anymore" (12/11/2011)   Herpes genitalia 1994   History of blood transfusion    "more than a few times" (12/11/2011)   History of stomach ulcers    Hypotension    Iron deficiency anemia    New onset a-fib ([place])    10/15/14 EKG   Osteopenia    Peripheral neuropathy 11/2018   Pressure ulcer of sacral region, stage 1 07/2019   Seizures ([place]) 1994   "post transplant;  only have had that one" (12/11/2011)   Spinal stenosis in cervical region    Stroke [facility])     left basal ganglia lacunar infarct; Right frontal lobe lacunar infarct.   Stroke [facility]) ~ 1999; 2001   "briefly lost my vision; lost my right eye" (12/11/2011)   Vitamin D deficiency 12/2018   Past Surgical History:  Procedure Laterality Date   ANTERIOR CERVICAL DECOMP/DISCECTOMY FUSION N/A 01/08/2015   Procedure: Anterior Cervical Three-Four/Four-Five Decompression/Diskectomy/Fusion;  Surgeon: Leeroy Cha, MD;  Location: [place] NEURO ORS;  Service: Neurosurgery;  Laterality: N/A;  C3-4 C4-5 Anterior cervical decompression/diskectomy/fusion   APPENDECTOMY  ~ 2004   BIOPSY  09/12/2020   Procedure: BIOPSY;  Surgeon: Milus Banister, MD;  Location: [facility] ENDOSCOPY;  Service: Endoscopy;;   CATARACT EXTRACTION     right eye   COLONOSCOPY     COLONOSCOPY WITH PROPOFOL N/A 09/12/2020   Procedure: COLONOSCOPY WITH PROPOFOL;  Surgeon: Milus Banister, MD;  Location: [facility] ENDOSCOPY;  Service: Endoscopy;  Laterality: N/A;   ENUCLEATION  2001   "right"   ESOPHAGOGASTRODUODENOSCOPY (EGD) WITH PROPOFOL N/A 04/21/2012   Procedure: ESOPHAGOGASTRODUODENOSCOPY (EGD) WITH PROPOFOL;  Surgeon: Milus Banister, MD;  Location: [facility];  Service: Endoscopy;  Laterality: N/A;   ESOPHAGOGASTRODUODENOSCOPY (EGD) WITH PROPOFOL N/A 09/12/2020   Procedure: ESOPHAGOGASTRODUODENOSCOPY (EGD) WITH PROPOFOL;  Surgeon: Milus Banister, MD;  Location: [facility] ENDOSCOPY;  Service: Endoscopy;  Laterality: N/A;   INSERTION OF DIALYSIS CATHETER  1988   "AV graft LUA & LFA; LUA worked for 1 day; LFA never worked"   IR FLUORO GUIDE CV LINE RIGHT  07/11/2019   IR FLUORO GUIDE CV LINE RIGHT  10/20/2019   IR FLUORO GUIDE CV LINE RIGHT  05/31/2020   IR PTA VENOUS EXCEPT DIALYSIS CIRCUIT  05/31/2020   IR RADIOLOGY PERIPHERAL GUIDED IV START  07/11/2019   IR US GUIDE VASC ACCESS RIGHT  07/11/2019   IR US GUIDE VASC ACCESS RIGHT  07/11/2019   IR US GUIDE VASC  ACCESS RIGHT  07/11/2019   IR VENOCAVAGRAM IVC  05/31/2020   KIDNEY TRANSPLANT  1994; 1999; 2005   "right"   MULTIPLE TOOTH EXTRACTIONS     POLYPECTOMY  09/12/2020   Procedure: POLYPECTOMY;  Surgeon: Milus Banister, MD;  Location: [facility] ENDOSCOPY;  Service: Endoscopy;;   RIGHT HEART CATH N/A 03/23/2019   Procedure: RIGHT HEART CATH;  Surgeon: Jolaine Artist, MD;  Location: [place] CV LAB;  Service: Cardiovascular;  Laterality: N/A;   [place]?; 1994; 2005   Patient Active Problem List   Diagnosis Date Noted   Bilateral shoulder pain 06/06/2021   Bilateral hand swelling 06/06/2021   Cervical radiculitis 01/24/2021   Acute respiratory failure with hypoxia ([place]) 10/23/2020   Coagulation defect, unspecified ([place]) 08/01/2020   Aortic atherosclerosis ([place]) 07/10/2020   Secondary hypercoagulable state ([place]) 04/23/2020   Atrial flutter ([place]) 03/29/2020   Chronic pain of both knees 03/13/2020   Chills (without fever) 12/12/2019   Other pancytopenia ([place])    Decubitus ulcer of sacral region, stage 1 08/18/2019   HCAP (healthcare-associated pneumonia) 08/05/2019   Pressure injury of skin 07/24/2019   Chest congestion 07/24/2019   Itching 07/24/2019   Weakness 06/13/2019   Pneumonia due to COVID-19 virus 05/09/2019   Hypotension 05/09/2019   Symptomatic anemia 05/09/2019   CHF (congestive heart failure) ([place]) 05/09/2019   Swollen abdomen 01/04/2019   DDD (degenerative disc disease), cervical 12/06/2018   Neuropathy 12/06/2018   Paresthesia 10/11/2018   Neck pain 10/11/2018   Tobacco abuse 11/20/2017   Right leg swelling 11/20/2017   Right leg pain 11/20/2017   Stroke (cerebrum) ([place]) 11/20/2017   GERD (gastroesophageal reflux disease) 11/20/2017   Acute venous embolism and thrombosis of deep vessels of proximal lower extremity, right ([place]) 11/20/2017   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    Thrush, oral     AKI (acute kidney injury) ([place])    Multifocal pneumonia 08/09/2017   ETD (Eustachian tube dysfunction), bilateral 08/03/2017   Acute recurrent pansinusitis 07/21/2017   Chest pain 09/29/2015   Cervical stenosis of spinal canal 01/08/2015   Urinary tract infectious disease    Muscle spasms of neck 06/15/2014   Bleeding hemorrhoid 06/15/2014   Anemia in chronic kidney disease 06/15/2014   Pyelonephritis, acute 06/13/2014   Sepsis ([place]) 06/13/2014   History of kidney transplant    Abnormal CT scan 09/14/2012   Chronic pain syndrome 04/09/2012   Dehydration, mild 04/09/2012   Herpes infection 06/23/2011   Anxiety 06/23/2011   E coli bacteremia 06/18/2011   ESRD (end stage renal disease) ([place]) 06/12/2011   UTI (  lower urinary tract infection) 06/12/2011   Bacteremia due to Gram-negative bacteria 05/23/2011   History of renal transplantation 05/22/2011   Septic shock(785.52) 05/22/2011   Acute on chronic kidney failure ([place]) 05/22/2011   Gastroparesis 04/24/2008   WEIGHT LOSS 08/24/2007   NAUSEA AND VOMITING 08/24/2007    REFERRING DIAG: M25.511,G89.29,M25.512 (ICD-10-CM) - Chronic pain of both shoulders  THERAPY DIAG:  Chronic left shoulder pain  Chronic right shoulder pain  Stiffness of left shoulder, not elsewhere classified  Stiffness of right shoulder, not elsewhere classified  Muscle weakness (generalized)  Rationale for Evaluation and Treatment Rehabilitation  SUBJECTIVE:                                                                                                                                                                                       SUBJECTIVE STATEMENT: Today my shoulders are feeling better.    PERTINENT HISTORY DDD cervical, fibromyalgia, spinal stenosis cervical, CHF   PAIN:  Are you having pain? Yes: NPRS scale: 8/10 Pain location: Upper shoulders to [place] jt to upper arms Pain description: ache and throb Aggravating factors: Just using  my arms Relieving factors: Meds, pain meds Pain range: 5-9/10  PERTINENT HISTORY: DDD cervical, fibromyalgia, spinal stenosis cervical, CHF   PRECAUTIONS: None   PATIENT GOALS More strength and mobility of her  shoulders   DIAGNOSTIC FINDINGS:  X-ray right shoulder 4 views 06/20/21  Glenohumeral joint space appears normal.  Normal internal and external  rotation of humerus.  AC joint space appears normal.  No visible erosions  or abnormal calcifications seen.   Impression   Normal appearing shoulder x-ray   X-ray left shoulder 4 views 06/20/21  Glenohumeral joint space appears normal.  Normal internal and external  rotation of humerus.  AC joint space appears normal.  No visible erosions  or abnormal calcifications seen.   Impression   Normal appearing shoulder x-ray   PATIENT SURVEYS:  FOTO 58% predicted 64%   COGNITION:           Overall cognitive status: Within functional limits for tasks assessed                                  SENSATION: WFL   POSTURE: Forward head and rounded shoulders   UPPER EXTREMITY ROM:     AROM/AAROM/PROM Right eval Left eval Rt AROM 08/06/21 L AROM 08/06/21 R Arom 08/20/21 L Arom 08/20/21  Shoulder flexion 85/118/115 88/110/110 105 107 110 110  Shoulder extension          Shoulder abduction  Shoulder adduction          Shoulder internal rotation          Shoulder external rotation          Elbow flexion          Elbow extension          Wrist flexion          Wrist extension          Wrist ulnar deviation          Wrist radial deviation          Wrist pronation          Wrist supination          (Blank rows = not tested)   UPPER EXTREMITY MMT: Tested strength in a neutral position due to pain. Pt demonstrated strength against resistance seeming at least 3/5. No overt weakness noted. MMT Right 7/17 Left 7/17  Shoulder flexion  2+  3-  Shoulder extension      Shoulder abduction      Shoulder adduction       Shoulder internal rotation  4-  4  Shoulder external rotation  3  3+  Middle trapezius      Lower trapezius      Elbow flexion      Elbow extension      Wrist flexion      Wrist extension      Wrist ulnar deviation      Wrist radial deviation      Wrist pronation      Wrist supination      Grip strength (lbs)      (Blank rows = not tested)   SHOULDER SPECIAL TESTS:            Impingement tests: Hawkins/Kennedy impingement test: positive             Rotator cuff assessment: Empty can test: positive  and Full can test: positive             Both tests were painful            Biceps assessment: Yergason's test: positive    PALPATION:  TTP to the upper rap and peri-GH area             TODAY'S TREATMENT:  [facility] Adult PT Treatment:                                                DATE: 08/20/21 Therapeutic Exercise: Shoulder pulley flexion 2 mins Shoulder row 2x10 YTB Shoulder ext 2x10 YTB Pendulum flex and ext c 4# Table slides for UE flexion x 10, scaption  x 10 each Bilat shoulder ER YTB x10, min ROM  Supine chest press AAROM shoulder flexion  x 10, slow and painful  Manual Therapy: PROM all planes bilaterally with light distraction in various angles   Modalities 10 min bilateral shoulders MHP    [facility] Adult PT Treatment:                                                DATE: 08/18/21 Therapeutic Exercise: Supine chest press AAROM shoulder flexion  x 10, slow and painful  ER/IR with  dowel unable to tolerate AAROM  Table slides for bilateral UE flexion x 10, scaption  x 10  ER /IR open chain in neutral x 10 Then added YTB x 10 with ER/IR, min ROM  Head rolls , shoulder rolls for reset between sets  Standing ball rolls on high mat table x 10  Ball press bilateral shoulders x 10 isometric extension   Manual Therapy: PROM all planes bilateral, light distraction in various angles   10 min bilateral shoulders MHP    [facility] Adult PT Treatment:                                                 DATE: 08/06/21 Therapeutic Exercise: Shoulder pulley flexion 2 mins Table top slides shoulder flexion x15 each Wall shoulder slides flexion x10 each Shoulder row 2x10 YTB Shoulder ext 2x10 YTB Shoulder Isometrics flex,abd, ER, ext 2x3 5"  [facility] Adult PT Treatment:                                                DATE: 07/25/21 Therapeutic Exercise: Nustep  6 mins L1 UE/LE Shoulder flexion on table top 2x10 Shoulder flexion wall slides x10 Shoulder row 2x10 YTB Shoulder ext 2x10 YTB  [facility] Adult PT Treatment: Eval                                               DATE: 07/18/21 Therapeutic Exercise: Standing Shoulder Flexion Wall Walk  5 reps 3 hold  Seated Shoulder Flexion Slide at Table Top with Forearm in Neutral 5 reps 3 hold   PATIENT EDUCATION: Education details: HEP, POC,  Person educated: Patient Education method: Explanation, Demonstration, Tactile cues, Verbal cues, and Handouts Education comprehension: verbalized understanding, returned demonstration, verbal cues required, and tactile cues required     HOME EXERCISE PROGRAM: Access Code: [email or web address]   ASSESSMENT:   CLINICAL IMPRESSION: PT was completed for bilat shoulder mobility and strengthening therex with completion as tolerated. Pt has chronic issues c shoulder pain, but her pain level was decreased today and pt was able to participate better in PT. AROM for both shoulder's was improved. Shoulder flex/ext pendulum exs c weight, 4#, was completed to help pt complete mobility c distraction to help minimize pain. Moist heat was provided at the end of the session for pain relief. Pt tolerated the session without adverse effects.   OBJECTIVE IMPAIRMENTS decreased activity tolerance, decreased ROM, decreased strength, increased fascial restrictions, increased muscle spasms, impaired UE functional use, postural dysfunction, and pain.    ACTIVITY LIMITATIONS carrying, lifting, bending, sleeping, bed mobility, bathing,  toileting, dressing, reach over head, hygiene/grooming, and caring for others   PARTICIPATION LIMITATIONS: meal prep, cleaning, laundry, driving, shopping, community activity, and yard work   PERSONAL FACTORS Past/current experiences, Time since onset of injury/illness/exacerbation, and 3+ comorbidities:    DDD cervical, fibromyalgia, spinal stenosis cervical are also affecting patient's functional outcome.    REHAB POTENTIAL: Fair due to chronicity   CLINICAL DECISION MAKING: Evolving/moderate complexity   EVALUATION COMPLEXITY: Moderate     GOALS:     LONG TERM GOALS:  Target date: 09/05/21    Pt's shoulder AROM will increase to R 105d and L 100d for improved shoulder function Baseline: about 90 deg today Goal status: ongoing    2.  Pt will demonstrate 4/5 shoulder strength for improved shoulder function Baseline: 3/5  Goal status: ongoing    3.  Pt's FOTO score will improve to 64% as indication of improved function Baseline: 58% Goal status:ongoing    4.  Pt will be Ind in a HEP to maintain achieved LOF Baseline:  Goal status: ongoing    PLAN: PT FREQUENCY: 2x/week   PT DURATION: 6 weeks   PLANNED INTERVENTIONS: Therapeutic exercises, Therapeutic activity, Patient/Family education, Joint mobilization, Aquatic Therapy, Dry Needling, Cryotherapy, Moist heat, Taping, Ultrasound, Ionotophoresis '4mg'$ /ml Dexamethasone, Manual therapy, and Re-evaluation   PLAN FOR NEXT SESSION:  ROM, as tolerated. Pulleys, table slides . use of modalities, manual therapy and TPDN as inidcated  Sameera Betton MS, PT 08/20/21 1:03 PM

## 2021-08-22 NOTE — Therapy (Deleted)
OUTPATIENT PHYSICAL THERAPY TREATMENT NOTE   Patient Name: Tina Watkins MRN: [id number] DOB:02-02-1966, 56 y.o., female Today's Date: 08/22/2021  PCP: Bo Merino I, NP REFERRING PROVIDER: Collier Salina, MD  END OF SESSION:        Past Medical History:  Diagnosis Date   Bacteremia due to Gram-negative bacteria 05/23/2011   Blind    right eye   Blind right eye    CHF (congestive heart failure) ([place])    Chronic lower back pain    Complication of anesthesia    "woke up during OR; I have an extremely high tolerance" (12/11/2011) 1 procedure was graft; the other procedures were procedures that are typically done with sedation.   DDD (degenerative disc disease), cervical    Depression    Dysrhythmia    "tachycardia" (12/11/2011) new onset afib 10/15/14 EKG   E coli bacteremia 06/18/2011   Elevated LDL cholesterol level 12/2018   ESRD (end stage renal disease) ([place]) 06/12/2011   Fibromyalgia    Gastroparesis    Gastroparesis    GERD (gastroesophageal reflux disease)    Glaucoma    right eye   Gout    H/O carpal tunnel syndrome    Headache 10/2019   Headache(784.0)    "not often anymore" (12/11/2011)   Herpes genitalia 1994   History of blood transfusion    "more than a few times" (12/11/2011)   History of stomach ulcers    Hypotension    Iron deficiency anemia    New onset a-fib ([place])    10/15/14 EKG   Osteopenia    Peripheral neuropathy 11/2018   Pressure ulcer of sacral region, stage 1 07/2019   Seizures ([place]) 1994   "post transplant; only have had that one" (12/11/2011)   Spinal stenosis in cervical region    Stroke [facility])     left basal ganglia lacunar infarct; Right frontal lobe lacunar infarct.   Stroke [facility]) ~ 1999; 2001   "briefly lost my vision; lost my right eye" (12/11/2011)   Vitamin D deficiency 12/2018   Past Surgical History:  Procedure Laterality Date   ANTERIOR CERVICAL DECOMP/DISCECTOMY FUSION N/A 01/08/2015   Procedure: Anterior  Cervical Three-Four/Four-Five Decompression/Diskectomy/Fusion;  Surgeon: Leeroy Cha, MD;  Location: [place] [facility] OR[facility];  Service: Neurosurgery;  Laterality: N/A;  C3-4 C4-5 Anterior cervical decompression/diskectomy/fusion   APPENDECTOMY  ~ 2004   BIOPSY  09/12/2020   Procedure: BIOPSY;  Surgeon: Milus Banister, MD;  Location: [facility] ENDOSCOPY;  Service: Endoscopy;;   CATARACT EXTRACTION     right eye   COLONOSCOPY     COLONOSCOPY WITH PROPOFOL N/A 09/12/2020   Procedure: COLONOSCOPY WITH PROPOFOL;  Surgeon: Milus Banister, MD;  Location: [facility] ENDOSCOPY;  Service: Endoscopy;  Laterality: N/A;   ENUCLEATION  2001   "right"   ESOPHAGOGASTRODUODENOSCOPY (EGD) WITH PROPOFOL N/A 04/21/2012   Procedure: ESOPHAGOGASTRODUODENOSCOPY (EGD) WITH PROPOFOL;  Surgeon: Milus Banister, MD;  Location: [facility];  Service: Endoscopy;  Laterality: N/A;   ESOPHAGOGASTRODUODENOSCOPY (EGD) WITH PROPOFOL N/A 09/12/2020   Procedure: ESOPHAGOGASTRODUODENOSCOPY (EGD) WITH PROPOFOL;  Surgeon: Milus Banister, MD;  Location: [facility];  Service: Endoscopy;  Laterality: N/A;   INSERTION OF DIALYSIS CATHETER  1988   "AV graft LUA & LFA; LUA worked for 1 day; LFA never worked"   IR FLUORO GUIDE CV LINE RIGHT  07/11/2019   IR FLUORO GUIDE CV LINE RIGHT  10/20/2019   IR FLUORO GUIDE CV LINE RIGHT  05/31/2020   IR PTA VENOUS EXCEPT  DIALYSIS CIRCUIT  05/31/2020   IR RADIOLOGY PERIPHERAL GUIDED IV START  07/11/2019   IR US GUIDE VASC ACCESS RIGHT  07/11/2019   IR US GUIDE VASC ACCESS RIGHT  07/11/2019   IR US GUIDE VASC ACCESS RIGHT  07/11/2019   IR [place] IVC  05/31/2020   KIDNEY TRANSPLANT  1994; 1999; 2005   "right"   MULTIPLE TOOTH EXTRACTIONS     POLYPECTOMY  09/12/2020   Procedure: POLYPECTOMY;  Surgeon: Milus Banister, MD;  Location: [facility];  Service: Endoscopy;;   RIGHT HEART CATH N/A 03/23/2019   Procedure: RIGHT HEART CATH;  Surgeon: Jolaine Artist, MD;  Location: [place] CV LAB;  Service:  Cardiovascular;  Laterality: N/A;   Tea?; 1994; 2005   Patient Active Problem List   Diagnosis Date Noted   Bilateral shoulder pain 06/06/2021   Bilateral hand swelling 06/06/2021   Cervical radiculitis 01/24/2021   Acute respiratory failure with hypoxia ([place]) 10/23/2020   Coagulation defect, unspecified ([place]) 08/01/2020   Aortic atherosclerosis ([place]) 07/10/2020   Secondary hypercoagulable state ([place]) 04/23/2020   Atrial flutter ([place]) 03/29/2020   Chronic pain of both knees 03/13/2020   Chills (without fever) 12/12/2019   Other pancytopenia ([place])    Decubitus ulcer of sacral region, stage 1 08/18/2019   HCAP (healthcare-associated pneumonia) 08/05/2019   Pressure injury of skin 07/24/2019   Chest congestion 07/24/2019   Itching 07/24/2019   Weakness 06/13/2019   Pneumonia due to COVID-19 virus 05/09/2019   Hypotension 05/09/2019   Symptomatic anemia 05/09/2019   CHF (congestive heart failure) ([place]) 05/09/2019   Swollen abdomen 01/04/2019   DDD (degenerative disc disease), cervical 12/06/2018   Neuropathy 12/06/2018   Paresthesia 10/11/2018   Neck pain 10/11/2018   Tobacco abuse 11/20/2017   Right leg swelling 11/20/2017   Right leg pain 11/20/2017   Stroke (cerebrum) ([place]) 11/20/2017   GERD (gastroesophageal reflux disease) 11/20/2017   Acute venous embolism and thrombosis of deep vessels of proximal lower extremity, right ([place]) 11/20/2017   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    Thrush, oral    AKI (acute kidney injury) ([place])    Multifocal pneumonia 08/09/2017   ETD (Eustachian tube dysfunction), bilateral 08/03/2017   Acute recurrent pansinusitis 07/21/2017   Chest pain 09/29/2015   Cervical stenosis of spinal canal 01/08/2015   Urinary tract infectious disease    Muscle spasms of neck 06/15/2014   Bleeding hemorrhoid 06/15/2014   Anemia in chronic kidney disease 06/15/2014    Pyelonephritis, acute 06/13/2014   Sepsis ([place]) 06/13/2014   History of kidney transplant    Abnormal CT scan 09/14/2012   Chronic pain syndrome 04/09/2012   Dehydration, mild 04/09/2012   Herpes infection 06/23/2011   Anxiety 06/23/2011   E coli bacteremia 06/18/2011   ESRD (end stage renal disease) ([place]) 06/12/2011   UTI (lower urinary tract infection) 06/12/2011   Bacteremia due to Gram-negative bacteria 05/23/2011   History of renal transplantation 05/22/2011   Septic shock(785.52) 05/22/2011   Acute on chronic kidney failure ([place]) 05/22/2011   Gastroparesis 04/24/2008   WEIGHT LOSS 08/24/2007   NAUSEA AND VOMITING 08/24/2007    REFERRING DIAG: M25.511,G89.29,M25.512 (ICD-10-CM) - Chronic pain of both shoulders  THERAPY DIAG:  No diagnosis found.  Rationale for Evaluation and Treatment Rehabilitation  SUBJECTIVE:  SUBJECTIVE STATEMENT: Today my shoulders are feeling better.    PERTINENT HISTORY DDD cervical, fibromyalgia, spinal stenosis cervical, CHF   PAIN:  Are you having pain? Yes: NPRS scale: 8/10 Pain location: Upper shoulders to [place] jt to upper arms Pain description: ache and throb Aggravating factors: Just using my arms Relieving factors: Meds, pain meds Pain range: 5-9/10  PERTINENT HISTORY: DDD cervical, fibromyalgia, spinal stenosis cervical, CHF   PRECAUTIONS: None   PATIENT GOALS More strength and mobility of her  shoulders   DIAGNOSTIC FINDINGS:  X-ray right shoulder 4 views 06/20/21  Glenohumeral joint space appears normal.  Normal internal and external  rotation of humerus.  AC joint space appears normal.  No visible erosions  or abnormal calcifications seen.   Impression   Normal appearing shoulder x-ray   X-ray left shoulder 4 views 06/20/21  Glenohumeral  joint space appears normal.  Normal internal and external  rotation of humerus.  AC joint space appears normal.  No visible erosions  or abnormal calcifications seen.   Impression   Normal appearing shoulder x-ray   PATIENT SURVEYS:  FOTO 58% predicted 64%   COGNITION:           Overall cognitive status: Within functional limits for tasks assessed                                  SENSATION: WFL   POSTURE: Forward head and rounded shoulders   UPPER EXTREMITY ROM:     AROM/AAROM/PROM Right eval Left eval Rt AROM 08/06/21 L AROM 08/06/21 R Arom 08/20/21 L Arom 08/20/21  Shoulder flexion 85/118/115 88/110/110 105 107 110 110  Shoulder extension          Shoulder abduction          Shoulder adduction          Shoulder internal rotation          Shoulder external rotation          Elbow flexion          Elbow extension          Wrist flexion          Wrist extension          Wrist ulnar deviation          Wrist radial deviation          Wrist pronation          Wrist supination          (Blank rows = not tested)   UPPER EXTREMITY MMT: Tested strength in a neutral position due to pain. Pt demonstrated strength against resistance seeming at least 3/5. No overt weakness noted. MMT Right 7/17 Left 7/17  Shoulder flexion  2+  3-  Shoulder extension      Shoulder abduction      Shoulder adduction      Shoulder internal rotation  4-  4  Shoulder external rotation  3  3+  Middle trapezius      Lower trapezius      Elbow flexion      Elbow extension      Wrist flexion      Wrist extension      Wrist ulnar deviation      Wrist radial deviation      Wrist pronation      Wrist supination      Grip strength (lbs)      (  Blank rows = not tested)   SHOULDER SPECIAL TESTS:            Impingement tests: Hawkins/Kennedy impingement test: positive             Rotator cuff assessment: Empty can test: positive  and Full can test: positive             Both tests were painful             Biceps assessment: Yergason's test: positive    PALPATION:  TTP to the upper rap and peri-GH area             TODAY'S TREATMENT:   [facility] Adult PT Treatment:                                                DATE: 08/25/21 Therapeutic Exercise: *** Manual Therapy: *** Neuromuscular re-ed: *** Therapeutic Activity: *** Modalities: *** Self Care: ***   [facility] Adult PT Treatment:                                                DATE: 08/20/21 Therapeutic Exercise: Shoulder pulley flexion 2 mins Shoulder row 2x10 YTB Shoulder ext 2x10 YTB Pendulum flex and ext c 4# Table slides for UE flexion x 10, scaption  x 10 each Bilat shoulder ER YTB x10, min ROM  Supine chest press AAROM shoulder flexion  x 10, slow and painful  Manual Therapy: PROM all planes bilaterally with light distraction in various angles   Modalities 10 min bilateral shoulders MHP    [facility] Adult PT Treatment:                                                DATE: 08/18/21 Therapeutic Exercise: Supine chest press AAROM shoulder flexion  x 10, slow and painful  ER/IR with dowel unable to tolerate AAROM  Table slides for bilateral UE flexion x 10, scaption  x 10  ER /IR open chain in neutral x 10 Then added YTB x 10 with ER/IR, min ROM  Head rolls , shoulder rolls for reset between sets  Standing ball rolls on high mat table x 10  Ball press bilateral shoulders x 10 isometric extension   Manual Therapy: PROM all planes bilateral, light distraction in various angles   10 min bilateral shoulders MHP    [facility] Adult PT Treatment:                                                DATE: 08/06/21 Therapeutic Exercise: Shoulder pulley flexion 2 mins Table top slides shoulder flexion x15 each Wall shoulder slides flexion x10 each Shoulder row 2x10 YTB Shoulder ext 2x10 YTB Shoulder Isometrics flex,abd, ER, ext 2x3 5"  [facility] Adult PT Treatment:  DATE: 07/25/21 Therapeutic  Exercise: Nustep  6 mins L1 UE/LE Shoulder flexion on table top 2x10 Shoulder flexion wall slides x10 Shoulder row 2x10 YTB Shoulder ext 2x10 YTB  [facility] Adult PT Treatment: Eval                                               DATE: 07/18/21 Therapeutic Exercise: Standing Shoulder Flexion Wall Walk  5 reps 3 hold  Seated Shoulder Flexion Slide at Table Top with Forearm in Neutral 5 reps 3 hold   PATIENT EDUCATION: Education details: HEP, POC,  Person educated: Patient Education method: Explanation, Demonstration, Tactile cues, Verbal cues, and Handouts Education comprehension: verbalized understanding, returned demonstration, verbal cues required, and tactile cues required     HOME EXERCISE PROGRAM: Access Code: [email or web address]   ASSESSMENT:   CLINICAL IMPRESSION: PT was completed for bilat shoulder mobility and strengthening therex with completion as tolerated. Pt has chronic issues c shoulder pain, but her pain level was decreased today and pt was able to participate better in PT. AROM for both shoulder's was improved. Shoulder flex/ext pendulum exs c weight, 4#, was completed to help pt complete mobility c distraction to help minimize pain. Moist heat was provided at the end of the session for pain relief. Pt tolerated the session without adverse effects.   OBJECTIVE IMPAIRMENTS decreased activity tolerance, decreased ROM, decreased strength, increased fascial restrictions, increased muscle spasms, impaired UE functional use, postural dysfunction, and pain.    ACTIVITY LIMITATIONS carrying, lifting, bending, sleeping, bed mobility, bathing, toileting, dressing, reach over head, hygiene/grooming, and caring for others   PARTICIPATION LIMITATIONS: meal prep, cleaning, laundry, driving, shopping, community activity, and yard work   PERSONAL FACTORS Past/current experiences, Time since onset of injury/illness/exacerbation, and 3+ comorbidities:    DDD cervical, fibromyalgia, spinal stenosis  cervical are also affecting patient's functional outcome.    REHAB POTENTIAL: Fair due to chronicity   CLINICAL DECISION MAKING: Evolving/moderate complexity   EVALUATION COMPLEXITY: Moderate     GOALS:     LONG TERM GOALS: Target date: 09/05/21    Pt's shoulder AROM will increase to R 105d and L 100d for improved shoulder function Baseline: about 90 deg today Goal status: ongoing    2.  Pt will demonstrate 4/5 shoulder strength for improved shoulder function Baseline: 3/5  Goal status: ongoing    3.  Pt's FOTO score will improve to 64% as indication of improved function Baseline: 58% Goal status:ongoing    4.  Pt will be Ind in a HEP to maintain achieved LOF Baseline:  Goal status: ongoing    PLAN: PT FREQUENCY: 2x/week   PT DURATION: 6 weeks   PLANNED INTERVENTIONS: Therapeutic exercises, Therapeutic activity, Patient/Family education, Joint mobilization, Aquatic Therapy, Dry Needling, Cryotherapy, Moist heat, Taping, Ultrasound, Ionotophoresis '4mg'$ /ml Dexamethasone, Manual therapy, and Re-evaluation   PLAN FOR NEXT SESSION:  ROM, as tolerated. Pulleys, table slides . use of modalities, manual therapy and TPDN as inidcated  Allen Ralls MS, PT 08/22/21 12:03 PM

## 2021-08-25 ENCOUNTER — Ambulatory Visit: Payer: Medicare Other | Admitting: Physical Therapy

## 2021-08-26 ENCOUNTER — Other Ambulatory Visit: Payer: Self-pay

## 2021-08-26 DIAGNOSIS — F17210 Nicotine dependence, cigarettes, uncomplicated: Secondary | ICD-10-CM

## 2021-08-26 DIAGNOSIS — Z87891 Personal history of nicotine dependence: Secondary | ICD-10-CM

## 2021-08-26 DIAGNOSIS — Z122 Encounter for screening for malignant neoplasm of respiratory organs: Secondary | ICD-10-CM

## 2021-09-03 ENCOUNTER — Ambulatory Visit: Payer: Medicare Other | Attending: Internal Medicine

## 2021-09-03 DIAGNOSIS — M25611 Stiffness of right shoulder, not elsewhere classified: Secondary | ICD-10-CM | POA: Insufficient documentation

## 2021-09-03 DIAGNOSIS — M6281 Muscle weakness (generalized): Secondary | ICD-10-CM | POA: Diagnosis present

## 2021-09-03 DIAGNOSIS — M25511 Pain in right shoulder: Secondary | ICD-10-CM | POA: Insufficient documentation

## 2021-09-03 DIAGNOSIS — G8929 Other chronic pain: Secondary | ICD-10-CM | POA: Insufficient documentation

## 2021-09-03 DIAGNOSIS — M25512 Pain in left shoulder: Secondary | ICD-10-CM | POA: Diagnosis present

## 2021-09-03 DIAGNOSIS — M25612 Stiffness of left shoulder, not elsewhere classified: Secondary | ICD-10-CM | POA: Insufficient documentation

## 2021-09-03 NOTE — Therapy (Signed)
OUTPATIENT PHYSICAL THERAPY TREATMENT NOTE   Patient Name: Tina Watkins MRN: [id number] DOB:Dec 10, 1965, 56 y.o., female Today's Date: 09/03/2021  PCP: Bo Merino I, NP REFERRING PROVIDER: Collier Salina, MD  END OF SESSION:   PT End of Session - 09/03/21 1531     Visit Number 6    Number of Visits 13    Date for PT Re-Evaluation 09/05/21    Authorization Type MEDICARE PART A AND B; MEDICAID [place] ACCESS    PT Start Time [phone]    PT Stop Time 1018    PT Time Calculation (min) 42 min    Activity Tolerance Patient tolerated treatment well;Patient limited by pain    Behavior During Therapy [facility] for tasks assessed/performed                 Past Medical History:  Diagnosis Date   Bacteremia due to Gram-negative bacteria 05/23/2011   Blind    right eye   Blind right eye    CHF (congestive heart failure) ([place])    Chronic lower back pain    Complication of anesthesia    "woke up during OR; I have an extremely high tolerance" (12/11/2011) 1 procedure was graft; the other procedures were procedures that are typically done with sedation.   DDD (degenerative disc disease), cervical    Depression    Dysrhythmia    "tachycardia" (12/11/2011) new onset afib 10/15/14 EKG   E coli bacteremia 06/18/2011   Elevated LDL cholesterol level 12/2018   ESRD (end stage renal disease) ([place]) 06/12/2011   Fibromyalgia    Gastroparesis    Gastroparesis    GERD (gastroesophageal reflux disease)    Glaucoma    right eye   Gout    H/O carpal tunnel syndrome    Headache 10/2019   Headache(784.0)    "not often anymore" (12/11/2011)   Herpes genitalia 1994   History of blood transfusion    "more than a few times" (12/11/2011)   History of stomach ulcers    Hypotension    Iron deficiency anemia    New onset a-fib ([place])    10/15/14 EKG   Osteopenia    Peripheral neuropathy 11/2018   Pressure ulcer of sacral region, stage 1 07/2019   Seizures ([place]) 1994   "post transplant;  only have had that one" (12/11/2011)   Spinal stenosis in cervical region    Stroke [facility])     left basal ganglia lacunar infarct; Right frontal lobe lacunar infarct.   Stroke [facility]) ~ 1999; 2001   "briefly lost my vision; lost my right eye" (12/11/2011)   Vitamin D deficiency 12/2018   Past Surgical History:  Procedure Laterality Date   ANTERIOR CERVICAL DECOMP/DISCECTOMY FUSION N/A 01/08/2015   Procedure: Anterior Cervical Three-Four/Four-Five Decompression/Diskectomy/Fusion;  Surgeon: Leeroy Cha, MD;  Location: [place] NEURO ORS;  Service: Neurosurgery;  Laterality: N/A;  C3-4 C4-5 Anterior cervical decompression/diskectomy/fusion   APPENDECTOMY  ~ 2004   BIOPSY  09/12/2020   Procedure: BIOPSY;  Surgeon: Milus Banister, MD;  Location: [facility] ENDOSCOPY;  Service: Endoscopy;;   CATARACT EXTRACTION     right eye   COLONOSCOPY     COLONOSCOPY WITH PROPOFOL N/A 09/12/2020   Procedure: COLONOSCOPY WITH PROPOFOL;  Surgeon: Milus Banister, MD;  Location: [facility] ENDOSCOPY;  Service: Endoscopy;  Laterality: N/A;   ENUCLEATION  2001   "right"   ESOPHAGOGASTRODUODENOSCOPY (EGD) WITH PROPOFOL N/A 04/21/2012   Procedure: ESOPHAGOGASTRODUODENOSCOPY (EGD) WITH PROPOFOL;  Surgeon: Milus Banister, MD;  Location:  [facility] ENDOSCOPY;  Service: Endoscopy;  Laterality: N/A;   ESOPHAGOGASTRODUODENOSCOPY (EGD) WITH PROPOFOL N/A 09/12/2020   Procedure: ESOPHAGOGASTRODUODENOSCOPY (EGD) WITH PROPOFOL;  Surgeon: Rachael Fee, MD;  Location: [facility] ENDOSCOPY;  Service: Endoscopy;  Laterality: N/A;   INSERTION OF DIALYSIS CATHETER  1988   "AV graft LUA & LFA; LUA worked for 1 day; LFA never worked"   IR FLUORO GUIDE CV LINE RIGHT  07/11/2019   IR FLUORO GUIDE CV LINE RIGHT  10/20/2019   IR FLUORO GUIDE CV LINE RIGHT  05/31/2020   IR PTA VENOUS EXCEPT DIALYSIS CIRCUIT  05/31/2020   IR RADIOLOGY PERIPHERAL GUIDED IV START  07/11/2019   IR US GUIDE VASC ACCESS RIGHT  07/11/2019   IR US GUIDE VASC ACCESS RIGHT  07/11/2019   IR US GUIDE VASC  ACCESS RIGHT  07/11/2019   IR VENOCAVAGRAM IVC  05/31/2020   KIDNEY TRANSPLANT  1994; 1999; 2005   "right"   MULTIPLE TOOTH EXTRACTIONS     POLYPECTOMY  09/12/2020   Procedure: POLYPECTOMY;  Surgeon: Rachael Fee, MD;  Location: [facility] ENDOSCOPY;  Service: Endoscopy;;   RIGHT HEART CATH N/A 03/23/2019   Procedure: RIGHT HEART CATH;  Surgeon: Dolores Patty, MD;  Location: [facility] INVASIVE CV LAB;  Service: Cardiovascular;  Laterality: N/A;   TONSILLECTOMY     TOTAL NEPHRECTOMY  1988?; 1994; 2005   Patient Active Problem List   Diagnosis Date Noted   Bilateral shoulder pain 06/06/2021   Bilateral hand swelling 06/06/2021   Cervical radiculitis 01/24/2021   Acute respiratory failure with hypoxia (HCC) 10/23/2020   Coagulation defect, unspecified (HCC) 08/01/2020   Aortic atherosclerosis (HCC) 07/10/2020   Secondary hypercoagulable state (HCC) 04/23/2020   Atrial flutter (HCC) 03/29/2020   Chronic pain of both knees 03/13/2020   Chills (without fever) 12/12/2019   Other pancytopenia (HCC)    Decubitus ulcer of sacral region, stage 1 08/18/2019   HCAP (healthcare-associated pneumonia) 08/05/2019   Pressure injury of skin 07/24/2019   Chest congestion 07/24/2019   Itching 07/24/2019   Weakness 06/13/2019   Pneumonia due to COVID-19 virus 05/09/2019   Hypotension 05/09/2019   Symptomatic anemia 05/09/2019   CHF (congestive heart failure) (HCC) 05/09/2019   Swollen abdomen 01/04/2019   DDD (degenerative disc disease), cervical 12/06/2018   Neuropathy 12/06/2018   Paresthesia 10/11/2018   Neck pain 10/11/2018   Tobacco abuse 11/20/2017   Right leg swelling 11/20/2017   Right leg pain 11/20/2017   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   GERD (gastroesophageal reflux disease) 11/20/2017   Acute venous embolism and thrombosis of deep vessels of proximal lower extremity, right (HCC) 11/20/2017   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    Thrush, oral     AKI (acute kidney injury) (HCC)    Multifocal pneumonia 08/09/2017   ETD (Eustachian tube dysfunction), bilateral 08/03/2017   Acute recurrent pansinusitis 07/21/2017   Chest pain 09/29/2015   Cervical stenosis of spinal canal 01/08/2015   Urinary tract infectious disease    Muscle spasms of neck 06/15/2014   Bleeding hemorrhoid 06/15/2014   Anemia in chronic kidney disease 06/15/2014   Pyelonephritis, acute 06/13/2014   Sepsis (HCC) 06/13/2014   History of kidney transplant    Abnormal CT scan 09/14/2012   Chronic pain syndrome 04/09/2012   Dehydration, mild 04/09/2012   Herpes infection 06/23/2011   Anxiety 06/23/2011   E coli bacteremia 06/18/2011   ESRD (end stage renal disease) (HCC) 06/12/2011  UTI (lower urinary tract infection) 06/12/2011   Bacteremia due to Gram-negative bacteria 05/23/2011   History of renal transplantation 05/22/2011   Septic shock(785.52) 05/22/2011   Acute on chronic kidney failure ([place]) 05/22/2011   Gastroparesis 04/24/2008   WEIGHT LOSS 08/24/2007   NAUSEA AND VOMITING 08/24/2007    REFERRING DIAG: M25.511,G89.29,M25.512 (ICD-10-CM) - Chronic pain of both shoulders  THERAPY DIAG:  Chronic left shoulder pain  Chronic right shoulder pain  Stiffness of left shoulder, not elsewhere classified  Stiffness of right shoulder, not elsewhere classified  Muscle weakness (generalized)  Rationale for Evaluation and Treatment Rehabilitation  SUBJECTIVE:                                                                                                                                                                                       SUBJECTIVE STATEMENT: Pt reports her bilat shoulder pain is overall worse with the intensity varying on activity level. Pt reports she sees Dr. Benjamine Mola on 09/26/21.    PERTINENT HISTORY DDD cervical, fibromyalgia, spinal stenosis cervical, CHF   PAIN:  Are you having pain? Yes: NPRS scale: 7/10 Pain location: Upper  shoulders to [place] jt to upper arms Pain description: ache and throb Aggravating factors: Just using my arms Relieving factors: Meds, pain meds Pain range: 5-9/10  PERTINENT HISTORY: DDD cervical, fibromyalgia, spinal stenosis cervical, CHF   PRECAUTIONS: None   PATIENT GOALS More strength and mobility of her  shoulders   DIAGNOSTIC FINDINGS:  X-ray right shoulder 4 views 06/20/21  Glenohumeral joint space appears normal.  Normal internal and external  rotation of humerus.  AC joint space appears normal.  No visible erosions  or abnormal calcifications seen.   Impression   Normal appearing shoulder x-ray   X-ray left shoulder 4 views 06/20/21  Glenohumeral joint space appears normal.  Normal internal and external  rotation of humerus.  AC joint space appears normal.  No visible erosions  or abnormal calcifications seen.   Impression   Normal appearing shoulder x-ray   PATIENT SURVEYS:  FOTO 58% predicted 64%   COGNITION:           Overall cognitive status: Within functional limits for tasks assessed                                  SENSATION: WFL   POSTURE: Forward head and rounded shoulders   UPPER EXTREMITY ROM:     AROM/AAROM/PROM Right eval Left eval Rt AROM 08/06/21 L AROM 08/06/21 R Arom 08/20/21 L Arom 08/20/21 R AROM 09/03/21 L ARO8/2/71M  Shoulder flexion 85/118/115 88/110/110  105 107 110 110 101 100  Shoulder extension            Shoulder abduction            Shoulder adduction            Shoulder internal rotation            Shoulder external rotation            Elbow flexion            Elbow extension            Wrist flexion            Wrist extension            Wrist ulnar deviation            Wrist radial deviation            Wrist pronation            Wrist supination            (Blank rows = not tested)   UPPER EXTREMITY MMT: Tested strength in a neutral position due to pain. Pt demonstrated strength against resistance seeming at least  3/5. No overt weakness noted. MMT Right 7/17 Left 7/17 Rt 09/03/21 Lt 09/03/21  Shoulder flexion  2+  3- 3 3  Shoulder extension        Shoulder abduction        Shoulder adduction        Shoulder internal rotation  4-  $R'4 4 4  'kr$ Shoulder external rotation  3  3+ 3+ 3+  Middle trapezius        Lower trapezius        Elbow flexion        Elbow extension        Wrist flexion        Wrist extension        Wrist ulnar deviation        Wrist radial deviation        Wrist pronation        Wrist supination        Grip strength (lbs)        (Blank rows = not tested)   SHOULDER SPECIAL TESTS:            Impingement tests: [place] impingement test: positive             Rotator cuff assessment: Empty can test: positive  and Full can test: positive             Both tests were painful            Biceps assessment: Yergason's test: positive    PALPATION:  TTP to the upper trap and peri-GH area             TODAY'S TREATMENT:  [facility] Adult PT Treatment:                                                DATE: 09/03/21 Shoulder pulley flexion 2 mins Shoulder row 2x10 YTB Shoulder ext 2x10 YTB Pendulum flex and ext c 4# Table slides for UE flexion x 10, scaption  x 10 each Bilat shoulder ER YTB x10, min ROM  Supine chest press AAROM shoulder flexion  x 10, slow and painful  Manual Therapy:  PROM all planes bilaterally with light distraction in various angles  [facility] Adult PT Treatment:                                                DATE: 08/20/21 Therapeutic Exercise: Shoulder pulley flexion 2 mins Shoulder row 2x10 YTB Shoulder ext 2x10 YTB Pendulum flex and ext c 4# Table slides for UE flexion x 10, scaption  x 10 each Bilat shoulder ER YTB x10, min ROM  Supine chest press AAROM shoulder flexion  x 10, slow and painful  Manual Therapy: PROM all planes bilaterally with light distraction in various angles   Modalities 10 min bilateral shoulders MHP    [facility] Adult PT Treatment:                                                 DATE: 08/18/21 Therapeutic Exercise: Supine chest press AAROM shoulder flexion  x 10, slow and painful  ER/IR with dowel unable to tolerate AAROM  Table slides for bilateral UE flexion x 10, scaption  x 10  ER /IR open chain in neutral x 10 Then added YTB x 10 with ER/IR, min ROM  Head rolls , shoulder rolls for reset between sets  Standing ball rolls on high mat table x 10  Ball press bilateral shoulders x 10 isometric extension   Manual Therapy: PROM all planes bilateral, light distraction in various angles   10 min bilateral shoulders MHP   PATIENT EDUCATION: Education details: HEP, POC,  Person educated: Patient Education method: Explanation, Demonstration, Tactile cues, Verbal cues, and Handouts Education comprehension: verbalized understanding, returned demonstration, verbal cues required, and tactile cues required     HOME EXERCISE PROGRAM: Access Code: [email or web address]   ASSESSMENT:   CLINICAL IMPRESSION: Pt participated in PT for bilat shoulder strengthening and ROM. Reassessment found bilat shoulder strength to be improved, while AROM for shoulder flex was found minimally decreased with pt reporting pain. Pt needed verbal cueing with therex which are also a part of her HEP. Pt tolerated the session without adverse effects, and will benefit from skilled PT to address impairments for improved functional use of UEs and to ensure proper completion for HEP.   OBJECTIVE IMPAIRMENTS decreased activity tolerance, decreased ROM, decreased strength, increased fascial restrictions, increased muscle spasms, impaired UE functional use, postural dysfunction, and pain.    ACTIVITY LIMITATIONS carrying, lifting, bending, sleeping, bed mobility, bathing, toileting, dressing, reach over head, hygiene/grooming, and caring for others   PARTICIPATION LIMITATIONS: meal prep, cleaning, laundry, driving, shopping, community activity, and yard work   PERSONAL  FACTORS Past/current experiences, Time since onset of injury/illness/exacerbation, and 3+ comorbidities:    DDD cervical, fibromyalgia, spinal stenosis cervical are also affecting patient's functional outcome.    REHAB POTENTIAL: Fair due to chronicity   CLINICAL DECISION MAKING: Evolving/moderate complexity   EVALUATION COMPLEXITY: Moderate     GOALS:     LONG TERM GOALS: Target date: 09/05/21    Pt's shoulder AROM will increase to R 105d and L 100d for improved shoulder function Baseline: about 90 deg today Status: Lt 110d, Rt 101d Goal status: ongoing    2.  Pt will demonstrate 4/5 shoulder strength for improved shoulder function Baseline: 3/5  STATUS: see flow sheet Goal status: MET   3.  Pt's FOTO score will improve to 64% as indication of improved function Baseline: 58% Goal status:ongoing    4.  Pt will be Ind in a HEP to maintain achieved LOF Baseline:  Status: Verbal cueing for proper completion  Goal status: ongoing    PLAN: PT FREQUENCY: 2x/week   PT DURATION: 6 weeks   PLANNED INTERVENTIONS: Therapeutic exercises, Therapeutic activity, Patient/Family education, Joint mobilization, Aquatic Therapy, Dry Needling, Cryotherapy, Moist heat, Taping, Ultrasound, Ionotophoresis 4mg /ml Dexamethasone, Manual therapy, and Re-evaluation   PLAN FOR NEXT SESSION:  ROM, as tolerated. Pulleys, table slides . use of modalities, manual therapy and TPDN as inidcated. [place] MS, PT 09/03/21 3:44 PM

## 2021-09-05 ENCOUNTER — Ambulatory Visit: Payer: Medicare Other | Admitting: Physical Therapy

## 2021-09-05 ENCOUNTER — Encounter: Payer: Self-pay | Admitting: Physical Therapy

## 2021-09-05 DIAGNOSIS — M25512 Pain in left shoulder: Secondary | ICD-10-CM | POA: Diagnosis not present

## 2021-09-05 DIAGNOSIS — G8929 Other chronic pain: Secondary | ICD-10-CM

## 2021-09-05 DIAGNOSIS — M6281 Muscle weakness (generalized): Secondary | ICD-10-CM

## 2021-09-05 DIAGNOSIS — M25611 Stiffness of right shoulder, not elsewhere classified: Secondary | ICD-10-CM

## 2021-09-05 DIAGNOSIS — M25612 Stiffness of left shoulder, not elsewhere classified: Secondary | ICD-10-CM

## 2021-09-05 NOTE — Therapy (Signed)
OUTPATIENT PHYSICAL THERAPY TREATMENT NOTE RENEWAL   Patient Name: Tina Watkins MRN: [id number] DOB:12-26-1965, 56 y.o., female Today's Date: 09/05/2021  PCP: Bo Merino I, NP REFERRING PROVIDER: Collier Salina, MD  END OF SESSION:   PT End of Session - 09/05/21 1155     Visit Number 7    Number of Visits 13    Date for PT Re-Evaluation 10/03/21    Authorization Type MEDICARE PART A AND B; MEDICAID [place] ACCESS    PT Start Time 1150    PT Stop Time 3536    PT Time Calculation (min) 45 min    Activity Tolerance Patient tolerated treatment well;Patient limited by pain    Behavior During Therapy [facility] for tasks assessed/performed                  Past Medical History:  Diagnosis Date   Bacteremia due to Gram-negative bacteria 05/23/2011   Blind    right eye   Blind right eye    CHF (congestive heart failure) ([place])    Chronic lower back pain    Complication of anesthesia    "woke up during OR; I have an extremely high tolerance" (12/11/2011) 1 procedure was graft; the other procedures were procedures that are typically done with sedation.   DDD (degenerative disc disease), cervical    Depression    Dysrhythmia    "tachycardia" (12/11/2011) new onset afib 10/15/14 EKG   E coli bacteremia 06/18/2011   Elevated LDL cholesterol level 12/2018   ESRD (end stage renal disease) ([place]) 06/12/2011   Fibromyalgia    Gastroparesis    Gastroparesis    GERD (gastroesophageal reflux disease)    Glaucoma    right eye   Gout    H/O carpal tunnel syndrome    Headache 10/2019   Headache(784.0)    "not often anymore" (12/11/2011)   Herpes genitalia 1994   History of blood transfusion    "more than a few times" (12/11/2011)   History of stomach ulcers    Hypotension    Iron deficiency anemia    New onset a-fib ([place])    10/15/14 EKG   Osteopenia    Peripheral neuropathy 11/2018   Pressure ulcer of sacral region, stage 1 07/2019   Seizures ([place]) 1994   "post  transplant; only have had that one" (12/11/2011)   Spinal stenosis in cervical region    Stroke [facility])     left basal ganglia lacunar infarct; Right frontal lobe lacunar infarct.   Stroke [facility]) ~ 1999; 2001   "briefly lost my vision; lost my right eye" (12/11/2011)   Vitamin D deficiency 12/2018   Past Surgical History:  Procedure Laterality Date   ANTERIOR CERVICAL DECOMP/DISCECTOMY FUSION N/A 01/08/2015   Procedure: Anterior Cervical Three-Four/Four-Five Decompression/Diskectomy/Fusion;  Surgeon: Leeroy Cha, MD;  Location: [place] [facility]UR[facility] ORS;  Service: Neurosurgery;  Laterality: N/A;  C3-4 C4-5 Anterior cervical decompression/diskectomy/fusion   APPENDECTOMY  ~ 2004   BIOPSY  09/12/2020   Procedure: BIOPSY;  Surgeon: Milus Banister, MD;  Location: [facility] ENDOSCOPY;  Service: Endoscopy;;   CATARACT EXTRACTION     right eye   COLONOSCOPY     COLONOSCOPY WITH PROPOFOL N/A 09/12/2020   Procedure: COLONOSCOPY WITH PROPOFOL;  Surgeon: Milus Banister, MD;  Location: [facility] ENDOSCOPY;  Service: Endoscopy;  Laterality: N/A;   ENUCLEATION  2001   "right"   ESOPHAGOGASTRODUODENOSCOPY (EGD) WITH PROPOFOL N/A 04/21/2012   Procedure: ESOPHAGOGASTRODUODENOSCOPY (EGD) WITH PROPOFOL;  Surgeon: Milus Banister, MD;  Location: [facility] ENDOSCOPY;  Service: Endoscopy;  Laterality: N/A;   ESOPHAGOGASTRODUODENOSCOPY (EGD) WITH PROPOFOL N/A 09/12/2020   Procedure: ESOPHAGOGASTRODUODENOSCOPY (EGD) WITH PROPOFOL;  Surgeon: Rachael Fee, MD;  Location: [facility] ENDOSCOPY;  Service: Endoscopy;  Laterality: N/A;   INSERTION OF DIALYSIS CATHETER  1988   "AV graft LUA & LFA; LUA worked for 1 day; LFA never worked"   IR FLUORO GUIDE CV LINE RIGHT  07/11/2019   IR FLUORO GUIDE CV LINE RIGHT  10/20/2019   IR FLUORO GUIDE CV LINE RIGHT  05/31/2020   IR PTA VENOUS EXCEPT DIALYSIS CIRCUIT  05/31/2020   IR RADIOLOGY PERIPHERAL GUIDED IV START  07/11/2019   IR US GUIDE VASC ACCESS RIGHT  07/11/2019   IR US GUIDE VASC ACCESS RIGHT  07/11/2019   IR US  GUIDE VASC ACCESS RIGHT  07/11/2019   IR VENOCAVAGRAM IVC  05/31/2020   KIDNEY TRANSPLANT  1994; 1999; 2005   "right"   MULTIPLE TOOTH EXTRACTIONS     POLYPECTOMY  09/12/2020   Procedure: POLYPECTOMY;  Surgeon: Rachael Fee, MD;  Location: [facility] ENDOSCOPY;  Service: Endoscopy;;   RIGHT HEART CATH N/A 03/23/2019   Procedure: RIGHT HEART CATH;  Surgeon: Dolores Patty, MD;  Location: [facility] INVASIVE CV LAB;  Service: Cardiovascular;  Laterality: N/A;   TONSILLECTOMY     TOTAL NEPHRECTOMY  1988?; 1994; 2005   Patient Active Problem List   Diagnosis Date Noted   Bilateral shoulder pain 06/06/2021   Bilateral hand swelling 06/06/2021   Cervical radiculitis 01/24/2021   Acute respiratory failure with hypoxia (HCC) 10/23/2020   Coagulation defect, unspecified (HCC) 08/01/2020   Aortic atherosclerosis (HCC) 07/10/2020   Secondary hypercoagulable state (HCC) 04/23/2020   Atrial flutter (HCC) 03/29/2020   Chronic pain of both knees 03/13/2020   Chills (without fever) 12/12/2019   Other pancytopenia (HCC)    Decubitus ulcer of sacral region, stage 1 08/18/2019   HCAP (healthcare-associated pneumonia) 08/05/2019   Pressure injury of skin 07/24/2019   Chest congestion 07/24/2019   Itching 07/24/2019   Weakness 06/13/2019   Pneumonia due to COVID-19 virus 05/09/2019   Hypotension 05/09/2019   Symptomatic anemia 05/09/2019   CHF (congestive heart failure) (HCC) 05/09/2019   Swollen abdomen 01/04/2019   DDD (degenerative disc disease), cervical 12/06/2018   Neuropathy 12/06/2018   Paresthesia 10/11/2018   Neck pain 10/11/2018   Tobacco abuse 11/20/2017   Right leg swelling 11/20/2017   Right leg pain 11/20/2017   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   GERD (gastroesophageal reflux disease) 11/20/2017   Acute venous embolism and thrombosis of deep vessels of proximal lower extremity, right (HCC) 11/20/2017   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     Thrush, oral    AKI (acute kidney injury) (HCC)    Multifocal pneumonia 08/09/2017   ETD (Eustachian tube dysfunction), bilateral 08/03/2017   Acute recurrent pansinusitis 07/21/2017   Chest pain 09/29/2015   Cervical stenosis of spinal canal 01/08/2015   Urinary tract infectious disease    Muscle spasms of neck 06/15/2014   Bleeding hemorrhoid 06/15/2014   Anemia in chronic kidney disease 06/15/2014   Pyelonephritis, acute 06/13/2014   Sepsis (HCC) 06/13/2014   History of kidney transplant    Abnormal CT scan 09/14/2012   Chronic pain syndrome 04/09/2012   Dehydration, mild 04/09/2012   Herpes infection 06/23/2011   Anxiety 06/23/2011   E coli bacteremia 06/18/2011   ESRD (end stage renal disease) (HCC) 06/12/2011  UTI (lower urinary tract infection) 06/12/2011   Bacteremia due to Gram-negative bacteria 05/23/2011   History of renal transplantation 05/22/2011   Septic shock(785.52) 05/22/2011   Acute on chronic kidney failure ([place]) 05/22/2011   Gastroparesis 04/24/2008   WEIGHT LOSS 08/24/2007   NAUSEA AND VOMITING 08/24/2007    REFERRING DIAG: M25.511,G89.29,M25.512 (ICD-10-CM) - Chronic pain of both shoulders  THERAPY DIAG:  Chronic left shoulder pain  Chronic right shoulder pain  Stiffness of left shoulder, not elsewhere classified  Stiffness of right shoulder, not elsewhere classified  Muscle weakness (generalized)  Rationale for Evaluation and Treatment Rehabilitation  SUBJECTIVE:                                                                                                                                                                                       SUBJECTIVE STATEMENT: It is feeling a bit better than last time. I want to keep coming to see if I can do more with my arms.      PERTINENT HISTORY DDD cervical, fibromyalgia, spinal stenosis cervical, CHF   PAIN:  Are you having pain? Yes: NPRS scale: 5/10 Pain location: Upper shoulders to [place] jt  to upper arms Pain description: ache and throb, dull , spasms  Aggravating factors: Just using my arms Relieving factors: Meds, massage , heat  Pain range: 5-9/10  PERTINENT HISTORY: DDD cervical, fibromyalgia, spinal stenosis cervical, CHF   PRECAUTIONS: None   PATIENT GOALS More strength and mobility of her  shoulders   DIAGNOSTIC FINDINGS:  X-ray right shoulder 4 views 06/20/21  Glenohumeral joint space appears normal.  Normal internal and external  rotation of humerus.  AC joint space appears normal.  No visible erosions  or abnormal calcifications seen.   Impression   Normal appearing shoulder x-ray   X-ray left shoulder 4 views 06/20/21  Glenohumeral joint space appears normal.  Normal internal and external  rotation of humerus.  AC joint space appears normal.  No visible erosions  or abnormal calcifications seen.   Impression   Normal appearing shoulder x-ray   PATIENT SURVEYS:  FOTO 58% predicted 64%   COGNITION:           Overall cognitive status: Within functional limits for tasks assessed                                  SENSATION: WFL   POSTURE: Forward head and rounded shoulders   UPPER EXTREMITY ROM:     AROM/AAROM/PROM Right eval Left eval Rt AROM 08/06/21 L AROM 08/06/21 R Arom 08/20/21 L Arom 08/20/21 R AROM  09/03/21 L ARO8/2/49M  Shoulder flexion 85/118/115 88/110/110 105 107 110 110 101 100  Shoulder extension            Shoulder abduction            Shoulder adduction            Shoulder internal rotation            Shoulder external rotation            Elbow flexion            Elbow extension            Wrist flexion            Wrist extension            Wrist ulnar deviation            Wrist radial deviation            Wrist pronation            Wrist supination            (Blank rows = not tested)   UPPER EXTREMITY MMT: Tested strength in a neutral position due to pain. Pt demonstrated strength against resistance seeming at  least 3/5. No overt weakness noted. MMT Right 7/17 Left 7/17 Rt 09/03/21 Lt 09/03/21  Shoulder flexion  2+  3- 3 3  Shoulder extension        Shoulder abduction     3 3  Shoulder adduction        Shoulder internal rotation  4-  $R'4 4 4  'ug$ Shoulder external rotation  3  3+ 3+ 3+  Middle trapezius        Lower trapezius        Elbow flexion        Elbow extension        Wrist flexion        Wrist extension        Wrist ulnar deviation        Wrist radial deviation        Wrist pronation        Wrist supination        Grip strength (lbs)        (Blank rows = not tested)   SHOULDER SPECIAL TESTS:            Impingement tests: Hawkins/Kennedy impingement test: positive             Rotator cuff assessment: Empty can test: positive  and Full can test: positive             Both tests were painful            Biceps assessment: Yergason's test: positive    PALPATION:  TTP to the upper trap and peri-GH area             TODAY'S TREATMENT:   [facility] Adult PT Treatment:                                                DATE: 09/05/21 Therapeutic Exercise: Pulleys standing facing anchor overhead NuStep UEs > LEs for 5 min  Supine cane chest press x 10  Overhead lift x 10  Manual Therapy: PROM all planes bilaterally with light distraction in various angles PROM  Flexion 115-120  Abduction  95 deg ER 40 deg IR 60 deg  Oscillation and GH mobs to R/L shoulder  Self care: POC, progress, FOTO score  [facility] Adult PT Treatment:                                                DATE: 09/03/21 Shoulder pulley flexion 2 mins Shoulder row 2x10 YTB Shoulder ext 2x10 YTB Pendulum flex and ext c 4# Table slides for UE flexion x 10, scaption  x 10 each Bilat shoulder ER YTB x10, min ROM  Supine chest press AAROM shoulder flexion  x 10, slow and painful  Manual Therapy: PROM all planes bilaterally with light distraction in various angles  [facility] Adult PT Treatment:                                                 DATE: 08/20/21 Therapeutic Exercise: Shoulder pulley flexion 2 mins Shoulder row 2x10 YTB Shoulder ext 2x10 YTB Pendulum flex and ext c 4# Table slides for UE flexion x 10, scaption  x 10 each Bilat shoulder ER YTB x10, min ROM  Supine chest press AAROM shoulder flexion  x 10, slow and painful  Manual Therapy: PROM all planes bilaterally with light distraction in various angles   Modalities 10 min bilateral shoulders MHP    [facility] Adult PT Treatment:                                                DATE: 08/18/21 Therapeutic Exercise: Supine chest press AAROM shoulder flexion  x 10, slow and painful  ER/IR with dowel unable to tolerate AAROM  Table slides for bilateral UE flexion x 10, scaption  x 10  ER /IR open chain in neutral x 10 Then added YTB x 10 with ER/IR, min ROM  Head rolls , shoulder rolls for reset between sets  Standing ball rolls on high mat table x 10  Ball press bilateral shoulders x 10 isometric extension   Manual Therapy: PROM all planes bilateral, light distraction in various angles   10 min bilateral shoulders MHP   PATIENT EDUCATION: Education details: HEP, POC,  Person educated: Patient Education method: Explanation, Demonstration, Tactile cues, Verbal cues, and Handouts Education comprehension: verbalized understanding, returned demonstration, verbal cues required, and tactile cues required     HOME EXERCISE PROGRAM: Access Code: [email or web address]   ASSESSMENT:   CLINICAL IMPRESSION: Patient has made slight progress with AROM of bilateral shoulders.  She continues to have significant difficulty with ADLs, unable to do her hair and lift to above shoulder height. Her strength and ROM is not fully functional.  She does feel like her pain is worse overall but PT has helped her become more functional. She will see Dr. Benjamine Mola at the end of the month who will determine next steps for PT. She may need more imaging or further diagnostics.   OBJECTIVE IMPAIRMENTS  decreased activity tolerance, decreased ROM, decreased strength, increased fascial restrictions, increased muscle spasms, impaired UE functional use, postural dysfunction, and pain.    ACTIVITY LIMITATIONS carrying, lifting, bending, sleeping, bed mobility, bathing,  toileting, dressing, reach over head, hygiene/grooming, and caring for others   PARTICIPATION LIMITATIONS: meal prep, cleaning, laundry, driving, shopping, community activity, and yard work   PERSONAL FACTORS Past/current experiences, Time since onset of injury/illness/exacerbation, and 3+ comorbidities:    DDD cervical, fibromyalgia, spinal stenosis cervical are also affecting patient's functional outcome.    REHAB POTENTIAL: Fair due to chronicity   CLINICAL DECISION MAKING: Evolving/moderate complexity   EVALUATION COMPLEXITY: Moderate     GOALS:     LONG TERM GOALS: Target date: 09/05/21    Pt's shoulder AROM will increase to R 105d and L 100d for improved shoulder function Baseline: about 90 deg today Status: Lt 110 deg, Rt 116 deg Goal status: MET   2.  Pt will demonstrate 4/5 shoulder strength for improved shoulder function Baseline: 3/5 to 3+/5    STATUS: see flow sheet Goal status: MET   3.  Pt's FOTO score will improve to 64% as indication of improved function Baseline: 48% now 55% on Re-assess Goal status:ongoing    4.  Pt will be Ind in a HEP to maintain achieved LOF Baseline:  Status: Verbal cueing for proper completion  Goal status: ongoing    PLAN: PT FREQUENCY: 2x/week   PT DURATION: 3 weeks   PLANNED INTERVENTIONS: Therapeutic exercises, Therapeutic activity, Patient/Family education, Joint mobilization, Aquatic Therapy, Dry Needling, Cryotherapy, Moist heat, Taping, Ultrasound, Ionotophoresis 4mg /ml Dexamethasone, Manual therapy, and Re-evaluation   PLAN FOR NEXT SESSION:  ROM, as tolerated. Pulleys, table slides . use of modalities, manual therapy and TPDN as inidcated. Reassess  Saralyn Pilar, [place] 09/05/21 12:43 PM Phone: [phone] Fax: [phone]

## 2021-09-06 ENCOUNTER — Observation Stay (HOSPITAL_COMMUNITY)
Admission: EM | Admit: 2021-09-06 | Discharge: 2021-09-07 | Disposition: A | Discharge status: Home / Self Care | Payer: Medicare Other | Attending: Internal Medicine | Admitting: Internal Medicine

## 2021-09-06 ENCOUNTER — Encounter (HOSPITAL_COMMUNITY): Payer: Self-pay | Admitting: *Deleted

## 2021-09-06 ENCOUNTER — Emergency Department (HOSPITAL_COMMUNITY): Payer: Medicare Other

## 2021-09-06 ENCOUNTER — Other Ambulatory Visit: Payer: Self-pay

## 2021-09-06 DIAGNOSIS — N186 End stage renal disease: Secondary | ICD-10-CM | POA: Diagnosis not present

## 2021-09-06 DIAGNOSIS — R9431 Abnormal electrocardiogram [ECG] [EKG]: Secondary | ICD-10-CM | POA: Insufficient documentation

## 2021-09-06 DIAGNOSIS — E1122 Type 2 diabetes mellitus with diabetic chronic kidney disease: Secondary | ICD-10-CM | POA: Diagnosis not present

## 2021-09-06 DIAGNOSIS — I4891 Unspecified atrial fibrillation: Secondary | ICD-10-CM | POA: Diagnosis not present

## 2021-09-06 DIAGNOSIS — I132 Hypertensive heart and chronic kidney disease with heart failure and with stage 5 chronic kidney disease, or end stage renal disease: Secondary | ICD-10-CM | POA: Insufficient documentation

## 2021-09-06 DIAGNOSIS — Z940 Kidney transplant status: Secondary | ICD-10-CM | POA: Insufficient documentation

## 2021-09-06 DIAGNOSIS — I959 Hypotension, unspecified: Secondary | ICD-10-CM | POA: Diagnosis not present

## 2021-09-06 DIAGNOSIS — Z79899 Other long term (current) drug therapy: Secondary | ICD-10-CM | POA: Insufficient documentation

## 2021-09-06 DIAGNOSIS — R112 Nausea with vomiting, unspecified: Secondary | ICD-10-CM | POA: Diagnosis present

## 2021-09-06 DIAGNOSIS — I509 Heart failure, unspecified: Secondary | ICD-10-CM | POA: Insufficient documentation

## 2021-09-06 DIAGNOSIS — Z8673 Personal history of transient ischemic attack (TIA), and cerebral infarction without residual deficits: Secondary | ICD-10-CM | POA: Diagnosis not present

## 2021-09-06 DIAGNOSIS — K3184 Gastroparesis: Secondary | ICD-10-CM | POA: Diagnosis present

## 2021-09-06 DIAGNOSIS — E1143 Type 2 diabetes mellitus with diabetic autonomic (poly)neuropathy: Secondary | ICD-10-CM | POA: Insufficient documentation

## 2021-09-06 DIAGNOSIS — J449 Chronic obstructive pulmonary disease, unspecified: Secondary | ICD-10-CM | POA: Diagnosis not present

## 2021-09-06 DIAGNOSIS — F17210 Nicotine dependence, cigarettes, uncomplicated: Secondary | ICD-10-CM | POA: Insufficient documentation

## 2021-09-06 DIAGNOSIS — Z7901 Long term (current) use of anticoagulants: Secondary | ICD-10-CM | POA: Diagnosis not present

## 2021-09-06 DIAGNOSIS — Z992 Dependence on renal dialysis: Secondary | ICD-10-CM | POA: Diagnosis not present

## 2021-09-06 LAB — URINALYSIS, ROUTINE W REFLEX MICROSCOPIC
Bacteria, UA: NONE SEEN
Bilirubin Urine: NEGATIVE
Glucose, UA: NEGATIVE mg/dL
Ketones, ur: NEGATIVE mg/dL
Leukocytes,Ua: NEGATIVE
Nitrite: NEGATIVE
Protein, ur: 30 mg/dL — AB
Specific Gravity, Urine: 1.012 (ref 1.005–1.030)
pH: 5 (ref 5.0–8.0)

## 2021-09-06 LAB — COMPREHENSIVE METABOLIC PANEL
ALT: 20 U/L (ref 0–44)
AST: 33 U/L (ref 15–41)
Albumin: 4.2 g/dL (ref 3.5–5.0)
Alkaline Phosphatase: 135 U/L — ABNORMAL HIGH (ref 38–126)
Anion gap: 17 — ABNORMAL HIGH (ref 5–15)
BUN: 54 mg/dL — ABNORMAL HIGH (ref 6–20)
CO2: 18 mmol/L — ABNORMAL LOW (ref 22–32)
Calcium: 9.4 mg/dL (ref 8.9–10.3)
Chloride: 101 mmol/L (ref 98–111)
Creatinine, Ser: 7.39 mg/dL — ABNORMAL HIGH (ref 0.44–1.00)
GFR, Estimated: 6 mL/min — ABNORMAL LOW (ref >60)
Glucose, Bld: 91 mg/dL (ref 70–99)
Potassium: 4.1 mmol/L (ref 3.5–5.1)
Sodium: 136 mmol/L (ref 135–145)
Total Bilirubin: 0.7 mg/dL (ref 0.3–1.2)
Total Protein: 7 g/dL (ref 6.5–8.1)

## 2021-09-06 LAB — CBC
HCT: 40.9 % (ref 36.0–46.0)
Hemoglobin: 13.1 g/dL (ref 12.0–15.0)
MCH: 29 pg (ref 26.0–34.0)
MCHC: 32 g/dL (ref 30.0–36.0)
MCV: 90.5 fL (ref 80.0–100.0)
Platelets: 179 10*3/uL (ref 150–400)
RBC: 4.52 MIL/uL (ref 3.87–5.11)
RDW: 25.2 % — ABNORMAL HIGH (ref 11.5–15.5)
WBC: 4.8 10*3/uL (ref 4.0–10.5)
nRBC: 0 % (ref 0.0–0.2)

## 2021-09-06 LAB — I-STAT BETA HCG BLOOD, ED (MC, WL, AP ONLY): I-stat hCG, quantitative: <5 m[IU]/mL (ref <5)

## 2021-09-06 LAB — HEPATITIS B SURFACE ANTIBODY,QUALITATIVE: Hep B S Ab: REACTIVE — AB

## 2021-09-06 LAB — LIPASE, BLOOD: Lipase: 54 U/L — ABNORMAL HIGH (ref 11–51)

## 2021-09-06 LAB — I-STAT CHEM 8, ED
BUN: 83 mg/dL — ABNORMAL HIGH (ref 6–20)
Calcium, Ion: 0.93 mmol/L — ABNORMAL LOW (ref 1.15–1.40)
Chloride: 104 mmol/L (ref 98–111)
Creatinine, Ser: 8.4 mg/dL — ABNORMAL HIGH (ref 0.44–1.00)
Glucose, Bld: 90 mg/dL (ref 70–99)
HCT: 37 % (ref 36.0–46.0)
Hemoglobin: 12.6 g/dL (ref 12.0–15.0)
Potassium: 5.4 mmol/L — ABNORMAL HIGH (ref 3.5–5.1)
Sodium: 133 mmol/L — ABNORMAL LOW (ref 135–145)
TCO2: 22 mmol/L (ref 22–32)

## 2021-09-06 LAB — HEPATITIS C ANTIBODY: HCV Ab: NONREACTIVE

## 2021-09-06 LAB — MRSA NEXT GEN BY PCR, NASAL: MRSA by PCR Next Gen: NOT DETECTED

## 2021-09-06 LAB — HEPATITIS B SURFACE ANTIGEN: Hepatitis B Surface Ag: NONREACTIVE

## 2021-09-06 LAB — HEPATITIS B CORE ANTIBODY, TOTAL: Hep B Core Total Ab: NONREACTIVE

## 2021-09-06 MED ORDER — LACTATED RINGERS IV BOLUS
250.0000 mL | Freq: Once | INTRAVENOUS | Status: AC
  Administered 2021-09-06: 250 mL via INTRAVENOUS

## 2021-09-06 MED ORDER — ESCITALOPRAM OXALATE 20 MG PO TABS
20.0000 mg | ORAL_TABLET | Freq: Every day | ORAL | Status: DC
  Administered 2021-09-06 – 2021-09-07 (×2): 20 mg via ORAL
  Filled 2021-09-06: qty 2
  Filled 2021-09-06: qty 1

## 2021-09-06 MED ORDER — APIXABAN 2.5 MG PO TABS
2.5000 mg | ORAL_TABLET | Freq: Two times a day (BID) | ORAL | Status: DC
Start: 2021-09-06 — End: 2021-09-07
  Administered 2021-09-06 – 2021-09-07 (×2): 2.5 mg via ORAL
  Filled 2021-09-06 (×2): qty 1

## 2021-09-06 MED ORDER — SODIUM ZIRCONIUM CYCLOSILICATE 10 G PO PACK
10.0000 g | PACK | Freq: Once | ORAL | Status: AC
  Administered 2021-09-06: 10 g via ORAL
  Filled 2021-09-06: qty 1

## 2021-09-06 MED ORDER — LORAZEPAM 1 MG PO TABS
0.5000 mg | ORAL_TABLET | Freq: Once | ORAL | Status: AC
  Administered 2021-09-06: 0.5 mg via ORAL
  Filled 2021-09-06: qty 1

## 2021-09-06 MED ORDER — METOCLOPRAMIDE HCL 5 MG/ML IJ SOLN
10.0000 mg | Freq: Four times a day (QID) | INTRAMUSCULAR | Status: DC | PRN
  Administered 2021-09-06: 10 mg via INTRAVENOUS
  Filled 2021-09-06: qty 2

## 2021-09-06 MED ORDER — MOMETASONE FURO-FORMOTEROL FUM 100-5 MCG/ACT IN AERO
2.0000 | INHALATION_SPRAY | Freq: Two times a day (BID) | RESPIRATORY_TRACT | Status: DC
Start: 2021-09-06 — End: 2021-09-07
  Administered 2021-09-06 – 2021-09-07 (×2): 2 via RESPIRATORY_TRACT
  Filled 2021-09-06: qty 8.8

## 2021-09-06 MED ORDER — CHLORHEXIDINE GLUCONATE CLOTH 2 % EX PADS
6.0000 | MEDICATED_PAD | Freq: Every day | CUTANEOUS | Status: DC
  Administered 2021-09-07: 6 via TOPICAL

## 2021-09-06 MED ORDER — ALLOPURINOL 100 MG PO TABS
100.0000 mg | ORAL_TABLET | Freq: Every day | ORAL | Status: DC
  Administered 2021-09-07: 100 mg via ORAL
  Filled 2021-09-06: qty 1

## 2021-09-06 MED ORDER — BUSPIRONE HCL 5 MG PO TABS
5.0000 mg | ORAL_TABLET | Freq: Two times a day (BID) | ORAL | Status: DC
  Administered 2021-09-06 – 2021-09-07 (×2): 5 mg via ORAL
  Filled 2021-09-06: qty 0.5
  Filled 2021-09-06: qty 1

## 2021-09-06 MED ORDER — ACETAMINOPHEN 500 MG PO TABS
1000.0000 mg | ORAL_TABLET | Freq: Three times a day (TID) | ORAL | Status: DC
  Administered 2021-09-06 – 2021-09-07 (×2): 1000 mg via ORAL
  Filled 2021-09-06 (×2): qty 2

## 2021-09-06 MED ORDER — LATANOPROST 0.005 % OP SOLN
1.0000 [drp] | Freq: Every day | OPHTHALMIC | Status: DC
  Administered 2021-09-07: 1 [drp] via OPHTHALMIC
  Filled 2021-09-06: qty 2.5

## 2021-09-06 MED ORDER — PANTOPRAZOLE SODIUM 40 MG PO TBEC
40.0000 mg | DELAYED_RELEASE_TABLET | Freq: Every day | ORAL | Status: DC
  Administered 2021-09-06 – 2021-09-07 (×2): 40 mg via ORAL
  Filled 2021-09-06 (×2): qty 1

## 2021-09-06 MED ORDER — ALBUMIN HUMAN 25 % IV SOLN
INTRAVENOUS | Status: AC
  Administered 2021-09-07: 50 g
  Filled 2021-09-06: qty 100

## 2021-09-06 MED ORDER — HYDROMORPHONE HCL 1 MG/ML IJ SOLN
1.0000 mg | Freq: Once | INTRAMUSCULAR | Status: AC
  Administered 2021-09-06: 1 mg via INTRAVENOUS
  Filled 2021-09-06: qty 1

## 2021-09-06 MED ORDER — DICLOFENAC SODIUM 1 % EX GEL: 2.0000 g | Freq: Four times a day (QID) | CUTANEOUS | Status: DC | PRN

## 2021-09-06 MED ORDER — MIDODRINE HCL 5 MG PO TABS
15.0000 mg | ORAL_TABLET | Freq: Three times a day (TID) | ORAL | Status: DC
  Administered 2021-09-06 – 2021-09-07 (×4): 15 mg via ORAL
  Filled 2021-09-06 (×4): qty 3

## 2021-09-06 MED ORDER — CALCIUM ACETATE (PHOS BINDER) 667 MG PO CAPS
667.0000 mg | ORAL_CAPSULE | Freq: Three times a day (TID) | ORAL | Status: DC
  Administered 2021-09-07 (×2): 667 mg via ORAL
  Filled 2021-09-06 (×2): qty 1

## 2021-09-06 MED ORDER — ZOLPIDEM TARTRATE 5 MG PO TABS: 10.0000 mg | ORAL_TABLET | Freq: Every evening | ORAL | Status: DC | PRN

## 2021-09-06 MED ORDER — AMIODARONE HCL 200 MG PO TABS
200.0000 mg | ORAL_TABLET | Freq: Every day | ORAL | Status: DC
  Administered 2021-09-06 – 2021-09-07 (×2): 200 mg via ORAL
  Filled 2021-09-06 (×2): qty 1

## 2021-09-06 MED ORDER — GABAPENTIN 100 MG PO CAPS
100.0000 mg | ORAL_CAPSULE | Freq: Two times a day (BID) | ORAL | Status: DC
Start: 2021-09-06 — End: 2021-09-07
  Administered 2021-09-07: 100 mg via ORAL
  Filled 2021-09-06: qty 1

## 2021-09-06 MED ORDER — METOCLOPRAMIDE HCL 5 MG/ML IJ SOLN
10.0000 mg | Freq: Once | INTRAMUSCULAR | Status: AC
Start: 2021-09-06 — End: 2021-09-06
  Administered 2021-09-06: 10 mg via INTRAVENOUS
  Filled 2021-09-06: qty 2

## 2021-09-06 MED ORDER — HYDROMORPHONE HCL 2 MG PO TABS
4.0000 mg | ORAL_TABLET | ORAL | Status: DC | PRN
  Administered 2021-09-06 – 2021-09-07 (×2): 4 mg via ORAL
  Filled 2021-09-06 (×2): qty 2

## 2021-09-06 MED ORDER — FAMOTIDINE 20 MG PO TABS
20.0000 mg | ORAL_TABLET | Freq: Every day | ORAL | Status: DC
Start: 2021-09-06 — End: 2021-09-07
  Administered 2021-09-06 – 2021-09-07 (×2): 20 mg via ORAL
  Filled 2021-09-06 (×2): qty 1

## 2021-09-06 MED ORDER — ONDANSETRON 4 MG PO TBDP
4.0000 mg | ORAL_TABLET | Freq: Once | ORAL | Status: AC | PRN
  Administered 2021-09-06: 4 mg via ORAL
  Filled 2021-09-06: qty 1

## 2021-09-06 MED ORDER — HYDROXYZINE HCL 25 MG PO TABS
25.0000 mg | ORAL_TABLET | Freq: Once | ORAL | Status: AC
  Administered 2021-09-06: 25 mg via ORAL
  Filled 2021-09-06: qty 1

## 2021-09-06 NOTE — Plan of Care (Signed)
  Problem: Activity: Goal: Risk for activity intolerance will decrease Outcome: Progressing   Problem: Elimination: Goal: Will not experience complications related to urinary retention Outcome: Progressing   

## 2021-09-06 NOTE — Hospital Course (Addendum)
N/v, abdominal pain Tried zofran '4mg'$  x2, '8mg'$  x1 Chills, no fevers No constipation/diarrhea  Legs swelling up, haven't had dialysis since Tuesday  No change in stools, no dysuria or frequency (makes very little urine)  Headache - right temple above eye  Took benadryl and ambien for sleep  Didn't get any dialysis this morning - due to vomiting and trembling/rocking, trying to relax herself, very uncomfortable  Dr. Ardis Hughs, GI - inflamed stomach lining   1 ppd since 2015, quit 5 years prior to that, then prior to that since 56yo Social alcohol use Marijuana use, every once in awhile for appetite (weekly)  50.3kg dw  Per CareEverywhere: Ms. Birkland is a 56 year old African-American female patient with a history of ESRD secondary to FSGS.  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 She had a second [facility] done at [facility] in the 2000 that failed within a week. Details not clear. She then received a left living donor kidney transplantation August 13, 2003 at the [facility]. She was lost to follow-up at [place] due to no contact. She developed allograft failure during an episode of COVID-pneumonia. She has been on dialysis since then. She still makes some urine. She is currently on prednisone 1 mg once a day which is slowly being tapered off.  She was deemed not a candidate at [facility] on December 15, 2019 due to patient having intermittent hospitalizations and being at the SNF. He also continues to smoke. She was deemed not a candidate at [facility] December 20, 2019. Reasons not clear. She was deemed not a candidate at [facility] due to chronic hypotension and being on high-dose midodrine.  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 female p/w n/v  *  Gastroparesis: Intractable n/v, states it feels similar to previous episodes. Does not have hx of diabetes, previously followed by GI. Does have prolonged QT, will give Reglan tonight and repeat ECG in the morning. She wanted to try full liquid diet this evening, order placed.  *ESRD: Missed Thursday's dialysis appointment due to not feeling well. Planning for dialysis this evening. Not hypoxic.   Consults: nephrology  Meds: Eliquis, amio, reglan, dilaudid VTE ppx: Eliquis IVF: n/a Diet: FLD

## 2021-09-06 NOTE — ED Notes (Signed)
Patient transported to CT 

## 2021-09-06 NOTE — Progress Notes (Signed)
NEW ADMISSION NOTE New Admission Note:   Arrival Method: stretcher Mental Orientation: A&LO x4 Telemetry: no Assessment: Completed Skin: intact IV:  Pain: states 10 Tubes: none present Safety Measures: Safety Fall Prevention Plan has been given, discussed and signed Admission: Completed [facility] Orientation: Patient has been orientated to the room, unit and staff.  Family: none  Orders have been reviewed and implemented. Will continue to monitor the patient. Call light has been placed within reach and bed alarm has been activated.   Berneta Levins, RN

## 2021-09-06 NOTE — ED Provider Notes (Signed)
[facility] EMERGENCY DEPARTMENT Provider Note   CSN: [id number] Arrival date & time: 09/06/21  8786     History  Chief Complaint  Patient presents with   Abdominal Pain    Tina Watkins is a 56 y.o. female.   Abdominal Pain   56 year old female with medical history significant for ESRD, GERD, gastroparesis who presents to the emergency department with a gastroparesis flare.  The patient states that she normally gets dialysis on Tuesday, Thursday, Saturday.  She last went to dialysis on Tuesday.  She missed her Thursday session.  She was at dialysis today but had severe nausea and vomiting and epigastric abdominal discomfort consistent with prior gastroparesis flares and so she was sent to the emergency department for further evaluation and management.  Home Medications Prior to Admission medications   Medication Sig Start Date End Date Taking? Authorizing Provider  acetaminophen (TYLENOL) 650 MG CR tablet Take 1,300 mg by mouth every 8 (eight) hours as needed for pain.   Yes [provider]  allopurinol (ZYLOPRIM) 100 MG tablet TAKE 1 TABLET BY MOUTH EVERY DAY Patient taking differently: Take 100 mg by mouth daily. 04/09/21  Yes Vevelyn Francois, NP  amiodarone (PACERONE) 200 MG tablet Take 1 tablet (200 mg total) by mouth daily. 05/07/21  Yes Milford, Maricela Bo, FNP  apixaban (ELIQUIS) 2.5 MG TABS tablet Take 1 tablet (2.5 mg total) by mouth 2 (two) times daily. 05/07/21  Yes Milford, Maricela Bo, FNP  B Complex-C-Zn-Folic Acid (DIALYVITE 767-MCNO 15) 0.8 MG TABS Take 0.8 mg by mouth daily. 12/30/20  Yes [provider]  busPIRone (BUSPAR) 10 MG tablet Take 5 mg by mouth 2 (two) times daily. 03/29/20  Yes [provider]  calcium acetate (PHOSLO) 667 MG capsule Take 667 mg by mouth 3 (three) times daily with meals. 07/25/20  Yes [provider]  Colchicine 0.6 MG CAPS Take 0.6 mg by mouth daily as needed (gout flare up).   Yes  [provider]  diclofenac Sodium (VOLTAREN) 1 % GEL APPLY 2 GRAMS TO AFFECTED AREA 4 TIMES A DAY Patient taking differently: Apply 2 g topically 4 (four) times daily. 03/24/21  Yes Aundra Dubin, PA-C  diphenhydrAMINE (BENADRYL) 25 mg capsule Take 1 capsule (25 mg total) by mouth every 8 (eight) hours as needed. Patient taking differently: Take 25 mg by mouth every 8 (eight) hours as needed for itching. 07/21/19  Yes [name], FNP  escitalopram (LEXAPRO) 20 MG tablet Take 20 mg by mouth daily. 03/21/21  Yes [provider]  famotidine (PEPCID) 20 MG tablet Take 20 mg by mouth 2 (two) times daily. 06/15/21  Yes [provider]  fluticasone (FLONASE) 50 MCG/ACT nasal spray Place 1 spray into both nostrils daily as needed for allergies or rhinitis.   Yes [provider]  fluticasone-salmeterol (ADVAIR HFA) 115-21 MCG/ACT inhaler Inhale 2 puffs into the lungs 2 (two) times daily. 04/28/21  Yes Freddi Starr, MD  gabapentin (NEURONTIN) 100 MG capsule TAKE 1 CAPSULE BY MOUTH TWICE A DAY MAX 2 CAPSULES DAILY Patient taking differently: Take 100 mg by mouth 2 (two) times daily. 06/19/21  Yes Passmore, Jake Church I, NP  latanoprost (XALATAN) 0.005 % ophthalmic solution Place 1 drop into the left eye at bedtime. 03/30/17  Yes [provider]  lidocaine (LIDODERM) 5 % Place 1 patch onto the skin daily as needed (for pain- remove old patch first).  10/28/18  Yes [provider]  Methoxy  PEG-Epoetin Beta (MIRCERA IJ) Inject 1 Dose as directed See admin instructions. Unknown to pt 03/06/21 03/05/22 Yes [provider]  metoCLOPramide (REGLAN) 10 MG tablet Take 1 tablet (10 mg total) by mouth every 8 (eight) hours as needed for nausea. 10/23/20 10/23/21 Yes King, Diona Foley, NP  metoCLOPramide (REGLAN) 5 MG tablet TAKE 1 TABLET (5 MG TOTAL) BY MOUTH 2 (TWO) TIMES DAILY BEFORE A MEAL. 04/24/21  Yes Milus Banister, MD  midodrine (PROAMATINE) 10 MG tablet  Take 1.5 tablets (15 mg total) by mouth 3 (three) times daily. 05/07/21  Yes Milford, Maricela Bo, FNP  omeprazole (PRILOSEC) 40 MG capsule TAKE 1 CAPSULE (40 MG TOTAL) BY MOUTH DAILY. 01/28/21  Yes Milus Banister, MD  ondansetron (ZOFRAN-ODT) 4 MG disintegrating tablet Take 1 tablet (4 mg total) by mouth every 8 (eight) hours as needed for nausea or vomiting. 01/24/21  Yes Vevelyn Francois, NP  oxyCODONE-acetaminophen (PERCOCET) 10-325 MG tablet Take 1 tablet by mouth 4 (four) times daily as needed for pain. 09/08/19  Yes [provider]  Vitamin D, Ergocalciferol, (DRISDOL) 1.25 MG (50000 UNIT) CAPS capsule TAKE 1 CAPSULE (50,000 UNITS TOTAL) BY MOUTH EVERY 7 (SEVEN) DAYS Patient taking differently: Take 50,000 Units by mouth every 7 (seven) days. 04/09/21  Yes Vevelyn Francois, NP  zolpidem (AMBIEN) 10 MG tablet Take 1 tablet (10 mg total) by mouth at bedtime as needed for sleep. 08/24/19  Yes [name], Pratik D, DO      Allergies    Levofloxacin and Tape    Review of Systems   Review of Systems  Gastrointestinal:  Positive for abdominal pain.    Physical Exam Updated Vital Signs BP 107/66   Pulse 65   Temp 97.7 F (36.5 C) (Oral)   Resp 18   Ht '4\' 11"'$  (1.499 m)   Wt 52.6 kg   SpO2 96%   BMI 23.43 kg/m  Physical Exam Vitals and nursing note reviewed.  Constitutional:      General: She is not in acute distress.    Appearance: She is well-developed.  HENT:     Head: Normocephalic and atraumatic.  Eyes:     Conjunctiva/sclera: Conjunctivae normal.  Cardiovascular:     Rate and Rhythm: Normal rate and regular rhythm.  Pulmonary:     Effort: Pulmonary effort is normal. No respiratory distress.     Breath sounds: Normal breath sounds.  Abdominal:     Palpations: Abdomen is soft.     Tenderness: There is abdominal tenderness in the left upper quadrant.  Musculoskeletal:        General: No swelling.     Cervical back: Neck supple.  Skin:    General: Skin is warm and dry.      Capillary Refill: Capillary refill takes less than 2 seconds.  Neurological:     Mental Status: She is alert.  Psychiatric:        Mood and Affect: Mood normal.     ED Results / Procedures / Treatments   Labs (all labs ordered are listed, but only abnormal results are displayed) Labs Reviewed  LIPASE, BLOOD - Abnormal; Notable for the following components:      Result Value   Lipase 54 (*)    All other components within normal limits  COMPREHENSIVE METABOLIC PANEL - Abnormal; Notable for the following components:   CO2 18 (*)    BUN 54 (*)    Creatinine, Ser 7.39 (*)    Alkaline Phosphatase 135 (*)  GFR, Estimated 6 (*)    Anion gap 17 (*)    All other components within normal limits  CBC - Abnormal; Notable for the following components:   RDW 25.2 (*)    All other components within normal limits  URINALYSIS, ROUTINE W REFLEX MICROSCOPIC - Abnormal; Notable for the following components:   Hgb urine dipstick MODERATE (*)    Protein, ur 30 (*)    All other components within normal limits  I-STAT CHEM 8, ED - Abnormal; Notable for the following components:   Sodium 133 (*)    Potassium 5.4 (*)    BUN 83 (*)    Creatinine, Ser 8.40 (*)    Calcium, Ion 0.93 (*)    All other components within normal limits  HEPATITIS B SURFACE ANTIGEN  HEPATITIS B SURFACE ANTIBODY,QUALITATIVE  HEPATITIS B SURFACE ANTIBODY, QUANTITATIVE  HEPATITIS B CORE ANTIBODY, TOTAL  HEPATITIS C ANTIBODY  BASIC METABOLIC PANEL  CBC  I-STAT BETA HCG BLOOD, ED ([facility], [facility], [facility] ONLY)    EKG EKG Interpretation  Date/Time:  Saturday September 06 2021 08:06:27 EDT Ventricular Rate:  85 PR Interval:  194 QRS Duration: 70 QT Interval:  434 QTC Calculation: 516 R Axis:   82 Text Interpretation: Normal sinus rhythm Possible Lateral infarct , age undetermined Prolonged QT Abnormal ECG When compared with ECG of 07-May-2021 14:10, PREVIOUS ECG IS PRESENT Confirmed by Regan Lemming ([id number]) on 09/06/2021 9:16:43  AM  Radiology CT ABDOMEN PELVIS WO CONTRAST  Result Date: 09/06/2021 CLINICAL DATA:  Abdominal pain, acute nonlocalized. Patient woke with stomach pain and nausea. Hemodialysis patient. EXAM: CT ABDOMEN AND PELVIS WITHOUT CONTRAST TECHNIQUE: Multidetector CT imaging of the abdomen and pelvis was performed following the standard protocol without IV contrast. RADIATION DOSE REDUCTION: This exam was performed according to the departmental dose-optimization program which includes automated exposure control, adjustment of the mA and/or kV according to patient size and/or use of iterative reconstruction technique. COMPARISON:  Prior CTs 03/30/2021 FINDINGS: Lower chest: Interval improved aeration of the lung bases with chronic right basilar scarring. No significant pleural or pericardial effusion. Chronic dilatation of the azygous/hemi azygous veins with prominent venous collaterals along the right heart border. Hepatobiliary: Stable mild contour irregularity of the right lobe inferiorly. No focal hepatic abnormality on noncontrast imaging. No evidence of gallstones, gallbladder wall thickening or biliary dilatation. Pancreas: Unremarkable. No pancreatic ductal dilatation or surrounding inflammatory changes. Spleen: The spleen appears stable at the upper limits of normal. No focal abnormality identified. Adrenals/Urinary Tract: No evidence of adrenal mass. The right adrenal gland appears normal. Left adrenal gland not well seen, possibly previously resected. The native kidneys have been previously resected. Transplant kidney in the right iliac fossa appears unchanged with chronic cortical thinning, but no hydronephrosis or perinephric fluid collection. The bladder appears unremarkable for its degree of distention. Stomach/Bowel: No enteric contrast administered. The stomach appears unremarkable for its degree of distension. No evidence of bowel wall thickening, distention or surrounding inflammatory change.  Vascular/Lymphatic: Right femoral vein hemodialysis catheter is unchanged within the IVC inferior to the renal veins. Numerous abdominal wall, mesenteric and retroperitoneal venous collaterals are noted. There is diffuse aortic and branch vessel atherosclerosis without evidence of aneurysm. Allowing for the retroperitoneal collaterals and lack of contrast, no enlarged lymph nodes are identified. Reproductive: The uterus and ovaries appear unremarkable. No adnexal mass. Other: Chronic dense calcification anterior to the right iliac vessels measuring 2.4 cm on image 53/7. Postsurgical changes in the intra-abdominal wall. No ascites. Musculoskeletal: No  acute or significant osseous findings. 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 Generalized osseous demineralization. IMPRESSION: 1. No acute abdominopelvic findings identified. 2. Stable appearance of the transplant kidney in the right iliac fossa. 3. Numerous abdominal wall, mesenteric and retroperitoneal venous collaterals are grossly stable allowing for noncontrast technique. Diffuse aortic and branch vessel atherosclerosis. Aortic Atherosclerosis (ICD10-I70.0). Electronically Signed   By: Richardean Sale M.D.   On: 09/06/2021 11:03   DG Chest Portable 1 View  Result Date: 09/06/2021 CLINICAL DATA:  End stage renal disease.  Abdominal pain. EXAM: PORTABLE CHEST 1 VIEW COMPARISON:  03/30/2021 FINDINGS: There is a stent in the expected location of the SVC. Stable cardiomediastinal contours. Chronic interstitial coarsening. Blunting of the right costophrenic angle is identified which may represent a small pleural effusion. No frank interstitial edema or airspace consolidation. IMPRESSION: 1. Suspect small right pleural effusion. 2. Chronic interstitial coarsening. Electronically Signed   By: Kerby Moors M.D.   On: 09/06/2021 10:12    Procedures Procedures    Medications Ordered in ED Medications  busPIRone (BUSPAR) tablet 5  mg (5 mg Oral Given 09/06/21 1650)  HYDROmorphone (DILAUDID) tablet 4 mg (4 mg Oral Given 09/06/21 1651)  midodrine (PROAMATINE) tablet 15 mg (15 mg Oral Given 09/06/21 1650)  zolpidem (AMBIEN) tablet 10 mg (has no administration in time range)  allopurinol (ZYLOPRIM) tablet 100 mg (has no administration in time range)  amiodarone (PACERONE) tablet 200 mg (200 mg Oral Given 09/06/21 1651)  apixaban (ELIQUIS) tablet 2.5 mg (2.5 mg Oral Given 09/06/21 1650)  escitalopram (LEXAPRO) tablet 20 mg (20 mg Oral Given 09/06/21 1650)  pantoprazole (PROTONIX) EC tablet 40 mg (40 mg Oral Given 09/06/21 1651)  gabapentin (NEURONTIN) capsule 100 mg (has no administration in time range)  acetaminophen (TYLENOL) tablet 1,000 mg (1,000 mg Oral Given 09/06/21 1650)  Chlorhexidine Gluconate Cloth 2 % PADS 6 each (has no administration in time range)  famotidine (PEPCID) tablet 20 mg (20 mg Oral Given 09/06/21 1651)  metoCLOPramide (REGLAN) injection 10 mg (10 mg Intravenous Given 09/06/21 1643)  ondansetron (ZOFRAN-ODT) disintegrating tablet 4 mg (4 mg Oral Given 09/06/21 0753)  LORazepam (ATIVAN) tablet 0.5 mg (0.5 mg Oral Given 09/06/21 0959)  lactated ringers bolus 250 mL (0 mLs Intravenous Stopped 09/06/21 1020)  sodium zirconium cyclosilicate (LOKELMA) packet 10 g (10 g Oral Given 09/06/21 0959)  metoCLOPramide (REGLAN) injection 10 mg (10 mg Intravenous Given 09/06/21 0957)  HYDROmorphone (DILAUDID) injection 1 mg (1 mg Intravenous Given 09/06/21 0958)  hydrOXYzine (ATARAX) tablet 25 mg (25 mg Oral Given 09/06/21 1242)  lactated ringers bolus 250 mL (250 mLs Intravenous New Bag/Given 09/06/21 1244)    ED Course/ Medical Decision Making/ A&P Clinical Course as of 09/06/21 1714  Sat Sep 06, 2021  0940 Potassium(!): 5.4 [JL]    Clinical Course User Index [JL] Regan Lemming, MD                           Medical Decision Making Amount and/or Complexity of Data Reviewed Labs: ordered. Decision-making details documented in ED  Course. Radiology: ordered. ECG/medicine tests: ordered.  Risk Prescription drug management. Decision regarding hospitalization.   56 year old female with medical history significant for ESRD, GERD, gastroparesis who presents to the emergency department with a gastroparesis flare.  The patient states that she normally gets dialysis on Tuesday, Thursday, Saturday.  She last went to dialysis on Tuesday.  She missed her Thursday session.  She was at  dialysis today but had severe nausea and vomiting and epigastric abdominal discomfort consistent with prior gastroparesis flares and so she was sent to the emergency department for further evaluation and management.  On arrival, the patient was vitally stable.  NSR noted on cardiac telemetry.  Presenting with left upper quadrant abdominal pain, nausea, vomiting in the setting of the patient's known history of gastroparesis.  Patient states this is consistent with prior gastroparesis flare.  Patient was administered IV fluids, P.o. Ativan, IV Reglan, IV Dilaudid with subsequent improvement but persistent symptoms.  Patient denies any chest pain.  No shortness of breath.  Laboratory evaluation significant for CHEM 8 with a potassium of 5.4, lipase 54, CBC without leukocytosis or anemia, urinalysis with moderate hemoglobin, negative for UTI, CMP with BUN 54, creatinine 7.39.  The patient was administered p.o. Lokelma.  CT abdomen pelvis without contrast was performed and revealed the following: IMPRESSION:  1. No acute abdominopelvic findings identified.  2. Stable appearance of the transplant kidney in the right iliac  fossa.  3. Numerous abdominal wall, mesenteric and retroperitoneal venous  collaterals are grossly stable allowing for noncontrast technique.  Diffuse aortic and branch vessel atherosclerosis. Aortic  Atherosclerosis (ICD10-I70.0).    The patient missed dialysis and has no scheduled dialysis until Tuesday and cannot get back into  dialysis today.  Given her ongoing flare of her gastroparesis, difficulty tolerating oral intake, medicine was consulted for admission for observation.  Nephrology was consulted for nonemergent dialysis while inpatient.  The patient was subsequent admitted in stable condition.   Final Clinical Impression(s) / ED Diagnoses Final diagnoses:  Prolonged Q-T interval on ECG  Gastroparesis due to DM ([place])  ESRD (end stage renal disease) ([place])    Rx / DC Orders ED Discharge Orders     None         Regan Lemming, MD 09/06/21 1715

## 2021-09-06 NOTE — ED Notes (Signed)
Pt BP is 88/58 currently; pt states this is normal for her.  MD Lawsing aware.  No new orders at this time.

## 2021-09-06 NOTE — H&P (Signed)
Date: 09/06/2021               Patient Name:  Tina Watkins MRN: [id number]  DOB: September 12, 1965 Age / Sex: 56 y.o., female   PCP: Teena Dunk, NP         Medical Service: Internal Medicine Teaching Service         Attending Physician: Dr. Velna Ochs, MD    First Contact: Dr. Claudia Desanctis Malic Rosten Pager: [phone]  Second Contact: Dr. Sanjuan Dame  Pager: [phone]       After Hours (After 5p/  First Contact Pager: [phone]  weekends / holidays): Second Contact Pager: [phone]   Chief Complaint: nausea and vomiting  History of Present Illness:  Tina Watkins is a 56 y/o female with history of ESRD on HD (TTS), previous renal transplant, GERD, unspecified gastroparesis, A-fib on eliquis, CHF, CVA, COPD (not on home O2) presented with nausea and vomiting that began earlier today during her dialysis session. Patient also developed chills, intermittent headache, and left-sided abdominal pain for the same duration.  Patient also endorses bilateral lower rib pain that she attributes to retching.  Patient states that she has been experiencing intermittent episodes of nausea and vomiting chronically and attributes this to her gastroparesis.  She states that today's episode feels similar to previous episodes of gastroparesis.  Patient states that she has been taking her home ondansetron, Zofran, and Reglan with only mild relief in her symptoms. Patient states that in the past her pain was controlled well with IV Dilaudid.  Her last BM was today and was normal. Patient is also complaining of chronic lower extremity swelling.  Patient states that she missed her dialysis session on 8/3 due to a similar episode of nausea vomiting.  She states that she was also unable to complete her dialysis session today due to a malfunction of her catheter.  Patient has a R femoral TDC for access.  Patient denies other recent complications with her dialysis catheter including infection.  Patient denies  noticing any redness, warmth, swelling around her catheter site.  Patient denies fever, constipation, diarrhea, hematochezia, melena, chest pain, frequency, dysuria, dizziness, lightheadedness.  Patient follows up with Dr. Ardis Hughs for GI.   ED Course: -Patient was given 10 mg of IV Reglan for nausea, 1 mg IV Dilaudid for pain, and 0.5 mg Ativan for anxiety.  -Patient's initial potassium was 4.1.  Repeat potassium was 5.4 and the patient was given 10 g of Lokelma.  -Chest x-ray demonstrated small right pleural effusion. -CT abdomen/pelvis negative for acute abdominal-pelvic pathology.  Meds:  Current Meds  Medication Sig   acetaminophen (TYLENOL) 650 MG CR tablet Take 1,300 mg by mouth every 8 (eight) hours as needed for pain.   allopurinol (ZYLOPRIM) 100 MG tablet TAKE 1 TABLET BY MOUTH EVERY DAY (Patient taking differently: Take 100 mg by mouth daily.)   amiodarone (PACERONE) 200 MG tablet Take 1 tablet (200 mg total) by mouth daily.   apixaban (ELIQUIS) 2.5 MG TABS tablet Take 1 tablet (2.5 mg total) by mouth 2 (two) times daily.   B Complex-C-Zn-Folic Acid (DIALYVITE 509-TOIZ 15) 0.8 MG TABS Take 0.8 mg by mouth daily.   busPIRone (BUSPAR) 10 MG tablet Take 5 mg by mouth 2 (two) times daily.   calcium acetate (PHOSLO) 667 MG capsule Take 667 mg by mouth 3 (three) times daily with meals.   Colchicine 0.6 MG CAPS Take 0.6 mg by mouth daily as needed (gout flare up).  diclofenac Sodium (VOLTAREN) 1 % GEL APPLY 2 GRAMS TO AFFECTED AREA 4 TIMES A DAY (Patient taking differently: Apply 2 g topically 4 (four) times daily.)   diphenhydrAMINE (BENADRYL) 25 mg capsule Take 1 capsule (25 mg total) by mouth every 8 (eight) hours as needed. (Patient taking differently: Take 25 mg by mouth every 8 (eight) hours as needed for itching.)   escitalopram (LEXAPRO) 20 MG tablet Take 20 mg by mouth daily.   famotidine (PEPCID) 20 MG tablet Take 20 mg by mouth 2 (two) times daily.   fluticasone (FLONASE) 50  MCG/ACT nasal spray Place 1 spray into both nostrils daily as needed for allergies or rhinitis.   fluticasone-salmeterol (ADVAIR HFA) 115-21 MCG/ACT inhaler Inhale 2 puffs into the lungs 2 (two) times daily.   gabapentin (NEURONTIN) 100 MG capsule TAKE 1 CAPSULE BY MOUTH TWICE A DAY MAX 2 CAPSULES DAILY (Patient taking differently: Take 100 mg by mouth 2 (two) times daily.)   latanoprost (XALATAN) 0.005 % ophthalmic solution Place 1 drop into the left eye at bedtime.   lidocaine (LIDODERM) 5 % Place 1 patch onto the skin daily as needed (for pain- remove old patch first).    Methoxy PEG-Epoetin Beta (MIRCERA IJ) Inject 1 Dose as directed See admin instructions. Unknown to pt   metoCLOPramide (REGLAN) 10 MG tablet Take 1 tablet (10 mg total) by mouth every 8 (eight) hours as needed for nausea.   metoCLOPramide (REGLAN) 5 MG tablet TAKE 1 TABLET (5 MG TOTAL) BY MOUTH 2 (TWO) TIMES DAILY BEFORE A MEAL.   midodrine (PROAMATINE) 10 MG tablet Take 1.5 tablets (15 mg total) by mouth 3 (three) times daily.   omeprazole (PRILOSEC) 40 MG capsule TAKE 1 CAPSULE (40 MG TOTAL) BY MOUTH DAILY.   ondansetron (ZOFRAN-ODT) 4 MG disintegrating tablet Take 1 tablet (4 mg total) by mouth every 8 (eight) hours as needed for nausea or vomiting.   oxyCODONE-acetaminophen (PERCOCET) 10-325 MG tablet Take 1 tablet by mouth 4 (four) times daily as needed for pain.   Vitamin D, Ergocalciferol, (DRISDOL) 1.25 MG (50000 UNIT) CAPS capsule TAKE 1 CAPSULE (50,000 UNITS TOTAL) BY MOUTH EVERY 7 (SEVEN) DAYS (Patient taking differently: Take 50,000 Units by mouth every 7 (seven) days.)   zolpidem (AMBIEN) 10 MG tablet Take 1 tablet (10 mg total) by mouth at bedtime as needed for sleep.     Allergies: Allergies as of 09/06/2021 - Review Complete 09/06/2021  Allergen Reaction Noted   Levofloxacin Itching and Rash 05/23/2011   Tape Other (See Comments) 03/20/2019   Past Medical History:  Diagnosis Date   Bacteremia due to  Gram-negative bacteria 05/23/2011   Blind    right eye   Blind right eye    CHF (congestive heart failure) (HCC)    Chronic lower back pain    Complication of anesthesia    "woke up during OR; I have an extremely high tolerance" (12/11/2011) 1 procedure was graft; the other procedures were procedures that are typically done with sedation.   DDD (degenerative disc disease), cervical    Depression    Dysrhythmia    "tachycardia" (12/11/2011) new onset afib 10/15/14 EKG   E coli bacteremia 06/18/2011   Elevated LDL cholesterol level 12/2018   ESRD (end stage renal disease) ([place]) 06/12/2011   Fibromyalgia    Gastroparesis    Gastroparesis    GERD (gastroesophageal reflux disease)    Glaucoma    right eye   Gout    H/O carpal tunnel syndrome  Headache 10/2019   Headache(784.0)    "not often anymore" (12/11/2011)   Herpes genitalia 1994   History of blood transfusion    "more than a few times" (12/11/2011)   History of stomach ulcers    Hypotension    Iron deficiency anemia    New onset a-fib ([place])    10/15/14 EKG   Osteopenia    Peripheral neuropathy 11/2018   Pressure ulcer of sacral region, stage 1 07/2019   Seizures ([place]) 1994   "post transplant; only have had that one" (12/11/2011)   Spinal stenosis in cervical region    Stroke [facility])     left basal ganglia lacunar infarct; Right frontal lobe lacunar infarct.   Stroke [facility]) ~ 1999; 2001   "briefly lost my vision; lost my right eye" (12/11/2011)   Vitamin D deficiency 12/2018    Family History: Uncle with diabetes.  Grandmother with hypertension.  Dad with prostate cancer.  Social History: Lives at home with boyfriend.  Has been smoking 0.5 packs/day since 2015.  Patient took eight 5-year break from 2010-2015 with smoking.  Prior to this the patient had been smoking 0.5 packs/day since age 80.  Endorses occasional alcohol use.  Endorses weekly marijuana use.  Denies other recreational drug use.  Surgical History: appendectomy  (2004), polypectomy (2022), anterior cervical decompression/discectomy fusion (2016).   Review of Systems: A complete ROS was negative except as per HPI.   Physical Exam: Blood pressure (!) 81/55, pulse 67, temperature 98.3 F (36.8 C), temperature source Oral, resp. rate 15, height '4\' 11"'$  (1.499 m), weight 52.6 kg, SpO2 98 %.  Constitutional:      General: She is not in acute distress.    Appearance: She is not ill-appearing.     Comments: Alert and oriented to person and place. Not actively vomiting.  HENT:     Head: Normocephalic.  Eyes:     General: No scleral icterus.       Right eye: No discharge.        Left eye: No discharge.     Conjunctiva/sclera: Conjunctivae normal.  Cardiovascular:     Rate and Rhythm: Normal rate and regular rhythm.     Pulses: Normal pulses.     Heart sounds: 2/5 Systolic murmur heard best at the left sternal border.     Comments: No LE edema Pulmonary:     Effort: Pulmonary effort is normal. No respiratory distress.     Breath sounds: Normal breath sounds. No wheezing.  Abdominal:     General: Bowel sounds are normal. Mild abdominal distension.  Tenderness to palpation of LUQ, RUQ, epigastric region.  No guarding. Skin:    General: Skin is warm.     Coloration: Skin is not jaundiced.  Neurological:     Mental Status: She is alert.    EKG: personally reviewed my interpretation is peaked T-waves in leads II and III, mildly prolonged QTc.  CXR: personally reviewed my interpretation is possible small right pleural effusion.   Assessment & Plan by Problem: Principal Problem:   Nausea & vomiting  Tina Watkins is a 56 y/o female with history of ESRD on HD (TTS), previous renal transplant, GERD, unspecified gastroparesis, A-fib on eliquis, CHF, CVA, COPD (not on home O2) presented with nausea, vomiting, and abdominal pain and admitted for management of likely gastroparesis and for completion of HD.   # Nausea/vomiting/abdominal pain likely  secondary to gastroparesis Upper endoscopy in 09/2020 demonstrates small hiatal hernia w/ associated Cameron's erosions, and  mild non-specific gastritis.  Colonoscopy in 09/2020 demonstrates one 3 mm polyp in the ascending colon resected and retrieved as well as internal hemorrhoids. CT abdomen/pelvis demonstrates no acute abdominopelvic findings. Patient's nausea and vomiting improved after receiving Reglan and Zofran in the emergency department.  Moderate improvement in the patient's abdominal pain after receiving 1 mg IV Dilaudid in the ED.  Patient given BuSpar and Ativan for anxiety in the ED. Will continue to treat her pain and n/v.  Will continue to monitor her QTc if Reglan is necessary to control her nausea and vomiting. - PO Dilaudid '4mg'$  PRN and PO tylenol 1000 mg TID for pain control - PO Pantoprazole 40 mg for nausea/vomiting   # ESRD on HD Potassium 4.1 on admission.  Repeat potassium 1 hour later was 5.4 suspicious for hemolyzed sample.  10 g of Lokelma was given in the ED.  Nephrology was consulted by the ED physician and will dialyze tonight. - Continue TTS dialysis regimen - Will recheck BMP  - Ordered hep B and hep C studies  3. # Hypotension Patient's blood pressure was 81/55 in the ED.  Improved to 107/66 after 500 mL LR bolus given in the ED. Patient states that low blood pressure is normal for her. - Will continue to monitor BP  4. # Chronic LE swelling History of LE swelling likely secondary to ESRD. No LE edema noted on exam.  - Will continue to monitor   5. # Atrial fibrillation Patient not currently in A-fib.  No tachycardia.  Will continue home medications: - Eliquis 2.5 mg BID - Amiodarone 200 mg  6. # GERD Could potentially be related to the patients nausea/vomiting on presentation. Will continue home medications: - Famotidine 20 mg  7. # Anxiety Patient appears mildly anxious on exam.  Will continue home medications: - Lexapro 20 mg - Buspar 5 mg BID  8.  # Chronic gout due to renal impairment Not currently complaining of any pain in her extremities.  Not in an acute gout flare.  Will continue home medications: - Allopurinol 100 mg   Diet: Thin liquid IVF: LR DVT: Eliquis Code: Full Code   Dispo: Admit patient to Observation with expected length of stay less than 2 midnights.  Signed: Starlyn Skeans, MD 09/06/2021, 1:58 PM  Pager: [phone] After 5pm on weekdays and 1pm on weekends: On Call pager: [phone]

## 2021-09-06 NOTE — ED Triage Notes (Signed)
Pt states she was awoken with stomach pain and nausea.  She attempted to have her dialysis, but her catheter wasn't working, so they sent her here.  Last dialysis was Tuesday (Pt states sick with same illness Thursday, but resolved until this am).  Presently dry heaving.

## 2021-09-06 NOTE — Consult Note (Signed)
[facility] Renal Consultation Note    Indication for Consultation:  Management of ESRD/hemodialysis, anemia, hypertension/volume, and secondary hyperparathyroidism. PCP:  HPI: Tina Watkins is a 56 y.o. female with ESRD on HD TTS, HTN, Hx CVA, severe gastroparesis who was admitted OBS status with intractable nausea/vomiting and abdominal pain.  Sent to ED via EMS on morning of 09/06/21. Woke with stomach pain and nausea/vomiting. Took home reglan/zofran without much relief. Presented to her usual dialysis, but was so uncomfortable that she could not sit still and her TDC/machine kept alarming, so it was decided that she should be evaluated in the ED. In ED, BP was ok. Afebrile and not hypoxic. Labs with Na 136, K 4.1 -> 5.4, Ca 9.4, WBC 4.8, Hgb 13.1. Abd CT with RLL atelectasis/scarring, dilated venous collaterals, nothing acute.   Seen in ED bed this afternoon. She appears comfortable, NAD. No CP, dyspnea. No vomiting at this moment.Requesting re-dosing of meds for pain and nausea, defer to RN. No LE edema.  Dialysis on TTS at [facility], minimal HD today as above. Uses R femoral TDC as her access.  Past Medical History:  Diagnosis Date   Bacteremia due to Gram-negative bacteria 05/23/2011   Blind    right eye   Blind right eye    CHF (congestive heart failure) (HCC)    Chronic lower back pain    Complication of anesthesia    "woke up during OR; I have an extremely high tolerance" (12/11/2011) 1 procedure was graft; the other procedures were procedures that are typically done with sedation.   DDD (degenerative disc disease), cervical    Depression    Dysrhythmia    "tachycardia" (12/11/2011) new onset afib 10/15/14 EKG   E coli bacteremia 06/18/2011   Elevated LDL cholesterol level 12/2018   ESRD (end stage renal disease) ([place]) 06/12/2011   Fibromyalgia    Gastroparesis    Gastroparesis    GERD (gastroesophageal reflux disease)    Glaucoma    right eye   Gout     H/O carpal tunnel syndrome    Headache 10/2019   Headache(784.0)    "not often anymore" (12/11/2011)   Herpes genitalia 1994   History of blood transfusion    "more than a few times" (12/11/2011)   History of stomach ulcers    Hypotension    Iron deficiency anemia    New onset a-fib ([place])    10/15/14 EKG   Osteopenia    Peripheral neuropathy 11/2018   Pressure ulcer of sacral region, stage 1 07/2019   Seizures ([employer]) 1994   "post transplant; only have had that one" (12/11/2011)   Spinal stenosis in cervical region    Stroke [facility])     left basal ganglia lacunar infarct; Right frontal lobe lacunar infarct.   Stroke [facility]) ~ 1999; 2001   "briefly lost my vision; lost my right eye" (12/11/2011)   Vitamin D deficiency 12/2018   Past Surgical History:  Procedure Laterality Date   ANTERIOR CERVICAL DECOMP/DISCECTOMY FUSION N/A 01/08/2015   Procedure: Anterior Cervical Three-Four/Four-Five Decompression/Diskectomy/Fusion;  Surgeon: Leeroy Cha, MD;  Location: [place] NEURO ORS;  Service: Neurosurgery;  Laterality: N/A;  C3-4 C4-5 Anterior cervical decompression/diskectomy/fusion   APPENDECTOMY  ~ 2004   BIOPSY  09/12/2020   Procedure: BIOPSY;  Surgeon: Milus Banister, MD;  Location: [facility] ENDOSCOPY;  Service: Endoscopy;;   CATARACT EXTRACTION     right eye   COLONOSCOPY     COLONOSCOPY WITH PROPOFOL N/A 09/12/2020  Procedure: COLONOSCOPY WITH PROPOFOL;  Surgeon: Milus Banister, MD;  Location: [facility] ENDOSCOPY;  Service: Endoscopy;  Laterality: N/A;   ENUCLEATION  2001   "right"   ESOPHAGOGASTRODUODENOSCOPY (EGD) WITH PROPOFOL N/A 04/21/2012   Procedure: ESOPHAGOGASTRODUODENOSCOPY (EGD) WITH PROPOFOL;  Surgeon: Milus Banister, MD;  Location: [facility] ENDOSCOPY;  Service: Endoscopy;  Laterality: N/A;   ESOPHAGOGASTRODUODENOSCOPY (EGD) WITH PROPOFOL N/A 09/12/2020   Procedure: ESOPHAGOGASTRODUODENOSCOPY (EGD) WITH PROPOFOL;  Surgeon: Milus Banister, MD;  Location: [facility] ENDOSCOPY;  Service: Endoscopy;   Laterality: N/A;   INSERTION OF DIALYSIS CATHETER  1988   "AV graft LUA & LFA; LUA worked for 1 day; LFA never worked"   IR FLUORO GUIDE CV LINE RIGHT  07/11/2019   IR FLUORO GUIDE CV LINE RIGHT  10/20/2019   IR FLUORO GUIDE CV LINE RIGHT  05/31/2020   IR PTA VENOUS EXCEPT DIALYSIS CIRCUIT  05/31/2020   IR RADIOLOGY PERIPHERAL GUIDED IV START  07/11/2019   IR US GUIDE VASC ACCESS RIGHT  07/11/2019   IR US GUIDE VASC ACCESS RIGHT  07/11/2019   IR US GUIDE VASC ACCESS RIGHT  07/11/2019   IR VENOCAVAGRAM IVC  05/31/2020   KIDNEY TRANSPLANT  1994; 1999; 2005   "right"   MULTIPLE TOOTH EXTRACTIONS     POLYPECTOMY  09/12/2020   Procedure: POLYPECTOMY;  Surgeon: Milus Banister, MD;  Location: [facility] ENDOSCOPY;  Service: Endoscopy;;   RIGHT HEART CATH N/A 03/23/2019   Procedure: RIGHT HEART CATH;  Surgeon: Jolaine Artist, MD;  Location: [place] CV LAB;  Service: Cardiovascular;  Laterality: N/A;   [place]?; 1994; 2005   Family History  Problem Relation Age of Onset   Glaucoma Mother    Pancreatic cancer Father    Multiple sclerosis Brother    Diabetes Maternal Uncle    Hypertension Maternal Grandmother    Breast cancer Neg Hx    Social History:  reports that she has been smoking cigarettes. She has a 11.55 pack-year smoking history. She has never been exposed to tobacco smoke. She has never used smokeless tobacco. She reports current alcohol use of about 1.0 standard drink of alcohol per week. She reports that she does not currently use drugs after having used the following drugs: Marijuana. Frequency: 1.00 time per week.  ROS: As per HPI otherwise negative.  Physical Exam: Vitals:   09/06/21 1000 09/06/21 1058 09/06/21 1200 09/06/21 1322  BP: 122/70 101/63 (!) 88/58 (!) 81/55  Pulse: 90 79 70 67  Resp: '18 20 16 15  '$ Temp:    98.3 F (36.8 C)  TempSrc:    Oral  SpO2: 100% 100% 97% 98%  Weight:      Height:         General: Well developed, well  nourished, in no acute distress. Room air. Head: Normocephalic, atraumatic, sclera non-icteric, mucus membranes are moist. Neck: Supple without lymphadenopathy/masses. JVD not elevated. Lungs: Clear bilaterally to auscultation without wheezes, rales, or rhonchi. Breathing is unlabored. Heart: RRR with normal S1, S2. No murmurs, rubs, or gallops appreciated. Abdomen: Soft, mildly distended. No guarding. Musculoskeletal:  Strength and tone appear normal for age. Lower extremities: No edema or ischemic changes, no open wounds. Neuro: Alert and oriented X 3. Moves all extremities spontaneously. Psych:  Responds to questions appropriately with a normal affect. Dialysis Access: R femoral TDC, small amt blood in lumen.  Allergies  Allergen Reactions   Levofloxacin Itching and Rash   Tape Other (  See Comments)    "Certain surgical tapes peel off my skin"   Prior to Admission medications   Medication Sig Start Date End Date Taking? Authorizing Provider  acetaminophen (TYLENOL) 650 MG CR tablet Take 1,300 mg by mouth every 8 (eight) hours as needed for pain.   Yes [provider]  allopurinol (ZYLOPRIM) 100 MG tablet TAKE 1 TABLET BY MOUTH EVERY DAY Patient taking differently: Take 100 mg by mouth daily. 04/09/21  Yes Vevelyn Francois, NP  amiodarone (PACERONE) 200 MG tablet Take 1 tablet (200 mg total) by mouth daily. 05/07/21  Yes Milford, Maricela Bo, FNP  apixaban (ELIQUIS) 2.5 MG TABS tablet Take 1 tablet (2.5 mg total) by mouth 2 (two) times daily. 05/07/21  Yes Milford, Maricela Bo, FNP  B Complex-C-Zn-Folic Acid (DIALYVITE 962-IWLN 15) 0.8 MG TABS Take 0.8 mg by mouth daily. 12/30/20  Yes [provider]  busPIRone (BUSPAR) 10 MG tablet Take 5 mg by mouth 2 (two) times daily. 03/29/20  Yes [provider]  calcium acetate (PHOSLO) 667 MG capsule Take 667 mg by mouth 3 (three) times daily with meals. 07/25/20  Yes [provider]  Colchicine 0.6 MG CAPS Take 0.6 mg by  mouth daily as needed (gout flare up).   Yes [provider]  diclofenac Sodium (VOLTAREN) 1 % GEL APPLY 2 GRAMS TO AFFECTED AREA 4 TIMES A DAY Patient taking differently: Apply 2 g topically 4 (four) times daily. 03/24/21  Yes Aundra Dubin, PA-C  diphenhydrAMINE (BENADRYL) 25 mg capsule Take 1 capsule (25 mg total) by mouth every 8 (eight) hours as needed. Patient taking differently: Take 25 mg by mouth every 8 (eight) hours as needed for itching. 07/21/19  Yes [name], FNP  escitalopram (LEXAPRO) 20 MG tablet Take 20 mg by mouth daily. 03/21/21  Yes [provider]  famotidine (PEPCID) 20 MG tablet Take 20 mg by mouth 2 (two) times daily. 06/15/21  Yes [provider]  fluticasone (FLONASE) 50 MCG/ACT nasal spray Place 1 spray into both nostrils daily as needed for allergies or rhinitis.   Yes [provider]  fluticasone-salmeterol (ADVAIR HFA) 115-21 MCG/ACT inhaler Inhale 2 puffs into the lungs 2 (two) times daily. 04/28/21  Yes Freddi Starr, MD  gabapentin (NEURONTIN) 100 MG capsule TAKE 1 CAPSULE BY MOUTH TWICE A DAY MAX 2 CAPSULES DAILY Patient taking differently: Take 100 mg by mouth 2 (two) times daily. 06/19/21  Yes Passmore, Jake Church I, NP  latanoprost (XALATAN) 0.005 % ophthalmic solution Place 1 drop into the left eye at bedtime. 03/30/17  Yes [provider]  lidocaine (LIDODERM) 5 % Place 1 patch onto the skin daily as needed (for pain- remove old patch first).  10/28/18  Yes [provider]  Methoxy PEG-Epoetin Beta (MIRCERA IJ) Inject 1 Dose as directed See admin instructions. Unknown to pt 03/06/21 03/05/22 Yes [provider]  metoCLOPramide (REGLAN) 10 MG tablet Take 1 tablet (10 mg total) by mouth every 8 (eight) hours as needed for nausea. 10/23/20 10/23/21 Yes King, Diona Foley, NP  metoCLOPramide (REGLAN) 5 MG tablet TAKE 1 TABLET (5 MG TOTAL) BY MOUTH 2 (TWO) TIMES DAILY BEFORE A MEAL. 04/24/21  Yes Milus Banister, MD  midodrine (PROAMATINE) 10 MG tablet Take 1.5 tablets (15 mg total) by mouth 3 (three) times daily. 05/07/21  Yes Milford, Maricela Bo, FNP  omeprazole (PRILOSEC) 40 MG capsule TAKE 1 CAPSULE (40 MG TOTAL) BY MOUTH DAILY. 01/28/21  Yes Ardis Hughs,  Melene Plan, MD  ondansetron (ZOFRAN-ODT) 4 MG disintegrating tablet Take 1 tablet (4 mg total) by mouth every 8 (eight) hours as needed for nausea or vomiting. 01/24/21  Yes Vevelyn Francois, NP  oxyCODONE-acetaminophen (PERCOCET) 10-325 MG tablet Take 1 tablet by mouth 4 (four) times daily as needed for pain. 09/08/19  Yes [provider]  Vitamin D, Ergocalciferol, (DRISDOL) 1.25 MG (50000 UNIT) CAPS capsule TAKE 1 CAPSULE (50,000 UNITS TOTAL) BY MOUTH EVERY 7 (SEVEN) DAYS Patient taking differently: Take 50,000 Units by mouth every 7 (seven) days. 04/09/21  Yes Vevelyn Francois, NP  zolpidem (AMBIEN) 10 MG tablet Take 1 tablet (10 mg total) by mouth at bedtime as needed for sleep. 08/24/19  Yes [name], Pratik D, DO   No current facility-administered medications for this encounter.   Current Outpatient Medications  Medication Sig Dispense Refill   acetaminophen (TYLENOL) 650 MG CR tablet Take 1,300 mg by mouth every 8 (eight) hours as needed for pain.     allopurinol (ZYLOPRIM) 100 MG tablet TAKE 1 TABLET BY MOUTH EVERY DAY (Patient taking differently: Take 100 mg by mouth daily.) 90 tablet 3   amiodarone (PACERONE) 200 MG tablet Take 1 tablet (200 mg total) by mouth daily. 30 tablet 3   apixaban (ELIQUIS) 2.5 MG TABS tablet Take 1 tablet (2.5 mg total) by mouth 2 (two) times daily. 60 tablet 3   B Complex-C-Zn-Folic Acid (DIALYVITE 086-PYPP 15) 0.8 MG TABS Take 0.8 mg by mouth daily.     busPIRone (BUSPAR) 10 MG tablet Take 5 mg by mouth 2 (two) times daily.     calcium acetate (PHOSLO) 667 MG capsule Take 667 mg by mouth 3 (three) times daily with meals.     Colchicine 0.6 MG CAPS Take 0.6 mg by mouth daily as needed (gout flare up).      diclofenac Sodium (VOLTAREN) 1 % GEL APPLY 2 GRAMS TO AFFECTED AREA 4 TIMES A DAY (Patient taking differently: Apply 2 g topically 4 (four) times daily.) 200 g 2   diphenhydrAMINE (BENADRYL) 25 mg capsule Take 1 capsule (25 mg total) by mouth every 8 (eight) hours as needed. (Patient taking differently: Take 25 mg by mouth every 8 (eight) hours as needed for itching.) 30 capsule 6   escitalopram (LEXAPRO) 20 MG tablet Take 20 mg by mouth daily.     famotidine (PEPCID) 20 MG tablet Take 20 mg by mouth 2 (two) times daily.     fluticasone (FLONASE) 50 MCG/ACT nasal spray Place 1 spray into both nostrils daily as needed for allergies or rhinitis.     fluticasone-salmeterol (ADVAIR HFA) 115-21 MCG/ACT inhaler Inhale 2 puffs into the lungs 2 (two) times daily. 1 each 12   gabapentin (NEURONTIN) 100 MG capsule TAKE 1 CAPSULE BY MOUTH TWICE A DAY MAX 2 CAPSULES DAILY (Patient taking differently: Take 100 mg by mouth 2 (two) times daily.) 60 capsule 1   latanoprost (XALATAN) 0.005 % ophthalmic solution Place 1 drop into the left eye at bedtime.  3   lidocaine (LIDODERM) 5 % Place 1 patch onto the skin daily as needed (for pain- remove old patch first).      Methoxy PEG-Epoetin Beta (MIRCERA IJ) Inject 1 Dose as directed See admin instructions. Unknown to pt     metoCLOPramide (REGLAN) 10 MG tablet Take 1 tablet (10 mg total) by mouth every 8 (eight) hours as needed for nausea. 90 tablet 0   metoCLOPramide (REGLAN) 5 MG tablet TAKE 1 TABLET (5  MG TOTAL) BY MOUTH 2 (TWO) TIMES DAILY BEFORE A MEAL. 180 tablet 3   midodrine (PROAMATINE) 10 MG tablet Take 1.5 tablets (15 mg total) by mouth 3 (three) times daily. 405 tablet 3   omeprazole (PRILOSEC) 40 MG capsule TAKE 1 CAPSULE (40 MG TOTAL) BY MOUTH DAILY. 90 capsule 1   ondansetron (ZOFRAN-ODT) 4 MG disintegrating tablet Take 1 tablet (4 mg total) by mouth every 8 (eight) hours as needed for nausea or vomiting. 20 tablet 11   oxyCODONE-acetaminophen (PERCOCET)  10-325 MG tablet Take 1 tablet by mouth 4 (four) times daily as needed for pain.     Vitamin D, Ergocalciferol, (DRISDOL) 1.25 MG (50000 UNIT) CAPS capsule TAKE 1 CAPSULE (50,000 UNITS TOTAL) BY MOUTH EVERY 7 (SEVEN) DAYS (Patient taking differently: Take 50,000 Units by mouth every 7 (seven) days.) 5 capsule 6   zolpidem (AMBIEN) 10 MG tablet Take 1 tablet (10 mg total) by mouth at bedtime as needed for sleep. 10 tablet 0   Labs: Basic Metabolic Panel: Recent Labs  Lab 09/06/21 0815 09/06/21 0921  NA 136 133*  K 4.1 5.4*  CL 101 104  CO2 18*  --   GLUCOSE 91 90  BUN 54* 83*  CREATININE 7.39* 8.40*  CALCIUM 9.4  --    Liver Function Tests: Recent Labs  Lab 09/06/21 0815  AST 33  ALT 20  ALKPHOS 135*  BILITOT 0.7  PROT 7.0  ALBUMIN 4.2   Recent Labs  Lab 09/06/21 0815  LIPASE 54*   CBC: Recent Labs  Lab 09/06/21 0815 09/06/21 0921  WBC 4.8  --   HGB 13.1 12.6  HCT 40.9 37.0  MCV 90.5  --   PLT 179  --    Studies/Results: CT ABDOMEN PELVIS WO CONTRAST  Result Date: 09/06/2021 CLINICAL DATA:  Abdominal pain, acute nonlocalized. Patient woke with stomach pain and nausea. Hemodialysis patient. EXAM: CT ABDOMEN AND PELVIS WITHOUT CONTRAST TECHNIQUE: Multidetector CT imaging of the abdomen and pelvis was performed following the standard protocol without IV contrast. RADIATION DOSE REDUCTION: This exam was performed according to the departmental dose-optimization program which includes automated exposure control, adjustment of the mA and/or kV according to patient size and/or use of iterative reconstruction technique. COMPARISON:  Prior CTs 03/30/2021 FINDINGS: Lower chest: Interval improved aeration of the lung bases with chronic right basilar scarring. No significant pleural or pericardial effusion. Chronic dilatation of the azygous/hemi azygous veins with prominent venous collaterals along the right heart border. Hepatobiliary: Stable mild contour irregularity of the right  lobe inferiorly. No focal hepatic abnormality on noncontrast imaging. No evidence of gallstones, gallbladder wall thickening or biliary dilatation. Pancreas: Unremarkable. No pancreatic ductal dilatation or surrounding inflammatory changes. Spleen: The spleen appears stable at the upper limits of normal. No focal abnormality identified. Adrenals/Urinary Tract: No evidence of adrenal mass. The right adrenal gland appears normal. Left adrenal gland not well seen, possibly previously resected. The native kidneys have been previously resected. Transplant kidney in the right iliac fossa appears unchanged with chronic cortical thinning, but no hydronephrosis or perinephric fluid collection. The bladder appears unremarkable for its degree of distention. Stomach/Bowel: No enteric contrast administered. The stomach appears unremarkable for its degree of distension. No evidence of bowel wall thickening, distention or surrounding inflammatory change. Vascular/Lymphatic: Right femoral vein hemodialysis catheter is unchanged within the IVC inferior to the renal veins. Numerous abdominal wall, mesenteric and retroperitoneal venous collaterals are noted. There is diffuse aortic and branch vessel atherosclerosis without evidence of aneurysm.  Allowing for the retroperitoneal collaterals and lack of contrast, no enlarged lymph nodes are identified. Reproductive: The uterus and ovaries appear unremarkable. No adnexal mass. Other: Chronic dense calcification anterior to the right iliac vessels measuring 2.4 cm on image 53/7. Postsurgical changes in the intra-abdominal wall. No ascites. Musculoskeletal: No acute or significant osseous findings. 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 Generalized osseous demineralization. IMPRESSION: 1. No acute abdominopelvic findings identified. 2. Stable appearance of the transplant kidney in the right iliac fossa. 3. Numerous abdominal wall, mesenteric and  retroperitoneal venous collaterals are grossly stable allowing for noncontrast technique. Diffuse aortic and branch vessel atherosclerosis. Aortic Atherosclerosis (ICD10-I70.0). Electronically Signed   By: Richardean Sale M.D.   On: 09/06/2021 11:03   DG Chest Portable 1 View  Result Date: 09/06/2021 CLINICAL DATA:  End stage renal disease.  Abdominal pain. EXAM: PORTABLE CHEST 1 VIEW COMPARISON:  03/30/2021 FINDINGS: There is a stent in the expected location of the SVC. Stable cardiomediastinal contours. Chronic interstitial coarsening. Blunting of the right costophrenic angle is identified which may represent a small pleural effusion. No frank interstitial edema or airspace consolidation. IMPRESSION: 1. Suspect small right pleural effusion. 2. Chronic interstitial coarsening. Electronically Signed   By: Kerby Moors M.D.   On: 09/06/2021 10:12    Dialysis Orders:  TTS at [facility] 4hr, 400/700, EDW 50.4kg, 2K/2Ca, R femoral TDC, heparin 2000 unit bolus - Mircera 156mg IV q 2 weeks (last 7/27) - Calcitriol 1.514m PO q HD  Assessment/Plan:  Abdominal pain/vomiting: ?Gastroparesis flare. Abd CT without acute findings. Per primary.  ESRD:  Usual TTS schedule - due for HD today. Will dialyze today, although it will be later tonight d/t scheduling. K 5.4, but looks like already given 10g Lokelma to hold her over.  Hypotension/volume: No LE edema and with chronic low BP on midodrine TID, per patient. Low UF goal.  Anemia: Hgb > 12, no ESA for now.  Metabolic bone disease: Ca ok, Phos pending. Continue home meds as diet is tolerated.  Hx CVA  KaPollyann Kennedy/06/2021, 2:52 PM  [employer]

## 2021-09-07 DIAGNOSIS — Z992 Dependence on renal dialysis: Secondary | ICD-10-CM

## 2021-09-07 DIAGNOSIS — R112 Nausea with vomiting, unspecified: Secondary | ICD-10-CM

## 2021-09-07 DIAGNOSIS — K3184 Gastroparesis: Secondary | ICD-10-CM | POA: Diagnosis not present

## 2021-09-07 DIAGNOSIS — N186 End stage renal disease: Secondary | ICD-10-CM | POA: Diagnosis not present

## 2021-09-07 DIAGNOSIS — R9431 Abnormal electrocardiogram [ECG] [EKG]: Secondary | ICD-10-CM | POA: Diagnosis not present

## 2021-09-07 DIAGNOSIS — E1143 Type 2 diabetes mellitus with diabetic autonomic (poly)neuropathy: Secondary | ICD-10-CM | POA: Diagnosis not present

## 2021-09-07 LAB — BASIC METABOLIC PANEL
Anion gap: 13 (ref 5–15)
BUN: 36 mg/dL — ABNORMAL HIGH (ref 6–20)
CO2: 25 mmol/L (ref 22–32)
Calcium: 9.1 mg/dL (ref 8.9–10.3)
Chloride: 97 mmol/L — ABNORMAL LOW (ref 98–111)
Creatinine, Ser: 5.85 mg/dL — ABNORMAL HIGH (ref 0.44–1.00)
GFR, Estimated: 8 mL/min — ABNORMAL LOW (ref >60)
Glucose, Bld: 84 mg/dL (ref 70–99)
Potassium: 3.7 mmol/L (ref 3.5–5.1)
Sodium: 135 mmol/L (ref 135–145)

## 2021-09-07 LAB — CBC
HCT: 36.6 % (ref 36.0–46.0)
Hemoglobin: 11.9 g/dL — ABNORMAL LOW (ref 12.0–15.0)
MCH: 29.8 pg (ref 26.0–34.0)
MCHC: 32.5 g/dL (ref 30.0–36.0)
MCV: 91.5 fL (ref 80.0–100.0)
Platelets: 147 10*3/uL — ABNORMAL LOW (ref 150–400)
RBC: 4 MIL/uL (ref 3.87–5.11)
RDW: 24.7 % — ABNORMAL HIGH (ref 11.5–15.5)
WBC: 4.1 10*3/uL (ref 4.0–10.5)
nRBC: 0.5 % — ABNORMAL HIGH (ref 0.0–0.2)

## 2021-09-07 MED ORDER — METOCLOPRAMIDE HCL 5 MG PO TABS: 5.0000 mg | ORAL_TABLET | Freq: Two times a day (BID) | ORAL | Status: DC | PRN

## 2021-09-07 MED ORDER — HEPARIN SODIUM (PORCINE) 1000 UNIT/ML IJ SOLN
INTRAMUSCULAR | Status: AC
  Administered 2021-09-07: 1000 [IU]
  Filled 2021-09-07: qty 3

## 2021-09-07 NOTE — Discharge Summary (Signed)
Name: Tina Watkins MRN: [id number] DOB: 11-Oct-1965 56 y.o. PCP: Tina Watkins  Date of Admission: 09/06/2021  7:43 AM Date of Discharge: 09/07/2021 Attending Physician: Velna Ochs, MD  Discharge Diagnosis: 1. Principal Problem:   Nausea & vomiting Active Problems:   Gastroparesis   ESRD (end stage renal disease) (HCC)   Prolonged Q-T interval on ECG  Discharge Medications: Allergies as of 09/07/2021       Reactions   Levofloxacin Itching, Rash   Tape Other (See Comments)   "Certain surgical tapes peel off my skin"        Medication List     TAKE these medications    acetaminophen 650 MG CR tablet Commonly known as: TYLENOL Take 1,300 mg by mouth every 8 (eight) hours as needed for pain.   Advair HFA 115-21 MCG/ACT inhaler Generic drug: fluticasone-salmeterol Inhale 2 puffs into the lungs 2 (two) times daily.   allopurinol 100 MG tablet Commonly known as: ZYLOPRIM TAKE 1 TABLET BY MOUTH EVERY DAY   amiodarone 200 MG tablet Commonly known as: PACERONE Take 1 tablet (200 mg total) by mouth daily.   apixaban 2.5 MG Tabs tablet Commonly known as: Eliquis Take 1 tablet (2.5 mg total) by mouth 2 (two) times daily.   busPIRone 10 MG tablet Commonly known as: BUSPAR Take 5 mg by mouth 2 (two) times daily.   calcium acetate 667 MG capsule Commonly known as: PHOSLO Take 667 mg by mouth 3 (three) times daily with meals.   Colchicine 0.6 MG Caps Take 0.6 mg by mouth daily as needed (gout flare up).   Dialyvite 800-Zinc 15 0.8 MG Tabs Take 0.8 mg by mouth daily.   diclofenac Sodium 1 % Gel Commonly known as: VOLTAREN APPLY 2 GRAMS TO AFFECTED AREA 4 TIMES A DAY What changed: See the new instructions.   diphenhydrAMINE 25 mg capsule Commonly known as: BENADRYL Take 1 capsule (25 mg total) by mouth every 8 (eight) hours as needed. What changed: reasons to take this   escitalopram 20 MG tablet Commonly known as: LEXAPRO Take 20 mg by  mouth daily.   famotidine 20 MG tablet Commonly known as: PEPCID Take 20 mg by mouth 2 (two) times daily.   fluticasone 50 MCG/ACT nasal spray Commonly known as: FLONASE Place 1 spray into both nostrils daily as needed for allergies or rhinitis.   gabapentin 100 MG capsule Commonly known as: NEURONTIN TAKE 1 CAPSULE BY MOUTH TWICE A DAY MAX 2 CAPSULES DAILY What changed: See the new instructions.   latanoprost 0.005 % ophthalmic solution Commonly known as: XALATAN Place 1 drop into the left eye at bedtime.   lidocaine 5 % Commonly known as: LIDODERM Place 1 patch onto the skin daily as needed (for pain- remove old patch first).   metoCLOPramide 5 MG tablet Commonly known as: REGLAN TAKE 1 TABLET (5 MG TOTAL) BY MOUTH 2 (TWO) TIMES DAILY BEFORE A MEAL. What changed: Another medication with the same name was removed. Continue taking this medication, and follow the directions you see here.   midodrine 10 MG tablet Commonly known as: PROAMATINE Take 1.5 tablets (15 mg total) by mouth 3 (three) times daily.   MIRCERA IJ Inject 1 Dose as directed See admin instructions. Unknown to pt   omeprazole 40 MG capsule Commonly known as: PRILOSEC TAKE 1 CAPSULE (40 MG TOTAL) BY MOUTH DAILY.   ondansetron 4 MG disintegrating tablet Commonly known as: ZOFRAN-ODT Take 1 tablet (4 mg total) by mouth  every 8 (eight) hours as needed for nausea or vomiting.   oxyCODONE-acetaminophen 10-325 MG tablet Commonly known as: PERCOCET Take 1 tablet by mouth 4 (four) times daily as needed for pain.   Vitamin D (Ergocalciferol) 1.25 MG (50000 UNIT) Caps capsule Commonly known as: DRISDOL TAKE 1 CAPSULE (50,000 UNITS TOTAL) BY MOUTH EVERY 7 (SEVEN) DAYS   zolpidem 10 MG tablet Commonly known as: AMBIEN Take 1 tablet (10 mg total) by mouth at bedtime as needed for sleep.        Disposition and follow-up:   Tina Watkins was discharged from [facility] in Good  condition.  At the hospital follow up visit please address:  1.  Gastroparesis - Nausea and vomiting resulting in missed dialysis sessions -Prolonged QTc  2.  Labs / imaging needed at time of follow-up: CBC, BMP, EKG  3.  Pending labs/ test needing follow-up: none  Follow-up Appointments: - Patient will follow up with her primary care doctor today Tina Merino, Watkins within the next week or two.  Hospital Course by problem list:  Tina Watkins is a 56 y/o female with history of ESRD on HD (TTS), previous renal transplant, GERD, unspecified gastroparesis, A-fib on eliquis, CHF, CVA, COPD (not on home O2) presented with nausea, vomiting, and abdominal pain and admitted for management of likely gastroparesis and for completion of HD.    # Nausea/vomiting/abdominal pain likely secondary to gastroparesis Patient was unable to finish her Saturday outpatient dialysis session due to nausea, vomiting, and abdominal pain. CT abdomen/pelvis demonstrated no acute abdominopelvic findings. Patient's nausea and vomiting improved after receiving Reglan, Zofran, and Dilaudid in the ED. Patient given BuSpar and Ativan for anxiety in the ED. Reglan was then held due to prolonged QTc.  Dilaudid given for control.  Pantoprazole given for nausea and vomiting.  Patient's nausea, vomiting, and abdominal pain improved upon day of discharge.    # ESRD on HD Nephrology was consulted.  Patient received dialysis while hospitalized on 09/06/2021.  Patient will continue with normal TTS dialysis regimen outpatient.     3. # Hypotension Blood pressure was 81/55 on arrival to the ED.  Improved to 107/66 after 500 mL LR bolus given in the ED. Patient stated that low blood pressure is common for her.  Blood pressure remained stable.    4. # Atrial fibrillation No episodes of A-fib while hospitalized.  No tachycardia.  Continued home medications including Eliquis 2.5 mg BID and Amiodarone 200 mg.   5. # GERD Continued  home Famotidine 20 mg.   6. # Anxiety Continued home medications including Lexapro 20 mg and Buspar 5 mg BID.   7. # Chronic gout due to renal impairment No complaints of extremity pain throughout her hospital stay.  No acute gout flares.  Continued home Allopurinol 100 mg .   Discharge Exam:   BP 95/61   Pulse 66   Temp 97.6 F (36.4 C) (Oral)   Resp 18   Ht '4\' 11"'$  (1.499 m)   Wt 43.6 kg   SpO2 98%   BMI 19.41 kg/m   Constitutional:      General: She is not in acute distress.    Appearance: She is not ill-appearing.     Comments: Alert and oriented to person and place. Not actively vomiting.  Cardiovascular:     Rate and Rhythm: Normal rate and regular rhythm.     Pulses: Normal pulses.     Heart sounds: 2/5 Systolic murmur heard  best at the left sternal border.     Comments: No LE edema Pulmonary:     Effort: Pulmonary effort is normal. No respiratory distress.     Breath sounds: Normal breath sounds. No wheezing.  Abdominal:     General: Bowel sounds are normal. Mild abdominal distension.  Mild tenderness to palpation of LUQ, RUQ, epigastric region.  No guarding. No rebound tenderness.  Skin:    General: Skin is warm and dry.  Neurological:     Mental Status: She is alert.   Pertinent Labs, Studies, and Procedures:     Latest Ref Rng & Units 09/07/2021    8:23 AM 09/06/2021    9:21 AM 09/06/2021    8:15 AM  CBC  WBC 4.0 - 10.5 K/uL 4.1   4.8   Hemoglobin 12.0 - 15.0 g/dL 11.9  12.6  13.1   Hematocrit 36.0 - 46.0 % 36.6  37.0  40.9   Platelets 150 - 400 K/uL 147   179        Latest Ref Rng & Units 09/07/2021    8:23 AM 09/06/2021    9:21 AM 09/06/2021    8:15 AM  BMP  Glucose 70 - 99 mg/dL 84  90  91   BUN 6 - 20 mg/dL 36  83  54   Creatinine 0.44 - 1.00 mg/dL 5.85  8.40  7.39   Sodium 135 - 145 mmol/L 135  133  136   Potassium 3.5 - 5.1 mmol/L 3.7  5.4  4.1   Chloride 98 - 111 mmol/L 97  104  101   CO2 22 - 32 mmol/L 25   18   Calcium 8.9 - 10.3 mg/dL 9.1   9.4      CT Abdomen/Pelvis  IMPRESSION: 1. No acute abdominopelvic findings identified. 2. Stable appearance of the transplant kidney in the right iliac fossa. 3. Numerous abdominal wall, mesenteric and retroperitoneal venous collaterals are grossly stable allowing for noncontrast technique. Diffuse aortic and branch vessel atherosclerosis. Aortic Atherosclerosis   CXR  IMPRESSION: 1. Suspect small right pleural effusion. 2. Chronic interstitial coarsening.   Discharge Instructions: Discharge Instructions     Call MD for:  persistant dizziness or light-headedness   Complete by: As directed    Call MD for:  persistant nausea and vomiting   Complete by: As directed    Call MD for:  severe uncontrolled pain   Complete by: As directed    Diet - low sodium heart healthy   Complete by: As directed    Increase activity slowly   Complete by: As directed       You were hospitalized for nausea, vomiting, and abdominal pain likely due to your gastroparesis as well as for hemodialysis.   Hospital course: - We gave you medications to improve your nausea, vomiting, and abdominal pain. You received your Saturday hemodialysis session while in the hospital.   Medications: -Please continue taking: -Metoclopramide (Reglan) 5 mg tablet (1 tablet by mouth 2 times daily before meals) for nausea. -Acetaminophen (Tylenol) 650 mg CR tablet (2 tablets (1300 mg total) by mouth every 8 hours as needed) for pain. -Fluticasone-salmeterol (Advair HFA) 115-21 mcg/ACT inhaler (inhale 2 puffs into the lungs 2 times daily) for wheezing. -Allopurinol (Zyloprim) 100 mg tablet (1 tablet by mouth daily) for gout. -Amiodarone (Pacerone) 200 mg tablet (1 tablet by mouth daily) for your atrial fibrillation. -Apixaban (Eliquis) 2.5 mg tablet (take 1 tablet by mouth 2 times daily) for your atrial fibrillation. -  Buspirone (BuSpar) 10 mg tablet (take 5 mg by mouth 2 times daily) for anxiety. -Calcium acetate (PhosLo)  667 mg capsule (take 667 mg by mouth 3 times daily with meals) for your kidney disease. -Colchicine 0.6 mg caps (1 cap by mouth daily as needed) for gout. -B complex-C Zn-folic acid (Dialyvite [id number] 15) 0.8 mg tablet (1 by mouth daily) for your kidney disease. -Diclofenac sodium (Voltaren) 1% gel (apply 2 g to affected area 4 times a day) for pain. -Diphenhydramine (Benadryl) 25 mg tablet (1 capsule by mouth every 8 hours as needed) for nausea. -Escitalopram (Lexapro) 20 mg (1 tablet by mouth daily) for depression or anxiety. -Famotidine (Pepcid) 20 mg tablet (1 tablet by mouth 2 times daily) for nausea. -Fluticasone (Flonase) 50 mcg/ACT nasal spray (1 spray in both nostrils daily as needed) for allergies. -Gabapentin (Neurontin) 100 mg capsule (take 1 by mouth 2 times daily) for nerve pain. -Latanoprost (Xalatan) 0.005% ophthalmic solution (Place 1 drop into the left eye at bedtime) for eye pain. -Lidocaine (Lidoderm) 5% (1 patch on the skin daily as needed) for pain. -Midodrine (Proamatine) 10 mg tablet (take 1.5 tablet (15 mg total) by mouth 3 times daily) for low blood pressure. -Methoxy PEG-Epoetin Beta (Mircera IJ) (inject 1 dose as directed) for your kidney disease. -Omeprazole (Prilosec) 40 mg capsule (take 1 capsule by mouth daily) for nausea. -Ondansetron (Zofran-ODT) 4 mg disintegrating tablet (1 tablet by mouth every 8 hours as needed) for nausea vomiting. -Oxycodone-acetaminophen (Percocet) 10-325 mg tablet (1 tablet by mouth 4 times daily as needed) for pain. -Vitamin D ergocalciferol (Drisdol) 1.25 mg capsule (take 1 capsule by mouth every 7 days) for your bone health. -Zolpidem (Ambien) 10 mg tablet (take 1 tablet by mouth at bedtime as needed) for sleep.   -Please stop taking: -Metoclopramide (Reglan) 10 mg tablet (1 tablet by mouth every 8 hours as needed) for nausea.   Follow-up: - Please follow-up with your primary care doctor today Tina Merino, Watkins within the next  week or two.   Signed: Starlyn Skeans, MD 09/07/2021, 3:43 PM   Pager: [phone]

## 2021-09-07 NOTE — Progress Notes (Addendum)
Received patient in bed to unit.  Alert and oriented.  Informed consent signed and in  chart.   Treatment initiated: 2313 Treatment completed: 0214  Patient tolerated well.  Transported back to the room  alert, without acute distress.  Hand-off given to patient's nurse.   Access used: catheter Access issues: none  Total UF removed: 1800 Medication(s) given: albumin, midodrine Post HD VS: 97.3-temp, 109/69(82), HR-60, SP02-98, RR-11  Post HD weight: 43.6kg   [facility]

## 2021-09-07 NOTE — Discharge Instructions (Addendum)
You were hospitalized for nausea, vomiting, and abdominal pain likely due to your gastroparesis as well as for hemodialysis.  Hospital course: - We gave you medications to improve your nausea, vomiting, and abdominal pain. You received your Saturday hemodialysis session while in the hospital.  Medications: -Please continue taking: -Metoclopramide (Reglan) 5 mg tablet (1 tablet by mouth 2 times daily before meals) for nausea. -Acetaminophen (Tylenol) 650 mg CR tablet (2 tablets (1300 mg total) by mouth every 8 hours as needed) for pain. -Fluticasone-salmeterol (Advair HFA) 115-21 mcg/ACT inhaler (inhale 2 puffs into the lungs 2 times daily) for wheezing. -Allopurinol (Zyloprim) 100 mg tablet (1 tablet by mouth daily) for gout. -Amiodarone (Pacerone) 200 mg tablet (1 tablet by mouth daily) for your atrial fibrillation. -Apixaban (Eliquis) 2.5 mg tablet (take 1 tablet by mouth 2 times daily) for your atrial fibrillation. -Buspirone (BuSpar) 10 mg tablet (take 5 mg by mouth 2 times daily) for anxiety. -Calcium acetate (PhosLo) 667 mg capsule (take 667 mg by mouth 3 times daily with meals) for your kidney disease. -Colchicine 0.6 mg caps (1 cap by mouth daily as needed) for gout. -B complex-C Zn-folic acid (Dialyvite 259-DGLO 15) 0.8 mg tablet (1 by mouth daily) for your kidney disease. -Diclofenac sodium (Voltaren) 1% gel (apply 2 g to affected area 4 times a day) for pain. -Diphenhydramine (Benadryl) 25 mg tablet (1 capsule by mouth every 8 hours as needed) for nausea. -Escitalopram (Lexapro) 20 mg (1 tablet by mouth daily) for depression or anxiety. -Famotidine (Pepcid) 20 mg tablet (1 tablet by mouth 2 times daily) for nausea. -Fluticasone (Flonase) 50 mcg/ACT nasal spray (1 spray in both nostrils daily as needed) for allergies. -Gabapentin (Neurontin) 100 mg capsule (take 1 by mouth 2 times daily) for nerve pain. -Latanoprost (Xalatan) 0.005% ophthalmic solution (Place 1 drop into the left eye  at bedtime) for eye pain. -Lidocaine (Lidoderm) 5% (1 patch on the skin daily as needed) for pain. -Midodrine (Proamatine) 10 mg tablet (take 1.5 tablet (15 mg total) by mouth 3 times daily) for low blood pressure. -Methoxy PEG-Epoetin Beta (Mircera IJ) (inject 1 dose as directed) for your kidney disease. -Omeprazole (Prilosec) 40 mg capsule (take 1 capsule by mouth daily) for nausea. -Ondansetron (Zofran-ODT) 4 mg disintegrating tablet (1 tablet by mouth every 8 hours as needed) for nausea vomiting. -Oxycodone-acetaminophen (Percocet) 10-325 mg tablet (1 tablet by mouth 4 times daily as needed) for pain. -Vitamin D ergocalciferol (Drisdol) 1.25 mg capsule (take 1 capsule by mouth every 7 days) for your bone health. -Zolpidem (Ambien) 10 mg tablet (take 1 tablet by mouth at bedtime as needed) for sleep.  -Please stop taking: -Metoclopramide (Reglan) 10 mg tablet (1 tablet by mouth every 8 hours as needed) for nausea.  Follow-up: - Please follow-up with your primary care doctor today Bo Merino, NP within the next week or two.

## 2021-09-07 NOTE — Progress Notes (Signed)
[facility] Progress Note   Subjective:  Seen in room. No vomiting overnight, abd pain better. Did fine with dialysis last night except for chronic hypotension for which she was given a dose of IV albumin. Her TDC ran fine. No CP/dyspnea.   Objective Vitals:   09/07/21 0333 09/07/21 0512 09/07/21 0743 09/07/21 0928  BP: (!) 97/58 (!) 89/45  95/61  Pulse: 60 (!) 56  66  Resp: '16 18  18  '$ Temp: 98.8 F (37.1 C) 98.2 F (36.8 C)  97.6 F (36.4 C)  TempSrc: Oral Oral  Oral  SpO2: 97% 99% 98% 98%  Weight:      Height:       Physical Exam General: Well appearing woman, NAD. Room air. Heart: RRR; no murmur Lungs: CTAB Abdomen: soft, non-tender Extremities: No LE edema Dialysis Access: R femoral [facility]  Additional Objective Labs: Basic Metabolic Panel: Recent Labs  Lab 09/06/21 0815 09/06/21 0921 09/07/21 0823  NA 136 133* 135  K 4.1 5.4* 3.7  CL 101 104 97*  CO2 18*  --  25  GLUCOSE 91 90 84  BUN 54* 83* 36*  CREATININE 7.39* 8.40* 5.85*  CALCIUM 9.4  --  9.1   Liver Function Tests: Recent Labs  Lab 09/06/21 0815  AST 33  ALT 20  ALKPHOS 135*  BILITOT 0.7  PROT 7.0  ALBUMIN 4.2   Recent Labs  Lab 09/06/21 0815  LIPASE 54*   CBC: Recent Labs  Lab 09/06/21 0815 09/06/21 0921 09/07/21 0823  WBC 4.8  --  4.1  HGB 13.1 12.6 11.9*  HCT 40.9 37.0 36.6  MCV 90.5  --  91.5  PLT 179  --  147*   Studies/Results: CT ABDOMEN PELVIS WO CONTRAST  Result Date: 09/06/2021 CLINICAL DATA:  Abdominal pain, acute nonlocalized. Patient woke with stomach pain and nausea. Hemodialysis patient. EXAM: CT ABDOMEN AND PELVIS WITHOUT CONTRAST TECHNIQUE: Multidetector CT imaging of the abdomen and pelvis was performed following the standard protocol without IV contrast. RADIATION DOSE REDUCTION: This exam was performed according to the departmental dose-optimization program which includes automated exposure control, adjustment of the mA and/or kV according to  patient size and/or use of iterative reconstruction technique. COMPARISON:  Prior CTs 03/30/2021 FINDINGS: Lower chest: Interval improved aeration of the lung bases with chronic right basilar scarring. No significant pleural or pericardial effusion. Chronic dilatation of the azygous/hemi azygous veins with prominent venous collaterals along the right heart border. Hepatobiliary: Stable mild contour irregularity of the right lobe inferiorly. No focal hepatic abnormality on noncontrast imaging. No evidence of gallstones, gallbladder wall thickening or biliary dilatation. Pancreas: Unremarkable. No pancreatic ductal dilatation or surrounding inflammatory changes. Spleen: The spleen appears stable at the upper limits of normal. No focal abnormality identified. Adrenals/Urinary Tract: No evidence of adrenal mass. The right adrenal gland appears normal. Left adrenal gland not well seen, possibly previously resected. The native kidneys have been previously resected. Transplant kidney in the right iliac fossa appears unchanged with chronic cortical thinning, but no hydronephrosis or perinephric fluid collection. The bladder appears unremarkable for its degree of distention. Stomach/Bowel: No enteric contrast administered. The stomach appears unremarkable for its degree of distension. No evidence of bowel wall thickening, distention or surrounding inflammatory change. Vascular/Lymphatic: Right femoral vein hemodialysis catheter is unchanged within the IVC inferior to the renal veins. Numerous abdominal wall, mesenteric and retroperitoneal venous collaterals are noted. There is diffuse aortic and branch vessel atherosclerosis without evidence of aneurysm. Allowing for the retroperitoneal collaterals  and lack of contrast, no enlarged lymph nodes are identified. Reproductive: The uterus and ovaries appear unremarkable. No adnexal mass. Other: Chronic dense calcification anterior to the right iliac vessels measuring 2.4 cm on  image 53/7. Postsurgical changes in the intra-abdominal wall. No ascites. Musculoskeletal: No acute or significant osseous findings. 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 Generalized osseous demineralization. IMPRESSION: 1. No acute abdominopelvic findings identified. 2. Stable appearance of the transplant kidney in the right iliac fossa. 3. Numerous abdominal wall, mesenteric and retroperitoneal venous collaterals are grossly stable allowing for noncontrast technique. Diffuse aortic and branch vessel atherosclerosis. Aortic Atherosclerosis (ICD10-I70.0). Electronically Signed   By: Richardean Sale M.D.   On: 09/06/2021 11:03   DG Chest Portable 1 View  Result Date: 09/06/2021 CLINICAL DATA:  End stage renal disease.  Abdominal pain. EXAM: PORTABLE CHEST 1 VIEW COMPARISON:  03/30/2021 FINDINGS: There is a stent in the expected location of the SVC. Stable cardiomediastinal contours. Chronic interstitial coarsening. Blunting of the right costophrenic angle is identified which may represent a small pleural effusion. No frank interstitial edema or airspace consolidation. IMPRESSION: 1. Suspect small right pleural effusion. 2. Chronic interstitial coarsening. Electronically Signed   By: Kerby Moors M.D.   On: 09/06/2021 10:12   Medications:   acetaminophen  1,000 mg Oral TID   allopurinol  100 mg Oral Daily   amiodarone  200 mg Oral Daily   apixaban  2.5 mg Oral BID   busPIRone  5 mg Oral BID   calcium acetate  667 mg Oral TID WC   Chlorhexidine Gluconate Cloth  6 each Topical Q0600   escitalopram  20 mg Oral Daily   famotidine  20 mg Oral Daily   gabapentin  100 mg Oral BID   latanoprost  1 drop Left Eye QHS   midodrine  15 mg Oral TID WC   mometasone-formoterol  2 puff Inhalation BID   pantoprazole  40 mg Oral Daily    Dialysis Orders: TTS at [facility] 4hr, 400/700, EDW 50.4kg, 2K/2Ca, R femoral TDC, heparin 2000 unit bolus - Mircera 17mg IV q 2 weeks (last  7/27) - Calcitriol 1.53m PO q HD   Assessment/Plan:  Gastroparesis flare: Abd CT without acute findings. Pain/vomiting improved today.  ESRD:  Usual TTS schedule  - dialyzed yesterday without issues, next 8/8.  Hypotension/volume: No LE edema and with chronic low BP on midodrine TID, per patient. Low UF goal.  Anemia: Hgb > 12, no ESA for now.  Metabolic bone disease: Ca ok, Phos pending. Continue home meds as diet is tolerated.  Hx CVA Dispo: Ok from renal standpoint.    KaVeneta PentonPA-C 09/07/2021, 9:31 AM  [employer]

## 2021-09-07 NOTE — Progress Notes (Signed)
DISCHARGE NOTE HOME [name] to be discharged Home per MD order. Discussed prescriptions and follow up appointments with the patient. Prescriptions given to patient; medication list explained in detail. Patient verbalized understanding.  Skin clean, dry and intact without evidence of skin break down, no evidence of skin tears noted. IV catheter discontinued intact. Site without signs and symptoms of complications. Dressing and pressure applied. Pt denies pain at the site currently. No complaints noted.  Patient free of lines, drains, and wounds.   An After Visit Summary (AVS) was printed and given to the patient. Patient escorted via wheelchair, and discharged home via private auto.  Berneta Levins, RN

## 2021-09-08 ENCOUNTER — Telehealth: Payer: Self-pay | Admitting: Nephrology

## 2021-09-08 NOTE — Telephone Encounter (Signed)
Transition of care contact from inpatient facility  Date of discharge: 09/07/21 Date of contact: 09/08/21 Method: Phone Spoke to: Patient  Patient contacted to discuss transition of care from recent inpatient hospitalization. Patient was admitted to [facility] from 8/5-09/07/21 with discharge diagnosis of nausea/vomiting secondary to gastroparesis  Medication changes were reviewed.  Patient will follow up with his/her outpatient HD unit on: Tuesday 8/8.

## 2021-09-09 LAB — HEPATITIS B SURFACE ANTIBODY, QUANTITATIVE: Hep B S AB Quant (Post): 7.8 m[IU]/mL — ABNORMAL LOW (ref >9.9)

## 2021-09-17 ENCOUNTER — Encounter: Payer: Self-pay | Admitting: Acute Care

## 2021-09-17 ENCOUNTER — Ambulatory Visit (INDEPENDENT_AMBULATORY_CARE_PROVIDER_SITE_OTHER): Payer: Medicare Other | Admitting: Acute Care

## 2021-09-17 ENCOUNTER — Ambulatory Visit: Payer: Medicare Other

## 2021-09-17 DIAGNOSIS — F17210 Nicotine dependence, cigarettes, uncomplicated: Secondary | ICD-10-CM | POA: Diagnosis not present

## 2021-09-17 NOTE — Patient Instructions (Signed)
Thank you for participating in the [facility]. It was our pleasure to meet you today. We will call you with the results of your scan within the next few days. Your scan will be assigned a Lung RADS category score by the physicians reading the scans.  This Lung RADS score determines follow up scanning.  See below for description of categories, and follow up screening recommendations. We will be in touch to schedule your follow up screening annually or based on recommendations of our providers. We will fax a copy of your scan results to your Primary Care Physician, or the physician who referred you to the program, to ensure they have the results. Please call the office if you have any questions or concerns regarding your scanning experience or results.  Our office number is [phone]. Please speak with Denise Phelps, RN. , or  Denise Buckner RN, They are  our Lung Cancer Screening RN.'s If They are unavailable when you call, Please leave a message on the voice mail. We will return your call at our earliest convenience.This voice mail is monitored several times a day.  Remember, if your scan is normal, we will scan you annually as long as you continue to meet the criteria for the program. (Age 55-77, Current smoker or smoker who has quit within the last 15 years). If you are a smoker, remember, quitting is the single most powerful action that you can take to decrease your risk of lung cancer and other pulmonary, breathing related problems. We know quitting is hard, and we are here to help.  Please let us know if there is anything we can do to help you meet your goal of quitting. If you are a former smoker, congratulations. We are proud of you! Remain smoke free! Remember you can refer friends or family members through the number above.  We will screen them to make sure they meet criteria for the program. Thank you for helping us take better care of you by  participating in Lung Screening.  You can receive free nicotine replacement therapy ( patches, gum or mints) by calling 1-800-QUIT NOW. Please call so we can get you on the path to becoming  a non-smoker. I know it is hard, but you can do this!  Lung RADS Categories:  Lung RADS 1: no nodules or definitely non-concerning nodules.  Recommendation is for a repeat annual scan in 12 months.  Lung RADS 2:  nodules that are non-concerning in appearance and behavior with a very low likelihood of becoming an active cancer. Recommendation is for a repeat annual scan in 12 months.  Lung RADS 3: nodules that are probably non-concerning , includes nodules with a low likelihood of becoming an active cancer.  Recommendation is for a 6-month repeat screening scan. Often noted after an upper respiratory illness. We will be in touch to make sure you have no questions, and to schedule your 6-month scan.  Lung RADS 4 A: nodules with concerning findings, recommendation is most often for a follow up scan in 3 months or additional testing based on our provider's assessment of the scan. We will be in touch to make sure you have no questions and to schedule the recommended 3 month follow up scan.  Lung RADS 4 B:  indicates findings that are concerning. We will be in touch with you to schedule additional diagnostic testing based on our provider's  assessment of the scan.  Other options for assistance in smoking cessation (   As covered by your insurance benefits)  Hypnosis for smoking cessation  [employer] [phone]  Acupuncture for smoking cessation  [facility] [phone]   

## 2021-09-17 NOTE — Progress Notes (Signed)
Virtual Visit via Telephone Note  I connected with [name] on 09/17/21 at 10:30 AM EDT by telephone and verified that I am speaking with the correct person using two identifiers.  Location: Patient:  At home Provider: [place], [place], [place], [address]    I discussed the limitations, risks, security and privacy concerns of performing an evaluation and management service by telephone and the availability of in person appointments. I also discussed with the patient that there may be a patient responsible charge related to this service. The patient expressed understanding and agreed to proceed.   Shared Decision Making Visit Lung Cancer Screening Program [phone])   Eligibility: Age 56 y.o. Pack Years Smoking History Calculation 50 pack year smoking history (# packs/per year x # years smoked) Recent History of coughing up blood  no Unexplained weight loss? no ( >Than 15 pounds within the last 6 months ) Prior History Lung / other cancer no (Diagnosis within the last 5 years already requiring surveillance chest CT Scans). Smoking Status Current Smoker Former Smokers: Years since quit:  NA  Quit Date:  NA  Visit Components: Discussion included one or more decision making aids. yes Discussion included risk/benefits of screening. yes Discussion included potential follow up diagnostic testing for abnormal scans. yes Discussion included meaning and risk of over diagnosis. yes Discussion included meaning and risk of False Positives. yes Discussion included meaning of total radiation exposure. yes  Counseling Included: Importance of adherence to annual lung cancer LDCT screening. yes Impact of comorbidities on ability to participate in the program. yes Ability and willingness to under diagnostic treatment. yes  Smoking Cessation Counseling: Current Smokers:  Discussed importance of smoking cessation. yes Information about tobacco cessation classes and  interventions provided to patient. yes Patient provided with "ticket" for LDCT Scan. yes Symptomatic Patient. no  Counseling NA Diagnosis Code: Tobacco Use Z72.0 Asymptomatic Patient yes  Counseling (Intermediate counseling: > three minutes counseling) E2683 Former Smokers:  Discussed the importance of maintaining cigarette abstinence. yes Diagnosis Code: Personal History of Nicotine Dependence. M19.622 Information about tobacco cessation classes and interventions provided to patient. Yes Patient provided with "ticket" for LDCT Scan. yes Written Order for Lung Cancer Screening with LDCT placed in Epic. Yes (CT Chest Lung Cancer Screening Low Dose W/O CM) [id number] Z12.2-Screening of respiratory organs Z87.891-Personal history of nicotine dependence  I have spent 25 minutes of face to face/ virtual visit   time with Ms. Fromer discussing the risks and benefits of lung cancer screening. We viewed / discussed a power point together that explained in detail the above noted topics. We paused at intervals to allow for questions to be asked and answered to ensure understanding.We discussed that the single most powerful action that she can take to decrease her risk of developing lung cancer is to quit smoking. We discussed whether or not she is ready to commit to setting a quit date. We discussed options for tools to aid in quitting smoking including nicotine replacement therapy, non-nicotine medications, support groups, Quit Smart classes, and behavior modification. We discussed that often times setting smaller, more achievable goals, such as eliminating 1 cigarette a day for a week and then 2 cigarettes a day for a week can be helpful in slowly decreasing the number of cigarettes smoked. This allows for a sense of accomplishment as well as providing a clinical benefit. I provided  her  with smoking cessation  information  with contact information for community resources, classes, free nicotine  replacement  therapy, and access to mobile apps, text messaging, and on-line smoking cessation help. I have also provided  her  the office contact information in the event she needs to contact me, or the screening staff. We discussed the time and location of the scan, and that either Doroteo Glassman RN, Joella Prince, RN  or I will call / send a letter with the results within 24-72 hours of receiving them. The patient verbalized understanding of all of  the above and had no further questions upon leaving the office. They have my contact information in the event they have any further questions.  I spent 3 minutes counseling on smoking cessation and the health risks of continued tobacco abuse.  I explained to the patient that there has been a high incidence of coronary artery disease noted on these exams. I explained that this is a non-gated exam therefore degree or severity cannot be determined. This patient is not on statin therapy. I have asked the patient to follow-up with their PCP regarding any incidental finding of coronary artery disease and management with diet or medication as their PCP  feels is clinically indicated. The patient verbalized understanding of the above and had no further questions upon completion of the visit.      Magdalen Spatz, NP 09/17/2021

## 2021-09-18 NOTE — Progress Notes (Signed)
Office Visit Note  Patient: Tina Watkins             Date of Birth: 09/13/1965           MRN: [id number]             PCP: Teena Dunk, NP Referring: Teena Dunk, NP Visit Date: 09/26/2021   Subjective:   History of Present Illness: JENICE LEINER is a 56 y.o. female here for follow up or evaluation of pain with history of neuropathy, gout, osteoarthritis, and possible fibromyalgia syndrome.  She is not experiencing any major flareups with the toe inflammation or swelling since her last visit.  Does still continue having considerable pain on a daily basis at multiple sites including lower extremities hands shoulders neck and back.  Bilateral shoulder pains were improved with the steroid injections at her last visit but has experienced worsening again with stiffness and difficulty in range of motion.  Previous HPI 07/07/2021 LENIYA BREIT is a 56 y.o. female here for follow up for evaluation of pain with history of neuropathy, gout, osteoarthritis, and possible fibromyalgia syndrome.  At initial visit uric acid level was 3.2 suspect gout is well controlled at this time. Imaging of hands and shoulders did not show significant shoulder arthritis but hands with chronic changes including probable small distal erosions. She continues to have a lot of pain also feeling numbness and swelling involving both hands. She is unable to easily reach up or behind her back, also with increased pain lying on her side provokes worse numbness to that side.   Previous HPI 06/06/2021 LATIMA HAMZA is a 56 y.o. female here for evaluation of pain with history of neuropathy, gout, osteoarthritis, and possible fibromyalgia syndrome. 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 She has joint pain and stiffness in a lot of areas. Muscle pains in neck and upper back.  Large joint pains have been bothering her for years varying in  severity.  Shoulders and back are doing worse particularly in the past month.  Episodic gout flares have caused severe pain in first MTP joints.  Most recently gout flare in the left great toe about a week ago improved with colchicine treatment.  However on a routine basis having more symptoms in the bilateral hands and shoulders.  She was started on duloxetine 30 mg for lower extremity neuropathy pain. On allopurinol 100 mg daily for gout. Previous knee xray and MRI findings with chondrocalcinosis and bone infarcts. Right hip imaging with AVN.   Review of Systems  Constitutional:  Positive for fatigue.  HENT:  Negative for mouth sores and mouth dryness.   Eyes:  Negative for dryness.  Respiratory:  Positive for shortness of breath.   Cardiovascular:  Positive for chest pain and palpitations.  Gastrointestinal:  Negative for blood in stool, constipation and diarrhea.  Endocrine: Negative for increased urination.  Genitourinary:  Negative for involuntary urination.  Musculoskeletal:  Positive for joint pain, gait problem, joint pain, joint swelling, myalgias, muscle weakness, morning stiffness, muscle tenderness and myalgias.  Skin:  Positive for hair loss. Negative for color change, rash and sensitivity to sunlight.  Allergic/Immunologic: Negative for susceptible to infections.  Neurological:  Positive for dizziness and headaches.  Hematological:  Negative for swollen glands.  Psychiatric/Behavioral:  Positive for depressed mood, suicidal ideas, self-injury and sleep disturbance. The patient is nervous/anxious.     PMFS History:  Patient Active  Problem List   Diagnosis Date Noted   Prolonged Q-T interval on ECG 09/07/2021   Nausea & vomiting 09/06/2021   Bilateral shoulder pain 06/06/2021   Bilateral hand swelling 06/06/2021   Cervical radiculitis 01/24/2021   Acute respiratory failure with hypoxia ([place]) 10/23/2020   Coagulation defect, unspecified ([place]) 08/01/2020   Aortic  atherosclerosis ([place]) 07/10/2020   Secondary hypercoagulable state ([place]) 04/23/2020   Atrial flutter ([place]) 03/29/2020   Chronic pain of both knees 03/13/2020   Chills (without fever) 12/12/2019   Other pancytopenia ([place])    Decubitus ulcer of sacral region, stage 1 08/18/2019   HCAP (healthcare-associated pneumonia) 08/05/2019   Pressure injury of skin 07/24/2019   Chest congestion 07/24/2019   Itching 07/24/2019   Weakness 06/13/2019   Pneumonia due to COVID-19 virus 05/09/2019   Hypotension 05/09/2019   Symptomatic anemia 05/09/2019   CHF (congestive heart failure) ([place]) 05/09/2019   Swollen abdomen 01/04/2019   DDD (degenerative disc disease), cervical 12/06/2018   Neuropathy 12/06/2018   Paresthesia 10/11/2018   Neck pain 10/11/2018   Tobacco abuse 11/20/2017   Right leg swelling 11/20/2017   Right leg pain 11/20/2017   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   GERD (gastroesophageal reflux disease) 11/20/2017   Acute venous embolism and thrombosis of deep vessels of proximal lower extremity, right ([place]) 11/20/2017   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    Thrush, oral    AKI (acute kidney injury) ([place])    Multifocal pneumonia 08/09/2017   ETD (Eustachian tube dysfunction), bilateral 08/03/2017   Acute recurrent pansinusitis 07/21/2017   Chest pain 09/29/2015   Cervical stenosis of spinal canal 01/08/2015   Urinary tract infectious disease    Muscle spasms of neck 06/15/2014   Bleeding hemorrhoid 06/15/2014   Anemia in chronic kidney disease 06/15/2014   Pyelonephritis, acute 06/13/2014   Sepsis ([place]) 06/13/2014   History of kidney transplant    Abnormal CT scan 09/14/2012   Chronic pain syndrome 04/09/2012   Dehydration, mild 04/09/2012   Herpes infection 06/23/2011   Anxiety 06/23/2011   E coli bacteremia 06/18/2011   ESRD (end stage renal disease) ([place]) 06/12/2011   UTI (lower urinary tract infection) 06/12/2011    Bacteremia due to Gram-negative bacteria 05/23/2011   History of renal transplantation 05/22/2011   Septic shock(785.52) 05/22/2011   Acute on chronic kidney failure ([place]) 05/22/2011   Gastroparesis 04/24/2008   WEIGHT LOSS 08/24/2007   NAUSEA AND VOMITING 08/24/2007    Past Medical History:  Diagnosis Date   Bacteremia due to Gram-negative bacteria 05/23/2011   Blind    right eye   Blind right eye    CHF (congestive heart failure) ([facility])    Chronic lower back pain    Complication of anesthesia    "woke up during OR; I have an extremely high tolerance" (12/11/2011) 1 procedure was graft; the other procedures were procedures that are typically done with sedation.   DDD (degenerative disc disease), cervical    Depression    Dysrhythmia    "tachycardia" (12/11/2011) new onset afib 10/15/14 EKG   E coli bacteremia 06/18/2011   Elevated LDL cholesterol level 12/2018   ESRD (end stage renal disease) ([place]) 06/12/2011   Fibromyalgia    Gastroparesis    Gastroparesis    GERD (gastroesophageal reflux disease)    Glaucoma    right eye   Gout    H/O carpal tunnel syndrome    Headache 10/2019   Headache(784.0)    "  not often anymore" (12/11/2011)   Herpes genitalia 1994   History of blood transfusion    "more than a few times" (12/11/2011)   History of stomach ulcers    Hypotension    Iron deficiency anemia    New onset a-fib ([place])    10/15/14 EKG   Osteopenia    Peripheral neuropathy 11/2018   Pressure ulcer of sacral region, stage 1 07/2019   Seizures ([place]) 1994   "post transplant; only have had that one" (12/11/2011)   Spinal stenosis in cervical region    Stroke [facility])     left basal ganglia lacunar infarct; Right frontal lobe lacunar infarct.   Stroke [facility]) ~ 1999; 2001   "briefly lost my vision; lost my right eye" (12/11/2011)   Vitamin D deficiency 12/2018    Family History  Problem Relation Age of Onset   Glaucoma Mother    Pancreatic cancer Father    Multiple sclerosis  Brother    Diabetes Maternal Uncle    Hypertension Maternal Grandmother    Breast cancer Neg Hx    Past Surgical History:  Procedure Laterality Date   ANTERIOR CERVICAL DECOMP/DISCECTOMY FUSION N/A 01/08/2015   Procedure: Anterior Cervical Three-Four/Four-Five Decompression/Diskectomy/Fusion;  Surgeon: Leeroy Cha, MD;  Location: [facility];  Service: Neurosurgery;  Laterality: N/A;  C3-4 C4-5 Anterior cervical decompression/diskectomy/fusion   APPENDECTOMY  ~ 2004   BIOPSY  09/12/2020   Procedure: BIOPSY;  Surgeon: Milus Banister, MD;  Location: [facility];  Service: Endoscopy;;   CATARACT EXTRACTION     right eye   COLONOSCOPY     COLONOSCOPY WITH PROPOFOL N/A 09/12/2020   Procedure: COLONOSCOPY WITH PROPOFOL;  Surgeon: Milus Banister, MD;  Location: [facility];  Service: Endoscopy;  Laterality: N/A;   ENUCLEATION  2001   "right"   ESOPHAGOGASTRODUODENOSCOPY (EGD) WITH PROPOFOL N/A 04/21/2012   Procedure: ESOPHAGOGASTRODUODENOSCOPY (EGD) WITH PROPOFOL;  Surgeon: Milus Banister, MD;  Location: [facility];  Service: Endoscopy;  Laterality: N/A;   ESOPHAGOGASTRODUODENOSCOPY (EGD) WITH PROPOFOL N/A 09/12/2020   Procedure: ESOPHAGOGASTRODUODENOSCOPY (EGD) WITH PROPOFOL;  Surgeon: Milus Banister, MD;  Location: [facility];  Service: Endoscopy;  Laterality: N/A;   INSERTION OF DIALYSIS CATHETER  1988   "AV graft LUA & LFA; LUA worked for 1 day; LFA never worked"   IR FLUORO GUIDE CV LINE RIGHT  07/11/2019   IR FLUORO GUIDE CV LINE RIGHT  10/20/2019   IR FLUORO GUIDE CV LINE RIGHT  05/31/2020   IR PTA VENOUS EXCEPT DIALYSIS CIRCUIT  05/31/2020   IR RADIOLOGY PERIPHERAL GUIDED IV START  07/11/2019   IR US GUIDE VASC ACCESS RIGHT  07/11/2019   IR US GUIDE VASC ACCESS RIGHT  07/11/2019   IR US GUIDE VASC ACCESS RIGHT  07/11/2019   IR VENOCAVAGRAM IVC  05/31/2020   KIDNEY TRANSPLANT  1994; 1999; 2005   "right"   MULTIPLE TOOTH EXTRACTIONS     POLYPECTOMY  09/12/2020   Procedure:  POLYPECTOMY;  Surgeon: Milus Banister, MD;  Location: [facility];  Service: Endoscopy;;   RIGHT HEART CATH N/A 03/23/2019   Procedure: RIGHT HEART CATH;  Surgeon: Jolaine Artist, MD;  Location: [facility];  Service: Cardiovascular;  Laterality: N/A;   [place]?; 1994; 2005   Social History   Social History Narrative   Right-handed.   Four cups caffeine per day.   Lives alone.   Immunization History  Administered Date(s) Administered  Hepatitis B, PED/ADOLESCENT 07/20/2019, 08/29/2019, 09/21/2019   Hepb-cpg 07/20/2019, 08/29/2019, 09/21/2019, 11/23/2019   Influenza Split 11/07/2019   Influenza,inj,Quad PF,6+ Mos 11/07/2019, 11/07/2020   PFIZER Comirnaty(Gray Top)Covid-19 Tri-Sucrose Vaccine 07/29/2020   PFIZER(Purple Top)SARS-COV-2 Vaccination 05/31/2019, 06/21/2019, 10/26/2019   PPD Test 08/10/2017   Pfizer Covid-19 Vaccine Bivalent Booster 83yr & up 02/06/2021     Objective: Vital Signs: BP 92/65 (BP Location: Left Arm, Patient Position: Sitting, Cuff Size: Normal)   Pulse 91   Resp 14   Ht '4\' 11"'$  (1.499 m)   Wt 110 lb 9.6 oz (50.2 kg)   BMI 22.34 kg/m    Physical Exam Eyes:     Comments: Right eye blindness, left eye mild conjunctival injection  Cardiovascular:     Rate and Rhythm: Normal rate and regular rhythm.  Pulmonary:     Effort: Pulmonary effort is normal.     Breath sounds: Normal breath sounds.  Musculoskeletal:     Right lower leg: No edema.     Left lower leg: No edema.  Skin:    General: Skin is warm and dry.     Comments: Hyperpigmentation and skin thickening of bilateral lower extremities  Neurological:     Mental Status: She is alert.     Motor: No weakness.  Psychiatric:     Comments: Tearful discussing symptoms      Musculoskeletal Exam:  Shoulders tenderness to pressure laterally, decreased ROM with guarding against movement in all planes Elbows full ROM no tenderness or swelling Wrists  full ROM no tenderness or swelling Fingers 1st IP joints widening, mild triggering and tenderness to pressure Knees full ROM no tenderness or swelling Ankles decreased ROM no tenderness or joint swelling   Investigation: No additional findings.  Imaging: No results found.  Recent Labs: Lab Results  Component Value Date   WBC 4.9 10/10/2021   HGB 10.6 (L) 10/10/2021   PLT 172 10/10/2021   NA 139 10/10/2021   K 4.0 10/10/2021   CL 100 10/10/2021   CO2 21 (L) 10/10/2021   GLUCOSE 71 10/10/2021   BUN 34 (H) 10/10/2021   CREATININE 5.16 (H) 10/10/2021   BILITOT 0.8 10/10/2021   ALKPHOS 140 (H) 10/10/2021   AST 26 10/10/2021   ALT 25 10/10/2021   PROT 7.2 10/10/2021   ALBUMIN 4.4 10/10/2021   CALCIUM 10.6 (H) 10/10/2021   GFRAA 13 (L) 10/13/2019    Speciality Comments: No specialty comments available.  Procedures:  Large Joint Inj: bilateral subacromial bursa on 09/26/2021 12:20 PM Indications: pain Details: 25 G 1.5 in needle, lateral approach Medications (Right): 3 mL lidocaine 1 %; 40 mg triamcinolone acetonide 40 MG/ML Medications (Left): 3 mL lidocaine 1 %; 40 mg triamcinolone acetonide 40 MG/ML Outcome: tolerated well, no immediate complications Procedure, treatment alternatives, risks and benefits explained, specific risks discussed. Consent was given by the patient. Immediately prior to procedure a time out was called to verify the correct patient, procedure, equipment, support staff and site/side marked as required. Patient was prepped and draped in the usual sterile fashion.     Allergies: Levofloxacin and Tape   Assessment / Plan:     Visit Diagnoses: Chronic pain of both shoulders - Plan: Large Joint Inj: bilateral subacromial bursa  Pain and decreased mobility significantly limiting her function.  Unfortunately not a good candidate for lots of systemic medication with end-stage renal disease.  We will try repeat of subacromial bursa steroid injections today.   If symptoms are not improved well or not  having lasting benefit may need evaluation with orthopedic specialist or consider MRI imaging to identify any underlying structural causes although may be limited in recommended interventions.  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  She does still have some chronic joint pains and stiffness.  Still retains suspicion for gouty arthritis versus calcium crystal arthritis.  Orders: Orders Placed This Encounter  Procedures   Large Joint Inj: bilateral subacromial bursa   No orders of the defined types were placed in this encounter.    Follow-Up Instructions: Return in about 3 months (around 12/27/2021) for Gout/OA/shoulder MRI f/u 65mo.   CCollier Salina MD  Note - This record has been created using [employer]  Chart creation errors have been sought, but may not always  have been located. Such creation errors do not reflect on  the standard of medical care.

## 2021-09-19 ENCOUNTER — Ambulatory Visit: Payer: Medicare Other

## 2021-09-19 DIAGNOSIS — M6281 Muscle weakness (generalized): Secondary | ICD-10-CM

## 2021-09-19 DIAGNOSIS — M25612 Stiffness of left shoulder, not elsewhere classified: Secondary | ICD-10-CM

## 2021-09-19 DIAGNOSIS — M25611 Stiffness of right shoulder, not elsewhere classified: Secondary | ICD-10-CM

## 2021-09-19 DIAGNOSIS — G8929 Other chronic pain: Secondary | ICD-10-CM

## 2021-09-19 DIAGNOSIS — M25512 Pain in left shoulder: Secondary | ICD-10-CM | POA: Diagnosis not present

## 2021-09-19 NOTE — Therapy (Deleted)
OUTPATIENT PHYSICAL THERAPY TREATMENT NOTE RENEWAL   Patient Name: Tina Watkins MRN: [id number] DOB:Nov 02, 1965, 56 y.o., female Today's Date: 09/19/2021  PCP: Bo Merino I, NP REFERRING PROVIDER: Collier Salina, MD  END OF SESSION:          Past Medical History:  Diagnosis Date   Bacteremia due to Gram-negative bacteria 05/23/2011   Blind    right eye   Blind right eye    CHF (congestive heart failure) ([place])    Chronic lower back pain    Complication of anesthesia    "woke up during OR; I have an extremely high tolerance" (12/11/2011) 1 procedure was graft; the other procedures were procedures that are typically done with sedation.   DDD (degenerative disc disease), cervical    Depression    Dysrhythmia    "tachycardia" (12/11/2011) new onset afib 10/15/14 EKG   E coli bacteremia 06/18/2011   Elevated LDL cholesterol level 12/2018   ESRD (end stage renal disease) ([place]) 06/12/2011   Fibromyalgia    Gastroparesis    Gastroparesis    GERD (gastroesophageal reflux disease)    Glaucoma    right eye   Gout    H/O carpal tunnel syndrome    Headache 10/2019   Headache(784.0)    "not often anymore" (12/11/2011)   Herpes genitalia 1994   History of blood transfusion    "more than a few times" (12/11/2011)   History of stomach ulcers    Hypotension    Iron deficiency anemia    New onset a-fib ([place])    10/15/14 EKG   Osteopenia    Peripheral neuropathy 11/2018   Pressure ulcer of sacral region, stage 1 07/2019   Seizures ([place]) 1994   "post transplant; only have had that one" (12/11/2011)   Spinal stenosis in cervical region    Stroke [facility])     left basal ganglia lacunar infarct; Right frontal lobe lacunar infarct.   Stroke [facility]) ~ 1999; 2001   "briefly lost my vision; lost my right eye" (12/11/2011)   Vitamin D deficiency 12/2018   Past Surgical History:  Procedure Laterality Date   ANTERIOR CERVICAL DECOMP/DISCECTOMY FUSION N/A 01/08/2015   Procedure:  Anterior Cervical Three-Four/Four-Five Decompression/Diskectomy/Fusion;  Surgeon: Leeroy Cha, MD;  Location: [facility];  Service: Neurosurgery;  Laterality: N/A;  C3-4 C4-5 Anterior cervical decompression/diskectomy/fusion   APPENDECTOMY  ~ 2004   BIOPSY  09/12/2020   Procedure: BIOPSY;  Surgeon: Milus Banister, MD;  Location: [facility];  Service: Endoscopy;;   CATARACT EXTRACTION     right eye   COLONOSCOPY     COLONOSCOPY WITH PROPOFOL N/A 09/12/2020   Procedure: COLONOSCOPY WITH PROPOFOL;  Surgeon: Milus Banister, MD;  Location: [facility];  Service: Endoscopy;  Laterality: N/A;   ENUCLEATION  2001   "right"   ESOPHAGOGASTRODUODENOSCOPY (EGD) WITH PROPOFOL N/A 04/21/2012   Procedure: ESOPHAGOGASTRODUODENOSCOPY (EGD) WITH PROPOFOL;  Surgeon: Milus Banister, MD;  Location: [facility];  Service: Endoscopy;  Laterality: N/A;   ESOPHAGOGASTRODUODENOSCOPY (EGD) WITH PROPOFOL N/A 09/12/2020   Procedure: ESOPHAGOGASTRODUODENOSCOPY (EGD) WITH PROPOFOL;  Surgeon: Milus Banister, MD;  Location: [facility];  Service: Endoscopy;  Laterality: N/A;   INSERTION OF DIALYSIS CATHETER  1988   "AV graft LUA & LFA; LUA worked for 1 day; LFA never worked"   IR FLUORO GUIDE CV LINE RIGHT  07/11/2019   IR FLUORO GUIDE CV LINE RIGHT  10/20/2019   IR FLUORO GUIDE CV LINE RIGHT  05/31/2020   IR  PTA VENOUS EXCEPT DIALYSIS CIRCUIT  05/31/2020   IR RADIOLOGY PERIPHERAL GUIDED IV START  07/11/2019   IR US GUIDE VASC ACCESS RIGHT  07/11/2019   IR US GUIDE VASC ACCESS RIGHT  07/11/2019   IR US GUIDE VASC ACCESS RIGHT  07/11/2019   IR [place] IVC  05/31/2020   KIDNEY TRANSPLANT  1994; 1999; 2005   "right"   MULTIPLE TOOTH EXTRACTIONS     POLYPECTOMY  09/12/2020   Procedure: POLYPECTOMY;  Surgeon: Milus Banister, MD;  Location: [facility];  Service: Endoscopy;;   RIGHT HEART CATH N/A 03/23/2019   Procedure: RIGHT HEART CATH;  Surgeon: Jolaine Artist, MD;  Location: [facility];  Service:  Cardiovascular;  Laterality: N/A;   [place]?; 1994; 2005   Patient Active Problem List   Diagnosis Date Noted   Prolonged Q-T interval on ECG 09/07/2021   Nausea & vomiting 09/06/2021   Bilateral shoulder pain 06/06/2021   Bilateral hand swelling 06/06/2021   Cervical radiculitis 01/24/2021   Acute respiratory failure with hypoxia ([place]) 10/23/2020   Coagulation defect, unspecified ([place]) 08/01/2020   Aortic atherosclerosis ([place]) 07/10/2020   Secondary hypercoagulable state ([employer]) 04/23/2020   Atrial flutter ([place]) 03/29/2020   Chronic pain of both knees 03/13/2020   Chills (without fever) 12/12/2019   Other pancytopenia ([place])    Decubitus ulcer of sacral region, stage 1 08/18/2019   HCAP (healthcare-associated pneumonia) 08/05/2019   Pressure injury of skin 07/24/2019   Chest congestion 07/24/2019   Itching 07/24/2019   Weakness 06/13/2019   Pneumonia due to COVID-19 virus 05/09/2019   Hypotension 05/09/2019   Symptomatic anemia 05/09/2019   CHF (congestive heart failure) ([place]) 05/09/2019   Swollen abdomen 01/04/2019   DDD (degenerative disc disease), cervical 12/06/2018   Neuropathy 12/06/2018   Paresthesia 10/11/2018   Neck pain 10/11/2018   Tobacco abuse 11/20/2017   Right leg swelling 11/20/2017   Right leg pain 11/20/2017   Stroke (cerebrum) ([place]) 11/20/2017   GERD (gastroesophageal reflux disease) 11/20/2017   Acute venous embolism and thrombosis of deep vessels of proximal lower extremity, right ([place]) 11/20/2017   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    Thrush, oral    AKI (acute kidney injury) ([place])    Multifocal pneumonia 08/09/2017   ETD (Eustachian tube dysfunction), bilateral 08/03/2017   Acute recurrent pansinusitis 07/21/2017   Chest pain 09/29/2015   Cervical stenosis of spinal canal 01/08/2015   Urinary tract infectious disease    Muscle spasms of neck 06/15/2014   Bleeding  hemorrhoid 06/15/2014   Anemia in chronic kidney disease 06/15/2014   Pyelonephritis, acute 06/13/2014   Sepsis ([place]) 06/13/2014   History of kidney transplant    Abnormal CT scan 09/14/2012   Chronic pain syndrome 04/09/2012   Dehydration, mild 04/09/2012   Herpes infection 06/23/2011   Anxiety 06/23/2011   E coli bacteremia 06/18/2011   ESRD (end stage renal disease) ([place]) 06/12/2011   UTI (lower urinary tract infection) 06/12/2011   Bacteremia due to Gram-negative bacteria 05/23/2011   History of renal transplantation 05/22/2011   Septic shock(785.52) 05/22/2011   Acute on chronic kidney failure ([place]) 05/22/2011   Gastroparesis 04/24/2008   WEIGHT LOSS 08/24/2007   NAUSEA AND VOMITING 08/24/2007    REFERRING DIAG: M25.511,G89.29,M25.512 (ICD-10-CM) - Chronic pain of both shoulders  THERAPY DIAG:  No diagnosis found.  Rationale for Evaluation and Treatment Rehabilitation  SUBJECTIVE:  SUBJECTIVE STATEMENT: My L shoulder is bothering me a lot today    PERTINENT HISTORY DDD cervical, fibromyalgia, spinal stenosis cervical, CHF   PAIN:  Are you having pain? Yes: NPRS scale: L 10/10, R 8/10 Pain location: Upper shoulders to [place] jt to upper arms Pain description: ache and throb, dull , spasms  Aggravating factors: Just using my arms Relieving factors: Meds, massage , heat  Pain range: 5-9/10  PERTINENT HISTORY: DDD cervical, fibromyalgia, spinal stenosis cervical, CHF   PRECAUTIONS: None   PATIENT GOALS More strength and mobility of her  shoulders   DIAGNOSTIC FINDINGS:  X-ray right shoulder 4 views 06/20/21  Glenohumeral joint space appears normal.  Normal internal and external  rotation of humerus.  AC joint space appears normal.  No visible erosions  or abnormal calcifications seen.    Impression   Normal appearing shoulder x-ray   X-ray left shoulder 4 views 06/20/21  Glenohumeral joint space appears normal.  Normal internal and external  rotation of humerus.  AC joint space appears normal.  No visible erosions  or abnormal calcifications seen.   Impression   Normal appearing shoulder x-ray   PATIENT SURVEYS:  FOTO 58% predicted 64%   COGNITION:           Overall cognitive status: Within functional limits for tasks assessed                                  SENSATION: WFL   POSTURE: Forward head and rounded shoulders   UPPER EXTREMITY ROM:     AROM/AAROM/PROM Right eval Left eval Rt AROM 08/06/21 L AROM 08/06/21 R Arom 08/20/21 L Arom 08/20/21 R AROM 09/03/21 L ARO8/2/25M  Shoulder flexion 85/118/115 88/110/110 105 107 110 110 101 100  Shoulder extension            Shoulder abduction            Shoulder adduction            Shoulder internal rotation            Shoulder external rotation            Elbow flexion            Elbow extension            Wrist flexion            Wrist extension            Wrist ulnar deviation            Wrist radial deviation            Wrist pronation            Wrist supination            (Blank rows = not tested)   UPPER EXTREMITY MMT: Tested strength in a neutral position due to pain. Pt demonstrated strength against resistance seeming at least 3/5. No overt weakness noted. MMT Right 7/17 Left 7/17 Rt 09/03/21 Lt 09/03/21  Shoulder flexion  2+  3- 3 3  Shoulder extension        Shoulder abduction     3 3  Shoulder adduction        Shoulder internal rotation  4-  _0 Shoulder external rotation  3  3+ 3+ 3+  Middle trapezius        Lower trapezius  Elbow flexion        Elbow extension        Wrist flexion        Wrist extension        Wrist ulnar deviation        Wrist radial deviation        Wrist pronation        Wrist supination        Grip strength (lbs)        (Blank rows = not  tested)   SHOULDER SPECIAL TESTS:            Impingement tests: Hawkins/Kennedy impingement test: positive             Rotator cuff assessment: Empty can test: positive  and Full can test: positive             Both tests were painful            Biceps assessment: Yergason's test: positive    PALPATION:  TTP to the upper trap and peri-GH area             TODAY'S TREATMENT:  [facility] Adult PT Treatment:                                                DATE: 09/19/21 Therapeutic Exercise: Pulleys standing facing anchor overhead NuStep UEs > LEs for 5 min  Isometric ex for flex, abd, ext x5 10" Manual Therapy: PROM all planes bilaterally with light distraction in various angles Oscillation and GH mobs to R/L shoulder  Modalities  [facility] Adult PT Treatment:                                                DATE: 09/05/21 Therapeutic Exercise: Pulleys standing facing anchor overhead NuStep UEs > LEs for 5 min  Supine cane chest press x 10  Overhead lift x 10  Manual Therapy: PROM all planes bilaterally with light distraction in various angles PROM  Flexion 115-120  Abduction 95 deg ER 40 deg IR 60 deg  Oscillation and GH mobs to R/L shoulder  Self care: POC, progress, FOTO score  [facility] Adult PT Treatment:                                                DATE: 09/03/21 Shoulder pulley flexion 2 mins Shoulder row 2x10 YTB Shoulder ext 2x10 YTB Pendulum flex and ext c 4# Table slides for UE flexion x 10, scaption  x 10 each Bilat shoulder ER YTB x10, min ROM  Supine chest press AAROM shoulder flexion  x 10, slow and painful  Manual Therapy: PROM all planes bilaterally with light distraction in various angles   PATIENT EDUCATION: Education details: HEP, POC,  Person educated: Patient Education method: Explanation, Demonstration, Tactile cues, Verbal cues, and Handouts Education comprehension: verbalized understanding, returned demonstration, verbal cues required, and tactile cues  required     HOME EXERCISE PROGRAM: Access Code: [email or web address]   ASSESSMENT:   CLINICAL IMPRESSION: PT was completed for bilat shoulder mobility and strengthening. Strengthening was completed per  isometrics to minimize pain. Manual therapy for shoulder jt mobs c oscillations and moist heat was provided for pain management  OBJECTIVE IMPAIRMENTS decreased activity tolerance, decreased ROM, decreased strength, increased fascial restrictions, increased muscle spasms, impaired UE functional use, postural dysfunction, and pain.    ACTIVITY LIMITATIONS carrying, lifting, bending, sleeping, bed mobility, bathing, toileting, dressing, reach over head, hygiene/grooming, and caring for others   PARTICIPATION LIMITATIONS: meal prep, cleaning, laundry, driving, shopping, community activity, and yard work   PERSONAL FACTORS Past/current experiences, Time since onset of injury/illness/exacerbation, and 3+ comorbidities:    DDD cervical, fibromyalgia, spinal stenosis cervical are also affecting patient's functional outcome.    REHAB POTENTIAL: Fair due to chronicity   CLINICAL DECISION MAKING: Evolving/moderate complexity   EVALUATION COMPLEXITY: Moderate     GOALS:     LONG TERM GOALS: Target date: 09/05/21    Pt's shoulder AROM will increase to R 105d and L 100d for improved shoulder function Baseline: about 90 deg today Status: Lt 110 deg, Rt 116 deg Goal status: MET   2.  Pt will demonstrate 4/5 shoulder strength for improved shoulder function Baseline: 3/5 to 3+/5    STATUS: see flow sheet Goal status: MET   3.  Pt's FOTO score will improve to 64% as indication of improved function Baseline: 48% now 55% on Re-assess Goal status:ongoing    4.  Pt will be Ind in a HEP to maintain achieved LOF Baseline:  Status: Verbal cueing for proper completion  Goal status: ongoing    PLAN: PT FREQUENCY: 2x/week   PT DURATION: 3 weeks   PLANNED INTERVENTIONS: Therapeutic exercises, Therapeutic  activity, Patient/Family education, Joint mobilization, Aquatic Therapy, Dry Needling, Cryotherapy, Moist heat, Taping, Ultrasound, Ionotophoresis 56m/ml Dexamethasone, Manual therapy, and Re-evaluation   PLAN FOR NEXT SESSION:  ROM, as tolerated. Pulleys, table slides . use of modalities, manual therapy and TPDN as inidcated. Reassess FMount JoyMS, PT 09/19/21 10:07 AM

## 2021-09-19 NOTE — Therapy (Signed)
OUTPATIENT PHYSICAL THERAPY    Patient Name: Tina Watkins MRN: [id number] DOB:02-14-1965, 56 y.o., female Today's Date: 09/19/2021  PCP: Kathrynn Speed, NP REFERRING PROVIDER: Fuller Plan, MD  END OF SESSION:   PT End of Session - 09/19/21 0856     Visit Number 8    Number of Visits 13    Date for PT Re-Evaluation 10/03/21    Authorization Type MEDICARE PART A AND B; MEDICAID [place] ACCESS    PT Start Time [phone]    PT Stop Time 0945    PT Time Calculation (min) 50 min    Activity Tolerance Patient tolerated treatment well;Patient limited by pain    Behavior During Therapy [facility] for tasks assessed/performed                   Past Medical History:  Diagnosis Date   Bacteremia due to Gram-negative bacteria 05/23/2011   Blind    right eye   Blind right eye    CHF (congestive heart failure) (HCC)    Chronic lower back pain    Complication of anesthesia    "woke up during OR; I have an extremely high tolerance" (12/11/2011) 1 procedure was graft; the other procedures were procedures that are typically done with sedation.   DDD (degenerative disc disease), cervical    Depression    Dysrhythmia    "tachycardia" (12/11/2011) new onset afib 10/15/14 EKG   E coli bacteremia 06/18/2011   Elevated LDL cholesterol level 12/2018   ESRD (end stage renal disease) (HCC) 06/12/2011   Fibromyalgia    Gastroparesis    Gastroparesis    GERD (gastroesophageal reflux disease)    Glaucoma    right eye   Gout    H/O carpal tunnel syndrome    Headache 10/2019   Headache(784.0)    "not often anymore" (12/11/2011)   Herpes genitalia 1994   History of blood transfusion    "more than a few times" (12/11/2011)   History of stomach ulcers    Hypotension    Iron deficiency anemia    New onset a-fib (HCC)    10/15/14 EKG   Osteopenia    Peripheral neuropathy 11/2018   Pressure ulcer of sacral region, stage 1 07/2019   Seizures (HCC) 1994   "post transplant; only have  had that one" (12/11/2011)   Spinal stenosis in cervical region    Stroke [facility])     left basal ganglia lacunar infarct; Right frontal lobe lacunar infarct.   Stroke [facility]) ~ 1999; 2001   "briefly lost my vision; lost my right eye" (12/11/2011)   Vitamin D deficiency 12/2018   Past Surgical History:  Procedure Laterality Date   ANTERIOR CERVICAL DECOMP/DISCECTOMY FUSION N/A 01/08/2015   Procedure: Anterior Cervical Three-Four/Four-Five Decompression/Diskectomy/Fusion;  Surgeon: Hilda Lias, MD;  Location: [facility] NEURO ORS;  Service: Neurosurgery;  Laterality: N/A;  C3-4 C4-5 Anterior cervical decompression/diskectomy/fusion   APPENDECTOMY  ~ 2004   BIOPSY  09/12/2020   Procedure: BIOPSY;  Surgeon: Rachael Fee, MD;  Location: [facility] ENDOSCOPY;  Service: Endoscopy;;   CATARACT EXTRACTION     right eye   COLONOSCOPY     COLONOSCOPY WITH PROPOFOL N/A 09/12/2020   Procedure: COLONOSCOPY WITH PROPOFOL;  Surgeon: Rachael Fee, MD;  Location: [facility] ENDOSCOPY;  Service: Endoscopy;  Laterality: N/A;   ENUCLEATION  2001   "right"   ESOPHAGOGASTRODUODENOSCOPY (EGD) WITH PROPOFOL N/A 04/21/2012   Procedure: ESOPHAGOGASTRODUODENOSCOPY (EGD) WITH PROPOFOL;  Surgeon: Rachael Fee, MD;  Location: [facility] ENDOSCOPY;  Service: Endoscopy;  Laterality: N/A;   ESOPHAGOGASTRODUODENOSCOPY (EGD) WITH PROPOFOL N/A 09/12/2020   Procedure: ESOPHAGOGASTRODUODENOSCOPY (EGD) WITH PROPOFOL;  Surgeon: Rachael Fee, MD;  Location: [facility] ENDOSCOPY;  Service: Endoscopy;  Laterality: N/A;   INSERTION OF DIALYSIS CATHETER  1988   "AV graft LUA & LFA; LUA worked for 1 day; LFA never worked"   IR FLUORO GUIDE CV LINE RIGHT  07/11/2019   IR FLUORO GUIDE CV LINE RIGHT  10/20/2019   IR FLUORO GUIDE CV LINE RIGHT  05/31/2020   IR PTA VENOUS EXCEPT DIALYSIS CIRCUIT  05/31/2020   IR RADIOLOGY PERIPHERAL GUIDED IV START  07/11/2019   IR US GUIDE VASC ACCESS RIGHT  07/11/2019   IR US GUIDE VASC ACCESS RIGHT  07/11/2019   IR US GUIDE VASC ACCESS  RIGHT  07/11/2019   IR VENOCAVAGRAM IVC  05/31/2020   KIDNEY TRANSPLANT  1994; 1999; 2005   "right"   MULTIPLE TOOTH EXTRACTIONS     POLYPECTOMY  09/12/2020   Procedure: POLYPECTOMY;  Surgeon: Rachael Fee, MD;  Location: [facility] ENDOSCOPY;  Service: Endoscopy;;   RIGHT HEART CATH N/A 03/23/2019   Procedure: RIGHT HEART CATH;  Surgeon: Dolores Patty, MD;  Location: [facility] INVASIVE CV LAB;  Service: Cardiovascular;  Laterality: N/A;   TONSILLECTOMY     TOTAL NEPHRECTOMY  1988?; 1994; 2005   Patient Active Problem List   Diagnosis Date Noted   Prolonged Q-T interval on ECG 09/07/2021   Nausea & vomiting 09/06/2021   Bilateral shoulder pain 06/06/2021   Bilateral hand swelling 06/06/2021   Cervical radiculitis 01/24/2021   Acute respiratory failure with hypoxia (HCC) 10/23/2020   Coagulation defect, unspecified (HCC) 08/01/2020   Aortic atherosclerosis (HCC) 07/10/2020   Secondary hypercoagulable state (HCC) 04/23/2020   Atrial flutter (HCC) 03/29/2020   Chronic pain of both knees 03/13/2020   Chills (without fever) 12/12/2019   Other pancytopenia (HCC)    Decubitus ulcer of sacral region, stage 1 08/18/2019   HCAP (healthcare-associated pneumonia) 08/05/2019   Pressure injury of skin 07/24/2019   Chest congestion 07/24/2019   Itching 07/24/2019   Weakness 06/13/2019   Pneumonia due to COVID-19 virus 05/09/2019   Hypotension 05/09/2019   Symptomatic anemia 05/09/2019   CHF (congestive heart failure) (HCC) 05/09/2019   Swollen abdomen 01/04/2019   DDD (degenerative disc disease), cervical 12/06/2018   Neuropathy 12/06/2018   Paresthesia 10/11/2018   Neck pain 10/11/2018   Tobacco abuse 11/20/2017   Right leg swelling 11/20/2017   Right leg pain 11/20/2017   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   GERD (gastroesophageal reflux disease) 11/20/2017   Acute venous embolism and thrombosis of deep vessels of proximal lower extremity, right (HCC) 11/20/2017   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    Thrush, oral    AKI (acute kidney injury) (HCC)    Multifocal pneumonia 08/09/2017   ETD (Eustachian tube dysfunction), bilateral 08/03/2017   Acute recurrent pansinusitis 07/21/2017   Chest pain 09/29/2015   Cervical stenosis of spinal canal 01/08/2015   Urinary tract infectious disease    Muscle spasms of neck 06/15/2014   Bleeding hemorrhoid 06/15/2014   Anemia in chronic kidney disease 06/15/2014   Pyelonephritis, acute 06/13/2014   Sepsis (HCC) 06/13/2014   History of kidney transplant    Abnormal CT scan 09/14/2012   Chronic pain syndrome 04/09/2012   Dehydration, mild 04/09/2012   Herpes infection 06/23/2011   Anxiety 06/23/2011  E coli bacteremia 06/18/2011   ESRD (end stage renal disease) ([place]) 06/12/2011   UTI (lower urinary tract infection) 06/12/2011   Bacteremia due to Gram-negative bacteria 05/23/2011   History of renal transplantation 05/22/2011   Septic shock(785.52) 05/22/2011   Acute on chronic kidney failure ([place]) 05/22/2011   Gastroparesis 04/24/2008   WEIGHT LOSS 08/24/2007   NAUSEA AND VOMITING 08/24/2007    REFERRING DIAG: M25.511,G89.29,M25.512 (ICD-10-CM) - Chronic pain of both shoulders  THERAPY DIAG:  Chronic left shoulder pain  Chronic right shoulder pain  Stiffness of left shoulder, not elsewhere classified  Stiffness of right shoulder, not elsewhere classified  Muscle weakness (generalized)  Rationale for Evaluation and Treatment Rehabilitation  SUBJECTIVE:                                                                                                                                                                                       SUBJECTIVE STATEMENT: My L shoulder is bothering me a lot today.    PERTINENT HISTORY DDD cervical, fibromyalgia, spinal stenosis cervical, CHF   PAIN:  Are you having pain? Yes: NPRS scale: L 10/10, R 8/10 Pain location: Upper shoulders to  [place] jt to upper arms Pain description: ache and throb, dull , spasms  Aggravating factors: Just using my arms Relieving factors: Meds, massage , heat  Pain range: 5-9/10  PERTINENT HISTORY: DDD cervical, fibromyalgia, spinal stenosis cervical, CHF   PRECAUTIONS: None   PATIENT GOALS More strength and mobility of her  shoulders   DIAGNOSTIC FINDINGS:  X-ray right shoulder 4 views 06/20/21  Glenohumeral joint space appears normal.  Normal internal and external  rotation of humerus.  AC joint space appears normal.  No visible erosions  or abnormal calcifications seen.   Impression   Normal appearing shoulder x-ray   X-ray left shoulder 4 views 06/20/21  Glenohumeral joint space appears normal.  Normal internal and external  rotation of humerus.  AC joint space appears normal.  No visible erosions  or abnormal calcifications seen.   Impression   Normal appearing shoulder x-ray   PATIENT SURVEYS:  FOTO 58% predicted 64%   COGNITION:           Overall cognitive status: Within functional limits for tasks assessed                                  SENSATION: WFL   POSTURE: Forward head and rounded shoulders   UPPER EXTREMITY ROM:     AROM/AAROM/PROM Right eval Left eval Rt AROM 08/06/21 L AROM 08/06/21 R Arom 08/20/21 L Arom 08/20/21 R  AROM 09/03/21 L ARO8/2/69M  Shoulder flexion 85/118/115 88/110/110 105 107 110 110 101 100  Shoulder extension            Shoulder abduction            Shoulder adduction            Shoulder internal rotation            Shoulder external rotation            Elbow flexion            Elbow extension            Wrist flexion            Wrist extension            Wrist ulnar deviation            Wrist radial deviation            Wrist pronation            Wrist supination            (Blank rows = not tested)   UPPER EXTREMITY MMT: Tested strength in a neutral position due to pain. Pt demonstrated strength against resistance seeming  at least 3/5. No overt weakness noted. MMT Right 7/17 Left 7/17 Rt 09/03/21 Lt 09/03/21  Shoulder flexion  2+  3- 3 3  Shoulder extension        Shoulder abduction     3 3  Shoulder adduction        Shoulder internal rotation  4-  $R'4 4 4  'cy$ Shoulder external rotation  3  3+ 3+ 3+  Middle trapezius        Lower trapezius        Elbow flexion        Elbow extension        Wrist flexion        Wrist extension        Wrist ulnar deviation        Wrist radial deviation        Wrist pronation        Wrist supination        Grip strength (lbs)        (Blank rows = not tested)   SHOULDER SPECIAL TESTS:            Impingement tests: Hawkins/Kennedy impingement test: positive             Rotator cuff assessment: Empty can test: positive  and Full can test: positive             Both tests were painful            Biceps assessment: Yergason's test: positive    PALPATION:  TTP to the upper trap and peri-GH area             TODAY'S TREATMENT:  [facility] Adult PT Treatment:                                                DATE: 09/19/21 Therapeutic Exercise: Pulleys standing facing anchor overhead NuStep UEs > LEs for 5 min  Isometric ex for flex, abd, ext x5 10" Manual Therapy: PROM all planes bilaterally with light distraction in various angles Oscillation and GH mobs to R/L shoulder  Modalities  [facility] Adult PT Treatment:                                                DATE: 09/05/21 Therapeutic Exercise: Pulleys standing facing anchor overhead NuStep UEs > LEs for 5 min  Supine cane chest press x 10  Overhead lift x 10  Manual Therapy: PROM all planes bilaterally with light distraction in various angles PROM  Flexion 115-120  Abduction 95 deg ER 40 deg IR 60 deg  Oscillation and GH mobs to R/L shoulder  Self care: POC, progress, FOTO score  [facility] Adult PT Treatment:                                                DATE: 09/03/21 Shoulder pulley flexion 2 mins Shoulder row 2x10  YTB Shoulder ext 2x10 YTB Pendulum flex and ext c 4# Table slides for UE flexion x 10, scaption  x 10 each Bilat shoulder ER YTB x10, min ROM  Supine chest press AAROM shoulder flexion  x 10, slow and painful  Manual Therapy: PROM all planes bilaterally with light distraction in various angles   PATIENT EDUCATION: Education details: HEP, POC,  Person educated: Patient Education method: Explanation, Demonstration, Tactile cues, Verbal cues, and Handouts Education comprehension: verbalized understanding, returned demonstration, verbal cues required, and tactile cues required     HOME EXERCISE PROGRAM: Access Code: [email or web address]   ASSESSMENT:   CLINICAL IMPRESSION: PT was completed for bilat shoulder mobility and strengthening. Strengthening was completed per isometrics to minimize pain. Manual therapy for shoulder jt mobs c oscillations and moist heat was provided for pain management. Pt tolerated PT today without adverse effects.  OBJECTIVE IMPAIRMENTS decreased activity tolerance, decreased ROM, decreased strength, increased fascial restrictions, increased muscle spasms, impaired UE functional use, postural dysfunction, and pain.    ACTIVITY LIMITATIONS carrying, lifting, bending, sleeping, bed mobility, bathing, toileting, dressing, reach over head, hygiene/grooming, and caring for others   PARTICIPATION LIMITATIONS: meal prep, cleaning, laundry, driving, shopping, community activity, and yard work   PERSONAL FACTORS Past/current experiences, Time since onset of injury/illness/exacerbation, and 3+ comorbidities:    DDD cervical, fibromyalgia, spinal stenosis cervical are also affecting patient's functional outcome.    REHAB POTENTIAL: Fair due to chronicity   CLINICAL DECISION MAKING: Evolving/moderate complexity   EVALUATION COMPLEXITY: Moderate     GOALS:     LONG TERM GOALS: Target date: 09/05/21    Pt's shoulder AROM will increase to R 105d and L 100d for improved  shoulder function Baseline: about 90 deg today Status: Lt 110 deg, Rt 116 deg Goal status: MET   2.  Pt will demonstrate 4/5 shoulder strength for improved shoulder function Baseline: 3/5 to 3+/5    STATUS: see flow sheet Goal status: MET   3.  Pt's FOTO score will improve to 64% as indication of improved function Baseline: 48% now 55% on Re-assess Goal status:ongoing    4.  Pt will be Ind in a HEP to maintain achieved LOF Baseline:  Status: Verbal cueing for proper completion  Goal status: ongoing    PLAN: PT FREQUENCY: 2x/week   PT DURATION: 3 weeks   PLANNED INTERVENTIONS: Therapeutic exercises, Therapeutic activity, Patient/Family education, Joint mobilization, Aquatic Therapy, Dry  Needling, Cryotherapy, Moist heat, Taping, Ultrasound, Ionotophoresis 4mg /ml Dexamethasone, Manual therapy, and Re-evaluation   PLAN FOR NEXT SESSION:  ROM, as tolerated. Pulleys, table slides . use of modalities, manual therapy and TPDN as inidcated. Reassess [place] MS, PT 09/19/21 1:37 PM

## 2021-09-22 ENCOUNTER — Ambulatory Visit: Payer: Medicare Other | Admitting: Physical Therapy

## 2021-09-24 ENCOUNTER — Ambulatory Visit
Admission: RE | Admit: 2021-09-24 | Discharge: 2021-09-24 | Disposition: A | Discharge status: Home / Self Care | Payer: Medicare Other | Source: Ambulatory Visit

## 2021-09-24 ENCOUNTER — Ambulatory Visit: Payer: Medicare Other

## 2021-09-24 DIAGNOSIS — Z122 Encounter for screening for malignant neoplasm of respiratory organs: Secondary | ICD-10-CM

## 2021-09-24 DIAGNOSIS — F17210 Nicotine dependence, cigarettes, uncomplicated: Secondary | ICD-10-CM

## 2021-09-24 DIAGNOSIS — Z87891 Personal history of nicotine dependence: Secondary | ICD-10-CM

## 2021-09-26 ENCOUNTER — Other Ambulatory Visit: Payer: Self-pay | Admitting: Acute Care

## 2021-09-26 ENCOUNTER — Encounter: Payer: Self-pay | Admitting: Internal Medicine

## 2021-09-26 ENCOUNTER — Ambulatory Visit: Payer: Medicare Other | Attending: Internal Medicine | Admitting: Internal Medicine

## 2021-09-26 VITALS — BP 92/65 | HR 91 | Resp 14 | Ht 59.0 in | Wt 110.6 lb

## 2021-09-26 DIAGNOSIS — Z87891 Personal history of nicotine dependence: Secondary | ICD-10-CM

## 2021-09-26 DIAGNOSIS — Z122 Encounter for screening for malignant neoplasm of respiratory organs: Secondary | ICD-10-CM

## 2021-09-26 DIAGNOSIS — M1A3720 Chronic gout due to renal impairment, left ankle and foot, without tophus (tophi): Secondary | ICD-10-CM | POA: Insufficient documentation

## 2021-09-26 DIAGNOSIS — M7989 Other specified soft tissue disorders: Secondary | ICD-10-CM | POA: Diagnosis present

## 2021-09-26 DIAGNOSIS — M25511 Pain in right shoulder: Secondary | ICD-10-CM | POA: Insufficient documentation

## 2021-09-26 DIAGNOSIS — M25512 Pain in left shoulder: Secondary | ICD-10-CM | POA: Diagnosis present

## 2021-09-26 DIAGNOSIS — M5030 Other cervical disc degeneration, unspecified cervical region: Secondary | ICD-10-CM | POA: Diagnosis present

## 2021-09-26 DIAGNOSIS — F17210 Nicotine dependence, cigarettes, uncomplicated: Secondary | ICD-10-CM

## 2021-09-26 DIAGNOSIS — G8929 Other chronic pain: Secondary | ICD-10-CM | POA: Insufficient documentation

## 2021-09-27 ENCOUNTER — Other Ambulatory Visit: Payer: Self-pay | Admitting: Gastroenterology

## 2021-09-27 IMAGING — XA IR FLUORO GUIDE CV LINE*R*
5 series · 5 of 5 positions shown · non-contrast
Comparison: none

CLINICAL DATA: Failing renal transplant and need for hemodialysis.
History of multiple prior tunneled dialysis catheter placements and
vascular access interventions including prior SVC stenting with
documented SVC stent occlusion in 3997, tunneled femoral dialysis
catheter placement in 9996-9991 complicated by IVC stenoses and
translumbar IVC dialysis catheter placement in 0114 complicated by
IVC stenosis. Request now made to try to place a tunneled dialysis
catheter for current hemodialysis needs. Peripheral IV access is
also needed for IV conscious sedation during the procedure and IV
antibiotic administration.

[Series 1: fl (-) angio · 1 of 1 slices shown (1 of 5)]
[im 1/1]
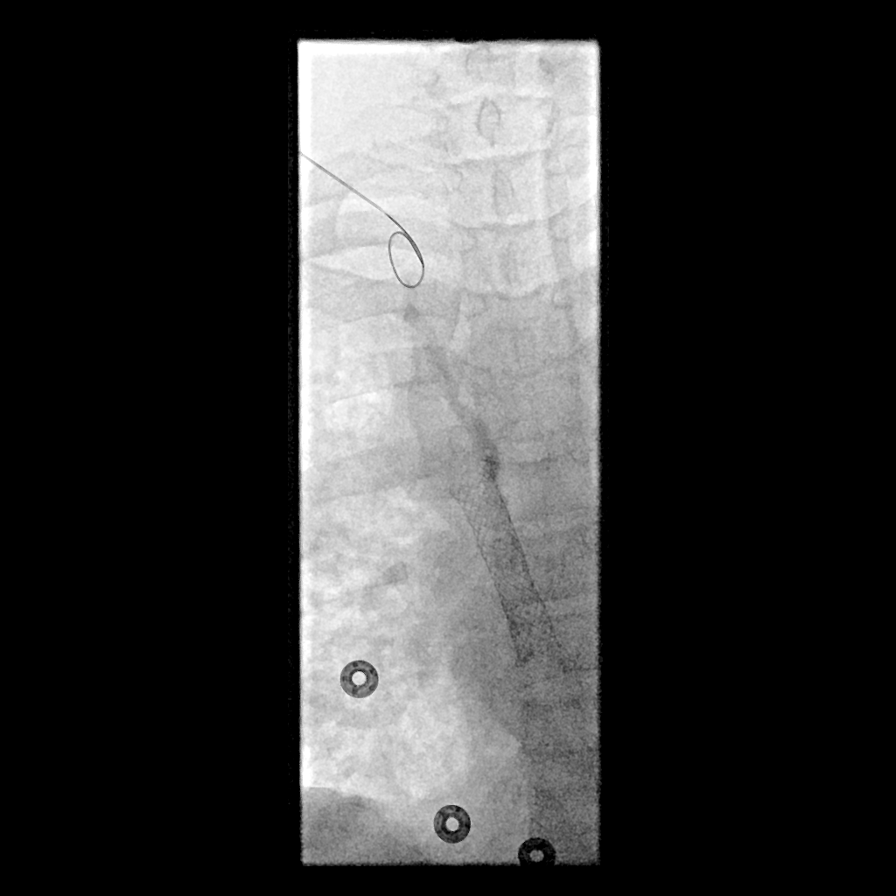

[Series 2: fl (-) angio · 1 of 1 slices shown (2 of 5)]
[im 1/1]
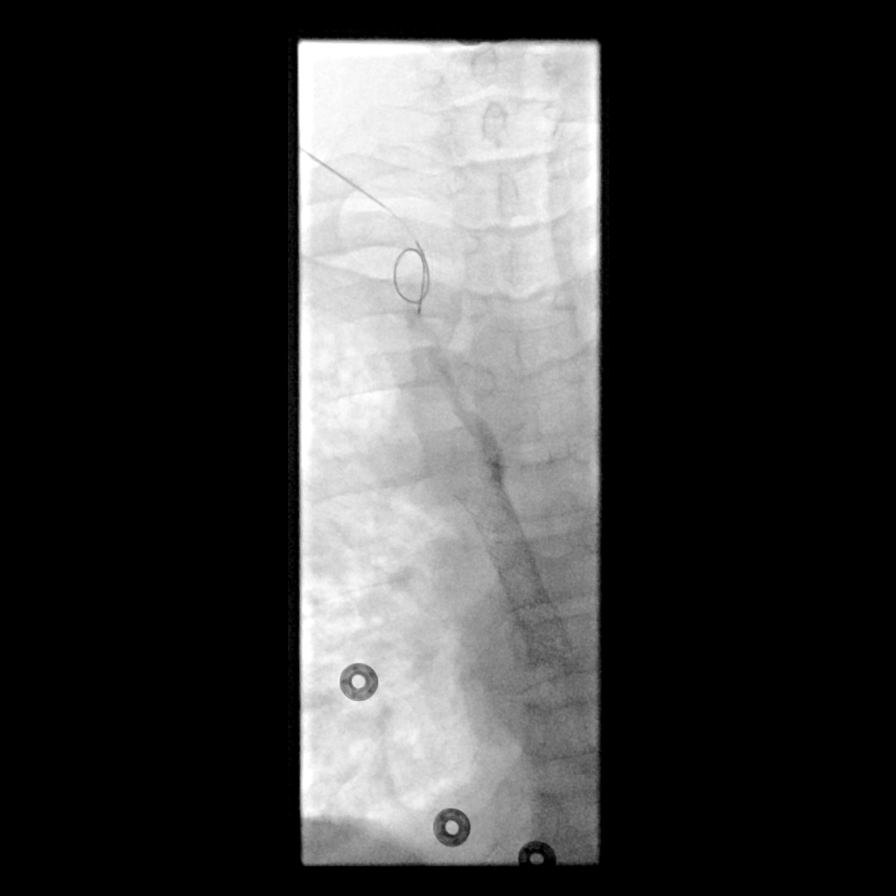

[Series 4: fl (-) angio · 1 of 1 slices shown (3 of 5)]
[im 1/1]
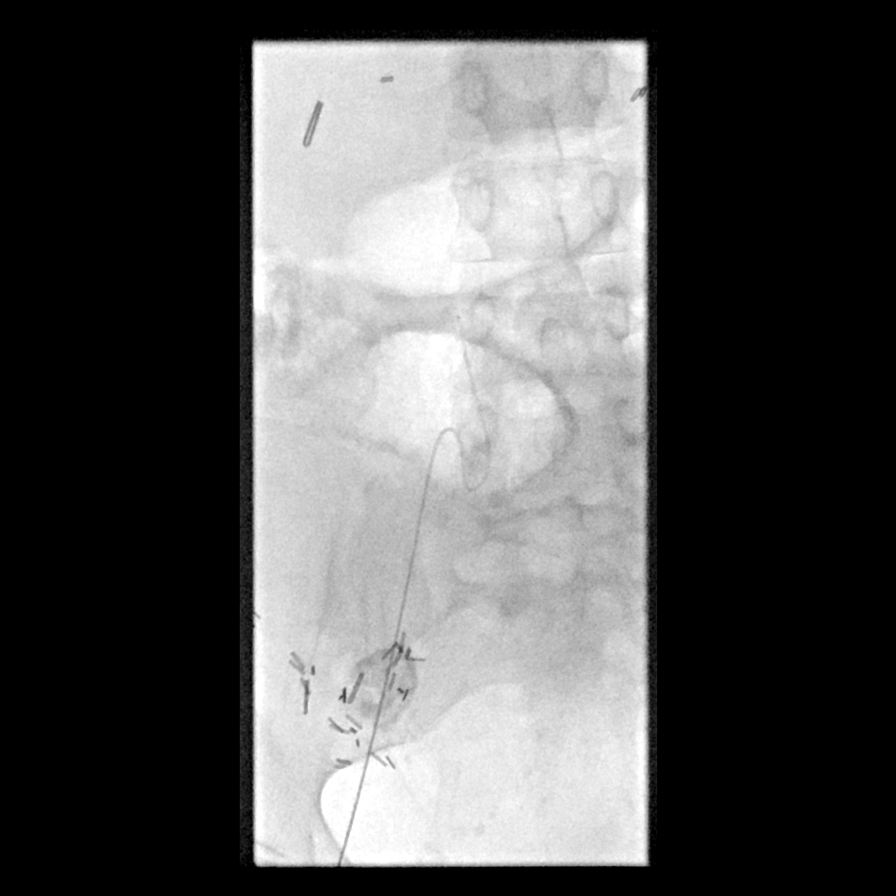

[Series 5: fl (-) angio · 1 of 1 slices shown (4 of 5)]
[im 1/1]
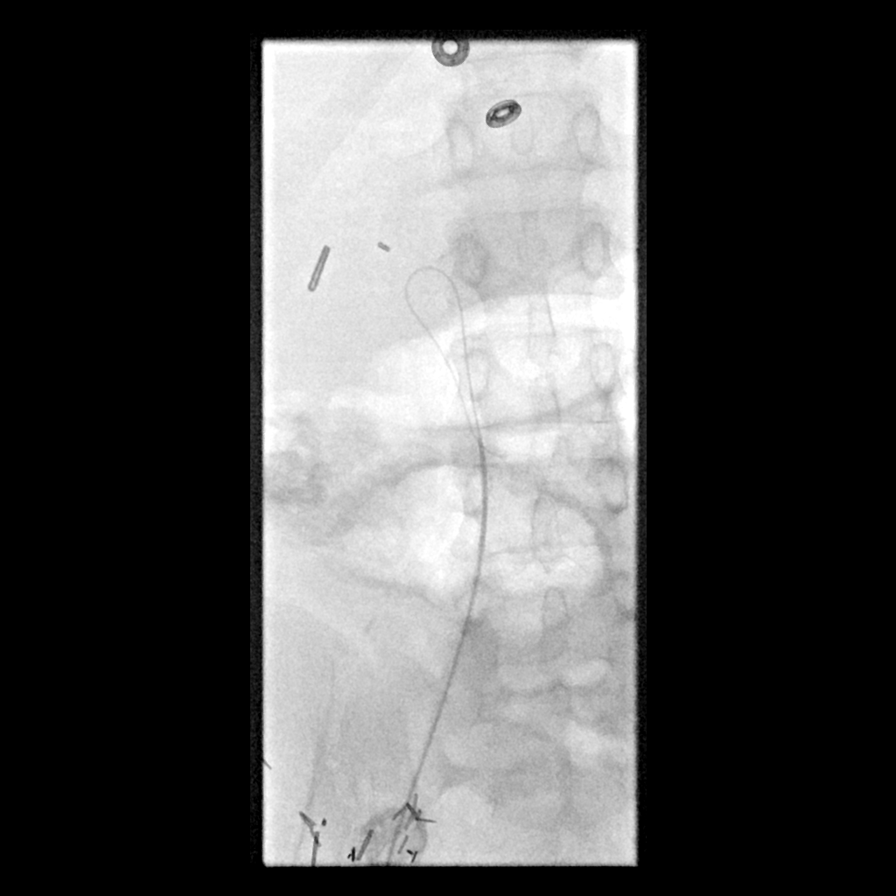

[Series 6: fl (-) angio · 1 of 1 slices shown (5 of 5)]
[im 1/1]
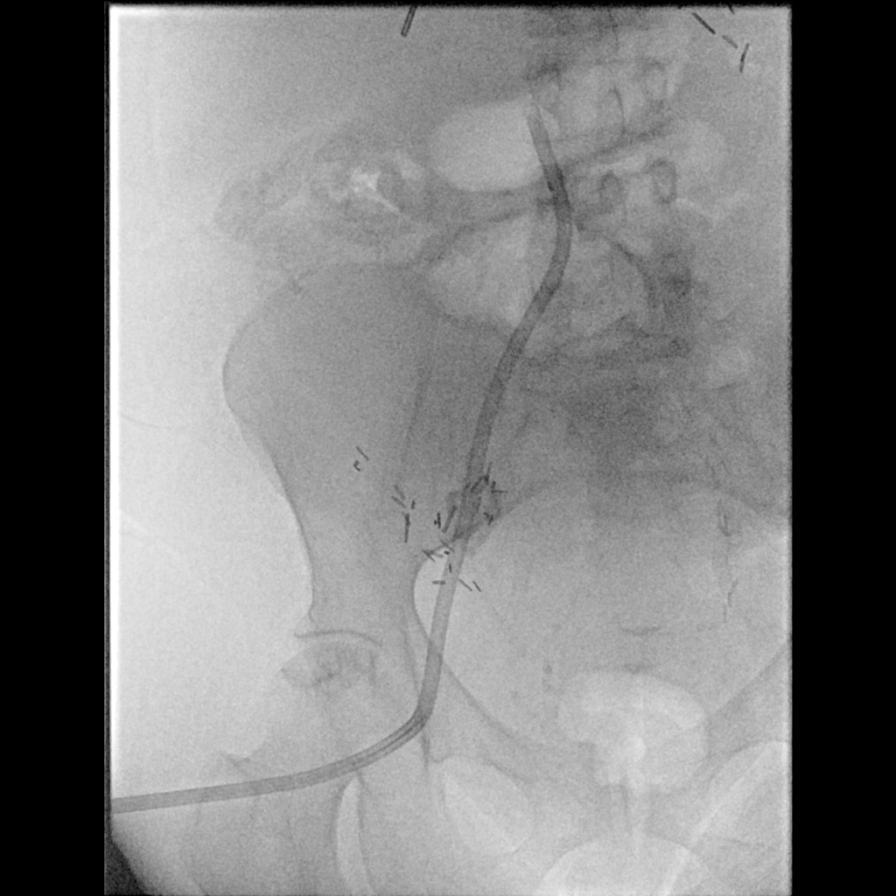

[5 of 5 positions shown; findings below may reference images not displayed]

EXAM:
1. PERIPHERAL IV ACCESS VIA RIGHT BASILIC VEIN UNDER ULTRASOUND
GUIDANCE
2. RIGHT INTERNAL JUGULAR VEIN ACCESS UNDER ULTRASOUND GUIDANCE
3. TUNNELED CENTRAL VENOUS HEMODIALYSIS CATHETER PLACEMENT VIA RIGHT
COMMON FEMORAL VEIN WITH ULTRASOUND AND FLUOROSCOPIC GUIDANCE

ANESTHESIA/SEDATION:
1.0 mg IV Versed; 25 mcg IV Fentanyl.

Total Moderate Sedation Time:   35 minutes.

The patient's level of consciousness and physiologic status were
continuously monitored during the procedure by Radiology nursing.

MEDICATIONS:
2 g IV Ancef.

FLUOROSCOPY TIME:  3 minutes and 24 seconds.  18 mGy.

PROCEDURE:
The procedure, risks, benefits, and alternatives were explained to
the patient. Questions regarding the procedure were encouraged and
answered. The patient understands and consents to the procedure. A
timeout was performed prior to initiating the procedure.

Ultrasound was used to confirm patency of the right basilic vein in
the upper arm. Skin of the right medial upper arm was prepped with
chlorhexidine. 1% lidocaine was infiltrated at the level of the skin
and subcutaneous tissues. Under ultrasound guidance, a 21 gauge
needle was advanced into the right basilic vein. A guidewire was
advanced. A 5 French micropuncture dilator was then advanced into
the vein. This was capped and secured for IV access during the
procedure for IV antibiotic administration and IV conscious
sedation.

Preliminary ultrasound was performed of the right and left neck and
right groin. Ultrasound was used to confirm patency of the right
internal jugular vein and right common femoral vein. The right neck
and right groin were prepped with chlorhexidine in a sterile
fashion, and a sterile drape was applied covering the operative
field. Maximum barrier sterile technique with sterile gowns and
gloves were used for the procedure. Local anesthesia was provided
with 1% lidocaine.

A 21 gauge needle was advanced into the right internal jugular vein
under direct, real-time ultrasound guidance. A guidewire was
advanced. A transitional dilator was placed. Over a guidewire a 5
French catheter was then advanced. Attempt was made to advance the
catheter into the SVC over a guidewire. Fluoroscopic spot images
were obtained. Access of the right internal jugular vein was then
abandoned and hemostasis obtained with manual compression.

Under ultrasound guidance, a 21 gauge needle was advanced into the
right common femoral vein. Ultrasound image documentation was
performed. A venotomy incision was made in the right groin region.
After securing guidewire access, a 5 French catheter was advanced
over a guidewire. Guidewire advancement was performed into the
inferior vena cava. A wire was kinked to measure appropriate
tunneled dialysis catheter length.

A Palindrome tunneled hemodialysis catheter measuring 28 cm from tip
to cuff was chosen for placement. This was tunneled in a retrograde
fashion from the right thigh to the venotomy incision.

At the venotomy, serial dilatation was performed and a 16 Fr
peel-away sheath was placed over a guidewire. The catheter was then
placed through the sheath and the sheath removed. Final catheter
positioning was confirmed and documented with a fluoroscopic spot
image. The catheter was aspirated, flushed with saline, and injected
with appropriate volume heparin dwells.

The venotomy incision was closed with subcuticular 4-0 Vicryl.
Dermabond was applied to the incision. The catheter exit site was
secured with a Prolene retention suture.

COMPLICATIONS:
None.
FINDINGS: Initial ultrasound demonstrates a patent right internal jugular
vein. There is no patent left internal jugular vein present with
multiple small collateral veins present in the left neck. The right
common femoral vein is normally patent in the right groin.

Initial access of the right internal jugular vein was performed. A
guidewire would not advance beyond the confluence of the
brachiocephalic veins at the origin of the upper SVC. Under
fluoroscopy, there is clearly a large amount of calcification within
the upper SVC likely reflecting calcified chronic thrombus which is
present above the level of an indwelling lower IVC stent.
Confirmation of venous occlusion was performed with a catheter and
guidewire which could not be advanced below the level of chronic
venous occlusion. The patient is not a candidate for future tunnel
catheter placements in the chest due to chronic IVC occlusion.

The right common femoral vein was accessed. A guidewire initially
advanced through the common femoral and external iliac veins easily
and met some resistance at the confluence of the common iliac veins
and expected lower IVC. A 5 French catheter was able to be advanced
over a guidewire to about the level of the mid IVC but there was
further resistance to guidewire and catheter passage at this level
likely on the basis of chronic stenosis of the IVC. This is also
near the location of previous translumbar IVC access for catheter
placement.

After right femoral tunneled catheter placement, the tip lies in the
lower IVC. The catheter aspirates well and is ready for immediate
use. Flow rates may be somewhat limited given probable IVC stenosis
above the level of the tip of the dialysis catheter. However, this
is the best access possible currently for a tunneled catheter and
other access sites will be quite limited given findings today and
prior history.
IMPRESSION: 1. Ultrasound-guided venous access of the right basilic vein in the
upper arm performed for IV antibiotic administration and IV
conscious sedation during dialysis catheter placement.
2. Patent right internal jugular vein was accessed and chronic
occlusion of the entire SVC confirmed by fluoroscopic probing with a
catheter and guidewire. The upper SVC above the level of an
indwelling stent is calcified, likely reflecting chronic calcified
thrombus. The left internal jugular vein is chronically occluded.
The patient is not a candidate for future catheter placement in the
chest given chronic SVC occlusion.
3. Patent right common femoral vein allowing placement of a tunneled
dialysis catheter. This catheter was advanced to the level of the
lower IVC and measures 28 cm from tip to cuff. A longer catheter was
not able to be placed given catheter and guidewire evidence of
significant mid IVC stenosis or occlusion based on restricted
ability to advance a catheter and guidewire.

## 2021-09-27 IMAGING — US IR RADIOLOGY PERIPHERAL GUIDED IV START
1 series · 1 of 1 positions shown · non-contrast
Comparison: none

CLINICAL DATA: Failing renal transplant and need for hemodialysis.
History of multiple prior tunneled dialysis catheter placements and
vascular access interventions including prior SVC stenting with
documented SVC stent occlusion in 3997, tunneled femoral dialysis
catheter placement in 9996-9991 complicated by IVC stenoses and
translumbar IVC dialysis catheter placement in 0114 complicated by
IVC stenosis. Request now made to try to place a tunneled dialysis
catheter for current hemodialysis needs. Peripheral IV access is
also needed for IV conscious sedation during the procedure and IV
antibiotic administration.

[Series 1: ir radiology peripheral guided iv start · 1 of 1 slices shown]
[im 1/1]
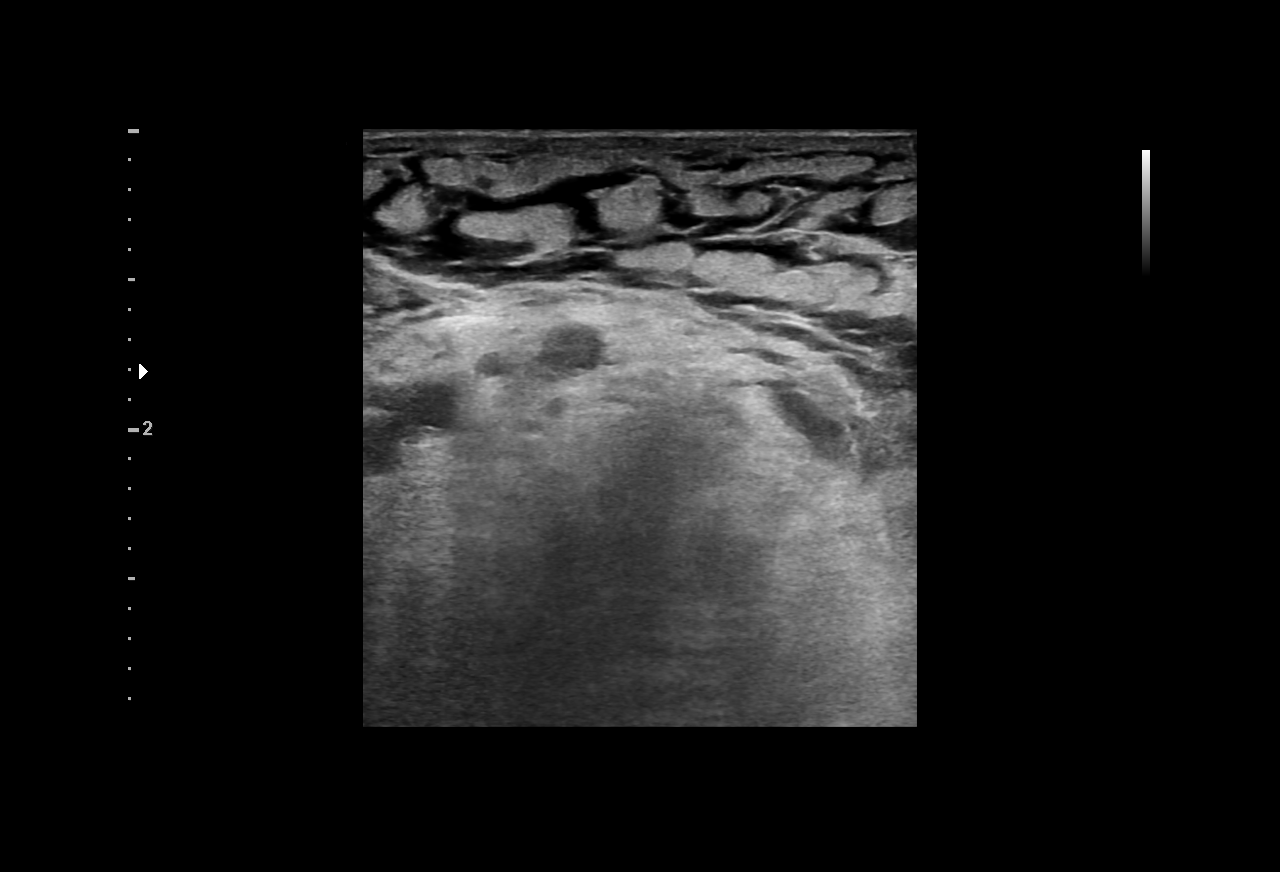

[1 of 1 positions shown; findings below may reference images not displayed]

EXAM:
1. PERIPHERAL IV ACCESS VIA RIGHT BASILIC VEIN UNDER ULTRASOUND
GUIDANCE
2. RIGHT INTERNAL JUGULAR VEIN ACCESS UNDER ULTRASOUND GUIDANCE
3. TUNNELED CENTRAL VENOUS HEMODIALYSIS CATHETER PLACEMENT VIA RIGHT
COMMON FEMORAL VEIN WITH ULTRASOUND AND FLUOROSCOPIC GUIDANCE

ANESTHESIA/SEDATION:
1.0 mg IV Versed; 25 mcg IV Fentanyl.

Total Moderate Sedation Time:   35 minutes.

The patient's level of consciousness and physiologic status were
continuously monitored during the procedure by Radiology nursing.

MEDICATIONS:
2 g IV Ancef.

FLUOROSCOPY TIME:  3 minutes and 24 seconds.  18 mGy.

PROCEDURE:
The procedure, risks, benefits, and alternatives were explained to
the patient. Questions regarding the procedure were encouraged and
answered. The patient understands and consents to the procedure. A
timeout was performed prior to initiating the procedure.

Ultrasound was used to confirm patency of the right basilic vein in
the upper arm. Skin of the right medial upper arm was prepped with
chlorhexidine. 1% lidocaine was infiltrated at the level of the skin
and subcutaneous tissues. Under ultrasound guidance, a 21 gauge
needle was advanced into the right basilic vein. A guidewire was
advanced. A 5 French micropuncture dilator was then advanced into
the vein. This was capped and secured for IV access during the
procedure for IV antibiotic administration and IV conscious
sedation.

Preliminary ultrasound was performed of the right and left neck and
right groin. Ultrasound was used to confirm patency of the right
internal jugular vein and right common femoral vein. The right neck
and right groin were prepped with chlorhexidine in a sterile
fashion, and a sterile drape was applied covering the operative
field. Maximum barrier sterile technique with sterile gowns and
gloves were used for the procedure. Local anesthesia was provided
with 1% lidocaine.

A 21 gauge needle was advanced into the right internal jugular vein
under direct, real-time ultrasound guidance. A guidewire was
advanced. A transitional dilator was placed. Over a guidewire a 5
French catheter was then advanced. Attempt was made to advance the
catheter into the SVC over a guidewire. Fluoroscopic spot images
were obtained. Access of the right internal jugular vein was then
abandoned and hemostasis obtained with manual compression.

Under ultrasound guidance, a 21 gauge needle was advanced into the
right common femoral vein. Ultrasound image documentation was
performed. A venotomy incision was made in the right groin region.
After securing guidewire access, a 5 French catheter was advanced
over a guidewire. Guidewire advancement was performed into the
inferior vena cava. A wire was kinked to measure appropriate
tunneled dialysis catheter length.

A Palindrome tunneled hemodialysis catheter measuring 28 cm from tip
to cuff was chosen for placement. This was tunneled in a retrograde
fashion from the right thigh to the venotomy incision.

At the venotomy, serial dilatation was performed and a 16 Fr
peel-away sheath was placed over a guidewire. The catheter was then
placed through the sheath and the sheath removed. Final catheter
positioning was confirmed and documented with a fluoroscopic spot
image. The catheter was aspirated, flushed with saline, and injected
with appropriate volume heparin dwells.

The venotomy incision was closed with subcuticular 4-0 Vicryl.
Dermabond was applied to the incision. The catheter exit site was
secured with a Prolene retention suture.

COMPLICATIONS:
None.
FINDINGS: Initial ultrasound demonstrates a patent right internal jugular
vein. There is no patent left internal jugular vein present with
multiple small collateral veins present in the left neck. The right
common femoral vein is normally patent in the right groin.

Initial access of the right internal jugular vein was performed. A
guidewire would not advance beyond the confluence of the
brachiocephalic veins at the origin of the upper SVC. Under
fluoroscopy, there is clearly a large amount of calcification within
the upper SVC likely reflecting calcified chronic thrombus which is
present above the level of an indwelling lower IVC stent.
Confirmation of venous occlusion was performed with a catheter and
guidewire which could not be advanced below the level of chronic
venous occlusion. The patient is not a candidate for future tunnel
catheter placements in the chest due to chronic IVC occlusion.

The right common femoral vein was accessed. A guidewire initially
advanced through the common femoral and external iliac veins easily
and met some resistance at the confluence of the common iliac veins
and expected lower IVC. A 5 French catheter was able to be advanced
over a guidewire to about the level of the mid IVC but there was
further resistance to guidewire and catheter passage at this level
likely on the basis of chronic stenosis of the IVC. This is also
near the location of previous translumbar IVC access for catheter
placement.

After right femoral tunneled catheter placement, the tip lies in the
lower IVC. The catheter aspirates well and is ready for immediate
use. Flow rates may be somewhat limited given probable IVC stenosis
above the level of the tip of the dialysis catheter. However, this
is the best access possible currently for a tunneled catheter and
other access sites will be quite limited given findings today and
prior history.
IMPRESSION: 1. Ultrasound-guided venous access of the right basilic vein in the
upper arm performed for IV antibiotic administration and IV
conscious sedation during dialysis catheter placement.
2. Patent right internal jugular vein was accessed and chronic
occlusion of the entire SVC confirmed by fluoroscopic probing with a
catheter and guidewire. The upper SVC above the level of an
indwelling stent is calcified, likely reflecting chronic calcified
thrombus. The left internal jugular vein is chronically occluded.
The patient is not a candidate for future catheter placement in the
chest given chronic SVC occlusion.
3. Patent right common femoral vein allowing placement of a tunneled
dialysis catheter. This catheter was advanced to the level of the
lower IVC and measures 28 cm from tip to cuff. A longer catheter was
not able to be placed given catheter and guidewire evidence of
significant mid IVC stenosis or occlusion based on restricted
ability to advance a catheter and guidewire.

## 2021-10-01 ENCOUNTER — Ambulatory Visit (HOSPITAL_COMMUNITY)
Admission: RE | Admit: 2021-10-01 | Discharge: 2021-10-01 | Disposition: A | Discharge status: Home / Self Care | Payer: Medicare Other | Source: Ambulatory Visit | Attending: Nurse Practitioner | Admitting: Nurse Practitioner

## 2021-10-01 ENCOUNTER — Ambulatory Visit: Payer: Medicare Other

## 2021-10-01 ENCOUNTER — Encounter (HOSPITAL_COMMUNITY): Payer: Self-pay | Admitting: Nurse Practitioner

## 2021-10-01 VITALS — BP 86/48 | HR 64 | Ht 59.0 in | Wt 112.0 lb

## 2021-10-01 DIAGNOSIS — M25611 Stiffness of right shoulder, not elsewhere classified: Secondary | ICD-10-CM

## 2021-10-01 DIAGNOSIS — D6869 Other thrombophilia: Secondary | ICD-10-CM | POA: Diagnosis not present

## 2021-10-01 DIAGNOSIS — Z7901 Long term (current) use of anticoagulants: Secondary | ICD-10-CM | POA: Insufficient documentation

## 2021-10-01 DIAGNOSIS — I959 Hypotension, unspecified: Secondary | ICD-10-CM | POA: Diagnosis not present

## 2021-10-01 DIAGNOSIS — I132 Hypertensive heart and chronic kidney disease with heart failure and with stage 5 chronic kidney disease, or end stage renal disease: Secondary | ICD-10-CM | POA: Insufficient documentation

## 2021-10-01 DIAGNOSIS — M25512 Pain in left shoulder: Secondary | ICD-10-CM | POA: Diagnosis not present

## 2021-10-01 DIAGNOSIS — G8929 Other chronic pain: Secondary | ICD-10-CM

## 2021-10-01 DIAGNOSIS — M25612 Stiffness of left shoulder, not elsewhere classified: Secondary | ICD-10-CM

## 2021-10-01 DIAGNOSIS — I5032 Chronic diastolic (congestive) heart failure: Secondary | ICD-10-CM | POA: Insufficient documentation

## 2021-10-01 DIAGNOSIS — K3184 Gastroparesis: Secondary | ICD-10-CM | POA: Diagnosis not present

## 2021-10-01 DIAGNOSIS — N186 End stage renal disease: Secondary | ICD-10-CM | POA: Insufficient documentation

## 2021-10-01 DIAGNOSIS — M6281 Muscle weakness (generalized): Secondary | ICD-10-CM

## 2021-10-01 DIAGNOSIS — I4892 Unspecified atrial flutter: Secondary | ICD-10-CM

## 2021-10-01 LAB — COMPREHENSIVE METABOLIC PANEL
ALT: 19 U/L (ref 0–44)
AST: 24 U/L (ref 15–41)
Albumin: 4.2 g/dL (ref 3.5–5.0)
Alkaline Phosphatase: 117 U/L (ref 38–126)
Anion gap: 12 (ref 5–15)
BUN: 46 mg/dL — ABNORMAL HIGH (ref 6–20)
CO2: 24 mmol/L (ref 22–32)
Calcium: 9.7 mg/dL (ref 8.9–10.3)
Chloride: 98 mmol/L (ref 98–111)
Creatinine, Ser: 5.15 mg/dL — ABNORMAL HIGH (ref 0.44–1.00)
GFR, Estimated: 9 mL/min — ABNORMAL LOW (ref >60)
Glucose, Bld: 126 mg/dL — ABNORMAL HIGH (ref 70–99)
Potassium: 3.9 mmol/L (ref 3.5–5.1)
Sodium: 134 mmol/L — ABNORMAL LOW (ref 135–145)
Total Bilirubin: 0.6 mg/dL (ref 0.3–1.2)
Total Protein: 6.8 g/dL (ref 6.5–8.1)

## 2021-10-01 LAB — TSH: TSH: 3.904 u[IU]/mL (ref 0.350–4.500)

## 2021-10-01 NOTE — Progress Notes (Signed)
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 female with a h/o ESRD s/p failed renal transplant now on HD TTS, orthostatic hypotension, chronic diastolic CHF, moderate pHTN, HTN and gout brought to ED from outpatient HD with rapid A.flutter and fever.  Reportedly had fever with heart rates in 150s after 1 hour of HD session.  She had 20 mg IV Cardizem push.  RVR resolved but she became hypotensive.  In ED, febrile to 100.5.  No leukocytosis.  UA, CXR and CT abdomen and pelvis without significant finding.  She was hypotensive but blood pressure improved with 500 cc bolus.  She was in sinus rhythm.  Blood cultures obtained.  She was a started on broad-spectrum antibiotics for SIRS.  The next day, blood pressure improved.  Remained in sinus rhythm.  Blood cultures NGTD. TTE without significant finding.  Cleared by nephrology for discharge.  She felt well and asked to go home.  Nephrology to follow-up on further update of her blood culture and address accordingly.  She was seen in AHF clinc, 04/03/20. It was noted that pt's monitor showed less thatn 1% aflutter  burden and was referred here to see if antiarrythmic's were indicated. Ekg today shows NSR. Pt denies having any tachy arrhythmia's. She has not started eliquis as she was waiting  to have 5 teeth extractions end of April. She has a CHA2DS2VASc score of 6 with a previous stroke. She states that when she had Covid last June, that it caused her to lose her kidney transplant that she had for 16 years done in the [place] area. Hence she had to start back on dialysis. She was started on amiodarone 04/23/20 for rapid a flutter occurring with dialysis, dose ws reduced to 100 mg daily at a later date, for mild elevation of TSH.  F/u in afib clinic, 09/18/20. She had Covid  in June with Pneumonia, She states that she recovered quickly.  EKG today shows  SR. SHe has noted very  little rapid heart rate . Will usually occur at the tail end of dialysis, of very short duration.   F/u in afib clinic, 03/26/21.she has not noted any afib. Continues on amiodarone 200 mg daily. Has dialysis yesterday. Has noted chest congestion x 3-4 days. No fever. Productive cough. Rhonchi heard throughout both lungs. Continues to smoke 1/2 pack a day. PO 97%.   F/u in afib clinic, 10/01/21. She was recently in the hospital early August for N/V associated with gastroparesis. This was acute on chronic. N/V improved while in the hospital but he had a spell of N/V yesterday. At times, she misses her meds 2/2 N/V. She is in SR, no afib episodes to report.   Today, she denies symptoms of palpitations, chest pain, shortness of breath, orthopnea, PND, lower extremity edema, dizziness, presyncope, syncope, or neurologic sequela. The patient is tolerating medications without difficulties and is otherwise without complaint today.   Past Medical History:  Diagnosis Date   Bacteremia due to Gram-negative bacteria 05/23/2011   Blind    right eye   Blind right eye    CHF (congestive heart failure) (HCC)    Chronic lower back pain    Complication of anesthesia    "woke up during OR; I have an extremely high tolerance" (12/11/2011) 1 procedure was graft; the other procedures were procedures that are typically done with sedation.   DDD (degenerative disc disease), cervical  Depression    Dysrhythmia    "tachycardia" (12/11/2011) new onset afib 10/15/14 EKG   E coli bacteremia 06/18/2011   Elevated LDL cholesterol level 12/2018   ESRD (end stage renal disease) ([place]) 06/12/2011   Fibromyalgia    Gastroparesis    Gastroparesis    GERD (gastroesophageal reflux disease)    Glaucoma    right eye   Gout    H/O carpal tunnel syndrome    Headache 10/2019   Headache(784.0)    "not often anymore" (12/11/2011)   Herpes genitalia 1994   History of blood transfusion    "more than a few times" (12/11/2011)    History of stomach ulcers    Hypotension    Iron deficiency anemia    New onset a-fib ([place])    10/15/14 EKG   Osteopenia    Peripheral neuropathy 11/2018   Pressure ulcer of sacral region, stage 1 07/2019   Seizures ([place]) 1994   "post transplant; only have had that one" (12/11/2011)   Spinal stenosis in cervical region    Stroke [facility])     left basal ganglia lacunar infarct; Right frontal lobe lacunar infarct.   Stroke [facility]) ~ 1999; 2001   "briefly lost my vision; lost my right eye" (12/11/2011)   Vitamin D deficiency 12/2018   Past Surgical History:  Procedure Laterality Date   ANTERIOR CERVICAL DECOMP/DISCECTOMY FUSION N/A 01/08/2015   Procedure: Anterior Cervical Three-Four/Four-Five Decompression/Diskectomy/Fusion;  Surgeon: Leeroy Cha, MD;  Location: [facility];  Service: Neurosurgery;  Laterality: N/A;  C3-4 C4-5 Anterior cervical decompression/diskectomy/fusion   APPENDECTOMY  ~ 2004   BIOPSY  09/12/2020   Procedure: BIOPSY;  Surgeon: Milus Banister, MD;  Location: [facility];  Service: Endoscopy;;   CATARACT EXTRACTION     right eye   COLONOSCOPY     COLONOSCOPY WITH PROPOFOL N/A 09/12/2020   Procedure: COLONOSCOPY WITH PROPOFOL;  Surgeon: Milus Banister, MD;  Location: [facility];  Service: Endoscopy;  Laterality: N/A;   ENUCLEATION  2001   "right"   ESOPHAGOGASTRODUODENOSCOPY (EGD) WITH PROPOFOL N/A 04/21/2012   Procedure: ESOPHAGOGASTRODUODENOSCOPY (EGD) WITH PROPOFOL;  Surgeon: Milus Banister, MD;  Location: [facility];  Service: Endoscopy;  Laterality: N/A;   ESOPHAGOGASTRODUODENOSCOPY (EGD) WITH PROPOFOL N/A 09/12/2020   Procedure: ESOPHAGOGASTRODUODENOSCOPY (EGD) WITH PROPOFOL;  Surgeon: Milus Banister, MD;  Location: [facility];  Service: Endoscopy;  Laterality: N/A;   INSERTION OF DIALYSIS CATHETER  1988   "AV graft LUA & LFA; LUA worked for 1 day; LFA never worked"   IR FLUORO GUIDE CV LINE RIGHT  07/11/2019   IR FLUORO GUIDE CV LINE RIGHT  10/20/2019    IR FLUORO GUIDE CV LINE RIGHT  05/31/2020   IR PTA VENOUS EXCEPT DIALYSIS CIRCUIT  05/31/2020   IR RADIOLOGY PERIPHERAL GUIDED IV START  07/11/2019   IR US GUIDE VASC ACCESS RIGHT  07/11/2019   IR US GUIDE VASC ACCESS RIGHT  07/11/2019   IR US GUIDE VASC ACCESS RIGHT  07/11/2019   IR VENOCAVAGRAM IVC  05/31/2020   KIDNEY TRANSPLANT  1994; 1999; 2005   "right"   MULTIPLE TOOTH EXTRACTIONS     POLYPECTOMY  09/12/2020   Procedure: POLYPECTOMY;  Surgeon: Milus Banister, MD;  Location: [facility];  Service: Endoscopy;;   RIGHT HEART CATH N/A 03/23/2019   Procedure: RIGHT HEART CATH;  Surgeon: Jolaine Artist, MD;  Location: [facility];  Service: Cardiovascular;  Laterality: N/A;   TONSILLECTOMY     TOTAL  NEPHRECTOMY  1988?; 1994; 2005    Current Outpatient Medications  Medication Sig Dispense Refill   allopurinol (ZYLOPRIM) 100 MG tablet TAKE 1 TABLET BY MOUTH EVERY DAY (Patient taking differently: Take 100 mg by mouth daily.) 90 tablet 3   amiodarone (PACERONE) 200 MG tablet Take 1 tablet (200 mg total) by mouth daily. 30 tablet 3   apixaban (ELIQUIS) 2.5 MG TABS tablet Take 1 tablet (2.5 mg total) by mouth 2 (two) times daily. 60 tablet 3   B Complex-C-Zn-Folic Acid (DIALYVITE 425-ZDGL 15) 0.8 MG TABS Take 0.8 mg by mouth daily.     busPIRone (BUSPAR) 10 MG tablet Take 5 mg by mouth 2 (two) times daily.     calcium acetate (PHOSLO) 667 MG capsule Take 667 mg by mouth 3 (three) times daily with meals.     Colchicine 0.6 MG CAPS Take 0.6 mg by mouth daily as needed (gout flare up).     diclofenac Sodium (VOLTAREN) 1 % GEL APPLY 2 GRAMS TO AFFECTED AREA 4 TIMES A DAY (Patient taking differently: Apply 2 g topically 4 (four) times daily.) 200 g 2   diphenhydrAMINE (BENADRYL) 25 mg capsule Take 1 capsule (25 mg total) by mouth every 8 (eight) hours as needed. (Patient taking differently: Take 25 mg by mouth every 8 (eight) hours as needed for itching.) 30 capsule 6   escitalopram (LEXAPRO)  20 MG tablet Take 20 mg by mouth daily.     famotidine (PEPCID) 20 MG tablet Take 20 mg by mouth 2 (two) times daily.     fluticasone (FLONASE) 50 MCG/ACT nasal spray Place 1 spray into both nostrils daily as needed for allergies or rhinitis.     fluticasone-salmeterol (ADVAIR HFA) 115-21 MCG/ACT inhaler Inhale 2 puffs into the lungs 2 (two) times daily. 1 each 12   gabapentin (NEURONTIN) 100 MG capsule TAKE 1 CAPSULE BY MOUTH TWICE A DAY MAX 2 CAPSULES DAILY (Patient taking differently: Take 100 mg by mouth 2 (two) times daily.) 60 capsule 1   latanoprost (XALATAN) 0.005 % ophthalmic solution Place 1 drop into the left eye at bedtime.  3   lidocaine (LIDODERM) 5 % Place 1 patch onto the skin daily as needed (for pain- remove old patch first).      Methoxy PEG-Epoetin Beta (MIRCERA IJ) Inject 1 Dose as directed See admin instructions. Unknown to pt     metoCLOPramide (REGLAN) 5 MG tablet TAKE 1 TABLET (5 MG TOTAL) BY MOUTH 2 (TWO) TIMES DAILY BEFORE A MEAL. 180 tablet 3   midodrine (PROAMATINE) 10 MG tablet Take 1.5 tablets (15 mg total) by mouth 3 (three) times daily. 405 tablet 3   omeprazole (PRILOSEC) 40 MG capsule TAKE 1 CAPSULE (40 MG TOTAL) BY MOUTH DAILY. 90 capsule 1   ondansetron (ZOFRAN-ODT) 4 MG disintegrating tablet Take 1 tablet (4 mg total) by mouth every 8 (eight) hours as needed for nausea or vomiting. 20 tablet 11   oxyCODONE-acetaminophen (PERCOCET) 10-325 MG tablet Take 1 tablet by mouth 4 (four) times daily as needed for pain.     Vitamin D, Ergocalciferol, (DRISDOL) 1.25 MG (50000 UNIT) CAPS capsule TAKE 1 CAPSULE (50,000 UNITS TOTAL) BY MOUTH EVERY 7 (SEVEN) DAYS (Patient taking differently: Take 50,000 Units by mouth every 7 (seven) days.) 5 capsule 6   zolpidem (AMBIEN) 10 MG tablet Take 1 tablet (10 mg total) by mouth at bedtime as needed for sleep. 10 tablet 0   No current facility-administered medications for this encounter.  Allergies  Allergen Reactions    Levofloxacin Itching and Rash   Tape Other (See Comments)    "Certain surgical tapes peel off my skin"    Social History   Socioeconomic History   Marital status: Single    Spouse name: Not on file   Number of children: 0   Years of education: some college   Highest education level: Some college, no degree  Occupational History   Occupation: disabled    Fish farm manager: UNEMPLOYED  Tobacco Use   Smoking status: Every Day    Packs/day: 1.50    Years: 35.00    Total pack years: 52.50    Types: Cigarettes    Passive exposure: Never   Smokeless tobacco: Never   Tobacco comments:    Pack of cigarettes lasts 2 days 09/18/2020  Vaping Use   Vaping Use: Never used  Substance and Sexual Activity   Alcohol use: Not Currently    Comment: rarely   Drug use: Yes    Frequency: 1.0 times per week    Types: Marijuana    Comment: occ   Sexual activity: Not Currently    Partners: Male    Birth control/protection: None  Other Topics Concern   Not on file  Social History Narrative   Right-handed.   Four cups caffeine per day.   Lives alone.   Social Determinants of Health   Financial Resource Strain: Low Risk  (04/24/2021)   Overall Financial Resource Strain (CARDIA)    Difficulty of Paying Living Expenses: Not hard at all  Food Insecurity: No Food Insecurity (04/24/2021)   Hunger Vital Sign    Worried About Running Out of Food in the Last Year: Never true    Ran Out of Food in the Last Year: Never true  Transportation Needs: No Transportation Needs (04/24/2021)   PRAPARE - Hydrologist (Medical): No    Lack of Transportation (Non-Medical): No  Physical Activity: Insufficiently Active (04/24/2021)   Exercise Vital Sign    Days of Exercise per Week: 4 days    Minutes of Exercise per Session: 20 min  Stress: Stress Concern Present (04/24/2021)   Experiment    Feeling of Stress : Rather much   Social Connections: Moderately Isolated (04/24/2021)   Social Connection and Isolation Panel [NHANES]    Frequency of Communication with Friends and Family: Three times a week    Frequency of Social Gatherings with Friends and Family: Once a week    Attends Religious Services: 1 to 4 times per year    Active Member of [employer] or Organizations: No    Attends Archivist Meetings: Never    Marital Status: Never married  Intimate Partner Violence: Not At Risk (04/24/2021)   Humiliation, Afraid, Rape, and Kick questionnaire    Fear of Current or Ex-Partner: No    Emotionally Abused: No    Physically Abused: No    Sexually Abused: No    Family History  Problem Relation Age of Onset   Glaucoma Mother    Pancreatic cancer Father    Multiple sclerosis Brother    Diabetes Maternal Uncle    Hypertension Maternal Grandmother    Breast cancer Neg Hx     ROS- All systems are reviewed and negative except as per the HPI above  Physical Exam: Vitals:   10/01/21 0946  BP: (!) 86/48  Pulse: 64  Weight: 50.8 kg  Height: '4\' 11"'$  (  1.499 m)   Wt Readings from Last 3 Encounters:  10/01/21 50.8 kg  09/26/21 50.2 kg  09/07/21 43.6 kg    Labs: Lab Results  Component Value Date   NA 135 09/07/2021   K 3.7 09/07/2021   CL 97 (L) 09/07/2021   CO2 25 09/07/2021   GLUCOSE 84 09/07/2021   BUN 36 (H) 09/07/2021   CREATININE 5.85 (H) 09/07/2021   CALCIUM 9.1 09/07/2021   PHOS 3.3 08/24/2019   MG 1.7 06/14/2011   Lab Results  Component Value Date   INR 1.0 02/08/2021   Lab Results  Component Value Date   CHOL 202 (H) 07/14/2021   HDL 113 07/14/2021   LDLCALC 79 07/14/2021   TRIG 51 07/14/2021     GEN- The patient is well appearing, alert and oriented x 3 today.   Head- normocephalic, atraumatic Eyes-  Sclera clear, conjunctiva pink Ears- hearing intact Oropharynx- clear Neck- supple, no JVP Lymph- no cervical lymphadenopathy Lung bilateral rhonchi bilaterally, normal  work of breathing Heart- Regular rate and rhythm, 2/6 systolic murmur, no rubs or gallops, PMI not laterally displaced GI- soft, NT, ND, + BS Extremities- no clubbing, cyanosis, or edema MS- no significant deformity or atrophy Skin- no rash or lesion Psych- euthymic mood, full affect Neuro- strength and sensation are intact  EKG-  Vent. rate 64 BPM PR interval 186 ms QRS duration 78 ms QT/QTcB 450/464 ms P-R-T axes 83 86 71 Normal sinus rhythm Lateral infarct , age undetermined Abnormal ECG When compared with ECG of 07-Sep-2021 05:04, PREVIOUS ECG IS PRESENT  Echo- 1. Left ventricular ejection fraction, by estimation, is 65 to 70%. The  left ventricle has normal function. The left ventricle has no regional  wall motion abnormalities. Left ventricular diastolic parameters were  normal.   2. Right ventricular systolic function is normal. The right ventricular  size is normal. There is normal pulmonary artery systolic pressure.   3. The mitral valve is normal in structure. No evidence of mitral valve  regurgitation. No evidence of mitral stenosis.   4. The aortic valve is normal in structure. Aortic valve regurgitation is  not visualized. No aortic stenosis is present.   5. The inferior vena cava is normal in size with greater than 50%  respiratory variability, suggesting right atrial pressure of 3 mmHg.   Zio patch-Patient had a min HR of 52 bpm, max HR of 208 bpm, and avg HR of 78 bpm. Predominant underlying rhythm was Sinus Rhythm. Atrial Flutter occurred (<1% burden), ranging from 113-208 bpm (avg of 148 bpm), the longest lasting 1 hour 16 mins with an avg rate of 139 bpm. Atrial Flutter was detected within +/- 45 seconds of symptomatic patient event(s).  Stress test- 04/12/20-Study Highlights    The left ventricular ejection fraction is hyperdynamic (>65%). Nuclear stress EF: 75%. There was no ST segment deviation noted during stress. Defect 1: There is a medium defect  of mild severity present in the mid anterior location. The study is normal. This is a low risk study.  EKG -Sinus brady at 58 bpm, pr int 184 ms, qrs int 74 ms, qtc 508 ms   Assessment and Plan: 1. Atrial  flutter  Doing well staying in rhythm with daily amiodarone. I had her at 100 mg daily but her RX was refilled by another provider in the spring of this year to 200 mg daily. She was doing well with the 100 mg dose so advised to take this amount.  Cannot  use daily rate control for hypotension  Amiodarone is her choice of drug 2/2 ESRD and HD Tsh/cmet today  2. CHA2DS2VASc score of 6 Eliquis 2.5 mg bid for weight less than 60 KG and ESRD  3. Hypotension  Stable, not symptomatic   4. Gastroparesis Has spells of N/V, recently treated in August at [facility] for same  Makes it difficult for her to keep down her meds down  at times She states she  does the best she can     F/u in afib clinic in 6 months  Butch Penny C. Lynley Killilea, [facility] [address] [place], [place] [address] [phone]

## 2021-10-01 NOTE — Therapy (Signed)
OUTPATIENT PHYSICAL / Discharge    Patient Name: Tina Watkins MRN: [id number] DOB:1965-04-11, 56 y.o., female Today's Date: 10/01/2021  PCP: Teena Dunk, NP REFERRING PROVIDER: Collier Salina, MD  END OF SESSION:   PT End of Session - 10/01/21 2153     Visit Number 9    Date for PT Re-Evaluation 10/03/21    Authorization Type MEDICARE PART A AND B; MEDICAID [place] ACCESS    PT Start Time 1555    PT Stop Time 1640    PT Time Calculation (min) 45 min    Activity Tolerance Patient tolerated treatment well;Patient limited by pain    Behavior During Therapy [facility] for tasks assessed/performed                    Past Medical History:  Diagnosis Date   Bacteremia due to Gram-negative bacteria 05/23/2011   Blind    right eye   Blind right eye    CHF (congestive heart failure) ([place])    Chronic lower back pain    Complication of anesthesia    "woke up during OR; I have an extremely high tolerance" (12/11/2011) 1 procedure was graft; the other procedures were procedures that are typically done with sedation.   DDD (degenerative disc disease), cervical    Depression    Dysrhythmia    "tachycardia" (12/11/2011) new onset afib 10/15/14 EKG   E coli bacteremia 06/18/2011   Elevated LDL cholesterol level 12/2018   ESRD (end stage renal disease) ([place]) 06/12/2011   Fibromyalgia    Gastroparesis    Gastroparesis    GERD (gastroesophageal reflux disease)    Glaucoma    right eye   Gout    H/O carpal tunnel syndrome    Headache 10/2019   Headache(784.0)    "not often anymore" (12/11/2011)   Herpes genitalia 1994   History of blood transfusion    "more than a few times" (12/11/2011)   History of stomach ulcers    Hypotension    Iron deficiency anemia    New onset a-fib ([place])    10/15/14 EKG   Osteopenia    Peripheral neuropathy 11/2018   Pressure ulcer of sacral region, stage 1 07/2019   Seizures ([place]) 1994   "post transplant; only have had that one"  (12/11/2011)   Spinal stenosis in cervical region    Stroke [facility])     left basal ganglia lacunar infarct; Right frontal lobe lacunar infarct.   Stroke [facility]) ~ 1999; 2001   "briefly lost my vision; lost my right eye" (12/11/2011)   Vitamin D deficiency 12/2018   Past Surgical History:  Procedure Laterality Date   ANTERIOR CERVICAL DECOMP/DISCECTOMY FUSION N/A 01/08/2015   Procedure: Anterior Cervical Three-Four/Four-Five Decompression/Diskectomy/Fusion;  Surgeon: Leeroy Cha, MD;  Location: [facility]URO ORS;  Service: Neurosurgery;  Laterality: N/A;  C3-4 C4-5 Anterior cervical decompression/diskectomy/fusion   APPENDECTOMY  ~ 2004   BIOPSY  09/12/2020   Procedure: BIOPSY;  Surgeon: Milus Banister, MD;  Location: [facility] ENDOSCOPY;  Service: Endoscopy;;   CATARACT EXTRACTION     right eye   COLONOSCOPY     COLONOSCOPY WITH PROPOFOL N/A 09/12/2020   Procedure: COLONOSCOPY WITH PROPOFOL;  Surgeon: Milus Banister, MD;  Location: [facility] ENDOSCOPY;  Service: Endoscopy;  Laterality: N/A;   ENUCLEATION  2001   "right"   ESOPHAGOGASTRODUODENOSCOPY (EGD) WITH PROPOFOL N/A 04/21/2012   Procedure: ESOPHAGOGASTRODUODENOSCOPY (EGD) WITH PROPOFOL;  Surgeon: Milus Banister, MD;  Location: [facility];  Service:  Endoscopy;  Laterality: N/A;   ESOPHAGOGASTRODUODENOSCOPY (EGD) WITH PROPOFOL N/A 09/12/2020   Procedure: ESOPHAGOGASTRODUODENOSCOPY (EGD) WITH PROPOFOL;  Surgeon: Milus Banister, MD;  Location: [facility];  Service: Endoscopy;  Laterality: N/A;   INSERTION OF DIALYSIS CATHETER  1988   "AV graft LUA & LFA; LUA worked for 1 day; LFA never worked"   IR FLUORO GUIDE CV LINE RIGHT  07/11/2019   IR FLUORO GUIDE CV LINE RIGHT  10/20/2019   IR FLUORO GUIDE CV LINE RIGHT  05/31/2020   IR PTA VENOUS EXCEPT DIALYSIS CIRCUIT  05/31/2020   IR RADIOLOGY PERIPHERAL GUIDED IV START  07/11/2019   IR US GUIDE VASC ACCESS RIGHT  07/11/2019   IR US GUIDE VASC ACCESS RIGHT  07/11/2019   IR US GUIDE VASC ACCESS RIGHT  07/11/2019    IR VENOCAVAGRAM IVC  05/31/2020   KIDNEY TRANSPLANT  1994; 1999; 2005   "right"   MULTIPLE TOOTH EXTRACTIONS     POLYPECTOMY  09/12/2020   Procedure: POLYPECTOMY;  Surgeon: Milus Banister, MD;  Location: [facility];  Service: Endoscopy;;   RIGHT HEART CATH N/A 03/23/2019   Procedure: RIGHT HEART CATH;  Surgeon: Jolaine Artist, MD;  Location: [facility] CV LAB;  Service: Cardiovascular;  Laterality: N/A;   [place]?; 1994; 2005   Patient Active Problem List   Diagnosis Date Noted   Prolonged Q-T interval on ECG 09/07/2021   Nausea & vomiting 09/06/2021   Bilateral shoulder pain 06/06/2021   Bilateral hand swelling 06/06/2021   Cervical radiculitis 01/24/2021   Acute respiratory failure with hypoxia ([place]) 10/23/2020   Coagulation defect, unspecified ([place]) 08/01/2020   Aortic atherosclerosis ([place]) 07/10/2020   Secondary hypercoagulable state ([place]) 04/23/2020   Atrial flutter ([place]) 03/29/2020   Chronic pain of both knees 03/13/2020   Chills (without fever) 12/12/2019   Other pancytopenia ([place])    Decubitus ulcer of sacral region, stage 1 08/18/2019   HCAP (healthcare-associated pneumonia) 08/05/2019   Pressure injury of skin 07/24/2019   Chest congestion 07/24/2019   Itching 07/24/2019   Weakness 06/13/2019   Pneumonia due to COVID-19 virus 05/09/2019   Hypotension 05/09/2019   Symptomatic anemia 05/09/2019   CHF (congestive heart failure) ([place]) 05/09/2019   Swollen abdomen 01/04/2019   DDD (degenerative disc disease), cervical 12/06/2018   Neuropathy 12/06/2018   Paresthesia 10/11/2018   Neck pain 10/11/2018   Tobacco abuse 11/20/2017   Right leg swelling 11/20/2017   Right leg pain 11/20/2017   Stroke (cerebrum) ([place]) 11/20/2017   GERD (gastroesophageal reflux disease) 11/20/2017   Acute venous embolism and thrombosis of deep vessels of proximal lower extremity, right ([place]) 11/20/2017   Chronic gout due to renal impairment  involving toe of left foot without tophus    Thrush, oral    AKI (acute kidney injury) ([place])    Multifocal pneumonia 08/09/2017   ETD (Eustachian tube dysfunction), bilateral 08/03/2017   Acute recurrent pansinusitis 07/21/2017   Chest pain 09/29/2015   Cervical stenosis of spinal canal 01/08/2015   Urinary tract infectious disease    Muscle spasms of neck 06/15/2014   Bleeding hemorrhoid 06/15/2014   Anemia in chronic kidney disease 06/15/2014   Pyelonephritis, acute 06/13/2014   Sepsis ([place]) 06/13/2014   History of kidney transplant    Abnormal CT scan 09/14/2012   Chronic pain syndrome 04/09/2012   Dehydration, mild 04/09/2012   Herpes infection 06/23/2011   Anxiety 06/23/2011   E coli bacteremia 06/18/2011  ESRD (end stage renal disease) ([place]) 06/12/2011   UTI (lower urinary tract infection) 06/12/2011   Bacteremia due to Gram-negative bacteria 05/23/2011   History of renal transplantation 05/22/2011   Septic shock(785.52) 05/22/2011   Acute on chronic kidney failure ([place]) 05/22/2011   Gastroparesis 04/24/2008   WEIGHT LOSS 08/24/2007   NAUSEA AND VOMITING 08/24/2007    REFERRING DIAG: M25.511,G89.29,M25.512 (ICD-10-CM) - Chronic pain of both shoulders  THERAPY DIAG:  Chronic left shoulder pain  Chronic right shoulder pain  Stiffness of left shoulder, not elsewhere classified  Stiffness of right shoulder, not elsewhere classified  Muscle weakness (generalized)  Rationale for Evaluation and Treatment Rehabilitation  SUBJECTIVE:                                                                                                                                                                                       SUBJECTIVE STATEMENT: Pt reports she receiving cortisone injections on 8/25 and her bilat shoulder pain is much better.    PERTINENT HISTORY DDD cervical, fibromyalgia, spinal stenosis cervical, CHF   PAIN:  Are you having pain? Yes: NPRS scale: L 5/10,  R 5/10 Pain location: Upper shoulders to [place] jt to upper arms Pain description: ache and throb, dull , spasms  Aggravating factors: Just using my arms Relieving factors: Meds, massage , heat  Pain range: 5-9/10  PERTINENT HISTORY: DDD cervical, fibromyalgia, spinal stenosis cervical, CHF   PRECAUTIONS: None   PATIENT GOALS More strength and mobility of her  shoulders   DIAGNOSTIC FINDINGS:  X-ray right shoulder 4 views 06/20/21  Glenohumeral joint space appears normal.  Normal internal and external  rotation of humerus.  AC joint space appears normal.  No visible erosions  or abnormal calcifications seen.   Impression   Normal appearing shoulder x-ray   X-ray left shoulder 4 views 06/20/21  Glenohumeral joint space appears normal.  Normal internal and external  rotation of humerus.  AC joint space appears normal.  No visible erosions  or abnormal calcifications seen.   Impression   Normal appearing shoulder x-ray   PATIENT SURVEYS:  FOTO 58% predicted 64%   COGNITION:           Overall cognitive status: Within functional limits for tasks assessed                                  SENSATION: WFL   POSTURE: Forward head and rounded shoulders   UPPER EXTREMITY ROM:     AROM/AAROM/PROM Right eval Left eval Rt AROM 08/06/21 L AROM 08/06/21 R Arom 08/20/21 L Arom 08/20/21  R AROM 09/03/21 L ARO8/2/48M R AROM 10/01/21 L AROM 10/01/21  Shoulder flexion 85/118/115 88/110/110 105 107 110 110 101 100 110 114  Shoulder extension              Shoulder abduction              Shoulder adduction              Shoulder internal rotation              Shoulder external rotation              Elbow flexion              Elbow extension              Wrist flexion              Wrist extension              Wrist ulnar deviation              Wrist radial deviation              Wrist pronation              Wrist supination              (Blank rows = not tested)  110, 115    UPPER EXTREMITY MMT: Tested strength in a neutral position due to pain. Pt demonstrated strength against resistance seeming at least 3/5. No overt weakness noted. MMT Right 7/17 Left 7/17 Rt 09/03/21 Lt 09/03/21 R 09/3021 L 10/01/21  Shoulder flexion  2+  3- $R'3 3 4 4  'Ku$ Shoulder extension          Shoulder abduction     '3 3 4 4  '$ Shoulder adduction          Shoulder internal rotation  4-  $R'4 4 4 4 4  'Lu$ Shoulder external rotation  3  3+ 3+ 3+ 4 4  Middle trapezius          Lower trapezius          Elbow flexion          Elbow extension          Wrist flexion          Wrist extension          Wrist ulnar deviation          Wrist radial deviation          Wrist pronation          Wrist supination          Grip strength (lbs)          (Blank rows = not tested)   SHOULDER SPECIAL TESTS:            Impingement tests: Hawkins/Kennedy impingement test: positive             Rotator cuff assessment: Empty can test: positive  and Full can test: positive             Both tests were painful            Biceps assessment: Yergason's test: positive    PALPATION:  TTP to the upper trap and peri-GH area             TODAY'S TREATMENT:  [facility] Adult PT Treatment:  DATE: 10/01/21 Therapeutic Exercise: Pulleys standing facing anchor overhead Shoulder flexion c counter x5 5" each Shoulder ER c able top x5 5" each Shoulder row 2x10 YTB Shoulder ext 2x10 YTB HEP udated  Self Care: Provided info regarding purchasing an overhead pulley   [facility] Adult PT Treatment:                                                DATE: 09/19/21 Therapeutic Exercise: Pulleys standing facing anchor overhead NuStep UEs > LEs for 5 min  Isometric ex for flex, abd, ext x5 10" Manual Therapy: PROM all planes bilaterally with light distraction in various angles Oscillation and GH mobs to R/L shoulder  Modalities  [facility] Adult PT Treatment:                                                 DATE: 09/05/21 Therapeutic Exercise: Pulleys standing facing anchor overhead NuStep UEs > LEs for 5 min  Supine cane chest press x 10  Overhead lift x 10  Manual Therapy: PROM all planes bilaterally with light distraction in various angles PROM  Flexion 115-120  Abduction 95 deg ER 40 deg IR 60 deg  Oscillation and GH mobs to R/L shoulder  Self care: POC, progress, FOTO score  [facility] Adult PT Treatment:                                                DATE: 09/03/21 Shoulder pulley flexion 2 mins Shoulder row 2x10 YTB Shoulder ext 2x10 YTB Pendulum flex and ext c 4# Table slides for UE flexion x 10, scaption  x 10 each Bilat shoulder ER YTB x10, min ROM  Supine chest press AAROM shoulder flexion  x 10, slow and painful  Manual Therapy: PROM all planes bilaterally with light distraction in various angles   PATIENT EDUCATION: Education details: HEP, POC,  Person educated: Patient Education method: Explanation, Demonstration, Tactile cues, Verbal cues, and Handouts Education comprehension: verbalized understanding, returned demonstration, verbal cues required, and tactile cues required     HOME EXERCISE PROGRAM: Access Code: [email or web address] URL: [email or web address] Date: 10/01/2021 Prepared by: Gar Ponto  Exercises - Seated Shoulder Flexion Slide at Table Top with Forearm in Neutral  - 2 x daily - 7 x weekly - 1 sets - 10 reps - 3 hold - Seated Shoulder External Rotation PROM on Table  - 2 x daily - 7 x weekly - 2 sets - 10 reps - 5 hold - Standing Shoulder Row with Anchored Resistance  - 2 x daily - 7 x weekly - 1 sets - 10 reps - 3 hold - Shoulder extension with resistance - Neutral  - 2 x daily - 7 x weekly - 1 sets - 5-10 reps - 3 hold   ASSESSMENT:   CLINICAL IMPRESSION: Pt presents to PT after receiving steroid injections. With her bilat shoulder pain much improved. With improved pain is improved use of both shoulders with better AROM and strength. Each  time the pt has received injections for her shoulders their function increases. Pt is Ind in a  HEP to maintain use of her shoulders and she was provided info as to how to obtain an over the door shoulder ROM pulley.Pt is Dced with the majority of PT goals met.  OBJECTIVE IMPAIRMENTS decreased activity tolerance, decreased ROM, decreased strength, increased fascial restrictions, increased muscle spasms, impaired UE functional use, postural dysfunction, and pain.    ACTIVITY LIMITATIONS carrying, lifting, bending, sleeping, bed mobility, bathing, toileting, dressing, reach over head, hygiene/grooming, and caring for others   PARTICIPATION LIMITATIONS: meal prep, cleaning, laundry, driving, shopping, community activity, and yard work   PERSONAL FACTORS Past/current experiences, Time since onset of injury/illness/exacerbation, and 3+ comorbidities:    DDD cervical, fibromyalgia, spinal stenosis cervical are also affecting patient's functional outcome.    REHAB POTENTIAL: Fair due to chronicity   CLINICAL DECISION MAKING: Evolving/moderate complexity   EVALUATION COMPLEXITY: Moderate     GOALS:     LONG TERM GOALS: Target date: 09/05/21    Pt's shoulder AROM will increase to R 105d and L 100d for improved shoulder function Baseline: about 90 deg today Status: Lt 110 deg, Rt 116 deg Goal status: MET   2.  Pt will demonstrate 4/5 shoulder strength for improved shoulder function Baseline: 3/5 to 3+/5    STATUS: see flow sheet Goal status: MET   3.  Pt's FOTO score will improve to 64% as indication of improved function Baseline: 48% now 55% on Re-assess Goal status:Improved   4.  Pt will be Ind in a HEP to maintain achieved LOF Baseline:  Status: Verbal cueing for proper completion  Goal status: MET    PLAN: PT FREQUENCY: 2x/week   PT DURATION: 3 weeks   PLANNED INTERVENTIONS: Therapeutic exercises, Therapeutic activity, Patient/Family education, Joint mobilization, Aquatic  Therapy, Dry Needling, Cryotherapy, Moist heat, Taping, Ultrasound, Ionotophoresis 4mg /ml Dexamethasone, Manual therapy, and Re-evaluation   PLAN FOR NEXT SESSION:    Gar Ponto MS, PT 10/01/21 10:10 PM  PHYSICAL THERAPY DISCHARGE SUMMARY  Visits from Start of Care: 9  Current functional level related to goals / functional outcomes: See clinical impression and goals   Remaining deficits: see clinical impression and goals   Education / Equipment: HEP   Patient agrees to discharge. Patient goals were partially met. Patient is being discharged due to  maximized rehab potential.

## 2021-10-02 NOTE — Addendum Note (Signed)
Encounter addended by: Sherran Needs, NP on: 10/02/2021 11:09 AM  Actions taken: Clinical Note Signed

## 2021-10-10 ENCOUNTER — Emergency Department (HOSPITAL_COMMUNITY)
Admission: EM | Admit: 2021-10-10 | Discharge: 2021-10-10 | Disposition: A | Discharge status: Home / Self Care | Payer: Medicare Other | Attending: Emergency Medicine | Admitting: Emergency Medicine

## 2021-10-10 ENCOUNTER — Other Ambulatory Visit: Payer: Self-pay

## 2021-10-10 DIAGNOSIS — Z5321 Procedure and treatment not carried out due to patient leaving prior to being seen by health care provider: Secondary | ICD-10-CM | POA: Insufficient documentation

## 2021-10-10 DIAGNOSIS — Z992 Dependence on renal dialysis: Secondary | ICD-10-CM | POA: Insufficient documentation

## 2021-10-10 DIAGNOSIS — R112 Nausea with vomiting, unspecified: Secondary | ICD-10-CM | POA: Diagnosis not present

## 2021-10-10 DIAGNOSIS — R109 Unspecified abdominal pain: Secondary | ICD-10-CM | POA: Insufficient documentation

## 2021-10-10 LAB — LIPASE, BLOOD: Lipase: 41 U/L (ref 11–51)

## 2021-10-10 LAB — COMPREHENSIVE METABOLIC PANEL
ALT: 25 U/L (ref 0–44)
AST: 26 U/L (ref 15–41)
Albumin: 4.4 g/dL (ref 3.5–5.0)
Alkaline Phosphatase: 140 U/L — ABNORMAL HIGH (ref 38–126)
Anion gap: 18 — ABNORMAL HIGH (ref 5–15)
BUN: 34 mg/dL — ABNORMAL HIGH (ref 6–20)
CO2: 21 mmol/L — ABNORMAL LOW (ref 22–32)
Calcium: 10.6 mg/dL — ABNORMAL HIGH (ref 8.9–10.3)
Chloride: 100 mmol/L (ref 98–111)
Creatinine, Ser: 5.16 mg/dL — ABNORMAL HIGH (ref 0.44–1.00)
GFR, Estimated: 9 mL/min — ABNORMAL LOW (ref >60)
Glucose, Bld: 71 mg/dL (ref 70–99)
Potassium: 4 mmol/L (ref 3.5–5.1)
Sodium: 139 mmol/L (ref 135–145)
Total Bilirubin: 0.8 mg/dL (ref 0.3–1.2)
Total Protein: 7.2 g/dL (ref 6.5–8.1)

## 2021-10-10 LAB — CBC
HCT: 34.1 % — ABNORMAL LOW (ref 36.0–46.0)
Hemoglobin: 10.6 g/dL — ABNORMAL LOW (ref 12.0–15.0)
MCH: 28 pg (ref 26.0–34.0)
MCHC: 31.1 g/dL (ref 30.0–36.0)
MCV: 90.2 fL (ref 80.0–100.0)
Platelets: 172 10*3/uL (ref 150–400)
RBC: 3.78 MIL/uL — ABNORMAL LOW (ref 3.87–5.11)
RDW: 25.9 % — ABNORMAL HIGH (ref 11.5–15.5)
WBC: 4.9 10*3/uL (ref 4.0–10.5)
nRBC: 0.4 % — ABNORMAL HIGH (ref 0.0–0.2)

## 2021-10-10 MED ORDER — ONDANSETRON 4 MG PO TBDP
4.0000 mg | ORAL_TABLET | Freq: Once | ORAL | Status: AC
  Administered 2021-10-10: 4 mg via ORAL
  Filled 2021-10-10: qty 1

## 2021-10-10 NOTE — ED Triage Notes (Addendum)
Pt. Stated, My gastroparesis is flared up. I have a stomach ache and N/v Went to dialysis yesterday but only stayed on for 1 hour because of stomach pain

## 2021-10-10 NOTE — ED Notes (Addendum)
PATIENT LEFT AMA AFTER TRIAGE

## 2021-10-16 ENCOUNTER — Telehealth: Payer: Self-pay | Admitting: Internal Medicine

## 2021-10-16 NOTE — Telephone Encounter (Signed)
Patient called stating she had a back injection at her last appointment on 09/26/21.  Patient states the pain has returned in her shoulders and upper back.  Patient requested a return call.

## 2021-10-22 IMAGING — DX DG CHEST 1V PORT
1 series · 1 of 1 positions shown · non-contrast
Comparison: 07/07/2019

CLINICAL DATA: Fever. Dialysis in the RIGHT leg. Swelling of the
RIGHT leg yesterday with progressing pain today. Patient missed
dialysis today.

EXAM:
PORTABLE CHEST 1 VIEW

[chest ap]
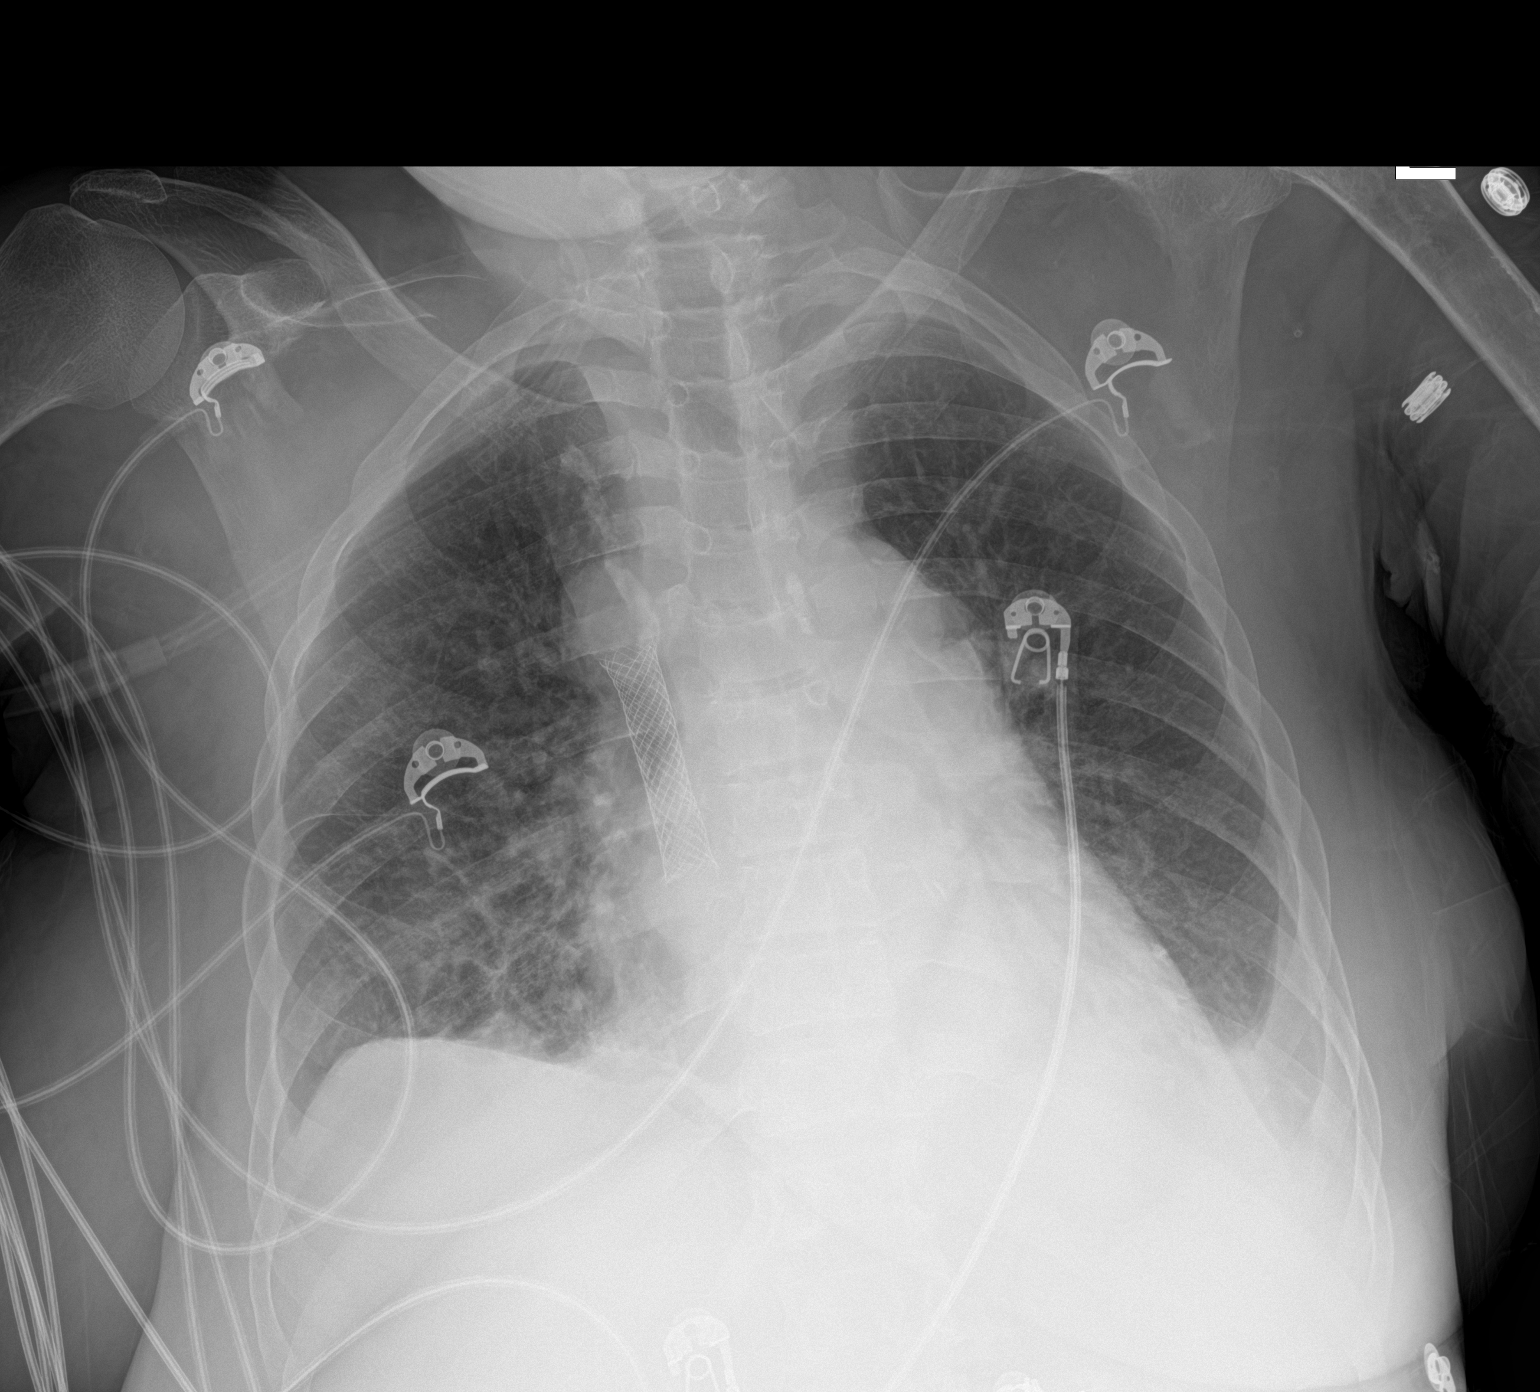

[1 of 1 positions shown; findings below may reference images not displayed]

FINDINGS: Heart size is within normal limits. Interval development of LEFT
LOWER lobe consolidation/infiltrate with associated air
bronchograms. Increased LEFT pleural effusion.

Prominent interstitial markings are consistent with mild pulmonary
edema and are increased. SVC stent.
IMPRESSION: 1. Interval development of LEFT LOWER lobe consolidation/infiltrate
and LEFT pleural effusion.
2. Increased interstitial pulmonary edema.

## 2021-10-22 NOTE — Telephone Encounter (Signed)
I spoke with Tina Watkins she reports not having a good improvement in her shoulder and back pain after our recent steroid injections last month.  She felt these were more painful during procedure and less effective compared to when she had shoulder injection in the past.  I think is reasonable to try this again possibly from glenohumeral joint approach as an alternative that might work better.  However I recommend as a first option she could try to follow-up with her orthopedist who had greater success for her in the past.  Previously saw [facility] she will plan to call them about scheduling a follow-up appointment.  If she needs a new referral or other records sent over instructed her to call our office back and we can help out.

## 2021-10-26 ENCOUNTER — Other Ambulatory Visit: Payer: Self-pay | Admitting: Nurse Practitioner

## 2021-10-31 MED ORDER — TRIAMCINOLONE ACETONIDE 40 MG/ML IJ SUSP
40.0000 mg | INTRAMUSCULAR | Status: AC | PRN
  Administered 2021-09-26: 40 mg via INTRA_ARTICULAR

## 2021-10-31 MED ORDER — LIDOCAINE HCL 1 % IJ SOLN
3.0000 mL | INTRAMUSCULAR | Status: AC | PRN
  Administered 2021-09-26: 3 mL

## 2021-11-17 ENCOUNTER — Other Ambulatory Visit (HOSPITAL_COMMUNITY): Payer: Self-pay | Admitting: Family Medicine

## 2021-11-24 ENCOUNTER — Encounter (HOSPITAL_COMMUNITY): Payer: Self-pay | Admitting: Internal Medicine

## 2021-11-24 ENCOUNTER — Ambulatory Visit (HOSPITAL_COMMUNITY)
Admission: RE | Admit: 2021-11-24 | Discharge: 2021-11-24 | Disposition: A | Discharge status: Home / Self Care | Payer: Medicare Other | Source: Ambulatory Visit | Attending: Internal Medicine | Admitting: Internal Medicine

## 2021-11-24 VITALS — BP 90/50 | HR 67 | Wt 115.4 lb

## 2021-11-24 DIAGNOSIS — N186 End stage renal disease: Secondary | ICD-10-CM | POA: Insufficient documentation

## 2021-11-24 DIAGNOSIS — Z720 Tobacco use: Secondary | ICD-10-CM | POA: Insufficient documentation

## 2021-11-24 DIAGNOSIS — Z8673 Personal history of transient ischemic attack (TIA), and cerebral infarction without residual deficits: Secondary | ICD-10-CM | POA: Insufficient documentation

## 2021-11-24 DIAGNOSIS — I4892 Unspecified atrial flutter: Secondary | ICD-10-CM | POA: Insufficient documentation

## 2021-11-24 DIAGNOSIS — I493 Ventricular premature depolarization: Secondary | ICD-10-CM | POA: Diagnosis not present

## 2021-11-24 DIAGNOSIS — M109 Gout, unspecified: Secondary | ICD-10-CM | POA: Diagnosis not present

## 2021-11-24 DIAGNOSIS — K3184 Gastroparesis: Secondary | ICD-10-CM | POA: Insufficient documentation

## 2021-11-24 DIAGNOSIS — I5032 Chronic diastolic (congestive) heart failure: Secondary | ICD-10-CM | POA: Insufficient documentation

## 2021-11-24 DIAGNOSIS — I480 Paroxysmal atrial fibrillation: Secondary | ICD-10-CM | POA: Diagnosis not present

## 2021-11-24 DIAGNOSIS — I959 Hypotension, unspecified: Secondary | ICD-10-CM | POA: Diagnosis not present

## 2021-11-24 DIAGNOSIS — Z79899 Other long term (current) drug therapy: Secondary | ICD-10-CM | POA: Insufficient documentation

## 2021-11-24 DIAGNOSIS — Z940 Kidney transplant status: Secondary | ICD-10-CM | POA: Diagnosis not present

## 2021-11-24 DIAGNOSIS — I132 Hypertensive heart and chronic kidney disease with heart failure and with stage 5 chronic kidney disease, or end stage renal disease: Secondary | ICD-10-CM | POA: Insufficient documentation

## 2021-11-24 DIAGNOSIS — R0789 Other chest pain: Secondary | ICD-10-CM | POA: Diagnosis not present

## 2021-11-24 NOTE — Progress Notes (Signed)
ReDS Vest / Clip - 11/24/21 1000       ReDS Vest / Clip   Station Marker A    Ruler Value 26    ReDS Value Range Low volume    ReDS Actual Value 34

## 2021-11-24 NOTE — Progress Notes (Signed)
ADVANCED HF CLINIC NOTE  Primary Cardiologist: Dr. Audie Box  Nephrologist: Dr. Royce Macadamia HF Cardiologist: Dr. Haroldine Laws   HPI: Tina Watkins is a 56 y.o. female with a hx of gout, gastroparesis, CVA, ESRD status post renal transplant, heart failure with preserved ejection fraction, hypertension.  In 12/21, gained a lot of fluid (~30 pounds) over the holidays and was referred to Dr. Audie Box. He recommended IV diuresis but she did not want to be admitted. Saw Dr. Marval Regal who switched to torsemide   Admitted 2/21 for volume overload. Did very well with IV diuresis. Weigh down to 119 pounds. She did bump creatinine to 3.6 so diuretics held for a short time. RHC attempted but could not complete due to access challenges and inability to get into the PA. RA pressure was 3 and CO was normal. There was evidence of multiple venous narrowings in the IVC.   Follow up in AHF clinic 11/21 for pre-op surgical risk stratification for PD catheter. Started back on HD over the summer. BP low at HD frequently and taking midodrine.   Admitted 2//22 for atrial fibrillation with RVR. Sent from HD, resolved with IV diltiazem, but complicated by subsequent hypotension. Repeat echo EF 65-70%, RV ok.  Zio placed in 3/22 for palpitations and NST ordered. Rare PACs and PVCs, < 1% AFL. NST (3/22) no evidence of ischemia, EF > 65%, low risk study.  Returns for HF f/u. Says for past 2 months has been having chest pressure, orthopnea or PND.  Says chest pressure only occurs when lying down and not with activity. Going to HD 3x/week. Dry weight 50.3 kg and they are hitting that. Smoking a lot due to stress.   ROS: All systems reviewed and negative except as per HPI.   Cardiac studies: Zio (2/22): mostly NSR, < 1% AFL, rare PACs/PVCs  Echo (2/22): EF 65-70%, RV ok  Zio (1/22): Sinus rhythm - avg HR of 78 bpm, Attrial Flutter occurred (<1% burden), ranging from 113-208 bpm (avg of 148 bpm), the longest lasting  1 hour 16 mins with an avg rate of 139 bpm, rare PACs and PVCs.    [place] 2/21: RHC attempted but could not complete due to  Access challenges and inability to get into the PA. RA pressure was 3 and cardiac output was normal. There was evidence of multiple venous narrowings in the IVC.   Echo 12/20: EF 60-65%, mod increased LVH, Grade III DD, mod elevated PA pressure, RVSP 44.6 mmHg, moderate TR  Echo 8/17: EF 60-65%, PA peak pressure 48 mmHg,  Past Medical History:  Diagnosis Date   Bacteremia due to Gram-negative bacteria 05/23/2011   Blind    right eye   Blind right eye    CHF (congestive heart failure) (HCC)    Chronic lower back pain    Complication of anesthesia    "woke up during OR; I have an extremely high tolerance" (12/11/2011) 1 procedure was graft; the other procedures were procedures that are typically done with sedation.   DDD (degenerative disc disease), cervical    Depression    Dysrhythmia    "tachycardia" (12/11/2011) new onset afib 10/15/14 EKG   E coli bacteremia 06/18/2011   Elevated LDL cholesterol level 12/2018   ESRD (end stage renal disease) ([place]) 06/12/2011   Fibromyalgia    Gastroparesis    Gastroparesis    GERD (gastroesophageal reflux disease)    Glaucoma    right eye   Gout    H/O carpal tunnel syndrome  Headache 10/2019   Headache(784.0)    "not often anymore" (12/11/2011)   Herpes genitalia 1994   History of blood transfusion    "more than a few times" (12/11/2011)   History of stomach ulcers    Hypotension    Iron deficiency anemia    New onset a-fib ([place])    10/15/14 EKG   Osteopenia    Peripheral neuropathy 11/2018   Pressure ulcer of sacral region, stage 1 07/2019   Seizures ([place]) 1994   "post transplant; only have had that one" (12/11/2011)   Spinal stenosis in cervical region    Stroke [facility])     left basal ganglia lacunar infarct; Right frontal lobe lacunar infarct.   Stroke [facility]) ~ 1999; 2001   "briefly lost my vision; lost my right  eye" (12/11/2011)   Vitamin D deficiency 12/2018    Current Outpatient Medications  Medication Sig Dispense Refill   allopurinol (ZYLOPRIM) 100 MG tablet TAKE 1 TABLET BY MOUTH EVERY DAY 90 tablet 3   amiodarone (PACERONE) 200 MG tablet Take 100 mg by mouth daily.     B Complex-C-Zn-Folic Acid (DIALYVITE 151-VOHY 15) 0.8 MG TABS Take 0.8 mg by mouth daily.     busPIRone (BUSPAR) 10 MG tablet Take 5 mg by mouth 2 (two) times daily.     Colchicine 0.6 MG CAPS Take 0.6 mg by mouth daily as needed (gout flare up).     diclofenac Sodium (VOLTAREN) 1 % GEL Apply 4 g topically as needed.     diphenhydrAMINE (BENADRYL) 25 mg capsule Take 1 capsule (25 mg total) by mouth every 8 (eight) hours as needed. 30 capsule 6   ELIQUIS 2.5 MG TABS tablet TAKE 1 TABLET BY MOUTH TWICE A DAY 60 tablet 3   escitalopram (LEXAPRO) 20 MG tablet Take 20 mg by mouth daily.     famotidine (PEPCID) 20 MG tablet Take 20 mg by mouth 2 (two) times daily.     fluticasone (FLONASE) 50 MCG/ACT nasal spray Place 1 spray into both nostrils daily as needed for allergies or rhinitis.     fluticasone-salmeterol (ADVAIR HFA) 115-21 MCG/ACT inhaler Inhale 2 puffs into the lungs 2 (two) times daily. 1 each 12   gabapentin (NEURONTIN) 100 MG capsule TAKE 1 CAPSULE BY MOUTH TWICE A DAY MAX 2 CAPSULES DAILY 60 capsule 1   latanoprost (XALATAN) 0.005 % ophthalmic solution Place 1 drop into the left eye at bedtime.  3   lidocaine (LIDODERM) 5 % Place 1 patch onto the skin daily as needed (for pain- remove old patch first).      Methoxy PEG-Epoetin Beta (MIRCERA IJ) Inject 1 Dose as directed See admin instructions. Unknown to pt     metoCLOPramide (REGLAN) 5 MG tablet TAKE 1 TABLET (5 MG TOTAL) BY MOUTH 2 (TWO) TIMES DAILY BEFORE A MEAL. 180 tablet 3   midodrine (PROAMATINE) 10 MG tablet Take 1.5 tablets (15 mg total) by mouth 3 (three) times daily. 405 tablet 3   omeprazole (PRILOSEC) 40 MG capsule TAKE 1 CAPSULE (40 MG TOTAL) BY MOUTH  DAILY. 90 capsule 1   ondansetron (ZOFRAN-ODT) 4 MG disintegrating tablet Take 1 tablet (4 mg total) by mouth every 8 (eight) hours as needed for nausea or vomiting. 20 tablet 11   oxyCODONE-acetaminophen (PERCOCET) 10-325 MG tablet Take 1 tablet by mouth 4 (four) times daily as needed for pain.     Vitamin D, Ergocalciferol, (DRISDOL) 1.25 MG (50000 UNIT) CAPS capsule TAKE 1 CAPSULE (50,000 UNITS TOTAL)  BY MOUTH EVERY 7 (SEVEN) DAYS 5 capsule 6   zolpidem (AMBIEN) 10 MG tablet Take 1 tablet (10 mg total) by mouth at bedtime as needed for sleep. 10 tablet 0   No current facility-administered medications for this encounter.    Allergies  Allergen Reactions   Levofloxacin Itching and Rash   Tape Other (See Comments)    "Certain surgical tapes peel off my skin"     Social History   Socioeconomic History   Marital status: Single    Spouse name: Not on file   Number of children: 0   Years of education: some college   Highest education level: Some college, no degree  Occupational History   Occupation: disabled    Fish farm manager: UNEMPLOYED  Tobacco Use   Smoking status: Every Day    Packs/day: 1.50    Years: 35.00    Total pack years: 52.50    Types: Cigarettes    Passive exposure: Never   Smokeless tobacco: Never   Tobacco comments:    Pack of cigarettes lasts 2 days 09/18/2020  Vaping Use   Vaping Use: Never used  Substance and Sexual Activity   Alcohol use: Not Currently    Comment: rarely   Drug use: Yes    Frequency: 1.0 times per week    Types: Marijuana    Comment: occ   Sexual activity: Not Currently    Partners: Male    Birth control/protection: None  Other Topics Concern   Not on file  Social History Narrative   Right-handed.   Four cups caffeine per day.   Lives alone.   Social Determinants of Health   Financial Resource Strain: Low Risk  (04/24/2021)   Overall Financial Resource Strain (CARDIA)    Difficulty of Paying Living Expenses: Not hard at all  Food  Insecurity: No Food Insecurity (04/24/2021)   Hunger Vital Sign    Worried About Running Out of Food in the Last Year: Never true    Ran Out of Food in the Last Year: Never true  Transportation Needs: No Transportation Needs (04/24/2021)   PRAPARE - Hydrologist (Medical): No    Lack of Transportation (Non-Medical): No  Physical Activity: Insufficiently Active (04/24/2021)   Exercise Vital Sign    Days of Exercise per Week: 4 days    Minutes of Exercise per Session: 20 min  Stress: Stress Concern Present (04/24/2021)   [place]    Feeling of Stress : Rather much  Social Connections: Moderately Isolated (04/24/2021)   Social Connection and Isolation Panel [NHANES]    Frequency of Communication with Friends and Family: Three times a week    Frequency of Social Gatherings with Friends and Family: Once a week    Attends Religious Services: 1 to 4 times per year    Active Member of [employer] or Organizations: No    Attends Archivist Meetings: Never    Marital Status: Never married  Intimate Partner Violence: Not At Risk (04/24/2021)   Humiliation, Afraid, Rape, and Kick questionnaire    Fear of Current or Ex-Partner: No    Emotionally Abused: No    Physically Abused: No    Sexually Abused: No   Family History  Problem Relation Age of Onset   Glaucoma Mother    Pancreatic cancer Father    Multiple sclerosis Brother    Diabetes Maternal Uncle    Hypertension Maternal Grandmother  Breast cancer Neg Hx    BP (!) 90/50   Pulse 67   Wt 52.3 kg (115 lb 6.4 oz)   SpO2 100%   BMI 23.31 kg/m   Wt Readings from Last 3 Encounters:  11/24/21 52.3 kg (115 lb 6.4 oz)  10/01/21 50.8 kg (112 lb)  09/26/21 50.2 kg (110 lb 9.6 oz)   PHYSICAL EXAM: General:  NAD. No resp difficulty, arrived in [facility], chronically-ill appearing. HEENT: R eye enucleation Neck: supple. no JVD. Carotids 2+ bilat;  no bruits. No lymphadenopathy or thryomegaly appreciated. Cor: PMI nondisplaced. Regular rate & rhythm. No rubs, gallops or murmurs. Lungs: clear Abdomen: soft, nontender, nondistended. No hepatosplenomegaly. No bruits or masses. Good bowel sounds. Extremities: no cyanosis, clubbing, rash, 2+ firm edema into thighs R groin HD cath Neuro: alert & orientedx3, cranial nerves grossly intact. moves all 4 extremities w/o difficulty. Affect pleasant  ECG (personally reviewed): NSR 61 bpm No ST-T wave abnormalities. Personally reviewed   ASSESSMENT & PLAN:  1. Chest pressure and orthopnea - this seems more related to volume to me but ReDS lung water only 34% (normal 20-35%) - has multiple CRFs -> will check Lexiscan Myoview - would push dry weight as tolerated given LE edema    2. 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 Grade 3 DD - Echo (2/22): EF 65-70%, RV normal. - NYHA II-early III, functional status difficult due to ESRD and deconditioning.  - Volume per HD. Mildly elevated today on exam but ReDS lung water not too bad at 24%. Due for HD tomorrow  - Continue midodrine.  3. Atrial fibrillation, paroxysmal - Zio patch (2/22) with <1% atrial flutter burden, some PVC/PACs, mostly NSR. - Lexiscan (3/22) showed no evidence of ischemia, EF > 65%, low risk study. - ChadsVasc Score 6.  - Continue Eliquis 2.5 mg bid (weight < 60 kg & ESRD). No bleeding issues. - Continue amiodarone 200 mg daily. - Followed by AF clinic. - Remains in NSR today  4. Hypotension - Continue midodrine 15 mg tid.  5. Tobacco use - Continues to smoke  - Encouraged her to quit   6. ESRD - s/p previous failed renal transplant. - She is T/TH/Sat HD.   Glori Bickers, MD  11/24/21 10:16 AM

## 2021-11-24 NOTE — Patient Instructions (Signed)
Labs done today, your results will be available in MyChart, we will contact you for abnormal readings.  Your physician has requested that you have a lexiscan myoview. For further information please visit [email or web address]. Please follow instruction sheet, as given.   Your physician recommends that you schedule a follow-up appointment in: 4-6 months, **PLEASE CALL OUR OFFICE IN [place] TO SCHEDULE THIS APPOINTMENT  If you have any questions or concerns before your next appointment please send [name] a message through [place] or call our office at [phone].    TO LEAVE A MESSAGE FOR THE NURSE SELECT OPTION 2, PLEASE LEAVE A MESSAGE INCLUDING: YOUR NAME DATE OF BIRTH CALL BACK NUMBER REASON FOR CALL**this is important as we prioritize the call backs  YOU WILL RECEIVE A CALL BACK THE SAME DAY AS LONG AS YOU CALL BEFORE 4:00 PM  At the [facility], you and your health needs are our priority. As part of our continuing mission to provide you with exceptional heart care, we have created designated Provider Care Teams. These Care Teams include your primary Cardiologist (physician) and Advanced Practice Providers (APPs- Physician Assistants and Nurse Practitioners) who all work together to provide you with the care you need, when you need it.   You may see any of the following providers on your designated Care Team at your next follow up: Dr Glori Bickers Dr Loralie Champagne Dr. Roxana Hires, NP Lyda Jester, [place] [facility] [place], [place] Forestine Na, NP Audry Riles, PharmD   Please be sure to bring in all your medications bottles to every appointment.

## 2021-12-10 ENCOUNTER — Telehealth (HOSPITAL_COMMUNITY): Payer: Self-pay | Admitting: *Deleted

## 2021-12-10 ENCOUNTER — Ambulatory Visit: Payer: Medicare Other | Attending: Internal Medicine | Admitting: Physical Therapy

## 2021-12-10 ENCOUNTER — Encounter: Payer: Self-pay | Admitting: Physical Therapy

## 2021-12-10 ENCOUNTER — Other Ambulatory Visit: Payer: Self-pay

## 2021-12-10 DIAGNOSIS — M25612 Stiffness of left shoulder, not elsewhere classified: Secondary | ICD-10-CM | POA: Insufficient documentation

## 2021-12-10 DIAGNOSIS — G8929 Other chronic pain: Secondary | ICD-10-CM | POA: Diagnosis present

## 2021-12-10 DIAGNOSIS — M25611 Stiffness of right shoulder, not elsewhere classified: Secondary | ICD-10-CM | POA: Insufficient documentation

## 2021-12-10 DIAGNOSIS — M6281 Muscle weakness (generalized): Secondary | ICD-10-CM | POA: Diagnosis present

## 2021-12-10 DIAGNOSIS — M25512 Pain in left shoulder: Secondary | ICD-10-CM | POA: Diagnosis not present

## 2021-12-10 DIAGNOSIS — M25511 Pain in right shoulder: Secondary | ICD-10-CM | POA: Insufficient documentation

## 2021-12-10 NOTE — Therapy (Signed)
OUTPATIENT PHYSICAL THERAPY SHOULDER EVALUATION   Patient Name: Tina Watkins MRN: [id number] DOB:September 11, 1965, 56 y.o., female Today's Date: 12/10/2021   PT End of Session - 12/10/21 1015     Visit Number 1    Number of Visits --   1-2x/week   Date for PT Re-Evaluation 02/04/22    Authorization Type MEDICARE PART A AND B; MEDICAID [place] ACCESS    PT Start Time 0917    PT Stop Time 0956    PT Time Calculation (min) 39 min             Past Medical History:  Diagnosis Date   Bacteremia due to Gram-negative bacteria 05/23/2011   Blind    right eye   Blind right eye    CHF (congestive heart failure) (HCC)    Chronic lower back pain    Complication of anesthesia    "woke up during OR; I have an extremely high tolerance" (12/11/2011) 1 procedure was graft; the other procedures were procedures that are typically done with sedation.   DDD (degenerative disc disease), cervical    Depression    Dysrhythmia    "tachycardia" (12/11/2011) new onset afib 10/15/14 EKG   E coli bacteremia 06/18/2011   Elevated LDL cholesterol level 12/2018   ESRD (end stage renal disease) ([place]) 06/12/2011   Fibromyalgia    Gastroparesis    Gastroparesis    GERD (gastroesophageal reflux disease)    Glaucoma    right eye   Gout    H/O carpal tunnel syndrome    Headache 10/2019   Headache(784.0)    "not often anymore" (12/11/2011)   Herpes genitalia 1994   History of blood transfusion    "more than a few times" (12/11/2011)   History of stomach ulcers    Hypotension    Iron deficiency anemia    New onset a-fib ([place])    10/15/14 EKG   Osteopenia    Peripheral neuropathy 11/2018   Pressure ulcer of sacral region, stage 1 07/2019   Seizures ([place]) 1994   "post transplant; only have had that one" (12/11/2011)   Spinal stenosis in cervical region    Stroke [facility] [place])     left basal ganglia lacunar infarct; Right frontal lobe lacunar infarct.   Stroke [facility]) ~ 1999; 2001   "briefly lost my vision;  lost my right eye" (12/11/2011)   Vitamin D deficiency 12/2018   Past Surgical History:  Procedure Laterality Date   ANTERIOR CERVICAL DECOMP/DISCECTOMY FUSION N/A 01/08/2015   Procedure: Anterior Cervical Three-Four/Four-Five Decompression/Diskectomy/Fusion;  Surgeon: Leeroy Cha, MD;  Location: [place] [facility];  Service: Neurosurgery;  Laterality: N/A;  C3-4 C4-5 Anterior cervical decompression/diskectomy/fusion   APPENDECTOMY  ~ 2004   BIOPSY  09/12/2020   Procedure: BIOPSY;  Surgeon: Milus Banister, MD;  Location: [facility];  Service: Endoscopy;;   CATARACT EXTRACTION     right eye   COLONOSCOPY     COLONOSCOPY WITH PROPOFOL N/A 09/12/2020   Procedure: COLONOSCOPY WITH PROPOFOL;  Surgeon: Milus Banister, MD;  Location: [facility];  Service: Endoscopy;  Laterality: N/A;   ENUCLEATION  2001   "right"   ESOPHAGOGASTRODUODENOSCOPY (EGD) WITH PROPOFOL N/A 04/21/2012   Procedure: ESOPHAGOGASTRODUODENOSCOPY (EGD) WITH PROPOFOL;  Surgeon: Milus Banister, MD;  Location: [facility];  Service: Endoscopy;  Laterality: N/A;   ESOPHAGOGASTRODUODENOSCOPY (EGD) WITH PROPOFOL N/A 09/12/2020   Procedure: ESOPHAGOGASTRODUODENOSCOPY (EGD) WITH PROPOFOL;  Surgeon: Milus Banister, MD;  Location: [facility];  Service: Endoscopy;  Laterality: N/A;  INSERTION OF DIALYSIS CATHETER  1988   "AV graft LUA & LFA; LUA worked for 1 day; LFA never worked"   IR FLUORO GUIDE CV LINE RIGHT  07/11/2019   IR FLUORO GUIDE CV LINE RIGHT  10/20/2019   IR FLUORO GUIDE CV LINE RIGHT  05/31/2020   IR PTA VENOUS EXCEPT DIALYSIS CIRCUIT  05/31/2020   IR RADIOLOGY PERIPHERAL GUIDED IV START  07/11/2019   IR US GUIDE VASC ACCESS RIGHT  07/11/2019   IR US GUIDE VASC ACCESS RIGHT  07/11/2019   IR US GUIDE VASC ACCESS RIGHT  07/11/2019   IR VENOCAVAGRAM IVC  05/31/2020   KIDNEY TRANSPLANT  1994; 1999; 2005   "right"   MULTIPLE TOOTH EXTRACTIONS     POLYPECTOMY  09/12/2020   Procedure: POLYPECTOMY;  Surgeon: Milus Banister, MD;   Location: [facility];  Service: Endoscopy;;   RIGHT HEART CATH N/A 03/23/2019   Procedure: RIGHT HEART CATH;  Surgeon: Jolaine Artist, MD;  Location: [place] CV LAB;  Service: Cardiovascular;  Laterality: N/A;   [place]?; 1994; 2005   Patient Active Problem List   Diagnosis Date Noted   Prolonged Q-T interval on ECG 09/07/2021   Nausea & vomiting 09/06/2021   Bilateral shoulder pain 06/06/2021   Bilateral hand swelling 06/06/2021   Cervical radiculitis 01/24/2021   Acute respiratory failure with hypoxia ([place]) 10/23/2020   Coagulation defect, unspecified ([place]) 08/01/2020   Aortic atherosclerosis ([place]) 07/10/2020   Secondary hypercoagulable state ([place]) 04/23/2020   Atrial flutter ([place]) 03/29/2020   Chronic pain of both knees 03/13/2020   Chills (without fever) 12/12/2019   Other pancytopenia ([place])    Decubitus ulcer of sacral region, stage 1 08/18/2019   HCAP (healthcare-associated pneumonia) 08/05/2019   Pressure injury of skin 07/24/2019   Chest congestion 07/24/2019   Itching 07/24/2019   Weakness 06/13/2019   Pneumonia due to COVID-19 virus 05/09/2019   Hypotension 05/09/2019   Symptomatic anemia 05/09/2019   CHF (congestive heart failure) ([place]) 05/09/2019   Swollen abdomen 01/04/2019   DDD (degenerative disc disease), cervical 12/06/2018   Neuropathy 12/06/2018   Paresthesia 10/11/2018   Neck pain 10/11/2018   Tobacco abuse 11/20/2017   Right leg swelling 11/20/2017   Right leg pain 11/20/2017   Stroke (cerebrum) ([place]) 11/20/2017   GERD (gastroesophageal reflux disease) 11/20/2017   Acute venous embolism and thrombosis of deep vessels of proximal lower extremity, right ([place]) 11/20/2017   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    Thrush, oral    AKI (acute kidney injury) ([place])    Multifocal pneumonia 08/09/2017   ETD (Eustachian tube dysfunction), bilateral 08/03/2017   Acute recurrent  pansinusitis 07/21/2017   Chest pain 09/29/2015   Cervical stenosis of spinal canal 01/08/2015   Urinary tract infectious disease    Muscle spasms of neck 06/15/2014   Bleeding hemorrhoid 06/15/2014   Anemia in chronic kidney disease 06/15/2014   Pyelonephritis, acute 06/13/2014   Sepsis ([place]) 06/13/2014   History of kidney transplant    Abnormal CT scan 09/14/2012   Chronic pain syndrome 04/09/2012   Dehydration, mild 04/09/2012   Herpes infection 06/23/2011   Anxiety 06/23/2011   E coli bacteremia 06/18/2011   ESRD (end stage renal disease) ([place]) 06/12/2011   UTI (lower urinary tract infection) 06/12/2011   Bacteremia due to Gram-negative bacteria 05/23/2011   History of renal transplantation 05/22/2011   Septic shock(785.52) 05/22/2011  Acute on chronic kidney failure ([place]) 05/22/2011   Gastroparesis 04/24/2008   WEIGHT LOSS 08/24/2007   NAUSEA AND VOMITING 08/24/2007    PCP: Teena Dunk, NP  REFERRING PROVIDER: Cleophas Dunker, *  THERAPY DIAG:  Chronic left shoulder pain - Plan: PT plan of care cert/re-cert  Chronic right shoulder pain - Plan: PT plan of care cert/re-cert  Muscle weakness (generalized) - Plan: PT plan of care cert/re-cert  REFERRING DIAG: Bil shoulder adhesive capuslitis  Rationale for Evaluation and Treatment:  Rehabilitation  SUBJECTIVE:  PERTINENT PAST HISTORY:  Hx of stroke, DVT, CHF, Afib, Neuropathy, neck pain, gout, DDD, ESRD, cervical stenosis, dialysis (T, Th, sat), kidney transplant (failed)         PRECAUTIONS: see pertinent history  WEIGHT BEARING RESTRICTIONS No  FALLS:  Has patient fallen in last 6 months? No, Number of falls: 0  MOI/History of condition:  Onset date: chronic >3 years  SUBJECTIVE STATEMENT  Tina Watkins is a 56 y.o. female who presents to clinic with chief complaint of history of chronic bil shoulder pain.  Chronically limited in shoulder ROM as well.  She reports she had spinal surgery  in 2017 and had tightness which eventually spread to both shoulders.  She has a history of steroid injections.  The most recent was guided about 2 weeks ago.  She sees some improvement since the most recent injection.  Denies SOB in reclined positions.     Red flags:  denies bil numbness and tingling  Pain:  Are you having pain? No Pain location: diffuse shoulder pain R>L NPRS scale:  current 0/10  average 1/10  Aggravating factors: reaching, lifting, activity  NPRS, highest: 3/10 Relieving factors: rest  NPRS: best: 0/10 Pain description: constant and aching Stage: Chronic Stability: staying the same 24 hour pattern: worse with activity   Occupation: NA  Assistive Device: NA  Hand Dominance: R  Patient Goals/Specific Activities: put hair in ponytail    OBJECTIVE:   GENERAL OBSERVATION:  Slight forward shoulder     SENSATION:  Light touch: Appears intact   PALPATION: Diffuse bil shoulder pain  UPPER EXTREMITY AROM:  AROM/PROM Right 12/10/2021 Left 12/10/2021  Shoulder flexion 90/100 80/90  Shoulder abduction 70 70  Shoulder internal rotation    Shoulder external rotation    Functional IR PSIS PSIS  Functional ER ear ear  Shoulder extension    Elbow extension    Elbow flexion     (Blank rows = not tested, N = WNL, * = concordant pain with testing)  UPPER EXTREMITY MMT:  MMT Right 12/10/2021 Left 12/10/2021  Shoulder flexion 6.2 7.5  Shoulder abduction (C5) Unable/0 Unable/0  Shoulder ER 10.1 7.3  Shoulder IR 12.6 14.3  Middle trapezius    Lower trapezius    Shoulder extension    Grip strength 11 kg 10 kg  Cervical flexion (C1,C2)    Cervical S/B (C3)    Shoulder shrug (C4)    Elbow flexion (C6)    Elbow ext (C7)    Thumb ext (C8)    Finger abd (T1)    Grossly     (Blank rows = not tested, score listed is in lbs unless noted.  N = WNL, D = diminished, C = clear for gross weakness with myotome testing, * = concordant pain with  testing)   MUSCLE LENGTH:    Pec Major: L (+), R (+) for restriction    PATIENT SURVEYS:  Quick Dash 29 pts  TODAY'S TREATMENT:  Creating, reviewing, and completing below HEP   PATIENT EDUCATION:  POC, diagnosis, prognosis, HEP, and outcome measures.  Pt educated via explanation, demonstration, and handout (HEP).  Pt confirms understanding verbally.   HOME EXERCISE PROGRAM: Access Code: [email or web address] URL: [email or web address] Date: 12/10/2021 Prepared by: Shearon Balo  Exercises - Seated Shoulder Row with Anchored Resistance  - 1 x daily - 7 x weekly - 3 sets - 10 reps - Seated Shoulder Extension and Scapular Retraction with Resistance  - 1 x daily - 7 x weekly - 3 sets - 10 reps - Standing Shoulder Posterior Capsule Stretch  - 2 x daily - 4 x weekly - 3 sets - 1 reps - 45 hold  ASTERISK SIGNS   Asterisk Signs Eval (12/10/2021)       Functional ER To ear bil       Flexion  R 6.2 lbs, L 7.5 lbs       Comb hair unable                         ASSESSMENT:  CLINICAL IMPRESSION: Tina Watkins is a 56 y.o. female who presents to clinic with signs and sxs consistent with chronic bil shoulder pain with ROM limitations consistent with physical impression of adhesive capsulitis.  The recent steroid injections have been significantly helpful for her pain which prior to the injections was "88/10", but her ROM remains quite limited.  OBJECTIVE IMPAIRMENTS: Pain, shoulder ROM, shoulder strength  ACTIVITY LIMITATIONS: housework, lifting, reaching  PERSONAL FACTORS: See medical history and pertinent history   REHAB POTENTIAL: Fair chronic, recurring, complex medical history  CLINICAL DECISION MAKING: Stable/uncomplicated  EVALUATION COMPLEXITY: Low   GOALS:   SHORT TERM GOALS: Target date: 01/07/2022  Tina Watkins will be >75% HEP compliant to improve carryover between sessions and facilitate independent management of condition  Evaluation (12/10/2021):  ongoing Goal status: INITIAL   LONG TERM GOALS: Target date: 02/04/2022  Tina Watkins will show a >/= 15 pt improvement in her QUICK DASH score (MCID is 13.4 pts or 8%) as a proxy for functional improvement   Evaluation/Baseline (12/10/2021): 29 pts Goal status: INITIAL   2.  Tina Watkins will be able to comb her hair, not limited by pain or ROM  Evaluation/Baseline (12/10/2021): limited Goal status: INITIAL   3.  Tina Watkins will be able to reach to low shelf, not limited by pain or ROM  Evaluation/Baseline (12/10/2021): limited Goal status: INITIAL   4.  Tina Watkins will demonstrate >110 degrees of active ROM in flexion to allow completion of activities involving reaching [place], not limited by pain  Evaluation/Baseline (12/10/2021):   UPPER EXTREMITY AROM:  AROM/PROM Right 12/10/2021 Left 12/10/2021  Shoulder flexion 90/100 80/90  Shoulder abduction 70 70  Shoulder internal rotation    Shoulder external rotation    Functional IR PSIS PSIS  Functional ER ear ear  Shoulder extension    Elbow extension    Elbow flexion     (Blank rows = not tested, N = WNL, * = concordant pain with testing)  Goal status: INITIAL    PLAN: PT FREQUENCY: 1-2x/week  PT DURATION: 8 weeks (Ending 02/04/2022)  PLANNED INTERVENTIONS: Therapeutic exercises, Aquatic therapy, Therapeutic activity, Neuro Muscular re-education, Gait training, Patient/Family education, Joint mobilization, Dry Needling, Electrical stimulation, Spinal mobilization and/or manipulation, Moist heat, Taping, Vasopneumatic device, Ionotophoresis '4mg'$ /ml Dexamethasone, and Manual therapy  PLAN FOR NEXT SESSION: progressive shoulder strengthening, MT for bil shoulder capsules, ROM progression   Shearon Balo PT,  DPT 12/10/2021, 10:19 AM

## 2021-12-10 NOTE — Telephone Encounter (Signed)
Patient given detailed instructions per Myocardial Perfusion Study Information Sheet for the test on 12/15/2021 at 10:00. Patient notified to arrive 15 minutes early and that it is imperative to arrive on time for appointment to keep from having the test rescheduled.  If you need to cancel or reschedule your appointment, please call the office within 24 hours of your appointment. . Patient verbalized understanding.Tina Watkins

## 2021-12-15 ENCOUNTER — Ambulatory Visit (HOSPITAL_COMMUNITY): Payer: Medicare Other | Attending: Internal Medicine

## 2021-12-15 DIAGNOSIS — R0789 Other chest pain: Secondary | ICD-10-CM

## 2021-12-15 LAB — MYOCARDIAL PERFUSION IMAGING
Base ST Depression (mm): 0 mm
LV dias vol: 54 mL (ref 46–106)
LV sys vol: 13 mL
Nuc Stress EF: 75 %
Peak HR: 89 {beats}/min
Rest HR: 71 {beats}/min
Rest Nuclear Isotope Dose: 10.1 mCi
SDS: 2
SRS: 5
SSS: 7
ST Depression (mm): 0 mm
Stress Nuclear Isotope Dose: 30.3 mCi
TID: 1.02

## 2021-12-15 MED ORDER — AMINOPHYLLINE 25 MG/ML IV SOLN
75.0000 mg | Freq: Once | INTRAVENOUS | Status: AC
  Administered 2021-12-15: 75 mg via INTRAVENOUS

## 2021-12-15 MED ORDER — REGADENOSON 0.4 MG/5ML IV SOLN
0.4000 mg | Freq: Once | INTRAVENOUS | Status: AC
  Administered 2021-12-15: 0.4 mg via INTRAVENOUS

## 2021-12-15 MED ORDER — TECHNETIUM TC 99M TETROFOSMIN IV KIT
30.3000 | PACK | Freq: Once | INTRAVENOUS | Status: AC | PRN
  Administered 2021-12-15: 30.3 via INTRAVENOUS

## 2021-12-15 MED ORDER — TECHNETIUM TC 99M TETROFOSMIN IV KIT
10.1000 | PACK | Freq: Once | INTRAVENOUS | Status: AC | PRN
  Administered 2021-12-15: 10.1 via INTRAVENOUS

## 2021-12-17 ENCOUNTER — Telehealth: Payer: Self-pay | Admitting: Physical Therapy

## 2021-12-17 ENCOUNTER — Ambulatory Visit: Payer: Medicare Other | Admitting: Physical Therapy

## 2021-12-17 ENCOUNTER — Encounter: Payer: Medicare Other | Admitting: Physical Therapy

## 2021-12-17 NOTE — Telephone Encounter (Signed)
Called and informed patient of missed visit and provided reminder of next appt and attendance policy.  

## 2021-12-19 ENCOUNTER — Encounter: Payer: Self-pay | Admitting: Physical Therapy

## 2021-12-19 ENCOUNTER — Ambulatory Visit: Payer: Medicare Other | Admitting: Physical Therapy

## 2021-12-19 DIAGNOSIS — M6281 Muscle weakness (generalized): Secondary | ICD-10-CM

## 2021-12-19 DIAGNOSIS — G8929 Other chronic pain: Secondary | ICD-10-CM

## 2021-12-19 DIAGNOSIS — M25512 Pain in left shoulder: Secondary | ICD-10-CM | POA: Diagnosis not present

## 2021-12-19 NOTE — Therapy (Signed)
OUTPATIENT PHYSICAL THERAPY TREATMENT NOTE   Patient Name: Tina Watkins MRN: [id number] DOB:[birth year], 56 y.o., female Today's Date: 12/19/2021  PCP: Bo Merino I, NP   REFERRING PROVIDER: Cleophas Dunker, *   PT End of Session - 12/19/21 [phone]     Visit Number 2    Number of Visits --   1-2x/week   Date for PT Re-Evaluation 02/04/22    Authorization Type MEDICARE PART A AND B; MEDICAID [place] ACCESS    PT Start Time 0702    PT Stop Time 0742    PT Time Calculation (min) 40 min             Past Medical History:  Diagnosis Date   Bacteremia due to Gram-negative bacteria 05/23/2011   Blind    right eye   Blind right eye    CHF (congestive heart failure) (HCC)    Chronic lower back pain    Complication of anesthesia    "woke up during OR; I have an extremely high tolerance" (12/11/2011) 1 procedure was graft; the other procedures were procedures that are typically done with sedation.   DDD (degenerative disc disease), cervical    Depression    Dysrhythmia    "tachycardia" (12/11/2011) new onset afib 10/15/14 EKG   E coli bacteremia 06/18/2011   Elevated LDL cholesterol level 12/2018   ESRD (end stage renal disease) ([place]) 06/12/2011   Fibromyalgia    Gastroparesis    Gastroparesis    GERD (gastroesophageal reflux disease)    Glaucoma    right eye   Gout    H/O carpal tunnel syndrome    Headache 10/2019   Headache(784.0)    "not often anymore" (12/11/2011)   Herpes genitalia 1994   History of blood transfusion    "more than a few times" (12/11/2011)   History of stomach ulcers    Hypotension    Iron deficiency anemia    New onset a-fib ([place])    10/15/14 EKG   Osteopenia    Peripheral neuropathy 11/2018   Pressure ulcer of sacral region, stage 1 07/2019   Seizures ([place]) 1994   "post transplant; only have had that one" (12/11/2011)   Spinal stenosis in cervical region    Stroke [facility])     left basal ganglia lacunar infarct; Right frontal lobe  lacunar infarct.   Stroke [facility]) ~ 1999; 2001   "briefly lost my vision; lost my right eye" (12/11/2011)   Vitamin D deficiency 12/2018   Past Surgical History:  Procedure Laterality Date   ANTERIOR CERVICAL DECOMP/DISCECTOMY FUSION N/A 01/08/2015   Procedure: Anterior Cervical Three-Four/Four-Five Decompression/Diskectomy/Fusion;  Surgeon: Leeroy Cha, MD;  Location: [place] NEURO ORS;  Service: Neurosurgery;  Laterality: N/A;  C3-4 C4-5 Anterior cervical decompression/diskectomy/fusion   APPENDECTOMY  ~ 2004   BIOPSY  09/12/2020   Procedure: BIOPSY;  Surgeon: Milus Banister, MD;  [facility];  Service: Endoscopy;;   CATARACT EXTRACTION     right eye   COLONOSCOPY     COLONOSCOPY WITH PROPOFOL N/A 09/12/2020   Procedure: COLONOSCOPY WITH PROPOFOL;  Surgeon: Milus Banister, MD;  [facility];  Service: Endoscopy;  Laterality: N/A;   ENUCLEATION  2001   "right"   ESOPHAGOGASTRODUODENOSCOPY (EGD) WITH PROPOFOL N/A 04/21/2012   Procedure: ESOPHAGOGASTRODUODENOSCOPY (EGD) WITH PROPOFOL;  Surgeon: Milus Banister, MD;  [facility];  Service: Endoscopy;  Laterality: N/A;   ESOPHAGOGASTRODUODENOSCOPY (EGD) WITH PROPOFOL N/A 09/12/2020   Procedure: ESOPHAGOGASTRODUODENOSCOPY (EGD) WITH PROPOFOL;  Surgeon: Ardis Hughs,  Melene Plan, MD;  Location: [facility];  Service: Endoscopy;  Laterality: N/A;   INSERTION OF DIALYSIS CATHETER  1988   "AV graft LUA & LFA; LUA worked for 1 day; LFA never worked"   IR FLUORO GUIDE CV LINE RIGHT  07/11/2019   IR FLUORO GUIDE CV LINE RIGHT  10/20/2019   IR FLUORO GUIDE CV LINE RIGHT  05/31/2020   IR PTA VENOUS EXCEPT DIALYSIS CIRCUIT  05/31/2020   IR RADIOLOGY PERIPHERAL GUIDED IV START  07/11/2019   IR US GUIDE VASC ACCESS RIGHT  07/11/2019   IR US GUIDE VASC ACCESS RIGHT  07/11/2019   IR US GUIDE VASC ACCESS RIGHT  07/11/2019   IR VENOCAVAGRAM IVC  05/31/2020   KIDNEY TRANSPLANT  1994; 1999; 2005   "right"   MULTIPLE TOOTH EXTRACTIONS      POLYPECTOMY  09/12/2020   Procedure: POLYPECTOMY;  Surgeon: Milus Banister, MD;  [facility];  Service: Endoscopy;;   RIGHT HEART CATH N/A 03/23/2019   Procedure: RIGHT HEART CATH;  Surgeon: Jolaine Artist, MD;  Location: [place] CV LAB;  Service: Cardiovascular;  Laterality: N/A;   [place]?; 1994; 2005   Patient Active Problem List   Diagnosis Date Noted   Prolonged Q-T interval on ECG 09/07/2021   Nausea & vomiting 09/06/2021   Bilateral shoulder pain 06/06/2021   Bilateral hand swelling 06/06/2021   Cervical radiculitis 01/24/2021   Acute respiratory failure with hypoxia ([place]) 10/23/2020   Coagulation defect, unspecified ([place]) 08/01/2020   Aortic atherosclerosis ([place]) 07/10/2020   Secondary hypercoagulable state ([place]) 04/23/2020   Atrial flutter ([place]) 03/29/2020   Chronic pain of both knees 03/13/2020   Chills (without fever) 12/12/2019   Other pancytopenia ([place])    Decubitus ulcer of sacral region, stage 1 08/18/2019   HCAP (healthcare-associated pneumonia) 08/05/2019   Pressure injury of skin 07/24/2019   Chest congestion 07/24/2019   Itching 07/24/2019   Weakness 06/13/2019   Pneumonia due to COVID-19 virus 05/09/2019   Hypotension 05/09/2019   Symptomatic anemia 05/09/2019   CHF (congestive heart failure) ([place]) 05/09/2019   Swollen abdomen 01/04/2019   DDD (degenerative disc disease), cervical 12/06/2018   Neuropathy 12/06/2018   Paresthesia 10/11/2018   Neck pain 10/11/2018   Tobacco abuse 11/20/2017   Right leg swelling 11/20/2017   Right leg pain 11/20/2017   Stroke (cerebrum) ([place]) 11/20/2017   GERD (gastroesophageal reflux disease) 11/20/2017   Acute venous embolism and thrombosis of deep vessels of proximal lower extremity, right ([place]) 11/20/2017   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    Thrush, oral    AKI (acute kidney injury) ([place])    Multifocal pneumonia  08/09/2017   ETD (Eustachian tube dysfunction), bilateral 08/03/2017   Acute recurrent pansinusitis 07/21/2017   Chest pain 09/29/2015   Cervical stenosis of spinal canal 01/08/2015   Urinary tract infectious disease    Muscle spasms of neck 06/15/2014   Bleeding hemorrhoid 06/15/2014   Anemia in chronic kidney disease 06/15/2014   Pyelonephritis, acute 06/13/2014   Sepsis ([place]) 06/13/2014   History of kidney transplant    Abnormal CT scan 09/14/2012   Chronic pain syndrome 04/09/2012   Dehydration, mild 04/09/2012   Herpes infection 06/23/2011   Anxiety 06/23/2011   E coli bacteremia 06/18/2011   ESRD (end stage renal disease) ([place]) 06/12/2011   UTI (lower urinary tract infection) 06/12/2011   Bacteremia due to Gram-negative  bacteria 05/23/2011   History of renal transplantation 05/22/2011   Septic shock(785.52) 05/22/2011   Acute on chronic kidney failure ([place]) 05/22/2011   Gastroparesis 04/24/2008   WEIGHT LOSS 08/24/2007   NAUSEA AND VOMITING 08/24/2007    THERAPY DIAG:  Chronic left shoulder pain  Chronic right shoulder pain  Muscle weakness (generalized)   Rationale for Evaluation and Treatment Rehabilitation  REFERRING DIAG:  Bil shoulder adhesive capuslitis   PERTINENT HISTORY: Hx of stroke, DVT, CHF, Afib, Neuropathy, neck pain, gout, DDD, ESRD, cervical stenosis, dialysis (T, Th, sat), kidney transplant (failed)      PRECAUTIONS/RESTRICTIONS:   see pertinent history   SUBJECTIVE:  Pt reports that she is starting to have a bit more pain and is worried that the steroid is wearing off.  Pain:  Are you having pain? No Pain location: diffuse shoulder pain R>L NPRS scale:  current 3/10  Aggravating factors: reaching, lifting, activity Relieving factors: rest Pain description: constant and aching Stage: Chronic 24 hour pattern: worse with activity   OBJECTIVE: (objective measures completed at initial evaluation unless otherwise dated)  ASTERISK  SIGNS     Asterisk Signs Eval (12/10/2021)  1/17          Functional ER To ear bil            Flexion  R 6.2 lbs, L 7.5 lbs            Comb hair unable             flexion   L 90 R 100                               TREATMENT 11/17:  Therapeutic Exercise: - UBE 2' fwd - dowel flexion (reclined) - 3x10 - Row - GTB - 2x10 - shoulder ext - YTB - 2x10  Manual Therapy: - GII - III GH AP mobilization - ER and flexion ROM with gentle oscillations at end range  10 min heat following session  ASSESSMENT:   CLINICAL IMPRESSION: Enijah tolerated session well with no adverse reaction.  We concentrated on gentle ROM followed by strengthening for elevation.  Pt reports increased pain and is worried the steroid is wearing off.  I encouraged her to avoid agging activities.  Continue per POC.   OBJECTIVE IMPAIRMENTS: Pain, shoulder ROM, shoulder strength   ACTIVITY LIMITATIONS: housework, lifting, reaching   PERSONAL FACTORS: See medical history and pertinent history     REHAB POTENTIAL: Fair chronic, recurring, complex medical history   CLINICAL DECISION MAKING: Stable/uncomplicated   EVALUATION COMPLEXITY: Low     GOALS:     SHORT TERM GOALS: Target date: 01/07/2022   Marja will be >75% HEP compliant to improve carryover between sessions and facilitate independent management of condition   Evaluation (12/10/2021): ongoing Goal status: INITIAL     LONG TERM GOALS: Target date: 02/04/2022   Lakynn will show a >/= 15 pt improvement in her QUICK DASH score (MCID is 13.4 pts or 8%) as a proxy for functional improvement    Evaluation/Baseline (12/10/2021): 29 pts Goal status: INITIAL     2.  Cortez will be able to comb her hair, not limited by pain or ROM   Evaluation/Baseline (12/10/2021): limited Goal status: INITIAL     3.  Rosamaria will be able to reach to low shelf, not limited by pain or ROM   Evaluation/Baseline (12/10/2021): limited Goal status: INITIAL  4.  Jesusita will demonstrate >110 degrees of active ROM in flexion to allow completion of activities involving reaching [place], not limited by pain   Evaluation/Baseline (12/10/2021):    UPPER EXTREMITY AROM:   AROM/PROM Right 12/10/2021 Left 12/10/2021  Shoulder flexion 90/100 80/90  Shoulder abduction 70 70  Shoulder internal rotation      Shoulder external rotation      Functional IR PSIS PSIS  Functional ER ear ear  Shoulder extension      Elbow extension      Elbow flexion        (Blank rows = not tested, N = WNL, * = concordant pain with testing)   Goal status: INITIAL       PLAN: PT FREQUENCY: 1-2x/week   PT DURATION: 8 weeks (Ending 02/04/2022)   PLANNED INTERVENTIONS: Therapeutic exercises, Aquatic therapy, Therapeutic activity, Neuro Muscular re-education, Gait training, Patient/Family education, Joint mobilization, Dry Needling, Electrical stimulation, Spinal mobilization and/or manipulation, Moist heat, Taping, Vasopneumatic device, Ionotophoresis '4mg'$ /ml Dexamethasone, and Manual therapy   PLAN FOR NEXT SESSION: progressive shoulder strengthening, MT for bil shoulder capsules, ROM progression   Kevan Ny Lindsey Hommel PT 12/19/2021, 7:58 AM

## 2021-12-23 ENCOUNTER — Ambulatory Visit: Payer: Medicare Other | Admitting: Physical Therapy

## 2021-12-24 ENCOUNTER — Encounter: Payer: Medicare Other | Admitting: Physical Therapy

## 2021-12-29 ENCOUNTER — Ambulatory Visit: Payer: Medicaid Other | Admitting: Internal Medicine

## 2021-12-31 ENCOUNTER — Ambulatory Visit: Payer: Medicare Other | Admitting: Physical Therapy

## 2021-12-31 ENCOUNTER — Encounter: Payer: Self-pay | Admitting: Physical Therapy

## 2021-12-31 DIAGNOSIS — M25611 Stiffness of right shoulder, not elsewhere classified: Secondary | ICD-10-CM

## 2021-12-31 DIAGNOSIS — M25512 Pain in left shoulder: Secondary | ICD-10-CM | POA: Diagnosis not present

## 2021-12-31 DIAGNOSIS — G8929 Other chronic pain: Secondary | ICD-10-CM

## 2021-12-31 DIAGNOSIS — M25612 Stiffness of left shoulder, not elsewhere classified: Secondary | ICD-10-CM

## 2021-12-31 DIAGNOSIS — M6281 Muscle weakness (generalized): Secondary | ICD-10-CM

## 2021-12-31 NOTE — Therapy (Signed)
OUTPATIENT PHYSICAL THERAPY TREATMENT NOTE   Patient Name: Tina Watkins MRN: [id number] DOB:04/21/1965, 56 y.o., female Today's Date: 12/31/2021  PCP: Bo Merino I, NP   REFERRING PROVIDER: Cleophas Dunker, *   PT End of Session - 12/31/21 0915     Visit Number 3    Number of Visits --   1-2x/week   Date for PT Re-Evaluation 02/04/22    Authorization Type MEDICARE PART A AND B; MEDICAID [place] ACCESS    PT Start Time 0915    PT Stop Time 0956    PT Time Calculation (min) 41 min             Past Medical History:  Diagnosis Date   Bacteremia due to Gram-negative bacteria 05/23/2011   Blind    right eye   Blind right eye    CHF (congestive heart failure) ([place])    Chronic lower back pain    Complication of anesthesia    "woke up during OR; I have an extremely high tolerance" (12/11/2011) 1 procedure was graft; the other procedures were procedures that are typically done with sedation.   DDD (degenerative disc disease), cervical    Depression    Dysrhythmia    "tachycardia" (12/11/2011) new onset afib 10/15/14 EKG   E coli bacteremia 06/18/2011   Elevated LDL cholesterol level 12/2018   ESRD (end stage renal disease) ([place]) 06/12/2011   Fibromyalgia    Gastroparesis    Gastroparesis    GERD (gastroesophageal reflux disease)    Glaucoma    right eye   Gout    H/O carpal tunnel syndrome    Headache 10/2019   Headache(784.0)    "not often anymore" (12/11/2011)   Herpes genitalia 1994   History of blood transfusion    "more than a few times" (12/11/2011)   History of stomach ulcers    Hypotension    Iron deficiency anemia    New onset a-fib ([place])    10/15/14 EKG   Osteopenia    Peripheral neuropathy 11/2018   Pressure ulcer of sacral region, stage 1 07/2019   Seizures ([place]) 1994   "post transplant; only have had that one" (12/11/2011)   Spinal stenosis in cervical region    Stroke [facility])     left basal ganglia lacunar infarct; Right frontal lobe  lacunar infarct.   Stroke [facility]) ~ 1999; 2001   "briefly lost my vision; lost my right eye" (12/11/2011)   Vitamin D deficiency 12/2018   Past Surgical History:  Procedure Laterality Date   ANTERIOR CERVICAL DECOMP/DISCECTOMY FUSION N/A 01/08/2015   Procedure: Anterior Cervical Three-Four/Four-Five Decompression/Diskectomy/Fusion;  Surgeon: Leeroy Cha, MD;  Location: [place] NEURO ORS;  Service: Neurosurgery;  Laterality: N/A;  C3-4 C4-5 Anterior cervical decompression/diskectomy/fusion   APPENDECTOMY  ~ 2004   BIOPSY  09/12/2020   Procedure: BIOPSY;  Surgeon: Milus Banister, MD;  [facility];  Service: Endoscopy;;   CATARACT EXTRACTION     right eye   COLONOSCOPY     COLONOSCOPY WITH PROPOFOL N/A 09/12/2020   Procedure: COLONOSCOPY WITH PROPOFOL;  Surgeon: Milus Banister, MD;  [facility];  Service: Endoscopy;  Laterality: N/A;   ENUCLEATION  2001   "right"   ESOPHAGOGASTRODUODENOSCOPY (EGD) WITH PROPOFOL N/A 04/21/2012   Procedure: ESOPHAGOGASTRODUODENOSCOPY (EGD) WITH PROPOFOL;  Surgeon: Milus Banister, MD;  [facility];  Service: Endoscopy;  Laterality: N/A;   ESOPHAGOGASTRODUODENOSCOPY (EGD) WITH PROPOFOL N/A 09/12/2020   Procedure: ESOPHAGOGASTRODUODENOSCOPY (EGD) WITH PROPOFOL;  Surgeon: Ardis Hughs,  Melene Plan, MD;  Location[facility];  Service: Endoscopy;  Laterality: N/A;   INSERTION OF DIALYSIS CATHETER  1988   "AV graft LUA & LFA; LUA worked for 1 day; LFA never worked"   IR FLUORO GUIDE CV LINE RIGHT  07/11/2019   IR FLUORO GUIDE CV LINE RIGHT  10/20/2019   IR FLUORO GUIDE CV LINE RIGHT  05/31/2020   IR PTA VENOUS EXCEPT DIALYSIS CIRCUIT  05/31/2020   IR RADIOLOGY PERIPHERAL GUIDED IV START  07/11/2019   IR US GUIDE VASC ACCESS RIGHT  07/11/2019   IR US GUIDE VASC ACCESS RIGHT  07/11/2019   IR US GUIDE VASC ACCESS RIGHT  07/11/2019   IR VENOCAVAGRAM IVC  05/31/2020   KIDNEY TRANSPLANT  1994; 1999; 2005   "right"   MULTIPLE TOOTH EXTRACTIONS      POLYPECTOMY  09/12/2020   Procedure: POLYPECTOMY;  Surgeon: Milus Banister, MD;  [facility];  Service: Endoscopy;;   RIGHT HEART CATH N/A 03/23/2019   Procedure: RIGHT HEART CATH;  Surgeon: Jolaine Artist, MD;  Location: [place] CV LAB;  Service: Cardiovascular;  Laterality: N/A;   [place]?; 1994; 2005   Patient Active Problem List   Diagnosis Date Noted   Prolonged Q-T interval on ECG 09/07/2021   Nausea & vomiting 09/06/2021   Bilateral shoulder pain 06/06/2021   Bilateral hand swelling 06/06/2021   Cervical radiculitis 01/24/2021   Acute respiratory failure with hypoxia ([place]) 10/23/2020   Coagulation defect, unspecified ([place]) 08/01/2020   Aortic atherosclerosis ([place]) 07/10/2020   Secondary hypercoagulable state ([place]) 04/23/2020   Atrial flutter ([place]) 03/29/2020   Chronic pain of both knees 03/13/2020   Chills (without fever) 12/12/2019   Other pancytopenia ([place])    Decubitus ulcer of sacral region, stage 1 08/18/2019   HCAP (healthcare-associated pneumonia) 08/05/2019   Pressure injury of skin 07/24/2019   Chest congestion 07/24/2019   Itching 07/24/2019   Weakness 06/13/2019   Pneumonia due to COVID-19 virus 05/09/2019   Hypotension 05/09/2019   Symptomatic anemia 05/09/2019   CHF (congestive heart failure) ([place]) 05/09/2019   Swollen abdomen 01/04/2019   DDD (degenerative disc disease), cervical 12/06/2018   Neuropathy 12/06/2018   Paresthesia 10/11/2018   Neck pain 10/11/2018   Tobacco abuse 11/20/2017   Right leg swelling 11/20/2017   Right leg pain 11/20/2017   Stroke (cerebrum) ([place]) 11/20/2017   GERD (gastroesophageal reflux disease) 11/20/2017   Acute venous embolism and thrombosis of deep vessels of proximal lower extremity, right ([place]) 11/20/2017   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    Thrush, oral    AKI (acute kidney injury) ([place])    Multifocal pneumonia  08/09/2017   ETD (Eustachian tube dysfunction), bilateral 08/03/2017   Acute recurrent pansinusitis 07/21/2017   Chest pain 09/29/2015   Cervical stenosis of spinal canal 01/08/2015   Urinary tract infectious disease    Muscle spasms of neck 06/15/2014   Bleeding hemorrhoid 06/15/2014   Anemia in chronic kidney disease 06/15/2014   Pyelonephritis, acute 06/13/2014   Sepsis ([place]) 06/13/2014   History of kidney transplant    Abnormal CT scan 09/14/2012   Chronic pain syndrome 04/09/2012   Dehydration, mild 04/09/2012   Herpes infection 06/23/2011   Anxiety 06/23/2011   E coli bacteremia 06/18/2011   ESRD (end stage renal disease) ([place]) 06/12/2011   UTI (lower urinary tract infection) 06/12/2011   Bacteremia due to Gram-negative  bacteria 05/23/2011   History of renal transplantation 05/22/2011   Septic shock(785.52) 05/22/2011   Acute on chronic kidney failure ([place]) 05/22/2011   Gastroparesis 04/24/2008   WEIGHT LOSS 08/24/2007   NAUSEA AND VOMITING 08/24/2007    THERAPY DIAG:  Chronic left shoulder pain  Chronic right shoulder pain  Muscle weakness (generalized)  Stiffness of left shoulder, not elsewhere classified  Stiffness of right shoulder, not elsewhere classified   Rationale for Evaluation and Treatment Rehabilitation  REFERRING DIAG:  Bil shoulder adhesive capuslitis   PERTINENT HISTORY: Hx of stroke, DVT, CHF, Afib, Neuropathy, neck pain, gout, DDD, ESRD, cervical stenosis, dialysis (T, Th, sat), kidney transplant (failed)      PRECAUTIONS/RESTRICTIONS:   see pertinent history   SUBJECTIVE:  Pt reports that she was sore after last visit.  She is unsure if the exercise is helping or not.  Pain:  Are you having pain? No Pain location: diffuse shoulder pain R>L NPRS scale:  current 3/10  Aggravating factors: reaching, lifting, activity Relieving factors: rest Pain description: constant and aching Stage: Chronic 24 hour pattern: worse with activity    OBJECTIVE: (objective measures completed at initial evaluation unless otherwise dated)  ASTERISK SIGNS     Asterisk Signs Eval (12/10/2021)  1/17  11/29        Functional ER To ear bil            Flexion  R 6.2 lbs, L 7.5 lbs            Comb hair unable   unable          flexion   L 90 R 100                               TREATMENT 11/29:  Therapeutic Exercise: - nu-step - L5 - 3' - 100 steps - dowel chest press (reclined) - 3x10 - Low row - 3x10 - 10# - high row - 3x10 - 5#  Manual Therapy: - ER and flexion ROM with gentle oscillations at end range  10 min heat following session  ASSESSMENT:   CLINICAL IMPRESSION: Chiyoko tolerated session well with no adverse reaction.  We concentrated on gentle ROM.  She does show decreased ER L>R.  She is still significantly limited by pain.  We discussed limiting highly agging activities to keep pain under control (excessive pain is counter productive).   OBJECTIVE IMPAIRMENTS: Pain, shoulder ROM, shoulder strength   ACTIVITY LIMITATIONS: housework, lifting, reaching   PERSONAL FACTORS: See medical history and pertinent history     REHAB POTENTIAL: Fair chronic, recurring, complex medical history   CLINICAL DECISION MAKING: Stable/uncomplicated   EVALUATION COMPLEXITY: Low     GOALS:     SHORT TERM GOALS: Target date: 01/07/2022   Rejeana will be >75% HEP compliant to improve carryover between sessions and facilitate independent management of condition   Evaluation (12/10/2021): ongoing Goal status: INITIAL     LONG TERM GOALS: Target date: 02/04/2022   Sae will show a >/= 15 pt improvement in her QUICK DASH score (MCID is 13.4 pts or 8%) as a proxy for functional improvement    Evaluation/Baseline (12/10/2021): 29 pts Goal status: INITIAL     2.  Avia will be able to comb her hair, not limited by pain or ROM   Evaluation/Baseline (12/10/2021): limited Goal status: INITIAL     3.  Lanayah will be  able to reach to low  shelf, not limited by pain or ROM   Evaluation/Baseline (12/10/2021): limited Goal status: INITIAL     4.  Shamicka will demonstrate >110 degrees of active ROM in flexion to allow completion of activities involving reaching [place], not limited by pain   Evaluation/Baseline (12/10/2021):    UPPER EXTREMITY AROM:   AROM/PROM Right 12/10/2021 Left 12/10/2021  Shoulder flexion 90/100 80/90  Shoulder abduction 70 70  Shoulder internal rotation      Shoulder external rotation      Functional IR PSIS PSIS  Functional ER ear ear  Shoulder extension      Elbow extension      Elbow flexion        (Blank rows = not tested, N = WNL, * = concordant pain with testing)   Goal status: INITIAL       PLAN: PT FREQUENCY: 1-2x/week   PT DURATION: 8 weeks (Ending 02/04/2022)   PLANNED INTERVENTIONS: Therapeutic exercises, Aquatic therapy, Therapeutic activity, Neuro Muscular re-education, Gait training, Patient/Family education, Joint mobilization, Dry Needling, Electrical stimulation, Spinal mobilization and/or manipulation, Moist heat, Taping, Vasopneumatic device, Ionotophoresis '4mg'$ /ml Dexamethasone, and Manual therapy   PLAN FOR NEXT SESSION: progressive shoulder strengthening, MT for bil shoulder capsules, ROM progression   Derrico Zhong E Jahnasia Tatum PT 12/31/2021, 10:00 AM

## 2022-01-06 IMAGING — XA IR FLUORO GUIDE CV LINE*R*
5 series · 13 of 24 positions shown · IV contrast (IODINE)
Comparison: Fluoroscopic guided right femoral approach dialysis
catheter placement-07/11/2019;

INDICATION: Poorly functioning dialysis catheter.

[Series 1: fl (-) angio · 1 of 2 slices shown (1 of 2)]
[im 1/2]
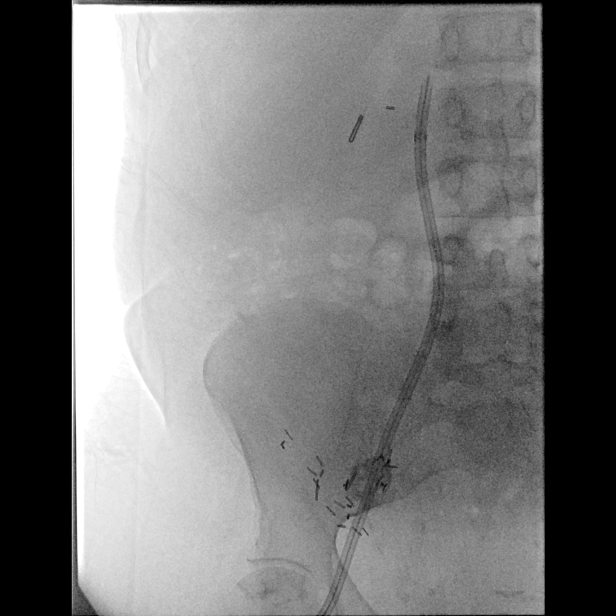

[Series 2: body 4 care · 4 of 9 slices shown (1 of 3)]
[im 1/9]
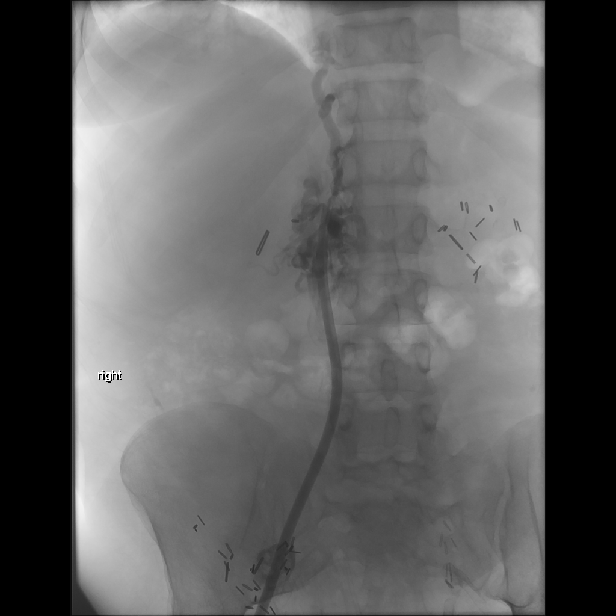
[im 3/9]
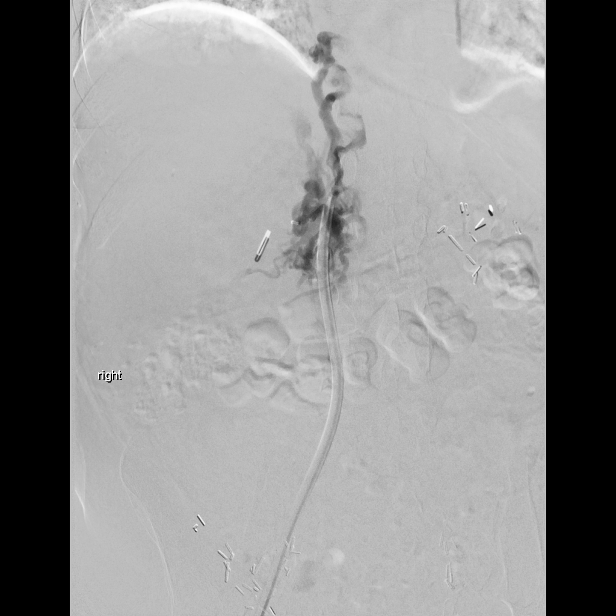
[im 5/9]
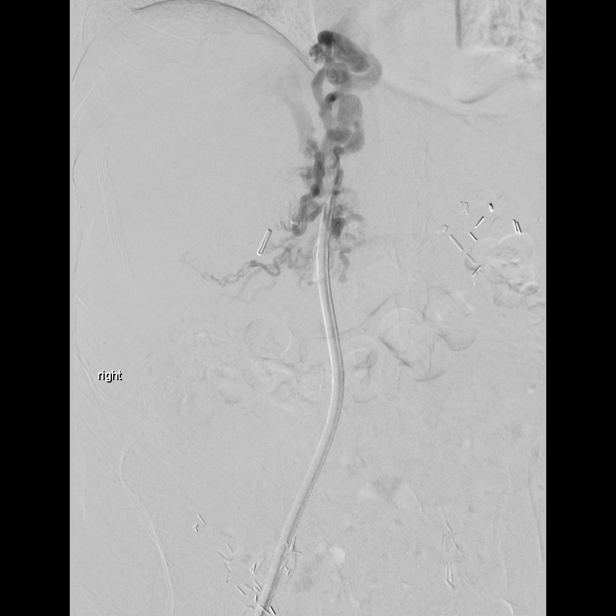
[im 7/9]
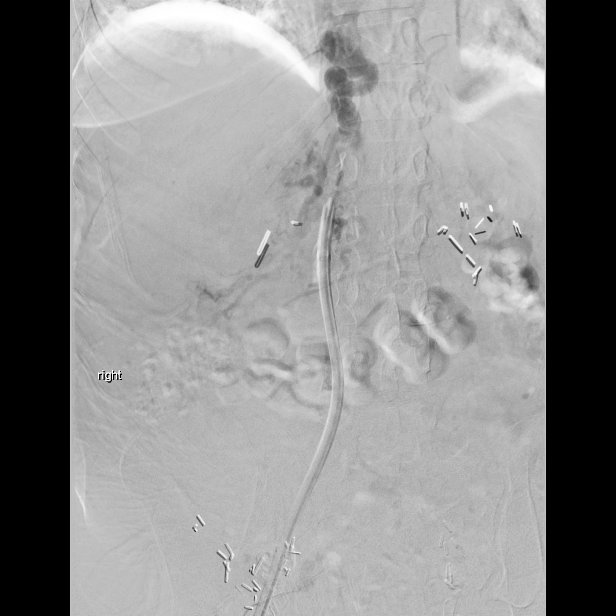

[Series 3: body 4 care · 4 of 8 slices shown (2 of 3)]
[im 1/8]
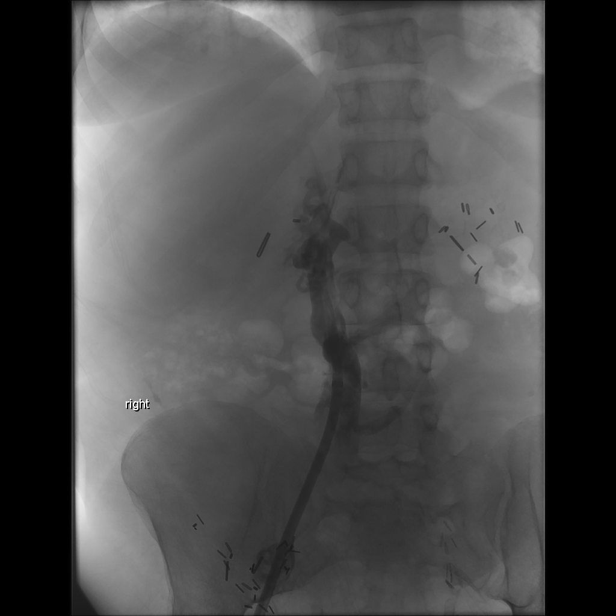
[im 3/8]
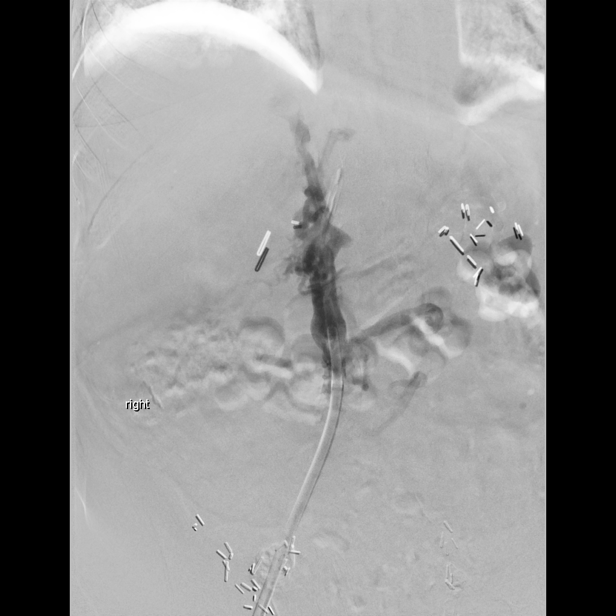
[im 4/8]
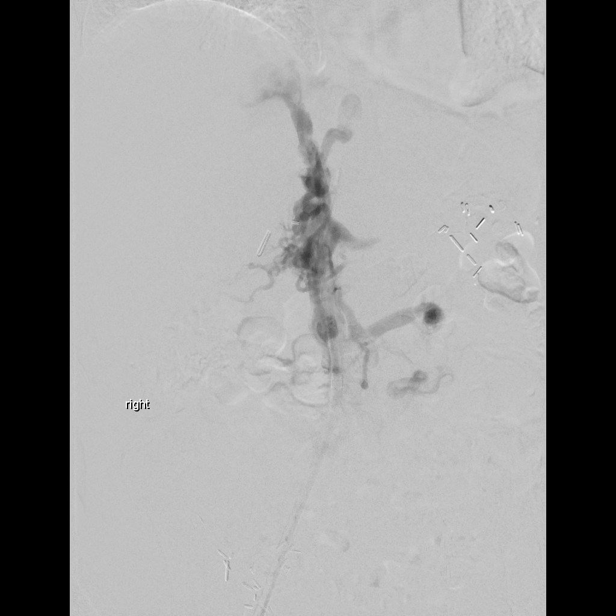
[im 6/8]
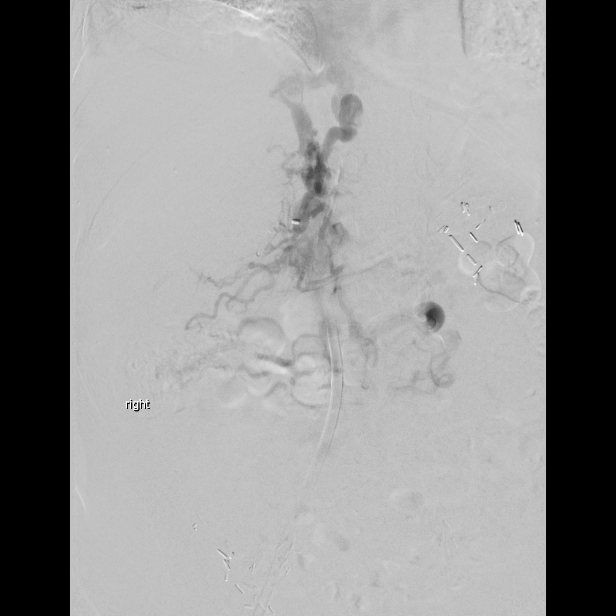

[Series 4: body 4 care · 3 of 6 slices shown (3 of 3)]
[im 1/6]
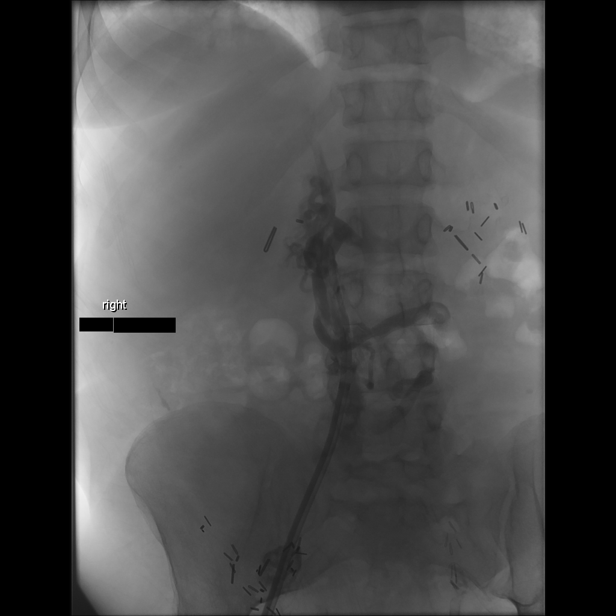
[im 3/6]
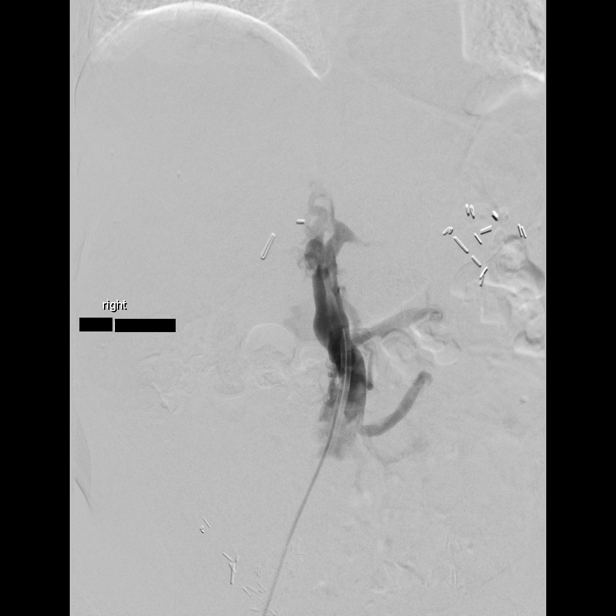
[im 5/6]
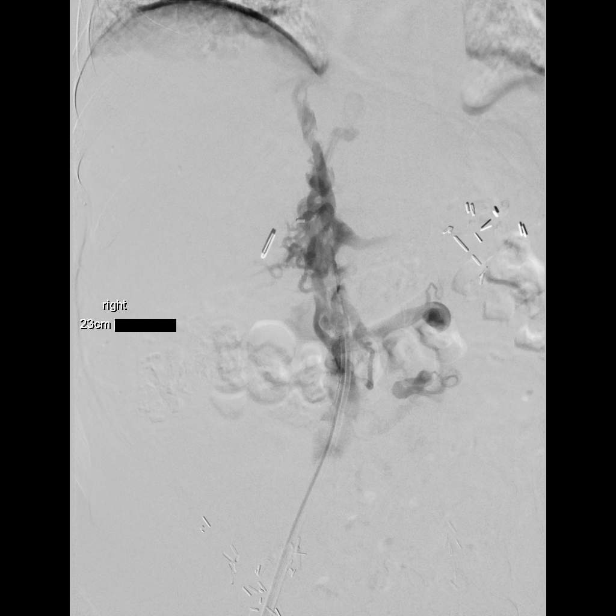

[Series 5: fl (-) angio · 1 of 1 slices shown (2 of 2)]
[im 1/1]
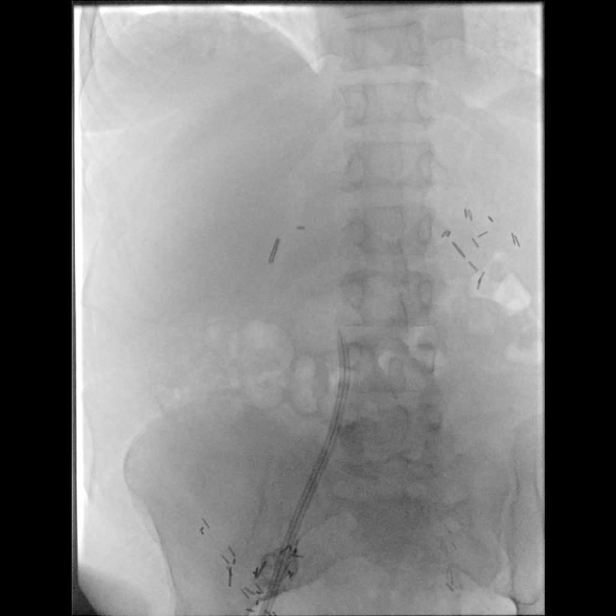

[13 of 24 positions shown; findings below may reference images not displayed]

History of end-stage renal disease, post 3 previous now failed renal
transplants, now catheter dependent.

The patient underwent right femoral approach dialysis catheter
placement on 07/11/2019 at which time there was attempt to place a
right internal jugular approach dialysis catheter however limited
central venogram demonstrated occlusion of the SVC and upper
extremity central venous system.

Additionally, the right femoral approach dialysis catheter was
unable to be advanced to the level of the inferior cavoatrial
junction secondary to presumed venous occlusion suggested on CT of
the abdomen pelvis performed 06/11/2019

Patient presents now for fluoroscopic guided dialysis catheter
exchange.

EXAM:
1. TUNNELED CENTRAL VENOUS HEMODIALYSIS CATHETER REPLACEMENT WITH
FLUOROSCOPIC GUIDANCE
2. INFERIOR VENA CAVAGRAM VIA EXISTING DIALYSIS CATHETER
CT abdomen pelvis-05/12/2019

MEDICATIONS:
Ancef 2 g IV; the antibiotics were administered with an appropriate
time frame prior to the initiation of procedure.

CONTRAST:  20 cc Omnipaque 300

FLUOROSCOPY TIME:  54 seconds (30 mGy)

COMPLICATIONS:
None immediate.

PROCEDURE:
Informed written consent was obtained from the patient after a
discussion of the risks, benefits, and alternatives to treatment.
Questions regarding the procedure were encouraged and answered. The
skin and external portion of the existing hemodialysis catheter was
prepped with chlorhexidine in a sterile fashion, and a sterile drape
was applied covering the operative field. Maximum barrier sterile
technique with sterile gowns and gloves were used for the procedure.
A timeout was performed prior to the initiation of the procedure.

Preprocedural spot fluoroscopic image was obtained of the existing
dialysis catheter

The dialysis catheter was noted to be difficult to aspirate however
the heparin dwell was able to be evacuated.

This was followed by contrast injection via the dialysis catheter
demonstrating occlusion of the intrahepatic segment of the IVC with
filling of multiple hypertrophied retroperitoneal venous
collaterals.

The dialysis catheter was retracted to a more caudal aspect of the
IVC at which location it was noted to easily aspirate and flush.
Contrast injection was performed demonstrated irregular narrowing
though patency of the mid aspect of the IVC.

Next, both lumens of the dialysis catheter can with stiff glide
wires which were advanced to level of the occlusion

Under fluoroscopic guided exchange, a new, slightly smaller, now 23
tip to cuff palindrome hemodialysis catheter was placed with tip
ultimately positioned within the widest portion of IVC. Both lumens
of the catheter were noted to easily aspirate flush. Postprocedural
spot fluoroscopic image was obtained.

The catheter was secured to the skin entrance site with an
interrupted suture as well as an additional encircling interrupted
suture.

Both lumens were heparinized. A dressing was placed. The patient
tolerated the procedure well without immediate post procedural
complication.
IMPRESSION: 1. Successful replacement of slightly shorter now 23 cm tip to cuff
tunneled hemodialysis catheter with tips terminating within the mid
aspect of the IVC. The catheter is ready for immediate use.
2. Contrast injection demonstrates complete occlusion of the
intrahepatic segment of the IVC with filling of multiple
hypertrophied retroperitoneal venous collaterals.

PLAN:
Would NOT recommend removal of dialysis catheter in the setting of
bacteremia given central venous occlusion involving now both the SVC
as well as the IVC. Rather, would recommend fluoroscopic guided
exchange with attempt to treat through any suspected infection.

Ultimately, if this dialysis catheter proves non durable, the next
(and potentially last) access target would be placement of a
transhepatic dialysis catheter.

## 2022-01-06 NOTE — Therapy (Signed)
OUTPATIENT PHYSICAL THERAPY TREATMENT NOTE   Patient Name: Tina Watkins MRN: [id number] DOB:11/14/1965, 56 y.o., female Today's Date: 01/07/2022  PCP: Bo Merino I, NP   REFERRING PROVIDER: Cleophas Dunker, *   PT End of Session - 01/07/22 1051     Visit Number 4    Date for PT Re-Evaluation 02/04/22    Authorization Type MEDICARE PART A AND B; MEDICAID [place] ACCESS    PT Start Time 1050    PT Stop Time 1130    PT Time Calculation (min) 40 min    Activity Tolerance Patient tolerated treatment well;Patient limited by pain    Behavior During Therapy [facility] for tasks assessed/performed              Past Medical History:  Diagnosis Date   Bacteremia due to Gram-negative bacteria 05/23/2011   Blind    right eye   Blind right eye    CHF (congestive heart failure) (HCC)    Chronic lower back pain    Complication of anesthesia    "woke up during OR; I have an extremely high tolerance" (12/11/2011) 1 procedure was graft; the other procedures were procedures that are typically done with sedation.   DDD (degenerative disc disease), cervical    Depression    Dysrhythmia    "tachycardia" (12/11/2011) new onset afib 10/15/14 EKG   E coli bacteremia 06/18/2011   Elevated LDL cholesterol level 12/2018   ESRD (end stage renal disease) ([place]) 06/12/2011   Fibromyalgia    Gastroparesis    Gastroparesis    GERD (gastroesophageal reflux disease)    Glaucoma    right eye   Gout    H/O carpal tunnel syndrome    Headache 10/2019   Headache(784.0)    "not often anymore" (12/11/2011)   Herpes genitalia 1994   History of blood transfusion    "more than a few times" (12/11/2011)   History of stomach ulcers    Hypotension    Iron deficiency anemia    New onset a-fib ([place])    10/15/14 EKG   Osteopenia    Peripheral neuropathy 11/2018   Pressure ulcer of sacral region, stage 1 07/2019   Seizures ([place]) 1994   "post transplant; only have had that one" (12/11/2011)   Spinal  stenosis in cervical region    Stroke [facility])     left basal ganglia lacunar infarct; Right frontal lobe lacunar infarct.   Stroke [facility]) ~ 1999; 2001   "briefly lost my vision; lost my right eye" (12/11/2011)   Vitamin D deficiency 12/2018   Past Surgical History:  Procedure Laterality Date   ANTERIOR CERVICAL DECOMP/DISCECTOMY FUSION N/A 01/08/2015   Procedure: Anterior Cervical Three-Four/Four-Five Decompression/Diskectomy/Fusion;  Surgeon: Leeroy Cha, MD;  Location: [place] NEURO ORS;  Service: Neurosurgery;  Laterality: N/A;  C3-4 C4-5 Anterior cervical decompression/diskectomy/fusion   APPENDECTOMY  ~ 2004   BIOPSY  09/12/2020   Procedure: BIOPSY;  Surgeon: Milus Banister, MD;  Location: [facility] ENDOSCOPY;  Service: Endoscopy;;   CATARACT EXTRACTION     right eye   COLONOSCOPY     COLONOSCOPY WITH PROPOFOL N/A 09/12/2020   Procedure: COLONOSCOPY WITH PROPOFOL;  Surgeon: Milus Banister, MD;  Location: [facility] ENDOSCOPY;  Service: Endoscopy;  Laterality: N/A;   ENUCLEATION  2001   "right"   ESOPHAGOGASTRODUODENOSCOPY (EGD) WITH PROPOFOL N/A 04/21/2012   Procedure: ESOPHAGOGASTRODUODENOSCOPY (EGD) WITH PROPOFOL;  Surgeon: Milus Banister, MD;  Location: [facility] ENDOSCOPY;  Service: Endoscopy;  Laterality: N/A;   ESOPHAGOGASTRODUODENOSCOPY (EGD)  WITH PROPOFOL N/A 09/12/2020   Procedure: ESOPHAGOGASTRODUODENOSCOPY (EGD) WITH PROPOFOL;  Surgeon: Milus Banister, MD;  Location: [facility] ENDOSCOPY;  Service: Endoscopy;  Laterality: N/A;   INSERTION OF DIALYSIS CATHETER  1988   "AV graft LUA & LFA; LUA worked for 1 day; LFA never worked"   IR FLUORO GUIDE CV LINE RIGHT  07/11/2019   IR FLUORO GUIDE CV LINE RIGHT  10/20/2019   IR FLUORO GUIDE CV LINE RIGHT  05/31/2020   IR PTA VENOUS EXCEPT DIALYSIS CIRCUIT  05/31/2020   IR RADIOLOGY PERIPHERAL GUIDED IV START  07/11/2019   IR US GUIDE VASC ACCESS RIGHT  07/11/2019   IR US GUIDE VASC ACCESS RIGHT  07/11/2019   IR US GUIDE VASC ACCESS RIGHT  07/11/2019   IR VENOCAVAGRAM IVC   05/31/2020   KIDNEY TRANSPLANT  1994; 1999; 2005   "right"   MULTIPLE TOOTH EXTRACTIONS     POLYPECTOMY  09/12/2020   Procedure: POLYPECTOMY;  Surgeon: Milus Banister, MD;  Location: [facility] ENDOSCOPY;  Service: Endoscopy;;   RIGHT HEART CATH N/A 03/23/2019   Procedure: RIGHT HEART CATH;  Surgeon: Jolaine Artist, MD;  Location: [place] CV LAB;  Service: Cardiovascular;  Laterality: N/A;   [place]?; 1994; 2005   Patient Active Problem List   Diagnosis Date Noted   Prolonged Q-T interval on ECG 09/07/2021   Nausea & vomiting 09/06/2021   Bilateral shoulder pain 06/06/2021   Bilateral hand swelling 06/06/2021   Cervical radiculitis 01/24/2021   Acute respiratory failure with hypoxia ([place]) 10/23/2020   Coagulation defect, unspecified ([place]) 08/01/2020   Aortic atherosclerosis ([place]) 07/10/2020   Secondary hypercoagulable state ([place]) 04/23/2020   Atrial flutter ([place]) 03/29/2020   Chronic pain of both knees 03/13/2020   Chills (without fever) 12/12/2019   Other pancytopenia ([place])    Decubitus ulcer of sacral region, stage 1 08/18/2019   HCAP (healthcare-associated pneumonia) 08/05/2019   Pressure injury of skin 07/24/2019   Chest congestion 07/24/2019   Itching 07/24/2019   Weakness 06/13/2019   Pneumonia due to COVID-19 virus 05/09/2019   Hypotension 05/09/2019   Symptomatic anemia 05/09/2019   CHF (congestive heart failure) ([place]) 05/09/2019   Swollen abdomen 01/04/2019   DDD (degenerative disc disease), cervical 12/06/2018   Neuropathy 12/06/2018   Paresthesia 10/11/2018   Neck pain 10/11/2018   Tobacco abuse 11/20/2017   Right leg swelling 11/20/2017   Right leg pain 11/20/2017   Stroke (cerebrum) ([place]) 11/20/2017   GERD (gastroesophageal reflux disease) 11/20/2017   Acute venous embolism and thrombosis of deep vessels of proximal lower extremity, right ([place]) 11/20/2017   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    Thrush, oral    AKI (acute kidney injury) ([place])    Multifocal pneumonia 08/09/2017   ETD (Eustachian tube dysfunction), bilateral 08/03/2017   Acute recurrent pansinusitis 07/21/2017   Chest pain 09/29/2015   Cervical stenosis of spinal canal 01/08/2015   Urinary tract infectious disease    Muscle spasms of neck 06/15/2014   Bleeding hemorrhoid 06/15/2014   Anemia in chronic kidney disease 06/15/2014   Pyelonephritis, acute 06/13/2014   Sepsis ([place]) 06/13/2014   History of kidney transplant    Abnormal CT scan 09/14/2012   Chronic pain syndrome 04/09/2012   Dehydration, mild 04/09/2012   Herpes infection 06/23/2011   Anxiety 06/23/2011   E coli bacteremia 06/18/2011   ESRD (end stage renal disease) ([place]) 06/12/2011  UTI (lower urinary tract infection) 06/12/2011   Bacteremia due to Gram-negative bacteria 05/23/2011   History of renal transplantation 05/22/2011   Septic shock(785.52) 05/22/2011   Acute on chronic kidney failure ([place]) 05/22/2011   Gastroparesis 04/24/2008   WEIGHT LOSS 08/24/2007   NAUSEA AND VOMITING 08/24/2007    THERAPY DIAG:  Chronic left shoulder pain  Chronic right shoulder pain  Muscle weakness (generalized)  Stiffness of left shoulder, not elsewhere classified   Rationale for Evaluation and Treatment Rehabilitation  REFERRING DIAG:  Bil shoulder adhesive capuslitis   PERTINENT HISTORY: Hx of stroke, DVT, CHF, Afib, Neuropathy, neck pain, gout, DDD, ESRD, cervical stenosis, dialysis (T, Th, sat), kidney transplant (failed)      PRECAUTIONS/RESTRICTIONS:   see pertinent history   SUBJECTIVE:  Patient reports her shoulders were feeling better yesterday but that the pain has increased again today.  Pain:  Are you having pain? Yes Pain location: diffuse shoulder pain R>L NPRS scale:  current 7/10  Aggravating factors: reaching, lifting, activity Relieving factors: rest Pain description: constant and aching Stage:  Chronic 24 hour pattern: worse with activity   OBJECTIVE: (objective measures completed at initial evaluation unless otherwise dated)  ASTERISK SIGNS     Asterisk Signs Eval (12/10/2021)  1/17  11/29        Functional ER To ear bil            Flexion  R 6.2 lbs, L 7.5 lbs            Comb hair unable   unable          flexion   L 90 R 100                            TREATMENT 12/5:  Therapeutic Exercise: - nu-step - L5 - 4' - 150 steps - dowel chest press (reclined) - 3x10 - dowel shoulder flexion (supine)  - x10 - Low row - 3x10 - 5# - high row - x10 - 5#  Manual Therapy: - ER and flexion ROM with gentle oscillations at end range  10 min heat following session   TREATMENT 11/29:  Therapeutic Exercise: - nu-step - L5 - 3' - 100 steps - dowel chest press (reclined) - 3x10 - Low row - 3x10 - 10# - high row - 3x10 - 5#  Manual Therapy: - ER and flexion ROM with gentle oscillations at end range  10 min heat following session  ASSESSMENT:   CLINICAL IMPRESSION: Patient presents to PT with increased BIL shoulder pain and reports HEP compliance. Session today continued to focus on gentle ROM of shoulders and lightweight strengthening. She is very limited by pain and was unable to complete the same weight/repetitions as last week stating "just not today." Patient continues to benefit from skilled PT services and should be progressed as able to improve functional independence.   OBJECTIVE IMPAIRMENTS: Pain, shoulder ROM, shoulder strength   ACTIVITY LIMITATIONS: housework, lifting, reaching   PERSONAL FACTORS: See medical history and pertinent history     REHAB POTENTIAL: Fair chronic, recurring, complex medical history   CLINICAL DECISION MAKING: Stable/uncomplicated   EVALUATION COMPLEXITY: Low     GOALS:     SHORT TERM GOALS: Target date: 01/07/2022   [employer] will be >75% HEP compliant to improve carryover between sessions and facilitate independent management  of condition   Evaluation (12/10/2021): ongoing Goal status: MET 01/07/22: Pt reports adherence  LONG TERM GOALS: Target date: 02/04/2022   Charlii will show a >/= 15 pt improvement in her QUICK DASH score (MCID is 13.4 pts or 8%) as a proxy for functional improvement    Evaluation/Baseline (12/10/2021): 29 pts Goal status: INITIAL     2.  Shresta will be able to comb her hair, not limited by pain or ROM   Evaluation/Baseline (12/10/2021): limited Goal status: INITIAL     3.  [place] will be able to reach to low shelf, not limited by pain or ROM   Evaluation/Baseline (12/10/2021): limited Goal status: INITIAL     4.  [place] will demonstrate >110 degrees of active ROM in flexion to allow completion of activities involving reaching [place], not limited by pain   Evaluation/Baseline (12/10/2021):    UPPER EXTREMITY AROM:   AROM/PROM Right 12/10/2021 Left 12/10/2021  Shoulder flexion 90/100 80/90  Shoulder abduction 70 70  Shoulder internal rotation      Shoulder external rotation      Functional IR PSIS PSIS  Functional ER ear ear  Shoulder extension      Elbow extension      Elbow flexion        (Blank rows = not tested, N = WNL, * = concordant pain with testing)   Goal status: INITIAL       PLAN: PT FREQUENCY: 1-2x/week   PT DURATION: 8 weeks (Ending 02/04/2022)   PLANNED INTERVENTIONS: Therapeutic exercises, Aquatic therapy, Therapeutic activity, Neuro Muscular re-education, Gait training, Patient/Family education, Joint mobilization, Dry Needling, Electrical stimulation, Spinal mobilization and/or manipulation, Moist heat, Taping, Vasopneumatic device, Ionotophoresis 32m/ml Dexamethasone, and Manual therapy   PLAN FOR NEXT SESSION: progressive shoulder strengthening, MT for bil shoulder capsules, ROM progression   [place]arg[place] CanadaPTA 01/07/2022, 10:51 AM

## 2022-01-07 ENCOUNTER — Ambulatory Visit: Payer: Medicare Other | Attending: Internal Medicine

## 2022-01-07 DIAGNOSIS — G8929 Other chronic pain: Secondary | ICD-10-CM | POA: Insufficient documentation

## 2022-01-07 DIAGNOSIS — M25512 Pain in left shoulder: Secondary | ICD-10-CM | POA: Insufficient documentation

## 2022-01-07 DIAGNOSIS — M25511 Pain in right shoulder: Secondary | ICD-10-CM | POA: Diagnosis present

## 2022-01-07 DIAGNOSIS — M25611 Stiffness of right shoulder, not elsewhere classified: Secondary | ICD-10-CM | POA: Insufficient documentation

## 2022-01-07 DIAGNOSIS — M25612 Stiffness of left shoulder, not elsewhere classified: Secondary | ICD-10-CM | POA: Diagnosis present

## 2022-01-07 DIAGNOSIS — M6281 Muscle weakness (generalized): Secondary | ICD-10-CM | POA: Diagnosis present

## 2022-01-09 ENCOUNTER — Ambulatory Visit: Payer: Medicare Other | Admitting: Physical Therapy

## 2022-01-12 ENCOUNTER — Encounter: Payer: Self-pay | Admitting: Nurse Practitioner

## 2022-01-12 ENCOUNTER — Ambulatory Visit (INDEPENDENT_AMBULATORY_CARE_PROVIDER_SITE_OTHER): Payer: Medicare Other | Admitting: Nurse Practitioner

## 2022-01-12 VITALS — BP 82/48 | HR 74 | Ht 59.0 in | Wt 116.0 lb

## 2022-01-12 DIAGNOSIS — I959 Hypotension, unspecified: Secondary | ICD-10-CM

## 2022-01-12 MED ORDER — COLCHICINE 0.6 MG PO CAPS: 0.6000 mg | ORAL_CAPSULE | Freq: Every day | ORAL | 2 refills | Status: DC | PRN

## 2022-01-12 MED ORDER — METOCLOPRAMIDE HCL 5 MG PO TABS: 5.0000 mg | ORAL_TABLET | Freq: Two times a day (BID) | ORAL | 3 refills | Status: DC

## 2022-01-12 MED ORDER — BLOOD PRESSURE CUFF MISC: 1.0000 [IU] | 0 refills | Status: DC

## 2022-01-12 NOTE — Progress Notes (Signed)
$'@Patient'O$  ID: Tina Watkins, female    DOB: 1965/04/29, 56 y.o.   MRN: [id number]  Chief Complaint  Patient presents with   Follow-up    Referring provider: Teena Dunk, NP   HPI  Tina Watkins 56 y.o. female  has a past medical history of Bacteremia due to Gram-negative bacteria (05/23/2011), Blind, Blind right eye, CHF (congestive heart failure) ([place]), Chronic lower back pain, Complication of anesthesia, DDD (degenerative disc disease), cervical, Depression, Dysrhythmia, E coli bacteremia (06/18/2011), Elevated LDL cholesterol level (12/2018), ESRD (end stage renal disease) ([place]) (06/12/2011), Fibromyalgia, Gastroparesis, Gastroparesis, GERD (gastroesophageal reflux disease), Glaucoma, Gout, H/O carpal tunnel syndrome, Headache (10/2019), Headache(784.0), Herpes genitalia (1994), History of blood transfusion, History of stomach ulcers, Hypotension, Iron deficiency anemia, New onset a-fib ([place]), Osteopenia, Peripheral neuropathy (11/2018), Pressure ulcer of sacral region, stage 1 (07/2019), Seizures ([place]) (1994), Spinal stenosis in cervical region, Stroke [facility]), Stroke ([place]) (~ 1999; 2001), and Vitamin D deficiency (12/2018). To the [facility] for 6 month follow up.   Patient presents today for a follow-up on chronic conditions.  Her blood pressure was noted to be low in the office today but she states that this is normal for her.  Patient is a dialysis patient.  She has had recent blood work through her dialysis.  She does need medication refills today.  She states that overall she has been stable since her last visit here but has no new issues or concerns today. Denies f/c/s, n/v/d, hemoptysis, PND, leg swelling Denies chest pain or edema     Allergies  Allergen Reactions   Levofloxacin Itching and Rash   Tape Other (See Comments)    "Certain surgical tapes peel off my skin"    Immunization History  Administered Date(s) Administered   Hepatitis B, PED/ADOLESCENT 07/20/2019,  08/29/2019, 09/21/2019   Hepb-cpg 07/20/2019, 08/29/2019, 09/21/2019, 11/23/2019   Influenza Split 11/07/2019   Influenza,inj,Quad PF,6+ Mos 11/07/2019, 11/07/2020   PFIZER Comirnaty(Gray Top)Covid-19 Tri-Sucrose Vaccine 07/29/2020   PFIZER(Purple Top)SARS-COV-2 Vaccination 05/31/2019, 06/21/2019, 10/26/2019   PPD Test 08/10/2017   Pfizer Covid-19 Vaccine Bivalent Booster 63yr & up 02/06/2021    Past Medical History:  Diagnosis Date   Bacteremia due to Gram-negative bacteria 05/23/2011   Blind    right eye   Blind right eye    CHF (congestive heart failure) (HCC)    Chronic lower back pain    Complication of anesthesia    "woke up during OR; I have an extremely high tolerance" (12/11/2011) 1 procedure was graft; the other procedures were procedures that are typically done with sedation.   DDD (degenerative disc disease), cervical    Depression    Dysrhythmia    "tachycardia" (12/11/2011) new onset afib 10/15/14 EKG   E coli bacteremia 06/18/2011   Elevated LDL cholesterol level 12/2018   [facility] ([facility]) [facility] 06/12/2011   Fibromyalgia    Gastroparesis    Gastroparesis    GERD (gastroesophageal reflux disease)    Glaucoma    right eye   Gout    H/O carpal tunnel syndrome    Headache 10/2019   Headache(784.0)    "not often anymore" (12/11/2011)   Herpes genitalia 1994   History of blood transfusion    "more than a few times" (12/11/2011)   History of stomach ulcers    Hypotension    Iron deficiency anemia    [place]    10/15/14 EKG   Osteopenia    Peripheral neuropathy 11/2018  Pressure ulcer of sacral region, stage 1 07/2019   Seizures ([place]) 1994   "post transplant; only have had that one" (12/11/2011)   Spinal stenosis in cervical region    Stroke [facility])     left basal ganglia lacunar infarct; Right frontal lobe lacunar infarct.   Stroke [facility]) ~ 1999; 2001   "briefly lost my vision; lost my right eye" (12/11/2011)   Vitamin D deficiency  12/2018    Tobacco History: Social History   Tobacco Use  Smoking Status Every Day   Packs/day: 1.50   Years: 35.00   Total pack years: 52.50   Types: Cigarettes   Passive exposure: Never  Smokeless Tobacco Never  Tobacco Comments   Pack of cigarettes lasts 2 days 09/18/2020   Ready to quit: Not Answered Counseling given: Not Answered Tobacco comments: Pack of cigarettes lasts 2 days 09/18/2020   Outpatient Encounter Medications as of 01/12/2022  Medication Sig   allopurinol (ZYLOPRIM) 100 MG tablet TAKE 1 TABLET BY MOUTH EVERY DAY   amiodarone (PACERONE) 200 MG tablet Take 100 mg by mouth daily.   B Complex-C-Zn-Folic Acid (DIALYVITE 161-WRUE 15) 0.8 MG TABS Take 0.8 mg by mouth daily.   Blood Pressure Monitoring (BLOOD PRESSURE CUFF) MISC 1 Units by Does not apply route once a week.   busPIRone (BUSPAR) 10 MG tablet Take 5 mg by mouth 2 (two) times daily.   Calcium Acetate, Phos Binder, (CALCIUM ACETATE PO) Take by mouth.   diphenhydrAMINE (BENADRYL) 25 mg capsule Take 1 capsule (25 mg total) by mouth every 8 (eight) hours as needed.   dorzolamide-timolol (COSOPT) 2-0.5 % ophthalmic solution Place 1 drop into the left eye 2 (two) times daily.   ELIQUIS 2.5 MG TABS tablet TAKE 1 TABLET BY MOUTH TWICE A DAY   escitalopram (LEXAPRO) 20 MG tablet Take 20 mg by mouth daily.   famotidine (PEPCID) 20 MG tablet Take 20 mg by mouth 2 (two) times daily.   fluticasone (FLONASE) 50 MCG/ACT nasal spray Place 1 spray into both nostrils daily as needed for allergies or rhinitis.   fluticasone-salmeterol (ADVAIR HFA) 115-21 MCG/ACT inhaler Inhale 2 puffs into the lungs 2 (two) times daily.   gabapentin (NEURONTIN) 100 MG capsule TAKE 1 CAPSULE BY MOUTH TWICE A DAY MAX 2 CAPSULES DAILY   lidocaine (LIDODERM) 5 % Place 1 patch onto the skin daily as needed (for pain- remove old patch first).    midodrine (PROAMATINE) 10 MG tablet Take 1.5 tablets (15 mg total) by mouth 3 (three) times daily.    omeprazole (PRILOSEC) 40 MG capsule TAKE 1 CAPSULE (40 MG TOTAL) BY MOUTH DAILY.   ondansetron (ZOFRAN-ODT) 4 MG disintegrating tablet Take 1 tablet (4 mg total) by mouth every 8 (eight) hours as needed for nausea or vomiting.   oxyCODONE-acetaminophen (PERCOCET) 10-325 MG tablet Take 1 tablet by mouth 4 (four) times daily as needed for pain.   Vitamin D, Ergocalciferol, (DRISDOL) 1.25 MG (50000 UNIT) CAPS capsule TAKE 1 CAPSULE (50,000 UNITS TOTAL) BY MOUTH EVERY 7 (SEVEN) DAYS   zolpidem (AMBIEN) 10 MG tablet Take 1 tablet (10 mg total) by mouth at bedtime as needed for sleep.   [DISCONTINUED] Colchicine 0.6 MG CAPS Take 0.6 mg by mouth daily as needed (gout flare up).   [DISCONTINUED] latanoprost (XALATAN) 0.005 % ophthalmic solution Place 1 drop into the left eye at bedtime.   [DISCONTINUED] metoCLOPramide (REGLAN) 5 MG tablet TAKE 1 TABLET (5 MG TOTAL) BY MOUTH 2 (TWO) TIMES DAILY BEFORE A  MEAL.   Colchicine 0.6 MG CAPS Take 0.6 mg by mouth daily as needed (gout flare up).   diclofenac Sodium (VOLTAREN) 1 % GEL Apply 4 g topically as needed. (Patient not taking: Reported on 01/12/2022)   Methoxy PEG-Epoetin Beta (MIRCERA IJ) Inject 1 Dose as directed See admin instructions. Unknown to pt   metoCLOPramide (REGLAN) 5 MG tablet Take 1 tablet (5 mg total) by mouth 2 (two) times daily before a meal.   No facility-administered encounter medications on file as of 01/12/2022.     Review of Systems  Review of Systems  Constitutional: Negative.   HENT: Negative.    Cardiovascular: Negative.   Gastrointestinal: Negative.   Allergic/Immunologic: Negative.   Neurological: Negative.   Psychiatric/Behavioral: Negative.         Physical Exam  BP (!) 82/48   Pulse 74   Ht '4\' 11"'$  (1.499 m)   Wt 116 lb (52.6 kg)   SpO2 98%   BMI 23.43 kg/m   Wt Readings from Last 5 Encounters:  01/12/22 116 lb (52.6 kg)  12/15/21 115 lb (52.2 kg)  11/24/21 115 lb 6.4 oz (52.3 kg)  10/01/21 112 lb  (50.8 kg)  09/26/21 110 lb 9.6 oz (50.2 kg)     Physical Exam Vitals and nursing note reviewed.  Constitutional:      General: She is not in acute distress.    Appearance: She is well-developed.  Cardiovascular:     Rate and Rhythm: Normal rate and regular rhythm.  Pulmonary:     Effort: Pulmonary effort is normal.     Breath sounds: Normal breath sounds.  Neurological:     Mental Status: She is alert and oriented to person, place, and time.         Assessment & Plan:   Hypotension - Blood Pressure Monitoring (BLOOD PRESSURE CUFF) MISC; 1 Units by Does not apply route once a week.  Dispense: 1 each; Refill: 0   Follow up:  Follow up in 6 months     Fenton Foy, NP 01/12/2022

## 2022-01-12 NOTE — Assessment & Plan Note (Signed)
-   Blood Pressure Monitoring (BLOOD PRESSURE CUFF) MISC; 1 Units by Does not apply route once a week.  Dispense: 1 each; Refill: 0   Follow up:  Follow up in 6 months

## 2022-01-12 NOTE — Patient Instructions (Addendum)
1. Hypotension, unspecified hypotension type  - Blood Pressure Monitoring (BLOOD PRESSURE CUFF) MISC; 1 Units by Does not apply route once a week.  Dispense: 1 each; Refill: 0   Follow up:  Follow up in 6 months

## 2022-01-14 ENCOUNTER — Encounter: Payer: Self-pay | Admitting: Internal Medicine

## 2022-01-14 ENCOUNTER — Ambulatory Visit: Payer: Medicare Other | Attending: Internal Medicine | Admitting: Internal Medicine

## 2022-01-14 ENCOUNTER — Ambulatory Visit: Payer: Medicare Other | Admitting: Physical Therapy

## 2022-01-14 VITALS — BP 90/49 | HR 56 | Resp 15 | Ht 59.0 in | Wt 116.2 lb

## 2022-01-14 DIAGNOSIS — M1A3720 Chronic gout due to renal impairment, left ankle and foot, without tophus (tophi): Secondary | ICD-10-CM | POA: Diagnosis present

## 2022-01-14 DIAGNOSIS — G8929 Other chronic pain: Secondary | ICD-10-CM | POA: Diagnosis present

## 2022-01-14 DIAGNOSIS — M25552 Pain in left hip: Secondary | ICD-10-CM | POA: Insufficient documentation

## 2022-01-14 DIAGNOSIS — M25551 Pain in right hip: Secondary | ICD-10-CM | POA: Diagnosis present

## 2022-01-14 DIAGNOSIS — N186 End stage renal disease: Secondary | ICD-10-CM | POA: Diagnosis present

## 2022-01-14 DIAGNOSIS — M25511 Pain in right shoulder: Secondary | ICD-10-CM | POA: Insufficient documentation

## 2022-01-14 DIAGNOSIS — M25512 Pain in left shoulder: Secondary | ICD-10-CM | POA: Insufficient documentation

## 2022-01-14 NOTE — Progress Notes (Signed)
Office Visit Note  Patient: Tina Watkins             Date of Birth: [birth year]           MRN: [id number]             PCP: Teena Dunk, NP Referring: Teena Dunk, NP Visit Date: 01/14/2022   Subjective:  No chief complaint on file.   History of Present Illness: Tina Watkins is a 56 y.o. female here for follow up for chronic joint pain with history of neuropathy, gout, osteoarthritis, and possible fibromyalgia syndrome. At last visit no new flare ups of inflammatory arthritis. We tried bilateral shoulder subacromial bursa steroid injections. She had some pain relief after the injection but did feel like the pain came back again similar as before.  She started physical therapy as referred by her orthopedist has had 5 or 6 sessions so far and is noticing an improvement with the shoulder range of motion and stiffness.  She followed up with them this morning with recommendation to continue therapy for now and plan follow-up in about February for repeat shoulder injection.  She has not had any new flareups of gout.  She is having some increase in lateral hip pain this is most noticeable when lying on the bed trying to sleep.  Rarely awakens her from sleep.  She is doing some stationary biking exercise with the physical therapist but no specific range of motion exercises for her hips.  Previous HPI 09/26/21 Tina Watkins is a 57 y.o. female here for follow up or evaluation of pain with history of neuropathy, gout, osteoarthritis, and possible fibromyalgia syndrome.  She is not experiencing any major flareups with the toe inflammation or swelling since her last visit.  Does still continue having considerable pain on a daily basis at multiple sites including lower extremities hands shoulders neck and back.  Bilateral shoulder pains were improved with the steroid injections at her last visit but has experienced worsening again with stiffness and difficulty in range of motion.    Previous HPI 07/07/2021 Tina Watkins is a 56 y.o. female here for follow up for evaluation of pain with history of neuropathy, gout, osteoarthritis, and possible fibromyalgia syndrome.  At initial visit uric acid level was 3.2 suspect gout is well controlled at this time. Imaging of hands and shoulders did not show significant shoulder arthritis but hands with chronic changes including probable small distal erosions. She continues to have a lot of pain also feeling numbness and swelling involving both hands. She is unable to easily reach up or behind her back, also with increased pain lying on her side provokes worse numbness to that side.   Previous HPI 06/06/2021 Tina Watkins is a 56 y.o. female here for evaluation of pain with history of neuropathy, gout, osteoarthritis, and possible fibromyalgia syndrome. 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 She has joint pain and stiffness in a lot of areas. Muscle pains in neck and upper back.  Large joint pains have been bothering her for years varying in severity.  Shoulders and back are doing worse particularly in the past month.  Episodic gout flares have caused severe pain in first MTP joints.  Most recently gout flare in the left great toe about a week ago improved with colchicine treatment.  However on a routine basis having more symptoms in the bilateral hands and shoulders.  She was started on duloxetine 30 mg for lower extremity neuropathy pain. On allopurinol 100 mg daily for gout. Previous knee xray and MRI findings with chondrocalcinosis and bone infarcts. Right hip imaging with AVN.   Review of Systems  Constitutional:  Positive for fatigue.  HENT:  Negative for mouth sores and mouth dryness.   Eyes:  Negative for dryness.  Respiratory:  Positive for shortness of breath.   Cardiovascular:  Positive for chest pain and palpitations.  Gastrointestinal:  Negative for  blood in stool, constipation and diarrhea.  Endocrine: Negative for increased urination.  Genitourinary:  Negative for involuntary urination.  Musculoskeletal:  Positive for joint pain, joint pain, myalgias, morning stiffness, muscle tenderness and myalgias. Negative for gait problem, joint swelling and muscle weakness.  Skin:  Negative for color change, rash, hair loss and sensitivity to sunlight.  Allergic/Immunologic: Negative for susceptible to infections.  Neurological:  Positive for headaches. Negative for dizziness.  Hematological:  Negative for swollen glands.  Psychiatric/Behavioral:  Positive for depressed mood. Negative for sleep disturbance. The patient is nervous/anxious.     PMFS History:  Patient Active Problem List   Diagnosis Date Noted   Bilateral hip pain 01/14/2022   Prolonged Q-T interval on ECG 09/07/2021   Nausea & vomiting 09/06/2021   Bilateral shoulder pain 06/06/2021   Bilateral hand swelling 06/06/2021   Cervical radiculitis 01/24/2021   Acute respiratory failure with hypoxia ([place]) 10/23/2020   Coagulation defect, unspecified ([place]) 08/01/2020   Aortic atherosclerosis ([place]) 07/10/2020   Secondary hypercoagulable state ([place]) 04/23/2020   Atrial flutter ([place]) 03/29/2020   Chronic pain of both knees 03/13/2020   Chills (without fever) 12/12/2019   Other pancytopenia ([place])    Decubitus ulcer of sacral region, stage 1 08/18/2019   HCAP (healthcare-associated pneumonia) 08/05/2019   Pressure injury of skin 07/24/2019   Chest congestion 07/24/2019   Itching 07/24/2019   Weakness 06/13/2019   Pneumonia due to COVID-19 virus 05/09/2019   Hypotension 05/09/2019   Symptomatic anemia 05/09/2019   CHF (congestive heart failure) ([place]) 05/09/2019   Swollen abdomen 01/04/2019   DDD (degenerative disc disease), cervical 12/06/2018   Neuropathy 12/06/2018   Paresthesia 10/11/2018   Neck pain 10/11/2018   Tobacco abuse 11/20/2017   Right leg swelling 11/20/2017    Right leg pain 11/20/2017   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   GERD (gastroesophageal reflux disease) 11/20/2017   Acute venous embolism and thrombosis of deep vessels of proximal lower extremity, right ([place]) 11/20/2017   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    Thrush, oral    AKI (acute kidney injury) ([place])    Multifocal pneumonia 08/09/2017   ETD (Eustachian tube dysfunction), bilateral 08/03/2017   Acute recurrent pansinusitis 07/21/2017   Chest pain 09/29/2015   Cervical stenosis of spinal canal 01/08/2015   Urinary tract infectious disease    Muscle spasms of neck 06/15/2014   Bleeding hemorrhoid 06/15/2014   Anemia in chronic kidney disease 06/15/2014   Pyelonephritis, acute 06/13/2014   Sepsis ([place]) 06/13/2014   History of kidney transplant    Abnormal CT scan 09/14/2012   Chronic pain syndrome 04/09/2012   Dehydration, mild 04/09/2012   Herpes infection 06/23/2011   Anxiety 06/23/2011   E coli bacteremia 06/18/2011   ESRD (end stage renal disease) ([place]) 06/12/2011   UTI (lower urinary tract infection) 06/12/2011   Bacteremia due to Gram-negative bacteria 05/23/2011   History of renal transplantation 05/22/2011   Septic shock(785.52) 05/22/2011  Acute on chronic kidney failure ([place]) 05/22/2011   Gastroparesis 04/24/2008   WEIGHT LOSS 08/24/2007   NAUSEA AND VOMITING 08/24/2007    Past Medical History:  Diagnosis Date   Bacteremia due to Gram-negative bacteria 05/23/2011   Blind    right eye   Blind right eye    CHF (congestive heart failure) ([place])    Chronic lower back pain    Complication of anesthesia    "woke up during OR; I have an extremely high tolerance" (12/11/2011) 1 procedure was graft; the other procedures were procedures that are typically done with sedation.   DDD (degenerative disc disease), cervical    Depression    Dysrhythmia    "tachycardia" (12/11/2011) new onset afib 10/15/14 EKG   E coli bacteremia  06/18/2011   Elevated LDL cholesterol level 12/2018   ESRD (end stage renal disease) ([place]) 06/12/2011   Fibromyalgia    Gastroparesis    Gastroparesis    GERD (gastroesophageal reflux disease)    Glaucoma    right eye   Gout    H/O carpal tunnel syndrome    Headache 10/2019   Headache(784.0)    "not often anymore" (12/11/2011)   Herpes genitalia 1994   History of blood transfusion    "more than a few times" (12/11/2011)   History of stomach ulcers    Hypotension    Iron deficiency anemia    New onset a-fib ([place])    10/15/14 EKG   Osteopenia    Peripheral neuropathy 11/2018   Pressure ulcer of sacral region, stage 1 07/2019   Seizures ([place]) 1994   "post transplant; only have had that one" (12/11/2011)   Spinal stenosis in cervical region    Stroke [facility])     left basal ganglia lacunar infarct; Right frontal lobe lacunar infarct.   Stroke [facility]) ~ 1999; 2001   "briefly lost my vision; lost my right eye" (12/11/2011)   Vitamin D deficiency 12/2018    Family History  Problem Relation Age of Onset   Glaucoma Mother    Pancreatic cancer Father    Multiple sclerosis Brother    Diabetes Maternal Uncle    Hypertension Maternal Grandmother    Breast cancer Neg Hx    Past Surgical History:  Procedure Laterality Date   ANTERIOR CERVICAL DECOMP/DISCECTOMY FUSION N/A 01/08/2015   Procedure: Anterior Cervical Three-Four/Four-Five Decompression/Diskectomy/Fusion;  Surgeon: Leeroy Cha, MD;  Location: [facility];  Service: Neurosurgery;  Laterality: N/A;  C3-4 C4-5 Anterior cervical decompression/diskectomy/fusion   APPENDECTOMY  ~ 2004   BIOPSY  09/12/2020   Procedure: BIOPSY;  Surgeon: Milus Banister, MD;  Location: [facility];  Service: Endoscopy;;   CATARACT EXTRACTION     right eye   COLONOSCOPY     COLONOSCOPY WITH PROPOFOL N/A 09/12/2020   Procedure: COLONOSCOPY WITH PROPOFOL;  Surgeon: Milus Banister, MD;  Location: [facility];  Service: Endoscopy;  Laterality: N/A;    ENUCLEATION  2001   "right"   ESOPHAGOGASTRODUODENOSCOPY (EGD) WITH PROPOFOL N/A 04/21/2012   Procedure: ESOPHAGOGASTRODUODENOSCOPY (EGD) WITH PROPOFOL;  Surgeon: Milus Banister, MD;  Location: [facility];  Service: Endoscopy;  Laterality: N/A;   ESOPHAGOGASTRODUODENOSCOPY (EGD) WITH PROPOFOL N/A 09/12/2020   Procedure: ESOPHAGOGASTRODUODENOSCOPY (EGD) WITH PROPOFOL;  Surgeon: Milus Banister, MD;  Location: [facility];  Service: Endoscopy;  Laterality: N/A;   INSERTION OF DIALYSIS CATHETER  1988   "AV graft LUA & LFA; LUA worked for 1 day; LFA never worked"   IR FLUORO GUIDE CV LINE  RIGHT  07/11/2019   IR FLUORO GUIDE CV LINE RIGHT  10/20/2019   IR FLUORO GUIDE CV LINE RIGHT  05/31/2020   IR PTA VENOUS EXCEPT DIALYSIS CIRCUIT  05/31/2020   IR RADIOLOGY PERIPHERAL GUIDED IV START  07/11/2019   IR US GUIDE VASC ACCESS RIGHT  07/11/2019   IR US GUIDE VASC ACCESS RIGHT  07/11/2019   IR US GUIDE VASC ACCESS RIGHT  07/11/2019   IR VENOCAVAGRAM IVC  05/31/2020   KIDNEY TRANSPLANT  1994; 1999; 2005   "right"   MULTIPLE TOOTH EXTRACTIONS     POLYPECTOMY  09/12/2020   Procedure: POLYPECTOMY;  Surgeon: Milus Banister, MD;  Location: [facility];  Service: Endoscopy;;   RIGHT HEART CATH N/A 03/23/2019   Procedure: RIGHT HEART CATH;  Surgeon: Jolaine Artist, MD;  Location: [facility];  Service: Cardiovascular;  Laterality: N/A;   [place]?; 1994; 2005   Social History   Social History Narrative   Right-handed.   Four cups caffeine per day.   Lives alone.   Immunization History  Administered Date(s) Administered   Hepatitis B, PED/ADOLESCENT 07/20/2019, 08/29/2019, 09/21/2019   Hepb-cpg 07/20/2019, 08/29/2019, 09/21/2019, 11/23/2019   Influenza Split 11/07/2019   Influenza,inj,Quad PF,6+ Mos 11/07/2019, 11/07/2020   PFIZER Comirnaty(Gray Top)Covid-19 Tri-Sucrose Vaccine 07/29/2020   PFIZER(Purple Top)SARS-COV-2 Vaccination 05/31/2019, 06/21/2019,  10/26/2019   PPD Test 08/10/2017   Pfizer Covid-19 Vaccine Bivalent Booster 28yr & up 02/06/2021     Objective: Vital Signs: BP (!) 90/49 (BP Location: Left Arm, Patient Position: Sitting, Cuff Size: Normal)   Pulse (!) 56   Resp 15   Ht '4\' 11"'$  (1.499 m)   Wt 116 lb 3.2 oz (52.7 kg)   BMI 23.47 kg/m    Physical Exam Eyes:     Comments: Right eye blindness  Cardiovascular:     Rate and Rhythm: Normal rate and regular rhythm.  Pulmonary:     Effort: Pulmonary effort is normal.     Breath sounds: Normal breath sounds.  Musculoskeletal:     Right lower leg: No edema.     Left lower leg: No edema.  Skin:    General: Skin is warm and dry.     Findings: Rash present.     Comments: Hyperpigmentation and skin thickening over shins and calves bilaterally  Neurological:     Mental Status: She is alert.  Psychiatric:        Mood and Affect: Mood normal.      Musculoskeletal Exam:  Shoulders with mild tenderness to pressure on the lateral and posterior sides of the joints without appreciable swelling, abduction range of motion is restricted but achieves above horizontal active range of motion Elbows full ROM no tenderness or swelling Wrists full ROM no tenderness or swelling Fingers full ROM first IP joint widening distal phalanx shortening with lateral deviation on the right thumb, left thumb with some triggering no palpable swelling Mild tenderness to pressure at the lateral hip on both sides just below the level of the iliac crest, some right groin pain provoked with internal rotation Knees full ROM no tenderness or swelling Ankles with decreased range of motion in all planes no tenderness or swelling  Investigation: No additional findings.  Imaging: No results found.  Recent Labs: Lab Results  Component Value Date   WBC 4.9 10/10/2021   HGB 10.6 (L) 10/10/2021   PLT 172 10/10/2021   NA 139 10/10/2021   K 4.0  10/10/2021   CL 100 10/10/2021   CO2 21 (L) 10/10/2021    GLUCOSE 71 10/10/2021   BUN 34 (H) 10/10/2021   CREATININE 5.16 (H) 10/10/2021   BILITOT 0.8 10/10/2021   ALKPHOS 140 (H) 10/10/2021   AST 26 10/10/2021   ALT 25 10/10/2021   PROT 7.2 10/10/2021   ALBUMIN 4.4 10/10/2021   CALCIUM 10.6 (H) 10/10/2021   GFRAA 13 (L) 10/13/2019    Speciality Comments: No specialty comments available.  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  Previous to current uric acid very well at goal unclear if gout versus alternate inflammatory process but with no new flareups will monitor without additional adjustments at this time.  Agree with continuing allopurinol 100 mg daily.  Chronic pain of both shoulders  Shoulder mobility is definitely better today than at our last exam although she still complains about joint pain.  No appreciable inflammatory changes.  Agree with continuing physical therapy as directed by her orthopedist.  I do not think there is a high utility in going for advanced imaging at this time.  Bilateral hip pain  Lateral hip pain in the tender area on exam more suggestive for gluteus medius or other lateral hip structure tenderness.  No focal tenderness over the trochanteric bursa.  Symptoms are pretty mild and not limiting her severely provided some printed home range of motion and strengthening exercises for the lateral hip.  Orders: No orders of the defined types were placed in this encounter.  No orders of the defined types were placed in this encounter.    Follow-Up Instructions: Return in about 6 months (around 07/16/2022) for Gout/OA/tendonitis f/u 71mo.   CCollier Salina MD  Note - This record has been created using [employer]  Chart creation errors have been sought, but may not  always  have been located. Such creation errors do not reflect on  the standard of medical care.

## 2022-01-16 ENCOUNTER — Ambulatory Visit: Payer: Medicaid Other | Admitting: Nurse Practitioner

## 2022-01-16 ENCOUNTER — Ambulatory Visit: Payer: Medicare Other | Admitting: Physical Therapy

## 2022-01-19 ENCOUNTER — Ambulatory Visit: Payer: Medicare Other | Admitting: Physical Therapy

## 2022-01-19 ENCOUNTER — Encounter: Payer: Self-pay | Admitting: Physical Therapy

## 2022-01-19 DIAGNOSIS — M25512 Pain in left shoulder: Secondary | ICD-10-CM | POA: Diagnosis not present

## 2022-01-19 DIAGNOSIS — M25612 Stiffness of left shoulder, not elsewhere classified: Secondary | ICD-10-CM

## 2022-01-19 DIAGNOSIS — G8929 Other chronic pain: Secondary | ICD-10-CM

## 2022-01-19 DIAGNOSIS — M6281 Muscle weakness (generalized): Secondary | ICD-10-CM

## 2022-01-19 DIAGNOSIS — M25611 Stiffness of right shoulder, not elsewhere classified: Secondary | ICD-10-CM

## 2022-01-19 NOTE — Therapy (Signed)
OUTPATIENT PHYSICAL THERAPY TREATMENT NOTE   Patient Name: Tina Watkins MRN: [id number] DOB:Jul 26, 1965, 56 y.o., female Today's Date: 01/19/2022  PCP: Bo Merino I, NP   REFERRING PROVIDER: Cleophas Dunker, *   PT End of Session - 01/19/22 0748     Visit Number 5    Date for PT Re-Evaluation 02/04/22    Authorization Type MEDICARE PART A AND B; MEDICAID [place] ACCESS    PT Start Time [phone]    PT Stop Time 0827    PT Time Calculation (min) 40 min    Activity Tolerance Patient tolerated treatment well;Patient limited by pain    Behavior During Therapy [facility] for tasks assessed/performed              Past Medical History:  Diagnosis Date   Bacteremia due to Gram-negative bacteria 05/23/2011   Blind    right eye   Blind right eye    CHF (congestive heart failure) ([place])    Chronic lower back pain    Complication of anesthesia    "woke up during OR; I have an extremely high tolerance" (12/11/2011) 1 procedure was graft; the other procedures were procedures that are typically done with sedation.   DDD (degenerative disc disease), cervical    Depression    Dysrhythmia    "tachycardia" (12/11/2011) new onset afib 10/15/14 EKG   E coli bacteremia 06/18/2011   Elevated LDL cholesterol level 12/2018   ESRD (end stage renal disease) ([place]) 06/12/2011   Fibromyalgia    Gastroparesis    Gastroparesis    GERD (gastroesophageal reflux disease)    Glaucoma    right eye   Gout    H/O carpal tunnel syndrome    Headache 10/2019   Headache(784.0)    "not often anymore" (12/11/2011)   Herpes genitalia 1994   History of blood transfusion    "more than a few times" (12/11/2011)   History of stomach ulcers    Hypotension    Iron deficiency anemia    New onset a-fib ([place])    10/15/14 EKG   Osteopenia    Peripheral neuropathy 11/2018   Pressure ulcer of sacral region, stage 1 07/2019   Seizures ([place]) 1994   "post transplant; only have had that one" (12/11/2011)    Spinal stenosis in cervical region    Stroke [facility])     left basal ganglia lacunar infarct; Right frontal lobe lacunar infarct.   Stroke [facility]) ~ 1999; 2001   "briefly lost my vision; lost my right eye" (12/11/2011)   Vitamin D deficiency 12/2018   Past Surgical History:  Procedure Laterality Date   ANTERIOR CERVICAL DECOMP/DISCECTOMY FUSION N/A 01/08/2015   Procedure: Anterior Cervical Three-Four/Four-Five Decompression/Diskectomy/Fusion;  Surgeon: Leeroy Cha, MD;  Location: [place] NEURO ORS;  Service: Neurosurgery;  Laterality: N/A;  C3-4 C4-5 Anterior cervical decompression/diskectomy/fusion   APPENDECTOMY  ~ 2004   BIOPSY  09/12/2020   Procedure: BIOPSY;  Surgeon: Milus Banister, MD;  [facility];  Service: Endoscopy;;   CATARACT EXTRACTION     right eye   COLONOSCOPY     COLONOSCOPY WITH PROPOFOL N/A 09/12/2020   Procedure: COLONOSCOPY WITH PROPOFOL;  Surgeon: Milus Banister, MD;  [facility];  Service: Endoscopy;  Laterality: N/A;   ENUCLEATION  2001   "right"   ESOPHAGOGASTRODUODENOSCOPY (EGD) WITH PROPOFOL N/A 04/21/2012   Procedure: ESOPHAGOGASTRODUODENOSCOPY (EGD) WITH PROPOFOL;  Surgeon: Milus Banister, MD;  [facility];  Service: Endoscopy;  Laterality: N/A;   ESOPHAGOGASTRODUODENOSCOPY (EGD)  WITH PROPOFOL N/A 09/12/2020   Procedure: ESOPHAGOGASTRODUODENOSCOPY (EGD) WITH PROPOFOL;  Surgeon: Milus Banister, MD;  [facility];  Service: Endoscopy;  Laterality: N/A;   INSERTION OF DIALYSIS CATHETER  1988   "AV graft LUA & LFA; LUA worked for 1 day; LFA never worked"   IR FLUORO GUIDE CV LINE RIGHT  07/11/2019   IR FLUORO GUIDE CV LINE RIGHT  10/20/2019   IR FLUORO GUIDE CV LINE RIGHT  05/31/2020   IR PTA VENOUS EXCEPT DIALYSIS CIRCUIT  05/31/2020   IR RADIOLOGY PERIPHERAL GUIDED IV START  07/11/2019   IR US GUIDE VASC ACCESS RIGHT  07/11/2019   IR US GUIDE VASC ACCESS RIGHT  07/11/2019   IR US GUIDE VASC ACCESS RIGHT  07/11/2019   IR  VENOCAVAGRAM IVC  05/31/2020   KIDNEY TRANSPLANT  1994; 1999; 2005   "right"   MULTIPLE TOOTH EXTRACTIONS     POLYPECTOMY  09/12/2020   Procedure: POLYPECTOMY;  Surgeon: Milus Banister, MD;  [facility];  Service: Endoscopy;;   RIGHT HEART CATH N/A 03/23/2019   Procedure: RIGHT HEART CATH;  Surgeon: Jolaine Artist, MD;  Location: [place] CV LAB;  Service: Cardiovascular;  Laterality: N/A;   [place]?; 1994; 2005   Patient Active Problem List   Diagnosis Date Noted   Bilateral hip pain 01/14/2022   Prolonged Q-T interval on ECG 09/07/2021   Nausea & vomiting 09/06/2021   Bilateral shoulder pain 06/06/2021   Bilateral hand swelling 06/06/2021   Cervical radiculitis 01/24/2021   Acute respiratory failure with hypoxia ([place]) 10/23/2020   Coagulation defect, unspecified ([place]) 08/01/2020   Aortic atherosclerosis ([place]) 07/10/2020   Secondary hypercoagulable state ([place]) 04/23/2020   Atrial flutter ([place]) 03/29/2020   Chronic pain of both knees 03/13/2020   Chills (without fever) 12/12/2019   Other pancytopenia ([place])    Decubitus ulcer of sacral region, stage 1 08/18/2019   HCAP (healthcare-associated pneumonia) 08/05/2019   Pressure injury of skin 07/24/2019   Chest congestion 07/24/2019   Itching 07/24/2019   Weakness 06/13/2019   Pneumonia due to COVID-19 virus 05/09/2019   Hypotension 05/09/2019   Symptomatic anemia 05/09/2019   CHF (congestive heart failure) ([place]) 05/09/2019   Swollen abdomen 01/04/2019   DDD (degenerative disc disease), cervical 12/06/2018   Neuropathy 12/06/2018   Paresthesia 10/11/2018   Neck pain 10/11/2018   Tobacco abuse 11/20/2017   Right leg swelling 11/20/2017   Right leg pain 11/20/2017   Stroke (cerebrum) ([place]) 11/20/2017   GERD (gastroesophageal reflux disease) 11/20/2017   Acute venous embolism and thrombosis of deep vessels of proximal lower extremity, right ([place]) 11/20/2017   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    Thrush, oral    AKI (acute kidney injury) ([place])    Multifocal pneumonia 08/09/2017   ETD (Eustachian tube dysfunction), bilateral 08/03/2017   Acute recurrent pansinusitis 07/21/2017   Chest pain 09/29/2015   Cervical stenosis of spinal canal 01/08/2015   Urinary tract infectious disease    Muscle spasms of neck 06/15/2014   Bleeding hemorrhoid 06/15/2014   Anemia in chronic kidney disease 06/15/2014   Pyelonephritis, acute 06/13/2014   Sepsis ([place]) 06/13/2014   History of kidney transplant    Abnormal CT scan 09/14/2012   Chronic pain syndrome 04/09/2012   Dehydration, mild 04/09/2012   Herpes infection 06/23/2011   Anxiety 06/23/2011   E coli bacteremia 06/18/2011   ESRD (  end stage renal disease) ([place]) 06/12/2011   UTI (lower urinary tract infection) 06/12/2011   Bacteremia due to Gram-negative bacteria 05/23/2011   History of renal transplantation 05/22/2011   Septic shock(785.52) 05/22/2011   Acute on chronic kidney failure ([place]) 05/22/2011   Gastroparesis 04/24/2008   WEIGHT LOSS 08/24/2007   NAUSEA AND VOMITING 08/24/2007    THERAPY DIAG:  Chronic left shoulder pain  Chronic right shoulder pain  Muscle weakness (generalized)  Stiffness of left shoulder, not elsewhere classified  Stiffness of right shoulder, not elsewhere classified   Rationale for Evaluation and Treatment Rehabilitation  REFERRING DIAG:  Bil shoulder adhesive capuslitis   PERTINENT HISTORY: Hx of stroke, DVT, CHF, Afib, Neuropathy, neck pain, gout, DDD, ESRD, cervical stenosis, dialysis (T, Th, sat), kidney transplant (failed)      PRECAUTIONS/RESTRICTIONS:   see pertinent history   SUBJECTIVE:  Patient reports that her pain is "up and down" she has some mild pain today but overall is feeling good.  Pain:  Are you having pain? Yes Pain location: diffuse shoulder pain R>L NPRS scale:  current 3/10  Aggravating factors:  reaching, lifting, activity Relieving factors: rest Pain description: constant and aching Stage: Chronic 24 hour pattern: worse with activity   OBJECTIVE: (objective measures completed at initial evaluation unless otherwise dated)  ASTERISK SIGNS     Asterisk Signs Eval (12/10/2021)  1/17  11/29        Functional ER To ear bil            Flexion  R 6.2 lbs, L 7.5 lbs            Comb hair unable   unable          flexion   L 90 R 100                            TREATMENT 12/18:  Therapeutic Exercise: - nu-step - L5 - 4' - 200' steps - Shoulder pulley - chest press (reclined) - 0# - 3x10 - Low row - 3x10 - 10#  Manual Therapy: - ER and flexion ROM with gentle oscillations at end range  10 min heat following session   TREATMENT 11/29:  Therapeutic Exercise: - nu-step - L5 - 3' - 100 steps - dowel chest press (reclined) - 3x10 - Low row - 3x10 - 10# - high row - 3x10 - 5#  Manual Therapy: - ER and flexion ROM with gentle oscillations at end range  10 min heat following session  ASSESSMENT:   CLINICAL IMPRESSION: Pt remains extremely limited d/t high levels of pain.  Her shoulder ROM does improve with manual and she states she feels looser as well.  She is unable to tolerate an increase in load with strengthening secondary to pain.  OBJECTIVE IMPAIRMENTS: Pain, shoulder ROM, shoulder strength   ACTIVITY LIMITATIONS: housework, lifting, reaching   PERSONAL FACTORS: See medical history and pertinent history     REHAB POTENTIAL: Fair chronic, recurring, complex medical history   CLINICAL DECISION MAKING: Stable/uncomplicated   EVALUATION COMPLEXITY: Low     GOALS:     SHORT TERM GOALS: Target date: 01/07/2022   Janeann will be >75% HEP compliant to improve carryover between sessions and facilitate independent management of condition   Evaluation (12/10/2021): ongoing Goal status: MET 01/07/22: Pt reports adherence      LONG TERM GOALS: Target date:  02/04/2022   Caitlynne will show a >/= 15 pt improvement  in her QUICK DASH score (MCID is 13.4 pts or 8%) as a proxy for functional improvement    Evaluation/Baseline (12/10/2021): 29 pts Goal status: INITIAL     2.  Joniqua will be able to comb her hair, not limited by pain or ROM   Evaluation/Baseline (12/10/2021): limited Goal status: INITIAL     3.  Billye will be able to reach to low shelf, not limited by pain or ROM   Evaluation/Baseline (12/10/2021): limited Goal status: INITIAL     4.  Matty will demonstrate >110 degrees of active ROM in flexion to allow completion of activities involving reaching [place], not limited by pain   Evaluation/Baseline (12/10/2021):    UPPER EXTREMITY AROM:   AROM/PROM Right 12/10/2021 Left 12/10/2021  Shoulder flexion 90/100 80/90  Shoulder abduction 70 70  Shoulder internal rotation      Shoulder external rotation      Functional IR PSIS PSIS  Functional ER ear ear  Shoulder extension      Elbow extension      Elbow flexion        (Blank rows = not tested, N = WNL, * = concordant pain with testing)   Goal status: INITIAL       PLAN: PT FREQUENCY: 1-2x/week   PT DURATION: 8 weeks (Ending 02/04/2022)   PLANNED INTERVENTIONS: Therapeutic exercises, Aquatic therapy, Therapeutic activity, Neuro Muscular re-education, Gait training, Patient/Family education, Joint mobilization, Dry Needling, Electrical stimulation, Spinal mobilization and/or manipulation, Moist heat, Taping, Vasopneumatic device, Ionotophoresis 73m/ml Dexamethasone, and Manual therapy   PLAN FOR NEXT SESSION: progressive shoulder strengthening, MT for bil shoulder capsules, ROM progression   KKevan NyReinhartsen PT 01/19/2022, 9:14 AM

## 2022-01-21 ENCOUNTER — Encounter: Payer: Self-pay | Admitting: Physical Therapy

## 2022-01-21 ENCOUNTER — Ambulatory Visit: Payer: Medicare Other | Admitting: Physical Therapy

## 2022-01-21 DIAGNOSIS — M25512 Pain in left shoulder: Secondary | ICD-10-CM | POA: Diagnosis not present

## 2022-01-21 DIAGNOSIS — G8929 Other chronic pain: Secondary | ICD-10-CM

## 2022-01-21 NOTE — Therapy (Signed)
OUTPATIENT PHYSICAL THERAPY TREATMENT NOTE   Patient Name: Tina Watkins MRN: [id number] DOB:1965/10/11, 56 y.o., female Today's Date: 01/21/2022  PCP: Bo Merino I, NP   REFERRING PROVIDER: Cleophas Dunker, *   PT End of Session - 01/21/22 [phone]     Visit Number 6    Date for PT Re-Evaluation 02/04/22    Authorization Type MEDICARE PART A AND B; MEDICAID [place] ACCESS    PT Start Time 0915    PT Stop Time 0956    PT Time Calculation (min) 41 min    Activity Tolerance Patient tolerated treatment well;Patient limited by pain    Behavior During Therapy [facility] for tasks assessed/performed              Past Medical History:  Diagnosis Date   Bacteremia due to Gram-negative bacteria 05/23/2011   Blind    right eye   Blind right eye    CHF (congestive heart failure) ([place])    Chronic lower back pain    Complication of anesthesia    "woke up during OR; I have an extremely high tolerance" (12/11/2011) 1 procedure was graft; the other procedures were procedures that are typically done with sedation.   DDD (degenerative disc disease), cervical    Depression    Dysrhythmia    "tachycardia" (12/11/2011) new onset afib 10/15/14 EKG   E coli bacteremia 06/18/2011   Elevated LDL cholesterol level 12/2018   ESRD (end stage renal disease) ([place]) 06/12/2011   Fibromyalgia    Gastroparesis    Gastroparesis    GERD (gastroesophageal reflux disease)    Glaucoma    right eye   Gout    H/O carpal tunnel syndrome    Headache 10/2019   Headache(784.0)    "not often anymore" (12/11/2011)   Herpes genitalia 1994   History of blood transfusion    "more than a few times" (12/11/2011)   History of stomach ulcers    Hypotension    Iron deficiency anemia    New onset a-fib ([place])    10/15/14 EKG   Osteopenia    Peripheral neuropathy 11/2018   Pressure ulcer of sacral region, stage 1 07/2019   Seizures ([place]) 1994   "post transplant; only have had that one" (12/11/2011)    Spinal stenosis in cervical region    Stroke [facility])     left basal ganglia lacunar infarct; Right frontal lobe lacunar infarct.   Stroke [facility]) ~ 1999; 2001   "briefly lost my vision; lost my right eye" (12/11/2011)   Vitamin D deficiency 12/2018   Past Surgical History:  Procedure Laterality Date   ANTERIOR CERVICAL DECOMP/DISCECTOMY FUSION N/A 01/08/2015   Procedure: Anterior Cervical Three-Four/Four-Five Decompression/Diskectomy/Fusion;  Surgeon: Leeroy Cha, MD;  Location: [place] NEURO ORS;  Service: Neurosurgery;  Laterality: N/A;  C3-4 C4-5 Anterior cervical decompression/diskectomy/fusion   APPENDECTOMY  ~ 2004   BIOPSY  09/12/2020   Procedure: BIOPSY;  Surgeon: Milus Banister, MD;  [facility];  Service: Endoscopy;;   CATARACT EXTRACTION     right eye   COLONOSCOPY     COLONOSCOPY WITH PROPOFOL N/A 09/12/2020   Procedure: COLONOSCOPY WITH PROPOFOL;  Surgeon: Milus Banister, MD;  [facility];  Service: Endoscopy;  Laterality: N/A;   ENUCLEATION  2001   "right"   ESOPHAGOGASTRODUODENOSCOPY (EGD) WITH PROPOFOL N/A 04/21/2012   Procedure: ESOPHAGOGASTRODUODENOSCOPY (EGD) WITH PROPOFOL;  Surgeon: Milus Banister, MD;  [facility];  Service: Endoscopy;  Laterality: N/A;   ESOPHAGOGASTRODUODENOSCOPY (EGD)  WITH PROPOFOL N/A 09/12/2020   Procedure: ESOPHAGOGASTRODUODENOSCOPY (EGD) WITH PROPOFOL;  Surgeon: Milus Banister, MD;  [facility];  Service: Endoscopy;  Laterality: N/A;   INSERTION OF DIALYSIS CATHETER  1988   "AV graft LUA & LFA; LUA worked for 1 day; LFA never worked"   IR FLUORO GUIDE CV LINE RIGHT  07/11/2019   IR FLUORO GUIDE CV LINE RIGHT  10/20/2019   IR FLUORO GUIDE CV LINE RIGHT  05/31/2020   IR PTA VENOUS EXCEPT DIALYSIS CIRCUIT  05/31/2020   IR RADIOLOGY PERIPHERAL GUIDED IV START  07/11/2019   IR US GUIDE VASC ACCESS RIGHT  07/11/2019   IR US GUIDE VASC ACCESS RIGHT  07/11/2019   IR US GUIDE VASC ACCESS RIGHT  07/11/2019   IR  VENOCAVAGRAM IVC  05/31/2020   KIDNEY TRANSPLANT  1994; 1999; 2005   "right"   MULTIPLE TOOTH EXTRACTIONS     POLYPECTOMY  09/12/2020   Procedure: POLYPECTOMY;  Surgeon: Milus Banister, MD;  [facility];  Service: Endoscopy;;   RIGHT HEART CATH N/A 03/23/2019   Procedure: RIGHT HEART CATH;  Surgeon: Jolaine Artist, MD;  Location: [place] CV LAB;  Service: Cardiovascular;  Laterality: N/A;   [place]?; 1994; 2005   Patient Active Problem List   Diagnosis Date Noted   Bilateral hip pain 01/14/2022   Prolonged Q-T interval on ECG 09/07/2021   Nausea & vomiting 09/06/2021   Bilateral shoulder pain 06/06/2021   Bilateral hand swelling 06/06/2021   Cervical radiculitis 01/24/2021   Acute respiratory failure with hypoxia ([place]) 10/23/2020   Coagulation defect, unspecified ([place]) 08/01/2020   Aortic atherosclerosis ([place]) 07/10/2020   Secondary hypercoagulable state ([place]) 04/23/2020   Atrial flutter ([place]) 03/29/2020   Chronic pain of both knees 03/13/2020   Chills (without fever) 12/12/2019   Other pancytopenia ([place])    Decubitus ulcer of sacral region, stage 1 08/18/2019   HCAP (healthcare-associated pneumonia) 08/05/2019   Pressure injury of skin 07/24/2019   Chest congestion 07/24/2019   Itching 07/24/2019   Weakness 06/13/2019   Pneumonia due to COVID-19 virus 05/09/2019   Hypotension 05/09/2019   Symptomatic anemia 05/09/2019   CHF (congestive heart failure) ([place]) 05/09/2019   Swollen abdomen 01/04/2019   DDD (degenerative disc disease), cervical 12/06/2018   Neuropathy 12/06/2018   Paresthesia 10/11/2018   Neck pain 10/11/2018   Tobacco abuse 11/20/2017   Right leg swelling 11/20/2017   Right leg pain 11/20/2017   Stroke (cerebrum) ([place]) 11/20/2017   GERD (gastroesophageal reflux disease) 11/20/2017   Acute venous embolism and thrombosis of deep vessels of proximal lower extremity, right ([place]) 11/20/2017   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    Thrush, oral    AKI (acute kidney injury) ([place])    Multifocal pneumonia 08/09/2017   ETD (Eustachian tube dysfunction), bilateral 08/03/2017   Acute recurrent pansinusitis 07/21/2017   Chest pain 09/29/2015   Cervical stenosis of spinal canal 01/08/2015   Urinary tract infectious disease    Muscle spasms of neck 06/15/2014   Bleeding hemorrhoid 06/15/2014   Anemia in chronic kidney disease 06/15/2014   Pyelonephritis, acute 06/13/2014   Sepsis ([place]) 06/13/2014   History of kidney transplant    Abnormal CT scan 09/14/2012   Chronic pain syndrome 04/09/2012   Dehydration, mild 04/09/2012   Herpes infection 06/23/2011   Anxiety 06/23/2011   E coli bacteremia 06/18/2011   ESRD (  end stage renal disease) ([place]) 06/12/2011   UTI (lower urinary tract infection) 06/12/2011   Bacteremia due to Gram-negative bacteria 05/23/2011   History of renal transplantation 05/22/2011   Septic shock(785.52) 05/22/2011   Acute on chronic kidney failure ([place]) 05/22/2011   Gastroparesis 04/24/2008   WEIGHT LOSS 08/24/2007   NAUSEA AND VOMITING 08/24/2007    THERAPY DIAG:  Chronic left shoulder pain  Chronic right shoulder pain   Rationale for Evaluation and Treatment Rehabilitation  REFERRING DIAG:  Bil shoulder adhesive capuslitis   PERTINENT HISTORY: Hx of stroke, DVT, CHF, Afib, Neuropathy, neck pain, gout, DDD, ESRD, cervical stenosis, dialysis (T, Th, sat), kidney transplant (failed)      PRECAUTIONS/RESTRICTIONS:   see pertinent history   SUBJECTIVE:  Patient reports that her pain is "up and down" she has some mild pain today but overall is feeling good.  Pain:  Are you having pain? Yes Pain location: diffuse shoulder pain R>L NPRS scale:  current 3/10  Aggravating factors: reaching, lifting, activity Relieving factors: rest Pain description: constant and aching Stage: Chronic 24 hour pattern: worse with activity    OBJECTIVE: (objective measures completed at initial evaluation unless otherwise dated)  ASTERISK SIGNS     Asterisk Signs Eval (12/10/2021)  1/17  11/29  12/20      Functional ER To ear bil            Flexion  R 6.2 lbs, L 7.5 lbs            Comb hair unable   unable  Able to reach sides of head but not back        flexion   L 90 R 100      Passive  L 130  R 130                     TREATMENT 12/20:  Therapeutic Exercise: - nu-step - L2 - 5' - 250 steps - chest press (reclined) - 0# - 3x10 - Low row - 3x10 - YTB - shoulder ext - YTB - 2x10 - Shoulder isometric ER in supine  Manual Therapy: - ER and flexion ROM with gentle oscillations at end range  10 min heat following session   TREATMENT 11/29:  Therapeutic Exercise: - nu-step - L5 - 3' - 100 steps - dowel chest press (reclined) - 3x10 - Low row - 3x10 - 10# - high row - 3x10 - 5#  Manual Therapy: - ER and flexion ROM with gentle oscillations at end range  10 min heat following session  ASSESSMENT:   CLINICAL IMPRESSION: Pt remains extremely limited d/t high levels of pain.  We continue concentration on gentle ROM and strengthening as tolerated.  Pt is able to tolerated isometric ER today with minimal increase in pain.  Will re-eval next visit.  OBJECTIVE IMPAIRMENTS: Pain, shoulder ROM, shoulder strength   ACTIVITY LIMITATIONS: housework, lifting, reaching   PERSONAL FACTORS: See medical history and pertinent history     REHAB POTENTIAL: Fair chronic, recurring, complex medical history   CLINICAL DECISION MAKING: Stable/uncomplicated   EVALUATION COMPLEXITY: Low     GOALS:     SHORT TERM GOALS: Target date: 01/07/2022   Tina Watkins will be >75% HEP compliant to improve carryover between sessions and facilitate independent management of condition   Evaluation (12/10/2021): ongoing Goal status: MET 01/07/22: Pt reports adherence      LONG TERM GOALS: Target date: 02/04/2022   Tina Watkins will show a  >/= 15  pt improvement in her QUICK DASH score (MCID is 13.4 pts or 8%) as a proxy for functional improvement    Evaluation/Baseline (12/10/2021): 29 pts Goal status: INITIAL     2.  Tina Watkins will be able to comb her hair, not limited by pain or ROM   Evaluation/Baseline (12/10/2021): limited Goal status: INITIAL     3.  Tina Watkins will be able to reach to low shelf, not limited by pain or ROM   Evaluation/Baseline (12/10/2021): limited Goal status: INITIAL     4.  Tina Watkins will demonstrate >110 degrees of active ROM in flexion to allow completion of activities involving reaching [place], not limited by pain   Evaluation/Baseline (12/10/2021):    UPPER EXTREMITY AROM:   AROM/PROM Right 12/10/2021 Left 12/10/2021  Shoulder flexion 90/100 80/90  Shoulder abduction 70 70  Shoulder internal rotation      Shoulder external rotation      Functional IR PSIS PSIS  Functional ER ear ear  Shoulder extension      Elbow extension      Elbow flexion        (Blank rows = not tested, N = WNL, * = concordant pain with testing)   Goal status: INITIAL       PLAN: PT FREQUENCY: 1-2x/week   PT DURATION: 8 weeks (Ending 02/04/2022)   PLANNED INTERVENTIONS: Therapeutic exercises, Aquatic therapy, Therapeutic activity, Neuro Muscular re-education, Gait training, Patient/Family education, Joint mobilization, Dry Needling, Electrical stimulation, Spinal mobilization and/or manipulation, Moist heat, Taping, Vasopneumatic device, Ionotophoresis 73m/ml Dexamethasone, and Manual therapy   PLAN FOR NEXT SESSION: progressive shoulder strengthening, MT for bil shoulder capsules, ROM progression   KKevan NyReinhartsen PT 01/21/2022, 9:58 AM

## 2022-02-04 ENCOUNTER — Telehealth: Payer: Self-pay | Admitting: Nurse Practitioner

## 2022-02-04 ENCOUNTER — Encounter: Payer: Self-pay | Admitting: Physical Therapy

## 2022-02-04 ENCOUNTER — Ambulatory Visit: Payer: Medicare Other | Attending: Internal Medicine | Admitting: Physical Therapy

## 2022-02-04 DIAGNOSIS — G8929 Other chronic pain: Secondary | ICD-10-CM | POA: Diagnosis present

## 2022-02-04 DIAGNOSIS — M25512 Pain in left shoulder: Secondary | ICD-10-CM | POA: Diagnosis present

## 2022-02-04 DIAGNOSIS — M25612 Stiffness of left shoulder, not elsewhere classified: Secondary | ICD-10-CM | POA: Diagnosis present

## 2022-02-04 DIAGNOSIS — M6281 Muscle weakness (generalized): Secondary | ICD-10-CM

## 2022-02-04 DIAGNOSIS — M25611 Stiffness of right shoulder, not elsewhere classified: Secondary | ICD-10-CM

## 2022-02-04 DIAGNOSIS — M25511 Pain in right shoulder: Secondary | ICD-10-CM | POA: Insufficient documentation

## 2022-02-04 NOTE — Telephone Encounter (Signed)
Pt was just seen 01/12/22. Please advise if you are ok with the referral

## 2022-02-04 NOTE — Therapy (Signed)
Progress Note Reporting Period 11/08 to 1/3  See note below for Objective Data and Assessment of Progress/Goals.      Patient Name: Tina Watkins MRN: [id number] DOB:[birth year], 57 y.o., female Today's Date: 02/04/2022  PCP: Bo Merino I, NP   REFERRING PROVIDER: Cleophas Dunker, *   PT End of Session - 02/04/22 [phone]     Visit Number 7    Date for PT Re-Evaluation 04/01/22    Authorization Type MEDICARE PART A AND B; MEDICAID [place] ACCESS    PT Start Time [phone]    PT Stop Time 0957    PT Time Calculation (min) 41 min    Activity Tolerance Patient tolerated treatment well;Patient limited by pain    Behavior During Therapy [facility] for tasks assessed/performed              Past Medical History:  Diagnosis Date   Bacteremia due to Gram-negative bacteria 05/23/2011   Blind    right eye   Blind right eye    CHF (congestive heart failure) ([place])    Chronic lower back pain    Complication of anesthesia    "woke up during OR; I have an extremely high tolerance" (12/11/2011) 1 procedure was graft; the other procedures were procedures that are typically done with sedation.   DDD (degenerative disc disease), cervical    Depression    Dysrhythmia    "tachycardia" (12/11/2011) new onset afib 10/15/14 EKG   E coli bacteremia 06/18/2011   Elevated LDL cholesterol level 12/2018   ESRD (end stage renal disease) ([place]) 06/12/2011   Fibromyalgia    Gastroparesis    Gastroparesis    GERD (gastroesophageal reflux disease)    Glaucoma    right eye   Gout    H/O carpal tunnel syndrome    Headache 10/2019   Headache(784.0)    "not often anymore" (12/11/2011)   Herpes genitalia 1994   History of blood transfusion    "more than a few times" (12/11/2011)   History of stomach ulcers    Hypotension    Iron deficiency anemia    New onset a-fib ([place])    10/15/14 EKG   Osteopenia    Peripheral neuropathy 11/2018   Pressure ulcer of sacral region, stage 1 07/2019   Seizures  ([place]) 1994   "post transplant; only have had that one" (12/11/2011)   Spinal stenosis in cervical region    Stroke [facility])     left basal ganglia lacunar infarct; Right frontal lobe lacunar infarct.   Stroke [facility]) ~ 1999; 2001   "briefly lost my vision; lost my right eye" (12/11/2011)   Vitamin D deficiency 12/2018   Past Surgical History:  Procedure Laterality Date   ANTERIOR CERVICAL DECOMP/DISCECTOMY FUSION N/A 01/08/2015   Procedure: Anterior Cervical Three-Four/Four-Five Decompression/Diskectomy/Fusion;  Surgeon: Leeroy Cha, MD;  Location: [facility];  Service: Neurosurgery;  Laterality: N/A;  C3-4 C4-5 Anterior cervical decompression/diskectomy/fusion   APPENDECTOMY  ~ 2004   BIOPSY  09/12/2020   Procedure: BIOPSY;  Surgeon: Milus Banister, MD;  Location: [facility] ENDOSCOPY;  Service: Endoscopy;;   CATARACT EXTRACTION     right eye   COLONOSCOPY     COLONOSCOPY WITH PROPOFOL N/A 09/12/2020   Procedure: COLONOSCOPY WITH PROPOFOL;  Surgeon: Milus Banister, MD;  Location: [facility] ENDOSCOPY;  Service: Endoscopy;  Laterality: N/A;   ENUCLEATION  2001   "right"   ESOPHAGOGASTRODUODENOSCOPY (EGD) WITH PROPOFOL N/A 04/21/2012   Procedure: ESOPHAGOGASTRODUODENOSCOPY (EGD) WITH PROPOFOL;  Surgeon: Melene Plan  Ardis Hughs, MD;  Location: [facility];  Service: Endoscopy;  Laterality: N/A;   ESOPHAGOGASTRODUODENOSCOPY (EGD) WITH PROPOFOL N/A 09/12/2020   Procedure: ESOPHAGOGASTRODUODENOSCOPY (EGD) WITH PROPOFOL;  Surgeon: Milus Banister, MD;  Location: [facility]OS[facility];  Service: Endoscopy;  Laterality: N/A;   INSERTION OF DIALYSIS CATHETER  1988   "AV graft LUA & LFA; LUA worked for 1 day; LFA never worked"   IR FLUORO GUIDE CV LINE RIGHT  07/11/2019   IR FLUORO GUIDE CV LINE RIGHT  10/20/2019   IR FLUORO GUIDE CV LINE RIGHT  05/31/2020   IR PTA VENOUS EXCEPT DIALYSIS CIRCUIT  05/31/2020   IR RADIOLOGY PERIPHERAL GUIDED IV START  07/11/2019   IR US GUIDE VASC ACCESS RIGHT  07/11/2019   IR US GUIDE VASC ACCESS  RIGHT  07/11/2019   IR US GUIDE VASC ACCESS RIGHT  07/11/2019   IR VENOCAVAGRAM IVC  05/31/2020   KIDNEY TRANSPLANT  1994; 1999; 2005   "right"   MULTIPLE TOOTH EXTRACTIONS     POLYPECTOMY  09/12/2020   Procedure: POLYPECTOMY;  Surgeon: Milus Banister, MD;  Location: [facility];  Service: Endoscopy;;   RIGHT HEART CATH N/A 03/23/2019   Procedure: RIGHT HEART CATH;  Surgeon: Jolaine Artist, MD;  Location: [place] [place] [facility] CV LAB;  Service: Cardiovascular;  Laterality: N/A;   [place]?; 1994; 2005   Patient Active Problem List   Diagnosis Date Noted   Bilateral hip pain 01/14/2022   Prolonged Q-T interval on ECG 09/07/2021   Nausea & vomiting 09/06/2021   Bilateral shoulder pain 06/06/2021   Bilateral hand swelling 06/06/2021   Cervical radiculitis 01/24/2021   Acute respiratory failure with hypoxia ([place]) 10/23/2020   Coagulation defect, unspecified ([place]) 08/01/2020   Aortic atherosclerosis ([place]) 07/10/2020   Secondary hypercoagulable state ([place]) 04/23/2020   Atrial flutter ([place]) 03/29/2020   Chronic pain of both knees 03/13/2020   Chills (without fever) 12/12/2019   Other pancytopenia ([place])    Decubitus ulcer of sacral region, stage 1 08/18/2019   HCAP (healthcare-associated pneumonia) 08/05/2019   Pressure injury of skin 07/24/2019   Chest congestion 07/24/2019   Itching 07/24/2019   Weakness 06/13/2019   Pneumonia due to COVID-19 virus 05/09/2019   Hypotension 05/09/2019   Symptomatic anemia 05/09/2019   CHF (congestive heart failure) ([place]) 05/09/2019   Swollen abdomen 01/04/2019   DDD (degenerative disc disease), cervical 12/06/2018   Neuropathy 12/06/2018   Paresthesia 10/11/2018   Neck pain 10/11/2018   Tobacco abuse 11/20/2017   Right leg swelling 11/20/2017   Right leg pain 11/20/2017   Stroke (cerebrum) ([place]) 11/20/2017   GERD (gastroesophageal reflux disease) 11/20/2017   Acute venous embolism and thrombosis of deep vessels of  proximal lower extremity, right ([place]) 11/20/2017   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    Thrush, oral    AKI (acute kidney injury) ([place])    Multifocal pneumonia 08/09/2017   ETD (Eustachian tube dysfunction), bilateral 08/03/2017   Acute recurrent pansinusitis 07/21/2017   Chest pain 09/29/2015   Cervical stenosis of spinal canal 01/08/2015   Urinary tract infectious disease    Muscle spasms of neck 06/15/2014   Bleeding hemorrhoid 06/15/2014   Anemia in chronic kidney disease 06/15/2014   Pyelonephritis, acute 06/13/2014   Sepsis ([place]) 06/13/2014   History of kidney transplant    Abnormal CT scan 09/14/2012   Chronic pain syndrome 04/09/2012   Dehydration, mild 04/09/2012  Herpes infection 06/23/2011   Anxiety 06/23/2011   E coli bacteremia 06/18/2011   ESRD (end stage renal disease) ([place]) 06/12/2011   UTI (lower urinary tract infection) 06/12/2011   Bacteremia due to Gram-negative bacteria 05/23/2011   History of renal transplantation 05/22/2011   Septic shock(785.52) 05/22/2011   Acute on chronic kidney failure ([place]) 05/22/2011   Gastroparesis 04/24/2008   WEIGHT LOSS 08/24/2007   NAUSEA AND VOMITING 08/24/2007    THERAPY DIAG:  Chronic left shoulder pain  Chronic right shoulder pain  Muscle weakness (generalized)  Stiffness of left shoulder, not elsewhere classified  Stiffness of right shoulder, not elsewhere classified  Right shoulder pain, unspecified chronicity   Rationale for Evaluation and Treatment Rehabilitation  REFERRING DIAG:  Bil shoulder adhesive capuslitis   PERTINENT HISTORY: Hx of stroke, DVT, CHF, Afib, Neuropathy, neck pain, gout, DDD, ESRD, cervical stenosis, dialysis (T, Th, sat), kidney transplant (failed)      PRECAUTIONS/RESTRICTIONS:   see pertinent history   SUBJECTIVE:  Pt reports that she is in a gout flare and has high levels of pain in both shoulders  Pain:  Are you having pain?  Yes Pain location: diffuse shoulder pain R>L NPRS scale:  current 5/10  Aggravating factors: reaching, lifting, activity Relieving factors: rest Pain description: constant and aching Stage: Chronic 24 hour pattern: worse with activity   OBJECTIVE: (objective measures completed at initial evaluation unless otherwise dated)  ASTERISK SIGNS     Asterisk Signs Eval (12/10/2021)  1/17  11/29  12/20 12/20  1/3   Functional ER To ear bil            Flexion  R 6.2 lbs, L 7.5 lbs            Comb hair unable   unable  Able to reach sides of head but not back        flexion   L 90 R 100      Passive  L 130  R 130  Active L 100 R 90                   TREATMENT 1/3:  Therapeutic Exercise: - nu-step - L2 - 5' - 250 steps - chest press (reclined) - 0# - 3x10 - Low row - 3x10 - YTB (NT) - shoulder ext - YTB - 2x10 (NT) - Shoulder isometric IR/ER in supine  Therapeutic Activity - collecting information for goals, checking progress, and reviewing with patient  Manual Therapy: - ER and flexion ROM with gentle oscillations at end range  10 min heat following session   TREATMENT 11/29:  Therapeutic Exercise: - nu-step - L5 - 3' - 100 steps - dowel chest press (reclined) - 3x10 - Low row - 3x10 - 10# - high row - 3x10 - 5#  Manual Therapy: - ER and flexion ROM with gentle oscillations at end range  10 min heat following session  ASSESSMENT:   CLINICAL IMPRESSION: Tina Watkins has progressed fair with therapy.  Improved impairments include: shoulder ROM PROM>AROM.  Functional improvements include: ability to reach to low shelf and carry light objects with less difficulty; depending on day she has improved ability to comb her hair.  Progressions needed include: continued work on ROM and basic strengthening; R/C strengthening as tolerated.  Barriers to progress include: chronic condition, co-morbidities, high levels of pain.  Please see GOALS section for progress on short term and long  term goals established at evaluation.  I recommend continuation of PT to  allow completion of remaining goals and continued functional progression.  OBJECTIVE IMPAIRMENTS: Pain, shoulder ROM, shoulder strength   ACTIVITY LIMITATIONS: housework, lifting, reaching   PERSONAL FACTORS: See medical history and pertinent history     REHAB POTENTIAL: Fair chronic, recurring, complex medical history   CLINICAL DECISION MAKING: Stable/uncomplicated   EVALUATION COMPLEXITY: Low     GOALS:     SHORT TERM GOALS: Target date: 01/07/2022   Tina Watkins will be >75% HEP compliant to improve carryover between sessions and facilitate independent management of condition   Evaluation (12/10/2021): ongoing Goal status: MET 01/07/22: Pt reports adherence      LONG TERM GOALS: Target date: 02/04/2022 (extended to 04/01/22   Tina Watkins will show a >/= 15 pt improvement in her QUICK DASH score (MCID is 13.4 pts or 8%) as a proxy for functional improvement    Evaluation/Baseline (12/10/2021): 29 pts 02/04/22: QuickDASH Score: 63.6 / 100 = 63.6 % Goal status: Ongoing     2.  Tina Watkins will be able to comb her hair, not limited by pain or ROM   Evaluation/Baseline (12/10/2021): limited 02/04/2022: continues to be limited d/t pain Goal status: INITIAL     3.  Tina Watkins will be able to reach to low shelf, not limited by pain or ROM   Evaluation/Baseline (12/10/2021): limited 1/3: able to reach first shelf with some pain Goal status: progressing     4.  Tina Watkins will demonstrate >110 degrees of active ROM in flexion to allow completion of activities involving reaching [place], not limited by pain   Evaluation/Baseline (12/10/2021):    UPPER EXTREMITY AROM:   AROM/PROM Right 12/10/2021 Left 12/10/2021 R 1/3 L 1/3  Shoulder flexion 90/100 80/90 90/130 100/130  Shoulder abduction 70 70    Shoulder internal rotation        Shoulder external rotation        Functional IR PSIS PSIS    Functional ER ear ear     Shoulder extension        Elbow extension        Elbow flexion          (Blank rows = not tested, N = WNL, * = concordant pain with testing)   Goal status: INITIAL       PLAN: PT FREQUENCY: 1-2x/week   PT DURATION: 8 weeks (Ending 04/01/2022)   PLANNED INTERVENTIONS: Therapeutic exercises, Aquatic therapy, Therapeutic activity, Neuro Muscular re-education, Gait training, Patient/Family education, Joint mobilization, Dry Needling, Electrical stimulation, Spinal mobilization and/or manipulation, Moist heat, Taping, Vasopneumatic device, Ionotophoresis 76m/ml Dexamethasone, and Manual therapy   PLAN FOR NEXT SESSION: progressive shoulder strengthening, MT for bil shoulder capsules, ROM progression   KKevan NyReinhartsen PT 02/04/2022, 10:02 AM

## 2022-02-04 NOTE — Telephone Encounter (Signed)
Requesting nuero referral - went to [facility] in 2020

## 2022-02-05 NOTE — Telephone Encounter (Signed)
That referral was placed by pain management. If she is still seeing pain management please have her reach out to them to redo the referral. Thanks.

## 2022-02-06 ENCOUNTER — Ambulatory Visit: Payer: Medicare Other | Admitting: Physical Therapy

## 2022-02-06 ENCOUNTER — Encounter: Payer: Self-pay | Admitting: Physical Therapy

## 2022-02-06 ENCOUNTER — Telehealth (HOSPITAL_COMMUNITY): Payer: Self-pay | Admitting: *Deleted

## 2022-02-06 DIAGNOSIS — M6281 Muscle weakness (generalized): Secondary | ICD-10-CM

## 2022-02-06 DIAGNOSIS — M25512 Pain in left shoulder: Secondary | ICD-10-CM | POA: Diagnosis not present

## 2022-02-06 DIAGNOSIS — G8929 Other chronic pain: Secondary | ICD-10-CM

## 2022-02-06 NOTE — Therapy (Signed)
Treatment note  Patient Name: Tina Watkins MRN: [id number] DOB:Aug 04, 1965, 57 y.o., female Today's Date: 02/06/2022  PCP: Bo Merino I, NP   REFERRING PROVIDER: Cleophas Dunker, *   PT End of Session - 02/06/22 0706     Visit Number 8    Date for PT Re-Evaluation 04/01/22    Authorization Type MEDICARE PART A AND B; MEDICAID [place] ACCESS    Progress Note Due on Visit 70    PT Start Time 0705    PT Stop Time 0745    PT Time Calculation (min) 40 min    Activity Tolerance Patient tolerated treatment well;Patient limited by pain    Behavior During Therapy [facility] for tasks assessed/performed              Past Medical History:  Diagnosis Date   Bacteremia due to Gram-negative bacteria 05/23/2011   Blind    right eye   Blind right eye    CHF (congestive heart failure) ([place])    Chronic lower back pain    Complication of anesthesia    "woke up during OR; I have an extremely high tolerance" (12/11/2011) 1 procedure was graft; the other procedures were procedures that are typically done with sedation.   DDD (degenerative disc disease), cervical    Depression    Dysrhythmia    "tachycardia" (12/11/2011) new onset afib 10/15/14 EKG   E coli bacteremia 06/18/2011   Elevated LDL cholesterol level 12/2018   ESRD (end stage renal disease) ([place]) 06/12/2011   Fibromyalgia    Gastroparesis    Gastroparesis    GERD (gastroesophageal reflux disease)    Glaucoma    right eye   Gout    H/O carpal tunnel syndrome    Headache 10/2019   Headache(784.0)    "not often anymore" (12/11/2011)   Herpes genitalia 1994   History of blood transfusion    "more than a few times" (12/11/2011)   History of stomach ulcers    Hypotension    Iron deficiency anemia    New onset a-fib ([place])    10/15/14 EKG   Osteopenia    Peripheral neuropathy 11/2018   Pressure ulcer of sacral region, stage 1 07/2019   Seizures ([place]) 1994   "post transplant; only have had that one" (12/11/2011)    Spinal stenosis in cervical region    Stroke [facility])     left basal ganglia lacunar infarct; Right frontal lobe lacunar infarct.   Stroke [facility]) ~ 1999; 2001   "briefly lost my vision; lost my right eye" (12/11/2011)   Vitamin D deficiency 12/2018   Past Surgical History:  Procedure Laterality Date   ANTERIOR CERVICAL DECOMP/DISCECTOMY FUSION N/A 01/08/2015   Procedure: Anterior Cervical Three-Four/Four-Five Decompression/Diskectomy/Fusion;  Surgeon: Leeroy Cha, MD;  Location: [place] NEURO ORS;  Service: Neurosurgery;  Laterality: N/A;  C3-4 C4-5 Anterior cervical decompression/diskectomy/fusion   APPENDECTOMY  ~ 2004   BIOPSY  09/12/2020   Procedure: BIOPSY;  Surgeon: Milus Banister, MD;  Location: [facility] ENDOSCOPY;  Service: Endoscopy;;   CATARACT EXTRACTION     right eye   COLONOSCOPY     COLONOSCOPY WITH PROPOFOL N/A 09/12/2020   Procedure: COLONOSCOPY WITH PROPOFOL;  Surgeon: Milus Banister, MD;  Location: [facility] ENDOSCOPY;  Service: Endoscopy;  Laterality: N/A;   ENUCLEATION  2001   "right"   ESOPHAGOGASTRODUODENOSCOPY (EGD) WITH PROPOFOL N/A 04/21/2012   Procedure: ESOPHAGOGASTRODUODENOSCOPY (EGD) WITH PROPOFOL;  Surgeon: Milus Banister, MD;  Location: [facility] ENDOSCOPY;  Service: Endoscopy;  Laterality:  N/A;   ESOPHAGOGASTRODUODENOSCOPY (EGD) WITH PROPOFOL N/A 09/12/2020   Procedure: ESOPHAGOGASTRODUODENOSCOPY (EGD) WITH PROPOFOL;  Surgeon: Milus Banister, MD;  Location: [facility] ENDOSCOPY;  Service: Endoscopy;  Laterality: N/A;   INSERTION OF DIALYSIS CATHETER  1988   "AV graft LUA & LFA; LUA worked for 1 day; LFA never worked"   IR FLUORO GUIDE CV LINE RIGHT  07/11/2019   IR FLUORO GUIDE CV LINE RIGHT  10/20/2019   IR FLUORO GUIDE CV LINE RIGHT  05/31/2020   IR PTA VENOUS EXCEPT DIALYSIS CIRCUIT  05/31/2020   IR RADIOLOGY PERIPHERAL GUIDED IV START  07/11/2019   IR US GUIDE VASC ACCESS RIGHT  07/11/2019   IR US GUIDE VASC ACCESS RIGHT  07/11/2019   IR US GUIDE VASC ACCESS RIGHT  07/11/2019   IR  VENOCAVAGRAM IVC  05/31/2020   KIDNEY TRANSPLANT  1994; 1999; 2005   "right"   MULTIPLE TOOTH EXTRACTIONS     POLYPECTOMY  09/12/2020   Procedure: POLYPECTOMY;  Surgeon: Milus Banister, MD;  Location: [facility] ENDOSCOPY;  Service: Endoscopy;;   RIGHT HEART CATH N/A 03/23/2019   Procedure: RIGHT HEART CATH;  Surgeon: Jolaine Artist, MD;  Location: [place] CV LAB;  Service: Cardiovascular;  Laterality: N/A;   [place]?; 1994; 2005   Patient Active Problem List   Diagnosis Date Noted   Bilateral hip pain 01/14/2022   Prolonged Q-T interval on ECG 09/07/2021   Nausea & vomiting 09/06/2021   Bilateral shoulder pain 06/06/2021   Bilateral hand swelling 06/06/2021   Cervical radiculitis 01/24/2021   Acute respiratory failure with hypoxia ([place]) 10/23/2020   Coagulation defect, unspecified ([place]) 08/01/2020   Aortic atherosclerosis ([place]) 07/10/2020   Secondary hypercoagulable state ([place]) 04/23/2020   Atrial flutter ([place]) 03/29/2020   Chronic pain of both knees 03/13/2020   Chills (without fever) 12/12/2019   Other pancytopenia ([place])    Decubitus ulcer of sacral region, stage 1 08/18/2019   HCAP (healthcare-associated pneumonia) 08/05/2019   Pressure injury of skin 07/24/2019   Chest congestion 07/24/2019   Itching 07/24/2019   Weakness 06/13/2019   Pneumonia due to COVID-19 virus 05/09/2019   Hypotension 05/09/2019   Symptomatic anemia 05/09/2019   CHF (congestive heart failure) ([place]) 05/09/2019   Swollen abdomen 01/04/2019   DDD (degenerative disc disease), cervical 12/06/2018   Neuropathy 12/06/2018   Paresthesia 10/11/2018   Neck pain 10/11/2018   Tobacco abuse 11/20/2017   Right leg swelling 11/20/2017   Right leg pain 11/20/2017   Stroke (cerebrum) ([place]) 11/20/2017   GERD (gastroesophageal reflux disease) 11/20/2017   Acute venous embolism and thrombosis of deep vessels of proximal lower extremity, right ([place]) 11/20/2017   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    Thrush, oral    AKI (acute kidney injury) ([place])    Multifocal pneumonia 08/09/2017   ETD (Eustachian tube dysfunction), bilateral 08/03/2017   Acute recurrent pansinusitis 07/21/2017   Chest pain 09/29/2015   Cervical stenosis of spinal canal 01/08/2015   Urinary tract infectious disease    Muscle spasms of neck 06/15/2014   Bleeding hemorrhoid 06/15/2014   Anemia in chronic kidney disease 06/15/2014   Pyelonephritis, acute 06/13/2014   Sepsis ([place]) 06/13/2014   History of kidney transplant    Abnormal CT scan 09/14/2012   Chronic pain syndrome 04/09/2012   Dehydration, mild 04/09/2012   Herpes infection 06/23/2011   Anxiety 06/23/2011   E coli  bacteremia 06/18/2011   ESRD (end stage renal disease) ([place]) 06/12/2011   UTI (lower urinary tract infection) 06/12/2011   Bacteremia due to Gram-negative bacteria 05/23/2011   History of renal transplantation 05/22/2011   Septic shock(785.52) 05/22/2011   Acute on chronic kidney failure ([place]) 05/22/2011   Gastroparesis 04/24/2008   WEIGHT LOSS 08/24/2007   NAUSEA AND VOMITING 08/24/2007    THERAPY DIAG:  Chronic left shoulder pain  Muscle weakness (generalized)  Chronic right shoulder pain   Rationale for Evaluation and Treatment Rehabilitation  REFERRING DIAG:  Bil shoulder adhesive capuslitis   PERTINENT HISTORY: Hx of stroke, DVT, CHF, Afib, Neuropathy, neck pain, gout, DDD, ESRD, cervical stenosis, dialysis (T, Th, sat), kidney transplant (failed)      PRECAUTIONS/RESTRICTIONS:   see pertinent history   SUBJECTIVE:  Pt states that she feels somewhat better today than last visit.  She has been working on her exercises at home.  Pain:  Are you having pain? Yes Pain location: diffuse shoulder pain R>L NPRS scale:  current 5/10  Aggravating factors: reaching, lifting, activity Relieving factors: rest Pain description: constant and aching Stage:  Chronic 24 hour pattern: worse with activity   OBJECTIVE: (objective measures completed at initial evaluation unless otherwise dated)  ASTERISK SIGNS     Asterisk Signs Eval (12/10/2021)  1/17  11/29  12/20 12/20  1/3   Functional ER To ear bil            Flexion  R 6.2 lbs, L 7.5 lbs            Comb hair unable   unable  Able to reach sides of head but not back        flexion   L 90 R 100      Passive  L 130  R 130  Active L 100 R 90                   TREATMENT 1/5:  Therapeutic Exercise: - nu-step - L5 - 10' - 445 steps - chest press (reclined) - 0# - 3x10 - Low row - 3x10 - YTB (NT) - shoulder ext - YTB - 2x10 (NT) - Shoulder isometric IR/ER in supine - 20x ea  Manual Therapy: - ER and flexion ROM with gentle oscillations at end range  10 min heat following session   TREATMENT 11/29:  Therapeutic Exercise: - nu-step - L5 - 3' - 100 steps - dowel chest press (reclined) - 3x10 - Low row - 3x10 - 10# - high row - 3x10 - 5#  Manual Therapy: - ER and flexion ROM with gentle oscillations at end range  10 min heat following session  ASSESSMENT:   CLINICAL IMPRESSION: Roberto tolerated session well with no adverse reaction.  She shows significantly lower pain today and improved ROM compared to last session.  She is able to tolerate increased volume of ER/IR isometrics, although ER is still challenging L>R.  We will continue to progress strengthening as tolerated.  OBJECTIVE IMPAIRMENTS: Pain, shoulder ROM, shoulder strength   ACTIVITY LIMITATIONS: housework, lifting, reaching   PERSONAL FACTORS: See medical history and pertinent history     REHAB POTENTIAL: Fair chronic, recurring, complex medical history   CLINICAL DECISION MAKING: Stable/uncomplicated   EVALUATION COMPLEXITY: Low     GOALS:     SHORT TERM GOALS: Target date: 01/07/2022   Larisha will be >75% HEP compliant to improve carryover between sessions and facilitate independent management of  condition   Evaluation (  12/10/2021): ongoing Goal status: MET 01/07/22: Pt reports adherence      LONG TERM GOALS: Target date: 02/04/2022 (extended to 04/01/22)   Sandria Bales will show a >/= 15 pt improvement in her QUICK DASH score (MCID is 13.4 pts or 8%) as a proxy for functional improvement    Evaluation/Baseline (12/10/2021): 29 pts 02/04/22: QuickDASH Score: 63.6 / 100 = 63.6 % Goal status: Ongoing     2.  Consuela will be able to comb her hair, not limited by pain or ROM   Evaluation/Baseline (12/10/2021): limited 02/04/2022: continues to be limited d/t pain Goal status: INITIAL     3.  Ascencion will be able to reach to low shelf, not limited by pain or ROM   Evaluation/Baseline (12/10/2021): limited 1/3: able to reach first shelf with some pain Goal status: progressing     4.  Bobby will demonstrate >110 degrees of active ROM in flexion to allow completion of activities involving reaching [place], not limited by pain   Evaluation/Baseline (12/10/2021):    UPPER EXTREMITY AROM:   AROM/PROM Right 12/10/2021 Left 12/10/2021 R 1/3 L 1/3  Shoulder flexion 90/100 80/90 90/130 100/130  Shoulder abduction 70 70    Shoulder internal rotation        Shoulder external rotation        Functional IR PSIS PSIS    Functional ER ear ear    Shoulder extension        Elbow extension        Elbow flexion          (Blank rows = not tested, N = WNL, * = concordant pain with testing)   Goal status: INITIAL       PLAN: PT FREQUENCY: 1-2x/week   PT DURATION: 8 weeks (Ending 04/01/2022)   PLANNED INTERVENTIONS: Therapeutic exercises, Aquatic therapy, Therapeutic activity, Neuro Muscular re-education, Gait training, Patient/Family education, Joint mobilization, Dry Needling, Electrical stimulation, Spinal mobilization and/or manipulation, Moist heat, Taping, Vasopneumatic device, Ionotophoresis '4mg'$ /ml Dexamethasone, and Manual therapy   PLAN FOR NEXT SESSION: progressive shoulder  strengthening, MT for bil shoulder capsules, ROM progression   Kevan Ny Laroy Mustard PT 02/06/2022, 8:10 AM

## 2022-02-06 NOTE — Telephone Encounter (Signed)
Informed patient it is ok for her to get the RSV vaccine.

## 2022-02-06 NOTE — Telephone Encounter (Signed)
Pt left vm asking if it is ok to take the RSV vaccine with her heart condition and medications.  Routed to [employer]

## 2022-02-08 ENCOUNTER — Other Ambulatory Visit: Payer: Self-pay | Admitting: Nurse Practitioner

## 2022-02-11 ENCOUNTER — Ambulatory Visit: Payer: Medicare Other | Admitting: Physical Therapy

## 2022-02-12 ENCOUNTER — Telehealth: Payer: Self-pay | Admitting: Gastroenterology

## 2022-02-12 NOTE — Telephone Encounter (Signed)
Patient is calling states she was recently diagnosed with gastroparesis and is having questions regarding testing would like to speak with a nurse. Please advise

## 2022-02-12 NOTE — Telephone Encounter (Signed)
The pt actually wants to discuss if her bladder is causing her symptoms of nausea and vomiting.  She is going to check with her PCP or GYN to see if her bladder is causing any problems. She will call back if needed.

## 2022-02-12 NOTE — Telephone Encounter (Signed)
Left message on machine to call back  

## 2022-02-13 ENCOUNTER — Ambulatory Visit: Payer: Medicare Other | Admitting: Physical Therapy

## 2022-02-13 ENCOUNTER — Encounter: Payer: Self-pay | Admitting: Physical Therapy

## 2022-02-13 DIAGNOSIS — M6281 Muscle weakness (generalized): Secondary | ICD-10-CM

## 2022-02-13 DIAGNOSIS — M25512 Pain in left shoulder: Secondary | ICD-10-CM | POA: Diagnosis not present

## 2022-02-13 DIAGNOSIS — G8929 Other chronic pain: Secondary | ICD-10-CM

## 2022-02-13 DIAGNOSIS — M25612 Stiffness of left shoulder, not elsewhere classified: Secondary | ICD-10-CM

## 2022-02-13 NOTE — Therapy (Signed)
Treatment note  Patient Name: Tina Watkins MRN: [id number] DOB:[birth year], 57 y.o., female Today's Date: 02/13/2022  PCP: Bo Merino I, NP   REFERRING PROVIDER: Cleophas Dunker, *   PT End of Session - 02/13/22 [phone]     Visit Number 9    Date for PT Re-Evaluation 04/01/22    Authorization Type MEDICARE PART A AND B; MEDICAID [place] ACCESS    Progress Note Due on Visit 42    PT Start Time 0712   pt arrived late   PT Stop Time 0742    PT Time Calculation (min) 30 min    Activity Tolerance Patient tolerated treatment well;Patient limited by pain    Behavior During Therapy [facility] for tasks assessed/performed              Past Medical History:  Diagnosis Date   Bacteremia due to Gram-negative bacteria 05/23/2011   Blind    right eye   Blind right eye    CHF (congestive heart failure) ([place])    Chronic lower back pain    Complication of anesthesia    "woke up during OR; I have an extremely high tolerance" (12/11/2011) 1 procedure was graft; the other procedures were procedures that are typically done with sedation.   DDD (degenerative disc disease), cervical    Depression    Dysrhythmia    "tachycardia" (12/11/2011) new onset afib 10/15/14 EKG   E coli bacteremia 06/18/2011   Elevated LDL cholesterol level 12/2018   ESRD (end stage renal disease) ([place]) 06/12/2011   Fibromyalgia    Gastroparesis    Gastroparesis    GERD (gastroesophageal reflux disease)    Glaucoma    right eye   Gout    H/O carpal tunnel syndrome    Headache 10/2019   Headache(784.0)    "not often anymore" (12/11/2011)   Herpes genitalia 1994   History of blood transfusion    "more than a few times" (12/11/2011)   History of stomach ulcers    Hypotension    Iron deficiency anemia    New onset a-fib ([place])    10/15/14 EKG   Osteopenia    Peripheral neuropathy 11/2018   Pressure ulcer of sacral region, stage 1 07/2019   Seizures ([place]) 1994   "post transplant; only have had that one"  (12/11/2011)   Spinal stenosis in cervical region    Stroke [facility])     left basal ganglia lacunar infarct; Right frontal lobe lacunar infarct.   Stroke [facility]) ~ 1999; 2001   "briefly lost my vision; lost my right eye" (12/11/2011)   Vitamin D deficiency 12/2018   Past Surgical History:  Procedure Laterality Date   ANTERIOR CERVICAL DECOMP/DISCECTOMY FUSION N/A 01/08/2015   Procedure: Anterior Cervical Three-Four/Four-Five Decompression/Diskectomy/Fusion;  Surgeon: Leeroy Cha, MD;  Location: [place] NEURO ORS;  Service: Neurosurgery;  Laterality: N/A;  C3-4 C4-5 Anterior cervical decompression/diskectomy/fusion   APPENDECTOMY  ~ 2004   BIOPSY  09/12/2020   Procedure: BIOPSY;  Surgeon: Milus Banister, MD;  [facility];  Service: Endoscopy;;   CATARACT EXTRACTION     right eye   COLONOSCOPY     COLONOSCOPY WITH PROPOFOL N/A 09/12/2020   Procedure: COLONOSCOPY WITH PROPOFOL;  Surgeon: Milus Banister, MD;  [facility];  Service: Endoscopy;  Laterality: N/A;   ENUCLEATION  2001   "right"   ESOPHAGOGASTRODUODENOSCOPY (EGD) WITH PROPOFOL N/A 04/21/2012   Procedure: ESOPHAGOGASTRODUODENOSCOPY (EGD) WITH PROPOFOL;  Surgeon: Milus Banister, MD;  [facility];  Service: Endoscopy;  Laterality: N/A;   ESOPHAGOGASTRODUODENOSCOPY (EGD) WITH PROPOFOL N/A 09/12/2020   Procedure: ESOPHAGOGASTRODUODENOSCOPY (EGD) WITH PROPOFOL;  Surgeon: Milus Banister, MD;  [facility];  Service: Endoscopy;  Laterality: N/A;   INSERTION OF DIALYSIS CATHETER  1988   "AV graft LUA & LFA; LUA worked for 1 day; LFA never worked"   IR FLUORO GUIDE CV LINE RIGHT  07/11/2019   IR FLUORO GUIDE CV LINE RIGHT  10/20/2019   IR FLUORO GUIDE CV LINE RIGHT  05/31/2020   IR PTA VENOUS EXCEPT DIALYSIS CIRCUIT  05/31/2020   IR RADIOLOGY PERIPHERAL GUIDED IV START  07/11/2019   IR US GUIDE VASC ACCESS RIGHT  07/11/2019   IR US GUIDE VASC ACCESS RIGHT  07/11/2019   IR US GUIDE VASC ACCESS RIGHT  07/11/2019    IR VENOCAVAGRAM IVC  05/31/2020   KIDNEY TRANSPLANT  1994; 1999; 2005   "right"   MULTIPLE TOOTH EXTRACTIONS     POLYPECTOMY  09/12/2020   Procedure: POLYPECTOMY;  Surgeon: Milus Banister, MD;  [facility];  Service: Endoscopy;;   RIGHT HEART CATH N/A 03/23/2019   Procedure: RIGHT HEART CATH;  Surgeon: Jolaine Artist, MD;  Location: [place] CV LAB;  Service: Cardiovascular;  Laterality: N/A;   [place]?; 1994; 2005   Patient Active Problem List   Diagnosis Date Noted   Bilateral hip pain 01/14/2022   Prolonged Q-T interval on ECG 09/07/2021   Nausea & vomiting 09/06/2021   Bilateral shoulder pain 06/06/2021   Bilateral hand swelling 06/06/2021   Cervical radiculitis 01/24/2021   Acute respiratory failure with hypoxia ([place]) 10/23/2020   Coagulation defect, unspecified ([place]) 08/01/2020   Aortic atherosclerosis ([place]) 07/10/2020   Secondary hypercoagulable state ([place]) 04/23/2020   Atrial flutter ([place]) 03/29/2020   Chronic pain of both knees 03/13/2020   Chills (without fever) 12/12/2019   Other pancytopenia ([place])    Decubitus ulcer of sacral region, stage 1 08/18/2019   HCAP (healthcare-associated pneumonia) 08/05/2019   Pressure injury of skin 07/24/2019   Chest congestion 07/24/2019   Itching 07/24/2019   Weakness 06/13/2019   Pneumonia due to COVID-19 virus 05/09/2019   Hypotension 05/09/2019   Symptomatic anemia 05/09/2019   CHF (congestive heart failure) ([place]) 05/09/2019   Swollen abdomen 01/04/2019   DDD (degenerative disc disease), cervical 12/06/2018   Neuropathy 12/06/2018   Paresthesia 10/11/2018   Neck pain 10/11/2018   Tobacco abuse 11/20/2017   Right leg swelling 11/20/2017   Right leg pain 11/20/2017   Stroke (cerebrum) ([place]) 11/20/2017   GERD (gastroesophageal reflux disease) 11/20/2017   Acute venous embolism and thrombosis of deep vessels of proximal lower extremity, right ([place]) 11/20/2017   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    Thrush, oral    AKI (acute kidney injury) ([place])    Multifocal pneumonia 08/09/2017   ETD (Eustachian tube dysfunction), bilateral 08/03/2017   Acute recurrent pansinusitis 07/21/2017   Chest pain 09/29/2015   Cervical stenosis of spinal canal 01/08/2015   Urinary tract infectious disease    Muscle spasms of neck 06/15/2014   Bleeding hemorrhoid 06/15/2014   Anemia in chronic kidney disease 06/15/2014   Pyelonephritis, acute 06/13/2014   Sepsis ([place]) 06/13/2014   History of kidney transplant    Abnormal CT scan 09/14/2012   Chronic pain syndrome 04/09/2012   Dehydration, mild 04/09/2012   Herpes infection 06/23/2011   Anxiety 06/23/2011  E coli bacteremia 06/18/2011   ESRD (end stage renal disease) ([place]) 06/12/2011   UTI (lower urinary tract infection) 06/12/2011   Bacteremia due to Gram-negative bacteria 05/23/2011   History of renal transplantation 05/22/2011   Septic shock(785.52) 05/22/2011   Acute on chronic kidney failure ([place]) 05/22/2011   Gastroparesis 04/24/2008   WEIGHT LOSS 08/24/2007   NAUSEA AND VOMITING 08/24/2007    THERAPY DIAG:  Chronic left shoulder pain  Muscle weakness (generalized)  Chronic right shoulder pain  Stiffness of left shoulder, not elsewhere classified   Rationale for Evaluation and Treatment Rehabilitation  REFERRING DIAG:  Bil shoulder adhesive capuslitis   PERTINENT HISTORY: Hx of stroke, DVT, CHF, Afib, Neuropathy, neck pain, gout, DDD, ESRD, cervical stenosis, dialysis (T, Th, sat), kidney transplant (failed)      PRECAUTIONS/RESTRICTIONS:   see pertinent history   SUBJECTIVE:  Pt reports that her pain level has been somewhat lower in her shoulders over the last few days.  Pain:  Are you having pain? Yes Pain location: diffuse shoulder pain R>L NPRS scale:  current 3/10  Aggravating factors: reaching, lifting, activity Relieving factors:  rest Pain description: constant and aching Stage: Chronic 24 hour pattern: worse with activity   OBJECTIVE: (objective measures completed at initial evaluation unless otherwise dated)  ASTERISK SIGNS     Asterisk Signs Eval (12/10/2021)  1/17  11/29  12/20 12/20  1/3   Functional ER To ear bil            Flexion  R 6.2 lbs, L 7.5 lbs            Comb hair unable   unable  Able to reach sides of head but not back        flexion   L 90 R 100      Passive  L 130  R 130  Active L 100 R 90                   TREATMENT 1/12:  Therapeutic Exercise: - nu-step - L5 - 8.5' - 500 steps - chest press (reclined) - 0# - 3x10 - Low row - 3x10 - YTB (NT) - shoulder ext - YTB - 2x10 (NT) - Shoulder isometric IR/ER in supine - 30x ea - supine shoulder ER - GTB - x10 ea  Manual Therapy: - ER and flexion ROM with gentle oscillations at end range  10 min heat following session   TREATMENT 11/29:  Therapeutic Exercise: - nu-step - L5 - 3' - 100 steps - dowel chest press (reclined) - 3x10 - Low row - 3x10 - 10# - high row - 3x10 - 5#  Manual Therapy: - ER and flexion ROM with gentle oscillations at end range  10 min heat following session  ASSESSMENT:   CLINICAL IMPRESSION: Tina Watkins tolerated session well with no adverse reaction.  Pt shows reduced pain and fatigue with therapy today.  She tolerated manual stretching with minimal increase in pain.  She also had reduced pain with shoulder ER isometric which have been difficult in the past.  We progressed to low rep active ER which was still very challenging and caused some pain which dissipated with rest to baseline.  Will continue to progress as appropriate.   OBJECTIVE IMPAIRMENTS: Pain, shoulder ROM, shoulder strength   ACTIVITY LIMITATIONS: housework, lifting, reaching   PERSONAL FACTORS: See medical history and pertinent history     REHAB POTENTIAL: Fair chronic, recurring, complex medical history   CLINICAL DECISION MAKING:  Stable/uncomplicated   EVALUATION COMPLEXITY: Low     GOALS:     SHORT TERM GOALS: Target date: 01/07/2022   Tina Watkins will be >75% HEP compliant to improve carryover between sessions and facilitate independent management of condition   Evaluation (12/10/2021): ongoing Goal status: MET 01/07/22: Pt reports adherence      LONG TERM GOALS: Target date: 02/04/2022 (extended to 04/01/22)   Tina Watkins will show a >/= 15 pt improvement in her QUICK DASH score (MCID is 13.4 pts or 8%) as a proxy for functional improvement    Evaluation/Baseline (12/10/2021): 29 pts 02/04/22: QuickDASH Score: 63.6 / 100 = 63.6 % Goal status: Ongoing     2.  Tina Watkins will be able to comb her hair, not limited by pain or ROM   Evaluation/Baseline (12/10/2021): limited 02/04/2022: continues to be limited d/t pain Goal status: INITIAL     3.  Tina Watkins will be able to reach to low shelf, not limited by pain or ROM   Evaluation/Baseline (12/10/2021): limited 1/3: able to reach first shelf with some pain Goal status: progressing     4.  Tina Watkins will demonstrate >110 degrees of active ROM in flexion to allow completion of activities involving reaching OH, not limited by pain   Evaluation/Baseline (12/10/2021):    UPPER EXTREMITY AROM:   AROM/PROM Right 12/10/2021 Left 12/10/2021 R 1/3 L 1/3  Shoulder flexion 90/100 80/90 90/130 100/130  Shoulder abduction 70 70    Shoulder internal rotation        Shoulder external rotation        Functional IR PSIS PSIS    Functional ER ear ear    Shoulder extension        Elbow extension        Elbow flexion          (Blank rows = not tested, N = WNL, * = concordant pain with testing)   Goal status: INITIAL       PLAN: PT FREQUENCY: 1-2x/week   PT DURATION: 8 weeks (Ending 04/01/2022)   PLANNED INTERVENTIONS: Therapeutic exercises, Aquatic therapy, Therapeutic activity, Neuro Muscular re-education, Gait training, Patient/Family education, Joint mobilization,  Dry Needling, Electrical stimulation, Spinal mobilization and/or manipulation, Moist heat, Taping, Vasopneumatic device, Ionotophoresis '4mg'$ /ml Dexamethasone, and Manual therapy   PLAN FOR NEXT SESSION: progressive shoulder strengthening, MT for bil shoulder capsules, ROM progression   Kevan Ny Janea Schwenn PT 02/13/2022, 8:07 AM

## 2022-02-18 ENCOUNTER — Ambulatory Visit: Payer: Medicare Other

## 2022-02-18 DIAGNOSIS — M25512 Pain in left shoulder: Secondary | ICD-10-CM | POA: Diagnosis not present

## 2022-02-18 DIAGNOSIS — G8929 Other chronic pain: Secondary | ICD-10-CM

## 2022-02-18 DIAGNOSIS — M6281 Muscle weakness (generalized): Secondary | ICD-10-CM

## 2022-02-18 NOTE — Therapy (Signed)
Treatment note  Patient Name: ROXINE WHITTINGHILL MRN: [id number] DOB:1965/07/31, 57 y.o., female Today's Date: 02/18/2022  PCP: Bo Merino I, NP   REFERRING PROVIDER: Cleophas Dunker, *   PT End of Session - 02/18/22 1014     Visit Number 10    Date for PT Re-Evaluation 04/01/22    Authorization Type MEDICARE PART A AND B; MEDICAID [place] ACCESS    Progress Note Due on Visit 73    PT Start Time 1014   Pt arrived 14 mins late to appt   PT Stop Time 1050    PT Time Calculation (min) 36 min    Activity Tolerance Patient tolerated treatment well;Patient limited by pain    Behavior During Therapy [facility] for tasks assessed/performed              Past Medical History:  Diagnosis Date   Bacteremia due to Gram-negative bacteria 05/23/2011   Blind    right eye   Blind right eye    CHF (congestive heart failure) ([facility])    Chronic lower back pain    Complication of anesthesia    "woke up during OR; I have an extremely high tolerance" (12/11/2011) 1 procedure was graft; the other procedures were procedures that are typically done with sedation.   DDD (degenerative disc disease), cervical    Depression    Dysrhythmia    "tachycardia" (12/11/2011) new onset afib 10/15/14 EKG   E coli bacteremia 06/18/2011   Elevated LDL cholesterol level 12/2018   ESRD (end stage renal disease) ([place]) 06/12/2011   Fibromyalgia    Gastroparesis    Gastroparesis    GERD (gastroesophageal reflux disease)    Glaucoma    right eye   Gout    H/O carpal tunnel syndrome    Headache 10/2019   Headache(784.0)    "not often anymore" (12/11/2011)   Herpes genitalia 1994   History of blood transfusion    "more than a few times" (12/11/2011)   History of stomach ulcers    Hypotension    Iron deficiency anemia    New onset a-fib ([place])    10/15/14 EKG   Osteopenia    Peripheral neuropathy 11/2018   Pressure ulcer of sacral region, stage 1 07/2019   Seizures ([place]) 1994   "post transplant; only  have had that one" (12/11/2011)   Spinal stenosis in cervical region    Stroke [facility])     left basal ganglia lacunar infarct; Right frontal lobe lacunar infarct.   Stroke [facility]) ~ 1999; 2001   "briefly lost my vision; lost my right eye" (12/11/2011)   Vitamin D deficiency 12/2018   Past Surgical History:  Procedure Laterality Date   ANTERIOR CERVICAL DECOMP/DISCECTOMY FUSION N/A 01/08/2015   Procedure: Anterior Cervical Three-Four/Four-Five Decompression/Diskectomy/Fusion;  Surgeon: Leeroy Cha, MD;  Location: [place] NEURO ORS;  Service: Neurosurgery;  Laterality: N/A;  C3-4 C4-5 Anterior cervical decompression/diskectomy/fusion   APPENDECTOMY  ~ 2004   BIOPSY  09/12/2020   Procedure: BIOPSY;  Surgeon: Milus Banister, MD;  [facility];  Service: Endoscopy;;   CATARACT EXTRACTION     right eye   COLONOSCOPY     COLONOSCOPY WITH PROPOFOL N/A 09/12/2020   Procedure: COLONOSCOPY WITH PROPOFOL;  Surgeon: Milus Banister, MD;  [facility];  Service: Endoscopy;  Laterality: N/A;   ENUCLEATION  2001   "right"   ESOPHAGOGASTRODUODENOSCOPY (EGD) WITH PROPOFOL N/A 04/21/2012   Procedure: ESOPHAGOGASTRODUODENOSCOPY (EGD) WITH PROPOFOL;  Surgeon: Milus Banister, MD;  [facility];  Service: Endoscopy;  Laterality: N/A;   ESOPHAGOGASTRODUODENOSCOPY (EGD) WITH PROPOFOL N/A 09/12/2020   Procedure: ESOPHAGOGASTRODUODENOSCOPY (EGD) WITH PROPOFOL;  Surgeon: Milus Banister, MD;  [facility];  Service: Endoscopy;  Laterality: N/A;   INSERTION OF DIALYSIS CATHETER  1988   "AV graft LUA & LFA; LUA worked for 1 day; LFA never worked"   IR FLUORO GUIDE CV LINE RIGHT  07/11/2019   IR FLUORO GUIDE CV LINE RIGHT  10/20/2019   IR FLUORO GUIDE CV LINE RIGHT  05/31/2020   IR PTA VENOUS EXCEPT DIALYSIS CIRCUIT  05/31/2020   IR RADIOLOGY PERIPHERAL GUIDED IV START  07/11/2019   IR US GUIDE VASC ACCESS RIGHT  07/11/2019   IR US GUIDE VASC ACCESS RIGHT  07/11/2019   IR US GUIDE VASC  ACCESS RIGHT  07/11/2019   IR VENOCAVAGRAM IVC  05/31/2020   KIDNEY TRANSPLANT  1994; 1999; 2005   "right"   MULTIPLE TOOTH EXTRACTIONS     POLYPECTOMY  09/12/2020   Procedure: POLYPECTOMY;  Surgeon: Milus Banister, MD;  [facility];  Service: Endoscopy;;   RIGHT HEART CATH N/A 03/23/2019   Procedure: RIGHT HEART CATH;  Surgeon: Jolaine Artist, MD;  Location: [place] CV LAB;  Service: Cardiovascular;  Laterality: N/A;   [place]?; 1994; 2005   Patient Active Problem List   Diagnosis Date Noted   Bilateral hip pain 01/14/2022   Prolonged Q-T interval on ECG 09/07/2021   Nausea & vomiting 09/06/2021   Bilateral shoulder pain 06/06/2021   Bilateral hand swelling 06/06/2021   Cervical radiculitis 01/24/2021   Acute respiratory failure with hypoxia ([place]) 10/23/2020   Coagulation defect, unspecified ([place]) 08/01/2020   Aortic atherosclerosis ([place]) 07/10/2020   Secondary hypercoagulable state ([place]) 04/23/2020   Atrial flutter ([place]) 03/29/2020   Chronic pain of both knees 03/13/2020   Chills (without fever) 12/12/2019   Other pancytopenia ([place])    Decubitus ulcer of sacral region, stage 1 08/18/2019   HCAP (healthcare-associated pneumonia) 08/05/2019   Pressure injury of skin 07/24/2019   Chest congestion 07/24/2019   Itching 07/24/2019   Weakness 06/13/2019   Pneumonia due to COVID-19 virus 05/09/2019   Hypotension 05/09/2019   Symptomatic anemia 05/09/2019   CHF (congestive heart failure) ([place]) 05/09/2019   Swollen abdomen 01/04/2019   DDD (degenerative disc disease), cervical 12/06/2018   Neuropathy 12/06/2018   Paresthesia 10/11/2018   Neck pain 10/11/2018   Tobacco abuse 11/20/2017   Right leg swelling 11/20/2017   Right leg pain 11/20/2017   Stroke (cerebrum) ([place]) 11/20/2017   GERD (gastroesophageal reflux disease) 11/20/2017   Acute venous embolism and thrombosis of deep vessels of proximal lower extremity, right ([place])  11/20/2017   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    Thrush, oral    AKI (acute kidney injury) ([place])    Multifocal pneumonia 08/09/2017   ETD (Eustachian tube dysfunction), bilateral 08/03/2017   Acute recurrent pansinusitis 07/21/2017   Chest pain 09/29/2015   Cervical stenosis of spinal canal 01/08/2015   Urinary tract infectious disease    Muscle spasms of neck 06/15/2014   Bleeding hemorrhoid 06/15/2014   Anemia in chronic kidney disease 06/15/2014   Pyelonephritis, acute 06/13/2014   Sepsis ([place]) 06/13/2014   History of kidney transplant    Abnormal CT scan 09/14/2012   Chronic pain syndrome 04/09/2012   Dehydration, mild 04/09/2012   Herpes infection 06/23/2011  Anxiety 06/23/2011   E coli bacteremia 06/18/2011   ESRD (end stage renal disease) ([place]) 06/12/2011   UTI (lower urinary tract infection) 06/12/2011   Bacteremia due to Gram-negative bacteria 05/23/2011   History of renal transplantation 05/22/2011   Septic shock(785.52) 05/22/2011   Acute on chronic kidney failure ([place]) 05/22/2011   Gastroparesis 04/24/2008   WEIGHT LOSS 08/24/2007   NAUSEA AND VOMITING 08/24/2007    THERAPY DIAG:  Chronic left shoulder pain  Muscle weakness (generalized)  Chronic right shoulder pain   Rationale for Evaluation and Treatment Rehabilitation  REFERRING DIAG:  Bil shoulder adhesive capuslitis   PERTINENT HISTORY: Hx of stroke, DVT, CHF, Afib, Neuropathy, neck pain, gout, DDD, ESRD, cervical stenosis, dialysis (T, Th, sat), kidney transplant (failed)      PRECAUTIONS/RESTRICTIONS:   see pertinent history   SUBJECTIVE:  Patient reports increased pain today and that she had trouble sleeping last night.  Pain:  Are you having pain? Yes Pain location: diffuse shoulder pain R>L NPRS scale:  current 8/10  Aggravating factors: reaching, lifting, activity Relieving factors: rest Pain description: constant and aching Stage:  Chronic 24 hour pattern: worse with activity   OBJECTIVE: (objective measures completed at initial evaluation unless otherwise dated)  ASTERISK SIGNS     Asterisk Signs Eval (12/10/2021)  1/17  11/29  12/20 12/20  1/3   Functional ER To ear bil            Flexion  R 6.2 lbs, L 7.5 lbs            Comb hair unable   unable  Able to reach sides of head but not back        flexion   L 90 R 100      Passive  L 130  R 130  Active L 100 R 90                   TREATMENT 02/18/22: Therapeutic Exercise: - nu-step - L5 - 8' - 300 steps - chest press (reclined) - 0# - 3x10 - Low row - 3x10 - YTB - shoulder ext - YTB - 3x10 Modalities: 10 min MHP following session, pt in reclined position on table  TREATMENT 1/12:  Therapeutic Exercise: - nu-step - L5 - 8.5' - 500 steps - chest press (reclined) - 0# - 3x10 - Low row - 3x10 - YTB (NT) - shoulder ext - YTB - 2x10 (NT) - Shoulder isometric IR/ER in supine - 30x ea - supine shoulder ER - GTB - x10 ea  Manual Therapy: - ER and flexion ROM with gentle oscillations at end range  10 min heat following session   TREATMENT 11/29:  Therapeutic Exercise: - nu-step - L5 - 3' - 100 steps - dowel chest press (reclined) - 3x10 - Low row - 3x10 - 10# - high row - 3x10 - 5#  Manual Therapy: - ER and flexion ROM with gentle oscillations at end range  10 min heat following session  ASSESSMENT:   CLINICAL IMPRESSION: Patient presents to PT 14 minutes late, truncating session, reporting high levels of pain in BIL shoulders, R>L. Session today continued to focus on gentle strengthening and ROM for BIL shoulders. She remains limited by pain throughout session. Patient continues to benefit from skilled PT services and should be progressed as able to improve functional independence.    OBJECTIVE IMPAIRMENTS: Pain, shoulder ROM, shoulder strength   ACTIVITY LIMITATIONS: housework, lifting, reaching   PERSONAL FACTORS: See medical  history and  pertinent history     REHAB POTENTIAL: Fair chronic, recurring, complex medical history   CLINICAL DECISION MAKING: Stable/uncomplicated   EVALUATION COMPLEXITY: Low     GOALS:     SHORT TERM GOALS: Target date: 01/07/2022   Izabell will be >75% HEP compliant to improve carryover between sessions and facilitate independent management of condition   Evaluation (12/10/2021): ongoing Goal status: MET 01/07/22: Pt reports adherence      LONG TERM GOALS: Target date: 02/04/2022 (extended to 04/01/22)   Sandria Bales will show a >/= 15 pt improvement in her QUICK DASH score (MCID is 13.4 pts or 8%) as a proxy for functional improvement    Evaluation/Baseline (12/10/2021): 29 pts 02/04/22: QuickDASH Score: 63.6 / 100 = 63.6 % Goal status: Ongoing     2.  Dayann[place] will be able to comb her hair, not limited by pain or ROM   Evaluation/Baseline (12/10/2021): limited 02/04/2022: continues to be limited d/t pain Goal status: INITIAL     3.  Mar[place] will be able to reach to low shelf, not limited by pain or ROM   Evaluation/Baseline (12/10/2021): limited 1/3: able to reach first shelf with some pain Goal status: progressing     4.  B[place] will demonstrate >110 degrees of active ROM in flexion to allow completion of activities involving reaching OH, not limited by pain   Evaluation/Baseline (12/10/2021):    UPPER EXTREMITY AROM:   AROM/PROM Right 12/10/2021 Left 12/10/2021 R 1/3 L 1/3  Shoulder flexion 90/100 80/90 90/130 100/130  Shoulder abduction 70 70    Shoulder internal rotation        Shoulder external rotation        Functional IR PSIS PSIS    Functional ER ear ear    Shoulder extension        Elbow extension        Elbow flexion          (Blank rows = not tested, N = WNL, * = concordant pain with testing)   Goal status: INITIAL       PLAN: PT FREQUENCY: 1-2x/week   PT DURATION: 8 weeks (Ending 04/01/2022)   PLANNED INTERVENTIONS: Therapeutic exercises, Aquatic  therapy, Therapeutic activity, Neuro Muscular re-education, Gait training, Patient/Family education, Joint mobilization, Dry Needling, Electrical stimulation, Spinal mobilization and/or manipulation, Moist heat, Taping, Vasopneumatic device, Ionotophoresis '4mg'$ /ml Dexamethasone, and Manual therapy   PLAN FOR NEXT SESSION: progressive shoulder strengthening, MT for bil shoulder capsules, ROM progression   Margarette Canada PTA 02/18/2022, 10:45 AM

## 2022-02-24 IMAGING — MG DIGITAL SCREENING BILAT W/ TOMO W/ CAD
8 series · 9 of 24 positions shown · non-contrast
Comparison: Previous exam(s).

CLINICAL DATA: Screening.

EXAM:
DIGITAL SCREENING BILATERAL MAMMOGRAM WITH TOMO AND CAD

[L MLO synth-2D]
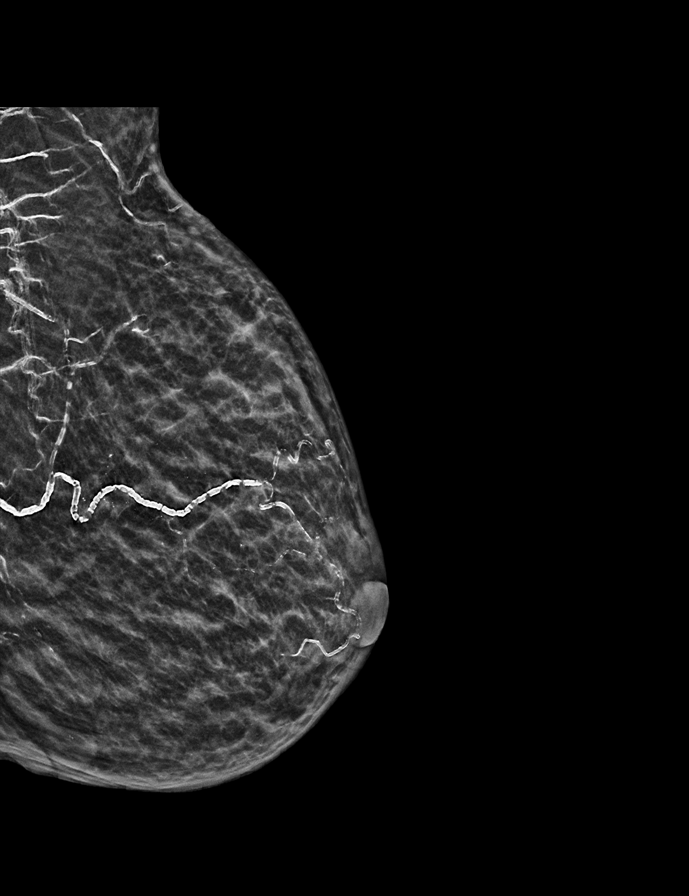

[R CC synth-2D]
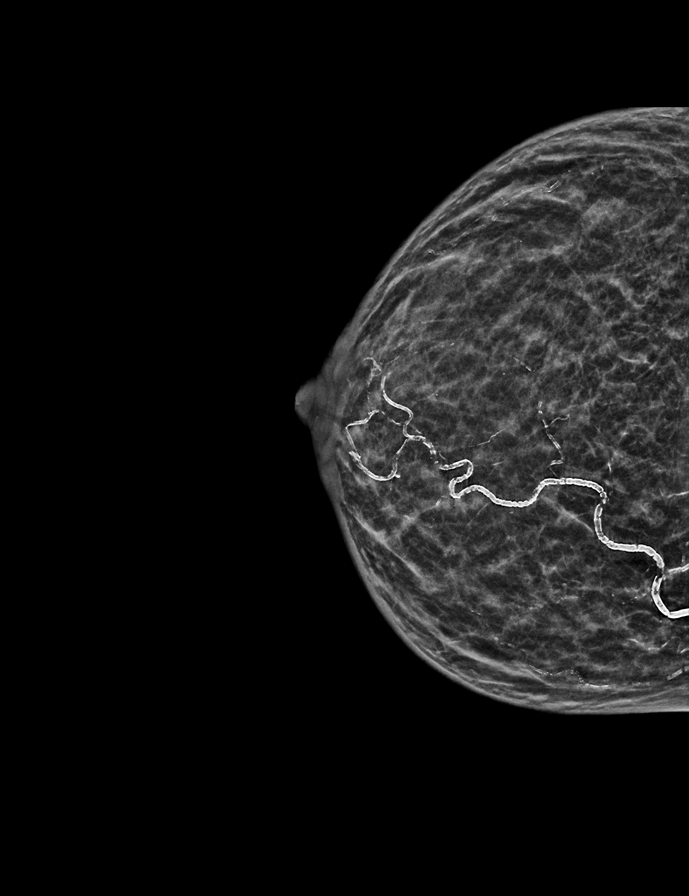

[L CC synth-2D]
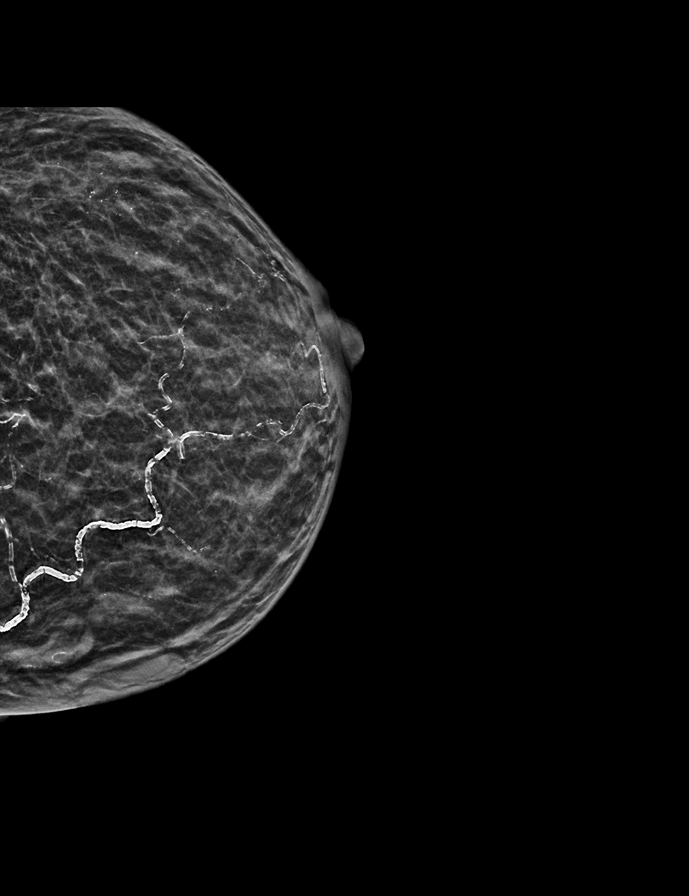

[R MLO synth-2D]
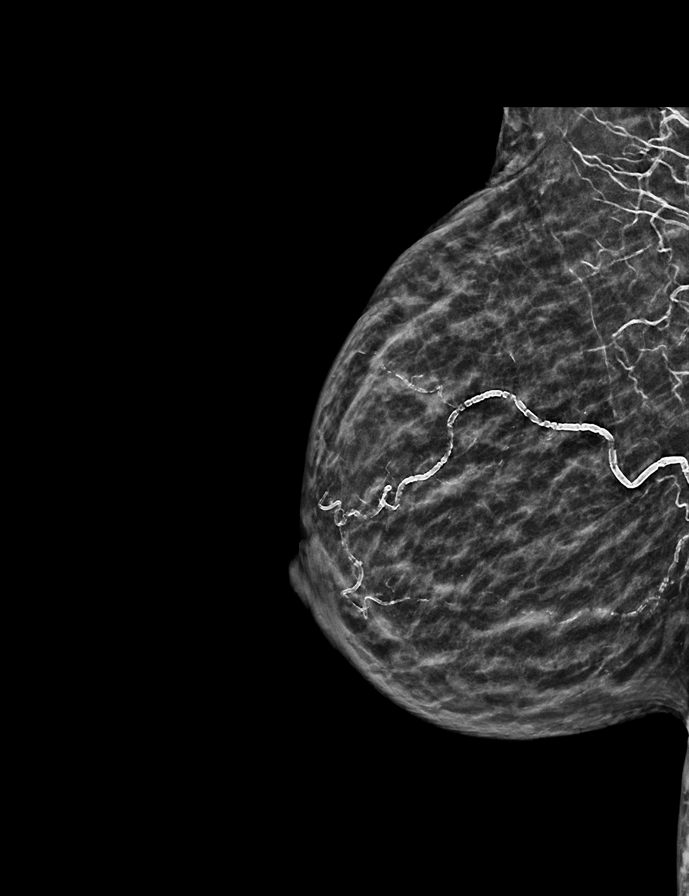

[L CC tomo · 2 of 30 frames shown]
[frame 10/30]
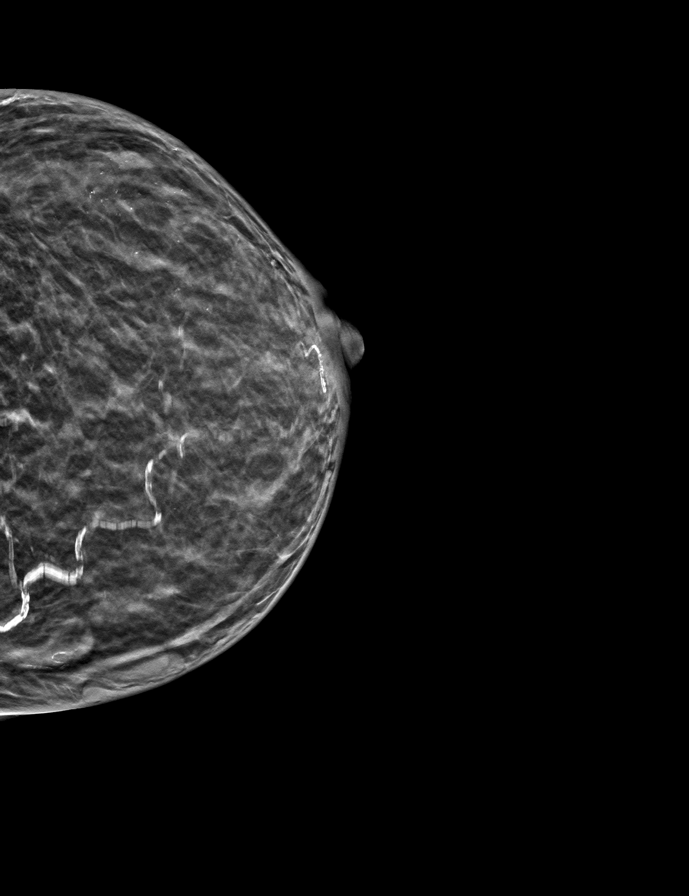
[frame 15/30]
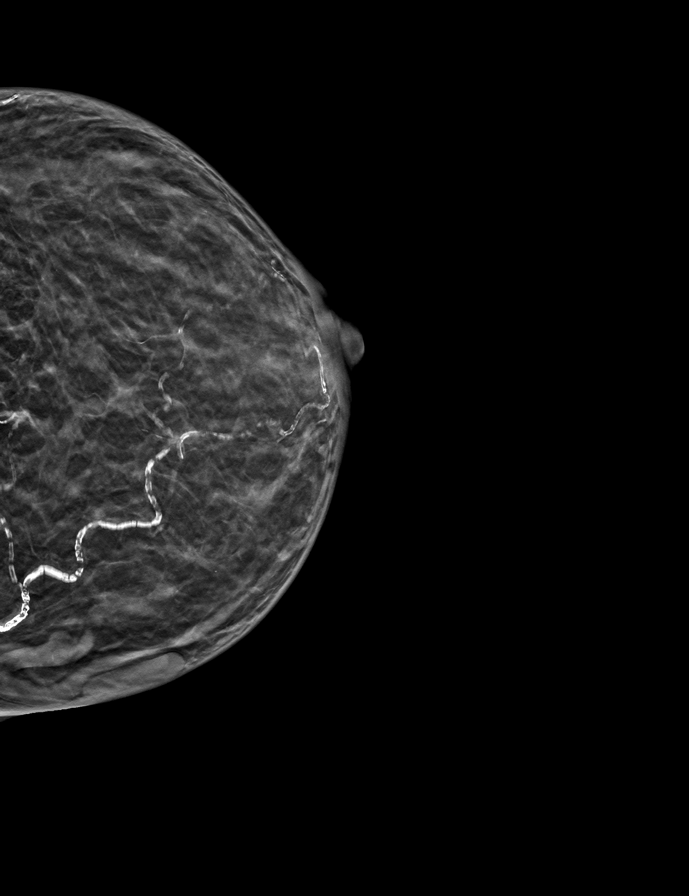

[L MLO tomo · tomo slice 17/33.0]
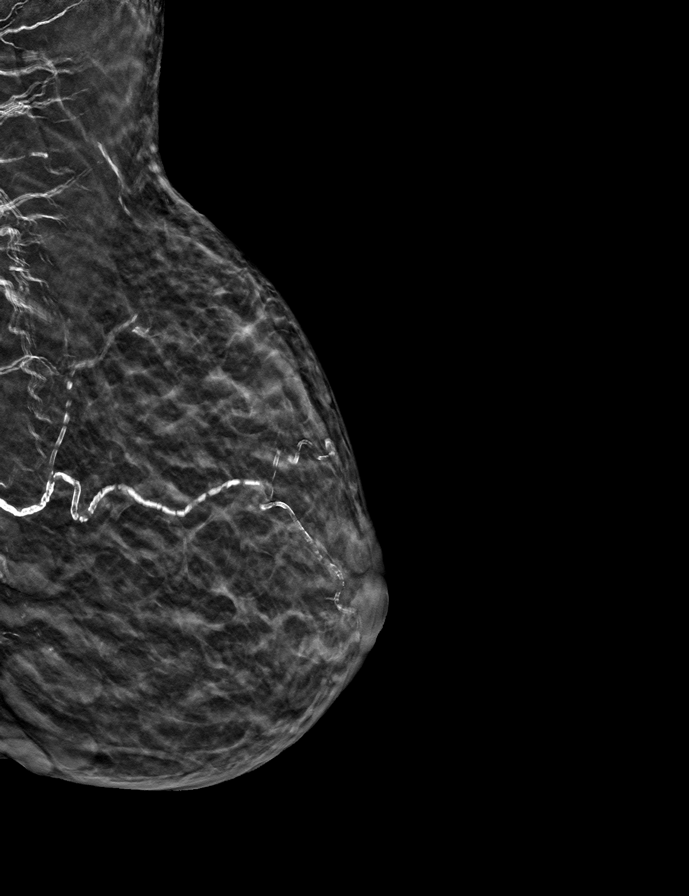

[R CC tomo · tomo slice 19/36.0]
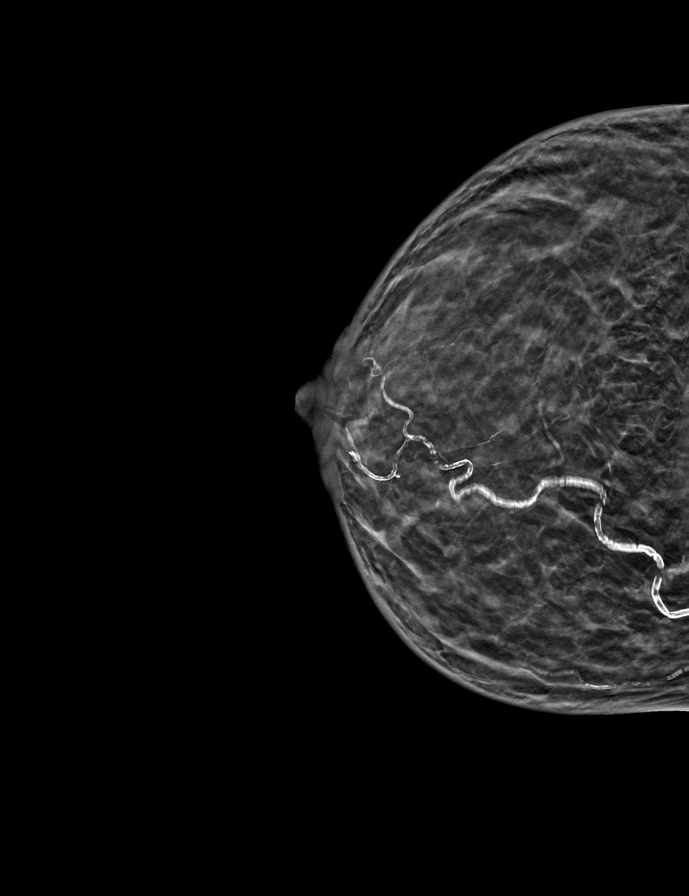

[R MLO tomo · tomo slice 21/42.0]
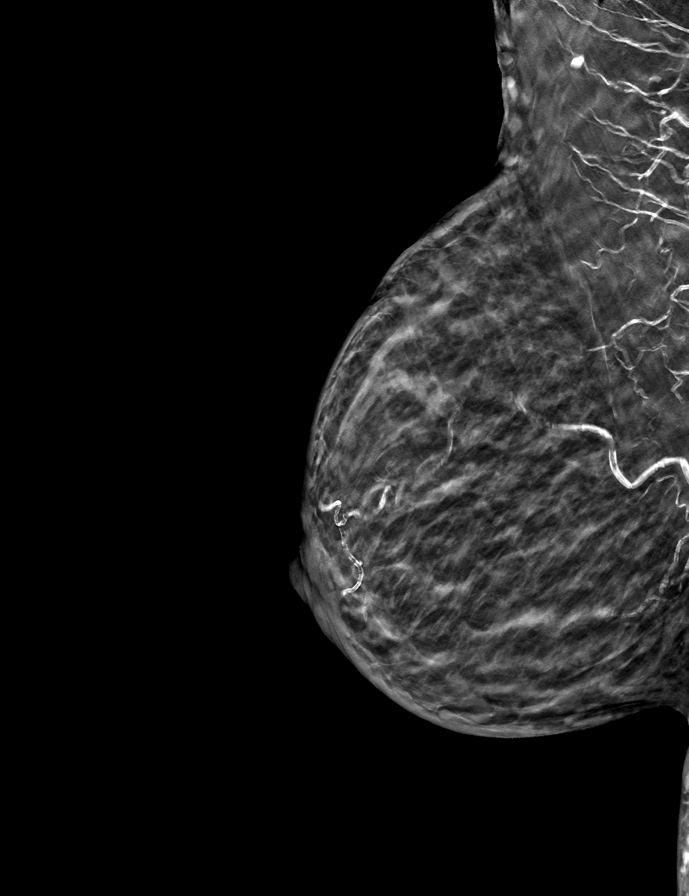

[9 of 24 positions shown; findings below may reference images not displayed]

ACR Breast Density Category c: The breast tissue is heterogeneously
dense, which may obscure small masses.
FINDINGS: There are no findings suspicious for malignancy. Images were
processed with CAD.
IMPRESSION: No mammographic evidence of malignancy. A result letter of this
screening mammogram will be mailed directly to the patient.

RECOMMENDATION:
Screening mammogram in one year. (Code:[id number])

BI-RADS CATEGORY  1: Negative.

## 2022-02-25 ENCOUNTER — Ambulatory Visit: Payer: Medicare Other | Admitting: Physical Therapy

## 2022-02-27 ENCOUNTER — Encounter: Payer: Self-pay | Admitting: Physical Therapy

## 2022-02-27 ENCOUNTER — Ambulatory Visit: Payer: Medicare Other | Admitting: Physical Therapy

## 2022-02-27 DIAGNOSIS — G8929 Other chronic pain: Secondary | ICD-10-CM

## 2022-02-27 DIAGNOSIS — M25612 Stiffness of left shoulder, not elsewhere classified: Secondary | ICD-10-CM

## 2022-02-27 DIAGNOSIS — M6281 Muscle weakness (generalized): Secondary | ICD-10-CM

## 2022-02-27 NOTE — Therapy (Signed)
Pt arrived to treatment stating that she felt very ill from her gastroparesis and had thrown-up on the way to the clinic, but didn't want to miss.  She reported she still felt very ill.  I told her we could hold off until her appt next week; she agrees to plan.

## 2022-02-28 IMAGING — CR DG CHEST 2V
2 series · 2 of 2 positions shown · non-contrast
Comparison: Chest x-ray dated August 05, 2019.

CLINICAL DATA: Severe shortness of breath.  Weakness.

EXAM:
CHEST - 2 VIEW

[chest lat]
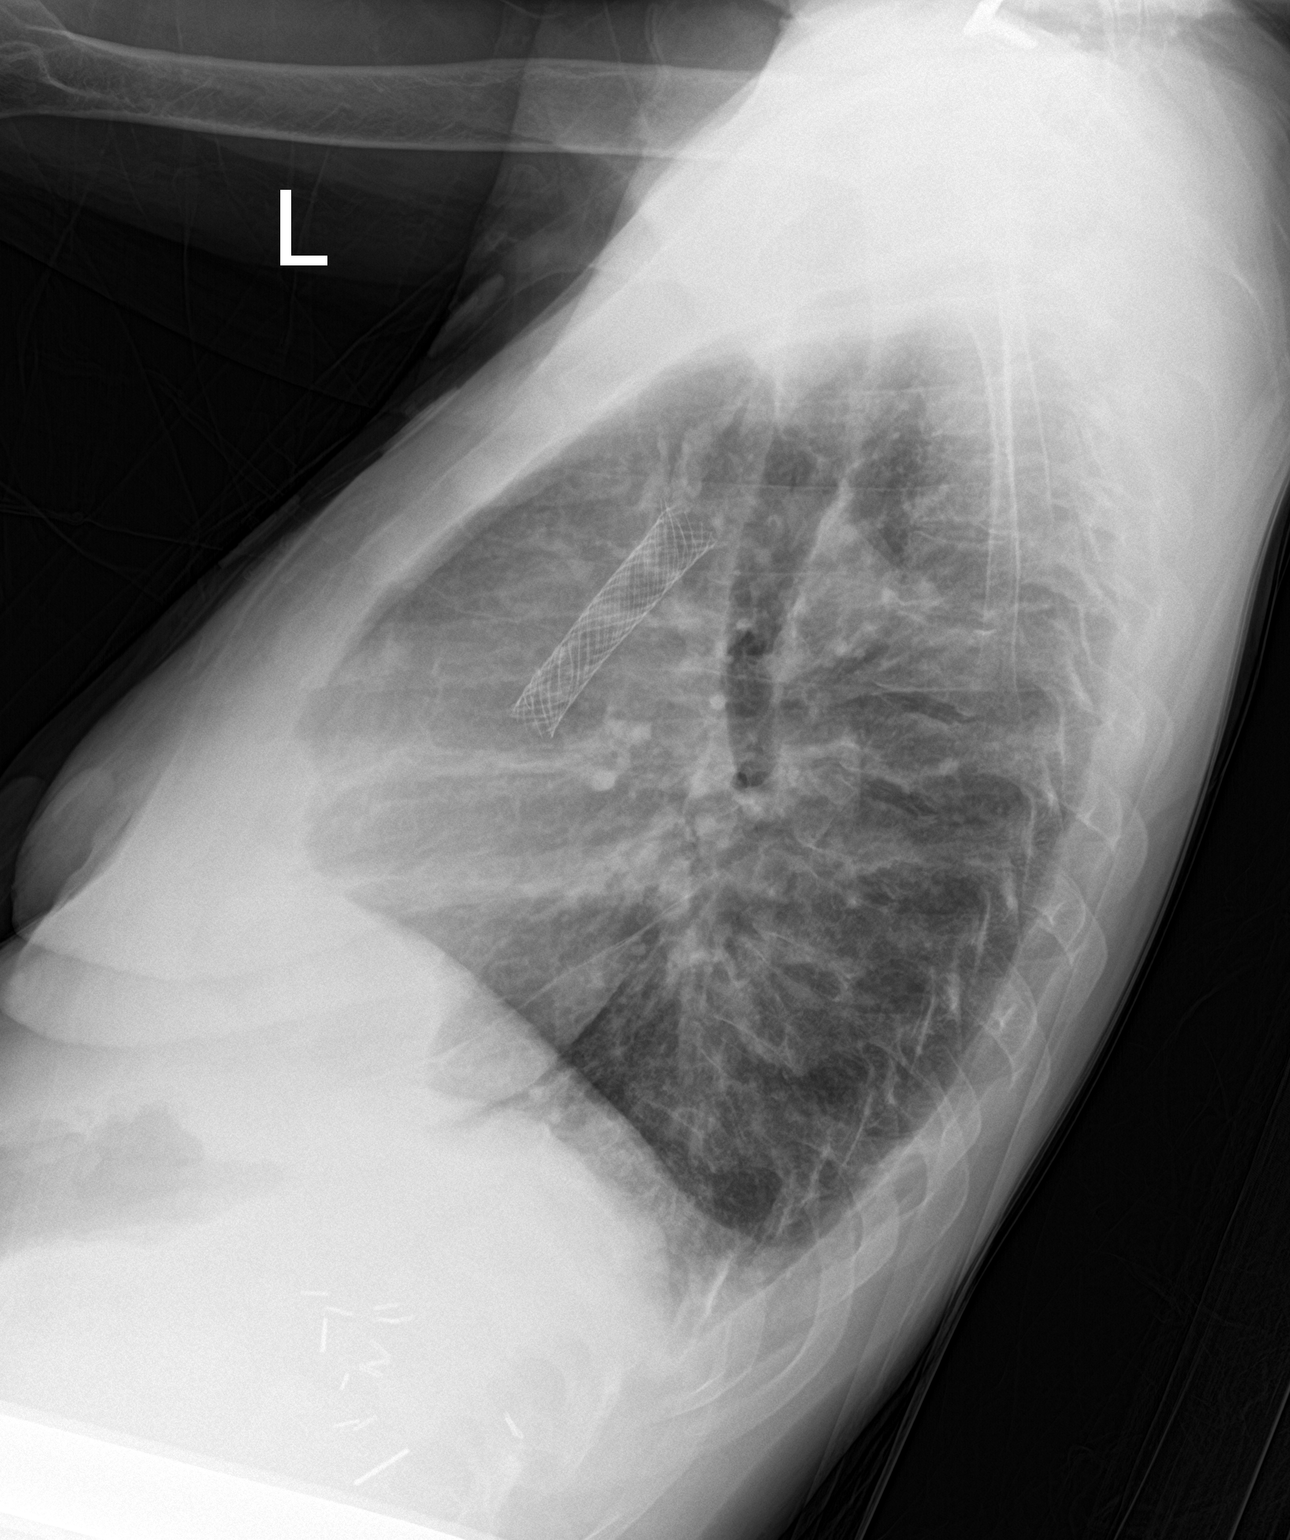

[chest ap]
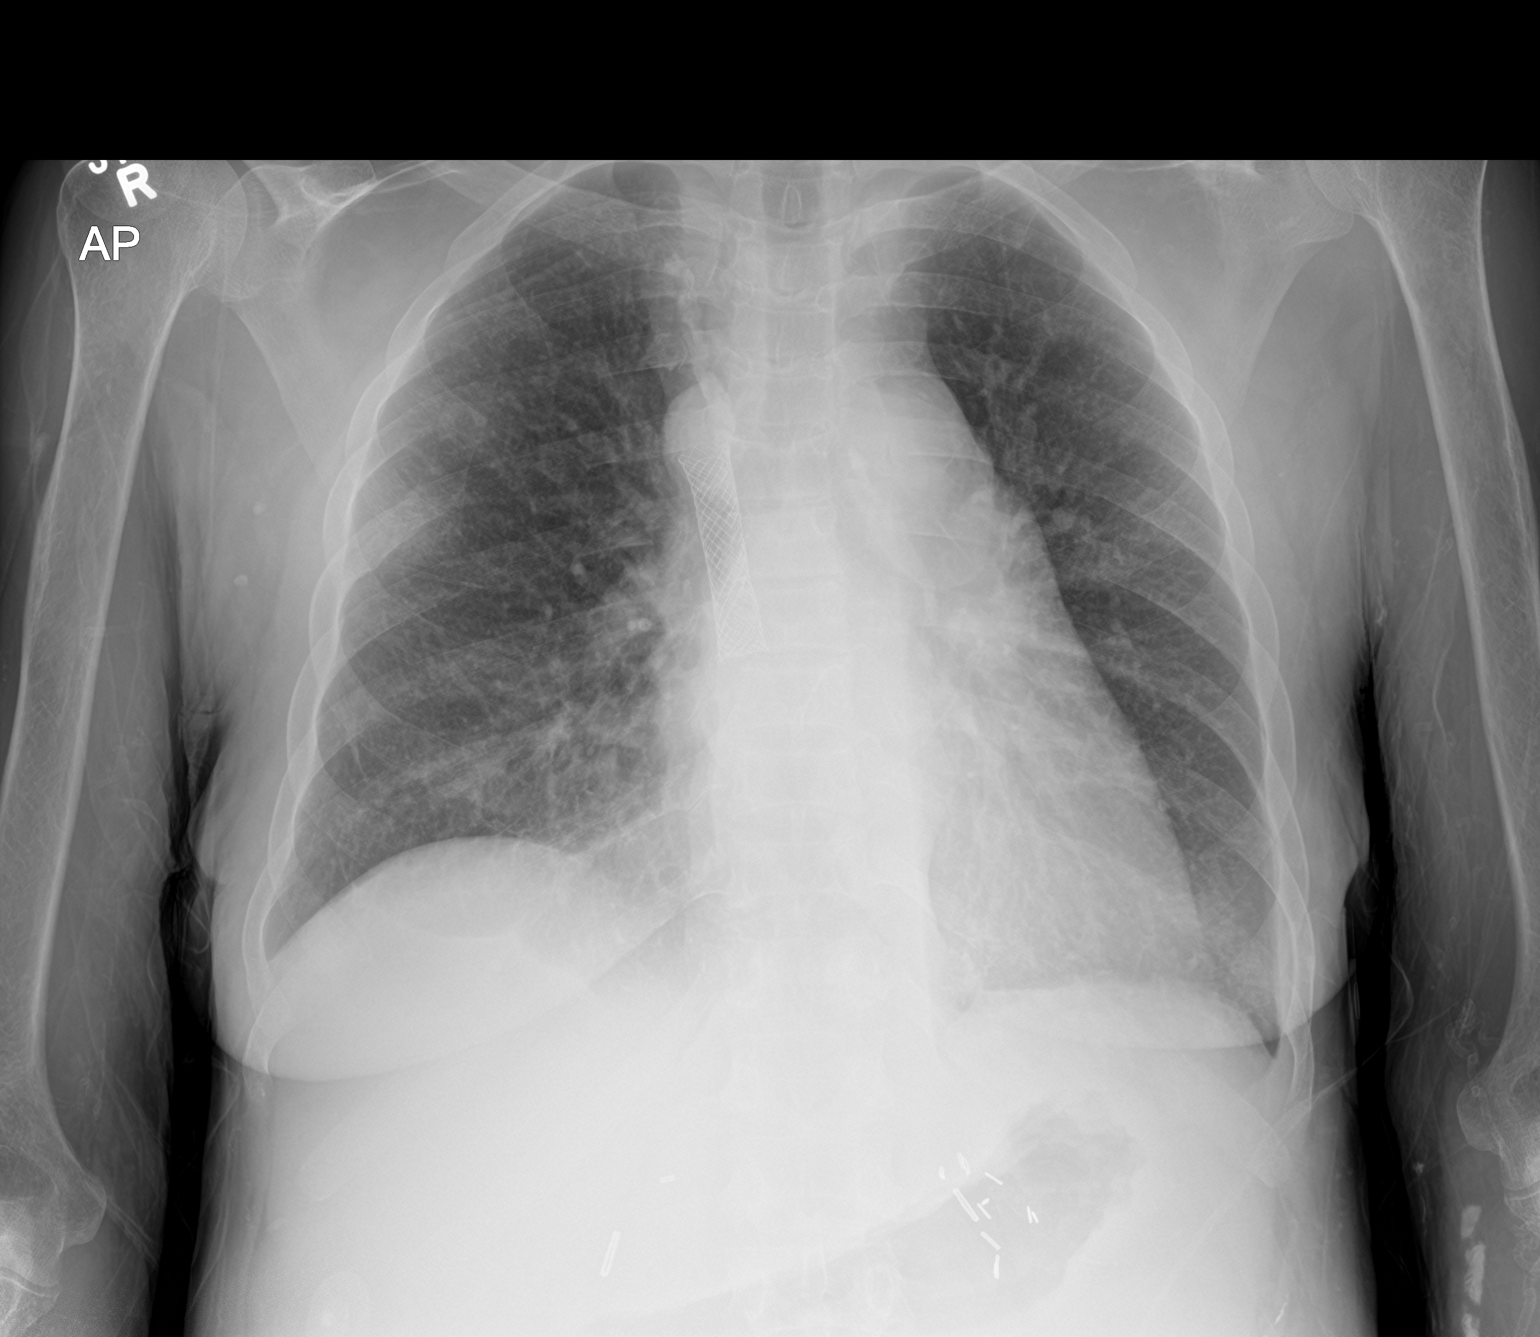

[2 of 2 positions shown; findings below may reference images not displayed]

FINDINGS: Normal heart size. Unchanged SVC stent. Unchanged pulmonary vascular
congestion. Trace right pleural effusion. No consolidation or
pneumothorax. No acute osseous abnormality.
IMPRESSION: 1. Unchanged pulmonary vascular congestion. New trace right pleural
effusion.

## 2022-03-01 IMAGING — CR DG KNEE COMPLETE 4+V*R*
4 series · 4 of 4 positions shown · non-contrast
Comparison: None.

CLINICAL DATA: Right worse than left knee pain.  No known injury.

EXAM:
RIGHT KNEE - COMPLETE 4+ VIEW

[t knee ap right]
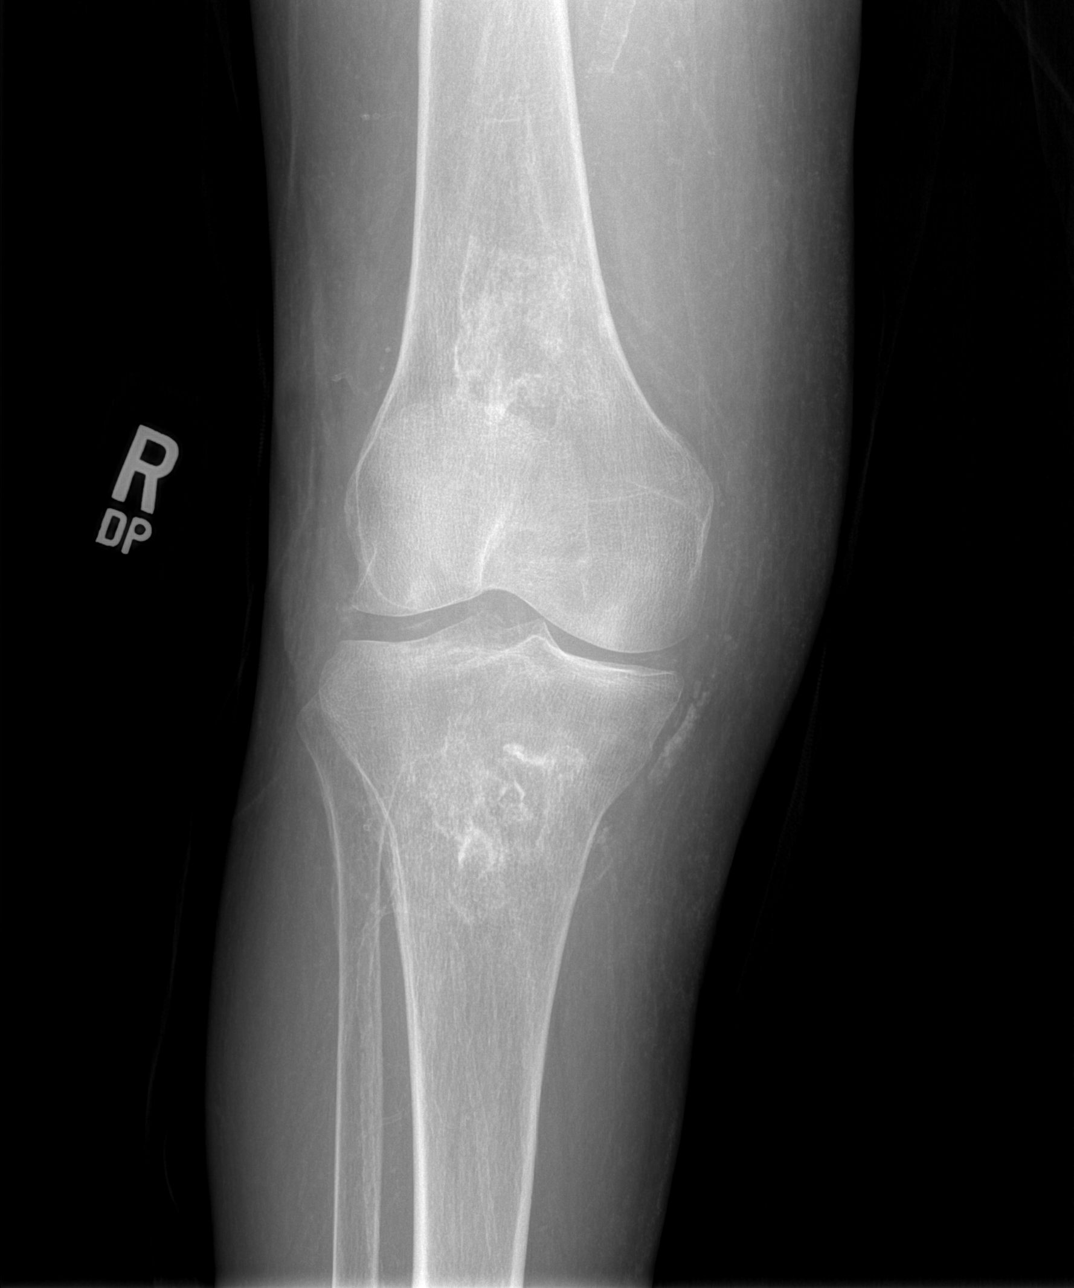

[t knee oblique right (1 of 2)]
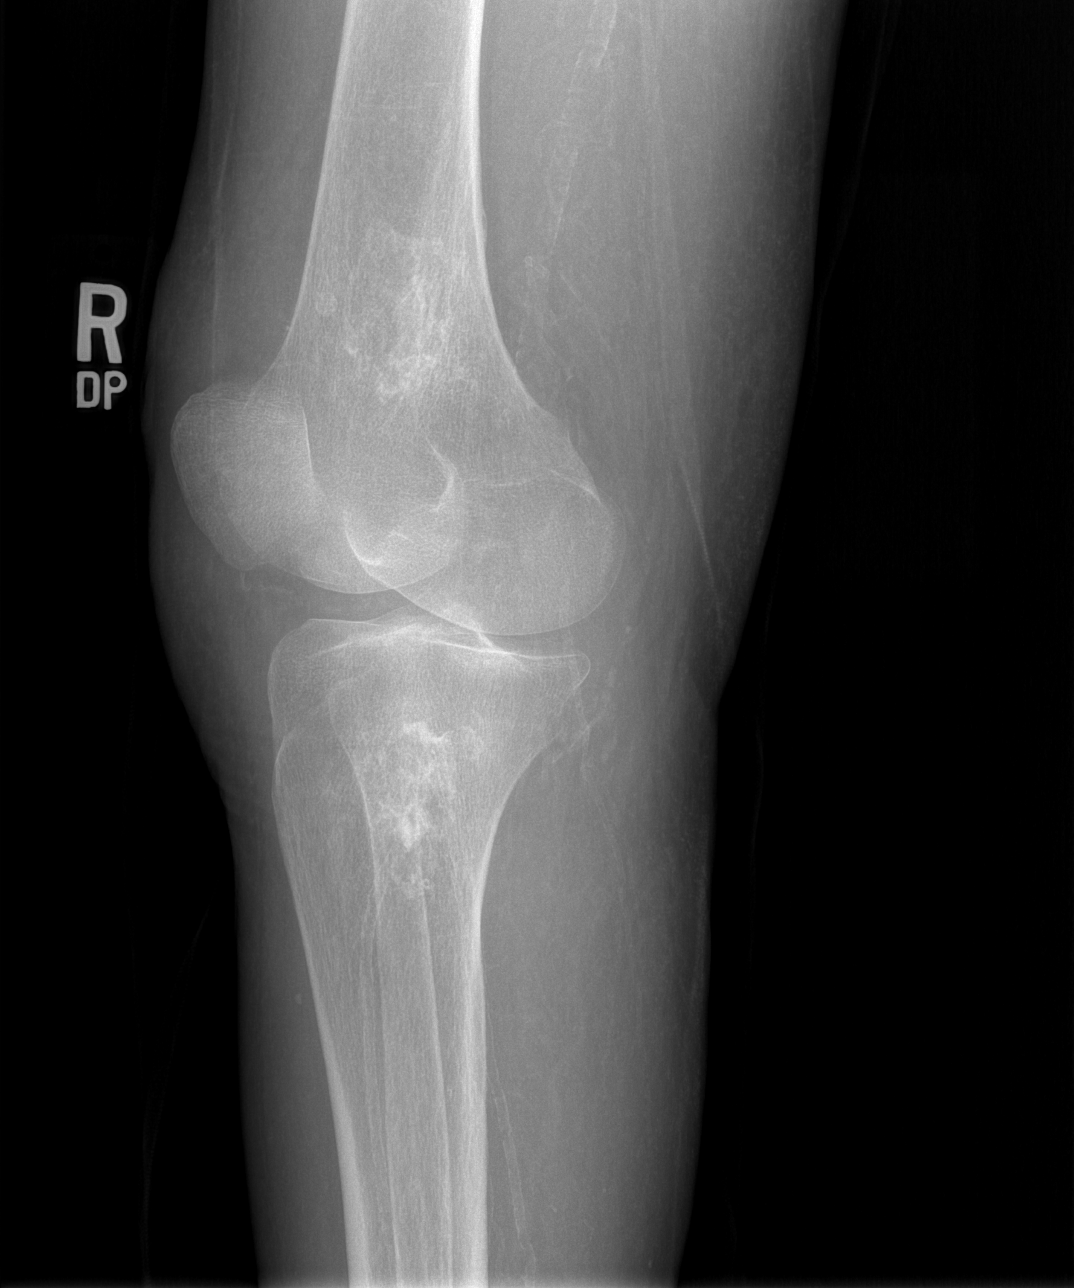

[t knee oblique right (2 of 2)]
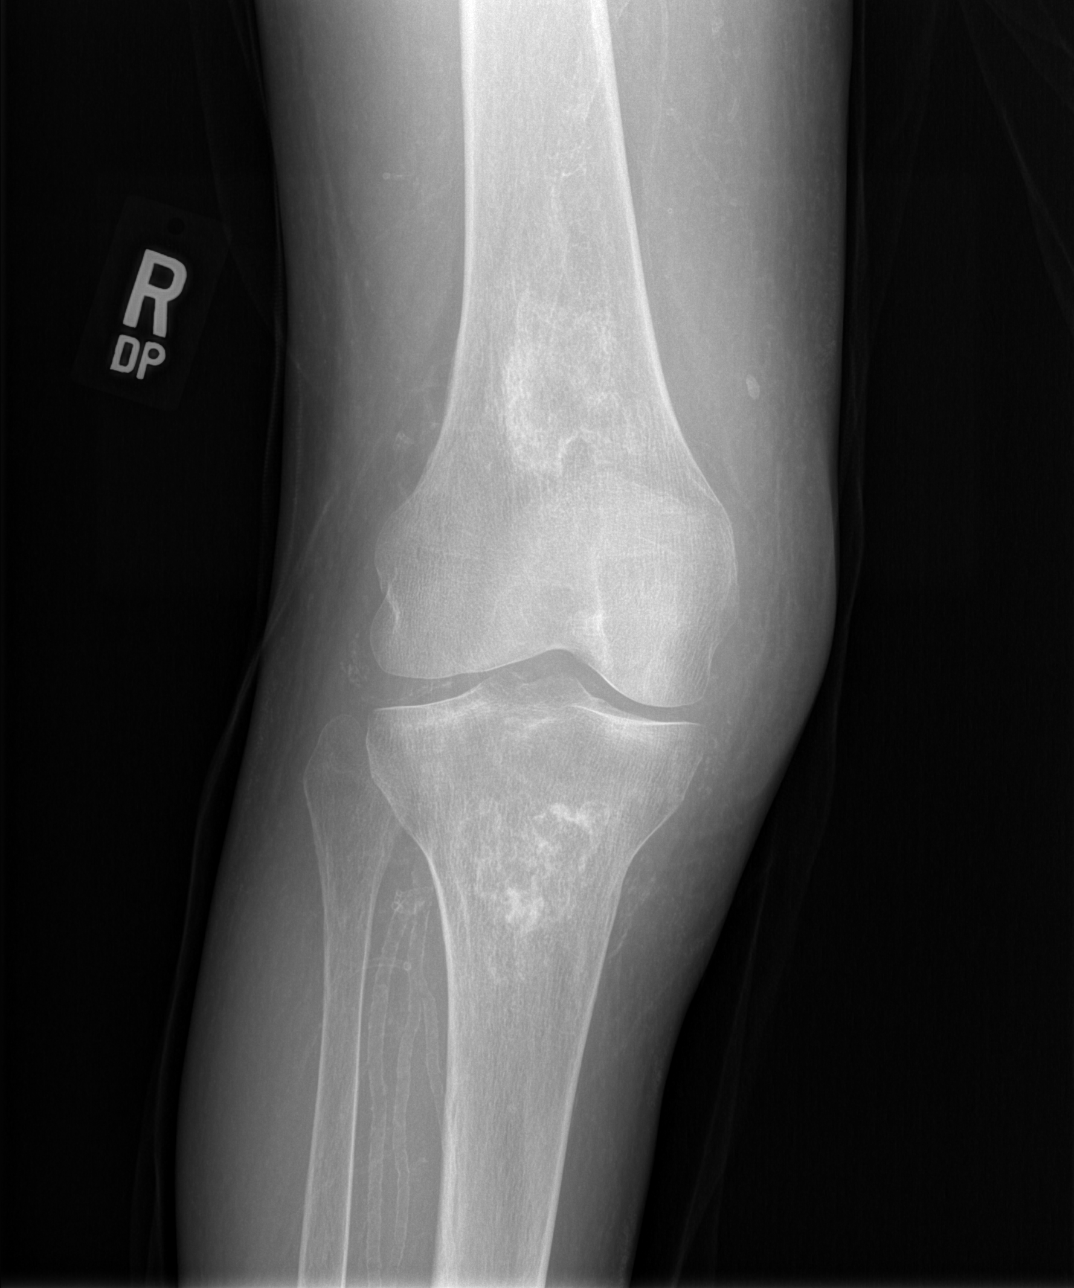

[t knee lat right]
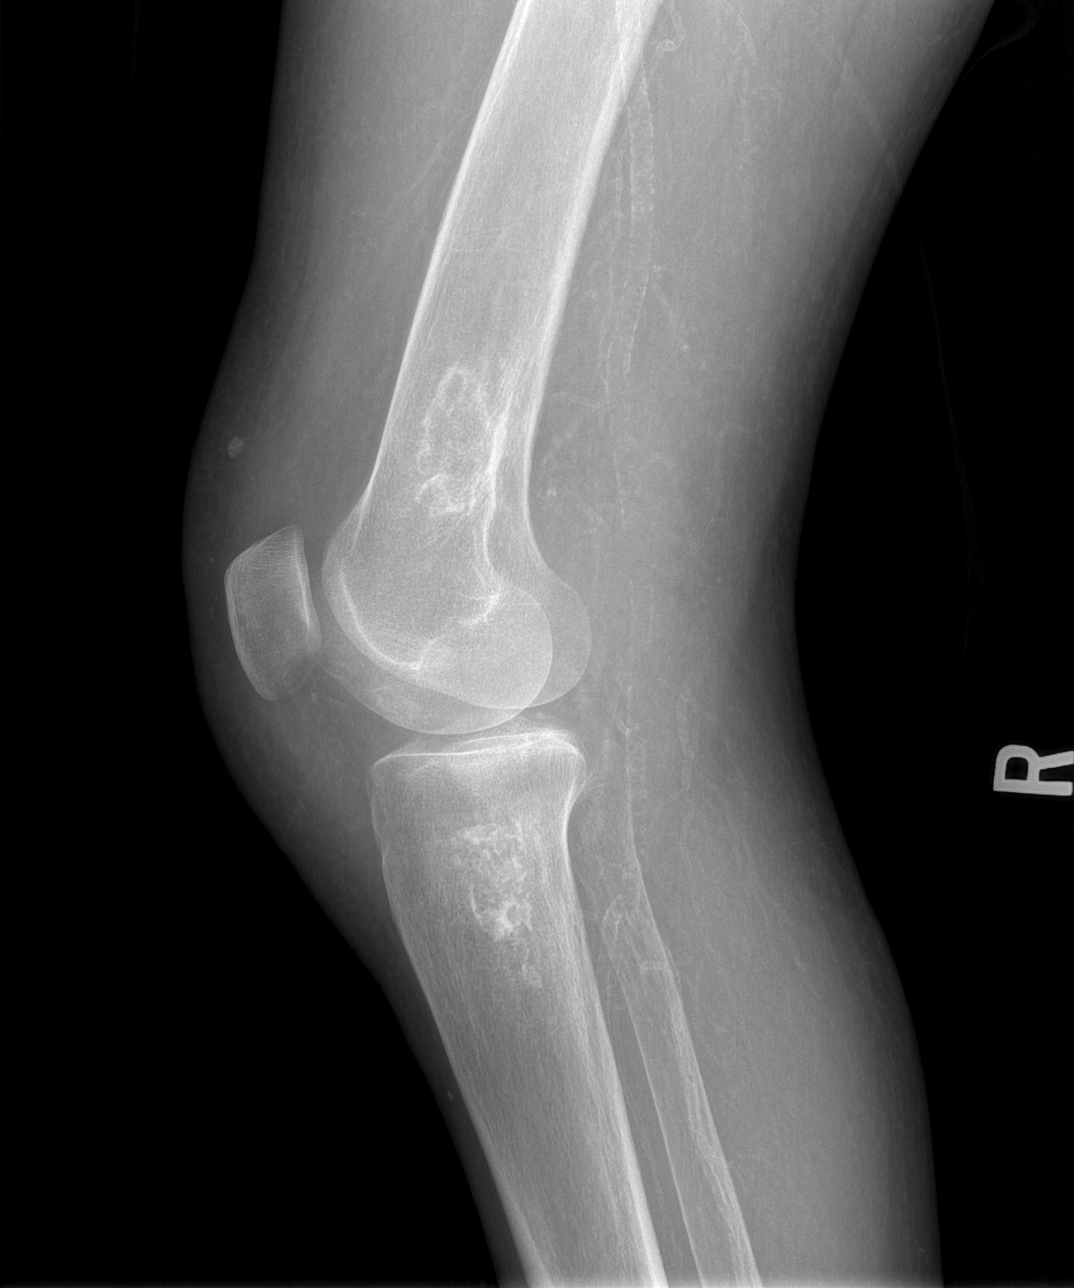

[4 of 4 positions shown; findings below may reference images not displayed]

FINDINGS: There is no acute bony or joint abnormality on the right or left.
Medullary infarcts are seen in the distal femurs and proximal tibias
bilaterally. No worrisome bony lesion is identified. Joint spaces
are preserved. No joint effusion. Chondrocalcinosis of the menisci
is more conspicuous on the right. Soft tissues demonstrate extensive
atherosclerosis.
IMPRESSION: No acute abnormality.

Medullary infarcts in the femurs and tibias bilaterally.

Chondrocalcinosis more notable on the right.

## 2022-03-01 IMAGING — CR DG KNEE COMPLETE 4+V*L*
4 series · 4 of 4 positions shown · non-contrast
Comparison: None.

CLINICAL DATA: Bilateral knee pain right worse left.

EXAM:
LEFT KNEE - COMPLETE 4+ VIEW

[t knee ap left]
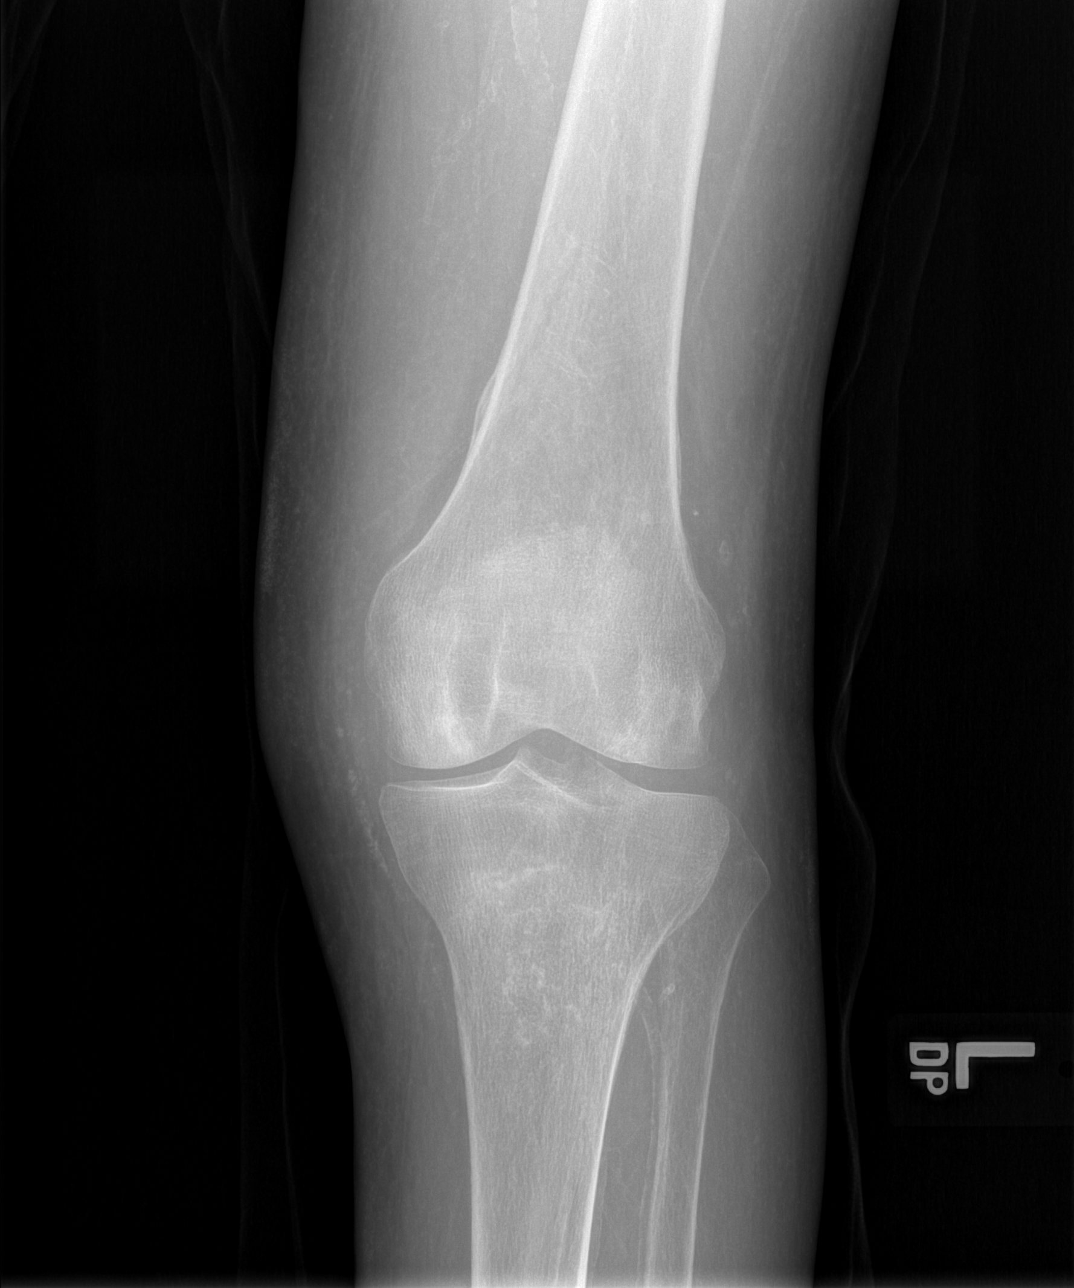

[t knee oblique left (1 of 2)]
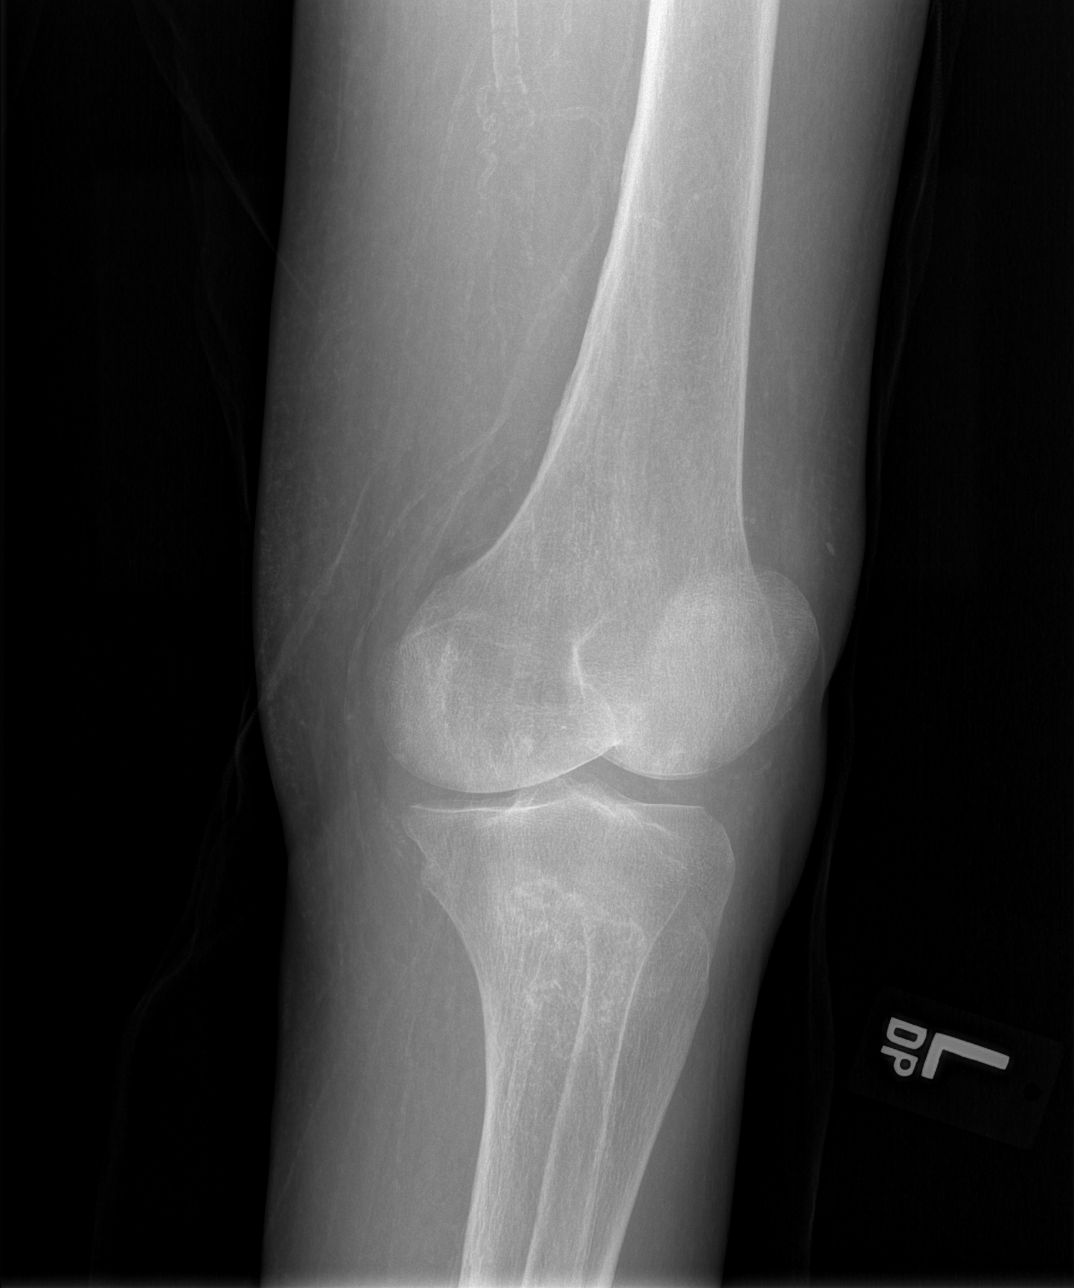

[t knee oblique left (2 of 2)]
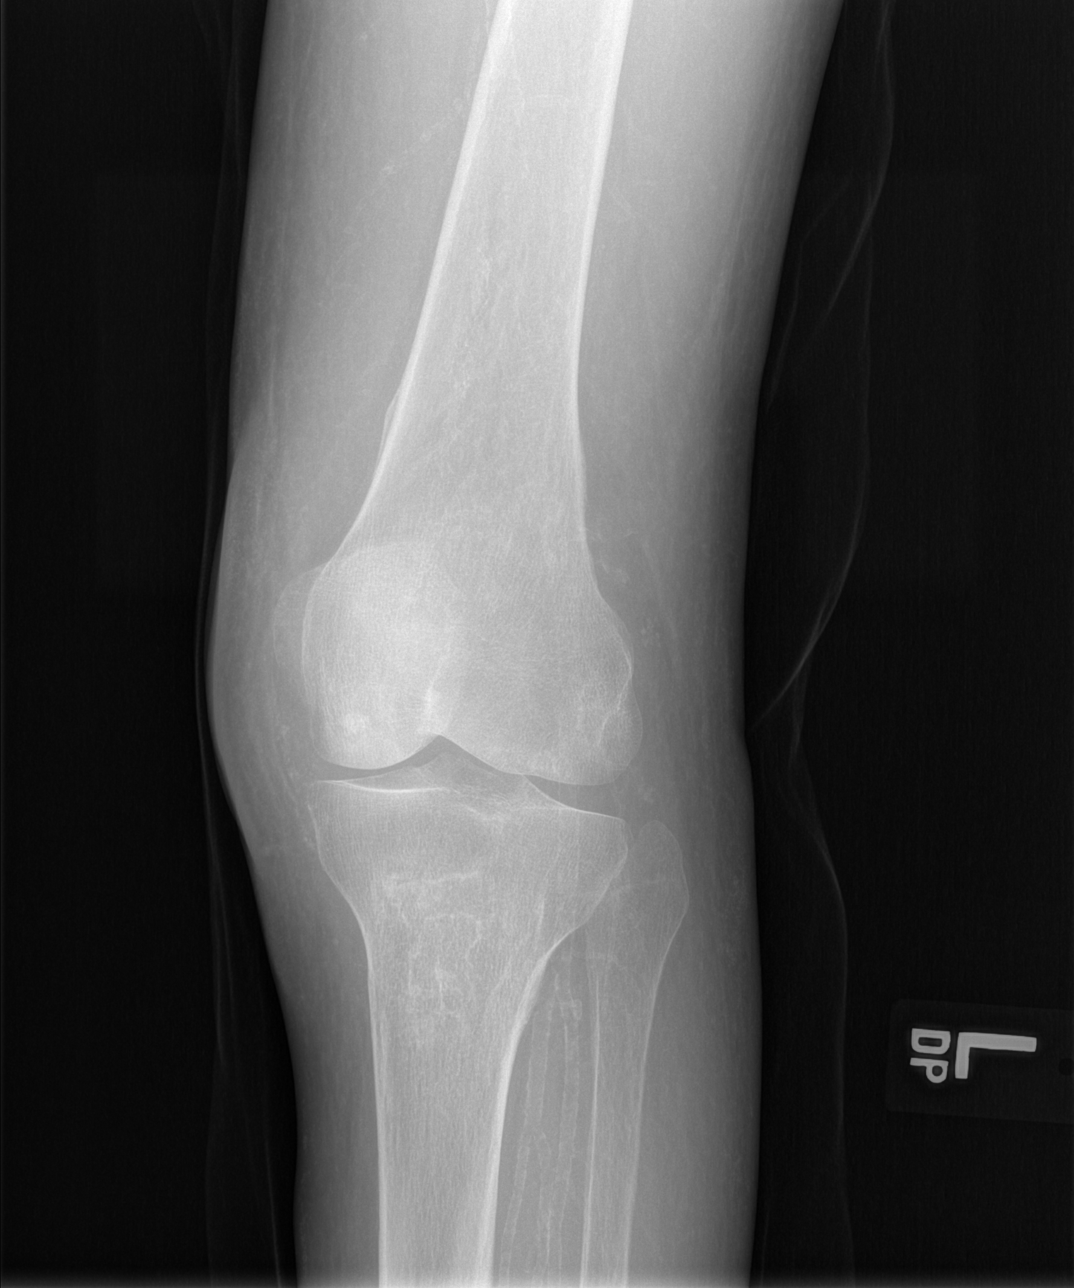

[t knee lat left]
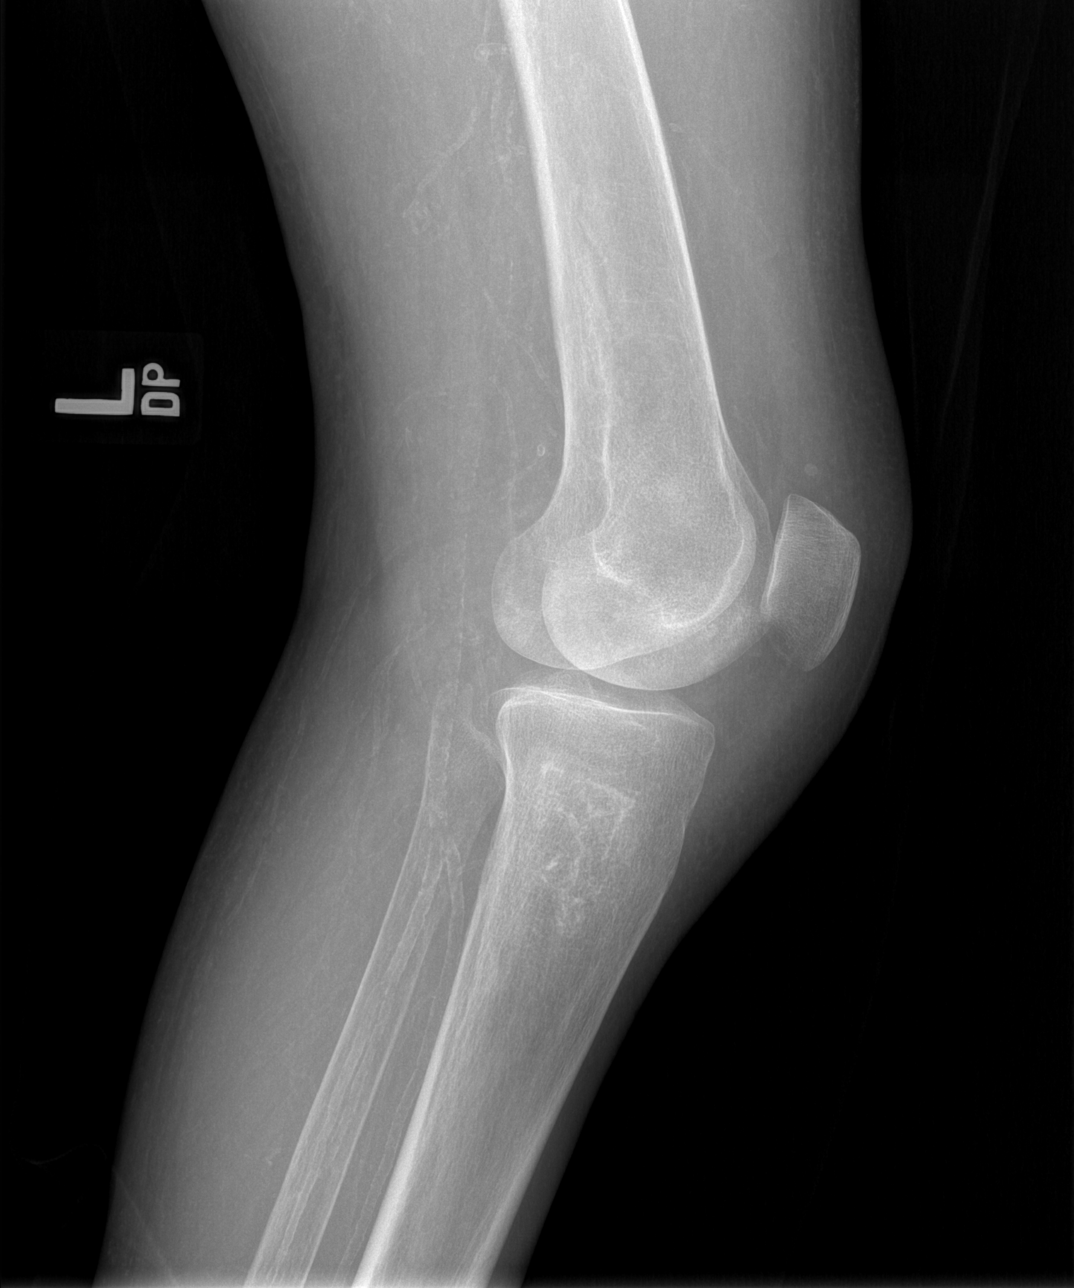

[4 of 4 positions shown; findings below may reference images not displayed]

FINDINGS: No visible joint effusion. No weight-bearing compartment joint space
narrowing. No evidence of regional fracture. Patellofemoral joint
appears normal. Extensive regional arterial calcification consistent
with chronic atherosclerotic vascular disease. Small focus of
periosteal elevation of the medial cortex of the medial femoral
metaphysis, significance doubtful. The underlying bone this not show
any lucent change. Mild speckled density in the proximal tibial
medullary space likely relating to minor old bone infarction. If
concern persists, MRI could be considered.
IMPRESSION: 1. No acute finding. No weight-bearing compartment joint space
narrowing.
2. Extensive regional arterial calcification consistent with chronic
atherosclerotic vascular disease.
3. Probable old bone infarction in the proximal tibia. Small focus
of periosteal elevation of the medial tibial metaphysis which may be
chronic and insignificant, but if concern persists, MRI could be
considered. No evidence of joint effusion or fracture.

## 2022-03-03 NOTE — Therapy (Signed)
Treatment note  Patient Name: Tina Watkins MRN: [id number] DOB:03-Feb-1966, 57 y.o., female Today's Date: 03/04/2022  PCP: Bo Merino I, NP   REFERRING PROVIDER: Cleophas Dunker, *   PT End of Session - 03/04/22 0915     Visit Number 11    Date for PT Re-Evaluation 04/01/22    Authorization Type MEDICARE PART A AND B; MEDICAID [place] ACCESS    Progress Note Due on Visit 49    PT Start Time 0915    PT Stop Time 1005    PT Time Calculation (min) 50 min    Activity Tolerance Patient tolerated treatment well;Patient limited by pain    Behavior During Therapy [facility] for tasks assessed/performed               Past Medical History:  Diagnosis Date   Bacteremia due to Gram-negative bacteria 05/23/2011   Blind    right eye   Blind right eye    CHF (congestive heart failure) ([place])    Chronic lower back pain    Complication of anesthesia    "woke up during OR; I have an extremely high tolerance" (12/11/2011) 1 procedure was graft; the other procedures were procedures that are typically done with sedation.   DDD (degenerative disc disease), cervical    Depression    Dysrhythmia    "tachycardia" (12/11/2011) new onset afib 10/15/14 EKG   E coli bacteremia 06/18/2011   Elevated LDL cholesterol level 12/2018   ESRD (end stage renal disease) ([place]) 06/12/2011   Fibromyalgia    Gastroparesis    Gastroparesis    GERD (gastroesophageal reflux disease)    Glaucoma    right eye   Gout    H/O carpal tunnel syndrome    Headache 10/2019   Headache(784.0)    "not often anymore" (12/11/2011)   Herpes genitalia 1994   History of blood transfusion    "more than a few times" (12/11/2011)   History of stomach ulcers    Hypotension    Iron deficiency anemia    New onset a-fib ([place])    10/15/14 EKG   Osteopenia    Peripheral neuropathy 11/2018   Pressure ulcer of sacral region, stage 1 07/2019   Seizures ([place]) 1994   "post transplant; only have had that one" (12/11/2011)    Spinal stenosis in cervical region    Stroke [facility] [address])     left basal ganglia lacunar infarct; Right frontal lobe lacunar infarct.   Stroke [facility]) ~ 1999; 2001   "briefly lost my vision; lost my right eye" (12/11/2011)   Vitamin D deficiency 12/2018   Past Surgical History:  Procedure Laterality Date   ANTERIOR CERVICAL DECOMP/DISCECTOMY FUSION N/A 01/08/2015   Procedure: Anterior Cervical Three-Four/Four-Five Decompression/Diskectomy/Fusion;  Surgeon: Leeroy Cha, MD;  Location: [place] NEURO ORS;  Service: Neurosurgery;  Laterality: N/A;  C3-4 C4-5 Anterior cervical decompression/diskectomy/fusion   APPENDECTOMY  ~ 2004   BIOPSY  09/12/2020   Procedure: BIOPSY;  Surgeon: Milus Banister, MD;  Location: [facility] ENDOSCOPY;  Service: Endoscopy;;   CATARACT EXTRACTION     right eye   COLONOSCOPY     COLONOSCOPY WITH PROPOFOL N/A 09/12/2020   Procedure: COLONOSCOPY WITH PROPOFOL;  Surgeon: Milus Banister, MD;  Location: [facility] ENDOSCOPY;  Service: Endoscopy;  Laterality: N/A;   ENUCLEATION  2001   "right"   ESOPHAGOGASTRODUODENOSCOPY (EGD) WITH PROPOFOL N/A 04/21/2012   Procedure: ESOPHAGOGASTRODUODENOSCOPY (EGD) WITH PROPOFOL;  Surgeon: Milus Banister, MD;  Location: [facility] ENDOSCOPY;  Service: Endoscopy;  Laterality: N/A;   ESOPHAGOGASTRODUODENOSCOPY (EGD) WITH PROPOFOL N/A 09/12/2020   Procedure: ESOPHAGOGASTRODUODENOSCOPY (EGD) WITH PROPOFOL;  Surgeon: Milus Banister, MD;  Location: [facility] ENDOS[facility];  Service: Endoscopy;  Laterality: N/A;   INSERTION OF DIALYSIS CATHETER  1988   "AV graft LUA & LFA; LUA worked for 1 day; LFA never worked"   IR FLUORO GUIDE CV LINE RIGHT  07/11/2019   IR FLUORO GUIDE CV LINE RIGHT  10/20/2019   IR FLUORO GUIDE CV LINE RIGHT  05/31/2020   IR PTA VENOUS EXCEPT DIALYSIS CIRCUIT  05/31/2020   IR RADIOLOGY PERIPHERAL GUIDED IV START  07/11/2019   IR US GUIDE VASC ACCESS RIGHT  07/11/2019   IR US GUIDE VASC ACCESS RIGHT  07/11/2019   IR US GUIDE VASC ACCESS RIGHT  07/11/2019   IR  VENOCAVAGRAM IVC  05/31/2020   KIDNEY TRANSPLANT  1994; 1999; 2005   "right"   MULTIPLE TOOTH EXTRACTIONS     POLYPECTOMY  09/12/2020   Procedure: POLYPECTOMY;  Surgeon: Milus Banister, MD;  Location: [facility] ENDOSCOPY;  Service: Endoscopy;;   RIGHT HEART CATH N/A 03/23/2019   Procedure: RIGHT HEART CATH;  Surgeon: Jolaine Artist, MD;  Location: [place] CV LAB;  Service: Cardiovascular;  Laterality: N/A;   [place]?; 1994; 2005   Patient Active Problem List   Diagnosis Date Noted   Bilateral hip pain 01/14/2022   Prolonged Q-T interval on ECG 09/07/2021   Nausea & vomiting 09/06/2021   Bilateral shoulder pain 06/06/2021   Bilateral hand swelling 06/06/2021   Cervical radiculitis 01/24/2021   Acute respiratory failure with hypoxia ([place]) 10/23/2020   Coagulation defect, unspecified ([place]) 08/01/2020   Aortic atherosclerosis ([place]) 07/10/2020   Secondary hypercoagulable state ([place]) 04/23/2020   Atrial flutter ([place]) 03/29/2020   Chronic pain of both knees 03/13/2020   Chills (without fever) 12/12/2019   Other pancytopenia ([place])    Decubitus ulcer of sacral region, stage 1 08/18/2019   HCAP (healthcare-associated pneumonia) 08/05/2019   Pressure injury of skin 07/24/2019   Chest congestion 07/24/2019   Itching 07/24/2019   Weakness 06/13/2019   Pneumonia due to COVID-19 virus 05/09/2019   Hypotension 05/09/2019   Symptomatic anemia 05/09/2019   CHF (congestive heart failure) ([place]) 05/09/2019   Swollen abdomen 01/04/2019   DDD (degenerative disc disease), cervical 12/06/2018   Neuropathy 12/06/2018   Paresthesia 10/11/2018   Neck pain 10/11/2018   Tobacco abuse 11/20/2017   Right leg swelling 11/20/2017   Right leg pain 11/20/2017   Stroke (cerebrum) ([place]) 11/20/2017   GERD (gastroesophageal reflux disease) 11/20/2017   Acute venous embolism and thrombosis of deep vessels of proximal lower extremity, right ([place]) 11/20/2017   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    Thrush, oral    AKI (acute kidney injury) ([place])    Multifocal pneumonia 08/09/2017   ETD (Eustachian tube dysfunction), bilateral 08/03/2017   Acute recurrent pansinusitis 07/21/2017   Chest pain 09/29/2015   Cervical stenosis of spinal canal 01/08/2015   Urinary tract infectious disease    Muscle spasms of neck 06/15/2014   Bleeding hemorrhoid 06/15/2014   Anemia in chronic kidney disease 06/15/2014   Pyelonephritis, acute 06/13/2014   Sepsis ([place]) 06/13/2014   History of kidney transplant    Abnormal CT scan 09/14/2012   Chronic pain syndrome 04/09/2012   Dehydration, mild 04/09/2012   Herpes infection 06/23/2011   Anxiety 06/23/2011   E  coli bacteremia 06/18/2011   ESRD (end stage renal disease) ([place]) 06/12/2011   UTI (lower urinary tract infection) 06/12/2011   Bacteremia due to Gram-negative bacteria 05/23/2011   History of renal transplantation 05/22/2011   Septic shock(785.52) 05/22/2011   Acute on chronic kidney failure ([place]) 05/22/2011   Gastroparesis 04/24/2008   WEIGHT LOSS 08/24/2007   NAUSEA AND VOMITING 08/24/2007    THERAPY DIAG:  Chronic left shoulder pain  Muscle weakness (generalized)  Chronic right shoulder pain  Stiffness of left shoulder, not elsewhere classified  Stiffness of right shoulder, not elsewhere classified   Rationale for Evaluation and Treatment Rehabilitation  REFERRING DIAG:  Bil shoulder adhesive capuslitis   PERTINENT HISTORY: Hx of stroke, DVT, CHF, Afib, Neuropathy, neck pain, gout, DDD, ESRD, cervical stenosis, dialysis (T, Th, sat), kidney transplant (failed)      PRECAUTIONS/RESTRICTIONS:   see pertinent history   SUBJECTIVE:  Patient reports that her pain is improved today, she states she was able to her hair easier today with less pain.   Pain:  Are you having pain? Yes Pain location: diffuse shoulder pain R>L NPRS scale:  current 6/10  Aggravating  factors: reaching, lifting, activity Relieving factors: rest Pain description: constant and aching Stage: Chronic 24 hour pattern: worse with activity   OBJECTIVE: (objective measures completed at initial evaluation unless otherwise dated)  ASTERISK SIGNS     Asterisk Signs Eval (12/10/2021)  1/17  11/29  12/20 12/20  1/3  1/31  Functional ER To ear bil             Flexion  R 6.2 lbs, L 7.5 lbs             Comb hair unable   unable  Able to reach sides of head but not back      Able to reach almost to back & with decreased pain   flexion   L 90 R 100      Passive  L 130  R 130  Active L 100 R 90                    TREATMENT 03/03/22:  Therapeutic Exercise: - nu-step - L5 - 8' - 306 steps - chest press (reclined) - 0# - 3x10 - elbow flexion/extension (reclined) - 0# 3x10 BIL - Low row - 3x10 - YTB - shoulder ext - YTB - 3x10 - Shoulder ER/IR YTB x10 each BIL Modalities: 10 min MHP following session, pt in reclined position on table  TREATMENT 02/18/22: Therapeutic Exercise: - nu-step - L5 - 8' - 300 steps - chest press (reclined) - 0# - 3x10 - Low row - 3x10 - YTB - shoulder ext - YTB - 3x10 Modalities: 10 min MHP following session, pt in reclined position on table  TREATMENT 1/12:  Therapeutic Exercise: - nu-step - L5 - 8.5' - 500 steps - chest press (reclined) - 0# - 3x10 - Low row - 3x10 - YTB (NT) - shoulder ext - YTB - 2x10 (NT) - Shoulder isometric IR/ER in supine - 30x ea - supine shoulder ER - GTB - x10 ea  Manual Therapy: - ER and flexion ROM with gentle oscillations at end range  10 min heat following session    ASSESSMENT:   CLINICAL IMPRESSION: Patient presents to PT reporting improved pain today and that she was able to do her hair easier today with less pain. Session today continued to focus on gentle strengthening and ROM for BIL shoulders. She  remains limited by pain, but was able to complete more exercises today. Patient continues to benefit  from skilled PT services and should be progressed as able to improve functional independence.    OBJECTIVE IMPAIRMENTS: Pain, shoulder ROM, shoulder strength   ACTIVITY LIMITATIONS: housework, lifting, reaching   PERSONAL FACTORS: See medical history and pertinent history     REHAB POTENTIAL: Fair chronic, recurring, complex medical history   CLINICAL DECISION MAKING: Stable/uncomplicated   EVALUATION COMPLEXITY: Low     GOALS:     SHORT TERM GOALS: Target date: 01/07/2022   Re[place] will be >75% HEP compliant to improve carryover between sessions and facilitate independent management of condition   Evaluation (12/10/2021): ongoing Goal status: MET 01/07/22: Pt reports adherence      LONG TERM GOALS: Target date: 02/04/2022 (extended to 04/01/22)   Tina Watkins will show a >/= 15 pt improvement in her QUICK DASH score (MCID is 13.4 pts or 8%) as a proxy for functional improvement    Evaluation/Baseline (12/10/2021): 29 pts 02/04/22: QuickDASH Score: 63.6 / 100 = 63.6 % Goal status: Ongoing     2.  Tina Watkins will be able to comb her hair, not limited by pain or ROM   Evaluation/Baseline (12/10/2021): limited 02/04/2022: continues to be limited d/t pain Goal status: INITIAL     3.  Tina Watkins will be able to reach to low shelf, not limited by pain or ROM   Evaluation/Baseline (12/10/2021): limited 1/3: able to reach first shelf with some pain Goal status: progressing     4.  Tina Watkins will demonstrate >110 degrees of active ROM in flexion to allow completion of activities involving reaching OH, not limited by pain   Evaluation/Baseline (12/10/2021):    UPPER EXTREMITY AROM:   AROM/PROM Right 12/10/2021 Left 12/10/2021 R 1/3 L 1/3  Shoulder flexion 90/100 80/90 90/130 100/130  Shoulder abduction 70 70    Shoulder internal rotation        Shoulder external rotation        Functional IR PSIS PSIS    Functional ER ear ear    Shoulder extension        Elbow extension         Elbow flexion          (Blank rows = not tested, N = WNL, * = concordant pain with testing)   Goal status: INITIAL       PLAN: PT FREQUENCY: 1-2x/week   PT DURATION: 8 weeks (Ending 04/01/2022)   PLANNED INTERVENTIONS: Therapeutic exercises, Aquatic therapy, Therapeutic activity, Neuro Muscular re-education, Gait training, Patient/Family education, Joint mobilization, Dry Needling, Electrical stimulation, Spinal mobilization and/or manipulation, Moist heat, Taping, Vasopneumatic device, Ionotophoresis '4mg'$ /ml Dexamethasone, and Manual therapy   PLAN FOR NEXT SESSION: progressive shoulder strengthening, MT for bil shoulder capsules, ROM progression   Margarette Canada PTA 03/04/2022, 9:54 AM

## 2022-03-04 ENCOUNTER — Ambulatory Visit: Payer: Medicare Other

## 2022-03-04 DIAGNOSIS — G8929 Other chronic pain: Secondary | ICD-10-CM

## 2022-03-04 DIAGNOSIS — M25611 Stiffness of right shoulder, not elsewhere classified: Secondary | ICD-10-CM

## 2022-03-04 DIAGNOSIS — M25512 Pain in left shoulder: Secondary | ICD-10-CM | POA: Diagnosis not present

## 2022-03-04 DIAGNOSIS — M6281 Muscle weakness (generalized): Secondary | ICD-10-CM

## 2022-03-04 DIAGNOSIS — M25612 Stiffness of left shoulder, not elsewhere classified: Secondary | ICD-10-CM

## 2022-03-08 ENCOUNTER — Other Ambulatory Visit: Payer: Self-pay | Admitting: Nurse Practitioner

## 2022-03-08 DIAGNOSIS — K3184 Gastroparesis: Secondary | ICD-10-CM

## 2022-03-08 DIAGNOSIS — R112 Nausea with vomiting, unspecified: Secondary | ICD-10-CM

## 2022-03-08 NOTE — Progress Notes (Signed)
Office Visit Note  Patient: [name]             Date of Birth: Jul 27, 1965           MRN: [id number]             PCP: Fenton Foy, NP Referring: Teena Dunk, NP Visit Date: 03/09/2022   Subjective:  Follow-up   History of Present Illness: Tina Watkins is a 57 y.o. female here for follow up with joint pain in multiple areas current exacerbation doing worse with right shoulder and left hip pain. Chronic gout on allopurinol appears well controlled.  Hip is painful when getting up or down from seated position or lying on her side.  Most noticeable while trying to sleep occasionally waking to reposition.  She does not have any hip pain with weightbearing when she is up and walking.  Also having worsening again with the right shoulder pain.  Previous bilateral shoulder injections last year only helped on the left side.  She had a lot of improvement in range of motion on the right side the after the physical therapy course.   Previous HPI 01/14/22 Tina Watkins is a 57 y.o. female here for follow up for chronic joint pain with history of neuropathy, gout, osteoarthritis, and possible fibromyalgia syndrome. At last visit no new flare ups of inflammatory arthritis. We tried bilateral shoulder subacromial bursa steroid injections. She had some pain relief after the injection but did feel like the pain came back again similar as before.  She started physical therapy as referred by her orthopedist has had 5 or 6 sessions so far and is noticing an improvement with the shoulder range of motion and stiffness.  She followed up with them this morning with recommendation to continue therapy for now and plan follow-up in about February for repeat shoulder injection.  She has not had any new flareups of gout.  She is having some increase in lateral hip pain this is most noticeable when lying on the bed trying to sleep.  Rarely awakens her from sleep.  She is doing some stationary biking  exercise with the physical therapist but no specific range of motion exercises for her hips.   Previous HPI 09/26/21 Tina Watkins is a 57 y.o. female here for follow up or evaluation of pain with history of neuropathy, gout, osteoarthritis, and possible fibromyalgia syndrome.  She is not experiencing any major flareups with the toe inflammation or swelling since her last visit.  Does still continue having considerable pain on a daily basis at multiple sites including lower extremities hands shoulders neck and back.  Bilateral shoulder pains were improved with the steroid injections at her last visit but has experienced worsening again with stiffness and difficulty in range of motion.   Previous HPI 07/07/2021 Tina Watkins is a 57 y.o. female here for follow up for evaluation of pain with history of neuropathy, gout, osteoarthritis, and possible fibromyalgia syndrome.  At initial visit uric acid level was 3.2 suspect gout is well controlled at this time. Imaging of hands and shoulders did not show significant shoulder arthritis but hands with chronic changes including probable small distal erosions. She continues to have a lot of pain also feeling numbness and swelling involving both hands. She is unable to easily reach up or behind her back, also with increased pain lying on her side provokes worse numbness to that side.   Previous HPI 06/06/2021 Tina Watkins is  a 57 y.o. female here for evaluation of pain with history of neuropathy, gout, osteoarthritis, and possible fibromyalgia syndrome. 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 She has joint pain and stiffness in a lot of areas. Muscle pains in neck and upper back.  Large joint pains have been bothering her for years varying in severity.  Shoulders and back are doing worse particularly in the past month.  Episodic gout flares have caused severe pain in first MTP joints.   Most recently gout flare in the left great toe about a week ago improved with colchicine treatment.  However on a routine basis having more symptoms in the bilateral hands and shoulders.  She was started on duloxetine 30 mg for lower extremity neuropathy pain. On allopurinol 100 mg daily for gout. Previous knee xray and MRI findings with chondrocalcinosis and bone infarcts. Right hip imaging with AVN.     Review of Systems  Constitutional:  Positive for fatigue.  HENT: Negative.  Negative for mouth sores and mouth dryness.   Eyes: Negative.  Negative for dryness.  Respiratory: Negative.  Negative for shortness of breath.   Cardiovascular: Negative.  Negative for chest pain and palpitations.  Gastrointestinal: Negative.  Negative for blood in stool, constipation and diarrhea.  Endocrine: Negative.  Negative for increased urination.  Genitourinary: Negative.  Negative for involuntary urination.  Musculoskeletal:  Positive for joint pain, joint pain, myalgias, muscle weakness, morning stiffness, muscle tenderness and myalgias. Negative for gait problem and joint swelling.  Skin:  Positive for color change. Negative for rash, hair loss and sensitivity to sunlight.  Allergic/Immunologic: Negative.  Negative for susceptible to infections.  Neurological: Negative.  Negative for dizziness and headaches.  Hematological: Negative.  Negative for swollen glands.  Psychiatric/Behavioral:  Positive for depressed mood and sleep disturbance. The patient is nervous/anxious.     PMFS History:  Patient Active Problem List   Diagnosis Date Noted   Bilateral hip pain 01/14/2022   Prolonged Q-T interval on ECG 09/07/2021   Nausea & vomiting 09/06/2021   Bilateral shoulder pain 06/06/2021   Bilateral hand swelling 06/06/2021   Cervical radiculitis 01/24/2021   Acute respiratory failure with hypoxia ([place]) 10/23/2020   Coagulation defect, unspecified ([place]) 08/01/2020   Aortic atherosclerosis ([place]) 07/10/2020    Secondary hypercoagulable state ([place]) 04/23/2020   Atrial flutter ([place]) 03/29/2020   Chronic pain of both knees 03/13/2020   Chills (without fever) 12/12/2019   Other pancytopenia ([place])    Decubitus ulcer of sacral region, stage 1 08/18/2019   HCAP (healthcare-associated pneumonia) 08/05/2019   Pressure injury of skin 07/24/2019   Chest congestion 07/24/2019   Itching 07/24/2019   Weakness 06/13/2019   Pneumonia due to COVID-19 virus 05/09/2019   Hypotension 05/09/2019   Symptomatic anemia 05/09/2019   CHF (congestive heart failure) ([place]) 05/09/2019   Swollen abdomen 01/04/2019   DDD (degenerative disc disease), cervical 12/06/2018   Neuropathy 12/06/2018   Paresthesia 10/11/2018   Neck pain 10/11/2018   Tobacco abuse 11/20/2017   Right leg swelling 11/20/2017   Right leg pain 11/20/2017   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)    Multifocal pneumonia 08/09/2017  ETD (Eustachian tube dysfunction), bilateral 08/03/2017   Acute recurrent pansinusitis 07/21/2017   Chest pain 09/29/2015   Cervical stenosis of spinal canal 01/08/2015   Urinary tract infectious disease    Muscle spasms of neck 06/15/2014   Bleeding hemorrhoid 06/15/2014   Anemia in chronic kidney disease 06/15/2014   Pyelonephritis, acute 06/13/2014   Sepsis ([place]) 06/13/2014   History of kidney transplant    Abnormal CT scan 09/14/2012   Chronic pain syndrome 04/09/2012   Dehydration, mild 04/09/2012   Herpes infection 06/23/2011   Anxiety 06/23/2011   E coli bacteremia 06/18/2011   ESRD (end stage renal disease) ([place]) 06/12/2011   UTI (lower urinary tract infection) 06/12/2011   Bacteremia due to Gram-negative bacteria  05/23/2011   History of renal transplantation 05/22/2011   Septic shock(785.52) 05/22/2011   Acute on chronic kidney failure ([place]) 05/22/2011   Gastroparesis 04/24/2008   WEIGHT LOSS 08/24/2007   NAUSEA AND VOMITING 08/24/2007    Past Medical History:  Diagnosis Date   Bacteremia due to Gram-negative bacteria 05/23/2011   Blind    right eye   Blind right eye    CHF (congestive heart failure) ([facility])    Chronic lower back pain    Complication of anesthesia    "woke up during OR; I have an extremely high tolerance" (12/11/2011) 1 procedure was graft; the other procedures were procedures that are typically done with sedation.   DDD (degenerative disc disease), cervical    Depression    Dysrhythmia    "tachycardia" (12/11/2011) new onset afib 10/15/14 EKG   E coli bacteremia 06/18/2011   Elevated LDL cholesterol level 12/2018   ESRD (end stage renal disease) ([place]) 06/12/2011   Fibromyalgia    Gastroparesis    Gastroparesis    GERD (gastroesophageal reflux disease)    Glaucoma    right eye   Gout    H/O carpal tunnel syndrome    Headache 10/2019   Headache(784.0)    "not often anymore" (12/11/2011)   Herpes genitalia 1994   History of blood transfusion    "more than a few times" (12/11/2011)   History of stomach ulcers    Hypotension    Iron deficiency anemia    New onset a-fib ([place])    10/15/14 EKG   Osteopenia    Peripheral neuropathy 11/2018   Pressure ulcer of sacral region, stage 1 07/2019   Seizures ([place]) 1994   "post transplant; only have had that one" (12/11/2011)   Spinal stenosis in cervical region    Stroke [facility])     left basal ganglia lacunar infarct; Right frontal lobe lacunar infarct.   Stroke [facility]) ~ 1999; 2001   "briefly lost my vision; lost my right eye" (12/11/2011)   Vitamin D deficiency 12/2018    Family History  Problem Relation Age of Onset   Glaucoma Mother    Pancreatic cancer Father    Multiple sclerosis Brother    Diabetes Maternal Uncle     Hypertension Maternal Grandmother    Breast cancer Neg Hx    Past Surgical History:  Procedure Laterality Date   ANTERIOR CERVICAL DECOMP/DISCECTOMY FUSION N/A 01/08/2015   Procedure: Anterior Cervical Three-Four/Four-Five Decompression/Diskectomy/Fusion;  Surgeon: Leeroy Cha, MD;  Location: [facility];  Service: Neurosurgery;  Laterality: N/A;  C3-4 C4-5 Anterior cervical decompression/diskectomy/fusion   APPENDECTOMY  ~ 2004   BIOPSY  09/12/2020   Procedure: BIOPSY;  Surgeon: Milus Banister, MD;  Location: [facility] ENDOSCOPY;  Service: Endoscopy;;   CATARACT EXTRACTION  right eye   COLONOSCOPY     COLONOSCOPY WITH PROPOFOL N/A 09/12/2020   Procedure: COLONOSCOPY WITH PROPOFOL;  Surgeon: Milus Banister, MD;  Location: [facility] ENDOSCOPY;  Service: Endoscopy;  Laterality: N/A;   ENUCLEATION  2001   "right"   ESOPHAGOGASTRODUODENOSCOPY (EGD) WITH PROPOFOL N/A 04/21/2012   Procedure: ESOPHAGOGASTRODUODENOSCOPY (EGD) WITH PROPOFOL;  Surgeon: Milus Banister, MD;  Location: [facility];  Service: Endoscopy;  Laterality: N/A;   ESOPHAGOGASTRODUODENOSCOPY (EGD) WITH PROPOFOL N/A 09/12/2020   Procedure: ESOPHAGOGASTRODUODENOSCOPY (EGD) WITH PROPOFOL;  Surgeon: Milus Banister, MD;  Location: [facility] END[facility];  Service: Endoscopy;  Laterality: N/A;   INSERTION OF DIALYSIS CATHETER  1988   "AV graft LUA & LFA; LUA worked for 1 day; LFA never worked"   IR FLUORO GUIDE CV LINE RIGHT  07/11/2019   IR FLUORO GUIDE CV LINE RIGHT  10/20/2019   IR FLUORO GUIDE CV LINE RIGHT  05/31/2020   IR PTA VENOUS EXCEPT DIALYSIS CIRCUIT  05/31/2020   IR RADIOLOGY PERIPHERAL GUIDED IV START  07/11/2019   IR US GUIDE VASC ACCESS RIGHT  07/11/2019   IR US GUIDE VASC ACCESS RIGHT  07/11/2019   IR US GUIDE VASC ACCESS RIGHT  07/11/2019   IR VENOCAVAGRAM IVC  05/31/2020   KIDNEY TRANSPLANT  1994; 1999; 2005   "right"   MULTIPLE TOOTH EXTRACTIONS     POLYPECTOMY  09/12/2020   Procedure: POLYPECTOMY;  Surgeon: Milus Banister, MD;   Location: [facility];  Service: Endoscopy;;   RIGHT HEART CATH N/A 03/23/2019   Procedure: RIGHT HEART CATH;  Surgeon: Jolaine Artist, MD;  Location: [place];  Service: Cardiovascular;  Laterality: N/A;   [place]?; 1994; 2005   Social History   Social History Narrative   Right-handed.   Four cups caffeine per day.   Lives alone.   Immunization History  Administered Date(s) Administered   Hepatitis B, PED/ADOLESCENT 07/20/2019, 08/29/2019, 09/21/2019   Hepb-cpg 07/20/2019, 08/29/2019, 09/21/2019, 11/23/2019   Influenza Split 11/07/2019   Influenza,inj,Quad PF,6+ Mos 11/07/2019, 11/07/2020   PFIZER Comirnaty(Gray Top)Covid-19 Tri-Sucrose Vaccine 07/29/2020   PFIZER(Purple Top)SARS-COV-2 Vaccination 05/31/2019, 06/21/2019, 10/26/2019   PPD Test 08/10/2017   Pfizer Covid-19 Vaccine Bivalent Booster 54yr & up 02/06/2021    Objective: Vital Signs: BP 99/62 (BP Location: Left Arm, Patient Position: Sitting, Cuff Size: Normal)   Pulse (!) 52   Resp 16   Ht 4' 11"$  (1.499 m)   Wt 117 lb (53.1 kg)   BMI 23.63 kg/m    Physical Exam Cardiovascular:     Rate and Rhythm: Normal rate and regular rhythm.  Pulmonary:     Effort: Pulmonary effort is normal.     Breath sounds: Normal breath sounds.  Skin:    General: Skin is warm and dry.     Findings: Rash present.     Comments: Lower extremity hyperpigmented skin rash with some overlying excoriations  Neurological:     Mental Status: She is alert.      Musculoskeletal Exam:  Right shoulder tenderness to pressure around all sides, achieves good ROM but with pain, limited in overhead abduction Elbows full ROM no tenderness or swelling Wrists full ROM no tenderness or swelling Fingers full ROM first IP joint widening distal phalanx shortening with lateral deviation on the right thumb, left thumb with some triggering no palpable swelling  Left lateral hip tenderness to pressure, more  along border of iliac crest versus trochanter or  gluteal muscle, no radiation Knees full ROM no tenderness or swelling Ankles decreased ROM all directions no tenderness or swelling   Investigation: No additional findings.  Imaging: XR HIP UNILAT W OR W/O PELVIS 2-3 VIEWS LEFT  Result Date: 03/11/2022 X-ray left hip 2 views AP and lateral No significant joint space abnormality in the left hip.  May be some sclerotic changes superior on the right femoral head.  SI joints show irregular joint space changes and mild sclerosis appears consistent for degenerative changes.  No significant enthesophytes or soft tissue calcifications seen.  Appears to be some stool burden present.  Vascular calcifications present throughout the visualized areas. Impression No significant left hip osteoarthritis, there are some degenerative changes in the SI joints and extensive vascular calcifications consistent with her renal disease   Recent Labs: Lab Results  Component Value Date   WBC 4.9 10/10/2021   HGB 10.6 (L) 10/10/2021   PLT 172 10/10/2021   NA 139 10/10/2021   K 4.0 10/10/2021   CL 100 10/10/2021   CO2 21 (L) 10/10/2021   GLUCOSE 71 10/10/2021   BUN 34 (H) 10/10/2021   CREATININE 5.16 (H) 10/10/2021   BILITOT 0.8 10/10/2021   ALKPHOS 140 (H) 10/10/2021   AST 26 10/10/2021   ALT 25 10/10/2021   PROT 7.2 10/10/2021   ALBUMIN 4.4 10/10/2021   CALCIUM 10.6 (H) 10/10/2021   GFRAA 13 (L) 10/13/2019    Speciality Comments: No specialty comments available.  Procedures:  No procedures performed Allergies: Levofloxacin and Tape   Assessment / Plan:     Visit Diagnoses: Bilateral hip pain - Plan: XR HIP UNILAT W OR W/O PELVIS 2-3 VIEWS LEFT, cyclobenzaprine (FLEXERIL) 5 MG tablet  Lateral hip pain does not localize well to the trochanteric bursa so suspect more of a gluteal muscle or tendon syndrome.  X-ray of the hip shows a little bit of osteoarthritis but probably not accounting for the  symptoms.  Discussed treatment options she has been doing ongoing therapy for the shoulder we will hold off on any referral about the hip or additional steroid injection.  Try addition of Flexeril 5 mg as needed especially at night.  Chronic pain of both shoulders  Overall doing a lot better still with a lot of pain and soreness in the right side but increased range of motion with PT.  Making some continued progress.  Left shoulder is doing well since subacromial bursa steroid injection last year.  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  No new inflammatory joint changes noted does not have increase in lower extremity swelling either at this time.  Can keep planned follow-up in a few months for monitoring.  Orders: Orders Placed This Encounter  Procedures   XR HIP UNILAT W OR W/O PELVIS 2-3 VIEWS LEFT   Meds ordered this encounter  Medications   cyclobenzaprine (FLEXERIL) 5 MG tablet    Sig: Take 1 tablet (5 mg total) by mouth at bedtime as needed.    Dispense:  30 tablet    Refill:  0     Follow-Up Instructions: No follow-ups on file.   Collier Salina, MD  Note - This record has been created using [employer].  Chart creation errors have been sought, but may not always  have been located. Such creation errors do not reflect on  the standard of medical care.

## 2022-03-09 ENCOUNTER — Ambulatory Visit (INDEPENDENT_AMBULATORY_CARE_PROVIDER_SITE_OTHER): Payer: Medicare Other

## 2022-03-09 ENCOUNTER — Encounter (HOSPITAL_COMMUNITY): Payer: Self-pay | Admitting: *Deleted

## 2022-03-09 ENCOUNTER — Other Ambulatory Visit: Payer: Self-pay | Admitting: Physician Assistant

## 2022-03-09 ENCOUNTER — Ambulatory Visit: Payer: Medicare Other | Attending: Internal Medicine | Admitting: Internal Medicine

## 2022-03-09 VITALS — BP 99/62 | HR 52 | Resp 16 | Ht 59.0 in | Wt 117.0 lb

## 2022-03-09 DIAGNOSIS — M1A3720 Chronic gout due to renal impairment, left ankle and foot, without tophus (tophi): Secondary | ICD-10-CM | POA: Diagnosis not present

## 2022-03-09 DIAGNOSIS — M25552 Pain in left hip: Secondary | ICD-10-CM

## 2022-03-09 DIAGNOSIS — M25511 Pain in right shoulder: Secondary | ICD-10-CM | POA: Insufficient documentation

## 2022-03-09 DIAGNOSIS — M25512 Pain in left shoulder: Secondary | ICD-10-CM | POA: Diagnosis present

## 2022-03-09 DIAGNOSIS — G8929 Other chronic pain: Secondary | ICD-10-CM | POA: Diagnosis present

## 2022-03-09 DIAGNOSIS — M25551 Pain in right hip: Secondary | ICD-10-CM

## 2022-03-09 MED ORDER — CYCLOBENZAPRINE HCL 5 MG PO TABS: 5.0000 mg | ORAL_TABLET | Freq: Every evening | ORAL | 0 refills | Status: DC | PRN

## 2022-03-11 ENCOUNTER — Ambulatory Visit: Payer: Medicare Other | Attending: Internal Medicine

## 2022-03-11 DIAGNOSIS — M25512 Pain in left shoulder: Secondary | ICD-10-CM | POA: Diagnosis present

## 2022-03-11 DIAGNOSIS — M6281 Muscle weakness (generalized): Secondary | ICD-10-CM | POA: Diagnosis present

## 2022-03-11 DIAGNOSIS — M25511 Pain in right shoulder: Secondary | ICD-10-CM | POA: Insufficient documentation

## 2022-03-11 DIAGNOSIS — M25611 Stiffness of right shoulder, not elsewhere classified: Secondary | ICD-10-CM | POA: Insufficient documentation

## 2022-03-11 DIAGNOSIS — G8929 Other chronic pain: Secondary | ICD-10-CM | POA: Insufficient documentation

## 2022-03-11 DIAGNOSIS — M25612 Stiffness of left shoulder, not elsewhere classified: Secondary | ICD-10-CM | POA: Insufficient documentation

## 2022-03-11 NOTE — Therapy (Signed)
Treatment note  Patient Name: Tina Watkins MRN: [id number] DOB:1965-02-24, 57 y.o., female Today's Date: 03/11/2022  PCP: Bo Merino I, NP   REFERRING PROVIDER: Cleophas Dunker, *   PT End of Session - 03/11/22 0959     Visit Number 12    Date for PT Re-Evaluation 04/01/22    Authorization Type MEDICARE PART A AND B; MEDICAID [place] ACCESS    Progress Note Due on Visit 17    PT Start Time 1000    PT Stop Time 1050    PT Time Calculation (min) 50 min    Activity Tolerance Patient tolerated treatment well;Patient limited by pain    Behavior During Therapy [facility] for tasks assessed/performed             Past Medical History:  Diagnosis Date   Bacteremia due to Gram-negative bacteria 05/23/2011   Blind    right eye   Blind right eye    CHF (congestive heart failure) (HCC)    Chronic lower back pain    Complication of anesthesia    "woke up during OR; I have an extremely high tolerance" (12/11/2011) 1 procedure was graft; the other procedures were procedures that are typically done with sedation.   DDD (degenerative disc disease), cervical    Depression    Dysrhythmia    "tachycardia" (12/11/2011) new onset afib 10/15/14 EKG   E coli bacteremia 06/18/2011   Elevated LDL cholesterol level 12/2018   ESRD (end stage renal disease) ([place]) 06/12/2011   Fibromyalgia    Gastroparesis    Gastroparesis    GERD (gastroesophageal reflux disease)    Glaucoma    right eye   Gout    H/O carpal tunnel syndrome    Headache 10/2019   Headache(784.0)    "not often anymore" (12/11/2011)   Herpes genitalia 1994   History of blood transfusion    "more than a few times" (12/11/2011)   History of stomach ulcers    Hypotension    Iron deficiency anemia    New onset a-fib ([place])    10/15/14 EKG   Osteopenia    Peripheral neuropathy 11/2018   Pressure ulcer of sacral region, stage 1 07/2019   Seizures ([place]) 1994   "post transplant; only have had that one" (12/11/2011)    Spinal stenosis in cervical region    Stroke [facility])     left basal ganglia lacunar infarct; Right frontal lobe lacunar infarct.   Stroke [facility]) ~ 1999; 2001   "briefly lost my vision; lost my right eye" (12/11/2011)   Vitamin D deficiency 12/2018   Past Surgical History:  Procedure Laterality Date   ANTERIOR CERVICAL DECOMP/DISCECTOMY FUSION N/A 01/08/2015   Procedure: Anterior Cervical Three-Four/Four-Five Decompression/Diskectomy/Fusion;  Surgeon: Leeroy Cha, MD;  Location: [place] NEURO ORS;  Service: Neurosurgery;  Laterality: N/A;  C3-4 C4-5 Anterior cervical decompression/diskectomy/fusion   APPENDECTOMY  ~ 2004   BIOPSY  09/12/2020   Procedure: BIOPSY;  Surgeon: Milus Banister, MD;  Location: [facility] ENDOSCOPY;  Service: Endoscopy;;   CATARACT EXTRACTION     right eye   COLONOSCOPY     COLONOSCOPY WITH PROPOFOL N/A 09/12/2020   Procedure: COLONOSCOPY WITH PROPOFOL;  Surgeon: Milus Banister, MD;  Location: [facility] ENDOSCOPY;  Service: Endoscopy;  Laterality: N/A;   ENUCLEATION  2001   "right"   ESOPHAGOGASTRODUODENOSCOPY (EGD) WITH PROPOFOL N/A 04/21/2012   Procedure: ESOPHAGOGASTRODUODENOSCOPY (EGD) WITH PROPOFOL;  Surgeon: Milus Banister, MD;  Location: [facility] ENDOSCOPY;  Service: Endoscopy;  Laterality: N/A;  ESOPHAGOGASTRODUODENOSCOPY (EGD) WITH PROPOFOL N/A 09/12/2020   Procedure: ESOPHAGOGASTRODUODENOSCOPY (EGD) WITH PROPOFOL;  Surgeon: Milus Banister, MD;  Location: [facility]OS[facility];  Service: Endoscopy;  Laterality: N/A;   INSERTION OF DIALYSIS CATHETER  1988   "AV graft LUA & LFA; LUA worked for 1 day; LFA never worked"   IR FLUORO GUIDE CV LINE RIGHT  07/11/2019   IR FLUORO GUIDE CV LINE RIGHT  10/20/2019   IR FLUORO GUIDE CV LINE RIGHT  05/31/2020   IR PTA VENOUS EXCEPT DIALYSIS CIRCUIT  05/31/2020   IR RADIOLOGY PERIPHERAL GUIDED IV START  07/11/2019   IR US GUIDE VASC ACCESS RIGHT  07/11/2019   IR US GUIDE VASC ACCESS RIGHT  07/11/2019   IR US GUIDE VASC ACCESS RIGHT  07/11/2019   IR  VENOCAVAGRAM IVC  05/31/2020   KIDNEY TRANSPLANT  1994; 1999; 2005   "right"   MULTIPLE TOOTH EXTRACTIONS     POLYPECTOMY  09/12/2020   Procedure: POLYPECTOMY;  Surgeon: Milus Banister, MD;  Location: [facility] ENDOS[facility];  Service: Endoscopy;;   RIGHT HEART CATH N/A 03/23/2019   Procedure: RIGHT HEART CATH;  Surgeon: Jolaine Artist, MD;  Location: [place] CV LAB;  Service: Cardiovascular;  Laterality: N/A;   [place]?; 1994; 2005   Patient Active Problem List   Diagnosis Date Noted   Bilateral hip pain 01/14/2022   Prolonged Q-T interval on ECG 09/07/2021   Nausea & vomiting 09/06/2021   Bilateral shoulder pain 06/06/2021   Bilateral hand swelling 06/06/2021   Cervical radiculitis 01/24/2021   Acute respiratory failure with hypoxia ([place]) 10/23/2020   Coagulation defect, unspecified ([place]) 08/01/2020   Aortic atherosclerosis ([place]) 07/10/2020   Secondary hypercoagulable state ([place]) 04/23/2020   Atrial flutter ([place]) 03/29/2020   Chronic pain of both knees 03/13/2020   Chills (without fever) 12/12/2019   Other pancytopenia ([place])    Decubitus ulcer of sacral region, stage 1 08/18/2019   HCAP (healthcare-associated pneumonia) 08/05/2019   Pressure injury of skin 07/24/2019   Chest congestion 07/24/2019   Itching 07/24/2019   Weakness 06/13/2019   Pneumonia due to COVID-19 virus 05/09/2019   Hypotension 05/09/2019   Symptomatic anemia 05/09/2019   CHF (congestive heart failure) ([place]) 05/09/2019   Swollen abdomen 01/04/2019   DDD (degenerative disc disease), cervical 12/06/2018   Neuropathy 12/06/2018   Paresthesia 10/11/2018   Neck pain 10/11/2018   Tobacco abuse 11/20/2017   Right leg swelling 11/20/2017   Right leg pain 11/20/2017   Stroke (cerebrum) ([place]) 11/20/2017   GERD (gastroesophageal reflux disease) 11/20/2017   Acute venous embolism and thrombosis of deep vessels of proximal lower extremity, right ([place]) 11/20/2017   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)    Multifocal pneumonia 08/09/2017   ETD (Eustachian tube dysfunction), bilateral 08/03/2017   Acute recurrent pansinusitis 07/21/2017   Chest pain 09/29/2015   Cervical stenosis of spinal canal 01/08/2015   Urinary tract infectious disease    Muscle spasms of neck 06/15/2014   Bleeding hemorrhoid 06/15/2014   Anemia in chronic kidney disease 06/15/2014   Pyelonephritis, acute 06/13/2014   Sepsis ([place]) 06/13/2014   History of kidney transplant    Abnormal CT scan 09/14/2012   Chronic pain syndrome 04/09/2012   Dehydration, mild 04/09/2012   Herpes infection 06/23/2011   Anxiety 06/23/2011   E coli bacteremia 06/18/2011  ESRD (end stage renal disease) ([place]) 06/12/2011   UTI (lower urinary tract infection) 06/12/2011   Bacteremia due to Gram-negative bacteria 05/23/2011   History of renal transplantation 05/22/2011   Septic shock(785.52) 05/22/2011   Acute on chronic kidney failure ([place]) 05/22/2011   Gastroparesis 04/24/2008   WEIGHT LOSS 08/24/2007   NAUSEA AND VOMITING 08/24/2007    THERAPY DIAG:  Chronic left shoulder pain  Muscle weakness (generalized)  Chronic right shoulder pain  Stiffness of left shoulder, not elsewhere classified  Stiffness of right shoulder, not elsewhere classified   Rationale for Evaluation and Treatment Rehabilitation  REFERRING DIAG:  Bil shoulder adhesive capuslitis   PERTINENT HISTORY: Hx of stroke, DVT, CHF, Afib, Neuropathy, neck pain, gout, DDD, ESRD, cervical stenosis, dialysis (T, Th, sat), kidney transplant (failed)      PRECAUTIONS/RESTRICTIONS:   see pertinent history   SUBJECTIVE:  Patient reports that she has been getting regular massages and this is helping her shoulder pain. She inquires about having massage at PT.  Pain:  Are you having pain? Yes Pain location: diffuse shoulder pain R>L NPRS scale:   current 4/10  Aggravating factors: reaching, lifting, activity Relieving factors: rest Pain description: constant and aching Stage: Chronic 24 hour pattern: worse with activity   OBJECTIVE: (objective measures completed at initial evaluation unless otherwise dated)  ASTERISK SIGNS     Asterisk Signs Eval (12/10/2021)  1/17  11/29  12/20 12/20  1/3  1/31  Functional ER To ear bil             Flexion  R 6.2 lbs, L 7.5 lbs             Comb hair unable   unable  Able to reach sides of head but not back      Able to reach almost to back & with decreased pain   flexion   L 90 R 100      Passive  L 130  R 130  Active L 100 R 90                    TREATMENT 03/11/22:  Therapeutic Exercise: - nu-step - L5 - 5' - 306 steps - chest press (reclined) - 2# x10 - elbow flexion/extension (reclined) - 2# 3x10 BIL - Low row - 3x10 - RTB - shoulder ext - RTB - 2x10 - Shoulder ER/IR YTB x10 each BIL Manual: -STM for BIL upper traps; pt positioned in supine, legs elevated Modalities: 10 min MHP following session, pt in reclined position on table   TREATMENT 03/03/22:  Therapeutic Exercise: - nu-step - L5 - 8' - 306 steps - chest press (reclined) - 0# - 3x10 - elbow flexion/extension (reclined) - 0# 3x10 BIL - Low row - 3x10 - YTB - shoulder ext - YTB - 3x10 - Shoulder ER/IR YTB x10 each BIL Modalities: 10 min MHP following session, pt in reclined position on table  TREATMENT 02/18/22: Therapeutic Exercise: - nu-step - L5 - 8' - 300 steps - chest press (reclined) - 0# - 3x10 - Low row - 3x10 - YTB - shoulder ext - YTB - 3x10 Modalities: 10 min MHP following session, pt in reclined position on table    ASSESSMENT:   CLINICAL IMPRESSION: Patient presents to PT reporting improved pain in BIL shoulders today. She inquires about having massage during PT sessions, explained STM to patient who was interested in trying. Performed STM to BIL upper traps, suboccipital areas with patient  reporting improved  pain post. Was able to increase resistance of exercises to good affect. Patient continues to benefit from skilled PT services and should be progressed as able to improve functional independence.    OBJECTIVE IMPAIRMENTS: Pain, shoulder ROM, shoulder strength   ACTIVITY LIMITATIONS: housework, lifting, reaching   PERSONAL FACTORS: See medical history and pertinent history     REHAB POTENTIAL: Fair chronic, recurring, complex medical history   CLINICAL DECISION MAKING: Stable/uncomplicated   EVALUATION COMPLEXITY: Low     GOALS:     SHORT TERM GOALS: Target date: 01/07/2022   Lainie will be >75% HEP compliant to improve carryover between sessions and facilitate independent management of condition   Evaluation (12/10/2021): ongoing Goal status: MET 01/07/22: Pt reports adherence      LONG TERM GOALS: Target date: 02/04/2022 (extended to 04/01/22)   Sandria Bales will show a >/= 15 pt improvement in her QUICK DASH score (MCID is 13.4 pts or 8%) as a proxy for functional improvement    Evaluation/Baseline (12/10/2021): 29 pts 02/04/22: QuickDASH Score: 63.6 / 100 = 63.6 % Goal status: Ongoing     2.  Kenika will be able to comb her hair, not limited by pain or ROM   Evaluation/Baseline (12/10/2021): limited 02/04/2022: continues to be limited d/t pain Goal status: INITIAL     3.  Patrcia will be able to reach to low shelf, not limited by pain or ROM   Evaluation/Baseline (12/10/2021): limited 1/3: able to reach first shelf with some pain Goal status: progressing     4.  Franshesca will demonstrate >110 degrees of active ROM in flexion to allow completion of activities involving reaching OH, not limited by pain   Evaluation/Baseline (12/10/2021):    UPPER EXTREMITY AROM:   AROM/PROM Right 12/10/2021 Left 12/10/2021 R 1/3 L 1/3  Shoulder flexion 90/100 80/90 90/130 100/130  Shoulder abduction 70 70    Shoulder internal rotation        Shoulder external  rotation        Functional IR PSIS PSIS    Functional ER ear ear    Shoulder extension        Elbow extension        Elbow flexion          (Blank rows = not tested, N = WNL, * = concordant pain with testing)   Goal status: INITIAL       PLAN: PT FREQUENCY: 1-2x/week   PT DURATION: 8 weeks (Ending 04/01/2022)   PLANNED INTERVENTIONS: Therapeutic exercises, Aquatic therapy, Therapeutic activity, Neuro Muscular re-education, Gait training, Patient/Family education, Joint mobilization, Dry Needling, Electrical stimulation, Spinal mobilization and/or manipulation, Moist heat, Taping, Vasopneumatic device, Ionotophoresis '4mg'$ /ml Dexamethasone, and Manual therapy   PLAN FOR NEXT SESSION: progressive shoulder strengthening, MT for bil shoulder capsules, ROM progression   Margarette Canada PTA 03/11/2022, 10:41 AM

## 2022-03-12 ENCOUNTER — Encounter (HOSPITAL_COMMUNITY): Payer: Self-pay | Admitting: *Deleted

## 2022-03-13 ENCOUNTER — Ambulatory Visit: Payer: Medicare Other | Admitting: Physical Therapy

## 2022-03-23 ENCOUNTER — Encounter: Payer: Self-pay | Admitting: Physical Therapy

## 2022-03-23 ENCOUNTER — Ambulatory Visit: Payer: Medicare Other | Admitting: Physical Therapy

## 2022-03-23 DIAGNOSIS — M25512 Pain in left shoulder: Secondary | ICD-10-CM | POA: Diagnosis not present

## 2022-03-23 DIAGNOSIS — M6281 Muscle weakness (generalized): Secondary | ICD-10-CM

## 2022-03-23 DIAGNOSIS — G8929 Other chronic pain: Secondary | ICD-10-CM

## 2022-03-23 NOTE — Therapy (Signed)
Treatment note  Patient Name: Tina Watkins MRN: [id number] DOB:April 04, 1965, 57 y.o., female Today's Date: 03/23/2022  PCP: Bo Merino I, NP   REFERRING PROVIDER: Cleophas Dunker, *   PT End of Session - 03/23/22 0745     Visit Number 13    Date for PT Re-Evaluation 04/27/22    Authorization Type MEDICARE PART A AND B; MEDICAID [place] ACCESS    Progress Note Due on Visit 57    PT Start Time 0745    PT Stop Time 0826    PT Time Calculation (min) 41 min    Activity Tolerance Patient tolerated treatment well;Patient limited by pain    Behavior During Therapy [facility] for tasks assessed/performed             Past Medical History:  Diagnosis Date   Bacteremia due to Gram-negative bacteria 05/23/2011   Blind    right eye   Blind right eye    CHF (congestive heart failure) (HCC)    Chronic lower back pain    Complication of anesthesia    "woke up during OR; I have an extremely high tolerance" (12/11/2011) 1 procedure was graft; the other procedures were procedures that are typically done with sedation.   DDD (degenerative disc disease), cervical    Depression    Dysrhythmia    "tachycardia" (12/11/2011) new onset afib 10/15/14 EKG   E coli bacteremia 06/18/2011   Elevated LDL cholesterol level 12/2018   ESRD (end stage renal disease) ([place]) 06/12/2011   Fibromyalgia    Gastroparesis    Gastroparesis    GERD (gastroesophageal reflux disease)    Glaucoma    right eye   Gout    H/O carpal tunnel syndrome    Headache 10/2019   Headache(784.0)    "not often anymore" (12/11/2011)   Herpes genitalia 1994   History of blood transfusion    "more than a few times" (12/11/2011)   History of stomach ulcers    Hypotension    Iron deficiency anemia    New onset a-fib ([place])    10/15/14 EKG   Osteopenia    Peripheral neuropathy 11/2018   Pressure ulcer of sacral region, stage 1 07/2019   Seizures ([place]) 1994   "post transplant; only have had that one" (12/11/2011)    Spinal stenosis in cervical region    Stroke [facility])     left basal ganglia lacunar infarct; Right frontal lobe lacunar infarct.   Stroke [facility]) ~ 1999; 2001   "briefly lost my vision; lost my right eye" (12/11/2011)   Vitamin D deficiency 12/2018   Past Surgical History:  Procedure Laterality Date   ANTERIOR CERVICAL DECOMP/DISCECTOMY FUSION N/A 01/08/2015   Procedure: Anterior Cervical Three-Four/Four-Five Decompression/Diskectomy/Fusion;  Surgeon: Leeroy Cha, MD;  Location: [place] NEURO ORS;  Service: Neurosurgery;  Laterality: N/A;  C3-4 C4-5 Anterior cervical decompression/diskectomy/fusion   APPENDECTOMY  ~ 2004   BIOPSY  09/12/2020   Procedure: BIOPSY;  Surgeon: Milus Banister, MD;  Location: [facility] ENDOSCOPY;  Service: Endoscopy;;   CATARACT EXTRACTION     right eye   COLONOSCOPY     COLONOSCOPY WITH PROPOFOL N/A 09/12/2020   Procedure: COLONOSCOPY WITH PROPOFOL;  Surgeon: Milus Banister, MD;  Location: [facility] ENDOSCOPY;  Service: Endoscopy;  Laterality: N/A;   ENUCLEATION  2001   "right"   ESOPHAGOGASTRODUODENOSCOPY (EGD) WITH PROPOFOL N/A 04/21/2012   Procedure: ESOPHAGOGASTRODUODENOSCOPY (EGD) WITH PROPOFOL;  Surgeon: Milus Banister, MD;  Location: [facility] ENDOSCOPY;  Service: Endoscopy;  Laterality: N/A;  ESOPHAGOGASTRODUODENOSCOPY (EGD) WITH PROPOFOL N/A 09/12/2020   Procedure: ESOPHAGOGASTRODUODENOSCOPY (EGD) WITH PROPOFOL;  Surgeon: Milus Banister, MD;  Location: [facility] ENDOSCOPY;  Service: Endoscopy;  Laterality: N/A;   INSERTION OF DIALYSIS CATHETER  1988   "AV graft LUA & LFA; LUA worked for 1 day; LFA never worked"   IR FLUORO GUIDE CV LINE RIGHT  07/11/2019   IR FLUORO GUIDE CV LINE RIGHT  10/20/2019   IR FLUORO GUIDE CV LINE RIGHT  05/31/2020   IR PTA VENOUS EXCEPT DIALYSIS CIRCUIT  05/31/2020   IR RADIOLOGY PERIPHERAL GUIDED IV START  07/11/2019   IR US GUIDE VASC ACCESS RIGHT  07/11/2019   IR US GUIDE VASC ACCESS RIGHT  07/11/2019   IR US GUIDE VASC ACCESS RIGHT  07/11/2019   IR  VENOCAVAGRAM IVC  05/31/2020   KIDNEY TRANSPLANT  1994; 1999; 2005   "right"   MULTIPLE TOOTH EXTRACTIONS     POLYPECTOMY  09/12/2020   Procedure: POLYPECTOMY;  Surgeon: Milus Banister, MD;  Location: [facility] ENDOS[facility];  Service: Endoscopy;;   RIGHT HEART CATH N/A 03/23/2019   Procedure: RIGHT HEART CATH;  Surgeon: Jolaine Artist, MD;  Location: [place] CV LAB;  Service: Cardiovascular;  Laterality: N/A;   Ali Molina?; 1994; 2005   Patient Active Problem List   Diagnosis Date Noted   Bilateral hip pain 01/14/2022   Prolonged Q-T interval on ECG 09/07/2021   Nausea & vomiting 09/06/2021   Bilateral shoulder pain 06/06/2021   Bilateral hand swelling 06/06/2021   Cervical radiculitis 01/24/2021   Acute respiratory failure with hypoxia ([place]) 10/23/2020   Coagulation defect, unspecified ([place]) 08/01/2020   Aortic atherosclerosis ([place]) 07/10/2020   Secondary hypercoagulable state ([place]) 04/23/2020   Atrial flutter ([place]) 03/29/2020   Chronic pain of both knees 03/13/2020   Chills (without fever) 12/12/2019   Other pancytopenia ([place])    Decubitus ulcer of sacral region, stage 1 08/18/2019   HCAP (healthcare-associated pneumonia) 08/05/2019   Pressure injury of skin 07/24/2019   Chest congestion 07/24/2019   Itching 07/24/2019   Weakness 06/13/2019   Pneumonia due to COVID-19 virus 05/09/2019   Hypotension 05/09/2019   Symptomatic anemia 05/09/2019   CHF (congestive heart failure) ([place]) 05/09/2019   Swollen abdomen 01/04/2019   DDD (degenerative disc disease), cervical 12/06/2018   Neuropathy 12/06/2018   Paresthesia 10/11/2018   Neck pain 10/11/2018   Tobacco abuse 11/20/2017   Right leg swelling 11/20/2017   Right leg pain 11/20/2017   Stroke (cerebrum) ([place]) 11/20/2017   GERD (gastroesophageal reflux disease) 11/20/2017   Acute venous embolism and thrombosis of deep vessels of proximal lower extremity, right ([place]) 11/20/2017   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    Thrush, oral    AKI (acute kidney injury) ([place])    Multifocal pneumonia 08/09/2017   ETD (Eustachian tube dysfunction), bilateral 08/03/2017   Acute recurrent pansinusitis 07/21/2017   Chest pain 09/29/2015   Cervical stenosis of spinal canal 01/08/2015   Urinary tract infectious disease    Muscle spasms of neck 06/15/2014   Bleeding hemorrhoid 06/15/2014   Anemia in chronic kidney disease 06/15/2014   Pyelonephritis, acute 06/13/2014   Sepsis ([place]) 06/13/2014   History of kidney transplant    Abnormal CT scan 09/14/2012   Chronic pain syndrome 04/09/2012   Dehydration, mild 04/09/2012   Herpes infection 06/23/2011   Anxiety 06/23/2011   E coli bacteremia 06/18/2011  ESRD (end stage renal disease) ([place]) 06/12/2011   UTI (lower urinary tract infection) 06/12/2011   Bacteremia due to Gram-negative bacteria 05/23/2011   History of renal transplantation 05/22/2011   Septic shock(785.52) 05/22/2011   Acute on chronic kidney failure ([place]) 05/22/2011   Gastroparesis 04/24/2008   WEIGHT LOSS 08/24/2007   NAUSEA AND VOMITING 08/24/2007    THERAPY DIAG:  Chronic left shoulder pain - Plan: PT plan of care cert/re-cert  Muscle weakness (generalized) - Plan: PT plan of care cert/re-cert  Chronic right shoulder pain - Plan: PT plan of care cert/re-cert   Rationale for Evaluation and Treatment Rehabilitation  REFERRING DIAG:  Bil shoulder adhesive capuslitis   PERTINENT HISTORY: Hx of stroke, DVT, CHF, Afib, Neuropathy, neck pain, gout, DDD, ESRD, cervical stenosis, dialysis (T, Th, sat), kidney transplant (failed)      PRECAUTIONS/RESTRICTIONS:   see pertinent history   SUBJECTIVE:  Pt states that her shoulder are doing a bit better recently.  She feels that massage has been very helpful.  Pain:  Are you having pain? Yes Pain location: diffuse shoulder pain R>L NPRS scale:  current 2/10  Aggravating factors:  reaching, lifting, activity Relieving factors: rest Pain description: constant and aching Stage: Chronic 24 hour pattern: worse with activity   OBJECTIVE: (objective measures completed at initial evaluation unless otherwise dated)  ASTERISK SIGNS     Asterisk Signs Eval (12/10/2021)  1/17  11/29  12/20 12/20  1/3  1/31  Functional ER To ear bil             Flexion  R 6.2 lbs, L 7.5 lbs             Comb hair unable   unable  Able to reach sides of head but not back      Able to reach almost to back & with decreased pain   flexion   L 90 R 100      Passive  L 130  R 130  Active L 100 R 90                    TREATMENT 03/23/22:  Therapeutic Exercise: - nu-step - L5 - 5' - chest press (reclined) - 2# x25 - elbow flexion/extension (reclined) - 2# 4x10 BIL - Low row - 3x10 - GTB - shoulder ext - RTB - 2x10 - Shoulder ER/IR YTB x12 each BIL  Manual: -STM for BIL upper traps; pt in sitting  Therapeutic Activity - collecting information for goals, checking progress, and reviewing with patient  Modalities: 10 min MHP following session, pt in reclined position on table   TREATMENT 03/03/22:  Therapeutic Exercise: - nu-step - L5 - 8' - 306 steps - chest press (reclined) - 0# - 3x10 - elbow flexion/extension (reclined) - 0# 3x10 BIL - Low row - 3x10 - YTB - shoulder ext - YTB - 3x10 - Shoulder ER/IR YTB x10 each BIL Modalities: 10 min MHP following session, pt in reclined position on table  TREATMENT 02/18/22: Therapeutic Exercise: - nu-step - L5 - 8' - 300 steps - chest press (reclined) - 0# - 3x10 - Low row - 3x10 - YTB - shoulder ext - YTB - 3x10 Modalities: 10 min MHP following session, pt in reclined position on table    ASSESSMENT:   CLINICAL IMPRESSION: Camryn has progressed well with therapy.  Improved impairments include: shoulder ROM, pain.  Functional improvements include: ability to reach into low cabinet with pain level </= 2/10.  Progressions needed  include: work on progressive strengthening of R/C.  Barriers to progress include: see medical history.  Mrs. Sula has made slow but consistent progress toward her goals.  She has significantly reduce pain and improved AROM of both shoulders.  She would like to continue PT.  She would like to do an additional 5 visits.  Given her progress, I think this is reasonable.  We discussed the attendance policy and that missing visits could result in discharge.  Please see GOALS section for progress on short term and long term goals established at evaluation.  I recommend continuation of PT to allow completion of remaining goals and continued functional progression.   OBJECTIVE IMPAIRMENTS: Pain, shoulder ROM, shoulder strength   ACTIVITY LIMITATIONS: housework, lifting, reaching   PERSONAL FACTORS: See medical history and pertinent history     REHAB POTENTIAL: Fair chronic, recurring, complex medical history   CLINICAL DECISION MAKING: Stable/uncomplicated   EVALUATION COMPLEXITY: Low     GOALS:     SHORT TERM GOALS: Target date: 01/07/2022   Roselene will be >75% HEP compliant to improve carryover between sessions and facilitate independent management of condition   Evaluation (12/10/2021): ongoing Goal status: MET 01/07/22: Pt reports adherence      LONG TERM GOALS: Target date: 02/04/2022 (extended to 04/27/2022 )   Sandria Bales will show a >/= 15 pt improvement in her QUICK DASH score (MCID is 13.4 pts or 8%) as a proxy for functional improvement    Evaluation/Baseline (12/10/2021): 29 pts 02/04/22: QuickDASH Score: 63.6 / 100 = 63.6 % 2/19: QuickDASH Score: 52.3 / 100 = 52.3 % Goal status: MET     2.  Raeleigh will be able to comb her hair, not limited by pain or ROM   Evaluation/Baseline (12/10/2021): limited 02/04/2022: continues to be limited d/t pain 2/19: 50% improvement Goal status: Progressing     3.  Cambre will be able to reach to low shelf, not limited by pain or ROM    Evaluation/Baseline (12/10/2021): limited 1/3: able to reach first shelf with some pain 2/19: 2/10 pain Goal status: MET     4.  Tateyana will demonstrate >110 degrees of active ROM in flexion to allow completion of activities involving reaching [place] [facility], not limited by pain   Evaluation/Baseline (12/10/2021):    UPPER EXTREMITY AROM:   AROM/PROM Right 12/10/2021 Left 12/10/2021 R 1/3 L 1/3 R L  Shoulder flexion 90/100 80/90 90/130 100/130 105/130 110/130  Shoulder abduction 70 70      Shoulder internal rotation          Shoulder external rotation          Functional IR PSIS PSIS      Functional ER ear ear      Shoulder extension          Elbow extension          Elbow flexion            (Blank rows = not tested, N = WNL, * = concordant pain with testing)  Goal status: progressing         PLAN: PT FREQUENCY: 1-2x/week   PT DURATION: Ending 3/25/202   PLANNED INTERVENTIONS: Therapeutic exercises, Aquatic therapy, Therapeutic activity, Neuro Muscular re-education, Gait training, Patient/Family education, Joint mobilization, Dry Needling, Electrical stimulation, Spinal mobilization and/or manipulation, Moist heat, Taping, Vasopneumatic device, Ionotophoresis 56m/ml Dexamethasone, and Manual therapy   PLAN FOR NEXT SESSION: progressive shoulder strengthening, MT for bil shoulder capsules, ROM progression   KPeterson Ao  E Jillyn Stacey PT 03/23/2022, 8:39 AM

## 2022-04-03 ENCOUNTER — Ambulatory Visit: Payer: Medicare Other | Attending: Internal Medicine | Admitting: Physical Therapy

## 2022-04-03 ENCOUNTER — Encounter: Payer: Self-pay | Admitting: Physical Therapy

## 2022-04-03 ENCOUNTER — Other Ambulatory Visit: Payer: Self-pay | Admitting: Gastroenterology

## 2022-04-03 DIAGNOSIS — M25612 Stiffness of left shoulder, not elsewhere classified: Secondary | ICD-10-CM | POA: Diagnosis present

## 2022-04-03 DIAGNOSIS — M25511 Pain in right shoulder: Secondary | ICD-10-CM | POA: Insufficient documentation

## 2022-04-03 DIAGNOSIS — M6281 Muscle weakness (generalized): Secondary | ICD-10-CM | POA: Diagnosis present

## 2022-04-03 DIAGNOSIS — M25512 Pain in left shoulder: Secondary | ICD-10-CM | POA: Insufficient documentation

## 2022-04-03 DIAGNOSIS — G8929 Other chronic pain: Secondary | ICD-10-CM | POA: Diagnosis present

## 2022-04-03 NOTE — Telephone Encounter (Signed)
This is a patient of Dr Ardis Hughs.  Please advise if OK to refill omeprazole as you are DOD pm.  Thank you

## 2022-04-03 NOTE — Therapy (Signed)
Treatment note  Patient Name: Tina Watkins MRN: [id number] DOB:August 04, 1965, 57 y.o., female Today's Date: 04/03/2022  PCP: Bo Merino I, NP   REFERRING PROVIDER: Cleophas Dunker, *   PT End of Session - 04/03/22 0747     Visit Number 14    Date for PT Re-Evaluation 04/27/22    Authorization Type MEDICARE PART A AND B; MEDICAID [place] ACCESS    Progress Note Due on Visit 58    PT Start Time 0745    PT Stop Time 0826    PT Time Calculation (min) 41 min    Activity Tolerance Patient tolerated treatment well;Patient limited by pain    Behavior During Therapy [facility] for tasks assessed/performed             Past Medical History:  Diagnosis Date   Bacteremia due to Gram-negative bacteria 05/23/2011   Blind    right eye   Blind right eye    CHF (congestive heart failure) ([place])    Chronic lower back pain    Complication of anesthesia    "woke up during OR; I have an extremely high tolerance" (12/11/2011) 1 procedure was graft; the other procedures were procedures that are typically done with sedation.   DDD (degenerative disc disease), cervical    Depression    Dysrhythmia    "tachycardia" (12/11/2011) new onset afib 10/15/14 EKG   E coli bacteremia 06/18/2011   Elevated LDL cholesterol level 12/2018   ESRD (end stage renal disease) ([place]) 06/12/2011   Fibromyalgia    Gastroparesis    Gastroparesis    GERD (gastroesophageal reflux disease)    Glaucoma    right eye   Gout    H/O carpal tunnel syndrome    Headache 10/2019   Headache(784.0)    "not often anymore" (12/11/2011)   Herpes genitalia 1994   History of blood transfusion    "more than a few times" (12/11/2011)   History of stomach ulcers    Hypotension    Iron deficiency anemia    New onset a-fib ([place])    10/15/14 EKG   Osteopenia    Peripheral neuropathy 11/2018   Pressure ulcer of sacral region, stage 1 07/2019   Seizures ([place]) 1994   "post transplant; only have had that one" (12/11/2011)    Spinal stenosis in cervical region    Stroke [facility])     left basal ganglia lacunar infarct; Right frontal lobe lacunar infarct.   Stroke [facility]) ~ 1999; 2001   "briefly lost my vision; lost my right eye" (12/11/2011)   Vitamin D deficiency 12/2018   Past Surgical History:  Procedure Laterality Date   ANTERIOR CERVICAL DECOMP/DISCECTOMY FUSION N/A 01/08/2015   Procedure: Anterior Cervical Three-Four/Four-Five Decompression/Diskectomy/Fusion;  Surgeon: Leeroy Cha, MD;  Location: [place] NEURO ORS;  Service: Neurosurgery;  Laterality: N/A;  C3-4 C4-5 Anterior cervical decompression/diskectomy/fusion   APPENDECTOMY  ~ 2004   BIOPSY  09/12/2020   Procedure: BIOPSY;  Surgeon: Milus Banister, MD;  Location: [facility] ENDOSCOPY;  Service: Endoscopy;;   CATARACT EXTRACTION     right eye   COLONOSCOPY     COLONOSCOPY WITH PROPOFOL N/A 09/12/2020   Procedure: COLONOSCOPY WITH PROPOFOL;  Surgeon: Milus Banister, MD;  Location: [facility] ENDOSCOPY;  Service: Endoscopy;  Laterality: N/A;   ENUCLEATION  2001   "right"   ESOPHAGOGASTRODUODENOSCOPY (EGD) WITH PROPOFOL N/A 04/21/2012   Procedure: ESOPHAGOGASTRODUODENOSCOPY (EGD) WITH PROPOFOL;  Surgeon: Milus Banister, MD;  Location: [facility] ENDOSCOPY;  Service: Endoscopy;  Laterality: N/A;  ESOPHAGOGASTRODUODENOSCOPY (EGD) WITH PROPOFOL N/A 09/12/2020   Procedure: ESOPHAGOGASTRODUODENOSCOPY (EGD) WITH PROPOFOL;  Surgeon: Milus Banister, MD;  Location: [facility] ENDOSCOPY;  Service: Endoscopy;  Laterality: N/A;   INSERTION OF DIALYSIS CATHETER  1988   "AV graft LUA & LFA; LUA worked for 1 day; LFA never worked"   IR FLUORO GUIDE CV LINE RIGHT  07/11/2019   IR FLUORO GUIDE CV LINE RIGHT  10/20/2019   IR FLUORO GUIDE CV LINE RIGHT  05/31/2020   IR PTA VENOUS EXCEPT DIALYSIS CIRCUIT  05/31/2020   IR RADIOLOGY PERIPHERAL GUIDED IV START  07/11/2019   IR US GUIDE VASC ACCESS RIGHT  07/11/2019   IR US GUIDE VASC ACCESS RIGHT  07/11/2019   IR US GUIDE VASC ACCESS RIGHT  07/11/2019   IR  VENOCAVAGRAM IVC  05/31/2020   KIDNEY TRANSPLANT  1994; 1999; 2005   "right"   MULTIPLE TOOTH EXTRACTIONS     POLYPECTOMY  09/12/2020   Procedure: POLYPECTOMY;  Surgeon: Milus Banister, MD;  Location: [facility] ENDOSCOPY;  Service: Endoscopy;;   RIGHT HEART CATH N/A 03/23/2019   Procedure: RIGHT HEART CATH;  Surgeon: Jolaine Artist, MD;  Location: [place] CV LAB;  Service: Cardiovascular;  Laterality: N/A;   [place]?; 1994; 2005   Patient Active Problem List   Diagnosis Date Noted   Bilateral hip pain 01/14/2022   Prolonged Q-T interval on ECG 09/07/2021   Nausea & vomiting 09/06/2021   Bilateral shoulder pain 06/06/2021   Bilateral hand swelling 06/06/2021   Cervical radiculitis 01/24/2021   Acute respiratory failure with hypoxia ([place]) 10/23/2020   Coagulation defect, unspecified ([place]) 08/01/2020   Aortic atherosclerosis ([place]) 07/10/2020   Secondary hypercoagulable state ([place]) 04/23/2020   Atrial flutter ([place]) 03/29/2020   Chronic pain of both knees 03/13/2020   Chills (without fever) 12/12/2019   Other pancytopenia ([place])    Decubitus ulcer of sacral region, stage 1 08/18/2019   HCAP (healthcare-associated pneumonia) 08/05/2019   Pressure injury of skin 07/24/2019   Chest congestion 07/24/2019   Itching 07/24/2019   Weakness 06/13/2019   Pneumonia due to COVID-19 virus 05/09/2019   Hypotension 05/09/2019   Symptomatic anemia 05/09/2019   CHF (congestive heart failure) ([place]) 05/09/2019   Swollen abdomen 01/04/2019   DDD (degenerative disc disease), cervical 12/06/2018   Neuropathy 12/06/2018   Paresthesia 10/11/2018   Neck pain 10/11/2018   Tobacco abuse 11/20/2017   Right leg swelling 11/20/2017   Right leg pain 11/20/2017   Stroke (cerebrum) ([place]) 11/20/2017   GERD (gastroesophageal reflux disease) 11/20/2017   Acute venous embolism and thrombosis of deep vessels of proximal lower extremity, right ([place]) 11/20/2017   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)    Multifocal pneumonia 08/09/2017   ETD (Eustachian tube dysfunction), bilateral 08/03/2017   Acute recurrent pansinusitis 07/21/2017   Chest pain 09/29/2015   Cervical stenosis of spinal canal 01/08/2015   Urinary tract infectious disease    Muscle spasms of neck 06/15/2014   Bleeding hemorrhoid 06/15/2014   Anemia in chronic kidney disease 06/15/2014   Pyelonephritis, acute 06/13/2014   Sepsis ([place]) 06/13/2014   History of kidney transplant    Abnormal CT scan 09/14/2012   Chronic pain syndrome 04/09/2012   Dehydration, mild 04/09/2012   Herpes infection 06/23/2011   Anxiety 06/23/2011   E coli bacteremia 06/18/2011  ESRD (end stage renal disease) ([place]) 06/12/2011   UTI (lower urinary tract infection) 06/12/2011   Bacteremia due to Gram-negative bacteria 05/23/2011   History of renal transplantation 05/22/2011   Septic shock(785.52) 05/22/2011   Acute on chronic kidney failure ([place]) 05/22/2011   Gastroparesis 04/24/2008   WEIGHT LOSS 08/24/2007   NAUSEA AND VOMITING 08/24/2007    THERAPY DIAG:  Chronic left shoulder pain  Muscle weakness (generalized)  Chronic right shoulder pain  Stiffness of left shoulder, not elsewhere classified   Rationale for Evaluation and Treatment Rehabilitation  REFERRING DIAG:  Bil shoulder adhesive capuslitis   PERTINENT HISTORY: Hx of stroke, DVT, CHF, Afib, Neuropathy, neck pain, gout, DDD, ESRD, cervical stenosis, dialysis (T, Th, sat), kidney transplant (failed)      PRECAUTIONS/RESTRICTIONS:   see pertinent history   SUBJECTIVE:  Pt reports that her shoulder are doing very well.  She reports that she was very close to being able to put her hair into a ponytail.  Pain:  Are you having pain? Yes Pain location: diffuse shoulder pain R>L NPRS scale:  current 2/10  Aggravating factors: reaching, lifting,  activity Relieving factors: rest Pain description: constant and aching Stage: Chronic 24 hour pattern: worse with activity   OBJECTIVE: (objective measures completed at initial evaluation unless otherwise dated)  ASTERISK SIGNS     Asterisk Signs Eval (12/10/2021)  1/17  11/29  12/20 12/20  1/3  1/31  Functional ER To ear bil             Flexion  R 6.2 lbs, L 7.5 lbs             Comb hair unable   unable  Able to reach sides of head but not back      Able to reach almost to back & with decreased pain   flexion   L 90 R 100      Passive  L 130  R 130  Active L 100 R 90                    TREATMENT 03/23/22:  Therapeutic Exercise: - nu-step - L5 - 5' - shoulder pulley - 2 min - chest press (reclined) - 2.5# 3x10 - elbow flexion/extension (reclined) - 2.5# 2x10 BIL - Low row - 3x10 - Blue TB - shoulder ext - GTB - 2x10 - Shoulder ER/IR YTB 15 each  Modalities: 10 min MHP following session, pt in reclined position on table   TREATMENT 03/03/22:  Therapeutic Exercise: - nu-step - L5 - 8' - 306 steps - chest press (reclined) - 0# - 3x10 - elbow flexion/extension (reclined) - 0# 3x10 BIL - Low row - 3x10 - YTB - shoulder ext - YTB - 3x10 - Shoulder ER/IR YTB x10 each BIL Modalities: 10 min MHP following session, pt in reclined position on table  TREATMENT 02/18/22: Therapeutic Exercise: - nu-step - L5 - 8' - 300 steps - chest press (reclined) - 0# - 3x10 - Low row - 3x10 - YTB - shoulder ext - YTB - 3x10 Modalities: 10 min MHP following session, pt in reclined position on table    ASSESSMENT:   CLINICAL IMPRESSION: Tina Watkins tolerated session well with no adverse reaction.  Able to progress load with multiple exercises today.  Pt pain consistently lower over recent sessions and seeing progression at home with ability to comb hair (back of head).   OBJECTIVE IMPAIRMENTS: Pain, shoulder ROM, shoulder strength   ACTIVITY LIMITATIONS: housework, lifting,  reaching    PERSONAL FACTORS: See medical history and pertinent history     REHAB POTENTIAL: Fair chronic, recurring, complex medical history   CLINICAL DECISION MAKING: Stable/uncomplicated   EVALUATION COMPLEXITY: Low     GOALS:     SHORT TERM GOALS: Target date: 01/07/2022   Tina Watkins will be >75% HEP compliant to improve carryover between sessions and facilitate independent management of condition   Evaluation (12/10/2021): ongoing Goal status: MET 01/07/22: Pt reports adherence      LONG TERM GOALS: Target date: 02/04/2022 (extended to 04/27/2022 )   Tina Watkins will show a >/= 15 pt improvement in her QUICK DASH score (MCID is 13.4 pts or 8%) as a proxy for functional improvement    Evaluation/Baseline (12/10/2021): 29 pts 02/04/22: QuickDASH Score: 63.6 / 100 = 63.6 % 2/19: QuickDASH Score: 52.3 / 100 = 52.3 % Goal status: MET     2.  Tina Watkins will be able to comb her hair, not limited by pain or ROM   Evaluation/Baseline (12/10/2021): limited 02/04/2022: continues to be limited d/t pain 2/19: 50% improvement Goal status: Progressing     3.  Tina Watkins will be able to reach to low shelf, not limited by pain or ROM   Evaluation/Baseline (12/10/2021): limited 1/3: able to reach first shelf with some pain 2/19: 2/10 pain Goal status: MET     4.  Tina Watkins will demonstrate >110 degrees of active ROM in flexion to allow completion of activities involving reaching [place], not limited by pain   Evaluation/Baseline (12/10/2021):    UPPER EXTREMITY AROM:   AROM/PROM Right 12/10/2021 Left 12/10/2021 R 1/3 L 1/3 R L  Shoulder flexion 90/100 80/90 90/130 100/130 105/130 110/130  Shoulder abduction 70 70      Shoulder internal rotation          Shoulder external rotation          Functional IR PSIS PSIS      Functional ER ear ear      Shoulder extension          Elbow extension          Elbow flexion            (Blank rows = not tested, N = WNL, * = concordant pain with testing)  Goal  status: progressing         PLAN: PT FREQUENCY: 1-2x/week   PT DURATION: Ending 3/25/202   PLANNED INTERVENTIONS: Therapeutic exercises, Aquatic therapy, Therapeutic activity, Neuro Muscular re-education, Gait training, Patient/Family education, Joint mobilization, Dry Needling, Electrical stimulation, Spinal mobilization and/or manipulation, Moist heat, Taping, Vasopneumatic device, Ionotophoresis '4mg'$ /ml Dexamethasone, and Manual therapy   PLAN FOR NEXT SESSION: progressive shoulder strengthening, MT for bil shoulder capsules, ROM progression   Kevan Ny Zelma Mazariego PT 04/03/2022, 8:30 AM

## 2022-04-10 ENCOUNTER — Ambulatory Visit: Payer: Medicare Other | Admitting: Physical Therapy

## 2022-04-17 ENCOUNTER — Ambulatory Visit: Payer: Medicare Other | Admitting: Physical Therapy

## 2022-04-17 ENCOUNTER — Encounter: Payer: Self-pay | Admitting: Physical Therapy

## 2022-04-17 DIAGNOSIS — M25612 Stiffness of left shoulder, not elsewhere classified: Secondary | ICD-10-CM

## 2022-04-17 DIAGNOSIS — M25512 Pain in left shoulder: Secondary | ICD-10-CM | POA: Diagnosis not present

## 2022-04-17 DIAGNOSIS — G8929 Other chronic pain: Secondary | ICD-10-CM

## 2022-04-17 DIAGNOSIS — M6281 Muscle weakness (generalized): Secondary | ICD-10-CM

## 2022-04-17 NOTE — Therapy (Addendum)
PHYSICAL THERAPY UNPLANNED DISCHARGE SUMMARY   Visits from Start of Care: 15  Current functional level related to goals / functional outcomes: Current status unknown   Remaining deficits: Current status unknown   Education / Equipment: Pt has not returned since visit listed below  Patient goals were not assessed. Patient is being discharged due to not returning since the last visit.  (the below note was addended to include the above D/C summary on 05/12/22)  Patient Name: Tina Watkins MRN: [id number] DOB:07-Aug-1965, 57 y.o., female Today's Date: 04/17/2022  PCP: Orion Crook I, NP   REFERRING PROVIDER: Unknown Jim, *   PT End of Session - 04/17/22 0759     Visit Number 15    Date for PT Re-Evaluation 04/27/22    Authorization Type MEDICARE PART A AND B; MEDICAID [place] ACCESS    Progress Note Due on Visit 17    PT Start Time 0800   pt arrived late   PT Stop Time 0830    PT Time Calculation (min) 30 min    Activity Tolerance Patient tolerated treatment well;Patient limited by pain    Behavior During Therapy [facility] for tasks assessed/performed             Past Medical History:  Diagnosis Date   Bacteremia due to Gram-negative bacteria 05/23/2011   Blind    right eye   Blind right eye    CHF (congestive heart failure) (HCC)    Chronic lower back pain    Complication of anesthesia    "woke up during OR; I have an extremely high tolerance" (12/11/2011) 1 procedure was graft; the other procedures were procedures that are typically done with sedation.   DDD (degenerative disc disease), cervical    Depression    Dysrhythmia    "tachycardia" (12/11/2011) new onset afib 10/15/14 EKG   E coli bacteremia 06/18/2011   Elevated LDL cholesterol level 12/2018   ESRD (end stage renal disease) (HCC) 06/12/2011   Fibromyalgia    Gastroparesis    Gastroparesis    GERD (gastroesophageal reflux disease)    Glaucoma    right eye   Gout    H/O carpal tunnel  syndrome    Headache 10/2019   Headache(784.0)    "not often anymore" (12/11/2011)   Herpes genitalia 1994   History of blood transfusion    "more than a few times" (12/11/2011)   History of stomach ulcers    Hypotension    Iron deficiency anemia    New onset a-fib (HCC)    10/15/14 EKG   Osteopenia    Peripheral neuropathy 11/2018   Pressure ulcer of sacral region, stage 1 07/2019   Seizures (HCC) 1994   "post transplant; only have had that one" (12/11/2011)   Spinal stenosis in cervical region    Stroke [facility])     left basal ganglia lacunar infarct; Right frontal lobe lacunar infarct.   Stroke [facility]) ~ 1999; 2001   "briefly lost my vision; lost my right eye" (12/11/2011)   Vitamin D deficiency 12/2018   Past Surgical History:  Procedure Laterality Date   ANTERIOR CERVICAL DECOMP/DISCECTOMY FUSION N/A 01/08/2015   Procedure: Anterior Cervical Three-Four/Four-Five Decompression/Diskectomy/Fusion;  Surgeon: Hilda Lias, MD;  Location: [facility] NEURO ORS;  Service: Neurosurgery;  Laterality: N/A;  C3-4 C4-5 Anterior cervical decompression/diskectomy/fusion   APPENDECTOMY  ~ 2004   BIOPSY  09/12/2020   Procedure: BIOPSY;  Surgeon: Rachael Fee, MD;  Location: [facility] ENDOSCOPY;  Service: Endoscopy;;   CATARACT  EXTRACTION     right eye   COLONOSCOPY     COLONOSCOPY WITH PROPOFOL N/A 09/12/2020   Procedure: COLONOSCOPY WITH PROPOFOL;  Surgeon: Rachael Fee, MD;  Location: [facility] ENDOSCOPY;  Service: Endoscopy;  Laterality: N/A;   ENUCLEATION  2001   "right"   ESOPHAGOGASTRODUODENOSCOPY (EGD) WITH PROPOFOL N/A 04/21/2012   Procedure: ESOPHAGOGASTRODUODENOSCOPY (EGD) WITH PROPOFOL;  Surgeon: Rachael Fee, MD;  Location: [facility] ENDOSCOPY;  Service: Endoscopy;  Laterality: N/A;   ESOPHAGOGASTRODUODENOSCOPY (EGD) WITH PROPOFOL N/A 09/12/2020   Procedure: ESOPHAGOGASTRODUODENOSCOPY (EGD) WITH PROPOFOL;  Surgeon: Rachael Fee, MD;  Location: [facility] ENDOSCOPY;  Service: Endoscopy;  Laterality: N/A;    INSERTION OF DIALYSIS CATHETER  1988   "AV graft LUA & LFA; LUA worked for 1 day; LFA never worked"   IR FLUORO GUIDE CV LINE RIGHT  07/11/2019   IR FLUORO GUIDE CV LINE RIGHT  10/20/2019   IR FLUORO GUIDE CV LINE RIGHT  05/31/2020   IR PTA VENOUS EXCEPT DIALYSIS CIRCUIT  05/31/2020   IR RADIOLOGY PERIPHERAL GUIDED IV START  07/11/2019   IR US GUIDE VASC ACCESS RIGHT  07/11/2019   IR US GUIDE VASC ACCESS RIGHT  07/11/2019   IR US GUIDE VASC ACCESS RIGHT  07/11/2019   IR VENOCAVAGRAM IVC  05/31/2020   KIDNEY TRANSPLANT  1994; 1999; 2005   "right"   MULTIPLE TOOTH EXTRACTIONS     POLYPECTOMY  09/12/2020   Procedure: POLYPECTOMY;  Surgeon: Rachael Fee, MD;  Location: [facility] ENDOSCOPY;  Service: Endoscopy;;   RIGHT HEART CATH N/A 03/23/2019   Procedure: RIGHT HEART CATH;  Surgeon: Dolores Patty, MD;  Location: [facility] INVASIVE CV LAB;  Service: Cardiovascular;  Laterality: N/A;   TONSILLECTOMY     TOTAL NEPHRECTOMY  1988?; 1994; 2005   Patient Active Problem List   Diagnosis Date Noted   Bilateral hip pain 01/14/2022   Prolonged Q-T interval on ECG 09/07/2021   Nausea & vomiting 09/06/2021   Bilateral shoulder pain 06/06/2021   Bilateral hand swelling 06/06/2021   Cervical radiculitis 01/24/2021   Acute respiratory failure with hypoxia (HCC) 10/23/2020   Coagulation defect, unspecified (HCC) 08/01/2020   Aortic atherosclerosis (HCC) 07/10/2020   Secondary hypercoagulable state (HCC) 04/23/2020   Atrial flutter (HCC) 03/29/2020   Chronic pain of both knees 03/13/2020   Chills (without fever) 12/12/2019   Other pancytopenia (HCC)    Decubitus ulcer of sacral region, stage 1 08/18/2019   HCAP (healthcare-associated pneumonia) 08/05/2019   Pressure injury of skin 07/24/2019   Chest congestion 07/24/2019   Itching 07/24/2019   Weakness 06/13/2019   Pneumonia due to COVID-19 virus 05/09/2019   Hypotension 05/09/2019   Symptomatic anemia 05/09/2019   CHF (congestive heart failure) (HCC)  05/09/2019   Swollen abdomen 01/04/2019   DDD (degenerative disc disease), cervical 12/06/2018   Neuropathy 12/06/2018   Paresthesia 10/11/2018   Neck pain 10/11/2018   Tobacco abuse 11/20/2017   Right leg swelling 11/20/2017   Right leg pain 11/20/2017   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   GERD (gastroesophageal reflux disease) 11/20/2017   Acute venous embolism and thrombosis of deep vessels of proximal lower extremity, right (HCC) 11/20/2017   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    Thrush, oral    AKI (acute kidney injury) (HCC)    Multifocal pneumonia 08/09/2017   ETD (Eustachian tube dysfunction), bilateral 08/03/2017   Acute recurrent pansinusitis 07/21/2017   Chest pain 09/29/2015   Cervical  stenosis of spinal canal 01/08/2015   Urinary tract infectious disease    Muscle spasms of neck 06/15/2014   Bleeding hemorrhoid 06/15/2014   Anemia in chronic kidney disease 06/15/2014   Pyelonephritis, acute 06/13/2014   Sepsis (HCC) 06/13/2014   History of kidney transplant    Abnormal CT scan 09/14/2012   Chronic pain syndrome 04/09/2012   Dehydration, mild 04/09/2012   Herpes infection 06/23/2011   Anxiety 06/23/2011   E coli bacteremia 06/18/2011   ESRD (end stage renal disease) (HCC) 06/12/2011   UTI (lower urinary tract infection) 06/12/2011   Bacteremia due to Gram-negative bacteria 05/23/2011   History of renal transplantation 05/22/2011   Septic shock(785.52) 05/22/2011   Acute on chronic kidney failure (HCC) 05/22/2011   Gastroparesis 04/24/2008   WEIGHT LOSS 08/24/2007   NAUSEA AND VOMITING 08/24/2007    THERAPY DIAG:  Chronic left shoulder pain  Muscle weakness (generalized)  Chronic right shoulder pain  Stiffness of left shoulder, not elsewhere classified   Rationale for Evaluation and Treatment Rehabilitation  REFERRING DIAG:  Bil shoulder adhesive capuslitis   PERTINENT HISTORY: Hx of stroke, DVT, CHF,  Afib, Neuropathy, neck pain, gout, DDD, ESRD, cervical stenosis, dialysis (T, Th, sat), kidney transplant (failed)      PRECAUTIONS/RESTRICTIONS:   see pertinent history   SUBJECTIVE:  Pt reports that she is more tired today d/t missing dialysis.  She reports some pain in her R shoulder.  Pain:  Are you having pain? Yes Pain location: diffuse shoulder pain R>L NPRS scale:  current 7/10  Aggravating factors: reaching, lifting, activity Relieving factors: rest Pain description: constant and aching Stage: Chronic 24 hour pattern: worse with activity   OBJECTIVE: (objective measures completed at initial evaluation unless otherwise dated)  ASTERISK SIGNS     Asterisk Signs Eval (12/10/2021)  1/17  11/29  12/20 12/20  1/3  1/31  Functional ER To ear bil             Flexion  R 6.2 lbs, L 7.5 lbs             Comb hair unable   unable  Able to reach sides of head but not back      Able to reach almost to back & with decreased pain   flexion   L 90 R 100      Passive  L 130  R 130  Active L 100 R 90                    TREATMENT 04/17/22:  Therapeutic Exercise: - nu-step - L5 - 5' - chest press (reclined) - 3# 3x10 - elbow flexion/extension (reclined) - 3# 2x10 BIL - Low row - 3x10 - Black TB - shoulder ext - GTB - 2x10 - Shoulder ER/IR YTB/RTB 15x each  Modalities: 10 min MHP following session, pt in reclined position on table   TREATMENT 03/03/22:  Therapeutic Exercise: - nu-step - L5 - 8' - 306 steps - chest press (reclined) - 0# - 3x10 - elbow flexion/extension (reclined) - 0# 3x10 BIL - Low row - 3x10 - YTB - shoulder ext - YTB - 3x10 - Shoulder ER/IR YTB x10 each BIL Modalities: 10 min MHP following session, pt in reclined position on table  TREATMENT 02/18/22: Therapeutic Exercise: - nu-step - L5 - 8' - 300 steps - chest press (reclined) - 0# - 3x10 - Low row - 3x10 - YTB - shoulder ext - YTB - 3x10 Modalities: 10 min  MHP following session, pt in reclined  position on table    ASSESSMENT:   CLINICAL IMPRESSION: Tina Watkins tolerated session well with no adverse reaction.  Despite not feeling well today Tina Watkins was able to progress intensity of her current exercises.  At this point she is nearing independence with her current exercises and we will plan to d/c on schedule.   OBJECTIVE IMPAIRMENTS: Pain, shoulder ROM, shoulder strength   ACTIVITY LIMITATIONS: housework, lifting, reaching   PERSONAL FACTORS: See medical history and pertinent history     REHAB POTENTIAL: Fair chronic, recurring, complex medical history   CLINICAL DECISION MAKING: Stable/uncomplicated   EVALUATION COMPLEXITY: Low     GOALS:     SHORT TERM GOALS: Target date: 01/07/2022   Tina Watkins will be >75% HEP compliant to improve carryover between sessions and facilitate independent management of condition   Evaluation (12/10/2021): ongoing Goal status: MET 01/07/22: Pt reports adherence      LONG TERM GOALS: Target date: 02/04/2022 (extended to 04/27/2022 )   Tina Watkins will show a >/= 15 pt improvement in her QUICK DASH score (MCID is 13.4 pts or 8%) as a proxy for functional improvement    Evaluation/Baseline (12/10/2021): 29 pts 02/04/22: QuickDASH Score: 63.6 / 100 = 63.6 % 2/19: QuickDASH Score: 52.3 / 100 = 52.3 % Goal status: MET     2.  Tina Watkins will be able to comb her hair, not limited by pain or ROM   Evaluation/Baseline (12/10/2021): limited 02/04/2022: continues to be limited d/t pain 2/19: 50% improvement Goal status: Progressing     3.  Tina Watkins will be able to reach to low shelf, not limited by pain or ROM   Evaluation/Baseline (12/10/2021): limited 1/3: able to reach first shelf with some pain 2/19: 2/10 pain Goal status: MET     4.  Tina Watkins will demonstrate >110 degrees of active ROM in flexion to allow completion of activities involving reaching OH, not limited by pain   Evaluation/Baseline (12/10/2021):    UPPER EXTREMITY AROM:    AROM/PROM Right 12/10/2021 Left 12/10/2021 R 1/3 L 1/3 R L  Shoulder flexion 90/100 80/90 90/130 100/130 105/130 110/130  Shoulder abduction 70 70      Shoulder internal rotation          Shoulder external rotation          Functional IR PSIS PSIS      Functional ER ear ear      Shoulder extension          Elbow extension          Elbow flexion            (Blank rows = not tested, N = WNL, * = concordant pain with testing)  Goal status: progressing         PLAN: PT FREQUENCY: 1-2x/week   PT DURATION: Ending 3/25/202   PLANNED INTERVENTIONS: Therapeutic exercises, Aquatic therapy, Therapeutic activity, Neuro Muscular re-education, Gait training, Patient/Family education, Joint mobilization, Dry Needling, Electrical stimulation, Spinal mobilization and/or manipulation, Moist heat, Taping, Vasopneumatic device, Ionotophoresis 4mg /ml Dexamethasone, and Manual therapy   PLAN FOR NEXT SESSION: progressive shoulder strengthening, MT for bil shoulder capsules, ROM progression   Cidney Kirkwood PT 04/17/2022, 9:40 AM

## 2022-04-19 ENCOUNTER — Other Ambulatory Visit (HOSPITAL_COMMUNITY): Payer: Self-pay | Admitting: Family Medicine

## 2022-04-24 ENCOUNTER — Ambulatory Visit: Payer: Medicare Other | Admitting: Physical Therapy

## 2022-04-29 ENCOUNTER — Ambulatory Visit: Payer: Medicare Other | Admitting: Physical Therapy

## 2022-05-04 ENCOUNTER — Telehealth: Payer: Self-pay | Admitting: Pulmonary Disease

## 2022-05-04 NOTE — Telephone Encounter (Signed)
Spoke with pharmacy. They had no clue what I was talking about. I advised patient had not been seen in a year and has only be prescribed Advair by this office. I was transferred to speak with the pharmacist to verify. I was only hold for at least 10 minutes, then hung up. Closing encounter since nothing is needed.

## 2022-05-04 NOTE — Telephone Encounter (Signed)
Pharmacy needs pt conditions/ reasons for the medicine and the directions of how it should be taken

## 2022-05-09 ENCOUNTER — Other Ambulatory Visit (HOSPITAL_COMMUNITY): Payer: Self-pay | Admitting: Family Medicine

## 2022-05-19 IMAGING — MR MR KNEE*L* W/O CM
5 series · 38 of 40 positions shown · non-contrast
Comparison: Radiographs 12/13/2019.

CLINICAL DATA: Chronic knee pain. No known injury. Osteoarthritis
suspected.

EXAM:
MRI OF THE LEFT KNEE WITHOUT CONTRAST
TECHNIQUE: Multiplanar, multisequence MR imaging of the knee was performed. No
intravenous contrast was administered.

[Series 6: T1 · coronal · left · 4.0mm · 0.29mm/px · 6 of 25 slices shown]
[im 1/25]
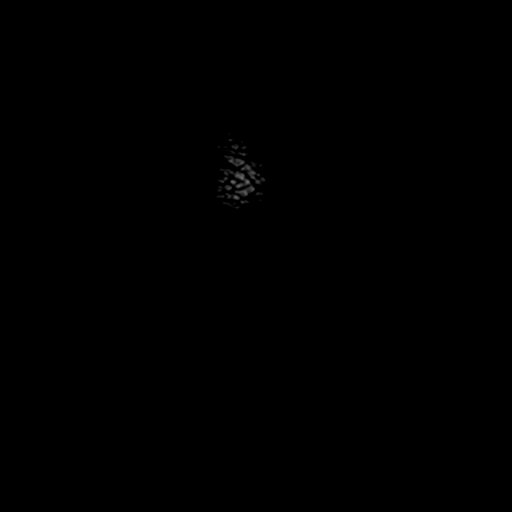
[im 4/25]
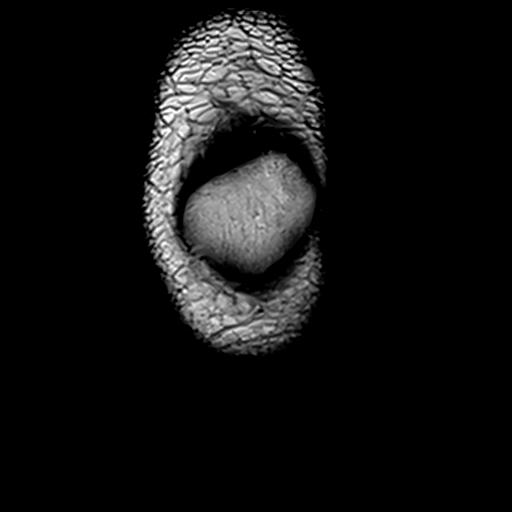
[im 7/25]
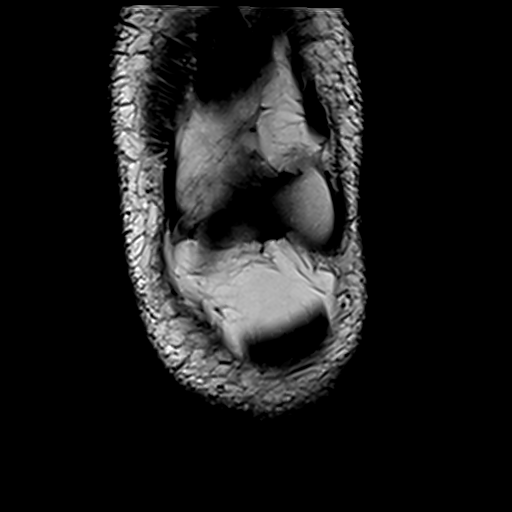
[im 11/25]
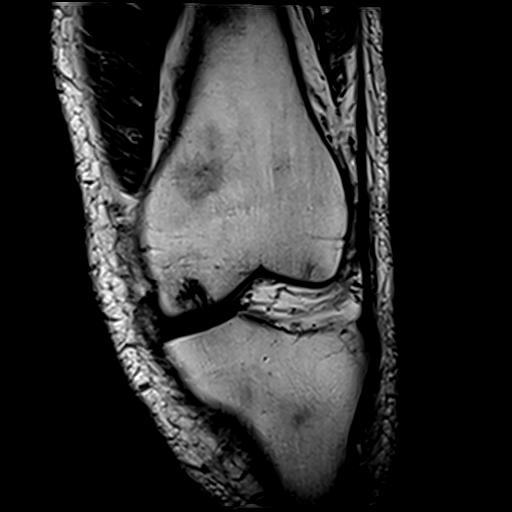
[im 14/25]
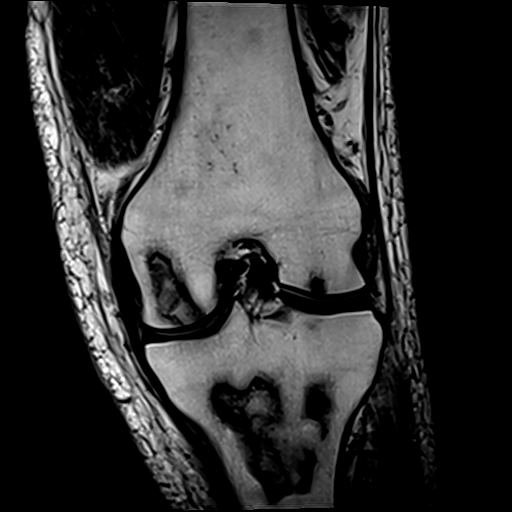
[im 18/25]
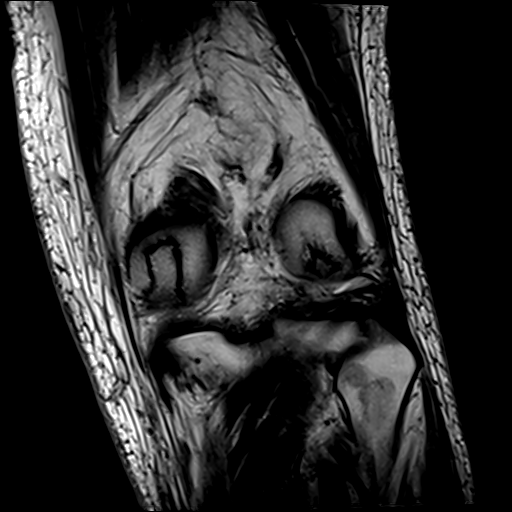

[Series 7: T2 fat-sat · coronal · left · 4.0mm · 0.59mm/px · 8 of 25 slices shown (1 of 2)]
[im 1/25]
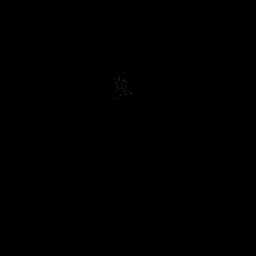
[im 4/25]
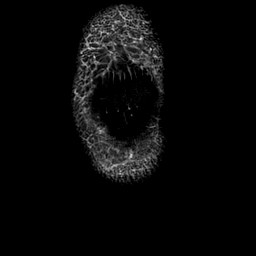
[im 7/25]
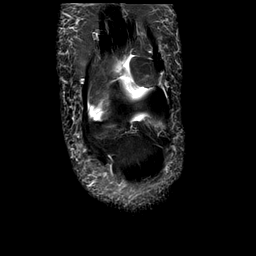
[im 11/25]
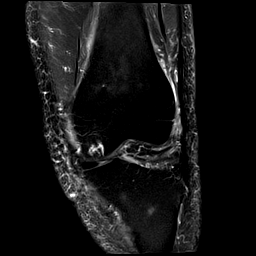
[im 14/25]
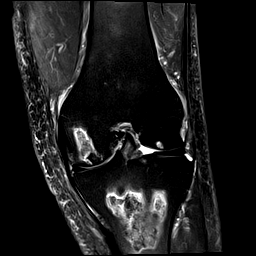
[im 18/25]
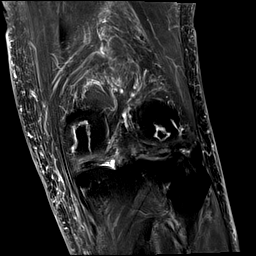
[im 21/25]
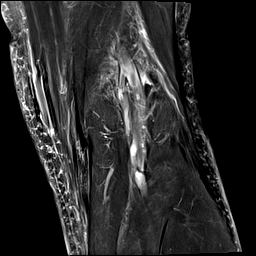
[im 25/25]
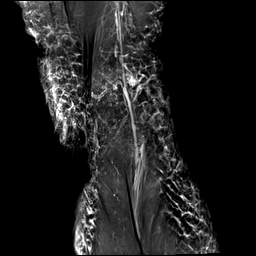

[Series 8: PD fat-sat · coronal · left · 4.0mm · 0.47mm/px · 8 of 25 slices shown (1 of 2)]
[im 1/25]
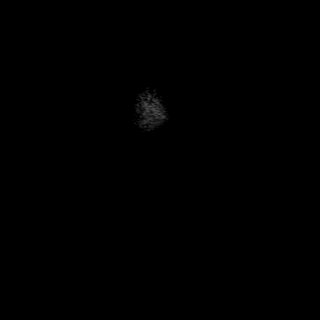
[im 4/25]
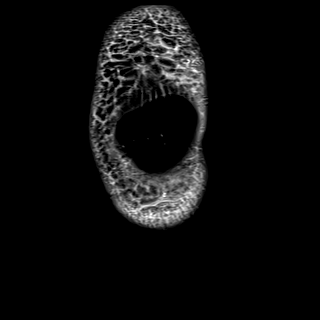
[im 7/25]
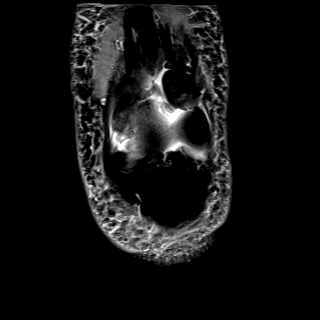
[im 11/25]
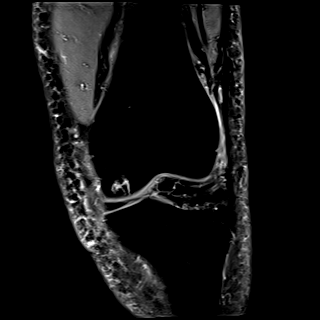
[im 14/25]
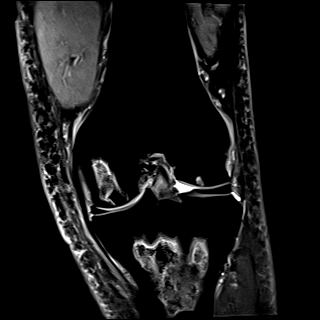
[im 18/25]
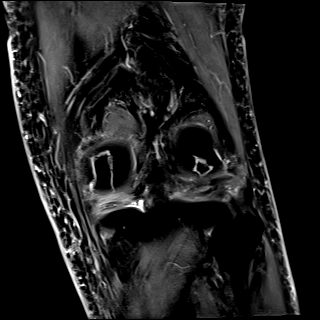
[im 21/25]
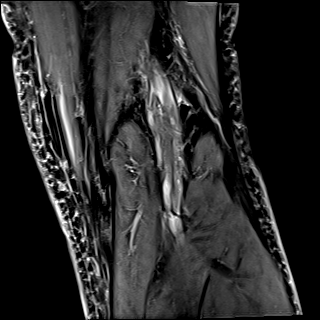
[im 25/25]
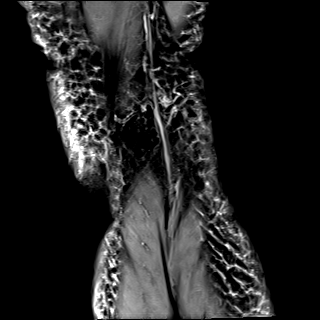

[Series 9: PD fat-sat · sagittal · left · 4.0mm · 0.47mm/px · 8 of 24 slices shown (2 of 2)]
[im 1/24]
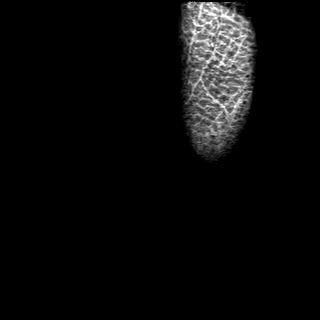
[im 4/24]
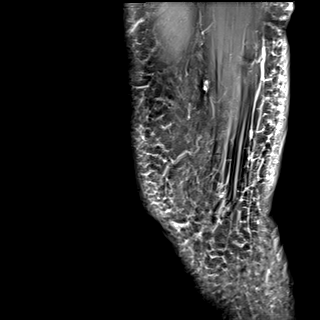
[im 7/24]
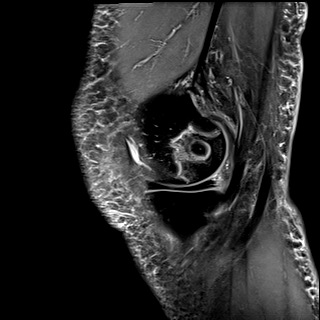
[im 10/24]
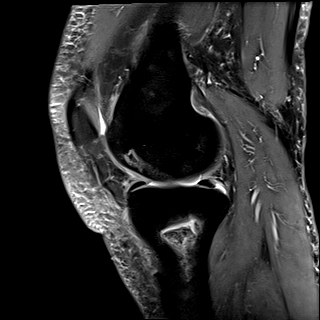
[im 14/24]
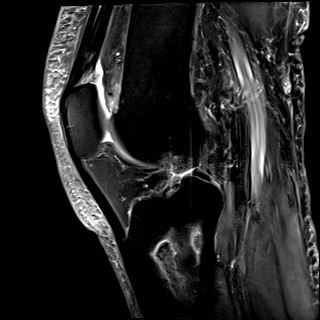
[im 17/24]
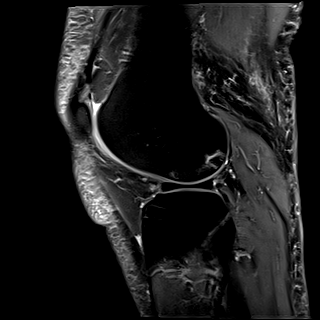
[im 20/24]
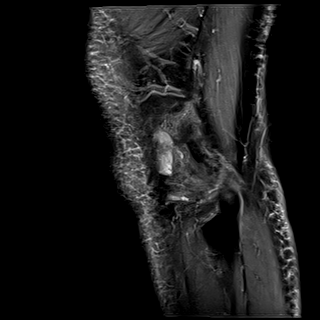
[im 24/24]
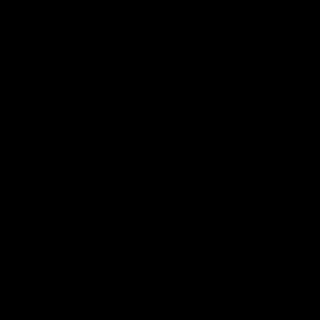

[Series 10: T2 fat-sat · sagittal · left · 4.0mm · 0.47mm/px · 8 of 24 slices shown (2 of 2)]
[im 1/24]
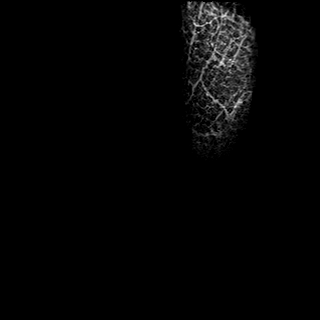
[im 4/24]
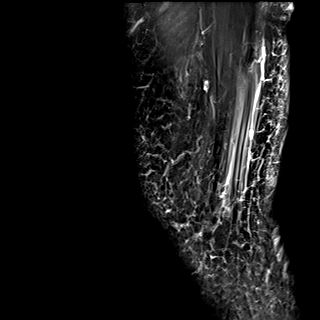
[im 7/24]
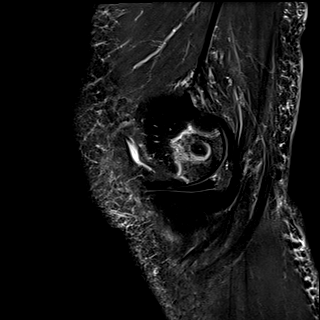
[im 10/24]
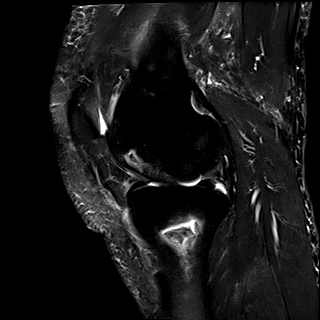
[im 14/24]
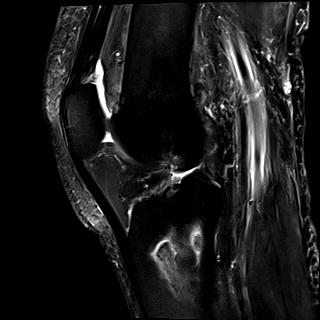
[im 17/24]
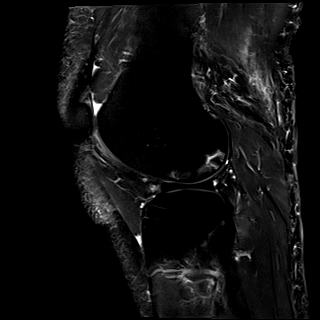
[im 20/24]
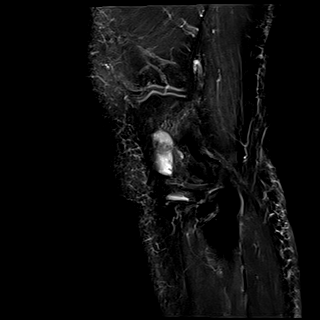
[im 24/24]
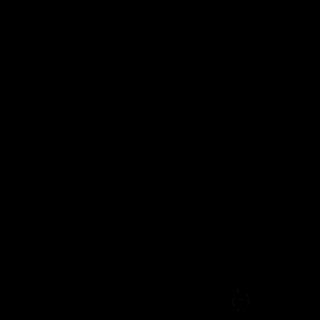

[38 of 40 positions shown; findings below may reference images not displayed]

FINDINGS: MENISCI

Medial meniscus:  Intact with normal morphology.

Lateral meniscus:  Discoid configuration without evidence of tear.

LIGAMENTS

Cruciates:  Intact.

Collaterals:  Intact.

CARTILAGE

Patellofemoral:  Preserved.

Medial:  Mild chondral thinning without focal defect.

Lateral:  Preserved.

MISCELLANEOUS

Joint:  No significant joint effusion.

Popliteal Fossa:  Unremarkable. No significant Baker's cyst.

Extensor Mechanism:  Intact.

Bones: There are multiple medullary infarcts, measuring up to 3.9 cm
in the tibial metaphysis. There are smaller bone infarcts within
both femoral condyles. These infarcts are associated with
serpiginous peripheral T2 hyperintensity and appear subacute. There
is no resulting subchondral collapse. More proximally in the distal
femoral metaphysis, there is an intraosseous lesion with
intermediate T1 and T2 signal, measuring up to 2.4 cm on image [DATE],
possibly an intraosseous lipoma. This appears nonaggressive. The
small focus of periosteal thickening involving the metadiaphysis of
the distal femur medially appears benign.

Other: Mild generalized subcutaneous edema surrounding the knee. No
focal fluid collection.
IMPRESSION: 1. Multiple medullary infarcts within both femoral condyles and the
tibial metaphysis as described. These appear subacute and could
contribute to knee pain. No resulting subchondral collapse.
2. The menisci, cruciate and collateral ligaments are intact.
3. Mild generalized subcutaneous edema surrounding the knee.

## 2022-05-31 ENCOUNTER — Other Ambulatory Visit: Payer: Self-pay | Admitting: Nurse Practitioner

## 2022-06-01 NOTE — Telephone Encounter (Signed)
Please advise KH 

## 2022-06-02 ENCOUNTER — Telehealth: Payer: Self-pay | Admitting: Nurse Practitioner

## 2022-06-02 NOTE — Telephone Encounter (Signed)
Called patient to schedule Medicare Annual Wellness Visit (AWV). Left message for patient to call back and schedule Medicare Annual Wellness Visit (AWV).  Last date of AWV: due 04/26/2022 per snapshot  Please schedule an appointment at any time with Abby, NHA. .  If any questions, please contact me at [phone].  Thank you,  Judeth Cornfield,  AMB Clinical Support [facility] AWV Program Direct Dial ??[phone]

## 2022-06-03 ENCOUNTER — Telehealth: Payer: Self-pay | Admitting: Nurse Practitioner

## 2022-06-03 NOTE — Telephone Encounter (Signed)
Contacted Tina Watkins to schedule their annual wellness visit. Appointment made for 06/25/2022.  Thank you,  Tina Watkins,  AMB Clinical Support [facility] AWV Program Direct Dial ??[phone]

## 2022-06-10 ENCOUNTER — Encounter (HOSPITAL_COMMUNITY): Payer: Self-pay | Admitting: Pharmacy Technician

## 2022-06-10 ENCOUNTER — Telehealth: Payer: Self-pay

## 2022-06-10 ENCOUNTER — Emergency Department (HOSPITAL_COMMUNITY)
Admission: EM | Admit: 2022-06-10 | Discharge: 2022-06-11 | Discharge status: Against Medical Advice | Payer: Medicare Other | Attending: Emergency Medicine | Admitting: Emergency Medicine

## 2022-06-10 ENCOUNTER — Other Ambulatory Visit: Payer: Self-pay

## 2022-06-10 ENCOUNTER — Emergency Department (HOSPITAL_COMMUNITY): Payer: Medicare Other

## 2022-06-10 DIAGNOSIS — R0602 Shortness of breath: Secondary | ICD-10-CM | POA: Insufficient documentation

## 2022-06-10 DIAGNOSIS — R202 Paresthesia of skin: Secondary | ICD-10-CM | POA: Diagnosis present

## 2022-06-10 DIAGNOSIS — Z5321 Procedure and treatment not carried out due to patient leaving prior to being seen by health care provider: Secondary | ICD-10-CM | POA: Insufficient documentation

## 2022-06-10 DIAGNOSIS — R0789 Other chest pain: Secondary | ICD-10-CM | POA: Diagnosis not present

## 2022-06-10 LAB — BASIC METABOLIC PANEL
Anion gap: 14 (ref 5–15)
BUN: 27 mg/dL — ABNORMAL HIGH (ref 6–20)
CO2: 23 mmol/L (ref 22–32)
Calcium: 9.8 mg/dL (ref 8.9–10.3)
Chloride: 95 mmol/L — ABNORMAL LOW (ref 98–111)
Creatinine, Ser: 5.85 mg/dL — ABNORMAL HIGH (ref 0.44–1.00)
GFR, Estimated: 8 mL/min — ABNORMAL LOW (ref >60)
Glucose, Bld: 89 mg/dL (ref 70–99)
Potassium: 4.1 mmol/L (ref 3.5–5.1)
Sodium: 132 mmol/L — ABNORMAL LOW (ref 135–145)

## 2022-06-10 LAB — CBC
HCT: 30.3 % — ABNORMAL LOW (ref 36.0–46.0)
Hemoglobin: 9.1 g/dL — ABNORMAL LOW (ref 12.0–15.0)
MCH: 28.2 pg (ref 26.0–34.0)
MCHC: 30 g/dL (ref 30.0–36.0)
MCV: 93.8 fL (ref 80.0–100.0)
Platelets: 194 10*3/uL (ref 150–400)
RBC: 3.23 MIL/uL — ABNORMAL LOW (ref 3.87–5.11)
RDW: 20.8 % — ABNORMAL HIGH (ref 11.5–15.5)
WBC: 5.5 10*3/uL (ref 4.0–10.5)
nRBC: 0 % (ref 0.0–0.2)

## 2022-06-10 LAB — TROPONIN I (HIGH SENSITIVITY): Troponin I (High Sensitivity): 18 ng/L — ABNORMAL HIGH (ref <18)

## 2022-06-10 NOTE — Telephone Encounter (Signed)
Pt called and advised office that she has been having some SOB and chest pressure. Pt also advised that she was having tingling down her right side of her arm. Pt advised to go to the ER as soon as she can.  JUST FYI  Renelda Loma RMA

## 2022-06-10 NOTE — ED Triage Notes (Addendum)
Pt here with reports of tingling to R arm shoulder down to her fingertips, shob and chest pressure. Pt spoke to PCP and was told to come here for evaluation. Hx afib. Last dialysis Tuesday. Symptoms started over a week ago.

## 2022-06-13 ENCOUNTER — Other Ambulatory Visit: Payer: Self-pay | Admitting: Nurse Practitioner

## 2022-06-15 IMAGING — DX DG CHEST 1V PORT
1 series · 1 of 1 positions shown · non-contrast
Comparison: December 12, 2019

CLINICAL DATA: Shortness of breath with fever and tachycardia

EXAM:
PORTABLE CHEST 1 VIEW

[chest ap]
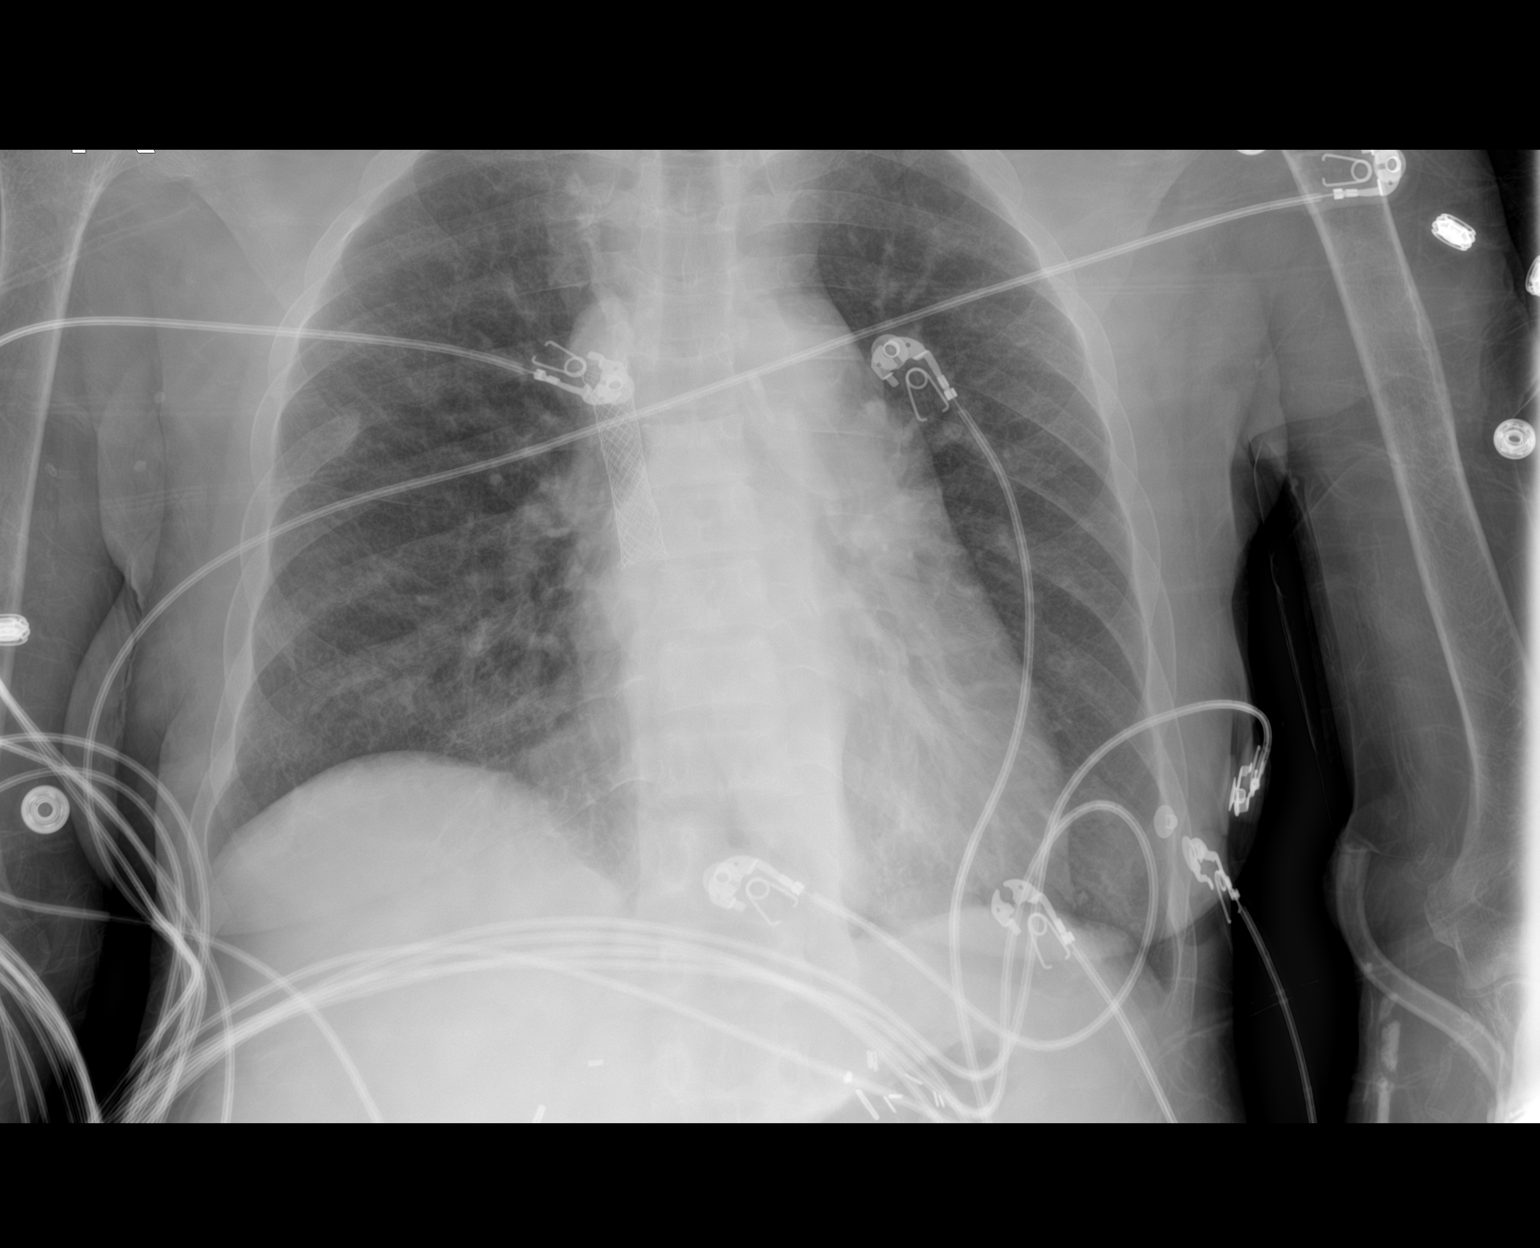

[1 of 1 positions shown; findings below may reference images not displayed]

FINDINGS: Lungs clear. Heart size within normal limits. Superior vena cava
stent unchanged in position. No evident adenopathy. Old healed
fracture of the right posterior sixth rib. Surgical clips noted in
upper abdomen.
IMPRESSION: Lungs clear. Stable cardiac silhouette. Stent in superior vena cava
unchanged in position.

## 2022-06-15 NOTE — Transitions of Care (Post Inpatient/ED Visit) (Cosign Needed)
06/15/2022  Name: Tina Watkins MRN: [id number] DOB: Feb 21, 1965  Today's TOC FU Call Status:    Transition Care Management Follow-up Telephone Call Date of Discharge: 06/11/22 Discharge Facility: [facility]) Type of Discharge: Emergency Department Reason for ED Visit: Respiratory, Cardiac Conditions Cardiac Conditions Diagnosis: Chest Pain Persisting Any questions or concerns?: No  Items Reviewed: Did you receive and understand the discharge instructions provided?: No Medications obtained,verified, and reconciled?: Yes (Medications Reviewed) Any new allergies since your discharge?: No Dietary orders reviewed?: NA Do you have support at home?: Yes People in Home: friend(s)  Medications Reviewed Today: Medications Reviewed Today     Reviewed by Renelda Loma, RMA (Registered Medical Assistant) on 06/15/22 at [phone]  Med List Status: <None>   Medication Order Taking? Sig Documenting Provider Last Dose Status Informant  allopurinol (ZYLOPRIM) 100 MG tablet [id number] Yes TAKE 1 TABLET BY MOUTH EVERY DAY Barbette Merino, NP Taking Active Self  amiodarone (PACERONE) 200 MG tablet [id number] Yes TAKE 1 TABLET BY MOUTH EVERY DAY Bensimhon, Bevelyn Buckles, MD Taking Active   busPIRone (BUSPAR) 10 MG tablet [id number] No Take 5 mg by mouth 2 (two) times daily.  Patient not taking: Reported on 01/14/2022   [provider] Not Taking Active Self  Colchicine 0.6 MG CAPS [id number] Yes Take 0.6 mg by mouth daily as needed (gout flare up). Ivonne Andrew, NP Taking Active   cyclobenzaprine (FLEXERIL) 5 MG tablet [id number] No Take 1 tablet (5 mg total) by mouth at bedtime as needed.  Patient not taking: Reported on 06/15/2022   Fuller Plan, MD Not Taking Active   diclofenac Sodium (VOLTAREN) 1 % GEL [id number] Yes Apply 4 g topically as needed. [provider] Taking Active   diphenhydrAMINE (BENADRYL) 25 mg capsule [id number] Yes Take 1 capsule (25 mg total) by mouth  every 8 (eight) hours as needed. Kallie Locks, FNP Taking Active Self  dorzolamide-timolol (COSOPT) 2-0.5 % ophthalmic solution [id number] Yes Place 1 drop into the left eye 2 (two) times daily. [provider] Taking Active   ELIQUIS 2.5 MG TABS tablet [id number] Yes TAKE 1 TABLET BY MOUTH TWICE A DAY Bensimhon, Bevelyn Buckles, MD Taking Active   escitalopram (LEXAPRO) 20 MG tablet [id number] Yes Take 20 mg by mouth daily. [provider] Taking Active Self  famotidine (PEPCID) 20 MG tablet [id number] Yes TAKE 1 TABLET BY MOUTH TWICE A DAY Ivonne Andrew, NP Taking Active   fluticasone (FLONASE) 50 MCG/ACT nasal spray [id number] No Place 1 spray into both nostrils daily as needed for allergies or rhinitis.  Patient not taking: Reported on 03/09/2022   [provider] Not Taking Active Self  fluticasone-salmeterol (ADVAIR [facility]) 115-21 MCG/ACT inhaler [id number] Yes Inhale 2 puffs into the lungs 2 (two) times daily. Martina Sinner, MD Taking Active Self  gabapentin (NEURONTIN) 100 MG capsule [id number] Yes TAKE 1 CAPSULE BY MOUTH TWICE A DAY MAX 2 CAPSULES DAILY  Patient taking differently: Take 100 mg by mouth 3 (three) times daily.   Ivonne Andrew, NP Taking Active   lidocaine (LIDODERM) 5 % [id number] Yes Place 1 patch onto the skin daily as needed (for pain- remove old patch first).  [provider] Taking Active Self  metoCLOPramide (REGLAN) 5 MG tablet [id number] Yes Take 1 tablet (5 mg total) by mouth 2 (two) times daily before a meal. Ivonne Andrew, NP Taking Active   midodrine (PROAMATINE) 10 MG tablet [id number] Yes Take 1.5 tablets (15 mg  total) by mouth 3 (three) times daily. NEEDS FOLLOW UP APPOINTMENT FOR ANYMORE REFILLS [place], FNP Taking Active   omeprazole (PRILOSEC) 40 MG capsule [id number] Yes TAKE 1 CAPSULE (40 MG TOTAL) BY MOUTH DAILY. Napoleon Form, MD Taking Active   ondansetron (ZOFRAN-ODT) 4 MG disintegrating tablet [id number]  Yes Take 1 tablet (4 mg total) by mouth every 8 (eight) hours as needed for nausea or vomiting. Barbette Merino, NP Taking Active Self  ondansetron (ZOFRAN-ODT) 8 MG disintegrating tablet [id number] Yes TAKE 1 TABLET (8 MG TOTAL) BY MOUTH 2 (TWO) TIMES DAILY FOR 15 DAYS. Ivonne Andrew, NP Taking Active   oxyCODONE-acetaminophen (PERCOCET) 10-325 MG tablet [id number] Yes Take 1 tablet by mouth 4 (four) times daily as needed for pain. [provider] Taking Active Self           Med Note Lisbeth Renshaw, AMANDA L   Wed Apr 03, 2020  9:10 AM)    Vitamin D, Ergocalciferol, (DRISDOL) 1.25 MG (50000 UNIT) CAPS capsule [id number] Yes TAKE 1 CAPSULE (50,000 UNITS TOTAL) BY MOUTH EVERY 7 (SEVEN) DAYS Barbette Merino, NP Taking Active Self  zolpidem (AMBIEN) 10 MG tablet [id number] Yes Take 1 tablet (10 mg total) by mouth at bedtime as needed for sleep. Maurilio Lovely D, DO Taking Active Self            Home Care and Equipment/Supplies: Were Home Health Services Ordered?: NA Any new equipment or medical supplies ordered?: NA  Functional Questionnaire: Do you need assistance with bathing/showering or dressing?: No Do you need assistance with meal preparation?: No Do you need assistance with eating?: No Do you have difficulty maintaining continence: No Do you need assistance with getting out of bed/getting out of a chair/moving?: No Do you have difficulty managing or taking your medications?: No  Follow up appointments reviewed: PCP Follow-up appointment confirmed?: Yes Date of PCP follow-up appointment?: 06/22/22 Follow-up Provider: Nicola Girt Follow-up appointment confirmed?: NA Do you need transportation to your follow-up appointment?: No Do you understand care options if your condition(s) worsen?: Yes-patient verbalized understanding    SIGNATURE Renelda Loma RMA

## 2022-06-22 ENCOUNTER — Ambulatory Visit (INDEPENDENT_AMBULATORY_CARE_PROVIDER_SITE_OTHER): Payer: Medicare Other | Admitting: Nurse Practitioner

## 2022-06-22 ENCOUNTER — Encounter: Payer: Self-pay | Admitting: Nurse Practitioner

## 2022-06-22 VITALS — BP 88/44 | HR 58 | Temp 97.2°F | Wt 115.8 lb

## 2022-06-22 DIAGNOSIS — N185 Chronic kidney disease, stage 5: Secondary | ICD-10-CM | POA: Diagnosis not present

## 2022-06-22 DIAGNOSIS — M25551 Pain in right hip: Secondary | ICD-10-CM | POA: Diagnosis not present

## 2022-06-22 DIAGNOSIS — D631 Anemia in chronic kidney disease: Secondary | ICD-10-CM | POA: Diagnosis not present

## 2022-06-22 DIAGNOSIS — M25552 Pain in left hip: Secondary | ICD-10-CM

## 2022-06-22 MED ORDER — DICLOFENAC SODIUM 1 % EX GEL: 4.0000 g | CUTANEOUS | 0 refills | Status: DC | PRN

## 2022-06-22 NOTE — Progress Notes (Signed)
@Patient  ID: Tina Watkins, female    DOB: [birth year], 57 y.o.   MRN: [id number]  Chief Complaint  Patient presents with   Hospitalization Follow-up   Shoulder Pain    Right     Referring provider: Ivonne Andrew, NP   HPI  Tina Watkins 57 y.o. female  has a past medical history of Bacteremia due to Gram-negative bacteria (05/23/2011), Blind, Blind right eye, CHF (congestive heart failure) (HCC), Chronic lower back pain, Complication of anesthesia, DDD (degenerative disc disease), cervical, Depression, Dysrhythmia, E coli bacteremia (06/18/2011), Elevated LDL cholesterol level (12/2018), ESRD (end stage renal disease) (HCC) (06/12/2011), Fibromyalgia, Gastroparesis, Gastroparesis, GERD (gastroesophageal reflux disease), Glaucoma, Gout, H/O carpal tunnel syndrome, Headache (10/2019), Headache(784.0), Herpes genitalia (1994), History of blood transfusion, History of stomach ulcers, Hypotension, Iron deficiency anemia, New onset a-fib (HCC), Osteopenia, Peripheral neuropathy (11/2018), Pressure ulcer of sacral region, stage 1 (07/2019), Seizures (HCC) (1994), Spinal stenosis in cervical region, Stroke [facility]), Stroke (HCC) (~ 1999; 2001), and Vitamin D deficiency (12/2018).   Patient presents today for an ED follow-up.  Patient went to the ED on 06/10/2022 for chest pressure and shortness of breath.  She was triaged but left before she was seen by provider.  She stated the ED wait was too long.  Patient does have an upcoming appointment already scheduled with cardiology and an upcoming appointment for the A-fib clinic.  Patient states that she is no longer having the chest pain at this time.  She complains today of right shoulder pain.  She is being seen by the pain clinic with [facility] medical and by Ortho.  We discussed that she needs to follow-up with them for her shoulder pain.  She has history of right frozen shoulder.  We discussed that she can try her gel and she can try her lidocaine  patches on her shoulder.  She states that she will try this today.  Patient declines labs today.  She states that she is due for dialysis tomorrow and they will be getting labs there. Denies f/c/s, n/v/d, hemoptysis, PND, leg swelling Denies chest pain or edema    Allergies  Allergen Reactions   Levofloxacin Itching and Rash   Tape Other (See Comments)    "Certain surgical tapes peel off my skin"    Immunization History  Administered Date(s) Administered   Hepatitis B, PED/ADOLESCENT 07/20/2019, 08/29/2019, 09/21/2019   Hepb-cpg 07/20/2019, 08/29/2019, 09/21/2019, 11/23/2019   Influenza Split 11/07/2019   Influenza, Quadrivalent, Recombinant, Inj, Pf 11/11/2021   Influenza,inj,Quad PF,6+ Mos 11/07/2019, 11/07/2020   PFIZER Comirnaty(Gray Top)Covid-19 Tri-Sucrose Vaccine 07/29/2020   PFIZER(Purple Top)SARS-COV-2 Vaccination 05/31/2019, 06/21/2019, 10/26/2019   PNEUMOCOCCAL CONJUGATE-20 06/04/2022   PPD Test 08/10/2017   Pfizer Covid-19 Vaccine Bivalent Booster 90yrs & up 02/06/2021    Past Medical History:  Diagnosis Date   Bacteremia due to Gram-negative bacteria 05/23/2011   Blind    right eye   Blind right eye    CHF (congestive heart failure) (HCC)    Chronic lower back pain    Complication of anesthesia    "woke up during OR; I have an extremely high tolerance" (12/11/2011) 1 procedure was graft; the other procedures were procedures that are typically done with sedation.   DDD (degenerative disc disease), cervical    Depression    Dysrhythmia    "tachycardia" (12/11/2011) new onset afib 10/15/14 EKG   E coli bacteremia 06/18/2011   Elevated LDL cholesterol level 12/2018   ESRD (end stage renal disease) (HCC) 06/12/2011  Fibromyalgia    Gastroparesis    Gastroparesis    GERD (gastroesophageal reflux disease)    Glaucoma    right eye   Gout    H/O carpal tunnel syndrome    Headache 10/2019   Headache(784.0)    "not often anymore" (12/11/2011)   Herpes genitalia 1994    History of blood transfusion    "more than a few times" (12/11/2011)   History of stomach ulcers    Hypotension    Iron deficiency anemia    New onset a-fib (HCC)    10/15/14 EKG   Osteopenia    Peripheral neuropathy 11/2018   Pressure ulcer of sacral region, stage 1 07/2019   Seizures (HCC) 1994   "post transplant; only have had that one" (12/11/2011)   Spinal stenosis in cervical region    Stroke [facility])     left basal ganglia lacunar infarct; Right frontal lobe lacunar infarct.   Stroke [facility]) ~ 1999; 2001   "briefly lost my vision; lost my right eye" (12/11/2011)   Vitamin D deficiency 12/2018    Tobacco History: Social History   Tobacco Use  Smoking Status Every Day   Packs/day: 1.50   Years: 35.00   Additional pack years: 0.00   Total pack years: 52.50   Types: Cigarettes   Passive exposure: Never  Smokeless Tobacco Never  Tobacco Comments   Pack of cigarettes lasts 2 days 09/18/2020   Ready to quit: Not Answered Counseling given: Not Answered Tobacco comments: Pack of cigarettes lasts 2 days 09/18/2020   Outpatient Encounter Medications as of 06/22/2022  Medication Sig   allopurinol (ZYLOPRIM) 100 MG tablet TAKE 1 TABLET BY MOUTH EVERY DAY   amiodarone (PACERONE) 200 MG tablet TAKE 1 TABLET BY MOUTH EVERY DAY   Colchicine 0.6 MG CAPS Take 0.6 mg by mouth daily as needed (gout flare up).   diphenhydrAMINE (BENADRYL) 25 mg capsule Take 1 capsule (25 mg total) by mouth every 8 (eight) hours as needed.   dorzolamide-timolol (COSOPT) 2-0.5 % ophthalmic solution Place 1 drop into the left eye 2 (two) times daily.   ELIQUIS 2.5 MG TABS tablet TAKE 1 TABLET BY MOUTH TWICE A DAY   escitalopram (LEXAPRO) 20 MG tablet Take 20 mg by mouth daily.   famotidine (PEPCID) 20 MG tablet TAKE 1 TABLET BY MOUTH TWICE A DAY   fluticasone-salmeterol (ADVAIR HFA) 115-21 MCG/ACT inhaler Inhale 2 puffs into the lungs 2 (two) times daily.   gabapentin (NEURONTIN) 100 MG capsule TAKE 1  CAPSULE BY MOUTH TWICE A DAY MAX 2 CAPSULES DAILY (Patient taking differently: Take 100 mg by mouth 3 (three) times daily.)   lidocaine (LIDODERM) 5 % Place 1 patch onto the skin daily as needed (for pain- remove old patch first).    metoCLOPramide (REGLAN) 5 MG tablet Take 1 tablet (5 mg total) by mouth 2 (two) times daily before a meal.   midodrine (PROAMATINE) 10 MG tablet Take 1.5 tablets (15 mg total) by mouth 3 (three) times daily. NEEDS FOLLOW UP APPOINTMENT FOR ANYMORE REFILLS   omeprazole (PRILOSEC) 40 MG capsule TAKE 1 CAPSULE (40 MG TOTAL) BY MOUTH DAILY.   ondansetron (ZOFRAN-ODT) 4 MG disintegrating tablet Take 1 tablet (4 mg total) by mouth every 8 (eight) hours as needed for nausea or vomiting.   oxyCODONE-acetaminophen (PERCOCET) 10-325 MG tablet Take 1 tablet by mouth 4 (four) times daily as needed for pain.   Vitamin D, Ergocalciferol, (DRISDOL) 1.25 MG (50000 UNIT) CAPS capsule TAKE 1 CAPSULE (  50,000 UNITS TOTAL) BY MOUTH EVERY 7 (SEVEN) DAYS   zolpidem (AMBIEN) 10 MG tablet Take 1 tablet (10 mg total) by mouth at bedtime as needed for sleep.   [DISCONTINUED] diclofenac Sodium (VOLTAREN) 1 % GEL Apply 4 g topically as needed.   busPIRone (BUSPAR) 10 MG tablet Take 5 mg by mouth 2 (two) times daily. (Patient not taking: Reported on 01/14/2022)   cyclobenzaprine (FLEXERIL) 5 MG tablet Take 1 tablet (5 mg total) by mouth at bedtime as needed. (Patient not taking: Reported on 06/15/2022)   diclofenac Sodium (VOLTAREN) 1 % GEL Apply 4 g topically as needed.   fluticasone (FLONASE) 50 MCG/ACT nasal spray Place 1 spray into both nostrils daily as needed for allergies or rhinitis. (Patient not taking: Reported on 03/09/2022)   No facility-administered encounter medications on file as of 06/22/2022.     Review of Systems  Review of Systems  Constitutional: Negative.   HENT: Negative.    Cardiovascular: Negative.   Gastrointestinal: Negative.   Musculoskeletal:        Chronic right  shoulder pain  Allergic/Immunologic: Negative.   Neurological: Negative.   Psychiatric/Behavioral: Negative.         Physical Exam  BP (!) 88/44   Pulse (!) 58   Temp (!) 97.2 F (36.2 C)   Wt 115 lb 12.8 oz (52.5 kg)   SpO2 100%   BMI 23.39 kg/m   Wt Readings from Last 5 Encounters:  06/22/22 115 lb 12.8 oz (52.5 kg)  03/09/22 117 lb (53.1 kg)  01/14/22 116 lb 3.2 oz (52.7 kg)  01/12/22 116 lb (52.6 kg)  12/15/21 115 lb (52.2 kg)     Physical Exam Vitals and nursing note reviewed.  Constitutional:      General: She is not in acute distress.    Appearance: She is well-developed.  Cardiovascular:     Rate and Rhythm: Normal rate and regular rhythm.  Pulmonary:     Effort: Pulmonary effort is normal.     Breath sounds: Normal breath sounds.  Neurological:     Mental Status: She is alert and oriented to person, place, and time.      Lab Results:  CBC    Component Value Date/Time   WBC 5.5 06/10/2022 1805   RBC 3.23 (L) 06/10/2022 1805   HGB 9.1 (L) 06/10/2022 1805   HGB 10.1 (L) 10/13/2019 0916   HGB 6.7 (LL) 05/08/2019 0854   HCT 30.3 (L) 06/10/2022 1805   HCT 25.7 (L) 07/08/2019 1446   PLT 194 06/10/2022 1805   PLT 170 10/13/2019 0916   PLT 170 05/08/2019 0854   MCV 93.8 06/10/2022 1805   MCV 98 (H) 05/08/2019 0854   MCH 28.2 06/10/2022 1805   MCHC 30.0 06/10/2022 1805   RDW 20.8 (H) 06/10/2022 1805   RDW 21.9 (H) 05/08/2019 0854   LYMPHSABS 0.9 03/30/2021 0915   MONOABS 0.3 03/30/2021 0915   EOSABS 0.1 03/30/2021 0915   BASOSABS 0.0 03/30/2021 0915    BMET    Component Value Date/Time   NA 132 (L) 06/10/2022 1805   NA 138 05/08/2019 0854   K 4.1 06/10/2022 1805   CL 95 (L) 06/10/2022 1805   CO2 23 06/10/2022 1805   GLUCOSE 89 06/10/2022 1805   BUN 27 (H) 06/10/2022 1805   BUN 82 (HH) 05/08/2019 0854   CREATININE 5.85 (H) 06/10/2022 1805   CREATININE 4.30 (HH) 10/13/2019 0916   CALCIUM 9.8 06/10/2022 1805   CALCIUM 9.0 04/10/2019  1610   GFRNONAA 8 (L) 06/10/2022 1805   GFRNONAA 11 (L) 10/13/2019 0916   GFRAA 13 (L) 10/13/2019 0916    BNP    Component Value Date/Time   BNP 474.1 (H) 07/07/2019 1033    ProBNP No results found for: "PROBNP"  Imaging: DG Chest 2 View  Result Date: 06/10/2022 CLINICAL DATA:  Chest pain. EXAM: CHEST - 2 VIEW COMPARISON:  CT chest 09/24/2021 and radiographs 09/06/2021 FINDINGS: Stent in the expected location of the SVC. Stable cardiomediastinal silhouette. Aortic atherosclerotic calcification. Pulmonary vascular congestion and chronic interstitial coarsening. Unchanged blunting of the right costophrenic angle which may represent a trace pleural effusion or pleural thickening. No focal consolidation. No pneumothorax. No displaced rib fractures. Cervical spine fusion hardware. IMPRESSION: No change from 09/06/2021.  Chronic interstitial coarsening. Electronically Signed   By: Minerva Fester M.D.   On: 06/10/2022 18:40     Assessment & Plan:   Bilateral hip pain - diclofenac Sodium (VOLTAREN) 1 % GEL; Apply 4 g topically as needed.  Dispense: 50 g; Refill: 0  Chest pain:  Please keep appointments with a-fib clinic and cardiology  Follow up:  Follow up in 3 months     Ivonne Andrew, NP 06/22/2022

## 2022-06-22 NOTE — Patient Instructions (Addendum)
1. Bilateral hip pain  - diclofenac Sodium (VOLTAREN) 1 % GEL; Apply 4 g topically as needed.  Dispense: 50 g; Refill: 0  Chest pain:  Please keep appointments with a-fib clinic and cardiology  Follow up:  Follow up in 3 months

## 2022-06-22 NOTE — Assessment & Plan Note (Signed)
-   diclofenac Sodium (VOLTAREN) 1 % GEL; Apply 4 g topically as needed.  Dispense: 50 g; Refill: 0  Chest pain:  Please keep appointments with a-fib clinic and cardiology  Follow up:  Follow up in 3 months

## 2022-06-25 ENCOUNTER — Ambulatory Visit (INDEPENDENT_AMBULATORY_CARE_PROVIDER_SITE_OTHER): Payer: Medicare Other

## 2022-06-25 VITALS — BP 105/63 | Ht 59.0 in | Wt 114.0 lb

## 2022-06-25 DIAGNOSIS — Z0000 Encounter for general adult medical examination without abnormal findings: Secondary | ICD-10-CM

## 2022-06-25 DIAGNOSIS — Z1231 Encounter for screening mammogram for malignant neoplasm of breast: Secondary | ICD-10-CM

## 2022-06-25 DIAGNOSIS — Z780 Asymptomatic menopausal state: Secondary | ICD-10-CM

## 2022-06-25 NOTE — Patient Instructions (Signed)
Tina Watkins , Thank you for taking time to come for your Medicare Wellness Visit. I appreciate your ongoing commitment to your health goals. Please review the following plan we discussed and let me know if I can assist you in the future.   FAX NUMBER TO FAX YOUR HEP C SCREEN RESULTS: [phone]  These are the goals we discussed:  Goals       "I want an aide to come to the home to help me a little bit with some light housework and meal preparation." (pt-stated)      CARE PLAN ENTRY Medicaid Managed Care (see longitudinal plan of care for additional care plan information)  Current Barriers:  Over the next 60 days, patient will work with CM team to address in-home personal care needs. Patient requests additional assistance with ADL/IADL. Home health PT consult pending.Update 12/22/19-patient states she does not need HHPT services right now. Update 01/29/2020:  Patient stated she received a call on 01/25/2020 from home health agency that Medicaid will no longer pay for an aide to work with her. Patient stated she does not know the reason this happened, but she will contact DSS Medicaid caseworker and her PCP for more information.  Clinical Social Work Clinical Goal(s):  Over the next 30 days, patient will work with CM team to address needs related to in-home personal care needs.  Patient discharged from rehab in late August and is currently residing at home alone and receives no services. LCSW will collaborate with PCP to address her needs. Update 01/29/2020: LCSW will collaborate with PCP for additional information regarding termination of the aide's services.  Interventions: Inter-disciplinary care team collaboration (see longitudinal plan of care) Patient interviewed and appropriate assessments performed Provided education to patient/caregiver regarding level of care options. Patient may be eligible for [facility] services; will collaborate with home health PT regarding personal care needs.  Update:   RNCM spoke to patient regarding PCS and she has not received any updates to date-will follow up with PCP. Update 11/22/2019: LCSW will collaborate with BSW and PCP regarding PCS services. BSW contacted [employer] at [phone] for [employer] services rep stated that PCP has to complete referral form. Update 12/20/19 Patient states that she is now getting PCS services MWF. Update 12/22/19-Patient receiving house keeping and food preparation services-she is very pleased with these services 01/24/20-[facility] providing services. Update 01/29/2020: Patient stated that on 01/25/2020, she received a call from the home health agency that Medicaid would no longer pay for an aide and the services were terminated. LCSW will collaborate with PCP regarding termination. Patient stated she will contact her DSS Medicaid worker and also her PCP for additional information and assistance with getting her aide to start back working with her. Update 02/21/20:  PCS services have not restarted to date.  RNCM will follow up with PCP.  Patient Self Care Activities:  Patient will work with CM team to address personal care needs. Licensed Clinical Social Worker will collaborate with MM care team and PCP office to address personal care needs. Update 01/29/2020: LCSW will follow up on 02/16/2020 @ 11:00am.        outpatient therapy to help with depression symptoms      CARE PLAN ENTRY Medicaid Managed Care (see longitudinal plan of care for additional care plan information)  Current Barriers:  Community resources access barrier: Patient Lacks knowledge of community resource: mental health outpatient therapy providers. Patient has specific requests for a provider: older, female, have some basis with  Christianity, and preferably in-person therapy sessions. Patient was initially reluctant because she did not understand how therapy could help. However, after discussion, she agreed to try therapy. Patient needs  assistance with appointment scheduling and follow up with community [facility] [phone]. Update 01/29/2020: Patient stated she has not received communication from [facility] regarding rescheduling an appointment.  Limited social support Medical/physical health concerns. 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  Clinical Social Work Clinical Goal(s):  Over the next 60 days, patient will work with SW to address concerns related to depression and scheduling appointment with outpatient therapy provider. Over the next 30-60 days, patient will work with BSW to address needs related to scheduling appointment with an outpatient mental health therapy provider.  Interventions: Inter-disciplinary care team collaboration (see longitudinal plan of care) Patient interviewed and appropriate assessments performed Referred patient to community resources care guide team for assistance with scheduling appointment for mental health therapy. Provided mental health counseling with regard to depression, being alone and the importance of a support system. Discussed plans with patient for ongoing care management follow up and provided patient with direct contact information for care management team Collaborated with BSW re: outpatient therapy needs BSW contacted [facility] for outpatient 12/11/19 at 10:20 am for an assessment. Update 12/20/19 Patient states she missed her appointment due to being sick, however they have called her and left a message to reschedule.  Update 12/22/19:  Patient states she is waiting on a return call from [facility] to reschedule appointment.  Encouraged patient to call and reschedule if she does not hear anything from them-verified phone number with patient. Update 12/25/2019:  Patient has appointment for 12/27/2019 with [facility]. Update 01/29/2020: LCSW will collaborate  with BSW regarding status of appointment with [facility]. If office is unable to schedule patient, then another agency will be contacted.  Patient Self Care Activities:  Patient will collaborate with the Managed [facility] Care Team Patient will attend all scheduled appointments. Licensed Clinical Social Worker will refer patient to BSW for assistance scheduling appointment at [facility] at [phone]. Licensed Clinical Social Worker will follow-up on December 25, 2019 @ 11:00am. Update 12/25/2019: Licensed Clinical Social Worker will follow up on January 24, 2020 @ 11:00am. Update 01/29/2020: LCSW will follow up on February 16, 2020 @ 11:00am.  Please see past updates related to this goal by clicking on the "Past Updates" button in the selected goal       Patient Stated      Patient states her goal is to be able to put her hair in a ponytail        This is a list of the screening recommended for you and due dates:  Health Maintenance  Topic Date Due   DTaP/Tdap/Td vaccine (1 - Tdap) Never done   Zoster (Shingles) Vaccine (1 of 2) Never done   Pap Smear  Never done   COVID-19 Vaccine (6 - 2023-24 season) 10/03/2021   Medicare Annual Wellness Visit  04/25/2022   Flu Shot  09/03/2022   Screening for Lung Cancer  09/25/2022   Mammogram  05/24/2023   Colon Cancer Screening  09/13/2030   Hepatitis C Screening  Completed   HIV Screening  Completed   HPV Vaccine  Aged Out    Advanced directives: Advance directive discussed with you today. Even though you declined this today, please call our office should you change  your mind, and we can give you the proper paperwork for you to fill out. Advance care planning is a way to make decisions about medical care that fits your values in case you are ever unable to make these decisions for yourself.   Conditions/risks identified: REFERRALS PLACED TO UPDATE YOUR MAMMOGRAM AND LUNG CANCER SCREEN   Aim for 30 minutes of  exercise or brisk walking, 6-8 glasses of water, and 5 servings of fruits and vegetables each day. If you wish to quit smoking, help is available. For free tobacco cessation program offerings call the [facility] at [phone] or Live Well Line at (587)877-8081. You may also visit www.[place].com or email livelifewell@[place] .com for more information on other programs.   You may also call 1-800-QUIT-NOW (425-652-2279) or visit www.[place].ch or www.[facility].org for additional resources on smoking cessation.    Next appointment: Follow up in one year for your annual wellness visit. Jul 01, 2022 at 2pm via telephone  Preventive Care 40-64 Years, Female Preventive care refers to lifestyle choices and visits with your health care provider that can promote health and wellness. What does preventive care include? A yearly physical exam. This is also called an annual well check. Dental exams once or twice a year. Routine eye exams. Ask your health care provider how often you should have your eyes checked. Personal lifestyle choices, including: Daily care of your teeth and gums. Regular physical activity. Eating a healthy diet. Avoiding tobacco and drug use. Limiting alcohol use. Practicing safe sex. Taking low-dose aspirin daily starting at age 11. Taking vitamin and mineral supplements as recommended by your health care provider. What happens during an annual well check? The services and screenings done by your health care provider during your annual well check will depend on your age, overall health, lifestyle risk factors, and family history of disease. Counseling  Your health care provider may ask you questions about your: Alcohol use. Tobacco use. Drug use. Emotional well-being. Home and relationship well-being. Sexual activity. Eating habits. Work and work Astronomer. Method of birth control. Menstrual cycle. Pregnancy history. Screening  You may  have the following tests or measurements: Height, weight, and BMI. Blood pressure. Lipid and cholesterol levels. These may be checked every 5 years, or more frequently if you are over 38 years old. Skin check. Lung cancer screening. You may have this screening every year starting at age 29 if you have a 30-pack-year history of smoking and currently smoke or have quit within the past 15 years. Fecal occult blood test (FOBT) of the stool. You may have this test every year starting at age 67. Flexible sigmoidoscopy or colonoscopy. You may have a sigmoidoscopy every 5 years or a colonoscopy every 10 years starting at age 74. Hepatitis C blood test. Hepatitis B blood test. Sexually transmitted disease (STD) testing. Diabetes screening. This is done by checking your blood sugar (glucose) after you have not eaten for a while (fasting). You may have this done every 1-3 years. Mammogram. This may be done every 1-2 years. Talk to your health care provider about when you should start having regular mammograms. This may depend on whether you have a family history of breast cancer. BRCA-related cancer screening. This may be done if you have a family history of breast, ovarian, tubal, or peritoneal cancers. Pelvic exam and Pap test. This may be done every 3 years starting at age 10. Starting at age 90, this may be done every 5 years if you have a  Pap test in combination with an HPV test. Bone density scan. This is done to screen for osteoporosis. You may have this scan if you are at high risk for osteoporosis. Discuss your test results, treatment options, and if necessary, the need for more tests with your health care provider. Vaccines  Your health care provider may recommend certain vaccines, such as: Influenza vaccine. This is recommended every year. Tetanus, diphtheria, and acellular pertussis (Tdap, Td) vaccine. You may need a Td booster every 10 years. Zoster vaccine. You may need this after age  60. Pneumococcal 13-valent conjugate (PCV13) vaccine. You may need this if you have certain conditions and were not previously vaccinated. Pneumococcal polysaccharide (PPSV23) vaccine. You may need one or two doses if you smoke cigarettes or if you have certain conditions. Talk to your health care provider about which screenings and vaccines you need and how often you need them. This information is not intended to replace advice given to you by your health care provider. Make sure you discuss any questions you have with your health care provider. Document Released: 02/15/2015 Document Revised: 10/09/2015 Document Reviewed: 11/20/2014 Elsevier Interactive Patient Education  2017 [employer].    Fall Prevention in the Home Falls can cause injuries. They can happen to people of all ages. There are many things you can do to make your home safe and to help prevent falls. What can I do on the outside of my home? Regularly fix the edges of walkways and driveways and fix any cracks. Remove anything that might make you trip as you walk through a door, such as a raised step or threshold. Trim any bushes or trees on the path to your home. Use bright outdoor lighting. Clear any walking paths of anything that might make someone trip, such as rocks or tools. Regularly check to see if handrails are loose or broken. Make sure that both sides of any steps have handrails. Any raised decks and porches should have guardrails on the edges. Have any leaves, snow, or ice cleared regularly. Use sand or salt on walking paths during winter. Clean up any spills in your garage right away. This includes oil or grease spills. What can I do in the bathroom? Use night lights. Install grab bars by the toilet and in the tub and shower. Do not use towel bars as grab bars. Use non-skid mats or decals in the tub or shower. If you need to sit down in the shower, use a plastic, non-slip stool. Keep the floor dry. Clean up  any water that spills on the floor as soon as it happens. Remove soap buildup in the tub or shower regularly. Attach bath mats securely with double-sided non-slip rug tape. Do not have throw rugs and other things on the floor that can make you trip. What can I do in the bedroom? Use night lights. Make sure that you have a light by your bed that is easy to reach. Do not use any sheets or blankets that are too big for your bed. They should not hang down onto the floor. Have a firm chair that has side arms. You can use this for support while you get dressed. Do not have throw rugs and other things on the floor that can make you trip. What can I do in the kitchen? Clean up any spills right away. Avoid walking on wet floors. Keep items that you use a lot in easy-to-reach places. If you need to reach something above you, use a  strong step stool that has a grab bar. Keep electrical cords out of the way. Do not use floor polish or wax that makes floors slippery. If you must use wax, use non-skid floor wax. Do not have throw rugs and other things on the floor that can make you trip. What can I do with my stairs? Do not leave any items on the stairs. Make sure that there are handrails on both sides of the stairs and use them. Fix handrails that are broken or loose. Make sure that handrails are as long as the stairways. Check any carpeting to make sure that it is firmly attached to the stairs. Fix any carpet that is loose or worn. Avoid having throw rugs at the top or bottom of the stairs. If you do have throw rugs, attach them to the floor with carpet tape. Make sure that you have a light switch at the top of the stairs and the bottom of the stairs. If you do not have them, ask someone to add them for you. What else can I do to help prevent falls? Wear shoes that: Do not have high heels. Have rubber bottoms. Are comfortable and fit you well. Are closed at the toe. Do not wear sandals. If you use  a stepladder: Make sure that it is fully opened. Do not climb a closed stepladder. Make sure that both sides of the stepladder are locked into place. Ask someone to hold it for you, if possible. Clearly mark and make sure that you can see: Any grab bars or handrails. First and last steps. Where the edge of each step is. Use tools that help you move around (mobility aids) if they are needed. These include: Canes. Walkers. Scooters. Crutches. Turn on the lights when you go into a dark area. Replace any light bulbs as soon as they burn out. Set up your furniture so you have a clear path. Avoid moving your furniture around. If any of your floors are uneven, fix them. If there are any pets around you, be aware of where they are. Review your medicines with your doctor. Some medicines can make you feel dizzy. This can increase your chance of falling. Ask your doctor what other things that you can do to help prevent falls. This information is not intended to replace advice given to you by your health care provider. Make sure you discuss any questions you have with your health care provider. Document Released: 11/15/2008 Document Revised: 06/27/2015 Document Reviewed: 02/23/2014 Elsevier Interactive Patient Education  2017 [employer] Inc.   Managing Pain Without Opioids  Opioids are strong medicines used to treat moderate to severe pain. For some people, especially those who have long-term (chronic) pain, opioids may not be the best choice for pain management due to: Side effects like nausea, constipation, and sleepiness. The risk of addiction (opioid use disorder). The longer you take opioids, the greater your risk of addiction. Pain that lasts for more than 3 months is called chronic pain. Managing chronic pain usually requires more than one approach and is often provided by a team of health care providers working together (multidisciplinary approach). Pain management may be done at a pain  management center or pain clinic. How to manage pain without the use of opioids Use non-opioid medicines Non-opioid medicines for pain may include: Over-the-counter or prescription non-steroidal anti-inflammatory drugs (NSAIDs). These may be the first medicines used for pain. They work well for muscle and bone pain, and they reduce swelling. Acetaminophen. This over-the-counter  medicine may work well for milder pain but not swelling. Antidepressants. These may be used to treat chronic pain. A certain type of antidepressant (tricyclics) is often used. These medicines are given in lower doses for pain than when used for depression. Anticonvulsants. These are usually used to treat seizures but may also reduce nerve (neuropathic) pain. Muscle relaxants. These relieve pain caused by sudden muscle tightening (spasms). You may also use a pain medicine that is applied to the skin as a patch, cream, or gel (topical analgesic), such as a numbing medicine. These may cause fewer side effects than medicines taken by mouth. Do certain therapies as directed Some therapies can help with pain management. They include: Physical therapy. You will do exercises to gain strength and flexibility. A physical therapist may teach you exercises to move and stretch parts of your body that are weak, stiff, or painful. You can learn these exercises at physical therapy visits and practice them at home. Physical therapy may also involve: Massage. Heat wraps or applying heat or cold to affected areas. Electrical signals that interrupt pain signals (transcutaneous electrical nerve stimulation, TENS). Weak lasers that reduce pain and swelling (low-level laser therapy). Signals from your body that help you learn to regulate pain (biofeedback). Occupational therapy. This helps you to learn ways to function at home and work with less pain. Recreational therapy. This involves trying new activities or hobbies, such as a physical  activity or drawing. Mental health therapy, including: Cognitive behavioral therapy (CBT). This helps you learn coping skills for dealing with pain. Acceptance and commitment therapy (ACT) to change the way you think and react to pain. Relaxation therapies, including muscle relaxation exercises and mindfulness-based stress reduction. Pain management counseling. This may be individual, family, or group counseling.  Receive medical treatments Medical treatments for pain management include: Nerve block injections. These may include a pain blocker and anti-inflammatory medicines. You may have injections: Near the spine to relieve chronic back or neck pain. Into joints to relieve back or joint pain. Into nerve areas that supply a painful area to relieve body pain. Into muscles (trigger point injections) to relieve some painful muscle conditions. A medical device placed near your spine to help block pain signals and relieve nerve pain or chronic back pain (spinal cord stimulation device). Acupuncture. Follow these instructions at home Medicines Take over-the-counter and prescription medicines only as told by your health care provider. If you are taking pain medicine, ask your health care providers about possible side effects to watch out for. Do not drive or use heavy machinery while taking prescription opioid pain medicine. Lifestyle  Do not use drugs or alcohol to reduce pain. If you drink alcohol, limit how much you have to: 0-1 drink a day for women who are not pregnant. 0-2 drinks a day for men. Know how much alcohol is in a drink. In the [place] one drink equals one 12 oz bottle of beer (355 mL), one 5 oz glass of wine (148 mL), or one 1 oz glass of hard liquor (44 mL). Do not use any products that contain nicotine or tobacco. These products include cigarettes, chewing tobacco, and vaping devices, such as e-cigarettes. If you need help quitting, ask your health care provider. Eat a healthy  diet and maintain a healthy weight. Poor diet and excess weight may make pain worse. Eat foods that are high in fiber. These include fresh fruits and vegetables, whole grains, and beans. Limit foods that are high in fat and processed sugars,  such as fried and sweet foods. Exercise regularly. Exercise lowers stress and may help relieve pain. Ask your health care provider what activities and exercises are safe for you. If your health care provider approves, join an exercise class that combines movement and stress reduction. Examples include yoga and tai chi. Get enough sleep. Lack of sleep may make pain worse. Lower stress as much as possible. Practice stress reduction techniques as told by your therapist. General instructions Work with all your pain management providers to find the treatments that work best for you. You are an important member of your pain management team. There are many things you can do to reduce pain on your own. Consider joining an online or in-person support group for people who have chronic pain. Keep all follow-up visits. This is important. Where to find more information You can find more information about managing pain without opioids from: American Academy of Pain Medicine: painmed.org Institute for Chronic Pain: instituteforchronicpain.org American Chronic Pain Association: theacpa.org Contact a health care provider if: You have side effects from pain medicine. Your pain gets worse or does not get better with treatments or home therapy. You are struggling with anxiety or depression. Summary Many types of pain can be managed without opioids. Chronic pain may respond better to pain management without opioids. Pain is best managed when you and a team of health care providers work together. Pain management without opioids may include non-opioid medicines, medical treatments, physical therapy, mental health therapy, and lifestyle changes. Tell your health care providers  if your pain gets worse or is not being managed well enough. This information is not intended to replace advice given to you by your health care provider. Make sure you discuss any questions you have with your health care provider. Document Revised: 05/01/2020 Document Reviewed: 05/01/2020 Elsevier Patient Education  2023 [employer].

## 2022-06-25 NOTE — Progress Notes (Signed)
 I connected with  Tina Watkins on 06/25/22 by a audio enabled telemedicine application and verified that I am speaking with the correct person using two identifiers.  Patient Location: Home  Provider Location: Home Office  I discussed the limitations of evaluation and management by telemedicine. The patient expressed understanding and agreed to proceed.  Subjective:   Tina Watkins is a 57 y.o. female who presents for Medicare Annual (Subsequent) preventive examination.  Review of Systems            Objective:    Today's Vitals   06/25/22 1412 06/25/22 1414  BP: 105/63   Weight: 114 lb (51.7 kg)   Height: 4\' 11"  (1.499 m)   PainSc: 8  8   PainLoc: Back    Body mass index is 23.03 kg/m.     06/25/2022    2:25 PM 12/10/2021    9:25 AM 09/06/2021    5:00 PM 09/06/2021    7:50 AM 07/18/2021    7:21 AM 03/30/2021    7:48 AM 03/14/2021   10:02 AM  Advanced Directives  Does Patient Have a Medical Advance Directive? No No No No No No No  Would patient like information on creating a medical advance directive? No - Patient declined No - Patient declined No - Patient declined  No - Patient declined  No - Patient declined    Current Medications (verified) Outpatient Encounter Medications as of 06/25/2022  Medication Sig   allopurinol (ZYLOPRIM) 100 MG tablet TAKE 1 TABLET BY MOUTH EVERY DAY   amiodarone (PACERONE) 200 MG tablet TAKE 1 TABLET BY MOUTH EVERY DAY   Colchicine 0.6 MG CAPS Take 0.6 mg by mouth daily as needed (gout flare up).   diclofenac Sodium (VOLTAREN) 1 % GEL Apply 4 g topically as needed.   diphenhydrAMINE (BENADRYL) 25 mg capsule Take 1 capsule (25 mg total) by mouth every 8 (eight) hours as needed.   dorzolamide-timolol (COSOPT) 2-0.5 % ophthalmic solution Place 1 drop into the left eye 2 (two) times daily.   ELIQUIS 2.5 MG TABS tablet TAKE 1 TABLET BY MOUTH TWICE A DAY   escitalopram (LEXAPRO) 20 MG tablet Take 20 mg by mouth daily.   famotidine  (PEPCID) 20 MG tablet TAKE 1 TABLET BY MOUTH TWICE A DAY   fluticasone-salmeterol (ADVAIR HFA) 115-21 MCG/ACT inhaler Inhale 2 puffs into the lungs 2 (two) times daily.   gabapentin (NEURONTIN) 100 MG capsule TAKE 1 CAPSULE BY MOUTH TWICE A DAY MAX 2 CAPSULES DAILY (Patient taking differently: Take 100 mg by mouth 3 (three) times daily.)   lidocaine (LIDODERM) 5 % Place 1 patch onto the skin daily as needed (for pain- remove old patch first).    metoCLOPramide (REGLAN) 5 MG tablet Take 1 tablet (5 mg total) by mouth 2 (two) times daily before a meal.   midodrine (PROAMATINE) 10 MG tablet Take 1.5 tablets (15 mg total) by mouth 3 (three) times daily. NEEDS FOLLOW UP APPOINTMENT FOR ANYMORE REFILLS   omeprazole (PRILOSEC) 40 MG capsule TAKE 1 CAPSULE (40 MG TOTAL) BY MOUTH DAILY.   ondansetron (ZOFRAN-ODT) 4 MG disintegrating tablet Take 1 tablet (4 mg total) by mouth every 8 (eight) hours as needed for nausea or vomiting.   oxyCODONE-acetaminophen (PERCOCET) 10-325 MG tablet Take 1 tablet by mouth 4 (four) times daily as needed for pain.   Vitamin D, Ergocalciferol, (DRISDOL) 1.25 MG (50000 UNIT) CAPS capsule TAKE 1 CAPSULE (50,000 UNITS TOTAL) BY MOUTH EVERY 7 (SEVEN) DAYS  zolpidem (AMBIEN) 10 MG tablet Take 1 tablet (10 mg total) by mouth at bedtime as needed for sleep.   busPIRone (BUSPAR) 10 MG tablet Take 5 mg by mouth 2 (two) times daily. (Patient not taking: Reported on 01/14/2022)   cyclobenzaprine (FLEXERIL) 5 MG tablet Take 1 tablet (5 mg total) by mouth at bedtime as needed. (Patient not taking: Reported on 06/15/2022)   fluticasone (FLONASE) 50 MCG/ACT nasal spray Place 1 spray into both nostrils daily as needed for allergies or rhinitis. (Patient not taking: Reported on 03/09/2022)   No facility-administered encounter medications on file as of 06/25/2022.    Allergies (verified) Levofloxacin and Tape   History: Past Medical History:  Diagnosis Date   Bacteremia due to Gram-negative  bacteria 05/23/2011   Blind    right eye   Blind right eye    CHF (congestive heart failure) (HCC)    Chronic lower back pain    Complication of anesthesia    "woke up during OR; I have an extremely high tolerance" (12/11/2011) 1 procedure was graft; the other procedures were procedures that are typically done with sedation.   DDD (degenerative disc disease), cervical    Depression    Dysrhythmia    "tachycardia" (12/11/2011) new onset afib 10/15/14 EKG   E coli bacteremia 06/18/2011   Elevated LDL cholesterol level 12/2018   ESRD (end stage renal disease) (HCC) 06/12/2011   Fibromyalgia    Gastroparesis    Gastroparesis    GERD (gastroesophageal reflux disease)    Glaucoma    right eye   Gout    H/O carpal tunnel syndrome    Headache 10/2019   Headache(784.0)    "not often anymore" (12/11/2011)   Herpes genitalia 1994   History of blood transfusion    "more than a few times" (12/11/2011)   History of stomach ulcers    Hypotension    Iron deficiency anemia    New onset a-fib (HCC)    10/15/14 EKG   Osteopenia    Peripheral neuropathy 11/2018   Pressure ulcer of sacral region, stage 1 07/2019   Seizures (HCC) 1994   "post transplant; only have had that one" (12/11/2011)   Spinal stenosis in cervical region    Stroke [facility])     left basal ganglia lacunar infarct; Right frontal lobe lacunar infarct.   Stroke [facility]) ~ 1999; 2001   "briefly lost my vision; lost my right eye" (12/11/2011)   Vitamin D deficiency 12/2018   Past Surgical History:  Procedure Laterality Date   ANTERIOR CERVICAL DECOMP/DISCECTOMY FUSION N/A 01/08/2015   Procedure: Anterior Cervical Three-Four/Four-Five Decompression/Diskectomy/Fusion;  Surgeon: Hilda Lias, MD;  Location: [facility] NEURO ORS;  Service: Neurosurgery;  Laterality: N/A;  C3-4 C4-5 Anterior cervical decompression/diskectomy/fusion   APPENDECTOMY  ~ 2004   BIOPSY  09/12/2020   Procedure: BIOPSY;  Surgeon: Rachael Fee, MD;  Location: [facility]  ENDOSCOPY;  Service: Endoscopy;;   CATARACT EXTRACTION     right eye   COLONOSCOPY     COLONOSCOPY WITH PROPOFOL N/A 09/12/2020   Procedure: COLONOSCOPY WITH PROPOFOL;  Surgeon: Rachael Fee, MD;  Location: [facility] ENDOSCOPY;  Service: Endoscopy;  Laterality: N/A;   ENUCLEATION  2001   "right"   ESOPHAGOGASTRODUODENOSCOPY (EGD) WITH PROPOFOL N/A 04/21/2012   Procedure: ESOPHAGOGASTRODUODENOSCOPY (EGD) WITH PROPOFOL;  Surgeon: Rachael Fee, MD;  Location: [facility] ENDOSCOPY;  Service: Endoscopy;  Laterality: N/A;   ESOPHAGOGASTRODUODENOSCOPY (EGD) WITH PROPOFOL N/A 09/12/2020   Procedure: ESOPHAGOGASTRODUODENOSCOPY (EGD) WITH PROPOFOL;  Surgeon: Christella Hartigan,  Melton Alar, MD;  Location: [facility]COPY;  Service: Endoscopy;  Laterality: N/A;   INSERTION OF DIALYSIS CATHETER  1988   "AV graft LUA & LFA; LUA worked for 1 day; LFA never worked"   IR FLUORO GUIDE CV LINE RIGHT  07/11/2019   IR FLUORO GUIDE CV LINE RIGHT  10/20/2019   IR FLUORO GUIDE CV LINE RIGHT  05/31/2020   IR PTA VENOUS EXCEPT DIALYSIS CIRCUIT  05/31/2020   IR RADIOLOGY PERIPHERAL GUIDED IV START  07/11/2019   IR US GUIDE VASC ACCESS RIGHT  07/11/2019   IR US GUIDE VASC ACCESS RIGHT  07/11/2019   IR US GUIDE VASC ACCESS RIGHT  07/11/2019   IR VENOCAVAGRAM IVC  05/31/2020   KIDNEY TRANSPLANT  1994; 1999; 2005   "right"   MULTIPLE TOOTH EXTRACTIONS     POLYPECTOMY  09/12/2020   Procedure: POLYPECTOMY;  Surgeon: Rachael Fee, MD;  Location: [facility] ENDOSCOPY;  Service: Endoscopy;;   RIGHT HEART CATH N/A 03/23/2019   Procedure: RIGHT HEART CATH;  Surgeon: Dolores Patty, MD;  Location: [facility] INVASIVE CV LAB;  Service: Cardiovascular;  Laterality: N/A;   TONSILLECTOMY     TOTAL NEPHRECTOMY  1988?; 1994; 2005   Family History  Problem Relation Age of Onset   Glaucoma Mother    Pancreatic cancer Father    Multiple sclerosis Brother    Diabetes Maternal Uncle    Hypertension Maternal Grandmother    Breast cancer Neg Hx    Social History    Socioeconomic History   Marital status: Single    Spouse name: Not on file   Number of children: 0   Years of education: some college   Highest education level: Some college, no degree  Occupational History   Occupation: disabled    Associate Professor: UNEMPLOYED  Tobacco Use   Smoking status: Every Day    Packs/day: 1.50    Years: 35.00    Additional pack years: 0.00    Total pack years: 52.50    Types: Cigarettes    Passive exposure: Never   Smokeless tobacco: Never   Tobacco comments:    Pack of cigarettes lasts 2 days 09/18/2020  Vaping Use   Vaping Use: Never used  Substance and Sexual Activity   Alcohol use: Not Currently    Comment: rarely   Drug use: Yes    Frequency: 1.0 times per week    Types: Marijuana    Comment: occ   Sexual activity: Not Currently    Partners: Male    Birth control/protection: None  Other Topics Concern   Not on file  Social History Narrative   Right-handed.   Four cups caffeine per day.   Lives alone.   Social Determinants of Health   Financial Resource Strain: Low Risk  (06/25/2022)   Overall Financial Resource Strain (CARDIA)    Difficulty of Paying Living Expenses: Not hard at all  Food Insecurity: No Food Insecurity (06/25/2022)   Hunger Vital Sign    Worried About Running Out of Food in the Last Year: Never true    Ran Out of Food in the Last Year: Never true  Transportation Needs: No Transportation Needs (06/21/2022)   PRAPARE - Administrator, Civil Service (Medical): No    Lack of Transportation (Non-Medical): No  Physical Activity: Inactive (06/25/2022)   Exercise Vital Sign    Days of Exercise per Week: 0 days    Minutes of Exercise per Session: 0 min  Stress: Stress Concern  Present (06/25/2022)   [employer] of Occupational Health - Occupational Stress Questionnaire    Feeling of Stress : Very much  Social Connections: Socially Isolated (06/25/2022)   Social Connection and Isolation Panel [NHANES]     Frequency of Communication with Friends and Family: More than three times a week    Frequency of Social Gatherings with Friends and Family: Once a week    Attends Religious Services: Never    Database administrator or Organizations: No    Attends Engineer, structural: Never    Marital Status: Never married    Tobacco Counseling Ready to quit: Yes Counseling given: Yes Tobacco comments: Pack of cigarettes lasts 2 days 09/18/2020   Clinical Intake:  Pre-visit preparation completed: Yes  Pain : 0-10 Pain Score: 8  Pain Type: Chronic pain Pain Location: Back Pain Orientation: Upper Pain Radiating Towards: shoulders Pain Onset: More than a month ago Pain Frequency: Constant     BMI - recorded: 23.03 Nutritional Status: BMI of 19-24  Normal Nutritional Risks: None Diabetes: No  How often do you need to have someone help you when you read instructions, pamphlets, or other written materials from your doctor or pharmacy?: 1 - Never  Diabetic?no   Interpreter Needed?: No  Information entered by ::  Herberto Ledwell, CMA   Activities of Daily Living    06/24/2022    3:53 PM 09/06/2021    5:00 PM  In your present state of health, do you have any difficulty performing the following activities:  Hearing? 0   Vision? 0   Difficulty concentrating or making decisions? 0   Walking or climbing stairs? 1   Dressing or bathing? 0   Doing errands, shopping? 0 0  Preparing Food and eating ? N   Using the Toilet? N   In the past six months, have you accidently leaked urine? Y   Do you have problems with loss of bowel control? N   Managing your Medications? N   Managing your Finances? N   Housekeeping or managing your Housekeeping? Y     Patient Care Team: Ivonne Andrew, NP as PCP - General (Pulmonary Disease) O'Neal, Ronnald Ramp, MD as PCP - Cardiology (Cardiology) Bensimhon, Bevelyn Buckles, MD as PCP - Advanced Heart Failure (Cardiology)  Indicate any recent Medical  Services you may have received from other than [facility] providers in the past year (date may be approximate).     Assessment:   This is a routine wellness examination for Tina Watkins.  Hearing/Vision screen Hearing Screening - Comments:: Patient denies any hearing difficulties.   Vision Screening - Comments:: Wears rx glasses - up to date with routine eye exams with    Dietary issues and exercise activities discussed:     Goals Addressed             This Visit's Progress    Patient Stated       Patient states her goal is to be able to put her hair in a ponytail       Depression Screen    06/25/2022    2:20 PM 06/22/2022   11:33 AM 01/12/2022    9:03 AM 04/24/2021    3:17 PM 04/07/2021    8:18 AM 10/23/2020    3:53 PM 01/29/2020   11:09 AM  PHQ 2/9 Scores  PHQ - 2 Score 2 0 3 3 4 5 3   PHQ- 9 Score 7  10 13 7 17 14     Fall Risk  06/24/2022    3:53 PM 04/24/2021    3:17 PM 04/07/2021    8:18 AM 10/23/2020    3:53 PM 07/19/2020    3:27 PM  Fall Risk   Falls in the past year? 0 0 0 0 0  Number falls in past yr: 0 0  0 0  Injury with Fall? 0 0  0 0  Risk for fall due to :  No Fall Risks     Follow up  Falls evaluation completed;Falls prevention discussed       FALL RISK PREVENTION PERTAINING TO THE HOME:  Any stairs in or around the home? No  If so, are there any without handrails? Yes  Home free of loose throw rugs in walkways, pet beds, electrical cords, etc? yes Adequate lighting in your home to reduce risk of falls? Yes   ASSISTIVE DEVICES UTILIZED TO PREVENT FALLS:  Life alert? No  Use of a cane, walker or w/c? No  Grab bars in the bathroom? No  Shower chair or bench in shower? Yes  Elevated toilet seat or a handicapped toilet? No   TIMED UP AND GO:  Was the test performed? No . Telephone visit  Cognitive Function:        06/25/2022    2:26 PM 04/24/2021    3:20 PM  6CIT Screen  What Year? 0 points 0 points  What month? 0 points 0 points  What time?  0 points 0 points  Count back from 20 0 points 0 points  Months in reverse 0 points 0 points  Repeat phrase 0 points 0 points  Total Score 0 points 0 points    Immunizations Immunization History  Administered Date(s) Administered   Hepatitis B, PED/ADOLESCENT 07/20/2019, 08/29/2019, 09/21/2019   Hepb-cpg 07/20/2019, 08/29/2019, 09/21/2019, 11/23/2019   Influenza Split 11/07/2019   Influenza, Quadrivalent, Recombinant, Inj, Pf 11/11/2021   Influenza,inj,Quad PF,6+ Mos 11/07/2019, 11/07/2020   PFIZER Comirnaty(Gray Top)Covid-19 Tri-Sucrose Vaccine 07/29/2020   PFIZER(Purple Top)SARS-COV-2 Vaccination 05/31/2019, 06/21/2019, 10/26/2019   PNEUMOCOCCAL CONJUGATE-20 06/04/2022   PPD Test 08/10/2017   Pfizer Covid-19 Vaccine Bivalent Booster 16yrs & up 02/06/2021    TDAP status: Due, Education has been provided regarding the importance of this vaccine. Advised may receive this vaccine at local pharmacy or Health Dept. Aware to provide a copy of the vaccination record if obtained from local pharmacy or Health Dept. Verbalized acceptance and understanding.  Flu Vaccine status: Up to date  Pneumococcal vaccine status: Due, Education has been provided regarding the importance of this vaccine. Advised may receive this vaccine at local pharmacy or Health Dept. Aware to provide a copy of the vaccination record if obtained from local pharmacy or Health Dept. Verbalized acceptance and understanding.  Covid-19 vaccine status: Information provided on how to obtain vaccines.   Qualifies for Shingles Vaccine? Yes   Zostavax completed No   Shingrix Completed?: No.    Education has been provided regarding the importance of this vaccine. Patient has been advised to call insurance company to determine out of pocket expense if they have not yet received this vaccine. Advised may also receive vaccine at local pharmacy or Health Dept. Verbalized acceptance and understanding.  Screening Tests Health  Maintenance  Topic Date Due   DTaP/Tdap/Td (1 - Tdap) Never done   Zoster Vaccines- Shingrix (1 of 2) Never done   PAP SMEAR-Modifier  Never done   COVID-19 Vaccine (6 - 2023-24 season) 10/03/2021   INFLUENZA VACCINE  09/03/2022   Lung Cancer Screening  09/25/2022   MAMMOGRAM  05/24/2023   Medicare Annual Wellness (AWV)  06/25/2023   Colonoscopy  09/13/2030   Hepatitis C Screening  Completed   HIV Screening  Completed   HPV VACCINES  Aged Out    Health Maintenance  Health Maintenance Due  Topic Date Due   DTaP/Tdap/Td (1 - Tdap) Never done   Zoster Vaccines- Shingrix (1 of 2) Never done   PAP SMEAR-Modifier  Never done   COVID-19 Vaccine (6 - 2023-24 season) 10/03/2021    Colorectal cancer screening: Type of screening: Colonoscopy. Completed 09/12/2020. Repeat every 10 years  Mammogram status: Ordered 06/25/22. Pt provided with contact info and advised to call to schedule appt.   Bone Density Screening: Patient does not qualify  Lung Cancer Screening: (Low Dose CT Chest recommended if Age 31-80 years, 30 pack-year currently smoking OR have quit w/in 15years.) does qualify.   Lung Cancer Screening Referral: Ordered 06/25/22  Additional Screening:  Hepatitis C Screening: does qualify; Completed 2023 Dialysis will fax results  Vision Screening: Recommended annual ophthalmology exams for early detection of glaucoma and other disorders of the eye. Is the patient up to date with their annual eye exam?  Yes  Who is the provider or what is the name of the office in which the patient attends annual eye exams? [facility]/Dr. Orson Slick   Dental Screening: Recommended annual dental exams for proper oral hygiene  Community Resource Referral / Chronic Care Management: CRR required this visit?  No   CCM required this visit?  No      Plan:     I have personally reviewed and noted the following in the patient's chart:   Medical and social history Use of alcohol,  tobacco or illicit drugs  Current medications and supplements including opioid prescriptions. Patient is currently taking opioid prescriptions. Information provided to patient regarding non-opioid alternatives. Patient advised to discuss non-opioid treatment plan with their provider. Functional ability and status Nutritional status Physical activity Advanced directives List of other physicians Hospitalizations, surgeries, and ER visits in previous 12 months Vitals Screenings to include cognitive, depression, and falls Referrals and appointments  In addition, I have reviewed and discussed with patient certain preventive protocols, quality metrics, and best practice recommendations. A written personalized care plan for preventive services as well as general preventive health recommendations were provided to patient.   Due to this being a telephonic visit, the after visit summary with patients personalized plan was offered to patient via mail or my-chart. Patient would like to access their AVS via my-chart    Jordan Hawks Kelven Flater, CMA   06/25/2022   Nurse Notes: Orders placed for health maint. Gaps. Patient aware.   Patient is due for a pap smear

## 2022-07-01 ENCOUNTER — Ambulatory Visit (HOSPITAL_COMMUNITY)
Admission: RE | Admit: 2022-07-01 | Discharge: 2022-07-01 | Disposition: A | Discharge status: Home / Self Care | Payer: Medicare Other | Source: Ambulatory Visit | Attending: Internal Medicine | Admitting: Internal Medicine

## 2022-07-01 VITALS — BP 88/70 | HR 67 | Ht 59.0 in | Wt 111.4 lb

## 2022-07-01 DIAGNOSIS — D6869 Other thrombophilia: Secondary | ICD-10-CM | POA: Diagnosis not present

## 2022-07-01 DIAGNOSIS — K219 Gastro-esophageal reflux disease without esophagitis: Secondary | ICD-10-CM | POA: Diagnosis not present

## 2022-07-01 DIAGNOSIS — Z79899 Other long term (current) drug therapy: Secondary | ICD-10-CM | POA: Insufficient documentation

## 2022-07-01 DIAGNOSIS — I959 Hypotension, unspecified: Secondary | ICD-10-CM | POA: Insufficient documentation

## 2022-07-01 DIAGNOSIS — N186 End stage renal disease: Secondary | ICD-10-CM | POA: Diagnosis not present

## 2022-07-01 DIAGNOSIS — I4891 Unspecified atrial fibrillation: Secondary | ICD-10-CM | POA: Insufficient documentation

## 2022-07-01 DIAGNOSIS — Z992 Dependence on renal dialysis: Secondary | ICD-10-CM | POA: Diagnosis not present

## 2022-07-01 DIAGNOSIS — Z5181 Encounter for therapeutic drug level monitoring: Secondary | ICD-10-CM | POA: Insufficient documentation

## 2022-07-01 DIAGNOSIS — F17210 Nicotine dependence, cigarettes, uncomplicated: Secondary | ICD-10-CM | POA: Diagnosis not present

## 2022-07-01 DIAGNOSIS — I4892 Unspecified atrial flutter: Secondary | ICD-10-CM | POA: Insufficient documentation

## 2022-07-01 LAB — COMPREHENSIVE METABOLIC PANEL
ALT: 15 U/L (ref 0–44)
AST: 22 U/L (ref 15–41)
Albumin: 3.8 g/dL (ref 3.5–5.0)
Alkaline Phosphatase: 140 U/L — ABNORMAL HIGH (ref 38–126)
Anion gap: 13 (ref 5–15)
BUN: 18 mg/dL (ref 6–20)
CO2: 26 mmol/L (ref 22–32)
Calcium: 9.7 mg/dL (ref 8.9–10.3)
Chloride: 94 mmol/L — ABNORMAL LOW (ref 98–111)
Creatinine, Ser: 5.58 mg/dL — ABNORMAL HIGH (ref 0.44–1.00)
GFR, Estimated: 9 mL/min — ABNORMAL LOW (ref >60)
Glucose, Bld: 93 mg/dL (ref 70–99)
Potassium: 4 mmol/L (ref 3.5–5.1)
Sodium: 133 mmol/L — ABNORMAL LOW (ref 135–145)
Total Bilirubin: 0.7 mg/dL (ref 0.3–1.2)
Total Protein: 6.8 g/dL (ref 6.5–8.1)

## 2022-07-01 LAB — TSH: TSH: 5.001 u[IU]/mL — ABNORMAL HIGH (ref 0.350–4.500)

## 2022-07-01 NOTE — Progress Notes (Signed)
Primary Care Physician: Ivonne Andrew, NP Referring Physician: AHF AHF MD: Dr. Alphonse Guild is a 57 y.o. female with a h/o ESRD s/p failed renal transplant now on HD TTS, orthostatic hypotension, chronic diastolic CHF, moderate pHTN, HTN and gout brought to ED from outpatient HD with rapid A.flutter and fever.  Reportedly had fever with heart rates in 150s after 1 hour of HD session.  She had 20 mg IV Cardizem push.  RVR resolved but she became hypotensive.  In ED, febrile to 100.5.  No leukocytosis.  UA, CXR and CT abdomen and pelvis without significant finding.  She was hypotensive but blood pressure improved with 500 cc bolus.  She was in sinus rhythm.  Blood cultures obtained.  She was a started on broad-spectrum antibiotics for SIRS.  The next day, blood pressure improved.  Remained in sinus rhythm.  Blood cultures NGTD. TTE without significant finding.  Cleared by nephrology for discharge.  She felt well and asked to go home.  Nephrology to follow-up on further update of her blood culture and address accordingly.  She was seen in AHF clinc, 04/03/20. It was noted that pt's monitor showed less thatn 1% aflutter  burden and was referred here to see if antiarrythmic's were indicated. Ekg today shows NSR. Pt denies having any tachy arrhythmia's. She has not started eliquis as she was waiting  to have 5 teeth extractions end of April. She has a CHA2DS2VASc score of 6 with a previous stroke. She states that when she had Covid last June, that it caused her to lose her kidney transplant that she had for 16 years done in the [place] area. Hence she had to start back on dialysis. She was started on amiodarone 04/23/20 for rapid a flutter occurring with dialysis, dose ws reduced to 100 mg daily at a later date, for mild elevation of TSH.  F/u in afib clinic, 09/18/20. She had Covid  in June with Pneumonia, She states that she recovered quickly.  EKG today shows  SR. SHe has noted very  little rapid heart rate . Will usually occur at the tail end of dialysis, of very short duration.   F/u in afib clinic, 03/26/21.she has not noted any afib. Continues on amiodarone 200 mg daily. Has dialysis yesterday. Has noted chest congestion x 3-4 days. No fever. Productive cough. Rhonchi heard throughout both lungs. Continues to smoke 1/2 pack a day. PO 97%.   F/u in afib clinic, 10/01/21. She was recently in the hospital early August for N/V associated with gastroparesis. This was acute on chronic. N/V improved while in the hospital but he had a spell of N/V yesterday. At times, she misses her meds 2/2 N/V. She is in SR, no afib episodes to report.   F/u in afib clinic, 07/01/22. She is currently in NSR. New recent episodes of Afib. She is compliant with amiodarone and anticoagulant.  Today, she denies symptoms of palpitations, chest pain, shortness of breath, orthopnea, PND, lower extremity edema, dizziness, presyncope, syncope, or neurologic sequela. The patient is tolerating medications without difficulties and is otherwise without complaint today.   Past Medical History:  Diagnosis Date   Bacteremia due to Gram-negative bacteria 05/23/2011   Blind    right eye   Blind right eye    CHF (congestive heart failure) (HCC)    Chronic lower back pain    Complication of anesthesia    "woke up during OR; I have an extremely high tolerance" (  12/11/2011) 1 procedure was graft; the other procedures were procedures that are typically done with sedation.   DDD (degenerative disc disease), cervical    Depression    Dysrhythmia    "tachycardia" (12/11/2011) new onset afib 10/15/14 EKG   E coli bacteremia 06/18/2011   Elevated LDL cholesterol level 12/2018   ESRD (end stage renal disease) (HCC) 06/12/2011   Fibromyalgia    Gastroparesis    Gastroparesis    GERD (gastroesophageal reflux disease)    Glaucoma    right eye   Gout    H/O carpal tunnel syndrome    Headache 10/2019   Headache(784.0)     "not often anymore" (12/11/2011)   Herpes genitalia 1994   History of blood transfusion    "more than a few times" (12/11/2011)   History of stomach ulcers    Hypotension    Iron deficiency anemia    New onset a-fib (HCC)    10/15/14 EKG   Osteopenia    Peripheral neuropathy 11/2018   Pressure ulcer of sacral region, stage 1 07/2019   Seizures (HCC) 1994   "post transplant; only have had that one" (12/11/2011)   Spinal stenosis in cervical region    Stroke [facility])     left basal ganglia lacunar infarct; Right frontal lobe lacunar infarct.   Stroke [facility]) ~ 1999; 2001   "briefly lost my vision; lost my right eye" (12/11/2011)   Vitamin D deficiency 12/2018   Past Surgical History:  Procedure Laterality Date   ANTERIOR CERVICAL DECOMP/DISCECTOMY FUSION N/A 01/08/2015   Procedure: Anterior Cervical Three-Four/Four-Five Decompression/Diskectomy/Fusion;  Surgeon: Hilda Lias, MD;  Location: [facility];  Service: Neurosurgery;  Laterality: N/A;  C3-4 C4-5 Anterior cervical decompression/diskectomy/fusion   APPENDECTOMY  ~ 2004   BIOPSY  09/12/2020   Procedure: BIOPSY;  Surgeon: Rachael Fee, MD;  Location: [facility] ENDOSCOPY;  Service: Endoscopy;;   CATARACT EXTRACTION     right eye   COLONOSCOPY     COLONOSCOPY WITH PROPOFOL N/A 09/12/2020   Procedure: COLONOSCOPY WITH PROPOFOL;  Surgeon: Rachael Fee, MD;  Location: [facility] ENDOSCOPY;  Service: Endoscopy;  Laterality: N/A;   ENUCLEATION  2001   "right"   ESOPHAGOGASTRODUODENOSCOPY (EGD) WITH PROPOFOL N/A 04/21/2012   Procedure: ESOPHAGOGASTRODUODENOSCOPY (EGD) WITH PROPOFOL;  Surgeon: Rachael Fee, MD;  Location: [facility] END[facility];  Service: Endoscopy;  Laterality: N/A;   ESOPHAGOGASTRODUODENOSCOPY (EGD) WITH PROPOFOL N/A 09/12/2020   Procedure: ESOPHAGOGASTRODUODENOSCOPY (EGD) WITH PROPOFOL;  Surgeon: Rachael Fee, MD;  Location: [facility];  Service: Endoscopy;  Laterality: N/A;   INSERTION OF DIALYSIS CATHETER  1988   "AV graft LUA  & LFA; LUA worked for 1 day; LFA never worked"   IR FLUORO GUIDE CV LINE RIGHT  07/11/2019   IR FLUORO GUIDE CV LINE RIGHT  10/20/2019   IR FLUORO GUIDE CV LINE RIGHT  05/31/2020   IR PTA VENOUS EXCEPT DIALYSIS CIRCUIT  05/31/2020   IR RADIOLOGY PERIPHERAL GUIDED IV START  07/11/2019   IR US GUIDE VASC ACCESS RIGHT  07/11/2019   IR US GUIDE VASC ACCESS RIGHT  07/11/2019   IR US GUIDE VASC ACCESS RIGHT  07/11/2019   IR VENOCAVAGRAM IVC  05/31/2020   KIDNEY TRANSPLANT  1994; 1999; 2005   "right"   MULTIPLE TOOTH EXTRACTIONS     POLYPECTOMY  09/12/2020   Procedure: POLYPECTOMY;  Surgeon: Rachael Fee, MD;  Location: [facility];  Service: Endoscopy;;   RIGHT HEART CATH N/A 03/23/2019   Procedure: RIGHT HEART CATH;  Surgeon: Dolores Patty, MD;  Location: [facility] INVASIVE CV LAB;  Service: Cardiovascular;  Laterality: N/A;   TONSILLECTOMY     TOTAL NEPHRECTOMY  1988?; 1994; 2005    Current Outpatient Medications  Medication Sig Dispense Refill   allopurinol (ZYLOPRIM) 100 MG tablet TAKE 1 TABLET BY MOUTH EVERY DAY 90 tablet 3   amiodarone (PACERONE) 200 MG tablet TAKE 1 TABLET BY MOUTH EVERY DAY 90 tablet 1   busPIRone (BUSPAR) 10 MG tablet Take 5 mg by mouth 2 (two) times daily. (Patient not taking: Reported on 01/14/2022)     Colchicine 0.6 MG CAPS Take 0.6 mg by mouth daily as needed (gout flare up). 30 capsule 2   cyclobenzaprine (FLEXERIL) 5 MG tablet Take 1 tablet (5 mg total) by mouth at bedtime as needed. (Patient not taking: Reported on 06/15/2022) 30 tablet 0   diclofenac Sodium (VOLTAREN) 1 % GEL Apply 4 g topically as needed. 50 g 0   diphenhydrAMINE (BENADRYL) 25 mg capsule Take 1 capsule (25 mg total) by mouth every 8 (eight) hours as needed. 30 capsule 6   dorzolamide-timolol (COSOPT) 2-0.5 % ophthalmic solution Place 1 drop into the left eye 2 (two) times daily.     ELIQUIS 2.5 MG TABS tablet TAKE 1 TABLET BY MOUTH TWICE A DAY 60 tablet 3   escitalopram (LEXAPRO) 20 MG tablet Take 20  mg by mouth daily.     famotidine (PEPCID) 20 MG tablet TAKE 1 TABLET BY MOUTH TWICE A DAY 180 tablet 3   fluticasone (FLONASE) 50 MCG/ACT nasal spray Place 1 spray into both nostrils daily as needed for allergies or rhinitis. (Patient not taking: Reported on 03/09/2022)     fluticasone-salmeterol (ADVAIR HFA) 115-21 MCG/ACT inhaler Inhale 2 puffs into the lungs 2 (two) times daily. 1 each 12   gabapentin (NEURONTIN) 100 MG capsule TAKE 1 CAPSULE BY MOUTH TWICE A DAY MAX 2 CAPSULES DAILY (Patient taking differently: Take 100 mg by mouth 3 (three) times daily.) 60 capsule 1   lidocaine (LIDODERM) 5 % Place 1 patch onto the skin daily as needed (for pain- remove old patch first).      metoCLOPramide (REGLAN) 5 MG tablet Take 1 tablet (5 mg total) by mouth 2 (two) times daily before a meal. 180 tablet 3   midodrine (PROAMATINE) 10 MG tablet Take 1.5 tablets (15 mg total) by mouth 3 (three) times daily. NEEDS FOLLOW UP APPOINTMENT FOR ANYMORE REFILLS 405 tablet 0   omeprazole (PRILOSEC) 40 MG capsule TAKE 1 CAPSULE (40 MG TOTAL) BY MOUTH DAILY. 90 capsule 1   ondansetron (ZOFRAN-ODT) 4 MG disintegrating tablet Take 1 tablet (4 mg total) by mouth every 8 (eight) hours as needed for nausea or vomiting. 20 tablet 11   oxyCODONE-acetaminophen (PERCOCET) 10-325 MG tablet Take 1 tablet by mouth 4 (four) times daily as needed for pain.     Vitamin D, Ergocalciferol, (DRISDOL) 1.25 MG (50000 UNIT) CAPS capsule TAKE 1 CAPSULE (50,000 UNITS TOTAL) BY MOUTH EVERY 7 (SEVEN) DAYS 5 capsule 6   zolpidem (AMBIEN) 10 MG tablet Take 1 tablet (10 mg total) by mouth at bedtime as needed for sleep. 10 tablet 0   No current facility-administered medications for this visit.    Allergies  Allergen Reactions   Levofloxacin Itching and Rash   Tape Other (See Comments)    "Certain surgical tapes peel off my skin"    Social History   Socioeconomic History   Marital status: Single    Spouse  name: Not on file   Number of  children: 0   Years of education: some college   Highest education level: Some college, no degree  Occupational History   Occupation: disabled    Associate Professor: UNEMPLOYED  Tobacco Use   Smoking status: Every Day    Packs/day: 1.50    Years: 35.00    Additional pack years: 0.00    Total pack years: 52.50    Types: Cigarettes    Passive exposure: Never   Smokeless tobacco: Never   Tobacco comments:    Pack of cigarettes lasts 2 days 09/18/2020  Vaping Use   Vaping Use: Never used  Substance and Sexual Activity   Alcohol use: Not Currently    Comment: rarely   Drug use: Yes    Frequency: 1.0 times per week    Types: Marijuana    Comment: occ   Sexual activity: Not Currently    Partners: Male    Birth control/protection: None  Other Topics Concern   Not on file  Social History Narrative   Right-handed.   Four cups caffeine per day.   Lives alone.   Social Determinants of Health   Financial Resource Strain: Low Risk  (06/25/2022)   Overall Financial Resource Strain (CARDIA)    Difficulty of Paying Living Expenses: Not hard at all  Food Insecurity: No Food Insecurity (06/25/2022)   Hunger Vital Sign    Worried About Running Out of Food in the Last Year: Never true    Ran Out of Food in the Last Year: Never true  Transportation Needs: No Transportation Needs (06/21/2022)   PRAPARE - Administrator, Civil Service (Medical): No    Lack of Transportation (Non-Medical): No  Physical Activity: Inactive (06/25/2022)   Exercise Vital Sign    Days of Exercise per Week: 0 days    Minutes of Exercise per Session: 0 min  Stress: Stress Concern Present (06/25/2022)   [employer] of Occupational Health - Occupational Stress Questionnaire    Feeling of Stress : Very much  Social Connections: Socially Isolated (06/25/2022)   Social Connection and Isolation Panel [NHANES]    Frequency of Communication with Friends and Family: More than three times a week    Frequency of  Social Gatherings with Friends and Family: Once a week    Attends Religious Services: Never    Database administrator or Organizations: No    Attends Banker Meetings: Never    Marital Status: Never married  Intimate Partner Violence: Not At Risk (06/25/2022)   Humiliation, Afraid, Rape, and Kick questionnaire    Fear of Current or Ex-Partner: No    Emotionally Abused: No    Physically Abused: No    Sexually Abused: No    Family History  Problem Relation Age of Onset   Glaucoma Mother    Pancreatic cancer Father    Multiple sclerosis Brother    Diabetes Maternal Uncle    Hypertension Maternal Grandmother    Breast cancer Neg Hx     ROS- All systems are reviewed and negative except as per the HPI above  Physical Exam: There were no vitals filed for this visit.  Wt Readings from Last 3 Encounters:  06/25/22 51.7 kg  06/22/22 52.5 kg  03/09/22 53.1 kg    Labs: Lab Results  Component Value Date   NA 132 (L) 06/10/2022   K 4.1 06/10/2022   CL 95 (L) 06/10/2022   CO2 23 06/10/2022  GLUCOSE 89 06/10/2022   BUN 27 (H) 06/10/2022   CREATININE 5.85 (H) 06/10/2022   CALCIUM 9.8 06/10/2022   PHOS 3.3 08/24/2019   MG 1.7 06/14/2011   Lab Results  Component Value Date   INR 1.0 02/08/2021   Lab Results  Component Value Date   CHOL 202 (H) 07/14/2021   HDL 113 07/14/2021   LDLCALC 79 07/14/2021   TRIG 51 07/14/2021    GEN- The patient is well appearing, alert and oriented x 3 today.   Head- normocephalic, atraumatic Eyes-  Sclera clear, conjunctiva pink Ears- hearing intact Lungs- Clear to ausculation bilaterally, normal work of breathing Heart- Regular rate and rhythm, no murmurs, rubs or gallops, PMI not laterally displaced Extremities- no clubbing, cyanosis, or edema MS- no significant deformity or atrophy Skin- no rash or lesion Psych- euthymic mood, full affect Neuro- strength and sensation are intact   EKG-   Vent. rate 67 BPM PR interval  204 ms QRS duration 72 ms QT/QTcB 456/481 ms P-R-T axes 70 76 63 Normal sinus rhythm Nonspecific ST and T wave abnormality Prolonged QT Abnormal ECG When compared with ECG of 10-Jun-2022 17:53, PREVIOUS ECG IS PRESENT  Echo 03/29/20- 1. Left ventricular ejection fraction, by estimation, is 65 to 70%. The  left ventricle has normal function. The left ventricle has no regional  wall motion abnormalities. Left ventricular diastolic parameters were  normal.   2. Right ventricular systolic function is normal. The right ventricular  size is normal. There is normal pulmonary artery systolic pressure.   3. The mitral valve is normal in structure. No evidence of mitral valve  regurgitation. No evidence of mitral stenosis.   4. The aortic valve is normal in structure. Aortic valve regurgitation is  not visualized. No aortic stenosis is present.   5. The inferior vena cava is normal in size with greater than 50%  respiratory variability, suggesting right atrial pressure of 3 mmHg.   Zio patch-Patch Wear Time:  14 days and 0 hours (2022-02-02T11:43:21-0500 to 2022-02-16T11:43:25-0500)   1. Sinus rhythm - avg HR of 78 bpm. 2. Atrial Flutter occurred (<1% burden), ranging from 113-208 bpm (avg of 148 bpm), the longest lasting 1 hour 16 mins with an avg rate  of 139 bpm. Atrial Flutter was detected within +/- 45 seconds of symptomatic patient event(s).  3. Rare PACs and PVCs.   Stress test- 04/12/20-Study Highlights    The left ventricular ejection fraction is hyperdynamic (>65%). Nuclear stress EF: 75%. There was no ST segment deviation noted during stress. Defect 1: There is a medium defect of mild severity present in the mid anterior location. The study is normal. This is a low risk study.  Assessment and Plan: 1. Atrial  flutter  Doing well staying in rhythm with daily amiodarone. Continue amiodarone 200 mg daily.  Amiodarone is her choice of drug 2/2 ESRD and HD Tsh/cmet today  2.  CHA2DS2VASc score of 6 Eliquis 2.5 mg bid for weight less than 60 KG and ESRD  3. Hypotension  Stable, not symptomatic   F/u in afib clinic in 6 months  Lake Bells, PA-C Afib Clinic [facility] [address] [place], [place] [address] [phone]

## 2022-07-03 ENCOUNTER — Other Ambulatory Visit (HOSPITAL_COMMUNITY): Payer: Self-pay | Admitting: *Deleted

## 2022-07-03 MED ORDER — AMIODARONE HCL 200 MG PO TABS: 100.0000 mg | ORAL_TABLET | Freq: Every day | ORAL | 1 refills | Status: DC

## 2022-07-03 NOTE — Therapy (Signed)
OUTPATIENT PHYSICAL THERAPY EVALUATION   Patient Name: Tina Watkins MRN: [id number] DOB:11/06/1965, 57 y.o., female Today's Date: 07/06/2022   END OF SESSION:  PT End of Session - 07/06/22 0821     Visit Number 1    Number of Visits 9    Date for PT Re-Evaluation 08/31/22    Authorization Type MCR / MCD    Progress Note Due on Visit 10    PT Start Time 0811    PT Stop Time 0850    PT Time Calculation (min) 39 min    Activity Tolerance Patient tolerated treatment well    Behavior During Therapy [facility] for tasks assessed/performed             Past Medical History:  Diagnosis Date   Bacteremia due to Gram-negative bacteria 05/23/2011   Blind    right eye   Blind right eye    CHF (congestive heart failure) (HCC)    Chronic lower back pain    Complication of anesthesia    "woke up during OR; I have an extremely high tolerance" (12/11/2011) 1 procedure was graft; the other procedures were procedures that are typically done with sedation.   DDD (degenerative disc disease), cervical    Depression    Dysrhythmia    "tachycardia" (12/11/2011) new onset afib 10/15/14 EKG   E coli bacteremia 06/18/2011   Elevated LDL cholesterol level 12/2018   ESRD (end stage renal disease) (HCC) 06/12/2011   Fibromyalgia    Gastroparesis    Gastroparesis    GERD (gastroesophageal reflux disease)    Glaucoma    right eye   Gout    H/O carpal tunnel syndrome    Headache 10/2019   Headache(784.0)    "not often anymore" (12/11/2011)   Herpes genitalia 1994   History of blood transfusion    "more than a few times" (12/11/2011)   History of stomach ulcers    Hypotension    Iron deficiency anemia    New onset a-fib (HCC)    10/15/14 EKG   Osteopenia    Peripheral neuropathy 11/2018   Pressure ulcer of sacral region, stage 1 07/2019   Seizures (HCC) 1994   "post transplant; only have had that one" (12/11/2011)   Spinal stenosis in cervical region    Stroke [facility])     left basal ganglia  lacunar infarct; Right frontal lobe lacunar infarct.   Stroke [facility]) ~ 1999; 2001   "briefly lost my vision; lost my right eye" (12/11/2011)   Vitamin D deficiency 12/2018   Past Surgical History:  Procedure Laterality Date   ANTERIOR CERVICAL DECOMP/DISCECTOMY FUSION N/A 01/08/2015   Procedure: Anterior Cervical Three-Four/Four-Five Decompression/Diskectomy/Fusion;  Surgeon: Hilda Lias, MD;  Location: [facility] NEURO ORS;  Service: Neurosurgery;  Laterality: N/A;  C3-4 C4-5 Anterior cervical decompression/diskectomy/fusion   APPENDECTOMY  ~ 2004   BIOPSY  09/12/2020   Procedure: BIOPSY;  Surgeon: Rachael Fee, MD;  Location: [facility] ENDOSCOPY;  Service: Endoscopy;;   CATARACT EXTRACTION     right eye   COLONOSCOPY     COLONOSCOPY WITH PROPOFOL N/A 09/12/2020   Procedure: COLONOSCOPY WITH PROPOFOL;  Surgeon: Rachael Fee, MD;  Location: [facility] ENDOSCOPY;  Service: Endoscopy;  Laterality: N/A;   ENUCLEATION  2001   "right"   ESOPHAGOGASTRODUODENOSCOPY (EGD) WITH PROPOFOL N/A 04/21/2012   Procedure: ESOPHAGOGASTRODUODENOSCOPY (EGD) WITH PROPOFOL;  Surgeon: Rachael Fee, MD;  Location: [facility] ENDOSCOPY;  Service: Endoscopy;  Laterality: N/A;   ESOPHAGOGASTRODUODENOSCOPY (EGD) WITH PROPOFOL N/A 09/12/2020  Procedure: ESOPHAGOGASTRODUODENOSCOPY (EGD) WITH PROPOFOL;  Surgeon: Rachael Fee, MD;  Location: [facility] ENDOSCOPY;  Service: Endoscopy;  Laterality: N/A;   INSERTION OF DIALYSIS CATHETER  1988   "AV graft LUA & LFA; LUA worked for 1 day; LFA never worked"   IR FLUORO GUIDE CV LINE RIGHT  07/11/2019   IR FLUORO GUIDE CV LINE RIGHT  10/20/2019   IR FLUORO GUIDE CV LINE RIGHT  05/31/2020   IR PTA VENOUS EXCEPT DIALYSIS CIRCUIT  05/31/2020   IR RADIOLOGY PERIPHERAL GUIDED IV START  07/11/2019   IR US GUIDE VASC ACCESS RIGHT  07/11/2019   IR US GUIDE VASC ACCESS RIGHT  07/11/2019   IR US GUIDE VASC ACCESS RIGHT  07/11/2019   IR VENOCAVAGRAM IVC  05/31/2020   KIDNEY TRANSPLANT  1994; 1999; 2005   "right"    MULTIPLE TOOTH EXTRACTIONS     POLYPECTOMY  09/12/2020   Procedure: POLYPECTOMY;  Surgeon: Rachael Fee, MD;  Location: [facility] ENDOSCOPY;  Service: Endoscopy;;   RIGHT HEART CATH N/A 03/23/2019   Procedure: RIGHT HEART CATH;  Surgeon: Dolores Patty, MD;  Location: [facility] INVASIVE CV LAB;  Service: Cardiovascular;  Laterality: N/A;   TONSILLECTOMY     TOTAL NEPHRECTOMY  1988?; 1994; 2005   Patient Active Problem List   Diagnosis Date Noted   Encounter for monitoring amiodarone therapy 07/01/2022   Atrial fibrillation (HCC) 07/01/2022   Bilateral hip pain 01/14/2022   Prolonged Q-T interval on ECG 09/07/2021   Nausea & vomiting 09/06/2021   Bilateral shoulder pain 06/06/2021   Bilateral hand swelling 06/06/2021   Cervical radiculitis 01/24/2021   Acute respiratory failure with hypoxia (HCC) 10/23/2020   Coagulation defect, unspecified (HCC) 08/01/2020   Aortic atherosclerosis (HCC) 07/10/2020   Hypercoagulable state due to atrial fibrillation (HCC) 04/23/2020   Atrial flutter (HCC) 03/29/2020   Chronic pain of both knees 03/13/2020   Chills (without fever) 12/12/2019   Other pancytopenia (HCC)    Decubitus ulcer of sacral region, stage 1 08/18/2019   HCAP (healthcare-associated pneumonia) 08/05/2019   Pressure injury of skin 07/24/2019   Chest congestion 07/24/2019   Itching 07/24/2019   Weakness 06/13/2019   Pneumonia due to COVID-19 virus 05/09/2019   Hypotension 05/09/2019   Symptomatic anemia 05/09/2019   CHF (congestive heart failure) (HCC) 05/09/2019   Swollen abdomen 01/04/2019   DDD (degenerative disc disease), cervical 12/06/2018   Neuropathy 12/06/2018   Paresthesia 10/11/2018   Neck pain 10/11/2018   Tobacco abuse 11/20/2017   Right leg swelling 11/20/2017   Right leg pain 11/20/2017   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   GERD (gastroesophageal reflux disease) 11/20/2017   Acute venous embolism and thrombosis of deep vessels of proximal lower extremity, right  (HCC) 11/20/2017   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    Thrush, oral    AKI (acute kidney injury) (HCC)    Multifocal pneumonia 08/09/2017   ETD (Eustachian tube dysfunction), bilateral 08/03/2017   Acute recurrent pansinusitis 07/21/2017   Chest pain 09/29/2015   Cervical stenosis of spinal canal 01/08/2015   Urinary tract infectious disease    Muscle spasms of neck 06/15/2014   Bleeding hemorrhoid 06/15/2014   Anemia in chronic kidney disease 06/15/2014   Pyelonephritis, acute 06/13/2014   Sepsis (HCC) 06/13/2014   History of kidney transplant    Abnormal CT scan 09/14/2012   Chronic pain syndrome 04/09/2012   Dehydration, mild 04/09/2012   Herpes infection 06/23/2011  Anxiety 06/23/2011   E coli bacteremia 06/18/2011   ESRD (end stage renal disease) (HCC) 06/12/2011   UTI (lower urinary tract infection) 06/12/2011   Bacteremia due to Gram-negative bacteria 05/23/2011   History of renal transplantation 05/22/2011   Septic shock(785.52) 05/22/2011   Acute on chronic kidney failure (HCC) 05/22/2011   Gastroparesis 04/24/2008   WEIGHT LOSS 08/24/2007   NAUSEA AND VOMITING 08/24/2007    PCP: Ivonne Andrew, NP  REFERRING PROVIDER: Unknown Jim, MD  REFERRING DIAG: Bil adhesive capsulitis   THERAPY DIAG:  Chronic pain of both shoulders  Muscle weakness (generalized)  Rationale for Evaluation and Treatment: Rehabilitation  ONSET DATE: Chronic, ongoing for years   SUBJECTIVE:                                                                                                                                                                                     SUBJECTIVE STATEMENT: Patient states her shoulders got better after he last PT but then they started getting worse again. She states on of her friends pressed on some pressure points and popped them which helped her pain. She reports pain with sleeping on the left  side will cause more pain on the right arm. Anything that requires constant movement, reaching overhead or trying to do her hair will increase her pain. She has trouble getting dressed or reaching into overhead cabinet. She also notes her right arm has been tingling.   PERTINENT HISTORY: History of stroke, DVT, CHF, Afib, Neuropathy, neck pain with ACDF C3-5 in 2016, gout, DDD, ESRD, cervical stenosis, dialysis, kidney transplant (failed), see complex medical history above  PAIN:  Are you having pain? Yes:  NPRS scale: 6/10 Pain location: Bilateral shoulders, R > L Pain description: constant aching Aggravating factors: Cooking, washing dishes, grooming, reaching into cabinet Relieving factors: Rest  PRECAUTIONS: see pertinent medical history  WEIGHT BEARING RESTRICTIONS: No  FALLS:  Has patient fallen in last 6 months? No  LIVING ENVIRONMENT: Lives with: lives alone Lives in: House/apartment Stairs: Yes: External: 6 steps; bilateral but cannot reach both  PLOF: Independent with basic ADLs  PATIENT GOALS: Improve shoulder pain and ability to reach overhead, put hair in ponytail   OBJECTIVE:  PATIENT SURVEYS:  FOTO 56 % functional status  COGNITION: Overall cognitive status: Within functional limits for tasks assessed     SENSATION: Appears intact  POSTURE: Rounded shoulder and forward head posture  UPPER EXTREMITY ROM:   ROM Right eval Left eval  Shoulder flexion 90 100  Shoulder extension    Shoulder abduction 65 90  Shoulder adduction    Shoulder internal rotation Reach to lateral iliac crest  Reach to PSIS  Shoulder external rotation Unable to reach to head Reach to occiput  Elbow flexion    Elbow extension    Wrist flexion    Wrist extension    Wrist ulnar deviation    Wrist radial deviation    Wrist pronation    Wrist supination    (Blank rows = not tested)  UPPER EXTREMITY MMT:    Strength assessment within available range  MMT Right eval  Left eval  Shoulder flexion 3 3  Shoulder extension    Shoulder abduction 3 3  Shoulder adduction    Shoulder internal rotation    Shoulder external rotation    Middle trapezius    Lower trapezius    Elbow flexion    Elbow extension    Wrist flexion    Wrist extension    Wrist ulnar deviation    Wrist radial deviation    Wrist pronation    Wrist supination    Grip strength (lbs)    (Blank rows = not tested)  JOINT MOBILITY TESTING:  Not assessed  PALPATION:  Generalized tenderness of bilateral shoulders    TODAY'S TREATMENT:      [facility] Adult PT Treatment:                                                DATE: 07/06/2022 Therapeutic Exercise: Seated table slide/walk 5 x 10 sec Overhead pulley x 4 min Supine dowel press x 10 Modalities: MHP applied to bilateral shoulders in seated position x 5 min  PATIENT EDUCATION: Education details: Exam findings, POC, HEP Person educated: Patient Education method: Explanation, Demonstration, Tactile cues, Verbal cues, and Handouts Education comprehension: verbalized understanding, returned demonstration, verbal cues required, tactile cues required, and needs further education  HOME EXERCISE PROGRAM: Access Code: [email or web address]    ASSESSMENT: CLINICAL IMPRESSION: Patient is a 57 y.o. female who was seen today for physical therapy evaluation and treatment for chronic shoulder pain, stiffness, and weakness. She demonstrates gross limitations in active shoulder motion and strength that is impacting her ability to perform self care and household tasks.    OBJECTIVE IMPAIRMENTS: decreased activity tolerance, decreased ROM, decreased strength, postural dysfunction, and pain.   ACTIVITY LIMITATIONS: carrying, lifting, bathing, dressing, reach over head, and hygiene/grooming  PARTICIPATION LIMITATIONS: meal prep, cleaning, laundry, shopping, and community activity  PERSONAL FACTORS: Fitness, Past/current experiences, Social background, Time  since onset of injury/illness/exacerbation, and 3+ comorbidities: see pertinent medical history above  are also affecting patient's functional outcome.   REHAB POTENTIAL: Fair; chronic, recurring, complex medical history   CLINICAL DECISION MAKING: Stable/uncomplicated  EVALUATION COMPLEXITY: Low   GOALS: Goals reviewed with patient? Yes  SHORT TERM GOALS: Target date: 08/03/2022  Patient will be I with initial HEP in order to progress with therapy. Baseline: HEP provided at eval Goal status: INITIAL  2.  Patient will report bilateral shoulder pain </= 3/10 in order to reduce functional limitations Baseline: patient reports shoulder pain 6/10 Goal status: INITIAL  LONG TERM GOALS: Target date: 08/31/2022  Patient will be I with final HEP to maintain progress from PT. Baseline: HEP provided at eval Goal status: INITIAL  2.  Patient will report >/= 63% status on FOTO to indicate improved functional ability. Baseline: 56% functional status Goal status: INITIAL  3.  Patient will demonstrate bilateral shoulder AROM flexion >/= 110 deg in  order to improve reach into low shelf Baseline: shoulder AROM flexion 90 deg Goal status: INITIAL  4.  Patient will be able to reach to occiput with right arm to demonstrate improved functional ER and ability to put her hair in a ponytail Baseline: patient unable to perform due to pain and limited motion Goal status: INITIAL   PLAN: PT FREQUENCY: 1x/week  PT DURATION: 8 weeks  PLANNED INTERVENTIONS: Therapeutic exercises, Therapeutic activity, Neuromuscular re-education, Balance training, Gait training, Patient/Family education, Self Care, Joint mobilization, Aquatic Therapy, Dry Needling, Cryotherapy, Moist heat, Manual therapy, and Re-evaluation  PLAN FOR NEXT SESSION: Review HEP and progress PRN, manual/stretching for bilateral shoulders as tolerated, progressive strengthening and ROM exercises   [name], PT, DPT, LAT,  ATC 07/06/22  9:30 AM Phone: [phone] Fax: [phone]

## 2022-07-06 ENCOUNTER — Other Ambulatory Visit: Payer: Self-pay

## 2022-07-06 ENCOUNTER — Ambulatory Visit: Payer: Medicare Other | Attending: Internal Medicine | Admitting: Physical Therapy

## 2022-07-06 ENCOUNTER — Encounter: Payer: Self-pay | Admitting: Physical Therapy

## 2022-07-06 DIAGNOSIS — M25511 Pain in right shoulder: Secondary | ICD-10-CM | POA: Insufficient documentation

## 2022-07-06 DIAGNOSIS — M6281 Muscle weakness (generalized): Secondary | ICD-10-CM | POA: Diagnosis present

## 2022-07-06 DIAGNOSIS — M25512 Pain in left shoulder: Secondary | ICD-10-CM | POA: Insufficient documentation

## 2022-07-06 DIAGNOSIS — G8929 Other chronic pain: Secondary | ICD-10-CM | POA: Diagnosis present

## 2022-07-06 NOTE — Patient Instructions (Signed)
Access Code: [facility] URL: [email or web address] Date: 07/06/2022 Prepared by: [name]  Exercises - Seated Shoulder Flexion Towel Slide at Table Top  - 3 x daily - 10 reps - 10 seconds hold - Seated Shoulder Flexion AAROM with Pulley Behind  - 3 x daily - 5 minutes hold - Supine Shoulder Press with Dowel  - 3 x daily - 10 reps

## 2022-07-15 ENCOUNTER — Ambulatory Visit: Payer: Self-pay | Admitting: Nurse Practitioner

## 2022-07-20 ENCOUNTER — Ambulatory Visit: Payer: Medicaid Other | Admitting: Internal Medicine

## 2022-07-21 ENCOUNTER — Other Ambulatory Visit: Payer: Self-pay | Admitting: Acute Care

## 2022-07-21 DIAGNOSIS — Z87891 Personal history of nicotine dependence: Secondary | ICD-10-CM

## 2022-07-21 DIAGNOSIS — Z122 Encounter for screening for malignant neoplasm of respiratory organs: Secondary | ICD-10-CM

## 2022-07-21 DIAGNOSIS — F17210 Nicotine dependence, cigarettes, uncomplicated: Secondary | ICD-10-CM

## 2022-07-21 NOTE — Therapy (Signed)
OUTPATIENT PHYSICAL THERAPY TREATMENT NOTE   Patient Name: Tina Watkins MRN: [id number] DOB:Dec 30, 1965, 57 y.o., female Today's Date: 07/22/2022   END OF SESSION:  PT End of Session - 07/22/22 0831     Visit Number 2    Number of Visits 9    Date for PT Re-Evaluation 08/31/22    Authorization Type MCR / MCD    Progress Note Due on Visit 10    PT Start Time 0830    PT Stop Time 0910    PT Time Calculation (min) 40 min    Activity Tolerance Patient tolerated treatment well    Behavior During Therapy [facility] for tasks assessed/performed              Past Medical History:  Diagnosis Date   Bacteremia due to Gram-negative bacteria 05/23/2011   Blind    right eye   Blind right eye    CHF (congestive heart failure) (HCC)    Chronic lower back pain    Complication of anesthesia    "woke up during OR; I have an extremely high tolerance" (12/11/2011) 1 procedure was graft; the other procedures were procedures that are typically done with sedation.   DDD (degenerative disc disease), cervical    Depression    Dysrhythmia    "tachycardia" (12/11/2011) new onset afib 10/15/14 EKG   E coli bacteremia 06/18/2011   Elevated LDL cholesterol level 12/2018   ESRD (end stage renal disease) (HCC) 06/12/2011   Fibromyalgia    Gastroparesis    Gastroparesis    GERD (gastroesophageal reflux disease)    Glaucoma    right eye   Gout    H/O carpal tunnel syndrome    Headache 10/2019   Headache(784.0)    "not often anymore" (12/11/2011)   Herpes genitalia 1994   History of blood transfusion    "more than a few times" (12/11/2011)   History of stomach ulcers    Hypotension    Iron deficiency anemia    New onset a-fib (HCC)    10/15/14 EKG   Osteopenia    Peripheral neuropathy 11/2018   Pressure ulcer of sacral region, stage 1 07/2019   Seizures (HCC) 1994   "post transplant; only have had that one" (12/11/2011)   Spinal stenosis in cervical region    Stroke [facility])     left basal  ganglia lacunar infarct; Right frontal lobe lacunar infarct.   Stroke [facility]) ~ 1999; 2001   "briefly lost my vision; lost my right eye" (12/11/2011)   Vitamin D deficiency 12/2018   Past Surgical History:  Procedure Laterality Date   ANTERIOR CERVICAL DECOMP/DISCECTOMY FUSION N/A 01/08/2015   Procedure: Anterior Cervical Three-Four/Four-Five Decompression/Diskectomy/Fusion;  Surgeon: Hilda Lias, MD;  Location: [facility] NEURO OR[facility];  Service: Neurosurgery;  Laterality: N/A;  C3-4 C4-5 Anterior cervical decompression/diskectomy/fusion   APPENDECTOMY  ~ 2004   BIOPSY  09/12/2020   Procedure: BIOPSY;  Surgeon: Rachael Fee, MD;  Location: [facility] ENDOSCOPY;  Service: Endoscopy;;   CATARACT EXTRACTION     right eye   COLONOSCOPY     COLONOSCOPY WITH PROPOFOL N/A 09/12/2020   Procedure: COLONOSCOPY WITH PROPOFOL;  Surgeon: Rachael Fee, MD;  Location: [facility] ENDOSCOPY;  Service: Endoscopy;  Laterality: N/A;   ENUCLEATION  2001   "right"   ESOPHAGOGASTRODUODENOSCOPY (EGD) WITH PROPOFOL N/A 04/21/2012   Procedure: ESOPHAGOGASTRODUODENOSCOPY (EGD) WITH PROPOFOL;  Surgeon: Rachael Fee, MD;  Location: [facility] ENDOSCOPY;  Service: Endoscopy;  Laterality: N/A;   ESOPHAGOGASTRODUODENOSCOPY (EGD) WITH PROPOFOL  N/A 09/12/2020   Procedure: ESOPHAGOGASTRODUODENOSCOPY (EGD) WITH PROPOFOL;  Surgeon: Rachael Fee, MD;  Location: [facility] ENDOSCOPY;  Service: Endoscopy;  Laterality: N/A;   INSERTION OF DIALYSIS CATHETER  1988   "AV graft LUA & LFA; LUA worked for 1 day; LFA never worked"   IR FLUORO GUIDE CV LINE RIGHT  07/11/2019   IR FLUORO GUIDE CV LINE RIGHT  10/20/2019   IR FLUORO GUIDE CV LINE RIGHT  05/31/2020   IR PTA VENOUS EXCEPT DIALYSIS CIRCUIT  05/31/2020   IR RADIOLOGY PERIPHERAL GUIDED IV START  07/11/2019   IR US GUIDE VASC ACCESS RIGHT  07/11/2019   IR US GUIDE VASC ACCESS RIGHT  07/11/2019   IR US GUIDE VASC ACCESS RIGHT  07/11/2019   IR VENOCAVAGRAM IVC  05/31/2020   KIDNEY TRANSPLANT  1994; 1999; 2005   "right"    MULTIPLE TOOTH EXTRACTIONS     POLYPECTOMY  09/12/2020   Procedure: POLYPECTOMY;  Surgeon: Rachael Fee, MD;  Location: [facility] ENDOSCOPY;  Service: Endoscopy;;   RIGHT HEART CATH N/A 03/23/2019   Procedure: RIGHT HEART CATH;  Surgeon: Dolores Patty, MD;  Location: [facility] INV[facility];  Service: Cardiovascular;  Laterality: N/A;   TONSILLECTOMY     TOTAL NEPHRECTOMY  1988?; 1994; 2005   Patient Active Problem List   Diagnosis Date Noted   Encounter for monitoring amiodarone therapy 07/01/2022   Atrial fibrillation (HCC) 07/01/2022   Bilateral hip pain 01/14/2022   Prolonged Q-T interval on ECG 09/07/2021   Nausea & vomiting 09/06/2021   Bilateral shoulder pain 06/06/2021   Bilateral hand swelling 06/06/2021   Cervical radiculitis 01/24/2021   Acute respiratory failure with hypoxia (HCC) 10/23/2020   Coagulation defect, unspecified (HCC) 08/01/2020   Aortic atherosclerosis (HCC) 07/10/2020   Hypercoagulable state due to atrial fibrillation (HCC) 04/23/2020   Atrial flutter (HCC) 03/29/2020   Chronic pain of both knees 03/13/2020   Chills (without fever) 12/12/2019   Other pancytopenia (HCC)    Decubitus ulcer of sacral region, stage 1 08/18/2019   HCAP (healthcare-associated pneumonia) 08/05/2019   Pressure injury of skin 07/24/2019   Chest congestion 07/24/2019   Itching 07/24/2019   Weakness 06/13/2019   Pneumonia due to COVID-19 virus 05/09/2019   Hypotension 05/09/2019   Symptomatic anemia 05/09/2019   CHF (congestive heart failure) (HCC) 05/09/2019   Swollen abdomen 01/04/2019   DDD (degenerative disc disease), cervical 12/06/2018   Neuropathy 12/06/2018   Paresthesia 10/11/2018   Neck pain 10/11/2018   Tobacco abuse 11/20/2017   Right leg swelling 11/20/2017   Right leg pain 11/20/2017   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   GERD (gastroesophageal reflux disease) 11/20/2017   Acute venous embolism and thrombosis of deep vessels of proximal lower extremity, right  (HCC) 11/20/2017   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    Thrush, oral    AKI (acute kidney injury) (HCC)    Multifocal pneumonia 08/09/2017   ETD (Eustachian tube dysfunction), bilateral 08/03/2017   Acute recurrent pansinusitis 07/21/2017   Chest pain 09/29/2015   Cervical stenosis of spinal canal 01/08/2015   Urinary tract infectious disease    Muscle spasms of neck 06/15/2014   Bleeding hemorrhoid 06/15/2014   Anemia in chronic kidney disease 06/15/2014   Pyelonephritis, acute 06/13/2014   Sepsis (HCC) 06/13/2014   History of kidney transplant    Abnormal CT scan 09/14/2012   Chronic pain syndrome 04/09/2012   Dehydration, mild 04/09/2012   Herpes  infection 06/23/2011   Anxiety 06/23/2011   E coli bacteremia 06/18/2011   ESRD (end stage renal disease) (HCC) 06/12/2011   UTI (lower urinary tract infection) 06/12/2011   Bacteremia due to Gram-negative bacteria 05/23/2011   History of renal transplantation 05/22/2011   Septic shock(785.52) 05/22/2011   Acute on chronic kidney failure (HCC) 05/22/2011   Gastroparesis 04/24/2008   WEIGHT LOSS 08/24/2007   NAUSEA AND VOMITING 08/24/2007    PCP: Ivonne Andrew, NP  REFERRING PROVIDER: Unknown Jim, MD  REFERRING DIAG: Bil adhesive capsulitis   THERAPY DIAG:  Chronic pain of both shoulders  Muscle weakness (generalized)  Rationale for Evaluation and Treatment: Rehabilitation  ONSET DATE: Chronic, ongoing for years   SUBJECTIVE:                                                                                                                                                                                     SUBJECTIVE STATEMENT: Patient reports that her shoulder pain continues, states that the Rt side tends to be worse.    PERTINENT HISTORY: History of stroke, DVT, CHF, Afib, Neuropathy, neck pain with ACDF C3-5 in 2016, gout, DDD, ESRD, cervical stenosis, dialysis,  kidney transplant (failed), see complex medical history above  PAIN:  Are you having pain? Yes:  NPRS scale: 6/10 Pain location: Bilateral shoulders, R > L Pain description: constant aching Aggravating factors: Cooking, washing dishes, grooming, reaching into cabinet Relieving factors: Rest  PRECAUTIONS: see pertinent medical history  WEIGHT BEARING RESTRICTIONS: No  FALLS:  Has patient fallen in last 6 months? No  LIVING ENVIRONMENT: Lives with: lives alone Lives in: House/apartment Stairs: Yes: External: 6 steps; bilateral but cannot reach both  PLOF: Independent with basic ADLs  PATIENT GOALS: Improve shoulder pain and ability to reach overhead, put hair in ponytail   OBJECTIVE:  PATIENT SURVEYS:  FOTO 56 % functional status  COGNITION: Overall cognitive status: Within functional limits for tasks assessed     SENSATION: Appears intact  POSTURE: Rounded shoulder and forward head posture  UPPER EXTREMITY ROM:   ROM Right eval Left eval  Shoulder flexion 90 100  Shoulder extension    Shoulder abduction 65 90  Shoulder adduction    Shoulder internal rotation Reach to lateral iliac crest Reach to PSIS  Shoulder external rotation Unable to reach to head Reach to occiput  Elbow flexion    Elbow extension    Wrist flexion    Wrist extension    Wrist ulnar deviation    Wrist radial deviation    Wrist pronation    Wrist supination    (Blank rows = not tested)  UPPER EXTREMITY MMT:  Strength assessment within available range  MMT Right eval Left eval  Shoulder flexion 3 3  Shoulder extension    Shoulder abduction 3 3  Shoulder adduction    Shoulder internal rotation    Shoulder external rotation    Middle trapezius    Lower trapezius    Elbow flexion    Elbow extension    Wrist flexion    Wrist extension    Wrist ulnar deviation    Wrist radial deviation    Wrist pronation    Wrist supination    Grip strength (lbs)    (Blank rows = not  tested)  JOINT MOBILITY TESTING:  Not assessed  PALPATION:  Generalized tenderness of bilateral shoulders    TODAY'S TREATMENT:      [facility] Adult PT Treatment:                                                DATE: 07/21/22 Therapeutic Exercise: Nustep level 5 x 8 mins - 328 steps Low row - 2x10 - GTB Shoulder ext - RTB - 2x10 Shoulder ER/IR YTB 10x each Reclined on wedge with pillows: Chest press - dowel only 2x10 Shoulder flexion dowel x10 - small range (difficult) Elbow flexion/extension - 1# 2x10 BIL   [facility] Adult PT Treatment:                                                DATE: 07/06/2022 Therapeutic Exercise: Seated table slide/walk 5 x 10 sec Overhead pulley x 4 min Supine dowel press x 10 Modalities: MHP applied to bilateral shoulders in seated position x 5 min  PATIENT EDUCATION: Education details: Exam findings, POC, HEP Person educated: Patient Education method: Explanation, Demonstration, Tactile cues, Verbal cues, and Handouts Education comprehension: verbalized understanding, returned demonstration, verbal cues required, tactile cues required, and needs further education  HOME EXERCISE PROGRAM: Access Code: [email or web address]    ASSESSMENT: CLINICAL IMPRESSION: Patient presents to first follow up PT session reporting continued BIL shoulder pain. Session today focused on RTC and periscapular strengthening as well as shoulder ROM. She is somewhat limited by pain and fatigue throughout session, needing frequent rest breaks. She had the most difficulty with shoulder flexion with the dowel due to pain and weakness, limiting ROM. Patient continues to benefit from skilled PT services and should be progressed as able to improve functional independence.    OBJECTIVE IMPAIRMENTS: decreased activity tolerance, decreased ROM, decreased strength, postural dysfunction, and pain.   ACTIVITY LIMITATIONS: carrying, lifting, bathing, dressing, reach over head, and  hygiene/grooming  PARTICIPATION LIMITATIONS: meal prep, cleaning, laundry, shopping, and community activity  PERSONAL FACTORS: Fitness, Past/current experiences, Social background, Time since onset of injury/illness/exacerbation, and 3+ comorbidities: see pertinent medical history above  are also affecting patient's functional outcome.   REHAB POTENTIAL: Fair; chronic, recurring, complex medical history   CLINICAL DECISION MAKING: Stable/uncomplicated  EVALUATION COMPLEXITY: Low   GOALS: Goals reviewed with patient? Yes  SHORT TERM GOALS: Target date: 08/03/2022  Patient will be I with initial HEP in order to progress with therapy. Baseline: HEP provided at eval Goal status: INITIAL  2.  Patient will report bilateral shoulder pain </= 3/10 in order to reduce functional limitations Baseline: patient reports shoulder pain  6/10 Goal status: INITIAL  LONG TERM GOALS: Target date: 08/31/2022  Patient will be I with final HEP to maintain progress from PT. Baseline: HEP provided at eval Goal status: INITIAL  2.  Patient will report >/= 63% status on FOTO to indicate improved functional ability. Baseline: 56% functional status Goal status: INITIAL  3.  Patient will demonstrate bilateral shoulder AROM flexion >/= 110 deg in order to improve reach into low shelf Baseline: shoulder AROM flexion 90 deg Goal status: INITIAL  4.  Patient will be able to reach to occiput with right arm to demonstrate improved functional ER and ability to put her hair in a ponytail Baseline: patient unable to perform due to pain and limited motion Goal status: INITIAL   PLAN: PT FREQUENCY: 1x/week  PT DURATION: 8 weeks  PLANNED INTERVENTIONS: Therapeutic exercises, Therapeutic activity, Neuromuscular re-education, Balance training, Gait training, Patient/Family education, Self Care, Joint mobilization, Aquatic Therapy, Dry Needling, Cryotherapy, Moist heat, Manual therapy, and Re-evaluation  PLAN FOR  NEXT SESSION: Review HEP and progress PRN, manual/stretching for bilateral shoulders as tolerated, progressive strengthening and ROM exercises   Berta Minor PTA 07/22/22  9:15 AM Phone: [phone] Fax: [phone]

## 2022-07-22 ENCOUNTER — Ambulatory Visit
Admission: RE | Admit: 2022-07-22 | Discharge: 2022-07-22 | Disposition: A | Discharge status: Home / Self Care | Payer: Medicare Other | Source: Ambulatory Visit | Attending: Nurse Practitioner | Admitting: Nurse Practitioner

## 2022-07-22 ENCOUNTER — Ambulatory Visit: Payer: Medicare Other

## 2022-07-22 DIAGNOSIS — G8929 Other chronic pain: Secondary | ICD-10-CM

## 2022-07-22 DIAGNOSIS — M25511 Pain in right shoulder: Secondary | ICD-10-CM | POA: Diagnosis not present

## 2022-07-22 DIAGNOSIS — M6281 Muscle weakness (generalized): Secondary | ICD-10-CM

## 2022-07-22 DIAGNOSIS — Z1231 Encounter for screening mammogram for malignant neoplasm of breast: Secondary | ICD-10-CM

## 2022-07-29 ENCOUNTER — Ambulatory Visit: Payer: Medicare Other

## 2022-08-05 ENCOUNTER — Ambulatory Visit: Payer: Medicare Other

## 2022-08-11 NOTE — Therapy (Signed)
OUTPATIENT PHYSICAL THERAPY TREATMENT NOTE   Patient Name: Tina Watkins MRN: [id number] DOB:[birth year], 57 y.o., female Today's Date: 08/11/2022   END OF SESSION:     Past Medical History:  Diagnosis Date   Bacteremia due to Gram-negative bacteria 05/23/2011   Blind    right eye   Blind right eye    CHF (congestive heart failure) (HCC)    Chronic lower back pain    Complication of anesthesia    "woke up during OR; I have an extremely high tolerance" (12/11/2011) 1 procedure was graft; the other procedures were procedures that are typically done with sedation.   DDD (degenerative disc disease), cervical    Depression    Dysrhythmia    "tachycardia" (12/11/2011) new onset afib 10/15/14 EKG   E coli bacteremia 06/18/2011   Elevated LDL cholesterol level 12/2018   ESRD (end stage renal disease) (HCC) 06/12/2011   Fibromyalgia    Gastroparesis    Gastroparesis    GERD (gastroesophageal reflux disease)    Glaucoma    right eye   Gout    H/O carpal tunnel syndrome    Headache 10/2019   Headache(784.0)    "not often anymore" (12/11/2011)   Herpes genitalia 1994   History of blood transfusion    "more than a few times" (12/11/2011)   History of stomach ulcers    Hypotension    Iron deficiency anemia    New onset a-fib (HCC)    10/15/14 EKG   Osteopenia    Peripheral neuropathy 11/2018   Pressure ulcer of sacral region, stage 1 07/2019   Seizures (HCC) 1994   "post transplant; only have had that one" (12/11/2011)   Spinal stenosis in cervical region    Stroke [facility])     left basal ganglia lacunar infarct; Right frontal lobe lacunar infarct.   Stroke [facility]) ~ 1999; 2001   "briefly lost my vision; lost my right eye" (12/11/2011)   Vitamin D deficiency 12/2018   Past Surgical History:  Procedure Laterality Date   ANTERIOR CERVICAL DECOMP/DISCECTOMY FUSION N/A 01/08/2015   Procedure: Anterior Cervical Three-Four/Four-Five Decompression/Diskectomy/Fusion;  Surgeon: Hilda Lias, MD;  Location: [facility] NEURO [facility];  Service: Neurosurgery;  Laterality: N/A;  C3-4 C4-5 Anterior cervical decompression/diskectomy/fusion   APPENDECTOMY  ~ 2004   BIOPSY  09/12/2020   Procedure: BIOPSY;  Surgeon: Rachael Fee, MD;  Location: [facility] ENDOSCOPY;  Service: Endoscopy;;   CATARACT EXTRACTION     right eye   COLONOSCOPY     COLONOSCOPY WITH PROPOFOL N/A 09/12/2020   Procedure: COLONOSCOPY WITH PROPOFOL;  Surgeon: Rachael Fee, MD;  Location: [facility] ENDOSCOPY;  Service: Endoscopy;  Laterality: N/A;   ENUCLEATION  2001   "right"   ESOPHAGOGASTRODUODENOSCOPY (EGD) WITH PROPOFOL N/A 04/21/2012   Procedure: ESOPHAGOGASTRODUODENOSCOPY (EGD) WITH PROPOFOL;  Surgeon: Rachael Fee, MD;  Location: [facility] ENDOSCOPY;  Service: Endoscopy;  Laterality: N/A;   ESOPHAGOGASTRODUODENOSCOPY (EGD) WITH PROPOFOL N/A 09/12/2020   Procedure: ESOPHAGOGASTRODUODENOSCOPY (EGD) WITH PROPOFOL;  Surgeon: Rachael Fee, MD;  Location: [facility] ENDOS[facility];  Service: Endoscopy;  Laterality: N/A;   INSERTION OF DIALYSIS CATHETER  1988   "AV graft LUA & LFA; LUA worked for 1 day; LFA never worked"   IR FLUORO GUIDE CV LINE RIGHT  07/11/2019   IR FLUORO GUIDE CV LINE RIGHT  10/20/2019   IR FLUORO GUIDE CV LINE RIGHT  05/31/2020   IR PTA VENOUS EXCEPT DIALYSIS CIRCUIT  05/31/2020   IR RADIOLOGY PERIPHERAL GUIDED IV START  07/11/2019  IR US GUIDE VASC ACCESS RIGHT  07/11/2019   IR US GUIDE VASC ACCESS RIGHT  07/11/2019   IR US GUIDE VASC ACCESS RIGHT  07/11/2019   IR VENOCAVAGRAM IVC  05/31/2020   KIDNEY TRANSPLANT  1994; 1999; 2005   "right"   MULTIPLE TOOTH EXTRACTIONS     POLYPECTOMY  09/12/2020   Procedure: POLYPECTOMY;  Surgeon: Rachael Fee, MD;  Location: [facility] ENDOS[facility];  Service: Endoscopy;;   RIGHT HEART CATH N/A 03/23/2019   Procedure: RIGHT HEART CATH;  Surgeon: Dolores Patty, MD;  Location: [facility] INV[facility];  Service: Cardiovascular;  Laterality: N/A;   TONSILLECTOMY     TOTAL NEPHRECTOMY  1988?; 1994; 2005    Patient Active Problem List   Diagnosis Date Noted   Encounter for monitoring amiodarone therapy 07/01/2022   Atrial fibrillation (HCC) 07/01/2022   Bilateral hip pain 01/14/2022   Prolonged Q-T interval on ECG 09/07/2021   Nausea & vomiting 09/06/2021   Bilateral shoulder pain 06/06/2021   Bilateral hand swelling 06/06/2021   Cervical radiculitis 01/24/2021   Acute respiratory failure with hypoxia (HCC) 10/23/2020   Coagulation defect, unspecified (HCC) 08/01/2020   Aortic atherosclerosis (HCC) 07/10/2020   Hypercoagulable state due to atrial fibrillation (HCC) 04/23/2020   Atrial flutter (HCC) 03/29/2020   Chronic pain of both knees 03/13/2020   Chills (without fever) 12/12/2019   Other pancytopenia (HCC)    Decubitus ulcer of sacral region, stage 1 08/18/2019   HCAP (healthcare-associated pneumonia) 08/05/2019   Pressure injury of skin 07/24/2019   Chest congestion 07/24/2019   Itching 07/24/2019   Weakness 06/13/2019   Pneumonia due to COVID-19 virus 05/09/2019   Hypotension 05/09/2019   Symptomatic anemia 05/09/2019   CHF (congestive heart failure) (HCC) 05/09/2019   Swollen abdomen 01/04/2019   DDD (degenerative disc disease), cervical 12/06/2018   Neuropathy 12/06/2018   Paresthesia 10/11/2018   Neck pain 10/11/2018   Tobacco abuse 11/20/2017   Right leg swelling 11/20/2017   Right leg pain 11/20/2017   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   GERD (gastroesophageal reflux disease) 11/20/2017   Acute venous embolism and thrombosis of deep vessels of proximal lower extremity, right (HCC) 11/20/2017   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    Thrush, oral    AKI (acute kidney injury) (HCC)    Multifocal pneumonia 08/09/2017   ETD (Eustachian tube dysfunction), bilateral 08/03/2017   Acute recurrent pansinusitis 07/21/2017   Chest pain 09/29/2015   Cervical stenosis of spinal canal 01/08/2015   Urinary tract infectious disease     Muscle spasms of neck 06/15/2014   Bleeding hemorrhoid 06/15/2014   Anemia in chronic kidney disease 06/15/2014   Pyelonephritis, acute 06/13/2014   Sepsis (HCC) 06/13/2014   History of kidney transplant    Abnormal CT scan 09/14/2012   Chronic pain syndrome 04/09/2012   Dehydration, mild 04/09/2012   Herpes infection 06/23/2011   Anxiety 06/23/2011   E coli bacteremia 06/18/2011   ESRD (end stage renal disease) (HCC) 06/12/2011   UTI (lower urinary tract infection) 06/12/2011   Bacteremia due to Gram-negative bacteria 05/23/2011   History of renal transplantation 05/22/2011   Septic shock(785.52) 05/22/2011   Acute on chronic kidney failure (HCC) 05/22/2011   Gastroparesis 04/24/2008   WEIGHT LOSS 08/24/2007   NAUSEA AND VOMITING 08/24/2007    PCP: Ivonne Andrew, NP  REFERRING PROVIDER: Unknown Jim, MD  REFERRING DIAG: Bil adhesive capsulitis   THERAPY DIAG:  No  diagnosis found.  Rationale for Evaluation and Treatment: Rehabilitation  ONSET DATE: Chronic, ongoing for years   SUBJECTIVE:                                                                                                                                                                                     SUBJECTIVE STATEMENT: Patient reports that her shoulder pain continues, states that the Rt side tends to be worse.   PAIN:  Are you having pain? Yes:  NPRS scale: 6/10 Pain location: Bilateral shoulders, R > L Pain description: constant aching Aggravating factors: Cooking, washing dishes, grooming, reaching into cabinet Relieving factors: Rest  PERTINENT HISTORY: History of stroke, DVT, CHF, Afib, Neuropathy, neck pain with ACDF C3-5 in 2016, gout, DDD, ESRD, cervical stenosis, dialysis, kidney transplant (failed), see complex medical history above  PRECAUTIONS: see pertinent medical history  WEIGHT BEARING RESTRICTIONS: No  PATIENT GOALS: Improve shoulder pain and ability to reach  overhead, put hair in ponytail   OBJECTIVE:  PATIENT SURVEYS:  FOTO 56 % functional status  POSTURE: Rounded shoulder and forward head posture  UPPER EXTREMITY ROM:   ROM Right eval Left eval  Shoulder flexion 90 100  Shoulder extension    Shoulder abduction 65 90  Shoulder adduction    Shoulder internal rotation Reach to lateral iliac crest Reach to PSIS  Shoulder external rotation Unable to reach to head Reach to occiput  Elbow flexion    Elbow extension    Wrist flexion    Wrist extension    Wrist ulnar deviation    Wrist radial deviation    Wrist pronation    Wrist supination    (Blank rows = not tested)  UPPER EXTREMITY MMT:    Strength assessment within available range  MMT Right eval Left eval  Shoulder flexion 3 3  Shoulder extension    Shoulder abduction 3 3  Shoulder adduction    Shoulder internal rotation    Shoulder external rotation    Middle trapezius    Lower trapezius    Elbow flexion    Elbow extension    Wrist flexion    Wrist extension    Wrist ulnar deviation    Wrist radial deviation    Wrist pronation    Wrist supination    Grip strength (lbs)    (Blank rows = not tested)  JOINT MOBILITY TESTING:  Not assessed  PALPATION:  Generalized tenderness of bilateral shoulders    TODAY'S TREATMENT:      [facility] Adult PT Treatment:  DATE: 08/12/22 Therapeutic Exercise: Nustep level 5 x 8 mins - 328 steps Low row - 2x10 - GTB Shoulder ext - RTB - 2x10 Shoulder ER/IR YTB 10x each Reclined on wedge with pillows: Chest press - dowel only 2x10 Shoulder flexion dowel x10 - small range (difficult) Elbow flexion/extension - 1# 2x10 BIL   [facility] Adult PT Treatment:                                                DATE: 07/21/22 Therapeutic Exercise: Nustep level 5 x 8 mins - 328 steps Low row - 2x10 - GTB Shoulder ext - RTB - 2x10 Shoulder ER/IR YTB 10x each Reclined on wedge with  pillows: Chest press - dowel only 2x10 Shoulder flexion dowel x10 - small range (difficult) Elbow flexion/extension - 1# 2x10 BIL  [facility] Adult PT Treatment:                                                DATE: 07/06/2022 Therapeutic Exercise: Seated table slide/walk 5 x 10 sec Overhead pulley x 4 min Supine dowel press x 10 Modalities: MHP applied to bilateral shoulders in seated position x 5 min  PATIENT EDUCATION: Education details: Exam findings, POC, HEP Person educated: Patient Education method: Explanation, Demonstration, Tactile cues, Verbal cues, and Handouts Education comprehension: verbalized understanding, returned demonstration, verbal cues required, tactile cues required, and needs further education  HOME EXERCISE PROGRAM: Access Code: [email or web address]    ASSESSMENT: CLINICAL IMPRESSION: Patient with fair tolerance for therapy, no adverse effects reported. ***  Patient presents to first follow up PT session reporting continued BIL shoulder pain. Session today focused on RTC and periscapular strengthening as well as shoulder ROM. She is somewhat limited by pain and fatigue throughout session, needing frequent rest breaks. She had the most difficulty with shoulder flexion with the dowel due to pain and weakness, limiting ROM. Patient continues to benefit from skilled PT services and should be progressed as able to improve functional independence.    OBJECTIVE IMPAIRMENTS: decreased activity tolerance, decreased ROM, decreased strength, postural dysfunction, and pain.   ACTIVITY LIMITATIONS: carrying, lifting, bathing, dressing, reach over head, and hygiene/grooming  PARTICIPATION LIMITATIONS: meal prep, cleaning, laundry, shopping, and community activity  PERSONAL FACTORS: Fitness, Past/current experiences, Social background, Time since onset of injury/illness/exacerbation, and 3+ comorbidities: see pertinent medical history above  are also affecting patient's functional outcome.     GOALS: Goals reviewed with patient? Yes  SHORT TERM GOALS: Target date: 08/03/2022  Patient will be I with initial HEP in order to progress with therapy. Baseline: HEP provided at eval Goal status: INITIAL  2.  Patient will report bilateral shoulder pain </= 3/10 in order to reduce functional limitations Baseline: patient reports shoulder pain 6/10 Goal status: INITIAL  LONG TERM GOALS: Target date: 08/31/2022  Patient will be I with final HEP to maintain progress from PT. Baseline: HEP provided at eval Goal status: INITIAL  2.  Patient will report >/= 63% status on FOTO to indicate improved functional ability. Baseline: 56% functional status Goal status: INITIAL  3.  Patient will demonstrate bilateral shoulder AROM flexion >/= 110 deg in order to improve reach into low shelf Baseline: shoulder AROM flexion 90 deg Goal  status: INITIAL  4.  Patient will be able to reach to occiput with right arm to demonstrate improved functional ER and ability to put her hair in a ponytail Baseline: patient unable to perform due to pain and limited motion Goal status: INITIAL   PLAN: PT FREQUENCY: 1x/week  PT DURATION: 8 weeks  PLANNED INTERVENTIONS: Therapeutic exercises, Therapeutic activity, Neuromuscular re-education, Balance training, Gait training, Patient/Family education, Self Care, Joint mobilization, Aquatic Therapy, Dry Needling, Cryotherapy, Moist heat, Manual therapy, and Re-evaluation  PLAN FOR NEXT SESSION: Review HEP and progress PRN, manual/stretching for bilateral shoulders as tolerated, progressive strengthening and ROM exercises   Hilbert Bible PT 08/11/22  1:13 PM Phone: [phone] Fax: [phone]

## 2022-08-12 ENCOUNTER — Other Ambulatory Visit: Payer: Self-pay

## 2022-08-12 ENCOUNTER — Ambulatory Visit: Payer: Medicare Other | Attending: Family Medicine | Admitting: Physical Therapy

## 2022-08-12 ENCOUNTER — Encounter: Payer: Self-pay | Admitting: Physical Therapy

## 2022-08-12 DIAGNOSIS — M25512 Pain in left shoulder: Secondary | ICD-10-CM | POA: Insufficient documentation

## 2022-08-12 DIAGNOSIS — M25511 Pain in right shoulder: Secondary | ICD-10-CM | POA: Insufficient documentation

## 2022-08-12 DIAGNOSIS — M6281 Muscle weakness (generalized): Secondary | ICD-10-CM | POA: Insufficient documentation

## 2022-08-12 DIAGNOSIS — G8929 Other chronic pain: Secondary | ICD-10-CM | POA: Insufficient documentation

## 2022-08-17 ENCOUNTER — Encounter: Payer: Self-pay | Admitting: Physical Therapy

## 2022-08-17 ENCOUNTER — Other Ambulatory Visit: Payer: Self-pay

## 2022-08-17 ENCOUNTER — Ambulatory Visit: Payer: Medicare Other | Admitting: Physical Therapy

## 2022-08-17 DIAGNOSIS — M25511 Pain in right shoulder: Secondary | ICD-10-CM | POA: Diagnosis not present

## 2022-08-17 DIAGNOSIS — G8929 Other chronic pain: Secondary | ICD-10-CM

## 2022-08-17 DIAGNOSIS — M6281 Muscle weakness (generalized): Secondary | ICD-10-CM

## 2022-08-17 NOTE — Therapy (Signed)
OUTPATIENT PHYSICAL THERAPY TREATMENT NOTE   Patient Name: Tina Watkins MRN: [id number] DOB:Apr 29, 1965, 57 y.o., female Today's Date: 08/17/2022   END OF SESSION:  PT End of Session - 08/17/22 0851     Visit Number 4    Number of Visits 9    Date for PT Re-Evaluation 08/31/22    Authorization Type MCR / MCD    Progress Note Due on Visit 10    PT Start Time 0845    PT Stop Time 0935    PT Time Calculation (min) 50 min    Activity Tolerance Patient tolerated treatment well    Behavior During Therapy [facility] for tasks assessed/performed                Past Medical History:  Diagnosis Date   Bacteremia due to Gram-negative bacteria 05/23/2011   Blind    right eye   Blind right eye    CHF (congestive heart failure) (HCC)    Chronic lower back pain    Complication of anesthesia    "woke up during OR; I have an extremely high tolerance" (12/11/2011) 1 procedure was graft; the other procedures were procedures that are typically done with sedation.   DDD (degenerative disc disease), cervical    Depression    Dysrhythmia    "tachycardia" (12/11/2011) new onset afib 10/15/14 EKG   E coli bacteremia 06/18/2011   Elevated LDL cholesterol level 12/2018   ESRD (end stage renal disease) (HCC) 06/12/2011   Fibromyalgia    Gastroparesis    Gastroparesis    GERD (gastroesophageal reflux disease)    Glaucoma    right eye   Gout    H/O carpal tunnel syndrome    Headache 10/2019   Headache(784.0)    "not often anymore" (12/11/2011)   Herpes genitalia 1994   History of blood transfusion    "more than a few times" (12/11/2011)   History of stomach ulcers    Hypotension    Iron deficiency anemia    New onset a-fib (HCC)    10/15/14 EKG   Osteopenia    Peripheral neuropathy 11/2018   Pressure ulcer of sacral region, stage 1 07/2019   Seizures (HCC) 1994   "post transplant; only have had that one" (12/11/2011)   Spinal stenosis in cervical region    Stroke [facility])     left basal  ganglia lacunar infarct; Right frontal lobe lacunar infarct.   Stroke [facility]) ~ 1999; 2001   "briefly lost my vision; lost my right eye" (12/11/2011)   Vitamin D deficiency 12/2018   Past Surgical History:  Procedure Laterality Date   ANTERIOR CERVICAL DECOMP/DISCECTOMY FUSION N/A 01/08/2015   Procedure: Anterior Cervical Three-Four/Four-Five Decompression/Diskectomy/Fusion;  Surgeon: Hilda Lias, MD;  Location: [facility] NEURO ORS;  Service: Neurosurgery;  Laterality: N/A;  C3-4 C4-5 Anterior cervical decompression/diskectomy/fusion   APPENDECTOMY  ~ 2004   BIOPSY  09/12/2020   Procedure: BIOPSY;  Surgeon: Rachael Fee, MD;  Location: [facility] ENDOSCOPY;  Service: Endoscopy;;   CATARACT EXTRACTION     right eye   COLONOSCOPY     COLONOSCOPY WITH PROPOFOL N/A 09/12/2020   Procedure: COLONOSCOPY WITH PROPOFOL;  Surgeon: Rachael Fee, MD;  Location: [facility] ENDOSCOPY;  Service: Endoscopy;  Laterality: N/A;   ENUCLEATION  2001   "right"   ESOPHAGOGASTRODUODENOSCOPY (EGD) WITH PROPOFOL N/A 04/21/2012   Procedure: ESOPHAGOGASTRODUODENOSCOPY (EGD) WITH PROPOFOL;  Surgeon: Rachael Fee, MD;  Location: [facility] ENDOSCOPY;  Service: Endoscopy;  Laterality: N/A;   ESOPHAGOGASTRODUODENOSCOPY (EGD)  WITH PROPOFOL N/A 09/12/2020   Procedure: ESOPHAGOGASTRODUODENOSCOPY (EGD) WITH PROPOFOL;  Surgeon: Rachael Fee, MD;  Location: [facility] ENDOSCOPY;  Service: Endoscopy;  Laterality: N/A;   INSERTION OF DIALYSIS CATHETER  1988   "AV graft LUA & LFA; LUA worked for 1 day; LFA never worked"   IR FLUORO GUIDE CV LINE RIGHT  07/11/2019   IR FLUORO GUIDE CV LINE RIGHT  10/20/2019   IR FLUORO GUIDE CV LINE RIGHT  05/31/2020   IR PTA VENOUS EXCEPT DIALYSIS CIRCUIT  05/31/2020   IR RADIOLOGY PERIPHERAL GUIDED IV START  07/11/2019   IR US GUIDE VASC ACCESS RIGHT  07/11/2019   IR US GUIDE VASC ACCESS RIGHT  07/11/2019   IR US GUIDE VASC ACCESS RIGHT  07/11/2019   IR VENOCAVAGRAM IVC  05/31/2020   KIDNEY TRANSPLANT  1994; 1999; 2005   "right"    MULTIPLE TOOTH EXTRACTIONS     POLYPECTOMY  09/12/2020   Procedure: POLYPECTOMY;  Surgeon: Rachael Fee, MD;  Location: [facility] ENDOSCOPY;  Service: Endoscopy;;   RIGHT HEART CATH N/A 03/23/2019   Procedure: RIGHT HEART CATH;  Surgeon: Dolores Patty, MD;  Location: [facility] INVASIVE CV LAB;  Service: Cardiovascular;  Laterality: N/A;   TONSILLECTOMY     TOTAL NEPHRECTOMY  1988?; 1994; 2005   Patient Active Problem List   Diagnosis Date Noted   Encounter for monitoring amiodarone therapy 07/01/2022   Atrial fibrillation (HCC) 07/01/2022   Bilateral hip pain 01/14/2022   Prolonged Q-T interval on ECG 09/07/2021   Nausea & vomiting 09/06/2021   Bilateral shoulder pain 06/06/2021   Bilateral hand swelling 06/06/2021   Cervical radiculitis 01/24/2021   Acute respiratory failure with hypoxia (HCC) 10/23/2020   Coagulation defect, unspecified (HCC) 08/01/2020   Aortic atherosclerosis (HCC) 07/10/2020   Hypercoagulable state due to atrial fibrillation (HCC) 04/23/2020   Atrial flutter (HCC) 03/29/2020   Chronic pain of both knees 03/13/2020   Chills (without fever) 12/12/2019   Other pancytopenia (HCC)    Decubitus ulcer of sacral region, stage 1 08/18/2019   HCAP (healthcare-associated pneumonia) 08/05/2019   Pressure injury of skin 07/24/2019   Chest congestion 07/24/2019   Itching 07/24/2019   Weakness 06/13/2019   Pneumonia due to COVID-19 virus 05/09/2019   Hypotension 05/09/2019   Symptomatic anemia 05/09/2019   CHF (congestive heart failure) (HCC) 05/09/2019   Swollen abdomen 01/04/2019   DDD (degenerative disc disease), cervical 12/06/2018   Neuropathy 12/06/2018   Paresthesia 10/11/2018   Neck pain 10/11/2018   Tobacco abuse 11/20/2017   Right leg swelling 11/20/2017   Right leg pain 11/20/2017   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   GERD (gastroesophageal reflux disease) 11/20/2017   Acute venous embolism and thrombosis of deep vessels of proximal lower extremity, right  (HCC) 11/20/2017   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    Thrush, oral    AKI (acute kidney injury) (HCC)    Multifocal pneumonia 08/09/2017   ETD (Eustachian tube dysfunction), bilateral 08/03/2017   Acute recurrent pansinusitis 07/21/2017   Chest pain 09/29/2015   Cervical stenosis of spinal canal 01/08/2015   Urinary tract infectious disease    Muscle spasms of neck 06/15/2014   Bleeding hemorrhoid 06/15/2014   Anemia in chronic kidney disease 06/15/2014   Pyelonephritis, acute 06/13/2014   Sepsis (HCC) 06/13/2014   History of kidney transplant    Abnormal CT scan 09/14/2012   Chronic pain syndrome 04/09/2012   Dehydration, mild 04/09/2012  Herpes infection 06/23/2011   Anxiety 06/23/2011   E coli bacteremia 06/18/2011   ESRD (end stage renal disease) (HCC) 06/12/2011   UTI (lower urinary tract infection) 06/12/2011   Bacteremia due to Gram-negative bacteria 05/23/2011   History of renal transplantation 05/22/2011   Septic shock(785.52) 05/22/2011   Acute on chronic kidney failure (HCC) 05/22/2011   Gastroparesis 04/24/2008   WEIGHT LOSS 08/24/2007   NAUSEA AND VOMITING 08/24/2007    PCP: Ivonne Andrew, NP  REFERRING PROVIDER: Unknown Jim, MD  REFERRING DIAG: Bil adhesive capsulitis   THERAPY DIAG:  Chronic pain of both shoulders  Muscle weakness (generalized)  Rationale for Evaluation and Treatment: Rehabilitation  ONSET DATE: Chronic, ongoing for years   SUBJECTIVE:                                                                                                                                                                                     SUBJECTIVE STATEMENT: Patient reports her right shoulder is giving her a little more trouble this morning.  PAIN:  Are you having pain? Yes:  NPRS scale: 5/10 Pain location: Bilateral shoulders Pain description: constant aching Aggravating factors: Cooking,  washing dishes, grooming, reaching into cabinet Relieving factors: Rest  PERTINENT HISTORY: History of stroke, DVT, CHF, Afib, Neuropathy, neck pain with ACDF C3-5 in 2016, gout, DDD, ESRD, cervical stenosis, dialysis, kidney transplant (failed), see complex medical history above  PRECAUTIONS: see pertinent medical history  WEIGHT BEARING RESTRICTIONS: No  PATIENT GOALS: Improve shoulder pain and ability to reach overhead, put hair in ponytail   OBJECTIVE:  PATIENT SURVEYS:  FOTO 56 % functional status  POSTURE: Rounded shoulder and forward head posture  UPPER EXTREMITY ROM:   ROM Right eval Left eval Rt / Lt 08/12/2022 Rt / Lt 08/17/2022  Shoulder flexion 90 100 105 / 100 95 / 100  Shoulder extension      Shoulder abduction 65 90    Shoulder adduction      Shoulder internal rotation Reach to lateral iliac crest Reach to PSIS    Shoulder external rotation Unable to reach to head Reach to occiput    Elbow flexion      Elbow extension      Wrist flexion      Wrist extension      Wrist ulnar deviation      Wrist radial deviation      Wrist pronation      Wrist supination      (Blank rows = not tested)  UPPER EXTREMITY MMT:    Strength assessment within available range  MMT Right eval Left eval  Shoulder flexion 3 3  Shoulder  extension    Shoulder abduction 3 3  Shoulder adduction    Shoulder internal rotation    Shoulder external rotation    Middle trapezius    Lower trapezius    Elbow flexion    Elbow extension    Wrist flexion    Wrist extension    Wrist ulnar deviation    Wrist radial deviation    Wrist pronation    Wrist supination    Grip strength (lbs)    (Blank rows = not tested)  JOINT MOBILITY TESTING:  Not assessed  PALPATION:  Generalized tenderness of bilateral shoulders    TODAY'S TREATMENT:      [facility] Adult PT Treatment:                                                DATE: 08/17/22 Therapeutic Exercise: Nustep L5 x 5 min with UE/LE  while taking subjective, working on shoulder motion Seated overhead pulley for shoulder elevation x 4 min Seated shoulder flexion stretch with physioball 10 x 5 sec Standing row with red 2 x 10 Standing finger ladder x 3 each Reclined on wedge with pillows: Chest press with dowel 2 x 10 Shoulder flexion dowel 2 x 5 partial range due to pain Double ER with yellow 2 x 10 more of an isometric Modalities: MHP applied to shoulders x 10 min post session   [facility] Adult PT Treatment:                                                DATE: 08/12/22 Therapeutic Exercise: Nustep L3 x 8 min with UE/LE while taking subjective, working on shoulder motion Seated overhead pulley for shoulder elevation x 4 min Seated row with red 2 x 10 Seated shoulder flexion stretch with physioball 5 x 5 sec Reclined on wedge with pillows: Chest press with dowel 2 x 10 Shoulder flexion dowel 2 x 5 partial range due to pain Double ER with yellow 2 x 10 more of an isometric Bilateral elbow flexion with 1# in supination 2 x 10 Modalities: MHP applied to shoulders x 10 min post session  [facility] Adult PT Treatment:                                                DATE: 07/21/22 Therapeutic Exercise: Nustep level 5 x 8 mins - 328 steps Low row - 2x10 - GTB Shoulder ext - RTB - 2x10 Shoulder ER/IR YTB 10x each Reclined on wedge with pillows: Chest press - dowel only 2x10 Shoulder flexion dowel x10 - small range (difficult) Elbow flexion/extension - 1# 2x10 BIL  PATIENT EDUCATION: Education details: HEP Person educated: Patient Education method: Programmer, multimedia, Demonstration, Tactile cues, Verbal cues Education comprehension: verbalized understanding, returned demonstration, verbal cues required, tactile cues required, and needs further education  HOME EXERCISE PROGRAM: Access Code: [email or web address]    ASSESSMENT: CLINICAL IMPRESSION: Patient with fair tolerance for therapy, no adverse effects reported. She reported increased  right shoulder pain this visit and did exhibit a decrease in active shoulder flexion compared to last visit. Therapy continues to focus  on progressing shoulder mobility and light strengthening. No changes made to HEP this visit. Continued with MHP post session for pain relief. Patient would benefit from continued skilled PT to progress her shoulder mobility and strength in order to reduce pain and maximize functional ability.   OBJECTIVE IMPAIRMENTS: decreased activity tolerance, decreased ROM, decreased strength, postural dysfunction, and pain.   ACTIVITY LIMITATIONS: carrying, lifting, bathing, dressing, reach over head, and hygiene/grooming  PARTICIPATION LIMITATIONS: meal prep, cleaning, laundry, shopping, and community activity  PERSONAL FACTORS: Fitness, Past/current experiences, Social background, Time since onset of injury/illness/exacerbation, and 3+ comorbidities: see pertinent medical history above  are also affecting patient's functional outcome.    GOALS: Goals reviewed with patient? Yes  SHORT TERM GOALS: Target date: 08/03/2022  Patient will be I with initial HEP in order to progress with therapy. Baseline: HEP provided at eval 08/12/2022: progressing Goal status: ONGOING  2.  Patient will report bilateral shoulder pain </= 3/10 in order to reduce functional limitations Baseline: patient reports shoulder pain 6/10 08/12/2022: patient reports shoulder pain 3/10 Goal status: MET  LONG TERM GOALS: Target date: 08/31/2022  Patient will be I with final HEP to maintain progress from PT. Baseline: HEP provided at eval Goal status: INITIAL  2.  Patient will report >/= 63% status on FOTO to indicate improved functional ability. Baseline: 56% functional status Goal status: INITIAL  3.  Patient will demonstrate bilateral shoulder AROM flexion >/= 110 deg in order to improve reach into low shelf Baseline: shoulder AROM flexion 90 deg Goal status: INITIAL  4.  Patient will be  able to reach to occiput with right arm to demonstrate improved functional ER and ability to put her hair in a ponytail Baseline: patient unable to perform due to pain and limited motion Goal status: INITIAL   PLAN: PT FREQUENCY: 1x/week  PT DURATION: 8 weeks  PLANNED INTERVENTIONS: Therapeutic exercises, Therapeutic activity, Neuromuscular re-education, Balance training, Gait training, Patient/Family education, Self Care, Joint mobilization, Aquatic Therapy, Dry Needling, Cryotherapy, Moist heat, Manual therapy, and Re-evaluation  PLAN FOR NEXT SESSION: Review HEP and progress PRN, manual/stretching for bilateral shoulders as tolerated, progressive strengthening and ROM exercises   Hilbert Bible PT 08/17/22  9:32 AM Phone: [phone] Fax: [phone]

## 2022-08-18 IMAGING — XA IR FLUORO GUIDE CV LINE*R*
4 series · 13 of 16 positions shown · non-contrast
Comparison: none

Addendum:
INDICATION: Long-term hemodialysis patient with multiple venous occlusions. Poor
flow through tunneled right femoral hemodialysis catheter, most
recently revised 10/20/2019.

[Series 1: body 4 care · 3 of 20 frames shown (1 of 2)]
[frame 4/20]
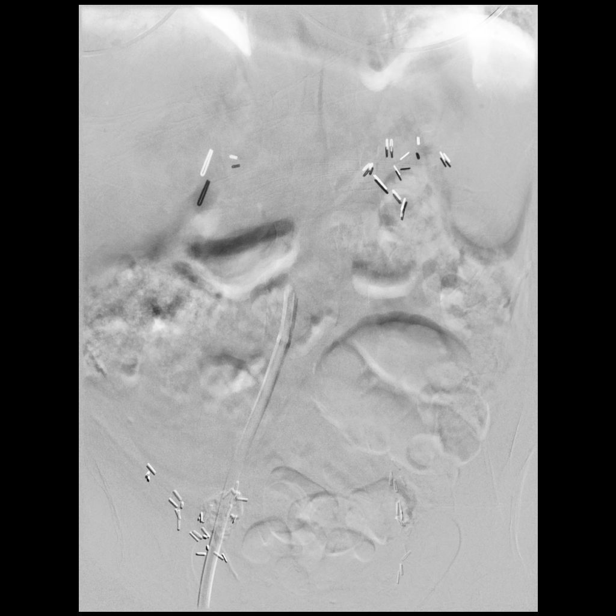
[frame 11/20]
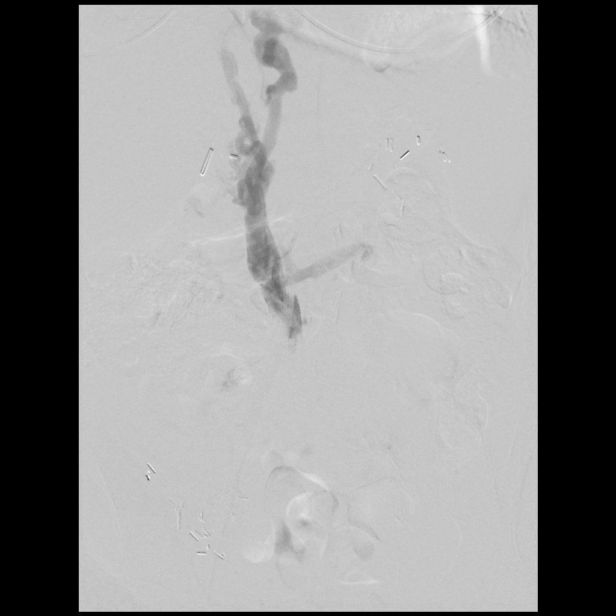
[frame 18/20]
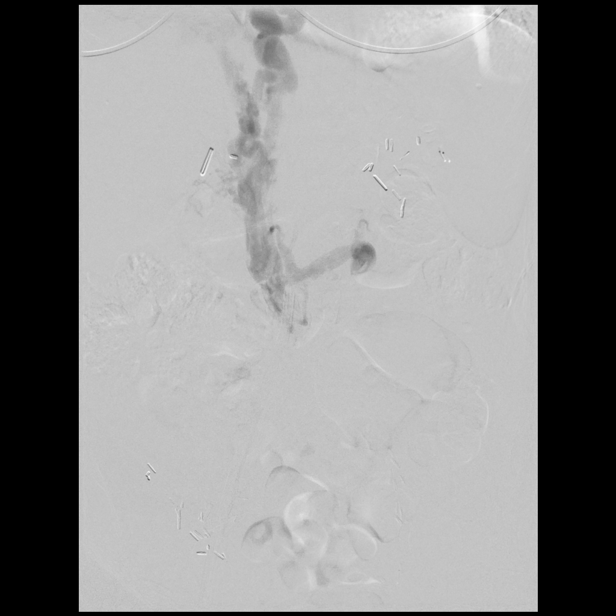

[Series 2: body 4 care · 3 acquisitions, 5 frames shown (2 of 2)]
[im 1/3]
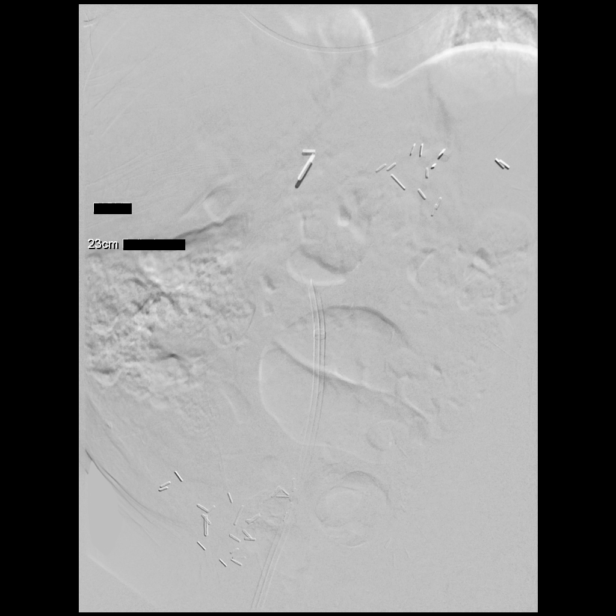
[im 1/3]
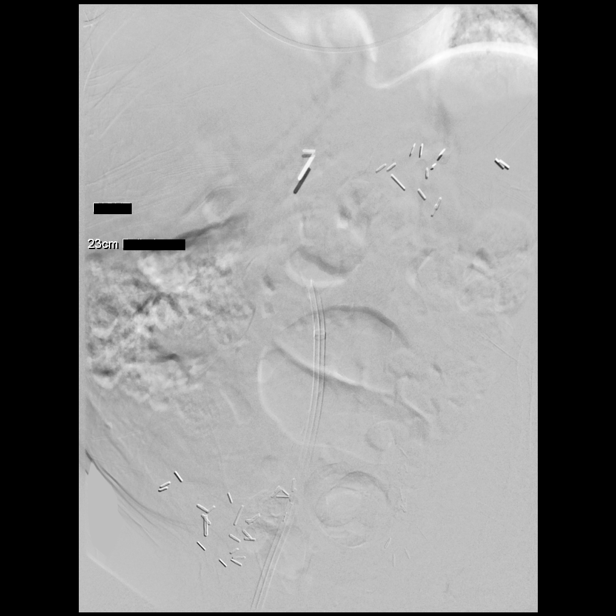
[im 1/3]
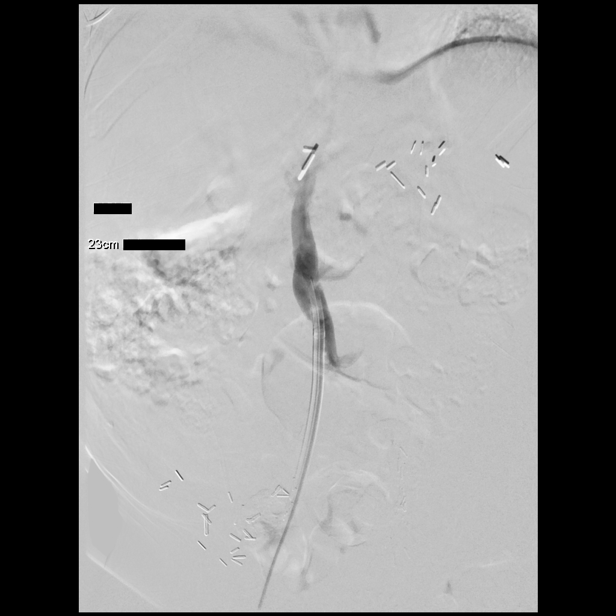
[im 2/3]
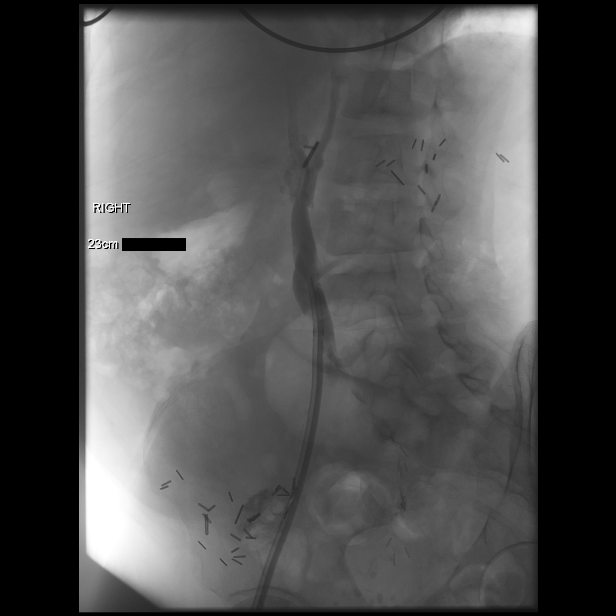
[im 3/3]
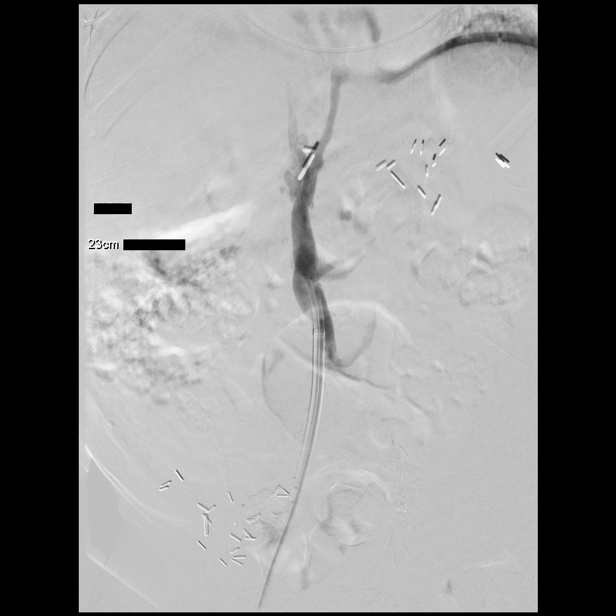

[Series 3: fl (-) angio · 1 of 1 slices shown (1 of 2)]
[im 1/1]
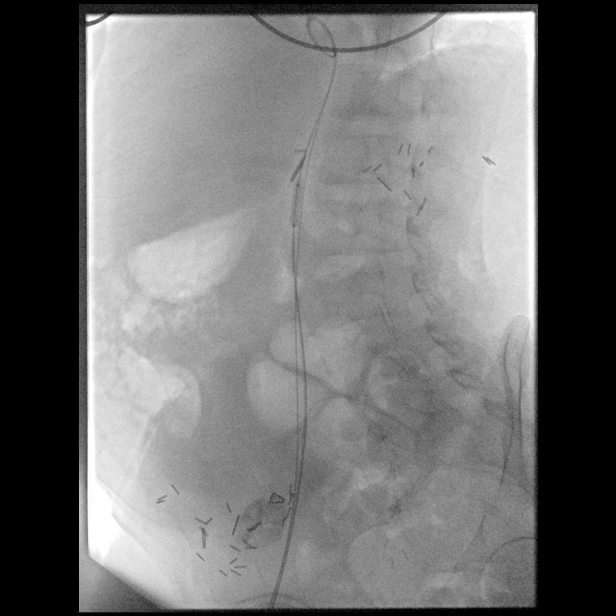

[Series 4: fl (-) angio · 2 acquisitions, 4 frames shown (2 of 2)]
[im 1/2]
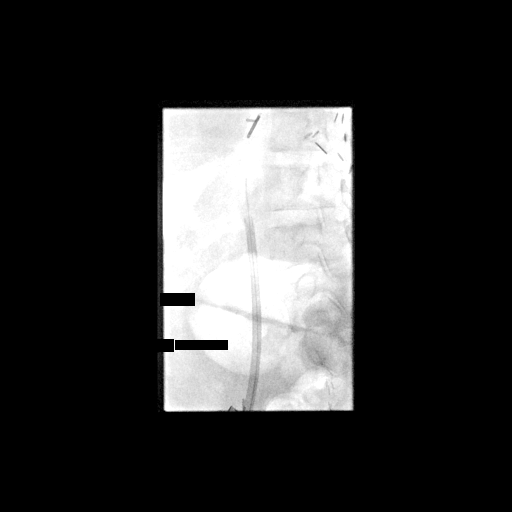
[im 1/2]
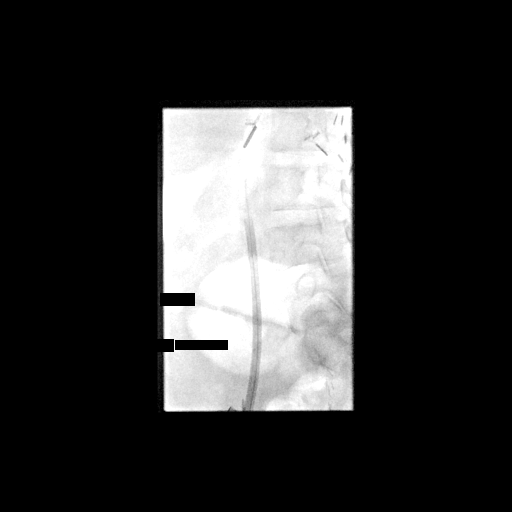
[im 1/2]
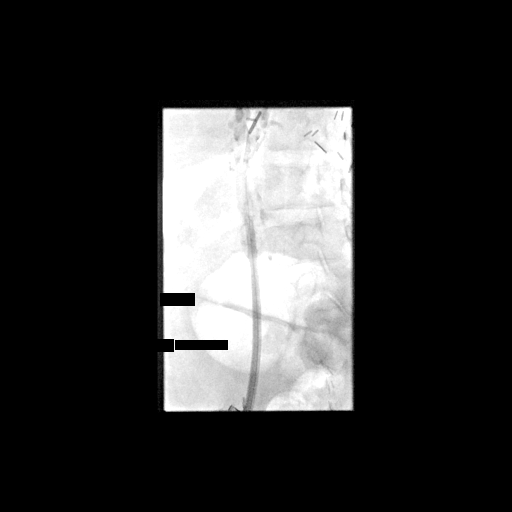
[im 2/2]
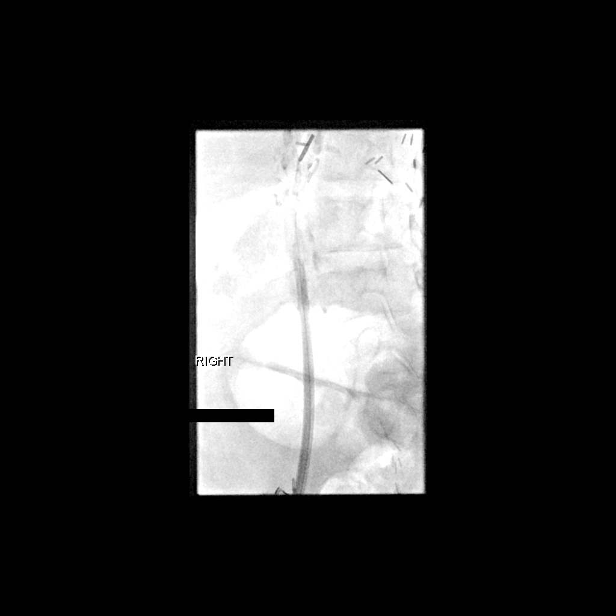

[13 of 16 positions shown; findings below may reference images not displayed]

EXAM:
TUNNELED CENTRAL VENOUS HEMODIALYSIS CATHETER EXCHANGE WITH
FLUOROSCOPIC GUIDANCE

INFERIOR VENA CAVOGRAPHY

VENOUS PTA

MEDICATIONS:
Cefazolin 2 g IV. The antibiotic was given in an appropriate time
interval prior to skin puncture.

ANESTHESIA/SEDATION:
Lidocaine 1% subcutaneous

FLUOROSCOPY TIME:  Fluoroscopy Time:  minutes  seconds ( mGy).

COMPLICATIONS:
None immediate.

PROCEDURE:
Informed written consent was obtained from the patient after a
discussion of the risks, benefits, and alternatives to treatment.
Questions regarding the procedure were encouraged and answered. The
right previously placed tunneled femoral venous catheter and
surrounding skin prepped with chlorhexidine in a sterile fashion,
and a sterile drape was applied covering the operative field.
Maximum barrier sterile technique with sterile gowns and gloves were
used for the procedure. A timeout was performed prior to the
initiation of the procedure.

After 1% lidocaine was administered liberally near the skin entry
site, the retention cuff was dissected free of the subcutaneous
tissues. Catheter was partially withdrawn and inferior vena cavagram
was performed.

Cavogram redemonstrated chronic intracaval thrombosis with a mature
venous collateral network draining above the diaphragm. The tip of
the original catheter had pulled back 2 the margin of the patent
segment inferiorly.

The catheter was exchanged over a stiff hydrophilic Glidewire. A new
23 cm 10 Palindrome hemodialysis catheter could be advanced only
partially into the IVC. A parallel Glidewire was placed but this did
not facilitate catheter passage. For this reason, over the multi
guidewires a 5 mm X 2 cm angioplasty balloon was advanced and used
to dilate the tract. This facilitated advancement of the
hemodialysis catheter, positioned with both side ports well within
the patent segment of the IVC, confirmed by repeat cavogram.

After follow-up cavogram through the catheter, the catheter was
flushed and primed per protocol, and secured externally with 0
Prolene suture.

Sterile dressings were applied. The patient tolerated the procedure
well without immediate post procedural complication.
IMPRESSION: 1. Chronic intrahepatic caval thrombosis.
2. Technically successful exchange and repositioning of tunneled
femoral hemodialysis catheter.

ADDENDUM:
Fluoro time: 6.1 minutes

*** End of Addendum ***
EXAM:
TUNNELED CENTRAL VENOUS HEMODIALYSIS CATHETER EXCHANGE WITH
FLUOROSCOPIC GUIDANCE

INFERIOR VENA CAVOGRAPHY

VENOUS PTA

MEDICATIONS:
Cefazolin 2 g IV. The antibiotic was given in an appropriate time
interval prior to skin puncture.

ANESTHESIA/SEDATION:
Lidocaine 1% subcutaneous

FLUOROSCOPY TIME:  Fluoroscopy Time:  minutes  seconds ( mGy).

COMPLICATIONS:
None immediate.

PROCEDURE:
Informed written consent was obtained from the patient after a
discussion of the risks, benefits, and alternatives to treatment.
Questions regarding the procedure were encouraged and answered. The
right previously placed tunneled femoral venous catheter and
surrounding skin prepped with chlorhexidine in a sterile fashion,
and a sterile drape was applied covering the operative field.
Maximum barrier sterile technique with sterile gowns and gloves were
used for the procedure. A timeout was performed prior to the
initiation of the procedure.

After 1% lidocaine was administered liberally near the skin entry
site, the retention cuff was dissected free of the subcutaneous
tissues. Catheter was partially withdrawn and inferior vena cavagram
was performed.

Cavogram redemonstrated chronic intracaval thrombosis with a mature
venous collateral network draining above the diaphragm. The tip of
the original catheter had pulled back 2 the margin of the patent
segment inferiorly.

The catheter was exchanged over a stiff hydrophilic Glidewire. A new
23 cm 10 Palindrome hemodialysis catheter could be advanced only
partially into the IVC. A parallel Glidewire was placed but this did
not facilitate catheter passage. For this reason, over the multi
guidewires a 5 mm X 2 cm angioplasty balloon was advanced and used
to dilate the tract. This facilitated advancement of the
hemodialysis catheter, positioned with both side ports well within
the patent segment of the IVC, confirmed by repeat cavogram.

After follow-up cavogram through the catheter, the catheter was
flushed and primed per protocol, and secured externally with 0
Prolene suture.

Sterile dressings were applied. The patient tolerated the procedure
well without immediate post procedural complication.
IMPRESSION: 1. Chronic intrahepatic caval thrombosis.
2. Technically successful exchange and repositioning of tunneled
femoral hemodialysis catheter.

## 2022-08-19 ENCOUNTER — Encounter (HOSPITAL_COMMUNITY): Payer: Self-pay | Admitting: Internal Medicine

## 2022-08-19 ENCOUNTER — Ambulatory Visit (HOSPITAL_COMMUNITY)
Admission: RE | Admit: 2022-08-19 | Discharge: 2022-08-19 | Disposition: A | Discharge status: Home / Self Care | Payer: Medicare Other | Source: Ambulatory Visit | Attending: Internal Medicine | Admitting: Internal Medicine

## 2022-08-19 VITALS — BP 90/50 | HR 73 | Wt 110.4 lb

## 2022-08-19 DIAGNOSIS — Z940 Kidney transplant status: Secondary | ICD-10-CM | POA: Diagnosis not present

## 2022-08-19 DIAGNOSIS — K3184 Gastroparesis: Secondary | ICD-10-CM | POA: Diagnosis not present

## 2022-08-19 DIAGNOSIS — N186 End stage renal disease: Secondary | ICD-10-CM | POA: Insufficient documentation

## 2022-08-19 DIAGNOSIS — I4892 Unspecified atrial flutter: Secondary | ICD-10-CM | POA: Diagnosis not present

## 2022-08-19 DIAGNOSIS — I959 Hypotension, unspecified: Secondary | ICD-10-CM | POA: Insufficient documentation

## 2022-08-19 DIAGNOSIS — R0789 Other chest pain: Secondary | ICD-10-CM | POA: Insufficient documentation

## 2022-08-19 DIAGNOSIS — I480 Paroxysmal atrial fibrillation: Secondary | ICD-10-CM | POA: Diagnosis not present

## 2022-08-19 DIAGNOSIS — I132 Hypertensive heart and chronic kidney disease with heart failure and with stage 5 chronic kidney disease, or end stage renal disease: Secondary | ICD-10-CM | POA: Insufficient documentation

## 2022-08-19 DIAGNOSIS — I5032 Chronic diastolic (congestive) heart failure: Secondary | ICD-10-CM | POA: Insufficient documentation

## 2022-08-19 DIAGNOSIS — Z79899 Other long term (current) drug therapy: Secondary | ICD-10-CM | POA: Diagnosis not present

## 2022-08-19 DIAGNOSIS — I493 Ventricular premature depolarization: Secondary | ICD-10-CM | POA: Insufficient documentation

## 2022-08-19 DIAGNOSIS — Z720 Tobacco use: Secondary | ICD-10-CM | POA: Diagnosis not present

## 2022-08-19 DIAGNOSIS — Z8673 Personal history of transient ischemic attack (TIA), and cerebral infarction without residual deficits: Secondary | ICD-10-CM | POA: Insufficient documentation

## 2022-08-19 NOTE — Patient Instructions (Addendum)
Congratulations!!! You have graduated the [facility].  If you need [name] in the future please give our office a call.

## 2022-08-19 NOTE — Progress Notes (Signed)
ReDS Vest / Clip - 08/19/22 1400       ReDS Vest / Clip   Station Marker A    Ruler Value 20    ReDS Value Range Low volume    ReDS Actual Value 35

## 2022-08-19 NOTE — Progress Notes (Signed)
ADVANCED HF CLINIC NOTE  Primary Cardiologist: Dr. Flora Lipps  Nephrologist: Dr. Malen Gauze HF Cardiologist: Dr. Gala Romney   HPI: Tina Watkins is a 57 y.o. female with a hx of gout, gastroparesis, CVA, ESRD status post renal transplant, heart failure with preserved ejection fraction, hypertension.  In 12/21, gained a lot of fluid (~30 pounds) over the holidays and was referred to Dr. Flora Lipps. He recommended IV diuresis but she did not want to be admitted. Saw Dr. Arrie Aran who switched to torsemide   Admitted 2/21 for volume overload. Did very well with IV diuresis. Weigh down to 119 pounds. She did bump creatinine to 3.6 so diuretics held for a short time. RHC attempted but could not complete due to access challenges and inability to get into the PA. RA pressure was 3 and CO was normal. There was evidence of multiple venous narrowings in the IVC.   Follow up in AHF clinic 11/21 for pre-op surgical risk stratification for PD catheter. Started back on HD over the summer. BP low at HD frequently and taking midodrine.   Admitted 2//22 for atrial fibrillation with RVR. Sent from HD, resolved with IV diltiazem, but complicated by subsequent hypotension. Repeat echo EF 65-70%, RV ok.  Zio placed in 3/22 for palpitations and NST ordered. Rare PACs and PVCs, < 1% AFL. NST (3/22) no evidence of ischemia, EF > 65%, low risk study.  Returns for HF f/u.  At last visit  she was hving some atypical CP. Myoview 11/23 EF 75% normal perfusion. Says she is feeling good. Tolerating dialysis. Rare sharp CP. Denies dyspnea. BP has been stable. + cough when she lies down. Still smoking 1/3-1/2 ppd    ROS: All systems reviewed and negative except as per HPI.   Cardiac studies: Zio (2/22): mostly NSR, < 1% AFL, rare PACs/PVCs  Echo (2/22): EF 65-70%, RV ok  Zio (1/22): Sinus rhythm - avg HR of 78 bpm, Attrial Flutter occurred (<1% burden), ranging from 113-208 bpm (avg of 148 bpm), the longest lasting  1 hour 16 mins with an avg rate of 139 bpm, rare PACs and PVCs.    RHC 2/21: RHC attempted but could not complete due to  Access challenges and inability to get into the PA. RA pressure was 3 and cardiac output was normal. There was evidence of multiple venous narrowings in the IVC.   Echo 12/20: EF 60-65%, mod increased LVH, Grade III DD, mod elevated PA pressure, RVSP 44.6 mmHg, moderate TR  Echo 8/17: EF 60-65%, PA peak pressure 48 mmHg,  Past Medical History:  Diagnosis Date   Bacteremia due to Gram-negative bacteria 05/23/2011   Blind    right eye   Blind right eye    CHF (congestive heart failure) (HCC)    Chronic lower back pain    Complication of anesthesia    "woke up during OR; I have an extremely high tolerance" (12/11/2011) 1 procedure was graft; the other procedures were procedures that are typically done with sedation.   DDD (degenerative disc disease), cervical    Depression    Dysrhythmia    "tachycardia" (12/11/2011) new onset afib 10/15/14 EKG   E coli bacteremia 06/18/2011   Elevated LDL cholesterol level 12/2018   ESRD (end stage renal disease) (HCC) 06/12/2011   Fibromyalgia    Gastroparesis    Gastroparesis    GERD (gastroesophageal reflux disease)    Glaucoma    right eye   Gout    H/O carpal tunnel syndrome  Headache 10/2019   Headache(784.0)    "not often anymore" (12/11/2011)   Herpes genitalia 1994   History of blood transfusion    "more than a few times" (12/11/2011)   History of stomach ulcers    Hypotension    Iron deficiency anemia    New onset a-fib (HCC)    10/15/14 EKG   Osteopenia    Peripheral neuropathy 11/2018   Pressure ulcer of sacral region, stage 1 07/2019   Seizures (HCC) 1994   "post transplant; only have had that one" (12/11/2011)   Spinal stenosis in cervical region    Stroke [facility])     left basal ganglia lacunar infarct; Right frontal lobe lacunar infarct.   Stroke [facility]) ~ 1999; 2001   "briefly lost my vision; lost my right  eye" (12/11/2011)   Vitamin D deficiency 12/2018    Current Outpatient Medications  Medication Sig Dispense Refill   allopurinol (ZYLOPRIM) 100 MG tablet TAKE 1 TABLET BY MOUTH EVERY DAY 90 tablet 3   amiodarone (PACERONE) 200 MG tablet Take 0.5 tablets (100 mg total) by mouth daily. 90 tablet 1   Colchicine 0.6 MG CAPS Take 0.6 mg by mouth daily as needed (gout flare up). 30 capsule 2   cyclobenzaprine (FLEXERIL) 5 MG tablet Take 1 tablet (5 mg total) by mouth at bedtime as needed. 30 tablet 0   diclofenac Sodium (VOLTAREN) 1 % GEL Apply 4 g topically as needed. 50 g 0   diphenhydrAMINE (BENADRYL) 25 mg capsule Take 1 capsule (25 mg total) by mouth every 8 (eight) hours as needed. 30 capsule 6   dorzolamide-timolol (COSOPT) 2-0.5 % ophthalmic solution Place 1 drop into the left eye 2 (two) times daily.     ELIQUIS 2.5 MG TABS tablet TAKE 1 TABLET BY MOUTH TWICE A DAY 60 tablet 3   escitalopram (LEXAPRO) 20 MG tablet Take 20 mg by mouth daily.     famotidine (PEPCID) 20 MG tablet TAKE 1 TABLET BY MOUTH TWICE A DAY 180 tablet 3   fluticasone (FLONASE) 50 MCG/ACT nasal spray Place 1 spray into both nostrils daily as needed for allergies or rhinitis.     fluticasone-salmeterol (ADVAIR HFA) 115-21 MCG/ACT inhaler Inhale 2 puffs into the lungs 2 (two) times daily. 1 each 12   gabapentin (NEURONTIN) 100 MG capsule Take 100 mg by mouth 3 (three) times daily.     lidocaine (LIDODERM) 5 % Place 1 patch onto the skin daily as needed (for pain- remove old patch first).      metoCLOPramide (REGLAN) 5 MG tablet Take 1 tablet (5 mg total) by mouth 2 (two) times daily before a meal. 180 tablet 3   midodrine (PROAMATINE) 10 MG tablet Take 1.5 tablets (15 mg total) by mouth 3 (three) times daily. NEEDS FOLLOW UP APPOINTMENT FOR ANYMORE REFILLS 405 tablet 0   omeprazole (PRILOSEC) 40 MG capsule TAKE 1 CAPSULE (40 MG TOTAL) BY MOUTH DAILY. 90 capsule 1   ondansetron (ZOFRAN-ODT) 8 MG disintegrating tablet Take 8  mg by mouth as needed for nausea or vomiting.     oxyCODONE-acetaminophen (PERCOCET) 10-325 MG tablet Take 1 tablet by mouth 4 (four) times daily as needed for pain.     VELPHORO 500 MG chewable tablet Chew 500 mg by mouth 3 (three) times daily.     Vitamin D, Ergocalciferol, (DRISDOL) 1.25 MG (50000 UNIT) CAPS capsule TAKE 1 CAPSULE (50,000 UNITS TOTAL) BY MOUTH EVERY 7 (SEVEN) DAYS 5 capsule 6   zolpidem (AMBIEN) 10  MG tablet Take 1 tablet (10 mg total) by mouth at bedtime as needed for sleep. 10 tablet 0   No current facility-administered medications for this encounter.    Allergies  Allergen Reactions   Levofloxacin Itching and Rash   Tape Other (See Comments)    "Certain surgical tapes peel off my skin"     Social History   Socioeconomic History   Marital status: Single    Spouse name: Not on file   Number of children: 0   Years of education: some college   Highest education level: Some college, no degree  Occupational History   Occupation: disabled    Associate Professor: UNEMPLOYED  Tobacco Use   Smoking status: Every Day    Current packs/day: 1.50    Average packs/day: 1.5 packs/day for 35.0 years (52.5 ttl pk-yrs)    Types: Cigarettes    Passive exposure: Never   Smokeless tobacco: Never   Tobacco comments:    Pack of cigarettes lasts 2 days 09/18/2020  Vaping Use   Vaping status: Never Used  Substance and Sexual Activity   Alcohol use: Not Currently    Comment: rarely   Drug use: Yes    Frequency: 1.0 times per week    Types: Marijuana    Comment: occ   Sexual activity: Not Currently    Partners: Male    Birth control/protection: None  Other Topics Concern   Not on file  Social History Narrative   Right-handed.   Four cups caffeine per day.   Lives alone.   Social Determinants of Health   Financial Resource Strain: Low Risk  (06/25/2022)   Overall Financial Resource Strain (CARDIA)    Difficulty of Paying Living Expenses: Not hard at all  Food Insecurity: No  Food Insecurity (06/25/2022)   Hunger Vital Sign    Worried About Running Out of Food in the Last Year: Never true    Ran Out of Food in the Last Year: Never true  Transportation Needs: No Transportation Needs (06/21/2022)   PRAPARE - Administrator, Civil Service (Medical): No    Lack of Transportation (Non-Medical): No  Physical Activity: Inactive (06/25/2022)   Exercise Vital Sign    Days of Exercise per Week: 0 days    Minutes of Exercise per Session: 0 min  Stress: Stress Concern Present (06/25/2022)   [employer] of Occupational Health - Occupational Stress Questionnaire    Feeling of Stress : Very much  Social Connections: Socially Isolated (06/25/2022)   Social Connection and Isolation Panel [NHANES]    Frequency of Communication with Friends and Family: More than three times a week    Frequency of Social Gatherings with Friends and Family: Once a week    Attends Religious Services: Never    Database administrator or Organizations: No    Attends Banker Meetings: Never    Marital Status: Never married  Intimate Partner Violence: Not At Risk (06/25/2022)   Humiliation, Afraid, Rape, and Kick questionnaire    Fear of Current or Ex-Partner: No    Emotionally Abused: No    Physically Abused: No    Sexually Abused: No   Family History  Problem Relation Age of Onset   Glaucoma Mother    Pancreatic cancer Father    Multiple sclerosis Brother    Diabetes Maternal Uncle    Hypertension Maternal Grandmother    Breast cancer Neg Hx    BP (!) 90/50   Pulse 73  Wt 50.1 kg (110 lb 6.4 oz)   SpO2 99%   BMI 22.30 kg/m   Wt Readings from Last 3 Encounters:  08/19/22 50.1 kg (110 lb 6.4 oz)  07/01/22 50.5 kg (111 lb 6.4 oz)  06/25/22 51.7 kg (114 lb)   PHYSICAL EXAM: General:  Weak appearing. No resp difficulty HEENT: R eye enucleation Neck: supple. no JVD. Carotids 2+ bilat; no bruits. No lymphadenopathy or thryomegaly appreciated. Cor: PMI  nondisplaced. Regular rate & rhythm. 2/6 TR Lungs: clear Abdomen: soft, nontender, nondistended. No hepatosplenomegaly. No bruits or masses. Good bowel sounds. Extremities: no cyanosis, clubbing, rash, tr-1+ woody edema Neuro: alert & orientedx3, cranial nerves grossly intact. moves all 4 extremities w/o difficulty. Affect pleasant   ECG (personally reviewed): NSR 71 bpm 1AVB 212 ms  No ST-T wave abnormalities. Personally reviewed   ASSESSMENT & PLAN:  1. Chest pressure and orthopnea - Myoview 11/23 EF 75% normal perfusion. - Still with some atypical CP but does not sound ischemic in nature - Continue RF management   2. 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 Grade 3 DD - Echo (2/22): EF 65-70%, RV normal. - Stable NYHA II-early III, functional status difficult due to ESRD and deconditioning.  - REDS 35% today - Volume per HD.  - Continue midodrine.  3. Atrial fibrillation, paroxysmal - Zio patch (2/22) with <1% atrial flutter burden, some PVC/PACs, mostly NSR. - Lexiscan (3/22) showed no evidence of ischemia, EF > 65%, low risk study. - ChadsVasc Score 6.  - Continue Eliquis 2.5 mg bid (weight < 60 kg & ESRD). No bleeding issues. - Continue amiodarone 200 mg daily. - Followed by AF clinic. - In NSR today  4. Hypotension - Continue midodrine 15 mg tid.  5. Tobacco use - Continues to smoke  - Encouraged her to quit   6. ESRD - s/p previous failed renal transplant. - She is T/TH/Sat HD.  Will follow with General Cardiology. Can return to HF Clinic as needed.  Arvilla Meres, MD  08/19/22 1:57 PM

## 2022-08-20 NOTE — Progress Notes (Signed)
Office Visit Note  Patient: Tina Watkins             Date of Birth: Dec 19, 1965           MRN: [id number]             PCP: Ivonne Andrew, NP Referring: Kathrynn Speed, NP Visit Date: 08/21/2022   Subjective:  Follow-up (Patient states she has papers for the doctor to fill out.)   History of Present Illness: Tina Watkins is a 57 y.o. female here for follow up for chronic gout on allopurinol, osteoarthritis, and chronic rotator cuff arthropathy. She resumed working with physical therapy and making partial increase in ROM but still very painful in right shoulder. Her mobility is fair but has a lot of hip pain worse getting up each time. Inability to lift or reach overhead impairing her ADLs considerably. She is being assisted with full time line-in aide who also provides her transportation to hemodialysis. Needs paperwork completed today for her housing accommodations.    Previous HPI 03/09/2022 Tina Watkins is a 57 y.o. female here for follow up with joint pain in multiple areas current exacerbation doing worse with right shoulder and left hip pain. Chronic gout on allopurinol appears well controlled.  Hip is painful when getting up or down from seated position or lying on her side.  Most noticeable while trying to sleep occasionally waking to reposition.  She does not have any hip pain with weightbearing when she is up and walking.  Also having worsening again with the right shoulder pain.  Previous bilateral shoulder injections last year only helped on the left side.  She had a lot of improvement in range of motion on the right side the after the physical therapy course.    Previous HPI 01/14/22 Tina Watkins is a 57 y.o. female here for follow up for chronic joint pain with history of neuropathy, gout, osteoarthritis, and possible fibromyalgia syndrome. At last visit no new flare ups of inflammatory arthritis. We tried bilateral shoulder subacromial bursa steroid  injections. She had some pain relief after the injection but did feel like the pain came back again similar as before.  She started physical therapy as referred by her orthopedist has had 5 or 6 sessions so far and is noticing an improvement with the shoulder range of motion and stiffness.  She followed up with them this morning with recommendation to continue therapy for now and plan follow-up in about February for repeat shoulder injection.  She has not had any new flareups of gout.  She is having some increase in lateral hip pain this is most noticeable when lying on the bed trying to sleep.  Rarely awakens her from sleep.  She is doing some stationary biking exercise with the physical therapist but no specific range of motion exercises for her hips.   Previous HPI 09/26/21 Tina Watkins is a 57 y.o. female here for follow up or evaluation of pain with history of neuropathy, gout, osteoarthritis, and possible fibromyalgia syndrome.  She is not experiencing any major flareups with the toe inflammation or swelling since her last visit.  Does still continue having considerable pain on a daily basis at multiple sites including lower extremities hands shoulders neck and back.  Bilateral shoulder pains were improved with the steroid injections at her last visit but has experienced worsening again with stiffness and difficulty in range of motion.   Previous HPI 07/07/2021 Tina Watkins is  a 57 y.o. female here for follow up for evaluation of pain with history of neuropathy, gout, osteoarthritis, and possible fibromyalgia syndrome.  At initial visit uric acid level was 3.2 suspect gout is well controlled at this time. Imaging of hands and shoulders did not show significant shoulder arthritis but hands with chronic changes including probable small distal erosions. She continues to have a lot of pain also feeling numbness and swelling involving both hands. She is unable to easily reach up or behind her back,  also with increased pain lying on her side provokes worse numbness to that side.   Previous HPI 06/06/2021 Tina Watkins is a 57 y.o. female here for evaluation of pain with history of neuropathy, gout, osteoarthritis, and possible fibromyalgia syndrome. 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 She has joint pain and stiffness in a lot of areas. Muscle pains in neck and upper back.  Large joint pains have been bothering her for years varying in severity.  Shoulders and back are doing worse particularly in the past month.  Episodic gout flares have caused severe pain in first MTP joints.  Most recently gout flare in the left great toe about a week ago improved with colchicine treatment.  However on a routine basis having more symptoms in the bilateral hands and shoulders.  She was started on duloxetine 30 mg for lower extremity neuropathy pain. On allopurinol 100 mg daily for gout. Previous knee xray and MRI findings with chondrocalcinosis and bone infarcts. Right hip imaging with AVN.   Review of Systems  Constitutional:  Positive for fatigue.  HENT:  Negative for mouth sores and mouth dryness.   Eyes:  Positive for dryness.  Respiratory:  Positive for shortness of breath.   Cardiovascular:  Positive for chest pain and palpitations.  Gastrointestinal:  Negative for blood in stool, constipation and diarrhea.  Endocrine: Negative for increased urination.  Genitourinary:  Negative for involuntary urination.  Musculoskeletal:  Positive for joint pain, joint pain, joint swelling, myalgias, muscle weakness, morning stiffness, muscle tenderness and myalgias. Negative for gait problem.  Skin:  Positive for color change. Negative for rash, hair loss and sensitivity to sunlight.  Allergic/Immunologic: Negative for susceptible to infections.  Neurological:  Positive for dizziness and headaches.  Hematological:  Negative for swollen  glands.  Psychiatric/Behavioral:  Positive for depressed mood. Negative for sleep disturbance. The patient is nervous/anxious.     PMFS History:  Patient Active Problem List   Diagnosis Date Noted   Encounter for monitoring amiodarone therapy 07/01/2022   Atrial fibrillation (HCC) 07/01/2022   Bilateral hip pain 01/14/2022   Prolonged Q-T interval on ECG 09/07/2021   Nausea & vomiting 09/06/2021   Bilateral shoulder pain 06/06/2021   Bilateral hand swelling 06/06/2021   Cervical radiculitis 01/24/2021   Acute respiratory failure with hypoxia (HCC) 10/23/2020   Coagulation defect, unspecified (HCC) 08/01/2020   Aortic atherosclerosis (HCC) 07/10/2020   Hypercoagulable state due to atrial fibrillation (HCC) 04/23/2020   Atrial flutter (HCC) 03/29/2020   Chronic pain of both knees 03/13/2020   Chills (without fever) 12/12/2019   Other pancytopenia (HCC)    Decubitus ulcer of sacral region, stage 1 08/18/2019   HCAP (healthcare-associated pneumonia) 08/05/2019   Pressure injury of skin 07/24/2019   Chest congestion 07/24/2019   Itching 07/24/2019   Weakness 06/13/2019   Pneumonia due to COVID-19 virus 05/09/2019   Hypotension 05/09/2019   Symptomatic anemia 05/09/2019  CHF (congestive heart failure) (HCC) 05/09/2019   Swollen abdomen 01/04/2019   DDD (degenerative disc disease), cervical 12/06/2018   Neuropathy 12/06/2018   Paresthesia 10/11/2018   Neck pain 10/11/2018   Tobacco abuse 11/20/2017   Right leg swelling 11/20/2017   Right leg pain 11/20/2017   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   GERD (gastroesophageal reflux disease) 11/20/2017   Acute venous embolism and thrombosis of deep vessels of proximal lower extremity, right (HCC) 11/20/2017   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    Thrush, oral    AKI (acute kidney injury) (HCC)    Multifocal pneumonia 08/09/2017   ETD (Eustachian tube dysfunction), bilateral 08/03/2017    Acute recurrent pansinusitis 07/21/2017   Chest pain 09/29/2015   Cervical stenosis of spinal canal 01/08/2015   Urinary tract infectious disease    Muscle spasms of neck 06/15/2014   Bleeding hemorrhoid 06/15/2014   Anemia in chronic kidney disease 06/15/2014   Pyelonephritis, acute 06/13/2014   Sepsis (HCC) 06/13/2014   History of kidney transplant    Abnormal CT scan 09/14/2012   Chronic pain syndrome 04/09/2012   Dehydration, mild 04/09/2012   Herpes infection 06/23/2011   Anxiety 06/23/2011   E coli bacteremia 06/18/2011   ESRD (end stage renal disease) (HCC) 06/12/2011   UTI (lower urinary tract infection) 06/12/2011   Bacteremia due to Gram-negative bacteria 05/23/2011   History of renal transplantation 05/22/2011   Septic shock(785.52) 05/22/2011   Acute on chronic kidney failure (HCC) 05/22/2011   Gastroparesis 04/24/2008   WEIGHT LOSS 08/24/2007   NAUSEA AND VOMITING 08/24/2007    Past Medical History:  Diagnosis Date   Bacteremia due to Gram-negative bacteria 05/23/2011   Blind    right eye   Blind right eye    CHF (congestive heart failure) (HCC)    Chronic lower back pain    Complication of anesthesia    "woke up during OR; I have an extremely high tolerance" (12/11/2011) 1 procedure was graft; the other procedures were procedures that are typically done with sedation.   DDD (degenerative disc disease), cervical    Depression    Dysrhythmia    "tachycardia" (12/11/2011) new onset afib 10/15/14 EKG   E coli bacteremia 06/18/2011   Elevated LDL cholesterol level 12/2018   ESRD (end stage renal disease) (HCC) 06/12/2011   Fibromyalgia    Gastroparesis    Gastroparesis    GERD (gastroesophageal reflux disease)    Glaucoma    right eye   Gout    H/O carpal tunnel syndrome    Headache 10/2019   Headache(784.0)    "not often anymore" (12/11/2011)   Herpes genitalia 1994   History of blood transfusion    "more than a few times" (12/11/2011)   History of stomach  ulcers    Hypotension    Iron deficiency anemia    New onset a-fib (HCC)    10/15/14 EKG   Osteopenia    Peripheral neuropathy 11/2018   Pressure ulcer of sacral region, stage 1 07/2019   Seizures (HCC) 1994   "post transplant; only have had that one" (12/11/2011)   Spinal stenosis in cervical region    Stroke [facility])     left basal ganglia lacunar infarct; Right frontal lobe lacunar infarct.   Stroke [facility]) ~ 1999; 2001   "briefly lost my vision; lost my right eye" (12/11/2011)   Vitamin D deficiency 12/2018    Family History  Problem Relation Age of  Onset   Glaucoma Mother    Pancreatic cancer Father    Multiple sclerosis Brother    Diabetes Maternal Uncle    Hypertension Maternal Grandmother    Breast cancer Neg Hx    Past Surgical History:  Procedure Laterality Date   ANTERIOR CERVICAL DECOMP/DISCECTOMY FUSION N/A 01/08/2015   Procedure: Anterior Cervical Three-Four/Four-Five Decompression/Diskectomy/Fusion;  Surgeon: Hilda Lias, MD;  Location: [facility]URO [facility];  Service: Neurosurgery;  Laterality: N/A;  C3-4 C4-5 Anterior cervical decompression/diskectomy/fusion   APPENDECTOMY  ~ 2004   BIOPSY  09/12/2020   Procedure: BIOPSY;  Surgeon: Rachael Fee, MD;  Location: [facility] ENDOSCOPY;  Service: Endoscopy;;   CATARACT EXTRACTION     right eye   COLONOSCOPY     COLONOSCOPY WITH PROPOFOL N/A 09/12/2020   Procedure: COLONOSCOPY WITH PROPOFOL;  Surgeon: Rachael Fee, MD;  Location: [facility] END[facility];  Service: Endoscopy;  Laterality: N/A;   ENUCLEATION  2001   "right"   ESOPHAGOGASTRODUODENOSCOPY (EGD) WITH PROPOFOL N/A 04/21/2012   Procedure: ESOPHAGOGASTRODUODENOSCOPY (EGD) WITH PROPOFOL;  Surgeon: Rachael Fee, MD;  Location: [facility];  Service: Endoscopy;  Laterality: N/A;   ESOPHAGOGASTRODUODENOSCOPY (EGD) WITH PROPOFOL N/A 09/12/2020   Procedure: ESOPHAGOGASTRODUODENOSCOPY (EGD) WITH PROPOFOL;  Surgeon: Rachael Fee, MD;  Location: [facility];  Service: Endoscopy;   Laterality: N/A;   INSERTION OF DIALYSIS CATHETER  1988   "AV graft LUA & LFA; LUA worked for 1 day; LFA never worked"   IR FLUORO GUIDE CV LINE RIGHT  07/11/2019   IR FLUORO GUIDE CV LINE RIGHT  10/20/2019   IR FLUORO GUIDE CV LINE RIGHT  05/31/2020   IR PTA VENOUS EXCEPT DIALYSIS CIRCUIT  05/31/2020   IR RADIOLOGY PERIPHERAL GUIDED IV START  07/11/2019   IR US GUIDE VASC ACCESS RIGHT  07/11/2019   IR US GUIDE VASC ACCESS RIGHT  07/11/2019   IR US GUIDE VASC ACCESS RIGHT  07/11/2019   IR VENOCAVAGRAM IVC  05/31/2020   KIDNEY TRANSPLANT  1994; 1999; 2005   "right"   MULTIPLE TOOTH EXTRACTIONS     POLYPECTOMY  09/12/2020   Procedure: POLYPECTOMY;  Surgeon: Rachael Fee, MD;  Location: [facility];  Service: Endoscopy;;   RIGHT HEART CATH N/A 03/23/2019   Procedure: RIGHT HEART CATH;  Surgeon: Dolores Patty, MD;  Location: [facility];  Service: Cardiovascular;  Laterality: N/A;   TONSILLECTOMY     TOTAL NEPHRECTOMY  1988?; 1994; 2005   Social History   Social History Narrative   Right-handed.   Four cups caffeine per day.   Lives alone.   Immunization History  Administered Date(s) Administered   Hepatitis B, PED/ADOLESCENT 07/20/2019, 08/29/2019, 09/21/2019   Hepb-cpg 07/20/2019, 08/29/2019, 09/21/2019, 11/23/2019   Influenza Split 11/07/2019   Influenza, Quadrivalent, Recombinant, Inj, Pf 11/11/2021   Influenza,inj,Quad PF,6+ Mos 11/07/2019, 11/07/2020   PFIZER Comirnaty(Gray Top)Covid-19 Tri-Sucrose Vaccine 07/29/2020   PFIZER(Purple Top)SARS-COV-2 Vaccination 05/31/2019, 06/21/2019, 10/26/2019   PNEUMOCOCCAL CONJUGATE-20 06/04/2022   PPD Test 08/10/2017   Pfizer Covid-19 Vaccine Bivalent Booster 55yrs & up 02/06/2021     Objective: Vital Signs: BP (!) 86/42 (BP Location: Left Arm, Patient Position: Sitting, Cuff Size: Normal)   Pulse 62   Resp 14   Ht 4\' 11"  (1.499 m)   Wt 109 lb (49.4 kg)   BMI 22.02 kg/m    Physical Exam Eyes:     Comments: Right eye blind   Cardiovascular:     Rate and Rhythm: Normal rate and regular rhythm.  Pulmonary:     Effort: Pulmonary effort is normal.     Breath sounds: Normal breath sounds.  Lymphadenopathy:     Cervical: No cervical adenopathy.  Skin:    General: Skin is warm and dry.  Neurological:     Mental Status: She is alert.     Musculoskeletal Exam:  Bilateral shoulder soreness to pressure, no swelling, limited in abduction ROM worse on right Wrists full ROM no tenderness or swelling Fingers full ROM no tenderness or swelling Left lateral hip tenderness to pressure Knees full ROM no tenderness or swelling, crepitus   Investigation: No additional findings.  Imaging: No results found.  Recent Labs: Lab Results  Component Value Date   WBC 5.5 06/10/2022   HGB 9.1 (L) 06/10/2022   PLT 194 06/10/2022   NA 133 (L) 07/01/2022   K 4.0 07/01/2022   CL 94 (L) 07/01/2022   CO2 26 07/01/2022   GLUCOSE 93 07/01/2022   BUN 18 07/01/2022   CREATININE 5.58 (H) 07/01/2022   BILITOT 0.7 07/01/2022   ALKPHOS 140 (H) 07/01/2022   AST 22 07/01/2022   ALT 15 07/01/2022   PROT 6.8 07/01/2022   ALBUMIN 3.8 07/01/2022   CALCIUM 9.7 07/01/2022   GFRAA 13 (L) 10/13/2019    Speciality Comments: No specialty comments available.  Procedures:  No procedures performed Allergies: Levofloxacin and Tape   Assessment / Plan:     Visit Diagnoses: 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 - uric acid: 3.2 on 06/06/2021.  No new gout flares appears stable on allopurinol 100 mg and dialysis. No changes today.  Bilateral hip pain - addition of Flexeril 5 mg as needed especially at night added at the last visit.  Hip pain most consistent with greater trochanteric pain syndrome does not appear to be OA. Not bad enough to want to try an injection and already in PT for her shoulder. Try addition flexeril 5 mg in evening. Has had some benefit with the flexeril at night.  Chronic  pain of both shoulders -   Losing some of the left shoulder benefit since injection last year but still fair. Ongoing PT hopefully to improve function but not a surgical candidate. Completed paperwork at this visit today supporting indication for 24/7 aide due to multiple impaired ADLs.  Orders: No orders of the defined types were placed in this encounter.  Meds ordered this encounter  Medications   cyclobenzaprine (FLEXERIL) 5 MG tablet    Sig: Take 1 tablet (5 mg total) by mouth at bedtime as needed.    Dispense:  90 tablet    Refill:  1     Follow-Up Instructions: Return in about 6 months (around 02/21/2023) for Gout/OA f/u 6mos.   Fuller Plan, MD  Note - This record has been created using [employer].  Chart creation errors have been sought, but may not always  have been located. Such creation errors do not reflect on  the standard of medical care.

## 2022-08-21 ENCOUNTER — Encounter: Payer: Self-pay | Admitting: Internal Medicine

## 2022-08-21 ENCOUNTER — Ambulatory Visit: Payer: Medicare Other | Attending: Internal Medicine | Admitting: Internal Medicine

## 2022-08-21 VITALS — BP 86/42 | HR 62 | Resp 14 | Ht 59.0 in | Wt 109.0 lb

## 2022-08-21 DIAGNOSIS — M1A3720 Chronic gout due to renal impairment, left ankle and foot, without tophus (tophi): Secondary | ICD-10-CM | POA: Diagnosis not present

## 2022-08-21 DIAGNOSIS — M25551 Pain in right hip: Secondary | ICD-10-CM | POA: Diagnosis not present

## 2022-08-21 DIAGNOSIS — M25511 Pain in right shoulder: Secondary | ICD-10-CM | POA: Insufficient documentation

## 2022-08-21 DIAGNOSIS — G8929 Other chronic pain: Secondary | ICD-10-CM | POA: Insufficient documentation

## 2022-08-21 DIAGNOSIS — M25552 Pain in left hip: Secondary | ICD-10-CM | POA: Diagnosis present

## 2022-08-21 DIAGNOSIS — M25512 Pain in left shoulder: Secondary | ICD-10-CM | POA: Diagnosis present

## 2022-08-21 DIAGNOSIS — Z5181 Encounter for therapeutic drug level monitoring: Secondary | ICD-10-CM | POA: Insufficient documentation

## 2022-08-21 MED ORDER — CYCLOBENZAPRINE HCL 5 MG PO TABS
5.0000 mg | ORAL_TABLET | Freq: Every evening | ORAL | 1 refills | Status: DC | PRN
Start: 2022-08-21 — End: 2023-04-22

## 2022-08-25 NOTE — Therapy (Signed)
OUTPATIENT PHYSICAL THERAPY TREATMENT NOTE   Patient Name: Tina Watkins MRN: [id number] DOB:1965-11-04, 57 y.o., female Today's Date: 08/26/2022   END OF SESSION:  PT End of Session - 08/26/22 0805     Visit Number 5    Number of Visits 9    Date for PT Re-Evaluation 08/31/22    Authorization Type MCR / MCD    Progress Note Due on Visit 10    PT Start Time 0805    PT Stop Time 0853    PT Time Calculation (min) 48 min    Activity Tolerance Patient tolerated treatment well    Behavior During Therapy [facility] for tasks assessed/performed                 Past Medical History:  Diagnosis Date   Bacteremia due to Gram-negative bacteria 05/23/2011   Blind    right eye   Blind right eye    CHF (congestive heart failure) (HCC)    Chronic lower back pain    Complication of anesthesia    "woke up during OR; I have an extremely high tolerance" (12/11/2011) 1 procedure was graft; the other procedures were procedures that are typically done with sedation.   DDD (degenerative disc disease), cervical    Depression    Dysrhythmia    "tachycardia" (12/11/2011) new onset afib 10/15/14 EKG   E coli bacteremia 06/18/2011   Elevated LDL cholesterol level 12/2018   ESRD (end stage renal disease) (HCC) 06/12/2011   Fibromyalgia    Gastroparesis    Gastroparesis    GERD (gastroesophageal reflux disease)    Glaucoma    right eye   Gout    H/O carpal tunnel syndrome    Headache 10/2019   Headache(784.0)    "not often anymore" (12/11/2011)   Herpes genitalia 1994   History of blood transfusion    "more than a few times" (12/11/2011)   History of stomach ulcers    Hypotension    Iron deficiency anemia    New onset a-fib (HCC)    10/15/14 EKG   Osteopenia    Peripheral neuropathy 11/2018   Pressure ulcer of sacral region, stage 1 07/2019   Seizures (HCC) 1994   "post transplant; only have had that one" (12/11/2011)   Spinal stenosis in cervical region    Stroke [facility])     left  basal ganglia lacunar infarct; Right frontal lobe lacunar infarct.   Stroke [facility]) ~ 1999; 2001   "briefly lost my vision; lost my right eye" (12/11/2011)   Vitamin D deficiency 12/2018   Past Surgical History:  Procedure Laterality Date   ANTERIOR CERVICAL DECOMP/DISCECTOMY FUSION N/A 01/08/2015   Procedure: Anterior Cervical Three-Four/Four-Five Decompression/Diskectomy/Fusion;  Surgeon: Hilda Lias, MD;  Location: [facility];  Service: Neurosurgery;  Laterality: N/A;  C3-4 C4-5 Anterior cervical decompression/diskectomy/fusion   APPENDECTOMY  ~ 2004   BIOPSY  09/12/2020   Procedure: BIOPSY;  Surgeon: Rachael Fee, MD;  Location: [facility] ENDOSCOPY;  Service: Endoscopy;;   CATARACT EXTRACTION     right eye   COLONOSCOPY     COLONOSCOPY WITH PROPOFOL N/A 09/12/2020   Procedure: COLONOSCOPY WITH PROPOFOL;  Surgeon: Rachael Fee, MD;  Location: [facility] ENDOSCOPY;  Service: Endoscopy;  Laterality: N/A;   ENUCLEATION  2001   "right"   ESOPHAGOGASTRODUODENOSCOPY (EGD) WITH PROPOFOL N/A 04/21/2012   Procedure: ESOPHAGOGASTRODUODENOSCOPY (EGD) WITH PROPOFOL;  Surgeon: Rachael Fee, MD;  Location: [facility] ENDOSCOPY;  Service: Endoscopy;  Laterality: N/A;   ESOPHAGOGASTRODUODENOSCOPY (  EGD) WITH PROPOFOL N/A 09/12/2020   Procedure: ESOPHAGOGASTRODUODENOSCOPY (EGD) WITH PROPOFOL;  Surgeon: Rachael Fee, MD;  Location: [facility];  Service: Endoscopy;  Laterality: N/A;   INSERTION OF DIALYSIS CATHETER  1988   "AV graft LUA & LFA; LUA worked for 1 day; LFA never worked"   IR FLUORO GUIDE CV LINE RIGHT  07/11/2019   IR FLUORO GUIDE CV LINE RIGHT  10/20/2019   IR FLUORO GUIDE CV LINE RIGHT  05/31/2020   IR PTA VENOUS EXCEPT DIALYSIS CIRCUIT  05/31/2020   IR RADIOLOGY PERIPHERAL GUIDED IV START  07/11/2019   IR US GUIDE VASC ACCESS RIGHT  07/11/2019   IR US GUIDE VASC ACCESS RIGHT  07/11/2019   IR US GUIDE VASC ACCESS RIGHT  07/11/2019   IR VENOCAVAGRAM IVC  05/31/2020   KIDNEY TRANSPLANT  1994; 1999; 2005    "right"   MULTIPLE TOOTH EXTRACTIONS     POLYPECTOMY  09/12/2020   Procedure: POLYPECTOMY;  Surgeon: Rachael Fee, MD;  Location: [facility] ENDOS[facility];  Service: Endoscopy;;   RIGHT HEART CATH N/A 03/23/2019   Procedure: RIGHT HEART CATH;  Surgeon: Dolores Patty, MD;  Location: [facility];  Service: Cardiovascular;  Laterality: N/A;   TONSILLECTOMY     TOTAL NEPHRECTOMY  1988?; 1994; 2005   Patient Active Problem List   Diagnosis Date Noted   Encounter for monitoring amiodarone therapy 07/01/2022   Atrial fibrillation (HCC) 07/01/2022   Bilateral hip pain 01/14/2022   Prolonged Q-T interval on ECG 09/07/2021   Nausea & vomiting 09/06/2021   Bilateral shoulder pain 06/06/2021   Bilateral hand swelling 06/06/2021   Cervical radiculitis 01/24/2021   Acute respiratory failure with hypoxia (HCC) 10/23/2020   Coagulation defect, unspecified (HCC) 08/01/2020   Aortic atherosclerosis (HCC) 07/10/2020   Hypercoagulable state due to atrial fibrillation (HCC) 04/23/2020   Atrial flutter (HCC) 03/29/2020   Chronic pain of both knees 03/13/2020   Chills (without fever) 12/12/2019   Other pancytopenia (HCC)    Decubitus ulcer of sacral region, stage 1 08/18/2019   HCAP (healthcare-associated pneumonia) 08/05/2019   Pressure injury of skin 07/24/2019   Chest congestion 07/24/2019   Itching 07/24/2019   Weakness 06/13/2019   Pneumonia due to COVID-19 virus 05/09/2019   Hypotension 05/09/2019   Symptomatic anemia 05/09/2019   CHF (congestive heart failure) (HCC) 05/09/2019   Swollen abdomen 01/04/2019   DDD (degenerative disc disease), cervical 12/06/2018   Neuropathy 12/06/2018   Paresthesia 10/11/2018   Neck pain 10/11/2018   Tobacco abuse 11/20/2017   Right leg swelling 11/20/2017   Right leg pain 11/20/2017   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   GERD (gastroesophageal reflux disease) 11/20/2017   Acute venous embolism and thrombosis of deep vessels of proximal lower  extremity, right (HCC) 11/20/2017   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    Thrush, oral    AKI (acute kidney injury) (HCC)    Multifocal pneumonia 08/09/2017   ETD (Eustachian tube dysfunction), bilateral 08/03/2017   Acute recurrent pansinusitis 07/21/2017   Chest pain 09/29/2015   Cervical stenosis of spinal canal 01/08/2015   Urinary tract infectious disease    Muscle spasms of neck 06/15/2014   Bleeding hemorrhoid 06/15/2014   Anemia in chronic kidney disease 06/15/2014   Pyelonephritis, acute 06/13/2014   Sepsis (HCC) 06/13/2014   History of kidney transplant    Abnormal CT scan 09/14/2012   Chronic pain syndrome 04/09/2012   Dehydration, mild 04/09/2012  Herpes infection 06/23/2011   Anxiety 06/23/2011   E coli bacteremia 06/18/2011   ESRD (end stage renal disease) (HCC) 06/12/2011   UTI (lower urinary tract infection) 06/12/2011   Bacteremia due to Gram-negative bacteria 05/23/2011   History of renal transplantation 05/22/2011   Septic shock(785.52) 05/22/2011   Acute on chronic kidney failure (HCC) 05/22/2011   Gastroparesis 04/24/2008   WEIGHT LOSS 08/24/2007   NAUSEA AND VOMITING 08/24/2007    PCP: Ivonne Andrew, NP  REFERRING PROVIDER: Unknown Jim, MD  REFERRING DIAG: Bil adhesive capsulitis   THERAPY DIAG:  Chronic pain of both shoulders  Muscle weakness (generalized)  Rationale for Evaluation and Treatment: Rehabilitation  ONSET DATE: Chronic, ongoing for years   SUBJECTIVE:                                                                                                                                                                                     SUBJECTIVE STATEMENT: Patient reports she is doing alright. States that she has had a hard time sleeping because of the pain, and has had to take several pain pills this week.   PAIN:  Are you having pain? Yes:  NPRS scale: 5/10 Pain location:  Bilateral shoulders Pain description: constant aching Aggravating factors: Cooking, washing dishes, grooming, reaching into cabinet Relieving factors: Rest  PERTINENT HISTORY: History of stroke, DVT, CHF, Afib, Neuropathy, neck pain with ACDF C3-5 in 2016, gout, DDD, ESRD, cervical stenosis, dialysis, kidney transplant (failed), see complex medical history above  PRECAUTIONS: See pertinent medical history  WEIGHT BEARING RESTRICTIONS: No  PATIENT GOALS: Improve shoulder pain and ability to reach overhead, put hair in ponytail   OBJECTIVE:  PATIENT SURVEYS:  FOTO 56 % functional status  POSTURE: Rounded shoulder and forward head posture  UPPER EXTREMITY ROM:   ROM Right eval Left eval Rt / Lt 08/12/2022 Rt / Lt 08/17/2022  Shoulder flexion 90 100 105 / 100 95 / 100  Shoulder extension      Shoulder abduction 65 90    Shoulder adduction      Shoulder internal rotation Reach to lateral iliac crest Reach to PSIS    Shoulder external rotation Unable to reach to head Reach to occiput    Elbow flexion      Elbow extension      Wrist flexion      Wrist extension      Wrist ulnar deviation      Wrist radial deviation      Wrist pronation      Wrist supination      (Blank rows = not tested)  UPPER EXTREMITY MMT:    Strength assessment  within available range  MMT Right eval Left eval  Shoulder flexion 3 3  Shoulder extension    Shoulder abduction 3 3  Shoulder adduction    Shoulder internal rotation    Shoulder external rotation    Middle trapezius    Lower trapezius    Elbow flexion    Elbow extension    Wrist flexion    Wrist extension    Wrist ulnar deviation    Wrist radial deviation    Wrist pronation    Wrist supination    Grip strength (lbs)    (Blank rows = not tested)  JOINT MOBILITY TESTING:  Not assessed  PALPATION:  Generalized tenderness of bilateral shoulders    TODAY'S TREATMENT:      [facility] Adult PT Treatment:                                                 DATE: 08/26/22 Therapeutic Exercise: Nustep L5 x 5 min with UE/LE while taking subjective, working on shoulder motion Seated overhead pulley for shoulder elevation x 4 min Seated shoulder flexion stretch with physioball 10 x 5 sec Standing row with red 2 x 10 Standing finger ladder x 5 each Reclined on wedge with pillows: Chest press with dowel to overhead reach 2 x 10 Double ER with yellow 2 x 10 more of an isometric Shoulder flexion dowel x 5 partial range due to pain Modalities: MHP applied to shoulders x 10 min post session   [facility] Adult PT Treatment:                                                DATE: 08/17/22 Therapeutic Exercise: Nustep L5 x 5 min with UE/LE while taking subjective, working on shoulder motion Seated overhead pulley for shoulder elevation x 4 min Seated shoulder flexion stretch with physioball 10 x 5 sec Standing row with red 2 x 10 Standing finger ladder x 3 each Reclined on wedge with pillows: Chest press with dowel 2 x 10 Shoulder flexion dowel 2 x 5 partial range due to pain Double ER with yellow 2 x 10 more of an isometric Modalities: MHP applied to shoulders x 10 min post session  [facility] Adult PT Treatment:                                                DATE: 08/12/22 Therapeutic Exercise: Nustep L3 x 8 min with UE/LE while taking subjective, working on shoulder motion Seated overhead pulley for shoulder elevation x 4 min Seated row with red 2 x 10 Seated shoulder flexion stretch with physioball 5 x 5 sec Reclined on wedge with pillows: Chest press with dowel 2 x 10 Shoulder flexion dowel 2 x 5 partial range due to pain Double ER with yellow 2 x 10 more of an isometric Bilateral elbow flexion with 1# in supination 2 x 10 Modalities: MHP applied to shoulders x 10 min post session  PATIENT EDUCATION: Education details: HEP Person educated: Patient Education method: Programmer, multimedia, Demonstration, Actor cues, Verbal cues Education  comprehension: verbalized understanding, returned demonstration, verbal  cues required, tactile cues required, and needs further education  HOME EXERCISE PROGRAM: Access Code: [email or web address]    ASSESSMENT: CLINICAL IMPRESSION: Patient with fair tolerance for therapy, no adverse effects reported. Therapy continues to focus on progressing shoulder active motion and strengthening. She continues to report pain with all exercise but able to complete. She does seem to exhibit improved passive shoulder motion with pulleys and finger ladder, but remains limited with active motion. No changes made to HEP this visit. Patient would benefit from continued skilled PT to progress her shoulder mobility and strength in order to reduce pain and maximize functional ability.   OBJECTIVE IMPAIRMENTS: decreased activity tolerance, decreased ROM, decreased strength, postural dysfunction, and pain.   ACTIVITY LIMITATIONS: carrying, lifting, bathing, dressing, reach over head, and hygiene/grooming  PARTICIPATION LIMITATIONS: meal prep, cleaning, laundry, shopping, and community activity  PERSONAL FACTORS: Fitness, Past/current experiences, Social background, Time since onset of injury/illness/exacerbation, and 3+ comorbidities: see pertinent medical history above  are also affecting patient's functional outcome.    GOALS: Goals reviewed with patient? Yes  SHORT TERM GOALS: Target date: 08/03/2022  Patient will be I with initial HEP in order to progress with therapy. Baseline: HEP provided at eval 08/12/2022: progressing Goal status: ONGOING  2.  Patient will report bilateral shoulder pain </= 3/10 in order to reduce functional limitations Baseline: patient reports shoulder pain 6/10 08/12/2022: patient reports shoulder pain 3/10 Goal status: MET  LONG TERM GOALS: Target date: 08/31/2022  Patient will be I with final HEP to maintain progress from PT. Baseline: HEP provided at eval Goal status: INITIAL  2.   Patient will report >/= 63% status on FOTO to indicate improved functional ability. Baseline: 56% functional status Goal status: INITIAL  3.  Patient will demonstrate bilateral shoulder AROM flexion >/= 110 deg in order to improve reach into low shelf Baseline: shoulder AROM flexion 90 deg Goal status: INITIAL  4.  Patient will be able to reach to occiput with right arm to demonstrate improved functional ER and ability to put her hair in a ponytail Baseline: patient unable to perform due to pain and limited motion Goal status: INITIAL   PLAN: PT FREQUENCY: 1x/week  PT DURATION: 8 weeks  PLANNED INTERVENTIONS: Therapeutic exercises, Therapeutic activity, Neuromuscular re-education, Balance training, Gait training, Patient/Family education, Self Care, Joint mobilization, Aquatic Therapy, Dry Needling, Cryotherapy, Moist heat, Manual therapy, and Re-evaluation  PLAN FOR NEXT SESSION: Review HEP and progress PRN, manual/stretching for bilateral shoulders as tolerated, progressive strengthening and ROM exercises   Hilbert Bible PT 08/26/22  8:50 AM Phone: [phone] Fax: [phone]

## 2022-08-26 ENCOUNTER — Encounter: Payer: Self-pay | Admitting: Physical Therapy

## 2022-08-26 ENCOUNTER — Ambulatory Visit: Payer: Medicare Other | Admitting: Physical Therapy

## 2022-08-26 ENCOUNTER — Other Ambulatory Visit: Payer: Self-pay

## 2022-08-26 DIAGNOSIS — M25511 Pain in right shoulder: Secondary | ICD-10-CM | POA: Diagnosis not present

## 2022-08-26 DIAGNOSIS — G8929 Other chronic pain: Secondary | ICD-10-CM

## 2022-08-26 DIAGNOSIS — M6281 Muscle weakness (generalized): Secondary | ICD-10-CM

## 2022-08-28 ENCOUNTER — Other Ambulatory Visit (HOSPITAL_COMMUNITY): Payer: Self-pay | Admitting: Internal Medicine

## 2022-08-31 ENCOUNTER — Other Ambulatory Visit: Payer: Self-pay

## 2022-08-31 ENCOUNTER — Other Ambulatory Visit: Payer: Self-pay | Admitting: Nurse Practitioner

## 2022-08-31 MED ORDER — ALLOPURINOL 100 MG PO TABS: 100.0000 mg | ORAL_TABLET | Freq: Every day | ORAL | 3 refills | Status: DC

## 2022-08-31 NOTE — Telephone Encounter (Signed)
Please advise KH 

## 2022-08-31 NOTE — Telephone Encounter (Signed)
Caller & Relationship to patient:  MRN #  [id number]   Call Back Number:   Date of Last Office Visit: 06/25/2022     Date of Next Office Visit: 09/14/2022    Medication(s) to be Refilled: allopurinol  Preferred Pharmacy: [facility] on [address]  ** Please notify patient to allow 48-72 hours to process** **Let patient know to contact pharmacy at the end of the day to make sure medication is ready. ** **If patient has not been seen in a year or longer, book an appointment **Advise to use MyChart for refill requests OR to contact their pharmacy

## 2022-09-02 ENCOUNTER — Ambulatory Visit: Payer: Medicare Other | Admitting: Physical Therapy

## 2022-09-09 ENCOUNTER — Encounter: Payer: Self-pay | Admitting: Physical Therapy

## 2022-09-09 ENCOUNTER — Ambulatory Visit: Payer: Medicare Other | Attending: Family Medicine | Admitting: Physical Therapy

## 2022-09-09 ENCOUNTER — Ambulatory Visit: Payer: Medicare Other | Admitting: Physical Therapy

## 2022-09-09 DIAGNOSIS — M25511 Pain in right shoulder: Secondary | ICD-10-CM | POA: Diagnosis present

## 2022-09-09 DIAGNOSIS — M25612 Stiffness of left shoulder, not elsewhere classified: Secondary | ICD-10-CM | POA: Diagnosis present

## 2022-09-09 DIAGNOSIS — G8929 Other chronic pain: Secondary | ICD-10-CM | POA: Insufficient documentation

## 2022-09-09 DIAGNOSIS — M25512 Pain in left shoulder: Secondary | ICD-10-CM | POA: Insufficient documentation

## 2022-09-09 DIAGNOSIS — M6281 Muscle weakness (generalized): Secondary | ICD-10-CM | POA: Insufficient documentation

## 2022-09-09 NOTE — Therapy (Signed)
Progress Note Reporting Period 6/3 to 8/7  See note below for Objective Data and Assessment of Progress/Goals.      Patient Name: Tina Watkins MRN: [id number] DOB:06/18/1965, 57 y.o., female Today's Date: 09/09/2022   END OF SESSION:  PT End of Session - 09/09/22 1539     Visit Number 6    Number of Visits --    Date for PT Re-Evaluation 11/04/22    Authorization Type MCR / MCD    Progress Note Due on Visit 10    PT Start Time 1538    PT Stop Time 1617    PT Time Calculation (min) 39 min    Activity Tolerance Patient tolerated treatment well    Behavior During Therapy [facility] for tasks assessed/performed                 Past Medical History:  Diagnosis Date   Bacteremia due to Gram-negative bacteria 05/23/2011   Blind    right eye   Blind right eye    CHF (congestive heart failure) (HCC)    Chronic lower back pain    Complication of anesthesia    "woke up during OR; I have an extremely high tolerance" (12/11/2011) 1 procedure was graft; the other procedures were procedures that are typically done with sedation.   DDD (degenerative disc disease), cervical    Depression    Dysrhythmia    "tachycardia" (12/11/2011) new onset afib 10/15/14 EKG   E coli bacteremia 06/18/2011   Elevated LDL cholesterol level 12/2018   ESRD (end stage renal disease) (HCC) 06/12/2011   Fibromyalgia    Gastroparesis    Gastroparesis    GERD (gastroesophageal reflux disease)    Glaucoma    right eye   Gout    H/O carpal tunnel syndrome    Headache 10/2019   Headache(784.0)    "not often anymore" (12/11/2011)   Herpes genitalia 1994   History of blood transfusion    "more than a few times" (12/11/2011)   History of stomach ulcers    Hypotension    Iron deficiency anemia    New onset a-fib (HCC)    10/15/14 EKG   Osteopenia    Peripheral neuropathy 11/2018   Pressure ulcer of sacral region, stage 1 07/2019   Seizures (HCC) 1994   "post transplant; only have had that one"  (12/11/2011)   Spinal stenosis in cervical region    Stroke [facility])     left basal ganglia lacunar infarct; Right frontal lobe lacunar infarct.   Stroke [facility]) ~ 1999; 2001   "briefly lost my vision; lost my right eye" (12/11/2011)   Vitamin D deficiency 12/2018   Past Surgical History:  Procedure Laterality Date   ANTERIOR CERVICAL DECOMP/DISCECTOMY FUSION N/A 01/08/2015   Procedure: Anterior Cervical Three-Four/Four-Five Decompression/Diskectomy/Fusion;  Surgeon: Hilda Lias, MD;  Location: [facility] NEURO ORS;  Service: Neurosurgery;  Laterality: N/A;  C3-4 C4-5 Anterior cervical decompression/diskectomy/fusion   APPENDECTOMY  ~ 2004   BIOPSY  09/12/2020   Procedure: BIOPSY;  Surgeon: Rachael Fee, MD;  Location: [facility] ENDOSCOPY;  Service: Endoscopy;;   CATARACT EXTRACTION     right eye   COLONOSCOPY     COLONOSCOPY WITH PROPOFOL N/A 09/12/2020   Procedure: COLONOSCOPY WITH PROPOFOL;  Surgeon: Rachael Fee, MD;  Location: [facility] ENDOSCOPY;  Service: Endoscopy;  Laterality: N/A;   ENUCLEATION  2001   "right"   ESOPHAGOGASTRODUODENOSCOPY (EGD) WITH PROPOFOL N/A 04/21/2012   Procedure: ESOPHAGOGASTRODUODENOSCOPY (EGD) WITH PROPOFOL;  Surgeon: Reuel Boom  Marye Round, MD;  Location: [facility];  Service: Endoscopy;  Laterality: N/A;   ESOPHAGOGASTRODUODENOSCOPY (EGD) WITH PROPOFOL N/A 09/12/2020   Procedure: ESOPHAGOGASTRODUODENOSCOPY (EGD) WITH PROPOFOL;  Surgeon: Rachael Fee, MD;  Location: [facility] ENDOSCOPY;  Service: Endoscopy;  Laterality: N/A;   INSERTION OF DIALYSIS CATHETER  1988   "AV graft LUA & LFA; LUA worked for 1 day; LFA never worked"   IR FLUORO GUIDE CV LINE RIGHT  07/11/2019   IR FLUORO GUIDE CV LINE RIGHT  10/20/2019   IR FLUORO GUIDE CV LINE RIGHT  05/31/2020   IR PTA VENOUS EXCEPT DIALYSIS CIRCUIT  05/31/2020   IR RADIOLOGY PERIPHERAL GUIDED IV START  07/11/2019   IR US GUIDE VASC ACCESS RIGHT  07/11/2019   IR US GUIDE VASC ACCESS RIGHT  07/11/2019   IR US GUIDE VASC ACCESS RIGHT  07/11/2019    IR VENOCAVAGRAM IVC  05/31/2020   KIDNEY TRANSPLANT  1994; 1999; 2005   "right"   MULTIPLE TOOTH EXTRACTIONS     POLYPECTOMY  09/12/2020   Procedure: POLYPECTOMY;  Surgeon: Rachael Fee, MD;  Location: [facility] ENDOSCOPY;  Service: Endoscopy;;   RIGHT HEART CATH N/A 03/23/2019   Procedure: RIGHT HEART CATH;  Surgeon: Dolores Patty, MD;  Location: [facility] INVASIVE CV LAB;  Service: Cardiovascular;  Laterality: N/A;   TONSILLECTOMY     TOTAL NEPHRECTOMY  1988?; 1994; 2005   Patient Active Problem List   Diagnosis Date Noted   Encounter for monitoring amiodarone therapy 07/01/2022   Atrial fibrillation (HCC) 07/01/2022   Bilateral hip pain 01/14/2022   Prolonged Q-T interval on ECG 09/07/2021   Nausea & vomiting 09/06/2021   Bilateral shoulder pain 06/06/2021   Bilateral hand swelling 06/06/2021   Cervical radiculitis 01/24/2021   Acute respiratory failure with hypoxia (HCC) 10/23/2020   Coagulation defect, unspecified (HCC) 08/01/2020   Aortic atherosclerosis (HCC) 07/10/2020   Hypercoagulable state due to atrial fibrillation (HCC) 04/23/2020   Atrial flutter (HCC) 03/29/2020   Chronic pain of both knees 03/13/2020   Chills (without fever) 12/12/2019   Other pancytopenia (HCC)    Decubitus ulcer of sacral region, stage 1 08/18/2019   HCAP (healthcare-associated pneumonia) 08/05/2019   Pressure injury of skin 07/24/2019   Chest congestion 07/24/2019   Itching 07/24/2019   Weakness 06/13/2019   Pneumonia due to COVID-19 virus 05/09/2019   Hypotension 05/09/2019   Symptomatic anemia 05/09/2019   CHF (congestive heart failure) (HCC) 05/09/2019   Swollen abdomen 01/04/2019   DDD (degenerative disc disease), cervical 12/06/2018   Neuropathy 12/06/2018   Paresthesia 10/11/2018   Neck pain 10/11/2018   Tobacco abuse 11/20/2017   Right leg swelling 11/20/2017   Right leg pain 11/20/2017   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   GERD (gastroesophageal reflux disease) 11/20/2017    Acute venous embolism and thrombosis of deep vessels of proximal lower extremity, right (HCC) 11/20/2017   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    Thrush, oral    AKI (acute kidney injury) (HCC)    Multifocal pneumonia 08/09/2017   ETD (Eustachian tube dysfunction), bilateral 08/03/2017   Acute recurrent pansinusitis 07/21/2017   Chest pain 09/29/2015   Cervical stenosis of spinal canal 01/08/2015   Urinary tract infectious disease    Muscle spasms of neck 06/15/2014   Bleeding hemorrhoid 06/15/2014   Anemia in chronic kidney disease 06/15/2014   Pyelonephritis, acute 06/13/2014   Sepsis (HCC) 06/13/2014   History of kidney transplant  Abnormal CT scan 09/14/2012   Chronic pain syndrome 04/09/2012   Dehydration, mild 04/09/2012   Herpes infection 06/23/2011   Anxiety 06/23/2011   E coli bacteremia 06/18/2011   ESRD (end stage renal disease) (HCC) 06/12/2011   UTI (lower urinary tract infection) 06/12/2011   Bacteremia due to Gram-negative bacteria 05/23/2011   History of renal transplantation 05/22/2011   Septic shock(785.52) 05/22/2011   Acute on chronic kidney failure (HCC) 05/22/2011   Gastroparesis 04/24/2008   WEIGHT LOSS 08/24/2007   NAUSEA AND VOMITING 08/24/2007    PCP: Ivonne Andrew, NP  REFERRING PROVIDER: Unknown Jim, MD  REFERRING DIAG: Bil adhesive capsulitis   THERAPY DIAG:  Chronic pain of both shoulders  Muscle weakness (generalized)  Chronic left shoulder pain  Chronic right shoulder pain  Stiffness of left shoulder, not elsewhere classified  Rationale for Evaluation and Treatment: Rehabilitation  ONSET DATE: Chronic, ongoing for years   SUBJECTIVE:                                                                                                                                                                                     SUBJECTIVE STATEMENT: Pt reports that she thinks the R  shoulder injection is wearing off.  She feel her ROM is improved but continues to have high levels of pain.   PAIN:  Are you having pain? Yes:  NPRS scale: 5/10 Pain location: Bilateral shoulders Pain description: constant aching Aggravating factors: Cooking, washing dishes, grooming, reaching into cabinet Relieving factors: Rest  PERTINENT HISTORY: History of stroke, DVT, CHF, Afib, Neuropathy, neck pain with ACDF C3-5 in 2016, gout, DDD, ESRD, cervical stenosis, dialysis, kidney transplant (failed), see complex medical history above  PRECAUTIONS: See pertinent medical history  WEIGHT BEARING RESTRICTIONS: No  PATIENT GOALS: Improve shoulder pain and ability to reach overhead, put hair in ponytail   OBJECTIVE:  PATIENT SURVEYS:  FOTO 56 % functional status  POSTURE: Rounded shoulder and forward head posture  UPPER EXTREMITY ROM:   ROM Right eval Left eval Rt / Lt 08/12/2022 Rt / Lt 08/17/2022 R/L 8/7  Shoulder flexion 90 100 105 / 100 95 / 100 90/115  Shoulder extension       Shoulder abduction 65 90     Shoulder adduction       Shoulder internal rotation Reach to lateral iliac crest Reach to PSIS     Shoulder external rotation Unable to reach to head Reach to occiput     Elbow flexion       Elbow extension       Wrist flexion       Wrist extension  Wrist ulnar deviation       Wrist radial deviation       Wrist pronation       Wrist supination       (Blank rows = not tested)  UPPER EXTREMITY MMT:    Strength assessment within available range  MMT Right eval Left eval  Shoulder flexion 3 3  Shoulder extension    Shoulder abduction 3 3  Shoulder adduction    Shoulder internal rotation    Shoulder external rotation    Middle trapezius    Lower trapezius    Elbow flexion    Elbow extension    Wrist flexion    Wrist extension    Wrist ulnar deviation    Wrist radial deviation    Wrist pronation    Wrist supination    Grip strength (lbs)     (Blank rows = not tested)  JOINT MOBILITY TESTING:  Not assessed  PALPATION:  Generalized tenderness of bilateral shoulders    TODAY'S TREATMENT:    [facility] Adult PT Treatment:                                                DATE: 8/7 Therapeutic Exercise: Nustep L5 x 5 min with UE/LE while taking subjective, working on shoulder motion Seated overhead pulley for shoulder elevation x 4 min Seated shoulder flexion stretch with physioball 10 x 5 sec Standing row with red 2 x 10 Standing finger ladder x 5 each Reclined on wedge with pillows: Chest press with dowel to overhead reach 2 x 10 Double ER with yellow 2 x 10 more of an isometric Shoulder flexion dowel x 5 partial range due to pain  Therapeutic Activity - collecting information for goals, checking progress, and reviewing with patient  Modalities: MHP applied to shoulders x 10 min post session  [facility] Adult PT Treatment:                                                DATE: 08/26/22 Therapeutic Exercise: Nustep L5 x 5 min with UE/LE while taking subjective, working on shoulder motion Seated overhead pulley for shoulder elevation x 4 min Seated shoulder flexion stretch with physioball 10 x 5 sec Standing row with red 2 x 10 Standing finger ladder x 5 each Reclined on wedge with pillows: Chest press with dowel to overhead reach 2 x 10 Double ER with yellow 2 x 10 more of an isometric Shoulder flexion dowel x 5 partial range due to pain Modalities: MHP applied to shoulders x 10 min post session     [facility] Adult PT Treatment:                                                DATE: 08/26/22 Therapeutic Exercise: Nustep L5 x 5 min with UE/LE while taking subjective, working on shoulder motion Seated overhead pulley for shoulder elevation x 4 min Seated shoulder flexion stretch with physioball 10 x 5 sec Standing row with red 2 x 10 Standing finger ladder x 5 each Reclined on wedge with pillows: Chest press with  dowel to overhead  reach 2 x 10 Double ER with yellow 2 x 10 more of an isometric Shoulder flexion dowel x 5 partial range due to pain Modalities: MHP applied to shoulders x 10 min post session   [facility] Adult PT Treatment:                                                DATE: 08/17/22 Therapeutic Exercise: Nustep L5 x 5 min with UE/LE while taking subjective, working on shoulder motion Seated overhead pulley for shoulder elevation x 4 min Seated shoulder flexion stretch with physioball 10 x 5 sec Standing row with red 2 x 10 Standing finger ladder x 3 each Reclined on wedge with pillows: Chest press with dowel 2 x 10 Shoulder flexion dowel 2 x 5 partial range due to pain Double ER with yellow 2 x 10 more of an isometric Modalities: MHP applied to shoulders x 10 min post session  [facility] Adult PT Treatment:                                                DATE: 08/12/22 Therapeutic Exercise: Nustep L3 x 8 min with UE/LE while taking subjective, working on shoulder motion Seated overhead pulley for shoulder elevation x 4 min Seated row with red 2 x 10 Seated shoulder flexion stretch with physioball 5 x 5 sec Reclined on wedge with pillows: Chest press with dowel 2 x 10 Shoulder flexion dowel 2 x 5 partial range due to pain Double ER with yellow 2 x 10 more of an isometric Bilateral elbow flexion with 1# in supination 2 x 10 Modalities: MHP applied to shoulders x 10 min post session  PATIENT EDUCATION: Education details: HEP Person educated: Patient Education method: Programmer, multimedia, Demonstration, Actor cues, Verbal cues Education comprehension: verbalized understanding, returned demonstration, verbal cues required, tactile cues required, and needs further education  HOME EXERCISE PROGRAM: Access Code: [email or web address]    ASSESSMENT: CLINICAL IMPRESSION: Brayanna presents with increased R shoulder pain today.  Despite increased R shoulder pain, she has shown progress with ability to reach her head in order to  do her hair.  She also reports improvement in her ability to complete tasks below shoulder level such as applying deodorant.  She has shown a significant improvement in her L shoulder flexion ROM since eval.  Since she has only completed 5 visits in her original plan, I will extend her POC to see if additional progress with R shoulder ROM and strength is possible.   OBJECTIVE IMPAIRMENTS: decreased activity tolerance, decreased ROM, decreased strength, postural dysfunction, and pain.   ACTIVITY LIMITATIONS: carrying, lifting, bathing, dressing, reach over head, and hygiene/grooming  PARTICIPATION LIMITATIONS: meal prep, cleaning, laundry, shopping, and community activity  PERSONAL FACTORS: Fitness, Past/current experiences, Social background, Time since onset of injury/illness/exacerbation, and 3+ comorbidities: see pertinent medical history above  are also affecting patient's functional outcome.    GOALS: Goals reviewed with patient? Yes  SHORT TERM GOALS: Target date: 08/03/2022  Patient will be I with initial HEP in order to progress with therapy. Baseline: HEP provided at eval 08/12/2022: progressing Goal status: ONGOING  2.  Patient will report bilateral shoulder pain </= 3/10 in order to reduce functional limitations  Baseline: patient reports shoulder pain 6/10 08/12/2022: patient reports shoulder pain 3/10 Goal status: MET  LONG TERM GOALS: Target date: 08/31/2022 extended to 10/07/2022   Patient will be I with final HEP to maintain progress from PT. Baseline: HEP provided at eval 8/7: independent Goal status: Ongoing  2.  Patient will report >/= 63% status on FOTO to indicate improved functional ability. Baseline: 56% functional status 8/7: 53 Goal status: IN PROGRESS  3.  Patient will demonstrate bilateral shoulder AROM flexion >/= 110 deg in order to improve reach into low shelf Baseline: shoulder AROM flexion 90 deg 8/7: R: 90, L 115 Goal status: IN PROGRESS  4.   Patient will be able to reach to occiput with right arm to demonstrate improved functional ER and ability to put her hair in a ponytail Baseline: patient unable to perform due to pain and limited motion 8/7: Top of head with significant pain Goal status: ongoing   PLAN: PT FREQUENCY: 1x/week  PT DURATION: 8 weeks  PLANNED INTERVENTIONS: Therapeutic exercises, Therapeutic activity, Neuromuscular re-education, Balance training, Gait training, Patient/Family education, Self Care, Joint mobilization, Aquatic Therapy, Dry Needling, Cryotherapy, Moist heat, Manual therapy, and Re-evaluation  PLAN FOR NEXT SESSION: Review HEP and progress PRN, manual/stretching for bilateral shoulders as tolerated, progressive strengthening and ROM exercises   Fredderick Phenix PT 09/09/22  4:25 PM Phone: [phone] Fax: [phone]

## 2022-09-14 ENCOUNTER — Ambulatory Visit: Payer: Self-pay | Admitting: Nurse Practitioner

## 2022-09-14 ENCOUNTER — Ambulatory Visit
Admission: EM | Admit: 2022-09-14 | Discharge: 2022-09-14 | Disposition: A | Discharge status: Home / Self Care | Payer: Medicare Other

## 2022-09-14 ENCOUNTER — Ambulatory Visit: Payer: Medicare Other | Admitting: Physical Therapy

## 2022-09-14 DIAGNOSIS — U071 COVID-19: Secondary | ICD-10-CM | POA: Diagnosis not present

## 2022-09-14 NOTE — ED Provider Notes (Signed)
[facility]    CSN: [id number] Arrival date & time: 09/14/22  1042      History   Chief Complaint Chief Complaint  Patient presents with   COVID19    + at home Test.    HPI Tina Watkins is a 57 y.o. female.   Patient here today for evaluation of cough, congestion, headache and chills after testing positive for COVID 4 days ago.  She reports that she has had bodyaches as well.  She has not any fever.  She states that symptoms seem to have improved somewhat since onset.  The history is provided by the patient.    Past Medical History:  Diagnosis Date   Bacteremia due to Gram-negative bacteria 05/23/2011   Blind    right eye   Blind right eye    CHF (congestive heart failure) (HCC)    Chronic lower back pain    Complication of anesthesia    "woke up during OR; I have an extremely high tolerance" (12/11/2011) 1 procedure was graft; the other procedures were procedures that are typically done with sedation.   DDD (degenerative disc disease), cervical    Depression    Dysrhythmia    "tachycardia" (12/11/2011) new onset afib 10/15/14 EKG   E coli bacteremia 06/18/2011   Elevated LDL cholesterol level 12/2018   ESRD (end stage renal disease) (HCC) 06/12/2011   Fibromyalgia    Gastroparesis    Gastroparesis    GERD (gastroesophageal reflux disease)    Glaucoma    right eye   Gout    H/O carpal tunnel syndrome    Headache 10/2019   Headache(784.0)    "not often anymore" (12/11/2011)   Herpes genitalia 1994   History of blood transfusion    "more than a few times" (12/11/2011)   History of stomach ulcers    Hypotension    Iron deficiency anemia    New onset a-fib (HCC)    10/15/14 EKG   Osteopenia    Peripheral neuropathy 11/2018   Pressure ulcer of sacral region, stage 1 07/2019   Seizures (HCC) 1994   "post transplant; only have had that one" (12/11/2011)   Spinal stenosis in cervical region    Stroke [facility])     left basal ganglia lacunar infarct;  Right frontal lobe lacunar infarct.   Stroke [facility]) ~ 1999; 2001   "briefly lost my vision; lost my right eye" (12/11/2011)   Vitamin D deficiency 12/2018    Patient Active Problem List   Diagnosis Date Noted   Encounter for monitoring amiodarone therapy 07/01/2022   Atrial fibrillation (HCC) 07/01/2022   Bilateral hip pain 01/14/2022   Prolonged Q-T interval on ECG 09/07/2021   Nausea & vomiting 09/06/2021   Bilateral shoulder pain 06/06/2021   Bilateral hand swelling 06/06/2021   Cervical radiculitis 01/24/2021   Acute respiratory failure with hypoxia (HCC) 10/23/2020   Coagulation defect, unspecified (HCC) 08/01/2020   Aortic atherosclerosis (HCC) 07/10/2020   Hypercoagulable state due to atrial fibrillation (HCC) 04/23/2020   Atrial flutter (HCC) 03/29/2020   Chronic pain of both knees 03/13/2020   Chills (without fever) 12/12/2019   Other pancytopenia (HCC)    Decubitus ulcer of sacral region, stage 1 08/18/2019   HCAP (healthcare-associated pneumonia) 08/05/2019   Pressure injury of skin 07/24/2019   Chest congestion 07/24/2019   Itching 07/24/2019   Weakness 06/13/2019   Pneumonia due to COVID-19 virus 05/09/2019   Hypotension 05/09/2019   Symptomatic anemia 05/09/2019   CHF (congestive  heart failure) (HCC) 05/09/2019   Swollen abdomen 01/04/2019   DDD (degenerative disc disease), cervical 12/06/2018   Neuropathy 12/06/2018   Paresthesia 10/11/2018   Neck pain 10/11/2018   Tobacco abuse 11/20/2017   Right leg swelling 11/20/2017   Right leg pain 11/20/2017   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   GERD (gastroesophageal reflux disease) 11/20/2017   Acute venous embolism and thrombosis of deep vessels of proximal lower extremity, right (HCC) 11/20/2017   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    Thrush, oral    AKI (acute kidney injury) (HCC)    Multifocal pneumonia 08/09/2017   ETD (Eustachian tube dysfunction), bilateral  08/03/2017   Acute recurrent pansinusitis 07/21/2017   Chest pain 09/29/2015   Cervical stenosis of spinal canal 01/08/2015   Urinary tract infectious disease    Muscle spasms of neck 06/15/2014   Bleeding hemorrhoid 06/15/2014   Anemia in chronic kidney disease 06/15/2014   Pyelonephritis, acute 06/13/2014   Sepsis (HCC) 06/13/2014   History of kidney transplant    Abnormal CT scan 09/14/2012   Chronic pain syndrome 04/09/2012   Dehydration, mild 04/09/2012   Herpes infection 06/23/2011   Anxiety 06/23/2011   E coli bacteremia 06/18/2011   ESRD (end stage renal disease) (HCC) 06/12/2011   UTI (lower urinary tract infection) 06/12/2011   Bacteremia due to Gram-negative bacteria 05/23/2011   History of renal transplantation 05/22/2011   Septic shock(785.52) 05/22/2011   Acute on chronic kidney failure (HCC) 05/22/2011   Gastroparesis 04/24/2008   WEIGHT LOSS 08/24/2007   NAUSEA AND VOMITING 08/24/2007    Past Surgical History:  Procedure Laterality Date   ANTERIOR CERVICAL DECOMP/DISCECTOMY FUSION N/A 01/08/2015   Procedure: Anterior Cervical Three-Four/Four-Five Decompression/Diskectomy/Fusion;  Surgeon: Hilda Lias, MD;  Location: [facility];  Service: Neurosurgery;  Laterality: N/A;  C3-4 C4-5 Anterior cervical decompression/diskectomy/fusion   APPENDECTOMY  ~ 2004   BIOPSY  09/12/2020   Procedure: BIOPSY;  Surgeon: Rachael Fee, MD;  Location: [facility];  Service: Endoscopy;;   CATARACT EXTRACTION     right eye   COLONOSCOPY     COLONOSCOPY WITH PROPOFOL N/A 09/12/2020   Procedure: COLONOSCOPY WITH PROPOFOL;  Surgeon: Rachael Fee, MD;  Location: [facility];  Service: Endoscopy;  Laterality: N/A;   ENUCLEATION  2001   "right"   ESOPHAGOGASTRODUODENOSCOPY (EGD) WITH PROPOFOL N/A 04/21/2012   Procedure: ESOPHAGOGASTRODUODENOSCOPY (EGD) WITH PROPOFOL;  Surgeon: Rachael Fee, MD;  Location: [facility];  Service: Endoscopy;  Laterality: N/A;    ESOPHAGOGASTRODUODENOSCOPY (EGD) WITH PROPOFOL N/A 09/12/2020   Procedure: ESOPHAGOGASTRODUODENOSCOPY (EGD) WITH PROPOFOL;  Surgeon: Rachael Fee, MD;  Location: [facility];  Service: Endoscopy;  Laterality: N/A;   INSERTION OF DIALYSIS CATHETER  1988   "AV graft LUA & LFA; LUA worked for 1 day; LFA never worked"   IR FLUORO GUIDE CV LINE RIGHT  07/11/2019   IR FLUORO GUIDE CV LINE RIGHT  10/20/2019   IR FLUORO GUIDE CV LINE RIGHT  05/31/2020   IR PTA VENOUS EXCEPT DIALYSIS CIRCUIT  05/31/2020   IR RADIOLOGY PERIPHERAL GUIDED IV START  07/11/2019   IR US GUIDE VASC ACCESS RIGHT  07/11/2019   IR US GUIDE VASC ACCESS RIGHT  07/11/2019   IR US GUIDE VASC ACCESS RIGHT  07/11/2019   IR VENOCAVAGRAM IVC  05/31/2020   KIDNEY TRANSPLANT  1994; 1999; 2005   "right"   MULTIPLE TOOTH EXTRACTIONS     POLYPECTOMY  09/12/2020  Procedure: POLYPECTOMY;  Surgeon: Rachael Fee, MD;  Location: [facility];  Service: Endoscopy;;   RIGHT HEART CATH N/A 03/23/2019   Procedure: RIGHT HEART CATH;  Surgeon: Dolores Patty, MD;  Location: [facility] INVASIVE CV LAB;  Service: Cardiovascular;  Laterality: N/A;   TONSILLECTOMY     TOTAL NEPHRECTOMY  1988?; 1994; 2005    OB History     Gravida  1   Para      Term      Preterm      AB  1   Living         SAB  1   IAB      Ectopic      Multiple      Live Births               Home Medications    Prior to Admission medications   Medication Sig Start Date End Date Taking? Authorizing Provider  allopurinol (ZYLOPRIM) 100 MG tablet Take 1 tablet (100 mg total) by mouth daily. 08/31/22  Yes Ivonne Andrew, NP  amiodarone (PACERONE) 200 MG tablet Take 0.5 tablets (100 mg total) by mouth daily. 07/03/22  Yes Eustace Pen, PA-C  apixaban (ELIQUIS) 2.5 MG TABS tablet TAKE 1 TABLET BY MOUTH TWICE A DAY 08/28/22  Yes Bensimhon, Bevelyn Buckles, MD  Colchicine 0.6 MG CAPS Take 0.6 mg by mouth daily as needed (gout flare up). 01/12/22  Yes Ivonne Andrew, NP   cyclobenzaprine (FLEXERIL) 5 MG tablet Take 1 tablet (5 mg total) by mouth at bedtime as needed. 08/21/22  Yes Rice, Jamesetta Orleans, MD  dorzolamide-timolol (COSOPT) 2-0.5 % ophthalmic solution Place 1 drop into the left eye 2 (two) times daily. 12/13/21  Yes [provider]  escitalopram (LEXAPRO) 20 MG tablet Take 20 mg by mouth daily. 03/21/21  Yes [provider]  famotidine (PEPCID) 20 MG tablet TAKE 1 TABLET BY MOUTH TWICE A DAY 03/10/22  Yes Ivonne Andrew, NP  fluticasone-salmeterol (ADVAIR HFA) 115-21 MCG/ACT inhaler Inhale 2 puffs into the lungs 2 (two) times daily. 04/28/21  Yes Martina Sinner, MD  gabapentin (NEURONTIN) 100 MG capsule Take 100 mg by mouth 3 (three) times daily.   Yes [provider]  lidocaine (LIDODERM) 5 % Place 1 patch onto the skin daily as needed (for pain- remove old patch first).  10/28/18  Yes [provider]  metoCLOPramide (REGLAN) 5 MG tablet Take 1 tablet (5 mg total) by mouth 2 (two) times daily before a meal. 01/12/22  Yes Ivonne Andrew, NP  midodrine (PROAMATINE) 10 MG tablet Take 1.5 tablets (15 mg total) by mouth 3 (three) times daily. NEEDS FOLLOW UP APPOINTMENT FOR ANYMORE REFILLS 05/11/22  Yes Milford, Anderson Malta, FNP  omeprazole (PRILOSEC) 40 MG capsule TAKE 1 CAPSULE (40 MG TOTAL) BY MOUTH DAILY. 04/17/22  Yes Nandigam, Eleonore Chiquito, MD  ondansetron (ZOFRAN-ODT) 8 MG disintegrating tablet Take 8 mg by mouth as needed for nausea or vomiting.   Yes [provider]  VELPHORO 500 MG chewable tablet Chew 500 mg by mouth 3 (three) times daily.   Yes [provider]  Vitamin D, Ergocalciferol, (DRISDOL) 1.25 MG (50000 UNIT) CAPS capsule TAKE 1 CAPSULE (50,000 UNITS TOTAL) BY MOUTH EVERY 7 (SEVEN) DAYS 04/09/21  Yes Barbette Merino, NP  zolpidem (AMBIEN) 10 MG tablet Take 1 tablet (10 mg total) by mouth at bedtime as needed for sleep. 08/24/19  Yes Sherryll Burger, Pratik D, DO  allopurinol (  ZYLOPRIM) 100 MG tablet TAKE 1  TABLET BY MOUTH EVERY DAY 08/31/22   Ivonne Andrew, NP  diclofenac Sodium (VOLTAREN) 1 % GEL Apply 4 g topically as needed. 06/22/22   Ivonne Andrew, NP  diphenhydrAMINE (BENADRYL) 25 mg capsule Take 1 capsule (25 mg total) by mouth every 8 (eight) hours as needed. 07/21/19   Kallie Locks, FNP  fluticasone (FLONASE) 50 MCG/ACT nasal spray Place 1 spray into both nostrils daily as needed for allergies or rhinitis.    [provider]  oxyCODONE-acetaminophen (PERCOCET) 10-325 MG tablet Take 1 tablet by mouth 4 (four) times daily as needed for pain. 09/08/19   [provider]    Family History Family History  Problem Relation Age of Onset   Glaucoma Mother    Pancreatic cancer Father    Multiple sclerosis Brother    Diabetes Maternal Uncle    Hypertension Maternal Grandmother    Breast cancer Neg Hx     Social History Social History   Tobacco Use   Smoking status: Every Day    Current packs/day: 1.50    Average packs/day: 1.5 packs/day for 35.0 years (52.5 ttl pk-yrs)    Types: Cigarettes    Passive exposure: Never   Smokeless tobacco: Never   Tobacco comments:    Pack of cigarettes lasts 2 days 09/18/2020  Vaping Use   Vaping status: Never Used  Substance Use Topics   Alcohol use: Not Currently    Comment: rarely   Drug use: Yes    Frequency: 1.0 times per week    Types: Marijuana    Comment: occ     Allergies   Levofloxacin and Tape   Review of Systems Review of Systems  Constitutional:  Negative for chills and fever.  HENT:  Positive for congestion and sore throat. Negative for ear pain and sinus pressure.   Eyes:  Negative for discharge and redness.  Respiratory:  Positive for cough. Negative for shortness of breath and wheezing.   Gastrointestinal:  Negative for diarrhea, nausea and vomiting.  Musculoskeletal:  Positive for myalgias.     Physical Exam Triage Vital Signs ED Triage Vitals  Encounter Vitals Group     BP 09/14/22 1111  (!) 94/58     Systolic BP Percentile --      Diastolic BP Percentile --      Pulse Rate 09/14/22 1111 76     Resp 09/14/22 1111 18     Temp 09/14/22 1111 98.5 F (36.9 C)     Temp Source 09/14/22 1111 Oral     SpO2 09/14/22 1111 99 %     Weight 09/14/22 1107 112 lb (50.8 kg)     Height 09/14/22 1107 4\' 11"  (1.499 m)     Head Circumference --      Peak Flow --      Pain Score 09/14/22 1107 0     Pain Loc --      Pain Education --      Exclude from Growth Chart --    No data found.  Updated Vital Signs BP (!) 94/58 (BP Location: Left Arm) Comment: "known hypotensive" per patient.  Pulse 76   Temp 98.5 F (36.9 C) (Oral)   Resp 18   Ht 4\' 11"  (1.499 m)   Wt 112 lb (50.8 kg)   SpO2 99%   BMI 22.62 kg/m      Physical Exam Vitals and nursing note reviewed.  Constitutional:  General: She is not in acute distress.    Appearance: Normal appearance. She is not ill-appearing.  HENT:     Head: Normocephalic and atraumatic.     Nose: Congestion present.     Mouth/Throat:     Mouth: Mucous membranes are moist.     Pharynx: No oropharyngeal exudate or posterior oropharyngeal erythema.  Eyes:     Conjunctiva/sclera: Conjunctivae normal.  Cardiovascular:     Rate and Rhythm: Normal rate and regular rhythm.     Heart sounds: Normal heart sounds. No murmur heard. Pulmonary:     Effort: Pulmonary effort is normal. No respiratory distress.     Breath sounds: Rhonchi present. No wheezing.     Comments: Scattered rhonchi Skin:    General: Skin is warm and dry.  Neurological:     Mental Status: She is alert.  Psychiatric:        Mood and Affect: Mood normal.        Thought Content: Thought content normal.      UC Treatments / Results  Labs (all labs ordered are listed, but only abnormal results are displayed) Labs Reviewed - No data to display  EKG   Radiology No results found.  Procedures Procedures (including critical care time)  Medications Ordered in  UC Medications - No data to display  Initial Impression / Assessment and Plan / UC Course  I have reviewed the triage vital signs and the nursing notes.  Pertinent labs & imaging results that were available during my care of the patient were reviewed by me and considered in my medical decision making (see chart for details).    Patient reports she is improving somewhat, discussed concerns with past medical history and comorbidities unable to prescribe Paxlovid.  Recommended further evaluation in the emergency room with any worsening symptoms as she may be a good candidate for admission for observation.  Patient expresses understanding.  Final Clinical Impressions(s) / UC Diagnoses   Final diagnoses:  COVID-19   Discharge Instructions   None    ED Prescriptions   None    PDMP not reviewed this encounter.   Tomi Bamberger, PA-C 09/14/22 1148

## 2022-09-14 NOTE — ED Triage Notes (Signed)
"  Tested + for COVID19 on Thursday, Still having cough, congestion, headache, chills, pains in various areas". No fever.

## 2022-09-18 ENCOUNTER — Ambulatory Visit: Payer: Self-pay | Admitting: Nurse Practitioner

## 2022-09-23 ENCOUNTER — Ambulatory Visit: Payer: Medicare Other

## 2022-09-27 ENCOUNTER — Other Ambulatory Visit: Payer: Self-pay | Admitting: Nurse Practitioner

## 2022-09-28 ENCOUNTER — Ambulatory Visit
Admission: RE | Admit: 2022-09-28 | Discharge: 2022-09-28 | Disposition: A | Discharge status: Home / Self Care | Payer: Medicare Other | Source: Ambulatory Visit

## 2022-09-28 DIAGNOSIS — Z87891 Personal history of nicotine dependence: Secondary | ICD-10-CM

## 2022-09-28 DIAGNOSIS — Z122 Encounter for screening for malignant neoplasm of respiratory organs: Secondary | ICD-10-CM

## 2022-09-28 DIAGNOSIS — F17210 Nicotine dependence, cigarettes, uncomplicated: Secondary | ICD-10-CM

## 2022-09-29 NOTE — Therapy (Signed)
OUTPATIENT PHYSICAL THERAPY TREATMENT NOTE    Patient Name: Tina Watkins MRN: [id number] DOB:30-Jul-1965, 57 y.o., female Today's Date: 09/30/2022   END OF SESSION:  PT End of Session - 09/30/22 1044     Visit Number 7    Number of Visits 9    Date for PT Re-Evaluation 11/04/22    Authorization Type MCR / MCD    Progress Note Due on Visit 10    PT Start Time 1045    PT Stop Time 1133    PT Time Calculation (min) 48 min    Activity Tolerance Patient tolerated treatment well    Behavior During Therapy WFL for tasks assessed/performed              Past Medical History:  Diagnosis Date   Bacteremia due to Gram-negative bacteria 05/23/2011   Blind    right eye   Blind right eye    CHF (congestive heart failure) (HCC)    Chronic lower back pain    Complication of anesthesia    "woke up during OR; I have an extremely high tolerance" (12/11/2011) 1 procedure was graft; the other procedures were procedures that are typically done with sedation.   DDD (degenerative disc disease), cervical    Depression    Dysrhythmia    "tachycardia" (12/11/2011) new onset afib 10/15/14 EKG   E coli bacteremia 06/18/2011   Elevated LDL cholesterol level 12/2018   ESRD (end stage renal disease) (HCC) 06/12/2011   Fibromyalgia    Gastroparesis    Gastroparesis    GERD (gastroesophageal reflux disease)    Glaucoma    right eye   Gout    H/O carpal tunnel syndrome    Headache 10/2019   Headache(784.0)    "not often anymore" (12/11/2011)   Herpes genitalia 1994   History of blood transfusion    "more than a few times" (12/11/2011)   History of stomach ulcers    Hypotension    Iron deficiency anemia    New onset a-fib (HCC)    10/15/14 EKG   Osteopenia    Peripheral neuropathy 11/2018   Pressure ulcer of sacral region, stage 1 07/2019   Seizures (HCC) 1994   "post transplant; only have had that one" (12/11/2011)   Spinal stenosis in cervical region    Stroke [facility])     left basal  ganglia lacunar infarct; Right frontal lobe lacunar infarct.   Stroke [facility]) ~ 1999; 2001   "briefly lost my vision; lost my right eye" (12/11/2011)   Vitamin D deficiency 12/2018   Past Surgical History:  Procedure Laterality Date   ANTERIOR CERVICAL DECOMP/DISCECTOMY FUSION N/A 01/08/2015   Procedure: Anterior Cervical Three-Four/Four-Five Decompression/Diskectomy/Fusion;  Surgeon: Hilda Lias, MD;  Location: [facility] NEURO ORS;  Service: Neurosurgery;  Laterality: N/A;  C3-4 C4-5 Anterior cervical decompression/diskectomy/fusion   APPENDECTOMY  ~ 2004   BIOPSY  09/12/2020   Procedure: BIOPSY;  Surgeon: Rachael Fee, MD;  Location: [facility] ENDOSCOPY;  Service: Endoscopy;;   CATARACT EXTRACTION     right eye   COLONOSCOPY     COLONOSCOPY WITH PROPOFOL N/A 09/12/2020   Procedure: COLONOSCOPY WITH PROPOFOL;  Surgeon: Rachael Fee, MD;  Location: [facility] ENDOSCOPY;  Service: Endoscopy;  Laterality: N/A;   ENUCLEATION  2001   "right"   ESOPHAGOGASTRODUODENOSCOPY (EGD) WITH PROPOFOL N/A 04/21/2012   Procedure: ESOPHAGOGASTRODUODENOSCOPY (EGD) WITH PROPOFOL;  Surgeon: Rachael Fee, MD;  Location: [facility] ENDOSCOPY;  Service: Endoscopy;  Laterality: N/A;   ESOPHAGOGASTRODUODENOSCOPY (EGD) WITH  PROPOFOL N/A 09/12/2020   Procedure: ESOPHAGOGASTRODUODENOSCOPY (EGD) WITH PROPOFOL;  Surgeon: Rachael Fee, MD;  Location: [facility] ENDOS[facility];  Service: Endoscopy;  Laterality: N/A;   INSERTION OF DIALYSIS CATHETER  1988   "AV graft LUA & LFA; LUA worked for 1 day; LFA never worked"   IR FLUORO GUIDE CV LINE RIGHT  07/11/2019   IR FLUORO GUIDE CV LINE RIGHT  10/20/2019   IR FLUORO GUIDE CV LINE RIGHT  05/31/2020   IR PTA VENOUS EXCEPT DIALYSIS CIRCUIT  05/31/2020   IR RADIOLOGY PERIPHERAL GUIDED IV START  07/11/2019   IR US GUIDE VASC ACCESS RIGHT  07/11/2019   IR US GUIDE VASC ACCESS RIGHT  07/11/2019   IR US GUIDE VASC ACCESS RIGHT  07/11/2019   IR VENOCAVAGRAM IVC  05/31/2020   KIDNEY TRANSPLANT  1994; 1999; 2005   "right"    MULTIPLE TOOTH EXTRACTIONS     POLYPECTOMY  09/12/2020   Procedure: POLYPECTOMY;  Surgeon: Rachael Fee, MD;  Location: [facility] ENDOSCOPY;  Service: Endoscopy;;   RIGHT HEART CATH N/A 03/23/2019   Procedure: RIGHT HEART CATH;  Surgeon: Dolores Patty, MD;  Location: [facility] INVASIVE CV LAB;  Service: Cardiovascular;  Laterality: N/A;   TONSILLECTOMY     TOTAL NEPHRECTOMY  1988?; 1994; 2005   Patient Active Problem List   Diagnosis Date Noted   Encounter for monitoring amiodarone therapy 07/01/2022   Atrial fibrillation (HCC) 07/01/2022   Bilateral hip pain 01/14/2022   Prolonged Q-T interval on ECG 09/07/2021   Nausea & vomiting 09/06/2021   Bilateral shoulder pain 06/06/2021   Bilateral hand swelling 06/06/2021   Cervical radiculitis 01/24/2021   Acute respiratory failure with hypoxia (HCC) 10/23/2020   Coagulation defect, unspecified (HCC) 08/01/2020   Aortic atherosclerosis (HCC) 07/10/2020   Hypercoagulable state due to atrial fibrillation (HCC) 04/23/2020   Atrial flutter (HCC) 03/29/2020   Chronic pain of both knees 03/13/2020   Chills (without fever) 12/12/2019   Other pancytopenia (HCC)    Decubitus ulcer of sacral region, stage 1 08/18/2019   HCAP (healthcare-associated pneumonia) 08/05/2019   Pressure injury of skin 07/24/2019   Chest congestion 07/24/2019   Itching 07/24/2019   Weakness 06/13/2019   Pneumonia due to COVID-19 virus 05/09/2019   Hypotension 05/09/2019   Symptomatic anemia 05/09/2019   CHF (congestive heart failure) (HCC) 05/09/2019   Swollen abdomen 01/04/2019   DDD (degenerative disc disease), cervical 12/06/2018   Neuropathy 12/06/2018   Paresthesia 10/11/2018   Neck pain 10/11/2018   Tobacco abuse 11/20/2017   Right leg swelling 11/20/2017   Right leg pain 11/20/2017   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   GERD (gastroesophageal reflux disease) 11/20/2017   Acute venous embolism and thrombosis of deep vessels of proximal lower extremity, right  (HCC) 11/20/2017   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    Thrush, oral    AKI (acute kidney injury) (HCC)    Multifocal pneumonia 08/09/2017   ETD (Eustachian tube dysfunction), bilateral 08/03/2017   Acute recurrent pansinusitis 07/21/2017   Chest pain 09/29/2015   Cervical stenosis of spinal canal 01/08/2015   Urinary tract infectious disease    Muscle spasms of neck 06/15/2014   Bleeding hemorrhoid 06/15/2014   Anemia in chronic kidney disease 06/15/2014   Pyelonephritis, acute 06/13/2014   Sepsis (HCC) 06/13/2014   History of kidney transplant    Abnormal CT scan 09/14/2012   Chronic pain syndrome 04/09/2012   Dehydration, mild 04/09/2012  Herpes infection 06/23/2011   Anxiety 06/23/2011   E coli bacteremia 06/18/2011   ESRD (end stage renal disease) (HCC) 06/12/2011   UTI (lower urinary tract infection) 06/12/2011   Bacteremia due to Gram-negative bacteria 05/23/2011   History of renal transplantation 05/22/2011   Septic shock(785.52) 05/22/2011   Acute on chronic kidney failure (HCC) 05/22/2011   Gastroparesis 04/24/2008   WEIGHT LOSS 08/24/2007   NAUSEA AND VOMITING 08/24/2007    PCP: Ivonne Andrew, NP  REFERRING PROVIDER: Unknown Jim, MD  REFERRING DIAG: Bil adhesive capsulitis   THERAPY DIAG:  Chronic pain of both shoulders  Muscle weakness (generalized)  Chronic left shoulder pain  Chronic right shoulder pain  Rationale for Evaluation and Treatment: Rehabilitation  ONSET DATE: Chronic, ongoing for years   SUBJECTIVE:                                                                                                                                                                                     SUBJECTIVE STATEMENT: Patient reports that she recently had COVID and has been recovering from that. She states her shoulders are still sore and the pain is "off and on."   PAIN:  Are you having pain?  Yes:  NPRS scale: 7/10 Pain location: Bilateral shoulders Pain description: constant aching Aggravating factors: Cooking, washing dishes, grooming, reaching into cabinet Relieving factors: Rest  PERTINENT HISTORY: History of stroke, DVT, CHF, Afib, Neuropathy, neck pain with ACDF C3-5 in 2016, gout, DDD, ESRD, cervical stenosis, dialysis, kidney transplant (failed), see complex medical history above  PRECAUTIONS: See pertinent medical history  WEIGHT BEARING RESTRICTIONS: No  PATIENT GOALS: Improve shoulder pain and ability to reach overhead, put hair in ponytail   OBJECTIVE:  PATIENT SURVEYS:  FOTO 56 % functional status  POSTURE: Rounded shoulder and forward head posture  UPPER EXTREMITY ROM:   ROM Right eval Left eval Rt / Lt 08/12/2022 Rt / Lt 08/17/2022 R/L 8/7  Shoulder flexion 90 100 105 / 100 95 / 100 90/115  Shoulder extension       Shoulder abduction 65 90     Shoulder adduction       Shoulder internal rotation Reach to lateral iliac crest Reach to PSIS     Shoulder external rotation Unable to reach to head Reach to occiput     Elbow flexion       Elbow extension       Wrist flexion       Wrist extension       Wrist ulnar deviation       Wrist radial deviation       Wrist pronation  Wrist supination       (Blank rows = not tested)  UPPER EXTREMITY MMT:    Strength assessment within available range  MMT Right eval Left eval  Shoulder flexion 3 3  Shoulder extension    Shoulder abduction 3 3  Shoulder adduction    Shoulder internal rotation    Shoulder external rotation    Middle trapezius    Lower trapezius    Elbow flexion    Elbow extension    Wrist flexion    Wrist extension    Wrist ulnar deviation    Wrist radial deviation    Wrist pronation    Wrist supination    Grip strength (lbs)    (Blank rows = not tested)  JOINT MOBILITY TESTING:  Not assessed  PALPATION:  Generalized tenderness of bilateral shoulders    TODAY'S  TREATMENT:   [facility] Adult PT Treatment:                                                DATE: 09/30/22 Therapeutic Exercise: Nustep L2 x 5 min with UE/LE while taking subjective, working on shoulder motion Seated overhead pulley for shoulder flexion x 3 min (painful on Rt) Standing shoulder flexion stretch with physioball 10 x 5 sec BIL Standing row RTB 2 x 10 Stand shoulder extension RTB x10, x7 (pain in Lt shoulder) Reclined table with pillows: Chest press with dowel to overhead reach x 10 Double ER with yellow x 10  Shoulder flexion dowel x 10 partial range due to pain Modalities: MHP applied to shoulders x 10 min post session   [facility] Adult PT Treatment:                                                DATE: 8/7 Therapeutic Exercise: Nustep L5 x 5 min with UE/LE while taking subjective, working on shoulder motion Seated overhead pulley for shoulder elevation x 4 min Seated shoulder flexion stretch with physioball 10 x 5 sec Standing row with red 2 x 10 Standing finger ladder x 5 each Reclined on wedge with pillows: Chest press with dowel to overhead reach 2 x 10 Double ER with yellow 2 x 10 more of an isometric Shoulder flexion dowel x 5 partial range due to pain Therapeutic Activity - collecting information for goals, checking progress, and reviewing with patient Modalities: MHP applied to shoulders x 10 min post session  [facility] Adult PT Treatment:                                                DATE: 08/26/22 Therapeutic Exercise: Nustep L5 x 5 min with UE/LE while taking subjective, working on shoulder motion Seated overhead pulley for shoulder elevation x 4 min Seated shoulder flexion stretch with physioball 10 x 5 sec Standing row with red 2 x 10 Standing finger ladder x 5 each Reclined on wedge with pillows: Chest press with dowel to overhead reach 2 x 10 Double ER with yellow 2 x 10 more of an isometric Shoulder flexion dowel x 5 partial range due to pain Modalities: MHP  applied to shoulders x 10 min post session      PATIENT EDUCATION: Education details: HEP Person educated: Patient Education method: Programmer, multimedia, Demonstration, Tactile cues, Verbal cues Education comprehension: verbalized understanding, returned demonstration, verbal cues required, tactile cues required, and needs further education  HOME EXERCISE PROGRAM: Access Code: [email or web address]    ASSESSMENT: CLINICAL IMPRESSION: Patient presents to PT reporting continued BIL shoulder pain and that she has been recovering from a recent bout of COVID. Session today focused on shoulder ROM and gentle strengthening within her pain tolerance. Patient continues to benefit from skilled PT services and should be progressed as able to improve functional independence.   OBJECTIVE IMPAIRMENTS: decreased activity tolerance, decreased ROM, decreased strength, postural dysfunction, and pain.   ACTIVITY LIMITATIONS: carrying, lifting, bathing, dressing, reach over head, and hygiene/grooming  PARTICIPATION LIMITATIONS: meal prep, cleaning, laundry, shopping, and community activity  PERSONAL FACTORS: Fitness, Past/current experiences, Social background, Time since onset of injury/illness/exacerbation, and 3+ comorbidities: see pertinent medical history above  are also affecting patient's functional outcome.    GOALS: Goals reviewed with patient? Yes  SHORT TERM GOALS: Target date: 08/03/2022  Patient will be I with initial HEP in order to progress with therapy. Baseline: HEP provided at eval 08/12/2022: progressing Goal status: ONGOING  2.  Patient will report bilateral shoulder pain </= 3/10 in order to reduce functional limitations Baseline: patient reports shoulder pain 6/10 08/12/2022: patient reports shoulder pain 3/10 Goal status: MET  LONG TERM GOALS: Target date: 08/31/2022 extended to 10/07/2022   Patient will be I with final HEP to maintain progress from PT. Baseline: HEP provided at eval 8/7:  independent Goal status: ONGOING  2.  Patient will report >/= 63% status on FOTO to indicate improved functional ability. Baseline: 56% functional status 8/7: 53 Goal status: IN PROGRESS  3.  Patient will demonstrate bilateral shoulder AROM flexion >/= 110 deg in order to improve reach into low shelf Baseline: shoulder AROM flexion 90 deg 8/7: R: 90, L 115 Goal status: IN PROGRESS  4.  Patient will be able to reach to occiput with right arm to demonstrate improved functional ER and ability to put her hair in a ponytail Baseline: patient unable to perform due to pain and limited motion 8/7: Top of head with significant pain Goal status: ONGOING   PLAN: PT FREQUENCY: 1x/week  PT DURATION: 8 weeks  PLANNED INTERVENTIONS: Therapeutic exercises, Therapeutic activity, Neuromuscular re-education, Balance training, Gait training, Patient/Family education, Self Care, Joint mobilization, Aquatic Therapy, Dry Needling, Cryotherapy, Moist heat, Manual therapy, and Re-evaluation  PLAN FOR NEXT SESSION: Review HEP and progress PRN, manual/stretching for bilateral shoulders as tolerated, progressive strengthening and ROM exercises   Berta Minor PTA 09/30/22  11:25 AM Phone: [phone] Fax: [phone]

## 2022-09-30 ENCOUNTER — Ambulatory Visit: Payer: Medicare Other

## 2022-09-30 DIAGNOSIS — M6281 Muscle weakness (generalized): Secondary | ICD-10-CM

## 2022-09-30 DIAGNOSIS — G8929 Other chronic pain: Secondary | ICD-10-CM

## 2022-09-30 DIAGNOSIS — M25511 Pain in right shoulder: Secondary | ICD-10-CM | POA: Diagnosis not present

## 2022-10-02 ENCOUNTER — Telehealth: Payer: Self-pay | Admitting: Gastroenterology

## 2022-10-02 ENCOUNTER — Telehealth: Payer: Medicare Other | Admitting: Nurse Practitioner

## 2022-10-02 ENCOUNTER — Encounter: Payer: Self-pay | Admitting: Nurse Practitioner

## 2022-10-02 VITALS — Ht 59.0 in | Wt 110.0 lb

## 2022-10-02 DIAGNOSIS — U071 COVID-19: Secondary | ICD-10-CM | POA: Insufficient documentation

## 2022-10-02 NOTE — Assessment & Plan Note (Signed)
Stay well hydrated  Stay active  Deep breathing exercises   May take tylenol or fever or pain  Bland diet for GI upset  Follow up:  Follow up in 3 months

## 2022-10-02 NOTE — Patient Instructions (Signed)
1. COVID-19   Stay well hydrated  Stay active  Deep breathing exercises   May take tylenol or fever or pain  Bland diet for GI upset  Follow up:  Follow up in 3 months

## 2022-10-02 NOTE — Telephone Encounter (Signed)
Left message on machine to call back  

## 2022-10-02 NOTE — Progress Notes (Signed)
Virtual Visit via Video Note  I connected with Tina Watkins on 10/02/22 at  2:20 PM EDT by a video enabled telemedicine application and verified that I am speaking with the correct person using two identifiers.  Location: Patient: home Provider: office   I discussed the limitations of evaluation and management by telemedicine and the availability of in person appointments. The patient expressed understanding and agreed to proceed.  History of Present Illness:  Tina Watkins 57 y.o. female  has a past medical history of Bacteremia due to Gram-negative bacteria (05/23/2011), Blind, Blind right eye, CHF (congestive heart failure) (HCC), Chronic lower back pain, Complication of anesthesia, DDD (degenerative disc disease), cervical, Depression, Dysrhythmia, E coli bacteremia (06/18/2011), Elevated LDL cholesterol level (12/2018), ESRD (end stage renal disease) (HCC) (06/12/2011), Fibromyalgia, Gastroparesis, Gastroparesis, GERD (gastroesophageal reflux disease), Glaucoma, Gout, H/O carpal tunnel syndrome, Headache (10/2019), Headache(784.0), Herpes genitalia (1994), History of blood transfusion, History of stomach ulcers, Hypotension, Iron deficiency anemia, New onset a-fib (HCC), Osteopenia, Peripheral neuropathy (11/2018), Pressure ulcer of sacral region, stage 1 (07/2019), Seizures (HCC) (1994), Spinal stenosis in cervical region, Stroke [facility]), Stroke (HCC) (~ 1999; 2001), and Vitamin D deficiency (12/2018).   Patient presents through video visit for ED follow-up.  Patient was recently seen in the ED on 09/14/2022 and diagnosed with COVID.  She did not receive any treatment due to renal status.  Patient is improving but states that she has been having GI upset related to COVID.  She does already have Reglan and omeprazole and Zofran ordered.  We discussed that she does need to make sure that she is eating a bland diet and staying as well-hydrated as possible.  If symptoms worsen over the weekend  she can go to either urgent care or the emergency room. Denies f/c/s, n/v/d, hemoptysis, PND, leg swelling Denies chest pain or edema      Observations/Objective:     10/02/2022    2:12 PM 09/14/2022   11:11 AM 09/14/2022   11:07 AM  Vitals with BMI  Height 4\' 11"   4\' 11"   Weight 110 lbs  112 lbs  BMI 22.21  22.61  Systolic  94   Diastolic  58   Pulse  76       Assessment and Plan:  1. COVID-19   Stay well hydrated  Stay active  Deep breathing exercises   May take tylenol or fever or pain  Bland diet for GI upset  Follow up:  Follow up in 3 months     I discussed the assessment and treatment plan with the patient. The patient was provided an opportunity to ask questions and all were answered. The patient agreed with the plan and demonstrated an understanding of the instructions.   The patient was advised to call back or seek an in-person evaluation if the symptoms worsen or if the condition fails to improve as anticipated.  I provided 23 minutes of non-face-to-face time during this encounter.   Ivonne Andrew, NP

## 2022-10-02 NOTE — Telephone Encounter (Signed)
Inbound call from patient stating that she is scheduled for an appointment with Dr. Russella Dar in October. Patient was calling stated that she has been having  nausea, gas, fowl  bowel movements and feels like something moving in her stomach. Patient stated that she feels like she might have a infection or something to that nature. Patient is requesting a call back to discuss. Please advise.

## 2022-10-05 IMAGING — CR DG CHEST 1V
1 series · 1 of 1 positions shown · non-contrast
Comparison: March 28, 2020

CLINICAL DATA: Chest pain and cough

EXAM:
CHEST  1 VIEW

[chest pa]
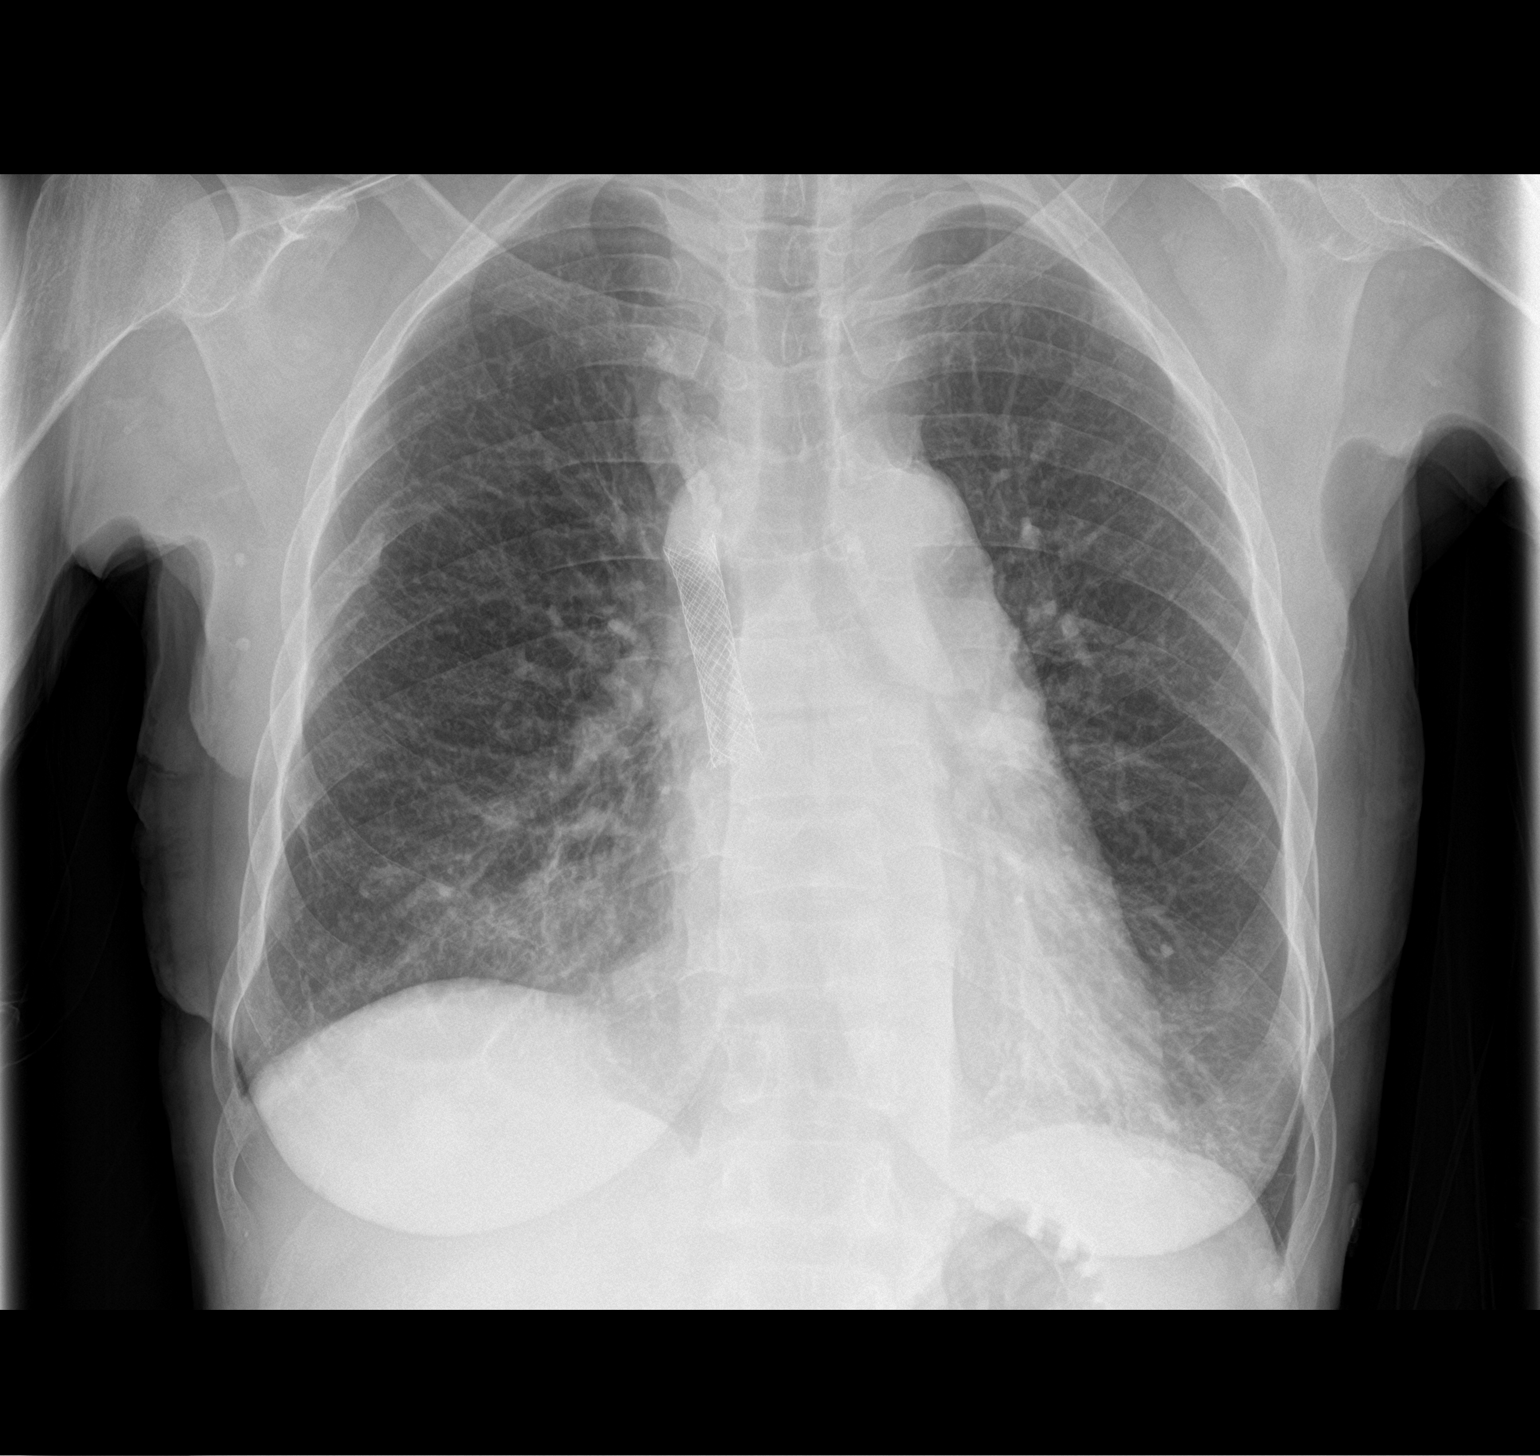

[1 of 1 positions shown; findings below may reference images not displayed]

FINDINGS: There is ill-defined airspace opacity in the medial right base.
There is interstitial thickening in the lungs bilaterally. Heart
size and pulmonary vascularity are normal. No adenopathy. Superior
vena cava stent present. No appreciable adenopathy. Old healed
fracture right posterior sixth rib. Postoperative change lower
cervical spine. Surgical clips in upper abdomen noted.
IMPRESSION: Ill-defined airspace opacity consistent with pneumonia medial right
base. Suspect a degree of underlying chronic bronchitis. Stable
cardiac silhouette. No adenopathy appreciable.

## 2022-10-06 ENCOUNTER — Telehealth: Payer: Self-pay | Admitting: Acute Care

## 2022-10-06 NOTE — Telephone Encounter (Signed)
Call Report  

## 2022-10-06 NOTE — Telephone Encounter (Signed)
Spoke with the pt and she states she has an appt with Dr Russella Dar and will keep that as planned for now. Nothing further needed.

## 2022-10-06 NOTE — Telephone Encounter (Signed)
CALL REPORT:  IMPRESSION: 1. Lung-RADS 4B, suspicious. Additional imaging evaluation or consultation with Pulmonology or Thoracic Surgery recommended. Dominant new solid peripheral left upper lobe pulmonary nodule measuring 21.4 mm in volume derived mean diameter. Several new clustered solid pulmonary nodules throughout the left upper lobe, largest 5 mm. Numerous additional tiny 2-3 mm solid pulmonary nodules scattered in both lungs are new. Findings are equivocal for primary bronchogenic malignancy in the left upper lobe with metastases versus multilobar infection. Consider PET-CT at this time versus tissue sampling versus short-term follow-up chest CT, as clinically warranted. 2. Two cystic pancreatic tail lesions, largest 2.6 cm posteriorly, not definitely seen on prior CT, indeterminate. MRI abdomen without and with IV contrast recommended for further characterization when clinically feasible. 3. Dilated main pulmonary artery, suggesting pulmonary arterial hypertension. 4. Three-vessel coronary atherosclerosis. 5. Mildly irregular liver surface, cannot exclude cirrhosis. 6. Aortic Atherosclerosis (ICD10-I70.0) and Emphysema (ICD10-J43.9).  Confirmed receipt with [facility] Forwarded to [employer]

## 2022-10-07 ENCOUNTER — Telehealth: Payer: Self-pay | Admitting: Acute Care

## 2022-10-08 ENCOUNTER — Telehealth: Payer: Self-pay | Admitting: Acute Care

## 2022-10-08 NOTE — Telephone Encounter (Signed)
Patient returned call for LDCT results. A message was taken. I returned the call and there was no answer. I left a HIPAA compliant message with our contact information asking her to call the office for her results. We will await her return call.

## 2022-10-08 NOTE — Telephone Encounter (Signed)
Patient is returning call.  °

## 2022-10-08 NOTE — Telephone Encounter (Signed)
I have attempted to call the patient with the results of their  Low Dose CT Chest Lung cancer screening scan. There was no answer. I have left a HIPPA compliant VM requesting the patient call the office for the scan results. I included the office contact information in the message. We will await her return call. If no return call we will continue to call until patient is contacted.   This patient has a 4 B scan. She needs a PET scan. I will order this once we have reached the patient to go over his screening results.  Thanks so much

## 2022-10-08 NOTE — Telephone Encounter (Signed)
Patient returned call and left VM regarding CT results. Will forward to Kandice Robinsons NP.

## 2022-10-09 ENCOUNTER — Telehealth: Payer: Self-pay | Admitting: Acute Care

## 2022-10-09 DIAGNOSIS — R918 Other nonspecific abnormal finding of lung field: Secondary | ICD-10-CM

## 2022-10-09 NOTE — Telephone Encounter (Signed)
PET scan has been ordered and results with plan have been sent to PCP.

## 2022-10-09 NOTE — Telephone Encounter (Signed)
I have called the patient with the results of her low-dose screening CT. Low-dose screening CT done 09/28/2022 was read as a lung RADS 4B, suspicious.  There is a dominant new solid peripheral upper lobe nodule that measures 21.4 mm. Additionally, there are several new clustered solid pulmonary nodules throughout the left upper lobe the largest of which is 5 mm.  There are also numerous additional tiny 2 to 3 mm solid pulmonary nodules scattered throughout both lungs that are new. Findings are concerning for primary bronchogenic malignancy in the left upper lobe with metastasis versus a multi lobar infection. There was also notation of 2 cystic pancreatic tail lesions the largest of which is 2.6 cm not seen on previous CT imaging. We will evaluate these closely on pet imaging. Patient is in agreement with PET scan to further evaluate the finding of the new solid peripheral upper lobe lung nodule. She understands that I will place the order now and she will get a call to get it scheduled.  She will then need follow-up with either Pati Thinnes NP, Dr. Tonia Brooms or Dr. Delton Coombes in the [place] office to review the results of the scan. Patient is in agreement with the above plan. I will place the order for the PET scan. Sherre Lain and [place] please fax results to PCP and let her know plan is for a PET scan to further evaluate the finding of the dominant new solid peripheral upper lobe pulmonary nodule. Thank you so much

## 2022-10-14 ENCOUNTER — Other Ambulatory Visit: Payer: Self-pay

## 2022-10-14 ENCOUNTER — Encounter: Payer: Self-pay | Admitting: Physical Therapy

## 2022-10-14 ENCOUNTER — Ambulatory Visit: Payer: Medicare Other | Attending: Family Medicine | Admitting: Physical Therapy

## 2022-10-14 DIAGNOSIS — M25511 Pain in right shoulder: Secondary | ICD-10-CM | POA: Insufficient documentation

## 2022-10-14 DIAGNOSIS — G8929 Other chronic pain: Secondary | ICD-10-CM | POA: Insufficient documentation

## 2022-10-14 DIAGNOSIS — M25512 Pain in left shoulder: Secondary | ICD-10-CM | POA: Diagnosis present

## 2022-10-14 DIAGNOSIS — M6281 Muscle weakness (generalized): Secondary | ICD-10-CM | POA: Insufficient documentation

## 2022-10-14 NOTE — Therapy (Signed)
OUTPATIENT PHYSICAL THERAPY TREATMENT NOTE    Patient Name: Tina Watkins MRN: [id number] DOB:Jan 03, 1966, 57 y.o., female Today's Date: 10/14/2022   END OF SESSION:  PT End of Session - 10/14/22 1035     Visit Number 8    Number of Visits 16    Date for PT Re-Evaluation 12/09/22    Authorization Type MCR / MCD    Progress Note Due on Visit 10    PT Start Time 1019    PT Stop Time 1050   patient request to leave early   PT Time Calculation (min) 31 min    Activity Tolerance Patient tolerated treatment well    Behavior During Therapy [facility] for tasks assessed/performed               Past Medical History:  Diagnosis Date   Bacteremia due to Gram-negative bacteria 05/23/2011   Blind    right eye   Blind right eye    CHF (congestive heart failure) (HCC)    Chronic lower back pain    Complication of anesthesia    "woke up during OR; I have an extremely high tolerance" (12/11/2011) 1 procedure was graft; the other procedures were procedures that are typically done with sedation.   DDD (degenerative disc disease), cervical    Depression    Dysrhythmia    "tachycardia" (12/11/2011) new onset afib 10/15/14 EKG   E coli bacteremia 06/18/2011   Elevated LDL cholesterol level 12/2018   ESRD (end stage renal disease) (HCC) 06/12/2011   Fibromyalgia    Gastroparesis    Gastroparesis    GERD (gastroesophageal reflux disease)    Glaucoma    right eye   Gout    H/O carpal tunnel syndrome    Headache 10/2019   Headache(784.0)    "not often anymore" (12/11/2011)   Herpes genitalia 1994   History of blood transfusion    "more than a few times" (12/11/2011)   History of stomach ulcers    Hypotension    Iron deficiency anemia    New onset a-fib (HCC)    10/15/14 EKG   Osteopenia    Peripheral neuropathy 11/2018   Pressure ulcer of sacral region, stage 1 07/2019   Seizures (HCC) 1994   "post transplant; only have had that one" (12/11/2011)   Spinal stenosis in cervical region     Stroke [facility])     left basal ganglia lacunar infarct; Right frontal lobe lacunar infarct.   Stroke [facility]) ~ 1999; 2001   "briefly lost my vision; lost my right eye" (12/11/2011)   Vitamin D deficiency 12/2018   Past Surgical History:  Procedure Laterality Date   ANTERIOR CERVICAL DECOMP/DISCECTOMY FUSION N/A 01/08/2015   Procedure: Anterior Cervical Three-Four/Four-Five Decompression/Diskectomy/Fusion;  Surgeon: Hilda Lias, MD;  Location: [facility] NEURO ORS;  Service: Neurosurgery;  Laterality: N/A;  C3-4 C4-5 Anterior cervical decompression/diskectomy/fusion   APPENDECTOMY  ~ 2004   BIOPSY  09/12/2020   Procedure: BIOPSY;  Surgeon: Rachael Fee, MD;  Location: [facility] ENDOSCOPY;  Service: Endoscopy;;   CATARACT EXTRACTION     right eye   COLONOSCOPY     COLONOSCOPY WITH PROPOFOL N/A 09/12/2020   Procedure: COLONOSCOPY WITH PROPOFOL;  Surgeon: Rachael Fee, MD;  Location: [facility] ENDOSCOPY;  Service: Endoscopy;  Laterality: N/A;   ENUCLEATION  2001   "right"   ESOPHAGOGASTRODUODENOSCOPY (EGD) WITH PROPOFOL N/A 04/21/2012   Procedure: ESOPHAGOGASTRODUODENOSCOPY (EGD) WITH PROPOFOL;  Surgeon: Rachael Fee, MD;  Location: [facility] ENDOSCOPY;  Service: Endoscopy;  Laterality: N/A;   ESOPHAGOGASTRODUODENOSCOPY (EGD) WITH PROPOFOL N/A 09/12/2020   Procedure: ESOPHAGOGASTRODUODENOSCOPY (EGD) WITH PROPOFOL;  Surgeon: Rachael Fee, MD;  Location: [facility] ENDOSCOPY;  Service: Endoscopy;  Laterality: N/A;   INSERTION OF DIALYSIS CATHETER  1988   "AV graft LUA & LFA; LUA worked for 1 day; LFA never worked"   IR FLUORO GUIDE CV LINE RIGHT  07/11/2019   IR FLUORO GUIDE CV LINE RIGHT  10/20/2019   IR FLUORO GUIDE CV LINE RIGHT  05/31/2020   IR PTA VENOUS EXCEPT DIALYSIS CIRCUIT  05/31/2020   IR RADIOLOGY PERIPHERAL GUIDED IV START  07/11/2019   IR US GUIDE VASC ACCESS RIGHT  07/11/2019   IR US GUIDE VASC ACCESS RIGHT  07/11/2019   IR US GUIDE VASC ACCESS RIGHT  07/11/2019   IR VENOCAVAGRAM IVC  05/31/2020   KIDNEY  TRANSPLANT  1994; 1999; 2005   "right"   MULTIPLE TOOTH EXTRACTIONS     POLYPECTOMY  09/12/2020   Procedure: POLYPECTOMY;  Surgeon: Rachael Fee, MD;  Location: [facility] ENDOSCOPY;  Service: Endoscopy;;   RIGHT HEART CATH N/A 03/23/2019   Procedure: RIGHT HEART CATH;  Surgeon: Dolores Patty, MD;  Location: [facility] INVASIVE CV LAB;  Service: Cardiovascular;  Laterality: N/A;   TONSILLECTOMY     TOTAL NEPHRECTOMY  1988?; 1994; 2005   Patient Active Problem List   Diagnosis Date Noted   COVID-19 10/02/2022   Encounter for monitoring amiodarone therapy 07/01/2022   Atrial fibrillation (HCC) 07/01/2022   Bilateral hip pain 01/14/2022   Prolonged Q-T interval on ECG 09/07/2021   Nausea & vomiting 09/06/2021   Bilateral shoulder pain 06/06/2021   Bilateral hand swelling 06/06/2021   Cervical radiculitis 01/24/2021   Acute respiratory failure with hypoxia (HCC) 10/23/2020   Coagulation defect, unspecified (HCC) 08/01/2020   Aortic atherosclerosis (HCC) 07/10/2020   Hypercoagulable state due to atrial fibrillation (HCC) 04/23/2020   Atrial flutter (HCC) 03/29/2020   Chronic pain of both knees 03/13/2020   Chills (without fever) 12/12/2019   Other pancytopenia (HCC)    Decubitus ulcer of sacral region, stage 1 08/18/2019   HCAP (healthcare-associated pneumonia) 08/05/2019   Pressure injury of skin 07/24/2019   Chest congestion 07/24/2019   Itching 07/24/2019   Weakness 06/13/2019   Pneumonia due to COVID-19 virus 05/09/2019   Hypotension 05/09/2019   Symptomatic anemia 05/09/2019   CHF (congestive heart failure) (HCC) 05/09/2019   Swollen abdomen 01/04/2019   DDD (degenerative disc disease), cervical 12/06/2018   Neuropathy 12/06/2018   Paresthesia 10/11/2018   Neck pain 10/11/2018   Tobacco abuse 11/20/2017   Right leg swelling 11/20/2017   Right leg pain 11/20/2017   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   GERD (gastroesophageal reflux disease) 11/20/2017   Acute venous embolism  and thrombosis of deep vessels of proximal lower extremity, right (HCC) 11/20/2017   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    Thrush, oral    AKI (acute kidney injury) (HCC)    Multifocal pneumonia 08/09/2017   ETD (Eustachian tube dysfunction), bilateral 08/03/2017   Acute recurrent pansinusitis 07/21/2017   Chest pain 09/29/2015   Cervical stenosis of spinal canal 01/08/2015   Urinary tract infectious disease    Muscle spasms of neck 06/15/2014   Bleeding hemorrhoid 06/15/2014   Anemia in chronic kidney disease 06/15/2014   Pyelonephritis, acute 06/13/2014   Sepsis (HCC) 06/13/2014   History of kidney transplant    Abnormal CT scan 09/14/2012  Chronic pain syndrome 04/09/2012   Dehydration, mild 04/09/2012   Herpes infection 06/23/2011   Anxiety 06/23/2011   E coli bacteremia 06/18/2011   ESRD (end stage renal disease) (HCC) 06/12/2011   UTI (lower urinary tract infection) 06/12/2011   Bacteremia due to Gram-negative bacteria 05/23/2011   History of renal transplantation 05/22/2011   Septic shock(785.52) 05/22/2011   Acute on chronic kidney failure (HCC) 05/22/2011   Gastroparesis 04/24/2008   WEIGHT LOSS 08/24/2007   NAUSEA AND VOMITING 08/24/2007    PCP: Ivonne Andrew, NP  REFERRING PROVIDER: Unknown Jim, MD  REFERRING DIAG: Bil adhesive capsulitis   THERAPY DIAG:  Chronic pain of both shoulders  Muscle weakness (generalized)  Rationale for Evaluation and Treatment: Rehabilitation  ONSET DATE: Chronic, ongoing for years   SUBJECTIVE:                                                                                                                                                                                     SUBJECTIVE STATEMENT: Patient has not been able to attend PT recently due to illnesses and difficulty with scheduling. She does report her shoulders are feeling pretty good today but she does have good  days and bad days, today is a good day.   PAIN:  Are you having pain? Yes:  NPRS scale: 3/10 Pain location: Bilateral shoulders Pain description: constant aching Aggravating factors: Cooking, washing dishes, grooming, reaching into cabinet Relieving factors: Rest  PERTINENT HISTORY: History of stroke, DVT, CHF, Afib, Neuropathy, neck pain with ACDF C3-5 in 2016, gout, DDD, ESRD, cervical stenosis, dialysis, kidney transplant (failed), see complex medical history above  PRECAUTIONS: See pertinent medical history  WEIGHT BEARING RESTRICTIONS: No  PATIENT GOALS: Improve shoulder pain and ability to reach overhead, put hair in ponytail   OBJECTIVE:  PATIENT SURVEYS:  FOTO 56 % functional status  09/09/2022: 53% 10/14/2022: 55%  POSTURE: Rounded shoulder and forward head posture  UPPER EXTREMITY ROM:   ROM Right eval Left eval Rt / Lt 08/12/2022 Rt / Lt 08/17/2022 R/L 8/7 Rt / Lt 10/14/2022  Shoulder flexion 90 100 105 / 100 95 / 100 90/115 105 / 118  Shoulder extension        Shoulder abduction 65 90      Shoulder adduction        Shoulder internal rotation Reach to lateral iliac crest Reach to PSIS      Shoulder external rotation Unable to reach to head Reach to occiput    Reach to occiput but with significant pain  Elbow flexion        Elbow extension        Wrist  flexion        Wrist extension        Wrist ulnar deviation        Wrist radial deviation        Wrist pronation        Wrist supination        (Blank rows = not tested)  UPPER EXTREMITY MMT:    Strength assessment within available range  MMT Right eval Left eval  Shoulder flexion 3 3  Shoulder extension    Shoulder abduction 3 3  Shoulder adduction    Shoulder internal rotation    Shoulder external rotation    Middle trapezius    Lower trapezius    Elbow flexion    Elbow extension    Wrist flexion    Wrist extension    Wrist ulnar deviation    Wrist radial deviation    Wrist pronation     Wrist supination    Grip strength (lbs)    (Blank rows = not tested)  JOINT MOBILITY TESTING:  Not assessed  PALPATION:  Generalized tenderness of bilateral shoulders    TODAY'S TREATMENT:   [facility] Adult PT Treatment:                                                DATE: 10/14/22 Therapeutic Exercise: Nustep L4 x 3 min, L2 x 2 min with UE/LE while taking subjective, working on shoulder motion Seated overhead pulley for shoulder flexion x 3 min Standing shoulder flexion stretch with physioball on low table 10 x 5 sec Row with red 2 x 10 Extension with yellow 2 x 10 Standing finger ladder x 5 each with 10 sec hold   [facility] Adult PT Treatment:                                                DATE: 09/30/22 Therapeutic Exercise: Nustep L2 x 5 min with UE/LE while taking  Seated overhead pulley for shoulder flexion x 3 min Standing shoulder flexion stretch with physioball 10 x 5 sec Standing row RTB 2 x 10 Stand shoulder extension RTB x10, x7 (pain in Lt shoulder) Reclined table with pillows: Chest press with dowel to overhead reach x 10 Double ER with yellow x 10  Shoulder flexion dowel x 10 partial range due to pain Modalities: MHP applied to shoulders x 10 min post session  [facility] Adult PT Treatment:                                                DATE: 8/7 Therapeutic Exercise: Nustep L5 x 5 min with UE/LE while taking subjective, working on shoulder motion Seated overhead pulley for shoulder elevation x 4 min Seated shoulder flexion stretch with physioball 10 x 5 sec Standing row with red 2 x 10 Standing finger ladder x 5 each Reclined on wedge with pillows: Chest press with dowel to overhead reach 2 x 10 Double ER with yellow 2 x 10 more of an isometric Shoulder flexion dowel x 5 partial range due to pain Therapeutic Activity - collecting information for  goals, checking progress, and reviewing with patient Modalities: MHP applied to shoulders x 10 min post session  [facility]  Adult PT Treatment:                                                DATE: 08/26/22 Therapeutic Exercise: Nustep L5 x 5 min with UE/LE while taking subjective, working on shoulder motion Seated overhead pulley for shoulder elevation x 4 min Seated shoulder flexion stretch with physioball 10 x 5 sec Standing row with red 2 x 10 Standing finger ladder x 5 each Reclined on wedge with pillows: Chest press with dowel to overhead reach 2 x 10 Double ER with yellow 2 x 10 more of an isometric Shoulder flexion dowel x 5 partial range due to pain Modalities: MHP applied to shoulders x 10 min post session     PATIENT EDUCATION: Education details: POC extension, HEP Person educated: Patient Education method: Explanation, Demonstration, Tactile cues, Verbal cues Education comprehension: verbalized understanding, returned demonstration, verbal cues required, tactile cues required, and needs further education  HOME EXERCISE PROGRAM: Access Code: [email or web address]    ASSESSMENT: CLINICAL IMPRESSION: Patient tolerated therapy well with no adverse effects. Therapy limited this visit due to patient requesting to leave early and she has not been able to attend PT consistently due to illnesses and difficulty scheduling. She does exhibit improvement in her bilateral shoulder elevation and ability to reach behind her head with the right arm. Her FOTO score has improved since last assessment but remains lower than initial visit. She reports reduced pain this visit but states it is a good day. Therapy continues to focus on progressing her shoulder motion and strength. She reports pain with all exercises and continues to have difficulty completing her exercises. No changes were made to her HEP this visit. Patient would benefit from continued skilled PT to progress her shoulder mobility and strength in order to reduce pain and maximize functional ability.    OBJECTIVE IMPAIRMENTS: decreased activity tolerance, decreased ROM,  decreased strength, postural dysfunction, and pain.   ACTIVITY LIMITATIONS: carrying, lifting, bathing, dressing, reach over head, and hygiene/grooming  PARTICIPATION LIMITATIONS: meal prep, cleaning, laundry, shopping, and community activity  PERSONAL FACTORS: Fitness, Past/current experiences, Social background, Time since onset of injury/illness/exacerbation, and 3+ comorbidities: see pertinent medical history above  are also affecting patient's functional outcome.    GOALS: Goals reviewed with patient? Yes  SHORT TERM GOALS: Target date: 08/03/2022  Patient will be I with initial HEP in order to progress with therapy. Baseline: HEP provided at eval 08/12/2022: progressing 10/14/2022: independent Goal status: MET  2.  Patient will report bilateral shoulder pain </= 3/10 in order to reduce functional limitations Baseline: patient reports shoulder pain 6/10 08/12/2022: patient reports shoulder pain 3/10 Goal status: MET  LONG TERM GOALS: Target date: 12/09/2022  Patient will be I with final HEP to maintain progress from PT. Baseline: HEP provided at eval 8/7: independent 10/14/2022: progressing Goal status: ONGOING  2.  Patient will report >/= 63% status on FOTO to indicate improved functional ability. Baseline: 56% functional status 8/7: 53 10/14/2022: 55% Goal status: ONGOING  3.  Patient will demonstrate bilateral shoulder AROM flexion >/= 110 deg in order to improve reach into low shelf Baseline: shoulder AROM flexion 90 deg 8/7: R: 90, L 115 10/14/2022: Right 105, Left 118 Goal status: ONGOING  4.  Patient will be able to reach to occiput with right arm to demonstrate improved functional ER and ability to put her hair in a ponytail Baseline: patient unable to perform due to pain and limited motion 8/7: Top of head with significant pain 10/14/2022: able to reach occiput but with significant pain Goal status: PARTIALLY MET   PLAN: PT FREQUENCY: 1x/week  PT DURATION: 8  weeks  PLANNED INTERVENTIONS: Therapeutic exercises, Therapeutic activity, Neuromuscular re-education, Balance training, Gait training, Patient/Family education, Self Care, Joint mobilization, Aquatic Therapy, Dry Needling, Cryotherapy, Moist heat, Manual therapy, and Re-evaluation  PLAN FOR NEXT SESSION: Review HEP and progress PRN, manual/stretching for bilateral shoulders as tolerated, progressive strengthening and ROM exercises   [name], PT, DPT, LAT, ATC 10/14/22  11:06 AM Phone: [phone] Fax: [phone]

## 2022-10-15 NOTE — Telephone Encounter (Signed)
See telephone note dated 10/09/22

## 2022-10-19 ENCOUNTER — Encounter (HOSPITAL_COMMUNITY)
Admission: RE | Admit: 2022-10-19 | Discharge: 2022-10-19 | Disposition: A | Discharge status: Home / Self Care | Payer: Medicare Other | Source: Ambulatory Visit | Attending: Acute Care | Admitting: Acute Care

## 2022-10-19 DIAGNOSIS — R918 Other nonspecific abnormal finding of lung field: Secondary | ICD-10-CM | POA: Insufficient documentation

## 2022-10-19 LAB — GLUCOSE, CAPILLARY: Glucose-Capillary: 78 mg/dL (ref 70–99)

## 2022-10-19 MED ORDER — FLUDEOXYGLUCOSE F - 18 (FDG) INJECTION
5.4700 | Freq: Once | INTRAVENOUS | Status: AC
  Administered 2022-10-19: 5.47 via INTRAVENOUS

## 2022-10-27 ENCOUNTER — Ambulatory Visit: Payer: Medicare Other | Admitting: Emergency Medicine

## 2022-10-27 NOTE — Therapy (Signed)
OUTPATIENT PHYSICAL THERAPY TREATMENT NOTE    Patient Name: Tina Watkins MRN: [id number] DOB:[birth year], 57 y.o., female Today's Date: 10/28/2022   END OF SESSION:  PT End of Session - 10/28/22 0916     Visit Number 9    Number of Visits 16    Date for PT Re-Evaluation 12/09/22    Authorization Type MCR / MCD    Progress Note Due on Visit 10    PT Start Time 0915    PT Stop Time 1005    PT Time Calculation (min) 50 min    Activity Tolerance Patient limited by pain    Behavior During Therapy [facility] for tasks assessed/performed              Past Medical History:  Diagnosis Date   Bacteremia due to Gram-negative bacteria 05/23/2011   Blind    right eye   Blind right eye    CHF (congestive heart failure) (HCC)    Chronic lower back pain    Complication of anesthesia    "woke up during OR; I have an extremely high tolerance" (12/11/2011) 1 procedure was graft; the other procedures were procedures that are typically done with sedation.   DDD (degenerative disc disease), cervical    Depression    Dysrhythmia    "tachycardia" (12/11/2011) new onset afib 10/15/14 EKG   E coli bacteremia 06/18/2011   Elevated LDL cholesterol level 12/2018   ESRD (end stage renal disease) (HCC) 06/12/2011   Fibromyalgia    Gastroparesis    Gastroparesis    GERD (gastroesophageal reflux disease)    Glaucoma    right eye   Gout    H/O carpal tunnel syndrome    Headache 10/2019   Headache(784.0)    "not often anymore" (12/11/2011)   Herpes genitalia 1994   History of blood transfusion    "more than a few times" (12/11/2011)   History of stomach ulcers    Hypotension    Iron deficiency anemia    New onset a-fib (HCC)    10/15/14 EKG   Osteopenia    Peripheral neuropathy 11/2018   Pressure ulcer of sacral region, stage 1 07/2019   Seizures (HCC) 1994   "post transplant; only have had that one" (12/11/2011)   Spinal stenosis in cervical region    Stroke [facility])     left basal ganglia  lacunar infarct; Right frontal lobe lacunar infarct.   Stroke [facility]) ~ 1999; 2001   "briefly lost my vision; lost my right eye" (12/11/2011)   Vitamin D deficiency 12/2018   Past Surgical History:  Procedure Laterality Date   ANTERIOR CERVICAL DECOMP/DISCECTOMY FUSION N/A 01/08/2015   Procedure: Anterior Cervical Three-Four/Four-Five Decompression/Diskectomy/Fusion;  Surgeon: Hilda Lias, MD;  Location: [facility] NEURO ORS;  Service: Neurosurgery;  Laterality: N/A;  C3-4 C4-5 Anterior cervical decompression/diskectomy/fusion   APPENDECTOMY  ~ 2004   BIOPSY  09/12/2020   Procedure: BIOPSY;  Surgeon: Rachael Fee, MD;  Location: [facility] ENDOSCOPY;  Service: Endoscopy;;   CATARACT EXTRACTION     right eye   COLONOSCOPY     COLONOSCOPY WITH PROPOFOL N/A 09/12/2020   Procedure: COLONOSCOPY WITH PROPOFOL;  Surgeon: Rachael Fee, MD;  Location: [facility] ENDOSCOPY;  Service: Endoscopy;  Laterality: N/A;   ENUCLEATION  2001   "right"   ESOPHAGOGASTRODUODENOSCOPY (EGD) WITH PROPOFOL N/A 04/21/2012   Procedure: ESOPHAGOGASTRODUODENOSCOPY (EGD) WITH PROPOFOL;  Surgeon: Rachael Fee, MD;  Location: [facility] ENDOSCOPY;  Service: Endoscopy;  Laterality: N/A;   ESOPHAGOGASTRODUODENOSCOPY (EGD) WITH  PROPOFOL N/A 09/12/2020   Procedure: ESOPHAGOGASTRODUODENOSCOPY (EGD) WITH PROPOFOL;  Surgeon: Rachael Fee, MD;  Location: [facility] ENDOSCOPY;  Service: Endoscopy;  Laterality: N/A;   INSERTION OF DIALYSIS CATHETER  1988   "AV graft LUA & LFA; LUA worked for 1 day; LFA never worked"   IR FLUORO GUIDE CV LINE RIGHT  07/11/2019   IR FLUORO GUIDE CV LINE RIGHT  10/20/2019   IR FLUORO GUIDE CV LINE RIGHT  05/31/2020   IR PTA VENOUS EXCEPT DIALYSIS CIRCUIT  05/31/2020   IR RADIOLOGY PERIPHERAL GUIDED IV START  07/11/2019   IR US GUIDE VASC ACCESS RIGHT  07/11/2019   IR US GUIDE VASC ACCESS RIGHT  07/11/2019   IR US GUIDE VASC ACCESS RIGHT  07/11/2019   IR VENOCAVAGRAM IVC  05/31/2020   KIDNEY TRANSPLANT  1994; 1999; 2005   "right"    MULTIPLE TOOTH EXTRACTIONS     POLYPECTOMY  09/12/2020   Procedure: POLYPECTOMY;  Surgeon: Rachael Fee, MD;  Location: [facility] ENDOSCOPY;  Service: Endoscopy;;   RIGHT HEART CATH N/A 03/23/2019   Procedure: RIGHT HEART CATH;  Surgeon: Dolores Patty, MD;  Location: [facility] INVASIVE CV LAB;  Service: Cardiovascular;  Laterality: N/A;   TONSILLECTOMY     TOTAL NEPHRECTOMY  1988?; 1994; 2005   Patient Active Problem List   Diagnosis Date Noted   COVID-19 10/02/2022   Encounter for monitoring amiodarone therapy 07/01/2022   Atrial fibrillation (HCC) 07/01/2022   Bilateral hip pain 01/14/2022   Prolonged Q-T interval on ECG 09/07/2021   Nausea & vomiting 09/06/2021   Bilateral shoulder pain 06/06/2021   Bilateral hand swelling 06/06/2021   Cervical radiculitis 01/24/2021   Acute respiratory failure with hypoxia (HCC) 10/23/2020   Coagulation defect, unspecified (HCC) 08/01/2020   Aortic atherosclerosis (HCC) 07/10/2020   Hypercoagulable state due to atrial fibrillation (HCC) 04/23/2020   Atrial flutter (HCC) 03/29/2020   Chronic pain of both knees 03/13/2020   Chills (without fever) 12/12/2019   Other pancytopenia (HCC)    Decubitus ulcer of sacral region, stage 1 08/18/2019   HCAP (healthcare-associated pneumonia) 08/05/2019   Pressure injury of skin 07/24/2019   Chest congestion 07/24/2019   Itching 07/24/2019   Weakness 06/13/2019   Pneumonia due to COVID-19 virus 05/09/2019   Hypotension 05/09/2019   Symptomatic anemia 05/09/2019   CHF (congestive heart failure) (HCC) 05/09/2019   Swollen abdomen 01/04/2019   DDD (degenerative disc disease), cervical 12/06/2018   Neuropathy 12/06/2018   Paresthesia 10/11/2018   Neck pain 10/11/2018   Tobacco abuse 11/20/2017   Right leg swelling 11/20/2017   Right leg pain 11/20/2017   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   GERD (gastroesophageal reflux disease) 11/20/2017   Acute venous embolism and thrombosis of deep vessels of proximal  lower extremity, right (HCC) 11/20/2017   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    Thrush, oral    AKI (acute kidney injury) (HCC)    Multifocal pneumonia 08/09/2017   ETD (Eustachian tube dysfunction), bilateral 08/03/2017   Acute recurrent pansinusitis 07/21/2017   Chest pain 09/29/2015   Cervical stenosis of spinal canal 01/08/2015   Urinary tract infectious disease    Muscle spasms of neck 06/15/2014   Bleeding hemorrhoid 06/15/2014   Anemia in chronic kidney disease 06/15/2014   Pyelonephritis, acute 06/13/2014   Sepsis (HCC) 06/13/2014   History of kidney transplant    Abnormal CT scan 09/14/2012   Chronic pain syndrome 04/09/2012   Dehydration,  mild 04/09/2012   Herpes infection 06/23/2011   Anxiety 06/23/2011   E coli bacteremia 06/18/2011   ESRD (end stage renal disease) (HCC) 06/12/2011   UTI (lower urinary tract infection) 06/12/2011   Bacteremia due to Gram-negative bacteria 05/23/2011   History of renal transplantation 05/22/2011   Septic shock(785.52) 05/22/2011   Acute on chronic kidney failure (HCC) 05/22/2011   Gastroparesis 04/24/2008   WEIGHT LOSS 08/24/2007   NAUSEA AND VOMITING 08/24/2007    PCP: Ivonne Andrew, NP  REFERRING PROVIDER: Unknown Jim, MD  REFERRING DIAG: Bil adhesive capsulitis   THERAPY DIAG:  Chronic pain of both shoulders  Muscle weakness (generalized)  Rationale for Evaluation and Treatment: Rehabilitation  ONSET DATE: Chronic, ongoing for years   SUBJECTIVE:                                                                                                                                                                                     SUBJECTIVE STATEMENT: Patient reports that she was sick over the weekend and still doesn't feel great and that her shoulders are at a 10/10 on pain scale. She states that she receives injections in BIL shoulders this afternoon. It has been 3  months since the last round of injections. She says she is having particular trouble with with reaching across her body, especially when trying to apply deodorant.  PAIN:  Are you having pain? Yes:  NPRS scale: 10/10 Pain location: Bilateral shoulders Pain description: constant aching Aggravating factors: Cooking, washing dishes, grooming, reaching into cabinet Relieving factors: Rest  PERTINENT HISTORY: History of stroke, DVT, CHF, Afib, Neuropathy, neck pain with ACDF C3-5 in 2016, gout, DDD, ESRD, cervical stenosis, dialysis, kidney transplant (failed), see complex medical history above  PRECAUTIONS: See pertinent medical history  WEIGHT BEARING RESTRICTIONS: No  PATIENT GOALS: Improve shoulder pain and ability to reach overhead, put hair in ponytail   OBJECTIVE:  PATIENT SURVEYS:  FOTO 56 % functional status  09/09/2022: 53% 10/14/2022: 55%  POSTURE: Rounded shoulder and forward head posture  UPPER EXTREMITY ROM:   ROM Right eval Left eval Rt / Lt 08/12/2022 Rt / Lt 08/17/2022 R/L 8/7 Rt / Lt 10/14/2022  Shoulder flexion 90 100 105 / 100 95 / 100 90/115 105 / 118  Shoulder extension        Shoulder abduction 65 90      Shoulder adduction        Shoulder internal rotation Reach to lateral iliac crest Reach to PSIS      Shoulder external rotation Unable to reach to head Reach to occiput    Reach to occiput but with significant pain  Elbow  flexion        Elbow extension        Wrist flexion        Wrist extension        Wrist ulnar deviation        Wrist radial deviation        Wrist pronation        Wrist supination        (Blank rows = not tested)  UPPER EXTREMITY MMT:    Strength assessment within available range  MMT Right eval Left eval  Shoulder flexion 3 3  Shoulder extension    Shoulder abduction 3 3  Shoulder adduction    Shoulder internal rotation    Shoulder external rotation    Middle trapezius    Lower trapezius    Elbow flexion    Elbow  extension    Wrist flexion    Wrist extension    Wrist ulnar deviation    Wrist radial deviation    Wrist pronation    Wrist supination    Grip strength (lbs)    (Blank rows = not tested)  JOINT MOBILITY TESTING:  Not assessed  PALPATION:  Generalized tenderness of bilateral shoulders    TODAY'S TREATMENT:   [facility] Adult PT Treatment:                                                DATE: 10/27/22 Therapeutic Exercise: Nustep L3 x 6 min with UE/LE while taking subjective, working on shoulder motion Seated overhead pulley for shoulder flexion x 3 min Reclined table: Dowel AAROM ER/IR ER/IR x10 each BIL clinician resistance YTB Double ER with yellow 2x10 Shoulder flexion with dowel very small range x10 Modalities: MHP to BIL shoulders, pt reclined on table x10 mins post session   [facility] Adult PT Treatment:                                                DATE: 10/14/22 Therapeutic Exercise: Nustep L4 x 3 min, L2 x 2 min with UE/LE while taking subjective, working on shoulder motion Seated overhead pulley for shoulder flexion x 3 min Standing shoulder flexion stretch with physioball on low table 10 x 5 sec Row with red 2 x 10 Extension with yellow 2 x 10 Standing finger ladder x 5 each with 10 sec hold   [facility] Adult PT Treatment:                                                DATE: 09/30/22 Therapeutic Exercise: Nustep L2 x 5 min with UE/LE while taking  Seated overhead pulley for shoulder flexion x 3 min Standing shoulder flexion stretch with physioball 10 x 5 sec Standing row RTB 2 x 10 Stand shoulder extension RTB x10, x7 (pain in Lt shoulder) Reclined table with pillows: Chest press with dowel to overhead reach x 10 Double ER with yellow x 10  Shoulder flexion dowel x 10 partial range due to pain Modalities: MHP applied to shoulders x 10 min post session        PATIENT  EDUCATION: Education details: POC extension, HEP Person educated: Patient Education method:  Explanation, Demonstration, Tactile cues, Verbal cues Education comprehension: verbalized understanding, returned demonstration, verbal cues required, tactile cues required, and needs further education  HOME EXERCISE PROGRAM: Access Code: [email or web address]    ASSESSMENT: CLINICAL IMPRESSION: Patient presents to PT reporting that she was sick over the weekend and that her shoulders are at a 10/10 today. Session today continued to focus on RTC and periscapular strengthening and shoulder ROM within her pain tolerance, though she was limited especially by pain today. Patient continues to benefit from skilled PT services and should be progressed as able to improve functional independence.    OBJECTIVE IMPAIRMENTS: decreased activity tolerance, decreased ROM, decreased strength, postural dysfunction, and pain.   ACTIVITY LIMITATIONS: carrying, lifting, bathing, dressing, reach over head, and hygiene/grooming  PARTICIPATION LIMITATIONS: meal prep, cleaning, laundry, shopping, and community activity  PERSONAL FACTORS: Fitness, Past/current experiences, Social background, Time since onset of injury/illness/exacerbation, and 3+ comorbidities: see pertinent medical history above  are also affecting patient's functional outcome.    GOALS: Goals reviewed with patient? Yes  SHORT TERM GOALS: Target date: 08/03/2022  Patient will be I with initial HEP in order to progress with therapy. Baseline: HEP provided at eval 08/12/2022: progressing 10/14/2022: independent Goal status: MET  2.  Patient will report bilateral shoulder pain </= 3/10 in order to reduce functional limitations Baseline: patient reports shoulder pain 6/10 08/12/2022: patient reports shoulder pain 3/10 Goal status: MET  LONG TERM GOALS: Target date: 12/09/2022  Patient will be I with final HEP to maintain progress from PT. Baseline: HEP provided at eval 8/7: independent 10/14/2022: progressing Goal status: ONGOING  2.  Patient will  report >/= 63% status on FOTO to indicate improved functional ability. Baseline: 56% functional status 8/7: 53 10/14/2022: 55% Goal status: ONGOING  3.  Patient will demonstrate bilateral shoulder AROM flexion >/= 110 deg in order to improve reach into low shelf Baseline: shoulder AROM flexion 90 deg 8/7: R: 90, L 115 10/14/2022: Right 105, Left 118 Goal status: ONGOING  4.  Patient will be able to reach to occiput with right arm to demonstrate improved functional ER and ability to put her hair in a ponytail Baseline: patient unable to perform due to pain and limited motion 8/7: Top of head with significant pain 10/14/2022: able to reach occiput but with significant pain Goal status: PARTIALLY MET   PLAN: PT FREQUENCY: 1x/week  PT DURATION: 8 weeks  PLANNED INTERVENTIONS: Therapeutic exercises, Therapeutic activity, Neuromuscular re-education, Balance training, Gait training, Patient/Family education, Self Care, Joint mobilization, Aquatic Therapy, Dry Needling, Cryotherapy, Moist heat, Manual therapy, and Re-evaluation  PLAN FOR NEXT SESSION: Review HEP and progress PRN, manual/stretching for bilateral shoulders as tolerated, progressive strengthening and ROM exercises  Berta Minor PTA 10/28/22  9:57 AM Phone: [phone] Fax: [phone]

## 2022-10-28 ENCOUNTER — Encounter: Payer: Self-pay | Admitting: Emergency Medicine

## 2022-10-28 ENCOUNTER — Ambulatory Visit: Payer: Medicare Other

## 2022-10-28 ENCOUNTER — Ambulatory Visit: Payer: Medicare Other | Admitting: Emergency Medicine

## 2022-10-28 VITALS — BP 98/62 | HR 70 | Ht 59.0 in | Wt 109.2 lb

## 2022-10-28 DIAGNOSIS — G8929 Other chronic pain: Secondary | ICD-10-CM

## 2022-10-28 DIAGNOSIS — K869 Disease of pancreas, unspecified: Secondary | ICD-10-CM

## 2022-10-28 DIAGNOSIS — R911 Solitary pulmonary nodule: Secondary | ICD-10-CM | POA: Diagnosis not present

## 2022-10-28 DIAGNOSIS — M25511 Pain in right shoulder: Secondary | ICD-10-CM | POA: Diagnosis not present

## 2022-10-28 DIAGNOSIS — C3410 Malignant neoplasm of upper lobe, unspecified bronchus or lung: Secondary | ICD-10-CM | POA: Insufficient documentation

## 2022-10-28 DIAGNOSIS — M6281 Muscle weakness (generalized): Secondary | ICD-10-CM

## 2022-10-28 NOTE — Progress Notes (Signed)
Subjective:    Patient ID: Tina Watkins, female    DOB: 1965-12-17, 57 y.o.   MRN: [id number]  HPI 57 year old woman with a history of tobacco use who has been followed by Dr. Francine Graven in our office.  She has COPD, history of end-stage renal disease on HD, GERD, A-fib, systolic CHF and history of CVA.  She participates in lung cancer screening program, most recent CT done on 09/28/2022 as below.  She is here to review that scan and subsequent PET scan done 10/19/2022 She has been having a lot of cough, can wake her from sleep. Started in June. Intermittent mucous production, no hemoptysis. Still smoking about 5-6 cig / day.   Lung cancer screening CT chest 09/28/2022 reviewed by me shows slightly enlarged 1.1 cm right paratracheal node that is stable from prior, no hilar adenopathy.  She has diffuse bilateral bronchial wall thickening.  There is a new solid left upper lobe pulmonary nodule 2.1 cm and several new clustered solid pulmonary nodules in the left upper lobe 2-5 mm.  Also noted were 2 cystic pancreatic tail lesions, largest 2.6 cm, MRI abdomen was recommended.    PET scan 10/19/2022 reviewed by me shows the left upper lobe nodule is unchanged in size and appearance.  There is moderate hypermetabolism associated with the nodule.  I do not see any mediastinal or hilar lymphadenopathy.  The final read on the PET scan is pending  Pulmonary function testing done 07/02/2021 reviewed by me shows possible mild restriction on spirometry with a normal FEV1.  Her lung volumes show possible hyperinflation based on elevated RV.  The diffusion capacity is decreased but corrects to the normal range when adjusted for alveolar volume.   Review of Systems As per HPI  Past Medical History:  Diagnosis Date   Bacteremia due to Gram-negative bacteria 05/23/2011   Blind    right eye   Blind right eye    CHF (congestive heart failure) (HCC)    Chronic lower back pain    Complication of anesthesia     "woke up during OR; I have an extremely high tolerance" (12/11/2011) 1 procedure was graft; the other procedures were procedures that are typically done with sedation.   DDD (degenerative disc disease), cervical    Depression    Dysrhythmia    "tachycardia" (12/11/2011) new onset afib 10/15/14 EKG   E coli bacteremia 06/18/2011   Elevated LDL cholesterol level 12/2018   ESRD (end stage renal disease) (HCC) 06/12/2011   Fibromyalgia    Gastroparesis    Gastroparesis    GERD (gastroesophageal reflux disease)    Glaucoma    right eye   Gout    H/O carpal tunnel syndrome    Headache 10/2019   Headache(784.0)    "not often anymore" (12/11/2011)   Herpes genitalia 1994   History of blood transfusion    "more than a few times" (12/11/2011)   History of stomach ulcers    Hypotension    Iron deficiency anemia    New onset a-fib (HCC)    10/15/14 EKG   Osteopenia    Peripheral neuropathy 11/2018   Pressure ulcer of sacral region, stage 1 07/2019   Seizures (HCC) 1994   "post transplant; only have had that one" (12/11/2011)   Spinal stenosis in cervical region    Stroke [facility])     left basal ganglia lacunar infarct; Right frontal lobe lacunar infarct.   Stroke [facility]) ~ 1999; 2001   "briefly lost my  vision; lost my right eye" (12/11/2011)   Vitamin D deficiency 12/2018     Family History  Problem Relation Age of Onset   Glaucoma Mother    Pancreatic cancer Father    Multiple sclerosis Brother    Diabetes Maternal Uncle    Hypertension Maternal Grandmother    Breast cancer Neg Hx      Social History   Socioeconomic History   Marital status: Single    Spouse name: Not on file   Number of children: 0   Years of education: some college   Highest education level: Some college, no degree  Occupational History   Occupation: disabled    Associate Professor: UNEMPLOYED  Tobacco Use   Smoking status: Every Day    Current packs/day: 1.50    Average packs/day: 1.5 packs/day for 35.0 years (52.5 ttl  pk-yrs)    Types: Cigarettes    Passive exposure: Never   Smokeless tobacco: Never   Tobacco comments:    Pack of cigarettes lasts 2 days 09/18/2020  Vaping Use   Vaping status: Never Used  Substance and Sexual Activity   Alcohol use: Not Currently    Comment: rarely   Drug use: Yes    Frequency: 1.0 times per week    Types: Marijuana    Comment: occ   Sexual activity: Not Currently    Partners: Male    Birth control/protection: None  Other Topics Concern   Not on file  Social History Narrative   Right-handed.   Four cups caffeine per day.   Lives alone.   Social Determinants of Health   Financial Resource Strain: Low Risk  (06/25/2022)   Overall Financial Resource Strain (CARDIA)    Difficulty of Paying Living Expenses: Not hard at all  Food Insecurity: No Food Insecurity (06/25/2022)   Hunger Vital Sign    Worried About Running Out of Food in the Last Year: Never true    Ran Out of Food in the Last Year: Never true  Transportation Needs: No Transportation Needs (06/21/2022)   PRAPARE - Administrator, Civil Service (Medical): No    Lack of Transportation (Non-Medical): No  Physical Activity: Inactive (06/25/2022)   Exercise Vital Sign    Days of Exercise per Week: 0 days    Minutes of Exercise per Session: 0 min  Stress: Stress Concern Present (06/25/2022)   [employer] of Occupational Health - Occupational Stress Questionnaire    Feeling of Stress : Very much  Social Connections: Socially Isolated (06/25/2022)   Social Connection and Isolation Panel [NHANES]    Frequency of Communication with Friends and Family: More than three times a week    Frequency of Social Gatherings with Friends and Family: Once a week    Attends Religious Services: Never    Database administrator or Organizations: No    Attends Banker Meetings: Never    Marital Status: Never married  Intimate Partner Violence: Not At Risk (06/25/2022)   Humiliation, Afraid,  Rape, and Kick questionnaire    Fear of Current or Ex-Partner: No    Emotionally Abused: No    Physically Abused: No    Sexually Abused: No     Allergies  Allergen Reactions   Levofloxacin Itching and Rash   Tape Other (See Comments)    "Certain surgical tapes peel off my skin"     Outpatient Medications Prior to Visit  Medication Sig Dispense Refill   allopurinol (ZYLOPRIM) 100 MG tablet Take 1  tablet (100 mg total) by mouth daily. 90 tablet 3   allopurinol (ZYLOPRIM) 100 MG tablet TAKE 1 TABLET BY MOUTH EVERY DAY 90 tablet 3   amiodarone (PACERONE) 200 MG tablet Take 0.5 tablets (100 mg total) by mouth daily. 90 tablet 1   apixaban (ELIQUIS) 2.5 MG TABS tablet TAKE 1 TABLET BY MOUTH TWICE A DAY 60 tablet 11   Colchicine 0.6 MG CAPS Take 0.6 mg by mouth daily as needed (gout flare up). 30 capsule 2   cyclobenzaprine (FLEXERIL) 5 MG tablet Take 1 tablet (5 mg total) by mouth at bedtime as needed. 90 tablet 1   diclofenac Sodium (VOLTAREN) 1 % GEL Apply 4 g topically as needed. 50 g 0   diphenhydrAMINE (BENADRYL) 25 mg capsule Take 1 capsule (25 mg total) by mouth every 8 (eight) hours as needed. 30 capsule 6   dorzolamide-timolol (COSOPT) 2-0.5 % ophthalmic solution Place 1 drop into the left eye 2 (two) times daily.     escitalopram (LEXAPRO) 20 MG tablet Take 20 mg by mouth daily.     famotidine (PEPCID) 20 MG tablet TAKE 1 TABLET BY MOUTH TWICE A DAY 180 tablet 3   fluticasone (FLONASE) 50 MCG/ACT nasal spray Place 1 spray into both nostrils daily as needed for allergies or rhinitis.     fluticasone-salmeterol (ADVAIR HFA) 115-21 MCG/ACT inhaler Inhale 2 puffs into the lungs 2 (two) times daily. 1 each 12   gabapentin (NEURONTIN) 100 MG capsule Take 100 mg by mouth 3 (three) times daily.     lidocaine (LIDODERM) 5 % Place 1 patch onto the skin daily as needed (for pain- remove old patch first).      metoCLOPramide (REGLAN) 5 MG tablet Take 1 tablet (5 mg total) by mouth 2 (two)  times daily before a meal. 180 tablet 3   midodrine (PROAMATINE) 10 MG tablet Take 1.5 tablets (15 mg total) by mouth 3 (three) times daily. NEEDS FOLLOW UP APPOINTMENT FOR ANYMORE REFILLS 405 tablet 0   omeprazole (PRILOSEC) 40 MG capsule TAKE 1 CAPSULE (40 MG TOTAL) BY MOUTH DAILY. 90 capsule 1   ondansetron (ZOFRAN-ODT) 8 MG disintegrating tablet Take 8 mg by mouth as needed for nausea or vomiting.     oxyCODONE-acetaminophen (PERCOCET) 10-325 MG tablet Take 1 tablet by mouth 4 (four) times daily as needed for pain.     VELPHORO 500 MG chewable tablet Chew 500 mg by mouth 3 (three) times daily.     Vitamin D, Ergocalciferol, (DRISDOL) 1.25 MG (50000 UNIT) CAPS capsule TAKE 1 CAPSULE (50,000 UNITS TOTAL) BY MOUTH EVERY 7 (SEVEN) DAYS 5 capsule 6   zolpidem (AMBIEN) 10 MG tablet Take 1 tablet (10 mg total) by mouth at bedtime as needed for sleep. 10 tablet 0   No facility-administered medications prior to visit.        Objective:   Physical Exam Vitals:   10/28/22 1103  BP: 98/62  Pulse: 70  SpO2: 97%  Weight: 109 lb 3.2 oz (49.5 kg)  Height: 4\' 11"  (1.499 m)   Gen: Pleasant, chronically ill, in no distress,  normal affect  ENT: Disconjugate gaze, oropharynx clear  Neck: No JVD, no stridor  Lungs: No use of accessory muscles, distant but no wheezes  Cardiovascular: RRR, heart sounds normal, no murmur or gallops, no peripheral edema  Musculoskeletal: No deformities, no cyanosis or clubbing  Neuro: alert, awake, non focal  Skin: Warm, no lesions or rash      Assessment & Plan:  Pulmonary nodule 1 cm or greater in diameter We reviewed your CT scans of the chest and your PET scan today. We will arrange for a navigational bronchoscopy to be done under general anesthesia as an outpatient at [facility] endoscopy.  We will try to get this scheduled for 11/16/2022.  You will need a designated driver and someone to watch you at home on that day after the procedure.  You will need  to stop your Eliquis 2 days prior. We will arrange for repeat CT scan of your chest in preparation for the procedure We will arrange for an MRI of the abdomen with contrast Continue your other medications as you have been taking them Follow with T. Parrett in 1 month in our office   Levy Pupa, MD, PhD 10/28/2022, 4:33 PM [facility] [phone] or if no answer before 7:00PM call [phone] For any issues after 7:00PM please call eLink [phone]

## 2022-10-28 NOTE — Telephone Encounter (Signed)
Patient has been scheduled for a nav bronch on 10/14 with Dr. Delton Coombes.

## 2022-10-28 NOTE — H&P (View-Only) (Signed)
Subjective:    Patient ID: Tina Watkins, female    DOB: 1965-12-17, 57 y.o.   MRN: [id number]  HPI 57 year old woman with a history of tobacco use who has been followed by Dr. Francine Graven in our office.  She has COPD, history of end-stage renal disease on HD, GERD, A-fib, systolic CHF and history of CVA.  She participates in lung cancer screening program, most recent CT done on 09/28/2022 as below.  She is here to review that scan and subsequent PET scan done 10/19/2022 She has been having a lot of cough, can wake her from sleep. Started in June. Intermittent mucous production, no hemoptysis. Still smoking about 5-6 cig / day.   Lung cancer screening CT chest 09/28/2022 reviewed by me shows slightly enlarged 1.1 cm right paratracheal node that is stable from prior, no hilar adenopathy.  She has diffuse bilateral bronchial wall thickening.  There is a new solid left upper lobe pulmonary nodule 2.1 cm and several new clustered solid pulmonary nodules in the left upper lobe 2-5 mm.  Also noted were 2 cystic pancreatic tail lesions, largest 2.6 cm, MRI abdomen was recommended.    PET scan 10/19/2022 reviewed by me shows the left upper lobe nodule is unchanged in size and appearance.  There is moderate hypermetabolism associated with the nodule.  I do not see any mediastinal or hilar lymphadenopathy.  The final read on the PET scan is pending  Pulmonary function testing done 07/02/2021 reviewed by me shows possible mild restriction on spirometry with a normal FEV1.  Her lung volumes show possible hyperinflation based on elevated RV.  The diffusion capacity is decreased but corrects to the normal range when adjusted for alveolar volume.   Review of Systems As per HPI  Past Medical History:  Diagnosis Date   Bacteremia due to Gram-negative bacteria 05/23/2011   Blind    right eye   Blind right eye    CHF (congestive heart failure) (HCC)    Chronic lower back pain    Complication of anesthesia     "woke up during OR; I have an extremely high tolerance" (12/11/2011) 1 procedure was graft; the other procedures were procedures that are typically done with sedation.   DDD (degenerative disc disease), cervical    Depression    Dysrhythmia    "tachycardia" (12/11/2011) new onset afib 10/15/14 EKG   E coli bacteremia 06/18/2011   Elevated LDL cholesterol level 12/2018   ESRD (end stage renal disease) (HCC) 06/12/2011   Fibromyalgia    Gastroparesis    Gastroparesis    GERD (gastroesophageal reflux disease)    Glaucoma    right eye   Gout    H/O carpal tunnel syndrome    Headache 10/2019   Headache(784.0)    "not often anymore" (12/11/2011)   Herpes genitalia 1994   History of blood transfusion    "more than a few times" (12/11/2011)   History of stomach ulcers    Hypotension    Iron deficiency anemia    New onset a-fib (HCC)    10/15/14 EKG   Osteopenia    Peripheral neuropathy 11/2018   Pressure ulcer of sacral region, stage 1 07/2019   Seizures (HCC) 1994   "post transplant; only have had that one" (12/11/2011)   Spinal stenosis in cervical region    Stroke [facility])     left basal ganglia lacunar infarct; Right frontal lobe lacunar infarct.   Stroke [facility]) ~ 1999; 2001   "briefly lost my  vision; lost my right eye" (12/11/2011)   Vitamin D deficiency 12/2018     Family History  Problem Relation Age of Onset   Glaucoma Mother    Pancreatic cancer Father    Multiple sclerosis Brother    Diabetes Maternal Uncle    Hypertension Maternal Grandmother    Breast cancer Neg Hx      Social History   Socioeconomic History   Marital status: Single    Spouse name: Not on file   Number of children: 0   Years of education: some college   Highest education level: Some college, no degree  Occupational History   Occupation: disabled    Associate Professor: UNEMPLOYED  Tobacco Use   Smoking status: Every Day    Current packs/day: 1.50    Average packs/day: 1.5 packs/day for 35.0 years (52.5 ttl  pk-yrs)    Types: Cigarettes    Passive exposure: Never   Smokeless tobacco: Never   Tobacco comments:    Pack of cigarettes lasts 2 days 09/18/2020  Vaping Use   Vaping status: Never Used  Substance and Sexual Activity   Alcohol use: Not Currently    Comment: rarely   Drug use: Yes    Frequency: 1.0 times per week    Types: Marijuana    Comment: occ   Sexual activity: Not Currently    Partners: Male    Birth control/protection: None  Other Topics Concern   Not on file  Social History Narrative   Right-handed.   Four cups caffeine per day.   Lives alone.   Social Determinants of Health   Financial Resource Strain: Low Risk  (06/25/2022)   Overall Financial Resource Strain (CARDIA)    Difficulty of Paying Living Expenses: Not hard at all  Food Insecurity: No Food Insecurity (06/25/2022)   Hunger Vital Sign    Worried About Running Out of Food in the Last Year: Never true    Ran Out of Food in the Last Year: Never true  Transportation Needs: No Transportation Needs (06/21/2022)   PRAPARE - Administrator, Civil Service (Medical): No    Lack of Transportation (Non-Medical): No  Physical Activity: Inactive (06/25/2022)   Exercise Vital Sign    Days of Exercise per Week: 0 days    Minutes of Exercise per Session: 0 min  Stress: Stress Concern Present (06/25/2022)   [employer] of Occupational Health - Occupational Stress Questionnaire    Feeling of Stress : Very much  Social Connections: Socially Isolated (06/25/2022)   Social Connection and Isolation Panel [NHANES]    Frequency of Communication with Friends and Family: More than three times a week    Frequency of Social Gatherings with Friends and Family: Once a week    Attends Religious Services: Never    Database administrator or Organizations: No    Attends Banker Meetings: Never    Marital Status: Never married  Intimate Partner Violence: Not At Risk (06/25/2022)   Humiliation, Afraid,  Rape, and Kick questionnaire    Fear of Current or Ex-Partner: No    Emotionally Abused: No    Physically Abused: No    Sexually Abused: No     Allergies  Allergen Reactions   Levofloxacin Itching and Rash   Tape Other (See Comments)    "Certain surgical tapes peel off my skin"     Outpatient Medications Prior to Visit  Medication Sig Dispense Refill   allopurinol (ZYLOPRIM) 100 MG tablet Take 1  tablet (100 mg total) by mouth daily. 90 tablet 3   allopurinol (ZYLOPRIM) 100 MG tablet TAKE 1 TABLET BY MOUTH EVERY DAY 90 tablet 3   amiodarone (PACERONE) 200 MG tablet Take 0.5 tablets (100 mg total) by mouth daily. 90 tablet 1   apixaban (ELIQUIS) 2.5 MG TABS tablet TAKE 1 TABLET BY MOUTH TWICE A DAY 60 tablet 11   Colchicine 0.6 MG CAPS Take 0.6 mg by mouth daily as needed (gout flare up). 30 capsule 2   cyclobenzaprine (FLEXERIL) 5 MG tablet Take 1 tablet (5 mg total) by mouth at bedtime as needed. 90 tablet 1   diclofenac Sodium (VOLTAREN) 1 % GEL Apply 4 g topically as needed. 50 g 0   diphenhydrAMINE (BENADRYL) 25 mg capsule Take 1 capsule (25 mg total) by mouth every 8 (eight) hours as needed. 30 capsule 6   dorzolamide-timolol (COSOPT) 2-0.5 % ophthalmic solution Place 1 drop into the left eye 2 (two) times daily.     escitalopram (LEXAPRO) 20 MG tablet Take 20 mg by mouth daily.     famotidine (PEPCID) 20 MG tablet TAKE 1 TABLET BY MOUTH TWICE A DAY 180 tablet 3   fluticasone (FLONASE) 50 MCG/ACT nasal spray Place 1 spray into both nostrils daily as needed for allergies or rhinitis.     fluticasone-salmeterol (ADVAIR HFA) 115-21 MCG/ACT inhaler Inhale 2 puffs into the lungs 2 (two) times daily. 1 each 12   gabapentin (NEURONTIN) 100 MG capsule Take 100 mg by mouth 3 (three) times daily.     lidocaine (LIDODERM) 5 % Place 1 patch onto the skin daily as needed (for pain- remove old patch first).      metoCLOPramide (REGLAN) 5 MG tablet Take 1 tablet (5 mg total) by mouth 2 (two)  times daily before a meal. 180 tablet 3   midodrine (PROAMATINE) 10 MG tablet Take 1.5 tablets (15 mg total) by mouth 3 (three) times daily. NEEDS FOLLOW UP APPOINTMENT FOR ANYMORE REFILLS 405 tablet 0   omeprazole (PRILOSEC) 40 MG capsule TAKE 1 CAPSULE (40 MG TOTAL) BY MOUTH DAILY. 90 capsule 1   ondansetron (ZOFRAN-ODT) 8 MG disintegrating tablet Take 8 mg by mouth as needed for nausea or vomiting.     oxyCODONE-acetaminophen (PERCOCET) 10-325 MG tablet Take 1 tablet by mouth 4 (four) times daily as needed for pain.     VELPHORO 500 MG chewable tablet Chew 500 mg by mouth 3 (three) times daily.     Vitamin D, Ergocalciferol, (DRISDOL) 1.25 MG (50000 UNIT) CAPS capsule TAKE 1 CAPSULE (50,000 UNITS TOTAL) BY MOUTH EVERY 7 (SEVEN) DAYS 5 capsule 6   zolpidem (AMBIEN) 10 MG tablet Take 1 tablet (10 mg total) by mouth at bedtime as needed for sleep. 10 tablet 0   No facility-administered medications prior to visit.        Objective:   Physical Exam Vitals:   10/28/22 1103  BP: 98/62  Pulse: 70  SpO2: 97%  Weight: 109 lb 3.2 oz (49.5 kg)  Height: 4\' 11"  (1.499 m)   Gen: Pleasant, chronically ill, in no distress,  normal affect  ENT: Disconjugate gaze, oropharynx clear  Neck: No JVD, no stridor  Lungs: No use of accessory muscles, distant but no wheezes  Cardiovascular: RRR, heart sounds normal, no murmur or gallops, no peripheral edema  Musculoskeletal: No deformities, no cyanosis or clubbing  Neuro: alert, awake, non focal  Skin: Warm, no lesions or rash      Assessment & Plan:  Pulmonary nodule 1 cm or greater in diameter We reviewed your CT scans of the chest and your PET scan today. We will arrange for a navigational bronchoscopy to be done under general anesthesia as an outpatient at [facility] endoscopy.  We will try to get this scheduled for 11/16/2022.  You will need a designated driver and someone to watch you at home on that day after the procedure.  You will need  to stop your Eliquis 2 days prior. We will arrange for repeat CT scan of your chest in preparation for the procedure We will arrange for an MRI of the abdomen with contrast Continue your other medications as you have been taking them Follow with T. Parrett in 1 month in our office   Levy Pupa, MD, PhD 10/28/2022, 4:33 PM [facility] [phone] or if no answer before 7:00PM call [phone] For any issues after 7:00PM please call eLink [phone]

## 2022-10-28 NOTE — Assessment & Plan Note (Signed)
>>  ASSESSMENT AND PLAN FOR PULMONARY NODULE 1 CM OR GREATER IN DIAMETER WRITTEN ON 10/28/2022  4:32 PM BY BYRUM, Les Pou, MD  We reviewed your CT scans of the chest and your PET scan today. We will arrange for a navigational bronchoscopy to be done under general anesthesia as an outpatient at [facility] endoscopy.  We will try to get this scheduled for 11/16/2022.  You will need a designated driver and someone to watch you at home on that day after the procedure.  You will need to stop your Eliquis 2 days prior. We will arrange for repeat CT scan of your chest in preparation for the procedure We will arrange for an MRI of the abdomen with contrast Continue your other medications as you have been taking them Follow with T. Parrett in 1 month in our office

## 2022-10-28 NOTE — Patient Instructions (Addendum)
We reviewed your CT scans of the chest and your PET scan today. We will arrange for a navigational bronchoscopy to be done under general anesthesia as an outpatient at [facility] endoscopy.  We will try to get this scheduled for 11/16/2022.  You will need a designated driver and someone to watch you at home on that day after the procedure.  You will need to stop your Eliquis 2 days prior. We will arrange for repeat CT scan of your chest in preparation for the procedure We will arrange for an MRI of the abdomen with contrast Continue your other medications as you have been taking them Follow with T. Parrett in 1 month in our office

## 2022-10-28 NOTE — Assessment & Plan Note (Signed)
We reviewed your CT scans of the chest and your PET scan today. We will arrange for a navigational bronchoscopy to be done under general anesthesia as an outpatient at [facility] endoscopy.  We will try to get this scheduled for 11/16/2022.  You will need a designated driver and someone to watch you at home on that day after the procedure.  You will need to stop your Eliquis 2 days prior. We will arrange for repeat CT scan of your chest in preparation for the procedure We will arrange for an MRI of the abdomen with contrast Continue your other medications as you have been taking them Follow with T. Parrett in 1 month in our office

## 2022-11-04 ENCOUNTER — Ambulatory Visit: Payer: Medicare Other

## 2022-11-06 ENCOUNTER — Ambulatory Visit (HOSPITAL_COMMUNITY): Admission: RE | Admit: 2022-11-06 | Payer: Medicare Other | Source: Ambulatory Visit

## 2022-11-06 ENCOUNTER — Ambulatory Visit (HOSPITAL_COMMUNITY): Payer: Medicare Other

## 2022-11-11 ENCOUNTER — Ambulatory Visit (HOSPITAL_COMMUNITY)
Admission: RE | Admit: 2022-11-11 | Discharge: 2022-11-11 | Discharge status: Home / Self Care | Payer: Medicare Other | Source: Ambulatory Visit | Attending: Emergency Medicine | Admitting: Emergency Medicine

## 2022-11-11 ENCOUNTER — Ambulatory Visit: Payer: Medicare Other | Attending: Family Medicine

## 2022-11-11 ENCOUNTER — Other Ambulatory Visit: Payer: Self-pay | Admitting: Emergency Medicine

## 2022-11-11 ENCOUNTER — Ambulatory Visit (HOSPITAL_COMMUNITY)
Admission: RE | Admit: 2022-11-11 | Discharge: 2022-11-11 | Disposition: A | Discharge status: Home / Self Care | Payer: Medicare Other | Source: Ambulatory Visit | Attending: Emergency Medicine | Admitting: Emergency Medicine

## 2022-11-11 DIAGNOSIS — M6281 Muscle weakness (generalized): Secondary | ICD-10-CM | POA: Diagnosis present

## 2022-11-11 DIAGNOSIS — M25511 Pain in right shoulder: Secondary | ICD-10-CM | POA: Diagnosis present

## 2022-11-11 DIAGNOSIS — M25512 Pain in left shoulder: Secondary | ICD-10-CM | POA: Diagnosis present

## 2022-11-11 DIAGNOSIS — K869 Disease of pancreas, unspecified: Secondary | ICD-10-CM

## 2022-11-11 DIAGNOSIS — R911 Solitary pulmonary nodule: Secondary | ICD-10-CM | POA: Insufficient documentation

## 2022-11-11 DIAGNOSIS — G8929 Other chronic pain: Secondary | ICD-10-CM | POA: Insufficient documentation

## 2022-11-11 MED ORDER — GADOBUTROL 1 MMOL/ML IV SOLN
5.0000 mL | Freq: Once | INTRAVENOUS | Status: AC | PRN
  Administered 2022-11-11: 5 mL via INTRAVENOUS

## 2022-11-11 NOTE — Therapy (Signed)
OUTPATIENT PHYSICAL THERAPY TREATMENT NOTE    Patient Name: Tina Watkins MRN: [id number] DOB:[birth year], 57 y.o., female Today's Date: 11/11/2022   END OF SESSION:  PT End of Session - 11/11/22 0914     Visit Number 10    Number of Visits 16    Date for PT Re-Evaluation 12/09/22    Authorization Type MCR / MCD    Progress Note Due on Visit 10    PT Start Time 0915    PT Stop Time 0953    PT Time Calculation (min) 38 min    Activity Tolerance Patient tolerated treatment well    Behavior During Therapy [facility] for tasks assessed/performed               Past Medical History:  Diagnosis Date   Bacteremia due to Gram-negative bacteria 05/23/2011   Blind    right eye   Blind right eye    CHF (congestive heart failure) (HCC)    Chronic lower back pain    Complication of anesthesia    "woke up during OR; I have an extremely high tolerance" (12/11/2011) 1 procedure was graft; the other procedures were procedures that are typically done with sedation.   DDD (degenerative disc disease), cervical    Depression    Dysrhythmia    "tachycardia" (12/11/2011) new onset afib 10/15/14 EKG   E coli bacteremia 06/18/2011   Elevated LDL cholesterol level 12/2018   ESRD (end stage renal disease) (HCC) 06/12/2011   Fibromyalgia    Gastroparesis    Gastroparesis    GERD (gastroesophageal reflux disease)    Glaucoma    right eye   Gout    H/O carpal tunnel syndrome    Headache 10/2019   Headache(784.0)    "not often anymore" (12/11/2011)   Herpes genitalia 1994   History of blood transfusion    "more than a few times" (12/11/2011)   History of stomach ulcers    Hypotension    Iron deficiency anemia    New onset a-fib (HCC)    10/15/14 EKG   Osteopenia    Peripheral neuropathy 11/2018   Pressure ulcer of sacral region, stage 1 07/2019   Seizures (HCC) 1994   "post transplant; only have had that one" (12/11/2011)   Spinal stenosis in cervical region    Stroke [facility])     left basal  ganglia lacunar infarct; Right frontal lobe lacunar infarct.   Stroke [facility]) ~ 1999; 2001   "briefly lost my vision; lost my right eye" (12/11/2011)   Vitamin D deficiency 12/2018   Past Surgical History:  Procedure Laterality Date   ANTERIOR CERVICAL DECOMP/DISCECTOMY FUSION N/A 01/08/2015   Procedure: Anterior Cervical Three-Four/Four-Five Decompression/Diskectomy/Fusion;  Surgeon: Hilda Lias, MD;  Location: [facility] NEURO ORS;  Service: Neurosurgery;  Laterality: N/A;  C3-4 C4-5 Anterior cervical decompression/diskectomy/fusion   APPENDECTOMY  ~ 2004   BIOPSY  09/12/2020   Procedure: BIOPSY;  Surgeon: Rachael Fee, MD;  Location: [facility] ENDOSCOPY;  Service: Endoscopy;;   CATARACT EXTRACTION     right eye   COLONOSCOPY     COLONOSCOPY WITH PROPOFOL N/A 09/12/2020   Procedure: COLONOSCOPY WITH PROPOFOL;  Surgeon: Rachael Fee, MD;  Location: [facility] ENDOSCOPY;  Service: Endoscopy;  Laterality: N/A;   ENUCLEATION  2001   "right"   ESOPHAGOGASTRODUODENOSCOPY (EGD) WITH PROPOFOL N/A 04/21/2012   Procedure: ESOPHAGOGASTRODUODENOSCOPY (EGD) WITH PROPOFOL;  Surgeon: Rachael Fee, MD;  Location: [facility] ENDOSCOPY;  Service: Endoscopy;  Laterality: N/A;   ESOPHAGOGASTRODUODENOSCOPY (EGD)  WITH PROPOFOL N/A 09/12/2020   Procedure: ESOPHAGOGASTRODUODENOSCOPY (EGD) WITH PROPOFOL;  Surgeon: Rachael Fee, MD;  Location: [facility] ENDOSCOPY;  Service: Endoscopy;  Laterality: N/A;   INSERTION OF DIALYSIS CATHETER  1988   "AV graft LUA & LFA; LUA worked for 1 day; LFA never worked"   IR FLUORO GUIDE CV LINE RIGHT  07/11/2019   IR FLUORO GUIDE CV LINE RIGHT  10/20/2019   IR FLUORO GUIDE CV LINE RIGHT  05/31/2020   IR PTA VENOUS EXCEPT DIALYSIS CIRCUIT  05/31/2020   IR RADIOLOGY PERIPHERAL GUIDED IV START  07/11/2019   IR US GUIDE VASC ACCESS RIGHT  07/11/2019   IR US GUIDE VASC ACCESS RIGHT  07/11/2019   IR US GUIDE VASC ACCESS RIGHT  07/11/2019   IR VENOCAVAGRAM IVC  05/31/2020   KIDNEY TRANSPLANT  1994; 1999; 2005   "right"    MULTIPLE TOOTH EXTRACTIONS     POLYPECTOMY  09/12/2020   Procedure: POLYPECTOMY;  Surgeon: Rachael Fee, MD;  Location: [facility] ENDOSCOPY;  Service: Endoscopy;;   RIGHT HEART CATH N/A 03/23/2019   Procedure: RIGHT HEART CATH;  Surgeon: Dolores Patty, MD;  Location: [facility] INVASIVE CV LAB;  Service: Cardiovascular;  Laterality: N/A;   TONSILLECTOMY     TOTAL NEPHRECTOMY  1988?; 1994; 2005   Patient Active Problem List   Diagnosis Date Noted   Pulmonary nodule 1 cm or greater in diameter 10/28/2022   COVID-19 10/02/2022   Encounter for monitoring amiodarone therapy 07/01/2022   Atrial fibrillation (HCC) 07/01/2022   Bilateral hip pain 01/14/2022   Prolonged Q-T interval on ECG 09/07/2021   Nausea & vomiting 09/06/2021   Bilateral shoulder pain 06/06/2021   Bilateral hand swelling 06/06/2021   Cervical radiculitis 01/24/2021   Acute respiratory failure with hypoxia (HCC) 10/23/2020   Coagulation defect, unspecified (HCC) 08/01/2020   Aortic atherosclerosis (HCC) 07/10/2020   Hypercoagulable state due to atrial fibrillation (HCC) 04/23/2020   Atrial flutter (HCC) 03/29/2020   Chronic pain of both knees 03/13/2020   Chills (without fever) 12/12/2019   Other pancytopenia (HCC)    Decubitus ulcer of sacral region, stage 1 08/18/2019   HCAP (healthcare-associated pneumonia) 08/05/2019   Pressure injury of skin 07/24/2019   Chest congestion 07/24/2019   Itching 07/24/2019   Weakness 06/13/2019   Pneumonia due to COVID-19 virus 05/09/2019   Hypotension 05/09/2019   Symptomatic anemia 05/09/2019   CHF (congestive heart failure) (HCC) 05/09/2019   Swollen abdomen 01/04/2019   DDD (degenerative disc disease), cervical 12/06/2018   Neuropathy 12/06/2018   Paresthesia 10/11/2018   Neck pain 10/11/2018   Tobacco abuse 11/20/2017   Right leg swelling 11/20/2017   Right leg pain 11/20/2017   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   GERD (gastroesophageal reflux disease) 11/20/2017   Acute  venous embolism and thrombosis of deep vessels of proximal lower extremity, right (HCC) 11/20/2017   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    Thrush, oral    AKI (acute kidney injury) (HCC)    Multifocal pneumonia 08/09/2017   ETD (Eustachian tube dysfunction), bilateral 08/03/2017   Acute recurrent pansinusitis 07/21/2017   Chest pain 09/29/2015   Cervical stenosis of spinal canal 01/08/2015   Urinary tract infectious disease    Muscle spasms of neck 06/15/2014   Bleeding hemorrhoid 06/15/2014   Anemia in chronic kidney disease 06/15/2014   Pyelonephritis, acute 06/13/2014   Sepsis (HCC) 06/13/2014   History of kidney transplant    Abnormal  CT scan 09/14/2012   Chronic pain syndrome 04/09/2012   Dehydration, mild 04/09/2012   Herpes infection 06/23/2011   Anxiety 06/23/2011   E coli bacteremia 06/18/2011   ESRD (end stage renal disease) (HCC) 06/12/2011   Lower urinary tract infectious disease 06/12/2011   Bacteremia due to Gram-negative bacteria 05/23/2011   History of renal transplantation 05/22/2011   Septic shock (HCC) 05/22/2011   Acute on chronic kidney failure (HCC) 05/22/2011   Gastroparesis 04/24/2008   WEIGHT LOSS 08/24/2007   NAUSEA AND VOMITING 08/24/2007    PCP: Ivonne Andrew, NP  REFERRING PROVIDER: Unknown Jim, MD  REFERRING DIAG: Bil adhesive capsulitis   THERAPY DIAG:  Chronic pain of both shoulders  Muscle weakness (generalized)  Chronic left shoulder pain  Chronic right shoulder pain  Rationale for Evaluation and Treatment: Rehabilitation  ONSET DATE: Chronic, ongoing for years   SUBJECTIVE:                                                                                                                                                                                     SUBJECTIVE STATEMENT: Patient reports that her shoulder pain is at a 0/10 today, she recently had injections in both  shoulders which is helping a lot.   PAIN:  Are you having pain? Yes:  NPRS scale: 10/10 Pain location: Bilateral shoulders Pain description: constant aching Aggravating factors: Cooking, washing dishes, grooming, reaching into cabinet Relieving factors: Rest  PERTINENT HISTORY: History of stroke, DVT, CHF, Afib, Neuropathy, neck pain with ACDF C3-5 in 2016, gout, DDD, ESRD, cervical stenosis, dialysis, kidney transplant (failed), see complex medical history above  PRECAUTIONS: See pertinent medical history  WEIGHT BEARING RESTRICTIONS: No  PATIENT GOALS: Improve shoulder pain and ability to reach overhead, put hair in ponytail   OBJECTIVE:  PATIENT SURVEYS:  FOTO 56 % functional status  09/09/2022: 53% 10/14/2022: 55%  POSTURE: Rounded shoulder and forward head posture  UPPER EXTREMITY ROM:   ROM Right eval Left eval Rt / Lt 08/12/2022 Rt / Lt 08/17/2022 R/L 8/7 Rt / Lt 10/14/2022  Shoulder flexion 90 100 105 / 100 95 / 100 90/115 105 / 118  Shoulder extension        Shoulder abduction 65 90      Shoulder adduction        Shoulder internal rotation Reach to lateral iliac crest Reach to PSIS      Shoulder external rotation Unable to reach to head Reach to occiput    Reach to occiput but with significant pain  Elbow flexion        Elbow extension        Wrist flexion  Wrist extension        Wrist ulnar deviation        Wrist radial deviation        Wrist pronation        Wrist supination        (Blank rows = not tested)  UPPER EXTREMITY MMT:    Strength assessment within available range  MMT Right eval Left eval  Shoulder flexion 3 3  Shoulder extension    Shoulder abduction 3 3  Shoulder adduction    Shoulder internal rotation    Shoulder external rotation    Middle trapezius    Lower trapezius    Elbow flexion    Elbow extension    Wrist flexion    Wrist extension    Wrist ulnar deviation    Wrist radial deviation    Wrist pronation    Wrist  supination    Grip strength (lbs)    (Blank rows = not tested)  JOINT MOBILITY TESTING:  Not assessed  PALPATION:  Generalized tenderness of bilateral shoulders    TODAY'S TREATMENT:   [facility] Adult PT Treatment:                                                DATE: 11/11/22 Therapeutic Exercise: Nustep L3 x 6 min with UE/LE while taking subjective, working on shoulder motion Rows RTB 2x10 Shoulder extension RTB 2x10 Seated overhead pulley for shoulder flexion/scaption x 3 min Reclined table: Dowel AAROM ER/IR x10 BIL ER/IR x10 each BIL clinician resistance YTB Double ER with yellow 2x10 Shoulder flexion with dowel small range 2x10   [facility] Adult PT Treatment:                                                DATE: 10/27/22 Therapeutic Exercise: Nustep L3 x 6 min with UE/LE while taking subjective, working on shoulder motion Seated overhead pulley for shoulder flexion x 3 min Reclined table: Dowel AAROM ER/IR ER/IR x10 each BIL clinician resistance YTB Double ER with yellow 2x10 Shoulder flexion with dowel very small range x10 Modalities: MHP to BIL shoulders, pt reclined on table x10 mins post session   [facility] Adult PT Treatment:                                                DATE: 10/14/22 Therapeutic Exercise: Nustep L4 x 3 min, L2 x 2 min with UE/LE while taking subjective, working on shoulder motion Seated overhead pulley for shoulder flexion x 3 min Standing shoulder flexion stretch with physioball on low table 10 x 5 sec Row with red 2 x 10 Extension with yellow 2 x 10 Standing finger ladder x 5 each with 10 sec hold       PATIENT EDUCATION: Education details: POC extension, HEP Person educated: Patient Education method: Explanation, Demonstration, Tactile cues, Verbal cues Education comprehension: verbalized understanding, returned demonstration, verbal cues required, tactile cues required, and needs further education  HOME EXERCISE PROGRAM: Access Code: [email or web address]     ASSESSMENT: CLINICAL IMPRESSION: Patient presents to PT reporting that she had injections  in BIL shoulders recently and that her pain is at a 0/10 today. Session today continued to focus on periscapular and RTC strengthening and shoulder ROM with increased repetitions and resistance today to good effect. Patient was able to tolerate all prescribed exercises with no adverse effects. Patient continues to benefit from skilled PT services and should be progressed as able to improve functional independence.    OBJECTIVE IMPAIRMENTS: decreased activity tolerance, decreased ROM, decreased strength, postural dysfunction, and pain.   ACTIVITY LIMITATIONS: carrying, lifting, bathing, dressing, reach over head, and hygiene/grooming  PARTICIPATION LIMITATIONS: meal prep, cleaning, laundry, shopping, and community activity  PERSONAL FACTORS: Fitness, Past/current experiences, Social background, Time since onset of injury/illness/exacerbation, and 3+ comorbidities: see pertinent medical history above  are also affecting patient's functional outcome.    GOALS: Goals reviewed with patient? Yes  SHORT TERM GOALS: Target date: 08/03/2022  Patient will be I with initial HEP in order to progress with therapy. Baseline: HEP provided at eval 08/12/2022: progressing 10/14/2022: independent Goal status: MET  2.  Patient will report bilateral shoulder pain </= 3/10 in order to reduce functional limitations Baseline: patient reports shoulder pain 6/10 08/12/2022: patient reports shoulder pain 3/10 Goal status: MET  LONG TERM GOALS: Target date: 12/09/2022  Patient will be I with final HEP to maintain progress from PT. Baseline: HEP provided at eval 8/7: independent 10/14/2022: progressing Goal status: ONGOING  2.  Patient will report >/= 63% status on FOTO to indicate improved functional ability. Baseline: 56% functional status 8/7: 53 10/14/2022: 55% Goal status: ONGOING  3.  Patient will  demonstrate bilateral shoulder AROM flexion >/= 110 deg in order to improve reach into low shelf Baseline: shoulder AROM flexion 90 deg 8/7: R: 90, L 115 10/14/2022: Right 105, Left 118 Goal status: ONGOING  4.  Patient will be able to reach to occiput with right arm to demonstrate improved functional ER and ability to put her hair in a ponytail Baseline: patient unable to perform due to pain and limited motion 8/7: Top of head with significant pain 10/14/2022: able to reach occiput but with significant pain Goal status: PARTIALLY MET   PLAN: PT FREQUENCY: 1x/week  PT DURATION: 8 weeks  PLANNED INTERVENTIONS: Therapeutic exercises, Therapeutic activity, Neuromuscular re-education, Balance training, Gait training, Patient/Family education, Self Care, Joint mobilization, Aquatic Therapy, Dry Needling, Cryotherapy, Moist heat, Manual therapy, and Re-evaluation  PLAN FOR NEXT SESSION: Review HEP and progress PRN, manual/stretching for bilateral shoulders as tolerated, progressive strengthening and ROM exercises   Berta Minor PTA 11/11/22  9:53 AM Phone: [phone] Fax: [phone]

## 2022-11-12 ENCOUNTER — Encounter (HOSPITAL_COMMUNITY): Payer: Self-pay | Admitting: Emergency Medicine

## 2022-11-12 ENCOUNTER — Other Ambulatory Visit: Payer: Self-pay

## 2022-11-12 NOTE — Progress Notes (Signed)
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  Lung cancer screening CT 09/28/2022 shows slightly enlarged 1.1 cm right paratracheal node that is stable from prior, no hilar adenopathy.  She was noted to have diffuse bilateral bronchial wall thickening.  There is also a new solid left upper lobe pulmonary nodule 2.1 cm and several new clustered solid pulmonary nodules in the left upper lobe 2 to 5 mm.  PET scan 10/19/2022 showed moderate hypermetabolism associated with the left upper lobe nodule.  Per Dr. Kavin Leech note 10/28/2022, PFTs done 07/02/2021 showed "possible mild restriction on spirometry with a normal FEV1. Her lung volumes show possible hyperinflation based on elevated RV. The diffusion capacity is decreased but corrects to the normal range when adjusted for alveolar volume."  Echo 03/29/2020 showed EF 65 to 70%, normal RV function, no significant valvular abnormalities.  Stress 12/15/2021 was low risk, nonischemic.  Patient will need day of surgery labs and evaluation.  EKG 08/19/22: Sinus rhythm with 1st degree A-V block.  Rate 71.  Nuclear stress 12/15/2021:   ECG rhythm shows normal sinus rhythm. Resting ECG shows no ST-segment deviation. There is a myocardial infarction located in the inferolateral region.   No ST deviation was noted. There were no arrhythmias during stress. There were no arrhythmias during recovery. ECG was interpretable and without significant changes. The ECG was not diagnostic due to pharmacologic protocol.   LV perfusion is normal. There is no evidence of ischemia. There is no evidence of infarction.   Left ventricular function is normal. Nuclear stress EF: 75 %. The left ventricular ejection fraction is hyperdynamic (>65%). End diastolic cavity size is normal. End systolic cavity size is normal. No evidence  of transient ischemic dilation (TID) noted.   The study is normal. The study is low risk.   Prior study available for comparison from 04/12/2020.  TTE 03/29/2020:  1. Left ventricular ejection fraction, by estimation, is 65 to 70%. The  left ventricle has normal function. The left ventricle has no regional  wall motion abnormalities. Left ventricular diastolic parameters were  normal.   2. Right ventricular systolic function is normal. The right ventricular  size is normal. There is normal pulmonary artery systolic pressure.   3. The mitral valve is normal in structure. No evidence of mitral valve  regurgitation. No evidence of mitral stenosis.   4. The aortic valve is normal in structure. Aortic valve regurgitation is  not visualized. No aortic stenosis is present.   5. The inferior vena cava is normal in size with greater than 50%  respiratory variability, suggesting right atrial pressure of 3 mmHg.     [facility] Stay [facility]/Anesthesiology Phone [phone] 11/12/2022 12:01 PM

## 2022-11-12 NOTE — Anesthesia Preprocedure Evaluation (Addendum)
Anesthesia Evaluation  Patient identified by MRN, date of birth, ID band Patient awake    Reviewed: Allergy & Precautions, NPO status , Patient's Chart, lab work & pertinent test results  Airway Mallampati: II  TM Distance: >3 FB Neck ROM: Full    Dental  (+) Dental Advisory Given   Pulmonary Current Smoker   breath sounds clear to auscultation       Cardiovascular + dysrhythmias  Rhythm:Regular     Neuro/Psych CVA    GI/Hepatic Neg liver ROS,GERD  ,,  Endo/Other    Renal/GU ESRF and DialysisRenal disease     Musculoskeletal  (+) Arthritis ,    Abdominal   Peds  Hematology  (+) Blood dyscrasia, anemia   Anesthesia Other Findings   Reproductive/Obstetrics                             Anesthesia Physical Anesthesia Plan  ASA: 4  Anesthesia Plan: General   Post-op Pain Management: Tylenol PO (pre-op)*   Induction: Intravenous  PONV Risk Score and Plan: 2 and Dexamethasone, Ondansetron and Treatment may vary due to age or medical condition  Airway Management Planned: Oral ETT  Additional Equipment:   Intra-op Plan:   Post-operative Plan: Extubation in OR  Informed Consent: I have reviewed the patients History and Physical, chart, labs and discussed the procedure including the risks, benefits and alternatives for the proposed anesthesia with the patient or authorized representative who has indicated his/her understanding and acceptance.     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  Lung cancer screening CT 09/28/2022 shows slightly enlarged 1.1 cm right paratracheal node that is stable from prior, no hilar adenopathy.  She was noted  to have diffuse bilateral bronchial wall thickening.  There is also a new solid left upper lobe pulmonary nodule 2.1 cm and several new clustered solid pulmonary nodules in the left upper lobe 2 to 5 mm.  PET scan 10/19/2022 showed moderate hypermetabolism associated with the left upper lobe nodule.  Per Dr. Kavin Leech note 10/28/2022, PFTs done 07/02/2021 showed "possible mild restriction on spirometry with a normal FEV1. Her lung volumes show possible hyperinflation based on elevated RV. The diffusion capacity is decreased but corrects to the normal range when adjusted for alveolar volume."  Echo 03/29/2020 showed EF 65 to 70%, normal RV function, no significant valvular abnormalities.  Stress 12/15/2021 was low risk, nonischemic.  Patient will need day of surgery labs and evaluation.  EKG 08/19/22: Sinus rhythm with 1st degree A-V block.  Rate 71.  Nuclear stress 12/15/2021:   ECG rhythm shows normal sinus rhythm. Resting ECG shows no ST-segment deviation. There is a myocardial infarction located in the inferolateral region.   No ST deviation was noted. There were no arrhythmias during stress. There were no arrhythmias during recovery. ECG was interpretable and without significant changes. The ECG was not diagnostic due to pharmacologic protocol.   LV perfusion is normal. There is no evidence of ischemia. There is no evidence of infarction.   Left ventricular function is normal. Nuclear stress EF: 75 %. The left ventricular ejection fraction is hyperdynamic (>65%). End diastolic cavity size is normal. End systolic cavity size is normal. No evidence of transient ischemic dilation (TID) noted.  The study is normal. The study is low risk.   Prior study available for comparison from 04/12/2020.  TTE 03/29/2020: 1. Left ventricular ejection fraction, by estimation, is 65 to 70%. The  left ventricle has normal function. The left ventricle has no regional  wall motion abnormalities. Left ventricular  diastolic parameters were  normal.  2. Right ventricular systolic function is normal. The right ventricular  size is normal. There is normal pulmonary artery systolic pressure.  3. The mitral valve is normal in structure. No evidence of mitral valve  regurgitation. No evidence of mitral stenosis.  4. The aortic valve is normal in structure. Aortic valve regurgitation is  not visualized. No aortic stenosis is present.  5. The inferior vena cava is normal in size with greater than 50%  respiratory variability, suggesting right atrial pressure of 3 mmHg.     )        Anesthesia Quick Evaluation

## 2022-11-12 NOTE — Progress Notes (Addendum)
PCP - Angus Seller, NP Cardiologist - Dr Gala Romney Medical Clearance placed in chart.  CT Super D Chest x-ray - 11/11/22 EKG - 08/19/22 Stress Test - 12/15/21 ECHO - 03/29/20 Cardiac Cath - 03/23/19  ICD Pacemaker/Loop - n/a  Sleep Study -  n/a CPAP - none  Diabetes - n/a  Blood Thinner Instructions:  Follow your surgeon's instructions on when to stop Elquis prior to surgery.  Last dose was on 11/12/22 per patient.  NPO  Anesthesia review: Yes  STOP now taking any Aspirin (unless otherwise instructed by your surgeon), Aleve, Naproxen, Ibuprofen, Motrin, Advil, Goody's, BC's, all herbal medications, fish oil, and all vitamins.   Coronavirus Screening Do you have any of the following symptoms:  Cough yes/no: No Fever (>100.80F)  yes/no: No Runny nose yes/no: No Sore throat yes/no: No Difficulty breathing/shortness of breath  yes/no: No  Have you traveled in the last 14 days and where? yes/no: No  Patient verbalized understanding of instructions that were given via phone.

## 2022-11-16 ENCOUNTER — Ambulatory Visit (HOSPITAL_COMMUNITY)
Admission: RE | Admit: 2022-11-16 | Discharge: 2022-11-16 | Disposition: A | Discharge status: Home / Self Care | Payer: Medicare Other | Attending: Emergency Medicine | Admitting: Emergency Medicine

## 2022-11-16 ENCOUNTER — Ambulatory Visit (HOSPITAL_COMMUNITY): Payer: Medicare Other

## 2022-11-16 ENCOUNTER — Ambulatory Visit (HOSPITAL_BASED_OUTPATIENT_CLINIC_OR_DEPARTMENT_OTHER): Payer: Medicare Other | Admitting: Physician Assistant

## 2022-11-16 ENCOUNTER — Encounter (HOSPITAL_COMMUNITY): Payer: Self-pay | Admitting: Emergency Medicine

## 2022-11-16 ENCOUNTER — Other Ambulatory Visit: Payer: Self-pay

## 2022-11-16 ENCOUNTER — Encounter (HOSPITAL_COMMUNITY)
Admission: RE | Disposition: A | Discharge status: Home / Self Care | Payer: Self-pay | Source: Home / Self Care | Attending: Emergency Medicine

## 2022-11-16 ENCOUNTER — Ambulatory Visit (HOSPITAL_COMMUNITY): Payer: Medicare Other | Admitting: Physician Assistant

## 2022-11-16 DIAGNOSIS — Z992 Dependence on renal dialysis: Secondary | ICD-10-CM | POA: Diagnosis not present

## 2022-11-16 DIAGNOSIS — Z79899 Other long term (current) drug therapy: Secondary | ICD-10-CM | POA: Insufficient documentation

## 2022-11-16 DIAGNOSIS — Z8673 Personal history of transient ischemic attack (TIA), and cerebral infarction without residual deficits: Secondary | ICD-10-CM | POA: Diagnosis not present

## 2022-11-16 DIAGNOSIS — R918 Other nonspecific abnormal finding of lung field: Secondary | ICD-10-CM | POA: Diagnosis present

## 2022-11-16 DIAGNOSIS — J449 Chronic obstructive pulmonary disease, unspecified: Secondary | ICD-10-CM | POA: Insufficient documentation

## 2022-11-16 DIAGNOSIS — K219 Gastro-esophageal reflux disease without esophagitis: Secondary | ICD-10-CM | POA: Insufficient documentation

## 2022-11-16 DIAGNOSIS — Z9889 Other specified postprocedural states: Secondary | ICD-10-CM | POA: Insufficient documentation

## 2022-11-16 DIAGNOSIS — R911 Solitary pulmonary nodule: Secondary | ICD-10-CM

## 2022-11-16 DIAGNOSIS — N186 End stage renal disease: Secondary | ICD-10-CM | POA: Diagnosis not present

## 2022-11-16 DIAGNOSIS — I4891 Unspecified atrial fibrillation: Secondary | ICD-10-CM | POA: Diagnosis not present

## 2022-11-16 DIAGNOSIS — F17210 Nicotine dependence, cigarettes, uncomplicated: Secondary | ICD-10-CM | POA: Diagnosis not present

## 2022-11-16 DIAGNOSIS — I132 Hypertensive heart and chronic kidney disease with heart failure and with stage 5 chronic kidney disease, or end stage renal disease: Secondary | ICD-10-CM | POA: Insufficient documentation

## 2022-11-16 DIAGNOSIS — Z7901 Long term (current) use of anticoagulants: Secondary | ICD-10-CM | POA: Insufficient documentation

## 2022-11-16 DIAGNOSIS — I5022 Chronic systolic (congestive) heart failure: Secondary | ICD-10-CM | POA: Diagnosis not present

## 2022-11-16 HISTORY — PX: HEMOSTASIS CONTROL: SHX6838

## 2022-11-16 HISTORY — PX: BRONCHIAL BIOPSY: SHX5109

## 2022-11-16 HISTORY — PX: VIDEO BRONCHOSCOPY WITH RADIAL ENDOBRONCHIAL ULTRASOUND: SHX6849

## 2022-11-16 HISTORY — PX: BRONCHIAL BRUSHINGS: SHX5108

## 2022-11-16 HISTORY — DX: Pneumonia, unspecified organism: J18.9

## 2022-11-16 HISTORY — PX: FIDUCIAL MARKER PLACEMENT: SHX6858

## 2022-11-16 HISTORY — DX: Anxiety disorder, unspecified: F41.9

## 2022-11-16 HISTORY — PX: BRONCHIAL NEEDLE ASPIRATION BIOPSY: SHX5106

## 2022-11-16 LAB — POCT I-STAT, CHEM 8
BUN: 59 mg/dL — ABNORMAL HIGH (ref 6–20)
Calcium, Ion: 1.12 mmol/L — ABNORMAL LOW (ref 1.15–1.40)
Chloride: 94 mmol/L — ABNORMAL LOW (ref 98–111)
Creatinine, Ser: 10.7 mg/dL — ABNORMAL HIGH (ref 0.44–1.00)
Glucose, Bld: 85 mg/dL (ref 70–99)
HCT: 24 % — ABNORMAL LOW (ref 36.0–46.0)
Hemoglobin: 8.2 g/dL — ABNORMAL LOW (ref 12.0–15.0)
Potassium: 5.3 mmol/L — ABNORMAL HIGH (ref 3.5–5.1)
Sodium: 130 mmol/L — ABNORMAL LOW (ref 135–145)
TCO2: 24 mmol/L (ref 22–32)

## 2022-11-16 SURGERY — BRONCHOSCOPY, WITH BIOPSY USING ELECTROMAGNETIC NAVIGATION
Anesthesia: General

## 2022-11-16 MED ORDER — EPINEPHRINE 1 MG/10ML IJ SOSY
PREFILLED_SYRINGE | INTRAMUSCULAR | Status: DC | PRN
Start: 2022-11-16 — End: 2022-11-16
  Administered 2022-11-16: .4 mg via ENDOTRACHEOPULMONARY

## 2022-11-16 MED ORDER — ONDANSETRON HCL 4 MG/2ML IJ SOLN
INTRAMUSCULAR | Status: DC | PRN
  Administered 2022-11-16: 4 mg via INTRAVENOUS

## 2022-11-16 MED ORDER — APIXABAN 2.5 MG PO TABS: 2.5000 mg | ORAL_TABLET | Freq: Two times a day (BID) | ORAL | Status: DC

## 2022-11-16 MED ORDER — FENTANYL CITRATE (PF) 100 MCG/2ML IJ SOLN
INTRAMUSCULAR | Status: DC | PRN
Start: 2022-11-16 — End: 2022-11-16
  Administered 2022-11-16 (×2): 50 ug via INTRAVENOUS

## 2022-11-16 MED ORDER — ROCURONIUM BROMIDE 10 MG/ML (PF) SYRINGE
PREFILLED_SYRINGE | INTRAVENOUS | Status: DC | PRN
  Administered 2022-11-16 (×2): 30 mg via INTRAVENOUS

## 2022-11-16 MED ORDER — PROPOFOL 500 MG/50ML IV EMUL
INTRAVENOUS | Status: DC | PRN
Start: 2022-11-16 — End: 2022-11-16
  Administered 2022-11-16: 100 ug/kg/min via INTRAVENOUS

## 2022-11-16 MED ORDER — MIDAZOLAM HCL 2 MG/2ML IJ SOLN
INTRAMUSCULAR | Status: DC | PRN
  Administered 2022-11-16 (×2): 1 mg via INTRAVENOUS

## 2022-11-16 MED ORDER — PHENYLEPHRINE HCL-NACL 20-0.9 MG/250ML-% IV SOLN
INTRAVENOUS | Status: DC | PRN
  Administered 2022-11-16: 15 ug/min via INTRAVENOUS

## 2022-11-16 MED ORDER — SUGAMMADEX SODIUM 200 MG/2ML IV SOLN
INTRAVENOUS | Status: DC | PRN
  Administered 2022-11-16: 150 mg via INTRAVENOUS

## 2022-11-16 MED ORDER — CHLORHEXIDINE GLUCONATE 0.12 % MT SOLN
OROMUCOSAL | Status: AC
  Administered 2022-11-16: 15 mL
  Filled 2022-11-16: qty 15

## 2022-11-16 MED ORDER — PHENYLEPHRINE 80 MCG/ML (10ML) SYRINGE FOR IV PUSH (FOR BLOOD PRESSURE SUPPORT)
PREFILLED_SYRINGE | INTRAVENOUS | Status: DC | PRN
  Administered 2022-11-16: 80 ug via INTRAVENOUS

## 2022-11-16 MED ORDER — ACETAMINOPHEN 500 MG PO TABS: 1000.0000 mg | ORAL_TABLET | Freq: Once | ORAL | Status: AC

## 2022-11-16 MED ORDER — PROPOFOL 10 MG/ML IV BOLUS
INTRAVENOUS | Status: DC | PRN
  Administered 2022-11-16: 80 mg via INTRAVENOUS
  Administered 2022-11-16: 30 mg via INTRAVENOUS
  Administered 2022-11-16: 20 mg via INTRAVENOUS

## 2022-11-16 MED ORDER — DEXAMETHASONE SODIUM PHOSPHATE 10 MG/ML IJ SOLN
INTRAMUSCULAR | Status: DC | PRN
  Administered 2022-11-16: 10 mg via INTRAVENOUS

## 2022-11-16 MED ORDER — ACETAMINOPHEN 500 MG PO TABS
ORAL_TABLET | ORAL | Status: AC
  Administered 2022-11-16: 1000 mg via ORAL
  Filled 2022-11-16: qty 2

## 2022-11-16 MED ORDER — HEPARIN SODIUM (PORCINE) 1000 UNIT/ML IJ SOLN
1000.0000 [IU] | Freq: Once | INTRAMUSCULAR | Status: AC
  Administered 2022-11-16: 1000 [IU] via INTRAVENOUS

## 2022-11-16 MED ORDER — LIDOCAINE 2% (20 MG/ML) 5 ML SYRINGE
INTRAMUSCULAR | Status: DC | PRN
  Administered 2022-11-16: 40 mg via INTRAVENOUS

## 2022-11-16 MED ORDER — FENTANYL CITRATE (PF) 100 MCG/2ML IJ SOLN: 25.0000 ug | INTRAMUSCULAR | Status: DC | PRN

## 2022-11-16 MED ORDER — SODIUM CHLORIDE 0.9 % IV SOLN: INTRAVENOUS | Status: DC

## 2022-11-16 MED ORDER — HEPARIN SODIUM (PORCINE) 1000 UNIT/ML IJ SOLN
INTRAMUSCULAR | Status: AC
  Administered 2022-11-16: 1000 [IU]
  Filled 2022-11-16: qty 1

## 2022-11-16 SURGICAL SUPPLY — 1 items: superlock fiducial IMPLANT

## 2022-11-16 NOTE — Discharge Instructions (Addendum)
Flexible Bronchoscopy, Care After This sheet gives you information about how to care for yourself after your test. Your doctor may also give you more specific instructions. If you have problems or questions, contact your doctor. Follow these instructions at home: Eating and drinking When your numbness is gone and your cough and gag reflexes have come back, you may: Eat only soft foods. Slowly drink liquids. The day after the test, go back to your normal diet. Driving Do not drive for 24 hours if you were given a medicine to help you relax (sedative). Do not drive or use heavy machinery while taking prescription pain medicine. General instructions  Take over-the-counter and prescription medicines only as told by your doctor. Return to your normal activities as told. Ask what activities are safe for you. Do not use any products that have nicotine or tobacco in them. This includes cigarettes and e-cigarettes. If you need help quitting, ask your doctor. Keep all follow-up visits as told by your doctor. This is important. It is very important if you had a tissue sample (biopsy) taken. Get help right away if: You have shortness of breath that gets worse. You get light-headed. You feel like you are going to pass out (faint). You have chest pain. You cough up: More than a little blood. More blood than before. Summary Do not eat or drink anything (not even water) for 2 hours after your test, or until your numbing medicine wears off. Do not use cigarettes. Do not use e-cigarettes. Get help right away if you have chest pain.  Please call our office for any questions or concerns.  [phone].  Okay to resume the Eliquis on 11/18/2022 (Wednesday).  This information is not intended to replace advice given to you by your health care provider. Make sure you discuss any questions you have with your health care provider. Document Released: 11/16/2008 Document Revised: 01/01/2017 Document Reviewed:  02/07/2016 Elsevier Patient Education  2020 [employer].

## 2022-11-16 NOTE — Interval H&P Note (Signed)
History and Physical Interval Note:  11/16/2022 7:19 AM  Tina Watkins  has presented today for surgery, with the diagnosis of LEFT UPPER LOPE NODULE.  The various methods of treatment have been discussed with the patient and family. After consideration of risks, benefits and other options for treatment, the patient has consented to  Procedure(s): ROBOTIC ASSISTED NAVIGATIONAL BRONCHOSCOPY (N/A) as a surgical intervention.  The patient's history has been reviewed, patient examined, no change in status, stable for surgery.  I have reviewed the patient's chart and labs.  Questions were answered to the patient's satisfaction.     Leslye Peer

## 2022-11-16 NOTE — Transfer of Care (Signed)
Immediate Anesthesia Transfer of Care Note  Patient: Tina Watkins  Procedure(s) Performed: ROBOTIC ASSISTED NAVIGATIONAL BRONCHOSCOPY BRONCHIAL BRUSHINGS HEMOSTASIS CONTROL VIDEO BRONCHOSCOPY WITH RADIAL ENDOBRONCHIAL ULTRASOUND BRONCHIAL NEEDLE ASPIRATION BIOPSIES BRONCHIAL BIOPSIES FIDUCIAL MARKER PLACEMENT  Patient Location: PACU  Anesthesia Type:General  Level of Consciousness: awake, alert , and oriented  Airway & Oxygen Therapy: Patient Spontanous Breathing and Patient connected to face mask oxygen  Post-op Assessment: Report given to RN, Post -op Vital signs reviewed and stable, and Patient moving all extremities X 4  Post vital signs: Reviewed and stable  Last Vitals:  Vitals Value Taken Time  BP 115/68   Temp    Pulse 67 11/16/22 0853  Resp 18 11/16/22 0853  SpO2 100 % 11/16/22 0853  Vitals shown include unfiled device data.  Last Pain:  Vitals:   11/16/22 0608  TempSrc: Oral  PainSc: 0-No pain         Complications: No notable events documented.

## 2022-11-16 NOTE — Anesthesia Postprocedure Evaluation (Signed)
Anesthesia Post Note  Patient: Tina Watkins  Procedure(s) Performed: ROBOTIC ASSISTED NAVIGATIONAL BRONCHOSCOPY BRONCHIAL BRUSHINGS HEMOSTASIS CONTROL VIDEO BRONCHOSCOPY WITH RADIAL ENDOBRONCHIAL ULTRASOUND BRONCHIAL NEEDLE ASPIRATION BIOPSIES BRONCHIAL BIOPSIES FIDUCIAL MARKER PLACEMENT     Patient location during evaluation: PACU Anesthesia Type: General Level of consciousness: awake and alert Pain management: pain level controlled Vital Signs Assessment: post-procedure vital signs reviewed and stable Respiratory status: spontaneous breathing, nonlabored ventilation, respiratory function stable and patient connected to nasal cannula oxygen Cardiovascular status: blood pressure returned to baseline and stable Postop Assessment: no apparent nausea or vomiting Anesthetic complications: no  No notable events documented.  Last Vitals:  Vitals:   11/16/22 0920 11/16/22 0930  BP:  (!) 92/56  Pulse: 62 62  Resp: (!) 22 (!) 22  Temp:  36.7 C  SpO2: 93% 92%    Last Pain:  Vitals:   11/16/22 0920  TempSrc:   PainSc: 0-No pain                 Kennieth Rad

## 2022-11-16 NOTE — Progress Notes (Signed)
Notified Dr. Glade Stanford that pt's K+ is 5.3 today. No orders at this time.

## 2022-11-16 NOTE — Anesthesia Procedure Notes (Signed)
Procedure Name: Intubation Date/Time: 11/16/2022 7:39 AM  Performed by: Nils Pyle, CRNAPre-anesthesia Checklist: Patient identified, Emergency Drugs available, Suction available and Patient being monitored Patient Re-evaluated:Patient Re-evaluated prior to induction Oxygen Delivery Method: Circle System Utilized Preoxygenation: Pre-oxygenation with 100% oxygen Induction Type: IV induction Ventilation: Mask ventilation without difficulty and Oral airway inserted - appropriate to patient size Laryngoscope Size: [name] and 2 Grade View: Grade I Tube type: Oral Tube size: 8.5 mm Number of attempts: 1 Airway Equipment and Method: Stylet and Oral airway Placement Confirmation: ETT inserted through vocal cords under direct vision, positive ETCO2 and breath sounds checked- equal and bilateral Secured at: 21 cm Tube secured with: Tape Dental Injury: Teeth and Oropharynx as per pre-operative assessment

## 2022-11-16 NOTE — Op Note (Signed)
Video Bronchoscopy with Robotic Assisted Bronchoscopic Navigation   Date of Operation: 11/16/2022   Pre-op Diagnosis: Left upper lobe nodule  Post-op Diagnosis: Same  Surgeon: Levy Pupa  Assistants: None  Anesthesia: General endotracheal anesthesia  Operation: Flexible video fiberoptic bronchoscopy with robotic assistance and biopsies.  Estimated Blood Loss: Minimal  Complications: None  Indications and History: Tina Watkins is a 57 y.o. female with history of end-stage renal disease, Atrial fibrillation on Eliquis, COPD, systolic CHF.  She participates in lung cancer screening program has an enlarging left upper lobe pulmonary nodule that is hypermetabolic on PET.  Recommendation made to achieve tissue diagnosis via robotic assisted navigational bronchoscopy. The risks, benefits, complications, treatment options and expected outcomes were discussed with the patient.  The possibilities of pneumothorax, pneumonia, reaction to medication, pulmonary aspiration, perforation of a viscus, bleeding, failure to diagnose a condition and creating a complication requiring transfusion or operation were discussed with the patient who freely signed the consent.    Description of Procedure: The patient was seen in the Preoperative Area, was examined and was deemed appropriate to proceed.  The patient was taken to [facility] endoscopy room 3, identified as [name] and the procedure verified as Flexible Video Fiberoptic Bronchoscopy.  A Time Out was held and the above information confirmed.   Prior to the date of the procedure a high-resolution CT scan of the chest was performed. Utilizing ION software program a virtual tracheobronchial tree was generated to allow the creation of distinct navigation pathways to the patient's parenchymal abnormalities. After being taken to the operating room general anesthesia was initiated and the patient  was orally intubated. The video fiberoptic bronchoscope  was introduced via the endotracheal tube and a general inspection was performed which showed normal right lung anatomy.  There was an irregular, raised area of mucosa in the left upper lobe airway at the orifice.  Endobronchial brushings were performed.  1: 10,000 dilution epinephrine was injected onto this lesion to achieve hemostasis and facilitate the rest of the navigation, 4 cc total.  Aspiration of the bilateral mainstems was completed to remove any remaining secretions and blood. Robotic catheter inserted into patient's endotracheal tube.   Target #1 left upper lobe pulmonary nodule: The distinct navigation pathways prepared prior to this procedure were then utilized to navigate to patient's lesion identified on CT scan. The robotic catheter was secured into place and the vision probe was withdrawn.  Lesion location was approximated using fluoroscopy and radial endobronchial ultrasound for peripheral targeting.  Local registration and targeting was performed using Cios three-dimensional imaging.  Under fluoroscopic guidance transbronchial needle brushings, transbronchial needle biopsies, and transbronchial forceps biopsies were performed to be sent for cytology and pathology.  Under fluoroscopic guidance a single fiducial marker was placed adjacent to the nodule.  At the end of the procedure a general airway inspection was performed and there was no evidence of active bleeding. The bronchoscope was removed.  The patient tolerated the procedure well. There was no significant blood loss and there were no obvious complications. A post-procedural chest x-ray is pending.  Samples Target #1: 1. Transbronchial needle brushings from left upper lobe nodule 2. Transbronchial Wang needle biopsies from left upper lobe nodule 3. Transbronchial forceps biopsies from left upper lobe nodule  Endobronchial sample: 1. Endobronchial biopsies from left upper lobe airway (orifice)    Plans:  The patient will be  discharged from the PACU to home when recovered from anesthesia and after chest x-ray is reviewed. We  will review the cytology, pathology results with the patient when they become available. Outpatient followup will be with Dr. Delton Coombes.    Levy Pupa, MD, PhD 11/16/2022, 8:44 AM [facility] [phone] or if no answer before 7:00PM call [phone] For any issues after 7:00PM please call eLink [phone]

## 2022-11-16 NOTE — Research (Signed)
Title: A multi-center, prospective, single-arm, observational study to evaluate real-world outcomes for the shape-sensing Ion endoluminal system  Primary Outcome: Evaluate procedure characteristics and short and long-term patient outcomes following shape-sensing robotic-assisted bronchoscopy (ssRAB) utilizing the Ion Endoluminal System for lung lesion localization or biopsy.   Protocol # / Study Name: ISI-ION-003 Clinical Trials #: [id number] Sponsor: [employer] Principal Investigator: Dr. Elige Radon Icard   Key Features of Ion Endoluminal System (referred to as "Ion") Ion is the first FDA cleared bronchoscopy system that uses fiber optic shape sensing technology to inform on location within the airways. Its catheter/tool channel has a smaller outer diameter (3.5 mm) in comparison to conventional bronchoscopes, allowing it to navigate into the smaller airways of the periphery.     Key Inclusion Criteria Subject is 18 years or older at the time of the procedure Subject is a candidate for a planned, elective RAB lung lesion localization or biopsy procedure in which the Ion Endoluminal System is planned to be utilized.  Subject  able to understand and adhere to study requirements and provide informed consent.   Key Exclusion Criteria Subject is under the care of a Museum/gallery exhibitions officer and is unable to provide informed consent on their own accord.  Subject is participating in an interventional research study or research study with investigational agents with an unknown safety profile that would interfere with participation or the results of this study.  Female subjects who are pregnant or nursing at the time of the index Ion procedure, as determined by standard site practices. Subjects that are incarcerated or institutionalized under court order, or other vulnerable populations.    Previous Clinical Trials Since receiving FDA clearance in Feb 2019, Ion has been adopted  commercially by over 226 centers in the [place], and utilized in over 40,000 procedures.  The first in-human study enrolled 19 subjects with a mean lesion size of 14.8 mm and the overall diagnostic yield was 79.3%, with no adverse events. 17 (58.6%) lesions were reported to have a bronchus sign available on CT imaging.  A multi-center study published results in 2022, with 270 lesions biopsied in 241 patients using Ion. The mean largest cardinal lesion size was 18.86.53mm, and the mean airway generation count was 7.01.6. Asymptomatic pneumothorax occurred in 3.3% of subjects, and 0.8% experienced airway bleeding.   Another study provided preliminary results in 2022, with 87% sensitivity for malignancy, a diagnostic yield of 81%, and a mean lesion size of 16 mm. 75% of biopsy cases were bronchus-sign negative. 4% of subjects experienced pneumothoraces (including those requiring intervention), and 0.8% of subjects experienced airway bleeding requiring wedging or balloon tamponade.  A single-center study captured 131 consecutive procedures of pulmonary biopsy using Ion. The navigational success rate was 98.7%, with an overall diagnostic yield of 81.7%, an overall complication rate of 3%, and a pneumothorax rate of 1.5%.    [facility] [place] Clinical Research Coordinator note:   This visit for Subject Tina Watkins with DOB: 04-Jan-1966 on 11/16/2022 for the above protocol is Visit/Encounter # Pre-procedure, Intra-procedure and Post-procedure, and is for purpose of research.   The consent for this encounter is under:  Protocol Version 1.0 Investigator Brochure Version N/A Consent Version Revision A, dated 14Nov2023 and is currently IRB approved.   [name] expressed continued interest and consent in continuing as a study subject. Subject confirmed that there was no change in contact information (e.g. address, telephone, email). Subject thanked for participation in research and  contribution to science.  In this visit 11/16/2022 the subject will be evaluated by Sub-Investigator named Dr. Delton Coombes. This research coordinator has verified that the above investigator is up to date with his/her training logs.   The Subject was informed that the PI continues to have oversight of the subject's visits and course through relevant discussions, reviews, and also specifically of this visit by routing of this note to the PI.  The research study was discussed with the subject in the pre-operative room. The study was explained in detail including all the contents of the informed consent document. The subject was encouraged to ask questions. All questions were answered to their satisfaction. The IRB approved informed consent was signed, and a copy was given to the subject. After obtaining consent, the subject underwent scheduled procedure using the ion endoluminal system. Data collection was completed per protocol. Refer to paper source subject binder for further details.      Signed by  Verdene Lennert Clinical Research Coordinator / Sub-Investigator  [facility]  [place], [place] 8:43 AM 11/16/2022

## 2022-11-17 ENCOUNTER — Encounter (HOSPITAL_COMMUNITY): Payer: Self-pay | Admitting: Emergency Medicine

## 2022-11-17 NOTE — Therapy (Signed)
OUTPATIENT PHYSICAL THERAPY TREATMENT NOTE    Patient Name: Tina Watkins MRN: [id number] DOB:[birth year], 57 y.o., female Today's Date: 11/18/2022   END OF SESSION:  PT End of Session - 11/18/22 0919     Visit Number 11    Number of Visits 16    Date for PT Re-Evaluation 12/09/22    Authorization Type MCR / MCD    Progress Note Due on Visit 10    PT Start Time 0919    PT Stop Time 0959    PT Time Calculation (min) 40 min    Activity Tolerance Patient tolerated treatment well    Behavior During Therapy [facility] for tasks assessed/performed              Past Medical History:  Diagnosis Date   Anxiety    Bacteremia due to Gram-negative bacteria 05/23/2011   Blind    right eye   Blind right eye    CHF (congestive heart failure) (HCC)    Chronic lower back pain    Complication of anesthesia    "woke up during OR; I have an extremely high tolerance" (12/11/2011) 1 procedure was graft; the other procedures were procedures that are typically done with sedation.   DDD (degenerative disc disease), cervical    Depression    Dysrhythmia    "tachycardia" (12/11/2011) new onset afib 10/15/14 EKG   E coli bacteremia 06/18/2011   Elevated LDL cholesterol level 12/2018   ESRD (end stage renal disease) (HCC) 06/12/2011   Tues-Thurs-Sat dialysis   Fibromyalgia    Gastroparesis    GERD (gastroesophageal reflux disease)    Glaucoma    right eye   Gout    H/O carpal tunnel syndrome    Headache 10/2019   no current problem per patient on 11/12/22   Headache(784.0)    "not often anymore" (12/11/2011)   Herpes genitalia 1994   History of blood transfusion    "more than a few times" (12/11/2011)   History of stomach ulcers    Hypotension    Iron deficiency anemia    New onset a-fib (HCC)    10/15/14 EKG   Osteopenia    Peripheral neuropathy 11/2018   Pneumonia    x several but years ago   Pressure ulcer of sacral region, stage 1 07/2019   Seizures (HCC) 1994   "post  transplant; only have had that one" (12/11/2011)   Spinal stenosis in cervical region    Stroke [facility])     left basal ganglia lacunar infarct; Right frontal lobe lacunar infarct.   Stroke [facility]) ~ 1999; 2001   "briefly lost my vision; lost my right eye" (12/11/2011)   Vitamin D deficiency 12/2018   Past Surgical History:  Procedure Laterality Date   ANTERIOR CERVICAL DECOMP/DISCECTOMY FUSION N/A 01/08/2015   Procedure: Anterior Cervical Three-Four/Four-Five Decompression/Diskectomy/Fusion;  Surgeon: Hilda Lias, MD;  Location: [facility] NEURO ORS;  Service: Neurosurgery;  Laterality: N/A;  C3-4 C4-5 Anterior cervical decompression/diskectomy/fusion   APPENDECTOMY  ~ 2004   BIOPSY  09/12/2020   Procedure: BIOPSY;  Surgeon: Rachael Fee, MD;  Location: [facility] ENDOSCOPY;  Service: Endoscopy;;   BRONCHIAL BIOPSY  11/16/2022   Procedure: BRONCHIAL BIOPSIES;  Surgeon: Leslye Peer, MD;  Location: [facility] ENDOSCOPY;  Service: Pulmonary;;   BRONCHIAL BRUSHINGS  11/16/2022   Procedure: BRONCHIAL BRUSHINGS;  Surgeon: Leslye Peer, MD;  Location: [facility] ENDOSCOPY;  Service: Pulmonary;;   BRONCHIAL NEEDLE ASPIRATION BIOPSY  11/16/2022   Procedure: BRONCHIAL NEEDLE ASPIRATION BIOPSIES;  Surgeon: Leslye Peer, MD;  Location: [facility] ENDOSCOPY;  Service: Pulmonary;;   CATARACT EXTRACTION     right eye   COLONOSCOPY     COLONOSCOPY WITH PROPOFOL N/A 09/12/2020   Procedure: COLONOSCOPY WITH PROPOFOL;  Surgeon: Rachael Fee, MD;  Location: [facility] ENDOSCOPY;  Service: Endoscopy;  Laterality: N/A;   ENUCLEATION  2001   "right"   ESOPHAGOGASTRODUODENOSCOPY (EGD) WITH PROPOFOL N/A 04/21/2012   Procedure: ESOPHAGOGASTRODUODENOSCOPY (EGD) WITH PROPOFOL;  Surgeon: Rachael Fee, MD;  Location: [facility] ENDOSCOPY;  Service: Endoscopy;  Laterality: N/A;   ESOPHAGOGASTRODUODENOSCOPY (EGD) WITH PROPOFOL N/A 09/12/2020   Procedure: ESOPHAGOGASTRODUODENOSCOPY (EGD) WITH PROPOFOL;  Surgeon: Rachael Fee, MD;  Location: [facility] ENDOSCOPY;   Service: Endoscopy;  Laterality: N/A;   FIDUCIAL MARKER PLACEMENT  11/16/2022   Procedure: FIDUCIAL MARKER PLACEMENT;  Surgeon: Leslye Peer, MD;  Location[facility] ENDOSCOPY;  Service: Pulmonary;;   HEMOSTASIS CONTROL  11/16/2022   Procedure: HEMOSTASIS CONTROL;  Surgeon: Leslye Peer, MD;  Location: [facility] ENDOSCOPY;  Service: Pulmonary;;   INSERTION OF DIALYSIS CATHETER  1988   "AV graft LUA & LFA; LUA worked for 1 day; LFA never worked"   IR FLUORO GUIDE CV LINE RIGHT  07/11/2019   IR FLUORO GUIDE CV LINE RIGHT  10/20/2019   IR FLUORO GUIDE CV LINE RIGHT  05/31/2020   IR PTA VENOUS EXCEPT DIALYSIS CIRCUIT  05/31/2020   IR RADIOLOGY PERIPHERAL GUIDED IV START  07/11/2019   IR US GUIDE VASC ACCESS RIGHT  07/11/2019   IR US GUIDE VASC ACCESS RIGHT  07/11/2019   IR US GUIDE VASC ACCESS RIGHT  07/11/2019   IR VENOCAVAGRAM IVC  05/31/2020   KIDNEY TRANSPLANT  1994; 1999; 2005   "right"   MULTIPLE TOOTH EXTRACTIONS     POLYPECTOMY  09/12/2020   Procedure: POLYPECTOMY;  Surgeon: Rachael Fee, MD;  Location: [facility] ENDOSCOPY;  Service: Endoscopy;;   RIGHT HEART CATH N/A 03/23/2019   Procedure: RIGHT HEART CATH;  Surgeon: Dolores Patty, MD;  Location: [facility] INVASIVE CV LAB;  Service: Cardiovascular;  Laterality: N/A;   TONSILLECTOMY     TOTAL NEPHRECTOMY  1988?; 1994; 2005   VIDEO BRONCHOSCOPY WITH RADIAL ENDOBRONCHIAL ULTRASOUND  11/16/2022   Procedure: VIDEO BRONCHOSCOPY WITH RADIAL ENDOBRONCHIAL ULTRASOUND;  Surgeon: Leslye Peer, MD;  Location: [facility] ENDOSCOPY;  Service: Pulmonary;;   Patient Active Problem List   Diagnosis Date Noted   Pulmonary nodule 1 cm or greater in diameter 10/28/2022   COVID-19 10/02/2022   Encounter for monitoring amiodarone therapy 07/01/2022   Atrial fibrillation (HCC) 07/01/2022   Bilateral hip pain 01/14/2022   Prolonged Q-T interval on ECG 09/07/2021   Nausea & vomiting 09/06/2021   Bilateral shoulder pain 06/06/2021   Bilateral hand swelling 06/06/2021   Cervical  radiculitis 01/24/2021   Acute respiratory failure with hypoxia (HCC) 10/23/2020   Coagulation defect, unspecified (HCC) 08/01/2020   Aortic atherosclerosis (HCC) 07/10/2020   Hypercoagulable state due to atrial fibrillation (HCC) 04/23/2020   Atrial flutter (HCC) 03/29/2020   Chronic pain of both knees 03/13/2020   Chills (without fever) 12/12/2019   Other pancytopenia (HCC)    Decubitus ulcer of sacral region, stage 1 08/18/2019   HCAP (healthcare-associated pneumonia) 08/05/2019   Pressure injury of skin 07/24/2019   Chest congestion 07/24/2019   Itching 07/24/2019   Weakness 06/13/2019   Pneumonia due to COVID-19 virus 05/09/2019   Hypotension 05/09/2019   Symptomatic anemia 05/09/2019   CHF (congestive heart failure) (HCC) 05/09/2019  Swollen abdomen 01/04/2019   DDD (degenerative disc disease), cervical 12/06/2018   Neuropathy 12/06/2018   Paresthesia 10/11/2018   Neck pain 10/11/2018   Tobacco abuse 11/20/2017   Right leg swelling 11/20/2017   Right leg pain 11/20/2017   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   GERD (gastroesophageal reflux disease) 11/20/2017   Acute venous embolism and thrombosis of deep vessels of proximal lower extremity, right (HCC) 11/20/2017   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    Thrush, oral    AKI (acute kidney injury) (HCC)    Multifocal pneumonia 08/09/2017   ETD (Eustachian tube dysfunction), bilateral 08/03/2017   Acute recurrent pansinusitis 07/21/2017   Chest pain 09/29/2015   Cervical stenosis of spinal canal 01/08/2015   Urinary tract infectious disease    Muscle spasms of neck 06/15/2014   Bleeding hemorrhoid 06/15/2014   Anemia in chronic kidney disease 06/15/2014   Pyelonephritis, acute 06/13/2014   Sepsis (HCC) 06/13/2014   History of kidney transplant    Abnormal CT scan 09/14/2012   Chronic pain syndrome 04/09/2012   Dehydration, mild 04/09/2012   Herpes infection 06/23/2011    Anxiety 06/23/2011   E coli bacteremia 06/18/2011   ESRD (end stage renal disease) (HCC) 06/12/2011   Lower urinary tract infectious disease 06/12/2011   Bacteremia due to Gram-negative bacteria 05/23/2011   History of renal transplantation 05/22/2011   Septic shock (HCC) 05/22/2011   Acute on chronic kidney failure (HCC) 05/22/2011   Gastroparesis 04/24/2008   WEIGHT LOSS 08/24/2007   NAUSEA AND VOMITING 08/24/2007    PCP: Ivonne Andrew, NP  REFERRING PROVIDER: Unknown Jim, MD  REFERRING DIAG: Bil adhesive capsulitis   THERAPY DIAG:  Chronic pain of both shoulders  Muscle weakness (generalized)  Chronic left shoulder pain  Chronic right shoulder pain  Rationale for Evaluation and Treatment: Rehabilitation  ONSET DATE: Chronic, ongoing for years   SUBJECTIVE:                                                                                                                                                                                     SUBJECTIVE STATEMENT: Patient reports no pain in her shoulder today, had a bronchoscopy on Monday and reports a sore throat and chest likely from intubation.   PAIN:  Are you having pain? Yes:  NPRS scale: 0/10 Pain location: Bilateral shoulders Pain description: constant aching Aggravating factors: Cooking, washing dishes, grooming, reaching into cabinet Relieving factors: Rest  PERTINENT HISTORY: History of stroke, DVT, CHF, Afib, Neuropathy, neck pain with ACDF C3-5 in 2016, gout, DDD, ESRD, cervical stenosis, dialysis, kidney transplant (failed), see complex medical  history above  PRECAUTIONS: See pertinent medical history  WEIGHT BEARING RESTRICTIONS: No  PATIENT GOALS: Improve shoulder pain and ability to reach overhead, put hair in ponytail   OBJECTIVE:  PATIENT SURVEYS:  FOTO 56 % functional status  09/09/2022: 53% 10/14/2022: 55%  POSTURE: Rounded shoulder and forward head posture  UPPER EXTREMITY ROM:    ROM Right eval Left eval Rt / Lt 08/12/2022 Rt / Lt 08/17/2022 R/L 8/7 Rt / Lt 10/14/2022  Shoulder flexion 90 100 105 / 100 95 / 100 90/115 105 / 118  Shoulder extension        Shoulder abduction 65 90      Shoulder adduction        Shoulder internal rotation Reach to lateral iliac crest Reach to PSIS      Shoulder external rotation Unable to reach to head Reach to occiput    Reach to occiput but with significant pain  Elbow flexion        Elbow extension        Wrist flexion        Wrist extension        Wrist ulnar deviation        Wrist radial deviation        Wrist pronation        Wrist supination        (Blank rows = not tested)  UPPER EXTREMITY MMT:    Strength assessment within available range  MMT Right eval Left eval  Shoulder flexion 3 3  Shoulder extension    Shoulder abduction 3 3  Shoulder adduction    Shoulder internal rotation    Shoulder external rotation    Middle trapezius    Lower trapezius    Elbow flexion    Elbow extension    Wrist flexion    Wrist extension    Wrist ulnar deviation    Wrist radial deviation    Wrist pronation    Wrist supination    Grip strength (lbs)    (Blank rows = not tested)  JOINT MOBILITY TESTING:  Not assessed  PALPATION:  Generalized tenderness of bilateral shoulders    TODAY'S TREATMENT:   [facility] Adult PT Treatment:                                                DATE: 11/18/22 Therapeutic Exercise: Nustep L4 x 8 min with UE/LE while taking subjective, working on shoulder motion Rows RTB 2x10 Shoulder extension RTB 2x10 Reclined table: Dowel AAROM ER/IR x10 BIL ER/IR x10 each BIL clinician resistance RTB Shoulder flexion with dowel small range 2x10 Modalities: MHP to Lt shoulder x10 mins post session   [facility] Adult PT Treatment:                                                DATE: 11/11/22 Therapeutic Exercise: Nustep L3 x 6 min with UE/LE while taking subjective, working on shoulder motion Rows  RTB 2x10 Shoulder extension RTB 2x10 Seated overhead pulley for shoulder flexion/scaption x 3 min Reclined table: Dowel AAROM ER/IR x10 BIL ER/IR x10 each BIL clinician resistance YTB Double ER with yellow 2x10 Shoulder flexion with dowel small range 2x10   [facility] Adult PT  Treatment:                                                DATE: 10/27/22 Therapeutic Exercise: Nustep L3 x 6 min with UE/LE while taking subjective, working on shoulder motion Seated overhead pulley for shoulder flexion x 3 min Reclined table: Dowel AAROM ER/IR ER/IR x10 each BIL clinician resistance YTB Double ER with yellow 2x10 Shoulder flexion with dowel very small range x10 Modalities: MHP to BIL shoulders, pt reclined on table x10 mins post session       PATIENT EDUCATION: Education details: POC extension, HEP Person educated: Patient Education method: Explanation, Demonstration, Tactile cues, Verbal cues Education comprehension: verbalized understanding, returned demonstration, verbal cues required, tactile cues required, and needs further education  HOME EXERCISE PROGRAM: Access Code: [email or web address]    ASSESSMENT: CLINICAL IMPRESSION: Patient presents to PT reporting continued relief from shoulder injections and 0/10 pain today. She endorses some chest pain and a sore throat from surgery on Monday where she was intubated. She requires more frequent rest breaks today due to fatigue from surgery. Session today continued to focus on RTC and periscapular strengthening as well as shoulder ROM. Patient continues to benefit from skilled PT services and should be progressed as able to improve functional independence.    OBJECTIVE IMPAIRMENTS: decreased activity tolerance, decreased ROM, decreased strength, postural dysfunction, and pain.   ACTIVITY LIMITATIONS: carrying, lifting, bathing, dressing, reach over head, and hygiene/grooming  PARTICIPATION LIMITATIONS: meal prep, cleaning, laundry, shopping, and  community activity  PERSONAL FACTORS: Fitness, Past/current experiences, Social background, Time since onset of injury/illness/exacerbation, and 3+ comorbidities: see pertinent medical history above  are also affecting patient's functional outcome.    GOALS: Goals reviewed with patient? Yes  SHORT TERM GOALS: Target date: 08/03/2022  Patient will be I with initial HEP in order to progress with therapy. Baseline: HEP provided at eval 08/12/2022: progressing 10/14/2022: independent Goal status: MET  2.  Patient will report bilateral shoulder pain </= 3/10 in order to reduce functional limitations Baseline: patient reports shoulder pain 6/10 08/12/2022: patient reports shoulder pain 3/10 Goal status: MET  LONG TERM GOALS: Target date: 12/09/2022  Patient will be I with final HEP to maintain progress from PT. Baseline: HEP provided at eval 8/7: independent 10/14/2022: progressing Goal status: ONGOING  2.  Patient will report >/= 63% status on FOTO to indicate improved functional ability. Baseline: 56% functional status 8/7: 53 10/14/2022: 55% Goal status: ONGOING  3.  Patient will demonstrate bilateral shoulder AROM flexion >/= 110 deg in order to improve reach into low shelf Baseline: shoulder AROM flexion 90 deg 8/7: R: 90, L 115 10/14/2022: Right 105, Left 118 Goal status: ONGOING  4.  Patient will be able to reach to occiput with right arm to demonstrate improved functional ER and ability to put her hair in a ponytail Baseline: patient unable to perform due to pain and limited motion 8/7: Top of head with significant pain 10/14/2022: able to reach occiput but with significant pain Goal status: PARTIALLY MET   PLAN: PT FREQUENCY: 1x/week  PT DURATION: 8 weeks  PLANNED INTERVENTIONS: Therapeutic exercises, Therapeutic activity, Neuromuscular re-education, Balance training, Gait training, Patient/Family education, Self Care, Joint mobilization, Aquatic Therapy, Dry Needling,  Cryotherapy, Moist heat, Manual therapy, and Re-evaluation  PLAN FOR NEXT SESSION: Review HEP and progress PRN, manual/stretching for bilateral shoulders as  tolerated, progressive strengthening and ROM exercises   Berta Minor PTA 11/18/22  9:52 AM Phone: [phone] Fax: [phone]

## 2022-11-18 ENCOUNTER — Ambulatory Visit: Payer: Medicare Other

## 2022-11-18 DIAGNOSIS — G8929 Other chronic pain: Secondary | ICD-10-CM

## 2022-11-18 DIAGNOSIS — M6281 Muscle weakness (generalized): Secondary | ICD-10-CM

## 2022-11-18 DIAGNOSIS — M25511 Pain in right shoulder: Secondary | ICD-10-CM | POA: Diagnosis not present

## 2022-11-20 ENCOUNTER — Telehealth: Payer: Self-pay | Admitting: Emergency Medicine

## 2022-11-20 DIAGNOSIS — C3490 Malignant neoplasm of unspecified part of unspecified bronchus or lung: Secondary | ICD-10-CM

## 2022-11-20 NOTE — Telephone Encounter (Signed)
Spoke with the patient by phone to review bronchoscopy results.  This shows malignancy, IHC pending.  She will be referred to medical oncology at [facility].

## 2022-11-23 ENCOUNTER — Telehealth: Payer: Self-pay | Admitting: Emergency Medicine

## 2022-11-23 NOTE — Telephone Encounter (Signed)
Ruby calling with call report. For CT scan. Ruby phone number is [phone].

## 2022-11-23 NOTE — Telephone Encounter (Signed)
Received call report on CT from [facility] with [place] radiology.   Dr. Delton Coombes, please advise. Thanks

## 2022-11-23 NOTE — Telephone Encounter (Signed)
This has already been addressed

## 2022-11-25 ENCOUNTER — Ambulatory Visit: Payer: Medicare Other

## 2022-11-25 DIAGNOSIS — G8929 Other chronic pain: Secondary | ICD-10-CM

## 2022-11-25 DIAGNOSIS — M25511 Pain in right shoulder: Secondary | ICD-10-CM | POA: Diagnosis not present

## 2022-11-25 DIAGNOSIS — M6281 Muscle weakness (generalized): Secondary | ICD-10-CM

## 2022-11-25 NOTE — Progress Notes (Signed)
I have reached out to pathology to check of the status of the pt's IHC results.

## 2022-11-25 NOTE — Therapy (Signed)
OUTPATIENT PHYSICAL THERAPY TREATMENT NOTE    Patient Name: Tina Watkins MRN: [id number] DOB:December 26, 1965, 57 y.o., female Today's Date: 11/25/2022   END OF SESSION:  PT End of Session - 11/25/22 0917     Visit Number 12    Number of Visits 16    Date for PT Re-Evaluation 12/09/22    Authorization Type MCR / MCD    PT Start Time 0916    PT Stop Time 1006    PT Time Calculation (min) 50 min    Activity Tolerance Patient tolerated treatment well;Patient limited by pain    Behavior During Therapy [facility] for tasks assessed/performed               Past Medical History:  Diagnosis Date   Anxiety    Bacteremia due to Gram-negative bacteria 05/23/2011   Blind    right eye   Blind right eye    CHF (congestive heart failure) (HCC)    Chronic lower back pain    Complication of anesthesia    "woke up during OR; I have an extremely high tolerance" (12/11/2011) 1 procedure was graft; the other procedures were procedures that are typically done with sedation.   DDD (degenerative disc disease), cervical    Depression    Dysrhythmia    "tachycardia" (12/11/2011) new onset afib 10/15/14 EKG   E coli bacteremia 06/18/2011   Elevated LDL cholesterol level 12/2018   ESRD (end stage renal disease) (HCC) 06/12/2011   Tues-Thurs-Sat dialysis   Fibromyalgia    Gastroparesis    GERD (gastroesophageal reflux disease)    Glaucoma    right eye   Gout    H/O carpal tunnel syndrome    Headache 10/2019   no current problem per patient on 11/12/22   Headache(784.0)    "not often anymore" (12/11/2011)   Herpes genitalia 1994   History of blood transfusion    "more than a few times" (12/11/2011)   History of stomach ulcers    Hypotension    Iron deficiency anemia    New onset a-fib (HCC)    10/15/14 EKG   Osteopenia    Peripheral neuropathy 11/2018   Pneumonia    x several but years ago   Pressure ulcer of sacral region, stage 1 07/2019   Seizures (HCC) 1994   "post transplant; only  have had that one" (12/11/2011)   Spinal stenosis in cervical region    Stroke [facility])     left basal ganglia lacunar infarct; Right frontal lobe lacunar infarct.   Stroke [facility]) ~ 1999; 2001   "briefly lost my vision; lost my right eye" (12/11/2011)   Vitamin D deficiency 12/2018   Past Surgical History:  Procedure Laterality Date   ANTERIOR CERVICAL DECOMP/DISCECTOMY FUSION N/A 01/08/2015   Procedure: Anterior Cervical Three-Four/Four-Five Decompression/Diskectomy/Fusion;  Surgeon: Hilda Lias, MD;  Location: [facility] NEURO ORS;  Service: Neurosurgery;  Laterality: N/A;  C3-4 C4-5 Anterior cervical decompression/diskectomy/fusion   APPENDECTOMY  ~ 2004   BIOPSY  09/12/2020   Procedure: BIOPSY;  Surgeon: Rachael Fee, MD;  Location: [facility] ENDOSCOPY;  Service: Endoscopy;;   BRONCHIAL BIOPSY  11/16/2022   Procedure: BRONCHIAL BIOPSIES;  Surgeon: Leslye Peer, MD;  Location: [facility] ENDOSCOPY;  Service: Pulmonary;;   BRONCHIAL BRUSHINGS  11/16/2022   Procedure: BRONCHIAL BRUSHINGS;  Surgeon: Leslye Peer, MD;  Location: [facility] ENDOSCOPY;  Service: Pulmonary;;   BRONCHIAL NEEDLE ASPIRATION BIOPSY  11/16/2022   Procedure: BRONCHIAL NEEDLE ASPIRATION BIOPSIES;  Surgeon: Leslye Peer, MD;  Location: [facility] ENDOSCOPY;  Service: Pulmonary;;   CATARACT EXTRACTION     right eye   COLONOSCOPY     COLONOSCOPY WITH PROPOFOL N/A 09/12/2020   Procedure: COLONOSCOPY WITH PROPOFOL;  Surgeon: Rachael Fee, MD;  Location: [facility] ENDOSCOPY;  Service: Endoscopy;  Laterality: N/A;   ENUCLEATION  2001   "right"   ESOPHAGOGASTRODUODENOSCOPY (EGD) WITH PROPOFOL N/A 04/21/2012   Procedure: ESOPHAGOGASTRODUODENOSCOPY (EGD) WITH PROPOFOL;  Surgeon: Rachael Fee, MD;  Location: [facility] ENDOSCOPY;  Service: Endoscopy;  Laterality: N/A;   ESOPHAGOGASTRODUODENOSCOPY (EGD) WITH PROPOFOL N/A 09/12/2020   Procedure: ESOPHAGOGASTRODUODENOSCOPY (EGD) WITH PROPOFOL;  Surgeon: Rachael Fee, MD;  Location: [facility] ENDOSCOPY;  Service:  Endoscopy;  Laterality: N/A;   FIDUCIAL MARKER PLACEMENT  11/16/2022   Procedure: FIDUCIAL MARKER PLACEMENT;  Surgeon: Leslye Peer, MD;  Location: [facility] ENDOSCOPY;  Service: Pulmonary;;   HEMOSTASIS CONTROL  11/16/2022   Procedure: HEMOSTASIS CONTROL;  Surgeon: Leslye Peer, MD;  Location: [facility] ENDOSCOPY;  Service: Pulmonary;;   INSERTION OF DIALYSIS CATHETER  1988   "AV graft LUA & LFA; LUA worked for 1 day; LFA never worked"   IR FLUORO GUIDE CV LINE RIGHT  07/11/2019   IR FLUORO GUIDE CV LINE RIGHT  10/20/2019   IR FLUORO GUIDE CV LINE RIGHT  05/31/2020   IR PTA VENOUS EXCEPT DIALYSIS CIRCUIT  05/31/2020   IR RADIOLOGY PERIPHERAL GUIDED IV START  07/11/2019   IR US GUIDE VASC ACCESS RIGHT  07/11/2019   IR US GUIDE VASC ACCESS RIGHT  07/11/2019   IR US GUIDE VASC ACCESS RIGHT  07/11/2019   IR VENOCAVAGRAM IVC  05/31/2020   KIDNEY TRANSPLANT  1994; 1999; 2005   "right"   MULTIPLE TOOTH EXTRACTIONS     POLYPECTOMY  09/12/2020   Procedure: POLYPECTOMY;  Surgeon: Rachael Fee, MD;  Location: [facility] ENDOSCOPY;  Service: Endoscopy;;   RIGHT HEART CATH N/A 03/23/2019   Procedure: RIGHT HEART CATH;  Surgeon: Dolores Patty, MD;  Location: [facility] INVASIVE CV LAB;  Service: Cardiovascular;  Laterality: N/A;   TONSILLECTOMY     TOTAL NEPHRECTOMY  1988?; 1994; 2005   VIDEO BRONCHOSCOPY WITH RADIAL ENDOBRONCHIAL ULTRASOUND  11/16/2022   Procedure: VIDEO BRONCHOSCOPY WITH RADIAL ENDOBRONCHIAL ULTRASOUND;  Surgeon: Leslye Peer, MD;  Location: [facility] ENDOSCOPY;  Service: Pulmonary;;   Patient Active Problem List   Diagnosis Date Noted   Pulmonary nodule 1 cm or greater in diameter 10/28/2022   COVID-19 10/02/2022   Encounter for monitoring amiodarone therapy 07/01/2022   Atrial fibrillation (HCC) 07/01/2022   Bilateral hip pain 01/14/2022   Prolonged Q-T interval on ECG 09/07/2021   Nausea & vomiting 09/06/2021   Bilateral shoulder pain 06/06/2021   Bilateral hand swelling 06/06/2021   Cervical  radiculitis 01/24/2021   Acute respiratory failure with hypoxia (HCC) 10/23/2020   Coagulation defect, unspecified (HCC) 08/01/2020   Aortic atherosclerosis (HCC) 07/10/2020   Hypercoagulable state due to atrial fibrillation (HCC) 04/23/2020   Atrial flutter (HCC) 03/29/2020   Chronic pain of both knees 03/13/2020   Chills (without fever) 12/12/2019   Other pancytopenia (HCC)    Decubitus ulcer of sacral region, stage 1 08/18/2019   HCAP (healthcare-associated pneumonia) 08/05/2019   Pressure injury of skin 07/24/2019   Chest congestion 07/24/2019   Itching 07/24/2019   Weakness 06/13/2019   Pneumonia due to COVID-19 virus 05/09/2019   Hypotension 05/09/2019   Symptomatic anemia 05/09/2019   CHF (congestive heart failure) (HCC) 05/09/2019   Swollen abdomen 01/04/2019  DDD (degenerative disc disease), cervical 12/06/2018   Neuropathy 12/06/2018   Paresthesia 10/11/2018   Neck pain 10/11/2018   Tobacco abuse 11/20/2017   Right leg swelling 11/20/2017   Right leg pain 11/20/2017   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   GERD (gastroesophageal reflux disease) 11/20/2017   Acute venous embolism and thrombosis of deep vessels of proximal lower extremity, right (HCC) 11/20/2017   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    Thrush, oral    AKI (acute kidney injury) (HCC)    Multifocal pneumonia 08/09/2017   ETD (Eustachian tube dysfunction), bilateral 08/03/2017   Acute recurrent pansinusitis 07/21/2017   Chest pain 09/29/2015   Cervical stenosis of spinal canal 01/08/2015   Urinary tract infectious disease    Muscle spasms of neck 06/15/2014   Bleeding hemorrhoid 06/15/2014   Anemia in chronic kidney disease 06/15/2014   Pyelonephritis, acute 06/13/2014   Sepsis (HCC) 06/13/2014   History of kidney transplant    Abnormal CT scan 09/14/2012   Chronic pain syndrome 04/09/2012   Dehydration, mild 04/09/2012   Herpes infection 06/23/2011    Anxiety 06/23/2011   E coli bacteremia 06/18/2011   ESRD (end stage renal disease) (HCC) 06/12/2011   Lower urinary tract infectious disease 06/12/2011   Bacteremia due to Gram-negative bacteria 05/23/2011   History of renal transplantation 05/22/2011   Septic shock (HCC) 05/22/2011   Acute on chronic kidney failure (HCC) 05/22/2011   Gastroparesis 04/24/2008   WEIGHT LOSS 08/24/2007   NAUSEA AND VOMITING 08/24/2007    PCP: Ivonne Andrew, NP  REFERRING PROVIDER: Unknown Jim, MD  REFERRING DIAG: Bil adhesive capsulitis   THERAPY DIAG:  Chronic pain of both shoulders  Muscle weakness (generalized)  Chronic left shoulder pain  Chronic right shoulder pain  Rationale for Evaluation and Treatment: Rehabilitation  ONSET DATE: Chronic, ongoing for years   SUBJECTIVE:                                                                                                                                                                                     SUBJECTIVE STATEMENT: Patient reports that the injection is beginning to wear off and that her shoulders are beginning to bother her more, R>L.  PAIN:  Are you having pain? Yes:  NPRS scale: 3/10 Pain location: Bilateral shoulders Pain description: constant aching Aggravating factors: Cooking, washing dishes, grooming, reaching into cabinet Relieving factors: Rest  PERTINENT HISTORY: History of stroke, DVT, CHF, Afib, Neuropathy, neck pain with ACDF C3-5 in 2016, gout, DDD, ESRD, cervical stenosis, dialysis, kidney transplant (failed), see complex medical history above  PRECAUTIONS: See pertinent medical history  WEIGHT BEARING RESTRICTIONS: No  PATIENT GOALS: Improve shoulder pain and ability to reach overhead, put hair in ponytail   OBJECTIVE:  PATIENT SURVEYS:  FOTO 56 % functional status  09/09/2022: 53% 10/14/2022: 55%  POSTURE: Rounded shoulder and forward head posture  UPPER EXTREMITY ROM:   ROM  Right eval Left eval Rt / Lt 08/12/2022 Rt / Lt 08/17/2022 R/L 8/7 Rt / Lt 10/14/2022  Shoulder flexion 90 100 105 / 100 95 / 100 90/115 105 / 118  Shoulder extension        Shoulder abduction 65 90      Shoulder adduction        Shoulder internal rotation Reach to lateral iliac crest Reach to PSIS      Shoulder external rotation Unable to reach to head Reach to occiput    Reach to occiput but with significant pain  Elbow flexion        Elbow extension        Wrist flexion        Wrist extension        Wrist ulnar deviation        Wrist radial deviation        Wrist pronation        Wrist supination        (Blank rows = not tested)  UPPER EXTREMITY MMT:    Strength assessment within available range  MMT Right eval Left eval  Shoulder flexion 3 3  Shoulder extension    Shoulder abduction 3 3  Shoulder adduction    Shoulder internal rotation    Shoulder external rotation    Middle trapezius    Lower trapezius    Elbow flexion    Elbow extension    Wrist flexion    Wrist extension    Wrist ulnar deviation    Wrist radial deviation    Wrist pronation    Wrist supination    Grip strength (lbs)    (Blank rows = not tested)  JOINT MOBILITY TESTING:  Not assessed  PALPATION:  Generalized tenderness of bilateral shoulders    TODAY'S TREATMENT:   [facility] Adult PT Treatment:                                                DATE: 11/25/22 Therapeutic Exercise: Nustep L4 x 8 min with UE/LE while taking subjective, working on shoulder motion Rows RTB 2x10 Shoulder extension RTB 2x10 Finger ladder with hold at top for stretch x5 BIL Reclined table: Dowel AAROM ER/IR 2x10 BIL ER/IR x10 each BIL clinician resistance YTB Shoulder flexion with dowel small range 2x10 Modalities: MHP to BIL shoulder x10 mins post session   [facility] Adult PT Treatment:                                                DATE: 11/18/22 Therapeutic Exercise: Nustep L4 x 8 min with UE/LE while taking  subjective, working on shoulder motion Rows RTB 2x10 Shoulder extension RTB 2x10 Reclined table: Dowel AAROM ER/IR x10 BIL ER/IR x10 each BIL clinician resistance RTB Shoulder flexion with dowel small range 2x10 Modalities: MHP to Lt shoulder x10 mins post session   [facility] Adult PT Treatment:  DATE: 11/11/22 Therapeutic Exercise: Nustep L3 x 6 min with UE/LE while taking subjective, working on shoulder motion Rows RTB 2x10 Shoulder extension RTB 2x10 Seated overhead pulley for shoulder flexion/scaption x 3 min Reclined table: Dowel AAROM ER/IR x10 BIL ER/IR x10 each BIL clinician resistance YTB Double ER with yellow 2x10 Shoulder flexion with dowel small range 2x10       PATIENT EDUCATION: Education details: POC extension, HEP Person educated: Patient Education method: Explanation, Demonstration, Tactile cues, Verbal cues Education comprehension: verbalized understanding, returned demonstration, verbal cues required, tactile cues required, and needs further education  HOME EXERCISE PROGRAM: Access Code: [email or web address]    ASSESSMENT: CLINICAL IMPRESSION: Patient presents to PT reporting mild pain in BIL shoulders and that she believes the injections are beginning to wear off. Session today continued to focus on RTC and periscapular strengthening as able in her pain tolerance. Patient continues to benefit from skilled PT services and should be progressed as able to improve functional independence.   OBJECTIVE IMPAIRMENTS: decreased activity tolerance, decreased ROM, decreased strength, postural dysfunction, and pain.   ACTIVITY LIMITATIONS: carrying, lifting, bathing, dressing, reach over head, and hygiene/grooming  PARTICIPATION LIMITATIONS: meal prep, cleaning, laundry, shopping, and community activity  PERSONAL FACTORS: Fitness, Past/current experiences, Social background, Time since onset of injury/illness/exacerbation, and 3+  comorbidities: see pertinent medical history above  are also affecting patient's functional outcome.    GOALS: Goals reviewed with patient? Yes  SHORT TERM GOALS: Target date: 08/03/2022  Patient will be I with initial HEP in order to progress with therapy. Baseline: HEP provided at eval 08/12/2022: progressing 10/14/2022: independent Goal status: MET  2.  Patient will report bilateral shoulder pain </= 3/10 in order to reduce functional limitations Baseline: patient reports shoulder pain 6/10 08/12/2022: patient reports shoulder pain 3/10 Goal status: MET  LONG TERM GOALS: Target date: 12/09/2022  Patient will be I with final HEP to maintain progress from PT. Baseline: HEP provided at eval 8/7: independent 10/14/2022: progressing Goal status: ONGOING  2.  Patient will report >/= 63% status on FOTO to indicate improved functional ability. Baseline: 56% functional status 8/7: 53 10/14/2022: 55% Goal status: ONGOING  3.  Patient will demonstrate bilateral shoulder AROM flexion >/= 110 deg in order to improve reach into low shelf Baseline: shoulder AROM flexion 90 deg 8/7: R: 90, L 115 10/14/2022: Right 105, Left 118 Goal status: ONGOING  4.  Patient will be able to reach to occiput with right arm to demonstrate improved functional ER and ability to put her hair in a ponytail Baseline: patient unable to perform due to pain and limited motion 8/7: Top of head with significant pain 10/14/2022: able to reach occiput but with significant pain Goal status: PARTIALLY MET   PLAN: PT FREQUENCY: 1x/week  PT DURATION: 8 weeks  PLANNED INTERVENTIONS: Therapeutic exercises, Therapeutic activity, Neuromuscular re-education, Balance training, Gait training, Patient/Family education, Self Care, Joint mobilization, Aquatic Therapy, Dry Needling, Cryotherapy, Moist heat, Manual therapy, and Re-evaluation  PLAN FOR NEXT SESSION: Review HEP and progress PRN, manual/stretching for bilateral  shoulders as tolerated, progressive strengthening and ROM exercises   Berta Minor PTA 11/25/22  9:57 AM Phone: [phone] Fax: [phone]

## 2022-11-30 ENCOUNTER — Encounter: Payer: Self-pay | Admitting: Physical Therapy

## 2022-11-30 ENCOUNTER — Telehealth: Payer: Self-pay | Admitting: Physical Therapy

## 2022-11-30 NOTE — Telephone Encounter (Signed)
Attempted to contact patient due to missed PT appointment. Left voicemail informing patient of the missed appointment, her next scheduled appointment on 12/09/22, and if she can to call and rescheduled the missed appointment for later this week. Advised patient of attendance policy.  [name], PT, DPT, LAT, ATC 11/30/22  12:46 PM Phone: [phone] Fax: [phone]

## 2022-12-02 ENCOUNTER — Ambulatory Visit (INDEPENDENT_AMBULATORY_CARE_PROVIDER_SITE_OTHER): Payer: Medicare Other | Admitting: Gastroenterology

## 2022-12-02 ENCOUNTER — Encounter: Payer: Self-pay | Admitting: Gastroenterology

## 2022-12-02 VITALS — BP 98/60 | HR 69 | Ht 59.0 in | Wt 110.0 lb

## 2022-12-02 DIAGNOSIS — K219 Gastro-esophageal reflux disease without esophagitis: Secondary | ICD-10-CM

## 2022-12-02 DIAGNOSIS — K7460 Unspecified cirrhosis of liver: Secondary | ICD-10-CM

## 2022-12-02 DIAGNOSIS — K3184 Gastroparesis: Secondary | ICD-10-CM | POA: Diagnosis not present

## 2022-12-02 DIAGNOSIS — R1013 Epigastric pain: Secondary | ICD-10-CM | POA: Diagnosis not present

## 2022-12-02 MED ORDER — METOCLOPRAMIDE HCL 10 MG PO TABS: 10.0000 mg | ORAL_TABLET | Freq: Three times a day (TID) | ORAL | 2 refills | Status: DC

## 2022-12-02 NOTE — Patient Instructions (Signed)
We have sent the following medications to your pharmacy for you to pick up at your convenience: Reglan 10 mg three times a day.  You have been scheduled for a HIDA scan at [facility] (1st floor) on 12/21/22. Please arrive 15 minutes prior to your scheduled appointment at  7:30 am. Make certain not to have anything to eat or drink at least 6 hours prior to your test. Should this appointment date or time not work well for you, please call radiology scheduling at [phone].  _____________________________________________________________________ hepatobiliary (HIDA) scan is an imaging procedure used to diagnose problems in the liver, gallbladder and bile ducts. In the HIDA scan, a radioactive chemical or tracer is injected into a vein in your arm. The tracer is handled by the liver like bile. Bile is a fluid produced and excreted by your liver that helps your digestive system break down fats in the foods you eat. Bile is stored in your gallbladder and the gallbladder releases the bile when you eat a meal. A special nuclear medicine scanner (gamma camera) tracks the flow of the tracer from your liver into your gallbladder and small intestine.  During your HIDA scan  You'll be asked to change into a hospital gown before your HIDA scan begins. Your health care team will position you on a table, usually on your back. The radioactive tracer is then injected into a vein in your arm.The tracer travels through your bloodstream to your liver, where it's taken up by the bile-producing cells. The radioactive tracer travels with the bile from your liver into your gallbladder and through your bile ducts to your small intestine.You may feel some pressure while the radioactive tracer is injected into your vein. As you lie on the table, a special gamma camera is positioned over your abdomen taking pictures of the tracer as it moves through your body. The gamma camera takes pictures continually for about an hour.  You'll need to keep still during the HIDA scan. This can become uncomfortable, but you may find that you can lessen the discomfort by taking deep breaths and thinking about other things. Tell your health care team if you're uncomfortable. The radiologist will watch on a computer the progress of the radioactive tracer through your body. The HIDA scan may be stopped when the radioactive tracer is seen in the gallbladder and enters your small intestine. This typically takes about an hour. In some cases extra imaging will be performed if original images aren't satisfactory, if morphine is given to help visualize the gallbladder or if the medication CCK is given to look at the contraction of the gallbladder. This test typically takes 2 hours to complete. ________________________________________________________________________ Due to recent changes in healthcare laws, you may see the results of your imaging and laboratory studies on MyChart before your provider has had a chance to review them.  We understand that in some cases there may be results that are confusing or concerning to you. Not all laboratory results come back in the same time frame and the provider may be waiting for multiple results in order to interpret others.  Please give [name] 48 hours in order for your provider to thoroughly review all the results before contacting the office for clarification of your results.   The [facility] providers would like to encourage you to use [facility] to communicate with providers for non-urgent requests or questions.  Due to long hold times on the telephone, sending your provider a message by [facility] may be a  faster and more efficient way to get a response.  Please allow 48 business hours for a response.  Please remember that this is for non-urgent requests.   Thank you for choosing me and [facility].  Venita Lick. Pleas Koch., MD., Clementeen Graham

## 2022-12-02 NOTE — Progress Notes (Addendum)
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 2.3 cm x 4.4 cm, incompletely characterized  LUL malignant lung mass recently diagnosed  Cirrhosis per CT, MRI Personal history of adenomatous colon polyps  Chronic diastolic heart failure ESRD on HD Tues, Thurs, Sat Afib on Eliquis    Recommendations    Increase Reglan to 10 mg po tid ac for 3 months Schedule [facility] HIDA Nephrologist to review - consider adjusting dialysis  Continue omeprazole 40 mg qd and famotidine 20 mg bid CBC, CMP, PT/INR, AFP Defer further evaluation of pancreatic cyst and cirrhosis until lung malignancy addressed by Oncology  REV in 3 months   HPI    This is a 57 year old female patient of Dr. Christella Hartigan with epigastric pain, nausea and frequent vomiting. Her symptoms generally start at the end of HD. She has gastroparesis and GERD. Zofran and Relgan are helpful to reduce symptoms however often are not sufficient to control symptoms.  She was diagnosed with LUL lung cancer a couple weeks ago and she has Oncology appt next week. A pancreatic tail cystic lesion was incidentally noted on CT and was followed with an MRI - gallbladder, biliary tree appeared normal on CT and MRI.   EGD Aug 2022 - Small hiatal hernia and associated Cameron's erosions.  - Mild, non-specific gastritis. Biopsies taken to check for H. pylori  - The examination was otherwise normal. Path: reactive gastropathy, HP negative  Colonoscopy Aug 2022 - One 3 mm polyp in the ascending colon, removed with a cold snare. Resected and retrieved.  - Internal hemorrhoids which started to bleed after retroflex view and stopped without intervention.  - The examination was otherwise normal on direct and retroflexion views Path: TA  Labs / Imaging       Latest Ref Rng & Units 07/01/2022   10:13 AM 10/10/2021    7:41 AM 10/01/2021    10:50 AM  Hepatic Function  Total Protein 6.5 - 8.1 g/dL 6.8  7.2  6.8   Albumin 3.5 - 5.0 g/dL 3.8  4.4  4.2   AST 15 - 41 U/L 22  26  24    ALT 0 - 44 U/L 15  25  19    Alk Phosphatase 38 - 126 U/L 140  140  117   Total Bilirubin 0.3 - 1.2 mg/dL 0.7  0.8  0.6        Latest Ref Rng & Units 11/16/2022    6:36 AM 06/10/2022    6:05 PM 10/10/2021    7:41 AM  CBC  WBC 4.0 - 10.5 K/uL  5.5  4.9   Hemoglobin 12.0 - 15.0 g/dL 8.2  9.1  57.8   Hematocrit 36.0 - 46.0 % 24.0  30.3  34.1   Platelets 150 - 400 K/uL  194  172      CT Super D Chest Wo Contrast CLINICAL DATA:  Pulmonary nodule.  EXAM: CT CHEST WITHOUT CONTRAST  TECHNIQUE: Multidetector CT imaging of the chest was performed using thin slice collimation for electromagnetic bronchoscopy planning purposes, without intravenous contrast.  RADIATION DOSE REDUCTION: This exam was performed according to the departmental dose-optimization program which includes automated exposure control, adjustment of the mA and/or kV according to patient size and/or use of iterative reconstruction technique.  COMPARISON:  PET 10/19/2022, CT chest 09/28/2022 and 09/24/2021. CT abdomen 09/06/2021. MR abdomen 11/11/2022.  FINDINGS: Cardiovascular:  Atherosclerotic calcification of the aorta with age advanced involvement of the coronary arteries. Markedly enlarged pulmonic trunk. Heart is enlarged. No pericardial effusion. Dilated azygous and hemiazygous veins. Stent in the SVC.  Mediastinum/Nodes: Mediastinal lymph nodes measure up to 1.5 cm in the right paratracheal station, similar. Hilar regions are difficult to definitively evaluate without IV contrast. No axillary adenopathy. Esophagus is grossly unremarkable.  Lungs/Pleura: Centrilobular emphysema. Numerous tiny bilateral pulmonary nodules, as evaluated on 09/28/2022. Pleuroparenchymal scarring in the lower right hemithorax. Nodular consolidation in the anterior left upper lobe  persists, measuring 1.4 x 2.6 cm (4/68), previously approximately 1.4 x 2.2 cm on 09/28/2022. Trace left pleural fluid. Airway is unremarkable.  Upper Abdomen: Liver margin is slightly irregular. Increased hepatic parenchymal attenuation, attributable to amiodarone therapy. Visualized portions of the gallbladder, right adrenal gland and spleen are grossly unremarkable. Partially imaged cystic mass in the region of the pancreatic tail measures approximately 3.0 x 4.4 cm. Visualized portions of the stomach and bowel are grossly unremarkable. No upper abdominal adenopathy.  Musculoskeletal: Degenerative changes in the spine. Old rib fracture.  IMPRESSION: 1. Persistent subpleural consolidation in the anterior left upper lobe, stable to minimally progressive from 09/28/2022. Surrounding ground-glass, nodularity and septal thickening, as before. Findings are worrisome for malignancy. An indolent infectious process cannot be definitively excluded. These results will be called to the ordering clinician or representative by the Radiologist Assistant, and communication documented in the PACS or [employer]. 2. Mediastinal adenopathy. Recommend continued attention on follow-up. 3. Trace left pleural fluid. 4. Cirrhosis. 5. Partially imaged cystic mass in the region of the pancreatic tail, recently evaluated by MR abdomen 11/11/2022. 6. Age advanced coronary artery calcification. Aortic atherosclerosis (ICD10-I70.0). 7. Enlarged pulmonic trunk, indicative of pulmonary arterial hypertension. 8.  Emphysema (ICD10-J43.9).  Electronically Signed   By: Leanna Battles M.D.   On: 11/23/2022 10:49   Abd MRI 11/16/2022 1. Unfortunately, evaluation of the pancreatic tail is limited by metallic susceptibility artifact from adjacent surgical clips as well as iron burden in the adjacent splenic parenchyma. Particularly, there is essentially no meaningful assessment of the splenic tail on  multiphasic contrast enhanced sequences. Within this limitation, there is a complex, multi septated, internally heterogeneous cystic lesion arising from the tip of the pancreatic tail measuring 4.4 x 2.3 x 3.3 cm. Although contrast enhancement can not be confirmed due to technical limitations of today's examination, appearance is concerning for a partially cystic malignancy with a significant solid component. Consider tissue sampling. Given technical limitations of today's MR examination, would recommend multiphasic contrast enhanced CT for future assessment. 2. Severe hepatic and splenic iron deposition. 3. Coarse, nodular contour of the liver, consistent with cirrhosis. 4. Status post bilateral native nephrectomy. Right lower quadrant renal transplant graft, imaged on selected sequences. No hydronephrosis. 5. Diffusely heterogeneous bone marrow, which may reflect marrow reconversion and renal osteodystrophy.   Current Medications, Allergies, Past Medical History, Past Surgical History, Family History and Social History were reviewed in [employer] record.   Physical Exam: General: Well developed, thin, chronically ill appearing no acute distress Head: Normocephalic and atraumatic Eyes: Sclerae anicteric, EOMI, right eye  Ears: Normal auditory acuity Mouth: No deformities or lesions noted Lungs: Clear throughout to auscultation Heart: Regular rate and rhythm; No murmurs, rubs or bruits Abdomen: Soft, non tender and non distended. No masses, hepatosplenomegaly or hernias noted. Normal Bowel sounds Rectal: Not done Musculoskeletal: Symmetrical with no gross deformities  Pulses:  Normal pulses noted Extremities: No edema or deformities  noted Neurological: Alert oriented x 4, grossly nonfocal Psychological:  Alert and cooperative. Normal mood and affect   Bera Pinela T. Russella Dar, MD 12/02/2022, 2:42 PM

## 2022-12-05 NOTE — Progress Notes (Deleted)
Tina Watkins [phone]   [phone]  CONSULT NOTE  REFERRING PHYSICIAN: Dr. Delton Coombes   REASON FOR CONSULTATION:  Lung Cancer  HPI Tina Watkins is a 57 y.o. female with a past medical history for stroke, CHF, atrial fibrillation, pneumonia, gastroparesis, GERD, DDD, ESRD, history of renal transplant, and COPD  is referred to the clinic for lung cancer.   The patient was enrolled in the lung cancer screening program and had low dose CT on 09/28/22 which showed Dominant new solid peripheral left upper lobe pulmonary nodule measuring 21.4 mm with several new clustered solid pulmonary nodules throughout the left upper lobe, largest 5 mm. Numerous additional tiny 2-3 mm solid pulmonary nodules scattered in both lungs are new. Findings are equivocal for primary bronchogenic malignancy in the left upper lobe with metastases versus multilobar infection.She had endorsed associated cough. The scan also incidentally demonstrated two cystic pancreatic tail lesions, largest 2.6 cm posteriorly,      Dr. Delton Coombes arranged for bronchoscopy on 11/16/22 and the final pathology ([id number]) showed malignant cells of the LUL nodule ***. The IHC showed ***.   Of note, she sees Dr. Russella Dar from GI for gastroparesis for which she takes zofran and reglan. She had an MRI to follow up on the pancreatic lesion. She is scheduled for another imaging study on 12/21/22,   She subsequently had  a PET scan on 10/19/22 showing 2.6 cm subpleural nodule in the left upper lobe, unchanged from recent CT although new from 2023. While infection/inflammation remains possible, primary bronchogenic neoplasm cannot be excluded and 15 mm short axis prevascular node, new from 2023, indeterminate but possibly reactive.  Overall, she is feeling *** today. Denies any fever, chills, night sweats, or weight loss. Denies any chest pain, shortness of breath, cough, or hemoptysis. Denies any nausea,  vomiting, diarrhea, or constipation. Denies any headache or visual changes. Denies any rashes or skin changes.     HPI  Past Medical History:  Diagnosis Date   Anxiety    Bacteremia due to Gram-negative bacteria 05/23/2011   Blind    right eye   Blind right eye    CHF (congestive heart failure) (HCC)    Chronic lower back pain    Complication of anesthesia    "woke up during OR; I have an extremely high tolerance" (12/11/2011) 1 procedure was graft; the other procedures were procedures that are typically done with sedation.   DDD (degenerative disc disease), cervical    Depression    Dysrhythmia    "tachycardia" (12/11/2011) new onset afib 10/15/14 EKG   E coli bacteremia 06/18/2011   Elevated LDL cholesterol level 12/2018   ESRD (end stage renal disease) (HCC) 06/12/2011   Tues-Thurs-Sat dialysis   Fibromyalgia    Gastroparesis    GERD (gastroesophageal reflux disease)    Glaucoma    right eye   Gout    H/O carpal tunnel syndrome    Headache 10/2019   no current problem per patient on 11/12/22   Headache(784.0)    "not often anymore" (12/11/2011)   Herpes genitalia 1994   History of blood transfusion    "more than a few times" (12/11/2011)   History of stomach ulcers    Hypotension    Iron deficiency anemia    New onset a-fib (HCC)    10/15/14 EKG   Osteopenia    Peripheral neuropathy 11/2018   Pneumonia    x several but years ago   Pressure ulcer of  sacral region, stage 1 07/2019   Seizures (HCC) 1994   "post transplant; only have had that one" (12/11/2011)   Spinal stenosis in cervical region    Stroke [facility])     left basal ganglia lacunar infarct; Right frontal lobe lacunar infarct.   Stroke [facility]) ~ 1999; 2001   "briefly lost my vision; lost my right eye" (12/11/2011)   Vitamin D deficiency 12/2018    Past Surgical History:  Procedure Laterality Date   ANTERIOR CERVICAL DECOMP/DISCECTOMY FUSION N/A 01/08/2015   Procedure: Anterior Cervical Three-Four/Four-Five  Decompression/Diskectomy/Fusion;  Surgeon: Hilda Lias, MD;  Location: [facility] NEURO ORS;  Service: Neurosurgery;  Laterality: N/A;  C3-4 C4-5 Anterior cervical decompression/diskectomy/fusion   APPENDECTOMY  ~ 2004   BIOPSY  09/12/2020   Procedure: BIOPSY;  Surgeon: Rachael Fee, MD;  Location: [facility] ENDOSCOPY;  Service: Endoscopy;;   BRONCHIAL BIOPSY  11/16/2022   Procedure: BRONCHIAL BIOPSIES;  Surgeon: Leslye Peer, MD;  Location: [facility] ENDOSCOPY;  Service: Pulmonary;;   BRONCHIAL BRUSHINGS  11/16/2022   Procedure: BRONCHIAL BRUSHINGS;  Surgeon: Leslye Peer, MD;  Location: [facility] ENDOSCOPY;  Service: Pulmonary;;   BRONCHIAL NEEDLE ASPIRATION BIOPSY  11/16/2022   Procedure: BRONCHIAL NEEDLE ASPIRATION BIOPSIES;  Surgeon: Leslye Peer, MD;  Location: [facility] ENDOSCOPY;  Service: Pulmonary;;   CATARACT EXTRACTION     right eye   COLONOSCOPY     COLONOSCOPY WITH PROPOFOL N/A 09/12/2020   Procedure: COLONOSCOPY WITH PROPOFOL;  Surgeon: Rachael Fee, MD;  Location: [facility] ENDOSCOPY;  Service: Endoscopy;  Laterality: N/A;   ENUCLEATION  2001   "right"   ESOPHAGOGASTRODUODENOSCOPY (EGD) WITH PROPOFOL N/A 04/21/2012   Procedure: ESOPHAGOGASTRODUODENOSCOPY (EGD) WITH PROPOFOL;  Surgeon: Rachael Fee, MD;  Location: [facility] ENDOSCOPY;  Service: Endoscopy;  Laterality: N/A;   ESOPHAGOGASTRODUODENOSCOPY (EGD) WITH PROPOFOL N/A 09/12/2020   Procedure: ESOPHAGOGASTRODUODENOSCOPY (EGD) WITH PROPOFOL;  Surgeon: Rachael Fee, MD;  Location: [facility] ENDOSCOPY;  Service: Endoscopy;  Laterality: N/A;   FIDUCIAL MARKER PLACEMENT  11/16/2022   Procedure: FIDUCIAL MARKER PLACEMENT;  Surgeon: Leslye Peer, MD;  Location: [facility] ENDOSCOPY;  Service: Pulmonary;;   HEMOSTASIS CONTROL  11/16/2022   Procedure: HEMOSTASIS CONTROL;  Surgeon: Leslye Peer, MD;  Location: [facility] ENDOSCOPY;  Service: Pulmonary;;   INSERTION OF DIALYSIS CATHETER  1988   "AV graft LUA & LFA; LUA worked for 1 day; LFA never worked"   IR FLUORO GUIDE  CV LINE RIGHT  07/11/2019   IR FLUORO GUIDE CV LINE RIGHT  10/20/2019   IR FLUORO GUIDE CV LINE RIGHT  05/31/2020   IR PTA VENOUS EXCEPT DIALYSIS CIRCUIT  05/31/2020   IR RADIOLOGY PERIPHERAL GUIDED IV START  07/11/2019   IR US GUIDE VASC ACCESS RIGHT  07/11/2019   IR US GUIDE VASC ACCESS RIGHT  07/11/2019   IR US GUIDE VASC ACCESS RIGHT  07/11/2019   IR VENOCAVAGRAM IVC  05/31/2020   KIDNEY TRANSPLANT  1994; 1999; 2005   "right"   MULTIPLE TOOTH EXTRACTIONS     POLYPECTOMY  09/12/2020   Procedure: POLYPECTOMY;  Surgeon: Rachael Fee, MD;  Location: [facility] ENDOSCOPY;  Service: Endoscopy;;   RIGHT HEART CATH N/A 03/23/2019   Procedure: RIGHT HEART CATH;  Surgeon: Dolores Patty, MD;  Location: [facility] INVASIVE CV LAB;  Service: Cardiovascular;  Laterality: N/A;   TONSILLECTOMY     TOTAL NEPHRECTOMY  1988?; 1994; 2005   VIDEO BRONCHOSCOPY WITH RADIAL ENDOBRONCHIAL ULTRASOUND  11/16/2022   Procedure: VIDEO BRONCHOSCOPY WITH RADIAL ENDOBRONCHIAL ULTRASOUND;  Surgeon: Leslye Peer, MD;  Location: [facility] ENDOSCOPY;  Service: Pulmonary;;    Family History  Problem Relation Age of Onset   Glaucoma Mother    Pancreatic cancer Father    Multiple sclerosis Brother    Diabetes Maternal Uncle    Hypertension Maternal Grandmother    Breast cancer Neg Hx     Social History Social History   Tobacco Use   Smoking status: Every Day    Current packs/day: 1.50    Average packs/day: 1.5 packs/day for 35.0 years (52.5 ttl pk-yrs)    Types: Cigarettes    Passive exposure: Never   Smokeless tobacco: Never   Tobacco comments:    Pack of cigarettes lasts 2 days 09/18/2020    Patient now only smokes 5 cigarettes per day as of 11/12/22  Vaping Use   Vaping status: Never Used  Substance Use Topics   Alcohol use: Not Currently    Comment: rarely   Drug use: Yes    Frequency: 1.0 times per week    Types: Marijuana    Comment: last use was 11/12/22 - informed to refrain 1-2 days prior procedure    Allergies   Allergen Reactions   Levofloxacin Itching and Rash   Tape Other (See Comments)    "Certain surgical tapes peel off my skin"    Current Outpatient Medications  Medication Sig Dispense Refill   allopurinol (ZYLOPRIM) 100 MG tablet Take 1 tablet (100 mg total) by mouth daily. 90 tablet 3   allopurinol (ZYLOPRIM) 100 MG tablet TAKE 1 TABLET BY MOUTH EVERY DAY 90 tablet 3   amiodarone (PACERONE) 200 MG tablet Take 0.5 tablets (100 mg total) by mouth daily. 90 tablet 1   apixaban (ELIQUIS) 2.5 MG TABS tablet Take 1 tablet (2.5 mg total) by mouth 2 (two) times daily. Okay to restart this medication on 11/18/2022     Colchicine 0.6 MG CAPS Take 0.6 mg by mouth daily as needed (gout flare up). 30 capsule 2   cyclobenzaprine (FLEXERIL) 5 MG tablet Take 1 tablet (5 mg total) by mouth at bedtime as needed. 90 tablet 1   diclofenac Sodium (VOLTAREN) 1 % GEL Apply 4 g topically as needed. 50 g 0   diphenhydrAMINE (BENADRYL) 25 mg capsule Take 1 capsule (25 mg total) by mouth every 8 (eight) hours as needed. 30 capsule 6   dorzolamide-timolol (COSOPT) 2-0.5 % ophthalmic solution Place 1 drop into the left eye 2 (two) times daily.     escitalopram (LEXAPRO) 20 MG tablet Take 20 mg by mouth daily.     famotidine (PEPCID) 20 MG tablet TAKE 1 TABLET BY MOUTH TWICE A DAY 180 tablet 3   fluticasone-salmeterol (ADVAIR HFA) 115-21 MCG/ACT inhaler Inhale 2 puffs into the lungs 2 (two) times daily. 1 each 12   gabapentin (NEURONTIN) 100 MG capsule Take 100 mg by mouth 3 (three) times daily.     lidocaine (LIDODERM) 5 % Place 1 patch onto the skin daily as needed (for pain- remove old patch first).      metoCLOPramide (REGLAN) 10 MG tablet Take 1 tablet (10 mg total) by mouth 3 (three) times daily before meals. 90 tablet 2   midodrine (PROAMATINE) 10 MG tablet Take 1.5 tablets (15 mg total) by mouth 3 (three) times daily. NEEDS FOLLOW UP APPOINTMENT FOR ANYMORE REFILLS 405 tablet 0   omeprazole (PRILOSEC) 40 MG  capsule TAKE 1 CAPSULE (40 MG TOTAL) BY MOUTH DAILY. 90 capsule 1   ondansetron (ZOFRAN-ODT) 8  MG disintegrating tablet Take 8 mg by mouth as needed for nausea or vomiting.     oxyCODONE-acetaminophen (PERCOCET) 10-325 MG tablet Take 1 tablet by mouth 4 (four) times daily as needed for pain.     VELPHORO 500 MG chewable tablet Chew 500 mg by mouth 3 (three) times daily.     zolpidem (AMBIEN) 10 MG tablet Take 1 tablet (10 mg total) by mouth at bedtime as needed for sleep. 10 tablet 0   No current facility-administered medications for this visit.    REVIEW OF SYSTEMS:   Review of Systems  Constitutional: Negative for appetite change, chills, fatigue, fever and unexpected weight change.  HENT:   Negative for mouth sores, nosebleeds, sore throat and trouble swallowing.   Eyes: Negative for eye problems and icterus.  Respiratory: Negative for cough, hemoptysis, shortness of breath and wheezing.   Cardiovascular: Negative for chest pain and leg swelling.  Gastrointestinal: Negative for abdominal pain, constipation, diarrhea, nausea and vomiting.  Genitourinary: Negative for bladder incontinence, difficulty urinating, dysuria, frequency and hematuria.   Musculoskeletal: Negative for back pain, gait problem, neck pain and neck stiffness.  Skin: Negative for itching and rash.  Neurological: Negative for dizziness, extremity weakness, gait problem, headaches, light-headedness and seizures.  Hematological: Negative for adenopathy. Does not bruise/bleed easily.  Psychiatric/Behavioral: Negative for confusion, depression and sleep disturbance. The patient is not nervous/anxious.     PHYSICAL EXAMINATION:  There were no vitals taken for this visit.  ECOG PERFORMANCE STATUS: {CHL ONC ECOG [id number]  Physical Exam  Constitutional: Oriented to person, place, and time and well-developed, well-nourished, and in no distress. No distress.  HENT:  Head: Normocephalic and atraumatic.  Mouth/Throat:  Oropharynx is clear and moist. No oropharyngeal exudate.  Eyes: Conjunctivae are normal. Right eye exhibits no discharge. Left eye exhibits no discharge. No scleral icterus.  Neck: Normal range of motion. Neck supple.  Cardiovascular: Normal rate, regular rhythm, normal heart sounds and intact distal pulses.   Pulmonary/Chest: Effort normal and breath sounds normal. No respiratory distress. No wheezes. No rales.  Abdominal: Soft. Bowel sounds are normal. Exhibits no distension and no mass. There is no tenderness.  Musculoskeletal: Normal range of motion. Exhibits no edema.  Lymphadenopathy:    No cervical adenopathy.  Neurological: Alert and oriented to person, place, and time. Exhibits normal muscle tone. Gait normal. Coordination normal.  Skin: Skin is warm and dry. No rash noted. Not diaphoretic. No erythema. No pallor.  Psychiatric: Mood, memory and judgment normal.  Vitals reviewed.  LABORATORY DATA: Lab Results  Component Value Date   WBC 5.5 06/10/2022   HGB 8.2 (L) 11/16/2022   HCT 24.0 (L) 11/16/2022   MCV 93.8 06/10/2022   PLT 194 06/10/2022      Chemistry      Component Value Date/Time   NA 130 (L) 11/16/2022 0636   NA 138 05/08/2019 0854   K 5.3 (H) 11/16/2022 0636   CL 94 (L) 11/16/2022 0636   CO2 26 07/01/2022 1013   BUN 59 (H) 11/16/2022 0636   BUN 82 (HH) 05/08/2019 0854   CREATININE 10.70 (H) 11/16/2022 0636   CREATININE 4.30 (HH) 10/13/2019 0916      Component Value Date/Time   CALCIUM 9.7 07/01/2022 1013   CALCIUM 9.0 04/10/2019 0828   ALKPHOS 140 (H) 07/01/2022 1013   AST 22 07/01/2022 1013   AST 44 (H) 10/13/2019 0916   ALT 15 07/01/2022 1013   ALT 34 10/13/2019 0916   BILITOT 0.7 07/01/2022 1013  BILITOT 0.7 10/13/2019 0916       RADIOGRAPHIC STUDIES: CT Super D Chest Wo Contrast  Result Date: 11/23/2022 CLINICAL DATA:  Pulmonary nodule. EXAM: CT CHEST WITHOUT CONTRAST TECHNIQUE: Multidetector CT imaging of the chest was performed using  thin slice collimation for electromagnetic bronchoscopy planning purposes, without intravenous contrast. RADIATION DOSE REDUCTION: This exam was performed according to the departmental dose-optimization program which includes automated exposure control, adjustment of the mA and/or kV according to patient size and/or use of iterative reconstruction technique. COMPARISON:  PET 10/19/2022, CT chest 09/28/2022 and 09/24/2021. CT abdomen 09/06/2021. MR abdomen 11/11/2022. FINDINGS: Cardiovascular: Atherosclerotic calcification of the aorta with age advanced involvement of the coronary arteries. Markedly enlarged pulmonic trunk. Heart is enlarged. No pericardial effusion. Dilated azygous and hemiazygous veins. Stent in the SVC. Mediastinum/Nodes: Mediastinal lymph nodes measure up to 1.5 cm in the right paratracheal station, similar. Hilar regions are difficult to definitively evaluate without IV contrast. No axillary adenopathy. Esophagus is grossly unremarkable. Lungs/Pleura: Centrilobular emphysema. Numerous tiny bilateral pulmonary nodules, as evaluated on 09/28/2022. Pleuroparenchymal scarring in the lower right hemithorax. Nodular consolidation in the anterior left upper lobe persists, measuring 1.4 x 2.6 cm (4/68), previously approximately 1.4 x 2.2 cm on 09/28/2022. Trace left pleural fluid. Airway is unremarkable. Upper Abdomen: Liver margin is slightly irregular. Increased hepatic parenchymal attenuation, attributable to amiodarone therapy. Visualized portions of the gallbladder, right adrenal gland and spleen are grossly unremarkable. Partially imaged cystic mass in the region of the pancreatic tail measures approximately 3.0 x 4.4 cm. Visualized portions of the stomach and bowel are grossly unremarkable. No upper abdominal adenopathy. Musculoskeletal: Degenerative changes in the spine. Old rib fracture. IMPRESSION: 1. Persistent subpleural consolidation in the anterior left upper lobe, stable to minimally  progressive from 09/28/2022. Surrounding ground-glass, nodularity and septal thickening, as before. Findings are worrisome for malignancy. An indolent infectious process cannot be definitively excluded. These results will be called to the ordering clinician or representative by the Radiologist Assistant, and communication documented in the PACS or [employer]. 2. Mediastinal adenopathy. Recommend continued attention on follow-up. 3. Trace left pleural fluid. 4. Cirrhosis. 5. Partially imaged cystic mass in the region of the pancreatic tail, recently evaluated by MR abdomen 11/11/2022. 6. Age advanced coronary artery calcification. Aortic atherosclerosis (ICD10-I70.0). 7. Enlarged pulmonic trunk, indicative of pulmonary arterial hypertension. 8.  Emphysema (ICD10-J43.9). Electronically Signed   By: Leanna Battles M.D.   On: 11/23/2022 10:49   MR ABDOMEN WWO CONTRAST  Result Date: 11/16/2022 CLINICAL DATA:  Characterize pancreatic cystic lesion incidentally identified by prior CT EXAM: MRI ABDOMEN WITHOUT AND WITH CONTRAST TECHNIQUE: Multiplanar multisequence MR imaging of the abdomen was performed both before and after the administration of intravenous contrast. CONTRAST:  5mL GADAVIST GADOBUTROL 1 MMOL/ML IV SOLN COMPARISON:  CT chest, 09/28/2022, PET-CT, 10/19/2022 FINDINGS: Lower chest: No acute abnormality. Hepatobiliary: Severe hepatic parenchymal signal inversion and intrinsic T2 hypointensity, consistent with iron deposition. Coarse, nodular contour of the liver. No gallstones, gallbladder wall thickening, or biliary dilatation. Pancreas: Unfortunately, evaluation of the pancreatic tail is limited by metallic susceptibility artifact from adjacent surgical clips as well as iron burden in the adjacent splenic parenchyma. Particularly, there is essentially no meaningful assessment of the splenic tail on multiphasic contrast enhanced sequences. Within this limitation, there is a complex, multi septated,  internally heterogeneous cystic lesion arising from the tip of the pancreatic tail measuring 4.4 x 2.3 x 3.3 cm (series 4, image 20, series 3, image 18). No pancreatic ductal dilatation  or surrounding inflammatory changes. Spleen: Severely T2 hypointense splenic parenchyma. Adrenals/Urinary Tract: Adrenal glands are unremarkable. Status post bilateral native nephrectomy. Right lower quadrant renal transplant graft, imaged on select sequences. Multiple small renal cortical cysts, requiring no specific further follow-up or characterization. No hydronephrosis. Stomach/Bowel: Stomach is within normal limits. No evidence of bowel wall thickening, distention, or inflammatory changes. Vascular/Lymphatic: No significant vascular findings are present. No enlarged abdominal lymph nodes. Other: No abdominal wall hernia or abnormality. No ascites. Musculoskeletal: No acute or significant osseous findings. Diffusely heterogeneous bone marrow. IMPRESSION: 1. Unfortunately, evaluation of the pancreatic tail is limited by metallic susceptibility artifact from adjacent surgical clips as well as iron burden in the adjacent splenic parenchyma. Particularly, there is essentially no meaningful assessment of the splenic tail on multiphasic contrast enhanced sequences. Within this limitation, there is a complex, multi septated, internally heterogeneous cystic lesion arising from the tip of the pancreatic tail measuring 4.4 x 2.3 x 3.3 cm. Although contrast enhancement can not be confirmed due to technical limitations of today's examination, appearance is concerning for a partially cystic malignancy with a significant solid component. Consider tissue sampling. Given technical limitations of today's MR examination, would recommend multiphasic contrast enhanced CT for future assessment. 2. Severe hepatic and splenic iron deposition. 3. Coarse, nodular contour of the liver, consistent with cirrhosis. 4. Status post bilateral native  nephrectomy. Right lower quadrant renal transplant graft, imaged on selected sequences. No hydronephrosis. 5. Diffusely heterogeneous bone marrow, which may reflect marrow reconversion and renal osteodystrophy. Electronically Signed   By: Jearld Lesch M.D.   On: 11/16/2022 21:49   MR 3D Recon At Scanner  Result Date: 11/16/2022 CLINICAL DATA:  Characterize pancreatic cystic lesion incidentally identified by prior CT EXAM: MRI ABDOMEN WITHOUT AND WITH CONTRAST TECHNIQUE: Multiplanar multisequence MR imaging of the abdomen was performed both before and after the administration of intravenous contrast. CONTRAST:  5mL GADAVIST GADOBUTROL 1 MMOL/ML IV SOLN COMPARISON:  CT chest, 09/28/2022, PET-CT, 10/19/2022 FINDINGS: Lower chest: No acute abnormality. Hepatobiliary: Severe hepatic parenchymal signal inversion and intrinsic T2 hypointensity, consistent with iron deposition. Coarse, nodular contour of the liver. No gallstones, gallbladder wall thickening, or biliary dilatation. Pancreas: Unfortunately, evaluation of the pancreatic tail is limited by metallic susceptibility artifact from adjacent surgical clips as well as iron burden in the adjacent splenic parenchyma. Particularly, there is essentially no meaningful assessment of the splenic tail on multiphasic contrast enhanced sequences. Within this limitation, there is a complex, multi septated, internally heterogeneous cystic lesion arising from the tip of the pancreatic tail measuring 4.4 x 2.3 x 3.3 cm (series 4, image 20, series 3, image 18). No pancreatic ductal dilatation or surrounding inflammatory changes. Spleen: Severely T2 hypointense splenic parenchyma. Adrenals/Urinary Tract: Adrenal glands are unremarkable. Status post bilateral native nephrectomy. Right lower quadrant renal transplant graft, imaged on select sequences. Multiple small renal cortical cysts, requiring no specific further follow-up or characterization. No hydronephrosis.  Stomach/Bowel: Stomach is within normal limits. No evidence of bowel wall thickening, distention, or inflammatory changes. Vascular/Lymphatic: No significant vascular findings are present. No enlarged abdominal lymph nodes. Other: No abdominal wall hernia or abnormality. No ascites. Musculoskeletal: No acute or significant osseous findings. Diffusely heterogeneous bone marrow. IMPRESSION: 1. Unfortunately, evaluation of the pancreatic tail is limited by metallic susceptibility artifact from adjacent surgical clips as well as iron burden in the adjacent splenic parenchyma. Particularly, there is essentially no meaningful assessment of the splenic tail on multiphasic contrast enhanced sequences. Within this limitation, there is a  complex, multi septated, internally heterogeneous cystic lesion arising from the tip of the pancreatic tail measuring 4.4 x 2.3 x 3.3 cm. Although contrast enhancement can not be confirmed due to technical limitations of today's examination, appearance is concerning for a partially cystic malignancy with a significant solid component. Consider tissue sampling. Given technical limitations of today's MR examination, would recommend multiphasic contrast enhanced CT for future assessment. 2. Severe hepatic and splenic iron deposition. 3. Coarse, nodular contour of the liver, consistent with cirrhosis. 4. Status post bilateral native nephrectomy. Right lower quadrant renal transplant graft, imaged on selected sequences. No hydronephrosis. 5. Diffusely heterogeneous bone marrow, which may reflect marrow reconversion and renal osteodystrophy. Electronically Signed   By: Jearld Lesch M.D.   On: 11/16/2022 21:49   DG Chest Port 1 View  Result Date: 11/16/2022 CLINICAL DATA:  Status post bronchoscopy with biopsy. EXAM: PORTABLE CHEST 1 VIEW COMPARISON:  Chest radiographs 06/10/2022, 09/06/2021; CT chest 11/11/2022; PET-CT 10/19/2022 FINDINGS: There is a stent again visualized within the central  superior vena cava. Cardiac silhouette is again mildly enlarged. Mediastinal contours are unchanged from prior and grossly within normal limits for AP technique. There is new moderate bilateral interstitial thickening. New surgical clip overlying the mid upper left lung in the region of the recently biopsied hypermetabolic pulmonary nodule seen on prior PET-CT. No definite pleural effusion or pneumothorax. No acute skeletal abnormality. IMPRESSION: 1. Interval left lung biopsy with associated marker. No pneumothorax. 2. Stent again visualized within the central superior vena cava. Electronically Signed   By: Neita Garnet M.D.   On: 11/16/2022 10:49   DG C-ARM BRONCHOSCOPY  Result Date: 11/16/2022 C-ARM BRONCHOSCOPY: Fluoroscopy was utilized by the requesting physician.  No radiographic interpretation.    ASSESSMENT: This is a very pleasant 56 year old African American Female with stage ***. ***. She presented with a 2.6 cm subpleural nodule in the LUL. ***see if Dr. Arbutus Ped includes the pre-vascular LN***. She was diagnosed in October 2024. Her molecular studies show ***.   The patient was seen with Dr. Arbutus Ped today. Dr. Arbutus Ped had a lenghtly discussion about her current condition. Given her comorbidities, she likely is not a surgical canidate. Dr. Arbutus Ped recommends radiation. I will place the referral.   We will arrange for a restaging CT scan in about 4 months from now to assess her treatment response to radiation.   We will see her back about 1 week after the CT scan to review the results.   The patient was advised to call immediately if she has any concerning symptoms in the interval. The patient voices understanding of current disease status and treatment options and is in agreement with the current care plan. All questions were answered. The patient knows to call the clinic with any problems, questions or concerns. We can certainly see the patient much sooner if necessary   The patient  voices understanding of current disease status and treatment options and is in agreement with the current care plan.  All questions were answered. The patient knows to call the clinic with any problems, questions or concerns. We can certainly see the patient much sooner if necessary.  Thank you so much for allowing me to participate in the care of Tina Watkins. I will continue to follow up the patient with you and assist in her care.  I spent {CHL ONC TIME VISIT - OACZY:[id number]} counseling the patient face to face. The total time spent in the appointment was {CHL ONC TIME VISIT -  ACZYS:[id number]}.  Disclaimer: This note was dictated with voice recognition software. Similar sounding words can inadvertently be transcribed and may not be corrected upon review.   Tina Watkins L Svetlana Bagby December 05, 2022, 3:24 PM

## 2022-12-07 ENCOUNTER — Other Ambulatory Visit: Payer: Self-pay | Admitting: Physician Assistant

## 2022-12-07 ENCOUNTER — Inpatient Hospital Stay: Payer: Medicare Other

## 2022-12-07 ENCOUNTER — Inpatient Hospital Stay: Payer: Medicare Other | Admitting: Physician Assistant

## 2022-12-07 DIAGNOSIS — C3490 Malignant neoplasm of unspecified part of unspecified bronchus or lung: Secondary | ICD-10-CM

## 2022-12-09 ENCOUNTER — Ambulatory Visit: Payer: Medicare Other | Attending: Family Medicine

## 2022-12-09 DIAGNOSIS — M6281 Muscle weakness (generalized): Secondary | ICD-10-CM | POA: Insufficient documentation

## 2022-12-09 DIAGNOSIS — G8929 Other chronic pain: Secondary | ICD-10-CM | POA: Insufficient documentation

## 2022-12-09 DIAGNOSIS — M25511 Pain in right shoulder: Secondary | ICD-10-CM | POA: Diagnosis present

## 2022-12-09 DIAGNOSIS — M25512 Pain in left shoulder: Secondary | ICD-10-CM | POA: Insufficient documentation

## 2022-12-09 NOTE — Therapy (Signed)
OUTPATIENT PHYSICAL THERAPY TREATMENT NOTE    Patient Name: Tina Watkins MRN: [id number] DOB:Mar 20, 1965, 57 y.o., female Today's Date: 12/09/2022   END OF SESSION:  PT End of Session - 12/09/22 1052     Visit Number 13    Number of Visits 16    Date for PT Re-Evaluation 12/09/22    Authorization Type MCR / MCD    Progress Note Due on Visit 10    PT Start Time 1052    PT Stop Time 1140    PT Time Calculation (min) 48 min    Activity Tolerance Patient tolerated treatment well;Patient limited by pain    Behavior During Therapy [facility] for tasks assessed/performed                Past Medical History:  Diagnosis Date   Anxiety    Bacteremia due to Gram-negative bacteria 05/23/2011   Blind    right eye   Blind right eye    CHF (congestive heart failure) (HCC)    Chronic lower back pain    Complication of anesthesia    "woke up during OR; I have an extremely high tolerance" (12/11/2011) 1 procedure was graft; the other procedures were procedures that are typically done with sedation.   DDD (degenerative disc disease), cervical    Depression    Dysrhythmia    "tachycardia" (12/11/2011) new onset afib 10/15/14 EKG   E coli bacteremia 06/18/2011   Elevated LDL cholesterol level 12/2018   ESRD (end stage renal disease) (HCC) 06/12/2011   Tues-Thurs-Sat dialysis   Fibromyalgia    Gastroparesis    GERD (gastroesophageal reflux disease)    Glaucoma    right eye   Gout    H/O carpal tunnel syndrome    Headache 10/2019   no current problem per patient on 11/12/22   Headache(784.0)    "not often anymore" (12/11/2011)   Herpes genitalia 1994   History of blood transfusion    "more than a few times" (12/11/2011)   History of stomach ulcers    Hypotension    Iron deficiency anemia    New onset a-fib (HCC)    10/15/14 EKG   Osteopenia    Peripheral neuropathy 11/2018   Pneumonia    x several but years ago   Pressure ulcer of sacral region, stage 1 07/2019   Seizures  (HCC) 1994   "post transplant; only have had that one" (12/11/2011)   Spinal stenosis in cervical region    Stroke [facility])     left basal ganglia lacunar infarct; Right frontal lobe lacunar infarct.   Stroke [facility]) ~ 1999; 2001   "briefly lost my vision; lost my right eye" (12/11/2011)   Vitamin D deficiency 12/2018   Past Surgical History:  Procedure Laterality Date   ANTERIOR CERVICAL DECOMP/DISCECTOMY FUSION N/A 01/08/2015   Procedure: Anterior Cervical Three-Four/Four-Five Decompression/Diskectomy/Fusion;  Surgeon: Hilda Lias, MD;  Location: [facility] NEURO ORS;  Service: Neurosurgery;  Laterality: N/A;  C3-4 C4-5 Anterior cervical decompression/diskectomy/fusion   APPENDECTOMY  ~ 2004   BIOPSY  09/12/2020   Procedure: BIOPSY;  Surgeon: Rachael Fee, MD;  Location: [facility] ENDOSCOPY;  Service: Endoscopy;;   BRONCHIAL BIOPSY  11/16/2022   Procedure: BRONCHIAL BIOPSIES;  Surgeon: Leslye Peer, MD;  Location: [facility] ENDOSCOPY;  Service: Pulmonary;;   BRONCHIAL BRUSHINGS  11/16/2022   Procedure: BRONCHIAL BRUSHINGS;  Surgeon: Leslye Peer, MD;  Location: [facility] ENDOSCOPY;  Service: Pulmonary;;   BRONCHIAL NEEDLE ASPIRATION BIOPSY  11/16/2022   Procedure:  BRONCHIAL NEEDLE ASPIRATION BIOPSIES;  Surgeon: Leslye Peer, MD;  Location: [facility] ENDOSCOPY;  Service: Pulmonary;;   CATARACT EXTRACTION     right eye   COLONOSCOPY     COLONOSCOPY WITH PROPOFOL N/A 09/12/2020   Procedure: COLONOSCOPY WITH PROPOFOL;  Surgeon: Rachael Fee, MD;  Location: [facility] ENDOSCOPY;  Service: Endoscopy;  Laterality: N/A;   ENUCLEATION  2001   "right"   ESOPHAGOGASTRODUODENOSCOPY (EGD) WITH PROPOFOL N/A 04/21/2012   Procedure: ESOPHAGOGASTRODUODENOSCOPY (EGD) WITH PROPOFOL;  Surgeon: Rachael Fee, MD;  Location: [facility] ENDOSCOPY;  Service: Endoscopy;  Laterality: N/A;   ESOPHAGOGASTRODUODENOSCOPY (EGD) WITH PROPOFOL N/A 09/12/2020   Procedure: ESOPHAGOGASTRODUODENOSCOPY (EGD) WITH PROPOFOL;  Surgeon: Rachael Fee, MD;   Location: [facility] ENDOSCOPY;  Service: Endoscopy;  Laterality: N/A;   FIDUCIAL MARKER PLACEMENT  11/16/2022   Procedure: FIDUCIAL MARKER PLACEMENT;  Surgeon: Leslye Peer, MD;  Location: [facility] ENDOSCOPY;  Service: Pulmonary;;   HEMOSTASIS CONTROL  11/16/2022   Procedure: HEMOSTASIS CONTROL;  Surgeon: Leslye Peer, MD;  Location: [facility] ENDOSCOPY;  Service: Pulmonary;;   INSERTION OF DIALYSIS CATHETER  1988   "AV graft LUA & LFA; LUA worked for 1 day; LFA never worked"   IR FLUORO GUIDE CV LINE RIGHT  07/11/2019   IR FLUORO GUIDE CV LINE RIGHT  10/20/2019   IR FLUORO GUIDE CV LINE RIGHT  05/31/2020   IR PTA VENOUS EXCEPT DIALYSIS CIRCUIT  05/31/2020   IR RADIOLOGY PERIPHERAL GUIDED IV START  07/11/2019   IR US GUIDE VASC ACCESS RIGHT  07/11/2019   IR US GUIDE VASC ACCESS RIGHT  07/11/2019   IR US GUIDE VASC ACCESS RIGHT  07/11/2019   IR VENOCAVAGRAM IVC  05/31/2020   KIDNEY TRANSPLANT  1994; 1999; 2005   "right"   MULTIPLE TOOTH EXTRACTIONS     POLYPECTOMY  09/12/2020   Procedure: POLYPECTOMY;  Surgeon: Rachael Fee, MD;  Location: [facility] ENDOSCOPY;  Service: Endoscopy;;   RIGHT HEART CATH N/A 03/23/2019   Procedure: RIGHT HEART CATH;  Surgeon: Dolores Patty, MD;  Location: [facility] INVASIVE CV LAB;  Service: Cardiovascular;  Laterality: N/A;   TONSILLECTOMY     TOTAL NEPHRECTOMY  1988?; 1994; 2005   VIDEO BRONCHOSCOPY WITH RADIAL ENDOBRONCHIAL ULTRASOUND  11/16/2022   Procedure: VIDEO BRONCHOSCOPY WITH RADIAL ENDOBRONCHIAL ULTRASOUND;  Surgeon: Leslye Peer, MD;  Location: [facility] ENDOSCOPY;  Service: Pulmonary;;   Patient Active Problem List   Diagnosis Date Noted   Pulmonary nodule 1 cm or greater in diameter 10/28/2022   COVID-19 10/02/2022   Encounter for monitoring amiodarone therapy 07/01/2022   Atrial fibrillation (HCC) 07/01/2022   Bilateral hip pain 01/14/2022   Prolonged Q-T interval on ECG 09/07/2021   Nausea & vomiting 09/06/2021   Bilateral shoulder pain 06/06/2021   Bilateral hand  swelling 06/06/2021   Cervical radiculitis 01/24/2021   Acute respiratory failure with hypoxia (HCC) 10/23/2020   Coagulation defect, unspecified (HCC) 08/01/2020   Aortic atherosclerosis (HCC) 07/10/2020   Hypercoagulable state due to atrial fibrillation (HCC) 04/23/2020   Atrial flutter (HCC) 03/29/2020   Chronic pain of both knees 03/13/2020   Chills (without fever) 12/12/2019   Other pancytopenia (HCC)    Decubitus ulcer of sacral region, stage 1 08/18/2019   HCAP (healthcare-associated pneumonia) 08/05/2019   Pressure injury of skin 07/24/2019   Chest congestion 07/24/2019   Itching 07/24/2019   Weakness 06/13/2019   Pneumonia due to COVID-19 virus 05/09/2019   Hypotension 05/09/2019   Symptomatic anemia 05/09/2019   CHF (  congestive heart failure) (HCC) 05/09/2019   Swollen abdomen 01/04/2019   DDD (degenerative disc disease), cervical 12/06/2018   Neuropathy 12/06/2018   Paresthesia 10/11/2018   Neck pain 10/11/2018   Tobacco abuse 11/20/2017   Right leg swelling 11/20/2017   Right leg pain 11/20/2017   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   GERD (gastroesophageal reflux disease) 11/20/2017   Acute venous embolism and thrombosis of deep vessels of proximal lower extremity, right (HCC) 11/20/2017   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    Thrush, oral    AKI (acute kidney injury) (HCC)    Multifocal pneumonia 08/09/2017   ETD (Eustachian tube dysfunction), bilateral 08/03/2017   Acute recurrent pansinusitis 07/21/2017   Chest pain 09/29/2015   Cervical stenosis of spinal canal 01/08/2015   Urinary tract infectious disease    Muscle spasms of neck 06/15/2014   Bleeding hemorrhoid 06/15/2014   Anemia in chronic kidney disease 06/15/2014   Pyelonephritis, acute 06/13/2014   Sepsis (HCC) 06/13/2014   History of kidney transplant    Abnormal CT scan 09/14/2012   Chronic pain syndrome 04/09/2012   Dehydration, mild 04/09/2012    Herpes infection 06/23/2011   Anxiety 06/23/2011   E coli bacteremia 06/18/2011   ESRD (end stage renal disease) (HCC) 06/12/2011   Lower urinary tract infectious disease 06/12/2011   Bacteremia due to Gram-negative bacteria 05/23/2011   History of renal transplantation 05/22/2011   Septic shock (HCC) 05/22/2011   Acute on chronic kidney failure (HCC) 05/22/2011   Gastroparesis 04/24/2008   WEIGHT LOSS 08/24/2007   NAUSEA AND VOMITING 08/24/2007    PCP: Ivonne Andrew, NP  REFERRING PROVIDER: Unknown Jim, MD  REFERRING DIAG: Bil adhesive capsulitis   THERAPY DIAG:  Chronic pain of both shoulders  Muscle weakness (generalized)  Rationale for Evaluation and Treatment: Rehabilitation  ONSET DATE: Chronic, ongoing for years   SUBJECTIVE:                                                                                                                                                                                     SUBJECTIVE STATEMENT: Patient reports that her shoulder are hurting some today, but that the last few days it hasn't been too bad.  PAIN:  Are you having pain? Yes:  NPRS scale: 6/10 Pain location: Bilateral shoulders Pain description: constant aching Aggravating factors: Cooking, washing dishes, grooming, reaching into cabinet Relieving factors: Rest  PERTINENT HISTORY: History of stroke, DVT, CHF, Afib, Neuropathy, neck pain with ACDF C3-5 in 2016, gout, DDD, ESRD, cervical stenosis, dialysis, kidney transplant (failed), see complex medical history above  PRECAUTIONS: See pertinent medical  history  WEIGHT BEARING RESTRICTIONS: No  PATIENT GOALS: Improve shoulder pain and ability to reach overhead, put hair in ponytail   OBJECTIVE:  PATIENT SURVEYS:  FOTO 56 % functional status  09/09/2022: 53% 10/14/2022: 55%  POSTURE: Rounded shoulder and forward head posture  UPPER EXTREMITY ROM:   ROM Right eval Left eval Rt / Lt 08/12/2022 Rt /  Lt 08/17/2022 R/L 8/7 Rt / Lt 10/14/2022  Shoulder flexion 90 100 105 / 100 95 / 100 90/115 105 / 118  Shoulder extension        Shoulder abduction 65 90      Shoulder adduction        Shoulder internal rotation Reach to lateral iliac crest Reach to PSIS      Shoulder external rotation Unable to reach to head Reach to occiput    Reach to occiput but with significant pain  Elbow flexion        Elbow extension        Wrist flexion        Wrist extension        Wrist ulnar deviation        Wrist radial deviation        Wrist pronation        Wrist supination        (Blank rows = not tested)  UPPER EXTREMITY MMT:    Strength assessment within available range  MMT Right eval Left eval  Shoulder flexion 3 3  Shoulder extension    Shoulder abduction 3 3  Shoulder adduction    Shoulder internal rotation    Shoulder external rotation    Middle trapezius    Lower trapezius    Elbow flexion    Elbow extension    Wrist flexion    Wrist extension    Wrist ulnar deviation    Wrist radial deviation    Wrist pronation    Wrist supination    Grip strength (lbs)    (Blank rows = not tested)  JOINT MOBILITY TESTING:  Not assessed  PALPATION:  Generalized tenderness of bilateral shoulders    TODAY'S TREATMENT:   [facility] Adult PT Treatment:                                                DATE: 12/09/22 Therapeutic Exercise: Nustep L4 x 8 min with UE/LE while taking subjective, working on shoulder motion Rows RTB 2x10 Shoulder extension RTB 2x10 Finger ladder with hold at top for stretch x5 BIL Reclined table: Dowel AAROM ER/IR 2x10 BIL ER/IR x10 each BIL clinician resistance YTB Shoulder flexion with dowel small range 2x10 500g ball shoulder flexion x10 BIL Modalities: MHP to BIL shoulder x10 mins post session   [facility] Adult PT Treatment:                                                DATE: 11/25/22 Therapeutic Exercise: Nustep L4 x 8 min with UE/LE while taking subjective,  working on shoulder motion Rows RTB 2x10 Shoulder extension RTB 2x10 Finger ladder with hold at top for stretch x5 BIL Reclined table: Dowel AAROM ER/IR 2x10 BIL ER/IR x10 each BIL clinician resistance YTB Shoulder flexion with dowel small range 2x10  Modalities: MHP to BIL shoulder x10 mins post session   [facility] Adult PT Treatment:                                                DATE: 11/18/22 Therapeutic Exercise: Nustep L4 x 8 min with UE/LE while taking subjective, working on shoulder motion Rows RTB 2x10 Shoulder extension RTB 2x10 Reclined table: Dowel AAROM ER/IR x10 BIL ER/IR x10 each BIL clinician resistance RTB Shoulder flexion with dowel small range 2x10 Modalities: MHP to Lt shoulder x10 mins post session       PATIENT EDUCATION: Education details: POC extension, HEP Person educated: Patient Education method: Programmer, multimedia, Demonstration, Tactile cues, Verbal cues Education comprehension: verbalized understanding, returned demonstration, verbal cues required, tactile cues required, and needs further education  HOME EXERCISE PROGRAM: Access Code: [email or web address]    ASSESSMENT: CLINICAL IMPRESSION: Patient presents to PT reporting moderate BIL shoulder pain and that her pain was lower the past couple days. She is interested in continuing PT, states that she feels like it has been helpful thus far. Session today continued to focus on RTC and periscapular strengthening as able within her pain tolerance. Patient continues to benefit from skilled PT services and should be progressed as able to improve functional independence.    OBJECTIVE IMPAIRMENTS: decreased activity tolerance, decreased ROM, decreased strength, postural dysfunction, and pain.   ACTIVITY LIMITATIONS: carrying, lifting, bathing, dressing, reach over head, and hygiene/grooming  PARTICIPATION LIMITATIONS: meal prep, cleaning, laundry, shopping, and community activity  PERSONAL FACTORS: Fitness, Past/current  experiences, Social background, Time since onset of injury/illness/exacerbation, and 3+ comorbidities: see pertinent medical history above  are also affecting patient's functional outcome.    GOALS: Goals reviewed with patient? Yes  SHORT TERM GOALS: Target date: 08/03/2022  Patient will be I with initial HEP in order to progress with therapy. Baseline: HEP provided at eval 08/12/2022: progressing 10/14/2022: independent Goal status: MET  2.  Patient will report bilateral shoulder pain </= 3/10 in order to reduce functional limitations Baseline: patient reports shoulder pain 6/10 08/12/2022: patient reports shoulder pain 3/10 Goal status: MET  LONG TERM GOALS: Target date: 12/09/2022  Patient will be I with final HEP to maintain progress from PT. Baseline: HEP provided at eval 8/7: independent 10/14/2022: progressing Goal status: ONGOING  2.  Patient will report >/= 63% status on FOTO to indicate improved functional ability. Baseline: 56% functional status 8/7: 53 10/14/2022: 55% Goal status: ONGOING  3.  Patient will demonstrate bilateral shoulder AROM flexion >/= 110 deg in order to improve reach into low shelf Baseline: shoulder AROM flexion 90 deg 8/7: R: 90, L 115 10/14/2022: Right 105, Left 118 Goal status: ONGOING  4.  Patient will be able to reach to occiput with right arm to demonstrate improved functional ER and ability to put her hair in a ponytail Baseline: patient unable to perform due to pain and limited motion 8/7: Top of head with significant pain 10/14/2022: able to reach occiput but with significant pain Goal status: PARTIALLY MET   PLAN: PT FREQUENCY: 1x/week  PT DURATION: 8 weeks  PLANNED INTERVENTIONS: Therapeutic exercises, Therapeutic activity, Neuromuscular re-education, Balance training, Gait training, Patient/Family education, Self Care, Joint mobilization, Aquatic Therapy, Dry Needling, Cryotherapy, Moist heat, Manual therapy, and  Re-evaluation  PLAN FOR NEXT SESSION: Review HEP and progress PRN, manual/stretching for bilateral shoulders as tolerated, progressive  strengthening and ROM exercises   Berta Minor PTA 12/09/22  11:32 AM Phone: [phone] Fax: [phone]

## 2022-12-13 NOTE — Progress Notes (Unsigned)
@Patient  ID: Tina Watkins, female    DOB: 04-24-1965, 57 y.o.   MRN: [id number]  No chief complaint on file.   Referring provider: Ivonne Andrew, NP  HPI: 57 year old female. Current smoker. Patient of Dr. Delton Coombes, following for lung nodule   Pulmonary nodule 1 cm or greater in diameter We reviewed your CT scans of the chest and your PET scan today. We will arrange for a navigational bronchoscopy to be done under general anesthesia as an outpatient at [facility] endoscopy.  We will try to get this scheduled for 11/16/2022.  You will need a designated driver and someone to watch you at home on that day after the procedure.  You will need to stop your Eliquis 2 days prior. We will arrange for repeat CT scan of your chest in preparation for the procedure We will arrange for an MRI of the abdomen with contrast Continue your other medications as you have been taking them Follow 1 month in our office   12/14/2022- Interim hx  Patient presents today for 1 month follow-up/pulmonary nodule. Super D CT chest on 11/23/22 showed persistent subpleural consolidation in the anterior left upper lobe, stable to minimally progressive from 09/28/2022.   Patient underwent bronchoscopy with Dr. Delton Coombes which was positive for malignancy. She has been referred to medical oncology with [facility].      Allergies  Allergen Reactions   Levofloxacin Itching and Rash   Tape Other (See Comments)    "Certain surgical tapes peel off my skin"    Immunization History  Administered Date(s) Administered   Hepatitis B, PED/ADOLESCENT 07/20/2019, 08/29/2019, 09/21/2019   Hepb-cpg 07/20/2019, 08/29/2019, 09/21/2019, 11/23/2019, 08/13/2022   Influenza Split 11/07/2019   Influenza, Quadrivalent, Recombinant, Inj, Pf 11/11/2021   Influenza,inj,Quad PF,6+ Mos 11/07/2019, 11/07/2020   PFIZER Comirnaty(Gray Top)Covid-19 Tri-Sucrose Vaccine 07/29/2020   PFIZER(Purple Top)SARS-COV-2 Vaccination 05/31/2019, 06/21/2019,  10/26/2019   PNEUMOCOCCAL CONJUGATE-20 06/04/2022   PPD Test 08/10/2017   Pfizer Covid-19 Vaccine Bivalent Booster 40yrs & up 02/06/2021    Past Medical History:  Diagnosis Date   Anxiety    Bacteremia due to Gram-negative bacteria 05/23/2011   Blind    right eye   Blind right eye    CHF (congestive heart failure) (HCC)    Chronic lower back pain    Complication of anesthesia    "woke up during OR; I have an extremely high tolerance" (12/11/2011) 1 procedure was graft; the other procedures were procedures that are typically done with sedation.   DDD (degenerative disc disease), cervical    Depression    Dysrhythmia    "tachycardia" (12/11/2011) new onset afib 10/15/14 EKG   E coli bacteremia 06/18/2011   Elevated LDL cholesterol level 12/2018   ESRD (end stage renal disease) (HCC) 06/12/2011   Tues-Thurs-Sat dialysis   Fibromyalgia    Gastroparesis    GERD (gastroesophageal reflux disease)    Glaucoma    right eye   Gout    H/O carpal tunnel syndrome    Headache 10/2019   no current problem per patient on 11/12/22   Headache(784.0)    "not often anymore" (12/11/2011)   Herpes genitalia 1994   History of blood transfusion    "more than a few times" (12/11/2011)   History of stomach ulcers    Hypotension    Iron deficiency anemia    New onset a-fib (HCC)    10/15/14 EKG   Osteopenia    Peripheral neuropathy 11/2018   Pneumonia  x several but years ago   Pressure ulcer of sacral region, stage 1 07/2019   Seizures (HCC) 1994   "post transplant; 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   Stroke [facility]) ~ 1999; 2001   "briefly lost my vision; lost my right eye" (12/11/2011)   Vitamin D deficiency 12/2018    Tobacco History: Social History   Tobacco Use  Smoking Status Every Day   Current packs/day: 1.50   Average packs/day: 1.5 packs/day for  35.0 years (52.5 ttl pk-yrs)   Types: Cigarettes   Passive exposure: Never  Smokeless Tobacco Never  Tobacco Comments   Pack of cigarettes lasts 2 days 09/18/2020   Patient now only smokes 5 cigarettes per day as of 11/12/22   Ready to quit: Not Answered Counseling given: Not Answered Tobacco comments: Pack of cigarettes lasts 2 days 09/18/2020 Patient now only smokes 5 cigarettes per day as of 11/12/22   Outpatient Medications Prior to Visit  Medication Sig Dispense Refill   allopurinol (ZYLOPRIM) 100 MG tablet Take 1 tablet (100 mg total) by mouth daily. 90 tablet 3   allopurinol (ZYLOPRIM) 100 MG tablet TAKE 1 TABLET BY MOUTH EVERY DAY 90 tablet 3   amiodarone (PACERONE) 200 MG tablet Take 0.5 tablets (100 mg total) by mouth daily. 90 tablet 1   apixaban (ELIQUIS) 2.5 MG TABS tablet Take 1 tablet (2.5 mg total) by mouth 2 (two) times daily. Okay to restart this medication on 11/18/2022     Colchicine 0.6 MG CAPS Take 0.6 mg by mouth daily as needed (gout flare up). 30 capsule 2   cyclobenzaprine (FLEXERIL) 5 MG tablet Take 1 tablet (5 mg total) by mouth at bedtime as needed. 90 tablet 1   diclofenac Sodium (VOLTAREN) 1 % GEL Apply 4 g topically as needed. 50 g 0   diphenhydrAMINE (BENADRYL) 25 mg capsule Take 1 capsule (25 mg total) by mouth every 8 (eight) hours as needed. 30 capsule 6   dorzolamide-timolol (COSOPT) 2-0.5 % ophthalmic solution Place 1 drop into the left eye 2 (two) times daily.     escitalopram (LEXAPRO) 20 MG tablet Take 20 mg by mouth daily.     famotidine (PEPCID) 20 MG tablet TAKE 1 TABLET BY MOUTH TWICE A DAY 180 tablet 3   fluticasone-salmeterol (ADVAIR HFA) 115-21 MCG/ACT inhaler Inhale 2 puffs into the lungs 2 (two) times daily. 1 each 12   gabapentin (NEURONTIN) 100 MG capsule Take 100 mg by mouth 3 (three) times daily.     lidocaine (LIDODERM) 5 % Place 1 patch onto the skin daily as needed (for pain- remove old patch first).      metoCLOPramide (REGLAN)  10 MG tablet Take 1 tablet (10 mg total) by mouth 3 (three) times daily before meals. 90 tablet 2   midodrine (PROAMATINE) 10 MG tablet Take 1.5 tablets (15 mg total) by mouth 3 (three) times daily. NEEDS FOLLOW UP APPOINTMENT FOR ANYMORE REFILLS 405 tablet 0   omeprazole (PRILOSEC) 40 MG capsule TAKE 1 CAPSULE (40 MG TOTAL) BY MOUTH DAILY. 90 capsule 1   ondansetron (ZOFRAN-ODT) 8 MG disintegrating tablet Take 8 mg by mouth as needed for nausea or vomiting.     oxyCODONE-acetaminophen (PERCOCET) 10-325 MG tablet Take 1 tablet by mouth 4 (four) times daily as needed for pain.     VELPHORO 500 MG  chewable tablet Chew 500 mg by mouth 3 (three) times daily.     zolpidem (AMBIEN) 10 MG tablet Take 1 tablet (10 mg total) by mouth at bedtime as needed for sleep. 10 tablet 0   No facility-administered medications prior to visit.      Review of Systems  Review of Systems   Physical Exam  There were no vitals taken for this visit. Physical Exam   Lab Results:  CBC    Component Value Date/Time   WBC 5.5 06/10/2022 1805   RBC 3.23 (L) 06/10/2022 1805   HGB 8.2 (L) 11/16/2022 0636   HGB 10.1 (L) 10/13/2019 0916   HGB 6.7 (LL) 05/08/2019 0854   HCT 24.0 (L) 11/16/2022 0636   HCT 25.7 (L) 07/08/2019 1446   PLT 194 06/10/2022 1805   PLT 170 10/13/2019 0916   PLT 170 05/08/2019 0854   MCV 93.8 06/10/2022 1805   MCV 98 (H) 05/08/2019 0854   MCH 28.2 06/10/2022 1805   MCHC 30.0 06/10/2022 1805   RDW 20.8 (H) 06/10/2022 1805   RDW 21.9 (H) 05/08/2019 0854   LYMPHSABS 0.9 03/30/2021 0915   MONOABS 0.3 03/30/2021 0915   EOSABS 0.1 03/30/2021 0915   BASOSABS 0.0 03/30/2021 0915    BMET    Component Value Date/Time   NA 130 (L) 11/16/2022 0636   NA 138 05/08/2019 0854   K 5.3 (H) 11/16/2022 0636   CL 94 (L) 11/16/2022 0636   CO2 26 07/01/2022 1013   GLUCOSE 85 11/16/2022 0636   BUN 59 (H) 11/16/2022 0636   BUN 82 (HH) 05/08/2019 0854   CREATININE 10.70 (H) 11/16/2022 0636    CREATININE 4.30 (HH) 10/13/2019 0916   CALCIUM 9.7 07/01/2022 1013   CALCIUM 9.0 04/10/2019 0828   GFRNONAA 9 (L) 07/01/2022 1013   GFRNONAA 11 (L) 10/13/2019 0916   GFRAA 13 (L) 10/13/2019 0916    BNP    Component Value Date/Time   BNP 474.1 (H) 07/07/2019 1033    ProBNP No results found for: "PROBNP"  Imaging: DG Chest Port 1 View  Result Date: 11/16/2022 CLINICAL DATA:  Status post bronchoscopy with biopsy. EXAM: PORTABLE CHEST 1 VIEW COMPARISON:  Chest radiographs 06/10/2022, 09/06/2021; CT chest 11/11/2022; PET-CT 10/19/2022 FINDINGS: There is a stent again visualized within the central superior vena cava. Cardiac silhouette is again mildly enlarged. Mediastinal contours are unchanged from prior and grossly within normal limits for AP technique. There is new moderate bilateral interstitial thickening. New surgical clip overlying the mid upper left lung in the region of the recently biopsied hypermetabolic pulmonary nodule seen on prior PET-CT. No definite pleural effusion or pneumothorax. No acute skeletal abnormality. IMPRESSION: 1. Interval left lung biopsy with associated marker. No pneumothorax. 2. Stent again visualized within the central superior vena cava. Electronically Signed   By: Neita Garnet M.D.   On: 11/16/2022 10:49   DG C-ARM BRONCHOSCOPY  Result Date: 11/16/2022 C-ARM BRONCHOSCOPY: Fluoroscopy was utilized by the requesting physician.  No radiographic interpretation.     Assessment & Plan:   No problem-specific Assessment & Plan notes found for this encounter.     Glenford Bayley, NP 12/13/2022

## 2022-12-14 ENCOUNTER — Ambulatory Visit (INDEPENDENT_AMBULATORY_CARE_PROVIDER_SITE_OTHER): Payer: Medicare Other | Admitting: Primary Care

## 2022-12-14 ENCOUNTER — Encounter: Payer: Self-pay | Admitting: Primary Care

## 2022-12-14 VITALS — BP 102/66 | HR 68

## 2022-12-14 DIAGNOSIS — J209 Acute bronchitis, unspecified: Secondary | ICD-10-CM

## 2022-12-14 DIAGNOSIS — C3490 Malignant neoplasm of unspecified part of unspecified bronchus or lung: Secondary | ICD-10-CM

## 2022-12-14 DIAGNOSIS — J440 Chronic obstructive pulmonary disease with acute lower respiratory infection: Secondary | ICD-10-CM

## 2022-12-14 DIAGNOSIS — Z720 Tobacco use: Secondary | ICD-10-CM | POA: Diagnosis not present

## 2022-12-14 MED ORDER — AMOXICILLIN-POT CLAVULANATE 875-125 MG PO TABS: 1.0000 | ORAL_TABLET | Freq: Two times a day (BID) | ORAL | 0 refills | Status: DC

## 2022-12-14 NOTE — Patient Instructions (Addendum)
Recommendations: Resume Advair 2 puffs morning and evening Continue Robitussin Take Augmentin twice daily as directed  Call med/oncology to set up follow-up, if you have not heard from them by the end of the week let our office know Quit smoking, use nicotine patches/ Call 1-800-QUIT-NOW for free nicotine patches   Follow-up 3 months with DR. Byrum or sooner if needed    Nicotine Patches What is this medication? NICOTINE (NIK oh teen) helps you quit smoking. It reduces cravings for nicotine, the addictive substance found in tobacco. It may also help reduce symptoms of withdrawal. It is most effective when used in combination with a stop-smoking program. This medicine may be used for other purposes; ask your health care provider or pharmacist if you have questions. COMMON BRAND NAME(S): Habitrol, Nicoderm CQ, Nicotrol What should I tell my care team before I take this medication? They need to know if you have any of these conditions: Diabetes Heart disease, angina, irregular heartbeat, or previous heart attack High blood pressure High thyroid levels Lung or breathing disease, such as asthma Pheochromocytoma Seizures or a history of seizures Skin problems, such as eczema Stomach ulcers, other stomach or intestine problems An unusual or allergic reaction to nicotine, adhesives, other medications, foods, dyes, or preservatives Pregnant or trying to get pregnant Breastfeeding How should I use this medication? This medication is for external use only. Follow the directions on the label. Do not cut or trim the patch. After applying the patch, wash your hands. Change the patch every day in the morning, keeping to a regular schedule. When you apply a new patch, use a new area of skin. Wait at least 1 week before using the same area again. This medication comes with INSTRUCTIONS FOR USE. Ask your pharmacist for directions on how to use this medication. Read the information carefully. Talk to  your pharmacist or care team if you have questions. Talk to your care team about the use of this medication in children. Special care may be needed. Overdosage: If you think you have taken too much of this medicine contact a poison control center or emergency room at once. NOTE: This medicine is only for you. Do not share this medicine with others. What if I miss a dose? If you forget to replace a patch, use it as soon as you can. Only use one patch at a time and do not leave on the skin for longer than directed. If a patch falls off, you can replace it, but keep to your schedule and remove the patch at the right time. What may interact with this medication? Certain medications for lung or breathing disease, such as asthma Medications for blood pressure Medications for depression This list may not describe all possible interactions. Give your health care provider a list of all the medicines, herbs, non-prescription drugs, or dietary supplements you use. Also tell them if you smoke, drink alcohol, or use illegal drugs. Some items may interact with your medicine. What should I watch for while using this medication? Visit your care team for regular checks on your progress. Talk to your care team about what you can do to improve your chances of quitting. If you are going to need surgery, an MRI, CT scan, or other procedure, tell your care team that you are using this medication. You may need to remove the patch before the procedure. What side effects may I notice from receiving this medication? Side effects that you should report to your care team as soon  as possible: Allergic reactions--skin rash, itching, hives, swelling of the face, lips, tongue, or throat Heart palpitations--rapid, pounding, or irregular heartbeat Increase in blood pressure Side effects that usually do not require medical attention (report to your care team if they continue or are  bothersome): Dizziness Headache Heartburn Hiccups Irritation at application site Trouble sleeping This list may not describe all possible side effects. Call your doctor for medical advice about side effects. You may report side effects to FDA at 1-800-FDA-1088. Where should I keep my medication? This product may have enough nicotine in it to make children and pets sick. Keep it away from children and pets. After using, throw away as directed on the package. Store at room temperature between 20 and 25 degrees C (68 and 77 degrees F). Protect from heat and light. Keep this medication in the original container until ready to use. Throw away unused medication after the expiration date. NOTE: This sheet is a summary. It may not cover all possible information. If you have questions about this medicine, talk to your doctor, pharmacist, or health care provider.  2024 Elsevier/Gold Standard (2022-01-19 00:00:00)   Steps to Quit Smoking Smoking tobacco is the leading cause of preventable death. It can affect almost every organ in the body. Smoking puts you and people around you at risk for many serious, long-lasting (chronic) diseases. Quitting smoking can be hard, but it is one of the best things that you can do for your health. It is never too late to quit. Do not give up if you cannot quit the first time. Some people need to try many times to quit. Do your best to stick to your quit plan, and talk with your doctor if you have any questions or concerns. How do I get ready to quit? Pick a date to quit. Set a date within the next 2 weeks to give you time to prepare. Write down the reasons why you are quitting. Keep this list in places where you will see it often. Tell your family, friends, and co-workers that you are quitting. Their support is important. Talk with your doctor about the choices that may help you quit. Find out if your health insurance will pay for these treatments. Know the people,  places, things, and activities that make you want to smoke (triggers). Avoid them. What first steps can I take to quit smoking? Throw away all cigarettes at home, at work, and in your car. Throw away the things that you use when you smoke, such as ashtrays and lighters. Clean your car. Empty the ashtray. Clean your home, including curtains and carpets. What can I do to help me quit smoking? Talk with your doctor about taking medicines and seeing a counselor. You are more likely to succeed when you do both. If you are pregnant or breastfeeding: Talk with your doctor about counseling or other ways to quit smoking. Do not take medicine to help you quit smoking unless your doctor tells you to. Quit right away Quit smoking completely, instead of slowly cutting back on how much you smoke over a period of time. Stopping smoking right away may be more successful than slowly quitting. Go to counseling. In-person is best if this is an option. You are more likely to quit if you go to counseling sessions regularly. Take medicine You may take medicines to help you quit. Some medicines need a prescription, and some you can buy over-the-counter. Some medicines may contain a drug called nicotine to replace the nicotine  in cigarettes. Medicines may: Help you stop having the desire to smoke (cravings). Help to stop the problems that come when you stop smoking (withdrawal symptoms). Your doctor may ask you to use: Nicotine patches, gum, or lozenges. Nicotine inhalers or sprays. Non-nicotine medicine that you take by mouth. Find resources Find resources and other ways to help you quit smoking and remain smoke-free after you quit. They include: Online chats with a Veterinary surgeon. Phone quitlines. Printed Materials engineer. Support groups or group counseling. Text messaging programs. Mobile phone apps. Use apps on your mobile phone or tablet that can help you stick to your quit plan. Examples of free services  include Quit Guide from the CDC and smokefree.gov  What can I do to make it easier to quit?  Talk to your family and friends. Ask them to support and encourage you. Call a phone quitline, such as 1-800-QUIT-NOW, reach out to support groups, or work with a Veterinary surgeon. Ask people who smoke to not smoke around you. Avoid places that make you want to smoke, such as: Bars. Parties. Smoke-break areas at work. Spend time with people who do not smoke. Lower the stress in your life. Stress can make you want to smoke. Try these things to lower stress: Getting regular exercise. Doing deep-breathing exercises. Doing yoga. Meditating. What benefits will I see if I quit smoking? Over time, you may have: A better sense of smell and taste. Less coughing and sore throat. A slower heart rate. Lower blood pressure. Clearer skin. Better breathing. Fewer sick days. Summary Quitting smoking can be hard, but it is one of the best things that you can do for your health. Do not give up if you cannot quit the first time. Some people need to try many times to quit. When you decide to quit smoking, make a plan to help you succeed. Quit smoking right away, not slowly over a period of time. When you start quitting, get help and support to keep you smoke-free. This information is not intended to replace advice given to you by your health care provider. Make sure you discuss any questions you have with your health care provider. Document Revised: 01/10/2021 Document Reviewed: 01/10/2021 Elsevier Patient Education  2024 [employer].

## 2022-12-18 ENCOUNTER — Inpatient Hospital Stay (HOSPITAL_COMMUNITY): Payer: Medicare Other

## 2022-12-18 ENCOUNTER — Emergency Department (HOSPITAL_COMMUNITY): Payer: Medicare Other

## 2022-12-18 ENCOUNTER — Inpatient Hospital Stay (HOSPITAL_COMMUNITY)
Admission: EM | Admit: 2022-12-18 | Discharge: 2022-12-24 | DRG: 193 | Disposition: A | Discharge status: Home / Self Care | Payer: Medicare Other | Attending: Infectious Diseases | Admitting: Infectious Diseases

## 2022-12-18 ENCOUNTER — Other Ambulatory Visit: Payer: Self-pay

## 2022-12-18 ENCOUNTER — Encounter (HOSPITAL_COMMUNITY): Payer: Self-pay

## 2022-12-18 DIAGNOSIS — R0602 Shortness of breath: Secondary | ICD-10-CM | POA: Diagnosis present

## 2022-12-18 DIAGNOSIS — F32A Depression, unspecified: Secondary | ICD-10-CM | POA: Diagnosis present

## 2022-12-18 DIAGNOSIS — Z7901 Long term (current) use of anticoagulants: Secondary | ICD-10-CM

## 2022-12-18 DIAGNOSIS — H409 Unspecified glaucoma: Secondary | ICD-10-CM | POA: Diagnosis present

## 2022-12-18 DIAGNOSIS — K862 Cyst of pancreas: Secondary | ICD-10-CM | POA: Diagnosis present

## 2022-12-18 DIAGNOSIS — Z820 Family history of epilepsy and other diseases of the nervous system: Secondary | ICD-10-CM

## 2022-12-18 DIAGNOSIS — J9601 Acute respiratory failure with hypoxia: Secondary | ICD-10-CM | POA: Diagnosis present

## 2022-12-18 DIAGNOSIS — Z992 Dependence on renal dialysis: Secondary | ICD-10-CM

## 2022-12-18 DIAGNOSIS — K7460 Unspecified cirrhosis of liver: Secondary | ICD-10-CM | POA: Insufficient documentation

## 2022-12-18 DIAGNOSIS — F17210 Nicotine dependence, cigarettes, uncomplicated: Secondary | ICD-10-CM | POA: Diagnosis present

## 2022-12-18 DIAGNOSIS — H5461 Unqualified visual loss, right eye, normal vision left eye: Secondary | ICD-10-CM | POA: Diagnosis present

## 2022-12-18 DIAGNOSIS — I959 Hypotension, unspecified: Principal | ICD-10-CM | POA: Diagnosis present

## 2022-12-18 DIAGNOSIS — Z800 Family history of malignant neoplasm of digestive organs: Secondary | ICD-10-CM

## 2022-12-18 DIAGNOSIS — Z79899 Other long term (current) drug therapy: Secondary | ICD-10-CM

## 2022-12-18 DIAGNOSIS — I2489 Other forms of acute ischemic heart disease: Secondary | ICD-10-CM | POA: Diagnosis present

## 2022-12-18 DIAGNOSIS — D631 Anemia in chronic kidney disease: Secondary | ICD-10-CM | POA: Diagnosis present

## 2022-12-18 DIAGNOSIS — Z881 Allergy status to other antibiotic agents status: Secondary | ICD-10-CM

## 2022-12-18 DIAGNOSIS — I4892 Unspecified atrial flutter: Secondary | ICD-10-CM | POA: Diagnosis present

## 2022-12-18 DIAGNOSIS — Z905 Acquired absence of kidney: Secondary | ICD-10-CM

## 2022-12-18 DIAGNOSIS — E861 Hypovolemia: Secondary | ICD-10-CM | POA: Diagnosis present

## 2022-12-18 DIAGNOSIS — K219 Gastro-esophageal reflux disease without esophagitis: Secondary | ICD-10-CM | POA: Diagnosis present

## 2022-12-18 DIAGNOSIS — R911 Solitary pulmonary nodule: Secondary | ICD-10-CM | POA: Diagnosis not present

## 2022-12-18 DIAGNOSIS — G629 Polyneuropathy, unspecified: Secondary | ICD-10-CM

## 2022-12-18 DIAGNOSIS — Z8249 Family history of ischemic heart disease and other diseases of the circulatory system: Secondary | ICD-10-CM

## 2022-12-18 DIAGNOSIS — Z560 Unemployment, unspecified: Secondary | ICD-10-CM

## 2022-12-18 DIAGNOSIS — E1169 Type 2 diabetes mellitus with other specified complication: Secondary | ICD-10-CM

## 2022-12-18 DIAGNOSIS — C3412 Malignant neoplasm of upper lobe, left bronchus or lung: Secondary | ICD-10-CM | POA: Diagnosis not present

## 2022-12-18 DIAGNOSIS — M8580 Other specified disorders of bone density and structure, unspecified site: Secondary | ICD-10-CM | POA: Diagnosis present

## 2022-12-18 DIAGNOSIS — Y95 Nosocomial condition: Secondary | ICD-10-CM | POA: Diagnosis present

## 2022-12-18 DIAGNOSIS — Z8616 Personal history of COVID-19: Secondary | ICD-10-CM

## 2022-12-18 DIAGNOSIS — C3410 Malignant neoplasm of upper lobe, unspecified bronchus or lung: Secondary | ICD-10-CM | POA: Insufficient documentation

## 2022-12-18 DIAGNOSIS — J440 Chronic obstructive pulmonary disease with acute lower respiratory infection: Secondary | ICD-10-CM | POA: Diagnosis present

## 2022-12-18 DIAGNOSIS — E871 Hypo-osmolality and hyponatremia: Secondary | ICD-10-CM | POA: Diagnosis present

## 2022-12-18 DIAGNOSIS — Z7951 Long term (current) use of inhaled steroids: Secondary | ICD-10-CM

## 2022-12-18 DIAGNOSIS — N186 End stage renal disease: Secondary | ICD-10-CM | POA: Diagnosis present

## 2022-12-18 DIAGNOSIS — G4700 Insomnia, unspecified: Secondary | ICD-10-CM | POA: Diagnosis present

## 2022-12-18 DIAGNOSIS — M898X9 Other specified disorders of bone, unspecified site: Secondary | ICD-10-CM | POA: Diagnosis present

## 2022-12-18 DIAGNOSIS — K869 Disease of pancreas, unspecified: Secondary | ICD-10-CM

## 2022-12-18 DIAGNOSIS — R918 Other nonspecific abnormal finding of lung field: Secondary | ICD-10-CM | POA: Diagnosis not present

## 2022-12-18 DIAGNOSIS — K3184 Gastroparesis: Secondary | ICD-10-CM | POA: Diagnosis present

## 2022-12-18 DIAGNOSIS — G894 Chronic pain syndrome: Secondary | ICD-10-CM | POA: Diagnosis present

## 2022-12-18 DIAGNOSIS — Z83511 Family history of glaucoma: Secondary | ICD-10-CM

## 2022-12-18 DIAGNOSIS — M797 Fibromyalgia: Secondary | ICD-10-CM | POA: Diagnosis present

## 2022-12-18 DIAGNOSIS — N2581 Secondary hyperparathyroidism of renal origin: Secondary | ICD-10-CM | POA: Diagnosis present

## 2022-12-18 DIAGNOSIS — J189 Pneumonia, unspecified organism: Principal | ICD-10-CM | POA: Diagnosis present

## 2022-12-18 DIAGNOSIS — E860 Dehydration: Secondary | ICD-10-CM | POA: Diagnosis present

## 2022-12-18 DIAGNOSIS — J90 Pleural effusion, not elsewhere classified: Secondary | ICD-10-CM | POA: Diagnosis not present

## 2022-12-18 DIAGNOSIS — I5032 Chronic diastolic (congestive) heart failure: Secondary | ICD-10-CM | POA: Diagnosis present

## 2022-12-18 DIAGNOSIS — M1A3720 Chronic gout due to renal impairment, left ankle and foot, without tophus (tophi): Secondary | ICD-10-CM | POA: Diagnosis present

## 2022-12-18 DIAGNOSIS — R0600 Dyspnea, unspecified: Secondary | ICD-10-CM | POA: Diagnosis present

## 2022-12-18 DIAGNOSIS — M109 Gout, unspecified: Secondary | ICD-10-CM | POA: Diagnosis present

## 2022-12-18 DIAGNOSIS — I9589 Other hypotension: Secondary | ICD-10-CM | POA: Diagnosis present

## 2022-12-18 DIAGNOSIS — Z8673 Personal history of transient ischemic attack (TIA), and cerebral infarction without residual deficits: Secondary | ICD-10-CM

## 2022-12-18 DIAGNOSIS — I4891 Unspecified atrial fibrillation: Secondary | ICD-10-CM | POA: Diagnosis present

## 2022-12-18 DIAGNOSIS — M5030 Other cervical disc degeneration, unspecified cervical region: Secondary | ICD-10-CM | POA: Diagnosis present

## 2022-12-18 DIAGNOSIS — J9620 Acute and chronic respiratory failure, unspecified whether with hypoxia or hypercapnia: Secondary | ICD-10-CM | POA: Diagnosis present

## 2022-12-18 DIAGNOSIS — Z833 Family history of diabetes mellitus: Secondary | ICD-10-CM

## 2022-12-18 DIAGNOSIS — I272 Pulmonary hypertension, unspecified: Secondary | ICD-10-CM | POA: Diagnosis present

## 2022-12-18 DIAGNOSIS — Z981 Arthrodesis status: Secondary | ICD-10-CM

## 2022-12-18 LAB — HEPATIC FUNCTION PANEL
ALT: 14 U/L (ref 0–44)
AST: 15 U/L (ref 15–41)
Albumin: 2.9 g/dL — ABNORMAL LOW (ref 3.5–5.0)
Alkaline Phosphatase: 156 U/L — ABNORMAL HIGH (ref 38–126)
Bilirubin, Direct: 0.1 mg/dL (ref 0.0–0.2)
Indirect Bilirubin: 0.9 mg/dL (ref 0.3–0.9)
Total Bilirubin: 1 mg/dL (ref <1.2)
Total Protein: 6.3 g/dL — ABNORMAL LOW (ref 6.5–8.1)

## 2022-12-18 LAB — HIV ANTIBODY (ROUTINE TESTING W REFLEX): HIV Screen 4th Generation wRfx: NONREACTIVE

## 2022-12-18 LAB — BASIC METABOLIC PANEL
Anion gap: 17 — ABNORMAL HIGH (ref 5–15)
BUN: 25 mg/dL — ABNORMAL HIGH (ref 6–20)
CO2: 23 mmol/L (ref 22–32)
Calcium: 9.2 mg/dL (ref 8.9–10.3)
Chloride: 95 mmol/L — ABNORMAL LOW (ref 98–111)
Creatinine, Ser: 9.3 mg/dL — ABNORMAL HIGH (ref 0.44–1.00)
GFR, Estimated: 5 mL/min — ABNORMAL LOW (ref >60)
Glucose, Bld: 89 mg/dL (ref 70–99)
Potassium: 3.8 mmol/L (ref 3.5–5.1)
Sodium: 135 mmol/L (ref 135–145)

## 2022-12-18 LAB — RESPIRATORY PANEL BY PCR

## 2022-12-18 LAB — GROUP A STREP BY PCR: Group A Strep by PCR: NOT DETECTED

## 2022-12-18 LAB — CBC
HCT: 28.6 % — ABNORMAL LOW (ref 36.0–46.0)
Hemoglobin: 8.7 g/dL — ABNORMAL LOW (ref 12.0–15.0)
MCH: 28.5 pg (ref 26.0–34.0)
MCHC: 30.4 g/dL (ref 30.0–36.0)
MCV: 93.8 fL (ref 80.0–100.0)
Platelets: 225 10*3/uL (ref 150–400)
RBC: 3.05 MIL/uL — ABNORMAL LOW (ref 3.87–5.11)
RDW: 20.7 % — ABNORMAL HIGH (ref 11.5–15.5)
WBC: 8.2 10*3/uL (ref 4.0–10.5)
nRBC: 0 % (ref 0.0–0.2)

## 2022-12-18 LAB — HEPATITIS B SURFACE ANTIGEN: Hepatitis B Surface Ag: NONREACTIVE

## 2022-12-18 LAB — PHOSPHORUS: Phosphorus: 5.6 mg/dL — ABNORMAL HIGH (ref 2.5–4.6)

## 2022-12-18 LAB — TROPONIN I (HIGH SENSITIVITY)
Troponin I (High Sensitivity): 52 ng/L — ABNORMAL HIGH (ref <18)
Troponin I (High Sensitivity): 46 ng/L — ABNORMAL HIGH (ref <18)

## 2022-12-18 LAB — MRSA NEXT GEN BY PCR, NASAL: MRSA by PCR Next Gen: NOT DETECTED

## 2022-12-18 LAB — RESP PANEL BY RT-PCR (RSV, FLU A&B, COVID)  RVPGX2
Influenza A by PCR: NEGATIVE
Influenza B by PCR: NEGATIVE
Resp Syncytial Virus by PCR: NEGATIVE
SARS Coronavirus 2 by RT PCR: NEGATIVE

## 2022-12-18 LAB — CK
Total CK: 45 U/L (ref 38–234)
Total CK: 45 U/L (ref 38–234)

## 2022-12-18 LAB — PROCALCITONIN: Procalcitonin: 6.25 ng/mL

## 2022-12-18 LAB — URIC ACID: Uric Acid, Serum: 6.3 mg/dL (ref 2.5–7.1)

## 2022-12-18 MED ORDER — FAMOTIDINE 20 MG PO TABS
20.0000 mg | ORAL_TABLET | Freq: Two times a day (BID) | ORAL | Status: DC
  Administered 2022-12-19 (×2): 20 mg via ORAL
  Filled 2022-12-18 (×2): qty 1

## 2022-12-18 MED ORDER — MOMETASONE FURO-FORMOTEROL FUM 200-5 MCG/ACT IN AERO
2.0000 | INHALATION_SPRAY | Freq: Two times a day (BID) | RESPIRATORY_TRACT | Status: DC
  Administered 2022-12-19 – 2022-12-24 (×9): 2 via RESPIRATORY_TRACT
  Filled 2022-12-18: qty 8.8

## 2022-12-18 MED ORDER — ACETAMINOPHEN 650 MG RE SUPP: 650.0000 mg | Freq: Four times a day (QID) | RECTAL | Status: DC | PRN

## 2022-12-18 MED ORDER — DOXYCYCLINE HYCLATE 100 MG IV SOLR
100.0000 mg | Freq: Two times a day (BID) | INTRAVENOUS | Status: AC
  Administered 2022-12-18 – 2022-12-20 (×5): 100 mg via INTRAVENOUS
  Filled 2022-12-18 (×5): qty 100

## 2022-12-18 MED ORDER — HEPARIN SODIUM (PORCINE) 1000 UNIT/ML IJ SOLN: 1900.0000 [IU] | Freq: Once | INTRAMUSCULAR | Status: DC

## 2022-12-18 MED ORDER — MIDODRINE HCL 5 MG PO TABS
15.0000 mg | ORAL_TABLET | Freq: Three times a day (TID) | ORAL | Status: DC
  Administered 2022-12-18 – 2022-12-24 (×16): 15 mg via ORAL
  Filled 2022-12-18 (×18): qty 3

## 2022-12-18 MED ORDER — ALLOPURINOL 100 MG PO TABS
100.0000 mg | ORAL_TABLET | Freq: Every day | ORAL | Status: DC
  Administered 2022-12-18 – 2022-12-23 (×6): 100 mg via ORAL
  Filled 2022-12-18 (×8): qty 1

## 2022-12-18 MED ORDER — OXYCODONE HCL 5 MG PO TABS
5.0000 mg | ORAL_TABLET | Freq: Four times a day (QID) | ORAL | Status: DC | PRN
  Administered 2022-12-19 – 2022-12-23 (×3): 5 mg via ORAL
  Filled 2022-12-18 (×3): qty 1

## 2022-12-18 MED ORDER — METOCLOPRAMIDE HCL 5 MG PO TABS
10.0000 mg | ORAL_TABLET | Freq: Three times a day (TID) | ORAL | Status: DC
Start: 2022-12-18 — End: 2022-12-24
  Administered 2022-12-18 – 2022-12-24 (×14): 10 mg via ORAL
  Filled 2022-12-18 (×16): qty 2

## 2022-12-18 MED ORDER — DORZOLAMIDE HCL-TIMOLOL MAL 2-0.5 % OP SOLN
1.0000 [drp] | Freq: Two times a day (BID) | OPHTHALMIC | Status: DC
  Administered 2022-12-19 – 2022-12-23 (×9): 1 [drp] via OPHTHALMIC
  Filled 2022-12-18 (×2): qty 10

## 2022-12-18 MED ORDER — APIXABAN 2.5 MG PO TABS
2.5000 mg | ORAL_TABLET | Freq: Two times a day (BID) | ORAL | Status: DC
  Administered 2022-12-19 – 2022-12-23 (×11): 2.5 mg via ORAL
  Filled 2022-12-18 (×11): qty 1

## 2022-12-18 MED ORDER — HEPARIN SODIUM (PORCINE) 5000 UNIT/ML IJ SOLN: 5000.0000 [IU] | Freq: Three times a day (TID) | INTRAMUSCULAR | Status: DC

## 2022-12-18 MED ORDER — VANCOMYCIN HCL IN DEXTROSE 1-5 GM/200ML-% IV SOLN
1000.0000 mg | Freq: Once | INTRAVENOUS | Status: AC
  Administered 2022-12-18: 1000 mg via INTRAVENOUS
  Filled 2022-12-18: qty 200

## 2022-12-18 MED ORDER — DOXYCYCLINE HYCLATE 100 MG IV SOLR
100.0000 mg | Freq: Once | INTRAVENOUS | Status: AC
  Administered 2022-12-18: 100 mg via INTRAVENOUS
  Filled 2022-12-18: qty 100

## 2022-12-18 MED ORDER — DICLOFENAC SODIUM 1 % EX GEL: 4.0000 g | Freq: Four times a day (QID) | CUTANEOUS | Status: DC | PRN

## 2022-12-18 MED ORDER — VANCOMYCIN VARIABLE DOSE PER UNSTABLE RENAL FUNCTION (PHARMACIST DOSING): Status: DC

## 2022-12-18 MED ORDER — OXYCODONE-ACETAMINOPHEN 5-325 MG PO TABS
1.0000 | ORAL_TABLET | Freq: Four times a day (QID) | ORAL | Status: DC | PRN
  Administered 2022-12-19 – 2022-12-24 (×3): 1 via ORAL
  Filled 2022-12-18 (×3): qty 1

## 2022-12-18 MED ORDER — POLYETHYLENE GLYCOL 3350 17 G PO PACK: 17.0000 g | PACK | Freq: Every day | ORAL | Status: DC | PRN

## 2022-12-18 MED ORDER — GABAPENTIN 100 MG PO CAPS
100.0000 mg | ORAL_CAPSULE | Freq: Three times a day (TID) | ORAL | Status: DC | PRN
  Administered 2022-12-19: 100 mg via ORAL
  Filled 2022-12-18: qty 1

## 2022-12-18 MED ORDER — SODIUM CHLORIDE 0.9 % IV BOLUS
250.0000 mL | Freq: Once | INTRAVENOUS | Status: AC
  Administered 2022-12-18: 250 mL via INTRAVENOUS

## 2022-12-18 MED ORDER — SODIUM CHLORIDE 0.9 % IV SOLN
1.0000 g | Freq: Once | INTRAVENOUS | Status: AC
  Administered 2022-12-18: 1 g via INTRAVENOUS
  Filled 2022-12-18: qty 10

## 2022-12-18 MED ORDER — ACETAMINOPHEN 325 MG PO TABS
650.0000 mg | ORAL_TABLET | Freq: Four times a day (QID) | ORAL | Status: DC | PRN
  Administered 2022-12-21: 650 mg via ORAL
  Filled 2022-12-18: qty 2

## 2022-12-18 MED ORDER — CHLORHEXIDINE GLUCONATE CLOTH 2 % EX PADS
6.0000 | MEDICATED_PAD | Freq: Every day | CUTANEOUS | Status: DC
  Administered 2022-12-19 – 2022-12-23 (×5): 6 via TOPICAL

## 2022-12-18 MED ORDER — HEPARIN SODIUM (PORCINE) 1000 UNIT/ML IJ SOLN
1000.0000 [IU] | Freq: Once | INTRAMUSCULAR | Status: AC
  Administered 2022-12-18: 1000 [IU] via INTRAVENOUS

## 2022-12-18 MED ORDER — ESCITALOPRAM OXALATE 20 MG PO TABS
20.0000 mg | ORAL_TABLET | Freq: Every day | ORAL | Status: DC
  Administered 2022-12-18 – 2022-12-23 (×6): 20 mg via ORAL
  Filled 2022-12-18 (×7): qty 1

## 2022-12-18 MED ORDER — PIPERACILLIN-TAZOBACTAM IN DEX 2-0.25 GM/50ML IV SOLN
2.2500 g | Freq: Three times a day (TID) | INTRAVENOUS | Status: DC
  Administered 2022-12-19 – 2022-12-24 (×17): 2.25 g via INTRAVENOUS
  Filled 2022-12-18 (×19): qty 50

## 2022-12-18 MED ORDER — AMIODARONE HCL 200 MG PO TABS
100.0000 mg | ORAL_TABLET | Freq: Every day | ORAL | Status: DC
  Administered 2022-12-18 – 2022-12-23 (×6): 100 mg via ORAL
  Filled 2022-12-18 (×7): qty 1

## 2022-12-18 MED ORDER — ALBUTEROL SULFATE (2.5 MG/3ML) 0.083% IN NEBU: 2.5000 mg | INHALATION_SOLUTION | RESPIRATORY_TRACT | Status: DC | PRN

## 2022-12-18 MED ORDER — OXYCODONE-ACETAMINOPHEN 10-325 MG PO TABS: 1.0000 | ORAL_TABLET | Freq: Four times a day (QID) | ORAL | Status: DC | PRN

## 2022-12-18 MED ORDER — IOHEXOL 350 MG/ML SOLN
60.0000 mL | Freq: Once | INTRAVENOUS | Status: AC | PRN
  Administered 2022-12-18: 60 mL via INTRAVENOUS

## 2022-12-18 MED ORDER — MIDODRINE HCL 5 MG PO TABS: 15.0000 mg | ORAL_TABLET | Freq: Three times a day (TID) | ORAL | Status: DC

## 2022-12-18 MED ORDER — HYDROMORPHONE HCL 1 MG/ML IJ SOLN: 0.2500 mg | Freq: Once | INTRAMUSCULAR | Status: DC

## 2022-12-18 MED ORDER — ACETAMINOPHEN 500 MG PO TABS
1000.0000 mg | ORAL_TABLET | Freq: Once | ORAL | Status: AC
  Administered 2022-12-18: 1000 mg via ORAL
  Filled 2022-12-18: qty 2

## 2022-12-18 MED ORDER — SODIUM CHLORIDE 0.9 % IV BOLUS
1000.0000 mL | Freq: Once | INTRAVENOUS | Status: AC
  Administered 2022-12-18: 1000 mL via INTRAVENOUS

## 2022-12-18 MED ORDER — ZOLPIDEM TARTRATE 5 MG PO TABS
5.0000 mg | ORAL_TABLET | Freq: Every evening | ORAL | Status: DC | PRN
  Administered 2022-12-20: 5 mg via ORAL
  Filled 2022-12-18: qty 1

## 2022-12-18 MED ORDER — IPRATROPIUM-ALBUTEROL 0.5-2.5 (3) MG/3ML IN SOLN
3.0000 mL | Freq: Once | RESPIRATORY_TRACT | Status: AC
  Administered 2022-12-18: 3 mL via RESPIRATORY_TRACT
  Filled 2022-12-18: qty 3

## 2022-12-18 MED ORDER — FLUTICASONE FUROATE-VILANTEROL 200-25 MCG/ACT IN AEPB
1.0000 | INHALATION_SPRAY | Freq: Every day | RESPIRATORY_TRACT | Status: DC
  Administered 2022-12-19 – 2022-12-24 (×4): 1 via RESPIRATORY_TRACT
  Filled 2022-12-18: qty 28

## 2022-12-18 MED ORDER — COLCHICINE 0.6 MG PO TABS
0.6000 mg | ORAL_TABLET | Freq: Every day | ORAL | Status: DC | PRN
  Filled 2022-12-18: qty 1

## 2022-12-18 MED ORDER — CYCLOBENZAPRINE HCL 5 MG PO TABS: 5.0000 mg | ORAL_TABLET | Freq: Every evening | ORAL | Status: DC | PRN

## 2022-12-18 MED ORDER — LACTATED RINGERS IV BOLUS: 1000.0000 mL | Freq: Once | INTRAVENOUS | Status: DC

## 2022-12-18 MED ORDER — HEPARIN SODIUM (PORCINE) 1000 UNIT/ML IJ SOLN
INTRAMUSCULAR | Status: AC
  Administered 2022-12-18: 2000 [IU] via INTRAVENOUS_CENTRAL
  Filled 2022-12-18: qty 2

## 2022-12-18 MED ORDER — SUCROFERRIC OXYHYDROXIDE 500 MG PO CHEW
500.0000 mg | CHEWABLE_TABLET | Freq: Three times a day (TID) | ORAL | Status: DC
  Administered 2022-12-19 – 2022-12-24 (×9): 500 mg via ORAL
  Filled 2022-12-18 (×19): qty 1

## 2022-12-18 NOTE — ED Notes (Signed)
Pt transported to xray 

## 2022-12-18 NOTE — ED Notes (Signed)
Informed Dr Jearld Fenton face to face pt still hypotensive. I received a verbal order to give another bolus of NS 250 ml.

## 2022-12-18 NOTE — ED Notes (Signed)
ED TO INPATIENT HANDOFF REPORT  ED Nurse Name and Phone #: Percival Spanish [phone]  S Name/Age/Gender Tina Watkins 57 y.o. female Room/Bed: TRACC/TRACC  Code Status   Code Status: Full Code  Home/SNF/Other Home Patient oriented to: self, place, time, and situation Is this baseline? Yes   Triage Complete: Triage complete  Chief Complaint Shortness of breath [R06.02]  Triage Note Pt. Stated, Lavenia Atlas had sore throat, cough, and some SOB and feeling bad. This started on Monday. I went to dialysis and they did a COVID test on Tuesday. I went to my pain management Dr on [address]. And they did a COVID test and it was negative. Im achy all over and my joints.   Allergies Allergies  Allergen Reactions   Levofloxacin Itching and Rash   Tape Other (See Comments)    "Certain surgical tapes peel off my skin"    Level of Care/Admitting Diagnosis ED Disposition     ED Disposition  Admit   Condition  --   Comment  Hospital Area: [facility] [[id number]]  Level of Care: Med-Surg [16]  May admit patient to [facility] or [facility] if equivalent level of care is available:: No  Covid Evaluation: Asymptomatic - no recent exposure (last 10 days) testing not required  Diagnosis: Shortness of breath [786.05.ICD-9-CM]  Admitting Physician: Dickie La [[id number]]  Attending Physician: Dickie La [[id number]]  Certification:: I certify this patient will need inpatient services for at least 2 midnights  Expected Medical Readiness: 12/21/2022          B Medical/Surgery History Past Medical History:  Diagnosis Date   Anxiety    Bacteremia due to Gram-negative bacteria 05/23/2011   Blind    right eye   Blind right eye    CHF (congestive heart failure) (HCC)    Chronic lower back pain    Complication of anesthesia    "woke up during OR; I have an extremely high tolerance" (12/11/2011) 1 procedure was graft; the other procedures were procedures that are typically done with sedation.    DDD (degenerative disc disease), cervical    Depression    Dysrhythmia    "tachycardia" (12/11/2011) new onset afib 10/15/14 EKG   E coli bacteremia 06/18/2011   Elevated LDL cholesterol level 12/2018   ESRD (end stage renal disease) (HCC) 06/12/2011   Tues-Thurs-Sat dialysis   Fibromyalgia    Gastroparesis    GERD (gastroesophageal reflux disease)    Glaucoma    right eye   Gout    H/O carpal tunnel syndrome    Headache 10/2019   no current problem per patient on 11/12/22   Headache(784.0)    "not often anymore" (12/11/2011)   Herpes genitalia 1994   History of blood transfusion    "more than a few times" (12/11/2011)   History of stomach ulcers    Hypotension    Iron deficiency anemia    New onset a-fib (HCC)    10/15/14 EKG   Osteopenia    Peripheral neuropathy 11/2018   Pneumonia    x several but years ago   Pressure ulcer of sacral region, stage 1 07/2019   Seizures (HCC) 1994   "post transplant; only have had that one" (12/11/2011)   Spinal stenosis in cervical region    Stroke [facility])     left basal ganglia lacunar infarct; Right frontal lobe lacunar infarct.   Stroke [facility]) ~ 1999; 2001   "briefly lost my vision; lost my right eye" (12/11/2011)   Vitamin  D deficiency 12/2018   Past Surgical History:  Procedure Laterality Date   ANTERIOR CERVICAL DECOMP/DISCECTOMY FUSION N/A 01/08/2015   Procedure: Anterior Cervical Three-Four/Four-Five Decompression/Diskectomy/Fusion;  Surgeon: Hilda Lias, MD;  Location: [facility] NEURO ORS;  Service: Neurosurgery;  Laterality: N/A;  C3-4 C4-5 Anterior cervical decompression/diskectomy/fusion   APPENDECTOMY  ~ 2004   BIOPSY  09/12/2020   Procedure: BIOPSY;  Surgeon: Rachael Fee, MD;  Location: [facility] ENDOSCOPY;  Service: Endoscopy;;   BRONCHIAL BIOPSY  11/16/2022   Procedure: BRONCHIAL BIOPSIES;  Surgeon: Leslye Peer, MD;  Location: [facility] ENDOSCOPY;  Service: Pulmonary;;   BRONCHIAL BRUSHINGS  11/16/2022   Procedure: BRONCHIAL  BRUSHINGS;  Surgeon: Leslye Peer, MD;  Location: [facility] ENDOSCOPY;  Service: Pulmonary;;   BRONCHIAL NEEDLE ASPIRATION BIOPSY  11/16/2022   Procedure: BRONCHIAL NEEDLE ASPIRATION BIOPSIES;  Surgeon: Leslye Peer, MD;  Location: [facility] ENDOSCOPY;  Service: Pulmonary;;   CATARACT EXTRACTION     right eye   COLONOSCOPY     COLONOSCOPY WITH PROPOFOL N/A 09/12/2020   Procedure: COLONOSCOPY WITH PROPOFOL;  Surgeon: Rachael Fee, MD;  Location: [facility] ENDOSCOPY;  Service: Endoscopy;  Laterality: N/A;   ENUCLEATION  2001   "right"   ESOPHAGOGASTRODUODENOSCOPY (EGD) WITH PROPOFOL N/A 04/21/2012   Procedure: ESOPHAGOGASTRODUODENOSCOPY (EGD) WITH PROPOFOL;  Surgeon: Rachael Fee, MD;  Location: [facility] ENDOSCOPY;  Service: Endoscopy;  Laterality: N/A;   ESOPHAGOGASTRODUODENOSCOPY (EGD) WITH PROPOFOL N/A 09/12/2020   Procedure: ESOPHAGOGASTRODUODENOSCOPY (EGD) WITH PROPOFOL;  Surgeon: Rachael Fee, MD;  Location: [facility] ENDOSCOPY;  Service: Endoscopy;  Laterality: N/A;   FIDUCIAL MARKER PLACEMENT  11/16/2022   Procedure: FIDUCIAL MARKER PLACEMENT;  Surgeon: Leslye Peer, MD;  Location: [facility] ENDOSCOPY;  Service: Pulmonary;;   HEMOSTASIS CONTROL  11/16/2022   Procedure: HEMOSTASIS CONTROL;  Surgeon: Leslye Peer, MD;  Location: [facility] ENDOSCOPY;  Service: Pulmonary;;   INSERTION OF DIALYSIS CATHETER  1988   "AV graft LUA & LFA; LUA worked for 1 day; LFA never worked"   IR FLUORO GUIDE CV LINE RIGHT  07/11/2019   IR FLUORO GUIDE CV LINE RIGHT  10/20/2019   IR FLUORO GUIDE CV LINE RIGHT  05/31/2020   IR PTA VENOUS EXCEPT DIALYSIS CIRCUIT  05/31/2020   IR RADIOLOGY PERIPHERAL GUIDED IV START  07/11/2019   IR US GUIDE VASC ACCESS RIGHT  07/11/2019   IR US GUIDE VASC ACCESS RIGHT  07/11/2019   IR US GUIDE VASC ACCESS RIGHT  07/11/2019   IR VENOCAVAGRAM IVC  05/31/2020   KIDNEY TRANSPLANT  1994; 1999; 2005   "right"   MULTIPLE TOOTH EXTRACTIONS     POLYPECTOMY  09/12/2020   Procedure: POLYPECTOMY;  Surgeon: Rachael Fee, MD;  Location: [facility] ENDOSCOPY;  Service: Endoscopy;;   RIGHT HEART CATH N/A 03/23/2019   Procedure: RIGHT HEART CATH;  Surgeon: Dolores Patty, MD;  Location: [facility] INVASIVE CV LAB;  Service: Cardiovascular;  Laterality: N/A;   TONSILLECTOMY     TOTAL NEPHRECTOMY  1988?; 1994; 2005   VIDEO BRONCHOSCOPY WITH RADIAL ENDOBRONCHIAL ULTRASOUND  11/16/2022   Procedure: VIDEO BRONCHOSCOPY WITH RADIAL ENDOBRONCHIAL ULTRASOUND;  Surgeon: Leslye Peer, MD;  Location: [facility] ENDOSCOPY;  Service: Pulmonary;;     A IV Location/Drains/Wounds Patient Lines/Drains/Airways Status     Active Line/Drains/Airways     Name Placement date Placement time Site Days   Peripheral IV 12/18/22 20 G 1.88" Anterior;Left;Proximal Forearm 12/18/22  1104  Forearm  less than 1   Fistula / Graft Left Forearm Arteriovenous vein  graft 02/02/86  1200  Forearm  [id number]   Hemodialysis Catheter Right Femoral vein 11/14/20  --  Femoral vein  764            Intake/Output Last 24 hours  Intake/Output Summary (Last 24 hours) at 12/18/2022 1615 Last data filed at 12/18/2022 1357 Gross per 24 hour  Intake 1010.96 ml  Output --  Net 1010.96 ml    Labs/Imaging Results for orders placed or performed during the hospital encounter of 12/18/22 (from the past 48 hour(s))  Group A Strep by PCR     Status: None   Collection Time: 12/18/22  9:16 AM   Specimen: Throat; Sterile Swab  Result Value Ref Range   Group A Strep by PCR NOT DETECTED NOT DETECTED    Comment: Performed at [facility], [address], [place], [place] [address]  Basic metabolic panel     Status: Abnormal   Collection Time: 12/18/22  9:23 AM  Result Value Ref Range   Sodium 135 135 - 145 mmol/L   Potassium 3.8 3.5 - 5.1 mmol/L   Chloride 95 (L) 98 - 111 mmol/L   CO2 23 22 - 32 mmol/L   Glucose, Bld 89 70 - 99 mg/dL    Comment: Glucose reference range applies only to samples taken after fasting for at least 8 hours.   BUN 25 (H) 6 - 20 mg/dL    Creatinine, Ser 0.86 (H) 0.44 - 1.00 mg/dL   Calcium 9.2 8.9 - 57.8 mg/dL   GFR, Estimated 5 (L) >60 mL/min    Comment: (NOTE) Calculated using the CKD-EPI Creatinine Equation (2021)    Anion gap 17 (H) 5 - 15    Comment: Performed at [facility], [address] [place], [place] [address]  CBC     Status: Abnormal   Collection Time: 12/18/22  9:23 AM  Result Value Ref Range   WBC 8.2 4.0 - 10.5 K/uL   RBC 3.05 (L) 3.87 - 5.11 MIL/uL   Hemoglobin 8.7 (L) 12.0 - 15.0 g/dL   HCT 95.2 (L) 84.1 - 32.4 %   MCV 93.8 80.0 - 100.0 fL   MCH 28.5 26.0 - 34.0 pg   MCHC 30.4 30.0 - 36.0 g/dL   RDW 40.1 (H) 02.7 - 25.3 %   Platelets 225 150 - 400 K/uL   nRBC 0.0 0.0 - 0.2 %    Comment: Performed at [facility] Lab, [address], [place], [place] [address]  Hepatic function panel     Status: Abnormal   Collection Time: 12/18/22  9:23 AM  Result Value Ref Range   Total Protein 6.3 (L) 6.5 - 8.1 g/dL   Albumin 2.9 (L) 3.5 - 5.0 g/dL   AST 15 15 - 41 U/L   ALT 14 0 - 44 U/L   Alkaline Phosphatase 156 (H) 38 - 126 U/L   Total Bilirubin 1.0 <1.2 mg/dL   Bilirubin, Direct 0.1 0.0 - 0.2 mg/dL   Indirect Bilirubin 0.9 0.3 - 0.9 mg/dL    Comment: Performed at [facility] Lab, [address], [place], [place] [address]  Troponin I (High Sensitivity)     Status: Abnormal   Collection Time: 12/18/22  9:23 AM  Result Value Ref Range   Troponin I (High Sensitivity) 52 (H) <18 ng/L    Comment: (NOTE) Elevated high sensitivity troponin I (hsTnI) values and significant  changes across serial measurements may suggest ACS but many other  chronic  and acute conditions are known to elevate hsTnI results.  Refer to the "Links" section for chest pain algorithms and additional  guidance. Performed at [facility], [address] [place], [place] [address]   CK     Status: None   Collection Time: 12/18/22  9:23 AM  Result Value Ref Range   Total CK 45 38 - 234 U/L    Comment:  Performed at [facility] Lab, [address], [place], [place] [address]  Resp panel by RT-PCR (RSV, Flu A&B, Covid) Throat     Status: None   Collection Time: 12/18/22 10:05 AM   Specimen: Throat; Nasal Swab  Result Value Ref Range   SARS Coronavirus 2 by RT PCR NEGATIVE NEGATIVE   Influenza A by PCR NEGATIVE NEGATIVE   Influenza B by PCR NEGATIVE NEGATIVE    Comment: (NOTE) The Xpert Xpress SARS-CoV-2/FLU/RSV plus assay is intended as an aid in the diagnosis of influenza from Nasopharyngeal swab specimens and should not be used as a sole basis for treatment. Nasal washings and aspirates are unacceptable for Xpert Xpress SARS-CoV-2/FLU/RSV testing.  Fact Sheet for Patients: BloggerCourse.com  Fact Sheet for Healthcare Providers: SeriousBroker.it  This test is not yet approved or cleared by the [place] FDA and has been authorized for detection and/or diagnosis of SARS-CoV-2 by FDA under an Emergency Use Authorization (EUA). This EUA will remain in effect (meaning this test can be used) for the duration of the COVID-19 declaration under Section 564(b)(1) of the Act, 21 U.S.C. section 360bbb-3(b)(1), unless the authorization is terminated or revoked.     Resp Syncytial Virus by PCR NEGATIVE NEGATIVE    Comment: (NOTE) Fact Sheet for Patients: BloggerCourse.com  Fact Sheet for Healthcare Providers: SeriousBroker.it  This test is not yet approved or cleared by the [place] FDA and has been authorized for detection and/or diagnosis of SARS-CoV-2 by FDA under an Emergency Use Authorization (EUA). This EUA will remain in effect (meaning this test can be used) for the duration of the COVID-19 declaration under Section 564(b)(1) of the Act, 21 U.S.C. section 360bbb-3(b)(1), unless the authorization is terminated or revoked.  Performed at [facility], [address] [place], [place] [address]   CK     Status: None   Collection Time: 12/18/22 11:00 AM  Result Value Ref Range   Total CK 45 38 - 234 U/L    Comment: Performed at [facility], [address] [place], [place] [address]  Troponin I (High Sensitivity)     Status: Abnormal   Collection Time: 12/18/22 11:00 AM  Result Value Ref Range   Troponin I (High Sensitivity) 46 (H) <18 ng/L    Comment: (NOTE) Elevated high sensitivity troponin I (hsTnI) values and significant  changes across serial measurements may suggest ACS but many other  chronic and acute conditions are known to elevate hsTnI results.  Refer to the "Links" section for chest pain algorithms and additional  guidance. Performed at [facility], [address] [place], [place] [address]   Uric acid     Status: None   Collection Time: 12/18/22 11:00 AM  Result Value Ref Range   Uric Acid, Serum 6.3 2.5 - 7.1 mg/dL    Comment: Performed at [facility] Lab, [address] [place], [place] [address]   *Note: Due to a large number of results and/or encounters for the requested time period, some results have not been displayed. A  complete set of results can be found in Results Review.   No results found.  Pending Labs Unresulted Labs (From admission, onward)     Start     Ordered   12/19/22 0500  Basic metabolic panel  Tomorrow morning,   R        12/18/22 1552   12/19/22 0500  CBC  Tomorrow morning,   R        12/18/22 1552   12/18/22 1551  HIV Antibody (routine testing w rflx)  (HIV Antibody (Routine testing w reflex) panel)  Once,   R        12/18/22 1552   12/18/22 1454  Expectorated Sputum Assessment w Gram Stain, Rflx to Resp Cult  Once,   R        12/18/22 1453   12/18/22 1445  Respiratory (~20 pathogens) panel by PCR  (Respiratory panel by PCR (~20 pathogens, ~24 hr TAT)  w precautions)  Add-on,   AD        12/18/22 1444   12/18/22 1444  Procalcitonin  Add-on,   AD       References:     Procalcitonin Lower Respiratory Tract Infection AND Sepsis Procalcitonin Algorithm   12/18/22 1443   12/18/22 1337  MRSA Next Gen by PCR, Nasal  (MRSA Screening)  Once,   URGENT        12/18/22 1336   12/18/22 1025  Blood culture (routine x 2)  BLOOD CULTURE X 2,   R (with STAT occurrences)      12/18/22 1024            Vitals/Pain Today's Vitals   12/18/22 1345 12/18/22 1400 12/18/22 1413 12/18/22 1415  BP: (!) 83/50 (!) 84/55  (!) 82/57  Pulse: 62 (!) 58  (!) 58  Resp: (!) 21 (!) 23  (!) 23  Temp:   98.6 F (37 C)   TempSrc:   Oral   SpO2: 94% 92%  96%  Weight:      Height:      PainSc:        Isolation Precautions Droplet precaution  Medications Medications  midodrine (PROAMATINE) tablet 15 mg (15 mg Oral Given 12/18/22 1214)  piperacillin-tazobactam (ZOSYN) IVPB 2.25 g (has no administration in time range)  vancomycin variable dose per unstable renal function (pharmacist dosing) (has no administration in time range)  mometasone-formoterol (DULERA) 200-5 MCG/ACT inhaler 2 puff (has no administration in time range)  albuterol (PROVENTIL) (2.5 MG/3ML) 0.083% nebulizer solution 2.5 mg (has no administration in time range)  heparin injection 5,000 Units (has no administration in time range)  acetaminophen (TYLENOL) tablet 650 mg (has no administration in time range)    Or  acetaminophen (TYLENOL) suppository 650 mg (has no administration in time range)  polyethylene glycol (MIRALAX / GLYCOLAX) packet 17 g (has no administration in time range)  sodium chloride 0.9 % bolus 250 mL (0 mLs Intravenous Stopped 12/18/22 1134)  cefTRIAXone (ROCEPHIN) 1 g in sodium chloride 0.9 % 100 mL IVPB (0 g Intravenous Stopped 12/18/22 1152)  doxycycline (VIBRAMYCIN) 100 mg in dextrose 5 % 250 mL IVPB (0 mg Intravenous Stopped 12/18/22 1357)  acetaminophen (TYLENOL) tablet 1,000 mg (1,000 mg Oral Given 12/18/22 1125)  sodium chloride 0.9 % bolus 250 mL (0 mLs Intravenous Stopped 12/18/22  1147)  sodium chloride 0.9 % bolus 250 mL (0 mLs Intravenous Stopped 12/18/22 1209)  ipratropium-albuterol (DUONEB) 0.5-2.5 (3) MG/3ML nebulizer solution 3 mL (3 mLs Nebulization Given 12/18/22 1321)  sodium chloride 0.9 % bolus 1,000 mL (1,000 mLs Intravenous New Bag/Given 12/18/22 1403)  vancomycin (VANCOCIN) IVPB 1000 mg/200 mL premix (1,000 mg Intravenous New Bag/Given 12/18/22 1415)    Mobility walks with device     Focused Assessments Pulmonary Assessment Handoff:  Lung sounds: Bilateral Breath Sounds: Rhonchi O2 Device: Room Air O2 Flow Rate (L/min): 3 L/min    R Recommendations: See Admitting Provider Note  Report given to:   Additional Notes:

## 2022-12-18 NOTE — Consult Note (Addendum)
NAME:  Tina Watkins, MRN:  [id number], DOB:  [birth year], LOS: 0 ADMISSION DATE:  12/18/2022, CONSULTATION DATE:  12/18/22 REFERRING MD:  Tina Watkins CHIEF COMPLAINT:  Aches, generalized weakness   History of Present Illness:  Tina Watkins is a 57 y.o. female who has a PMH as below including but not limited to ESRD on HD TTS (last Tuesday 11/12 and missed Thurs 11/14 due to illness), chronic hypotension with SBP in the 90s and on daily Midodrine, recent diagnosis lung CA (LUL nodule s/p navigational bronchoscopy biopsy on 11/16/22 with Dr. Delton Coombes with positive cytology, referred to medical oncology but has yet to see them), COPD, tobacco dependence, dCHF, A.fib on Eliquis, Cirrhosis, Pancreatic tal cystic lesion.  She presented to [facility] ED 11/15 with cough productive of yellow sputum, aches, generalized weakness and feeling bad x 1 week. Symptoms started mildly a week or so prior and persisted. She saw pulmonary as an outpatient on 11/11 and was prescribed Augmentin. She had COVID test performed at dialysis on 11/12 and also by her pain provider this week which were both negative. She denies any recent travel or exposure to known sick contacts. Denies any fevers/chills/sweats, N/V.  In ED, she was noted to have SBP in the 80s initially then later in the 70s. She re-iterated that her baseline SBP is in the 90s chronically. She mainly felt generalized weakness, sore throat, ongoing cough. COVID and flu A/B were negative as was GAS throat swab. CXR showed left sided PNA. CT chest pending.  Pertinent  Medical History:  has Gastroparesis; WEIGHT LOSS; NAUSEA AND VOMITING; History of renal transplantation; Septic shock (HCC); Acute on chronic kidney failure (HCC); Bacteremia due to Gram-negative bacteria; ESRD (end stage renal disease) (HCC); Lower urinary tract infectious disease; E coli bacteremia; Herpes infection; Anxiety; Chronic pain syndrome; Dehydration, mild; Pyelonephritis, acute; Sepsis (HCC);  History of kidney transplant; Muscle spasms of neck; Bleeding hemorrhoid; Anemia in chronic kidney disease; Urinary tract infectious disease; Cervical stenosis of spinal canal; Chest pain; Multifocal pneumonia; AKI (acute kidney injury) (HCC); Thrush, oral; 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; Tobacco abuse; Right leg swelling; Right leg pain; Stroke (cerebrum) (HCC); GERD (gastroesophageal reflux disease); Acute venous embolism and thrombosis of deep vessels of proximal lower extremity, right (HCC); Paresthesia; Neck pain; DDD (degenerative disc disease), cervical; Neuropathy; Swollen abdomen; Pneumonia due to COVID-19 virus; Hypotension; Symptomatic anemia; CHF (congestive heart failure) (HCC); Weakness; Pressure injury of skin; Chest congestion; Itching; HCAP (healthcare-associated pneumonia); Decubitus ulcer of sacral region, stage 1; Other pancytopenia (HCC); Chronic pain of both knees; Atrial flutter (HCC); Hypercoagulable state due to atrial fibrillation (HCC); Aortic atherosclerosis (HCC); Acute respiratory failure with hypoxia (HCC); Abnormal CT scan; Acute recurrent pansinusitis; Cervical radiculitis; Chills (without fever); Coagulation defect, unspecified (HCC); ETD (Eustachian tube dysfunction), bilateral; Bilateral shoulder pain; Bilateral hand swelling; Nausea & vomiting; Prolonged Q-T interval on ECG; Bilateral hip pain; Encounter for monitoring amiodarone therapy; Atrial fibrillation (HCC); COVID-19; and Pulmonary nodule 1 cm or greater in diameter on their problem list.  Significant Hospital Events: Including procedures, antibiotic start and stop dates in addition to other pertinent events   11/15 admit  Interim History / Subjective:  Comfortable on RA. Feels slightly better compared to when she first arrived. Main complaint currently is fatigue.  Objective:  Blood pressure (!) 83/50, pulse 62, temperature 98.6 F (37 C), temperature source Oral,  resp. rate (!) 21, height 4\' 11"  (1.499 m), weight 49.9 kg, SpO2 94%.  Intake/Output Summary (Last 24 hours) at 12/18/2022 1430 Last data filed at 12/18/2022 1357 Gross per 24 hour  Intake 1010.96 ml  Output --  Net 1010.96 ml   Filed Weights   12/18/22 1059  Weight: 49.9 kg    Examination: General: Adult female, resting in bed, in NAD. Neuro: A&O x 3. General fatigue but no focal weaknesses. HEENT: [place]/AT. Sclerae anicteric. R eye blindness. Cardiovascular: RRR, no M/R/G.  Lungs: Respirations even and unlabored.  CTA bilaterally, No W/R/R. Abdomen: BS x 4, soft, NT/ND.  Musculoskeletal: No gross deformities, no edema.  Skin: Intact, warm, no rashes.   Labs/imaging personally reviewed:  CT chest 11/15 >   Assessment/Plan:   Presumed left HCAP - CXR more suggestive of left sided PNA versus collapse/plugging.  - Vanc/Zosyn. - Follow cultures. - Add sputum culture, RVP, PCT. - Agree with CT chest to better characterize. - Bronchial hygiene.  Hx COPD with ongoing tobacco dependence. Elwin Sleight in lieu of PTA Advair. - PRN Albuterol. - Tobacco cessation counseling. - F/u in our office as an outpatient (next scheduled for 03/31/23).  Chronic hypotension - at risk for worsening with presumed HCAP. - Additional 1L fluids over 2 hours. - Continue PTA Midodrine.  ESRD on HD TTS - last dialyzed Tues 11/12. - Primary team to notify nephrology of admission. No urgent need for HD today.  Recently diagnosed LUL malignancy. - Needs to ensure follow up with medical oncology, has yet to see them.   Rest per primary team. Pt stable for admission under IMTS to progressive. No ICU needs at this time. Please call back if we can be of further assistance.  Best practice (evaluated daily):  Per primary.   Labs   CBC: Recent Labs  Lab 12/18/22 0923  WBC 8.2  HGB 8.7*  HCT 28.6*  MCV 93.8  PLT 225    Basic Metabolic Panel: Recent Labs  Lab 12/18/22 0923  NA 135   K 3.8  CL 95*  CO2 23  GLUCOSE 89  BUN 25*  CREATININE 9.30*  CALCIUM 9.2   GFR: Estimated Creatinine Clearance: 4.7 mL/min (A) (by C-G formula based on SCr of 9.3 mg/dL (H)). Recent Labs  Lab 12/18/22 0923  WBC 8.2    Liver Function Tests: Recent Labs  Lab 12/18/22 0923  AST 15  ALT 14  ALKPHOS 156*  BILITOT 1.0  PROT 6.3*  ALBUMIN 2.9*   No results for input(s): "LIPASE", "AMYLASE" in the last 168 hours. No results for input(s): "AMMONIA" in the last 168 hours.  ABG    Component Value Date/Time   PHART 7.425 03/23/2019 1020   PCO2ART 32.9 03/23/2019 1020   PO2ART 131.0 (H) 03/23/2019 1020   HCO3 22.8 03/23/2019 1034   TCO2 24 11/16/2022 0636   ACIDBASEDEF 2.0 03/23/2019 1034   O2SAT 77.0 03/23/2019 1034     Coagulation Profile: No results for input(s): "INR", "PROTIME" in the last 168 hours.  Cardiac Enzymes: Recent Labs  Lab 12/18/22 0923 12/18/22 1100  CKTOTAL 45 45    HbA1C: Hemoglobin A1C  Date/Time Value Ref Range Status  07/14/2021 08:49 AM 4.0 4.0 - 5.6 % Final  05/08/2019 01:47 PM 4.8 4.0 - 5.6 % Final   HbA1c, POC (prediabetic range)  Date/Time Value Ref Range Status  07/14/2021 08:49 AM 4.0 (A) 5.7 - 6.4 % Final   HbA1c, POC (controlled diabetic range)  Date/Time Value Ref Range Status  07/14/2021 08:49 AM 4.0 0.0 - 7.0 % Final   HbA1c  POC (<> result, manual entry)  Date/Time Value Ref Range Status  07/14/2021 08:49 AM 4.0 4.0 - 5.6 % Final   Hgb A1c MFr Bld  Date/Time Value Ref Range Status  12/10/2011 11:45 PM 5.5 <5.7 % Final    Comment:    (NOTE)                                                                       According to the ADA Clinical Practice Recommendations for 2011, when HbA1c is used as a screening test:  >=6.5%   Diagnostic of Diabetes Mellitus           (if abnormal result is confirmed) 5.7-6.4%   Increased risk of developing Diabetes Mellitus References:Diagnosis and Classification of Diabetes  [place],Diabetes Care,2011,34(Suppl 1):S62-S69 and Standards of Medical Care in         Diabetes - 2011,Diabetes Care,2011,34 (Suppl 1):S11-S61.    CBG: No results for input(s): "GLUCAP" in the last 168 hours.  Review of Systems:   All negative; except for those that are bolded, which indicate positives.  Constitutional: weight loss, weight gain, night sweats, fevers, chills, fatigue, weakness.  HEENT: headaches, sore throat, sneezing, nasal congestion, post nasal drip, difficulty swallowing, tooth/dental problems, visual complaints, visual changes, ear aches. Neuro: difficulty with speech, weakness, numbness, ataxia. CV:  chest pain, orthopnea, PND, swelling in lower extremities, dizziness, palpitations, syncope.  Resp: cough, hemoptysis, dyspnea, wheezing. GI: heartburn, indigestion, abdominal pain, nausea, vomiting, diarrhea, constipation, change in bowel habits, loss of appetite, hematemesis, melena, hematochezia.  GU: dysuria, change in color of urine, urgency or frequency, flank pain, hematuria. MSK: joint pain or swelling, decreased range of motion. Psych: change in mood or affect, depression, anxiety, suicidal ideations, homicidal ideations. Skin: rash, itching, bruising.   Past Medical History:  She,  has a past medical history of Anxiety, Bacteremia due to Gram-negative bacteria (05/23/2011), Blind, Blind right eye, CHF (congestive heart failure) (HCC), Chronic lower back pain, Complication of anesthesia, DDD (degenerative disc disease), cervical, Depression, Dysrhythmia, E coli bacteremia (06/18/2011), Elevated LDL cholesterol level (12/2018), ESRD (end stage renal disease) (HCC) (06/12/2011), Fibromyalgia, Gastroparesis, GERD (gastroesophageal reflux disease), Glaucoma, Gout, H/O carpal tunnel syndrome, Headache (10/2019), Headache(784.0), Herpes genitalia (1994), History of blood transfusion, History of stomach ulcers, Hypotension, Iron deficiency anemia, New onset a-fib (HCC),  Osteopenia, Peripheral neuropathy (11/2018), Pneumonia, Pressure ulcer of sacral region, stage 1 (07/2019), Seizures (HCC) (1994), Spinal stenosis in cervical region, Stroke [facility]), Stroke (HCC) (~ 1999; 2001), and Vitamin D deficiency (12/2018).   Surgical History:   Past Surgical History:  Procedure Laterality Date   ANTERIOR CERVICAL DECOMP/DISCECTOMY FUSION N/A 01/08/2015   Procedure: Anterior Cervical Three-Four/Four-Five Decompression/Diskectomy/Fusion;  Surgeon: Hilda Lias, MD;  Location: [facility] NEURO ORS;  Service: Neurosurgery;  Laterality: N/A;  C3-4 C4-5 Anterior cervical decompression/diskectomy/fusion   APPENDECTOMY  ~ 2004   BIOPSY  09/12/2020   Procedure: BIOPSY;  Surgeon: Rachael Fee, MD;  Location: [facility] ENDOSCOPY;  Service: Endoscopy;;   BRONCHIAL BIOPSY  11/16/2022   Procedure: BRONCHIAL BIOPSIES;  Surgeon: Leslye Peer, MD;  Location: [facility] ENDOSCOPY;  Service: Pulmonary;;   BRONCHIAL BRUSHINGS  11/16/2022   Procedure: BRONCHIAL BRUSHINGS;  Surgeon: Leslye Peer, MD;  Location: [facility] ENDOSCOPY;  Service: Pulmonary;;  BRONCHIAL NEEDLE ASPIRATION BIOPSY  11/16/2022   Procedure: BRONCHIAL NEEDLE ASPIRATION BIOPSIES;  Surgeon: Leslye Peer, MD;  Location: [facility] ENDOSCOPY;  Service: Pulmonary;;   CATARACT EXTRACTION     right eye   COLONOSCOPY     COLONOSCOPY WITH PROPOFOL N/A 09/12/2020   Procedure: COLONOSCOPY WITH PROPOFOL;  Surgeon: Rachael Fee, MD;  Location: [facility] ENDOSCOPY;  Service: Endoscopy;  Laterality: N/A;   ENUCLEATION  2001   "right"   ESOPHAGOGASTRODUODENOSCOPY (EGD) WITH PROPOFOL N/A 04/21/2012   Procedure: ESOPHAGOGASTRODUODENOSCOPY (EGD) WITH PROPOFOL;  Surgeon: Rachael Fee, MD;  Location: [facility] ENDOSCOPY;  Service: Endoscopy;  Laterality: N/A;   ESOPHAGOGASTRODUODENOSCOPY (EGD) WITH PROPOFOL N/A 09/12/2020   Procedure: ESOPHAGOGASTRODUODENOSCOPY (EGD) WITH PROPOFOL;  Surgeon: Rachael Fee, MD;  Location: [facility] ENDOSCOPY;  Service: Endoscopy;  Laterality:  N/A;   FIDUCIAL MARKER PLACEMENT  11/16/2022   Procedure: FIDUCIAL MARKER PLACEMENT;  Surgeon: Leslye Peer, MD;  Location: [facility] ENDOSCOPY;  Service: Pulmonary;;   HEMOSTASIS CONTROL  11/16/2022   Procedure: HEMOSTASIS CONTROL;  Surgeon: Leslye Peer, MD;  Location: [facility] ENDOSCOPY;  Service: Pulmonary;;   INSERTION OF DIALYSIS CATHETER  1988   "AV graft LUA & LFA; LUA worked for 1 day; LFA never worked"   IR FLUORO GUIDE CV LINE RIGHT  07/11/2019   IR FLUORO GUIDE CV LINE RIGHT  10/20/2019   IR FLUORO GUIDE CV LINE RIGHT  05/31/2020   IR PTA VENOUS EXCEPT DIALYSIS CIRCUIT  05/31/2020   IR RADIOLOGY PERIPHERAL GUIDED IV START  07/11/2019   IR US GUIDE VASC ACCESS RIGHT  07/11/2019   IR US GUIDE VASC ACCESS RIGHT  07/11/2019   IR US GUIDE VASC ACCESS RIGHT  07/11/2019   IR VENOCAVAGRAM IVC  05/31/2020   KIDNEY TRANSPLANT  1994; 1999; 2005   "right"   MULTIPLE TOOTH EXTRACTIONS     POLYPECTOMY  09/12/2020   Procedure: POLYPECTOMY;  Surgeon: Rachael Fee, MD;  Location: [facility] ENDOSCOPY;  Service: Endoscopy;;   RIGHT HEART CATH N/A 03/23/2019   Procedure: RIGHT HEART CATH;  Surgeon: Dolores Patty, MD;  Location: [facility] INVASIVE CV LAB;  Service: Cardiovascular;  Laterality: N/A;   TONSILLECTOMY     TOTAL NEPHRECTOMY  1988?; 1994; 2005   VIDEO BRONCHOSCOPY WITH RADIAL ENDOBRONCHIAL ULTRASOUND  11/16/2022   Procedure: VIDEO BRONCHOSCOPY WITH RADIAL ENDOBRONCHIAL ULTRASOUND;  Surgeon: Leslye Peer, MD;  Location: [facility] ENDOSCOPY;  Service: Pulmonary;;     Social History:   reports that she has been smoking cigarettes. She has a 52.5 pack-year smoking history. She has never been exposed to tobacco smoke. She has never used smokeless tobacco. She reports that she does not currently use alcohol. She reports current drug use. Frequency: 1.00 time per week. Drug: Marijuana.   Family History:  Her family history includes Diabetes in her maternal uncle; Glaucoma in her mother; Hypertension in her maternal  grandmother; Multiple sclerosis in her brother; Pancreatic cancer in her father. There is no history of Breast cancer.   Allergies Allergies  Allergen Reactions   Levofloxacin Itching and Rash   Tape Other (See Comments)    "Certain surgical tapes peel off my skin"     Home Medications  Prior to Admission medications   Medication Sig Start Date End Date Taking? Authorizing Provider  allopurinol (ZYLOPRIM) 100 MG tablet Take 1 tablet (100 mg total) by mouth daily. 08/31/22  Yes Ivonne Andrew, NP  amiodarone (PACERONE) 200 MG tablet Take 0.5 tablets (100 mg total)  by mouth daily. 07/03/22  Yes Eustace Pen, PA-C  amoxicillin-clavulanate (AUGMENTIN) 875-125 MG tablet Take 1 tablet by mouth 2 (two) times daily. 12/14/22  Yes Glenford Bayley, NP  apixaban (ELIQUIS) 2.5 MG TABS tablet Take 1 tablet (2.5 mg total) by mouth 2 (two) times daily. Okay to restart this medication on 11/18/2022 11/16/22  Yes Leslye Peer, MD  Colchicine 0.6 MG CAPS Take 0.6 mg by mouth daily as needed (gout flare up). 01/12/22  Yes Ivonne Andrew, NP  cyclobenzaprine (FLEXERIL) 5 MG tablet Take 1 tablet (5 mg total) by mouth at bedtime as needed. 08/21/22  Yes Rice, Jamesetta Orleans, MD  diclofenac Sodium (VOLTAREN) 1 % GEL Apply 4 g topically as needed. 06/22/22  Yes Ivonne Andrew, NP  diphenhydrAMINE (BENADRYL) 25 mg capsule Take 1 capsule (25 mg total) by mouth every 8 (eight) hours as needed. 07/21/19  Yes Kallie Locks, FNP  dorzolamide-timolol (COSOPT) 2-0.5 % ophthalmic solution Place 1 drop into the left eye 2 (two) times daily. 12/13/21  Yes [provider]  escitalopram (LEXAPRO) 20 MG tablet Take 20 mg by mouth daily. 03/21/21  Yes [provider]  famotidine (PEPCID) 20 MG tablet TAKE 1 TABLET BY MOUTH TWICE A DAY 03/10/22  Yes Ivonne Andrew, NP  fluticasone-salmeterol (ADVAIR HFA) 115-21 MCG/ACT inhaler Inhale 2 puffs into the lungs 2 (two) times daily. 04/28/21  Yes Martina Sinner, MD  gabapentin (NEURONTIN) 100 MG capsule Take 100 mg by mouth 3 (three) times daily as needed (nerve pain).   Yes [provider]  lidocaine (LIDODERM) 5 % Place 1 patch onto the skin daily as needed (for pain- remove old patch first).  10/28/18  Yes [provider]  metoCLOPramide (REGLAN) 10 MG tablet Take 1 tablet (10 mg total) by mouth 3 (three) times daily before meals. 12/02/22  Yes Meryl Dare, MD  midodrine (PROAMATINE) 10 MG tablet Take 1.5 tablets (15 mg total) by mouth 3 (three) times daily. NEEDS FOLLOW UP APPOINTMENT FOR ANYMORE REFILLS 05/11/22  Yes Milford, Anderson Malta, FNP  omeprazole (PRILOSEC) 40 MG capsule TAKE 1 CAPSULE (40 MG TOTAL) BY MOUTH DAILY. 04/17/22  Yes Nandigam, Eleonore Chiquito, MD  ondansetron (ZOFRAN-ODT) 8 MG disintegrating tablet Take 8 mg by mouth as needed for nausea or vomiting.   Yes [provider]  oxyCODONE-acetaminophen (PERCOCET) 10-325 MG tablet Take 1 tablet by mouth 4 (four) times daily as needed for pain. 09/08/19  Yes [provider]  VELPHORO 500 MG chewable tablet Chew 500 mg by mouth 3 (three) times daily.   Yes [provider]  zolpidem (AMBIEN) 10 MG tablet Take 1 tablet (10 mg total) by mouth at bedtime as needed for sleep. 08/24/19  Yes Sherryll Burger, Pratik D, DO  alendronate (FOSAMAX) 70 MG tablet Take 70 mg by mouth once a week. Patient not taking: Reported on 12/18/2022 12/16/22   [provider]     Rutherford Guys, PA - [facility] For pager details, please see AMION or use Epic chat  After 1900, please call [facility] for cross coverage needs 12/18/2022, 2:30 PM

## 2022-12-18 NOTE — ED Notes (Signed)
Notified Dr Jearld Fenton of pt's hypotension. The ordered bolus is already going on pump. I was instructed that if bp does not increase then to give another bolus of 250 ml of fluids.

## 2022-12-18 NOTE — ED Notes (Signed)
I looked for a vein, but was unsuccessful. IV team consult placed

## 2022-12-18 NOTE — Consult Note (Signed)
Renal Service Consult Note [facility]  Tina Watkins 12/18/2022 Tina Krabbe, MD Requesting Physician: Dr. Sol Watkins  Reason for Consult: ESRD pt w/ cough and L sided infiltrates on CXR HPI: The patient is a 57 y.o. year-old w/ PMH as below who presented to ED c/o cough, SOB and feeling "bad" for last 3-4 days. Joints aching. In ED BP's were in the 70s and she rec'd IV bolus x 1.7L total. Usual BP's are in the 90s. Does not wear O2 at home. In ED has 2L [place]. CXR showing large sided infiltrates. Suspected PNA. Pt admitted. We are asked to see for dialysis.   Last HD Tuesday, was sick and didn't get HD yesterday. No HD cath issues. Feels dry, doesn't want to pull any fluid w/ HD.   ROS - denies CP, no joint pain, no HA, no blurry vision, no rash, no diarrhea, no nausea/ vomiting   Past Medical History  Past Medical History:  Diagnosis Date   Anxiety    Bacteremia due to Gram-negative bacteria 05/23/2011   Blind    right eye   Blind right eye    CHF (congestive heart failure) (HCC)    Chronic lower back pain    Complication of anesthesia    "woke up during OR; I have an extremely high tolerance" (12/11/2011) 1 procedure was graft; the other procedures were procedures that are typically done with sedation.   DDD (degenerative disc disease), cervical    Depression    Dysrhythmia    "tachycardia" (12/11/2011) new onset afib 10/15/14 EKG   E coli bacteremia 06/18/2011   Elevated LDL cholesterol level 12/2018   ESRD (end stage renal disease) (HCC) 06/12/2011   Tues-Thurs-Sat dialysis   Fibromyalgia    Gastroparesis    GERD (gastroesophageal reflux disease)    Glaucoma    right eye   Gout    H/O carpal tunnel syndrome    Headache 10/2019   no current problem per patient on 11/12/22   Headache(784.0)    "not often anymore" (12/11/2011)   Herpes genitalia 1994   History of blood transfusion    "more than a few times" (12/11/2011)   History of stomach ulcers     Hypotension    Iron deficiency anemia    New onset a-fib (HCC)    10/15/14 EKG   Osteopenia    Peripheral neuropathy 11/2018   Pneumonia    x several but years ago   Pressure ulcer of sacral region, stage 1 07/2019   Seizures (HCC) 1994   "post transplant; only have had that one" (12/11/2011)   Spinal stenosis in cervical region    Stroke [facility])     left basal ganglia lacunar infarct; Right frontal lobe lacunar infarct.   Stroke [facility]) ~ 1999; 2001   "briefly lost my vision; lost my right eye" (12/11/2011)   Vitamin D deficiency 12/2018   Past Surgical History  Past Surgical History:  Procedure Laterality Date   ANTERIOR CERVICAL DECOMP/DISCECTOMY FUSION N/A 01/08/2015   Procedure: Anterior Cervical Three-Four/Four-Five Decompression/Diskectomy/Fusion;  Surgeon: Hilda Lias, MD;  Location: [facility] NEURO ORS;  Service: Neurosurgery;  Laterality: N/A;  C3-4 C4-5 Anterior cervical decompression/diskectomy/fusion   APPENDECTOMY  ~ 2004   BIOPSY  09/12/2020   Procedure: BIOPSY;  Surgeon: Rachael Fee, MD;  Location: [facility] ENDOSCOPY;  Service: Endoscopy;;   BRONCHIAL BIOPSY  11/16/2022   Procedure: BRONCHIAL BIOPSIES;  Surgeon: Leslye Peer, MD;  Location: [facility] ENDOSCOPY;  Service: Pulmonary;;  BRONCHIAL BRUSHINGS  11/16/2022   Procedure: BRONCHIAL BRUSHINGS;  Surgeon: Leslye Peer, MD;  Location: [facility] ENDOSCOPY;  Service: Pulmonary;;   BRONCHIAL NEEDLE ASPIRATION BIOPSY  11/16/2022   Procedure: BRONCHIAL NEEDLE ASPIRATION BIOPSIES;  Surgeon: Leslye Peer, MD;  Location: [facility] ENDOSCOPY;  Service: Pulmonary;;   CATARACT EXTRACTION     right eye   COLONOSCOPY     COLONOSCOPY WITH PROPOFOL N/A 09/12/2020   Procedure: COLONOSCOPY WITH PROPOFOL;  Surgeon: Rachael Fee, MD;  Location: [facility] ENDOSCOPY;  Service: Endoscopy;  Laterality: N/A;   ENUCLEATION  2001   "right"   ESOPHAGOGASTRODUODENOSCOPY (EGD) WITH PROPOFOL N/A 04/21/2012   Procedure: ESOPHAGOGASTRODUODENOSCOPY (EGD) WITH PROPOFOL;   Surgeon: Rachael Fee, MD;  Location: [facility] ENDOSCOPY;  Service: Endoscopy;  Laterality: N/A;   ESOPHAGOGASTRODUODENOSCOPY (EGD) WITH PROPOFOL N/A 09/12/2020   Procedure: ESOPHAGOGASTRODUODENOSCOPY (EGD) WITH PROPOFOL;  Surgeon: Rachael Fee, MD;  Location: [facility] ENDOSCOPY;  Service: Endoscopy;  Laterality: N/A;   FIDUCIAL MARKER PLACEMENT  11/16/2022   Procedure: FIDUCIAL MARKER PLACEMENT;  Surgeon: Leslye Peer, MD;  Location: [facility] ENDOSCOPY;  Service: Pulmonary;;   HEMOSTASIS CONTROL  11/16/2022   Procedure: HEMOSTASIS CONTROL;  Surgeon: Leslye Peer, MD;  Location: [facility] ENDOSCOPY;  Service: Pulmonary;;   INSERTION OF DIALYSIS CATHETER  1988   "AV graft LUA & LFA; LUA worked for 1 day; LFA never worked"   IR FLUORO GUIDE CV LINE RIGHT  07/11/2019   IR FLUORO GUIDE CV LINE RIGHT  10/20/2019   IR FLUORO GUIDE CV LINE RIGHT  05/31/2020   IR PTA VENOUS EXCEPT DIALYSIS CIRCUIT  05/31/2020   IR RADIOLOGY PERIPHERAL GUIDED IV START  07/11/2019   IR US GUIDE VASC ACCESS RIGHT  07/11/2019   IR US GUIDE VASC ACCESS RIGHT  07/11/2019   IR US GUIDE VASC ACCESS RIGHT  07/11/2019   IR VENOCAVAGRAM IVC  05/31/2020   KIDNEY TRANSPLANT  1994; 1999; 2005   "right"   MULTIPLE TOOTH EXTRACTIONS     POLYPECTOMY  09/12/2020   Procedure: POLYPECTOMY;  Surgeon: Rachael Fee, MD;  Location: [facility] ENDOSCOPY;  Service: Endoscopy;;   RIGHT HEART CATH N/A 03/23/2019   Procedure: RIGHT HEART CATH;  Surgeon: Dolores Patty, MD;  Location: [facility] INVASIVE CV LAB;  Service: Cardiovascular;  Laterality: N/A;   TONSILLECTOMY     TOTAL NEPHRECTOMY  1988?; 1994; 2005   VIDEO BRONCHOSCOPY WITH RADIAL ENDOBRONCHIAL ULTRASOUND  11/16/2022   Procedure: VIDEO BRONCHOSCOPY WITH RADIAL ENDOBRONCHIAL ULTRASOUND;  Surgeon: Leslye Peer, MD;  Location: [facility] ENDOSCOPY;  Service: Pulmonary;;   Family History  Family History  Problem Relation Age of Onset   Glaucoma Mother    Pancreatic cancer Father    Multiple sclerosis Brother     Diabetes Maternal Uncle    Hypertension Maternal Grandmother    Breast cancer Neg Hx    Social History  reports that she has been smoking cigarettes. She has a 52.5 pack-year smoking history. She has never been exposed to tobacco smoke. She has never used smokeless tobacco. She reports that she does not currently use alcohol. She reports current drug use. Frequency: 1.00 time per week. Drug: Marijuana. Allergies  Allergies  Allergen Reactions   Levofloxacin Itching and Rash   Tape Other (See Comments)    "Certain surgical tapes peel off my skin"   Home medications Prior to Admission medications   Medication Sig Start Date End Date Taking? Authorizing Provider  allopurinol (ZYLOPRIM) 100 MG tablet Take 1  tablet (100 mg total) by mouth daily. 08/31/22  Yes Ivonne Andrew, NP  amiodarone (PACERONE) 200 MG tablet Take 0.5 tablets (100 mg total) by mouth daily. 07/03/22  Yes Eustace Pen, PA-C  amoxicillin-clavulanate (AUGMENTIN) 875-125 MG tablet Take 1 tablet by mouth 2 (two) times daily. 12/14/22  Yes Glenford Bayley, NP  apixaban (ELIQUIS) 2.5 MG TABS tablet Take 1 tablet (2.5 mg total) by mouth 2 (two) times daily. Okay to restart this medication on 11/18/2022 11/16/22  Yes Leslye Peer, MD  Colchicine 0.6 MG CAPS Take 0.6 mg by mouth daily as needed (gout flare up). 01/12/22  Yes Ivonne Andrew, NP  cyclobenzaprine (FLEXERIL) 5 MG tablet Take 1 tablet (5 mg total) by mouth at bedtime as needed. 08/21/22  Yes Rice, Jamesetta Orleans, MD  diclofenac Sodium (VOLTAREN) 1 % GEL Apply 4 g topically as needed. 06/22/22  Yes Ivonne Andrew, NP  diphenhydrAMINE (BENADRYL) 25 mg capsule Take 1 capsule (25 mg total) by mouth every 8 (eight) hours as needed. 07/21/19  Yes Kallie Locks, FNP  dorzolamide-timolol (COSOPT) 2-0.5 % ophthalmic solution Place 1 drop into the left eye 2 (two) times daily. 12/13/21  Yes [provider]  escitalopram (LEXAPRO) 20 MG tablet Take 20 mg by mouth  daily. 03/21/21  Yes [provider]  famotidine (PEPCID) 20 MG tablet TAKE 1 TABLET BY MOUTH TWICE A DAY 03/10/22  Yes Ivonne Andrew, NP  fluticasone-salmeterol (ADVAIR HFA) 115-21 MCG/ACT inhaler Inhale 2 puffs into the lungs 2 (two) times daily. 04/28/21  Yes Martina Sinner, MD  gabapentin (NEURONTIN) 100 MG capsule Take 100 mg by mouth 3 (three) times daily as needed (nerve pain).   Yes [provider]  lidocaine (LIDODERM) 5 % Place 1 patch onto the skin daily as needed (for pain- remove old patch first).  10/28/18  Yes [provider]  metoCLOPramide (REGLAN) 10 MG tablet Take 1 tablet (10 mg total) by mouth 3 (three) times daily before meals. 12/02/22  Yes Meryl Dare, MD  midodrine (PROAMATINE) 10 MG tablet Take 1.5 tablets (15 mg total) by mouth 3 (three) times daily. NEEDS FOLLOW UP APPOINTMENT FOR ANYMORE REFILLS 05/11/22  Yes Milford, Anderson Malta, FNP  omeprazole (PRILOSEC) 40 MG capsule TAKE 1 CAPSULE (40 MG TOTAL) BY MOUTH DAILY. 04/17/22  Yes Nandigam, Eleonore Chiquito, MD  ondansetron (ZOFRAN-ODT) 8 MG disintegrating tablet Take 8 mg by mouth as needed for nausea or vomiting.   Yes [provider]  oxyCODONE-acetaminophen (PERCOCET) 10-325 MG tablet Take 1 tablet by mouth 4 (four) times daily as needed for pain. 09/08/19  Yes [provider]  VELPHORO 500 MG chewable tablet Chew 500 mg by mouth 3 (three) times daily.   Yes [provider]  zolpidem (AMBIEN) 10 MG tablet Take 1 tablet (10 mg total) by mouth at bedtime as needed for sleep. 08/24/19  Yes Sherryll Burger, Pratik D, DO  alendronate (FOSAMAX) 70 MG tablet Take 70 mg by mouth once a week. Patient not taking: Reported on 12/18/2022 12/16/22   [provider]     Vitals:   12/18/22 1530 12/18/22 1545 12/18/22 1600 12/18/22 1615  BP: (!) 86/54 (!) 85/69 (!) 85/49 (!) 104/55  Pulse: 61 64 (!) 57 (!) 59  Resp: 17 19 20 19   Temp:      TempSrc:      SpO2: 99% 97% 99% 98%  Weight:       Height:  Exam Gen alert, no distress No rash, cyanosis or gangrene Sclera anicteric, throat clear  No jvd or bruits Chest L sided crackles diffusely, R mostly clear  RRR no RG Abd soft ntnd no mass or ascites +bs GU defer MS no joint effusions or deformity Ext no LE or UE edema, no wounds or ulcers Neuro is alert, Ox 3 , nf    R fem TDC in place     OP HD: TTS G-O  4h  400/700  48.9kg   2/2 bath  R fem TDC   Heparin 2000  - last HD 11/12, post wt 49.3kg - rocaltrol 2.75 mcg - venofer 50 mg weekly iv    Assessment/ Plan: Suspected L HCAP - w/ coughing at home, sig CXR findings on L side. IV abx per pmd.  H/o COPD/ tobacco use ESKD - on HD TTS. Last HD Tuesday. Plan HD tonight.  Chronic hypotension - cont home midodrine. Sp 1.7 L in ED. During outpt HD the BP threshold is sbp of 85. Volume - euvolemic on exam, no edema by CXR. Keep even.  Anemia of eskd - Hb 8.7, follow.  MBD ckd - CCa in range. Add on phos. Cont binders w/ meals.      Vinson Moselle  MD [facility] 12/18/2022, 4:22 PM  Recent Labs  Lab 12/18/22 0923  HGB 8.7*  ALBUMIN 2.9*  CALCIUM 9.2  CREATININE 9.30*  K 3.8   Inpatient medications:  heparin  5,000 Units Subcutaneous Q8H   midodrine  15 mg Oral TID WC   mometasone-formoterol  2 puff Inhalation BID   vancomycin variable dose per unstable renal function (pharmacist dosing)   Does not apply See admin instructions    piperacillin-tazobactam (ZOSYN)  IV     acetaminophen **OR** acetaminophen, albuterol, polyethylene glycol

## 2022-12-18 NOTE — ED Triage Notes (Addendum)
Pt. Stated, Ive had sore throat, cough, and some SOB and feeling bad. This started on Monday. I went to dialysis and they did a COVID test on Tuesday. I went to my pain management Dr on [address]. And they did a COVID test and it was negative. Im achy all over and my joints.

## 2022-12-18 NOTE — Progress Notes (Signed)
Pharmacy Antibiotic Note  Tina Watkins is a 57 y.o. female for which pharmacy has been consulted for vancomycin and zosyn dosing for pneumonia.  Patient with a history of ESRD on HD TTS (last completed 11/12), chronic hypotension on midodrine, lung cancer, COPD, HF, AF on eliquis, cirrhosis, pancreatic lesion. Patient presenting with generalized weakness.  Rocephin, doxycline, and 1g of vancomycin given in the ED 11/15.  WBC 8.2; T 98.6; HR 58; RR 23 COVID neg / flu neg  Plan: Zosyn 2.25g q8h Pharmacy to enter vancomycin dosing pending dialysis planning Monitor WBC, fever, renal function, cultures De-escalate when able F/u Nephrology plan  Height: 4\' 11"  (149.9 cm) Weight: 49.9 kg (110 lb 0.2 oz) IBW/kg (Calculated) : 43.2  Temp (24hrs), Avg:98.1 F (36.7 C), Min:97.8 F (36.6 C), Max:98.6 F (37 C)  Recent Labs  Lab 12/18/22 0923  WBC 8.2  CREATININE 9.30*    Estimated Creatinine Clearance: 4.7 mL/min (A) (by C-G formula based on SCr of 9.3 mg/dL (H)).    Allergies  Allergen Reactions   Levofloxacin Itching and Rash   Tape Other (See Comments)    "Certain surgical tapes peel off my skin"   Microbiology results: Pending  Thank you for allowing pharmacy to be a part of this patient's care.  Delmar Landau, PharmD, BCPS 12/18/2022 2:53 PM ED Clinical Pharmacist -  [phone]

## 2022-12-18 NOTE — Hospital Course (Addendum)
Pain has worsened, not completely controlled with the current pain med. Pain recurs after 2 hrs. He is opoid naive. He is open to staying one more day for pain control.      57 year old female with PNA  Radiology read a lobar collaspe.  She recently hd a bronch 3 weeks ago to evaluate a nodiualy that was malignant.  She has has not seen oncology Follwooing pulm  Presented with mylagias, ST cough,  Cxr looks bad.  Hypoensive. Diaylsiis patient.  Takes 15 of midodrin TID   Basline is hih 80s to 90s systolic     Tina Watkins she arrived she was low into the systolics in the  70s.  ICU came and said that she okay for the floor.   Under 2L total. [place]certz  Patient reports that she has been having a cough, sore throat and myalgias for about 5 days now. She states that it started in Monday, She states that she is getting better. She was at her worst on Wednesday. She denies any fever and have not measured any temp. Diaylsis TThS. She denies any lightheadness, or dizzy. She did miss dialysis on Thursday. She reports having shortness of breath as well. She states that the shortness of breath is not getting better. She denies any chest pain. She states having new back pain since Monday. She states that it is at the bottom around her pelvic bone. She states that the pain is in the middle. She denies any nausea, vomiting, but does report having some diarrhea that started ion wednesday. She does make a little bit of urine. She has not been eating or drinking well/ She states she does not have any appetite since all of this started. She states that she did try some chicken noodle soup and some grits. Her cough is productive with think yellow. Patient reports that she recently had a nodule that was biopsied with a bronch. No illness prior to monday and felt great.   Social:  Substance: Stopped smoking yesterday, in the process of stopping, smoking for 10 years, rarely alcohol use, uses mariajuana Lives with:  Tina Watkins  PCP:  Angus Seller Support: Good support,NP  Occupation: N/A, never worked   Medical:  Stroke in the past, COPD, afib, macular degeneration, gout, gastroparesis,    Allergies: Levaquin   Family: Cancer, Diabetes, HTN   Surgery:   Meds:  Allopurinol 100 mg daily  Amiodarone 100 mg daily  Eliquis 2.5 mg BID Colchicine 0.6 mg daily prn  Flexeril 5 mg prn  Voltaren gel Benadryl COSOPT eyes drops daily   Lexapro 20 mg daily  Pepcid 20 mg BID Advair 2 puffs BID Gabapentin 100 mg TID Lidocaine patch pprn  Reglan 10 mg in morning  Midodrine 15 mg TID Omeprazole 40 mg daily  Zofran 8 mg prn  Percocet 10-325 mg q4h Velphoro 500 mg TID with snacks  Ambien 10 prn  Fosamax 70 once weekly    Code: Full code    Summary  Tina Watkins is a 57 y.o. female with pertinent PMH of recent LUL malignant nodule, ESRD on HD, anemia of chronic kidney disease who presented with dyspnea and productive cough and was admitted for acute hypoxic respiratory failure likely due to post-obstructive pneumonia.    Acute hypoxic respiratory failure  Likely Post-obstructive PNA with LUL consolidation and pleural effusion Malignant lung nodule of LUL [name] presented to [facility], ED complaining of dyspnea and upper respiratory symptoms for 5 days.  There she was found to be mildly hypoxic at 88% on room air.  Chest imaging revealed significant left upper lobe consolidation and pleural effusion with possible collapse initiating concern for postobstructive pneumonia in the setting of a known malignant lung mass.  She was started on broad-spectrum antibiotics.   ....   Acute on Chronic Hypotension Reduced fluid intake this week due to illness likely lead to hypovolemia.  That said, she reports systolic pressures are typically in the 80-90s, which she has returned to. Not currently concerned for septic shock as she is at her BP baseline, afebrile, and mentation is intact -  Continue home midodrine 15 mg TID

## 2022-12-18 NOTE — ED Provider Notes (Signed)
[place] EMERGENCY DEPARTMENT AT [facility] Provider Note   CSN: [id number] Arrival date & time: 12/18/22  4098     History  Chief Complaint  Patient presents with   Cough   Sore Throat   Shortness of Breath   Generalized Body Aches    Tina Watkins is a 57 y.o. female with PMH as listed below who presents with cough, sore throat, diffuse myalgias/body aches, poor PO intake, and generalized malaise since Tuesday. Last dialysis Tuesday, got a full session. Didn't go to dialysis yesterday because felt so sick. Covid test was negative on Wednesday. No sick contacts. No f/c. Does endorse mild SOB and chest pain with coughing and otherwise, rated 8/10. Also endorses gout in L great toe. Initial BP in triage is 83/51, then 71/47. Patient states her BP typically runs low, normally in low 90s systolic. Takes midodrine at home. 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 Patient on 2L  for sat 88% on RA.   Past Medical History:  Diagnosis Date   Anxiety    Bacteremia due to Gram-negative bacteria 05/23/2011   Blind    right eye   Blind right eye    CHF (congestive heart failure) (HCC)    Chronic lower back pain    Complication of anesthesia    "woke up during OR; I have an extremely high tolerance" (12/11/2011) 1 procedure was graft; the other procedures were procedures that are typically done with sedation.   DDD (degenerative disc disease), cervical    Depression    Dysrhythmia    "tachycardia" (12/11/2011) new onset afib 10/15/14 EKG   E coli bacteremia 06/18/2011   Elevated LDL cholesterol level 12/2018   ESRD (end stage renal disease) (HCC) 06/12/2011   Tues-Thurs-Sat dialysis   Fibromyalgia    Gastroparesis    GERD (gastroesophageal reflux disease)    Glaucoma    right eye   Gout    H/O carpal tunnel syndrome    Headache 10/2019   no current problem  per patient on 11/12/22   Headache(784.0)    "not often anymore" (12/11/2011)   Herpes genitalia 1994   History of blood transfusion    "more than a few times" (12/11/2011)   History of stomach ulcers    Hypotension    Iron deficiency anemia    New onset a-fib (HCC)    10/15/14 EKG   Osteopenia    Peripheral neuropathy 11/2018   Pneumonia    x several but years ago   Pressure ulcer of sacral region, stage 1 07/2019   Seizures (HCC) 1994   "post transplant; only have had that one" (12/11/2011)   Spinal stenosis in cervical region    Stroke [facility])     left basal ganglia lacunar infarct; Right frontal lobe lacunar infarct.   Stroke [facility]) ~ 1999; 2001   "briefly lost my vision; lost my right eye" (12/11/2011)   Vitamin D deficiency 12/2018       Home Medications Prior to Admission medications   Medication Sig Start Date End Date Taking? Authorizing Provider  allopurinol (ZYLOPRIM) 100 MG tablet Take 1 tablet (100 mg total) by mouth daily. 08/31/22  Yes Ivonne Andrew, NP  amiodarone (PACERONE) 200 MG tablet Take 0.5 tablets (100 mg total) by mouth daily. 07/03/22  Yes Eustace Pen, PA-C  amoxicillin-clavulanate (AUGMENTIN) 875-125 MG tablet Take 1 tablet by  mouth 2 (two) times daily. 12/14/22  Yes Glenford Bayley, NP  apixaban (ELIQUIS) 2.5 MG TABS tablet Take 1 tablet (2.5 mg total) by mouth 2 (two) times daily. Okay to restart this medication on 11/18/2022 11/16/22  Yes Leslye Peer, MD  Colchicine 0.6 MG CAPS Take 0.6 mg by mouth daily as needed (gout flare up). 01/12/22  Yes Ivonne Andrew, NP  cyclobenzaprine (FLEXERIL) 5 MG tablet Take 1 tablet (5 mg total) by mouth at bedtime as needed. 08/21/22  Yes Rice, Jamesetta Orleans, MD  diclofenac Sodium (VOLTAREN) 1 % GEL Apply 4 g topically as needed. 06/22/22  Yes Ivonne Andrew, NP  diphenhydrAMINE (BENADRYL) 25 mg capsule Take 1 capsule (25 mg total) by mouth every 8 (eight) hours as needed. 07/21/19  Yes Kallie Locks, FNP   dorzolamide-timolol (COSOPT) 2-0.5 % ophthalmic solution Place 1 drop into the left eye 2 (two) times daily. 12/13/21  Yes [provider]  escitalopram (LEXAPRO) 20 MG tablet Take 20 mg by mouth daily. 03/21/21  Yes [provider]  famotidine (PEPCID) 20 MG tablet TAKE 1 TABLET BY MOUTH TWICE A DAY 03/10/22  Yes Ivonne Andrew, NP  fluticasone-salmeterol (ADVAIR HFA) 115-21 MCG/ACT inhaler Inhale 2 puffs into the lungs 2 (two) times daily. 04/28/21  Yes Martina Sinner, MD  gabapentin (NEURONTIN) 100 MG capsule Take 100 mg by mouth 3 (three) times daily as needed (nerve pain).   Yes [provider]  lidocaine (LIDODERM) 5 % Place 1 patch onto the skin daily as needed (for pain- remove old patch first).  10/28/18  Yes [provider]  metoCLOPramide (REGLAN) 10 MG tablet Take 1 tablet (10 mg total) by mouth 3 (three) times daily before meals. 12/02/22  Yes Meryl Dare, MD  midodrine (PROAMATINE) 10 MG tablet Take 1.5 tablets (15 mg total) by mouth 3 (three) times daily. NEEDS FOLLOW UP APPOINTMENT FOR ANYMORE REFILLS 05/11/22  Yes Milford, Anderson Malta, FNP  omeprazole (PRILOSEC) 40 MG capsule TAKE 1 CAPSULE (40 MG TOTAL) BY MOUTH DAILY. 04/17/22  Yes Nandigam, Eleonore Chiquito, MD  ondansetron (ZOFRAN-ODT) 8 MG disintegrating tablet Take 8 mg by mouth as needed for nausea or vomiting.   Yes [provider]  oxyCODONE-acetaminophen (PERCOCET) 10-325 MG tablet Take 1 tablet by mouth 4 (four) times daily as needed for pain. 09/08/19  Yes [provider]  VELPHORO 500 MG chewable tablet Chew 500 mg by mouth 3 (three) times daily.   Yes [provider]  zolpidem (AMBIEN) 10 MG tablet Take 1 tablet (10 mg total) by mouth at bedtime as needed for sleep. 08/24/19  Yes Sherryll Burger, Pratik D, DO  alendronate (FOSAMAX) 70 MG tablet Take 70 mg by mouth once a week. Patient not taking: Reported on 12/18/2022 12/16/22   [provider]      Allergies     Levofloxacin and Tape    Review of Systems   Review of Systems A 10 point review of systems was performed and is negative unless otherwise reported in HPI.  Physical Exam Updated Vital Signs BP (!) 82/57   Pulse (!) 58   Temp 98.6 F (37 C) (Oral)   Resp (!) 23   Ht 4\' 11"  (1.499 m)   Wt 49.9 kg   SpO2 96%   BMI 22.22 kg/m  Physical Exam General: Uncomfortable appearing female, lying in bed.  HEENT:  Sclera anicteric, dry mucous membranes, trachea midline.  Cardiology: RRR, no murmurs/rubs/gallops. Resp: Normal respiratory  rate and effort. Coarse breath sounds bilaterally. No wheezes.  Abd: Soft, non-tender, non-distended. No rebound tenderness or guarding.  GU: Deferred. MSK: No peripheral edema or signs of trauma. Extremities without deformity or TTP. No cyanosis or clubbing. Skin: warm, dry.  Neuro: A&Ox4, CNs II-XII grossly intact. MAEs. Sensation grossly intact.  Psych: Normal mood and affect.   ED Results / Procedures / Treatments   Labs (all labs ordered are listed, but only abnormal results are displayed) Labs Reviewed  BASIC METABOLIC PANEL - Abnormal; Notable for the following components:      Result Value   Chloride 95 (*)    BUN 25 (*)    Creatinine, Ser 9.30 (*)    GFR, Estimated 5 (*)    Anion gap 17 (*)    All other components within normal limits  CBC - Abnormal; Notable for the following components:   RBC 3.05 (*)    Hemoglobin 8.7 (*)    HCT 28.6 (*)    RDW 20.7 (*)    All other components within normal limits  HEPATIC FUNCTION PANEL - Abnormal; Notable for the following components:   Total Protein 6.3 (*)    Albumin 2.9 (*)    Alkaline Phosphatase 156 (*)    All other components within normal limits  TROPONIN I (HIGH SENSITIVITY) - Abnormal; Notable for the following components:   Troponin I (High Sensitivity) 52 (*)    All other components within normal limits  TROPONIN I (HIGH SENSITIVITY) - Abnormal; Notable for the following components:    Troponin I (High Sensitivity) 46 (*)    All other components within normal limits  GROUP A STREP BY PCR  RESP PANEL BY RT-PCR (RSV, FLU A&B, COVID)  RVPGX2  CULTURE, BLOOD (ROUTINE X 2)  CULTURE, BLOOD (ROUTINE X 2)  MRSA NEXT GEN BY PCR, NASAL  RESPIRATORY PANEL BY PCR  EXPECTORATED SPUTUM ASSESSMENT W GRAM STAIN, RFLX TO RESP C  CK  CK  URIC ACID  PROCALCITONIN    EKG EKG Interpretation Date/Time:  Friday December 18 2022 09:08:52 EST Ventricular Rate:  70 PR Interval:  190 QRS Duration:  74 QT Interval:  470 QTC Calculation: 507 R Axis:   84  Text Interpretation: Normal sinus rhythm Diffuse ST & T wave abnormalities Prolonged QT  Similar to prior EKGs Confirmed by Vivi Barrack [phone]) on 12/18/2022 10:09:09 AM  Radiology No results found.  Procedures .Critical Care  Performed by: Loetta Rough, MD Authorized by: Loetta Rough, MD   Critical care provider statement:    Critical care time (minutes):  60   Critical care was necessary to treat or prevent imminent or life-threatening deterioration of the following conditions:  Shock and respiratory failure   Critical care was time spent personally by me on the following activities:  Development of treatment plan with patient or surrogate, discussions with consultants, evaluation of patient's response to treatment, examination of patient, ordering and review of laboratory studies, ordering and review of radiographic studies, ordering and performing treatments and interventions, pulse oximetry, re-evaluation of patient's condition, review of old charts and obtaining history from patient or surrogate   Care discussed with: admitting provider       Medications Ordered in ED Medications  midodrine (PROAMATINE) tablet 15 mg (15 mg Oral Given 12/18/22 1214)  piperacillin-tazobactam (ZOSYN) IVPB 2.25 g (has no administration in time range)  vancomycin variable dose per unstable renal function (pharmacist dosing) (has no  administration in time range)  sodium chloride  0.9 % bolus 250 mL (0 mLs Intravenous Stopped 12/18/22 1134)  cefTRIAXone (ROCEPHIN) 1 g in sodium chloride 0.9 % 100 mL IVPB (0 g Intravenous Stopped 12/18/22 1152)  doxycycline (VIBRAMYCIN) 100 mg in dextrose 5 % 250 mL IVPB (0 mg Intravenous Stopped 12/18/22 1357)  acetaminophen (TYLENOL) tablet 1,000 mg (1,000 mg Oral Given 12/18/22 1125)  sodium chloride 0.9 % bolus 250 mL (0 mLs Intravenous Stopped 12/18/22 1147)  sodium chloride 0.9 % bolus 250 mL (0 mLs Intravenous Stopped 12/18/22 1209)  ipratropium-albuterol (DUONEB) 0.5-2.5 (3) MG/3ML nebulizer solution 3 mL (3 mLs Nebulization Given 12/18/22 1321)  sodium chloride 0.9 % bolus 1,000 mL (1,000 mLs Intravenous New Bag/Given 12/18/22 1403)  vancomycin (VANCOCIN) IVPB 1000 mg/200 mL premix (1,000 mg Intravenous New Bag/Given 12/18/22 1415)    ED Course/ Medical Decision Making/ A&P                          Medical Decision Making Amount and/or Complexity of Data Reviewed Labs: ordered. Decision-making details documented in ED Course. Radiology: ordered.  Risk OTC drugs. Prescription drug management. Decision regarding hospitalization.    This patient presents to the ED for concern of hypotension, SOB/hypoxia, this involves an extensive number of treatment options, and is a complaint that carries with it a high risk of complications and morbidity.  I considered the following differential and admission for this acute, potentially life threatening condition.   MDM:    Consider hypovolemic shock most likely cause of low BP. Recheck once patient placed in room without intervention is 93/52, at her reported baseline. Pt hasn't been eating/drinking, likely reason is hypovolemia, given that she is dialysis patient will need cautious fluid resuscitation, will begin with 250cc and reeval. Consider electrolyte derangements, rhabdo, dehydration. Consider causes of her cough including PNA,  URI/bronchitis. She recently had bronchoscopy so consider possible HCAP or infectious process as a complication from that. She had only nodule noted, not diffuse malignant process. Must also consider PE though no signs of DVT on exam.  No wheezing to indicate COPD exacerbation. She was mildly hypoxic on RA to 88%, now >95% On 2L [place].   Clinical Course as of 12/18/22 1549  Fri Dec 18, 2022  1007 WBC: 8.2 No leukocytosis  [HN]  1007 Hemoglobin(!): 8.7 Similar to baseline from one month ago [HN]  1023 CXR on my interpretation with LUL consolidation. Will begin antibiotics and obtain blood cultures as well.  [HN]  1114 CK Total: 45 wnl [HN]  1115 Troponin I (High Sensitivity)(!): 52 Mildly elevated in s/o ESRD, will get delta [HN]  1118 SBP 78 mmHg, fluids running. [HN]  1143 Giving another 250 cc bolus [HN]  1207 Will give patient her home midodrine [HN]  1301 Group A Strep by PCR: NOT DETECTED [HN]  1314 Patient with LUL collapse noted by radiology. Still query pneumonia. Giving duoneb and calling intensivist and nephrology. [HN]  1327 D/w critical care who recommends treating as HCAP and giving another 1L NS. Will add more fluid and vancomycin per pharmacy consult. Critical care coming to see pt. [HN]  1412 D/w ICU who states that patient is stable for floor, that BP is near her baseline, will still give the 2nd L NS over 2 hours. Nephrology Dr. Arlean Hopping is made aware of patient. [HN]    Clinical Course User Index [HN] Loetta Rough, MD    Labs: I Ordered, and personally interpreted labs.  The pertinent results include:  those lsited above  Imaging Studies ordered: I ordered imaging studies including CXR, CT PE I independently visualized and interpreted imaging. I agree with the radiologist interpretation  Additional history obtained from chart review, partner at bedside.    Cardiac Monitoring: The patient was maintained on a cardiac monitor.  I personally viewed and interpreted  the cardiac monitored which showed an underlying rhythm of: NSR  Reevaluation: After the interventions noted above, I reevaluated the patient and found that they have :improved  Social Determinants of Health: Lives independently  Disposition:  Admit to medicine w/ nephrology/pulmonology following  Co morbidities that complicate the patient evaluation  Past Medical History:  Diagnosis Date   Anxiety    Bacteremia due to Gram-negative bacteria 05/23/2011   Blind    right eye   Blind right eye    CHF (congestive heart failure) (HCC)    Chronic lower back pain    Complication of anesthesia    "woke up during OR; I have an extremely high tolerance" (12/11/2011) 1 procedure was graft; the other procedures were procedures that are typically done with sedation.   DDD (degenerative disc disease), cervical    Depression    Dysrhythmia    "tachycardia" (12/11/2011) new onset afib 10/15/14 EKG   E coli bacteremia 06/18/2011   Elevated LDL cholesterol level 12/2018   ESRD (end stage renal disease) (HCC) 06/12/2011   Tues-Thurs-Sat dialysis   Fibromyalgia    Gastroparesis    GERD (gastroesophageal reflux disease)    Glaucoma    right eye   Gout    H/O carpal tunnel syndrome    Headache 10/2019   no current problem per patient on 11/12/22   Headache(784.0)    "not often anymore" (12/11/2011)   Herpes genitalia 1994   History of blood transfusion    "more than a few times" (12/11/2011)   History of stomach ulcers    Hypotension    Iron deficiency anemia    New onset a-fib (HCC)    10/15/14 EKG   Osteopenia    Peripheral neuropathy 11/2018   Pneumonia    x several but years ago   Pressure ulcer of sacral region, stage 1 07/2019   Seizures (HCC) 1994   "post transplant; only have had that one" (12/11/2011)   Spinal stenosis in cervical region    Stroke [facility])     left basal ganglia lacunar infarct; Right frontal lobe lacunar infarct.   Stroke [facility]) ~ 1999; 2001   "briefly lost my  vision; lost my right eye" (12/11/2011)   Vitamin D deficiency 12/2018     Medicines Meds ordered this encounter  Medications   DISCONTD: lactated ringers bolus 1,000 mL   sodium chloride 0.9 % bolus 250 mL   cefTRIAXone (ROCEPHIN) 1 g in sodium chloride 0.9 % 100 mL IVPB    Order Specific Question:   Antibiotic Indication:    Answer:   CAP   doxycycline (VIBRAMYCIN) 100 mg in dextrose 5 % 250 mL IVPB    Order Specific Question:   Antibiotic Indication:    Answer:   CAP   DISCONTD: HYDROmorphone (DILAUDID) injection 0.25 mg   acetaminophen (TYLENOL) tablet 1,000 mg   sodium chloride 0.9 % bolus 250 mL   sodium chloride 0.9 % bolus 250 mL   midodrine (PROAMATINE) tablet 15 mg   ipratropium-albuterol (DUONEB) 0.5-2.5 (3) MG/3ML nebulizer solution 3 mL   sodium chloride 0.9 % bolus 1,000 mL   vancomycin (VANCOCIN) IVPB 1000 mg/200 mL premix  Order Specific Question:   Indication:    Answer:   Sepsis   piperacillin-tazobactam (ZOSYN) IVPB 2.25 g    Order Specific Question:   Antibiotic Indication:    Answer:   HCAP   vancomycin variable dose per unstable renal function (pharmacist dosing)    I have reviewed the patients home medicines and have made adjustments as needed  Problem List / ED Course: Problem List Items Addressed This Visit       Cardiovascular and Mediastinum   Hypotension - Primary   Relevant Medications   midodrine (PROAMATINE) tablet 15 mg (Start on 12/18/2022  5:00 PM)     Respiratory   Acute respiratory failure with hypoxia (HCC)   Other Visit Diagnoses     Pneumonia of left upper lobe due to infectious organism       Relevant Medications   cefTRIAXone (ROCEPHIN) 1 g in sodium chloride 0.9 % 100 mL IVPB (Completed)   doxycycline (VIBRAMYCIN) 100 mg in dextrose 5 % 250 mL IVPB (Completed)   ipratropium-albuterol (DUONEB) 0.5-2.5 (3) MG/3ML nebulizer solution 3 mL (Completed)   vancomycin (VANCOCIN) IVPB 1000 mg/200 mL premix (Completed)    piperacillin-tazobactam (ZOSYN) IVPB 2.25 g (Start on 12/18/2022  6:00 PM)   vancomycin variable dose per unstable renal function (pharmacist dosing)                   This note was created using dictation software, which may contain spelling or grammatical errors.    Loetta Rough, MD 12/18/22 [phone]

## 2022-12-18 NOTE — Progress Notes (Signed)
ED Pharmacy Antibiotic Sign Off An antibiotic consult was received from an ED provider for vancomycin per pharmacy dosing for sepsis. A chart review was completed to assess appropriateness.  A single dose of ceftriaxone and doxycycline  placed by the ED provider.   The following one time order(s) were placed per pharmacy consult:  vancomycin 1000 mg x 1 dose  Further antibiotic and/or antibiotic pharmacy consults should be ordered by the admitting provider if indicated.   Thank you for allowing pharmacy to be a part of this patient's care.   Delmar Landau, PharmD, BCPS 12/18/2022 1:37 PM ED Clinical Pharmacist -  [phone]

## 2022-12-18 NOTE — ED Notes (Signed)
Dr Bosie Clos at pt bedside and aware of pt's hypotension

## 2022-12-18 NOTE — H&P (Signed)
Date: 12/18/2022               Patient Name:  Tina Watkins MRN: [id number]  DOB: 1966/01/23 Age / Sex: 57 y.o., female   PCP: Ivonne Andrew, NP         Medical Service: Internal Medicine Teaching Service         Attending Physician: Dr. Dickie La, MD      First Contact: Dr. Katheran James, DO Pager [phone]    Second Contact: Dr. Modena Slater, DO Pager [phone]         After Hours (After 5p/  First Contact Pager: [phone]  weekends / holidays): Second Contact Pager: [phone]   SUBJECTIVE   Chief Complaint: Shortness of breath  History of Present Illness:  Tina Watkins is a 57 year old female with PMH of ESRD on HD, anemia of chronic kidney disease, A flutter, CHF, recent diagnosis of malignant lung nodule who presented to [facility], ED with complaint of shortness of breath.   She reports cold-like symptoms since Monday 11/11 - cough with yellow sputum, chills, shortness of breath, myalgias, and nonbloody diarrhea.  These symptoms peaked on Wednesday and have improved marginally since then.  There have been no fevers.  He does ascribe to new low back pain that also started Monday.  She has felt too ill to eat or drink much since Monday.  She denies, headaches, dizziness, lightheadedness, chest pain (although complained of 8/10 pain with cough to ED provider), upper back pain, pain with breathing, abdominal pain, N/V, changes in urine.  She notes left great toe pain and believes she is having a gout flare.  She missed dialysis yesterday because she felt too ill. She underwent bronchoscopy 3 weeks ago revealing a malignant lung nodule and has an appointment with oncology on Wednesday 11/20.  ED Course: - Vitals: Systolic Bps 83/51 then 71/47 , resuscitated with 2L IV fluids, improved to 80s. Afebrile, RR 23, O2 96% on 2L [place] after being 88% on ra.  - On exam: course breath sounds, no wheezes, and dry mucus membranes.  - Labs: WBCs 8.2, Hg 8.7 (baseline), CK 45, Troponin 52  then 46, GAStrep PCR not detected - Imaging: CXR suggestive of LUL pneumonia vs collapse. CT angio chest PE pending. - With imaging suggestive of PNA vs lung collapse, she started on antibiotics (rocephin, doxycycline, vancomycin, zosyn) and duoneb. Home midodrine provided given hypotension. She was evaluated by CCM but not determined to need critical care, but did rec treating as HCAP. Admitted to medicine for hypoxic respiratory failure.  Past Medical History Past Medical History:  Diagnosis Date   Anxiety    Bacteremia due to Gram-negative bacteria 05/23/2011   Blind    right eye   Blind right eye    CHF (congestive heart failure) (HCC)    Chronic lower back pain    Complication of anesthesia    "woke up during OR; I have an extremely high tolerance" (12/11/2011) 1 procedure was graft; the other procedures were procedures that are typically done with sedation.   DDD (degenerative disc disease), cervical    Depression    Dysrhythmia    "tachycardia" (12/11/2011) new onset afib 10/15/14 EKG   E coli bacteremia 06/18/2011   Elevated LDL cholesterol level 12/2018   ESRD (end stage renal disease) (HCC) 06/12/2011   Tues-Thurs-Sat dialysis   Fibromyalgia    Gastroparesis    GERD (gastroesophageal reflux disease)    Glaucoma    right eye  Gout    H/O carpal tunnel syndrome    Headache 10/2019   no current problem per patient on 11/12/22   Headache(784.0)    "not often anymore" (12/11/2011)   Herpes genitalia 1994   History of blood transfusion    "more than a few times" (12/11/2011)   History of stomach ulcers    Hypotension    Iron deficiency anemia    New onset a-fib (HCC)    10/15/14 EKG   Osteopenia    Peripheral neuropathy 11/2018   Pneumonia    x several but years ago   Pressure ulcer of sacral region, stage 1 07/2019   Seizures (HCC) 1994   "post transplant; only have had that one" (12/11/2011)   Spinal stenosis in cervical region    Stroke [facility])     left basal  ganglia lacunar infarct; Right frontal lobe lacunar infarct.   Stroke [facility]) ~ 1999; 2001   "briefly lost my vision; lost my right eye" (12/11/2011)   Vitamin D deficiency 12/2018     Meds:  Allopurinol 100 mg daily  Amiodarone 100 mg daily  Eliquis 2.5 mg BID Colchicine 0.6 mg daily prn  Flexeril 5 mg prn  Voltaren gel Benadryl COSOPT eyes drops daily   Lexapro 20 mg daily  Pepcid 20 mg BID Advair 2 puffs BID Gabapentin 100 mg TID Lidocaine patch pprn  Reglan 10 mg in morning  Midodrine 15 mg TID Omeprazole 40 mg daily  Zofran 8 mg prn  Percocet 10-325 mg q4h Velphoro 500 mg TID Ambien 10 prn  Fosamax 70 once weekly   Past Surgical History Past Surgical History:  Procedure Laterality Date   ANTERIOR CERVICAL DECOMP/DISCECTOMY FUSION N/A 01/08/2015   Procedure: Anterior Cervical Three-Four/Four-Five Decompression/Diskectomy/Fusion;  Surgeon: Hilda Lias, MD;  Location: [facility] NEURO ORS;  Service: Neurosurgery;  Laterality: N/A;  C3-4 C4-5 Anterior cervical decompression/diskectomy/fusion   APPENDECTOMY  ~ 2004   BIOPSY  09/12/2020   Procedure: BIOPSY;  Surgeon: Rachael Fee, MD;  Location: [facility] ENDOSCOPY;  Service: Endoscopy;;   BRONCHIAL BIOPSY  11/16/2022   Procedure: BRONCHIAL BIOPSIES;  Surgeon: Leslye Peer, MD;  Location: [facility] ENDOSCOPY;  Service: Pulmonary;;   BRONCHIAL BRUSHINGS  11/16/2022   Procedure: BRONCHIAL BRUSHINGS;  Surgeon: Leslye Peer, MD;  Location: [facility] ENDOSCOPY;  Service: Pulmonary;;   BRONCHIAL NEEDLE ASPIRATION BIOPSY  11/16/2022   Procedure: BRONCHIAL NEEDLE ASPIRATION BIOPSIES;  Surgeon: Leslye Peer, MD;  Location: [facility] ENDOSCOPY;  Service: Pulmonary;;   CATARACT EXTRACTION     right eye   COLONOSCOPY     COLONOSCOPY WITH PROPOFOL N/A 09/12/2020   Procedure: COLONOSCOPY WITH PROPOFOL;  Surgeon: Rachael Fee, MD;  Location: [facility] ENDOSCOPY;  Service: Endoscopy;  Laterality: N/A;   ENUCLEATION  2001   "right"   ESOPHAGOGASTRODUODENOSCOPY  (EGD) WITH PROPOFOL N/A 04/21/2012   Procedure: ESOPHAGOGASTRODUODENOSCOPY (EGD) WITH PROPOFOL;  Surgeon: Rachael Fee, MD;  Location: [facility] ENDOSCOPY;  Service: Endoscopy;  Laterality: N/A;   ESOPHAGOGASTRODUODENOSCOPY (EGD) WITH PROPOFOL N/A 09/12/2020   Procedure: ESOPHAGOGASTRODUODENOSCOPY (EGD) WITH PROPOFOL;  Surgeon: Rachael Fee, MD;  Location: [facility] ENDOSCOPY;  Service: Endoscopy;  Laterality: N/A;   FIDUCIAL MARKER PLACEMENT  11/16/2022   Procedure: FIDUCIAL MARKER PLACEMENT;  Surgeon: Leslye Peer, MD;  Location: [facility] ENDOSCOPY;  Service: Pulmonary;;   HEMOSTASIS CONTROL  11/16/2022   Procedure: HEMOSTASIS CONTROL;  Surgeon: Leslye Peer, MD;  Location: [facility] ENDOSCOPY;  Service: Pulmonary;;   INSERTION OF DIALYSIS CATHETER  1988   "  AV graft LUA & LFA; LUA worked for 1 day; LFA never worked"   IR FLUORO GUIDE CV LINE RIGHT  07/11/2019   IR FLUORO GUIDE CV LINE RIGHT  10/20/2019   IR FLUORO GUIDE CV LINE RIGHT  05/31/2020   IR PTA VENOUS EXCEPT DIALYSIS CIRCUIT  05/31/2020   IR RADIOLOGY PERIPHERAL GUIDED IV START  07/11/2019   IR US GUIDE VASC ACCESS RIGHT  07/11/2019   IR US GUIDE VASC ACCESS RIGHT  07/11/2019   IR US GUIDE VASC ACCESS RIGHT  07/11/2019   IR VENOCAVAGRAM IVC  05/31/2020   KIDNEY TRANSPLANT  1994; 1999; 2005   "right"   MULTIPLE TOOTH EXTRACTIONS     POLYPECTOMY  09/12/2020   Procedure: POLYPECTOMY;  Surgeon: Rachael Fee, MD;  Location: [facility] ENDOSCOPY;  Service: Endoscopy;;   RIGHT HEART CATH N/A 03/23/2019   Procedure: RIGHT HEART CATH;  Surgeon: Dolores Patty, MD;  Location: [facility] INVASIVE CV LAB;  Service: Cardiovascular;  Laterality: N/A;   TONSILLECTOMY     TOTAL NEPHRECTOMY  1988?; 1994; 2005   VIDEO BRONCHOSCOPY WITH RADIAL ENDOBRONCHIAL ULTRASOUND  11/16/2022   Procedure: VIDEO BRONCHOSCOPY WITH RADIAL ENDOBRONCHIAL ULTRASOUND;  Surgeon: Leslye Peer, MD;  Location: [facility] ENDOSCOPY;  Service: Pulmonary;;    Social:  Lives with her fiance [name] Occupation:  Does not work Support: Chief Executive Officer locally Level of Function: Independent in ADLs/IADLs, walks with a limp PCP: Ivonne Andrew, NP Substances: Smoker 2015 - 2024 (stopped yesterday), rare alcohol, marijuana monthly  Family History:  Cancer, diabetes, HTN  Allergies: Allergies as of 12/18/2022 - Review Complete 12/18/2022  Allergen Reaction Noted   Levofloxacin Itching and Rash 05/23/2011   Tape Other (See Comments) 03/20/2019    Review of Systems: A complete ROS was negative except as per HPI.   OBJECTIVE:   Physical Exam: Blood pressure (!) 82/57, pulse (!) 58, temperature 98.6 F (37 C), temperature source Oral, resp. rate (!) 23, height 4\' 11"  (1.499 m), weight 49.9 kg, SpO2 96%.  Constitutional: Ill-appearing. In no acute distress. HENT: Normocephalic, atraumatic,  Eyes: Sclera non-icteric, PERRL, EOM intact. R eye resection. Neck:normal atraumatic, no neck masses, normal thyroid, no jvd Cardio:Regular rate and rhythm. No murmurs, rubs, or gallops. 2+ bilateral radial and dorsalis pedis  pulses. Pulm: Clear to auscultation bilaterally. Diminished on L, poor air flow. Normal work of breathing on room air. Abdomen: Soft, non-tender, non-distended, positive bowel sounds. ZOX:[id number] for extremity edema. Skin:Warm and dry. Neuro:Alert and oriented x3. No focal deficit noted. Psych:Pleasant mood and affect.  Labs: CBC    Component Value Date/Time   WBC 8.2 12/18/2022 0923   RBC 3.05 (L) 12/18/2022 0923   HGB 8.7 (L) 12/18/2022 0923   HGB 10.1 (L) 10/13/2019 0916   HGB 6.7 (LL) 05/08/2019 0854   HCT 28.6 (L) 12/18/2022 0923   HCT 25.7 (L) 07/08/2019 1446   PLT 225 12/18/2022 0923   PLT 170 10/13/2019 0916   PLT 170 05/08/2019 0854   MCV 93.8 12/18/2022 0923   MCV 98 (H) 05/08/2019 0854   MCH 28.5 12/18/2022 0923   MCHC 30.4 12/18/2022 0923   RDW 20.7 (H) 12/18/2022 0923   RDW 21.9 (H) 05/08/2019 0854   LYMPHSABS 0.9 03/30/2021 0915   MONOABS 0.3  03/30/2021 0915   EOSABS 0.1 03/30/2021 0915   BASOSABS 0.0 03/30/2021 0915     CMP     Component Value Date/Time   NA 135 12/18/2022 0923   NA 138 05/08/2019  0854   K 3.8 12/18/2022 0923   CL 95 (L) 12/18/2022 0923   CO2 23 12/18/2022 0923   GLUCOSE 89 12/18/2022 0923   BUN 25 (H) 12/18/2022 0923   BUN 82 (HH) 05/08/2019 0854   CREATININE 9.30 (H) 12/18/2022 0923   CREATININE 4.30 (HH) 10/13/2019 0916   CALCIUM 9.2 12/18/2022 0923   CALCIUM 9.0 04/10/2019 0828   PROT 6.3 (L) 12/18/2022 0923   ALBUMIN 2.9 (L) 12/18/2022 0923   AST 15 12/18/2022 0923   AST 44 (H) 10/13/2019 0916   ALT 14 12/18/2022 0923   ALT 34 10/13/2019 0916   ALKPHOS 156 (H) 12/18/2022 0923   BILITOT 1.0 12/18/2022 0923   BILITOT 0.7 10/13/2019 0916   GFRNONAA 5 (L) 12/18/2022 0923   GFRNONAA 11 (L) 10/13/2019 0916   GFRAA 13 (L) 10/13/2019 0916    Imaging: DG Chest 2 View  Result Date: 12/18/2022 CLINICAL DATA:  Shortness of breath. EXAM: CHEST - 2 VIEW COMPARISON:  11/16/2022. FINDINGS: Since the prior study, there is new homogeneous opacification of left upper hemithorax. There also heterogeneous opacities in the left lower hemithorax. There are increased interstitial markings in the right lung which may represent mild underlying pulmonary edema. Right lung is otherwise clear. No dense consolidation or lung collapse. Bilateral costophrenic angles are clear. Stable cardio-mediastinal silhouette. Redemonstration of vascular stent in the superior vena cava. No acute osseous abnormalities. Partially seen lower cervical spinal fixation hardware. The soft tissues are within normal limits. IMPRESSION: *Findings favor near complete left upper lobe lung collapse. Contrast-enhanced CT scan chest and/or bronchoscopy is recommended. *Increased interstitial markings in the right lung may represent mild underlying pulmonary edema. Correlate clinically. *No pleural effusion. Electronically Signed   By: Jules Schick  M.D.   On: 12/18/2022 12:02     EKG: personally reviewed my interpretation is sinus rhythm with prolonged QT. Consistent with prior EKG.  ASSESSMENT & PLAN:   Assessment & Plan by Problem: Principal Problem:   Shortness of breath  Tina Watkins is a 57 y.o. female with pertinent PMH of recent LUL malignant nodule, ESRD on HD, anemia of chronic kidney disease who presented with dyspnea and productive cough and is admitted for acute hypoxic respiratory failure.  Acute hypoxic respiratory failure  CAP vs HCAP vs post-obstructive PNA vs LUL lung collapse Malignant lung nodule of LUL Patient with symptoms of respiratory infection x 5 days with productive cough and new oxygen requirement of 2L.  Chest x-ray with significant opacities on the left side, favored to be a collapsed left upper lobe versus pneumonia.  Lung exam is less remarkable, generally clear with somewhat reduced sounds on L, but still present.  Fortunately she is afebrile and there is no white count. At this point it is unclear if she has a bacterial infection.  Will cover for now.  She does have COPD but there are no wheezing on exam and no respiratory distress, does not appear to be an active exacerbation at this time.  Of note, a recent bronchoscopy 3 weeks ago revealed malignant nodule, LUL 1.4x2.6cm.  PLAN: - Critical care is following for possible intervention of LUL lung collapse  - MRSA negative. Flu, COVID, RSV negative. Full panel is pending. Sputum cultures to be collected. Legionella pending - Procalcitonin pending - Blood cultures drawn and pending - Vancomycin, Zosyn, doxycycline - O2 goals 88-92% in pt with COPD  Acute on Chronic Hypotension Reduced fluid intake this week due to illness.  Likely volume down,  further supported by dry mucus membranes.  Reports systolic pressures are typically in the 80-90s, though today she was in the 70s.  There was improvement after fluid resuscitation. Mild troponin elevation in  the ED is likely due to demand ischemia.  Not currently concerned for septic shock as she has returned to her baseline and there is not clear end-organ damage. - Resume home midodrine 15 mg TID  ESRD on HD Nephrology on board for TTS dialysis, thank you. Pt missed dialysis Thursday.  Him is not high, if anything it is low, no electrolyte derangements or uremia. Will replenish fluids sparingly and monitor electrolytes. - Continue home Velphoro 500 mg TID  Gout She believes she is in a flare with L great toe pain.  Uric acid is not elevated but that cannot exclude. Exam is not remarkable.  Resume home Allopurinol 100 mg daily, Colchicine 0.6 mg daily prn.  Qtc Prolongation Above 500. On many prolonging medicines. Will be cautious with this patient's anti-emetics and Abx choices (avoiding macrolide)  A Flutter- Rate/Rhythm is regular at time of admission. Continue Amiodarone 100 mg daily, Eliquis 2.5 mg BID COPD- No active exacerbation. No baseline O2 requirement. Breo Ellipta 2 puffs BID Depression- Lexapro 20 mg daily  Glaucoma- COSOPT eyes drops daily   Chronic Pain- Continue Percocet 10-325 mg q4h, Flexeril 5 mg prn, Voltaren gel, Gabapentin 100 mg TID, Lidocaine patch prn  Gastroparesis/GERD- Continue Reglan 10 mg in morning, Pepcid 20 mg BID. Hold Omeprazole 40 mg daily, Zofran 8 mg prn. Insomnia- Ambien   Diet: Renal with fluid restriction VTE: DOAC IVF: None,None Code: Full  Dispo: Admit patient to Observation with expected length of stay less than 2 midnights.  Signed: Katheran James, DO Internal Medicine Resident PGY-1  12/18/2022, 5:31 PM   Dr. Katheran James, DO Pager [phone]  Please contact the on call pager after 5 pm and on weekends at [phone].

## 2022-12-18 NOTE — ED Notes (Signed)
Placed on 2lpm Tina Watkins due to initial low o2 sats. Pt tolerating same well

## 2022-12-19 ENCOUNTER — Encounter (HOSPITAL_COMMUNITY): Payer: Self-pay | Admitting: Internal Medicine

## 2022-12-19 DIAGNOSIS — R911 Solitary pulmonary nodule: Secondary | ICD-10-CM

## 2022-12-19 DIAGNOSIS — J9601 Acute respiratory failure with hypoxia: Secondary | ICD-10-CM | POA: Diagnosis not present

## 2022-12-19 DIAGNOSIS — J189 Pneumonia, unspecified organism: Secondary | ICD-10-CM | POA: Diagnosis not present

## 2022-12-19 DIAGNOSIS — K7460 Unspecified cirrhosis of liver: Secondary | ICD-10-CM | POA: Insufficient documentation

## 2022-12-19 DIAGNOSIS — I272 Pulmonary hypertension, unspecified: Secondary | ICD-10-CM | POA: Insufficient documentation

## 2022-12-19 LAB — CBC
HCT: 25.6 % — ABNORMAL LOW (ref 36.0–46.0)
Hemoglobin: 7.8 g/dL — ABNORMAL LOW (ref 12.0–15.0)
MCH: 27.8 pg (ref 26.0–34.0)
MCHC: 30.5 g/dL (ref 30.0–36.0)
MCV: 91.1 fL (ref 80.0–100.0)
Platelets: 219 10*3/uL (ref 150–400)
RBC: 2.81 MIL/uL — ABNORMAL LOW (ref 3.87–5.11)
RDW: 20.6 % — ABNORMAL HIGH (ref 11.5–15.5)
WBC: 7.2 10*3/uL (ref 4.0–10.5)
nRBC: 0 % (ref 0.0–0.2)

## 2022-12-19 LAB — BASIC METABOLIC PANEL
Anion gap: 11 (ref 5–15)
BUN: 12 mg/dL (ref 6–20)
CO2: 25 mmol/L (ref 22–32)
Calcium: 9.1 mg/dL (ref 8.9–10.3)
Chloride: 98 mmol/L (ref 98–111)
Creatinine, Ser: 5.32 mg/dL — ABNORMAL HIGH (ref 0.44–1.00)
GFR, Estimated: 9 mL/min — ABNORMAL LOW (ref >60)
Glucose, Bld: 77 mg/dL (ref 70–99)
Potassium: 3.4 mmol/L — ABNORMAL LOW (ref 3.5–5.1)
Sodium: 134 mmol/L — ABNORMAL LOW (ref 135–145)

## 2022-12-19 MED ORDER — VANCOMYCIN HCL 500 MG/100ML IV SOLN
500.0000 mg | Freq: Once | INTRAVENOUS | Status: DC
Start: 2022-12-19 — End: 2022-12-19
  Filled 2022-12-19: qty 100

## 2022-12-19 MED ORDER — FAMOTIDINE 20 MG PO TABS
10.0000 mg | ORAL_TABLET | Freq: Every day | ORAL | Status: DC
  Administered 2022-12-20 – 2022-12-23 (×4): 10 mg via ORAL
  Filled 2022-12-19 (×5): qty 1

## 2022-12-19 MED ORDER — POTASSIUM CHLORIDE 20 MEQ PO PACK
20.0000 meq | PACK | Freq: Once | ORAL | Status: AC
  Administered 2022-12-19: 20 meq via ORAL
  Filled 2022-12-19: qty 1

## 2022-12-19 NOTE — Progress Notes (Addendum)
NAME:  Tina Watkins, MRN:  [id number], DOB:  1965/12/16, LOS: 1 ADMISSION DATE:  12/18/2022, CONSULTATION DATE:  12/18/22 REFERRING MD:  Jearld Fenton CHIEF COMPLAINT:  Aches, generalized weakness   History of Present Illness:  Tina Watkins is a 57 y.o. female who has a PMH as below including but not limited to ESRD on HD TTS (last Tuesday 11/12 and missed Thurs 11/14 due to illness), chronic hypotension with SBP in the 90s and on daily Midodrine, recent diagnosis lung CA (LUL nodule s/p navigational bronchoscopy biopsy on 11/16/22 with Dr. Delton Coombes with positive cytology, referred to medical oncology but has yet to see them), COPD, tobacco dependence, dCHF, A.fib on Eliquis, Cirrhosis, Pancreatic tal cystic lesion.  She presented to [facility] ED 11/15 with cough productive of yellow sputum, aches, generalized weakness and feeling bad x 1 week. Symptoms started mildly a week or so prior and persisted. She saw pulmonary as an outpatient on 11/11 and was prescribed Augmentin. She had COVID test performed at dialysis on 11/12 and also by her pain provider this week which were both negative. She denies any recent travel or exposure to known sick contacts. Denies any fevers/chills/sweats, N/V.  In ED, she was noted to have SBP in the 80s initially then later in the 70s. She re-iterated that her baseline SBP is in the 90s chronically. She mainly felt generalized weakness, sore throat, ongoing cough. COVID and flu A/B were negative as was GAS throat swab. CXR showed left sided PNA. CT chest pending.  Pertinent  Medical History:  has Gastroparesis; WEIGHT LOSS; NAUSEA AND VOMITING; History of renal transplantation; Septic shock (HCC); Acute on chronic kidney failure (HCC); Bacteremia due to Gram-negative bacteria; ESRD (end stage renal disease) (HCC); Lower urinary tract infectious disease; E coli bacteremia; Gout; Herpes infection; Anxiety; Chronic pain syndrome; Dehydration, mild; Pyelonephritis, acute; Sepsis  (HCC); History of kidney transplant; Muscle spasms of neck; Bleeding hemorrhoid; Anemia in chronic kidney disease; Urinary tract infectious disease; Cervical stenosis of spinal canal; Chest pain; Multifocal pneumonia; AKI (acute kidney injury) (HCC); Thrush, oral; 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; Tobacco abuse; Right leg swelling; Right leg pain; Stroke (cerebrum) (HCC); GERD (gastroesophageal reflux disease); Acute venous embolism and thrombosis of deep vessels of proximal lower extremity, right (HCC); Paresthesia; Neck pain; DDD (degenerative disc disease), cervical; Neuropathy; Swollen abdomen; Pneumonia due to COVID-19 virus; Hypotension; Symptomatic anemia; CHF (congestive heart failure) (HCC); Weakness; Pressure injury of skin; Chest congestion; Itching; Pneumonia of left upper lobe due to infectious organism; Decubitus ulcer of sacral region, stage 1; Other pancytopenia (HCC); Chronic pain of both knees; Atrial flutter (HCC); Hypercoagulable state due to atrial fibrillation (HCC); Aortic atherosclerosis (HCC); Acute respiratory failure with hypoxia (HCC); Abnormal CT scan; Acute recurrent pansinusitis; Cervical radiculitis; Chills (without fever); Coagulation defect, unspecified (HCC); ETD (Eustachian tube dysfunction), bilateral; Bilateral shoulder pain; Bilateral hand swelling; Nausea & vomiting; Prolonged Q-T interval on ECG; Bilateral hip pain; Encounter for monitoring amiodarone therapy; Atrial fibrillation (HCC); COVID-19; Pulmonary nodule 1 cm or greater in diameter; and Shortness of breath on their problem list.  Significant Hospital Events: Including procedures, antibiotic start and stop dates in addition to other pertinent events   11/15 admit  Interim History / Subjective:   Feels better.  No cough or fevers.  Objective:  Blood pressure (!) 82/52, pulse (!) 53, temperature 97.8 F (36.6 C), temperature source Oral, resp. rate 20, height 4'  11" (1.499 m), weight 49.4 kg, SpO2 100%.  Intake/Output Summary (Last 24 hours) at 12/19/2022 1356 Last data filed at 12/19/2022 0500 Gross per 24 hour  Intake 855.39 ml  Output 0 ml  Net 855.39 ml   Filed Weights   12/18/22 1059 12/18/22 1951 12/19/22 0000  Weight: 49.9 kg 49.2 kg 49.4 kg    Examination: Gen:      No acute distress HEENT:  EOMI, sclera anicteric Neck:     No masses; no thyromegaly Lungs:    Clear to auscultation bilaterally; normal respiratory effort CV:         Regular rate and rhythm; no murmurs Abd:      + bowel sounds; soft, non-tender; no palpable masses, no distension Ext:    No edema; adequate peripheral perfusion Skin:      Warm and dry; no rash Neuro: alert and oriented x 3 Psych: normal mood and affect   Labs/imaging personally reviewed:  CT chest 11/15 > dense left lung consolidation, moderate left effusion  Assessment/Plan:   Presumed left HCAP -CT this more suggestive of left sided PNA versus collapse/plugging.  - Doxy/Zosyn. - Follow cultures. - Add sputum culture, RVP, PCT. - Will ask IR to do thoracentesis of moderate left effusion. - Bronchial hygiene.  Hx COPD with ongoing tobacco dependence. Elwin Sleight in lieu of PTA Advair. - PRN Albuterol. - Tobacco cessation counseling. - F/u in our office as an outpatient (next scheduled for 03/31/23).  Chronic hypotension - at risk for worsening with presumed HCAP. - Additional 1L fluids over 2 hours. - Continue PTA Midodrine.  ESRD on HD TTS - last dialyzed Tues 11/12. - Primary team to notify nephrology of admission. No urgent need for HD today.  Recently diagnosed LUL malignancy. - Needs to ensure follow up with medical oncology, has yet to see them.   Best practice (evaluated daily):  Per primary.  Signature:   Chilton Greathouse MD  Pulmonary & Critical care See Amion for pager  If no response to pager , please call [phone] until 7pm After 7:00 pm call Elink   [phone] 12/19/2022, 1:57 PM

## 2022-12-19 NOTE — Progress Notes (Addendum)
Pharmacy Antibiotic Note  Tina Watkins is a 57 y.o. female for which pharmacy has been consulted for vancomycin and zosyn dosing for pneumonia.  Patient with a history of ESRD on HD TTS (last completed 11/12), chronic hypotension on midodrine, lung cancer, COPD, HF, AF on eliquis, cirrhosis, pancreatic lesion. Patient presenting with generalized weakness. Rocephin, doxycline, and 1g of vancomycin given in the ED 11/15.  WBC 7.2, afebrile COVID neg / flu neg  Patient dialyzed overnight. Tolerated 3.5 hour session at BFR of ~300-320 mL/min. Will dose with 500 mg today. MRSA swab negative. Discussed with primary team. To discontinue vancomycin, continue Zosyn + Doxycycline.  Plan: Discontinue vancomycin Continue Zosyn 2.25g q8h Monitor WBC, fever, renal function, cultures Pharmacy will sign off on consult. We will continue to monitor for dose adjustment as clinically indicated.  Height: 4\' 11"  (149.9 cm) Weight: 49.4 kg (108 lb 14.5 oz) IBW/kg (Calculated) : 43.2  Temp (24hrs), Avg:98.2 F (36.8 C), Min:97.4 F (36.3 C), Max:98.7 F (37.1 C)  Recent Labs  Lab 12/18/22 0923 12/19/22 0443  WBC 8.2 7.2  CREATININE 9.30* 5.32*    Estimated Creatinine Clearance: 8.1 mL/min (A) (by C-G formula based on SCr of 5.32 mg/dL (H)).    Allergies  Allergen Reactions   Levofloxacin Itching and Rash   Tape Other (See Comments)    "Certain surgical tapes peel off my skin"   Microbiology results: 12/18/22 Bcx 2/2: No growth <24h 12/18/22 MRSA: not detected 12/18/22 Respiratory Panel: negative  Thank you for allowing pharmacy to be a part of this patient's care.  Lora Paula, PharmD PGY-2 Infectious Diseases Pharmacy Resident [facility] 12/19/2022 12:34 PM

## 2022-12-19 NOTE — Progress Notes (Signed)
HD#1 SUBJECTIVE:  Patient Summary: Tina Watkins is a 57 y.o. female with pertinent PMH of recent LUL malignant nodule, ESRD on HD, anemia of chronic kidney disease who presented with dyspnea and productive cough and is admitted for acute hypoxic respiratory failure.    Overnight Events: None  Interim History: Pt feels fatigued and dyspneic this morning. No fevers or chills. Productive cough remains. L toe pain from gout has improved a good bit.  OBJECTIVE:  Vital Signs: Vitals:   12/19/22 0418 12/19/22 0728 12/19/22 1056 12/19/22 1252  BP: (!) 90/54 112/64 (!) 79/50 (!) 82/52  Pulse: 71 62 (!) 52 (!) 53  Resp: 20 18 18 20   Temp: 98.3 F (36.8 C) (!) 97.4 F (36.3 C) 97.7 F (36.5 C) 97.8 F (36.6 C)  TempSrc: Oral Oral Oral Oral  SpO2: 100% 100% 100%   Weight:      Height:       Supplemental O2: Nasal Cannula SpO2: 100 % O2 Flow Rate (L/min): 2 L/min  Filed Weights   12/18/22 1059 12/18/22 1951 12/19/22 0000  Weight: 49.9 kg 49.2 kg 49.4 kg     Intake/Output Summary (Last 24 hours) at 12/19/2022 1311 Last data filed at 12/19/2022 0500 Gross per 24 hour  Intake 855.39 ml  Output 0 ml  Net 855.39 ml   Net IO Since Admission: 1,616.35 mL [12/19/22 1311]  Physical Exam: Physical Exam Constitutional:      General: She is not in acute distress.    Appearance: She is well-developed. She is not ill-appearing.  Cardiovascular:     Rate and Rhythm: Normal rate and regular rhythm.     Heart sounds: Normal heart sounds.  Pulmonary:     Effort: Pulmonary effort is normal.     Breath sounds: Wheezing present.     Comments: Diffuse expiratory wheezes Musculoskeletal:     Comments: Non tender lipomatous lump across R scapula  Skin:    General: Skin is warm.  Neurological:     General: No focal deficit present.     Mental Status: She is alert and oriented to person, place, and time.    Patient Lines/Drains/Airways Status     Active Line/Drains/Airways      Name Placement date Placement time Site Days   Peripheral IV 12/18/22 20 G 1.88" Anterior;Left;Proximal Forearm 12/18/22  1104  Forearm  1   Fistula / Graft Left Forearm Arteriovenous vein graft 02/02/86  1200  Forearm  [id number]   Hemodialysis Catheter Right Femoral vein 11/14/20  --  Femoral vein  765            ASSESSMENT/PLAN:  Assessment: Principal Problem:   Shortness of breath Active Problems:   Gastroparesis   ESRD (end stage renal disease) (HCC)   Gout   Neuropathy   Hypotension   Pneumonia of left upper lobe due to infectious organism   Acute respiratory failure with hypoxia (HCC)  Tina Watkins is a 57 y.o. female with pertinent PMH of recent LUL malignant nodule, ESRD on HD, anemia of chronic kidney disease who presented with dyspnea and productive cough and is admitted for acute hypoxic respiratory failure.    Acute hypoxic respiratory failure  Likely Post-obstructive PNA with LUL consolidation and pleural effusion Malignant lung nodule of LUL Respiratory status is currently stable. Pt briefly demonstrated excellent oxygenation on room air this am but returned to oxygen due to subjective dyspnea likely due to lung consolidation/effusion. With known mass, consolidation, malaise, will continue to treat  for post-obstructive pneumonia. Remains afebrile and without leukocytosis. Procalcitonin supports continued antibiotics as well.  Lung exam with wheezes, but not concerned for COPD exacerbation, she is receiving duonebs. - Recent bronchoscopy 3 weeks ago revealed malignant nodule, LUL 1.4x2.6cm.  - MRSA negative. Flu, COVID, RSV negative. Full viral panel is negative. - CT chest suggested increase in size in R-sided lung masses (mass on L was biopsied). PLAN: - Pulm is following. Will consider possible diagnostic/therapeutic thoracentesis  - Sputum cultures to be collected. Legionella pending - Blood cultures drawn and pending - Zosyn and doxycycline. Stopped Vanc as  MRSA negative.   Acute on Chronic Hypotension Reduced fluid intake this week due to illness likely lead to hypovolemia.  That said, she reports systolic pressures are typically in the 80-90s, which she has returned to. Not currently concerned for septic shock as she is at her BP baseline, afebrile, and mentation is intact - Continue home midodrine 15 mg TID   ESRD on HD Nephrology on board for TTS dialysis, thank you. Pt missed dialysis Thursday but got it overnight. No electrolyte derangements or uremia.  - Continue home Velphoro 500 mg TID   Gout L great toe pain has improved today. There is warmth on exam. Pan has improved since resuming home medicines. - Resume home Allopurinol 100 mg daily, Colchicine 0.6 mg daily prn.   Qtc Prolongation Above 500. On many prolonging medicines. Will be cautious with this patient's anti-emetics and Abx choices (avoiding macrolide)   A Flutter- Rate/Rhythm is regular at time of admission. Continue Amiodarone 100 mg daily, Eliquis 2.5 mg BID COPD- No active exacerbation. No baseline O2 requirement. Breo Ellipta 2 puffs BID Depression- Lexapro 20 mg daily  Glaucoma- COSOPT eyes drops daily   Chronic Pain- Continue Percocet 10-325 mg q4h, Flexeril 5 mg prn, Voltaren gel, Gabapentin 100 mg TID, Lidocaine patch prn  Gastroparesis/GERD- Continue Reglan 10 mg in morning, Pepcid 10 mg BID. Hold Omeprazole 40 mg daily, Zofran 8 mg prn. Insomnia- Ambien 5mg   Best Practice: Diet: Renal diet with fluid restriction VTE: apixaban (ELIQUIS) tablet 2.5 mg Start: 12/18/22 2200 Code: Full AB: Zosyn, Doxycycline DISPO: Anticipated discharge in 2-5 days to Home pending  medical workup .  Signature: Katheran James, D.O.  Internal Medicine Resident, PGY-1 [facility]  Pager: [phone] 1:11 PM, 12/19/2022   Please contact the on call pager after 5 pm and on weekends at [phone].

## 2022-12-19 NOTE — Progress Notes (Signed)
Subjective: Seen in room, states feels better.  Tolerated dialysis late last night, no UF.  Objective Vital signs in last 24 hours: Vitals:   12/19/22 0418 12/19/22 0728 12/19/22 1056 12/19/22 1252  BP: (!) 90/54 112/64 (!) 79/50 (!) 82/52  Pulse: 71 62 (!) 52 (!) 53  Resp: 20 18 18 20   Temp: 98.3 F (36.8 C) (!) 97.4 F (36.3 C) 97.7 F (36.5 C) 97.8 F (36.6 C)  TempSrc: Oral Oral Oral Oral  SpO2: 100% 100% 100%   Weight:      Height:       Weight change:   Physical Exam: General: Alert, thin chronically ill female NAD Heart: RRR no MRG Lungs: Left-sided crackles, right clear nonlabored breathing Abdomen: NABS soft NT ND no ascites appreciated Extremities: No pedal edema Dialysis Access: R Fem TDC dressing dry clear   OP HD: TTS G-O  4h  400/700  48.9kg   2/2 bath  R fem TDC   Heparin 2000  - last HD 11/12, post wt 49.3kg - rocaltrol 2.75 mcg - venofer 50 mg weekly iv   Problem/Plan: Cute hypoxic respiratory failure secondary to CAP vs HCAP vs post-obstructive PNA vs LUL lung collapse Malignant lung nodule of LUL= plan per admit team noted care following for possible invention  LUL lung collapse/antibiotics vancomycin Zosyn doxycycline, BC drawn pending ESRD -HD TTS, had dialysis late last night into this a.m., (no excess volume , no UF  HD ) K3.4 ,no current HD needs today HTN/vol= acute on chronic hypotension improved  some with fluid resection in ER.  On home midodrine 15 mg 3 times daily-  Left upper lobe lung nodule with recent diagnosis of malignancy= per admit team /critical care/oncology consulted COPD= on bronchodilators /52.5-pack-year smoking history Anemia of ESRD-Hb 8.7 follow-up trend transfuse Hgb below 7, no ESA recent lung cancer diagnosis Secondary hyperparathyroidism -calcium 9.1, , phosphorus 5.6 continue Rocaltrol with dialysis and Velphoro binders with food   Lenny Pastel, PA-C [facility] Beeper [phone] 12/19/2022,1:12 PM   LOS: 1 day   Labs: Basic Metabolic Panel: Recent Labs  Lab 12/18/22 0923 12/18/22 1712 12/19/22 0443  NA 135  --  134*  K 3.8  --  3.4*  CL 95*  --  98  CO2 23  --  25  GLUCOSE 89  --  77  BUN 25*  --  12  CREATININE 9.30*  --  5.32*  CALCIUM 9.2  --  9.1  PHOS  --  5.6*  --    Liver Function Tests: Recent Labs  Lab 12/18/22 0923  AST 15  ALT 14  ALKPHOS 156*  BILITOT 1.0  PROT 6.3*  ALBUMIN 2.9*   No results for input(s): "LIPASE", "AMYLASE" in the last 168 hours. No results for input(s): "AMMONIA" in the last 168 hours. CBC: Recent Labs  Lab 12/18/22 0923 12/19/22 0443  WBC 8.2 7.2  HGB 8.7* 7.8*  HCT 28.6* 25.6*  MCV 93.8 91.1  PLT 225 219   Cardiac Enzymes: Recent Labs  Lab 12/18/22 0923 12/18/22 1100  CKTOTAL 45 45   CBG: No results for input(s): "GLUCAP" in the last 168 hours.  Studies/Results: CT Angio Chest PE W and/or Wo Contrast  Result Date: 12/18/2022 CLINICAL DATA:  Shortness of breath EXAM: CT ANGIOGRAPHY CHEST WITH CONTRAST TECHNIQUE: Multidetector CT imaging of the chest was performed using the standard protocol during bolus administration of intravenous contrast. Multiplanar CT image reconstructions and MIPs were obtained to evaluate the vascular  anatomy. RADIATION DOSE REDUCTION: This exam was performed according to the departmental dose-optimization program which includes automated exposure control, adjustment of the mA and/or kV according to patient size and/or use of iterative reconstruction technique. CONTRAST:  60mL OMNIPAQUE IOHEXOL 350 MG/ML SOLN COMPARISON:  Chest x-ray today.  Chest CT 11/11/2022 FINDINGS: Cardiovascular: No filling defects in the pulmonary arteries to suggest pulmonary emboli. Heart is normal size. Aorta is normal caliber. Scattered aortic atherosclerosis. There appears to be stenosis in the left brachiocephalic vein and upper SVC with extensive collateral venous channels in the left chest. Aortic atherosclerosis.  Mediastinum/Nodes: Right paratracheal adenopathy with lymph node measuring up to 1.9 cm in short axis diameter on image 27, stable since prior study. No visible hilar or axillary adenopathy. Trachea and esophagus are unremarkable. Thyroid unremarkable. Lungs/Pleura: Complete consolidation of the left upper lobe, new since prior study. Previously described left upper lobe mass cannot be visualized due to complete consolidation. Moderate left pleural effusion. Dependent opacities posteriorly right lower lobe, stable and likely reflecting atelectasis or scarring. Numerous small nodules throughout the right lung, appear to be increasing in number since prior study. Upper Abdomen: No acute findings Musculoskeletal: No acute bony abnormality. Review of the MIP images confirms the above findings. IMPRESSION: No evidence of pulmonary embolus. Complete consolidation of the left upper lobe. Moderate left pleural effusion. Numerous small nodules throughout the right lung appear to be increasing in number since prior study. 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 Stable mediastinal adenopathy. Aortic Atherosclerosis (ICD10-I70.0). Electronically Signed   By: Charlett Nose M.D.   On: 12/18/2022 20:22   DG Chest 2 View  Result Date: 12/18/2022 CLINICAL DATA:  Shortness of breath. EXAM: CHEST - 2 VIEW COMPARISON:  11/16/2022. FINDINGS: Since the prior study, there is new homogeneous opacification of left upper hemithorax. There also heterogeneous opacities in the left lower hemithorax. There are increased interstitial markings in the right lung which may represent mild underlying pulmonary edema. Right lung is otherwise clear. No dense consolidation or lung collapse. Bilateral costophrenic angles are clear. Stable cardio-mediastinal silhouette. Redemonstration of vascular stent in the superior vena  cava. No acute osseous abnormalities. Partially seen lower cervical spinal fixation hardware. The soft tissues are within normal limits. IMPRESSION: *Findings favor near complete left upper lobe lung collapse. Contrast-enhanced CT scan chest and/or bronchoscopy is recommended. *Increased interstitial markings in the right lung may represent mild underlying pulmonary edema. Correlate clinically. *No pleural effusion. Electronically Signed   By: Jules Schick M.D.   On: 12/18/2022 12:02   Medications:  doxycycline (VIBRAMYCIN) IV 100 mg (12/19/22 0459)   piperacillin-tazobactam (ZOSYN)  IV 2.25 g (12/19/22 0931)   vancomycin      allopurinol  100 mg Oral Daily   amiodarone  100 mg Oral Daily   apixaban  2.5 mg Oral BID   Chlorhexidine Gluconate Cloth  6 each Topical Q0600   dorzolamide-timolol  1 drop Left Eye BID   escitalopram  20 mg Oral Daily   famotidine  20 mg Oral BID   fluticasone furoate-vilanterol  1 puff Inhalation Daily   heparin sodium (porcine)  1,900 Units Intracatheter Once   metoCLOPramide  10 mg Oral TID AC   midodrine  15 mg Oral TID WC   mometasone-formoterol  2 puff Inhalation BID   sucroferric oxyhydroxide  500 mg Oral TID WC

## 2022-12-19 NOTE — Progress Notes (Signed)
MEWS Progress Note  Patient Details Name: Tina Watkins MRN: [id number] DOB: Mar 27, 1965 Today's Date: 12/19/2022   MEWS Flowsheet Documentation:  Assess: MEWS Score Temp: 97.8 F (36.6 C) BP: (!) 82/52 MAP (mmHg): (!) 62 Pulse Rate: (!) 53 ECG Heart Rate: 62 Resp: 20 Level of Consciousness: Alert SpO2: 100 % O2 Device: Nasal Cannula Patient Activity (if Appropriate): In bed O2 Flow Rate (L/min): 2 L/min Assess: MEWS Score MEWS Temp: 0 MEWS Systolic: 1 MEWS Pulse: 0 MEWS RR: 0 MEWS LOC: 0 MEWS Score: 1 MEWS Score Color: Green Assess: SIRS CRITERIA SIRS Temperature : 0 SIRS Respirations : 0 SIRS Pulse: 0 SIRS WBC: 0 SIRS Score Sum : 0 SIRS Temperature : 0 SIRS Pulse: 0 SIRS Respirations : 0 SIRS WBC: 0 SIRS Score Sum : 0 Assess: if the MEWS score is Yellow or Red Were vital signs accurate and taken at a resting state?: Yes Does the patient meet 2 or more of the SIRS criteria?: No MEWS guidelines implemented : No, previously yellow, continue vital signs every 4 hours        Tina Watkins K Sacheen Arrasmith 12/19/2022, 2:02 PM

## 2022-12-19 NOTE — Progress Notes (Signed)
Received patient in bed to unit.  Alert and oriented x4 Informed consent signed and in chart.   TX duration:3.5 hours  Patient tolerated well.  Transported back to the room  Alert, without acute distress.  Hand-off given to patient's nurse.   Access used: dialysis cath Access issues: line reversed. Per patient. Arterial port no blood return but can flush with out resistance.   Total UF removed: 0 Medication(s) given: heparin 2000 units bolus , heparin 1000 units mid run, heplock. 1.9cc per port Post HD VS: see table below Post HD weight: 49.4kg    12/18/22 2356  Vitals  Temp 98.1 F (36.7 C)  Temp Source Oral  BP (!) 95/57  MAP (mmHg) 69  BP Location Left Arm  BP Method Automatic  Patient Position (if appropriate) Lying  Pulse Rate 62  Pulse Rate Source Monitor  ECG Heart Rate 62  Resp (!) 24  Oxygen Therapy  SpO2 100 %  O2 Device Nasal Cannula  O2 Flow Rate (L/min) 2 L/min  Patient Activity (if Appropriate) In bed  Pulse Oximetry Type Continuous  During Treatment Monitoring  Blood Flow Rate (mL/min) 0 mL/min  Arterial Pressure (mmHg) 21.01 mmHg  Venous Pressure (mmHg) -10.3 mmHg  TMP (mmHg) 23.03 mmHg  Ultrafiltration Rate (mL/min) 179 mL/min  Dialysate Flow Rate (mL/min) 300 ml/min  Dialysate Potassium Concentration 3  Dialysate Calcium Concentration 2.5  Duration of HD Treatment -hour(s) 3.5 hour(s)  Cumulative Fluid Removed (mL) per Treatment  0.04  HD Safety Checks Performed Yes  Intra-Hemodialysis Comments Tolerated well  Post Treatment  Dialyzer Clearance Lightly streaked  Hemodialysis Intake (mL) 0 mL  Liters Processed 66.3  Fluid Removed (mL) 0 mL  Tolerated HD Treatment Yes  Post-Hemodialysis Comments goal met  Hemodialysis Catheter Right Femoral vein  Placement Date: 11/14/20   Placed prior to admission: Yes  Orientation: Right  Access Location: Femoral vein  Site Condition No complications  Blue Lumen Status Flushed;Heparin locked  Red Lumen  Status Flushed;Heparin locked  Purple Lumen Status N/A  Catheter fill solution Heparin 1000 units/ml  Catheter fill volume (Arterial) 1.9 cc  Catheter fill volume (Venous) 1.9  Post treatment catheter status Capped and Clamped      [facility]

## 2022-12-20 ENCOUNTER — Inpatient Hospital Stay (HOSPITAL_COMMUNITY): Payer: Medicare Other

## 2022-12-20 LAB — CBC
HCT: 26.9 % — ABNORMAL LOW (ref 36.0–46.0)
Hemoglobin: 8.1 g/dL — ABNORMAL LOW (ref 12.0–15.0)
MCH: 27.8 pg (ref 26.0–34.0)
MCHC: 30.1 g/dL (ref 30.0–36.0)
MCV: 92.4 fL (ref 80.0–100.0)
Platelets: 241 10*3/uL (ref 150–400)
RBC: 2.91 MIL/uL — ABNORMAL LOW (ref 3.87–5.11)
RDW: 20.9 % — ABNORMAL HIGH (ref 11.5–15.5)
WBC: 7.9 10*3/uL (ref 4.0–10.5)
nRBC: 0 % (ref 0.0–0.2)

## 2022-12-20 LAB — BASIC METABOLIC PANEL
Anion gap: 14 (ref 5–15)
BUN: 17 mg/dL (ref 6–20)
CO2: 22 mmol/L (ref 22–32)
Calcium: 9.5 mg/dL (ref 8.9–10.3)
Chloride: 94 mmol/L — ABNORMAL LOW (ref 98–111)
Creatinine, Ser: 7.44 mg/dL — ABNORMAL HIGH (ref 0.44–1.00)
GFR, Estimated: 6 mL/min — ABNORMAL LOW (ref >60)
Glucose, Bld: 87 mg/dL (ref 70–99)
Potassium: 3.9 mmol/L (ref 3.5–5.1)
Sodium: 130 mmol/L — ABNORMAL LOW (ref 135–145)

## 2022-12-20 LAB — GRAM STAIN

## 2022-12-20 LAB — BODY FLUID CELL COUNT WITH DIFFERENTIAL
Eos, Fluid: 0 %
Lymphs, Fluid: 49 %
Monocyte-Macrophage-Serous Fluid: 6 % — ABNORMAL LOW (ref 50–90)
Neutrophil Count, Fluid: 44 % — ABNORMAL HIGH (ref 0–25)
Other Cells, Fluid: 1 %
Total Nucleated Cell Count, Fluid: 340 uL (ref 0–1000)

## 2022-12-20 LAB — EXPECTORATED SPUTUM ASSESSMENT W GRAM STAIN, RFLX TO RESP C

## 2022-12-20 LAB — ALBUMIN, PLEURAL OR PERITONEAL FLUID: Albumin, Fluid: <1.5 g/dL

## 2022-12-20 LAB — GLUCOSE, PLEURAL OR PERITONEAL FLUID: Glucose, Fluid: 86 mg/dL

## 2022-12-20 LAB — AMYLASE, PLEURAL OR PERITONEAL FLUID: Amylase, Fluid: 34 U/L

## 2022-12-20 LAB — LACTATE DEHYDROGENASE, PLEURAL OR PERITONEAL FLUID: LD, Fluid: 54 U/L — ABNORMAL HIGH (ref 3–23)

## 2022-12-20 MED ORDER — GUAIFENESIN 100 MG/5ML PO LIQD
5.0000 mL | ORAL | Status: DC | PRN
  Administered 2022-12-20 – 2022-12-24 (×3): 5 mL via ORAL
  Filled 2022-12-20 (×3): qty 5

## 2022-12-20 MED ORDER — TRIMETHOBENZAMIDE HCL 100 MG/ML IM SOLN
200.0000 mg | Freq: Three times a day (TID) | INTRAMUSCULAR | Status: DC | PRN
  Administered 2022-12-20 – 2022-12-23 (×4): 200 mg via INTRAMUSCULAR
  Filled 2022-12-20 (×6): qty 2

## 2022-12-20 MED ORDER — LIDOCAINE HCL (PF) 1 % IJ SOLN
10.0000 mL | Freq: Once | INTRAMUSCULAR | Status: AC
  Administered 2022-12-20: 10 mL via INTRADERMAL

## 2022-12-20 NOTE — Progress Notes (Signed)
Subjective: Seen in room, woken from sleep, "feeling better for admit."  Denies shortness of breath or chest pain noted pulmonary asking IR thoracentesis mod L Effusion   Objective Vital signs in last 24 hours: Vitals:   12/19/22 2035 12/20/22 0535 12/20/22 0740 12/20/22 0840  BP: (!) 85/55 (!) 82/50 (!) 78/46 (!) 78/49  Pulse:  (!) 52 (!) 49 (!) 47  Resp: 17 18 17 17   Temp: 98 F (36.7 C) 97.8 F (36.6 C) 97.9 F (36.6 C)   TempSrc: Oral Oral Oral   SpO2: 100% 100% 100% 100%  Weight:      Height:       Weight change:   Physical Exam: General: Alert NAD chronically ill-appearing female Heart: RRR no MRG Lungs: Left side soft crackles right clear nonlabored breathing Abdomen: NABS, soft NTND Extremities: No pedal edema Dialysis Access: R Fem TDC    OP HD: TTS G-O  4h  400/700  48.9kg   2/2 bath  R fem TDC   Heparin 2000  - last HD 11/12, post wt 49.3kg - rocaltrol 2.75 mcg - venofer 50 mg weekly iv   Problem/Plan: Cute hypoxic respiratory failure secondary to CAP vs HCAP vs post-obstructive PNA vs LUL lung collapse/left pleural effusion Malignant lung nodule of LUL= plan per admit team /pulmonary consult/ (asking IR for thoracentesis )antibiotics vancomycin Zosyn doxycycline, BC drawn pending ESRD -HD TTS, had dialysis late 11/15 into 11/16a.m., (no excess volume , no UF  HD ) K3.4 , this a.m. K3.9 ,no current HD .  Next HD Tues.11/19 HTN/vol= acute on chronic hypotension improved  some with fluid resection in ER.  On home midodrine 15 mg 3 times daily-  Left upper lobe lung nodule with recent diagnosis of malignancy= per admit team /critical care/oncology consulted COPD= on bronchodilators /52.5-pack-year smoking history Anemia of ESRD-Hb 8.1 follow-up trend transfuse Hgb below 7, no ESA 2/2 lung cancer diagnosis Secondary hyperparathyroidism -calcium 9.5 , phosphorus 5.6 continue Rocaltrol with dialysis and Velphoro binders with food  Lenny Pastel, PA-C [facility] [phone] 12/20/2022,8:50 AM  LOS: 2 days   Labs: Basic Metabolic Panel: Recent Labs  Lab 12/18/22 0923 12/18/22 1712 12/19/22 0443 12/20/22 0633  NA 135  --  134* 130*  K 3.8  --  3.4* 3.9  CL 95*  --  98 94*  CO2 23  --  25 22  GLUCOSE 89  --  77 87  BUN 25*  --  12 17  CREATININE 9.30*  --  5.32* 7.44*  CALCIUM 9.2  --  9.1 9.5  PHOS  --  5.6*  --   --    Liver Function Tests: Recent Labs  Lab 12/18/22 0923  AST 15  ALT 14  ALKPHOS 156*  BILITOT 1.0  PROT 6.3*  ALBUMIN 2.9*   No results for input(s): "LIPASE", "AMYLASE" in the last 168 hours. No results for input(s): "AMMONIA" in the last 168 hours. CBC: Recent Labs  Lab 12/18/22 0923 12/19/22 0443 12/20/22 0633  WBC 8.2 7.2 7.9  HGB 8.7* 7.8* 8.1*  HCT 28.6* 25.6* 26.9*  MCV 93.8 91.1 92.4  PLT 225 219 241   Cardiac Enzymes: Recent Labs  Lab 12/18/22 0923 12/18/22 1100  CKTOTAL 45 45   CBG: No results for input(s): "GLUCAP" in the last 168 hours.  Studies/Results: CT Angio Chest PE W and/or Wo Contrast  Result Date: 12/18/2022 CLINICAL DATA:  Shortness of breath EXAM: CT ANGIOGRAPHY CHEST WITH CONTRAST TECHNIQUE: Multidetector  CT imaging of the chest was performed using the standard protocol during bolus administration of intravenous contrast. Multiplanar CT image reconstructions and MIPs were obtained to evaluate the vascular anatomy. RADIATION DOSE REDUCTION: This exam was performed according to the departmental dose-optimization program which includes automated exposure control, adjustment of the mA and/or kV according to patient size and/or use of iterative reconstruction technique. CONTRAST:  60mL OMNIPAQUE IOHEXOL 350 MG/ML SOLN COMPARISON:  Chest x-ray today.  Chest CT 11/11/2022 FINDINGS: Cardiovascular: No filling defects in the pulmonary arteries to suggest pulmonary emboli. Heart is normal size. Aorta is normal caliber. Scattered aortic atherosclerosis. There appears to be  stenosis in the left brachiocephalic vein and upper SVC with extensive collateral venous channels in the left chest. Aortic atherosclerosis. Mediastinum/Nodes: Right paratracheal adenopathy with lymph node measuring up to 1.9 cm in short axis diameter on image 27, stable since prior study. No visible hilar or axillary adenopathy. Trachea and esophagus are unremarkable. Thyroid unremarkable. Lungs/Pleura: Complete consolidation of the left upper lobe, new since prior study. Previously described left upper lobe mass cannot be visualized due to complete consolidation. Moderate left pleural effusion. Dependent opacities posteriorly right lower lobe, stable and likely reflecting atelectasis or scarring. Numerous small nodules throughout the right lung, appear to be increasing in number since prior study. Upper Abdomen: No acute findings Musculoskeletal: No acute bony abnormality. Review of the MIP images confirms the above findings. IMPRESSION: No evidence of pulmonary embolus. Complete consolidation of the left upper lobe. Moderate left pleural effusion. Numerous small nodules throughout the right lung appear to be increasing in number since prior study. 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 Stable mediastinal adenopathy. Aortic Atherosclerosis (ICD10-I70.0). Electronically Signed   By: Charlett Nose M.D.   On: 12/18/2022 20:22   DG Chest 2 View  Result Date: 12/18/2022 CLINICAL DATA:  Shortness of breath. EXAM: CHEST - 2 VIEW COMPARISON:  11/16/2022. FINDINGS: Since the prior study, there is new homogeneous opacification of left upper hemithorax. There also heterogeneous opacities in the left lower hemithorax. There are increased interstitial markings in the right lung which may represent mild underlying pulmonary edema. Right lung is otherwise clear. No dense consolidation or lung  collapse. Bilateral costophrenic angles are clear. Stable cardio-mediastinal silhouette. Redemonstration of vascular stent in the superior vena cava. No acute osseous abnormalities. Partially seen lower cervical spinal fixation hardware. The soft tissues are within normal limits. IMPRESSION: *Findings favor near complete left upper lobe lung collapse. Contrast-enhanced CT scan chest and/or bronchoscopy is recommended. *Increased interstitial markings in the right lung may represent mild underlying pulmonary edema. Correlate clinically. *No pleural effusion. Electronically Signed   By: Jules Schick M.D.   On: 12/18/2022 12:02   Medications:  doxycycline (VIBRAMYCIN) IV 100 mg (12/20/22 7829)   piperacillin-tazobactam (ZOSYN)  IV 2.25 g (12/19/22 2326)    allopurinol  100 mg Oral Daily   amiodarone  100 mg Oral Daily   apixaban  2.5 mg Oral BID   Chlorhexidine Gluconate Cloth  6 each Topical Q0600   dorzolamide-timolol  1 drop Left Eye BID   escitalopram  20 mg Oral Daily   famotidine  10 mg Oral Daily   fluticasone furoate-vilanterol  1 puff Inhalation Daily   heparin sodium (porcine)  1,900 Units Intracatheter Once   metoCLOPramide  10 mg Oral TID AC   midodrine  15 mg Oral TID WC  mometasone-formoterol  2 puff Inhalation BID   sucroferric oxyhydroxide  500 mg Oral TID WC

## 2022-12-20 NOTE — Progress Notes (Signed)
HD#2 Subjective:   Summary: 57 year old female with a past medical history of left upper lobe malignant nodule, ESRD on HD, anemia of chronic disease who presents with dyspnea and productive cough and admitted for acute hypoxemic respiratory failure in the setting of postobstructive pneumonia.  Overnight Events: No acute events overnight  Patient evaluated at bedside this morning.  She states her cough is improved, but her shortness of breath is still is present.  She states she has been up and walking, and has been tired.  She denies any fevers or chills.  She states her left toe pain has improved.  Objective:  Vital signs in last 24 hours: Vitals:   12/19/22 1252 12/19/22 1541 12/19/22 2035 12/20/22 0535  BP: (!) 82/52 (!) 81/58 (!) 85/55 (!) 82/50  Pulse: (!) 53 (!) 51  (!) 52  Resp: 20 18 17 18   Temp: 97.8 F (36.6 C) 97.6 F (36.4 C) 98 F (36.7 C) 97.8 F (36.6 C)  TempSrc: Oral Oral Oral Oral  SpO2:  100% 100% 100%  Weight:      Height:       Supplemental O2: Nasal Cannula SpO2: 100 % O2 Flow Rate (L/min): 2 L/min   Physical Exam:  Constitutional: Likely ill-appearing, resting in bed, nasal cannula in place Cardiovascular: Bradycardic into the 50s, no murmurs, rubs, or gallops Pulm: Improving breath sounds bilaterally, still decreased breath sound noted to left upper lobe lung field Extremities: Left great toe with no tenderness appreciated, 2+ pedal pulses laterally, no swelling appreciated, no erythema appreciated  Filed Weights   12/18/22 1059 12/18/22 1951 12/19/22 0000  Weight: 49.9 kg 49.2 kg 49.4 kg     Intake/Output Summary (Last 24 hours) at 12/20/2022 0640 Last data filed at 12/20/2022 0405 Gross per 24 hour  Intake 1934.2 ml  Output --  Net 1934.2 ml   Net IO Since Admission: 3,550.55 mL [12/20/22 0640]  Pertinent Labs:    Latest Ref Rng & Units 12/19/2022    4:43 AM 12/18/2022    9:23 AM 11/16/2022    6:36 AM  CBC  WBC 4.0 - 10.5  K/uL 7.2  8.2    Hemoglobin 12.0 - 15.0 g/dL 7.8  8.7  8.2   Hematocrit 36.0 - 46.0 % 25.6  28.6  24.0   Platelets 150 - 400 K/uL 219  225         Latest Ref Rng & Units 12/19/2022    4:43 AM 12/18/2022    9:23 AM 11/16/2022    6:36 AM  CMP  Glucose 70 - 99 mg/dL 77  89  85   BUN 6 - 20 mg/dL 12  25  59   Creatinine 0.44 - 1.00 mg/dL 4.09  8.11  91.47   Sodium 135 - 145 mmol/L 134  135  130   Potassium 3.5 - 5.1 mmol/L 3.4  3.8  5.3   Chloride 98 - 111 mmol/L 98  95  94   CO2 22 - 32 mmol/L 25  23    Calcium 8.9 - 10.3 mg/dL 9.1  9.2    Total Protein 6.5 - 8.1 g/dL  6.3    Total Bilirubin <1.2 mg/dL  1.0    Alkaline Phos 38 - 126 U/L  156    AST 15 - 41 U/L  15    ALT 0 - 44 U/L  14      Imaging: No results found.  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 past medical history of left upper lobe malignant nodule, ESRD on HD, anemia of chronic disease who presents with dyspnea and productive cough and admitted for acute hypoxemic respiratory failure in the setting of postobstructive pneumonia.  #Acute hypoxemic respiratory failure #Postobstructive pneumonia #Malignant lung nodule of left upper lobe #Left pleural effusion Patient evaluated bedside this morning.  Patient still requiring oxygen.  She states her shortness of breath is not improving, but also she is stating that it is not worsening.  Patient is satting well on her current oxygen at 2 L.   Instructed patient to continue using incentive spirometer.  Plan for thoracentesis today.  Will follow-up studies to see if this is exudative versus transudative, but I am thinking this is likely exudative.  Only question is whether it is a malignant pleural effusion versus a parapneumonic pleural effusion. -Follow-up pleural studies -PCCM following -Continue doxycycline and Zosyn day 3, last day of doxycycline -Follow-up respiratory cultures -Monitor for fever curve and white count -Patient has follow-up appointment on 11/20 with oncology  #Acute on chronic hypotension Patient blood pressure stable at this time.  No acute concerns. -Continue home midodrine 15 mg 3 times daily -Goal MAP greater than 60, systolic greater than 80  #ESRD on HD Patient had dialysis 2 days ago, nephrology following.  Plan to have dialysis likely tomorrow. -Continue dialysis per nephrology -Continue to monitor volume status  #Acute gout flare Pain is improving with colchicine.  Will continue with current treatment. -Continue colchicine  Chronic conditions #A Flutter- Rate/Rhythm is regular at time of admission. Continue Amiodarone 100 mg daily, Eliquis 2.5 mg BID #COPD- No active exacerbation. No baseline O2 requirement. Breo Ellipta 2 puffs BID #Depression- Lexapro 20 mg daily  #Glaucoma- COSOPT eyes drops daily   #Chronic Pain- Continue Percocet 10-325 mg q4h, Flexeril 5 mg prn, Voltaren gel, Gabapentin 100 mg TID, Lidocaine patch prn  #Gastroparesis/GERD- Continue Reglan 10 mg in morning, Pepcid 10 mg BID. Hold Omeprazole 40 mg daily, Zofran 8 mg prn. #Insomnia- Ambien 5mg   Diet: Normal IVF: None,None VTE: DOAC Code: Full PT/OT recs: None, none.  Dispo: Anticipated discharge to Home in 4 days pending clinical improvment.   Modena Slater DO Internal Medicine Resident PGY-2 [phone] Please contact the on call pager after 5 pm and on weekends at [phone].

## 2022-12-20 NOTE — Plan of Care (Signed)

## 2022-12-20 NOTE — Progress Notes (Signed)
   12/20/22 0740  Assess: MEWS Score  Temp 97.9 F (36.6 C)  BP (!) 78/46 (second recheck- RN made aware)  MAP (mmHg) (!) 57  Pulse Rate (!) 49  Resp 17  SpO2 100 %  O2 Device Nasal Cannula  O2 Flow Rate (L/min) 2 L/min  Assess: MEWS Score  MEWS Temp 0  MEWS Systolic 2  MEWS Pulse 1  MEWS RR 0  MEWS LOC 0  MEWS Score 3  MEWS Score Color Yellow  Assess: if the MEWS score is Yellow or Red  Were vital signs accurate and taken at a resting state? Yes  Does the patient meet 2 or more of the SIRS criteria? No  MEWS guidelines implemented  Yes, yellow  Treat  MEWS Interventions Considered administering scheduled or prn medications/treatments as ordered  Take Vital Signs  Increase Vital Sign Frequency  Yellow: Q2hr x1, continue Q4hrs until patient remains green for 12hrs  Escalate  MEWS: Escalate Yellow: Discuss with charge nurse and consider notifying provider and/or RRT  Notify: Charge Nurse/RN  Name of Charge Nurse/RN Notified Kirtland Bouchard, RN  Assess: SIRS CRITERIA  SIRS Temperature  0  SIRS Pulse 0  SIRS Respirations  0  SIRS WBC 0  SIRS Score Sum  0   Yellow MEWS protocol started.

## 2022-12-20 NOTE — Procedures (Signed)
PROCEDURE SUMMARY:  Successful US guided left thoracentesis. Yielded 470 mL of hazy amber fluid. Patient tolerated procedure well. No immediate complications. EBL = trace  Specimen was sent for labs.  Post procedure chest X-ray pending.  Treanna Dumler S Damian Buckles PA-C 12/20/2022 9:22 AM

## 2022-12-20 NOTE — Plan of Care (Signed)
  Problem: Education: Goal: Knowledge of General Education information will improve Description: Including pain rating scale, medication(s)/side effects and non-pharmacologic comfort measures Outcome: Progressing   Problem: Health Behavior/Discharge Planning: Goal: Ability to manage health-related needs will improve Outcome: Progressing   Problem: Nutrition: Goal: Adequate nutrition will be maintained Outcome: Progressing   Problem: Activity: Goal: Risk for activity intolerance will decrease Outcome: Progressing   Problem: Pain Management: Goal: General experience of comfort will improve Outcome: Progressing   Problem: Safety: Goal: Ability to remain free from injury will improve Outcome: Progressing

## 2022-12-21 ENCOUNTER — Encounter (HOSPITAL_COMMUNITY): Payer: Medicare Other

## 2022-12-21 DIAGNOSIS — C3412 Malignant neoplasm of upper lobe, left bronchus or lung: Secondary | ICD-10-CM | POA: Diagnosis not present

## 2022-12-21 DIAGNOSIS — J189 Pneumonia, unspecified organism: Secondary | ICD-10-CM

## 2022-12-21 DIAGNOSIS — J90 Pleural effusion, not elsewhere classified: Secondary | ICD-10-CM

## 2022-12-21 LAB — CBC
HCT: 30.7 % — ABNORMAL LOW (ref 36.0–46.0)
Hemoglobin: 9.5 g/dL — ABNORMAL LOW (ref 12.0–15.0)
MCH: 28.2 pg (ref 26.0–34.0)
MCHC: 30.9 g/dL (ref 30.0–36.0)
MCV: 91.1 fL (ref 80.0–100.0)
Platelets: 233 10*3/uL (ref 150–400)
RBC: 3.37 MIL/uL — ABNORMAL LOW (ref 3.87–5.11)
RDW: 20.6 % — ABNORMAL HIGH (ref 11.5–15.5)
WBC: 8.1 10*3/uL (ref 4.0–10.5)
nRBC: 0 % (ref 0.0–0.2)

## 2022-12-21 LAB — RENAL FUNCTION PANEL
Albumin: 2.7 g/dL — ABNORMAL LOW (ref 3.5–5.0)
Anion gap: 15 (ref 5–15)
BUN: 22 mg/dL — ABNORMAL HIGH (ref 6–20)
CO2: 21 mmol/L — ABNORMAL LOW (ref 22–32)
Calcium: 9.8 mg/dL (ref 8.9–10.3)
Chloride: 93 mmol/L — ABNORMAL LOW (ref 98–111)
Creatinine, Ser: 8.56 mg/dL — ABNORMAL HIGH (ref 0.44–1.00)
GFR, Estimated: 5 mL/min — ABNORMAL LOW (ref >60)
Glucose, Bld: 95 mg/dL (ref 70–99)
Phosphorus: 5.1 mg/dL — ABNORMAL HIGH (ref 2.5–4.6)
Potassium: 3.8 mmol/L (ref 3.5–5.1)
Sodium: 129 mmol/L — ABNORMAL LOW (ref 135–145)

## 2022-12-21 LAB — PROTEIN, TOTAL: Total Protein: 6.7 g/dL (ref 6.5–8.1)

## 2022-12-21 LAB — LACTATE DEHYDROGENASE: LDH: 131 U/L (ref 98–192)

## 2022-12-21 NOTE — Progress Notes (Signed)
[facility] Progress Note   Subjective:  Seen in room. Says she is SOB but O2 sats 100% no tachypnea. Says she feels like "I've got fluid on". Edema in hips noted, BP soft. Considering short extra treatment today.      Objective Vitals:   12/21/22 0351 12/21/22 0729 12/21/22 0739 12/21/22 0918  BP: (!) 101/57 123/72    Pulse: 60 66    Resp: 18  18   Temp: 97.7 F (36.5 C)  (!) 97.5 F (36.4 C)   TempSrc: Oral  Oral   SpO2: 100%   100%  Weight:      Height:       Physical Exam General: Chronically ill appearing female in NAD Heart: S1,S2 No M/R/G Lungs: Decreased breath sounds LLL few bibasilar crackles Abdomen: NABS, non tender Extremities: No LE edema but edema upper thighs and hips Dialysis Access: R femoral TDC Drsg intact   Additional Objective Labs: Basic Metabolic Panel: Recent Labs  Lab 12/18/22 1712 12/19/22 0443 12/20/22 0633 12/21/22 0840  NA  --  134* 130* 129*  K  --  3.4* 3.9 3.8  CL  --  98 94* 93*  CO2  --  25 22 21*  GLUCOSE  --  77 87 95  BUN  --  12 17 22*  CREATININE  --  5.32* 7.44* 8.56*  CALCIUM  --  9.1 9.5 9.8  PHOS 5.6*  --   --  5.1*   Liver Function Tests: Recent Labs  Lab 12/18/22 0923 12/21/22 0840  AST 15  --   ALT 14  --   ALKPHOS 156*  --   BILITOT 1.0  --   PROT 6.3* 6.7  ALBUMIN 2.9* 2.7*   No results for input(s): "LIPASE", "AMYLASE" in the last 168 hours. CBC: Recent Labs  Lab 12/18/22 0923 12/19/22 0443 12/20/22 0633 12/21/22 0840  WBC 8.2 7.2 7.9 8.1  HGB 8.7* 7.8* 8.1* 9.5*  HCT 28.6* 25.6* 26.9* 30.7*  MCV 93.8 91.1 92.4 91.1  PLT 225 219 241 233   Blood Culture    Component Value Date/Time   SDES EXPECTORATED SPUTUM 12/20/2022 1806   SDES EXPECTORATED SPUTUM 12/20/2022 1806   SPECREQUEST NONE 12/20/2022 1806   SPECREQUEST NONE Reflexed from [id number] 12/20/2022 1806   CULT PENDING 12/20/2022 1806   REPTSTATUS 12/20/2022 FINAL 12/20/2022 1806   REPTSTATUS PENDING 12/20/2022 1806     Cardiac Enzymes: Recent Labs  Lab 12/18/22 0923 12/18/22 1100  CKTOTAL 45 45   CBG: No results for input(s): "GLUCAP" in the last 168 hours. Iron Studies: No results for input(s): "IRON", "TIBC", "TRANSFERRIN", "FERRITIN" in the last 72 hours. @lablastinr3 @ Studies/Results: US THORACENTESIS ASP PLEURAL SPACE W/IMG GUIDE  Result Date: 12/20/2022 INDICATION: Recent diagnosis of left upper lobe malignancy with left pleural effusion. Request for diagnostic and therapeutic thoracentesis. EXAM: ULTRASOUND GUIDED LEFT THORACENTESIS MEDICATIONS: 1% lidocaine 10 mL COMPLICATIONS: None immediate. PROCEDURE: An ultrasound guided thoracentesis was thoroughly discussed with the patient and questions answered. The benefits, risks, alternatives and complications were also discussed. The patient understands and wishes to proceed with the procedure. Written consent was obtained. Ultrasound was performed to localize and mark an adequate pocket of fluid in the left chest. The area was then prepped and draped in the normal sterile fashion. 1% Lidocaine was used for local anesthesia. Under ultrasound guidance a 6 Fr Safe-T-Centesis catheter was introduced. Thoracentesis was performed. The catheter was removed and a dressing applied. FINDINGS: A total of approximately 470  mL of hazy amber fluid was removed. Samples were sent to the laboratory as requested by the clinical team. IMPRESSION: Successful ultrasound guided left thoracentesis yielding 470 mL of pleural fluid. Procedure performed by: Corrin Parker, PA-C Electronically Signed   By: Richarda Overlie M.D.   On: 12/20/2022 10:17   DG Chest 1 View  Result Date: 12/20/2022 CLINICAL DATA:  Thoracentesis EXAM: CHEST  1 VIEW COMPARISON:  12/18/2022 FINDINGS: Stable cardiomediastinal contours. SVC stent. Persistent left upper lobe collapse. Trace left pleural effusion, decreased. Increasing interstitial opacities throughout both lungs. No pneumothorax. IMPRESSION: 1.  Trace left pleural effusion, decreased. No pneumothorax. 2. Increasing interstitial opacities throughout both lungs, which may reflect edema or atypical infection. 3. Persistent left upper lobe collapse. Electronically Signed   By: Duanne Guess D.O.   On: 12/20/2022 09:48   Medications:  piperacillin-tazobactam (ZOSYN)  IV 2.25 g (12/21/22 0555)    allopurinol  100 mg Oral Daily   amiodarone  100 mg Oral Daily   apixaban  2.5 mg Oral BID   Chlorhexidine Gluconate Cloth  6 each Topical Q0600   dorzolamide-timolol  1 drop Left Eye BID   escitalopram  20 mg Oral Daily   famotidine  10 mg Oral Daily   fluticasone furoate-vilanterol  1 puff Inhalation Daily   heparin sodium (porcine)  1,900 Units Intracatheter Once   metoCLOPramide  10 mg Oral TID AC   midodrine  15 mg Oral TID WC   mometasone-formoterol  2 puff Inhalation BID   sucroferric oxyhydroxide  500 mg Oral TID WC     OP HD: TTS G-O  4h  400/700  48.9kg   2/2 bath  R fem TDC    - Heparin 2000 units IV   - last HD 11/12, post wt 49.3kg - rocaltrol 2.75 mcg PO TIW - venofer 50 mg IV weekly - NO ESA D/T new Dx lung cancer   Assessment/Plan Acute hypoxic respiratory failure secondary to L HCAP S/P bronch per pulmonary 11/16/2022.  L Pleural effusion being followed by pulmonary Malignant lung nodule of LUL-W/U per pulmonary. Oncology consulted.  ESRD-HD TTS  Next HD Tues.11/19 HTN/volume- Up almost 4 liters, edema hips, few bibasilar crackles. UF as tolerated 1st shift in AM. Order daily wts. Got to OP EDW 12/19/2022. Chronic hypotension. Continue midodrine 15 mg 3 times daily COPD= on bronchodilators /52.5-pack-year smoking history Anemia of ESRD-Hb 8.1 follow-up trend transfuse Hgb below 7, no ESA 2/2 lung cancer diagnosis Secondary hyperparathyroidism -calcium 9.5 , phosphorus 5.6 continue Rocaltrol with dialysis and Velphoro binders with food Low albumin-start protein supplements.   Tina Preston H. Willeen Novak NP-C 12/21/2022, 10:17  AM  [employer] [phone]

## 2022-12-21 NOTE — Progress Notes (Signed)
HD#3 SUBJECTIVE:  Patient Summary: Tina Watkins is a 57 y.o. female pertinent past medical history of  kidney transplant, ESRD on HD TTS, chronic anemia, chronic gout, recently diagnosed LUL malignant nodule 10/14 via bronchoscopy biopsy, chronic hypotension with daily midodrine 15mg  TID, A-flutter on chronic anticoagulation, COPD, depression, glaucoma, gastroparesis, GERD, insomnia presented to ED with dyspnea and productive cough, admitted for acute hypoxic respiratory failure in the setting of postobstructive pneumonia.  Overnight Events: None  Interim History: Patient evaluated bedside, she reports left sided back pain that is worsen when she moves, otherwise no chest pain, improving shortness of breath. No other concerns at this time.   OBJECTIVE:  Vital Signs: Vitals:   12/21/22 0351 12/21/22 0729 12/21/22 0739 12/21/22 0918  BP: (!) 101/57 123/72    Pulse: 60 66    Resp: 18  18   Temp: 97.7 F (36.5 C)  (!) 97.5 F (36.4 C)   TempSrc: Oral  Oral   SpO2: 100%   100%  Weight:      Height:       Supplemental O2: Nasal Cannula SpO2: 100 % O2 Flow Rate (L/min): 2 L/min  Filed Weights   12/18/22 1059 12/18/22 1951 12/19/22 0000  Weight: 49.9 kg 49.2 kg 49.4 kg     Intake/Output Summary (Last 24 hours) at 12/21/2022 1341 Last data filed at 12/20/2022 1500 Gross per 24 hour  Intake 50 ml  Output --  Net 50 ml   Net IO Since Admission: 3,700.55 mL [12/21/22 1341]  Physical Exam: Physical Exam  Constitutional: appears ill, somewhat uncomfortable due to pain, no acute distress Cardiovascular: regular rate, no murmurs.  Pulmonary/Chest: +decreased breath sounds LUL anteriorly, +rales LLL Abdominal: soft, non-tender, non-distended, bowel sounds present Skin: warm and dry Psych: normal mood and behavior  Patient Lines/Drains/Airways Status     Active Line/Drains/Airways     Name Placement date Placement time Site Days   Peripheral IV 12/19/22 22 G 1.75"  Right;Anterior Forearm 12/19/22  1836  Forearm  2   Fistula / Graft Left Forearm Arteriovenous vein graft 02/02/86  1200  Forearm  [id number]   Hemodialysis Catheter Right Femoral vein 11/14/20  --  Femoral vein  767             ASSESSMENT/PLAN:  Assessment: Principal Problem:   Lung cancer, upper lobe (HCC) Active Problems:   Gastroparesis   ESRD (end stage renal disease) (HCC)   Gout   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  No pneumothorax.  Increased interest opacities throughout both lungs and persistent left upper lobe collapse. PCCM following. Patient had Left thoracentesis that yielded 470 mL of hazy amber fluid 11/17; cytology pending. Pleural fluid protein was not sent to lab; LD fluid 54; LDH serum 131; total protein serum 6.7; appears to be transudate; expectorant and fluid cultures are pending. Etiology currently remains multifactorial, differentials include but not limited to liver cirrhosis vs malignancy vs ESRD  vs hypoalbuminemia vs infectious vs HCAP. Currently, will plan to treat as HCAP with continue zosyn. Patient has completed doxycycline course. Reached out to Dr. Arlana Pouch, oncologist and notified about patient's current status as pt is a scheduled appointment on 11/20; oncology will see patient on Wednesday inpatient. Spoke with Dr. Delton Coombes today, if cytology is positive, then stage IV lung  cancer; if cytology is negative then future plans for TTNA of the LUL nodule after clearing of the pneumonia.   Plan: - Follow up on pleural fluid and expectorant cultures - Continue Zosyn day 4/7; completed doxy 3/3 - Follow up cytology results - Appreciate PCCM recommendations  - Oncology consulted, will see pt on Wednesday inpatient.   # Acute on chronic hypotension Patient blood pressures have been stable with systolic 100-120s. No acute concerns at this time.  -Continue home midodrine 50 mg 3 times daily -Plan for goal MAP greater than 60, systolic rhythm 80  # ESRD on hemodialysis TTS Patient with history of failed kidney transplant, ultimately leading to ESRD, currently on HD TTS; last session on 11/15-11/16, nephrology plans for next HD 11/19  #Acute gout flare Symptoms are resolving with colchicine. Will continue to monitor.  Chronic conditions #A Flutter- Rate/Rhythm is regular at time of admission. Continue Amiodarone 100 mg daily, Eliquis 2.5 mg BID #COPD- No active exacerbation. Breo Ellipta 2 puffs BID #Depression- Lexapro 20 mg daily  #Glaucoma- COSOPT eyes drops daily   #Chronic Pain- Continue Percocet 10-325 mg q4h, Flexeril 5 mg prn, Voltaren gel, Gabapentin 100 mg TID, Lidocaine patch prn  #Gastroparesis/GERD- Continue Reglan 10 mg in morning, Pepcid 10 mg BID. Hold Omeprazole 40 mg daily, Zofran 8 mg prn. #Insomnia- Ambien 5mg     Best Practice: Diet: Renal diet IVF: Fluids: None , Rate: None VTE: apixaban (ELIQUIS) tablet 2.5 mg Start: 12/18/22 2200 Code: Full AB: Zosyn  Therapy Recs:  pending , DME: none Family Contact:, at bedside. DISPO: Anticipated discharge  2-4 days  to Home pending  medical management .  Signature: Jeral Pinch, D.O.  Internal Medicine Resident, PGY-1 [facility]  Pager: [phone] 1:41 PM, 12/21/2022   Please contact the on call pager after 5 pm and on weekends at [phone].

## 2022-12-21 NOTE — Progress Notes (Signed)
NAME:  Tina Watkins, MRN:  [id number], DOB:  Jun 05, 1965, LOS: 3 ADMISSION DATE:  12/18/2022, CONSULTATION DATE:  12/18/22 REFERRING MD:  Jearld Fenton CHIEF COMPLAINT:  Aches, generalized weakness   History of Present Illness:  Tina Watkins is a 57 y.o. female who has a PMH as below including but not limited to ESRD on HD TTS (last Tuesday 11/12 and missed Thurs 11/14 due to illness), chronic hypotension with SBP in the 90s and on daily Midodrine, recent diagnosis lung CA (LUL nodule s/p navigational bronchoscopy biopsy on 11/16/22 with Dr. Delton Coombes with positive cytology, referred to medical oncology but has yet to see them), COPD, tobacco dependence, dCHF, A.fib on Eliquis, Cirrhosis, Pancreatic tal cystic lesion.  She presented to [facility] ED 11/15 with cough productive of yellow sputum, aches, generalized weakness and feeling bad x 1 week. Symptoms started mildly a week or so prior and persisted. She saw pulmonary as an outpatient on 11/11 and was prescribed Augmentin. She had COVID test performed at dialysis on 11/12 and also by her pain provider this week which were both negative. She denies any recent travel or exposure to known sick contacts. Denies any fevers/chills/sweats, N/V.  In ED, she was noted to have SBP in the 80s initially then later in the 70s. She re-iterated that her baseline SBP is in the 90s chronically. She mainly felt generalized weakness, sore throat, ongoing cough. COVID and flu A/B were negative as was GAS throat swab. CXR showed left sided PNA. CT chest pending.  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; Tobacco abuse; History of CVA (cerebrovascular accident); GERD  (gastroesophageal reflux disease); History of deep vein thrombosis (DVT) of right lower extremity; DDD (degenerative disc disease), cervical; Neuropathy; History of COVID-19, most recently 09/2022; Hypotension; CHF (congestive heart failure) (HCC); Pneumonia of left upper lobe due to infectious organism; Other pancytopenia (HCC); Chronic pain of both knees; Atrial flutter (HCC); Hypercoagulable state due to atrial fibrillation (HCC); Aortic atherosclerosis (HCC); Acute respiratory failure with hypoxia (HCC); Cervical radiculitis; Coagulation defect, unspecified (HCC); Bilateral shoulder pain; Bilateral hand swelling; Bilateral hip pain; Atrial fibrillation (HCC); Shortness of breath; Lung cancer, upper lobe (HCC); Hepatic cirrhosis (HCC) - radiographic finding; and Pulmonary hypertension (HCC)- radiographic finding; R heart cath unsuccessful on their problem list.  Significant Hospital Events: Including procedures, antibiotic start and stop dates in addition to other pertinent events   11/15 admit Left thoracentesis 11/17 >>   Interim History / Subjective:   Called may be slightly improved Still with left-sided chest discomfort  Objective:  Blood pressure 123/72, pulse 66, temperature (!) 97.5 F (36.4 C), temperature source Oral, resp. rate 18, height 4\' 11"  (1.499 m), weight 49.4 kg, SpO2 100%.        Intake/Output Summary (Last 24 hours) at 12/21/2022 1000 Last data filed at 12/20/2022 1500 Gross per 24 hour  Intake 150 ml  Output --  Net 150 ml   Filed Weights   12/18/22 1059 12/18/22 1951 12/19/22 0000  Weight: 49.9 kg 49.2 kg 49.4 kg    Examination: Gen:      Chronically ill-appearing woman laying in bed no distress HEENT: Oropharynx clear, no lesions Neck:     No lymphadenopathy Lungs:    Coarse and decreased  on the left, clear on the right CV:         Regular, borderline tachycardic, no murmur Abd:      Nondistended with positive bowel sounds Ext:    No edema Skin:       No rash Neuro: Sleepy but wakes easily and interacts.  Oriented x 3, good strength Psych: Normal affect  Labs/imaging personally reviewed:  CT chest 11/15 > dense left lung consolidation, moderate left effusion  Assessment/Plan:   Presumed left HCAP post bronchoscopy 10/14 There was some proximal left upper lobe airway narrowing on her bronchoscopy 11/16/2022 but this was cytology negative on endobronchial brushings Left pleural effusion.  Question parapneumonic, question malignant -Doxycycline completed, continue and complete Zosyn course -Following cultures -Pleural fluid protein was not sent but LDH looks like a transudate, Gram stain is negative.  Cytology is pending.  Hx COPD with ongoing tobacco dependence. -Agree with Dulera -Albuterol as needed -Will need tobacco cessation -Outpatient follow-up with pulmonary  Chronic hypotension - at risk for worsening with presumed HCAP. -Follow volume status, careful with volume removal with HD -Continue midodrine as ordered  ESRD on HD TTS  -HD as per nephrology plans  Recently diagnosed LUL malignancy.  Unfortunately while left upper lobe nodule transbronchial biopsies did show malignant cells there is no further data, no apparent IHC. -Patient needs to see and begin to formulate plan with oncology -Follow pleural fluid results.  If cytology positive then this would represent stage IV disease.  Positive cytology would also allow [name] to do IHC and get a more definitive pathological diagnosis.  If the cytology is negative then I suspect oncology will want a repeat biopsy of the left upper lobe nodule since bronchoscopy did not cell type, origin.  If so then I would recommend a TTNA of the left upper lobe nodule.  The bronchoscopy did not give enough information and repeat bronchoscopy does carry risk especially since she developed postprocedural pneumonia after initial attempt.  The left upper lobe nodule is currently obscured by pneumonia so  if TTNA is required then she will likely need to be reimaged after several weeks to allow identification of the nodule as a biopsy target   Best practice (evaluated daily):  Per primary.  Signature:   Levy Pupa, MD, PhD 12/21/2022, 10:11 AM [facility] [phone] or if no answer before 7:00PM call [phone] For any issues after 7:00PM please call eLink [phone]

## 2022-12-21 NOTE — Care Management Important Message (Signed)
Important Message  Patient Details  Name: Tina Watkins MRN: [id number] Date of Birth: 12/06/1965   Important Message Given:  Yes - Medicare IM     Sherilyn Banker 12/21/2022, 2:51 PM

## 2022-12-21 NOTE — Plan of Care (Signed)

## 2022-12-21 NOTE — Progress Notes (Signed)
PT Cancellation Note  Patient Details Name: Tina Watkins MRN: [id number] DOB: May 15, 1965   Cancelled Treatment:    Reason Eval/Treat Not Completed: Other (comment). On first attempt pt eating dinner. On second attempt pt in bathroom vomiting. Will continue attempts.    Angelina Ok [facility] 12/21/2022, 6:16 PM Skip Mayer PT Acute [employer] [phone]

## 2022-12-21 NOTE — TOC Initial Note (Signed)
Transition of Care (TOC) - Initial/Assessment Note   Spoke to patient at bedside. Patient from home with fiance [name].   PCP Angus Seller  Patient has walker , cane and shower chair at home.   She has gone to OP PT in past .   Patient currently on oxygen. Patient does not have oxygen at home. If needed will need MD order and ambulation oxygen  saturation note   TOC Team will continue to follow for discharge needs.  Patient Details  Name: Tina Watkins MRN: [id number] Date of Birth: Mar 10, 1965  Transition of Care [facility]) CM/SW Contact:    Kingsley Plan, RN Phone Number: 12/21/2022, 3:31 PM  Clinical Narrative:                   Expected Discharge Plan: Home/Self Care Barriers to Discharge: Continued Medical Work up   Patient Goals and CMS Choice Patient states their goals for this hospitalization and ongoing recovery are:: to return to home          Expected Discharge Plan and Services   Discharge Planning Services: CM Consult Post Acute Care Choice: NA Living arrangements for the past 2 months: Apartment                   DME Agency: NA       HH Arranged: NA          Prior Living Arrangements/Services Living arrangements for the past 2 months: Apartment Lives with:: Significant Other Patient language and need for interpreter reviewed:: Yes Do you feel safe going back to the place where you live?: Yes      Need for Family Participation in Patient Care: Yes (Comment) Care giver support system in place?: Yes (comment) Current home services: DME Criminal Activity/Legal Involvement Pertinent to Current Situation/Hospitalization: No - Comment as needed  Activities of Daily Living   ADL Screening (condition at time of admission) Independently performs ADLs?: Yes (appropriate for developmental age) Is the patient deaf or have difficulty hearing?: No Does the patient have difficulty seeing, even when wearing glasses/contacts?: No Does the patient  have difficulty concentrating, remembering, or making decisions?: No  Permission Sought/Granted   Permission granted to share information with : No              Emotional Assessment Appearance:: Appears stated age Attitude/Demeanor/Rapport: Engaged Affect (typically observed): Accepting Orientation: : Oriented to Self, Oriented to Place, Oriented to  Time, Oriented to Situation Alcohol / Substance Use: Not Applicable Psych Involvement: No (comment)  Admission diagnosis:  Shortness of breath [R06.02] Acute respiratory failure with hypoxia (HCC) [J96.01] Hypotension, unspecified hypotension type [I95.9] Pneumonia of left upper lobe due to infectious organism [J18.9] Patient Active Problem List   Diagnosis Date Noted   Hepatic cirrhosis (HCC) - radiographic finding 12/19/2022   Pulmonary hypertension (HCC)- radiographic finding; R heart cath unsuccessful 12/19/2022   Shortness of breath 12/18/2022   Lung cancer, upper lobe (HCC) 10/28/2022   Atrial fibrillation (HCC) 07/01/2022   Bilateral hip pain 01/14/2022   Bilateral shoulder pain 06/06/2021   Bilateral hand swelling 06/06/2021   Cervical radiculitis 01/24/2021   Acute respiratory failure with hypoxia (HCC) 10/23/2020   Coagulation defect, unspecified (HCC) 08/01/2020   Aortic atherosclerosis (HCC) 07/10/2020   Hypercoagulable state due to atrial fibrillation (HCC) 04/23/2020   Atrial flutter (HCC) 03/29/2020   Chronic pain of both knees 03/13/2020   Other pancytopenia (HCC)    Pneumonia of left upper lobe due to infectious  organism 08/05/2019   History of COVID-19, most recently 09/2022 05/09/2019   Hypotension 05/09/2019   CHF (congestive heart failure) (HCC) 05/09/2019   DDD (degenerative disc disease), cervical 12/06/2018   Neuropathy 12/06/2018   Tobacco abuse 11/20/2017   History of CVA (cerebrovascular accident) 11/20/2017   GERD (gastroesophageal reflux disease) 11/20/2017   History of deep vein thrombosis  (DVT) of right lower extremity 11/20/2017   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    Cervical stenosis of spinal canal 01/08/2015   Anemia in chronic kidney disease 06/15/2014   History of kidney transplant    Chronic pain syndrome 04/09/2012   Gout 06/23/2011   Herpes infection 06/23/2011   Anxiety 06/23/2011   ESRD (end stage renal disease) (HCC) 06/12/2011   History of renal transplantation 05/22/2011   Gastroparesis 04/24/2008   PCP:  Ivonne Andrew, NP Pharmacy:   [phone] - [place], [place] - [address] AT [facility] [address] [place] [address] Phone: [phone] Fax: [phone]  [id number] [place] Otto, [place] - [address] AT [facility] OF [address] [place] [address] Phone: [phone] Fax: [phone]  [facility] [address] [place] [address] Phone: [phone] Fax: [phone]     Social Determinants of Health (SDOH) Social History: SDOH Screenings   Food Insecurity: No Food Insecurity (12/18/2022)  Housing: Low Risk  (12/18/2022)  Transportation Needs: No Transportation Needs (12/18/2022)  Utilities: Not At Risk (12/18/2022)  Alcohol Screen: Low Risk  (06/21/2022)  Depression (PHQ2-9): Medium Risk (06/25/2022)  Financial Resource Strain: Low Risk  (06/25/2022)  Physical Activity: Inactive (06/25/2022)  Social Connections: Socially Isolated (06/25/2022)  Stress: Stress Concern Present (06/25/2022)  Tobacco Use: High Risk (12/18/2022)   SDOH Interventions:     Readmission Risk Interventions     No data to display

## 2022-12-22 DIAGNOSIS — J90 Pleural effusion, not elsewhere classified: Secondary | ICD-10-CM | POA: Diagnosis not present

## 2022-12-22 DIAGNOSIS — C3412 Malignant neoplasm of upper lobe, left bronchus or lung: Secondary | ICD-10-CM | POA: Diagnosis not present

## 2022-12-22 LAB — HEPATIC FUNCTION PANEL
ALT: 12 U/L (ref 0–44)
AST: 26 U/L (ref 15–41)
Albumin: 2.7 g/dL — ABNORMAL LOW (ref 3.5–5.0)
Alkaline Phosphatase: 141 U/L — ABNORMAL HIGH (ref 38–126)
Bilirubin, Direct: <0.1 mg/dL (ref 0.0–0.2)
Total Bilirubin: 0.5 mg/dL (ref <1.2)
Total Protein: 6.2 g/dL — ABNORMAL LOW (ref 6.5–8.1)

## 2022-12-22 LAB — RENAL FUNCTION PANEL
Albumin: 2.4 g/dL — ABNORMAL LOW (ref 3.5–5.0)
Anion gap: 13 (ref 5–15)
BUN: 28 mg/dL — ABNORMAL HIGH (ref 6–20)
CO2: 22 mmol/L (ref 22–32)
Calcium: 8.7 mg/dL — ABNORMAL LOW (ref 8.9–10.3)
Chloride: 93 mmol/L — ABNORMAL LOW (ref 98–111)
Creatinine, Ser: 9.9 mg/dL — ABNORMAL HIGH (ref 0.44–1.00)
GFR, Estimated: 4 mL/min — ABNORMAL LOW (ref >60)
Glucose, Bld: 79 mg/dL (ref 70–99)
Phosphorus: 6 mg/dL — ABNORMAL HIGH (ref 2.5–4.6)
Potassium: 3.7 mmol/L (ref 3.5–5.1)
Sodium: 128 mmol/L — ABNORMAL LOW (ref 135–145)

## 2022-12-22 LAB — CBC
HCT: 24.8 % — ABNORMAL LOW (ref 36.0–46.0)
Hemoglobin: 7.8 g/dL — ABNORMAL LOW (ref 12.0–15.0)
MCH: 28.4 pg (ref 26.0–34.0)
MCHC: 31.5 g/dL (ref 30.0–36.0)
MCV: 90.2 fL (ref 80.0–100.0)
Platelets: 201 10*3/uL (ref 150–400)
RBC: 2.75 MIL/uL — ABNORMAL LOW (ref 3.87–5.11)
RDW: 20.4 % — ABNORMAL HIGH (ref 11.5–15.5)
WBC: 8.1 10*3/uL (ref 4.0–10.5)
nRBC: 0 % (ref 0.0–0.2)

## 2022-12-22 LAB — HEPATITIS B SURFACE ANTIBODY, QUANTITATIVE: Hep B S AB Quant (Post): 110 m[IU]/mL

## 2022-12-22 LAB — PROTIME-INR
INR: 1.7 — ABNORMAL HIGH (ref 0.8–1.2)
INR: 1.3 — ABNORMAL HIGH (ref 0.8–1.2)
Prothrombin Time: 19.7 s — ABNORMAL HIGH (ref 11.4–15.2)
Prothrombin Time: 16.2 s — ABNORMAL HIGH (ref 11.4–15.2)

## 2022-12-22 LAB — CYTOLOGY - NON PAP

## 2022-12-22 LAB — APTT
aPTT: >200 s (ref 24–36)
aPTT: 51 s — ABNORMAL HIGH (ref 24–36)

## 2022-12-22 MED ORDER — ANTICOAGULANT SODIUM CITRATE 4% (200MG/5ML) IV SOLN
5.0000 mL | Status: DC | PRN
Start: 2022-12-22 — End: 2022-12-22

## 2022-12-22 MED ORDER — ONDANSETRON HCL 4 MG/2ML IJ SOLN
4.0000 mg | Freq: Three times a day (TID) | INTRAMUSCULAR | Status: DC | PRN
  Administered 2022-12-22 – 2022-12-23 (×2): 4 mg via INTRAVENOUS
  Filled 2022-12-22 (×2): qty 2

## 2022-12-22 MED ORDER — SCOPOLAMINE 1 MG/3DAYS TD PT72
1.0000 | MEDICATED_PATCH | TRANSDERMAL | Status: DC
  Administered 2022-12-22: 1.5 mg via TRANSDERMAL
  Filled 2022-12-22: qty 1

## 2022-12-22 MED ORDER — HEPARIN SODIUM (PORCINE) 1000 UNIT/ML DIALYSIS: 1000.0000 [IU] | INTRAMUSCULAR | Status: DC | PRN

## 2022-12-22 MED ORDER — PENTAFLUOROPROP-TETRAFLUOROETH EX AERO
1.0000 | INHALATION_SPRAY | CUTANEOUS | Status: DC | PRN
Start: 2022-12-22 — End: 2022-12-22

## 2022-12-22 MED ORDER — HEPARIN SODIUM (PORCINE) 1000 UNIT/ML DIALYSIS
2000.0000 [IU] | Freq: Once | INTRAMUSCULAR | Status: DC
  Filled 2022-12-22: qty 2

## 2022-12-22 MED ORDER — LIDOCAINE-PRILOCAINE 2.5-2.5 % EX CREA
1.0000 | TOPICAL_CREAM | CUTANEOUS | Status: DC | PRN
Start: 2022-12-22 — End: 2022-12-22

## 2022-12-22 MED ORDER — NEPRO/CARBSTEADY PO LIQD
237.0000 mL | ORAL | Status: DC | PRN
Start: 2022-12-22 — End: 2022-12-22

## 2022-12-22 MED ORDER — ALTEPLASE 2 MG IJ SOLR: 2.0000 mg | Freq: Once | INTRAMUSCULAR | Status: DC | PRN

## 2022-12-22 MED ORDER — LIDOCAINE HCL (PF) 1 % IJ SOLN
5.0000 mL | INTRAMUSCULAR | Status: DC | PRN
Start: 2022-12-22 — End: 2022-12-22

## 2022-12-22 MED ORDER — HEPARIN SODIUM (PORCINE) 1000 UNIT/ML DIALYSIS
1000.0000 [IU] | INTRAMUSCULAR | Status: DC | PRN
Start: 2022-12-22 — End: 2022-12-22
  Administered 2022-12-22: 3800 [IU]
  Filled 2022-12-22: qty 1

## 2022-12-22 MED ORDER — HEPARIN SODIUM (PORCINE) 1000 UNIT/ML DIALYSIS
1000.0000 [IU] | INTRAMUSCULAR | Status: DC | PRN
Start: 2022-12-22 — End: 2022-12-22

## 2022-12-22 NOTE — Progress Notes (Signed)
PT Cancellation Note  Patient Details Name: Tina Watkins MRN: [id number] DOB: 05/23/1965   Cancelled Treatment:    Reason Eval/Treat Not Completed: Patient at procedure or test/unavailable (HD). Will check back later in the day.   Lyanne Co, PT  Acute Rehab Services Secure chat preferred Office [phone]    Elyse Hsu 12/22/2022, 8:46 AM

## 2022-12-22 NOTE — Progress Notes (Signed)
OT Cancellation Note  Patient Details Name: Tina Watkins MRN: [id number] DOB: 1965/04/09   Cancelled Treatment:    Reason Eval/Treat Not Completed: Patient at procedure or test/ unavailable.  Pt currently in dialysis will check back as able to proceed to OT eval.    Fardowsa Authier OTR/L 12/22/2022, 12:01 PM

## 2022-12-22 NOTE — Progress Notes (Signed)
HD#4 SUBJECTIVE:  Patient Summary: Tina Watkins is a 57 y.o. female pertinent past medical history of  kidney transplant, ESRD on HD TTS, chronic anemia, chronic gout, recently diagnosed LUL malignant nodule 10/14 via bronchoscopy biopsy, chronic hypotension with daily midodrine 15mg  TID, A-flutter on chronic anticoagulation, COPD, depression, glaucoma, gastroparesis, GERD, insomnia presented to ED with dyspnea and productive cough, admitted for acute hypoxic respiratory failure in the setting of postobstructive pneumonia.   Overnight Events: None  Interim History: Patient was evaluated as she was receiving dialysis, patient reports that she is not feeling well.  She reported 4 episodes of vomiting yesterday, nonbloody, nonbilious.  She reported nausea.  Also reported generalized abdominal pain, loose stools.  However no chills or fevers.  No chest pain or shortness of breath.  Patient reports that her cough has gotten better, she is not coughing sputum.   OBJECTIVE:  Vital Signs: Vitals:   12/21/22 1631 12/21/22 2023 12/21/22 2038 12/22/22 0431  BP: 107/71  (!) 91/56 99/67  Pulse: 65  61 61  Resp: 18  17 16   Temp: 98 F (36.7 C)  98.1 F (36.7 C) (!) 97.5 F (36.4 C)  TempSrc: Oral   Oral  SpO2: 100% 100% 100% 100%  Weight:      Height:       Supplemental O2: Nasal Cannula SpO2: 100 % O2 Flow Rate (L/min): 2 L/min  Filed Weights   12/18/22 1059 12/18/22 1951 12/19/22 0000  Weight: 49.9 kg 49.2 kg 49.4 kg    No intake or output data in the 24 hours ending 12/22/22 0549 Net IO Since Admission: 3,700.55 mL [12/22/22 0549]  Physical Exam: Physical Exam Constitutional: appears ill, somewhat uncomfortable due to pain, no acute distress Cardiovascular: regular rate, no murmurs.  Pulmonary/Chest: +decreased breath sounds LUL anteriorly.  Abdominal: soft, mild tenderness to palpation RUQ and mid-abdomen, bowel sounds present Skin: warm and dry Psych: normal mood and  behavior  Patient Lines/Drains/Airways Status     Active Line/Drains/Airways     Name Placement date Placement time Site Days   Peripheral IV 12/19/22 22 G 1.75" Right;Anterior Forearm 12/19/22  1836  Forearm  3   Peripheral IV 12/21/22 22 G 1.75" Right;Upper Arm 12/21/22  2333  Arm  1   Fistula / Graft Left Forearm Arteriovenous vein graft 02/02/86  1200  Forearm  [address]   Hemodialysis Catheter Right Femoral vein 11/14/20  --  Femoral vein  768             ASSESSMENT/PLAN:  Assessment: Principal Problem:   Lung cancer, upper lobe (HCC) Active Problems:   Gastroparesis   ESRD (end stage renal disease) (HCC)   Gout   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  No pneumothorax.  Increased interest opacities throughout both lungs and persistent left upper lobe collapse. PCCM following. Patient had Left thoracentesis 11/17 that yielded 470 mL of hazy amber fluid; Pleural fluid protein was not  sent to lab; LD fluid 54; LDH serum 131; total protein serum 6.7; appears to be transudate. Cytology pending. Fluid cultures no growth so far.     Etiology currently remains multifactorial, differentials include but not limited to liver cirrhosis vs malignancy vs ESRD vs hypoalbuminemia vs post-procedural infectious vs HCAP. Currently, will plan to treat as HCAP with continue zosyn. Patient has completed  doxycycline course.    Plan: - Follow up on cytology results - Continue Zosyn 5/7; completed doxy - Pleural Clx NG so far - PT/OT following - Ambulatory oxygen saturation  - Oncology will follow 11/20 - Appreciate PCCM recommendations   #Liver Cirrhosis MRI abdomen on 10/9 that showed severe hepatic and splenic iron deposition, coarse nodular contour of the liver consistent with cirrhosis.  Hepatitis B surface antigen non reactive, hepatitis B antibody showed immunity. AST/ALT 26, 12 respectively.  Would like to evaluate for liver cirrhosis and rule out [facility] in the setting of left-sided pleural effusion and malignant nodule of the left upper lobe. -Follow-up on PT/INR -Follow-up on right upper quadrant ultrasound  # Acute on chronic hypotension Patient blood pressures have been stable; with MAP >60s. No acute concerns at this time.  -Continue home midodrine 50 mg 3 times daily -Plan for goal MAP greater than 60, systolic rhythm 80  # ESRD on hemodialysis TTS # Hyponatremia # Hyperphosphatemia # Anemia of chronic disease Patient with history of multiple failed kidney transplants, ultimately leading to ESRD, currently on HD TTS; last session on 11/15-11/16, nephrology plans HD today.    #Acute gout flare Symptoms are resolving. Will continue to monitor.   Chronic conditions #A Flutter- Rate/Rhythm is regular at time of admission. Continue Amiodarone 100 mg daily, Eliquis 2.5 mg BID #COPD- No active exacerbation. Breo Ellipta 2 puffs BID #Depression- Lexapro 20 mg daily  #Glaucoma- COSOPT eyes drops daily   #Chronic Pain- Continue Percocet 10-325 mg q4h, Flexeril 5 mg prn, Voltaren gel, Gabapentin 100 mg TID, Lidocaine patch prn  #Gastroparesis/GERD- Continue Reglan 10 mg in morning, Pepcid 10 mg BID. Hold Omeprazole 40 mg daily, Zofran 8 mg prn. #Insomnia- Ambien 5mg     Best Practice: Diet: Renal diet IVF: Fluids: None, Rate: None VTE: apixaban (ELIQUIS) tablet 2.5 mg Start:  12/18/22 2200 Code: Full AB: Day 5 of Zosyn  Therapy Recs: Pending, DME: none Family Contact: at bedside. DISPO: Anticipated discharge  2-4 days  to  Home  pending  Medical Management .  Signature: Jeral Pinch, D.O.  Internal Medicine Resident, PGY-1 [facility]  Pager: [phone] 5:49 AM, 12/22/2022   Please contact the on call pager after 5 pm and on weekends at [phone].

## 2022-12-22 NOTE — Evaluation (Signed)
Occupational Therapy Evaluation Patient Details Name: Tina Watkins MRN: [id number] DOB: 08-28-1965 Today's Date: 12/22/2022   History of Present Illness Pt is 57 yo female who presents on 12/18/22 with cough and diffuse body aches. Pt with suspected PNA. PMH: ESRD, HD T/TH/S, Bil adhesive capsulitis, R eye blindness, chronic LBP, CHF, DDD, depression, fibromyalgia, GERD, gout, Afib, kidney transplant, peripheral neuropathy, L basal ganglia CVA, R frontal CVA, chronic hypotension, cirrhosis, COPD, gastroparesis, malignant pulmonary nodule dx 10/24.   Clinical Impression   Pt currently at min guard to min assist for transfers and selfcare sit to stand.  BP stable at 98/63 in supine with HOB elevated to 25 degrees and 123/69 in sitting.  She lives with her significant other who assists with dressing.  She is able to complete sponge baths, assist some with homemanagement, and drive herself to dialysis per her report.  Feel she will benefit from acute care OT at this time to help increase endurance, strength, and independence to return to baseline functional level.  Do not anticipate post acute OT needs.          If plan is discharge home, recommend the following: A little help with bathing/dressing/bathroom;Assistance with cooking/housework;Assist for transportation    Functional Status Assessment  Patient has had a recent decline in their functional status and demonstrates the ability to make significant improvements in function in a reasonable and predictable amount of time.  Equipment Recommendations  None recommended by OT       Precautions / Restrictions Precautions Precautions: Fall Restrictions Weight Bearing Restrictions: No Other Position/Activity Restrictions: R eye blindness      Mobility Bed Mobility Overal bed mobility: Needs Assistance Bed Mobility: Supine to Sit     Supine to sit: Min assist, HOB elevated     General bed mobility comments: Pt needed assist with  bringing trunk up from sitting.    Transfers Overall transfer level: Needs assistance Equipment used: None Transfers: Sit to/from Stand Sit to Stand: Contact guard assist                  Balance Overall balance assessment: Needs assistance   Sitting balance-Leahy Scale: Good       Standing balance-Leahy Scale: Fair Standing balance comment: Pt needs some unilateral support in standing.                           ADL either performed or assessed with clinical judgement   ADL Overall ADL's : Needs assistance/impaired Eating/Feeding: Independent;Sitting   Grooming: Wash/dry hands;Wash/dry face;Contact guard assist;Standing   Upper Body Bathing: Set up;Sitting   Lower Body Bathing: Contact guard assist;Sit to/from stand   Upper Body Dressing : Set up;Sitting   Lower Body Dressing: Contact guard assist;Sit to/from stand   Toilet Transfer: Ambulation;Minimal assistance Toilet Transfer Details (indicate cue type and reason): min hand held assist on the right Toileting- Clothing Manipulation and Hygiene: Contact guard assist;Sit to/from stand       Functional mobility during ADLs: Contact guard assist;Rolling walker (2 wheels) General ADL Comments: Pt's BP in supine at 98/63 and then in sitting at 123/69.  Min hand held assist for mobility without use of an assistive device.     Vision Baseline Vision/History:  (blind in the right eye) Ability to See in Adequate Light: 0 Adequate Patient Visual Report: No change from baseline Vision Assessment?: No apparent visual deficits     Perception Perception: Within Functional Limits  Praxis Praxis: [facility]       Pertinent Vitals/Pain Pain Assessment Pain Assessment: No/denies pain     Extremity/Trunk Assessment Upper Extremity Assessment Upper Extremity Assessment: Generalized weakness (bilateral shoulder flexion 0-100 degrees.  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  All other joints AROM  WFLs)   Lower Extremity Assessment Lower Extremity Assessment: Defer to PT evaluation   Cervical / Trunk Assessment Cervical / Trunk Assessment: Normal   Communication Communication Communication: No apparent difficulties Cueing Techniques: Verbal cues   Cognition Arousal: Alert Behavior During Therapy: WFL for tasks assessed/performed Overall Cognitive Status: Within Functional Limits for tasks assessed                                       General Comments  pt set up with cereal after session, she thinks nausea is from taking meds without a meal on her stomach. Significant other, [name], present during session. Instructed to remain sitting upright at least 20 mins after finishing food            Home Living Family/patient expects to be discharged to:: Private residence Living Arrangements: Spouse/significant other Available Help at Discharge: Family;Available 24 hours/day Type of Home: Apartment Home Access: Level entry     Home Layout: One level     Bathroom Shower/Tub: Chief Strategy Officer: Handicapped height     Home Equipment: Agricultural consultant (2 wheels);Cane - single point   Additional Comments: significant other can assist as needed. He sometimes works but does it when she is at HD      Prior Functioning/Environment Prior Level of Function : Independent/Modified Independent;Driving             Mobility Comments: does not usually need SPC or RW          OT Problem List: Decreased activity tolerance;Impaired balance (sitting and/or standing);Decreased strength      OT Treatment/Interventions: Self-care/ADL training;Therapeutic activities;DME and/or AE instruction;Patient/family education;Energy conservation    OT Goals(Current goals can be found in the care plan section) Acute Rehab OT Goals Patient Stated Goal: Pt did not state but agreeable to working with therapy OT Goal Formulation: With patient Time For Goal  Achievement: 01/05/23 Potential to Achieve Goals: Good  OT Frequency: Min 1X/week       AM-PAC OT "6 Clicks" Daily Activity     Outcome Measure Help from another person eating meals?: None Help from another person taking care of personal grooming?: A Little Help from another person toileting, which includes using toliet, bedpan, or urinal?: A Little Help from another person bathing (including washing, rinsing, drying)?: A Little Help from another person to put on and taking off regular upper body clothing?: A Little Help from another person to put on and taking off regular lower body clothing?: A Little 6 Click Score: 19   End of Session Nurse Communication: Other (comment) (BP during session)  Activity Tolerance: Patient tolerated treatment well Patient left: in bed;with call bell/phone within reach;Other (comment) (with PT in the room)  OT Visit Diagnosis: Unsteadiness on feet (R26.81)                Time: 4098-1191 OT Time Calculation (min): 28 min Charges:  OT General Charges $OT Visit: 1 Visit OT Evaluation $OT Eval Moderate Complexity: 1 Mod OT Treatments $Self Care/Home Management : 8-22 mins  Perrin Maltese, OTR/L Acute Rehabilitation Services  Office [phone] 12/22/2022

## 2022-12-22 NOTE — Progress Notes (Signed)
[facility] Progress Note   Subjective: Seen on HD. BP on soft side but alert, tolerating so far. No C/Os. Denies SOB.   Objective Vitals:   12/22/22 0431 12/22/22 0814 12/22/22 0833 12/22/22 0900  BP: 99/67 (!) 97/59 123/74 101/63  Pulse: 61 (!) 53 69 (!) 55  Resp: 16 12 19 15   Temp: (!) 97.5 F (36.4 C) 97.7 F (36.5 C)    TempSrc: Oral     SpO2: 100% 100% 100% 100%  Weight:  50.1 kg    Height:       Physical Exam General: Chronically ill appearing female in NAD Heart: S1,S2 No M/R/G Lungs: Decreased breath sounds LLL few bibasilar crackles Abdomen: NABS, non tender Extremities: No LE edema but edema upper thighs and hips Dialysis Access: R femoral TDC Drsg intact  Additional Objective Labs: Basic Metabolic Panel: Recent Labs  Lab 12/18/22 1712 12/19/22 0443 12/20/22 0633 12/21/22 0840 12/22/22 0835  NA  --    < > 130* 129* 128*  K  --    < > 3.9 3.8 3.7  CL  --    < > 94* 93* 93*  CO2  --    < > 22 21* 22  GLUCOSE  --    < > 87 95 79  BUN  --    < > 17 22* 28*  CREATININE  --    < > 7.44* 8.56* 9.90*  CALCIUM  --    < > 9.5 9.8 8.7*  PHOS 5.6*  --   --  5.1* 6.0*   < > = values in this interval not displayed.   Liver Function Tests: Recent Labs  Lab 12/18/22 0923 12/21/22 0840 12/22/22 0835  AST 15  --   --   ALT 14  --   --   ALKPHOS 156*  --   --   BILITOT 1.0  --   --   PROT 6.3* 6.7  --   ALBUMIN 2.9* 2.7* 2.4*   No results for input(s): "LIPASE", "AMYLASE" in the last 168 hours. CBC: Recent Labs  Lab 12/18/22 0923 12/19/22 0443 12/20/22 0633 12/21/22 0840 12/22/22 0839  WBC 8.2 7.2 7.9 8.1 8.1  HGB 8.7* 7.8* 8.1* 9.5* 7.8*  HCT 28.6* 25.6* 26.9* 30.7* 24.8*  MCV 93.8 91.1 92.4 91.1 90.2  PLT 225 219 241 233 201   Blood Culture    Component Value Date/Time   SDES EXPECTORATED SPUTUM 12/20/2022 1806   SDES EXPECTORATED SPUTUM 12/20/2022 1806   SPECREQUEST NONE 12/20/2022 1806   SPECREQUEST NONE Reflexed from  [id number] 12/20/2022 1806   CULT  12/20/2022 1806    TOO YOUNG TO READ Performed at [facility], [address] [place], [place] [address]    REPTSTATUS 12/20/2022 FINAL 12/20/2022 1806   REPTSTATUS PENDING 12/20/2022 1806    Cardiac Enzymes: Recent Labs  Lab 12/18/22 0923 12/18/22 1100  CKTOTAL 45 45   CBG: No results for input(s): "GLUCAP" in the last 168 hours. Iron Studies: No results for input(s): "IRON", "TIBC", "TRANSFERRIN", "FERRITIN" in the last 72 hours. @lablastinr3 @ Studies/Results: US THORACENTESIS ASP PLEURAL SPACE W/IMG GUIDE  Result Date: 12/20/2022 INDICATION: Recent diagnosis of left upper lobe malignancy with left pleural effusion. Request for diagnostic and therapeutic thoracentesis. EXAM: ULTRASOUND GUIDED LEFT THORACENTESIS MEDICATIONS: 1% lidocaine 10 mL COMPLICATIONS: None immediate. PROCEDURE: An ultrasound guided thoracentesis was thoroughly discussed with the patient and questions answered. The benefits, risks, alternatives and complications were also discussed. The patient  understands and wishes to proceed with the procedure. Written consent was obtained. Ultrasound was performed to localize and mark an adequate pocket of fluid in the left chest. The area was then prepped and draped in the normal sterile fashion. 1% Lidocaine was used for local anesthesia. Under ultrasound guidance a 6 Fr Safe-T-Centesis catheter was introduced. Thoracentesis was performed. The catheter was removed and a dressing applied. FINDINGS: A total of approximately 470 mL of hazy amber fluid was removed. Samples were sent to the laboratory as requested by the clinical team. IMPRESSION: Successful ultrasound guided left thoracentesis yielding 470 mL of pleural fluid. Procedure performed by: Corrin Parker, PA-C Electronically Signed   By: Richarda Overlie M.D.   On: 12/20/2022 10:17   DG Chest 1 View  Result Date: 12/20/2022 CLINICAL DATA:  Thoracentesis EXAM: CHEST  1 VIEW COMPARISON:   12/18/2022 FINDINGS: Stable cardiomediastinal contours. SVC stent. Persistent left upper lobe collapse. Trace left pleural effusion, decreased. Increasing interstitial opacities throughout both lungs. No pneumothorax. IMPRESSION: 1. Trace left pleural effusion, decreased. No pneumothorax. 2. Increasing interstitial opacities throughout both lungs, which may reflect edema or atypical infection. 3. Persistent left upper lobe collapse. Electronically Signed   By: Duanne Guess D.O.   On: 12/20/2022 09:48   Medications:  anticoagulant sodium citrate     anticoagulant sodium citrate     piperacillin-tazobactam (ZOSYN)  IV 2.25 g (12/22/22 0600)    allopurinol  100 mg Oral Daily   amiodarone  100 mg Oral Daily   apixaban  2.5 mg Oral BID   Chlorhexidine Gluconate Cloth  6 each Topical Q0600   dorzolamide-timolol  1 drop Left Eye BID   escitalopram  20 mg Oral Daily   famotidine  10 mg Oral Daily   fluticasone furoate-vilanterol  1 puff Inhalation Daily   heparin  2,000 Units Dialysis Once in dialysis   metoCLOPramide  10 mg Oral TID AC   midodrine  15 mg Oral TID WC   mometasone-formoterol  2 puff Inhalation BID   sucroferric oxyhydroxide  500 mg Oral TID WC     OP HD: TTS G-O  4h  400/700  48.9kg   2/2 bath  R fem TDC    - Heparin 2000 units IV   - last HD 11/12, post wt 49.3kg - rocaltrol 2.75 mcg PO TIW - venofer 50 mg IV weekly - NO ESA D/T new Dx lung cancer   Assessment/Plan Acute hypoxic respiratory failure secondary to L HCAP S/P bronch per pulmonary 11/16/2022.  L Pleural effusion being followed by pulmonary Malignant lung nodule of LUL-W/U per pulmonary. Oncology consulted.  ESRD-HD TTS  Next HD 12/24/2022 HTN/volume-Pre wt 50.1 kg. Still with edema hips, few bibasilar crackles. Na+ falling.  Attempt to lower volume in HD today.  Continue midodrine 15 mg 3 times daily COPD- on bronchodilators /52.5-pack-year smoking history Anemia of ESRD-Hb 8.1 follow-up trend transfuse  Hgb below 7, no ESA 2/2 lung cancer diagnosis Secondary hyperparathyroidism -calcium 9.5 , phosphorus 5.6 continue Rocaltrol with dialysis and Velphoro binders with food Low albumin-start protein supplements.   Tina Brecheisen H. Avonda Toso NP-C 12/22/2022, 9:36 AM  [employer] [phone]

## 2022-12-22 NOTE — Progress Notes (Signed)
Pt receives out-pt HD at [facility] on TTS. Will assist as needed.   Timera Windt Renal Navigator [phone] 

## 2022-12-22 NOTE — Evaluation (Signed)
Physical Therapy Evaluation Patient Details Name: Tina Watkins MRN: [id number] DOB: [birth year] Today's Date: 12/22/2022  History of Present Illness  Pt is 57 yo female who presents on 12/18/22 with cough and diffuse body aches. Pt with suspected PNA. PMH: ESRD, HD T/TH/S, Bil adhesive capsulitis, R eye blindness, chronic LBP, CHF, DDD, depression, fibromyalgia, GERD, gout, Afib, kidney transplant, peripheral neuropathy, L basal ganglia CVA, R frontal CVA, chronic hypotension, cirrhosis, COPD, gastroparesis, malignant pulmonary nodule dx 10/24  Clinical Impression  Pt admitted with above diagnosis. Pt from home with significant other who can help her as needed but she was independent, driving herself to HD most days. Pt presents with nausea on eval and general fatigue. Mobilizing at supervision level. SPO2 remained 94% with ambulation despite increasing SOB and need to sit and rest at 50'. HR in 80's. Will follow acutely but do not anticipate pt needing PT at d/c.  Pt currently with functional limitations due to the deficits listed below (see PT Problem List). Pt will benefit from acute skilled PT to increase their independence and safety with mobility to allow discharge.           If plan is discharge home, recommend the following: Assistance with cooking/housework   Can travel by private vehicle        Equipment Recommendations None recommended by PT  Recommendations for Other Services       Functional Status Assessment Patient has had a recent decline in their functional status and demonstrates the ability to make significant improvements in function in a reasonable and predictable amount of time.     Precautions / Restrictions Precautions Precautions: Other (comment) (chronic hypotension) Restrictions Weight Bearing Restrictions: No Other Position/Activity Restrictions: R eye blindness      Mobility  Bed Mobility               General bed mobility comments:  received EOB with OT    Transfers Overall transfer level: Needs assistance Equipment used: None Transfers: Sit to/from Stand Sit to Stand: Supervision           General transfer comment: supervision for safety    Ambulation/Gait Ambulation/Gait assistance: Supervision Gait Distance (Feet): 100 Feet Assistive device: None Gait Pattern/deviations: Step-through pattern Gait velocity: decreased Gait velocity interpretation: 1.31 - 2.62 ft/sec, indicative of limited community ambulator   General Gait Details: pt pace limited by nausea. Increasing SOB with distance and took seated rest break at 50'. SPO2 dropped to 84% on RA, HR in 80's  Stairs            Wheelchair Mobility     Tilt Bed    Modified Rankin (Stroke Patients Only)       Balance Overall balance assessment: Mild deficits observed, not formally tested                                           Pertinent Vitals/Pain Pain Assessment Pain Assessment: No/denies pain (nausea)    Home Living Family/patient expects to be discharged to:: Private residence Living Arrangements: Spouse/significant other Available Help at Discharge: Family;Available 24 hours/day Type of Home: Apartment Home Access: Level entry       Home Layout: One level Home Equipment: Agricultural consultant (2 wheels);Cane - single point Additional Comments: significant other can assist as needed. He sometimes works but does it when she is at HD    Prior Function  Prior Level of Function : Independent/Modified Independent;Driving             Mobility Comments: does not usually need SPC or RW       Extremity/Trunk Assessment   Upper Extremity Assessment Upper Extremity Assessment: Defer to OT evaluation    Lower Extremity Assessment Lower Extremity Assessment: Generalized weakness    Cervical / Trunk Assessment Cervical / Trunk Assessment: Normal  Communication   Communication Communication: No apparent  difficulties Cueing Techniques: Verbal cues  Cognition Arousal: Alert Behavior During Therapy: WFL for tasks assessed/performed Overall Cognitive Status: Within Functional Limits for tasks assessed                                          General Comments General comments (skin integrity, edema, etc.): pt set up with cereal after session, she thinks nausea is from taking meds without a meal on her stomach. Significant other, [name], present during session. Instructed to remain sitting upright at least 20 mins after finishing food    Exercises     Assessment/Plan    PT Assessment Patient needs continued PT services  PT Problem List Decreased strength;Decreased activity tolerance;Decreased mobility       PT Treatment Interventions DME instruction;Gait training;Functional mobility training;Therapeutic activities;Therapeutic exercise;Balance training;Patient/family education    PT Goals (Current goals can be found in the Care Plan section)  Acute Rehab PT Goals Patient Stated Goal: return home PT Goal Formulation: With patient Time For Goal Achievement: 01/05/23 Potential to Achieve Goals: Good    Frequency Min 1X/week     Co-evaluation               AM-PAC PT "6 Clicks" Mobility  Outcome Measure Help needed turning from your back to your side while in a flat bed without using bedrails?: None Help needed moving from lying on your back to sitting on the side of a flat bed without using bedrails?: None Help needed moving to and from a bed to a chair (including a wheelchair)?: None Help needed standing up from a chair using your arms (e.g., wheelchair or bedside chair)?: None Help needed to walk in hospital room?: A Little Help needed climbing 3-5 steps with a railing? : A Lot 6 Click Score: 21    End of Session   Activity Tolerance: Patient tolerated treatment well;Other (comment) (nausea) Patient left: in chair;with call bell/phone within  reach Nurse Communication: Mobility status PT Visit Diagnosis: Muscle weakness (generalized) (M62.81);Difficulty in walking, not elsewhere classified (R26.2)    Time: 1542-1600 PT Time Calculation (min) (ACUTE ONLY): 18 min   Charges:   PT Evaluation $PT Eval Low Complexity: 1 Low   PT General Charges $$ ACUTE PT VISIT: 1 Visit         Lyanne Co, PT  Acute Rehab Services Secure chat preferred Office [phone]   Lawana Chambers Aryel Edelen 12/22/2022, 4:58 PM

## 2022-12-22 NOTE — Progress Notes (Signed)
Received patient in bed to unit.  Alert and oriented.  Informed consent signed and in chart.   TX duration:4  Patient tolerated well.  Transported back to the room  Alert, without acute distress.  Hand-off given to patient's nurse.   Access used: catheter Access issues: n/a  Total UF removed: 2.4L Medication(s) given: Zofran, Midodrine x2   weight: 47.5kg    12/22/22 1305  Vitals  Temp 97.9 F (36.6 C)  Temp Source Oral  BP 126/67  MAP (mmHg) 84  Pulse Rate 69  ECG Heart Rate 69  Resp (!) 0  Oxygen Therapy  SpO2 100 %  O2 Device Room Air  During Treatment Monitoring  Blood Flow Rate (mL/min) 0 mL/min  Arterial Pressure (mmHg) -162.82 mmHg  Venous Pressure (mmHg) 114.13 mmHg  TMP (mmHg) 7.07 mmHg  Ultrafiltration Rate (mL/min) 0 mL/min  Dialysate Flow Rate (mL/min) 299 ml/min  Duration of HD Treatment -hour(s) 4 hour(s)  Cumulative Fluid Removed (mL) per Treatment  2351.39  HD Safety Checks Performed Yes  Intra-Hemodialysis Comments Tx completed  Post Treatment  Dialyzer Clearance Lightly streaked  Hemodialysis Intake (mL) 60 mL  Liters Processed 84  Fluid Removed (mL) 2400 mL  Tolerated HD Treatment Yes  Hemodialysis Catheter Right Femoral vein  Placement Date: 11/14/20   Placed prior to admission: Yes  Orientation: Right  Access Location: Femoral vein  Site Condition No complications  Blue Lumen Status Heparin locked  Red Lumen Status Heparin locked  Catheter fill solution Heparin 1000 units/ml  Catheter fill volume (Arterial) 1.9 cc  Catheter fill volume (Venous) 1.9  Dressing Type Transparent  Dressing Status Antimicrobial disc in place  Drainage Description None  Dressing Change Due 12/25/22  Post treatment catheter status Capped and Clamped       [facility]

## 2022-12-23 ENCOUNTER — Inpatient Hospital Stay: Payer: Medicare Other

## 2022-12-23 ENCOUNTER — Inpatient Hospital Stay: Payer: Medicare Other | Admitting: Oncology

## 2022-12-23 ENCOUNTER — Inpatient Hospital Stay (HOSPITAL_COMMUNITY): Payer: Medicare Other

## 2022-12-23 ENCOUNTER — Ambulatory Visit: Payer: Medicare Other | Admitting: Physical Therapy

## 2022-12-23 DIAGNOSIS — K869 Disease of pancreas, unspecified: Secondary | ICD-10-CM | POA: Diagnosis not present

## 2022-12-23 DIAGNOSIS — J90 Pleural effusion, not elsewhere classified: Secondary | ICD-10-CM | POA: Diagnosis not present

## 2022-12-23 DIAGNOSIS — R918 Other nonspecific abnormal finding of lung field: Secondary | ICD-10-CM | POA: Diagnosis not present

## 2022-12-23 DIAGNOSIS — C3412 Malignant neoplasm of upper lobe, left bronchus or lung: Secondary | ICD-10-CM | POA: Diagnosis not present

## 2022-12-23 LAB — CBC
HCT: 26.5 % — ABNORMAL LOW (ref 36.0–46.0)
Hemoglobin: 8.4 g/dL — ABNORMAL LOW (ref 12.0–15.0)
MCH: 28.4 pg (ref 26.0–34.0)
MCHC: 31.7 g/dL (ref 30.0–36.0)
MCV: 89.5 fL (ref 80.0–100.0)
Platelets: 181 10*3/uL (ref 150–400)
RBC: 2.96 MIL/uL — ABNORMAL LOW (ref 3.87–5.11)
RDW: 20.3 % — ABNORMAL HIGH (ref 11.5–15.5)
WBC: 6.6 10*3/uL (ref 4.0–10.5)
nRBC: 0 % (ref 0.0–0.2)

## 2022-12-23 LAB — IRON AND TIBC
Iron: 138 ug/dL (ref 28–170)
Saturation Ratios: 83 % — ABNORMAL HIGH (ref 10.4–31.8)
TIBC: 167 ug/dL — ABNORMAL LOW (ref 250–450)
UIBC: 29 ug/dL

## 2022-12-23 LAB — CULTURE, BLOOD (ROUTINE X 2)
Culture: NO GROWTH
Culture: NO GROWTH
Special Requests: ADEQUATE

## 2022-12-23 LAB — RENAL FUNCTION PANEL
Albumin: 2.4 g/dL — ABNORMAL LOW (ref 3.5–5.0)
Anion gap: 12 (ref 5–15)
BUN: 15 mg/dL (ref 6–20)
CO2: 26 mmol/L (ref 22–32)
Calcium: 9.3 mg/dL (ref 8.9–10.3)
Chloride: 95 mmol/L — ABNORMAL LOW (ref 98–111)
Creatinine, Ser: 6.21 mg/dL — ABNORMAL HIGH (ref 0.44–1.00)
GFR, Estimated: 7 mL/min — ABNORMAL LOW (ref >60)
Glucose, Bld: 79 mg/dL (ref 70–99)
Phosphorus: 4.7 mg/dL — ABNORMAL HIGH (ref 2.5–4.6)
Potassium: 3.7 mmol/L (ref 3.5–5.1)
Sodium: 133 mmol/L — ABNORMAL LOW (ref 135–145)

## 2022-12-23 LAB — CULTURE, RESPIRATORY W GRAM STAIN

## 2022-12-23 LAB — FERRITIN: Ferritin: 1933 ng/mL — ABNORMAL HIGH (ref 11–307)

## 2022-12-23 LAB — ACID FAST SMEAR (AFB, MYCOBACTERIA): Acid Fast Smear: NEGATIVE

## 2022-12-23 MED ORDER — CHLORHEXIDINE GLUCONATE CLOTH 2 % EX PADS
6.0000 | MEDICATED_PAD | Freq: Every day | CUTANEOUS | Status: DC
  Administered 2022-12-24: 6 via TOPICAL

## 2022-12-23 MED ORDER — ONDANSETRON HCL 4 MG/2ML IJ SOLN
4.0000 mg | Freq: Four times a day (QID) | INTRAMUSCULAR | Status: DC
  Administered 2022-12-23 – 2022-12-24 (×2): 4 mg via INTRAVENOUS
  Filled 2022-12-23 (×2): qty 2

## 2022-12-23 MED ORDER — CALCITRIOL 0.5 MCG PO CAPS
1.0000 ug | ORAL_CAPSULE | ORAL | Status: DC
  Administered 2022-12-24: 1 ug via ORAL
  Filled 2022-12-23: qty 2

## 2022-12-23 NOTE — Plan of Care (Signed)
  Problem: Clinical Measurements: Goal: Will remain free from infection Outcome: Progressing   Problem: Clinical Measurements: Goal: Diagnostic test results will improve Outcome: Progressing   Problem: Nutrition: Goal: Adequate nutrition will be maintained Outcome: Progressing   Problem: Coping: Goal: Level of anxiety will decrease Outcome: Progressing   Problem: Elimination: Goal: Will not experience complications related to bowel motility Outcome: Progressing   Problem: Elimination: Goal: Will not experience complications related to urinary retention Outcome: Progressing   Problem: Pain Management: Goal: General experience of comfort will improve Outcome: Progressing   Problem: Safety: Goal: Ability to remain free from injury will improve Outcome: Progressing   Problem: Skin Integrity: Goal: Risk for impaired skin integrity will decrease Outcome: Progressing

## 2022-12-23 NOTE — Plan of Care (Signed)
  Problem: Clinical Measurements: Goal: Will remain free from infection Outcome: Progressing Goal: Respiratory complications will improve Outcome: Progressing   Problem: Nutrition: Goal: Adequate nutrition will be maintained Outcome: Progressing   Problem: Safety: Goal: Ability to remain free from injury will improve Outcome: Progressing   Problem: Skin Integrity: Goal: Risk for impaired skin integrity will decrease Outcome: Progressing   

## 2022-12-23 NOTE — Progress Notes (Signed)
HD#5 SUBJECTIVE:  Patient Summary: Tina Watkins is a 57 y.o. female pertinent past medical history of multiple kidney transplants, ESRD on HD TTS, chronic anemia, chronic gout, recently diagnosed LUL malignant nodule 10/14 via bronchoscopy biopsy, chronic hypotension with daily midodrine 15mg  TID, A-flutter on chronic anticoagulation, COPD, depression, glaucoma, gastroparesis, GERD, insomnia presented to ED with dyspnea and productive cough, admitted for acute hypoxic respiratory failure in the setting of postobstructive pneumonia.   Overnight Events: None  Interim History: Patient was evaluated bedside today, she reports she is overall doing well.  Patient denied any nausea or vomiting today.  She denied any abdominal pain. She denied any fever or chills.  No chest pain or shortness of breath.  She reports that her cough has gotten better, she is hacking but no sputum production.   OBJECTIVE:  Vital Signs: Vitals:   12/22/22 1519 12/22/22 1949 12/22/22 2024 12/23/22 0438  BP: 98/63 114/67  (!) 94/54  Pulse: 68 69  (!) 55  Resp: 19 18  18   Temp: 98.2 F (36.8 C) 97.9 F (36.6 C)  98.2 F (36.8 C)  TempSrc: Oral Oral  Oral  SpO2: 98% 100% 100% 100%  Weight:      Height:       Supplemental O2: Nasal Cannula SpO2: 100 % O2 Flow Rate (L/min): 2 L/min  Filed Weights   12/18/22 1951 12/19/22 0000 12/22/22 0814  Weight: 49.2 kg 49.4 kg 50.1 kg    Intake/Output Summary (Last 24 hours) at 12/23/2022 2130 Last data filed at 12/22/2022 2142 Gross per 24 hour  Intake 120 ml  Output 2400 ml  Net -2280 ml   Net IO Since Admission: 1,420.55 mL [12/23/22 0712]  Physical Exam: Physical Exam Constitutional: appears ill though no acute distress Cardiovascular: regular rate, no murmurs.  Pulmonary/Chest: +decreased breath sounds LUL, LLL anteriorly.  Abdominal: soft, no tenderness to palpation, bowel sounds present Skin: warm and dry Psych: normal mood and behavior  Patient  Lines/Drains/Airways Status     Active Line/Drains/Airways     Name Placement date Placement time Site Days   Peripheral IV 12/21/22 22 G 1.75" Right;Upper Arm 12/21/22  2333  Arm  2   Fistula / Graft Left Forearm Arteriovenous vein graft 02/02/86  1200  Forearm  [id number]   Hemodialysis Catheter Right Femoral vein 11/14/20  --  Femoral vein  769             ASSESSMENT/PLAN:  Assessment: Principal Problem:   Lung cancer, upper lobe (HCC) Active Problems:   Gastroparesis   ESRD (end stage renal disease) (HCC)   Gout   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  No pneumothorax.  Increased interest opacities throughout both lungs and persistent left upper lobe collapse. PCCM following. Patient had Left thoracentesis 11/17 that yielded 470 mL of hazy amber fluid; Pleural fluid protein was not sent to lab; LD fluid 54; LDH serum 131; total protein serum 6.7;  appears to be transudate. Cytology showed no malignant cells; fluid cultures shows no growth x 3 days.  Respiratory culture grew few Staphylococcus hemolyticus with good sensitivities.  Engaged with the pharmacist, they recommended continuing course of Zosyn and not escalating therapy as patient is afebrile with no leukocytosis; no need to repeat chest x-ray. Plan is to continue treating with Zosyn to complete the course of antibiotic therapy.  Patient  was seen by oncology today, recommended to follow up as outpatient once repeat biopsy results. Appreciate oncology's recommendation.    Plan: - Continue Zosyn 6/7; completed doxy - PT/OT with no recommendation at this time   #Liver Cirrhosis MRI abdomen on 10/9 that showed severe hepatic and splenic iron deposition, coarse nodular contour of the liver consistent with cirrhosis.  Hepatitis B surface antigen non reactive, hepatitis B antibody showed immunity. AST/ALT 26, 12 respectively. Would like to evaluate for liver cirrhosis and rule out [facility] in the setting of left-sided pleural effusion and malignant nodule of the left upper lobe.  PTT 16.2, INR 1.3, APTT 51, alkaline phosphatase 141, normal AST/ALT.right upper quadrant ultrasound showing distended gallbladder, heterogeneous lobular appearance to the liver parenchyma with patent portal vein. -Follow-up on HCV antibody, ANA, Mitochondrial antibody   #Pancreatic tail cystic lesion MRI abdomen on 10/9 showed heterogeneous cystic lesion arising from the tip of the pancreatic tail measuring 4.4 x 2.3 x 3.3 cm concerning for a partially cystic malignancy with a significant solid component. Oncology on board, recommended outpatient follow-up with GI. -Outpatient follow-up with [facility] is scheduled on November 27 at 11:30 PM.  #Acute on chronic hypotension Patient blood pressures have been stable; with MAP >60s. No acute concerns at this time.  -Continue home midodrine 50 mg 3 times daily -Plan for goal MAP greater than 60, systolic rhythm 80   # ESRD on hemodialysis TTS # Hyponatremia # Hyperphosphatemia # Anemia of chronic disease Patient with history of multiple failed kidney transplants, ultimately leading to ESRD, currently on HD TTS; First session on 11/15-11/16, second session on 11/19 with 2.4 L removed.  Patient is planned for next session tomorrow.   #Acute gout flare Symptoms are resolving. Will continue to monitor.   Chronic  conditions #A Flutter- Rate/Rhythm is regular at time of admission. Continue Amiodarone 100 mg daily, Eliquis 2.5 mg BID #COPD- No active exacerbation. Breo Ellipta 2 puffs BID #Depression- Lexapro 20 mg daily  #Glaucoma- COSOPT eyes drops daily   #Chronic Pain- Continue Percocet 10-325 mg q4h, Flexeril 5 mg prn, Voltaren gel, Gabapentin 100 mg TID, Lidocaine patch prn  #Gastroparesis/GERD- Continue Reglan 10 mg in morning, Pepcid 10 mg BID. Hold Omeprazole 40 mg daily, Zofran 8 mg prn. #Insomnia- Ambien 5mg   Best Practice: Diet: Renal diet IVF: Fluids: None, Rate: None VTE: apixaban (ELIQUIS) tablet 2.5 mg Start: 12/18/22 2200 Code: Full AB: Zosyn day 6 Therapy Recs: None, DME: none Family Contact: at bedside. DISPO: Anticipated discharge tomorrow to Home pending  Dialysis tomorrow and last dose of IV Zosyn .  Signature: Jeral Pinch, D.O.  Internal Medicine Resident, PGY-1 [facility]  Pager: [phone] 7:12 AM, 12/23/2022   Please contact the on call pager after 5 pm and on weekends at [phone].

## 2022-12-23 NOTE — Progress Notes (Signed)
Occupational Therapy Treatment Patient Details Name: Tina Watkins MRN: [id number] DOB: 29-Jun-1965 Today's Date: 12/23/2022   History of present illness Pt is 57 yo female who presents on 12/18/22 with cough and diffuse body aches. Pt with suspected PNA. PMH: ESRD, HD T/TH/S, Bil adhesive capsulitis, R eye blindness, chronic LBP, CHF, DDD, depression, fibromyalgia, GERD, gout, Afib, kidney transplant, peripheral neuropathy, L basal ganglia CVA, R frontal CVA, chronic hypotension, cirrhosis, COPD, gastroparesis, malignant pulmonary nodule dx 10/24.   OT comments  Patient demonstrating good gains with OT treatment with bed mobility and self care standing at sink. Patient able to perform mobility and transfers without an assistive device and CGA for safety . Patient with mild complaints of nausea stating due to being NPO. Discharge recommendations continue to be appropriate. Acute OT to continue to follow.       If plan is discharge home, recommend the following:  A little help with bathing/dressing/bathroom;Assistance with cooking/housework;Assist for transportation   Equipment Recommendations  None recommended by OT    Recommendations for Other Services      Precautions / Restrictions Precautions Precautions: Fall Restrictions Weight Bearing Restrictions: No Other Position/Activity Restrictions: R eye blindness       Mobility Bed Mobility Overal bed mobility: Needs Assistance Bed Mobility: Supine to Sit     Supine to sit: Contact guard, HOB elevated     General bed mobility comments: CGA for trunk    Transfers Overall transfer level: Needs assistance Equipment used: None Transfers: Sit to/from Stand Sit to Stand: Contact guard assist           General transfer comment: CGA for safety and verbal cues for hand placement     Balance Overall balance assessment: Needs assistance   Sitting balance-Leahy Scale: Good Sitting balance - Comments: able to donn socks  seated on EOB     Standing balance-Leahy Scale: Fair Standing balance comment: stood at sink for self care tasks without UE support                           ADL either performed or assessed with clinical judgement   ADL Overall ADL's : Needs assistance/impaired     Grooming: Wash/dry hands;Wash/dry face;Oral care;Supervision/safety;Standing Grooming Details (indicate cue type and reason): at sink Upper Body Bathing: Supervision/ safety;Set up;Standing Upper Body Bathing Details (indicate cue type and reason): at sink         Lower Body Dressing: Supervision/safety;Sitting/lateral leans Lower Body Dressing Details (indicate cue type and reason): to donn socks seated on EOB Toilet Transfer: Contact guard assist;Ambulation Toilet Transfer Details (indicate cue type and reason): simulated to recliner                Extremity/Trunk Assessment              Vision       Perception     Praxis      Cognition Arousal: Alert Behavior During Therapy: WFL for tasks assessed/performed Overall Cognitive Status: Within Functional Limits for tasks assessed                                          Exercises      Shoulder Instructions       General Comments      Pertinent Vitals/ Pain       Pain Assessment Pain Assessment:  No/denies pain  Home Living                                          Prior Functioning/Environment              Frequency  Min 1X/week        Progress Toward Goals  OT Goals(current goals can now be found in the care plan section)  Progress towards OT goals: Progressing toward goals  Acute Rehab OT Goals Patient Stated Goal: get better OT Goal Formulation: With patient Time For Goal Achievement: 01/05/23 Potential to Achieve Goals: Good ADL Goals Pt Will Perform Grooming: with modified independence;standing Pt Will Perform Lower Body Bathing: with modified independence;sit  to/from stand Pt Will Perform Lower Body Dressing: with modified independence;sit to/from stand Pt Will Transfer to Toilet: with modified independence;ambulating;regular height toilet Pt Will Perform Toileting - Clothing Manipulation and hygiene: with modified independence;sit to/from stand Additional ADL Goal #1: Pt will independently verbalize at least two energy conservation strategies for selfcare tasks.  Plan      Co-evaluation                 AM-PAC OT "6 Clicks" Daily Activity     Outcome Measure   Help from another person eating meals?: None Help from another person taking care of personal grooming?: A Little Help from another person toileting, which includes using toliet, bedpan, or urinal?: A Little Help from another person bathing (including washing, rinsing, drying)?: A Little Help from another person to put on and taking off regular upper body clothing?: A Little Help from another person to put on and taking off regular lower body clothing?: A Little 6 Click Score: 19    End of Session Equipment Utilized During Treatment: Gait belt  OT Visit Diagnosis: Unsteadiness on feet (R26.81)   Activity Tolerance Patient tolerated treatment well   Patient Left in chair;with call bell/phone within reach;with chair alarm set;with family/visitor present   Nurse Communication Mobility status        Time: [phone] OT Time Calculation (min): 23 min  Charges: OT General Charges $OT Visit: 1 Visit OT Treatments $Self Care/Home Management : 8-22 mins $Therapeutic Activity: 8-22 mins  Alfonse Flavors, OTA Acute Rehabilitation Services  Office [phone]   Dewain Penning 12/23/2022, 12:52 PM

## 2022-12-23 NOTE — Progress Notes (Addendum)
[facility] Progress Note   Subjective: Seen in room. Net UF 2.4 liters in HD 12/22/2022. BP soft. Continue high dose midodrine, watch for bradycardia.C/O nausea. Notified RN  Objective Vitals:   12/23/22 0438 12/23/22 0726 12/23/22 0809 12/23/22 1240  BP: (!) 94/54 (!) 90/49  96/61  Pulse: (!) 55 (!) 55 60 62  Resp: 18 16 16    Temp: 98.2 F (36.8 C) 97.8 F (36.6 C)    TempSrc: Oral Oral    SpO2: 100% 100%    Weight:      Height:       Physical Exam General: Chronically ill appearing female in NAD Heart: S1,S2 No M/R/G Lungs: Decreased breath sounds LLL few bibasilar crackles Abdomen: NABS, non tender Extremities: No LE edema but edema upper thighs and hips Dialysis Access: R femoral TDC Drsg intact  Additional Objective Labs: Basic Metabolic Panel: Recent Labs  Lab 12/21/22 0840 12/22/22 0835 12/23/22 0747  NA 129* 128* 133*  K 3.8 3.7 3.7  CL 93* 93* 95*  CO2 21* 22 26  GLUCOSE 95 79 79  BUN 22* 28* 15  CREATININE 8.56* 9.90* 6.21*  CALCIUM 9.8 8.7* 9.3  PHOS 5.1* 6.0* 4.7*   Liver Function Tests: Recent Labs  Lab 12/18/22 0923 12/21/22 0840 12/22/22 0835 12/22/22 0848 12/23/22 0747  AST 15  --   --  26  --   ALT 14  --   --  12  --   ALKPHOS 156*  --   --  141*  --   BILITOT 1.0  --   --  0.5  --   PROT 6.3* 6.7  --  6.2*  --   ALBUMIN 2.9* 2.7* 2.4* 2.7* 2.4*   No results for input(s): "LIPASE", "AMYLASE" in the last 168 hours. CBC: Recent Labs  Lab 12/19/22 0443 12/20/22 0633 12/21/22 0840 12/22/22 0839 12/23/22 0747  WBC 7.2 7.9 8.1 8.1 6.6  HGB 7.8* 8.1* 9.5* 7.8* 8.4*  HCT 25.6* 26.9* 30.7* 24.8* 26.5*  MCV 91.1 92.4 91.1 90.2 89.5  PLT 219 241 233 201 181   Blood Culture    Component Value Date/Time   SDES EXPECTORATED SPUTUM 12/20/2022 1806   SDES EXPECTORATED SPUTUM 12/20/2022 1806   SPECREQUEST NONE 12/20/2022 1806   SPECREQUEST NONE Reflexed from [id number] 12/20/2022 1806   CULT FEW STAPHYLOCOCCUS  HAEMOLYTICUS 12/20/2022 1806   REPTSTATUS 12/20/2022 FINAL 12/20/2022 1806   REPTSTATUS 12/23/2022 FINAL 12/20/2022 1806    Cardiac Enzymes: Recent Labs  Lab 12/18/22 0923 12/18/22 1100  CKTOTAL 45 45   CBG: No results for input(s): "GLUCAP" in the last 168 hours. Iron Studies:  Recent Labs    12/23/22 1036  IRON 138  TIBC 167*  FERRITIN 1,933*   @lablastinr3 @ Studies/Results: US Abdomen Limited RUQ (LIVER/GB)  Result Date: 12/23/2022 CLINICAL DATA:  Cirrhosis. EXAM: ULTRASOUND ABDOMEN LIMITED RIGHT UPPER QUADRANT COMPARISON:  None Available. FINDINGS: Gallbladder: No gallstones or wall thickening visualized. No sonographic Murphy sign noted by sonographer. Common bile duct: Diameter: 5 mm Liver: Heterogeneous lobular appearance to the liver parenchyma. portal vein is patent on color Doppler imaging with normal direction of blood flow towards the liver. Other: None. IMPRESSION: Distended gallbladder.  No stones or ductal dilatation. Heterogeneous lobular liver. Electronically Signed   By: Karen Kays M.D.   On: 12/23/2022 11:38   Medications:  piperacillin-tazobactam (ZOSYN)  IV 2.25 g (12/23/22 0519)    allopurinol  100 mg Oral Daily   amiodarone  100 mg Oral  Daily   apixaban  2.5 mg Oral BID   Chlorhexidine Gluconate Cloth  6 each Topical Q0600   dorzolamide-timolol  1 drop Left Eye BID   escitalopram  20 mg Oral Daily   famotidine  10 mg Oral Daily   fluticasone furoate-vilanterol  1 puff Inhalation Daily   metoCLOPramide  10 mg Oral TID AC   midodrine  15 mg Oral TID WC   mometasone-formoterol  2 puff Inhalation BID   scopolamine  1 patch Transdermal Q72H   sucroferric oxyhydroxide  500 mg Oral TID WC     OP HD: TTS G-O  4h  400/700  48.9kg   2/2 bath  R fem TDC    - Heparin 2000 units IV   - last HD 11/12, post wt 49.3kg - rocaltrol 2.75 mcg PO TIW - venofer 50 mg IV weekly - NO ESA D/T new Dx lung cancer   Assessment/Plan Acute hypoxic respiratory  failure secondary to L HCAP S/P bronch per pulmonary 11/16/2022.  L Pleural effusion-was being followed by pulmonary. Cytology negative. PCCM signed off.  Malignant lung nodule of LUL-W/U per pulmonary. Oncology consulted.  ESRD-HD TTS  Next HD 12/24/2022 HTN/volume-HD yesterday Net UF 2.4 liters no post wt done. UF as tolerated. Continue midodrine 15 mg 3 times daily. Watch for bradycardia on high dose midodrine.  COPD- on bronchodilators /52.5-pack-year smoking history Anemia of ESRD-Hb 8.4 follow-up trend transfuse Hgb below 7, no ESA 2/2 lung cancer diagnosis. High [place] DC venofer on DC.  Secondary hyperparathyroidism -calcium 9.5 corrected Ca 10.5 decrease VDRA dose. Po4 at [facility]  Low albumin-start protein supplements.   Disposition: Hopefully discharge post HD tomorrow. Will F/U with oncology as OP.    Dainel Arcidiacono H. Nadalyn Deringer NP-C 12/23/2022, 1:12 PM  [employer] [phone]

## 2022-12-23 NOTE — Consult Note (Signed)
[facility]  [phone]   HEMATOLOGY/ONCOLOGY IN-PATIENT CONSULTATION NOTE   PATIENT NAME: Tina Watkins   MR#: [id number] DOB: Apr 24, 1965 CSN#: [id number]   DATE OF SERVICE: 12/23/2022  Requesting Physician: Reymundo Poll, MD  Patient Care Team: Ivonne Andrew, NP as PCP - General (Pulmonary Disease) O'Neal, Ronnald Ramp, MD as PCP - Cardiology (Cardiology) Bensimhon, Bevelyn Buckles, MD as PCP - Advanced Heart Failure (Cardiology)  REASON FOR CONSULTATION:  Newly diagnosed lung malignancy, type unspecified.  Multiple tiny bilateral lung nodules and mediastinal lymphadenopathy.  She also has pancreatic tail mass which is partially cystic.  HISTORY OF PRESENT ILLNESS  Tina Watkins is a 57 y.o.  lady with multiple medical comorbidities including kidney transplant, currently ESRD on HD, congestive heart failure, atrial fibrillation/flutter, COPD, depression, chronic gout, chronic pain, anemia of chronic disease, gastroparesis, midodrine dependent hypotension, history of strokes, suspected mesenteric ischemia, was recently diagnosed with malignant lung nodule on 11/16/2022 via bronchoscopy.  She was admitted to the hospital on 12/18/2022 after she presented with shortness of breath, cough with yellow sputum, chills, myalgias and diarrhea.  She was noted to be in acute hypoxic respiratory failure, related to postobstructive pneumonia.  We were consulted for additional recommendations regarding recently found malignant left upper lobe lung nodule.  ONCOLOGY HISTORY:  Patient was seen by pulmonology after she was diagnosed with COVID-19 infection in the ED on 09/14/2022.  Because of her chronic smoking history with at least 51-pack-year smoking, a low-dose CT of the chest was obtained for lung cancer screening.  On 09/28/2022, CT chest showed dominant new solid peripheral left upper lobe lung nodule measuring 21 mm.  Several new clustered solid pulmonary  nodules throughout left upper lobe, largest measuring 5 mm.  Numerous additional tiny 2 to 3 mm solid pulmonary nodules scattered in both lungs, new finding.  Findings concerning for primary bronchogenic malignancy.  Lung RADS 4B, suspicious.  Also noted was cystic pancreatic tail lesions, largest measuring 2.6 cm posteriorly, indeterminate.  MRI abdomen recommended for further evaluation.  Because of CT chest findings, PET/CT was obtained on 10/19/2022 and it showed 2.6 cm subpleural nodule in the left upper lobe with SUV max of 4.9.  Primary bronchogenic neoplasm could not be excluded.  Tissue sampling was recommended.  Also noted was 15 mm short axis prevascular lymph node with SUV of 2.4, new compared to 2023.  Indeterminate.  Small left pleural effusion was also noted.  MRI of the abdomen on 11/11/2022: Evaluation of the pancreatic tail was limited by metallic susceptibility artifact from adjacent surgical clips as well as iron burden in the adjacent stomach parenchyma.  There was a complex, multiseptated, internally heterogeneous cystic lesion arising from the tip of the pancreatic tail measuring 4.4 x 2.3 x 3.2 cm.  Contrast enhancement could not be confirmed due to technical limitations.  Appearance was concerning for a partially cystic malignancy with a significant solid component.  Tissue sampling was recommended.  For future, recommended multiphasic contrast-enhanced CT.  Severe hepatic and splenic iron deposition was noted.  Coarse, nodular contour of the liver, consistent with cirrhosis.  Status post bilateral native nephrectomy.  Right lower quadrant renal transplant graft was noted.  Diffusely heterogeneous bone marrow, reflecting marrow reconversion renal osteodystrophy.  On 11/11/2022, she had multidetector CT imaging of the chest using thin slice collimation for electromagnetic bronchoscopy planning purposes.  It showed similar findings compared to prior imaging.  On 11/16/2022, Dr. Delton Coombes  performed robotic assisted navigational bronchoscopy  and biopsies.  Left upper lobe lung nodule needle aspiration biopsy showed presence of malignant cells.  Not further characterized.  Endobronchial brushings and brushings from left upper lobe lung nodule showed no malignant cells.  She was admitted to the hospital on 12/18/2022 after she presented with shortness of breath, cough with yellow sputum, chills, myalgias and diarrhea.  She was noted to be in acute hypoxic respiratory failure, related to postobstructive pneumonia.  Underwent left-sided thoracentesis on 12/20/2022.  Pleural fluid cytology showed no evidence of malignant cells.   INTERVAL HISTORY:  Patient seen and evaluated.  Patient has been on antibiotics for pneumonia. Feeling subjectively better. Able to tolerate oral intake well.   MEDICAL HISTORY Past Medical History:  Diagnosis Date   Anxiety    Bacteremia due to Gram-negative bacteria 05/23/2011   Blind    right eye   Blind right eye    CHF (congestive heart failure) (HCC)    Chronic lower back pain    Complication of anesthesia    "woke up during OR; I have an extremely high tolerance" (12/11/2011) 1 procedure was graft; the other procedures were procedures that are typically done with sedation.   DDD (degenerative disc disease), cervical    Depression    Dysrhythmia    "tachycardia" (12/11/2011) new onset afib 10/15/14 EKG   E coli bacteremia 06/18/2011   Elevated LDL cholesterol level 12/2018   ESRD (end stage renal disease) (HCC) 06/12/2011   Tues-Thurs-Sat dialysis   Fibromyalgia    Gastroparesis    GERD (gastroesophageal reflux disease)    Glaucoma    right eye   Gout    H/O carpal tunnel syndrome    Headache 10/2019   no current problem per patient on 11/12/22   Headache(784.0)    "not often anymore" (12/11/2011)   Herpes genitalia 1994   History of blood transfusion    "more than a few times" (12/11/2011)   History of COVID-19, most recently 09/2022  05/09/2019   History of deep vein thrombosis (DVT) of right lower extremity 11/20/2017   History of stomach ulcers    Hypotension    Iron deficiency anemia    New onset a-fib (HCC)    10/15/14 EKG   Osteopenia    Peripheral neuropathy 11/2018   Pneumonia    x several but years ago   Pressure ulcer of sacral region, stage 1 07/2019   Seizures (HCC) 1994   "post transplant; only have had that one" (12/11/2011)   Spinal stenosis in cervical region    Stroke [facility])     left basal ganglia lacunar infarct; Right frontal lobe lacunar infarct.   Stroke [facility]) ~ 1999; 2001   "briefly lost my vision; lost my right eye" (12/11/2011)   Vitamin D deficiency 12/2018     SURGICAL HISTORY Past Surgical History:  Procedure Laterality Date   ANTERIOR CERVICAL DECOMP/DISCECTOMY FUSION N/A 01/08/2015   Procedure: Anterior Cervical Three-Four/Four-Five Decompression/Diskectomy/Fusion;  Surgeon: Hilda Lias, MD;  Location: [facility] NEURO ORS;  Service: Neurosurgery;  Laterality: N/A;  C3-4 C4-5 Anterior cervical decompression/diskectomy/fusion   APPENDECTOMY  ~ 2004   BIOPSY  09/12/2020   Procedure: BIOPSY;  Surgeon: Rachael Fee, MD;  Location: [facility] ENDOSCOPY;  Service: Endoscopy;;   BRONCHIAL BIOPSY  11/16/2022   Procedure: BRONCHIAL BIOPSIES;  Surgeon: Leslye Peer, MD;  Location: [facility] ENDOSCOPY;  Service: Pulmonary;;   BRONCHIAL BRUSHINGS  11/16/2022   Procedure: BRONCHIAL BRUSHINGS;  Surgeon: Leslye Peer, MD;  Location[facility] ENDOSCOPY;  Service:  Pulmonary;;   BRONCHIAL NEEDLE ASPIRATION BIOPSY  11/16/2022   Procedure: BRONCHIAL NEEDLE ASPIRATION BIOPSIES;  Surgeon: Leslye Peer, MD;  Location: [facility] ENDOSCOPY;  Service: Pulmonary;;   CATARACT EXTRACTION     right eye   COLONOSCOPY     COLONOSCOPY WITH PROPOFOL N/A 09/12/2020   Procedure: COLONOSCOPY WITH PROPOFOL;  Surgeon: Rachael Fee, MD;  Location: [facility] ENDOSCOPY;  Service: Endoscopy;  Laterality: N/A;   ENUCLEATION  2001   "right"    ESOPHAGOGASTRODUODENOSCOPY (EGD) WITH PROPOFOL N/A 04/21/2012   Procedure: ESOPHAGOGASTRODUODENOSCOPY (EGD) WITH PROPOFOL;  Surgeon: Rachael Fee, MD;  Location: [facility] ENDOSCOPY;  Service: Endoscopy;  Laterality: N/A;   ESOPHAGOGASTRODUODENOSCOPY (EGD) WITH PROPOFOL N/A 09/12/2020   Procedure: ESOPHAGOGASTRODUODENOSCOPY (EGD) WITH PROPOFOL;  Surgeon: Rachael Fee, MD;  Location: [facility] ENDOSCOPY;  Service: Endoscopy;  Laterality: N/A;   FIDUCIAL MARKER PLACEMENT  11/16/2022   Procedure: FIDUCIAL MARKER PLACEMENT;  Surgeon: Leslye Peer, MD;  Location: [facility] ENDOSCOPY;  Service: Pulmonary;;   HEMOSTASIS CONTROL  11/16/2022   Procedure: HEMOSTASIS CONTROL;  Surgeon: Leslye Peer, MD;  Location: [facility] ENDOSCOPY;  Service: Pulmonary;;   INSERTION OF DIALYSIS CATHETER  1988   "AV graft LUA & LFA; LUA worked for 1 day; LFA never worked"   IR FLUORO GUIDE CV LINE RIGHT  07/11/2019   IR FLUORO GUIDE CV LINE RIGHT  10/20/2019   IR FLUORO GUIDE CV LINE RIGHT  05/31/2020   IR PTA VENOUS EXCEPT DIALYSIS CIRCUIT  05/31/2020   IR RADIOLOGY PERIPHERAL GUIDED IV START  07/11/2019   IR US GUIDE VASC ACCESS RIGHT  07/11/2019   IR US GUIDE VASC ACCESS RIGHT  07/11/2019   IR US GUIDE VASC ACCESS RIGHT  07/11/2019   IR VENOCAVAGRAM IVC  05/31/2020   KIDNEY TRANSPLANT  1994; 1999; 2005   "right"   MULTIPLE TOOTH EXTRACTIONS     POLYPECTOMY  09/12/2020   Procedure: POLYPECTOMY;  Surgeon: Rachael Fee, MD;  Location: [facility] ENDOSCOPY;  Service: Endoscopy;;   RIGHT HEART CATH N/A 03/23/2019   Procedure: RIGHT HEART CATH;  Surgeon: Dolores Patty, MD;  Location: [facility] INVASIVE CV LAB;  Service: Cardiovascular;  Laterality: N/A;   TONSILLECTOMY     TOTAL NEPHRECTOMY  1988?; 1994; 2005   VIDEO BRONCHOSCOPY WITH RADIAL ENDOBRONCHIAL ULTRASOUND  11/16/2022   Procedure: VIDEO BRONCHOSCOPY WITH RADIAL ENDOBRONCHIAL ULTRASOUND;  Surgeon: Leslye Peer, MD;  Location: [facility] ENDOSCOPY;  Service: Pulmonary;;     ALLERGIES  Allergies   Allergen Reactions   Levofloxacin Itching and Rash   Tape Other (See Comments)    "Certain surgical tapes peel off my skin"    FAMILY HISTORY  Family History  Problem Relation Age of Onset   Glaucoma Mother    Pancreatic cancer Father    Multiple sclerosis Brother    Diabetes Maternal Uncle    Hypertension Maternal Grandmother    Breast cancer Neg Hx      SOCIAL HISTORY   Social History   Socioeconomic History   Marital status: Single    Spouse name: Not on file   Number of children: 0   Years of education: some college   Highest education level: Some college, no degree  Occupational History   Occupation: disabled    Associate Professor: UNEMPLOYED  Tobacco Use   Smoking status: Every Day    Current packs/day: 1.50    Average packs/day: 1.5 packs/day for 35.0 years (52.5 ttl pk-yrs)    Types: Cigarettes  Passive exposure: Never   Smokeless tobacco: Never   Tobacco comments:    Pack of cigarettes lasts 2 days 09/18/2020    Patient now only smokes 5 cigarettes per day as of 11/12/22  Vaping Use   Vaping status: Never Used  Substance and Sexual Activity   Alcohol use: Not Currently    Comment: rarely   Drug use: Yes    Frequency: 1.0 times per week    Types: Marijuana    Comment: last use was 11/12/22 - informed to refrain 1-2 days prior procedure   Sexual activity: Not Currently    Partners: Male    Birth control/protection: None  Other Topics Concern   Not on file  Social History Narrative   Right-handed.   Four cups caffeine per day.   Lives alone.   Social Determinants of Health   Financial Resource Strain: Low Risk  (06/25/2022)   Overall Financial Resource Strain (CARDIA)    Difficulty of Paying Living Expenses: Not hard at all  Food Insecurity: No Food Insecurity (12/18/2022)   Hunger Vital Sign    Worried About Running Out of Food in the Last Year: Never true    Ran Out of Food in the Last Year: Never true  Transportation Needs: No Transportation Needs  (12/18/2022)   PRAPARE - Administrator, Civil Service (Medical): No    Lack of Transportation (Non-Medical): No  Physical Activity: Inactive (06/25/2022)   Exercise Vital Sign    Days of Exercise per Week: 0 days    Minutes of Exercise per Session: 0 min  Stress: Stress Concern Present (06/25/2022)   [employer] of Occupational Health - Occupational Stress Questionnaire    Feeling of Stress : Very much  Social Connections: Socially Isolated (06/25/2022)   Social Connection and Isolation Panel [NHANES]    Frequency of Communication with Friends and Family: More than three times a week    Frequency of Social Gatherings with Friends and Family: Once a week    Attends Religious Services: Never    Database administrator or Organizations: No    Attends Banker Meetings: Never    Marital Status: Never married  Intimate Partner Violence: Not At Risk (12/18/2022)   Humiliation, Afraid, Rape, and Kick questionnaire    Fear of Current or Ex-Partner: No    Emotionally Abused: No    Physically Abused: No    Sexually Abused: No    CURRENT MEDICATIONS   Current Outpatient Medications  Medication Instructions   alendronate (FOSAMAX) 70 mg, Weekly   allopurinol (ZYLOPRIM) 100 mg, Oral, Daily   amiodarone (PACERONE) 100 mg, Oral, Daily   amoxicillin-clavulanate (AUGMENTIN) 875-125 MG tablet 1 tablet, Oral, 2 times daily   apixaban (ELIQUIS) 2.5 mg, Oral, 2 times daily, Okay to restart this medication on 11/18/2022   Colchicine 0.6 mg, Oral, Daily PRN   cyclobenzaprine (FLEXERIL) 5 mg, Oral, At bedtime PRN   diclofenac Sodium (VOLTAREN) 4 g, Topical, As needed   diphenhydrAMINE (BENADRYL) 25 mg, Oral, Every 8 hours PRN   dorzolamide-timolol (COSOPT) 2-0.5 % ophthalmic solution 1 drop, Left Eye, 2 times daily   escitalopram (LEXAPRO) 20 mg, Oral, Daily   famotidine (PEPCID) 20 mg, Oral, 2 times daily   fluticasone-salmeterol (ADVAIR HFA) 115-21 MCG/ACT inhaler 2  puffs, Inhalation, 2 times daily   gabapentin (NEURONTIN) 100 mg, Oral, 3 times daily PRN   lidocaine (LIDODERM) 5 % 1 patch, Transdermal, Daily PRN   metoCLOPramide (REGLAN) 10 mg, Oral,  3 times daily before meals   midodrine (PROAMATINE) 15 mg, Oral, 3 times daily, NEEDS FOLLOW UP APPOINTMENT FOR ANYMORE REFILLS   omeprazole (PRILOSEC) 40 mg, Oral, Daily   ondansetron (ZOFRAN-ODT) 8 mg, Oral, As needed   oxyCODONE-acetaminophen (PERCOCET) 10-325 MG tablet 1 tablet, Oral, 4 times daily PRN   Velphoro 500 mg, Oral, 3 times daily   zolpidem (AMBIEN) 10 mg, Oral, At bedtime PRN     REVIEW OF SYSTEMS   Review of Systems - Oncology  All other pertinent review of systems is negative except as mentioned above in HPI  PHYSICAL EXAMINATION  ECOG PERFORMANCE STATUS: 2 - Symptomatic, <50% confined to bed  Vitals:   12/23/22 0726 12/23/22 0809  BP: (!) 90/49   Pulse: (!) 55 60  Resp: 16 16  Temp: 97.8 F (36.6 C)   SpO2: 100%    Filed Weights   12/18/22 1951 12/19/22 0000 12/22/22 0814  Weight: 108 lb 7.5 oz (49.2 kg) 108 lb 14.5 oz (49.4 kg) 110 lb 7.2 oz (50.1 kg)    Physical Exam Constitutional:      General: She is not in acute distress.    Appearance: Normal appearance.  HENT:     Head: Normocephalic and atraumatic.  Eyes:     General: No scleral icterus.    Conjunctiva/sclera: Conjunctivae normal.  Cardiovascular:     Rate and Rhythm: Normal rate and regular rhythm.     Heart sounds: Normal heart sounds.  Pulmonary:     Effort: Pulmonary effort is normal.     Comments: Slightly decreased breath sounds in left base Abdominal:     General: There is no distension.  Musculoskeletal:     Right lower leg: No edema.     Left lower leg: No edema.  Neurological:     General: No focal deficit present.     Mental Status: She is alert and oriented to person, place, and time.  Psychiatric:        Mood and Affect: Mood normal.        Behavior: Behavior normal.        Thought  Content: Thought content normal.      LABORATORY DATA:   I have reviewed the data as listed  Results for orders placed or performed during the hospital encounter of 12/18/22 (from the past 24 hour(s))  APTT   Collection Time: 12/22/22  3:17 PM  Result Value Ref Range   aPTT 51 (H) 24 - 36 seconds  Protime-INR   Collection Time: 12/22/22  3:17 PM  Result Value Ref Range   Prothrombin Time 16.2 (H) 11.4 - 15.2 seconds   INR 1.3 (H) 0.8 - 1.2  Renal function panel   Collection Time: 12/23/22  7:47 AM  Result Value Ref Range   Sodium 133 (L) 135 - 145 mmol/L   Potassium 3.7 3.5 - 5.1 mmol/L   Chloride 95 (L) 98 - 111 mmol/L   CO2 26 22 - 32 mmol/L   Glucose, Bld 79 70 - 99 mg/dL   BUN 15 6 - 20 mg/dL   Creatinine, Ser 6.96 (H) 0.44 - 1.00 mg/dL   Calcium 9.3 8.9 - 29.5 mg/dL   Phosphorus 4.7 (H) 2.5 - 4.6 mg/dL   Albumin 2.4 (L) 3.5 - 5.0 g/dL   GFR, Estimated 7 (L) >60 mL/min   Anion gap 12 5 - 15  CBC   Collection Time: 12/23/22  7:47 AM  Result Value Ref Range   WBC 6.6  4.0 - 10.5 K/uL   RBC 2.96 (L) 3.87 - 5.11 MIL/uL   Hemoglobin 8.4 (L) 12.0 - 15.0 g/dL   HCT 16.1 (L) 09.6 - 04.5 %   MCV 89.5 80.0 - 100.0 fL   MCH 28.4 26.0 - 34.0 pg   MCHC 31.7 30.0 - 36.0 g/dL   RDW 40.9 (H) 81.1 - 91.4 %   Platelets 181 150 - 400 K/uL   nRBC 0.0 0.0 - 0.2 %   *Note: Due to a large number of results and/or encounters for the requested time period, some results have not been displayed. A complete set of results can be found in Results Review.      RADIOGRAPHIC STUDIES:  I have personally reviewed the radiological images as listed and agree with the findings in the report.  US THORACENTESIS ASP PLEURAL SPACE W/IMG GUIDE  Result Date: 12/20/2022 INDICATION: Recent diagnosis of left upper lobe malignancy with left pleural effusion. Request for diagnostic and therapeutic thoracentesis. EXAM: ULTRASOUND GUIDED LEFT THORACENTESIS MEDICATIONS: 1% lidocaine 10 mL COMPLICATIONS:  None immediate. PROCEDURE: An ultrasound guided thoracentesis was thoroughly discussed with the patient and questions answered. The benefits, risks, alternatives and complications were also discussed. The patient understands and wishes to proceed with the procedure. Written consent was obtained. Ultrasound was performed to localize and mark an adequate pocket of fluid in the left chest. The area was then prepped and draped in the normal sterile fashion. 1% Lidocaine was used for local anesthesia. Under ultrasound guidance a 6 Fr Safe-T-Centesis catheter was introduced. Thoracentesis was performed. The catheter was removed and a dressing applied. FINDINGS: A total of approximately 470 mL of hazy amber fluid was removed. Samples were sent to the laboratory as requested by the clinical team. IMPRESSION: Successful ultrasound guided left thoracentesis yielding 470 mL of pleural fluid. Procedure performed by: Corrin Parker, PA-C Electronically Signed   By: Richarda Overlie M.D.   On: 12/20/2022 10:17   DG Chest 1 View  Result Date: 12/20/2022 CLINICAL DATA:  Thoracentesis EXAM: CHEST  1 VIEW COMPARISON:  12/18/2022 FINDINGS: Stable cardiomediastinal contours. SVC stent. Persistent left upper lobe collapse. Trace left pleural effusion, decreased. Increasing interstitial opacities throughout both lungs. No pneumothorax. IMPRESSION: 1. Trace left pleural effusion, decreased. No pneumothorax. 2. Increasing interstitial opacities throughout both lungs, which may reflect edema or atypical infection. 3. Persistent left upper lobe collapse. Electronically Signed   By: Duanne Guess D.O.   On: 12/20/2022 09:48   CT Angio Chest PE W and/or Wo Contrast  Result Date: 12/18/2022 CLINICAL DATA:  Shortness of breath EXAM: CT ANGIOGRAPHY CHEST WITH CONTRAST TECHNIQUE: Multidetector CT imaging of the chest was performed using the standard protocol during bolus administration of intravenous contrast. Multiplanar CT image  reconstructions and MIPs were obtained to evaluate the vascular anatomy. RADIATION DOSE REDUCTION: This exam was performed according to the departmental dose-optimization program which includes automated exposure control, adjustment of the mA and/or kV according to patient size and/or use of iterative reconstruction technique. CONTRAST:  60mL OMNIPAQUE IOHEXOL 350 MG/ML SOLN COMPARISON:  Chest x-ray today.  Chest CT 11/11/2022 FINDINGS: Cardiovascular: No filling defects in the pulmonary arteries to suggest pulmonary emboli. Heart is normal size. Aorta is normal caliber. Scattered aortic atherosclerosis. There appears to be stenosis in the left brachiocephalic vein and upper SVC with extensive collateral venous channels in the left chest. Aortic atherosclerosis. Mediastinum/Nodes: Right paratracheal adenopathy with lymph node measuring up to 1.9 cm in short axis diameter on image 27, stable  since prior study. No visible hilar or axillary adenopathy. Trachea and esophagus are unremarkable. Thyroid unremarkable. Lungs/Pleura: Complete consolidation of the left upper lobe, new since prior study. Previously described left upper lobe mass cannot be visualized due to complete consolidation. Moderate left pleural effusion. Dependent opacities posteriorly right lower lobe, stable and likely reflecting atelectasis or scarring. Numerous small nodules throughout the right lung, appear to be increasing in number since prior study. Upper Abdomen: No acute findings Musculoskeletal: No acute bony abnormality. Review of the MIP images confirms the above findings. IMPRESSION: No evidence of pulmonary embolus. Complete consolidation of the left upper lobe. Moderate left pleural effusion. Numerous small nodules throughout the right lung appear to be increasing in number since prior study. Extensive collateral venous channels in the left chest wall and mediastinum, likely related to central venous stenosis in the left brachiocephalic  vein and upper SVC. Stable mediastinal adenopathy. Aortic Atherosclerosis (ICD10-I70.0). Electronically Signed   By: Charlett Nose M.D.   On: 12/18/2022 20:22   DG Chest 2 View  Result Date: 12/18/2022 CLINICAL DATA:  Shortness of breath. EXAM: CHEST - 2 VIEW COMPARISON:  11/16/2022. FINDINGS: Since the prior study, there is new homogeneous opacification of left upper hemithorax. There also heterogeneous opacities in the left lower hemithorax. There are increased interstitial markings in the right lung which may represent mild underlying pulmonary edema. Right lung is otherwise clear. No dense consolidation or lung collapse. Bilateral costophrenic angles are clear. Stable cardio-mediastinal silhouette. Redemonstration of vascular stent in the superior vena cava. No acute osseous abnormalities. Partially seen lower cervical spinal fixation hardware. The soft tissues are within normal limits. IMPRESSION: *Findings favor near complete left upper lobe lung collapse. Contrast-enhanced CT scan chest and/or bronchoscopy is recommended. *Increased interstitial markings in the right lung may represent mild underlying pulmonary edema. Correlate clinically. *No pleural effusion. Electronically Signed   By: Jules Schick M.D.   On: 12/18/2022 12:02    ASSESSMENT & PLAN:   57 y.o. lady with multiple medical comorbidities including kidney transplant, currently ESRD on HD, congestive heart failure, atrial fibrillation/flutter, COPD, depression, chronic gout, chronic pain, anemia of chronic disease, gastroparesis, midodrine dependent hypotension, history of strokes, suspected mesenteric ischemia, was recently diagnosed with malignant lung nodule on 11/16/2022 via bronchoscopy.  Please review oncology history above for additional details and timeline of events.  The left upper lobe lung nodule biopsy showed malignant cells, it could not be characterized further.  To help with treatment planning, she will need a repeat  biopsy of the left upper lobe lung nodule.    Discussed with Dr. Delton Coombes via secure chat.  Plan is to proceed with repeat CT, once her pneumonia resolves, followed by transthoracic biopsy with IR.  Discussed findings so far with the patient, her uncle by the bedside and with her husband over the phone. Explained the need for repeat biopsy and they verbalized understanding.   Several subcentimeter lung nodules were noted on CT scan . They are below the threshold by size, for determining FDG activity and hence were not apparent on PET scan.   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  This will need additional evaluation.  Please refer to GI for further workup in the outpatient setting (She follows up with [facility]). Recommend checking CEA and CA 19-9 with next set of labs.  Further treatment decisions will be  made, once we have repeat biopsy results. If no other evidence of disease is noted, she can be considered for definitive treatments with either surgical resection or SBRT with Rad Onc. However, we have to monitor her subcentimeter lung nodules as well. Systemic therapy would be very challenging given her multiple medical co-morbidities, midodrine dependent hypotension etc.   Rest of care as per primary team and other specialties.  Thanks for the opportunity to participate in the care of this patient. Please contact me if there are any questions.  I will plan to see her in clinic pending repeat biopsy of left upper lobe lung nodule.  We will also follow-up on GI workup for pancreatic lesion.  Meryl Crutch, MD Medical Oncology and Hematology 12/23/2022 9:23 AM    This document was completed utilizing speech recognition software. Grammatical errors, random word insertions, pronoun errors, and incomplete sentences are an occasional consequence of  this system due to software limitations, ambient noise, and hardware issues. Any formal questions or concerns about the content, text or information contained within the body of this dictation should be directly addressed to the provider for clarification.

## 2022-12-23 NOTE — Progress Notes (Signed)
PT Cancellation Note  Patient Details Name: Tina Watkins MRN: [id number] DOB: 25-Aug-1965   Cancelled Treatment:    Reason Eval/Treat Not Completed: (P) Fatigue/lethargy limiting ability to participate (Pt defers, requesting to sleep "for at least another half hour". Dinner had arrived to room but pt defers to eat at this time. NT notified to check in/assist her up to chair later if pt awakens.) Will continue efforts next date per PT plan of care as schedule permits.   Britt Theard M Reather Steller 12/23/2022, 6:18 PM

## 2022-12-23 NOTE — Progress Notes (Signed)
   NAME:  Tina Watkins, MRN:  [id number], DOB:  Jan 26, 1966, LOS: 5 ADMISSION DATE:  12/18/2022, CONSULTATION DATE:  12/18/22 REFERRING MD:  Tina Watkins CHIEF COMPLAINT:  Aches, generalized weakness   Labs/imaging personally reviewed:  CT chest 11/15 > dense left lung consolidation, moderate left effusion L pleural fluid cytology >> no malignancy  Recommendations:  - Agree with completion abx course for post-bronchoscopy PNA - L pleural fluid cytology negative, so nothing to support stage IV disease at this time - I messaged Dr Tina Watkins with Oncology to let him know that she would not be at OV today. Also mentioned that she will need TTNA in IR after PNA clears to get a more specific tissue diagnosis. She will need a repeat CT chest to localize the nodule after PNA improves > recommend about 3-4 weeks from now.  - Please call if we can assist further   Tina Pupa, MD, PhD 12/23/2022, 9:51 AM [facility] [phone] or if no answer before 7:00PM call [phone] For any issues after 7:00PM please call eLink [phone]

## 2022-12-24 ENCOUNTER — Other Ambulatory Visit (HOSPITAL_COMMUNITY): Payer: Self-pay

## 2022-12-24 ENCOUNTER — Encounter (HOSPITAL_COMMUNITY): Payer: Self-pay | Admitting: *Deleted

## 2022-12-24 ENCOUNTER — Encounter (HOSPITAL_COMMUNITY): Payer: Self-pay | Admitting: Internal Medicine

## 2022-12-24 DIAGNOSIS — E1169 Type 2 diabetes mellitus with other specified complication: Secondary | ICD-10-CM

## 2022-12-24 DIAGNOSIS — C3412 Malignant neoplasm of upper lobe, left bronchus or lung: Secondary | ICD-10-CM | POA: Diagnosis not present

## 2022-12-24 DIAGNOSIS — M869 Osteomyelitis, unspecified: Secondary | ICD-10-CM

## 2022-12-24 HISTORY — DX: Type 2 diabetes mellitus with other specified complication: M86.9

## 2022-12-24 LAB — CBC
HCT: 24 % — ABNORMAL LOW (ref 36.0–46.0)
Hemoglobin: 7.4 g/dL — ABNORMAL LOW (ref 12.0–15.0)
MCH: 27.5 pg (ref 26.0–34.0)
MCHC: 30.8 g/dL (ref 30.0–36.0)
MCV: 89.2 fL (ref 80.0–100.0)
Platelets: 183 10*3/uL (ref 150–400)
RBC: 2.69 MIL/uL — ABNORMAL LOW (ref 3.87–5.11)
RDW: 20.1 % — ABNORMAL HIGH (ref 11.5–15.5)
WBC: 7.8 10*3/uL (ref 4.0–10.5)
nRBC: 0 % (ref 0.0–0.2)

## 2022-12-24 LAB — RENAL FUNCTION PANEL
Albumin: 2.5 g/dL — ABNORMAL LOW (ref 3.5–5.0)
Anion gap: 15 (ref 5–15)
BUN: 22 mg/dL — ABNORMAL HIGH (ref 6–20)
CO2: 24 mmol/L (ref 22–32)
Calcium: 9 mg/dL (ref 8.9–10.3)
Chloride: 93 mmol/L — ABNORMAL LOW (ref 98–111)
Creatinine, Ser: 8.21 mg/dL — ABNORMAL HIGH (ref 0.44–1.00)
GFR, Estimated: 5 mL/min — ABNORMAL LOW (ref >60)
Glucose, Bld: 81 mg/dL (ref 70–99)
Phosphorus: 5.5 mg/dL — ABNORMAL HIGH (ref 2.5–4.6)
Potassium: 3.3 mmol/L — ABNORMAL LOW (ref 3.5–5.1)
Sodium: 132 mmol/L — ABNORMAL LOW (ref 135–145)

## 2022-12-24 LAB — MITOCHONDRIAL ANTIBODIES: Mitochondrial M2 Ab, IgG: 58.7 U — ABNORMAL HIGH (ref 0.0–20.0)

## 2022-12-24 LAB — HCV INTERPRETATION

## 2022-12-24 LAB — ANA W/REFLEX IF POSITIVE: Anti Nuclear Antibody (ANA): NEGATIVE

## 2022-12-24 LAB — HCV AB W REFLEX TO QUANT PCR: HCV Ab: NONREACTIVE

## 2022-12-24 MED ORDER — AMOXICILLIN-POT CLAVULANATE 500-125 MG PO TABS
1.0000 | ORAL_TABLET | Freq: Once | ORAL | Status: AC
  Administered 2022-12-24: 1 via ORAL
  Filled 2022-12-24: qty 1

## 2022-12-24 MED ORDER — ALBUMIN HUMAN 25 % IV SOLN
12.5000 g | Freq: Once | INTRAVENOUS | Status: AC
  Administered 2022-12-24: 12.5 g via INTRAVENOUS
  Filled 2022-12-24: qty 100

## 2022-12-24 MED ORDER — HEPARIN SODIUM (PORCINE) 1000 UNIT/ML DIALYSIS
2000.0000 [IU] | Freq: Once | INTRAMUSCULAR | Status: AC
  Administered 2022-12-24: 2000 [IU] via INTRAVENOUS_CENTRAL
  Filled 2022-12-24 (×2): qty 2

## 2022-12-24 MED ORDER — ALTEPLASE 2 MG IJ SOLR: 2.0000 mg | Freq: Once | INTRAMUSCULAR | Status: DC | PRN

## 2022-12-24 MED ORDER — POTASSIUM CHLORIDE CRYS ER 20 MEQ PO TBCR
20.0000 meq | EXTENDED_RELEASE_TABLET | Freq: Once | ORAL | Status: AC
  Administered 2022-12-24: 20 meq via ORAL
  Filled 2022-12-24 (×2): qty 1

## 2022-12-24 MED ORDER — HYDROCOD POLI-CHLORPHE POLI ER 10-8 MG/5ML PO SUER
5.0000 mL | Freq: Once | ORAL | Status: AC
  Administered 2022-12-24: 5 mL via ORAL
  Filled 2022-12-24: qty 5

## 2022-12-24 MED ORDER — ANTICOAGULANT SODIUM CITRATE 4% (200MG/5ML) IV SOLN: 5.0000 mL | Status: DC | PRN

## 2022-12-24 MED ORDER — HEPARIN SODIUM (PORCINE) 1000 UNIT/ML DIALYSIS
1000.0000 [IU] | INTRAMUSCULAR | Status: DC | PRN
  Administered 2022-12-24: 3800 [IU]
  Filled 2022-12-24 (×3): qty 1

## 2022-12-24 MED ORDER — ALLOPURINOL 100 MG PO TABS
50.0000 mg | ORAL_TABLET | ORAL | 3 refills | Status: DC
  Filled 2022-12-24: qty 12, 84d supply, fill #0

## 2022-12-24 NOTE — Progress Notes (Signed)
[name] to be D/C'd  per MD order.  Discussed with the patient and all questions fully answered.  VSS, Skin clean, dry and intact without evidence of skin break down, no evidence of skin tears noted.  IV catheter discontinued intact. Site without signs and symptoms of complications. Dressing and pressure applied.  An After Visit Summary was printed and given to the patient. Patient received medication from [facility] pharmacy.  D/c education completed with patient/family including follow up instructions, medication list, d/c activities limitations if indicated, with other d/c instructions as indicated by MD - patient able to verbalize understanding, all questions fully answered.   Patient instructed to return to ED, call 911, or call MD for any changes in condition.   Patient to be escorted via WC, and D/C home via private auto.

## 2022-12-24 NOTE — Progress Notes (Signed)
PT Cancellation Note  Patient Details Name: Tina Watkins MRN: [id number] DOB: [birth year]   Cancelled Treatment:    Reason Eval/Treat Not Completed: (P) Patient at procedure or test/unavailable (pt off unit at HD dept.) Will continue efforts per PT plan of care as schedule permits.   Dorathy Kinsman Gala Padovano 12/24/2022, 10:53 AM

## 2022-12-24 NOTE — Progress Notes (Signed)
   12/24/22 0244  Provider Notification  Provider Name/Title [facility]  Date Provider Notified 12/24/22  Time Provider Notified 0246  Method of Notification Page (secure chat)  Notification Reason Other (Comment) (Patient has low BP 92/55, HR 52, MAP 67, Patient is coughing, meds given and wants some lab work done.Provider informed)  Provider response See new orders  Date of Provider Response 12/24/22  Time of Provider Response [phone]

## 2022-12-24 NOTE — Progress Notes (Signed)
D/C order noted. Contacted [facility] to advise clinic of pt's d/c today and that pt should resume care on Saturday.   Zelta Enfield Renal Navigator [phone] 

## 2022-12-24 NOTE — Discharge Instructions (Signed)
Tina Watkins, it was a pleasure taking care of you in the hospital.   You have appointment at the following places:  [name], NP [facility] Wednesday, December 4 at 11:20 AM  [name], PA [facility] gastrology office Wednesday, November 27 11:30 AM  Please make sure you follow-up with your oncologist and pulmonologist.  Please continue taking your medication as prescribed.  There has been no changes has been made on your medications.  If you have any of these following symptoms, please call us or seek care at an emergency department: -Chest Pain -Difficulty Breathing -Worsening abdominal pain -Syncope (passing out) -Drooping of face -Slurred speech -Sudden weakness in your leg or arm -Fever -Chills -blood in the stool -dark black, sticky stool  I am glad you are feeling better. It was a pleasure taking care for you. I wish a good recovery and good health!   Jeral Pinch, DO

## 2022-12-24 NOTE — Progress Notes (Signed)
Physical Therapy Treatment Patient Details Name: Tina Watkins MRN: [id number] DOB: 11-26-1965 Today's Date: 12/24/2022   History of Present Illness Pt is 57 yo female who presents on 12/18/22 with cough and diffuse body aches. Pt with suspected PNA. PMH: ESRD, HD T/TH/S, Bil adhesive capsulitis, R eye blindness, chronic LBP, CHF, DDD, depression, fibromyalgia, GERD, gout, Afib, kidney transplant, peripheral neuropathy, L basal ganglia CVA, R frontal CVA, chronic hypotension, cirrhosis, COPD, gastroparesis, malignant pulmonary nodule dx 10/24.    PT Comments  Pt received in supine after HD, pt agreeable to therapy session to work on gait and energy conservation. Pt SpO2 with poor signal during gait but DOE 2/4, improves after seated break. SpO2 WFL on RA once improved pleth signal achieved after warm blanket placed over pt's fingers. Pt given handouts to reinforce seated/standing BLE strengthening exercises and energy conservation as discussed previously in PT and OT sessions. Pt continues to benefit from PT services to progress toward functional mobility goals.     If plan is discharge home, recommend the following: Assistance with cooking/housework   Can travel by private vehicle        Equipment Recommendations  None recommended by PT    Recommendations for Other Services       Precautions / Restrictions Precautions Precautions: Fall Restrictions Weight Bearing Restrictions: No Other Position/Activity Restrictions: R eye blindness     Mobility  Bed Mobility Overal bed mobility: Needs Assistance Bed Mobility: Supine to Sit     Supine to sit: Contact guard, Used rails     General bed mobility comments: From flat bed; pt requesting HHA and PTA asked her to attempt without if she can, and without side rail, so pt reaches back to opposite bed rail behind her for support. CGA for safety    Transfers Overall transfer level: Needs assistance Equipment used: None Transfers:  Sit to/from Stand Sit to Stand: Supervision           General transfer comment: no lift assist needed    Ambulation/Gait Ambulation/Gait assistance: Supervision Gait Distance (Feet): 100 Feet Assistive device: None Gait Pattern/deviations: Step-through pattern, Decreased stride length       General Gait Details: pt pace limited by DOE 2/4; SpO2 poor signal, reading 77% on RA at one point, but pt has cold fingers and HR reading 50's bpm which is likely inaccurate given pt exertion. Once pt seated, SPO2 reading 84% on RA still with poor signal, warm blanket placed over pt hands and after ~1 minute of hands warming up, SpO2 reading 100% on RA and HR in 80's. Discussed activity pacing and energy conservation, handout printed to reinforce.   Stairs Stairs:  (pt reports no STE home)           Wheelchair Mobility     Tilt Bed    Modified Rankin (Stroke Patients Only)       Balance Overall balance assessment: Needs assistance   Sitting balance-Leahy Scale: Good Sitting balance - Comments: able to don socks seated on EOB   Standing balance support: No upper extremity supported, During functional activity Standing balance-Leahy Scale: Good Standing balance comment: gait no AD                            Cognition Arousal: Alert Behavior During Therapy: WFL for tasks assessed/performed, Anxious Overall Cognitive Status: Within Functional Limits for tasks assessed  Exercises      General Comments General comments (skin integrity, edema, etc.): No significant s/sx distress other than moderate DOE, improves with seated break, pt encouraged to work on energy conservation and consider getting 4WW or OPPT consult via PCP if endurance continues to decline. Also recommend pulse oximeter for home.      Pertinent Vitals/Pain Pain Assessment Pain Assessment: Faces Faces Pain Scale: Hurts a little bit Pain  Location: pt reports anticipation of pain but not localized anywhere Pain Descriptors / Indicators: Discomfort Pain Intervention(s): Limited activity within patient's tolerance, Monitored during session, Repositioned           PT Goals (current goals can now be found in the care plan section) Acute Rehab PT Goals Patient Stated Goal: return home PT Goal Formulation: With patient Time For Goal Achievement: 01/05/23 Progress towards PT goals: Progressing toward goals    Frequency    Min 1X/week      PT Plan         AM-PAC PT "6 Clicks" Mobility   Outcome Measure  Help needed turning from your back to your side while in a flat bed without using bedrails?: None Help needed moving from lying on your back to sitting on the side of a flat bed without using bedrails?: A Little Help needed moving to and from a bed to a chair (including a wheelchair)?: A Little Help needed standing up from a chair using your arms (e.g., wheelchair or bedside chair)?: A Little Help needed to walk in hospital room?: A Little Help needed climbing 3-5 steps with a railing? : A Lot 6 Click Score: 18    End of Session Equipment Utilized During Treatment: Gait belt Activity Tolerance: Patient tolerated treatment well;Other (comment) (DOE 2/4) Patient left: in chair;with family/visitor present;with nursing/sitter in room Nurse Communication: Mobility status PT Visit Diagnosis: Muscle weakness (generalized) (M62.81);Difficulty in walking, not elsewhere classified (R26.2)     Time: 1601-0932 PT Time Calculation (min) (ACUTE ONLY): 15 min  Charges:    $Gait Training: 8-22 mins PT General Charges $$ ACUTE PT VISIT: 1 Visit                     Tristan Bramble P., PTA Acute Rehabilitation Services Secure Chat Preferred 9a-5:30pm Office: [phone]    Angus Palms 12/24/2022, 6:50 PM

## 2022-12-24 NOTE — Progress Notes (Signed)
   12/24/22 1411  Vitals  Temp 98.4 F (36.9 C)  Pulse Rate (!) 55  Resp 20  BP (!) 103/53  SpO2 100 %  O2 Device Room Air  Weight 48.5 kg  Type of Weight Post-Dialysis  Oxygen Therapy  Patient Activity (if Appropriate) In bed   Received patient in bed to unit.  Alert and oriented.  Informed consent signed and in chart.   TX duration:2.45---Rita B NP aware  Patient tolerated well.  Transported back to the room  Alert, without acute distress.  Hand-off given to patient's nurse.   Access used: R femoral HD catheter --tunnelled Access issues: lines reversed from the onset  Total UF removed: 1300 Medication(s) given: albumin 12.5 grams iv x 1   [facility]

## 2022-12-24 NOTE — Progress Notes (Signed)
[facility] Progress Note   Subjective: Ongoing issues with hypotension and nausea. HD today on schedule.   Objective Vitals:   12/24/22 0113 12/24/22 0133 12/24/22 0359 12/24/22 0839  BP: (!) 93/53 (!) 92/55 101/60 (!) 92/57  Pulse: (!) 54 (!) 52 (!) 56 (!) 51  Resp:   18 18  Temp:   97.7 F (36.5 C) 97.8 F (36.6 C)  TempSrc:   Oral Oral  SpO2: 100% 100% 100% 97%  Weight:      Height:       Physical Exam General: Chronically ill appearing female in NAD Heart: S1,S2 No M/R/G Lungs: Decreased breath sounds LLL few bibasilar crackles Abdomen: NABS, non tender Extremities: No LE edema but edema upper thighs and hips Dialysis Access: R femoral TDC Drsg intact    Additional Objective Labs: Basic Metabolic Panel: Recent Labs  Lab 12/21/22 0840 12/22/22 0835 12/23/22 0747  NA 129* 128* 133*  K 3.8 3.7 3.7  CL 93* 93* 95*  CO2 21* 22 26  GLUCOSE 95 79 79  BUN 22* 28* 15  CREATININE 8.56* 9.90* 6.21*  CALCIUM 9.8 8.7* 9.3  PHOS 5.1* 6.0* 4.7*   Liver Function Tests: Recent Labs  Lab 12/18/22 0923 12/21/22 0840 12/22/22 0835 12/22/22 0848 12/23/22 0747  AST 15  --   --  26  --   ALT 14  --   --  12  --   ALKPHOS 156*  --   --  141*  --   BILITOT 1.0  --   --  0.5  --   PROT 6.3* 6.7  --  6.2*  --   ALBUMIN 2.9* 2.7* 2.4* 2.7* 2.4*   No results for input(s): "LIPASE", "AMYLASE" in the last 168 hours. CBC: Recent Labs  Lab 12/19/22 0443 12/20/22 0633 12/21/22 0840 12/22/22 0839 12/23/22 0747  WBC 7.2 7.9 8.1 8.1 6.6  HGB 7.8* 8.1* 9.5* 7.8* 8.4*  HCT 25.6* 26.9* 30.7* 24.8* 26.5*  MCV 91.1 92.4 91.1 90.2 89.5  PLT 219 241 233 201 181   Blood Culture    Component Value Date/Time   SDES EXPECTORATED SPUTUM 12/20/2022 1806   SDES EXPECTORATED SPUTUM 12/20/2022 1806   SPECREQUEST NONE 12/20/2022 1806   SPECREQUEST NONE Reflexed from [id number] 12/20/2022 1806   CULT FEW STAPHYLOCOCCUS HAEMOLYTICUS 12/20/2022 1806   REPTSTATUS  12/20/2022 FINAL 12/20/2022 1806   REPTSTATUS 12/23/2022 FINAL 12/20/2022 1806    Cardiac Enzymes: Recent Labs  Lab 12/18/22 0923 12/18/22 1100  CKTOTAL 45 45   CBG: No results for input(s): "GLUCAP" in the last 168 hours. Iron Studies:  Recent Labs    12/23/22 1036  IRON 138  TIBC 167*  FERRITIN 1,933*   @lablastinr3 @ Studies/Results: US Abdomen Limited RUQ (LIVER/GB)  Result Date: 12/23/2022 CLINICAL DATA:  Cirrhosis. EXAM: ULTRASOUND ABDOMEN LIMITED RIGHT UPPER QUADRANT COMPARISON:  None Available. FINDINGS: Gallbladder: No gallstones or wall thickening visualized. No sonographic Murphy sign noted by sonographer. Common bile duct: Diameter: 5 mm Liver: Heterogeneous lobular appearance to the liver parenchyma. portal vein is patent on color Doppler imaging with normal direction of blood flow towards the liver. Other: None. IMPRESSION: Distended gallbladder.  No stones or ductal dilatation. Heterogeneous lobular liver. Electronically Signed   By: Karen Kays M.D.   On: 12/23/2022 11:38   Medications:  anticoagulant sodium citrate     piperacillin-tazobactam (ZOSYN)  IV 2.25 g (12/24/22 0521)    allopurinol  100 mg Oral Daily   amiodarone  100 mg  Oral Daily   apixaban  2.5 mg Oral BID   calcitRIOL  1 mcg Oral Q T,Th,Sa-HD   Chlorhexidine Gluconate Cloth  6 each Topical Q0600   dorzolamide-timolol  1 drop Left Eye BID   escitalopram  20 mg Oral Daily   famotidine  10 mg Oral Daily   fluticasone furoate-vilanterol  1 puff Inhalation Daily   heparin  2,000 Units Dialysis Once in dialysis   metoCLOPramide  10 mg Oral TID AC   midodrine  15 mg Oral TID WC   mometasone-formoterol  2 puff Inhalation BID   scopolamine  1 patch Transdermal Q72H   sucroferric oxyhydroxide  500 mg Oral TID WC     OP HD: TTS G-O  4h  400/700  48.9kg   2/2 bath  R fem TDC    - Heparin 2000 units IV   - last HD 11/12, post wt 49.3kg - rocaltrol 2.75 mcg PO TIW - venofer 50 mg IV weekly - NO  ESA D/T new Dx lung cancer   Assessment/Plan Acute hypoxic respiratory failure secondary to L HCAP S/P bronch per pulmonary 11/16/2022.  L Pleural effusion-was being followed by pulmonary. Cytology negative. PCCM signed off.  Malignant lung nodule of LUL-W/U per pulmonary. Oncology consulted.  ESRD-HD TTS  Next HD 12/26/2022. Labs pending.  HTN/volume-HD yesterday Net UF 2.4 liters no post wt done. UF as tolerated. Continue midodrine 15 mg 3 times daily. Watch for bradycardia on high dose midodrine.  COPD- on bronchodilators /52.5-pack-year smoking history Anemia of ESRD-Hb 8.4 follow-up trend transfuse Hgb below 7, no ESA 2/2 lung cancer diagnosis. High [place] DC venofer on DC.  Secondary hyperparathyroidism -calcium 9.5 corrected Ca 10.5 decrease VDRA dose. Po4 at [facility].  Low albumin-start protein supplements.    Disposition: Hopefully discharge post HD today. Will F/U with oncology as OP.   Austine Wiedeman H. Szymon Foiles NP-C 12/24/2022, 9:37 AM  [employer] [phone]

## 2022-12-24 NOTE — Discharge Summary (Addendum)
Name: Tina Watkins MRN: [id number] DOB: May 04, 1965 57 y.o. PCP: Ivonne Andrew, NP  Date of Admission: 12/18/2022  8:58 AM Date of Discharge:  12/24/2022 Attending Physician: Dr.  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   Neuropathy   Hypotension   Pneumonia of left upper lobe due to infectious organism   Acute respiratory failure with hypoxia (HCC)   Shortness of breath   Hepatic cirrhosis (HCC) - radiographic finding   Pulmonary hypertension (HCC)- radiographic finding; R heart cath unsuccessful   Lesion of pancreas   Multiple lung nodules    DISCHARGE MEDICATIONS:   Allergies as of 12/24/2022       Reactions   Levofloxacin Itching, Rash   Tape Other (See Comments)   "Certain surgical tapes peel off my skin"        Medication List     STOP taking these medications    alendronate 70 MG tablet Commonly known as: FOSAMAX   amoxicillin-clavulanate 875-125 MG tablet Commonly known as: AUGMENTIN   ondansetron 8 MG disintegrating tablet Commonly known as: ZOFRAN-ODT       TAKE these medications    Advair HFA 115-21 MCG/ACT inhaler Generic drug: fluticasone-salmeterol Inhale 2 puffs into the lungs 2 (two) times daily.   allopurinol 100 MG tablet Commonly known as: ZYLOPRIM Take 0.5 tablets (50 mg total) by mouth 2 (two) times a week. What changed:  how much to take when to take this   amiodarone 200 MG tablet Commonly known as: PACERONE Take 0.5 tablets (100 mg total) by mouth daily.   apixaban 2.5 MG Tabs tablet Commonly known as: Eliquis Take 1 tablet (2.5 mg total) by mouth 2 (two) times daily. Okay to restart this medication on 11/18/2022   Colchicine 0.6 MG  Caps Take 0.6 mg by mouth daily as needed (gout flare up).   cyclobenzaprine 5 MG tablet Commonly known as: FLEXERIL Take 1 tablet (5 mg total) by mouth at bedtime as needed.   diclofenac Sodium 1 % Gel Commonly known as: Voltaren Apply 4 g topically as needed.   diphenhydrAMINE 25 mg capsule Commonly known as: BENADRYL Take 1 capsule (25 mg total) by mouth every 8 (eight) hours as needed.   dorzolamide-timolol 2-0.5 % ophthalmic solution Commonly known as: COSOPT Place 1 drop into the left eye 2 (two) times daily.   escitalopram 20 MG tablet Commonly known as: LEXAPRO Take 20 mg by mouth daily.   famotidine 20 MG tablet Commonly known as: PEPCID TAKE 1 TABLET BY MOUTH TWICE A DAY   gabapentin 100 MG capsule Commonly known as: NEURONTIN Take 100 mg by mouth 3 (three) times daily as needed (nerve pain).   lidocaine 5 % Commonly known as: LIDODERM Place 1 patch onto the skin daily as needed (for pain- remove old patch first).   metoCLOPramide 10 MG tablet Commonly known as: Reglan Take 1 tablet (10 mg total) by mouth 3 (three) times daily before meals.   midodrine 10 MG tablet Commonly known as: PROAMATINE Take 1.5 tablets (15 mg total) by mouth 3 (three) times daily. NEEDS FOLLOW UP APPOINTMENT FOR ANYMORE REFILLS   omeprazole 40 MG capsule Commonly known as: PRILOSEC TAKE 1 CAPSULE (40 MG  TOTAL) BY MOUTH DAILY.   oxyCODONE-acetaminophen 10-325 MG tablet Commonly known as: PERCOCET Take 1 tablet by mouth 4 (four) times daily as needed for pain.   Velphoro 500 MG chewable tablet Generic drug: sucroferric oxyhydroxide Chew 500 mg by mouth 3 (three) times daily.   zolpidem 10 MG tablet Commonly known as: AMBIEN Take 1 tablet (10 mg total) by mouth at bedtime as needed for sleep.        DISPOSITION AND FOLLOW-UP:  [name] was discharged from [facility] in Stable condition. At the hospital follow up visit please  address:  Acute hypoxic respiratory failure/postobstructive pneumonia/left-sided pleural effusion/malignant nodule of the left upper lobe: Patient had left thoracentesis on 11/17, cytology did not show any malignant cells.  Patient was treated with Zosyn 6 days, doxycycline 3 days, 1 dose of Augmentin.  Patient was followed by oncology and pulmonology inpatient, oncology plans to see the patient outpatient once the lab results of the biopsies are back. She is otherwise stable.   Liver cirrhosis/pancreatic tail cystic lesion: Please follow-up on mitochondrial antibody, please order CEA and Cancer antigen 19-9 so the oncology can follow up on it.  An appointment with [facility] is scheduled on November 27 at 11:30 PM so  they can follow-up on the pancreatic tail cystic lesion and liver cirrhosis. Acute gout flare: Take allopurinol 100 mg, half a tablet by mouth 2 times a week to prevent future gout flare. ESRD on hemodialysis: She has received 3 sessions of dialysis here inpatient. A flutter: Continue home medication amiodarone 100 mg daily and Eliquis 2.5 mg twice daily COPD: Continue Breo Ellipta 2 puffs twice daily Glaucoma: COPOST eyedrops daily Chronic pain: Continue home medication Percocet 10 325 mg every 4 hours, Flexeril 5 mg as needed, Voltaren, gabapentin 100 mg 3 times daily, lidocaine patch as needed Gastroparesis/GERD: Continue home medication Reglan 10 mg in the morning, Pepcid 10 mg twice daily, omeprazole 40 mg daily, Zofran 8 mg daily Insomnia: Continue home medication Ambien 5 mg daily  PCP: FOLLOW UP on the Immunohistochemistry stains on her biopsy to see if an official diagnoses has been made. IF NOT, please order CT chest in 3 weeks to see if PNA has clear, followed by ordering TTNA per IR for lung nodule biopsy.  ORDER CEA and CA 19-9; oncology, Dr. Arlana Pouch, will follow.   Follow-up Recommendations: Consults: IR for TTNA if no diagnosis can be made from initial biopsy. Labs:  Basic Metabolic Profile, CA 125, and CBC, CA 19-9 Studies: CT CHEST to see PNA has cleared in 3 WEEKS.  Medications: As above.   Follow-up Appointments:  Follow-up Information     Ivonne Andrew, NP. Go on 01/06/2023.   Specialties: Pulmonary Disease, Endocrinology Why: Hospital Follow up. AT 11:20 AM Contact information: [address] [place] [address] [phone]         [place], [place]. Go on 12/30/2022.   Specialty: Gastroenterology Why: AT 11:30 AM Contact information: [address] [place] [address] [phone]                 HOSPITAL COURSE:  Acute hypoxic respiratory failure Postobstructive pneumonia Left-sided pleural effusion Malignant nodule of the left upper lobe Presented with shortness of breath and productive cough for 4 days, with CXR 11/17 showing trace left pleural effusion, decreased.  No pneumothorax.  Increased interest opacities throughout both lungs and persistent left upper lobe collapse. PCCM following.  Patient had Left thoracentesis 11/17 that yielded 470 mL of hazy amber fluid; Pleural fluid protein was not sent to lab; LD fluid 54; LDH serum 131; total protein serum 6.7; appears to be transudate. Cytology showed no malignant cells; fluid cultures shows no growth x 3 days.  Respiratory culture grew few Staphylococcus hemolyticus with good sensitivities.  Engaged with the pharmacist, they recommended continuing course of Zosyn and not escalating therapy as patient is afebrile with no leukocytosis; no need to repeat chest x-ray.  Oncology was consulted and was seen on 11/20, recommended to follow-up as outpatient once repeat biopsy results are back.  Appreciate oncology's recommendation.  Patient has completed her course of Zosyn day 6, given one dose of Augmentin; doxycycline 3/3.    Since the cytology results did not show any malignant cells, initially PCCM planned for chest after pneumonia  improves in 3 to 4 weeks, then plan for TTNA in IR to get more specific tissue diagnosis; however pathology reached out to [name] 11/20 stating that the initial biopsy done by Dr. Delton Coombes on 10/14 were not stained with immunohistochemistry stains; they will f/u once the results are back. Therefore, currently we will wait on the initial biopsy done by Dr. Delton Coombes on 10/14 to see for formal diagnosis can be made prior to proceeding with repeat imaging and TTNA in IR.    #Liver Cirrhosis MRI abdomen on 10/9 that showed severe hepatic and splenic iron deposition, coarse nodular contour of the liver consistent with cirrhosis.  Hepatitis B surface antigen non reactive, hepatitis B antibody showed immunity. AST/ALT 26, 12 respectively. Would like to evaluate for liver cirrhosis and rule out [facility] in the setting of left-sided pleural effusion and malignant nodule of the left upper lobe.  PTT 16.2, INR 1.3, APTT 51, alkaline phosphatase 141, normal AST/ALT.right upper quadrant ultrasound showing distended gallbladder, heterogeneous lobular appearance to the liver parenchyma with patent portal vein. HCV antibody non reactive, ANA negative. Mitochondrial antibodies can be follow-up outpatient.   #Pancreatic tail cystic lesion MRI abdomen on 10/9 showed heterogeneous cystic lesion arising from the tip of the pancreatic tail measuring 4.4 x 2.3 x 3.3 cm concerning for a partially cystic malignancy with a significant solid component. Oncology on board, recommended outpatient follow-up with GI. Outpatient follow-up with [facility] is scheduled on November 27 at 11:30 PM.   #Acute on chronic hypotension Patient blood pressures have been stable; with MAP >60s. No acute concerns at this time.  Continue home dose of midodrine 50 mg 3 times daily; Plan for goal MAP greater than 60, systolic rhythm 80.   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  Patient received her last dialysis inpatient session today.    #Acute gout flare Symptoms has resolved.   Chronic conditions #A Flutter- Rate/Rhythm is regular at time of admission. Continue Amiodarone 100 mg daily, Eliquis 2.5 mg BID #COPD- No active exacerbation. Breo Ellipta 2 puffs BID #Depression- Lexapro 20 mg daily  #Glaucoma- COSOPT eyes drops daily   #Chronic Pain- Continue Percocet 10-325 mg q4h, Flexeril 5 mg prn, Voltaren gel, Gabapentin 100 mg TID, Lidocaine patch prn  #Gastroparesis/GERD- Continue Reglan 10 mg in morning, Pepcid 10 mg BID. Hold Omeprazole 40 mg daily, Zofran 8 mg prn. #Insomnia- Ambien 5mg   DISCHARGE INSTRUCTIONS:   Discharge  Instructions     Diet - low sodium heart healthy   Complete by: As directed    Discharge instructions   Complete by: As directed    [name], it was a pleasure taking care of you in the hospital.   You have appointment at the following places:  [name], NP [place] [facility] Wednesday, December 4 at 11:20 AM  [name], PA [facility] gastrology office Wednesday, November 27 11:30 AM  Please continue taking your medication as prescribed.  There has been no changes has been made on your medications, except for  GOUT: -Allopurinol 100 mg, take half a tablet by mouth 2 times a week  If you have any of these following symptoms, please call us or seek care at an emergency department: -Chest Pain -Difficulty Breathing -Worsening abdominal pain -Syncope (passing out) -Drooping of face -Slurred speech -Sudden weakness in your leg or arm -Fever -Chills -blood in the stool -dark black, sticky stool  I am glad you are feeling better. It was a pleasure taking care for you. I wish a good recovery and good health!   Delawrence Fridman, DO   Increase activity  slowly   Complete by: As directed    No wound care   Complete by: As directed        SUBJECTIVE:  Patient was evaluated bedside today, laying comfortably, afebrile, no acute distress.  She reports overall she is doing well, no chest pain, shortness of breath, abdominal pain, nausea, vomiting.  She reports overall she is doing well.  She has no other concerns at this time.  Discharge Vitals:   BP (!) 103/53   Pulse (!) 55   Temp 98.4 F (36.9 C)   Resp 20   Ht 4\' 11"  (1.499 m)   Wt 48.5 kg   SpO2 100%   BMI 21.60 kg/m   OBJECTIVE:  Physical Exam   Constitutional: appears ill though no acute distress Cardiovascular: regular rate, no murmurs.  Pulmonary/Chest: +decreased breath sounds LUL, LLL anteriorly.  Abdominal: soft, no tenderness to palpation, bowel sounds present Skin: warm and dry Psych: normal mood and behavior  Pertinent Labs, Studies, and Procedures:     Latest Ref Rng & Units 12/24/2022   11:14 AM 12/23/2022    7:47 AM 12/22/2022    8:39 AM  CBC  WBC 4.0 - 10.5 K/uL 7.8  6.6  8.1   Hemoglobin 12.0 - 15.0 g/dL 7.4  8.4  7.8   Hematocrit 36.0 - 46.0 % 24.0  26.5  24.8   Platelets 150 - 400 K/uL 183  181  201        Latest Ref Rng & Units 12/24/2022   11:14 AM 12/23/2022    7:47 AM 12/22/2022    8:48 AM  CMP  Glucose 70 - 99 mg/dL 81  79    BUN 6 - 20 mg/dL 22  15    Creatinine 2.95 - 1.00 mg/dL 6.21  3.08    Sodium 657 - 145 mmol/L 132  133    Potassium 3.5 - 5.1 mmol/L 3.3  3.7    Chloride 98 - 111 mmol/L 93  95    CO2 22 - 32 mmol/L 24  26    Calcium 8.9 - 10.3 mg/dL 9.0  9.3    Total Protein 6.5 - 8.1 g/dL   6.2   Total Bilirubin <1.2 mg/dL   0.5   Alkaline Phos 38 - 126 U/L   141  AST 15 - 41 U/L   26   ALT 0 - 44 U/L   12     CT Angio Chest PE W and/or Wo Contrast  Result Date: 12/18/2022 CLINICAL DATA:  Shortness of breath EXAM: CT ANGIOGRAPHY CHEST WITH CONTRAST TECHNIQUE: Multidetector CT imaging of the chest was performed using  the standard protocol during bolus administration of intravenous contrast. Multiplanar CT image reconstructions and MIPs were obtained to evaluate the vascular anatomy. RADIATION DOSE REDUCTION: This exam was performed according to the departmental dose-optimization program which includes automated exposure control, adjustment of the mA and/or kV according to patient size and/or use of iterative reconstruction technique. CONTRAST:  60mL OMNIPAQUE IOHEXOL 350 MG/ML SOLN COMPARISON:  Chest x-ray today.  Chest CT 11/11/2022 FINDINGS: Cardiovascular: No filling defects in the pulmonary arteries to suggest pulmonary emboli. Heart is normal size. Aorta is normal caliber. Scattered aortic atherosclerosis. There appears to be stenosis in the left brachiocephalic vein and upper SVC with extensive collateral venous channels in the left chest. Aortic atherosclerosis. Mediastinum/Nodes: Right paratracheal adenopathy with lymph node measuring up to 1.9 cm in short axis diameter on image 27, stable since prior study. No visible hilar or axillary adenopathy. Trachea and esophagus are unremarkable. Thyroid unremarkable. Lungs/Pleura: Complete consolidation of the left upper lobe, new since prior study. Previously described left upper lobe mass cannot be visualized due to complete consolidation. Moderate left pleural effusion. Dependent opacities posteriorly right lower lobe, stable and likely reflecting atelectasis or scarring. Numerous small nodules throughout the right lung, appear to be increasing in number since prior study. Upper Abdomen: No acute findings Musculoskeletal: No acute bony abnormality. Review of the MIP images confirms the above findings. IMPRESSION: No evidence of pulmonary embolus. Complete consolidation of the left upper lobe. Moderate left pleural effusion. Numerous small nodules throughout the right lung appear to be increasing in number since prior study. 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 Stable mediastinal adenopathy. Aortic Atherosclerosis (ICD10-I70.0). Electronically Signed   By: Charlett Nose M.D.   On: 12/18/2022 20:22   DG Chest 2 View  Result Date: 12/18/2022 CLINICAL DATA:  Shortness of breath. EXAM: CHEST - 2 VIEW COMPARISON:  11/16/2022. FINDINGS: Since the prior study, there is new homogeneous opacification of left upper hemithorax. There also heterogeneous opacities in the left lower hemithorax. There are increased interstitial markings in the right lung which may represent mild underlying pulmonary edema. Right lung is otherwise clear. No dense consolidation or lung collapse. Bilateral costophrenic angles are clear. Stable cardio-mediastinal silhouette. Redemonstration of vascular stent in the superior vena cava. No acute osseous abnormalities. Partially seen lower cervical spinal fixation hardware. The soft tissues are within normal limits. IMPRESSION: *Findings favor near complete left upper lobe lung collapse. Contrast-enhanced CT scan chest and/or bronchoscopy is recommended. *Increased interstitial markings in the right lung may represent mild underlying pulmonary edema. Correlate clinically. *No pleural effusion. Electronically Signed   By: Jules Schick M.D.   On: 12/18/2022 12:02     Signed: Jeral Pinch, D.O.  Internal Medicine Resident, PGY-1 [facility]  Pager: [phone] 2:28 PM, 12/24/2022

## 2022-12-24 NOTE — Discharge Planning (Signed)
[facility] Patient Discharge Orders- [facility] CLINIC: [facility]  Patient's name: Tina Watkins Admit/DC Dates: 12/18/2022 - 12/24/2022  Discharge Diagnoses: Lung cancer, upper lobe      Aranesp: Given: No   Date and amount of last dose: NA Last Hgb: 7.4 PRBC's Given: No Date/# of units: NA ESA dose for discharge: HOLD ESA UNTIL WORK UP BY ONCOLOGY. TRANSFUSE FOR HGB < 7.0  IV Iron dose at discharge: Per protocol  Heparin change: No  EDW Change: No New EDW: No  Bath Change: No  Access intervention/Change: No Details:  Hectorol/Calcitriol change: No  Discharge Labs: Calcium 9.0 Phosphorus 5.5 Albumin 2.5 K+ 3.3  IV Antibiotics: NA Details:  On Coumadin?: NA Last INR: Next INR: Managed By:   OTHER/APPTS/LAB ORDERS:    D/C Meds to be reconciled by nurse after every discharge.  Completed By: Alonna Buckler [facility] [phone]    Reviewed by: MD:______ RN_______

## 2022-12-25 LAB — CULTURE, BODY FLUID W GRAM STAIN -BOTTLE: Culture: NO GROWTH

## 2022-12-25 LAB — CYTOLOGY - NON PAP

## 2022-12-26 ENCOUNTER — Telehealth: Payer: Self-pay | Admitting: Physician Assistant

## 2022-12-26 NOTE — Telephone Encounter (Signed)
Transition of Care - Initial Contact from Inpatient Facility  Date of discharge: 12/24/22 Date of contact: 12/26/22  Method: Phone Spoke to: Patient  Patient contacted to discuss transition of care from recent inpatient hospitalization. Patient was admitted to [facility] from ... with discharge diagnosis of lung cancer  The discharge medication list was reviewed. Patient understands the changes and has no concerns.   Patient will return to his/her outpatient HD unit on: 12/27/22. Reviewed other appointments as well.   No other concerns at this time. Patient reports she is independent of ADLs and does not feel like she needs any other equipment of therapies.  Rogers Blocker, PA-C 12/26/2022, 10:27 AM  [facility] Pager: [phone]

## 2022-12-30 ENCOUNTER — Other Ambulatory Visit: Payer: Medicare Other

## 2022-12-30 ENCOUNTER — Encounter: Payer: Self-pay | Admitting: Physician Assistant

## 2022-12-30 ENCOUNTER — Ambulatory Visit: Payer: Medicare Other | Admitting: Physician Assistant

## 2022-12-30 VITALS — BP 98/62 | HR 63 | Ht 59.0 in | Wt 109.2 lb

## 2022-12-30 DIAGNOSIS — K3184 Gastroparesis: Secondary | ICD-10-CM | POA: Diagnosis not present

## 2022-12-30 DIAGNOSIS — R197 Diarrhea, unspecified: Secondary | ICD-10-CM | POA: Diagnosis not present

## 2022-12-30 DIAGNOSIS — R935 Abnormal findings on diagnostic imaging of other abdominal regions, including retroperitoneum: Secondary | ICD-10-CM

## 2022-12-30 DIAGNOSIS — K7460 Unspecified cirrhosis of liver: Secondary | ICD-10-CM

## 2022-12-30 DIAGNOSIS — C3410 Malignant neoplasm of upper lobe, unspecified bronchus or lung: Secondary | ICD-10-CM

## 2022-12-30 NOTE — Progress Notes (Signed)
Chief Complaint: Follow-up gastroparesis as well as concern for abnormal pancreatic imaging and diarrhea  HPI:    Tina Watkins is a 57 year old African-American female with a past medical history as listed below including A-fib on Eliquis CHF, ESRD on HD, gastroparesis, fibromyalgia, GERD, recent diagnosis of lung cancer and abnormal pancreatic imaging as well as multiple others, who presents to clinic today for follow-up of her gastroparesis and gallbladder workup as well as abnormal pancreatic imaging and diarrhea.    This patient is very complicated and has many things going on right now.    12/02/2022 patient seen in clinic by Dr. Russella Dar and at that time discussed nausea, vomiting and epigastric pain.  Noted that she had gastroparesis and was on Zofran and Reglan which were helpful but had to reduce dosing due to ESRD.  She was diagnosed with left upper lung lung cancer a few weeks back and had an oncology appointment set at that time.  Pancreatic tail cystic lesion was instantly noted on CT and was followed with an MRI.  At that point rule out gastroparesis exacerbation versus mesenteric insufficiency with HD, cirrhosis was noted on CT, also had a personal history of adenomatous polyps, chronic diastolic heart failure and ESRD and A-fib on Eliquis.  Reglan was increased to 10 mg p.o. 3 times daily AC for 10 months, recommended a HIDA scan with CCK, also continued on Meprazole 40 mg daily and Famotidine 20 twice daily.  Labs ordered including CBC, CMP, PT/INR and AFP.  Recommended deferring a further evaluation of pancreatic cyst/cirrhosis until lung malignancy addressed by oncology.    12/18/2022-12/24/2022 patient admitted to the hospital with lung cancer.  Noted acute hypoxic respiratory failure and postobstructive pneumonia with left-sided pleural effusion.  She was treated with Zosyn and doxycycline as well as Augmentin with plans to see oncology outpatient.  They discussed liver cirrhosis with a  positive AMA, negative/normal ANA.  Recommended checking a CEA and cancer antigen 19 9.  Also had an acute gout flare and was on Allopurinol.  Noted gastroparesis and continued on Reglan 10 mg every morning.    12/23/2022 patient consulted by medical oncology in the hospital they wanted further workup of her cystic pancreatic lesion as well as a CEA and CA 19-9.  They recommended further repeat biopsy results to determine treatment for lung cancer.    Today, patient presents to clinic accompanied by her son.  Her primary concern is her gallbladder.  Apparently had called initially and wanting further workup for her gallbladder as someone had similar symptoms to her and it was all that her gallbladder.  She never had HIDA scan with CCK given that she wanted to the hospital as above.  She is really unaware of the pancreatic cystic lesion concerning for cancer, but her son remembers being told this.  She does continue with some episodes of vomiting, but really has not had trouble with this since being in the hospital recently.  Tells me her kidney doctors told her she could only take Reglan 10 mg a day.  Currently she is using 5 mg before breakfast and another 5 mg before dinner.  Again has not really had any flares of the symptoms recently.    More concerning to her is her diarrhea which she has been experiencing at least 3-4 loose stools a day and at nighttime it is "uncontrollable", this is ever since being in the hospital on multiple antibiotics.  She has not tried any over-the-counter antidiarrheals.  Also discusses her cirrhosis.  Tells me she does not drink a lot of alcohol.  Maybe has 1 beer every 3 weeks.  Wonders if she needs to quit drinking.    Denies fever, chills, blood in her stool or weight loss.  GI workup: EGD Aug 2022 - Small hiatal hernia and associated Cameron's erosions.  - Mild, non-specific gastritis. Biopsies taken to check for H. pylori  - The examination was otherwise  normal. Path: reactive gastropathy, HP negative   Colonoscopy Aug 2022 - One 3 mm polyp in the ascending colon, removed with a cold snare. Resected and retrieved.  - Internal hemorrhoids which started to bleed after retroflex view and stopped without intervention.  - The examination was otherwise normal on direct and retroflexion views Path: TA   Past Medical History:  Diagnosis Date   Anxiety    Bacteremia due to Gram-negative bacteria 05/23/2011   Blind    right eye   Blind right eye    CHF (congestive heart failure) (HCC)    Chronic lower back pain    Complication of anesthesia    "woke up during OR; I have an extremely high tolerance" (12/11/2011) 1 procedure was graft; the other procedures were procedures that are typically done with sedation.   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   Dysrhythmia    "tachycardia" (12/11/2011) new onset afib 10/15/14 EKG   E coli bacteremia 06/18/2011   Elevated LDL cholesterol level 12/2018   ESRD (end stage renal disease) (HCC) 06/12/2011   Tues-Thurs-Sat dialysis   Fibromyalgia    Gastroparesis    GERD (gastroesophageal reflux disease)    Glaucoma    right eye   Gout    H/O carpal tunnel syndrome    Headache 10/2019   no current problem per patient on 11/12/22   Headache(784.0)    "not often anymore" (12/11/2011)   Herpes genitalia 1994   History of blood transfusion    "more than a few times" (12/11/2011)   History of COVID-19, most recently 09/2022 05/09/2019   History of deep vein thrombosis (DVT) of right lower extremity 11/20/2017   History of stomach ulcers    Hypotension    Iron deficiency anemia    New onset a-fib (HCC)    10/15/14 EKG   Osteopenia    Peripheral neuropathy 11/2018   Pneumonia    x several but years ago   Pressure ulcer of sacral region, stage 1 07/2019   Seizures (HCC) 1994   "post transplant; only have had that one" (12/11/2011)   Spinal  stenosis in cervical region    Stroke [facility])     left basal ganglia lacunar infarct; Right frontal lobe lacunar infarct.   Stroke [facility]) ~ 1999; 2001   "briefly lost my vision; lost my right eye" (12/11/2011)   Vitamin D deficiency 12/2018    Past Surgical History:  Procedure Laterality Date   ANTERIOR CERVICAL DECOMP/DISCECTOMY FUSION N/A 01/08/2015   Procedure: Anterior Cervical Three-Four/Four-Five Decompression/Diskectomy/Fusion;  Surgeon: Hilda Lias, MD;  Location: [facility] NEURO ORS;  Service: Neurosurgery;  Laterality: N/A;  C3-4 C4-5 Anterior cervical decompression/diskectomy/fusion   APPENDECTOMY  ~ 2004   BIOPSY  09/12/2020   Procedure: BIOPSY;  Surgeon: Rachael Fee, MD;  Location: [facility] ENDOSCOPY;  Service: Endoscopy;;   BRONCHIAL BIOPSY  11/16/2022   Procedure: BRONCHIAL BIOPSIES;  Surgeon: Leslye Peer, MD;  Location: [facility] ENDOSCOPY;  Service: Pulmonary;;   BRONCHIAL BRUSHINGS  11/16/2022   Procedure: BRONCHIAL BRUSHINGS;  Surgeon: Leslye Peer, MD;  Location: [facility] ENDOSCOPY;  Service: Pulmonary;;   BRONCHIAL NEEDLE ASPIRATION BIOPSY  11/16/2022   Procedure: BRONCHIAL NEEDLE ASPIRATION BIOPSIES;  Surgeon: Leslye Peer, MD;  Location: [facility] ENDOSCOPY;  Service: Pulmonary;;   CATARACT EXTRACTION     right eye   COLONOSCOPY     COLONOSCOPY WITH PROPOFOL N/A 09/12/2020   Procedure: COLONOSCOPY WITH PROPOFOL;  Surgeon: Rachael Fee, MD;  Location: [facility] ENDOSCOPY;  Service: Endoscopy;  Laterality: N/A;   ENUCLEATION  2001   "right"   ESOPHAGOGASTRODUODENOSCOPY (EGD) WITH PROPOFOL N/A 04/21/2012   Procedure: ESOPHAGOGASTRODUODENOSCOPY (EGD) WITH PROPOFOL;  Surgeon: Rachael Fee, MD;  Location: [facility] ENDOSCOPY;  Service: Endoscopy;  Laterality: N/A;   ESOPHAGOGASTRODUODENOSCOPY (EGD) WITH PROPOFOL N/A 09/12/2020   Procedure: ESOPHAGOGASTRODUODENOSCOPY (EGD) WITH PROPOFOL;  Surgeon: Rachael Fee, MD;  Location: [facility] ENDOSCOPY;  Service: Endoscopy;  Laterality: N/A;   FIDUCIAL MARKER  PLACEMENT  11/16/2022   Procedure: FIDUCIAL MARKER PLACEMENT;  Surgeon: Leslye Peer, MD;  Location: [facility] ENDOSCOPY;  Service: Pulmonary;;   HEMOSTASIS CONTROL  11/16/2022   Procedure: HEMOSTASIS CONTROL;  Surgeon: Leslye Peer, MD;  Location: [facility] ENDOSCOPY;  Service: Pulmonary;;   INSERTION OF DIALYSIS CATHETER  1988   "AV graft LUA & LFA; LUA worked for 1 day; LFA never worked"   IR FLUORO GUIDE CV LINE RIGHT  07/11/2019   IR FLUORO GUIDE CV LINE RIGHT  10/20/2019   IR FLUORO GUIDE CV LINE RIGHT  05/31/2020   IR PTA VENOUS EXCEPT DIALYSIS CIRCUIT  05/31/2020   IR RADIOLOGY PERIPHERAL GUIDED IV START  07/11/2019   IR US GUIDE VASC ACCESS RIGHT  07/11/2019   IR US GUIDE VASC ACCESS RIGHT  07/11/2019   IR US GUIDE VASC ACCESS RIGHT  07/11/2019   IR VENOCAVAGRAM IVC  05/31/2020   KIDNEY TRANSPLANT  1994; 1999; 2005   "right"   MULTIPLE TOOTH EXTRACTIONS     POLYPECTOMY  09/12/2020   Procedure: POLYPECTOMY;  Surgeon: Rachael Fee, MD;  Location: [facility] ENDOSCOPY;  Service: Endoscopy;;   RIGHT HEART CATH N/A 03/23/2019   Procedure: RIGHT HEART CATH;  Surgeon: Dolores Patty, MD;  Location: [facility] INVASIVE CV LAB;  Service: Cardiovascular;  Laterality: N/A;   TONSILLECTOMY     TOTAL NEPHRECTOMY  1988?; 1994; 2005   VIDEO BRONCHOSCOPY WITH RADIAL ENDOBRONCHIAL ULTRASOUND  11/16/2022   Procedure: VIDEO BRONCHOSCOPY WITH RADIAL ENDOBRONCHIAL ULTRASOUND;  Surgeon: Leslye Peer, MD;  Location: [facility] ENDOSCOPY;  Service: Pulmonary;;    Current Outpatient Medications  Medication Sig Dispense Refill   allopurinol (ZYLOPRIM) 100 MG tablet Take 0.5 tablets (50 mg total) by mouth 2 (two) times a week. 90 tablet 3   amiodarone (PACERONE) 200 MG tablet Take 0.5 tablets (100 mg total) by mouth daily. 90 tablet 1   apixaban (ELIQUIS) 2.5 MG TABS tablet Take 1 tablet (2.5 mg total) by mouth 2 (two) times daily. Okay to restart this medication on 11/18/2022     Colchicine 0.6 MG CAPS Take 0.6 mg by mouth daily as  needed (gout flare up). 30 capsule 2   cyclobenzaprine (FLEXERIL) 5 MG tablet Take 1 tablet (5 mg total) by mouth at bedtime as needed. 90 tablet 1   diclofenac Sodium (VOLTAREN) 1 % GEL Apply 4 g topically as needed. 50 g 0   diphenhydrAMINE (BENADRYL) 25 mg capsule Take 1 capsule (25 mg total) by mouth every 8 (eight) hours as  needed. 30 capsule 6   dorzolamide-timolol (COSOPT) 2-0.5 % ophthalmic solution Place 1 drop into the left eye 2 (two) times daily.     escitalopram (LEXAPRO) 20 MG tablet Take 20 mg by mouth daily.     famotidine (PEPCID) 20 MG tablet TAKE 1 TABLET BY MOUTH TWICE A DAY 180 tablet 3   fluticasone-salmeterol (ADVAIR HFA) 115-21 MCG/ACT inhaler Inhale 2 puffs into the lungs 2 (two) times daily. 1 each 12   gabapentin (NEURONTIN) 100 MG capsule Take 100 mg by mouth 3 (three) times daily as needed (nerve pain).     lidocaine (LIDODERM) 5 % Place 1 patch onto the skin daily as needed (for pain- remove old patch first).      metoCLOPramide (REGLAN) 10 MG tablet Take 1 tablet (10 mg total) by mouth 3 (three) times daily before meals. 90 tablet 2   midodrine (PROAMATINE) 10 MG tablet Take 1.5 tablets (15 mg total) by mouth 3 (three) times daily. NEEDS FOLLOW UP APPOINTMENT FOR ANYMORE REFILLS 405 tablet 0   omeprazole (PRILOSEC) 40 MG capsule TAKE 1 CAPSULE (40 MG TOTAL) BY MOUTH DAILY. 90 capsule 1   oxyCODONE-acetaminophen (PERCOCET) 10-325 MG tablet Take 1 tablet by mouth 4 (four) times daily as needed for pain.     VELPHORO 500 MG chewable tablet Chew 500 mg by mouth 3 (three) times daily.     zolpidem (AMBIEN) 10 MG tablet Take 1 tablet (10 mg total) by mouth at bedtime as needed for sleep. 10 tablet 0   No current facility-administered medications for this visit.    Allergies as of 12/30/2022 - Review Complete 12/20/2022  Allergen Reaction Noted   Levofloxacin Itching and Rash 05/23/2011   Tape Other (See Comments) 03/20/2019    Family History  Problem Relation Age  of Onset   Glaucoma Mother    Pancreatic cancer Father    Multiple sclerosis Brother    Diabetes Maternal Uncle    Hypertension Maternal Grandmother    Breast cancer Neg Hx     Social History   Socioeconomic History   Marital status: Single    Spouse name: Not on file   Number of children: 0   Years of education: some college   Highest education level: Some college, no degree  Occupational History   Occupation: disabled    Associate Professor: UNEMPLOYED  Tobacco Use   Smoking status: Every Day    Current packs/day: 1.50    Average packs/day: 1.5 packs/day for 35.0 years (52.5 ttl pk-yrs)    Types: Cigarettes    Passive exposure: Never   Smokeless tobacco: Never   Tobacco comments:    Pack of cigarettes lasts 2 days 09/18/2020    Patient now only smokes 5 cigarettes per day as of 11/12/22  Vaping Use   Vaping status: Never Used  Substance and Sexual Activity   Alcohol use: Not Currently    Comment: rarely   Drug use: Yes    Frequency: 1.0 times per week    Types: Marijuana    Comment: last use was 11/12/22 - informed to refrain 1-2 days prior procedure   Sexual activity: Not Currently    Partners: Male    Birth control/protection: None  Other Topics Concern   Not on file  Social History Narrative   Right-handed.   Four cups caffeine per day.   Lives alone.   Social Determinants of Health   Financial Resource Strain: Low Risk  (06/25/2022)   Overall Financial Resource Strain (CARDIA)  Difficulty of Paying Living Expenses: Not hard at all  Food Insecurity: No Food Insecurity (12/18/2022)   Hunger Vital Sign    Worried About Running Out of Food in the Last Year: Never true    Ran Out of Food in the Last Year: Never true  Transportation Needs: No Transportation Needs (12/18/2022)   PRAPARE - Administrator, Civil Service (Medical): No    Lack of Transportation (Non-Medical): No  Physical Activity: Inactive (06/25/2022)   Exercise Vital Sign    Days of  Exercise per Week: 0 days    Minutes of Exercise per Session: 0 min  Stress: Stress Concern Present (06/25/2022)   [employer] of Occupational Health - Occupational Stress Questionnaire    Feeling of Stress : Very much  Social Connections: Socially Isolated (06/25/2022)   Social Connection and Isolation Panel [NHANES]    Frequency of Communication with Friends and Family: More than three times a week    Frequency of Social Gatherings with Friends and Family: Once a week    Attends Religious Services: Never    Database administrator or Organizations: No    Attends Banker Meetings: Never    Marital Status: Never married  Intimate Partner Violence: Not At Risk (12/18/2022)   Humiliation, Afraid, Rape, and Kick questionnaire    Fear of Current or Ex-Partner: No    Emotionally Abused: No    Physically Abused: No    Sexually Abused: No    Review of Systems:    Constitutional: No weight loss, fever or chills Cardiovascular: No chest pain Respiratory: No SOB  Gastrointestinal: See HPI and otherwise negative   Physical Exam:  Vital signs: BP 98/62   Pulse 63   Ht 4\' 11"  (1.499 m)   Wt 109 lb 4 oz (49.6 kg)   BMI 22.07 kg/m    Constitutional:   Pleasant chronically ill appearing AA female appears to be in NAD, Well developed, Well nourished, alert and cooperative Respiratory: Respirations even and unlabored. Lungs clear to auscultation bilaterally.   No wheezes, crackles, or rhonchi.  Cardiovascular: Normal S1, S2. No MRG. Regular rate and rhythm. No peripheral edema, cyanosis or pallor.  Gastrointestinal:  Soft, nondistended, nontender. No rebound or guarding. Normal bowel sounds. No appreciable masses or hepatomegaly. Rectal:  Not performed.  Psychiatric: Oriented to person, place and time. Demonstrates good judgement and reason without abnormal affect or behaviors.  RELEVANT LABS AND IMAGING: CBC    Component Value Date/Time   WBC 7.8 12/24/2022 1114   RBC  2.69 (L) 12/24/2022 1114   HGB 7.4 (L) 12/24/2022 1114   HGB 10.1 (L) 10/13/2019 0916   HGB 6.7 (LL) 05/08/2019 0854   HCT 24.0 (L) 12/24/2022 1114   HCT 25.7 (L) 07/08/2019 1446   PLT 183 12/24/2022 1114   PLT 170 10/13/2019 0916   PLT 170 05/08/2019 0854   MCV 89.2 12/24/2022 1114   MCV 98 (H) 05/08/2019 0854   MCH 27.5 12/24/2022 1114   MCHC 30.8 12/24/2022 1114   RDW 20.1 (H) 12/24/2022 1114   RDW 21.9 (H) 05/08/2019 0854   LYMPHSABS 0.9 03/30/2021 0915   MONOABS 0.3 03/30/2021 0915   EOSABS 0.1 03/30/2021 0915   BASOSABS 0.0 03/30/2021 0915    CMP     Component Value Date/Time   NA 132 (L) 12/24/2022 1114   NA 138 05/08/2019 0854   K 3.3 (L) 12/24/2022 1114   CL 93 (L) 12/24/2022 1114   CO2 24  12/24/2022 1114   GLUCOSE 81 12/24/2022 1114   BUN 22 (H) 12/24/2022 1114   BUN 82 (HH) 05/08/2019 0854   CREATININE 8.21 (H) 12/24/2022 1114   CREATININE 4.30 (HH) 10/13/2019 0916   CALCIUM 9.0 12/24/2022 1114   CALCIUM 9.0 04/10/2019 0828   PROT 6.2 (L) 12/22/2022 0848   ALBUMIN 2.5 (L) 12/24/2022 1114   AST 26 12/22/2022 0848   AST 44 (H) 10/13/2019 0916   ALT 12 12/22/2022 0848   ALT 34 10/13/2019 0916   ALKPHOS 141 (H) 12/22/2022 0848   BILITOT 0.5 12/22/2022 0848   BILITOT 0.7 10/13/2019 0916   GFRNONAA 5 (L) 12/24/2022 1114   GFRNONAA 11 (L) 10/13/2019 0916   GFRAA 13 (L) 10/13/2019 0916    Assessment: 1.  Diarrhea: Acute 3-4 times a day watery and wakes her up at night since being in the hospital; consider infectious cause such as C. difficile versus possibly side effect from allopurinol which she was started on in the hospital 2.  Abnormal imaging of the pancreas: With cystic lesion concerning for pancreatic cancer, oncology would like a biopsy of this 3.  Gastroparesis: Chronic for the patient, thought related to possibly hemodialysis, there was some discussion about workup of her gallbladder, currently not having symptoms on Reglan 5 mg twice daily, was told  she cannot take a higher dose by dialysis 4.  Lung cancer: Plan for repeat biopsy per oncology team 5.  Cirrhosis: Seen on imaging, platelets normal, ANA normal, AMA elevated; consider autoimmune cause versus other  Plan: 1.  At this point discussed with the patient that I think a HIDA scan with CCK is her lowest priority.  She is really not have any trouble with the symptoms lately.  More concerning is the thought of pancreatic cancer and acutely her diarrhea.  We are going to handle these things today and she will need to come back in for another appointment to discuss further workup of cirrhosis and gallbladder if needed. 2.  Will send chart to Dr. Meridee Score, hoping he will be the patient's new physician after Dr. Ardell Isaacs retirement given her abnormal imaging of the pancreas and likely need for EUS/FNA.  This was requested by the oncology team.  Ordered a CA 19-9 and AFP today. 3.  Ordered a GI pathogen panel for acute diarrhea.  Suspicion for C. difficile given recent hospitalization and antibiotics. 4.  In the future will need to consider further workup of cirrhosis, AMA was positive but ANA normal, may need a liver biopsy for further determination.  Question timing of this 5.  Patient would like a HIDA scan with CCK at some point.  Discussed this is low priority.  This can be ordered in the future pending workup as above. 6.  Patient will follow in clinic with Dr. Meridee Score in January.  She is very complicated with multiple medical problems and would benefit from his expert evaluation.  [name], PA-C [facility] 12/30/2022, 11:31 AM  Cc: Ivonne Andrew, NP

## 2022-12-30 NOTE — Patient Instructions (Signed)
Your provider has requested that you go to the basement level for lab work before leaving today. Press "B" on the elevator. The lab is located at the first door on the left as you exit the elevator.  Follow up with Dr. Meridee Score 02/05/23 @ 10:30 am.  _______________________________________________________  If your blood pressure at your visit was 140/90 or greater, please contact your primary care physician to follow up on this.  _______________________________________________________  If you are age 42 or older, your body mass index should be between 23-30. Your Body mass index is 22.07 kg/m. If this is out of the aforementioned range listed, please consider follow up with your Primary Care Provider.  If you are age 85 or younger, your body mass index should be between 19-25. Your Body mass index is 22.07 kg/m. If this is out of the aformentioned range listed, please consider follow up with your Primary Care Provider.   ________________________________________________________  The [facility] providers would like to encourage you to use [facility] to communicate with providers for non-urgent requests or questions.  Due to long hold times on the telephone, sending your provider a message by [facility] may be a faster and more efficient way to get a response.  Please allow 48 business hours for a response.  Please remember that this is for non-urgent requests.  _______________________________________________________

## 2023-01-01 LAB — CEA: CEA: 230.4 ng/mL — ABNORMAL HIGH

## 2023-01-01 LAB — CA 19-9 (SERIAL): CA 19-9: <2 U/mL (ref 0–35)

## 2023-01-04 NOTE — Progress Notes (Signed)
Due to patient's clinical status from a pulmonary perspective, and question of lung malignancy and recent pneumonia and my availability, I cannot unfortunately accommodate an EUS in a timely manner.  CA19-9 is normal, making [name] think this could less likely be a pancreatic malignancy however.  CEA elevation is too much for typical pancreatic adenocarcinomas.  Unfortunately, will need referral to quaternary center in this instance.

## 2023-01-05 ENCOUNTER — Other Ambulatory Visit: Payer: Self-pay | Admitting: *Deleted

## 2023-01-05 ENCOUNTER — Telehealth: Payer: Self-pay | Admitting: *Deleted

## 2023-01-05 DIAGNOSIS — C3412 Malignant neoplasm of upper lobe, left bronchus or lung: Secondary | ICD-10-CM

## 2023-01-05 DIAGNOSIS — R918 Other nonspecific abnormal finding of lung field: Secondary | ICD-10-CM

## 2023-01-05 DIAGNOSIS — K869 Disease of pancreas, unspecified: Secondary | ICD-10-CM

## 2023-01-05 NOTE — Telephone Encounter (Signed)
Called patient to inform of the patient that we can not get her in for a pancreatic dx in a timely fashion and patient will need to to be referred to [facility] due to an abnormal imaging of the pancreas and pancreatic biopsy via EUS. Faxed patient's information to [employer]. Patient is not available to speak to on the phone, left message in detail as per her DPR in chart. Also informed the patient if she is not in agreement with referral she can notify both [facility] and [facility] once they call for the scheduled appt. Otherwise the referral is being made at this time. Information sent to [facility] fax number at [phone].

## 2023-01-05 NOTE — Telephone Encounter (Signed)
-----   Message from Unk Lightning sent at 01/05/2023  8:51 AM EST ----- Regarding: Referral to [facility] Please let patient know we can not get her in for pancreatic bx in a timely fashion , we will need to refer her to [facility].  Please refer her to [facility] for abnormal imaging of pancreas and pancreatic biopsy via EUS.  Thanks-JLL ----- Message ----- From: Meryl Dare, MD Sent: 01/05/2023   8:39 AM EST To: Unk Lightning, PA  Please refer to [facility] for EUS. Thanks ----- Message ----- From: Lemar Lofty., MD Sent: 01/04/2023   4:47 PM EST To: Meryl Dare, MD; Unk Lightning, [place]  Team, Unfortunately no availability until January and then with my family issues this may get pushed out further. I am not sure why a CEA is so elevated-is she up-to-date for colon cancer screening? Pancreas malignancies do not cause CEA elevations in typical fashion. I think that IR and pulmonary were waiting on her pneumonia to get better, but I am not sure she will have that time. You will need to refer her out unfortunately. GM ----- Message ----- From: Meryl Dare, MD Sent: 01/04/2023   9:08 AM EST To: Unk Lightning, PA; #  Reviewed and agree that EUS of the pancreatic cyst is the next step. If Dr. Meridee Score does not have availability for EUS then would proceed with referral to [facility]. ----- Message ----- From: Unk Lightning, PA Sent: 01/04/2023   8:52 AM EST To: Meryl Dare, MD; #  This is the complicated patient I saw last Wednesday.  Please let me know if you have any additional thoughts.  Dr. Meridee Score you had mentioned referring her out for pancreatic biopsy, please let me know if this is necessary.  Thanks, JL L

## 2023-01-06 ENCOUNTER — Ambulatory Visit (INDEPENDENT_AMBULATORY_CARE_PROVIDER_SITE_OTHER): Payer: Medicare Other | Admitting: Nurse Practitioner

## 2023-01-06 ENCOUNTER — Encounter: Payer: Self-pay | Admitting: Nurse Practitioner

## 2023-01-06 ENCOUNTER — Ambulatory Visit (HOSPITAL_COMMUNITY)
Admission: RE | Admit: 2023-01-06 | Discharge: 2023-01-06 | Disposition: A | Discharge status: Home / Self Care | Payer: Medicare Other | Source: Ambulatory Visit | Attending: Internal Medicine | Admitting: Internal Medicine

## 2023-01-06 VITALS — BP 80/64 | HR 72 | Ht 59.0 in | Wt 109.0 lb

## 2023-01-06 VITALS — BP 99/67 | HR 72 | Temp 98.0°F | Resp 14 | Ht 59.0 in | Wt 108.0 lb

## 2023-01-06 DIAGNOSIS — I959 Hypotension, unspecified: Secondary | ICD-10-CM | POA: Insufficient documentation

## 2023-01-06 DIAGNOSIS — I4892 Unspecified atrial flutter: Secondary | ICD-10-CM | POA: Diagnosis not present

## 2023-01-06 DIAGNOSIS — E1143 Type 2 diabetes mellitus with diabetic autonomic (poly)neuropathy: Secondary | ICD-10-CM | POA: Diagnosis not present

## 2023-01-06 DIAGNOSIS — E1122 Type 2 diabetes mellitus with diabetic chronic kidney disease: Secondary | ICD-10-CM | POA: Diagnosis not present

## 2023-01-06 DIAGNOSIS — Z5181 Encounter for therapeutic drug level monitoring: Secondary | ICD-10-CM

## 2023-01-06 DIAGNOSIS — I4891 Unspecified atrial fibrillation: Secondary | ICD-10-CM | POA: Insufficient documentation

## 2023-01-06 DIAGNOSIS — R9431 Abnormal electrocardiogram [ECG] [EKG]: Secondary | ICD-10-CM | POA: Insufficient documentation

## 2023-01-06 DIAGNOSIS — N186 End stage renal disease: Secondary | ICD-10-CM | POA: Diagnosis not present

## 2023-01-06 DIAGNOSIS — R0789 Other chest pain: Secondary | ICD-10-CM

## 2023-01-06 DIAGNOSIS — Z79899 Other long term (current) drug therapy: Secondary | ICD-10-CM | POA: Insufficient documentation

## 2023-01-06 LAB — COMPREHENSIVE METABOLIC PANEL
ALT: 9 U/L (ref 0–44)
AST: 18 U/L (ref 15–41)
Albumin: 3.2 g/dL — ABNORMAL LOW (ref 3.5–5.0)
Alkaline Phosphatase: 144 U/L — ABNORMAL HIGH (ref 38–126)
Anion gap: 15 (ref 5–15)
BUN: 15 mg/dL (ref 6–20)
CO2: 25 mmol/L (ref 22–32)
Calcium: 9.6 mg/dL (ref 8.9–10.3)
Chloride: 92 mmol/L — ABNORMAL LOW (ref 98–111)
Creatinine, Ser: 6.02 mg/dL — ABNORMAL HIGH (ref 0.44–1.00)
GFR, Estimated: 8 mL/min — ABNORMAL LOW (ref >60)
Glucose, Bld: 88 mg/dL (ref 70–99)
Potassium: 3.9 mmol/L (ref 3.5–5.1)
Sodium: 132 mmol/L — ABNORMAL LOW (ref 135–145)
Total Bilirubin: 0.4 mg/dL (ref <1.2)
Total Protein: 6.5 g/dL (ref 6.5–8.1)

## 2023-01-06 LAB — TSH: TSH: 8.385 u[IU]/mL — ABNORMAL HIGH (ref 0.350–4.500)

## 2023-01-06 MED ORDER — PREDNISONE 20 MG PO TABS: 20.0000 mg | ORAL_TABLET | Freq: Every day | ORAL | 0 refills | Status: AC

## 2023-01-06 NOTE — Progress Notes (Signed)
Primary Care Physician: Ivonne Andrew, NP Referring Physician: AHF AHF MD: Dr. Alphonse Guild is a 57 y.o. female with a h/o ESRD s/p failed renal transplant now on HD TTS, orthostatic hypotension, chronic diastolic CHF, moderate pHTN, HTN and gout brought to ED from outpatient HD with rapid A.flutter and fever.  Reportedly had fever with heart rates in 150s after 1 hour of HD session.  She had 20 mg IV Cardizem push.  RVR resolved but she became hypotensive.  In ED, febrile to 100.5.  No leukocytosis.  UA, CXR and CT abdomen and pelvis without significant finding.  She was hypotensive but blood pressure improved with 500 cc bolus.  She was in sinus rhythm.  Blood cultures obtained.  She was a started on broad-spectrum antibiotics for SIRS.  The next day, blood pressure improved.  Remained in sinus rhythm.  Blood cultures NGTD. TTE without significant finding.  Cleared by nephrology for discharge.  She felt well and asked to go home.  Nephrology to follow-up on further update of her blood culture and address accordingly.  She was seen in AHF clinc, 04/03/20. It was noted that pt's monitor showed less thatn 1% aflutter  burden and was referred here to see if antiarrythmic's were indicated. Ekg today shows NSR. Pt denies having any tachy arrhythmia's. She has not started eliquis as she was waiting  to have 5 teeth extractions end of April. She has a CHA2DS2VASc score of 6 with a previous stroke. She states that when she had Covid last June, that it caused her to lose her kidney transplant that she had for 16 years done in the [place] area. Hence she had to start back on dialysis. She was started on amiodarone 04/23/20 for rapid a flutter occurring with dialysis, dose ws reduced to 100 mg daily at a later date, for mild elevation of TSH.  F/u in afib clinic, 09/18/20. She had Covid  in June with Pneumonia, She states that she recovered quickly.  EKG today shows  SR. SHe has noted very  little rapid heart rate . Will usually occur at the tail end of dialysis, of very short duration.   F/u in afib clinic, 03/26/21.she has not noted any afib. Continues on amiodarone 200 mg daily. Has dialysis yesterday. Has noted chest congestion x 3-4 days. No fever. Productive cough. Rhonchi heard throughout both lungs. Continues to smoke 1/2 pack a day. PO 97%.   F/u in afib clinic, 10/01/21. She was recently in the hospital early August for N/V associated with gastroparesis. This was acute on chronic. N/V improved while in the hospital but he had a spell of N/V yesterday. At times, she misses her meds 2/2 N/V. She is in SR, no afib episodes to report.   F/u in afib clinic, 07/01/22. She is currently in NSR. No new recent episodes of Afib. She is compliant with amiodarone and anticoagulant.  F/u in Afib clinic, 01/06/23. She is currently in NSR. Review of notes show recent diagnosis of lung malignancy. Patient is going to be stopping smoking soon. No recent episodes of Afib. She is compliant with amiodarone and anticoagulant. Her dialysis schedule is Tuesday, Thursday, and Saturday. She notes overall infrequent Afib burden.   Today, she denies symptoms of palpitations, chest pain, shortness of breath, orthopnea, PND, lower extremity edema, dizziness, presyncope, syncope, or neurologic sequela. The patient is tolerating medications without difficulties and is otherwise without complaint today.   Past Medical History:  Diagnosis  Date   Anxiety    Bacteremia due to Gram-negative bacteria 05/23/2011   Blind    right eye   Blind right eye    CHF (congestive heart failure) (HCC)    Chronic lower back pain    Complication of anesthesia    "woke up during OR; I have an extremely high tolerance" (12/11/2011) 1 procedure was graft; the other procedures were procedures that are typically done with sedation.   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   Dysrhythmia    "tachycardia" (12/11/2011) new onset afib 10/15/14 EKG   E coli bacteremia 06/18/2011   Elevated LDL cholesterol level 12/2018   ESRD (end stage renal disease) (HCC) 06/12/2011   Tues-Thurs-Sat dialysis   Fibromyalgia    Gastroparesis    GERD (gastroesophageal reflux disease)    Glaucoma    right eye   Gout    H/O carpal tunnel syndrome    Headache 10/2019   no current problem per patient on 11/12/22   Headache(784.0)    "not often anymore" (12/11/2011)   Herpes genitalia 1994   History of blood transfusion    "more than a few times" (12/11/2011)   History of COVID-19, most recently 09/2022 05/09/2019   History of deep vein thrombosis (DVT) of right lower extremity 11/20/2017   History of stomach ulcers    Hypotension    Iron deficiency anemia    New onset a-fib (HCC)    10/15/14 EKG   Osteopenia    Peripheral neuropathy 11/2018   Pneumonia    x several but years ago   Pressure ulcer of sacral region, stage 1 07/2019   Seizures (HCC) 1994   "post transplant; only have had that one" (12/11/2011)   Spinal stenosis in cervical region    Stroke [facility])     left basal ganglia lacunar infarct; Right frontal lobe lacunar infarct.   Stroke [facility]) ~ 1999; 2001   "briefly lost my vision; lost my right eye" (12/11/2011)   Vitamin D deficiency 12/2018   Past Surgical History:  Procedure Laterality Date   ANTERIOR CERVICAL DECOMP/DISCECTOMY FUSION N/A 01/08/2015   Procedure: Anterior Cervical Three-Four/Four-Five Decompression/Diskectomy/Fusion;  Surgeon: Hilda Lias, MD;  Location: [facility] NEURO ORS;  Service: Neurosurgery;  Laterality: N/A;  C3-4 C4-5 Anterior cervical decompression/diskectomy/fusion   APPENDECTOMY  ~ 2004   BIOPSY  09/12/2020   Procedure: BIOPSY;  Surgeon: Rachael Fee, MD;  Location: [facility] ENDOSCOPY;  Service: Endoscopy;;   BRONCHIAL BIOPSY  11/16/2022   Procedure: BRONCHIAL BIOPSIES;  Surgeon: Leslye Peer, MD;  Location: [facility] ENDOSCOPY;   Service: Pulmonary;;   BRONCHIAL BRUSHINGS  11/16/2022   Procedure: BRONCHIAL BRUSHINGS;  Surgeon: Leslye Peer, MD;  Location: [facility] ENDOSCOPY;  Service: Pulmonary;;   BRONCHIAL NEEDLE ASPIRATION BIOPSY  11/16/2022   Procedure: BRONCHIAL NEEDLE ASPIRATION BIOPSIES;  Surgeon: Leslye Peer, MD;  Location: [facility] ENDOSCOPY;  Service: Pulmonary;;   CATARACT EXTRACTION     right eye   COLONOSCOPY     COLONOSCOPY WITH PROPOFOL N/A 09/12/2020   Procedure: COLONOSCOPY WITH PROPOFOL;  Surgeon: Rachael Fee, MD;  Location: [facility] ENDOSCOPY;  Service: Endoscopy;  Laterality: N/A;   ENUCLEATION  2001   "right"   ESOPHAGOGASTRODUODENOSCOPY (EGD) WITH PROPOFOL N/A 04/21/2012   Procedure: ESOPHAGOGASTRODUODENOSCOPY (EGD) WITH PROPOFOL;  Surgeon: Rachael Fee, MD;  Location: [facility] ENDOSCOPY;  Service: Endoscopy;  Laterality: N/A;   ESOPHAGOGASTRODUODENOSCOPY (EGD) WITH PROPOFOL N/A 09/12/2020  Procedure: ESOPHAGOGASTRODUODENOSCOPY (EGD) WITH PROPOFOL;  Surgeon: Rachael Fee, MD;  Location: [facility] ENDOSCOPY;  Service: Endoscopy;  Laterality: N/A;   FIDUCIAL MARKER PLACEMENT  11/16/2022   Procedure: FIDUCIAL MARKER PLACEMENT;  Surgeon: Leslye Peer, MD;  Location[facility] ENDOSCOPY;  Service: Pulmonary;;   HEMOSTASIS CONTROL  11/16/2022   Procedure: HEMOSTASIS CONTROL;  Surgeon: Leslye Peer, MD;  Location: [facility] ENDOSCOPY;  Service: Pulmonary;;   INSERTION OF DIALYSIS CATHETER  1988   "AV graft LUA & LFA; LUA worked for 1 day; LFA never worked"   IR FLUORO GUIDE CV LINE RIGHT  07/11/2019   IR FLUORO GUIDE CV LINE RIGHT  10/20/2019   IR FLUORO GUIDE CV LINE RIGHT  05/31/2020   IR PTA VENOUS EXCEPT DIALYSIS CIRCUIT  05/31/2020   IR RADIOLOGY PERIPHERAL GUIDED IV START  07/11/2019   IR US GUIDE VASC ACCESS RIGHT  07/11/2019   IR US GUIDE VASC ACCESS RIGHT  07/11/2019   IR US GUIDE VASC ACCESS RIGHT  07/11/2019   IR VENOCAVAGRAM IVC  05/31/2020   KIDNEY TRANSPLANT  1994; 1999; 2005   "right"   MULTIPLE TOOTH EXTRACTIONS      POLYPECTOMY  09/12/2020   Procedure: POLYPECTOMY;  Surgeon: Rachael Fee, MD;  Location: [facility] ENDOSCOPY;  Service: Endoscopy;;   RIGHT HEART CATH N/A 03/23/2019   Procedure: RIGHT HEART CATH;  Surgeon: Dolores Patty, MD;  Location: [facility] INVASIVE CV LAB;  Service: Cardiovascular;  Laterality: N/A;   TONSILLECTOMY     TOTAL NEPHRECTOMY  1988?; 1994; 2005   VIDEO BRONCHOSCOPY WITH RADIAL ENDOBRONCHIAL ULTRASOUND  11/16/2022   Procedure: VIDEO BRONCHOSCOPY WITH RADIAL ENDOBRONCHIAL ULTRASOUND;  Surgeon: Leslye Peer, MD;  Location: [facility] ENDOSCOPY;  Service: Pulmonary;;    Current Outpatient Medications  Medication Sig Dispense Refill   allopurinol (ZYLOPRIM) 100 MG tablet Take 0.5 tablets (50 mg total) by mouth 2 (two) times a week. (Patient taking differently: Take 100 mg by mouth daily in the afternoon.) 90 tablet 3   amiodarone (PACERONE) 200 MG tablet Take 0.5 tablets (100 mg total) by mouth daily. 90 tablet 1   apixaban (ELIQUIS) 2.5 MG TABS tablet Take 1 tablet (2.5 mg total) by mouth 2 (two) times daily. Okay to restart this medication on 11/18/2022     Colchicine 0.6 MG CAPS Take 0.6 mg by mouth daily as needed (gout flare up). 30 capsule 2   cyclobenzaprine (FLEXERIL) 5 MG tablet Take 1 tablet (5 mg total) by mouth at bedtime as needed. 90 tablet 1   diclofenac Sodium (VOLTAREN) 1 % GEL Apply 4 g topically as needed. 50 g 0   diphenhydrAMINE (BENADRYL) 25 mg capsule Take 1 capsule (25 mg total) by mouth every 8 (eight) hours as needed. 30 capsule 6   dorzolamide-timolol (COSOPT) 2-0.5 % ophthalmic solution Place 1 drop into the left eye 2 (two) times daily.     escitalopram (LEXAPRO) 20 MG tablet Take 20 mg by mouth daily.     famotidine (PEPCID) 20 MG tablet TAKE 1 TABLET BY MOUTH TWICE A DAY 180 tablet 3   fluticasone-salmeterol (ADVAIR HFA) 115-21 MCG/ACT inhaler Inhale 2 puffs into the lungs 2 (two) times daily. 1 each 12   gabapentin (NEURONTIN) 100 MG capsule Take 100 mg by  mouth 3 (three) times daily as needed (nerve pain).     heparin sodium, porcine, 1000 UNIT/ML injection Inject 1,000 Units into the peritoneum once.     iron sucrose (VENOFER) 20 MG/ML injection Inject 100  mg into the vein one time in dialysis.     lidocaine (LIDODERM) 5 % Place 1 patch onto the skin daily as needed (for pain- remove old patch first).      metoCLOPramide (REGLAN) 10 MG tablet Take 1 tablet (10 mg total) by mouth 3 (three) times daily before meals. (Patient taking differently: Take 10 mg by mouth daily in the afternoon.) 90 tablet 2   midodrine (PROAMATINE) 10 MG tablet Take 1.5 tablets (15 mg total) by mouth 3 (three) times daily. NEEDS FOLLOW UP APPOINTMENT FOR ANYMORE REFILLS 405 tablet 0   omeprazole (PRILOSEC) 40 MG capsule TAKE 1 CAPSULE (40 MG TOTAL) BY MOUTH DAILY. 90 capsule 1   oxyCODONE-acetaminophen (PERCOCET) 10-325 MG tablet Take 1 tablet by mouth 4 (four) times daily as needed for pain.     VELPHORO 500 MG chewable tablet Taking 500 mg by mouth per meal or snack     zolpidem (AMBIEN) 10 MG tablet Take 1 tablet (10 mg total) by mouth at bedtime as needed for sleep. 10 tablet 0   No current facility-administered medications for this encounter.    Allergies  Allergen Reactions   Levofloxacin Itching and Rash   Tape Other (See Comments)    "Certain surgical tapes peel off my skin"   ROS- All systems are reviewed and negative except as per the HPI above  Physical Exam: Vitals:   01/06/23 0939  BP: (!) 80/64  Pulse: 72  Weight: 49.4 kg  Height: 4\' 11"  (1.499 m)    Wt Readings from Last 3 Encounters:  01/06/23 49.4 kg  12/30/22 49.6 kg  12/24/22 48.5 kg    Labs: Lab Results  Component Value Date   NA 132 (L) 12/24/2022   K 3.3 (L) 12/24/2022   CL 93 (L) 12/24/2022   CO2 24 12/24/2022   GLUCOSE 81 12/24/2022   BUN 22 (H) 12/24/2022   CREATININE 8.21 (H) 12/24/2022   CALCIUM 9.0 12/24/2022   PHOS 5.5 (H) 12/24/2022   MG 1.7 06/14/2011   Lab  Results  Component Value Date   INR 1.3 (H) 12/22/2022   Lab Results  Component Value Date   CHOL 202 (H) 07/14/2021   HDL 113 07/14/2021   LDLCALC 79 07/14/2021   TRIG 51 07/14/2021   GEN- The patient is well appearing, alert and oriented x 3 today.   Neck - no JVD or carotid bruit noted Lungs- Clear to ausculation bilaterally, normal work of breathing Heart- Regular rate and rhythm, no murmurs, rubs or gallops, PMI not laterally displaced Extremities- no clubbing, cyanosis, or edema Skin - no rash or ecchymosis noted   EKG-   Vent. rate 72 BPM PR interval 202 ms QRS duration 76 ms QT/QTcB 492/538 ms P-R-T axes 69 39 117 Normal sinus rhythm Lateral infarct , age undetermined Inferior infarct , age undetermined Prolonged QT Abnormal ECG When compared with ECG of 18-Dec-2022 09:08, PREVIOUS ECG IS PRESENT  Echo 03/29/20- 1. Left ventricular ejection fraction, by estimation, is 65 to 70%. The  left ventricle has normal function. The left ventricle has no regional  wall motion abnormalities. Left ventricular diastolic parameters were  normal.   2. Right ventricular systolic function is normal. The right ventricular  size is normal. There is normal pulmonary artery systolic pressure.   3. The mitral valve is normal in structure. No evidence of mitral valve  regurgitation. No evidence of mitral stenosis.   4. The aortic valve is normal in structure. Aortic valve regurgitation  is  not visualized. No aortic stenosis is present.   5. The inferior vena cava is normal in size with greater than 50%  respiratory variability, suggesting right atrial pressure of 3 mmHg.   Zio patch-Patch Wear Time:  14 days and 0 hours (2022-02-02T11:43:21-0500 to 2022-02-16T11:43:25-0500)   1. Sinus rhythm - avg HR of 78 bpm. 2. Atrial Flutter occurred (<1% burden), ranging from 113-208 bpm (avg of 148 bpm), the longest lasting 1 hour 16 mins with an avg rate  of 139 bpm. Atrial Flutter was  detected within +/- 45 seconds of symptomatic patient event(s).  3. Rare PACs and PVCs.   Stress test- 04/12/20-Study Highlights    The left ventricular ejection fraction is hyperdynamic (>65%). Nuclear stress EF: 75%. There was no ST segment deviation noted during stress. Defect 1: There is a medium defect of mild severity present in the mid anterior location. The study is normal. This is a low risk study.  Assessment and Plan: 1. Atrial flutter  Doing well staying in rhythm with daily amiodarone. Continue amiodarone 200 mg daily.  Amiodarone is her only choice of drug 2/2 ESRD and HD Cmet and TSH drawn today.  Prolonged QT interval today and noted previously in other ECGs in the system. Discussed with patient and recommended to speak to PCP to transition from Lexapro to another mood stabilizer that does not prolong the QT interval / interact with amiodarone. I explained the importance of this to avoid arrhythmias and also that there are no other options for anti arrhythmic drug therapy.   2. CHA2DS2VASc score of 6 Eliquis 2.5 mg bid for weight less than 60 KG and ESRD  3. Hypotension  Stable, not symptomatic   F/u in afib clinic in 6 months.  Lake Bells, PA-C Afib Clinic [facility] [address] [place], [place] [address] [phone]

## 2023-01-06 NOTE — Patient Instructions (Addendum)
1. Chest tightness  - predniSONE (DELTASONE) 20 MG tablet; Take 1 tablet (20 mg total) by mouth daily with breakfast for 5 days.  Dispense: 5 tablet; Refill: 0   Follow up:  Follow up in 3 months

## 2023-01-06 NOTE — Telephone Encounter (Signed)
Received call from patient this am and was informed that she wants to attend [facility] due to the distance required to have to travel to [facility] and she would prefer to stay closer to home. Ms. Zerita Boers, [place] has been notified and referral for [facility] has been initiated at patient's request. Patient's information has been faxed to [facility]. Called the [phone] phone number and spoke with [facility]. Patient's information sent to the fax number given at [phone]; due to abnormal pancrease imaging and pancreatic biopsy via EUS.

## 2023-01-06 NOTE — Progress Notes (Signed)
Subjective   Patient ID: Tina Watkins, female    DOB: 10-06-1965, 57 y.o.   MRN: [id number]  Chief Complaint  Patient presents with   Hospitalization Follow-up    Referring provider: Ivonne Andrew, NP  Tina Watkins is a 57 y.o. female with Past Medical History: No date: Anxiety 05/23/2011: Bacteremia due to Gram-negative bacteria No date: Blind     Comment:  right eye No date: Blind right eye No date: CHF (congestive heart failure) (HCC) No date: Chronic lower back pain No date: Complication of anesthesia     Comment:  "woke up during OR; I have an extremely high tolerance"               (12/11/2011) 1 procedure was graft; the other procedures               were procedures that are typically done with sedation. No date: DDD (degenerative disc disease), cervical No date: Depression 12/24/2022: Diabetic osteomyelitis (HCC) No date: Dysrhythmia     Comment:  "tachycardia" (12/11/2011) new onset afib 10/15/14 EKG 06/18/2011: E coli bacteremia 12/2018: Elevated LDL cholesterol level 06/12/2011: ESRD (end stage renal disease) (HCC)     Comment:  Tues-Thurs-Sat dialysis No date: Fibromyalgia No date: Gastroparesis No date: GERD (gastroesophageal reflux disease) No date: Glaucoma     Comment:  right eye No date: Gout No date: H/O carpal tunnel syndrome 10/2019: Headache     Comment:  no current problem per patient on 11/12/22 No date: Headache(784.0)     Comment:  "not often anymore" (12/11/2011) 1994: Herpes genitalia No date: History of blood transfusion     Comment:  "more than a few times" (12/11/2011) 05/09/2019: History of COVID-19, most recently 09/2022 11/20/2017: History of deep vein thrombosis (DVT) of right lower  extremity No date: History of stomach ulcers No date: Hypotension No date: Iron deficiency anemia No date: New onset a-fib [facility])     Comment:  10/15/14 EKG No date: Osteopenia 11/2018: Peripheral neuropathy No date: Pneumonia     Comment:   x several but years ago 07/2019: Pressure ulcer of sacral region, stage 1 1994: Seizures (HCC)     Comment:  "post transplant; only have had that one" (12/11/2011) No date: Spinal stenosis in cervical region No date: Stroke [facility])     Comment:   left basal ganglia lacunar infarct; Right frontal lobe               lacunar infarct. ~ 1999; 2001: Stroke [facility])     Comment:  "briefly lost my vision; lost my right eye" (12/11/2011) 12/2018: Vitamin D deficiency  Recent significant events:  Hospital admission: 12/18/22  Hospital Summary:  Acute hypoxic respiratory failure/postobstructive pneumonia/left-sided pleural effusion/malignant nodule of the left upper lobe: Patient had left thoracentesis on 11/17, cytology did not show any malignant cells.  Patient was treated with Zosyn 6 days, doxycycline 3 days, 1 dose of Augmentin.  Patient was followed by oncology and pulmonology inpatient, oncology plans to see the patient outpatient once the lab results of the biopsies are back. She is otherwise stable.   Liver cirrhosis/pancreatic tail cystic lesion: Please follow-up on mitochondrial antibody, please order CEA and Cancer antigen 19-9 so the oncology can follow up on it.  An appointment with [facility] is scheduled on November 27 at 11:30 PM so  they can follow-up on the pancreatic tail cystic lesion and liver cirrhosis. Acute gout flare: Take allopurinol 100 mg, half a tablet by mouth  2 times a week to prevent future gout flare. ESRD on hemodialysis: She has received 3 sessions of dialysis here inpatient. A flutter: Continue home medication amiodarone 100 mg daily and Eliquis 2.5 mg twice daily COPD: Continue Breo Ellipta 2 puffs twice daily Glaucoma: COPOST eyedrops daily Chronic pain: Continue home medication Percocet 10 325 mg every 4 hours, Flexeril 5 mg as needed, Voltaren, gabapentin 100 mg 3 times daily, lidocaine patch as needed Gastroparesis/GERD: Continue home medication Reglan 10 mg in the  morning, Pepcid 10 mg twice daily, omeprazole 40 mg daily, Zofran 8 mg daily Insomnia: Continue home medication Ambien 5 mg daily   HPI  Patient presents today for a hospital follow-up.  Please see notes above.  Overall patient is stable since hospital discharge.  She has already followed with the GI specialist and is planning on having an upcoming pancreatic biopsy.  Patient does have an appointment scheduled next week with oncology as well.  She does already have an appointment scheduled with pulmonary as well but this is not until February.  Patient does complain today of some chest tightness but states that she has been having some right shoulder pain as well.  We will trial a round of prednisone.  We will have patient follow-up in 4 weeks to reevaluate and follow-up from recent pneumonia.  Patient does have dialysis on Tuesdays Thursdays and Saturdays.  She did have recent blood work. Denies f/c/s, n/v/d, hemoptysis, PND, leg swelling Denies chest pain or edema     Allergies  Allergen Reactions   Levofloxacin Itching and Rash   Tape Other (See Comments)    "Certain surgical tapes peel off my skin"    Immunization History  Administered Date(s) Administered   Hepatitis B, PED/ADOLESCENT 07/20/2019, 08/29/2019, 09/21/2019   Hepb-cpg 07/20/2019, 08/29/2019, 09/21/2019, 11/23/2019, 08/13/2022   Influenza Split 11/07/2019   Influenza, Quadrivalent, Recombinant, Inj, Pf 11/11/2021   Influenza,inj,Quad PF,6+ Mos 11/07/2019, 11/07/2020   PFIZER Comirnaty(Gray Top)Covid-19 Tri-Sucrose Vaccine 07/29/2020   PFIZER(Purple Top)SARS-COV-2 Vaccination 05/31/2019, 06/21/2019, 10/26/2019   PNEUMOCOCCAL CONJUGATE-20 06/04/2022   PPD Test 08/10/2017   Pfizer Covid-19 Vaccine Bivalent Booster 10yrs & up 02/06/2021    Tobacco History: Social History   Tobacco Use  Smoking Status Every Day   Current packs/day: 1.50   Average packs/day: 1.5 packs/day for 35.0 years (52.5 ttl pk-yrs)   Types:  Cigarettes   Passive exposure: Never  Smokeless Tobacco Never  Tobacco Comments   Pack of cigarettes lasts 2 days 09/18/2020   Patient now only smokes 5 cigarettes per day as of 11/12/22   Ready to quit: Not Answered Counseling given: Not Answered Tobacco comments: Pack of cigarettes lasts 2 days 09/18/2020 Patient now only smokes 5 cigarettes per day as of 11/12/22   Outpatient Encounter Medications as of 01/06/2023  Medication Sig   allopurinol (ZYLOPRIM) 100 MG tablet Take 0.5 tablets (50 mg total) by mouth 2 (two) times a week. (Patient taking differently: Take 100 mg by mouth daily in the afternoon.)   amiodarone (PACERONE) 200 MG tablet Take 0.5 tablets (100 mg total) by mouth daily.   apixaban (ELIQUIS) 2.5 MG TABS tablet Take 1 tablet (2.5 mg total) by mouth 2 (two) times daily. Okay to restart this medication on 11/18/2022   Colchicine 0.6 MG CAPS Take 0.6 mg by mouth daily as needed (gout flare up).   cyclobenzaprine (FLEXERIL) 5 MG tablet Take 1 tablet (5 mg total) by mouth at bedtime as needed.   diclofenac Sodium (VOLTAREN) 1 %  GEL Apply 4 g topically as needed.   diphenhydrAMINE (BENADRYL) 25 mg capsule Take 1 capsule (25 mg total) by mouth every 8 (eight) hours as needed.   dorzolamide-timolol (COSOPT) 2-0.5 % ophthalmic solution Place 1 drop into the left eye 2 (two) times daily.   escitalopram (LEXAPRO) 20 MG tablet Take 20 mg by mouth daily.   famotidine (PEPCID) 20 MG tablet TAKE 1 TABLET BY MOUTH TWICE A DAY   fluticasone-salmeterol (ADVAIR HFA) 115-21 MCG/ACT inhaler Inhale 2 puffs into the lungs 2 (two) times daily.   gabapentin (NEURONTIN) 100 MG capsule Take 100 mg by mouth 3 (three) times daily as needed (nerve pain).   heparin sodium, porcine, 1000 UNIT/ML injection Inject 1,000 Units into the peritoneum once.   iron sucrose (VENOFER) 20 MG/ML injection Inject 100 mg into the vein one time in dialysis.   lidocaine (LIDODERM) 5 % Place 1 patch onto the skin daily  as needed (for pain- remove old patch first).    metoCLOPramide (REGLAN) 10 MG tablet Take 1 tablet (10 mg total) by mouth 3 (three) times daily before meals. (Patient taking differently: Take 10 mg by mouth daily in the afternoon.)   midodrine (PROAMATINE) 10 MG tablet Take 1.5 tablets (15 mg total) by mouth 3 (three) times daily. NEEDS FOLLOW UP APPOINTMENT FOR ANYMORE REFILLS   omeprazole (PRILOSEC) 40 MG capsule TAKE 1 CAPSULE (40 MG TOTAL) BY MOUTH DAILY.   oxyCODONE-acetaminophen (PERCOCET) 10-325 MG tablet Take 1 tablet by mouth 4 (four) times daily as needed for pain.   predniSONE (DELTASONE) 20 MG tablet Take 1 tablet (20 mg total) by mouth daily with breakfast for 5 days.   VELPHORO 500 MG chewable tablet Taking 500 mg by mouth per meal or snack   zolpidem (AMBIEN) 10 MG tablet Take 1 tablet (10 mg total) by mouth at bedtime as needed for sleep.   No facility-administered encounter medications on file as of 01/06/2023.    Review of Systems  Review of Systems  Constitutional: Negative.   HENT: Negative.    Cardiovascular: Negative.   Gastrointestinal: Negative.   Allergic/Immunologic: Negative.   Neurological: Negative.   Psychiatric/Behavioral: Negative.       Objective:   BP 99/67 (BP Location: Left Arm, Patient Position: Sitting, Cuff Size: Normal)   Pulse 72   Temp 98 F (36.7 C)   Resp 14   Ht 4\' 11"  (1.499 m)   Wt 108 lb (49 kg)   SpO2 100%   BMI 21.81 kg/m   Wt Readings from Last 5 Encounters:  01/06/23 108 lb (49 kg)  01/06/23 109 lb (49.4 kg)  12/30/22 109 lb 4 oz (49.6 kg)  12/24/22 106 lb 14.8 oz (48.5 kg)  12/02/22 110 lb (49.9 kg)     Physical Exam Vitals and nursing note reviewed.  Constitutional:      General: She is not in acute distress.    Appearance: She is well-developed.  Cardiovascular:     Rate and Rhythm: Normal rate and regular rhythm.  Pulmonary:     Effort: Pulmonary effort is normal.     Breath sounds: Normal breath sounds.   Neurological:     Mental Status: She is alert and oriented to person, place, and time.       Assessment & Plan:   Chest tightness -     predniSONE; Take 1 tablet (20 mg total) by mouth daily with breakfast for 5 days.  Dispense: 5 tablet; Refill: 0  Return in about 3 months (around 04/06/2023).     Ivonne Andrew, NP 01/06/2023

## 2023-01-07 ENCOUNTER — Other Ambulatory Visit: Payer: Self-pay

## 2023-01-08 ENCOUNTER — Telehealth: Payer: Self-pay | Admitting: Nurse Practitioner

## 2023-01-08 ENCOUNTER — Telehealth (INDEPENDENT_AMBULATORY_CARE_PROVIDER_SITE_OTHER): Payer: Medicare Other | Admitting: Nurse Practitioner

## 2023-01-08 ENCOUNTER — Encounter: Payer: Self-pay | Admitting: Nurse Practitioner

## 2023-01-08 DIAGNOSIS — R7989 Other specified abnormal findings of blood chemistry: Secondary | ICD-10-CM | POA: Diagnosis not present

## 2023-01-08 DIAGNOSIS — F419 Anxiety disorder, unspecified: Secondary | ICD-10-CM

## 2023-01-08 MED ORDER — SERTRALINE HCL 25 MG PO TABS: 25.0000 mg | ORAL_TABLET | Freq: Every day | ORAL | 3 refills | Status: DC

## 2023-01-08 NOTE — Progress Notes (Signed)
Virtual Visit via Telephone Note  I connected with [name] on 01/08/23 at  1:20 PM EST by telephone and verified that I am speaking with the correct person using two identifiers.  Location: Patient: home Provider: office   I discussed the limitations, risks, security and privacy concerns of performing an evaluation and management service by telephone and the availability of in person appointments. I also discussed with the patient that there may be a patient responsible charge related to this service. The patient expressed understanding and agreed to proceed.   History of Present Illness:  Patient presents today for telephone visit.  Patient was recently seen by cardiology and was advised to stop Lexapro due to EKG changes associated with amiodarone.  We will discuss medication change with pharmacy and switch her to a different antidepressant.  Patient is prescribed this by her psychiatrist.  She will follow-up with them as well.  Patient will need to return to the office next week for a lab visit to have thyroid rechecked as well.  Recent levels by cardiology were elevated. denies f/c/s, n/v/d, hemoptysis, PND, leg swelling Denies chest pain or edema     Observations/Objective:     01/06/2023   11:27 AM 01/06/2023    9:39 AM 12/30/2022   11:26 AM  Vitals with BMI  Height 4\' 11"  4\' 11"  4\' 11"   Weight 108 lbs 109 lbs 109 lbs 4 oz  BMI 21.8 22 22.05  Systolic 99 80 98  Diastolic 67 64 62  Pulse 72 72 63      Assessment and Plan:  Anxiety:  Will consult with pharmacy for medication change.    I discussed the assessment and treatment plan with the patient. The patient was provided an opportunity to ask questions and all were answered. The patient agreed with the plan and demonstrated an understanding of the instructions.   The patient was advised to call back or seek an in-person evaluation if the symptoms worsen or if the condition fails to improve as  anticipated.  I provided 23 minutes of non-face-to-face time during this encounter.   Ivonne Andrew, NP

## 2023-01-08 NOTE — Telephone Encounter (Signed)
Please call to let patient know that we are switching her to Zoloft. I have sent in the new prescription.

## 2023-01-11 ENCOUNTER — Encounter (HOSPITAL_COMMUNITY): Payer: Self-pay | Admitting: *Deleted

## 2023-01-11 NOTE — Progress Notes (Signed)
Request for pts tissue be sent to [facility] for molecular studies emailed to Lita Mains, [facility] pathology technician, at this time.

## 2023-01-11 NOTE — Progress Notes (Signed)
The proposed treatment discussed in conference is for discussion purpose only and is not a binding recommendation.  The patients have not been physically examined, or presented with their treatment options.  Therefore, final treatment plans cannot be decided.  

## 2023-01-13 ENCOUNTER — Inpatient Hospital Stay: Payer: Medicare Other

## 2023-01-13 ENCOUNTER — Inpatient Hospital Stay: Payer: Medicare Other | Attending: Oncology | Admitting: Oncology

## 2023-01-13 ENCOUNTER — Encounter: Payer: Self-pay | Admitting: Oncology

## 2023-01-13 ENCOUNTER — Other Ambulatory Visit: Payer: Self-pay

## 2023-01-13 ENCOUNTER — Telehealth: Payer: Self-pay | Admitting: *Deleted

## 2023-01-13 VITALS — BP 93/59 | HR 78 | Temp 98.1°F | Resp 18 | Wt 107.9 lb

## 2023-01-13 DIAGNOSIS — K869 Disease of pancreas, unspecified: Secondary | ICD-10-CM

## 2023-01-13 DIAGNOSIS — Z79899 Other long term (current) drug therapy: Secondary | ICD-10-CM | POA: Diagnosis not present

## 2023-01-13 DIAGNOSIS — R7989 Other specified abnormal findings of blood chemistry: Secondary | ICD-10-CM

## 2023-01-13 DIAGNOSIS — N186 End stage renal disease: Secondary | ICD-10-CM | POA: Diagnosis not present

## 2023-01-13 DIAGNOSIS — Z940 Kidney transplant status: Secondary | ICD-10-CM | POA: Diagnosis not present

## 2023-01-13 DIAGNOSIS — Z992 Dependence on renal dialysis: Secondary | ICD-10-CM | POA: Diagnosis not present

## 2023-01-13 DIAGNOSIS — C3412 Malignant neoplasm of upper lobe, left bronchus or lung: Secondary | ICD-10-CM

## 2023-01-13 DIAGNOSIS — F17218 Nicotine dependence, cigarettes, with other nicotine-induced disorders: Secondary | ICD-10-CM

## 2023-01-13 DIAGNOSIS — R9089 Other abnormal findings on diagnostic imaging of central nervous system: Secondary | ICD-10-CM

## 2023-01-13 DIAGNOSIS — D649 Anemia, unspecified: Secondary | ICD-10-CM

## 2023-01-13 DIAGNOSIS — C3490 Malignant neoplasm of unspecified part of unspecified bronchus or lung: Secondary | ICD-10-CM

## 2023-01-13 DIAGNOSIS — D631 Anemia in chronic kidney disease: Secondary | ICD-10-CM | POA: Insufficient documentation

## 2023-01-13 DIAGNOSIS — Z905 Acquired absence of kidney: Secondary | ICD-10-CM | POA: Insufficient documentation

## 2023-01-13 DIAGNOSIS — Z7901 Long term (current) use of anticoagulants: Secondary | ICD-10-CM | POA: Insufficient documentation

## 2023-01-13 DIAGNOSIS — F17200 Nicotine dependence, unspecified, uncomplicated: Secondary | ICD-10-CM | POA: Insufficient documentation

## 2023-01-13 LAB — VITAMIN B12: Vitamin B-12: 794 pg/mL (ref 180–914)

## 2023-01-13 LAB — IRON AND IRON BINDING CAPACITY (CC-WL,HP ONLY)
Iron: 65 ug/dL (ref 28–170)
Saturation Ratios: 30 % (ref 10.4–31.8)
TIBC: 218 ug/dL — ABNORMAL LOW (ref 250–450)
UIBC: 153 ug/dL (ref 148–442)

## 2023-01-13 LAB — CBC WITH DIFFERENTIAL/PLATELET
Abs Immature Granulocytes: 0.03 10*3/uL (ref 0.00–0.07)
Basophils Absolute: 0 10*3/uL (ref 0.0–0.1)
Basophils Relative: 0 %
Eosinophils Absolute: 0 10*3/uL (ref 0.0–0.5)
Eosinophils Relative: 0 %
HCT: 27.6 % — ABNORMAL LOW (ref 36.0–46.0)
Hemoglobin: 8.9 g/dL — ABNORMAL LOW (ref 12.0–15.0)
Immature Granulocytes: 1 %
Lymphocytes Relative: 19 %
Lymphs Abs: 0.9 10*3/uL (ref 0.7–4.0)
MCH: 28.8 pg (ref 26.0–34.0)
MCHC: 32.2 g/dL (ref 30.0–36.0)
MCV: 89.3 fL (ref 80.0–100.0)
Monocytes Absolute: 0.2 10*3/uL (ref 0.1–1.0)
Monocytes Relative: 4 %
Neutro Abs: 3.6 10*3/uL (ref 1.7–7.7)
Neutrophils Relative %: 76 %
Platelets: 241 10*3/uL (ref 150–400)
RBC: 3.09 MIL/uL — ABNORMAL LOW (ref 3.87–5.11)
RDW: 19.7 % — ABNORMAL HIGH (ref 11.5–15.5)
WBC: 4.7 10*3/uL (ref 4.0–10.5)
nRBC: 0 % (ref 0.0–0.2)

## 2023-01-13 LAB — COMPREHENSIVE METABOLIC PANEL
ALT: 5 U/L (ref 0–44)
AST: 13 U/L — ABNORMAL LOW (ref 15–41)
Albumin: 3.9 g/dL (ref 3.5–5.0)
Alkaline Phosphatase: 171 U/L — ABNORMAL HIGH (ref 38–126)
Anion gap: 12 (ref 5–15)
BUN: 18 mg/dL (ref 6–20)
CO2: 27 mmol/L (ref 22–32)
Calcium: 10.1 mg/dL (ref 8.9–10.3)
Chloride: 92 mmol/L — ABNORMAL LOW (ref 98–111)
Creatinine, Ser: 5.14 mg/dL — ABNORMAL HIGH (ref 0.44–1.00)
GFR, Estimated: 9 mL/min — ABNORMAL LOW (ref >60)
Glucose, Bld: 113 mg/dL — ABNORMAL HIGH (ref 70–99)
Potassium: 4 mmol/L (ref 3.5–5.1)
Sodium: 131 mmol/L — ABNORMAL LOW (ref 135–145)
Total Bilirubin: 0.4 mg/dL (ref <1.2)
Total Protein: 7 g/dL (ref 6.5–8.1)

## 2023-01-13 LAB — LACTATE DEHYDROGENASE: LDH: 123 U/L (ref 98–192)

## 2023-01-13 LAB — CEA (IN HOUSE-CHCC): CEA (CHCC-In House): 532.24 ng/mL — ABNORMAL HIGH (ref 0.00–5.00)

## 2023-01-13 LAB — FERRITIN: Ferritin: 1711 ng/mL — ABNORMAL HIGH (ref 11–307)

## 2023-01-13 LAB — TSH: TSH: 4.928 u[IU]/mL — ABNORMAL HIGH (ref 0.350–4.500)

## 2023-01-13 LAB — FOLATE: Folate: 3.4 ng/mL — ABNORMAL LOW (ref >5.9)

## 2023-01-13 NOTE — Progress Notes (Signed)
[name]  ONCOLOGY CLINIC PROGRESS NOTE   Patient Care Team: Ivonne Andrew, NP as PCP - General (Pulmonary Disease) O'Neal, Ronnald Ramp, MD as PCP - Cardiology (Cardiology) Bensimhon, Bevelyn Buckles, MD as PCP - Advanced Heart Failure (Cardiology) Lytle Butte, RN as Oncology Nurse Navigator  PATIENT NAME: Tina Watkins   MR#: [id number] DOB: 1965-04-13  Date of visit: 01/13/2023   ASSESSMENT & PLAN:   Tina Watkins is a 57 y.o. lady with multiple medical comorbidities including kidney transplant, currently ESRD on HD, congestive heart failure, atrial fibrillation/flutter, COPD, depression, chronic gout, chronic pain, anemia of chronic disease, gastroparesis, midodrine dependent hypotension, history of strokes, suspected mesenteric ischemia, was recently diagnosed with malignant lung nodule on 11/16/2022 via bronchoscopy.  Immunostains suggestive of adenocarcinoma.  Lung cancer, upper lobe (HCC) -Please review oncology history for additional details and timeline of events.  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  -Her case was discussed in tumor conference on 01/07/2023.  Consensus opinion is that these lung nodules are malignancy related.  Hence clinically it appears to be stage IVa disease.  All treatment options are palliative in nature and not curative in intent and this was explained to patient and her fianc and they verbalized understanding.  -Previously we submitted request for Foundation One mutation testing on the specimen.  Today we will obtain Guardant360 for NGS mutation testing to determine if targeted therapy or immunotherapy alone is an option.  -Plan for chemotherapy with immunotherapy or immunotherapy alone, pending the results of the mutation testing.  Since Alimta could not be used because she is on  hemodialysis, will plan for carboplatin, paclitaxel, pembrolizumab for 4 cycles followed by maintenance treatments with pembrolizumab alone every 3 weeks.  We will dose reduce carboplatin to AUC 4 and paclitaxel to 150 mg/m, given dialysis status.  -Order port-a-cath placement for chemotherapy administration.  -Order MRI of the brain to check for metastasis.  -Plan for chemotherapy education session.  -Will follow-up on workup on pancreatic lesion to see if it is a primary pancreatic malignancy or metastatic from lung primary.  -We will arrange follow-up appointments depending on above results.  Lesion of pancreas Heterogeneous pancreatic cystic lesion was not FDG avid on recent PET scan.  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  This will need additional evaluation.    -Case reviewed by Dr. Meridee Score who recommended workup at a tertiary GI center.  Patient has appointment coming up at [facility] in January 2025.  Will follow-up on the results to see if she has primary pancreatic neoplasm versus metastatic disease from lung primary.  Further treatment options will depend on biopsy results.  -Her CA 19-9 was undetectable but CEA was elevated at 230 on 12/30/2022.  This will be monitored on future visits.  Anemia in chronic kidney disease Possibly related to kidney disease. -Order iron and other relevant tests to determine if replacement is needed.  Nicotine dependence Patient is a current smoker. -Advise patient to quit smoking.    I reviewed lab results and outside records for this visit and discussed relevant results with the patient. Diagnosis, plan of care and treatment options were also discussed in detail with the patient. Opportunity provided to ask questions and answers provided to her apparent satisfaction. Provided instructions to call our  clinic with any problems, questions or concerns prior to return  visit. I recommended to continue follow-up with PCP and sub-specialists. She verbalized understanding and agreed with the plan.   NCCN guidelines have been consulted in the planning of this patient's care.  I spent a total of 40 minutes during this encounter with the patient including review of chart and various tests results, discussions about plan of care and coordination of care plan.   Meryl Crutch, MD  01/13/2023 1:11 PM  [facility] - A DEPT OF [facility] [address] [place] [address] Dept: [phone] Dept Fax: [phone]    CHIEF COMPLAINT/ REASON FOR VISIT:   Recently diagnosed lung adenocarcinoma with multiple bilateral lung nodules, stage IVa disease.  Current Treatment: Plan for palliative systemic chemoimmunotherapy with carboplatin, paclitaxel and pembrolizumab.  Pemetrexed could not be used because of hemodialysis status.  INTERVAL HISTORY:    Discussed the use of AI scribe software for clinical note transcription with the patient, who gave verbal consent to proceed.   Tina Watkins is here today to establish care in our clinic.  She was accompanied by her fianc.  She reports cold, cough, and phlegm. The phlegm is described as white and similar to boogers. The patient also reports a loss of appetite and a recent weight loss of two pounds. The patient has been experiencing diarrhea, which is new and could be related to recent antibiotic use. The patient also reports pressure in the chest but denies any pain in the belly or any problems with bowel movements. The patient is scheduled for a biopsy of pancreatic mass at [facility].  I have reviewed the past medical history, past surgical history, social history and family history with the patient and they are unchanged from previous note.  HISTORY OF PRESENT ILLNESS:   ONCOLOGY HISTORY:   Patient was seen by pulmonology after she  was diagnosed with COVID-19 infection in the ED on 09/14/2022.  Because of her chronic smoking history with at least 51-pack-year smoking, a low-dose CT of the chest was obtained for lung cancer screening.  On 09/28/2022, CT chest showed dominant new solid peripheral left upper lobe lung nodule measuring 21 mm.  Several new clustered solid pulmonary nodules throughout left upper lobe, largest measuring 5 mm.  Numerous additional tiny 2 to 3 mm solid pulmonary nodules scattered in both lungs, new finding.  Findings concerning for primary bronchogenic malignancy.  Lung RADS 4B, suspicious.  Also noted was cystic pancreatic tail lesions, largest measuring 2.6 cm posteriorly, indeterminate.  MRI abdomen recommended for further evaluation.   Because of CT chest findings, PET/CT was obtained on 10/19/2022 and it showed 2.6 cm subpleural nodule in the left upper lobe with SUV max of 4.9.  Primary bronchogenic neoplasm could not be excluded.  Tissue sampling was recommended.  Also noted was 15 mm short axis prevascular lymph node with SUV of 2.4, new compared to 2023.  Indeterminate.  Small left pleural effusion was also noted.  Several subcentimeter lung nodules were noted on CT scan . They are below the threshold by size, for determining FDG activity and hence were not apparent on PET scan.    MRI of the abdomen on 11/11/2022: Evaluation of the pancreatic tail was limited by metallic susceptibility artifact from adjacent surgical clips as well as iron burden in the adjacent stomach parenchyma.  There was a complex, multiseptated, internally heterogeneous cystic lesion arising  from the tip of the pancreatic tail measuring 4.4 x 2.3 x 3.2 cm.  Contrast enhancement could not be confirmed due to technical limitations.  Appearance was concerning for a partially cystic malignancy with a significant solid component.  Tissue sampling was recommended.  For future, recommended multiphasic contrast-enhanced CT.  Severe hepatic and  splenic iron deposition was noted.  Coarse, nodular contour of the liver, consistent with cirrhosis.  Status post bilateral native nephrectomy.  Right lower quadrant renal transplant graft was noted.  Diffusely heterogeneous bone marrow, reflecting marrow reconversion renal osteodystrophy.   On 11/11/2022, she had multidetector CT imaging of the chest using thin slice collimation for electromagnetic bronchoscopy planning purposes.  It showed similar findings compared to prior imaging.   On 11/16/2022, Dr. Delton Coombes performed robotic assisted navigational bronchoscopy and biopsies.  Left upper lobe lung nodule needle aspiration biopsy showed presence of malignant cells.  Immunostains were obtained at a later date and showed positivity for TTF-1 and negative for p40 and cytokeratin 5/6, consistent with lung adenocarcinoma.  Endobronchial brushings and brushings from left upper lobe lung nodule showed no malignant cells.   She was admitted to the hospital on 12/18/2022 after she presented with shortness of breath, cough with yellow sputum, chills, myalgias and diarrhea.  She was noted to be in acute hypoxic respiratory failure, related to postobstructive pneumonia.  CT angiogram of the chest on 12/18/2022 showed no evidence of pulmonary embolism.  It did show numerous small nodules throughout right lung which appeared to be increased in number compared to prior study.  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  Stable mediastinal lymphadenopathy.   Underwent left-sided thoracentesis on 12/20/2022.  Pleural fluid cytology showed no evidence of malignant cells.   Plan for palliative chemoimmunotherapy with carboplatin, paclitaxel and pembrolizumab.  Mutational testing pending and treatments will be tailored depending on results.  Oncology History  Lung cancer, upper lobe (HCC)  10/28/2022  Initial Diagnosis   Lung cancer, upper lobe (HCC)   01/11/2023 Cancer Staging   Staging form: Lung, AJCC 8th Edition - Clinical: Stage IVA (cTX, cNX, cM1a) - Signed by Meryl Crutch, MD on 01/11/2023   01/29/2023 -  Chemotherapy   Patient is on Treatment Plan : LUNG NSCLC Carboplatin (6) + Paclitaxel (200) + Pembrolizumab (200) D1 q21d x 4 cycles / Pembrolizumab (200) Maintenance D1 q21d         REVIEW OF SYSTEMS:   Review of Systems - Oncology  All other pertinent systems were reviewed with the patient and are negative.  ALLERGIES: She is allergic to levofloxacin and tape.  MEDICATIONS:  Current Outpatient Medications  Medication Sig Dispense Refill   allopurinol (ZYLOPRIM) 100 MG tablet Take 0.5 tablets (50 mg total) by mouth 2 (two) times a week. (Patient taking differently: Take 100 mg by mouth daily in the afternoon.) 90 tablet 3   amiodarone (PACERONE) 200 MG tablet Take 0.5 tablets (100 mg total) by mouth daily. 90 tablet 1   apixaban (ELIQUIS) 2.5 MG TABS tablet Take 1 tablet (2.5 mg total) by mouth 2 (two) times daily. Okay to restart this medication on 11/18/2022     Colchicine 0.6 MG CAPS Take 0.6 mg by mouth daily as needed (gout flare up). 30 capsule 2   cyclobenzaprine (FLEXERIL) 5 MG tablet Take 1 tablet (5 mg total) by mouth at bedtime as needed. 90 tablet 1   diclofenac  Sodium (VOLTAREN) 1 % GEL Apply 4 g topically as needed. 50 g 0   diphenhydrAMINE (BENADRYL) 25 mg capsule Take 1 capsule (25 mg total) by mouth every 8 (eight) hours as needed. 30 capsule 6   dorzolamide-timolol (COSOPT) 2-0.5 % ophthalmic solution Place 1 drop into the left eye 2 (two) times daily.     famotidine (PEPCID) 20 MG tablet TAKE 1 TABLET BY MOUTH TWICE A DAY 180 tablet 3   fluticasone-salmeterol (ADVAIR HFA) 115-21 MCG/ACT inhaler Inhale 2 puffs into the lungs 2 (two) times daily. 1 each 12   gabapentin (NEURONTIN) 100 MG capsule Take 100 mg by mouth 3 (three) times daily as needed (nerve  pain).     heparin sodium, porcine, 1000 UNIT/ML injection Inject 1,000 Units into the peritoneum once.     iron sucrose (VENOFER) 20 MG/ML injection Inject 100 mg into the vein one time in dialysis.     lidocaine (LIDODERM) 5 % Place 1 patch onto the skin daily as needed (for pain- remove old patch first).      metoCLOPramide (REGLAN) 10 MG tablet Take 1 tablet (10 mg total) by mouth 3 (three) times daily before meals. (Patient taking differently: Take 10 mg by mouth daily in the afternoon.) 90 tablet 2   midodrine (PROAMATINE) 10 MG tablet Take 1.5 tablets (15 mg total) by mouth 3 (three) times daily. NEEDS FOLLOW UP APPOINTMENT FOR ANYMORE REFILLS 405 tablet 0   omeprazole (PRILOSEC) 40 MG capsule TAKE 1 CAPSULE (40 MG TOTAL) BY MOUTH DAILY. 90 capsule 1   oxyCODONE-acetaminophen (PERCOCET) 10-325 MG tablet Take 1 tablet by mouth 4 (four) times daily as needed for pain.     sertraline (ZOLOFT) 25 MG tablet Take 1 tablet (25 mg total) by mouth daily. 30 tablet 3   VELPHORO 500 MG chewable tablet Taking 500 mg by mouth per meal or snack     zolpidem (AMBIEN) 10 MG tablet Take 1 tablet (10 mg total) by mouth at bedtime as needed for sleep. 10 tablet 0   No current facility-administered medications for this visit.     VITALS:   Blood pressure (!) 93/59, pulse 78, temperature 98.1 F (36.7 C), resp. rate 18, weight 107 lb 14.4 oz (48.9 kg), SpO2 100%.  Wt Readings from Last 3 Encounters:  01/13/23 107 lb 14.4 oz (48.9 kg)  01/06/23 109 lb (49.4 kg)  01/06/23 108 lb (49 kg)    Body mass index is 21.79 kg/m.  Performance status (ECOG): 1 - Symptomatic but completely ambulatory  PHYSICAL EXAM:   Physical Exam Constitutional:      General: She is not in acute distress.    Appearance: Normal appearance.  HENT:     Head: Normocephalic and atraumatic.  Cardiovascular:     Rate and Rhythm: Normal rate and regular rhythm.     Heart sounds: Normal heart sounds.  Pulmonary:     Effort:  Pulmonary effort is normal.     Comments: Slightly decreased breath sounds in the left base Abdominal:     General: There is no distension.  Musculoskeletal:     Right lower leg: No edema.     Left lower leg: No edema.  Neurological:     General: No focal deficit present.     Mental Status: She is alert and oriented to person, place, and time.  Psychiatric:        Mood and Affect: Mood normal.        Behavior: Behavior normal.  Thought Content: Thought content normal.       LABORATORY DATA:   I have reviewed the data as listed.  Results for orders placed or performed in visit on 01/13/23  Lactate dehydrogenase  Result Value Ref Range   LDH 123 98 - 192 U/L  Iron and Iron Binding Capacity ([facility] only)  Result Value Ref Range   Iron 65 28 - 170 ug/dL   TIBC 409 (L) 811 - 914 ug/dL   Saturation Ratios 30 10.4 - 31.8 %   UIBC 153 148 - 442 ug/dL  Comprehensive metabolic panel  Result Value Ref Range   Sodium 131 (L) 135 - 145 mmol/L   Potassium 4.0 3.5 - 5.1 mmol/L   Chloride 92 (L) 98 - 111 mmol/L   CO2 27 22 - 32 mmol/L   Glucose, Bld 113 (H) 70 - 99 mg/dL   BUN 18 6 - 20 mg/dL   Creatinine, Ser 7.82 (H) 0.44 - 1.00 mg/dL   Calcium 95.6 8.9 - 21.3 mg/dL   Total Protein 7.0 6.5 - 8.1 g/dL   Albumin 3.9 3.5 - 5.0 g/dL   AST 13 (L) 15 - 41 U/L   ALT 5 0 - 44 U/L   Alkaline Phosphatase 171 (H) 38 - 126 U/L   Total Bilirubin 0.4 <1.2 mg/dL   GFR, Estimated 9 (L) >60 mL/min   Anion gap 12 5 - 15  CBC with Differential/Platelet  Result Value Ref Range   WBC 4.7 4.0 - 10.5 K/uL   RBC 3.09 (L) 3.87 - 5.11 MIL/uL   Hemoglobin 8.9 (L) 12.0 - 15.0 g/dL   HCT 08.6 (L) 57.8 - 46.9 %   MCV 89.3 80.0 - 100.0 fL   MCH 28.8 26.0 - 34.0 pg   MCHC 32.2 30.0 - 36.0 g/dL   RDW 62.9 (H) 52.8 - 41.3 %   Platelets 241 150 - 400 K/uL   nRBC 0.0 0.0 - 0.2 %   Neutrophils Relative % 76 %   Neutro Abs 3.6 1.7 - 7.7 K/uL   Lymphocytes Relative 19 %   Lymphs Abs 0.9 0.7 - 4.0  K/uL   Monocytes Relative 4 %   Monocytes Absolute 0.2 0.1 - 1.0 K/uL   Eosinophils Relative 0 %   Eosinophils Absolute 0.0 0.0 - 0.5 K/uL   Basophils Relative 0 %   Basophils Absolute 0.0 0.0 - 0.1 K/uL   Immature Granulocytes 1 %   Abs Immature Granulocytes 0.03 0.00 - 0.07 K/uL      RADIOGRAPHIC STUDIES:  I have personally reviewed the radiological images as listed and agree with the findings in the report.  US Abdomen Limited RUQ (LIVER/GB)  Result Date: 12/23/2022 CLINICAL DATA:  Cirrhosis. EXAM: ULTRASOUND ABDOMEN LIMITED RIGHT UPPER QUADRANT COMPARISON:  None Available. FINDINGS: Gallbladder: No gallstones or wall thickening visualized. No sonographic Murphy sign noted by sonographer. Common bile duct: Diameter: 5 mm Liver: Heterogeneous lobular appearance to the liver parenchyma. portal vein is patent on color Doppler imaging with normal direction of blood flow towards the liver. Other: None. IMPRESSION: Distended gallbladder.  No stones or ductal dilatation. Heterogeneous lobular liver. Electronically Signed   By: Karen Kays M.D.   On: 12/23/2022 11:38   US THORACENTESIS ASP PLEURAL SPACE W/IMG GUIDE  Result Date: 12/20/2022 INDICATION: Recent diagnosis of left upper lobe malignancy with left pleural effusion. Request for diagnostic and therapeutic thoracentesis. EXAM: ULTRASOUND GUIDED LEFT THORACENTESIS MEDICATIONS: 1% lidocaine 10 mL COMPLICATIONS: None immediate. PROCEDURE: An ultrasound guided  thoracentesis was thoroughly discussed with the patient and questions answered. The benefits, risks, alternatives and complications were also discussed. The patient understands and wishes to proceed with the procedure. Written consent was obtained. Ultrasound was performed to localize and mark an adequate pocket of fluid in the left chest. The area was then prepped and draped in the normal sterile fashion. 1% Lidocaine was used for local anesthesia. Under ultrasound guidance a 6 Fr  Safe-T-Centesis catheter was introduced. Thoracentesis was performed. The catheter was removed and a dressing applied. FINDINGS: A total of approximately 470 mL of hazy amber fluid was removed. Samples were sent to the laboratory as requested by the clinical team. IMPRESSION: Successful ultrasound guided left thoracentesis yielding 470 mL of pleural fluid. Procedure performed by: Corrin Parker, PA-C Electronically Signed   By: Richarda Overlie M.D.   On: 12/20/2022 10:17   DG Chest 1 View  Result Date: 12/20/2022 CLINICAL DATA:  Thoracentesis EXAM: CHEST  1 VIEW COMPARISON:  12/18/2022 FINDINGS: Stable cardiomediastinal contours. SVC stent. Persistent left upper lobe collapse. Trace left pleural effusion, decreased. Increasing interstitial opacities throughout both lungs. No pneumothorax. IMPRESSION: 1. Trace left pleural effusion, decreased. No pneumothorax. 2. Increasing interstitial opacities throughout both lungs, which may reflect edema or atypical infection. 3. Persistent left upper lobe collapse. Electronically Signed   By: Duanne Guess D.O.   On: 12/20/2022 09:48   CT Angio Chest PE W and/or Wo Contrast  Result Date: 12/18/2022 CLINICAL DATA:  Shortness of breath EXAM: CT ANGIOGRAPHY CHEST WITH CONTRAST TECHNIQUE: Multidetector CT imaging of the chest was performed using the standard protocol during bolus administration of intravenous contrast. Multiplanar CT image reconstructions and MIPs were obtained to evaluate the vascular anatomy. RADIATION DOSE REDUCTION: This exam was performed according to the departmental dose-optimization program which includes automated exposure control, adjustment of the mA and/or kV according to patient size and/or use of iterative reconstruction technique. CONTRAST:  60mL OMNIPAQUE IOHEXOL 350 MG/ML SOLN COMPARISON:  Chest x-ray today.  Chest CT 11/11/2022 FINDINGS: Cardiovascular: No filling defects in the pulmonary arteries to suggest pulmonary emboli. Heart is normal  size. Aorta is normal caliber. Scattered aortic atherosclerosis. There appears to be stenosis in the left brachiocephalic vein and upper SVC with extensive collateral venous channels in the left chest. Aortic atherosclerosis. Mediastinum/Nodes: Right paratracheal adenopathy with lymph node measuring up to 1.9 cm in short axis diameter on image 27, stable since prior study. No visible hilar or axillary adenopathy. Trachea and esophagus are unremarkable. Thyroid unremarkable. Lungs/Pleura: Complete consolidation of the left upper lobe, new since prior study. Previously described left upper lobe mass cannot be visualized due to complete consolidation. Moderate left pleural effusion. Dependent opacities posteriorly right lower lobe, stable and likely reflecting atelectasis or scarring. Numerous small nodules throughout the right lung, appear to be increasing in number since prior study. Upper Abdomen: No acute findings Musculoskeletal: No acute bony abnormality. Review of the MIP images confirms the above findings. IMPRESSION: No evidence of pulmonary embolus. Complete consolidation of the left upper lobe. Moderate left pleural effusion. Numerous small nodules throughout the right lung appear to be increasing in number since prior study. 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 Stable mediastinal adenopathy. Aortic Atherosclerosis (ICD10-I70.0). Electronically Signed   By: Charlett Nose M.D.   On: 12/18/2022 20:22   DG Chest 2 View  Result Date: 12/18/2022 CLINICAL DATA:  Shortness of  breath. EXAM: CHEST - 2 VIEW COMPARISON:  11/16/2022. FINDINGS: Since the prior study, there is new homogeneous opacification of left upper hemithorax. There also heterogeneous opacities in the left lower hemithorax. There are increased interstitial markings in the right lung which may represent mild underlying  pulmonary edema. Right lung is otherwise clear. No dense consolidation or lung collapse. Bilateral costophrenic angles are clear. Stable cardio-mediastinal silhouette. Redemonstration of vascular stent in the superior vena cava. No acute osseous abnormalities. Partially seen lower cervical spinal fixation hardware. The soft tissues are within normal limits. IMPRESSION: *Findings favor near complete left upper lobe lung collapse. Contrast-enhanced CT scan chest and/or bronchoscopy is recommended. *Increased interstitial markings in the right lung may represent mild underlying pulmonary edema. Correlate clinically. *No pleural effusion. Electronically Signed   By: Jules Schick M.D.   On: 12/18/2022 12:02    CODE STATUS:  Code Status History     Date Active Date Inactive Code Status Order ID Comments User Context   12/18/2022 1552 12/24/2022 2039 Full Code [id number]  Modena Slater, DO ED   09/06/2021 1325 09/07/2021 2107 Full Code [id number]  Evlyn Kanner, MD ED   03/28/2020 1621 03/29/2020 2052 Full Code [id number]  Emeline General, MD ED   09/01/2019 1812 09/03/2019 2011 Full Code [id number]  Jerald Kief, MD ED   08/05/2019 1443 08/25/2019 0311 Full Code [id number]  Emeline General, MD ED   07/07/2019 1616 07/17/2019 2317 Full Code [id number]  Emeline General, MD ED   05/09/2019 0706 05/18/2019 2040 Full Code [id number]  John Giovanni, MD ED   03/20/2019 1507 03/24/2019 2243 Full Code [id number]  Emeline General, MD Inpatient   11/20/2017 0236 11/20/2017 1916 Full Code [id number]  Lorretta Harp, MD ED   08/09/2017 2312 08/17/2017 1950 Full Code [id number]  Lennox Solders, MD Inpatient   09/29/2015 2318 10/03/2015 1841 Full Code [id number]  Bobette Mo, MD Inpatient   09/29/2015 2318 09/29/2015 2318 Full Code [id number]  Bobette Mo, MD Inpatient   01/08/2015 1637 01/10/2015 1918 Full Code [id number]  Hilda Lias, MD Inpatient   06/13/2014 1008 06/16/2014 1800 Full Code [id number]  Catarina Hartshorn, MD Inpatient    04/10/2012 0109 04/11/2012 2008 Full Code [id number]  Tarry Kos, MD Inpatient   12/10/2011 2204 12/12/2011 1423 Full Code [id number]  Donivan Scull, RN Inpatient   05/22/2011 1520 05/30/2011 1553 Full Code [id number]  Minor, Vilinda Blanks, NP ED    Questions for Most Recent Historical Code Status (Order [id number])     Question Answer   By: Consent: discussion documented in EHR            Orders Placed This Encounter  Procedures   MR Brain W Wo Contrast    Standing Status:   Future    Number of Occurrences:   1    Standing Expiration Date:   01/13/2024    Order Specific Question:   If indicated for the ordered procedure, I authorize the administration of contrast media per Radiology protocol    Answer:   Yes    Order Specific Question:   What is the patient's sedation requirement?    Answer:   No Sedation    Order Specific Question:   Does the patient have a pacemaker or implanted devices?    Answer:   No    Order Specific Question:   Use SRS Protocol?    Answer:   No    Order Specific Question:   Preferred imaging location?  Answer:   [facility] (table limit - 550 lbs)   IR IMAGING GUIDED PORT INSERTION    Standing Status:   Future    Standing Expiration Date:   01/13/2024    Order Specific Question:   Reason for Exam (SYMPTOM  OR DIAGNOSIS REQUIRED)    Answer:   New lung cancer dx. Needs chemo    Order Specific Question:   Informed consent is required to be obtained if the patient has a GFR less than 47mL/min (consider nephrology consult)    Answer:   Informed consent obtained    Order Specific Question:   Is the patient pregnant?    Answer:   No    Order Specific Question:   Preferred Imaging Location?    Answer:   [facility]   CBC with Differential/Platelet    Standing Status:   Future    Number of Occurrences:   1    Standing Expiration Date:   01/13/2024   Comprehensive metabolic panel    Standing Status:   Future    Number of Occurrences:   1     Standing Expiration Date:   01/13/2024   Iron and Iron Binding Capacity ([facility] only)    Standing Status:   Future    Number of Occurrences:   1    Standing Expiration Date:   01/13/2024   Ferritin    Standing Status:   Future    Number of Occurrences:   1    Standing Expiration Date:   01/13/2024   Vitamin B12    Standing Status:   Future    Number of Occurrences:   1    Standing Expiration Date:   01/13/2024   Folate    Standing Status:   Future    Number of Occurrences:   1    Standing Expiration Date:   01/13/2024   TSH    Standing Status:   Future    Number of Occurrences:   1    Standing Expiration Date:   01/13/2024   Lactate dehydrogenase    Standing Status:   Future    Number of Occurrences:   1    Standing Expiration Date:   01/13/2024   Haptoglobin    Standing Status:   Future    Number of Occurrences:   1    Standing Expiration Date:   01/13/2024   Guardant 360    Standing Status:   Future    Number of Occurrences:   1    Standing Expiration Date:   01/13/2024   CEA (IN HOUSE-[facility])   Thyroid Profile     Future Appointments  Date Time Provider Department Center  01/29/2023  4:00 PM WL-MR 1 WL-MRI [place]  02/24/2023  8:00 AM Rice, Jamesetta Orleans, MD CR-G[facility] None  03/31/2023 10:00 AM Leslye Peer, MD L[facility]-PULCARE None  04/07/2023 10:40 AM Ivonne Andrew, NP SCC-SCC None  07/01/2023  2:00 PM SCC-ANNUAL WELLNESS VISIT SCC-SCC None  07/21/2023  9:30 AM Eustace Pen, PA-C [facility]-AFIBC None      This document was completed utilizing speech recognition software. Grammatical errors, random word insertions, pronoun errors, and incomplete sentences are an occasional consequence of this system due to software limitations, ambient noise, and hardware issues. Any formal questions or concerns about the content, text or information contained within the body of this dictation should be directly addressed to the provider for clarification.

## 2023-01-13 NOTE — Telephone Encounter (Signed)
Received a call from Tiffany in IR stating pt was not sure about getting PAC for chemotherapy and needed to speak with Dr.Pasam's nurse. This nurse called and educated pt on PAC insertion and the need for a PAC for the best line for administering future treatment and providing the best practice for successful experience for her. Pt verbalized understanding and stated that a phone call will be made to schedule port insertion and will call office if further assistance is needed

## 2023-01-13 NOTE — Assessment & Plan Note (Signed)
Patient is a current smoker. -Advise patient to quit smoking.

## 2023-01-13 NOTE — Assessment & Plan Note (Signed)
Possibly related to kidney disease. -Order iron and other relevant tests to determine if replacement is needed.

## 2023-01-13 NOTE — Assessment & Plan Note (Signed)
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  This will need additional evaluation.    -Case reviewed by Dr. Meridee Score who recommended workup at a tertiary GI center.  Patient has appointment coming up at [facility] in January 2025.  Will follow-up on the results to see if she has primary pancreatic neoplasm versus metastatic disease from lung primary.  Further treatment options will depend on biopsy results.  -Her CA 19-9 was undetectable but CEA was elevated at 230 on 12/30/2022.  This will be monitored on future visits.

## 2023-01-13 NOTE — Assessment & Plan Note (Signed)
-  Please review oncology history for additional details and timeline of events.  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  -Her case was discussed in tumor conference on 01/07/2023.  Consensus opinion is that these lung nodules are malignancy related.  Hence clinically it appears to be stage IVa disease.  All treatment options are palliative in nature and not curative in intent and this was explained to patient and her fianc and they verbalized understanding.  -Previously we submitted request for Foundation One mutation testing on the specimen.  Today we will obtain Guardant360 for NGS mutation testing to determine if targeted therapy or immunotherapy alone is an option.  -Plan for chemotherapy with immunotherapy or immunotherapy alone, pending the results of the mutation testing.  Since Alimta could not be used because she is on hemodialysis, will plan for carboplatin, paclitaxel, pembrolizumab for 4 cycles followed by maintenance treatments with pembrolizumab alone every 3 weeks.  We will dose reduce carboplatin to AUC 4 and paclitaxel to 150 mg/m, given dialysis status.  -Order port-a-cath placement for chemotherapy administration.  -Order MRI of the brain to check for metastasis.  -Plan for chemotherapy education session.  -Will follow-up on workup on pancreatic lesion to see if it is a primary pancreatic malignancy or metastatic from lung primary.  -We will arrange follow-up appointments depending on above results.

## 2023-01-13 NOTE — Addendum Note (Signed)
Addended by: Renelda Loma on: 01/13/2023 11:23 AM   Modules accepted: Orders

## 2023-01-13 NOTE — Progress Notes (Signed)
Pt notifed that her brain MRI has been scheduled for 12/27 at 4pm at [facility] and that she is to arrive prior. Pt denied questions about her appt.

## 2023-01-13 NOTE — Progress Notes (Signed)
I met with the Tina Watkins this morning during her consult with Dr. Arlana Pouch. Tina Watkins was accompanied by her fiance, [name]. Tina Watkins was informed that she has stage IV adenocarcinoma. We are currently awaiting the results of molecular testing on her tissue and a Guardant blood draw will be performed today to see if the pts tumor has any mutations that may make her a candidate for targeted therapy. If there are no actionable mutations, but will receive 4 cycles of carbo/taxol/keytruda. Tina Watkins is aware she will need a Brain MRI to complete her staging work-up. The Tina Watkins currently has dialysis 3 times a week. I provided the Tina Watkins with a Lung Cancer Journey book and my business card. No questions from the Tina Watkins or Michael at this time.   I delivered the completed Guardant360 paperwork to the lab at the conclusion of the encounter.

## 2023-01-14 ENCOUNTER — Other Ambulatory Visit: Payer: Self-pay

## 2023-01-15 ENCOUNTER — Other Ambulatory Visit: Payer: Self-pay | Admitting: Gastroenterology

## 2023-01-15 LAB — THYROID PANEL
Free Thyroxine Index: 2.3 (ref 1.2–4.9)
T3 Uptake Ratio: 28 % (ref 24–39)
T4, Total: 8.2 ug/dL (ref 4.5–12.0)

## 2023-01-15 LAB — HAPTOGLOBIN: Haptoglobin: 41 mg/dL (ref 33–346)

## 2023-01-15 NOTE — Progress Notes (Signed)
I was able to reach the pt to clarify what we had discussed at her consult appointment. At this time, until we have the results of her molecular studies back, we don't need to have a port appointment. If her results show no actionable mutations and it is determined that the pt does need chemotherapy, we can schedule a port closer to the date treatment would start. Pt verbalized understanding. Pt is limited M/W/F for appts or treatments as she has dialysis on T/Th/Sat. I reached out to IR scheduler via [facility], and first available port placement is on 12/31, however that is a Tuesday and pt has dialysis. The first available Monday appt is 1/6. Pt agrees to the port placement on 1/6. I reminded pt she will also need to have a chemo education class prior to starting treatment. I offered the pt an appt on 12/20 at 2pm or 4pm. Pt opted for 2pm on 12/20. I requested scheduler to place pt in the 12/20 at 2pm chemo education slot.  I let the IR scheduler know the pt can have the port done on 1/6.  I reached Dr Arlana Pouch via [facility] and the plan for the pt is for her to have her port done on 1/6 and to start chemo on 1/8. Dr Arlana Pouch would also like to see the pt prior to her treatment that day.

## 2023-01-16 ENCOUNTER — Other Ambulatory Visit (HOSPITAL_COMMUNITY): Payer: Self-pay | Admitting: Family Medicine

## 2023-01-19 ENCOUNTER — Encounter (HOSPITAL_COMMUNITY): Payer: Self-pay

## 2023-01-20 ENCOUNTER — Encounter (HOSPITAL_COMMUNITY): Payer: Self-pay

## 2023-01-20 ENCOUNTER — Other Ambulatory Visit: Payer: Self-pay | Admitting: Oncology

## 2023-01-20 DIAGNOSIS — C3412 Malignant neoplasm of upper lobe, left bronchus or lung: Secondary | ICD-10-CM

## 2023-01-20 MED ORDER — LIDOCAINE-PRILOCAINE 2.5-2.5 % EX CREA: TOPICAL_CREAM | CUTANEOUS | 3 refills | Status: DC

## 2023-01-20 MED ORDER — ONDANSETRON HCL 8 MG PO TABS: 8.0000 mg | ORAL_TABLET | Freq: Three times a day (TID) | ORAL | 1 refills | Status: DC | PRN

## 2023-01-20 MED ORDER — PROCHLORPERAZINE MALEATE 10 MG PO TABS: 10.0000 mg | ORAL_TABLET | Freq: Four times a day (QID) | ORAL | 1 refills | Status: DC | PRN

## 2023-01-20 MED ORDER — DEXAMETHASONE 4 MG PO TABS: ORAL_TABLET | ORAL | 1 refills | Status: DC

## 2023-01-21 ENCOUNTER — Encounter: Payer: Self-pay | Admitting: General Practice

## 2023-01-21 ENCOUNTER — Encounter (HOSPITAL_COMMUNITY): Payer: Self-pay | Admitting: *Deleted

## 2023-01-21 ENCOUNTER — Other Ambulatory Visit: Payer: Self-pay

## 2023-01-21 NOTE — Progress Notes (Signed)
[facility] Spiritual Care Note  Referred by nurse navigator Megan Bouchard/RN for additional layer of emotional support. Reached Ms Milhous by phone at an inconvenient time; she was appreciative of call, and we plan to try again today/tomorrow as schedules allow.   [address] [place], [place], [facility] Pager [phone] Voicemail [phone]

## 2023-01-22 ENCOUNTER — Inpatient Hospital Stay: Payer: Medicare Other

## 2023-01-22 ENCOUNTER — Encounter: Payer: Self-pay | Admitting: General Practice

## 2023-01-22 ENCOUNTER — Other Ambulatory Visit (HOSPITAL_COMMUNITY): Payer: Medicare Other

## 2023-01-22 ENCOUNTER — Ambulatory Visit (HOSPITAL_COMMUNITY): Payer: Medicare Other

## 2023-01-22 NOTE — Progress Notes (Signed)
[facility] Spiritual Care Note  Reached Liandra briefly by phone to introduce Spiritual Care as part of her support team. She is excited that her SO just completed his radiation treatment for prostate cancer, and she is looking forward to chemo ed shortly because she wants to know more about what to anticipate about her treatment experience.  We plan to meet in person at a future treatment once they're scheduled, and she knows how to reach chaplain in the meantime if needed/desired.   [address] Rush [place], [place], [facility] Pager [phone] Voicemail [phone]

## 2023-01-28 ENCOUNTER — Other Ambulatory Visit: Payer: Self-pay

## 2023-01-29 ENCOUNTER — Ambulatory Visit (HOSPITAL_COMMUNITY)
Admission: RE | Admit: 2023-01-29 | Discharge: 2023-01-29 | Disposition: A | Discharge status: Home / Self Care | Payer: Medicare Other | Source: Ambulatory Visit | Attending: Oncology | Admitting: Oncology

## 2023-01-29 ENCOUNTER — Encounter: Payer: Self-pay | Admitting: Oncology

## 2023-01-29 DIAGNOSIS — C3490 Malignant neoplasm of unspecified part of unspecified bronchus or lung: Secondary | ICD-10-CM | POA: Diagnosis present

## 2023-01-29 DIAGNOSIS — C3412 Malignant neoplasm of upper lobe, left bronchus or lung: Secondary | ICD-10-CM | POA: Diagnosis present

## 2023-01-29 MED ORDER — GADOBUTROL 1 MMOL/ML IV SOLN
5.0000 mL | Freq: Once | INTRAVENOUS | Status: AC | PRN
  Administered 2023-01-29: 5 mL via INTRAVENOUS

## 2023-01-29 NOTE — Progress Notes (Signed)
Pt notified that there is availability for her to see Dr Arlana Pouch on 1/10 at 9:30 and to start treatment following. I did note for the pt that she has a 9:30am that same day as a f/u to her ER visit. Pt states she will reschedule that appt. I let the pt know that she will need to come into the clinic at 9:00 on 1/10 for labs prior to her appt with Dr Arlana Pouch. Pt verbalized understanding.

## 2023-01-30 ENCOUNTER — Emergency Department (HOSPITAL_COMMUNITY)
Admission: EM | Admit: 2023-01-30 | Discharge: 2023-01-30 | Disposition: A | Discharge status: Home / Self Care | Payer: Medicare Other | Attending: Emergency Medicine | Admitting: Emergency Medicine

## 2023-01-30 ENCOUNTER — Other Ambulatory Visit: Payer: Self-pay

## 2023-01-30 ENCOUNTER — Encounter (HOSPITAL_COMMUNITY): Payer: Self-pay

## 2023-01-30 DIAGNOSIS — Z7901 Long term (current) use of anticoagulants: Secondary | ICD-10-CM | POA: Insufficient documentation

## 2023-01-30 DIAGNOSIS — D649 Anemia, unspecified: Secondary | ICD-10-CM | POA: Insufficient documentation

## 2023-01-30 DIAGNOSIS — K3184 Gastroparesis: Secondary | ICD-10-CM | POA: Diagnosis not present

## 2023-01-30 DIAGNOSIS — R7989 Other specified abnormal findings of blood chemistry: Secondary | ICD-10-CM | POA: Diagnosis present

## 2023-01-30 LAB — PROTIME-INR
INR: 1 (ref 0.8–1.2)
Prothrombin Time: 13.7 s (ref 11.4–15.2)

## 2023-01-30 LAB — CBC WITH DIFFERENTIAL/PLATELET
Abs Immature Granulocytes: 0.03 10*3/uL (ref 0.00–0.07)
Basophils Absolute: 0 10*3/uL (ref 0.0–0.1)
Basophils Relative: 0 %
Eosinophils Absolute: 0 10*3/uL (ref 0.0–0.5)
Eosinophils Relative: 0 %
HCT: 26.9 % — ABNORMAL LOW (ref 36.0–46.0)
Hemoglobin: 8.7 g/dL — ABNORMAL LOW (ref 12.0–15.0)
Immature Granulocytes: 1 %
Lymphocytes Relative: 8 %
Lymphs Abs: 0.6 10*3/uL — ABNORMAL LOW (ref 0.7–4.0)
MCH: 28.2 pg (ref 26.0–34.0)
MCHC: 32.3 g/dL (ref 30.0–36.0)
MCV: 87.1 fL (ref 80.0–100.0)
Monocytes Absolute: 0.3 10*3/uL (ref 0.1–1.0)
Monocytes Relative: 4 %
Neutro Abs: 5.8 10*3/uL (ref 1.7–7.7)
Neutrophils Relative %: 87 %
Platelets: 206 10*3/uL (ref 150–400)
RBC: 3.09 MIL/uL — ABNORMAL LOW (ref 3.87–5.11)
RDW: 18.4 % — ABNORMAL HIGH (ref 11.5–15.5)
WBC: 6.6 10*3/uL (ref 4.0–10.5)
nRBC: 0 % (ref 0.0–0.2)

## 2023-01-30 LAB — TYPE AND SCREEN
ABO/RH(D): B POS
Antibody Screen: NEGATIVE

## 2023-01-30 LAB — COMPREHENSIVE METABOLIC PANEL
ALT: 12 U/L (ref 0–44)
AST: 23 U/L (ref 15–41)
Albumin: 3.7 g/dL (ref 3.5–5.0)
Alkaline Phosphatase: 164 U/L — ABNORMAL HIGH (ref 38–126)
Anion gap: 15 (ref 5–15)
BUN: 14 mg/dL (ref 6–20)
CO2: 24 mmol/L (ref 22–32)
Calcium: 9.3 mg/dL (ref 8.9–10.3)
Chloride: 94 mmol/L — ABNORMAL LOW (ref 98–111)
Creatinine, Ser: 3.54 mg/dL — ABNORMAL HIGH (ref 0.44–1.00)
GFR, Estimated: 15 mL/min — ABNORMAL LOW (ref >60)
Glucose, Bld: 127 mg/dL — ABNORMAL HIGH (ref 70–99)
Potassium: 3.6 mmol/L (ref 3.5–5.1)
Sodium: 133 mmol/L — ABNORMAL LOW (ref 135–145)
Total Bilirubin: 0.4 mg/dL (ref <1.2)
Total Protein: 7.2 g/dL (ref 6.5–8.1)

## 2023-01-30 LAB — APTT: aPTT: 39 s — ABNORMAL HIGH (ref 24–36)

## 2023-01-30 MED ORDER — HYDROMORPHONE HCL 1 MG/ML IJ SOLN
2.0000 mg | Freq: Once | INTRAMUSCULAR | Status: AC
  Administered 2023-01-30: 2 mg via INTRAMUSCULAR
  Filled 2023-01-30: qty 2

## 2023-01-30 MED ORDER — ONDANSETRON 4 MG PO TBDP
4.0000 mg | ORAL_TABLET | Freq: Once | ORAL | Status: AC
  Administered 2023-01-30: 4 mg via ORAL
  Filled 2023-01-30: qty 1

## 2023-01-30 MED ORDER — METOCLOPRAMIDE HCL 5 MG/ML IJ SOLN
10.0000 mg | Freq: Once | INTRAMUSCULAR | Status: AC
  Administered 2023-01-30: 10 mg via INTRAMUSCULAR
  Filled 2023-01-30: qty 2

## 2023-01-30 NOTE — ED Provider Notes (Signed)
EMERGENCY DEPARTMENT AT [facility] Provider Note   CSN: [id number] Arrival date & time: 01/30/23  1612     History  Chief Complaint  Patient presents with   Abnormal Lab    Tina Watkins is a 57 y.o. female.  HPI 57 year old female presents with low hemoglobin. She had labwork drawn a couple days ago, and at dialysis today they told her she needed to come to the hospital for a blood transfusion due to a hemoglobin of 6.9.  No bleeding.  She got her dialysis today but has been vomiting due to gastroparesis.  She has some abdominal pain and vomiting that is similar to multiple prior episodes.  She states she was lightheaded when she got home from dialysis but otherwise has been feeling okay.  No syncope.  Currently is asking for IM injections as she does not want to wait on an IV and is a hard stick.  Home Medications Prior to Admission medications   Medication Sig Start Date End Date Taking? Authorizing Provider  allopurinol (ZYLOPRIM) 100 MG tablet Take 0.5 tablets (50 mg total) by mouth 2 (two) times a week. Patient taking differently: Take 100 mg by mouth daily in the afternoon. 12/24/22   Tawkaliyar, Roya, DO  amiodarone (PACERONE) 200 MG tablet Take 0.5 tablets (100 mg total) by mouth daily. 07/03/22   Eustace Pen, PA-C  apixaban (ELIQUIS) 2.5 MG TABS tablet Take 1 tablet (2.5 mg total) by mouth 2 (two) times daily. Okay to restart this medication on 11/18/2022 11/16/22   Leslye Peer, MD  Colchicine 0.6 MG CAPS Take 0.6 mg by mouth daily as needed (gout flare up). 01/12/22   Ivonne Andrew, NP  cyclobenzaprine (FLEXERIL) 5 MG tablet Take 1 tablet (5 mg total) by mouth at bedtime as needed. 08/21/22   Rice, Jamesetta Orleans, MD  dexamethasone (DECADRON) 4 MG tablet Take 2 tablets (8mg ) by mouth daily starting the day after carboplatin for 3 days. Take with food 01/20/23   Pasam, Avinash, MD  diclofenac Sodium (VOLTAREN) 1 % GEL Apply 4 g topically  as needed. 06/22/22   Ivonne Andrew, NP  diphenhydrAMINE (BENADRYL) 25 mg capsule Take 1 capsule (25 mg total) by mouth every 8 (eight) hours as needed. 07/21/19   Kallie Locks, FNP  dorzolamide-timolol (COSOPT) 2-0.5 % ophthalmic solution Place 1 drop into the left eye 2 (two) times daily. 12/13/21   [provider]  famotidine (PEPCID) 20 MG tablet TAKE 1 TABLET BY MOUTH TWICE A DAY 03/10/22   Ivonne Andrew, NP  fluticasone-salmeterol (ADVAIR HFA) 115-21 MCG/ACT inhaler Inhale 2 puffs into the lungs 2 (two) times daily. 04/28/21   Martina Sinner, MD  gabapentin (NEURONTIN) 100 MG capsule Take 100 mg by mouth 3 (three) times daily as needed (nerve pain).    [provider]  heparin sodium, porcine, 1000 UNIT/ML injection Inject 1,000 Units into the peritoneum once. 09/03/22 09/02/23  [provider]  iron sucrose (VENOFER) 20 MG/ML injection Inject 100 mg into the vein one time in dialysis. 09/12/22 11/18/23  [provider]  lidocaine (LIDODERM) 5 % Place 1 patch onto the skin daily as needed (for pain- remove old patch first).  10/28/18   [provider]  lidocaine-prilocaine (EMLA) cream Apply to affected area once 01/20/23   Pasam, Avinash, MD  metoCLOPramide (REGLAN) 10 MG tablet Take 1 tablet (10 mg total) by mouth 3 (three) times daily before meals. Patient  taking differently: Take 10 mg by mouth daily in the afternoon. 12/02/22   Meryl Dare, MD  midodrine (PROAMATINE) 10 MG tablet TAKE 1.5 TABLETS (15 MG TOTAL) BY MOUTH 3 (THREE) TIMES DAILY. NEEDS FOLLOW UP APPOINTMENT FOR ANYMORE REFILLS 01/18/23   Bensimhon, Bevelyn Buckles, MD  omeprazole (PRILOSEC) 40 MG capsule TAKE 1 CAPSULE (40 MG TOTAL) BY MOUTH DAILY. 01/15/23   Meryl Dare, MD  ondansetron (ZOFRAN) 8 MG tablet Take 1 tablet (8 mg total) by mouth every 8 (eight) hours as needed for nausea or vomiting. Start on the third day after carboplatin. 01/20/23   Pasam, Archie Patten, MD   oxyCODONE-acetaminophen (PERCOCET) 10-325 MG tablet Take 1 tablet by mouth 4 (four) times daily as needed for pain. 09/08/19   [provider]  prochlorperazine (COMPAZINE) 10 MG tablet Take 1 tablet (10 mg total) by mouth every 6 (six) hours as needed for nausea or vomiting. 01/20/23   Pasam, Avinash, MD  sertraline (ZOLOFT) 25 MG tablet Take 1 tablet (25 mg total) by mouth daily. 01/08/23   Ivonne Andrew, NP  VELPHORO 500 MG chewable tablet Taking 500 mg by mouth per meal or snack    [provider]  zolpidem (AMBIEN) 10 MG tablet Take 1 tablet (10 mg total) by mouth at bedtime as needed for sleep. 08/24/19   Sherryll Burger, Pratik D, DO      Allergies    Levofloxacin and Tape    Review of Systems   Review of Systems  Gastrointestinal:  Positive for abdominal pain, nausea and vomiting.  Neurological:  Negative for syncope.    Physical Exam Updated Vital Signs BP 100/71   Pulse 94   Temp 98.7 F (37.1 C) (Oral)   Resp (!) 22   Ht 4\' 11"  (1.499 m)   Wt 46.6 kg   SpO2 100%   BMI 20.75 kg/m  Physical Exam Vitals and nursing note reviewed.  Constitutional:      Appearance: She is well-developed. She is not diaphoretic.  HENT:     Head: Normocephalic and atraumatic.  Cardiovascular:     Rate and Rhythm: Normal rate and regular rhythm.     Heart sounds: Normal heart sounds.     Comments: HR ~100 Pulmonary:     Effort: Pulmonary effort is normal.     Breath sounds: Normal breath sounds.  Abdominal:     Palpations: Abdomen is soft.     Tenderness: There is abdominal tenderness.  Skin:    General: Skin is warm and dry.  Neurological:     Mental Status: She is alert.     ED Results / Procedures / Treatments   Labs (all labs ordered are listed, but only abnormal results are displayed) Labs Reviewed  CBC WITH DIFFERENTIAL/PLATELET - Abnormal; Notable for the following components:      Result Value   RBC 3.09 (*)    Hemoglobin 8.7 (*)    HCT 26.9 (*)    RDW  18.4 (*)    Lymphs Abs 0.6 (*)    All other components within normal limits  COMPREHENSIVE METABOLIC PANEL - Abnormal; Notable for the following components:   Sodium 133 (*)    Chloride 94 (*)    Glucose, Bld 127 (*)    Creatinine, Ser 3.54 (*)    Alkaline Phosphatase 164 (*)    GFR, Estimated 15 (*)    All other components within normal limits  APTT - Abnormal; Notable for the following components:   aPTT  39 (*)    All other components within normal limits  PROTIME-INR  TYPE AND SCREEN    EKG EKG Interpretation Date/Time:  Saturday January 30 2023 17:40:21 EST Ventricular Rate:  105 PR Interval:  184 QRS Duration:  78 QT Interval:  379 QTC Calculation: 501 R Axis:   79  Text Interpretation: Sinus tachycardia Abnormal R-wave progression, early transition Probable lateral infarct, age indeterminate HR is faster, otherwise similar to Jan 06 2023 Confirmed by Pricilla Loveless [phone]) on 01/30/2023 5:42:27 PM  Radiology No results found.  Procedures Procedures    Medications Ordered in ED Medications  ondansetron (ZOFRAN-ODT) disintegrating tablet 4 mg (4 mg Oral Given 01/30/23 1631)  metoCLOPramide (REGLAN) injection 10 mg (10 mg Intramuscular Given 01/30/23 1805)  HYDROmorphone (DILAUDID) injection 2 mg (2 mg Intramuscular Given 01/30/23 1822)  ondansetron (ZOFRAN-ODT) disintegrating tablet 4 mg (4 mg Oral Given 01/30/23 1905)    ED Course/ Medical Decision Making/ A&P Clinical Course as of 01/30/23 2313  Sat Jan 30, 2023  1756 I discussed with pharmacy, a one-time IM Reglan dose of 10 mg would be okay, even with her renal dysfunction. [SG]    Clinical Course User Index [SG] Pricilla Loveless, MD                                 Medical Decision Making Amount and/or Complexity of Data Reviewed Labs:     Details: Hemoglobin 8.7, baseline for her. ECG/medicine tests: independent interpretation performed.    Details: No acute ischemia.  Risk Prescription drug  management.   Patient's hemoglobin is actually baseline.   sounds like a false value at dialysis.  She is now also complaining of acute on chronic gastroparesis.  After treatment in the ED she is feeling better.  She did request IM treatment but now her vitals are reassuring and she feels better and well enough for discharge.  No indication for admission at this point.  Will discharge home with return precautions.        Final Clinical Impression(s) / ED Diagnoses Final diagnoses:  Chronic anemia  Gastroparesis    Rx / DC Orders ED Discharge Orders     None         Pricilla Loveless, MD 01/30/23 2321

## 2023-01-30 NOTE — ED Provider Triage Note (Signed)
Emergency Medicine Provider Triage Evaluation Note  Tina Watkins , a 57 y.o. female  was evaluated in triage.  Pt complains of symptomatic anemia.  Review of Systems  Positive:  Negative:   Physical Exam  There were no vitals taken for this visit. Gen:   Awake, no distress   Resp:  Normal effort  MSK:   Moves extremities without difficulty  Other:    Medical Decision Making  Medically screening exam initiated at 4:24 PM.  Appropriate orders placed.  Tina Watkins was informed that the remainder of the evaluation will be completed by another provider, this initial triage assessment does not replace that evaluation, and the importance of remaining in the ED until their evaluation is complete.  Received most of dialysis treatment today. Was told that their hgb was 6.9. was told to go to ED. Patient stating that she went home and loss consciousness while sitting on the couch and states that she was feeling dizzy. Denies hematochezia, hematuria, hematemesis. Endorses blood thinners.   Tina Watkins, [place] 01/30/23 1627

## 2023-01-30 NOTE — ED Triage Notes (Addendum)
Pt reports she was at dialysis today and was told her hgb is 6.9. She did get her full treatment today (dialysis Tu/Thur/Sat) and she reports she had a syncopal episode when she got home from dialysis. She was sitting on the couch when this happened so she did not fall. She reports weakness and shortness of breath. She takes Eliquis.

## 2023-01-30 NOTE — ED Notes (Signed)
Pt currently vomiting in triage;

## 2023-01-30 NOTE — Discharge Instructions (Signed)
Your anemia is at your baseline.  Take your meds as prescribed and go to dialysis as scheduled.  Get your hemoglobin rechecked with your primary care physician and/your dialysis.  If you develop worsening, continued, or recurrent abdominal pain, uncontrolled vomiting, fever, chest or back pain, or any other new/concerning symptoms then return to the ER for evaluation.

## 2023-02-02 ENCOUNTER — Encounter: Payer: Self-pay | Admitting: Oncology

## 2023-02-03 ENCOUNTER — Other Ambulatory Visit: Payer: Self-pay

## 2023-02-03 ENCOUNTER — Emergency Department (HOSPITAL_COMMUNITY): Payer: Medicare Other

## 2023-02-03 ENCOUNTER — Emergency Department (HOSPITAL_COMMUNITY)
Admission: EM | Admit: 2023-02-03 | Discharge: 2023-02-03 | Disposition: A | Discharge status: Home / Self Care | Payer: Medicare Other

## 2023-02-03 ENCOUNTER — Encounter (HOSPITAL_COMMUNITY): Payer: Self-pay

## 2023-02-03 DIAGNOSIS — Z992 Dependence on renal dialysis: Secondary | ICD-10-CM | POA: Diagnosis not present

## 2023-02-03 DIAGNOSIS — N186 End stage renal disease: Secondary | ICD-10-CM | POA: Diagnosis not present

## 2023-02-03 DIAGNOSIS — Z7901 Long term (current) use of anticoagulants: Secondary | ICD-10-CM | POA: Insufficient documentation

## 2023-02-03 DIAGNOSIS — R079 Chest pain, unspecified: Secondary | ICD-10-CM | POA: Diagnosis not present

## 2023-02-03 DIAGNOSIS — R0602 Shortness of breath: Secondary | ICD-10-CM | POA: Diagnosis present

## 2023-02-03 DIAGNOSIS — J449 Chronic obstructive pulmonary disease, unspecified: Secondary | ICD-10-CM | POA: Diagnosis not present

## 2023-02-03 DIAGNOSIS — I509 Heart failure, unspecified: Secondary | ICD-10-CM | POA: Insufficient documentation

## 2023-02-03 LAB — TROPONIN I (HIGH SENSITIVITY)
Troponin I (High Sensitivity): 17 ng/L (ref <18)
Troponin I (High Sensitivity): 15 ng/L (ref <18)

## 2023-02-03 LAB — CBC
HCT: 24.9 % — ABNORMAL LOW (ref 36.0–46.0)
Hemoglobin: 8.2 g/dL — ABNORMAL LOW (ref 12.0–15.0)
MCH: 28.4 pg (ref 26.0–34.0)
MCHC: 32.9 g/dL (ref 30.0–36.0)
MCV: 86.2 fL (ref 80.0–100.0)
Platelets: 186 10*3/uL (ref 150–400)
RBC: 2.89 MIL/uL — ABNORMAL LOW (ref 3.87–5.11)
RDW: 18.2 % — ABNORMAL HIGH (ref 11.5–15.5)
WBC: 5.1 10*3/uL (ref 4.0–10.5)
nRBC: 0 % (ref 0.0–0.2)

## 2023-02-03 LAB — BASIC METABOLIC PANEL
Anion gap: 18 — ABNORMAL HIGH (ref 5–15)
BUN: 38 mg/dL — ABNORMAL HIGH (ref 6–20)
CO2: 23 mmol/L (ref 22–32)
Calcium: 9.5 mg/dL (ref 8.9–10.3)
Chloride: 89 mmol/L — ABNORMAL LOW (ref 98–111)
Creatinine, Ser: 6.26 mg/dL — ABNORMAL HIGH (ref 0.44–1.00)
GFR, Estimated: 7 mL/min — ABNORMAL LOW (ref >60)
Glucose, Bld: 97 mg/dL (ref 70–99)
Potassium: 3.6 mmol/L (ref 3.5–5.1)
Sodium: 130 mmol/L — ABNORMAL LOW (ref 135–145)

## 2023-02-03 LAB — BRAIN NATRIURETIC PEPTIDE: B Natriuretic Peptide: 292.1 pg/mL — ABNORMAL HIGH (ref 0.0–100.0)

## 2023-02-03 MED ORDER — IOHEXOL 350 MG/ML SOLN
75.0000 mL | Freq: Once | INTRAVENOUS | Status: AC | PRN
  Administered 2023-02-03: 75 mL via INTRAVENOUS

## 2023-02-03 MED ORDER — HYDROMORPHONE HCL 1 MG/ML IJ SOLN
0.5000 mg | Freq: Once | INTRAMUSCULAR | Status: AC
  Administered 2023-02-03: 0.5 mg via INTRAVENOUS
  Filled 2023-02-03: qty 1

## 2023-02-03 MED ORDER — HYDRALAZINE HCL 20 MG/ML IJ SOLN
10.0000 mg | Freq: Once | INTRAMUSCULAR | Status: AC
  Administered 2023-02-03: 10 mg via INTRAVENOUS
  Filled 2023-02-03: qty 1

## 2023-02-03 MED ORDER — ONDANSETRON HCL 4 MG/2ML IJ SOLN
4.0000 mg | Freq: Once | INTRAMUSCULAR | Status: AC
  Administered 2023-02-03: 4 mg via INTRAVENOUS
  Filled 2023-02-03: qty 2

## 2023-02-03 MED ORDER — METOCLOPRAMIDE HCL 5 MG/ML IJ SOLN
5.0000 mg | Freq: Once | INTRAMUSCULAR | Status: AC
Start: 2023-02-03 — End: 2023-02-03
  Administered 2023-02-03: 5 mg via INTRAVENOUS
  Filled 2023-02-03: qty 2

## 2023-02-03 NOTE — Discharge Instructions (Signed)
 Please go to dialysis as planned.  Follow-up with your primary doctors.  Return if develop any fevers, chills, worsening chest pain, worsening shortness of breath, passout or develop any new or worsening symptoms that are concerning to you.

## 2023-02-03 NOTE — ED Triage Notes (Signed)
 Pt BIB [facility] from home d/t SOB starting yesterday & chest pressure that began this morning. Was recently diagnosed with Stage 4 lung CA on the Lt side (per EMS). A/Ox4, denies pain radiation, does have Hx of controlled A-Fib. Is dialysis pt, Lt groin access, no arm restriction. Reports she is to start CA Tx on the 8th this month. 130/80, 96% on RA, 80 bpm, 12 L unremarkable.

## 2023-02-03 NOTE — ED Notes (Signed)
 Walked pt. With pulse ox. Pt Spo2 is at 100% ambulating.

## 2023-02-03 NOTE — ED Provider Notes (Signed)
 [name] Provider Note   CSN: [id number] Arrival date & time: 02/03/23  9082     History  Chief Complaint  Patient presents with   Chest Pain   Shortness of Breath    Tina Watkins is a 58 y.o. female.  Presenting the emergency department for chest pain and shortness of breath.  Reports she was starting to have some chest discomfort yesterday evening.  She went to dialysis yesterday had partial session they stopped dialysis early due to her vomiting secondary to her gastroparesis.  She awoke this morning complaining of shortness of breath.  No fevers chills.  She is stating she is having her typical gastroparesis type abdominal pain.   Chest Pain Associated symptoms: shortness of breath   Shortness of Breath Associated symptoms: chest pain        Home Medications Prior to Admission medications   Medication Sig Start Date End Date Taking? Authorizing Provider  allopurinol  (ZYLOPRIM ) 100 MG tablet Take 0.5 tablets (50 mg total) by mouth 2 (two) times a week. Patient taking differently: Take 100 mg by mouth daily in the afternoon. 12/24/22   Tawkaliyar, Roya, DO  amiodarone  (PACERONE ) 200 MG tablet Take 0.5 tablets (100 mg total) by mouth daily. 07/03/22   Terra Fairy PARAS, PA-C  apixaban  (ELIQUIS ) 2.5 MG TABS tablet Take 1 tablet (2.5 mg total) by mouth 2 (two) times daily. Okay to restart this medication on 11/18/2022 11/16/22   Byrum, Robert S, MD  Colchicine  0.6 MG CAPS Take 0.6 mg by mouth daily as needed (gout flare up). 01/12/22   Oley Bascom RAMAN, NP  cyclobenzaprine  (FLEXERIL ) 5 MG tablet Take 1 tablet (5 mg total) by mouth at bedtime as needed. 08/21/22   Rice, Lonni ORN, MD  dexamethasone  (DECADRON ) 4 MG tablet Take 2 tablets (8mg ) by mouth daily starting the day after carboplatin  for 3 days. Take with food 01/20/23   Pasam, Avinash, MD  diclofenac  Sodium (VOLTAREN ) 1 % GEL Apply 4 g topically as needed. 06/22/22    Oley Bascom RAMAN, NP  diphenhydrAMINE  (BENADRYL ) 25 mg capsule Take 1 capsule (25 mg total) by mouth every 8 (eight) hours as needed. 07/21/19   Stroud, Natalie M, FNP  dorzolamide -timolol  (COSOPT ) 2-0.5 % ophthalmic solution Place 1 drop into the left eye 2 (two) times daily. 12/13/21   [provider]  famotidine  (PEPCID ) 20 MG tablet TAKE 1 TABLET BY MOUTH TWICE A DAY 03/10/22   Nichols, Tonya S, NP  fluticasone -salmeterol (ADVAIR  HFA) 115-21 MCG/ACT inhaler Inhale 2 puffs into the lungs 2 (two) times daily. 04/28/21   Kara Dorn NOVAK, MD  gabapentin  (NEURONTIN ) 100 MG capsule Take 100 mg by mouth 3 (three) times daily as needed (nerve pain).    [provider]  heparin  sodium, porcine, 1000 UNIT/ML injection Inject 1,000 Units into the peritoneum once. 09/03/22 09/02/23  [provider]  iron  sucrose (VENOFER) 20 MG/ML injection Inject 100 mg into the vein one time in dialysis. 09/12/22 11/18/23  [provider]  lidocaine  (LIDODERM ) 5 % Place 1 patch onto the skin daily as needed (for pain- remove old patch first).  10/28/18   [provider]  lidocaine -prilocaine  (EMLA ) cream Apply to affected area once 01/20/23   Pasam, Avinash, MD  metoCLOPramide  (REGLAN ) 10 MG tablet Take 1 tablet (10 mg total) by mouth 3 (three) times daily before meals. Patient taking differently: Take 10 mg by mouth daily in the afternoon. 12/02/22   Aneita,  Gwendlyn DASEN, MD  midodrine  (PROAMATINE ) 10 MG tablet TAKE 1.5 TABLETS (15 MG TOTAL) BY MOUTH 3 (THREE) TIMES DAILY. NEEDS FOLLOW UP APPOINTMENT FOR ANYMORE REFILLS 01/18/23   Bensimhon, Toribio SAUNDERS, MD  omeprazole  (PRILOSEC) 40 MG capsule TAKE 1 CAPSULE (40 MG TOTAL) BY MOUTH DAILY. 01/15/23   Aneita Gwendlyn DASEN, MD  ondansetron  (ZOFRAN ) 8 MG tablet Take 1 tablet (8 mg total) by mouth every 8 (eight) hours as needed for nausea or vomiting. Start on the third day after carboplatin . 01/20/23   Pasam, Avinash, MD  oxyCODONE -acetaminophen   (PERCOCET) 10-325 MG tablet Take 1 tablet by mouth 4 (four) times daily as needed for pain. 09/08/19   [provider]  prochlorperazine  (COMPAZINE ) 10 MG tablet Take 1 tablet (10 mg total) by mouth every 6 (six) hours as needed for nausea or vomiting. 01/20/23   Pasam, Avinash, MD  sertraline  (ZOLOFT ) 25 MG tablet Take 1 tablet (25 mg total) by mouth daily. 01/08/23   Oley Bascom RAMAN, NP  VELPHORO  500 MG chewable tablet Taking 500 mg by mouth per meal or snack    [provider]  zolpidem  (AMBIEN ) 10 MG tablet Take 1 tablet (10 mg total) by mouth at bedtime as needed for sleep. 08/24/19   Maree Bracken D, DO      Allergies    Levofloxacin  and Tape    Review of Systems   Review of Systems  Respiratory:  Positive for shortness of breath.   Cardiovascular:  Positive for chest pain.    Physical Exam Updated Vital Signs BP (!) 139/97 (BP Location: Right Arm)   Pulse 83   Temp 98.4 F (36.9 C) (Oral)   Resp (!) 22   Ht 4' 11 (1.499 m)   Wt 45.4 kg   SpO2 100%   BMI 20.20 kg/m  Physical Exam Vitals and nursing note reviewed.  Constitutional:      General: She is not in acute distress. HENT:     Head: Normocephalic.  Cardiovascular:     Rate and Rhythm: Normal rate and regular rhythm.  Pulmonary:     Effort: Tachypnea present. No respiratory distress.     Breath sounds: Examination of the left-upper field reveals decreased breath sounds. Examination of the left-middle field reveals decreased breath sounds. Examination of the left-lower field reveals decreased breath sounds. Decreased breath sounds present.  Abdominal:     Palpations: Abdomen is soft.     Tenderness: There is abdominal tenderness (mild). There is no guarding or rebound.  Musculoskeletal:     Right lower leg: No edema.     Left lower leg: No edema.  Skin:    General: Skin is warm.     Capillary Refill: Capillary refill takes less than 2 seconds.  Neurological:     Mental Status: She is alert and  oriented to person, place, and time.  Psychiatric:        Mood and Affect: Mood normal.        Behavior: Behavior normal.     ED Results / Procedures / Treatments   Labs (all labs ordered are listed, but only abnormal results are displayed) Labs Reviewed  CBC - Abnormal; Notable for the following components:      Result Value   RBC 2.89 (*)    Hemoglobin 8.2 (*)    HCT 24.9 (*)    RDW 18.2 (*)    All other components within normal limits  BASIC METABOLIC PANEL - Abnormal; Notable for the following components:  Sodium 130 (*)    Chloride 89 (*)    BUN 38 (*)    Creatinine, Ser 6.26 (*)    GFR, Estimated 7 (*)    Anion gap 18 (*)    All other components within normal limits  BRAIN NATRIURETIC PEPTIDE - Abnormal; Notable for the following components:   B Natriuretic Peptide 292.1 (*)    All other components within normal limits  TROPONIN I (HIGH SENSITIVITY)  TROPONIN I (HIGH SENSITIVITY)    EKG EKG Interpretation Date/Time:  Wednesday February 03 2023 09:42:43 EST Ventricular Rate:  86 PR Interval:  190 QRS Duration:  139 QT Interval:  395 QTC Calculation: 473 R Axis:   105  Text Interpretation: Sinus rhythm Right atrial enlargement Nonspecific intraventricular conduction delay Borderline repolarization abnormality Artifact in lead(s) I III aVR aVL aVF V1 V2 V3 V4 V5 V6 Confirmed by Neysa Clap [phone]) on 02/03/2023 10:52:15 AM  Radiology CT Angio Chest PE W and/or Wo Contrast Result Date: 02/03/2023 CLINICAL DATA:  Shortness of breath. History of lung cancer. * Tracking Code: BO * EXAM: CT ANGIOGRAPHY CHEST WITH CONTRAST TECHNIQUE: Multidetector CT imaging of the chest was performed using the standard protocol during bolus administration of intravenous contrast. Multiplanar CT image reconstructions and MIPs were obtained to evaluate the vascular anatomy. RADIATION DOSE REDUCTION: This exam was performed according to the departmental dose-optimization program which  includes automated exposure control, adjustment of the mA and/or kV according to patient size and/or use of iterative reconstruction technique. CONTRAST:  75mL OMNIPAQUE  IOHEXOL  350 MG/ML SOLN COMPARISON:  12/18/2022 FINDINGS: Cardiovascular: Satisfactory opacification of the pulmonary arteries to the segmental level. No evidence of pulmonary embolism. Web-like filling defect within the distal right main pulmonary artery is unchanged from previous exam and likely represents sequelae of chronic PE. Heart size appears normal. The main pulmonary artery measures 3.8 cm in diameter. Aortic atherosclerosis and coronary artery calcifications. Mediastinum/Nodes: Diffuse decreased AP diameter of the trachea and lower airways concerning for chronic tracheobronchomalacia. 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 There is a stent noted within the distal SVC. Retrograde opacification of the SVC via a dilated azygous vein is identified, image 146/7. Right paratracheal and subcarinal adenopathy identified. Right paratracheal nodal mass measures 2.6 cm, image 118/7. Previously 1.3 cm. Subcarinal lymph node measures 1.8 cm, image 173/7. Unchanged from previous exam. New right hilar adenopathy measures 1.2 cm, image 174/7. Progressive left-sided pre-vascular adenopathy is also identified. Index lymph node measures 1.8 cm, image 143/7. Formally 1.5 cm. Lungs/Pleura: There is complete occlusion of the left upper lobe airway with chronic consolidative change throughout the left upper lobe. Underlying neoplastic process is suspected. Moderate left effusion is increased from previous exam. There is been progressive atelectasis and volume loss from the left lower lobe. New small right pleural effusion is identified. Extensive pulmonary nodularity throughout the right lung is identified compatible with metastatic disease. This has progressed when  compared with the previous exam. Nodules are increased in multiplicity and size from previous exam. Additionally, there is new marked asymmetric interlobular septal thickening throughout the right lung which may reflect lymphangitic spread of tumor versus pulmonary edema or combination of both. Upper Abdomen: Multiple new low-density foci within the liver are concerning for metastatic disease. Index lesion within anterior dome of liver measures 0.9 cm, image 105/5. Central lobe of liver lesion measures 1.2 cm, image 120/5. No acute findings. Musculoskeletal: No chest wall abnormality. No  acute or significant osseous findings. Review of the MIP images confirms the above findings. IMPRESSION: 1. No evidence for acute pulmonary embolus. 2. 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 3. Progressive mediastinal and right hilar adenopathy compatible with progressive metastatic disease. 4. Extensive pulmonary nodularity throughout the right lung is identified compatible with metastatic disease. This has progressed when compared with the previous exam. Nodules are increased in multiplicity and size from previous exam. Additionally, there is new marked asymmetric interlobular septal thickening throughout the right lung which may reflect lymphangitic spread of tumor versus pulmonary edema or combination of both. 5. Moderate left effusion is increased from previous exam. There has been progressive atelectasis and volume loss from the left lower lobe. 6. 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 7. New small right pleural effusion. 8. Multiple new low-density foci within the liver are concerning for metastatic disease. 9. Diffuse decreased AP diameter of the trachea and lower airways concerning for chronic  tracheobronchomalacia. 10. Coronary artery calcifications. 11.  Aortic Atherosclerosis (ICD10-I70.0). Electronically Signed   By: Waddell Calk M.D.   On: 02/03/2023 13:11   DG Chest Portable 1 View Result Date: 02/03/2023 CLINICAL DATA:  Chest pain and shortness of breath. EXAM: PORTABLE CHEST 1 VIEW COMPARISON:  12/20/2022 FINDINGS: Stable cardiomediastinal contours scratch sec cardiomediastinal contours are obscured by pulmonary opacities. SVC stent is again seen. Persistent complete opacification of the left upper lobe. Progressive diffuse nodular interstitial opacities throughout both lungs. Which is concerning for progressive metastatic disease given the appearance of numerous tiny nodules noted on CT from 12/18/2022. IMPRESSION: 1. Persistent complete opacification of the left upper lobe. 2. Progressive diffuse nodular interstitial opacities throughout both lungs concerning for worsening metastatic disease. Electronically Signed   By: Waddell Calk M.D.   On: 02/03/2023 10:25    Procedures Procedures    Medications Ordered in ED Medications  hydrALAZINE  (APRESOLINE ) injection 10 mg (10 mg Intravenous Given 02/03/23 1102)  metoCLOPramide  (REGLAN ) injection 5 mg (5 mg Intravenous Given 02/03/23 1105)  HYDROmorphone  (DILAUDID ) injection 0.5 mg (0.5 mg Intravenous Given 02/03/23 1121)  iohexol  (OMNIPAQUE ) 350 MG/ML injection 75 mL (75 mLs Intravenous Contrast Given 02/03/23 1212)  ondansetron  (ZOFRAN ) injection 4 mg (4 mg Intravenous Given 02/03/23 1546)    ED Course/ Medical Decision Making/ A&P Clinical Course as of 02/03/23 1724  Wed Feb 03, 2023  9056 Per oncology note on 12/11: ELNORA QUIZON is a 58 y.o. lady with multiple medical comorbidities including kidney transplant, currently ESRD on HD, congestive heart failure, atrial fibrillation/flutter, COPD, depression, chronic gout, chronic pain, anemia of chronic disease, gastroparesis, midodrine  dependent hypotension, history of strokes,  suspected mesenteric ischemia, was recently diagnosed with malignant lung nodule on 11/16/2022 via bronchoscopy.  Immunostains suggestive of adenocarcinoma. [TY]  1104 CBC(!) Appears that patient's anemia is chronic and stable compared to prior values. [TY]  1623 Case discussed with internal medicine.  They came and evaluated patient.  After their discussion patient does not want to be admitted.  Internal medicine also states that does not necessarily need to be admitted as long as she can ambulate without desatting.  She did ambulate.  Did not desat.  Will discharge at this time. [TY]  1724 Patient ambulated without desaturation.  Continues to want to go home. Will discharge.  [TY]    Clinical  Course User Index [TY] Neysa Caron PARAS, DO                                 Medical Decision Making This is a 58 year old female with significant comorbidities to include prior kidney transplant now ESRD on dialysis CHF, A-fib, COPD, chronic pain and gastroparesis.  Presents today after a partial session of dialysis yesterday subsequently developed chest pain last night and shortness of breath this morning.  Per chart review recent diagnosis of lung cancer.  See ED course for oncology note.  She is hypertensive here, nontachycardic, maintaining oxygen saturation on room air.  She is tachypneic, does not appear to be in overt respiratory distress.  Decreased breath sounds on the left as compared to right.  Initial EKG does not appear to have ST segment changes consistent with STEMI.  Will get chest x-ray, broad cardiopulmonary screening labs.  Given recent diagnosis of lung cancer concern for PE versus worsening malignancy.  Will get CTA to evaluate.  Patient placed on oxygen for comfort per her request.  Given hydralazine  for BP.   See ED course for further MDM/Dispositon   Amount and/or Complexity of Data Reviewed Independent Historian:     Details: Fianc notes partial treatment of dialysis  yesterday. External Data Reviewed:     Details: See ED course Labs: ordered. Decision-making details documented in ED Course. Radiology: ordered. Decision-making details documented in ED Course. ECG/medicine tests: ordered. Decision-making details documented in ED Course.  Risk Prescription drug management. Decision regarding hospitalization. Diagnosis or treatment significantly limited by social determinants of health. Risk Details: Poor health literacy          Final Clinical Impression(s) / ED Diagnoses Final diagnoses:  Shortness of breath    Rx / DC Orders ED Discharge Orders     None         Neysa Caron PARAS, DO 02/03/23 1724

## 2023-02-03 NOTE — ED Notes (Signed)
 Admitting at bedside

## 2023-02-04 ENCOUNTER — Other Ambulatory Visit: Payer: Self-pay | Admitting: Radiology

## 2023-02-04 LAB — ACID FAST CULTURE WITH REFLEXED SENSITIVITIES (MYCOBACTERIA): Acid Fast Culture: NEGATIVE

## 2023-02-04 LAB — GUARDANT 360

## 2023-02-08 ENCOUNTER — Other Ambulatory Visit: Payer: Self-pay | Admitting: Oncology

## 2023-02-08 ENCOUNTER — Ambulatory Visit (HOSPITAL_COMMUNITY): Payer: Medicare Other

## 2023-02-08 ENCOUNTER — Ambulatory Visit (HOSPITAL_COMMUNITY)
Admission: RE | Admit: 2023-02-08 | Discharge: 2023-02-08 | Disposition: A | Discharge status: Home / Self Care | Payer: Medicare Other | Source: Ambulatory Visit | Attending: Oncology

## 2023-02-08 DIAGNOSIS — D631 Anemia in chronic kidney disease: Secondary | ICD-10-CM

## 2023-02-08 DIAGNOSIS — K869 Disease of pancreas, unspecified: Secondary | ICD-10-CM

## 2023-02-08 DIAGNOSIS — C3412 Malignant neoplasm of upper lobe, left bronchus or lung: Secondary | ICD-10-CM

## 2023-02-08 DIAGNOSIS — F17218 Nicotine dependence, cigarettes, with other nicotine-induced disorders: Secondary | ICD-10-CM

## 2023-02-08 DIAGNOSIS — I871 Compression of vein: Secondary | ICD-10-CM | POA: Insufficient documentation

## 2023-02-08 DIAGNOSIS — D649 Anemia, unspecified: Secondary | ICD-10-CM

## 2023-02-08 HISTORY — PX: IR VENO/EXT/UNI LEFT: IMG675

## 2023-02-08 MED ORDER — HEPARIN SOD (PORK) LOCK FLUSH 100 UNIT/ML IV SOLN
INTRAVENOUS | Status: AC
Start: 2023-02-08 — End: ?
  Filled 2023-02-08: qty 5

## 2023-02-08 MED ORDER — LIDOCAINE HCL 1 % IJ SOLN
20.0000 mL | Freq: Once | INTRAMUSCULAR | Status: AC
  Administered 2023-02-08: 5 mL via INTRADERMAL

## 2023-02-08 MED ORDER — LIDOCAINE HCL 1 % IJ SOLN
INTRAMUSCULAR | Status: AC
  Filled 2023-02-08: qty 20

## 2023-02-08 MED ORDER — HEPARIN SOD (PORK) LOCK FLUSH 100 UNIT/ML IV SOLN
500.0000 [IU] | Freq: Once | INTRAVENOUS | Status: AC
  Administered 2023-02-08: 500 [IU] via INTRAVENOUS

## 2023-02-08 MED ORDER — IOHEXOL 300 MG/ML  SOLN
50.0000 mL | Freq: Once | INTRAMUSCULAR | Status: AC | PRN
  Administered 2023-02-08: 10 mL via INTRAVENOUS

## 2023-02-08 NOTE — Procedures (Signed)
 Vascular and Interventional Radiology Procedure Note  Patient: JAYDN FINCHER DOB: 1966/01/19 Medical Record Number: [id number] Note Date/Time: 02/08/23 2:53 PM   Performing Physician: Thom Hall, MD Assistant(s): None  Diagnosis: Lung cancer  Procedure: PERIPHERALLY INSERTED CENTRAL VENOUS (Mildine) CATHETER PLACEMENT  Anesthesia: Local Anesthetic Complications: None Estimated Blood Loss: Minimal  Findings:  Successful midline catheter placement, via LEFT basilic vein with the tip of the catheter in the axillary vein  Plan: Catheter ready for use.  See detailed procedure note with images in PACS. The patient tolerated the procedure well without incident or complication and was returned to Recovery in stable condition.    Thom Hall, MD [facility]   Pager. [phone] Clinic. [phone]

## 2023-02-09 ENCOUNTER — Encounter: Payer: Self-pay | Admitting: Oncology

## 2023-02-09 ENCOUNTER — Other Ambulatory Visit: Payer: Self-pay | Admitting: Oncology

## 2023-02-09 NOTE — Progress Notes (Signed)
 9:15: Pt called me at this time to let me know that she is having a hard time breathing and that her chemotherapy still hasn't been scheduled for this Friday. Pt states that the recent CT they did (on 1/1) showed that the cancer has gotten worse and its making it hard for her to breath. I explained that her HF clinic appt still hadn't been canceled. Pt states she did cancel it on MyChart, but the appt remains in EPIC. Pt was currently at dialysis and she was audibly having trouble breathing. The nurses at her dialysis clinic has to be oxygen on her which made her more comfortable. Pt states she needs home oxygen. The pt recently went to the ER for CP and SOB and I suggested she report there after her dialysis if she is as distressed as she was last week. Pt states she almost went this morning.  I was in a [place] with Dr Autumn and I shared my concerns with him and he supports an evaluation by the ER.  9:32: I reached out to the [facility] HF clinic to let them know the pt needs to start her treatment on Friday 1/10 and won't be able to make that appt. Scheduler states they will reach out to her to reschedule.  9:44 I called the pt back to let her know that Dr Autumn would support an evaluation by the ER. Pt states all they're going to do is put oxygen on me and see that my oxygen is 100% I continued to encourage her to visit the ER and to call me if she does.  9:56 I called the pt again to let her know she will have a lab appt at 9:00, an appt with Dr Autumn at 9:30, and treatment at 10:00. Pt verbalized understanding of information provided.

## 2023-02-10 ENCOUNTER — Telehealth: Payer: Self-pay | Admitting: Oncology

## 2023-02-10 NOTE — Progress Notes (Deleted)
Office Visit Note  Patient: Tina Watkins             Date of Birth: 03-26-1965           MRN: [id number]             PCP: Ivonne Andrew, NP Referring: Ivonne Andrew, NP Visit Date: 02/24/2023   Subjective:  No chief complaint on file.   History of Present Illness: Tina Watkins is a 58 y.o. female here for follow up for chronic gout on allopurinol, osteoarthritis, and chronic rotator cuff arthropathy.    Previous HPI 08/21/2022 Tina Watkins is a 58 y.o. female here for follow up for chronic gout on allopurinol, osteoarthritis, and chronic rotator cuff arthropathy. She resumed working with physical therapy and making partial increase in ROM but still very painful in right shoulder. Her mobility is fair but has a lot of hip pain worse getting up each time. Inability to lift or reach overhead impairing her ADLs considerably. She is being assisted with full time line-in aide who also provides her transportation to hemodialysis. Needs paperwork completed today for her housing accommodations.      Previous HPI 03/09/2022 Tina Watkins is a 58 y.o. female here for follow up with joint pain in multiple areas current exacerbation doing worse with right shoulder and left hip pain. Chronic gout on allopurinol appears well controlled.  Hip is painful when getting up or down from seated position or lying on her side.  Most noticeable while trying to sleep occasionally waking to reposition.  She does not have any hip pain with weightbearing when she is up and walking.  Also having worsening again with the right shoulder pain.  Previous bilateral shoulder injections last year only helped on the left side.  She had a lot of improvement in range of motion on the right side the after the physical therapy course.    Previous HPI 01/14/22 Tina Watkins is a 58 y.o. female here for follow up for chronic joint pain with history of neuropathy, gout, osteoarthritis, and possible  fibromyalgia syndrome. At last visit no new flare ups of inflammatory arthritis. We tried bilateral shoulder subacromial bursa steroid injections. She had some pain relief after the injection but did feel like the pain came back again similar as before.  She started physical therapy as referred by her orthopedist has had 5 or 6 sessions so far and is noticing an improvement with the shoulder range of motion and stiffness.  She followed up with them this morning with recommendation to continue therapy for now and plan follow-up in about February for repeat shoulder injection.  She has not had any new flareups of gout.  She is having some increase in lateral hip pain this is most noticeable when lying on the bed trying to sleep.  Rarely awakens her from sleep.  She is doing some stationary biking exercise with the physical therapist but no specific range of motion exercises for her hips.   Previous HPI 09/26/21 Tina Watkins is a 58 y.o. female here for follow up or evaluation of pain with history of neuropathy, gout, osteoarthritis, and possible fibromyalgia syndrome.  She is not experiencing any major flareups with the toe inflammation or swelling since her last visit.  Does still continue having considerable pain on a daily basis at multiple sites including lower extremities hands shoulders neck and back.  Bilateral shoulder pains were improved with the steroid injections at her  last visit but has experienced worsening again with stiffness and difficulty in range of motion.   Previous HPI 07/07/2021 Tina Watkins is a 58 y.o. female here for follow up for evaluation of pain with history of neuropathy, gout, osteoarthritis, and possible fibromyalgia syndrome.  At initial visit uric acid level was 3.2 suspect gout is well controlled at this time. Imaging of hands and shoulders did not show significant shoulder arthritis but hands with chronic changes including probable small distal erosions. She  continues to have a lot of pain also feeling numbness and swelling involving both hands. She is unable to easily reach up or behind her back, also with increased pain lying on her side provokes worse numbness to that side.   Previous HPI 06/06/2021 Tina Watkins is a 58 y.o. female here for evaluation of pain with history of neuropathy, gout, osteoarthritis, and possible fibromyalgia syndrome. 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 She has joint pain and stiffness in a lot of areas. Muscle pains in neck and upper back.  Large joint pains have been bothering her for years varying in severity.  Shoulders and back are doing worse particularly in the past month.  Episodic gout flares have caused severe pain in first MTP joints.  Most recently gout flare in the left great toe about a week ago improved with colchicine treatment.  However on a routine basis having more symptoms in the bilateral hands and shoulders.  She was started on duloxetine 30 mg for lower extremity neuropathy pain. On allopurinol 100 mg daily for gout. Previous knee xray and MRI findings with chondrocalcinosis and bone infarcts. Right hip imaging with AVN.   No Rheumatology ROS completed.   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   Lesion of pancreas 12/23/2022   Multiple lung nodules 12/23/2022   Hepatic cirrhosis (HCC) - radiographic finding 12/19/2022   Pulmonary hypertension (HCC)- radiographic finding; R heart cath unsuccessful 12/19/2022   Shortness of breath 12/18/2022   Lung cancer, upper lobe (HCC) 10/28/2022   Encounter for monitoring amiodarone therapy 07/01/2022   Atrial fibrillation (HCC) 07/01/2022   Bilateral hip pain 01/14/2022   Bilateral shoulder pain 06/06/2021   Bilateral hand swelling 06/06/2021   Cervical radiculitis 01/24/2021    Acute respiratory failure with hypoxia (HCC) 10/23/2020   Coagulation defect, unspecified (HCC) 08/01/2020   Aortic atherosclerosis (HCC) 07/10/2020   Hypercoagulable state due to atrial fibrillation (HCC) 04/23/2020   Atrial flutter (HCC) 03/29/2020   Chronic pain of both knees 03/13/2020   Other pancytopenia (HCC)    Pneumonia of left upper lobe due to infectious organism 08/05/2019   History of COVID-19, most recently 09/2022 05/09/2019   Hypotension 05/09/2019   CHF (congestive heart failure) (HCC) 05/09/2019   DDD (degenerative disc disease), cervical 12/06/2018   Neuropathy 12/06/2018   Tobacco abuse 11/20/2017   History of CVA (cerebrovascular accident) 11/20/2017   GERD (gastroesophageal reflux disease) 11/20/2017   History of deep vein thrombosis (DVT) of right lower extremity 11/20/2017   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    Cervical stenosis of spinal canal 01/08/2015   Anemia in chronic kidney disease 06/15/2014   History of kidney transplant    Chronic pain syndrome 04/09/2012   Gout 06/23/2011   Herpes infection 06/23/2011   Anxiety 06/23/2011  ESRD (end stage renal disease) (HCC) 06/12/2011   History of renal transplantation 05/22/2011   Gastroparesis 04/24/2008    Past Medical History:  Diagnosis Date   Anxiety    Bacteremia due to Gram-negative bacteria 05/23/2011   Blind    right eye   Blind right eye    CHF (congestive heart failure) (HCC)    Chronic lower back pain    Complication of anesthesia    "woke up during OR; I have an extremely high tolerance" (12/11/2011) 1 procedure was graft; the other procedures were procedures that are typically done with sedation.   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   Dysrhythmia    "tachycardia" (12/11/2011) new onset afib 10/15/14 EKG   E coli bacteremia 06/18/2011   Elevated LDL cholesterol level 12/2018   ESRD  (end stage renal disease) (HCC) 06/12/2011   Tues-Thurs-Sat dialysis   Fibromyalgia    Gastroparesis    GERD (gastroesophageal reflux disease)    Glaucoma    right eye   Gout    H/O carpal tunnel syndrome    Headache 10/2019   no current problem per patient on 11/12/22   Headache(784.0)    "not often anymore" (12/11/2011)   Herpes genitalia 1994   History of blood transfusion    "more than a few times" (12/11/2011)   History of COVID-19, most recently 09/2022 05/09/2019   History of deep vein thrombosis (DVT) of right lower extremity 11/20/2017   History of stomach ulcers    Hypotension    Iron deficiency anemia    New onset a-fib (HCC)    10/15/14 EKG   Osteopenia    Peripheral neuropathy 11/2018   Pneumonia    x several but years ago   Pressure ulcer of sacral region, stage 1 07/2019   Seizures (HCC) 1994   "post transplant; only have had that one" (12/11/2011)   Spinal stenosis in cervical region    Stroke [facility])     left basal ganglia lacunar infarct; Right frontal lobe lacunar infarct.   Stroke [facility]) ~ 1999; 2001   "briefly lost my vision; lost my right eye" (12/11/2011)   Vitamin D deficiency 12/2018    Family History  Problem Relation Age of Onset   Glaucoma Mother    Pancreatic cancer Father    Multiple sclerosis Brother    Diabetes Maternal Uncle    Hypertension Maternal Grandmother    Breast cancer Neg Hx    Past Surgical History:  Procedure Laterality Date   ANTERIOR CERVICAL DECOMP/DISCECTOMY FUSION N/A 01/08/2015   Procedure: Anterior Cervical Three-Four/Four-Five Decompression/Diskectomy/Fusion;  Surgeon: Hilda Lias, MD;  Location: [facility] NEURO ORS;  Service: Neurosurgery;  Laterality: N/A;  C3-4 C4-5 Anterior cervical decompression/diskectomy/fusion   APPENDECTOMY  ~ 2004   BIOPSY  09/12/2020   Procedure: BIOPSY;  Surgeon: Rachael Fee, MD;  Location: [facility] ENDOSCOPY;  Service: Endoscopy;;   BRONCHIAL BIOPSY  11/16/2022   Procedure: BRONCHIAL BIOPSIES;   Surgeon: Leslye Peer, MD;  Location: [facility] ENDOSCOPY;  Service: Pulmonary;;   BRONCHIAL BRUSHINGS  11/16/2022   Procedure: BRONCHIAL BRUSHINGS;  Surgeon: Leslye Peer, MD;  Location: [facility] ENDOSCOPY;  Service: Pulmonary;;   BRONCHIAL NEEDLE ASPIRATION BIOPSY  11/16/2022   Procedure: BRONCHIAL NEEDLE ASPIRATION BIOPSIES;  Surgeon: Leslye Peer, MD;  Location: [facility] ENDOSCOPY;  Service: Pulmonary;;   CATARACT EXTRACTION     right eye   COLONOSCOPY     COLONOSCOPY WITH PROPOFOL  N/A 09/12/2020   Procedure: COLONOSCOPY WITH PROPOFOL;  Surgeon: Rachael Fee, MD;  Location: [facility] ENDOSCOPY;  Service: Endoscopy;  Laterality: N/A;   ENUCLEATION  2001   "right"   ESOPHAGOGASTRODUODENOSCOPY (EGD) WITH PROPOFOL N/A 04/21/2012   Procedure: ESOPHAGOGASTRODUODENOSCOPY (EGD) WITH PROPOFOL;  Surgeon: Rachael Fee, MD;  Location: [facility] ENDOSCOPY;  Service: Endoscopy;  Laterality: N/A;   ESOPHAGOGASTRODUODENOSCOPY (EGD) WITH PROPOFOL N/A 09/12/2020   Procedure: ESOPHAGOGASTRODUODENOSCOPY (EGD) WITH PROPOFOL;  Surgeon: Rachael Fee, MD;  Location: [facility] ENDOSCOPY;  Service: Endoscopy;  Laterality: N/A;   FIDUCIAL MARKER PLACEMENT  11/16/2022   Procedure: FIDUCIAL MARKER PLACEMENT;  Surgeon: Leslye Peer, MD;  Location: [facility] ENDOSCOPY;  Service: Pulmonary;;   HEMOSTASIS CONTROL  11/16/2022   Procedure: HEMOSTASIS CONTROL;  Surgeon: Leslye Peer, MD;  Location: [facility] ENDOSCOPY;  Service: Pulmonary;;   INSERTION OF DIALYSIS CATHETER  1988   "AV graft LUA & LFA; LUA worked for 1 day; LFA never worked"   IR FLUORO GUIDE CV LINE RIGHT  07/11/2019   IR FLUORO GUIDE CV LINE RIGHT  10/20/2019   IR FLUORO GUIDE CV LINE RIGHT  05/31/2020   IR PTA VENOUS EXCEPT DIALYSIS CIRCUIT  05/31/2020   IR RADIOLOGY PERIPHERAL GUIDED IV START  07/11/2019   IR US GUIDE VASC ACCESS RIGHT  07/11/2019   IR US GUIDE VASC ACCESS RIGHT  07/11/2019   IR US GUIDE VASC ACCESS RIGHT  07/11/2019   IR VENO/EXT/UNI LEFT  02/08/2023   IR VENOCAVAGRAM IVC   05/31/2020   KIDNEY TRANSPLANT  1994; 1999; 2005   "right"   MULTIPLE TOOTH EXTRACTIONS     POLYPECTOMY  09/12/2020   Procedure: POLYPECTOMY;  Surgeon: Rachael Fee, MD;  Location: [facility] ENDOSCOPY;  Service: Endoscopy;;   RIGHT HEART CATH N/A 03/23/2019   Procedure: RIGHT HEART CATH;  Surgeon: Dolores Patty, MD;  Location: [facility] INVASIVE CV LAB;  Service: Cardiovascular;  Laterality: N/A;   TONSILLECTOMY     TOTAL NEPHRECTOMY  1988?; 1994; 2005   VIDEO BRONCHOSCOPY WITH RADIAL ENDOBRONCHIAL ULTRASOUND  11/16/2022   Procedure: VIDEO BRONCHOSCOPY WITH RADIAL ENDOBRONCHIAL ULTRASOUND;  Surgeon: Leslye Peer, MD;  Location: [facility] ENDOSCOPY;  Service: Pulmonary;;   Social History   Social History Narrative   Right-handed.   Four cups caffeine per day.   Lives alone.   Immunization History  Administered Date(s) Administered   Hepatitis B, PED/ADOLESCENT 07/20/2019, 08/29/2019, 09/21/2019   Hepb-cpg 07/20/2019, 08/29/2019, 09/21/2019, 11/23/2019, 08/13/2022   Influenza Split 11/07/2019   Influenza, Quadrivalent, Recombinant, Inj, Pf 11/11/2021   Influenza,inj,Quad PF,6+ Mos 11/07/2019, 11/07/2020   PFIZER Comirnaty(Gray Top)Covid-19 Tri-Sucrose Vaccine 07/29/2020   PFIZER(Purple Top)SARS-COV-2 Vaccination 05/31/2019, 06/21/2019, 10/26/2019   PNEUMOCOCCAL CONJUGATE-20 06/04/2022   PPD Test 08/10/2017   Pfizer Covid-19 Vaccine Bivalent Booster 25yrs & up 02/06/2021     Objective: Vital Signs: There were no vitals taken for this visit.   Physical Exam   Musculoskeletal Exam: ***  CDAI Exam: CDAI Score: -- Patient Global: --; Provider Global: -- Swollen: --; Tender: -- Joint Exam 02/24/2023   No joint exam has been documented for this visit   There is currently no information documented on the homunculus. Go to the Rheumatology activity and complete the homunculus joint exam.  Investigation: No additional findings.  Imaging: IR PICC PLACEMENT LEFT >5 YRS INC IMG  GUIDE Result Date: 02/08/2023 INDICATION: PICC PLACEMENT; New lung cancer dx. Needs chemo Briefly, 58 year old female with a history of ESRD on HD via a RIGHT  femoral catheter. No central stenoses with SVC stent and significant RIGHT chest wall collaterals. Patient previously recommended for port catheter, however no viable central venous access. Midline catheter recommended after provider discussion. EXAM: Procedures: 1. LEFT UPPER EXTREMITY VENOGRAM 2. ULTRASOUND AND FLUOROSCOPIC GUIDED PICC LINE INSERTION MEDICATIONS: None CONTRAST:  10 mL Omnipaque 300 intravenous contrast ANESTHESIA/SEDATION: Local anesthetic was administered. The patient was continuously monitored during the procedure by the interventional radiology nurse under my direct supervision. FLUOROSCOPY TIME:  Fluoroscopic dose; 3 mGy COMPLICATIONS: None immediate. TECHNIQUE: The procedure, risks, benefits, and alternatives were explained to the patient and/or patient's representative and informed written consent was obtained. A timeout was performed prior to the initiation of the procedure. The LEFT upper extremity was prepped with chlorhexidine in a sterile fashion, and a sterile drape was applied covering the operative field. Maximum barrier sterile technique with sterile gowns and gloves were used for the procedure. A timeout was performed prior to the initiation of the procedure. Local anesthesia was provided with 1% lidocaine. Under direct ultrasound guidance, the LEFT basilic vein was accessed with a micropuncture kit after the overlying soft tissues were anesthetized with 1% lidocaine. An ultrasound image was saved for documentation purposes. A LEFT upper extremity venogram was then performed, demonstrating patent basilic vein to the level of the thoracic inlet. A guidewire was advanced to the level of the superior caval-atrial junction for measurement purposes and the central catheter line was cut to length. A peel-away sheath was placed and  a 21 cm, 5 Fr, single lumen was inserted to level of the axillary vein. A post procedure spot fluoroscopic was obtained. The catheter easily aspirated and flushed and was sutured in place. A dressing was placed. The patient tolerated the procedure well without immediate post procedural complication. FINDINGS: *LEFT upper extremity venogram with central venous obstruction at the level of the thoracic inlet. Occluded LEFT innominate vein, with collateral venous drainage into the RIGHT atrium via the hemi-azygous veins. *After catheter placement, the midline catheter tip lies within the axillary vein the catheter aspirates and flushes normally and is ready for immediate use. IMPRESSION: 1. 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 2. Successful ultrasound and fluoroscopic guided placement of a LEFT basilic vein-approach, 21 cm, 5 Fr single lumen midline catheter with tip at the axillary vein. The central line is ready for immediate use. Roanna Banning, MD [facility] Radiology Electronically Signed   By: Roanna Banning M.D.   On: 02/08/2023 17:59   IR Veno/Ext/Uni Left Result Date: 02/08/2023 INDICATION: PICC PLACEMENT; New lung cancer dx. Needs chemo Briefly, 58 year old female with a history of ESRD on HD via a RIGHT femoral catheter. No central stenoses with SVC stent and significant RIGHT chest wall collaterals. Patient previously recommended for port catheter, however no viable central venous access. Midline catheter recommended after provider discussion. EXAM: Procedures: 1. LEFT UPPER EXTREMITY VENOGRAM 2. ULTRASOUND AND FLUOROSCOPIC GUIDED PICC LINE INSERTION MEDICATIONS: None CONTRAST:  10 mL Omnipaque 300 intravenous contrast ANESTHESIA/SEDATION: Local anesthetic was administered. The patient was continuously monitored during the procedure by the interventional radiology nurse under my direct supervision. FLUOROSCOPY TIME:   Fluoroscopic dose; 3 mGy COMPLICATIONS: None immediate. TECHNIQUE: The procedure, risks, benefits, and alternatives were explained to the patient and/or patient's representative and informed written consent was obtained. A timeout was performed prior to the initiation of the procedure. The LEFT upper extremity was prepped with chlorhexidine in a sterile fashion, and a sterile drape was  applied covering the operative field. Maximum barrier sterile technique with sterile gowns and gloves were used for the procedure. A timeout was performed prior to the initiation of the procedure. Local anesthesia was provided with 1% lidocaine. Under direct ultrasound guidance, the LEFT basilic vein was accessed with a micropuncture kit after the overlying soft tissues were anesthetized with 1% lidocaine. An ultrasound image was saved for documentation purposes. A LEFT upper extremity venogram was then performed, demonstrating patent basilic vein to the level of the thoracic inlet. A guidewire was advanced to the level of the superior caval-atrial junction for measurement purposes and the central catheter line was cut to length. A peel-away sheath was placed and a 21 cm, 5 Fr, single lumen was inserted to level of the axillary vein. A post procedure spot fluoroscopic was obtained. The catheter easily aspirated and flushed and was sutured in place. A dressing was placed. The patient tolerated the procedure well without immediate post procedural complication. FINDINGS: *LEFT upper extremity venogram with central venous obstruction at the level of the thoracic inlet. Occluded LEFT innominate vein, with collateral venous drainage into the RIGHT atrium via the hemi-azygous veins. *After catheter placement, the midline catheter tip lies within the axillary vein the catheter aspirates and flushes normally and is ready for immediate use. IMPRESSION: 1. 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 2. Successful  ultrasound and fluoroscopic guided placement of a LEFT basilic vein-approach, 21 cm, 5 Fr single lumen midline catheter with tip at the axillary vein. The central line is ready for immediate use. Roanna Banning, MD [facility] Radiology Electronically Signed   By: Roanna Banning M.D.   On: 02/08/2023 17:59   CT Angio Chest PE W and/or Wo Contrast Result Date: 02/03/2023 CLINICAL DATA:  Shortness of breath. History of lung cancer. * Tracking Code: BO * EXAM: CT ANGIOGRAPHY CHEST WITH CONTRAST TECHNIQUE: Multidetector CT imaging of the chest was performed using the standard protocol during bolus administration of intravenous contrast. Multiplanar CT image reconstructions and MIPs were obtained to evaluate the vascular anatomy. RADIATION DOSE REDUCTION: This exam was performed according to the departmental dose-optimization program which includes automated exposure control, adjustment of the mA and/or kV according to patient size and/or use of iterative reconstruction technique. CONTRAST:  75mL OMNIPAQUE IOHEXOL 350 MG/ML SOLN COMPARISON:  12/18/2022 FINDINGS: Cardiovascular: Satisfactory opacification of the pulmonary arteries to the segmental level. No evidence of pulmonary embolism. Web-like filling defect within the distal right main pulmonary artery is unchanged from previous exam and likely represents sequelae of chronic PE. Heart size appears normal. The main pulmonary artery measures 3.8 cm in diameter. Aortic atherosclerosis and coronary artery calcifications. Mediastinum/Nodes: Diffuse decreased AP diameter of the trachea and lower airways concerning for chronic tracheobronchomalacia. 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 There is a stent noted within the distal SVC. Retrograde opacification of the SVC via a dilated azygous vein is identified, image 146/7. Right paratracheal  and subcarinal adenopathy identified. Right paratracheal nodal mass measures 2.6 cm, image 118/7. Previously 1.3 cm. Subcarinal lymph node measures 1.8 cm, image 173/7. Unchanged from previous exam. New right hilar adenopathy measures 1.2 cm, image 174/7. Progressive left-sided pre-vascular adenopathy is also identified. Index lymph node measures 1.8 cm, image 143/7. Formally 1.5 cm. Lungs/Pleura: There is complete occlusion of the left upper lobe airway with chronic consolidative change throughout the left upper lobe. Underlying neoplastic process  is suspected. Moderate left effusion is increased from previous exam. There is been progressive atelectasis and volume loss from the left lower lobe. New small right pleural effusion is identified. Extensive pulmonary nodularity throughout the right lung is identified compatible with metastatic disease. This has progressed when compared with the previous exam. Nodules are increased in multiplicity and size from previous exam. Additionally, there is new marked asymmetric interlobular septal thickening throughout the right lung which may reflect lymphangitic spread of tumor versus pulmonary edema or combination of both. Upper Abdomen: Multiple new low-density foci within the liver are concerning for metastatic disease. Index lesion within anterior dome of liver measures 0.9 cm, image 105/5. Central lobe of liver lesion measures 1.2 cm, image 120/5. No acute findings. Musculoskeletal: No chest wall abnormality. No acute or significant osseous findings. Review of the MIP images confirms the above findings. IMPRESSION: 1. No evidence for acute pulmonary embolus. 2. 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 3. Progressive mediastinal and right hilar adenopathy compatible with progressive metastatic disease. 4. Extensive pulmonary nodularity throughout the right lung is identified  compatible with metastatic disease. This has progressed when compared with the previous exam. Nodules are increased in multiplicity and size from previous exam. Additionally, there is new marked asymmetric interlobular septal thickening throughout the right lung which may reflect lymphangitic spread of tumor versus pulmonary edema or combination of both. 5. Moderate left effusion is increased from previous exam. There has been progressive atelectasis and volume loss from the left lower lobe. 6. 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 7. New small right pleural effusion. 8. Multiple new low-density foci within the liver are concerning for metastatic disease. 9. Diffuse decreased AP diameter of the trachea and lower airways concerning for chronic tracheobronchomalacia. 10. Coronary artery calcifications. 11.  Aortic Atherosclerosis (ICD10-I70.0). Electronically Signed   By: Signa Kell M.D.   On: 02/03/2023 13:11   DG Chest Portable 1 View Result Date: 02/03/2023 CLINICAL DATA:  Chest pain and shortness of breath. EXAM: PORTABLE CHEST 1 VIEW COMPARISON:  12/20/2022 FINDINGS: Stable cardiomediastinal contours scratch sec cardiomediastinal contours are obscured by pulmonary opacities. SVC stent is again seen. Persistent complete opacification of the left upper lobe. Progressive diffuse nodular interstitial opacities throughout both lungs. Which is concerning for progressive metastatic disease given the appearance of numerous tiny nodules noted on CT from 12/18/2022. IMPRESSION: 1. Persistent complete opacification of the left upper lobe. 2. Progressive diffuse nodular interstitial opacities throughout both lungs concerning for worsening metastatic disease. Electronically Signed   By: Signa Kell M.D.   On: 02/03/2023 10:25    Recent Labs: Lab Results  Component Value Date   WBC 5.1  02/03/2023   HGB 8.2 (L) 02/03/2023   PLT 186 02/03/2023   NA 130 (L) 02/03/2023   K 3.6 02/03/2023   CL 89 (L) 02/03/2023   CO2 23 02/03/2023   GLUCOSE 97 02/03/2023   BUN 38 (H) 02/03/2023   CREATININE 6.26 (H) 02/03/2023   BILITOT 0.4 01/30/2023   ALKPHOS 164 (H) 01/30/2023   AST 23 01/30/2023   ALT 12 01/30/2023   PROT 7.2 01/30/2023   ALBUMIN 3.7 01/30/2023   CALCIUM 9.5 02/03/2023   GFRAA 13 (L) 10/13/2019    Speciality Comments: No specialty comments available.  Procedures:  No procedures performed Allergies: Levofloxacin and Tape  Assessment / Plan:     Visit Diagnoses: No diagnosis found.  ***  Orders: No orders of the defined types were placed in this encounter.  No orders of the defined types were placed in this encounter.    Follow-Up Instructions: No follow-ups on file.   Metta Clines, RT  Note - This record has been created using [employer].  Chart creation errors have been sought, but may not always  have been located. Such creation errors do not reflect on  the standard of medical care.

## 2023-02-10 NOTE — Telephone Encounter (Signed)
 [name]

## 2023-02-11 MED FILL — Fosaprepitant Dimeglumine For IV Infusion 150 MG (Base Eq): INTRAVENOUS | Qty: 5 | Status: AC

## 2023-02-11 NOTE — Progress Notes (Signed)
 I called pt to see if she had any questions about her appts for tomorrow. Pt asked about medications she was supposed to have. I explained that the steroid and antinausea medications Dr Autumn had ordered for her were entered into her [facility] pharmacy. I encouraged pt to call that location to inquire about their availability. No additional questions.

## 2023-02-12 ENCOUNTER — Inpatient Hospital Stay (HOSPITAL_BASED_OUTPATIENT_CLINIC_OR_DEPARTMENT_OTHER): Payer: Medicare Other | Admitting: Oncology

## 2023-02-12 ENCOUNTER — Other Ambulatory Visit: Payer: Self-pay

## 2023-02-12 ENCOUNTER — Other Ambulatory Visit: Payer: Self-pay | Admitting: Medical Oncology

## 2023-02-12 ENCOUNTER — Inpatient Hospital Stay: Payer: Medicare Other | Attending: Oncology

## 2023-02-12 ENCOUNTER — Inpatient Hospital Stay: Payer: Medicare Other

## 2023-02-12 ENCOUNTER — Encounter: Payer: Self-pay | Admitting: General Practice

## 2023-02-12 ENCOUNTER — Encounter (HOSPITAL_COMMUNITY): Payer: Medicare Other

## 2023-02-12 VITALS — BP 143/87 | HR 84 | Temp 98.4°F | Resp 22

## 2023-02-12 DIAGNOSIS — N186 End stage renal disease: Secondary | ICD-10-CM

## 2023-02-12 DIAGNOSIS — C3490 Malignant neoplasm of unspecified part of unspecified bronchus or lung: Secondary | ICD-10-CM

## 2023-02-12 DIAGNOSIS — D631 Anemia in chronic kidney disease: Secondary | ICD-10-CM

## 2023-02-12 DIAGNOSIS — Z5112 Encounter for antineoplastic immunotherapy: Secondary | ICD-10-CM | POA: Insufficient documentation

## 2023-02-12 DIAGNOSIS — Z79899 Other long term (current) drug therapy: Secondary | ICD-10-CM | POA: Diagnosis not present

## 2023-02-12 DIAGNOSIS — Z452 Encounter for adjustment and management of vascular access device: Secondary | ICD-10-CM

## 2023-02-12 DIAGNOSIS — Z5189 Encounter for other specified aftercare: Secondary | ICD-10-CM | POA: Diagnosis not present

## 2023-02-12 DIAGNOSIS — K869 Disease of pancreas, unspecified: Secondary | ICD-10-CM

## 2023-02-12 DIAGNOSIS — C7931 Secondary malignant neoplasm of brain: Secondary | ICD-10-CM | POA: Diagnosis not present

## 2023-02-12 DIAGNOSIS — C3412 Malignant neoplasm of upper lobe, left bronchus or lung: Secondary | ICD-10-CM

## 2023-02-12 DIAGNOSIS — Z992 Dependence on renal dialysis: Secondary | ICD-10-CM

## 2023-02-12 DIAGNOSIS — Z87891 Personal history of nicotine dependence: Secondary | ICD-10-CM | POA: Diagnosis not present

## 2023-02-12 DIAGNOSIS — Z5111 Encounter for antineoplastic chemotherapy: Secondary | ICD-10-CM | POA: Insufficient documentation

## 2023-02-12 DIAGNOSIS — Z95828 Presence of other vascular implants and grafts: Secondary | ICD-10-CM | POA: Insufficient documentation

## 2023-02-12 LAB — CBC WITH DIFFERENTIAL (CANCER CENTER ONLY)
Abs Immature Granulocytes: 0.06 10*3/uL (ref 0.00–0.07)
Basophils Absolute: 0 10*3/uL (ref 0.0–0.1)
Basophils Relative: 0 %
Eosinophils Absolute: 0.1 10*3/uL (ref 0.0–0.5)
Eosinophils Relative: 1 %
HCT: 21.1 % — ABNORMAL LOW (ref 36.0–46.0)
Hemoglobin: 7 g/dL — ABNORMAL LOW (ref 12.0–15.0)
Immature Granulocytes: 1 %
Lymphocytes Relative: 13 %
Lymphs Abs: 1 10*3/uL (ref 0.7–4.0)
MCH: 28.5 pg (ref 26.0–34.0)
MCHC: 33.2 g/dL (ref 30.0–36.0)
MCV: 85.8 fL (ref 80.0–100.0)
Monocytes Absolute: 0.7 10*3/uL (ref 0.1–1.0)
Monocytes Relative: 9 %
Neutro Abs: 5.8 10*3/uL (ref 1.7–7.7)
Neutrophils Relative %: 76 %
Platelet Count: 182 10*3/uL (ref 150–400)
RBC: 2.46 MIL/uL — ABNORMAL LOW (ref 3.87–5.11)
RDW: 18.7 % — ABNORMAL HIGH (ref 11.5–15.5)
WBC Count: 7.7 10*3/uL (ref 4.0–10.5)
nRBC: 0 % (ref 0.0–0.2)

## 2023-02-12 LAB — CMP (CANCER CENTER ONLY)
ALT: 7 U/L (ref 0–44)
AST: 15 U/L (ref 15–41)
Albumin: 4 g/dL (ref 3.5–5.0)
Alkaline Phosphatase: 177 U/L — ABNORMAL HIGH (ref 38–126)
Anion gap: 13 (ref 5–15)
BUN: 28 mg/dL — ABNORMAL HIGH (ref 6–20)
CO2: 27 mmol/L (ref 22–32)
Calcium: 9.5 mg/dL (ref 8.9–10.3)
Chloride: 90 mmol/L — ABNORMAL LOW (ref 98–111)
Creatinine: 5.39 mg/dL — ABNORMAL HIGH (ref 0.44–1.00)
GFR, Estimated: 9 mL/min — ABNORMAL LOW (ref >60)
Glucose, Bld: 109 mg/dL — ABNORMAL HIGH (ref 70–99)
Potassium: 3.4 mmol/L — ABNORMAL LOW (ref 3.5–5.1)
Sodium: 130 mmol/L — ABNORMAL LOW (ref 135–145)
Total Bilirubin: 0.7 mg/dL (ref 0.0–1.2)
Total Protein: 6.6 g/dL (ref 6.5–8.1)

## 2023-02-12 MED ORDER — SODIUM CHLORIDE 0.9 % IV SOLN
200.0000 mg | Freq: Once | INTRAVENOUS | Status: AC
  Administered 2023-02-12: 200 mg via INTRAVENOUS
  Filled 2023-02-12: qty 200

## 2023-02-12 MED ORDER — SODIUM CHLORIDE 0.9% FLUSH
3.0000 mL | INTRAVENOUS | Status: AC | PRN
  Administered 2023-02-12: 3 mL

## 2023-02-12 MED ORDER — HEPARIN SOD (PORK) LOCK FLUSH 100 UNIT/ML IV SOLN
250.0000 [IU] | Freq: Once | INTRAVENOUS | Status: DC | PRN
Start: 2023-02-12 — End: 2023-02-12

## 2023-02-12 MED ORDER — DIPHENHYDRAMINE HCL 25 MG PO CAPS
25.0000 mg | ORAL_CAPSULE | Freq: Once | ORAL | Status: AC
  Administered 2023-02-12: 25 mg via ORAL
  Filled 2023-02-12: qty 1

## 2023-02-12 MED ORDER — SODIUM CHLORIDE 0.9 % IV SOLN: INTRAVENOUS | Status: DC

## 2023-02-12 MED ORDER — ACETAMINOPHEN 325 MG PO TABS
650.0000 mg | ORAL_TABLET | Freq: Once | ORAL | Status: AC
  Administered 2023-02-12: 650 mg via ORAL
  Filled 2023-02-12: qty 2

## 2023-02-12 MED ORDER — SODIUM CHLORIDE 0.9% FLUSH: 3.0000 mL | INTRAVENOUS | Status: DC | PRN

## 2023-02-12 MED ORDER — HEPARIN SOD (PORK) LOCK FLUSH 100 UNIT/ML IV SOLN
500.0000 [IU] | Freq: Once | INTRAVENOUS | Status: AC
  Administered 2023-02-12: 500 [IU]

## 2023-02-12 MED ORDER — ONDANSETRON 4 MG PO TBDP
8.0000 mg | ORAL_TABLET | ORAL | Status: AC
  Administered 2023-02-12: 8 mg via ORAL
  Filled 2023-02-12: qty 2

## 2023-02-12 MED ORDER — SODIUM CHLORIDE 0.9% IV SOLUTION
250.0000 mL | INTRAVENOUS | Status: DC
  Administered 2023-02-12: 100 mL via INTRAVENOUS

## 2023-02-12 MED ORDER — SODIUM CHLORIDE 0.9% FLUSH
10.0000 mL | Freq: Once | INTRAVENOUS | Status: AC
  Administered 2023-02-12: 10 mL

## 2023-02-12 MED ORDER — HEPARIN SOD (PORK) LOCK FLUSH 100 UNIT/ML IV SOLN: 250.0000 [IU] | INTRAVENOUS | Status: DC | PRN

## 2023-02-12 NOTE — Progress Notes (Signed)
 [facility]  ONCOLOGY CLINIC PROGRESS NOTE   Patient Care Team: Oley Bascom RAMAN, NP as PCP - General (Pulmonary Disease) O'Neal, Darryle Ned, MD as PCP - Cardiology (Cardiology) Bensimhon, Toribio SAUNDERS, MD as PCP - Advanced Heart Failure (Cardiology) Prentis Duwaine BROCKS, RN as Oncology Nurse Navigator  PATIENT NAME: Tina Watkins   MR#: [id number] DOB: 05/03/1965  Date of visit: 02/12/2023   ASSESSMENT & PLAN:   Tina Watkins is a 58 y.o. lady with multiple medical comorbidities including kidney transplant, currently ESRD on HD, congestive heart failure, atrial fibrillation/flutter, COPD, depression, chronic gout, chronic pain, anemia of chronic disease, gastroparesis, midodrine  dependent hypotension, history of strokes, suspected mesenteric ischemia, was recently diagnosed with malignant lung nodule on 11/16/2022 via bronchoscopy.  Immunostains suggestive of adenocarcinoma.   Lung cancer, upper lobe (HCC) -Please review oncology history for additional details and timeline of events.  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  -Her case was discussed in tumor conference on 01/07/2023.  Consensus opinion is that these lung nodules are malignancy related.  Hence clinically it appears to be stage IVa disease.  All treatment options are palliative in nature and not curative in intent and this was explained to patient and her fianc and they verbalized understanding.  -Previously we submitted request for [employer] One mutation testing on the specimen.  It showed no actionable mutations.  -Plan for chemotherapy with immunotherapy or immunotherapy alone, pending the results of the mutation testing.  Since Alimta could not be used because she is on hemodialysis, will plan for carboplatin , paclitaxel , pembrolizumab  for 4 cycles followed by  maintenance treatments with pembrolizumab  alone every 3 weeks.  We will dose reduce carboplatin  to AUC 4 and paclitaxel  to 150 mg/m, given dialysis status.  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  She could not even receive PICC line placement and ended up getting midline placed in the left arm.  IR suggested that we should check with nephrology to see if we can use her dialysis catheter for chemotherapy.  Presented to clinic on 02/12/2023 for initiation of treatments.  Her hemoglobin was 7 and she had to be given 1 unit of PRBC transfusion.  We proceeded with pembrolizumab  that day.  Plan to add carboplatin  and paclitaxel  at a later date, pending resolution of IV access issues.  -Will follow-up on workup on pancreatic lesion to see if it is a primary pancreatic malignancy or metastatic from lung primary.  MRI of the brain from 01/29/2023 was resulted today.  Showed 1.2 cm enhancing mass in the left insula, consistent with metastatic disease.  Started her on Decadron  4 mg p.o. twice daily and referral sent to radiation oncology for further evaluation and management.  Lesion of pancreas Heterogeneous pancreatic cystic lesion was not FDG avid on recent PET scan.  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  This will need additional evaluation.    -Case reviewed by Dr. Wilhelmenia who recommended workup at a tertiary GI center.  She had biopsy of this on 02/10/2023 at [facility].  Will follow-up on the results to see if she has primary pancreatic neoplasm versus metastatic disease from lung primary.  Further treatment options will depend on biopsy results.  -Her CA 19-9 was undetectable but CEA was elevated at 230 on 12/30/2022.  This will be monitored on future visits.  Anemia in chronic kidney disease Possibly related to kidney disease. Her mircera is on hold due to the malignancy.   -Order iron  and other  relevant tests to determine if replacement is needed.  -She was given 1 unit of PRBCs today.  Since she is on chemotherapy, goal is to maintain hemoglobin closer to 8.    I reviewed lab results and outside records for this visit and discussed relevant results with the patient. Diagnosis, plan of care and treatment options were also discussed in detail with the patient. Opportunity provided to ask questions and answers provided to her apparent satisfaction. Provided instructions to call our clinic with any problems, questions or concerns prior to return visit. I recommended to continue follow-up with PCP and sub-specialists. She verbalized understanding and agreed with the plan.   NCCN guidelines have been consulted in the planning of this patient's care.  I spent a total of 40 minutes during this encounter with the patient including review of chart and various tests results, discussions about plan of care and coordination of care plan.   Tasfia Vasseur, MD   [facility] - A DEPT OF [facility] [address] [place] [address] Dept: [phone] Dept Fax: [phone]    CHIEF COMPLAINT/ REASON FOR VISIT:   Lung adenocarcinoma with multiple bilateral lung nodules, 1 intracranial metastasis.  Stage IV disease.   Current Treatment: Plan for palliative systemic chemoimmunotherapy with carboplatin , paclitaxel  and pembrolizumab .  Pemetrexed could not be used because of hemodialysis status.  INTERVAL HISTORY:    Discussed the use of AI scribe software for clinical note transcription with the patient, who gave verbal consent to proceed.   Tina Watkins is here today for repeat clinical assessment.   I have reviewed the past medical history, past surgical history, social history and family history with the patient and they are unchanged from previous note.  HISTORY OF PRESENT ILLNESS:   ONCOLOGY HISTORY:     Patient was seen by pulmonology after she was diagnosed with COVID-19 infection in the ED on 09/14/2022.  Because of her chronic smoking history with at least 51-pack-year smoking, a low-dose CT of the chest was obtained for lung cancer screening.  On 09/28/2022, CT chest showed dominant new solid peripheral left upper lobe lung nodule measuring 21 mm.  Several new clustered solid pulmonary nodules throughout left upper lobe, largest measuring 5 mm.  Numerous additional tiny 2 to 3 mm solid pulmonary nodules scattered in both lungs, new finding.  Findings concerning for primary bronchogenic malignancy.  Lung RADS 4B, suspicious.  Also noted was cystic pancreatic tail lesions, largest measuring 2.6 cm posteriorly, indeterminate.  MRI abdomen recommended for further evaluation.   Because of CT chest findings, PET/CT was obtained on 10/19/2022 and it showed 2.6 cm subpleural nodule in the left upper lobe with SUV max of 4.9.  Primary bronchogenic neoplasm could not be excluded.  Tissue sampling was recommended.  Also noted was 15 mm short axis prevascular lymph node with SUV of 2.4, new compared to 2023.  Indeterminate.  Small left pleural effusion was also noted.   Several subcentimeter lung nodules were noted on CT scan . They are below the threshold by size, for determining FDG activity and hence were not apparent on PET scan.    MRI of the abdomen on 11/11/2022: Evaluation of the pancreatic tail was limited by metallic susceptibility  artifact from adjacent surgical clips as well as iron  burden in the adjacent stomach parenchyma.  There was a complex, multiseptated, internally heterogeneous cystic lesion arising from the tip of the pancreatic tail measuring 4.4 x 2.3 x 3.2 cm.  Contrast enhancement could not be confirmed due to technical limitations.  Appearance was concerning for a partially cystic malignancy with a significant solid component.  Tissue sampling was recommended.  For future, recommended  multiphasic contrast-enhanced CT.  Severe hepatic and splenic iron  deposition was noted.  Coarse, nodular contour of the liver, consistent with cirrhosis.  Status post bilateral native nephrectomy.  Right lower quadrant renal transplant graft was noted.  Diffusely heterogeneous bone marrow, reflecting marrow reconversion renal osteodystrophy.   On 11/11/2022, she had multidetector CT imaging of the chest using thin slice collimation for electromagnetic bronchoscopy planning purposes.  It showed similar findings compared to prior imaging.   On 11/16/2022, Dr. Shelah performed robotic assisted navigational bronchoscopy and biopsies.  Left upper lobe lung nodule needle aspiration biopsy showed presence of malignant cells.  Immunostains were obtained at a later date and showed positivity for TTF-1 and negative for p40 and cytokeratin 5/6, consistent with lung adenocarcinoma.  Endobronchial brushings and brushings from left upper lobe lung nodule showed no malignant cells.   She was admitted to the hospital on 12/18/2022 after she presented with shortness of breath, cough with yellow sputum, chills, myalgias and diarrhea.  She was noted to be in acute hypoxic respiratory failure, related to postobstructive pneumonia.  CT angiogram of the chest on 12/18/2022 showed no evidence of pulmonary embolism.  It did show numerous small nodules throughout right lung which appeared to be increased in number compared to prior study.  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  Stable mediastinal lymphadenopathy.   Underwent left-sided thoracentesis on 12/20/2022.  Pleural fluid cytology showed no evidence of malignant cells.   NGS testing showed no actionable mutations.  Plan made for palliative chemoimmunotherapy with carboplatin , paclitaxel  and pembrolizumab .    Port-A-Cath could not be placed  because of bad vasculopathy.  She could not even receive PICC line placement and ended up getting midline placed in the left arm.  Presented to clinic on 02/12/2023 for initiation of treatments.  Her hemoglobin was 7 and she had to be given 1 unit of PRBC transfusion.  We proceeded with pembrolizumab  that day.  Plan to add carboplatin  and paclitaxel  at a later date, pending resolution of IV access issues.  Oncology History  Lung cancer, upper lobe (HCC)  10/28/2022 Initial Diagnosis   Lung cancer, upper lobe (HCC)   01/11/2023 Cancer Staging   Staging form: Lung, AJCC 8th Edition - Clinical: Stage IVA (cTX, cNX, cM1a) - Signed by Autumn Millman, MD on 01/11/2023   02/12/2023 -  Chemotherapy   Patient is on Treatment Plan : LUNG NSCLC Carboplatin  (6) + Paclitaxel  (200) + Pembrolizumab  (200) D1 q21d x 4 cycles / Pembrolizumab  (200) Maintenance D1 q21d         REVIEW OF SYSTEMS:   Review of Systems - Oncology  All other pertinent systems were reviewed with the patient and are negative.  ALLERGIES: She is allergic to levofloxacin  and tape.  MEDICATIONS:  Current Outpatient Medications  Medication Sig Dispense Refill   allopurinol  (ZYLOPRIM ) 100 MG tablet Take 0.5 tablets (50 mg total) by mouth 2 (two) times a week. (Patient taking differently: Take 100  mg by mouth daily in the afternoon.) 90 tablet 3   amiodarone  (PACERONE ) 200 MG tablet Take 0.5 tablets (100 mg total) by mouth daily. 90 tablet 1   apixaban  (ELIQUIS ) 2.5 MG TABS tablet Take 1 tablet (2.5 mg total) by mouth 2 (two) times daily. Okay to restart this medication on 11/18/2022     Colchicine  0.6 MG CAPS Take 0.6 mg by mouth daily as needed (gout flare up). 30 capsule 2   cyclobenzaprine  (FLEXERIL ) 5 MG tablet Take 1 tablet (5 mg total) by mouth at bedtime as needed. 90 tablet 1   dexamethasone  (DECADRON ) 4 MG tablet Take 2 tablets (8mg ) by mouth daily starting the day after carboplatin  for 3 days. Take with food 30 tablet 1    dexamethasone  (DECADRON ) 4 MG tablet Take 1 tablet (4 mg total) by mouth 2 (two) times daily with a meal. 60 tablet 0   diclofenac  Sodium (VOLTAREN ) 1 % GEL Apply 4 g topically as needed. 50 g 0   diphenhydrAMINE  (BENADRYL ) 25 mg capsule Take 1 capsule (25 mg total) by mouth every 8 (eight) hours as needed. 30 capsule 6   dorzolamide -timolol  (COSOPT ) 2-0.5 % ophthalmic solution Place 1 drop into the left eye 2 (two) times daily.     famotidine  (PEPCID ) 20 MG tablet TAKE 1 TABLET BY MOUTH TWICE A DAY 180 tablet 3   fluticasone -salmeterol (ADVAIR  HFA) 115-21 MCG/ACT inhaler Inhale 2 puffs into the lungs 2 (two) times daily. 1 each 12   gabapentin  (NEURONTIN ) 100 MG capsule Take 100 mg by mouth 3 (three) times daily as needed (nerve pain).     heparin  sodium, porcine, 1000 UNIT/ML injection Inject 1,000 Units into the peritoneum once.     iron  sucrose (VENOFER) 20 MG/ML injection Inject 100 mg into the vein one time in dialysis.     lidocaine  (LIDODERM ) 5 % Place 1 patch onto the skin daily as needed (for pain- remove old patch first).      lidocaine -prilocaine  (EMLA ) cream Apply to affected area once 30 g 3   metoCLOPramide  (REGLAN ) 10 MG tablet Take 1 tablet (10 mg total) by mouth 3 (three) times daily before meals. (Patient taking differently: Take 10 mg by mouth daily in the afternoon.) 90 tablet 2   midodrine  (PROAMATINE ) 10 MG tablet TAKE 1.5 TABLETS (15 MG TOTAL) BY MOUTH 3 (THREE) TIMES DAILY. NEEDS FOLLOW UP APPOINTMENT FOR ANYMORE REFILLS 405 tablet 0   omeprazole  (PRILOSEC) 40 MG capsule TAKE 1 CAPSULE (40 MG TOTAL) BY MOUTH DAILY. 90 capsule 1   ondansetron  (ZOFRAN ) 8 MG tablet Take 1 tablet (8 mg total) by mouth every 8 (eight) hours as needed for nausea or vomiting. Start on the third day after carboplatin . 30 tablet 1   oxyCODONE -acetaminophen  (PERCOCET) 10-325 MG tablet Take 1 tablet by mouth 4 (four) times daily as needed for pain.     prochlorperazine  (COMPAZINE ) 10 MG tablet Take 1  tablet (10 mg total) by mouth every 6 (six) hours as needed for nausea or vomiting. 30 tablet 1   sertraline  (ZOLOFT ) 25 MG tablet Take 1 tablet (25 mg total) by mouth daily. 30 tablet 3   VELPHORO  500 MG chewable tablet Taking 500 mg by mouth per meal or snack     zolpidem  (AMBIEN ) 10 MG tablet Take 1 tablet (10 mg total) by mouth at bedtime as needed for sleep. 10 tablet 0   No current facility-administered medications for this visit.     VITALS:   There were  no vitals taken for this visit.  Wt Readings from Last 3 Encounters:  02/03/23 100 lb (45.4 kg)  01/30/23 102 lb 11.8 oz (46.6 kg)  01/13/23 107 lb 14.4 oz (48.9 kg)    There is no height or weight on file to calculate BMI.  Performance status (ECOG): 2 - Symptomatic, <50% confined to bed  PHYSICAL EXAM:   Physical Exam Constitutional:      General: She is not in acute distress. HENT:     Head: Normocephalic and atraumatic.  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     Heart sounds: Normal heart sounds.  Pulmonary:     Effort: Pulmonary effort is normal.     Breath sounds: Normal breath sounds.  Abdominal:     General: There is no distension.  Musculoskeletal:     Right lower leg: No edema.     Left lower leg: No edema.     Comments: Left arm midline in place  Neurological:     Mental Status: She is alert and oriented to person, place, and time.  Psychiatric:        Mood and Affect: Mood normal.        Behavior: Behavior normal.        Thought Content: Thought content normal.       LABORATORY DATA:   I have reviewed the data as listed.  Results for orders placed or performed in visit on 02/12/23  Type and screen  Result Value Ref Range   ABO/RH(D) B POS    Antibody Screen NEG    Sample Expiration 02/15/2023,2359    Unit Number [id number]    Blood Component Type RED CELLS,LR    Unit division 00    Status of Unit  ISSUED,FINAL    Transfusion Status OK TO TRANSFUSE    Crossmatch Result      Compatible Performed at [facility], [address], [place], [place] [address]   BPAM RBC  Result Value Ref Range   ISSUE DATE / TIME [id number]    Blood Product Unit Number [id number]    PRODUCT CODE E0382V00    Unit Type and Rh 7300    Blood Product Expiration Date 797497847640   Results for orders placed or performed in visit on 02/12/23  CBC with Differential (Cancer Center Only)  Result Value Ref Range   WBC Count 7.7 4.0 - 10.5 K/uL   RBC 2.46 (L) 3.87 - 5.11 MIL/uL   Hemoglobin 7.0 (L) 12.0 - 15.0 g/dL   HCT 78.8 (L) 63.9 - 53.9 %   MCV 85.8 80.0 - 100.0 fL   MCH 28.5 26.0 - 34.0 pg   MCHC 33.2 30.0 - 36.0 g/dL   RDW 81.2 (H) 88.4 - 84.4 %   Platelet Count 182 150 - 400 K/uL   nRBC 0.0 0.0 - 0.2 %   Neutrophils Relative % 76 %   Neutro Abs 5.8 1.7 - 7.7 K/uL   Lymphocytes Relative 13 %   Lymphs Abs 1.0 0.7 - 4.0 K/uL   Monocytes Relative 9 %   Monocytes Absolute 0.7 0.1 - 1.0 K/uL   Eosinophils Relative 1 %   Eosinophils Absolute 0.1 0.0 - 0.5 K/uL   Basophils Relative 0 %   Basophils Absolute 0.0 0.0 - 0.1 K/uL   Immature Granulocytes 1 %   Abs Immature Granulocytes 0.06 0.00 - 0.07  K/uL  CMP (Cancer Center only)  Result Value Ref Range   Sodium 130 (L) 135 - 145 mmol/L   Potassium 3.4 (L) 3.5 - 5.1 mmol/L   Chloride 90 (L) 98 - 111 mmol/L   CO2 27 22 - 32 mmol/L   Glucose, Bld 109 (H) 70 - 99 mg/dL   BUN 28 (H) 6 - 20 mg/dL   Creatinine 4.60 (H) 9.55 - 1.00 mg/dL   Calcium  9.5 8.9 - 10.3 mg/dL   Total Protein 6.6 6.5 - 8.1 g/dL   Albumin  4.0 3.5 - 5.0 g/dL   AST 15 15 - 41 U/L   ALT 7 0 - 44 U/L   Alkaline Phosphatase 177 (H) 38 - 126 U/L   Total Bilirubin 0.7 0.0 - 1.2 mg/dL   GFR, Estimated 9 (L) >60 mL/min   Anion gap 13 5 - 15  T4  Result Value Ref Range   T4, Total 8.6 4.5 - 12.0 ug/dL      RADIOGRAPHIC STUDIES:  I have personally  reviewed the radiological images as listed and agree with the findings in the report.  MR Brain W Wo Contrast Result Date: 02/12/2023 CLINICAL DATA:  Non-small cell lung cancer, staging EXAM: MRI HEAD WITHOUT AND WITH CONTRAST TECHNIQUE: Multiplanar, multiecho pulse sequences of the brain and surrounding structures were obtained without and with intravenous contrast. CONTRAST:  5mL GADAVIST  GADOBUTROL  1 MMOL/ML IV SOLN COMPARISON:  01/18/2003 MRI head FINDINGS: Brain: Enhancing mass in the left insula, which measures up to 1.2 x 0.9 x 1.1 cm (AP x TR x CC) (series 19, image 74 and series 17, image 19). No other abnormal parenchymal or meningeal enhancement. No evidence of acute or subacute infarct. No acute hemorrhage, mass effect, or midline shift. No hydrocephalus or extra-axial collection. Pituitary and craniocervical junction within normal limits. No hemosiderin deposition to suggest remote hemorrhage. Mildly advanced cerebral volume loss for age. 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 Remote lacunar infarct in the left basal ganglia. T2 hyperintense signal in the periventricular white matter, likely the sequela of mild-to-moderate chronic small vessel ischemic disease. Vascular: Normal arterial flow voids. Normal arterial and venous enhancement. Skull and upper cervical spine: Normal marrow signal. Sinuses/Orbits: Clear paranasal sinuses. No acute finding in the orbits. Other: Trace fluid in the left mastoid air cells. IMPRESSION: Enhancing mass in the left insula, measuring up to 1.2 cm, consistent with metastatic disease. No other evidence of intracranial metastatic disease. These results will be called to the ordering clinician or representative by the Radiologist Assistant, and communication documented in the PACS or [employer]. Electronically Signed   By: Donald Campion M.D.   On: 02/12/2023 13:15   IR PICC PLACEMENT LEFT >5 YRS INC IMG GUIDE Result Date:  02/08/2023 INDICATION: PICC PLACEMENT; New lung cancer dx. Needs chemo Briefly, 58 year old female with a history of ESRD on HD via a RIGHT femoral catheter. No central stenoses with SVC stent and significant RIGHT chest wall collaterals. Patient previously recommended for port catheter, however no viable central venous access. Midline catheter recommended after provider discussion. EXAM: Procedures: 1. LEFT UPPER EXTREMITY VENOGRAM 2. ULTRASOUND AND FLUOROSCOPIC GUIDED PICC LINE INSERTION MEDICATIONS: None CONTRAST:  10 mL Omnipaque  300 intravenous contrast ANESTHESIA/SEDATION: Local anesthetic was administered. The patient was continuously monitored during the procedure by the interventional radiology nurse under my direct supervision. FLUOROSCOPY TIME:  Fluoroscopic dose; 3 mGy COMPLICATIONS: None immediate. TECHNIQUE: The procedure, risks, benefits, and alternatives were explained to the  patient and/or patient's representative and informed written consent was obtained. A timeout was performed prior to the initiation of the procedure. The LEFT upper extremity was prepped with chlorhexidine  in a sterile fashion, and a sterile drape was applied covering the operative field. Maximum barrier sterile technique with sterile gowns and gloves were used for the procedure. A timeout was performed prior to the initiation of the procedure. Local anesthesia was provided with 1% lidocaine . Under direct ultrasound guidance, the LEFT basilic vein was accessed with a micropuncture kit after the overlying soft tissues were anesthetized with 1% lidocaine . An ultrasound image was saved for documentation purposes. A LEFT upper extremity venogram was then performed, demonstrating patent basilic vein to the level of the thoracic inlet. A guidewire was advanced to the level of the superior caval-atrial junction for measurement purposes and the central catheter line was cut to length. A peel-away sheath was placed and a 21 cm, 5 Fr,  single lumen was inserted to level of the axillary vein. A post procedure spot fluoroscopic was obtained. The catheter easily aspirated and flushed and was sutured in place. A dressing was placed. The patient tolerated the procedure well without immediate post procedural complication. FINDINGS: *LEFT upper extremity venogram with central venous obstruction at the level of the thoracic inlet. Occluded LEFT innominate vein, with collateral venous drainage into the RIGHT atrium via the hemi-azygous veins. *After catheter placement, the midline catheter tip lies within the axillary vein the catheter aspirates and flushes normally and is ready for immediate use. IMPRESSION: 1. 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 2. Successful ultrasound and fluoroscopic guided placement of a LEFT basilic vein-approach, 21 cm, 5 Fr single lumen midline catheter with tip at the axillary vein. The central line is ready for immediate use. Thom Hall, MD [facility] Electronically Signed   By: Thom Hall M.D.   On: 02/08/2023 17:59   CT Angio Chest PE W and/or Wo Contrast Result Date: 02/03/2023 CLINICAL DATA:  Shortness of breath. History of lung cancer. * Tracking Code: BO * EXAM: CT ANGIOGRAPHY CHEST WITH CONTRAST TECHNIQUE: Multidetector CT imaging of the chest was performed using the standard protocol during bolus administration of intravenous contrast. Multiplanar CT image reconstructions and MIPs were obtained to evaluate the vascular anatomy. RADIATION DOSE REDUCTION: This exam was performed according to the departmental dose-optimization program which includes automated exposure control, adjustment of the mA and/or kV according to patient size and/or use of iterative reconstruction technique. CONTRAST:  75mL OMNIPAQUE  IOHEXOL  350 MG/ML SOLN COMPARISON:  12/18/2022 FINDINGS: Cardiovascular: Satisfactory opacification of the pulmonary  arteries to the segmental level. No evidence of pulmonary embolism. Web-like filling defect within the distal right main pulmonary artery is unchanged from previous exam and likely represents sequelae of chronic PE. Heart size appears normal. The main pulmonary artery measures 3.8 cm in diameter. Aortic atherosclerosis and coronary artery calcifications. Mediastinum/Nodes: Diffuse decreased AP diameter of the trachea and lower airways concerning for chronic tracheobronchomalacia. 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 There is a stent noted within the distal SVC. Retrograde opacification of the SVC via a dilated azygous vein is identified, image 146/7. Right paratracheal and subcarinal adenopathy identified. Right paratracheal nodal mass measures 2.6 cm, image 118/7. Previously 1.3 cm. Subcarinal lymph node measures 1.8 cm, image 173/7. Unchanged from previous exam. New right hilar adenopathy measures 1.2 cm, image 174/7. Progressive left-sided pre-vascular  adenopathy is also identified. Index lymph node measures 1.8 cm, image 143/7. Formally 1.5 cm. Lungs/Pleura: There is complete occlusion of the left upper lobe airway with chronic consolidative change throughout the left upper lobe. Underlying neoplastic process is suspected. Moderate left effusion is increased from previous exam. There is been progressive atelectasis and volume loss from the left lower lobe. New small right pleural effusion is identified. Extensive pulmonary nodularity throughout the right lung is identified compatible with metastatic disease. This has progressed when compared with the previous exam. Nodules are increased in multiplicity and size from previous exam. Additionally, there is new marked asymmetric interlobular septal thickening throughout the right lung which may reflect lymphangitic spread of tumor versus pulmonary edema or combination of  both. Upper Abdomen: Multiple new low-density foci within the liver are concerning for metastatic disease. Index lesion within anterior dome of liver measures 0.9 cm, image 105/5. Central lobe of liver lesion measures 1.2 cm, image 120/5. No acute findings. Musculoskeletal: No chest wall abnormality. No acute or significant osseous findings. Review of the MIP images confirms the above findings. IMPRESSION: 1. No evidence for acute pulmonary embolus. 2. 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 3. Progressive mediastinal and right hilar adenopathy compatible with progressive metastatic disease. 4. Extensive pulmonary nodularity throughout the right lung is identified compatible with metastatic disease. This has progressed when compared with the previous exam. Nodules are increased in multiplicity and size from previous exam. Additionally, there is new marked asymmetric interlobular septal thickening throughout the right lung which may reflect lymphangitic spread of tumor versus pulmonary edema or combination of both. 5. Moderate left effusion is increased from previous exam. There has been progressive atelectasis and volume loss from the left lower lobe. 6. 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 7. New small right pleural effusion. 8. Multiple new low-density foci within the liver are concerning for metastatic disease. 9. Diffuse decreased AP diameter of the trachea and lower airways concerning for chronic tracheobronchomalacia. 10. Coronary artery calcifications. 11.  Aortic Atherosclerosis (ICD10-I70.0). Electronically Signed   By: Waddell Calk M.D.   On: 02/03/2023 13:11   DG Chest Portable 1 View Result Date: 02/03/2023 CLINICAL DATA:  Chest pain and shortness of breath. EXAM: PORTABLE CHEST 1  VIEW COMPARISON:  12/20/2022 FINDINGS: Stable cardiomediastinal contours scratch sec cardiomediastinal contours are obscured by pulmonary opacities. SVC stent is again seen. Persistent complete opacification of the left upper lobe. Progressive diffuse nodular interstitial opacities throughout both lungs. Which is concerning for progressive metastatic disease given the appearance of numerous tiny nodules noted on CT from 12/18/2022. IMPRESSION: 1. Persistent complete opacification of the left upper lobe. 2. Progressive diffuse nodular interstitial opacities throughout both lungs concerning for worsening metastatic disease. Electronically Signed   By: Waddell Calk M.D.   On: 02/03/2023 10:25    CODE STATUS:  Code Status History     Date Active Date Inactive Code Status Order ID Comments User Context   12/18/2022 1552 12/24/2022 2039 Full Code [id number]  Tobie Gaines, DO ED   09/06/2021 1325 09/07/2021 2107 Full Code [id number]  Susen Pastor, MD ED   03/28/2020 1621 03/29/2020 2052 Full Code [id number]  Laurita Cort DASEN, MD ED   09/01/2019 1812 09/03/2019 2011 Full Code [id number]  Cindy Garnette POUR, MD ED   08/05/2019 1443 08/25/2019 0311 Full  Code [id number]  Laurita Cort DASEN, MD ED   07/07/2019 1616 07/17/2019 2317 Full Code [id number]  Laurita Cort DASEN, MD ED   05/09/2019 0706 05/18/2019 2040 Full Code [id number]  Alfornia Madison, MD ED   03/20/2019 1507 03/24/2019 2243 Full Code [id number]  Laurita Cort DASEN, MD Inpatient   11/20/2017 0236 11/20/2017 1916 Full Code [id number]  Hilma Rankins, MD ED   08/09/2017 2312 08/17/2017 1950 Full Code [id number]  Francesco Alan BROCKS, MD Inpatient   09/29/2015 2318 10/03/2015 1841 Full Code [id number]  Celinda Alm Lot, MD Inpatient   09/29/2015 2318 09/29/2015 2318 Full Code [id number]  Celinda Alm Lot, MD Inpatient   01/08/2015 1637 01/10/2015 1918 Full Code [id number]  Leeann Hover, MD Inpatient   06/13/2014 1008 06/16/2014 1800 Full Code [id number]  Evonnie Alm, MD Inpatient   04/10/2012 0109  04/11/2012 2008 Full Code [id number]  Alm Romance, MD Inpatient   12/10/2011 2204 12/12/2011 1423 Full Code [id number]  Ainsley Murray KIDD, RN Inpatient   05/22/2011 1520 05/30/2011 1553 Full Code [id number]  Minor, Elsie RAMAN, NP ED    Questions for Most Recent Historical Code Status (Order [id number])     Question Answer   By: Consent: discussion documented in EHR            No orders of the defined types were placed in this encounter.    Future Appointments  Date Time Provider Department Center  02/17/2023  7:45 AM [facility] FLUSH [facility] None  02/17/2023  9:00 AM [facility] INFUSION [facility] None  02/17/2023 11:40 AM Diyari Cherne, MD [facility] None  02/24/2023  8:00 AM Jeannetta Lonni ORN, MD [facility] None  02/24/2023 12:30 PM [facility] NURSE [facility] None  02/24/2023  1:00 PM Dewey Rush, MD [facility] None  03/03/2023  8:30 AM [facility] PA/NP [facility] None  03/04/2023 10:30 AM [facility] FLUSH [facility] None  03/04/2023 11:00 AM Latondra Gebhart, MD [facility] None  03/08/2023  1:15 PM [facility] FLUSH [facility] None  03/31/2023 10:00 AM Shelah Lamar RAMAN, MD [facility] None  04/07/2023 10:40 AM Oley Bascom RAMAN, NP [facility] None  07/01/2023  1:50 PM [facility]-ANNUAL WELLNESS VISIT [facility] None  07/21/2023  9:30 AM Terra Fairy PARAS, PA-C [facility] None      This document was completed utilizing speech recognition software. Grammatical errors, random word insertions, pronoun errors, and incomplete sentences are an occasional consequence of this system due to software limitations, ambient noise, and hardware issues. Any formal questions or concerns about the content, text or information contained within the body of this dictation should be directly addressed to the provider for clarification.

## 2023-02-12 NOTE — Progress Notes (Signed)
 Ok to treat with Keytruda today per Dr. Arlana Pouch with creatinine of 5.39

## 2023-02-12 NOTE — Progress Notes (Unsigned)
 Blood orders placed and released

## 2023-02-12 NOTE — Patient Instructions (Addendum)
 [facility] - A DEPT OF [facility]  Discharge Instructions: Thank you for choosing [facility] to provide your oncology and hematology care.   If you have a lab appointment with the Cancer Center, please go directly to the Cancer Center and check in at the registration area.   Wear comfortable clothing and clothing appropriate for easy access to any Portacath or PICC line.   We strive to give you quality time with your provider. You may need to reschedule your appointment if you arrive late (15 or more minutes).  Arriving late affects you and other patients whose appointments are after yours.  Also, if you miss three or more appointments without notifying the office, you may be dismissed from the clinic at the provider's discretion.      For prescription refill requests, have your pharmacy contact our office and allow 72 hours for refills to be completed.    Today you received the following chemotherapy and/or immunotherapy agents  Pembrolizumab  (Keytruda )   To help prevent nausea and vomiting after your treatment, we encourage you to take your nausea medication as directed.  BELOW ARE SYMPTOMS THAT SHOULD BE REPORTED IMMEDIATELY: *FEVER GREATER THAN 100.4 F (38 C) OR HIGHER *CHILLS OR SWEATING *NAUSEA AND VOMITING THAT IS NOT CONTROLLED WITH YOUR NAUSEA MEDICATION *UNUSUAL SHORTNESS OF BREATH *UNUSUAL BRUISING OR BLEEDING *URINARY PROBLEMS (pain or burning when urinating, or frequent urination) *BOWEL PROBLEMS (unusual diarrhea, constipation, pain near the anus) TENDERNESS IN MOUTH AND THROAT WITH OR WITHOUT PRESENCE OF ULCERS (sore throat, sores in mouth, or a toothache) UNUSUAL RASH, SWELLING OR PAIN  UNUSUAL VAGINAL DISCHARGE OR ITCHING   Items with * indicate a potential emergency and should be followed up as soon as possible or go to the Emergency Department if any problems should occur.  Please show the CHEMOTHERAPY ALERT CARD or  IMMUNOTHERAPY ALERT CARD at check-in to the Emergency Department and triage nurse.  Should you have questions after your visit or need to cancel or reschedule your appointment, please contact [facility] - A DEPT OF [facility]  Dept: [phone]  and follow the prompts.  Office hours are 8:00 a.m. to 4:30 p.m. Monday - Friday. Please note that voicemails left after 4:00 p.m. may not be returned until the following business day.  We are closed weekends and major holidays. You have access to a nurse at all times for urgent questions. Please call the main number to the clinic Dept: [phone] and follow the prompts.   For any non-urgent questions, you may also contact your provider using MyChart. We now offer e-Visits for anyone 40 and older to request care online for non-urgent symptoms. For details visit [email or web address].   Also download the MyChart app! Go to the app store, search MyChart, open the app, select [place], and log in with your MyChart username and password.  Pembrolizumab  Injection (Keytruda ) What is this medication? PEMBROLIZUMAB  (PEM broe LIZ ue mab) treats some types of cancer. It works by helping your immune system slow or stop the spread of cancer cells. It is a monoclonal antibody. This medicine may be used for other purposes; ask your health care provider or pharmacist if you have questions. COMMON BRAND NAME(S): Keytruda  What should I tell my care team before I take this medication? They need to know if you have any of these conditions: Allogeneic stem cell transplant (uses someone else's stem cells)  Autoimmune diseases, such as Crohn disease, ulcerative colitis, lupus History of chest radiation Nervous system problems, such as Guillain-Barre syndrome, myasthenia gravis Organ transplant An unusual or allergic reaction to pembrolizumab , other medications, foods, dyes, or preservatives Pregnant or trying to get  pregnant Breast-feeding How should I use this medication? This medication is injected into a vein. It is given by your care team in a hospital or clinic setting. A special MedGuide will be given to you before each treatment. Be sure to read this information carefully each time. Talk to your care team about the use of this medication in children. While it may be prescribed for children as young as 6 months for selected conditions, precautions do apply. Overdosage: If you think you have taken too much of this medicine contact a poison control center or emergency room at once. NOTE: This medicine is only for you. Do not share this medicine with others. What if I miss a dose? Keep appointments for follow-up doses. It is important not to miss your dose. Call your care team if you are unable to keep an appointment. What may interact with this medication? Interactions have not been studied. This list may not describe all possible interactions. Give your health care provider a list of all the medicines, herbs, non-prescription drugs, or dietary supplements you use. Also tell them if you smoke, drink alcohol , or use illegal drugs. Some items may interact with your medicine. What should I watch for while using this medication? Your condition will be monitored carefully while you are receiving this medication. You may need blood work while taking this medication. This medication may cause serious skin reactions. They can happen weeks to months after starting the medication. Contact your care team right away if you notice fevers or flu-like symptoms with a rash. The rash may be red or purple and then turn into blisters or peeling of the skin. You may also notice a red rash with swelling of the face, lips, or lymph nodes in your neck or under your arms. Tell your care team right away if you have any change in your eyesight. Talk to your care team if you may be pregnant. Serious birth defects can occur if you  take this medication during pregnancy and for 4 months after the last dose. You will need a negative pregnancy test before starting this medication. Contraception is recommended while taking this medication and for 4 months after the last dose. Your care team can help you find the option that works for you. Do not breastfeed while taking this medication and for 4 months after the last dose. What side effects may I notice from receiving this medication? Side effects that you should report to your care team as soon as possible: Allergic reactions--skin rash, itching, hives, swelling of the face, lips, tongue, or throat Dry cough, shortness of breath or trouble breathing Eye pain, redness, irritation, or discharge with blurry or decreased vision Heart muscle inflammation--unusual weakness or fatigue, shortness of breath, chest pain, fast or irregular heartbeat, dizziness, swelling of the ankles, feet, or hands Hormone gland problems--headache, sensitivity to light, unusual weakness or fatigue, dizziness, fast or irregular heartbeat, increased sensitivity to cold or heat, excessive sweating, constipation, hair loss, increased thirst or amount of urine, tremors or shaking, irritability Infusion reactions--chest pain, shortness of breath or trouble breathing, feeling faint or lightheaded Kidney injury (glomerulonephritis)--decrease in the amount of urine, red or dark brown urine, foamy or bubbly urine, swelling of the ankles, hands, or feet  Liver injury--right upper belly pain, loss of appetite, nausea, light-colored stool, dark yellow or brown urine, yellowing skin or eyes, unusual weakness or fatigue Pain, tingling, or numbness in the hands or feet, muscle weakness, change in vision, confusion or trouble speaking, loss of balance or coordination, trouble walking, seizures Rash, fever, and swollen lymph nodes Redness, blistering, peeling, or loosening of the skin, including inside the mouth Sudden or  severe stomach pain, bloody diarrhea, fever, nausea, vomiting Side effects that usually do not require medical attention (report to your care team if they continue or are bothersome): Bone, joint, or muscle pain Diarrhea Fatigue Loss of appetite Nausea Skin rash This list may not describe all possible side effects. Call your doctor for medical advice about side effects. You may report side effects to FDA at 1-800-FDA-1088. Where should I keep my medication? This medication is given in a hospital or clinic. It will not be stored at home. NOTE: This sheet is a summary. It may not cover all possible information. If you have questions about this medicine, talk to your doctor, pharmacist, or health care provider.  2024 Elsevier/Gold Standard (2021-06-03 00:00:00) Blood Transfusion, Adult A blood transfusion is a procedure in which you receive blood through an IV tube. You may need this procedure because of: A bleeding disorder. An illness. An injury. A surgery. The blood may come from someone else (a donor). You may also be able to donate blood for yourself before a surgery. The blood given in a transfusion may be made up of different types of cells. You may get: Red blood cells. These carry oxygen to the cells in the body. Platelets. These help your blood to clot. Plasma. This is the liquid part of your blood. It carries proteins and other substances through the body. White blood cells. These help you fight infections. If you have a clotting disorder, you may also get other types of blood products. Depending on the type of blood product, this procedure may take 1-4 hours to complete. Tell your doctor about: Any bleeding problems you have. Any reactions you have had during a blood transfusion in the past. Any allergies you have. All medicines you are taking, including vitamins, herbs, eye drops, creams, and over-the-counter medicines. Any surgeries you have had. Any medical conditions you  have. Whether you are pregnant or may be pregnant. What are the risks? Talk with your health care provider about risks. The most common problems include: A mild allergic reaction. This includes red, swollen areas of skin (hives) and itching. Fever or chills. This may be the body's response to new blood cells received. This may happen during or up to 4 hours after the transfusion. More serious problems may include: A serious allergic reaction. This includes breathing trouble or swelling around the face and lips. Too much fluid in the lungs. This may cause breathing problems. Lung injury. This causes breathing trouble and low oxygen in the blood. This can happen within hours of the transfusion or days later. Too much iron . This can happen after getting many blood transfusions over a period of time. An infection or virus passed through the blood. This is rare. Donated blood is carefully tested before it is given. Your body's defense system (immune system) trying to attack the new blood cells. This is rare. Symptoms may include fever, chills, nausea, low blood pressure, and low back or chest pain. Donated cells attacking healthy tissues. This is rare. What happens before the procedure? You will have a blood test  to find out your blood type. The test also finds out what type of blood your body will accept and matches it to the donor type. If you are going to have a planned surgery, you may be able to donate your own blood. This may be done in case you need a transfusion. You will have your temperature, blood pressure, and pulse checked. You may receive medicine to help prevent an allergic reaction. This may be done if you have had a reaction to a transfusion before. This medicine may be given to you by mouth or through an IV tube. What happens during the procedure?  An IV tube will be put into one of your veins. The bag of blood will be attached to your IV tube. Then, the blood will enter through  your vein. Your temperature, blood pressure, and pulse will be checked often. This is done to find early signs of a transfusion reaction. Tell your nurse right away if you have any of these symptoms: Shortness of breath or trouble breathing. Chest or back pain. Fever or chills. Red, swollen areas of skin or itching. If you have any signs or symptoms of a reaction, your transfusion will be stopped. You may also be given medicine. When the transfusion is finished, your IV tube will be taken out. Pressure may be put on the IV site for a few minutes. A bandage (dressing) will be put on the IV site. The procedure may vary among doctors and hospitals. What happens after the procedure? You will be monitored until you leave the hospital or clinic. This includes checking your temperature, blood pressure, pulse, breathing rate, and blood oxygen level. Your blood may be tested to see how you have responded to the transfusion. You may be warmed with fluids or blankets. This is done to keep the temperature of your body normal. If you have your procedure in an outpatient setting, you will be told whom to contact to report any reactions. Where to find more information Visit the American Red Cross: redcross.org Summary A blood transfusion is a procedure in which you receive blood through an IV tube. The blood you are given may be made up of different blood cells. You may receive red blood cells, platelets, plasma, or white blood cells. Your temperature, blood pressure, and pulse will be checked often. After the procedure, your blood may be tested to see how you have responded. This information is not intended to replace advice given to you by your health care provider. Make sure you discuss any questions you have with your health care provider. Document Revised: 04/18/2021 Document Reviewed: 04/18/2021 Elsevier Patient Education  2024 [employer].

## 2023-02-12 NOTE — Progress Notes (Signed)
 [facility] Spiritual Care Note  Followed up with Tina Watkins in infusion so we could meet in person, bringing handmade blessing blanket as a tangible gesture of support and encouragement. Met her SO Tina Watkins as well. Tina Watkins was appreciative, but very sleepy; we plan to follow up by phone next week, which energy and privacy may be more conducive to conversation. She also has direct Spiritual Care number and knows to call as needed/desired.   [address] [name], [place], [facility] Pager [phone] Voicemail [phone]

## 2023-02-13 LAB — T4: T4, Total: 8.6 ug/dL (ref 4.5–12.0)

## 2023-02-15 ENCOUNTER — Encounter: Payer: Self-pay | Admitting: General Practice

## 2023-02-15 ENCOUNTER — Telehealth: Payer: Self-pay | Admitting: Radiation Oncology

## 2023-02-15 ENCOUNTER — Other Ambulatory Visit: Payer: Self-pay | Admitting: Oncology

## 2023-02-15 ENCOUNTER — Inpatient Hospital Stay: Payer: Medicare Other

## 2023-02-15 ENCOUNTER — Telehealth: Payer: Self-pay

## 2023-02-15 ENCOUNTER — Encounter (HOSPITAL_COMMUNITY): Payer: Self-pay | Admitting: *Deleted

## 2023-02-15 ENCOUNTER — Encounter: Payer: Self-pay | Admitting: Nurse Practitioner

## 2023-02-15 ENCOUNTER — Encounter (HOSPITAL_COMMUNITY): Payer: Self-pay | Admitting: Radiology

## 2023-02-15 DIAGNOSIS — Z452 Encounter for adjustment and management of vascular access device: Secondary | ICD-10-CM | POA: Insufficient documentation

## 2023-02-15 DIAGNOSIS — C3490 Malignant neoplasm of unspecified part of unspecified bronchus or lung: Secondary | ICD-10-CM

## 2023-02-15 LAB — TYPE AND SCREEN
ABO/RH(D): B POS
Antibody Screen: NEGATIVE
Unit division: 0

## 2023-02-15 LAB — BPAM RBC
Blood Product Expiration Date: 202502152359
ISSUE DATE / TIME: 202501101319
Unit Type and Rh: 7300

## 2023-02-15 MED ORDER — DEXAMETHASONE 4 MG PO TABS: 4.0000 mg | ORAL_TABLET | Freq: Two times a day (BID) | ORAL | 0 refills | Status: DC

## 2023-02-15 NOTE — Progress Notes (Signed)
 [facility] Spiritual Care Note  Attempted follow-up by phone at an inconvenient time; we plan to try again this afternoon if possible, or tomorrow if needed.   [address] [place], [place], [facility] Pager [phone] Voicemail [phone]

## 2023-02-15 NOTE — Assessment & Plan Note (Signed)
 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  This will need additional evaluation.    -Case reviewed by Dr. Wilhelmenia who recommended workup at a tertiary GI center.  She had biopsy of this on 02/10/2023 at [facility].  Will follow-up on the results to see if she has primary pancreatic neoplasm versus metastatic disease from lung primary.  Further treatment options will depend on biopsy results.  -Her CA 19-9 was undetectable but CEA was elevated at 230 on 12/30/2022.  This will be monitored on future visits.

## 2023-02-15 NOTE — Telephone Encounter (Signed)
-----   Message from Nurse Karn Pickler sent at 02/12/2023  3:22 PM EST ----- Regarding: Dr. Arlana Pouch 1st tx f/u call Dr. Arlana Pouch 1st tx f/u call received keytruda and blood transfusion. Was scheduled for Keytruda/Taxol/ carbo - hgb only 7 had to hold chemo.

## 2023-02-15 NOTE — Telephone Encounter (Signed)
Tina Watkins states that she is doing fine. She is eating, drinking, and urinating well. She knows to call the office at [phone] if  she has any questions or concerns.

## 2023-02-15 NOTE — Assessment & Plan Note (Signed)
 Possibly related to kidney disease. Her mircera is on hold due to the malignancy.   -Order iron  and other relevant tests to determine if replacement is needed.  -She was given 1 unit of PRBCs today.  Since she is on chemotherapy, goal is to maintain hemoglobin closer to 8.

## 2023-02-15 NOTE — Assessment & Plan Note (Addendum)
-  Please review oncology history for additional details and timeline of events.  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  -Her case was discussed in tumor conference on 01/07/2023.  Consensus opinion is that these lung nodules are malignancy related.  Hence clinically it appears to be stage IVa disease.  All treatment options are palliative in nature and not curative in intent and this was explained to patient and her fianc and they verbalized understanding.  -Previously we submitted request for [employer] One mutation testing on the specimen.  It showed no actionable mutations.  -Plan for chemotherapy with immunotherapy or immunotherapy alone, pending the results of the mutation testing.  Since Alimta could not be used because she is on hemodialysis, will plan for carboplatin , paclitaxel , pembrolizumab  for 4 cycles followed by maintenance treatments with pembrolizumab  alone every 3 weeks.  We will dose reduce carboplatin  to AUC 4 and paclitaxel  to 150 mg/m, given dialysis status.  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  She could not even receive PICC line placement and ended up getting midline placed in the left arm.  IR suggested that we should check with nephrology to see if we can use her dialysis catheter for chemotherapy.  Presented to clinic on 02/12/2023 for initiation of treatments.  Her hemoglobin was 7 and she had to be given 1 unit of PRBC transfusion.  We proceeded with pembrolizumab  that day.  Plan to add carboplatin  and paclitaxel  at a later date, pending resolution of IV access issues.  -Will follow-up on workup on pancreatic lesion to see if it is a primary pancreatic malignancy or metastatic from lung primary.  MRI of the brain from 01/29/2023 was resulted today.  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  Started her on Decadron  4 mg p.o. twice daily and referral sent to radiation oncology for further evaluation and management.

## 2023-02-15 NOTE — Progress Notes (Signed)
 Pt called me earlier today asking about starting her chemo. I was not aware at the time that the pt had not been able to take her chemo on 1/10 because she required a blood transfusion. I reached out to scheduler Montie Bihari via [place] who added infusion room charge nurse, Levon Burrs. Per Levon, the pt can come in this Wednesday 1/15 at 7:45 for a midline dressing change and labs followed by her chemotherapy infusion at 9am. I called the pt to give her the details of her appts. Pt denied questions at this time.

## 2023-02-15 NOTE — Telephone Encounter (Signed)
 Patient requested appointment delay due to being scheduled for dialysis Tues and Thurs.

## 2023-02-15 NOTE — Progress Notes (Signed)
 [facility] Spiritual Care Note  Followed up with Tina Watkins by phone. She is interested in registering for our Advance Directives Clinic so that she can designate her significant other as her health care agent; we plan to follow up in person at her infusion on Wednesday for chaplain to provide AD forms and informational brochure for her review.   [address] [place] Corr[place], [place], [facility] Pager [phone] Voicemail [phone]

## 2023-02-16 ENCOUNTER — Other Ambulatory Visit: Payer: Self-pay | Admitting: Oncology

## 2023-02-16 DIAGNOSIS — C3412 Malignant neoplasm of upper lobe, left bronchus or lung: Secondary | ICD-10-CM

## 2023-02-16 MED FILL — Fosaprepitant Dimeglumine For IV Infusion 150 MG (Base Eq): INTRAVENOUS | Qty: 5 | Status: AC

## 2023-02-17 ENCOUNTER — Encounter: Payer: Self-pay | Admitting: Nurse Practitioner

## 2023-02-17 ENCOUNTER — Inpatient Hospital Stay: Payer: Medicare Other

## 2023-02-17 ENCOUNTER — Other Ambulatory Visit: Payer: Self-pay

## 2023-02-17 ENCOUNTER — Encounter: Payer: Self-pay | Admitting: Oncology

## 2023-02-17 ENCOUNTER — Inpatient Hospital Stay (HOSPITAL_BASED_OUTPATIENT_CLINIC_OR_DEPARTMENT_OTHER): Payer: Medicare Other | Admitting: Oncology

## 2023-02-17 ENCOUNTER — Encounter (HOSPITAL_COMMUNITY): Payer: Self-pay | Admitting: *Deleted

## 2023-02-17 ENCOUNTER — Other Ambulatory Visit: Payer: Self-pay | Admitting: Physician Assistant

## 2023-02-17 ENCOUNTER — Encounter: Payer: Self-pay | Admitting: General Practice

## 2023-02-17 VITALS — BP 101/63 | HR 80 | Temp 98.8°F | Resp 20 | Wt 101.0 lb

## 2023-02-17 DIAGNOSIS — D631 Anemia in chronic kidney disease: Secondary | ICD-10-CM

## 2023-02-17 DIAGNOSIS — C3490 Malignant neoplasm of unspecified part of unspecified bronchus or lung: Secondary | ICD-10-CM

## 2023-02-17 DIAGNOSIS — C3412 Malignant neoplasm of upper lobe, left bronchus or lung: Secondary | ICD-10-CM

## 2023-02-17 DIAGNOSIS — K869 Disease of pancreas, unspecified: Secondary | ICD-10-CM | POA: Diagnosis not present

## 2023-02-17 DIAGNOSIS — Z992 Dependence on renal dialysis: Secondary | ICD-10-CM

## 2023-02-17 DIAGNOSIS — N186 End stage renal disease: Secondary | ICD-10-CM | POA: Diagnosis not present

## 2023-02-17 DIAGNOSIS — Z452 Encounter for adjustment and management of vascular access device: Secondary | ICD-10-CM

## 2023-02-17 DIAGNOSIS — Z5112 Encounter for antineoplastic immunotherapy: Secondary | ICD-10-CM | POA: Diagnosis not present

## 2023-02-17 LAB — CMP (CANCER CENTER ONLY)
ALT: 13 U/L (ref 0–44)
AST: 19 U/L (ref 15–41)
Albumin: 3.9 g/dL (ref 3.5–5.0)
Alkaline Phosphatase: 293 U/L — ABNORMAL HIGH (ref 38–126)
Anion gap: 13 (ref 5–15)
BUN: 24 mg/dL — ABNORMAL HIGH (ref 6–20)
CO2: 28 mmol/L (ref 22–32)
Calcium: 9.7 mg/dL (ref 8.9–10.3)
Chloride: 93 mmol/L — ABNORMAL LOW (ref 98–111)
Creatinine: 5.35 mg/dL — ABNORMAL HIGH (ref 0.44–1.00)
GFR, Estimated: 9 mL/min — ABNORMAL LOW (ref >60)
Glucose, Bld: 77 mg/dL (ref 70–99)
Potassium: 3.6 mmol/L (ref 3.5–5.1)
Sodium: 134 mmol/L — ABNORMAL LOW (ref 135–145)
Total Bilirubin: 0.5 mg/dL (ref 0.0–1.2)
Total Protein: 7.2 g/dL (ref 6.5–8.1)

## 2023-02-17 LAB — CBC WITH DIFFERENTIAL (CANCER CENTER ONLY)
Abs Immature Granulocytes: 0.03 10*3/uL (ref 0.00–0.07)
Basophils Absolute: 0 10*3/uL (ref 0.0–0.1)
Basophils Relative: 0 %
Eosinophils Absolute: 0.1 10*3/uL (ref 0.0–0.5)
Eosinophils Relative: 2 %
HCT: 26.7 % — ABNORMAL LOW (ref 36.0–46.0)
Hemoglobin: 8.6 g/dL — ABNORMAL LOW (ref 12.0–15.0)
Immature Granulocytes: 1 %
Lymphocytes Relative: 17 %
Lymphs Abs: 0.6 10*3/uL — ABNORMAL LOW (ref 0.7–4.0)
MCH: 28.5 pg (ref 26.0–34.0)
MCHC: 32.2 g/dL (ref 30.0–36.0)
MCV: 88.4 fL (ref 80.0–100.0)
Monocytes Absolute: 0.4 10*3/uL (ref 0.1–1.0)
Monocytes Relative: 12 %
Neutro Abs: 2.4 10*3/uL (ref 1.7–7.7)
Neutrophils Relative %: 68 %
Platelet Count: 152 10*3/uL (ref 150–400)
RBC: 3.02 MIL/uL — ABNORMAL LOW (ref 3.87–5.11)
RDW: 17.4 % — ABNORMAL HIGH (ref 11.5–15.5)
WBC Count: 3.5 10*3/uL — ABNORMAL LOW (ref 4.0–10.5)
nRBC: 0 % (ref 0.0–0.2)

## 2023-02-17 LAB — TSH: TSH: 9.421 u[IU]/mL — ABNORMAL HIGH (ref 0.350–4.500)

## 2023-02-17 MED ORDER — OXYCODONE HCL 5 MG PO TABS: 10.0000 mg | ORAL_TABLET | Freq: Once | ORAL | Status: DC

## 2023-02-17 MED ORDER — OXYCODONE HCL 5 MG PO TABS
10.0000 mg | ORAL_TABLET | Freq: Once | ORAL | Status: AC
  Administered 2023-02-17: 10 mg via ORAL
  Filled 2023-02-17: qty 2

## 2023-02-17 MED ORDER — SODIUM CHLORIDE 0.9 % IV SOLN
150.0000 mg | Freq: Once | INTRAVENOUS | Status: AC
  Administered 2023-02-17: 150 mg via INTRAVENOUS
  Filled 2023-02-17: qty 150

## 2023-02-17 MED ORDER — ACETAMINOPHEN 325 MG PO TABS
325.0000 mg | ORAL_TABLET | Freq: Once | ORAL | Status: DC
Start: 2023-02-17 — End: 2023-02-17

## 2023-02-17 MED ORDER — SODIUM CHLORIDE 0.9 % IV SOLN: INTRAVENOUS | Status: DC

## 2023-02-17 MED ORDER — SODIUM CHLORIDE 0.9 % IV SOLN
136.8000 mg | Freq: Once | INTRAVENOUS | Status: AC
  Administered 2023-02-17: 140 mg via INTRAVENOUS
  Filled 2023-02-17: qty 14

## 2023-02-17 MED ORDER — DIPHENHYDRAMINE HCL 50 MG/ML IJ SOLN
25.0000 mg | Freq: Once | INTRAMUSCULAR | Status: AC
  Administered 2023-02-17: 25 mg via INTRAVENOUS
  Filled 2023-02-17: qty 1

## 2023-02-17 MED ORDER — PACLITAXEL CHEMO INJECTION 300 MG/50ML
150.0000 mg/m2 | Freq: Once | INTRAVENOUS | Status: AC
  Administered 2023-02-17: 216 mg via INTRAVENOUS
  Filled 2023-02-17: qty 36

## 2023-02-17 MED ORDER — ALTEPLASE 2 MG IJ SOLR
2.0000 mg | Freq: Once | INTRAMUSCULAR | Status: AC
  Administered 2023-02-17: 2 mg
  Filled 2023-02-17: qty 2

## 2023-02-17 MED ORDER — FAMOTIDINE IN NACL 20-0.9 MG/50ML-% IV SOLN
20.0000 mg | Freq: Once | INTRAVENOUS | Status: AC
  Administered 2023-02-17: 20 mg via INTRAVENOUS
  Filled 2023-02-17: qty 50

## 2023-02-17 MED ORDER — DEXAMETHASONE SODIUM PHOSPHATE 10 MG/ML IJ SOLN
10.0000 mg | Freq: Once | INTRAMUSCULAR | Status: AC
  Administered 2023-02-17: 10 mg via INTRAVENOUS
  Filled 2023-02-17: qty 1

## 2023-02-17 MED ORDER — PALONOSETRON HCL INJECTION 0.25 MG/5ML
0.2500 mg | Freq: Once | INTRAVENOUS | Status: AC
  Administered 2023-02-17: 0.25 mg via INTRAVENOUS
  Filled 2023-02-17: qty 5

## 2023-02-17 MED ORDER — ACETAMINOPHEN 325 MG PO TABS
325.0000 mg | ORAL_TABLET | Freq: Once | ORAL | Status: AC
  Administered 2023-02-17: 325 mg via ORAL
  Filled 2023-02-17: qty 1

## 2023-02-17 MED ORDER — SODIUM CHLORIDE 0.9% FLUSH
10.0000 mL | Freq: Once | INTRAVENOUS | Status: AC
  Administered 2023-02-17: 10 mL

## 2023-02-17 NOTE — Patient Instructions (Signed)
 [facility] - A DEPT OF [facility]  Discharge Instructions: Thank you for choosing [facility] to provide your oncology and hematology care.   If you have a lab appointment with the Cancer Center, please go directly to the Cancer Center and check in at the registration area.   Wear comfortable clothing and clothing appropriate for easy access to any Portacath or PICC line.   We strive to give you quality time with your provider. You may need to reschedule your appointment if you arrive late (15 or more minutes).  Arriving late affects you and other patients whose appointments are after yours.  Also, if you miss three or more appointments without notifying the office, you may be dismissed from the clinic at the provider's discretion.      For prescription refill requests, have your pharmacy contact our office and allow 72 hours for refills to be completed.    Today you received the following chemotherapy and/or immunotherapy agents paclitaxol, and carboplatin .      To help prevent nausea and vomiting after your treatment, we encourage you to take your nausea medication as directed.  BELOW ARE SYMPTOMS THAT SHOULD BE REPORTED IMMEDIATELY: *FEVER GREATER THAN 100.4 F (38 C) OR HIGHER *CHILLS OR SWEATING *NAUSEA AND VOMITING THAT IS NOT CONTROLLED WITH YOUR NAUSEA MEDICATION *UNUSUAL SHORTNESS OF BREATH *UNUSUAL BRUISING OR BLEEDING *URINARY PROBLEMS (pain or burning when urinating, or frequent urination) *BOWEL PROBLEMS (unusual diarrhea, constipation, pain near the anus) TENDERNESS IN MOUTH AND THROAT WITH OR WITHOUT PRESENCE OF ULCERS (sore throat, sores in mouth, or a toothache) UNUSUAL RASH, SWELLING OR PAIN  UNUSUAL VAGINAL DISCHARGE OR ITCHING   Items with * indicate a potential emergency and should be followed up as soon as possible or go to the Emergency Department if any problems should occur.  Please show the CHEMOTHERAPY ALERT CARD  or IMMUNOTHERAPY ALERT CARD at check-in to the Emergency Department and triage nurse.  Should you have questions after your visit or need to cancel or reschedule your appointment, please contact [facility] - A DEPT OF [facility]  Dept: [phone]  and follow the prompts.  Office hours are 8:00 a.m. to 4:30 p.m. Monday - Friday. Please note that voicemails left after 4:00 p.m. may not be returned until the following business day.  We are closed weekends and major holidays. You have access to a nurse at all times for urgent questions. Please call the main number to the clinic Dept: [phone] and follow the prompts.   For any non-urgent questions, you may also contact your provider using MyChart. We now offer e-Visits for anyone 86 and older to request care online for non-urgent symptoms. For details visit [email or web address].   Also download the MyChart app! Go to the app store, search "MyChart", open the app, select [place], and log in with your MyChart username and password.  Carboplatin  Injection What is this medication? CARBOPLATIN  (KAR boe pla tin) treats some types of cancer. It works by slowing down the growth of cancer cells. This medicine may be used for other purposes; ask your health care provider or pharmacist if you have questions. COMMON BRAND NAME(S): Paraplatin  What should I tell my care team before I take this medication? They need to know if you have any of these conditions: Blood disorders Hearing problems Kidney disease Recent or ongoing radiation therapy An unusual or allergic reaction to carboplatin ,  cisplatin, other medications, foods, dyes, or preservatives Pregnant or trying to get pregnant Breast-feeding How should I use this medication? This medication is injected into a vein. It is given by your care team in a hospital or clinic setting. Talk to your care team about the use of this medication in children. Special care  may be needed. Overdosage: If you think you have taken too much of this medicine contact a poison control center or emergency room at once. NOTE: This medicine is only for you. Do not share this medicine with others. What if I miss a dose? Keep appointments for follow-up doses. It is important not to miss your dose. Call your care team if you are unable to keep an appointment. What may interact with this medication? Medications for seizures Some antibiotics, such as amikacin, gentamicin, neomycin, streptomycin, tobramycin Vaccines This list may not describe all possible interactions. Give your health care provider a list of all the medicines, herbs, non-prescription drugs, or dietary supplements you use. Also tell them if you smoke, drink alcohol , or use illegal drugs. Some items may interact with your medicine. What should I watch for while using this medication? Your condition will be monitored carefully while you are receiving this medication. You may need blood work while taking this medication. This medication may make you feel generally unwell. This is not uncommon, as chemotherapy can affect healthy cells as well as cancer cells. Report any side effects. Continue your course of treatment even though you feel ill unless your care team tells you to stop. In some cases, you may be given additional medications to help with side effects. Follow all directions for their use. This medication may increase your risk of getting an infection. Call your care team for advice if you get a fever, chills, sore throat, or other symptoms of a cold or flu. Do not treat yourself. Try to avoid being around people who are sick. Avoid taking medications that contain aspirin , acetaminophen , ibuprofen, naproxen, or ketoprofen unless instructed by your care team. These medications may hide a fever. Be careful brushing or flossing your teeth or using a toothpick because you may get an infection or bleed more easily. If  you have any dental work done, tell your dentist you are receiving this medication. Talk to your care team if you wish to become pregnant or think you might be pregnant. This medication can cause serious birth defects. Talk to your care team about effective forms of contraception. Do not breast-feed while taking this medication. What side effects may I notice from receiving this medication? Side effects that you should report to your care team as soon as possible: Allergic reactions--skin rash, itching, hives, swelling of the face, lips, tongue, or throat Infection--fever, chills, cough, sore throat, wounds that don't heal, pain or trouble when passing urine, general feeling of discomfort or being unwell Low red blood cell level--unusual weakness or fatigue, dizziness, headache, trouble breathing Pain, tingling, or numbness in the hands or feet, muscle weakness, change in vision, confusion or trouble speaking, loss of balance or coordination, trouble walking, seizures Unusual bruising or bleeding Side effects that usually do not require medical attention (report to your care team if they continue or are bothersome): Hair loss Nausea Unusual weakness or fatigue Vomiting This list may not describe all possible side effects. Call your doctor for medical advice about side effects. You may report side effects to FDA at 1-800-FDA-1088. Where should I keep my medication? This medication is given in a  hospital or clinic. It will not be stored at home. NOTE: This sheet is a summary. It may not cover all possible information. If you have questions about this medicine, talk to your doctor, pharmacist, or health care provider.  2024 Elsevier/Gold Standard (2021-05-13 00:00:00)  Carboplatin  Injection What is this medication? CARBOPLATIN  (KAR boe pla tin) treats some types of cancer. It works by slowing down the growth of cancer cells. This medicine may be used for other purposes; ask your health care  provider or pharmacist if you have questions. COMMON BRAND NAME(S): Paraplatin  What should I tell my care team before I take this medication? They need to know if you have any of these conditions: Blood disorders Hearing problems Kidney disease Recent or ongoing radiation therapy An unusual or allergic reaction to carboplatin , cisplatin, other medications, foods, dyes, or preservatives Pregnant or trying to get pregnant Breast-feeding How should I use this medication? This medication is injected into a vein. It is given by your care team in a hospital or clinic setting. Talk to your care team about the use of this medication in children. Special care may be needed. Overdosage: If you think you have taken too much of this medicine contact a poison control center or emergency room at once. NOTE: This medicine is only for you. Do not share this medicine with others. What if I miss a dose? Keep appointments for follow-up doses. It is important not to miss your dose. Call your care team if you are unable to keep an appointment. What may interact with this medication? Medications for seizures Some antibiotics, such as amikacin, gentamicin, neomycin, streptomycin, tobramycin Vaccines This list may not describe all possible interactions. Give your health care provider a list of all the medicines, herbs, non-prescription drugs, or dietary supplements you use. Also tell them if you smoke, drink alcohol , or use illegal drugs. Some items may interact with your medicine. What should I watch for while using this medication? Your condition will be monitored carefully while you are receiving this medication. You may need blood work while taking this medication. This medication may make you feel generally unwell. This is not uncommon, as chemotherapy can affect healthy cells as well as cancer cells. Report any side effects. Continue your course of treatment even though you feel ill unless your care team  tells you to stop. In some cases, you may be given additional medications to help with side effects. Follow all directions for their use. This medication may increase your risk of getting an infection. Call your care team for advice if you get a fever, chills, sore throat, or other symptoms of a cold or flu. Do not treat yourself. Try to avoid being around people who are sick. Avoid taking medications that contain aspirin , acetaminophen , ibuprofen, naproxen, or ketoprofen unless instructed by your care team. These medications may hide a fever. Be careful brushing or flossing your teeth or using a toothpick because you may get an infection or bleed more easily. If you have any dental work done, tell your dentist you are receiving this medication. Talk to your care team if you wish to become pregnant or think you might be pregnant. This medication can cause serious birth defects. Talk to your care team about effective forms of contraception. Do not breast-feed while taking this medication. What side effects may I notice from receiving this medication? Side effects that you should report to your care team as soon as possible: Allergic reactions--skin rash, itching, hives, swelling of the  face, lips, tongue, or throat Infection--fever, chills, cough, sore throat, wounds that don't heal, pain or trouble when passing urine, general feeling of discomfort or being unwell Low red blood cell level--unusual weakness or fatigue, dizziness, headache, trouble breathing Pain, tingling, or numbness in the hands or feet, muscle weakness, change in vision, confusion or trouble speaking, loss of balance or coordination, trouble walking, seizures Unusual bruising or bleeding Side effects that usually do not require medical attention (report to your care team if they continue or are bothersome): Hair loss Nausea Unusual weakness or fatigue Vomiting This list may not describe all possible side effects. Call your doctor  for medical advice about side effects. You may report side effects to FDA at 1-800-FDA-1088. Where should I keep my medication? This medication is given in a hospital or clinic. It will not be stored at home. NOTE: This sheet is a summary. It may not cover all possible information. If you have questions about this medicine, talk to your doctor, pharmacist, or health care provider.  2024 Elsevier/Gold Standard (2021-05-13 00:00:00)

## 2023-02-17 NOTE — Progress Notes (Signed)
         During paclitaxol treatment BP's were unable to be obtained due to vascular access issues. Patient also has artificial nails on which is making the pulse and oxygen saturation inaccurate. Dr. Randye Buttner is aware and is in agreement to continue with limited vital signs. Will continue to monitor patient throughout this treatment.

## 2023-02-17 NOTE — Progress Notes (Signed)
 Per Dr. Randye Buttner, ok to administer premedications via midline while awaiting IV team. Dr. Randye Buttner also advised he will decrease diphenhydramine  to 25 mg IV push for each treatment. Vernadine Golas, RN notified of the above.

## 2023-02-17 NOTE — Progress Notes (Signed)
 Opened in error. Please see note from infusion appointment

## 2023-02-17 NOTE — Progress Notes (Signed)
 [facility] Spiritual Care Note  Followed up in infusion as planned. Ms Totino was very sleepy, so her partner [name] took the Advance Directives packet to hold for her. Will follow up by phone to schedule AD clinic visit.    [address] [name], [place], [facility] Pager [phone] Voicemail [phone]

## 2023-02-17 NOTE — Progress Notes (Signed)
[facility]  ONCOLOGY CLINIC PROGRESS NOTE   Patient Care Team: Tina Andrew, NP as PCP - General (Pulmonary Disease) Watkins, Tina Ramp, MD as PCP - Cardiology (Cardiology) Watkins, Tina Buckles, MD as PCP - Advanced Heart Failure (Cardiology) Tina Butte, RN as Oncology Nurse Navigator  PATIENT NAME: Tina Watkins   MR#: [id number] DOB: 08/13/1965  Date of visit: 02/17/2023   ASSESSMENT & PLAN:   [name] is a 58 y.o. lady with multiple medical comorbidities including kidney transplant, currently ESRD on HD, congestive heart failure, atrial fibrillation/flutter, COPD, depression, chronic gout, chronic pain, anemia of chronic disease, gastroparesis, midodrine dependent hypotension, history of strokes, suspected mesenteric ischemia, was recently diagnosed with malignant lung nodule on 11/16/2022 via bronchoscopy.  Immunostains suggestive of adenocarcinoma.   Lung cancer, upper lobe (HCC) -Please review oncology history for additional details and timeline of events.  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  -Her case was discussed in tumor conference on 01/07/2023.  Consensus opinion is that these lung nodules are malignancy related.  Hence clinically it appears to be stage IVa disease.  All treatment options are palliative in nature and not curative in intent and this was explained to patient and her fianc and they verbalized understanding.  -Previously we submitted request for [employer] One mutation testing on the specimen.  It showed no actionable mutations.  -Plan for chemotherapy with immunotherapy or immunotherapy alone.  Since Alimta could not be used because she is on hemodialysis, will plan for carboplatin, paclitaxel, pembrolizumab for 4 cycles followed by maintenance treatments with pembrolizumab alone  every 3 weeks.  We will dose reduce carboplatin to AUC 4 and paclitaxel to 150 mg/m, given dialysis status.  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  She could not even receive PICC line placement and ended up getting midline placed in the left arm.  IR suggested that we should check with nephrology to see if we can use her dialysis catheter for chemotherapy.  Dr. Malen Watkins, her nephrologist approved use of dialysis catheter for chemo, in case it is needed.  However she does not have any other access left and if femoral dialysis catheter were to fail, then she has no other option for dialysis other than possibly a transhepatic catheter and if that fails, then palliative care and death.  -We were able to get peripheral IV inserted using the help of for IV team and proceed with cycle 1 of carboplatin and paclitaxel today (02/17/2023).  She received Keytruda first dose on 02/12/2023.  -Pancreatic lesion biopsy at [facility] on 02/10/2023 came back positive for adenocarcinoma.  Unsure if it is primary pancreatic or metastatic from lung.  Immunostains were not obtained and we will submit request for this.  MRI of the brain from 01/29/2023 showed 1.2 cm enhancing mass in the left insula, consistent with metastatic disease.  Started her on Decadron 4 mg p.o. twice daily and referral sent to radiation oncology for further evaluation and management.  She has an appointment with Dr. Mitzi Watkins on 02/24/2023.  Recent CT of the chest incidentally also showed liver lesions which are new and concerning for metastatic disease.  Will obtain dedicated CT abdomen and pelvis and depending on findings, will consider liver biopsy to determine etiology of liver lesions-lung versus pancreatic primary.  RTC in 1 week for toxicity evaluation.  Lesion of pancreas Heterogeneous pancreatic cystic lesion was not FDG avid on recent PET scan.  However MRI of abdomen was suboptimal but still concerning for  partially cystic malignancy with solid component.   -Case reviewed by Dr. Meridee Watkins who recommended workup at a tertiary GI center.  She had biopsy of this on 02/10/2023 at [facility].  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  However immunostains were not obtained.  Today we requested immunostains to be submitted to determine whether or if it is pancreatic primary versus metastatic disease from lung.  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  Further plan of care to be determined depending on these results.  -Her CA 19-9 was undetectable but CEA was elevated at 230 on 12/30/2022.  This will be monitored on future visits.  Anemia in chronic kidney disease Possibly related to kidney disease. Her mircera is on hold due to the malignancy.   -Order iron and other relevant tests to determine if replacement is needed.  -She was given 1 unit of PRBCs on 02/12/2023.  Hemoglobin is better at 8.6 today.  Since she is on chemotherapy, goal is to maintain hemoglobin closer to 8.     I reviewed lab results and outside records for this visit and discussed relevant results with the patient. Diagnosis, plan of care and treatment options were also discussed in detail with the patient. Opportunity provided to ask questions and answers provided to her apparent satisfaction. Provided instructions to call our clinic with any problems, questions or concerns prior to return visit. I recommended to continue follow-up with PCP and sub-specialists. She verbalized understanding and agreed with the plan.   NCCN guidelines have been consulted in the planning of this patient's care.  I spent a total of 45 minutes during this encounter with the patient including review of chart and various tests results, discussions about plan of care and coordination of care plan.   Tina Crutch, MD   [facility] - A DEPT OF [facility] [address] [place] [address] Dept: [phone] Dept Fax: [phone]    CHIEF COMPLAINT/ REASON FOR VISIT:   Lung adenocarcinoma with multiple bilateral lung nodules, 1 intracranial metastasis.  Stage IV disease.   Current Treatment: Plan for palliative systemic chemoimmunotherapy with carboplatin, paclitaxel and pembrolizumab.  Pemetrexed could not be used because of hemodialysis status.  INTERVAL HISTORY:    Discussed the use of AI scribe software for clinical note transcription with the patient, who gave verbal consent to proceed.   Tina Watkins is here today for repeat clinical assessment.   She denies any new complaints compared to last visit.  She tolerated first dose of Keytruda well last week.  Did not notice any major side effects.  I have reviewed the past medical history, past surgical history, social history and family history with the patient and they are unchanged from previous note.  HISTORY OF PRESENT ILLNESS:   ONCOLOGY HISTORY:    Patient was seen by pulmonology after she was diagnosed with COVID-19 infection in the ED on 09/14/2022.  Because of her chronic smoking history with at least 51-pack-year smoking, a low-dose CT of the chest was obtained for lung cancer screening.  On 09/28/2022, CT chest showed dominant new solid peripheral left upper lobe lung nodule measuring 21 mm.  Several new clustered solid pulmonary nodules throughout left upper lobe, largest measuring 5 mm.  Numerous additional tiny 2 to  3 mm solid pulmonary nodules scattered in both lungs, new finding.  Findings concerning for primary bronchogenic malignancy.  Lung RADS 4B, suspicious.  Also noted was cystic pancreatic tail lesions, largest measuring 2.6 cm posteriorly, indeterminate.  MRI abdomen recommended for further evaluation.   Because of CT chest findings, PET/CT was obtained on 10/19/2022 and it showed 2.6 cm subpleural nodule in the left upper lobe with SUV max of 4.9.  Primary  bronchogenic neoplasm could not be excluded.  Tissue sampling was recommended.  Also noted was 15 mm short axis prevascular lymph node with SUV of 2.4, new compared to 2023.  Indeterminate.  Small left pleural effusion was also noted.   Several subcentimeter lung nodules were noted on CT scan . They are below the threshold by size, for determining FDG activity and hence were not apparent on PET scan.    MRI of the abdomen on 11/11/2022: Evaluation of the pancreatic tail was limited by metallic susceptibility artifact from adjacent surgical clips as well as iron burden in the adjacent stomach parenchyma.  There was a complex, multiseptated, internally heterogeneous cystic lesion arising from the tip of the pancreatic tail measuring 4.4 x 2.3 x 3.2 cm.  Contrast enhancement could not be confirmed due to technical limitations.  Appearance was concerning for a partially cystic malignancy with a significant solid component.  Tissue sampling was recommended.  For future, recommended multiphasic contrast-enhanced CT.  Severe hepatic and splenic iron deposition was noted.  Coarse, nodular contour of the liver, consistent with cirrhosis.  Status post bilateral native nephrectomy.  Right lower quadrant renal transplant graft was noted.  Diffusely heterogeneous bone marrow, reflecting marrow reconversion renal osteodystrophy.   On 11/11/2022, she had multidetector CT imaging of the chest using thin slice collimation for electromagnetic bronchoscopy planning purposes.  It showed similar findings compared to prior imaging.   On 11/16/2022, Dr. Delton Coombes performed robotic assisted navigational bronchoscopy and biopsies.  Left upper lobe lung nodule needle aspiration biopsy showed presence of malignant cells.  Immunostains were obtained at a later date and showed positivity for TTF-1 and negative for p40 and cytokeratin 5/6, consistent with lung adenocarcinoma.  Endobronchial brushings and brushings from left upper lobe lung  nodule showed no malignant cells.   She was admitted to the hospital on 12/18/2022 after she presented with shortness of breath, cough with yellow sputum, chills, myalgias and diarrhea.  She was noted to be in acute hypoxic respiratory failure, related to postobstructive pneumonia.  CT angiogram of the chest on 12/18/2022 showed no evidence of pulmonary embolism.  It did show numerous small nodules throughout right lung which appeared to be increased in number compared to prior study.  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  Stable mediastinal lymphadenopathy.   Underwent left-sided thoracentesis on 12/20/2022.  Pleural fluid cytology showed no evidence of malignant cells.   NGS testing showed no actionable mutations.  Plan made for palliative chemoimmunotherapy with carboplatin, paclitaxel and pembrolizumab.    Port-A-Cath could not be placed because of bad vasculopathy.  She could not even receive PICC line placement and ended up getting midline placed in the left arm.  Presented to clinic on 02/12/2023 for initiation of treatments.  Her hemoglobin was 7 and she had to be given 1 unit of PRBC transfusion.  We proceeded with pembrolizumab that day.  Plan to add carboplatin and paclitaxel at a later date,  pending resolution of IV access issues.  Oncology History  Lung cancer, upper lobe (HCC)  10/28/2022 Initial Diagnosis   Lung cancer, upper lobe (HCC)   01/11/2023 Cancer Staging   Staging form: Lung, AJCC 8th Edition - Clinical: Stage IVA (cTX, cNX, cM1a) - Signed by Tina Crutch, MD on 01/11/2023   02/12/2023 -  Chemotherapy   Patient is on Treatment Plan : LUNG NSCLC Carboplatin (6) + Paclitaxel (200) + Pembrolizumab (200) D1 q21d x 4 cycles / Pembrolizumab (200) Maintenance D1 q21d         REVIEW OF SYSTEMS:   Review of Systems - Oncology  All other  pertinent systems were reviewed with the patient and are negative.  ALLERGIES: She is allergic to levofloxacin and tape.  MEDICATIONS:  Current Outpatient Medications  Medication Sig Dispense Refill   allopurinol (ZYLOPRIM) 100 MG tablet Take 0.5 tablets (50 mg total) by mouth 2 (two) times a week. (Patient taking differently: Take 100 mg by mouth daily in the afternoon.) 90 tablet 3   amiodarone (PACERONE) 200 MG tablet Take 0.5 tablets (100 mg total) by mouth daily. 90 tablet 1   apixaban (ELIQUIS) 2.5 MG TABS tablet Take 1 tablet (2.5 mg total) by mouth 2 (two) times daily. Okay to restart this medication on 11/18/2022     Colchicine 0.6 MG CAPS Take 0.6 mg by mouth daily as needed (gout flare up). 30 capsule 2   cyclobenzaprine (FLEXERIL) 5 MG tablet Take 1 tablet (5 mg total) by mouth at bedtime as needed. 90 tablet 1   dexamethasone (DECADRON) 4 MG tablet Take 2 tablets (8mg ) by mouth daily starting the day after carboplatin for 3 days. Take with food 30 tablet 1   dexamethasone (DECADRON) 4 MG tablet Take 1 tablet (4 mg total) by mouth 2 (two) times daily with a meal. 60 tablet 0   diclofenac Sodium (VOLTAREN) 1 % GEL Apply 4 g topically as needed. 50 g 0   diphenhydrAMINE (BENADRYL) 25 mg capsule Take 1 capsule (25 mg total) by mouth every 8 (eight) hours as needed. 30 capsule 6   dorzolamide-timolol (COSOPT) 2-0.5 % ophthalmic solution Place 1 drop into the left eye 2 (two) times daily.     famotidine (PEPCID) 20 MG tablet TAKE 1 TABLET BY MOUTH TWICE A DAY 180 tablet 3   fluticasone-salmeterol (ADVAIR HFA) 115-21 MCG/ACT inhaler Inhale 2 puffs into the lungs 2 (two) times daily. 1 each 12   gabapentin (NEURONTIN) 100 MG capsule Take 100 mg by mouth 3 (three) times daily as needed (nerve pain).     heparin sodium, porcine, 1000 UNIT/ML injection Inject 1,000 Units into the peritoneum once.     iron sucrose (VENOFER) 20 MG/ML injection Inject 100 mg into the vein one time in dialysis.      lidocaine (LIDODERM) 5 % Place 1 patch onto the skin daily as needed (for pain- remove old patch first).      lidocaine-prilocaine (EMLA) cream Apply to affected area once 30 g 3   metoCLOPramide (REGLAN) 10 MG tablet Take 1 tablet (10 mg total) by mouth 3 (three) times daily before meals. (Patient taking differently: Take 10 mg by mouth daily in the afternoon.) 90 tablet 2   midodrine (PROAMATINE) 10 MG tablet TAKE 1.5 TABLETS (15 MG TOTAL) BY MOUTH 3 (THREE) TIMES DAILY. NEEDS FOLLOW UP APPOINTMENT FOR ANYMORE REFILLS 405 tablet 0   omeprazole (PRILOSEC) 40 MG capsule TAKE 1 CAPSULE (40 MG TOTAL) BY MOUTH DAILY. 90 capsule  1   ondansetron (ZOFRAN) 8 MG tablet Take 1 tablet (8 mg total) by mouth every 8 (eight) hours as needed for nausea or vomiting. Start on the third day after carboplatin. 30 tablet 1   oxyCODONE-acetaminophen (PERCOCET) 10-325 MG tablet Take 1 tablet by mouth 4 (four) times daily as needed for pain.     prochlorperazine (COMPAZINE) 10 MG tablet Take 1 tablet (10 mg total) by mouth every 6 (six) hours as needed for nausea or vomiting. 30 tablet 1   sertraline (ZOLOFT) 25 MG tablet Take 1 tablet (25 mg total) by mouth daily. 30 tablet 3   VELPHORO 500 MG chewable tablet Taking 500 mg by mouth per meal or snack     zolpidem (AMBIEN) 10 MG tablet Take 1 tablet (10 mg total) by mouth at bedtime as needed for sleep. 10 tablet 0   No current facility-administered medications for this visit.   Facility-Administered Medications Ordered in Other Visits  Medication Dose Route Frequency Provider Last Rate Last Admin   0.9 %  sodium chloride infusion   Intravenous Continuous Valisha Heslin, MD 10 mL/hr at 02/17/23 1048 New Bag at 02/17/23 1048   CARBOplatin (PARAPLATIN) 140 mg in sodium chloride 0.9 % 100 mL chemo infusion  140 mg Intravenous Once Jalana Moore, MD       PACLitaxel (TAXOL) 216 mg in sodium chloride 0.9 % 250 mL chemo infusion (> 80mg /m2)  150 mg/m2 (Treatment Plan  Recorded) Intravenous Once Margarete Horace, MD 48 mL/hr at 02/17/23 1301 Rate Change at 02/17/23 1301     VITALS:   There were no vitals taken for this visit.  Wt Readings from Last 3 Encounters:  02/17/23 101 lb 0.6 oz (45.8 kg)  02/03/23 100 lb (45.4 kg)  01/30/23 102 lb 11.8 oz (46.6 kg)    There is no height or weight on file to calculate BMI.  Performance status (ECOG): 2 - Symptomatic, <50% confined to bed  PHYSICAL EXAM:   Physical Exam Constitutional:      General: She is not in acute distress. HENT:     Head: Normocephalic and atraumatic.  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     Heart sounds: Normal heart sounds.  Pulmonary:     Effort: Pulmonary effort is normal.     Breath sounds: Normal breath sounds.  Abdominal:     General: There is no distension.  Musculoskeletal:     Right lower leg: No edema.     Left lower leg: No edema.     Comments: Left arm midline in place  Neurological:     Mental Status: She is alert and oriented to person, place, and time.  Psychiatric:        Mood and Affect: Mood normal.        Behavior: Behavior normal.        Thought Content: Thought content normal.       LABORATORY DATA:   I have reviewed the data as listed.  Results for orders placed or performed in visit on 02/17/23  CMP (Cancer Center only)  Result Value Ref Range   Sodium 134 (L) 135 - 145 mmol/L   Potassium 3.6 3.5 - 5.1 mmol/L   Chloride 93 (L) 98 - 111 mmol/L   CO2 28 22 - 32 mmol/L   Glucose, Bld 77 70 - 99 mg/dL   BUN 24 (H) 6 -  20 mg/dL   Creatinine 5.40 (H) 9.81 - 1.00 mg/dL   Calcium 9.7 8.9 - 19.1 mg/dL   Total Protein 7.2 6.5 - 8.1 g/dL   Albumin 3.9 3.5 - 5.0 g/dL   AST 19 15 - 41 U/L   ALT 13 0 - 44 U/L   Alkaline Phosphatase 293 (H) 38 - 126 U/L   Total Bilirubin 0.5 0.0 - 1.2 mg/dL   GFR, Estimated 9 (L) >60 mL/min   Anion gap 13 5 - 15  CBC with Differential  (Cancer Center Only)  Result Value Ref Range   WBC Count 3.5 (L) 4.0 - 10.5 K/uL   RBC 3.02 (L) 3.87 - 5.11 MIL/uL   Hemoglobin 8.6 (L) 12.0 - 15.0 g/dL   HCT 47.8 (L) 29.5 - 62.1 %   MCV 88.4 80.0 - 100.0 fL   MCH 28.5 26.0 - 34.0 pg   MCHC 32.2 30.0 - 36.0 g/dL   RDW 30.8 (H) 65.7 - 84.6 %   Platelet Count 152 150 - 400 K/uL   nRBC 0.0 0.0 - 0.2 %   Neutrophils Relative % 68 %   Neutro Abs 2.4 1.7 - 7.7 K/uL   Lymphocytes Relative 17 %   Lymphs Abs 0.6 (L) 0.7 - 4.0 K/uL   Monocytes Relative 12 %   Monocytes Absolute 0.4 0.1 - 1.0 K/uL   Eosinophils Relative 2 %   Eosinophils Absolute 0.1 0.0 - 0.5 K/uL   Basophils Relative 0 %   Basophils Absolute 0.0 0.0 - 0.1 K/uL   Immature Granulocytes 1 %   Abs Immature Granulocytes 0.03 0.00 - 0.07 K/uL      RADIOGRAPHIC STUDIES:  I have personally reviewed the radiological images as listed and agree with the findings in the report.  MR Brain W Wo Contrast Result Date: 02/12/2023 CLINICAL DATA:  Non-small cell lung cancer, staging EXAM: MRI HEAD WITHOUT AND WITH CONTRAST TECHNIQUE: Multiplanar, multiecho pulse sequences of the brain and surrounding structures were obtained without and with intravenous contrast. CONTRAST:  5mL GADAVIST GADOBUTROL 1 MMOL/ML IV SOLN COMPARISON:  01/18/2003 MRI head FINDINGS: Brain: Enhancing mass in the left insula, which measures up to 1.2 x 0.9 x 1.1 cm (AP x TR x CC) (series 19, image 74 and series 17, image 19). No other abnormal parenchymal or meningeal enhancement. No evidence of acute or subacute infarct. No acute hemorrhage, mass effect, or midline shift. No hydrocephalus or extra-axial collection. Pituitary and craniocervical junction within normal limits. No hemosiderin deposition to suggest remote hemorrhage. Mildly advanced cerebral volume loss for age. 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 Remote lacunar infarct in the left basal ganglia. T2 hyperintense signal  in the periventricular white matter, likely the sequela of mild-to-moderate chronic small vessel ischemic disease. Vascular: Normal arterial flow voids. Normal arterial and venous enhancement. Skull and upper cervical spine: Normal marrow signal. Sinuses/Orbits: Clear paranasal sinuses. No acute finding in the orbits. Other: Trace fluid in the left mastoid air cells. IMPRESSION: Enhancing mass in the left insula, measuring up to 1.2 cm, consistent with metastatic disease. No other evidence of intracranial metastatic disease. These results will be called to the ordering clinician or representative by the Radiologist Assistant, and communication documented in the PACS or [employer]. Electronically Signed   By: Wiliam Ke M.D.   On: 02/12/2023 13:15   IR PICC PLACEMENT LEFT >5 YRS INC IMG GUIDE Result Date: 02/08/2023 INDICATION: PICC PLACEMENT; New lung cancer dx. Needs chemo  Briefly, 58 year old female with a history of ESRD on HD via a RIGHT femoral catheter. No central stenoses with SVC stent and significant RIGHT chest wall collaterals. Patient previously recommended for port catheter, however no viable central venous access. Midline catheter recommended after provider discussion. EXAM: Procedures: 1. LEFT UPPER EXTREMITY VENOGRAM 2. ULTRASOUND AND FLUOROSCOPIC GUIDED PICC LINE INSERTION MEDICATIONS: None CONTRAST:  10 mL Omnipaque 300 intravenous contrast ANESTHESIA/SEDATION: Local anesthetic was administered. The patient was continuously monitored during the procedure by the interventional radiology nurse under my direct supervision. FLUOROSCOPY TIME:  Fluoroscopic dose; 3 mGy COMPLICATIONS: None immediate. TECHNIQUE: The procedure, risks, benefits, and alternatives were explained to the patient and/or patient's representative and informed written consent was obtained. A timeout was performed prior to the initiation of the procedure. The LEFT upper extremity was prepped with chlorhexidine in a sterile  fashion, and a sterile drape was applied covering the operative field. Maximum barrier sterile technique with sterile gowns and gloves were used for the procedure. A timeout was performed prior to the initiation of the procedure. Local anesthesia was provided with 1% lidocaine. Under direct ultrasound guidance, the LEFT basilic vein was accessed with a micropuncture kit after the overlying soft tissues were anesthetized with 1% lidocaine. An ultrasound image was saved for documentation purposes. A LEFT upper extremity venogram was then performed, demonstrating patent basilic vein to the level of the thoracic inlet. A guidewire was advanced to the level of the superior caval-atrial junction for measurement purposes and the central catheter line was cut to length. A peel-away sheath was placed and a 21 cm, 5 Fr, single lumen was inserted to level of the axillary vein. A post procedure spot fluoroscopic was obtained. The catheter easily aspirated and flushed and was sutured in place. A dressing was placed. The patient tolerated the procedure well without immediate post procedural complication. FINDINGS: *LEFT upper extremity venogram with central venous obstruction at the level of the thoracic inlet. Occluded LEFT innominate vein, with collateral venous drainage into the RIGHT atrium via the hemi-azygous veins. *After catheter placement, the midline catheter tip lies within the axillary vein the catheter aspirates and flushes normally and is ready for immediate use. IMPRESSION: 1. 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 2. Successful ultrasound and fluoroscopic guided placement of a LEFT basilic vein-approach, 21 cm, 5 Fr single lumen midline catheter with tip at the axillary vein. The central line is ready for immediate use. Roanna Banning, MD [facility] Electronically Signed   By: Roanna Banning M.D.   On: 02/08/2023 17:59   CT  Angio Chest PE W and/or Wo Contrast Result Date: 02/03/2023 CLINICAL DATA:  Shortness of breath. History of lung cancer. * Tracking Code: BO * EXAM: CT ANGIOGRAPHY CHEST WITH CONTRAST TECHNIQUE: Multidetector CT imaging of the chest was performed using the standard protocol during bolus administration of intravenous contrast. Multiplanar CT image reconstructions and MIPs were obtained to evaluate the vascular anatomy. RADIATION DOSE REDUCTION: This exam was performed according to the departmental dose-optimization program which includes automated exposure control, adjustment of the mA and/or kV according to patient size and/or use of iterative reconstruction technique. CONTRAST:  75mL OMNIPAQUE IOHEXOL 350 MG/ML SOLN COMPARISON:  12/18/2022 FINDINGS: Cardiovascular: Satisfactory opacification of the pulmonary arteries to the segmental level. No evidence of pulmonary embolism. Web-like filling defect within the distal right main pulmonary artery is unchanged from previous exam and likely represents sequelae of chronic PE. Heart size appears normal. The  main pulmonary artery measures 3.8 cm in diameter. Aortic atherosclerosis and coronary artery calcifications. Mediastinum/Nodes: Diffuse decreased AP diameter of the trachea and lower airways concerning for chronic tracheobronchomalacia. 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 There is a stent noted within the distal SVC. Retrograde opacification of the SVC via a dilated azygous vein is identified, image 146/7. Right paratracheal and subcarinal adenopathy identified. Right paratracheal nodal mass measures 2.6 cm, image 118/7. Previously 1.3 cm. Subcarinal lymph node measures 1.8 cm, image 173/7. Unchanged from previous exam. New right hilar adenopathy measures 1.2 cm, image 174/7. Progressive left-sided pre-vascular adenopathy is also identified. Index lymph node measures 1.8 cm,  image 143/7. Formally 1.5 cm. Lungs/Pleura: There is complete occlusion of the left upper lobe airway with chronic consolidative change throughout the left upper lobe. Underlying neoplastic process is suspected. Moderate left effusion is increased from previous exam. There is been progressive atelectasis and volume loss from the left lower lobe. New small right pleural effusion is identified. Extensive pulmonary nodularity throughout the right lung is identified compatible with metastatic disease. This has progressed when compared with the previous exam. Nodules are increased in multiplicity and size from previous exam. Additionally, there is new marked asymmetric interlobular septal thickening throughout the right lung which may reflect lymphangitic spread of tumor versus pulmonary edema or combination of both. Upper Abdomen: Multiple new low-density foci within the liver are concerning for metastatic disease. Index lesion within anterior dome of liver measures 0.9 cm, image 105/5. Central lobe of liver lesion measures 1.2 cm, image 120/5. No acute findings. Musculoskeletal: No chest wall abnormality. No acute or significant osseous findings. Review of the MIP images confirms the above findings. IMPRESSION: 1. No evidence for acute pulmonary embolus. 2. 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 3. Progressive mediastinal and right hilar adenopathy compatible with progressive metastatic disease. 4. Extensive pulmonary nodularity throughout the right lung is identified compatible with metastatic disease. This has progressed when compared with the previous exam. Nodules are increased in multiplicity and size from previous exam. Additionally, there is new marked asymmetric interlobular septal thickening throughout the right lung which may reflect lymphangitic spread of tumor versus pulmonary edema or combination of both. 5.  Moderate left effusion is increased from previous exam. There has been progressive atelectasis and volume loss from the left lower lobe. 6. 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 7. New small right pleural effusion. 8. Multiple new low-density foci within the liver are concerning for metastatic disease. 9. Diffuse decreased AP diameter of the trachea and lower airways concerning for chronic tracheobronchomalacia. 10. Coronary artery calcifications. 11.  Aortic Atherosclerosis (ICD10-I70.0). Electronically Signed   By: Signa Kell M.D.   On: 02/03/2023 13:11   DG Chest Portable 1 View Result Date: 02/03/2023 CLINICAL DATA:  Chest pain and shortness of breath. EXAM: PORTABLE CHEST 1 VIEW COMPARISON:  12/20/2022 FINDINGS: Stable cardiomediastinal contours scratch sec cardiomediastinal contours are obscured by pulmonary opacities. SVC stent is again seen. Persistent complete opacification of the left upper lobe. Progressive diffuse nodular interstitial opacities throughout both lungs. Which is concerning for progressive metastatic disease given the appearance of numerous tiny nodules noted on CT from 12/18/2022. IMPRESSION: 1. Persistent complete opacification of the left upper lobe. 2. Progressive diffuse nodular  interstitial opacities throughout both lungs concerning for worsening metastatic disease. Electronically Signed   By: Signa Kell M.D.   On: 02/03/2023 10:25    CODE STATUS:  Code Status History     Date Active Date Inactive Code Status Order ID Comments User Context   12/18/2022 1552 12/24/2022 2039 Full Code [id number]  Modena Slater, DO ED   09/06/2021 1325 09/07/2021 2107 Full Code [id number]  Evlyn Kanner, MD ED   03/28/2020 1621 03/29/2020 2052 Full Code [id number]  Emeline General, MD ED   09/01/2019 1812 09/03/2019 2011 Full Code [id number]  Jerald Kief, MD ED   08/05/2019  1443 08/25/2019 0311 Full Code [id number]  Emeline General, MD ED   07/07/2019 1616 07/17/2019 2317 Full Code [id number]  Emeline General, MD ED   05/09/2019 0706 05/18/2019 2040 Full Code [id number]  John Giovanni, MD ED   03/20/2019 1507 03/24/2019 2243 Full Code [id number]  Emeline General, MD Inpatient   11/20/2017 0236 11/20/2017 1916 Full Code [id number]  Lorretta Harp, MD ED   08/09/2017 2312 08/17/2017 1950 Full Code [id number]  Lennox Solders, MD Inpatient   09/29/2015 2318 10/03/2015 1841 Full Code [id number]  Bobette Mo, MD Inpatient   09/29/2015 2318 09/29/2015 2318 Full Code [id number]  Bobette Mo, MD Inpatient   01/08/2015 1637 01/10/2015 1918 Full Code [id number]  Hilda Lias, MD Inpatient   06/13/2014 1008 06/16/2014 1800 Full Code [id number]  Catarina Hartshorn, MD Inpatient   04/10/2012 0109 04/11/2012 2008 Full Code [id number]  Tarry Kos, MD Inpatient   12/10/2011 2204 12/12/2011 1423 Full Code [id number]  Donivan Scull, RN Inpatient   05/22/2011 1520 05/30/2011 1553 Full Code [id number]  Minor, Vilinda Blanks, NP ED    Questions for Most Recent Historical Code Status (Order [id number])     Question Answer   By: Consent: discussion documented in EHR            No orders of the defined types were placed in this encounter.    Future Appointments  Date Time Provider Department Center  02/19/2023  1:00 PM [facility] FLUSH [facility] None  02/19/2023  1:30 PM [facility] FLUSH [facility] None  02/22/2023  7:00 PM [facility]-CT 1 [facility]CT [facility]  02/24/2023  8:00 AM Rice, Jamesetta Orleans, MD [facility] None  02/24/2023 12:30 PM [facility] NURSE [facility] None  02/24/2023  1:00 PM Dorothy Puffer, MD [facility] None  02/25/2023  2:30 PM [facility] FLUSH [facility] None  02/25/2023  3:20 PM Saksham Akkerman, MD [facility] None  03/03/2023  8:30 AM [facility] PA/NP [facility] None  03/04/2023 10:30 AM [facility] FLUSH [facility] None  03/04/2023 11:00 AM Jalise Zawistowski, MD [facility] None  03/31/2023 10:00 AM Leslye Peer, MD [facility] None  04/07/2023 10:40 AM Tina Andrew, NP [facility] None  07/01/2023  1:50 PM [facility]-ANNUAL WELLNESS VISIT [facility] None  07/21/2023  9:30 AM Eustace Pen, PA-C [facility] None      This document was completed utilizing speech recognition software. Grammatical errors, random word insertions, pronoun errors, and incomplete sentences are an occasional consequence of this system due to software limitations, ambient noise, and hardware issues. Any formal questions or concerns about the content, text or information contained within the body of this dictation should be directly addressed to the provider for clarification.

## 2023-02-18 ENCOUNTER — Ambulatory Visit (HOSPITAL_COMMUNITY): Payer: Medicare Other

## 2023-02-18 ENCOUNTER — Telehealth: Payer: Self-pay

## 2023-02-18 ENCOUNTER — Encounter: Payer: Self-pay | Admitting: Oncology

## 2023-02-18 ENCOUNTER — Telehealth: Payer: Self-pay | Admitting: Oncology

## 2023-02-18 ENCOUNTER — Encounter: Payer: Self-pay | Admitting: Nurse Practitioner

## 2023-02-18 ENCOUNTER — Encounter (HOSPITAL_COMMUNITY): Payer: Self-pay | Admitting: *Deleted

## 2023-02-18 NOTE — Telephone Encounter (Signed)
Spoke with Tina Watkins just now on her cell phone and requested she call CT to schedule her Abdomen/Pelvis CT that has been ordered by Dr. Arlana Pouch. Patient has busy schedule including Hemodilaysis so it will be better her to follow up.

## 2023-02-18 NOTE — Progress Notes (Signed)
I reached Christine at [facility] where the pt had her pancreatic biopsy. Per request from Dr. Arlana Pouch, I left a message with Dr Collene Leyden secretary requesting additional testing be done on the pt's tissue to determine if the adenocarcinoma is mets from her lung or primary pancreatic adenocarcinoma. I left my name and contact information and hope to hear back soon.

## 2023-02-18 NOTE — Telephone Encounter (Signed)
 LM for patient that this nurse was calling to see how they were doing after their treatment. Please call back to Dr. Zenda Alpers nurse at [phone] if they have any questions or concerns regarding the treatment.

## 2023-02-18 NOTE — Assessment & Plan Note (Signed)
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   -Case reviewed by Dr. Meridee Score who recommended workup at a tertiary GI center.  She had biopsy of this on 02/10/2023 at [facility].  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  However immunostains were not obtained.  Today we requested immunostains to be submitted to determine whether or if it is pancreatic primary versus metastatic disease from lung.  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  Further plan of care to be determined depending on these results.  -Her CA 19-9 was undetectable but CEA was elevated at 230 on 12/30/2022.  This will be monitored on future visits.

## 2023-02-18 NOTE — Assessment & Plan Note (Signed)
Possibly related to kidney disease. Her mircera is on hold due to the malignancy.   -Order iron and other relevant tests to determine if replacement is needed.  -She was given 1 unit of PRBCs on 02/12/2023.  Hemoglobin is better at 8.6 today.  Since she is on chemotherapy, goal is to maintain hemoglobin closer to 8.

## 2023-02-18 NOTE — Assessment & Plan Note (Addendum)
-  Please review oncology history for additional details and timeline of events.  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  -Her case was discussed in tumor conference on 01/07/2023.  Consensus opinion is that these lung nodules are malignancy related.  Hence clinically it appears to be stage IVa disease.  All treatment options are palliative in nature and not curative in intent and this was explained to patient and her fianc and they verbalized understanding.  -Previously we submitted request for [employer] One mutation testing on the specimen.  It showed no actionable mutations.  -Plan for chemotherapy with immunotherapy or immunotherapy alone.  Since Alimta could not be used because she is on hemodialysis, will plan for carboplatin, paclitaxel, pembrolizumab for 4 cycles followed by maintenance treatments with pembrolizumab alone every 3 weeks.  We will dose reduce carboplatin to AUC 4 and paclitaxel to 150 mg/m, given dialysis status.  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  She could not even receive PICC line placement and ended up getting midline placed in the left arm.  IR suggested that we should check with nephrology to see if we can use her dialysis catheter for chemotherapy.  Dr. Malen Gauze, her nephrologist approved use of dialysis catheter for chemo, in case it is needed.  However she does not have any other access left and if femoral dialysis catheter were to fail, then she has no other option for dialysis other than possibly a transhepatic catheter and if that fails, then palliative care and death.  -We were able to get peripheral IV inserted using the help of for IV team and proceed with cycle 1 of carboplatin and paclitaxel today (02/17/2023).  She received Keytruda first dose on 02/12/2023.  -Pancreatic lesion biopsy at [facility]  on 02/10/2023 came back positive for adenocarcinoma.  Unsure if it is primary pancreatic or metastatic from lung.  Immunostains were not obtained and we will submit request for this.  MRI of the brain from 01/29/2023 showed 1.2 cm enhancing mass in the left insula, consistent with metastatic disease.  Started her on Decadron 4 mg p.o. twice daily and referral sent to radiation oncology for further evaluation and management.  She has an appointment with Dr. Mitzi Hansen on 02/24/2023.  Recent CT of the chest incidentally also showed liver lesions which are new and concerning for metastatic disease.  Will obtain dedicated CT abdomen and pelvis and depending on findings, will consider liver biopsy to determine etiology of liver lesions-lung versus pancreatic primary.  RTC in 1 week for toxicity evaluation.

## 2023-02-18 NOTE — Telephone Encounter (Signed)
 Tina Watkins

## 2023-02-18 NOTE — Telephone Encounter (Signed)
-----   Message from Nurse Guilford Shi sent at 02/17/2023  6:02 PM EST ----- Regarding: Tina Watkins chemo Pt able to complete chemo. First time Watkins.

## 2023-02-19 ENCOUNTER — Inpatient Hospital Stay: Payer: Medicare Other

## 2023-02-19 ENCOUNTER — Ambulatory Visit (HOSPITAL_BASED_OUTPATIENT_CLINIC_OR_DEPARTMENT_OTHER): Payer: Medicare Other

## 2023-02-19 ENCOUNTER — Other Ambulatory Visit: Payer: Self-pay | Admitting: Oncology

## 2023-02-19 VITALS — BP 95/65 | HR 71 | Temp 98.2°F | Resp 19

## 2023-02-19 DIAGNOSIS — Z5112 Encounter for antineoplastic immunotherapy: Secondary | ICD-10-CM | POA: Diagnosis not present

## 2023-02-19 DIAGNOSIS — C3412 Malignant neoplasm of upper lobe, left bronchus or lung: Secondary | ICD-10-CM

## 2023-02-19 MED ORDER — PEGFILGRASTIM-FPGK 6 MG/0.6ML ~~LOC~~ SOSY
6.0000 mg | PREFILLED_SYRINGE | Freq: Once | SUBCUTANEOUS | Status: AC
Start: 2023-02-19 — End: 2023-02-19
  Administered 2023-02-19: 6 mg via SUBCUTANEOUS
  Filled 2023-02-19: qty 0.6

## 2023-02-19 NOTE — Patient Instructions (Addendum)
Pegfilgrastim Injection What is this medication? PEGFILGRASTIM (PEG fil gra stim) lowers the risk of infection in people who are receiving chemotherapy. It works by Systems analyst make more white blood cells, which protects your body from infection. It may also be used to help people who have been exposed to high doses of radiation. This medicine may be used for other purposes; ask your health care provider or pharmacist if you have questions. COMMON BRAND NAME(S): Cherly Hensen, Neulasta, Nyvepria, Stimufend, UDENYCA, UDENYCA ONBODY, Ziextenzo What should I tell my care team before I take this medication? They need to know if you have any of these conditions: Kidney disease Latex allergy Ongoing radiation therapy Sickle cell disease Skin reactions to acrylic adhesives (On-Body Injector only) An unusual or allergic reaction to pegfilgrastim, filgrastim, other medications, foods, dyes, or preservatives Pregnant or trying to get pregnant Breast-feeding How should I use this medication? This medication is for injection under the skin. If you get this medication at home, you will be taught how to prepare and give the pre-filled syringe or how to use the On-body Injector. Refer to the patient Instructions for Use for detailed instructions. Use exactly as directed. Tell your care team immediately if you suspect that the On-body Injector may not have performed as intended or if you suspect the use of the On-body Injector resulted in a missed or partial dose. It is important that you put your used needles and syringes in a special sharps container. Do not put them in a trash can. If you do not have a sharps container, call your pharmacist or care team to get one. Talk to your care team about the use of this medication in children. While this medication may be prescribed for selected conditions, precautions do apply. Overdosage: If you think you have taken too much of this medicine contact a poison  control center or emergency room at once. NOTE: This medicine is only for you. Do not share this medicine with others. What if I miss a dose? It is important not to miss your dose. Call your care team if you miss your dose. If you miss a dose due to an On-body Injector failure or leakage, a new dose should be administered as soon as possible using a single prefilled syringe for manual use. What may interact with this medication? Interactions have not been studied. This list may not describe all possible interactions. Give your health care provider a list of all the medicines, herbs, non-prescription drugs, or dietary supplements you use. Also tell them if you smoke, drink alcohol, or use illegal drugs. Some items may interact with your medicine. What should I watch for while using this medication? Your condition will be monitored carefully while you are receiving this medication. You may need blood work done while you are taking this medication. Talk to your care team about your risk of cancer. You may be more at risk for certain types of cancer if you take this medication. If you are going to need a MRI, CT scan, or other procedure, tell your care team that you are using this medication (On-Body Injector only). What side effects may I notice from receiving this medication? Side effects that you should report to your care team as soon as possible: Allergic reactions--skin rash, itching, hives, swelling of the face, lips, tongue, or throat Capillary leak syndrome--stomach or muscle pain, unusual weakness or fatigue, feeling faint or lightheaded, decrease in the amount of urine, swelling of the ankles, hands, or  feet, trouble breathing High white blood cell level--fever, fatigue, trouble breathing, night sweats, change in vision, weight loss Inflammation of the aorta--fever, fatigue, back, chest, or stomach pain, severe headache Kidney injury (glomerulonephritis)--decrease in the amount of urine, red  or dark brown urine, foamy or bubbly urine, swelling of the ankles, hands, or feet Shortness of breath or trouble breathing Spleen injury--pain in upper left stomach or shoulder Unusual bruising or bleeding Side effects that usually do not require medical attention (report to your care team if they continue or are bothersome): Bone pain Pain in the hands or feet This list may not describe all possible side effects. Call your doctor for medical advice about side effects. You may report side effects to FDA at 1-800-FDA-1088. Where should I keep my medication? Keep out of the reach of children. If you are using this medication at home, you will be instructed on how to store it. Throw away any unused medication after the expiration date on the label. NOTE: This sheet is a summary. It may not cover all possible information. If you have questions about this medicine, talk to your doctor, pharmacist, or health care provider.  2024 Elsevier/Gold Standard (2020-12-20 00:00:00) Pegfilgrastim Injection What is this medication? PEGFILGRASTIM (PEG fil gra stim) lowers the risk of infection in people who are receiving chemotherapy. It works by Systems analyst make more white blood cells, which protects your body from infection. It may also be used to help people who have been exposed to high doses of radiation. This medicine may be used for other purposes; ask your health care provider or pharmacist if you have questions. COMMON BRAND NAME(S): Cherly Hensen, Neulasta, Nyvepria, Stimufend, UDENYCA, UDENYCA ONBODY, Ziextenzo What should I tell my care team before I take this medication? They need to know if you have any of these conditions: Kidney disease Latex allergy Ongoing radiation therapy Sickle cell disease Skin reactions to acrylic adhesives (On-Body Injector only) An unusual or allergic reaction to pegfilgrastim, filgrastim, other medications, foods, dyes, or preservatives Pregnant or  trying to get pregnant Breast-feeding How should I use this medication? This medication is for injection under the skin. If you get this medication at home, you will be taught how to prepare and give the pre-filled syringe or how to use the On-body Injector. Refer to the patient Instructions for Use for detailed instructions. Use exactly as directed. Tell your care team immediately if you suspect that the On-body Injector may not have performed as intended or if you suspect the use of the On-body Injector resulted in a missed or partial dose. It is important that you put your used needles and syringes in a special sharps container. Do not put them in a trash can. If you do not have a sharps container, call your pharmacist or care team to get one. Talk to your care team about the use of this medication in children. While this medication may be prescribed for selected conditions, precautions do apply. Overdosage: If you think you have taken too much of this medicine contact a poison control center or emergency room at once. NOTE: This medicine is only for you. Do not share this medicine with others. What if I miss a dose? It is important not to miss your dose. Call your care team if you miss your dose. If you miss a dose due to an On-body Injector failure or leakage, a new dose should be administered as soon as possible using a single prefilled syringe for manual use.  What may interact with this medication? Interactions have not been studied. This list may not describe all possible interactions. Give your health care provider a list of all the medicines, herbs, non-prescription drugs, or dietary supplements you use. Also tell them if you smoke, drink alcohol, or use illegal drugs. Some items may interact with your medicine. What should I watch for while using this medication? Your condition will be monitored carefully while you are receiving this medication. You may need blood work done while you are  taking this medication. Talk to your care team about your risk of cancer. You may be more at risk for certain types of cancer if you take this medication. If you are going to need a MRI, CT scan, or other procedure, tell your care team that you are using this medication (On-Body Injector only). What side effects may I notice from receiving this medication? Side effects that you should report to your care team as soon as possible: Allergic reactions--skin rash, itching, hives, swelling of the face, lips, tongue, or throat Capillary leak syndrome--stomach or muscle pain, unusual weakness or fatigue, feeling faint or lightheaded, decrease in the amount of urine, swelling of the ankles, hands, or feet, trouble breathing High white blood cell level--fever, fatigue, trouble breathing, night sweats, change in vision, weight loss Inflammation of the aorta--fever, fatigue, back, chest, or stomach pain, severe headache Kidney injury (glomerulonephritis)--decrease in the amount of urine, red or dark brown urine, foamy or bubbly urine, swelling of the ankles, hands, or feet Shortness of breath or trouble breathing Spleen injury--pain in upper left stomach or shoulder Unusual bruising or bleeding Side effects that usually do not require medical attention (report to your care team if they continue or are bothersome): Bone pain Pain in the hands or feet This list may not describe all possible side effects. Call your doctor for medical advice about side effects. You may report side effects to FDA at 1-800-FDA-1088. Where should I keep my medication? Keep out of the reach of children. If you are using this medication at home, you will be instructed on how to store it. Throw away any unused medication after the expiration date on the label. NOTE: This sheet is a summary. It may not cover all possible information. If you have questions about this medicine, talk to your doctor, pharmacist, or health care  provider.  2024 Elsevier/Gold Standard (2020-12-20 00:00:00)

## 2023-02-22 ENCOUNTER — Other Ambulatory Visit: Payer: Self-pay | Admitting: Oncology

## 2023-02-22 ENCOUNTER — Encounter: Payer: Self-pay | Admitting: General Practice

## 2023-02-22 ENCOUNTER — Ambulatory Visit (HOSPITAL_BASED_OUTPATIENT_CLINIC_OR_DEPARTMENT_OTHER)
Admission: RE | Admit: 2023-02-22 | Discharge: 2023-02-22 | Disposition: A | Discharge status: Home / Self Care | Payer: Medicare Other | Source: Ambulatory Visit | Attending: Oncology | Admitting: Oncology

## 2023-02-22 DIAGNOSIS — C3490 Malignant neoplasm of unspecified part of unspecified bronchus or lung: Secondary | ICD-10-CM | POA: Insufficient documentation

## 2023-02-22 DIAGNOSIS — C7931 Secondary malignant neoplasm of brain: Secondary | ICD-10-CM | POA: Insufficient documentation

## 2023-02-22 MED ORDER — IOHEXOL 300 MG/ML  SOLN
100.0000 mL | Freq: Once | INTRAMUSCULAR | Status: AC | PRN
  Administered 2023-02-22: 75 mL via INTRAVENOUS

## 2023-02-22 NOTE — Progress Notes (Signed)
Radiation Oncology         [phone] ________________________________  Name: Tina Watkins        MRN: [id number]  Date of Service: 02/24/2023 DOB: [birth year]  YQ:MVHQION, Tina Pounds, NP  Pasam, Archie Patten, MD     REFERRING PHYSICIAN: Meryl Crutch, MD   DIAGNOSIS: The primary encounter diagnosis was Malignant neoplasm of upper lobe of left lung (HCC). A diagnosis of Metastasis to brain [facility]) was also pertinent to this visit.   HISTORY OF PRESENT ILLNESS: Tina Watkins is a 58 y.o. female seen at the request of Dr. Arlana Pouch For newly diagnosed metastatic cancer.  The patient has multiple comorbidities including end-stage renal disease on hemodialysis congestive heart failure, atrial fibs and flutter, COPD, and history of stroke and mesenteric ischemia.  She was found to have a mass in her left upper lobe and multiple subcentimeter lung nodules during the lung cancer screening CT in August 2024.  A mass measuring 2.1 cm in the left upper lobe was the primary lesion.  She had an enlarged right paratracheal node, and pet imaging showed hypermetabolic activity in the left upper lobe nodule with an SUV of 4.9, the prevascular node had an SUV of 2.4.  She also had a known pancreatic lesion that was not hypermetabolic on PET in September 2024.  MRI abdomen showed a 4.4 cm cystic lesion in the tip of the pancreatic tail.  She underwent bronchoscopy on 11/16/2022 and the fine-needle aspirate biopsy showed malignant cells consistent with adenocarcinoma of lung primary.  She developed a left pleural effusion and underwent a thoracentesis on 12/20/2022 which cytology was negative.  Discussion in thoracic oncology conference on 01/07/2023 it was felt that she has stage IV disease radiographically.  An MRI of the brain was also performed on 01/29/2023 and was reported on 02/12/2023 with a mass in the left insula measuring 1.2 cm concerning for metastatic disease.  She was started on dexamethasone 4 mg tablets to  be taken twice daily with meals.  She was counseled on the rationale for systemic treatment and began cycle #1 of chemotherapy on 02/17/2023.  She is seen to consider radiotherapy for her brain tumor.    PREVIOUS RADIATION THERAPY: {EXAM; YES/NO:19492::"No"}   PAST MEDICAL HISTORY:  Past Medical History:  Diagnosis Date   Anxiety    Bacteremia due to Gram-negative bacteria 05/23/2011   Blind    right eye   Blind right eye    CHF (congestive heart failure) (HCC)    Chronic lower back pain    Complication of anesthesia    "woke up during OR; I have an extremely high tolerance" (12/11/2011) 1 procedure was graft; the other procedures were procedures that are typically done with sedation.   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   Dysrhythmia    "tachycardia" (12/11/2011) new onset afib 10/15/14 EKG   E coli bacteremia 06/18/2011   Elevated LDL cholesterol level 12/2018   ESRD (end stage renal disease) (HCC) 06/12/2011   Tues-Thurs-Sat dialysis   Fibromyalgia    Gastroparesis    GERD (gastroesophageal reflux disease)    Glaucoma    right eye   Gout    H/O carpal tunnel syndrome    Headache 10/2019   no current problem per patient on 11/12/22   Headache(784.0)    "not often anymore" (12/11/2011)   Herpes genitalia 1994   History of blood transfusion    "more than a  few times" (12/11/2011)   History of COVID-19, most recently 09/2022 05/09/2019   History of deep vein thrombosis (DVT) of right lower extremity 11/20/2017   History of stomach ulcers    Hypotension    Iron deficiency anemia    New onset a-fib (HCC)    10/15/14 EKG   Osteopenia    Peripheral neuropathy 11/2018   Pneumonia    x several but years ago   Pressure ulcer of sacral region, stage 1 07/2019   Seizures (HCC) 1994   "post transplant; only have had that one" (12/11/2011)   Spinal stenosis in cervical region    Stroke [facility])     left basal ganglia  lacunar infarct; Right frontal lobe lacunar infarct.   Stroke [facility]) ~ 1999; 2001   "briefly lost my vision; lost my right eye" (12/11/2011)   Vitamin D deficiency 12/2018       PAST SURGICAL HISTORY: Past Surgical History:  Procedure Laterality Date   ANTERIOR CERVICAL DECOMP/DISCECTOMY FUSION N/A 01/08/2015   Procedure: Anterior Cervical Three-Four/Four-Five Decompression/Diskectomy/Fusion;  Surgeon: Hilda Lias, MD;  Location: [facility] NEURO ORS;  Service: Neurosurgery;  Laterality: N/A;  C3-4 C4-5 Anterior cervical decompression/diskectomy/fusion   APPENDECTOMY  ~ 2004   BIOPSY  09/12/2020   Procedure: BIOPSY;  Surgeon: Rachael Fee, MD;  Location: [facility] ENDOSCOPY;  Service: Endoscopy;;   BRONCHIAL BIOPSY  11/16/2022   Procedure: BRONCHIAL BIOPSIES;  Surgeon: Leslye Peer, MD;  Location: [facility] ENDOSCOPY;  Service: Pulmonary;;   BRONCHIAL BRUSHINGS  11/16/2022   Procedure: BRONCHIAL BRUSHINGS;  Surgeon: Leslye Peer, MD;  Location: [facility] ENDOSCOPY;  Service: Pulmonary;;   BRONCHIAL NEEDLE ASPIRATION BIOPSY  11/16/2022   Procedure: BRONCHIAL NEEDLE ASPIRATION BIOPSIES;  Surgeon: Leslye Peer, MD;  Location: [facility] ENDOSCOPY;  Service: Pulmonary;;   CATARACT EXTRACTION     right eye   COLONOSCOPY     COLONOSCOPY WITH PROPOFOL N/A 09/12/2020   Procedure: COLONOSCOPY WITH PROPOFOL;  Surgeon: Rachael Fee, MD;  Location: [facility] ENDOSCOPY;  Service: Endoscopy;  Laterality: N/A;   ENUCLEATION  2001   "right"   ESOPHAGOGASTRODUODENOSCOPY (EGD) WITH PROPOFOL N/A 04/21/2012   Procedure: ESOPHAGOGASTRODUODENOSCOPY (EGD) WITH PROPOFOL;  Surgeon: Rachael Fee, MD;  Location: [facility] ENDOSCOPY;  Service: Endoscopy;  Laterality: N/A;   ESOPHAGOGASTRODUODENOSCOPY (EGD) WITH PROPOFOL N/A 09/12/2020   Procedure: ESOPHAGOGASTRODUODENOSCOPY (EGD) WITH PROPOFOL;  Surgeon: Rachael Fee, MD;  Location: [facility] ENDOSCOPY;  Service: Endoscopy;  Laterality: N/A;   FIDUCIAL MARKER PLACEMENT  11/16/2022   Procedure:  FIDUCIAL MARKER PLACEMENT;  Surgeon: Leslye Peer, MD;  Location: [facility] ENDOSCOPY;  Service: Pulmonary;;   HEMOSTASIS CONTROL  11/16/2022   Procedure: HEMOSTASIS CONTROL;  Surgeon: Leslye Peer, MD;  Location: [facility] ENDOSCOPY;  Service: Pulmonary;;   INSERTION OF DIALYSIS CATHETER  1988   "AV graft LUA & LFA; LUA worked for 1 day; LFA never worked"   IR FLUORO GUIDE CV LINE RIGHT  07/11/2019   IR FLUORO GUIDE CV LINE RIGHT  10/20/2019   IR FLUORO GUIDE CV LINE RIGHT  05/31/2020   IR PTA VENOUS EXCEPT DIALYSIS CIRCUIT  05/31/2020   IR RADIOLOGY PERIPHERAL GUIDED IV START  07/11/2019   IR US GUIDE VASC ACCESS RIGHT  07/11/2019   IR US GUIDE VASC ACCESS RIGHT  07/11/2019   IR US GUIDE VASC ACCESS RIGHT  07/11/2019   IR VENOCAVAGRAM IVC  05/31/2020   KIDNEY TRANSPLANT  1994; 1999; 2005   "right"   MULTIPLE TOOTH EXTRACTIONS  POLYPECTOMY  09/12/2020   Procedure: POLYPECTOMY;  Surgeon: Rachael Fee, MD;  Location: [facility] ENDOSCOPY;  Service: Endoscopy;;   RIGHT HEART CATH N/A 03/23/2019   Procedure: RIGHT HEART CATH;  Surgeon: Dolores Patty, MD;  Location: [facility] INVASIVE CV LAB;  Service: Cardiovascular;  Laterality: N/A;   TONSILLECTOMY     TOTAL NEPHRECTOMY  1988?; 1994; 2005   VIDEO BRONCHOSCOPY WITH RADIAL ENDOBRONCHIAL ULTRASOUND  11/16/2022   Procedure: VIDEO BRONCHOSCOPY WITH RADIAL ENDOBRONCHIAL ULTRASOUND;  Surgeon: Leslye Peer, MD;  Location: [facility] ENDOSCOPY;  Service: Pulmonary;;     FAMILY HISTORY:  Family History  Problem Relation Age of Onset   Glaucoma Mother    Pancreatic cancer Father    Multiple sclerosis Brother    Diabetes Maternal Uncle    Hypertension Maternal Grandmother    Breast cancer Neg Hx      SOCIAL HISTORY:  reports that she has been smoking cigarettes. She has a 52.5 pack-year smoking history. She has never been exposed to tobacco smoke. She has never used smokeless tobacco. She reports that she does not currently use alcohol. She reports current drug use.  Frequency: 1.00 time per week. Drug: Marijuana.   ALLERGIES: Levofloxacin and Tape   MEDICATIONS:  Current Outpatient Medications  Medication Sig Dispense Refill   allopurinol (ZYLOPRIM) 100 MG tablet Take 0.5 tablets (50 mg total) by mouth 2 (two) times a week. (Patient taking differently: Take 100 mg by mouth daily in the afternoon.) 90 tablet 3   amiodarone (PACERONE) 200 MG tablet Take 0.5 tablets (100 mg total) by mouth daily. 90 tablet 1   apixaban (ELIQUIS) 2.5 MG TABS tablet Take 1 tablet (2.5 mg total) by mouth 2 (two) times daily. Okay to restart this medication on 11/18/2022     Colchicine 0.6 MG CAPS Take 0.6 mg by mouth daily as needed (gout flare up). 30 capsule 2   cyclobenzaprine (FLEXERIL) 5 MG tablet Take 1 tablet (5 mg total) by mouth at bedtime as needed. 90 tablet 1   dexamethasone (DECADRON) 4 MG tablet Take 2 tablets (8mg ) by mouth daily starting the day after carboplatin for 3 days. Take with food 30 tablet 1   dexamethasone (DECADRON) 4 MG tablet Take 1 tablet (4 mg total) by mouth 2 (two) times daily with a meal. 60 tablet 0   diclofenac Sodium (VOLTAREN) 1 % GEL Apply 4 g topically as needed. 50 g 0   diphenhydrAMINE (BENADRYL) 25 mg capsule Take 1 capsule (25 mg total) by mouth every 8 (eight) hours as needed. 30 capsule 6   dorzolamide-timolol (COSOPT) 2-0.5 % ophthalmic solution Place 1 drop into the left eye 2 (two) times daily.     famotidine (PEPCID) 20 MG tablet TAKE 1 TABLET BY MOUTH TWICE A DAY 180 tablet 3   fluticasone-salmeterol (ADVAIR HFA) 115-21 MCG/ACT inhaler Inhale 2 puffs into the lungs 2 (two) times daily. 1 each 12   gabapentin (NEURONTIN) 100 MG capsule Take 100 mg by mouth 3 (three) times daily as needed (nerve pain).     heparin sodium, porcine, 1000 UNIT/ML injection Inject 1,000 Units into the peritoneum once.     iron sucrose (VENOFER) 20 MG/ML injection Inject 100 mg into the vein one time in dialysis.     lidocaine (LIDODERM) 5 % Place 1  patch onto the skin daily as needed (for pain- remove old patch first).      lidocaine-prilocaine (EMLA) cream Apply to affected area once 30 g 3  metoCLOPramide (REGLAN) 10 MG tablet Take 1 tablet (10 mg total) by mouth 3 (three) times daily before meals. (Patient taking differently: Take 10 mg by mouth daily in the afternoon.) 90 tablet 2   midodrine (PROAMATINE) 10 MG tablet TAKE 1.5 TABLETS (15 MG TOTAL) BY MOUTH 3 (THREE) TIMES DAILY. NEEDS FOLLOW UP APPOINTMENT FOR ANYMORE REFILLS 405 tablet 0   omeprazole (PRILOSEC) 40 MG capsule TAKE 1 CAPSULE (40 MG TOTAL) BY MOUTH DAILY. 90 capsule 1   ondansetron (ZOFRAN) 8 MG tablet Take 1 tablet (8 mg total) by mouth every 8 (eight) hours as needed for nausea or vomiting. Start on the third day after carboplatin. 30 tablet 1   oxyCODONE-acetaminophen (PERCOCET) 10-325 MG tablet Take 1 tablet by mouth 4 (four) times daily as needed for pain.     prochlorperazine (COMPAZINE) 10 MG tablet Take 1 tablet (10 mg total) by mouth every 6 (six) hours as needed for nausea or vomiting. 30 tablet 1   sertraline (ZOLOFT) 25 MG tablet Take 1 tablet (25 mg total) by mouth daily. 30 tablet 3   VELPHORO 500 MG chewable tablet Taking 500 mg by mouth per meal or snack     zolpidem (AMBIEN) 10 MG tablet Take 1 tablet (10 mg total) by mouth at bedtime as needed for sleep. 10 tablet 0   No current facility-administered medications for this visit.     REVIEW OF SYSTEMS: On review of systems, the patient reports that *** is doing well overall. *** denies any chest pain, shortness of breath, cough, fevers, chills, night sweats, unintended weight changes. *** denies any bowel or bladder disturbances, and denies abdominal pain, nausea or vomiting. *** denies any new musculoskeletal or joint aches or pains. A complete review of systems is obtained and is otherwise negative.     PHYSICAL EXAM:  Wt Readings from Last 3 Encounters:  02/17/23 101 lb 0.6 oz (45.8 kg)  02/03/23  100 lb (45.4 kg)  01/30/23 102 lb 11.8 oz (46.6 kg)   Temp Readings from Last 3 Encounters:  02/19/23 98.2 F (36.8 C) (Oral)  02/17/23 98.8 F (37.1 C) (Oral)  02/12/23 98.4 F (36.9 C) (Oral)   BP Readings from Last 3 Encounters:  02/19/23 95/65  02/17/23 101/63  02/12/23 (!) 143/87   Pulse Readings from Last 3 Encounters:  02/19/23 71  02/17/23 80  02/12/23 84    /10  In general this is a well appearing *** in no acute distress. ***'s alert and oriented x4 and appropriate throughout the examination. Cardiopulmonary assessment is negative for acute distress and *** exhibits normal effort.     ECOG = ***  0 - Asymptomatic (Fully active, able to carry on all predisease activities without restriction)  1 - Symptomatic but completely ambulatory (Restricted in physically strenuous activity but ambulatory and able to carry out work of a light or sedentary nature. For example, light housework, office work)  2 - Symptomatic, <50% in bed during the day (Ambulatory and capable of all self care but unable to carry out any work activities. Up and about more than 50% of waking hours)  3 - Symptomatic, >50% in bed, but not bedbound (Capable of only limited self-care, confined to bed or chair 50% or more of waking hours)  4 - Bedbound (Completely disabled. Cannot carry on any self-care. Totally confined to bed or chair)  5 - Death   Santiago Glad MM, Creech RH, Tormey DC, et al. [phone]). "Toxicity and response criteria of the [place]  Cooperative Oncology Group". Am. Evlyn Clines. Oncol. 5 (6): 649-55    LABORATORY DATA:  Lab Results  Component Value Date   WBC 3.5 (L) 02/17/2023   HGB 8.6 (L) 02/17/2023   HCT 26.7 (L) 02/17/2023   MCV 88.4 02/17/2023   PLT 152 02/17/2023   Lab Results  Component Value Date   NA 134 (L) 02/17/2023   K 3.6 02/17/2023   CL 93 (L) 02/17/2023   CO2 28 02/17/2023   Lab Results  Component Value Date   ALT 13 02/17/2023   AST 19 02/17/2023   ALKPHOS 293  (H) 02/17/2023   BILITOT 0.5 02/17/2023      RADIOGRAPHY: MR Brain W Wo Contrast Result Date: 02/12/2023 CLINICAL DATA:  Non-small cell lung cancer, staging EXAM: MRI HEAD WITHOUT AND WITH CONTRAST TECHNIQUE: Multiplanar, multiecho pulse sequences of the brain and surrounding structures were obtained without and with intravenous contrast. CONTRAST:  5mL GADAVIST GADOBUTROL 1 MMOL/ML IV SOLN COMPARISON:  01/18/2003 MRI head FINDINGS: Brain: Enhancing mass in the left insula, which measures up to 1.2 x 0.9 x 1.1 cm (AP x TR x CC) (series 19, image 74 and series 17, image 19). No other abnormal parenchymal or meningeal enhancement. No evidence of acute or subacute infarct. No acute hemorrhage, mass effect, or midline shift. No hydrocephalus or extra-axial collection. Pituitary and craniocervical junction within normal limits. No hemosiderin deposition to suggest remote hemorrhage. Mildly advanced cerebral volume loss for age. 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 Remote lacunar infarct in the left basal ganglia. T2 hyperintense signal in the periventricular white matter, likely the sequela of mild-to-moderate chronic small vessel ischemic disease. Vascular: Normal arterial flow voids. Normal arterial and venous enhancement. Skull and upper cervical spine: Normal marrow signal. Sinuses/Orbits: Clear paranasal sinuses. No acute finding in the orbits. Other: Trace fluid in the left mastoid air cells. IMPRESSION: Enhancing mass in the left insula, measuring up to 1.2 cm, consistent with metastatic disease. No other evidence of intracranial metastatic disease. These results will be called to the ordering clinician or representative by the Radiologist Assistant, and communication documented in the PACS or [employer]. Electronically Signed   By: Wiliam Ke M.D.   On: 02/12/2023 13:15   IR PICC PLACEMENT LEFT >5 YRS INC IMG GUIDE Result Date: 02/08/2023 INDICATION: PICC  PLACEMENT; New lung cancer dx. Needs chemo Briefly, 58 year old female with a history of ESRD on HD via a RIGHT femoral catheter. No central stenoses with SVC stent and significant RIGHT chest wall collaterals. Patient previously recommended for port catheter, however no viable central venous access. Midline catheter recommended after provider discussion. EXAM: Procedures: 1. LEFT UPPER EXTREMITY VENOGRAM 2. ULTRASOUND AND FLUOROSCOPIC GUIDED PICC LINE INSERTION MEDICATIONS: None CONTRAST:  10 mL Omnipaque 300 intravenous contrast ANESTHESIA/SEDATION: Local anesthetic was administered. The patient was continuously monitored during the procedure by the interventional radiology nurse under my direct supervision. FLUOROSCOPY TIME:  Fluoroscopic dose; 3 mGy COMPLICATIONS: None immediate. TECHNIQUE: The procedure, risks, benefits, and alternatives were explained to the patient and/or patient's representative and informed written consent was obtained. A timeout was performed prior to the initiation of the procedure. The LEFT upper extremity was prepped with chlorhexidine in a sterile fashion, and a sterile drape was applied covering the operative field. Maximum barrier sterile technique with sterile gowns and gloves were used for the procedure. A timeout was performed prior to the initiation of the procedure. Local anesthesia was provided with 1% lidocaine. Under direct  ultrasound guidance, the LEFT basilic vein was accessed with a micropuncture kit after the overlying soft tissues were anesthetized with 1% lidocaine. An ultrasound image was saved for documentation purposes. A LEFT upper extremity venogram was then performed, demonstrating patent basilic vein to the level of the thoracic inlet. A guidewire was advanced to the level of the superior caval-atrial junction for measurement purposes and the central catheter line was cut to length. A peel-away sheath was placed and a 21 cm, 5 Fr, single lumen was inserted to  level of the axillary vein. A post procedure spot fluoroscopic was obtained. The catheter easily aspirated and flushed and was sutured in place. A dressing was placed. The patient tolerated the procedure well without immediate post procedural complication. FINDINGS: *LEFT upper extremity venogram with central venous obstruction at the level of the thoracic inlet. Occluded LEFT innominate vein, with collateral venous drainage into the RIGHT atrium via the hemi-azygous veins. *After catheter placement, the midline catheter tip lies within the axillary vein the catheter aspirates and flushes normally and is ready for immediate use. IMPRESSION: 1. 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 2. Successful ultrasound and fluoroscopic guided placement of a LEFT basilic vein-approach, 21 cm, 5 Fr single lumen midline catheter with tip at the axillary vein. The central line is ready for immediate use. Roanna Banning, MD [facility] Electronically Signed   By: Roanna Banning M.D.   On: 02/08/2023 17:59   CT Angio Chest PE W and/or Wo Contrast Result Date: 02/03/2023 CLINICAL DATA:  Shortness of breath. History of lung cancer. * Tracking Code: BO * EXAM: CT ANGIOGRAPHY CHEST WITH CONTRAST TECHNIQUE: Multidetector CT imaging of the chest was performed using the standard protocol during bolus administration of intravenous contrast. Multiplanar CT image reconstructions and MIPs were obtained to evaluate the vascular anatomy. RADIATION DOSE REDUCTION: This exam was performed according to the departmental dose-optimization program which includes automated exposure control, adjustment of the mA and/or kV according to patient size and/or use of iterative reconstruction technique. CONTRAST:  75mL OMNIPAQUE IOHEXOL 350 MG/ML SOLN COMPARISON:  12/18/2022 FINDINGS: Cardiovascular: Satisfactory opacification of the pulmonary arteries to the segmental  level. No evidence of pulmonary embolism. Web-like filling defect within the distal right main pulmonary artery is unchanged from previous exam and likely represents sequelae of chronic PE. Heart size appears normal. The main pulmonary artery measures 3.8 cm in diameter. Aortic atherosclerosis and coronary artery calcifications. Mediastinum/Nodes: Diffuse decreased AP diameter of the trachea and lower airways concerning for chronic tracheobronchomalacia. 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 There is a stent noted within the distal SVC. Retrograde opacification of the SVC via a dilated azygous vein is identified, image 146/7. Right paratracheal and subcarinal adenopathy identified. Right paratracheal nodal mass measures 2.6 cm, image 118/7. Previously 1.3 cm. Subcarinal lymph node measures 1.8 cm, image 173/7. Unchanged from previous exam. New right hilar adenopathy measures 1.2 cm, image 174/7. Progressive left-sided pre-vascular adenopathy is also identified. Index lymph node measures 1.8 cm, image 143/7. Formally 1.5 cm. Lungs/Pleura: There is complete occlusion of the left upper lobe airway with chronic consolidative change throughout the left upper lobe. Underlying neoplastic process is suspected. Moderate left effusion is increased from previous exam. There is been progressive atelectasis and volume loss from the left lower lobe. New small right pleural effusion is identified. Extensive pulmonary nodularity throughout the right lung is identified  compatible with metastatic disease. This has progressed when compared with the previous exam. Nodules are increased in multiplicity and size from previous exam. Additionally, there is new marked asymmetric interlobular septal thickening throughout the right lung which may reflect lymphangitic spread of tumor versus pulmonary edema or combination of both. Upper Abdomen: Multiple  new low-density foci within the liver are concerning for metastatic disease. Index lesion within anterior dome of liver measures 0.9 cm, image 105/5. Central lobe of liver lesion measures 1.2 cm, image 120/5. No acute findings. Musculoskeletal: No chest wall abnormality. No acute or significant osseous findings. Review of the MIP images confirms the above findings. IMPRESSION: 1. No evidence for acute pulmonary embolus. 2. 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 3. Progressive mediastinal and right hilar adenopathy compatible with progressive metastatic disease. 4. Extensive pulmonary nodularity throughout the right lung is identified compatible with metastatic disease. This has progressed when compared with the previous exam. Nodules are increased in multiplicity and size from previous exam. Additionally, there is new marked asymmetric interlobular septal thickening throughout the right lung which may reflect lymphangitic spread of tumor versus pulmonary edema or combination of both. 5. Moderate left effusion is increased from previous exam. There has been progressive atelectasis and volume loss from the left lower lobe. 6. 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 7. New small right pleural effusion. 8. Multiple new low-density foci within the liver are concerning for metastatic disease. 9. Diffuse decreased AP diameter of the trachea and lower airways concerning for chronic tracheobronchomalacia. 10. Coronary artery calcifications. 11.  Aortic Atherosclerosis (ICD10-I70.0). Electronically Signed   By: Signa Kell M.D.   On: 02/03/2023 13:11   DG Chest Portable 1 View Result Date: 02/03/2023 CLINICAL DATA:  Chest pain and shortness of breath. EXAM: PORTABLE CHEST 1 VIEW COMPARISON:  12/20/2022  FINDINGS: Stable cardiomediastinal contours scratch sec cardiomediastinal contours are obscured by pulmonary opacities. SVC stent is again seen. Persistent complete opacification of the left upper lobe. Progressive diffuse nodular interstitial opacities throughout both lungs. Which is concerning for progressive metastatic disease given the appearance of numerous tiny nodules noted on CT from 12/18/2022. IMPRESSION: 1. Persistent complete opacification of the left upper lobe. 2. Progressive diffuse nodular interstitial opacities throughout both lungs concerning for worsening metastatic disease. Electronically Signed   By: Signa Kell M.D.   On: 02/03/2023 10:25       IMPRESSION/PLAN: 1. 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 Dr. Mitzi Hansen discusses the pathology findings and reviews the nature of metastatic lung cancer.  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  She will continue to receive chemotherapy as well as immunotherapy.  Dr. Hali Marry discusses the need for 3T MRI scan for treatment planning and to make sure she is a candidate for stereotactic radiosurgery.  He also discusses their input and role of neurosurgery in the treatment planning and delivery so we will coordinate with neurosurgery to see her in consultation.  We discussed the risks, benefits, short, and long term effects of radiotherapy, as well as the curative intent, and the patient is interested in proceeding. Dr. Mitzi Hansen discusses the delivery and logistics of radiotherapy and anticipates a single fraction of radiosurgery.  We will introduce her to our special procedures navigator to coordinate simulation,  MRI, neurosurgery consultation, and treatment. 2. Pancreatic tail lesion.  This will be followed in surveillance.  In a visit lasting *** minutes, greater than 50% of the time was spent face to face discussing the patient's condition,  in preparation for the discussion, and coordinating the patient's care.   The above documentation reflects my direct findings during this shared patient visit. Please see the separate note by Dr. Mitzi Hansen on this date for the remainder of the patient's plan of care.    Osker Mason, [facility]   **Disclaimer: This note was dictated with voice recognition software. Similar sounding words can inadvertently be transcribed and this note may contain transcription errors which may not have been corrected upon publication of note.**

## 2023-02-22 NOTE — Progress Notes (Signed)
[facility] Spiritual Care Note  Attempted follow-up by phone re Ms Piscitelli's questions about Advance Directives and possibly registering for [facility] here at [facility], leaving voicemail with encouragement to return call.   [address] [place], [place], [employer] Pager [phone] Voicemail [phone]

## 2023-02-23 ENCOUNTER — Other Ambulatory Visit: Payer: Self-pay

## 2023-02-23 NOTE — Progress Notes (Signed)
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 No other evidence of intracranial metastatic disease.   Past or anticipated interventions, if any, per neurosurgery:    Past or anticipated interventions, if any, per medical oncology:  Dr. Arlana Pouch -Chemotherapy start 02/17/2023.   Dose of Decadron, if applicable: Has not been taking.   Recent neurologic symptoms, if any:  Seizures: No Headaches: She reports daily headaches.  She reports dull achy. Nausea: No Dizziness/ataxia: No Difficulty with hand coordination: No Speech: Clear Focal numbness/weakness: She reports some weakness in her extremities. Visual deficits/changes: No Confusion/Memory deficits: She reports a little memory loss.    SAFETY ISSUES: Prior radiation? No Pacemaker/ICD? No Possible current pregnancy? Postmenopausal Is the patient on methotrexate? No  Additional Complaints / other details:  Dialysis: Tu/Th/Sat

## 2023-02-24 ENCOUNTER — Telehealth: Payer: Self-pay | Admitting: Oncology

## 2023-02-24 ENCOUNTER — Ambulatory Visit: Payer: Medicare Other | Admitting: Internal Medicine

## 2023-02-24 ENCOUNTER — Telehealth: Payer: Self-pay

## 2023-02-24 ENCOUNTER — Ambulatory Visit
Admission: RE | Admit: 2023-02-24 | Discharge: 2023-02-24 | Discharge status: Home / Self Care | Payer: Medicare Other | Source: Ambulatory Visit | Attending: Radiation Oncology | Admitting: Radiation Oncology

## 2023-02-24 ENCOUNTER — Ambulatory Visit
Admission: RE | Admit: 2023-02-24 | Discharge: 2023-02-24 | Disposition: A | Discharge status: Home / Self Care | Payer: Medicare Other | Source: Ambulatory Visit | Attending: Radiation Oncology | Admitting: Radiation Oncology

## 2023-02-24 ENCOUNTER — Encounter: Payer: Self-pay | Admitting: Radiation Oncology

## 2023-02-24 VITALS — BP 96/62 | HR 67 | Temp 97.8°F | Resp 20 | Ht 59.0 in | Wt 105.6 lb

## 2023-02-24 DIAGNOSIS — Z992 Dependence on renal dialysis: Secondary | ICD-10-CM | POA: Diagnosis not present

## 2023-02-24 DIAGNOSIS — J449 Chronic obstructive pulmonary disease, unspecified: Secondary | ICD-10-CM | POA: Diagnosis not present

## 2023-02-24 DIAGNOSIS — Z79899 Other long term (current) drug therapy: Secondary | ICD-10-CM | POA: Diagnosis not present

## 2023-02-24 DIAGNOSIS — Z8673 Personal history of transient ischemic attack (TIA), and cerebral infarction without residual deficits: Secondary | ICD-10-CM | POA: Diagnosis not present

## 2023-02-24 DIAGNOSIS — N186 End stage renal disease: Secondary | ICD-10-CM | POA: Insufficient documentation

## 2023-02-24 DIAGNOSIS — K7460 Unspecified cirrhosis of liver: Secondary | ICD-10-CM | POA: Insufficient documentation

## 2023-02-24 DIAGNOSIS — C7931 Secondary malignant neoplasm of brain: Secondary | ICD-10-CM | POA: Insufficient documentation

## 2023-02-24 DIAGNOSIS — Z800 Family history of malignant neoplasm of digestive organs: Secondary | ICD-10-CM | POA: Insufficient documentation

## 2023-02-24 DIAGNOSIS — M1A3720 Chronic gout due to renal impairment, left ankle and foot, without tophus (tophi): Secondary | ICD-10-CM

## 2023-02-24 DIAGNOSIS — F1290 Cannabis use, unspecified, uncomplicated: Secondary | ICD-10-CM | POA: Insufficient documentation

## 2023-02-24 DIAGNOSIS — E1122 Type 2 diabetes mellitus with diabetic chronic kidney disease: Secondary | ICD-10-CM | POA: Insufficient documentation

## 2023-02-24 DIAGNOSIS — Z8616 Personal history of COVID-19: Secondary | ICD-10-CM | POA: Diagnosis not present

## 2023-02-24 DIAGNOSIS — E1143 Type 2 diabetes mellitus with diabetic autonomic (poly)neuropathy: Secondary | ICD-10-CM | POA: Insufficient documentation

## 2023-02-24 DIAGNOSIS — Z7901 Long term (current) use of anticoagulants: Secondary | ICD-10-CM | POA: Insufficient documentation

## 2023-02-24 DIAGNOSIS — M5030 Other cervical disc degeneration, unspecified cervical region: Secondary | ICD-10-CM | POA: Diagnosis not present

## 2023-02-24 DIAGNOSIS — C3412 Malignant neoplasm of upper lobe, left bronchus or lung: Secondary | ICD-10-CM

## 2023-02-24 DIAGNOSIS — G8929 Other chronic pain: Secondary | ICD-10-CM | POA: Insufficient documentation

## 2023-02-24 DIAGNOSIS — Z7951 Long term (current) use of inhaled steroids: Secondary | ICD-10-CM | POA: Insufficient documentation

## 2023-02-24 DIAGNOSIS — I509 Heart failure, unspecified: Secondary | ICD-10-CM | POA: Diagnosis not present

## 2023-02-24 DIAGNOSIS — I4891 Unspecified atrial fibrillation: Secondary | ICD-10-CM | POA: Diagnosis not present

## 2023-02-24 DIAGNOSIS — Z86718 Personal history of other venous thrombosis and embolism: Secondary | ICD-10-CM | POA: Insufficient documentation

## 2023-02-24 DIAGNOSIS — I700 Atherosclerosis of aorta: Secondary | ICD-10-CM | POA: Diagnosis not present

## 2023-02-24 DIAGNOSIS — Z87891 Personal history of nicotine dependence: Secondary | ICD-10-CM | POA: Insufficient documentation

## 2023-02-24 DIAGNOSIS — M25551 Pain in right hip: Secondary | ICD-10-CM

## 2023-02-24 DIAGNOSIS — K219 Gastro-esophageal reflux disease without esophagitis: Secondary | ICD-10-CM | POA: Insufficient documentation

## 2023-02-24 DIAGNOSIS — K862 Cyst of pancreas: Secondary | ICD-10-CM | POA: Insufficient documentation

## 2023-02-24 DIAGNOSIS — M797 Fibromyalgia: Secondary | ICD-10-CM | POA: Diagnosis not present

## 2023-02-24 DIAGNOSIS — Z7952 Long term (current) use of systemic steroids: Secondary | ICD-10-CM | POA: Insufficient documentation

## 2023-02-24 DIAGNOSIS — Z5181 Encounter for therapeutic drug level monitoring: Secondary | ICD-10-CM

## 2023-02-24 NOTE — Telephone Encounter (Signed)
Mr. Janyth Contes called on behalf of patient to have [facility] appointments moved to Fridays due to dialysis every Tu, Th, and Sat. Patient was heard in the background saying she is unwilling to come to [facility] after her dialysis appointments and was to only come on days in-between. Send message to scheduling to request change of all [facility] appointments to be moved to Friday's or otherwise.

## 2023-02-24 NOTE — Telephone Encounter (Signed)
 Tina Watkins

## 2023-02-24 NOTE — Progress Notes (Signed)
I received a call from Sabino Gasser at [facility] cytology department who is going to request the slides from [facility] from the patients lung biopsy to compare to the pancreatic biopsy done on 1/8 to determine if the pancreatic lesion is metastatic lung cancer or primary pancreatic cancer. Roe Coombs states that he will let me know when the results are back.

## 2023-02-25 ENCOUNTER — Other Ambulatory Visit: Payer: Self-pay | Admitting: Radiation Therapy

## 2023-02-25 ENCOUNTER — Inpatient Hospital Stay: Payer: Medicare Other

## 2023-02-25 ENCOUNTER — Telehealth: Payer: Self-pay | Admitting: Radiation Therapy

## 2023-02-25 ENCOUNTER — Inpatient Hospital Stay: Payer: Medicare Other | Admitting: Oncology

## 2023-02-25 DIAGNOSIS — C7931 Secondary malignant neoplasm of brain: Secondary | ICD-10-CM

## 2023-02-25 NOTE — Telephone Encounter (Signed)
I called to introduce myself and offer my contact information. We discussed her upcoming MRI scheduled on 1/27 and that we are planning to do her simulation on Wed 1/29, but will have to call her back about the arrival time for that appointment since I am not sure if IV contrast will be used. She was thankful for the call and has my info for future questions or concerns.   Jalene Mullet R.T.(R)(T) Radiation Special Procedure Lead

## 2023-02-26 ENCOUNTER — Inpatient Hospital Stay: Payer: Medicare Other

## 2023-02-26 ENCOUNTER — Telehealth: Payer: Self-pay

## 2023-02-26 ENCOUNTER — Encounter: Payer: Self-pay | Admitting: Oncology

## 2023-02-26 ENCOUNTER — Encounter: Payer: Self-pay | Admitting: Medical Oncology

## 2023-02-26 ENCOUNTER — Inpatient Hospital Stay (HOSPITAL_BASED_OUTPATIENT_CLINIC_OR_DEPARTMENT_OTHER): Payer: Medicare Other | Admitting: Oncology

## 2023-02-26 VITALS — BP 105/65 | HR 75 | Temp 97.6°F | Resp 18 | Ht 59.0 in | Wt 107.0 lb

## 2023-02-26 DIAGNOSIS — C3412 Malignant neoplasm of upper lobe, left bronchus or lung: Secondary | ICD-10-CM | POA: Diagnosis not present

## 2023-02-26 DIAGNOSIS — R109 Unspecified abdominal pain: Secondary | ICD-10-CM | POA: Diagnosis not present

## 2023-02-26 DIAGNOSIS — D631 Anemia in chronic kidney disease: Secondary | ICD-10-CM

## 2023-02-26 DIAGNOSIS — N186 End stage renal disease: Secondary | ICD-10-CM

## 2023-02-26 DIAGNOSIS — Z992 Dependence on renal dialysis: Secondary | ICD-10-CM

## 2023-02-26 DIAGNOSIS — R0902 Hypoxemia: Secondary | ICD-10-CM

## 2023-02-26 DIAGNOSIS — K869 Disease of pancreas, unspecified: Secondary | ICD-10-CM | POA: Diagnosis not present

## 2023-02-26 DIAGNOSIS — E876 Hypokalemia: Secondary | ICD-10-CM | POA: Diagnosis not present

## 2023-02-26 DIAGNOSIS — Z452 Encounter for adjustment and management of vascular access device: Secondary | ICD-10-CM

## 2023-02-26 LAB — COMPREHENSIVE METABOLIC PANEL
ALT: 10 U/L (ref 0–44)
AST: 17 U/L (ref 15–41)
Albumin: 3.9 g/dL (ref 3.5–5.0)
Alkaline Phosphatase: 213 U/L — ABNORMAL HIGH (ref 38–126)
Anion gap: 14 (ref 5–15)
BUN: 24 mg/dL — ABNORMAL HIGH (ref 6–20)
CO2: 28 mmol/L (ref 22–32)
Calcium: 10.3 mg/dL (ref 8.9–10.3)
Chloride: 90 mmol/L — ABNORMAL LOW (ref 98–111)
Creatinine, Ser: 4.81 mg/dL — ABNORMAL HIGH (ref 0.44–1.00)
GFR, Estimated: 10 mL/min — ABNORMAL LOW (ref >60)
Glucose, Bld: 97 mg/dL (ref 70–99)
Potassium: 3.5 mmol/L (ref 3.5–5.1)
Sodium: 132 mmol/L — ABNORMAL LOW (ref 135–145)
Total Bilirubin: 0.4 mg/dL (ref 0.0–1.2)
Total Protein: 6.6 g/dL (ref 6.5–8.1)

## 2023-02-26 LAB — CBC WITH DIFFERENTIAL/PLATELET
Abs Immature Granulocytes: 3.02 10*3/uL — ABNORMAL HIGH (ref 0.00–0.07)
Basophils Absolute: 0.1 10*3/uL (ref 0.0–0.1)
Basophils Relative: 1 %
Eosinophils Absolute: 0 10*3/uL (ref 0.0–0.5)
Eosinophils Relative: 0 %
HCT: 24.7 % — ABNORMAL LOW (ref 36.0–46.0)
Hemoglobin: 8 g/dL — ABNORMAL LOW (ref 12.0–15.0)
Immature Granulocytes: 13 %
Lymphocytes Relative: 8 %
Lymphs Abs: 1.9 10*3/uL (ref 0.7–4.0)
MCH: 28.6 pg (ref 26.0–34.0)
MCHC: 32.4 g/dL (ref 30.0–36.0)
MCV: 88.2 fL (ref 80.0–100.0)
Monocytes Absolute: 2.2 10*3/uL — ABNORMAL HIGH (ref 0.1–1.0)
Monocytes Relative: 9 %
Neutro Abs: 16.6 10*3/uL — ABNORMAL HIGH (ref 1.7–7.7)
Neutrophils Relative %: 69 %
Platelets: 100 10*3/uL — ABNORMAL LOW (ref 150–400)
RBC: 2.8 MIL/uL — ABNORMAL LOW (ref 3.87–5.11)
RDW: 17.6 % — ABNORMAL HIGH (ref 11.5–15.5)
WBC: 23.8 10*3/uL — ABNORMAL HIGH (ref 4.0–10.5)
nRBC: 0.2 % (ref 0.0–0.2)

## 2023-02-26 LAB — MAGNESIUM: Magnesium: 1.8 mg/dL (ref 1.7–2.4)

## 2023-02-26 MED ORDER — HEPARIN SOD (PORK) LOCK FLUSH 100 UNIT/ML IV SOLN
500.0000 [IU] | Freq: Once | INTRAVENOUS | Status: AC
  Administered 2023-02-26: 500 [IU]

## 2023-02-26 MED ORDER — SODIUM CHLORIDE 0.9% FLUSH
10.0000 mL | Freq: Once | INTRAVENOUS | Status: AC
  Administered 2023-02-26: 10 mL

## 2023-02-26 NOTE — Assessment & Plan Note (Signed)
Possibly related to kidney disease. Her mircera is on hold due to the malignancy.   -Order iron and other relevant tests to determine if replacement is needed.  -She was given 1 unit of PRBCs on 02/12/2023.  Hemoglobin is stable at 8 today.  Since she is on chemotherapy, goal is to maintain hemoglobin closer to 8.  No indication for transfusion currently.

## 2023-02-26 NOTE — Progress Notes (Deleted)
SATURATION QUALIFICATIONS: (This note is used to comply with regulatory documentation for home oxygen)  Patient Saturations on Room Air at Rest = 90%  Patient Saturations on Room Air while Ambulating = 88%  Patient Saturations on 2 Liters of oxygen while Ambulating = 90%  Please briefly explain why patient needs home oxygen:

## 2023-02-26 NOTE — Telephone Encounter (Signed)
Spoke with staff person Barnabas Harries to set up 02 home delivery for patient. Lurena Joiner was also sent message through In Basket. All set for 02 delivery for this evening per Lurena Joiner.

## 2023-02-26 NOTE — Assessment & Plan Note (Signed)
Intermittent hypoxia with O2 sats in the high 80s to low 90s.  Will obtain 6-minute walk test today and determine whether she would be a candidate for home oxygen.

## 2023-02-26 NOTE — Progress Notes (Signed)
[facility]  ONCOLOGY CLINIC PROGRESS NOTE   Patient Care Team: Tina Andrew, NP as PCP - General (Pulmonary Disease) O'Neal, Ronnald Ramp, MD as PCP - Cardiology (Cardiology) Bensimhon, Bevelyn Buckles, MD as PCP - Advanced Heart Failure (Cardiology) Tina Butte, RN as Oncology Nurse Navigator  PATIENT NAME: Tina Watkins   MR#: [id number] DOB: [birth year]  Date of visit: 02/26/2023   ASSESSMENT & PLAN:   Tina Watkins is a 58 y.o. lady with multiple medical comorbidities including kidney transplant, currently ESRD on HD, congestive heart failure, atrial fibrillation/flutter, COPD, depression, chronic gout, chronic pain, anemia of chronic disease, gastroparesis, midodrine dependent hypotension, history of strokes, suspected mesenteric ischemia, was recently diagnosed with malignant lung nodule on 11/16/2022 via bronchoscopy.  Immunostains suggestive of adenocarcinoma.   Lung cancer, upper lobe (HCC) -Please review oncology history for additional details and timeline of events.  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  -Her case was discussed in tumor conference on 01/07/2023.  Consensus opinion is that these lung nodules are malignancy related.  Hence clinically it appears to be stage IVa disease.  All treatment options are palliative in nature and not curative in intent and this was explained to patient and her fianc and they verbalized understanding.  -Previously we submitted request for [facility] One mutation testing on the specimen.  It showed no actionable mutations.  -Plan for chemotherapy with immunotherapy or immunotherapy alone.  Since Alimta could not be used because she is on hemodialysis, will plan for carboplatin, paclitaxel, pembrolizumab for 4 cycles followed by maintenance treatments with pembrolizumab alone  every 3 weeks.  We will dose reduce carboplatin to AUC 4 and paclitaxel to 150 mg/m, given dialysis status.  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  She could not even receive PICC line placement and ended up getting midline placed in the left arm.  IR suggested that we should check with nephrology to see if we can use her dialysis catheter for chemotherapy.  Tina [facility], her nephrologist approved use of dialysis catheter for chemo, in case it is needed.  However she does not have any other access left and if femoral dialysis catheter were to fail, then she has no other option for dialysis other than possibly a transhepatic catheter and if that fails, then palliative care and death.  -We were able to get peripheral IV inserted using the help of for IV team and proceed with cycle 1 of carboplatin and paclitaxel on 02/17/2023.  She received Keytruda first dose on 02/12/2023.  Today she presented for toxicity evaluation.  Tolerated cycle 1 overall well with expected side effects of decreased appetite and altered taste.  Labs today reveal no dose-limiting toxicities.  -Pancreatic lesion biopsy at [facility] on 02/10/2023 came back positive for adenocarcinoma.  Unsure if it is primary pancreatic or metastatic from lung.  Immunostains were not obtained and we have submitted request for this.  MRI of the brain from 01/29/2023 showed 1.2 cm enhancing mass in the left insula, consistent with metastatic disease.  Started her on Decadron 4 mg p.o. twice daily and referral sent to radiation oncology for further evaluation and management.  She was evaluated by Tina [facility].  Plan for SRS in a single session on 03/10/2023.  Recent CT of the chest incidentally also showed liver lesions which are new and  concerning for metastatic disease.    On 02/22/2023, CT abdomen and pelvis showed similar findings but indeterminate.  Metastatic disease could not be ruled out.  Following results of pancreatic  biopsy immunostains, we will consider liver biopsy to determine etiology of liver lesions-lung versus pancreatic primary.  Cycle 2 is tentatively scheduled for 03/12/2023.   Lesion of pancreas Heterogeneous pancreatic cystic lesion was not FDG avid on recent PET scan.  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   -Case reviewed by Tina [facility] who recommended workup at a tertiary [facility].  She had biopsy of this on 02/10/2023 at [facility].  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  However immunostains were not obtained.  Previously we requested immunostains to be submitted to determine whether or if it is pancreatic primary versus metastatic disease from lung.  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  Further plan of care to be determined depending on these results.  -Her CA 19-9 was undetectable but CEA was elevated at 230 on 12/30/2022.  This will be monitored on future visits.  Anemia in chronic kidney disease Possibly related to kidney disease. Her mircera is on hold due to the malignancy.   -Order iron and other relevant tests to determine if replacement is needed.  -She was given 1 unit of PRBCs on 02/12/2023.  Hemoglobin is stable at 8 today.  Since she is on chemotherapy, goal is to maintain hemoglobin closer to 8.  No indication for transfusion currently.  Hypoxia Intermittent hypoxia with O2 sats in the high 80s to low 90s.  Will obtain 6-minute walk test today and determine whether she would be a candidate for home oxygen.   I reviewed lab results and outside records for this visit and discussed relevant results with the patient. Diagnosis, plan of care and treatment options were also discussed in detail with the patient. Opportunity provided to ask questions and answers provided to her apparent satisfaction. Provided instructions to call our clinic with any problems, questions or  concerns prior to return visit. I recommended to continue follow-up with PCP and sub-specialists. She verbalized understanding and agreed with the plan.   NCCN guidelines have been consulted in the planning of this patient's care.  I spent a total of 30 minutes during this encounter with the patient including review of chart and various tests results, discussions about plan of care and coordination of care plan.   Meryl Crutch, MD   [place] [facility] - A DEPT OF [facility] [address] [place] [address] Dept: [phone] Dept Fax: [phone]    CHIEF COMPLAINT/ REASON FOR VISIT:   Lung adenocarcinoma with multiple bilateral lung nodules, 1 intracranial metastasis.  Stage IV disease.   Current Treatment: Plan made for palliative systemic chemoimmunotherapy with carboplatin, paclitaxel and pembrolizumab.  Pemetrexed could not be used because of hemodialysis status.  Started treatments from 02/12/2023 using peripheral IV.  She cannot get PICC line or Port-A-Cath placement because of bad vasculopathy.  INTERVAL HISTORY:    Discussed the use of AI scribe software for clinical note transcription with the patient, who gave verbal consent to proceed.   [name] is here today for repeat clinical assessment.   She reports experiencing side effects from the treatment, including loss of taste and decreased appetite. However, she notes that her appetite is slowly improving. The patient also reports experiencing shortness  of breath and has been requesting oxygen. She notes that her oxygen saturation levels have been fluctuating, with readings in the eighties and up to ninety.  The patient also reports having diarrhea for about a week after starting chemotherapy, which has since resolved with the use of Imodium. She notes that the diarrhea was severe, requiring the use of adult diapers, but it has now stabilized.  I  have reviewed the past medical history, past surgical history, social history and family history with the patient and they are unchanged from previous note.  HISTORY OF PRESENT ILLNESS:   ONCOLOGY HISTORY:    Patient was seen by pulmonology after she was diagnosed with COVID-19 infection in the ED on 09/14/2022.  Because of her chronic smoking history with at least 51-pack-year smoking, a low-dose CT of the chest was obtained for lung cancer screening.  On 09/28/2022, CT chest showed dominant new solid peripheral left upper lobe lung nodule measuring 21 mm.  Several new clustered solid pulmonary nodules throughout left upper lobe, largest measuring 5 mm.  Numerous additional tiny 2 to 3 mm solid pulmonary nodules scattered in both lungs, new finding.  Findings concerning for primary bronchogenic malignancy.  Lung RADS 4B, suspicious.  Also noted was cystic pancreatic tail lesions, largest measuring 2.6 cm posteriorly, indeterminate.  MRI abdomen recommended for further evaluation.   Because of CT chest findings, PET/CT was obtained on 10/19/2022 and it showed 2.6 cm subpleural nodule in the left upper lobe with SUV max of 4.9.  Primary bronchogenic neoplasm could not be excluded.  Tissue sampling was recommended.  Also noted was 15 mm short axis prevascular lymph node with SUV of 2.4, new compared to 2023.  Indeterminate.  Small left pleural effusion was also noted.   Several subcentimeter lung nodules were noted on CT scan . They are below the threshold by size, for determining FDG activity and hence were not apparent on PET scan.    MRI of the abdomen on 11/11/2022: Evaluation of the pancreatic tail was limited by metallic susceptibility artifact from adjacent surgical clips as well as iron burden in the adjacent stomach parenchyma.  There was a complex, multiseptated, internally heterogeneous cystic lesion arising from the tip of the pancreatic tail measuring 4.4 x 2.3 x 3.2 cm.  Contrast enhancement  could not be confirmed due to technical limitations.  Appearance was concerning for a partially cystic malignancy with a significant solid component.  Tissue sampling was recommended.  For future, recommended multiphasic contrast-enhanced CT.  Severe hepatic and splenic iron deposition was noted.  Coarse, nodular contour of the liver, consistent with cirrhosis.  Status post bilateral native nephrectomy.  Right lower quadrant renal transplant graft was noted.  Diffusely heterogeneous bone marrow, reflecting marrow reconversion renal osteodystrophy.   On 11/11/2022, she had multidetector CT imaging of the chest using thin slice collimation for electromagnetic bronchoscopy planning purposes.  It showed similar findings compared to prior imaging.   On 11/16/2022, Dr. Delton Coombes performed robotic assisted navigational bronchoscopy and biopsies.  Left upper lobe lung nodule needle aspiration biopsy showed presence of malignant cells.  Immunostains were obtained at a later date and showed positivity for TTF-1 and negative for p40 and cytokeratin 5/6, consistent with lung adenocarcinoma.  Endobronchial brushings and brushings from left upper lobe lung nodule showed no malignant cells.   She was admitted to the hospital on 12/18/2022 after she presented with shortness of breath, cough with yellow sputum, chills, myalgias and diarrhea.  She was noted to be  in acute hypoxic respiratory failure, related to postobstructive pneumonia.  CT angiogram of the chest on 12/18/2022 showed no evidence of pulmonary embolism.  It did show numerous small nodules throughout right lung which appeared to be increased in number compared to prior study.  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  Stable mediastinal lymphadenopathy.   Underwent left-sided thoracentesis on 12/20/2022.  Pleural fluid cytology showed no  evidence of malignant cells.   NGS testing showed no actionable mutations.  Plan made for palliative chemoimmunotherapy with carboplatin, paclitaxel and pembrolizumab.    Port-A-Cath could not be placed because of bad vasculopathy.  She could not even receive PICC line placement and ended up getting midline placed in the left arm.  Presented to clinic on 02/12/2023 for initiation of treatments.  Her hemoglobin was 7 and she had to be given 1 unit of PRBC transfusion.  We proceeded with pembrolizumab that day.    Started carboplatin and paclitaxel at a later date on 02/17/2023.  Chemotherapy is being given through peripheral IV using help of vascular access team.  -Pancreatic lesion biopsy at [facility] on 02/10/2023 came back positive for adenocarcinoma.  Unsure if it is primary pancreatic or metastatic from lung.  Immunostains were not obtained and we have submitted request for this.  MRI of the brain from 01/29/2023 showed 1.2 cm enhancing mass in the left insula, consistent with metastatic disease.  Started her on Decadron 4 mg p.o. twice daily and referral sent to radiation oncology for further evaluation and management.  She was evaluated by Tina [facility].  Plan for SRS in a single session on 03/10/2023.  Recent CT of the chest incidentally also showed liver lesions which are new and concerning for metastatic disease.    On 02/22/2023, CT abdomen and pelvis showed similar findings but indeterminate.  Metastatic disease could not be ruled out.  Following results of pancreatic biopsy immunostains, we will consider liver biopsy to determine etiology of liver lesions-lung versus pancreatic primary.   Oncology History  Lung cancer, upper lobe (HCC)  10/28/2022 Initial Diagnosis   Lung cancer, upper lobe (HCC)   01/11/2023 Cancer Staging   Staging form: Lung, AJCC 8th Edition - Clinical: Stage IVA (cTX, cNX, cM1a) - Signed by Meryl Crutch, MD on 01/11/2023   02/12/2023 -  Chemotherapy    Patient is on Treatment Plan : LUNG NSCLC Carboplatin (6) + Paclitaxel (200) + Pembrolizumab (200) D1 q21d x 4 cycles / Pembrolizumab (200) Maintenance D1 q21d         REVIEW OF SYSTEMS:   Review of Systems - Oncology  All other pertinent systems were reviewed with the patient and are negative.  ALLERGIES: She is allergic to levofloxacin and tape.  MEDICATIONS:  Current Outpatient Medications  Medication Sig Dispense Refill   allopurinol (ZYLOPRIM) 100 MG tablet Take 0.5 tablets (50 mg total) by mouth 2 (two) times a week. (Patient taking differently: Take 100 mg by mouth daily in the afternoon.) 90 tablet 3   amiodarone (PACERONE) 200 MG tablet Take 0.5 tablets (100 mg total) by mouth daily. 90 tablet 1   apixaban (ELIQUIS) 2.5 MG TABS tablet Take 1 tablet (2.5 mg total) by mouth 2 (two) times daily. Okay to restart this medication on 11/18/2022     Colchicine 0.6 MG CAPS Take 0.6 mg by mouth daily as needed (gout flare up). 30 capsule 2  cyclobenzaprine (FLEXERIL) 5 MG tablet Take 1 tablet (5 mg total) by mouth at bedtime as needed. 90 tablet 1   dexamethasone (DECADRON) 4 MG tablet Take 2 tablets (8mg ) by mouth daily starting the day after carboplatin for 3 days. Take with food 30 tablet 1   dexamethasone (DECADRON) 4 MG tablet Take 1 tablet (4 mg total) by mouth 2 (two) times daily with a meal. 60 tablet 0   diclofenac Sodium (VOLTAREN) 1 % GEL Apply 4 g topically as needed. 50 g 0   diphenhydrAMINE (BENADRYL) 25 mg capsule Take 1 capsule (25 mg total) by mouth every 8 (eight) hours as needed. 30 capsule 6   dorzolamide-timolol (COSOPT) 2-0.5 % ophthalmic solution Place 1 drop into the left eye 2 (two) times daily.     famotidine (PEPCID) 20 MG tablet TAKE 1 TABLET BY MOUTH TWICE A DAY 180 tablet 3   fluticasone-salmeterol (ADVAIR HFA) 115-21 MCG/ACT inhaler Inhale 2 puffs into the lungs 2 (two) times daily. 1 each 12   gabapentin (NEURONTIN) 100 MG capsule Take 100 mg by mouth 3  (three) times daily as needed (nerve pain).     heparin sodium, porcine, 1000 UNIT/ML injection Inject 1,000 Units into the peritoneum once.     iron sucrose (VENOFER) 20 MG/ML injection Inject 100 mg into the vein one time in dialysis.     lidocaine (LIDODERM) 5 % Place 1 patch onto the skin daily as needed (for pain- remove old patch first).      lidocaine-prilocaine (EMLA) cream Apply to affected area once 30 g 3   metoCLOPramide (REGLAN) 10 MG tablet Take 1 tablet (10 mg total) by mouth 3 (three) times daily before meals. (Patient taking differently: Take 10 mg by mouth daily in the afternoon.) 90 tablet 2   midodrine (PROAMATINE) 10 MG tablet TAKE 1.5 TABLETS (15 MG TOTAL) BY MOUTH 3 (THREE) TIMES DAILY. NEEDS FOLLOW UP APPOINTMENT FOR ANYMORE REFILLS 405 tablet 0   omeprazole (PRILOSEC) 40 MG capsule TAKE 1 CAPSULE (40 MG TOTAL) BY MOUTH DAILY. 90 capsule 1   ondansetron (ZOFRAN) 8 MG tablet Take 1 tablet (8 mg total) by mouth every 8 (eight) hours as needed for nausea or vomiting. Start on the third day after carboplatin. 30 tablet 1   oxyCODONE-acetaminophen (PERCOCET) 10-325 MG tablet Take 1 tablet by mouth 4 (four) times daily as needed for pain.     prochlorperazine (COMPAZINE) 10 MG tablet Take 1 tablet (10 mg total) by mouth every 6 (six) hours as needed for nausea or vomiting. 30 tablet 1   sertraline (ZOLOFT) 25 MG tablet Take 1 tablet (25 mg total) by mouth daily. 30 tablet 3   VELPHORO 500 MG chewable tablet Taking 500 mg by mouth per meal or snack     zolpidem (AMBIEN) 10 MG tablet Take 1 tablet (10 mg total) by mouth at bedtime as needed for sleep. 10 tablet 0   No current facility-administered medications for this visit.     VITALS:   Blood pressure 105/65, pulse 75, temperature 97.6 F (36.4 C), temperature source Temporal, resp. rate 18, height 4\' 11"  (1.499 m), weight 107 lb (48.5 kg), SpO2 90%.  Wt Readings from Last 3 Encounters:  02/26/23 107 lb (48.5 kg)  02/24/23  105 lb 9.6 oz (47.9 kg)  02/17/23 101 lb 0.6 oz (45.8 kg)    Body mass index is 21.61 kg/m.  Performance status (ECOG): 2 - Symptomatic, <50% confined to bed  PHYSICAL EXAM:  Physical Exam Constitutional:      General: She is not in acute distress. HENT:     Head: Normocephalic and atraumatic.  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     Heart sounds: Normal heart sounds.  Pulmonary:     Effort: Pulmonary effort is normal.     Comments: Dullness on left side up to infrascapular area Abdominal:     General: There is no distension.  Musculoskeletal:     Right lower leg: No edema.     Left lower leg: No edema.     Comments: Left arm midline in place  Neurological:     Mental Status: She is alert and oriented to person, place, and time.  Psychiatric:        Mood and Affect: Mood normal.        Behavior: Behavior normal.        Thought Content: Thought content normal.       LABORATORY DATA:   I have reviewed the data as listed.  Results for orders placed or performed in visit on 02/26/23  Magnesium  Result Value Ref Range   Magnesium 1.8 1.7 - 2.4 mg/dL  Comprehensive metabolic panel  Result Value Ref Range   Sodium 132 (L) 135 - 145 mmol/L   Potassium 3.5 3.5 - 5.1 mmol/L   Chloride 90 (L) 98 - 111 mmol/L   CO2 28 22 - 32 mmol/L   Glucose, Bld 97 70 - 99 mg/dL   BUN 24 (H) 6 - 20 mg/dL   Creatinine, Ser 1.61 (H) 0.44 - 1.00 mg/dL   Calcium 09.6 8.9 - 04.5 mg/dL   Total Protein 6.6 6.5 - 8.1 g/dL   Albumin 3.9 3.5 - 5.0 g/dL   AST 17 15 - 41 U/L   ALT 10 0 - 44 U/L   Alkaline Phosphatase 213 (H) 38 - 126 U/L   Total Bilirubin 0.4 0.0 - 1.2 mg/dL   GFR, Estimated 10 (L) >60 mL/min   Anion gap 14 5 - 15  CBC with Differential/Platelet  Result Value Ref Range   WBC 23.8 (H) 4.0 - 10.5 K/uL   RBC 2.80 (L) 3.87 - 5.11 MIL/uL   Hemoglobin 8.0 (L) 12.0 - 15.0 g/dL   HCT 40.9 (L) 81.1 -  46.0 %   MCV 88.2 80.0 - 100.0 fL   MCH 28.6 26.0 - 34.0 pg   MCHC 32.4 30.0 - 36.0 g/dL   RDW 91.4 (H) 78.2 - 95.6 %   Platelets 100 (L) 150 - 400 K/uL   nRBC 0.2 0.0 - 0.2 %   Neutrophils Relative % 69 %   Neutro Abs 16.6 (H) 1.7 - 7.7 K/uL   Lymphocytes Relative 8 %   Lymphs Abs 1.9 0.7 - 4.0 K/uL   Monocytes Relative 9 %   Monocytes Absolute 2.2 (H) 0.1 - 1.0 K/uL   Eosinophils Relative 0 %   Eosinophils Absolute 0.0 0.0 - 0.5 K/uL   Basophils Relative 1 %   Basophils Absolute 0.1 0.0 - 0.1 K/uL   WBC Morphology DOHLE BODIES    Smear Review LARGE PLTS    Immature Granulocytes 13 %   Abs Immature Granulocytes 3.02 (H) 0.00 - 0.07 K/uL   Ovalocytes PRESENT      RADIOGRAPHIC STUDIES:  I have personally reviewed the radiological images as listed and agree with the findings in the  report.  CT CHEST ABDOMEN PELVIS W CONTRAST Result Date: 02/22/2023 CLINICAL DATA:  Hepatocellular carcinoma, assessment of treatment response, status post chemotherapy. Lung cancer diagnosed in 2024. Dialysis 3 times a week. Prior nephrectomy. * Tracking Code: BO * EXAM: CT CHEST, ABDOMEN, AND PELVIS WITH CONTRAST TECHNIQUE: Multidetector CT imaging of the chest, abdomen and pelvis was performed following the standard protocol during bolus administration of intravenous contrast. RADIATION DOSE REDUCTION: This exam was performed according to the departmental dose-optimization program which includes automated exposure control, adjustment of the mA and/or kV according to patient size and/or use of iterative reconstruction technique. CONTRAST:  75mL OMNIPAQUE IOHEXOL 300 MG/ML  SOLN COMPARISON:  CTA chest from 02/03/2023. MRI abdomen 11/11/2022. PET-CT 10/19/2022. FINDINGS: CT CHEST FINDINGS Cardiovascular: Reduced caliber left pulmonary artery likely attributable to hypoventilation induced raise a constriction. SVC stent noted. Chronic calcific clot in the left subclavian vein and brachiocephalic vein, with near  occluded brachiocephalic vein and extensive venous collaterals through the azygous system, right internal mammary vein, and elsewhere. Coronary, aortic arch, and branch vessel atherosclerotic vascular disease. Mediastinum/Nodes: Right paratracheal node/mass 2.9 cm in short axis, previously 2.8 cm. Subcarinal adenopathy 2.0 cm in short axis, formerly the same. Lungs/Pleura: Moderate to large left pleural effusion. Consolidated left upper lobe with fiducial adjacent to a 2.8 by 1.9 cm left upper lobe hypodense lesion on image 21 series 3. The vast majority of the left lower lobe is atelectatic. Innumerable pulmonary nodules throughout the right lung are observed, generally under 1 cm in diameter. These are similar to previous. Ground-glass opacities in the right lung and interstitial accentuation in the right lung is improved compared to 02/03/2023, and the right pleural effusion has resolved. An index right upper lobe nodule measures 5 by 6 mm on image 12 series 5, stable. Musculoskeletal: Mild sclerosis, query renal osteodystrophy. CT ABDOMEN PELVIS FINDINGS Hepatobiliary: Scattered hypodense hepatic lesions are observed, not present on 11/11/2022 and similar to. Index right hepatic lobe lesion adjacent to the intrahepatic portion of the IVC measures 1.5 by 1.4 cm on image 45 series 3 (stable from 02/03/2023). There are about 15 lesions. Borderline distended gallbladder. No biliary dilatation. Patent portal vein. Hepatic cirrhosis noted. Pancreas: Mass of the pancreatic tail with cystic elements and possible solid elements, measuring 5.4 by 2.9 by 3.9 cm. Roughly similar appearance to the MRI from 11/11/2022, this could be a postinflammatory lesion, serous cystadenoma, or intraductal papillary mucinous neoplasm. Metastatic disease with cystic elements not excluded. Spleen: Upper normal splenic size. Adrenals/Urinary Tract: Right adrenal gland normal. Left adrenal gland poorly seen. Bilateral native nephrectomies  with a atrophic transplant kidney along the right pelvis. Urinary bladder is relatively empty. Stomach/Bowel: Air-level in the sigmoid colon suggesting diarrheal process. No substantial bowel wall thickening or dilated bowel. Vascular/Lymphatic: Atherosclerosis is present, including aortoiliac atherosclerotic disease. Right femoral dialysis catheter terminates in the IVC chronic dense calcification along the anterior margin of the right external iliac artery measuring 2.9 by 1.5 cm on image 81 series 3, not certain whether this is a chronically thrombosed saccular aneurysm or new intra-lesion along the right adnexa, versus a chronic calcified lymph node. This was not hypermetabolic on PET-CT of 10/19/2022. Perirectal venous collateral vessels noted Reproductive: Calcified lesion is noted in the vascular section above in the vicinity of the right adnexa. Otherwise unremarkable. Other: Mild subcutaneous edema noted.  No current ascites. Musculoskeletal: Faint sclerosis possibly from renal osteodystrophy. 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 No collapse or fracture. IMPRESSION: 1. Moderate to  large left pleural effusion with near complete atelectasis of the left lower lobe. 2. Completely consolidated left upper lobe with fiducial adjacent to a 2.8 by 1.9 cm left upper lobe hypodense lesion. 3. Innumerable pulmonary nodules throughout the right lung, generally under 1 cm in diameter, similar to previous. 4. Mediastinal adenopathy, stable. 5. Scattered hypodense hepatic lesions, not present on 11/11/2022 and stable from 02/03/2023, not entirely specific but favoring metastatic disease. 6. Mass of the pancreatic tail with cystic elements and possible solid elements, roughly similar to the MRI from 11/11/2022, this could be a postinflammatory lesion, serous cystadenoma, metastatic disease, or intraductal papillary mucinous neoplasm. 7. 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 SVC stent noted. 8. Chronic avascular necrosis of the right femoral head. No collapse or fracture. 9. Hepatic cirrhosis. 10. Air-level in the sigmoid colon suggesting diarrheal process. 11. Chronic dense calcification along the anterior margin of the right external iliac artery measuring 2.9 by 1.5 cm, not certain whether this is a chronically thrombosed saccular aneurysm or new intra-lesion along the right adnexa, versus a chronic calcified lymph node. This was not hypermetabolic on PET-CT of 10/19/2022. 12. Aortic and extensive systemic atherosclerosis. Aortic Atherosclerosis (ICD10-I70.0). Electronically Signed   By: Gaylyn Rong M.D.   On: 02/22/2023 20:18   MR Brain W Wo Contrast Result Date: 02/12/2023 CLINICAL DATA:  Non-small cell lung cancer, staging EXAM: MRI HEAD WITHOUT AND WITH CONTRAST TECHNIQUE: Multiplanar, multiecho pulse sequences of the brain and surrounding structures were obtained without and with intravenous contrast. CONTRAST:  5mL GADAVIST GADOBUTROL 1 MMOL/ML IV SOLN COMPARISON:  01/18/2003 MRI head FINDINGS: Brain: Enhancing mass in the left insula, which measures up to 1.2 x 0.9 x 1.1 cm (AP x TR x CC) (series 19, image 74 and series 17, image 19). No other abnormal parenchymal or meningeal enhancement. No evidence of acute or subacute infarct. No acute hemorrhage, mass effect, or midline shift. No hydrocephalus or extra-axial collection. Pituitary and craniocervical junction within normal limits. No hemosiderin deposition to suggest remote hemorrhage. Mildly advanced cerebral volume loss for age. 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 Remote lacunar infarct in the left basal ganglia. T2 hyperintense signal in the periventricular white matter, likely the sequela of mild-to-moderate chronic small  vessel ischemic disease. Vascular: Normal arterial flow voids. Normal arterial and venous enhancement. Skull and upper cervical spine: Normal marrow signal. Sinuses/Orbits: Clear paranasal sinuses. No acute finding in the orbits. Other: Trace fluid in the left mastoid air cells. IMPRESSION: Enhancing mass in the left insula, measuring up to 1.2 cm, consistent with metastatic disease. No other evidence of intracranial metastatic disease. These results will be called to the ordering clinician or representative by the Radiologist Assistant, and communication documented in the PACS or [employer]. Electronically Signed   By: Wiliam Ke M.D.   On: 02/12/2023 13:15   IR PICC PLACEMENT LEFT >5 YRS INC IMG GUIDE Result Date: 02/08/2023 INDICATION: PICC PLACEMENT; New lung cancer dx. Needs chemo Briefly, 58 year old female with a history of ESRD on HD via a RIGHT femoral catheter. No central stenoses with SVC stent and significant RIGHT chest wall collaterals. Patient previously recommended for port catheter, however no viable central venous access. Midline catheter recommended after provider discussion. EXAM: Procedures: 1. LEFT UPPER EXTREMITY VENOGRAM 2. ULTRASOUND AND FLUOROSCOPIC GUIDED PICC LINE INSERTION MEDICATIONS: None  CONTRAST:  10 mL Omnipaque 300 intravenous contrast ANESTHESIA/SEDATION: Local anesthetic was administered. The patient was continuously monitored during the procedure by the interventional radiology nurse under my direct supervision. FLUOROSCOPY TIME:  Fluoroscopic dose; 3 mGy COMPLICATIONS: None immediate. TECHNIQUE: The procedure, risks, benefits, and alternatives were explained to the patient and/or patient's representative and informed written consent was obtained. A timeout was performed prior to the initiation of the procedure. The LEFT upper extremity was prepped with chlorhexidine in a sterile fashion, and a sterile drape was applied covering the operative field. Maximum barrier  sterile technique with sterile gowns and gloves were used for the procedure. A timeout was performed prior to the initiation of the procedure. Local anesthesia was provided with 1% lidocaine. Under direct ultrasound guidance, the LEFT basilic vein was accessed with a micropuncture kit after the overlying soft tissues were anesthetized with 1% lidocaine. An ultrasound image was saved for documentation purposes. A LEFT upper extremity venogram was then performed, demonstrating patent basilic vein to the level of the thoracic inlet. A guidewire was advanced to the level of the superior caval-atrial junction for measurement purposes and the central catheter line was cut to length. A peel-away sheath was placed and a 21 cm, 5 Fr, single lumen was inserted to level of the axillary vein. A post procedure spot fluoroscopic was obtained. The catheter easily aspirated and flushed and was sutured in place. A dressing was placed. The patient tolerated the procedure well without immediate post procedural complication. FINDINGS: *LEFT upper extremity venogram with central venous obstruction at the level of the thoracic inlet. Occluded LEFT innominate vein, with collateral venous drainage into the RIGHT atrium via the hemi-azygous veins. *After catheter placement, the midline catheter tip lies within the axillary vein the catheter aspirates and flushes normally and is ready for immediate use. IMPRESSION: 1. 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 2. Successful ultrasound and fluoroscopic guided placement of a LEFT basilic vein-approach, 21 cm, 5 Fr single lumen midline catheter with tip at the axillary vein. The central line is ready for immediate use. Roanna Banning, MD [facility] Electronically Signed   By: Roanna Banning M.D.   On: 02/08/2023 17:59   CT Angio Chest PE W and/or Wo Contrast Result Date: 02/03/2023 CLINICAL DATA:  Shortness of  breath. History of lung cancer. * Tracking Code: BO * EXAM: CT ANGIOGRAPHY CHEST WITH CONTRAST TECHNIQUE: Multidetector CT imaging of the chest was performed using the standard protocol during bolus administration of intravenous contrast. Multiplanar CT image reconstructions and MIPs were obtained to evaluate the vascular anatomy. RADIATION DOSE REDUCTION: This exam was performed according to the departmental dose-optimization program which includes automated exposure control, adjustment of the mA and/or kV according to patient size and/or use of iterative reconstruction technique. CONTRAST:  75mL OMNIPAQUE IOHEXOL 350 MG/ML SOLN COMPARISON:  12/18/2022 FINDINGS: Cardiovascular: Satisfactory opacification of the pulmonary arteries to the segmental level. No evidence of pulmonary embolism. Web-like filling defect within the distal right main pulmonary artery is unchanged from previous exam and likely represents sequelae of chronic PE. Heart size appears normal. The main pulmonary artery measures 3.8 cm in diameter. Aortic atherosclerosis and coronary artery calcifications. Mediastinum/Nodes: Diffuse decreased AP diameter of the trachea and lower airways concerning for chronic tracheobronchomalacia. 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 There is a stent noted within the distal SVC. Retrograde opacification of the SVC  via a dilated azygous vein is identified, image 146/7. Right paratracheal and subcarinal adenopathy identified. Right paratracheal nodal mass measures 2.6 cm, image 118/7. Previously 1.3 cm. Subcarinal lymph node measures 1.8 cm, image 173/7. Unchanged from previous exam. New right hilar adenopathy measures 1.2 cm, image 174/7. Progressive left-sided pre-vascular adenopathy is also identified. Index lymph node measures 1.8 cm, image 143/7. Formally 1.5 cm. Lungs/Pleura: There is complete occlusion of the left upper  lobe airway with chronic consolidative change throughout the left upper lobe. Underlying neoplastic process is suspected. Moderate left effusion is increased from previous exam. There is been progressive atelectasis and volume loss from the left lower lobe. New small right pleural effusion is identified. Extensive pulmonary nodularity throughout the right lung is identified compatible with metastatic disease. This has progressed when compared with the previous exam. Nodules are increased in multiplicity and size from previous exam. Additionally, there is new marked asymmetric interlobular septal thickening throughout the right lung which may reflect lymphangitic spread of tumor versus pulmonary edema or combination of both. Upper Abdomen: Multiple new low-density foci within the liver are concerning for metastatic disease. Index lesion within anterior dome of liver measures 0.9 cm, image 105/5. Central lobe of liver lesion measures 1.2 cm, image 120/5. No acute findings. Musculoskeletal: No chest wall abnormality. No acute or significant osseous findings. Review of the MIP images confirms the above findings. IMPRESSION: 1. No evidence for acute pulmonary embolus. 2. 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 3. Progressive mediastinal and right hilar adenopathy compatible with progressive metastatic disease. 4. Extensive pulmonary nodularity throughout the right lung is identified compatible with metastatic disease. This has progressed when compared with the previous exam. Nodules are increased in multiplicity and size from previous exam. Additionally, there is new marked asymmetric interlobular septal thickening throughout the right lung which may reflect lymphangitic spread of tumor versus pulmonary edema or combination of both. 5. Moderate left effusion is increased from previous exam. There has been progressive  atelectasis and volume loss from the left lower lobe. 6. 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 7. New small right pleural effusion. 8. Multiple new low-density foci within the liver are concerning for metastatic disease. 9. Diffuse decreased AP diameter of the trachea and lower airways concerning for chronic tracheobronchomalacia. 10. Coronary artery calcifications. 11.  Aortic Atherosclerosis (ICD10-I70.0). Electronically Signed   By: Signa Kell M.D.   On: 02/03/2023 13:11   DG Chest Portable 1 View Result Date: 02/03/2023 CLINICAL DATA:  Chest pain and shortness of breath. EXAM: PORTABLE CHEST 1 VIEW COMPARISON:  12/20/2022 FINDINGS: Stable cardiomediastinal contours scratch sec cardiomediastinal contours are obscured by pulmonary opacities. SVC stent is again seen. Persistent complete opacification of the left upper lobe. Progressive diffuse nodular interstitial opacities throughout both lungs. Which is concerning for progressive metastatic disease given the appearance of numerous tiny nodules noted on CT from 12/18/2022. IMPRESSION: 1. Persistent complete opacification of the left upper lobe. 2. Progressive diffuse nodular interstitial opacities throughout both lungs concerning for worsening metastatic disease. Electronically Signed   By: Signa Kell M.D.   On: 02/03/2023 10:25    CODE STATUS:  Code Status History     Date Active Date Inactive Code Status Order ID Comments User Context   12/18/2022 1552 12/24/2022 2039 Full Code [id number]  Modena Slater, DO ED  09/06/2021 1325 09/07/2021 2107 Full Code [id number]  Evlyn Kanner, MD ED   03/28/2020 1621 03/29/2020 2052 Full Code [id number]  Emeline General, MD ED   09/01/2019 1812 09/03/2019 2011 Full Code [id number]  Jerald Kief, MD ED   08/05/2019 1443 08/25/2019 0311 Full Code [id number]  Emeline General, MD ED   07/07/2019 1616  07/17/2019 2317 Full Code [id number]  Emeline General, MD ED   05/09/2019 0706 05/18/2019 2040 Full Code [id number]  John Giovanni, MD ED   03/20/2019 1507 03/24/2019 2243 Full Code [id number]  Emeline General, MD Inpatient   11/20/2017 0236 11/20/2017 1916 Full Code [id number]  Lorretta Harp, MD ED   08/09/2017 2312 08/17/2017 1950 Full Code [id number]  Lennox Solders, MD Inpatient   09/29/2015 2318 10/03/2015 1841 Full Code [id number]  Bobette Mo, MD Inpatient   09/29/2015 2318 09/29/2015 2318 Full Code [id number]  Bobette Mo, MD Inpatient   01/08/2015 1637 01/10/2015 1918 Full Code [id number]  Hilda Lias, MD Inpatient   06/13/2014 1008 06/16/2014 1800 Full Code [id number]  Catarina Hartshorn, MD Inpatient   04/10/2012 0109 04/11/2012 2008 Full Code [id number]  Tarry Kos, MD Inpatient   12/10/2011 2204 12/12/2011 1423 Full Code [id number]  Donivan Scull, RN Inpatient   05/22/2011 1520 05/30/2011 1553 Full Code [id number]  Minor, Vilinda Blanks, NP ED    Questions for Most Recent Historical Code Status (Order [id number])     Question Answer   By: Consent: discussion documented in EHR            Orders Placed This Encounter  Procedures   For home use only DME oxygen    SATURATION QUALIFICATIONS: (This note is used to comply with regulatory documentation for home oxygen)   Patient Saturations on Room Air at Rest = 88%   Patient Saturations on Room Air while Ambulating = 85%   Patient Saturations on 2 Liters of oxygen while Ambulating = 98%   Please briefly explain why patient needs home oxygen: Patient would benefit from home oxygen because she is hypoxic at rest and is able to move around at all without being in distress.    Length of Need:   6 Months    Mode or (Route):   Nasal cannula    Liters per Minute:   2    Frequency:   Continuous (stationary and portable oxygen unit needed)    Oxygen conserving device:   Yes    Oxygen delivery system:   Gas   CBC with Differential/Platelet     Standing Status:   Future    Number of Occurrences:   1    Expiration Date:   02/26/2024   Comprehensive metabolic panel    Standing Status:   Future    Number of Occurrences:   1    Expiration Date:   02/26/2024   Magnesium    Standing Status:   Future    Number of Occurrences:   1    Expiration Date:   02/26/2024     Future Appointments  Date Time Provider Department [place]  03/01/2023  5:00 PM [facility]MR 1 [facility]  03/03/2023  8:30 AM [facility] PA/NP [facility] None  03/03/2023 11:00 AM Dorothy Puffer, MD [facility] None  03/10/2023 12:00 PM Dorothy Puffer, MD [facility] None  03/12/2023  9:30 AM [facility] FLUSH [facility] None  03/12/2023 10:00 AM Cam Harnden, MD [facility] None  03/12/2023 11:00 AM [facility] INFUSION [facility] None  03/31/2023 10:00 AM Levy Pupa  S, MD [facility] None  04/07/2023 10:40 AM Tina Andrew, NP [facility] None  04/28/2023 11:00 AM Dimple Casey, Jamesetta Orleans, MD [facility] None  07/01/2023  1:50 PM [facility]-ANNUAL WELLNESS VISIT [facility] None  07/21/2023  9:30 AM Eustace Pen, PA-C [facility] None      This document was completed utilizing speech recognition software. Grammatical errors, random word insertions, pronoun errors, and incomplete sentences are an occasional consequence of this system due to software limitations, ambient noise, and hardware issues. Any formal questions or concerns about the content, text or information contained within the body of this dictation should be directly addressed to the provider for clarification.

## 2023-02-26 NOTE — Progress Notes (Signed)
SATURATION QUALIFICATIONS: (This note is used to comply with regulatory documentation for home oxygen)  Patient Saturations on Room Air at Rest = 88%  Patient Saturations on Room Air while Ambulating = 85%  Patient Saturations on 2 Liters of oxygen while Ambulating = 98%  Please briefly explain why patient needs home oxygen: Patient would benefit from home oxygen because she is hypoxic at rest and is able to move around at all without being in distress.

## 2023-02-26 NOTE — Assessment & Plan Note (Signed)
-  Please review oncology history for additional details and timeline of events.  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  -Her case was discussed in tumor conference on 01/07/2023.  Consensus opinion is that these lung nodules are malignancy related.  Hence clinically it appears to be stage IVa disease.  All treatment options are palliative in nature and not curative in intent and this was explained to patient and her fianc and they verbalized understanding.  -Previously we submitted request for [employer] [facility] mutation testing on the specimen.  It showed no actionable mutations.  -Plan for chemotherapy with immunotherapy or immunotherapy alone.  Since Alimta could not be used because she is on hemodialysis, will plan for carboplatin, paclitaxel, pembrolizumab for 4 cycles followed by maintenance treatments with pembrolizumab alone every 3 weeks.  We will dose reduce carboplatin to AUC 4 and paclitaxel to 150 mg/m, given dialysis status.  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  She could not even receive PICC line placement and ended up getting midline placed in the left arm.  IR suggested that we should check with nephrology to see if we can use her dialysis catheter for chemotherapy.  Dr. Malen Gauze, her nephrologist approved use of dialysis catheter for chemo, in case it is needed.  However she does not have any other access left and if femoral dialysis catheter were to fail, then she has no other option for dialysis other than possibly a transhepatic catheter and if that fails, then palliative care and death.  -We were able to get peripheral IV inserted using the help of for IV team and proceed with cycle 1 of carboplatin and paclitaxel on 02/17/2023.  She received Keytruda first dose on 02/12/2023.  Today she presented for toxicity evaluation.   Tolerated cycle 1 overall well with expected side effects of decreased appetite and altered taste.  Labs today reveal no dose-limiting toxicities.  -Pancreatic lesion biopsy at [facility] on 02/10/2023 came back positive for adenocarcinoma.  Unsure if it is primary pancreatic or metastatic from lung.  Immunostains were not obtained and we have submitted request for this.  MRI of the brain from 01/29/2023 showed 1.2 cm enhancing mass in the left insula, consistent with metastatic disease.  Started her on Decadron 4 mg p.o. twice daily and referral sent to radiation oncology for further evaluation and management.  She was evaluated by Dr. Mitzi Hansen.  Plan for SRS in a single session on 03/10/2023.  Recent CT of the chest incidentally also showed liver lesions which are new and concerning for metastatic disease.    On 02/22/2023, CT abdomen and pelvis showed similar findings but indeterminate.  Metastatic disease could not be ruled out.  Following results of pancreatic biopsy immunostains, we will consider liver biopsy to determine etiology of liver lesions-lung versus pancreatic primary.  Cycle 2 is tentatively scheduled for 03/12/2023.

## 2023-02-26 NOTE — Assessment & Plan Note (Signed)
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   -Case reviewed by Dr. Meridee Score who recommended workup at a tertiary GI center.  She had biopsy of this on 02/10/2023 at [facility].  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  However immunostains were not obtained.  Previously we requested immunostains to be submitted to determine whether or if it is pancreatic primary versus metastatic disease from lung.  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  Further plan of care to be determined depending on these results.  -Her CA 19-9 was undetectable but CEA was elevated at 230 on 12/30/2022.  This will be monitored on future visits.

## 2023-02-27 ENCOUNTER — Other Ambulatory Visit: Payer: Self-pay

## 2023-02-27 ENCOUNTER — Encounter (HOSPITAL_COMMUNITY): Payer: Self-pay | Admitting: Student

## 2023-02-27 ENCOUNTER — Inpatient Hospital Stay (HOSPITAL_COMMUNITY)
Admission: EM | Admit: 2023-02-27 | Discharge: 2023-03-01 | DRG: 640 | Disposition: A | Discharge status: Home / Self Care | Payer: Medicare Other | Attending: Internal Medicine | Admitting: Internal Medicine

## 2023-02-27 ENCOUNTER — Emergency Department (HOSPITAL_COMMUNITY): Payer: Medicare Other

## 2023-02-27 DIAGNOSIS — Z7901 Long term (current) use of anticoagulants: Secondary | ICD-10-CM

## 2023-02-27 DIAGNOSIS — C259 Malignant neoplasm of pancreas, unspecified: Secondary | ICD-10-CM

## 2023-02-27 DIAGNOSIS — C3411 Malignant neoplasm of upper lobe, right bronchus or lung: Secondary | ICD-10-CM

## 2023-02-27 DIAGNOSIS — C799 Secondary malignant neoplasm of unspecified site: Secondary | ICD-10-CM | POA: Diagnosis present

## 2023-02-27 DIAGNOSIS — C3490 Malignant neoplasm of unspecified part of unspecified bronchus or lung: Secondary | ICD-10-CM | POA: Diagnosis present

## 2023-02-27 DIAGNOSIS — Z940 Kidney transplant status: Secondary | ICD-10-CM

## 2023-02-27 DIAGNOSIS — Z8673 Personal history of transient ischemic attack (TIA), and cerebral infarction without residual deficits: Secondary | ICD-10-CM

## 2023-02-27 DIAGNOSIS — Z8711 Personal history of peptic ulcer disease: Secondary | ICD-10-CM

## 2023-02-27 DIAGNOSIS — Z820 Family history of epilepsy and other diseases of the nervous system: Secondary | ICD-10-CM

## 2023-02-27 DIAGNOSIS — K3184 Gastroparesis: Secondary | ICD-10-CM | POA: Diagnosis not present

## 2023-02-27 DIAGNOSIS — Z86718 Personal history of other venous thrombosis and embolism: Secondary | ICD-10-CM

## 2023-02-27 DIAGNOSIS — M545 Low back pain, unspecified: Secondary | ICD-10-CM | POA: Diagnosis present

## 2023-02-27 DIAGNOSIS — Z8249 Family history of ischemic heart disease and other diseases of the circulatory system: Secondary | ICD-10-CM

## 2023-02-27 DIAGNOSIS — I4892 Unspecified atrial flutter: Secondary | ICD-10-CM | POA: Diagnosis present

## 2023-02-27 DIAGNOSIS — H5461 Unqualified visual loss, right eye, normal vision left eye: Secondary | ICD-10-CM | POA: Diagnosis present

## 2023-02-27 DIAGNOSIS — Z833 Family history of diabetes mellitus: Secondary | ICD-10-CM

## 2023-02-27 DIAGNOSIS — Z87891 Personal history of nicotine dependence: Secondary | ICD-10-CM

## 2023-02-27 DIAGNOSIS — Z8616 Personal history of COVID-19: Secondary | ICD-10-CM

## 2023-02-27 DIAGNOSIS — Z905 Acquired absence of kidney: Secondary | ICD-10-CM

## 2023-02-27 DIAGNOSIS — Z7951 Long term (current) use of inhaled steroids: Secondary | ICD-10-CM

## 2023-02-27 DIAGNOSIS — K5900 Constipation, unspecified: Secondary | ICD-10-CM | POA: Diagnosis present

## 2023-02-27 DIAGNOSIS — N186 End stage renal disease: Secondary | ICD-10-CM | POA: Diagnosis present

## 2023-02-27 DIAGNOSIS — K219 Gastro-esophageal reflux disease without esophagitis: Secondary | ICD-10-CM | POA: Diagnosis present

## 2023-02-27 DIAGNOSIS — N2581 Secondary hyperparathyroidism of renal origin: Secondary | ICD-10-CM | POA: Diagnosis present

## 2023-02-27 DIAGNOSIS — Z881 Allergy status to other antibiotic agents status: Secondary | ICD-10-CM

## 2023-02-27 DIAGNOSIS — D631 Anemia in chronic kidney disease: Secondary | ICD-10-CM | POA: Diagnosis present

## 2023-02-27 DIAGNOSIS — I4891 Unspecified atrial fibrillation: Secondary | ICD-10-CM | POA: Diagnosis present

## 2023-02-27 DIAGNOSIS — I9589 Other hypotension: Secondary | ICD-10-CM | POA: Diagnosis present

## 2023-02-27 DIAGNOSIS — E1122 Type 2 diabetes mellitus with diabetic chronic kidney disease: Secondary | ICD-10-CM | POA: Diagnosis present

## 2023-02-27 DIAGNOSIS — J449 Chronic obstructive pulmonary disease, unspecified: Secondary | ICD-10-CM | POA: Diagnosis present

## 2023-02-27 DIAGNOSIS — I509 Heart failure, unspecified: Secondary | ICD-10-CM | POA: Diagnosis present

## 2023-02-27 DIAGNOSIS — Z83511 Family history of glaucoma: Secondary | ICD-10-CM

## 2023-02-27 DIAGNOSIS — Z9221 Personal history of antineoplastic chemotherapy: Secondary | ICD-10-CM

## 2023-02-27 DIAGNOSIS — G894 Chronic pain syndrome: Secondary | ICD-10-CM | POA: Diagnosis present

## 2023-02-27 DIAGNOSIS — Z79899 Other long term (current) drug therapy: Secondary | ICD-10-CM

## 2023-02-27 DIAGNOSIS — R1033 Periumbilical pain: Secondary | ICD-10-CM | POA: Diagnosis not present

## 2023-02-27 DIAGNOSIS — E876 Hypokalemia: Principal | ICD-10-CM | POA: Diagnosis present

## 2023-02-27 DIAGNOSIS — F32A Depression, unspecified: Secondary | ICD-10-CM | POA: Diagnosis present

## 2023-02-27 DIAGNOSIS — J9611 Chronic respiratory failure with hypoxia: Secondary | ICD-10-CM | POA: Diagnosis present

## 2023-02-27 DIAGNOSIS — Z9981 Dependence on supplemental oxygen: Secondary | ICD-10-CM

## 2023-02-27 DIAGNOSIS — Z981 Arthrodesis status: Secondary | ICD-10-CM

## 2023-02-27 DIAGNOSIS — I132 Hypertensive heart and chronic kidney disease with heart failure and with stage 5 chronic kidney disease, or end stage renal disease: Secondary | ICD-10-CM | POA: Diagnosis present

## 2023-02-27 DIAGNOSIS — E871 Hypo-osmolality and hyponatremia: Secondary | ICD-10-CM | POA: Diagnosis present

## 2023-02-27 DIAGNOSIS — M797 Fibromyalgia: Secondary | ICD-10-CM | POA: Diagnosis present

## 2023-02-27 DIAGNOSIS — Z91048 Other nonmedicinal substance allergy status: Secondary | ICD-10-CM

## 2023-02-27 DIAGNOSIS — Z992 Dependence on renal dialysis: Secondary | ICD-10-CM

## 2023-02-27 DIAGNOSIS — H409 Unspecified glaucoma: Secondary | ICD-10-CM | POA: Diagnosis present

## 2023-02-27 DIAGNOSIS — E1143 Type 2 diabetes mellitus with diabetic autonomic (poly)neuropathy: Secondary | ICD-10-CM | POA: Diagnosis present

## 2023-02-27 DIAGNOSIS — M8580 Other specified disorders of bone density and structure, unspecified site: Secondary | ICD-10-CM | POA: Diagnosis present

## 2023-02-27 DIAGNOSIS — Z800 Family history of malignant neoplasm of digestive organs: Secondary | ICD-10-CM

## 2023-02-27 DIAGNOSIS — D696 Thrombocytopenia, unspecified: Secondary | ICD-10-CM | POA: Diagnosis present

## 2023-02-27 DIAGNOSIS — R109 Unspecified abdominal pain: Principal | ICD-10-CM | POA: Diagnosis present

## 2023-02-27 DIAGNOSIS — D72829 Elevated white blood cell count, unspecified: Secondary | ICD-10-CM | POA: Diagnosis present

## 2023-02-27 DIAGNOSIS — E1142 Type 2 diabetes mellitus with diabetic polyneuropathy: Secondary | ICD-10-CM | POA: Diagnosis present

## 2023-02-27 DIAGNOSIS — M1A9XX0 Chronic gout, unspecified, without tophus (tophi): Secondary | ICD-10-CM | POA: Diagnosis present

## 2023-02-27 LAB — CBC WITH DIFFERENTIAL/PLATELET
Abs Immature Granulocytes: 6.09 10*3/uL — ABNORMAL HIGH (ref 0.00–0.07)
Basophils Absolute: 0 10*3/uL (ref 0.0–0.1)
Basophils Relative: 0 %
Eosinophils Absolute: 0 10*3/uL (ref 0.0–0.5)
Eosinophils Relative: 0 %
HCT: 23.1 % — ABNORMAL LOW (ref 36.0–46.0)
Hemoglobin: 7.4 g/dL — ABNORMAL LOW (ref 12.0–15.0)
Immature Granulocytes: 17 %
Lymphocytes Relative: 5 %
Lymphs Abs: 1.7 10*3/uL (ref 0.7–4.0)
MCH: 28.6 pg (ref 26.0–34.0)
MCHC: 32 g/dL (ref 30.0–36.0)
MCV: 89.2 fL (ref 80.0–100.0)
Monocytes Absolute: 2.5 10*3/uL — ABNORMAL HIGH (ref 0.1–1.0)
Monocytes Relative: 7 %
Neutro Abs: 25.3 10*3/uL — ABNORMAL HIGH (ref 1.7–7.7)
Neutrophils Relative %: 71 %
Platelets: 95 10*3/uL — ABNORMAL LOW (ref 150–400)
RBC: 2.59 MIL/uL — ABNORMAL LOW (ref 3.87–5.11)
RDW: 17.6 % — ABNORMAL HIGH (ref 11.5–15.5)
Smear Review: DECREASED
WBC: 35.6 10*3/uL — ABNORMAL HIGH (ref 4.0–10.5)
nRBC: 0.1 % (ref 0.0–0.2)

## 2023-02-27 LAB — COMPREHENSIVE METABOLIC PANEL
ALT: 15 U/L (ref 0–44)
AST: 24 U/L (ref 15–41)
Albumin: 3.2 g/dL — ABNORMAL LOW (ref 3.5–5.0)
Alkaline Phosphatase: 203 U/L — ABNORMAL HIGH (ref 38–126)
Anion gap: 15 (ref 5–15)
BUN: 29 mg/dL — ABNORMAL HIGH (ref 6–20)
CO2: 25 mmol/L (ref 22–32)
Calcium: 10.1 mg/dL (ref 8.9–10.3)
Chloride: 90 mmol/L — ABNORMAL LOW (ref 98–111)
Creatinine, Ser: 5.91 mg/dL — ABNORMAL HIGH (ref 0.44–1.00)
GFR, Estimated: 8 mL/min — ABNORMAL LOW (ref >60)
Glucose, Bld: 114 mg/dL — ABNORMAL HIGH (ref 70–99)
Potassium: 3.2 mmol/L — ABNORMAL LOW (ref 3.5–5.1)
Sodium: 130 mmol/L — ABNORMAL LOW (ref 135–145)
Total Bilirubin: 0.6 mg/dL (ref 0.0–1.2)
Total Protein: 6.1 g/dL — ABNORMAL LOW (ref 6.5–8.1)

## 2023-02-27 LAB — LIPASE, BLOOD: Lipase: 30 U/L (ref 11–51)

## 2023-02-27 LAB — TROPONIN I (HIGH SENSITIVITY)
Troponin I (High Sensitivity): 20 ng/L — ABNORMAL HIGH (ref <18)
Troponin I (High Sensitivity): 17 ng/L (ref <18)

## 2023-02-27 MED ORDER — MIDODRINE HCL 5 MG PO TABS
15.0000 mg | ORAL_TABLET | Freq: Three times a day (TID) | ORAL | Status: DC
  Administered 2023-02-27 – 2023-03-01 (×5): 15 mg via ORAL
  Filled 2023-02-27 (×6): qty 3

## 2023-02-27 MED ORDER — FAMOTIDINE 20 MG PO TABS
20.0000 mg | ORAL_TABLET | Freq: Two times a day (BID) | ORAL | Status: DC
  Administered 2023-02-27 – 2023-03-01 (×5): 20 mg via ORAL
  Filled 2023-02-27 (×5): qty 1

## 2023-02-27 MED ORDER — LIDOCAINE HCL (PF) 1 % IJ SOLN: 5.0000 mL | INTRAMUSCULAR | Status: DC | PRN

## 2023-02-27 MED ORDER — GABAPENTIN 100 MG PO CAPS: 100.0000 mg | ORAL_CAPSULE | Freq: Two times a day (BID) | ORAL | Status: DC | PRN

## 2023-02-27 MED ORDER — METOCLOPRAMIDE HCL 5 MG/ML IJ SOLN
5.0000 mg | Freq: Three times a day (TID) | INTRAMUSCULAR | Status: DC
  Filled 2023-02-27 (×2): qty 2

## 2023-02-27 MED ORDER — CHLORHEXIDINE GLUCONATE CLOTH 2 % EX PADS
6.0000 | MEDICATED_PAD | Freq: Every day | CUTANEOUS | Status: DC
Start: 2023-02-28 — End: 2023-03-01
  Administered 2023-02-28 – 2023-03-01 (×2): 6 via TOPICAL

## 2023-02-27 MED ORDER — HYDROMORPHONE HCL 1 MG/ML IJ SOLN
0.5000 mg | INTRAMUSCULAR | Status: DC | PRN
  Administered 2023-02-27 – 2023-03-01 (×8): 0.5 mg via INTRAVENOUS
  Filled 2023-02-27 (×8): qty 0.5

## 2023-02-27 MED ORDER — SERTRALINE HCL 25 MG PO TABS
25.0000 mg | ORAL_TABLET | Freq: Every day | ORAL | Status: DC
  Administered 2023-02-28 – 2023-03-01 (×2): 25 mg via ORAL
  Filled 2023-02-27 (×2): qty 1

## 2023-02-27 MED ORDER — AMIODARONE HCL 200 MG PO TABS
100.0000 mg | ORAL_TABLET | Freq: Every day | ORAL | Status: DC
Start: 2023-02-27 — End: 2023-03-01
  Administered 2023-02-28 – 2023-03-01 (×2): 100 mg via ORAL
  Filled 2023-02-27 (×3): qty 1

## 2023-02-27 MED ORDER — METOCLOPRAMIDE HCL 5 MG PO TABS
10.0000 mg | ORAL_TABLET | Freq: Three times a day (TID) | ORAL | Status: DC
  Administered 2023-02-27 – 2023-03-01 (×5): 10 mg via ORAL
  Filled 2023-02-27 (×4): qty 2
  Filled 2023-02-27: qty 1

## 2023-02-27 MED ORDER — SUCROFERRIC OXYHYDROXIDE 500 MG PO CHEW
500.0000 mg | CHEWABLE_TABLET | Freq: Three times a day (TID) | ORAL | Status: DC
  Administered 2023-02-27 – 2023-03-01 (×5): 500 mg via ORAL
  Filled 2023-02-27 (×7): qty 1

## 2023-02-27 MED ORDER — PENTAFLUOROPROP-TETRAFLUOROETH EX AERO: 1.0000 | INHALATION_SPRAY | CUTANEOUS | Status: DC | PRN

## 2023-02-27 MED ORDER — ACETAMINOPHEN 650 MG RE SUPP: 650.0000 mg | Freq: Four times a day (QID) | RECTAL | Status: DC | PRN

## 2023-02-27 MED ORDER — HYDROMORPHONE HCL 1 MG/ML IJ SOLN
1.0000 mg | Freq: Once | INTRAMUSCULAR | Status: AC
  Administered 2023-02-27: 1 mg via INTRAVENOUS
  Filled 2023-02-27: qty 1

## 2023-02-27 MED ORDER — NEPRO/CARBSTEADY PO LIQD
237.0000 mL | ORAL | Status: DC | PRN
  Filled 2023-02-27: qty 237

## 2023-02-27 MED ORDER — ALTEPLASE 2 MG IJ SOLR: 2.0000 mg | Freq: Once | INTRAMUSCULAR | Status: DC | PRN

## 2023-02-27 MED ORDER — SODIUM CHLORIDE 0.9% FLUSH: 10.0000 mL | INTRAVENOUS | Status: DC | PRN

## 2023-02-27 MED ORDER — APIXABAN 2.5 MG PO TABS
2.5000 mg | ORAL_TABLET | Freq: Two times a day (BID) | ORAL | Status: DC
Start: 2023-02-27 — End: 2023-03-01
  Administered 2023-02-28 – 2023-03-01 (×4): 2.5 mg via ORAL
  Filled 2023-02-27 (×4): qty 1

## 2023-02-27 MED ORDER — OXYCODONE-ACETAMINOPHEN 5-325 MG PO TABS: 2.0000 | ORAL_TABLET | Freq: Four times a day (QID) | ORAL | Status: DC | PRN

## 2023-02-27 MED ORDER — HYDROMORPHONE HCL 1 MG/ML IJ SOLN
1.0000 mg | Freq: Once | INTRAMUSCULAR | Status: AC
Start: 2023-02-27 — End: 2023-02-27
  Administered 2023-02-27: 1 mg via INTRAVENOUS
  Filled 2023-02-27: qty 1

## 2023-02-27 MED ORDER — MOMETASONE FURO-FORMOTEROL FUM 200-5 MCG/ACT IN AERO
2.0000 | INHALATION_SPRAY | Freq: Two times a day (BID) | RESPIRATORY_TRACT | Status: DC
  Administered 2023-02-28 – 2023-03-01 (×3): 2 via RESPIRATORY_TRACT
  Filled 2023-02-27: qty 8.8

## 2023-02-27 MED ORDER — LIDOCAINE-PRILOCAINE 2.5-2.5 % EX CREA: 1.0000 | TOPICAL_CREAM | CUTANEOUS | Status: DC | PRN

## 2023-02-27 MED ORDER — HEPARIN SODIUM (PORCINE) 1000 UNIT/ML DIALYSIS
1000.0000 [IU] | INTRAMUSCULAR | Status: DC | PRN
  Filled 2023-02-27: qty 1

## 2023-02-27 MED ORDER — SODIUM CHLORIDE 0.9% FLUSH
10.0000 mL | Freq: Two times a day (BID) | INTRAVENOUS | Status: DC
  Administered 2023-02-28 – 2023-03-01 (×4): 10 mL

## 2023-02-27 MED ORDER — ANTICOAGULANT SODIUM CITRATE 4% (200MG/5ML) IV SOLN
5.0000 mL | Status: DC | PRN
  Filled 2023-02-27: qty 5

## 2023-02-27 MED ORDER — POLYETHYLENE GLYCOL 3350 17 G PO PACK: 17.0000 g | PACK | Freq: Every day | ORAL | Status: DC | PRN

## 2023-02-27 MED ORDER — OXYCODONE-ACETAMINOPHEN 5-325 MG PO TABS: 1.0000 | ORAL_TABLET | ORAL | Status: DC | PRN

## 2023-02-27 MED ORDER — SENNOSIDES-DOCUSATE SODIUM 8.6-50 MG PO TABS
1.0000 | ORAL_TABLET | Freq: Every day | ORAL | Status: DC
  Administered 2023-02-28 (×2): 1 via ORAL
  Filled 2023-02-27 (×2): qty 1

## 2023-02-27 MED ORDER — DORZOLAMIDE HCL-TIMOLOL MAL 2-0.5 % OP SOLN
1.0000 [drp] | Freq: Two times a day (BID) | OPHTHALMIC | Status: DC
  Administered 2023-02-28 (×3): 1 [drp] via OPHTHALMIC
  Filled 2023-02-27: qty 10

## 2023-02-27 MED ORDER — ACETAMINOPHEN 325 MG PO TABS: 650.0000 mg | ORAL_TABLET | Freq: Four times a day (QID) | ORAL | Status: DC | PRN

## 2023-02-27 MED ORDER — METOCLOPRAMIDE HCL 5 MG/ML IJ SOLN
5.0000 mg | Freq: Once | INTRAMUSCULAR | Status: AC
  Administered 2023-02-27: 5 mg via INTRAVENOUS
  Filled 2023-02-27: qty 2

## 2023-02-27 MED ORDER — IOHEXOL 350 MG/ML SOLN
75.0000 mL | Freq: Once | INTRAVENOUS | Status: AC | PRN
  Administered 2023-02-27: 75 mL via INTRAVENOUS

## 2023-02-27 NOTE — H&P (Addendum)
History and Physical    Patient: Tina Watkins NWG:[id number] DOB: September 08, 1965 DOA: 02/27/2023 DOS: the patient was seen and examined on 02/27/2023 PCP: Ivonne Andrew, NP  Patient coming from: Home  Chief Complaint:  Chief Complaint  Patient presents with   Abdominal Pain   Emesis   HPI: Tina Watkins is a 58 y.o. female with medical history significant of ESRD on HD, CHF, atrial fibrillation/flutter, COPD, depression, chronic gout, chronic pain, anemia of chronic renal disease, gastroparesis, midodrine dependent hypotension, history of CVA, pancreatic cancer, lung cancer presenting to the ED with abdominal pain, nausea, vomiting.  Patient states that she is currently being treated for cancer and follows oncology.  She received her first chemotherapy session 2 weeks ago and tolerated it fairly well.  She reports that she was in her usual state of health until early this morning.  Around 0315, she woke up with abdominal pain that was associated with nausea.  She states that the pain was right in the middle of her stomach and has not radiated anywhere.  She also reports 2 episodes of nonbloody, nonbilious vomiting prior to coming to the hospital.  She does report a history of gastroparesis and reports having experienced similar symptoms in the past due to this.  She usually takes Reglan 10 mg once a day at home but reports missing her Reglan doses for the past few days.  She denies any fevers, chills, chest pain, palpitations, shortness of breath.  She also states that she experienced diarrhea about 4 days ago and took Imodium for it.  Her diarrhea resolved but she has experienced constipation for the past 3 days since that time and has not passed a bowel movement in these past 3 days.  She is due for her dialysis today.  ED course: Vital signs stable, saturating 99% on home 2 L nasal cannula.  Blood pressure ranging in the 90s-100s/70s.  CBC with anemia (around her baseline of 8),  thrombocytopenia, leukocytosis to 23.8.  CMP with mild hyponatremia and elevated alk phos, otherwise unremarkable.  Lipase normal.  High-sensitivity troponin mildly elevated at 20, awaiting repeat.  Chest x-ray showing near complete opacification of left hemithorax and stable findings in regards to known lung cancer.  CT abdomen pelvis with contrast showing hepatic hypodensities slightly increased from recent CT on 1/20 along with unchanged right pulmonary nodules and unchanged solid and cystic pancreatic mass.  [facility] asked to evaluate patient for admission.  Review of Systems: As mentioned in the history of present illness. All other systems reviewed and are negative. Past Medical History:  Diagnosis Date   Anxiety    Bacteremia due to Gram-negative bacteria 05/23/2011   Blind    right eye   Blind right eye    CHF (congestive heart failure) (HCC)    Chronic lower back pain    Complication of anesthesia    "woke up during OR; I have an extremely high tolerance" (12/11/2011) 1 procedure was graft; the other procedures were procedures that are typically done with sedation.   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   Dysrhythmia    "tachycardia" (12/11/2011) new onset afib 10/15/14 EKG   E coli bacteremia 06/18/2011   Elevated LDL cholesterol level 12/2018   ESRD (end stage renal disease) (HCC) 06/12/2011   Tues-Thurs-Sat dialysis   Fibromyalgia    Gastroparesis    GERD (gastroesophageal reflux disease)    Glaucoma    right  eye   Gout    H/O carpal tunnel syndrome    Headache 10/2019   no current problem per patient on 11/12/22   Headache(784.0)    "not often anymore" (12/11/2011)   Herpes genitalia 1994   History of blood transfusion    "more than a few times" (12/11/2011)   History of COVID-19, most recently 09/2022 05/09/2019   History of deep vein thrombosis (DVT) of right lower extremity 11/20/2017   History of stomach  ulcers    Hypotension    Iron deficiency anemia    New onset a-fib (HCC)    10/15/14 EKG   Osteopenia    Peripheral neuropathy 11/2018   Pneumonia    x several but years ago   Pressure ulcer of sacral region, stage 1 07/2019   Seizures (HCC) 1994   "post transplant; only have had that one" (12/11/2011)   Spinal stenosis in cervical region    Stroke [facility])     left basal ganglia lacunar infarct; Right frontal lobe lacunar infarct.   Stroke [facility]) ~ 1999; 2001   "briefly lost my vision; lost my right eye" (12/11/2011)   Vitamin D deficiency 12/2018   Past Surgical History:  Procedure Laterality Date   ANTERIOR CERVICAL DECOMP/DISCECTOMY FUSION N/A 01/08/2015   Procedure: Anterior Cervical Three-Four/Four-Five Decompression/Diskectomy/Fusion;  Surgeon: Hilda Lias, MD;  Location: [facility] NEURO ORS;  Service: Neurosurgery;  Laterality: N/A;  C3-4 C4-5 Anterior cervical decompression/diskectomy/fusion   APPENDECTOMY  ~ 2004   BIOPSY  09/12/2020   Procedure: BIOPSY;  Surgeon: Rachael Fee, MD;  Location: [facility] ENDOSCOPY;  Service: Endoscopy;;   BRONCHIAL BIOPSY  11/16/2022   Procedure: BRONCHIAL BIOPSIES;  Surgeon: Leslye Peer, MD;  Location: [facility] ENDOSCOPY;  Service: Pulmonary;;   BRONCHIAL BRUSHINGS  11/16/2022   Procedure: BRONCHIAL BRUSHINGS;  Surgeon: Leslye Peer, MD;  Location: [facility] ENDOSCOPY;  Service: Pulmonary;;   BRONCHIAL NEEDLE ASPIRATION BIOPSY  11/16/2022   Procedure: BRONCHIAL NEEDLE ASPIRATION BIOPSIES;  Surgeon: Leslye Peer, MD;  Location: [facility] ENDOSCOPY;  Service: Pulmonary;;   CATARACT EXTRACTION     right eye   COLONOSCOPY     COLONOSCOPY WITH PROPOFOL N/A 09/12/2020   Procedure: COLONOSCOPY WITH PROPOFOL;  Surgeon: Rachael Fee, MD;  Location: [facility] ENDOSCOPY;  Service: Endoscopy;  Laterality: N/A;   ENUCLEATION  2001   "right"   ESOPHAGOGASTRODUODENOSCOPY (EGD) WITH PROPOFOL N/A 04/21/2012   Procedure: ESOPHAGOGASTRODUODENOSCOPY (EGD) WITH PROPOFOL;  Surgeon:  Rachael Fee, MD;  Location: [facility] ENDOSCOPY;  Service: Endoscopy;  Laterality: N/A;   ESOPHAGOGASTRODUODENOSCOPY (EGD) WITH PROPOFOL N/A 09/12/2020   Procedure: ESOPHAGOGASTRODUODENOSCOPY (EGD) WITH PROPOFOL;  Surgeon: Rachael Fee, MD;  Location: [facility] ENDOSCOPY;  Service: Endoscopy;  Laterality: N/A;   FIDUCIAL MARKER PLACEMENT  11/16/2022   Procedure: FIDUCIAL MARKER PLACEMENT;  Surgeon: Leslye Peer, MD;  Location: [facility] ENDOSCOPY;  Service: Pulmonary;;   HEMOSTASIS CONTROL  11/16/2022   Procedure: HEMOSTASIS CONTROL;  Surgeon: Leslye Peer, MD;  Location: [facility] ENDOSCOPY;  Service: Pulmonary;;   INSERTION OF DIALYSIS CATHETER  1988   "AV graft LUA & LFA; LUA worked for 1 day; LFA never worked"   IR FLUORO GUIDE CV LINE RIGHT  07/11/2019   IR FLUORO GUIDE CV LINE RIGHT  10/20/2019   IR FLUORO GUIDE CV LINE RIGHT  05/31/2020   IR PTA VENOUS EXCEPT DIALYSIS CIRCUIT  05/31/2020   IR RADIOLOGY PERIPHERAL GUIDED IV START  07/11/2019   IR US GUIDE VASC ACCESS RIGHT  07/11/2019  IR US GUIDE VASC ACCESS RIGHT  07/11/2019   IR US GUIDE VASC ACCESS RIGHT  07/11/2019   IR VENOCAVAGRAM IVC  05/31/2020   KIDNEY TRANSPLANT  1994; 1999; 2005   "right"   MULTIPLE TOOTH EXTRACTIONS     POLYPECTOMY  09/12/2020   Procedure: POLYPECTOMY;  Surgeon: Rachael Fee, MD;  Location: [facility] ENDOSCOPY;  Service: Endoscopy;;   RIGHT HEART CATH N/A 03/23/2019   Procedure: RIGHT HEART CATH;  Surgeon: Dolores Patty, MD;  Location: [facility] INVASIVE CV LAB;  Service: Cardiovascular;  Laterality: N/A;   TONSILLECTOMY     TOTAL NEPHRECTOMY  1988?; 1994; 2005   VIDEO BRONCHOSCOPY WITH RADIAL ENDOBRONCHIAL ULTRASOUND  11/16/2022   Procedure: VIDEO BRONCHOSCOPY WITH RADIAL ENDOBRONCHIAL ULTRASOUND;  Surgeon: Leslye Peer, MD;  Location: [facility] ENDOSCOPY;  Service: Pulmonary;;   Social History:  reports that she has quit smoking. Her smoking use included cigarettes. She has a 52.5 pack-year smoking history. She has never been exposed to  tobacco smoke. She has never used smokeless tobacco. She reports that she does not currently use alcohol. She reports current drug use. Frequency: 1.00 time per week. Drug: Marijuana.  Allergies  Allergen Reactions   Levofloxacin Itching and Rash   Tape Other (See Comments)    "Certain surgical tapes peel off my skin"    Family History  Problem Relation Age of Onset   Glaucoma Mother    Pancreatic cancer Father    Multiple sclerosis Brother    Diabetes Maternal Uncle    Hypertension Maternal Grandmother    Breast cancer Neg Hx     Prior to Admission medications   Medication Sig Start Date End Date Taking? Authorizing Provider  allopurinol (ZYLOPRIM) 100 MG tablet Take 0.5 tablets (50 mg total) by mouth 2 (two) times a week. Patient taking differently: Take 100 mg by mouth daily in the afternoon. 12/24/22   Tawkaliyar, Roya, DO  amiodarone (PACERONE) 200 MG tablet Take 0.5 tablets (100 mg total) by mouth daily. 07/03/22   Eustace Pen, PA-C  apixaban (ELIQUIS) 2.5 MG TABS tablet Take 1 tablet (2.5 mg total) by mouth 2 (two) times daily. Okay to restart this medication on 11/18/2022 11/16/22   Leslye Peer, MD  Colchicine 0.6 MG CAPS Take 0.6 mg by mouth daily as needed (gout flare up). 01/12/22   Ivonne Andrew, NP  cyclobenzaprine (FLEXERIL) 5 MG tablet Take 1 tablet (5 mg total) by mouth at bedtime as needed. 08/21/22   Rice, Jamesetta Orleans, MD  dexamethasone (DECADRON) 4 MG tablet Take 2 tablets (8mg ) by mouth daily starting the day after carboplatin for 3 days. Take with food 01/20/23   Pasam, Avinash, MD  dexamethasone (DECADRON) 4 MG tablet Take 1 tablet (4 mg total) by mouth 2 (two) times daily with a meal. 02/15/23   Pasam, Avinash, MD  diclofenac Sodium (VOLTAREN) 1 % GEL Apply 4 g topically as needed. 06/22/22   Ivonne Andrew, NP  diphenhydrAMINE (BENADRYL) 25 mg capsule Take 1 capsule (25 mg total) by mouth every 8 (eight) hours as needed. 07/21/19   Kallie Locks,  FNP  dorzolamide-timolol (COSOPT) 2-0.5 % ophthalmic solution Place 1 drop into the left eye 2 (two) times daily. 12/13/21   [provider]  famotidine (PEPCID) 20 MG tablet TAKE 1 TABLET BY MOUTH TWICE A DAY 03/10/22   Ivonne Andrew, NP  fluticasone-salmeterol (ADVAIR HFA) 115-21 MCG/ACT inhaler Inhale 2 puffs into the lungs 2 (two) times daily. 04/28/21  Martina Sinner, MD  gabapentin (NEURONTIN) 100 MG capsule Take 100 mg by mouth 3 (three) times daily as needed (nerve pain).    [provider]  heparin sodium, porcine, 1000 UNIT/ML injection Inject 1,000 Units into the peritoneum once. 09/03/22 09/02/23  [provider]  lidocaine (LIDODERM) 5 % Place 1 patch onto the skin daily as needed (for pain- remove old patch first).  10/28/18   [provider]  lidocaine-prilocaine (EMLA) cream Apply to affected area once 01/20/23   Pasam, Avinash, MD  metoCLOPramide (REGLAN) 10 MG tablet Take 1 tablet (10 mg total) by mouth 3 (three) times daily before meals. Patient taking differently: Take 10 mg by mouth daily in the afternoon. 12/02/22   Meryl Dare, MD  midodrine (PROAMATINE) 10 MG tablet TAKE 1.5 TABLETS (15 MG TOTAL) BY MOUTH 3 (THREE) TIMES DAILY. NEEDS FOLLOW UP APPOINTMENT FOR ANYMORE REFILLS 01/18/23   Bensimhon, Bevelyn Buckles, MD  omeprazole (PRILOSEC) 40 MG capsule TAKE 1 CAPSULE (40 MG TOTAL) BY MOUTH DAILY. 01/15/23   Meryl Dare, MD  ondansetron (ZOFRAN) 8 MG tablet Take 1 tablet (8 mg total) by mouth every 8 (eight) hours as needed for nausea or vomiting. Start on the third day after carboplatin. 01/20/23   Pasam, Archie Patten, MD  oxyCODONE-acetaminophen (PERCOCET) 10-325 MG tablet Take 1 tablet by mouth 4 (four) times daily as needed for pain. 09/08/19   [provider]  prochlorperazine (COMPAZINE) 10 MG tablet Take 1 tablet (10 mg total) by mouth every 6 (six) hours as needed for nausea or vomiting. 01/20/23   Pasam, Avinash, MD  sertraline  (ZOLOFT) 25 MG tablet Take 1 tablet (25 mg total) by mouth daily. 01/08/23   Ivonne Andrew, NP  VELPHORO 500 MG chewable tablet Taking 500 mg by mouth per meal or snack    [provider]  zolpidem (AMBIEN) 10 MG tablet Take 1 tablet (10 mg total) by mouth at bedtime as needed for sleep. 08/24/19   Maurilio Lovely D, DO    Physical Exam: Vitals:   02/27/23 0635 02/27/23 0745 02/27/23 0934  BP: 132/85 91/65 105/75  Pulse: 88 80 74  Resp: (!) 25 14 (!) 21  Temp: (!) 97.4 F (36.3 C)  97.6 F (36.4 C)  TempSrc: Oral  Oral  SpO2: 99% 100% 99%  Weight: 48.5 kg    Height: 4\' 11"  (1.499 m)     Physical Exam Constitutional:      Comments: Cachectic middle aged female, sitting up in bed, NAD.  HENT:     Head: Normocephalic and atraumatic.     Mouth/Throat:     Mouth: Mucous membranes are dry.     Pharynx: Oropharynx is clear. No oropharyngeal exudate.  Eyes:     General: No scleral icterus. Cardiovascular:     Rate and Rhythm: Normal rate and regular rhythm.     Pulses: Normal pulses.     Heart sounds: Normal heart sounds. No murmur heard.    No friction rub. No gallop.  Pulmonary:     Effort: Pulmonary effort is normal.     Comments: Slightly diminished breath sounds in right lung, no overt crackles, rales or wheezing noted. Saturating >95% on home 2L [place]. Abdominal:     General: Bowel sounds are decreased. There is no distension.     Palpations: Abdomen is soft.     Tenderness: There is abdominal tenderness in the periumbilical area. There is no guarding or rebound.  Hernia: No hernia is present.  Musculoskeletal:        General: No swelling. Normal range of motion.  Skin:    General: Skin is warm and dry.  Neurological:     General: No focal deficit present.     Mental Status: She is alert and oriented to person, place, and time.  Psychiatric:        Mood and Affect: Mood normal.        Behavior: Behavior normal.     Data Reviewed:  There are no new results  to review at this time.  Assessment and Plan: No notes have been filed under this hospital service. Service: Hospitalist  Abdominal pain Constipation Hx of gastroparesis Patient presenting with abdominal pain associated with nausea and vomiting since earlier today.  She has a history of gastroparesis and reports missing her home Reglan doses for the past few days.  She also reports experiencing diarrhea 4 days ago which resolved with Imodium and thereafter she has been experiencing constipation.  She reports not having passed a bowel movement in the past 3 days.  It appears that her abdominal pain is likely related to her gastroparesis given missed Reglan doses.  CT scan did not note any significant stool burden but given her constipation we will also provide laxative therapy to see if this helps relieve her pain.  Despite these more likely etiologies, unable to rule out underlying pancreatic adenocarcinoma as cause of her abdominal pain though CT scan did not show worsening of her pancreatic cancer. -P.o. Reglan 10 mg 3 times daily with meals or IV Reglan 5 mg 3 times daily with meals -f/u EKG for QTc interval -Tylenol every 6 hours as needed for mild pain -Percocet 10-325 mg every 6 hours as needed for moderate or severe pain (home dose, PDMP reviewed) -Dilaudid 0.5 mg IV every 3 hours as needed for severe breakthrough pain -Senna S nightly and MiraLAX daily as needed -Follow-up stool culture -place in observation/telemetry  ESRD on HD -On HD Tuesday, Thursday, Saturday -Due for dialysis today -Nephrology consulted and following, appreciate assistance -Strict I/O's -Continue home Velphoro -Renal diet with fluid restriction  Elevated troponin Initial troponin found to be 20 in the ED.  Ordered EKG for further evaluation.  This is likely type II NSTEMI in the setting of known malignancy and ESRD.  -Trend troponin until peak -Follow-up EKG  Metastatic lung cancer Pancreatic  adenocarcinoma Patient follows Dr. Archie Patten Pasam with oncology.  She has pancreatic cancer and recently diagnosed lung cancer.  Per oncology note, unclear which malignancy is primary.  It does also appear that she has hepatic lesions concerning for metastasis and an MRI brain in December 2024 showed a 1.2 cm enhancing mass in the left insula consistent with metastatic disease to the brain as well.  It appears that the current plan is for chemotherapy with immunotherapy.  She has received 1 cycle of carboplatin and paclitaxel on 02/17/2023 and 1 dose of Keytruda on 02/12/2023.  Her second cycle is tentatively scheduled for February 7. -Continue following oncology in the outpatient setting -Antineoplastic therapy as noted above  Leukocytosis History of pancytopenia No overt signs or symptoms of infection.  She experienced diarrhea 4 days ago which resolved with Imodium.  Thereafter she has been having constipation.  ED provider has ordered a stool culture for further evaluation but does not appear to be gastroenteritis.  She has a history of pancytopenia from known malignancy and antineoplastic therapy.  Unclear etiology of  leukocytosis on labs today but may be related to underlying malignancy/antineoplastic therapy.  She has remained afebrile since arrival. -Trend WBC and fever curve -If febrile, low threshold to start antibiotics  Chronic hypotension Blood pressure ranging in the 90s-100s/70s since arrival to the ED.  She is on chronic midodrine therapy at home for this. -Continue home midodrine 15 mg 3 times daily  Chronic hypoxic respiratory failure History of COPD Hypoxia noted during recent oncology visit.  Likely related to underlying lung cancer.  Started on 2L [place] at home with ambulation.  She is on Advair at home for management of her COPD.  Does not appear to be in an exacerbation currently. -continue supplemental oxygen as needed to maintain O2 sats >88% -Continue Dulera in place of home  Advair  Anemia of end-stage renal disease Her home Mircera is on hold due to malignancy.  Per oncology note, she was recently given 1 unit PRBC on 02/12/2023.  Per oncology, goal is to maintain hemoglobin level closer to 8 as she is currently on chemotherapy.  Hemoglobin level 8.0 on arrival, down trended to 7.4 this morning.  No overt signs of bleeding noted.  Will trend hemoglobin level, if continuing to downtrend will need blood transfusion. -Trend hemoglobin curve -Goal hemoglobin closer to 8.0  Atrial fibrillation/flutter -Continue home Eliquis 2.5 mg twice daily -Continue home amiodarone 100 mg daily  Depression -Continue home Zoloft  GERD -Continue home Pepcid   Advance Care Planning:   Code Status: Full Code   Consults: nephrology  Family Communication: no family at bedside  Severity of Illness: The appropriate patient status for this patient is OBSERVATION. Observation status is judged to be reasonable and necessary in order to provide the required intensity of service to ensure the patient's safety. The patient's presenting symptoms, physical exam findings, and initial radiographic and laboratory data in the context of their medical condition is felt to place them at decreased risk for further clinical deterioration. Furthermore, it is anticipated that the patient will be medically stable for discharge from the hospital within 2 midnights of admission.   Portions of this note were generated with Scientist, clinical (histocompatibility and immunogenetics). Dictation errors may occur despite best attempts at proofreading.   Author: Briscoe Burns, MD 02/27/2023 11:50 AM  For on call review [email or web address].

## 2023-02-27 NOTE — ED Notes (Signed)
Midline not pulling blood back at this time. Patient requesting phlebotomy team to try to obtain bloodwork. Mini lab made aware.

## 2023-02-27 NOTE — Consult Note (Signed)
Renal Service Consult Note Tina Watkins  Tina Watkins 02/27/2023 Tina Krabbe, MD Requesting Physician: Tina Watkins  Reason for Consult: ESRD pt w/ nausea/ vomiting and abd pain HPI: The patient is a 58 y.o. year-old w/ PMH as below who presented to ED c/o N/V and abd pain for about 1 day. H/o gastroparesis and missed her Reglan for 3-4 days. Also had diarrhea but now constipated. Pt has pancreatic and lung cancer (not sure which is primary) and started chemoRx on 02/17/23 and the 2nd cycle is due on 2/07. In ED bp's were soft (chronic issue), HR and RR stable. Started on IV reglan and pain medications percocet/ prn dilaudid, and laxatives. Pt to be admitted. We are asked to see for esrd.   Pt seen in ED. Main c/o's are recent nausea and vomiting as above. No recent HD problems. Using R femoral catheter. Last HD was Thursday. Takes Tina Watkins. Possibly adding or switching to [place] but she hasn't gotten it filled yet.   ROS - denies CP, no joint pain, no HA, no blurry vision, no rash, no diarrhea, no nausea/ vomiting   Past Medical History  Past Medical History:  Diagnosis Date   Anxiety    Bacteremia due to Gram-negative bacteria 05/23/2011   Blind    right eye   Blind right eye    CHF (congestive heart failure) (HCC)    Chronic lower back pain    Complication of anesthesia    "woke up during OR; I have an extremely high tolerance" (12/11/2011) 1 procedure was graft; the other procedures were procedures that are typically done with sedation.   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   Dysrhythmia    "tachycardia" (12/11/2011) new onset afib 10/15/14 EKG   E coli bacteremia 06/18/2011   Elevated LDL cholesterol level 12/2018   ESRD (end stage renal disease) (HCC) 06/12/2011   Tues-Thurs-Sat dialysis   Fibromyalgia    Gastroparesis    GERD (gastroesophageal reflux disease)    Glaucoma    right eye    Gout    H/O carpal tunnel syndrome    Headache 10/2019   no current problem per patient on 11/12/22   Headache(784.0)    "not often anymore" (12/11/2011)   Herpes genitalia 1994   History of blood transfusion    "more than a few times" (12/11/2011)   History of COVID-19, most recently 09/2022 05/09/2019   History of deep vein thrombosis (DVT) of right lower extremity 11/20/2017   History of stomach ulcers    Hypotension    Iron deficiency anemia    New onset a-fib (HCC)    10/15/14 EKG   Osteopenia    Peripheral neuropathy 11/2018   Pneumonia    x several but years ago   Pressure ulcer of sacral region, stage 1 07/2019   Seizures (HCC) 1994   "post transplant; only have had that one" (12/11/2011)   Spinal stenosis in cervical region    Stroke [facility])     left basal ganglia lacunar infarct; Right frontal lobe lacunar infarct.   Stroke [facility]) ~ 1999; 2001   "briefly lost my vision; lost my right eye" (12/11/2011)   Vitamin D deficiency 12/2018   Past Surgical History  Past Surgical History:  Procedure Laterality Date   ANTERIOR CERVICAL DECOMP/DISCECTOMY FUSION N/A 01/08/2015   Procedure: Anterior Cervical Three-Four/Four-Five Decompression/Diskectomy/Fusion;  Surgeon: Hilda Lias, MD;  Location: [facility] NEURO ORS;  Service:  Neurosurgery;  Laterality: N/A;  C3-4 C4-5 Anterior cervical decompression/diskectomy/fusion   APPENDECTOMY  ~ 2004   BIOPSY  09/12/2020   Procedure: BIOPSY;  Surgeon: Rachael Fee, MD;  Location: [facility] ENDOSCOPY;  Service: Endoscopy;;   BRONCHIAL BIOPSY  11/16/2022   Procedure: BRONCHIAL BIOPSIES;  Surgeon: Leslye Peer, MD;  Location: [facility] ENDOSCOPY;  Service: Pulmonary;;   BRONCHIAL BRUSHINGS  11/16/2022   Procedure: BRONCHIAL BRUSHINGS;  Surgeon: Leslye Peer, MD;  Location: [facility] ENDOSCOPY;  Service: Pulmonary;;   BRONCHIAL NEEDLE ASPIRATION BIOPSY  11/16/2022   Procedure: BRONCHIAL NEEDLE ASPIRATION BIOPSIES;  Surgeon: Leslye Peer, MD;  Location: [facility]  ENDOSCOPY;  Service: Pulmonary;;   CATARACT EXTRACTION     right eye   COLONOSCOPY     COLONOSCOPY WITH PROPOFOL N/A 09/12/2020   Procedure: COLONOSCOPY WITH PROPOFOL;  Surgeon: Rachael Fee, MD;  Location: [facility] ENDOSCOPY;  Service: Endoscopy;  Laterality: N/A;   ENUCLEATION  2001   "right"   ESOPHAGOGASTRODUODENOSCOPY (EGD) WITH PROPOFOL N/A 04/21/2012   Procedure: ESOPHAGOGASTRODUODENOSCOPY (EGD) WITH PROPOFOL;  Surgeon: Rachael Fee, MD;  Location: [facility] ENDOSCOPY;  Service: Endoscopy;  Laterality: N/A;   ESOPHAGOGASTRODUODENOSCOPY (EGD) WITH PROPOFOL N/A 09/12/2020   Procedure: ESOPHAGOGASTRODUODENOSCOPY (EGD) WITH PROPOFOL;  Surgeon: Rachael Fee, MD;  Location: [facility] ENDOSCOPY;  Service: Endoscopy;  Laterality: N/A;   FIDUCIAL MARKER PLACEMENT  11/16/2022   Procedure: FIDUCIAL MARKER PLACEMENT;  Surgeon: Leslye Peer, MD;  Location: [facility] ENDOSCOPY;  Service: Pulmonary;;   HEMOSTASIS CONTROL  11/16/2022   Procedure: HEMOSTASIS CONTROL;  Surgeon: Leslye Peer, MD;  Location: [facility] ENDOSCOPY;  Service: Pulmonary;;   INSERTION OF DIALYSIS CATHETER  1988   "AV graft LUA & LFA; LUA worked for 1 day; LFA never worked"   IR FLUORO GUIDE CV LINE RIGHT  07/11/2019   IR FLUORO GUIDE CV LINE RIGHT  10/20/2019   IR FLUORO GUIDE CV LINE RIGHT  05/31/2020   IR PTA VENOUS EXCEPT DIALYSIS CIRCUIT  05/31/2020   IR RADIOLOGY PERIPHERAL GUIDED IV START  07/11/2019   IR US GUIDE VASC ACCESS RIGHT  07/11/2019   IR US GUIDE VASC ACCESS RIGHT  07/11/2019   IR US GUIDE VASC ACCESS RIGHT  07/11/2019   IR VENOCAVAGRAM IVC  05/31/2020   KIDNEY TRANSPLANT  1994; 1999; 2005   "right"   MULTIPLE TOOTH EXTRACTIONS     POLYPECTOMY  09/12/2020   Procedure: POLYPECTOMY;  Surgeon: Rachael Fee, MD;  Location: [facility] ENDOSCOPY;  Service: Endoscopy;;   RIGHT HEART CATH N/A 03/23/2019   Procedure: RIGHT HEART CATH;  Surgeon: Dolores Patty, MD;  Location: [facility] INVASIVE CV LAB;  Service: Cardiovascular;  Laterality: N/A;    TONSILLECTOMY     TOTAL NEPHRECTOMY  1988?; 1994; 2005   VIDEO BRONCHOSCOPY WITH RADIAL ENDOBRONCHIAL ULTRASOUND  11/16/2022   Procedure: VIDEO BRONCHOSCOPY WITH RADIAL ENDOBRONCHIAL ULTRASOUND;  Surgeon: Leslye Peer, MD;  Location: [facility] ENDOSCOPY;  Service: Pulmonary;;   Family History  Family History  Problem Relation Age of Onset   Glaucoma Mother    Pancreatic cancer Father    Multiple sclerosis Brother    Diabetes Maternal Uncle    Hypertension Maternal Grandmother    Breast cancer Neg Hx    Social History  reports that she has quit smoking. Her smoking use included cigarettes. She has a 52.5 pack-year smoking history. She has never been exposed to tobacco smoke. She has never used smokeless tobacco. She reports that she does not currently use  alcohol. She reports current drug use. Frequency: 1.00 time per week. Drug: Marijuana. Allergies  Allergies  Allergen Reactions   Levofloxacin Itching and Rash   Tape Other (See Comments)    "Certain surgical tapes peel off my skin"   Home medications Prior to Admission medications   Medication Sig Start Date End Date Taking? Authorizing Provider  allopurinol (ZYLOPRIM) 100 MG tablet Take 0.5 tablets (50 mg total) by mouth 2 (two) times a week. Patient taking differently: Take 100 mg by mouth daily in the afternoon. 12/24/22  Yes Tawkaliyar, Roya, DO  amiodarone (PACERONE) 200 MG tablet Take 0.5 tablets (100 mg total) by mouth daily. 07/03/22  Yes Eustace Pen, PA-C  apixaban (ELIQUIS) 2.5 MG TABS tablet Take 1 tablet (2.5 mg total) by mouth 2 (two) times daily. Okay to restart this medication on 11/18/2022 11/16/22  Yes Byrum, Les Pou, MD  AURYXIA 1 GM 210 MG(Fe) tablet Take 420 mg by mouth 3 (three) times daily.   Yes [provider]  Colchicine 0.6 MG CAPS Take 0.6 mg by mouth daily as needed (gout flare up). 01/12/22  Yes Ivonne Andrew, NP  cyclobenzaprine (FLEXERIL) 5 MG tablet Take 1 tablet (5 mg total) by mouth at  bedtime as needed. 08/21/22  Yes Rice, Jamesetta Orleans, MD  dexamethasone (DECADRON) 4 MG tablet Take 2 tablets (8mg ) by mouth daily starting the day after carboplatin for 3 days. Take with food 01/20/23  Yes Pasam, Avinash, MD  dexamethasone (DECADRON) 4 MG tablet Take 1 tablet (4 mg total) by mouth 2 (two) times daily with a meal. 02/15/23  Yes Pasam, Avinash, MD  diclofenac Sodium (VOLTAREN) 1 % GEL Apply 4 g topically as needed. 06/22/22  Yes Ivonne Andrew, NP  diphenhydrAMINE (BENADRYL) 25 mg capsule Take 1 capsule (25 mg total) by mouth every 8 (eight) hours as needed. 07/21/19  Yes Kallie Locks, FNP  dorzolamide-timolol (COSOPT) 2-0.5 % ophthalmic solution Place 1 drop into the left eye 2 (two) times daily. 12/13/21  Yes [provider]  famotidine (PEPCID) 20 MG tablet TAKE 1 TABLET BY MOUTH TWICE A DAY 03/10/22  Yes Ivonne Andrew, NP  fluticasone-salmeterol (ADVAIR HFA) 115-21 MCG/ACT inhaler Inhale 2 puffs into the lungs 2 (two) times daily. 04/28/21  Yes Martina Sinner, MD  gabapentin (NEURONTIN) 100 MG capsule Take 100 mg by mouth 3 (three) times daily as needed (nerve pain).   Yes [provider]  metoCLOPramide (REGLAN) 10 MG tablet Take 1 tablet (10 mg total) by mouth 3 (three) times daily before meals. Patient taking differently: Take 10 mg by mouth daily in the afternoon. 12/02/22  Yes Meryl Dare, MD  midodrine (PROAMATINE) 10 MG tablet TAKE 1.5 TABLETS (15 MG TOTAL) BY MOUTH 3 (THREE) TIMES DAILY. NEEDS FOLLOW UP APPOINTMENT FOR ANYMORE REFILLS 01/18/23  Yes Bensimhon, Bevelyn Buckles, MD  omeprazole (PRILOSEC) 40 MG capsule TAKE 1 CAPSULE (40 MG TOTAL) BY MOUTH DAILY. 01/15/23  Yes Meryl Dare, MD  ondansetron (ZOFRAN) 8 MG tablet Take 1 tablet (8 mg total) by mouth every 8 (eight) hours as needed for nausea or vomiting. Start on the third day after carboplatin. 01/20/23  Yes Pasam, Avinash, MD  oxyCODONE-acetaminophen (PERCOCET) 10-325 MG tablet Take 1  tablet by mouth 4 (four) times daily as needed for pain. 09/08/19  Yes [provider]  PRESCRIPTION MEDICATION Take 1 tablet by mouth 2 (two) times daily. Unknown name of medication for brain tumor.   Yes [provider]  sertraline (ZOLOFT) 25 MG tablet Take 1 tablet (25 mg total) by mouth daily. 01/08/23  Yes Ivonne Andrew, NP  Tina Watkins 500 MG chewable tablet Taking 500 mg by mouth per meal or snack   Yes [provider]  zolpidem (AMBIEN) 10 MG tablet Take 1 tablet (10 mg total) by mouth at bedtime as needed for sleep. 08/24/19  Yes Sherryll Burger, Pratik D, DO  heparin sodium, porcine, 1000 UNIT/ML injection Inject 1,000 Units into the peritoneum once. 09/03/22 09/02/23  [provider]     Vitals:   02/27/23 0745 02/27/23 0934 02/27/23 1200 02/27/23 1301  BP: 91/65 105/75 94/66   Pulse: 80 74    Resp: 14 (!) 21 10   Temp:  97.6 F (36.4 C)  97.9 F (36.6 C)  TempSrc:  Oral  Oral  SpO2: 100% 99%    Weight:      Height:       Exam Gen a bit sluggish, but no distress, pleasant Somewhat frail woman on 2L [place] O2 No rash, cyanosis or gangrene Sclera anicteric, throat clear  No jvd or bruits Chest clear on the R, dec'd BS on L RRR no RG Abd soft ntnd no mass or ascites +bs GU defer MS no joint effusions or deformity Ext no LE or UE edema, no other edema Neuro is alert, Ox 3 , nf    R fem TDC intact   Renal-related home meds: - auryxia 420 ac tid - midodrine 100 tid (or 2 /day prn) - Tina Watkins 500 ac tid    OP HD: TTS G-O  4h  400/700   45.8kg  2/2 bath   TDC   Heparin none - getting to dry wt for the most part - last HD 1/23, post wt 46.9kg (+1.1) - rocaltrol 3 mcg po tts    Assessment/ Plan: Abd pain/ nauseas/ vomiting / hx gastroparesis - per pmd, will resume her home reglan, and give laxatives for constipation. Prn meds for pain and nausea.  Lung cancer/ possible pancreatic cancer - per pmd/ oncology ESRD - on HD TTS. Labs, K+ and volume  look okay. Plan short HD (due to high census/ low staffing levels) today or tonight.  Chronic hypotension - cont midodrine 15 tid.  Volume - looks euvolemic on exam, try to stand up pre hd tonight Anemia of esrd - Hb 7- 8 here due to esrd, cancer , other.  Secondary hyperparathyroidism - CCa borderline high, phos pending. Cont binders w/ meals.       Tina Moselle  MD [facility] 02/27/2023, 1:13 PM  Recent Labs  Lab 02/26/23 1543 02/27/23 0728  HGB 8.0* 7.4*  ALBUMIN 3.9 3.2*  CALCIUM 10.3 10.1  CREATININE 4.81* 5.91*  K 3.5 3.2*   Inpatient medications:  amiodarone  100 mg Oral Daily   apixaban  2.5 mg Oral BID   dorzolamide-timolol  1 drop Left Eye BID   famotidine  20 mg Oral BID   metoCLOPramide (REGLAN) injection  5 mg Intravenous TID AC   Or   metoCLOPramide  10 mg Oral TID AC   midodrine  15 mg Oral TID   mometasone-formoterol  2 puff Inhalation BID   senna-docusate  1 tablet Oral QHS   sertraline  25 mg Oral Daily   sucroferric oxyhydroxide  500 mg Oral TID WC    acetaminophen **OR** acetaminophen, gabapentin, HYDROmorphone (DILAUDID) injection, oxyCODONE-acetaminophen, polyethylene glycol

## 2023-02-27 NOTE — ED Provider Notes (Signed)
[place] EMERGENCY DEPARTMENT AT [facility] Provider Note   CSN: [id number] Arrival date & time: 02/27/23  5284     History  Chief Complaint  Patient presents with   Abdominal Pain   Emesis   HPI Tina Watkins is a 58 y.o. female with history of ESRD, CVA, CHF, atrial fibrillation on Eliquis, hypotension, gastroparesis, and recent malignant lung nodule presenting for abdominal pain.  States it started about 12 hours ago.  Located in the upper abdomen and nonradiating.  Feels like a sharp burning pain.  Endorses 3 days of diarrhea and states that she started vomiting this morning around 3 AM.  Output is nonbloody.  She states she does produce some urine but denies any urinary symptoms.  Unsure if she has had a fever.  Intermittently short of breath but denies chest pain.  States she is up-to-date on her dialysis and is due for dialysis today.   Abdominal Pain Associated symptoms: vomiting   Emesis Associated symptoms: abdominal pain        Home Medications Prior to Admission medications   Medication Sig Start Date End Date Taking? Authorizing Provider  allopurinol (ZYLOPRIM) 100 MG tablet Take 0.5 tablets (50 mg total) by mouth 2 (two) times a week. Patient taking differently: Take 100 mg by mouth daily in the afternoon. 12/24/22   Tawkaliyar, Roya, DO  amiodarone (PACERONE) 200 MG tablet Take 0.5 tablets (100 mg total) by mouth daily. 07/03/22   Eustace Pen, PA-C  apixaban (ELIQUIS) 2.5 MG TABS tablet Take 1 tablet (2.5 mg total) by mouth 2 (two) times daily. Okay to restart this medication on 11/18/2022 11/16/22   Leslye Peer, MD  Colchicine 0.6 MG CAPS Take 0.6 mg by mouth daily as needed (gout flare up). 01/12/22   Ivonne Andrew, NP  cyclobenzaprine (FLEXERIL) 5 MG tablet Take 1 tablet (5 mg total) by mouth at bedtime as needed. 08/21/22   Rice, Jamesetta Orleans, MD  dexamethasone (DECADRON) 4 MG tablet Take 2 tablets (8mg ) by mouth daily starting the  day after carboplatin for 3 days. Take with food 01/20/23   Pasam, Avinash, MD  dexamethasone (DECADRON) 4 MG tablet Take 1 tablet (4 mg total) by mouth 2 (two) times daily with a meal. 02/15/23   Pasam, Avinash, MD  diclofenac Sodium (VOLTAREN) 1 % GEL Apply 4 g topically as needed. 06/22/22   Ivonne Andrew, NP  diphenhydrAMINE (BENADRYL) 25 mg capsule Take 1 capsule (25 mg total) by mouth every 8 (eight) hours as needed. 07/21/19   Kallie Locks, FNP  dorzolamide-timolol (COSOPT) 2-0.5 % ophthalmic solution Place 1 drop into the left eye 2 (two) times daily. 12/13/21   [provider]  famotidine (PEPCID) 20 MG tablet TAKE 1 TABLET BY MOUTH TWICE A DAY 03/10/22   Ivonne Andrew, NP  fluticasone-salmeterol (ADVAIR HFA) 115-21 MCG/ACT inhaler Inhale 2 puffs into the lungs 2 (two) times daily. 04/28/21   Martina Sinner, MD  gabapentin (NEURONTIN) 100 MG capsule Take 100 mg by mouth 3 (three) times daily as needed (nerve pain).    [provider]  heparin sodium, porcine, 1000 UNIT/ML injection Inject 1,000 Units into the peritoneum once. 09/03/22 09/02/23  [provider]  lidocaine (LIDODERM) 5 % Place 1 patch onto the skin daily as needed (for pain- remove old patch first).  10/28/18   [provider]  lidocaine-prilocaine (EMLA) cream Apply to affected area once 01/20/23   Pasam, Archie Patten, MD  metoCLOPramide (REGLAN) 10 MG tablet Take 1 tablet (10 mg total) by mouth 3 (three) times daily before meals. Patient taking differently: Take 10 mg by mouth daily in the afternoon. 12/02/22   Meryl Dare, MD  midodrine (PROAMATINE) 10 MG tablet TAKE 1.5 TABLETS (15 MG TOTAL) BY MOUTH 3 (THREE) TIMES DAILY. NEEDS FOLLOW UP APPOINTMENT FOR ANYMORE REFILLS 01/18/23   Bensimhon, Bevelyn Buckles, MD  omeprazole (PRILOSEC) 40 MG capsule TAKE 1 CAPSULE (40 MG TOTAL) BY MOUTH DAILY. 01/15/23   Meryl Dare, MD  ondansetron (ZOFRAN) 8 MG tablet Take 1 tablet (8 mg total) by  mouth every 8 (eight) hours as needed for nausea or vomiting. Start on the third day after carboplatin. 01/20/23   Pasam, Archie Patten, MD  oxyCODONE-acetaminophen (PERCOCET) 10-325 MG tablet Take 1 tablet by mouth 4 (four) times daily as needed for pain. 09/08/19   [provider]  prochlorperazine (COMPAZINE) 10 MG tablet Take 1 tablet (10 mg total) by mouth every 6 (six) hours as needed for nausea or vomiting. 01/20/23   Pasam, Avinash, MD  sertraline (ZOLOFT) 25 MG tablet Take 1 tablet (25 mg total) by mouth daily. 01/08/23   Ivonne Andrew, NP  VELPHORO 500 MG chewable tablet Taking 500 mg by mouth per meal or snack    [provider]  zolpidem (AMBIEN) 10 MG tablet Take 1 tablet (10 mg total) by mouth at bedtime as needed for sleep. 08/24/19   Sherryll Burger, Pratik D, DO      Allergies    Levofloxacin and Tape    Review of Systems   Review of Systems  Gastrointestinal:  Positive for abdominal pain and vomiting.    Physical Exam Updated Vital Signs BP 105/75   Pulse 74   Temp 97.6 F (36.4 C) (Oral)   Resp (!) 21   Ht 4\' 11"  (1.499 m)   Wt 48.5 kg   SpO2 99%   BMI 21.61 kg/m  Physical Exam Vitals and nursing note reviewed.  HENT:     Head: Normocephalic and atraumatic.     Mouth/Throat:     Mouth: Mucous membranes are moist.  Eyes:     General:        Right eye: No discharge.        Left eye: No discharge.     Conjunctiva/sclera: Conjunctivae normal.  Cardiovascular:     Rate and Rhythm: Normal rate and regular rhythm.     Pulses: Normal pulses.     Heart sounds: Normal heart sounds.  Pulmonary:     Effort: Pulmonary effort is normal.     Breath sounds: Normal breath sounds.  Abdominal:     General: Abdomen is flat.     Palpations: Abdomen is soft.     Tenderness: There is abdominal tenderness in the right upper quadrant, epigastric area and left upper quadrant.  Skin:    General: Skin is warm and dry.  Neurological:     General: No focal deficit present.   Psychiatric:        Mood and Affect: Mood normal.     ED Results / Procedures / Treatments   Labs (all labs ordered are listed, but only abnormal results are displayed) Labs Reviewed  CBC WITH DIFFERENTIAL/PLATELET - Abnormal; Notable for the following components:      Result Value   WBC 35.6 (*)    RBC 2.59 (*)    Hemoglobin 7.4 (*)    HCT 23.1 (*)    RDW 17.6 (*)  Platelets 95 (*)    Neutro Abs 25.3 (*)    Monocytes Absolute 2.5 (*)    Abs Immature Granulocytes 6.09 (*)    All other components within normal limits  COMPREHENSIVE METABOLIC PANEL - Abnormal; Notable for the following components:   Sodium 130 (*)    Potassium 3.2 (*)    Chloride 90 (*)    Glucose, Bld 114 (*)    BUN 29 (*)    Creatinine, Ser 5.91 (*)    Total Protein 6.1 (*)    Albumin 3.2 (*)    Alkaline Phosphatase 203 (*)    GFR, Estimated 8 (*)    All other components within normal limits  TROPONIN I (HIGH SENSITIVITY) - Abnormal; Notable for the following components:   Troponin I (High Sensitivity) 20 (*)    All other components within normal limits  STOOL CULTURE  LIPASE, BLOOD  URINALYSIS, ROUTINE W REFLEX MICROSCOPIC  TROPONIN I (HIGH SENSITIVITY)    EKG None  Radiology CT ABDOMEN PELVIS W CONTRAST Result Date: 02/27/2023 CLINICAL DATA:  History of stage IV lung cancer on chemotherapy presenting with upper mid abdominal pain associated with nausea, vomiting, and diarrhea. Pancreatic lesions with biopsy showing adenocarcinoma. * Tracking Code: BO * EXAM: CT ABDOMEN AND PELVIS WITH CONTRAST TECHNIQUE: Multidetector CT imaging of the abdomen and pelvis was performed using the standard protocol following bolus administration of intravenous contrast. RADIATION DOSE REDUCTION: This exam was performed according to the departmental dose-optimization program which includes automated exposure control, adjustment of the mA and/or kV according to patient size and/or use of iterative reconstruction  technique. CONTRAST:  75mL OMNIPAQUE IOHEXOL 350 MG/ML SOLN COMPARISON:  CT abdomen and pelvis dated 02/22/2023 FINDINGS: Lower chest: Innumerable right basilar pulmonary nodules measuring up to 7 x 5 mm in the peripheral right middle lobe (5:11), grossly unchanged compared to 02/22/2023. Diffuse interlobular septal thickening of the right lung base, which appears slightly increased in irregularity nodularity, for example along the major fissure. Similar left pleural effusion with near-complete atelectasis and consolidation of the lingula and left lower lobe. Partially imaged heart size is normal. Extensive intrathoracic collaterals are partially imaged. Hepatobiliary: Multifocal hepatic hypodensities are again seen, a few of which are slightly increased in size, for example 1.6 cm segment 7/8 (3:17), previously 14 mm and 1.3 cm segment 2/3 (3:22), previously 11 mm. Mildly nodular hepatic contour. No intra or extrahepatic biliary ductal dilation. Normal gallbladder. Pancreas: No substantial change in size of solid and cystic mass in the pancreatic tail measuring 4.9 x 2.7 cm (3:31). Spleen: Normal in size without focal abnormality. Adrenals/Urinary Tract: Left adrenal gland is not well seen. Normal right adrenal gland. 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 Transplant right lower quadrant kidney without hydronephrosis or calculi. Decompressed urinary bladder. Stomach/Bowel: Normal appearance of the stomach. No evidence of bowel wall thickening, distention, or inflammatory changes. Appendix is not discretely seen. Vascular/Lymphatic: Right femoral approach vascular catheter tip terminates in the infrarenal IVC. Aortic atherosclerosis. Perirectal/presacral collaterals are again seen. Recanalized paraumbilical vein and subcutaneous collaterals are also present. Unchanged heterogeneous calcification in the right lower quadrant anterior to the external iliac vessels.  No enlarged abdominal or pelvic lymph nodes. Reproductive: No adnexal masses. Other: Trace free fluid.  No free air or fluid collection. Musculoskeletal: No acute or abnormal lytic or blastic osseous lesions. 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 Increased diffuse body wall edema. IMPRESSION: 1. Multifocal hepatic hypodensities, a few of which  are slightly increased in size, suspicious for metastatic disease. 2. Innumerable right basilar pulmonary nodules are grossly unchanged compared to 02/22/2023. Diffuse interlobular septal thickening of the right lung base, which appears slightly increased in irregularity and nodularity, suspicious for lymphangitic carcinomatosis. 3. No substantial change in size of solid and cystic mass in the pancreatic tail measuring 4.9 x 2.7 cm, consistent with known adenocarcinoma. 4. Similar left pleural effusion with near-complete atelectasis and consolidation of the lingula and left lower lobe. 5. Increased diffuse body wall edema. 6. 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 7.  Aortic Atherosclerosis (ICD10-I70.0). Electronically Signed   By: Agustin Cree M.D.   On: 02/27/2023 09:23   DG Chest Port 1 View Result Date: 02/27/2023 CLINICAL DATA:  Hepatocellular carcinoma and lung carcinoma. Shortness of breath. EXAM: PORTABLE CHEST 1 VIEW COMPARISON:  02/03/2023 FINDINGS: Increased near-complete opacification of the left hemithorax is seen without mediastinal shift. This may be due to progressive consolidation and/or enlarging left pleural effusion. Tiny nodular opacities and diffuse interstitial prominence throughout the right lung is unchanged. No right pleural effusion seen. Stable right paratracheal soft tissue prominence, consistent with mass or lymphadenopathy. SVC stent remains in place. IMPRESSION: Increased near-complete opacification of left hemithorax, which may be due to progressive consolidation and/or enlarging left pleural effusion. Stable  tiny nodular opacities and diffuse interstitial prominence throughout the right lung. Stable right paratracheal soft tissue prominence, consistent with mass or lymphadenopathy. Electronically Signed   By: Danae Orleans M.D.   On: 02/27/2023 09:19    Procedures Procedures    Medications Ordered in ED Medications  HYDROmorphone (DILAUDID) injection 1 mg (1 mg Intravenous Given 02/27/23 0655)  metoCLOPramide (REGLAN) injection 5 mg (5 mg Intravenous Given 02/27/23 0656)  iohexol (OMNIPAQUE) 350 MG/ML injection 75 mL (75 mLs Intravenous Contrast Given 02/27/23 0838)  metoCLOPramide (REGLAN) injection 5 mg (5 mg Intravenous Given 02/27/23 0957)  HYDROmorphone (DILAUDID) injection 1 mg (1 mg Intravenous Given 02/27/23 0957)    ED Course/ Medical Decision Making/ A&P Clinical Course as of 02/27/23 1127  Sat Feb 27, 2023  0655 BMI (Calculated): 21.6 [JR]  0816 BMI (Calculated): 21.6 [JR]  0817 BMI (Calculated): 21.6 [JR]    Clinical Course User Index [JR] Gareth Eagle, PA-C                                 Medical Decision Making Amount and/or Complexity of Data Reviewed Labs: ordered. Radiology: ordered.  Risk Prescription drug management.   Initial Impression and Ddx 58 year old well-appearing female presenting for abdominal pain.  Exam notable for upper abdominal tenderness.  DDx includes pancreatitis, acute cholecystitis, ACS, nephrolithiasis, electrolyte derangement, other. Patient PMH that increases complexity of ED encounter:  ESRD, CVA, CHF, atrial fibrillation on Eliquis, hypotension and recent malignant lung nodule   Interpretation of Diagnostics - I independent reviewed and interpreted the labs as followed: Leukocytosis, hyponatremia, anemia, thrombocytopenia  - I independently visualized the following imaging with scope of interpretation limited to determining acute life threatening conditions related to emergency care: CT ab/pelvis, which revealed 1. Multifocal hepatic  hypodensities, a few of which are slightly increased in size, suspicious for metastatic disease. 2. Innumerable right basilar pulmonary nodules are grossly unchanged compared to 02/22/2023. Diffuse interlobular septal thickening of the right lung base, which appears slightly increased in irregularity and nodularity, suspicious for lymphangitic carcinomatosis. 3. No substantial change in size of solid and cystic mass in the  pancreatic tail measuring 4.9 x 2.7 cm, consistent with known adenocarcinoma. 4. Similar left pleural effusion with near-complete atelectasis and consolidation of the lingula and left lower lobe. 5. Increased diffuse body wall edema. 6. 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   Patient Reassessment and Ultimate Disposition/Management On reassessment, patient stated her abdominal pain had improved somewhat but still "hurting a good bit".  CT scan findings were concerning for what appears to be interval worsening of her already known cancer.  Suspect her leukocytosis is related to recent chemotherapy which included WBC stimulant medication (see infusion note from 2 weeks ago).  Workup does not suggest sepsis at this time.  Patient also noted to have a large left pleural effusion redemonstrated on chest x-ray today but does not appear to be any respiratory distress and is stable on her home oxygen 2 L.  Admitted her to the hospital service for abdominal pain and continue management.  Also reach out to Dr. Arlean Hopping of nephrology who will evaluate patient while she is here.  Will defer to his recommendations regarding whether or not she needs dialysis at this time.  Admitted to hospital service with Dr. Austin Miles.  Suspect her abdominal pain is multifactorial but etiology is likely include malignancy versus gastroparesis.  Patient management required discussion with the following services or consulting groups:  Hospitalist Service and Nephrology  Complexity of Problems  Addressed Acute complicated illness or Injury  Additional Data Reviewed and Analyzed Further history obtained from: Further history from spouse/family member, Past medical history and medications listed in the EMR, and Prior ED visit notes  Patient Encounter Risk Assessment Consideration of hospitalization         Final Clinical Impression(s) / ED Diagnoses Final diagnoses:  Abdominal pain, unspecified abdominal location    Rx / DC Orders ED Discharge Orders     None         Gareth Eagle, PA-C 02/27/23 1127    Gwyneth Sprout, MD 03/01/23 1455

## 2023-02-27 NOTE — Progress Notes (Signed)
Patient was taken off the floor to Dialysis unit for her scheduled hemodialysis

## 2023-02-27 NOTE — Progress Notes (Signed)
Patient arrived to 2W21 from the ED on 3L O2 accompanied by her fiance. She was not observed to be in any obvious respiratory distress. She was oriented to the unit and made comfortable in bed with call bell within her reach.

## 2023-02-27 NOTE — Hospital Course (Signed)
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 -known, follows oncology   N/V/D, abdominal pain Hx of gastroparesis -recent chemo   Missed HD - nephro aware

## 2023-02-27 NOTE — ED Notes (Signed)
Lab at bedside to collect ordered bloodwork.

## 2023-02-27 NOTE — ED Triage Notes (Addendum)
Patient via [facility] c/o abdominal pain/n/v/d x 12 hours. Reports upper mid abd - describes as aching - 10/10.   EKG showed SR - Hr 95 BP 104/66.  Sp02 98% - on 2L [place] @ home.  Bgl 109  HD - T/Th/Sat - last HD Thursday -due for dialysis today Pt reports she is currently on chemo for stage 4 lung care.  HD cath in right femoral

## 2023-02-28 ENCOUNTER — Observation Stay (HOSPITAL_COMMUNITY): Payer: Medicare Other

## 2023-02-28 DIAGNOSIS — E876 Hypokalemia: Secondary | ICD-10-CM

## 2023-02-28 DIAGNOSIS — J9611 Chronic respiratory failure with hypoxia: Secondary | ICD-10-CM | POA: Diagnosis present

## 2023-02-28 DIAGNOSIS — I132 Hypertensive heart and chronic kidney disease with heart failure and with stage 5 chronic kidney disease, or end stage renal disease: Secondary | ICD-10-CM | POA: Diagnosis present

## 2023-02-28 DIAGNOSIS — E1142 Type 2 diabetes mellitus with diabetic polyneuropathy: Secondary | ICD-10-CM | POA: Diagnosis present

## 2023-02-28 DIAGNOSIS — K219 Gastro-esophageal reflux disease without esophagitis: Secondary | ICD-10-CM | POA: Diagnosis present

## 2023-02-28 DIAGNOSIS — N186 End stage renal disease: Secondary | ICD-10-CM | POA: Diagnosis present

## 2023-02-28 DIAGNOSIS — I509 Heart failure, unspecified: Secondary | ICD-10-CM | POA: Diagnosis present

## 2023-02-28 DIAGNOSIS — C799 Secondary malignant neoplasm of unspecified site: Secondary | ICD-10-CM | POA: Diagnosis present

## 2023-02-28 DIAGNOSIS — C259 Malignant neoplasm of pancreas, unspecified: Secondary | ICD-10-CM | POA: Diagnosis present

## 2023-02-28 DIAGNOSIS — Z8616 Personal history of COVID-19: Secondary | ICD-10-CM | POA: Diagnosis not present

## 2023-02-28 DIAGNOSIS — Z940 Kidney transplant status: Secondary | ICD-10-CM | POA: Diagnosis not present

## 2023-02-28 DIAGNOSIS — R109 Unspecified abdominal pain: Secondary | ICD-10-CM | POA: Diagnosis present

## 2023-02-28 DIAGNOSIS — D696 Thrombocytopenia, unspecified: Secondary | ICD-10-CM | POA: Diagnosis present

## 2023-02-28 DIAGNOSIS — G894 Chronic pain syndrome: Secondary | ICD-10-CM | POA: Diagnosis present

## 2023-02-28 DIAGNOSIS — J449 Chronic obstructive pulmonary disease, unspecified: Secondary | ICD-10-CM | POA: Diagnosis present

## 2023-02-28 DIAGNOSIS — R112 Nausea with vomiting, unspecified: Secondary | ICD-10-CM | POA: Diagnosis not present

## 2023-02-28 DIAGNOSIS — I4891 Unspecified atrial fibrillation: Secondary | ICD-10-CM | POA: Diagnosis present

## 2023-02-28 DIAGNOSIS — Z992 Dependence on renal dialysis: Secondary | ICD-10-CM | POA: Diagnosis not present

## 2023-02-28 DIAGNOSIS — C3490 Malignant neoplasm of unspecified part of unspecified bronchus or lung: Secondary | ICD-10-CM | POA: Diagnosis present

## 2023-02-28 DIAGNOSIS — I4892 Unspecified atrial flutter: Secondary | ICD-10-CM | POA: Diagnosis present

## 2023-02-28 DIAGNOSIS — E871 Hypo-osmolality and hyponatremia: Secondary | ICD-10-CM | POA: Diagnosis present

## 2023-02-28 DIAGNOSIS — E1143 Type 2 diabetes mellitus with diabetic autonomic (poly)neuropathy: Secondary | ICD-10-CM | POA: Diagnosis present

## 2023-02-28 DIAGNOSIS — F32A Depression, unspecified: Secondary | ICD-10-CM | POA: Diagnosis present

## 2023-02-28 DIAGNOSIS — D631 Anemia in chronic kidney disease: Secondary | ICD-10-CM | POA: Diagnosis present

## 2023-02-28 DIAGNOSIS — N2581 Secondary hyperparathyroidism of renal origin: Secondary | ICD-10-CM | POA: Diagnosis present

## 2023-02-28 DIAGNOSIS — E1122 Type 2 diabetes mellitus with diabetic chronic kidney disease: Secondary | ICD-10-CM | POA: Diagnosis present

## 2023-02-28 LAB — RENAL FUNCTION PANEL
Albumin: 2.9 g/dL — ABNORMAL LOW (ref 3.5–5.0)
Anion gap: 13 (ref 5–15)
BUN: 23 mg/dL — ABNORMAL HIGH (ref 6–20)
CO2: 26 mmol/L (ref 22–32)
Calcium: 9.7 mg/dL (ref 8.9–10.3)
Chloride: 94 mmol/L — ABNORMAL LOW (ref 98–111)
Creatinine, Ser: 4.88 mg/dL — ABNORMAL HIGH (ref 0.44–1.00)
GFR, Estimated: 10 mL/min — ABNORMAL LOW (ref >60)
Glucose, Bld: 82 mg/dL (ref 70–99)
Phosphorus: 3.2 mg/dL (ref 2.5–4.6)
Potassium: 3.1 mmol/L — ABNORMAL LOW (ref 3.5–5.1)
Sodium: 133 mmol/L — ABNORMAL LOW (ref 135–145)

## 2023-02-28 LAB — URINALYSIS, ROUTINE W REFLEX MICROSCOPIC
Bilirubin Urine: NEGATIVE
Glucose, UA: NEGATIVE mg/dL
Hgb urine dipstick: NEGATIVE
Ketones, ur: NEGATIVE mg/dL
Leukocytes,Ua: NEGATIVE
Nitrite: NEGATIVE
Protein, ur: 100 mg/dL — AB
Specific Gravity, Urine: 1.028 (ref 1.005–1.030)
pH: 5 (ref 5.0–8.0)

## 2023-02-28 LAB — PREPARE RBC (CROSSMATCH)

## 2023-02-28 LAB — CBC
HCT: 20.7 % — ABNORMAL LOW (ref 36.0–46.0)
Hemoglobin: 6.8 g/dL — CL (ref 12.0–15.0)
MCH: 29.2 pg (ref 26.0–34.0)
MCHC: 32.9 g/dL (ref 30.0–36.0)
MCV: 88.8 fL (ref 80.0–100.0)
Platelets: 101 10*3/uL — ABNORMAL LOW (ref 150–400)
RBC: 2.33 MIL/uL — ABNORMAL LOW (ref 3.87–5.11)
RDW: 18.1 % — ABNORMAL HIGH (ref 11.5–15.5)
WBC: 39.7 10*3/uL — ABNORMAL HIGH (ref 4.0–10.5)
nRBC: 0.2 % (ref 0.0–0.2)

## 2023-02-28 LAB — HEMOGLOBIN AND HEMATOCRIT, BLOOD
HCT: 28 % — ABNORMAL LOW (ref 36.0–46.0)
Hemoglobin: 9.1 g/dL — ABNORMAL LOW (ref 12.0–15.0)

## 2023-02-28 LAB — HEPATITIS B SURFACE ANTIGEN: Hepatitis B Surface Ag: NONREACTIVE

## 2023-02-28 LAB — PHOSPHORUS: Phosphorus: 3.2 mg/dL (ref 2.5–4.6)

## 2023-02-28 MED ORDER — POTASSIUM CHLORIDE CRYS ER 20 MEQ PO TBCR
40.0000 meq | EXTENDED_RELEASE_TABLET | Freq: Once | ORAL | Status: AC
  Administered 2023-02-28: 40 meq via ORAL
  Filled 2023-02-28: qty 2

## 2023-02-28 MED ORDER — SODIUM CHLORIDE 0.9% IV SOLUTION: Freq: Once | INTRAVENOUS | Status: AC

## 2023-02-28 NOTE — Progress Notes (Addendum)
[facility] night cross cover note:   I was notified by RN of the patient's hemoglobin level of 6.8 noted to yesterday morning's value of 7.4 and compared to 8.02 days ago.  No new symptoms reported at this time.  She has a history of chronic hypotension on midodrine, with most recent blood pressure 95/64 and appearing to be consistent with her baseline.  Most recent heart rates in the 70s.  This is a hemodialysis patient is a, Thursday, Saturday schedule.  She underwent hemodialysis last evening.   I subsequently ordered updated type and screen as well as transfusion of 1 unit PRBC over 3 hours, along with an order for a repeat H&H to be checked after completion of transfusion of this 1 unit PRBC.   Update: blood bank notes that patient previously required irradiated blood.  I have subsequently updated transfusion orders to reflect request for use of irradiated blood and communicated this to the patient's RN.    Newton Pigg, DO Hospitalist

## 2023-02-28 NOTE — Progress Notes (Signed)
Patient brought back to the floor from dialysis

## 2023-02-28 NOTE — Care Management Obs Status (Signed)
MEDICARE OBSERVATION STATUS NOTIFICATION   Patient Details  Name: TRUST CRAGO MRN: [id number] Date of Birth: Jan 18, 1966   Medicare Observation Status Notification Given:  Yes    Lockie Pares, RN 02/28/2023, 9:02 AM

## 2023-02-28 NOTE — Progress Notes (Signed)
[facility] Progress Note   Subjective:   Hgb 6.8 this AM, PRBC pending. She reports some abdominal discomfort as pain meds are wearing off, but improved overall. Reports she wears 2L O2 at home and breathing seems to be at baseline. Denies CP, dizziness.   Objective Vitals:   02/28/23 0238 02/28/23 0437 02/28/23 0737 02/28/23 0833  BP: 96/65 95/64  114/70  Pulse: 78 70 70   Resp: 19 18 18    Temp:  97.8 F (36.6 C)  (!) 97.4 F (36.3 C)  TempSrc:    Oral  SpO2: 100% 98% 98%   Weight:      Height:       Physical Exam General: Alert female seated on bedside, NAD Heart: RRR, no murmurs, rubs or gallops Lungs: CTA bilaterally, respirations unlabored on O2 via [place] Abdomen: Soft, non-distended, +BS Extremities: No edema b/l lower extremities Dialysis Access:  R femoral [facility]  Additional Objective Labs: Basic Metabolic Panel: Recent Labs  Lab 02/26/23 1543 02/27/23 0728 02/28/23 0406  NA 132* 130* 133*  K 3.5 3.2* 3.1*  CL 90* 90* 94*  CO2 28 25 26   GLUCOSE 97 114* 82  BUN 24* 29* 23*  CREATININE 4.81* 5.91* 4.88*  CALCIUM 10.3 10.1 9.7  PHOS  --   --  3.2  3.2   Liver Function Tests: Recent Labs  Lab 02/26/23 1543 02/27/23 0728 02/28/23 0406  AST 17 24  --   ALT 10 15  --   ALKPHOS 213* 203*  --   BILITOT 0.4 0.6  --   PROT 6.6 6.1*  --   ALBUMIN 3.9 3.2* 2.9*   Recent Labs  Lab 02/27/23 0728  LIPASE 30   CBC: Recent Labs  Lab 02/26/23 1543 02/27/23 0728 02/28/23 0406  WBC 23.8* 35.6* 39.7*  NEUTROABS 16.6* 25.3*  --   HGB 8.0* 7.4* 6.8*  HCT 24.7* 23.1* 20.7*  MCV 88.2 89.2 88.8  PLT 100* 95* 101*   Blood Culture    Component Value Date/Time   SDES EXPECTORATED SPUTUM 12/20/2022 1806   SDES EXPECTORATED SPUTUM 12/20/2022 1806   SPECREQUEST NONE 12/20/2022 1806   SPECREQUEST NONE Reflexed from [id number] 12/20/2022 1806   CULT FEW STAPHYLOCOCCUS HAEMOLYTICUS 12/20/2022 1806   REPTSTATUS 12/20/2022 FINAL 12/20/2022 1806    REPTSTATUS 12/23/2022 FINAL 12/20/2022 1806    Cardiac Enzymes: No results for input(s): "CKTOTAL", "CKMB", "CKMBINDEX", "TROPONINI" in the last 168 hours. CBG: No results for input(s): "GLUCAP" in the last 168 hours. Iron Studies: No results for input(s): "IRON", "TIBC", "TRANSFERRIN", "FERRITIN" in the last 72 hours. @lablastinr3 @ Studies/Results: CT ABDOMEN PELVIS W CONTRAST Result Date: 02/27/2023 CLINICAL DATA:  History of stage IV lung cancer on chemotherapy presenting with upper mid abdominal pain associated with nausea, vomiting, and diarrhea. Pancreatic lesions with biopsy showing adenocarcinoma. * Tracking Code: BO * EXAM: CT ABDOMEN AND PELVIS WITH CONTRAST TECHNIQUE: Multidetector CT imaging of the abdomen and pelvis was performed using the standard protocol following bolus administration of intravenous contrast. RADIATION DOSE REDUCTION: This exam was performed according to the departmental dose-optimization program which includes automated exposure control, adjustment of the mA and/or kV according to patient size and/or use of iterative reconstruction technique. CONTRAST:  75mL OMNIPAQUE IOHEXOL 350 MG/ML SOLN COMPARISON:  CT abdomen and pelvis dated 02/22/2023 FINDINGS: Lower chest: Innumerable right basilar pulmonary nodules measuring up to 7 x 5 mm in the peripheral right middle lobe (5:11), grossly unchanged compared to 02/22/2023. Diffuse interlobular septal thickening of the right  lung base, which appears slightly increased in irregularity nodularity, for example along the major fissure. Similar left pleural effusion with near-complete atelectasis and consolidation of the lingula and left lower lobe. Partially imaged heart size is normal. Extensive intrathoracic collaterals are partially imaged. Hepatobiliary: Multifocal hepatic hypodensities are again seen, a few of which are slightly increased in size, for example 1.6 cm segment 7/8 (3:17), previously 14 mm and 1.3 cm segment 2/3  (3:22), previously 11 mm. Mildly nodular hepatic contour. No intra or extrahepatic biliary ductal dilation. Normal gallbladder. Pancreas: No substantial change in size of solid and cystic mass in the pancreatic tail measuring 4.9 x 2.7 cm (3:31). Spleen: Normal in size without focal abnormality. Adrenals/Urinary Tract: Left adrenal gland is not well seen. Normal right adrenal gland. 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 Transplant right lower quadrant kidney without hydronephrosis or calculi. Decompressed urinary bladder. Stomach/Bowel: Normal appearance of the stomach. No evidence of bowel wall thickening, distention, or inflammatory changes. Appendix is not discretely seen. Vascular/Lymphatic: Right femoral approach vascular catheter tip terminates in the infrarenal IVC. Aortic atherosclerosis. Perirectal/presacral collaterals are again seen. Recanalized paraumbilical vein and subcutaneous collaterals are also present. Unchanged heterogeneous calcification in the right lower quadrant anterior to the external iliac vessels. No enlarged abdominal or pelvic lymph nodes. Reproductive: No adnexal masses. Other: Trace free fluid.  No free air or fluid collection. Musculoskeletal: No acute or abnormal lytic or blastic osseous lesions. 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 Increased diffuse body wall edema. IMPRESSION: 1. Multifocal hepatic hypodensities, a few of which are slightly increased in size, suspicious for metastatic disease. 2. Innumerable right basilar pulmonary nodules are grossly unchanged compared to 02/22/2023. Diffuse interlobular septal thickening of the right lung base, which appears slightly increased in irregularity and nodularity, suspicious for lymphangitic carcinomatosis. 3. No substantial change in size of solid and cystic mass in the pancreatic tail measuring 4.9 x 2.7 cm, consistent with known adenocarcinoma. 4. Similar  left pleural effusion with near-complete atelectasis and consolidation of the lingula and left lower lobe. 5. Increased diffuse body wall edema. 6. 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 7.  Aortic Atherosclerosis (ICD10-I70.0). Electronically Signed   By: Agustin Cree M.D.   On: 02/27/2023 09:23   DG Chest Port 1 View Result Date: 02/27/2023 CLINICAL DATA:  Hepatocellular carcinoma and lung carcinoma. Shortness of breath. EXAM: PORTABLE CHEST 1 VIEW COMPARISON:  02/03/2023 FINDINGS: Increased near-complete opacification of the left hemithorax is seen without mediastinal shift. This may be due to progressive consolidation and/or enlarging left pleural effusion. Tiny nodular opacities and diffuse interstitial prominence throughout the right lung is unchanged. No right pleural effusion seen. Stable right paratracheal soft tissue prominence, consistent with mass or lymphadenopathy. SVC stent remains in place. IMPRESSION: Increased near-complete opacification of left hemithorax, which may be due to progressive consolidation and/or enlarging left pleural effusion. Stable tiny nodular opacities and diffuse interstitial prominence throughout the right lung. Stable right paratracheal soft tissue prominence, consistent with mass or lymphadenopathy. Electronically Signed   By: Danae Orleans M.D.   On: 02/27/2023 09:19   Medications:   sodium chloride   Intravenous Once   amiodarone  100 mg Oral Daily   apixaban  2.5 mg Oral BID   Chlorhexidine Gluconate Cloth  6 each Topical Q0600   dorzolamide-timolol  1 drop Left Eye BID   famotidine  20 mg Oral BID   metoCLOPramide (REGLAN) injection  5 mg Intravenous TID AC  Or   metoCLOPramide  10 mg Oral TID AC   midodrine  15 mg Oral TID PC   mometasone-formoterol  2 puff Inhalation BID   senna-docusate  1 tablet Oral QHS   sertraline  25 mg Oral Daily   sodium chloride flush  10-40 mL Intracatheter Q12H   sucroferric oxyhydroxide  500 mg Oral TID WC     Dialysis Orders: TTS G-O  4h  400/700   45.8kg  2/2 bath   TDC   Heparin none - getting to dry wt for the most part - last HD 1/23, post wt 46.9kg (+1.1) - rocaltrol 3 mcg po tts    Assessment/Plan: Abd pain/ nauseas/ vomiting / hx gastroparesis - Management per primary team. Reports symptoms are improving.  Lung cancer/ possible pancreatic cancer - per pmd/ oncology ESRD - on HD TTS. Had HD overnight, K+ on the low side but was checked post HD, hold off on supplementing for now and recheck in AM. Next HD Tuesday Chronic hypotension - cont midodrine 15 tid.  Volume - looks euvolemic on exam, at her EDW and home O2 requirement Anemia of esrd - Hb 6.8. not on ESA due to active cancer treatment. PRBC pending today.  Secondary hyperparathyroidism - Calcium high, phos at goal. Holding VDRA. Continue phos binders.   Rogers Blocker, PA-C 02/28/2023, 9:13 AM  [facility] Pager: [phone]

## 2023-02-28 NOTE — Progress Notes (Signed)
PROGRESS NOTE    Tina Watkins  XLK:[id number] DOB: 10-Jun-1965 DOA: 02/27/2023 PCP: Ivonne Andrew, NP  Outpatient Specialists:     Brief Narrative:  Patient is a 57 year old female, no past medical history significant for ESRD on HD, CHF, atrial fibrillation/flutter, COPD, depression, chronic gout, chronic pain, anemia of chronic renal disease, gastroparesis, midodrine dependent hypotension, history of CVA, pancreatic cancer, and lung cancer.  Patient started chemotherapy about 2 weeks ago.  Patient presented to the ED with abdominal pain, nausea, vomiting.  Patient reported having diarrhea about a week prior to presentation, for which patient was treated with Imodium.  Patient has not had a bowel movement in the last 4 days.  Potassium of 3.1 is noted.  Hemoglobin dropped to 6.8 g/dL today.  No bleeding sources.  As documented above, patient had chemotherapy about 2 weeks ago, and patient is also a dialysis patient.  Patient is currently being transfused 1 unit of packed red blood cells.  02/28/2023: Abdomen with.  For follow-up.  No nausea or vomiting.  Drop in hemoglobin is noted.  Patient is currently being transfused packed red blood cells.  No diarrhea.  No bowel movement in the last 4 days.   Assessment & Plan:   Principal Problem:   Abdominal pain Active Problems:   Pancreatic adenocarcinoma (HCC)  Abdominal pain: Hx of gastroparesis: Nausea and vomiting: -Patient missed doses of Reglan prior to admission. -CT result is noted.  CT scan did not show worsening of her pancreatic cancer. -Nausea and vomiting have resolved. -Potassium is 3.1 noted. -Continue to monitor and replete potassium level. -Abdominal pain has resolved. -Concerns for constipation.  Abdominal x-ray ordered. -Continue laxatives.  Low threshold to consider enema.  Avoid phosphate based enema.  Severe hypokalemia: -Potassium of 3.1. -Cautiously replete potassium. -Continue to monitor renal function and  electrolytes.   ESRD on HD -On HD Tuesday, Thursday, Saturday -Continue hemodialysis TTS. -Nephrology input is appreciated.   Elevated troponin -Initial troponin of 20.  However, repeat troponin was 17.     Metastatic lung cancer Pancreatic adenocarcinoma Patient follows Dr. Archie Patten Pasam with oncology.  She has pancreatic cancer and recently diagnosed lung cancer.  Per oncology note, unclear which malignancy is primary.  It does also appear that she has hepatic lesions concerning for metastasis and an MRI brain in December 2024 showed a 1.2 cm enhancing mass in the left insula consistent with metastatic disease to the brain as well.  It appears that the current plan is for chemotherapy with immunotherapy.  She has received 1 cycle of carboplatin and paclitaxel on 02/17/2023 and 1 dose of Keytruda on 02/12/2023.  Her second cycle is tentatively scheduled for February 7. -Continue following oncology in the outpatient setting -Antineoplastic therapy as noted above   Leukocytosis History of pancytopenia No overt signs or symptoms of infection.  She experienced diarrhea 4 days ago which resolved with Imodium.  Thereafter she has been having constipation.  ED provider has ordered a stool culture for further evaluation but does not appear to be gastroenteritis.  She has a history of pancytopenia from known malignancy and antineoplastic therapy.  Unclear etiology of leukocytosis on labs today but may be related to underlying malignancy/antineoplastic therapy.  She has remained afebrile since arrival. -Trend WBC and fever curve -If febrile, low threshold to start antibiotics   Chronic hypotension Blood pressure ranging in the 90s-100s/70s since arrival to the ED.  She is on chronic midodrine therapy at home for this. -Continue home midodrine  15 mg 3 times daily   Chronic hypoxic respiratory failure History of COPD Hypoxia noted during recent oncology visit.  Likely related to underlying lung  cancer.  Started on 2L [place] at home with ambulation.  She is on Advair at home for management of her COPD.  Does not appear to be in an exacerbation currently. -continue supplemental oxygen as needed to maintain O2 sats >88% -Continue Dulera in place of home Advair   Anemia of end-stage renal disease Her home Mircera is on hold due to malignancy.  Per oncology note, she was recently given 1 unit PRBC on 02/12/2023.  Per oncology, goal is to maintain hemoglobin level closer to 8 as she is currently on chemotherapy.  Hemoglobin level 8.0 on arrival, down trended to 7.4 this morning.  No overt signs of bleeding noted.  Will trend hemoglobin level, if continuing to downtrend will need blood transfusion. -Trend hemoglobin curve -Goal hemoglobin closer to 8.0 02/28/2023: Hemoglobin of 6.8 g/dL noted.  Patient is currently being transfused with 1 unit of packed red blood cells.  Continue to monitor H/H.  Recent history of chemotherapy treatment.   Atrial fibrillation/flutter -Continue home Eliquis 2.5 mg twice daily -Continue home amiodarone 100 mg daily   Depression -Continue home Zoloft   GERD -Continue home Pepcid     DVT prophylaxis: Apixaban 2.5 Mg p.o. twice daily Code Status: Full code Family Communication:  Disposition Plan: Likely discharge back home in the next 1 to 2 days   Consultants:  Nephrology.  Procedures:  Continue hemodialysis  Antimicrobials:  None.   Subjective: No new complaints No abdominal pain No nausea or vomiting Last bowel movement was about 3 to 4 days ago.  Recent history of diarrhea  Objective: Vitals:   02/28/23 0737 02/28/23 0833 02/28/23 1345 02/28/23 1407  BP:  114/70 98/64 104/67  Pulse: 70  70 79  Resp: 18  20 18   Temp:  (!) 97.4 F (36.3 C) 98.4 F (36.9 C) 98.4 F (36.9 C)  TempSrc:  Oral Oral Oral  SpO2: 98%  100% 100%  Weight:      Height:        Intake/Output Summary (Last 24 hours) at 02/28/2023 1512 Last data filed at  02/28/2023 1210 Gross per 24 hour  Intake 240 ml  Output 800 ml  Net -560 ml   Filed Weights   02/27/23 0635  Weight: 48.5 kg    Examination:  General exam: Appears calm and comfortable. Respiratory system: Clear to auscultation.  Cardiovascular system: S1 & S2 heard Gastrointestinal system: Abdomen is soft and nontender. Central nervous system: Awake and alert.  Patient moves all extremities.  Extremities: No leg edema.  Data Reviewed: I have personally reviewed following labs and imaging studies  CBC: Recent Labs  Lab 02/26/23 1543 02/27/23 0728 02/28/23 0406  WBC 23.8* 35.6* 39.7*  NEUTROABS 16.6* 25.3*  --   HGB 8.0* 7.4* 6.8*  HCT 24.7* 23.1* 20.7*  MCV 88.2 89.2 88.8  PLT 100* 95* 101*   Basic Metabolic Panel: Recent Labs  Lab 02/26/23 1543 02/27/23 0728 02/28/23 0406  NA 132* 130* 133*  K 3.5 3.2* 3.1*  CL 90* 90* 94*  CO2 28 25 26   GLUCOSE 97 114* 82  BUN 24* 29* 23*  CREATININE 4.81* 5.91* 4.88*  CALCIUM 10.3 10.1 9.7  MG 1.8  --   --   PHOS  --   --  3.2  3.2   GFR: Estimated Creatinine Clearance: 8.9 mL/min (A) (  by C-G formula based on SCr of 4.88 mg/dL (H)). Liver Function Tests: Recent Labs  Lab 02/26/23 1543 02/27/23 0728 02/28/23 0406  AST 17 24  --   ALT 10 15  --   ALKPHOS 213* 203*  --   BILITOT 0.4 0.6  --   PROT 6.6 6.1*  --   ALBUMIN 3.9 3.2* 2.9*   Recent Labs  Lab 02/27/23 0728  LIPASE 30   No results for input(s): "AMMONIA" in the last 168 hours. Coagulation Profile: No results for input(s): "INR", "PROTIME" in the last 168 hours. Cardiac Enzymes: No results for input(s): "CKTOTAL", "CKMB", "CKMBINDEX", "TROPONINI" in the last 168 hours. BNP (last 3 results) No results for input(s): "PROBNP" in the last 8760 hours. HbA1C: No results for input(s): "HGBA1C" in the last 72 hours. CBG: No results for input(s): "GLUCAP" in the last 168 hours. Lipid Profile: No results for input(s): "CHOL", "HDL", "LDLCALC", "TRIG",  "CHOLHDL", "LDLDIRECT" in the last 72 hours. Thyroid Function Tests: No results for input(s): "TSH", "T4TOTAL", "FREET4", "T3FREE", "THYROIDAB" in the last 72 hours. Anemia Panel: No results for input(s): "VITAMINB12", "FOLATE", "FERRITIN", "TIBC", "IRON", "RETICCTPCT" in the last 72 hours. Urine analysis:    Component Value Date/Time   COLORURINE YELLOW 02/28/2023 0620   APPEARANCEUR HAZY (A) 02/28/2023 0620   LABSPEC 1.028 02/28/2023 0620   PHURINE 5.0 02/28/2023 0620   GLUCOSEU NEGATIVE 02/28/2023 0620   HGBUR NEGATIVE 02/28/2023 0620   BILIRUBINUR NEGATIVE 02/28/2023 0620   BILIRUBINUR Negative 01/04/2019 1030   KETONESUR NEGATIVE 02/28/2023 0620   PROTEINUR 100 (A) 02/28/2023 0620   UROBILINOGEN 0.2 01/04/2019 1030   UROBILINOGEN 0.2 10/15/2014 0946   NITRITE NEGATIVE 02/28/2023 0620   LEUKOCYTESUR NEGATIVE 02/28/2023 0620   Sepsis Labs: @LABRCNTIP (procalcitonin:4,lacticidven:4)  )No results found for this or any previous visit (from the past 240 hours).       Radiology Studies: CT ABDOMEN PELVIS W CONTRAST Result Date: 02/27/2023 CLINICAL DATA:  History of stage IV lung cancer on chemotherapy presenting with upper mid abdominal pain associated with nausea, vomiting, and diarrhea. Pancreatic lesions with biopsy showing adenocarcinoma. * Tracking Code: BO * EXAM: CT ABDOMEN AND PELVIS WITH CONTRAST TECHNIQUE: Multidetector CT imaging of the abdomen and pelvis was performed using the standard protocol following bolus administration of intravenous contrast. RADIATION DOSE REDUCTION: This exam was performed according to the departmental dose-optimization program which includes automated exposure control, adjustment of the mA and/or kV according to patient size and/or use of iterative reconstruction technique. CONTRAST:  75mL OMNIPAQUE IOHEXOL 350 MG/ML SOLN COMPARISON:  CT abdomen and pelvis dated 02/22/2023 FINDINGS: Lower chest: Innumerable right basilar pulmonary nodules  measuring up to 7 x 5 mm in the peripheral right middle lobe (5:11), grossly unchanged compared to 02/22/2023. Diffuse interlobular septal thickening of the right lung base, which appears slightly increased in irregularity nodularity, for example along the major fissure. Similar left pleural effusion with near-complete atelectasis and consolidation of the lingula and left lower lobe. Partially imaged heart size is normal. Extensive intrathoracic collaterals are partially imaged. Hepatobiliary: Multifocal hepatic hypodensities are again seen, a few of which are slightly increased in size, for example 1.6 cm segment 7/8 (3:17), previously 14 mm and 1.3 cm segment 2/3 (3:22), previously 11 mm. Mildly nodular hepatic contour. No intra or extrahepatic biliary ductal dilation. Normal gallbladder. Pancreas: No substantial change in size of solid and cystic mass in the pancreatic tail measuring 4.9 x 2.7 cm (3:31). Spleen: Normal in size without focal abnormality. Adrenals/Urinary Tract:  Left adrenal gland is not well seen. Normal right adrenal gland. 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 Transplant right lower quadrant kidney without hydronephrosis or calculi. Decompressed urinary bladder. Stomach/Bowel: Normal appearance of the stomach. No evidence of bowel wall thickening, distention, or inflammatory changes. Appendix is not discretely seen. Vascular/Lymphatic: Right femoral approach vascular catheter tip terminates in the infrarenal IVC. Aortic atherosclerosis. Perirectal/presacral collaterals are again seen. Recanalized paraumbilical vein and subcutaneous collaterals are also present. Unchanged heterogeneous calcification in the right lower quadrant anterior to the external iliac vessels. No enlarged abdominal or pelvic lymph nodes. Reproductive: No adnexal masses. Other: Trace free fluid.  No free air or fluid collection. Musculoskeletal: No acute or abnormal  lytic or blastic osseous lesions. 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 Increased diffuse body wall edema. IMPRESSION: 1. Multifocal hepatic hypodensities, a few of which are slightly increased in size, suspicious for metastatic disease. 2. Innumerable right basilar pulmonary nodules are grossly unchanged compared to 02/22/2023. Diffuse interlobular septal thickening of the right lung base, which appears slightly increased in irregularity and nodularity, suspicious for lymphangitic carcinomatosis. 3. No substantial change in size of solid and cystic mass in the pancreatic tail measuring 4.9 x 2.7 cm, consistent with known adenocarcinoma. 4. Similar left pleural effusion with near-complete atelectasis and consolidation of the lingula and left lower lobe. 5. Increased diffuse body wall edema. 6. 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 7.  Aortic Atherosclerosis (ICD10-I70.0). Electronically Signed   By: Agustin Cree M.D.   On: 02/27/2023 09:23   DG Chest Port 1 View Result Date: 02/27/2023 CLINICAL DATA:  Hepatocellular carcinoma and lung carcinoma. Shortness of breath. EXAM: PORTABLE CHEST 1 VIEW COMPARISON:  02/03/2023 FINDINGS: Increased near-complete opacification of the left hemithorax is seen without mediastinal shift. This may be due to progressive consolidation and/or enlarging left pleural effusion. Tiny nodular opacities and diffuse interstitial prominence throughout the right lung is unchanged. No right pleural effusion seen. Stable right paratracheal soft tissue prominence, consistent with mass or lymphadenopathy. SVC stent remains in place. IMPRESSION: Increased near-complete opacification of left hemithorax, which may be due to progressive consolidation and/or enlarging left pleural effusion. Stable tiny nodular opacities and diffuse interstitial prominence throughout the right lung. Stable right paratracheal soft tissue prominence, consistent with mass or  lymphadenopathy. Electronically Signed   By: Danae Orleans M.D.   On: 02/27/2023 09:19        Scheduled Meds:  amiodarone  100 mg Oral Daily   apixaban  2.5 mg Oral BID   Chlorhexidine Gluconate Cloth  6 each Topical Q0600   dorzolamide-timolol  1 drop Left Eye BID   famotidine  20 mg Oral BID   metoCLOPramide (REGLAN) injection  5 mg Intravenous TID AC   Or   metoCLOPramide  10 mg Oral TID AC   midodrine  15 mg Oral TID PC   mometasone-formoterol  2 puff Inhalation BID   potassium chloride  40 mEq Oral Once   senna-docusate  1 tablet Oral QHS   sertraline  25 mg Oral Daily   sodium chloride flush  10-40 mL Intracatheter Q12H   sucroferric oxyhydroxide  500 mg Oral TID WC   Continuous Infusions:   LOS: 0 days    Time spent: 35 minutes.    Berton Mount, MD  [facility] Pager #: [phone] 7PM-7AM contact night coverage as above

## 2023-03-01 ENCOUNTER — Inpatient Hospital Stay (HOSPITAL_COMMUNITY): Payer: Medicare Other

## 2023-03-01 ENCOUNTER — Other Ambulatory Visit: Payer: Self-pay | Admitting: *Deleted

## 2023-03-01 ENCOUNTER — Other Ambulatory Visit: Payer: Self-pay

## 2023-03-01 ENCOUNTER — Ambulatory Visit (HOSPITAL_COMMUNITY): Admission: RE | Admit: 2023-03-01 | Payer: Medicare Other | Source: Ambulatory Visit

## 2023-03-01 DIAGNOSIS — C3412 Malignant neoplasm of upper lobe, left bronchus or lung: Secondary | ICD-10-CM

## 2023-03-01 DIAGNOSIS — R112 Nausea with vomiting, unspecified: Secondary | ICD-10-CM | POA: Diagnosis not present

## 2023-03-01 LAB — BPAM RBC
Blood Product Expiration Date: 202502202359
ISSUE DATE / TIME: 202501261340
Unit Type and Rh: 5100

## 2023-03-01 LAB — TYPE AND SCREEN
ABO/RH(D): B POS
Antibody Screen: NEGATIVE
Unit division: 0

## 2023-03-01 MED ORDER — LACTULOSE 10 GM/15ML PO SOLN
20.0000 g | Freq: Once | ORAL | Status: AC
  Administered 2023-03-01: 20 g via ORAL
  Filled 2023-03-01: qty 30

## 2023-03-01 MED ORDER — GADOBUTROL 1 MMOL/ML IV SOLN
5.0000 mL | Freq: Once | INTRAVENOUS | Status: AC | PRN
  Administered 2023-03-01: 5 mL via INTRAVENOUS

## 2023-03-01 MED ORDER — LACTULOSE 20 GM/30ML PO SOLN
20.0000 g | Freq: Two times a day (BID) | ORAL | 2 refills | Status: DC | PRN
Start: 2023-03-01 — End: 2023-04-22

## 2023-03-01 MED ORDER — OXYCODONE-ACETAMINOPHEN 5-325 MG PO TABS
2.0000 | ORAL_TABLET | Freq: Once | ORAL | Status: AC
  Administered 2023-03-01: 2 via ORAL
  Filled 2023-03-01: qty 2

## 2023-03-01 MED ORDER — SENNOSIDES-DOCUSATE SODIUM 8.6-50 MG PO TABS: 1.0000 | ORAL_TABLET | Freq: Every day | ORAL | 0 refills | Status: DC

## 2023-03-01 NOTE — Progress Notes (Signed)
[facility] Patient Discharge Orders- [facility]: garber [name]  Patient's name: Tina Watkins Admit/DC Dates: 02/27/2023 - 03/01/2023  Discharge Diagnoses: Abdominal pain with history of gastroparesis, nausea vomiting    Symptomatic anemia: Multifactorial . ESRD on hemodialysis:  Metastatic lung cancer and pancreatic adenocarcinoma: Followed by oncology.   Aranesp: Given:  NO  HO CA  AS ABV    Date and amount of last dose: 0  Last Hgb: 9.1  PRBC's Given:  1unit   on 02/28/23 ESA dose for discharge: mircera 0 mcg IV q 2 weeks  IV Iron dose at discharge: -  Heparin change: no  EDW Change: no New EDW:   [place] Change: no  Access intervention/Change: no Details:  Hectorol/Calcitriol change: Hold VDRA 2/2 ^ Ca   Discharge Labs: Calcium 9.7 Phosphorus 3.2  Albumin 2.9 K+ 3.1  IV Antibiotics: no Details:  On Coumadin?: no Last INR: Next INR: Managed By:   OTHER/APPTS/LAB ORDERS:    D/C Meds to be reconciled by nurse after every discharge.  Completed By:   Reviewed by: MD:______ RN_______

## 2023-03-01 NOTE — Progress Notes (Signed)
Medication details completed for AVS.

## 2023-03-01 NOTE — Progress Notes (Signed)
[facility] Progress Note   Subjective:  No complaints this morning. Denies dizziness or lightheadedness  Objective Vitals:   03/01/23 0509 03/01/23 0515 03/01/23 0737 03/01/23 0827  BP: (!) 78/61  (!) 89/71   Pulse: 66 62 70   Resp: 14 19 18    Temp: 98.1 F (36.7 C)  98 F (36.7 C)   TempSrc: Oral     SpO2: 100% 100% 94% 93%  Weight:      Height:       Physical Exam General: Alert female seated on bedside, NAD Heart: normal rate, no rub Lungs: Bilateral chest rise, respirations unlabored on O2 via [place] Abdomen: Soft, non-distended, +BS Extremities: No edema b/l lower extremities Dialysis Access:  R femoral [facility]  Additional Objective Labs: Basic Metabolic Panel: Recent Labs  Lab 02/26/23 1543 02/27/23 0728 02/28/23 0406  NA 132* 130* 133*  K 3.5 3.2* 3.1*  CL 90* 90* 94*  CO2 28 25 26   GLUCOSE 97 114* 82  BUN 24* 29* 23*  CREATININE 4.81* 5.91* 4.88*  CALCIUM 10.3 10.1 9.7  PHOS  --   --  3.2  3.2   Liver Function Tests: Recent Labs  Lab 02/26/23 1543 02/27/23 0728 02/28/23 0406  AST 17 24  --   ALT 10 15  --   ALKPHOS 213* 203*  --   BILITOT 0.4 0.6  --   PROT 6.6 6.1*  --   ALBUMIN 3.9 3.2* 2.9*   Recent Labs  Lab 02/27/23 0728  LIPASE 30   CBC: Recent Labs  Lab 02/26/23 1543 02/27/23 0728 02/28/23 0406 02/28/23 1950  WBC 23.8* 35.6* 39.7*  --   NEUTROABS 16.6* 25.3*  --   --   HGB 8.0* 7.4* 6.8* 9.1*  HCT 24.7* 23.1* 20.7* 28.0*  MCV 88.2 89.2 88.8  --   PLT 100* 95* 101*  --    Blood Culture    Component Value Date/Time   SDES EXPECTORATED SPUTUM 12/20/2022 1806   SDES EXPECTORATED SPUTUM 12/20/2022 1806   SPECREQUEST NONE 12/20/2022 1806   SPECREQUEST NONE Reflexed from [id number] 12/20/2022 1806   CULT FEW STAPHYLOCOCCUS HAEMOLYTICUS 12/20/2022 1806   REPTSTATUS 12/20/2022 FINAL 12/20/2022 1806   REPTSTATUS 12/23/2022 FINAL 12/20/2022 1806    Cardiac Enzymes: No results for input(s): "CKTOTAL", "CKMB",  "CKMBINDEX", "TROPONINI" in the last 168 hours. CBG: No results for input(s): "GLUCAP" in the last 168 hours. Iron Studies: No results for input(s): "IRON", "TIBC", "TRANSFERRIN", "FERRITIN" in the last 72 hours. @lablastinr3 @ Studies/Results: DG Abd 1 View Result Date: 02/28/2023 CLINICAL DATA:  Constipation EXAM: ABDOMEN - 1 VIEW COMPARISON:  05/10/2019 FINDINGS: Scattered large and small bowel gas is noted. No obstructive changes are seen. Dialysis catheter is noted via right femoral approach with the tip in the IVC. Multiple surgical changes are noted. No significant retained fecal material is noted. No free air is seen. SVC stent is noted. IMPRESSION: No evidence of constipation or obstructive change. Electronically Signed   By: Alcide Clever M.D.   On: 02/28/2023 19:28   Medications:   amiodarone  100 mg Oral Daily   apixaban  2.5 mg Oral BID   Chlorhexidine Gluconate Cloth  6 each Topical Q0600   dorzolamide-timolol  1 drop Left Eye BID   famotidine  20 mg Oral BID   metoCLOPramide (REGLAN) injection  5 mg Intravenous TID AC   Or   metoCLOPramide  10 mg Oral TID AC   midodrine  15 mg Oral TID PC  mometasone-formoterol  2 puff Inhalation BID   senna-docusate  1 tablet Oral QHS   sertraline  25 mg Oral Daily   sodium chloride flush  10-40 mL Intracatheter Q12H   sucroferric oxyhydroxide  500 mg Oral TID WC    Dialysis Orders: TTS G-O  4h  400/700   45.8kg  2/2 bath   TDC   Heparin none - getting to dry wt for the most part - last HD 1/23, post wt 46.9kg (+1.1) - rocaltrol 3 mcg po tts    Assessment/Plan: Abd pain/ nauseas/ vomiting / hx gastroparesis - Management per primary team. Reports symptoms have improved Lung cancer/ possible pancreatic cancer - per pmd/ oncology ESRD - on HD TTS.  Continue dialysis on schedule Chronic hypotension - cont midodrine 15 tid.  Asymptomatic Chronic hypoxic respiratory failure/volume - looks euvolemic on exam, at her EDW and home O2  requirement Anemia of esrd -significant anemia here.  Not on ESA because of cancer.  Continue transfusions as needed Secondary hyperparathyroidism - Calcium high, phos at goal. Holding VDRA. Continue phos binders.

## 2023-03-01 NOTE — Discharge Summary (Signed)
Physician Discharge Summary  Tina Watkins ZOX:[id number] DOB: [birth year] DOA: 02/27/2023  PCP: Ivonne Andrew, NP  Admit date: 02/27/2023 Discharge date: 03/01/2023  Admitted From: Home Disposition: Home  Recommendations for Outpatient Follow-up:  Follow up with PCP in 1-2 weeks Hemodialysis tomorrow as outpatient Keep up with oncology management plan today including MRI.  Home Health: N/A Equipment/Devices: N/A  Discharge Condition: Stable CODE STATUS: Full code Diet recommendation: Low-salt diet  Discharge summary: 58 year old with extensive medical issues including ESRD on hemodialysis TTS schedule, A-fib flutter, COPD, depression, chronic gout, chronic pain syndrome, anemia of chronic renal disease, gastroparesis, midodrine dependent hypertension, recent stroke, history of pancreatic cancer and lung cancer who received first dose of chemotherapy 2 weeks ago presented to the emergency room with ongoing abdominal pain nausea vomiting.  Patient apparently had chemotherapy, followed by that she had ongoing diarrhea and she had taken a dose of Imodium.  In the emergency room she was found to be intolerance to diet and admitted for symptomatic management.  Her potassium was 3.1.  Hemoglobin dropped to 6.8 without active bleeding.  WBC count remains very high after the Neupogen shot.  Patient was treated symptomatically, nausea medications and pain medications with clinical improvement.  Abdominal pain with history of gastroparesis, nausea vomiting: Treated with around-the-clock metoclopramide.  CT scan did not show any evidence of obstruction.  Repeat KUB without any small bowel obstruction.  Currently tolerating regular diet.  Electrolytes are adequate. Continue regular diet, continue to use as needed Reglan and Zofran.  This was likely exacerbated by chemotherapy.  Use stool softener every day and lactulose to avoid constipation.  Symptomatic anemia: Multifactorial.  Anemia of chronic  disease. 1 unit of PRBC on 02/12/2023 1 unit PRBC on 02/28/2023.  Hemoglobin is 9.  May need ongoing monitoring at cancer center.  ESRD on hemodialysis: Received dialysis Saturday.  Next dialysis tomorrow.  Chronic medical issues including Metastatic lung cancer and pancreatic adenocarcinoma: Followed by oncology. Leukocytosis: Likely secondary to use of Imodium, received Neupogen after chemotherapy.  Does not have evidence of bacterial infection. Chronic hypertension: Patient on midodrine 15 mg 3 times daily. Chronic hypoxemic respiratory failure with history of COPD: Uses oxygen at home. A-fib flutter: Continued Eliquis as he did not have any evidence of active bleeding.  She is also on amiodarone for rate control. Depression: On Zoloft.  Continue. GERD: On Pepcid.  Continue.  Stable to discharge home.  Discharge Diagnoses:  Principal Problem:   Abdominal pain Active Problems:   Pancreatic adenocarcinoma (HCC)   Hypokalemia    Discharge Instructions  Discharge Instructions     Diet - low sodium heart healthy   Complete by: As directed    Increase activity slowly   Complete by: As directed       Allergies as of 03/01/2023       Reactions   Levofloxacin Itching, Rash   Tape Other (See Comments)   "Certain surgical tapes peel off my skin"        Medication List     TAKE these medications    Advair HFA 115-21 MCG/ACT inhaler Generic drug: fluticasone-salmeterol Inhale 2 puffs into the lungs 2 (two) times daily.   allopurinol 100 MG tablet Commonly known as: ZYLOPRIM Take 0.5 tablets (50 mg total) by mouth 2 (two) times a week. What changed:  how much to take when to take this   amiodarone 200 MG tablet Commonly known as: PACERONE Take 0.5 tablets (100 mg total) by mouth daily.  apixaban 2.5 MG Tabs tablet Commonly known as: Eliquis Take 1 tablet (2.5 mg total) by mouth 2 (two) times daily. Okay to restart this medication on 11/18/2022   Auryxia 1 GM  210 MG(Fe) tablet Generic drug: ferric citrate Take 420 mg by mouth 3 (three) times daily.   Colchicine 0.6 MG Caps Take 0.6 mg by mouth daily as needed (gout flare up).   cyclobenzaprine 5 MG tablet Commonly known as: FLEXERIL Take 1 tablet (5 mg total) by mouth at bedtime as needed.   dexamethasone 4 MG tablet Commonly known as: DECADRON Take 2 tablets (8mg ) by mouth daily starting the day after carboplatin for 3 days. Take with food   dexamethasone 4 MG tablet Commonly known as: DECADRON Take 1 tablet (4 mg total) by mouth 2 (two) times daily with a meal.   diclofenac Sodium 1 % Gel Commonly known as: Voltaren Apply 4 g topically as needed.   diphenhydrAMINE 25 mg capsule Commonly known as: BENADRYL Take 1 capsule (25 mg total) by mouth every 8 (eight) hours as needed.   dorzolamide-timolol 2-0.5 % ophthalmic solution Commonly known as: COSOPT Place 1 drop into the left eye 2 (two) times daily.   famotidine 20 MG tablet Commonly known as: PEPCID TAKE 1 TABLET BY MOUTH TWICE A DAY   gabapentin 100 MG capsule Commonly known as: NEURONTIN Take 100 mg by mouth 3 (three) times daily as needed (nerve pain).   heparin sodium (porcine) 1000 UNIT/ML injection Inject 1,000 Units into the peritoneum once.   Lactulose 20 GM/30ML Soln Take 30 mLs (20 g total) by mouth 2 (two) times daily as needed (constipation).   metoCLOPramide 10 MG tablet Commonly known as: Reglan Take 1 tablet (10 mg total) by mouth 3 (three) times daily before meals. What changed: when to take this   midodrine 10 MG tablet Commonly known as: PROAMATINE TAKE 1.5 TABLETS (15 MG TOTAL) BY MOUTH 3 (THREE) TIMES DAILY. NEEDS FOLLOW UP APPOINTMENT FOR ANYMORE REFILLS   omeprazole 40 MG capsule Commonly known as: PRILOSEC TAKE 1 CAPSULE (40 MG TOTAL) BY MOUTH DAILY.   ondansetron 8 MG tablet Commonly known as: Zofran Take 1 tablet (8 mg total) by mouth every 8 (eight) hours as needed for nausea or  vomiting. Start on the third day after carboplatin.   oxyCODONE-acetaminophen 10-325 MG tablet Commonly known as: PERCOCET Take 1 tablet by mouth 4 (four) times daily as needed for pain.   PRESCRIPTION MEDICATION Take 1 tablet by mouth 2 (two) times daily. Unknown name of medication for brain tumor.   senna-docusate 8.6-50 MG tablet Commonly known as: Senokot-S Take 1 tablet by mouth at bedtime.   sertraline 25 MG tablet Commonly known as: ZOLOFT Take 1 tablet (25 mg total) by mouth daily.   Velphoro 500 MG chewable tablet Generic drug: sucroferric oxyhydroxide Taking 500 mg by mouth per meal or snack   zolpidem 10 MG tablet Commonly known as: AMBIEN Take 1 tablet (10 mg total) by mouth at bedtime as needed for sleep.        Allergies  Allergen Reactions   Levofloxacin Itching and Rash   Tape Other (See Comments)    "Certain surgical tapes peel off my skin"    Consultations: Nephrology   Procedures/Studies: DG Abd 1 View Result Date: 02/28/2023 CLINICAL DATA:  Constipation EXAM: ABDOMEN - 1 VIEW COMPARISON:  05/10/2019 FINDINGS: Scattered large and small bowel gas is noted. No obstructive changes are seen. Dialysis catheter is noted via right femoral  approach with the tip in the IVC. Multiple surgical changes are noted. No significant retained fecal material is noted. No free air is seen. SVC stent is noted. IMPRESSION: No evidence of constipation or obstructive change. Electronically Signed   By: Alcide Clever M.D.   On: 02/28/2023 19:28   CT ABDOMEN PELVIS W CONTRAST Result Date: 02/27/2023 CLINICAL DATA:  History of stage IV lung cancer on chemotherapy presenting with upper mid abdominal pain associated with nausea, vomiting, and diarrhea. Pancreatic lesions with biopsy showing adenocarcinoma. * Tracking Code: BO * EXAM: CT ABDOMEN AND PELVIS WITH CONTRAST TECHNIQUE: Multidetector CT imaging of the abdomen and pelvis was performed using the standard protocol following  bolus administration of intravenous contrast. RADIATION DOSE REDUCTION: This exam was performed according to the departmental dose-optimization program which includes automated exposure control, adjustment of the mA and/or kV according to patient size and/or use of iterative reconstruction technique. CONTRAST:  75mL OMNIPAQUE IOHEXOL 350 MG/ML SOLN COMPARISON:  CT abdomen and pelvis dated 02/22/2023 FINDINGS: Lower chest: Innumerable right basilar pulmonary nodules measuring up to 7 x 5 mm in the peripheral right middle lobe (5:11), grossly unchanged compared to 02/22/2023. Diffuse interlobular septal thickening of the right lung base, which appears slightly increased in irregularity nodularity, for example along the major fissure. Similar left pleural effusion with near-complete atelectasis and consolidation of the lingula and left lower lobe. Partially imaged heart size is normal. Extensive intrathoracic collaterals are partially imaged. Hepatobiliary: Multifocal hepatic hypodensities are again seen, a few of which are slightly increased in size, for example 1.6 cm segment 7/8 (3:17), previously 14 mm and 1.3 cm segment 2/3 (3:22), previously 11 mm. Mildly nodular hepatic contour. No intra or extrahepatic biliary ductal dilation. Normal gallbladder. Pancreas: No substantial change in size of solid and cystic mass in the pancreatic tail measuring 4.9 x 2.7 cm (3:31). Spleen: Normal in size without focal abnormality. Adrenals/Urinary Tract: Left adrenal gland is not well seen. Normal right adrenal gland. 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 Transplant right lower quadrant kidney without hydronephrosis or calculi. Decompressed urinary bladder. Stomach/Bowel: Normal appearance of the stomach. No evidence of bowel wall thickening, distention, or inflammatory changes. Appendix is not discretely seen. Vascular/Lymphatic: Right femoral approach vascular catheter tip  terminates in the infrarenal IVC. Aortic atherosclerosis. Perirectal/presacral collaterals are again seen. Recanalized paraumbilical vein and subcutaneous collaterals are also present. Unchanged heterogeneous calcification in the right lower quadrant anterior to the external iliac vessels. No enlarged abdominal or pelvic lymph nodes. Reproductive: No adnexal masses. Other: Trace free fluid.  No free air or fluid collection. Musculoskeletal: No acute or abnormal lytic or blastic osseous lesions. 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 Increased diffuse body wall edema. IMPRESSION: 1. Multifocal hepatic hypodensities, a few of which are slightly increased in size, suspicious for metastatic disease. 2. Innumerable right basilar pulmonary nodules are grossly unchanged compared to 02/22/2023. Diffuse interlobular septal thickening of the right lung base, which appears slightly increased in irregularity and nodularity, suspicious for lymphangitic carcinomatosis. 3. No substantial change in size of solid and cystic mass in the pancreatic tail measuring 4.9 x 2.7 cm, consistent with known adenocarcinoma. 4. Similar left pleural effusion with near-complete atelectasis and consolidation of the lingula and left lower lobe. 5. Increased diffuse body wall edema. 6. 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 7.  Aortic Atherosclerosis (ICD10-I70.0). Electronically Signed   By: Agustin Cree M.D.   On: 02/27/2023 09:23   DG  Chest Port 1 View Result Date: 02/27/2023 CLINICAL DATA:  Hepatocellular carcinoma and lung carcinoma. Shortness of breath. EXAM: PORTABLE CHEST 1 VIEW COMPARISON:  02/03/2023 FINDINGS: Increased near-complete opacification of the left hemithorax is seen without mediastinal shift. This may be due to progressive consolidation and/or enlarging left pleural effusion. Tiny nodular opacities and diffuse interstitial prominence throughout the right lung is unchanged. No right pleural  effusion seen. Stable right paratracheal soft tissue prominence, consistent with mass or lymphadenopathy. SVC stent remains in place. IMPRESSION: Increased near-complete opacification of left hemithorax, which may be due to progressive consolidation and/or enlarging left pleural effusion. Stable tiny nodular opacities and diffuse interstitial prominence throughout the right lung. Stable right paratracheal soft tissue prominence, consistent with mass or lymphadenopathy. Electronically Signed   By: Danae Orleans M.D.   On: 02/27/2023 09:19   CT CHEST ABDOMEN PELVIS W CONTRAST Result Date: 02/22/2023 CLINICAL DATA:  Hepatocellular carcinoma, assessment of treatment response, status post chemotherapy. Lung cancer diagnosed in 2024. Dialysis 3 times a week. Prior nephrectomy. * Tracking Code: BO * EXAM: CT CHEST, ABDOMEN, AND PELVIS WITH CONTRAST TECHNIQUE: Multidetector CT imaging of the chest, abdomen and pelvis was performed following the standard protocol during bolus administration of intravenous contrast. RADIATION DOSE REDUCTION: This exam was performed according to the departmental dose-optimization program which includes automated exposure control, adjustment of the mA and/or kV according to patient size and/or use of iterative reconstruction technique. CONTRAST:  75mL OMNIPAQUE IOHEXOL 300 MG/ML  SOLN COMPARISON:  CTA chest from 02/03/2023. MRI abdomen 11/11/2022. PET-CT 10/19/2022. FINDINGS: CT CHEST FINDINGS Cardiovascular: Reduced caliber left pulmonary artery likely attributable to hypoventilation induced raise a constriction. SVC stent noted. Chronic calcific clot in the left subclavian vein and brachiocephalic vein, with near occluded brachiocephalic vein and extensive venous collaterals through the azygous system, right internal mammary vein, and elsewhere. Coronary, aortic arch, and branch vessel atherosclerotic vascular disease. Mediastinum/Nodes: Right paratracheal node/mass 2.9 cm in short axis,  previously 2.8 cm. Subcarinal adenopathy 2.0 cm in short axis, formerly the same. Lungs/Pleura: Moderate to large left pleural effusion. Consolidated left upper lobe with fiducial adjacent to a 2.8 by 1.9 cm left upper lobe hypodense lesion on image 21 series 3. The vast majority of the left lower lobe is atelectatic. Innumerable pulmonary nodules throughout the right lung are observed, generally under 1 cm in diameter. These are similar to previous. Ground-glass opacities in the right lung and interstitial accentuation in the right lung is improved compared to 02/03/2023, and the right pleural effusion has resolved. An index right upper lobe nodule measures 5 by 6 mm on image 12 series 5, stable. Musculoskeletal: Mild sclerosis, query renal osteodystrophy. CT ABDOMEN PELVIS FINDINGS Hepatobiliary: Scattered hypodense hepatic lesions are observed, not present on 11/11/2022 and similar to. Index right hepatic lobe lesion adjacent to the intrahepatic portion of the IVC measures 1.5 by 1.4 cm on image 45 series 3 (stable from 02/03/2023). There are about 15 lesions. Borderline distended gallbladder. No biliary dilatation. Patent portal vein. Hepatic cirrhosis noted. Pancreas: Mass of the pancreatic tail with cystic elements and possible solid elements, measuring 5.4 by 2.9 by 3.9 cm. Roughly similar appearance to the MRI from 11/11/2022, this could be a postinflammatory lesion, serous cystadenoma, or intraductal papillary mucinous neoplasm. Metastatic disease with cystic elements not excluded. Spleen: Upper normal splenic size. Adrenals/Urinary Tract: Right adrenal gland normal. Left adrenal gland poorly seen. Bilateral native nephrectomies with a atrophic transplant kidney along the right pelvis. Urinary bladder is relatively empty. Stomach/Bowel:  Air-level in the sigmoid colon suggesting diarrheal process. No substantial bowel wall thickening or dilated bowel. Vascular/Lymphatic: Atherosclerosis is present, including  aortoiliac atherosclerotic disease. Right femoral dialysis catheter terminates in the IVC chronic dense calcification along the anterior margin of the right external iliac artery measuring 2.9 by 1.5 cm on image 81 series 3, not certain whether this is a chronically thrombosed saccular aneurysm or new intra-lesion along the right adnexa, versus a chronic calcified lymph node. This was not hypermetabolic on PET-CT of 10/19/2022. Perirectal venous collateral vessels noted Reproductive: Calcified lesion is noted in the vascular section above in the vicinity of the right adnexa. Otherwise unremarkable. Other: Mild subcutaneous edema noted.  No current ascites. Musculoskeletal: Faint sclerosis possibly from renal osteodystrophy. 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 No collapse or fracture. IMPRESSION: 1. Moderate to large left pleural effusion with near complete atelectasis of the left lower lobe. 2. Completely consolidated left upper lobe with fiducial adjacent to a 2.8 by 1.9 cm left upper lobe hypodense lesion. 3. Innumerable pulmonary nodules throughout the right lung, generally under 1 cm in diameter, similar to previous. 4. Mediastinal adenopathy, stable. 5. Scattered hypodense hepatic lesions, not present on 11/11/2022 and stable from 02/03/2023, not entirely specific but favoring metastatic disease. 6. Mass of the pancreatic tail with cystic elements and possible solid elements, roughly similar to the MRI from 11/11/2022, this could be a postinflammatory lesion, serous cystadenoma, metastatic disease, or intraductal papillary mucinous neoplasm. 7. Chronic calcific clot in the left subclavian vein and brachiocephalic vein, with near occluded brachiocephalic vein and extensive venous collaterals through the azygous system, right internal mammary vein, and elsewhere. SVC stent noted. 8. Chronic avascular necrosis of the right femoral head. No collapse or fracture. 9. Hepatic cirrhosis. 10. Air-level in  the sigmoid colon suggesting diarrheal process. 11. Chronic dense calcification along the anterior margin of the right external iliac artery measuring 2.9 by 1.5 cm, not certain whether this is a chronically thrombosed saccular aneurysm or new intra-lesion along the right adnexa, versus a chronic calcified lymph node. This was not hypermetabolic on PET-CT of 10/19/2022. 12. Aortic and extensive systemic atherosclerosis. Aortic Atherosclerosis (ICD10-I70.0). Electronically Signed   By: Gaylyn Rong M.D.   On: 02/22/2023 20:18   IR PICC PLACEMENT LEFT >5 YRS INC IMG GUIDE Result Date: 02/08/2023 INDICATION: PICC PLACEMENT; New lung cancer dx. Needs chemo Briefly, 58 year old female with a history of ESRD on HD via a RIGHT femoral catheter. No central stenoses with SVC stent and significant RIGHT chest wall collaterals. Patient previously recommended for port catheter, however no viable central venous access. Midline catheter recommended after provider discussion. EXAM: Procedures: 1. LEFT UPPER EXTREMITY VENOGRAM 2. ULTRASOUND AND FLUOROSCOPIC GUIDED PICC LINE INSERTION MEDICATIONS: None CONTRAST:  10 mL Omnipaque 300 intravenous contrast ANESTHESIA/SEDATION: Local anesthetic was administered. The patient was continuously monitored during the procedure by the interventional radiology nurse under my direct supervision. FLUOROSCOPY TIME:  Fluoroscopic dose; 3 mGy COMPLICATIONS: None immediate. TECHNIQUE: The procedure, risks, benefits, and alternatives were explained to the patient and/or patient's representative and informed written consent was obtained. A timeout was performed prior to the initiation of the procedure. The LEFT upper extremity was prepped with chlorhexidine in a sterile fashion, and a sterile drape was applied covering the operative field. Maximum barrier sterile technique with sterile gowns and gloves were used for the procedure. A timeout was performed prior to the initiation of the  procedure. Local anesthesia was provided with 1% lidocaine. Under direct ultrasound guidance,  the LEFT basilic vein was accessed with a micropuncture kit after the overlying soft tissues were anesthetized with 1% lidocaine. An ultrasound image was saved for documentation purposes. A LEFT upper extremity venogram was then performed, demonstrating patent basilic vein to the level of the thoracic inlet. A guidewire was advanced to the level of the superior caval-atrial junction for measurement purposes and the central catheter line was cut to length. A peel-away sheath was placed and a 21 cm, 5 Fr, single lumen was inserted to level of the axillary vein. A post procedure spot fluoroscopic was obtained. The catheter easily aspirated and flushed and was sutured in place. A dressing was placed. The patient tolerated the procedure well without immediate post procedural complication. FINDINGS: *LEFT upper extremity venogram with central venous obstruction at the level of the thoracic inlet. Occluded LEFT innominate vein, with collateral venous drainage into the RIGHT atrium via the hemi-azygous veins. *After catheter placement, the midline catheter tip lies within the axillary vein the catheter aspirates and flushes normally and is ready for immediate use. IMPRESSION: 1. 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 2. Successful ultrasound and fluoroscopic guided placement of a LEFT basilic vein-approach, 21 cm, 5 Fr single lumen midline catheter with tip at the axillary vein. The central line is ready for immediate use. Roanna Banning, MD [facility] Radiology Electronically Signed   By: Roanna Banning M.D.   On: 02/08/2023 17:59   CT Angio Chest PE W and/or Wo Contrast Result Date: 02/03/2023 CLINICAL DATA:  Shortness of breath. History of lung cancer. * Tracking Code: BO * EXAM: CT ANGIOGRAPHY CHEST WITH CONTRAST TECHNIQUE: Multidetector CT imaging  of the chest was performed using the standard protocol during bolus administration of intravenous contrast. Multiplanar CT image reconstructions and MIPs were obtained to evaluate the vascular anatomy. RADIATION DOSE REDUCTION: This exam was performed according to the departmental dose-optimization program which includes automated exposure control, adjustment of the mA and/or kV according to patient size and/or use of iterative reconstruction technique. CONTRAST:  75mL OMNIPAQUE IOHEXOL 350 MG/ML SOLN COMPARISON:  12/18/2022 FINDINGS: Cardiovascular: Satisfactory opacification of the pulmonary arteries to the segmental level. No evidence of pulmonary embolism. Web-like filling defect within the distal right main pulmonary artery is unchanged from previous exam and likely represents sequelae of chronic PE. Heart size appears normal. The main pulmonary artery measures 3.8 cm in diameter. Aortic atherosclerosis and coronary artery calcifications. Mediastinum/Nodes: Diffuse decreased AP diameter of the trachea and lower airways concerning for chronic tracheobronchomalacia. 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 There is a stent noted within the distal SVC. Retrograde opacification of the SVC via a dilated azygous vein is identified, image 146/7. Right paratracheal and subcarinal adenopathy identified. Right paratracheal nodal mass measures 2.6 cm, image 118/7. Previously 1.3 cm. Subcarinal lymph node measures 1.8 cm, image 173/7. Unchanged from previous exam. New right hilar adenopathy measures 1.2 cm, image 174/7. Progressive left-sided pre-vascular adenopathy is also identified. Index lymph node measures 1.8 cm, image 143/7. Formally 1.5 cm. Lungs/Pleura: There is complete occlusion of the left upper lobe airway with chronic consolidative change throughout the left upper lobe. Underlying neoplastic process is suspected. Moderate  left effusion is increased from previous exam. There is been progressive atelectasis and volume loss from the left lower lobe. New small right pleural effusion is identified. Extensive pulmonary nodularity throughout the right lung is identified compatible with  metastatic disease. This has progressed when compared with the previous exam. Nodules are increased in multiplicity and size from previous exam. Additionally, there is new marked asymmetric interlobular septal thickening throughout the right lung which may reflect lymphangitic spread of tumor versus pulmonary edema or combination of both. Upper Abdomen: Multiple new low-density foci within the liver are concerning for metastatic disease. Index lesion within anterior dome of liver measures 0.9 cm, image 105/5. Central lobe of liver lesion measures 1.2 cm, image 120/5. No acute findings. Musculoskeletal: No chest wall abnormality. No acute or significant osseous findings. Review of the MIP images confirms the above findings. IMPRESSION: 1. No evidence for acute pulmonary embolus. 2. 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 3. Progressive mediastinal and right hilar adenopathy compatible with progressive metastatic disease. 4. Extensive pulmonary nodularity throughout the right lung is identified compatible with metastatic disease. This has progressed when compared with the previous exam. Nodules are increased in multiplicity and size from previous exam. Additionally, there is new marked asymmetric interlobular septal thickening throughout the right lung which may reflect lymphangitic spread of tumor versus pulmonary edema or combination of both. 5. Moderate left effusion is increased from previous exam. There has been progressive atelectasis and volume loss from the left lower lobe. 6. Unchanged appearance of complete consolidation of the left upper lobe with  chronic occlusion of the left upper lobe airway, likely due to underlying malignancy. 7. New small right pleural effusion. 8. Multiple new low-density foci within the liver are concerning for metastatic disease. 9. Diffuse decreased AP diameter of the trachea and lower airways concerning for chronic tracheobronchomalacia. 10. Coronary artery calcifications. 11.  Aortic Atherosclerosis (ICD10-I70.0). Electronically Signed   By: Signa Kell M.D.   On: 02/03/2023 13:11   DG Chest Portable 1 View Result Date: 02/03/2023 CLINICAL DATA:  Chest pain and shortness of breath. EXAM: PORTABLE CHEST 1 VIEW COMPARISON:  12/20/2022 FINDINGS: Stable cardiomediastinal contours scratch sec cardiomediastinal contours are obscured by pulmonary opacities. SVC stent is again seen. Persistent complete opacification of the left upper lobe. Progressive diffuse nodular interstitial opacities throughout both lungs. Which is concerning for progressive metastatic disease given the appearance of numerous tiny nodules noted on CT from 12/18/2022. IMPRESSION: 1. Persistent complete opacification of the left upper lobe. 2. Progressive diffuse nodular interstitial opacities throughout both lungs concerning for worsening metastatic disease. Electronically Signed   By: Signa Kell M.D.   On: 02/03/2023 10:25   (Echo, Carotid, EGD, Colonoscopy, ERCP)    Subjective: Patient seen in morning rounds.  Husband at the bedside.  Denies any nausea vomiting.  Tolerating regular diet.  She does want some pain medication and was agreeable to use her home dose of Percocet.  Eager to get out right now so that she can keep up her oncology appointment.  She has some kind of MRI/simulation planned today.  Feels comfortable going home.  Husband feels like when she goes home and eats meal she should have bowel movement.  Passing flatus.   Discharge Exam: Vitals:   03/01/23 0737 03/01/23 0827  BP: (!) 89/71   Pulse: 70   Resp: 18   Temp: 98 F  (36.7 C)   SpO2: 94% 93%   Vitals:   03/01/23 0509 03/01/23 0515 03/01/23 0737 03/01/23 0827  BP: (!) 78/61  (!) 89/71   Pulse: 66 62 70   Resp: 14 19 18    Temp: 98.1 F (  36.7 C)  98 F (36.7 C)   TempSrc: Oral     SpO2: 100% 100% 94% 93%  Weight:      Height:        General: Pt is alert, awake, not in acute distress Chronically sick looking.  Frail and debilitated.  On 2 L oxygen. Cardiovascular: RRR, S1/S2 +, no rubs, no gallops Respiratory: CTA bilaterally, some conducted upper airway sounds. Abdominal: Soft, NT, ND, bowel sounds + Extremities: no edema, no cyanosis    The results of significant diagnostics from this hospitalization (including imaging, microbiology, ancillary and laboratory) are listed below for reference.     Microbiology: No results found for this or any previous visit (from the past 240 hours).   Labs: BNP (last 3 results) Recent Labs    02/03/23 1029  BNP 292.1*   Basic Metabolic Panel: Recent Labs  Lab 02/26/23 1543 02/27/23 0728 02/28/23 0406  NA 132* 130* 133*  K 3.5 3.2* 3.1*  CL 90* 90* 94*  CO2 28 25 26   GLUCOSE 97 114* 82  BUN 24* 29* 23*  CREATININE 4.81* 5.91* 4.88*  CALCIUM 10.3 10.1 9.7  MG 1.8  --   --   PHOS  --   --  3.2  3.2   Liver Function Tests: Recent Labs  Lab 02/26/23 1543 02/27/23 0728 02/28/23 0406  AST 17 24  --   ALT 10 15  --   ALKPHOS 213* 203*  --   BILITOT 0.4 0.6  --   PROT 6.6 6.1*  --   ALBUMIN 3.9 3.2* 2.9*   Recent Labs  Lab 02/27/23 0728  LIPASE 30   No results for input(s): "AMMONIA" in the last 168 hours. CBC: Recent Labs  Lab 02/26/23 1543 02/27/23 0728 02/28/23 0406 02/28/23 1950  WBC 23.8* 35.6* 39.7*  --   NEUTROABS 16.6* 25.3*  --   --   HGB 8.0* 7.4* 6.8* 9.1*  HCT 24.7* 23.1* 20.7* 28.0*  MCV 88.2 89.2 88.8  --   PLT 100* 95* 101*  --    Cardiac Enzymes: No results for input(s): "CKTOTAL", "CKMB", "CKMBINDEX", "TROPONINI" in the last 168 hours. BNP: Invalid  input(s): "POCBNP" CBG: No results for input(s): "GLUCAP" in the last 168 hours. D-Dimer No results for input(s): "DDIMER" in the last 72 hours. Hgb A1c No results for input(s): "HGBA1C" in the last 72 hours. Lipid Profile No results for input(s): "CHOL", "HDL", "LDLCALC", "TRIG", "CHOLHDL", "LDLDIRECT" in the last 72 hours. Thyroid function studies No results for input(s): "TSH", "T4TOTAL", "T3FREE", "THYROIDAB" in the last 72 hours.  Invalid input(s): "FREET3" Anemia work up No results for input(s): "VITAMINB12", "FOLATE", "FERRITIN", "TIBC", "IRON", "RETICCTPCT" in the last 72 hours. Urinalysis    Component Value Date/Time   COLORURINE YELLOW 02/28/2023 0620   APPEARANCEUR HAZY (A) 02/28/2023 0620   LABSPEC 1.028 02/28/2023 0620   PHURINE 5.0 02/28/2023 0620   GLUCOSEU NEGATIVE 02/28/2023 0620   HGBUR NEGATIVE 02/28/2023 0620   BILIRUBINUR NEGATIVE 02/28/2023 0620   BILIRUBINUR Negative 01/04/2019 1030   KETONESUR NEGATIVE 02/28/2023 0620   PROTEINUR 100 (A) 02/28/2023 0620   UROBILINOGEN 0.2 01/04/2019 1030   UROBILINOGEN 0.2 10/15/2014 0946   NITRITE NEGATIVE 02/28/2023 0620   LEUKOCYTESUR NEGATIVE 02/28/2023 0620   Sepsis Labs Recent Labs  Lab 02/26/23 1543 02/27/23 0728 02/28/23 0406  WBC 23.8* 35.6* 39.7*   Microbiology No results found for this or any previous visit (from the past 240 hours).   Time coordinating discharge: 99  minutes  SIGNED:   Dorcas Carrow, MD  [facility] 03/01/2023, 11:53 AM

## 2023-03-01 NOTE — Progress Notes (Signed)
In error

## 2023-03-02 ENCOUNTER — Telehealth: Payer: Self-pay

## 2023-03-02 LAB — HEPATITIS B SURFACE ANTIBODY, QUANTITATIVE: Hep B S AB Quant (Post): 44 m[IU]/mL

## 2023-03-02 NOTE — Transitions of Care (Post Inpatient/ED Visit) (Signed)
   03/02/2023  Name: Tina Watkins MRN: [id number] DOB: 06/27/1965  Today's TOC FU Call Status: Today's TOC FU Call Status:: Unsuccessful Call (1st Attempt) Unsuccessful Call (1st Attempt) Date: 03/02/23  Attempted to reach the patient regarding the most recent Inpatient/ED visit.  Follow Up Plan: Additional outreach attempts will be made to reach the patient to complete the Transitions of Care (Post Inpatient/ED visit) call.   Raelin Pixler Daphine Deutscher BSN, Programmer, systems / Transitions of Care [place] [facility], [facility] Direct Dial Number:  [phone]

## 2023-03-02 NOTE — Progress Notes (Incomplete)
ADVANCED HF CLINIC NOTE  Primary Cardiologist: Dr. Flora Lipps  Nephrologist: Dr. Malen Gauze HF Cardiologist: Dr. Gala Romney   HPI: Tina Watkins is a 58 y.o. female with a hx of gout, gastroparesis, CVA, ESRD status post renal transplant, heart failure with preserved ejection fraction, hypertension.  In 12/21, gained a lot of fluid (~30 pounds) over the holidays and was referred to Dr. Flora Lipps. He recommended IV diuresis but she did not want to be admitted. Saw Dr. Arrie Aran who switched to torsemide   Admitted 2/21 for volume overload. Did very well with IV diuresis. Weigh down to 119 pounds. She did bump creatinine to 3.6 so diuretics held for a short time. RHC attempted but could not complete due to access challenges and inability to get into the PA. RA pressure was 3 and CO was normal. There was evidence of multiple venous narrowings in the IVC.   Follow up in AHF clinic 11/21 for pre-op surgical risk stratification for PD catheter. Started back on HD over the summer. BP low at HD frequently and taking midodrine.   Admitted 2//22 for atrial fibrillation with RVR. Sent from HD, resolved with IV diltiazem, but complicated by subsequent hypotension. Repeat echo EF 65-70%, RV ok.  Zio placed in 3/22 for palpitations and NST ordered. Rare PACs and PVCs, < 1% AFL. NST (3/22) no evidence of ischemia, EF > 65%, low risk study.  Returns for HF f/u.  At last visit  she was hving some atypical CP. Myoview 11/23 EF 75% normal perfusion. Says she is feeling good. Tolerating dialysis. Rare sharp CP. Denies dyspnea. BP has been stable. + cough when she lies down. Still smoking 1/3-1/2 ppd    ROS: All systems reviewed and negative except as per HPI.   Cardiac studies: Zio (2/22): mostly NSR, < 1% AFL, rare PACs/PVCs  Echo (2/22): EF 65-70%, RV ok  Zio (1/22): Sinus rhythm - avg HR of 78 bpm, Attrial Flutter occurred (<1% burden), ranging from 113-208 bpm (avg of 148 bpm), the longest lasting  1 hour 16 mins with an avg rate of 139 bpm, rare PACs and PVCs.    RHC 2/21: RHC attempted but could not complete due to  Access challenges and inability to get into the PA. RA pressure was 3 and cardiac output was normal. There was evidence of multiple venous narrowings in the IVC.   Echo 12/20: EF 60-65%, mod increased LVH, Grade III DD, mod elevated PA pressure, RVSP 44.6 mmHg, moderate TR  Echo 8/17: EF 60-65%, PA peak pressure 48 mmHg,  Past Medical History:  Diagnosis Date   Anxiety    Bacteremia due to Gram-negative bacteria 05/23/2011   Blind    right eye   Blind right eye    CHF (congestive heart failure) (HCC)    Chronic lower back pain    Complication of anesthesia    "woke up during OR; I have an extremely high tolerance" (12/11/2011) 1 procedure was graft; the other procedures were procedures that are typically done with sedation.   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   Dysrhythmia    "tachycardia" (12/11/2011) new onset afib 10/15/14 EKG   E coli bacteremia 06/18/2011   Elevated LDL cholesterol level 12/2018   ESRD (end stage renal disease) (HCC) 06/12/2011   Tues-Thurs-Sat dialysis   Fibromyalgia    Gastroparesis    GERD (gastroesophageal reflux disease)    Glaucoma    right eye  Gout    H/O carpal tunnel syndrome    Headache 10/2019   no current problem per patient on 11/12/22   Headache(784.0)    "not often anymore" (12/11/2011)   Herpes genitalia 1994   History of blood transfusion    "more than a few times" (12/11/2011)   History of COVID-19, most recently 09/2022 05/09/2019   History of deep vein thrombosis (DVT) of right lower extremity 11/20/2017   History of stomach ulcers    Hypotension    Iron deficiency anemia    New onset a-fib (HCC)    10/15/14 EKG   Osteopenia    Peripheral neuropathy 11/2018   Pneumonia    x several but years ago   Pressure ulcer of sacral region, stage 1  07/2019   Seizures (HCC) 1994   "post transplant; only have had that one" (12/11/2011)   Spinal stenosis in cervical region    Stroke [facility])     left basal ganglia lacunar infarct; Right frontal lobe lacunar infarct.   Stroke [facility]) ~ 1999; 2001   "briefly lost my vision; lost my right eye" (12/11/2011)   Vitamin D deficiency 12/2018    Current Outpatient Medications  Medication Sig Dispense Refill   allopurinol (ZYLOPRIM) 100 MG tablet Take 0.5 tablets (50 mg total) by mouth 2 (two) times a week. (Patient taking differently: Take 100 mg by mouth daily in the afternoon.) 90 tablet 3   amiodarone (PACERONE) 200 MG tablet Take 0.5 tablets (100 mg total) by mouth daily. 90 tablet 1   apixaban (ELIQUIS) 2.5 MG TABS tablet Take 1 tablet (2.5 mg total) by mouth 2 (two) times daily. Okay to restart this medication on 11/18/2022     AURYXIA 1 GM 210 MG(Fe) tablet Take 420 mg by mouth 3 (three) times daily.     Colchicine 0.6 MG CAPS Take 0.6 mg by mouth daily as needed (gout flare up). 30 capsule 2   cyclobenzaprine (FLEXERIL) 5 MG tablet Take 1 tablet (5 mg total) by mouth at bedtime as needed. 90 tablet 1   dexamethasone (DECADRON) 4 MG tablet Take 2 tablets (8mg ) by mouth daily starting the day after carboplatin for 3 days. Take with food 30 tablet 1   dexamethasone (DECADRON) 4 MG tablet Take 1 tablet (4 mg total) by mouth 2 (two) times daily with a meal. 60 tablet 0   diclofenac Sodium (VOLTAREN) 1 % GEL Apply 4 g topically as needed. 50 g 0   diphenhydrAMINE (BENADRYL) 25 mg capsule Take 1 capsule (25 mg total) by mouth every 8 (eight) hours as needed. 30 capsule 6   dorzolamide-timolol (COSOPT) 2-0.5 % ophthalmic solution Place 1 drop into the left eye 2 (two) times daily.     famotidine (PEPCID) 20 MG tablet TAKE 1 TABLET BY MOUTH TWICE A DAY 180 tablet 3   fluticasone-salmeterol (ADVAIR HFA) 115-21 MCG/ACT inhaler Inhale 2 puffs into the lungs 2 (two) times daily. 1 each 12   gabapentin  (NEURONTIN) 100 MG capsule Take 100 mg by mouth 3 (three) times daily as needed (nerve pain).     heparin sodium, porcine, 1000 UNIT/ML injection Inject 1,000 Units into the peritoneum once.     Lactulose 20 GM/30ML SOLN Take 30 mLs (20 g total) by mouth 2 (two) times daily as needed (constipation). 450 mL 2   metoCLOPramide (REGLAN) 10 MG tablet Take 1 tablet (10 mg total) by mouth 3 (three) times daily before meals. (Patient taking differently: Take 10 mg by mouth  daily in the afternoon.) 90 tablet 2   midodrine (PROAMATINE) 10 MG tablet TAKE 1.5 TABLETS (15 MG TOTAL) BY MOUTH 3 (THREE) TIMES DAILY. NEEDS FOLLOW UP APPOINTMENT FOR ANYMORE REFILLS 405 tablet 0   omeprazole (PRILOSEC) 40 MG capsule TAKE 1 CAPSULE (40 MG TOTAL) BY MOUTH DAILY. 90 capsule 1   ondansetron (ZOFRAN) 8 MG tablet Take 1 tablet (8 mg total) by mouth every 8 (eight) hours as needed for nausea or vomiting. Start on the third day after carboplatin. 30 tablet 1   oxyCODONE-acetaminophen (PERCOCET) 10-325 MG tablet Take 1 tablet by mouth 4 (four) times daily as needed for pain.     PRESCRIPTION MEDICATION Take 1 tablet by mouth 2 (two) times daily. Unknown name of medication for brain tumor.     senna-docusate (SENOKOT-S) 8.6-50 MG tablet Take 1 tablet by mouth at bedtime. 30 tablet 0   sertraline (ZOLOFT) 25 MG tablet Take 1 tablet (25 mg total) by mouth daily. 30 tablet 3   VELPHORO 500 MG chewable tablet Taking 500 mg by mouth per meal or snack     zolpidem (AMBIEN) 10 MG tablet Take 1 tablet (10 mg total) by mouth at bedtime as needed for sleep. 10 tablet 0   No current facility-administered medications for this visit.    Allergies  Allergen Reactions   Levofloxacin Itching and Rash   Tape Other (See Comments)    "Certain surgical tapes peel off my skin"     Social History   Socioeconomic History   Marital status: Single    Spouse name: Not on file   Number of children: 0   Years of education: some college    Highest education level: Some college, no degree  Occupational History   Occupation: disabled    Associate Professor: UNEMPLOYED  Tobacco Use   Smoking status: Former    Current packs/day: 1.50    Average packs/day: 1.5 packs/day for 35.0 years (52.5 ttl pk-yrs)    Types: Cigarettes    Passive exposure: Never   Smokeless tobacco: Never   Tobacco comments:    Pack of cigarettes lasts 2 days 09/18/2020    Patient now only smokes 5 cigarettes per day as of 11/12/22  Vaping Use   Vaping status: Never Used  Substance and Sexual Activity   Alcohol use: Not Currently    Comment: rarely   Drug use: Yes    Frequency: 1.0 times per week    Types: Marijuana    Comment: last use was 11/12/22 - informed to refrain 1-2 days prior procedure   Sexual activity: Not Currently    Partners: Male    Birth control/protection: None  Other Topics Concern   Not on file  Social History Narrative   Right-handed.   Four cups caffeine per day.   Lives alone.   Social Drivers of Corporate investment banker Strain: Low Risk  (06/25/2022)   Overall Financial Resource Strain (CARDIA)    Difficulty of Paying Living Expenses: Not hard at all  Food Insecurity: No Food Insecurity (02/27/2023)   Hunger Vital Sign    Worried About Running Out of Food in the Last Year: Never true    Ran Out of Food in the Last Year: Never true  Transportation Needs: No Transportation Needs (02/27/2023)   PRAPARE - Administrator, Civil Service (Medical): No    Lack of Transportation (Non-Medical): No  Physical Activity: Inactive (06/25/2022)   Exercise Vital Sign    Days of Exercise  per Week: 0 days    Minutes of Exercise per Session: 0 min  Stress: Stress Concern Present (06/25/2022)   [employer] of Occupational Health - Occupational Stress Questionnaire    Feeling of Stress : Very much  Social Connections: Socially Isolated (06/25/2022)   Social Connection and Isolation Panel [NHANES]    Frequency of Communication  with Friends and Family: More than three times a week    Frequency of Social Gatherings with Friends and Family: Once a week    Attends Religious Services: Never    Database administrator or Organizations: No    Attends Banker Meetings: Never    Marital Status: Never married  Intimate Partner Violence: Not At Risk (02/27/2023)   Humiliation, Afraid, Rape, and Kick questionnaire    Fear of Current or Ex-Partner: No    Emotionally Abused: No    Physically Abused: No    Sexually Abused: No   Family History  Problem Relation Age of Onset   Glaucoma Mother    Pancreatic cancer Father    Multiple sclerosis Brother    Diabetes Maternal Uncle    Hypertension Maternal Grandmother    Breast cancer Neg Hx    There were no vitals taken for this visit.  Wt Readings from Last 3 Encounters:  02/27/23 48.5 kg (107 lb)  02/26/23 48.5 kg (107 lb)  02/24/23 47.9 kg (105 lb 9.6 oz)   PHYSICAL EXAM: General:  Weak appearing. No resp difficulty HEENT: R eye enucleation Neck: supple. no JVD. Carotids 2+ bilat; no bruits. No lymphadenopathy or thryomegaly appreciated. Cor: PMI nondisplaced. Regular rate & rhythm. 2/6 TR Lungs: clear Abdomen: soft, nontender, nondistended. No hepatosplenomegaly. No bruits or masses. Good bowel sounds. Extremities: no cyanosis, clubbing, rash, tr-1+ woody edema Neuro: alert & orientedx3, cranial nerves grossly intact. moves all 4 extremities w/o difficulty. Affect pleasant   ECG (personally reviewed): NSR 71 bpm 1AVB 212 ms  No ST-T wave abnormalities. Personally reviewed   ASSESSMENT & PLAN:  1. Chest pressure and orthopnea - Myoview 11/23 EF 75% normal perfusion. - Still with some atypical CP but does not sound ischemic in nature - Continue RF management   2. 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 Grade 3 DD - Echo (2/22): EF 65-70%, RV normal. - Stable NYHA II-early III,  functional status difficult due to ESRD and deconditioning.  - REDS 35% today - Volume per HD.  - Continue midodrine.  3. Atrial fibrillation, paroxysmal - Zio patch (2/22) with <1% atrial flutter burden, some PVC/PACs, mostly NSR. - Lexiscan (3/22) showed no evidence of ischemia, EF > 65%, low risk study. - ChadsVasc Score 6.  - Continue Eliquis 2.5 mg bid (weight < 60 kg & ESRD). No bleeding issues. - Continue amiodarone 200 mg daily. - Followed by AF clinic. - In NSR today  4. Hypotension - Continue midodrine 15 mg tid.  5. Tobacco use - Continues to smoke  - Encouraged her to quit   6. ESRD - s/p previous failed renal transplant. - She is T/TH/Sat HD.  Will follow with General Cardiology. Can return to HF Clinic as needed.  Anderson Malta Lanett, FNP  03/02/23 9:40 AM

## 2023-03-02 NOTE — Telephone Encounter (Signed)
Alfonzo Feller, RN responded to VM from patient and left VM for her to call Marjean Donna, RN back.

## 2023-03-03 ENCOUNTER — Encounter (HOSPITAL_COMMUNITY): Payer: Medicare Other

## 2023-03-03 ENCOUNTER — Telehealth: Payer: Self-pay

## 2023-03-03 ENCOUNTER — Ambulatory Visit
Admission: RE | Admit: 2023-03-03 | Discharge: 2023-03-03 | Disposition: A | Discharge status: Home / Self Care | Payer: Medicare Other | Source: Ambulatory Visit | Attending: Radiation Oncology | Admitting: Radiation Oncology

## 2023-03-03 ENCOUNTER — Encounter (HOSPITAL_COMMUNITY): Payer: Self-pay | Admitting: Family Medicine

## 2023-03-03 DIAGNOSIS — C7931 Secondary malignant neoplasm of brain: Secondary | ICD-10-CM | POA: Diagnosis present

## 2023-03-03 DIAGNOSIS — Z510 Encounter for antineoplastic radiation therapy: Secondary | ICD-10-CM | POA: Insufficient documentation

## 2023-03-03 DIAGNOSIS — C3411 Malignant neoplasm of upper lobe, right bronchus or lung: Secondary | ICD-10-CM | POA: Insufficient documentation

## 2023-03-03 NOTE — Progress Notes (Signed)
At the end of the patient's CT simulation she noted her oxygen tank was empty and she did not have a extra.  She refused to stay here while her significant other went to obtain another tank.  She left the department via wheelchair without difficulty with her significant other.  Lind Covert RN, BSN

## 2023-03-03 NOTE — Transitions of Care (Post Inpatient/ED Visit) (Deleted)
   03/03/2023  Name: Tina Watkins MRN: [id number] DOB: Sep 13, 1965  {[id number]}

## 2023-03-03 NOTE — Transitions of Care (Post Inpatient/ED Visit) (Signed)
   03/03/2023  Name: Tina Watkins MRN: [id number] DOB: [birth year]  Today's TOC FU Call Status: Today's TOC FU Call Status:: Unsuccessful Call (2nd Attempt) Unsuccessful Call (2nd Attempt) Date: 03/03/23  Attempted to reach the patient regarding the most recent Inpatient/ED visit.  Follow Up Plan: Additional outreach attempts will be made to reach the patient to complete the Transitions of Care (Post Inpatient/ED visit) call.   Irelynd Zumstein Daphine Deutscher BSN, Programmer, systems / Transitions of Care [place] / [facility], [facility] Direct Dial Number:  [phone]

## 2023-03-04 ENCOUNTER — Ambulatory Visit: Payer: Medicare Other | Admitting: Oncology

## 2023-03-04 ENCOUNTER — Telehealth: Payer: Self-pay

## 2023-03-04 NOTE — Transitions of Care (Post Inpatient/ED Visit) (Signed)
   03/04/2023  Name: GENEVIEVE ARBAUGH MRN: [id number] DOB: Jun 01, 1965  Today's TOC FU Call Status: Today's TOC FU Call Status:: Unsuccessful Call (3rd Attempt) Unsuccessful Call (3rd Attempt) Date: 03/04/23  Attempted to reach the patient regarding the most recent Inpatient/ED visit.  Follow Up Plan: No further outreach attempts will be made at this time. We have been unable to contact the patient.  Wyline Mood BSN, Programmer, systems   Transitions of Care  [place] / [facility], [facility] Direct Dial Number: [phone]  Fax: [phone]

## 2023-03-05 ENCOUNTER — Ambulatory Visit: Payer: Medicare Other

## 2023-03-08 ENCOUNTER — Ambulatory Visit
Admission: RE | Admit: 2023-03-08 | Discharge: 2023-03-08 | Disposition: A | Discharge status: Home / Self Care | Payer: Medicare Other | Source: Ambulatory Visit | Attending: Radiation Oncology | Admitting: Radiation Oncology

## 2023-03-08 ENCOUNTER — Ambulatory Visit: Payer: Medicare Other

## 2023-03-08 DIAGNOSIS — C3411 Malignant neoplasm of upper lobe, right bronchus or lung: Secondary | ICD-10-CM | POA: Diagnosis present

## 2023-03-08 DIAGNOSIS — C7931 Secondary malignant neoplasm of brain: Secondary | ICD-10-CM | POA: Diagnosis present

## 2023-03-08 DIAGNOSIS — Z510 Encounter for antineoplastic radiation therapy: Secondary | ICD-10-CM | POA: Diagnosis present

## 2023-03-08 NOTE — Telephone Encounter (Unsigned)
Copied from CRM [id number]. Topic: Clinical - Medical Advice >> Mar 08, 2023  4:22 PM Tina Watkins wrote: Reason for CRM: Patient needs a prescription for a walker with a seat. Can she pick up the prescription today? Callback number [phone]

## 2023-03-09 ENCOUNTER — Other Ambulatory Visit: Payer: Self-pay | Admitting: Radiation Therapy

## 2023-03-09 ENCOUNTER — Telehealth: Payer: Self-pay | Admitting: Oncology

## 2023-03-09 DIAGNOSIS — Z510 Encounter for antineoplastic radiation therapy: Secondary | ICD-10-CM | POA: Diagnosis not present

## 2023-03-09 NOTE — Telephone Encounter (Signed)
 [name]

## 2023-03-10 ENCOUNTER — Telehealth: Payer: Self-pay | Admitting: Nurse Practitioner

## 2023-03-10 ENCOUNTER — Other Ambulatory Visit: Payer: Self-pay

## 2023-03-10 ENCOUNTER — Encounter: Payer: Self-pay | Admitting: Radiation Oncology

## 2023-03-10 DIAGNOSIS — Z510 Encounter for antineoplastic radiation therapy: Secondary | ICD-10-CM | POA: Diagnosis not present

## 2023-03-10 LAB — RAD ONC ARIA SESSION SUMMARY
Course Elapsed Days: 0
Plan Fractions Treated to Date: 1
Plan Prescribed Dose Per Fraction: 20 Gy
Plan Total Fractions Prescribed: 1
Plan Total Prescribed Dose: 20 Gy
Reference Point Dosage Given to Date: 20 Gy
Reference Point Session Dosage Given: 20 Gy
Session Number: 1

## 2023-03-10 NOTE — Progress Notes (Signed)
 Tina Watkins rested with us  for 30 minutes following her SRS treatment.  Patient denies headache, dizziness, nausea, diplopia or ringing in the ears. Denies fatigue. Patient without complaints. Understands to avoid strenuous activity for the next 24 hours and call [phone] with needs.  Wheeled out of the clinic without difficulty with her family.  BP 107/69   Pulse 69   Temp 98.3 F (36.8 C)   Resp 20   SpO2 100%

## 2023-03-10 NOTE — Progress Notes (Signed)
  Radiation Oncology         [phone]) [phone] ________________________________  Name: Tina Watkins  FMW:[id number]  Date of Service: 03/10/23  DOB: May 06, 1965   Steroid Taper Instructions   You currently have a prescription for Dexamethasone  4 mg Tablets.    Beginning 03/12/23: Take 1/2 of a tablet (which is 2 mg) twice a day  Beginning 03/19/23: Take 1/2 of a tablet (which is 2 mg) once a day  Beginning 03/26/23: Take 1/2 of a tablet (which is 2 mg) every other day and stop on 03/31/23.   Please call our office if you have any headaches, visual changes, uncontrolled movements, extremity weakness, nausea or vomiting.

## 2023-03-10 NOTE — Op Note (Signed)
 Name: Tina Watkins    MRN: [id number]   Date: 03/10/2023    DOB: Jul 08, 1965   STEREOTACTIC RADIOSURGERY OPERATIVE NOTE  PRE-OPERATIVE DIAGNOSIS:  Left insular brain metastasis  POST-OPERATIVE DIAGNOSIS:  Left insular brain metastasis  PROCEDURE:  Stereotactic Radiosurgery using TrueBeam Linac device  SURGEON:  Dorn Ned, MD  RADIATION ONCOLOGIST: Norleen Limes, MD  TECHNIQUE:  The patient underwent a radiation treatment planning session in the radiation oncology simulation suite under the care of the radiation oncology physician and physicist.  I participated closely in the radiation treatment planning afterwards. The patient underwent planning CT which was fused to 3T high resolution MRI with 1 mm axial slices.  These images were fused on the planning system.  We contoured the gross target volumes and subsequently expanded this to yield the Planning Target Volume. I actively participated in the planning process.  I helped to define and review the target contours and also the contours of the optic pathway, eyes, brainstem and selected nearby organs at risk.  All the dose constraints for critical structures were reviewed and compared to AAPM Task Group 101.  The prescription dose conformity was reviewed.  I approved the plan electronically.    Accordingly, Tina Watkins  was brought to the TrueBeam stereotactic radiation treatment linac and placed in the custom immobilization mask.  The patient was aligned according to the IR fiducial markers with BrainLab Exactrac, then orthogonal x-rays were used in ExacTrac with the 6DOF robotic table and the shifts were made to align the patient  Tina Watkins received stereotactic radiosurgery to a prescription dose of 20 Gy uneventfully.    The detailed description of the procedure is recorded in the radiation oncology procedure note.  I was present for the duration of the procedure.  DISPOSITION:   Following delivery, the patient was  transported to nursing in stable condition and monitored for possible acute effects to be discharged to home in stable condition with follow-up in one month.  Dorn Ned, MD [facility]

## 2023-03-10 NOTE — Telephone Encounter (Signed)
 Copied from CRM [id number]. Topic: General - Other >> Mar 10, 2023 12:18 PM Felizardo Hotter wrote: Reason for CRM: Patient called stated needs a prescription for a walker with a seat. Can she pick up the prescription today? Callback number [phone]

## 2023-03-11 ENCOUNTER — Other Ambulatory Visit: Payer: Self-pay

## 2023-03-11 ENCOUNTER — Other Ambulatory Visit: Payer: Self-pay | Admitting: Nurse Practitioner

## 2023-03-11 DIAGNOSIS — M25551 Pain in right hip: Secondary | ICD-10-CM

## 2023-03-11 DIAGNOSIS — R112 Nausea with vomiting, unspecified: Secondary | ICD-10-CM

## 2023-03-11 DIAGNOSIS — K3184 Gastroparesis: Secondary | ICD-10-CM

## 2023-03-11 NOTE — Radiation Completion Notes (Addendum)
  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 She was given steroid taper instructions at the conclusion of her treatment.  The patient will receive a call in about one month from the radiation oncology department. She will continue follow up with Dr. Autumn in medical oncology as well as proceed with repeat MRI in 3 months.      Donald KYM Husband, PAC

## 2023-03-12 ENCOUNTER — Inpatient Hospital Stay: Payer: Medicare Other

## 2023-03-12 ENCOUNTER — Inpatient Hospital Stay (HOSPITAL_BASED_OUTPATIENT_CLINIC_OR_DEPARTMENT_OTHER): Payer: Medicare Other | Admitting: Oncology

## 2023-03-12 ENCOUNTER — Inpatient Hospital Stay: Payer: Medicare Other | Attending: Oncology

## 2023-03-12 ENCOUNTER — Telehealth: Payer: Self-pay | Admitting: Nurse Practitioner

## 2023-03-12 VITALS — BP 128/83 | HR 72 | Resp 16

## 2023-03-12 VITALS — BP 94/63 | HR 82 | Temp 98.2°F | Resp 15 | Wt 105.2 lb

## 2023-03-12 DIAGNOSIS — C3412 Malignant neoplasm of upper lobe, left bronchus or lung: Secondary | ICD-10-CM | POA: Insufficient documentation

## 2023-03-12 DIAGNOSIS — Z7951 Long term (current) use of inhaled steroids: Secondary | ICD-10-CM | POA: Insufficient documentation

## 2023-03-12 DIAGNOSIS — Z87891 Personal history of nicotine dependence: Secondary | ICD-10-CM | POA: Diagnosis not present

## 2023-03-12 DIAGNOSIS — K869 Disease of pancreas, unspecified: Secondary | ICD-10-CM | POA: Diagnosis not present

## 2023-03-12 DIAGNOSIS — N186 End stage renal disease: Secondary | ICD-10-CM | POA: Insufficient documentation

## 2023-03-12 DIAGNOSIS — D631 Anemia in chronic kidney disease: Secondary | ICD-10-CM

## 2023-03-12 DIAGNOSIS — Z5111 Encounter for antineoplastic chemotherapy: Secondary | ICD-10-CM | POA: Diagnosis present

## 2023-03-12 DIAGNOSIS — C252 Malignant neoplasm of tail of pancreas: Secondary | ICD-10-CM | POA: Insufficient documentation

## 2023-03-12 DIAGNOSIS — Z79899 Other long term (current) drug therapy: Secondary | ICD-10-CM | POA: Insufficient documentation

## 2023-03-12 DIAGNOSIS — D696 Thrombocytopenia, unspecified: Secondary | ICD-10-CM | POA: Insufficient documentation

## 2023-03-12 DIAGNOSIS — Z7952 Long term (current) use of systemic steroids: Secondary | ICD-10-CM | POA: Insufficient documentation

## 2023-03-12 DIAGNOSIS — Z5112 Encounter for antineoplastic immunotherapy: Secondary | ICD-10-CM | POA: Diagnosis present

## 2023-03-12 DIAGNOSIS — Z905 Acquired absence of kidney: Secondary | ICD-10-CM | POA: Insufficient documentation

## 2023-03-12 DIAGNOSIS — Z7901 Long term (current) use of anticoagulants: Secondary | ICD-10-CM | POA: Diagnosis not present

## 2023-03-12 DIAGNOSIS — Z992 Dependence on renal dialysis: Secondary | ICD-10-CM

## 2023-03-12 DIAGNOSIS — C7931 Secondary malignant neoplasm of brain: Secondary | ICD-10-CM | POA: Insufficient documentation

## 2023-03-12 DIAGNOSIS — Z452 Encounter for adjustment and management of vascular access device: Secondary | ICD-10-CM

## 2023-03-12 DIAGNOSIS — C787 Secondary malignant neoplasm of liver and intrahepatic bile duct: Secondary | ICD-10-CM | POA: Insufficient documentation

## 2023-03-12 LAB — CBC WITH DIFFERENTIAL (CANCER CENTER ONLY)
Abs Immature Granulocytes: 0.21 10*3/uL — ABNORMAL HIGH (ref 0.00–0.07)
Basophils Absolute: 0 10*3/uL (ref 0.0–0.1)
Basophils Relative: 0 %
Eosinophils Absolute: 0.1 10*3/uL (ref 0.0–0.5)
Eosinophils Relative: 0 %
HCT: 27.5 % — ABNORMAL LOW (ref 36.0–46.0)
Hemoglobin: 8.6 g/dL — ABNORMAL LOW (ref 12.0–15.0)
Immature Granulocytes: 1 %
Lymphocytes Relative: 4 %
Lymphs Abs: 0.7 10*3/uL (ref 0.7–4.0)
MCH: 28.8 pg (ref 26.0–34.0)
MCHC: 31.3 g/dL (ref 30.0–36.0)
MCV: 92 fL (ref 80.0–100.0)
Monocytes Absolute: 0.3 10*3/uL (ref 0.1–1.0)
Monocytes Relative: 1 %
Neutro Abs: 19 10*3/uL — ABNORMAL HIGH (ref 1.7–7.7)
Neutrophils Relative %: 94 %
Platelet Count: 198 10*3/uL (ref 150–400)
RBC: 2.99 MIL/uL — ABNORMAL LOW (ref 3.87–5.11)
RDW: 17.2 % — ABNORMAL HIGH (ref 11.5–15.5)
WBC Count: 20.3 10*3/uL — ABNORMAL HIGH (ref 4.0–10.5)
nRBC: 0 % (ref 0.0–0.2)

## 2023-03-12 LAB — CMP (CANCER CENTER ONLY)
ALT: 27 U/L (ref 0–44)
AST: 21 U/L (ref 15–41)
Albumin: 4 g/dL (ref 3.5–5.0)
Alkaline Phosphatase: 248 U/L — ABNORMAL HIGH (ref 38–126)
Anion gap: 12 (ref 5–15)
BUN: 38 mg/dL — ABNORMAL HIGH (ref 6–20)
CO2: 29 mmol/L (ref 22–32)
Calcium: 9.4 mg/dL (ref 8.9–10.3)
Chloride: 97 mmol/L — ABNORMAL LOW (ref 98–111)
Creatinine: 4.69 mg/dL — ABNORMAL HIGH (ref 0.44–1.00)
GFR, Estimated: 10 mL/min — ABNORMAL LOW (ref >60)
Glucose, Bld: 120 mg/dL — ABNORMAL HIGH (ref 70–99)
Potassium: 4 mmol/L (ref 3.5–5.1)
Sodium: 138 mmol/L (ref 135–145)
Total Bilirubin: 0.4 mg/dL (ref 0.0–1.2)
Total Protein: 6.5 g/dL (ref 6.5–8.1)

## 2023-03-12 LAB — TSH: TSH: 3.396 u[IU]/mL (ref 0.350–4.500)

## 2023-03-12 MED ORDER — FOSAPREPITANT DIMEGLUMINE INJECTION 150 MG
150.0000 mg | Freq: Once | INTRAVENOUS | Status: AC
  Administered 2023-03-12: 150 mg via INTRAVENOUS
  Filled 2023-03-12: qty 150

## 2023-03-12 MED ORDER — DIPHENHYDRAMINE HCL 50 MG/ML IJ SOLN
25.0000 mg | Freq: Once | INTRAMUSCULAR | Status: AC
  Administered 2023-03-12: 25 mg via INTRAVENOUS
  Filled 2023-03-12: qty 1

## 2023-03-12 MED ORDER — SODIUM CHLORIDE 0.9 % IV SOLN
200.0000 mg | Freq: Once | INTRAVENOUS | Status: AC
  Administered 2023-03-12: 200 mg via INTRAVENOUS
  Filled 2023-03-12: qty 200

## 2023-03-12 MED ORDER — DEXAMETHASONE SODIUM PHOSPHATE 10 MG/ML IJ SOLN
10.0000 mg | Freq: Once | INTRAMUSCULAR | Status: AC
  Administered 2023-03-12: 10 mg via INTRAVENOUS
  Filled 2023-03-12: qty 1

## 2023-03-12 MED ORDER — FAMOTIDINE IN NACL 20-0.9 MG/50ML-% IV SOLN
20.0000 mg | Freq: Once | INTRAVENOUS | Status: AC
  Administered 2023-03-12: 20 mg via INTRAVENOUS
  Filled 2023-03-12: qty 50

## 2023-03-12 MED ORDER — SODIUM CHLORIDE 0.9 % IV SOLN
142.0000 mg | Freq: Once | INTRAVENOUS | Status: AC
  Administered 2023-03-12: 140 mg via INTRAVENOUS
  Filled 2023-03-12: qty 14

## 2023-03-12 MED ORDER — PALONOSETRON HCL INJECTION 0.25 MG/5ML
0.2500 mg | Freq: Once | INTRAVENOUS | Status: AC
  Administered 2023-03-12: 0.25 mg via INTRAVENOUS
  Filled 2023-03-12: qty 5

## 2023-03-12 MED ORDER — SODIUM CHLORIDE 0.9 % IV SOLN: INTRAVENOUS | Status: DC

## 2023-03-12 MED ORDER — PACLITAXEL CHEMO INJECTION 300 MG/50ML
150.0000 mg/m2 | Freq: Once | INTRAVENOUS | Status: AC
  Administered 2023-03-12: 216 mg via INTRAVENOUS
  Filled 2023-03-12: qty 36

## 2023-03-12 MED ORDER — SODIUM CHLORIDE 0.9% FLUSH
10.0000 mL | Freq: Once | INTRAVENOUS | Status: AC
  Administered 2023-03-12: 10 mL

## 2023-03-12 NOTE — Telephone Encounter (Signed)
 Copied from CRM [id number]. Topic: Clinical - Medication Refill >> Mar 12, 2023  9:13 AM Wyona SQUIBB wrote: Most Recent Primary Care Visit:  Provider: [facility]  Department[facility]  Visit Type: LAB  Date: 01/13/2023  Medication: PT is needing a prescription for a walker   Has the patient contacted their pharmacy? Yes (Agent: If no, request that the patient contact the pharmacy for the refill. If patient does not wish to contact the pharmacy document the reason why and proceed with request.) (Agent: If yes, when and what did the pharmacy advise?)  Is this the correct pharmacy for this prescription? Yes If no, delete pharmacy and type the correct one.  This is the patient's preferred pharmacy:  [id number] - [place], [place] - [address] AT [facility] [address] [place] [address] Phone: [phone] Fax: [phone]  [id number] [place], [place] - [address] AT [facility] [address] [place] [address] Phone: [phone] Fax: [phone]  [facility] [address] [place] [address] Phone: [phone] Fax: [phone]   Has the prescription been filled recently? No  Is the patient out of the medication? Yes  Has the patient been seen for an appointment in the last year OR does the patient have an upcoming appointment? Yes  Can we respond through MyChart? No  Agent: Please be advised that Rx refills may take up to 3 business days. We ask that you follow-up with your pharmacy.

## 2023-03-12 NOTE — Patient Instructions (Signed)
 [facility] - A DEPT OF [facility]  Discharge Instructions: Thank you for choosing [facility] to provide your oncology and hematology care.   If you have a lab appointment with the Cancer Center, please go directly to the Cancer Center and check in at the registration area.   Wear comfortable clothing and clothing appropriate for easy access to any Portacath or PICC line.   We strive to give you quality time with your provider. You may need to reschedule your appointment if you arrive late (15 or more minutes).  Arriving late affects you and other patients whose appointments are after yours.  Also, if you miss three or more appointments without notifying the office, you may be dismissed from the clinic at the provider's discretion.      For prescription refill requests, have your pharmacy contact our office and allow 72 hours for refills to be completed.    Today you received the following chemotherapy and/or immunotherapy agents: pembrolizumab, paclitaxel, and carboplatin      To help prevent nausea and vomiting after your treatment, we encourage you to take your nausea medication as directed.  BELOW ARE SYMPTOMS THAT SHOULD BE REPORTED IMMEDIATELY: *FEVER GREATER THAN 100.4 F (38 C) OR HIGHER *CHILLS OR SWEATING *NAUSEA AND VOMITING THAT IS NOT CONTROLLED WITH YOUR NAUSEA MEDICATION *UNUSUAL SHORTNESS OF BREATH *UNUSUAL BRUISING OR BLEEDING *URINARY PROBLEMS (pain or burning when urinating, or frequent urination) *BOWEL PROBLEMS (unusual diarrhea, constipation, pain near the anus) TENDERNESS IN MOUTH AND THROAT WITH OR WITHOUT PRESENCE OF ULCERS (sore throat, sores in mouth, or a toothache) UNUSUAL RASH, SWELLING OR PAIN  UNUSUAL VAGINAL DISCHARGE OR ITCHING   Items with * indicate a potential emergency and should be followed up as soon as possible or go to the Emergency Department if any problems should occur.  Please show the  CHEMOTHERAPY ALERT CARD or IMMUNOTHERAPY ALERT CARD at check-in to the Emergency Department and triage nurse.  Should you have questions after your visit or need to cancel or reschedule your appointment, please contact [facility] - A DEPT OF [facility]  Dept: [phone]  and follow the prompts.  Office hours are 8:00 a.m. to 4:30 p.m. Monday - Friday. Please note that voicemails left after 4:00 p.m. may not be returned until the following business day.  We are closed weekends and major holidays. You have access to a nurse at all times for urgent questions. Please call the main number to the clinic Dept: [phone] and follow the prompts.   For any non-urgent questions, you may also contact your provider using MyChart. We now offer e-Visits for anyone 7 and older to request care online for non-urgent symptoms. For details visit [email or web address].   Also download the MyChart app! Go to the app store, search MyChart, open the app, select [place], and log in with your MyChart username and password.

## 2023-03-12 NOTE — Telephone Encounter (Signed)
 Patient called back requesting prescription for walker with seat. She would like to pick up on Monday.

## 2023-03-12 NOTE — Progress Notes (Signed)
 Per Dr Randye Buttner, give premeds and Keytruda  through midline catheter. IV team to place [facility] for chemo infusion.

## 2023-03-12 NOTE — Progress Notes (Signed)
 [facility]  ONCOLOGY CLINIC PROGRESS NOTE   Patient Care Team: Oley Bascom RAMAN, NP as PCP - General (Pulmonary Disease) O'Neal, Darryle Ned, MD as PCP - Cardiology (Cardiology) Bensimhon, Toribio SAUNDERS, MD as PCP - Advanced Heart Failure (Cardiology) Prentis Duwaine BROCKS, RN as Oncology Nurse Navigator  PATIENT NAME: Tina Watkins   MR#: [id number] DOB: Jun 27, 1965  Date of visit: 03/12/2023   ASSESSMENT & PLAN:   JENELLA CRAIGIE is a 58 y.o. lady with multiple medical comorbidities including kidney transplant, currently ESRD on HD, congestive heart failure, atrial fibrillation/flutter, COPD, depression, chronic gout, chronic pain, anemia of chronic disease, gastroparesis, midodrine  dependent hypotension, history of strokes, suspected mesenteric ischemia, was recently diagnosed with malignant lung nodule on 11/16/2022 via bronchoscopy.  Immunostains suggestive of adenocarcinoma.   Lung cancer, upper lobe (HCC) -Please review oncology history for additional details and timeline of events.  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  -Her case was discussed in tumor conference on 01/07/2023.  Consensus opinion is that these lung nodules are malignancy related.  Hence clinically it appears to be stage IVa disease.  All treatment options are palliative in nature and not curative in intent and this was explained to patient and her fianc and they verbalized understanding.  -Previously we submitted request for [facility] One mutation testing on the specimen.  It showed no actionable mutations.  -Plan for chemotherapy with immunotherapy or immunotherapy alone.  Since Alimta could not be used because she is on hemodialysis, will plan for carboplatin , paclitaxel , pembrolizumab  for 4 cycles followed by maintenance treatments with pembrolizumab  alone every  3 weeks.  We will dose reduce carboplatin  to AUC 4 and paclitaxel  to 150 mg/m, given dialysis status.  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  She could not even receive PICC line placement and ended up getting midline placed in the left arm.  IR suggested that we should check with nephrology to see if we can use her dialysis catheter for chemotherapy.  Dr. Jerrye, her nephrologist approved use of dialysis catheter for chemo, in case it is needed.  However she does not have any other access left and if femoral dialysis catheter were to fail, then she has no other option for dialysis other than possibly a transhepatic catheter and if that fails, then palliative care and death.  -We were able to get peripheral IV inserted using the help of for IV team and proceed with cycle 1 of carboplatin  and paclitaxel  on 02/17/2023.  She received Keytruda  first dose on 02/12/2023.  Tolerated cycle 1 overall well with expected side effects of decreased appetite and altered taste.    -Pancreatic lesion biopsy at [facility] on 02/10/2023 came back positive for adenocarcinoma.  Unsure if it is primary pancreatic or metastatic from lung.  Immunostains were not obtained and we have submitted request for this.  MRI of the brain from 01/29/2023 showed 1.2 cm enhancing mass in the left insula, consistent with metastatic disease.  Started her on Decadron  4 mg p.o. twice daily and referral sent to radiation oncology for further evaluation and management.  She was evaluated by Dr. Dewey and she was treated with [facility] in a single session on 03/10/2023.  Recent CT of the chest incidentally also showed liver lesions which are new and concerning for metastatic disease.    On 02/22/2023, CT  abdomen and pelvis showed similar findings but indeterminate.  Metastatic disease could not be ruled out.  Following results of pancreatic biopsy immunostains, we will consider liver biopsy to determine etiology of liver  lesions-lung versus pancreatic primary.  Labs today reveal no dose-limiting toxicities. Cycle 2 due today.  Will proceed with cycle 2 of carbo, Taxol  and Keytruda  today.  It was brought to our attention that immunostains on lung specimen at [facility] showed no reactivity to TTF-1 and there is some discussion whether lung nodules are metastatic from pancreatic primary.  I discussed her case with our pathologist Dr. Norleen Dover today.  They are in the process of obtaining slides from [facility] and additional information to follow.  Depending on final consensus, if treatment plan needs to be adjusted, we will do so.  This was discussed with patient and her husband today.  They verbalized understanding.   Lesion of pancreas Heterogeneous pancreatic cystic lesion was not FDG avid on recent PET scan.  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   -Case reviewed by Dr. Wilhelmenia who recommended workup at a tertiary GI center.  She had biopsy of this on 02/10/2023 at [facility].  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  However immunostains were not obtained.  Previously we requested immunostains to be submitted to determine whether or if it is pancreatic primary versus metastatic disease from lung.  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  Further plan of care to be determined depending on these results.  -Her CA 19-9 was undetectable but CEA was elevated at 230 on 12/30/2022.  This will be monitored on future visits.  It was brought to our attention that immunostains on lung specimen at [facility] showed no reactivity to TTF-1 and there is some discussion whether lung nodules are metastatic from pancreatic primary.  I discussed her case with our pathologist Dr. Norleen Dover today.  They are in the process of obtaining slides from [facility]  and additional information to follow.  Depending on final consensus, if treatment plan needs to be adjusted, we will do so.  This was discussed with patient and her husband today.  They verbalized understanding  Anemia in chronic kidney disease Possibly related to kidney disease. Her mircera is on hold due to the malignancy.   -Order iron  and other relevant tests to determine if replacement is needed.  -She was given 1 unit of PRBCs on 02/12/2023.  Hemoglobin is stable at 8.6 today.  Since she is on chemotherapy, goal is to maintain hemoglobin closer to 8.  No indication for transfusion currently.    I reviewed lab results and outside records for this visit and discussed relevant results with the patient. Diagnosis, plan of care and treatment options were also discussed in detail with the patient. Opportunity provided to ask questions and answers provided to her apparent satisfaction. Provided instructions to call our clinic with any problems, questions or concerns prior to return visit. I recommended to continue follow-up with PCP and sub-specialists. She verbalized understanding and agreed with the plan.   NCCN guidelines have been consulted in the planning of this patient's care.  I spent a total of 30 minutes during this encounter with the patient including review of chart and various tests results, discussions about plan of care and coordination of care plan.   Emeree Mahler, MD   [facility]  [facility] - A DEPT OF [facility] [address] [place] [address] Dept: [phone] Dept Fax: [phone]    CHIEF COMPLAINT/ REASON FOR VISIT:   Lung adenocarcinoma with multiple bilateral lung nodules, 1 intracranial metastasis.  Stage IV disease.  Also has adenocarcinoma in the pancreas, primary versus metastatic from lung.   Current Treatment: Plan made for palliative systemic chemoimmunotherapy with carboplatin , paclitaxel   and pembrolizumab .  Pemetrexed could not be used because of hemodialysis status.  Started treatments from 02/12/2023 using peripheral IV.  She cannot get PICC line or Port-A-Cath placement because of bad vasculopathy.  INTERVAL HISTORY:    Discussed the use of AI scribe software for clinical note transcription with the patient, who gave verbal consent to proceed.   Gerrie JONETTA Birmingham is here today for repeat clinical assessment.   She is currently on three liters of oxygen at home, which she does not use all the time. She reports feeling okay when she takes off the oxygen to wash up or while sitting in the living room. She is due for reassessment for a portable oxygen tank, as she finds her current tank too large.  The patient's cancer treatment involves chemotherapy and immunotherapy. She has had one treatment so far and is due for her second treatment. She also reports having had a procedure called CyberKnife for a spot in her lung. She is currently on dexamethasone , which she will start to wean off. She reports some bloating and has regular bowel movements. She has Imodium at home for diarrhea, which she had previously but has since stopped.  The patient also reports feeling a little short of breath, more so than usual. She has a midline in place.  I have reviewed the past medical history, past surgical history, social history and family history with the patient and they are unchanged from previous note.  HISTORY OF PRESENT ILLNESS:   ONCOLOGY HISTORY:    Patient was seen by pulmonology after she was diagnosed with COVID-19 infection in the ED on 09/14/2022.  Because of her chronic smoking history with at least 51-pack-year smoking, a low-dose CT of the chest was obtained for lung cancer screening.  On 09/28/2022, CT chest showed dominant new solid peripheral left upper lobe lung nodule measuring 21 mm.  Several new clustered solid pulmonary nodules throughout left upper lobe, largest measuring  5 mm.  Numerous additional tiny 2 to 3 mm solid pulmonary nodules scattered in both lungs, new finding.  Findings concerning for primary bronchogenic malignancy.  Lung RADS 4B, suspicious.  Also noted was cystic pancreatic tail lesions, largest measuring 2.6 cm posteriorly, indeterminate.  MRI abdomen recommended for further evaluation.   Because of CT chest findings, PET/CT was obtained on 10/19/2022 and it showed 2.6 cm subpleural nodule in the left upper lobe with SUV max of 4.9.  Primary bronchogenic neoplasm could not be excluded.  Tissue sampling was recommended.  Also noted was 15 mm short axis prevascular lymph node with SUV of 2.4, new compared to 2023.  Indeterminate.  Small left pleural effusion was also noted.   Several subcentimeter lung nodules were noted on CT scan . They are below the threshold by size, for determining FDG activity and hence were not apparent on PET scan.    MRI of the abdomen on 11/11/2022: Evaluation of the pancreatic tail was limited by metallic susceptibility artifact from adjacent surgical clips as well as iron  burden in the adjacent stomach parenchyma.  There was a complex, multiseptated, internally heterogeneous cystic lesion arising from the tip of the pancreatic tail measuring 4.4 x 2.3 x 3.2 cm.  Contrast enhancement could not be confirmed due to technical limitations.  Appearance was concerning for a partially cystic malignancy with a significant solid component.  Tissue sampling was recommended.  For future, recommended multiphasic contrast-enhanced CT.  Severe hepatic and splenic iron  deposition was noted.  Coarse, nodular contour of the liver, consistent with cirrhosis.  Status post bilateral native nephrectomy.  Right lower quadrant renal transplant graft was noted.  Diffusely heterogeneous bone marrow, reflecting marrow reconversion renal osteodystrophy.   On 11/11/2022, she had multidetector CT imaging of the chest using thin slice collimation for  electromagnetic bronchoscopy planning purposes.  It showed similar findings compared to prior imaging.   On 11/16/2022, Dr. Shelah performed robotic assisted navigational bronchoscopy and biopsies.  Left upper lobe lung nodule needle aspiration biopsy showed presence of malignant cells.  Immunostains were obtained at a later date and showed positivity for TTF-1 and negative for p40 and cytokeratin 5/6, consistent with lung adenocarcinoma.  Endobronchial brushings and brushings from left upper lobe lung nodule showed no malignant cells.   She was admitted to the hospital on 12/18/2022 after she presented with shortness of breath, cough with yellow sputum, chills, myalgias and diarrhea.  She was noted to be in acute hypoxic respiratory failure, related to postobstructive pneumonia.  CT angiogram of the chest on 12/18/2022 showed no evidence of pulmonary embolism.  It did show numerous small nodules throughout right lung which appeared to be increased in number compared to prior study.  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  Stable mediastinal lymphadenopathy.   Underwent left-sided thoracentesis on 12/20/2022.  Pleural fluid cytology showed no evidence of malignant cells.   NGS testing showed no actionable mutations.  Plan made for palliative chemoimmunotherapy with carboplatin , paclitaxel  and pembrolizumab .    Port-A-Cath could not be placed because of bad vasculopathy.  She could not even receive PICC line placement and ended up getting midline placed in the left arm.  Presented to clinic on 02/12/2023 for initiation of treatments.  Her hemoglobin was 7 and she had to be given 1 unit of PRBC transfusion.  We proceeded with pembrolizumab  that day.    Started carboplatin  and paclitaxel  at a later date on 02/17/2023.  Chemotherapy is being given through peripheral IV using help  of vascular access team.  -Pancreatic lesion biopsy at [facility] on 02/10/2023 came back positive for adenocarcinoma.  Unsure if it is primary pancreatic or metastatic from lung.  Immunostains were not obtained and we have submitted request for this.  MRI of the brain from 01/29/2023 showed 1.2 cm enhancing mass in the left insula, consistent with metastatic disease.  Started her on Decadron  4 mg p.o. twice daily and referral sent to radiation oncology for further evaluation and management.  She was evaluated by Dr. Dewey.  Plan for SRS in a single session on 03/10/2023.  Recent CT of the chest incidentally also showed liver lesions which are new and concerning for metastatic disease.    On 02/22/2023, CT abdomen and pelvis showed similar findings but indeterminate.  Metastatic disease could not be ruled out.  Following results of pancreatic biopsy immunostains, we will consider liver biopsy to determine etiology of liver lesions-lung versus pancreatic primary.   Oncology History  Lung cancer,  upper lobe (HCC)  10/28/2022 Initial Diagnosis   Lung cancer, upper lobe (HCC)   01/11/2023 Cancer Staging   Staging form: Lung, AJCC 8th Edition - Clinical: Stage IVA (cTX, cNX, cM1a) - Signed by Autumn Millman, MD on 01/11/2023   02/12/2023 -  Chemotherapy   Patient is on Treatment Plan : LUNG NSCLC Carboplatin  (6) + Paclitaxel  (200) + Pembrolizumab  (200) D1 q21d x 4 cycles / Pembrolizumab  (200) Maintenance D1 q21d         REVIEW OF SYSTEMS:   Review of Systems - Oncology  All other pertinent systems were reviewed with the patient and are negative.  ALLERGIES: She is allergic to levofloxacin  and tape.  MEDICATIONS:  Current Outpatient Medications  Medication Sig Dispense Refill   allopurinol  (ZYLOPRIM ) 100 MG tablet Take 0.5 tablets (50 mg total) by mouth 2 (two) times a week. (Patient taking differently: Take 100 mg by mouth daily in the afternoon.) 90 tablet 3   amiodarone  (PACERONE )  200 MG tablet Take 0.5 tablets (100 mg total) by mouth daily. 90 tablet 1   apixaban  (ELIQUIS ) 2.5 MG TABS tablet Take 1 tablet (2.5 mg total) by mouth 2 (two) times daily. Okay to restart this medication on 11/18/2022     AURYXIA  1 GM 210 MG(Fe) tablet Take 420 mg by mouth 3 (three) times daily.     Colchicine  0.6 MG CAPS Take 0.6 mg by mouth daily as needed (gout flare up). 30 capsule 2   cyclobenzaprine  (FLEXERIL ) 5 MG tablet Take 1 tablet (5 mg total) by mouth at bedtime as needed. 90 tablet 1   dexamethasone  (DECADRON ) 4 MG tablet Take 2 tablets (8mg ) by mouth daily starting the day after carboplatin  for 3 days. Take with food 30 tablet 1   dexamethasone  (DECADRON ) 4 MG tablet Take 1 tablet (4 mg total) by mouth 2 (two) times daily with a meal. 60 tablet 0   diclofenac  Sodium (VOLTAREN ) 1 % GEL Apply 4 g topically as needed. 50 g 0   diphenhydrAMINE  (BENADRYL ) 25 mg capsule Take 1 capsule (25 mg total) by mouth every 8 (eight) hours as needed. 30 capsule 6   dorzolamide -timolol  (COSOPT ) 2-0.5 % ophthalmic solution Place 1 drop into the left eye 2 (two) times daily.     famotidine  (PEPCID ) 20 MG tablet TAKE 1 TABLET BY MOUTH TWICE A DAY 180 tablet 3   fluticasone -salmeterol (ADVAIR  HFA) 115-21 MCG/ACT inhaler Inhale 2 puffs into the lungs 2 (two) times daily. 1 each 12   gabapentin  (NEURONTIN ) 100 MG capsule Take 100 mg by mouth 3 (three) times daily as needed (nerve pain).     heparin  sodium, porcine, 1000 UNIT/ML injection Inject 1,000 Units into the peritoneum once.     Lactulose  20 GM/30ML SOLN Take 30 mLs (20 g total) by mouth 2 (two) times daily as needed (constipation). 450 mL 2   metoCLOPramide  (REGLAN ) 10 MG tablet Take 1 tablet (10 mg total) by mouth 3 (three) times daily before meals. (Patient taking differently: Take 10 mg by mouth daily in the afternoon.) 90 tablet 2   midodrine  (PROAMATINE ) 10 MG tablet TAKE 1.5 TABLETS (15 MG TOTAL) BY MOUTH 3 (THREE) TIMES DAILY. NEEDS FOLLOW UP  APPOINTMENT FOR ANYMORE REFILLS 405 tablet 0   omeprazole  (PRILOSEC) 40 MG capsule TAKE 1 CAPSULE (40 MG TOTAL) BY MOUTH DAILY. 90 capsule 1   ondansetron  (ZOFRAN ) 8 MG tablet Take 1 tablet (8 mg total) by mouth every 8 (eight) hours as needed for nausea or vomiting.  Start on the third day after carboplatin . 30 tablet 1   oxyCODONE -acetaminophen  (PERCOCET) 10-325 MG tablet Take 1 tablet by mouth 4 (four) times daily as needed for pain.     PRESCRIPTION MEDICATION Take 1 tablet by mouth 2 (two) times daily. Unknown name of medication for brain tumor.     senna-docusate (SENOKOT-S) 8.6-50 MG tablet Take 1 tablet by mouth at bedtime. 30 tablet 0   sertraline  (ZOLOFT ) 25 MG tablet Take 1 tablet (25 mg total) by mouth daily. 30 tablet 3   VELPHORO  500 MG chewable tablet Taking 500 mg by mouth per meal or snack     zolpidem  (AMBIEN ) 10 MG tablet Take 1 tablet (10 mg total) by mouth at bedtime as needed for sleep. 10 tablet 0   No current facility-administered medications for this visit.     VITALS:   Blood pressure 94/63, pulse 82, temperature 98.2 F (36.8 C), temperature source Temporal, resp. rate 15, weight 105 lb 3.2 oz (47.7 kg), SpO2 100%.  Wt Readings from Last 3 Encounters:  03/12/23 105 lb 3.2 oz (47.7 kg)  02/27/23 107 lb (48.5 kg)  02/26/23 107 lb (48.5 kg)    Body mass index is 21.25 kg/m.  Performance status (ECOG): 2 - Symptomatic, <50% confined to bed  PHYSICAL EXAM:   Physical Exam Constitutional:      General: She is not in acute distress. HENT:     Head: Normocephalic and atraumatic.  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     Heart sounds: Normal heart sounds.  Pulmonary:     Effort: Pulmonary effort is normal.     Comments: Dullness on left side up to infrascapular area Abdominal:     General: There is no distension.  Musculoskeletal:     Right lower leg: No edema.     Left lower  leg: No edema.     Comments: Left arm midline in place  Neurological:     Mental Status: She is alert and oriented to person, place, and time.  Psychiatric:        Mood and Affect: Mood normal.        Behavior: Behavior normal.        Thought Content: Thought content normal.       LABORATORY DATA:   I have reviewed the data as listed.  Results for orders placed or performed in visit on 03/12/23  CMP (Cancer Center only)  Result Value Ref Range   Sodium 138 135 - 145 mmol/L   Potassium 4.0 3.5 - 5.1 mmol/L   Chloride 97 (L) 98 - 111 mmol/L   CO2 29 22 - 32 mmol/L   Glucose, Bld 120 (H) 70 - 99 mg/dL   BUN 38 (H) 6 - 20 mg/dL   Creatinine 5.30 (H) 9.55 - 1.00 mg/dL   Calcium  9.4 8.9 - 10.3 mg/dL   Total Protein 6.5 6.5 - 8.1 g/dL   Albumin  4.0 3.5 - 5.0 g/dL   AST 21 15 - 41 U/L   ALT 27 0 - 44 U/L   Alkaline Phosphatase 248 (H) 38 - 126 U/L   Total Bilirubin 0.4 0.0 - 1.2 mg/dL   GFR, Estimated 10 (L) >60 mL/min   Anion gap 12 5 - 15  CBC with Differential (Cancer Center Only)  Result Value Ref Range   WBC Count 20.3 (H) 4.0 - 10.5 K/uL  RBC 2.99 (L) 3.87 - 5.11 MIL/uL   Hemoglobin 8.6 (L) 12.0 - 15.0 g/dL   HCT 72.4 (L) 63.9 - 53.9 %   MCV 92.0 80.0 - 100.0 fL   MCH 28.8 26.0 - 34.0 pg   MCHC 31.3 30.0 - 36.0 g/dL   RDW 82.7 (H) 88.4 - 84.4 %   Platelet Count 198 150 - 400 K/uL   nRBC 0.0 0.0 - 0.2 %   Neutrophils Relative % 94 %   Neutro Abs 19.0 (H) 1.7 - 7.7 K/uL   Lymphocytes Relative 4 %   Lymphs Abs 0.7 0.7 - 4.0 K/uL   Monocytes Relative 1 %   Monocytes Absolute 0.3 0.1 - 1.0 K/uL   Eosinophils Relative 0 %   Eosinophils Absolute 0.1 0.0 - 0.5 K/uL   Basophils Relative 0 %   Basophils Absolute 0.0 0.0 - 0.1 K/uL   Immature Granulocytes 1 %   Abs Immature Granulocytes 0.21 (H) 0.00 - 0.07 K/uL     RADIOGRAPHIC STUDIES:  I have personally reviewed the radiological images as listed and agree with the findings in the report.  MR BRAIN W WO  CONTRAST Result Date: 03/01/2023 CLINICAL DATA:  Brain/CNS neoplasm, staging. EXAM: MRI HEAD WITHOUT AND WITH CONTRAST TECHNIQUE: Multiplanar, multiecho pulse sequences of the brain and surrounding structures were obtained without and with intravenous contrast. CONTRAST:  5mL GADAVIST  GADOBUTROL  1 MMOL/ML IV SOLN COMPARISON:  MRI of the brain January 29, 2023. FINDINGS: Brain: There has been interval growth of the left insular enhancing lesion, measuring approximately 15 x 11 mm compared to 11 x 9 mm on prior. There is no surrounding edema. No other lesion identified. Areas of encephalomalacia and gliosis with associated hemosiderin deposit in the right parietal and occipital lobes. Small remote infarcts in the left basal ganglia and right centrum semiovale. Hemosiderin deposit in the choroid plexus along the lateral ventricles and fourth ventricle suggest prior hemorrhage. Scattered and confluent foci T2 hyperintensity are seen within the white matter the cerebral hemispheres nonspecific. No acute infarction, hemorrhage, hydrocephalus or extra-axial collection. Vascular: Normal flow voids. Skull and upper cervical spine: Diffuse decrease of the T1 signal of the calvarium and clivus, likely related to chronic anemia. Sinuses/Orbits: Right phthisis bulbi.  Paranasal sinuses are clear. Other: None. IMPRESSION: Interval growth of the left insular enhancing lesion, measuring approximately 15 x 11 mm compared to 11 x 9 mm on prior. No surrounding edema. No other lesion identified. Electronically Signed   By: Katyucia  de Macedo Rodrigues M.D.   On: 03/01/2023 15:54   DG Abd 1 View Result Date: 02/28/2023 CLINICAL DATA:  Constipation EXAM: ABDOMEN - 1 VIEW COMPARISON:  05/10/2019 FINDINGS: Scattered large and small bowel gas is noted. No obstructive changes are seen. Dialysis catheter is noted via right femoral approach with the tip in the IVC. Multiple surgical changes are noted. No significant retained fecal  material is noted. No free air is seen. SVC stent is noted. IMPRESSION: No evidence of constipation or obstructive change. Electronically Signed   By: Oneil Devonshire M.D.   On: 02/28/2023 19:28   CT ABDOMEN PELVIS W CONTRAST Result Date: 02/27/2023 CLINICAL DATA:  History of stage IV lung cancer on chemotherapy presenting with upper mid abdominal pain associated with nausea, vomiting, and diarrhea. Pancreatic lesions with biopsy showing adenocarcinoma. * Tracking Code: BO * EXAM: CT ABDOMEN AND PELVIS WITH CONTRAST TECHNIQUE: Multidetector CT imaging of the abdomen and pelvis was performed using the standard protocol following bolus administration  of intravenous contrast. RADIATION DOSE REDUCTION: This exam was performed according to the departmental dose-optimization program which includes automated exposure control, adjustment of the mA and/or kV according to patient size and/or use of iterative reconstruction technique. CONTRAST:  75mL OMNIPAQUE  IOHEXOL  350 MG/ML SOLN COMPARISON:  CT abdomen and pelvis dated 02/22/2023 FINDINGS: Lower chest: Innumerable right basilar pulmonary nodules measuring up to 7 x 5 mm in the peripheral right middle lobe (5:11), grossly unchanged compared to 02/22/2023. Diffuse interlobular septal thickening of the right lung base, which appears slightly increased in irregularity nodularity, for example along the major fissure. Similar left pleural effusion with near-complete atelectasis and consolidation of the lingula and left lower lobe. Partially imaged heart size is normal. Extensive intrathoracic collaterals are partially imaged. Hepatobiliary: Multifocal hepatic hypodensities are again seen, a few of which are slightly increased in size, for example 1.6 cm segment 7/8 (3:17), previously 14 mm and 1.3 cm segment 2/3 (3:22), previously 11 mm. Mildly nodular hepatic contour. No intra or extrahepatic biliary ductal dilation. Normal gallbladder. Pancreas: No substantial change in size  of solid and cystic mass in the pancreatic tail measuring 4.9 x 2.7 cm (3:31). Spleen: Normal in size without focal abnormality. Adrenals/Urinary Tract: Left adrenal gland is not well seen. Normal right adrenal gland. 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 Transplant right lower quadrant kidney without hydronephrosis or calculi. Decompressed urinary bladder. Stomach/Bowel: Normal appearance of the stomach. No evidence of bowel wall thickening, distention, or inflammatory changes. Appendix is not discretely seen. Vascular/Lymphatic: Right femoral approach vascular catheter tip terminates in the infrarenal IVC. Aortic atherosclerosis. Perirectal/presacral collaterals are again seen. Recanalized paraumbilical vein and subcutaneous collaterals are also present. Unchanged heterogeneous calcification in the right lower quadrant anterior to the external iliac vessels. No enlarged abdominal or pelvic lymph nodes. Reproductive: No adnexal masses. Other: Trace free fluid.  No free air or fluid collection. Musculoskeletal: No acute or abnormal lytic or blastic osseous lesions. 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 Increased diffuse body wall edema. IMPRESSION: 1. Multifocal hepatic hypodensities, a few of which are slightly increased in size, suspicious for metastatic disease. 2. Innumerable right basilar pulmonary nodules are grossly unchanged compared to 02/22/2023. Diffuse interlobular septal thickening of the right lung base, which appears slightly increased in irregularity and nodularity, suspicious for lymphangitic carcinomatosis. 3. No substantial change in size of solid and cystic mass in the pancreatic tail measuring 4.9 x 2.7 cm, consistent with known adenocarcinoma. 4. Similar left pleural effusion with near-complete atelectasis and consolidation of the lingula and left lower lobe. 5. Increased diffuse body wall edema. 6. Right femoral head  avascular necrosis without collapse. 7.  Aortic Atherosclerosis (ICD10-I70.0). Electronically Signed   By: Limin  Xu M.D.   On: 02/27/2023 09:23   DG Chest Port 1 View Result Date: 02/27/2023 CLINICAL DATA:  Hepatocellular carcinoma and lung carcinoma. Shortness of breath. EXAM: PORTABLE CHEST 1 VIEW COMPARISON:  02/03/2023 FINDINGS: Increased near-complete opacification of the left hemithorax is seen without mediastinal shift. This may be due to progressive consolidation and/or enlarging left pleural effusion. Tiny nodular opacities and diffuse interstitial prominence throughout the right lung is unchanged. No right pleural effusion seen. Stable right paratracheal soft tissue prominence, consistent with mass or lymphadenopathy. SVC stent remains in place. IMPRESSION: Increased near-complete opacification of left hemithorax, which may be due to progressive consolidation and/or enlarging left pleural effusion. Stable tiny nodular opacities and diffuse interstitial prominence throughout the right lung. Stable right paratracheal  soft tissue prominence, consistent with mass or lymphadenopathy. Electronically Signed   By: Norleen DELENA Kil M.D.   On: 02/27/2023 09:19   CT CHEST ABDOMEN PELVIS W CONTRAST Result Date: 02/22/2023 CLINICAL DATA:  Hepatocellular carcinoma, assessment of treatment response, status post chemotherapy. Lung cancer diagnosed in 2024. Dialysis 3 times a week. Prior nephrectomy. * Tracking Code: BO * EXAM: CT CHEST, ABDOMEN, AND PELVIS WITH CONTRAST TECHNIQUE: Multidetector CT imaging of the chest, abdomen and pelvis was performed following the standard protocol during bolus administration of intravenous contrast. RADIATION DOSE REDUCTION: This exam was performed according to the departmental dose-optimization program which includes automated exposure control, adjustment of the mA and/or kV according to patient size and/or use of iterative reconstruction technique. CONTRAST:  75mL OMNIPAQUE  IOHEXOL   300 MG/ML  SOLN COMPARISON:  CTA chest from 02/03/2023. MRI abdomen 11/11/2022. PET-CT 10/19/2022. FINDINGS: CT CHEST FINDINGS Cardiovascular: Reduced caliber left pulmonary artery likely attributable to hypoventilation induced raise a constriction. SVC stent noted. Chronic calcific clot in the left subclavian vein and brachiocephalic vein, with near occluded brachiocephalic vein and extensive venous collaterals through the azygous system, right internal mammary vein, and elsewhere. Coronary, aortic arch, and branch vessel atherosclerotic vascular disease. Mediastinum/Nodes: Right paratracheal node/mass 2.9 cm in short axis, previously 2.8 cm. Subcarinal adenopathy 2.0 cm in short axis, formerly the same. Lungs/Pleura: Moderate to large left pleural effusion. Consolidated left upper lobe with fiducial adjacent to a 2.8 by 1.9 cm left upper lobe hypodense lesion on image 21 series 3. The vast majority of the left lower lobe is atelectatic. Innumerable pulmonary nodules throughout the right lung are observed, generally under 1 cm in diameter. These are similar to previous. Ground-glass opacities in the right lung and interstitial accentuation in the right lung is improved compared to 02/03/2023, and the right pleural effusion has resolved. An index right upper lobe nodule measures 5 by 6 mm on image 12 series 5, stable. Musculoskeletal: Mild sclerosis, query renal osteodystrophy. CT ABDOMEN PELVIS FINDINGS Hepatobiliary: Scattered hypodense hepatic lesions are observed, not present on 11/11/2022 and similar to. Index right hepatic lobe lesion adjacent to the intrahepatic portion of the IVC measures 1.5 by 1.4 cm on image 45 series 3 (stable from 02/03/2023). There are about 15 lesions. Borderline distended gallbladder. No biliary dilatation. Patent portal vein. Hepatic cirrhosis noted. Pancreas: Mass of the pancreatic tail with cystic elements and possible solid elements, measuring 5.4 by 2.9 by 3.9 cm. Roughly  similar appearance to the MRI from 11/11/2022, this could be a postinflammatory lesion, serous cystadenoma, or intraductal papillary mucinous neoplasm. Metastatic disease with cystic elements not excluded. Spleen: Upper normal splenic size. Adrenals/Urinary Tract: Right adrenal gland normal. Left adrenal gland poorly seen. Bilateral native nephrectomies with a atrophic transplant kidney along the right pelvis. Urinary bladder is relatively empty. Stomach/Bowel: Air-level in the sigmoid colon suggesting diarrheal process. No substantial bowel wall thickening or dilated bowel. Vascular/Lymphatic: Atherosclerosis is present, including aortoiliac atherosclerotic disease. Right femoral dialysis catheter terminates in the IVC chronic dense calcification along the anterior margin of the right external iliac artery measuring 2.9 by 1.5 cm on image 81 series 3, not certain whether this is a chronically thrombosed saccular aneurysm or new intra-lesion along the right adnexa, versus a chronic calcified lymph node. This was not hypermetabolic on PET-CT of 10/19/2022. Perirectal venous collateral vessels noted Reproductive: Calcified lesion is noted in the vascular section above in the vicinity of the right adnexa. Otherwise unremarkable. Other: Mild subcutaneous edema noted.  No current ascites.  Musculoskeletal: Faint sclerosis possibly from renal osteodystrophy. 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 No collapse or fracture. IMPRESSION: 1. Moderate to large left pleural effusion with near complete atelectasis of the left lower lobe. 2. Completely consolidated left upper lobe with fiducial adjacent to a 2.8 by 1.9 cm left upper lobe hypodense lesion. 3. Innumerable pulmonary nodules throughout the right lung, generally under 1 cm in diameter, similar to previous. 4. Mediastinal adenopathy, stable. 5. Scattered hypodense hepatic lesions, not present on 11/11/2022 and stable from 02/03/2023, not entirely specific but  favoring metastatic disease. 6. Mass of the pancreatic tail with cystic elements and possible solid elements, roughly similar to the MRI from 11/11/2022, this could be a postinflammatory lesion, serous cystadenoma, metastatic disease, or intraductal papillary mucinous neoplasm. 7. Chronic calcific clot in the left subclavian vein and brachiocephalic vein, with near occluded brachiocephalic vein and extensive venous collaterals through the azygous system, right internal mammary vein, and elsewhere. SVC stent noted. 8. Chronic avascular necrosis of the right femoral head. No collapse or fracture. 9. Hepatic cirrhosis. 10. Air-level in the sigmoid colon suggesting diarrheal process. 11. Chronic dense calcification along the anterior margin of the right external iliac artery measuring 2.9 by 1.5 cm, not certain whether this is a chronically thrombosed saccular aneurysm or new intra-lesion along the right adnexa, versus a chronic calcified lymph node. This was not hypermetabolic on PET-CT of 10/19/2022. 12. Aortic and extensive systemic atherosclerosis. Aortic Atherosclerosis (ICD10-I70.0). Electronically Signed   By: Ryan Salvage M.D.   On: 02/22/2023 20:18    CODE STATUS:  Code Status History     Date Active Date Inactive Code Status Order ID Comments User Context   12/18/2022 1552 12/24/2022 2039 Full Code [id number]  Tobie Gaines, DO ED   09/06/2021 1325 09/07/2021 2107 Full Code [id number]  Susen Pastor, MD ED   03/28/2020 1621 03/29/2020 2052 Full Code [id number]  Laurita Cort DASEN, MD ED   09/01/2019 1812 09/03/2019 2011 Full Code [id number]  Cindy Garnette POUR, MD ED   08/05/2019 1443 08/25/2019 0311 Full Code [id number]  Laurita Cort DASEN, MD ED   07/07/2019 1616 07/17/2019 2317 Full Code [id number]  Laurita Cort DASEN, MD ED   05/09/2019 0706 05/18/2019 2040 Full Code [id number]  Alfornia Madison, MD ED   03/20/2019 1507 03/24/2019 2243 Full Code [id number]  Laurita Cort DASEN, MD Inpatient   11/20/2017 0236 11/20/2017 1916 Full  Code [id number]  Hilma Rankins, MD ED   08/09/2017 2312 08/17/2017 1950 Full Code [id number]  Francesco Alan BROCKS, MD Inpatient   09/29/2015 2318 10/03/2015 1841 Full Code [id number]  Celinda Alm Lot, MD Inpatient   09/29/2015 2318 09/29/2015 2318 Full Code [id number]  Celinda Alm Lot, MD Inpatient   01/08/2015 1637 01/10/2015 1918 Full Code [id number]  Leeann Hover, MD Inpatient   06/13/2014 1008 06/16/2014 1800 Full Code [id number]  Evonnie Alm, MD Inpatient   04/10/2012 0109 04/11/2012 2008 Full Code [id number]  Alm Romance, MD Inpatient   12/10/2011 2204 12/12/2011 1423 Full Code [id number]  Ainsley Murray KIDD, RN Inpatient   05/22/2011 1520 05/30/2011 1553 Full Code [id number]  Minor, Elsie RAMAN, NP ED    Questions for Most Recent Historical Code Status (Order [id number])     Question Answer   By: Consent: discussion documented in EHR            No orders of the defined types were placed in this encounter.    Future Appointments  Date Time Provider Department Center  03/12/2023 11:00 AM  [facility] INFUSION [facility] None  03/15/2023  1:30 PM [facility] FLUSH [facility] None  03/31/2023  7:45 AM [facility] FLUSH [facility] None  03/31/2023  8:40 AM Neomi Lis T, PA-C [facility] None  03/31/2023 10:00 AM Shelah Lamar RAMAN, MD [facility] None  04/02/2023  7:30 AM [facility] INFUSION [facility] None  04/05/2023  2:00 PM [facility] FLUSH [facility] None  04/07/2023 10:40 AM Oley Bascom RAMAN, NP [facility] None  04/12/2023  9:30 AM [facility] TREATMENT [facility] None  04/28/2023 11:00 AM Rice, Lonni ORN, MD [facility] None  06/09/2023 11:00 AM [facility]-MR 1 [facility]MRI [facility]  06/14/2023  7:00 AM [facility]-TUMOR BOARD CONFERENCE [facility] None  06/14/2023  2:00 PM Lanell Donald Stagger, PA-C [facility] None  07/01/2023  1:50 PM [facility]-ANNUAL WELLNESS VISIT [facility] None  07/21/2023  9:30 AM Terra Fairy PARAS, PA-C [facility]BC None      This document was completed utilizing speech recognition software. Grammatical errors,  random word insertions, pronoun errors, and incomplete sentences are an occasional consequence of this system due to software limitations, ambient noise, and hardware issues. Any formal questions or concerns about the content, text or information contained within the body of this dictation should be directly addressed to the provider for clarification.

## 2023-03-14 LAB — T4: T4, Total: 6.1 ug/dL (ref 4.5–12.0)

## 2023-03-15 ENCOUNTER — Other Ambulatory Visit: Payer: Self-pay

## 2023-03-15 ENCOUNTER — Ambulatory Visit: Payer: Medicare Other

## 2023-03-15 ENCOUNTER — Encounter: Payer: Self-pay | Admitting: General Practice

## 2023-03-15 ENCOUNTER — Other Ambulatory Visit: Payer: Self-pay | Admitting: Nurse Practitioner

## 2023-03-15 ENCOUNTER — Encounter: Payer: Self-pay | Admitting: Nurse Practitioner

## 2023-03-15 ENCOUNTER — Ambulatory Visit: Payer: Self-pay | Admitting: Nurse Practitioner

## 2023-03-15 ENCOUNTER — Encounter (HOSPITAL_COMMUNITY): Payer: Self-pay | Admitting: *Deleted

## 2023-03-15 ENCOUNTER — Encounter: Payer: Self-pay | Admitting: Oncology

## 2023-03-15 DIAGNOSIS — Z5111 Encounter for antineoplastic chemotherapy: Secondary | ICD-10-CM | POA: Insufficient documentation

## 2023-03-15 DIAGNOSIS — R112 Nausea with vomiting, unspecified: Secondary | ICD-10-CM

## 2023-03-15 DIAGNOSIS — K3184 Gastroparesis: Secondary | ICD-10-CM

## 2023-03-15 MED ORDER — FAMOTIDINE 20 MG PO TABS: 20.0000 mg | ORAL_TABLET | Freq: Two times a day (BID) | ORAL | 3 refills | Status: DC

## 2023-03-15 NOTE — Assessment & Plan Note (Signed)
 Possibly related to kidney disease. Her mircera is on hold due to the malignancy.   -Order iron  and other relevant tests to determine if replacement is needed.  -She was given 1 unit of PRBCs on 02/12/2023.  Hemoglobin is stable at 8.6 today.  Since she is on chemotherapy, goal is to maintain hemoglobin closer to 8.  No indication for transfusion currently.

## 2023-03-15 NOTE — Telephone Encounter (Signed)
 Copied from CRM [id number]. Topic: Clinical - Medical Advice >> Mar 15, 2023 12:42 PM Tina Watkins wrote: Reason for CRM: Patient is calling in about a walker w/ a seat.   Chief Complaint: checking on prescription for walker Disposition: [] 911 / [] ED /[] Urgent Care (no appt availability in office) / [] Appointment(In office/virtual)/ []  [place] [facility]/ [] Home Care/ [] Refused Recommended Disposition /[] [place] [facility]/ [x]  Follow-up with PCP Additional Notes: Pt reporting she had been waiting on a signature from PCP on script for for 4 wheeled walker with seat. Per pt chart Era Hasty NP gave verbal cosign on 03/11/23, nurse informed pt. Pt reporting she was under the impression that she is supposed to come pick up the prescription and be told where to pick up the walker. Nurse attempted to connect to CAL, no answer. Advised the HP message would be sent to PCP office for pt to receive call back about script and next steps for getting her walker. Pt verbalized understanding. Please advise.  Reason for Disposition  [1] Caller requesting NON-URGENT health information AND [2] PCP's office is the best resource  Answer Assessment - Initial Assessment Questions 1. REASON FOR CALL or QUESTION: "What is your reason for calling today?" or "How can I best help you?" or "What question do you have that I can help answer?"     Calling for update on script for walker  Protocols used: Information Only Call - No Triage-A-AH

## 2023-03-15 NOTE — Progress Notes (Signed)
 [facility] Spiritual Care Note  Followed up with Tina Watkins by phone, getting her registered for [facility] on 04/12/2023 at noon because she wishes to designate her SO [name] and her mother as her health care agents. Also placed referral to RD team per her request.  We plan to follow up by phone next week for a Spiritual Care check-in.   [address] [name], [place], [facility] Pager [phone] Voicemail [phone]

## 2023-03-15 NOTE — Assessment & Plan Note (Addendum)
 -Please review oncology history for additional details and timeline of events.  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  -Her case was discussed in tumor conference on 01/07/2023.  Consensus opinion is that these lung nodules are malignancy related.  Hence clinically it appears to be stage IVa disease.  All treatment options are palliative in nature and not curative in intent and this was explained to patient and her fianc and they verbalized understanding.  -Previously we submitted request for [facility] mutation testing on the specimen.  It showed no actionable mutations.  -Plan for chemotherapy with immunotherapy or immunotherapy alone.  Since Alimta could not be used because she is on hemodialysis, will plan for carboplatin , paclitaxel , pembrolizumab  for 4 cycles followed by maintenance treatments with pembrolizumab  alone every 3 weeks.  We will dose reduce carboplatin  to AUC 4 and paclitaxel  to 150 mg/m, given dialysis status.  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  She could not even receive PICC line placement and ended up getting midline placed in the left arm.  IR suggested that we should check with nephrology to see if we can use her dialysis catheter for chemotherapy.  Dr. Yvonnie Heritage, her nephrologist approved use of dialysis catheter for chemo, in case it is needed.  However she does not have any other access left and if femoral dialysis catheter were to fail, then she has no other option for dialysis other than possibly a transhepatic catheter and if that fails, then palliative care and death.  -We were able to get peripheral IV inserted using the help of for IV team and proceed with cycle 1 of carboplatin  and paclitaxel  on 02/17/2023.  She received Keytruda  first dose on 02/12/2023.  Tolerated cycle 1 overall well with expected side  effects of decreased appetite and altered taste.    -Pancreatic lesion biopsy at [facility] on 02/10/2023 came back positive for adenocarcinoma.  Unsure if it is primary pancreatic or metastatic from lung.  Immunostains were not obtained and we have submitted request for this.  MRI of the brain from 01/29/2023 showed 1.2 cm enhancing mass in the left insula, consistent with metastatic disease.  Started her on Decadron  4 mg p.o. twice daily and referral sent to radiation oncology for further evaluation and management.  She was evaluated by Dr. Jeryl Moris and she was treated with [facility] in a single session on 03/10/2023.  Recent CT of the chest incidentally also showed liver lesions which are new and concerning for metastatic disease.    On 02/22/2023, CT abdomen and pelvis showed similar findings but indeterminate.  Metastatic disease could not be ruled out.  Following results of pancreatic biopsy immunostains, we will consider liver biopsy to determine etiology of liver lesions-lung versus pancreatic primary.  Labs today reveal no dose-limiting toxicities. Cycle 2 due today.  Will proceed with cycle 2 of carbo, Taxol  and Keytruda  today.  It was brought to our attention that immunostains on lung specimen at [facility] showed no reactivity to TTF-1 and there is some discussion whether lung nodules are metastatic from pancreatic primary.  I discussed her case with our pathologist Dr. Ramey [place] today.  They are in the process of obtaining slides from [facility] and additional information to follow.  Depending on final consensus, if treatment plan needs to be adjusted, we will do so.  This  was discussed with patient and her husband today.  They verbalized understanding.

## 2023-03-15 NOTE — Assessment & Plan Note (Signed)
 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   -Case reviewed by Dr. Brice Campi who recommended workup at a tertiary GI center.  She had biopsy of this on 02/10/2023 at [facility].  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  However immunostains were not obtained.  Previously we requested immunostains to be submitted to determine whether or if it is pancreatic primary versus metastatic disease from lung.  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  Further plan of care to be determined depending on these results.  -Her CA 19-9 was undetectable but CEA was elevated at 230 on 12/30/2022.  This will be monitored on future visits.  It was brought to our attention that immunostains on lung specimen at [facility] showed no reactivity to TTF-1 and there is some discussion whether lung nodules are metastatic from pancreatic primary.  I discussed her case with our pathologist Dr. Ramey  today.  They are in the process of obtaining slides from [facility] and additional information to follow.  Depending on final consensus, if treatment plan needs to be adjusted, we will do so.  This was discussed with patient and her husband today.  They verbalized understanding

## 2023-03-16 ENCOUNTER — Other Ambulatory Visit: Payer: Self-pay

## 2023-03-16 ENCOUNTER — Telehealth: Payer: Self-pay | Admitting: Dietician

## 2023-03-16 DIAGNOSIS — M25551 Pain in right hip: Secondary | ICD-10-CM

## 2023-03-16 NOTE — Telephone Encounter (Signed)
Patient has been scheduled. Aware of appt date and time.   Scheduling Message Entered by NEFF, Denice Bors on 03/15/2023 at 12:57 PM Priority: Routine NUT 45  Department: [facility]  Provider:  Appointment Notes:  Referral from chaplain.  Scheduling Notes:  Please schedule in virtual slot (telephone visit) on Cyndi's schedule Wed afternoon at 2:30. Please call patient to tell her! Thanks!

## 2023-03-17 ENCOUNTER — Other Ambulatory Visit (HOSPITAL_COMMUNITY): Payer: Self-pay | Admitting: Registered Nurse

## 2023-03-17 ENCOUNTER — Telehealth: Payer: Self-pay

## 2023-03-17 ENCOUNTER — Ambulatory Visit: Payer: Self-pay | Admitting: Nurse Practitioner

## 2023-03-17 ENCOUNTER — Inpatient Hospital Stay: Payer: Medicare Other | Admitting: Dietician

## 2023-03-17 DIAGNOSIS — N186 End stage renal disease: Secondary | ICD-10-CM

## 2023-03-17 NOTE — Progress Notes (Signed)
Nutrition Assessment: Reached out to patient at home telephone number.    Reason for Assessment: Patient requested nutrition consult to address her questions.    She a 58 year old female with multiple medical co morbidities including kidney transplant, currently ESRD on HD, congestive heart failure, atrial fibrillation/flutter, COPD, depression, chronic gout, chronic pain, anemia of chronic disease, gastroparesis, midodrine dependent hypotension, history of strokes, suspected mesenteric ischemia, was recently diagnosed with malignant lung nodule and mass in the left insula, consistent with metastatic disease.  She is undergoing SRS for brain mass.  Also has a primary pancreatic malignancy.   She has access to an RD at her dialysis center but she wasn't sure if that RD would know recommendations about cancer.  Her concerns were regarding safety of alcohol. I encouraged abstinence and relayed that is it not recommended.  She also wanted to know if she could have caffeine coffee or had to have decaf.  Reports that her bowels are regular so I didn't feel the need to have her give up caffeine. Her last question involved dark sodas (colas).  I let her know that colas are restricted more due to there phosphorus content and her renal function and not restricted for cancer other than the recommendation to limit sugary beverages which would matter if they were light or dark.  Offer to text contact information and availability if she has future questions she declined need.  Gennaro Africa, RDN, LDN Registered Dietitian, [facility] Part Time Remote (Usual office hours: Tuesday-Thursday) Mobile: [phone] Remote Office: [phone]

## 2023-03-17 NOTE — Telephone Encounter (Signed)
Responded to patient after she had called to report new symptoms following her last chemo tx of:  lower midline abdominal cramps 6/10 for pain scale. She is post menopausal. In addition, bilateral knee aches.   I let patient know that if her symptoms become worse, to reach out to Dr. Zenda Alpers office and if her symptoms become much worse, to go to emergency department. Patient agreed and thanked me for following up.

## 2023-03-18 ENCOUNTER — Encounter (HOSPITAL_COMMUNITY): Payer: Self-pay | Admitting: *Deleted

## 2023-03-18 ENCOUNTER — Encounter: Payer: Self-pay | Admitting: Nurse Practitioner

## 2023-03-18 NOTE — Telephone Encounter (Signed)
Patient called to report abdominal cramping, "Legs not working right," and "Legs shuffling." Dr. Arlana Pouch aware and recommended that patient go to the emergency room. Symptom management aware and also agreed with this advice. Patient said she would go to the emergency room.

## 2023-03-20 ENCOUNTER — Emergency Department (HOSPITAL_COMMUNITY): Payer: Medicare Other

## 2023-03-20 ENCOUNTER — Encounter (HOSPITAL_COMMUNITY): Payer: Self-pay

## 2023-03-20 ENCOUNTER — Observation Stay (HOSPITAL_COMMUNITY)
Admission: EM | Admit: 2023-03-20 | Discharge: 2023-03-21 | Disposition: A | Discharge status: Home / Self Care | Payer: Medicare Other | Attending: Internal Medicine | Admitting: Internal Medicine

## 2023-03-20 ENCOUNTER — Other Ambulatory Visit: Payer: Self-pay

## 2023-03-20 DIAGNOSIS — M4802 Spinal stenosis, cervical region: Secondary | ICD-10-CM | POA: Diagnosis not present

## 2023-03-20 DIAGNOSIS — Z8739 Personal history of other diseases of the musculoskeletal system and connective tissue: Secondary | ICD-10-CM

## 2023-03-20 DIAGNOSIS — C7931 Secondary malignant neoplasm of brain: Secondary | ICD-10-CM | POA: Diagnosis not present

## 2023-03-20 DIAGNOSIS — Z79899 Other long term (current) drug therapy: Secondary | ICD-10-CM | POA: Insufficient documentation

## 2023-03-20 DIAGNOSIS — Z8679 Personal history of other diseases of the circulatory system: Secondary | ICD-10-CM

## 2023-03-20 DIAGNOSIS — C7889 Secondary malignant neoplasm of other digestive organs: Secondary | ICD-10-CM | POA: Diagnosis not present

## 2023-03-20 DIAGNOSIS — R0600 Dyspnea, unspecified: Secondary | ICD-10-CM | POA: Insufficient documentation

## 2023-03-20 DIAGNOSIS — J449 Chronic obstructive pulmonary disease, unspecified: Secondary | ICD-10-CM | POA: Diagnosis not present

## 2023-03-20 DIAGNOSIS — Z8673 Personal history of transient ischemic attack (TIA), and cerebral infarction without residual deficits: Secondary | ICD-10-CM | POA: Diagnosis not present

## 2023-03-20 DIAGNOSIS — Z940 Kidney transplant status: Secondary | ICD-10-CM | POA: Insufficient documentation

## 2023-03-20 DIAGNOSIS — G894 Chronic pain syndrome: Secondary | ICD-10-CM | POA: Insufficient documentation

## 2023-03-20 DIAGNOSIS — D62 Acute posthemorrhagic anemia: Principal | ICD-10-CM | POA: Insufficient documentation

## 2023-03-20 DIAGNOSIS — D631 Anemia in chronic kidney disease: Secondary | ICD-10-CM | POA: Insufficient documentation

## 2023-03-20 DIAGNOSIS — D693 Immune thrombocytopenic purpura: Secondary | ICD-10-CM | POA: Insufficient documentation

## 2023-03-20 DIAGNOSIS — K3184 Gastroparesis: Secondary | ICD-10-CM | POA: Diagnosis not present

## 2023-03-20 DIAGNOSIS — I509 Heart failure, unspecified: Secondary | ICD-10-CM | POA: Insufficient documentation

## 2023-03-20 DIAGNOSIS — N186 End stage renal disease: Secondary | ICD-10-CM | POA: Insufficient documentation

## 2023-03-20 DIAGNOSIS — D649 Anemia, unspecified: Secondary | ICD-10-CM | POA: Diagnosis not present

## 2023-03-20 DIAGNOSIS — C3490 Malignant neoplasm of unspecified part of unspecified bronchus or lung: Secondary | ICD-10-CM | POA: Insufficient documentation

## 2023-03-20 DIAGNOSIS — Z992 Dependence on renal dialysis: Secondary | ICD-10-CM | POA: Diagnosis not present

## 2023-03-20 DIAGNOSIS — J9611 Chronic respiratory failure with hypoxia: Secondary | ICD-10-CM | POA: Insufficient documentation

## 2023-03-20 DIAGNOSIS — I132 Hypertensive heart and chronic kidney disease with heart failure and with stage 5 chronic kidney disease, or end stage renal disease: Secondary | ICD-10-CM | POA: Insufficient documentation

## 2023-03-20 DIAGNOSIS — F32A Depression, unspecified: Secondary | ICD-10-CM | POA: Insufficient documentation

## 2023-03-20 DIAGNOSIS — K219 Gastro-esophageal reflux disease without esophagitis: Secondary | ICD-10-CM | POA: Insufficient documentation

## 2023-03-20 DIAGNOSIS — M533 Sacrococcygeal disorders, not elsewhere classified: Secondary | ICD-10-CM | POA: Diagnosis not present

## 2023-03-20 DIAGNOSIS — D638 Anemia in other chronic diseases classified elsewhere: Secondary | ICD-10-CM | POA: Diagnosis not present

## 2023-03-20 DIAGNOSIS — Z86718 Personal history of other venous thrombosis and embolism: Secondary | ICD-10-CM | POA: Diagnosis not present

## 2023-03-20 DIAGNOSIS — D696 Thrombocytopenia, unspecified: Secondary | ICD-10-CM | POA: Diagnosis not present

## 2023-03-20 DIAGNOSIS — Z87891 Personal history of nicotine dependence: Secondary | ICD-10-CM | POA: Insufficient documentation

## 2023-03-20 DIAGNOSIS — C7800 Secondary malignant neoplasm of unspecified lung: Secondary | ICD-10-CM | POA: Insufficient documentation

## 2023-03-20 DIAGNOSIS — I959 Hypotension, unspecified: Secondary | ICD-10-CM | POA: Insufficient documentation

## 2023-03-20 DIAGNOSIS — Z8616 Personal history of COVID-19: Secondary | ICD-10-CM | POA: Insufficient documentation

## 2023-03-20 DIAGNOSIS — J410 Simple chronic bronchitis: Secondary | ICD-10-CM

## 2023-03-20 DIAGNOSIS — R531 Weakness: Secondary | ICD-10-CM | POA: Diagnosis present

## 2023-03-20 DIAGNOSIS — Z7901 Long term (current) use of anticoagulants: Secondary | ICD-10-CM | POA: Insufficient documentation

## 2023-03-20 DIAGNOSIS — M109 Gout, unspecified: Secondary | ICD-10-CM | POA: Diagnosis present

## 2023-03-20 DIAGNOSIS — M87059 Idiopathic aseptic necrosis of unspecified femur: Secondary | ICD-10-CM | POA: Insufficient documentation

## 2023-03-20 DIAGNOSIS — M8700 Idiopathic aseptic necrosis of unspecified bone: Secondary | ICD-10-CM | POA: Diagnosis not present

## 2023-03-20 DIAGNOSIS — F3289 Other specified depressive episodes: Secondary | ICD-10-CM

## 2023-03-20 DIAGNOSIS — I9589 Other hypotension: Secondary | ICD-10-CM

## 2023-03-20 DIAGNOSIS — C259 Malignant neoplasm of pancreas, unspecified: Secondary | ICD-10-CM | POA: Diagnosis present

## 2023-03-20 DIAGNOSIS — I4891 Unspecified atrial fibrillation: Secondary | ICD-10-CM | POA: Insufficient documentation

## 2023-03-20 LAB — CBC WITH DIFFERENTIAL/PLATELET
Abs Immature Granulocytes: 0.03 10*3/uL (ref 0.00–0.07)
Basophils Absolute: 0 10*3/uL (ref 0.0–0.1)
Basophils Relative: 0 %
Eosinophils Absolute: 0 10*3/uL (ref 0.0–0.5)
Eosinophils Relative: 1 %
HCT: 17.1 % — ABNORMAL LOW (ref 36.0–46.0)
Hemoglobin: 5.5 g/dL — CL (ref 12.0–15.0)
Immature Granulocytes: 1 %
Lymphocytes Relative: 31 %
Lymphs Abs: 0.6 10*3/uL — ABNORMAL LOW (ref 0.7–4.0)
MCH: 28.9 pg (ref 26.0–34.0)
MCHC: 32.2 g/dL (ref 30.0–36.0)
MCV: 90 fL (ref 80.0–100.0)
Monocytes Absolute: 0.1 10*3/uL (ref 0.1–1.0)
Monocytes Relative: 5 %
Neutro Abs: 1.3 10*3/uL — ABNORMAL LOW (ref 1.7–7.7)
Neutrophils Relative %: 62 %
Platelets: 96 10*3/uL — ABNORMAL LOW (ref 150–400)
RBC: 1.9 MIL/uL — ABNORMAL LOW (ref 3.87–5.11)
RDW: 17.2 % — ABNORMAL HIGH (ref 11.5–15.5)
Smear Review: DECREASED
WBC: 2.1 10*3/uL — ABNORMAL LOW (ref 4.0–10.5)
nRBC: 0 % (ref 0.0–0.2)

## 2023-03-20 LAB — BASIC METABOLIC PANEL
Anion gap: 12 (ref 5–15)
BUN: 29 mg/dL — ABNORMAL HIGH (ref 6–20)
CO2: 28 mmol/L (ref 22–32)
Calcium: 8 mg/dL — ABNORMAL LOW (ref 8.9–10.3)
Chloride: 95 mmol/L — ABNORMAL LOW (ref 98–111)
Creatinine, Ser: 3.33 mg/dL — ABNORMAL HIGH (ref 0.44–1.00)
GFR, Estimated: 16 mL/min — ABNORMAL LOW (ref >60)
Glucose, Bld: 80 mg/dL (ref 70–99)
Potassium: 3.6 mmol/L (ref 3.5–5.1)
Sodium: 135 mmol/L (ref 135–145)

## 2023-03-20 LAB — HEPATIC FUNCTION PANEL
ALT: 48 U/L — ABNORMAL HIGH (ref 0–44)
AST: 37 U/L (ref 15–41)
Albumin: 3.1 g/dL — ABNORMAL LOW (ref 3.5–5.0)
Alkaline Phosphatase: 152 U/L — ABNORMAL HIGH (ref 38–126)
Bilirubin, Direct: <0.1 mg/dL (ref 0.0–0.2)
Total Bilirubin: 0.4 mg/dL (ref 0.0–1.2)
Total Protein: 5.6 g/dL — ABNORMAL LOW (ref 6.5–8.1)

## 2023-03-20 LAB — PREPARE RBC (CROSSMATCH)

## 2023-03-20 LAB — BRAIN NATRIURETIC PEPTIDE: B Natriuretic Peptide: 138.9 pg/mL — ABNORMAL HIGH (ref 0.0–100.0)

## 2023-03-20 MED ORDER — ZOLPIDEM TARTRATE 5 MG PO TABS: 5.0000 mg | ORAL_TABLET | Freq: Every evening | ORAL | Status: DC | PRN

## 2023-03-20 MED ORDER — MIDODRINE HCL 5 MG PO TABS: 15.0000 mg | ORAL_TABLET | Freq: Three times a day (TID) | ORAL | Status: DC

## 2023-03-20 MED ORDER — ONDANSETRON HCL 4 MG/2ML IJ SOLN: 4.0000 mg | Freq: Four times a day (QID) | INTRAMUSCULAR | Status: DC | PRN

## 2023-03-20 MED ORDER — SODIUM CHLORIDE 0.9% IV SOLUTION: Freq: Once | INTRAVENOUS | Status: AC

## 2023-03-20 MED ORDER — LACTULOSE 10 GM/15ML PO SOLN: 20.0000 g | Freq: Two times a day (BID) | ORAL | Status: DC | PRN

## 2023-03-20 MED ORDER — SODIUM CHLORIDE 0.9 % IV SOLN: 250.0000 mL | INTRAVENOUS | Status: DC | PRN

## 2023-03-20 MED ORDER — ACETAMINOPHEN 325 MG PO TABS
650.0000 mg | ORAL_TABLET | Freq: Four times a day (QID) | ORAL | Status: DC | PRN
  Filled 2023-03-20: qty 2

## 2023-03-20 MED ORDER — OXYCODONE-ACETAMINOPHEN 5-325 MG PO TABS
1.0000 | ORAL_TABLET | Freq: Four times a day (QID) | ORAL | Status: DC | PRN
  Administered 2023-03-20 – 2023-03-21 (×3): 1 via ORAL
  Filled 2023-03-20 (×3): qty 1

## 2023-03-20 MED ORDER — SENNOSIDES-DOCUSATE SODIUM 8.6-50 MG PO TABS
1.0000 | ORAL_TABLET | Freq: Every day | ORAL | Status: DC
  Filled 2023-03-20: qty 1

## 2023-03-20 MED ORDER — MOMETASONE FURO-FORMOTEROL FUM 200-5 MCG/ACT IN AERO
2.0000 | INHALATION_SPRAY | Freq: Two times a day (BID) | RESPIRATORY_TRACT | Status: DC
  Filled 2023-03-20 (×2): qty 8.8

## 2023-03-20 MED ORDER — SODIUM CHLORIDE 0.9% FLUSH: 3.0000 mL | INTRAVENOUS | Status: DC | PRN

## 2023-03-20 MED ORDER — FERRIC CITRATE 1 GM 210 MG(FE) PO TABS
420.0000 mg | ORAL_TABLET | Freq: Three times a day (TID) | ORAL | Status: DC
  Administered 2023-03-21: 420 mg via ORAL
  Filled 2023-03-20: qty 2

## 2023-03-20 MED ORDER — ALBUTEROL SULFATE (2.5 MG/3ML) 0.083% IN NEBU
2.5000 mg | INHALATION_SOLUTION | Freq: Once | RESPIRATORY_TRACT | Status: AC
  Administered 2023-03-20: 2.5 mg via RESPIRATORY_TRACT
  Filled 2023-03-20: qty 3

## 2023-03-20 MED ORDER — SODIUM CHLORIDE 0.9% FLUSH
3.0000 mL | Freq: Two times a day (BID) | INTRAVENOUS | Status: DC
  Administered 2023-03-21 (×2): 3 mL via INTRAVENOUS

## 2023-03-20 MED ORDER — MIDODRINE HCL 5 MG PO TABS
15.0000 mg | ORAL_TABLET | Freq: Three times a day (TID) | ORAL | Status: DC
  Administered 2023-03-20 – 2023-03-21 (×2): 15 mg via ORAL
  Filled 2023-03-20 (×2): qty 3

## 2023-03-20 MED ORDER — OXYCODONE-ACETAMINOPHEN 10-325 MG PO TABS: 1.0000 | ORAL_TABLET | Freq: Four times a day (QID) | ORAL | Status: DC | PRN

## 2023-03-20 MED ORDER — METOCLOPRAMIDE HCL 10 MG PO TABS
10.0000 mg | ORAL_TABLET | Freq: Every day | ORAL | Status: DC
  Administered 2023-03-20: 10 mg via ORAL
  Filled 2023-03-20: qty 1

## 2023-03-20 MED ORDER — FAMOTIDINE 20 MG PO TABS
20.0000 mg | ORAL_TABLET | Freq: Two times a day (BID) | ORAL | Status: DC
  Administered 2023-03-20 – 2023-03-21 (×2): 20 mg via ORAL
  Filled 2023-03-20 (×2): qty 1

## 2023-03-20 MED ORDER — MIDODRINE HCL 5 MG PO TABS
15.0000 mg | ORAL_TABLET | Freq: Three times a day (TID) | ORAL | Status: DC
  Administered 2023-03-20: 15 mg via ORAL
  Filled 2023-03-20: qty 3

## 2023-03-20 MED ORDER — SODIUM CHLORIDE 0.9% IV SOLUTION: Freq: Once | INTRAVENOUS | Status: DC

## 2023-03-20 MED ORDER — OXYCODONE HCL 5 MG PO TABS
5.0000 mg | ORAL_TABLET | Freq: Four times a day (QID) | ORAL | Status: DC | PRN
  Administered 2023-03-21: 5 mg via ORAL
  Filled 2023-03-20: qty 1

## 2023-03-20 MED ORDER — HYDROMORPHONE HCL 1 MG/ML IJ SOLN
0.5000 mg | Freq: Once | INTRAMUSCULAR | Status: AC
  Administered 2023-03-20: 0.5 mg via INTRAVENOUS
  Filled 2023-03-20: qty 1

## 2023-03-20 MED ORDER — DEXAMETHASONE 4 MG PO TABS
2.0000 mg | ORAL_TABLET | Freq: Every day | ORAL | Status: DC
  Administered 2023-03-21: 2 mg via ORAL
  Filled 2023-03-20 (×2): qty 1

## 2023-03-20 MED ORDER — SERTRALINE HCL 50 MG PO TABS
25.0000 mg | ORAL_TABLET | Freq: Every day | ORAL | Status: DC
Start: 2023-03-20 — End: 2023-03-21
  Administered 2023-03-20: 25 mg via ORAL
  Filled 2023-03-20: qty 1

## 2023-03-20 MED ORDER — SODIUM CHLORIDE 0.9% FLUSH
3.0000 mL | Freq: Two times a day (BID) | INTRAVENOUS | Status: DC
  Administered 2023-03-21: 3 mL via INTRAVENOUS

## 2023-03-20 MED ORDER — DEXAMETHASONE 4 MG PO TABS: 4.0000 mg | ORAL_TABLET | Freq: Two times a day (BID) | ORAL | Status: DC

## 2023-03-20 MED ORDER — ACETAMINOPHEN 650 MG RE SUPP: 650.0000 mg | Freq: Four times a day (QID) | RECTAL | Status: DC | PRN

## 2023-03-20 MED ORDER — ONDANSETRON HCL 4 MG PO TABS: 4.0000 mg | ORAL_TABLET | Freq: Four times a day (QID) | ORAL | Status: DC | PRN

## 2023-03-20 MED ORDER — AMIODARONE HCL 200 MG PO TABS
100.0000 mg | ORAL_TABLET | Freq: Every day | ORAL | Status: DC
  Administered 2023-03-20 – 2023-03-21 (×2): 100 mg via ORAL
  Filled 2023-03-20 (×2): qty 1

## 2023-03-20 MED ORDER — CYCLOBENZAPRINE HCL 10 MG PO TABS
5.0000 mg | ORAL_TABLET | Freq: Every day | ORAL | Status: DC
  Filled 2023-03-20: qty 1

## 2023-03-20 MED ORDER — OYSTER SHELL CALCIUM/D3 500-5 MG-MCG PO TABS
1.0000 | ORAL_TABLET | Freq: Two times a day (BID) | ORAL | Status: DC
  Filled 2023-03-20 (×3): qty 1

## 2023-03-20 MED ORDER — IPRATROPIUM-ALBUTEROL 0.5-2.5 (3) MG/3ML IN SOLN: 3.0000 mL | RESPIRATORY_TRACT | Status: DC | PRN

## 2023-03-20 MED ORDER — GABAPENTIN 100 MG PO CAPS: 100.0000 mg | ORAL_CAPSULE | Freq: Three times a day (TID) | ORAL | Status: DC | PRN

## 2023-03-20 NOTE — ED Provider Notes (Signed)
[facility] EMERGENCY DEPARTMENT AT [facility] Provider Note   CSN: [id number] Arrival date & time: 03/20/23  1450     History  Chief Complaint  Patient presents with   Anemia    Tina Watkins is a 58 y.o. female with PMH as listed below who presents with anemia.  Pt had labs drawn Thursday and was told to come in for a blood transfusion. Pt on 4L [place] at baseline.  Patient was recently admitted in January for ongoing abdominal pain nausea vomiting.  Patient has extensive past medical history including ESRD on hemodialysis TuThSat , A-fib flutter, COPD, depression, chronic gout, chronic pain syndrome, anemia of chronic renal disease, gastroparesis, midodrine dependent hypertension, recent stroke, history of pancreatic cancer and lung cancer who received first dose of chemotherapy approximately 3 to 4 weeks ago.  Patient denies any abdominal pain nausea vomiting here today.  Also denies any chest pain.  She endorses that she always feels short of breath and has had some cough that is not productive of any blood or green sputum.  Denies any fevers chills.  Denies urinary symptoms vaginal symptoms, denies diarrhea.  Patient tells me that she had been on Neupogen for anemia and low counts but had to stop it during her chemotherapy.  She denies any hematochezia, melena, any bleeding from anywhere.  She endorses fatigue and exhaustion.  Also complains of pain around her coccyx area for the last 2 weeks that was atraumatic.  It is not radiating, not associate with any lower extremity numbness tingling or weakness but states that it is worsening and severe. On eliquis for Afib.  Last dialysis was yesterday and got a full session.   Past Medical History:  Diagnosis Date   Anxiety    Bacteremia due to Gram-negative bacteria 05/23/2011   Blind    right eye   Blind right eye    CHF (congestive heart failure) (HCC)    Chronic lower back pain    Complication of anesthesia    "woke up  during OR; I have an extremely high tolerance" (12/11/2011) 1 procedure was graft; the other procedures were procedures that are typically done with sedation.   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   Dysrhythmia    "tachycardia" (12/11/2011) new onset afib 10/15/14 EKG   E coli bacteremia 06/18/2011   Elevated LDL cholesterol level 12/2018   ESRD (end stage renal disease) (HCC) 06/12/2011   Tues-Thurs-Sat dialysis   Fibromyalgia    Gastroparesis    GERD (gastroesophageal reflux disease)    Glaucoma    right eye   Gout    H/O carpal tunnel syndrome    Headache 10/2019   no current problem per patient on 11/12/22   Headache(784.0)    "not often anymore" (12/11/2011)   Herpes genitalia 1994   History of blood transfusion    "more than a few times" (12/11/2011)   History of COVID-19, most recently 09/2022 05/09/2019   History of deep vein thrombosis (DVT) of right lower extremity 11/20/2017   History of stomach ulcers    Hypotension    Iron deficiency anemia    New onset a-fib (HCC)    10/15/14 EKG   Osteopenia    Peripheral neuropathy 11/2018   Pneumonia    x several but years ago   Pressure ulcer of sacral region, stage 1 07/2019   Seizures (HCC) 1994   "post transplant; only have had  that one" (12/11/2011)   Spinal stenosis in cervical region    Stroke [facility])     left basal ganglia lacunar infarct; Right frontal lobe lacunar infarct.   Stroke [facility]) ~ 1999; 2001   "briefly lost my vision; lost my right eye" (12/11/2011)   Vitamin D deficiency 12/2018       Home Medications Prior to Admission medications   Medication Sig Start Date End Date Taking? Authorizing Provider  allopurinol (ZYLOPRIM) 100 MG tablet Take 0.5 tablets (50 mg total) by mouth 2 (two) times a week. Patient taking differently: Take 100 mg by mouth daily in the afternoon. 12/24/22   Tawkaliyar, Roya, DO  amiodarone (PACERONE) 200 MG tablet Take 0.5  tablets (100 mg total) by mouth daily. 07/03/22   Eustace Pen, PA-C  apixaban (ELIQUIS) 2.5 MG TABS tablet Take 1 tablet (2.5 mg total) by mouth 2 (two) times daily. Okay to restart this medication on 11/18/2022 11/16/22   Leslye Peer, MD  AURYXIA 1 GM 210 MG(Fe) tablet Take 420 mg by mouth 3 (three) times daily.    [provider]  Colchicine 0.6 MG CAPS Take 0.6 mg by mouth daily as needed (gout flare up). 01/12/22   Ivonne Andrew, NP  cyclobenzaprine (FLEXERIL) 5 MG tablet Take 1 tablet (5 mg total) by mouth at bedtime as needed. 08/21/22   Rice, Jamesetta Orleans, MD  dexamethasone (DECADRON) 4 MG tablet Take 2 tablets (8mg ) by mouth daily starting the day after carboplatin for 3 days. Take with food 01/20/23   Pasam, Avinash, MD  dexamethasone (DECADRON) 4 MG tablet Take 1 tablet (4 mg total) by mouth 2 (two) times daily with a meal. 02/15/23   Pasam, Avinash, MD  diclofenac Sodium (VOLTAREN) 1 % GEL Apply 4 g topically as needed. 06/22/22   Ivonne Andrew, NP  diphenhydrAMINE (BENADRYL) 25 mg capsule Take 1 capsule (25 mg total) by mouth every 8 (eight) hours as needed. 07/21/19   Kallie Locks, FNP  dorzolamide-timolol (COSOPT) 2-0.5 % ophthalmic solution Place 1 drop into the left eye 2 (two) times daily. 12/13/21   [provider]  famotidine (PEPCID) 20 MG tablet Take 1 tablet (20 mg total) by mouth 2 (two) times daily. 03/15/23   Ivonne Andrew, NP  fluticasone-salmeterol (ADVAIR HFA) 409-81 MCG/ACT inhaler Inhale 2 puffs into the lungs 2 (two) times daily. 04/28/21   Martina Sinner, MD  gabapentin (NEURONTIN) 100 MG capsule Take 100 mg by mouth 3 (three) times daily as needed (nerve pain).    [provider]  heparin sodium, porcine, 1000 UNIT/ML injection Inject 1,000 Units into the peritoneum once. 09/03/22 09/02/23  [provider]  Lactulose 20 GM/30ML SOLN Take 30 mLs (20 g total) by mouth 2 (two) times daily as needed (constipation).  03/01/23   Dorcas Carrow, MD  metoCLOPramide (REGLAN) 10 MG tablet Take 1 tablet (10 mg total) by mouth 3 (three) times daily before meals. Patient taking differently: Take 10 mg by mouth daily in the afternoon. 12/02/22   Meryl Dare, MD  midodrine (PROAMATINE) 10 MG tablet TAKE 1.5 TABLETS (15 MG TOTAL) BY MOUTH 3 (THREE) TIMES DAILY. NEEDS FOLLOW UP APPOINTMENT FOR ANYMORE REFILLS 01/18/23   Bensimhon, Bevelyn Buckles, MD  omeprazole (PRILOSEC) 40 MG capsule TAKE 1 CAPSULE (40 MG TOTAL) BY MOUTH DAILY. 01/15/23   Meryl Dare, MD  ondansetron (ZOFRAN) 8 MG tablet Take 1 tablet (8 mg total) by mouth every 8 (eight) hours as  needed for nausea or vomiting. Start on the third day after carboplatin. 01/20/23   Pasam, Archie Patten, MD  oxyCODONE-acetaminophen (PERCOCET) 10-325 MG tablet Take 1 tablet by mouth 4 (four) times daily as needed for pain. 09/08/19   [provider]  PRESCRIPTION MEDICATION Take 1 tablet by mouth 2 (two) times daily. Unknown name of medication for brain tumor.    [provider]  senna-docusate (SENOKOT-S) 8.6-50 MG tablet Take 1 tablet by mouth at bedtime. 03/01/23   Dorcas Carrow, MD  sertraline (ZOLOFT) 25 MG tablet Take 1 tablet (25 mg total) by mouth daily. 01/08/23   Ivonne Andrew, NP  VELPHORO 500 MG chewable tablet Taking 500 mg by mouth per meal or snack    [provider]  zolpidem (AMBIEN) 10 MG tablet Take 1 tablet (10 mg total) by mouth at bedtime as needed for sleep. 08/24/19   Maurilio Lovely D, DO      Allergies    Levofloxacin and Tape    Review of Systems   Review of Systems A 10 point review of systems was performed and is negative unless otherwise reported in HPI.  Physical Exam Updated Vital Signs BP 104/73   Pulse 96   Temp 97.9 F (36.6 C)   Resp (!) 24   SpO2 100%  Physical Exam General: Chronically ill-appearing female, lying in bed.  HEENT: Sclera anicteric, MMM, trachea midline.  Cardiology: RRR, no  murmurs/rubs/gallops.  Resp: On 4L [place]. Mild tachypnea, decreased breath sounds on the left, otherwise no wheezing/crackles Abd: Soft, non-tender, non-distended. No rebound tenderness or guarding.  GU: Deferred. MSK: No peripheral edema or signs of trauma. Extremities without deformity or TTP.  Skin: warm, dry Back: No CVA tenderness Neuro: A&Ox4, CNs II-XII grossly intact. MAEs. Sensation grossly intact.  Psych: Normal mood and affect.   ED Results / Procedures / Treatments   Labs (all labs ordered are listed, but only abnormal results are displayed) Labs Reviewed  CBC WITH DIFFERENTIAL/PLATELET - Abnormal; Notable for the following components:      Result Value   WBC 2.1 (*)    RBC 1.90 (*)    Hemoglobin 5.5 (*)    HCT 17.1 (*)    RDW 17.2 (*)    Platelets 96 (*)    Neutro Abs 1.3 (*)    Lymphs Abs 0.6 (*)    All other components within normal limits  BASIC METABOLIC PANEL - Abnormal; Notable for the following components:   Chloride 95 (*)    BUN 29 (*)    Creatinine, Ser 3.33 (*)    Calcium 8.0 (*)    GFR, Estimated 16 (*)    All other components within normal limits  HEPATIC FUNCTION PANEL - Abnormal; Notable for the following components:   Total Protein 5.6 (*)    Albumin 3.1 (*)    ALT 48 (*)    Alkaline Phosphatase 152 (*)    All other components within normal limits  BRAIN NATRIURETIC PEPTIDE - Abnormal; Notable for the following components:   B Natriuretic Peptide 138.9 (*)    All other components within normal limits  TYPE AND SCREEN  PREPARE RBC (CROSSMATCH)    EKG None  Radiology CT PELVIS WO CONTRAST Result Date: 03/20/2023 CLINICAL DATA:  coccyx/sacral TTP, midline Review of radiologic records indicates history of stage IV lung cancer. EXAM: CT PELVIS WITHOUT CONTRAST TECHNIQUE: Multidetector CT imaging of the pelvis was performed following the standard protocol without intravenous contrast. RADIATION DOSE REDUCTION: This exam  was performed according to  the departmental dose-optimization program which includes automated exposure control, adjustment of the mA and/or kV according to patient size and/or use of iterative reconstruction technique. COMPARISON:  CT 02/27/2023 FINDINGS: Urinary Tract: Decompressed urinary bladder, diffuse bladder wall thickening. 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 Bowel: No inflammation of pelvic bowel loops. Moderate volume of colonic stool. Vascular/Lymphatic: Extensive arterial vascular calcifications. Right-sided dialysis catheter, extending beyond the field of view. No pelvic adenopathy. Reproductive: Soft tissue prominence in the region of the cervix. Otherwise unremarkable. Other:  No pelvic ascites.  Mild generalized body wall edema. 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 No evidence of fracture or focal bone lesion. No bony destructive change. No intramuscular collection in the absence of IV contrast. IMPRESSION: 1. No evidence of metastatic disease in the pelvis. No explanation for sacrococcygeal tenderness. 2. Soft tissue prominence in the region of the cervix, recommend correlation with physical exam. 3. 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 4. Right femoral head avascular necrosis. 5. Mild generalized body wall edema. Electronically Signed   By: Narda Rutherford M.D.   On: 03/20/2023 18:52   DG Chest Portable 1 View Result Date: 03/20/2023 CLINICAL DATA:  Dyspnea EXAM: PORTABLE CHEST 1 VIEW COMPARISON:  02/27/2023. FINDINGS: Left hemithorax is opacified. Pulmonary interstitial prominence with a nodular configuration of the right hemithorax. Metastatic disease not excluded. No pneumothorax. Stent overlies the right hilar region. Osseous structures appear intact. IMPRESSION: Nodular interstitial process on the right. Completely opacified left hemithorax. Electronically Signed   By: Layla Maw M.D.   On: 03/20/2023 18:27     Procedures .Critical Care  Performed by: Loetta Rough, MD Authorized by: Loetta Rough, MD   Critical care provider statement:    Critical care time (minutes):  30   Critical care was necessary to treat or prevent imminent or life-threatening deterioration of the following conditions: anemia.   Critical care was time spent personally by me on the following activities:  Development of treatment plan with patient or surrogate, discussions with consultants, evaluation of patient's response to treatment, examination of patient, ordering and review of laboratory studies, ordering and review of radiographic studies, ordering and performing treatments and interventions, pulse oximetry, re-evaluation of patient's condition, review of old charts and obtaining history from patient or surrogate   Care discussed with: admitting provider       Medications Ordered in ED Medications  0.9 %  sodium chloride infusion (Manually program via Guardrails IV Fluids) (has no administration in time range)  midodrine (PROAMATINE) tablet 15 mg (15 mg Oral Given 03/20/23 1750)  albuterol (PROVENTIL) (2.5 MG/3ML) 0.083% nebulizer solution 2.5 mg (has no administration in time range)  HYDROmorphone (DILAUDID) injection 0.5 mg (0.5 mg Intravenous Given 03/20/23 1750)    ED Course/ Medical Decision Making/ A&P                          Medical Decision Making Amount and/or Complexity of Data Reviewed Labs: ordered. Decision-making details documented in ED Course. Radiology: ordered. Decision-making details documented in ED Course.  Risk Prescription drug management. Decision regarding hospitalization.    This patient presents to the ED for concern of anemia, this involves an extensive number of treatment options, and is a complaint that carries with it a high risk of complications and morbidity.  I considered the following differential and admission for this acute, potentially life threatening condition.    MDM:  Patient with history of anemia of chronic disease previously on Neupogen but recently discontinued from that due to chemotherapy presents today with outpatient labs demonstrating anemia.  Hemoglobin today 5.5 down from 8.68 days ago.  Patient continues to deny any GI bleeding or any bleeding elsewhere.  Her white count is also down to 2.1 and her platelets are low at 96 but she does have chronic thrombocytopenia.  Patient will need 2 units irradiated blood cells we will order those today.  Patient does report intermittent shortness of breath but she is a completely opacified left hemithorax which is baseline for her and has chronic respiratory failure at baseline.  She does not have any peripheral edema or indication of volume overload on exam, states she has 1 kg extra on based on her weight yesterday but got a full session of dialysis yesterday.  I do believe that her bleeding her with the blood that she needs will add significant volume and may worsen her respiratory status, she will need monitoring for this.  Patient also complains of atraumatic coccyx pain, will obtain CT to evaluate for possible pathologic fracture, however she has no red flag symptoms from a back pain perspective.   Clinical Course as of 03/20/23 1939  Sat Mar 20, 2023  1556 Hemoglobin(!!): 5.5 Dropped from 8.6 eight days ago [HN]  1613 Basic metabolic panel(!) Unremarkable in the context of this patient's presentation  [HN]  1857 CT PELVIS WO CONTRAST 1. No evidence of metastatic disease in the pelvis. No explanation for sacrococcygeal tenderness. 2. Soft tissue prominence in the region of the cervix, recommend correlation with physical exam. 3. 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 4. Right femoral head avascular necrosis. 5. Mild generalized body wall edema.   [HN]  1858 DG Chest Portable 1 View Nodular interstitial process on the right. Completely opacified left hemithorax.    [HN]  1902 Patient's chest x-ray with similar opacified left hemithorax.  She says states she feels mildly short of breath and would like to try a nebulizer as she has heard of those before.  I discussed that it likely will not help but we can try if she would like to. [HN]    Clinical Course User Index [HN] Loetta Rough, MD    Labs: I Ordered, and personally interpreted labs.  The pertinent results include: Those listed above  Imaging Studies ordered: I ordered imaging studies including chest x-ray, CT pelvis I independently visualized and interpreted imaging. I agree with the radiologist interpretation  Additional history obtained from chart review, fianc at bedside.    Cardiac Monitoring: The patient was maintained on a cardiac monitor.  I personally viewed and interpreted the cardiac monitored which showed an underlying rhythm of: NSR  Reevaluation: After the interventions noted above, I reevaluated the patient and found that they have :improved  Social Determinants of Health: Lives independently  Disposition:  Admit to medicine  Co morbidities that complicate the patient evaluation  Past Medical History:  Diagnosis Date   Anxiety    Bacteremia due to Gram-negative bacteria 05/23/2011   Blind    right eye   Blind right eye    CHF (congestive heart failure) (HCC)    Chronic lower back pain    Complication of anesthesia    "woke up during OR; I have an extremely high tolerance" (12/11/2011) 1 procedure was graft; the other procedures were procedures that are typically done with sedation.   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   Dysrhythmia    "tachycardia" (12/11/2011) new onset afib 10/15/14 EKG   E coli bacteremia 06/18/2011   Elevated LDL cholesterol level 12/2018   ESRD (end stage renal disease) (HCC) 06/12/2011   Tues-Thurs-Sat dialysis   Fibromyalgia    Gastroparesis    GERD (gastroesophageal  reflux disease)    Glaucoma    right eye   Gout    H/O carpal tunnel syndrome    Headache 10/2019   no current problem per patient on 11/12/22   Headache(784.0)    "not often anymore" (12/11/2011)   Herpes genitalia 1994   History of blood transfusion    "more than a few times" (12/11/2011)   History of COVID-19, most recently 09/2022 05/09/2019   History of deep vein thrombosis (DVT) of right lower extremity 11/20/2017   History of stomach ulcers    Hypotension    Iron deficiency anemia    New onset a-fib (HCC)    10/15/14 EKG   Osteopenia    Peripheral neuropathy 11/2018   Pneumonia    x several but years ago   Pressure ulcer of sacral region, stage 1 07/2019   Seizures (HCC) 1994   "post transplant; only have had that one" (12/11/2011)   Spinal stenosis in cervical region    Stroke [facility])     left basal ganglia lacunar infarct; Right frontal lobe lacunar infarct.   Stroke [facility]) ~ 1999; 2001   "briefly lost my vision; lost my right eye" (12/11/2011)   Vitamin D deficiency 12/2018     Medicines Meds ordered this encounter  Medications   0.9 %  sodium chloride infusion (Manually program via Guardrails IV Fluids)   HYDROmorphone (DILAUDID) injection 0.5 mg   DISCONTD: midodrine (PROAMATINE) tablet 15 mg   midodrine (PROAMATINE) tablet 15 mg   albuterol (PROVENTIL) (2.5 MG/3ML) 0.083% nebulizer solution 2.5 mg    I have reviewed the patients home medicines and have made adjustments as needed  Problem List / ED Course: Problem List Items Addressed This Visit   None Visit Diagnoses       Symptomatic anemia    -  Primary     Dyspnea, unspecified type         Coccyx pain       Relevant Medications   HYDROmorphone (DILAUDID) injection 0.5 mg (Completed)                   This note was created using dictation software, which may contain spelling or grammatical errors.    Loetta Rough, MD 03/20/23 [id number]

## 2023-03-20 NOTE — ED Provider Triage Note (Signed)
Emergency Medicine Provider Triage Evaluation Note  Tina Watkins , a 58 y.o. female  was evaluated in triage.  Pt complains of being anemic.  Pt told her hemoglobin is low and that she needs a transfusion   Review of Systems  Positive: Weakness  Negative: fever  Physical Exam  BP 96/61 (BP Location: Right Arm)   Pulse 97   Temp 97.9 F (36.6 C)   Resp (!) 22   SpO2 100%  Gen:   Awake, no distress   Resp:  Normal effort  MSK:   Moves extremities without difficulty  Other:    Medical Decision Making  Medically screening exam initiated at 3:08 PM.  Appropriate orders placed.  [name] was informed that the remainder of the evaluation will be completed by another provider, this initial triage assessment does not replace that evaluation, and the importance of remaining in the ED until their evaluation is complete.     Elson Areas, [place] 03/20/23 [phone]

## 2023-03-20 NOTE — ED Notes (Signed)
 Patient transported to CT

## 2023-03-20 NOTE — ED Triage Notes (Signed)
Pt had labs drawn Thursday and was told to come in for a blood transfusion due to hgb 7, pt get dialysis t/th/sat, had full treatment today. Pt c.o fatigue and weakness. Pt on 4L [place] at baseline

## 2023-03-20 NOTE — H&P (Signed)
History and Physical    Tina Watkins:[id number] DOB: [birth year] DOA: 03/20/2023  PCP: Ivonne Andrew, NP   Patient coming from: Home   Chief Complaint:  Chief Complaint  Patient presents with   Anemia   ED TRIAGE note:Pt had labs drawn Thursday and was told to come in for a blood transfusion due to hgb 7, pt get dialysis t/th/sat, had full treatment today. Pt c.o fatigue and weakness. Pt on 4L [place] at baseline   HPI:  Tina Watkins is a 58 y.o. female with medical history significant of lung adenocarcinoma diagnosed in December 2024 follows oncology with Dr. 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 Does not have any active source of bleeding.  Denies any hemoptysis, melena, and fresh blood per rectum. She is complaining about generalized fatigue/tiredness. Patient denies any chest pain, abdominal pain, nausea, vomiting, headache, dizziness, and blurry vision..  ED Course:  At presentation to ED patient is hemodynamically stable.  O2 sat 100% for liter oxygen.  Blood pressure is borderline soft. Low hematocrit 17, normal MCV 90.  CBC showing hemoglobin 5.5, baseline hemoglobin variable between 7.4-9. BMP showing evidence of chronic CKD otherwise unremarkable. Chronically elevated ALT 48, alkaline phosphatase 152.  Low albumin 3.1. Chronically elevated BNP 138.  Chest x-ray showed nodular interstitial process of the right and complete opacification of the left hemithorax.  CT pelvis without contrast no evidence of  metastatic disease in the pelvis.  Soft tissue prominence in the region of the cervix.  Right lower quadrant renal transplant without hydronephrosis, right femoral neck Vascular necrosis.  In the ED 2 unit of blood transfusion has been ordered.  Hospitalist has been contacted for further evaluation management of acute on chronic development of anemia.  Significant labs in the ED: Lab Orders         CBC with Differential         Basic metabolic panel         Hepatic function panel         Brain natriuretic peptide         Comprehensive metabolic panel         CBC         Hemoglobin and hematocrit, blood         Occult blood card to lab, stool       Review of Systems:  Review of Systems  Constitutional:  Positive for malaise/fatigue. Negative for chills, fever and weight loss.  Eyes:  Negative for blurred vision and double vision.  Respiratory:  Negative for cough and shortness of breath.   Cardiovascular:  Negative for chest pain, palpitations, orthopnea and leg swelling.  Gastrointestinal:  Negative for abdominal pain, blood in stool, constipation, diarrhea, heartburn, melena, nausea and vomiting.  Genitourinary:  Negative for dysuria.  Musculoskeletal:  Positive for back pain. Negative for falls, joint pain, myalgias and neck pain.  Neurological:  Negative for dizziness, weakness and headaches.  Endo/Heme/Allergies:  Negative for environmental allergies. Does not bruise/bleed easily.  Psychiatric/Behavioral:  The patient is not nervous/anxious.   All other systems reviewed and are negative.   Past Medical  History:  Diagnosis Date   Anxiety    Bacteremia due to Gram-negative bacteria 05/23/2011   Blind    right eye   Blind right eye    CHF (congestive heart failure) (HCC)    Chronic lower back pain    Complication of anesthesia    "woke up during OR; I have an extremely high tolerance" (12/11/2011) 1 procedure was graft; the other procedures were procedures that are  typically done with sedation.   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   Dysrhythmia    "tachycardia" (12/11/2011) new onset afib 10/15/14 EKG   E coli bacteremia 06/18/2011   Elevated LDL cholesterol level 12/2018   ESRD (end stage renal disease) (HCC) 06/12/2011   Tues-Thurs-Sat dialysis   Fibromyalgia    Gastroparesis    GERD (gastroesophageal reflux disease)    Glaucoma    right eye   Gout    H/O carpal tunnel syndrome    Headache 10/2019   no current problem per patient on 11/12/22   Headache(784.0)    "not often anymore" (12/11/2011)   Herpes genitalia 1994   History of blood transfusion    "more than a few times" (12/11/2011)   History of COVID-19, most recently 09/2022 05/09/2019   History of deep vein thrombosis (DVT) of right lower extremity 11/20/2017   History of stomach ulcers    Hypotension    Iron deficiency anemia    New onset a-fib (HCC)    10/15/14 EKG   Osteopenia    Peripheral neuropathy 11/2018   Pneumonia    x several but years ago   Pressure ulcer of sacral region, stage 1 07/2019   Seizures (HCC) 1994   "post transplant; only have had that one" (12/11/2011)   Spinal stenosis in cervical region    Stroke [facility])     left basal ganglia lacunar infarct; Right frontal lobe lacunar infarct.   Stroke [facility]) ~ 1999; 2001   "briefly lost my vision; lost my right eye" (12/11/2011)   Vitamin D deficiency 12/2018    Past Surgical History:  Procedure Laterality Date   ANTERIOR CERVICAL DECOMP/DISCECTOMY FUSION N/A 01/08/2015   Procedure: Anterior Cervical Three-Four/Four-Five Decompression/Diskectomy/Fusion;  Surgeon: Hilda Lias, MD;  Location: [facility] NEURO ORS;  Service: Neurosurgery;  Laterality: N/A;  C3-4 C4-5 Anterior cervical decompression/diskectomy/fusion   APPENDECTOMY  ~ 2004   BIOPSY  09/12/2020   Procedure: BIOPSY;  Surgeon: Rachael Fee, MD;  Location: [facility] ENDOSCOPY;  Service: Endoscopy;;    BRONCHIAL BIOPSY  11/16/2022   Procedure: BRONCHIAL BIOPSIES;  Surgeon: Leslye Peer, MD;  Location: [facility] ENDOSCOPY;  Service: Pulmonary;;   BRONCHIAL BRUSHINGS  11/16/2022   Procedure: BRONCHIAL BRUSHINGS;  Surgeon: Leslye Peer, MD;  Location: [facility] ENDOSCOPY;  Service: Pulmonary;;   BRONCHIAL NEEDLE ASPIRATION BIOPSY  11/16/2022   Procedure: BRONCHIAL NEEDLE ASPIRATION BIOPSIES;  Surgeon: Leslye Peer, MD;  Location: [facility] ENDOSCOPY;  Service: Pulmonary;;   CATARACT EXTRACTION     right eye   COLONOSCOPY     COLONOSCOPY WITH PROPOFOL N/A 09/12/2020   Procedure: COLONOSCOPY WITH PROPOFOL;  Surgeon: Rachael Fee, MD;  Location: [facility] ENDOSCOPY;  Service: Endoscopy;  Laterality: N/A;   ENUCLEATION  2001   "right"   ESOPHAGOGASTRODUODENOSCOPY (EGD) WITH PROPOFOL N/A 04/21/2012   Procedure: ESOPHAGOGASTRODUODENOSCOPY (EGD) WITH PROPOFOL;  Surgeon: Rachael Fee, MD;  Location: [facility] ENDOSCOPY;  Service: Endoscopy;  Laterality: N/A;   ESOPHAGOGASTRODUODENOSCOPY (EGD) WITH PROPOFOL  N/A 09/12/2020   Procedure: ESOPHAGOGASTRODUODENOSCOPY (EGD) WITH PROPOFOL;  Surgeon: Rachael Fee, MD;  Location: [facility] ENDOSCOPY;  Service: Endoscopy;  Laterality: N/A;   FIDUCIAL MARKER PLACEMENT  11/16/2022   Procedure: FIDUCIAL MARKER PLACEMENT;  Surgeon: Leslye Peer, MD;  Location: [facility] ENDOSCOPY;  Service: Pulmonary;;   HEMOSTASIS CONTROL  11/16/2022   Procedure: HEMOSTASIS CONTROL;  Surgeon: Leslye Peer, MD;  Location: [facility] ENDOSCOPY;  Service: Pulmonary;;   INSERTION OF DIALYSIS CATHETER  1988   "AV graft LUA & LFA; LUA worked for 1 day; LFA never worked"   IR FLUORO GUIDE CV LINE RIGHT  07/11/2019   IR FLUORO GUIDE CV LINE RIGHT  10/20/2019   IR FLUORO GUIDE CV LINE RIGHT  05/31/2020   IR PTA VENOUS EXCEPT DIALYSIS CIRCUIT  05/31/2020   IR RADIOLOGY PERIPHERAL GUIDED IV START  07/11/2019   IR US GUIDE VASC ACCESS RIGHT  07/11/2019   IR US GUIDE VASC ACCESS RIGHT  07/11/2019   IR US GUIDE VASC ACCESS RIGHT   07/11/2019   IR VENOCAVAGRAM IVC  05/31/2020   KIDNEY TRANSPLANT  1994; 1999; 2005   "right"   MULTIPLE TOOTH EXTRACTIONS     POLYPECTOMY  09/12/2020   Procedure: POLYPECTOMY;  Surgeon: Rachael Fee, MD;  Location: [facility] ENDOSCOPY;  Service: Endoscopy;;   RIGHT HEART CATH N/A 03/23/2019   Procedure: RIGHT HEART CATH;  Surgeon: Dolores Patty, MD;  Location: [facility] INVASIVE CV LAB;  Service: Cardiovascular;  Laterality: N/A;   TONSILLECTOMY     TOTAL NEPHRECTOMY  1988?; 1994; 2005   VIDEO BRONCHOSCOPY WITH RADIAL ENDOBRONCHIAL ULTRASOUND  11/16/2022   Procedure: VIDEO BRONCHOSCOPY WITH RADIAL ENDOBRONCHIAL ULTRASOUND;  Surgeon: Leslye Peer, MD;  Location: [facility] ENDOSCOPY;  Service: Pulmonary;;     reports that she has quit smoking. Her smoking use included cigarettes. She has a 52.5 pack-year smoking history. She has never been exposed to tobacco smoke. She has never used smokeless tobacco. She reports that she does not currently use alcohol. She reports current drug use. Frequency: 1.00 time per week. Drug: Marijuana.  Allergies  Allergen Reactions   Levofloxacin Itching and Rash   Tape Other (See Comments)    "Certain surgical tapes peel off my skin"    Family History  Problem Relation Age of Onset   Glaucoma Mother    Pancreatic cancer Father    Multiple sclerosis Brother    Diabetes Maternal Uncle    Hypertension Maternal Grandmother    Breast cancer Neg Hx     Prior to Admission medications   Medication Sig Start Date End Date Taking? Authorizing Provider  allopurinol (ZYLOPRIM) 100 MG tablet Take 0.5 tablets (50 mg total) by mouth 2 (two) times a week. Patient taking differently: Take 100 mg by mouth daily in the afternoon. 12/24/22   Tawkaliyar, Roya, DO  amiodarone (PACERONE) 200 MG tablet Take 0.5 tablets (100 mg total) by mouth daily. 07/03/22   Eustace Pen, PA-C  apixaban (ELIQUIS) 2.5 MG TABS tablet Take 1 tablet (2.5 mg total) by mouth 2 (two) times daily. Okay to  restart this medication on 11/18/2022 11/16/22   Leslye Peer, MD  AURYXIA 1 GM 210 MG(Fe) tablet Take 420 mg by mouth 3 (three) times daily.    [provider]  Colchicine 0.6 MG CAPS Take 0.6 mg by mouth daily as needed (gout flare up). 01/12/22   Ivonne Andrew, NP  cyclobenzaprine (FLEXERIL) 5 MG tablet Take 1 tablet (5 mg  total) by mouth at bedtime as needed. 08/21/22   Rice, Jamesetta Orleans, MD  dexamethasone (DECADRON) 4 MG tablet Take 2 tablets (8mg ) by mouth daily starting the day after carboplatin for 3 days. Take with food 01/20/23   Pasam, Avinash, MD  dexamethasone (DECADRON) 4 MG tablet Take 1 tablet (4 mg total) by mouth 2 (two) times daily with a meal. 02/15/23   Pasam, Avinash, MD  diclofenac Sodium (VOLTAREN) 1 % GEL Apply 4 g topically as needed. 06/22/22   Ivonne Andrew, NP  diphenhydrAMINE (BENADRYL) 25 mg capsule Take 1 capsule (25 mg total) by mouth every 8 (eight) hours as needed. 07/21/19   Kallie Locks, FNP  dorzolamide-timolol (COSOPT) 2-0.5 % ophthalmic solution Place 1 drop into the left eye 2 (two) times daily. 12/13/21   [provider]  famotidine (PEPCID) 20 MG tablet Take 1 tablet (20 mg total) by mouth 2 (two) times daily. 03/15/23   Ivonne Andrew, NP  fluticasone-salmeterol (ADVAIR HFA) 161-09 MCG/ACT inhaler Inhale 2 puffs into the lungs 2 (two) times daily. 04/28/21   Martina Sinner, MD  gabapentin (NEURONTIN) 100 MG capsule Take 100 mg by mouth 3 (three) times daily as needed (nerve pain).    [provider]  heparin sodium, porcine, 1000 UNIT/ML injection Inject 1,000 Units into the peritoneum once. 09/03/22 09/02/23  [provider]  Lactulose 20 GM/30ML SOLN Take 30 mLs (20 g total) by mouth 2 (two) times daily as needed (constipation). 03/01/23   Dorcas Carrow, MD  metoCLOPramide (REGLAN) 10 MG tablet Take 1 tablet (10 mg total) by mouth 3 (three) times daily before meals. Patient taking differently: Take 10 mg by  mouth daily in the afternoon. 12/02/22   Meryl Dare, MD  midodrine (PROAMATINE) 10 MG tablet TAKE 1.5 TABLETS (15 MG TOTAL) BY MOUTH 3 (THREE) TIMES DAILY. NEEDS FOLLOW UP APPOINTMENT FOR ANYMORE REFILLS 01/18/23   Bensimhon, Bevelyn Buckles, MD  omeprazole (PRILOSEC) 40 MG capsule TAKE 1 CAPSULE (40 MG TOTAL) BY MOUTH DAILY. 01/15/23   Meryl Dare, MD  ondansetron (ZOFRAN) 8 MG tablet Take 1 tablet (8 mg total) by mouth every 8 (eight) hours as needed for nausea or vomiting. Start on the third day after carboplatin. 01/20/23   Pasam, Archie Patten, MD  oxyCODONE-acetaminophen (PERCOCET) 10-325 MG tablet Take 1 tablet by mouth 4 (four) times daily as needed for pain. 09/08/19   [provider]  PRESCRIPTION MEDICATION Take 1 tablet by mouth 2 (two) times daily. Unknown name of medication for brain tumor.    [provider]  senna-docusate (SENOKOT-S) 8.6-50 MG tablet Take 1 tablet by mouth at bedtime. 03/01/23   Dorcas Carrow, MD  sertraline (ZOLOFT) 25 MG tablet Take 1 tablet (25 mg total) by mouth daily. 01/08/23   Ivonne Andrew, NP  VELPHORO 500 MG chewable tablet Taking 500 mg by mouth per meal or snack    [provider]  zolpidem (AMBIEN) 10 MG tablet Take 1 tablet (10 mg total) by mouth at bedtime as needed for sleep. 08/24/19   Maurilio Lovely D, DO     Physical Exam: Vitals:   03/20/23 2230 03/20/23 2240 03/20/23 2245 03/20/23 2300  BP: (!) 86/66 98/64 94/69  105/69  Pulse: 85 85 85 84  Resp: 14 20 18 18   Temp:  97.8 F (36.6 C)    TempSrc:  Oral    SpO2: 100% 100% 100% 100%    Physical Exam Vitals and nursing note reviewed.  HENT:     Nose: Nose normal.  Eyes:     Conjunctiva/sclera: Conjunctivae normal.  Cardiovascular:     Rate and Rhythm: Normal rate and regular rhythm.     Pulses: Normal pulses.     Heart sounds: Normal heart sounds.  Pulmonary:     Effort: Pulmonary effort is normal.     Breath sounds: Normal breath sounds.  Abdominal:      General: Bowel sounds are normal. There is no distension.     Tenderness: There is no abdominal tenderness.  Musculoskeletal:        General: No swelling.     Cervical back: Neck supple.     Right lower leg: No edema.     Left lower leg: No edema.  Skin:    General: Skin is warm.     Capillary Refill: Capillary refill takes less than 2 seconds.     Coloration: Skin is pale.     Findings: No bruising or rash.  Neurological:     Mental Status: She is alert and oriented to person, place, and time.  Psychiatric:        Mood and Affect: Mood normal.        Behavior: Behavior normal.        Thought Content: Thought content normal.      Labs on Admission: I have personally reviewed following labs and imaging studies  CBC: Recent Labs  Lab 03/20/23 1515  WBC 2.1*  NEUTROABS 1.3*  HGB 5.5*  HCT 17.1*  MCV 90.0  PLT 96*   Basic Metabolic Panel: Recent Labs  Lab 03/20/23 1515  NA 135  K 3.6  CL 95*  CO2 28  GLUCOSE 80  BUN 29*  CREATININE 3.33*  CALCIUM 8.0*   GFR: Estimated Creatinine Clearance: 13 mL/min (A) (by C-G formula based on SCr of 3.33 mg/dL (H)). Liver Function Tests: Recent Labs  Lab 03/20/23 1515  AST 37  ALT 48*  ALKPHOS 152*  BILITOT 0.4  PROT 5.6*  ALBUMIN 3.1*   No results for input(s): "LIPASE", "AMYLASE" in the last 168 hours. No results for input(s): "AMMONIA" in the last 168 hours. Coagulation Profile: No results for input(s): "INR", "PROTIME" in the last 168 hours. Cardiac Enzymes: No results for input(s): "CKTOTAL", "CKMB", "CKMBINDEX", "TROPONINI", "TROPONINIHS" in the last 168 hours. BNP (last 3 results) Recent Labs    02/03/23 1029 03/20/23 1515  BNP 292.1* 138.9*   HbA1C: No results for input(s): "HGBA1C" in the last 72 hours. CBG: No results for input(s): "GLUCAP" in the last 168 hours. Lipid Profile: No results for input(s): "CHOL", "HDL", "LDLCALC", "TRIG", "CHOLHDL", "LDLDIRECT" in the last 72 hours. Thyroid Function  Tests: No results for input(s): "TSH", "T4TOTAL", "FREET4", "T3FREE", "THYROIDAB" in the last 72 hours. Anemia Panel: No results for input(s): "VITAMINB12", "FOLATE", "FERRITIN", "TIBC", "IRON", "RETICCTPCT" in the last 72 hours. Urine analysis:    Component Value Date/Time   COLORURINE YELLOW 02/28/2023 0620   APPEARANCEUR HAZY (A) 02/28/2023 0620   LABSPEC 1.028 02/28/2023 0620   PHURINE 5.0 02/28/2023 0620   GLUCOSEU NEGATIVE 02/28/2023 0620   HGBUR NEGATIVE 02/28/2023 0620   BILIRUBINUR NEGATIVE 02/28/2023 0620   BILIRUBINUR Negative 01/04/2019 1030   KETONESUR NEGATIVE 02/28/2023 0620   PROTEINUR 100 (A) 02/28/2023 0620   UROBILINOGEN 0.2 01/04/2019 1030   UROBILINOGEN 0.2 10/15/2014 0946   NITRITE NEGATIVE 02/28/2023 0620   LEUKOCYTESUR NEGATIVE 02/28/2023 1610    Radiological Exams on Admission: I have personally reviewed images CT  PELVIS WO CONTRAST Result Date: 03/20/2023 CLINICAL DATA:  coccyx/sacral TTP, midline Review of radiologic records indicates history of stage IV lung cancer. EXAM: CT PELVIS WITHOUT CONTRAST TECHNIQUE: Multidetector CT imaging of the pelvis was performed following the standard protocol without intravenous contrast. RADIATION DOSE REDUCTION: This exam was performed according to the departmental dose-optimization program which includes automated exposure control, adjustment of the mA and/or kV according to patient size and/or use of iterative reconstruction technique. COMPARISON:  CT 02/27/2023 FINDINGS: Urinary Tract: Decompressed urinary bladder, diffuse bladder wall thickening. 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 Bowel: No inflammation of pelvic bowel loops. Moderate volume of colonic stool. Vascular/Lymphatic: Extensive arterial vascular calcifications. Right-sided dialysis catheter, extending beyond the field of view. No pelvic adenopathy. Reproductive: Soft tissue prominence in the region of the cervix. Otherwise unremarkable.  Other:  No pelvic ascites.  Mild generalized body wall edema. 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 No evidence of fracture or focal bone lesion. No bony destructive change. No intramuscular collection in the absence of IV contrast. IMPRESSION: 1. No evidence of metastatic disease in the pelvis. No explanation for sacrococcygeal tenderness. 2. Soft tissue prominence in the region of the cervix, recommend correlation with physical exam. 3. 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 4. Right femoral head avascular necrosis. 5. Mild generalized body wall edema. Electronically Signed   By: Narda Rutherford M.D.   On: 03/20/2023 18:52   DG Chest Portable 1 View Result Date: 03/20/2023 CLINICAL DATA:  Dyspnea EXAM: PORTABLE CHEST 1 VIEW COMPARISON:  02/27/2023. FINDINGS: Left hemithorax is opacified. Pulmonary interstitial prominence with a nodular configuration of the right hemithorax. Metastatic disease not excluded. No pneumothorax. Stent overlies the right hilar region. Osseous structures appear intact. IMPRESSION: Nodular interstitial process on the right. Completely opacified left hemithorax. Electronically Signed   By: Layla Maw M.D.   On: 03/20/2023 18:27   EKG: My personal interpretation of EKG shows: Pending EKG  Assessment/Plan: Principal Problem:   Acute on chronic anemia Active Problems:   Anemia of chronic disease   History of deep vein thrombosis (DVT) of right lower extremity   Gastroparesis   History of renal transplantation   ESRD (end stage renal disease) (HCC)   Gout   Chronic pain syndrome   Cervical stenosis of spinal canal   History of CVA (cerebrovascular accident)   GERD (gastroesophageal reflux disease)   Hypotension   Primary malignant neoplasm of lung with metastasis to brain [facility])   Pancreatic adenocarcinoma (HCC)   Metastatic adenocarcinoma to lung (HCC)   Chronic idiopathic thrombocytopenia (HCC)   History of  atrial flutter   COPD (chronic obstructive pulmonary disease) (HCC)   Chronic hypoxic respiratory failure (HCC)   History of gout   Depression   Avascular necrosis of femoral neck (HCC)    Assessment and Plan: Acute on chronic anemia Anemia of chronic disease-multifactorial -Patient has been referred from the dialysis unit as she found to have low hemoglobin around 7. - In the ED workup revealed low hemoglobin 5.5. -Patient complaining about generalized fatigue otherwise she does not have any hemoptysis, hematemesis, bright red per rectum, black tarry stool and melena. -Patient has history of anemia of chronic disease secondary to multiple comorbidities.  She received time to time blood transfusion last blood transfusion on February 12, 2023.  Baseline hemoglobin around 7.6-9. -Continue monitor H&H - Prepared total 3 units of blood and transfusing 2 unit of blood tonight. - Goal to keep hemoglobin above 8.  History of atrial flutter -Holding Eliquis in the setting of low hemoglobin.  Once hemoglobin we will around her baseline can resume the Eliquis. - Continue amiodarone 100 mg daily  History of DVT of the right lower extremity -Physical exam no evidence of lower extremity swelling.  Holding Eliquis in the setting of low hemoglobin.  Will resume once hemoglobin around the baseline.  Gastroparesis -Continue home regimen include Reglan and senna.  History of renal transplant ESRD on dialysis TTS schedule -Currently patient is not any immunosuppressant therapy given patient is actively on chemotherapy for management of metastatic lung cancer. - Patient received dialysis today 03/20/2023 - Physical exam no evidence of volume overload - BMP unremarkable except evidence of ESRD - No urgent need for dialysis tonight. - Need to consult nephrology for dialysis for Tuesday.  History of gout -Continue allopurinol 100 mg daily.  History of CVA -Eliquis on hold in the setting of low  hemoglobin.  Chronic hypotension - Continue midodrine 15 mg 3 times daily.  Primary neoplasm of the lung with metastasis to pancreas and brain Pancreatic adenocarcinoma Lung adenocarcinoma -Patient has history of lung adenocarcinoma, pancreatic adenocarcinoma with brain metastasis.  Patient follows oncology Dr. Arlana Pouch.  Patient is currently on carboplatin, paclitaxel and Keytruda therapy. -Referring to Oncology from Dr. Arlana Pouch from 03/12/2023 for in-depth information. -Patient reported that after the carboplatin therapy patient is 1 tapering dose of prednisone need to complete 3 more days course currently on 2 mg. -Continue Decadron 2 mg for 3 more days. Added Dr. Arlana Pouch on the team.  Chronic thrombocytopenia -Platelet 96 around baseline.  Continue to monitor.  COPD Chronic hypoxic respiratory failure 3 to 4 L oxygen at baseline -Stable - Continue Dulera twice daily - Continue DuoNeb as needed for wheezing shortness of breath.  Depression -Continue Zoloft 25 mg daily  Insomnia -Continue Ambien  Chronic pain syndrome -Continue Flexeril and gabapentin.  Femoral neck avascular necrosis - Patient is complaining of tailbone pain. - CT abdomen pelvis showed right sided femoral neck avascular necrosis. -Femoral neck avascular necrosis in the setting of chronic steroid use - Starting oral calcium supplement and consulting inpatient PT therapy  DVT prophylaxis:  SCDs.  Deferring Eliquis and pharmacological prophylaxis in the setting of low hemoglobin 5.5. Code Status:  Full Code Diet: Renal diet Family Communication:  Family was present at bedside, at the time of interview. Opportunity was given to ask question and all questions were answered satisfactorily.  Disposition Plan: Continue to monitor H&H and transfuse as needed to keep hemoglobin above 8.  Once hemoglobin stabilized can DC to home Consults: Physical therapy Admission status:   Inpatient, Telemetry bed  Severity of  Illness: The appropriate patient status for this patient is INPATIENT. Inpatient status is judged to be reasonable and necessary in order to provide the required intensity of service to ensure the patient's safety. The patient's presenting symptoms, physical exam findings, and initial radiographic and laboratory data in the context of their chronic comorbidities is felt to place them at high risk for further clinical deterioration. Furthermore, it is not anticipated that the patient will be medically stable for discharge from the hospital within 2 midnights of admission.   * I certify that at the point of admission it is my clinical judgment that the patient will require inpatient hospital care spanning beyond 2 midnights from the point of admission due to high intensity of service, high risk for further deterioration and high frequency of surveillance required.*    Eulah Pont Ronnisha Felber,  MD [facility]  How to contact the [facility] Attending or Consulting provider 7A - 7P or covering provider during after hours 7P -7A, for this patient.  Check the care team in [facility] and look for a) attending/consulting TRH provider listed and b) the [facility] team listed Log into [email or web address] and use [place]'s universal password to access. If you do not have the password, please contact the hospital operator. Locate the [facility] provider you are looking for under [facility] and page to a number that you can be directly reached. If you still have difficulty reaching the provider, please page the [facility] (Director on Call) for the Hospitalists listed on amion for assistance.  03/20/2023, 11:35 PM

## 2023-03-20 NOTE — ED Notes (Signed)
Okay to give pain medication with BP per Dr. Jearld Fenton

## 2023-03-21 DIAGNOSIS — D62 Acute posthemorrhagic anemia: Secondary | ICD-10-CM | POA: Diagnosis not present

## 2023-03-21 DIAGNOSIS — R0600 Dyspnea, unspecified: Secondary | ICD-10-CM | POA: Diagnosis not present

## 2023-03-21 DIAGNOSIS — M533 Sacrococcygeal disorders, not elsewhere classified: Secondary | ICD-10-CM | POA: Diagnosis not present

## 2023-03-21 DIAGNOSIS — Z86718 Personal history of other venous thrombosis and embolism: Secondary | ICD-10-CM | POA: Diagnosis not present

## 2023-03-21 DIAGNOSIS — F32A Depression, unspecified: Secondary | ICD-10-CM

## 2023-03-21 DIAGNOSIS — N186 End stage renal disease: Secondary | ICD-10-CM

## 2023-03-21 DIAGNOSIS — C259 Malignant neoplasm of pancreas, unspecified: Secondary | ICD-10-CM

## 2023-03-21 DIAGNOSIS — D649 Anemia, unspecified: Secondary | ICD-10-CM | POA: Diagnosis not present

## 2023-03-21 DIAGNOSIS — Z8679 Personal history of other diseases of the circulatory system: Secondary | ICD-10-CM

## 2023-03-21 DIAGNOSIS — Z8673 Personal history of transient ischemic attack (TIA), and cerebral infarction without residual deficits: Secondary | ICD-10-CM

## 2023-03-21 DIAGNOSIS — C7800 Secondary malignant neoplasm of unspecified lung: Secondary | ICD-10-CM

## 2023-03-21 DIAGNOSIS — M4802 Spinal stenosis, cervical region: Secondary | ICD-10-CM

## 2023-03-21 LAB — TYPE AND SCREEN
ABO/RH(D): B POS
Antibody Screen: NEGATIVE
Unit division: 0
Unit division: 0

## 2023-03-21 LAB — BPAM RBC
Blood Product Expiration Date: 202503132359
Blood Product Expiration Date: 202503132359
ISSUE DATE / TIME: 202502152002
ISSUE DATE / TIME: 202502152228
Unit Type and Rh: 5100
Unit Type and Rh: 5100

## 2023-03-21 LAB — COMPREHENSIVE METABOLIC PANEL
ALT: 42 U/L (ref 0–44)
AST: 29 U/L (ref 15–41)
Albumin: 3.2 g/dL — ABNORMAL LOW (ref 3.5–5.0)
Alkaline Phosphatase: 135 U/L — ABNORMAL HIGH (ref 38–126)
Anion gap: 13 (ref 5–15)
BUN: 36 mg/dL — ABNORMAL HIGH (ref 6–20)
CO2: 26 mmol/L (ref 22–32)
Calcium: 8.7 mg/dL — ABNORMAL LOW (ref 8.9–10.3)
Chloride: 99 mmol/L (ref 98–111)
Creatinine, Ser: 4.41 mg/dL — ABNORMAL HIGH (ref 0.44–1.00)
GFR, Estimated: 11 mL/min — ABNORMAL LOW (ref >60)
Glucose, Bld: 84 mg/dL (ref 70–99)
Potassium: 4 mmol/L (ref 3.5–5.1)
Sodium: 138 mmol/L (ref 135–145)
Total Bilirubin: 0.7 mg/dL (ref 0.0–1.2)
Total Protein: 5.5 g/dL — ABNORMAL LOW (ref 6.5–8.1)

## 2023-03-21 LAB — CBC
HCT: 24.8 % — ABNORMAL LOW (ref 36.0–46.0)
Hemoglobin: 8.4 g/dL — ABNORMAL LOW (ref 12.0–15.0)
MCH: 30.1 pg (ref 26.0–34.0)
MCHC: 33.9 g/dL (ref 30.0–36.0)
MCV: 88.9 fL (ref 80.0–100.0)
Platelets: 94 10*3/uL — ABNORMAL LOW (ref 150–400)
RBC: 2.79 MIL/uL — ABNORMAL LOW (ref 3.87–5.11)
RDW: 15.9 % — ABNORMAL HIGH (ref 11.5–15.5)
WBC: 2 10*3/uL — ABNORMAL LOW (ref 4.0–10.5)
nRBC: 0 % (ref 0.0–0.2)

## 2023-03-21 MED ORDER — OYSTER SHELL CALCIUM/D3 500-5 MG-MCG PO TABS: 1.0000 | ORAL_TABLET | Freq: Two times a day (BID) | ORAL | 1 refills | Status: DC

## 2023-03-21 MED ORDER — HYDROMORPHONE HCL 1 MG/ML IJ SOLN
0.5000 mg | Freq: Once | INTRAMUSCULAR | Status: AC
  Administered 2023-03-21: 0.5 mg via INTRAVENOUS
  Filled 2023-03-21: qty 1

## 2023-03-21 NOTE — Care Management (Signed)
The patient has weakness undergoing cancer treatments and is confined to one room she is in need of a bedside commode.

## 2023-03-21 NOTE — Care Management CC44 (Signed)
Condition Code 44 Documentation Completed  Patient Details  Name: Tina Watkins MRN: [id number] Date of Birth: Jan 23, 1966   Condition Code 44 given:  Yes Patient signature on Condition Code 44 notice:  Yes Documentation of 2 MD's agreement:  Yes Code 44 added to claim:  Yes    Lockie Pares, RN 03/21/2023, 12:51 PM

## 2023-03-21 NOTE — Care Management (Signed)
Transition of Care [facility]) - Inpatient Brief Assessment   Patient Details  Name: LARA PALINKAS MRN: [id number] Date of Birth: 26-May-1965  [facility] CM/SW Contact:    Lockie Pares, RN Phone Number: 03/21/2023, 1:14 PM   Clinical Narrative:  Patient presented with anemia weakness.  She is undergoing cancer treatments on home oxygen requesting a bedside commode this will be delivered to house by [employer].  Home health recommended, patient is amenable to this called and accepted by [facility]   No further needs identified, has oxygen to go home with..  Transition of Care Asessment:   Patient has primary care physician: Yes Home environment has been reviewed: Apartment Prior level of function:: Independent on oxygen Prior/Current Home Services: No current home services Social Drivers of Health Review: SDOH reviewed no interventions necessary Readmission risk has been reviewed: Yes Transition of care needs: transition of care needs identified, TOC will continue to follow

## 2023-03-21 NOTE — Hospital Course (Addendum)
Taken from H&P.  Tina Watkins is a 58 y.o. female with medical history significant of lung adenocarcinoma diagnosed in December 2024 follows oncology with Dr. 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   Patient did not had any obvious source of bleeding.  On presentation CBC with hemoglobin of 5.5, baseline hemoglobin between 7.4-9.Chest x-ray showed nodular interstitial process of the right and complete opacification of the left hemithorax.   CT pelvis without contrast no evidence of metastatic disease in the pelvis.  Soft tissue prominence in the region of the cervix.  Right lower quadrant renal transplant without hydronephrosis, right femoral neck Vascular necrosis.   In the ED 2 unit of blood transfusion has been ordered.  2/16: Borderline soft blood pressure at 88/63, hemoglobin improved to 8.4. Patient did not had any bleeding.  Last chemo was 2 weeks ago, likely is decreased cell counts secondary to chemo.  Patient feels at her baseline.  She received her routine dialysis yesterday and wants to go home.  Patient has mild hypocalcemia-started on supplement.  Patient will continue with rest of her home medications and follow-up with her providers closely for further recommendation.

## 2023-03-21 NOTE — Care Management Obs Status (Signed)
MEDICARE OBSERVATION STATUS NOTIFICATION   Patient Details  Name: Tina Watkins MRN: [id number] Date of Birth: 1965/07/19   Medicare Observation Status Notification Given:  Yes    Lockie Pares, RN 03/21/2023, 12:51 PM

## 2023-03-21 NOTE — Discharge Summary (Signed)
Physician Discharge Summary   Patient: Tina Watkins MRN: [id number] DOB: Apr 02, 1965  Admit date:     03/20/2023  Discharge date: 03/21/23  Discharge Physician: Arnetha Courser   PCP: Ivonne Andrew, NP   Recommendations at discharge:  Please obtain CBC and BMP on follow-up Follow-up with primary care provider Continue with scheduled dialysis Follow-up with oncology  Discharge Diagnoses: Principal Problem:   Acute on chronic anemia Active Problems:   Anemia of chronic disease   History of deep vein thrombosis (DVT) of right lower extremity   Gastroparesis   History of renal transplantation   ESRD (end stage renal disease) (HCC)   Gout   Chronic pain syndrome   Cervical stenosis of spinal canal   History of CVA (cerebrovascular accident)   GERD (gastroesophageal reflux disease)   Hypotension   Symptomatic anemia   Dyspnea   Primary malignant neoplasm of lung with metastasis to brain [facility])   Pancreatic adenocarcinoma (HCC)   Metastatic adenocarcinoma to lung (HCC)   Chronic idiopathic thrombocytopenia (HCC)   History of atrial flutter   COPD (chronic obstructive pulmonary disease) (HCC)   Chronic hypoxic respiratory failure (HCC)   History of gout   Depression   Avascular necrosis of femoral neck (HCC)   Coccyx pain  Resolved Problems:   * No resolved hospital problems. Tina Watkins: Taken from H&P.  BRE PECINA is a 58 y.o. female with medical history significant of lung adenocarcinoma diagnosed in December 2024 follows oncology with Dr. Lysbeth Galas, pancreatic adenocarcinoma diagnosed in January 2025, lung and pancreatic cancer with metastasis to brain,, anemia of chronic disease, chronic thrombocytopenia, history of renal transplant, ESRD on dialysis TTS schedule, atrial flutter, COPD, chronic hypoxic respiratory failure 4 L oxygen at baseline, depression, gout, chronic pain syndrome, gastroparesis, chronic hypotension on midodrine, and CVA presented to  emergency department as referred from the dialysis unit given her hemoglobin is low 7.   Patient did not had any obvious source of bleeding.  On presentation CBC with hemoglobin of 5.5, baseline hemoglobin between 7.4-9.Chest x-ray showed nodular interstitial process of the right and complete opacification of the left hemithorax.   CT pelvis without contrast no evidence of metastatic disease in the pelvis.  Soft tissue prominence in the region of the cervix.  Right lower quadrant renal transplant without hydronephrosis, right femoral neck Vascular necrosis.   In the ED 2 unit of blood transfusion has been ordered.  2/16: Borderline soft blood pressure at 88/63, hemoglobin improved to 8.4. Patient did not had any bleeding.  Last chemo was 2 weeks ago, likely is decreased cell counts secondary to chemo.  Patient feels at her baseline.  She received her routine dialysis yesterday and wants to go home.  Patient has mild hypocalcemia-started on supplement.  Patient will continue with rest of her home medications and follow-up with her providers closely for further recommendation.   Pain control - [employer] Controlled Substance Reporting System database was reviewed. and patient was instructed, not to drive, operate heavy machinery, perform activities at heights, swimming or participation in water activities or provide baby-sitting services while on Pain, Sleep and Anxiety Medications; until their outpatient Physician has advised to do so again. Also recommended to not to take more than prescribed Pain, Sleep and Anxiety Medications.  Consultants: None Procedures performed: None Disposition: Home Diet recommendation:  Discharge Diet Orders (From admission, onward)     Start     Ordered   03/21/23 0000  Diet - low  sodium heart healthy        03/21/23 1233           Renal diet DISCHARGE MEDICATION: Allergies as of 03/21/2023       Reactions   Levofloxacin Itching, Rash   Tape  Other (See Comments)   "Certain surgical tapes peel off my skin"        Medication List     STOP taking these medications    heparin sodium (porcine) 1000 UNIT/ML injection       TAKE these medications    Advair HFA 115-21 MCG/ACT inhaler Generic drug: fluticasone-salmeterol Inhale 2 puffs into the lungs 2 (two) times daily.   allopurinol 100 MG tablet Commonly known as: ZYLOPRIM Take 0.5 tablets (50 mg total) by mouth 2 (two) times a week. What changed:  how much to take when to take this   amiodarone 200 MG tablet Commonly known as: PACERONE Take 0.5 tablets (100 mg total) by mouth daily.   apixaban 2.5 MG Tabs tablet Commonly known as: Eliquis Take 1 tablet (2.5 mg total) by mouth 2 (two) times daily. Okay to restart this medication on 11/18/2022   Auryxia 1 GM 210 MG(Fe) tablet Generic drug: ferric citrate Take 420 mg by mouth 3 (three) times daily.   calcium-vitamin D 500-5 MG-MCG tablet Commonly known as: OSCAL WITH D Take 1 tablet by mouth 2 (two) times daily.   Colchicine 0.6 MG Caps Take 0.6 mg by mouth daily as needed (gout flare up).   cyclobenzaprine 5 MG tablet Commonly known as: FLEXERIL Take 1 tablet (5 mg total) by mouth at bedtime as needed.   dexamethasone 4 MG tablet Commonly known as: DECADRON Take 2 tablets (8mg ) by mouth daily starting the day after carboplatin for 3 days. Take with food What changed:  how much to take how to take this when to take this additional instructions Another medication with the same name was removed. Continue taking this medication, and follow the directions you see here.   diphenhydrAMINE 25 mg capsule Commonly known as: BENADRYL Take 1 capsule (25 mg total) by mouth every 8 (eight) hours as needed. What changed: reasons to take this   dorzolamide-timolol 2-0.5 % ophthalmic solution Commonly known as: COSOPT Place 1 drop into the left eye every evening.   famotidine 20 MG tablet Commonly known  as: PEPCID Take 1 tablet (20 mg total) by mouth 2 (two) times daily.   gabapentin 100 MG capsule Commonly known as: NEURONTIN Take 100 mg by mouth 3 (three) times daily as needed (nerve pain).   Lactulose 20 GM/30ML Soln Take 30 mLs (20 g total) by mouth 2 (two) times daily as needed (constipation).   metoCLOPramide 10 MG tablet Commonly known as: Reglan Take 1 tablet (10 mg total) by mouth 3 (three) times daily before meals. What changed: when to take this   midodrine 10 MG tablet Commonly known as: PROAMATINE TAKE 1.5 TABLETS (15 MG TOTAL) BY MOUTH 3 (THREE) TIMES DAILY. NEEDS FOLLOW UP APPOINTMENT FOR ANYMORE REFILLS   omeprazole 40 MG capsule Commonly known as: PRILOSEC TAKE 1 CAPSULE (40 MG TOTAL) BY MOUTH DAILY.   ondansetron 8 MG tablet Commonly known as: Zofran Take 1 tablet (8 mg total) by mouth every 8 (eight) hours as needed for nausea or vomiting. Start on the third day after carboplatin.   oxyCODONE-acetaminophen 10-325 MG tablet Commonly known as: PERCOCET Take 1 tablet by mouth 4 (four) times daily as needed for pain.   sertraline 25  MG tablet Commonly known as: ZOLOFT Take 1 tablet (25 mg total) by mouth daily.   Velphoro 500 MG chewable tablet Generic drug: sucroferric oxyhydroxide Chew 500 mg by mouth See admin instructions. Take one tablet by mouth three times a daily with meals and snakes per patient   zolpidem 10 MG tablet Commonly known as: AMBIEN Take 1 tablet (10 mg total) by mouth at bedtime as needed for sleep.        Follow-up Information     Ivonne Andrew, NP. Schedule an appointment as soon as possible for a visit in 1 week(s).   Specialties: Pulmonary Disease, Endocrinology Contact information: [address] [place] [address] [phone]                Discharge Exam: There were no vitals filed for this visit. General.  Frail and malnourished lady, in no acute distress. Pulmonary.  Lungs clear  bilaterally, normal respiratory effort. CV.  Regular rate and rhythm, no JVD, rub or murmur. Abdomen.  Soft, nontender, nondistended, BS positive. CNS.  Alert and oriented .  No focal neurologic deficit. Extremities.  No edema, no cyanosis, pulses intact and symmetrical.  Condition at discharge: stable  The results of significant diagnostics from this hospitalization (including imaging, microbiology, ancillary and laboratory) are listed below for reference.   Imaging Studies: CT PELVIS WO CONTRAST Result Date: 03/20/2023 CLINICAL DATA:  coccyx/sacral TTP, midline Review of radiologic records indicates history of stage IV lung cancer. EXAM: CT PELVIS WITHOUT CONTRAST TECHNIQUE: Multidetector CT imaging of the pelvis was performed following the standard protocol without intravenous contrast. RADIATION DOSE REDUCTION: This exam was performed according to the departmental dose-optimization program which includes automated exposure control, adjustment of the mA and/or kV according to patient size and/or use of iterative reconstruction technique. COMPARISON:  CT 02/27/2023 FINDINGS: Urinary Tract: Decompressed urinary bladder, diffuse bladder wall thickening. 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 Bowel: No inflammation of pelvic bowel loops. Moderate volume of colonic stool. Vascular/Lymphatic: Extensive arterial vascular calcifications. Right-sided dialysis catheter, extending beyond the field of view. No pelvic adenopathy. Reproductive: Soft tissue prominence in the region of the cervix. Otherwise unremarkable. Other:  No pelvic ascites.  Mild generalized body wall edema. 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 No evidence of fracture or focal bone lesion. No bony destructive change. No intramuscular collection in the absence of IV contrast. IMPRESSION: 1. No evidence of metastatic disease in the pelvis. No explanation for sacrococcygeal tenderness. 2. Soft  tissue prominence in the region of the cervix, recommend correlation with physical exam. 3. 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 4. Right femoral head avascular necrosis. 5. Mild generalized body wall edema. Electronically Signed   By: Narda Rutherford M.D.   On: 03/20/2023 18:52   DG Chest Portable 1 View Result Date: 03/20/2023 CLINICAL DATA:  Dyspnea EXAM: PORTABLE CHEST 1 VIEW COMPARISON:  02/27/2023. FINDINGS: Left hemithorax is opacified. Pulmonary interstitial prominence with a nodular configuration of the right hemithorax. Metastatic disease not excluded. No pneumothorax. Stent overlies the right hilar region. Osseous structures appear intact. IMPRESSION: Nodular interstitial process on the right. Completely opacified left hemithorax. Electronically Signed   By: Layla Maw M.D.   On: 03/20/2023 18:27   MR BRAIN W WO CONTRAST Result Date: 03/01/2023 CLINICAL DATA:  Brain/CNS neoplasm, staging. EXAM: MRI HEAD WITHOUT AND WITH CONTRAST TECHNIQUE: Multiplanar, multiecho pulse sequences of the brain and surrounding structures were obtained without and with intravenous contrast. CONTRAST:  5mL GADAVIST GADOBUTROL 1 MMOL/ML IV SOLN COMPARISON:  MRI of the brain January 29, 2023. FINDINGS: Brain: There has been interval growth of the left insular enhancing lesion, measuring approximately 15 x 11 mm compared to 11 x 9 mm on prior. There is no surrounding edema. No other lesion identified. Areas of encephalomalacia and gliosis with associated hemosiderin deposit in the right parietal and occipital lobes. Small remote infarcts in the left basal ganglia and right centrum semiovale. Hemosiderin deposit in the choroid plexus along the lateral ventricles and fourth ventricle suggest prior hemorrhage. Scattered and confluent foci T2 hyperintensity are seen within the white matter the cerebral hemispheres nonspecific. No acute infarction, hemorrhage, hydrocephalus or extra-axial  collection. Vascular: Normal flow voids. Skull and upper cervical spine: Diffuse decrease of the T1 signal of the calvarium and clivus, likely related to chronic anemia. Sinuses/Orbits: Right phthisis bulbi.  Paranasal sinuses are clear. Other: None. IMPRESSION: Interval growth of the left insular enhancing lesion, measuring approximately 15 x 11 mm compared to 11 x 9 mm on prior. No surrounding edema. No other lesion identified. Electronically Signed   By: Baldemar Lenis M.D.   On: 03/01/2023 15:54   DG Abd 1 View Result Date: 02/28/2023 CLINICAL DATA:  Constipation EXAM: ABDOMEN - 1 VIEW COMPARISON:  05/10/2019 FINDINGS: Scattered large and small bowel gas is noted. No obstructive changes are seen. Dialysis catheter is noted via right femoral approach with the tip in the IVC. Multiple surgical changes are noted. No significant retained fecal material is noted. No free air is seen. SVC stent is noted. IMPRESSION: No evidence of constipation or obstructive change. Electronically Signed   By: Alcide Clever M.D.   On: 02/28/2023 19:28   CT ABDOMEN PELVIS W CONTRAST Result Date: 02/27/2023 CLINICAL DATA:  History of stage IV lung cancer on chemotherapy presenting with upper mid abdominal pain associated with nausea, vomiting, and diarrhea. Pancreatic lesions with biopsy showing adenocarcinoma. * Tracking Code: BO * EXAM: CT ABDOMEN AND PELVIS WITH CONTRAST TECHNIQUE: Multidetector CT imaging of the abdomen and pelvis was performed using the standard protocol following bolus administration of intravenous contrast. RADIATION DOSE REDUCTION: This exam was performed according to the departmental dose-optimization program which includes automated exposure control, adjustment of the mA and/or kV according to patient size and/or use of iterative reconstruction technique. CONTRAST:  75mL OMNIPAQUE IOHEXOL 350 MG/ML SOLN COMPARISON:  CT abdomen and pelvis dated 02/22/2023 FINDINGS: Lower chest: Innumerable  right basilar pulmonary nodules measuring up to 7 x 5 mm in the peripheral right middle lobe (5:11), grossly unchanged compared to 02/22/2023. Diffuse interlobular septal thickening of the right lung base, which appears slightly increased in irregularity nodularity, for example along the major fissure. Similar left pleural effusion with near-complete atelectasis and consolidation of the lingula and left lower lobe. Partially imaged heart size is normal. Extensive intrathoracic collaterals are partially imaged. Hepatobiliary: Multifocal hepatic hypodensities are again seen, a few of which are slightly increased in size, for example 1.6 cm segment 7/8 (3:17), previously 14 mm and 1.3 cm segment 2/3 (3:22), previously 11 mm. Mildly nodular hepatic contour. No intra or extrahepatic biliary ductal dilation. Normal gallbladder. Pancreas: No substantial change in size of solid and cystic mass in the pancreatic tail measuring 4.9 x 2.7 cm (3:31). Spleen: Normal in size without focal abnormality. Adrenals/Urinary Tract: Left adrenal gland is not well seen. Normal right adrenal gland. 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 Transplant right lower  quadrant kidney without hydronephrosis or calculi. Decompressed urinary bladder. Stomach/Bowel: Normal appearance of the stomach. No evidence of bowel wall thickening, distention, or inflammatory changes. Appendix is not discretely seen. Vascular/Lymphatic: Right femoral approach vascular catheter tip terminates in the infrarenal IVC. Aortic atherosclerosis. Perirectal/presacral collaterals are again seen. Recanalized paraumbilical vein and subcutaneous collaterals are also present. Unchanged heterogeneous calcification in the right lower quadrant anterior to the external iliac vessels. No enlarged abdominal or pelvic lymph nodes. Reproductive: No adnexal masses. Other: Trace free fluid.  No free air or fluid collection.  Musculoskeletal: No acute or abnormal lytic or blastic osseous lesions. 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 Increased diffuse body wall edema. IMPRESSION: 1. Multifocal hepatic hypodensities, a few of which are slightly increased in size, suspicious for metastatic disease. 2. Innumerable right basilar pulmonary nodules are grossly unchanged compared to 02/22/2023. Diffuse interlobular septal thickening of the right lung base, which appears slightly increased in irregularity and nodularity, suspicious for lymphangitic carcinomatosis. 3. No substantial change in size of solid and cystic mass in the pancreatic tail measuring 4.9 x 2.7 cm, consistent with known adenocarcinoma. 4. Similar left pleural effusion with near-complete atelectasis and consolidation of the lingula and left lower lobe. 5. Increased diffuse body wall edema. 6. 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 7.  Aortic Atherosclerosis (ICD10-I70.0). Electronically Signed   By: Agustin Cree M.D.   On: 02/27/2023 09:23   DG Chest Port 1 View Result Date: 02/27/2023 CLINICAL DATA:  Hepatocellular carcinoma and lung carcinoma. Shortness of breath. EXAM: PORTABLE CHEST 1 VIEW COMPARISON:  02/03/2023 FINDINGS: Increased near-complete opacification of the left hemithorax is seen without mediastinal shift. This may be due to progressive consolidation and/or enlarging left pleural effusion. Tiny nodular opacities and diffuse interstitial prominence throughout the right lung is unchanged. No right pleural effusion seen. Stable right paratracheal soft tissue prominence, consistent with mass or lymphadenopathy. SVC stent remains in place. IMPRESSION: Increased near-complete opacification of left hemithorax, which may be due to progressive consolidation and/or enlarging left pleural effusion. Stable tiny nodular opacities and diffuse interstitial prominence throughout the right lung. Stable right paratracheal soft tissue prominence,  consistent with mass or lymphadenopathy. Electronically Signed   By: Danae Orleans M.D.   On: 02/27/2023 09:19   CT CHEST ABDOMEN PELVIS W CONTRAST Result Date: 02/22/2023 CLINICAL DATA:  Hepatocellular carcinoma, assessment of treatment response, status post chemotherapy. Lung cancer diagnosed in 2024. Dialysis 3 times a week. Prior nephrectomy. * Tracking Code: BO * EXAM: CT CHEST, ABDOMEN, AND PELVIS WITH CONTRAST TECHNIQUE: Multidetector CT imaging of the chest, abdomen and pelvis was performed following the standard protocol during bolus administration of intravenous contrast. RADIATION DOSE REDUCTION: This exam was performed according to the departmental dose-optimization program which includes automated exposure control, adjustment of the mA and/or kV according to patient size and/or use of iterative reconstruction technique. CONTRAST:  75mL OMNIPAQUE IOHEXOL 300 MG/ML  SOLN COMPARISON:  CTA chest from 02/03/2023. MRI abdomen 11/11/2022. PET-CT 10/19/2022. FINDINGS: CT CHEST FINDINGS Cardiovascular: Reduced caliber left pulmonary artery likely attributable to hypoventilation induced raise a constriction. SVC stent noted. Chronic calcific clot in the left subclavian vein and brachiocephalic vein, with near occluded brachiocephalic vein and extensive venous collaterals through the azygous system, right internal mammary vein, and elsewhere. Coronary, aortic arch, and branch vessel atherosclerotic vascular disease. Mediastinum/Nodes: Right paratracheal node/mass 2.9 cm in short axis, previously 2.8 cm. Subcarinal adenopathy 2.0 cm in short axis, formerly the same. Lungs/Pleura: Moderate to large left pleural effusion.  Consolidated left upper lobe with fiducial adjacent to a 2.8 by 1.9 cm left upper lobe hypodense lesion on image 21 series 3. The vast majority of the left lower lobe is atelectatic. Innumerable pulmonary nodules throughout the right lung are observed, generally under 1 cm in diameter. These are  similar to previous. Ground-glass opacities in the right lung and interstitial accentuation in the right lung is improved compared to 02/03/2023, and the right pleural effusion has resolved. An index right upper lobe nodule measures 5 by 6 mm on image 12 series 5, stable. Musculoskeletal: Mild sclerosis, query renal osteodystrophy. CT ABDOMEN PELVIS FINDINGS Hepatobiliary: Scattered hypodense hepatic lesions are observed, not present on 11/11/2022 and similar to. Index right hepatic lobe lesion adjacent to the intrahepatic portion of the IVC measures 1.5 by 1.4 cm on image 45 series 3 (stable from 02/03/2023). There are about 15 lesions. Borderline distended gallbladder. No biliary dilatation. Patent portal vein. Hepatic cirrhosis noted. Pancreas: Mass of the pancreatic tail with cystic elements and possible solid elements, measuring 5.4 by 2.9 by 3.9 cm. Roughly similar appearance to the MRI from 11/11/2022, this could be a postinflammatory lesion, serous cystadenoma, or intraductal papillary mucinous neoplasm. Metastatic disease with cystic elements not excluded. Spleen: Upper normal splenic size. Adrenals/Urinary Tract: Right adrenal gland normal. Left adrenal gland poorly seen. Bilateral native nephrectomies with a atrophic transplant kidney along the right pelvis. Urinary bladder is relatively empty. Stomach/Bowel: Air-level in the sigmoid colon suggesting diarrheal process. No substantial bowel wall thickening or dilated bowel. Vascular/Lymphatic: Atherosclerosis is present, including aortoiliac atherosclerotic disease. Right femoral dialysis catheter terminates in the IVC chronic dense calcification along the anterior margin of the right external iliac artery measuring 2.9 by 1.5 cm on image 81 series 3, not certain whether this is a chronically thrombosed saccular aneurysm or new intra-lesion along the right adnexa, versus a chronic calcified lymph node. This was not hypermetabolic on PET-CT of 10/19/2022.  Perirectal venous collateral vessels noted Reproductive: Calcified lesion is noted in the vascular section above in the vicinity of the right adnexa. Otherwise unremarkable. Other: Mild subcutaneous edema noted.  No current ascites. Musculoskeletal: Faint sclerosis possibly from renal osteodystrophy. 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 No collapse or fracture. IMPRESSION: 1. Moderate to large left pleural effusion with near complete atelectasis of the left lower lobe. 2. Completely consolidated left upper lobe with fiducial adjacent to a 2.8 by 1.9 cm left upper lobe hypodense lesion. 3. Innumerable pulmonary nodules throughout the right lung, generally under 1 cm in diameter, similar to previous. 4. Mediastinal adenopathy, stable. 5. Scattered hypodense hepatic lesions, not present on 11/11/2022 and stable from 02/03/2023, not entirely specific but favoring metastatic disease. 6. Mass of the pancreatic tail with cystic elements and possible solid elements, roughly similar to the MRI from 11/11/2022, this could be a postinflammatory lesion, serous cystadenoma, metastatic disease, or intraductal papillary mucinous neoplasm. 7. Chronic calcific clot in the left subclavian vein and brachiocephalic vein, with near occluded brachiocephalic vein and extensive venous collaterals through the azygous system, right internal mammary vein, and elsewhere. SVC stent noted. 8. Chronic avascular necrosis of the right femoral head. No collapse or fracture. 9. Hepatic cirrhosis. 10. Air-level in the sigmoid colon suggesting diarrheal process. 11. Chronic dense calcification along the anterior margin of the right external iliac artery measuring 2.9 by 1.5 cm, not certain whether this is a chronically thrombosed saccular aneurysm or new intra-lesion along the right adnexa, versus a chronic calcified lymph node. This was not  hypermetabolic on PET-CT of 10/19/2022. 12. Aortic and extensive systemic atherosclerosis. Aortic  Atherosclerosis (ICD10-I70.0). Electronically Signed   By: Gaylyn Rong M.D.   On: 02/22/2023 20:18    Microbiology: Results for orders placed or performed during the hospital encounter of 12/18/22  Group A Strep by PCR     Status: None   Collection Time: 12/18/22  9:16 AM   Specimen: Throat; Sterile Swab  Result Value Ref Range Status   Group A Strep by PCR NOT DETECTED NOT DETECTED Final    Comment: Performed at [facility], [address], [place], [place] [address]  Resp panel by RT-PCR (RSV, Flu A&B, Covid) Throat     Status: None   Collection Time: 12/18/22 10:05 AM   Specimen: Throat; Nasal Swab  Result Value Ref Range Status   SARS Coronavirus 2 by RT PCR NEGATIVE NEGATIVE Final   Influenza A by PCR NEGATIVE NEGATIVE Final   Influenza B by PCR NEGATIVE NEGATIVE Final    Comment: (NOTE) The Xpert Xpress SARS-CoV-2/FLU/RSV plus assay is intended as an aid in the diagnosis of influenza from Nasopharyngeal swab specimens and should not be used as a sole basis for treatment. Nasal washings and aspirates are unacceptable for Xpert Xpress SARS-CoV-2/FLU/RSV testing.  Fact Sheet for Patients: BloggerCourse.com  Fact Sheet for Healthcare Providers: SeriousBroker.it  This test is not yet approved or cleared by the [place] FDA and has been authorized for detection and/or diagnosis of SARS-CoV-2 by FDA under an Emergency Use Authorization (EUA). This EUA will remain in effect (meaning this test can be used) for the duration of the COVID-19 declaration under Section 564(b)(1) of the Act, 21 U.S.C. section 360bbb-3(b)(1), unless the authorization is terminated or revoked.     Resp Syncytial Virus by PCR NEGATIVE NEGATIVE Final    Comment: (NOTE) Fact Sheet for Patients: BloggerCourse.com  Fact Sheet for Healthcare Providers: SeriousBroker.it  This test is not  yet approved or cleared by the [place] FDA and has been authorized for detection and/or diagnosis of SARS-CoV-2 by FDA under an Emergency Use Authorization (EUA). This EUA will remain in effect (meaning this test can be used) for the duration of the COVID-19 declaration under Section 564(b)(1) of the Act, 21 U.S.C. section 360bbb-3(b)(1), unless the authorization is terminated or revoked.  Performed at [facility], [address], [place], [place] [address]   Blood culture (routine x 2)     Status: None   Collection Time: 12/18/22 10:25 AM   Specimen: BLOOD RIGHT HAND  Result Value Ref Range Status   Specimen Description BLOOD RIGHT HAND  Final   Special Requests AEROBIC BOTTLE ONLY Blood Culture adequate volume  Final   Culture   Final    NO GROWTH 5 DAYS Performed at [facility], [address], [place], [place] [address]    Report Status 12/23/2022 FINAL  Final  Blood culture (routine x 2)     Status: None   Collection Time: 12/18/22 10:30 AM   Specimen: BLOOD  Result Value Ref Range Status   Specimen Description BLOOD SITE NOT SPECIFIED  Final   Special Requests   Final    BOTTLES DRAWN AEROBIC AND ANAEROBIC Blood Culture results may not be optimal due to an excessive volume of blood received in culture bottles   Culture   Final    NO GROWTH 5 DAYS Performed at [facility], [address] [place], [place] [address]  Report Status 12/23/2022 FINAL  Final  MRSA Next Gen by PCR, Nasal     Status: None   Collection Time: 12/18/22  2:35 PM   Specimen: Nasal Mucosa; Nasal Swab  Result Value Ref Range Status   MRSA by PCR Next Gen NOT DETECTED NOT DETECTED Final    Comment: (NOTE) The GeneXpert MRSA Assay (FDA approved for NASAL specimens only), is one component of a comprehensive MRSA colonization surveillance program. It is not intended to diagnose MRSA infection nor to guide or monitor treatment for MRSA infections. Test performance is not FDA  approved in patients less than 36 years old. Performed at [facility], [address], [place], [place] [address]   Respiratory (~20 pathogens) panel by PCR     Status: None   Collection Time: 12/18/22  3:00 PM   Specimen: Nasopharyngeal Swab; Respiratory  Result Value Ref Range Status   Adenovirus NOT DETECTED NOT DETECTED Final   Coronavirus 229E NOT DETECTED NOT DETECTED Final    Comment: (NOTE) The Coronavirus on the Respiratory Panel, DOES NOT test for the novel  Coronavirus (2019 nCoV)    Coronavirus HKU1 NOT DETECTED NOT DETECTED Final   Coronavirus NL63 NOT DETECTED NOT DETECTED Final   Coronavirus OC43 NOT DETECTED NOT DETECTED Final   Metapneumovirus NOT DETECTED NOT DETECTED Final   Rhinovirus / Enterovirus NOT DETECTED NOT DETECTED Final   Influenza A NOT DETECTED NOT DETECTED Final   Influenza B NOT DETECTED NOT DETECTED Final   Parainfluenza Virus 1 NOT DETECTED NOT DETECTED Final   Parainfluenza Virus 2 NOT DETECTED NOT DETECTED Final   Parainfluenza Virus 3 NOT DETECTED NOT DETECTED Final   Parainfluenza Virus 4 NOT DETECTED NOT DETECTED Final   Respiratory Syncytial Virus NOT DETECTED NOT DETECTED Final   Bordetella pertussis NOT DETECTED NOT DETECTED Final   Bordetella Parapertussis NOT DETECTED NOT DETECTED Final   Chlamydophila pneumoniae NOT DETECTED NOT DETECTED Final   Mycoplasma pneumoniae NOT DETECTED NOT DETECTED Final    Comment: Performed at [facility], [address], [place], [place] [address]  Acid Fast Culture with reflexed sensitivities     Status: None   Collection Time: 12/20/22  9:33 AM   Specimen: PATH Cytology Pleural fluid  Result Value Ref Range Status   Acid Fast Culture Negative  Final    Comment: (NOTE) No acid fast bacilli isolated after 6 weeks. Performed At: [facility] [address] [place], [place] [id number] Jolene Schimke MD WG:[id number]    Source of Sample PLEURAL  Final    Comment: Performed at  [facility], [address], [place], [place] [address]  Acid Fast Smear (AFB)     Status: None   Collection Time: 12/20/22  9:33 AM   Specimen: PATH Cytology Pleural fluid  Result Value Ref Range Status   AFB Specimen Processing Concentration  Final   Acid Fast Smear Negative  Final    Comment: (NOTE) Performed At: [facility] [address] [place], [place] [id number] Jolene Schimke MD KG:[id number]    Source (AFB) PLEURAL  Final    Comment: Performed at [facility], [address], [place], [place] [address]  Culture, body fluid w Gram Stain-bottle     Status: None   Collection Time: 12/20/22  9:37 AM   Specimen: Pleura  Result Value Ref Range Status   Specimen Description PLEURAL  Final   Special Requests NONE  Final   Culture  Final    NO GROWTH 5 DAYS Performed at [facility], [address] [place], [place] [address]    Report Status 12/25/2022 FINAL  Final  Gram stain     Status: None   Collection Time: 12/20/22  9:37 AM   Specimen: Pleura  Result Value Ref Range Status   Specimen Description PLEURAL  Final   Special Requests NONE  Final   Gram Stain   Final    WBC SEEN NO ORGANISMS SEEN CYTOSPIN SMEAR Performed at [facility], [address] [place], [place] [address]    Report Status 12/20/2022 FINAL  Final  Expectorated Sputum Assessment w Gram Stain, Rflx to Resp Cult     Status: None   Collection Time: 12/20/22  6:06 PM   Specimen: Expectorated Sputum  Result Value Ref Range Status   Specimen Description EXPECTORATED SPUTUM  Final   Special Requests NONE  Final   Sputum evaluation   Final    THIS SPECIMEN IS ACCEPTABLE FOR SPUTUM CULTURE Performed at [facility], [address] [place], [place] [address]    Report Status 12/20/2022 FINAL  Final  Culture, Respiratory w Gram Stain     Status: None   Collection Time: 12/20/22  6:06 PM  Result Value Ref Range Status   Specimen Description EXPECTORATED SPUTUM   Final   Special Requests NONE Reflexed from [id number]  Final   Gram Stain   Final    FEW WBC PRESENT, PREDOMINANTLY PMN NO ORGANISMS SEEN Performed at [facility], [address], [place], [place] [address]    Culture FEW STAPHYLOCOCCUS HAEMOLYTICUS  Final   Report Status 12/23/2022 FINAL  Final   Organism ID, Bacteria STAPHYLOCOCCUS HAEMOLYTICUS  Final      Susceptibility   Staphylococcus haemolyticus - MIC*    CIPROFLOXACIN <=0.5 SENSITIVE Sensitive     ERYTHROMYCIN >=8 RESISTANT Resistant     GENTAMICIN <=0.5 SENSITIVE Sensitive     OXACILLIN >=4 RESISTANT Resistant     TETRACYCLINE >=16 RESISTANT Resistant     VANCOMYCIN 1 SENSITIVE Sensitive     TRIMETH/SULFA <=10 SENSITIVE Sensitive     CLINDAMYCIN RESISTANT Resistant     RIFAMPIN <=0.5 SENSITIVE Sensitive     Inducible Clindamycin POSITIVE Resistant     * FEW STAPHYLOCOCCUS HAEMOLYTICUS   *Note: Due to a large number of results and/or encounters for the requested time period, some results have not been displayed. A complete set of results can be found in Results Review.    Labs: CBC: Recent Labs  Lab 03/20/23 1515 03/21/23 0442  WBC 2.1* 2.0*  NEUTROABS 1.3*  --   HGB 5.5* 8.4*  HCT 17.1* 24.8*  MCV 90.0 88.9  PLT 96* 94*   Basic Metabolic Panel: Recent Labs  Lab 03/20/23 1515 03/21/23 0442  NA 135 138  K 3.6 4.0  CL 95* 99  CO2 28 26  GLUCOSE 80 84  BUN 29* 36*  CREATININE 3.33* 4.41*  CALCIUM 8.0* 8.7*   Liver Function Tests: Recent Labs  Lab 03/20/23 1515 03/21/23 0442  AST 37 29  ALT 48* 42  ALKPHOS 152* 135*  BILITOT 0.4 0.7  PROT 5.6* 5.5*  ALBUMIN 3.1* 3.2*   CBG: No results for input(s): "GLUCAP" in the last 168 hours.  Discharge time spent: greater than 30 minutes.  This record has been created using Conservation officer, historic buildings. Errors have been sought and corrected,but may not always be located. Such creation  errors do not reflect on the standard of care.    Signed: Arnetha Courser, MD [facility] 03/21/2023

## 2023-03-21 NOTE — Evaluation (Signed)
Physical Therapy Evaluation Patient Details Name: Tina Watkins MRN: [id number] DOB: 1966/02/02 Today's Date: 03/21/2023  History of Present Illness  The pt is a 58 yo female presenting 2/15 with low Hgb, c/o fatigue and weakness. 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   Clinical Impression  Pt in bed upon arrival of PT, agreeable to evaluation at this time. Prior to admission the pt was mobilizing without use of DME, but reports recent purchase of rollator due to poor endurance after recent admission. The pt reports no recent falls, but assist from her spouse for ADLs and with gait after HD treatments. The pt was able to complete bed mobility and short distance ambulation with minA to CGA this session, but was limited in ambulation distance by increased work of breathing and RR (to high of 50 after walking ~15 ft).  She presents with limitations in functional mobility, stability, power, strength, and endurance, and will continue to benefit from skilled PT to address these deficits and allow for safest return home.       If plan is discharge home, recommend the following: A little help with walking and/or transfers;A little help with bathing/dressing/bathroom;Assistance with cooking/housework;Assist for transportation;Help with stairs or ramp for entrance   Can travel by private vehicle        Equipment Recommendations BSC/3in1  Recommendations for Other Services       Functional Status Assessment Patient has had a recent decline in their functional status and demonstrates the ability to make significant improvements in function in a reasonable and predictable amount of time.     Precautions  / Restrictions Precautions Precautions: Fall Recall of Precautions/Restrictions: Intact Precaution/Restrictions Comments: soft BP, on 4L O2 Restrictions Weight Bearing Restrictions Per Provider Order: No      Mobility  Bed Mobility Overal bed mobility: Needs Assistance Bed Mobility: Supine to Sit, Sit to Supine     Supine to sit: Mod assist Sit to supine: Min assist   General bed mobility comments: modA to elevate trunk, minA to return legs to bed    Transfers Overall transfer level: Needs assistance Equipment used: 1 person hand held assist Transfers: Sit to/from Stand Sit to Stand: Min assist, From elevated surface           General transfer comment: minA to rise from high ED stretcher. no overt buckling in stance. minA to CGA    Ambulation/Gait Ambulation/Gait assistance: Min assist, Contact guard assist Gait Distance (Feet): 10 Feet (+ 3ft) Assistive device: 1 person hand held assist, None Gait Pattern/deviations: Step-through pattern, Decreased stride length Gait velocity: decreased Gait velocity interpretation: <1.31 ft/sec, indicative of household ambulator   General Gait Details: small lateral and then forwards and backwards steps in ED room. HHA to no UE support, pt with increased RR (to 50) after walk but SpO2 99-100% on 4L, cued for PLB    Balance Overall balance assessment: Needs assistance Sitting-balance support: No upper extremity supported, Feet supported Sitting balance-Leahy Scale: Good     Standing balance support: Single extremity supported, No upper extremity supported, During functional activity Standing balance-Leahy Scale: Fair Standing balance comment: poor tolerance for challenge but able to stand unassisted  Pertinent Vitals/Pain Pain Assessment Pain Assessment: Faces Faces Pain Scale: Hurts even more Pain Location: bottom Pain Descriptors / Indicators: Sore Pain Intervention(s): Limited  activity within patient's tolerance, Monitored during session, Repositioned    Home Living Family/patient expects to be discharged to:: Private residence Living Arrangements: Spouse/significant other Available Help at Discharge: Family;Available 24 hours/day Type of Home: Apartment Home Access: Level entry       Home Layout: One level Home Equipment: Agricultural consultant (2 wheels);Rollator (4 wheels);Cane - single point Additional Comments: significant other can assist as needed. He sometimes works but does it when she is at HD    Prior Function Prior Level of Function : Independent/Modified Independent;Driving             Mobility Comments: just bought rollator, no falls, limited endurance and poor mobility. spends most of day in the recliner ADLs Comments: spouse assists with dressing,pt sponge bathes at sink     Extremity/Trunk Assessment   Upper Extremity Assessment Upper Extremity Assessment: Defer to OT evaluation    Lower Extremity Assessment Lower Extremity Assessment: Generalized weakness    Cervical / Trunk Assessment Cervical / Trunk Assessment: Normal  Communication   Communication Communication: No apparent difficulties    Cognition Arousal: Alert Behavior During Therapy: Flat affect   PT - Cognitive impairments: No family/caregiver present to determine baseline                       PT - Cognition Comments: pt with flat affect, but answering questions and making needs known appropriately. Following commands: Intact       Cueing Cueing Techniques: Verbal cues     General Comments General comments (skin integrity, edema, etc.): SpO2 99-100% on 4L, pt with increased RR frequently in session (to30 with bed mobility then 50 with gait)        Assessment/Plan    PT Assessment Patient needs continued PT services  PT Problem List Cardiopulmonary status limiting activity;Decreased activity tolerance;Decreased balance;Decreased mobility        PT Treatment Interventions DME instruction;Gait training;Functional mobility training;Therapeutic exercise;Therapeutic activities;Balance training    PT Goals (Current goals can be found in the Care Plan section)  Acute Rehab PT Goals Patient Stated Goal: return home, remain independent PT Goal Formulation: With patient Time For Goal Achievement: 04/04/23 Potential to Achieve Goals: Good    Frequency Min 1X/week        AM-PAC PT "6 Clicks" Mobility  Outcome Measure Help needed turning from your back to your side while in a flat bed without using bedrails?: A Little Help needed moving from lying on your back to sitting on the side of a flat bed without using bedrails?: A Little Help needed moving to and from a bed to a chair (including a wheelchair)?: A Little Help needed standing up from a chair using your arms (e.g., wheelchair or bedside chair)?: A Little Help needed to walk in hospital room?: Total (<20 ft) Help needed climbing 3-5 steps with a railing? : A Lot 6 Click Score: 15    End of Session Equipment Utilized During Treatment: Gait belt;Oxygen Activity Tolerance: Patient tolerated treatment well;Patient limited by fatigue Patient left: in bed;with call bell/phone within reach Nurse Communication: Mobility status PT Visit Diagnosis: Muscle weakness (generalized) (M62.81);Unsteadiness on feet (R26.81);Difficulty in walking, not elsewhere classified (R26.2)    Time: 1005-1037 PT Time Calculation (min) (ACUTE ONLY): 32 min   Charges:   PT Evaluation $PT Eval Moderate Complexity: 1 Mod PT  Treatments $Therapeutic Activity: 8-22 mins PT General Charges $$ ACUTE PT VISIT: 1 Visit         Vickki Muff, PT, DPT   Acute Rehabilitation Department Office [phone] Secure Chat Communication Preferred  Ronnie Derby 03/21/2023, 12:04 PM

## 2023-03-25 ENCOUNTER — Telehealth: Payer: Self-pay

## 2023-03-25 ENCOUNTER — Telehealth: Payer: Self-pay | Admitting: Internal Medicine

## 2023-03-25 NOTE — Telephone Encounter (Signed)
[facility] Clinical Research Coordinator note:   This visit for Subject Tina Watkins with DOB: 17-Oct-1965 on 03/25/2023. Subject contacted regarding [id number] to inform that Principal Investigator has changed to Dr. Delton Coombes. Patient expressed understanding.

## 2023-03-25 NOTE — Telephone Encounter (Signed)
Copied from CRM [id number]. Topic: General - Other >> Mar 25, 2023 11:01 AM Tina Watkins wrote: Reason for CRM: Physical therapy rehab mgr calling to give Dr. Tanda Rockers an Lorain Childes about the delay of getting started with the Home Health PT for the patient. Please follow up.  FYI pt delayed start of care until 03/26/23. KH

## 2023-03-25 NOTE — Addendum Note (Signed)
Encounter addended by: Edward Qualia on: 03/25/2023 9:11 AM  Actions taken: Imaging Exam ended

## 2023-03-26 ENCOUNTER — Other Ambulatory Visit: Payer: Self-pay | Admitting: Student

## 2023-03-26 ENCOUNTER — Other Ambulatory Visit: Payer: Self-pay | Admitting: Diagnostic Radiology

## 2023-03-29 ENCOUNTER — Telehealth: Payer: Self-pay | Admitting: Nurse Practitioner

## 2023-03-29 ENCOUNTER — Other Ambulatory Visit (HOSPITAL_COMMUNITY): Payer: Self-pay | Admitting: Nephrology

## 2023-03-29 ENCOUNTER — Ambulatory Visit (HOSPITAL_COMMUNITY)
Admission: RE | Admit: 2023-03-29 | Discharge: 2023-03-29 | Disposition: A | Discharge status: Home / Self Care | Payer: Medicare Other | Source: Ambulatory Visit | Attending: Registered Nurse | Admitting: Registered Nurse

## 2023-03-29 DIAGNOSIS — N186 End stage renal disease: Secondary | ICD-10-CM

## 2023-03-29 DIAGNOSIS — Z4901 Encounter for fitting and adjustment of extracorporeal dialysis catheter: Secondary | ICD-10-CM | POA: Insufficient documentation

## 2023-03-29 HISTORY — PX: IR VENOCAVAGRAM IVC: IMG678

## 2023-03-29 HISTORY — PX: IR FLUORO GUIDE CV LINE RIGHT: IMG2283

## 2023-03-29 MED ORDER — HEPARIN SODIUM (PORCINE) 1000 UNIT/ML IJ SOLN
4000.0000 [IU] | Freq: Once | INTRAMUSCULAR | Status: AC
  Administered 2023-03-29: 4.6 mL via INTRAVENOUS

## 2023-03-29 MED ORDER — HEPARIN SODIUM (PORCINE) 1000 UNIT/ML IJ SOLN
INTRAMUSCULAR | Status: AC
  Filled 2023-03-29: qty 10

## 2023-03-29 MED ORDER — CEFAZOLIN SODIUM-DEXTROSE 2-4 GM/100ML-% IV SOLN
INTRAVENOUS | Status: AC
  Filled 2023-03-29: qty 100

## 2023-03-29 MED ORDER — CHLORHEXIDINE GLUCONATE 4 % EX SOLN
CUTANEOUS | Status: AC
  Filled 2023-03-29: qty 15

## 2023-03-29 MED ORDER — LIDOCAINE HCL 1 % IJ SOLN
INTRAMUSCULAR | Status: AC
  Filled 2023-03-29: qty 20

## 2023-03-29 MED ORDER — CEFAZOLIN SODIUM-DEXTROSE 2-4 GM/100ML-% IV SOLN
2.0000 g | INTRAVENOUS | Status: AC
  Administered 2023-03-29: 2 g via INTRAVENOUS

## 2023-03-29 MED ORDER — LIDOCAINE-EPINEPHRINE 1 %-1:100000 IJ SOLN
20.0000 mL | Freq: Once | INTRAMUSCULAR | Status: AC
  Administered 2023-03-29: 20 mL

## 2023-03-29 MED ORDER — LIDOCAINE-EPINEPHRINE 1 %-1:100000 IJ SOLN
INTRAMUSCULAR | Status: AC
  Filled 2023-03-29: qty 1

## 2023-03-29 MED ORDER — IOHEXOL 300 MG/ML  SOLN
50.0000 mL | Freq: Once | INTRAMUSCULAR | Status: AC | PRN
Start: 2023-03-29 — End: 2023-03-29
  Administered 2023-03-29: 30 mL via INTRAVENOUS

## 2023-03-29 NOTE — Telephone Encounter (Signed)
 Copied from CRM [id number]. Topic: Clinical - Home Health Verbal Orders >> Mar 29, 2023  3:17 PM Adelina Mings wrote: Caller/Agency: [facility]  Callback Number: [phone] Service Requested: Physical Therapy Frequency: 2x a week for 3 weeks 1x a week for 3 weeks  Any new concerns about the patient? No

## 2023-03-29 NOTE — Procedures (Signed)
 Interventional Radiology Procedure Note  Procedure: RT FEM HD CATH EXCHG  WITH IVC VENOGRAM   Complications: None  Estimated Blood Loss:  MIN  Findings: TIP NOW [facility] FULL REPORT IN PACS     Sharen Counter, MD

## 2023-03-31 ENCOUNTER — Ambulatory Visit: Payer: Medicare Other | Admitting: Emergency Medicine

## 2023-03-31 ENCOUNTER — Inpatient Hospital Stay: Payer: Medicare Other

## 2023-03-31 ENCOUNTER — Encounter: Payer: Self-pay | Admitting: Oncology

## 2023-03-31 ENCOUNTER — Other Ambulatory Visit: Payer: Self-pay

## 2023-03-31 ENCOUNTER — Telehealth: Payer: Self-pay | Admitting: Emergency Medicine

## 2023-03-31 ENCOUNTER — Inpatient Hospital Stay (HOSPITAL_BASED_OUTPATIENT_CLINIC_OR_DEPARTMENT_OTHER): Payer: Medicare Other | Admitting: Physician Assistant

## 2023-03-31 VITALS — BP 92/48 | HR 95 | Temp 97.4°F | Resp 17 | Ht 59.0 in | Wt 109.4 lb

## 2023-03-31 DIAGNOSIS — D696 Thrombocytopenia, unspecified: Secondary | ICD-10-CM | POA: Diagnosis not present

## 2023-03-31 DIAGNOSIS — N186 End stage renal disease: Secondary | ICD-10-CM | POA: Diagnosis not present

## 2023-03-31 DIAGNOSIS — C3412 Malignant neoplasm of upper lobe, left bronchus or lung: Secondary | ICD-10-CM | POA: Diagnosis not present

## 2023-03-31 DIAGNOSIS — D631 Anemia in chronic kidney disease: Secondary | ICD-10-CM

## 2023-03-31 DIAGNOSIS — Z452 Encounter for adjustment and management of vascular access device: Secondary | ICD-10-CM

## 2023-03-31 DIAGNOSIS — Z5112 Encounter for antineoplastic immunotherapy: Secondary | ICD-10-CM | POA: Diagnosis not present

## 2023-03-31 DIAGNOSIS — Z5111 Encounter for antineoplastic chemotherapy: Secondary | ICD-10-CM

## 2023-03-31 DIAGNOSIS — I9589 Other hypotension: Secondary | ICD-10-CM

## 2023-03-31 DIAGNOSIS — Z992 Dependence on renal dialysis: Secondary | ICD-10-CM

## 2023-03-31 LAB — CMP (CANCER CENTER ONLY)
ALT: 22 U/L (ref 0–44)
AST: 43 U/L — ABNORMAL HIGH (ref 15–41)
Albumin: 3.3 g/dL — ABNORMAL LOW (ref 3.5–5.0)
Alkaline Phosphatase: 326 U/L — ABNORMAL HIGH (ref 38–126)
Anion gap: 11 (ref 5–15)
BUN: 22 mg/dL — ABNORMAL HIGH (ref 6–20)
CO2: 31 mmol/L (ref 22–32)
Calcium: 9.1 mg/dL (ref 8.9–10.3)
Chloride: 93 mmol/L — ABNORMAL LOW (ref 98–111)
Creatinine: 4.06 mg/dL — ABNORMAL HIGH (ref 0.44–1.00)
GFR, Estimated: 12 mL/min — ABNORMAL LOW (ref >60)
Glucose, Bld: 84 mg/dL (ref 70–99)
Potassium: 4.2 mmol/L (ref 3.5–5.1)
Sodium: 135 mmol/L (ref 135–145)
Total Bilirubin: 0.6 mg/dL (ref 0.0–1.2)
Total Protein: 5.8 g/dL — ABNORMAL LOW (ref 6.5–8.1)

## 2023-03-31 LAB — CBC WITH DIFFERENTIAL (CANCER CENTER ONLY)
Abs Immature Granulocytes: 0.09 10*3/uL — ABNORMAL HIGH (ref 0.00–0.07)
Basophils Absolute: 0 10*3/uL (ref 0.0–0.1)
Basophils Relative: 0 %
Eosinophils Absolute: 0 10*3/uL (ref 0.0–0.5)
Eosinophils Relative: 0 %
HCT: 22 % — ABNORMAL LOW (ref 36.0–46.0)
Hemoglobin: 7 g/dL — ABNORMAL LOW (ref 12.0–15.0)
Immature Granulocytes: 1 %
Lymphocytes Relative: 14 %
Lymphs Abs: 0.9 10*3/uL (ref 0.7–4.0)
MCH: 29.8 pg (ref 26.0–34.0)
MCHC: 31.8 g/dL (ref 30.0–36.0)
MCV: 93.6 fL (ref 80.0–100.0)
Monocytes Absolute: 0.6 10*3/uL (ref 0.1–1.0)
Monocytes Relative: 9 %
Neutro Abs: 5.2 10*3/uL (ref 1.7–7.7)
Neutrophils Relative %: 76 %
Platelet Count: 68 10*3/uL — ABNORMAL LOW (ref 150–400)
RBC: 2.35 MIL/uL — ABNORMAL LOW (ref 3.87–5.11)
RDW: 17 % — ABNORMAL HIGH (ref 11.5–15.5)
WBC Count: 6.9 10*3/uL (ref 4.0–10.5)
nRBC: 0 % (ref 0.0–0.2)

## 2023-03-31 LAB — PREPARE RBC (CROSSMATCH)

## 2023-03-31 MED ORDER — HEPARIN SOD (PORK) LOCK FLUSH 100 UNIT/ML IV SOLN
250.0000 [IU] | INTRAVENOUS | Status: AC | PRN
  Administered 2023-03-31: 250 [IU]

## 2023-03-31 MED ORDER — DIPHENHYDRAMINE HCL 25 MG PO CAPS
25.0000 mg | ORAL_CAPSULE | Freq: Once | ORAL | Status: AC
  Administered 2023-03-31: 25 mg via ORAL
  Filled 2023-03-31: qty 1

## 2023-03-31 MED ORDER — SODIUM CHLORIDE 0.9% FLUSH
10.0000 mL | INTRAVENOUS | Status: AC | PRN
  Administered 2023-03-31: 10 mL

## 2023-03-31 MED ORDER — ACETAMINOPHEN 325 MG PO TABS
650.0000 mg | ORAL_TABLET | Freq: Once | ORAL | Status: AC
  Administered 2023-03-31: 650 mg via ORAL
  Filled 2023-03-31: qty 2

## 2023-03-31 MED ORDER — SODIUM CHLORIDE 0.9% IV SOLUTION
250.0000 mL | INTRAVENOUS | Status: DC
  Administered 2023-03-31: 250 mL via INTRAVENOUS

## 2023-03-31 MED ORDER — SODIUM CHLORIDE 0.9% FLUSH
10.0000 mL | Freq: Once | INTRAVENOUS | Status: AC
Start: 2023-03-31 — End: 2023-03-31
  Administered 2023-03-31: 10 mL

## 2023-03-31 MED ORDER — HEPARIN SOD (PORK) LOCK FLUSH 100 UNIT/ML IV SOLN
500.0000 [IU] | Freq: Once | INTRAVENOUS | Status: AC
  Administered 2023-03-31: 500 [IU]

## 2023-03-31 NOTE — Progress Notes (Signed)
 [facility]  ONCOLOGY CLINIC PROGRESS NOTE   Patient Care Team: Ivonne Andrew, NP as PCP - General (Pulmonary Disease) O'Neal, Ronnald Ramp, MD as PCP - Cardiology (Cardiology) Bensimhon, Bevelyn Buckles, MD as PCP - Advanced Heart Failure (Cardiology) Lytle Butte, RN as Oncology Nurse Navigator  PATIENT NAME: Tina Watkins   MR#: [id number] DOB: 06-May-1965  Date of visit: 03/31/2023  CHIEF COMPLAINT/ REASON FOR VISIT:   Lung adenocarcinoma with multiple bilateral lung nodules, 1 intracranial metastasis.  Stage IV disease.  Also has adenocarcinoma in the pancreas, primary versus metastatic from lung.   Current Treatment: Plan made for palliative systemic chemoimmunotherapy with carboplatin, paclitaxel and pembrolizumab.  Pemetrexed could not be used because of hemodialysis status.  Started treatments from 02/12/2023 using peripheral IV.  She cannot get PICC line or Port-A-Cath placement because of bad vasculopathy.  HISTORY OF PRESENT ILLNESS:   ONCOLOGY HISTORY:    Patient was seen by pulmonology after she was diagnosed with COVID-19 infection in the ED on 09/14/2022.  Because of her chronic smoking history with at least 51-pack-year smoking, a low-dose CT of the chest was obtained for lung cancer screening.  On 09/28/2022, CT chest showed dominant new solid peripheral left upper lobe lung nodule measuring 21 mm.  Several new clustered solid pulmonary nodules throughout left upper lobe, largest measuring 5 mm.  Numerous additional tiny 2 to 3 mm solid pulmonary nodules scattered in both lungs, new finding.  Findings concerning for primary bronchogenic malignancy.  Lung RADS 4B, suspicious.  Also noted was cystic pancreatic tail lesions, largest measuring 2.6 cm posteriorly, indeterminate.  MRI abdomen recommended for further evaluation.   Because of CT chest findings, PET/CT was obtained on 10/19/2022 and it showed 2.6 cm subpleural nodule in the left upper lobe  with SUV max of 4.9.  Primary bronchogenic neoplasm could not be excluded.  Tissue sampling was recommended.  Also noted was 15 mm short axis prevascular lymph node with SUV of 2.4, new compared to 2023.  Indeterminate.  Small left pleural effusion was also noted.   Several subcentimeter lung nodules were noted on CT scan . They are below the threshold by size, for determining FDG activity and hence were not apparent on PET scan.    MRI of the abdomen on 11/11/2022: Evaluation of the pancreatic tail was limited by metallic susceptibility artifact from adjacent surgical clips as well as iron burden in the adjacent stomach parenchyma.  There was a complex, multiseptated, internally heterogeneous cystic lesion arising from the tip of the pancreatic tail measuring 4.4 x 2.3 x 3.2 cm.  Contrast enhancement could not be confirmed due to technical limitations.  Appearance was concerning for a partially cystic malignancy with a significant solid component.  Tissue sampling was recommended.  For future, recommended multiphasic contrast-enhanced CT.  Severe hepatic and splenic iron deposition was noted.  Coarse, nodular contour of the liver, consistent with cirrhosis.  Status post bilateral native nephrectomy.  Right lower quadrant renal transplant graft was noted.  Diffusely heterogeneous bone marrow, reflecting marrow reconversion renal osteodystrophy.   On 11/11/2022, she had multidetector CT imaging of the chest using thin slice collimation for electromagnetic bronchoscopy planning purposes.  It showed similar findings compared to prior imaging.   On 11/16/2022, Dr. Delton Coombes performed robotic assisted navigational bronchoscopy and biopsies.  Left upper lobe lung nodule needle aspiration biopsy showed presence of malignant cells.  Immunostains were obtained at a later date and showed positivity for TTF-1 and  negative for p40 and cytokeratin 5/6, consistent with lung adenocarcinoma.  Endobronchial brushings and brushings  from left upper lobe lung nodule showed no malignant cells.   She was admitted to the hospital on 12/18/2022 after she presented with shortness of breath, cough with yellow sputum, chills, myalgias and diarrhea.  She was noted to be in acute hypoxic respiratory failure, related to postobstructive pneumonia.  CT angiogram of the chest on 12/18/2022 showed no evidence of pulmonary embolism.  It did show numerous small nodules throughout right lung which appeared to be increased in number compared to prior study.  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  Stable mediastinal lymphadenopathy.   Underwent left-sided thoracentesis on 12/20/2022.  Pleural fluid cytology showed no evidence of malignant cells.   NGS testing showed no actionable mutations.  Plan made for palliative chemoimmunotherapy with carboplatin, paclitaxel and pembrolizumab.    Port-A-Cath could not be placed because of bad vasculopathy.  She could not even receive PICC line placement and ended up getting midline placed in the left arm.  Presented to clinic on 02/12/2023 for initiation of treatments.  Her hemoglobin was 7 and she had to be given 1 unit of PRBC transfusion.  We proceeded with pembrolizumab that day.    Started carboplatin and paclitaxel at a later date on 02/17/2023.  Chemotherapy is being given through peripheral IV using help of vascular access team.  -Pancreatic lesion biopsy at [facility] on 02/10/2023 came back positive for adenocarcinoma.  Unsure if it is primary pancreatic or metastatic from lung.  Immunostains were not obtained and we have submitted request for this.  MRI of the brain from 01/29/2023 showed 1.2 cm enhancing mass in the left insula, consistent with metastatic disease.  Started her on Decadron 4 mg p.o. twice daily and referral sent to radiation oncology for further  evaluation and management.  She was evaluated by Dr. Mitzi Hansen.  Plan for SRS in a single session on 03/10/2023.  Recent CT of the chest incidentally also showed liver lesions which are new and concerning for metastatic disease.    On 02/22/2023, CT abdomen and pelvis showed similar findings but indeterminate.  Metastatic disease could not be ruled out.  Following results of pancreatic biopsy immunostains, we will consider liver biopsy to determine etiology of liver lesions-lung versus pancreatic primary.   Oncology History  Lung cancer, upper lobe (HCC)  10/28/2022 Initial Diagnosis   Lung cancer, upper lobe (HCC)   01/11/2023 Cancer Staging   Staging form: Lung, AJCC 8th Edition - Clinical: Stage IVA (cTX, cNX, cM1a) - Signed by Meryl Crutch, MD on 01/11/2023   02/12/2023 -  Chemotherapy   Patient is on Treatment Plan : LUNG NSCLC Carboplatin (6) + Paclitaxel (200) + Pembrolizumab (200) D1 q21d x 4 cycles / Pembrolizumab (200) Maintenance D1 q21d         INTERVAL HISTORY:    Tina Watkins is here today for a follow up before Cycle 4, Day 1 scheduled for this Friday.   Ms. Rybolt reports having worsening fatigue since Monday after undergoing HD catheter exchange. She reports worsening shortness of breath requiring her to increase her supplemental oxygen from 3L to 4L. She denies nausea, vomiting or bowel habit changes. She denies any bruising or bleeding episodes. She has noticed a productive cough but no fevers or chills. She reports her low back pain is improving over  the last couple of weeks. She has no other complaints. Rest of the 10 point ROS is below.    I have reviewed the past medical history, past surgical history, social history and family history with the patient and they are unchanged from previous note.  REVIEW OF SYSTEMS:   Review of Systems  Constitutional:  Positive for fatigue. Negative for appetite change, chills and fever.  Respiratory:  Positive for cough and  shortness of breath.   Cardiovascular:  Negative for chest pain.  Gastrointestinal:  Negative for abdominal pain, blood in stool, constipation, diarrhea, nausea and vomiting.  Musculoskeletal:  Positive for back pain.  Skin:  Negative for rash.  Neurological:  Negative for dizziness and headaches.  Hematological:  Does not bruise/bleed easily.  Psychiatric/Behavioral: Negative.      All other pertinent systems were reviewed with the patient and are negative.  ALLERGIES: She is allergic to levofloxacin and tape.  MEDICATIONS:  Current Outpatient Medications  Medication Sig Dispense Refill   allopurinol (ZYLOPRIM) 100 MG tablet Take 0.5 tablets (50 mg total) by mouth 2 (two) times a week. (Patient taking differently: Take 100 mg by mouth daily in the afternoon.) 90 tablet 3   amiodarone (PACERONE) 200 MG tablet Take 0.5 tablets (100 mg total) by mouth daily. 90 tablet 1   apixaban (ELIQUIS) 2.5 MG TABS tablet Take 1 tablet (2.5 mg total) by mouth 2 (two) times daily. Okay to restart this medication on 11/18/2022     AURYXIA 1 GM 210 MG(Fe) tablet Take 420 mg by mouth 3 (three) times daily. (Patient not taking: Reported on 03/20/2023)     calcium-vitamin D (OSCAL WITH D) 500-5 MG-MCG tablet Take 1 tablet by mouth 2 (two) times daily. 90 tablet 1   Colchicine 0.6 MG CAPS Take 0.6 mg by mouth daily as needed (gout flare up). 30 capsule 2   cyclobenzaprine (FLEXERIL) 5 MG tablet Take 1 tablet (5 mg total) by mouth at bedtime as needed. 90 tablet 1   dexamethasone (DECADRON) 4 MG tablet Take 2 tablets (8mg ) by mouth daily starting the day after carboplatin for 3 days. Take with food (Patient taking differently: Take 2 mg by mouth daily.) 30 tablet 1   diphenhydrAMINE (BENADRYL) 25 mg capsule Take 1 capsule (25 mg total) by mouth every 8 (eight) hours as needed. (Patient taking differently: Take 25 mg by mouth every 8 (eight) hours as needed for allergies or sleep.) 30 capsule 6    dorzolamide-timolol (COSOPT) 2-0.5 % ophthalmic solution Place 1 drop into the left eye every evening.     famotidine (PEPCID) 20 MG tablet Take 1 tablet (20 mg total) by mouth 2 (two) times daily. 180 tablet 3   fluticasone-salmeterol (ADVAIR HFA) 115-21 MCG/ACT inhaler Inhale 2 puffs into the lungs 2 (two) times daily. 1 each 12   gabapentin (NEURONTIN) 100 MG capsule Take 100 mg by mouth 3 (three) times daily as needed (nerve pain).     Lactulose 20 GM/30ML SOLN Take 30 mLs (20 g total) by mouth 2 (two) times daily as needed (constipation). 450 mL 2   metoCLOPramide (REGLAN) 10 MG tablet Take 1 tablet (10 mg total) by mouth 3 (three) times daily before meals. (Patient taking differently: Take 10 mg by mouth daily in the afternoon.) 90 tablet 2   midodrine (PROAMATINE) 10 MG tablet TAKE 1.5 TABLETS (15 MG TOTAL) BY MOUTH 3 (THREE) TIMES DAILY. NEEDS FOLLOW UP APPOINTMENT FOR ANYMORE REFILLS 405 tablet 0   omeprazole (PRILOSEC) 40  MG capsule TAKE 1 CAPSULE (40 MG TOTAL) BY MOUTH DAILY. 90 capsule 1   ondansetron (ZOFRAN) 8 MG tablet Take 1 tablet (8 mg total) by mouth every 8 (eight) hours as needed for nausea or vomiting. Start on the third day after carboplatin. 30 tablet 1   oxyCODONE-acetaminophen (PERCOCET) 10-325 MG tablet Take 1 tablet by mouth 4 (four) times daily as needed for pain.     sertraline (ZOLOFT) 25 MG tablet Take 1 tablet (25 mg total) by mouth daily. 30 tablet 3   VELPHORO 500 MG chewable tablet Chew 500 mg by mouth See admin instructions. Take one tablet by mouth three times a daily with meals and snakes per patient     zolpidem (AMBIEN) 10 MG tablet Take 1 tablet (10 mg total) by mouth at bedtime as needed for sleep. 10 tablet 0   No current facility-administered medications for this visit.     VITALS:   There were no vitals taken for this visit.  Wt Readings from Last 3 Encounters:  03/12/23 105 lb 3.2 oz (47.7 kg)  02/27/23 107 lb (48.5 kg)  02/26/23 107 lb (48.5 kg)     There is no height or weight on file to calculate BMI.  Performance status (ECOG): 2 - Symptomatic, <50% confined to bed  PHYSICAL EXAM:   Physical Exam Constitutional:      General: She is not in acute distress. HENT:     Head: Normocephalic and atraumatic.  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     Heart sounds: Normal heart sounds.  Pulmonary:     Effort: Pulmonary effort is normal.  Musculoskeletal:     Right lower leg: No edema.     Left lower leg: No edema.     Comments: Left arm midline in place  Neurological:     Mental Status: She is alert and oriented to person, place, and time.  Psychiatric:        Mood and Affect: Mood normal.        Behavior: Behavior normal.        Thought Content: Thought content normal.       LABORATORY DATA:   I have reviewed the data as listed.  Results for orders placed or performed in visit on 03/31/23  CBC with Differential (Cancer Center Only)  Result Value Ref Range   WBC Count 6.9 4.0 - 10.5 K/uL   RBC 2.35 (L) 3.87 - 5.11 MIL/uL   Hemoglobin 7.0 (L) 12.0 - 15.0 g/dL   HCT 16.1 (L) 09.6 - 04.5 %   MCV 93.6 80.0 - 100.0 fL   MCH 29.8 26.0 - 34.0 pg   MCHC 31.8 30.0 - 36.0 g/dL   RDW 40.9 (H) 81.1 - 91.4 %   Platelet Count 68 (L) 150 - 400 K/uL   nRBC 0.0 0.0 - 0.2 %   Neutrophils Relative % PENDING %   Neutro Abs PENDING 1.7 - 7.7 K/uL   Band Neutrophils PENDING %   Lymphocytes Relative PENDING %   Lymphs Abs PENDING 0.7 - 4.0 K/uL   Monocytes Relative PENDING %   Monocytes Absolute PENDING 0.1 - 1.0 K/uL   Eosinophils Relative PENDING %   Eosinophils Absolute PENDING 0.0 - 0.5 K/uL   Basophils Relative PENDING %   Basophils Absolute PENDING 0.0 - 0.1 K/uL   WBC Morphology PENDING    RBC  Morphology PENDING    Smear Review PENDING    Other PENDING %   nRBC PENDING 0 /100 WBC   Metamyelocytes Relative PENDING %   Myelocytes PENDING %    Promyelocytes Relative PENDING %   Blasts PENDING %   Immature Granulocytes PENDING %   Abs Immature Granulocytes PENDING 0.00 - 0.07 K/uL     RADIOGRAPHIC STUDIES:  I have personally reviewed the radiological images as listed and agree with the findings in the report.  IR Fluoro Guide CV Line Right Result Date: 03/29/2023 INDICATION: End-stage renal disease, poorly functioning chronic right femoral HD catheter EXAM: FLUOROSCOPIC EXCHANGE OF THE RIGHT FEMORAL HD CATHETER IVC CATHETERIZATION AND VENOGRAM MEDICATIONS: ANCEF 2 G; The antibiotic was administered within an appropriate time interval prior to skin puncture. ANESTHESIA/SEDATION: NONE. FLUOROSCOPY: Radiation Exposure Index (as provided by the fluoroscopic device): 99 mGy Kerma COMPLICATIONS: None immediate. PROCEDURE: Informed written consent was obtained from the patient after a thorough discussion of the procedural risks, benefits and alternatives. All questions were addressed. Maximal Sterile Barrier Technique was utilized including caps, mask, sterile gowns, sterile gloves, sterile drape, hand hygiene and skin antiseptic. A timeout was performed prior to the initiation of the procedure. Initial fluoroscopic survey demonstrates a right femoral HD catheter. Tip terminates at the lower IVC below the renal veins. Catheter removed over a Glidewire. A 12 French sheath was advanced. IVC venogram performed. IVC is irregular and diffusely stenotic with azygous collateralization. Kumpe catheter and Glidewire advanced through the sheath. Access manipulated through the multifocal IVC irregularity and stenoses. Catheter and guidewire access advanced to successfully regain access into the right atrium. Contrast injection confirms position in the right atrium. Measurements obtained for the appropriate catheter length. Over the Amplatz and stiff Glidewire, a 36 cm palindrome catheter was advanced with some difficulty. Tip positioned eventually in the  inferior right atrium. Contrast injection confirms position. Blood aspirated easily followed by saline and heparin flushes. Appropriate volume and strength of heparin instilled in all lumens. Catheter secured at the right femoral access site with a Prolene suture and a sterile dressing. No immediate complication. Patient tolerated the procedure well. IMPRESSION: Successful exchange and reposition of the tunneled right femoral HD catheter. Tip now terminates in the inferior right atrium. Electronically Signed   By: Judie Petit.  Shick M.D.   On: 03/29/2023 12:09   IR Venocavagram Ivc Result Date: 03/29/2023 INDICATION: End-stage renal disease, poorly functioning chronic right femoral HD catheter EXAM: FLUOROSCOPIC EXCHANGE OF THE RIGHT FEMORAL HD CATHETER IVC CATHETERIZATION AND VENOGRAM MEDICATIONS: ANCEF 2 G; The antibiotic was administered within an appropriate time interval prior to skin puncture. ANESTHESIA/SEDATION: NONE. FLUOROSCOPY: Radiation Exposure Index (as provided by the fluoroscopic device): 99 mGy Kerma COMPLICATIONS: None immediate. PROCEDURE: Informed written consent was obtained from the patient after a thorough discussion of the procedural risks, benefits and alternatives. All questions were addressed. Maximal Sterile Barrier Technique was utilized including caps, mask, sterile gowns, sterile gloves, sterile drape, hand hygiene and skin antiseptic. A timeout was performed prior to the initiation of the procedure. Initial fluoroscopic survey demonstrates a right femoral HD catheter. Tip terminates at the lower IVC below the renal veins. Catheter removed over a Glidewire. A 12 French sheath was advanced. IVC venogram performed. IVC is irregular and diffusely stenotic with azygous collateralization. Kumpe catheter and Glidewire advanced through the sheath. Access manipulated through the multifocal IVC irregularity and stenoses. Catheter and guidewire access advanced to successfully regain access into the  right atrium. Contrast injection confirms position  in the right atrium. Measurements obtained for the appropriate catheter length. Over the Amplatz and stiff Glidewire, a 36 cm palindrome catheter was advanced with some difficulty. Tip positioned eventually in the inferior right atrium. Contrast injection confirms position. Blood aspirated easily followed by saline and heparin flushes. Appropriate volume and strength of heparin instilled in all lumens. Catheter secured at the right femoral access site with a Prolene suture and a sterile dressing. No immediate complication. Patient tolerated the procedure well. IMPRESSION: Successful exchange and reposition of the tunneled right femoral HD catheter. Tip now terminates in the inferior right atrium. Electronically Signed   By: Judie Petit.  Shick M.D.   On: 03/29/2023 12:09   CT PELVIS WO CONTRAST Result Date: 03/20/2023 CLINICAL DATA:  coccyx/sacral TTP, midline Review of radiologic records indicates history of stage IV lung cancer. EXAM: CT PELVIS WITHOUT CONTRAST TECHNIQUE: Multidetector CT imaging of the pelvis was performed following the standard protocol without intravenous contrast. RADIATION DOSE REDUCTION: This exam was performed according to the departmental dose-optimization program which includes automated exposure control, adjustment of the mA and/or kV according to patient size and/or use of iterative reconstruction technique. COMPARISON:  CT 02/27/2023 FINDINGS: Urinary Tract: Decompressed urinary bladder, diffuse bladder wall thickening. 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 Bowel: No inflammation of pelvic bowel loops. Moderate volume of colonic stool. Vascular/Lymphatic: Extensive arterial vascular calcifications. Right-sided dialysis catheter, extending beyond the field of view. No pelvic adenopathy. Reproductive: Soft tissue prominence in the region of the cervix. Otherwise unremarkable. Other:  No pelvic ascites.  Mild generalized  body wall edema. 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 No evidence of fracture or focal bone lesion. No bony destructive change. No intramuscular collection in the absence of IV contrast. IMPRESSION: 1. No evidence of metastatic disease in the pelvis. No explanation for sacrococcygeal tenderness. 2. Soft tissue prominence in the region of the cervix, recommend correlation with physical exam. 3. 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 4. Right femoral head avascular necrosis. 5. Mild generalized body wall edema. Electronically Signed   By: Narda Rutherford M.D.   On: 03/20/2023 18:52   DG Chest Portable 1 View Result Date: 03/20/2023 CLINICAL DATA:  Dyspnea EXAM: PORTABLE CHEST 1 VIEW COMPARISON:  02/27/2023. FINDINGS: Left hemithorax is opacified. Pulmonary interstitial prominence with a nodular configuration of the right hemithorax. Metastatic disease not excluded. No pneumothorax. Stent overlies the right hilar region. Osseous structures appear intact. IMPRESSION: Nodular interstitial process on the right. Completely opacified left hemithorax. Electronically Signed   By: Layla Maw M.D.   On: 03/20/2023 18:27   MR BRAIN W WO CONTRAST Result Date: 03/01/2023 CLINICAL DATA:  Brain/CNS neoplasm, staging. EXAM: MRI HEAD WITHOUT AND WITH CONTRAST TECHNIQUE: Multiplanar, multiecho pulse sequences of the brain and surrounding structures were obtained without and with intravenous contrast. CONTRAST:  5mL GADAVIST GADOBUTROL 1 MMOL/ML IV SOLN COMPARISON:  MRI of the brain January 29, 2023. FINDINGS: Brain: There has been interval growth of the left insular enhancing lesion, measuring approximately 15 x 11 mm compared to 11 x 9 mm on prior. There is no surrounding edema. No other lesion identified. Areas of encephalomalacia and gliosis with associated hemosiderin deposit in the right parietal and occipital lobes. Small remote infarcts in the left basal ganglia  and right centrum semiovale. Hemosiderin deposit in the choroid plexus along the lateral ventricles and fourth ventricle suggest prior hemorrhage. Scattered and confluent foci T2 hyperintensity are seen within the white matter the cerebral hemispheres nonspecific. No  acute infarction, hemorrhage, hydrocephalus or extra-axial collection. Vascular: Normal flow voids. Skull and upper cervical spine: Diffuse decrease of the T1 signal of the calvarium and clivus, likely related to chronic anemia. Sinuses/Orbits: Right phthisis bulbi.  Paranasal sinuses are clear. Other: None. IMPRESSION: Interval growth of the left insular enhancing lesion, measuring approximately 15 x 11 mm compared to 11 x 9 mm on prior. No surrounding edema. No other lesion identified. Electronically Signed   By: Baldemar Lenis M.D.   On: 03/01/2023 15:54    ASSESSMENT & PLAN:   ONYX EDGLEY is a 58 y.o. lady with multiple medical comorbidities including kidney transplant, currently ESRD on HD, congestive heart failure, atrial fibrillation/flutter, COPD, depression, chronic gout, chronic pain, anemia of chronic disease, gastroparesis, midodrine dependent hypotension, history of strokes, suspected mesenteric ischemia, was recently diagnosed with malignant lung nodule on 11/16/2022 via bronchoscopy.  Immunostains suggestive of adenocarcinoma.   #Lung adenocarcinoma, upper lobe: --See oncologic history as above.  --Presented at tumor conference on 01/07/2023. Consensus opinion is that these lung nodules are malignancy related.  Hence clinically it appears to be stage IV disease.  --MRI of the brain from 01/29/2023 showed 1.2 cm enhancing mass in the left insula, consistent with metastatic disease.  Started her on Decadron 4 mg p.o. twice daily and referral sent to radiation oncology for further evaluation and management.  She was evaluated by Dr. Mitzi Hansen and she was treated with [facility] in a single session on 03/10/2023.  --Since  Alimta could not be used because she is on hemodialysis, will plan for carboplatin, paclitaxel, pembrolizumab for 4 cycles followed by maintenance treatments with pembrolizumab alone every 3 weeks. We will dose reduce carboplatin to AUC 4 and paclitaxel to 150 mg/m, given dialysis status.  --Started cycle 1 of carboplatin and paclitaxel on 02/17/2023. She received Keytruda first dose on 02/12/2023.  PLAN: --Due for Cycle 4, Day 1 this Friday --Labs from today were reviewed. WBC 6.9, Hgb 7.0, MCV 93.6, Plt 68K --Due to worsening anemia and thrombocytopenia, we will defer treatment to next week.  --Strict precautions given for bleeding --RTC in one week with labs and follow up before Cycle 4, Day 1.   #Hypotension: --BP 92/48. Manual recheck 90/58.  --Patient is declining IV fluids as she is c/o fluid overload. She is due for dialysis tomorrow.  --Monitoring closely and plan to recheck once blood transfusion is complete   #Pancreatic lesion: --Heterogeneous pancreatic cystic lesion was not FDG avid on recent PET scan.  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  ---Case reviewed by Dr. Meridee Score who recommended workup at a tertiary GI center.  She had biopsy of this on 02/10/2023 at [facility].  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  However immunostains were not obtained.  Previously we requested immunostains to be submitted to determine whether or if it is pancreatic primary versus metastatic disease from lung.  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  Further plan of care to be determined depending on these results. --Her CA 19-9 was undetectable but CEA was elevated at 230 on 12/30/2022.   --It was brought to our attention that immunostains on lung specimen at [facility] showed no reactivity to TTF-1 and there is some discussion whether lung nodules are metastatic from pancreatic  primary.   PLAN --Awaiting pathology review from specimen obtained at [facility]  --Depending on final consensus, if treatment plan needs  to be adjusted, we will do so.    #Anemia 2/2 CKD: --Possibly related to kidney disease. Her mircera is on hold due to the malignancy.  --Since she is on chemotherapy, goal is to maintain hemoglobin closer to 8.  No indication for transfusion currently. PLAN: --Hgb is 7.0 today --Will receive 1 unit of PRBC today  I have spent a total of 30 minutes minutes of face-to-face and non-face-to-face time, preparing to see the patient, performing a medically appropriate examination, counseling and educating the patient, ordering medications/tests/procedures, documenting clinical information in the electronic health record, independently interpreting results and communicating results to the patient, and care coordination.   Georga Kaufmann PA-C Dept of Hematology and Oncology [facility] Phone: [phone]   No orders of the defined types were placed in this encounter.    Future Appointments  Date Time Provider Department Center  03/31/2023 10:00 AM Leslye Peer, MD [facility]-PULCARE None  04/02/2023  7:30 AM [facility] INFUSION [facility] None  04/05/2023  2:00 PM [facility] FLUSH [facility] None  04/07/2023 10:40 AM Ivonne Andrew, NP SCC-SCC None  04/12/2023  9:30 AM [facility]-POST TREATMENT [facility]RAD[facility] None  04/12/2023 12:00 PM Rachel Moulds, LCSW [facility]MED[facility]C None  04/28/2023 11:00 AM Fuller Plan, MD CR-GSO None  06/09/2023 11:00 AM [facility]-MR 1 [facility]-MRI [facility]  06/14/2023  7:00 AM [facility]-TUMOR BOARD CONFERENCE [facility]MED[facility] None  06/14/2023  2:00 PM Ronny Bacon, PA-C [facility]RADONC None  07/01/2023  1:50 PM SCC-ANNUAL WELLNESS VISIT SCC-SCC None  07/21/2023  9:30 AM Eustace Pen, PA-C MC-AFIBC None      This document was completed utilizing speech recognition software. Grammatical errors, random word  insertions, pronoun errors, and incomplete sentences are an occasional consequence of this system due to software limitations, ambient noise, and hardware issues. Any formal questions or concerns about the content, text or information contained within the body of this dictation should be directly addressed to the provider for clarification.

## 2023-03-31 NOTE — Telephone Encounter (Signed)
 Pt wonders if she needs a FU appt w/Dr. Delton Coombes. States she has cancer now. I adv yes but told her a nurse will give her a call to advise. Her 3 is [phone]

## 2023-03-31 NOTE — Patient Instructions (Signed)
 Blood Transfusion, Adult A blood transfusion is a procedure in which you receive blood or a type of blood cell (blood component) through an IV. You may need a blood transfusion when you have a low blood count, which is a low number of any blood cell. This may result from a bleeding disorder, illness, injury, or surgery. The blood may come from a donor, or you may be able to have your own blood collected and stored (autologous blood donation) before a planned surgery. The blood given in a transfusion may be made up of different blood components. You may receive: Red blood cells. These carry oxygen to the cells in the body. Platelets. These help your blood to clot. Plasma. This is the liquid part of your blood. It carries proteins and other substances throughout the body. White blood cells. These help you fight infections. If you have hemophilia or another clotting disorder, you may also receive other types of blood products. Depending on the type of blood product, this procedure may take 1-4 hours to complete. Tell a health care provider about: Any bleeding problems you have. Any previous reactions you have had during a blood transfusion. Any allergies you have. All medicines you are taking, including vitamins, herbs, eye drops, creams, and over-the-counter medicines. Any surgeries you have had. Any medical conditions you have. Whether you are pregnant or may be pregnant. What are the risks? Talk with your health care provider about risks. The most common problems include: A mild allergic reaction, such as red, swollen areas of skin (hives) and itching. Fever or chills. This may be the body's response to new blood cells received. This may occur during or up to 4 hours after the transfusion. More serious problems may include: A serious allergic reaction that causes difficulty breathing or swelling around the face and lips. Transfusion-associated circulatory overload (TACO), or too much fluid in  the lungs. This may cause breathing problems. Transfusion-related acute lung injury (TRALI), which causes breathing difficulty and low oxygen in the blood. This can occur within hours of the transfusion or several days later. Iron overload. This can happen after receiving many blood transfusions over a period of time. Infection or virus being transmitted. This is rare because donated blood is carefully tested before it is given. Hemolytic transfusion reaction. This is rare. It happens when the body's defense system (immune system)tries to attack the new blood cells. Symptoms may include fever, chills, nausea, low blood pressure, and low back or chest pain. Transfusion-associated graft-versus-host disease (TAGVHD). This is rare. It happens when donated cells attack the body's healthy tissues. What happens before the procedure? You will have a blood test to check your blood type. This test is done to know what kind of blood your body will accept and to match it to the donor blood. If you are going to have a planned surgery, you may be able to do an autologous blood donation. This may be done in case you need to have a transfusion. You will have your temperature, blood pressure, and pulse checked before the transfusion. If you have had an allergic reaction to a transfusion in the past, you may be given medicine to help prevent a reaction. This medicine may be given to you by mouth (orally) or through an IV. What happens during the procedure?  An IV will be inserted into one of your veins. The bag of blood will be attached to your IV. The blood will then enter through your vein. Your temperature, blood pressure,  and pulse will be monitored during the transfusion. This monitoring is done to detect early signs of a transfusion reaction. Tell your nurse right away if you have any of these symptoms during the transfusion: Shortness of breath or trouble breathing. Chest or back pain. Fever or  chills. Itching or hives. If you have any signs or symptoms of a reaction, your transfusion will be stopped and you may be given medicine. When the transfusion is complete, your IV will be removed. Pressure may be applied to the IV site for a few minutes. A bandage (dressing)will be applied. The procedure may vary among health care providers and hospitals. What happens after the procedure? Your temperature, blood pressure, pulse, breathing rate, and blood oxygen level will be monitored until you leave the hospital or clinic. Your blood may be tested to see how you have responded to the transfusion. You may be warmed with fluids or blankets to maintain a normal body temperature. If you receive your blood transfusion in an outpatient setting, you will be told whom to contact to report any reactions. Where to find more information Visit the [employer]: redcross.org Summary A blood transfusion is a procedure in which you receive blood or a type of blood cell (blood component) through an IV. The blood given in a transfusion may be made up of different blood components. You may receive red blood cells, platelets, plasma, or white blood cells depending on the condition treated. Your temperature, blood pressure, and pulse will be monitored before, during, and after the transfusion. After the transfusion, your blood may be tested to see how your body has responded. This information is not intended to replace advice given to you by your health care provider. Make sure you discuss any questions you have with your health care provider. Document Revised: 04/18/2021 Document Reviewed: 04/18/2021 Elsevier Patient Education  2024 [employer].

## 2023-03-31 NOTE — Telephone Encounter (Signed)
 Per LOV 12/2022: Follow-up 3 months with DR. Byrum or sooner if needed   Please contact patient to schedule. Thank you!

## 2023-04-01 LAB — TYPE AND SCREEN
ABO/RH(D): B POS
Antibody Screen: NEGATIVE
Unit division: 0

## 2023-04-01 LAB — BPAM RBC
Blood Product Expiration Date: 202503162359
ISSUE DATE / TIME: 202502261106
Unit Type and Rh: 5100

## 2023-04-01 NOTE — Telephone Encounter (Signed)
 Patient is returning missed call. She wants to know the purpose of the follow up since she has cancer and doesn't live close by. She doesn't want to make the appointment if its not necessary. Please call and advise.

## 2023-04-01 NOTE — Telephone Encounter (Signed)
 I called and spoke with pt. Pt states she was unsure why she was needing to f/u with Dr Delton Coombes. I informed pt that Dr Delton Coombes see's her for her lung cancer and he wanted to do f/u appointments so we can make sure she is getting the care she needs. Pt verbalized understanding and states she will go ahead and continue with the f/u appointments but she can only do Monday, Wednesday, or Fridays. I told pt I would have someone call her and get her scheduled with Dr Delton Coombes. Pt verbalized understanding. NFN

## 2023-04-02 ENCOUNTER — Ambulatory Visit: Payer: Medicare Other

## 2023-04-02 NOTE — Telephone Encounter (Signed)
 NFN

## 2023-04-05 ENCOUNTER — Ambulatory Visit: Payer: Medicare Other

## 2023-04-07 ENCOUNTER — Ambulatory Visit: Payer: Self-pay | Admitting: Nurse Practitioner

## 2023-04-08 ENCOUNTER — Other Ambulatory Visit: Payer: Self-pay

## 2023-04-08 MED FILL — Fosaprepitant Dimeglumine For IV Infusion 150 MG (Base Eq): INTRAVENOUS | Qty: 5 | Status: AC

## 2023-04-09 ENCOUNTER — Inpatient Hospital Stay: Payer: Medicare Other | Admitting: Oncology

## 2023-04-09 ENCOUNTER — Encounter: Payer: Self-pay | Admitting: Oncology

## 2023-04-09 ENCOUNTER — Inpatient Hospital Stay: Payer: Medicare Other | Attending: Oncology

## 2023-04-09 ENCOUNTER — Other Ambulatory Visit: Payer: Self-pay

## 2023-04-09 ENCOUNTER — Inpatient Hospital Stay: Payer: Medicare Other

## 2023-04-09 VITALS — BP 89/59 | HR 59 | Temp 97.4°F | Resp 16 | Wt 106.5 lb

## 2023-04-09 DIAGNOSIS — I959 Hypotension, unspecified: Secondary | ICD-10-CM | POA: Diagnosis not present

## 2023-04-09 DIAGNOSIS — D638 Anemia in other chronic diseases classified elsewhere: Secondary | ICD-10-CM

## 2023-04-09 DIAGNOSIS — C3412 Malignant neoplasm of upper lobe, left bronchus or lung: Secondary | ICD-10-CM | POA: Insufficient documentation

## 2023-04-09 DIAGNOSIS — D631 Anemia in chronic kidney disease: Secondary | ICD-10-CM

## 2023-04-09 DIAGNOSIS — K869 Disease of pancreas, unspecified: Secondary | ICD-10-CM

## 2023-04-09 DIAGNOSIS — R0602 Shortness of breath: Secondary | ICD-10-CM | POA: Diagnosis not present

## 2023-04-09 DIAGNOSIS — Z452 Encounter for adjustment and management of vascular access device: Secondary | ICD-10-CM

## 2023-04-09 DIAGNOSIS — Z5189 Encounter for other specified aftercare: Secondary | ICD-10-CM | POA: Insufficient documentation

## 2023-04-09 DIAGNOSIS — C7931 Secondary malignant neoplasm of brain: Secondary | ICD-10-CM | POA: Insufficient documentation

## 2023-04-09 DIAGNOSIS — J90 Pleural effusion, not elsewhere classified: Secondary | ICD-10-CM | POA: Diagnosis not present

## 2023-04-09 LAB — CBC WITH DIFFERENTIAL (CANCER CENTER ONLY)
Abs Immature Granulocytes: 0.07 10*3/uL (ref 0.00–0.07)
Basophils Absolute: 0 10*3/uL (ref 0.0–0.1)
Basophils Relative: 0 %
Eosinophils Absolute: 0.1 10*3/uL (ref 0.0–0.5)
Eosinophils Relative: 1 %
HCT: 25.9 % — ABNORMAL LOW (ref 36.0–46.0)
Hemoglobin: 8.2 g/dL — ABNORMAL LOW (ref 12.0–15.0)
Immature Granulocytes: 1 %
Lymphocytes Relative: 22 %
Lymphs Abs: 1.2 10*3/uL (ref 0.7–4.0)
MCH: 30.4 pg (ref 26.0–34.0)
MCHC: 31.7 g/dL (ref 30.0–36.0)
MCV: 95.9 fL (ref 80.0–100.0)
Monocytes Absolute: 0.6 10*3/uL (ref 0.1–1.0)
Monocytes Relative: 11 %
Neutro Abs: 3.6 10*3/uL (ref 1.7–7.7)
Neutrophils Relative %: 65 %
Platelet Count: 128 10*3/uL — ABNORMAL LOW (ref 150–400)
RBC: 2.7 MIL/uL — ABNORMAL LOW (ref 3.87–5.11)
RDW: 16.4 % — ABNORMAL HIGH (ref 11.5–15.5)
WBC Count: 5.5 10*3/uL (ref 4.0–10.5)
nRBC: 0 % (ref 0.0–0.2)

## 2023-04-09 LAB — CMP (CANCER CENTER ONLY)
ALT: 54 U/L — ABNORMAL HIGH (ref 0–44)
AST: 69 U/L — ABNORMAL HIGH (ref 15–41)
Albumin: 3.6 g/dL (ref 3.5–5.0)
Alkaline Phosphatase: 391 U/L — ABNORMAL HIGH (ref 38–126)
Anion gap: 10 (ref 5–15)
BUN: 16 mg/dL (ref 6–20)
CO2: 34 mmol/L — ABNORMAL HIGH (ref 22–32)
Calcium: 8.9 mg/dL (ref 8.9–10.3)
Chloride: 92 mmol/L — ABNORMAL LOW (ref 98–111)
Creatinine: 3.18 mg/dL — ABNORMAL HIGH (ref 0.44–1.00)
GFR, Estimated: 17 mL/min — ABNORMAL LOW (ref >60)
Glucose, Bld: 104 mg/dL — ABNORMAL HIGH (ref 70–99)
Potassium: 3.5 mmol/L (ref 3.5–5.1)
Sodium: 136 mmol/L (ref 135–145)
Total Bilirubin: 0.7 mg/dL (ref 0.0–1.2)
Total Protein: 6.1 g/dL — ABNORMAL LOW (ref 6.5–8.1)

## 2023-04-09 LAB — SAMPLE TO BLOOD BANK

## 2023-04-09 MED ORDER — SODIUM CHLORIDE 0.9% FLUSH
10.0000 mL | Freq: Once | INTRAVENOUS | Status: AC
Start: 2023-04-09 — End: 2023-04-09
  Administered 2023-04-09: 10 mL

## 2023-04-09 MED ORDER — SODIUM CHLORIDE 0.9 % IV SOLN
150.0000 mg/m2 | Freq: Once | INTRAVENOUS | Status: AC
  Administered 2023-04-09: 216 mg via INTRAVENOUS
  Filled 2023-04-09: qty 36

## 2023-04-09 MED ORDER — HEPARIN SOD (PORK) LOCK FLUSH 100 UNIT/ML IV SOLN
250.0000 [IU] | Freq: Once | INTRAVENOUS | Status: AC | PRN
  Administered 2023-04-09: 250 [IU]

## 2023-04-09 MED ORDER — SODIUM CHLORIDE 0.9 % IV SOLN
161.6000 mg | Freq: Once | INTRAVENOUS | Status: AC
  Administered 2023-04-09: 160 mg via INTRAVENOUS
  Filled 2023-04-09: qty 16

## 2023-04-09 MED ORDER — PALONOSETRON HCL INJECTION 0.25 MG/5ML
0.2500 mg | Freq: Once | INTRAVENOUS | Status: AC
  Administered 2023-04-09: 0.25 mg via INTRAVENOUS
  Filled 2023-04-09: qty 5

## 2023-04-09 MED ORDER — DEXAMETHASONE SODIUM PHOSPHATE 10 MG/ML IJ SOLN
10.0000 mg | Freq: Once | INTRAMUSCULAR | Status: AC
  Administered 2023-04-09: 10 mg via INTRAVENOUS
  Filled 2023-04-09: qty 1

## 2023-04-09 MED ORDER — SODIUM CHLORIDE 0.9% FLUSH
3.0000 mL | INTRAVENOUS | Status: DC | PRN
  Administered 2023-04-09: 3 mL

## 2023-04-09 MED ORDER — DIPHENHYDRAMINE HCL 50 MG/ML IJ SOLN
25.0000 mg | Freq: Once | INTRAMUSCULAR | Status: AC
  Administered 2023-04-09: 25 mg via INTRAVENOUS
  Filled 2023-04-09: qty 1

## 2023-04-09 MED ORDER — SODIUM CHLORIDE 0.9 % IV SOLN
150.0000 mg | Freq: Once | INTRAVENOUS | Status: AC
  Administered 2023-04-09: 150 mg via INTRAVENOUS
  Filled 2023-04-09: qty 150

## 2023-04-09 MED ORDER — FAMOTIDINE IN NACL 20-0.9 MG/50ML-% IV SOLN
20.0000 mg | Freq: Once | INTRAVENOUS | Status: AC
  Administered 2023-04-09: 20 mg via INTRAVENOUS
  Filled 2023-04-09: qty 50

## 2023-04-09 MED ORDER — SODIUM CHLORIDE 0.9 % IV SOLN
200.0000 mg | Freq: Once | INTRAVENOUS | Status: AC
  Administered 2023-04-09: 200 mg via INTRAVENOUS
  Filled 2023-04-09: qty 200

## 2023-04-09 MED ORDER — SODIUM CHLORIDE 0.9 % IV SOLN: INTRAVENOUS | Status: DC

## 2023-04-09 NOTE — Assessment & Plan Note (Signed)
 Experiencing hypotension with systolic readings in the seventies, affecting chemotherapy and dialysis. Dialysis session required cessation of fluid removal and administration of 200 mL of fluid. Midodrine is being used to manage blood pressure, with additional fluids considered if hypotension persists. - Administer Midodrine, 2 tablets, to increase blood pressure.  Blood pressure improved with midodrine today.

## 2023-04-09 NOTE — Assessment & Plan Note (Addendum)
 -Please review oncology history for additional details and timeline of events.  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  -Her case was discussed in tumor conference on 01/07/2023.  Consensus opinion is that these lung nodules are malignancy related.  Hence clinically it appears to be stage IVa disease.  All treatment options are palliative in nature and not curative in intent and this was explained to patient and her fianc and they verbalized understanding.  -Previously we submitted request for [facility] mutation testing on the specimen.  It showed no actionable mutations.  -Plan for chemotherapy with immunotherapy or immunotherapy alone.  Since Alimta could not be used because she is on hemodialysis, will plan for carboplatin, paclitaxel, pembrolizumab for 4 cycles followed by maintenance treatments with pembrolizumab alone every 3 weeks.  We will dose reduce carboplatin to AUC 4 and paclitaxel to 150 mg/m, given dialysis status.  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  She could not even receive PICC line placement and ended up getting midline placed in the left arm.  IR suggested that we should check with nephrology to see if we can use her dialysis catheter for chemotherapy.  Dr. Malen Gauze, her nephrologist approved use of dialysis catheter for chemo, in case it is needed.  However she does not have any other access left and if femoral dialysis catheter were to fail, then she has no other option for dialysis other than possibly a transhepatic catheter and if that fails, then palliative care and death.  -We were able to get peripheral IV inserted using the help of for IV team and proceed with cycle 1 of carboplatin and paclitaxel on 02/17/2023.  She received Keytruda first dose on 02/12/2023.  -Pancreatic lesion biopsy at [facility] on  02/10/2023 came back positive for adenocarcinoma.  Unsure if it is primary pancreatic or metastatic from lung.  Immunostains were not obtained and we have submitted request for this.  MRI of the brain from 01/29/2023 showed 1.2 cm enhancing mass in the left insula, consistent with metastatic disease.  Started her on Decadron 4 mg p.o. twice daily and referral sent to radiation oncology for further evaluation and management.  She was evaluated by Dr. Mitzi Hansen and she was treated with [facility] in a single session on 03/10/2023.  Recent CT of the chest incidentally also showed liver lesions which are new and concerning for metastatic disease.    On 02/22/2023, CT abdomen and pelvis showed similar findings but indeterminate.  Metastatic disease could not be ruled out.  Following results of pancreatic biopsy immunostains, we will consider liver biopsy to determine etiology of liver lesions-lung versus pancreatic primary.  Labs today reveal no dose-limiting toxicities. Cycle 4 due today.  Will proceed with cycle 4 of carbo, Taxol and Keytruda today using peripheral IV.  Request submitted for restaging PET scan to be done approximately 10 days from now.  Will discuss results on return visit and consider transition to single agent Keytruda.  It was brought to our attention that immunostains on lung specimen at [facility] showed no reactivity to TTF-1 and there is some discussion whether lung nodules are metastatic from pancreatic primary.  I discussed her case with our pathologist Dr. Jimmy Picket previously.  They are in the process of obtaining slides from [facility] and additional information to follow.  Depending on final  consensus, if treatment plan needs to be adjusted, we will do so.  This was discussed with patient and her husband previously.  They verbalized understanding.

## 2023-04-09 NOTE — Assessment & Plan Note (Signed)
 Possibly related to kidney disease. Her mircera is on hold due to the malignancy.   Since she is on chemotherapy, goal is to maintain hemoglobin closer to 8.  No indication for transfusion currently.

## 2023-04-09 NOTE — Progress Notes (Signed)
 [facility]  ONCOLOGY CLINIC PROGRESS NOTE   Patient Care Team: Ivonne Andrew, NP as PCP - General (Pulmonary Disease) O'Neal, Ronnald Ramp, MD as PCP - Cardiology (Cardiology) Bensimhon, Bevelyn Buckles, MD as PCP - Advanced Heart Failure (Cardiology) Lytle Butte, RN as Oncology Nurse Navigator  PATIENT NAME: Tina Watkins   MR#: [id number] DOB: Tina Watkins  Date of visit: 04/09/2023   ASSESSMENT & PLAN:   Tina Watkins is a 58 y.o. lady with multiple medical comorbidities including kidney transplant, currently ESRD on HD, congestive heart failure, atrial fibrillation/flutter, COPD, depression, chronic gout, chronic pain, anemia of chronic disease, gastroparesis, midodrine dependent hypotension, history of strokes, suspected mesenteric ischemia, was recently diagnosed with malignant lung nodule on 11/16/2022 via bronchoscopy.  Immunostains suggestive of adenocarcinoma.   Lung cancer, upper lobe (HCC) -Please review oncology history for additional details and timeline of events.  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  -Her case was discussed in tumor conference on 01/07/2023.  Consensus opinion is that these lung nodules are malignancy related.  Hence clinically it appears to be stage IVa disease.  All treatment options are palliative in nature and not curative in intent and this was explained to patient and her fianc and they verbalized understanding.  -Previously we submitted request for Foundation One mutation testing on the specimen.  It showed no actionable mutations.  -Plan for chemotherapy with immunotherapy or immunotherapy alone.  Since Alimta could not be used because she is on hemodialysis, will plan for carboplatin, paclitaxel, pembrolizumab for 4 cycles followed by maintenance treatments with pembrolizumab alone every  3 weeks.  We will dose reduce carboplatin to AUC 4 and paclitaxel to 150 mg/m, given dialysis status.  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  She could not even receive PICC line placement and ended up getting midline placed in the left arm.  IR suggested that we should check with nephrology to see if we can use her dialysis catheter for chemotherapy.  Dr. Malen Gauze, her nephrologist approved use of dialysis catheter for chemo, in case it is needed.  However she does not have any other access left and if femoral dialysis catheter were to fail, then she has no other option for dialysis other than possibly a transhepatic catheter and if that fails, then palliative care and death.  -We were able to get peripheral IV inserted using the help of for IV team and proceed with cycle 1 of carboplatin and paclitaxel on 02/17/2023.  She received Keytruda first dose on 02/12/2023.  -Pancreatic lesion biopsy at [facility] on 02/10/2023 came back positive for adenocarcinoma.  Unsure if it is primary pancreatic or metastatic from lung.  Immunostains were not obtained and we have submitted request for this.  MRI of the brain from 01/29/2023 showed 1.2 cm enhancing mass in the left insula, consistent with metastatic disease.  Started her on Decadron 4 mg p.o. twice daily and referral sent to radiation oncology for further evaluation and management.  She was evaluated by Dr. Mitzi Hansen and she was treated with [facility] in a single session on 03/10/2023.  Recent CT of the chest incidentally also showed liver lesions which are new and concerning for metastatic disease.    On 02/22/2023, CT abdomen and pelvis showed similar findings but indeterminate.  Metastatic disease could not be ruled out.  Following  results of pancreatic biopsy immunostains, we will consider liver biopsy to determine etiology of liver lesions-lung versus pancreatic primary.  Labs today reveal no dose-limiting toxicities. Cycle 4 due  today.  Will proceed with cycle 4 of carbo, Taxol and Keytruda today using peripheral IV.  Request submitted for restaging PET scan to be done approximately 10 days from now.  Will discuss results on return visit and consider transition to single agent Keytruda.  It was brought to our attention that immunostains on lung specimen at [facility] showed no reactivity to TTF-1 and there is some discussion whether lung nodules are metastatic from pancreatic primary.  I discussed her case with our pathologist Dr. Jimmy Picket previously.  They are in the process of obtaining slides from [facility] and additional information to follow.  Depending on final consensus, if treatment plan needs to be adjusted, we will do so.  This was discussed with patient and her husband previously.  They verbalized understanding.   Lesion of pancreas Heterogeneous pancreatic cystic lesion was not FDG avid on recent PET scan.  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   -Case reviewed by Dr. Meridee Score who recommended workup at a tertiary GI center.  She had biopsy of this on 02/10/2023 at [facility].  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  However immunostains were not obtained.  Previously we requested immunostains to be submitted to determine whether or if it is pancreatic primary versus metastatic disease from lung.  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  Further plan of care to be determined depending on these results.  -Her CA 19-9 was undetectable but CEA was elevated at 230 on 12/30/2022.  This will be monitored on future visits.  It was brought to our attention that immunostains on lung specimen at [facility] showed no reactivity to TTF-1 and there is some discussion whether lung nodules are metastatic from pancreatic primary.  I discussed her case with our pathologist  Dr. Jimmy Picket previously.  They are in the process of obtaining slides from [facility] and additional information to follow.  Depending on final consensus, if treatment plan needs to be adjusted, we will do so.  This was discussed with patient and her husband previously.  They verbalized understanding  Hypotension Experiencing hypotension with systolic readings in the seventies, affecting chemotherapy and dialysis. Dialysis session required cessation of fluid removal and administration of 200 mL of fluid. Midodrine is being used to manage blood pressure, with additional fluids considered if hypotension persists. - Administer Midodrine, 2 tablets, to increase blood pressure.  Blood pressure improved with midodrine today.  Anemia of chronic disease Possibly related to kidney disease. Her mircera is on hold due to the malignancy.   Since she is on chemotherapy, goal is to maintain hemoglobin closer to 8.  No indication for transfusion currently.     I reviewed lab results and outside records for this visit and discussed relevant results with the patient. Diagnosis, plan of care and treatment options were also discussed in detail with the patient. Opportunity provided to ask questions and answers provided to her apparent satisfaction. Provided instructions to call our clinic with any problems, questions or concerns prior to return visit. I recommended to continue follow-up with PCP and sub-specialists. She verbalized understanding and agreed with the plan.   NCCN guidelines have been consulted in the planning of this patient's care.  I spent a total of 30 minutes during this encounter with the patient including review of chart and various tests results, discussions about plan of care and coordination of care plan.   Meryl Crutch, MD   [facility] - A DEPT [facility] [address] [place]  [address] Dept: [phone] Dept Fax: [phone]    CHIEF COMPLAINT/ REASON FOR VISIT:   Lung adenocarcinoma with multiple bilateral lung nodules, 1 intracranial metastasis.  Stage IV disease.  Also has adenocarcinoma in the pancreas, primary versus metastatic from lung.   Current Treatment: Plan made for palliative systemic chemoimmunotherapy with carboplatin, paclitaxel and pembrolizumab.  Pemetrexed could not be used because of hemodialysis status.  Started treatments from 02/12/2023 using peripheral IV.  She cannot get PICC line or Port-A-Cath placement because of bad vasculopathy.  INTERVAL HISTORY:    Discussed the use of AI scribe software for clinical note transcription with the patient, who gave verbal consent to proceed.   Tina Watkins is here today for repeat clinical assessment.   On arrival to clinic, the patient's blood pressure was significantly lower than usual, with the systolic pressure in the seventies, compared to her usual nineties to hundreds. The patient denies any new symptoms such as nausea, vomiting, diarrhea, fevers, chills, or night sweats. She reports that her blood pressure has been low for years, but not this low. The patient also reports that her dialysis session the previous day was complicated by low blood pressure, requiring the session to be modified to only clean the blood and not remove fluids.   I have reviewed the past medical history, past surgical history, social history and family history with the patient and they are unchanged from previous note.  HISTORY OF PRESENT ILLNESS:   ONCOLOGY HISTORY:    Patient was seen by pulmonology after she was diagnosed with COVID-19 infection in the ED on 09/14/2022.  Because of her chronic smoking history with at least 51-pack-year smoking, a low-dose CT of the chest was obtained for lung cancer screening.  On 09/28/2022, CT chest showed dominant new solid peripheral left upper lobe lung nodule measuring 21  mm.  Several new clustered solid pulmonary nodules throughout left upper lobe, largest measuring 5 mm.  Numerous additional tiny 2 to 3 mm solid pulmonary nodules scattered in both lungs, new finding.  Findings concerning for primary bronchogenic malignancy.  Lung RADS 4B, suspicious.  Also noted was cystic pancreatic tail lesions, largest measuring 2.6 cm posteriorly, indeterminate.  MRI abdomen recommended for further evaluation.   Because of CT chest findings, PET/CT was obtained on 10/19/2022 and it showed 2.6 cm subpleural nodule in the left upper lobe with SUV max of 4.9.  Primary bronchogenic neoplasm could not be excluded.  Tissue sampling was recommended.  Also noted was 15 mm short axis prevascular lymph node with SUV of 2.4, new compared to 2023.  Indeterminate.  Small left pleural effusion was also noted.   Several subcentimeter lung nodules were noted on CT scan . They are below the threshold by size, for determining FDG activity and hence were not apparent on PET scan.    MRI of the abdomen on 11/11/2022: Evaluation of the pancreatic tail was limited by metallic susceptibility artifact from adjacent surgical clips as well as iron burden in the adjacent stomach parenchyma.  There was a complex, multiseptated, internally heterogeneous cystic lesion arising from the tip of the pancreatic tail  measuring 4.4 x 2.3 x 3.2 cm.  Contrast enhancement could not be confirmed due to technical limitations.  Appearance was concerning for a partially cystic malignancy with a significant solid component.  Tissue sampling was recommended.  For future, recommended multiphasic contrast-enhanced CT.  Severe hepatic and splenic iron deposition was noted.  Coarse, nodular contour of the liver, consistent with cirrhosis.  Status post bilateral native nephrectomy.  Right lower quadrant renal transplant graft was noted.  Diffusely heterogeneous bone marrow, reflecting marrow reconversion renal osteodystrophy.   On  11/11/2022, she had multidetector CT imaging of the chest using thin slice collimation for electromagnetic bronchoscopy planning purposes.  It showed similar findings compared to prior imaging.   On 11/16/2022, Dr. Delton Coombes performed robotic assisted navigational bronchoscopy and biopsies.  Left upper lobe lung nodule needle aspiration biopsy showed presence of malignant cells.  Immunostains were obtained at a later date and showed positivity for TTF-1 and negative for p40 and cytokeratin 5/6, consistent with lung adenocarcinoma.  Endobronchial brushings and brushings from left upper lobe lung nodule showed no malignant cells.   She was admitted to the hospital on 12/18/2022 after she presented with shortness of breath, cough with yellow sputum, chills, myalgias and diarrhea.  She was noted to be in acute hypoxic respiratory failure, related to postobstructive pneumonia.  CT angiogram of the chest on 12/18/2022 showed no evidence of pulmonary embolism.  It did show numerous small nodules throughout right lung which appeared to be increased in number compared to prior study.  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  Stable mediastinal lymphadenopathy.   Underwent left-sided thoracentesis on 12/20/2022.  Pleural fluid cytology showed no evidence of malignant cells.   NGS testing showed no actionable mutations.  Plan made for palliative chemoimmunotherapy with carboplatin, paclitaxel and pembrolizumab.    Port-A-Cath could not be placed because of bad vasculopathy.  She could not even receive PICC line placement and ended up getting midline placed in the left arm.  Presented to clinic on 02/12/2023 for initiation of treatments.  Her hemoglobin was 7 and she had to be given 1 unit of PRBC transfusion.  We proceeded with pembrolizumab that day.    Started carboplatin and paclitaxel at  a later date on 02/17/2023.  Chemotherapy is being given through peripheral IV using help of vascular access team.  -Pancreatic lesion biopsy at [facility] on 02/10/2023 came back positive for adenocarcinoma.  Unsure if it is primary pancreatic or metastatic from lung.  Immunostains were not obtained and we have submitted request for this.  MRI of the brain from 01/29/2023 showed 1.2 cm enhancing mass in the left insula, consistent with metastatic disease.  Started her on Decadron 4 mg p.o. twice daily and referral sent to radiation oncology for further evaluation and management.  She was evaluated by Dr. Mitzi Hansen.  Plan for SRS in a single session on 03/10/2023.  Recent CT of the chest incidentally also showed liver lesions which are new and concerning for metastatic disease.    On 02/22/2023, CT abdomen and pelvis showed similar findings but indeterminate.  Metastatic disease could not be ruled out.  Following results of pancreatic biopsy immunostains, we will consider liver biopsy to determine etiology of liver lesions-lung versus pancreatic primary.   Oncology History  Lung cancer, upper lobe (HCC)  10/28/2022 Initial Diagnosis   Lung cancer, upper lobe (HCC)   01/11/2023  Cancer Staging   Staging form: Lung, AJCC 8th Edition - Clinical: Stage IVA (cTX, cNX, cM1a) - Signed by Meryl Crutch, MD on 01/11/2023   02/12/2023 -  Chemotherapy   Patient is on Treatment Plan : LUNG NSCLC Carboplatin (6) + Paclitaxel (200) + Pembrolizumab (200) D1 q21d x 4 cycles / Pembrolizumab (200) Maintenance D1 q21d         REVIEW OF SYSTEMS:   Review of Systems - Oncology  All other pertinent systems were reviewed with the patient and are negative.  ALLERGIES: She is allergic to levofloxacin and tape.  MEDICATIONS:  Current Outpatient Medications  Medication Sig Dispense Refill   allopurinol (ZYLOPRIM) 100 MG tablet Take 0.5 tablets (50 mg total) by mouth 2 (two) times a week. (Patient taking  differently: Take 100 mg by mouth daily in the afternoon.) 90 tablet 3   amiodarone (PACERONE) 200 MG tablet Take 0.5 tablets (100 mg total) by mouth daily. 90 tablet 1   apixaban (ELIQUIS) 2.5 MG TABS tablet Take 1 tablet (2.5 mg total) by mouth 2 (two) times daily. Okay to restart this medication on 11/18/2022     AURYXIA 1 GM 210 MG(Fe) tablet Take 420 mg by mouth 3 (three) times daily.     calcium-vitamin D (OSCAL WITH D) 500-5 MG-MCG tablet Take 1 tablet by mouth 2 (two) times daily. 90 tablet 1   cyclobenzaprine (FLEXERIL) 5 MG tablet Take 1 tablet (5 mg total) by mouth at bedtime as needed. 90 tablet 1   diphenhydrAMINE (BENADRYL) 25 mg capsule Take 1 capsule (25 mg total) by mouth every 8 (eight) hours as needed. (Patient taking differently: Take 25 mg by mouth every 8 (eight) hours as needed for allergies or sleep.) 30 capsule 6   dorzolamide-timolol (COSOPT) 2-0.5 % ophthalmic solution Place 1 drop into the left eye every evening.     famotidine (PEPCID) 20 MG tablet Take 1 tablet (20 mg total) by mouth 2 (two) times daily. 180 tablet 3   fluticasone-salmeterol (ADVAIR HFA) 115-21 MCG/ACT inhaler Inhale 2 puffs into the lungs 2 (two) times daily. 1 each 12   gabapentin (NEURONTIN) 100 MG capsule Take 100 mg by mouth 3 (three) times daily as needed (nerve pain).     Lactulose 20 GM/30ML SOLN Take 30 mLs (20 g total) by mouth 2 (two) times daily as needed (constipation). 450 mL 2   metoCLOPramide (REGLAN) 10 MG tablet Take 1 tablet (10 mg total) by mouth 3 (three) times daily before meals. (Patient taking differently: Take 10 mg by mouth daily in the afternoon.) 90 tablet 2   midodrine (PROAMATINE) 10 MG tablet TAKE 1.5 TABLETS (15 MG TOTAL) BY MOUTH 3 (THREE) TIMES DAILY. NEEDS FOLLOW UP APPOINTMENT FOR ANYMORE REFILLS 405 tablet 0   omeprazole (PRILOSEC) 40 MG capsule TAKE 1 CAPSULE (40 MG TOTAL) BY MOUTH DAILY. 90 capsule 1   oxyCODONE-acetaminophen (PERCOCET) 10-325 MG tablet Take 1 tablet  by mouth 4 (four) times daily as needed for pain.     sertraline (ZOLOFT) 25 MG tablet Take 1 tablet (25 mg total) by mouth daily. 30 tablet 3   VELPHORO 500 MG chewable tablet Chew 500 mg by mouth See admin instructions. Take one tablet by mouth three times a daily with meals and snakes per patient     Colchicine 0.6 MG CAPS Take 0.6 mg by mouth daily as needed (gout flare up). (Patient not taking: Reported on 04/09/2023) 30 capsule 2   dexamethasone (DECADRON) 4 MG tablet Take  2 tablets (8mg ) by mouth daily starting the day after carboplatin for 3 days. Take with food (Patient not taking: Reported on 04/09/2023) 30 tablet 1   ondansetron (ZOFRAN) 8 MG tablet Take 1 tablet (8 mg total) by mouth every 8 (eight) hours as needed for nausea or vomiting. Start on the third day after carboplatin. (Patient not taking: Reported on 04/09/2023) 30 tablet 1   zolpidem (AMBIEN) 10 MG tablet Take 1 tablet (10 mg total) by mouth at bedtime as needed for sleep. (Patient not taking: Reported on 04/09/2023) 10 tablet 0   No current facility-administered medications for this visit.   Facility-Administered Medications Ordered in Other Visits  Medication Dose Route Frequency Provider Last Rate Last Admin   0.9 %  sodium chloride infusion   Intravenous Continuous Brinleigh Tew, MD 10 mL/hr at 04/09/23 1136 New Bag at 04/09/23 1136   CARBOplatin (PARAPLATIN) 160 mg in sodium chloride 0.9 % 100 mL chemo infusion  160 mg Intravenous Once Braeden Dolinski, MD 232 mL/hr at 04/09/23 1636 160 mg at 04/09/23 1636   sodium chloride flush (NS) 0.9 % injection 3 mL  3 mL Intracatheter PRN Daneille Desilva, MD   3 mL at 04/09/23 1240     VITALS:   Blood pressure (!) 89/59, pulse (!) 59, temperature (!) 97.4 F (36.3 C), temperature source Temporal, resp. rate 16, weight 106 lb 8 oz (48.3 kg), SpO2 100%.  Wt Readings from Last 3 Encounters:  04/09/23 106 lb 8 oz (48.3 kg)  03/31/23 109 lb 6.4 oz (49.6 kg)  03/12/23 105 lb 3.2 oz (47.7  kg)    Body mass index is 21.51 kg/m.  Performance status (ECOG): 2 - Symptomatic, <50% confined to bed  PHYSICAL EXAM:   Physical Exam Constitutional:      General: She is not in acute distress. HENT:     Head: Normocephalic and atraumatic.  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     Heart sounds: Normal heart sounds.  Pulmonary:     Effort: Pulmonary effort is normal.     Comments: Dullness on left side up to infrascapular area Abdominal:     General: There is no distension.  Musculoskeletal:     Right lower leg: No edema.     Left lower leg: No edema.     Comments: Left arm midline in place  Neurological:     Mental Status: She is alert and oriented to person, place, and time.  Psychiatric:        Mood and Affect: Mood normal.        Behavior: Behavior normal.        Thought Content: Thought content normal.       LABORATORY DATA:   I have reviewed the data as listed.  Results for orders placed or performed in visit on 04/09/23  CMP (Cancer Center only)  Result Value Ref Range   Sodium 136 135 - 145 mmol/L   Potassium 3.5 3.5 - 5.1 mmol/L   Chloride 92 (L) 98 - 111 mmol/L   CO2 34 (H) 22 - 32 mmol/L   Glucose, Bld 104 (H) 70 - 99 mg/dL   BUN 16 6 - 20 mg/dL   Creatinine 4.69 (H) 6.29 - 1.00 mg/dL   Calcium 8.9 8.9 - 52.8 mg/dL   Total Protein 6.1 (L) 6.5 - 8.1 g/dL   Albumin 3.6 3.5 -  5.0 g/dL   AST 69 (H) 15 - 41 U/L   ALT 54 (H) 0 - 44 U/L   Alkaline Phosphatase 391 (H) 38 - 126 U/L   Total Bilirubin 0.7 0.0 - 1.2 mg/dL   GFR, Estimated 17 (L) >60 mL/min   Anion gap 10 5 - 15  CBC with Differential (Cancer Center Only)  Result Value Ref Range   WBC Count 5.5 4.0 - 10.5 K/uL   RBC 2.70 (L) 3.87 - 5.11 MIL/uL   Hemoglobin 8.2 (L) 12.0 - 15.0 g/dL   HCT 75.6 (L) 43.3 - 29.5 %   MCV 95.9 80.0 - 100.0 fL   MCH 30.4 26.0 - 34.0 pg   MCHC 31.7 30.0 - 36.0 g/dL   RDW 18.8 (H)  41.6 - 15.5 %   Platelet Count 128 (L) 150 - 400 K/uL   nRBC 0.0 0.0 - 0.2 %   Neutrophils Relative % 65 %   Neutro Abs 3.6 1.7 - 7.7 K/uL   Lymphocytes Relative 22 %   Lymphs Abs 1.2 0.7 - 4.0 K/uL   Monocytes Relative 11 %   Monocytes Absolute 0.6 0.1 - 1.0 K/uL   Eosinophils Relative 1 %   Eosinophils Absolute 0.1 0.0 - 0.5 K/uL   Basophils Relative 0 %   Basophils Absolute 0.0 0.0 - 0.1 K/uL   Immature Granulocytes 1 %   Abs Immature Granulocytes 0.07 0.00 - 0.07 K/uL  Sample to Blood Bank  Result Value Ref Range   Blood Bank Specimen SAMPLE AVAILABLE FOR TESTING    Sample Expiration      04/12/2023,2359 Performed at [facility], [address], [place], [place] [address]      RADIOGRAPHIC STUDIES:  I have personally reviewed the radiological images as listed and agree with the findings in the report.  IR Fluoro Guide CV Line Right Result Date: 03/29/2023 INDICATION: End-stage renal disease, poorly functioning chronic right femoral HD catheter EXAM: FLUOROSCOPIC EXCHANGE OF THE RIGHT FEMORAL HD CATHETER IVC CATHETERIZATION AND VENOGRAM MEDICATIONS: ANCEF 2 G; The antibiotic was administered within an appropriate time interval prior to skin puncture. ANESTHESIA/SEDATION: NONE. FLUOROSCOPY: Radiation Exposure Index (as provided by the fluoroscopic device): 99 mGy Kerma COMPLICATIONS: None immediate. PROCEDURE: Informed written consent was obtained from the patient after a thorough discussion of the procedural risks, benefits and alternatives. All questions were addressed. Maximal Sterile Barrier Technique was utilized including caps, mask, sterile gowns, sterile gloves, sterile drape, hand hygiene and skin antiseptic. A timeout was performed prior to the initiation of the procedure. Initial fluoroscopic survey demonstrates a right femoral HD catheter. Tip terminates at the lower IVC below the renal veins. Catheter removed over a Glidewire. A 12 French sheath was  advanced. IVC venogram performed. IVC is irregular and diffusely stenotic with azygous collateralization. Kumpe catheter and Glidewire advanced through the sheath. Access manipulated through the multifocal IVC irregularity and stenoses. Catheter and guidewire access advanced to successfully regain access into the right atrium. Contrast injection confirms position in the right atrium. Measurements obtained for the appropriate catheter length. Over the Amplatz and stiff Glidewire, a 36 cm palindrome catheter was advanced with some difficulty. Tip positioned eventually in the inferior right atrium. Contrast injection confirms position. Blood aspirated easily followed by saline and heparin flushes. Appropriate volume and strength of heparin instilled in all lumens. Catheter secured at the right femoral access site with a Prolene suture and a sterile dressing. No immediate complication. Patient tolerated the procedure well. IMPRESSION: Successful  exchange and reposition of the tunneled right femoral HD catheter. Tip now terminates in the inferior right atrium. Electronically Signed   By: Judie Petit.  Shick M.D.   On: 03/29/2023 12:09   IR Venocavagram Ivc Result Date: 03/29/2023 INDICATION: End-stage renal disease, poorly functioning chronic right femoral HD catheter EXAM: FLUOROSCOPIC EXCHANGE OF THE RIGHT FEMORAL HD CATHETER IVC CATHETERIZATION AND VENOGRAM MEDICATIONS: ANCEF 2 G; The antibiotic was administered within an appropriate time interval prior to skin puncture. ANESTHESIA/SEDATION: NONE. FLUOROSCOPY: Radiation Exposure Index (as provided by the fluoroscopic device): 99 mGy Kerma COMPLICATIONS: None immediate. PROCEDURE: Informed written consent was obtained from the patient after a thorough discussion of the procedural risks, benefits and alternatives. All questions were addressed. Maximal Sterile Barrier Technique was utilized including caps, mask, sterile gowns, sterile gloves, sterile drape, hand hygiene and  skin antiseptic. A timeout was performed prior to the initiation of the procedure. Initial fluoroscopic survey demonstrates a right femoral HD catheter. Tip terminates at the lower IVC below the renal veins. Catheter removed over a Glidewire. A 12 French sheath was advanced. IVC venogram performed. IVC is irregular and diffusely stenotic with azygous collateralization. Kumpe catheter and Glidewire advanced through the sheath. Access manipulated through the multifocal IVC irregularity and stenoses. Catheter and guidewire access advanced to successfully regain access into the right atrium. Contrast injection confirms position in the right atrium. Measurements obtained for the appropriate catheter length. Over the Amplatz and stiff Glidewire, a 36 cm palindrome catheter was advanced with some difficulty. Tip positioned eventually in the inferior right atrium. Contrast injection confirms position. Blood aspirated easily followed by saline and heparin flushes. Appropriate volume and strength of heparin instilled in all lumens. Catheter secured at the right femoral access site with a Prolene suture and a sterile dressing. No immediate complication. Patient tolerated the procedure well. IMPRESSION: Successful exchange and reposition of the tunneled right femoral HD catheter. Tip now terminates in the inferior right atrium. Electronically Signed   By: Judie Petit.  Shick M.D.   On: 03/29/2023 12:09   CT PELVIS WO CONTRAST Result Date: 03/20/2023 CLINICAL DATA:  coccyx/sacral TTP, midline Review of radiologic records indicates history of stage IV lung cancer. EXAM: CT PELVIS WITHOUT CONTRAST TECHNIQUE: Multidetector CT imaging of the pelvis was performed following the standard protocol without intravenous contrast. RADIATION DOSE REDUCTION: This exam was performed according to the departmental dose-optimization program which includes automated exposure control, adjustment of the mA and/or kV according to patient size and/or use of  iterative reconstruction technique. COMPARISON:  CT 02/27/2023 FINDINGS: Urinary Tract: Decompressed urinary bladder, diffuse bladder wall thickening. 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 Bowel: No inflammation of pelvic bowel loops. Moderate volume of colonic stool. Vascular/Lymphatic: Extensive arterial vascular calcifications. Right-sided dialysis catheter, extending beyond the field of view. No pelvic adenopathy. Reproductive: Soft tissue prominence in the region of the cervix. Otherwise unremarkable. Other:  No pelvic ascites.  Mild generalized body wall edema. 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 No evidence of fracture or focal bone lesion. No bony destructive change. No intramuscular collection in the absence of IV contrast. IMPRESSION: 1. No evidence of metastatic disease in the pelvis. No explanation for sacrococcygeal tenderness. 2. Soft tissue prominence in the region of the cervix, recommend correlation with physical exam. 3. 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 4. Right femoral head avascular necrosis. 5. Mild generalized body wall edema. Electronically Signed   By: Narda Rutherford M.D.   On: 03/20/2023 18:52   DG Chest Portable  1 View Result Date: 03/20/2023 CLINICAL DATA:  Dyspnea EXAM: PORTABLE CHEST 1 VIEW COMPARISON:  02/27/2023. FINDINGS: Left hemithorax is opacified. Pulmonary interstitial prominence with a nodular configuration of the right hemithorax. Metastatic disease not excluded. No pneumothorax. Stent overlies the right hilar region. Osseous structures appear intact. IMPRESSION: Nodular interstitial process on the right. Completely opacified left hemithorax. Electronically Signed   By: Layla Maw M.D.   On: 03/20/2023 18:27    CODE STATUS:  Code Status History     Date Active Date Inactive Code Status Order ID Comments User Context   12/18/2022 1552 12/24/2022 2039 Full Code [id number]  Modena Slater, DO ED   09/06/2021 1325 09/07/2021 2107 Full Code [id number]  Evlyn Kanner, MD ED   03/28/2020 1621 03/29/2020 2052 Full Code [id number]  Emeline General, MD ED   09/01/2019 1812 09/03/2019 2011 Full Code [id number]  Jerald Kief, MD ED   08/05/2019 1443 08/25/2019 0311 Full Code [id number]  Emeline General, MD ED   07/07/2019 1616 07/17/2019 2317 Full Code [id number]  Emeline General, MD ED   05/09/2019 0706 05/18/2019 2040 Full Code [id number]  John Giovanni, MD ED   03/20/2019 1507 03/24/2019 2243 Full Code [id number]  Emeline General, MD Inpatient   11/20/2017 0236 11/20/2017 1916 Full Code [id number]  Lorretta Harp, MD ED   08/09/2017 2312 08/17/2017 1950 Full Code [id number]  Lennox Solders, MD Inpatient   09/29/2015 2318 10/03/2015 1841 Full Code [id number]  Bobette Mo, MD Inpatient   09/29/2015 2318 09/29/2015 2318 Full Code [id number]  Bobette Mo, MD Inpatient   01/08/2015 1637 01/10/2015 1918 Full Code [id number]  Hilda Lias, MD Inpatient   06/13/2014 1008 06/16/2014 1800 Full Code [id number]  Catarina Hartshorn, MD Inpatient   04/10/2012 0109 04/11/2012 2008 Full Code [id number]  Tarry Kos, MD Inpatient   12/10/2011 2204 12/12/2011 1423 Full Code [id number]  Donivan Scull, RN Inpatient   05/22/2011 1520 05/30/2011 1553 Full Code [id number]  Minor, Vilinda Blanks, NP ED    Questions for Most Recent Historical Code Status (Order [id number])     Question Answer   By: Consent: discussion documented in EHR            Orders Placed This Encounter  Procedures   NM PET Image Restag (PS) Skull Base To Thigh    Standing Status:   Future    Expected Date:   04/19/2023    Expiration Date:   04/08/2024    If indicated for the ordered procedure, I authorize the administration of a radiopharmaceutical per Radiology protocol:   Yes    Is the patient pregnant?:   No    Preferred imaging location?:   [facility]   CBC with Differential (Cancer Center Only)    Standing Status:   Future    Expected Date:    04/30/2023    Expiration Date:   04/29/2024   CMP (Cancer Center only)    Standing Status:   Future    Expected Date:   04/30/2023    Expiration Date:   04/29/2024   CBC with Differential (Cancer Center Only)    Standing Status:   Future    Expected Date:   05/21/2023    Expiration Date:   05/20/2024   CMP (Cancer Center only)    Standing Status:   Future    Expected Date:   05/21/2023    Expiration Date:   05/20/2024   T4    Standing Status:  Future    Expected Date:   05/21/2023    Expiration Date:   05/20/2024   TSH    Standing Status:   Future    Expected Date:   05/21/2023    Expiration Date:   05/20/2024     Future Appointments  Date Time Provider Department Center  04/12/2023  9:30 AM [facility]POST TREATMENT [facility] None  04/12/2023 12:00 PM Rachel Moulds, LCSW [facility]MED[facility] None  04/12/2023  1:00 PM [facility] FLUSH [facility] None  04/23/2023  9:30 AM [facility]-NM PET CT 1 [facility]-NM [place]  04/28/2023 11:00 AM Rice, Jamesetta Orleans, MD [facility]-G[facility] None  05/12/2023  2:00 PM Ivonne Andrew, NP [facility]-SCC None  06/02/2023  8:30 AM Leslye Peer, MD LBPU-PULCARE None  06/09/2023 11:00 AM [facility]-MR 1 [facility]-MRI [facility]  06/14/2023  7:00 AM [facility]-TUMOR BOARD CONFERENCE [facility]MED[facility] None  06/14/2023  2:00 PM Ronny Bacon, PA-C [facility]RADONC None  07/01/2023  1:50 PM [facility]-ANNUAL WELLNESS VISIT SCC-SCC None  07/21/2023  9:30 AM Eustace Pen, PA-C MC-AFIBC None      This document was completed utilizing speech recognition software. Grammatical errors, random word insertions, pronoun errors, and incomplete sentences are an occasional consequence of this system due to software limitations, ambient noise, and hardware issues. Any formal questions or concerns about the content, text or information contained within the body of this dictation should be directly addressed to the provider for clarification.

## 2023-04-09 NOTE — Patient Instructions (Signed)
 [facility] - A DEPT OF [facility]  Discharge Instructions: Thank you for choosing [facility] to provide your oncology and hematology care.   If you have a lab appointment with the Cancer Center, please go directly to the Cancer Center and check in at the registration area.   Wear comfortable clothing and clothing appropriate for easy access to any Portacath or PICC line.   We strive to give you quality time with your provider. You may need to reschedule your appointment if you arrive late (15 or more minutes).  Arriving late affects you and other patients whose appointments are after yours.  Also, if you miss three or more appointments without notifying the office, you may be dismissed from the clinic at the provider's discretion.      For prescription refill requests, have your pharmacy contact our office and allow 72 hours for refills to be completed.    Today you received the following chemotherapy and/or immunotherapy agents Keytruda, Paclitaxel, Carboplatin.      To help prevent nausea and vomiting after your treatment, we encourage you to take your nausea medication as directed.  BELOW ARE SYMPTOMS THAT SHOULD BE REPORTED IMMEDIATELY: *FEVER GREATER THAN 100.4 F (38 C) OR HIGHER *CHILLS OR SWEATING *NAUSEA AND VOMITING THAT IS NOT CONTROLLED WITH YOUR NAUSEA MEDICATION *UNUSUAL SHORTNESS OF BREATH *UNUSUAL BRUISING OR BLEEDING *URINARY PROBLEMS (pain or burning when urinating, or frequent urination) *BOWEL PROBLEMS (unusual diarrhea, constipation, pain near the anus) TENDERNESS IN MOUTH AND THROAT WITH OR WITHOUT PRESENCE OF ULCERS (sore throat, sores in mouth, or a toothache) UNUSUAL RASH, SWELLING OR PAIN  UNUSUAL VAGINAL DISCHARGE OR ITCHING   Items with * indicate a potential emergency and should be followed up as soon as possible or go to the Emergency Department if any problems should occur.  Please show the CHEMOTHERAPY  ALERT CARD or IMMUNOTHERAPY ALERT CARD at check-in to the Emergency Department and triage nurse.  Should you have questions after your visit or need to cancel or reschedule your appointment, please contact [facility] - A DEPT OF [facility]  Dept: [phone]  and follow the prompts.  Office hours are 8:00 a.m. to 4:30 p.m. Monday - Friday. Please note that voicemails left after 4:00 p.m. may not be returned until the following business day.  We are closed weekends and major holidays. You have access to a nurse at all times for urgent questions. Please call the main number to the clinic Dept: [phone] and follow the prompts.   For any non-urgent questions, you may also contact your provider using MyChart. We now offer e-Visits for anyone 72 and older to request care online for non-urgent symptoms. For details visit [email or web address].   Also download the MyChart app! Go to the app store, search "MyChart", open the app, select [place], and log in with your MyChart username and password.

## 2023-04-09 NOTE — Assessment & Plan Note (Signed)
 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   -Case reviewed by Dr. Meridee Score who recommended workup at a tertiary GI center.  She had biopsy of this on 02/10/2023 at [facility].  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  However immunostains were not obtained.  Previously we requested immunostains to be submitted to determine whether or if it is pancreatic primary versus metastatic disease from lung.  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  Further plan of care to be determined depending on these results.  -Her CA 19-9 was undetectable but CEA was elevated at 230 on 12/30/2022.  This will be monitored on future visits.  It was brought to our attention that immunostains on lung specimen at [facility] showed no reactivity to TTF-1 and there is some discussion whether lung nodules are metastatic from pancreatic primary.  I discussed her case with our pathologist Dr. Jimmy Picket previously.  They are in the process of obtaining slides from [facility] and additional information to follow.  Depending on final consensus, if treatment plan needs to be adjusted, we will do so.  This was discussed with patient and her husband previously.  They verbalized understanding

## 2023-04-09 NOTE — Progress Notes (Signed)
 Per Dr. Arlana Pouch, OK to treat with Cr 3.18, BP 89/59.  Per Dr. Arlana Pouch, OK to administer pre-medications and Keytruda via PICC line (with tip at axillary vein, per 02/08/23 note).  Chemotherapy will be administered via PIV.

## 2023-04-10 ENCOUNTER — Emergency Department (HOSPITAL_COMMUNITY)

## 2023-04-10 ENCOUNTER — Inpatient Hospital Stay (HOSPITAL_COMMUNITY)
Admission: EM | Admit: 2023-04-10 | Discharge: 2023-04-12 | DRG: 186 | Disposition: A | Discharge status: Home / Self Care | Attending: Internal Medicine | Admitting: Internal Medicine

## 2023-04-10 DIAGNOSIS — J9811 Atelectasis: Secondary | ICD-10-CM | POA: Diagnosis present

## 2023-04-10 DIAGNOSIS — F32A Depression, unspecified: Secondary | ICD-10-CM | POA: Diagnosis present

## 2023-04-10 DIAGNOSIS — J90 Pleural effusion, not elsewhere classified: Principal | ICD-10-CM | POA: Diagnosis present

## 2023-04-10 DIAGNOSIS — K3184 Gastroparesis: Secondary | ICD-10-CM | POA: Diagnosis present

## 2023-04-10 DIAGNOSIS — I4891 Unspecified atrial fibrillation: Secondary | ICD-10-CM | POA: Diagnosis present

## 2023-04-10 DIAGNOSIS — M1A9XX0 Chronic gout, unspecified, without tophus (tophi): Secondary | ICD-10-CM | POA: Diagnosis present

## 2023-04-10 DIAGNOSIS — I9589 Other hypotension: Secondary | ICD-10-CM | POA: Diagnosis present

## 2023-04-10 DIAGNOSIS — Z992 Dependence on renal dialysis: Secondary | ICD-10-CM

## 2023-04-10 DIAGNOSIS — Z9981 Dependence on supplemental oxygen: Secondary | ICD-10-CM

## 2023-04-10 DIAGNOSIS — Z86718 Personal history of other venous thrombosis and embolism: Secondary | ICD-10-CM

## 2023-04-10 DIAGNOSIS — G894 Chronic pain syndrome: Secondary | ICD-10-CM | POA: Diagnosis present

## 2023-04-10 DIAGNOSIS — Z83511 Family history of glaucoma: Secondary | ICD-10-CM

## 2023-04-10 DIAGNOSIS — Z7901 Long term (current) use of anticoagulants: Secondary | ICD-10-CM

## 2023-04-10 DIAGNOSIS — E876 Hypokalemia: Secondary | ICD-10-CM | POA: Diagnosis present

## 2023-04-10 DIAGNOSIS — Z8616 Personal history of COVID-19: Secondary | ICD-10-CM

## 2023-04-10 DIAGNOSIS — Z800 Family history of malignant neoplasm of digestive organs: Secondary | ICD-10-CM

## 2023-04-10 DIAGNOSIS — C7801 Secondary malignant neoplasm of right lung: Secondary | ICD-10-CM | POA: Diagnosis present

## 2023-04-10 DIAGNOSIS — Z881 Allergy status to other antibiotic agents status: Secondary | ICD-10-CM

## 2023-04-10 DIAGNOSIS — Z8673 Personal history of transient ischemic attack (TIA), and cerebral infarction without residual deficits: Secondary | ICD-10-CM

## 2023-04-10 DIAGNOSIS — C7931 Secondary malignant neoplasm of brain: Secondary | ICD-10-CM | POA: Diagnosis present

## 2023-04-10 DIAGNOSIS — Z8249 Family history of ischemic heart disease and other diseases of the circulatory system: Secondary | ICD-10-CM

## 2023-04-10 DIAGNOSIS — Z820 Family history of epilepsy and other diseases of the nervous system: Secondary | ICD-10-CM

## 2023-04-10 DIAGNOSIS — E785 Hyperlipidemia, unspecified: Secondary | ICD-10-CM | POA: Diagnosis present

## 2023-04-10 DIAGNOSIS — M109 Gout, unspecified: Secondary | ICD-10-CM | POA: Diagnosis present

## 2023-04-10 DIAGNOSIS — E1122 Type 2 diabetes mellitus with diabetic chronic kidney disease: Secondary | ICD-10-CM | POA: Diagnosis present

## 2023-04-10 DIAGNOSIS — Z905 Acquired absence of kidney: Secondary | ICD-10-CM

## 2023-04-10 DIAGNOSIS — N2581 Secondary hyperparathyroidism of renal origin: Secondary | ICD-10-CM | POA: Diagnosis present

## 2023-04-10 DIAGNOSIS — K219 Gastro-esophageal reflux disease without esophagitis: Secondary | ICD-10-CM | POA: Diagnosis present

## 2023-04-10 DIAGNOSIS — J9611 Chronic respiratory failure with hypoxia: Secondary | ICD-10-CM | POA: Diagnosis present

## 2023-04-10 DIAGNOSIS — Z87891 Personal history of nicotine dependence: Secondary | ICD-10-CM

## 2023-04-10 DIAGNOSIS — D631 Anemia in chronic kidney disease: Secondary | ICD-10-CM | POA: Diagnosis present

## 2023-04-10 DIAGNOSIS — D649 Anemia, unspecified: Secondary | ICD-10-CM | POA: Diagnosis present

## 2023-04-10 DIAGNOSIS — R0602 Shortness of breath: Principal | ICD-10-CM | POA: Insufficient documentation

## 2023-04-10 DIAGNOSIS — J449 Chronic obstructive pulmonary disease, unspecified: Secondary | ICD-10-CM | POA: Diagnosis present

## 2023-04-10 DIAGNOSIS — Z833 Family history of diabetes mellitus: Secondary | ICD-10-CM

## 2023-04-10 DIAGNOSIS — Z79899 Other long term (current) drug therapy: Secondary | ICD-10-CM

## 2023-04-10 DIAGNOSIS — Z981 Arthrodesis status: Secondary | ICD-10-CM

## 2023-04-10 DIAGNOSIS — M797 Fibromyalgia: Secondary | ICD-10-CM | POA: Diagnosis present

## 2023-04-10 DIAGNOSIS — Z7951 Long term (current) use of inhaled steroids: Secondary | ICD-10-CM

## 2023-04-10 DIAGNOSIS — C3490 Malignant neoplasm of unspecified part of unspecified bronchus or lung: Secondary | ICD-10-CM | POA: Diagnosis present

## 2023-04-10 DIAGNOSIS — F419 Anxiety disorder, unspecified: Secondary | ICD-10-CM | POA: Diagnosis present

## 2023-04-10 DIAGNOSIS — C259 Malignant neoplasm of pancreas, unspecified: Secondary | ICD-10-CM | POA: Diagnosis present

## 2023-04-10 DIAGNOSIS — E1143 Type 2 diabetes mellitus with diabetic autonomic (poly)neuropathy: Secondary | ICD-10-CM | POA: Diagnosis present

## 2023-04-10 DIAGNOSIS — R9431 Abnormal electrocardiogram [ECG] [EKG]: Secondary | ICD-10-CM

## 2023-04-10 DIAGNOSIS — N186 End stage renal disease: Secondary | ICD-10-CM | POA: Diagnosis present

## 2023-04-10 DIAGNOSIS — E1142 Type 2 diabetes mellitus with diabetic polyneuropathy: Secondary | ICD-10-CM | POA: Diagnosis present

## 2023-04-10 DIAGNOSIS — H409 Unspecified glaucoma: Secondary | ICD-10-CM | POA: Diagnosis present

## 2023-04-10 DIAGNOSIS — Z91048 Other nonmedicinal substance allergy status: Secondary | ICD-10-CM

## 2023-04-10 MED ORDER — OXYCODONE-ACETAMINOPHEN 5-325 MG PO TABS
1.0000 | ORAL_TABLET | ORAL | Status: DC | PRN
  Administered 2023-04-10: 1 via ORAL
  Filled 2023-04-10: qty 1

## 2023-04-10 NOTE — ED Notes (Signed)
 IV team at bedside.  Pt states she has a picc in L arm, but unable X-ray shows differently.  Pt does not want [name] to draw blood out of R arm.

## 2023-04-10 NOTE — ED Provider Triage Note (Signed)
 Emergency Medicine Provider Triage Evaluation Note  Tina Watkins , a 57 y.o. female  was evaluated in triage.  Pt complains of multiple concerns.  Patient reports that she supposed to come here for dialysis after having hypotension.  Also endorses chest pain.  Review of Systems  Positive: Chest pain, hypotension Negative:   Physical Exam  There were no vitals taken for this visit. Gen:   Awake, no distress   Resp:  Normal effort  MSK:   Moves extremities without difficulty  Other:    Medical Decision Making  Medically screening exam initiated at 8:46 PM.  Appropriate orders placed.  Tina Watkins was informed that the remainder of the evaluation will be completed by another provider, this initial triage assessment does not replace that evaluation, and the importance of remaining in the ED until their evaluation is complete.   Tina Marion, PA-C 04/10/23 2046

## 2023-04-10 NOTE — ED Triage Notes (Signed)
 Patient from home BIB [facility] w/ complaints of swelling to face,abdomen, legs. Patient is a dialysis patient and compliant going Tue/Thurs/ Sat however due to hypotension that is persistent the patient is never able to complete a full session. Reporting SHOB   BP 96/56 76 HR  100% 3 L O2 which is baseline for the patient.

## 2023-04-11 ENCOUNTER — Encounter (HOSPITAL_COMMUNITY): Payer: Self-pay | Admitting: Family Medicine

## 2023-04-11 ENCOUNTER — Other Ambulatory Visit: Payer: Self-pay

## 2023-04-11 DIAGNOSIS — E1143 Type 2 diabetes mellitus with diabetic autonomic (poly)neuropathy: Secondary | ICD-10-CM | POA: Diagnosis present

## 2023-04-11 DIAGNOSIS — G894 Chronic pain syndrome: Secondary | ICD-10-CM | POA: Diagnosis present

## 2023-04-11 DIAGNOSIS — C7801 Secondary malignant neoplasm of right lung: Secondary | ICD-10-CM | POA: Diagnosis present

## 2023-04-11 DIAGNOSIS — C259 Malignant neoplasm of pancreas, unspecified: Secondary | ICD-10-CM | POA: Diagnosis present

## 2023-04-11 DIAGNOSIS — E785 Hyperlipidemia, unspecified: Secondary | ICD-10-CM | POA: Diagnosis present

## 2023-04-11 DIAGNOSIS — K3184 Gastroparesis: Secondary | ICD-10-CM | POA: Diagnosis present

## 2023-04-11 DIAGNOSIS — D631 Anemia in chronic kidney disease: Secondary | ICD-10-CM | POA: Diagnosis present

## 2023-04-11 DIAGNOSIS — J90 Pleural effusion, not elsewhere classified: Secondary | ICD-10-CM | POA: Diagnosis present

## 2023-04-11 DIAGNOSIS — J9811 Atelectasis: Secondary | ICD-10-CM | POA: Diagnosis present

## 2023-04-11 DIAGNOSIS — I4891 Unspecified atrial fibrillation: Secondary | ICD-10-CM | POA: Diagnosis present

## 2023-04-11 DIAGNOSIS — Z992 Dependence on renal dialysis: Secondary | ICD-10-CM | POA: Diagnosis not present

## 2023-04-11 DIAGNOSIS — R0602 Shortness of breath: Secondary | ICD-10-CM | POA: Diagnosis present

## 2023-04-11 DIAGNOSIS — K219 Gastro-esophageal reflux disease without esophagitis: Secondary | ICD-10-CM | POA: Diagnosis present

## 2023-04-11 DIAGNOSIS — C7931 Secondary malignant neoplasm of brain: Secondary | ICD-10-CM | POA: Diagnosis present

## 2023-04-11 DIAGNOSIS — E876 Hypokalemia: Secondary | ICD-10-CM | POA: Diagnosis present

## 2023-04-11 DIAGNOSIS — N2581 Secondary hyperparathyroidism of renal origin: Secondary | ICD-10-CM | POA: Diagnosis present

## 2023-04-11 DIAGNOSIS — N186 End stage renal disease: Secondary | ICD-10-CM | POA: Diagnosis present

## 2023-04-11 DIAGNOSIS — E1142 Type 2 diabetes mellitus with diabetic polyneuropathy: Secondary | ICD-10-CM | POA: Diagnosis present

## 2023-04-11 DIAGNOSIS — Z8616 Personal history of COVID-19: Secondary | ICD-10-CM | POA: Diagnosis not present

## 2023-04-11 DIAGNOSIS — J9611 Chronic respiratory failure with hypoxia: Secondary | ICD-10-CM | POA: Diagnosis present

## 2023-04-11 DIAGNOSIS — J449 Chronic obstructive pulmonary disease, unspecified: Secondary | ICD-10-CM | POA: Diagnosis present

## 2023-04-11 DIAGNOSIS — E1122 Type 2 diabetes mellitus with diabetic chronic kidney disease: Secondary | ICD-10-CM | POA: Diagnosis present

## 2023-04-11 DIAGNOSIS — R9431 Abnormal electrocardiogram [ECG] [EKG]: Secondary | ICD-10-CM

## 2023-04-11 DIAGNOSIS — F32A Depression, unspecified: Secondary | ICD-10-CM | POA: Diagnosis present

## 2023-04-11 DIAGNOSIS — I9589 Other hypotension: Secondary | ICD-10-CM | POA: Diagnosis present

## 2023-04-11 DIAGNOSIS — M797 Fibromyalgia: Secondary | ICD-10-CM | POA: Diagnosis present

## 2023-04-11 LAB — CBC
HCT: 22.4 % — ABNORMAL LOW (ref 36.0–46.0)
Hemoglobin: 7 g/dL — ABNORMAL LOW (ref 12.0–15.0)
MCH: 30.6 pg (ref 26.0–34.0)
MCHC: 31.3 g/dL (ref 30.0–36.0)
MCV: 97.8 fL (ref 80.0–100.0)
Platelets: 106 10*3/uL — ABNORMAL LOW (ref 150–400)
RBC: 2.29 MIL/uL — ABNORMAL LOW (ref 3.87–5.11)
RDW: 16.2 % — ABNORMAL HIGH (ref 11.5–15.5)
WBC: 2.9 10*3/uL — ABNORMAL LOW (ref 4.0–10.5)
nRBC: 0 % (ref 0.0–0.2)

## 2023-04-11 LAB — CBC WITH DIFFERENTIAL/PLATELET
Abs Immature Granulocytes: 0.02 10*3/uL (ref 0.00–0.07)
Basophils Absolute: 0 10*3/uL (ref 0.0–0.1)
Basophils Relative: 0 %
Eosinophils Absolute: 0 10*3/uL (ref 0.0–0.5)
Eosinophils Relative: 0 %
HCT: 23.1 % — ABNORMAL LOW (ref 36.0–46.0)
Hemoglobin: 7.4 g/dL — ABNORMAL LOW (ref 12.0–15.0)
Immature Granulocytes: 1 %
Lymphocytes Relative: 13 %
Lymphs Abs: 0.4 10*3/uL — ABNORMAL LOW (ref 0.7–4.0)
MCH: 30.5 pg (ref 26.0–34.0)
MCHC: 32 g/dL (ref 30.0–36.0)
MCV: 95.1 fL (ref 80.0–100.0)
Monocytes Absolute: 0.1 10*3/uL (ref 0.1–1.0)
Monocytes Relative: 4 %
Neutro Abs: 2.6 10*3/uL (ref 1.7–7.7)
Neutrophils Relative %: 82 %
Platelets: 123 10*3/uL — ABNORMAL LOW (ref 150–400)
RBC: 2.43 MIL/uL — ABNORMAL LOW (ref 3.87–5.11)
RDW: 15.9 % — ABNORMAL HIGH (ref 11.5–15.5)
WBC: 3.2 10*3/uL — ABNORMAL LOW (ref 4.0–10.5)
nRBC: 0 % (ref 0.0–0.2)

## 2023-04-11 LAB — COMPREHENSIVE METABOLIC PANEL
ALT: 59 U/L — ABNORMAL HIGH (ref 0–44)
AST: 68 U/L — ABNORMAL HIGH (ref 15–41)
Albumin: 2.9 g/dL — ABNORMAL LOW (ref 3.5–5.0)
Alkaline Phosphatase: 308 U/L — ABNORMAL HIGH (ref 38–126)
Anion gap: 14 (ref 5–15)
BUN: 11 mg/dL (ref 6–20)
CO2: 30 mmol/L (ref 22–32)
Calcium: 8.5 mg/dL — ABNORMAL LOW (ref 8.9–10.3)
Chloride: 94 mmol/L — ABNORMAL LOW (ref 98–111)
Creatinine, Ser: 2.03 mg/dL — ABNORMAL HIGH (ref 0.44–1.00)
GFR, Estimated: 28 mL/min — ABNORMAL LOW (ref >60)
Glucose, Bld: 105 mg/dL — ABNORMAL HIGH (ref 70–99)
Potassium: 3.3 mmol/L — ABNORMAL LOW (ref 3.5–5.1)
Sodium: 138 mmol/L (ref 135–145)
Total Bilirubin: 0.7 mg/dL (ref 0.0–1.2)
Total Protein: 5.9 g/dL — ABNORMAL LOW (ref 6.5–8.1)

## 2023-04-11 LAB — HEPATITIS B SURFACE ANTIGEN: Hepatitis B Surface Ag: NONREACTIVE

## 2023-04-11 LAB — MAGNESIUM: Magnesium: 2.1 mg/dL (ref 1.7–2.4)

## 2023-04-11 LAB — PHOSPHORUS: Phosphorus: 4.9 mg/dL — ABNORMAL HIGH (ref 2.5–4.6)

## 2023-04-11 LAB — TROPONIN I (HIGH SENSITIVITY)
Troponin I (High Sensitivity): 31 ng/L — ABNORMAL HIGH (ref <18)
Troponin I (High Sensitivity): 33 ng/L — ABNORMAL HIGH (ref <18)

## 2023-04-11 LAB — PREPARE RBC (CROSSMATCH)

## 2023-04-11 LAB — MRSA NEXT GEN BY PCR, NASAL: MRSA by PCR Next Gen: NOT DETECTED

## 2023-04-11 MED ORDER — FLUTICASONE FUROATE-VILANTEROL 200-25 MCG/ACT IN AEPB
1.0000 | INHALATION_SPRAY | Freq: Every day | RESPIRATORY_TRACT | Status: DC
  Administered 2023-04-12: 1 via RESPIRATORY_TRACT
  Filled 2023-04-11: qty 28

## 2023-04-11 MED ORDER — ACETAMINOPHEN 650 MG RE SUPP: 650.0000 mg | Freq: Four times a day (QID) | RECTAL | Status: DC | PRN

## 2023-04-11 MED ORDER — DORZOLAMIDE HCL-TIMOLOL MAL 2-0.5 % OP SOLN
1.0000 [drp] | Freq: Every evening | OPHTHALMIC | Status: DC
  Administered 2023-04-12: 1 [drp] via OPHTHALMIC
  Filled 2023-04-11: qty 10

## 2023-04-11 MED ORDER — OXYCODONE-ACETAMINOPHEN 5-325 MG PO TABS: 1.0000 | ORAL_TABLET | ORAL | Status: DC | PRN

## 2023-04-11 MED ORDER — ALTEPLASE 2 MG IJ SOLR: 2.0000 mg | Freq: Once | INTRAMUSCULAR | Status: DC | PRN

## 2023-04-11 MED ORDER — NEPRO/CARBSTEADY PO LIQD: 237.0000 mL | ORAL | Status: DC | PRN

## 2023-04-11 MED ORDER — PENTAFLUOROPROP-TETRAFLUOROETH EX AERO: 1.0000 | INHALATION_SPRAY | CUTANEOUS | Status: DC | PRN

## 2023-04-11 MED ORDER — FAMOTIDINE 20 MG PO TABS
20.0000 mg | ORAL_TABLET | Freq: Every day | ORAL | Status: DC
  Administered 2023-04-11 – 2023-04-12 (×2): 20 mg via ORAL
  Filled 2023-04-11 (×2): qty 1

## 2023-04-11 MED ORDER — GABAPENTIN 100 MG PO CAPS
100.0000 mg | ORAL_CAPSULE | Freq: Three times a day (TID) | ORAL | Status: DC | PRN
  Administered 2023-04-11 – 2023-04-12 (×3): 100 mg via ORAL
  Filled 2023-04-11 (×3): qty 1

## 2023-04-11 MED ORDER — ACETAMINOPHEN 325 MG PO TABS: 650.0000 mg | ORAL_TABLET | Freq: Four times a day (QID) | ORAL | Status: DC | PRN

## 2023-04-11 MED ORDER — ALBUMIN HUMAN 25 % IV SOLN
25.0000 g | INTRAVENOUS | Status: AC | PRN
Start: 2023-04-11 — End: 2023-04-11
  Administered 2023-04-11: 25 g via INTRAVENOUS
  Filled 2023-04-11: qty 100

## 2023-04-11 MED ORDER — PANTOPRAZOLE SODIUM 40 MG PO TBEC
80.0000 mg | DELAYED_RELEASE_TABLET | Freq: Every day | ORAL | Status: DC
  Administered 2023-04-11 – 2023-04-12 (×2): 80 mg via ORAL
  Filled 2023-04-11 (×2): qty 2

## 2023-04-11 MED ORDER — MIDODRINE HCL 5 MG PO TABS
15.0000 mg | ORAL_TABLET | Freq: Three times a day (TID) | ORAL | Status: DC
  Administered 2023-04-11 – 2023-04-12 (×5): 15 mg via ORAL
  Filled 2023-04-11 (×5): qty 3

## 2023-04-11 MED ORDER — METOCLOPRAMIDE HCL 5 MG PO TABS
10.0000 mg | ORAL_TABLET | Freq: Every day | ORAL | Status: DC
  Administered 2023-04-12: 10 mg via ORAL
  Filled 2023-04-11: qty 2

## 2023-04-11 MED ORDER — SUCROFERRIC OXYHYDROXIDE 500 MG PO CHEW
500.0000 mg | CHEWABLE_TABLET | Freq: Three times a day (TID) | ORAL | Status: DC
  Administered 2023-04-11 – 2023-04-12 (×2): 500 mg via ORAL
  Filled 2023-04-11 (×5): qty 1

## 2023-04-11 MED ORDER — OXYCODONE HCL 5 MG PO TABS
5.0000 mg | ORAL_TABLET | ORAL | Status: DC | PRN
  Administered 2023-04-11 – 2023-04-12 (×2): 5 mg via ORAL
  Filled 2023-04-11 (×2): qty 1

## 2023-04-11 MED ORDER — HEPARIN SODIUM (PORCINE) 1000 UNIT/ML DIALYSIS
1000.0000 [IU] | INTRAMUSCULAR | Status: DC | PRN
  Filled 2023-04-11: qty 1

## 2023-04-11 MED ORDER — HYDROMORPHONE HCL 1 MG/ML IJ SOLN
0.5000 mg | INTRAMUSCULAR | Status: DC | PRN
  Administered 2023-04-11 – 2023-04-12 (×7): 0.5 mg via INTRAVENOUS
  Filled 2023-04-11: qty 0.5
  Filled 2023-04-11: qty 1
  Filled 2023-04-11 (×2): qty 0.5
  Filled 2023-04-11: qty 1
  Filled 2023-04-11 (×2): qty 0.5

## 2023-04-11 MED ORDER — LIDOCAINE HCL (PF) 1 % IJ SOLN: 5.0000 mL | INTRAMUSCULAR | Status: DC | PRN

## 2023-04-11 MED ORDER — ALBUMIN HUMAN 25 % IV SOLN: 25.0000 g | INTRAVENOUS | Status: DC | PRN

## 2023-04-11 MED ORDER — ALLOPURINOL 100 MG PO TABS
100.0000 mg | ORAL_TABLET | Freq: Every day | ORAL | Status: DC
  Administered 2023-04-11 – 2023-04-12 (×2): 100 mg via ORAL
  Filled 2023-04-11 (×2): qty 1

## 2023-04-11 MED ORDER — OXYCODONE-ACETAMINOPHEN 5-325 MG PO TABS
1.0000 | ORAL_TABLET | Freq: Once | ORAL | Status: AC
  Administered 2023-04-11: 1 via ORAL
  Filled 2023-04-11: qty 1

## 2023-04-11 MED ORDER — DEXAMETHASONE 4 MG PO TABS
8.0000 mg | ORAL_TABLET | Freq: Every day | ORAL | Status: DC
  Administered 2023-04-11 – 2023-04-12 (×2): 8 mg via ORAL
  Filled 2023-04-11 (×2): qty 2

## 2023-04-11 MED ORDER — AMIODARONE HCL 200 MG PO TABS
100.0000 mg | ORAL_TABLET | Freq: Every day | ORAL | Status: DC
  Administered 2023-04-11 – 2023-04-12 (×2): 100 mg via ORAL
  Filled 2023-04-11 (×2): qty 1

## 2023-04-11 MED ORDER — ONDANSETRON HCL 4 MG PO TABS: 4.0000 mg | ORAL_TABLET | Freq: Four times a day (QID) | ORAL | Status: DC | PRN

## 2023-04-11 MED ORDER — LIDOCAINE-PRILOCAINE 2.5-2.5 % EX CREA: 1.0000 | TOPICAL_CREAM | CUTANEOUS | Status: DC | PRN

## 2023-04-11 MED ORDER — SODIUM CHLORIDE 0.9% FLUSH: 10.0000 mL | INTRAVENOUS | Status: DC | PRN

## 2023-04-11 MED ORDER — SERTRALINE HCL 50 MG PO TABS
25.0000 mg | ORAL_TABLET | Freq: Every day | ORAL | Status: DC
  Administered 2023-04-11 – 2023-04-12 (×2): 25 mg via ORAL
  Filled 2023-04-11 (×2): qty 1

## 2023-04-11 MED ORDER — OXYCODONE-ACETAMINOPHEN 10-325 MG PO TABS: 1.0000 | ORAL_TABLET | Freq: Four times a day (QID) | ORAL | Status: DC | PRN

## 2023-04-11 MED ORDER — HEPARIN SODIUM (PORCINE) 1000 UNIT/ML DIALYSIS: 2000.0000 [IU] | Freq: Once | INTRAMUSCULAR | Status: DC

## 2023-04-11 MED ORDER — ONDANSETRON HCL 4 MG/2ML IJ SOLN: 4.0000 mg | Freq: Four times a day (QID) | INTRAMUSCULAR | Status: DC | PRN

## 2023-04-11 MED ORDER — SODIUM CHLORIDE 0.9% FLUSH
10.0000 mL | Freq: Two times a day (BID) | INTRAVENOUS | Status: DC
Start: 2023-04-11 — End: 2023-04-12
  Administered 2023-04-11 – 2023-04-12 (×3): 10 mL

## 2023-04-11 MED ORDER — CHLORHEXIDINE GLUCONATE CLOTH 2 % EX PADS
6.0000 | MEDICATED_PAD | Freq: Every day | CUTANEOUS | Status: DC
  Administered 2023-04-12: 6 via TOPICAL

## 2023-04-11 MED ORDER — APIXABAN 2.5 MG PO TABS
2.5000 mg | ORAL_TABLET | Freq: Two times a day (BID) | ORAL | Status: DC
Start: 2023-04-11 — End: 2023-04-12
  Administered 2023-04-11 – 2023-04-12 (×3): 2.5 mg via ORAL
  Filled 2023-04-11 (×3): qty 1

## 2023-04-11 MED ORDER — ANTICOAGULANT SODIUM CITRATE 4% (200MG/5ML) IV SOLN: 5.0000 mL | Status: DC | PRN

## 2023-04-11 MED ORDER — SODIUM CHLORIDE 0.9% IV SOLUTION: Freq: Once | INTRAVENOUS | Status: AC

## 2023-04-11 MED ORDER — CHLORHEXIDINE GLUCONATE CLOTH 2 % EX PADS
6.0000 | MEDICATED_PAD | Freq: Every day | CUTANEOUS | Status: DC
  Administered 2023-04-11 – 2023-04-12 (×2): 6 via TOPICAL

## 2023-04-11 NOTE — Assessment & Plan Note (Signed)
 58 year old with chronic respiratory failure on 4L oxygen [place] and lung adenocarcinoma vs. 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 Progressing cancer  -obs to tele  -she is stable on 4L oxygen [place], her baseline -CXR shows large left pleural effusion with near complete atelectasis of left lung. Appears similar to all lung imaging dating back to January 2025.  -she states she has been unable to have anything taken off at dialysis due to hypotension and she is over her dry weight -nephrology has been consulted and plans for dialysis tonight  -hgb down trended to 7.0, will plan to transfuse 1 unit PRBC tonight with HD, goal hgb closer to 8 with chemotherapy  -CXR stable. Has PET scan in a few days to monitor malignancy progression or regression

## 2023-04-11 NOTE — Assessment & Plan Note (Signed)
Continue eliquis/statin

## 2023-04-11 NOTE — Assessment & Plan Note (Addendum)
 Hgb of 7.4 and downtrend from 8.2 about two days ago Likely will need 1 unit of PRBC, but will wait for HD  Repeat 7.0 will transfuse 1 unit at HD, nephrology aware  No active bleeding, continue eliquis

## 2023-04-11 NOTE — Progress Notes (Signed)
 New Admission Note:   Arrival Method: stretcher Mental Orientation: aa+ox4 Telemetry: box 14 Assessment: Completed Skin: c/d/i IV: PICC intact Pain: denies Tubes: n/a Safety Measures: Safety Fall Prevention Plan has been given, discussed and signed Admission: Completed [facility] [place] Orientation: Patient has been orientated to the room, unit and staff.  Family: present  Orders have been reviewed and implemented. Will continue to monitor the patient. Call light has been placed within reach and bed alarm has been activated.   Margarita Grizzle, RN

## 2023-04-11 NOTE — ED Provider Notes (Signed)
 [facility]Y DEPT [facility] Emergency Department Provider Note MRN:  [id number]  Arrival date & time: 04/11/23     Chief Complaint   SOB  History of Present Illness   Tina Watkins is a 58 y.o. year-old female presents to the ED with chief complaint of SOB.  Hx of lung cancer.  States that she has had worsening SOB.  Wears 4L [place] at baseline.  Reports that she was dialyzed yesterday, but they weren't able to take off fluid because of hypotension.  She states that she feels more short of breath.  History provided by patient.   Review of Systems  Pertinent positive and negative review of systems noted in HPI.    Physical Exam   Vitals:   04/11/23 0545 04/11/23 0600  BP: (!) 88/62 (!) 88/54  Pulse: 72 73  Resp: (!) 23 13  Temp:    SpO2: 100% 100%    CONSTITUTIONAL:  short of breath-appearing, NAD NEURO:  Alert and oriented x 3, CN 3-12 grossly intact EYES:  eyes equal and reactive ENT/NECK:  Supple, no stridor  CARDIO:  normal rate, regular rhythm, appears well-perfused  PULM:  Mildly increased WOB, diminished on the left GI/GU:  non-distended,  MSK/SPINE:  No gross deformities, no edema, moves all extremities  SKIN:  no rash, atraumatic   *Additional and/or pertinent findings included in MDM below  Diagnostic and Interventional Summary    EKG Interpretation Date/Time:    Ventricular Rate:    PR Interval:    QRS Duration:    QT Interval:    QTC Calculation:   R Axis:      Text Interpretation:         Labs Reviewed  COMPREHENSIVE METABOLIC PANEL - Abnormal; Notable for the following components:      Result Value   Potassium 3.3 (*)    Chloride 94 (*)    Glucose, Bld 105 (*)    Creatinine, Ser 2.03 (*)    Calcium 8.5 (*)    Total Protein 5.9 (*)    Albumin 2.9 (*)    AST 68 (*)    ALT 59 (*)    Alkaline Phosphatase 308 (*)    GFR, Estimated 28 (*)    All other components within normal limits  CBC WITH DIFFERENTIAL/PLATELET - Abnormal;  Notable for the following components:   WBC 3.2 (*)    RBC 2.43 (*)    Hemoglobin 7.4 (*)    HCT 23.1 (*)    RDW 15.9 (*)    Platelets 123 (*)    Lymphs Abs 0.4 (*)    All other components within normal limits  TROPONIN I (HIGH SENSITIVITY) - Abnormal; Notable for the following components:   Troponin I (High Sensitivity) 33 (*)    All other components within normal limits  TROPONIN I (HIGH SENSITIVITY) - Abnormal; Notable for the following components:   Troponin I (High Sensitivity) 31 (*)    All other components within normal limits    DG Chest Portable 1 View  Final Result      Medications  oxyCODONE-acetaminophen (PERCOCET/ROXICET) 5-325 MG per tablet 1 tablet (1 tablet Oral Given 04/11/23 0511)     Procedures  /  Critical Care Procedures  ED Course and Medical Decision Making  I have reviewed the triage vital signs, the nursing notes, and pertinent available records from the EMR.  Social Determinants Affecting Complexity of Care: Patient has no clinically significant social determinants affecting this chief complaint.[name]   ED  Course: Clinical Course as of 04/11/23 1610  Sun Apr 11, 2023  0615 DG Chest Portable 1 View Large left pleural effusion [RB]    Clinical Course User Index [RB] Roxy Horseman, PA-C    Medical Decision Making Patient here with shortness of breath.  Wears 4 L nasal cannula at baseline.  Reports worsening shortness of breath.  She also has significant peripheral edema.  She was dialyzed yesterday, but states they were unable to take any fluid off due to hypotension.  Blood pressures remain a little low here.  She has flat but slightly elevated troponins.  Potassium is not elevated.  Will consult with nephrology to see about dialysis.  Seek consultation notes and discussion below.    Risk Prescription drug management. Decision regarding hospitalization.         Consultants: I consulted with Dr. Arlean Hopping, who says that dialysis isn't  done on Sunday, but he could dialyze patient after 6pm tonight. Will consult medicine for admission.  I consulted with Dr. Antionette Char, who is agreeable with plan for admission.  We discussed that nephro, Dr. Arlean Hopping will get patient dialyzed tonight after 6pm when the RN comes in.  We discussed that patient may benefit from a thoracentesis for her large left pleural effusion.  It looks like she last had this done in November and had about of fluid drained off.   Treatment and Plan: Patient's exam and diagnostic results are concerning for SOB, large left pleural effusion.  Feel that patient will need admission to the hospital for further treatment and evaluation.  Patient seen by and discussed with attending physician, Dr. Elayne Snare, who agrees with plan..  Final Clinical Impressions(s) / ED Diagnoses     ICD-10-CM   1. SOB (shortness of breath)  R06.02       ED Discharge Orders     None         Discharge Instructions Discussed with and Provided to Patient:   Discharge Instructions   None      Roxy Horseman, PA-C 04/11/23 0618    Royanne Foots, DO 04/15/23 1443

## 2023-04-11 NOTE — Plan of Care (Signed)

## 2023-04-11 NOTE — Assessment & Plan Note (Signed)
 On tele Magnesium wnl Trend

## 2023-04-11 NOTE — Assessment & Plan Note (Signed)
Continue reglan

## 2023-04-11 NOTE — Assessment & Plan Note (Signed)
 No signs/sx of exacerbation  Continue advair

## 2023-04-11 NOTE — Assessment & Plan Note (Signed)
 Stable on 4L oxygen [place]

## 2023-04-11 NOTE — Assessment & Plan Note (Signed)
 Optimize electrolytes Keep on telemetry Avoid qt prolonging drugs  Repeat ekg in AM

## 2023-04-11 NOTE — Assessment & Plan Note (Signed)
 In NSR, continue amiodarone and eliquis

## 2023-04-11 NOTE — Assessment & Plan Note (Signed)
 Continue pepcid and PPI daily

## 2023-04-11 NOTE — Assessment & Plan Note (Signed)
 Continue zoloft

## 2023-04-11 NOTE — ED Notes (Signed)
 After consulting with pt and chart, IV team states it is ok to use indwelling IV.

## 2023-04-11 NOTE — Assessment & Plan Note (Signed)
 Continue allopurinol

## 2023-04-11 NOTE — Consult Note (Signed)
 Renal Service Consult Note [facility]  Tina Watkins 04/11/2023 Tina Krabbe, MD Requesting Physician: Dr. Artis Flock  Reason for Consult: ESRD pt w/ SOB HPI: The patient is a 58 y.o. year-old w/ PMH as below who presented to ED with chief complaint of shortness of breath.  History of lung cancer with excessive fluid in her left lung related to that.  Patient stated she left 3 kg over her dry weight after her last dialysis on Saturday.  She wears 3 to 4 L of oxygen nasal cannula at home.  She also feels she is swollen in the face and in the hands and in her feet.  Patient was admitted, we are asked to see for dialysis.    Pt seen in room, main issue is shortness of breath and patient provided history as above.  Denies any chest pain or stomach pain no fevers cough or chills.  Chest x-ray shows complete whiteout on the left side as previously, and the right lung shows progressive miliary spread of her known cancer.   PMH Anxiety Blind right eye Chronic lower back pain Depression E. coli bacteremia ESRD on HD Gastroparesis Gout History of DVT Hypotension, chronic Atrial fibrillation History of stroke   ROS - denies CP, no joint pain, no HA, no blurry vision, no rash, no diarrhea, no nausea/ vomiting, no dysuria, no difficulty voiding   Past Medical History  Past Medical History:  Diagnosis Date   Anxiety    Bacteremia due to Gram-negative bacteria 05/23/2011   Blind    right eye   Blind right eye    CHF (congestive heart failure) (HCC)    Chronic lower back pain    Complication of anesthesia    "woke up during OR; I have an extremely high tolerance" (12/11/2011) 1 procedure was graft; the other procedures were procedures that are typically done with sedation.   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   Dysrhythmia    "tachycardia" (12/11/2011) new onset afib 10/15/14 EKG   E coli bacteremia  06/18/2011   Elevated LDL cholesterol level 12/2018   ESRD (end stage renal disease) (HCC) 06/12/2011   Tues-Thurs-Sat dialysis   Fibromyalgia    Gastroparesis    GERD (gastroesophageal reflux disease)    Glaucoma    right eye   Gout    H/O carpal tunnel syndrome    Headache 10/2019   no current problem per patient on 11/12/22   Headache(784.0)    "not often anymore" (12/11/2011)   Herpes genitalia 1994   History of blood transfusion    "more than a few times" (12/11/2011)   History of COVID-19, most recently 09/2022 05/09/2019   History of deep vein thrombosis (DVT) of right lower extremity 11/20/2017   History of stomach ulcers    Hypotension    Iron deficiency anemia    New onset a-fib (HCC)    10/15/14 EKG   Osteopenia    Peripheral neuropathy 11/2018   Pneumonia    x several but years ago   Pressure ulcer of sacral region, stage 1 07/2019   Seizures (HCC) 1994   "post transplant; only have had that one" (12/11/2011)   Spinal stenosis in cervical region    Stroke [facility])     left basal ganglia lacunar infarct; Right frontal lobe lacunar infarct.   Stroke [facility]) ~ 1999; 2001   "briefly lost my vision; lost my right eye" (12/11/2011)  Vitamin D deficiency 12/2018   Past Surgical History  Past Surgical History:  Procedure Laterality Date   ANTERIOR CERVICAL DECOMP/DISCECTOMY FUSION N/A 01/08/2015   Procedure: Anterior Cervical Three-Four/Four-Five Decompression/Diskectomy/Fusion;  Surgeon: Hilda Lias, MD;  Location: [facility] NEURO ORS;  Service: Neurosurgery;  Laterality: N/A;  C3-4 C4-5 Anterior cervical decompression/diskectomy/fusion   APPENDECTOMY  ~ 2004   BIOPSY  09/12/2020   Procedure: BIOPSY;  Surgeon: Rachael Fee, MD;  Location: [facility] ENDOSCOPY;  Service: Endoscopy;;   BRONCHIAL BIOPSY  11/16/2022   Procedure: BRONCHIAL BIOPSIES;  Surgeon: Leslye Peer, MD;  Location: [facility] ENDOSCOPY;  Service: Pulmonary;;   BRONCHIAL BRUSHINGS  11/16/2022   Procedure: BRONCHIAL  BRUSHINGS;  Surgeon: Leslye Peer, MD;  Location: [facility] ENDOSCOPY;  Service: Pulmonary;;   BRONCHIAL NEEDLE ASPIRATION BIOPSY  11/16/2022   Procedure: BRONCHIAL NEEDLE ASPIRATION BIOPSIES;  Surgeon: Leslye Peer, MD;  Location: [facility] ENDOSCOPY;  Service: Pulmonary;;   CATARACT EXTRACTION     right eye   COLONOSCOPY     COLONOSCOPY WITH PROPOFOL N/A 09/12/2020   Procedure: COLONOSCOPY WITH PROPOFOL;  Surgeon: Rachael Fee, MD;  Location: [facility] ENDOSCOPY;  Service: Endoscopy;  Laterality: N/A;   ENUCLEATION  2001   "right"   ESOPHAGOGASTRODUODENOSCOPY (EGD) WITH PROPOFOL N/A 04/21/2012   Procedure: ESOPHAGOGASTRODUODENOSCOPY (EGD) WITH PROPOFOL;  Surgeon: Rachael Fee, MD;  Location: [facility] ENDOSCOPY;  Service: Endoscopy;  Laterality: N/A;   ESOPHAGOGASTRODUODENOSCOPY (EGD) WITH PROPOFOL N/A 09/12/2020   Procedure: ESOPHAGOGASTRODUODENOSCOPY (EGD) WITH PROPOFOL;  Surgeon: Rachael Fee, MD;  Location: [facility] ENDOSCOPY;  Service: Endoscopy;  Laterality: N/A;   FIDUCIAL MARKER PLACEMENT  11/16/2022   Procedure: FIDUCIAL MARKER PLACEMENT;  Surgeon: Leslye Peer, MD;  Location: [facility] ENDOSCOPY;  Service: Pulmonary;;   HEMOSTASIS CONTROL  11/16/2022   Procedure: HEMOSTASIS CONTROL;  Surgeon: Leslye Peer, MD;  Location: [facility] ENDOSCOPY;  Service: Pulmonary;;   INSERTION OF DIALYSIS CATHETER  1988   "AV graft LUA & LFA; LUA worked for 1 day; LFA never worked"   IR FLUORO GUIDE CV LINE RIGHT  07/11/2019   IR FLUORO GUIDE CV LINE RIGHT  10/20/2019   IR FLUORO GUIDE CV LINE RIGHT  05/31/2020   IR FLUORO GUIDE CV LINE RIGHT  03/29/2023   IR PTA VENOUS EXCEPT DIALYSIS CIRCUIT  05/31/2020   IR RADIOLOGY PERIPHERAL GUIDED IV START  07/11/2019   IR US GUIDE VASC ACCESS RIGHT  07/11/2019   IR US GUIDE VASC ACCESS RIGHT  07/11/2019   IR US GUIDE VASC ACCESS RIGHT  07/11/2019   IR VENOCAVAGRAM IVC  05/31/2020   IR VENOCAVAGRAM IVC  03/29/2023   KIDNEY TRANSPLANT  1994; 1999; 2005   "right"   MULTIPLE TOOTH EXTRACTIONS      POLYPECTOMY  09/12/2020   Procedure: POLYPECTOMY;  Surgeon: Rachael Fee, MD;  Location: [facility] ENDOSCOPY;  Service: Endoscopy;;   RIGHT HEART CATH N/A 03/23/2019   Procedure: RIGHT HEART CATH;  Surgeon: Dolores Patty, MD;  Location: [facility] INVASIVE CV LAB;  Service: Cardiovascular;  Laterality: N/A;   TONSILLECTOMY     TOTAL NEPHRECTOMY  1988?; 1994; 2005   VIDEO BRONCHOSCOPY WITH RADIAL ENDOBRONCHIAL ULTRASOUND  11/16/2022   Procedure: VIDEO BRONCHOSCOPY WITH RADIAL ENDOBRONCHIAL ULTRASOUND;  Surgeon: Leslye Peer, MD;  Location: [facility] ENDOSCOPY;  Service: Pulmonary;;   Family History  Family History  Problem Relation Age of Onset   Glaucoma Mother    Pancreatic cancer Father    Multiple sclerosis Brother    Diabetes  Maternal Uncle    Hypertension Maternal Grandmother    Breast cancer Neg Hx    Social History  reports that she has quit smoking. Her smoking use included cigarettes. She has a 52.5 pack-year smoking history. She has never been exposed to tobacco smoke. She has never used smokeless tobacco. She reports that she does not currently use alcohol. She reports current drug use. Frequency: 1.00 time per week. Drug: Marijuana. Allergies  Allergies  Allergen Reactions   Levofloxacin Itching and Rash   Tape Other (See Comments)    "Certain surgical tapes peel off my skin"   Home medications Prior to Admission medications   Medication Sig Start Date End Date Taking? Authorizing Provider  allopurinol (ZYLOPRIM) 100 MG tablet Take 0.5 tablets (50 mg total) by mouth 2 (two) times a week. Patient taking differently: Take 100 mg by mouth daily in the afternoon. 12/24/22  Yes Tawkaliyar, Roya, DO  amiodarone (PACERONE) 200 MG tablet Take 0.5 tablets (100 mg total) by mouth daily. 07/03/22  Yes Eustace Pen, PA-C  apixaban (ELIQUIS) 2.5 MG TABS tablet Take 1 tablet (2.5 mg total) by mouth 2 (two) times daily. Okay to restart this medication on 11/18/2022 11/16/22  Yes Leslye Peer, MD  Colchicine 0.6 MG CAPS Take 0.6 mg by mouth daily as needed (gout flare up). 01/12/22  Yes Ivonne Andrew, NP  cyclobenzaprine (FLEXERIL) 5 MG tablet Take 1 tablet (5 mg total) by mouth at bedtime as needed. 08/21/22  Yes Rice, Jamesetta Orleans, MD  dexamethasone (DECADRON) 4 MG tablet Take 2 tablets (8mg ) by mouth daily starting the day after carboplatin for 3 days. Take with food 01/20/23  Yes Pasam, Avinash, MD  diphenhydrAMINE (BENADRYL) 25 mg capsule Take 1 capsule (25 mg total) by mouth every 8 (eight) hours as needed. 07/21/19  Yes Kallie Locks, FNP  dorzolamide-timolol (COSOPT) 2-0.5 % ophthalmic solution Place 1 drop into the left eye every evening. 12/13/21  Yes [provider]  famotidine (PEPCID) 20 MG tablet Take 1 tablet (20 mg total) by mouth 2 (two) times daily. 03/15/23  Yes Ivonne Andrew, NP  fluticasone-salmeterol (ADVAIR HFA) 119-14 MCG/ACT inhaler Inhale 2 puffs into the lungs 2 (two) times daily. 04/28/21  Yes Martina Sinner, MD  gabapentin (NEURONTIN) 100 MG capsule Take 100 mg by mouth 3 (three) times daily as needed (nerve pain).   Yes [provider]  Lactulose 20 GM/30ML SOLN Take 30 mLs (20 g total) by mouth 2 (two) times daily as needed (constipation). 03/01/23  Yes Dorcas Carrow, MD  metoCLOPramide (REGLAN) 10 MG tablet Take 1 tablet (10 mg total) by mouth 3 (three) times daily before meals. Patient taking differently: Take 10 mg by mouth daily in the afternoon. 12/02/22  Yes Meryl Dare, MD  midodrine (PROAMATINE) 10 MG tablet TAKE 1.5 TABLETS (15 MG TOTAL) BY MOUTH 3 (THREE) TIMES DAILY. NEEDS FOLLOW UP APPOINTMENT FOR ANYMORE REFILLS Patient taking differently: Take 20 mg by mouth 3 (three) times daily. NEEDS FOLLOW UP APPOINTMENT FOR ANYMORE REFILLS 01/18/23  Yes Bensimhon, Bevelyn Buckles, MD  omeprazole (PRILOSEC) 40 MG capsule TAKE 1 CAPSULE (40 MG TOTAL) BY MOUTH DAILY. 01/15/23  Yes Meryl Dare, MD  ondansetron (ZOFRAN) 8  MG tablet Take 1 tablet (8 mg total) by mouth every 8 (eight) hours as needed for nausea or vomiting. Start on the third day after carboplatin. 01/20/23  Yes Pasam, Avinash, MD  oxyCODONE-acetaminophen (PERCOCET) 10-325 MG tablet Take 1 tablet  by mouth 4 (four) times daily as needed for pain. 09/08/19  Yes [provider]  sertraline (ZOLOFT) 25 MG tablet Take 1 tablet (25 mg total) by mouth daily. 01/08/23  Yes Ivonne Andrew, NP  VELPHORO 500 MG chewable tablet Chew 500 mg by mouth See admin instructions. Take one tablet by mouth three times a daily with meals and snakes per patient   Yes [provider]  AURYXIA 1 GM 210 MG(Fe) tablet Take 420 mg by mouth 3 (three) times daily. Patient not taking: Reported on 04/11/2023    [provider]  calcium-vitamin D (OSCAL WITH D) 500-5 MG-MCG tablet Take 1 tablet by mouth 2 (two) times daily. Patient not taking: Reported on 04/11/2023 03/21/23   Arnetha Courser, MD  prochlorperazine (COMPAZINE) 10 MG tablet Take 10 mg by mouth every 6 (six) hours as needed. Patient not taking: Reported on 04/11/2023 04/10/23   [provider]     Vitals:   04/11/23 1145 04/11/23 1200 04/11/23 1236 04/11/23 1251  BP:  (!) 86/59 (!) 82/62 96/63  Pulse:  72 73 77  Resp:  11 16 18   Temp: 97.9 F (36.6 C)   (!) 97.5 F (36.4 C)  TempSrc: Oral   Oral  SpO2:  100% 100% 98%  Weight:    47.8 kg  Height:    4\' 11"  (1.499 m)   Exam Gen alert, no distress No rash, cyanosis or gangrene Sclera anicteric, throat clear  No jvd or bruits Chest R lung is clear, L lung is diminished w/ bronchial BS through lower 1/2 RRR no MRG Abd soft ntnd no mass or ascites +bs GU deferred MS no joint effusions or deformity Ext 1+ bilat UE and LE  LE edema, puffy face as well Neuro is alert, Ox 3 , nf    R fem TDC in place       Renal-related home meds: - auryxia 420 ac tid - midodrine 15 tid - velphoro 500 ac tid    OP HD: TTS G-O  4h  B400    46.5kg  2K bath  TDC  Heparin 2000 - last HD 3/08, post wt 48.6kg (2.1 kg over) - rocaltrol 3.25 mcg three times per week    Assessment/ Plan:  SOB - w/ peripheral edema, chronic lung issues related to her metastatic lung disease. Pt on 3-4 L [place] O2 at home, getting same here but is more SOB w/ DOE.  Looks edematous on exam. Will plan extra HD tonight upstairs. Needs some blood also, will try to give late in HD.  ESRD - on HD TTS. Has not missed HD. HD as above.  Chronic hypotension -normal BP's are in the 90s, they continue to pull fluid as long as blood pressure is over 85 in dialysis.  She rarely has blood pressures over 100. Cont midodrine 15 tid here. IV alb prn w/ HD.  Volume - as above.  Anemia of esrd -hemoglobin between 7 and 9, follow, consider blood transfusion. Secondary hyperparathyroidism -corrected calcium is in range. Add on phos   Rob River Ambrosio  MD [facility] 04/11/2023, 3:10 PM  Recent Labs  Lab 04/09/23 0809 04/10/23 2334  HGB 8.2* 7.4*  ALBUMIN 3.6 2.9*  CALCIUM 8.9 8.5*  CREATININE 3.18* 2.03*  K 3.5 3.3*   Inpatient medications:  allopurinol  100 mg Oral Q1500   amiodarone  100 mg Oral Daily   apixaban  2.5 mg Oral BID   Chlorhexidine Gluconate Cloth  6 each  Topical Daily   dexamethasone  8 mg Oral Daily   dorzolamide-timolol  1 drop Left Eye QPM   famotidine  20 mg Oral Daily   fluticasone furoate-vilanterol  1 puff Inhalation Daily   [START ON 04/12/2023] metoCLOPramide  10 mg Oral Q1500   midodrine  15 mg Oral TID   pantoprazole  80 mg Oral Daily   sertraline  25 mg Oral Daily   sodium chloride flush  10-40 mL Intracatheter Q12H   sucroferric oxyhydroxide  500 mg Oral TID with meals    acetaminophen **OR** acetaminophen, gabapentin, HYDROmorphone (DILAUDID) injection, ondansetron **OR** ondansetron (ZOFRAN) IV, oxyCODONE-acetaminophen **AND** oxyCODONE, sodium chloride flush

## 2023-04-11 NOTE — H&P (Signed)
 History and Physical    Patient: Tina Watkins MVH:[id number] DOB: 1965/07/31 DOA: 04/10/2023 DOS: the patient was seen and examined on 04/11/2023 PCP: Ivonne Andrew, NP  Patient coming from: Home - lives with her fiance. Ambulates independently    Chief Complaint: shortness of breath   HPI: Tina Watkins is a 58 y.o. 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 She states this started acutely today. She states they have not been able to take any fluid off at dialysis because her blood pressure has remained too low. She knows she is very over her dry weight. She is hurting all over and wants her pain medication. Also concerned as she hasn't had her steroids after chemo that she takes x 3 days.   Denies any fever/chills, vision changes/headaches, chest pain or palpitations, abdominal pain, N/V/D,+  leg swelling.    She does not smoke anymore and she does not drink alcohol.   ER Course:  vitals: afebrile, bp: 76/43, HR: 73, RR: 16, oxygen: 100%RA Pertinent labs: hgb: 7.4, potassium: 3.3, creatinine: 2.03, AST: 68, ALT: 59,  Troponin 33>31 CXR: large left pleural effusion with near complete atelectasis of the left lung. Miliary nodularity throughout the right lung in keeping with miliary pulmonary metastatic disease given patient's hx of lung cancer.  In ED: nephrology consulted. TRH asked to admit.     Review of Systems: As mentioned in the history of present illness. All other systems reviewed and are negative. Past Medical History:  Diagnosis Date    Anxiety    Bacteremia due to Gram-negative bacteria 05/23/2011   Blind    right eye   Blind right eye    CHF (congestive heart failure) (HCC)    Chronic lower back pain    Complication of anesthesia    "woke up during OR; I have an extremely high tolerance" (12/11/2011) 1 procedure was graft; the other procedures were procedures that are typically done with sedation.   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   Dysrhythmia    "tachycardia" (12/11/2011) new onset afib 10/15/14 EKG   E coli bacteremia 06/18/2011   Elevated LDL cholesterol level 12/2018   ESRD (end stage renal disease) (HCC) 06/12/2011   Tues-Thurs-Sat dialysis   Fibromyalgia    Gastroparesis    GERD (gastroesophageal reflux disease)    Glaucoma    right eye   Gout    H/O carpal tunnel syndrome    Headache 10/2019   no current problem per patient on 11/12/22   Headache(784.0)    "not often anymore" (12/11/2011)   Herpes genitalia 1994   History of blood transfusion    "more than a few times" (12/11/2011)   History of COVID-19, most recently 09/2022 05/09/2019   History of deep vein thrombosis (DVT) of right lower extremity 11/20/2017   History of stomach ulcers    Hypotension    Iron deficiency anemia    New onset a-fib (HCC)    10/15/14 EKG   Osteopenia    Peripheral neuropathy 11/2018   Pneumonia    x several but years  ago   Pressure ulcer of sacral region, stage 1 07/2019   Seizures (HCC) 1994   "post transplant; only have had that one" (12/11/2011)   Spinal stenosis in cervical region    Stroke [facility])     left basal ganglia lacunar infarct; Right frontal lobe lacunar infarct.   Stroke [facility]) ~ 1999; 2001   "briefly lost my vision; lost my right eye" (12/11/2011)   Vitamin D deficiency 12/2018   Past Surgical History:  Procedure Laterality Date   ANTERIOR CERVICAL DECOMP/DISCECTOMY FUSION N/A 01/08/2015   Procedure: Anterior Cervical  Three-Four/Four-Five Decompression/Diskectomy/Fusion;  Surgeon: Hilda Lias, MD;  Location: [facility] NEURO ORS;  Service: Neurosurgery;  Laterality: N/A;  C3-4 C4-5 Anterior cervical decompression/diskectomy/fusion   APPENDECTOMY  ~ 2004   BIOPSY  09/12/2020   Procedure: BIOPSY;  Surgeon: Rachael Fee, MD;  Location: [facility] ENDOSCOPY;  Service: Endoscopy;;   BRONCHIAL BIOPSY  11/16/2022   Procedure: BRONCHIAL BIOPSIES;  Surgeon: Leslye Peer, MD;  Location: [facility] ENDOSCOPY;  Service: Pulmonary;;   BRONCHIAL BRUSHINGS  11/16/2022   Procedure: BRONCHIAL BRUSHINGS;  Surgeon: Leslye Peer, MD;  Location: [facility] ENDOSCOPY;  Service: Pulmonary;;   BRONCHIAL NEEDLE ASPIRATION BIOPSY  11/16/2022   Procedure: BRONCHIAL NEEDLE ASPIRATION BIOPSIES;  Surgeon: Leslye Peer, MD;  Location: [facility] ENDOSCOPY;  Service: Pulmonary;;   CATARACT EXTRACTION     right eye   COLONOSCOPY     COLONOSCOPY WITH PROPOFOL N/A 09/12/2020   Procedure: COLONOSCOPY WITH PROPOFOL;  Surgeon: Rachael Fee, MD;  Location: [facility] ENDOSCOPY;  Service: Endoscopy;  Laterality: N/A;   ENUCLEATION  2001   "right"   ESOPHAGOGASTRODUODENOSCOPY (EGD) WITH PROPOFOL N/A 04/21/2012   Procedure: ESOPHAGOGASTRODUODENOSCOPY (EGD) WITH PROPOFOL;  Surgeon: Rachael Fee, MD;  Location: [facility] ENDOSCOPY;  Service: Endoscopy;  Laterality: N/A;   ESOPHAGOGASTRODUODENOSCOPY (EGD) WITH PROPOFOL N/A 09/12/2020   Procedure: ESOPHAGOGASTRODUODENOSCOPY (EGD) WITH PROPOFOL;  Surgeon: Rachael Fee, MD;  Location: [facility] ENDOSCOPY;  Service: Endoscopy;  Laterality: N/A;   FIDUCIAL MARKER PLACEMENT  11/16/2022   Procedure: FIDUCIAL MARKER PLACEMENT;  Surgeon: Leslye Peer, MD;  Location: [facility] ENDOSCOPY;  Service: Pulmonary;;   HEMOSTASIS CONTROL  11/16/2022   Procedure: HEMOSTASIS CONTROL;  Surgeon: Leslye Peer, MD;  Location: [facility] ENDOSCOPY;  Service: Pulmonary;;   INSERTION OF DIALYSIS CATHETER  1988   "AV graft LUA & LFA; LUA worked for 1 day; LFA never worked"    IR FLUORO GUIDE CV LINE RIGHT  07/11/2019   IR FLUORO GUIDE CV LINE RIGHT  10/20/2019   IR FLUORO GUIDE CV LINE RIGHT  05/31/2020   IR FLUORO GUIDE CV LINE RIGHT  03/29/2023   IR PTA VENOUS EXCEPT DIALYSIS CIRCUIT  05/31/2020   IR RADIOLOGY PERIPHERAL GUIDED IV START  07/11/2019   IR US GUIDE VASC ACCESS RIGHT  07/11/2019   IR US GUIDE VASC ACCESS RIGHT  07/11/2019   IR US GUIDE VASC ACCESS RIGHT  07/11/2019   IR VENOCAVAGRAM IVC  05/31/2020   IR VENOCAVAGRAM IVC  03/29/2023   KIDNEY TRANSPLANT  1994; 1999; 2005   "right"   MULTIPLE TOOTH EXTRACTIONS     POLYPECTOMY  09/12/2020   Procedure: POLYPECTOMY;  Surgeon: Rachael Fee, MD;  Location: [facility] ENDOSCOPY;  Service: Endoscopy;;   RIGHT HEART CATH N/A 03/23/2019   Procedure: RIGHT HEART CATH;  Surgeon: Dolores Patty, MD;  Location: [facility] INVASIVE CV LAB;  Service: Cardiovascular;  Laterality: N/A;   TONSILLECTOMY     TOTAL NEPHRECTOMY  1988?; 1994; 2005   VIDEO BRONCHOSCOPY WITH RADIAL ENDOBRONCHIAL ULTRASOUND  11/16/2022   Procedure: VIDEO BRONCHOSCOPY WITH RADIAL ENDOBRONCHIAL ULTRASOUND;  Surgeon: Leslye Peer, MD;  Location: [facility] ENDOSCOPY;  Service: Pulmonary;;   Social History:  reports that she has quit smoking. Her smoking use included cigarettes. She has a 52.5 pack-year smoking history. She has never been exposed to tobacco smoke. She has never used smokeless tobacco. She reports that she does not currently use alcohol. She reports current drug use. Frequency: 1.00 time per week. Drug: Marijuana.  Allergies  Allergen Reactions   Levofloxacin Itching and Rash   Tape Other (See Comments)    "Certain surgical tapes peel off my skin"    Family History  Problem Relation Age of Onset   Glaucoma Mother    Pancreatic cancer Father    Multiple sclerosis Brother    Diabetes Maternal Uncle    Hypertension Maternal Grandmother    Breast cancer Neg Hx     Prior to Admission medications   Medication Sig Start Date End Date Taking?  Authorizing Provider  allopurinol (ZYLOPRIM) 100 MG tablet Take 0.5 tablets (50 mg total) by mouth 2 (two) times a week. Patient taking differently: Take 100 mg by mouth daily in the afternoon. 12/24/22   Tawkaliyar, Roya, DO  amiodarone (PACERONE) 200 MG tablet Take 0.5 tablets (100 mg total) by mouth daily. 07/03/22   Eustace Pen, PA-C  apixaban (ELIQUIS) 2.5 MG TABS tablet Take 1 tablet (2.5 mg total) by mouth 2 (two) times daily. Okay to restart this medication on 11/18/2022 11/16/22   Leslye Peer, MD  AURYXIA 1 GM 210 MG(Fe) tablet Take 420 mg by mouth 3 (three) times daily.    [provider]  calcium-vitamin D (OSCAL WITH D) 500-5 MG-MCG tablet Take 1 tablet by mouth 2 (two) times daily. 03/21/23   Arnetha Courser, MD  Colchicine 0.6 MG CAPS Take 0.6 mg by mouth daily as needed (gout flare up). Patient not taking: Reported on 04/09/2023 01/12/22   Ivonne Andrew, NP  cyclobenzaprine (FLEXERIL) 5 MG tablet Take 1 tablet (5 mg total) by mouth at bedtime as needed. 08/21/22   Rice, Jamesetta Orleans, MD  dexamethasone (DECADRON) 4 MG tablet Take 2 tablets (8mg ) by mouth daily starting the day after carboplatin for 3 days. Take with food Patient not taking: Reported on 04/09/2023 01/20/23   Pasam, Archie Patten, MD  diphenhydrAMINE (BENADRYL) 25 mg capsule Take 1 capsule (25 mg total) by mouth every 8 (eight) hours as needed. Patient taking differently: Take 25 mg by mouth every 8 (eight) hours as needed for allergies or sleep. 07/21/19   Kallie Locks, FNP  dorzolamide-timolol (COSOPT) 2-0.5 % ophthalmic solution Place 1 drop into the left eye every evening. 12/13/21   [provider]  famotidine (PEPCID) 20 MG tablet Take 1 tablet (20 mg total) by mouth 2 (two) times daily. 03/15/23   Ivonne Andrew, NP  fluticasone-salmeterol (ADVAIR HFA) 478-29 MCG/ACT inhaler Inhale 2 puffs into the lungs 2 (two) times daily. 04/28/21   Martina Sinner, MD  gabapentin (NEURONTIN) 100 MG  capsule Take 100 mg by mouth 3 (three) times daily as needed (nerve pain).    [provider]  Lactulose 20 GM/30ML SOLN Take 30 mLs (20 g total) by mouth 2 (two) times daily as needed (constipation). 03/01/23   Dorcas Carrow, MD  metoCLOPramide (REGLAN) 10 MG tablet Take 1 tablet (10 mg total) by mouth 3 (three) times daily  before meals. Patient taking differently: Take 10 mg by mouth daily in the afternoon. 12/02/22   Meryl Dare, MD  midodrine (PROAMATINE) 10 MG tablet TAKE 1.5 TABLETS (15 MG TOTAL) BY MOUTH 3 (THREE) TIMES DAILY. NEEDS FOLLOW UP APPOINTMENT FOR ANYMORE REFILLS 01/18/23   Bensimhon, Bevelyn Buckles, MD  omeprazole (PRILOSEC) 40 MG capsule TAKE 1 CAPSULE (40 MG TOTAL) BY MOUTH DAILY. 01/15/23   Meryl Dare, MD  ondansetron (ZOFRAN) 8 MG tablet Take 1 tablet (8 mg total) by mouth every 8 (eight) hours as needed for nausea or vomiting. Start on the third day after carboplatin. Patient not taking: Reported on 04/09/2023 01/20/23   Pasam, Archie Patten, MD  oxyCODONE-acetaminophen (PERCOCET) 10-325 MG tablet Take 1 tablet by mouth 4 (four) times daily as needed for pain. 09/08/19   [provider]  sertraline (ZOLOFT) 25 MG tablet Take 1 tablet (25 mg total) by mouth daily. 01/08/23   Ivonne Andrew, NP  VELPHORO 500 MG chewable tablet Chew 500 mg by mouth See admin instructions. Take one tablet by mouth three times a daily with meals and snakes per patient    [provider]  zolpidem (AMBIEN) 10 MG tablet Take 1 tablet (10 mg total) by mouth at bedtime as needed for sleep. Patient not taking: Reported on 04/09/2023 08/24/19   Maurilio Lovely D, DO    Physical Exam: Vitals:   04/11/23 1200 04/11/23 1236 04/11/23 1251 04/11/23 1606  BP: (!) 86/59 (!) 82/62 96/63 92/61   Pulse: 72 73 77   Resp: 11 16 18    Temp:   (!) 97.5 F (36.4 C) 98 F (36.7 C)  TempSrc:   Oral Oral  SpO2: 100% 100% 98% 98%  Weight:   47.8 kg   Height:   4\' 11"  (1.499 m)    General:   Appears calm and comfortable and is in NAD Eyes:  PERRL, EOMI, normal lids, iris. Left eye with white over cornea  ENT:  grossly normal hearing, lips & tongue, mmm; appropriate dentition Neck:  no LAD, masses or thyromegaly; no carotid bruits. Fluid bilateral clavicle areas.  Cardiovascular:  RRR, no m/r/g. No LE edema.  Respiratory:   Right lung field with crackles at bases, otherwise clear. Left lung with crackles at bases and decreased air movement throughout entire lung field  Abdomen:  soft, NT, ND, NABS Back:   normal alignment, no CVAT Skin:  no rash or induration seen on limited exam Musculoskeletal:  grossly normal tone BUE/BLE, good ROM, no bony abnormality Lower extremity:  No LE edema.  Limited foot exam with no ulcerations.  2+ distal pulses. Psychiatric:  grossly normal mood and affect, speech fluent and appropriate, AOx3 Neurologic:  CN 2-12 grossly intact, moves all extremities in coordinated fashion, sensation intact   Radiological Exams on Admission: Independently reviewed - see discussion in A/P where applicable  DG Chest Portable 1 View Result Date: 04/10/2023 CLINICAL DATA:  Chest pain EXAM: PORTABLE CHEST 1 VIEW COMPARISON:  03/20/2023 FINDINGS: Large left pleural effusion is present with near complete atelectasis of the left lung. Miliary nodularity is seen throughout the right lung in keeping with miliary pulmonary metastatic disease given patient's history of lung and hepatocellular carcinoma. No pneumothorax. No pleural effusion on the right. Low superior vena caval stent extends into the superior right atrium. Inferiorly approaching hemodialysis catheter tip noted within the high right atrium. Left upper extremity midline catheter tip overlies the left hemithorax likely within a chest wall collateral. Cardiac silhouette partially  obscured by left pleural effusion. Pulmonary vascularity is normal within the aerated right lung. No acute bone abnormality. IMPRESSION: 1.  Large left pleural effusion with near complete atelectasis of the left lung. 2. Miliary nodularity throughout the right lung in keeping with miliary pulmonary metastatic disease given patient's history of lung and hepatocellular carcinoma. 3. Left upper extremity midline catheter tip overlies the left hemithorax likely within a chest wall collateral. Electronically Signed   By: Helyn Numbers M.D.   On: 04/10/2023 21:45    EKG: Independently reviewed.  NSR with rate 74; nonspecific ST changes with no evidence of acute ischemia Prolonged QT  Repeat EKG: prolonged QT resolved.    Labs on Admission: I have personally reviewed the available labs and imaging studies at the time of the admission.  Pertinent labs:   hgb: 7.4,  potassium: 3.3,  creatinine: 2.03,  AST: 68,  ALT: 59,  Troponin 33>31  Assessment and Plan: Principal Problem:   Shortness of breath Active Problems:   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)   Acute on chronic anemia   Hypokalemia   Chronic hypoxic respiratory failure (HCC)   Chronic pain syndrome   Atrial fibrillation (HCC)   Gastroparesis   History of CVA (cerebrovascular accident)   GERD (gastroesophageal reflux disease)   Gout   COPD (chronic obstructive pulmonary disease) (HCC)   Anxiety   ESRD (end stage renal disease) (HCC)    Assessment and Plan: * Shortness of breath 58 year old with chronic respiratory failure on 4L oxygen [place] and lung adenocarcinoma vs. 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 Progressing cancer  -obs to tele  -she is stable on 4L oxygen [place], her baseline -CXR shows large left pleural effusion with near complete atelectasis of left lung. Appears similar to all lung imaging dating back to January 2025.  -she states she has been unable to have anything taken off at dialysis due to hypotension and she is over her dry  weight -nephrology has been consulted and plans for dialysis tonight  -hgb down trended to 7.0, will plan to transfuse 1 unit PRBC tonight with HD, goal hgb closer to 8 with chemotherapy  -CXR stable. Has PET scan in a few days to monitor malignancy progression or regression   Primary malignant neoplasm of lung with metastasis to brain [facility]) -lung cancer diagnosed 11/2022 by bronchoscopy  -Pancreatic lesion biopsy at [facility] on 02/10/2023 came back positive for adenocarcinoma. Unsure if it is primary pancreatic or metastatic from lung.  -MRI brain 01/29/2023 showed 1.2 cm enhancing mass in the left insula, consistent with metastatic disease.  -On 02/22/2023, CT abdomen and pelvis showed liver lesions.  Metastatic disease could not be ruled out. Following results of pancreatic biopsy immunostains, oncology considering liver biopsy to determine etiology of liver lesions-lung versus pancreatic primary.  -oncology received results from [facility]. ? Primary pancreatic with mets to lung. Pathologists are in the process of obtaining slides from [facility] and additional information to follow.  -received cycle 4 of chemo on 04/09/23: [place], Taxol and Keytruda  -PET scan in a few days  -3 day course of decadron following chemo    Acute on chronic anemia Hgb of 7.4 and downtrend from 8.2 about two days ago Likely will need 1 unit of PRBC, but will wait for HD  Repeat 7.0 will transfuse 1 unit  at HD, nephrology aware  No active bleeding, continue eliquis    Hypokalemia On tele Magnesium wnl Trend   Chronic hypoxic respiratory failure (HCC) Stable on 4L oxygen [place]   Chronic pain syndrome Continue home oxycodone She states this is not relieving any pain Dilaudid for breakthrough May need palliative care for pain consult due to progressing cancer   Atrial fibrillation (HCC) In NSR, continue amiodarone and eliquis   Gastroparesis Continue reglan   History of CVA  (cerebrovascular accident) Continue eliquis/statin   GERD (gastroesophageal reflux disease) Continue pepcid and PPI daily   Gout Continue allopurinol  COPD (chronic obstructive pulmonary disease) (HCC) No signs/sx of exacerbation  Continue advair   Anxiety Continue zoloft   Advance Care Planning:   Code Status: Full Code   Consults: nephrology   DVT Prophylaxis: eliquis   Family Communication: none   Severity of Illness: The appropriate patient status for this patient is INPATIENT. Inpatient status is judged to be reasonable and necessary in order to provide the required intensity of service to ensure the patient's safety. The patient's presenting symptoms, physical exam findings, and initial radiographic and laboratory data in the context of their chronic comorbidities is felt to place them at high risk for further clinical deterioration. Furthermore, it is not anticipated that the patient will be medically stable for discharge from the hospital within 2 midnights of admission.   * I certify that at the point of admission it is my clinical judgment that the patient will require inpatient hospital care spanning beyond 2 midnights from the point of admission due to high intensity of service, high risk for further deterioration and high frequency of surveillance required.*  Author: Orland Mustard, MD 04/11/2023 5:30 PM  For on call review [email or web address].

## 2023-04-11 NOTE — Assessment & Plan Note (Addendum)
-  lung cancer diagnosed 11/2022 by bronchoscopy  -Pancreatic lesion biopsy at [facility] on 02/10/2023 came back positive for adenocarcinoma. Unsure if it is primary pancreatic or metastatic from lung.  -MRI brain 01/29/2023 showed 1.2 cm enhancing mass in the left insula, consistent with metastatic disease.  -On 02/22/2023, CT abdomen and pelvis showed liver lesions.  Metastatic disease could not be ruled out. Following results of pancreatic biopsy immunostains, oncology considering liver biopsy to determine etiology of liver lesions-lung versus pancreatic primary.  -oncology received results from [facility]. ? Primary pancreatic with mets to lung. Pathologists are in the process of obtaining slides from [facility] and additional information to follow.  -received cycle 4 of chemo on 04/09/23: [place], Taxol and Keytruda  -PET scan in a few days  -3 day course of decadron following chemo

## 2023-04-11 NOTE — Assessment & Plan Note (Signed)
 Continue home oxycodone She states this is not relieving any pain Dilaudid for breakthrough May need palliative care for pain consult due to progressing cancer

## 2023-04-12 ENCOUNTER — Inpatient Hospital Stay (HOSPITAL_COMMUNITY)

## 2023-04-12 ENCOUNTER — Inpatient Hospital Stay: Payer: Medicare Other | Admitting: Licensed Clinical Social Worker

## 2023-04-12 ENCOUNTER — Ambulatory Visit
Admission: RE | Admit: 2023-04-12 | Discharge: 2023-04-12 | Disposition: A | Discharge status: Home / Self Care | Payer: Medicare Other | Source: Ambulatory Visit | Attending: Oncology | Admitting: Oncology

## 2023-04-12 ENCOUNTER — Inpatient Hospital Stay: Payer: Medicare Other

## 2023-04-12 DIAGNOSIS — C3412 Malignant neoplasm of upper lobe, left bronchus or lung: Secondary | ICD-10-CM

## 2023-04-12 DIAGNOSIS — R0602 Shortness of breath: Secondary | ICD-10-CM | POA: Diagnosis not present

## 2023-04-12 LAB — CBC
HCT: 26.6 % — ABNORMAL LOW (ref 36.0–46.0)
Hemoglobin: 8.7 g/dL — ABNORMAL LOW (ref 12.0–15.0)
MCH: 30.3 pg (ref 26.0–34.0)
MCHC: 32.7 g/dL (ref 30.0–36.0)
MCV: 92.7 fL (ref 80.0–100.0)
Platelets: 112 10*3/uL — ABNORMAL LOW (ref 150–400)
RBC: 2.87 MIL/uL — ABNORMAL LOW (ref 3.87–5.11)
RDW: 17.1 % — ABNORMAL HIGH (ref 11.5–15.5)
WBC: 3.6 10*3/uL — ABNORMAL LOW (ref 4.0–10.5)
nRBC: 0 % (ref 0.0–0.2)

## 2023-04-12 LAB — COMPREHENSIVE METABOLIC PANEL
ALT: 44 U/L (ref 0–44)
AST: 46 U/L — ABNORMAL HIGH (ref 15–41)
Albumin: 3.4 g/dL — ABNORMAL LOW (ref 3.5–5.0)
Alkaline Phosphatase: 274 U/L — ABNORMAL HIGH (ref 38–126)
Anion gap: 13 (ref 5–15)
BUN: 9 mg/dL (ref 6–20)
CO2: 28 mmol/L (ref 22–32)
Calcium: 9.3 mg/dL (ref 8.9–10.3)
Chloride: 94 mmol/L — ABNORMAL LOW (ref 98–111)
Creatinine, Ser: 1.59 mg/dL — ABNORMAL HIGH (ref 0.44–1.00)
GFR, Estimated: 38 mL/min — ABNORMAL LOW (ref >60)
Glucose, Bld: 101 mg/dL — ABNORMAL HIGH (ref 70–99)
Potassium: 3.6 mmol/L (ref 3.5–5.1)
Sodium: 135 mmol/L (ref 135–145)
Total Bilirubin: 1 mg/dL (ref 0.0–1.2)
Total Protein: 6.3 g/dL — ABNORMAL LOW (ref 6.5–8.1)

## 2023-04-12 MED ORDER — LIDOCAINE HCL 1 % IJ SOLN
INTRAMUSCULAR | Status: AC
  Filled 2023-04-12: qty 20

## 2023-04-12 MED ORDER — LIDOCAINE HCL 1 % IJ SOLN
10.0000 mL | Freq: Once | INTRAMUSCULAR | Status: AC
  Administered 2023-04-12: 10 mL via INTRADERMAL
  Filled 2023-04-12: qty 10

## 2023-04-12 MED ORDER — HEPARIN SOD (PORK) LOCK FLUSH 100 UNIT/ML IV SOLN
250.0000 [IU] | INTRAVENOUS | Status: AC | PRN
  Administered 2023-04-12: 250 [IU]

## 2023-04-12 NOTE — Plan of Care (Signed)
  Problem: Education: Goal: Knowledge of risk factors and measures for prevention of condition will improve Outcome: Progressing   Problem: Coping: Goal: Psychosocial and spiritual needs will be supported Outcome: Progressing   Problem: Respiratory: Goal: Will maintain a patent airway Outcome: Progressing Goal: Complications related to the disease process, condition or treatment will be avoided or minimized Outcome: Progressing   Problem: Education: Goal: Ability to describe self-care measures that may prevent or decrease complications (Diabetes Survival Skills Education) will improve Outcome: Progressing Goal: Individualized Educational Video(s) Outcome: Progressing   Problem: Coping: Goal: Ability to adjust to condition or change in health will improve Outcome: Progressing   Problem: Fluid Volume: Goal: Ability to maintain a balanced intake and output will improve Outcome: Progressing   Problem: Health Behavior/Discharge Planning: Goal: Ability to identify and utilize available resources and services will improve Outcome: Progressing Goal: Ability to manage health-related needs will improve Outcome: Progressing   Problem: Metabolic: Goal: Ability to maintain appropriate glucose levels will improve Outcome: Progressing   Problem: Nutritional: Goal: Maintenance of adequate nutrition will improve Outcome: Progressing Goal: Progress toward achieving an optimal weight will improve Outcome: Progressing   Problem: Skin Integrity: Goal: Risk for impaired skin integrity will decrease Outcome: Progressing   Problem: Tissue Perfusion: Goal: Adequacy of tissue perfusion will improve Outcome: Progressing   

## 2023-04-12 NOTE — Progress Notes (Signed)
 6 min ambulation oxygen test.  86% RA at rest. 95% on 4L at rest. Pt dropped bellow 88% on 4L with ambulation requiring 5L to maintain 95% with short-distance. Pt unable to complete 3 min walk due to dizziness and weakness. Pt does not walk more 1 min at home. She usually works from the living room to bedroom which is less than 1 minute. MD notified.

## 2023-04-12 NOTE — Progress Notes (Signed)
 [facility] CSW Progress Note  Clinical Child psychotherapist  received a call from pt regarding advanced directives clinic appointment today.  Pt states she is hospitalized at [facility] and will not be able to make the appt.  Pt also states she has a 1:00 appt at the cancer center for an injection that she is going to try to come in for.  CSW informed Marguerita Merles, LCSW of the above who is reaching out to the medical team to see if pt's injection can be given inpatient or to provide pt w/ some direction regarding the injection.  CSW also informed pt advanced directives can be completed w/ the chaplan while she is inpatient.  Pt encouraged to remain inpatient if she is not medically stable for discharge.      Rachel Moulds, LCSW Clinical Social Worker [facility]

## 2023-04-12 NOTE — Discharge Summary (Signed)
 Physician Discharge Summary  Tina Watkins:[id number] DOB: 08-14-1965 DOA: 04/10/2023  PCP: Ivonne Andrew, NP  Admit date: 04/10/2023 Discharge date: 04/12/2023  Admitted From: Home Discharge disposition: Home  Recommendations at discharge:  Follow-up with oncology as an outpatient.   Brief narrative: Tina Watkins is a 58 y.o. 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   3/8, patient presented to the ED with complaint of sudden onset shortness of breath, chest tightness.  She reports that lately they have not been able to take any fluid off at dialysis because her blood pressure has remained too low. She knows she is very over her dry weight.   In the ED, patient is afebrile, heart rate in 70s, blood pressure in 80s and 90s Labs with hemoglobin 7, creatinine 2.03 CXR showed large left pleural effusion with near complete atelectasis of the left lung. Miliary nodularity throughout the right lung in keeping with miliary pulmonary metastatic disease given patient's hx of lung cancer.  Nephrology was consulted. Admitted to [facility]  Subjective: Patient was seen and examined this morning. Pleasant middle-aged African-American female.  Looks older for her age.  Fianc at bedside. Chart reviewed.  No fever, blood pressure in 80s and 90s, breathing on 4 L oxygen Most recent labs this morning with hemoglobin 8.7, BUN/creatinine 9/1.59 Underwent thoracentesis later this morning Able to ambulate after the procedure.  Assessment and plan: Acute dyspnea Large left pleural effusion ESRD-HD-TTS Presents for an acute shortness of breath, chest tightness in the setting of pre-existing comorbidities. Chronic  oxygen dependence due to COPD, lung cancer, metastasis from pancreatic cancer, dialysis dependent status. Acute presentation of shortness of breath likely due to accumulation of large left pleural effusion as she has not been able to be taken off any fluid due to hypotension.   Seen by nephrology.  Dialysis done last night. Thoracentesis done -550 mL of clear amber for clear fluid drained.  Feels better after the procedure able to ambulate on baseline oxygen.  Chronic respiratory failure with hypoxia COPD Bronchodilators Chronically on 4 L oxygen by nasal cannula Currently on the same.  Acute on chronic anemia Baseline hemoglobin between 8 and 9.  Presented with low hemoglobin of 7. 1 unit PRBC transfused.  Hemoglobin improved 8.7 No active bleeding.  Eliquis continues Continue to monitor Recent Labs    12/23/22 1036 12/24/22 1114 01/13/23 1018 01/13/23 1019 01/30/23 1649 03/31/23 0832 04/09/23 0809 04/10/23 2334 04/11/23 1645 04/12/23 0336  HGB  --    < > 8.9*  --    < > 7.0* 8.2* 7.4* 7.0* 8.7*  MCV  --    < > 89.3  --    < > 93.6 95.9 95.1 97.8 92.7  VITAMINB12  --   --  794  --   --   --   --   --   --   --   FOLATE  --   --   --  3.4*  --   --   --   --   --   --   FERRITIN 8,657*  --   --  1,711*  --   --   --   --   --   --   TIBC 167*  --  218*  --   --   --   --   --   --   --   IRON 138  --  65  --   --   --   --   --   --   --    < > = values in this interval not displayed.   Chronic hypotension Blood pressure usually in 90s.   Continue midodrine 15 mg 3 times daily    Primary malignant neoplasm of lung with metastasis to brain -lung cancer diagnosed 11/2022 by bronchoscopy  -Pancreatic lesion biopsy at [facility] on 02/10/2023 came back positive for adenocarcinoma. Unsure if it is primary pancreatic or metastatic from lung.  -MRI brain 01/29/2023 showed 1.2 cm enhancing mass in the left insula, consistent with metastatic disease.  -On 02/22/2023, CT  abdomen and pelvis showed liver lesions.  Metastatic disease could not be ruled out. Following results of pancreatic biopsy immunostains, oncology considering liver biopsy to determine etiology of liver lesions-lung versus pancreatic primary.  -oncology received results from [facility]. ? Primary pancreatic with mets to lung. Pathologists are in the process of obtaining slides from [facility] and additional information to follow.  -received cycle 4 of chemo on 04/09/23: [place], Taxol and Keytruda  -PET scan in a few days  Case was d/w oncology team this am. She will have a follow up with them tomorrow.   Chronic pain syndrome Continue oxycodone as before. Dilaudid as needed IV for breakthrough pain  H/o A-fib Currently normal sinus rhythm Continue amiodarone and Eliquis  H/o CVA Continue Eliquis/statin  Hypokalemia Improved with replacement Recent Labs  Lab 04/09/23 0809 04/10/23 2334 04/11/23 0825 04/12/23 0336  K 3.5 3.3*  --  3.6  MG  --   --  2.1  --   PHOS  --   --  4.9*  --    Gastroparesis Continue reglan    Gout Continue allopurinol  Anxiety Continue zoloft   Goals of care   Code Status: Full Code   Consultants: Nephrology.  Discussed with oncology Family Communication: Fianc at bedside  Diet:  Diet Order             Diet general           Diet renal with fluid restriction Fluid restriction: 1200 mL Fluid; Room service appropriate? Yes; Fluid consistency: Thin  Diet effective now                   Nutritional status:  Body mass index is 21.27 kg/m.       Wounds:  -    Discharge Exam:   Vitals:   04/12/23 1053 04/12/23 1134 04/12/23 1151 04/12/23 1254  BP: 95/70 (!) 89/62 (!) 87/61 102/73  Pulse: 75   79  Resp: 14     Temp: 97.7 F (36.5 C)     TempSrc: Oral     SpO2: 100%   100%  Weight:      Height:        Body mass index is 21.27 kg/m.  General exam: Pleasant, middle-aged African-American female. Skin: No rashes,  lesions or ulcers. HEENT: Atraumatic, normocephalic, no obvious bleeding Lungs: Diminished auscultation on the left before thoracentesis CVS: S1, S2, no murmur,   GI/Abd: Soft, nontender, nondistended, bowel sound present,   CNS: Alert, awake, oriented x 3 Psychiatry: Mood appropriate,  Extremities: No pedal edema, no calf tenderness,   Follow ups:    Follow-up Information  Ivonne Andrew, NP Follow up.   Specialties: Pulmonary Disease, Endocrinology Contact information: [address] [place] [address] [phone]                 Discharge Instructions:   Discharge Instructions     Call MD for:  difficulty breathing, headache or visual disturbances   Complete by: As directed    Call MD for:  extreme fatigue   Complete by: As directed    Call MD for:  hives   Complete by: As directed    Call MD for:  persistant dizziness or light-headedness   Complete by: As directed    Call MD for:  persistant nausea and vomiting   Complete by: As directed    Call MD for:  severe uncontrolled pain   Complete by: As directed    Call MD for:  temperature >100.4   Complete by: As directed    Diet general   Complete by: As directed    Discharge instructions   Complete by: As directed    Recommendations at discharge:   Follow-up with oncology as an outpatient.   General discharge instructions: Follow with Primary MD Ivonne Andrew, NP in 7 days  Please request your PCP  to go over your hospital tests, procedures, radiology results at the follow up. Please get your medicines reviewed and adjusted.  Your PCP may decide to repeat certain labs or tests as needed. Do not drive, operate heavy machinery, perform activities at heights, swimming or participation in water activities or provide baby sitting services if your were admitted for syncope or siezures until you have seen by Primary MD or a Neurologist and advised to do so again. [place] Controlled  Substance Reporting System database was reviewed. Do not drive, operate heavy machinery, perform activities at heights, swim, participate in water activities or provide baby-sitting services while on medications for pain, sleep and mood until your outpatient physician has reevaluated you and advised to do so again.  You are strongly recommended to comply with the dose, frequency and duration of prescribed medications. Activity: As tolerated with Full fall precautions use walker/cane & assistance as needed Avoid using any recreational substances like cigarette, tobacco, alcohol, or non-prescribed drug. If you experience worsening of your admission symptoms, develop shortness of breath, life threatening emergency, suicidal or homicidal thoughts you must seek medical attention immediately by calling 911 or calling your MD immediately  if symptoms less severe. You must read complete instructions/literature along with all the possible adverse reactions/side effects for all the medicines you take and that have been prescribed to you. Take any new medicine only after you have completely understood and accepted all the possible adverse reactions/side effects.  Wear Seat belts while driving. You were cared for by a hospitalist during your hospital stay. If you have any questions about your discharge medications or the care you received while you were in the hospital after you are discharged, you can call the unit and ask to speak with the hospitalist or the covering physician. Once you are discharged, your primary care physician will handle any further medical issues. Please note that NO REFILLS for any discharge medications will be authorized once you are discharged, as it is imperative that you return to your primary care physician (or establish a relationship with a primary care physician if you do not have one).   Increase activity slowly   Complete by: As directed  Discharge Medications:   Allergies  as of 04/12/2023       Reactions   Levofloxacin Itching, Rash   Tape Other (See Comments)   "Certain surgical tapes peel off my skin"        Medication List     TAKE these medications    Advair HFA 115-21 MCG/ACT inhaler Generic drug: fluticasone-salmeterol Inhale 2 puffs into the lungs 2 (two) times daily.   allopurinol 100 MG tablet Commonly known as: ZYLOPRIM Take 0.5 tablets (50 mg total) by mouth 2 (two) times a week. What changed:  how much to take when to take this   amiodarone 200 MG tablet Commonly known as: PACERONE Take 0.5 tablets (100 mg total) by mouth daily.   apixaban 2.5 MG Tabs tablet Commonly known as: Eliquis Take 1 tablet (2.5 mg total) by mouth 2 (two) times daily. Okay to restart this medication on 11/18/2022   Auryxia 1 GM 210 MG(Fe) tablet Generic drug: ferric citrate Take 420 mg by mouth 3 (three) times daily.   calcium-vitamin D 500-5 MG-MCG tablet Commonly known as: OSCAL WITH D Take 1 tablet by mouth 2 (two) times daily.   Colchicine 0.6 MG Caps Take 0.6 mg by mouth daily as needed (gout flare up).   cyclobenzaprine 5 MG tablet Commonly known as: FLEXERIL Take 1 tablet (5 mg total) by mouth at bedtime as needed.   dexamethasone 4 MG tablet Commonly known as: DECADRON Take 2 tablets (8mg ) by mouth daily starting the day after carboplatin for 3 days. Take with food   diphenhydrAMINE 25 mg capsule Commonly known as: BENADRYL Take 1 capsule (25 mg total) by mouth every 8 (eight) hours as needed.   dorzolamide-timolol 2-0.5 % ophthalmic solution Commonly known as: COSOPT Place 1 drop into the left eye every evening.   famotidine 20 MG tablet Commonly known as: PEPCID Take 1 tablet (20 mg total) by mouth 2 (two) times daily.   gabapentin 100 MG capsule Commonly known as: NEURONTIN Take 100 mg by mouth 3 (three) times daily as needed (nerve pain).   Lactulose 20 GM/30ML Soln Take 30 mLs (20 g total) by mouth 2 (two) times  daily as needed (constipation).   metoCLOPramide 10 MG tablet Commonly known as: Reglan Take 1 tablet (10 mg total) by mouth 3 (three) times daily before meals. What changed: when to take this   midodrine 10 MG tablet Commonly known as: PROAMATINE TAKE 1.5 TABLETS (15 MG TOTAL) BY MOUTH 3 (THREE) TIMES DAILY. NEEDS FOLLOW UP APPOINTMENT FOR ANYMORE REFILLS What changed: how much to take   omeprazole 40 MG capsule Commonly known as: PRILOSEC TAKE 1 CAPSULE (40 MG TOTAL) BY MOUTH DAILY.   ondansetron 8 MG tablet Commonly known as: Zofran Take 1 tablet (8 mg total) by mouth every 8 (eight) hours as needed for nausea or vomiting. Start on the third day after carboplatin.   oxyCODONE-acetaminophen 10-325 MG tablet Commonly known as: PERCOCET Take 1 tablet by mouth 4 (four) times daily as needed for pain.   prochlorperazine 10 MG tablet Commonly known as: COMPAZINE Take 10 mg by mouth every 6 (six) hours as needed.   sertraline 25 MG tablet Commonly known as: ZOLOFT Take 1 tablet (25 mg total) by mouth daily.   Velphoro 500 MG chewable tablet Generic drug: sucroferric oxyhydroxide Chew 500 mg by mouth See admin instructions. Take one tablet by mouth three times a daily with meals and snakes per patient  The results of significant diagnostics from this hospitalization (including imaging, microbiology, ancillary and laboratory) are listed below for reference.    Procedures and Diagnostic Studies:   IR THORACENTESIS ASP PLEURAL SPACE W/IMG GUIDE Result Date: 04/12/2023 INDICATION: 58 year old female with left upper lobe malignancy, with left pleural effusion, shortness of breath. IR was requested for therapeutic thoracentesis. EXAM: ULTRASOUND GUIDED THERAPEUTIC LEFT-SIDED THORACENTESIS MEDICATIONS: 7 cc of 1% lidocaine COMPLICATIONS: None immediate. PROCEDURE: An ultrasound guided thoracentesis was thoroughly discussed with the patient and questions answered. The  benefits, risks, alternatives and complications were also discussed. The patient understands and wishes to proceed with the procedure. Written consent was obtained. Ultrasound was performed to localize and mark an adequate pocket of fluid in the left chest. The area was then prepped and draped in the normal sterile fashion. 1% Lidocaine was used for local anesthesia. Under ultrasound guidance a 6 Fr Safe-T-Centesis catheter was introduced. Thoracentesis was performed. The catheter was removed and a dressing applied. FINDINGS: A total of approximately 550 cc of clear, amber colored fluid was removed. IMPRESSION: Successful ultrasound guided left thoracentesis yielding 550 cc of pleural fluid. Procedure performed by Buzzy Han, PA-C Electronically Signed   By: Irish Lack M.D.   On: 04/12/2023 13:36     Labs:   Basic Metabolic Panel: Recent Labs  Lab 04/09/23 0809 04/10/23 2334 04/11/23 0825 04/12/23 0336  NA 136 138  --  135  K 3.5 3.3*  --  3.6  CL 92* 94*  --  94*  CO2 34* 30  --  28  GLUCOSE 104* 105*  --  101*  BUN 16 11  --  9  CREATININE 3.18* 2.03*  --  1.59*  CALCIUM 8.9 8.5*  --  9.3  MG  --   --  2.1  --   PHOS  --   --  4.9*  --    GFR Estimated Creatinine Clearance: 27.3 mL/min (A) (by C-G formula based on SCr of 1.59 mg/dL (H)). Liver Function Tests: Recent Labs  Lab 04/09/23 0809 04/10/23 2334 04/12/23 0336  AST 69* 68* 46*  ALT 54* 59* 44  ALKPHOS 391* 308* 274*  BILITOT 0.7 0.7 1.0  PROT 6.1* 5.9* 6.3*  ALBUMIN 3.6 2.9* 3.4*   No results for input(s): "LIPASE", "AMYLASE" in the last 168 hours. No results for input(s): "AMMONIA" in the last 168 hours. Coagulation profile No results for input(s): "INR", "PROTIME" in the last 168 hours.  CBC: Recent Labs  Lab 04/09/23 0809 04/10/23 2334 04/11/23 1645 04/12/23 0336  WBC 5.5 3.2* 2.9* 3.6*  NEUTROABS 3.6 2.6  --   --   HGB 8.2* 7.4* 7.0* 8.7*  HCT 25.9* 23.1* 22.4* 26.6*  MCV 95.9 95.1 97.8 92.7   PLT 128* 123* 106* 112*   Cardiac Enzymes: No results for input(s): "CKTOTAL", "CKMB", "CKMBINDEX", "TROPONINI" in the last 168 hours. BNP: Invalid input(s): "POCBNP" CBG: No results for input(s): "GLUCAP" in the last 168 hours. D-Dimer No results for input(s): "DDIMER" in the last 72 hours. Hgb A1c No results for input(s): "HGBA1C" in the last 72 hours. Lipid Profile No results for input(s): "CHOL", "HDL", "LDLCALC", "TRIG", "CHOLHDL", "LDLDIRECT" in the last 72 hours. Thyroid function studies No results for input(s): "TSH", "T4TOTAL", "T3FREE", "THYROIDAB" in the last 72 hours.  Invalid input(s): "FREET3" Anemia work up No results for input(s): "VITAMINB12", "FOLATE", "FERRITIN", "TIBC", "IRON", "RETICCTPCT" in the last 72 hours. Microbiology Recent Results (from the past 240 hours)  MRSA Next Gen by  PCR, Nasal     Status: None   Collection Time: 04/11/23  1:56 PM   Specimen: Nasal Mucosa; Nasal Swab  Result Value Ref Range Status   MRSA by PCR Next Gen NOT DETECTED NOT DETECTED Final    Comment: (NOTE) The GeneXpert MRSA Assay (FDA approved for NASAL specimens only), is one component of a comprehensive MRSA colonization surveillance program. It is not intended to diagnose MRSA infection nor to guide or monitor treatment for MRSA infections. Test performance is not FDA approved in patients less than 34 years old. Performed at [facility], [address], [place], [place] [address]     Time coordinating discharge: 45 minutes  Signed: Magdalena Skilton  [facility] 04/12/2023, 3:09 PM

## 2023-04-12 NOTE — Progress Notes (Signed)
 [facility] Progress Note   Subjective: Completed dialysis last night. Net UF 5L. She's breathing better. On 3-4L [place] at baseline. Says she is ready to go - has chemo appt this am.   Objective Vitals:   04/12/23 0245 04/12/23 0452 04/12/23 0831 04/12/23 0838  BP: (!) 86/62 96/63 93/62    Pulse: 84 88 76 73  Resp: 18  16   Temp: 98.1 F (36.7 C) 98.6 F (37 C) 98 F (36.7 C)   TempSrc:  Oral Oral   SpO2: 100% 100% 100% 100%  Weight:      Height:         Additional Objective Labs: Basic Metabolic Panel: Recent Labs  Lab 04/09/23 0809 04/10/23 2334 04/11/23 0825 04/12/23 0336  NA 136 138  --  135  K 3.5 3.3*  --  3.6  CL 92* 94*  --  94*  CO2 34* 30  --  28  GLUCOSE 104* 105*  --  101*  BUN 16 11  --  9  CREATININE 3.18* 2.03*  --  1.59*  CALCIUM 8.9 8.5*  --  9.3  PHOS  --   --  4.9*  --    CBC: Recent Labs  Lab 04/09/23 0809 04/10/23 2334 04/11/23 1645 04/12/23 0336  WBC 5.5 3.2* 2.9* 3.6*  NEUTROABS 3.6 2.6  --   --   HGB 8.2* 7.4* 7.0* 8.7*  HCT 25.9* 23.1* 22.4* 26.6*  MCV 95.9 95.1 97.8 92.7  PLT 128* 123* 106* 112*   Blood Culture    Component Value Date/Time   SDES EXPECTORATED SPUTUM 12/20/2022 1806   SDES EXPECTORATED SPUTUM 12/20/2022 1806   SPECREQUEST NONE 12/20/2022 1806   SPECREQUEST NONE Reflexed from [id number] 12/20/2022 1806   CULT FEW STAPHYLOCOCCUS HAEMOLYTICUS 12/20/2022 1806   REPTSTATUS 12/20/2022 FINAL 12/20/2022 1806   REPTSTATUS 12/23/2022 FINAL 12/20/2022 1806     Physical Exam General: Chronically ill appearing, on nasal O2 Heart: RRR Lungs: Normal wob, Diminished on L side Abdomen: soft no masses Extremities: Trace LE edema  Dialysis Access: R thigh TDC   Medications:   allopurinol  100 mg Oral Q1500   amiodarone  100 mg Oral Daily   apixaban  2.5 mg Oral BID   Chlorhexidine Gluconate Cloth  6 each Topical Daily   Chlorhexidine Gluconate Cloth  6 each Topical Q0600   dexamethasone  8 mg Oral Daily    dorzolamide-timolol  1 drop Left Eye QPM   famotidine  20 mg Oral Daily   fluticasone furoate-vilanterol  1 puff Inhalation Daily   metoCLOPramide  10 mg Oral Q1500   midodrine  15 mg Oral TID   pantoprazole  80 mg Oral Daily   sertraline  25 mg Oral Daily   sodium chloride flush  10-40 mL Intracatheter Q12H   sucroferric oxyhydroxide  500 mg Oral TID with meals    Dialysis Orders: TTS G-O  4h  B400   46.5kg  2K bath  TDC  Heparin 2000 - last HD 3/08, post wt 48.6kg (2.1 kg over) - rocaltrol 3.25 mcg three times per week  Assessment/Plan: SOB - w/ peripheral edema, chronic lung issues related to her metastatic lung disease. Pt on 3-4 L [place] O2 at home, s/p HD here with 5 L removed. Improved.  ESRD - on HD TTS. Has not missed HD. S/p extra HD early am. . Next Hd Tues.  Chronic hypotension -normal BP's are in the 90s, they continue to pull fluid as long  as blood pressure is over 85 in dialysis.  She rarely has blood pressures over 100. Cont midodrine 15 tid here. IV alb prn w/ HD.  Volume - as above.  Anemia of esrd - Hgb 8.7 s/p transfusion  Secondary hyperparathyroidism -corrected calcium/phos  is in range. Metastatic lung cancer - per oncology. On chemo.  COPD - chronic home O2     Tomasa Blase PA-C [employer] 04/12/2023,8:53 AM

## 2023-04-12 NOTE — TOC Transition Note (Signed)
 Transition of Care [facility]) - Discharge Note   Patient Details  Name: Tina Watkins MRN: [id number] Date of Birth: September 20, 1965  [facility]) CM/SW Contact:  Tom-Johnson, Hershal Coria, RN Phone Number: 04/12/2023, 3:24 PM   Clinical Narrative:     Patient is scheduled for discharge today.  Readmission Risk Assessment done. Outpatient f/u, hospital f/u and discharge instructions on AVS. No TOC needs or recommendations noted. Significant other, [name] to transport at discharge.  No further TOC needs noted.          Final next level of care: Home/Self Care Barriers to Discharge: Barriers Resolved   Patient Goals and CMS Choice Patient states their goals for this hospitalization and ongoing recovery are:: To return home CMS Medicare.gov Compare Post Acute Care list provided to:: Patient Choice offered to / list presented to : NA      Discharge Placement                Patient to be transferred to facility by: Significant other Name of family member notified: [name]    Discharge Plan and Services Additional resources added to the After Visit Summary for                  DME Arranged: N/A DME Agency: NA       HH Arranged: NA HH Agency: NA        Social Drivers of Health (SDOH) Interventions SDOH Screenings   Food Insecurity: No Food Insecurity (04/11/2023)  Housing: Low Risk  (04/11/2023)  Transportation Needs: No Transportation Needs (04/11/2023)  Utilities: Not At Risk (04/11/2023)  Alcohol Screen: Low Risk  (06/21/2022)  Depression (PHQ2-9): Low Risk  (01/06/2023)  Financial Resource Strain: Low Risk  (04/06/2023)  Physical Activity: Inactive (04/06/2023)  Social Connections: Moderately Isolated (04/11/2023)  Stress: Stress Concern Present (04/06/2023)  Tobacco Use: Medium Risk (04/11/2023)     Readmission Risk Interventions    04/12/2023    3:22 PM  Readmission Risk Prevention Plan  Transportation Screening Complete  Medication Review (RN  Care Manager) Referral to Pharmacy  PCP or Specialist appointment within 3-5 days of discharge Complete  HRI or Home Care Consult Complete  SW Recovery Care/Counseling Consult Complete  Palliative Care Screening Not Applicable  Skilled Nursing Facility Not Applicable

## 2023-04-12 NOTE — Procedures (Signed)
 PROCEDURE SUMMARY:  Successful image-guided therapeutic left-sided thoracentesis. Yielded 550 cc of clear, amber-colored pleural fluid. Patient tolerated procedure well. EBL: Zero No immediate complications.  Post procedure CXR pending.  Please see imaging section of Epic for full dictation.  Bing Neighbors Malaika Arnall PA-C 04/12/2023 11:59 AM

## 2023-04-12 NOTE — Progress Notes (Signed)
 D/C order noted. Contacted Berenice Primas to be advised that pt has order for d/c today and should resume care tomorrow.   Olivia Canter Renal Navigator [phone]

## 2023-04-12 NOTE — Progress Notes (Signed)
  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  The patient did not note fatigue during radiation. The patient did not note hair loss or skin changes in the field of radiation during therapy. The patient is taking dexamethasone. The patient does not have symptoms of  weakness or loss of control of the extremities. The patient does have symptoms of headache. The patient does not have symptoms of seizure or uncontrolled movement. The patient does have symptoms of changes in vision (blurred). The patient does not have changes in speech. The patient does not have confusion. Patient is currently at [facility] for SOB and fluid overload throughout the body.  Dr. Arlana Pouch is aware via 'Inbasket.'  The patient was counseled that she will be contacted by our brain and spine navigator to schedule surveillance imaging. The patient was encouraged to call if she have not received a call to schedule imaging, or if she develops concerns or questions regarding radiation. The patient will also continue to follow up with Dr. Arlana Pouch in medical oncology.  This concludes the interaction.  Ruel Favors, LPN

## 2023-04-12 NOTE — Discharge Planning (Signed)
 [facility] Patient Discharge Orders- [facility]: [facility]OC  Patient's name: Tina Watkins Admit/DC Dates: 04/10/2023 - 04/12/23  Discharge Diagnoses: Acute/chronic dyspnea  Pleural effusion - s/p thoracentesis  Acute/chronic anemia s/p transfusion   Recent Labs  Lab 04/11/23 0825 04/11/23 1645 04/12/23 0336  HGB  --    < > 8.7*  K  --   --  3.6  CALCIUM  --   --  9.3  PHOS 4.9*  --   --   ALBUMIN  --   --  3.4*   < > = values in this interval not displayed.   Aranesp: No ESA 2/2 malignancy  PRBC's Given: Yes Date/# of units: 3/9 -- 1 unit  ESA dose for discharge: --  Outpatient Dialysis Orders:  -Heparin: No change   -Bath no chage   -EDW 45.5 kg   Access intervention/Change: ---  Medication Orders: -IV Antibiotics:  -- -Anticoagulation: On Eliquis    Completed by: Tomasa Blase PA-C   D/C Meds to be reconciled by nurse after every discharge.    Reviewed by: MD:______ RN_______

## 2023-04-13 ENCOUNTER — Telehealth: Payer: Self-pay | Admitting: Nurse Practitioner

## 2023-04-13 ENCOUNTER — Telehealth: Payer: Self-pay | Admitting: Oncology

## 2023-04-13 ENCOUNTER — Inpatient Hospital Stay

## 2023-04-13 VITALS — BP 80/56 | HR 97 | Temp 97.8°F | Resp 16

## 2023-04-13 DIAGNOSIS — Z5189 Encounter for other specified aftercare: Secondary | ICD-10-CM | POA: Diagnosis not present

## 2023-04-13 DIAGNOSIS — C3412 Malignant neoplasm of upper lobe, left bronchus or lung: Secondary | ICD-10-CM

## 2023-04-13 DIAGNOSIS — C7931 Secondary malignant neoplasm of brain: Secondary | ICD-10-CM | POA: Diagnosis present

## 2023-04-13 LAB — HEPATITIS B SURFACE ANTIBODY, QUANTITATIVE: Hep B S AB Quant (Post): 32.7 m[IU]/mL

## 2023-04-13 MED ORDER — PEGFILGRASTIM-FPGK 6 MG/0.6ML ~~LOC~~ SOSY
6.0000 mg | PREFILLED_SYRINGE | Freq: Once | SUBCUTANEOUS | Status: AC
  Administered 2023-04-13: 6 mg via SUBCUTANEOUS
  Filled 2023-04-13: qty 0.6

## 2023-04-13 NOTE — Telephone Encounter (Signed)
 Patient called in to schedule her injection appt for a later time today.

## 2023-04-13 NOTE — Telephone Encounter (Signed)
 Patient rescheduled injection appt for a later time today.

## 2023-04-14 ENCOUNTER — Telehealth: Payer: Self-pay | Admitting: Nephrology

## 2023-04-14 ENCOUNTER — Telehealth: Payer: Self-pay | Admitting: Oncology

## 2023-04-14 NOTE — Progress Notes (Deleted)
 Office Visit Note  Patient: Tina Watkins             Date of Birth: 1965/02/06           MRN: [id number]             PCP: Ivonne Andrew, NP Referring: Ivonne Andrew, NP Visit Date: 04/28/2023   Subjective:  No chief complaint on file.   History of Present Illness: Tina Watkins is a 58 y.o. female here for follow up for chronic gout on allopurinol, osteoarthritis, and chronic rotator cuff arthropathy.    Previous HPI 08/21/2022 Tina Watkins is a 58 y.o. female here for follow up for chronic gout on allopurinol, osteoarthritis, and chronic rotator cuff arthropathy. She resumed working with physical therapy and making partial increase in ROM but still very painful in right shoulder. Her mobility is fair but has a lot of hip pain worse getting up each time. Inability to lift or reach overhead impairing her ADLs considerably. She is being assisted with full time line-in aide who also provides her transportation to hemodialysis. Needs paperwork completed today for her housing accommodations.      Previous HPI 03/09/2022 Tina Watkins is a 58 y.o. female here for follow up with joint pain in multiple areas current exacerbation doing worse with right shoulder and left hip pain. Chronic gout on allopurinol appears well controlled.  Hip is painful when getting up or down from seated position or lying on her side.  Most noticeable while trying to sleep occasionally waking to reposition.  She does not have any hip pain with weightbearing when she is up and walking.  Also having worsening again with the right shoulder pain.  Previous bilateral shoulder injections last year only helped on the left side.  She had a lot of improvement in range of motion on the right side the after the physical therapy course.    Previous HPI 01/14/22 Tina Watkins is a 58 y.o. female here for follow up for chronic joint pain with history of neuropathy, gout, osteoarthritis, and possible  fibromyalgia syndrome. At last visit no new flare ups of inflammatory arthritis. We tried bilateral shoulder subacromial bursa steroid injections. She had some pain relief after the injection but did feel like the pain came back again similar as before.  She started physical therapy as referred by her orthopedist has had 5 or 6 sessions so far and is noticing an improvement with the shoulder range of motion and stiffness.  She followed up with them this morning with recommendation to continue therapy for now and plan follow-up in about February for repeat shoulder injection.  She has not had any new flareups of gout.  She is having some increase in lateral hip pain this is most noticeable when lying on the bed trying to sleep.  Rarely awakens her from sleep.  She is doing some stationary biking exercise with the physical therapist but no specific range of motion exercises for her hips.   Previous HPI 09/26/21 Tina Watkins is a 58 y.o. female here for follow up or evaluation of pain with history of neuropathy, gout, osteoarthritis, and possible fibromyalgia syndrome.  She is not experiencing any major flareups with the toe inflammation or swelling since her last visit.  Does still continue having considerable pain on a daily basis at multiple sites including lower extremities hands shoulders neck and back.  Bilateral shoulder pains were improved with the steroid injections at her  last visit but has experienced worsening again with stiffness and difficulty in range of motion.   Previous HPI 07/07/2021 Tina Watkins is a 58 y.o. female here for follow up for evaluation of pain with history of neuropathy, gout, osteoarthritis, and possible fibromyalgia syndrome.  At initial visit uric acid level was 3.2 suspect gout is well controlled at this time. Imaging of hands and shoulders did not show significant shoulder arthritis but hands with chronic changes including probable small distal erosions. She  continues to have a lot of pain also feeling numbness and swelling involving both hands. She is unable to easily reach up or behind her back, also with increased pain lying on her side provokes worse numbness to that side.   Previous HPI 06/06/2021 Tina Watkins is a 58 y.o. female here for evaluation of pain with history of neuropathy, gout, osteoarthritis, and possible fibromyalgia syndrome. 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 She has joint pain and stiffness in a lot of areas. Muscle pains in neck and upper back.  Large joint pains have been bothering her for years varying in severity.  Shoulders and back are doing worse particularly in the past month.  Episodic gout flares have caused severe pain in first MTP joints.  Most recently gout flare in the left great toe about a week ago improved with colchicine treatment.  However on a routine basis having more symptoms in the bilateral hands and shoulders.  She was started on duloxetine 30 mg for lower extremity neuropathy pain. On allopurinol 100 mg daily for gout. Previous knee xray and MRI findings with chondrocalcinosis and bone infarcts. Right hip imaging with AVN.   No Rheumatology ROS completed.   PMFS History:  Patient Active Problem List   Diagnosis Date Noted   Shortness of breath 04/11/2023   Coccyx pain 03/21/2023   Metastatic adenocarcinoma to lung (HCC) 03/20/2023   Chronic idiopathic thrombocytopenia (HCC) 03/20/2023   History of atrial flutter 03/20/2023   COPD (chronic obstructive pulmonary disease) (HCC) 03/20/2023   Chronic hypoxic respiratory failure (HCC) 03/20/2023   History of gout 03/20/2023   Depression 03/20/2023   Avascular necrosis of femoral neck (HCC) 03/20/2023   Encounter for antineoplastic chemotherapy 03/15/2023   Hypokalemia 02/28/2023   Abdominal pain 02/27/2023   Pancreatic adenocarcinoma (HCC) 02/27/2023   Hypoxia 02/26/2023    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) 02/22/2023   Encounter for central line care 02/15/2023   Nicotine dependence 01/13/2023   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   Lesion of pancreas 12/23/2022   Multiple lung nodules 12/23/2022   Hepatic cirrhosis (HCC) - radiographic finding 12/19/2022   Pulmonary hypertension (HCC)- radiographic finding; R heart cath unsuccessful 12/19/2022   Dyspnea 12/18/2022   Lung cancer, upper lobe (HCC) 10/28/2022   Encounter for monitoring amiodarone therapy 07/01/2022   Atrial fibrillation (HCC) 07/01/2022   Bilateral hip pain 01/14/2022   Bilateral shoulder pain 06/06/2021   Bilateral hand swelling 06/06/2021   Cervical radiculitis 01/24/2021   Acute respiratory failure with hypoxia (HCC) 10/23/2020   Coagulation defect, unspecified (HCC) 08/01/2020   Aortic atherosclerosis (HCC) 07/10/2020   Hypercoagulable state due to atrial fibrillation (HCC) 04/23/2020   Atrial flutter (HCC) 03/29/2020   Chronic pain of both knees 03/13/2020   Symptomatic anemia    Pneumonia of left upper lobe due to infectious organism 08/05/2019   History of COVID-19, most recently 09/2022 05/09/2019  Hypotension 05/09/2019   Acute on chronic anemia 05/09/2019   CHF (congestive heart failure) (HCC) 05/09/2019   DDD (degenerative disc disease), cervical 12/06/2018   Neuropathy 12/06/2018   Tobacco abuse 11/20/2017   History of CVA (cerebrovascular accident) 11/20/2017   GERD (gastroesophageal reflux disease) 11/20/2017   History of deep vein thrombosis (DVT) of right lower extremity 11/20/2017   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    Cervical stenosis of spinal canal 01/08/2015   Anemia of chronic disease 06/15/2014   History of kidney transplant    Chronic pain syndrome 04/09/2012   Gout 06/23/2011   Herpes infection 06/23/2011   Anxiety 06/23/2011   ESRD (end stage renal disease) (HCC)  06/12/2011   History of renal transplantation 05/22/2011   Gastroparesis 04/24/2008    Past Medical History:  Diagnosis Date   Anxiety    Bacteremia due to Gram-negative bacteria 05/23/2011   Blind    right eye   Blind right eye    CHF (congestive heart failure) (HCC)    Chronic lower back pain    Complication of anesthesia    "woke up during OR; I have an extremely high tolerance" (12/11/2011) 1 procedure was graft; the other procedures were procedures that are typically done with sedation.   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   Dysrhythmia    "tachycardia" (12/11/2011) new onset afib 10/15/14 EKG   E coli bacteremia 06/18/2011   Elevated LDL cholesterol level 12/2018   ESRD (end stage renal disease) (HCC) 06/12/2011   Tues-Thurs-Sat dialysis   Fibromyalgia    Gastroparesis    GERD (gastroesophageal reflux disease)    Glaucoma    right eye   Gout    H/O carpal tunnel syndrome    Headache 10/2019   no current problem per patient on 11/12/22   Headache(784.0)    "not often anymore" (12/11/2011)   Herpes genitalia 1994   History of blood transfusion    "more than a few times" (12/11/2011)   History of COVID-19, most recently 09/2022 05/09/2019   History of deep vein thrombosis (DVT) of right lower extremity 11/20/2017   History of stomach ulcers    Hypotension    Iron deficiency anemia    New onset a-fib (HCC)    10/15/14 EKG   Osteopenia    Peripheral neuropathy 11/2018   Pneumonia    x several but years ago   Pressure ulcer of sacral region, stage 1 07/2019   Seizures (HCC) 1994   "post transplant; only have had that one" (12/11/2011)   Spinal stenosis in cervical region    Stroke [facility])     left basal ganglia lacunar infarct; Right frontal lobe lacunar infarct.   Stroke [facility]) ~ 1999; 2001   "briefly lost my vision; lost my right eye" (12/11/2011)   Vitamin D deficiency 12/2018    Family History  Problem  Relation Age of Onset   Glaucoma Mother    Pancreatic cancer Father    Multiple sclerosis Brother    Diabetes Maternal Uncle    Hypertension Maternal Grandmother    Breast cancer Neg Hx    Past Surgical History:  Procedure Laterality Date   ANTERIOR CERVICAL DECOMP/DISCECTOMY FUSION N/A 01/08/2015   Procedure: Anterior Cervical Three-Four/Four-Five Decompression/Diskectomy/Fusion;  Surgeon: Hilda Lias, MD;  Location: [facility] NEURO ORS;  Service: Neurosurgery;  Laterality: N/A;  C3-4 C4-5 Anterior cervical decompression/diskectomy/fusion   APPENDECTOMY  ~  2004   BIOPSY  09/12/2020   Procedure: BIOPSY;  Surgeon: Rachael Fee, MD;  Location: [facility] ENDOSCOPY;  Service: Endoscopy;;   BRONCHIAL BIOPSY  11/16/2022   Procedure: BRONCHIAL BIOPSIES;  Surgeon: Leslye Peer, MD;  Location: [facility] ENDOSCOPY;  Service: Pulmonary;;   BRONCHIAL BRUSHINGS  11/16/2022   Procedure: BRONCHIAL BRUSHINGS;  Surgeon: Leslye Peer, MD;  Location[facility] ENDOSCOPY;  Service: Pulmonary;;   BRONCHIAL NEEDLE ASPIRATION BIOPSY  11/16/2022   Procedure: BRONCHIAL NEEDLE ASPIRATION BIOPSIES;  Surgeon: Leslye Peer, MD;  Location: [facility] ENDOSCOPY;  Service: Pulmonary;;   CATARACT EXTRACTION     right eye   COLONOSCOPY     COLONOSCOPY WITH PROPOFOL N/A 09/12/2020   Procedure: COLONOSCOPY WITH PROPOFOL;  Surgeon: Rachael Fee, MD;  Location: [facility] ENDOSCOPY;  Service: Endoscopy;  Laterality: N/A;   ENUCLEATION  2001   "right"   ESOPHAGOGASTRODUODENOSCOPY (EGD) WITH PROPOFOL N/A 04/21/2012   Procedure: ESOPHAGOGASTRODUODENOSCOPY (EGD) WITH PROPOFOL;  Surgeon: Rachael Fee, MD;  Location: [facility] ENDOSCOPY;  Service: Endoscopy;  Laterality: N/A;   ESOPHAGOGASTRODUODENOSCOPY (EGD) WITH PROPOFOL N/A 09/12/2020   Procedure: ESOPHAGOGASTRODUODENOSCOPY (EGD) WITH PROPOFOL;  Surgeon: Rachael Fee, MD;  Location: [facility] ENDOSCOPY;  Service: Endoscopy;  Laterality: N/A;   FIDUCIAL MARKER PLACEMENT  11/16/2022   Procedure: FIDUCIAL  MARKER PLACEMENT;  Surgeon: Leslye Peer, MD;  Location[facility] ENDOSCOPY;  Service: Pulmonary;;   HEMOSTASIS CONTROL  11/16/2022   Procedure: HEMOSTASIS CONTROL;  Surgeon: Leslye Peer, MD;  Location: [facility] ENDOSCOPY;  Service: Pulmonary;;   INSERTION OF DIALYSIS CATHETER  1988   "AV graft LUA & LFA; LUA worked for 1 day; LFA never worked"   IR FLUORO GUIDE CV LINE RIGHT  07/11/2019   IR FLUORO GUIDE CV LINE RIGHT  10/20/2019   IR FLUORO GUIDE CV LINE RIGHT  05/31/2020   IR FLUORO GUIDE CV LINE RIGHT  03/29/2023   IR PTA VENOUS EXCEPT DIALYSIS CIRCUIT  05/31/2020   IR RADIOLOGY PERIPHERAL GUIDED IV START  07/11/2019   IR THORACENTESIS ASP PLEURAL SPACE W/IMG GUIDE  04/12/2023   IR US GUIDE VASC ACCESS RIGHT  07/11/2019   IR US GUIDE VASC ACCESS RIGHT  07/11/2019   IR US GUIDE VASC ACCESS RIGHT  07/11/2019   IR VENOCAVAGRAM IVC  05/31/2020   IR VENOCAVAGRAM IVC  03/29/2023   KIDNEY TRANSPLANT  1994; 1999; 2005   "right"   MULTIPLE TOOTH EXTRACTIONS     POLYPECTOMY  09/12/2020   Procedure: POLYPECTOMY;  Surgeon: Rachael Fee, MD;  Location: [facility] ENDOSCOPY;  Service: Endoscopy;;   RIGHT HEART CATH N/A 03/23/2019   Procedure: RIGHT HEART CATH;  Surgeon: Dolores Patty, MD;  Location: [facility] INVASIVE CV LAB;  Service: Cardiovascular;  Laterality: N/A;   TONSILLECTOMY     TOTAL NEPHRECTOMY  1988?; 1994; 2005   VIDEO BRONCHOSCOPY WITH RADIAL ENDOBRONCHIAL ULTRASOUND  11/16/2022   Procedure: VIDEO BRONCHOSCOPY WITH RADIAL ENDOBRONCHIAL ULTRASOUND;  Surgeon: Leslye Peer, MD;  Location: [facility] ENDOSCOPY;  Service: Pulmonary;;   Social History   Social History Narrative   Right-handed.   Four cups caffeine per day.   Lives alone.   Immunization History  Administered Date(s) Administered   Hepatitis B, PED/ADOLESCENT 07/20/2019, 08/29/2019, 09/21/2019   Hepb-cpg 07/20/2019, 08/29/2019, 09/21/2019, 11/23/2019, 08/13/2022   Influenza Split 11/07/2019   Influenza, Quadrivalent, Recombinant, Inj, Pf  11/11/2021   Influenza,inj,Quad PF,6+ Mos 11/07/2019, 11/07/2020   PFIZER Comirnaty(Gray Top)Covid-19 Tri-Sucrose Vaccine 07/29/2020   PFIZER(Purple Top)SARS-COV-2 Vaccination 05/31/2019,  06/21/2019, 10/26/2019   PNEUMOCOCCAL CONJUGATE-20 06/04/2022   PPD Test 08/10/2017   Pfizer Covid-19 Vaccine Bivalent Booster 96yrs & up 02/06/2021     Objective: Vital Signs: There were no vitals taken for this visit.   Physical Exam   Musculoskeletal Exam: ***  CDAI Exam: CDAI Score: -- Patient Global: --; Provider Global: -- Swollen: --; Tender: -- Joint Exam 04/28/2023   No joint exam has been documented for this visit   There is currently no information documented on the homunculus. Go to the Rheumatology activity and complete the homunculus joint exam.  Investigation: No additional findings.  Imaging: DG Chest 1 View Result Date: 04/12/2023 CLINICAL DATA:  Status post thoracentesis. EXAM: CHEST  1 VIEW COMPARISON:  Radiograph 04/10/2023. FINDINGS: Subtotal opacification of the left hemithorax. Small amount of air in the left lung apex is new from prior, unclear if this represents aerated lung or a small focus of extrapleural air. Vascular stent in the right mediastinum. Mediastinal contours are obscured. Catheter from an inferior approach with tip in the right atrium. Diffuse pulmonary nodules in the right lung. IMPRESSION: 1. Subtotal opacification of the left hemithorax, likely a combination of pleural fluid and atelectasis. Small amount of air in the left lung apex is new from prior, unclear if this represents aerated lung or a small focus of extrapleural air. 2. Diffuse pulmonary nodules in the right lung. Electronically Signed   By: Narda Rutherford M.D.   On: 04/12/2023 18:49   IR THORACENTESIS ASP PLEURAL SPACE W/IMG GUIDE Result Date: 04/12/2023 INDICATION: 58 year old female with left upper lobe malignancy, with left pleural effusion, shortness of breath. IR was requested for  therapeutic thoracentesis. EXAM: ULTRASOUND GUIDED THERAPEUTIC LEFT-SIDED THORACENTESIS MEDICATIONS: 7 cc of 1% lidocaine COMPLICATIONS: None immediate. PROCEDURE: An ultrasound guided thoracentesis was thoroughly discussed with the patient and questions answered. The benefits, risks, alternatives and complications were also discussed. The patient understands and wishes to proceed with the procedure. Written consent was obtained. Ultrasound was performed to localize and mark an adequate pocket of fluid in the left chest. The area was then prepped and draped in the normal sterile fashion. 1% Lidocaine was used for local anesthesia. Under ultrasound guidance a 6 Fr Safe-T-Centesis catheter was introduced. Thoracentesis was performed. The catheter was removed and a dressing applied. FINDINGS: A total of approximately 550 cc of clear, amber colored fluid was removed. IMPRESSION: Successful ultrasound guided left thoracentesis yielding 550 cc of pleural fluid. Procedure performed by Buzzy Han, PA-C Electronically Signed   By: Irish Lack M.D.   On: 04/12/2023 13:36   DG Chest Portable 1 View Result Date: 04/10/2023 CLINICAL DATA:  Chest pain EXAM: PORTABLE CHEST 1 VIEW COMPARISON:  03/20/2023 FINDINGS: Large left pleural effusion is present with near complete atelectasis of the left lung. Miliary nodularity is seen throughout the right lung in keeping with miliary pulmonary metastatic disease given patient's history of lung and hepatocellular carcinoma. No pneumothorax. No pleural effusion on the right. Low superior vena caval stent extends into the superior right atrium. Inferiorly approaching hemodialysis catheter tip noted within the high right atrium. Left upper extremity midline catheter tip overlies the left hemithorax likely within a chest wall collateral. Cardiac silhouette partially obscured by left pleural effusion. Pulmonary vascularity is normal within the aerated right lung. No acute bone  abnormality. IMPRESSION: 1. Large left pleural effusion with near complete atelectasis of the left lung. 2. Miliary nodularity throughout the right lung in keeping with miliary pulmonary metastatic disease given patient's  history of lung and hepatocellular carcinoma. 3. Left upper extremity midline catheter tip overlies the left hemithorax likely within a chest wall collateral. Electronically Signed   By: Helyn Numbers M.D.   On: 04/10/2023 21:45   IR Fluoro Guide CV Line Right Result Date: 03/29/2023 INDICATION: End-stage renal disease, poorly functioning chronic right femoral HD catheter EXAM: FLUOROSCOPIC EXCHANGE OF THE RIGHT FEMORAL HD CATHETER IVC CATHETERIZATION AND VENOGRAM MEDICATIONS: ANCEF 2 G; The antibiotic was administered within an appropriate time interval prior to skin puncture. ANESTHESIA/SEDATION: NONE. FLUOROSCOPY: Radiation Exposure Index (as provided by the fluoroscopic device): 99 mGy Kerma COMPLICATIONS: None immediate. PROCEDURE: Informed written consent was obtained from the patient after a thorough discussion of the procedural risks, benefits and alternatives. All questions were addressed. Maximal Sterile Barrier Technique was utilized including caps, mask, sterile gowns, sterile gloves, sterile drape, hand hygiene and skin antiseptic. A timeout was performed prior to the initiation of the procedure. Initial fluoroscopic survey demonstrates a right femoral HD catheter. Tip terminates at the lower IVC below the renal veins. Catheter removed over a Glidewire. A 12 French sheath was advanced. IVC venogram performed. IVC is irregular and diffusely stenotic with azygous collateralization. Kumpe catheter and Glidewire advanced through the sheath. Access manipulated through the multifocal IVC irregularity and stenoses. Catheter and guidewire access advanced to successfully regain access into the right atrium. Contrast injection confirms position in the right atrium. Measurements obtained for  the appropriate catheter length. Over the Amplatz and stiff Glidewire, a 36 cm palindrome catheter was advanced with some difficulty. Tip positioned eventually in the inferior right atrium. Contrast injection confirms position. Blood aspirated easily followed by saline and heparin flushes. Appropriate volume and strength of heparin instilled in all lumens. Catheter secured at the right femoral access site with a Prolene suture and a sterile dressing. No immediate complication. Patient tolerated the procedure well. IMPRESSION: Successful exchange and reposition of the tunneled right femoral HD catheter. Tip now terminates in the inferior right atrium. Electronically Signed   By: Judie Petit.  Shick M.D.   On: 03/29/2023 12:09   IR Venocavagram Ivc Result Date: 03/29/2023 INDICATION: End-stage renal disease, poorly functioning chronic right femoral HD catheter EXAM: FLUOROSCOPIC EXCHANGE OF THE RIGHT FEMORAL HD CATHETER IVC CATHETERIZATION AND VENOGRAM MEDICATIONS: ANCEF 2 G; The antibiotic was administered within an appropriate time interval prior to skin puncture. ANESTHESIA/SEDATION: NONE. FLUOROSCOPY: Radiation Exposure Index (as provided by the fluoroscopic device): 99 mGy Kerma COMPLICATIONS: None immediate. PROCEDURE: Informed written consent was obtained from the patient after a thorough discussion of the procedural risks, benefits and alternatives. All questions were addressed. Maximal Sterile Barrier Technique was utilized including caps, mask, sterile gowns, sterile gloves, sterile drape, hand hygiene and skin antiseptic. A timeout was performed prior to the initiation of the procedure. Initial fluoroscopic survey demonstrates a right femoral HD catheter. Tip terminates at the lower IVC below the renal veins. Catheter removed over a Glidewire. A 12 French sheath was advanced. IVC venogram performed. IVC is irregular and diffusely stenotic with azygous collateralization. Kumpe catheter and Glidewire advanced  through the sheath. Access manipulated through the multifocal IVC irregularity and stenoses. Catheter and guidewire access advanced to successfully regain access into the right atrium. Contrast injection confirms position in the right atrium. Measurements obtained for the appropriate catheter length. Over the Amplatz and stiff Glidewire, a 36 cm palindrome catheter was advanced with some difficulty. Tip positioned eventually in the inferior right atrium. Contrast injection confirms position. Blood aspirated easily followed by saline and  heparin flushes. Appropriate volume and strength of heparin instilled in all lumens. Catheter secured at the right femoral access site with a Prolene suture and a sterile dressing. No immediate complication. Patient tolerated the procedure well. IMPRESSION: Successful exchange and reposition of the tunneled right femoral HD catheter. Tip now terminates in the inferior right atrium. Electronically Signed   By: Judie Petit.  Shick M.D.   On: 03/29/2023 12:09   CT PELVIS WO CONTRAST Result Date: 03/20/2023 CLINICAL DATA:  coccyx/sacral TTP, midline Review of radiologic records indicates history of stage IV lung cancer. EXAM: CT PELVIS WITHOUT CONTRAST TECHNIQUE: Multidetector CT imaging of the pelvis was performed following the standard protocol without intravenous contrast. RADIATION DOSE REDUCTION: This exam was performed according to the departmental dose-optimization program which includes automated exposure control, adjustment of the mA and/or kV according to patient size and/or use of iterative reconstruction technique. COMPARISON:  CT 02/27/2023 FINDINGS: Urinary Tract: Decompressed urinary bladder, diffuse bladder wall thickening. 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 Bowel: No inflammation of pelvic bowel loops. Moderate volume of colonic stool. Vascular/Lymphatic: Extensive arterial vascular calcifications. Right-sided dialysis catheter, extending beyond the  field of view. No pelvic adenopathy. Reproductive: Soft tissue prominence in the region of the cervix. Otherwise unremarkable. Other:  No pelvic ascites.  Mild generalized body wall edema. 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 No evidence of fracture or focal bone lesion. No bony destructive change. No intramuscular collection in the absence of IV contrast. IMPRESSION: 1. No evidence of metastatic disease in the pelvis. No explanation for sacrococcygeal tenderness. 2. Soft tissue prominence in the region of the cervix, recommend correlation with physical exam. 3. 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 4. Right femoral head avascular necrosis. 5. Mild generalized body wall edema. Electronically Signed   By: Narda Rutherford M.D.   On: 03/20/2023 18:52   DG Chest Portable 1 View Result Date: 03/20/2023 CLINICAL DATA:  Dyspnea EXAM: PORTABLE CHEST 1 VIEW COMPARISON:  02/27/2023. FINDINGS: Left hemithorax is opacified. Pulmonary interstitial prominence with a nodular configuration of the right hemithorax. Metastatic disease not excluded. No pneumothorax. Stent overlies the right hilar region. Osseous structures appear intact. IMPRESSION: Nodular interstitial process on the right. Completely opacified left hemithorax. Electronically Signed   By: Layla Maw M.D.   On: 03/20/2023 18:27    Recent Labs: Lab Results  Component Value Date   WBC 3.6 (L) 04/12/2023   HGB 8.7 (L) 04/12/2023   PLT 112 (L) 04/12/2023   NA 135 04/12/2023   K 3.6 04/12/2023   CL 94 (L) 04/12/2023   CO2 28 04/12/2023   GLUCOSE 101 (H) 04/12/2023   BUN 9 04/12/2023   CREATININE 1.59 (H) 04/12/2023   BILITOT 1.0 04/12/2023   ALKPHOS 274 (H) 04/12/2023   AST 46 (H) 04/12/2023   ALT 44 04/12/2023   PROT 6.3 (L) 04/12/2023   ALBUMIN 3.4 (L) 04/12/2023   CALCIUM 9.3 04/12/2023   GFRAA 13 (L) 10/13/2019    Speciality Comments: No specialty comments  available.  Procedures:  No procedures performed Allergies: Levofloxacin and Tape   Assessment / Plan:     Visit Diagnoses: No diagnosis found.  ***  Orders: No orders of the defined types were placed in this encounter.  No orders of the defined types were placed in this encounter.    Follow-Up Instructions: No follow-ups on file.   Metta Clines, RT  Note - This record has been created using [employer].  Chart creation errors  have been sought, but may not always  have been located. Such creation errors do not reflect on  the standard of medical care.

## 2023-04-14 NOTE — Telephone Encounter (Signed)
 Completed - Informed patient of updated schedule.

## 2023-04-14 NOTE — Telephone Encounter (Signed)
 Transition of Care - Initial Contact from Inpatient Facility  Date of discharge: 04/12/23 Date of contact: 04/14/23 Method: Phone Spoke to: Patient  Patient contacted to discuss transition of care from recent inpatient hospitalization. Patient was admitted to [facility] from 3/8-3/10/25 with discharge diagnosis of dyspnea/large pleural effusion   The discharge medication list was reviewed.   Patient will return to his/her outpatient HD unit on: Thursday   No other concerns at this time.

## 2023-04-15 LAB — TYPE AND SCREEN
ABO/RH(D): B POS
Antibody Screen: NEGATIVE
Unit division: 0
Unit division: 0

## 2023-04-15 LAB — BPAM RBC
Blood Product Expiration Date: 202504062359
Blood Product Expiration Date: 202504062359
ISSUE DATE / TIME: 202503092349
Unit Type and Rh: 7300
Unit Type and Rh: 7300

## 2023-04-17 ENCOUNTER — Emergency Department (HOSPITAL_COMMUNITY)

## 2023-04-17 ENCOUNTER — Encounter (HOSPITAL_COMMUNITY): Payer: Self-pay | Admitting: *Deleted

## 2023-04-17 ENCOUNTER — Other Ambulatory Visit: Payer: Self-pay

## 2023-04-17 ENCOUNTER — Inpatient Hospital Stay (HOSPITAL_COMMUNITY)
Admission: EM | Admit: 2023-04-17 | Discharge: 2023-05-04 | DRG: 871 | Disposition: E | Attending: Internal Medicine | Admitting: Internal Medicine

## 2023-04-17 DIAGNOSIS — Z7901 Long term (current) use of anticoagulants: Secondary | ICD-10-CM

## 2023-04-17 DIAGNOSIS — K219 Gastro-esophageal reflux disease without esophagitis: Secondary | ICD-10-CM | POA: Diagnosis present

## 2023-04-17 DIAGNOSIS — M797 Fibromyalgia: Secondary | ICD-10-CM | POA: Diagnosis present

## 2023-04-17 DIAGNOSIS — C787 Secondary malignant neoplasm of liver and intrahepatic bile duct: Secondary | ICD-10-CM | POA: Diagnosis present

## 2023-04-17 DIAGNOSIS — D631 Anemia in chronic kidney disease: Secondary | ICD-10-CM | POA: Diagnosis present

## 2023-04-17 DIAGNOSIS — C3492 Malignant neoplasm of unspecified part of left bronchus or lung: Secondary | ICD-10-CM | POA: Diagnosis present

## 2023-04-17 DIAGNOSIS — Z9221 Personal history of antineoplastic chemotherapy: Secondary | ICD-10-CM

## 2023-04-17 DIAGNOSIS — Z7951 Long term (current) use of inhaled steroids: Secondary | ICD-10-CM

## 2023-04-17 DIAGNOSIS — E876 Hypokalemia: Secondary | ICD-10-CM | POA: Diagnosis present

## 2023-04-17 DIAGNOSIS — Z881 Allergy status to other antibiotic agents status: Secondary | ICD-10-CM

## 2023-04-17 DIAGNOSIS — Z8711 Personal history of peptic ulcer disease: Secondary | ICD-10-CM

## 2023-04-17 DIAGNOSIS — H409 Unspecified glaucoma: Secondary | ICD-10-CM | POA: Diagnosis present

## 2023-04-17 DIAGNOSIS — N186 End stage renal disease: Secondary | ICD-10-CM | POA: Diagnosis present

## 2023-04-17 DIAGNOSIS — I9589 Other hypotension: Secondary | ICD-10-CM | POA: Diagnosis present

## 2023-04-17 DIAGNOSIS — J9621 Acute and chronic respiratory failure with hypoxia: Secondary | ICD-10-CM | POA: Diagnosis present

## 2023-04-17 DIAGNOSIS — F32A Depression, unspecified: Secondary | ICD-10-CM | POA: Diagnosis present

## 2023-04-17 DIAGNOSIS — J90 Pleural effusion, not elsewhere classified: Secondary | ICD-10-CM | POA: Diagnosis present

## 2023-04-17 DIAGNOSIS — T451X5A Adverse effect of antineoplastic and immunosuppressive drugs, initial encounter: Secondary | ICD-10-CM | POA: Diagnosis present

## 2023-04-17 DIAGNOSIS — C259 Malignant neoplasm of pancreas, unspecified: Secondary | ICD-10-CM | POA: Diagnosis present

## 2023-04-17 DIAGNOSIS — Z91048 Other nonmedicinal substance allergy status: Secondary | ICD-10-CM

## 2023-04-17 DIAGNOSIS — H5461 Unqualified visual loss, right eye, normal vision left eye: Secondary | ICD-10-CM | POA: Diagnosis present

## 2023-04-17 DIAGNOSIS — Z6821 Body mass index (BMI) 21.0-21.9, adult: Secondary | ICD-10-CM

## 2023-04-17 DIAGNOSIS — I509 Heart failure, unspecified: Secondary | ICD-10-CM | POA: Diagnosis present

## 2023-04-17 DIAGNOSIS — J9811 Atelectasis: Secondary | ICD-10-CM | POA: Diagnosis present

## 2023-04-17 DIAGNOSIS — F419 Anxiety disorder, unspecified: Secondary | ICD-10-CM | POA: Diagnosis present

## 2023-04-17 DIAGNOSIS — R609 Edema, unspecified: Secondary | ICD-10-CM | POA: Diagnosis not present

## 2023-04-17 DIAGNOSIS — R000 Tachycardia, unspecified: Secondary | ICD-10-CM | POA: Diagnosis not present

## 2023-04-17 DIAGNOSIS — E1122 Type 2 diabetes mellitus with diabetic chronic kidney disease: Secondary | ICD-10-CM | POA: Diagnosis present

## 2023-04-17 DIAGNOSIS — C7801 Secondary malignant neoplasm of right lung: Secondary | ICD-10-CM | POA: Diagnosis present

## 2023-04-17 DIAGNOSIS — L89152 Pressure ulcer of sacral region, stage 2: Secondary | ICD-10-CM | POA: Diagnosis present

## 2023-04-17 DIAGNOSIS — Z833 Family history of diabetes mellitus: Secondary | ICD-10-CM

## 2023-04-17 DIAGNOSIS — Z981 Arthrodesis status: Secondary | ICD-10-CM

## 2023-04-17 DIAGNOSIS — T8612 Kidney transplant failure: Secondary | ICD-10-CM | POA: Diagnosis present

## 2023-04-17 DIAGNOSIS — D649 Anemia, unspecified: Secondary | ICD-10-CM | POA: Diagnosis present

## 2023-04-17 DIAGNOSIS — R451 Restlessness and agitation: Secondary | ICD-10-CM | POA: Diagnosis not present

## 2023-04-17 DIAGNOSIS — R6521 Severe sepsis with septic shock: Secondary | ICD-10-CM | POA: Diagnosis not present

## 2023-04-17 DIAGNOSIS — Z8616 Personal history of COVID-19: Secondary | ICD-10-CM | POA: Diagnosis not present

## 2023-04-17 DIAGNOSIS — I480 Paroxysmal atrial fibrillation: Secondary | ICD-10-CM | POA: Diagnosis not present

## 2023-04-17 DIAGNOSIS — Z992 Dependence on renal dialysis: Secondary | ICD-10-CM | POA: Diagnosis not present

## 2023-04-17 DIAGNOSIS — Z66 Do not resuscitate: Secondary | ICD-10-CM | POA: Diagnosis present

## 2023-04-17 DIAGNOSIS — F22 Delusional disorders: Secondary | ICD-10-CM | POA: Diagnosis present

## 2023-04-17 DIAGNOSIS — J9620 Acute and chronic respiratory failure, unspecified whether with hypoxia or hypercapnia: Secondary | ICD-10-CM | POA: Diagnosis present

## 2023-04-17 DIAGNOSIS — L8990 Pressure ulcer of unspecified site, unspecified stage: Secondary | ICD-10-CM | POA: Insufficient documentation

## 2023-04-17 DIAGNOSIS — Z820 Family history of epilepsy and other diseases of the nervous system: Secondary | ICD-10-CM

## 2023-04-17 DIAGNOSIS — D61810 Antineoplastic chemotherapy induced pancytopenia: Secondary | ICD-10-CM | POA: Diagnosis present

## 2023-04-17 DIAGNOSIS — Z83511 Family history of glaucoma: Secondary | ICD-10-CM

## 2023-04-17 DIAGNOSIS — J910 Malignant pleural effusion: Principal | ICD-10-CM | POA: Diagnosis present

## 2023-04-17 DIAGNOSIS — C7800 Secondary malignant neoplasm of unspecified lung: Secondary | ICD-10-CM | POA: Diagnosis not present

## 2023-04-17 DIAGNOSIS — Z85118 Personal history of other malignant neoplasm of bronchus and lung: Secondary | ICD-10-CM

## 2023-04-17 DIAGNOSIS — Z9841 Cataract extraction status, right eye: Secondary | ICD-10-CM

## 2023-04-17 DIAGNOSIS — A419 Sepsis, unspecified organism: Principal | ICD-10-CM | POA: Diagnosis present

## 2023-04-17 DIAGNOSIS — N2581 Secondary hyperparathyroidism of renal origin: Secondary | ICD-10-CM | POA: Diagnosis present

## 2023-04-17 DIAGNOSIS — Z940 Kidney transplant status: Secondary | ICD-10-CM

## 2023-04-17 DIAGNOSIS — J939 Pneumothorax, unspecified: Secondary | ICD-10-CM | POA: Diagnosis not present

## 2023-04-17 DIAGNOSIS — C3490 Malignant neoplasm of unspecified part of unspecified bronchus or lung: Secondary | ICD-10-CM | POA: Diagnosis present

## 2023-04-17 DIAGNOSIS — Z515 Encounter for palliative care: Secondary | ICD-10-CM

## 2023-04-17 DIAGNOSIS — Z905 Acquired absence of kidney: Secondary | ICD-10-CM

## 2023-04-17 DIAGNOSIS — E872 Acidosis, unspecified: Secondary | ICD-10-CM | POA: Diagnosis present

## 2023-04-17 DIAGNOSIS — M109 Gout, unspecified: Secondary | ICD-10-CM | POA: Diagnosis present

## 2023-04-17 DIAGNOSIS — E440 Moderate protein-calorie malnutrition: Secondary | ICD-10-CM | POA: Diagnosis present

## 2023-04-17 DIAGNOSIS — R079 Chest pain, unspecified: Secondary | ICD-10-CM

## 2023-04-17 DIAGNOSIS — J189 Pneumonia, unspecified organism: Secondary | ICD-10-CM | POA: Diagnosis present

## 2023-04-17 DIAGNOSIS — Z86718 Personal history of other venous thrombosis and embolism: Secondary | ICD-10-CM

## 2023-04-17 DIAGNOSIS — Z87891 Personal history of nicotine dependence: Secondary | ICD-10-CM

## 2023-04-17 DIAGNOSIS — I959 Hypotension, unspecified: Secondary | ICD-10-CM | POA: Diagnosis not present

## 2023-04-17 DIAGNOSIS — Z79899 Other long term (current) drug therapy: Secondary | ICD-10-CM

## 2023-04-17 DIAGNOSIS — J9601 Acute respiratory failure with hypoxia: Secondary | ICD-10-CM | POA: Diagnosis not present

## 2023-04-17 DIAGNOSIS — Z8673 Personal history of transient ischemic attack (TIA), and cerebral infarction without residual deficits: Secondary | ICD-10-CM

## 2023-04-17 DIAGNOSIS — Z8249 Family history of ischemic heart disease and other diseases of the circulatory system: Secondary | ICD-10-CM

## 2023-04-17 DIAGNOSIS — Z800 Family history of malignant neoplasm of digestive organs: Secondary | ICD-10-CM

## 2023-04-17 LAB — CBC WITH DIFFERENTIAL/PLATELET
Abs Immature Granulocytes: 0.03 10*3/uL (ref 0.00–0.07)
Basophils Absolute: 0 10*3/uL (ref 0.0–0.1)
Basophils Relative: 0 %
Eosinophils Absolute: 0 10*3/uL (ref 0.0–0.5)
Eosinophils Relative: 1 %
HCT: 25.3 % — ABNORMAL LOW (ref 36.0–46.0)
Hemoglobin: 8.1 g/dL — ABNORMAL LOW (ref 12.0–15.0)
Immature Granulocytes: 2 %
Lymphocytes Relative: 43 %
Lymphs Abs: 0.6 10*3/uL — ABNORMAL LOW (ref 0.7–4.0)
MCH: 30 pg (ref 26.0–34.0)
MCHC: 32 g/dL (ref 30.0–36.0)
MCV: 93.7 fL (ref 80.0–100.0)
Monocytes Absolute: 0.3 10*3/uL (ref 0.1–1.0)
Monocytes Relative: 22 %
Neutro Abs: 0.4 10*3/uL — CL (ref 1.7–7.7)
Neutrophils Relative %: 32 %
Platelets: 40 10*3/uL — ABNORMAL LOW (ref 150–400)
RBC: 2.7 MIL/uL — ABNORMAL LOW (ref 3.87–5.11)
RDW: 15.6 % — ABNORMAL HIGH (ref 11.5–15.5)
Smear Review: DECREASED
WBC: 1.3 10*3/uL — CL (ref 4.0–10.5)
nRBC: 0 % (ref 0.0–0.2)

## 2023-04-17 LAB — TROPONIN I (HIGH SENSITIVITY)
Troponin I (High Sensitivity): 29 ng/L — ABNORMAL HIGH (ref <18)
Troponin I (High Sensitivity): 30 ng/L — ABNORMAL HIGH (ref <18)

## 2023-04-17 LAB — COMPREHENSIVE METABOLIC PANEL
ALT: 33 U/L (ref 0–44)
AST: 32 U/L (ref 15–41)
Albumin: 3 g/dL — ABNORMAL LOW (ref 3.5–5.0)
Alkaline Phosphatase: 257 U/L — ABNORMAL HIGH (ref 38–126)
Anion gap: 16 — ABNORMAL HIGH (ref 5–15)
BUN: 9 mg/dL (ref 6–20)
CO2: 27 mmol/L (ref 22–32)
Calcium: 8.1 mg/dL — ABNORMAL LOW (ref 8.9–10.3)
Chloride: 96 mmol/L — ABNORMAL LOW (ref 98–111)
Creatinine, Ser: 1.62 mg/dL — ABNORMAL HIGH (ref 0.44–1.00)
GFR, Estimated: 37 mL/min — ABNORMAL LOW (ref >60)
Glucose, Bld: 84 mg/dL (ref 70–99)
Potassium: 3 mmol/L — ABNORMAL LOW (ref 3.5–5.1)
Sodium: 139 mmol/L (ref 135–145)
Total Bilirubin: 0.5 mg/dL (ref 0.0–1.2)
Total Protein: 5.5 g/dL — ABNORMAL LOW (ref 6.5–8.1)

## 2023-04-17 LAB — RESP PANEL BY RT-PCR (RSV, FLU A&B, COVID)  RVPGX2
Influenza A by PCR: NEGATIVE
Influenza B by PCR: NEGATIVE
Resp Syncytial Virus by PCR: NEGATIVE
SARS Coronavirus 2 by RT PCR: NEGATIVE

## 2023-04-17 LAB — I-STAT CG4 LACTIC ACID, ED: Lactic Acid, Venous: 2.2 mmol/L (ref 0.5–1.9)

## 2023-04-17 MED ORDER — GUAIFENESIN 100 MG/5ML PO LIQD
5.0000 mL | Freq: Once | ORAL | Status: AC
Start: 2023-04-17 — End: 2023-04-17
  Administered 2023-04-17: 5 mL via ORAL
  Filled 2023-04-17: qty 10

## 2023-04-17 MED ORDER — FAMOTIDINE 20 MG PO TABS
20.0000 mg | ORAL_TABLET | Freq: Two times a day (BID) | ORAL | Status: DC
  Administered 2023-04-18 – 2023-04-19 (×4): 20 mg via ORAL
  Filled 2023-04-17 (×4): qty 1

## 2023-04-17 MED ORDER — COLCHICINE 0.6 MG PO TABS: 0.6000 mg | ORAL_TABLET | Freq: Every day | ORAL | Status: DC | PRN

## 2023-04-17 MED ORDER — OXYCODONE-ACETAMINOPHEN 5-325 MG PO TABS
1.0000 | ORAL_TABLET | ORAL | Status: DC | PRN
  Administered 2023-04-19 (×3): 1 via ORAL
  Filled 2023-04-17 (×3): qty 1

## 2023-04-17 MED ORDER — GABAPENTIN 100 MG PO CAPS
100.0000 mg | ORAL_CAPSULE | Freq: Three times a day (TID) | ORAL | Status: DC
  Administered 2023-04-18 – 2023-04-19 (×7): 100 mg via ORAL
  Filled 2023-04-17 (×7): qty 1

## 2023-04-17 MED ORDER — HYDROMORPHONE HCL 1 MG/ML IJ SOLN
0.5000 mg | Freq: Once | INTRAMUSCULAR | Status: AC
  Administered 2023-04-17: 0.5 mg via INTRAVENOUS
  Filled 2023-04-17: qty 1

## 2023-04-17 MED ORDER — ALLOPURINOL 100 MG PO TABS
100.0000 mg | ORAL_TABLET | Freq: Every day | ORAL | Status: DC
  Administered 2023-04-18 – 2023-04-19 (×2): 100 mg via ORAL
  Filled 2023-04-17 (×2): qty 1

## 2023-04-17 MED ORDER — FENTANYL CITRATE PF 50 MCG/ML IJ SOSY: 25.0000 ug | PREFILLED_SYRINGE | INTRAMUSCULAR | Status: DC | PRN

## 2023-04-17 MED ORDER — OXYCODONE-ACETAMINOPHEN 10-325 MG PO TABS: 1.0000 | ORAL_TABLET | ORAL | Status: DC | PRN

## 2023-04-17 MED ORDER — OXYCODONE HCL 5 MG PO TABS
5.0000 mg | ORAL_TABLET | ORAL | Status: DC | PRN
  Administered 2023-04-18 – 2023-04-19 (×4): 5 mg via ORAL
  Filled 2023-04-17 (×4): qty 1

## 2023-04-17 MED ORDER — POLYETHYLENE GLYCOL 3350 17 G PO PACK: 17.0000 g | PACK | Freq: Every day | ORAL | Status: DC | PRN

## 2023-04-17 MED ORDER — ALBUTEROL SULFATE (2.5 MG/3ML) 0.083% IN NEBU
2.5000 mg | INHALATION_SOLUTION | Freq: Once | RESPIRATORY_TRACT | Status: AC
  Administered 2023-04-17: 2.5 mg via RESPIRATORY_TRACT
  Filled 2023-04-17: qty 3

## 2023-04-17 MED ORDER — AMIODARONE HCL 200 MG PO TABS
100.0000 mg | ORAL_TABLET | Freq: Every day | ORAL | Status: DC
  Administered 2023-04-18 – 2023-04-19 (×2): 100 mg via ORAL
  Filled 2023-04-17 (×2): qty 1

## 2023-04-17 MED ORDER — ALBUTEROL SULFATE (2.5 MG/3ML) 0.083% IN NEBU
2.5000 mg | INHALATION_SOLUTION | RESPIRATORY_TRACT | Status: DC | PRN
  Administered 2023-04-19 (×2): 2.5 mg via RESPIRATORY_TRACT
  Filled 2023-04-17 (×3): qty 3

## 2023-04-17 MED ORDER — PANTOPRAZOLE SODIUM 40 MG PO TBEC
40.0000 mg | DELAYED_RELEASE_TABLET | Freq: Every day | ORAL | Status: DC
  Administered 2023-04-18 – 2023-04-19 (×2): 40 mg via ORAL
  Filled 2023-04-17 (×2): qty 1

## 2023-04-17 MED ORDER — MIDODRINE HCL 5 MG PO TABS
15.0000 mg | ORAL_TABLET | Freq: Three times a day (TID) | ORAL | Status: DC
  Filled 2023-04-17: qty 3

## 2023-04-17 MED ORDER — ONDANSETRON HCL 4 MG/2ML IJ SOLN: 4.0000 mg | Freq: Four times a day (QID) | INTRAMUSCULAR | Status: DC | PRN

## 2023-04-17 MED ORDER — ONDANSETRON HCL 4 MG PO TABS: 4.0000 mg | ORAL_TABLET | Freq: Four times a day (QID) | ORAL | Status: DC | PRN

## 2023-04-17 MED ORDER — CEFEPIME HCL 2 G IV SOLR
2.0000 g | Freq: Once | INTRAVENOUS | Status: AC
  Administered 2023-04-17: 2 g via INTRAVENOUS
  Filled 2023-04-17: qty 12.5

## 2023-04-17 MED ORDER — SERTRALINE HCL 50 MG PO TABS
25.0000 mg | ORAL_TABLET | Freq: Every day | ORAL | Status: DC
  Administered 2023-04-18 – 2023-04-19 (×2): 25 mg via ORAL
  Filled 2023-04-17 (×2): qty 1

## 2023-04-17 NOTE — ED Notes (Signed)
 The pt is a dialysis pt  and she was dialyzed earlier today

## 2023-04-17 NOTE — H&P (Addendum)
 History and Physical    Tina Watkins:[id number] DOB: [birth year] DOA: 04/17/2023  PCP: Tina Andrew, NP   Chief Complaint:  sob  HPI: Tina Watkins is a 58 y.o. female with medical history significant of metastatic lung adenocarcinoma, suspected pancreatic adenocarcinoma, ESRD on hemodialysis, CHF who presented to the ed due shortness of breath.  Patient went to dialysis today and was hypotensive so was unable to complete her session.  She was transported to the emergency department where she found to be afebrile and hypotensive with systolics in the 80s.  Chest ray was obtained which showed a large left pleural effusion with near complete opacification of the left lung.  Labs showed respiratory viral panel negative, potassium 3.0, creatinine 1.6, troponin and 29, 30.  Lactic acid 2.2.  Patient was admitted for further workup.  Of note she was just discharged from the hospital on 3/10 at which time she underwent thoracentesis for drainage of her large left pleural effusion.  She has been seeing oncology for her stage IV lung cancer.  On assessment she was tachypneic in the 40s and hypotensive with systolics in the 80s.  She also became tachycardic in the 130s.  I had an extensive goals of care discussion with the patient and her fianc.  I explained the risks of intubation and patient would not like to be hooked up to a ventilator.  Her CODE STATUS was made DNR without intubation.  In addition I consulted pulmonology who recommended CT chest with subsequent chest tube and will assess the patient.   Review of Systems: Review of Systems  Constitutional: Negative.  Negative for chills and fever.  HENT: Negative.    Eyes: Negative.   Respiratory:  Positive for cough, shortness of breath and wheezing.   Cardiovascular: Negative.  Negative for chest pain and palpitations.  Gastrointestinal: Negative.   Genitourinary: Negative.   Musculoskeletal: Negative.   Skin: Negative.    Neurological: Negative.   Endo/Heme/Allergies: Negative.   Psychiatric/Behavioral: Negative.       As per HPI otherwise 10 point review of systems negative.   Allergies  Allergen Reactions   Levofloxacin Itching and Rash   Tape Other (See Comments)    "Certain surgical tapes peel off my skin"    Past Medical History:  Diagnosis Date   Anxiety    Bacteremia due to Gram-negative bacteria 05/23/2011   Blind    right eye   Blind right eye    CHF (congestive heart failure) (HCC)    Chronic lower back pain    Complication of anesthesia    "woke up during OR; I have an extremely high tolerance" (12/11/2011) 1 procedure was graft; the other procedures were procedures that are typically done with sedation.   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   Dysrhythmia    "tachycardia" (12/11/2011) new onset afib 10/15/14 EKG   E coli bacteremia 06/18/2011   Elevated LDL cholesterol level 12/2018   ESRD (end stage renal disease) (HCC) 06/12/2011   Tues-Thurs-Sat dialysis   Fibromyalgia    Gastroparesis    GERD (gastroesophageal reflux disease)    Glaucoma    right eye   Gout    H/O carpal tunnel syndrome    Headache 10/2019   no current problem per patient on 11/12/22   Headache(784.0)    "not often anymore" (12/11/2011)   Herpes genitalia 1994   History of blood transfusion    "more  than a few times" (12/11/2011)   History of COVID-19, most recently 09/2022 05/09/2019   History of deep vein thrombosis (DVT) of right lower extremity 11/20/2017   History of stomach ulcers    Hypotension    Iron deficiency anemia    New onset a-fib (HCC)    10/15/14 EKG   Osteopenia    Peripheral neuropathy 11/2018   Pneumonia    x several but years ago   Pressure ulcer of sacral region, stage 1 07/2019   Seizures (HCC) 1994   "post transplant; only have had that one" (12/11/2011)   Spinal stenosis in cervical region    Stroke [facility])      left basal ganglia lacunar infarct; Right frontal lobe lacunar infarct.   Stroke [facility]) ~ 1999; 2001   "briefly lost my vision; lost my right eye" (12/11/2011)   Vitamin D deficiency 12/2018    Past Surgical History:  Procedure Laterality Date   ANTERIOR CERVICAL DECOMP/DISCECTOMY FUSION N/A 01/08/2015   Procedure: Anterior Cervical Three-Four/Four-Five Decompression/Diskectomy/Fusion;  Surgeon: Hilda Lias, MD;  Location: [facility] NEURO ORS;  Service: Neurosurgery;  Laterality: N/A;  C3-4 C4-5 Anterior cervical decompression/diskectomy/fusion   APPENDECTOMY  ~ 2004   BIOPSY  09/12/2020   Procedure: BIOPSY;  Surgeon: Rachael Fee, MD;  Location: [facility] ENDOSCOPY;  Service: Endoscopy;;   BRONCHIAL BIOPSY  11/16/2022   Procedure: BRONCHIAL BIOPSIES;  Surgeon: Leslye Peer, MD;  Location: [facility] ENDOSCOPY;  Service: Pulmonary;;   BRONCHIAL BRUSHINGS  11/16/2022   Procedure: BRONCHIAL BRUSHINGS;  Surgeon: Leslye Peer, MD;  Location: [facility] ENDOSCOPY;  Service: Pulmonary;;   BRONCHIAL NEEDLE ASPIRATION BIOPSY  11/16/2022   Procedure: BRONCHIAL NEEDLE ASPIRATION BIOPSIES;  Surgeon: Leslye Peer, MD;  Location: [facility] ENDOSCOPY;  Service: Pulmonary;;   CATARACT EXTRACTION     right eye   COLONOSCOPY     COLONOSCOPY WITH PROPOFOL N/A 09/12/2020   Procedure: COLONOSCOPY WITH PROPOFOL;  Surgeon: Rachael Fee, MD;  Location: [facility] ENDOSCOPY;  Service: Endoscopy;  Laterality: N/A;   ENUCLEATION  2001   "right"   ESOPHAGOGASTRODUODENOSCOPY (EGD) WITH PROPOFOL N/A 04/21/2012   Procedure: ESOPHAGOGASTRODUODENOSCOPY (EGD) WITH PROPOFOL;  Surgeon: Rachael Fee, MD;  Location: [facility] ENDOSCOPY;  Service: Endoscopy;  Laterality: N/A;   ESOPHAGOGASTRODUODENOSCOPY (EGD) WITH PROPOFOL N/A 09/12/2020   Procedure: ESOPHAGOGASTRODUODENOSCOPY (EGD) WITH PROPOFOL;  Surgeon: Rachael Fee, MD;  Location: [facility] ENDOSCOPY;  Service: Endoscopy;  Laterality: N/A;   FIDUCIAL MARKER PLACEMENT  11/16/2022   Procedure: FIDUCIAL  MARKER PLACEMENT;  Surgeon: Leslye Peer, MD;  Location: [facility] ENDOSCOPY;  Service: Pulmonary;;   HEMOSTASIS CONTROL  11/16/2022   Procedure: HEMOSTASIS CONTROL;  Surgeon: Leslye Peer, MD;  Location: [facility] ENDOSCOPY;  Service: Pulmonary;;   INSERTION OF DIALYSIS CATHETER  1988   "AV graft LUA & LFA; LUA worked for 1 day; LFA never worked"   IR FLUORO GUIDE CV LINE RIGHT  07/11/2019   IR FLUORO GUIDE CV LINE RIGHT  10/20/2019   IR FLUORO GUIDE CV LINE RIGHT  05/31/2020   IR FLUORO GUIDE CV LINE RIGHT  03/29/2023   IR PTA VENOUS EXCEPT DIALYSIS CIRCUIT  05/31/2020   IR RADIOLOGY PERIPHERAL GUIDED IV START  07/11/2019   IR THORACENTESIS ASP PLEURAL SPACE W/IMG GUIDE  04/12/2023   IR US GUIDE VASC ACCESS RIGHT  07/11/2019   IR US GUIDE VASC ACCESS RIGHT  07/11/2019   IR US GUIDE VASC ACCESS RIGHT  07/11/2019   IR VENOCAVAGRAM IVC  05/31/2020  IR VENOCAVAGRAM IVC  03/29/2023   KIDNEY TRANSPLANT  1994; 1999; 2005   "right"   MULTIPLE TOOTH EXTRACTIONS     POLYPECTOMY  09/12/2020   Procedure: POLYPECTOMY;  Surgeon: Rachael Fee, MD;  Location: [facility] ENDOSCOPY;  Service: Endoscopy;;   RIGHT HEART CATH N/A 03/23/2019   Procedure: RIGHT HEART CATH;  Surgeon: Dolores Patty, MD;  Location: [facility] INVASIVE CV LAB;  Service: Cardiovascular;  Laterality: N/A;   TONSILLECTOMY     TOTAL NEPHRECTOMY  1988?; 1994; 2005   VIDEO BRONCHOSCOPY WITH RADIAL ENDOBRONCHIAL ULTRASOUND  11/16/2022   Procedure: VIDEO BRONCHOSCOPY WITH RADIAL ENDOBRONCHIAL ULTRASOUND;  Surgeon: Leslye Peer, MD;  Location: [facility] ENDOSCOPY;  Service: Pulmonary;;     reports that she has quit smoking. Her smoking use included cigarettes. She has a 52.5 pack-year smoking history. She has never been exposed to tobacco smoke. She has never used smokeless tobacco. She reports that she does not currently use alcohol. She reports current drug use. Frequency: 1.00 time per week. Drug: Marijuana.  Family History  Problem Relation Age of Onset   Glaucoma  Mother    Pancreatic cancer Father    Multiple sclerosis Brother    Diabetes Maternal Uncle    Hypertension Maternal Grandmother    Breast cancer Neg Hx     Prior to Admission medications   Medication Sig Start Date End Date Taking? Authorizing Provider  allopurinol (ZYLOPRIM) 100 MG tablet Take 0.5 tablets (50 mg total) by mouth 2 (two) times a week. Patient taking differently: Take 100 mg by mouth daily in the afternoon. 12/24/22  Yes Tawkaliyar, Roya, DO  amiodarone (PACERONE) 200 MG tablet Take 0.5 tablets (100 mg total) by mouth daily. 07/03/22  Yes Eustace Pen, PA-C  apixaban (ELIQUIS) 2.5 MG TABS tablet Take 1 tablet (2.5 mg total) by mouth 2 (two) times daily. Okay to restart this medication on 11/18/2022 11/16/22  Yes Leslye Peer, MD  Colchicine 0.6 MG CAPS Take 0.6 mg by mouth daily as needed (gout flare up). 01/12/22  Yes Tina Andrew, NP  cyclobenzaprine (FLEXERIL) 5 MG tablet Take 1 tablet (5 mg total) by mouth at bedtime as needed. 08/21/22  Yes Rice, Jamesetta Orleans, MD  dexamethasone (DECADRON) 4 MG tablet Take 2 tablets (8mg ) by mouth daily starting the day after carboplatin for 3 days. Take with food 01/20/23  Yes Pasam, Avinash, MD  diphenhydrAMINE (BENADRYL) 25 mg capsule Take 1 capsule (25 mg total) by mouth every 8 (eight) hours as needed. 07/21/19  Yes Kallie Locks, FNP  famotidine (PEPCID) 20 MG tablet Take 1 tablet (20 mg total) by mouth 2 (two) times daily. 03/15/23  Yes Tina Andrew, NP  fluticasone-salmeterol (ADVAIR HFA) 409-81 MCG/ACT inhaler Inhale 2 puffs into the lungs 2 (two) times daily. 04/28/21  Yes Martina Sinner, MD  gabapentin (NEURONTIN) 100 MG capsule Take 100 mg by mouth 3 (three) times daily.   Yes [provider]  Lactulose 20 GM/30ML SOLN Take 30 mLs (20 g total) by mouth 2 (two) times daily as needed (constipation). 03/01/23  Yes Ghimire, Lyndel Safe, MD  midodrine (PROAMATINE) 10 MG tablet TAKE 1.5 TABLETS (15 MG TOTAL) BY  MOUTH 3 (THREE) TIMES DAILY. NEEDS FOLLOW UP APPOINTMENT FOR ANYMORE REFILLS Patient taking differently: Take 20 mg by mouth 3 (three) times daily. NEEDS FOLLOW UP APPOINTMENT FOR ANYMORE REFILLS 01/18/23  Yes Bensimhon, Bevelyn Buckles, MD  omeprazole (PRILOSEC) 40 MG capsule TAKE 1 CAPSULE (40 MG TOTAL) BY  MOUTH DAILY. 01/15/23  Yes Meryl Dare, MD  oxyCODONE-acetaminophen (PERCOCET) 10-325 MG tablet Take 1 tablet by mouth 4 (four) times daily as needed for pain. 09/08/19  Yes [provider]  prochlorperazine (COMPAZINE) 10 MG tablet Take 10 mg by mouth every 6 (six) hours as needed. 04/10/23  Yes [provider]  sertraline (ZOLOFT) 25 MG tablet Take 1 tablet (25 mg total) by mouth daily. 01/08/23  Yes Tina Andrew, NP  VELPHORO 500 MG chewable tablet Chew 500 mg by mouth See admin instructions. Take one tablet by mouth three times a daily with meals and snakes per patient   Yes [provider]  AURYXIA 1 GM 210 MG(Fe) tablet Take 420 mg by mouth 3 (three) times daily. Patient not taking: Reported on 04/11/2023    [provider]  calcium-vitamin D (OSCAL WITH D) 500-5 MG-MCG tablet Take 1 tablet by mouth 2 (two) times daily. Patient not taking: Reported on 04/11/2023 03/21/23   Arnetha Courser, MD  dorzolamide-timolol (COSOPT) 2-0.5 % ophthalmic solution Place 1 drop into the left eye every evening. 12/13/21   [provider]  metoCLOPramide (REGLAN) 10 MG tablet Take 1 tablet (10 mg total) by mouth 3 (three) times daily before meals. Patient taking differently: Take 10 mg by mouth daily in the afternoon. 12/02/22   Meryl Dare, MD  ondansetron (ZOFRAN) 8 MG tablet Take 1 tablet (8 mg total) by mouth every 8 (eight) hours as needed for nausea or vomiting. Start on the third day after carboplatin. Patient not taking: Reported on 04/17/2023 01/20/23   Meryl Crutch, MD    Physical Exam: Vitals:   04/17/23 2249 04/17/23 2300 04/17/23 2315 04/17/23 2330   BP:  (!) 82/54 (!) 84/53 94/66  Pulse:  (!) 106 (!) 103 (!) 143  Resp:  (!) 31 (!) 22 (!) 24  Temp: 99 F (37.2 C)     TempSrc: Oral     SpO2:  100% 100% 100%  Weight:      Height:       Physical Exam Constitutional:      Appearance: She is normal weight.  HENT:     Head: Normocephalic.     Mouth/Throat:     Mouth: Mucous membranes are moist.  Eyes:     Pupils: Pupils are equal, round, and reactive to light.  Cardiovascular:     Rate and Rhythm: Tachycardia present.  Pulmonary:     Effort: Tachypnea and accessory muscle usage present.     Breath sounds: Decreased breath sounds present.  Chest:     Chest wall: Mass present.  Abdominal:     Palpations: Abdomen is soft.  Musculoskeletal:        General: Normal range of motion.     Cervical back: Normal range of motion.  Skin:    General: Skin is warm.     Capillary Refill: Capillary refill takes less than 2 seconds.  Neurological:     General: No focal deficit present.     Mental Status: She is alert.  Psychiatric:        Mood and Affect: Mood normal.       Labs on Admission: I have personally reviewed the patients's labs and imaging studies.  Assessment/Plan Principal Problem:   Pleural effusion   #Acute on chronic respiratory failure secondary to large left pleural effusion # Severe sepsis, likely pulmonary etiology # Neutropenic, pancytopenic, most likely chemotherapy-induced -Tachypneic, tachycardic - Lactic acidosis  Plan: Start vancomycin and  cefepime Consult critical care Increased oxygen to maintain sats of 90%/place on bipap Trend lactic acid Chest tube/thora ordered CT chest, CTA PE to rule out PE  # ESRD on hemodialysis-consult nephrology in morning  # Metastatic lung adenocarcinoma # Possible pancreatic adenocarcinoma -Follows with oncology  # History of gout-continue as needed allopurinol, colchicine  # Paroxysmal A-fib-continue amiodarone  # Chronic hypotension-continue  midodrine  # GERD-continue Protonix  # Depression-continue Zoloft  Admission status: Inpatient Progressive  Certification: The appropriate patient status for this patient is INPATIENT. Inpatient status is judged to be reasonable and necessary in order to provide the required intensity of service to ensure the patient's safety. The patient's presenting symptoms, physical exam findings, and initial radiographic and laboratory data in the context of their chronic comorbidities is felt to place them at high risk for further clinical deterioration. Furthermore, it is not anticipated that the patient will be medically stable for discharge from the hospital within 2 midnights of admission.   * I certify that at the point of admission it is my clinical judgment that the patient will require inpatient hospital care spanning beyond 2 midnights from the point of admission due to high intensity of service, high risk for further deterioration and high frequency of surveillance required.Alan Mulder MD [facility] If 7PM-7AM, please contact night-coverage [email or web address]  04/17/2023, 11:58 PM

## 2023-04-17 NOTE — ED Provider Notes (Signed)
 [place] EMERGENCY DEPARTMENT AT [facility] Provider Note   CSN: [id number] Arrival date & time: 04/17/23  1523     History {Add pertinent medical, surgical, social history, OB history to HPI:1} Chief Complaint  Patient presents with   Shortness of Breath    Tina Watkins is a 58 y.o. female.  The history is provided by the patient, medical records and a relative.  Shortness of Breath Tina Watkins is a 58 y.o. female who presents to the Emergency Department complaining of chest pain.  She presents to the emergency department accompanied by her uncle for evaluation of chest pain, cough and difficulty breathing for the last 3 days.  She has ESRD and completed her dialysis session today.  She reports cough that is not productive.  Associated body aches.  She also has abdominal pain.  No fever, vomiting.     Home Medications Prior to Admission medications   Medication Sig Start Date End Date Taking? Authorizing Provider  allopurinol (ZYLOPRIM) 100 MG tablet Take 0.5 tablets (50 mg total) by mouth 2 (two) times a week. Patient taking differently: Take 100 mg by mouth daily in the afternoon. 12/24/22   Tawkaliyar, Roya, DO  amiodarone (PACERONE) 200 MG tablet Take 0.5 tablets (100 mg total) by mouth daily. 07/03/22   Eustace Pen, PA-C  apixaban (ELIQUIS) 2.5 MG TABS tablet Take 1 tablet (2.5 mg total) by mouth 2 (two) times daily. Okay to restart this medication on 11/18/2022 11/16/22   Leslye Peer, MD  AURYXIA 1 GM 210 MG(Fe) tablet Take 420 mg by mouth 3 (three) times daily. Patient not taking: Reported on 04/11/2023    [provider]  calcium-vitamin D (OSCAL WITH D) 500-5 MG-MCG tablet Take 1 tablet by mouth 2 (two) times daily. Patient not taking: Reported on 04/11/2023 03/21/23   Arnetha Courser, MD  Colchicine 0.6 MG CAPS Take 0.6 mg by mouth daily as needed (gout flare up). 01/12/22   Ivonne Andrew, NP  cyclobenzaprine (FLEXERIL) 5 MG tablet  Take 1 tablet (5 mg total) by mouth at bedtime as needed. 08/21/22   Rice, Jamesetta Orleans, MD  dexamethasone (DECADRON) 4 MG tablet Take 2 tablets (8mg ) by mouth daily starting the day after carboplatin for 3 days. Take with food 01/20/23   Pasam, Avinash, MD  diphenhydrAMINE (BENADRYL) 25 mg capsule Take 1 capsule (25 mg total) by mouth every 8 (eight) hours as needed. 07/21/19   Kallie Locks, FNP  dorzolamide-timolol (COSOPT) 2-0.5 % ophthalmic solution Place 1 drop into the left eye every evening. 12/13/21   [provider]  famotidine (PEPCID) 20 MG tablet Take 1 tablet (20 mg total) by mouth 2 (two) times daily. 03/15/23   Ivonne Andrew, NP  fluticasone-salmeterol (ADVAIR HFA) 034-74 MCG/ACT inhaler Inhale 2 puffs into the lungs 2 (two) times daily. 04/28/21   Martina Sinner, MD  gabapentin (NEURONTIN) 100 MG capsule Take 100 mg by mouth 3 (three) times daily as needed (nerve pain).    [provider]  Lactulose 20 GM/30ML SOLN Take 30 mLs (20 g total) by mouth 2 (two) times daily as needed (constipation). 03/01/23   Dorcas Carrow, MD  metoCLOPramide (REGLAN) 10 MG tablet Take 1 tablet (10 mg total) by mouth 3 (three) times daily before meals. Patient taking differently: Take 10 mg by mouth daily in the afternoon. 12/02/22   Meryl Dare, MD  midodrine (PROAMATINE) 10 MG tablet TAKE 1.5 TABLETS (15 MG TOTAL)  BY MOUTH 3 (THREE) TIMES DAILY. NEEDS FOLLOW UP APPOINTMENT FOR ANYMORE REFILLS Patient taking differently: Take 20 mg by mouth 3 (three) times daily. NEEDS FOLLOW UP APPOINTMENT FOR ANYMORE REFILLS 01/18/23   Bensimhon, Bevelyn Buckles, MD  omeprazole (PRILOSEC) 40 MG capsule TAKE 1 CAPSULE (40 MG TOTAL) BY MOUTH DAILY. 01/15/23   Meryl Dare, MD  ondansetron (ZOFRAN) 8 MG tablet Take 1 tablet (8 mg total) by mouth every 8 (eight) hours as needed for nausea or vomiting. Start on the third day after carboplatin. 01/20/23   Pasam, Archie Patten, MD  oxyCODONE-acetaminophen  (PERCOCET) 10-325 MG tablet Take 1 tablet by mouth 4 (four) times daily as needed for pain. 09/08/19   [provider]  prochlorperazine (COMPAZINE) 10 MG tablet Take 10 mg by mouth every 6 (six) hours as needed. Patient not taking: Reported on 04/11/2023 04/10/23   [provider]  sertraline (ZOLOFT) 25 MG tablet Take 1 tablet (25 mg total) by mouth daily. 01/08/23   Ivonne Andrew, NP  VELPHORO 500 MG chewable tablet Chew 500 mg by mouth See admin instructions. Take one tablet by mouth three times a daily with meals and snakes per patient    [provider]      Allergies    Levofloxacin and Tape    Review of Systems   Review of Systems  Respiratory:  Positive for shortness of breath.   All other systems reviewed and are negative.   Physical Exam Updated Vital Signs BP (!) 89/55 (BP Location: Right Arm)   Pulse 92   Temp 98 F (36.7 C)   Resp 16   Ht 4\' 11"  (1.499 m)   Wt 47.8 kg   SpO2 100%   BMI 21.28 kg/m  Physical Exam Vitals and nursing note reviewed.  Constitutional:      Appearance: She is well-developed.  HENT:     Head: Normocephalic and atraumatic.  Neck:     Comments: Bulky supraclavicular lymphadenopathy Cardiovascular:     Rate and Rhythm: Normal rate and regular rhythm.     Heart sounds: No murmur heard. Pulmonary:     Effort: Pulmonary effort is normal. No respiratory distress.     Comments: Rhonchi bilaterally. Abdominal:     Palpations: Abdomen is soft.     Tenderness: There is no abdominal tenderness. There is no guarding or rebound.  Musculoskeletal:        General: No tenderness.     Comments: Nonpitting edema to bilateral lower extremities.  There is a midline catheter in the left upper arm.  Skin:    General: Skin is warm and dry.  Neurological:     Mental Status: She is alert and oriented to person, place, and time.  Psychiatric:        Behavior: Behavior normal.     ED Results / Procedures / Treatments    Labs (all labs ordered are listed, but only abnormal results are displayed) Labs Reviewed  RESP PANEL BY RT-PCR (RSV, FLU A&B, COVID)  [id number]  COMPREHENSIVE METABOLIC PANEL  CBC WITH DIFFERENTIAL/PLATELET  TROPONIN I (HIGH SENSITIVITY)    EKG None  Radiology No results found.  Procedures Procedures  {Document cardiac monitor, telemetry assessment procedure when appropriate:1}  Medications Ordered in ED Medications  HYDROmorphone (DILAUDID) injection 0.5 mg (has no administration in time range)    ED Course/ Medical Decision Making/ A&P   {   Click here for ABCD2, HEART and other calculatorsREFRESH Note before signing :1}  Medical Decision Making Amount and/or Complexity of Data Reviewed Labs: ordered. Radiology: ordered.  Risk Prescription drug management.   ***  {Document critical care time when appropriate:1} {Document review of labs and clinical decision tools ie heart score, Chads2Vasc2 etc:1}  {Document your independent review of radiology images, and any outside records:1} {Document your discussion with family members, caretakers, and with consultants:1} {Document social determinants of health affecting pt's care:1} {Document your decision making why or why not admission, treatments were needed:1} Final Clinical Impression(s) / ED Diagnoses Final diagnoses:  None    Rx / DC Orders ED Discharge Orders     None

## 2023-04-17 NOTE — ED Triage Notes (Signed)
 The pt has lung cancer and for the past 2 weeks she has had sob  c/o chest pain  she is currently getting chemo at [facility]    she has a medline catheter lt arm

## 2023-04-17 NOTE — H&P (Incomplete)
 History and Physical    Tina Watkins ZOX:[id number] DOB: 29-Apr-1965 DOA: 04/17/2023  PCP: Ivonne Andrew, NP   Chief Complaint:  sob  HPI: Tina Watkins is a 58 y.o. female with medical history significant of metastatic lung adenocarcinoma, suspected pancreatic adenocarcinoma, ESRD on 8 hemodialysis, CHF   Review of Systems: ROS   As per HPI otherwise 10 point review of systems negative.   Allergies  Allergen Reactions  . Levofloxacin Itching and Rash  . Tape Other (See Comments)    "Certain surgical tapes peel off my skin"    Past Medical History:  Diagnosis Date  . Anxiety   . Bacteremia due to Gram-negative bacteria 05/23/2011  . Blind    right eye  . Blind right eye   . CHF (congestive heart failure) (HCC)   . Chronic lower back pain   . Complication of anesthesia    "woke up during OR; I have an extremely high tolerance" (12/11/2011) 1 procedure was graft; the other procedures were procedures that are typically done with sedation.  . DDD (degenerative disc disease), cervical   . Depression   . 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 Dysrhythmia    "tachycardia" (12/11/2011) new onset afib 10/15/14 EKG  . E coli bacteremia 06/18/2011  . Elevated LDL cholesterol level 12/2018  . ESRD (end stage renal disease) (HCC) 06/12/2011   Tues-Thurs-Sat dialysis  . Fibromyalgia   . Gastroparesis   . GERD (gastroesophageal reflux disease)   . Glaucoma    right eye  . Gout   . H/O carpal tunnel syndrome   . Headache 10/2019   no current problem per patient on 11/12/22  . Headache(784.0)    "not often anymore" (12/11/2011)  . Herpes genitalia 1994  . History of blood transfusion    "more than a few times" (12/11/2011)  . History of COVID-19, most recently 09/2022 05/09/2019  . History of deep vein thrombosis (DVT) of right lower extremity 11/20/2017  . History of stomach ulcers   . Hypotension   . Iron deficiency anemia   . New onset a-fib (HCC)    10/15/14  EKG  . Osteopenia   . Peripheral neuropathy 11/2018  . Pneumonia    x several but years ago  . Pressure ulcer of sacral region, stage 1 07/2019  . Seizures (HCC) 1994   "post transplant; only have had that one" (12/11/2011)  . Spinal stenosis in cervical region   . Stroke [facility])     left basal ganglia lacunar infarct; Right frontal lobe lacunar infarct.  . Stroke [facility]) ~ 1999; 2001   "briefly lost my vision; lost my right eye" (12/11/2011)  . Vitamin D deficiency 12/2018    Past Surgical History:  Procedure Laterality Date  . ANTERIOR CERVICAL DECOMP/DISCECTOMY FUSION N/A 01/08/2015   Procedure: Anterior Cervical Three-Four/Four-Five Decompression/Diskectomy/Fusion;  Surgeon: Hilda Lias, MD;  Location: [facility] NEURO ORS;  Service: Neurosurgery;  Laterality: N/A;  C3-4 C4-5 Anterior cervical decompression/diskectomy/fusion  . APPENDECTOMY  ~ 2004  . BIOPSY  09/12/2020   Procedure: BIOPSY;  Surgeon: Rachael Fee, MD;  Location: [facility] ENDOSCOPY;  Service: Endoscopy;;  . BRONCHIAL BIOPSY  11/16/2022   Procedure: BRONCHIAL BIOPSIES;  Surgeon: Leslye Peer, MD;  Location: [facility] ENDOSCOPY;  Service: Pulmonary;;  . BRONCHIAL BRUSHINGS  11/16/2022   Procedure: BRONCHIAL BRUSHINGS;  Surgeon: Leslye Peer, MD;  Location: [facility] ENDOSCOPY;  Service: Pulmonary;;  . BRONCHIAL NEEDLE ASPIRATION BIOPSY  11/16/2022   Procedure: BRONCHIAL NEEDLE ASPIRATION BIOPSIES;  Surgeon: Leslye Peer, MD;  Location: [facility] ENDOSCOPY;  Service: Pulmonary;;  . CATARACT EXTRACTION     right eye  . COLONOSCOPY    . COLONOSCOPY WITH PROPOFOL N/A 09/12/2020   Procedure: COLONOSCOPY WITH PROPOFOL;  Surgeon: Rachael Fee, MD;  Location: [facility] ENDOSCOPY;  Service: Endoscopy;  Laterality: N/A;  . ENUCLEATION  2001   "right"  . ESOPHAGOGASTRODUODENOSCOPY (EGD) WITH PROPOFOL N/A 04/21/2012   Procedure: ESOPHAGOGASTRODUODENOSCOPY (EGD) WITH PROPOFOL;  Surgeon: Rachael Fee, MD;  Location: [facility] ENDOSCOPY;  Service: Endoscopy;   Laterality: N/A;  . ESOPHAGOGASTRODUODENOSCOPY (EGD) WITH PROPOFOL N/A 09/12/2020   Procedure: ESOPHAGOGASTRODUODENOSCOPY (EGD) WITH PROPOFOL;  Surgeon: Rachael Fee, MD;  Location: [facility] ENDOSCOPY;  Service: Endoscopy;  Laterality: N/A;  . FIDUCIAL MARKER PLACEMENT  11/16/2022   Procedure: FIDUCIAL MARKER PLACEMENT;  Surgeon: Leslye Peer, MD;  Location: [facility] ENDOSCOPY;  Service: Pulmonary;;  . HEMOSTASIS CONTROL  11/16/2022   Procedure: HEMOSTASIS CONTROL;  Surgeon: Leslye Peer, MD;  Location: [facility] ENDOSCOPY;  Service: Pulmonary;;  . 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 IR FLUORO GUIDE CV LINE RIGHT  07/11/2019  . IR FLUORO GUIDE CV LINE RIGHT  10/20/2019  . IR FLUORO GUIDE CV LINE RIGHT  05/31/2020  . IR FLUORO GUIDE CV LINE RIGHT  03/29/2023  . IR PTA VENOUS EXCEPT DIALYSIS CIRCUIT  05/31/2020  . IR RADIOLOGY PERIPHERAL GUIDED IV START  07/11/2019  . IR THORACENTESIS ASP PLEURAL SPACE W/IMG GUIDE  04/12/2023  . IR US GUIDE VASC ACCESS RIGHT  07/11/2019  . IR US GUIDE VASC ACCESS RIGHT  07/11/2019  . IR US GUIDE VASC ACCESS RIGHT  07/11/2019  . IR VENOCAVAGRAM IVC  05/31/2020  . IR VENOCAVAGRAM IVC  03/29/2023  . KIDNEY TRANSPLANT  1994; 1999; 2005   "right"  . MULTIPLE TOOTH EXTRACTIONS    . POLYPECTOMY  09/12/2020   Procedure: POLYPECTOMY;  Surgeon: Rachael Fee, MD;  Location: [facility] ENDOSCOPY;  Service: Endoscopy;;  . RIGHT HEART CATH N/A 03/23/2019   Procedure: RIGHT HEART CATH;  Surgeon: Dolores Patty, MD;  Location: [facility] INVASIVE CV LAB;  Service: Cardiovascular;  Laterality: N/A;  . TONSILLECTOMY    . TOTAL NEPHRECTOMY  1988?; 1994; 2005  . VIDEO BRONCHOSCOPY WITH RADIAL ENDOBRONCHIAL ULTRASOUND  11/16/2022   Procedure: VIDEO BRONCHOSCOPY WITH RADIAL ENDOBRONCHIAL ULTRASOUND;  Surgeon: Leslye Peer, MD;  Location: [facility] ENDOSCOPY;  Service: Pulmonary;;     reports that she has quit smoking. Her smoking use included  cigarettes. She has a 52.5 pack-year smoking history. She has never been exposed to tobacco smoke. She has never used smokeless tobacco. She reports that she does not currently use alcohol. She reports current drug use. Frequency: 1.00 time per week. Drug: Marijuana.  Family History  Problem Relation Age of Onset  . Glaucoma Mother   . Pancreatic cancer Father   . Multiple sclerosis Brother   . Diabetes Maternal Uncle   . Hypertension Maternal Grandmother   . Breast cancer Neg Hx     Prior to Admission medications   Medication Sig Start Date End Date Taking? Authorizing Provider  allopurinol (ZYLOPRIM) 100 MG tablet Take 0.5 tablets (50 mg total) by mouth 2 (two) times a week. Patient taking differently: Take 100 mg by mouth daily in the afternoon. 12/24/22  Yes Tawkaliyar, Roya, DO  amiodarone (PACERONE) 200 MG tablet Take 0.5 tablets (100 mg  total) by mouth daily. 07/03/22  Yes Eustace Pen, PA-C  apixaban (ELIQUIS) 2.5 MG TABS tablet Take 1 tablet (2.5 mg total) by mouth 2 (two) times daily. Okay to restart this medication on 11/18/2022 11/16/22  Yes Leslye Peer, MD  Colchicine 0.6 MG CAPS Take 0.6 mg by mouth daily as needed (gout flare up). 01/12/22  Yes Ivonne Andrew, NP  cyclobenzaprine (FLEXERIL) 5 MG tablet Take 1 tablet (5 mg total) by mouth at bedtime as needed. 08/21/22  Yes Rice, Jamesetta Orleans, MD  dexamethasone (DECADRON) 4 MG tablet Take 2 tablets (8mg ) by mouth daily starting the day after carboplatin for 3 days. Take with food 01/20/23  Yes Pasam, Avinash, MD  diphenhydrAMINE (BENADRYL) 25 mg capsule Take 1 capsule (25 mg total) by mouth every 8 (eight) hours as needed. 07/21/19  Yes Kallie Locks, FNP  famotidine (PEPCID) 20 MG tablet Take 1 tablet (20 mg total) by mouth 2 (two) times daily. 03/15/23  Yes Ivonne Andrew, NP  fluticasone-salmeterol (ADVAIR HFA) 130-86 MCG/ACT inhaler Inhale 2 puffs into the lungs 2 (two) times daily. 04/28/21  Yes Martina Sinner, MD  gabapentin (NEURONTIN) 100 MG capsule Take 100 mg by mouth 3 (three) times daily.   Yes [provider]  Lactulose 20 GM/30ML SOLN Take 30 mLs (20 g total) by mouth 2 (two) times daily as needed (constipation). 03/01/23  Yes Ghimire, Lyndel Safe, MD  midodrine (PROAMATINE) 10 MG tablet TAKE 1.5 TABLETS (15 MG TOTAL) BY MOUTH 3 (THREE) TIMES DAILY. NEEDS FOLLOW UP APPOINTMENT FOR ANYMORE REFILLS Patient taking differently: Take 20 mg by mouth 3 (three) times daily. NEEDS FOLLOW UP APPOINTMENT FOR ANYMORE REFILLS 01/18/23  Yes Bensimhon, Bevelyn Buckles, MD  omeprazole (PRILOSEC) 40 MG capsule TAKE 1 CAPSULE (40 MG TOTAL) BY MOUTH DAILY. 01/15/23  Yes Meryl Dare, MD  oxyCODONE-acetaminophen (PERCOCET) 10-325 MG tablet Take 1 tablet by mouth 4 (four) times daily as needed for pain. 09/08/19  Yes [provider]  prochlorperazine (COMPAZINE) 10 MG tablet Take 10 mg by mouth every 6 (six) hours as needed. 04/10/23  Yes [provider]  sertraline (ZOLOFT) 25 MG tablet Take 1 tablet (25 mg total) by mouth daily. 01/08/23  Yes Ivonne Andrew, NP  VELPHORO 500 MG chewable tablet Chew 500 mg by mouth See admin instructions. Take one tablet by mouth three times a daily with meals and snakes per patient   Yes [provider]  AURYXIA 1 GM 210 MG(Fe) tablet Take 420 mg by mouth 3 (three) times daily. Patient not taking: Reported on 04/11/2023    [provider]  calcium-vitamin D (OSCAL WITH D) 500-5 MG-MCG tablet Take 1 tablet by mouth 2 (two) times daily. Patient not taking: Reported on 04/11/2023 03/21/23   Arnetha Courser, MD  dorzolamide-timolol (COSOPT) 2-0.5 % ophthalmic solution Place 1 drop into the left eye every evening. 12/13/21   [provider]  metoCLOPramide (REGLAN) 10 MG tablet Take 1 tablet (10 mg total) by mouth 3 (three) times daily before meals. Patient taking differently: Take 10 mg by mouth daily in the afternoon. 12/02/22   Meryl Dare, MD   ondansetron (ZOFRAN) 8 MG tablet Take 1 tablet (8 mg total) by mouth every 8 (eight) hours as needed for nausea or vomiting. Start on the third day after carboplatin. Patient not taking: Reported on 04/17/2023 01/20/23   Meryl Crutch, MD    Physical Exam: Vitals:   04/17/23 2249 04/17/23 2300 04/17/23  2315 04/17/23 2330  BP:  (!) 82/54 (!) 84/53 94/66  Pulse:  (!) 106 (!) 103 (!) 143  Resp:  (!) 31 (!) 22 (!) 24  Temp: 99 F (37.2 C)     TempSrc: Oral     SpO2:  100% 100% 100%  Weight:      Height:       Physical Exam ***    Labs on Admission: I have personally reviewed the patients's labs and imaging studies.  Assessment/Plan Principal Problem:   Pleural effusion    ***   Admission status: Inpatient Progressive  Certification: The appropriate patient status for this patient is INPATIENT. Inpatient status is judged to be reasonable and necessary in order to provide the required intensity of service to ensure the patient's safety. The patient's presenting symptoms, physical exam findings, and initial radiographic and laboratory data in the context of their chronic comorbidities is felt to place them at high risk for further clinical deterioration. Furthermore, it is not anticipated that the patient will be medically stable for discharge from the hospital within 2 midnights of admission.   * I certify that at the point of admission it is my clinical judgment that the patient will require inpatient hospital care spanning beyond 2 midnights from the point of admission due to high intensity of service, high risk for further deterioration and high frequency of surveillance required.Alan Mulder MD [facility] If 7PM-7AM, please contact night-coverage [email or web address]  04/17/2023, 11:58 PM

## 2023-04-18 ENCOUNTER — Inpatient Hospital Stay (HOSPITAL_COMMUNITY)

## 2023-04-18 DIAGNOSIS — A419 Sepsis, unspecified organism: Principal | ICD-10-CM

## 2023-04-18 DIAGNOSIS — D61810 Antineoplastic chemotherapy induced pancytopenia: Secondary | ICD-10-CM | POA: Diagnosis present

## 2023-04-18 DIAGNOSIS — J90 Pleural effusion, not elsewhere classified: Secondary | ICD-10-CM | POA: Diagnosis not present

## 2023-04-18 DIAGNOSIS — R000 Tachycardia, unspecified: Secondary | ICD-10-CM

## 2023-04-18 DIAGNOSIS — R6521 Severe sepsis with septic shock: Secondary | ICD-10-CM | POA: Diagnosis not present

## 2023-04-18 DIAGNOSIS — J9601 Acute respiratory failure with hypoxia: Secondary | ICD-10-CM | POA: Diagnosis not present

## 2023-04-18 LAB — CBC WITH DIFFERENTIAL/PLATELET
Abs Immature Granulocytes: 0.12 10*3/uL — ABNORMAL HIGH (ref 0.00–0.07)
Basophils Absolute: 0 10*3/uL (ref 0.0–0.1)
Basophils Relative: 1 %
Eosinophils Absolute: 0 10*3/uL (ref 0.0–0.5)
Eosinophils Relative: 0 %
HCT: 25.6 % — ABNORMAL LOW (ref 36.0–46.0)
Hemoglobin: 8.3 g/dL — ABNORMAL LOW (ref 12.0–15.0)
Immature Granulocytes: 7 %
Lymphocytes Relative: 36 %
Lymphs Abs: 0.7 10*3/uL (ref 0.7–4.0)
MCH: 30.2 pg (ref 26.0–34.0)
MCHC: 32.4 g/dL (ref 30.0–36.0)
MCV: 93.1 fL (ref 80.0–100.0)
Monocytes Absolute: 0.4 10*3/uL (ref 0.1–1.0)
Monocytes Relative: 22 %
Neutro Abs: 0.6 10*3/uL — ABNORMAL LOW (ref 1.7–7.7)
Neutrophils Relative %: 34 %
Platelets: 50 10*3/uL — ABNORMAL LOW (ref 150–400)
RBC: 2.75 MIL/uL — ABNORMAL LOW (ref 3.87–5.11)
RDW: 15.7 % — ABNORMAL HIGH (ref 11.5–15.5)
WBC: 1.8 10*3/uL — ABNORMAL LOW (ref 4.0–10.5)
nRBC: 0 % (ref 0.0–0.2)

## 2023-04-18 LAB — COMPREHENSIVE METABOLIC PANEL
ALT: 30 U/L (ref 0–44)
AST: 28 U/L (ref 15–41)
Albumin: 3 g/dL — ABNORMAL LOW (ref 3.5–5.0)
Alkaline Phosphatase: 240 U/L — ABNORMAL HIGH (ref 38–126)
Anion gap: 16 — ABNORMAL HIGH (ref 5–15)
BUN: 13 mg/dL (ref 6–20)
CO2: 29 mmol/L (ref 22–32)
Calcium: 8.7 mg/dL — ABNORMAL LOW (ref 8.9–10.3)
Chloride: 94 mmol/L — ABNORMAL LOW (ref 98–111)
Creatinine, Ser: 2.58 mg/dL — ABNORMAL HIGH (ref 0.44–1.00)
GFR, Estimated: 21 mL/min — ABNORMAL LOW (ref >60)
Glucose, Bld: 88 mg/dL (ref 70–99)
Potassium: 3.2 mmol/L — ABNORMAL LOW (ref 3.5–5.1)
Sodium: 139 mmol/L (ref 135–145)
Total Bilirubin: 1 mg/dL (ref 0.0–1.2)
Total Protein: 5.8 g/dL — ABNORMAL LOW (ref 6.5–8.1)

## 2023-04-18 LAB — PROTIME-INR
INR: 1 (ref 0.8–1.2)
Prothrombin Time: 13.2 s (ref 11.4–15.2)

## 2023-04-18 LAB — PROCALCITONIN: Procalcitonin: 2.78 ng/mL

## 2023-04-18 LAB — BODY FLUID CELL COUNT WITH DIFFERENTIAL
Eos, Fluid: 0 %
Lymphs, Fluid: 95 %
Monocyte-Macrophage-Serous Fluid: 4 % — ABNORMAL LOW (ref 50–90)
Neutrophil Count, Fluid: 1 % (ref 0–25)
Total Nucleated Cell Count, Fluid: 206 uL (ref 0–1000)

## 2023-04-18 LAB — MRSA NEXT GEN BY PCR, NASAL: MRSA by PCR Next Gen: NOT DETECTED

## 2023-04-18 LAB — CORTISOL: Cortisol, Plasma: 24.9 ug/dL

## 2023-04-18 LAB — LACTIC ACID, PLASMA: Lactic Acid, Venous: 2.4 mmol/L (ref 0.5–1.9)

## 2023-04-18 LAB — LACTATE DEHYDROGENASE, PLEURAL OR PERITONEAL FLUID: LD, Fluid: 145 U/L — ABNORMAL HIGH (ref 3–23)

## 2023-04-18 LAB — PROTEIN, PLEURAL OR PERITONEAL FLUID: Total protein, fluid: 3.1 g/dL

## 2023-04-18 LAB — GLUCOSE, PLEURAL OR PERITONEAL FLUID: Glucose, Fluid: 76 mg/dL

## 2023-04-18 LAB — GLUCOSE, CAPILLARY: Glucose-Capillary: 92 mg/dL (ref 70–99)

## 2023-04-18 MED ORDER — POTASSIUM CHLORIDE 10 MEQ/100ML IV SOLN
10.0000 meq | INTRAVENOUS | Status: AC
Start: 2023-04-18 — End: 2023-04-18
  Administered 2023-04-18 (×3): 10 meq via INTRAVENOUS
  Filled 2023-04-18 (×3): qty 100

## 2023-04-18 MED ORDER — LACTATED RINGERS IV SOLN: INTRAVENOUS | Status: DC

## 2023-04-18 MED ORDER — VANCOMYCIN HCL IN DEXTROSE 1-5 GM/200ML-% IV SOLN
1000.0000 mg | Freq: Once | INTRAVENOUS | Status: AC
  Administered 2023-04-18: 1000 mg via INTRAVENOUS
  Filled 2023-04-18: qty 200

## 2023-04-18 MED ORDER — NOREPINEPHRINE 4 MG/250ML-% IV SOLN
2.0000 ug/min | INTRAVENOUS | Status: DC
  Administered 2023-04-18 – 2023-04-19 (×2): 2 ug/min via INTRAVENOUS
  Filled 2023-04-18 (×2): qty 250

## 2023-04-18 MED ORDER — SODIUM CHLORIDE 0.9 % IV BOLUS
250.0000 mL | Freq: Once | INTRAVENOUS | Status: AC
  Administered 2023-04-18: 250 mL via INTRAVENOUS

## 2023-04-18 MED ORDER — SODIUM CHLORIDE 0.9% FLUSH
10.0000 mL | Freq: Two times a day (BID) | INTRAVENOUS | Status: DC
  Administered 2023-04-18 – 2023-04-20 (×4): 10 mL
  Administered 2023-04-21: 20 mL

## 2023-04-18 MED ORDER — MIDODRINE HCL 5 MG PO TABS
15.0000 mg | ORAL_TABLET | Freq: Three times a day (TID) | ORAL | Status: DC
  Administered 2023-04-18 – 2023-04-20 (×6): 15 mg via ORAL
  Filled 2023-04-18 (×5): qty 3

## 2023-04-18 MED ORDER — SODIUM CHLORIDE 0.9% FLUSH: 10.0000 mL | INTRAVENOUS | Status: DC | PRN

## 2023-04-18 MED ORDER — SODIUM CHLORIDE 0.9% FLUSH
10.0000 mL | Freq: Three times a day (TID) | INTRAVENOUS | Status: DC
  Administered 2023-04-18 – 2023-04-21 (×9): 10 mL via INTRAPLEURAL

## 2023-04-18 MED ORDER — CHLORHEXIDINE GLUCONATE CLOTH 2 % EX PADS
6.0000 | MEDICATED_PAD | Freq: Every day | CUTANEOUS | Status: DC
  Administered 2023-04-18 – 2023-04-20 (×3): 6 via TOPICAL

## 2023-04-18 MED ORDER — ALBUMIN HUMAN 25 % IV SOLN
12.5000 g | Freq: Once | INTRAVENOUS | Status: AC
  Administered 2023-04-18: 12.5 g via INTRAVENOUS
  Filled 2023-04-18: qty 50

## 2023-04-18 MED ORDER — ALBUMIN HUMAN 25 % IV SOLN
25.0000 g | Freq: Four times a day (QID) | INTRAVENOUS | Status: AC
  Administered 2023-04-18 – 2023-04-19 (×4): 25 g via INTRAVENOUS
  Filled 2023-04-18 (×4): qty 100

## 2023-04-18 MED ORDER — SODIUM CHLORIDE 0.9 % IV SOLN
250.0000 mL | INTRAVENOUS | Status: AC
  Administered 2023-04-18: 250 mL via INTRAVENOUS

## 2023-04-18 MED ORDER — SODIUM CHLORIDE 0.9 % IV SOLN
1.0000 g | INTRAVENOUS | Status: DC
  Administered 2023-04-18 – 2023-04-20 (×3): 1 g via INTRAVENOUS
  Filled 2023-04-18 (×5): qty 10

## 2023-04-18 MED ORDER — MORPHINE SULFATE (PF) 2 MG/ML IV SOLN
1.0000 mg | Freq: Once | INTRAVENOUS | Status: AC
  Administered 2023-04-18: 1 mg via INTRAVENOUS
  Filled 2023-04-18: qty 1

## 2023-04-18 MED ORDER — GERHARDT'S BUTT CREAM
TOPICAL_CREAM | Freq: Two times a day (BID) | CUTANEOUS | Status: DC | PRN
  Filled 2023-04-18: qty 60

## 2023-04-18 MED ORDER — HYDROCORTISONE SOD SUC (PF) 100 MG IJ SOLR
100.0000 mg | Freq: Three times a day (TID) | INTRAMUSCULAR | Status: DC
  Administered 2023-04-18 – 2023-04-19 (×3): 100 mg via INTRAVENOUS
  Filled 2023-04-18 (×4): qty 2

## 2023-04-18 MED ORDER — HYDROMORPHONE HCL 1 MG/ML IJ SOLN
0.5000 mg | INTRAMUSCULAR | Status: DC | PRN
  Administered 2023-04-18: 1 mg via INTRAVENOUS
  Administered 2023-04-18: 0.5 mg via INTRAVENOUS
  Filled 2023-04-18 (×2): qty 1

## 2023-04-18 MED ORDER — ACETAMINOPHEN 10 MG/ML IV SOLN
1000.0000 mg | Freq: Four times a day (QID) | INTRAVENOUS | Status: DC
  Filled 2023-04-18 (×2): qty 100

## 2023-04-18 MED ORDER — HYDROMORPHONE HCL 1 MG/ML IJ SOLN
0.5000 mg | INTRAMUSCULAR | Status: DC | PRN
  Administered 2023-04-18 – 2023-04-21 (×8): 0.5 mg via INTRAVENOUS
  Filled 2023-04-18 (×8): qty 0.5

## 2023-04-18 MED ORDER — VANCOMYCIN HCL 500 MG/100ML IV SOLN
500.0000 mg | INTRAVENOUS | Status: AC
  Administered 2023-04-20: 500 mg via INTRAVENOUS
  Filled 2023-04-18: qty 100

## 2023-04-18 MED ORDER — POTASSIUM CHLORIDE CRYS ER 20 MEQ PO TBCR
30.0000 meq | EXTENDED_RELEASE_TABLET | Freq: Two times a day (BID) | ORAL | Status: AC
  Administered 2023-04-18: 30 meq via ORAL
  Filled 2023-04-18: qty 1

## 2023-04-18 NOTE — Procedures (Signed)
 Insertion of Chest Tube Procedure Note  Tina Watkins  [id number]  Jul 03, 1965  Date:04/18/23  Time:2:21 PM    Provider Performing: Lorin Glass   Procedure: Pleural Catheter Insertion w/ Imaging Guidance (29562)  Indication(s) Effusion  Consent Risks of the procedure as well as the alternatives and risks of each were explained to the patient and/or caregiver.  Consent for the procedure was obtained and is signed in the bedside chart  Anesthesia Topical only with 1% lidocaine    Time Out Verified patient identification, verified procedure, site/side was marked, verified correct patient position, special equipment/implants available, medications/allergies/relevant history reviewed, required imaging and test results available.   Sterile Technique Maximal sterile technique including full sterile barrier drape, hand hygiene, sterile gown, sterile gloves, mask, hair covering, sterile ultrasound probe cover (if used).   Procedure Description Ultrasound used to identify appropriate pleural anatomy for placement and overlying skin marked. Area of placement cleaned and draped in sterile fashion.  A 14 French pigtail pleural catheter was placed into the left pleural space using Seldinger technique. Appropriate return of fluid was obtained.  The tube was connected to atrium and placed on -20 cm H2O wall suction.   Complications/Tolerance None; patient tolerated the procedure well. Chest X-ray is ordered to verify placement.   EBL Minimal  Specimen(s) fluid\

## 2023-04-18 NOTE — Progress Notes (Addendum)
 04/18/2023 Now hypotensive Appears uncomfortable on BIPAP She did have benefit to thora last week I think bring to ICU, place pigtail:  Provided that fluid drainage helps breathing, then  If pleural apposition achieved, attempt talc pleurodesis If lung entrapped which I'm worried about, we can consider palliative pleurX  Can attempt abx, levophed for MAP 65 Has had number of rounds of dexamethasone so stress steroids reasonable  Overall trajectory, imaging concerning for approaching EOL. She and fiance made aware of this but unwilling to change GOC at present. Really hoping palliative can help provide some guidance.  Additional 45 min cc time  Tina Watkins

## 2023-04-18 NOTE — Progress Notes (Signed)
 RT note. Patient placed on bipap at this time due to ^ WOB, and desat.  Patient on the following settings.  RT will continue to monitor.    04/18/23 0932  Vent Select  Invasive or Noninvasive Noninvasive  Adult Vent Y  Vent start date 04/18/23  Vent start time 0930  Adult Ventilator Settings  Vent Type Servo U  Vent Mode (S)  BIPAP  Set Rate 12 bmp  FiO2 (%) 100 %  IPAP 18 cmH20  EPAP 6 cmH20  Pressure Control 12 cmH20  PEEP 6 cmH20  Adult Ventilator Measurements  Peak Airway Pressure 16 L/min  Resp Rate Spontaneous 26 br/min  Resp Rate Total 26 br/min  Exhaled Vt 547 mL  Measured Ve 13.8 L  I:E Ratio Measured 1:1.7  Auto PEEP 0 cmH20  Total PEEP 6 cmH20  SpO2 97 %  Adult Ventilator Alarms  Alarms On Y  Ve High Alarm 25 L/min  Ve Low Alarm 5 L/min  Resp Rate High Alarm 45 br/min  Resp Rate Low Alarm 10  PEEP Low Alarm 4 cmH2O  Press High Alarm 40 cmH2O  T Apnea 20 sec(s)

## 2023-04-18 NOTE — Consult Note (Signed)
 NAME:  Tina Watkins, MRN:  [id number], DOB:  1965/09/11, LOS: 1 ADMISSION DATE:  04/17/2023, CONSULTATION DATE:  04/18/2023 REFERRING MD:  Alan Mulder, MD, CHIEF COMPLAINT:  SOB   History of Present Illness:  58 y.o. female with medical history significant of metastatic lung adenocarcinoma, suspected pancreatic adenocarcinoma, ESRD on hemodialysis, CHF who presented to the ed due shortness of breath.  Patient went to dialysis today and was hypotensive so was unable to complete her session.  She was transported to the emergency department where she found to be afebrile and hypotensive with systolics in the 80s.  Chest ray was obtained which showed a large left pleural effusion with near complete opacification of the left lung.  Labs showed respiratory viral panel negative, potassium 3.0, creatinine 1.6, troponin and 29, 30.  Lactic acid 2.2.  She had a Thoracentesis on Monday for 550 cc fluid.  She felt better and was d/c home. The patient was sen in ED room #6.  Her fiancee was at the bedside.  I explained findings of CT scan to both.  Pertinent  Medical History  Metastatic Adenocarcinoma Lung  Significant Hospital Events: Including procedures, antibiotic start and stop dates in addition to other pertinent events   3/16: Admitted for SOB  Interim History / Subjective:  N/a  Objective   Blood pressure 94/66, pulse (!) 143, temperature 99 F (37.2 C), temperature source Oral, resp. rate (!) 24, height 4\' 11"  (1.499 m), weight 47.8 kg, SpO2 100%.        Intake/Output Summary (Last 24 hours) at 04/18/2023 0134 Last data filed at 04/17/2023 2108 Gross per 24 hour  Intake 100.34 ml  Output --  Net 100.34 ml   Filed Weights   04/17/23 1619  Weight: 47.8 kg    Examination: General: alert with mild resp distress HENT: perrla no icterus eomi Lungs: few rhinchi, left side diminished Cardiovascular: reg s1s2 and tachy Abdomen: sogt nt nd bs pos Extremities: no cyanosis, clubbing  or edema Neuro: AAOx 3 CN II to XII grossly intact   Resolved Hospital Problem list   N/a  Assessment & Plan:  SOB-from lung cancer/atelectasis and pleural effusion Left Pleural effusion-interspersed with lung cancer, not enough for thoracentesis, nor will a thoracentesis help her at this time. Left atelectasis-total left lung collapse more so from likely enlargement and worsening lung cancer, could there be a mucous plug, possible  but less likely.  Right side with extensive popcorn lesions suggesting significant metastatic disease.  Mediastinum also extensive cancer One may consider palliative bronchoscopy to see if left lung can be opened  Lung cancer with mets- end stage, suggest Palliation and hospice Hypoxia-from above issues, high flow can be started and she will not tolerate BiPAP Tachycardia-CTA to make sure she does not have a PE on top of above ESRD on HD by history  I had a candid talk with patient and Steffanie Rainwater, I gave her the results of the CT scan and told her that her prognosis was not good at this time. She can be admitted to Progressive care or step down, not a candidate for ICU level of care at this time. Best Practice (right click and "Reselect all SmartList Selections" daily)   Per protocols CBC: Recent Labs  Lab 04/11/23 1645 04/12/23 0336 04/17/23 1724  WBC 2.9* 3.6* 1.3*  NEUTROABS  --   --  0.4*  HGB 7.0* 8.7* 8.1*  HCT 22.4* 26.6* 25.3*  MCV 97.8 92.7 93.7  PLT 106* 112* 40*  Basic Metabolic Panel: Recent Labs  Lab 04/11/23 0825 04/12/23 0336 04/17/23 1724  NA  --  135 139  K  --  3.6 3.0*  CL  --  94* 96*  CO2  --  28 27  GLUCOSE  --  101* 84  BUN  --  9 9  CREATININE  --  1.59* 1.62*  CALCIUM  --  9.3 8.1*  MG 2.1  --   --   PHOS 4.9*  --   --    GFR: Estimated Creatinine Clearance: 26.8 mL/min (A) (by C-G formula based on SCr of 1.62 mg/dL (H)). Recent Labs  Lab 04/11/23 1645 04/12/23 0336 04/17/23 1724 04/17/23 2038  WBC 2.9*  3.6* 1.3*  --   LATICACIDVEN  --   --   --  2.2*    Liver Function Tests: Recent Labs  Lab 04/12/23 0336 04/17/23 1724  AST 46* 32  ALT 44 33  ALKPHOS 274* 257*  BILITOT 1.0 0.5  PROT 6.3* 5.5*  ALBUMIN 3.4* 3.0*   No results for input(s): "LIPASE", "AMYLASE" in the last 168 hours. No results for input(s): "AMMONIA" in the last 168 hours.  ABG    Component Value Date/Time   PHART 7.425 03/23/2019 1020   PCO2ART 32.9 03/23/2019 1020   PO2ART 131.0 (H) 03/23/2019 1020   HCO3 22.8 03/23/2019 1034   TCO2 24 11/16/2022 0636   ACIDBASEDEF 2.0 03/23/2019 1034   O2SAT 77.0 03/23/2019 1034     Coagulation Profile: No results for input(s): "INR", "PROTIME" in the last 168 hours.  Cardiac Enzymes: No results for input(s): "CKTOTAL", "CKMB", "CKMBINDEX", "TROPONINI" in the last 168 hours.  HbA1C: Hemoglobin A1C  Date/Time Value Ref Range Status  07/14/2021 08:49 AM 4.0 4.0 - 5.6 % Final  05/08/2019 01:47 PM 4.8 4.0 - 5.6 % Final   HbA1c, POC (prediabetic range)  Date/Time Value Ref Range Status  07/14/2021 08:49 AM 4.0 (A) 5.7 - 6.4 % Final   HbA1c, POC (controlled diabetic range)  Date/Time Value Ref Range Status  07/14/2021 08:49 AM 4.0 0.0 - 7.0 % Final   HbA1c POC (<> result, manual entry)  Date/Time Value Ref Range Status  07/14/2021 08:49 AM 4.0 4.0 - 5.6 % Final   Hgb A1c MFr Bld  Date/Time Value Ref Range Status  12/10/2011 11:45 PM 5.5 <5.7 % Final    Comment:    (NOTE)                                                                       According to the ADA Clinical Practice Recommendations for 2011, when HbA1c is used as a screening test:  >=6.5%   Diagnostic of Diabetes Mellitus           (if abnormal result is confirmed) 5.7-6.4%   Increased risk of developing Diabetes Mellitus References:Diagnosis and Classification of Diabetes Mellitus,Diabetes Care,2011,34(Suppl 1):S62-S69 and Standards of Medical Care in         Diabetes - 2011,Diabetes  Care,2011,34 (Suppl 1):S11-S61.    CBG: No results for input(s): "GLUCAP" in the last 168 hours.  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), Dysrhythmia, E coli bacteremia (06/18/2011), Elevated LDL cholesterol level (12/2018), ESRD (end stage renal disease) (HCC) (06/12/2011), Fibromyalgia, Gastroparesis, GERD (gastroesophageal reflux disease), Glaucoma, Gout, H/O carpal tunnel syndrome, Headache (10/2019), Headache(784.0), Herpes genitalia (1994), History of blood transfusion, History of COVID-19, most recently 09/2022 (05/09/2019), History of deep vein thrombosis (DVT) of right lower extremity (11/20/2017), History of stomach ulcers, Hypotension, Iron deficiency anemia, New onset a-fib (HCC), Osteopenia, Peripheral neuropathy (11/2018), Pneumonia, Pressure ulcer of sacral region, stage 1 (07/2019), Seizures (HCC) (1994), Spinal stenosis in cervical region, Stroke [facility]), Stroke (HCC) (~ 1999; 2001), and Vitamin D deficiency (12/2018).   Surgical History:   Past Surgical History:  Procedure Laterality Date   ANTERIOR CERVICAL DECOMP/DISCECTOMY FUSION N/A 01/08/2015   Procedure: Anterior Cervical Three-Four/Four-Five Decompression/Diskectomy/Fusion;  Surgeon: Hilda Lias, MD;  Location: [facility] NEURO ORS;  Service: Neurosurgery;  Laterality: N/A;  C3-4 C4-5 Anterior cervical decompression/diskectomy/fusion   APPENDECTOMY  ~ 2004   BIOPSY  09/12/2020   Procedure: BIOPSY;  Surgeon: Rachael Fee, MD;  Location: [facility] ENDOSCOPY;  Service: Endoscopy;;   BRONCHIAL BIOPSY  11/16/2022   Procedure: BRONCHIAL BIOPSIES;  Surgeon: Leslye Peer, MD;  Location[facility] ENDOSCOPY;   Service: Pulmonary;;   BRONCHIAL BRUSHINGS  11/16/2022   Procedure: BRONCHIAL BRUSHINGS;  Surgeon: Leslye Peer, MD;  Location: [facility] ENDOSCOPY;  Service: Pulmonary;;   BRONCHIAL NEEDLE ASPIRATION BIOPSY  11/16/2022   Procedure: BRONCHIAL NEEDLE ASPIRATION BIOPSIES;  Surgeon: Leslye Peer, MD;  Location: [facility] ENDOSCOPY;  Service: Pulmonary;;   CATARACT EXTRACTION     right eye   COLONOSCOPY     COLONOSCOPY WITH PROPOFOL N/A 09/12/2020   Procedure: COLONOSCOPY WITH PROPOFOL;  Surgeon: Rachael Fee, MD;  Location: [facility] ENDOSCOPY;  Service: Endoscopy;  Laterality: N/A;   ENUCLEATION  2001   "right"   ESOPHAGOGASTRODUODENOSCOPY (EGD) WITH PROPOFOL N/A 04/21/2012   Procedure: ESOPHAGOGASTRODUODENOSCOPY (EGD) WITH PROPOFOL;  Surgeon: Rachael Fee, MD;  Location: [facility] ENDOSCOPY;  Service: Endoscopy;  Laterality: N/A;   ESOPHAGOGASTRODUODENOSCOPY (EGD) WITH PROPOFOL N/A 09/12/2020   Procedure: ESOPHAGOGASTRODUODENOSCOPY (EGD) WITH PROPOFOL;  Surgeon: Rachael Fee, MD;  Location: [facility] ENDOSCOPY;  Service: Endoscopy;  Laterality: N/A;   FIDUCIAL MARKER PLACEMENT  11/16/2022   Procedure: FIDUCIAL MARKER PLACEMENT;  Surgeon: Leslye Peer, MD;  Location: [facility] ENDOSCOPY;  Service: Pulmonary;;   HEMOSTASIS CONTROL  11/16/2022   Procedure: HEMOSTASIS CONTROL;  Surgeon: Leslye Peer, MD;  Location: [facility] ENDOSCOPY;  Service: Pulmonary;;   INSERTION OF DIALYSIS CATHETER  1988   "AV graft LUA & LFA; LUA worked for 1 day; LFA never worked"   IR FLUORO GUIDE CV LINE RIGHT  07/11/2019   IR FLUORO GUIDE CV LINE RIGHT  10/20/2019   IR FLUORO GUIDE CV LINE RIGHT  05/31/2020   IR FLUORO GUIDE CV LINE RIGHT  03/29/2023   IR PTA VENOUS EXCEPT DIALYSIS CIRCUIT  05/31/2020   IR RADIOLOGY PERIPHERAL GUIDED IV START  07/11/2019   IR THORACENTESIS ASP PLEURAL SPACE W/IMG GUIDE  04/12/2023   IR US GUIDE VASC ACCESS RIGHT  07/11/2019   IR US GUIDE VASC ACCESS RIGHT  07/11/2019   IR US GUIDE VASC ACCESS RIGHT  07/11/2019   IR  VENOCAVAGRAM IVC  05/31/2020   IR VENOCAVAGRAM IVC  03/29/2023   KIDNEY TRANSPLANT  1994; 1999; 2005   "  right"   MULTIPLE TOOTH EXTRACTIONS     POLYPECTOMY  09/12/2020   Procedure: POLYPECTOMY;  Surgeon: Rachael Fee, MD;  Location: [facility] ENDOSCOPY;  Service: Endoscopy;;   RIGHT HEART CATH N/A 03/23/2019   Procedure: RIGHT HEART CATH;  Surgeon: Dolores Patty, MD;  Location: [facility] INVASIVE CV LAB;  Service: Cardiovascular;  Laterality: N/A;   TONSILLECTOMY     TOTAL NEPHRECTOMY  1988?; 1994; 2005   VIDEO BRONCHOSCOPY WITH RADIAL ENDOBRONCHIAL ULTRASOUND  11/16/2022   Procedure: VIDEO BRONCHOSCOPY WITH RADIAL ENDOBRONCHIAL ULTRASOUND;  Surgeon: Leslye Peer, MD;  Location: [facility] ENDOSCOPY;  Service: Pulmonary;;     Social History:   reports that she has quit smoking. Her smoking use included cigarettes. She has a 52.5 pack-year smoking history. She has never been exposed to tobacco smoke. She has never used smokeless tobacco. She reports that she does not currently use alcohol. She reports current drug use. Frequency: 1.00 time per week. Drug: Marijuana.   Family History:  Her family history includes Diabetes in her maternal uncle; Glaucoma in her mother; Hypertension in her maternal grandmother; Multiple sclerosis in her brother; Pancreatic cancer in her father. There is no history of Breast cancer.   Allergies Allergies  Allergen Reactions   Levofloxacin Itching and Rash   Tape Other (See Comments)    "Certain surgical tapes peel off my skin"     Home Medications  Prior to Admission medications   Medication Sig Start Date End Date Taking? Authorizing Provider  allopurinol (ZYLOPRIM) 100 MG tablet Take 0.5 tablets (50 mg total) by mouth 2 (two) times a week. Patient taking differently: Take 100 mg by mouth daily in the afternoon. 12/24/22  Yes Tawkaliyar, Roya, DO  amiodarone (PACERONE) 200 MG tablet Take 0.5 tablets (100 mg total) by mouth daily. 07/03/22  Yes Eustace Pen, PA-C   apixaban (ELIQUIS) 2.5 MG TABS tablet Take 1 tablet (2.5 mg total) by mouth 2 (two) times daily. Okay to restart this medication on 11/18/2022 11/16/22  Yes Leslye Peer, MD  Colchicine 0.6 MG CAPS Take 0.6 mg by mouth daily as needed (gout flare up). 01/12/22  Yes Ivonne Andrew, NP  cyclobenzaprine (FLEXERIL) 5 MG tablet Take 1 tablet (5 mg total) by mouth at bedtime as needed. 08/21/22  Yes Rice, Jamesetta Orleans, MD  dexamethasone (DECADRON) 4 MG tablet Take 2 tablets (8mg ) by mouth daily starting the day after carboplatin for 3 days. Take with food 01/20/23  Yes Pasam, Avinash, MD  diphenhydrAMINE (BENADRYL) 25 mg capsule Take 1 capsule (25 mg total) by mouth every 8 (eight) hours as needed. 07/21/19  Yes Kallie Locks, FNP  famotidine (PEPCID) 20 MG tablet Take 1 tablet (20 mg total) by mouth 2 (two) times daily. 03/15/23  Yes Ivonne Andrew, NP  fluticasone-salmeterol (ADVAIR HFA) 413-24 MCG/ACT inhaler Inhale 2 puffs into the lungs 2 (two) times daily. 04/28/21  Yes Martina Sinner, MD  gabapentin (NEURONTIN) 100 MG capsule Take 100 mg by mouth 3 (three) times daily.   Yes [provider]  Lactulose 20 GM/30ML SOLN Take 30 mLs (20 g total) by mouth 2 (two) times daily as needed (constipation). 03/01/23  Yes Ghimire, Lyndel Safe, MD  midodrine (PROAMATINE) 10 MG tablet TAKE 1.5 TABLETS (15 MG TOTAL) BY MOUTH 3 (THREE) TIMES DAILY. NEEDS FOLLOW UP APPOINTMENT FOR ANYMORE REFILLS Patient taking differently: Take 20 mg by mouth 3 (three) times daily. NEEDS FOLLOW UP APPOINTMENT FOR ANYMORE REFILLS 01/18/23  Yes Bensimhon,  Bevelyn Buckles, MD  omeprazole (PRILOSEC) 40 MG capsule TAKE 1 CAPSULE (40 MG TOTAL) BY MOUTH DAILY. 01/15/23  Yes Meryl Dare, MD  oxyCODONE-acetaminophen (PERCOCET) 10-325 MG tablet Take 1 tablet by mouth 4 (four) times daily as needed for pain. 09/08/19  Yes [provider]  prochlorperazine (COMPAZINE) 10 MG tablet Take 10 mg by mouth every 6 (six) hours as  needed. 04/10/23  Yes [provider]  sertraline (ZOLOFT) 25 MG tablet Take 1 tablet (25 mg total) by mouth daily. 01/08/23  Yes Ivonne Andrew, NP  VELPHORO 500 MG chewable tablet Chew 500 mg by mouth See admin instructions. Take one tablet by mouth three times a daily with meals and snakes per patient   Yes [provider]  AURYXIA 1 GM 210 MG(Fe) tablet Take 420 mg by mouth 3 (three) times daily. Patient not taking: Reported on 04/11/2023    [provider]  calcium-vitamin D (OSCAL WITH D) 500-5 MG-MCG tablet Take 1 tablet by mouth 2 (two) times daily. Patient not taking: Reported on 04/11/2023 03/21/23   Arnetha Courser, MD  dorzolamide-timolol (COSOPT) 2-0.5 % ophthalmic solution Place 1 drop into the left eye every evening. 12/13/21   [provider]  metoCLOPramide (REGLAN) 10 MG tablet Take 1 tablet (10 mg total) by mouth 3 (three) times daily before meals. Patient taking differently: Take 10 mg by mouth daily in the afternoon. 12/02/22   Meryl Dare, MD  ondansetron (ZOFRAN) 8 MG tablet Take 1 tablet (8 mg total) by mouth every 8 (eight) hours as needed for nausea or vomiting. Start on the third day after carboplatin. Patient not taking: Reported on 04/17/2023 01/20/23   Meryl Crutch, MD     Critical care time: 6   The patient is critically ill with multiple organ system failure and requires high complexity decision making for assessment and support, frequent evaluation and titration of therapies, advanced monitoring, review of radiographic studies and interpretation of complex data.   Critical Care Time devoted to patient care services, exclusive of separately billable procedures, described in this note is 45 minutes.  Rozann Lesches, MD [facility] See Amion for pager  If no response to pager , please call [phone] until 7pm After 7:00 pm call Elink  [phone] 04/18/2023, 1:34 AM

## 2023-04-18 NOTE — Progress Notes (Signed)
   04/18/23 0222  Oxygen Therapy/Pulse Ox  O2 Device HHFNC  $ High Flow Nasal Cannula  Yes  O2 Therapy Oxygen humidified  Heater temperature 87.8 F (31 C) (adjusted per patient comfort. patient states doesnt want hot air)  O2 Flow Rate (L/min) (S)  45 L/min  FiO2 (%) 30 %  SpO2 98 %  Oxygen On/Off  Continued    Patient is on HHFNC at this time on above settings. Patient refuse NIV at this time stating that she does not want a mask on her face. Patient respirations are now low 30's oppose to 40's on initial assessment.   Lucile Didonato L. Katrinka Blazing, BS, RRT-ACCS, RCP

## 2023-04-18 NOTE — Progress Notes (Addendum)
 [facility] Renal Brief Note     Ms. Cantrall presented to the ED with CP and SOB. PMH includes ESRD on dialysis TTS and lung cancer currently undergoing chemotherapy. She was recently admitted for L pleural effusion and underwent thoracentesis 6 days ago with 550cc output. CXR in the ED showed L pleural effusion, left atelectasis with total lung collapse, and extensive popcorn lesions in the right lung. VS notable for hypotension, tachycardia, and tachypnea. She is currently a DNR/DNI and on BiPAP. Labs notable for K+ 3.2, CO2 29, BUN 13, Cr 2.58, WBC 1.8, Hgb 8.3, Plt 50. She did attempt dialysis Saturday, has not missed any dialysis lately. Reports shortness of breath acutely worsened over the past 24 hours. History is otherwise limited due to shortness of breath/BiPAP.  Physical exam: Gen: Ill appearing female on BiPAP Cardiac: Tachycardic, no murmur Pulm: On BiPAP, absent breath sounds on L, diffuse wheezing on R GI: Abdomen non-distended, +BS Extremities: No edema b/l lower extremities  No acute indications for dialysis today, and she would not tolerate HD at present. Nephrology will follow peripherally for goals of care and reassess tomorrow as needed.  Rogers Blocker, PA-C 04/18/2023, 12:24 PM  [facility] Pager: [phone]

## 2023-04-18 NOTE — ED Notes (Signed)
 Dorrell MD stated to hold off on moving patient until he talked to ICU. 2C nurse made aware.

## 2023-04-18 NOTE — Progress Notes (Addendum)
 Pharmacy Antibiotic Note  Tina Watkins is a 58 y.o. female admitted on 04/17/2023 with pneumonia and sepsis.  Pharmacy has been consulted for vancomycin and cefepime dosing.  Plan: Vancomycin 1000mg  IV x1 then 500mg  IV every HD.  Goal pre-HD level 15-25 mcg/mL. Cefepime 2g IV x1 then 1g Q24H.  Height: 4\' 11"  (149.9 cm) Weight: 47.8 kg (105 lb 6.1 oz) IBW/kg (Calculated) : 43.2  Temp (24hrs), Avg:98.3 F (36.8 C), Min:97.9 F (36.6 C), Max:99 F (37.2 C)  Recent Labs  Lab 04/11/23 1645 04/12/23 0336 04/17/23 1724 04/17/23 2038 04/18/23 0306  WBC 2.9* 3.6* 1.3*  --  1.8*  CREATININE  --  1.59* 1.62*  --  2.58*  LATICACIDVEN  --   --   --  2.2* 2.4*    Estimated Creatinine Clearance: 16.8 mL/min (A) (by C-G formula based on SCr of 2.58 mg/dL (H)).    Allergies  Allergen Reactions   Levofloxacin Itching and Rash   Tape Other (See Comments)    "Certain surgical tapes peel off my skin"   Thank you for allowing pharmacy to be a part of this patient's care.  Vernard Gambles, PharmD, BCPS  04/18/2023 4:48 AM

## 2023-04-18 NOTE — Progress Notes (Signed)
 PROGRESS NOTE    Tina Watkins  EXB:[id number] DOB: 17-Jun-1965 DOA: 04/17/2023 PCP: Ivonne Andrew, NP     Brief Narrative:  Patient is a 58 year old female with a history of metastatic lung cancer, possible pancreatic cancer, ESRD on dialysis, and CHF who came in with shortness of breath.  She had dialysis today and was hypertensive slowed was unable to complete her session.  Systolic blood pressure was in the 80s upon arrival to the emergency department.  She was discharge from the hospitalization rest 10 where she had thoracentesis for drainage of a large left pleural effusion.  She was tachycardic in the 130s.  CODE STATUS is partial DNR with refusal of intubation, chest compressions, and defibrillation only.  New events last 24 hours / Subjective: Pulmonology saw the patient.  She is on BiPAP.  Blood pressures in the 70s and 80s systolic.  She is short of breath.  She is in pain from chronic ulcers.  CCM was asked to see the patient again and because of her blood pressures they plan on starting Levophed and transferred to the ICU.  Assessment & Plan:   Principal Problem:   Acute on chronic respiratory failure (HCC) Active Problems:   Septic shock (HCC)   ESRD (end stage renal disease) (HCC)   Acute on chronic anemia   CHF (congestive heart failure) (HCC)   Metastatic adenocarcinoma to lung (HCC)   Pleural effusion   Antineoplastic chemotherapy induced pancytopenia (HCC)  She is currently on vancomycin and cefepime for HCAP coverage.  CCM was consulted again and plan to move for to the ICU for blood pressure control.  Light fluids started.  Continue NIPPV.  Nephrology consulted today.  Blood pressure not conducive for dialysis at this time.  DVT prophylaxis: SCDs Code Status: Partial DNR - no intubation, defib or CPR Family Communication: spoke with mother and fiancee Coming From: Home Disposition Plan: Unclear- very poor prognosis Barriers to Discharge: clinical  improvement  Consultants:  CCM- transferring to ICU Nephrology  Procedures:  none  Antimicrobials:  Anti-infectives (From admission, onward)    Start     Dose/Rate Route Frequency Ordered Stop   04/20/23 1200  vancomycin (VANCOREADY) IVPB 500 mg/100 mL        500 mg 100 mL/hr over 60 Minutes Intravenous Every T-Th-Sa (Hemodialysis) 04/18/23 0451     04/18/23 2200  ceFEPIme (MAXIPIME) 1 g in sodium chloride 0.9 % 100 mL IVPB        1 g 200 mL/hr over 30 Minutes Intravenous Every 24 hours 04/18/23 0451     04/18/23 0030  vancomycin (VANCOCIN) IVPB 1000 mg/200 mL premix        1,000 mg 200 mL/hr over 60 Minutes Intravenous  Once 04/18/23 0020 04/18/23 0408   04/17/23 1845  ceFEPIme (MAXIPIME) 2 g in sodium chloride 0.9 % 100 mL IVPB        2 g 200 mL/hr over 30 Minutes Intravenous  Once 04/17/23 1830 04/17/23 2108        Objective: Vitals:   04/18/23 0753 04/18/23 0931 04/18/23 0932 04/18/23 1100  BP: 96/62 (!) 79/59    Pulse:  (!) 131  (!) 127  Resp: (!) 28 (!) 31  (!) 22  Temp: 98.2 F (36.8 C)   (!) 100.4 F (38 C)  TempSrc: Oral   Axillary  SpO2:  97% 97%   Weight:      Height:        Intake/Output Summary (Last 24 hours)  at 04/18/2023 1242 Last data filed at 04/17/2023 2108 Gross per 24 hour  Intake 100.34 ml  Output --  Net 100.34 ml   Filed Weights   04/17/23 1619  Weight: 47.8 kg    Examination:  General exam: Appears calm and comfortable  Respiratory system: Coarse breath sounds without wheezing diffusely. + Transmitted upper airway noise.  +conversational dyspnea.  Cardiovascular system: S1 & S2 heard, tachycardic.  Extremities: Symmetric in appearance  Skin: No rashes, lesions or ulcers on exposed skin  Psychiatry: Judgement and insight appear normal. Mood & affect appropriate.   Data Reviewed: I have personally reviewed following labs and imaging studies  CBC: Recent Labs  Lab 04/11/23 1645 04/12/23 0336 04/17/23 1724 04/18/23 0306  WBC  2.9* 3.6* 1.3* 1.8*  NEUTROABS  --   --  0.4* 0.6*  HGB 7.0* 8.7* 8.1* 8.3*  HCT 22.4* 26.6* 25.3* 25.6*  MCV 97.8 92.7 93.7 93.1  PLT 106* 112* 40* 50*   Basic Metabolic Panel: Recent Labs  Lab 04/12/23 0336 04/17/23 1724 04/18/23 0306  NA 135 139 139  K 3.6 3.0* 3.2*  CL 94* 96* 94*  CO2 28 27 29   GLUCOSE 101* 84 88  BUN 9 9 13   CREATININE 1.59* 1.62* 2.58*  CALCIUM 9.3 8.1* 8.7*   GFR: Estimated Creatinine Clearance: 16.8 mL/min (A) (by C-G formula based on SCr of 2.58 mg/dL (H)).  Liver Function Tests: Recent Labs  Lab 04/12/23 0336 04/17/23 1724 04/18/23 0306  AST 46* 32 28  ALT 44 33 30  ALKPHOS 274* 257* 240*  BILITOT 1.0 0.5 1.0  PROT 6.3* 5.5* 5.8*  ALBUMIN 3.4* 3.0* 3.0*   Coagulation Profile: Recent Labs  Lab 04/18/23 0306  INR 1.0   Sepsis Labs: Recent Labs  Lab 04/17/23 2038 04/18/23 0306  LATICACIDVEN 2.2* 2.4*    Recent Results (from the past 240 hours)  MRSA Next Gen by PCR, Nasal     Status: None   Collection Time: 04/11/23  1:56 PM   Specimen: Nasal Mucosa; Nasal Swab  Result Value Ref Range Status   MRSA by PCR Next Gen NOT DETECTED NOT DETECTED Final    Comment: (NOTE) The GeneXpert MRSA Assay (FDA approved for NASAL specimens only), is one component of a comprehensive MRSA colonization surveillance program. It is not intended to diagnose MRSA infection nor to guide or monitor treatment for MRSA infections. Test performance is not FDA approved in patients less than 64 years old. Performed at [facility], [address], [place], [place] [address]   Resp panel by RT-PCR (RSV, Flu A&B, Covid) Anterior Nasal Swab     Status: None   Collection Time: 04/17/23  4:42 PM   Specimen: Anterior Nasal Swab  Result Value Ref Range Status   SARS Coronavirus 2 by RT PCR NEGATIVE NEGATIVE Final   Influenza A by PCR NEGATIVE NEGATIVE Final   Influenza B by PCR NEGATIVE NEGATIVE Final    Comment: (NOTE) The Xpert Xpress  SARS-CoV-2/FLU/RSV plus assay is intended as an aid in the diagnosis of influenza from Nasopharyngeal swab specimens and should not be used as a sole basis for treatment. Nasal washings and aspirates are unacceptable for Xpert Xpress SARS-CoV-2/FLU/RSV testing.  Fact Sheet for Patients: BloggerCourse.com  Fact Sheet for Healthcare Providers: SeriousBroker.it  This test is not yet approved or cleared by the [place] FDA and has been authorized for detection and/or diagnosis of SARS-CoV-2 by FDA under an Emergency Use Authorization (EUA). This EUA  will remain in effect (meaning this test can be used) for the duration of the COVID-19 declaration under Section 564(b)(1) of the Act, 21 U.S.C. section 360bbb-3(b)(1), unless the authorization is terminated or revoked.     Resp Syncytial Virus by PCR NEGATIVE NEGATIVE Final    Comment: (NOTE) Fact Sheet for Patients: BloggerCourse.com  Fact Sheet for Healthcare Providers: SeriousBroker.it  This test is not yet approved or cleared by the [place] FDA and has been authorized for detection and/or diagnosis of SARS-CoV-2 by FDA under an Emergency Use Authorization (EUA). This EUA will remain in effect (meaning this test can be used) for the duration of the COVID-19 declaration under Section 564(b)(1) of the Act, 21 U.S.C. section 360bbb-3(b)(1), unless the authorization is terminated or revoked.  Performed at [facility], [address], [place], [place] [address]   Culture, blood (routine x 2)     Status: None (Preliminary result)   Collection Time: 04/17/23  8:32 PM   Specimen: BLOOD RIGHT ARM  Result Value Ref Range Status   Specimen Description BLOOD RIGHT ARM  Final   Special Requests   Final    BOTTLES DRAWN AEROBIC AND ANAEROBIC Blood Culture adequate volume   Culture   Final    NO GROWTH < 12 HOURS Performed  at [facility], [address] [place], [place] [address]    Report Status PENDING  Incomplete  MRSA Next Gen by PCR, Nasal     Status: None   Collection Time: 04/18/23  2:18 AM   Specimen: Nasal Mucosa; Nasal Swab  Result Value Ref Range Status   MRSA by PCR Next Gen NOT DETECTED NOT DETECTED Final    Comment: (NOTE) The GeneXpert MRSA Assay (FDA approved for NASAL specimens only), is one component of a comprehensive MRSA colonization surveillance program. It is not intended to diagnose MRSA infection nor to guide or monitor treatment for MRSA infections. Test performance is not FDA approved in patients less than 29 years old. Performed at [facility], [address] [place], [place] [address]   Culture, blood (routine x 2)     Status: None (Preliminary result)   Collection Time: 04/18/23  3:03 AM   Specimen: BLOOD RIGHT HAND  Result Value Ref Range Status   Specimen Description BLOOD RIGHT HAND  Final   Special Requests   Final    BOTTLES DRAWN AEROBIC AND ANAEROBIC Blood Culture adequate volume   Culture   Final    NO GROWTH < 12 HOURS Performed at [facility], [address] [place], [place] [address]    Report Status PENDING  Incomplete      Radiology Studies: CT CHEST WO CONTRAST Result Date: 04/18/2023 CLINICAL DATA:  Shortness of breath EXAM: CT CHEST WITHOUT CONTRAST TECHNIQUE: Multidetector CT imaging of the chest was performed following the standard protocol without IV contrast. RADIATION DOSE REDUCTION: This exam was performed according to the departmental dose-optimization program which includes automated exposure control, adjustment of the mA and/or kV according to patient size and/or use of iterative reconstruction technique. COMPARISON:  Chest x-ray dated a. Chest CT 02/22/2023 FINDINGS: Cardiovascular: Heart is normal size. Aorta is normal caliber. Moderate coronary artery and aortic calcifications. SVC stent noted. Dialysis catheter enters  the heart from the IVC with the tip in the right atrium. Mediastinum/Nodes: Mediastinal adenopathy again noted. Right paratracheal lymph node with a short axis diameter of 2.8 cm, formally 2.9 cm. Subcarinal adenopathy with a short  axis diameter of 2.5 cm, previously 2 cm. Partially calcified pre-vascular adenopathy. No axillary adenopathy. Cannot assess the hila without IV contrast. Lungs/Pleura: Innumerable small pulmonary nodules throughout the right lung, increasing in size and number. Airspace disease in the right lung base in both the right middle lobe and right lower lobe. Complete opacification of the left hemithorax with a combination of moderate to large pleural effusion and atelectasis throughout the left lung. Upper Abdomen: Numerous low-density lesions again seen throughout the liver. Right hepatic dome lesion appears somewhat irregular and measures 1.6 cm compared to 9 mm previously. Musculoskeletal: No suspicious focal bony abnormality. IMPRESSION: Complete opacification of the left hemithorax with a combination of pleural effusion and left lung atelectasis/collapse. Extensive mediastinal adenopathy, similar to prior study. Innumerable small nodules throughout the right lung which appear to have increased increasing in size and number. Airspace disease in the right lung base in the right lower lobe and right middle lobe could reflect atelectasis, pneumonia or tumor. Numerous low-density lesions throughout the visualized liver, some of which have increased in size since prior study. Coronary artery disease. Aortic Atherosclerosis (ICD10-I70.0). Electronically Signed   By: Charlett Nose M.D.   On: 04/18/2023 01:05   DG Chest Port 1 View Result Date: 04/17/2023 CLINICAL DATA:  Chest pain EXAM: PORTABLE CHEST 1 VIEW COMPARISON:  04/12/2023 FINDINGS: Dialysis catheter enters the heart from below with the tip in the right atrium, unchanged. SVC stent in place. Complete opacification of the left  hemithorax, worsened since prior study. Extensive small nodules throughout the right lung are unchanged. IMPRESSION: Complete opacification of the left hemithorax, progressed since prior study. Diffuse pulmonary nodules throughout the right lung. Electronically Signed   By: Charlett Nose M.D.   On: 04/17/2023 17:27     Scheduled Meds:  allopurinol  100 mg Oral Daily   amiodarone  100 mg Oral Daily   Chlorhexidine Gluconate Cloth  6 each Topical Daily   famotidine  20 mg Oral BID   gabapentin  100 mg Oral TID   midodrine  15 mg Oral TID WC   pantoprazole  40 mg Oral Daily   potassium chloride  30 mEq Oral BID   sertraline  25 mg Oral Daily   sodium chloride flush  10-40 mL Intracatheter Q12H   Continuous Infusions:  sodium chloride     ceFEPime (MAXIPIME) IV     lactated ringers     norepinephrine (LEVOPHED) Adult infusion     [START ON 04/20/2023] vancomycin       LOS: 1 day    Time spent: 70 minutes   Sharlene Dory, DO [facility] 04/18/2023, 12:42 PM   Available via Epic secure chat 7am-7pm After these hours, please refer to coverage provider listed on [email or web address]

## 2023-04-18 NOTE — Progress Notes (Signed)
 Improved after chest tube. Eating dinner.  See previous note for plan. Would get ct with pigtail in place in am (non con) to see if pleural apposition

## 2023-04-19 ENCOUNTER — Inpatient Hospital Stay (HOSPITAL_COMMUNITY)

## 2023-04-19 DIAGNOSIS — C7800 Secondary malignant neoplasm of unspecified lung: Secondary | ICD-10-CM | POA: Diagnosis not present

## 2023-04-19 DIAGNOSIS — J910 Malignant pleural effusion: Secondary | ICD-10-CM | POA: Diagnosis not present

## 2023-04-19 DIAGNOSIS — J9621 Acute and chronic respiratory failure with hypoxia: Secondary | ICD-10-CM | POA: Diagnosis not present

## 2023-04-19 DIAGNOSIS — A419 Sepsis, unspecified organism: Secondary | ICD-10-CM | POA: Diagnosis not present

## 2023-04-19 DIAGNOSIS — R609 Edema, unspecified: Secondary | ICD-10-CM

## 2023-04-19 LAB — BASIC METABOLIC PANEL
Anion gap: 12 (ref 5–15)
BUN: 26 mg/dL — ABNORMAL HIGH (ref 6–20)
CO2: 27 mmol/L (ref 22–32)
Calcium: 9.8 mg/dL (ref 8.9–10.3)
Chloride: 96 mmol/L — ABNORMAL LOW (ref 98–111)
Creatinine, Ser: 4.17 mg/dL — ABNORMAL HIGH (ref 0.44–1.00)
GFR, Estimated: 12 mL/min — ABNORMAL LOW (ref >60)
Glucose, Bld: 121 mg/dL — ABNORMAL HIGH (ref 70–99)
Potassium: 4.8 mmol/L (ref 3.5–5.1)
Sodium: 135 mmol/L (ref 135–145)

## 2023-04-19 LAB — CBC
HCT: 22.4 % — ABNORMAL LOW (ref 36.0–46.0)
Hemoglobin: 7.1 g/dL — ABNORMAL LOW (ref 12.0–15.0)
MCH: 29.7 pg (ref 26.0–34.0)
MCHC: 31.7 g/dL (ref 30.0–36.0)
MCV: 93.7 fL (ref 80.0–100.0)
Platelets: 64 10*3/uL — ABNORMAL LOW (ref 150–400)
RBC: 2.39 MIL/uL — ABNORMAL LOW (ref 3.87–5.11)
RDW: 16.1 % — ABNORMAL HIGH (ref 11.5–15.5)
WBC: 11.7 10*3/uL — ABNORMAL HIGH (ref 4.0–10.5)
nRBC: 0 % (ref 0.0–0.2)

## 2023-04-19 LAB — TRIGLYCERIDES, BODY FLUIDS: Triglycerides, Fluid: 28 mg/dL

## 2023-04-19 LAB — HEPATITIS B SURFACE ANTIGEN: Hepatitis B Surface Ag: NONREACTIVE

## 2023-04-19 LAB — PHOSPHORUS: Phosphorus: 6.5 mg/dL — ABNORMAL HIGH (ref 2.5–4.6)

## 2023-04-19 MED ORDER — CHLORHEXIDINE GLUCONATE CLOTH 2 % EX PADS
6.0000 | MEDICATED_PAD | Freq: Every day | CUTANEOUS | Status: DC
  Administered 2023-04-21: 6 via TOPICAL

## 2023-04-19 MED ORDER — CALCITRIOL 0.5 MCG PO CAPS
3.2500 ug | ORAL_CAPSULE | ORAL | Status: DC
  Filled 2023-04-19 (×3): qty 1

## 2023-04-19 MED ORDER — HYDROCORTISONE SOD SUC (PF) 100 MG IJ SOLR
100.0000 mg | Freq: Two times a day (BID) | INTRAMUSCULAR | Status: DC
  Administered 2023-04-19 – 2023-04-21 (×4): 100 mg via INTRAVENOUS
  Filled 2023-04-19 (×4): qty 2

## 2023-04-19 MED ORDER — ALTEPLASE 2 MG IJ SOLR
INTRAMUSCULAR | Status: AC
  Administered 2023-04-19: 2 mg
  Filled 2023-04-19: qty 2

## 2023-04-19 MED ORDER — ENSURE ENLIVE PO LIQD: 237.0000 mL | Freq: Two times a day (BID) | ORAL | Status: DC

## 2023-04-19 MED ORDER — ALTEPLASE 2 MG IJ SOLR
2.0000 mg | Freq: Once | INTRAMUSCULAR | Status: AC
  Administered 2023-04-19: 2 mg

## 2023-04-19 MED ORDER — FAMOTIDINE 20 MG PO TABS: 20.0000 mg | ORAL_TABLET | Freq: Every day | ORAL | Status: DC

## 2023-04-19 NOTE — Progress Notes (Signed)
 NAME:  Tina Watkins, MRN:  [id number], DOB:  1965/10/26, LOS: 2 ADMISSION DATE:  04/17/2023, CONSULTATION DATE:  04/18/2023 REFERRING MD:  Alan Mulder, MD, CHIEF COMPLAINT:  SOB   History of Present Illness:  58 y.o. female with medical history significant of metastatic lung adenocarcinoma, suspected pancreatic adenocarcinoma, ESRD on hemodialysis, CHF who presented to the ed due shortness of breath.  Patient went to dialysis today and was hypotensive so was unable to complete her session.  She was transported to the emergency department where she found to be afebrile and hypotensive with systolics in the 80s.  Chest ray was obtained which showed a large left pleural effusion with near complete opacification of the left lung.  Labs showed respiratory viral panel negative, potassium 3.0, creatinine 1.6, troponin and 29, 30.  Lactic acid 2.2.  She had a Thoracentesis on Monday for 550 cc fluid.  She felt better and was d/c home. The patient was sen in ED room #6.  Her fiancee was at the bedside.  I explained findings of CT scan to both.  Pertinent  Medical History  Metastatic Adenocarcinoma Lung  Significant Hospital Events: Including procedures, antibiotic start and stop dates in addition to other pertinent events   3/16: Admitted for SOB  Interim History / Subjective:  Left-sided chest tube was placed with 750 cc output Patient is off BiPAP, currently on nasal cannula oxygen Remain afebrile  Objective   Blood pressure 97/73, pulse 96, temperature 97.6 F (36.4 C), temperature source Oral, resp. rate 16, height 4\' 11"  (1.499 m), weight 47.8 kg, SpO2 100%.    Vent Mode: BIPAP;PCV FiO2 (%):  [70 %-100 %] 70 % Set Rate:  [12 bmp] 12 bmp   Intake/Output Summary (Last 24 hours) at 04/19/2023 1033 Last data filed at 04/19/2023 1000 Gross per 24 hour  Intake 2224.51 ml  Output 735 ml  Net 1489.51 ml   Filed Weights   04/17/23 1619  Weight: 47.8 kg    Examination: Physical  exam: General: Acute on chronically ill-appearing female, lying on the bed HEENT: [place]/AT, eyes anicteric.  moist mucus membranes Neuro: Alert, awake following commands Chest: Rhonchorous sounds bilaterally, reduced air entry on left side at the bases Heart: Regular rate and rhythm, no murmurs or gallops Abdomen: Soft, nontender, nondistended, bowel sounds present Skin: No rash  Labs and images reviewed  Resolved Hospital Problem list   N/a  Assessment & Plan:  Acute on chronic respiratory failure with hypoxia Metastatic adenocarcinoma of lung involving pleural space Left-sided pleural effusion status post chest tube placement Septic shock due to postobstructive pneumonia Pancytopenia due to chemotherapy End-stage renal disease on hemodialysis Paroxysmal A-fib  Continue nasal cannula oxygen, titrate with O2 sat goal 92% Outpatient follow-up with hematology oncology Will repeat chest CT today to see if left lung is expanded, patient may require palliative Pleurx on the left Continue to titrate vasopressor support, currently on low-dose Levophed Continue stress dose steroid Continue broad-spectrum antibiotics with vancomycin and cefepime White count has improved Remain anemic and thrombocytopenic chronically in the setting of chemotherapy Nephrology is following for dialysis needs Remaining sinus rhythm Continue telemetry monitoring   Poor prognosis  Best Practice (right click and "Reselect all SmartList Selections" daily)   Per protocols CBC: Recent Labs  Lab 04/17/23 1724 04/18/23 0306 04/19/23 0538  WBC 1.3* 1.8* 11.7*  NEUTROABS 0.4* 0.6*  --   HGB 8.1* 8.3* 7.1*  HCT 25.3* 25.6* 22.4*  MCV 93.7 93.1 93.7  PLT 40* 50*  64*    Basic Metabolic Panel: Recent Labs  Lab 04/17/23 1724 04/18/23 0306 04/19/23 0538  NA 139 139 135  K 3.0* 3.2* 4.8  CL 96* 94* 96*  CO2 27 29 27   GLUCOSE 84 88 121*  BUN 9 13 26*  CREATININE 1.62* 2.58* 4.17*  CALCIUM 8.1* 8.7*  9.8   GFR: Estimated Creatinine Clearance: 10.4 mL/min (A) (by C-G formula based on SCr of 4.17 mg/dL (H)). Recent Labs  Lab 04/17/23 1724 04/17/23 2038 04/18/23 0306 04/19/23 0538  PROCALCITON  --   --  2.78  --   WBC 1.3*  --  1.8* 11.7*  LATICACIDVEN  --  2.2* 2.4*  --     Liver Function Tests: Recent Labs  Lab 04/17/23 1724 04/18/23 0306  AST 32 28  ALT 33 30  ALKPHOS 257* 240*  BILITOT 0.5 1.0  PROT 5.5* 5.8*  ALBUMIN 3.0* 3.0*   No results for input(s): "LIPASE", "AMYLASE" in the last 168 hours. No results for input(s): "AMMONIA" in the last 168 hours.  ABG    Component Value Date/Time   PHART 7.425 03/23/2019 1020   PCO2ART 32.9 03/23/2019 1020   PO2ART 131.0 (H) 03/23/2019 1020   HCO3 22.8 03/23/2019 1034   TCO2 24 11/16/2022 0636   ACIDBASEDEF 2.0 03/23/2019 1034   O2SAT 77.0 03/23/2019 1034     Coagulation Profile: Recent Labs  Lab 04/18/23 0306  INR 1.0    Cardiac Enzymes: No results for input(s): "CKTOTAL", "CKMB", "CKMBINDEX", "TROPONINI" in the last 168 hours.  HbA1C: Hemoglobin A1C  Date/Time Value Ref Range Status  07/14/2021 08:49 AM 4.0 4.0 - 5.6 % Final  05/08/2019 01:47 PM 4.8 4.0 - 5.6 % Final   HbA1c, POC (prediabetic range)  Date/Time Value Ref Range Status  07/14/2021 08:49 AM 4.0 (A) 5.7 - 6.4 % Final   HbA1c, POC (controlled diabetic range)  Date/Time Value Ref Range Status  07/14/2021 08:49 AM 4.0 0.0 - 7.0 % Final   HbA1c POC (<> result, manual entry)  Date/Time Value Ref Range Status  07/14/2021 08:49 AM 4.0 4.0 - 5.6 % Final   Hgb A1c MFr Bld  Date/Time Value Ref Range Status  12/10/2011 11:45 PM 5.5 <5.7 % Final    Comment:    (NOTE)                                                                       According to the ADA Clinical Practice Recommendations for 2011, when HbA1c is used as a screening test:  >=6.5%   Diagnostic of Diabetes Mellitus           (if abnormal result is confirmed) 5.7-6.4%    Increased risk of developing Diabetes Mellitus References:Diagnosis and Classification of Diabetes Mellitus,Diabetes Care,2011,34(Suppl 1):S62-S69 and Standards of Medical Care in         Diabetes - 2011,Diabetes Care,2011,34 (Suppl 1):S11-S61.    CBG: Recent Labs  Lab 04/18/23 1542  GLUCAP 92   The patient is critically ill due to septic shock/acute on chronic respiratory failure with hypoxia.  Critical care was necessary to treat or prevent imminent or life-threatening deterioration.  Critical care was time spent personally by me on the following activities: development of treatment plan  with patient and/or surrogate as well as nursing, discussions with consultants, evaluation of patient's response to treatment, examination of patient, obtaining history from patient or surrogate, ordering and performing treatments and interventions, ordering and review of laboratory studies, ordering and review of radiographic studies, pulse oximetry, re-evaluation of patient's condition and participation in multidisciplinary rounds.   During this encounter critical care time was devoted to patient care services described in this note for 44 minutes.     Cheri Fowler, MD [facility] See Amion for pager If no response to pager, please call [phone] until 7pm After 7pm, Please call E-link [phone]

## 2023-04-19 NOTE — Consult Note (Signed)
 Renal Service Consult Note [facility]  Tina Watkins 04/19/2023 Tina Krabbe, MD Requesting Physician: Dr. Merrily Pew, Tina Watkins.   Reason for Consult: ESRD pt w/ SOB HPI: The patient is a 58 y.o. year-old w/ PMH as below who presented to ED after dialysis where she had low blood pressures and was unable to complete her session.  At the ED she was found to be afebrile but hypotensive with systolic blood pressures in the 80s.  Chest x-ray showed near whiteout of the left lung with pleural effusion.,  Potassium 3, creatinine 1.6, lactic acid 2.2.  Patient was recently in the hospital earlier in March for thoracis centesis of her large left pleural effusion and she has also been seeing oncology for her advanced lung cancer.  While in the ED patient was talked to about CODE STATUS and was made DNR without intubation.  Pulmonary team was consulted and recommended chest tube and CT of the chest.  Patient was admitted and a chest tube was placed.  We are asked to see for dialysis.   Pt seen in the ICU, she is attempting to eat breakfast.  She is on nasal oxygen, she states her access is in her groin she has a dialysis catheter.  Denies any current pains   PMH Anxiety Chronic blind right eye Congestive heart failure Chronic back pain Depression ESRD on HD Fibromyalgia Gastroparesis Gout Glaucoma History of DVT History of stomach ulcers Iron deficiency anemia Atrial fibrillation History of pressure ulcers  History of seizures History of stroke   ROS - denies CP, no joint pain, no HA, no blurry vision, no rash, no diarrhea, no nausea/ vomiting, no dysuria, no difficulty voiding   Past Medical History  Past Medical History:  Diagnosis Date   Anxiety    Bacteremia due to Gram-negative bacteria 05/23/2011   Blind    right eye   Blind right eye    CHF (congestive heart failure) (HCC)    Chronic lower back pain    Complication of anesthesia    "woke up during OR; I have  an extremely high tolerance" (12/11/2011) 1 procedure was graft; the other procedures were procedures that are typically done with sedation.   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   Dysrhythmia    "tachycardia" (12/11/2011) new onset afib 10/15/14 EKG   E coli bacteremia 06/18/2011   Elevated LDL cholesterol level 12/2018   ESRD (end stage renal disease) (HCC) 06/12/2011   Tues-Thurs-Sat dialysis   Fibromyalgia    Gastroparesis    GERD (gastroesophageal reflux disease)    Glaucoma    right eye   Gout    H/O carpal tunnel syndrome    Headache 10/2019   no current problem per patient on 11/12/22   Headache(784.0)    "not often anymore" (12/11/2011)   Herpes genitalia 1994   History of blood transfusion    "more than a few times" (12/11/2011)   History of COVID-19, most recently 09/2022 05/09/2019   History of deep vein thrombosis (DVT) of right lower extremity 11/20/2017   History of stomach ulcers    Hypotension    Iron deficiency anemia    New onset a-fib (HCC)    10/15/14 EKG   Osteopenia    Peripheral neuropathy 11/2018   Pneumonia    x several but years ago   Pressure ulcer of sacral region, stage 1 07/2019   Seizures (HCC) 1994   "post  transplant; only have had that one" (12/11/2011)   Spinal stenosis in cervical region    Stroke [facility])     left basal ganglia lacunar infarct; Right frontal lobe lacunar infarct.   Stroke [facility]) ~ 1999; 2001   "briefly lost my vision; lost my right eye" (12/11/2011)   Vitamin D deficiency 12/2018   Past Surgical History  Past Surgical History:  Procedure Laterality Date   ANTERIOR CERVICAL DECOMP/DISCECTOMY FUSION N/A 01/08/2015   Procedure: Anterior Cervical Three-Four/Four-Five Decompression/Diskectomy/Fusion;  Surgeon: Hilda Lias, MD;  Location: [facility] NEURO ORS;  Service: Neurosurgery;  Laterality: N/A;  C3-4 C4-5 Anterior cervical decompression/diskectomy/fusion   APPENDECTOMY   ~ 2004   BIOPSY  09/12/2020   Procedure: BIOPSY;  Surgeon: Rachael Fee, MD;  Location: [facility] ENDOSCOPY;  Service: Endoscopy;;   BRONCHIAL BIOPSY  11/16/2022   Procedure: BRONCHIAL BIOPSIES;  Surgeon: Leslye Peer, MD;  Location: [facility] ENDOSCOPY;  Service: Pulmonary;;   BRONCHIAL BRUSHINGS  11/16/2022   Procedure: BRONCHIAL BRUSHINGS;  Surgeon: Leslye Peer, MD;  Location: [facility] ENDOSCOPY;  Service: Pulmonary;;   BRONCHIAL NEEDLE ASPIRATION BIOPSY  11/16/2022   Procedure: BRONCHIAL NEEDLE ASPIRATION BIOPSIES;  Surgeon: Leslye Peer, MD;  Location: [facility] ENDOSCOPY;  Service: Pulmonary;;   CATARACT EXTRACTION     right eye   COLONOSCOPY     COLONOSCOPY WITH PROPOFOL N/A 09/12/2020   Procedure: COLONOSCOPY WITH PROPOFOL;  Surgeon: Rachael Fee, MD;  Location: [facility] ENDOSCOPY;  Service: Endoscopy;  Laterality: N/A;   ENUCLEATION  2001   "right"   ESOPHAGOGASTRODUODENOSCOPY (EGD) WITH PROPOFOL N/A 04/21/2012   Procedure: ESOPHAGOGASTRODUODENOSCOPY (EGD) WITH PROPOFOL;  Surgeon: Rachael Fee, MD;  Location: [facility] ENDOSCOPY;  Service: Endoscopy;  Laterality: N/A;   ESOPHAGOGASTRODUODENOSCOPY (EGD) WITH PROPOFOL N/A 09/12/2020   Procedure: ESOPHAGOGASTRODUODENOSCOPY (EGD) WITH PROPOFOL;  Surgeon: Rachael Fee, MD;  Location: [facility] ENDOSCOPY;  Service: Endoscopy;  Laterality: N/A;   FIDUCIAL MARKER PLACEMENT  11/16/2022   Procedure: FIDUCIAL MARKER PLACEMENT;  Surgeon: Leslye Peer, MD;  Location[facility] ENDOSCOPY;  Service: Pulmonary;;   HEMOSTASIS CONTROL  11/16/2022   Procedure: HEMOSTASIS CONTROL;  Surgeon: Leslye Peer, MD;  Location: [facility] ENDOSCOPY;  Service: Pulmonary;;   INSERTION OF DIALYSIS CATHETER  1988   "AV graft LUA & LFA; LUA worked for 1 day; LFA never worked"   IR FLUORO GUIDE CV LINE RIGHT  07/11/2019   IR FLUORO GUIDE CV LINE RIGHT  10/20/2019   IR FLUORO GUIDE CV LINE RIGHT  05/31/2020   IR FLUORO GUIDE CV LINE RIGHT  03/29/2023   IR PTA VENOUS EXCEPT DIALYSIS CIRCUIT  05/31/2020    IR RADIOLOGY PERIPHERAL GUIDED IV START  07/11/2019   IR THORACENTESIS ASP PLEURAL SPACE W/IMG GUIDE  04/12/2023   IR US GUIDE VASC ACCESS RIGHT  07/11/2019   IR US GUIDE VASC ACCESS RIGHT  07/11/2019   IR US GUIDE VASC ACCESS RIGHT  07/11/2019   IR VENOCAVAGRAM IVC  05/31/2020   IR VENOCAVAGRAM IVC  03/29/2023   KIDNEY TRANSPLANT  1994; 1999; 2005   "right"   MULTIPLE TOOTH EXTRACTIONS     POLYPECTOMY  09/12/2020   Procedure: POLYPECTOMY;  Surgeon: Rachael Fee, MD;  Location: [facility] ENDOSCOPY;  Service: Endoscopy;;   RIGHT HEART CATH N/A 03/23/2019   Procedure: RIGHT HEART CATH;  Surgeon: Dolores Patty, MD;  [facility]: [facility] INVASIVE CV LAB;  Service: Cardiovascular;  Laterality: N/A;   TONSILLECTOMY     TOTAL NEPHRECTOMY  1988?; 1994; 2005  VIDEO BRONCHOSCOPY WITH RADIAL ENDOBRONCHIAL ULTRASOUND  11/16/2022   Procedure: VIDEO BRONCHOSCOPY WITH RADIAL ENDOBRONCHIAL ULTRASOUND;  Surgeon: Leslye Peer, MD;  Location: [facility] ENDOSCOPY;  Service: Pulmonary;;   Family History  Family History  Problem Relation Age of Onset   Glaucoma Mother    Pancreatic cancer Father    Multiple sclerosis Brother    Diabetes Maternal Uncle    Hypertension Maternal Grandmother    Breast cancer Neg Hx    Social History  reports that she has quit smoking. Her smoking use included cigarettes. She has a 52.5 pack-year smoking history. She has never been exposed to tobacco smoke. She has never used smokeless tobacco. She reports that she does not currently use alcohol. She reports current drug use. Frequency: 1.00 time per week. Drug: Marijuana. Allergies  Allergies  Allergen Reactions   Levofloxacin Itching and Rash   Tape Other (See Comments)    "Certain surgical tapes peel off my skin"   Home medications Prior to Admission medications   Medication Sig Start Date End Date Taking? Authorizing Provider  allopurinol (ZYLOPRIM) 100 MG tablet Take 0.5 tablets (50 mg total) by mouth 2 (two) times a week. Patient  taking differently: Take 100 mg by mouth daily in the afternoon. 12/24/22  Yes Tawkaliyar, Roya, DO  amiodarone (PACERONE) 200 MG tablet Take 0.5 tablets (100 mg total) by mouth daily. 07/03/22  Yes Eustace Pen, PA-C  apixaban (ELIQUIS) 2.5 MG TABS tablet Take 1 tablet (2.5 mg total) by mouth 2 (two) times daily. Okay to restart this medication on 11/18/2022 11/16/22  Yes Leslye Peer, MD  Colchicine 0.6 MG CAPS Take 0.6 mg by mouth daily as needed (gout flare up). 01/12/22  Yes Ivonne Andrew, NP  cyclobenzaprine (FLEXERIL) 5 MG tablet Take 1 tablet (5 mg total) by mouth at bedtime as needed. 08/21/22  Yes Rice, Jamesetta Orleans, MD  dexamethasone (DECADRON) 4 MG tablet Take 2 tablets (8mg ) by mouth daily starting the day after carboplatin for 3 days. Take with food 01/20/23  Yes Pasam, Avinash, MD  diphenhydrAMINE (BENADRYL) 25 mg capsule Take 1 capsule (25 mg total) by mouth every 8 (eight) hours as needed. 07/21/19  Yes Kallie Locks, FNP  famotidine (PEPCID) 20 MG tablet Take 1 tablet (20 mg total) by mouth 2 (two) times daily. 03/15/23  Yes Ivonne Andrew, NP  fluticasone-salmeterol (ADVAIR HFA) 782-95 MCG/ACT inhaler Inhale 2 puffs into the lungs 2 (two) times daily. 04/28/21  Yes Martina Sinner, MD  gabapentin (NEURONTIN) 100 MG capsule Take 100 mg by mouth 3 (three) times daily.   Yes [provider]  Lactulose 20 GM/30ML SOLN Take 30 mLs (20 g total) by mouth 2 (two) times daily as needed (constipation). 03/01/23  Yes Ghimire, Lyndel Safe, MD  midodrine (PROAMATINE) 10 MG tablet TAKE 1.5 TABLETS (15 MG TOTAL) BY MOUTH 3 (THREE) TIMES DAILY. NEEDS FOLLOW UP APPOINTMENT FOR ANYMORE REFILLS Patient taking differently: Take 20 mg by mouth 3 (three) times daily. NEEDS FOLLOW UP APPOINTMENT FOR ANYMORE REFILLS 01/18/23  Yes Bensimhon, Bevelyn Buckles, MD  omeprazole (PRILOSEC) 40 MG capsule TAKE 1 CAPSULE (40 MG TOTAL) BY MOUTH DAILY. 01/15/23  Yes Meryl Dare, MD   oxyCODONE-acetaminophen (PERCOCET) 10-325 MG tablet Take 1 tablet by mouth 4 (four) times daily as needed for pain. 09/08/19  Yes [provider]  prochlorperazine (COMPAZINE) 10 MG tablet Take 10 mg by mouth every 6 (six) hours as needed. 04/10/23  Yes [provider]  sertraline (ZOLOFT) 25 MG tablet Take 1 tablet (25 mg total) by mouth daily. 01/08/23  Yes Ivonne Andrew, NP  VELPHORO 500 MG chewable tablet Chew 500 mg by mouth See admin instructions. Take one tablet by mouth three times a daily with meals and snakes per patient   Yes [provider]  AURYXIA 1 GM 210 MG(Fe) tablet Take 420 mg by mouth 3 (three) times daily. Patient not taking: Reported on 04/11/2023    [provider]  calcium-vitamin D (OSCAL WITH D) 500-5 MG-MCG tablet Take 1 tablet by mouth 2 (two) times daily. Patient not taking: Reported on 04/11/2023 03/21/23   Arnetha Courser, MD  dorzolamide-timolol (COSOPT) 2-0.5 % ophthalmic solution Place 1 drop into the left eye every evening. 12/13/21   [provider]  metoCLOPramide (REGLAN) 10 MG tablet Take 1 tablet (10 mg total) by mouth 3 (three) times daily before meals. Patient taking differently: Take 10 mg by mouth daily in the afternoon. 12/02/22   Meryl Dare, MD  ondansetron (ZOFRAN) 8 MG tablet Take 1 tablet (8 mg total) by mouth every 8 (eight) hours as needed for nausea or vomiting. Start on the third day after carboplatin. Patient not taking: Reported on 04/17/2023 01/20/23   Pasam, Archie Patten, MD     Vitals:   04/19/23 1215 04/19/23 1230 04/19/23 1245 04/19/23 1300  BP: (!) 115/50 111/81 (!) 95/57 105/76  Pulse: (!) 108 (!) 112 (!) 111 (!) 108  Resp: (!) 23 (!) 24 18 (!) 21  Temp:      TempSrc:      SpO2: 97% 99% 95% 100%  Weight:      Height:       Exam Gen alert, no distress, 4 L Vandiver No rash, cyanosis or gangrene Sclera anicteric, throat clear  No jvd or bruits Chest dec'd BS L lung, R cta RRR no RG Abd soft  ntnd no mass or ascites +bs GU defer MS no joint effusions or deformity Ext no LE or UE edema, no other edema Neuro is alert, Ox 3 , nf    Femoral TDC in place      Renal-related home meds: Auryxia 2 tabs with meals Midodrine 15 mg 3 times daily Velphoro 500 mg with meals    OP HD: TTS GO  4h  B400  46.5kg   2K bath  TDC  Heparin 2000 - rocaltrol 3.25 mcg  - last HD 3/15, post HD wt 47.6kg (+1.1kg)   Assessment/ Plan: Acute on chronic hypoxic resp failure -  on IV abx for possible HCAP. SP chest tube for large pleural effusion drainage. Also has "popcorn" lung changes on the R due to lung metastases.  Septic shock - on low dose levo gtt ESRD - on HD TTS. Last HD was on Sat at outpt unit. Next HD tomorrow.  Volume - up 1-1.5 kg by wts, not grossly overloaded on exam. Small UF goal w/ HD tomorrow.  Anemia of esrd - Hb 7- 9 here, transfuse prn. Not on esa d/t #7.  Secondary hyperparathyroidism - CCa in range, add on phos.  Stage IV lung cancer - per Etheleen Mayhew  MD [facility] 04/19/2023, 1:31 PM  Recent Labs  Lab 04/17/23 1724 04/18/23 0306 04/19/23 0538  HGB 8.1* 8.3* 7.1*  ALBUMIN 3.0* 3.0*  --   CALCIUM 8.1* 8.7* 9.8  CREATININE 1.62* 2.58* 4.17*  K 3.0* 3.2* 4.8   Inpatient medications:  allopurinol  100 mg Oral Daily   amiodarone  100 mg Oral Daily   Chlorhexidine Gluconate Cloth  6 each Topical Daily   famotidine  20 mg Oral BID   gabapentin  100 mg Oral TID   hydrocortisone sod succinate (SOLU-CORTEF) inj  100 mg Intravenous Q12H   midodrine  15 mg Oral TID WC   pantoprazole  40 mg Oral Daily   sertraline  25 mg Oral Daily   sodium chloride flush  10 mL Intrapleural Q8H   sodium chloride flush  10-40 mL Intracatheter Q12H    ceFEPime (MAXIPIME) IV Stopped (04/18/23 2349)   norepinephrine (LEVOPHED) Adult infusion 1 mcg/min (04/19/23 1300)   [START ON 04/20/2023] vancomycin     albuterol, colchicine, Gerhardt's butt cream, HYDROmorphone  (DILAUDID) injection, ondansetron **OR** ondansetron (ZOFRAN) IV, oxyCODONE-acetaminophen **AND** oxyCODONE, polyethylene glycol, sodium chloride flush

## 2023-04-19 NOTE — TOC Initial Note (Signed)
 Transition of Care [facility]) - Initial/Assessment Note    Patient Details  Name: Tina Watkins MRN: [id number] Date of Birth: 02-Feb-1966  [facility] CM/SW Contact:    Gala Lewandowsky, RN Phone Number: 04/19/2023, 11:11 AM  Clinical Narrative: Risk for readmission assessment completed. Patient presented for shortness of breath. Hx ESRD-TTS. PTA patient states she was from home with fiance and he is at the bedside during the visit. Patient has DME rolling walker and oxygen via [employer] at 4 Liters. Patient is asking for a Production manager will continue to follow for DME orders as the patient progresses. Patient may benefit from PT/OT consult as she progresses. During rounds staff RN stated patient will have a palliative consult. Case Manager will continue to follow for additional needs.                   Expected Discharge Plan: Home w Home Health Services Barriers to Discharge: Continued Medical Work up   Patient Goals and CMS Choice Patient states their goals for this hospitalization and ongoing recovery are:: plan is to return home   Expected Discharge Plan and Services In-house Referral: NA Discharge Planning Services: CM Consult Post Acute Care Choice: Home Health Living arrangements for the past 2 months: Apartment   Prior Living Arrangements/Services Living arrangements for the past 2 months: Apartment Lives with:: Significant Other Patient language and need for interpreter reviewed:: Yes Do you feel safe going back to the place where you live?: Yes      Need for Family Participation in Patient Care: Yes (Comment) Care giver support system in place?: Yes (comment) Current home services: DME (Oxygen 4 liters via [employer]. Rolling walker.) Criminal Activity/Legal Involvement Pertinent to Current Situation/Hospitalization: No - Comment as needed  Activities of Daily Living   ADL Screening (condition at time of admission) Independently performs  ADLs?: Yes (appropriate for developmental age) Is the patient deaf or have difficulty hearing?: No Does the patient have difficulty seeing, even when wearing glasses/contacts?: Yes Does the patient have difficulty concentrating, remembering, or making decisions?: No  Permission Sought/Granted Permission sought to share information with : Family Supports, Case Manager   Emotional Assessment Appearance:: Appears older than stated age Attitude/Demeanor/Rapport: Engaged Affect (typically observed): Appropriate Orientation: : Oriented to Self, Oriented to Place Alcohol / Substance Use: Not Applicable Psych Involvement: No (comment)  Admission diagnosis:  Pleural effusion [J90] Malignant pleural effusion [J91.0] Chest pain, unspecified type [R07.9] Patient Active Problem List   Diagnosis Date Noted   Antineoplastic chemotherapy induced pancytopenia (HCC) 04/18/2023   Pleural effusion 04/17/2023   Coccyx pain 03/21/2023   Metastatic adenocarcinoma to lung (HCC) 03/20/2023   Chronic idiopathic thrombocytopenia (HCC) 03/20/2023   History of atrial flutter 03/20/2023   COPD (chronic obstructive pulmonary disease) (HCC) 03/20/2023   Chronic hypoxic respiratory failure (HCC) 03/20/2023   History of gout 03/20/2023   Depression 03/20/2023   Avascular necrosis of femoral neck (HCC) 03/20/2023   Encounter for antineoplastic chemotherapy 03/15/2023   Hypokalemia 02/28/2023   Abdominal pain 02/27/2023   Pancreatic adenocarcinoma (HCC) 02/27/2023   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) 02/22/2023   Encounter for central line care 02/15/2023   Nicotine dependence 01/13/2023   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   Lesion of pancreas 12/23/2022   Multiple lung nodules 12/23/2022   Hepatic cirrhosis (HCC) - radiographic finding 12/19/2022   Pulmonary hypertension (HCC)- radiographic finding; R heart cath unsuccessful 12/19/2022   Dyspnea 12/18/2022  Lung  cancer, upper lobe (HCC) 10/28/2022   Encounter for monitoring amiodarone therapy 07/01/2022   Atrial fibrillation (HCC) 07/01/2022   Bilateral hip pain 01/14/2022   Bilateral shoulder pain 06/06/2021   Bilateral hand swelling 06/06/2021   Cervical radiculitis 01/24/2021   Acute on chronic respiratory failure (HCC) 10/23/2020   Coagulation defect, unspecified (HCC) 08/01/2020   Aortic atherosclerosis (HCC) 07/10/2020   Hypercoagulable state due to atrial fibrillation (HCC) 04/23/2020   Chronic pain of both knees 03/13/2020   Symptomatic anemia    Pneumonia of left upper lobe due to infectious organism 08/05/2019   History of COVID-19, most recently 09/2022 05/09/2019   Acute on chronic anemia 05/09/2019   CHF (congestive heart failure) (HCC) 05/09/2019   DDD (degenerative disc disease), cervical 12/06/2018   Neuropathy 12/06/2018   Tobacco abuse 11/20/2017   History of CVA (cerebrovascular accident) 11/20/2017   GERD (gastroesophageal reflux disease) 11/20/2017   History of deep vein thrombosis (DVT) of right lower extremity 11/20/2017   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    Cervical stenosis of spinal canal 01/08/2015   Anemia of chronic disease 06/15/2014   History of kidney transplant    Chronic pain syndrome 04/09/2012   Gout 06/23/2011   Herpes infection 06/23/2011   Anxiety 06/23/2011   ESRD (end stage renal disease) (HCC) 06/12/2011   History of renal transplantation 05/22/2011   Septic shock (HCC) 05/22/2011   Gastroparesis 04/24/2008   PCP:  Ivonne Andrew, NP Pharmacy:   [facility] [phone] - [place], [place] - [address] AT [facility] [address] [place] [address] Phone: [phone] Fax: [phone]  [id number] [place], [place] - [address] AT [facility] [address] & [address] [facility] [address] Phone: [phone]  Fax: [phone]  Social Drivers of Health (SDOH) Social History: SDOH Screenings   Food Insecurity: No Food Insecurity (04/18/2023)  Housing: Low Risk  (04/18/2023)  Transportation Needs: No Transportation Needs (04/18/2023)  Utilities: Not At Risk (04/18/2023)  Alcohol Screen: Low Risk  (06/21/2022)  Depression (PHQ2-9): Low Risk  (01/06/2023)  Financial Resource Strain: Low Risk  (04/06/2023)  Physical Activity: Inactive (04/06/2023)  Social Connections: Moderately Isolated (04/11/2023)  Stress: Stress Concern Present (04/06/2023)  Tobacco Use: Medium Risk (04/17/2023)   SDOH Interventions:     Readmission Risk Interventions    04/19/2023   11:04 AM 04/12/2023    3:22 PM  Readmission Risk Prevention Plan  Transportation Screening Complete Complete  Medication Review (RN Care Manager) Referral to Pharmacy Referral to Pharmacy  PCP or Specialist appointment within 3-5 days of discharge  Complete  HRI or Home Care Consult Complete Complete  SW Recovery Care/Counseling Consult Complete Complete  Palliative Care Screening -- Not Applicable  Skilled Nursing Facility  Not Applicable

## 2023-04-19 NOTE — Progress Notes (Signed)
 Pt receives out-pt HD at [facility] on TTS. Will assist as needed.   Olivia Canter Renal Navigator [phone]

## 2023-04-20 ENCOUNTER — Other Ambulatory Visit: Payer: Self-pay | Admitting: Nurse Practitioner

## 2023-04-20 DIAGNOSIS — K219 Gastro-esophageal reflux disease without esophagitis: Secondary | ICD-10-CM

## 2023-04-20 DIAGNOSIS — Z992 Dependence on renal dialysis: Secondary | ICD-10-CM | POA: Diagnosis not present

## 2023-04-20 DIAGNOSIS — N186 End stage renal disease: Secondary | ICD-10-CM | POA: Diagnosis not present

## 2023-04-20 DIAGNOSIS — J9621 Acute and chronic respiratory failure with hypoxia: Secondary | ICD-10-CM | POA: Diagnosis not present

## 2023-04-20 DIAGNOSIS — J910 Malignant pleural effusion: Secondary | ICD-10-CM | POA: Diagnosis not present

## 2023-04-20 DIAGNOSIS — I480 Paroxysmal atrial fibrillation: Secondary | ICD-10-CM

## 2023-04-20 DIAGNOSIS — I959 Hypotension, unspecified: Secondary | ICD-10-CM

## 2023-04-20 LAB — RENAL FUNCTION PANEL
Albumin: 3.8 g/dL (ref 3.5–5.0)
Anion gap: 15 (ref 5–15)
BUN: 37 mg/dL — ABNORMAL HIGH (ref 6–20)
CO2: 25 mmol/L (ref 22–32)
Calcium: 11.2 mg/dL — ABNORMAL HIGH (ref 8.9–10.3)
Chloride: 99 mmol/L (ref 98–111)
Creatinine, Ser: 5.36 mg/dL — ABNORMAL HIGH (ref 0.44–1.00)
GFR, Estimated: 9 mL/min — ABNORMAL LOW (ref >60)
Glucose, Bld: 100 mg/dL — ABNORMAL HIGH (ref 70–99)
Phosphorus: 7 mg/dL — ABNORMAL HIGH (ref 2.5–4.6)
Potassium: 4.6 mmol/L (ref 3.5–5.1)
Sodium: 139 mmol/L (ref 135–145)

## 2023-04-20 LAB — CBC
HCT: 25.2 % — ABNORMAL LOW (ref 36.0–46.0)
Hemoglobin: 7.7 g/dL — ABNORMAL LOW (ref 12.0–15.0)
MCH: 29.6 pg (ref 26.0–34.0)
MCHC: 30.6 g/dL (ref 30.0–36.0)
MCV: 96.9 fL (ref 80.0–100.0)
Platelets: 71 10*3/uL — ABNORMAL LOW (ref 150–400)
RBC: 2.6 MIL/uL — ABNORMAL LOW (ref 3.87–5.11)
RDW: 16.5 % — ABNORMAL HIGH (ref 11.5–15.5)
WBC: 27.2 10*3/uL — ABNORMAL HIGH (ref 4.0–10.5)
nRBC: 0.1 % (ref 0.0–0.2)

## 2023-04-20 LAB — CYTOLOGY - NON PAP

## 2023-04-20 MED ORDER — ANTICOAGULANT SODIUM CITRATE 4% (200MG/5ML) IV SOLN: 5.0000 mL | Status: DC | PRN

## 2023-04-20 MED ORDER — HEPARIN SODIUM (PORCINE) 1000 UNIT/ML DIALYSIS
1000.0000 [IU] | INTRAMUSCULAR | Status: DC | PRN
  Administered 2023-04-20: 4600 [IU]
  Filled 2023-04-20: qty 1

## 2023-04-20 MED ORDER — HEPARIN SODIUM (PORCINE) 1000 UNIT/ML DIALYSIS
2000.0000 [IU] | Freq: Once | INTRAMUSCULAR | Status: AC
  Administered 2023-04-20: 2000 [IU] via INTRAVENOUS_CENTRAL
  Filled 2023-04-20: qty 2

## 2023-04-20 MED ORDER — NEPRO/CARBSTEADY PO LIQD: 237.0000 mL | ORAL | Status: DC | PRN

## 2023-04-20 MED ORDER — NEPRO/CARBSTEADY PO LIQD: 237.0000 mL | Freq: Three times a day (TID) | ORAL | Status: DC

## 2023-04-20 MED ORDER — HEPARIN SODIUM (PORCINE) 1000 UNIT/ML DIALYSIS
2000.0000 [IU] | INTRAMUSCULAR | Status: AC | PRN
  Administered 2023-04-20: 2000 [IU] via INTRAVENOUS_CENTRAL
  Filled 2023-04-20: qty 2

## 2023-04-20 MED ORDER — OLANZAPINE 10 MG IM SOLR
5.0000 mg | Freq: Once | INTRAMUSCULAR | Status: AC
  Administered 2023-04-20: 5 mg via INTRAVENOUS
  Filled 2023-04-20: qty 10

## 2023-04-20 MED ORDER — ALTEPLASE 2 MG IJ SOLR: 2.0000 mg | Freq: Once | INTRAMUSCULAR | Status: DC | PRN

## 2023-04-20 MED ORDER — HEPARIN SODIUM (PORCINE) 1000 UNIT/ML IJ SOLN: 2000.0000 [IU] | Freq: Once | INTRAMUSCULAR | Status: DC

## 2023-04-20 MED ORDER — LIDOCAINE-PRILOCAINE 2.5-2.5 % EX CREA: 1.0000 | TOPICAL_CREAM | CUTANEOUS | Status: DC | PRN

## 2023-04-20 MED ORDER — DEXMEDETOMIDINE HCL IN NACL 400 MCG/100ML IV SOLN
0.0000 ug/kg/h | INTRAVENOUS | Status: DC
  Administered 2023-04-20: 0.4 ug/kg/h via INTRAVENOUS
  Administered 2023-04-21: 1.2 ug/kg/h via INTRAVENOUS
  Administered 2023-04-21: 1.1 ug/kg/h via INTRAVENOUS
  Filled 2023-04-20 (×3): qty 100

## 2023-04-20 MED ORDER — PENTAFLUOROPROP-TETRAFLUOROETH EX AERO: 1.0000 | INHALATION_SPRAY | CUTANEOUS | Status: DC | PRN

## 2023-04-20 MED ORDER — LIDOCAINE HCL (PF) 1 % IJ SOLN: 5.0000 mL | INTRAMUSCULAR | Status: DC | PRN

## 2023-04-20 NOTE — Progress Notes (Signed)
 Pt's confusion, agitation, and WOB has been progressive today despite iHD treatment, BiPAP, and ongoing medical therapies.  Continues to have increased WOB on BiPAP, tachycardic, and appears to be suffering.  Dr. Merrily Pew and I spoke with fiance, Tina Needle in conference room with her mother by phone and updated on her continued decline with recommendations to transition to comfort care given her widespread metastatic disease, underlying comorbidities, and lack of response to medical therapies.  Her mother, Tina Watkins, is trying to fly in from [place], [place].  Would like to see her daughter prior to her going comfort.  Tina Needle does not want Tina Watkins to suffer.  We will try and support [name] as best we can till her mother arrives but high risk she will decline further prior to her mother arriving, possibly death.    - Remains DNR/ DNI.  Keep NPO given AMS.  Can use NE if need.  BiPAP vs HHFNC.  Prn dilaudid as already scheduled.         Posey Boyer, MSN, AG-ACNP-BC [facility] 04/20/2023, 4:21 PM  See Amion for pager If no response to pager , please call [phone] until 7pm After 7:00 pm call Elink  [phone] -

## 2023-04-20 NOTE — Progress Notes (Signed)
 UF turned off due to BP 85/59. Dr. Arlean Hopping notified.

## 2023-04-20 NOTE — Progress Notes (Signed)
 NAME:  Tina Watkins, MRN:  [id number], DOB:  [birth year], LOS: 3 ADMISSION DATE:  04/17/2023, CONSULTATION DATE:  04/18/2023 REFERRING MD:  Alan Mulder, MD, CHIEF COMPLAINT:  SOB   History of Present Illness:  58 y.o. female with medical history significant of metastatic lung adenocarcinoma, suspected pancreatic adenocarcinoma, ESRD on hemodialysis, CHF who presented to the ed due shortness of breath.  Patient went to dialysis today and was hypotensive so was unable to complete her session.  She was transported to the emergency department where she found to be afebrile and hypotensive with systolics in the 80s.  Chest ray was obtained which showed a large left pleural effusion with near complete opacification of the left lung.  Labs showed respiratory viral panel negative, potassium 3.0, creatinine 1.6, troponin and 29, 30.  Lactic acid 2.2.  She had a Thoracentesis 3/10 for 550 cc fluid.  She felt better and was d/c home..  Pertinent  Medical History  Metastatic Adenocarcinoma Lung  Significant Hospital Events: Including procedures, antibiotic start and stop dates in addition to other pertinent events   3/16: Admitted for SOB 3/16 left CT placed, output 3/17 off BiPAP, afebrile  Interim History / Subjective:  Plans for iHD today Off NE Has not needed NIV, Intermittently confused,  more flustered this morning trying to use her phone afebrile  Objective   Blood pressure (!) 88/66, pulse 94, temperature 98.4 F (36.9 C), temperature source Oral, resp. rate 19, height 4\' 11"  (1.499 m), weight 47.8 kg, SpO2 99%.        Intake/Output Summary (Last 24 hours) at 04/20/2023 0929 Last data filed at 04/20/2023 0200 Gross per 24 hour  Intake 487.01 ml  Output 240 ml  Net 247.01 ml   Filed Weights   04/17/23 1619  Weight: 47.8 kg   Examination: General:  AoC ill appearing older than stated age female sitting up in bed, currently flustered trying to make a phone call but can not  figure out her phone HEENT: MM pink/moist Neuro: Awake, oriented to name, place, month, eventual year but seems confused otherwise.  MAE CV: rr, ST, R femoral HD cath, LUE PICC PULM:  tachypneic w/ abd WOB audible congestion without sputum production, bilateral diffuse rhonchi/ wheeze, diminished left base, pigtail left lateral with yellow fluid, no airleak/ tidaling GI: soft, +bs Extremities: warm/dry, generalized edema  Skin: no rashes   Labs> CBC pending draw w/ iHD, BMET review  CT output 240 ml/ 24hrs  Cultures reviewed>ngtd  CT chest wo contrast 3/17>  1. Interval placement of a left-sided chest tube with slight decrease in the left pleural effusion and slight aeration of the left lower lobe. 2. Complete consolidation of the left upper lobe. 3. Small left pneumothorax, likely iatrogenic. 4. Innumerable pulmonary nodules in keeping with metastatic disease. 5. Multiple liver metastatic disease. 6. Hypodense lesions of the tail of the pancreas.  Resolved Hospital Problem list   N/a  Assessment & Plan:  Acute on chronic respiratory failure with hypoxia Metastatic lung adenocarcinoma, stage IV, mets> pleural space with pancreas and radiologic evidence to brain/ liver Left-sided pleural effusion status post chest tube placement Septic shock due to postobstructive pneumonia Pancytopenia due to chemotherapy/ sepsis, improving End-stage renal disease on hemodialysis Paroxysmal A-fib Chronic hypotension on midodrine GERD Depression Gout Deconditioning - cont O2 for sat goal > 92% - BiPAP prn - remains DNR/ DNI - cont cefepime, can d/c vanc, MRSA PCR neg - amio PO, holding home eliquis.  Remains ST -  off NE overnight, cont midodrine - continue stress dose steroids wean - pending CBC - will discuss CT with Dr. Merrily Pew cont CT as output still > 200.  Considering palliative pleurx vs recs to transition to comfort care - nephrology following, plans for iHD today, minimal UF -  f/u up with hematology/oncology  - maximize nutrition as able - delirium precautions, hopefully iHD will help with confusion - pending PMT consult  > overall prognosis is poor.  If any progressive decline> would recommend no vasopressor support and transition to comfort care    Best Practice (right click and "Reselect all SmartList Selections" daily)   Per protocols CBC: Recent Labs  Lab 04/17/23 1724 04/18/23 0306 04/19/23 0538  WBC 1.3* 1.8* 11.7*  NEUTROABS 0.4* 0.6*  --   HGB 8.1* 8.3* 7.1*  HCT 25.3* 25.6* 22.4*  MCV 93.7 93.1 93.7  PLT 40* 50* 64*    Basic Metabolic Panel: Recent Labs  Lab 04/17/23 1724 04/18/23 0306 04/19/23 0538 04/19/23 2033 04/20/23 0345  NA 139 139 135  --  139  K 3.0* 3.2* 4.8  --  4.6  CL 96* 94* 96*  --  99  CO2 27 29 27   --  25  GLUCOSE 84 88 121*  --  100*  BUN 9 13 26*  --  37*  CREATININE 1.62* 2.58* 4.17*  --  5.36*  CALCIUM 8.1* 8.7* 9.8  --  11.2*  PHOS  --   --   --  6.5* 7.0*   GFR: Estimated Creatinine Clearance: 8.1 mL/min (A) (by C-G formula based on SCr of 5.36 mg/dL (H)). Recent Labs  Lab 04/17/23 1724 04/17/23 2038 04/18/23 0306 04/19/23 0538  PROCALCITON  --   --  2.78  --   WBC 1.3*  --  1.8* 11.7*  LATICACIDVEN  --  2.2* 2.4*  --     Liver Function Tests: Recent Labs  Lab 04/17/23 1724 04/18/23 0306 04/20/23 0345  AST 32 28  --   ALT 33 30  --   ALKPHOS 257* 240*  --   BILITOT 0.5 1.0  --   PROT 5.5* 5.8*  --   ALBUMIN 3.0* 3.0* 3.8   No results for input(s): "LIPASE", "AMYLASE" in the last 168 hours. No results for input(s): "AMMONIA" in the last 168 hours.  ABG    Component Value Date/Time   PHART 7.425 03/23/2019 1020   PCO2ART 32.9 03/23/2019 1020   PO2ART 131.0 (H) 03/23/2019 1020   HCO3 22.8 03/23/2019 1034   TCO2 24 11/16/2022 0636   ACIDBASEDEF 2.0 03/23/2019 1034   O2SAT 77.0 03/23/2019 1034     Coagulation Profile: Recent Labs  Lab 04/18/23 0306  INR 1.0    Cardiac  Enzymes: No results for input(s): "CKTOTAL", "CKMB", "CKMBINDEX", "TROPONINI" in the last 168 hours.  HbA1C: Hemoglobin A1C  Date/Time Value Ref Range Status  07/14/2021 08:49 AM 4.0 4.0 - 5.6 % Final  05/08/2019 01:47 PM 4.8 4.0 - 5.6 % Final   HbA1c, POC (prediabetic range)  Date/Time Value Ref Range Status  07/14/2021 08:49 AM 4.0 (A) 5.7 - 6.4 % Final   HbA1c, POC (controlled diabetic range)  Date/Time Value Ref Range Status  07/14/2021 08:49 AM 4.0 0.0 - 7.0 % Final   HbA1c POC (<> result, manual entry)  Date/Time Value Ref Range Status  07/14/2021 08:49 AM 4.0 4.0 - 5.6 % Final   Hgb A1c MFr Bld  Date/Time Value Ref Range Status  12/10/2011 11:45 PM  5.5 <5.7 % Final    Comment:    (NOTE)                                                                       According to the ADA Clinical Practice Recommendations for 2011, when HbA1c is used as a screening test:  >=6.5%   Diagnostic of Diabetes Mellitus           (if abnormal result is confirmed) 5.7-6.4%   Increased risk of developing Diabetes Mellitus References:Diagnosis and Classification of Diabetes Mellitus,Diabetes Care,2011,34(Suppl 1):S62-S69 and Standards of Medical Care in         Diabetes - 2011,Diabetes Care,2011,34 (Suppl 1):S11-S61.    CBG: Recent Labs  Lab 04/18/23 1542  GLUCAP 92   CCT 36 mins     Posey Boyer, MSN, AG-ACNP-BC [facility] 04/20/2023, 9:29 AM  See Amion for pager If no response to pager , please call [phone] until 7pm After 7:00 pm call Elink  [phone]

## 2023-04-20 NOTE — Plan of Care (Signed)
   Problem: Activity: Goal: Risk for activity intolerance will decrease Outcome: Progressing

## 2023-04-20 NOTE — Progress Notes (Addendum)
 eLink Physician-Brief Progress Note Patient Name: JALEXIA LALLI DOB: 1966/01/15 MRN: [id number]   Date of Service  04/20/2023  HPI/Events of Note  58 year old female with metastatic adenocarcinoma of lung with mets to liver, suspected pancreatic adenocarcinoma, incisional disease on hemodialysis who presented with acute on chronic respiratory failure with hypoxia   Her overall prognosis is extremely poor.  Currently, the patient is delirious, removing mittens and pulling off BiPAP mask.  Per notes, if there is progressive decline, would likely benefit from limited vasopressor support and discussion of transition to comfort care.  Actively combating family on camera examination and yelling despite BiPAP on.   eICU Interventions  One-time olanzapine.  Will initiate Precedex for the time being at limited doses.   2209 -persistent agitation, paranoia, further BiPAP mask.  Given the degree of agitation, can titrate Precedex up as needed.  Can also trial heated high flow nasal cannula as an alternative.  Intervention Category Minor Interventions: Agitation / anxiety - evaluation and management  Aidan Caloca 04/20/2023, 8:10 PM

## 2023-04-20 NOTE — Procedures (Signed)
 Received patient in bed to unit.  Alert and oriented.  Informed consent signed and in chart.   TX duration: 3.5 hours  Patient tolerated well.  Alert, without acute distress.  Hand-off given to patient's nurse.   Access used: right leg cath Access issues: none  Total UF removed: -100 ml   Lu Duffel, RN [facility]

## 2023-04-20 NOTE — Telephone Encounter (Signed)
 Please advise [facility]

## 2023-04-20 NOTE — Progress Notes (Signed)
 While rounding in family waiting area Chaplain met pt's fiance and other family members.  Chaplain engaged in ministry with fiance as he explained his beloved is likely to die later tonight or tomorrow morning. The hope is pt's mother will arrive in time from [place] to see her daughter before she dies. Fiance reported he was doing ok and didn't need anything from [place].  Chaplain on standby.  Vernell Morgans Chaplain

## 2023-04-20 NOTE — Procedures (Signed)
 Dr. Arlean Hopping @ bedside. Orders received for okay to leave UF off.

## 2023-04-20 NOTE — Progress Notes (Signed)
 Initial Nutrition Assessment  DOCUMENTATION CODES:  Non-severe (moderate) malnutrition in context of chronic illness (metastatic cancer, ESRD, respiratory failure)  INTERVENTION:  Recommend considering a bowel regimen if constipation persists  Nepro Shake po TID, each supplement provides 425 kcal and 19 grams protein  Eating meals as tolerated when pt is not actively on BiPAP  Discontinue Ensure Enlive per pt preference  NUTRITION DIAGNOSIS:  Moderate Malnutrition related to chronic illness (metastatic cancer, ESRD, respiratory failure) as evidenced by severe muscle depletion, moderate fat depletion.  GOAL:  Patient will meet greater than or equal to 90% of their needs  MONITOR:  PO intake, Supplement acceptance  REASON FOR ASSESSMENT:  Consult Assessment of nutrition requirement/status  ASSESSMENT:  Pt with hx of ESRD (HD T/Th/S), COPD, CHF, anemia of renal disease, gastroparesis, pancreatic and lung cancer (diagnosed 01/2023 w/ mets; plan for palliative chemoimmunotherapy), chronic pain, and chronic respiratory failure on 4L O2 at home. 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 Admitted for SOB and chest tightness, xray confirmed large pleural effusions; diagnosed with acute on chronic respiratory failure and sepsis.  Previous admission: 3/10 thoracentesis removed Current Admission: 3/16 admitted, chest tube placed, BiPAP; out of chest tube 3/17 off BiPAP, nasal cannula O2; 1.5L out of chest tube 3/18 placed on BiPAP again, HD treatment  Pt being monitored by critical care for pleural effusion and sepsis, currently on IV. Will continue HD treatments today 3/18 to stay on T/Th/S schedule while admitted. Transfusions for anemia as needed since pt cannot be on ESA with lung cancer. Pt had recent admission 3/10 in which thoracentesis conducted to remove .  Pt put back on BiPAP 3/18. Spoke with pt who was on BiPAP and  receiving HD treatment at time of assessment. Pt unable to answer most questions but did nod her head yes or no to certain questions. Pt agrees she is not eating well due to low appetite. Per RN, pt has only been eating 10-15% of meals and tends to be confused in between dialysis treatments. RN discussed palliative consult to discuss GOC and EOL. Discussed with pt that the goal is for her to eat anything she can tolerate. Pt nodded head to preferring Nepro over Ensure, adjusted order per preference. Pt still has not had bowel movement since admission, currently not on a bowel regimen.   Unable to obtain diet and wt hx since pt was having trouble speaking during assessment and fiance was not present in the room at the time. Per chart review, pt admitted 1.3kg over reported dry weight and has since lost over 2L of output via chest tube with a small UF goal for HD treatment 2/18, but UF was turned off due to drop in BP. Nutrition focused physical exam conducted during assessment showed continued edema in BLE, hands, and stomach with moderate to severe muscle/fat depletions.  Output: Chest tube: 1.5L (last 24 hours)  Medications reviewed and include: zoloft, protonix, pepcid, calcitriol  Labs reviewed: CBG 92mg /dL, Hgb 7.1, BUN 37, Creatinine 5.36, Potassium 4.6, Phosphorus 7  NUTRITION - FOCUSED PHYSICAL EXAM:  Flowsheet Row Most Recent Value  Orbital Region Moderate depletion  Upper Arm Region Severe depletion  Thoracic and Lumbar Region Unable to assess  Buccal Region Unable to assess  Temple Region Severe depletion  Clavicle Bone Region Severe depletion  Clavicle and Acromion Bone Region Severe depletion  Scapular Bone Region Moderate depletion  Dorsal Hand Unable to assess  Patellar Region Severe depletion  Anterior Thigh Region Severe depletion  Posterior Calf Region Unable to assess  Edema (RD Assessment) Moderate  [BLE, bilateral hands, stomach]  Hair Reviewed  [hair falling out,  currently on chemo]  Eyes Unable to assess  Mouth Unable to assess  Skin Reviewed  Nails Reviewed  [poor capillary refill]   Diet Order:   Diet Order             Diet regular Room service appropriate? Yes; Fluid consistency: Thin  Diet effective now                  EDUCATION NEEDS:   Not appropriate for education at this time  Skin:  Skin Assessment: Skin Integrity Issues: Skin Integrity Issues:: Stage II Stage II: sacrum  Last BM:  PTA  Height:  Ht Readings from Last 1 Encounters:  04/17/23 4\' 11"  (1.499 m)   Weight:  Wt Readings from Last 1 Encounters:  04/17/23 47.8 kg   Ideal Body Weight:  44.7 kg  BMI:  Body mass index is 21.28 kg/m.  Estimated Nutritional Needs:   Kcal:  1500-1700  Protein:  75-90g  Fluid:  1 L + Output  Louis Meckel Dietetic Intern

## 2023-04-20 NOTE — Progress Notes (Signed)
 [facility] Progress Note  Subjective:  - on HD this morning, on bipap, bp's dropping  Vitals:   04/20/23 1245 04/20/23 1300 04/20/23 1315 04/20/23 1330  BP: 90/77 132/86 112/88 109/77  Pulse: (!) 122 (!) 129 (!) 131 (!) 135  Resp: (!) 36 (!) 34 (!) 33 (!) 24  Temp:      TempSrc:      SpO2: 99% 100% 100% 95%  Weight:      Height:        Exam: Gen alert, no distress, on bipap Sclera anicteric, throat clear  No jvd or bruits Chest dec'd BS L lung, R cta RRR no RG Abd soft, ntnd, no ascites Ext no LE edema Neuro is alert, Ox 3 , nf    Femoral TDC in place    Renal-related home meds: Auryxia 2 tabs with meals Midodrine 15 mg 3 times daily Velphoro 500 mg with meals      OP HD: TTS GO  4h  B400  46.5kg   2K bath  TDC  Heparin 2000 - rocaltrol 3.25 mcg  - last HD 3/15, post HD wt 47.6kg (+1.1kg)     Assessment/ Plan: Acute on chronic hypoxic resp failure -  on IV abx for possible HCAP. SP chest tube for large pleural effusion drainage. Also has "popcorn" lung changes on the R due to lung metastases. Trying to get fluid off w/ HD but will not get much today due to hypotension.  Septic shock - on low dose levo gtt ESRD - on HD TTS. Last HD Sat. HD today in progress.  Volume - up 1.5kg by wts, no vol excess on exam. UF 2 L if BP's tolerate Anemia of esrd - Hb 7- 9 here, transfuse prn. Not on esa d/t #7.  Secondary hyperparathyroidism - CCa in range, phos up a bit.  Stage IV lung cancer - per Etheleen Mayhew MD  [facility] 04/20/2023, 1:43 PM  Recent Labs  Lab 04/18/23 0306 04/19/23 0538 04/19/23 2033 04/20/23 0345 04/20/23 1030  HGB 8.3* 7.1*  --   --  7.7*  ALBUMIN 3.0*  --   --  3.8  --   CALCIUM 8.7* 9.8  --  11.2*  --   PHOS  --   --  6.5* 7.0*  --   CREATININE 2.58* 4.17*  --  5.36*  --   K 3.2* 4.8  --  4.6  --    No results for input(s): "IRON", "TIBC", "FERRITIN" in the last 168 hours. Inpatient medications:  allopurinol  100 mg  Oral Daily   amiodarone  100 mg Oral Daily   calcitRIOL  3.25 mcg Oral Q T,Th,Sat-1800   Chlorhexidine Gluconate Cloth  6 each Topical Daily   Chlorhexidine Gluconate Cloth  6 each Topical Q0600   famotidine  20 mg Oral Daily   feeding supplement  237 mL Oral BID BM   gabapentin  100 mg Oral TID   heparin sodium (porcine)  2,000 Units Intracatheter Once   hydrocortisone sod succinate (SOLU-CORTEF) inj  100 mg Intravenous Q12H   midodrine  15 mg Oral TID WC   pantoprazole  40 mg Oral Daily   sertraline  25 mg Oral Daily   sodium chloride flush  10 mL Intrapleural Q8H   sodium chloride flush  10-40 mL Intracatheter Q12H    anticoagulant sodium citrate     ceFEPime (MAXIPIME) IV Stopped (04/19/23 2231)   norepinephrine (LEVOPHED) Adult infusion  Stopped (04/19/23 1953)   vancomycin     albuterol, alteplase, anticoagulant sodium citrate, feeding supplement (NEPRO CARB STEADY), Gerhardt's butt cream, heparin, HYDROmorphone (DILAUDID) injection, lidocaine (PF), lidocaine-prilocaine, ondansetron **OR** ondansetron (ZOFRAN) IV, oxyCODONE-acetaminophen **AND** oxyCODONE, pentafluoroprop-tetrafluoroeth, polyethylene glycol, sodium chloride flush

## 2023-04-21 DIAGNOSIS — J910 Malignant pleural effusion: Principal | ICD-10-CM

## 2023-04-21 DIAGNOSIS — L8990 Pressure ulcer of unspecified site, unspecified stage: Secondary | ICD-10-CM | POA: Insufficient documentation

## 2023-04-21 DIAGNOSIS — E440 Moderate protein-calorie malnutrition: Secondary | ICD-10-CM | POA: Insufficient documentation

## 2023-04-21 DIAGNOSIS — Z992 Dependence on renal dialysis: Secondary | ICD-10-CM | POA: Diagnosis not present

## 2023-04-21 DIAGNOSIS — J9621 Acute and chronic respiratory failure with hypoxia: Secondary | ICD-10-CM | POA: Diagnosis not present

## 2023-04-21 DIAGNOSIS — N186 End stage renal disease: Secondary | ICD-10-CM | POA: Diagnosis not present

## 2023-04-21 DIAGNOSIS — Z515 Encounter for palliative care: Secondary | ICD-10-CM

## 2023-04-21 LAB — RENAL FUNCTION PANEL
Albumin: 3.3 g/dL — ABNORMAL LOW (ref 3.5–5.0)
Anion gap: 15 (ref 5–15)
BUN: 22 mg/dL — ABNORMAL HIGH (ref 6–20)
CO2: 26 mmol/L (ref 22–32)
Calcium: 9.7 mg/dL (ref 8.9–10.3)
Chloride: 96 mmol/L — ABNORMAL LOW (ref 98–111)
Creatinine, Ser: 3.2 mg/dL — ABNORMAL HIGH (ref 0.44–1.00)
GFR, Estimated: 16 mL/min — ABNORMAL LOW (ref >60)
Glucose, Bld: 80 mg/dL (ref 70–99)
Phosphorus: 4.4 mg/dL (ref 2.5–4.6)
Potassium: 3.6 mmol/L (ref 3.5–5.1)
Sodium: 137 mmol/L (ref 135–145)

## 2023-04-21 LAB — MAGNESIUM: Magnesium: 2.1 mg/dL (ref 1.7–2.4)

## 2023-04-21 LAB — HEPATITIS B SURFACE ANTIBODY, QUANTITATIVE: Hep B S AB Quant (Post): 18.3 m[IU]/mL

## 2023-04-21 MED ORDER — MIDAZOLAM HCL 2 MG/2ML IJ SOLN
2.0000 mg | INTRAMUSCULAR | Status: DC | PRN
  Administered 2023-04-21: 2 mg via INTRAVENOUS
  Filled 2023-04-21: qty 2

## 2023-04-21 MED ORDER — GLYCOPYRROLATE 1 MG PO TABS: 1.0000 mg | ORAL_TABLET | ORAL | Status: DC | PRN

## 2023-04-21 MED ORDER — GLYCOPYRROLATE 0.2 MG/ML IJ SOLN: 0.2000 mg | INTRAMUSCULAR | Status: DC | PRN

## 2023-04-21 MED ORDER — ACETAMINOPHEN 650 MG RE SUPP: 650.0000 mg | Freq: Four times a day (QID) | RECTAL | Status: DC | PRN

## 2023-04-21 MED ORDER — MORPHINE BOLUS VIA INFUSION
5.0000 mg | INTRAVENOUS | Status: DC | PRN
  Administered 2023-04-21: 5 mg via INTRAVENOUS

## 2023-04-21 MED ORDER — ACETAMINOPHEN 325 MG PO TABS: 650.0000 mg | ORAL_TABLET | Freq: Four times a day (QID) | ORAL | Status: DC | PRN

## 2023-04-21 MED ORDER — POLYVINYL ALCOHOL 1.4 % OP SOLN: 1.0000 [drp] | Freq: Four times a day (QID) | OPHTHALMIC | Status: DC | PRN

## 2023-04-21 MED ORDER — SODIUM CHLORIDE 0.9 % IV SOLN: INTRAVENOUS | Status: DC

## 2023-04-21 MED ORDER — GLYCOPYRROLATE 0.2 MG/ML IJ SOLN
0.2000 mg | INTRAMUSCULAR | Status: DC | PRN
  Administered 2023-04-21: 0.2 mg via INTRAVENOUS
  Filled 2023-04-21: qty 1

## 2023-04-21 MED ORDER — MORPHINE 100MG IN NS 100ML (1MG/ML) PREMIX INFUSION
0.0000 mg/h | INTRAVENOUS | Status: DC
  Administered 2023-04-21: 5 mg/h via INTRAVENOUS
  Filled 2023-04-21: qty 100

## 2023-04-22 LAB — BODY FLUID CULTURE W GRAM STAIN

## 2023-04-22 LAB — CULTURE, BLOOD (ROUTINE X 2)
Culture: NO GROWTH
Special Requests: ADEQUATE

## 2023-04-23 ENCOUNTER — Other Ambulatory Visit (HOSPITAL_COMMUNITY)

## 2023-04-23 LAB — CULTURE, BLOOD (ROUTINE X 2)
Culture: NO GROWTH
Special Requests: ADEQUATE

## 2023-04-26 NOTE — Addendum Note (Signed)
 Encounter addended by: Arby Barrette on: 04/26/2023 8:57 AM  Actions taken: Imaging Exam ended

## 2023-04-26 NOTE — Addendum Note (Signed)
 Encounter addended by: Arby Barrette on: 04/26/2023 9:18 AM  Actions taken: Imaging Exam ended

## 2023-04-28 ENCOUNTER — Ambulatory Visit: Payer: Medicare Other | Admitting: Internal Medicine

## 2023-04-28 DIAGNOSIS — M1A3720 Chronic gout due to renal impairment, left ankle and foot, without tophus (tophi): Secondary | ICD-10-CM

## 2023-04-28 DIAGNOSIS — Z5181 Encounter for therapeutic drug level monitoring: Secondary | ICD-10-CM

## 2023-04-28 DIAGNOSIS — M25551 Pain in right hip: Secondary | ICD-10-CM

## 2023-04-28 DIAGNOSIS — G8929 Other chronic pain: Secondary | ICD-10-CM

## 2023-04-28 IMAGING — CR DG CHEST 2V
2 series · 2 of 2 positions shown · non-contrast
Comparison: 07/18/2020.

CLINICAL DATA: Left-sided chest pain. Some shortness of breath and
weakness.

EXAM:
CHEST - 2 VIEW

[chest lat]
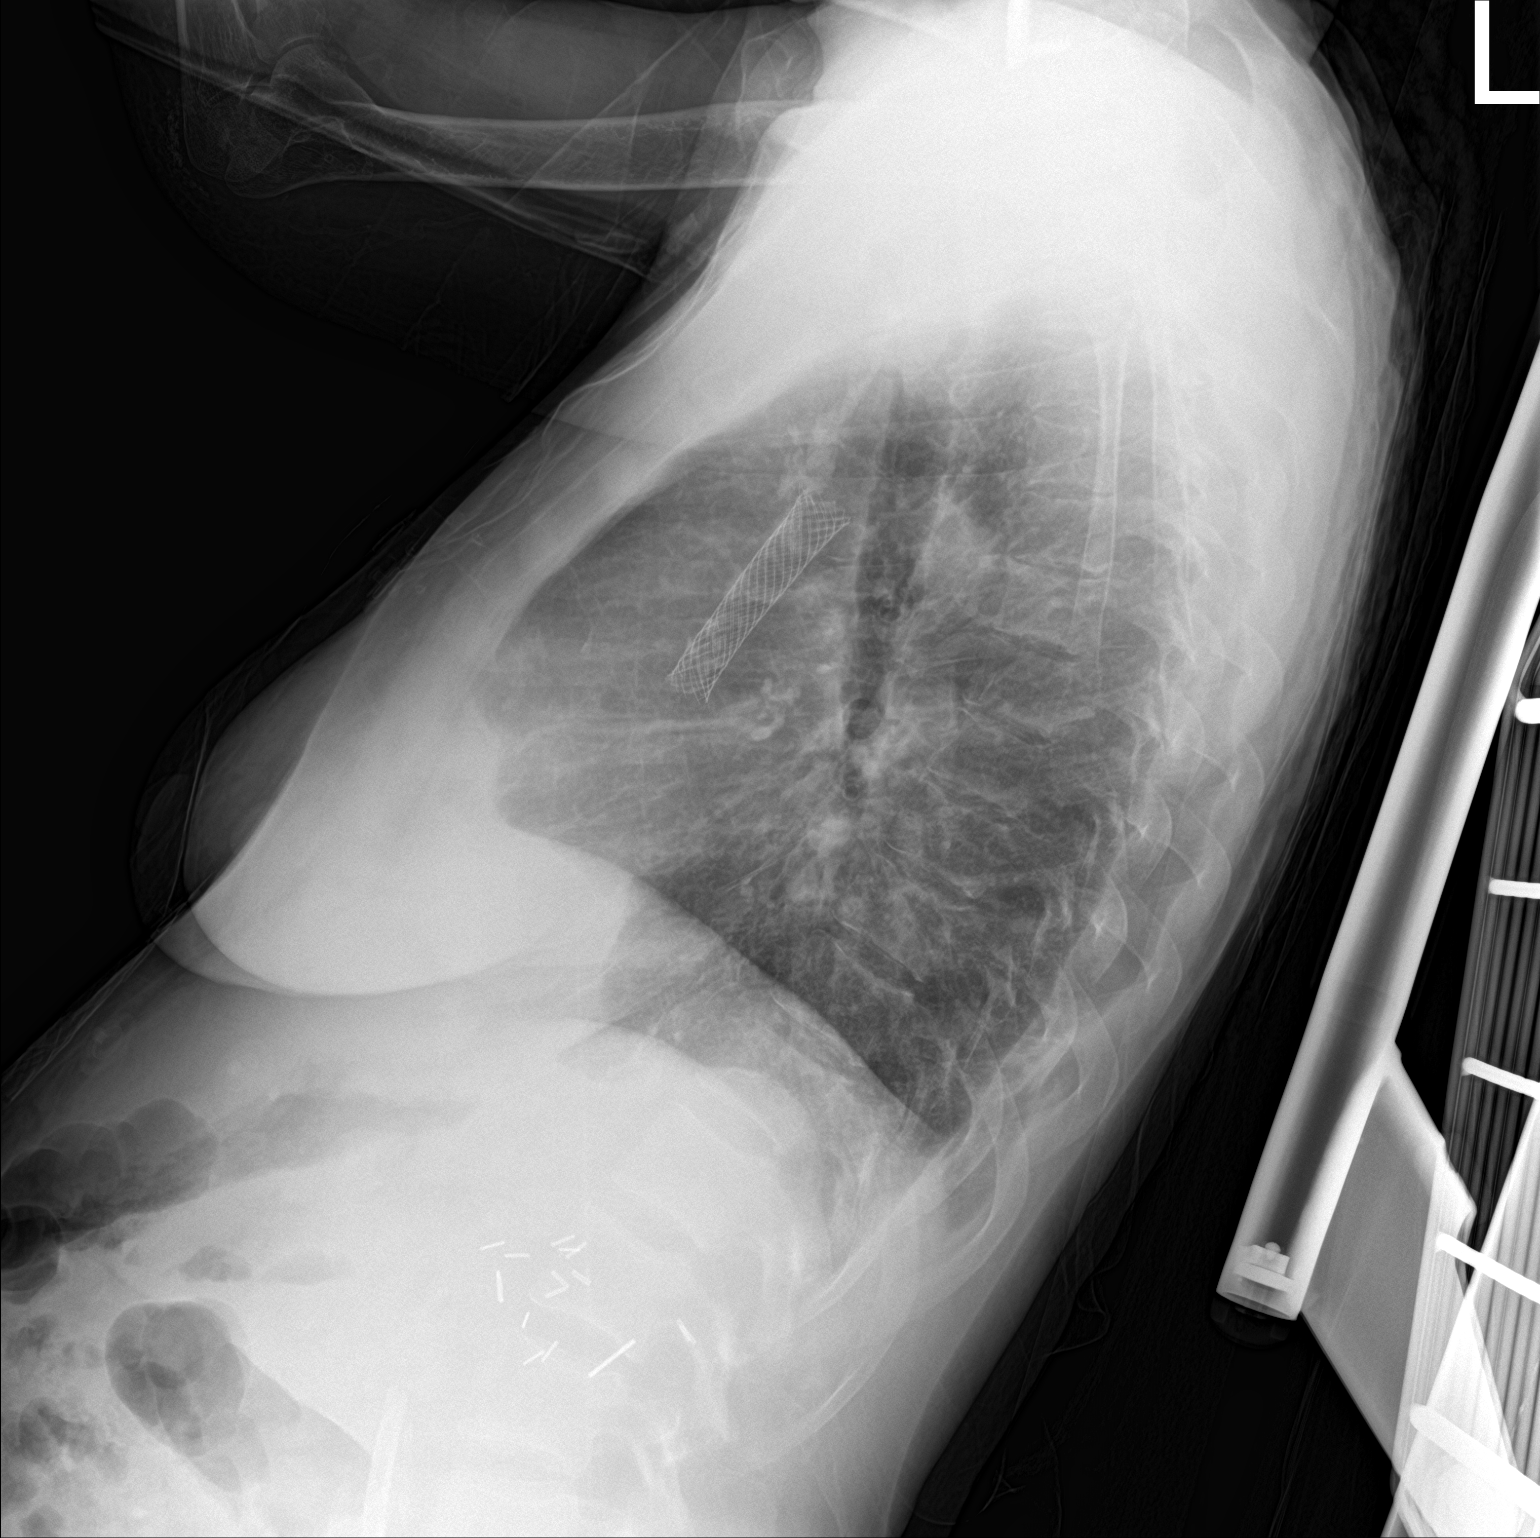

[chest ap]
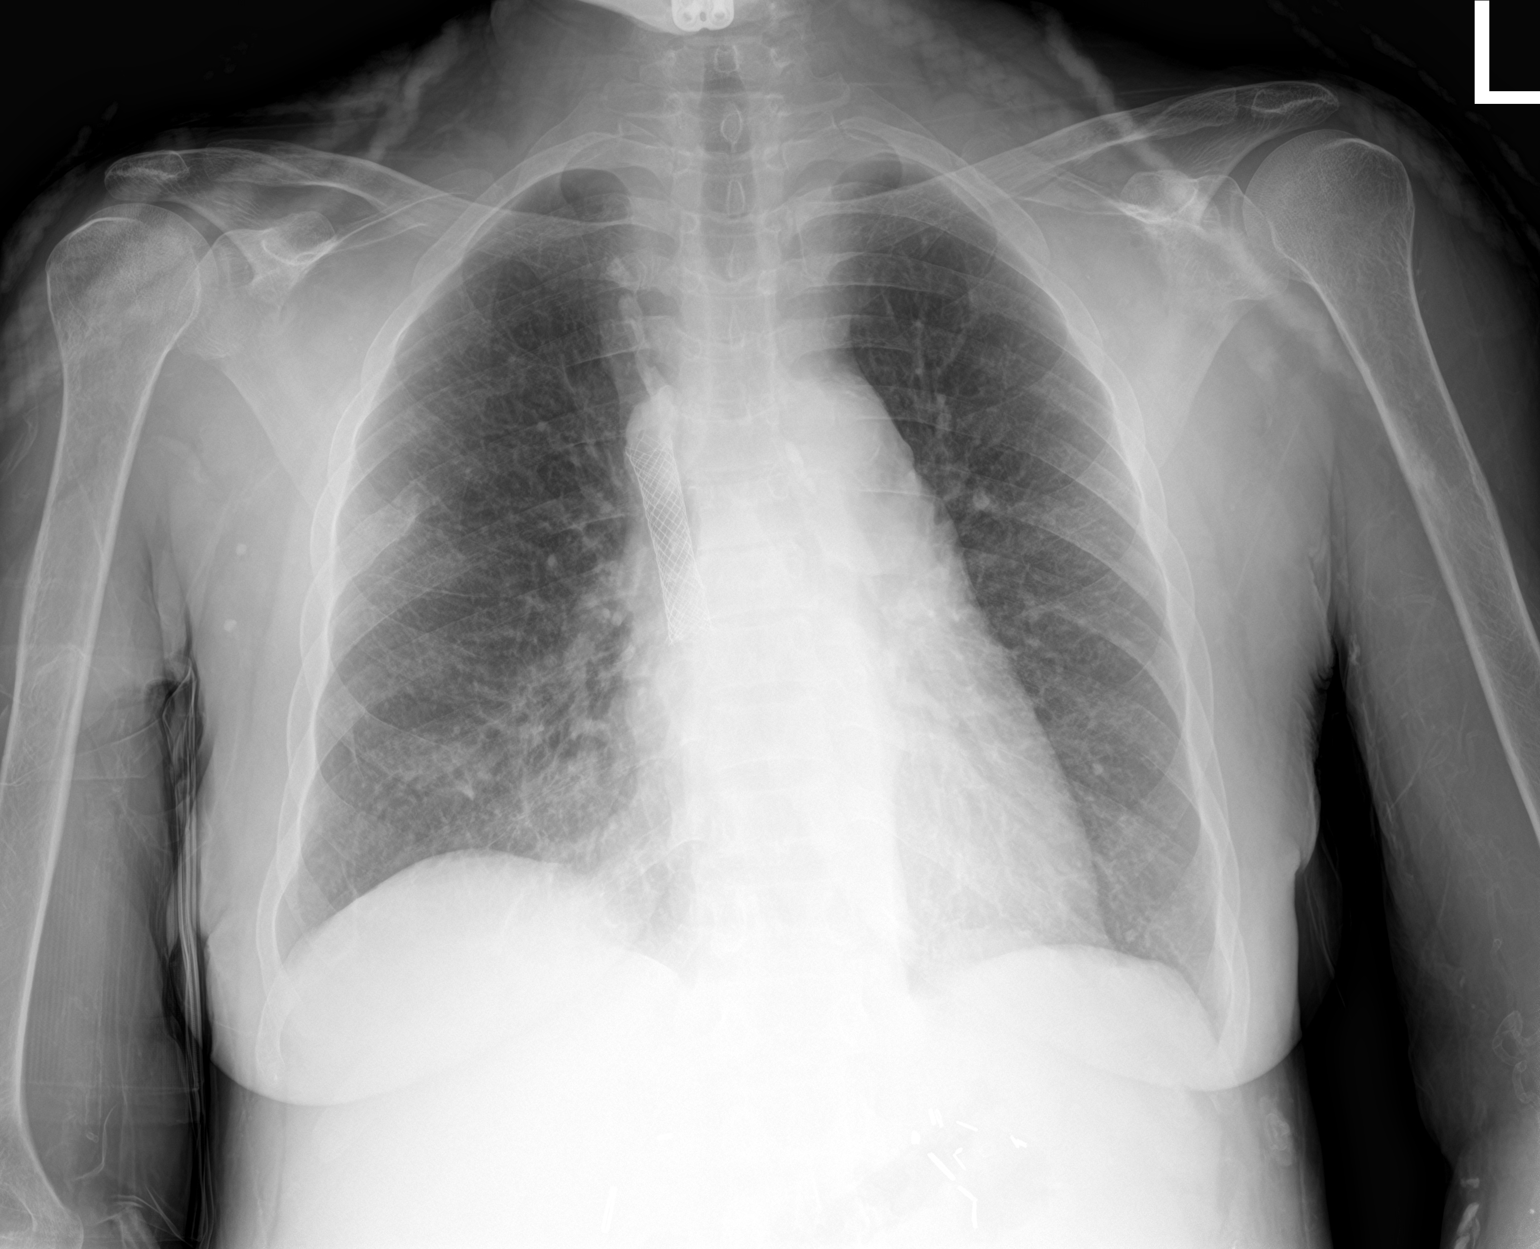

[2 of 2 positions shown; findings below may reference images not displayed]

FINDINGS: Cardiac silhouette is normal in size. No mediastinal or hilar
masses. Superior vena cava stent is stable as are calcifications
extending superior to the stent. No mediastinal or hilar masses or
evidence of adenopathy.

Clear lungs.  No pleural effusion or pneumothorax.

Skeletal structures are intact.
IMPRESSION: No acute cardiopulmonary disease.

## 2023-04-30 ENCOUNTER — Encounter

## 2023-04-30 ENCOUNTER — Ambulatory Visit: Admitting: Oncology

## 2023-04-30 ENCOUNTER — Ambulatory Visit

## 2023-04-30 ENCOUNTER — Encounter: Admitting: General Practice

## 2023-05-04 NOTE — Death Summary Note (Addendum)
 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 Left-sided pleural effusion status post chest tube placement Septic shock due to postobstructive pneumonia Pancytopenia due to chemotherapy/sepsis End-stage renal disease on hemodialysis Paroxysmal A-fib Depression DNR status Hypokalemia Moderate protein calorie malnutrition  Diagnoses  Preliminary cause of death: Withdrawal of care in the setting of multisystem organ failure with metastatic adenocarcinoma of lung to liver Secondary Diagnoses (including complications and co-morbidities):  Principal Problem:   Septic shock (HCC) Active Problems:   ESRD (end stage renal disease) (HCC)   Acute on chronic anemia   CHF (congestive heart failure) (HCC)   Acute on chronic respiratory failure (HCC)   Metastatic adenocarcinoma to lung (HCC)   Pleural effusion   Antineoplastic chemotherapy induced pancytopenia (HCC)   Malnutrition of moderate degree   Malignant pleural effusion   Pressure injury of skin   Brief Hospital Course (including significant findings, care, treatment, and services provided and events leading to death)  ZYLAH ELSBERND is a 58 y.o. year old female who 58 y.o. female with medical history significant of metastatic lung adenocarcinoma, suspected pancreatic adenocarcinoma, ESRD on hemodialysis, CHF who presented to the ed due shortness of breath.  Patient went to dialysis today and was hypotensive so was unable to complete her session.  She was transported to the emergency department where  she found to be afebrile and hypotensive with systolics in the 80s.  Chest ray was obtained which showed a large left pleural effusion with near complete opacification of the left lung.  Labs showed respiratory viral panel negative, potassium 3.0, creatinine 1.6, troponin and 29, 30.  Lactic acid 2.2.  She had a Thoracentesis 3/10 for 550 cc fluid   Patient had recurrent effusion, with left-sided chest tube was placed, 700 cc of fluid drained, repeat chest CT still showing opacification of left lung probably related to adenocarcinoma of lung and postobstructive pneumonia.  Patient continued antibiotic without much improvement her respiratory status continued to decline, goals of care discussions were carried with family, patient's family decided to keep her DNR/DNI and proceed with comfort care.  Patient passed on 05-13-23 at 11:20 AM.  Patient's family was at bedside   Pertinent Labs and Studies  Significant Diagnostic Studies VAS [name] LOWER EXTREMITY VENOUS (DVT) Result Date: 04/19/2023  Lower Venous DVT Study Patient Name:  Tina Watkins  Date of Exam:   04/19/2023 Medical Rec #: [id number]          Accession #:    [id number] Date of Birth: Mar 26, 1965          Patient Gender: F Patient Age:   89 years Exam Location:  [facility] Procedure:      VAS [name] LOWER EXTREMITY VENOUS (DVT) Referring Phys: Levon Hedger --------------------------------------------------------------------------------  Indications: Edema.  Risk Factors: Cancer lung adenocarcinoma. Limitations: Poor ultrasound/tissue interface, bandages and Patient positioning. Comparison Study: No significant changes seen since prior exam 05/16/19 Performing Technologist: Shona Simpson  Examination Guidelines: A complete evaluation includes B-mode imaging, spectral Doppler, color Doppler, and power Doppler as needed of all accessible portions of each vessel. Bilateral  testing is considered an integral part of a complete examination. Limited  examinations for reoccurring indications may be performed as noted. The reflux portion of the exam is performed with the patient in reverse Trendelenburg.  +---------+---------------+---------+-----------+----------+--------------+ RIGHT    CompressibilityPhasicitySpontaneityPropertiesThrombus Aging +---------+---------------+---------+-----------+----------+--------------+ CFV                                                   Obstructed     +---------+---------------+---------+-----------+----------+--------------+ SFJ                                                   Obstructed     +---------+---------------+---------+-----------+----------+--------------+ FV Prox  Full                                                        +---------+---------------+---------+-----------+----------+--------------+ FV Mid   Full                                                        +---------+---------------+---------+-----------+----------+--------------+ FV DistalFull                                                        +---------+---------------+---------+-----------+----------+--------------+ PFV      Full                                                        +---------+---------------+---------+-----------+----------+--------------+ POP      Full           Yes      Yes                                 +---------+---------------+---------+-----------+----------+--------------+ PTV      Full                                                        +---------+---------------+---------+-----------+----------+--------------+ PERO     Full                                                        +---------+---------------+---------+-----------+----------+--------------+ Right CFV and SFJ obstructed by bandages  +---------+---------------+---------+-----------+----------+--------------+ LEFT     CompressibilityPhasicitySpontaneityPropertiesThrombus Aging  +---------+---------------+---------+-----------+----------+--------------+  CFV      Full           Yes      Yes                                 +---------+---------------+---------+-----------+----------+--------------+ SFJ      Full                                                        +---------+---------------+---------+-----------+----------+--------------+ FV Prox  Full                                                        +---------+---------------+---------+-----------+----------+--------------+ FV Mid   Full                                                        +---------+---------------+---------+-----------+----------+--------------+ FV DistalFull                                                        +---------+---------------+---------+-----------+----------+--------------+ PFV      Full                                                        +---------+---------------+---------+-----------+----------+--------------+ POP      Full           Yes      Yes                                 +---------+---------------+---------+-----------+----------+--------------+ PTV      Full                                                        +---------+---------------+---------+-----------+----------+--------------+ PERO     Full                                                        +---------+---------------+---------+-----------+----------+--------------+     Summary: BILATERAL: - No evidence of deep vein thrombosis seen in the lower extremities, bilaterally. -No evidence of popliteal cyst, bilaterally.   *See table(s) above for measurements and observations. Electronically signed by Gerarda Fraction on 04/19/2023 at 5:32:41 PM.    Final    CT CHEST WO CONTRAST Result  Date: 04/19/2023 CLINICAL DATA:  Pleural effusion.  Malignancy is suspected. EXAM: CT CHEST WITHOUT CONTRAST TECHNIQUE: Multidetector CT imaging of the chest was performed following the  standard protocol without IV contrast. RADIATION DOSE REDUCTION: This exam was performed according to the departmental dose-optimization program which includes automated exposure control, adjustment of the mA and/or kV according to patient size and/or use of iterative reconstruction technique. COMPARISON:  Chest CT dated 04/18/2023. FINDINGS: Evaluation of this exam is limited in the absence of intravenous contrast. Cardiovascular: There is no cardiomegaly or pericardial effusion. There is hypoattenuation of the cardiac blood pool suggestive of anemia. Clinical correlation is recommended. There is coronary vascular calcification. SVC stent. Mild atherosclerotic calcification of the thoracic aorta. There is dilatation of the main pulmonary trunk suggestive of pulmonary hypertension. Mediastinum/Nodes: Partially calcified enlarged mediastinal lymph nodes. No mediastinal fluid collection. Lungs/Pleura: Complete consolidation of the left upper lobe. There is slight aeration of the left lower lobe. There has been interval placement of a left-sided chest tube with tip along the left cardiophrenic angle. Slight decrease in the left pleural effusion. Small amount of air within the left pleural space, likely iatrogenic. Small right pleural effusion and right lung base atelectasis innumerable pulmonary nodules in keeping with metastatic disease. The central airways are patent. Upper Abdomen: Multiple liver metastatic disease. Hypodense lesions of the tail of the pancreas. Musculoskeletal: Diffuse subcutaneous edema and anasarca. Renal osteodystrophy. No acute osseous pathology. IMPRESSION: 1. Interval placement of a left-sided chest tube with slight decrease in the left pleural effusion and slight aeration of the left lower lobe. 2. Complete consolidation of the left upper lobe. 3. Small left pneumothorax, likely iatrogenic. 4. Innumerable pulmonary nodules in keeping with metastatic disease. 5. Multiple liver metastatic  disease. 6. Hypodense lesions of the tail of the pancreas. Electronically Signed   By: Elgie Collard M.D.   On: 04/19/2023 13:17   DG Chest Port 1 View Result Date: 04/19/2023 CLINICAL DATA:  Chest tube in place. EXAM: PORTABLE CHEST 1 VIEW COMPARISON:  None Available. FINDINGS: Near complete opacification of the LEFT hemithorax. There is some increased lucency at the inferolateral LEFT lung base. LEFT chest tube in place. Vascular stent noted in the SVC. Large bore venous catheter in the inferior vena cava. Multiple nodules noted in the RIGHT lung not changed. IMPRESSION: 1. Mild improvement aeration at the LEFT lung base. 2. Persistent near complete opacification LEFT hemithorax. Chest tube in place. Electronically Signed   By: Genevive Bi M.D.   On: 04/19/2023 10:04   DG Chest Port 1 View Result Date: 04/18/2023 CLINICAL DATA:  Follow-up chest tube. EXAM: PORTABLE CHEST 1 VIEW COMPARISON:  04/17/2023 and older exams. FINDINGS: New inferior left hemithorax pigtail chest tube, tip projecting at the medial left costophrenic sulcus. Slight improvement in left lung aeration. Specifically, there is partially aerated lung visible the left lateral lower lung, not present on the prior day's exam. Remainder of the left hemithorax remains opacified. Numerous ill-defined right pulmonary nodules are stable from the previous day's exam. No pneumothorax. Stable femoral a placed catheter and vascular stent, the latter overlying the right superior hila. IMPRESSION: 1. New left chest tube. No pneumothorax. 2. Small area of partial left lower lateral lung aeration representing a change from the previous day's exam. Remainder of the left hemithorax is still opacified. Electronically Signed   By: Amie Portland M.D.   On: 04/18/2023 14:48   CT CHEST WO CONTRAST Result Date: 04/18/2023 CLINICAL DATA:  Shortness of breath EXAM:  CT CHEST WITHOUT CONTRAST TECHNIQUE: Multidetector CT imaging of the chest was performed  following the standard protocol without IV contrast. RADIATION DOSE REDUCTION: This exam was performed according to the departmental dose-optimization program which includes automated exposure control, adjustment of the mA and/or kV according to patient size and/or use of iterative reconstruction technique. COMPARISON:  Chest x-ray dated a. Chest CT 02/22/2023 FINDINGS: Cardiovascular: Heart is normal size. Aorta is normal caliber. Moderate coronary artery and aortic calcifications. SVC stent noted. Dialysis catheter enters the heart from the IVC with the tip in the right atrium. Mediastinum/Nodes: Mediastinal adenopathy again noted. Right paratracheal lymph node with a short axis diameter of 2.8 cm, formally 2.9 cm. Subcarinal adenopathy with a short axis diameter of 2.5 cm, previously 2 cm. Partially calcified pre-vascular adenopathy. No axillary adenopathy. Cannot assess the hila without IV contrast. Lungs/Pleura: Innumerable small pulmonary nodules throughout the right lung, increasing in size and number. Airspace disease in the right lung base in both the right middle lobe and right lower lobe. Complete opacification of the left hemithorax with a combination of moderate to large pleural effusion and atelectasis throughout the left lung. Upper Abdomen: Numerous low-density lesions again seen throughout the liver. Right hepatic dome lesion appears somewhat irregular and measures 1.6 cm compared to 9 mm previously. Musculoskeletal: No suspicious focal bony abnormality. IMPRESSION: Complete opacification of the left hemithorax with a combination of pleural effusion and left lung atelectasis/collapse. Extensive mediastinal adenopathy, similar to prior study. Innumerable small nodules throughout the right lung which appear to have increased increasing in size and number. Airspace disease in the right lung base in the right lower lobe and right middle lobe could reflect atelectasis, pneumonia or tumor. Numerous  low-density lesions throughout the visualized liver, some of which have increased in size since prior study. Coronary artery disease. Aortic Atherosclerosis (ICD10-I70.0). Electronically Signed   By: Charlett Nose M.D.   On: 04/18/2023 01:05   DG Chest Port 1 View Result Date: 04/17/2023 CLINICAL DATA:  Chest pain EXAM: PORTABLE CHEST 1 VIEW COMPARISON:  04/12/2023 FINDINGS: Dialysis catheter enters the heart from below with the tip in the right atrium, unchanged. SVC stent in place. Complete opacification of the left hemithorax, worsened since prior study. Extensive small nodules throughout the right lung are unchanged. IMPRESSION: Complete opacification of the left hemithorax, progressed since prior study. Diffuse pulmonary nodules throughout the right lung. Electronically Signed   By: Charlett Nose M.D.   On: 04/17/2023 17:27   DG Chest 1 View Result Date: 04/12/2023 CLINICAL DATA:  Status post thoracentesis. EXAM: CHEST  1 VIEW COMPARISON:  Radiograph 04/10/2023. FINDINGS: Subtotal opacification of the left hemithorax. Small amount of air in the left lung apex is new from prior, unclear if this represents aerated lung or a small focus of extrapleural air. Vascular stent in the right mediastinum. Mediastinal contours are obscured. Catheter from an inferior approach with tip in the right atrium. Diffuse pulmonary nodules in the right lung. IMPRESSION: 1. Subtotal opacification of the left hemithorax, likely a combination of pleural fluid and atelectasis. Small amount of air in the left lung apex is new from prior, unclear if this represents aerated lung or a small focus of extrapleural air. 2. Diffuse pulmonary nodules in the right lung. Electronically Signed   By: Narda Rutherford M.D.   On: 04/12/2023 18:49   IR THORACENTESIS ASP PLEURAL SPACE W/IMG GUIDE Result Date: 04/12/2023 INDICATION: 58 year old female with left upper lobe malignancy, with left pleural effusion, shortness of breath.  IR was  requested for therapeutic thoracentesis. EXAM: ULTRASOUND GUIDED THERAPEUTIC LEFT-SIDED THORACENTESIS MEDICATIONS: 7 cc of 1% lidocaine COMPLICATIONS: None immediate. PROCEDURE: An ultrasound guided thoracentesis was thoroughly discussed with the patient and questions answered. The benefits, risks, alternatives and complications were also discussed. The patient understands and wishes to proceed with the procedure. Written consent was obtained. Ultrasound was performed to localize and mark an adequate pocket of fluid in the left chest. The area was then prepped and draped in the normal sterile fashion. 1% Lidocaine was used for local anesthesia. Under ultrasound guidance a 6 Fr Safe-T-Centesis catheter was introduced. Thoracentesis was performed. The catheter was removed and a dressing applied. FINDINGS: A total of approximately 550 cc of clear, amber colored fluid was removed. IMPRESSION: Successful ultrasound guided left thoracentesis yielding 550 cc of pleural fluid. Procedure performed by Buzzy Han, PA-C Electronically Signed   By: Irish Lack M.D.   On: 04/12/2023 13:36   DG Chest Portable 1 View Result Date: 04/10/2023 CLINICAL DATA:  Chest pain EXAM: PORTABLE CHEST 1 VIEW COMPARISON:  03/20/2023 FINDINGS: Large left pleural effusion is present with near complete atelectasis of the left lung. Miliary nodularity is seen throughout the right lung in keeping with miliary pulmonary metastatic disease given patient's history of lung and hepatocellular carcinoma. No pneumothorax. No pleural effusion on the right. Low superior vena caval stent extends into the superior right atrium. Inferiorly approaching hemodialysis catheter tip noted within the high right atrium. Left upper extremity midline catheter tip overlies the left hemithorax likely within a chest wall collateral. Cardiac silhouette partially obscured by left pleural effusion. Pulmonary vascularity is normal within the aerated right lung. No acute  bone abnormality. IMPRESSION: 1. Large left pleural effusion with near complete atelectasis of the left lung. 2. Miliary nodularity throughout the right lung in keeping with miliary pulmonary metastatic disease given patient's history of lung and hepatocellular carcinoma. 3. Left upper extremity midline catheter tip overlies the left hemithorax likely within a chest wall collateral. Electronically Signed   By: Helyn Numbers M.D.   On: 04/10/2023 21:45   IR Fluoro Guide CV Line Right Result Date: 03/29/2023 INDICATION: End-stage renal disease, poorly functioning chronic right femoral HD catheter EXAM: FLUOROSCOPIC EXCHANGE OF THE RIGHT FEMORAL HD CATHETER IVC CATHETERIZATION AND VENOGRAM MEDICATIONS: ANCEF 2 G; The antibiotic was administered within an appropriate time interval prior to skin puncture. ANESTHESIA/SEDATION: NONE. FLUOROSCOPY: Radiation Exposure Index (as provided by the fluoroscopic device): 99 mGy Kerma COMPLICATIONS: None immediate. PROCEDURE: Informed written consent was obtained from the patient after a thorough discussion of the procedural risks, benefits and alternatives. All questions were addressed. Maximal Sterile Barrier Technique was utilized including caps, mask, sterile gowns, sterile gloves, sterile drape, hand hygiene and skin antiseptic. A timeout was performed prior to the initiation of the procedure. Initial fluoroscopic survey demonstrates a right femoral HD catheter. Tip terminates at the lower IVC below the renal veins. Catheter removed over a Glidewire. A 12 French sheath was advanced. IVC venogram performed. IVC is irregular and diffusely stenotic with azygous collateralization. Kumpe catheter and Glidewire advanced through the sheath. Access manipulated through the multifocal IVC irregularity and stenoses. Catheter and guidewire access advanced to successfully regain access into the right atrium. Contrast injection confirms position in the right atrium. Measurements obtained  for the appropriate catheter length. Over the Amplatz and stiff Glidewire, a 36 cm palindrome catheter was advanced with some difficulty. Tip positioned eventually in the inferior right atrium. Contrast injection confirms position. Blood aspirated  easily followed by saline and heparin flushes. Appropriate volume and strength of heparin instilled in all lumens. Catheter secured at the right femoral access site with a Prolene suture and a sterile dressing. No immediate complication. Patient tolerated the procedure well. IMPRESSION: Successful exchange and reposition of the tunneled right femoral HD catheter. Tip now terminates in the inferior right atrium. Electronically Signed   By: Judie Petit.  Shick M.D.   On: 03/29/2023 12:09   IR Venocavagram Ivc Result Date: 03/29/2023 INDICATION: End-stage renal disease, poorly functioning chronic right femoral HD catheter EXAM: FLUOROSCOPIC EXCHANGE OF THE RIGHT FEMORAL HD CATHETER IVC CATHETERIZATION AND VENOGRAM MEDICATIONS: ANCEF 2 G; The antibiotic was administered within an appropriate time interval prior to skin puncture. ANESTHESIA/SEDATION: NONE. FLUOROSCOPY: Radiation Exposure Index (as provided by the fluoroscopic device): 99 mGy Kerma COMPLICATIONS: None immediate. PROCEDURE: Informed written consent was obtained from the patient after a thorough discussion of the procedural risks, benefits and alternatives. All questions were addressed. Maximal Sterile Barrier Technique was utilized including caps, mask, sterile gowns, sterile gloves, sterile drape, hand hygiene and skin antiseptic. A timeout was performed prior to the initiation of the procedure. Initial fluoroscopic survey demonstrates a right femoral HD catheter. Tip terminates at the lower IVC below the renal veins. Catheter removed over a Glidewire. A 12 French sheath was advanced. IVC venogram performed. IVC is irregular and diffusely stenotic with azygous collateralization. Kumpe catheter and Glidewire advanced  through the sheath. Access manipulated through the multifocal IVC irregularity and stenoses. Catheter and guidewire access advanced to successfully regain access into the right atrium. Contrast injection confirms position in the right atrium. Measurements obtained for the appropriate catheter length. Over the Amplatz and stiff Glidewire, a 36 cm palindrome catheter was advanced with some difficulty. Tip positioned eventually in the inferior right atrium. Contrast injection confirms position. Blood aspirated easily followed by saline and heparin flushes. Appropriate volume and strength of heparin instilled in all lumens. Catheter secured at the right femoral access site with a Prolene suture and a sterile dressing. No immediate complication. Patient tolerated the procedure well. IMPRESSION: Successful exchange and reposition of the tunneled right femoral HD catheter. Tip now terminates in the inferior right atrium. Electronically Signed   By: Judie Petit.  Shick M.D.   On: 03/29/2023 12:09    Microbiology Recent Results (from the past 240 hours)  MRSA Next Gen by PCR, Nasal     Status: None   Collection Time: 04/11/23  1:56 PM   Specimen: Nasal Mucosa; Nasal Swab  Result Value Ref Range Status   MRSA by PCR Next Gen NOT DETECTED NOT DETECTED Final    Comment: (NOTE) The GeneXpert MRSA Assay (FDA approved for NASAL specimens only), is one component of a comprehensive MRSA colonization surveillance program. It is not intended to diagnose MRSA infection nor to guide or monitor treatment for MRSA infections. Test performance is not FDA approved in patients less than 47 years old. Performed at [facility], [address] [place], [place] [address]   Resp panel by RT-PCR (RSV, Flu A&B, Covid) Anterior Nasal Swab     Status: None   Collection Time: 04/17/23  4:42 PM   Specimen: Anterior Nasal Swab  Result Value Ref Range Status   SARS Coronavirus 2 by RT PCR NEGATIVE NEGATIVE Final   Influenza A by PCR  NEGATIVE NEGATIVE Final   Influenza B by PCR NEGATIVE NEGATIVE Final    Comment: (NOTE) The Xpert Xpress SARS-CoV-2/FLU/RSV plus assay is intended as an aid  in the diagnosis of influenza from Nasopharyngeal swab specimens and should not be used as a sole basis for treatment. Nasal washings and aspirates are unacceptable for Xpert Xpress SARS-CoV-2/FLU/RSV testing.  Fact Sheet for Patients: BloggerCourse.com  Fact Sheet for Healthcare Providers: SeriousBroker.it  This test is not yet approved or cleared by the [place] FDA and has been authorized for detection and/or diagnosis of SARS-CoV-2 by FDA under an Emergency Use Authorization (EUA). This EUA will remain in effect (meaning this test can be used) for the duration of the COVID-19 declaration under Section 564(b)(1) of the Act, 21 U.S.C. section 360bbb-3(b)(1), unless the authorization is terminated or revoked.     Resp Syncytial Virus by PCR NEGATIVE NEGATIVE Final    Comment: (NOTE) Fact Sheet for Patients: BloggerCourse.com  Fact Sheet for Healthcare Providers: SeriousBroker.it  This test is not yet approved or cleared by the [place] FDA and has been authorized for detection and/or diagnosis of SARS-CoV-2 by FDA under an Emergency Use Authorization (EUA). This EUA will remain in effect (meaning this test can be used) for the duration of the COVID-19 declaration under Section 564(b)(1) of the Act, 21 U.S.C. section 360bbb-3(b)(1), unless the authorization is terminated or revoked.  Performed at [facility], [address] [place], [place] [address]   Culture, blood (routine x 2)     Status: None (Preliminary result)   Collection Time: 04/17/23  8:32 PM   Specimen: BLOOD RIGHT ARM  Result Value Ref Range Status   Specimen Description BLOOD RIGHT ARM  Final   Special Requests   Final    BOTTLES DRAWN  AEROBIC AND ANAEROBIC Blood Culture adequate volume   Culture   Final    NO GROWTH 4 DAYS Performed at [facility], [address] [place], [place] [address]    Report Status PENDING  Incomplete  MRSA Next Gen by PCR, Nasal     Status: None   Collection Time: 04/18/23  2:18 AM   Specimen: Nasal Mucosa; Nasal Swab  Result Value Ref Range Status   MRSA by PCR Next Gen NOT DETECTED NOT DETECTED Final    Comment: (NOTE) The GeneXpert MRSA Assay (FDA approved for NASAL specimens only), is one component of a comprehensive MRSA colonization surveillance program. It is not intended to diagnose MRSA infection nor to guide or monitor treatment for MRSA infections. Test performance is not FDA approved in patients less than 50 years old. Performed at [facility], [address] [place], [place] [address]   Culture, blood (routine x 2)     Status: None (Preliminary result)   Collection Time: 04/18/23  3:03 AM   Specimen: BLOOD RIGHT HAND  Result Value Ref Range Status   Specimen Description BLOOD RIGHT HAND  Final   Special Requests   Final    BOTTLES DRAWN AEROBIC AND ANAEROBIC Blood Culture adequate volume   Culture   Final    NO GROWTH 3 DAYS Performed at [facility], [address] [place], [place] [address]    Report Status PENDING  Incomplete  Body fluid culture w Gram Stain     Status: None (Preliminary result)   Collection Time: 04/18/23  4:28 PM   Specimen: Pleural Fluid  Result Value Ref Range Status   Specimen Description PLEURAL  Final   Special Requests NONE  Final   Gram Stain RARE WBC SEEN NO ORGANISMS SEEN   Final   Culture   Final  NO GROWTH 3 DAYS Performed at [facility], [address] [place], [place] [address]    Report Status PENDING  Incomplete    Lab Basic Metabolic Panel: Recent Labs  Lab 04/17/23 1724 04/18/23 0306 04/19/23 0538 04/19/23 2033 04/20/23 0345 04/07/2023 0402  NA 139 139 135  --  139 137  K 3.0* 3.2*  4.8  --  4.6 3.6  CL 96* 94* 96*  --  99 96*  CO2 27 29 27   --  25 26  GLUCOSE 84 88 121*  --  100* 80  BUN 9 13 26*  --  37* 22*  CREATININE 1.62* 2.58* 4.17*  --  5.36* 3.20*  CALCIUM 8.1* 8.7* 9.8  --  11.2* 9.7  MG  --   --   --   --   --  2.1  PHOS  --   --   --  6.5* 7.0* 4.4   Liver Function Tests: Recent Labs  Lab 04/17/23 1724 04/18/23 0306 04/20/23 0345 04/18/2023 0402  AST 32 28  --   --   ALT 33 30  --   --   ALKPHOS 257* 240*  --   --   BILITOT 0.5 1.0  --   --   PROT 5.5* 5.8*  --   --   ALBUMIN 3.0* 3.0* 3.8 3.3*   No results for input(s): "LIPASE", "AMYLASE" in the last 168 hours. No results for input(s): "AMMONIA" in the last 168 hours. CBC: Recent Labs  Lab 04/17/23 1724 04/18/23 0306 04/19/23 0538 04/20/23 1030  WBC 1.3* 1.8* 11.7* 27.2*  NEUTROABS 0.4* 0.6*  --   --   HGB 8.1* 8.3* 7.1* 7.7*  HCT 25.3* 25.6* 22.4* 25.2*  MCV 93.7 93.1 93.7 96.9  PLT 40* 50* 64* 71*   Cardiac Enzymes: No results for input(s): "CKTOTAL", "CKMB", "CKMBINDEX", "TROPONINI" in the last 168 hours. Sepsis Labs: Recent Labs  Lab 04/17/23 1724 04/17/23 2038 04/18/23 0306 04/19/23 0538 04/20/23 1030  PROCALCITON  --   --  2.78  --   --   WBC 1.3*  --  1.8* 11.7* 27.2*  LATICACIDVEN  --  2.2* 2.4*  --   --     Procedures/Operations     [employer] 05/02/2023, 11:52 AM

## 2023-05-04 NOTE — Plan of Care (Signed)
     Referral previously received for Tina Watkins for goals of care discussion. Chart extensively reviewed and went to bedside. Received update from the RN that famikly had all arrived from [place] and [place]. Patient had just transitioned to comfort care. Large number of family in the room spending quality time with the patient.  I discussed with PCCM Selmer Dominion, NP and at this time I feel Palliative involvement would be more of a distraction and stressor than benefit, and she agrees. We will be available for any needs in the meantime. Anticipate in hospital death soon.  Please contact the palliative medicine provider on service for any new/urgent needs that require our assistance with this patient.  Thank you for your referral and allowing PMT to assist in Tina Watkins's care.   Wynne Dust, NP Palliative Medicine Team Phone: [phone]  NO CHARGE

## 2023-05-04 NOTE — Progress Notes (Signed)
 NAME:  Tina Watkins, MRN:  [id number], DOB:  [birth year], LOS: 4 ADMISSION DATE:  04/17/2023, CONSULTATION DATE:  04/18/2023 REFERRING MD:  Alan Mulder, MD, CHIEF COMPLAINT:  SOB   History of Present Illness:  58 y.o. female with medical history significant of metastatic lung adenocarcinoma, suspected pancreatic adenocarcinoma, ESRD on hemodialysis, CHF who presented to the ed due shortness of breath.  Patient went to dialysis today and was hypotensive so was unable to complete her session.  She was transported to the emergency department where she found to be afebrile and hypotensive with systolics in the 80s.  Chest ray was obtained which showed a large left pleural effusion with near complete opacification of the left lung.  Labs showed respiratory viral panel negative, potassium 3.0, creatinine 1.6, troponin and 29, 30.  Lactic acid 2.2.  She had a Thoracentesis 3/10 for 550 cc fluid.  She felt better and was d/c home..  Pertinent  Medical History  Metastatic Adenocarcinoma Lung  Significant Hospital Events: Including procedures, antibiotic start and stop dates in addition to other pertinent events   3/16: Admitted for SOB 3/16 left CT placed, output 3/17 off BiPAP, afebrile 3/19 plan to transition to comfort care when family has all arrived  Interim History / Subjective:  Over the last day patient's respiratory status has worsened, she was delirious and had worsening mental status, started on precedex Today, her mother and multiple family members are at the bedside and have all agreed that pursuing comfort-focused care would be most consistent with patient's wishes   Objective   Blood pressure 97/78, pulse 94, temperature 98 F (36.7 C), temperature source Axillary, resp. rate (!) 26, height 4\' 11"  (1.499 m), weight 47.8 kg, SpO2 95%.    Vent Mode: PSV;BIPAP FiO2 (%):  [50 %-60 %] 50 % PEEP:  [5 cmH20] 5 cmH20 Pressure Support:  [10 cmH20] 10 cmH20   Intake/Output  Summary (Last 24 hours) at 04/25/2023 4132 Last data filed at 04/28/2023 0700 Gross per 24 hour  Intake 336.88 ml  Output 159.9 ml  Net 176.98 ml   Filed Weights   04/17/23 1619  Weight: 47.8 kg   General:  critically ill appearing F on bipap in mild respiratory distress HEENT: MM pink/moist, Bipap mask in place Neuro: breathing independently, reaching for mask, not answering questions or following commands on precedex CV: s1s2 rrr, no m/r/g PULM:  poor air entry throughout with tachypnea and accessory muscle use on Bipap  GI: soft, non-distended  Extremities: warm/dry, no edema  Skin: no rashes or lesions    CT chest wo contrast 3/17>  1. Interval placement of a left-sided chest tube with slight decrease in the left pleural effusion and slight aeration of the left lower lobe. 2. Complete consolidation of the left upper lobe. 3. Small left pneumothorax, likely iatrogenic. 4. Innumerable pulmonary nodules in keeping with metastatic disease. 5. Multiple liver metastatic disease. 6. Hypodense lesions of the tail of the pancreas.  Resolved Hospital Problem list   N/a  Assessment & Plan:  Acute on chronic respiratory failure with hypoxia Metastatic lung adenocarcinoma, stage IV, mets> pleural space with pancreas and radiologic evidence to brain/ liver Left-sided pleural effusion status post chest tube placement Septic shock due to postobstructive pneumonia Pancytopenia due to chemotherapy/ sepsis, improving End-stage renal disease on hemodialysis Paroxysmal A-fib Chronic hypotension on midodrine GERD Depression Gout Deconditioning -continue supportive care with pressors prn and bipap, minimize discomfort until all family has arrived and then plan to transition  to full comfort care -family has requested chaplain support    Best Practice (right click and "Reselect all SmartList Selections" daily)   Per protocols CBC: Recent Labs  Lab 04/17/23 1724 04/18/23 0306  04/19/23 0538 04/20/23 1030  WBC 1.3* 1.8* 11.7* 27.2*  NEUTROABS 0.4* 0.6*  --   --   HGB 8.1* 8.3* 7.1* 7.7*  HCT 25.3* 25.6* 22.4* 25.2*  MCV 93.7 93.1 93.7 96.9  PLT 40* 50* 64* 71*    Basic Metabolic Panel: Recent Labs  Lab 04/17/23 1724 04/18/23 0306 04/19/23 0538 04/19/23 2033 04/20/23 0345 04/18/2023 0402  NA 139 139 135  --  139 137  K 3.0* 3.2* 4.8  --  4.6 3.6  CL 96* 94* 96*  --  99 96*  CO2 27 29 27   --  25 26  GLUCOSE 84 88 121*  --  100* 80  BUN 9 13 26*  --  37* 22*  CREATININE 1.62* 2.58* 4.17*  --  5.36* 3.20*  CALCIUM 8.1* 8.7* 9.8  --  11.2* 9.7  MG  --   --   --   --   --  2.1  PHOS  --   --   --  6.5* 7.0* 4.4   GFR: Estimated Creatinine Clearance: 13.5 mL/min (A) (by C-G formula based on SCr of 3.2 mg/dL (H)). Recent Labs  Lab 04/17/23 1724 04/17/23 2038 04/18/23 0306 04/19/23 0538 04/20/23 1030  PROCALCITON  --   --  2.78  --   --   WBC 1.3*  --  1.8* 11.7* 27.2*  LATICACIDVEN  --  2.2* 2.4*  --   --     Liver Function Tests: Recent Labs  Lab 04/17/23 1724 04/18/23 0306 04/20/23 0345 04/27/2023 0402  AST 32 28  --   --   ALT 33 30  --   --   ALKPHOS 257* 240*  --   --   BILITOT 0.5 1.0  --   --   PROT 5.5* 5.8*  --   --   ALBUMIN 3.0* 3.0* 3.8 3.3*   No results for input(s): "LIPASE", "AMYLASE" in the last 168 hours. No results for input(s): "AMMONIA" in the last 168 hours.  ABG    Component Value Date/Time   PHART 7.425 03/23/2019 1020   PCO2ART 32.9 03/23/2019 1020   PO2ART 131.0 (H) 03/23/2019 1020   HCO3 22.8 03/23/2019 1034   TCO2 24 11/16/2022 0636   ACIDBASEDEF 2.0 03/23/2019 1034   O2SAT 77.0 03/23/2019 1034     Coagulation Profile: Recent Labs  Lab 04/18/23 0306  INR 1.0    Cardiac Enzymes: No results for input(s): "CKTOTAL", "CKMB", "CKMBINDEX", "TROPONINI" in the last 168 hours.  HbA1C: Hemoglobin A1C  Date/Time Value Ref Range Status  07/14/2021 08:49 AM 4.0 4.0 - 5.6 % Final  05/08/2019 01:47 PM  4.8 4.0 - 5.6 % Final   HbA1c, POC (prediabetic range)  Date/Time Value Ref Range Status  07/14/2021 08:49 AM 4.0 (A) 5.7 - 6.4 % Final   HbA1c, POC (controlled diabetic range)  Date/Time Value Ref Range Status  07/14/2021 08:49 AM 4.0 0.0 - 7.0 % Final   HbA1c POC (<> result, manual entry)  Date/Time Value Ref Range Status  07/14/2021 08:49 AM 4.0 4.0 - 5.6 % Final   Hgb A1c MFr Bld  Date/Time Value Ref Range Status  12/10/2011 11:45 PM 5.5 <5.7 % Final    Comment:    (NOTE)  According to the ADA Clinical Practice Recommendations for 2011, when HbA1c is used as a screening test:  >=6.5%   Diagnostic of Diabetes Mellitus           (if abnormal result is confirmed) 5.7-6.4%   Increased risk of developing Diabetes Mellitus References:Diagnosis and Classification of Diabetes Mellitus,Diabetes Care,2011,34(Suppl 1):S62-S69 and Standards of Medical Care in         Diabetes - 2011,Diabetes Care,2011,34 (Suppl 1):S11-S61.    CBG: Recent Labs  Lab 04/18/23 1542  GLUCAP 92   CCT 40 mins  Darcella Gasman Margrit Minner, PA-C [facility] See Amion for pager If no response to pager , please call [phone] until 7pm After 7:00 pm call Elink  [phone]

## 2023-05-04 NOTE — Progress Notes (Signed)
   04/10/2023 1052  Spiritual Encounters  Type of Visit Initial  Care provided to: Pt and family  Conversation partners present during encounter Nurse  Referral source Nurse (RN/NT/LPN)  Reason for visit End-of-life   Reason for Visit: Chaplain responding to Page from the floor that family of EOL Pt is requesting a Orthoptist.  Time of Visit: 45 Minutes  Description of Visit: Chaplain arrived in room to find Pt lying in bed surrounded by family.  Neysa Bonito was present as well as his mother and sister, and Pt's aunt and uncle just arrived from [place].   Chaplain provided compassionate presence in the room while encouraging storytelling.  Family shared many stories of Pt's long medical journey and her strength to fight.  Chaplain provided end of life support and grief education as well as sharing Sherilyn Banker "4 Most Important Things."  Family proceeded to speak with Pt though she was unconscious.  Pt did rouse and open eyes at one point and family was all able to tell her they loved her and she shared that she was "so tired".  Pt became unconscious again.  Family spoke with one another and made decision to go full comfort care.  Chaplain excused himself to allow family privacy.  They have been instructed how to reach chaplain if they desire support again.   Plan of Care: No further spiritual care services are planned at this time unless the family reaches out and asks for [name] to return.  Chaplain services remain available by Spiritual Consult or for emergent cases, paging [phone]  Chaplain Raelene Bott, MDiv Raigan [email or web address] [phone]

## 2023-05-04 NOTE — IPAL (Signed)
 Interdisciplinary Goals of Care Family Meeting   Date carried out:: 04/09/2023  Location of the meeting: Bedside  Member's involved: Physician, Bedside Registered Nurse, and Family Member or next of kin  Durable Power of Attorney or acting medical decision maker: Revonda Humphrey and Caryl Asp   Discussion: We discussed goals of care for Tina Watkins .    The Clinical status was relayed to patient's significant other and patient's mother at bedside in detail.   Updated and notified of patients medical condition.     Patient remains minimally responsive on BiPAP Patient is having a weak cough and struggling to remove secretions.   Patient with increased WOB and using accessory muscles to breathe Explained that patient has metastatic lung cancer and one of her lung is not expanding, she is a struggling on BiPAP   Patient with Progressive multiorgan failure with a very high probablity of a very minimal chance of meaningful recovery despite all aggressive and optimal medical therapy.  Code status: Full DNR  Disposition: In-patient comfort care    Family are satisfied with Plan of action and management. All questions answered   Cheri Fowler MD [facility] See Amion for pager If no response to pager, please call [phone] until 7pm After 7pm, Please call E-link [phone]

## 2023-05-04 NOTE — TOC Progression Note (Signed)
 Transition of Care [facility]) - Progression Note    Patient Details  Name: Tina Watkins MRN: [id number] Date of Birth: [birth year]  [facility] CM/SW Contact  Graves-Bigelow, Lamar Laundry, RN Phone Number: 04/12/2023, 11:32 AM  Clinical Narrative: Family in the room with the patient. Case Manager assisted with providing information on calling the funeral home to discuss cremation. [employer] [facility] will receive the patient in the morgue to transport patient to the funeral home. No further needs identified from Case Manager at this time.  Expected Discharge Plan:  (Expired) Barriers to Discharge: No Barriers Identified  Expected Discharge Plan and Services In-house Referral: NA Discharge Planning Services: CM Consult Post Acute Care Choice: Home Health Living arrangements for the past 2 months: Apartment  Social Determinants of Health (SDOH) Interventions SDOH Screenings   Food Insecurity: No Food Insecurity (04/18/2023)  Housing: Low Risk  (04/18/2023)  Transportation Needs: No Transportation Needs (04/18/2023)  Utilities: Not At Risk (04/18/2023)  Alcohol Screen: Low Risk  (06/21/2022)  Depression (PHQ2-9): Low Risk  (01/06/2023)  Financial Resource Strain: Low Risk  (04/06/2023)  Physical Activity: Inactive (04/06/2023)  Social Connections: Moderately Isolated (04/11/2023)  Stress: Stress Concern Present (04/06/2023)  Tobacco Use: Medium Risk (04/17/2023)    Readmission Risk Interventions    04/19/2023   11:04 AM 04/12/2023    3:22 PM  Readmission Risk Prevention Plan  Transportation Screening Complete Complete  Medication Review (RN Care Manager) Referral to Pharmacy Referral to Pharmacy  PCP or Specialist appointment within 3-5 days of discharge  Complete  HRI or Home Care Consult Complete Complete  SW Recovery Care/Counseling Consult Complete Complete  Palliative Care Screening -- Not Applicable  Skilled Nursing Facility  Not Applicable

## 2023-05-04 DEATH — deceased

## 2023-05-12 ENCOUNTER — Ambulatory Visit: Admitting: Nurse Practitioner

## 2023-05-21 ENCOUNTER — Encounter

## 2023-05-21 ENCOUNTER — Ambulatory Visit

## 2023-05-21 ENCOUNTER — Ambulatory Visit: Admitting: Oncology

## 2023-06-02 ENCOUNTER — Ambulatory Visit: Payer: Medicare Other | Admitting: Emergency Medicine

## 2023-06-09 ENCOUNTER — Ambulatory Visit (HOSPITAL_COMMUNITY): Payer: Medicare Other

## 2023-06-13 IMAGING — DX DG CHEST 2V
2 series · 2 of 2 positions shown · non-contrast
Comparison: 02/08/2021

CLINICAL DATA: Dyspnea, chest pain

EXAM:
CHEST - 2 VIEW

[chest pa]
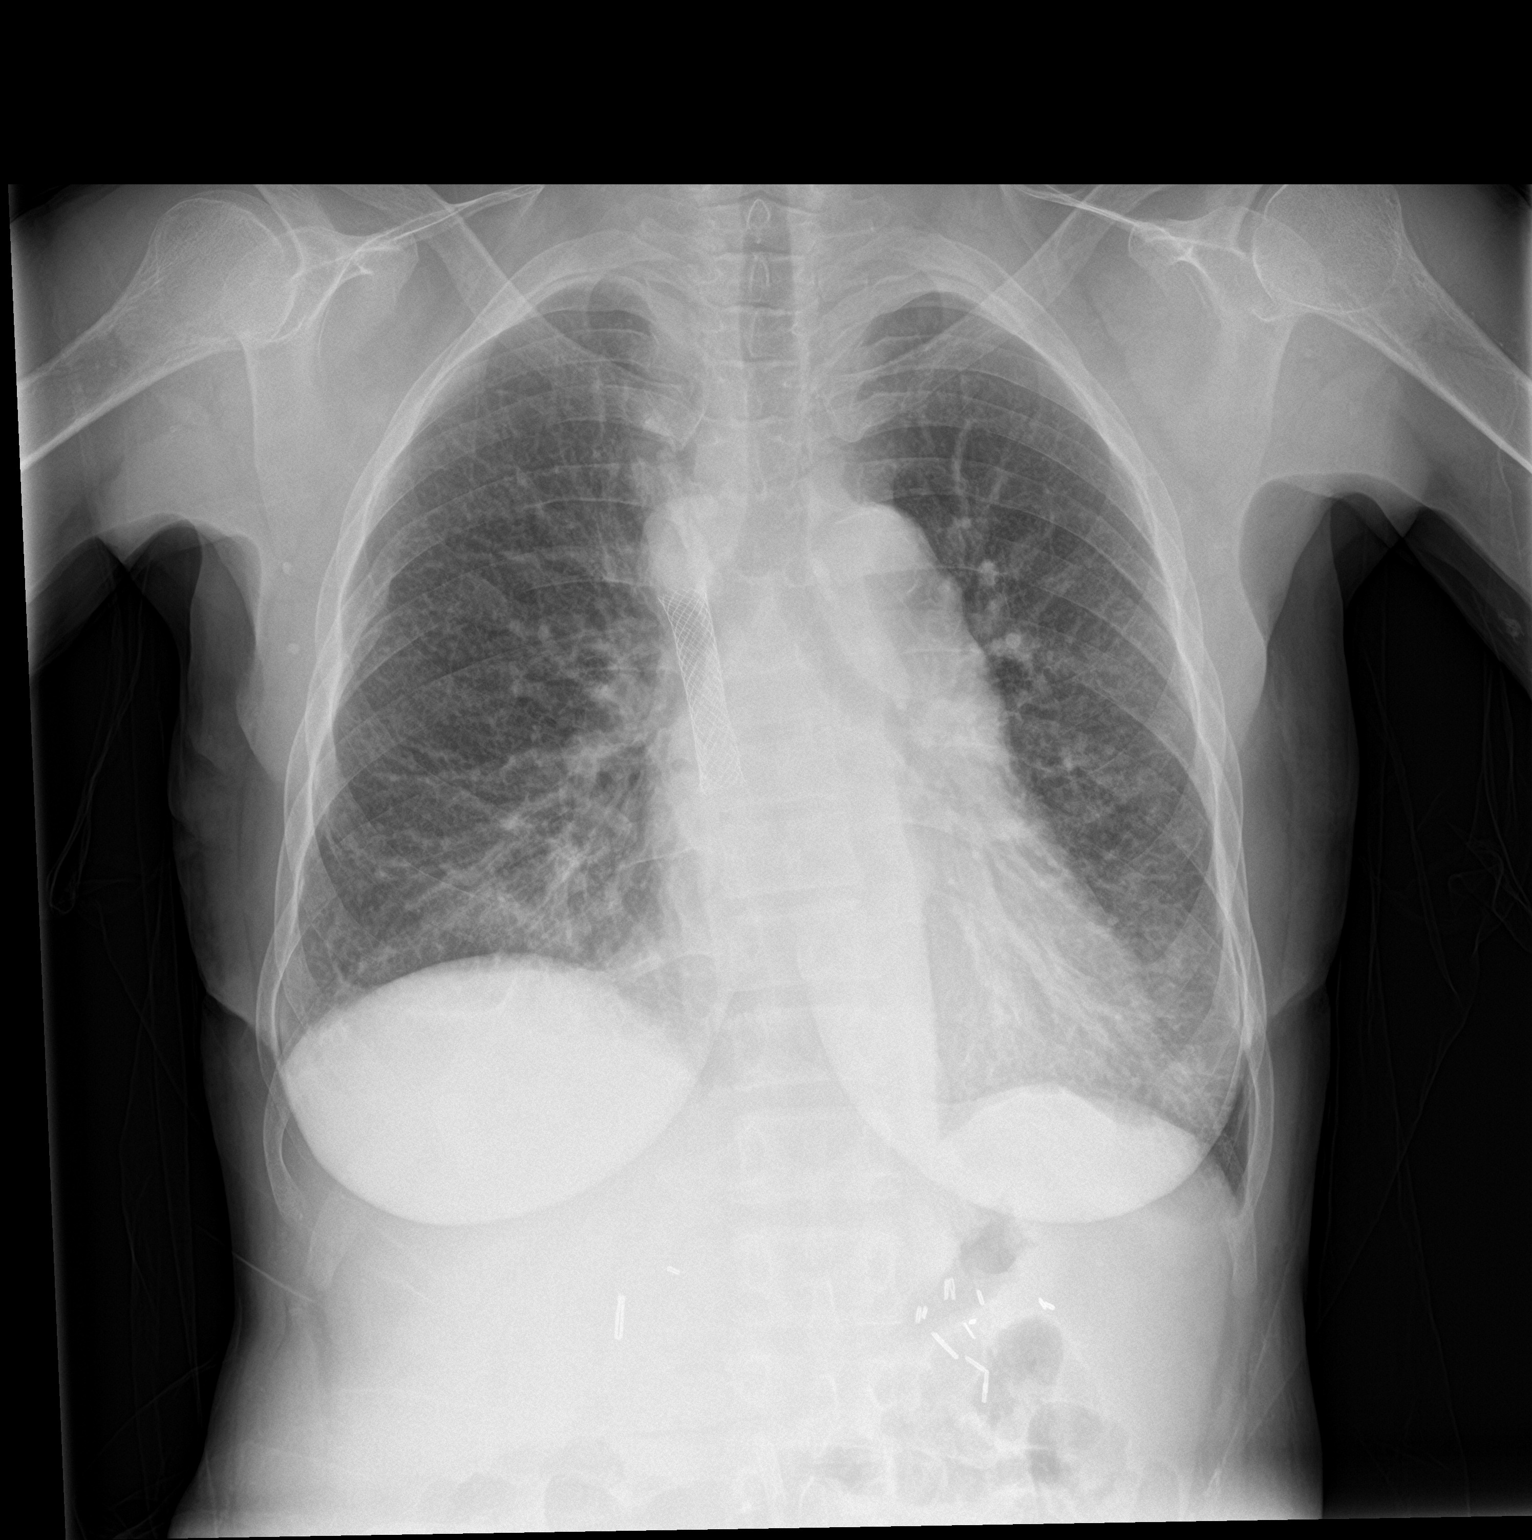

[chest lat]
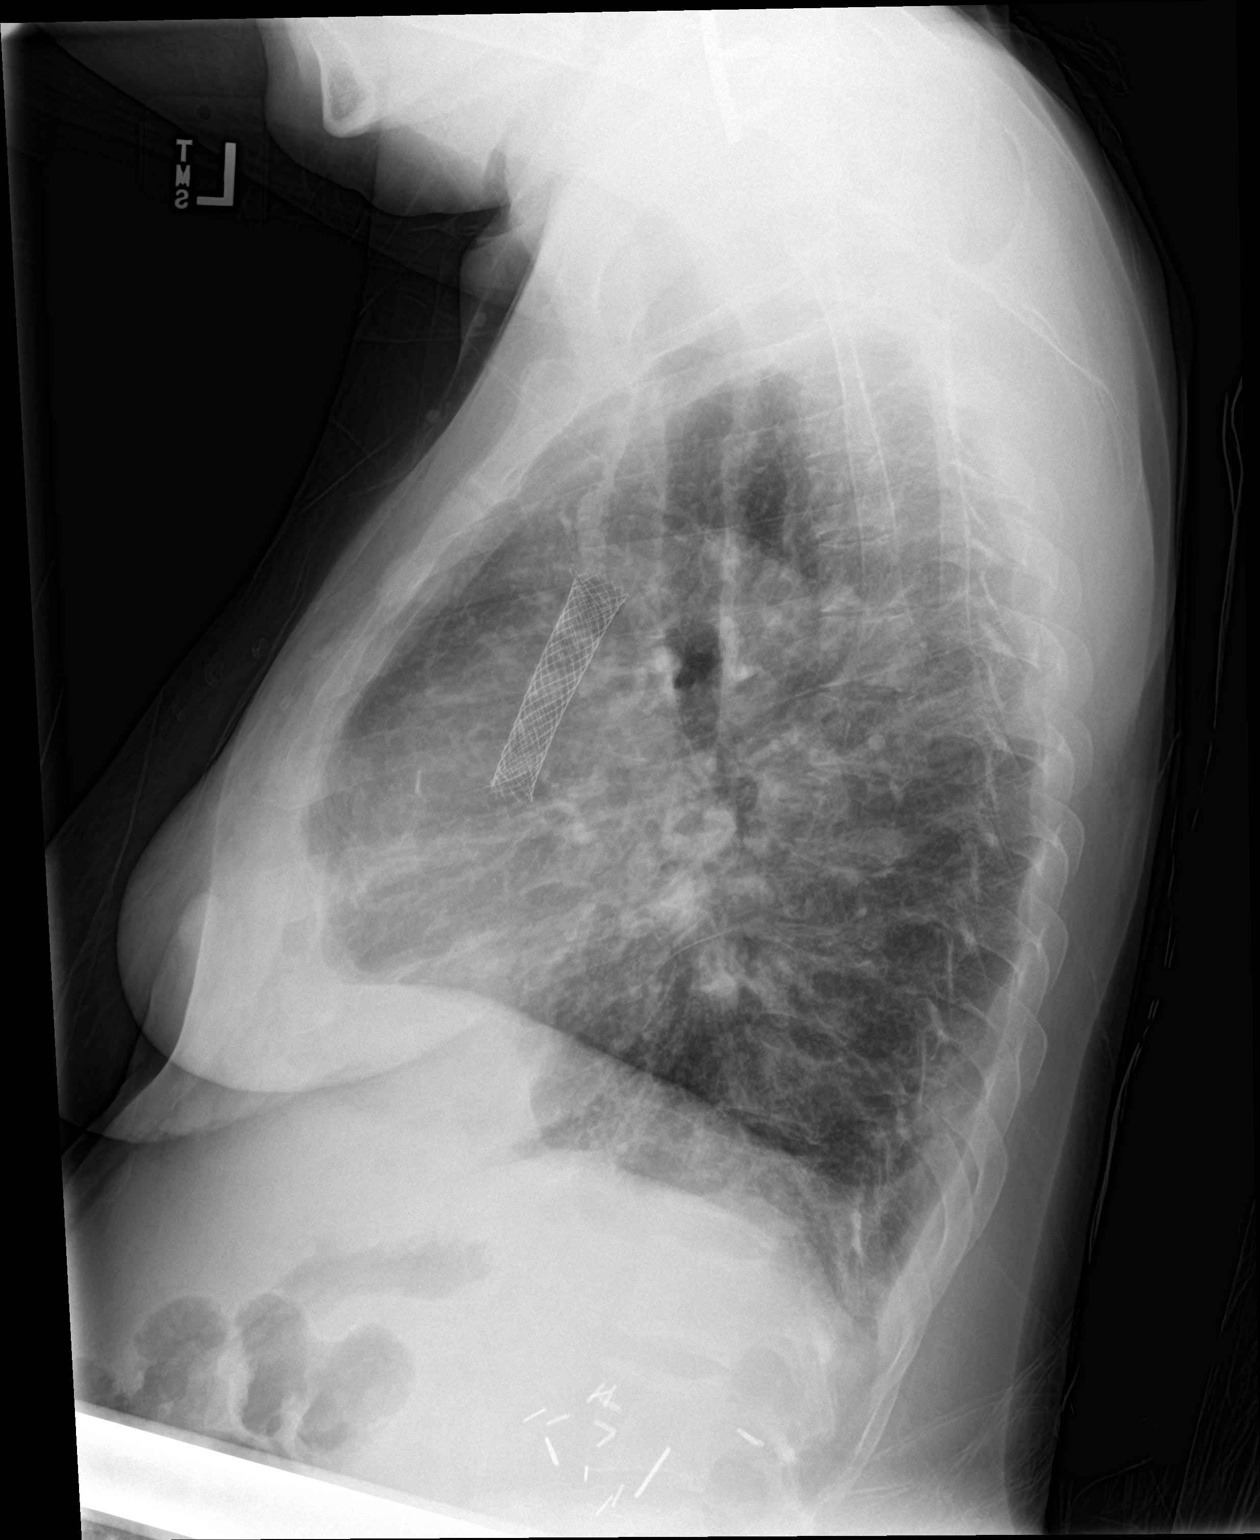

[2 of 2 positions shown; findings below may reference images not displayed]

FINDINGS: The heart size and mediastinal contours are within normal limits.
Stable appearance and positioning of SVC stent. Mildly increased
bibasilar interstitial markings. No pleural effusion or
pneumothorax. The visualized skeletal structures are unremarkable.
IMPRESSION: Mildly increased bibasilar interstitial markings, which may reflect
atelectasis versus atypical infection.

## 2023-06-14 ENCOUNTER — Ambulatory Visit: Payer: Medicare Other | Admitting: Radiation Oncology

## 2023-06-17 IMAGING — CT CT CHEST-ABD-PELV W/ CM
2 of 5 series · 12 of 46 positions shown, 14 images · IV contrast (agent unspecified)
Comparison: Current and prior chest radiographs. CT abdomen pelvis,
03/28/2020.

CLINICAL DATA: Pt states she is currently on doxycycline for
pneumonia. Pt reports after taking it, she has been experiencing
abdominal pain, nausea, and vomiting. Pt also c/o ongoing chest pain
and sob since [REDACTED].

EXAM:
CT CHEST, ABDOMEN, AND PELVIS WITH CONTRAST
TECHNIQUE: Multidetector CT imaging of the chest, abdomen and pelvis was
performed following the standard protocol during bolus
administration of intravenous contrast.

[Series 3: cap with · axial · 0.65mm/px · z∈[-1180,-715]mm · 9 of 113 slices shown, 11 images]
[im 10/113  soft-tissue]
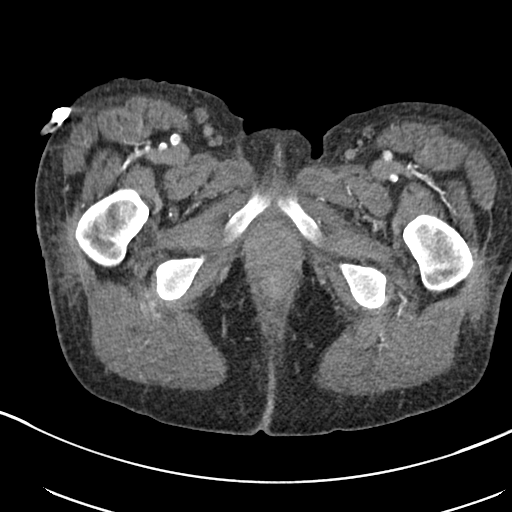
[im 10/113  bone]
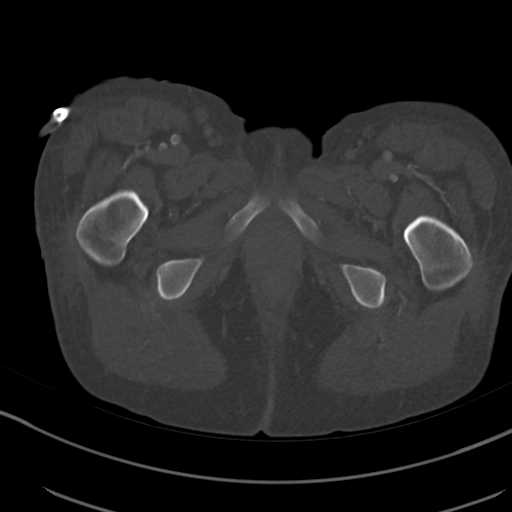
[im 19/113  soft-tissue]
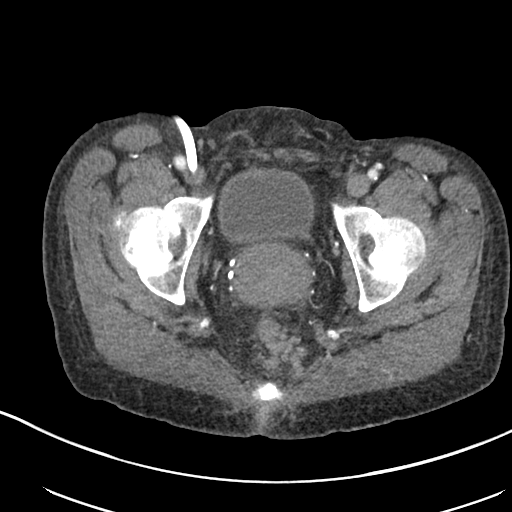
[im 38/113  soft-tissue]
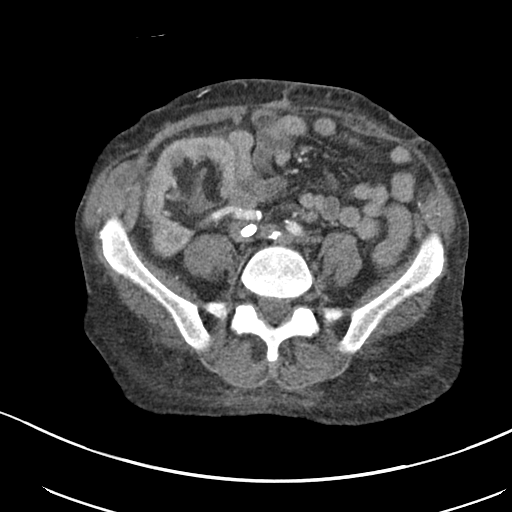
[im 47/113  soft-tissue]
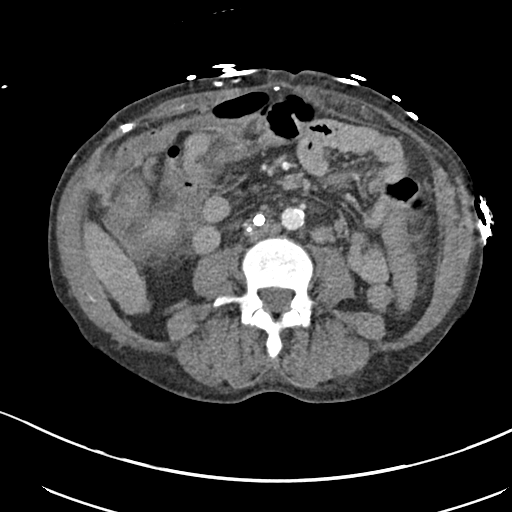
[im 57/113  soft-tissue]
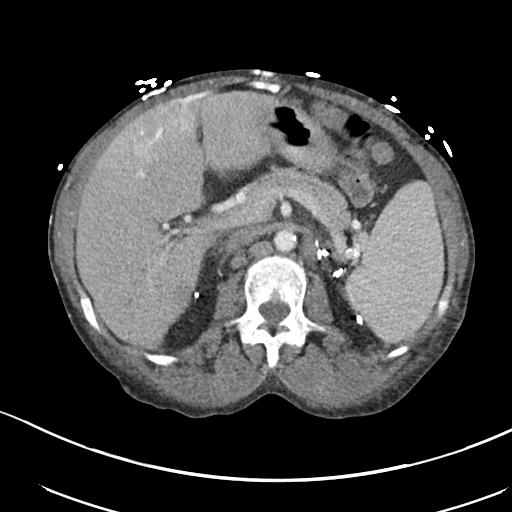
[im 66/113  soft-tissue]
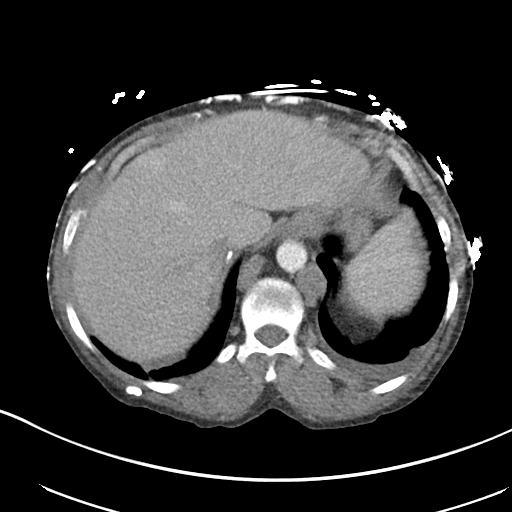
[im 75/113  soft-tissue]
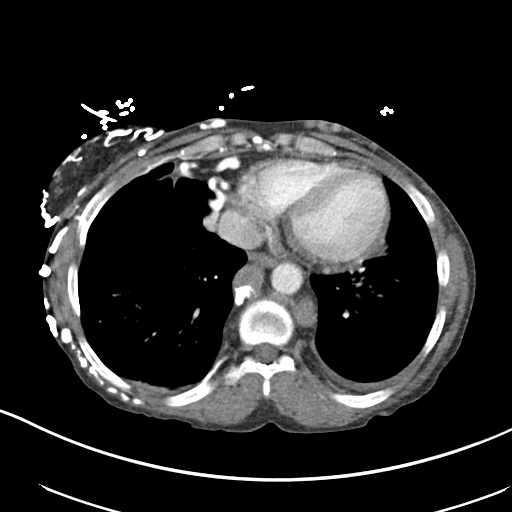
[im 94/113  soft-tissue]
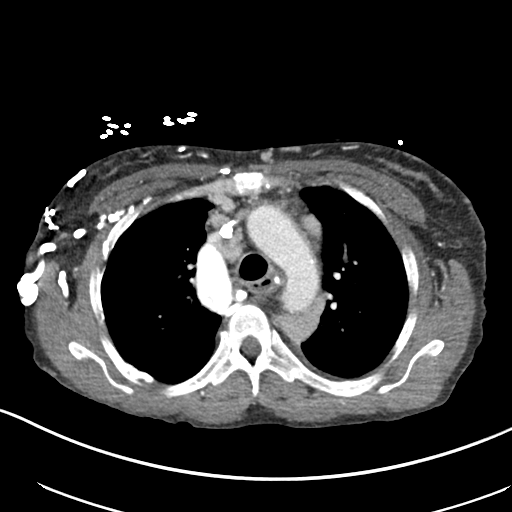
[im 103/113  soft-tissue]
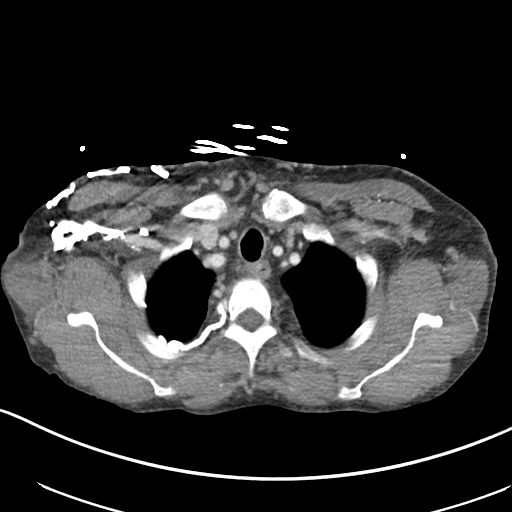
[im 103/113  bone]
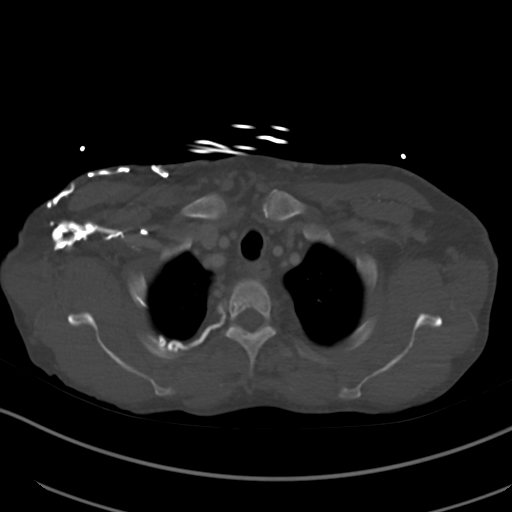

[Series 6: cor · coronal · 0.60mm/px · 3 of 90 slices shown]
[im 30/90  soft-tissue]
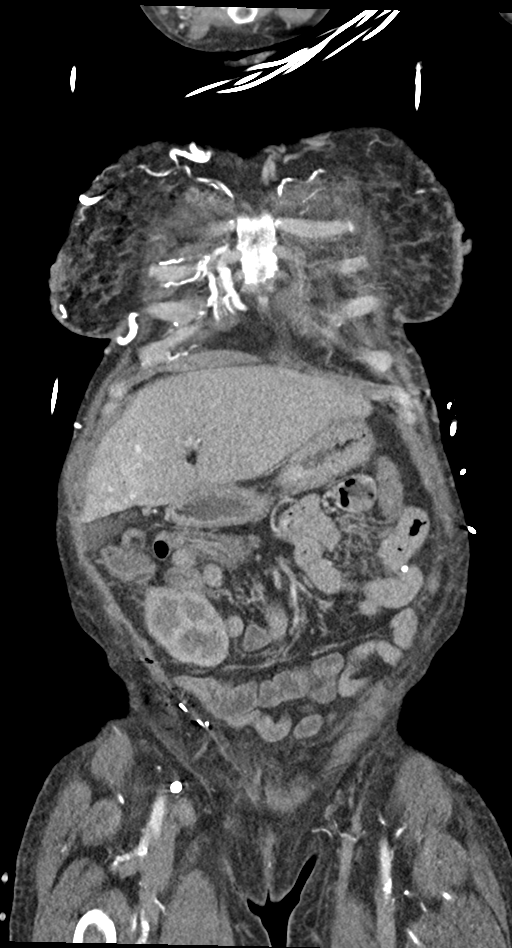
[im 40/90  soft-tissue]
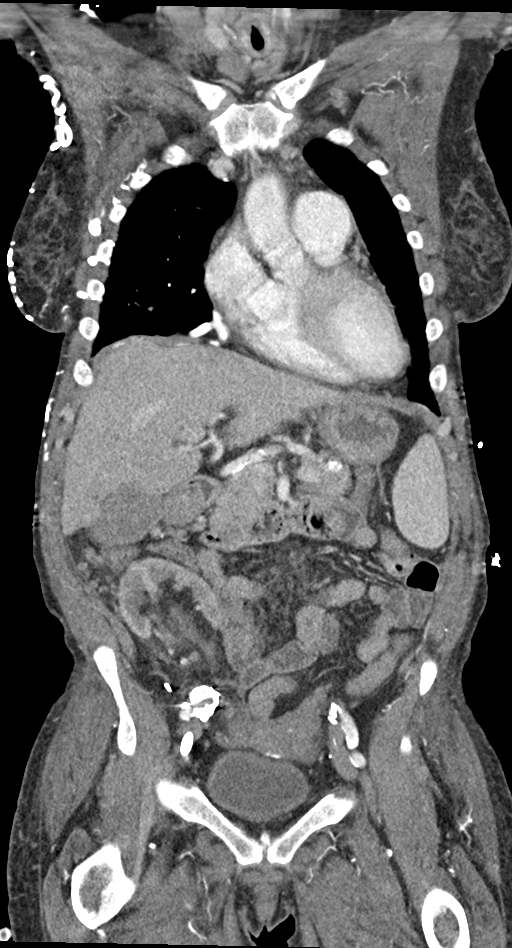
[im 50/90  soft-tissue]
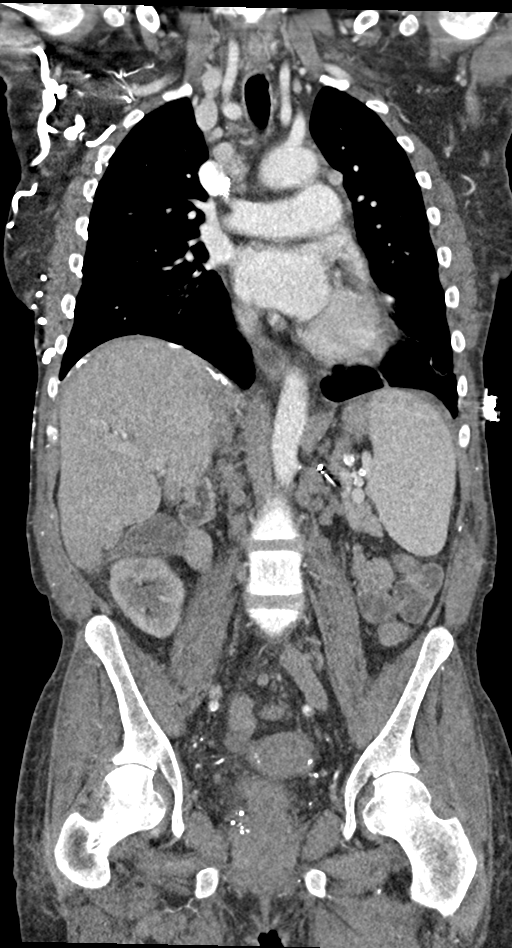

[12 of 46 positions shown; findings below may reference images not displayed]

RADIATION DOSE REDUCTION: This exam was performed according to the
departmental dose-optimization program which includes automated
exposure control, adjustment of the mA and/or kV according to
patient size and/or use of iterative reconstruction technique.

CONTRAST:  75mL OMNIPAQUE IOHEXOL 300 MG/ML  SOLN
FINDINGS: CT CHEST FINDINGS

Cardiovascular: Cardiac silhouette normal in size. Coronary artery
calcifications. No pericardial effusion. Dilated main pulmonary
artery to 3.7 cm. Normal caliber thoracic aorta. Mild thoracic
aortic atherosclerosis.

Mediastinum/Nodes: Prominent azygous/hemi azygous veins an
additional prominent collaterals along the mediastinum. No neck
base, mediastinal or hilar masses or enlarged lymph nodes. Trachea
and esophagus are unremarkable.

Lungs/Pleura: Minimal left pleural effusion. Patchy bilateral areas
of ground-glass opacity, left upper and lower lobes and right lower
and right middle lobes, sparing the right upper lobe. There are
additional irregular linear type opacities at the lung bases
consistent with scarring, atelectasis or a combination. In the right
middle lobe, this is associated with pleural based calcification.

No pneumothorax.

Musculoskeletal: Choose for lysed increased bone density consistent
with renal osteodystrophy. No fracture. No bone lesion.

There are multiple vascular collaterals and prominent intercostal
vessels along the chest wall, more notable on the right.

CT ABDOMEN PELVIS FINDINGS

Hepatobiliary: Liver normal in size. No convincing mass. Increased
attenuation noted inferiorly, centered on segment 4 B, which appears
to be due to vascular shunting from superficial vascular
collaterals. Gallbladder unremarkable. No bile duct dilation.

Pancreas: Unremarkable. No pancreatic ductal dilatation or
surrounding inflammatory changes.

Spleen: Top normal in size. No splenic mass or focal lesion. Stable
appearance is.

Adrenals/Urinary Tract: No adrenal masses. Previous resection of the
native kidneys. Transplant kidney in the right renal fossa with mild
cortical thinning, but no mass and no hydronephrosis. Transplant
kidney demonstrates enhancement, but delayed excretion. Bladder
mildly distended and unremarkable.

Stomach/Bowel: Normal stomach. Small bowel and colon are normal in
caliber. No wall thickening. No convincing inflammation.

Vascular/Lymphatic: Aortic and branch vessel atherosclerosis. No
aneurysm. Femoral a placed large caliber catheter extends into the
inferior vena cava below the level of the renal veins, unchanged.
There are distended mesenteric veins. Collateral vessels are also
noted along the retroperitoneum to the left of the aorta. No
enlarged lymph nodes.

Reproductive: Uterus and bilateral adnexa are unremarkable.

Other: No ascites.

Musculoskeletal: Overall increased skeletal density consistent with
renal osteodystrophy. Chronic avascular necrosis of the right
femoral head. No bone lesion.
IMPRESSION: 1. Patchy areas of ground-glass opacity, more evident on the left,
sparing the right upper lobe, consistent with multifocal pneumonia.
Atypical etiologies including viral/[id number] should be considered in
the differential diagnosis.
2. Trace left pleural effusion.
3. Dilated main pulmonary artery suggesting pulmonary artery
hypertension.
4. Multiple vascular collaterals along the chest wall, within the
mediastinum, along the abdominal wall, peritoneal cavity and
retroperitoneum, with dilated azygous and hemi azygous vessels. This
is stable from the prior CT.
5. Stable changes of renal failure including a right iliac fossa
transplant kidney.
6. No convincing acute finding within the abdomen or pelvis.

## 2023-06-17 IMAGING — CR DG CHEST 2V
2 series · 2 of 2 positions shown · non-contrast
Comparison: 03/26/2021

CLINICAL DATA: Pneumonia

EXAM:
CHEST - 2 VIEW

[chest lat]
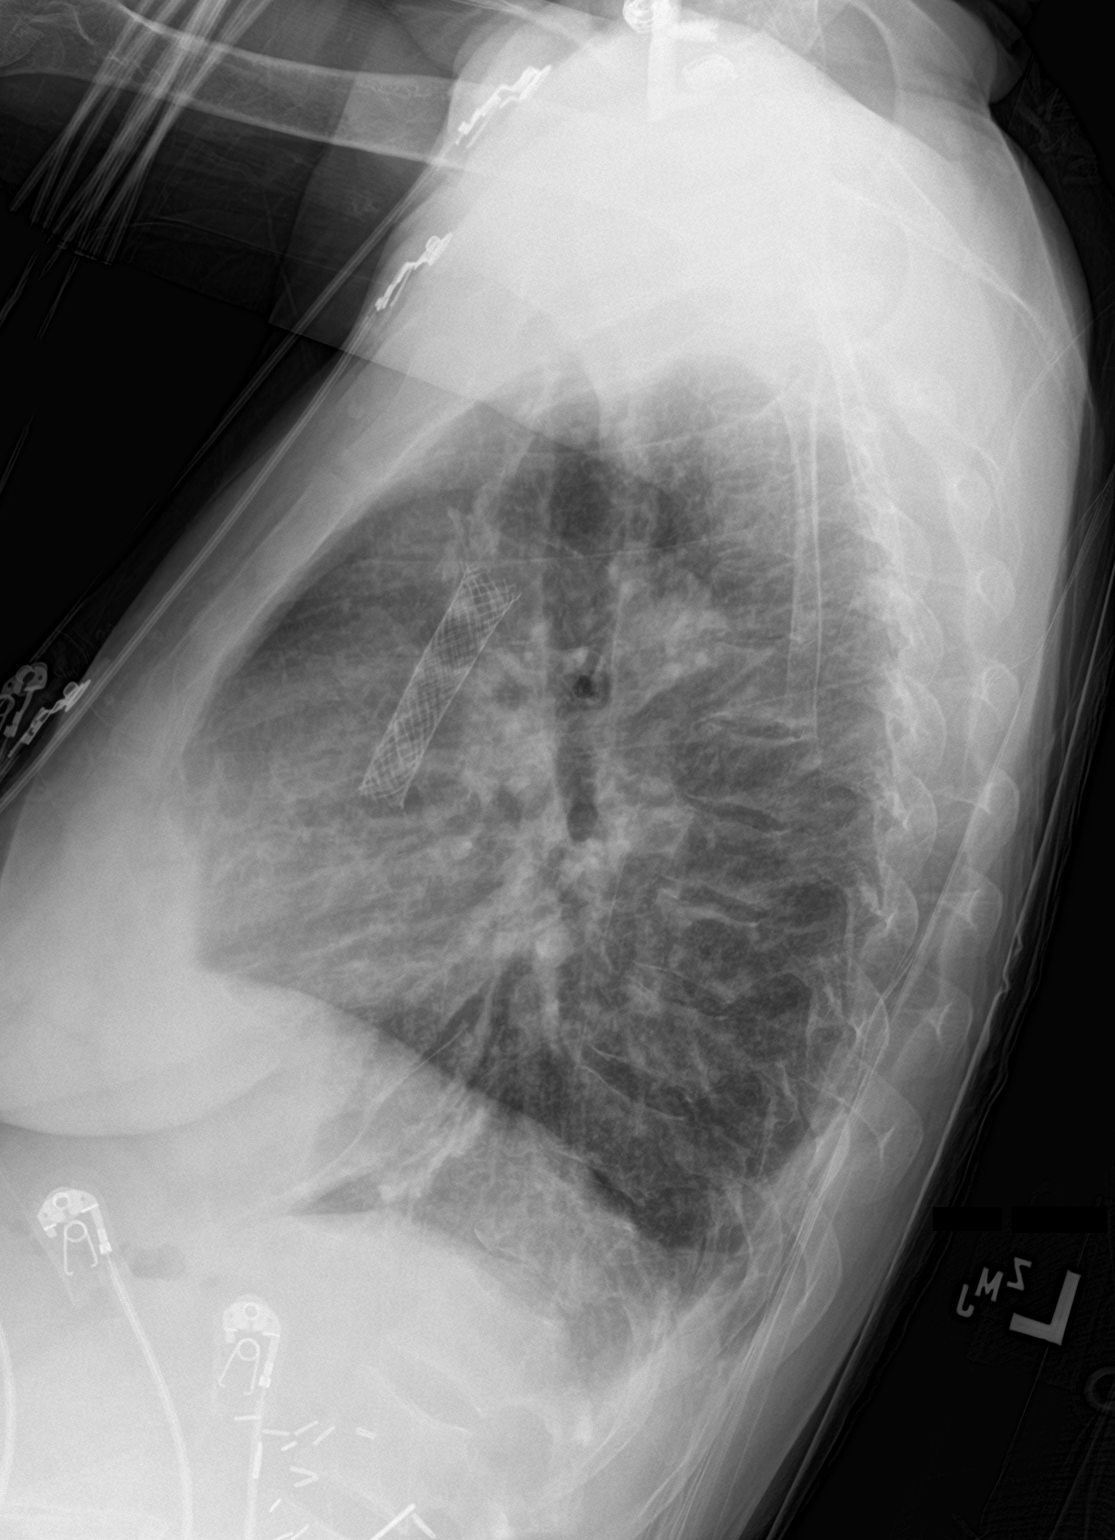

[chest ap]
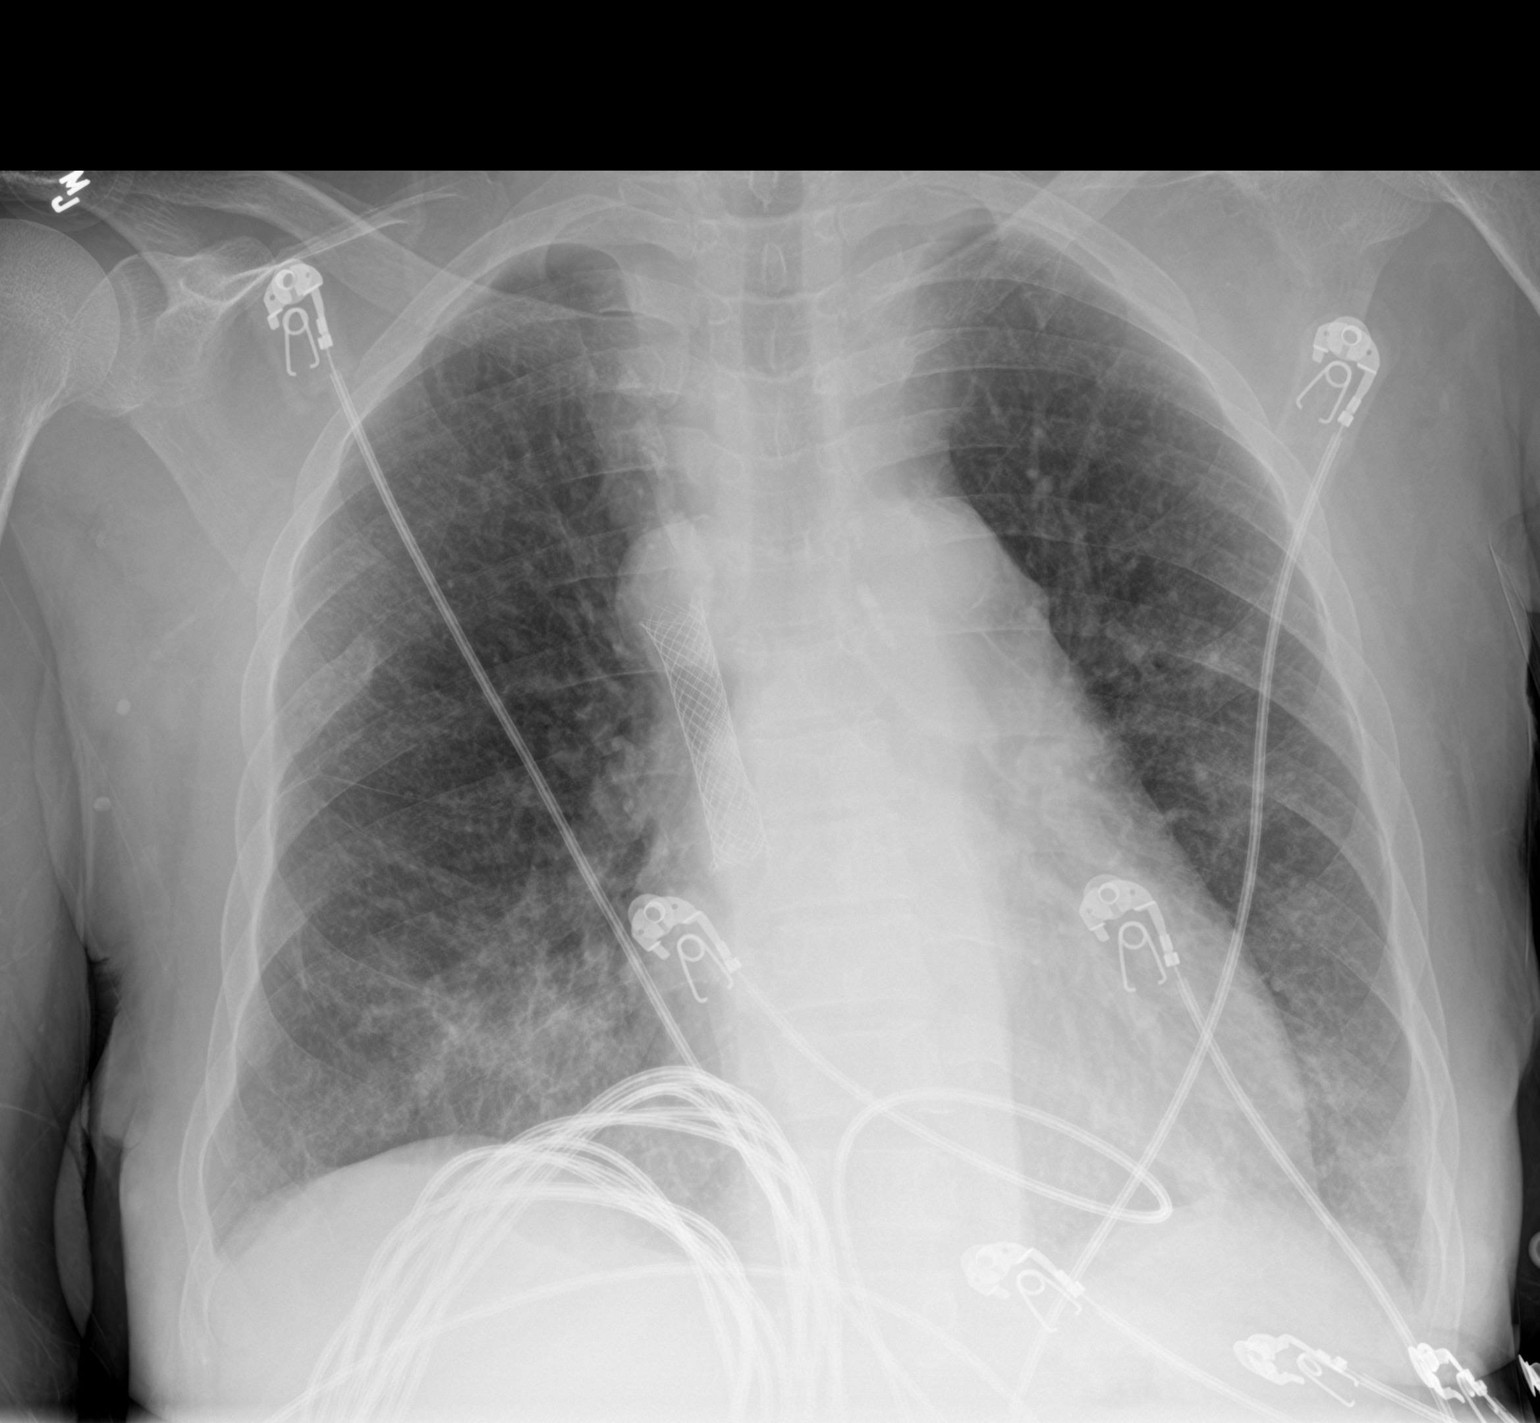

[2 of 2 positions shown; findings below may reference images not displayed]

FINDINGS: Transverse diameter of heart is in the upper limits of normal. There
is vascular stent in the course of superior vena cava. Patchy
increased interstitial markings in both lower lung fields have not
changed significantly. There is minimal blunting of left lateral CP
angle. There is no pneumothorax.
IMPRESSION: Increased markings in both lower lung fields may suggest
atelectasis/pneumonia with no significant interval change. Possible
minimal left pleural effusion.

## 2023-07-21 ENCOUNTER — Ambulatory Visit (HOSPITAL_COMMUNITY): Payer: Medicare Other | Admitting: Internal Medicine

## 2023-08-10 IMAGING — MG MM DIGITAL SCREENING BILAT W/ TOMO AND CAD
8 series · 9 of 24 positions shown · non-contrast
Comparison: Previous exam(s).

CLINICAL DATA: Screening.

EXAM:
DIGITAL SCREENING BILATERAL MAMMOGRAM WITH TOMOSYNTHESIS AND CAD
TECHNIQUE: Bilateral screening digital craniocaudal and mediolateral oblique
mammograms were obtained. Bilateral screening digital breast
tomosynthesis was performed. The images were evaluated with
computer-aided detection.

[R MLO synth-2D]
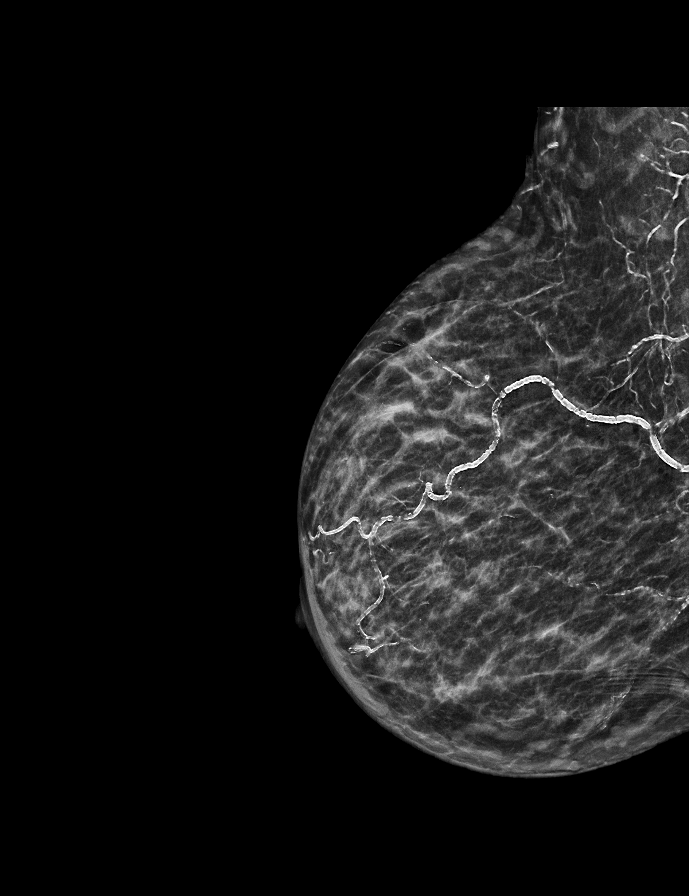

[L CC synth-2D]
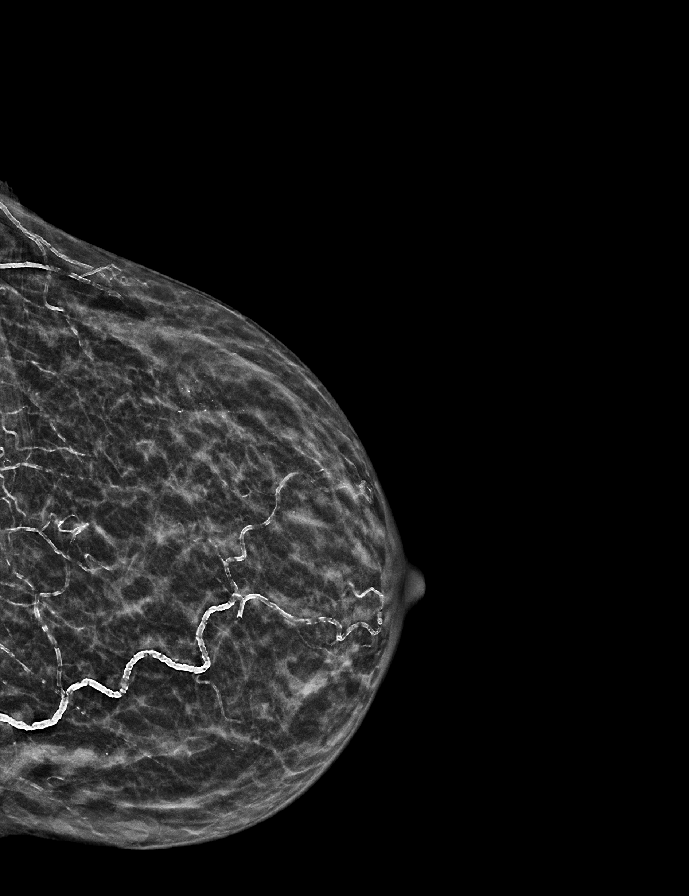

[L MLO synth-2D]
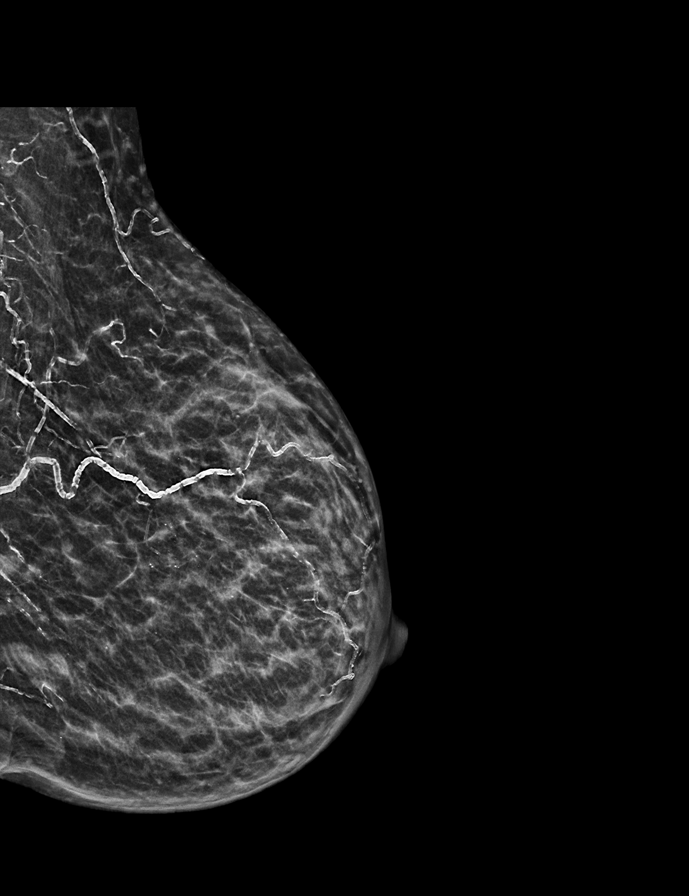

[R CC synth-2D]
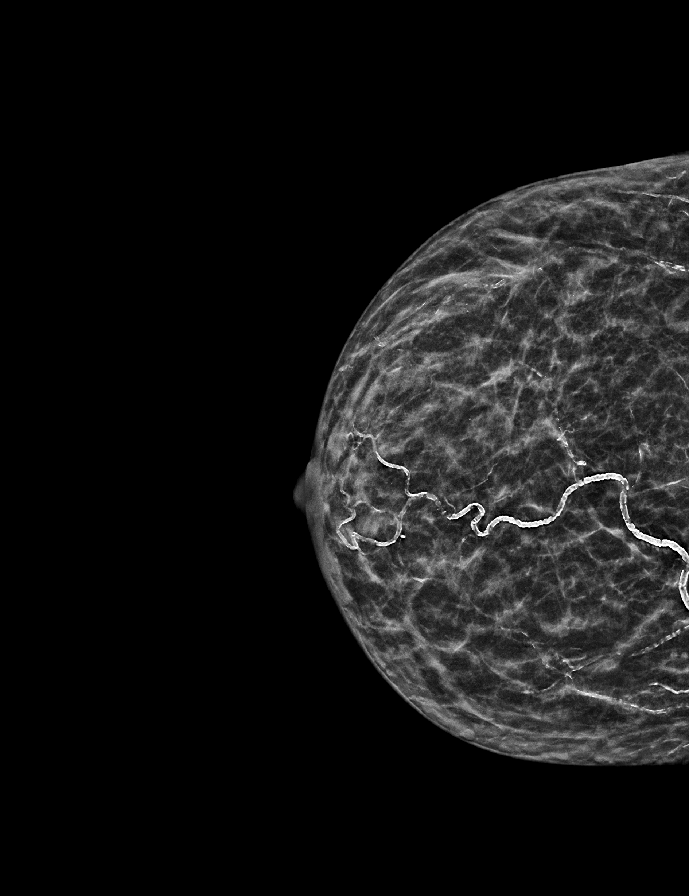

[L MLO tomo · 2 of 30 frames shown]
[frame 10/30]
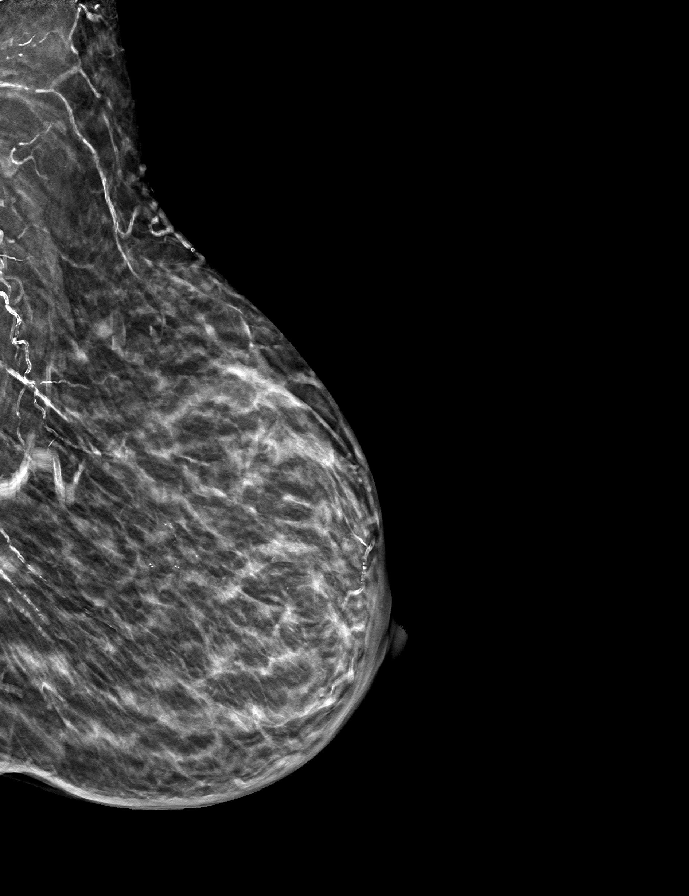
[frame 15/30]
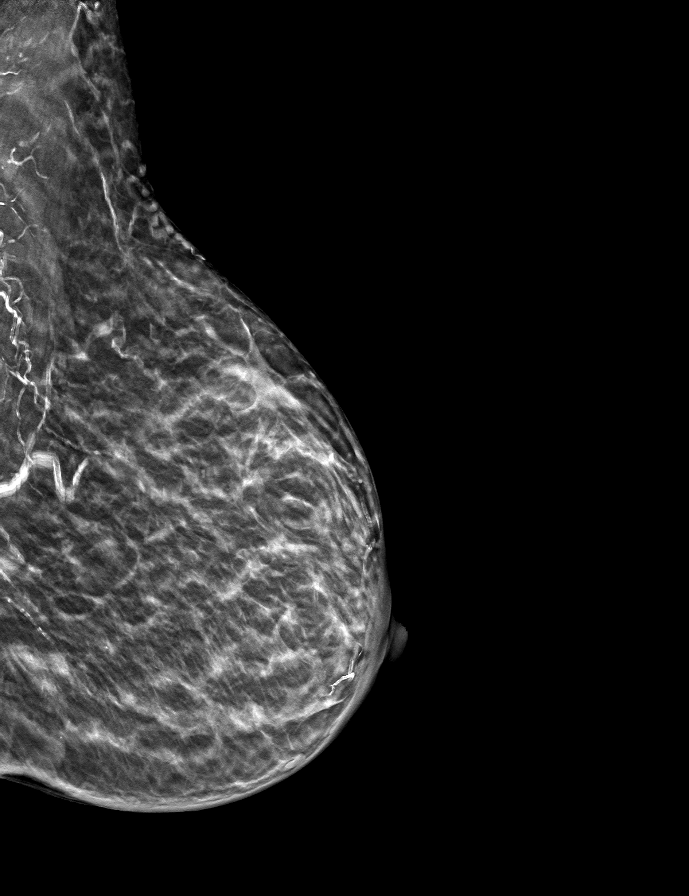

[L CC tomo · tomo slice 19/37.0]
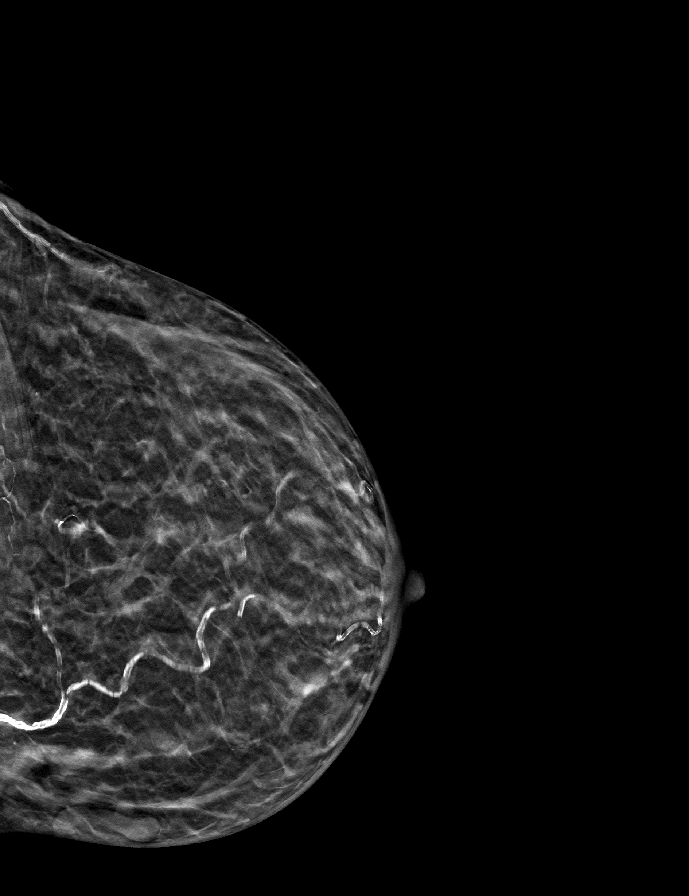

[R CC tomo · tomo slice 18/35.0]
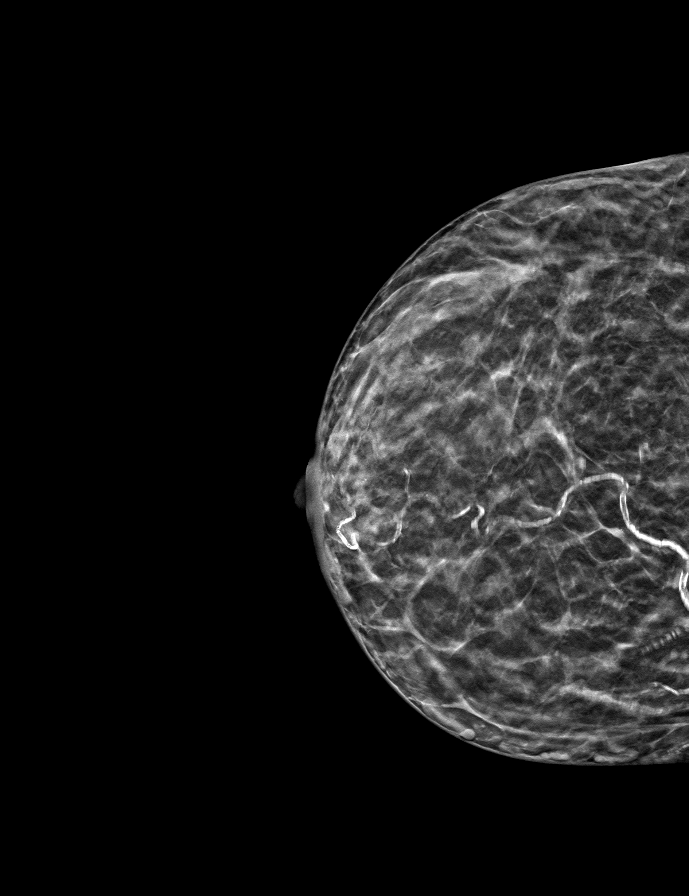

[R MLO tomo · tomo slice 14/27.0]
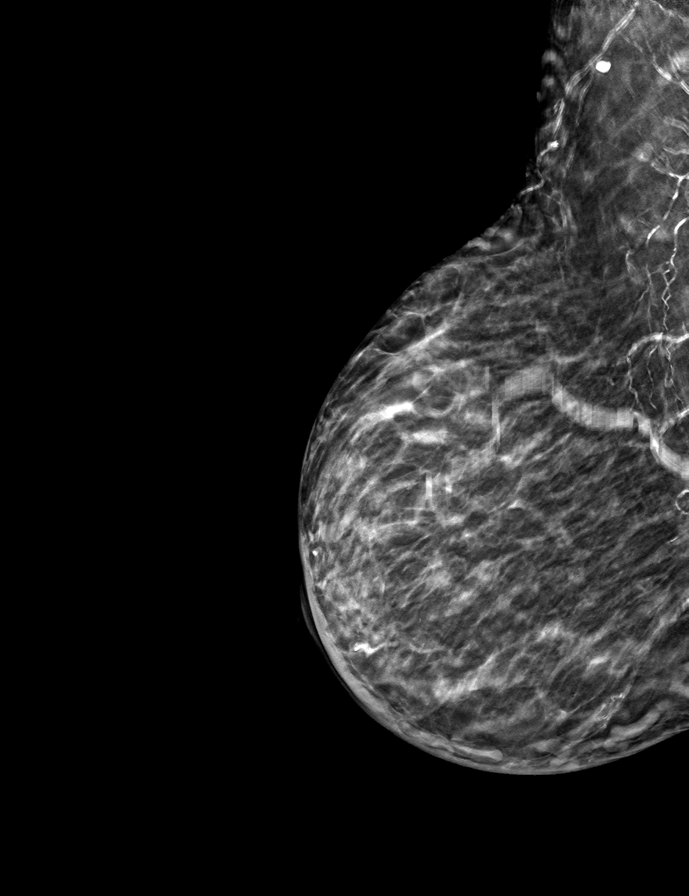

[9 of 24 positions shown; findings below may reference images not displayed]

ACR Breast Density Category c: The breast tissue is heterogeneously
dense, which may obscure small masses.
FINDINGS: There are no findings suspicious for malignancy.
IMPRESSION: No mammographic evidence of malignancy. A result letter of this
screening mammogram will be mailed directly to the patient.

RECOMMENDATION:
Screening mammogram in one year. (Code:Q3-W-BC3)

BI-RADS CATEGORY  1: Negative.
